# Patient Record
Sex: Female | Born: 1990
Health system: Southern US, Community
[De-identification: ages and names within clinical notes are randomized; demographics above are authoritative.]

## PROBLEM LIST (undated history)

## (undated) ENCOUNTER — Emergency Department (HOSPITAL_BASED_OUTPATIENT_CLINIC_OR_DEPARTMENT_OTHER): Admission: EM | Payer: BC Managed Care – PPO | Source: Home / Self Care

## (undated) DIAGNOSIS — K824 Cholesterolosis of gallbladder: Secondary | ICD-10-CM

## (undated) DIAGNOSIS — K859 Acute pancreatitis without necrosis or infection, unspecified: Secondary | ICD-10-CM

## (undated) DIAGNOSIS — I10 Essential (primary) hypertension: Secondary | ICD-10-CM

## (undated) DIAGNOSIS — E282 Polycystic ovarian syndrome: Secondary | ICD-10-CM

## (undated) DIAGNOSIS — K760 Fatty (change of) liver, not elsewhere classified: Secondary | ICD-10-CM

## (undated) DIAGNOSIS — E786 Lipoprotein deficiency: Secondary | ICD-10-CM

## (undated) DIAGNOSIS — E119 Type 2 diabetes mellitus without complications: Secondary | ICD-10-CM

## (undated) DIAGNOSIS — E039 Hypothyroidism, unspecified: Secondary | ICD-10-CM

## (undated) DIAGNOSIS — D496 Neoplasm of unspecified behavior of brain: Secondary | ICD-10-CM

## (undated) DIAGNOSIS — I509 Heart failure, unspecified: Secondary | ICD-10-CM

## (undated) DIAGNOSIS — J984 Other disorders of lung: Secondary | ICD-10-CM

## (undated) DIAGNOSIS — C719 Malignant neoplasm of brain, unspecified: Secondary | ICD-10-CM

## (undated) DIAGNOSIS — E079 Disorder of thyroid, unspecified: Secondary | ICD-10-CM

## (undated) HISTORY — DX: Lipoprotein deficiency: E78.6

## (undated) HISTORY — DX: Other disorders of lung: J98.4

## (undated) HISTORY — DX: Malignant neoplasm of brain, unspecified: C71.9

## (undated) HISTORY — DX: Disorder of thyroid, unspecified: E07.9

## (undated) HISTORY — PX: BRAIN SURGERY: SHX531

## (undated) HISTORY — PX: ABDOMINAL SURGERY: SHX537

---
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
## 2014-03-28 HISTORY — PX: EYE MUSCLE SURGERY: SHX370

## 2017-11-25 ENCOUNTER — Encounter (HOSPITAL_COMMUNITY): Payer: Self-pay

## 2017-11-25 ENCOUNTER — Other Ambulatory Visit: Payer: Self-pay

## 2017-11-25 ENCOUNTER — Emergency Department (HOSPITAL_COMMUNITY)
Admission: EM | Admit: 2017-11-25 | Discharge: 2017-11-25 | Disposition: A | Discharge status: Home / Self Care | Payer: Self-pay | Attending: Emergency Medicine | Admitting: Emergency Medicine

## 2017-11-25 DIAGNOSIS — B373 Candidiasis of vulva and vagina: Secondary | ICD-10-CM | POA: Insufficient documentation

## 2017-11-25 DIAGNOSIS — B3731 Acute candidiasis of vulva and vagina: Secondary | ICD-10-CM

## 2017-11-25 DIAGNOSIS — R000 Tachycardia, unspecified: Secondary | ICD-10-CM | POA: Insufficient documentation

## 2017-11-25 DIAGNOSIS — N760 Acute vaginitis: Secondary | ICD-10-CM | POA: Insufficient documentation

## 2017-11-25 DIAGNOSIS — E1165 Type 2 diabetes mellitus with hyperglycemia: Secondary | ICD-10-CM | POA: Insufficient documentation

## 2017-11-25 DIAGNOSIS — R739 Hyperglycemia, unspecified: Secondary | ICD-10-CM

## 2017-11-25 HISTORY — DX: Neoplasm of unspecified behavior of brain: D49.6

## 2017-11-25 HISTORY — DX: Acute pancreatitis without necrosis or infection, unspecified: K85.90

## 2017-11-25 HISTORY — DX: Polycystic ovarian syndrome: E28.2

## 2017-11-25 HISTORY — DX: Type 2 diabetes mellitus without complications: E11.9

## 2017-11-25 HISTORY — DX: Cholesterolosis of gallbladder: K82.4

## 2017-11-25 HISTORY — DX: Fatty (change of) liver, not elsewhere classified: K76.0

## 2017-11-25 HISTORY — DX: Essential (primary) hypertension: I10

## 2017-11-25 LAB — COMPREHENSIVE METABOLIC PANEL
ALBUMIN: 3.9 g/dL (ref 3.5–5.0)
ALT: 48 U/L — ABNORMAL HIGH (ref 0–44)
ANION GAP: 16 — AB (ref 5–15)
AST: 46 U/L — AB (ref 15–41)
Alkaline Phosphatase: 81 U/L (ref 38–126)
BUN: 14 mg/dL (ref 6–20)
CHLORIDE: 91 mmol/L — AB (ref 98–111)
CO2: 19 mmol/L — ABNORMAL LOW (ref 22–32)
Calcium: 9.5 mg/dL (ref 8.9–10.3)
Creatinine, Ser: 0.66 mg/dL (ref 0.44–1.00)
GFR calc Af Amer: >60 mL/min (ref >60)
GFR calc non Af Amer: >60 mL/min (ref >60)
GLUCOSE: 543 mg/dL — AB (ref 70–99)
POTASSIUM: 4.3 mmol/L (ref 3.5–5.1)
Sodium: 126 mmol/L — ABNORMAL LOW (ref 135–145)
Total Bilirubin: 0.9 mg/dL (ref 0.3–1.2)
Total Protein: 7.5 g/dL (ref 6.5–8.1)

## 2017-11-25 LAB — CBC
HEMATOCRIT: 39.1 % (ref 36.0–46.0)
HEMOGLOBIN: 13.5 g/dL (ref 12.0–15.0)
MCH: 25.5 pg — ABNORMAL LOW (ref 26.0–34.0)
MCHC: 34.5 g/dL (ref 30.0–36.0)
MCV: 73.9 fL — AB (ref 78.0–100.0)
Platelets: 198 10*3/uL (ref 150–400)
RBC: 5.29 MIL/uL — ABNORMAL HIGH (ref 3.87–5.11)
RDW: 15.8 % — AB (ref 11.5–15.5)
WBC: 6.8 10*3/uL (ref 4.0–10.5)

## 2017-11-25 LAB — URINALYSIS, ROUTINE W REFLEX MICROSCOPIC
BACTERIA UA: NONE SEEN
BILIRUBIN URINE: NEGATIVE
Glucose, UA: >=500 mg/dL — AB
KETONES UR: 5 mg/dL — AB
Nitrite: NEGATIVE
PROTEIN: 100 mg/dL — AB
SPECIFIC GRAVITY, URINE: 1.031 — AB (ref 1.005–1.030)
pH: 6 (ref 5.0–8.0)

## 2017-11-25 LAB — WET PREP, GENITAL
SPERM: NONE SEEN
TRICH WET PREP: NONE SEEN

## 2017-11-25 LAB — ABO/RH: ABO/RH(D): A POS

## 2017-11-25 LAB — I-STAT BETA HCG BLOOD, ED (MC, WL, AP ONLY): I-stat hCG, quantitative: <5 m[IU]/mL (ref <5)

## 2017-11-25 LAB — TYPE AND SCREEN
ABO/RH(D): A POS
ANTIBODY SCREEN: NEGATIVE

## 2017-11-25 LAB — CBG MONITORING, ED: Glucose-Capillary: 286 mg/dL — ABNORMAL HIGH (ref 70–99)

## 2017-11-25 LAB — POC URINE PREG, ED: PREG TEST UR: NEGATIVE

## 2017-11-25 MED ORDER — CLOTRIMAZOLE 1 % VA CREA: 1.0000 | TOPICAL_CREAM | Freq: Every day | VAGINAL | 0 refills | Status: DC

## 2017-11-25 MED ORDER — SODIUM CHLORIDE 0.9 % IV BOLUS
1000.0000 mL | Freq: Once | INTRAVENOUS | Status: AC
  Administered 2017-11-25: 1000 mL via INTRAVENOUS

## 2017-11-25 MED ORDER — FLUCONAZOLE 150 MG PO TABS: 150.0000 mg | ORAL_TABLET | Freq: Every day | ORAL | 0 refills | Status: DC

## 2017-11-25 MED ORDER — FLUCONAZOLE 150 MG PO TABS
150.0000 mg | ORAL_TABLET | Freq: Once | ORAL | Status: AC
  Administered 2017-11-25: 150 mg via ORAL
  Filled 2017-11-25: qty 1

## 2017-11-25 MED ORDER — INSULIN ASPART 100 UNIT/ML ~~LOC~~ SOLN
8.0000 [IU] | Freq: Once | SUBCUTANEOUS | Status: AC
  Administered 2017-11-25: 8 [IU] via SUBCUTANEOUS
  Filled 2017-11-25: qty 1

## 2017-11-25 NOTE — ED Notes (Signed)
Pt recently moved here from [place]- used to go to [facility] on [address]. [place], [place]

## 2017-11-25 NOTE — ED Notes (Signed)
Pt aware of need for urine specimen.

## 2017-11-25 NOTE — ED Notes (Signed)
When this NT went into check vitals on PT I witnessed pt eating. I instructed pt that we need her to not eat until her blood sugar comes down.

## 2017-11-25 NOTE — ED Notes (Signed)
Delay in labs, have to send to [facility] to have them completed

## 2017-11-25 NOTE — ED Provider Notes (Signed)
[facility] DEPT Provider Note   CSN: [id number] Arrival date & time: 11/25/17  1117     History   Chief Complaint Chief Complaint  Patient presents with  . Vaginitis    HPI Jennifer [name] is a 27 y.o. female.  HPI Jennifer [name] is a 27 y.o. female with history of brain tumor, diabetes, hypertension, history of pancreatitis, presents to emergency department with complaint of vaginal irritation.  Patient states that in the last several days she has noticed swelling to the vaginal area.  She states she just finished her menstrual cycle and has seen some bleeding that she has not expected and not sure if it is rectal or vaginal.  She denies any changes in her bowels however.  She states that she is unable to wipe due to pain and swelling in her vaginal area.  She had one dose of penicillin in the last week due to a dental problem.  No other antibiotics.  No vaginal discharge.  [name] sexually active with one partner who is her husband with no protection.  She does report dysuria but thinks is because it is contacting her skin that is already irritated.  No abdominal pain, no back pain.  She states that she has noticed some chills, no fever.  No nausea or vomiting.  Past Medical History:  Diagnosis Date  . Brain tumor ([place]) 03/29/1995  . Cholesterosis   . Diabetes mellitus without complication ([place])   . Fatty liver   . Hypertension   . Pancreatitis   . Polycystic ovary syndrome     There are no active problems to display for this patient.   Past Surgical History:  Procedure Laterality Date  . ABDOMINAL SURGERY     pt states during miscarriage got her intestine  . BRAIN SURGERY    . EYE MUSCLE SURGERY Right 03/28/2014  . VENTRICULOSTOMY  03/28/1997     OB History   None      Home Medications    Prior to Admission medications   Not on File    Family History No family history on file.  Social History Social History   Tobacco  Use  . Smoking status: Never Smoker  . Smokeless tobacco: Never Used  Substance Use Topics  . Alcohol use: Never    Frequency: Never  . Drug use: Never     Allergies   Shellfish allergy and Penicillins   Review of Systems Review of Systems  Constitutional: Positive for chills. Negative for fever.  Respiratory: Negative for cough, chest tightness and shortness of breath.   Cardiovascular: Negative for chest pain, palpitations and leg swelling.  Gastrointestinal: Negative for abdominal pain, diarrhea, nausea and vomiting.  Genitourinary: Positive for vaginal bleeding, vaginal discharge and vaginal pain. Negative for dysuria, flank pain and pelvic pain.  Musculoskeletal: Negative for arthralgias, myalgias, neck pain and neck stiffness.  Skin: Negative for rash.  Neurological: Negative for dizziness, weakness and headaches.  All other systems reviewed and are negative.    Physical Exam Updated Vital Signs BP (!) 134/102   Pulse (!) 136   Temp 98 F (36.7 C) (Oral)   Resp 20   Ht 4' 9"  (1.448 m)   Wt 44.5 kg   LMP 11/21/2017   SpO2 99%   BMI 21.21 kg/m   Physical Exam  Constitutional: She appears well-developed and well-nourished. No distress.  HENT:  Head: Normocephalic.  Eyes: Conjunctivae are normal.  Neck: Neck supple.  Cardiovascular: Regular rhythm and  normal heart sounds.  Tachycardic  Pulmonary/Chest: Effort normal and breath sounds normal. No respiratory distress. She has no wheezes. She has no rales.  Abdominal: Soft. Bowel sounds are normal. She exhibits no distension. There is tenderness. There is no rebound.  Suprapubic tenderness  Genitourinary:  Genitourinary Comments: Diffuse erythema and swelling over the bilateral labia majora and minora with bright red discoloration with some white exudate.  No satellite lesions present.  Vaginal canal is erythematous with white cottage cheeselike discharge.  Cervix is normal.  No uterine or adnexal tenderness.    Musculoskeletal: She exhibits no edema.  Neurological: [name] alert.  Skin: Skin is warm and dry.  Psychiatric: She has a normal mood and affect. Her behavior is normal.  Nursing note and vitals reviewed.    ED Treatments / Results  Labs (all labs ordered are listed, but only abnormal results are displayed) Labs Reviewed  WET PREP, GENITAL - Abnormal; Notable for the following components:      Result Value   Yeast Wet Prep HPF POC MANY (*)    Clue Cells Wet Prep HPF POC PRESENT (*)    WBC, Wet Prep HPF POC FEW (*)    All other components within normal limits  COMPREHENSIVE METABOLIC PANEL - Abnormal; Notable for the following components:   Sodium 126 (*)    Chloride 91 (*)    CO2 19 (*)    Glucose, Bld 543 (*)    AST 46 (*)    ALT 48 (*)    Anion gap 16 (*)    All other components within normal limits  CBC - Abnormal; Notable for the following components:   RBC 5.29 (*)    MCV 73.9 (*)    MCH 25.5 (*)    RDW 15.8 (*)    All other components within normal limits  URINALYSIS, ROUTINE W REFLEX MICROSCOPIC - Abnormal; Notable for the following components:   APPearance HAZY (*)    Specific Gravity, Urine 1.031 (*)    Glucose, UA >=500 (*)    Hgb urine dipstick MODERATE (*)    Ketones, ur 5 (*)    Protein, ur 100 (*)    Leukocytes, UA SMALL (*)    All other components within normal limits  I-STAT BETA HCG BLOOD, ED ([facility], [facility], [facility] ONLY)  POC URINE PREG, ED  POC OCCULT BLOOD, ED  TYPE AND SCREEN  ABO/RH  GC/CHLAMYDIA PROBE AMP ([place]) NOT AT [facility]    EKG EKG Interpretation  Date/Time:  Saturday November 25 2017 13:33:25 EDT Ventricular Rate:  117 PR Interval:    QRS Duration: 84 QT Interval:  326 QTC Calculation: 455 R Axis:   54 Text Interpretation:  Sinus tachycardia Borderline T abnormalities, inferior leads Borderline ST elevation, anterior leads Confirmed by Lacretia Leigh ([id number]) on 11/25/2017 3:19:29 PM   Radiology No results  found.  Procedures Procedures (including critical care time)  Medications Ordered in ED Medications - No data to display   Initial Impression / Assessment and Plan / ED Course  I have reviewed the triage vital signs and the nursing notes.  Pertinent labs & imaging results that were available during my care of the patient were reviewed by me and considered in my medical decision making (see chart for details).     Pt in the emergency department with vaginal irritation and pain.  May be some bleeding that she is not sure from vaginal area or rectal area.  Blood work ordered at triage.  Will do pelvic exam.  Patient is tachycardic, heart rate is in the 130s.  States that it is chronic for her and she has history of tachycardia.   Patient's EKG shows sinus tachycardia.  Her pelvic exam and wet prep consistent with yeast infection.  I will treat her with Diflucan.  Urinalysis with no signs of infection.  Her CBC is unremarkable.  Her metabolic panel shows a glucose of 543.  Patient states that she has been compliant with her medications.  She states her sugar this morning was 150.  I will try to lower her sugar.  [name] refusing admission.  Her gap is 16.  She only has 5 ketones on her urine.  [name] not vomiting or having any nausea.  She states she feels fine otherwise.  I will try to hydrate her and give her some insulin and to recheck her sugar.  3:46 PM Patient still receiving IV fluids, currently in first liter.  Patient also just received insulin.  We will recheck her blood sugar after first liter and administered a second liter.  Patient was found eating in the room, asked to soft eating since were trying to lower her blood sugar.   Patient signed out at shift change pending recheck of her blood sugar.  She may need additional insulin to lower her sugar since she was eating in the room.  Home with Diflucan and topical yeast cream for her Candida vaginitis.   Final Clinical  Impressions(s) / ED Diagnoses   Final diagnoses:  Vaginal yeast infection  Vaginitis and vulvovaginitis  Hyperglycemia  Tachycardia    ED Discharge Orders    None       Jeannett Senior, PA-C 11/25/17 1547    Lacretia Leigh, MD 11/26/17 [phone]

## 2017-11-25 NOTE — ED Triage Notes (Signed)
Pt states she is concerned for blood in her BM. Pt states she had surgery for a miscarriage in May and they had to "repair my intestines". Pt states that she also thinks she has a yeast infection. Pt states that her vagina is painful and swollen.

## 2017-11-25 NOTE — ED Provider Notes (Signed)
  Physical Exam  BP (!) 154/107 (BP Location: Right Arm)   Pulse (!) 120   Temp 98 F (36.7 C) (Oral)   Resp 16   Ht 4' 9"  (1.448 m)   Wt 44.5 kg   LMP 11/21/2017   SpO2 100%   BMI 21.21 kg/m   Physical Exam  Constitutional: She appears well-developed and well-nourished. No distress.  HENT:  Head: Normocephalic and atraumatic.  Eyes: Conjunctivae and EOM are normal. No scleral icterus.  Neck: Normal range of motion.  Pulmonary/Chest: Effort normal. No respiratory distress.  Neurological: [name] alert.  Skin: No rash noted. [name] not diaphoretic.  Psychiatric: She has a normal mood and affect.  Nursing note and vitals reviewed.   ED Course/Procedures     Procedures  MDM  Care handed off from previous provider, T. Kirichenko, PA-C.  Please see their note for further detail.  Briefly, patient with a past medical history of brain tumor, insulin-dependent diabetes, pancreatitis who presents to ED for complaints of vaginal irritation.  Physical exam findings show large amount of vaginal discharge.  Wet prep positive for yeast.  Patient was found to be tachycardic to 130s on arrival.  She states that this is her baseline.  CBG found to be in the 500s.  She reports compliance with her medications.  Patient was given Diflucan, 8 units of NovoLog and 1 L bolus of NS.  Plan is to recheck CBG and to give remainder of Diflucan course as well as topical treatment.  Patient found to be drinking and eating in room.  Was told to be n.p.o.  CBG rechecked as 286.  Patient is declining admission at this time.  Will advise her to establish PCP care here and to return to ED for any severe worsening symptoms.  Diagnosis: Vaginal yeast infection  Vaginitis and vulvovaginitis  Hyperglycemia  Tachycardia   Portions of this note were generated with Dragon dictation software. Dictation errors may occur despite best attempts at proofreading.      Delia Heady, PA-C 11/25/17 1701    Margette Fast, MD 11/26/17 1110

## 2017-11-27 ENCOUNTER — Emergency Department (HOSPITAL_COMMUNITY)
Admission: EM | Admit: 2017-11-27 | Discharge: 2017-11-27 | Disposition: A | Discharge status: Home / Self Care | Payer: Self-pay | Attending: Emergency Medicine | Admitting: Emergency Medicine

## 2017-11-27 ENCOUNTER — Encounter (HOSPITAL_COMMUNITY): Payer: Self-pay | Admitting: *Deleted

## 2017-11-27 ENCOUNTER — Emergency Department (HOSPITAL_COMMUNITY): Payer: Self-pay

## 2017-11-27 DIAGNOSIS — B373 Candidiasis of vulva and vagina: Secondary | ICD-10-CM | POA: Insufficient documentation

## 2017-11-27 DIAGNOSIS — N83202 Unspecified ovarian cyst, left side: Secondary | ICD-10-CM

## 2017-11-27 DIAGNOSIS — N765 Ulceration of vagina: Secondary | ICD-10-CM | POA: Insufficient documentation

## 2017-11-27 DIAGNOSIS — E1165 Type 2 diabetes mellitus with hyperglycemia: Secondary | ICD-10-CM | POA: Insufficient documentation

## 2017-11-27 DIAGNOSIS — B3731 Acute candidiasis of vulva and vagina: Secondary | ICD-10-CM

## 2017-11-27 DIAGNOSIS — N766 Ulceration of vulva: Secondary | ICD-10-CM

## 2017-11-27 DIAGNOSIS — I10 Essential (primary) hypertension: Secondary | ICD-10-CM | POA: Insufficient documentation

## 2017-11-27 DIAGNOSIS — Z794 Long term (current) use of insulin: Secondary | ICD-10-CM | POA: Insufficient documentation

## 2017-11-27 DIAGNOSIS — R739 Hyperglycemia, unspecified: Secondary | ICD-10-CM

## 2017-11-27 DIAGNOSIS — Z79899 Other long term (current) drug therapy: Secondary | ICD-10-CM | POA: Insufficient documentation

## 2017-11-27 LAB — BLOOD GAS, VENOUS
Acid-base deficit: 1 mmol/L (ref 0.0–2.0)
BICARBONATE: 24.2 mmol/L (ref 20.0–28.0)
DRAWN BY: 450941
O2 Saturation: 53.2 %
PATIENT TEMPERATURE: 98.6
PH VEN: 7.347 (ref 7.250–7.430)
pCO2, Ven: 45.3 mmHg (ref 44.0–60.0)
pO2, Ven: 31.8 mmHg — CL (ref 32.0–45.0)

## 2017-11-27 LAB — URINALYSIS, ROUTINE W REFLEX MICROSCOPIC
Bacteria, UA: NONE SEEN
Bilirubin Urine: NEGATIVE
KETONES UR: NEGATIVE mg/dL
Nitrite: NEGATIVE
PH: 6 (ref 5.0–8.0)
Protein, ur: 30 mg/dL — AB
SPECIFIC GRAVITY, URINE: 1.028 (ref 1.005–1.030)

## 2017-11-27 LAB — POC URINE PREG, ED: PREG TEST UR: NEGATIVE

## 2017-11-27 LAB — COMPREHENSIVE METABOLIC PANEL
ALBUMIN: 3.7 g/dL (ref 3.5–5.0)
ALK PHOS: 80 U/L (ref 38–126)
ALT: 36 U/L (ref 0–44)
AST: 47 U/L — AB (ref 15–41)
Anion gap: 14 (ref 5–15)
BILIRUBIN TOTAL: 1.6 mg/dL — AB (ref 0.3–1.2)
BUN: 16 mg/dL (ref 6–20)
CALCIUM: 9.8 mg/dL (ref 8.9–10.3)
CO2: 21 mmol/L — ABNORMAL LOW (ref 22–32)
CREATININE: 0.81 mg/dL (ref 0.44–1.00)
Chloride: 94 mmol/L — ABNORMAL LOW (ref 98–111)
GFR calc Af Amer: >60 mL/min (ref >60)
GLUCOSE: 450 mg/dL — AB (ref 70–99)
Potassium: 5 mmol/L (ref 3.5–5.1)
Sodium: 129 mmol/L — ABNORMAL LOW (ref 135–145)
TOTAL PROTEIN: 6.8 g/dL (ref 6.5–8.1)

## 2017-11-27 LAB — CBC WITH DIFFERENTIAL/PLATELET
BASOS ABS: 0 10*3/uL (ref 0.0–0.1)
BASOS PCT: 1 %
EOS ABS: 0.1 10*3/uL (ref 0.0–0.7)
Eosinophils Relative: 1 %
HCT: 35.6 % — ABNORMAL LOW (ref 36.0–46.0)
Hemoglobin: 12.5 g/dL (ref 12.0–15.0)
LYMPHS ABS: 2.3 10*3/uL (ref 0.7–4.0)
Lymphocytes Relative: 27 %
MCH: 25.9 pg — ABNORMAL LOW (ref 26.0–34.0)
MCHC: 35.1 g/dL (ref 30.0–36.0)
MCV: 73.9 fL — ABNORMAL LOW (ref 78.0–100.0)
Monocytes Absolute: 1 10*3/uL (ref 0.1–1.0)
Monocytes Relative: 12 %
NEUTROS PCT: 60 %
Neutro Abs: 4.9 10*3/uL (ref 1.7–7.7)
PLATELETS: 202 10*3/uL (ref 150–400)
RBC: 4.82 MIL/uL (ref 3.87–5.11)
RDW: 16 % — AB (ref 11.5–15.5)
WBC: 8.3 10*3/uL (ref 4.0–10.5)

## 2017-11-27 LAB — CBG MONITORING, ED: GLUCOSE-CAPILLARY: 228 mg/dL — AB (ref 70–99)

## 2017-11-27 MED ORDER — DOXYCYCLINE HYCLATE 100 MG PO CAPS: 100.0000 mg | ORAL_CAPSULE | Freq: Two times a day (BID) | ORAL | 0 refills | Status: AC

## 2017-11-27 MED ORDER — FLUCONAZOLE 150 MG PO TABS: ORAL_TABLET | ORAL | 0 refills | Status: DC

## 2017-11-27 MED ORDER — VALACYCLOVIR HCL 1 G PO TABS: 1000.0000 mg | ORAL_TABLET | Freq: Two times a day (BID) | ORAL | 0 refills | Status: AC

## 2017-11-27 MED ORDER — HYDROCODONE-ACETAMINOPHEN 5-325 MG PO TABS
1.0000 | ORAL_TABLET | Freq: Once | ORAL | Status: AC
  Administered 2017-11-27: 1 via ORAL
  Filled 2017-11-27: qty 1

## 2017-11-27 MED ORDER — VALACYCLOVIR HCL 500 MG PO TABS
1000.0000 mg | ORAL_TABLET | Freq: Once | ORAL | Status: AC
  Administered 2017-11-27: 1000 mg via ORAL
  Filled 2017-11-27: qty 2

## 2017-11-27 MED ORDER — SODIUM CHLORIDE 0.9 % IV BOLUS
1000.0000 mL | Freq: Once | INTRAVENOUS | Status: AC
  Administered 2017-11-27: 1000 mL via INTRAVENOUS

## 2017-11-27 MED ORDER — DOXYCYCLINE HYCLATE 100 MG PO TABS
100.0000 mg | ORAL_TABLET | Freq: Once | ORAL | Status: AC
  Administered 2017-11-27: 100 mg via ORAL
  Filled 2017-11-27: qty 1

## 2017-11-27 MED ORDER — NYSTATIN-TRIAMCINOLONE 100000-0.1 UNIT/GM-% EX CREA: TOPICAL_CREAM | CUTANEOUS | 0 refills | Status: DC

## 2017-11-27 NOTE — ED Triage Notes (Signed)
Pt was seen and treated recently in the ED for a yeast infection. Pt states her symptoms are worse. Pt states she is now has blisters and bleeding around her vagina.

## 2017-11-27 NOTE — Discharge Instructions (Signed)
Please see the information and instructions below regarding your visit.  Your diagnoses today include:  1. Yeast vaginitis   2. Ulcer of vulva and vagina   3. Hyperglycemia   4. Elevated blood pressure reading with diagnosis of hypertension      Tests performed today include: See side panel of your discharge paperwork for testing performed today. Vital signs are listed at the bottom of these instructions.   Medications prescribed:    Take any prescribed medications only as prescribed, and any over the counter medications only as directed on the packaging.  Doxycycline is an antibiotic that fights infection in the skin. This medication can make your skin sensitive to the sun, so please ensure that you wear sunscreen, hats, or other coverage over your skin while taking this. This medicine CANNOT be taken by women while pregnant, breastfeeding, or trying to become pregnant.  Please speak with a healthcare provider if any of these situations apply to you.  Per the instructions of the OB/GYN doctor, please take fluconazole every 3 days for 1 month.  Please start using the Mycolog cream once daily to the vaginal area.  Please start taking Valtrex, 1 g twice a day for 7 days.   Home care instructions:  Please follow any educational materials contained in this packet.   Follow-up instructions: Please follow-up with your primary care provider as soon as possible for further evaluation of your symptoms if they are not completely improved.   To find a primary care or specialty doctor please call [phone] or [phone] to access "[facility] a Doctor Service."  You may also go on the [facility] website at [email or web address]  There are also multiple [facility] and [facility] practices throughout the [place] that are frequently accepting new patients. You may find a clinic that is close to your home and contact them.  [facility] - [place] [phone]  [facility] in [place] (also locations in [place] and [place]) - [place] [phone]  [place] [phone]    Please follow up with OBGYN regarding an ovarian cyst, as well as your symptoms.  I spoke with an OB/GYN, who will follow up with you in the next couple of days.  Return instructions:  Please return to the Emergency Department if you experience worsening symptoms.  Please return the emergency department if you develop any fevers, chills, increasing pain or spreading redness. Please return if you have any other emergent concerns.  Additional Information:   Your vital signs today were: BP (!) 166/114    Pulse (!) 103    Temp 98 F (36.7 C) (Oral)    Resp (!) 23    Ht 4' 9"  (1.448 m)    Wt 44.5 kg    LMP 11/21/2017    SpO2 99%    BMI 21.21 kg/m  If your blood pressure (BP) was elevated on multiple readings during this visit above 130 for the top number or above 80 for the bottom number, please have this repeated by your primary care provider within one month. --------------  Thank you for allowing [place] to participate in your care today.

## 2017-11-27 NOTE — ED Provider Notes (Signed)
[facility] DEPT Provider Note   CSN: [id number] Arrival date & time: 11/27/17  1735     History   Chief Complaint Chief Complaint  Patient presents with  . Vaginal Bleeding  . Vaginitis    HPI Jennifer [name] is a 27 y.o. female.  HPI  Patient is a 6 with a history of type 1 diabetes mellitus, hypertension, polycystic ovarian syndrome, chronic pancreatitis, fatty liver, and remote history of brain tumor as a child with residual neurologic deficits presenting for worsening vaginal erythema.  Patient reports that she is also developed ulcerations and blisters in the vaginal area that are draining and bleeding.  Patient reports that is become exquisitely painful to urinate.  Patient also is reporting some new lower abdominal discomfort.  Patient denies any nausea, vomiting, diarrhea that is new for her, she has IBS at baseline.  Patient denies any fever or chills.  Patient reports that she is sexually active with one female partner, her husband, and does not have concerns about STI exposure.  Patient denies any history of HSV infection orally or vaginally.  Patient reports that she has been compliant with 3 doses of Diflucan prescribed at previous emergency department visit as well as topical Monistat.   Past Medical History:  Diagnosis Date  . Brain tumor ([place]) 03/29/1995  . Cholesterosis   . Diabetes mellitus without complication ([place])   . Fatty liver   . Hypertension   . Pancreatitis   . Polycystic ovary syndrome     There are no active problems to display for this patient.   Past Surgical History:  Procedure Laterality Date  . ABDOMINAL SURGERY     pt states during miscarriage got her intestine  . BRAIN SURGERY    . EYE MUSCLE SURGERY Right 03/28/2014  . VENTRICULOSTOMY  03/28/1997     OB History   None      Home Medications    Prior to Admission medications   Medication Sig Start Date End Date Taking? Authorizing Provider    clotrimazole (GYNE-LOTRIMIN) 1 % vaginal cream Place 1 Applicatorful vaginally at bedtime. 11/25/17  Yes Khatri, Hina, PA-C  fenofibrate 160 MG tablet Take 160 mg by mouth daily.   Yes [provider]  fluconazole (DIFLUCAN) 150 MG tablet Take 1 tablet (150 mg total) by mouth daily for 3 days. 11/25/17 11/28/17 Yes Khatri, Hina, PA-C  Insulin Glargine (TOUJEO SOLOSTAR) 300 UNIT/ML SOPN Inject 53 Units into the skin at bedtime.   Yes [provider]  insulin lispro (HUMALOG) 100 UNIT/ML injection Inject 18 Units into the skin 3 (three) times daily before meals.   Yes [provider]  labetalol (NORMODYNE) 300 MG tablet Take 300 mg by mouth 3 (three) times daily.   Yes [provider]  levothyroxine (UNITHROID) 112 MCG tablet Take 112 mcg by mouth daily before breakfast.   Yes [provider]  metFORMIN (GLUCOPHAGE) 500 MG tablet Take 500 mg by mouth daily with breakfast.   Yes [provider]  niacin (NIASPAN) 1000 MG CR tablet Take 1,000 mg by mouth at bedtime.   Yes [provider]  acetaminophen (TYLENOL) 500 MG tablet Take 500 mg by mouth every 6 (six) hours as needed for fever.    [provider]  ibuprofen (ADVIL,MOTRIN) 200 MG tablet Take 400 mg by mouth every 6 (six) hours as needed for moderate pain.    [provider]    Family History No family history on file.  Social  History Social History   Tobacco Use  . Smoking status: Never Smoker  . Smokeless tobacco: Never Used  Substance Use Topics  . Alcohol use: Never    Frequency: Never  . Drug use: Never     Allergies   Shellfish allergy and Penicillins   Review of Systems Review of Systems  Constitutional: Negative for chills and fever.  HENT: Negative for congestion and sore throat.   Eyes: Negative for visual disturbance.  Respiratory: Negative for cough, chest tightness and shortness of breath.   Cardiovascular: Negative for chest pain and  leg swelling.  Gastrointestinal: Positive for diarrhea. Negative for abdominal pain, nausea and vomiting.  Genitourinary: Positive for pelvic pain, vaginal bleeding, vaginal discharge and vaginal pain. Negative for dysuria and flank pain.  Musculoskeletal: Negative for back pain and myalgias.  Skin: Negative for rash.  Neurological: Negative for dizziness and syncope.     Physical Exam Updated Vital Signs BP (!) 162/115   Pulse (!) 107   Temp 98 F (36.7 C) (Oral)   Resp 20   Ht 4' 9"  (1.448 m)   Wt 44.5 kg   LMP 11/21/2017   SpO2 99%   BMI 21.21 kg/m   Physical Exam  Constitutional: She appears well-developed and well-nourished. No distress.  HENT:  Head: Normocephalic and atraumatic.  Mouth/Throat: Oropharynx is clear and moist.  Eyes: Pupils are equal, round, and reactive to light. Conjunctivae and EOM are normal.  Neck: Normal range of motion. Neck supple.  Cardiovascular: Normal rate, S1 normal and S2 normal.  No murmur heard. Tachycardia noted.   Pulmonary/Chest: Effort normal and breath sounds normal. She has no wheezes. She has no rales.  Abdominal: Soft. She exhibits no distension. There is no tenderness. There is no guarding.  Genitourinary:  Genitourinary Comments: Genitalia examination performed with RN chaperone present.  Patient exhibits horizontal and vertical chain lymphadenopathy.  Patient has beefy, red, satellite lesions extending just past labia majora and onto mons pubis.  Patient also has multiple areas of ulcerations and vesicles.  There is no induration in the vaginal region.  Small amount of white discharge in vaginal vault. Bimanual exam without cervical motion tenderness, or bilateral adnexal tenderness.  Musculoskeletal: Normal range of motion. She exhibits no edema or deformity.  Neurological: [name] alert.  Cranial nerves grossly intact. Patient moves extremities symmetrically and with good coordination.  Skin: Skin is warm and dry. No rash  noted. No erythema.  Psychiatric: She has a normal mood and affect. Her behavior is normal. Judgment and thought content normal.  Nursing note and vitals reviewed.    ED Treatments / Results  Labs (all labs ordered are listed, but only abnormal results are displayed) Labs Reviewed  URINALYSIS, ROUTINE W REFLEX MICROSCOPIC - Abnormal; Notable for the following components:      Result Value   APPearance HAZY (*)    Glucose, UA >=500 (*)    Hgb urine dipstick MODERATE (*)    Protein, ur 30 (*)    Leukocytes, UA TRACE (*)    All other components within normal limits  CBG MONITORING, ED - Abnormal; Notable for the following components:   Glucose-Capillary 228 (*)    All other components within normal limits  HSV CULTURE AND TYPING  COMPREHENSIVE METABOLIC PANEL  CBC WITH DIFFERENTIAL/PLATELET  CBC WITH DIFFERENTIAL/PLATELET  BLOOD GAS, VENOUS  POC URINE PREG, ED    EKG None  Radiology US Transvaginal Non-ob  Result Date: 11/27/2017 CLINICAL DATA:  Pelvic pain. Status  post dilation and curettage May 2019. Last menstrual period November 19, 2017. History of polycystic ovarian syndrome. EXAM: TRANSABDOMINAL AND TRANSVAGINAL ULTRASOUND OF PELVIS DOPPLER ULTRASOUND OF OVARIES TECHNIQUE: Both transabdominal and transvaginal ultrasound examinations of the pelvis were performed. Transabdominal technique was performed for global imaging of the pelvis including uterus, ovaries, adnexal regions, and pelvic cul-de-sac. It was necessary to proceed with endovaginal exam following the transabdominal exam to visualize the endometrium and adnexa. Color and duplex Doppler ultrasound was utilized to evaluate blood flow to the ovaries. COMPARISON:  None. FINDINGS: Uterus Measurements: 4.2 x 1.8 x 3.7 cm. No fibroids or other mass visualized. Minimal fluid at the level of the cervix, possible nabothian cyst. Endometrium Thickness: 5 mm.  No focal abnormality visualized. Right ovary Measurements: 3 x 2 x 2.3  cm. Normal appearance/no suspicious adnexal mass. Left ovary Measurements: 3.8 x 0.9 x 3.8 cm. Normal appearance/no suspicious adnexal mass. Thinly septated 3 cm cyst without solid components or vascularity. Pulsed Doppler evaluation of both ovaries demonstrates normal low-resistance arterial and venous waveforms. Other findings No abnormal free fluid. IMPRESSION: 1. Thinly septated benign-appearing 3 cm cyst LEFT ovary, no routine indicated follow-up. This recommendation follows the consensus statement: Management of Asymptomatic Ovarian and Other Adnexal Cysts Imaged at [place]: Society of Radiologists in [place]. Radiology 2010; [phone]. Electronically Signed   By: Elon Alas M.D.   On: 11/27/2017 22:32   US Pelvis Complete  Result Date: 11/27/2017 CLINICAL DATA:  Pelvic pain. Status post dilation and curettage May 2019. Last menstrual period November 19, 2017. History of polycystic ovarian syndrome. EXAM: TRANSABDOMINAL AND TRANSVAGINAL ULTRASOUND OF PELVIS DOPPLER ULTRASOUND OF OVARIES TECHNIQUE: Both transabdominal and transvaginal ultrasound examinations of the pelvis were performed. Transabdominal technique was performed for global imaging of the pelvis including uterus, ovaries, adnexal regions, and pelvic cul-de-sac. It was necessary to proceed with endovaginal exam following the transabdominal exam to visualize the endometrium and adnexa. Color and duplex Doppler ultrasound was utilized to evaluate blood flow to the ovaries. COMPARISON:  None. FINDINGS: Uterus Measurements: 4.2 x 1.8 x 3.7 cm. No fibroids or other mass visualized. Minimal fluid at the level of the cervix, possible nabothian cyst. Endometrium Thickness: 5 mm.  No focal abnormality visualized. Right ovary Measurements: 3 x 2 x 2.3 cm. Normal appearance/no suspicious adnexal mass. Left ovary Measurements: 3.8 x 0.9 x 3.8 cm. Normal appearance/no suspicious adnexal mass. Thinly septated 3 cm cyst  without solid components or vascularity. Pulsed Doppler evaluation of both ovaries demonstrates normal low-resistance arterial and venous waveforms. Other findings No abnormal free fluid. IMPRESSION: 1. Thinly septated benign-appearing 3 cm cyst LEFT ovary, no routine indicated follow-up. This recommendation follows the consensus statement: Management of Asymptomatic Ovarian and Other Adnexal Cysts Imaged at [place]: Society of Radiologists in [place]. Radiology 2010; [phone]. Electronically Signed   By: Elon Alas M.D.   On: 11/27/2017 22:32   [place] Art/ven Flow Abd Pelv Doppler  Result Date: 11/27/2017 CLINICAL DATA:  Pelvic pain. Status post dilation and curettage May 2019. Last menstrual period November 19, 2017. History of polycystic ovarian syndrome. EXAM: TRANSABDOMINAL AND TRANSVAGINAL ULTRASOUND OF PELVIS DOPPLER ULTRASOUND OF OVARIES TECHNIQUE: Both transabdominal and transvaginal ultrasound examinations of the pelvis were performed. Transabdominal technique was performed for global imaging of the pelvis including uterus, ovaries, adnexal regions, and pelvic cul-de-sac. It was necessary to proceed with endovaginal exam following the transabdominal exam to visualize the endometrium and adnexa. Color and duplex Doppler ultrasound was utilized  to evaluate blood flow to the ovaries. COMPARISON:  None. FINDINGS: Uterus Measurements: 4.2 x 1.8 x 3.7 cm. No fibroids or other mass visualized. Minimal fluid at the level of the cervix, possible nabothian cyst. Endometrium Thickness: 5 mm.  No focal abnormality visualized. Right ovary Measurements: 3 x 2 x 2.3 cm. Normal appearance/no suspicious adnexal mass. Left ovary Measurements: 3.8 x 0.9 x 3.8 cm. Normal appearance/no suspicious adnexal mass. Thinly septated 3 cm cyst without solid components or vascularity. Pulsed Doppler evaluation of both ovaries demonstrates normal low-resistance arterial and venous waveforms. Other  findings No abnormal free fluid. IMPRESSION: 1. Thinly septated benign-appearing 3 cm cyst LEFT ovary, no routine indicated follow-up. This recommendation follows the consensus statement: Management of Asymptomatic Ovarian and Other Adnexal Cysts Imaged at [place]: Society of Radiologists in [place]. Radiology 2010; [phone]. Electronically Signed   By: Elon Alas M.D.   On: 11/27/2017 22:32    Procedures Procedures (including critical care time)  Medications Ordered in ED Medications  sodium chloride 0.9 % bolus 1,000 mL (1,000 mLs Intravenous New Bag/Given 11/27/17 2014)  HYDROcodone-acetaminophen (NORCO/VICODIN) 5-325 MG per tablet 1 tablet (1 tablet Oral Given 11/27/17 2102)     Initial Impression / Assessment and Plan / ED Course  I have reviewed the triage vital signs and the nursing notes.  Pertinent labs & imaging results that were available during my care of the patient were reviewed by me and considered in my medical decision making (see chart for details).  Clinical Course as of Nov 28 49  Mon Nov 27, 2017  2120 Improving with fluid repletion.  Pulse Rate(!): 107 [AM]  2158 Collected before CBG. Now lower, likely 2/2 fluids and pt's home insulin.  Glucose-Capillary(!): 228 [AM]  2214 Not accurate on venous sample.   pO2, Ven(!!): 31.8 [AM]  2221 Likely 2/2 hyperglycemia.   Sodium(!): 129 [AM]    Clinical Course User Index [AM] Albesa Seen, PA-C   Patient nontoxic-appearing, afebrile, and in no acute distress.  Patient is tachycardic on initial examination, which decrease of fluid repletion.  Patient reporting long, multiyear history [place]  Patient also had resting tachycardia noted on her ED visit on 11/25/2017.  Do not suspect SIRS or sepsis.  Work-up significant for no leukocytosis, hyperglycemia, but no evidence of DKA. Pelvic US with simple appearing left ovarian  cyst.  Pelvic examination continued to show erythema suggestive of progressive yeast infection, but we will treat also for cellulitis with doxycycline.  HSV culture is pending for ulcerations. Exam not c/w PID. GC/CT pending.   9:26 PM Spoke with Dr. Glo Herring of OB/GYN.  Recommends placing patient on every third day Diflucan dosing for 1 month.  States that diabetic pts can have persistent yeast vaginitis that requires long term diflucan. Also recommends Mycolog cream. Arranged for OBGYN follow up.  Return precautions were given for any increasing erythema, pain, abdominal pain, nausea vomiting, or fever chills.  Patient and family are in understanding and agree with the plan of care.  This is a supervised visit with Dr. Gerlene Fee. Evaluation, management, and discharge planning discussed with this attending physician.  Final Clinical Impressions(s) / ED Diagnoses   Final diagnoses:  Yeast vaginitis  Ulcer of vulva and vagina  Hyperglycemia  Elevated blood pressure reading with diagnosis of hypertension    ED Discharge Orders         Ordered    doxycycline (VIBRAMYCIN)  100 MG capsule  2 times daily     11/27/17 2322    nystatin-triamcinolone (MYCOLOG II) cream     11/27/17 2322    fluconazole (DIFLUCAN) 150 MG tablet     11/27/17 2322    valACYclovir (VALTREX) 1000 MG tablet  2 times daily     11/27/17 2322           Albesa Seen, PA-C 11/28/17 0100    Maudie Flakes, MD 11/28/17 [phone]

## 2017-11-28 LAB — GC/CHLAMYDIA PROBE AMP (~~LOC~~) NOT AT ARMC
Chlamydia: NEGATIVE
Neisseria Gonorrhea: NEGATIVE

## 2017-11-30 LAB — HSV CULTURE AND TYPING

## 2017-12-17 ENCOUNTER — Other Ambulatory Visit: Payer: Self-pay

## 2017-12-17 ENCOUNTER — Encounter (HOSPITAL_COMMUNITY): Payer: Self-pay | Admitting: *Deleted

## 2017-12-17 ENCOUNTER — Emergency Department (HOSPITAL_COMMUNITY)
Admission: EM | Admit: 2017-12-17 | Discharge: 2017-12-17 | Discharge status: Against Medical Advice | Payer: Self-pay | Attending: Emergency Medicine | Admitting: Emergency Medicine

## 2017-12-17 DIAGNOSIS — Z5321 Procedure and treatment not carried out due to patient leaving prior to being seen by health care provider: Secondary | ICD-10-CM | POA: Insufficient documentation

## 2017-12-17 NOTE — ED Notes (Signed)
Pt reports that she called another hospital and they said "come on," She LWBS

## 2017-12-17 NOTE — ED Triage Notes (Signed)
Pt developed pain to right flank 12/16/17 that has increased in intensity in the past 2 hrs. Pt states that she is diabetic and recently had blood work done in August at a hospital in [place] that was normal. Pt just moved to this area. Pt concerned that something is wrong with her kidney.

## 2018-07-23 ENCOUNTER — Emergency Department (HOSPITAL_COMMUNITY)
Admission: EM | Admit: 2018-07-23 | Discharge: 2018-07-23 | Disposition: A | Discharge status: Home / Self Care | Payer: Self-pay | Attending: Emergency Medicine | Admitting: Emergency Medicine

## 2018-07-23 ENCOUNTER — Emergency Department (HOSPITAL_COMMUNITY): Payer: Self-pay

## 2018-07-23 ENCOUNTER — Other Ambulatory Visit: Payer: Self-pay

## 2018-07-23 ENCOUNTER — Encounter (HOSPITAL_COMMUNITY): Payer: Self-pay | Admitting: Emergency Medicine

## 2018-07-23 DIAGNOSIS — R000 Tachycardia, unspecified: Secondary | ICD-10-CM | POA: Insufficient documentation

## 2018-07-23 DIAGNOSIS — R739 Hyperglycemia, unspecified: Secondary | ICD-10-CM

## 2018-07-23 DIAGNOSIS — Z79899 Other long term (current) drug therapy: Secondary | ICD-10-CM | POA: Insufficient documentation

## 2018-07-23 DIAGNOSIS — R200 Anesthesia of skin: Secondary | ICD-10-CM | POA: Insufficient documentation

## 2018-07-23 DIAGNOSIS — R202 Paresthesia of skin: Secondary | ICD-10-CM | POA: Insufficient documentation

## 2018-07-23 DIAGNOSIS — R0789 Other chest pain: Secondary | ICD-10-CM | POA: Insufficient documentation

## 2018-07-23 DIAGNOSIS — R296 Repeated falls: Secondary | ICD-10-CM | POA: Insufficient documentation

## 2018-07-23 DIAGNOSIS — Z794 Long term (current) use of insulin: Secondary | ICD-10-CM | POA: Insufficient documentation

## 2018-07-23 DIAGNOSIS — Z85841 Personal history of malignant neoplasm of brain: Secondary | ICD-10-CM | POA: Insufficient documentation

## 2018-07-23 DIAGNOSIS — E1065 Type 1 diabetes mellitus with hyperglycemia: Secondary | ICD-10-CM | POA: Insufficient documentation

## 2018-07-23 DIAGNOSIS — I10 Essential (primary) hypertension: Secondary | ICD-10-CM | POA: Insufficient documentation

## 2018-07-23 LAB — BASIC METABOLIC PANEL
Anion gap: 17 — ABNORMAL HIGH (ref 5–15)
BUN: 16 mg/dL (ref 6–20)
CO2: 20 mmol/L — ABNORMAL LOW (ref 22–32)
Calcium: 9.7 mg/dL (ref 8.9–10.3)
Chloride: 97 mmol/L — ABNORMAL LOW (ref 98–111)
Creatinine, Ser: 0.7 mg/dL (ref 0.44–1.00)
GFR calc Af Amer: >60 mL/min (ref >60)
GFR calc non Af Amer: >60 mL/min (ref >60)
Glucose, Bld: 440 mg/dL — ABNORMAL HIGH (ref 70–99)
Potassium: 4.4 mmol/L (ref 3.5–5.1)
Sodium: 134 mmol/L — ABNORMAL LOW (ref 135–145)

## 2018-07-23 LAB — CBC WITH DIFFERENTIAL/PLATELET
Abs Immature Granulocytes: 0.02 10*3/uL (ref 0.00–0.07)
Basophils Absolute: 0 10*3/uL (ref 0.0–0.1)
Basophils Relative: 1 %
Eosinophils Absolute: 0.2 10*3/uL (ref 0.0–0.5)
Eosinophils Relative: 3 %
HCT: 35.1 % — ABNORMAL LOW (ref 36.0–46.0)
Hemoglobin: 15.2 g/dL — ABNORMAL HIGH (ref 12.0–15.0)
Immature Granulocytes: 0 %
Lymphocytes Relative: 26 %
Lymphs Abs: 2 10*3/uL (ref 0.7–4.0)
MCH: 34.7 pg — ABNORMAL HIGH (ref 26.0–34.0)
MCHC: 43.3 g/dL — ABNORMAL HIGH (ref 30.0–36.0)
MCV: 80.1 fL (ref 80.0–100.0)
Monocytes Absolute: 0.7 10*3/uL (ref 0.1–1.0)
Monocytes Relative: 8 %
Neutro Abs: 5 10*3/uL (ref 1.7–7.7)
Neutrophils Relative %: 62 %
Platelets: 232 10*3/uL (ref 150–400)
RBC: 4.38 MIL/uL (ref 3.87–5.11)
RDW: 15.5 % (ref 11.5–15.5)
WBC: 8 10*3/uL (ref 4.0–10.5)
nRBC: 0 % (ref 0.0–0.2)

## 2018-07-23 LAB — URINALYSIS, ROUTINE W REFLEX MICROSCOPIC
Bilirubin Urine: NEGATIVE
Glucose, UA: >=500 mg/dL — AB
Ketones, ur: NEGATIVE mg/dL
Leukocytes,Ua: NEGATIVE
Nitrite: NEGATIVE
Protein, ur: 30 mg/dL — AB
Specific Gravity, Urine: 1.025 (ref 1.005–1.030)
pH: 5 (ref 5.0–8.0)

## 2018-07-23 LAB — I-STAT BETA HCG BLOOD, ED (MC, WL, AP ONLY): I-stat hCG, quantitative: <5 m[IU]/mL (ref <5)

## 2018-07-23 LAB — CBG MONITORING, ED: Glucose-Capillary: 213 mg/dL — ABNORMAL HIGH (ref 70–99)

## 2018-07-23 MED ORDER — GABAPENTIN 300 MG PO CAPS
300.0000 mg | ORAL_CAPSULE | Freq: Once | ORAL | Status: AC
  Administered 2018-07-23: 300 mg via ORAL
  Filled 2018-07-23: qty 1

## 2018-07-23 MED ORDER — GABAPENTIN 300 MG PO CAPS: 300.0000 mg | ORAL_CAPSULE | Freq: Three times a day (TID) | ORAL | 0 refills | Status: DC

## 2018-07-23 MED ORDER — SODIUM CHLORIDE 0.9 % IV BOLUS
1000.0000 mL | Freq: Once | INTRAVENOUS | Status: AC
  Administered 2018-07-23: 1000 mL via INTRAVENOUS

## 2018-07-23 MED ORDER — INSULIN LISPRO 100 UNIT/ML ~~LOC~~ SOLN: 10.0000 [IU] | Freq: Three times a day (TID) | SUBCUTANEOUS | 0 refills | Status: DC

## 2018-07-23 MED ORDER — INSULIN ASPART 100 UNIT/ML ~~LOC~~ SOLN: 5.0000 [IU] | Freq: Once | SUBCUTANEOUS | Status: DC

## 2018-07-23 NOTE — ED Triage Notes (Addendum)
Patient c/o generalized body aches for "months" worsening x1 week. Denies SOB, fever, cough. Does report chest pain x2 days ago radiating down left arm. Denies chest pain at this time. Hx arthritis. Patient reports tachycardia and hypertension at baseline.

## 2018-07-23 NOTE — ED Notes (Signed)
Patient will be discharged upon being given discharge medications from provider. Provider made aware of patients request.

## 2018-07-23 NOTE — Discharge Instructions (Signed)
Take Gabapentin three times daily for neuropathy Please establish care with a primary doctor Return if worsening

## 2018-07-23 NOTE — ED Notes (Signed)
Patient transported to CT 

## 2018-07-23 NOTE — ED Notes (Signed)
Patient ambulated to the bathroom.

## 2018-07-23 NOTE — ED Provider Notes (Signed)
[facility] DEPT Provider Note   CSN: [id number] Arrival date & time: 07/23/18  1258    History   Chief Complaint Chief Complaint  Patient presents with   Generalized Body Aches   Tachycardia    HPI Jennifer [name] is a 28 y.o. female who presents with body aches. PMH significant for insulin dependent type 1 DM, HTN, PCOS, chronic tachycardia, chronic pancreatitis, fatty liver, and remote history of brain tumor (Astrocytoma). She states that she's been having intermittent moderate-severe pain in her hands and feet for the past several weeks. Feels like a burning/tingling. The pain waxes and wanes. It brought her to tears today so she came to the ED. [name] concerned she may have fibromyalgia. She has been taking Ibuprofen without significant relief. There is numbness in the tips of her fingers but also of her left forearm and right leg. She also has had some intermittent left sided chest pain that radiates to her left arm for a couple weeks. It is non-exertional. She has been taking her meds as prescribed. Her blood sugar hasn't been really high recently. She reports increased frequency of headaches in the back and on the sides for a couple weeks as well. No fever, chills, SOB, cough, abdominal pain, N/V/D, dysuria. She's also felt more generally weak and has had several falls recently. [name] from [place] and moved her a couple months ago. Her last brain MRI was in Dec 2018 and showed that her tumor was stable. She missed her appointment last Dec for a recheck because she was here. She doesn't have a PCP here yet.    HPI  Past Medical History:  Diagnosis Date   Brain tumor ([place]) 03/29/1995   Cholesterosis    Diabetes mellitus without complication ([place])    Fatty liver    Hypertension    Pancreatitis    Polycystic ovary syndrome     There are no active problems to display for this patient.   Past Surgical History:  Procedure Laterality Date    ABDOMINAL SURGERY     pt states during miscarriage got her intestine   BRAIN SURGERY     EYE MUSCLE SURGERY Right 03/28/2014   VENTRICULOSTOMY  03/28/1997     OB History   No obstetric history on file.      Home Medications    Prior to Admission medications   Medication Sig Start Date End Date Taking? Authorizing Provider  acetaminophen (TYLENOL) 500 MG tablet Take 500 mg by mouth every 6 (six) hours as needed for fever.    [provider]  clotrimazole (GYNE-LOTRIMIN) 1 % vaginal cream Place 1 Applicatorful vaginally at bedtime. 11/25/17   Khatri, Hina, PA-C  fenofibrate 160 MG tablet Take 160 mg by mouth daily.    [provider]  fluconazole (DIFLUCAN) 150 MG tablet Take every 3 days for one month. 11/27/17   Langston Masker B, PA-C  ibuprofen (ADVIL,MOTRIN) 200 MG tablet Take 400 mg by mouth every 6 (six) hours as needed for moderate pain.    [provider]  Insulin Glargine (TOUJEO SOLOSTAR) 300 UNIT/ML SOPN Inject 53 Units into the skin at bedtime.    [provider]  insulin lispro (HUMALOG) 100 UNIT/ML injection Inject 18 Units into the skin 3 (three) times daily before meals.    [provider]  labetalol (NORMODYNE) 300 MG tablet Take 300 mg by mouth 3 (three) times daily.    [provider]  levothyroxine (UNITHROID) 112 MCG tablet  Take 112 mcg by mouth daily before breakfast.    [provider]  metFORMIN (GLUCOPHAGE) 500 MG tablet Take 500 mg by mouth daily with breakfast.    [provider]  niacin (NIASPAN) 1000 MG CR tablet Take 1,000 mg by mouth at bedtime.    [provider]  nystatin-triamcinolone Lilyan Gilford II) cream Apply to affected area daily 11/27/17   Albesa Seen, PA-C    Family History No family history on file.  Social History Social History   Tobacco Use   Smoking status: Never Smoker   Smokeless tobacco: Never Used  Substance Use Topics   Alcohol use: Never     Frequency: Never   Drug use: Never     Allergies   Shellfish allergy and Penicillins   Review of Systems Review of Systems  Constitutional: Negative for chills and fever.  Eyes: Negative for visual disturbance.  Respiratory: Negative for cough and shortness of breath.   Cardiovascular: Negative for chest pain.  Gastrointestinal: Negative for abdominal pain.  Genitourinary: Negative for dysuria.  Neurological: Positive for weakness (baseline left sided weakness), numbness and headaches. Negative for syncope.  All other systems reviewed and are negative.    Physical Exam Updated Vital Signs BP (!) 153/111 (BP Location: Right Arm)    Pulse (!) 138    Temp 98 F (36.7 C)    Resp 20    SpO2 99%   Physical Exam Vitals signs and nursing note reviewed.  Constitutional:      General: [name] not in acute distress.    Appearance: Normal appearance. [name] well-developed. [name] not ill-appearing.  HENT:     Head: Normocephalic and atraumatic.  Eyes:     General: No scleral icterus.       Right eye: No discharge.        Left eye: No discharge.     Conjunctiva/sclera: Conjunctivae normal.     Pupils: Pupils are equal, round, and reactive to light.     Comments: Ptosis of right eye  Neck:     Musculoskeletal: Normal range of motion.  Cardiovascular:     Rate and Rhythm: Normal rate.  Pulmonary:     Effort: Pulmonary effort is normal. No respiratory distress.  Abdominal:     General: There is no distension.  Skin:    General: Skin is warm and dry.  Neurological:     Mental Status: [name] alert and oriented to person, place, and time.     Comments: Mental Status:  Alert, oriented, thought content appropriate, able to give a coherent history. Speech fluent without evidence of aphasia. Able to follow 2 step commands without difficulty.  Cranial Nerves:  II:  Peripheral visual fields grossly normal, pupils equal, round, reactive to light III,IV, VI: ptosis present on right  side, EOM normal on left. Right eye has limited ROM at baseline V,VII: smile symmetric, facial light touch sensation equal VIII: hearing grossly normal to voice  X: uvula elevates symmetrically  XI: bilateral shoulder shrug symmetric and strong XII: midline tongue extension without fassiculations Motor:  Decreased muscle tone of the left upper and lower extremity. 5/5 in upper and lower extremities on the right side. 4/5 strength in upper and lower extremities on the left. There is left wrist drop and foot drop which is baseline. Sensory: Subjective decreased sensation of bilateral hands and left forearm. Decreased sensation over the right lower shin  Cerebellar: normal finger-to-nose with bilateral upper extremities Gait: not tested CV:  distal pulses palpable throughout    Psychiatric:        Behavior: Behavior normal.      ED Treatments / Results  Labs (all labs ordered are listed, but only abnormal results are displayed) Labs Reviewed  CBC WITH DIFFERENTIAL/PLATELET - Abnormal; Notable for the following components:      Result Value   Hemoglobin 15.2 (*)    HCT 35.1 (*)    MCH 34.7 (*)    MCHC 43.3 (*)    All other components within normal limits  BASIC METABOLIC PANEL - Abnormal; Notable for the following components:   Sodium 134 (*)    Chloride 97 (*)    CO2 20 (*)    Glucose, Bld 440 (*)    Anion gap 17 (*)    All other components within normal limits  URINALYSIS, ROUTINE W REFLEX MICROSCOPIC - Abnormal; Notable for the following components:   Color, Urine STRAW (*)    Glucose, UA >=500 (*)    Hgb urine dipstick SMALL (*)    Protein, ur 30 (*)    Bacteria, UA RARE (*)    All other components within normal limits  CBG MONITORING, ED - Abnormal; Notable for the following components:   Glucose-Capillary 213 (*)    All other components within normal limits  I-STAT BETA HCG BLOOD, ED ([facility], [facility], [facility] ONLY)    EKG EKG Interpretation  Date/Time:  Monday July 23 2018  13:17:23 EDT Ventricular Rate:  155 PR Interval:    QRS Duration: 83 QT Interval:  287 QTC Calculation: 461 R Axis:   57 Text Interpretation:  Sinus tachycardia Nonspecific repol abnormality, lateral leads Baseline wander in lead(s) V2 Since last tracing rate faster Confirmed by Dorie Rank [phone]) on 07/23/2018 1:54:44 PM   Radiology Ct Head Wo Contrast  Result Date: 07/23/2018 CLINICAL DATA:  28 year old female with a history of prior brain tumor, now with neurologic symptoms. EXAM: CT HEAD WITHOUT CONTRAST TECHNIQUE: Contiguous axial images were obtained from the base of the skull through the vertex without intravenous contrast. COMPARISON:  None. FINDINGS: Brain: Hyperdense focus associated with the right cerebral peduncle, with rounded configuration and questionable cystic change. Greatest diameter measures 14 mm on the axial, 12 mm on the coronal, and 13 mm on the parasagittal images. The third ventricle measures 14 mm in width, with no comparison available. Questionable prominence of the anterior horns of the lateral ventricles with no comparison. Gray-white differentiation maintained.  No midline shift. Vascular: Unremarkable Skull: Surgical changes of right temporal craniotomy. Sinuses/Orbits: No acute finding. Other: None IMPRESSION: Rounded hyperdense focus associated with the right cerebral peduncle. Differential includes intra-axial mass (potentially with cystic change), as well evolving focus of intracerebral hemorrhage. Given the appearance of possible early hydrocephalus and patient's history of treatment for prior tumor, correlation with any available prior imaging is recommended to evaluate for interval change. If no imaging is available for comparison, referral for neurologic evaluation and possible contrast-enhanced MRI recommended. Surgical changes of prior temporal craniotomy. These results were discussed by telephone at the time of interpretation on 07/23/2018 at 3:35 pm with Ahamed Hofland. Electronically Signed   By: Corrie Mckusick D.O.   On: 07/23/2018 15:39   Dg Chest Port 1 View  Result Date: 07/23/2018 CLINICAL DATA:  Generalized body aches worsening over the last week. EXAM: PORTABLE CHEST 1 VIEW COMPARISON:  None. FINDINGS: 1417 hours. The lungs are clear without focal pneumonia, edema, pneumothorax or pleural effusion. The cardiopericardial silhouette is within  normal limits for size. The visualized bony structures of the thorax are intact. Telemetry leads overlie the chest. IMPRESSION: No active disease. Electronically Signed   By: Misty Stanley M.D.   On: 07/23/2018 15:01    Procedures Procedures (including critical care time)  Medications Ordered in ED Medications  sodium chloride 0.9 % bolus 1,000 mL (0 mLs Intravenous Stopped 07/23/18 1803)  gabapentin (NEURONTIN) capsule 300 mg (300 mg Oral Given 07/23/18 1442)     Initial Impression / Assessment and Plan / ED Course  I have reviewed the triage vital signs and the nursing notes.  Pertinent labs & imaging results that were available during my care of the patient were reviewed by me and considered in my medical decision making (see chart for details).  28 year old female presents with pain and paresthesias in the hands and feet. [name] significantly tachycardic between 120-140 here. She states her baseline is ~120. [name] also hypertensive but this is not abnormal for her either. Temp is normal and there is no hypoxia or tachypnea. She has focal neuro deficits on her exam but she states these have been present since her brain tumor. The only new symptoms are numbness and pain which sounds more like neuropathy. Will order labs and CT head. Will give fluids for probably hyperglycemia and her tachycardia.  CBC is remarkable for slightly elevated hgb. BMP had to be sent to [facility] because it was so lipemic. It shows glucose of 440 and anion gap is 17. UA does not have ketones. CT head shows a mass in the right cerebral  peduncle. The patient says she knows about this and this is what was being watched in [place]. I recommended obtaining MRI today but she refused. I discussed with her that the concern is that we are not able to directly compare images from her prior MRI scans in [place] (although we have the report) and therefore I cannot definitively say that the mass in not growing or that it is not hemorrhage. The patient understands and strongly feels that this is the brain mass that she's had and is refusing MRI today. We also discussed that her labs are showing evidence of possible DKA with hyperglycemia and elevated anion gap. She states that she overall feels well and wants to go home. She had a very similar presentation a couple months ago and was discharged at that time as well. She was strongly encouraged to establish care with a PCP. She was given refills of her insulin and rx for gabapentin. Strict return precautions were given.  Final Clinical Impressions(s) / ED Diagnoses   Final diagnoses:  Hyperglycemia  Paresthesias    ED Discharge Orders    None       Iris Pert 07/23/18 Evorn Gong, MD 07/24/18 [phone]

## 2018-07-28 ENCOUNTER — Emergency Department (HOSPITAL_COMMUNITY)
Admission: EM | Admit: 2018-07-28 | Discharge: 2018-07-29 | Disposition: A | Discharge status: Home / Self Care | Payer: Self-pay | Attending: Emergency Medicine | Admitting: Emergency Medicine

## 2018-07-28 ENCOUNTER — Encounter (HOSPITAL_COMMUNITY): Payer: Self-pay

## 2018-07-28 ENCOUNTER — Emergency Department (HOSPITAL_COMMUNITY): Payer: Self-pay

## 2018-07-28 ENCOUNTER — Other Ambulatory Visit: Payer: Self-pay

## 2018-07-28 DIAGNOSIS — Z794 Long term (current) use of insulin: Secondary | ICD-10-CM | POA: Insufficient documentation

## 2018-07-28 DIAGNOSIS — E119 Type 2 diabetes mellitus without complications: Secondary | ICD-10-CM | POA: Insufficient documentation

## 2018-07-28 DIAGNOSIS — Z79899 Other long term (current) drug therapy: Secondary | ICD-10-CM | POA: Insufficient documentation

## 2018-07-28 DIAGNOSIS — I10 Essential (primary) hypertension: Secondary | ICD-10-CM | POA: Insufficient documentation

## 2018-07-28 DIAGNOSIS — R202 Paresthesia of skin: Secondary | ICD-10-CM | POA: Insufficient documentation

## 2018-07-28 LAB — CBG MONITORING, ED: Glucose-Capillary: 327 mg/dL — ABNORMAL HIGH (ref 70–99)

## 2018-07-28 MED ORDER — SODIUM CHLORIDE 0.9 % IV BOLUS
1000.0000 mL | Freq: Once | INTRAVENOUS | Status: AC
  Administered 2018-07-29: 01:00:00 1000 mL via INTRAVENOUS

## 2018-07-28 MED ORDER — HYDROMORPHONE HCL 1 MG/ML IJ SOLN
0.5000 mg | Freq: Once | INTRAMUSCULAR | Status: AC
  Administered 2018-07-29: 0.5 mg via INTRAVENOUS
  Filled 2018-07-28: qty 1

## 2018-07-28 NOTE — ED Triage Notes (Signed)
States numbness in hand bilaterally seen here on Monday for same complaints and given gabapentin.

## 2018-07-28 NOTE — ED Provider Notes (Signed)
[facility] DEPT Provider Note   CSN: [id number] Arrival date & time: 07/28/18  2038    History   Chief Complaint Chief Complaint  Patient presents with   Numbness    see here Monday for same and given gabapentin    HPI Jennifer [name] is a 28 y.o. female with a h/o of DM Type 2, PCOS, chronic pancreatitis, HTN, and glioma of midbrain, thalassemia minor, and familial hypertriglyceridemia who presents to the emergency department with a chief complaint of numbness.  The patient endorses "burning" paresthesias and numbness in the bilateral hands.  She was seen in the ER for the same on 07/23/18.  During ER visit, she was significantly tachycardic in the 120-140s, but informed PA that her baseline was around 120.  She was found to have possible DKA and was offered admission, but the patient declined.  She was discharged with a refill of her home insulin and prescription for gabapentin.  Previously, she was taking ibuprofen for her pain.  She reports that since discharge that paresthesias and numbness in her bilateral hands is worsening.  She reports the pain and numbness is now in her bilateral forearms.  [name] also having some burning pain in her bilateral feet.   She reports that she moved to the area in August 2019 from [place].  She was previously established with neurology and endocrinology in [place], but does not have the medical team in [place].   She denies recent fevers, chills.  She does report that she has been having urinary frequency and is concerned she might have a UTI.  No vaginal pain or discharge.  No nausea, vomiting, diarrhea, dizziness, lightheadedness, cough, fever, chills, or shortness of breath.  She reports that she was having some sharp generalized chest pain several days ago, which has since resolved.  It was nonexertional and she had no shortness of breath.  She denies neck or back pain.  She reports that she has been having  headaches, but states that this is chronic.  No change from previous.  She has been taking the gabapentin at home without improvement in her pain.  She reports pain is 10 out of 10 and has become intolerable.  No known aggravating or alleviating factors.  She has a history of left-sided weakness at baseline.  Per chart review:   12/17/95 diagnosed with hydrocephalus and tegmental glioma.  12/22/1995: external ventricular drain placed by Dr. Evelina Bucy at [facility] 12/27/1995: resection by Dr. Ouida Sills at [facility], sustained right midbrain infarct 1997: Treated with TPCV on CCG [id number] regimen B  She has an acquired right 3rd nerve palsy and spastic left hemiparesis secondary to glioma.  However, patient reports that she has a history of an astrocytoma?    The history is provided by the patient and medical records. No language interpreter was used.    Past Medical History:  Diagnosis Date   Brain tumor ([place]) 03/29/1995   Cholesterosis    Diabetes mellitus without complication ([place])    Fatty liver    Hypertension    Pancreatitis    Polycystic ovary syndrome     There are no active problems to display for this patient.   Past Surgical History:  Procedure Laterality Date   ABDOMINAL SURGERY     pt states during miscarriage got her intestine   BRAIN SURGERY     EYE MUSCLE SURGERY Right 03/28/2014   VENTRICULOSTOMY  03/28/1997     OB History  No obstetric history on file.      Home Medications    Prior to Admission medications   Medication Sig Start Date End Date Taking? Authorizing Provider  acetaminophen (TYLENOL) 500 MG tablet Take 500 mg by mouth every 6 (six) hours as needed for fever.   Yes [provider]  ALPHA LIPOIC ACID-BIOTIN PO Take 1 tablet by mouth daily.   Yes [provider]  CRANBERRY FRUIT PO Take 1 capsule by mouth 2 (two) times a day.   Yes [provider]  gabapentin (NEURONTIN) 300 MG capsule Take 1 capsule (300 mg  total) by mouth 3 (three) times daily. 07/23/18  Yes Recardo Evangelist, PA-C  ibuprofen (ADVIL,MOTRIN) 200 MG tablet Take 200-400 mg by mouth every 6 (six) hours as needed for moderate pain.    Yes [provider]  Insulin Glargine (TOUJEO SOLOSTAR) 300 UNIT/ML SOPN Inject 56 Units into the skin at bedtime.    Yes [provider]  insulin lispro (HUMALOG) 100 UNIT/ML injection Inject 0.1-0.15 mLs (10-15 Units total) into the skin 3 (three) times daily before meals. 07/23/18  Yes Recardo Evangelist, PA-C  labetalol (NORMODYNE) 300 MG tablet Take 300 mg by mouth 3 (three) times daily.   Yes [provider]  levOCARNitine L-Tartrate (L-CARNITINE) 500 MG CAPS Take 1,000 mg by mouth daily.   Yes [provider]  levothyroxine (SYNTHROID) 125 MCG tablet Take 125 mcg by mouth daily before breakfast.   Yes [provider]  meloxicam (MOBIC) 15 MG tablet Take 15 mg by mouth daily.   Yes [provider]  metFORMIN (GLUCOPHAGE-XR) 500 MG 24 hr tablet Take 500 mg by mouth 2 (two) times a day.   Yes [provider]  Multiple Vitamin (MULTIVITAMIN IRON-FREE PO) Take 1 tablet by mouth daily.   Yes [provider]  niacin (NIASPAN) 1000 MG CR tablet Take 1,000 mg by mouth at bedtime.   Yes [provider]  OVER THE COUNTER MEDICATION Take 2 mLs by mouth daily. Vitamin B complex Liquid   Yes [provider]    Family History History reviewed. No pertinent family history.  Social History Social History   Tobacco Use   Smoking status: Never Smoker   Smokeless tobacco: Never Used  Substance Use Topics   Alcohol use: Never    Frequency: Never   Drug use: Never     Allergies   Clarithromycin; Shellfish allergy; Penicillins; and Prednisone   Review of Systems Review of Systems  Constitutional: Negative for activity change, chills, diaphoresis and fever.  HENT: Negative for congestion, ear discharge, ear pain,  facial swelling, sinus pressure, sneezing, sore throat, tinnitus and voice change.   Respiratory: Negative for apnea, cough, shortness of breath and wheezing.   Cardiovascular: Negative for chest pain, palpitations and leg swelling.  Gastrointestinal: Negative for abdominal pain, diarrhea, nausea and vomiting.  Genitourinary: Positive for dysuria. Negative for decreased urine volume, urgency, vaginal bleeding, vaginal discharge and vaginal pain.  Musculoskeletal: Negative for back pain.  Skin: Negative for rash.  Allergic/Immunologic: Negative for immunocompromised state.  Neurological: Positive for weakness (chronic, left-sided), numbness and headaches (chronic). Negative for dizziness, seizures and syncope.       Paresthesias  Psychiatric/Behavioral: Negative for confusion.     Physical Exam Updated Vital Signs BP (!) 147/101    Pulse (!) 105    Temp 98.8 F (37.1 C) (Oral)    Resp 19    Ht 4' 9"  (1.448 m)  Wt 44.9 kg    LMP 07/05/2018    SpO2 91%    BMI 21.42 kg/m   Physical Exam Vitals signs and nursing note reviewed.  Constitutional:      General: [name] not in acute distress.    Appearance: [name] ill-appearing (chronically).  HENT:     Head: Normocephalic.     Right Ear: External ear normal.     Left Ear: External ear normal.  Eyes:     General: No scleral icterus.    Extraocular Movements: Extraocular movements intact.     Conjunctiva/sclera: Conjunctivae normal.     Pupils: Pupils are equal, round, and reactive to light.     Comments: Ptosis on the right  Neck:     Musculoskeletal: Normal range of motion and neck supple. No neck rigidity or muscular tenderness.  Cardiovascular:     Rate and Rhythm: Regular rhythm. Tachycardia present.     Heart sounds: No murmur. No friction rub. No gallop.   Pulmonary:     Effort: Pulmonary effort is normal. No respiratory distress.     Breath sounds: No stridor. No wheezing, rhonchi or rales.  Chest:     Chest wall: No  tenderness.  Abdominal:     General: There is no distension.     Palpations: Abdomen is soft. There is no mass.     Tenderness: There is no abdominal tenderness. There is no right CVA tenderness, left CVA tenderness, guarding or rebound.     Hernia: No hernia is present.  Musculoskeletal:     Comments: No tenderness to the cervical, thoracic, lumbar spinous processes or bilateral paraspinal muscles.  Lymphadenopathy:     Cervical: No cervical adenopathy.  Skin:    General: Skin is warm.     Findings: No rash.  Neurological:     Mental Status: [name] alert.     Comments: Mental Status: Alert, oriented, thought content appropriate, able to give a coherent history. Speech fluent without evidence of aphasia. Cranial Nerves:  II:  Peripheral visual fields grossly normal, pupils equal, round, reactive to light III,IV, VI: ptosis present on right side, EOM normal on left. Limited EOM of right eye (patient's baseline) V,VII: smile symmetric, facial light touch sensation equal VIII: hearing grossly normal to voice  X: uvula elevates symmetrically  XI: bilateral shoulder shrug symmetric and strong XII: midline tongue extension without fassiculations Motor:  Decreased muscle tone of the left upper and lower extremity. 5/5 in upper and lower extremities on the right side. 4/5 strength in upper and lower extremities on the left. There is left wrist drop and foot drop which is baseline. Sensory: Decreased sensation to sharp and light touch to the bilateral hands and forearms and lower right shin.  Cerebellar: normal finger-to-nose with bilateral upper extremities Gait: Limping gait favoring the right side CV: distal pulses palpable throughout   Psychiatric:        Behavior: Behavior normal.      ED Treatments / Results  Labs (all labs ordered are listed, but only abnormal results are displayed) Labs Reviewed  CBC - Abnormal; Notable for the following components:      Result Value    Hemoglobin 10.7 (*)    HCT 30.9 (*)    MCV 79.2 (*)    RDW 15.8 (*)    All other components within normal limits  COMPREHENSIVE METABOLIC PANEL - Abnormal; Notable for the following components:   Sodium 134 (*)    CO2 20 (*)  Glucose, Bld 339 (*)    BUN 22 (*)    ALT 46 (*)    All other components within normal limits  URINALYSIS, ROUTINE W REFLEX MICROSCOPIC - Abnormal; Notable for the following components:   Color, Urine STRAW (*)    Glucose, UA >=500 (*)    Hgb urine dipstick SMALL (*)    Protein, ur 100 (*)    All other components within normal limits  CBG MONITORING, ED - Abnormal; Notable for the following components:   Glucose-Capillary 327 (*)    All other components within normal limits  URINE CULTURE  LIPASE, BLOOD  I-STAT BETA HCG BLOOD, ED ([facility], [facility], [facility] ONLY)    EKG EKG Interpretation  Date/Time:  Sunday Jul 29 2018 02:46:14 EDT Ventricular Rate:  115 PR Interval:    QRS Duration: 88 QT Interval:  352 QTC Calculation: 487 R Axis:   16 Text Interpretation:  Sinus tachycardia Borderline prolonged QT interval No significant change was found Confirmed by Shanon Rosser [id number]) on 07/29/2018 2:51:15 AM   Radiology Ct Head Wo Contrast  Result Date: 07/29/2018 CLINICAL DATA:  Initial evaluation for subacute neuro deficits. EXAM: CT HEAD WITHOUT CONTRAST TECHNIQUE: Contiguous axial images were obtained from the base of the skull through the vertex without intravenous contrast. COMPARISON:  Recent CT from 07/23/2018 FINDINGS: Brain: Again seen is a round hyperdense focus measuring 11 x 15 x 15 mm in the region of the right cerebral peduncle, unchanged (series 2, image 11) question small focus of internal cystic change. No significant regional mass effect or edema. Adjacent third ventricle mildly dilated measuring up to 9 mm at the level of the anterior recess. No significant lateral ventricular dilatation. Fourth ventricle of normal caliber. Overall, appearance is unchanged  from previous. Prior ventriculostomy tracks noted within the frontal lobes bilaterally. No other acute intracranial hemorrhage. No acute large vessel territory infarct. No other mass lesion. No midline shift or mass effect. No extra-axial fluid collection. Vascular: No unexpected hyperdense vessel. Skull: Scalp soft tissues demonstrate no acute finding. Prior right temporal craniotomy. Sinuses/Orbits: Globes and orbital soft tissues within normal limits. Right maxillary sinus retention cyst partially visualized. Paranasal sinuses are otherwise clear. No mastoid effusion. Other: None. IMPRESSION: 1. Stable head CT as compared to 07/23/2018. Persistent round hyperdense lesion positioned in the region of the right cerebral peduncle, favored to reflect a small intra-axial mass given stability relative to prior exam. No significant regional edema or mass effect. Mild dilatation of the adjacent third ventricle without overt hydrocephalus. Again, correlation with history and prior imaging if available recommended, as presumably this is a known and chronic finding given the additional postoperative changes in the brain. 2. No other acute intracranial abnormality. 3. Sequelae of prior right temporal craniotomy and bifrontal ventriculostomies. Electronically Signed   By: Jeannine Boga M.D.   On: 07/29/2018 00:54    Procedures Procedures (including critical care time)  Medications Ordered in ED Medications  ondansetron (ZOFRAN) injection 4 mg (4 mg Intravenous Not Given 07/29/18 0250)  HYDROmorphone (DILAUDID) injection 0.5 mg (0.5 mg Intravenous Given 07/29/18 0043)  sodium chloride 0.9 % bolus 1,000 mL (0 mLs Intravenous Stopped 07/29/18 0229)  labetalol (NORMODYNE) tablet 300 mg (300 mg Oral Given 07/29/18 0256)  HYDROmorphone (DILAUDID) injection 0.5 mg (0.5 mg Intravenous Given 07/29/18 0254)  diphenhydrAMINE (BENADRYL) injection 12.5 mg (12.5 mg Intravenous Given 07/29/18 0411)  HYDROmorphone (DILAUDID)  injection 0.5 mg (0.5 mg Intravenous Given 07/29/18 0533)  sodium chloride 0.9 % bolus 1,000  mL (0 mLs Intravenous Stopped 07/29/18 1157)  diphenhydrAMINE (BENADRYL) injection 12.5 mg (12.5 mg Intravenous Given 07/29/18 0533)  diphenhydrAMINE (BENADRYL) injection 12.5 mg (12.5 mg Intravenous Given 07/29/18 2620)     Initial Impression / Assessment and Plan / ED Course  I have reviewed the triage vital signs and the nursing notes.  Pertinent labs & imaging results that were available during my care of the patient were reviewed by me and considered in my medical decision making (see chart for details).        28 year old female with a h/o of DM Type 2, PCOS, chronic pancreatitis, HTN, and glioma of midbrain, thalassemia minor, and familial hypertriglyceridemia presenting from home with worsening tingling, burning paresthesias of the bilateral hands that are now extending into the bilateral forearms.  She also reports that she is starting to have some mild paresthesias to the bilateral feet.  She was seen in the ER for the same on 427 and was prescribed gabapentin, which she has been taking.  However, pain significantly worsened tonight became intolerable.  On arrival, patient was found to be tachycardic in the 130s.  She was hypertensive at 160s/120s.  She had no hypoxia, tachypnea and was afebrile.  Differential diagnosis includes diabetic peripheral neuropathy secondary to poor glycemic control, cervical cord lesion, DKA, or CVA.   Repeat CT comparison stable when compared to 427.  There is a persistent round hyperdense lesion in the region of the right cerebral peduncle that is favored to reflect a small intracranial mass.  There is no significant edema or mass-effect.  There is mild dilatation of the adjacent third ventricle without overt hydrocephalus.  UA with mild hemoglobinuria and severe glucosuria.  Urine culture sent as the patient was having symptoms.  UA does not appear overly infectious.   We will plan to wait for culture and treat with ABX.   Glucose is 339 with bicarb of 20 and normal anion gap.  Significantly improved from labs on 427.  Hemoglobin is 10.7.  The patient is having no bleeding.  This is a large change from her hemoglobin of 15 on 427.  However, patient's blood must be ultracentrifuged given her history of hypercholesteremia which could also account for some of the variable change.  I suspect previous blood work was also somewhat hemoconcentrated.  I discussed this finding with Dr. Florina Ou, attending physician.  Patient did inform me that her baseline heart rate is always in the 120s.  However, she was given 2 L of IV fluids and heart rate improved into the low 100s, which also makes me suspect that previous blood work was significantly hemoconcentrated.  After reviewing care everywhere, it does appear that her heart rate runs in the low 100s.   EKG with sinus tachycardia and borderline prolonged QT.  Will avoid QT prolonging medications.   Patient's pain was significantly controlled with Dilaudid.  She required Benadryl for itching.  Given the patient's worsening symptoms.  It is unlikely that symptoms have progressively worsened in such a short time from diabetic neuropathy.  I consulted neurology and spoke with Dr. Leonel Ramsay who recommends MR cervical spine with and without.  This has been ordered patient is agreeable to testing.  Patient care transferred to [facility] at the end of my shift pending MR cervical spine. Patient presentation, ED course, and plan of care discussed with review of all pertinent labs and imaging. Please see his/her note for further details regarding further ED course and disposition.  Final Clinical Impressions(s) /  ED Diagnoses   Final diagnoses:  None    ED Discharge Orders    None       Joanne Gavel, PA-C 07/29/18 0713    Shanon Rosser, MD 08/01/18 2250

## 2018-07-29 ENCOUNTER — Emergency Department (HOSPITAL_COMMUNITY): Payer: Self-pay

## 2018-07-29 LAB — URINALYSIS, ROUTINE W REFLEX MICROSCOPIC
Bacteria, UA: NONE SEEN
Bilirubin Urine: NEGATIVE
Glucose, UA: >=500 mg/dL — AB
Ketones, ur: NEGATIVE mg/dL
Leukocytes,Ua: NEGATIVE
Nitrite: NEGATIVE
Protein, ur: 100 mg/dL — AB
Specific Gravity, Urine: 1.023 (ref 1.005–1.030)
pH: 6 (ref 5.0–8.0)

## 2018-07-29 LAB — COMPREHENSIVE METABOLIC PANEL
ALT: 46 U/L — ABNORMAL HIGH (ref 0–44)
AST: 37 U/L (ref 15–41)
Albumin: 4 g/dL (ref 3.5–5.0)
Alkaline Phosphatase: 68 U/L (ref 38–126)
Anion gap: 15 (ref 5–15)
BUN: 22 mg/dL — ABNORMAL HIGH (ref 6–20)
CO2: 20 mmol/L — ABNORMAL LOW (ref 22–32)
Calcium: 9.4 mg/dL (ref 8.9–10.3)
Chloride: 99 mmol/L (ref 98–111)
Creatinine, Ser: 0.64 mg/dL (ref 0.44–1.00)
GFR calc Af Amer: >60 mL/min (ref >60)
GFR calc non Af Amer: >60 mL/min (ref >60)
Glucose, Bld: 339 mg/dL — ABNORMAL HIGH (ref 70–99)
Potassium: 4.5 mmol/L (ref 3.5–5.1)
Sodium: 134 mmol/L — ABNORMAL LOW (ref 135–145)
Total Bilirubin: 0.5 mg/dL (ref 0.3–1.2)
Total Protein: 7.1 g/dL (ref 6.5–8.1)

## 2018-07-29 LAB — LIPASE, BLOOD: Lipase: 32 U/L (ref 11–51)

## 2018-07-29 LAB — I-STAT BETA HCG BLOOD, ED (MC, WL, AP ONLY): I-stat hCG, quantitative: <5 m[IU]/mL (ref <5)

## 2018-07-29 LAB — CBC
HCT: 30.9 % — ABNORMAL LOW (ref 36.0–46.0)
Hemoglobin: 10.7 g/dL — ABNORMAL LOW (ref 12.0–15.0)
MCH: 27.4 pg (ref 26.0–34.0)
MCHC: 34.6 g/dL (ref 30.0–36.0)
MCV: 79.2 fL — ABNORMAL LOW (ref 80.0–100.0)
Platelets: 216 10*3/uL (ref 150–400)
RBC: 3.9 MIL/uL (ref 3.87–5.11)
RDW: 15.8 % — ABNORMAL HIGH (ref 11.5–15.5)
WBC: 9.1 10*3/uL (ref 4.0–10.5)
nRBC: 0 % (ref 0.0–0.2)

## 2018-07-29 MED ORDER — DIPHENHYDRAMINE HCL 50 MG/ML IJ SOLN
12.5000 mg | Freq: Once | INTRAMUSCULAR | Status: AC
  Administered 2018-07-29: 12.5 mg via INTRAVENOUS
  Filled 2018-07-29: qty 1

## 2018-07-29 MED ORDER — SODIUM CHLORIDE 0.9 % IV BOLUS
1000.0000 mL | Freq: Once | INTRAVENOUS | Status: AC
  Administered 2018-07-29: 1000 mL via INTRAVENOUS

## 2018-07-29 MED ORDER — GADOBUTROL 1 MMOL/ML IV SOLN
5.0000 mL | Freq: Once | INTRAVENOUS | Status: AC | PRN
  Administered 2018-07-29: 5 mL via INTRAVENOUS

## 2018-07-29 MED ORDER — HYDROMORPHONE HCL 1 MG/ML IJ SOLN
0.5000 mg | Freq: Once | INTRAMUSCULAR | Status: AC
  Administered 2018-07-29: 0.5 mg via INTRAVENOUS
  Filled 2018-07-29: qty 1

## 2018-07-29 MED ORDER — ONDANSETRON HCL 4 MG/2ML IJ SOLN: 4.0000 mg | Freq: Once | INTRAMUSCULAR | Status: DC

## 2018-07-29 MED ORDER — LABETALOL HCL 300 MG PO TABS
300.0000 mg | ORAL_TABLET | Freq: Once | ORAL | Status: AC
  Administered 2018-07-29: 300 mg via ORAL
  Filled 2018-07-29: qty 1

## 2018-07-29 NOTE — ED Notes (Signed)
Urine culture sent to the lab. 

## 2018-07-29 NOTE — Discharge Instructions (Addendum)
Please follow up with neurologist for further evaluation of your symptoms.  Your MRI today is normal.

## 2018-07-29 NOTE — ED Notes (Signed)
IV removed by NT.

## 2018-07-29 NOTE — ED Notes (Signed)
Patient transported to CT 

## 2018-07-29 NOTE — ED Notes (Signed)
Patient reports she has a ride home.

## 2018-07-29 NOTE — ED Provider Notes (Signed)
Pending cervical MRI.  If neg, referred to neurology.    12:46 PM Patient complaining of worsening tingling burning sensation in both hands and forearms as well as her feet.  She was prescribed gabapentin previously.  Neurology was consulted for her symptom and recommended cervical MRI.  MRI was obtained without any acute finding.  We will give patient outpatient referral to neurology for further care.  BP (!) 148/102   Pulse (!) 108   Temp 98.8 F (37.1 C) (Oral)   Resp 19   Ht 4' 9"  (1.448 m)   Wt 44.9 kg   LMP 07/05/2018   SpO2 96%   BMI 21.42 kg/m   Results for orders placed or performed during the hospital encounter of 07/28/18  CBC  Result Value Ref Range   WBC 9.1 4.0 - 10.5 K/uL   RBC 3.90 3.87 - 5.11 MIL/uL   Hemoglobin 10.7 (L) 12.0 - 15.0 g/dL   HCT 30.9 (L) 36.0 - 46.0 %   MCV 79.2 (L) 80.0 - 100.0 fL   MCH 27.4 26.0 - 34.0 pg   MCHC 34.6 30.0 - 36.0 g/dL   RDW 15.8 (H) 11.5 - 15.5 %   Platelets 216 150 - 400 K/uL   nRBC 0.0 0.0 - 0.2 %  Comprehensive metabolic panel  Result Value Ref Range   Sodium 134 (L) 135 - 145 mmol/L   Potassium 4.5 3.5 - 5.1 mmol/L   Chloride 99 98 - 111 mmol/L   CO2 20 (L) 22 - 32 mmol/L   Glucose, Bld 339 (H) 70 - 99 mg/dL   BUN 22 (H) 6 - 20 mg/dL   Creatinine, Ser 0.64 0.44 - 1.00 mg/dL   Calcium 9.4 8.9 - 10.3 mg/dL   Total Protein 7.1 6.5 - 8.1 g/dL   Albumin 4.0 3.5 - 5.0 g/dL   AST 37 15 - 41 U/L   ALT 46 (H) 0 - 44 U/L   Alkaline Phosphatase 68 38 - 126 U/L   Total Bilirubin 0.5 0.3 - 1.2 mg/dL   GFR calc non Af Amer >60 >60 mL/min   GFR calc Af Amer >60 >60 mL/min   Anion gap 15 5 - 15  Urinalysis, Routine w reflex microscopic  Result Value Ref Range   Color, Urine STRAW (A) YELLOW   APPearance CLEAR CLEAR   Specific Gravity, Urine 1.023 1.005 - 1.030   pH 6.0 5.0 - 8.0   Glucose, UA >=500 (A) NEGATIVE mg/dL   Hgb urine dipstick SMALL (A) NEGATIVE   Bilirubin Urine NEGATIVE NEGATIVE   Ketones, ur NEGATIVE  NEGATIVE mg/dL   Protein, ur 100 (A) NEGATIVE mg/dL   Nitrite NEGATIVE NEGATIVE   Leukocytes,Ua NEGATIVE NEGATIVE   RBC / HPF 0-5 0 - 5 RBC/hpf   WBC, UA 0-5 0 - 5 WBC/hpf   Bacteria, UA NONE SEEN NONE SEEN   Squamous Epithelial / LPF 0-5 0 - 5   Mucus PRESENT    Hyaline Casts, UA PRESENT   Lipase, blood  Result Value Ref Range   Lipase 32 11 - 51 U/L  CBG monitoring, ED  Result Value Ref Range   Glucose-Capillary 327 (H) 70 - 99 mg/dL  I-Stat Beta hCG blood, ED ([facility], [facility], [facility] only)  Result Value Ref Range   I-stat hCG, quantitative <5.0 <5 mIU/mL   Comment 3           Ct Head Wo Contrast  Result Date: 07/29/2018 CLINICAL DATA:  Initial evaluation for subacute neuro  deficits. EXAM: CT HEAD WITHOUT CONTRAST TECHNIQUE: Contiguous axial images were obtained from the base of the skull through the vertex without intravenous contrast. COMPARISON:  Recent CT from 07/23/2018 FINDINGS: Brain: Again seen is a round hyperdense focus measuring 11 x 15 x 15 mm in the region of the right cerebral peduncle, unchanged (series 2, image 11) question small focus of internal cystic change. No significant regional mass effect or edema. Adjacent third ventricle mildly dilated measuring up to 9 mm at the level of the anterior recess. No significant lateral ventricular dilatation. Fourth ventricle of normal caliber. Overall, appearance is unchanged from previous. Prior ventriculostomy tracks noted within the frontal lobes bilaterally. No other acute intracranial hemorrhage. No acute large vessel territory infarct. No other mass lesion. No midline shift or mass effect. No extra-axial fluid collection. Vascular: No unexpected hyperdense vessel. Skull: Scalp soft tissues demonstrate no acute finding. Prior right temporal craniotomy. Sinuses/Orbits: Globes and orbital soft tissues within normal limits. Right maxillary sinus retention cyst partially visualized. Paranasal sinuses are otherwise clear. No mastoid effusion.  Other: None. IMPRESSION: 1. Stable head CT as compared to 07/23/2018. Persistent round hyperdense lesion positioned in the region of the right cerebral peduncle, favored to reflect a small intra-axial mass given stability relative to prior exam. No significant regional edema or mass effect. Mild dilatation of the adjacent third ventricle without overt hydrocephalus. Again, correlation with history and prior imaging if available recommended, as presumably this is a known and chronic finding given the additional postoperative changes in the brain. 2. No other acute intracranial abnormality. 3. Sequelae of prior right temporal craniotomy and bifrontal ventriculostomies. Electronically Signed   By: Jeannine Boga M.D.   On: 07/29/2018 00:54   Ct Head Wo Contrast  Result Date: 07/23/2018 CLINICAL DATA:  28 year old female with a history of prior brain tumor, now with neurologic symptoms. EXAM: CT HEAD WITHOUT CONTRAST TECHNIQUE: Contiguous axial images were obtained from the base of the skull through the vertex without intravenous contrast. COMPARISON:  None. FINDINGS: Brain: Hyperdense focus associated with the right cerebral peduncle, with rounded configuration and questionable cystic change. Greatest diameter measures 14 mm on the axial, 12 mm on the coronal, and 13 mm on the parasagittal images. The third ventricle measures 14 mm in width, with no comparison available. Questionable prominence of the anterior horns of the lateral ventricles with no comparison. Gray-white differentiation maintained.  No midline shift. Vascular: Unremarkable Skull: Surgical changes of right temporal craniotomy. Sinuses/Orbits: No acute finding. Other: None IMPRESSION: Rounded hyperdense focus associated with the right cerebral peduncle. Differential includes intra-axial mass (potentially with cystic change), as well evolving focus of intracerebral hemorrhage. Given the appearance of possible early hydrocephalus and patient's  history of treatment for prior tumor, correlation with any available prior imaging is recommended to evaluate for interval change. If no imaging is available for comparison, referral for neurologic evaluation and possible contrast-enhanced MRI recommended. Surgical changes of prior temporal craniotomy. These results were discussed by telephone at the time of interpretation on 07/23/2018 at 3:35 pm with KELLY GEKAS. Electronically Signed   By: Corrie Mckusick D.O.   On: 07/23/2018 15:39   Mr Cervical Spine W Wo Contrast  Result Date: 07/29/2018 CLINICAL DATA:  Radiculopathy.  Bilateral hand numbness and tingling EXAM: MRI CERVICAL SPINE WITHOUT AND WITH CONTRAST TECHNIQUE: Multiplanar and multiecho pulse sequences of the cervical spine, to include the craniocervical junction and cervicothoracic junction, were obtained without and with intravenous contrast. CONTRAST:  5 mL Gadavist COMPARISON:  None. FINDINGS: Alignment: Physiologic. Vertebrae: No fracture, evidence of discitis, or bone lesion. Cord: Normal signal and morphology. Posterior Fossa, vertebral arteries, paraspinal tissues: Posterior fossa demonstrates no focal abnormality. Vertebral artery flow voids are maintained. Paraspinal soft tissues are unremarkable. Complete opacification of maxillary sinus. Disc levels: Discs: Disc spaces are maintained. C2-3: No significant disc bulge. No neural foraminal stenosis. No central canal stenosis. C3-4: No significant disc bulge. No neural foraminal stenosis. No central canal stenosis. C4-5: Minimal broad-based disc bulge. No neural foraminal stenosis. No central canal stenosis. C5-6: No significant disc bulge. No neural foraminal stenosis. No central canal stenosis. C6-7: No significant disc bulge. No neural foraminal stenosis. No central canal stenosis. C7-T1: No significant disc bulge. No neural foraminal stenosis. No central canal stenosis. IMPRESSION: 1. No cervical spine disc protrusion, foraminal stenosis or  central canal stenosis. 2.  No acute osseous injury of the cervical spine. Electronically Signed   By: Kathreen Devoid   On: 07/29/2018 11:57   Dg Chest Port 1 View  Result Date: 07/23/2018 CLINICAL DATA:  Generalized body aches worsening over the last week. EXAM: PORTABLE CHEST 1 VIEW COMPARISON:  None. FINDINGS: 1417 hours. The lungs are clear without focal pneumonia, edema, pneumothorax or pleural effusion. The cardiopericardial silhouette is within normal limits for size. The visualized bony structures of the thorax are intact. Telemetry leads overlie the chest. IMPRESSION: No active disease. Electronically Signed   By: Misty Stanley M.D.   On: 07/23/2018 15:01      Domenic Moras, PA-C 07/29/18 [id number]    Blanchie Dessert, MD 07/29/18 [phone]

## 2018-07-29 NOTE — ED Notes (Signed)
PA made aware of trending HR at time of discharge.

## 2018-07-30 LAB — URINE CULTURE: Culture: <10000 — AB

## 2018-08-04 ENCOUNTER — Emergency Department (HOSPITAL_COMMUNITY)
Admission: EM | Admit: 2018-08-04 | Discharge: 2018-08-05 | Disposition: A | Discharge status: Home / Self Care | Payer: Self-pay | Attending: Emergency Medicine | Admitting: Emergency Medicine

## 2018-08-04 ENCOUNTER — Other Ambulatory Visit: Payer: Self-pay

## 2018-08-04 ENCOUNTER — Encounter (HOSPITAL_COMMUNITY): Payer: Self-pay | Admitting: Emergency Medicine

## 2018-08-04 DIAGNOSIS — Z794 Long term (current) use of insulin: Secondary | ICD-10-CM | POA: Insufficient documentation

## 2018-08-04 DIAGNOSIS — Z79899 Other long term (current) drug therapy: Secondary | ICD-10-CM | POA: Insufficient documentation

## 2018-08-04 DIAGNOSIS — E1140 Type 2 diabetes mellitus with diabetic neuropathy, unspecified: Secondary | ICD-10-CM | POA: Insufficient documentation

## 2018-08-04 DIAGNOSIS — I10 Essential (primary) hypertension: Secondary | ICD-10-CM | POA: Insufficient documentation

## 2018-08-04 DIAGNOSIS — E1165 Type 2 diabetes mellitus with hyperglycemia: Secondary | ICD-10-CM | POA: Insufficient documentation

## 2018-08-04 DIAGNOSIS — R739 Hyperglycemia, unspecified: Secondary | ICD-10-CM

## 2018-08-04 DIAGNOSIS — G629 Polyneuropathy, unspecified: Secondary | ICD-10-CM

## 2018-08-04 LAB — CBG MONITORING, ED: Glucose-Capillary: 318 mg/dL — ABNORMAL HIGH (ref 70–99)

## 2018-08-04 MED ORDER — GABAPENTIN 300 MG PO CAPS
300.0000 mg | ORAL_CAPSULE | Freq: Once | ORAL | Status: AC
  Administered 2018-08-04: 300 mg via ORAL
  Filled 2018-08-04: qty 1

## 2018-08-04 MED ORDER — KETOROLAC TROMETHAMINE 30 MG/ML IJ SOLN
30.0000 mg | Freq: Once | INTRAMUSCULAR | Status: AC
  Administered 2018-08-04: 30 mg via INTRAVENOUS
  Filled 2018-08-04: qty 1

## 2018-08-04 MED ORDER — LABETALOL HCL 300 MG PO TABS
300.0000 mg | ORAL_TABLET | Freq: Once | ORAL | Status: AC
  Administered 2018-08-04: 300 mg via ORAL
  Filled 2018-08-04: qty 1

## 2018-08-04 MED ORDER — SODIUM CHLORIDE 0.9 % IV BOLUS (SEPSIS)
1000.0000 mL | Freq: Once | INTRAVENOUS | Status: AC
  Administered 2018-08-04: 1000 mL via INTRAVENOUS

## 2018-08-04 NOTE — ED Triage Notes (Signed)
Pt reports having numbness in arm and legs that has been diagnosed before as paraesthesia and was given Gabapentin but is now out.

## 2018-08-04 NOTE — ED Provider Notes (Addendum)
TIME SEEN: 11:28 PM  CHIEF COMPLAINT: Burning pain  HPI: Patient is a 28 year old female with history of hypertension, diabetes, stable astrocytoma who presents to the emergency department with a burning pain in her bilateral hands and feet that have been ongoing for weeks.  Was seen here on April 27 for the same and started on gabapentin.  States she was taking 300 mg 3 times a day and was given a prescription for 10 days worth.  States she is out of this medication and it was helping her symptoms.  No new numbness or weakness.  Has an appointment to see her primary care physician in 2 days.  [name] tachycardic and hypertensive here which appears to be chronic for patient.  [name] on labetalol 300 mg 3 times daily.  Last dose at 5 PM.  Reports compliance.  Blood sugar is 318.  States she just ate prior to arrival.  She states she is trying to work on getting her blood sugars under control.  No fevers, cough, vomiting, diarrhea.  No focal weakness.  ROS: See HPI Constitutional: no fever  Eyes: no drainage  ENT: no runny nose   Cardiovascular:  no chest pain  Resp: no SOB  GI: no vomiting GU: no dysuria Integumentary: no rash  Allergy: no hives  Musculoskeletal: no leg swelling  Neurological: no slurred speech ROS otherwise negative  PAST MEDICAL HISTORY/PAST SURGICAL HISTORY:  Past Medical History:  Diagnosis Date  . Brain tumor ([place]) 03/29/1995  . Cholesterosis   . Diabetes mellitus without complication ([place])   . Fatty liver   . Hypertension   . Pancreatitis   . Polycystic ovary syndrome     MEDICATIONS:  Prior to Admission medications   Medication Sig Start Date End Date Taking? Authorizing Provider  acetaminophen (TYLENOL) 500 MG tablet Take 500 mg by mouth every 6 (six) hours as needed for fever.   Yes [provider]  ALPHA LIPOIC ACID-BIOTIN PO Take 1 tablet by mouth daily.   Yes [provider]  CRANBERRY FRUIT PO Take 1 capsule by mouth 2 (two) times a  day.   Yes [provider]  gabapentin (NEURONTIN) 300 MG capsule Take 1 capsule (300 mg total) by mouth 3 (three) times daily. 07/23/18  Yes Recardo Evangelist, PA-C  ibuprofen (ADVIL,MOTRIN) 200 MG tablet Take 200-400 mg by mouth every 6 (six) hours as needed for moderate pain.    Yes [provider]  Insulin Glargine (TOUJEO SOLOSTAR) 300 UNIT/ML SOPN Inject 56 Units into the skin at bedtime.    Yes [provider]  insulin lispro (HUMALOG) 100 UNIT/ML injection Inject 0.1-0.15 mLs (10-15 Units total) into the skin 3 (three) times daily before meals. 07/23/18  Yes Recardo Evangelist, PA-C  labetalol (NORMODYNE) 300 MG tablet Take 300 mg by mouth 3 (three) times daily.   Yes [provider]  levOCARNitine L-Tartrate (L-CARNITINE) 500 MG CAPS Take 1,000 mg by mouth daily.   Yes [provider]  levothyroxine (SYNTHROID) 125 MCG tablet Take 125 mcg by mouth daily before breakfast.   Yes [provider]  metFORMIN (GLUCOPHAGE-XR) 500 MG 24 hr tablet Take 500 mg by mouth 2 (two) times a day.   Yes [provider]  Multiple Vitamin (MULTIVITAMIN IRON-FREE PO) Take 1 tablet by mouth daily.   Yes [provider]  niacin (NIASPAN) 1000 MG CR tablet Take 1,000 mg by mouth at bedtime.   Yes [provider]  OVER THE COUNTER  MEDICATION Take 2 mLs by mouth daily. Vitamin B complex Liquid   Yes [provider]    ALLERGIES:  Allergies  Allergen Reactions  . Clarithromycin Other (See Comments)    Upset stomach   . Shellfish Allergy Swelling    Pt states contrast in CT is okay  . Penicillins Rash    Has patient had a PCN reaction causing immediate rash, facial/tongue/throat swelling, SOB or lightheadedness with hypotension: Y Has patient had a PCN reaction causing severe rash involving mucus membranes or skin necrosis: Y Has patient had a PCN reaction that required hospitalization: N Has patient had a PCN reaction  occurring within the last 10 years: Y If all of the above answers are "NO", then may proceed with Cephalosporin use.   . Prednisone Rash    SOCIAL HISTORY:  Social History   Tobacco Use  . Smoking status: Never Smoker  . Smokeless tobacco: Never Used  Substance Use Topics  . Alcohol use: Never    Frequency: Never    FAMILY HISTORY: History reviewed. No pertinent family history.  EXAM: BP (!) 166/113   Pulse (!) 111   Temp 98 F (36.7 C) (Oral)   Resp 20   Ht 4' 9"  (1.448 m)   Wt 45.4 kg   LMP 08/03/2018   SpO2 100%   BMI 21.64 kg/m  CONSTITUTIONAL: Alert and oriented and responds appropriately to questions. Well-appearing; well-nourished, in no distress HEAD: Normocephalic EYES: Conjunctivae clear, pupils appear equal, EOMI ENT: normal nose; moist mucous membranes NECK: Supple, no meningismus, no nuchal rigidity, no LAD  CARD: Regular and tachycardic; S1 and S2 appreciated; no murmurs, no clicks, no rubs, no gallops RESP: Normal chest excursion without splinting or tachypnea; breath sounds clear and equal bilaterally; no wheezes, no rhonchi, no rales, no hypoxia or respiratory distress, speaking full sentences ABD/GI: Normal bowel sounds; non-distended; soft, non-tender, no rebound, no guarding, no peritoneal signs, no hepatosplenomegaly BACK:  The back appears normal and is non-tender to palpation, there is no CVA tenderness EXT: Normal ROM in all joints; non-tender to palpation; no edema; normal capillary refill; no cyanosis, no calf tenderness or swelling    SKIN: Normal color for age and race; warm; no rash NEURO: Moves all extremities equally, sensation to light touch intact diffusely, normal speech, no facial asymmetry PSYCH: The patient's mood and manner are appropriate. Grooming and personal hygiene are appropriate.  MEDICAL DECISION MAKING: Patient here with peripheral neuropathy.  States gabapentin was previously helping her symptoms but she is out of this  medication.  Requesting something for pain.  Will give Toradol, gabapentin.  [name] also tachycardic and hypertensive here which appears to be normal for her.  Will give dose of her home labetalol.  Blood sugars 318.  Will check labs, urine to ensure no signs of DKA and she agrees to this.  Will give IV fluids for hyperglycemia.  ED PROGRESS: Patient's CBC unremarkable.  Urine shows blood but no other sign of infection.  [name] currently menstruating.  No ketones in her urine.  Pregnancy test is negative.  BMP waslipemic and had to be sent to [facility].  Will run VBG.  Will recheck blood sugar after liter of IV fluids.  Pain not significantly improved with Toradol, gabapentin.  Given morphine in the ED.   Patient is still hyperglycemic but not in DKA.  Blood pressure and heart rate slowly improving as well.  I feel she is safe to be discharged home and will  discharge with enough gabapentin to make it to her primary care doctor's appointment tomorrow.  Have advised close monitoring of her blood pressure and blood glucose.  I feel she is safe to be discharged home with outpatient follow-up.   Bicarb level is 22 on her labs.  Measured lower at 18.8 on i-STAT.  I feel that the lab value is more accurate.  I do not think she is in DKA today.  pH is normal.  No ketones in her urine.  No vomiting, abdominal pain, tachypnea.  At this time, I do not feel there is any life-threatening condition present. I have reviewed and discussed all results (EKG, imaging, lab, urine as appropriate) and exam findings with patient/family. I have reviewed nursing notes and appropriate previous records.  I feel the patient is safe to be discharged home without further emergent workup and can continue workup as an outpatient as needed. Discussed usual and customary return precautions. Patient/family verbalize understanding and are comfortable with this plan.  Outpatient follow-up has been provided as needed. All questions have been  answered.    EKG Interpretation  Date/Time:  Saturday Aug 04 2018 23:21:50 EDT Ventricular Rate:  107 PR Interval:    QRS Duration: 87 QT Interval:  336 QTC Calculation: 449 R Axis:   72 Text Interpretation:  Sinus tachycardia Borderline T wave abnormalities No significant change since last tracing Confirmed by Ward, Cyril Mourning [phone]) on 08/04/2018 11:29:13 PM          Ward, Delice Bison, DO 08/05/18 Nixa, Delice Bison, DO 08/05/18 [id number]

## 2018-08-05 LAB — CBC
HCT: 32 % — ABNORMAL LOW (ref 36.0–46.0)
Hemoglobin: 11.9 g/dL — ABNORMAL LOW (ref 12.0–15.0)
MCH: 29.5 pg (ref 26.0–34.0)
MCHC: 37.2 g/dL — ABNORMAL HIGH (ref 30.0–36.0)
MCV: 79.4 fL — ABNORMAL LOW (ref 80.0–100.0)
Platelets: 247 10*3/uL (ref 150–400)
RBC: 4.03 MIL/uL (ref 3.87–5.11)
RDW: 15.8 % — ABNORMAL HIGH (ref 11.5–15.5)
WBC: 10.9 10*3/uL — ABNORMAL HIGH (ref 4.0–10.5)
nRBC: 0 % (ref 0.0–0.2)

## 2018-08-05 LAB — POCT I-STAT EG7
Acid-base deficit: 7 mmol/L — ABNORMAL HIGH (ref 0.0–2.0)
Bicarbonate: 18.8 mmol/L — ABNORMAL LOW (ref 20.0–28.0)
Calcium, Ion: 1.07 mmol/L — ABNORMAL LOW (ref 1.15–1.40)
HCT: 26 % — ABNORMAL LOW (ref 36.0–46.0)
Hemoglobin: 8.8 g/dL — ABNORMAL LOW (ref 12.0–15.0)
O2 Saturation: 99 %
Potassium: 3.3 mmol/L — ABNORMAL LOW (ref 3.5–5.1)
Sodium: 135 mmol/L (ref 135–145)
TCO2: 20 mmol/L — ABNORMAL LOW (ref 22–32)
pCO2, Ven: 38.8 mmHg — ABNORMAL LOW (ref 44.0–60.0)
pH, Ven: 7.292 (ref 7.250–7.430)
pO2, Ven: 130 mmHg — ABNORMAL HIGH (ref 32.0–45.0)

## 2018-08-05 LAB — BASIC METABOLIC PANEL
Anion gap: 16 — ABNORMAL HIGH (ref 5–15)
BUN: 20 mg/dL (ref 6–20)
CO2: 22 mmol/L (ref 22–32)
Calcium: 9.9 mg/dL (ref 8.9–10.3)
Chloride: 89 mmol/L — ABNORMAL LOW (ref 98–111)
Creatinine, Ser: 0.61 mg/dL (ref 0.44–1.00)
GFR calc Af Amer: >60 mL/min (ref >60)
GFR calc non Af Amer: >60 mL/min (ref >60)
Glucose, Bld: 327 mg/dL — ABNORMAL HIGH (ref 70–99)
Potassium: 3.8 mmol/L (ref 3.5–5.1)
Sodium: 127 mmol/L — ABNORMAL LOW (ref 135–145)

## 2018-08-05 LAB — URINALYSIS, ROUTINE W REFLEX MICROSCOPIC
Bilirubin Urine: NEGATIVE
Glucose, UA: >=500 mg/dL — AB
Ketones, ur: NEGATIVE mg/dL
Leukocytes,Ua: NEGATIVE
Nitrite: NEGATIVE
Protein, ur: 30 mg/dL — AB
RBC / HPF: >50 RBC/hpf — ABNORMAL HIGH (ref 0–5)
Specific Gravity, Urine: 1.015 (ref 1.005–1.030)
pH: 5 (ref 5.0–8.0)

## 2018-08-05 LAB — I-STAT BETA HCG BLOOD, ED (MC, WL, AP ONLY): I-stat hCG, quantitative: <5 m[IU]/mL (ref <5)

## 2018-08-05 LAB — CBG MONITORING, ED: Glucose-Capillary: 321 mg/dL — ABNORMAL HIGH (ref 70–99)

## 2018-08-05 MED ORDER — MORPHINE SULFATE (PF) 4 MG/ML IV SOLN
4.0000 mg | Freq: Once | INTRAVENOUS | Status: AC
  Administered 2018-08-05: 4 mg via INTRAVENOUS
  Filled 2018-08-05: qty 1

## 2018-08-05 MED ORDER — GABAPENTIN 300 MG PO CAPS: 300.0000 mg | ORAL_CAPSULE | Freq: Three times a day (TID) | ORAL | 0 refills | Status: DC

## 2018-08-05 NOTE — Discharge Instructions (Signed)
Please keep the appointment to follow-up with your primary care doctor on Monday.  I recommend tighter control of your blood pressure and blood glucose as this can contribute to worsening symptoms of neuropathy.  Your labs showed no acute emergent abnormality today.  You are not in DKA.  Please continue your that occasions as prescribed.

## 2018-08-15 ENCOUNTER — Other Ambulatory Visit: Payer: Self-pay | Admitting: Family Medicine

## 2018-08-28 DIAGNOSIS — E109 Type 1 diabetes mellitus without complications: Secondary | ICD-10-CM | POA: Diagnosis not present

## 2018-08-28 DIAGNOSIS — I10 Essential (primary) hypertension: Secondary | ICD-10-CM | POA: Diagnosis not present

## 2018-08-28 DIAGNOSIS — E039 Hypothyroidism, unspecified: Secondary | ICD-10-CM | POA: Diagnosis not present

## 2018-08-28 DIAGNOSIS — E282 Polycystic ovarian syndrome: Secondary | ICD-10-CM | POA: Diagnosis not present

## 2018-08-29 ENCOUNTER — Ambulatory Visit: Payer: BC Managed Care – PPO | Admitting: Cardiology

## 2018-08-29 ENCOUNTER — Encounter: Payer: Self-pay | Admitting: Cardiology

## 2018-08-29 ENCOUNTER — Ambulatory Visit: Payer: Self-pay | Admitting: Cardiology

## 2018-08-29 ENCOUNTER — Other Ambulatory Visit: Payer: Self-pay

## 2018-08-29 VITALS — BP 152/101 | HR 126 | Temp 98.2°F | Ht <= 58 in | Wt 103.7 lb

## 2018-08-29 DIAGNOSIS — R000 Tachycardia, unspecified: Secondary | ICD-10-CM

## 2018-08-29 DIAGNOSIS — E8881 Metabolic syndrome: Secondary | ICD-10-CM

## 2018-08-29 DIAGNOSIS — Z8679 Personal history of other diseases of the circulatory system: Secondary | ICD-10-CM

## 2018-08-29 DIAGNOSIS — R9431 Abnormal electrocardiogram [ECG] [EKG]: Secondary | ICD-10-CM

## 2018-08-29 DIAGNOSIS — E1065 Type 1 diabetes mellitus with hyperglycemia: Secondary | ICD-10-CM

## 2018-08-29 DIAGNOSIS — IMO0002 Reserved for concepts with insufficient information to code with codable children: Secondary | ICD-10-CM

## 2018-08-29 DIAGNOSIS — E881 Lipodystrophy, not elsewhere classified: Secondary | ICD-10-CM

## 2018-08-29 DIAGNOSIS — E781 Pure hyperglyceridemia: Secondary | ICD-10-CM

## 2018-08-29 DIAGNOSIS — I10 Essential (primary) hypertension: Secondary | ICD-10-CM

## 2018-08-29 MED ORDER — OLMESARTAN MEDOXOMIL-HCTZ 20-12.5 MG PO TABS: 1.0000 | ORAL_TABLET | Freq: Every day | ORAL | 1 refills | Status: DC

## 2018-08-29 NOTE — Progress Notes (Signed)
Primary Physician:  Christa See, FNP   Patient ID: Jennifer [name], female    DOB: Jun 29, 1990, 28 y.o.   MRN: [id number]  Subjective:    Chief Complaint  Patient presents with  . New Patient (Initial Visit)  . Triglycerides    leaky valve  . Hypertension    HPI: Jennifer [name]  is a 28 y.o. 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 [name] also following Dr. Chalmers Cater for diabetes.   Patient was previously living in [place] and moved to [place] approximately 1 year ago. She has residual right ptosis and left hemiparesis from her astrocytoma.  Patient has had several hospitalizations for pancreatitis. She was diagnosed with type 1 diabetes around age 62 and she states has always had difficulty in controlling it since diagnosis. Last HgbA1c is 12%. [name] careful with her diet. [name] very aware of her medical problems. She was previously followed by Endocrinology in [place] as well and was suspected to have lipodystrophy. Triglycerides have been as high as 15,000 in 2018. Last triglyceride level in April 2019 was 1471. Mother has high triglycerides in the 700-800 range and also has uncontrolled diabetes. She has been started on Crestor by her PCP, but has not yet started. [name] currently on Fenofibrate. Has previously been on Niacin and Vascepa; however, apparently did not respond to Niacin and has intolerance to Vascepa as she developed rash.   Patient reports that she has had tachycardia and hypertension for several years. [name] on Labetalol, but states that her heart rate has continued to be elevated. States she was previously followed by Cardiologist in [place] with serial echo for "leaky heart valve"; however, I could not find notes regarding this. She does report that last year she had a miscarriage and underwent D and C. States  during the procedure she had complications from her heart. Records of this are also not available. [name] noted to have undergone Cardiac MRI in May 2019 and noted to have LVEF of 45 % with myocarditis. She has occasional chest pain and shortness of breath that she states have been present for many years and she doesn't even pay it much attention now. Generally at rest. No leg swelling, PND, or orthopnea. She does have some occasional tingling/numbness in her upper and lower extremities.   Patient is a Special Ed. Teacher. She remains active and does exercise regularly that she tolerates well. No tobacco, alcohol, or drug use.   Past Medical History:  Diagnosis Date  . Brain tumor ([place]) 03/29/1995  . Cholesterosis   . Diabetes mellitus without complication ([place])   . Fatty liver   . Hypertension   . Lung disease    longevity long term  . Pancreatitis   . Polycystic ovary syndrome   . Thyroid disease     Past Surgical History:  Procedure Laterality Date  . ABDOMINAL SURGERY     pt states during miscarriage got her intestine  . BRAIN SURGERY    . EYE MUSCLE SURGERY Right 03/28/2014  . VENTRICULOSTOMY  03/28/1997    Social History   Socioeconomic History  . Marital status: Married    Spouse name: Not on file  . Number of children: 0  . Years of education: Not on file  . Highest education level: Not on file  Occupational History  . Not on file  Social  Needs  . Financial resource strain: Not on file  . Food insecurity:    Worry: Not on file    Inability: Not on file  . Transportation needs:    Medical: Not on file    Non-medical: Not on file  Tobacco Use  . Smoking status: Never Smoker  . Smokeless tobacco: Never Used  Substance and Sexual Activity  . Alcohol use: Never    Frequency: Never  . Drug use: Never  . Sexual activity: Not on file  Lifestyle  . Physical activity:    Days per week: Not on file    Minutes per session: Not on file  . Stress: Not on file   Relationships  . Social connections:    Talks on phone: Not on file    Gets together: Not on file    Attends religious service: Not on file    Active member of club or organization: Not on file    Attends meetings of clubs or organizations: Not on file    Relationship status: Not on file  . Intimate partner violence:    Fear of current or ex partner: Not on file    Emotionally abused: Not on file    Physically abused: Not on file    Forced sexual activity: Not on file  Other Topics Concern  . Not on file  Social History Narrative  . Not on file    Review of Systems  Constitution: Negative for decreased appetite, malaise/fatigue, weight gain and weight loss.  Eyes: Negative for visual disturbance.  Cardiovascular: Positive for chest pain (improved with being lyrica). Negative for claudication, dyspnea on exertion, leg swelling, orthopnea, palpitations and syncope.  Respiratory: Positive for shortness of breath (random times). Negative for hemoptysis and wheezing.   Endocrine: Negative for cold intolerance and heat intolerance.  Hematologic/Lymphatic: Does not bruise/bleed easily.  Skin: Negative for nail changes.  Musculoskeletal: Negative for muscle weakness and myalgias.  Gastrointestinal: Negative for abdominal pain, change in bowel habit, nausea and vomiting.  Neurological: Positive for numbness (right and left hand). Negative for difficulty with concentration, dizziness, focal weakness and headaches.  Psychiatric/Behavioral: Negative for altered mental status and suicidal ideas.  All other systems reviewed and are negative.     Objective:  Blood pressure (!) 152/101, pulse (!) 126, temperature 98.2 F (36.8 C), height 4' 9"  (1.448 m), weight 103 lb 11.2 oz (47 kg), last menstrual period 08/03/2018, SpO2 97 %. Body mass index is 22.44 kg/m.    Physical Exam  Constitutional: [name] oriented to person, place, and time. Vital signs are normal. She appears well-developed and  well-nourished.  HENT:  Head: Normocephalic and atraumatic.  Neck: Normal range of motion.  Cardiovascular: Regular rhythm, normal heart sounds and intact distal pulses. Tachycardia present.  Pulmonary/Chest: Effort normal and breath sounds normal. No accessory muscle usage. No respiratory distress.  Abdominal: Soft. Bowel sounds are normal.  Musculoskeletal: Normal range of motion.  Neurological: [name] alert and oriented to person, place, and time.  Skin: Skin is warm and dry.  Vitals reviewed.  Radiology: No results found.  Laboratory examination:    CMP Latest Ref Rng & Units 08/05/2018 08/04/2018 07/28/2018  Glucose 70 - 99 mg/dL - 327(H) 339(H)  BUN 6 - 20 mg/dL - 20 22(H)  Creatinine 0.44 - 1.00 mg/dL - 0.61 0.64  Sodium 135 - 145 mmol/L 135 127(L) 134(L)  Potassium 3.5 - 5.1 mmol/L 3.3(L) 3.8 4.5  Chloride 98 - 111 mmol/L - 89(L) 99  CO2 22 - 32 mmol/L - 22 20(L)  Calcium 8.9 - 10.3 mg/dL - 9.9 9.4  Total Protein 6.5 - 8.1 g/dL - - 7.1  Total Bilirubin 0.3 - 1.2 mg/dL - - 0.5  Alkaline Phos 38 - 126 U/L - - 68  AST 15 - 41 U/L - - 37  ALT 0 - 44 U/L - - 46(H)   CBC Latest Ref Rng & Units 08/05/2018 08/04/2018 07/28/2018  WBC 4.0 - 10.5 K/uL - 10.9(H) 9.1  Hemoglobin 12.0 - 15.0 g/dL 8.8(L) 11.9(L) 10.7(L)  Hematocrit 36.0 - 46.0 % 26.0(L) 32.0(L) 30.9(L)  Platelets 150 - 400 K/uL - 247 216   Lipid Panel  No results found for: CHOL, TRIG, HDL, CHOLHDL, VLDL, LDLCALC, LDLDIRECT HEMOGLOBIN A1C No results found for: HGBA1C, MPG TSH No results for input(s): TSH in the last 8760 hours.  PRN Meds:. Medications Discontinued During This Encounter  Medication Reason  . gabapentin (NEURONTIN) 300 MG capsule Change in therapy  . gabapentin (NEURONTIN) 779 MG capsule Duplicate  . niacin (NIASPAN) 1000 MG CR tablet Change in therapy   Current Meds  Medication Sig  . acetaminophen (TYLENOL) 500 MG tablet Take 500 mg by mouth every 6 (six) hours as needed for fever.  . ALPHA  LIPOIC ACID-BIOTIN PO Take 1 tablet by mouth daily.  Marland Kitchen CRANBERRY FRUIT PO Take 1 capsule by mouth 2 (two) times a day.  . fenofibrate 160 MG tablet Take 160 mg by mouth daily.  Marland Kitchen ibuprofen (ADVIL,MOTRIN) 200 MG tablet Take 200-400 mg by mouth every 6 (six) hours as needed for moderate pain.   . Insulin Glargine (TOUJEO SOLOSTAR) 300 UNIT/ML SOPN Inject 75 Units into the skin at bedtime.   . insulin lispro (HUMALOG) 100 UNIT/ML injection Inject 0.1-0.15 mLs (10-15 Units total) into the skin 3 (three) times daily before meals. (Patient taking differently: Inject 5 Units into the skin 3 (three) times daily before meals. (1-8 sliding scale))  . labetalol (NORMODYNE) 300 MG tablet Take 300 mg by mouth 3 (three) times daily.  Marland Kitchen levOCARNitine L-Tartrate (L-CARNITINE) 500 MG CAPS Take 1,000 mg by mouth daily.  Marland Kitchen levothyroxine (SYNTHROID) 137 MCG tablet Take 137 mcg by mouth daily before breakfast.   . metFORMIN (GLUCOPHAGE-XR) 500 MG 24 hr tablet Take 500 mg by mouth every other day.   . Multiple Vitamin (MULTIVITAMIN IRON-FREE PO) Take 1 tablet by mouth daily.  Marland Kitchen OVER THE COUNTER MEDICATION Take 2 mLs by mouth daily. Vitamin B complex Liquid  . pregabalin (LYRICA) 200 MG capsule Take 200 mg by mouth 3 (three) times daily.  . rosuvastatin (CRESTOR) 5 MG tablet Take 5 mg by mouth at bedtime.    Cardiac Studies:   Cardiac MRI  08/04/2017:  Result Impression  1. The left ventricle is normal in size and has moderately decreased function. There is mid to basal inferoseptal hypokinesis. The right ventricle is normal in size and has mildly decreased function. 2. There is evidence of subepicardial enhancement of the mid inferoseptal wall segment, which is compatible with myocarditis. There is also a very small focal subendocardial enhancement in the mid inferoseptum which is compatible with infarction.  3. There is evidence of myocardial edema of the mid epicardial inferoseptum which is compatible with acute  myocarditis. 4. Small circumferential pericardial effusion   Assessment:   High triglycerides - Plan: fenofibrate 160 MG tablet, rosuvastatin (CRESTOR) 5 MG tablet, Comprehensive Metabolic Panel (CMET), Comprehensive Metabolic Panel (CMET)  Sinus tachycardia - Plan: EKG 12-Lead, PCV  ECHOCARDIOGRAM COMPLETE, Comprehensive Metabolic Panel (CMET), Comprehensive Metabolic Panel (CMET)  Shortened PR interval  History of myocarditis  Acquired partial lipodystrophy  Uncontrolled type 1 diabetes mellitus with complication, with long-term current use of insulin (HCC)  Insulin resistance  Essential hypertension - Plan: olmesartan-hydrochlorothiazide (BENICAR HCT) 20-12.5 MG tablet  EKG 08/29/2018: Sinus tachycardia at the rate of 127 bpm with short PR interval.  Nonspecific ST depression in leads.  Normal QT interval.  - Abnormal ST but nondiagnostic for this age.   Recommendations:   Ms. Giulianna Rocha is a 28 year old Caucasian female referred to [place] for evaluation of hypertriglyceridemia. Insulin resistance related lipodystrophy and is also contributing to her hypertriglycerdemia. She was diagnosed with IDDM at age 71, triglyceride evaluation occurred around age 84 years.  Unfortunately she has severe allergies with drug rash to fish oil supplements including Vascepa which is highly purified and hence really no options.  She has been prescribed fenofibrate which she'll be starting soon but I doubt that this will impact her triglycerides much.  I do believe that unless insulin resistance is targeted, it'll be extremely difficult to control triglycerides.  She has history of myocarditis, etiology unknown, MRI was evaluated, she also has mild scarring in the myocardial tissue.  I do not know if she has had a stress test in the past.  I'll obtain a repeat echocardiogram, and we'll consider Lexiscan stress test on follow-up.  I will discuss with her endocrinologist Dr. Hassan Buckler regarding her  presentation.  I may need to obtain consultation from [facility] lipid clinic or [facility] lipid clinic.  She may be a candidate for ongoing clinical trials.  Markedly elevated blood pressure, will add olmesartan HCT.  Obtain labs in 10 days to 2 weeks.  Doubt she has FMD as her vascular examination was essentially normal without any bruit.  With regard to underlying sinus tachycardia, probably related to deconditioning and metabolic syndrome.  She does have short PR interval on the EKG but no syndrome associated with this.  Office visit after echocardiogram.   *I have discussed this case with Dr. Einar Gip and he personally examined the patient and participated in formulating the plan.*   Miquel Dunn, MSN, APRN, FNP-C [facility]. [place] Office: [phone] Fax: [phone]

## 2018-08-31 ENCOUNTER — Encounter: Payer: Self-pay | Admitting: Cardiology

## 2018-09-24 DIAGNOSIS — E039 Hypothyroidism, unspecified: Secondary | ICD-10-CM | POA: Diagnosis not present

## 2018-09-24 DIAGNOSIS — E282 Polycystic ovarian syndrome: Secondary | ICD-10-CM | POA: Diagnosis not present

## 2018-09-24 DIAGNOSIS — I10 Essential (primary) hypertension: Secondary | ICD-10-CM | POA: Diagnosis not present

## 2018-09-24 DIAGNOSIS — E781 Pure hyperglyceridemia: Secondary | ICD-10-CM | POA: Diagnosis not present

## 2018-10-03 ENCOUNTER — Ambulatory Visit (INDEPENDENT_AMBULATORY_CARE_PROVIDER_SITE_OTHER): Payer: BC Managed Care – PPO

## 2018-10-03 ENCOUNTER — Other Ambulatory Visit: Payer: Self-pay

## 2018-10-03 DIAGNOSIS — R000 Tachycardia, unspecified: Secondary | ICD-10-CM

## 2018-10-10 ENCOUNTER — Encounter: Payer: Self-pay | Admitting: Cardiology

## 2018-10-10 ENCOUNTER — Other Ambulatory Visit: Payer: Self-pay

## 2018-10-10 ENCOUNTER — Ambulatory Visit: Payer: BC Managed Care – PPO | Admitting: Cardiology

## 2018-10-10 VITALS — BP 110/95 | Ht <= 58 in | Wt 103.0 lb

## 2018-10-10 DIAGNOSIS — I429 Cardiomyopathy, unspecified: Secondary | ICD-10-CM

## 2018-10-10 DIAGNOSIS — E039 Hypothyroidism, unspecified: Secondary | ICD-10-CM | POA: Insufficient documentation

## 2018-10-10 DIAGNOSIS — I10 Essential (primary) hypertension: Secondary | ICD-10-CM

## 2018-10-10 DIAGNOSIS — E781 Pure hyperglyceridemia: Secondary | ICD-10-CM | POA: Diagnosis not present

## 2018-10-10 DIAGNOSIS — E1169 Type 2 diabetes mellitus with other specified complication: Secondary | ICD-10-CM | POA: Insufficient documentation

## 2018-10-10 DIAGNOSIS — E88819 Insulin resistance, unspecified: Secondary | ICD-10-CM

## 2018-10-10 DIAGNOSIS — R000 Tachycardia, unspecified: Secondary | ICD-10-CM

## 2018-10-10 DIAGNOSIS — E8881 Metabolic syndrome: Secondary | ICD-10-CM

## 2018-10-10 DIAGNOSIS — E119 Type 2 diabetes mellitus without complications: Secondary | ICD-10-CM

## 2018-10-10 HISTORY — DX: Type 2 diabetes mellitus without complications: E11.9

## 2018-10-10 HISTORY — DX: Hypothyroidism, unspecified: E03.9

## 2018-10-10 HISTORY — DX: Essential (primary) hypertension: I10

## 2018-10-10 MED ORDER — SPIRONOLACTONE 25 MG PO TABS: 12.5000 mg | ORAL_TABLET | Freq: Every day | ORAL | 1 refills | Status: DC

## 2018-10-10 NOTE — Progress Notes (Signed)
Primary Physician:  Patient, No Pcp Per   Patient ID: Jennifer [name], female    DOB: 05/28/1990, 28 y.o.   MRN: [id number]  Subjective:    Chief Complaint  Patient presents with  . Hypertension  . Follow-up    echo results   This visit type was conducted due to national recommendations for restrictions regarding the COVID-19 Pandemic (e.g. social distancing).  This format is felt to be most appropriate for this patient at this time.  All issues noted in this document were discussed and addressed.  No physical exam was performed (except for noted visual exam findings with Telehealth visits).  The patient has consented to conduct a Telehealth visit and understands insurance will be billed.   I discussed the limitations of evaluation and management by telemedicine and the availability of in person appointments. The patient expressed understanding and agreed to proceed.  Virtual Visit via Video Note is as below  I connected with Mrs. Quimby, on 10/16/18 at 0910 by a video enabled telemedicine application and verified that I am speaking with the correct person using two identifiers.     I have discussed with her regarding the safety during COVID Pandemic and steps and precautions including social distancing with the patient.    HPI: Miosha Behe  is a 28 y.o. 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  Due to elevated blood pressure, she was started on Olmesartan HCT. She underwent echocardiogram and now presents for follow up. She has stopped the Olmesartan HCT as she states that it made her feel bad and made her lightheaded.  She continues to work with Dr. Chalmers Cater for management of Diabetes. Last HgbA1c is 12%. [name] careful with her diet. [name] very aware of her medical problems. Triglycerides have been as high as 15,000 in 2018. Last  triglyceride level in April 2019 was 1471. Mother has high triglycerides in the 700-800 range and also has uncontrolled diabetes. [name] currently on Fenofibrate. She had recently been given Crestor, but has not started yet due to being fearful. States that with statins in the past ended up with frequent pancreatitis. Has previously been on Niacin and Vascepa; however, apparently did not respond to Niacin and has intolerance to Vascepa as she developed rash.   Patient reports that she has had tachycardia and hypertension for several years. She has persistent tachycardia despite being on Labetalol. She does report that last year she had a miscarriage and underwent D and C. States during the procedure she had complications from her heart. Records of this are also not available. [name] noted to have undergone Cardiac MRI in May 2019 and noted to have LVEF of 45 % with myocarditis. She has occasional chest pain and shortness of breath that she states have been present for many years and she doesn't even pay it much attention now. Generally at rest. No leg swelling, PND, or orthopnea.   She does have some tingling/numbness in her upper and lower extremities, particularly lower, that have recently worsened. States that she has varying types of pain and did not sleep well last night due to this. She has reached out to Dr. Chalmers Cater regarding this and thinks she may need to see rheumatology. [name] concerned about continuing to use Lyrica and would like to wean off this.   Patient was previously living in [place] and moved to [place] approximately 1 year  ago. She has residual right ptosis and left hemiparesis from her astrocytoma.  Patient is a Special Ed. Teacher. She remains active and does exercise regularly that she tolerates well. No tobacco, alcohol, or drug use.   Past Medical History:  Diagnosis Date  . Brain tumor ([place]) 03/29/1995  . Cholesterosis   . Diabetes mellitus without complication ([place])   .  DM (diabetes mellitus) ([place]) 10/10/2018  . Fatty liver   . HTN (hypertension) 10/10/2018  . Hypertension   . Hypothyroidism 10/10/2018  . Lung disease    longevity long term  . Pancreatitis   . Polycystic ovary syndrome   . Thyroid disease     Past Surgical History:  Procedure Laterality Date  . ABDOMINAL SURGERY     pt states during miscarriage got her intestine  . BRAIN SURGERY    . EYE MUSCLE SURGERY Right 03/28/2014  . VENTRICULOSTOMY  03/28/1997    Social History   Socioeconomic History  . Marital status: Married    Spouse name: Not on file  . Number of children: 0  . Years of education: Not on file  . Highest education level: Not on file  Occupational History  . Not on file  Social Needs  . Financial resource strain: Not on file  . Food insecurity    Worry: Not on file    Inability: Not on file  . Transportation needs    Medical: Not on file    Non-medical: Not on file  Tobacco Use  . Smoking status: Never Smoker  . Smokeless tobacco: Never Used  Substance and Sexual Activity  . Alcohol use: Never    Frequency: Never  . Drug use: Never  . Sexual activity: Not on file  Lifestyle  . Physical activity    Days per week: Not on file    Minutes per session: Not on file  . Stress: Not on file  Relationships  . Social Herbalist on phone: Not on file    Gets together: Not on file    Attends religious service: Not on file    Active member of club or organization: Not on file    Attends meetings of clubs or organizations: Not on file    Relationship status: Not on file  . Intimate partner violence    Fear of current or ex partner: Not on file    Emotionally abused: Not on file    Physically abused: Not on file    Forced sexual activity: Not on file  Other Topics Concern  . Not on file  Social History Narrative  . Not on file    Review of Systems  Constitution: Negative for decreased appetite, malaise/fatigue, weight gain and weight loss.   Eyes: Negative for visual disturbance.  Cardiovascular: Positive for chest pain (improved with being lyrica). Negative for claudication, dyspnea on exertion, leg swelling, orthopnea, palpitations and syncope.  Respiratory: Positive for shortness of breath (random times). Negative for hemoptysis and wheezing.   Endocrine: Negative for cold intolerance and heat intolerance.  Hematologic/Lymphatic: Does not bruise/bleed easily.  Skin: Negative for nail changes.  Musculoskeletal: Negative for muscle weakness and myalgias.  Gastrointestinal: Negative for abdominal pain, change in bowel habit, nausea and vomiting.  Neurological: Positive for numbness (right and left hand) and paresthesias. Negative for difficulty with concentration, dizziness, focal weakness and headaches.  Psychiatric/Behavioral: Negative for altered mental status and suicidal ideas.  All other systems reviewed and are negative.     Objective:  Blood pressure (!) 110/95, height 4' 9"  (1.448 m), weight 103 lb (46.7 kg). Body mass index is 22.29 kg/m.    Physical exam not performed or limited due to virtual visit.  Patient appeared to be in no distress, Neck was supple, respiration was not labored.  Please see exam details from prior visit is as below.   Physical Exam  Constitutional: [name] oriented to person, place, and time. Vital signs are normal. She appears well-developed and well-nourished.  HENT:  Head: Normocephalic and atraumatic.  Neck: Normal range of motion.  Cardiovascular: Regular rhythm, normal heart sounds and intact distal pulses. Tachycardia present.  Pulmonary/Chest: Effort normal and breath sounds normal. No accessory muscle usage. No respiratory distress.  Abdominal: Soft. Bowel sounds are normal.  Musculoskeletal: Normal range of motion.  Neurological: [name] alert and oriented to person, place, and time.  Skin: Skin is warm and dry.  Vitals reviewed.  Radiology: No results found.  Laboratory  examination:    CMP Latest Ref Rng & Units 08/05/2018 08/04/2018 07/28/2018  Glucose 70 - 99 mg/dL - 327(H) 339(H)  BUN 6 - 20 mg/dL - 20 22(H)  Creatinine 0.44 - 1.00 mg/dL - 0.61 0.64  Sodium 135 - 145 mmol/L 135 127(L) 134(L)  Potassium 3.5 - 5.1 mmol/L 3.3(L) 3.8 4.5  Chloride 98 - 111 mmol/L - 89(L) 99  CO2 22 - 32 mmol/L - 22 20(L)  Calcium 8.9 - 10.3 mg/dL - 9.9 9.4  Total Protein 6.5 - 8.1 g/dL - - 7.1  Total Bilirubin 0.3 - 1.2 mg/dL - - 0.5  Alkaline Phos 38 - 126 U/L - - 68  AST 15 - 41 U/L - - 37  ALT 0 - 44 U/L - - 46(H)   CBC Latest Ref Rng & Units 08/05/2018 08/04/2018 07/28/2018  WBC 4.0 - 10.5 K/uL - 10.9(H) 9.1  Hemoglobin 12.0 - 15.0 g/dL 8.8(L) 11.9(L) 10.7(L)  Hematocrit 36.0 - 46.0 % 26.0(L) 32.0(L) 30.9(L)  Platelets 150 - 400 K/uL - 247 216   Lipid Panel  No results found for: CHOL, TRIG, HDL, CHOLHDL, VLDL, LDLCALC, LDLDIRECT HEMOGLOBIN A1C No results found for: HGBA1C, MPG TSH No results for input(s): TSH in the last 8760 hours.  PRN Meds:. Medications Discontinued During This Encounter  Medication Reason  . fenofibrate 160 MG tablet Discontinued by provider  . acetaminophen (TYLENOL) 500 MG tablet Patient Preference  . metFORMIN (GLUCOPHAGE-XR) 500 MG 24 hr tablet Discontinued by provider  . olmesartan-hydrochlorothiazide (BENICAR HCT) 20-12.5 MG tablet Discontinued by provider  . rosuvastatin (CRESTOR) 5 MG tablet Discontinued by provider   Current Meds  Medication Sig  . ALPHA LIPOIC ACID-BIOTIN PO Take 1 tablet by mouth daily.  Marland Kitchen CRANBERRY FRUIT PO Take 1 capsule by mouth 2 (two) times a day.  . ibuprofen (ADVIL,MOTRIN) 200 MG tablet Take 200-400 mg by mouth every 6 (six) hours as needed for moderate pain.   . Insulin Glargine (TOUJEO SOLOSTAR) 300 UNIT/ML SOPN Inject 25 Units into the skin at bedtime.   . insulin lispro (HUMALOG) 100 UNIT/ML injection Inject 0.1-0.15 mLs (10-15 Units total) into the skin 3 (three) times daily before meals. (Patient  taking differently: Inject 25 Units into the skin 3 (three) times daily before meals. (1-8 sliding scale))  . labetalol (NORMODYNE) 300 MG tablet Take 300 mg by mouth 3 (three) times daily.  Marland Kitchen levOCARNitine L-Tartrate (L-CARNITINE) 500 MG CAPS Take 1,000 mg by mouth daily.  Marland Kitchen levothyroxine (SYNTHROID) 137 MCG tablet Take 137 mcg by  mouth daily before breakfast.   . Multiple Vitamin (MULTIVITAMIN IRON-FREE PO) Take 1 tablet by mouth daily.  Marland Kitchen OVER THE COUNTER MEDICATION Take 2 mLs by mouth daily. Vitamin B complex Liquid  . pregabalin (LYRICA) 200 MG capsule Take 100 mg by mouth 3 (three) times daily.    Cardiac Studies:   Echocardiogram 10/04/2018: Mildly depressed LV systolic function with visual EF 40-45%. Left ventricle cavity is normal in size. Mild global hypokinesis. Doppler evidence of grade I (impaired) diastolic dysfunction, normal LAP.  Trileaflet aortic valve. Moderate (Grade II) aortic regurgitation. Mild to moderate mitral regurgitation. Mild tricuspid regurgitation. Estimated pulmonary artery systolic pressure is 31  mmHg.   Cardiac MRI  08/04/2017:  Result Impression  1. The left ventricle is normal in size and has moderately decreased function. There is mid to basal inferoseptal hypokinesis. The right ventricle is normal in size and has mildly decreased function. 2. There is evidence of subepicardial enhancement of the mid inferoseptal wall segment, which is compatible with myocarditis. There is also a very small focal subendocardial enhancement in the mid inferoseptum which is compatible with infarction.  3. There is evidence of myocardial edema of the mid epicardial inferoseptum which is compatible with acute myocarditis. 4. Small circumferential pericardial effusion   Assessment:     ICD-10-CM   1. Essential hypertension  I10   2. High triglycerides  E78.1   3. Sinus tachycardia  R00.0   4. Cardiomyopathy, unspecified type ([place])  I42.9   5. Insulin resistance   E88.81     EKG 08/29/2018: Sinus tachycardia at the rate of 127 bpm with short PR interval.  Nonspecific ST depression in leads.  Normal QT interval.  - Abnormal ST but nondiagnostic for this age.   Recommendations:   Ms. Congetta Odriscoll is a 28 year old Caucasian female recently evaluated by [place] for hypertriglyceridemia. Insulin resistance related lipodystrophy and is also contributing to her hypertriglycerdemia. She was diagnosed with IDDM at age 89, triglyceride evaluation occurred around age 60 years.  Unfortunately she has severe allergies with drug rash to fish oil supplements including Vascepa which is highly purified and hence really no options.  Continue to feel that until her insulin resistance is addressed her triglyceride levels will be difficult to control. Continue to feel that her best option may be consultation with [facility] lipid clinic or [facility] lipid clinic.  She may be a candidate for ongoing clinical trials. This will be discussed with Dr. Chalmers Cater.  She has history of myocarditis, etiology unknown, MRI was evaluated, she also has mild scarring in the myocardial tissue. She does have mildly depressed LVEF by recent echocardiogram that I suspect may be secondary to tachycardia and uncontrolled hypertension. She did not tolerate Olmesartan HCT. I will try Aldactone 25 mg daily as she has also had low potassium levels in the past. She would benefit from ARB or ACE inhibitor therapy in view of diabetes and if she tolerates aldactone, will retry her on plain Olmesartan both for hypertension and LV dysfunction. Will obtain BMP in 2 weeks for follow up on kidney function. Will consider stress testing once BP is better controlled.   With regard to underlying sinus tachycardia, probably related to deconditioning and metabolic syndrome.  She does have short PR interval on the EKG but no syndrome associated with this. I will see her back in 4-6 weeks for follow up on hypertension.     Miquel Dunn, MSN, APRN, FNP-C [facility]. [place] Office: [phone] Fax: [phone]

## 2018-10-16 ENCOUNTER — Encounter: Payer: Self-pay | Admitting: Cardiology

## 2018-10-17 DIAGNOSIS — M25522 Pain in left elbow: Secondary | ICD-10-CM | POA: Diagnosis not present

## 2018-10-19 ENCOUNTER — Encounter (HOSPITAL_COMMUNITY): Payer: Self-pay | Admitting: Emergency Medicine

## 2018-10-19 ENCOUNTER — Emergency Department (HOSPITAL_COMMUNITY): Payer: BC Managed Care – PPO

## 2018-10-19 ENCOUNTER — Other Ambulatory Visit: Payer: Self-pay

## 2018-10-19 ENCOUNTER — Emergency Department (HOSPITAL_COMMUNITY)
Admission: EM | Admit: 2018-10-19 | Discharge: 2018-10-19 | Disposition: A | Discharge status: Home / Self Care | Payer: BC Managed Care – PPO | Attending: Emergency Medicine | Admitting: Emergency Medicine

## 2018-10-19 DIAGNOSIS — Y92410 Unspecified street and highway as the place of occurrence of the external cause: Secondary | ICD-10-CM | POA: Insufficient documentation

## 2018-10-19 DIAGNOSIS — Z79899 Other long term (current) drug therapy: Secondary | ICD-10-CM | POA: Insufficient documentation

## 2018-10-19 DIAGNOSIS — R51 Headache: Secondary | ICD-10-CM | POA: Insufficient documentation

## 2018-10-19 DIAGNOSIS — E1165 Type 2 diabetes mellitus with hyperglycemia: Secondary | ICD-10-CM | POA: Diagnosis not present

## 2018-10-19 DIAGNOSIS — R000 Tachycardia, unspecified: Secondary | ICD-10-CM | POA: Diagnosis not present

## 2018-10-19 DIAGNOSIS — S4992XA Unspecified injury of left shoulder and upper arm, initial encounter: Secondary | ICD-10-CM | POA: Diagnosis not present

## 2018-10-19 DIAGNOSIS — I10 Essential (primary) hypertension: Secondary | ICD-10-CM | POA: Insufficient documentation

## 2018-10-19 DIAGNOSIS — E119 Type 2 diabetes mellitus without complications: Secondary | ICD-10-CM | POA: Insufficient documentation

## 2018-10-19 DIAGNOSIS — Z794 Long term (current) use of insulin: Secondary | ICD-10-CM | POA: Diagnosis not present

## 2018-10-19 DIAGNOSIS — M25571 Pain in right ankle and joints of right foot: Secondary | ICD-10-CM | POA: Diagnosis not present

## 2018-10-19 DIAGNOSIS — R52 Pain, unspecified: Secondary | ICD-10-CM | POA: Diagnosis not present

## 2018-10-19 DIAGNOSIS — M25551 Pain in right hip: Secondary | ICD-10-CM | POA: Insufficient documentation

## 2018-10-19 DIAGNOSIS — S199XXA Unspecified injury of neck, initial encounter: Secondary | ICD-10-CM | POA: Diagnosis not present

## 2018-10-19 DIAGNOSIS — Y999 Unspecified external cause status: Secondary | ICD-10-CM | POA: Diagnosis not present

## 2018-10-19 DIAGNOSIS — M25512 Pain in left shoulder: Secondary | ICD-10-CM | POA: Diagnosis not present

## 2018-10-19 DIAGNOSIS — S299XXA Unspecified injury of thorax, initial encounter: Secondary | ICD-10-CM | POA: Diagnosis not present

## 2018-10-19 DIAGNOSIS — R079 Chest pain, unspecified: Secondary | ICD-10-CM | POA: Diagnosis not present

## 2018-10-19 DIAGNOSIS — M25522 Pain in left elbow: Secondary | ICD-10-CM | POA: Diagnosis not present

## 2018-10-19 DIAGNOSIS — S79911A Unspecified injury of right hip, initial encounter: Secondary | ICD-10-CM | POA: Diagnosis not present

## 2018-10-19 DIAGNOSIS — M542 Cervicalgia: Secondary | ICD-10-CM | POA: Diagnosis not present

## 2018-10-19 DIAGNOSIS — Y93I9 Activity, other involving external motion: Secondary | ICD-10-CM | POA: Diagnosis not present

## 2018-10-19 DIAGNOSIS — E039 Hypothyroidism, unspecified: Secondary | ICD-10-CM | POA: Insufficient documentation

## 2018-10-19 DIAGNOSIS — S0990XA Unspecified injury of head, initial encounter: Secondary | ICD-10-CM | POA: Diagnosis not present

## 2018-10-19 DIAGNOSIS — S59902A Unspecified injury of left elbow, initial encounter: Secondary | ICD-10-CM | POA: Diagnosis not present

## 2018-10-19 LAB — I-STAT BETA HCG BLOOD, ED (MC, WL, AP ONLY): I-stat hCG, quantitative: <5 m[IU]/mL (ref <5)

## 2018-10-19 MED ORDER — METHOCARBAMOL 500 MG PO TABS: 500.0000 mg | ORAL_TABLET | Freq: Two times a day (BID) | ORAL | 0 refills | Status: DC

## 2018-10-19 MED ORDER — ACETAMINOPHEN 500 MG PO TABS: 500.0000 mg | ORAL_TABLET | Freq: Four times a day (QID) | ORAL | 0 refills | Status: DC | PRN

## 2018-10-19 MED ORDER — KETOROLAC TROMETHAMINE 30 MG/ML IJ SOLN
30.0000 mg | Freq: Once | INTRAMUSCULAR | Status: AC
  Administered 2018-10-19: 30 mg via INTRAVENOUS
  Filled 2018-10-19: qty 1

## 2018-10-19 MED ORDER — MORPHINE SULFATE (PF) 4 MG/ML IV SOLN
4.0000 mg | Freq: Once | INTRAVENOUS | Status: AC
  Administered 2018-10-19: 4 mg via INTRAVENOUS
  Filled 2018-10-19: qty 1

## 2018-10-19 MED ORDER — IBUPROFEN 600 MG PO TABS: 600.0000 mg | ORAL_TABLET | Freq: Four times a day (QID) | ORAL | 0 refills | Status: DC | PRN

## 2018-10-19 NOTE — ED Notes (Signed)
Patient was given discharge instructions and verbalized understanding. Patient was taken out of ED w/ wheelchair by this writer.
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
## 2018-10-23 DIAGNOSIS — D649 Anemia, unspecified: Secondary | ICD-10-CM | POA: Diagnosis not present

## 2018-10-23 DIAGNOSIS — E781 Pure hyperglyceridemia: Secondary | ICD-10-CM | POA: Diagnosis not present

## 2018-10-23 DIAGNOSIS — E871 Hypo-osmolality and hyponatremia: Secondary | ICD-10-CM | POA: Diagnosis not present

## 2018-10-23 DIAGNOSIS — J0100 Acute maxillary sinusitis, unspecified: Secondary | ICD-10-CM | POA: Diagnosis not present

## 2018-10-23 DIAGNOSIS — E1065 Type 1 diabetes mellitus with hyperglycemia: Secondary | ICD-10-CM | POA: Diagnosis not present

## 2018-10-23 DIAGNOSIS — B373 Candidiasis of vulva and vagina: Secondary | ICD-10-CM | POA: Diagnosis not present

## 2018-10-23 DIAGNOSIS — K861 Other chronic pancreatitis: Secondary | ICD-10-CM | POA: Diagnosis not present

## 2018-10-29 DIAGNOSIS — D649 Anemia, unspecified: Secondary | ICD-10-CM | POA: Diagnosis not present

## 2018-10-31 ENCOUNTER — Ambulatory Visit: Payer: BC Managed Care – PPO | Admitting: Neurology

## 2018-11-01 ENCOUNTER — Ambulatory Visit (INDEPENDENT_AMBULATORY_CARE_PROVIDER_SITE_OTHER): Payer: BC Managed Care – PPO | Admitting: Neurology

## 2018-11-01 ENCOUNTER — Encounter: Payer: Self-pay | Admitting: Neurology

## 2018-11-01 ENCOUNTER — Other Ambulatory Visit: Payer: Self-pay

## 2018-11-01 VITALS — BP 170/110 | HR 141 | Temp 97.0°F | Ht <= 58 in | Wt 100.0 lb

## 2018-11-01 DIAGNOSIS — C719 Malignant neoplasm of brain, unspecified: Secondary | ICD-10-CM | POA: Diagnosis not present

## 2018-11-01 DIAGNOSIS — H02401 Unspecified ptosis of right eyelid: Secondary | ICD-10-CM | POA: Diagnosis not present

## 2018-11-01 DIAGNOSIS — Z8739 Personal history of other diseases of the musculoskeletal system and connective tissue: Secondary | ICD-10-CM

## 2018-11-01 DIAGNOSIS — G8114 Spastic hemiplegia affecting left nondominant side: Secondary | ICD-10-CM

## 2018-11-01 DIAGNOSIS — E1140 Type 2 diabetes mellitus with diabetic neuropathy, unspecified: Secondary | ICD-10-CM | POA: Diagnosis not present

## 2018-11-01 DIAGNOSIS — R000 Tachycardia, unspecified: Secondary | ICD-10-CM

## 2018-11-01 NOTE — Progress Notes (Signed)
Subjective:    Patient ID: Jennifer [name] is a 28 y.o. female.  HPI     Star Age, MD, PhD [facility] [address], [address] [place], [place] [address]  Dear Dr. Chalmers Cater,   I saw your patient, Jennifer [name], upon your kind request in my neurologic clinic today for initial consultation of her paresthesias.  The patient is accompanied by her husband today.  As you know, Jennifer [name] is a 28 year old right-handed woman with an underlying medical history of type 1 diabetes, history of brain astrocytoma with status post surgery in 1997 and history of chemotherapy, hypertension, polycystic ovary disease, chronic pancreatitis, vitamin B12 deficiency and hypothyroidism, who reports numbness and tingling in both upper and lower extremities, particularly in the feet and legs for the past 4 months.  Symptoms started in April.  She has had a burning sensation in both feet in particular.  She has trouble sleeping at night because of the pain.  She reports that she was first tried on gabapentin which was then titrated to 600 mg 3 times daily.  This was not helping and therefore she was switched to Lyrica in May 2020.  This was titrated to the current dose of 300 mg 3 times daily.  She does not believe it is helpful.  She has noticed some tingling in her face.  She has residual weakness from her brain surgery as a child.  She has had stable left-sided hemiparesis and right ptosis as she needed eye surgery.  She had a diagnosis of grade 1 astrocytoma as she recalls.  She had surgery and chemotherapy as well.  She has been followed by neurosurgery and gets an MRI once a year but has not had her MRI this year.  They moved from [place] in August of last year and she has not established care with all her specialist providers yet.  She has not seen an ophthalmologist yet, she usually gets a diabetic eye exam once a year.  She has prescription eyeglasses.  She has full vision but  limited mobility in the right eye and because of the ptosis she can hardly see out of the right eye.  She has numbness in her fingertips.  She lives with her husband, they have no children, no pets in the house.  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  [name] a non-smoker and does not drink alcohol, caffeine and limitation, maybe every other day, 1 serving of coffee or tea.  She has been seeing cardiology, she has a history of tachycardia and hypertriglyceridemia and anemia.  She has seen hematology recently.  She had blood work on 10/29/2018 through her [place] and I reviewed her test results.  Hemoglobin was 10.5, BUN was 14, creatinine 0.7. I reviewed your office note from 09/24/2018.  Her A1c by fingerstick on 09/24/2018 was 12.7.  Her A1c prior to that was around 14 and when she was in [place] it used to be much higher at times.  She checked her blood sugar this morning and it was 400 she reports.  [name] scheduled to have blood work done in October through your office including TSH, lipid panel, B12, BMP, A1c, urinalysis.  She has seen occupational therapy and has been advised to try taping of the left forearm and elbow area secondary to elbow pain.   Her Past Medical History Is Significant For: Past Medical History:  Diagnosis Date  .  Astrocytoma ([place])   . Brain tumor ([name]) 03/29/1995  . Cholesterosis   . Diabetes mellitus without complication ([place])   . DM (diabetes mellitus) ([place]) 10/10/2018  . Fatty liver   . HTN (hypertension) 10/10/2018  . Hypertension   . Hypothyroidism 10/10/2018  . Hypothyroidism   . Lipoprotein deficiency   . Lung disease    longevity long term  . Pancreatitis   . Polycystic ovary syndrome   . Thyroid disease     Her Past Surgical History Is Significant For: Past Surgical History:  Procedure Laterality Date  . ABDOMINAL SURGERY     pt states during miscarriage got her intestine  . BRAIN SURGERY    .  EYE MUSCLE SURGERY Right 03/28/2014  . VENTRICULOSTOMY  03/28/1997    Her Family History Is Significant For: Family History  Problem Relation Age of Onset  . Diabetes Mother   . Hypertension Mother   . Hyperlipidemia Mother   . Thyroid disease Mother   . Hypertension Father   . Diabetes Father     Her Social History Is Significant For: Social History   Socioeconomic History  . Marital status: Married    Spouse name: Not on file  . Number of children: 0  . Years of education: Not on file  . Highest education level: Not on file  Occupational History  . Not on file  Social Needs  . Financial resource strain: Not on file  . Food insecurity    Worry: Not on file    Inability: Not on file  . Transportation needs    Medical: Not on file    Non-medical: Not on file  Tobacco Use  . Smoking status: Never Smoker  . Smokeless tobacco: Never Used  Substance and Sexual Activity  . Alcohol use: Never    Frequency: Never  . Drug use: Never  . Sexual activity: Not on file  Lifestyle  . Physical activity    Days per week: Not on file    Minutes per session: Not on file  . Stress: Not on file  Relationships  . Social Herbalist on phone: Not on file    Gets together: Not on file    Attends religious service: Not on file    Active member of club or organization: Not on file    Attends meetings of clubs or organizations: Not on file    Relationship status: Not on file  Other Topics Concern  . Not on file  Social History Narrative  . Not on file    Her Allergies Are:  Allergies  Allergen Reactions  . Clarithromycin Other (See Comments)    Upset stomach   . Shellfish Allergy Swelling    Pt states contrast in CT is okay  . Penicillins Rash    Has patient had a PCN reaction causing immediate rash, facial/tongue/throat swelling, SOB or lightheadedness with hypotension: Y Has patient had a PCN reaction causing severe rash involving mucus membranes or skin  necrosis: Y Has patient had a PCN reaction that required hospitalization: N Has patient had a PCN reaction occurring within the last 10 years: Y If all of the above answers are "NO", then may proceed with Cephalosporin use.   . Prednisone Rash  :   Her Current Medications Are:  Outpatient Encounter Medications as of 11/01/2018  Medication Sig  . acetaminophen (TYLENOL) 500 MG tablet Take 1 tablet (500 mg total) by mouth every 6 (six) hours as needed.  Marland Kitchen  Acetylcarnitine HCl (ACETYL L-CARNITINE) 500 MG CAPS Take 500 mg by mouth daily.  . ALPHA LIPOIC ACID-BIOTIN PO Take 200 mg by mouth daily.   Marland Kitchen CRANBERRY FRUIT PO Take 1 capsule by mouth 2 (two) times a day.  . Cyanocobalamin (B-12) 3000 MCG CAPS Take 1 capsule by mouth daily.  Marland Kitchen ibuprofen (ADVIL) 600 MG tablet Take 1 tablet (600 mg total) by mouth every 6 (six) hours as needed.  . Insulin Glargine, 1 Unit Dial, (TOUJEO SOLOSTAR) 300 UNIT/ML SOPN Inject 75 Units into the skin daily.  . insulin lispro (HUMALOG) 100 UNIT/ML injection Inject 0.1-0.15 mLs (10-15 Units total) into the skin 3 (three) times daily before meals.  Marland Kitchen labetalol (NORMODYNE) 300 MG tablet Take 300 mg by mouth 3 (three) times daily.  . pregabalin (LYRICA) 100 MG capsule Take 100 mg by mouth 3 (three) times daily.  . [DISCONTINUED] rosuvastatin (CRESTOR) 5 MG tablet Take 5 mg by mouth daily.   No facility-administered encounter medications on file as of 11/01/2018.   :   Review of Systems:  Out of a complete 14 point review of systems, all are reviewed and negative with the exception of these symptoms as listed below:   Review of Systems  Neurological:       Pt presents today to discuss her paresthesias. The paresthesias starting in April and are getting worse.     Objective:  Neurological Exam  Physical Exam Physical Examination:   Vitals:   11/01/18 1059  BP: (!) 170/110  Pulse: (!) 141  Temp: (!) 97 F (36.1 C)   General Examination: The patient is a  very pleasant 29 y.o. female in no acute distress. She appears well-developed and well-nourished and well groomed.   HEENT: Normocephalic, She has a circular scar in the right parietal and paracentral area in the front, right eye ptosis, minimal movement of the right eye, good pupillary reaction to the left eye and good movements of the left eye.  She has no dysarthria.  Airway examination reveals a small airway, slight deviation of the tongue to the right with tongue protrusion.  No obvious facial weakness.  No hearing loss noted.  She has no carotid bruits but is notably tachycardic, no distress, no sweating, no chest pain or shortness of breath reported.  She has no voice tremor.  Nuchal tone is normal.  She has no obvious facial numbness to PP/vib/temp.   Chest: Clear to auscultation without wheezing, rhonchi or crackles noted.  Heart: S1+S2+0, regular and normal without murmurs, rubs or gallops noted.   Abdomen: Soft, non-tender and non-distended with normal bowel sounds appreciated on auscultation.  Extremities: There is no pitting edema in the distal lower extremities bilaterally. Pedal pulses are intact.  Skin: Warm and dry without trophic changes noted. Feet are slightly colder than hands.  Musculoskeletal: exam reveals no obvious joint deformities, tenderness or joint swelling or erythema. She has spastic tone in the left upper extremity with weakness of the left hand, smaller caliber left upper and lower extremities, all chronic per patient's report.  Neurologically:  Mental status: The patient is awake, alert and oriented in all 4 spheres. Her immediate and remote memory, attention, language skills and fund of knowledge are appropriate. There is no evidence of aphasia, agnosia, apraxia or anomia. Speech is clear with normal prosody and enunciation. Thought process is linear. Mood is normal and affect is normal.  Cranial nerves II - XII are as described above under HEENT exam. In  addition: shoulder  shrug is normal with equal shoulder height noted. Motor exam: Thin built globally, strength is 5 out of 5 on the right with the exception of slight hip flexor weakness perhaps in the right.  She has 4 out of 5 weakness in the left with foot drop noted on the left and hand weakness more in the 3 out of 5 arena, spasticity noted on the left.  She has chronic hemiparesis on the left and this is not new per patient Romberg is negative. Reflexes are 2+ , Slightly stronger in the left upper extremity and absent in the left ankle.  Toes are downgoing.  Fine motor skills are preserved on the right side.  Cerebellar testing shows no dysmetria, no problems with intention tremor. Sensory exam: intact To light touch, pinprick, temperature and vibration sense in the upper extremities with the exception of decreased pinprick sensation in the fingertips bilaterally.  Also decreased sensation in the lower extremities around the mid shin areas, more noticeable in the left leg than right leg.[name] somewhat hypersensitive to pinprick in the left foot area.  Gait, station and balance: She stands with mild difficulty, she has an unequal hip height which is not new.  She states that the right leg is longer than left and she used to have a height adjustment on the left.  She walks with a obvious weakness on the left side, also foot drop on the left, states she had a foot brace in the past.  She has no walking aid.  Assessment and Plan:   In summary, Kassadi Presswood is a very pleasant 28 y.o.-year old female with an underlying medical history of type 1 diabetes, history of brain astrocytoma with status post surgery in 1997 and history of chemotherapy, hypertension, polycystic ovary disease, chronic pancreatitis, vitamin B12 deficiency and hypothyroidism, who  Presents for evaluation of her numbness, tingling and pain affecting all 4 extremities, particularly distal lower extremities bilaterally, left more  than right.  She has chronic and stable left-sided weakness, some spasticity in the left upper extremity, chronic and stable right ptosis, history of brain surgery at age 79, some new tingling noted in the left face.  Not apparent today however.  She has a slight deviation to tongue protrusion to the right, unclear if this is new or stable.  She used to follow yearly with a neurosurgeon up in [place] where she moved from about a year ago.  [name] strongly encouraged to establish care with a ophthalmologist for a diabetic eye exam which is also due for This year, in addition, she is advised to talk to her primary care physician/provider about establishing with a neurosurgeon and get plugged in for her yearly brain scans.  I will order a brain MRI with and without contrast.  In addition, we will evaluate her for neuropathy, diabetic painful neuropathy is likely the cause of her symptoms.  Unfortunately, there is not A whole lot I can offer her as she already has tried high-dose gabapentin and is on high-dose Lyrica currently.  Pain management would be the next option for her and she is encouraged to talk to her primary care about referral to pain management.  We will call her with her brain MRI results and EMG and nerve conduction test results.  I explained the test results to her and her husband Thedore Mins today. Optimization of diabetes control is key for her, Latest A1c was around 12, which has come down by her report.  [name] quite  tachycardic today but reports that this is normal for her.  [name] followed by cardiology and by hematology for her Chronic anemia.  Thank you very much for allowing me to participate in the care of this nice patient. If I can be of any further assistance to you please do not hesitate to call me at [phone].  Sincerely,   Star Age, MD, PhD

## 2018-11-01 NOTE — Patient Instructions (Signed)
I will order a brain MRI with and without contrast since you typically have a brain scan once a year.  I think it is important that you establish with a neurosurgeon locally.  Please talk to your primary care provider about referrals for: Neurosurgery, ophthalmology, pain management.  You have a history of painful neuropathy, we will proceed with an EMG and nerve conduction velocity test, which is an electrical nerve and muscle test, which we will schedule. We will call you with the results. Unfortunately, you had symptoms when you are on higher doses of gabapentin and you are already on Lyrica currently.  Your next step for your painful neuropathy symptoms would be management through pain management.   For now, we will keep you posted as to your test results by phone and follow-up as needed.

## 2018-11-05 ENCOUNTER — Telehealth: Payer: Self-pay | Admitting: Neurology

## 2018-11-05 NOTE — Telephone Encounter (Signed)
BCBS MDEK:[id number] (exp. 11/05/18 to 05/02/18) order sent to GI. They will reach out to the patient to schedule.

## 2018-11-06 DIAGNOSIS — Z01419 Encounter for gynecological examination (general) (routine) without abnormal findings: Secondary | ICD-10-CM | POA: Diagnosis not present

## 2018-11-06 DIAGNOSIS — E1065 Type 1 diabetes mellitus with hyperglycemia: Secondary | ICD-10-CM | POA: Diagnosis not present

## 2018-11-06 DIAGNOSIS — C719 Malignant neoplasm of brain, unspecified: Secondary | ICD-10-CM | POA: Diagnosis not present

## 2018-11-06 DIAGNOSIS — Z124 Encounter for screening for malignant neoplasm of cervix: Secondary | ICD-10-CM | POA: Diagnosis not present

## 2018-11-07 DIAGNOSIS — D649 Anemia, unspecified: Secondary | ICD-10-CM | POA: Diagnosis not present

## 2018-11-09 DIAGNOSIS — R29810 Facial weakness: Secondary | ICD-10-CM | POA: Diagnosis not present

## 2018-11-09 DIAGNOSIS — E119 Type 2 diabetes mellitus without complications: Secondary | ICD-10-CM | POA: Diagnosis not present

## 2018-11-09 DIAGNOSIS — I10 Essential (primary) hypertension: Secondary | ICD-10-CM | POA: Diagnosis not present

## 2018-11-09 DIAGNOSIS — Z79899 Other long term (current) drug therapy: Secondary | ICD-10-CM | POA: Diagnosis not present

## 2018-11-09 DIAGNOSIS — E079 Disorder of thyroid, unspecified: Secondary | ICD-10-CM | POA: Diagnosis not present

## 2018-11-09 DIAGNOSIS — R000 Tachycardia, unspecified: Secondary | ICD-10-CM | POA: Diagnosis not present

## 2018-11-09 DIAGNOSIS — G459 Transient cerebral ischemic attack, unspecified: Secondary | ICD-10-CM | POA: Diagnosis not present

## 2018-11-09 DIAGNOSIS — Z880 Allergy status to penicillin: Secondary | ICD-10-CM | POA: Diagnosis not present

## 2018-11-09 DIAGNOSIS — Z888 Allergy status to other drugs, medicaments and biological substances status: Secondary | ICD-10-CM | POA: Diagnosis not present

## 2018-11-09 DIAGNOSIS — R200 Anesthesia of skin: Secondary | ICD-10-CM | POA: Diagnosis not present

## 2018-11-09 DIAGNOSIS — R202 Paresthesia of skin: Secondary | ICD-10-CM | POA: Diagnosis not present

## 2018-11-09 DIAGNOSIS — Z91013 Allergy to seafood: Secondary | ICD-10-CM | POA: Diagnosis not present

## 2018-11-09 DIAGNOSIS — R110 Nausea: Secondary | ICD-10-CM | POA: Diagnosis not present

## 2018-11-09 DIAGNOSIS — Z883 Allergy status to other anti-infective agents status: Secondary | ICD-10-CM | POA: Diagnosis not present

## 2018-11-09 DIAGNOSIS — Z793 Long term (current) use of hormonal contraceptives: Secondary | ICD-10-CM | POA: Diagnosis not present

## 2018-11-09 DIAGNOSIS — Z794 Long term (current) use of insulin: Secondary | ICD-10-CM | POA: Diagnosis not present

## 2018-11-09 DIAGNOSIS — Z85841 Personal history of malignant neoplasm of brain: Secondary | ICD-10-CM | POA: Diagnosis not present

## 2018-11-19 DIAGNOSIS — E1159 Type 2 diabetes mellitus with other circulatory complications: Secondary | ICD-10-CM | POA: Diagnosis not present

## 2018-11-19 DIAGNOSIS — E1065 Type 1 diabetes mellitus with hyperglycemia: Secondary | ICD-10-CM | POA: Diagnosis not present

## 2018-11-19 DIAGNOSIS — R202 Paresthesia of skin: Secondary | ICD-10-CM | POA: Diagnosis not present

## 2018-11-19 DIAGNOSIS — G510 Bell's palsy: Secondary | ICD-10-CM | POA: Diagnosis not present

## 2018-11-19 DIAGNOSIS — I10 Essential (primary) hypertension: Secondary | ICD-10-CM | POA: Diagnosis not present

## 2018-11-20 ENCOUNTER — Ambulatory Visit: Payer: BC Managed Care – PPO | Admitting: Cardiology

## 2018-12-06 ENCOUNTER — Ambulatory Visit
Admission: RE | Admit: 2018-12-06 | Discharge: 2018-12-06 | Disposition: A | Discharge status: Home / Self Care | Payer: BC Managed Care – PPO | Source: Ambulatory Visit | Attending: Neurology | Admitting: Neurology

## 2018-12-06 DIAGNOSIS — E1140 Type 2 diabetes mellitus with diabetic neuropathy, unspecified: Secondary | ICD-10-CM | POA: Diagnosis not present

## 2018-12-06 DIAGNOSIS — G8114 Spastic hemiplegia affecting left nondominant side: Secondary | ICD-10-CM

## 2018-12-06 DIAGNOSIS — R000 Tachycardia, unspecified: Secondary | ICD-10-CM

## 2018-12-06 DIAGNOSIS — Z8739 Personal history of other diseases of the musculoskeletal system and connective tissue: Secondary | ICD-10-CM

## 2018-12-06 DIAGNOSIS — C719 Malignant neoplasm of brain, unspecified: Secondary | ICD-10-CM | POA: Diagnosis not present

## 2018-12-06 DIAGNOSIS — H02401 Unspecified ptosis of right eyelid: Secondary | ICD-10-CM

## 2018-12-06 MED ORDER — GADOBENATE DIMEGLUMINE 529 MG/ML IV SOLN
9.0000 mL | Freq: Once | INTRAVENOUS | Status: AC | PRN
  Administered 2018-12-06: 9 mL via INTRAVENOUS

## 2018-12-06 NOTE — Progress Notes (Signed)
Please call patient or her husband:  Brain MRI shows no obvious acute findings, stable, compared to her CT from May 2020. Mostly postsurgical and chronic changed.  Again, I do recommend, she establish care with a neurosurgeon, due to her brain astrocytoma history and need for consecutive MRIs down the road.   Proceed with EMG/NCV for painful neuropathy, later this month, we will call after the results are in.  [place]

## 2018-12-10 ENCOUNTER — Telehealth: Payer: Self-pay

## 2018-12-10 DIAGNOSIS — L859 Epidermal thickening, unspecified: Secondary | ICD-10-CM | POA: Diagnosis not present

## 2018-12-10 DIAGNOSIS — M2011 Hallux valgus (acquired), right foot: Secondary | ICD-10-CM | POA: Diagnosis not present

## 2018-12-10 DIAGNOSIS — E1042 Type 1 diabetes mellitus with diabetic polyneuropathy: Secondary | ICD-10-CM | POA: Diagnosis not present

## 2018-12-10 DIAGNOSIS — M2041 Other hammer toe(s) (acquired), right foot: Secondary | ICD-10-CM | POA: Diagnosis not present

## 2018-12-10 DIAGNOSIS — M2042 Other hammer toe(s) (acquired), left foot: Secondary | ICD-10-CM | POA: Diagnosis not present

## 2018-12-10 NOTE — Telephone Encounter (Signed)
I called pt to discuss her MRI results. No answer, left a message asking her to call me back. 

## 2018-12-10 NOTE — Telephone Encounter (Signed)
I called pt and discussed her MRI results and recommendations. Pt has not gotten the recommended referrals from her PCP. Pt verbalized understanding of results. Pt had no questions at this time but was encouraged to call back if questions arise.

## 2018-12-10 NOTE — Telephone Encounter (Signed)
Pt returned call. Please call back as soon as available.

## 2018-12-10 NOTE — Telephone Encounter (Signed)
-----   Message from Star Age, MD sent at 12/06/2018  7:15 PM EDT ----- Please call patient or her husband:  Brain MRI shows no obvious acute findings, stable, compared to her CT from May 2020. Mostly postsurgical and chronic changed.  Again, I do recommend, she establish care with a neurosurgeon, due to her brain astrocytoma history and need for consecutive MRIs down the road.   Proceed with EMG/NCV for painful neuropathy, later this month, we will call after the results are in.  [place]

## 2018-12-11 ENCOUNTER — Ambulatory Visit: Payer: BC Managed Care – PPO | Admitting: Cardiology

## 2018-12-18 DIAGNOSIS — H02204 Unspecified lagophthalmos left upper eyelid: Secondary | ICD-10-CM | POA: Diagnosis not present

## 2018-12-18 DIAGNOSIS — H35033 Hypertensive retinopathy, bilateral: Secondary | ICD-10-CM | POA: Diagnosis not present

## 2018-12-18 DIAGNOSIS — E109 Type 1 diabetes mellitus without complications: Secondary | ICD-10-CM | POA: Diagnosis not present

## 2018-12-18 DIAGNOSIS — H00024 Hordeolum internum left upper eyelid: Secondary | ICD-10-CM | POA: Diagnosis not present

## 2018-12-19 ENCOUNTER — Encounter (HOSPITAL_COMMUNITY): Payer: Self-pay | Admitting: Ophthalmology

## 2018-12-19 DIAGNOSIS — H35031 Hypertensive retinopathy, right eye: Secondary | ICD-10-CM | POA: Diagnosis not present

## 2018-12-19 DIAGNOSIS — E103412 Type 1 diabetes mellitus with severe nonproliferative diabetic retinopathy with macular edema, left eye: Secondary | ICD-10-CM | POA: Diagnosis not present

## 2018-12-19 DIAGNOSIS — H359 Unspecified retinal disorder: Secondary | ICD-10-CM | POA: Diagnosis not present

## 2018-12-19 DIAGNOSIS — E103411 Type 1 diabetes mellitus with severe nonproliferative diabetic retinopathy with macular edema, right eye: Secondary | ICD-10-CM | POA: Diagnosis not present

## 2018-12-19 DIAGNOSIS — H35032 Hypertensive retinopathy, left eye: Secondary | ICD-10-CM | POA: Diagnosis not present

## 2018-12-20 ENCOUNTER — Other Ambulatory Visit: Payer: Self-pay

## 2018-12-20 ENCOUNTER — Ambulatory Visit (INDEPENDENT_AMBULATORY_CARE_PROVIDER_SITE_OTHER): Payer: BC Managed Care – PPO | Admitting: Diagnostic Neuroimaging

## 2018-12-20 ENCOUNTER — Encounter (INDEPENDENT_AMBULATORY_CARE_PROVIDER_SITE_OTHER): Payer: BC Managed Care – PPO | Admitting: Diagnostic Neuroimaging

## 2018-12-20 DIAGNOSIS — E1140 Type 2 diabetes mellitus with diabetic neuropathy, unspecified: Secondary | ICD-10-CM

## 2018-12-20 DIAGNOSIS — Z8739 Personal history of other diseases of the musculoskeletal system and connective tissue: Secondary | ICD-10-CM

## 2018-12-20 DIAGNOSIS — G8114 Spastic hemiplegia affecting left nondominant side: Secondary | ICD-10-CM

## 2018-12-20 DIAGNOSIS — R000 Tachycardia, unspecified: Secondary | ICD-10-CM

## 2018-12-20 DIAGNOSIS — H02401 Unspecified ptosis of right eyelid: Secondary | ICD-10-CM

## 2018-12-20 DIAGNOSIS — H00024 Hordeolum internum left upper eyelid: Secondary | ICD-10-CM | POA: Diagnosis not present

## 2018-12-20 DIAGNOSIS — Z0289 Encounter for other administrative examinations: Secondary | ICD-10-CM

## 2018-12-20 DIAGNOSIS — C719 Malignant neoplasm of brain, unspecified: Secondary | ICD-10-CM

## 2018-12-22 DIAGNOSIS — E1165 Type 2 diabetes mellitus with hyperglycemia: Secondary | ICD-10-CM | POA: Diagnosis not present

## 2018-12-22 DIAGNOSIS — K760 Fatty (change of) liver, not elsewhere classified: Secondary | ICD-10-CM | POA: Diagnosis not present

## 2018-12-22 DIAGNOSIS — Z888 Allergy status to other drugs, medicaments and biological substances status: Secondary | ICD-10-CM | POA: Diagnosis not present

## 2018-12-22 DIAGNOSIS — K219 Gastro-esophageal reflux disease without esophagitis: Secondary | ICD-10-CM | POA: Diagnosis not present

## 2018-12-22 DIAGNOSIS — I10 Essential (primary) hypertension: Secondary | ICD-10-CM | POA: Diagnosis not present

## 2018-12-22 DIAGNOSIS — Z794 Long term (current) use of insulin: Secondary | ICD-10-CM | POA: Diagnosis not present

## 2018-12-22 DIAGNOSIS — Z85841 Personal history of malignant neoplasm of brain: Secondary | ICD-10-CM | POA: Diagnosis not present

## 2018-12-22 DIAGNOSIS — Z880 Allergy status to penicillin: Secondary | ICD-10-CM | POA: Diagnosis not present

## 2018-12-22 DIAGNOSIS — K589 Irritable bowel syndrome without diarrhea: Secondary | ICD-10-CM | POA: Diagnosis not present

## 2018-12-22 DIAGNOSIS — Z881 Allergy status to other antibiotic agents status: Secondary | ICD-10-CM | POA: Diagnosis not present

## 2018-12-22 DIAGNOSIS — E079 Disorder of thyroid, unspecified: Secondary | ICD-10-CM | POA: Diagnosis not present

## 2018-12-22 DIAGNOSIS — R202 Paresthesia of skin: Secondary | ICD-10-CM | POA: Diagnosis not present

## 2018-12-22 DIAGNOSIS — Z8669 Personal history of other diseases of the nervous system and sense organs: Secondary | ICD-10-CM | POA: Diagnosis not present

## 2018-12-22 DIAGNOSIS — E282 Polycystic ovarian syndrome: Secondary | ICD-10-CM | POA: Diagnosis not present

## 2018-12-22 DIAGNOSIS — R531 Weakness: Secondary | ICD-10-CM | POA: Diagnosis not present

## 2018-12-22 DIAGNOSIS — Z79899 Other long term (current) drug therapy: Secondary | ICD-10-CM | POA: Diagnosis not present

## 2018-12-26 ENCOUNTER — Encounter: Payer: Self-pay | Admitting: Neurology

## 2018-12-26 NOTE — Progress Notes (Signed)
Please call patient, recent EMG and nerve conduction test showed evidence of mild neuropathy, likely in keeping with her diabetes related neuropathy.  As discussed during our appt, for painful neuropathy, she has already been on high-dose gabapentin and Lyrica.  She was encouraged to talk to her primary care physician or PA about pain management as her next option.

## 2018-12-26 NOTE — Procedures (Signed)
[facility]  NCS (NERVE CONDUCTION STUDY) WITH EMG (ELECTROMYOGRAPHY) REPORT   STUDY DATE: 12/20/18 PATIENT NAME: Jennifer [name] DOB: [birth year] MRN: [id number]  ORDERING CLINICIAN: Star Age, MD PhD   TECHNOLOGIST: Sherre Scarlet  ELECTROMYOGRAPHER: Earlean Polka. , MD  CLINICAL INFORMATION: 28 year old female with upper and lower extremity numbness.  FINDINGS: NERVE CONDUCTION STUDY:  Left median, left ulnar, bilateral tibial motor responses are normal.  Left peroneal motor responses normal distal Lindsey, decreased amplitude, normal conduction velocity.  Right peroneal motor responses normal distal latency, normal amplitude, mildly slow conduction velocity.  Left sural and right superficial peroneal sensory sponsors have slightly decreased amplitudes.    Right sural, left superficial peroneal, left median and left ulnar sensory responses are normal.    NEEDLE ELECTROMYOGRAPHY:  Needle examination of right lower extremity is normal: Vastus medialis, tibialis anterior, gastrocnemius.   IMPRESSION:   Abnormal study demonstrating: - Mild length dependent, axonal sensorimotor polyneuropathy.     INTERPRETING PHYSICIAN:  Penni Bombard, MD Certified in Neurology, Neurophysiology and Neuroimaging  [facility] [address], [place], [place] [address] [phone]   [facility]    Nerve / Sites Muscle Latency Ref. Amplitude Ref. Rel Amp Segments Distance Velocity Ref. Area    ms ms mV mV %  cm m/s m/s mVms  L Median - APB     Wrist APB 4.0 ?4.4 5.8 ?4.0 100 Wrist - APB 7   10.3     Upper arm APB 7.1  5.8  101 Upper arm - Wrist 15 49 ?49 11.1  L Ulnar - ADM     Wrist ADM 2.3 ?3.3 8.2 ?6.0 100 Wrist - ADM 7   14.2     B.Elbow ADM 4.9  8.9  107 B.Elbow - Wrist 13 50 ?49 16.4     A.Elbow ADM 6.9  8.0  90 A.Elbow - B.Elbow 10 49 ?49 15.2         A.Elbow - Wrist      L Peroneal - EDB     Ankle EDB 4.8 ?6.5 1.7 ?2.0  100 Ankle - EDB 9   4.7     Fib head EDB 9.8  1.7  101 Fib head - Ankle 22 44 ?44 5.6     Pop fossa EDB 12.1  0.9  52 Pop fossa - Fib head 10 44 ?44 3.1         Pop fossa - Ankle      R Peroneal - EDB     Ankle EDB 6.4 ?6.5 2.1 ?2.0 100 Ankle - EDB 9   9.8     Fib head EDB 12.8  2.0  97.1 Fib head - Ankle 24 37 ?44 9.7     Pop fossa EDB 15.5  1.5  76 Pop fossa - Fib head 10 37 ?44 7.6         Pop fossa - Ankle      L Tibial - AH     Ankle AH 3.9 ?5.8 8.6 ?4.0 100 Ankle - AH 9   12.4     Pop fossa AH 12.0  6.9  79.9 Pop fossa - Ankle 33 41 ?41 11.6  R Tibial - AH     Ankle AH 3.6 ?5.8 8.5 ?4.0 100 Ankle - AH 9   18.3     Pop fossa AH 11.5  7.2  84.8 Pop fossa - Ankle 32 41 ?41 15.8  [place]    Nerve / Sites Rec. Site Peak Lat Ref.  Amp Ref. Segments Distance    ms ms V V  cm  L Sural - Ankle (Calf)     Calf Ankle 3.7 ?4.4 5 ?6 Calf - Ankle 14  R Sural - Ankle (Calf)     Calf Ankle 3.6 ?4.4 6 ?6 Calf - Ankle 14  L Superficial peroneal - Ankle     Lat leg Ankle 4.2 ?4.4 6 ?6 Lat leg - Ankle 14  R Superficial peroneal - Ankle     Lat leg Ankle 3.9 ?4.4 5 ?6 Lat leg - Ankle 14  L Median - Orthodromic (Dig II, Mid palm)     Dig II Wrist 3.4 ?3.4 10 ?10 Dig II - Wrist 13  L Ulnar - Orthodromic, (Dig V, Mid palm)     Dig V Wrist 2.6 ?3.1 7 ?5 Dig V - Wrist 56                  F  Wave    Nerve F Lat Ref.   ms ms  L Tibial - AH 49.2 ?56.0  L Ulnar - ADM 24.8 ?32.0  R Tibial - AH 50.0 ?56.0           EMG full       EMG Summary Table    Spontaneous MUAP Recruitment  Muscle IA Fib PSW Fasc Other Amp Dur. Poly Pattern  R. Vastus medialis Normal None None None _______ Normal Normal Normal Normal  R. Tibialis anterior Normal None None None _______ Normal Normal Normal Normal  R. Gastrocnemius (Medial head) Normal None None None _______ Normal Normal Normal Normal

## 2018-12-27 DIAGNOSIS — E103412 Type 1 diabetes mellitus with severe nonproliferative diabetic retinopathy with macular edema, left eye: Secondary | ICD-10-CM | POA: Diagnosis not present

## 2018-12-27 DIAGNOSIS — H35032 Hypertensive retinopathy, left eye: Secondary | ICD-10-CM | POA: Diagnosis not present

## 2019-01-03 DIAGNOSIS — H35031 Hypertensive retinopathy, right eye: Secondary | ICD-10-CM | POA: Diagnosis not present

## 2019-01-03 DIAGNOSIS — E103411 Type 1 diabetes mellitus with severe nonproliferative diabetic retinopathy with macular edema, right eye: Secondary | ICD-10-CM | POA: Diagnosis not present

## 2019-01-05 DIAGNOSIS — M25632 Stiffness of left wrist, not elsewhere classified: Secondary | ICD-10-CM | POA: Diagnosis not present

## 2019-01-05 DIAGNOSIS — G5632 Lesion of radial nerve, left upper limb: Secondary | ICD-10-CM | POA: Diagnosis not present

## 2019-01-05 DIAGNOSIS — Z880 Allergy status to penicillin: Secondary | ICD-10-CM | POA: Diagnosis not present

## 2019-01-05 DIAGNOSIS — M25532 Pain in left wrist: Secondary | ICD-10-CM | POA: Diagnosis not present

## 2019-01-05 DIAGNOSIS — I10 Essential (primary) hypertension: Secondary | ICD-10-CM | POA: Diagnosis not present

## 2019-01-05 DIAGNOSIS — E119 Type 2 diabetes mellitus without complications: Secondary | ICD-10-CM | POA: Diagnosis not present

## 2019-01-05 DIAGNOSIS — Z79899 Other long term (current) drug therapy: Secondary | ICD-10-CM | POA: Diagnosis not present

## 2019-01-05 DIAGNOSIS — Z881 Allergy status to other antibiotic agents status: Secondary | ICD-10-CM | POA: Diagnosis not present

## 2019-01-05 DIAGNOSIS — R000 Tachycardia, unspecified: Secondary | ICD-10-CM | POA: Diagnosis not present

## 2019-01-05 DIAGNOSIS — E079 Disorder of thyroid, unspecified: Secondary | ICD-10-CM | POA: Diagnosis not present

## 2019-01-05 DIAGNOSIS — Z91013 Allergy to seafood: Secondary | ICD-10-CM | POA: Diagnosis not present

## 2019-01-05 DIAGNOSIS — Z9102 Food additives allergy status: Secondary | ICD-10-CM | POA: Diagnosis not present

## 2019-01-05 DIAGNOSIS — Z794 Long term (current) use of insulin: Secondary | ICD-10-CM | POA: Diagnosis not present

## 2019-01-05 DIAGNOSIS — R202 Paresthesia of skin: Secondary | ICD-10-CM | POA: Diagnosis not present

## 2019-01-07 ENCOUNTER — Encounter: Payer: Self-pay | Admitting: Neurology

## 2019-01-07 DIAGNOSIS — E538 Deficiency of other specified B group vitamins: Secondary | ICD-10-CM | POA: Diagnosis not present

## 2019-01-07 DIAGNOSIS — R000 Tachycardia, unspecified: Secondary | ICD-10-CM | POA: Diagnosis not present

## 2019-01-07 DIAGNOSIS — E109 Type 1 diabetes mellitus without complications: Secondary | ICD-10-CM | POA: Diagnosis not present

## 2019-01-07 DIAGNOSIS — E039 Hypothyroidism, unspecified: Secondary | ICD-10-CM | POA: Diagnosis not present

## 2019-01-07 DIAGNOSIS — E781 Pure hyperglyceridemia: Secondary | ICD-10-CM | POA: Diagnosis not present

## 2019-01-07 DIAGNOSIS — I10 Essential (primary) hypertension: Secondary | ICD-10-CM | POA: Diagnosis not present

## 2019-01-07 DIAGNOSIS — E1142 Type 2 diabetes mellitus with diabetic polyneuropathy: Secondary | ICD-10-CM | POA: Diagnosis not present

## 2019-01-07 DIAGNOSIS — E1159 Type 2 diabetes mellitus with other circulatory complications: Secondary | ICD-10-CM | POA: Diagnosis not present

## 2019-01-07 DIAGNOSIS — Z23 Encounter for immunization: Secondary | ICD-10-CM | POA: Diagnosis not present

## 2019-01-14 DIAGNOSIS — I10 Essential (primary) hypertension: Secondary | ICD-10-CM | POA: Diagnosis not present

## 2019-01-14 DIAGNOSIS — E282 Polycystic ovarian syndrome: Secondary | ICD-10-CM | POA: Diagnosis not present

## 2019-01-14 DIAGNOSIS — E039 Hypothyroidism, unspecified: Secondary | ICD-10-CM | POA: Diagnosis not present

## 2019-01-14 DIAGNOSIS — E781 Pure hyperglyceridemia: Secondary | ICD-10-CM | POA: Diagnosis not present

## 2019-01-16 DIAGNOSIS — E1065 Type 1 diabetes mellitus with hyperglycemia: Secondary | ICD-10-CM | POA: Diagnosis not present

## 2019-01-21 DIAGNOSIS — J988 Other specified respiratory disorders: Secondary | ICD-10-CM | POA: Diagnosis not present

## 2019-01-21 DIAGNOSIS — I10 Essential (primary) hypertension: Secondary | ICD-10-CM | POA: Diagnosis not present

## 2019-01-21 DIAGNOSIS — Z20828 Contact with and (suspected) exposure to other viral communicable diseases: Secondary | ICD-10-CM | POA: Diagnosis not present

## 2019-01-21 DIAGNOSIS — E1159 Type 2 diabetes mellitus with other circulatory complications: Secondary | ICD-10-CM | POA: Diagnosis not present

## 2019-01-21 DIAGNOSIS — J0100 Acute maxillary sinusitis, unspecified: Secondary | ICD-10-CM | POA: Diagnosis not present

## 2019-01-28 DIAGNOSIS — E1042 Type 1 diabetes mellitus with diabetic polyneuropathy: Secondary | ICD-10-CM | POA: Diagnosis not present

## 2019-01-28 DIAGNOSIS — G894 Chronic pain syndrome: Secondary | ICD-10-CM | POA: Diagnosis not present

## 2019-01-28 DIAGNOSIS — C719 Malignant neoplasm of brain, unspecified: Secondary | ICD-10-CM | POA: Diagnosis not present

## 2019-01-29 DIAGNOSIS — H35032 Hypertensive retinopathy, left eye: Secondary | ICD-10-CM | POA: Diagnosis not present

## 2019-01-29 DIAGNOSIS — H359 Unspecified retinal disorder: Secondary | ICD-10-CM | POA: Diagnosis not present

## 2019-01-29 DIAGNOSIS — H3582 Retinal ischemia: Secondary | ICD-10-CM | POA: Diagnosis not present

## 2019-01-29 DIAGNOSIS — E103412 Type 1 diabetes mellitus with severe nonproliferative diabetic retinopathy with macular edema, left eye: Secondary | ICD-10-CM | POA: Diagnosis not present

## 2019-01-30 DIAGNOSIS — E611 Iron deficiency: Secondary | ICD-10-CM | POA: Diagnosis not present

## 2019-01-30 DIAGNOSIS — D649 Anemia, unspecified: Secondary | ICD-10-CM | POA: Diagnosis not present

## 2019-01-30 DIAGNOSIS — Z794 Long term (current) use of insulin: Secondary | ICD-10-CM | POA: Diagnosis not present

## 2019-01-30 DIAGNOSIS — R000 Tachycardia, unspecified: Secondary | ICD-10-CM | POA: Diagnosis not present

## 2019-01-30 DIAGNOSIS — E119 Type 2 diabetes mellitus without complications: Secondary | ICD-10-CM | POA: Diagnosis not present

## 2019-02-04 DIAGNOSIS — R000 Tachycardia, unspecified: Secondary | ICD-10-CM | POA: Diagnosis not present

## 2019-02-04 DIAGNOSIS — E119 Type 2 diabetes mellitus without complications: Secondary | ICD-10-CM | POA: Diagnosis not present

## 2019-02-04 DIAGNOSIS — Z794 Long term (current) use of insulin: Secondary | ICD-10-CM | POA: Diagnosis not present

## 2019-02-04 DIAGNOSIS — E611 Iron deficiency: Secondary | ICD-10-CM | POA: Diagnosis not present

## 2019-02-04 DIAGNOSIS — D649 Anemia, unspecified: Secondary | ICD-10-CM | POA: Diagnosis not present

## 2019-02-11 DIAGNOSIS — H35032 Hypertensive retinopathy, left eye: Secondary | ICD-10-CM | POA: Diagnosis not present

## 2019-02-11 DIAGNOSIS — E103412 Type 1 diabetes mellitus with severe nonproliferative diabetic retinopathy with macular edema, left eye: Secondary | ICD-10-CM | POA: Diagnosis not present

## 2019-02-11 DIAGNOSIS — E103411 Type 1 diabetes mellitus with severe nonproliferative diabetic retinopathy with macular edema, right eye: Secondary | ICD-10-CM | POA: Diagnosis not present

## 2019-02-11 DIAGNOSIS — K582 Mixed irritable bowel syndrome: Secondary | ICD-10-CM | POA: Diagnosis not present

## 2019-02-11 DIAGNOSIS — H359 Unspecified retinal disorder: Secondary | ICD-10-CM | POA: Diagnosis not present

## 2019-02-11 DIAGNOSIS — K219 Gastro-esophageal reflux disease without esophagitis: Secondary | ICD-10-CM | POA: Diagnosis not present

## 2019-02-11 DIAGNOSIS — K5900 Constipation, unspecified: Secondary | ICD-10-CM | POA: Diagnosis not present

## 2019-02-11 DIAGNOSIS — E781 Pure hyperglyceridemia: Secondary | ICD-10-CM | POA: Diagnosis not present

## 2019-02-11 DIAGNOSIS — R1084 Generalized abdominal pain: Secondary | ICD-10-CM | POA: Diagnosis not present

## 2019-02-11 DIAGNOSIS — D500 Iron deficiency anemia secondary to blood loss (chronic): Secondary | ICD-10-CM | POA: Diagnosis not present

## 2019-02-12 DIAGNOSIS — R000 Tachycardia, unspecified: Secondary | ICD-10-CM | POA: Diagnosis not present

## 2019-02-12 DIAGNOSIS — D649 Anemia, unspecified: Secondary | ICD-10-CM | POA: Diagnosis not present

## 2019-02-12 DIAGNOSIS — Z794 Long term (current) use of insulin: Secondary | ICD-10-CM | POA: Diagnosis not present

## 2019-02-12 DIAGNOSIS — E119 Type 2 diabetes mellitus without complications: Secondary | ICD-10-CM | POA: Diagnosis not present

## 2019-02-12 DIAGNOSIS — E611 Iron deficiency: Secondary | ICD-10-CM | POA: Diagnosis not present

## 2019-02-13 DIAGNOSIS — G8114 Spastic hemiplegia affecting left nondominant side: Secondary | ICD-10-CM | POA: Diagnosis not present

## 2019-02-13 DIAGNOSIS — C719 Malignant neoplasm of brain, unspecified: Secondary | ICD-10-CM | POA: Diagnosis not present

## 2019-02-13 DIAGNOSIS — H02431 Paralytic ptosis of right eyelid: Secondary | ICD-10-CM | POA: Diagnosis not present

## 2019-02-13 DIAGNOSIS — E1142 Type 2 diabetes mellitus with diabetic polyneuropathy: Secondary | ICD-10-CM | POA: Diagnosis not present

## 2019-02-18 DIAGNOSIS — E039 Hypothyroidism, unspecified: Secondary | ICD-10-CM | POA: Diagnosis not present

## 2019-02-18 DIAGNOSIS — E781 Pure hyperglyceridemia: Secondary | ICD-10-CM | POA: Diagnosis not present

## 2019-02-18 DIAGNOSIS — E103411 Type 1 diabetes mellitus with severe nonproliferative diabetic retinopathy with macular edema, right eye: Secondary | ICD-10-CM | POA: Diagnosis not present

## 2019-02-18 DIAGNOSIS — E7801 Familial hypercholesterolemia: Secondary | ICD-10-CM | POA: Diagnosis not present

## 2019-02-19 DIAGNOSIS — M6281 Muscle weakness (generalized): Secondary | ICD-10-CM | POA: Diagnosis not present

## 2019-02-19 DIAGNOSIS — M79602 Pain in left arm: Secondary | ICD-10-CM | POA: Diagnosis not present

## 2019-02-19 DIAGNOSIS — R2689 Other abnormalities of gait and mobility: Secondary | ICD-10-CM | POA: Diagnosis not present

## 2019-02-19 DIAGNOSIS — R262 Difficulty in walking, not elsewhere classified: Secondary | ICD-10-CM | POA: Diagnosis not present

## 2019-02-25 DIAGNOSIS — E781 Pure hyperglyceridemia: Secondary | ICD-10-CM | POA: Diagnosis not present

## 2019-02-25 DIAGNOSIS — E039 Hypothyroidism, unspecified: Secondary | ICD-10-CM | POA: Diagnosis not present

## 2019-02-25 DIAGNOSIS — I10 Essential (primary) hypertension: Secondary | ICD-10-CM | POA: Diagnosis not present

## 2019-02-25 DIAGNOSIS — E282 Polycystic ovarian syndrome: Secondary | ICD-10-CM | POA: Diagnosis not present

## 2019-02-26 DIAGNOSIS — K219 Gastro-esophageal reflux disease without esophagitis: Secondary | ICD-10-CM | POA: Diagnosis not present

## 2019-02-26 DIAGNOSIS — Z7984 Long term (current) use of oral hypoglycemic drugs: Secondary | ICD-10-CM | POA: Diagnosis not present

## 2019-02-26 DIAGNOSIS — Z91013 Allergy to seafood: Secondary | ICD-10-CM | POA: Diagnosis not present

## 2019-02-26 DIAGNOSIS — S93402A Sprain of unspecified ligament of left ankle, initial encounter: Secondary | ICD-10-CM | POA: Diagnosis not present

## 2019-02-26 DIAGNOSIS — E079 Disorder of thyroid, unspecified: Secondary | ICD-10-CM | POA: Diagnosis not present

## 2019-02-26 DIAGNOSIS — Z880 Allergy status to penicillin: Secondary | ICD-10-CM | POA: Diagnosis not present

## 2019-02-26 DIAGNOSIS — M25572 Pain in left ankle and joints of left foot: Secondary | ICD-10-CM | POA: Diagnosis not present

## 2019-02-26 DIAGNOSIS — W19XXXA Unspecified fall, initial encounter: Secondary | ICD-10-CM | POA: Diagnosis not present

## 2019-02-26 DIAGNOSIS — S99912A Unspecified injury of left ankle, initial encounter: Secondary | ICD-10-CM | POA: Diagnosis not present

## 2019-02-26 DIAGNOSIS — I10 Essential (primary) hypertension: Secondary | ICD-10-CM | POA: Diagnosis not present

## 2019-02-26 DIAGNOSIS — R000 Tachycardia, unspecified: Secondary | ICD-10-CM | POA: Diagnosis not present

## 2019-02-26 DIAGNOSIS — E119 Type 2 diabetes mellitus without complications: Secondary | ICD-10-CM | POA: Diagnosis not present

## 2019-02-26 DIAGNOSIS — Z881 Allergy status to other antibiotic agents status: Secondary | ICD-10-CM | POA: Diagnosis not present

## 2019-02-26 DIAGNOSIS — Z79899 Other long term (current) drug therapy: Secondary | ICD-10-CM | POA: Diagnosis not present

## 2019-02-26 DIAGNOSIS — G8911 Acute pain due to trauma: Secondary | ICD-10-CM | POA: Diagnosis not present

## 2019-02-27 DIAGNOSIS — E103412 Type 1 diabetes mellitus with severe nonproliferative diabetic retinopathy with macular edema, left eye: Secondary | ICD-10-CM | POA: Diagnosis not present

## 2019-02-28 DIAGNOSIS — M6281 Muscle weakness (generalized): Secondary | ICD-10-CM | POA: Diagnosis not present

## 2019-02-28 DIAGNOSIS — R262 Difficulty in walking, not elsewhere classified: Secondary | ICD-10-CM | POA: Diagnosis not present

## 2019-02-28 DIAGNOSIS — M79602 Pain in left arm: Secondary | ICD-10-CM | POA: Diagnosis not present

## 2019-02-28 DIAGNOSIS — R2689 Other abnormalities of gait and mobility: Secondary | ICD-10-CM | POA: Diagnosis not present

## 2019-03-05 DIAGNOSIS — M79602 Pain in left arm: Secondary | ICD-10-CM | POA: Diagnosis not present

## 2019-03-05 DIAGNOSIS — M6281 Muscle weakness (generalized): Secondary | ICD-10-CM | POA: Diagnosis not present

## 2019-03-05 DIAGNOSIS — R262 Difficulty in walking, not elsewhere classified: Secondary | ICD-10-CM | POA: Diagnosis not present

## 2019-03-05 DIAGNOSIS — R2689 Other abnormalities of gait and mobility: Secondary | ICD-10-CM | POA: Diagnosis not present

## 2019-03-07 DIAGNOSIS — M79602 Pain in left arm: Secondary | ICD-10-CM | POA: Diagnosis not present

## 2019-03-07 DIAGNOSIS — R262 Difficulty in walking, not elsewhere classified: Secondary | ICD-10-CM | POA: Diagnosis not present

## 2019-03-07 DIAGNOSIS — M6281 Muscle weakness (generalized): Secondary | ICD-10-CM | POA: Diagnosis not present

## 2019-03-07 DIAGNOSIS — R2689 Other abnormalities of gait and mobility: Secondary | ICD-10-CM | POA: Diagnosis not present

## 2019-03-11 DIAGNOSIS — M25572 Pain in left ankle and joints of left foot: Secondary | ICD-10-CM | POA: Diagnosis not present

## 2019-03-11 DIAGNOSIS — S99919A Unspecified injury of unspecified ankle, initial encounter: Secondary | ICD-10-CM | POA: Diagnosis not present

## 2019-03-11 DIAGNOSIS — G8114 Spastic hemiplegia affecting left nondominant side: Secondary | ICD-10-CM | POA: Diagnosis not present

## 2019-03-11 DIAGNOSIS — C719 Malignant neoplasm of brain, unspecified: Secondary | ICD-10-CM | POA: Diagnosis not present

## 2019-03-11 DIAGNOSIS — D649 Anemia, unspecified: Secondary | ICD-10-CM | POA: Diagnosis not present

## 2019-03-11 DIAGNOSIS — E611 Iron deficiency: Secondary | ICD-10-CM | POA: Diagnosis not present

## 2019-03-11 DIAGNOSIS — M7752 Other enthesopathy of left foot: Secondary | ICD-10-CM | POA: Diagnosis not present

## 2019-03-11 DIAGNOSIS — H4901 Third [oculomotor] nerve palsy, right eye: Secondary | ICD-10-CM | POA: Diagnosis not present

## 2019-03-12 DIAGNOSIS — M79602 Pain in left arm: Secondary | ICD-10-CM | POA: Diagnosis not present

## 2019-03-12 DIAGNOSIS — R2689 Other abnormalities of gait and mobility: Secondary | ICD-10-CM | POA: Diagnosis not present

## 2019-03-12 DIAGNOSIS — R262 Difficulty in walking, not elsewhere classified: Secondary | ICD-10-CM | POA: Diagnosis not present

## 2019-03-12 DIAGNOSIS — M6281 Muscle weakness (generalized): Secondary | ICD-10-CM | POA: Diagnosis not present

## 2019-03-13 DIAGNOSIS — E1042 Type 1 diabetes mellitus with diabetic polyneuropathy: Secondary | ICD-10-CM | POA: Diagnosis not present

## 2019-03-13 DIAGNOSIS — H02431 Paralytic ptosis of right eyelid: Secondary | ICD-10-CM | POA: Diagnosis not present

## 2019-03-13 DIAGNOSIS — C719 Malignant neoplasm of brain, unspecified: Secondary | ICD-10-CM | POA: Diagnosis not present

## 2019-03-13 DIAGNOSIS — G8114 Spastic hemiplegia affecting left nondominant side: Secondary | ICD-10-CM | POA: Diagnosis not present

## 2019-03-14 DIAGNOSIS — M6281 Muscle weakness (generalized): Secondary | ICD-10-CM | POA: Diagnosis not present

## 2019-03-14 DIAGNOSIS — R2689 Other abnormalities of gait and mobility: Secondary | ICD-10-CM | POA: Diagnosis not present

## 2019-03-14 DIAGNOSIS — M79602 Pain in left arm: Secondary | ICD-10-CM | POA: Diagnosis not present

## 2019-03-14 DIAGNOSIS — R262 Difficulty in walking, not elsewhere classified: Secondary | ICD-10-CM | POA: Diagnosis not present

## 2019-03-21 DIAGNOSIS — R2689 Other abnormalities of gait and mobility: Secondary | ICD-10-CM | POA: Diagnosis not present

## 2019-03-21 DIAGNOSIS — M6281 Muscle weakness (generalized): Secondary | ICD-10-CM | POA: Diagnosis not present

## 2019-03-21 DIAGNOSIS — R262 Difficulty in walking, not elsewhere classified: Secondary | ICD-10-CM | POA: Diagnosis not present

## 2019-03-21 DIAGNOSIS — M79602 Pain in left arm: Secondary | ICD-10-CM | POA: Diagnosis not present

## 2019-03-26 DIAGNOSIS — R2689 Other abnormalities of gait and mobility: Secondary | ICD-10-CM | POA: Diagnosis not present

## 2019-03-26 DIAGNOSIS — M79602 Pain in left arm: Secondary | ICD-10-CM | POA: Diagnosis not present

## 2019-03-26 DIAGNOSIS — M6281 Muscle weakness (generalized): Secondary | ICD-10-CM | POA: Diagnosis not present

## 2019-03-26 DIAGNOSIS — R262 Difficulty in walking, not elsewhere classified: Secondary | ICD-10-CM | POA: Diagnosis not present

## 2019-03-28 DIAGNOSIS — R2689 Other abnormalities of gait and mobility: Secondary | ICD-10-CM | POA: Diagnosis not present

## 2019-03-28 DIAGNOSIS — R262 Difficulty in walking, not elsewhere classified: Secondary | ICD-10-CM | POA: Diagnosis not present

## 2019-03-28 DIAGNOSIS — M6281 Muscle weakness (generalized): Secondary | ICD-10-CM | POA: Diagnosis not present

## 2019-03-28 DIAGNOSIS — M79602 Pain in left arm: Secondary | ICD-10-CM | POA: Diagnosis not present

## 2019-03-28 DIAGNOSIS — E1042 Type 1 diabetes mellitus with diabetic polyneuropathy: Secondary | ICD-10-CM | POA: Diagnosis not present

## 2019-03-28 DIAGNOSIS — S99919D Unspecified injury of unspecified ankle, subsequent encounter: Secondary | ICD-10-CM | POA: Diagnosis not present

## 2019-03-28 DIAGNOSIS — M25572 Pain in left ankle and joints of left foot: Secondary | ICD-10-CM | POA: Diagnosis not present

## 2019-04-04 DIAGNOSIS — M6281 Muscle weakness (generalized): Secondary | ICD-10-CM | POA: Diagnosis not present

## 2019-04-04 DIAGNOSIS — M79602 Pain in left arm: Secondary | ICD-10-CM | POA: Diagnosis not present

## 2019-04-04 DIAGNOSIS — R262 Difficulty in walking, not elsewhere classified: Secondary | ICD-10-CM | POA: Diagnosis not present

## 2019-04-04 DIAGNOSIS — R2689 Other abnormalities of gait and mobility: Secondary | ICD-10-CM | POA: Diagnosis not present

## 2019-04-08 ENCOUNTER — Encounter: Payer: Self-pay | Admitting: Cardiology

## 2019-04-08 ENCOUNTER — Ambulatory Visit: Payer: BC Managed Care – PPO | Admitting: Cardiology

## 2019-04-08 ENCOUNTER — Other Ambulatory Visit: Payer: Self-pay

## 2019-04-08 ENCOUNTER — Telehealth: Payer: Self-pay

## 2019-04-08 VITALS — BP 174/113 | HR 118 | Ht <= 58 in | Wt 121.0 lb

## 2019-04-08 DIAGNOSIS — R000 Tachycardia, unspecified: Secondary | ICD-10-CM | POA: Diagnosis not present

## 2019-04-08 DIAGNOSIS — E781 Pure hyperglyceridemia: Secondary | ICD-10-CM | POA: Diagnosis not present

## 2019-04-08 DIAGNOSIS — I429 Cardiomyopathy, unspecified: Secondary | ICD-10-CM

## 2019-04-08 DIAGNOSIS — I10 Essential (primary) hypertension: Secondary | ICD-10-CM

## 2019-04-08 DIAGNOSIS — E786 Lipoprotein deficiency: Secondary | ICD-10-CM

## 2019-04-08 MED ORDER — METOPROLOL TARTRATE 50 MG PO TABS: 50.0000 mg | ORAL_TABLET | Freq: Two times a day (BID) | ORAL | 1 refills | Status: DC

## 2019-04-08 NOTE — Progress Notes (Signed)
Primary Physician:  Sue Lush, PA-C   Patient ID: Jennifer [name], female    DOB: [birth year], 29 y.o.   MRN: [id number]  Subjective:    Chief Complaint  Patient presents with  . Hypertension  . Shortness of Breath  . Follow-up    HPI: Jennifer [name]  is a 29 y.o. 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  Patient was last seen virutally in July to discuss echo results. Echocardiogram at that time showed depressed LVEF at 40-45% and moderate AR. She was started on Aldactone 25 mg at that visit. She did not tolerate Olmesartan in the past due to dizziness. She made an appointment today as she has recently gained weight despite exercising more. Has had episodes of shortness of breath, feels as though she has a difficulty taking a deep breath. She has also noticed elevated heart rate and increased palpitations.  Patient reports that she has had tachycardia and hypertension for several years. She has persistent tachycardia despite being on Labetalol, was started on diltiazem 120 mg by PCP. States that Labetalol was later discontinued.   She continues to work with Dr. Chalmers Cater for management of Diabetes. States that her diabetes is much better controlled than previously. [name] careful with her diet. [name] very aware of her medical problems. Triglycerides have been as high as 15,000 in 2018. Last triglyceride level in April 2019 was 1471. Mother has high triglycerides in the 700-800 range and also has uncontrolled diabetes. [name] currently on Fenofibrate. States that with statins in the past ended up with frequent pancreatitis. Has previously been on Niacin and Vascepa; however, apparently did not respond to Niacin and has intolerance to Vascepa as she developed rash.   She has residual right ptosis and left hemiparesis from her  astrocytoma.  Patient is a Special Ed. Teacher. She remains active and does exercise regularly that she tolerates well. No tobacco, alcohol, or drug use.   Past Medical History:  Diagnosis Date  . Astrocytoma ([place])   . Brain tumor ([place]) 03/29/1995  . Cholesterosis   . Diabetes mellitus without complication ([place])   . DM (diabetes mellitus) ([place]) 10/10/2018  . Fatty liver   . HTN (hypertension) 10/10/2018  . Hypertension   . Hypothyroidism 10/10/2018  . Hypothyroidism   . Lipoprotein deficiency   . Lung disease    longevity long term  . Pancreatitis   . Polycystic ovary syndrome   . Thyroid disease     Past Surgical History:  Procedure Laterality Date  . ABDOMINAL SURGERY     pt states during miscarriage got her intestine  . BRAIN SURGERY    . EYE MUSCLE SURGERY Right 03/28/2014  . VENTRICULOSTOMY  03/28/1997    Social History   Socioeconomic History  . Marital status: Married    Spouse name: Not on file  . Number of children: 0  . Years of education: Not on file  . Highest education level: Not on file  Occupational History  . Not on file  Tobacco Use  . Smoking status: Never Smoker  . Smokeless tobacco: Never Used  Substance and Sexual Activity  . Alcohol use: Never  . Drug use: Never  . Sexual activity: Not on file  Other Topics Concern  . Not on file  Social History Narrative  . Not on file   Social Determinants of Health   Financial Resource  Strain:   . Difficulty of Paying Living Expenses: Not on file  Food Insecurity:   . Worried About Charity fundraiser in the Last Year: Not on file  . Ran Out of Food in the Last Year: Not on file  Transportation Needs:   . Lack of Transportation (Medical): Not on file  . Lack of Transportation (Non-Medical): Not on file  Physical Activity:   . Days of Exercise per Week: Not on file  . Minutes of Exercise per Session: Not on file  Stress:   . Feeling of Stress : Not on file  Social Connections:   . Frequency  of Communication with Friends and Family: Not on file  . Frequency of Social Gatherings with Friends and Family: Not on file  . Attends Religious Services: Not on file  . Active Member of Clubs or Organizations: Not on file  . Attends Archivist Meetings: Not on file  . Marital Status: Not on file  Intimate Partner Violence:   . Fear of Current or Ex-Partner: Not on file  . Emotionally Abused: Not on file  . Physically Abused: Not on file  . Sexually Abused: Not on file    Review of Systems  Constitution: Negative for decreased appetite, malaise/fatigue, weight gain and weight loss.  Eyes: Negative for visual disturbance.  Cardiovascular: Positive for chest pain (improved with being lyrica). Negative for claudication, dyspnea on exertion, leg swelling, orthopnea, palpitations and syncope.  Respiratory: Positive for shortness of breath (random times). Negative for hemoptysis and wheezing.   Endocrine: Negative for cold intolerance and heat intolerance.  Hematologic/Lymphatic: Does not bruise/bleed easily.  Skin: Negative for nail changes.  Musculoskeletal: Negative for muscle weakness and myalgias.  Gastrointestinal: Negative for abdominal pain, change in bowel habit, nausea and vomiting.  Neurological: Positive for numbness (right and left hand) and paresthesias. Negative for difficulty with concentration, dizziness, focal weakness and headaches.  Psychiatric/Behavioral: Negative for altered mental status and suicidal ideas.  All other systems reviewed and are negative.     Objective:  Blood pressure (!) 174/113, pulse (!) 118, height 4' 9"  (1.448 m), weight 121 lb (54.9 kg), SpO2 99 %. Body mass index is 26.18 kg/m.    Physical Exam  Constitutional: [name] oriented to person, place, and time. Vital signs are normal. She appears well-developed and well-nourished.  HENT:  Head: Normocephalic and atraumatic.  Cardiovascular: Regular rhythm and intact distal pulses.  Tachycardia present. Exam reveals gallop and S4.  Pulmonary/Chest: Effort normal and breath sounds normal. No accessory muscle usage. No respiratory distress.  Abdominal: Soft. Bowel sounds are normal.  Musculoskeletal:        General: Normal range of motion.     Cervical back: Normal range of motion.  Neurological: [name] alert and oriented to person, place, and time.  Skin: Skin is warm and dry.  Vitals reviewed.  Radiology: No results found.  Laboratory examination:    CMP Latest Ref Rng & Units 08/05/2018 08/04/2018 07/28/2018  Glucose 70 - 99 mg/dL - 327(H) 339(H)  BUN 6 - 20 mg/dL - 20 22(H)  Creatinine 0.44 - 1.00 mg/dL - 0.61 0.64  Sodium 135 - 145 mmol/L 135 127(L) 134(L)  Potassium 3.5 - 5.1 mmol/L 3.3(L) 3.8 4.5  Chloride 98 - 111 mmol/L - 89(L) 99  CO2 22 - 32 mmol/L - 22 20(L)  Calcium 8.9 - 10.3 mg/dL - 9.9 9.4  Total Protein 6.5 - 8.1 g/dL - - 7.1  Total Bilirubin 0.3 -  1.2 mg/dL - - 0.5  Alkaline Phos 38 - 126 U/L - - 68  AST 15 - 41 U/L - - 37  ALT 0 - 44 U/L - - 46(H)   CBC Latest Ref Rng & Units 08/05/2018 08/04/2018 07/28/2018  WBC 4.0 - 10.5 K/uL - 10.9(H) 9.1  Hemoglobin 12.0 - 15.0 g/dL 8.8(L) 11.9(L) 10.7(L)  Hematocrit 36.0 - 46.0 % 26.0(L) 32.0(L) 30.9(L)  Platelets 150 - 400 K/uL - 247 216   Lipid Panel  No results found for: CHOL, TRIG, HDL, CHOLHDL, VLDL, LDLCALC, LDLDIRECT HEMOGLOBIN A1C No results found for: HGBA1C, MPG TSH No results for input(s): TSH in the last 8760 hours.  PRN Meds:. Medications Discontinued During This Encounter  Medication Reason  . acetaminophen (TYLENOL) 500 MG tablet Change in therapy  . labetalol (NORMODYNE) 300 MG tablet Discontinued by provider   Current Meds  Medication Sig  . Acetylcarnitine HCl (ACETYL L-CARNITINE) 500 MG CAPS Take 500 mg by mouth daily.  Marland Kitchen albuterol (VENTOLIN HFA) 108 (90 Base) MCG/ACT inhaler Inhale into the lungs as needed.  . ALPHA LIPOIC ACID-BIOTIN PO Take 200 mg by mouth daily.   .  baclofen (LIORESAL) 10 MG tablet Take by mouth daily.  Marland Kitchen CRANBERRY FRUIT PO Take 1 capsule by mouth 2 (two) times a day.  . Cyanocobalamin (B-12) 3000 MCG CAPS Take 1 capsule by mouth daily.  . cyclobenzaprine (FLEXERIL) 10 MG tablet Take 10 mg by mouth as needed. for muscle spams  . dicyclomine (BENTYL) 10 MG capsule Take by mouth 3 (three) times daily as needed.  . diltiazem (CARDIZEM SR) 120 MG 12 hr capsule daily.  . DULoxetine (CYMBALTA) 60 MG capsule Take by mouth daily.  . famotidine (PEPCID) 20 MG tablet Take by mouth 2 (two) times daily.  . fenofibrate (TRICOR) 145 MG tablet Take 145 mg by mouth daily.  . fluconazole (DIFLUCAN) 150 MG tablet Take by mouth.  . fluticasone furoate-vilanterol (BREO ELLIPTA) 100-25 MCG/INH AEPB Inhale into the lungs as needed.  Marland Kitchen ibuprofen (ADVIL) 600 MG tablet Take 1 tablet (600 mg total) by mouth every 6 (six) hours as needed.  . Insulin Glargine, 1 Unit Dial, (TOUJEO SOLOSTAR) 300 UNIT/ML SOPN Inject 80 Units into the skin at bedtime. 24units in the am, 80u at bedtime  . insulin lispro (HUMALOG) 100 UNIT/ML injection Inject 0.1-0.15 mLs (10-15 Units total) into the skin 3 (three) times daily before meals. (Patient taking differently: Inject 25 Units into the skin 3 (three) times daily before meals. )  . metFORMIN (GLUCOPHAGE) 500 MG tablet daily.  . ondansetron (ZOFRAN) 4 MG tablet as needed.  . pregabalin (LYRICA) 300 MG capsule Take by mouth 3 (three) times daily.   Marland Kitchen spironolactone (ALDACTONE) 25 MG tablet Take by mouth daily.    Cardiac Studies:   Echocardiogram 10/04/2018: Mildly depressed LV systolic function with visual EF 40-45%. Left ventricle cavity is normal in size. Mild global hypokinesis. Doppler evidence of grade I (impaired) diastolic dysfunction, normal LAP.  Trileaflet aortic valve. Moderate (Grade II) aortic regurgitation. Mild to moderate mitral regurgitation. Mild tricuspid regurgitation. Estimated pulmonary artery systolic  pressure is 31  mmHg.   Cardiac MRI  08/04/2017:  Result Impression  1. The left ventricle is normal in size and has moderately decreased function. There is mid to basal inferoseptal hypokinesis. The right ventricle is normal in size and has mildly decreased function. 2. There is evidence of subepicardial enhancement of the mid inferoseptal wall segment, which is compatible with myocarditis. There  is also a very small focal subendocardial enhancement in the mid inferoseptum which is compatible with infarction.  3. There is evidence of myocardial edema of the mid epicardial inferoseptum which is compatible with acute myocarditis. 4. Small circumferential pericardial effusion   Assessment:     ICD-10-CM   1. Essential hypertension  I10 EKG 12-Lead    PCV ECHOCARDIOGRAM COMPLETE  2. Sinus tachycardia  R00.0   3. Cardiomyopathy, unspecified type ([place])  I42.9   4. Hypertriglyceridemia  E78.1   5. Lipoprotein lipase deficiency, familial  E78.6      EKG 04/08/2019: Sinus tachycardia at 117 bpm with short PR interval, normal axis, nonspecific ST abnormality. No significant changes compared to EKG 0603/2020  Recommendations:   Ms. Jennifer [name] is a 29 year old Caucasian female recently evaluated by [place] for hypertriglyceridemia. Insulin resistance related lipodystrophy and is also contributing to her hypertriglycerdemia. She was diagnosed with IDDM at age 59, triglyceride evaluation occurred around age 44 years.  Patient is here for acute visit today due to elevated heart rate and blood pressure.  She was previously on labetalol, but was later discontinued by her PCP after being started on diltiazem.  She has a history of sinus tachycardia, but generally not this elevated.  She has gained weight over the last few months that I suspect is contributing to her tachycardia, shortness of breath and elevated blood pressure as well.  She does have short PR interval on EKG without associated syndrome.   I will start metoprolol tartrate 50 mg twice daily.  She has a history of myocarditis with unknown etiology by previous MRI with mild scarring in the myocardial tissue.  Her last echocardiogram earlier this year showed mildly depressed LVEF likely related to tachycardia and uncontrolled hypertension.  I would recommend obtaining an echocardiogram hopefully in the next 1 to 2 weeks to reevaluate her LVEF.  She does have a gallop S4 present on exam.  Depending upon her EF, she may need Entresto therapy.  Continue with Aldactone and diltiazem for now.  I have strongly recommended that she make further diet changes to help with weight loss.  We discussed calorie restriction.  In regard to hypertriglyceridemia, I do feel that she would benefit from evaluation from [facility] given her history of hypertriglyceridemia and lipoprotein lipase deficiency.  Will place referral for this.  She continues to followed by Dr. Chalmers Cater for management of her diabetes and will certainly keep her informed in regards to our recommendation on referral to [facility].  Unfortunately she has severe allergies with drug rash to fish oil supplements including Vascepa which is highly purified and hence really no options. I will see her back in 2 weeks for close follow up.     *I have discussed this case with Dr. Einar Gip and he personally examined the patient and participated in formulating the plan.*  CC: Dr. Jacelyn Pi  Miquel Dunn, MSN, APRN, [facility]. [place] Office: [phone] Fax: [phone]

## 2019-04-09 DIAGNOSIS — R2689 Other abnormalities of gait and mobility: Secondary | ICD-10-CM | POA: Diagnosis not present

## 2019-04-09 DIAGNOSIS — M6281 Muscle weakness (generalized): Secondary | ICD-10-CM | POA: Diagnosis not present

## 2019-04-09 DIAGNOSIS — M79602 Pain in left arm: Secondary | ICD-10-CM | POA: Diagnosis not present

## 2019-04-09 DIAGNOSIS — R262 Difficulty in walking, not elsewhere classified: Secondary | ICD-10-CM | POA: Diagnosis not present

## 2019-04-09 NOTE — Telephone Encounter (Signed)
Forwarded message

## 2019-04-10 DIAGNOSIS — H35031 Hypertensive retinopathy, right eye: Secondary | ICD-10-CM | POA: Diagnosis not present

## 2019-04-10 DIAGNOSIS — H359 Unspecified retinal disorder: Secondary | ICD-10-CM | POA: Diagnosis not present

## 2019-04-10 DIAGNOSIS — H35032 Hypertensive retinopathy, left eye: Secondary | ICD-10-CM | POA: Diagnosis not present

## 2019-04-10 DIAGNOSIS — E103412 Type 1 diabetes mellitus with severe nonproliferative diabetic retinopathy with macular edema, left eye: Secondary | ICD-10-CM | POA: Diagnosis not present

## 2019-04-11 DIAGNOSIS — H3581 Retinal edema: Secondary | ICD-10-CM | POA: Diagnosis not present

## 2019-04-11 DIAGNOSIS — H35022 Exudative retinopathy, left eye: Secondary | ICD-10-CM | POA: Diagnosis not present

## 2019-04-11 DIAGNOSIS — C719 Malignant neoplasm of brain, unspecified: Secondary | ICD-10-CM | POA: Diagnosis not present

## 2019-04-11 DIAGNOSIS — E1065 Type 1 diabetes mellitus with hyperglycemia: Secondary | ICD-10-CM | POA: Diagnosis not present

## 2019-04-11 DIAGNOSIS — G8114 Spastic hemiplegia affecting left nondominant side: Secondary | ICD-10-CM | POA: Diagnosis not present

## 2019-04-11 DIAGNOSIS — M79602 Pain in left arm: Secondary | ICD-10-CM | POA: Diagnosis not present

## 2019-04-11 DIAGNOSIS — H34832 Tributary (branch) retinal vein occlusion, left eye, with macular edema: Secondary | ICD-10-CM | POA: Diagnosis not present

## 2019-04-11 DIAGNOSIS — R262 Difficulty in walking, not elsewhere classified: Secondary | ICD-10-CM | POA: Diagnosis not present

## 2019-04-11 DIAGNOSIS — H35033 Hypertensive retinopathy, bilateral: Secondary | ICD-10-CM | POA: Diagnosis not present

## 2019-04-11 DIAGNOSIS — R2689 Other abnormalities of gait and mobility: Secondary | ICD-10-CM | POA: Diagnosis not present

## 2019-04-11 DIAGNOSIS — M6281 Muscle weakness (generalized): Secondary | ICD-10-CM | POA: Diagnosis not present

## 2019-04-11 DIAGNOSIS — N926 Irregular menstruation, unspecified: Secondary | ICD-10-CM | POA: Diagnosis not present

## 2019-04-12 ENCOUNTER — Other Ambulatory Visit: Payer: Self-pay | Admitting: Cardiology

## 2019-04-12 NOTE — Telephone Encounter (Signed)
From patient.

## 2019-04-12 NOTE — Progress Notes (Signed)
Error

## 2019-04-15 ENCOUNTER — Other Ambulatory Visit: Payer: Self-pay

## 2019-04-15 ENCOUNTER — Ambulatory Visit (INDEPENDENT_AMBULATORY_CARE_PROVIDER_SITE_OTHER): Payer: BC Managed Care – PPO

## 2019-04-15 DIAGNOSIS — I10 Essential (primary) hypertension: Secondary | ICD-10-CM | POA: Diagnosis not present

## 2019-04-16 DIAGNOSIS — H34832 Tributary (branch) retinal vein occlusion, left eye, with macular edema: Secondary | ICD-10-CM | POA: Diagnosis not present

## 2019-04-18 DIAGNOSIS — R2689 Other abnormalities of gait and mobility: Secondary | ICD-10-CM | POA: Diagnosis not present

## 2019-04-18 DIAGNOSIS — M6281 Muscle weakness (generalized): Secondary | ICD-10-CM | POA: Diagnosis not present

## 2019-04-18 DIAGNOSIS — M79602 Pain in left arm: Secondary | ICD-10-CM | POA: Diagnosis not present

## 2019-04-18 DIAGNOSIS — R262 Difficulty in walking, not elsewhere classified: Secondary | ICD-10-CM | POA: Diagnosis not present

## 2019-04-21 DIAGNOSIS — E1065 Type 1 diabetes mellitus with hyperglycemia: Secondary | ICD-10-CM | POA: Diagnosis not present

## 2019-04-23 ENCOUNTER — Encounter: Payer: Self-pay | Admitting: Cardiology

## 2019-04-23 ENCOUNTER — Other Ambulatory Visit: Payer: Self-pay

## 2019-04-23 ENCOUNTER — Ambulatory Visit: Payer: BC Managed Care – PPO | Admitting: Cardiology

## 2019-04-23 VITALS — BP 159/109 | HR 102 | Temp 97.2°F | Ht <= 58 in | Wt 121.0 lb

## 2019-04-23 DIAGNOSIS — R000 Tachycardia, unspecified: Secondary | ICD-10-CM

## 2019-04-23 DIAGNOSIS — M6281 Muscle weakness (generalized): Secondary | ICD-10-CM | POA: Diagnosis not present

## 2019-04-23 DIAGNOSIS — E781 Pure hyperglyceridemia: Secondary | ICD-10-CM

## 2019-04-23 DIAGNOSIS — I10 Essential (primary) hypertension: Secondary | ICD-10-CM | POA: Diagnosis not present

## 2019-04-23 DIAGNOSIS — R2689 Other abnormalities of gait and mobility: Secondary | ICD-10-CM | POA: Diagnosis not present

## 2019-04-23 DIAGNOSIS — R262 Difficulty in walking, not elsewhere classified: Secondary | ICD-10-CM | POA: Diagnosis not present

## 2019-04-23 DIAGNOSIS — M79602 Pain in left arm: Secondary | ICD-10-CM | POA: Diagnosis not present

## 2019-04-23 DIAGNOSIS — I429 Cardiomyopathy, unspecified: Secondary | ICD-10-CM | POA: Diagnosis not present

## 2019-04-23 DIAGNOSIS — E786 Lipoprotein deficiency: Secondary | ICD-10-CM

## 2019-04-23 MED ORDER — IVABRADINE HCL 5 MG PO TABS: 5.0000 mg | ORAL_TABLET | Freq: Two times a day (BID) | ORAL | 1 refills | Status: DC

## 2019-04-23 NOTE — Progress Notes (Signed)
Primary Physician:  Sue Lush, PA-C   Patient ID: Jennifer [name], female    DOB: 22-Dec-1990, 29 y.o.   MRN: [id number]  Subjective:    Chief Complaint  Patient presents with  . Hypertension  . Tachycardia  . Follow-up    2 week    HPI: Jennifer [name]  is a 29 y.o. 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  Patient was last seen 2 weeks ago for acute visit for tachycardia and hypertension. She underwent echocardiogram and now presents for results.   During the interim, she had sent me a message as she felt tired and dizzy with 50 mg of Metoprolol. Metoprolol was reduced to 25 mg and she is tolerating the better. States that she feels better. Herat rate has lowered to around 98 bpm. BP around 120/80. She has not noticed the palpitations and dyspnea has improved.   She continues to work with Dr. Chalmers [name] for management of Diabetes. Reports that her blood sugar has been uncontrolled recently. [name] careful with her diet. [name] very aware of her medical problems. Triglycerides have been as high as 15,000 in 2018. Last triglyceride level in April 2019 was 1471. Mother has high triglycerides in the 700-800 range and also has uncontrolled diabetes. [name] currently on Fenofibrate. States that with statins in the past ended up with frequent pancreatitis. Has previously been on Niacin and Vascepa; however, apparently did not respond to Niacin and has intolerance to Vascepa as she developed rash.   She has residual right ptosis and left hemiparesis from her astrocytoma.  Patient is a Special Ed. Teacher. She remains active and does exercise regularly that she tolerates well. No tobacco, alcohol, or drug use.   Past Medical History:  Diagnosis Date  . Astrocytoma ([place])   . Brain tumor ([place]) 03/29/1995  . Cholesterosis   .  Diabetes mellitus without complication ([place])   . DM (diabetes mellitus) ([place]) 10/10/2018  . Fatty liver   . HTN (hypertension) 10/10/2018  . Hypertension   . Hypothyroidism 10/10/2018  . Hypothyroidism   . Lipoprotein deficiency   . Lung disease    longevity long term  . Pancreatitis   . Polycystic ovary syndrome   . Thyroid disease     Past Surgical History:  Procedure Laterality Date  . ABDOMINAL SURGERY     pt states during miscarriage got her intestine  . BRAIN SURGERY    . EYE MUSCLE SURGERY Right 03/28/2014  . VENTRICULOSTOMY  03/28/1997    Social History   Socioeconomic History  . Marital status: Married    Spouse name: Not on file  . Number of children: 0  . Years of education: Not on file  . Highest education level: Not on file  Occupational History  . Not on file  Tobacco Use  . Smoking status: Never Smoker  . Smokeless tobacco: Never Used  Substance and Sexual Activity  . Alcohol use: Never  . Drug use: Never  . Sexual activity: Not on file  Other Topics Concern  . Not on file  Social History Narrative  . Not on file   Social Determinants of Health   Financial Resource Strain:   . Difficulty of Paying Living Expenses: Not on file  Food Insecurity:   . Worried About Charity fundraiser in the Last Year: Not on file  . Ran Out of Food  in the Last Year: Not on file  Transportation Needs:   . Lack of Transportation (Medical): Not on file  . Lack of Transportation (Non-Medical): Not on file  Physical Activity:   . Days of Exercise per Week: Not on file  . Minutes of Exercise per Session: Not on file  Stress:   . Feeling of Stress : Not on file  Social Connections:   . Frequency of Communication with Friends and Family: Not on file  . Frequency of Social Gatherings with Friends and Family: Not on file  . Attends Religious Services: Not on file  . Active Member of Clubs or Organizations: Not on file  . Attends Archivist Meetings: Not on  file  . Marital Status: Not on file  Intimate Partner Violence:   . Fear of Current or Ex-Partner: Not on file  . Emotionally Abused: Not on file  . Physically Abused: Not on file  . Sexually Abused: Not on file    Review of Systems  Constitution: Negative for decreased appetite, malaise/fatigue, weight gain and weight loss.  Eyes: Negative for visual disturbance.  Cardiovascular: Positive for chest pain (improved with being lyrica). Negative for claudication, dyspnea on exertion, leg swelling, orthopnea, palpitations and syncope.  Respiratory: Positive for shortness of breath (improved). Negative for hemoptysis and wheezing.   Endocrine: Negative for cold intolerance and heat intolerance.  Hematologic/Lymphatic: Does not bruise/bleed easily.  Skin: Negative for nail changes.  Musculoskeletal: Negative for muscle weakness and myalgias.  Gastrointestinal: Negative for abdominal pain, change in bowel habit, nausea and vomiting.  Neurological: Positive for numbness (right and left hand) and paresthesias. Negative for difficulty with concentration, dizziness, focal weakness and headaches.  Psychiatric/Behavioral: Negative for altered mental status and suicidal ideas.  All other systems reviewed and are negative.     Objective:  Blood pressure (!) 159/109, pulse (!) 102, temperature (!) 97.2 F (36.2 C), height 4' 9"  (1.448 m), weight 121 lb (54.9 kg), SpO2 100 %. Body mass index is 26.18 kg/m.   Physical Exam  Constitutional: [name] oriented to person, place, and time. Vital signs are normal. She appears well-developed and well-nourished.  HENT:  Head: Normocephalic and atraumatic.  Cardiovascular: Regular rhythm and intact distal pulses. Tachycardia present. Exam reveals gallop and S4.  Pulmonary/Chest: Effort normal and breath sounds normal. No accessory muscle usage. No respiratory distress.  Abdominal: Soft. Bowel sounds are normal.  Musculoskeletal:        General: Normal range  of motion.     Cervical back: Normal range of motion.  Neurological: [name] alert and oriented to person, place, and time.  Skin: Skin is warm and dry.  Vitals reviewed.  Radiology: No results found.  Laboratory examination:    CMP Latest Ref Rng & Units 08/05/2018 08/04/2018 07/28/2018  Glucose 70 - 99 mg/dL - 327(H) 339(H)  BUN 6 - 20 mg/dL - 20 22(H)  Creatinine 0.44 - 1.00 mg/dL - 0.61 0.64  Sodium 135 - 145 mmol/L 135 127(L) 134(L)  Potassium 3.5 - 5.1 mmol/L 3.3(L) 3.8 4.5  Chloride 98 - 111 mmol/L - 89(L) 99  CO2 22 - 32 mmol/L - 22 20(L)  Calcium 8.9 - 10.3 mg/dL - 9.9 9.4  Total Protein 6.5 - 8.1 g/dL - - 7.1  Total Bilirubin 0.3 - 1.2 mg/dL - - 0.5  Alkaline Phos 38 - 126 U/L - - 68  AST 15 - 41 U/L - - 37  ALT 0 - 44 U/L - - 46(H)  CBC Latest Ref Rng & Units 08/05/2018 08/04/2018 07/28/2018  WBC 4.0 - 10.5 K/uL - 10.9(H) 9.1  Hemoglobin 12.0 - 15.0 g/dL 8.8(L) 11.9(L) 10.7(L)  Hematocrit 36.0 - 46.0 % 26.0(L) 32.0(L) 30.9(L)  Platelets 150 - 400 K/uL - 247 216   Lipid Panel  No results found for: CHOL, TRIG, HDL, CHOLHDL, VLDL, LDLCALC, LDLDIRECT HEMOGLOBIN A1C No results found for: HGBA1C, MPG TSH No results for input(s): TSH in the last 8760 hours.  PRN Meds:. Medications Discontinued During This Encounter  Medication Reason  . insulin lispro (HUMALOG) 100 UNIT/ML injection Error   Current Meds  Medication Sig  . Acetylcarnitine HCl (ACETYL L-CARNITINE) 500 MG CAPS Take 500 mg by mouth daily.  Marland Kitchen albuterol (VENTOLIN HFA) 108 (90 Base) MCG/ACT inhaler Inhale into the lungs as needed.  . ALPHA LIPOIC ACID-BIOTIN PO Take 200 mg by mouth daily.   . baclofen (LIORESAL) 10 MG tablet Take by mouth daily.  Marland Kitchen CRANBERRY FRUIT PO Take 1 capsule by mouth 2 (two) times a day.  . Cyanocobalamin (B-12) 3000 MCG CAPS Take 1 capsule by mouth daily.  . cyclobenzaprine (FLEXERIL) 10 MG tablet Take 10 mg by mouth as needed. for muscle spams  . dicyclomine (BENTYL) 10 MG capsule  Take by mouth 3 (three) times daily as needed.  . diltiazem (CARDIZEM SR) 120 MG 12 hr capsule daily.  . DULoxetine (CYMBALTA) 60 MG capsule Take by mouth daily.  . famotidine (PEPCID) 20 MG tablet Take by mouth 2 (two) times daily.  . fenofibrate (TRICOR) 145 MG tablet Take 145 mg by mouth daily.  . fluconazole (DIFLUCAN) 150 MG tablet Take by mouth.  . fluticasone furoate-vilanterol (BREO ELLIPTA) 100-25 MCG/INH AEPB Inhale into the lungs as needed.  Marland Kitchen ibuprofen (ADVIL) 600 MG tablet Take 1 tablet (600 mg total) by mouth every 6 (six) hours as needed.  . Insulin Glargine, 1 Unit Dial, (TOUJEO SOLOSTAR) 300 UNIT/ML SOPN Inject 80 Units into the skin at bedtime. 50 in the morning and 80 at bedtime  . levothyroxine (SYNTHROID) 150 MCG tablet Take 150 mcg by mouth daily.  . metFORMIN (GLUCOPHAGE) 500 MG tablet daily.  . metoprolol tartrate (LOPRESSOR) 50 MG tablet Take 1 tablet (50 mg total) by mouth 2 (two) times daily. (Patient taking differently: Take 25 mg by mouth 2 (two) times daily. )  . NOVOLOG FLEXPEN 100 UNIT/ML FlexPen Inject 25 Units into the skin 2 (two) times daily with a meal. Every meal  . ondansetron (ZOFRAN) 4 MG tablet as needed.  . pregabalin (LYRICA) 300 MG capsule Take by mouth 2 (two) times daily.   Marland Kitchen spironolactone (ALDACTONE) 25 MG tablet Take by mouth daily.    Cardiac Studies:   Echocardiogram 10/04/2018: Mildly depressed LV systolic function with visual EF 40-45%. Left ventricle cavity is normal in size. Mild global hypokinesis. Doppler evidence of grade I (impaired) diastolic dysfunction, normal LAP.  Trileaflet aortic valve. Moderate (Grade II) aortic regurgitation. Mild to moderate mitral regurgitation. Mild tricuspid regurgitation. Estimated pulmonary artery systolic pressure is 31  mmHg.   Cardiac MRI  08/04/2017:  Result Impression  1. The left ventricle is normal in size and has moderately decreased function. There is mid to basal inferoseptal hypokinesis.  The right ventricle is normal in size and has mildly decreased function. 2. There is evidence of subepicardial enhancement of the mid inferoseptal wall segment, which is compatible with myocarditis. There is also a very small focal subendocardial enhancement in the mid inferoseptum which is compatible with  infarction.  3. There is evidence of myocardial edema of the mid epicardial inferoseptum which is compatible with acute myocarditis. 4. Small circumferential pericardial effusion   Assessment:     ICD-10-CM   1. Sinus tachycardia  R00.0   2. Essential hypertension  I10   3. Cardiomyopathy, unspecified type ([place])  I42.9   4. Hypertriglyceridemia  E78.1   5. Lipoprotein lipase deficiency, familial  E78.6      EKG 04/08/2019: Sinus tachycardia at 117 bpm with short PR interval, normal axis, nonspecific ST abnormality. No significant changes compared to EKG 0603/2020  Recommendations:   Ms. Jennifer [name] is a 29 year old Caucasian female recently evaluated by [place] for hypertriglyceridemia. Insulin resistance related lipodystrophy and is also contributing to her hypertriglycerdemia. She was diagnosed with IDDM at age 65, triglyceride evaluation occurred around age 67 years.  I have discussed her recent echocardiogram. She continues to have depressed LVEF at 40%. Symptomatically she is feeling some better with being on Metoprolol. Tachycardia has improved, but still elevated. She will re-challenge Metoprolol 50 mg BID. I would potentially like to get her off of diltiazem in view of her cardiomyopathy; however, it is imperative that we get her heart rate under control. Will add Corlanor 5 mg BID. She will first start 2.5 BID for the next few days and then further increase to 5 mg BID if she is tolerating well. Liver enzymes are not normal by last labs in August 2020, but not markedly elevated. She will need ischemic evaluation; however, heart rate will need to be stable first. No clinical evidence  of decompensated heart failure. She will potentially benefit from [facility] therapy, but will hold off for now until we can get her stabilized.   She has been referred to [facility] for evaluation of her hypertriglycerdemia and lipodystrophy and is awaiting the appointment. She may benefit from participation in ongoing clinical trials. Will certainly keep her Endocrinologist, Dr. Chalmers [name], in the loop. I will see her back in the next 1-2 weeks for close follow up on her tachycardia and hypertension.   CC: Dr. Cephus Shelling, MSN, APRN, [facility]. [place] Office: [phone] Fax: [phone]

## 2019-04-25 NOTE — Telephone Encounter (Signed)
Forwarded message

## 2019-04-27 MED ORDER — SPIRONOLACTONE 25 MG PO TABS: 25.0000 mg | ORAL_TABLET | Freq: Every day | ORAL | 3 refills | Status: DC

## 2019-04-29 ENCOUNTER — Encounter: Payer: Self-pay | Admitting: Cardiology

## 2019-04-29 ENCOUNTER — Ambulatory Visit (INDEPENDENT_AMBULATORY_CARE_PROVIDER_SITE_OTHER): Payer: BC Managed Care – PPO | Admitting: Cardiology

## 2019-04-29 ENCOUNTER — Other Ambulatory Visit: Payer: Self-pay

## 2019-04-29 VITALS — BP 112/78 | HR 82 | Temp 98.2°F | Ht <= 58 in | Wt 121.0 lb

## 2019-04-29 DIAGNOSIS — I5022 Chronic systolic (congestive) heart failure: Secondary | ICD-10-CM

## 2019-04-29 DIAGNOSIS — I429 Cardiomyopathy, unspecified: Secondary | ICD-10-CM | POA: Diagnosis not present

## 2019-04-29 DIAGNOSIS — I10 Essential (primary) hypertension: Secondary | ICD-10-CM | POA: Diagnosis not present

## 2019-04-29 DIAGNOSIS — R000 Tachycardia, unspecified: Secondary | ICD-10-CM | POA: Diagnosis not present

## 2019-04-29 MED ORDER — ENTRESTO 24-26 MG PO TABS: 1.0000 | ORAL_TABLET | Freq: Two times a day (BID) | ORAL | 1 refills | Status: DC

## 2019-04-29 NOTE — Progress Notes (Signed)
Primary Physician:  Sue Lush, PA-C   Patient ID: Jennifer [name], female    DOB: [birth year], 29 y.o.   MRN: [id number]  Subjective:    Chief Complaint  Patient presents with  . Tachycardia  . Cardiomyopathy  . Follow-up    1 week    HPI: Jennifer [name]  is a 29 y.o. 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  Patient was last seen 1 week ago. Echocardiogram om 01/18 showed depressed LVEF at 40%. Due to continued tachycardia, she was started on Corlanor at her last visit in addition to her Metoprolol and Diltiazem. During the interim, she developed dizziness; therefore, diltiazem was discontinued. She states that her heart rate has now been as low as in the 60's and she is feeling much better. She still has some abdominal swelling particularly at the end of the day.   She continues to work with Dr. Chalmers Cater for management of Diabetes. She has upcoming appt with [facility] in March. [name] careful with her diet. [name] very aware of her medical problems. Triglycerides have been as high as 15,000 in 2018. Last triglyceride level in April 2019 was 1471. Mother has high triglycerides in the 700-800 range and also has uncontrolled diabetes. [name] currently on Fenofibrate. States that with statins in the past ended up with frequent pancreatitis. Has previously been on Niacin and Vascepa; however, apparently did not respond to Niacin and has intolerance to Vascepa as she developed rash.   She has residual right ptosis and left hemiparesis from her astrocytoma.  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   Past Medical History:  Diagnosis Date  . Astrocytoma ([place])   . Brain tumor ([place]) 03/29/1995  . Cholesterosis   . Diabetes mellitus without complication ([place])   . DM (diabetes mellitus) ([place]) 10/10/2018  .  Fatty liver   . HTN (hypertension) 10/10/2018  . Hypertension   . Hypothyroidism 10/10/2018  . Hypothyroidism   . Lipoprotein deficiency   . Lung disease    longevity long term  . Pancreatitis   . Polycystic ovary syndrome   . Thyroid disease     Past Surgical History:  Procedure Laterality Date  . ABDOMINAL SURGERY     pt states during miscarriage got her intestine  . BRAIN SURGERY    . EYE MUSCLE SURGERY Right 03/28/2014  . VENTRICULOSTOMY  03/28/1997    Social History   Socioeconomic History  . Marital status: Married    Spouse name: Not on file  . Number of children: 0  . Years of education: Not on file  . Highest education level: Not on file  Occupational History  . Not on file  Tobacco Use  . Smoking status: Never Smoker  . Smokeless tobacco: Never Used  Substance and Sexual Activity  . Alcohol use: Never  . Drug use: Never  . Sexual activity: Not on file  Other Topics Concern  . Not on file  Social History Narrative  . Not on file   Social Determinants of Health   Financial Resource Strain:   . Difficulty of Paying Living Expenses: Not on file  Food Insecurity:   . Worried About Charity fundraiser in the Last Year: Not on file  . Ran Out of Food in the Last Year: Not on file  Transportation Needs:   . Lack of  Transportation (Medical): Not on file  . Lack of Transportation (Non-Medical): Not on file  Physical Activity:   . Days of Exercise per Week: Not on file  . Minutes of Exercise per Session: Not on file  Stress:   . Feeling of Stress : Not on file  Social Connections:   . Frequency of Communication with Friends and Family: Not on file  . Frequency of Social Gatherings with Friends and Family: Not on file  . Attends Religious Services: Not on file  . Active Member of Clubs or Organizations: Not on file  . Attends Archivist Meetings: Not on file  . Marital Status: Not on file  Intimate Partner Violence:   . Fear of Current or  Ex-Partner: Not on file  . Emotionally Abused: Not on file  . Physically Abused: Not on file  . Sexually Abused: Not on file    Review of Systems  Constitution: Negative for decreased appetite, malaise/fatigue, weight gain and weight loss.  Eyes: Negative for visual disturbance.  Cardiovascular: Positive for chest pain (improved with being lyrica). Negative for claudication, dyspnea on exertion, leg swelling, orthopnea, palpitations and syncope.  Respiratory: Positive for shortness of breath (improved). Negative for hemoptysis and wheezing.   Endocrine: Negative for cold intolerance and heat intolerance.  Hematologic/Lymphatic: Does not bruise/bleed easily.  Skin: Negative for nail changes.  Musculoskeletal: Negative for muscle weakness and myalgias.  Gastrointestinal: Negative for abdominal pain, change in bowel habit, nausea and vomiting.  Neurological: Positive for numbness (right and left hand) and paresthesias. Negative for difficulty with concentration, dizziness, focal weakness and headaches.  Psychiatric/Behavioral: Negative for altered mental status and suicidal ideas.  All other systems reviewed and are negative.     Objective:  Blood pressure 112/78, pulse 82, temperature 98.2 F (36.8 C), height 4' 9"  (1.448 m), weight 121 lb (54.9 kg), SpO2 100 %. Body mass index is 26.18 kg/m.   Physical Exam  Constitutional: [name] oriented to person, place, and time. Vital signs are normal. She appears well-developed and well-nourished.  HENT:  Head: Normocephalic and atraumatic.  Cardiovascular: Normal rate, regular rhythm and intact distal pulses.  Pulmonary/Chest: Effort normal and breath sounds normal. No accessory muscle usage. No respiratory distress.  Abdominal: Soft. Bowel sounds are normal.  Musculoskeletal:        General: Normal range of motion.     Cervical back: Normal range of motion.  Neurological: [name] alert and oriented to person, place, and time.  Skin: Skin  is warm and dry.  Vitals reviewed.  Radiology: No results found.  Laboratory examination:    CMP Latest Ref Rng & Units 08/05/2018 08/04/2018 07/28/2018  Glucose 70 - 99 mg/dL - 327(H) 339(H)  BUN 6 - 20 mg/dL - 20 22(H)  Creatinine 0.44 - 1.00 mg/dL - 0.61 0.64  Sodium 135 - 145 mmol/L 135 127(L) 134(L)  Potassium 3.5 - 5.1 mmol/L 3.3(L) 3.8 4.5  Chloride 98 - 111 mmol/L - 89(L) 99  CO2 22 - 32 mmol/L - 22 20(L)  Calcium 8.9 - 10.3 mg/dL - 9.9 9.4  Total Protein 6.5 - 8.1 g/dL - - 7.1  Total Bilirubin 0.3 - 1.2 mg/dL - - 0.5  Alkaline Phos 38 - 126 U/L - - 68  AST 15 - 41 U/L - - 37  ALT 0 - 44 U/L - - 46(H)   CBC Latest Ref Rng & Units 08/05/2018 08/04/2018 07/28/2018  WBC 4.0 - 10.5 K/uL - 10.9(H) 9.1  Hemoglobin 12.0 -  15.0 g/dL 8.8(L) 11.9(L) 10.7(L)  Hematocrit 36.0 - 46.0 % 26.0(L) 32.0(L) 30.9(L)  Platelets 150 - 400 K/uL - 247 216   Lipid Panel  No results found for: CHOL, TRIG, HDL, CHOLHDL, VLDL, LDLCALC, LDLDIRECT HEMOGLOBIN A1C No results found for: HGBA1C, MPG TSH No results for input(s): TSH in the last 8760 hours.  PRN Meds:. Medications Discontinued During This Encounter  Medication Reason  . diltiazem (CARDIZEM SR) 120 MG 12 hr capsule Error   Current Meds  Medication Sig  . Acetylcarnitine HCl (ACETYL L-CARNITINE) 500 MG CAPS Take 500 mg by mouth daily.  Marland Kitchen albuterol (VENTOLIN HFA) 108 (90 Base) MCG/ACT inhaler Inhale into the lungs as needed.  . ALPHA LIPOIC ACID-BIOTIN PO Take 200 mg by mouth daily.   . baclofen (LIORESAL) 10 MG tablet Take by mouth daily.  Marland Kitchen CRANBERRY FRUIT PO Take 1 capsule by mouth 2 (two) times a day.  . Cyanocobalamin (B-12) 3000 MCG CAPS Take 1 capsule by mouth daily.  . cyclobenzaprine (FLEXERIL) 10 MG tablet Take 10 mg by mouth as needed. for muscle spams  . dicyclomine (BENTYL) 10 MG capsule Take by mouth 3 (three) times daily as needed.  . DULoxetine (CYMBALTA) 60 MG capsule Take by mouth daily.  . famotidine (PEPCID) 20 MG  tablet Take by mouth 2 (two) times daily.  . fenofibrate (TRICOR) 145 MG tablet Take 145 mg by mouth daily.  . fluticasone furoate-vilanterol (BREO ELLIPTA) 100-25 MCG/INH AEPB Inhale into the lungs as needed.  Marland Kitchen ibuprofen (ADVIL) 600 MG tablet Take 1 tablet (600 mg total) by mouth every 6 (six) hours as needed.  . Insulin Glargine, 1 Unit Dial, (TOUJEO SOLOSTAR) 300 UNIT/ML SOPN Inject 80 Units into the skin at bedtime. 50 in the morning and 80 at bedtime  . ivabradine (CORLANOR) 5 MG TABS tablet Take 1 tablet (5 mg total) by mouth 2 (two) times daily with a meal.  . levothyroxine (SYNTHROID) 150 MCG tablet Take 150 mcg by mouth daily.  . metFORMIN (GLUCOPHAGE) 500 MG tablet daily.  . metoprolol tartrate (LOPRESSOR) 50 MG tablet Take 1 tablet (50 mg total) by mouth 2 (two) times daily.  Marland Kitchen NOVOLOG FLEXPEN 100 UNIT/ML FlexPen Inject 25 Units into the skin 2 (two) times daily with a meal. Every meal  . ondansetron (ZOFRAN) 4 MG tablet as needed.  . pregabalin (LYRICA) 300 MG capsule Take by mouth 2 (two) times daily.   Marland Kitchen spironolactone (ALDACTONE) 25 MG tablet Take 1 tablet (25 mg total) by mouth daily.    Cardiac Studies:   Echocardiogram 04/16/2019:  Left ventricle cavity is normal in size. Mild concentric hypertrophy of  the left ventricle. Normal global wall motion. Moderately depressed LV  systolic function with EF 40%. Doppler evidence of grade I (impaired)  diastolic dysfunction, normal LAP. Calculated EF 40%.  Trileaflet aortic valve. Mild to moderate aortic regurgitation.  Moderate (Grade III) mitral regurgitation.  Moderate tricuspid regurgitation. Estimated pulmonary artery systolic  pressure is 30 mmHg.  Mild pulmonic regurgitation.  No significant change compared to previous study on 10/03/2018.  Cardiac MRI  08/04/2017:  Result Impression  1. The left ventricle is normal in size and has moderately decreased function. There is mid to basal inferoseptal hypokinesis. The right  ventricle is normal in size and has mildly decreased function. 2. There is evidence of subepicardial enhancement of the mid inferoseptal wall segment, which is compatible with myocarditis. There is also a very small focal subendocardial enhancement in the mid inferoseptum which  is compatible with infarction.  3. There is evidence of myocardial edema of the mid epicardial inferoseptum which is compatible with acute myocarditis. 4. Small circumferential pericardial effusion   Assessment:     ICD-10-CM   1. Cardiomyopathy, unspecified type ([place])  D47.1 Basic metabolic panel    PCV MYOCARDIAL PERFUSION WITH LEXISCAN    EKG 12-Lead  2. Essential hypertension  I10   3. Sinus tachycardia  R00.0   4. Chronic systolic (congestive) heart failure (HCC)  I50.22     EKG 04/29/2019: Normal sinus rhythm at 84 bpm, normal axis, no evidence of ischemia.  Recommendations:   Ms. Brandie Lopes is a 29 year old Caucasian female recently evaluated by [place] for hypertriglyceridemia. Insulin resistance related lipodystrophy and is also contributing to her hypertriglycerdemia. She was diagnosed with IDDM at age 65, triglyceride evaluation occurred around age 12 years.  Tachycardia has significantly improved with Corlanor and Metoprolol. [name] feeling much better, has class 2 symptoms. Heart rate has been in the 60's at home; therefore, will continue with 5 mg of Corlanor. Will need to monitor her liver enzymes. Blood pressure is also much better controlled and actually borderline soft. Will not further increase her Metoprolol in view of this and also that she will benefit from [name] therapy. Will start her on Entresto 24-26 mg BID. Check BMP in 10 days for follow up on kidney function. Will continue to hold diltiazem in view of her CHF. Hopefully with addition of Entresto, her symptoms will continue to improve. She will need ischemic evaluation given her cardiomyopathy and now that her heart rate has improved,  will arrange for lexiscan nuclear stress test. Cannot walk on the treadmill due to her hemiparesis.  She has been referred to [facility] for evaluation of her hypertriglycerdemia and lipodystrophy and is scheduled for consultation next month. She may benefit from participation in ongoing clinical trials. Will keep Dr. Chalmers Cater informed. Will see her back in 2 weeks for follow up since starting Entresto and possible uptitration.   CC: Dr. Cephus Shelling, MSN, APRN, [facility]. [place] Office: [phone] Fax: [phone]

## 2019-04-30 ENCOUNTER — Ambulatory Visit: Payer: BC Managed Care – PPO | Admitting: Cardiology

## 2019-04-30 ENCOUNTER — Encounter: Payer: Self-pay | Admitting: Cardiology

## 2019-04-30 DIAGNOSIS — R2689 Other abnormalities of gait and mobility: Secondary | ICD-10-CM | POA: Diagnosis not present

## 2019-04-30 DIAGNOSIS — R262 Difficulty in walking, not elsewhere classified: Secondary | ICD-10-CM | POA: Diagnosis not present

## 2019-04-30 DIAGNOSIS — M6281 Muscle weakness (generalized): Secondary | ICD-10-CM | POA: Diagnosis not present

## 2019-04-30 DIAGNOSIS — M79602 Pain in left arm: Secondary | ICD-10-CM | POA: Diagnosis not present

## 2019-05-02 DIAGNOSIS — H35033 Hypertensive retinopathy, bilateral: Secondary | ICD-10-CM | POA: Diagnosis not present

## 2019-05-02 DIAGNOSIS — H3581 Retinal edema: Secondary | ICD-10-CM | POA: Diagnosis not present

## 2019-05-02 DIAGNOSIS — R262 Difficulty in walking, not elsewhere classified: Secondary | ICD-10-CM | POA: Diagnosis not present

## 2019-05-02 DIAGNOSIS — H35022 Exudative retinopathy, left eye: Secondary | ICD-10-CM | POA: Diagnosis not present

## 2019-05-02 DIAGNOSIS — M79602 Pain in left arm: Secondary | ICD-10-CM | POA: Diagnosis not present

## 2019-05-02 DIAGNOSIS — H34832 Tributary (branch) retinal vein occlusion, left eye, with macular edema: Secondary | ICD-10-CM | POA: Diagnosis not present

## 2019-05-02 DIAGNOSIS — M6281 Muscle weakness (generalized): Secondary | ICD-10-CM | POA: Diagnosis not present

## 2019-05-02 DIAGNOSIS — R2689 Other abnormalities of gait and mobility: Secondary | ICD-10-CM | POA: Diagnosis not present

## 2019-05-02 DIAGNOSIS — H3582 Retinal ischemia: Secondary | ICD-10-CM | POA: Diagnosis not present

## 2019-05-06 DIAGNOSIS — H34811 Central retinal vein occlusion, right eye, with macular edema: Secondary | ICD-10-CM | POA: Diagnosis not present

## 2019-05-07 DIAGNOSIS — M6281 Muscle weakness (generalized): Secondary | ICD-10-CM | POA: Diagnosis not present

## 2019-05-07 DIAGNOSIS — R262 Difficulty in walking, not elsewhere classified: Secondary | ICD-10-CM | POA: Diagnosis not present

## 2019-05-07 DIAGNOSIS — R2689 Other abnormalities of gait and mobility: Secondary | ICD-10-CM | POA: Diagnosis not present

## 2019-05-07 DIAGNOSIS — M79602 Pain in left arm: Secondary | ICD-10-CM | POA: Diagnosis not present

## 2019-05-07 NOTE — Telephone Encounter (Signed)
Please read

## 2019-05-08 ENCOUNTER — Other Ambulatory Visit: Payer: Self-pay

## 2019-05-08 ENCOUNTER — Ambulatory Visit: Payer: BC Managed Care – PPO

## 2019-05-08 DIAGNOSIS — I429 Cardiomyopathy, unspecified: Secondary | ICD-10-CM | POA: Diagnosis not present

## 2019-05-08 NOTE — Telephone Encounter (Signed)
Please review

## 2019-05-09 ENCOUNTER — Other Ambulatory Visit: Payer: Self-pay

## 2019-05-09 ENCOUNTER — Encounter (HOSPITAL_COMMUNITY): Payer: Self-pay | Admitting: Emergency Medicine

## 2019-05-09 ENCOUNTER — Emergency Department (HOSPITAL_COMMUNITY)
Admission: EM | Admit: 2019-05-09 | Discharge: 2019-05-10 | Disposition: A | Discharge status: Home / Self Care | Payer: BC Managed Care – PPO | Attending: Emergency Medicine | Admitting: Emergency Medicine

## 2019-05-09 DIAGNOSIS — R112 Nausea with vomiting, unspecified: Secondary | ICD-10-CM | POA: Diagnosis not present

## 2019-05-09 DIAGNOSIS — E119 Type 2 diabetes mellitus without complications: Secondary | ICD-10-CM | POA: Insufficient documentation

## 2019-05-09 DIAGNOSIS — M6281 Muscle weakness (generalized): Secondary | ICD-10-CM | POA: Diagnosis not present

## 2019-05-09 DIAGNOSIS — D1809 Hemangioma of other sites: Secondary | ICD-10-CM | POA: Diagnosis not present

## 2019-05-09 DIAGNOSIS — Z7984 Long term (current) use of oral hypoglycemic drugs: Secondary | ICD-10-CM | POA: Diagnosis not present

## 2019-05-09 DIAGNOSIS — R1013 Epigastric pain: Secondary | ICD-10-CM | POA: Diagnosis not present

## 2019-05-09 DIAGNOSIS — D1803 Hemangioma of intra-abdominal structures: Secondary | ICD-10-CM | POA: Diagnosis not present

## 2019-05-09 DIAGNOSIS — R1084 Generalized abdominal pain: Secondary | ICD-10-CM | POA: Diagnosis not present

## 2019-05-09 DIAGNOSIS — M79602 Pain in left arm: Secondary | ICD-10-CM | POA: Diagnosis not present

## 2019-05-09 DIAGNOSIS — E039 Hypothyroidism, unspecified: Secondary | ICD-10-CM | POA: Diagnosis not present

## 2019-05-09 DIAGNOSIS — R000 Tachycardia, unspecified: Secondary | ICD-10-CM | POA: Diagnosis not present

## 2019-05-09 DIAGNOSIS — Z79899 Other long term (current) drug therapy: Secondary | ICD-10-CM | POA: Diagnosis not present

## 2019-05-09 DIAGNOSIS — R0902 Hypoxemia: Secondary | ICD-10-CM | POA: Diagnosis not present

## 2019-05-09 DIAGNOSIS — R2689 Other abnormalities of gait and mobility: Secondary | ICD-10-CM | POA: Diagnosis not present

## 2019-05-09 DIAGNOSIS — R52 Pain, unspecified: Secondary | ICD-10-CM | POA: Diagnosis not present

## 2019-05-09 DIAGNOSIS — N83202 Unspecified ovarian cyst, left side: Secondary | ICD-10-CM | POA: Diagnosis not present

## 2019-05-09 DIAGNOSIS — R262 Difficulty in walking, not elsewhere classified: Secondary | ICD-10-CM | POA: Diagnosis not present

## 2019-05-09 DIAGNOSIS — I10 Essential (primary) hypertension: Secondary | ICD-10-CM | POA: Diagnosis not present

## 2019-05-09 LAB — COMPREHENSIVE METABOLIC PANEL
ALT: 35 U/L (ref 0–44)
AST: 35 U/L (ref 15–41)
Albumin: 4.6 g/dL (ref 3.5–5.0)
Alkaline Phosphatase: 49 U/L (ref 38–126)
Anion gap: 16 — ABNORMAL HIGH (ref 5–15)
BUN: 35 mg/dL — ABNORMAL HIGH (ref 6–20)
CO2: 19 mmol/L — ABNORMAL LOW (ref 22–32)
Calcium: 10.2 mg/dL (ref 8.9–10.3)
Chloride: 98 mmol/L (ref 98–111)
Creatinine, Ser: 1.1 mg/dL — ABNORMAL HIGH (ref 0.44–1.00)
GFR calc Af Amer: >60 mL/min (ref >60)
GFR calc non Af Amer: >60 mL/min (ref >60)
Glucose, Bld: 132 mg/dL — ABNORMAL HIGH (ref 70–99)
Potassium: 4.1 mmol/L (ref 3.5–5.1)
Sodium: 133 mmol/L — ABNORMAL LOW (ref 135–145)
Total Bilirubin: 0.9 mg/dL (ref 0.3–1.2)
Total Protein: 7.7 g/dL (ref 6.5–8.1)

## 2019-05-09 LAB — CBC WITH DIFFERENTIAL/PLATELET
Abs Immature Granulocytes: 0.02 10*3/uL (ref 0.00–0.07)
Basophils Absolute: 0.1 10*3/uL (ref 0.0–0.1)
Basophils Relative: 1 %
Eosinophils Absolute: 0.1 10*3/uL (ref 0.0–0.5)
Eosinophils Relative: 1 %
HCT: 41 % (ref 36.0–46.0)
Hemoglobin: 13.8 g/dL (ref 12.0–15.0)
Immature Granulocytes: 0 %
Lymphocytes Relative: 37 %
Lymphs Abs: 2.6 10*3/uL (ref 0.7–4.0)
MCH: 27.9 pg (ref 26.0–34.0)
MCHC: 33.7 g/dL (ref 30.0–36.0)
MCV: 82.8 fL (ref 80.0–100.0)
Monocytes Absolute: 0.6 10*3/uL (ref 0.1–1.0)
Monocytes Relative: 9 %
Neutro Abs: 3.6 10*3/uL (ref 1.7–7.7)
Neutrophils Relative %: 52 %
Platelets: 150 10*3/uL (ref 150–400)
RBC: 4.95 MIL/uL (ref 3.87–5.11)
RDW: 14.4 % (ref 11.5–15.5)
WBC: 7 10*3/uL (ref 4.0–10.5)
nRBC: 0 % (ref 0.0–0.2)

## 2019-05-09 LAB — HCG, QUANTITATIVE, PREGNANCY: hCG, Beta Chain, Quant, S: 1 m[IU]/mL (ref <5)

## 2019-05-09 LAB — CBG MONITORING, ED: Glucose-Capillary: 101 mg/dL — ABNORMAL HIGH (ref 70–99)

## 2019-05-09 LAB — LIPASE, BLOOD: Lipase: 34 U/L (ref 11–51)

## 2019-05-09 MED ORDER — ONDANSETRON HCL 4 MG/2ML IJ SOLN
4.0000 mg | Freq: Once | INTRAMUSCULAR | Status: AC
  Administered 2019-05-09: 4 mg via INTRAVENOUS
  Filled 2019-05-09: qty 2

## 2019-05-09 MED ORDER — HYDROMORPHONE HCL 1 MG/ML IJ SOLN
1.0000 mg | Freq: Once | INTRAMUSCULAR | Status: AC
  Administered 2019-05-09: 1 mg via INTRAVENOUS
  Filled 2019-05-09: qty 1

## 2019-05-09 MED ORDER — METOPROLOL TARTRATE 25 MG PO TABS
50.0000 mg | ORAL_TABLET | Freq: Once | ORAL | Status: AC
  Administered 2019-05-09: 50 mg via ORAL
  Filled 2019-05-09: qty 2

## 2019-05-09 MED ORDER — SODIUM CHLORIDE 0.9 % IV BOLUS
1000.0000 mL | Freq: Once | INTRAVENOUS | Status: AC
  Administered 2019-05-09: 21:00:00 1000 mL via INTRAVENOUS

## 2019-05-09 MED ORDER — FENTANYL CITRATE (PF) 100 MCG/2ML IJ SOLN
50.0000 ug | Freq: Once | INTRAMUSCULAR | Status: AC
  Administered 2019-05-09: 21:00:00 50 ug via INTRAVENOUS
  Filled 2019-05-09: qty 2

## 2019-05-09 NOTE — ED Triage Notes (Signed)
Pt BIB [facility] from home, c/o abdominal pain, nausea, vomiting x 1. Hx pancreatitis. EMS VS: BP 146/96, HR 140, SpO2 97% room air, CBG 194

## 2019-05-09 NOTE — ED Provider Notes (Signed)
[facility] EMERGENCY DEPARTMENT Provider Note   CSN: [id number] Arrival date & time: 05/09/19  2022     History Chief Complaint  Patient presents with  . Abdominal Pain    Jennifer [name] is a 29 y.o. female.  Presents to the emerge department chief complaint of abdominal pain.  States she has history of pancreatitis, feels similar to past episodes of pancreatitis.  Has not had that episode for couple years.  Pain started today, has been feeling somewhat off for the past couple days.  Primarily upper abdomen, associated with nausea, one episode of vomiting today, nonbloody nonbilious.  No constipation or diarrhea.  Does not like to take medicine, has not taken any medicine today for her pain.  Pain currently 10 out of 10 in severity.  Did improve with the fentanyl and Zofran but now has returned.  Has not taken her nightly metoprolol dose.  States her normal heart rate is in the 120s.  Past medical history diabetes, astrocytoma, cardiomyopathy, hypothyroidism, fatty liver, PCOS  HPI     Past Medical History:  Diagnosis Date  . Astrocytoma ([place])   . Brain tumor ([place]) 03/29/1995  . Cholesterosis   . Diabetes mellitus without complication ([place])   . DM (diabetes mellitus) ([place]) 10/10/2018  . Fatty liver   . HTN (hypertension) 10/10/2018  . Hypertension   . Hypothyroidism 10/10/2018  . Hypothyroidism   . Lipoprotein deficiency   . Lung disease    longevity long term  . Pancreatitis   . Polycystic ovary syndrome   . Thyroid disease     Patient Active Problem List   Diagnosis Date Noted  . DM (diabetes mellitus) ([place]) 10/10/2018  . Hypothyroidism 10/10/2018    Past Surgical History:  Procedure Laterality Date  . ABDOMINAL SURGERY     pt states during miscarriage got her intestine  . BRAIN SURGERY    . EYE MUSCLE SURGERY Right 03/28/2014  . VENTRICULOSTOMY  03/28/1997     OB History   No obstetric history on file.     Family History  Problem  Relation Age of Onset  . Diabetes Mother   . Hypertension Mother   . Hyperlipidemia Mother   . Thyroid disease Mother   . Hypertension Father   . Diabetes Father     Social History   Tobacco Use  . Smoking status: Never Smoker  . Smokeless tobacco: Never Used  Substance Use Topics  . Alcohol use: Never  . Drug use: Never    Home Medications Prior to Admission medications   Medication Sig Start Date End Date Taking? Authorizing Provider  Acetylcarnitine HCl (ACETYL L-CARNITINE) 500 MG CAPS Take 500 mg by mouth daily.    [provider]  albuterol (VENTOLIN HFA) 108 (90 Base) MCG/ACT inhaler Inhale into the lungs as needed. 12/31/18   [provider]  ALPHA LIPOIC ACID-BIOTIN PO Take 200 mg by mouth daily.     [provider]  baclofen (LIORESAL) 10 MG tablet Take by mouth daily. 02/13/19 02/13/20  [provider]  CRANBERRY FRUIT PO Take 1 capsule by mouth 2 (two) times a day.    [provider]  Cyanocobalamin (B-12) 3000 MCG CAPS Take 1 capsule by mouth daily.    [provider]  cyclobenzaprine (FLEXERIL) 10 MG tablet Take 10 mg by mouth as needed. for muscle spams 11/19/18   [provider]  dicyclomine (BENTYL) 10 MG capsule Take by mouth 3 (three) times daily as needed. 02/11/19  [provider]  DULoxetine (CYMBALTA) 60 MG capsule Take by mouth daily. 01/28/19 04/29/19  [provider]  famotidine (PEPCID) 20 MG tablet Take by mouth 2 (two) times daily. 02/11/19   [provider]  fenofibrate (TRICOR) 145 MG tablet Take 145 mg by mouth daily. 01/14/19   [provider]  fluconazole (DIFLUCAN) 150 MG tablet Take by mouth. 12/19/18   [provider]  fluticasone furoate-vilanterol (BREO ELLIPTA) 100-25 MCG/INH AEPB Inhale into the lungs as needed. 01/21/19   [provider]  ibuprofen (ADVIL) 600 MG tablet Take 1 tablet (600 mg total) by mouth every 6 (six) hours as  needed. 10/19/18   Law, Bea Graff, PA-C  Insulin Glargine, 1 Unit Dial, (TOUJEO SOLOSTAR) 300 UNIT/ML SOPN Inject 80 Units into the skin at bedtime. 50 in the morning and 80 at bedtime    [provider]  ivabradine (CORLANOR) 5 MG TABS tablet Take 1 tablet (5 mg total) by mouth 2 (two) times daily with a meal. 04/23/19   Miquel Dunn, NP  levothyroxine (SYNTHROID) 150 MCG tablet Take 150 mcg by mouth daily. 01/14/19   [provider]  metFORMIN (GLUCOPHAGE) 500 MG tablet daily. 02/18/19   [provider]  metoprolol tartrate (LOPRESSOR) 50 MG tablet Take 1 tablet (50 mg total) by mouth 2 (two) times daily. 04/08/19 07/07/19  Miquel Dunn, NP  NOVOLOG FLEXPEN 100 UNIT/ML FlexPen Inject 25 Units into the skin 2 (two) times daily with a meal. Every meal 04/19/19   [provider]  ondansetron (ZOFRAN) 4 MG tablet as needed. 12/19/18   [provider]  pregabalin (LYRICA) 300 MG capsule Take by mouth 2 (two) times daily.     [provider]  sacubitril-valsartan (ENTRESTO) 24-26 MG Take 1 tablet by mouth 2 (two) times daily. 04/29/19   Miquel Dunn, NP  spironolactone (ALDACTONE) 25 MG tablet Take 1 tablet (25 mg total) by mouth daily. 04/27/19 05/27/19  Miquel Dunn, NP    Allergies    Clarithromycin, Shellfish allergy, Penicillins, and Prednisone  Review of Systems   Review of Systems  Constitutional: Negative for chills and fever.  HENT: Negative for ear pain and sore throat.   Eyes: Negative for pain and visual disturbance.  Respiratory: Negative for cough and shortness of breath.   Cardiovascular: Negative for chest pain and palpitations.  Gastrointestinal: Positive for abdominal pain and vomiting.  Genitourinary: Negative for dysuria and hematuria.  Musculoskeletal: Negative for arthralgias and back pain.  Skin: Negative for color change and rash.  Neurological: Negative for seizures and syncope.  All  other systems reviewed and are negative.   Physical Exam Updated Vital Signs BP (!) 149/98   Pulse (!) 146   Temp 97.9 F (36.6 C) (Oral)   Resp (!) 25   SpO2 97%   Physical Exam Vitals and nursing note reviewed.  Constitutional:      General: [name] not in acute distress.    Appearance: [name] well-developed.  HENT:     Head: Normocephalic and atraumatic.  Eyes:     Conjunctiva/sclera: Conjunctivae normal.  Cardiovascular:     Rate and Rhythm: Regular rhythm. Tachycardia present.     Heart sounds: No murmur.  Pulmonary:     Effort: Pulmonary effort is normal. No respiratory distress.     Breath sounds: Normal breath sounds.  Abdominal:     Palpations: Abdomen is soft.     Tenderness: There is no abdominal tenderness.  Comments: Tenderness palpation epigastrium, left upper quadrant, no rebound or guarding  Musculoskeletal:     Cervical back: Neck supple.  Skin:    General: Skin is warm and dry.  Neurological:     Mental Status: [name] alert.     ED Results / Procedures / Treatments   Labs (all labs ordered are listed, but only abnormal results are displayed) Labs Reviewed  COMPREHENSIVE METABOLIC PANEL - Abnormal; Notable for the following components:      Result Value   Sodium 133 (*)    CO2 19 (*)    Glucose, Bld 132 (*)    BUN 35 (*)    Creatinine, Ser 1.10 (*)    Anion gap 16 (*)    All other components within normal limits  CBC WITH DIFFERENTIAL/PLATELET  LIPASE, BLOOD  URINALYSIS, ROUTINE W REFLEX MICROSCOPIC  HCG, SERUM, QUALITATIVE    EKG None  Radiology PCV MYOCARDIAL PERFUSION WITH LEXISCAN  Result Date: 05/09/2019 Carlton Adam (Walking with Jaci Carrel) Sestamibi Stress Test 05/08/2019: Nondiagnostic ECG stress. Myocardial perfusion is normal. LV is normal in size both in rest and stress images. Stress LV EF:  calculated at 38% but visually normal. No previous exam available for comparison. Low risk study.    Procedures Procedures  (including critical care time)  Medications Ordered in ED Medications  HYDROmorphone (DILAUDID) injection 1 mg (has no administration in time range)  metoprolol tartrate (LOPRESSOR) tablet 50 mg (has no administration in time range)  sodium chloride 0.9 % bolus 1,000 mL (1,000 mLs Intravenous New Bag/Given 05/09/19 2041)  fentaNYL (SUBLIMAZE) injection 50 mcg (50 mcg Intravenous Given 05/09/19 2114)  ondansetron (ZOFRAN) injection 4 mg (4 mg Intravenous Given 05/09/19 2041)    ED Course  I have reviewed the triage vital signs and the nursing notes.  Pertinent labs & imaging results that were available during my care of the patient were reviewed by me and considered in my medical decision making (see chart for details).    MDM Rules/Calculators/A&P                      29 year old lady presented to ER with chief complaint of epigastric abdominal pain as well as nausea.  On exam patient noted to be in no acute distress, tachycardic, reports long history of sinus tachycardia.  Did note some tenderness, reported history of chronic otitis.  Labs were grossly within normal limits, lipase actually normal.  CT scan ordered to further investigate.  Given symptom control.  While awaiting CT scan, patient reassessment, signed out to Dr. Christy Gentles.  Please refer to his note for final plan and disposition.  Final Clinical Impression(s) / ED Diagnoses Final diagnoses:  Epigastric abdominal pain  Nausea and vomiting, intractability of vomiting not specified, unspecified vomiting type    Rx / DC Orders ED Discharge Orders    None       Lucrezia Starch, MD 05/09/19 2250

## 2019-05-10 ENCOUNTER — Telehealth: Payer: Self-pay | Admitting: Cardiology

## 2019-05-10 ENCOUNTER — Emergency Department (HOSPITAL_COMMUNITY): Payer: BC Managed Care – PPO

## 2019-05-10 DIAGNOSIS — R109 Unspecified abdominal pain: Secondary | ICD-10-CM | POA: Diagnosis not present

## 2019-05-10 DIAGNOSIS — E875 Hyperkalemia: Secondary | ICD-10-CM

## 2019-05-10 LAB — URINALYSIS, ROUTINE W REFLEX MICROSCOPIC
Bilirubin Urine: NEGATIVE
Glucose, UA: >=500 mg/dL — AB
Ketones, ur: NEGATIVE mg/dL
Nitrite: NEGATIVE
Protein, ur: NEGATIVE mg/dL
Specific Gravity, Urine: 1.024 (ref 1.005–1.030)
pH: 5 (ref 5.0–8.0)

## 2019-05-10 LAB — BASIC METABOLIC PANEL
BUN/Creatinine Ratio: 30 — ABNORMAL HIGH (ref 9–23)
BUN: 34 mg/dL — ABNORMAL HIGH (ref 6–20)
CO2: 22 mmol/L (ref 20–29)
Calcium: 9.7 mg/dL (ref 8.7–10.2)
Chloride: 89 mmol/L — ABNORMAL LOW (ref 96–106)
Creatinine, Ser: 1.13 mg/dL — ABNORMAL HIGH (ref 0.57–1.00)
GFR calc Af Amer: 76 mL/min/{1.73_m2} (ref >59)
GFR calc non Af Amer: 66 mL/min/{1.73_m2} (ref >59)
Glucose: 741 mg/dL (ref 65–99)
Potassium: 5.9 mmol/L — ABNORMAL HIGH (ref 3.5–5.2)
Sodium: 129 mmol/L — ABNORMAL LOW (ref 134–144)

## 2019-05-10 MED ORDER — FENTANYL CITRATE (PF) 100 MCG/2ML IJ SOLN
100.0000 ug | Freq: Once | INTRAMUSCULAR | Status: AC
  Administered 2019-05-10: 100 ug via INTRAVENOUS
  Filled 2019-05-10: qty 2

## 2019-05-10 MED ORDER — HYDROCODONE-ACETAMINOPHEN 5-325 MG PO TABS
1.0000 | ORAL_TABLET | Freq: Once | ORAL | Status: AC
  Administered 2019-05-10: 1 via ORAL
  Filled 2019-05-10: qty 1

## 2019-05-10 MED ORDER — IOHEXOL 300 MG/ML  SOLN
100.0000 mL | Freq: Once | INTRAMUSCULAR | Status: AC | PRN
  Administered 2019-05-10: 100 mL via INTRAVENOUS

## 2019-05-10 MED ORDER — HYDROCODONE-ACETAMINOPHEN 5-325 MG PO TABS: 1.0000 | ORAL_TABLET | Freq: Four times a day (QID) | ORAL | 0 refills | Status: DC | PRN

## 2019-05-10 MED ORDER — ONDANSETRON HCL 4 MG/2ML IJ SOLN
4.0000 mg | Freq: Once | INTRAMUSCULAR | Status: AC
  Administered 2019-05-10: 4 mg via INTRAVENOUS
  Filled 2019-05-10: qty 2

## 2019-05-10 NOTE — ED Notes (Signed)
This RN removed this pts oxygen and the pts oxygen saturation maintained at 95% and greater with no SOB.

## 2019-05-10 NOTE — ED Notes (Signed)
Pt. Transported to CT 

## 2019-05-10 NOTE — ED Provider Notes (Signed)
I assumed care in signout to f/u on CT imaging.  Patient had a drop in her pulse ox after receiving fentanyl This is now improved.  CT imaging does not reveal acute pancreatitis.  No acute surgical issues.  She was found to have a left ovarian cyst as well as a liver hemangioma.  She reports a history of PCOS.  She did have an outpatient ultrasound of her ovaries.  She will also need to have a MRI of her liver as an outpatient.  She was instructed to do this and she will follow-up with her GI specialist for the liver MRI   Ripley Fraise, MD 05/10/19 [phone]

## 2019-05-10 NOTE — ED Provider Notes (Signed)
Patient is improved.  Vitals appropriate.  She feels well for discharge.  Encouraged follow-up with her GI specialist BP 126/81   Pulse (!) 110   Temp 97.9 F (36.6 C) (Oral)   Resp 20   SpO2 95%     Ripley Fraise, MD 05/10/19 [phone]

## 2019-05-10 NOTE — Telephone Encounter (Signed)
I discussed critical lab results with the patient, glucose noted to be 741, which she reports has been elevated even at home.  Of note she was seen in the emergency room yesterday for abdominal pain and elevated glucose, no acute pancreatitis was noted.  I have asked her to hold her Entresto in view of elevated potassium levels.  Also encouraged her to take half a tablet of Aldactone for now. Potassium level has been stable in the past with 25 mg. Will need repeat BMP next week. [name] scheduled to see his back on 2/24.  Encouraged her to contact me for any concerns.

## 2019-05-10 NOTE — Discharge Instructions (Signed)
You will need to have an MRI of your liver.  You can follow-up with your GI specialist for this in the next month.  You should also have an ultrasound of your ovaries in the next 12 weeks.  Follow-up with your gynecologist for this

## 2019-05-10 NOTE — ED Notes (Signed)
Pts oxygen saturation dropped to 70% on RA; Respiratory rate did not change and pt was placed on 2.5L [place] and oxygenation rose to 95% and more. Dr. Christy Gentles made aware.

## 2019-05-13 ENCOUNTER — Ambulatory Visit: Payer: BC Managed Care – PPO | Admitting: Cardiology

## 2019-05-14 DIAGNOSIS — H34832 Tributary (branch) retinal vein occlusion, left eye, with macular edema: Secondary | ICD-10-CM | POA: Diagnosis not present

## 2019-05-14 DIAGNOSIS — R300 Dysuria: Secondary | ICD-10-CM | POA: Diagnosis not present

## 2019-05-14 DIAGNOSIS — R262 Difficulty in walking, not elsewhere classified: Secondary | ICD-10-CM | POA: Diagnosis not present

## 2019-05-14 DIAGNOSIS — M79602 Pain in left arm: Secondary | ICD-10-CM | POA: Diagnosis not present

## 2019-05-14 DIAGNOSIS — R2689 Other abnormalities of gait and mobility: Secondary | ICD-10-CM | POA: Diagnosis not present

## 2019-05-14 DIAGNOSIS — M6281 Muscle weakness (generalized): Secondary | ICD-10-CM | POA: Diagnosis not present

## 2019-05-14 DIAGNOSIS — E1069 Type 1 diabetes mellitus with other specified complication: Secondary | ICD-10-CM | POA: Diagnosis not present

## 2019-05-14 NOTE — Telephone Encounter (Signed)
Please do a appeal. On maximally tolerated metoprolol and cannot further increase due to hypotension. Needs corlanor for tachycardia and CHF

## 2019-05-14 NOTE — Telephone Encounter (Signed)
Corlanor was denied other meds are covered and dont require a prior auth Propranolol 60 Mg Cs24 tier-2 NOT Required* Metoprolol Tartrate 25 Mg Tab tier-3 NOT Required* Metoprolol Succinate 25 Mg Tb24 tier-4 NOT Required* Bisoprolol Fumarate 5 Mg Tab tier-5 NOT Required* Carvedilol 25 Mg Tab tier-6 NOT Required* Labetalol 200 Mg Tab tier-7 NOT Required* Propranolol 10 Mg Tab tier-8 NOT Required* Nadolol 20 Mg Tab tier-9 NOT Required* Atenolol 50 Mg Tab tier-10 NOT Required*

## 2019-05-14 NOTE — Telephone Encounter (Signed)
Thank you :)

## 2019-05-14 NOTE — Telephone Encounter (Signed)
Med was approved, pt aware 

## 2019-05-16 DIAGNOSIS — M79602 Pain in left arm: Secondary | ICD-10-CM | POA: Diagnosis not present

## 2019-05-16 DIAGNOSIS — R1010 Upper abdominal pain, unspecified: Secondary | ICD-10-CM | POA: Diagnosis not present

## 2019-05-16 DIAGNOSIS — R262 Difficulty in walking, not elsewhere classified: Secondary | ICD-10-CM | POA: Diagnosis not present

## 2019-05-16 DIAGNOSIS — R140 Abdominal distension (gaseous): Secondary | ICD-10-CM | POA: Diagnosis not present

## 2019-05-16 DIAGNOSIS — K582 Mixed irritable bowel syndrome: Secondary | ICD-10-CM | POA: Diagnosis not present

## 2019-05-16 DIAGNOSIS — R2689 Other abnormalities of gait and mobility: Secondary | ICD-10-CM | POA: Diagnosis not present

## 2019-05-16 DIAGNOSIS — K861 Other chronic pancreatitis: Secondary | ICD-10-CM | POA: Diagnosis not present

## 2019-05-16 DIAGNOSIS — M6281 Muscle weakness (generalized): Secondary | ICD-10-CM | POA: Diagnosis not present

## 2019-05-21 DIAGNOSIS — M79602 Pain in left arm: Secondary | ICD-10-CM | POA: Diagnosis not present

## 2019-05-21 DIAGNOSIS — M6281 Muscle weakness (generalized): Secondary | ICD-10-CM | POA: Diagnosis not present

## 2019-05-21 DIAGNOSIS — R2689 Other abnormalities of gait and mobility: Secondary | ICD-10-CM | POA: Diagnosis not present

## 2019-05-21 DIAGNOSIS — R262 Difficulty in walking, not elsewhere classified: Secondary | ICD-10-CM | POA: Diagnosis not present

## 2019-05-22 ENCOUNTER — Ambulatory Visit: Payer: BC Managed Care – PPO | Admitting: Cardiology

## 2019-05-23 DIAGNOSIS — R2689 Other abnormalities of gait and mobility: Secondary | ICD-10-CM | POA: Diagnosis not present

## 2019-05-23 DIAGNOSIS — R262 Difficulty in walking, not elsewhere classified: Secondary | ICD-10-CM | POA: Diagnosis not present

## 2019-05-23 DIAGNOSIS — M6281 Muscle weakness (generalized): Secondary | ICD-10-CM | POA: Diagnosis not present

## 2019-05-23 DIAGNOSIS — M79602 Pain in left arm: Secondary | ICD-10-CM | POA: Diagnosis not present

## 2019-05-28 DIAGNOSIS — M79602 Pain in left arm: Secondary | ICD-10-CM | POA: Diagnosis not present

## 2019-05-28 DIAGNOSIS — M6281 Muscle weakness (generalized): Secondary | ICD-10-CM | POA: Diagnosis not present

## 2019-05-28 DIAGNOSIS — R2689 Other abnormalities of gait and mobility: Secondary | ICD-10-CM | POA: Diagnosis not present

## 2019-05-28 DIAGNOSIS — R262 Difficulty in walking, not elsewhere classified: Secondary | ICD-10-CM | POA: Diagnosis not present

## 2019-05-30 DIAGNOSIS — R2689 Other abnormalities of gait and mobility: Secondary | ICD-10-CM | POA: Diagnosis not present

## 2019-05-30 DIAGNOSIS — M79602 Pain in left arm: Secondary | ICD-10-CM | POA: Diagnosis not present

## 2019-05-30 DIAGNOSIS — R262 Difficulty in walking, not elsewhere classified: Secondary | ICD-10-CM | POA: Diagnosis not present

## 2019-05-30 DIAGNOSIS — M6281 Muscle weakness (generalized): Secondary | ICD-10-CM | POA: Diagnosis not present

## 2019-06-04 DIAGNOSIS — M79602 Pain in left arm: Secondary | ICD-10-CM | POA: Diagnosis not present

## 2019-06-04 DIAGNOSIS — R262 Difficulty in walking, not elsewhere classified: Secondary | ICD-10-CM | POA: Diagnosis not present

## 2019-06-04 DIAGNOSIS — R2689 Other abnormalities of gait and mobility: Secondary | ICD-10-CM | POA: Diagnosis not present

## 2019-06-04 DIAGNOSIS — M6281 Muscle weakness (generalized): Secondary | ICD-10-CM | POA: Diagnosis not present

## 2019-06-06 ENCOUNTER — Encounter (HOSPITAL_COMMUNITY): Payer: Self-pay | Admitting: Emergency Medicine

## 2019-06-06 ENCOUNTER — Ambulatory Visit: Payer: BC Managed Care – PPO | Admitting: Cardiology

## 2019-06-06 ENCOUNTER — Emergency Department (HOSPITAL_COMMUNITY)
Admission: EM | Admit: 2019-06-06 | Discharge: 2019-06-06 | Discharge status: Against Medical Advice | Payer: BC Managed Care – PPO | Attending: Emergency Medicine | Admitting: Emergency Medicine

## 2019-06-06 ENCOUNTER — Other Ambulatory Visit: Payer: Self-pay

## 2019-06-06 ENCOUNTER — Encounter: Payer: Self-pay | Admitting: Cardiology

## 2019-06-06 VITALS — BP 129/79 | HR 82 | Temp 98.6°F | Ht <= 58 in | Wt 122.5 lb

## 2019-06-06 DIAGNOSIS — R000 Tachycardia, unspecified: Secondary | ICD-10-CM | POA: Diagnosis not present

## 2019-06-06 DIAGNOSIS — I10 Essential (primary) hypertension: Secondary | ICD-10-CM | POA: Diagnosis not present

## 2019-06-06 DIAGNOSIS — Z794 Long term (current) use of insulin: Secondary | ICD-10-CM | POA: Diagnosis not present

## 2019-06-06 DIAGNOSIS — Z79899 Other long term (current) drug therapy: Secondary | ICD-10-CM | POA: Insufficient documentation

## 2019-06-06 DIAGNOSIS — E039 Hypothyroidism, unspecified: Secondary | ICD-10-CM | POA: Insufficient documentation

## 2019-06-06 DIAGNOSIS — E1065 Type 1 diabetes mellitus with hyperglycemia: Secondary | ICD-10-CM | POA: Insufficient documentation

## 2019-06-06 DIAGNOSIS — R4020 Unspecified coma: Secondary | ICD-10-CM | POA: Diagnosis not present

## 2019-06-06 DIAGNOSIS — I5022 Chronic systolic (congestive) heart failure: Secondary | ICD-10-CM | POA: Diagnosis not present

## 2019-06-06 DIAGNOSIS — R55 Syncope and collapse: Secondary | ICD-10-CM | POA: Insufficient documentation

## 2019-06-06 DIAGNOSIS — E1165 Type 2 diabetes mellitus with hyperglycemia: Secondary | ICD-10-CM | POA: Diagnosis not present

## 2019-06-06 LAB — URINALYSIS, ROUTINE W REFLEX MICROSCOPIC
Bilirubin Urine: NEGATIVE
Glucose, UA: >=500 mg/dL — AB
Hgb urine dipstick: NEGATIVE
Ketones, ur: NEGATIVE mg/dL
Leukocytes,Ua: NEGATIVE
Nitrite: NEGATIVE
Protein, ur: NEGATIVE mg/dL
Specific Gravity, Urine: 1.021 (ref 1.005–1.030)
pH: 6 (ref 5.0–8.0)

## 2019-06-06 LAB — BASIC METABOLIC PANEL
Anion gap: 14 (ref 5–15)
BUN: 29 mg/dL — ABNORMAL HIGH (ref 6–20)
CO2: 20 mmol/L — ABNORMAL LOW (ref 22–32)
Calcium: 9.7 mg/dL (ref 8.9–10.3)
Chloride: 94 mmol/L — ABNORMAL LOW (ref 98–111)
Creatinine, Ser: 1.11 mg/dL — ABNORMAL HIGH (ref 0.44–1.00)
GFR calc Af Amer: >60 mL/min (ref >60)
GFR calc non Af Amer: >60 mL/min (ref >60)
Glucose, Bld: 667 mg/dL (ref 70–99)
Potassium: 4.6 mmol/L (ref 3.5–5.1)
Sodium: 128 mmol/L — ABNORMAL LOW (ref 135–145)

## 2019-06-06 LAB — CBC
HCT: 36.4 % (ref 36.0–46.0)
Hemoglobin: 12 g/dL (ref 12.0–15.0)
MCH: 27.6 pg (ref 26.0–34.0)
MCHC: 33 g/dL (ref 30.0–36.0)
MCV: 83.9 fL (ref 80.0–100.0)
Platelets: 183 10*3/uL (ref 150–400)
RBC: 4.34 MIL/uL (ref 3.87–5.11)
RDW: 13.3 % (ref 11.5–15.5)
WBC: 5.2 10*3/uL (ref 4.0–10.5)
nRBC: 0 % (ref 0.0–0.2)

## 2019-06-06 MED ORDER — SACUBITRIL-VALSARTAN 24-26 MG PO TABS: 1.0000 | ORAL_TABLET | Freq: Two times a day (BID) | ORAL | Status: DC

## 2019-06-06 MED ORDER — SODIUM CHLORIDE 0.9% FLUSH: 3.0000 mL | Freq: Once | INTRAVENOUS | Status: DC

## 2019-06-06 MED ORDER — IVABRADINE HCL 5 MG PO TABS: 5.0000 mg | ORAL_TABLET | Freq: Two times a day (BID) | ORAL | 1 refills | Status: DC

## 2019-06-06 NOTE — Progress Notes (Signed)
Primary Physician:  Sue Lush, PA-C  Patient ID: Jennifer [name], female    DOB: 08/24/1990, 29 y.o.   MRN: [id number]  Subjective:   Chief Complaint  Patient presents with  . Congestive Heart Failure  . Follow-up    2 week  . Results    stress test   HPI: Jennifer [name]  is a 29 y.o. female  with hypertension, type 1 diabetes with peripheral neuropathy,  astrocytoma s/p surgery in 1997 and chemotherapy with residual right ptosis and left hemiparesis , chronic pancreatitis, non ischemic cardiomyopathy with chronic systolic heart failure dating back to 2019, hypertension, and hypertriglycerdemia (has Lipoprotein lipase deficiency). [name] presently on maximum beta-blocker therapy for tachycardia and also: Was recently started.  She was also started on Entresto but has been held as she presented with hyperkalemia and also hyperglycemia. 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   She continues to work with Dr. Chalmers Cater for management of Diabetes. She has upcoming appt with [facility] this month.  [name] careful with her diet. Triglycerides have been as high as 15,000 in 2018. Last triglyceride level in April 2019 was 1471. Mother has high triglycerides in the 700-800 range and also has uncontrolled diabetes. [name] currently on Fenofibrate. States that with statins in the past ended up with frequent pancreatitis, in past apparently did not respond to Niacin and has intolerance to Vascepa as she developed rash.   Since being on: She had felt markedly improved with regard to overall wellbeing and also palpitations.  But due to insurance reasons although she got prior approval she is presently stopped taking it for the last few days as it was expensive.  [name] going to take to a different pharmacy.  Otherwise no other specific symptoms.  Past Medical History:  Diagnosis Date  . Astrocytoma ([place])   . Brain tumor ([place]) 03/29/1995  . Cholesterosis   . Diabetes mellitus  without complication ([place])   . DM (diabetes mellitus) ([place]) 10/10/2018  . Fatty liver   . HTN (hypertension) 10/10/2018  . Hypertension   . Hypothyroidism 10/10/2018  . Hypothyroidism   . Lipoprotein deficiency   . Lung disease    longevity long term  . Pancreatitis   . Polycystic ovary syndrome   . Thyroid disease     Past Surgical History:  Procedure Laterality Date  . ABDOMINAL SURGERY     pt states during miscarriage got her intestine  . BRAIN SURGERY    . EYE MUSCLE SURGERY Right 03/28/2014  . VENTRICULOSTOMY  03/28/1997    Social History   Socioeconomic History  . Marital status: Married    Spouse name: Not on file  . Number of children: 0  . Years of education: Not on file  . Highest education level: Not on file  Occupational History  . Not on file  Tobacco Use  . Smoking status: Never Smoker  . Smokeless tobacco: Never Used  Substance and Sexual Activity  . Alcohol use: Never  . Drug use: Never  . Sexual activity: Not on file  Other Topics Concern  . Not on file  Social History Narrative  . Not on file   Social Determinants of Health   Financial Resource Strain:   . Difficulty of Paying Living Expenses:   Food Insecurity:   . Worried About Charity fundraiser in the Last Year:   . Arboriculturist in the Last Year:   [employer]  Needs:   . Lack of Transportation (Medical):   Marland Kitchen Lack of Transportation (Non-Medical):   Physical Activity:   . Days of Exercise per Week:   . Minutes of Exercise per Session:   Stress:   . Feeling of Stress :   Social Connections:   . Frequency of Communication with Friends and Family:   . Frequency of Social Gatherings with Friends and Family:   . Attends Religious Services:   . Active Member of Clubs or Organizations:   . Attends Archivist Meetings:   Marland Kitchen Marital Status:   Intimate Partner Violence:   . Fear of Current or Ex-Partner:   . Emotionally Abused:   Marland Kitchen Physically Abused:   . Sexually Abused:       Review of Systems  Cardiovascular: Positive for palpitations. Negative for chest pain and leg swelling.  Respiratory: Positive for shortness of breath.   Gastrointestinal: Negative for melena.  Neurological: Positive for numbness and paresthesias.   Objective:  Blood pressure 129/79, pulse 82, temperature 98.6 F (37 C), height 4' 9"  (1.448 m), weight 122 lb 8 oz (55.6 kg), SpO2 95 %. Body mass index is 26.51 kg/m. Vitals with BMI 06/06/2019 05/10/2019 05/10/2019  Height 4' 9"  - -  Weight 122 lbs 8 oz - -  BMI 01.0 - -  Systolic 932 355 -  Diastolic 79 81 -  Pulse 82 115 110    Physical Exam  Constitutional: Vital signs are normal.  Short stature.  Scoliosis present.   Cardiovascular: Normal rate, regular rhythm and intact distal pulses.  No JVD. No leg edema  Pulmonary/Chest: Effort normal and breath sounds normal. No accessory muscle usage. No respiratory distress.  Abdominal: Soft. Bowel sounds are normal.  Vitals reviewed.  Radiology: No results found.  Laboratory examination:    CMP Latest Ref Rng & Units 05/09/2019 05/09/2019 08/05/2018  Glucose 70 - 99 mg/dL 132(H) 741(HH) -  BUN 6 - 20 mg/dL 35(H) 34(H) -  Creatinine 0.44 - 1.00 mg/dL 1.10(H) 1.13(H) -  Sodium 135 - 145 mmol/L 133(L) 129(L) 135  Potassium 3.5 - 5.1 mmol/L 4.1 5.9(H) 3.3(L)  Chloride 98 - 111 mmol/L 98 89(L) -  CO2 22 - 32 mmol/L 19(L) 22 -  Calcium 8.9 - 10.3 mg/dL 10.2 9.7 -  Total Protein 6.5 - 8.1 g/dL 7.7 - -  Total Bilirubin 0.3 - 1.2 mg/dL 0.9 - -  Alkaline Phos 38 - 126 U/L 49 - -  AST 15 - 41 U/L 35 - -  ALT 0 - 44 U/L 35 - -   CBC Latest Ref Rng & Units 05/09/2019 08/05/2018 08/04/2018  WBC 4.0 - 10.5 K/uL 7.0 - 10.9(H)  Hemoglobin 12.0 - 15.0 g/dL 13.8 8.8(L) 11.9(L)  Hematocrit 36.0 - 46.0 % 41.0 26.0(L) 32.0(L)  Platelets 150 - 400 K/uL 150 - 247   Lipid Panel  No results found for: CHOL, TRIG, HDL, CHOLHDL, VLDL, LDLCALC, LDLDIRECT HEMOGLOBIN A1C No results found for: HGBA1C,  MPG TSH No results for input(s): TSH in the last 8760 hours.  PRN Meds:. Medications Discontinued During This Encounter  Medication Reason  . ivabradine (CORLANOR) 5 MG TABS tablet    Current Meds  Medication Sig  . Acetylcarnitine HCl (ACETYL L-CARNITINE) 500 MG CAPS Take 500 mg by mouth daily.  Marland Kitchen albuterol (VENTOLIN HFA) 108 (90 Base) MCG/ACT inhaler Inhale 2 puffs into the lungs as needed for wheezing or shortness of breath.   . ALPHA LIPOIC ACID-BIOTIN PO Take 200 mg  by mouth daily.   . baclofen (LIORESAL) 10 MG tablet Take 10 mg by mouth daily.   Marland Kitchen CRANBERRY FRUIT PO Take 1 capsule by mouth 2 (two) times a day.  . Cyanocobalamin (VITAMIN B12 PO) Take 1 Dose by mouth daily.  . cyclobenzaprine (FLEXERIL) 10 MG tablet Take 10 mg by mouth as needed. for muscle spams  . dicyclomine (BENTYL) 10 MG capsule Take 10 mg by mouth 3 (three) times daily as needed for spasms.   . DULoxetine (CYMBALTA) 60 MG capsule Take 60 mg by mouth daily.   . famotidine (PEPCID) 20 MG tablet Take 20 mg by mouth 2 (two) times daily.   . fenofibrate (TRICOR) 145 MG tablet Take 145 mg by mouth daily.  . fluticasone furoate-vilanterol (BREO ELLIPTA) 100-25 MCG/INH AEPB Inhale 1 puff into the lungs as needed (shortness of breath).   Marland Kitchen HYDROcodone-acetaminophen (NORCO/VICODIN) 5-325 MG tablet Take 1 tablet by mouth every 6 (six) hours as needed.  Marland Kitchen ibuprofen (ADVIL) 600 MG tablet Take 1 tablet (600 mg total) by mouth every 6 (six) hours as needed.  . Insulin Glargine, 1 Unit Dial, (TOUJEO SOLOSTAR) 300 UNIT/ML SOPN Inject 50-80 Units into the skin at bedtime. 50 units in the morning and 80 units at bedtime  . levothyroxine (SYNTHROID) 150 MCG tablet Take 150 mcg by mouth daily.  . metFORMIN (GLUCOPHAGE) 500 MG tablet Take 500 mg by mouth daily.   . metoprolol tartrate (LOPRESSOR) 50 MG tablet Take 1 tablet (50 mg total) by mouth 2 (two) times daily.  Marland Kitchen NOVOLOG FLEXPEN 100 UNIT/ML FlexPen Inject 25 Units into the  skin 2 (two) times daily with a meal. Every meal  . ondansetron (ZOFRAN) 4 MG tablet Take 4 mg by mouth as needed for nausea or vomiting.   . pregabalin (LYRICA) 300 MG capsule Take 300 mg by mouth 2 (two) times daily.   Marland Kitchen spironolactone (ALDACTONE) 25 MG tablet Take 1 tablet (25 mg total) by mouth daily.   Current Facility-Administered Medications for the 06/06/19 encounter (Office Visit) with [name], MD  Medication  . sacubitril-valsartan (ENTRESTO) 24-26 mg per tablet   CT of the abdomen and pelvis 12/06/2018: 1. Dominant follicle in the left ovary with some gradient layering attenuation which could reflect a non ruptured hemorrhagic cyst. May be a pain generator in this patient. Could consider 6-12 week follow-up ultrasound to ensure resolution.   2. Diminutive appearance of the uterus which enhances avidly, a nonspecific finding. Morphology can be better evaluated at the time of follow-up ultrasound. 3. Age advanced noncalcified atheromatous plaque in the distal abdominal aorta just proximal to the bifurcation. 4. Geographic hypoattenuation in the posterior left lobe and caudate lobe of the liver most likely reflecting focal fatty infiltration. 5. Hyperenhancing focus in the anterior right lobe liver could reflect flash filling hemangioma versus transient hepatic attenuation difference. If further determination is clinically warranted, outpatient MR with contrast could be obtained.  Cardiac Studies:   Echocardiogram 04/16/2019:  Left ventricle cavity is normal in size. Mild concentric hypertrophy of  the left ventricle. Normal global wall motion. Moderately depressed LV  systolic function with EF 40%. Doppler evidence of grade I (impaired)  diastolic dysfunction, normal LAP. Calculated EF 40%.  Trileaflet aortic valve. Mild to moderate aortic regurgitation.  Moderate (Grade III) mitral regurgitation.  Moderate tricuspid regurgitation. Estimated pulmonary artery systolic  pressure is 30  mmHg.  Mild pulmonic regurgitation.  No significant change compared to previous study on 10/03/2018.  Cardiac MRI  08/04/2017:  Result Impression  1. The left ventricle is normal in size and has moderately decreased function. There is mid to basal inferoseptal hypokinesis. The right ventricle is normal in size and has mildly decreased function. 2. There is evidence of subepicardial enhancement of the mid inferoseptal wall segment, which is compatible with myocarditis. There is also a very small focal subendocardial enhancement in the mid inferoseptum which is compatible with infarction.  3. There is evidence of myocardial edema of the mid epicardial inferoseptum which is compatible with acute myocarditis. 4. Small circumferential pericardial effusion   Assessment:     ICD-10-CM   1. Chronic systolic (congestive) heart failure (HCC)  I50.22 ivabradine (CORLANOR) 5 MG TABS tablet    sacubitril-valsartan (ENTRESTO) 24-26 mg per tablet    Brain natriuretic peptide  2. Essential hypertension  D14 Basic metabolic panel  3. Sinus tachycardia  R00.0 ivabradine (CORLANOR) 5 MG TABS tablet    sacubitril-valsartan (ENTRESTO) 24-26 mg per tablet    EKG 04/29/2019: Normal sinus rhythm at 84 bpm, normal axis, no evidence of ischemia.  Recommendations:   Jennifer [name]  is a 29 y.o. female  with hypertension, type 1 diabetes with peripheral neuropathy,  astrocytoma s/p surgery in 1997 and chemotherapy with residual right ptosis and left hemiparesis , chronic pancreatitis, non ischemic cardiomyopathy with chronic systolic heart failure dating back to 2019, hypertension, and hypertriglycerdemia (has Lipoprotein lipase deficiency). [name] presently on maximum beta-blocker therapy for tachycardia and also: Was recently started.  She was also started on Entresto but has been held as she presented with hyperkalemia and also hyperglycemia. 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    I reviewed her previously performed MRI from 2019 which revealed significant myocardial edema, scarring and low EF, nuclear stress test from January 2021 reveals uniform perfusion, suspect microvascular disease in view of familial hypertriglyceridemia, uncontrolled diabetes mellitus.  Fortunately, patient symptoms of dyspnea, palpitations have all improved since being on metoprolol and Corlanor.  She has had problems with insurance although it is got prior approval, it was very expensive, she is going to look into a different pharmacy.  Samples of the medication given to the patient.  Heart rate has started to rise again since she is not on: But she is not tachycardic today.  She was started on Entresto 24-26 mg BID, but presented with abdominal pain on 05/08/2018 and found to have hyperglycemia, hyperkalemia, serum amylase and lipase negative.  [name] was discontinued at this point.  I will initiate Entresto again and slowly titrated upwards as tolerated.  Will obtain BMP in 10 days.  She also spironolactone and this needs to be kept in mind and may need to be discontinued and started on regular HCTZ or chlorthalidone if necessary.  She has been referred to [facility] for evaluation of her hypertriglycerdemia and lipodystrophy and is scheduled for consultation this month. She may benefit from participation in ongoing clinical trials.  CC: Dr. Jacelyn Pi  [name], MD, [facility] 06/06/2019, 10:56 AM [facility]. [place] Office: [phone]

## 2019-06-06 NOTE — ED Triage Notes (Addendum)
Pt BIB [facility], found unresponsive in her car while waiting for her kids at school. Reports that she was feeling more fatigued and dizzy earlier in the day. EMS VS: BP 140/90, HR 104, CBG 590. Pt A&O, GCS 15 at this time.

## 2019-06-06 NOTE — ED Provider Notes (Signed)
[facility] Provider Note   CSN: [id number] Arrival date & time: 06/06/19  1739     History Chief Complaint  Patient presents with  . Loss of Consciousness    Jennifer [name] is a 29 y.o. female with a past medical history of type 1 diabetes, hypertension, astrocytoma presenting to the ED for loss of consciousness.  Patient was found unresponsive in her car earlier today, found to be hyperglycemic.  She states that she saw her PCP earlier in the week and was told to adjust her insulin.  Patient states that this is most likely the cause of her episode today.  [name] requesting discharge and to manage her symptoms at home.  HPI     Past Medical History:  Diagnosis Date  . Astrocytoma ([place])   . Brain tumor ([place]) 03/29/1995  . Cholesterosis   . Diabetes mellitus without complication ([place])   . DM (diabetes mellitus) ([place]) 10/10/2018  . Fatty liver   . HTN (hypertension) 10/10/2018  . Hypertension   . Hypothyroidism 10/10/2018  . Hypothyroidism   . Lipoprotein deficiency   . Lung disease    longevity long term  . Pancreatitis   . Polycystic ovary syndrome   . Thyroid disease     Patient Active Problem List   Diagnosis Date Noted  . DM (diabetes mellitus) ([place]) 10/10/2018  . Hypothyroidism 10/10/2018    Past Surgical History:  Procedure Laterality Date  . ABDOMINAL SURGERY     pt states during miscarriage got her intestine  . BRAIN SURGERY    . EYE MUSCLE SURGERY Right 03/28/2014  . VENTRICULOSTOMY  03/28/1997     OB History   No obstetric history on file.     Family History  Problem Relation Age of Onset  . Diabetes Mother   . Hypertension Mother   . Hyperlipidemia Mother   . Thyroid disease Mother   . Hypertension Father   . Diabetes Father     Social History   Tobacco Use  . Smoking status: Never Smoker  . Smokeless tobacco: Never Used  Substance Use Topics  . Alcohol use: Never  . Drug use: Never     Home Medications Prior to Admission medications   Medication Sig Start Date End Date Taking? Authorizing Provider  Acetylcarnitine HCl (ACETYL L-CARNITINE) 500 MG CAPS Take 500 mg by mouth daily.    [provider]  albuterol (VENTOLIN HFA) 108 (90 Base) MCG/ACT inhaler Inhale 2 puffs into the lungs as needed for wheezing or shortness of breath.  12/31/18   [provider]  ALPHA LIPOIC ACID-BIOTIN PO Take 200 mg by mouth daily.     [provider]  baclofen (LIORESAL) 10 MG tablet Take 10 mg by mouth daily.  02/13/19 02/13/20  [provider]  CRANBERRY FRUIT PO Take 1 capsule by mouth 2 (two) times a day.    [provider]  Cyanocobalamin (VITAMIN B12 PO) Take 1 Dose by mouth daily.    [provider]  cyclobenzaprine (FLEXERIL) 10 MG tablet Take 10 mg by mouth as needed. for muscle spams 11/19/18   [provider]  dicyclomine (BENTYL) 10 MG capsule Take 10 mg by mouth 3 (three) times daily as needed for spasms.  02/11/19   [provider]  DULoxetine (CYMBALTA) 60 MG capsule Take 60 mg by mouth daily.  01/28/19 06/06/19  [provider]  famotidine (PEPCID) 20 MG tablet Take 20 mg by mouth 2 (two) times  daily.  02/11/19   [provider]  fenofibrate (TRICOR) 145 MG tablet Take 145 mg by mouth daily. 01/14/19   [provider]  fluticasone furoate-vilanterol (BREO ELLIPTA) 100-25 MCG/INH AEPB Inhale 1 puff into the lungs as needed (shortness of breath).  01/21/19   [provider]  HYDROcodone-acetaminophen (NORCO/VICODIN) 5-325 MG tablet Take 1 tablet by mouth every 6 (six) hours as needed. 05/10/19   Ripley Fraise, MD  ibuprofen (ADVIL) 600 MG tablet Take 1 tablet (600 mg total) by mouth every 6 (six) hours as needed. 10/19/18   Law, Bea Graff, PA-C  Insulin Glargine, 1 Unit Dial, (TOUJEO SOLOSTAR) 300 UNIT/ML SOPN Inject 50-80 Units into the skin at bedtime. 50 units in the  morning and 80 units at bedtime    [provider]  ivabradine (CORLANOR) 5 MG TABS tablet Take 1 tablet (5 mg total) by mouth 2 (two) times daily with a meal. 06/06/19   [name], MD  levothyroxine (SYNTHROID) 150 MCG tablet Take 150 mcg by mouth daily. 01/14/19   [provider]  metFORMIN (GLUCOPHAGE) 500 MG tablet Take 500 mg by mouth daily.  02/18/19   [provider]  metoprolol tartrate (LOPRESSOR) 50 MG tablet Take 1 tablet (50 mg total) by mouth 2 (two) times daily. 04/08/19 07/07/19  Miquel Dunn, NP  NOVOLOG FLEXPEN 100 UNIT/ML FlexPen Inject 25 Units into the skin 2 (two) times daily with a meal. Every meal 04/19/19   [provider]  ondansetron (ZOFRAN) 4 MG tablet Take 4 mg by mouth as needed for nausea or vomiting.  12/19/18   [provider]  pregabalin (LYRICA) 300 MG capsule Take 300 mg by mouth 2 (two) times daily.     [provider]  spironolactone (ALDACTONE) 25 MG tablet Take 1 tablet (25 mg total) by mouth daily. 04/27/19 06/06/19  Miquel Dunn, NP    Allergies    Clarithromycin, Shellfish allergy, Penicillins, and Prednisone  Review of Systems   Review of Systems  Constitutional: Negative for appetite change, chills and fever.  HENT: Negative for ear pain, rhinorrhea, sneezing and sore throat.   Eyes: Negative for photophobia and visual disturbance.  Respiratory: Negative for cough, chest tightness, shortness of breath and wheezing.   Cardiovascular: Negative for chest pain and palpitations.  Gastrointestinal: Negative for abdominal pain, blood in stool, constipation, diarrhea, nausea and vomiting.  Genitourinary: Negative for dysuria, hematuria and urgency.  Musculoskeletal: Negative for myalgias.  Skin: Negative for rash.  Neurological: Positive for syncope. Negative for dizziness, weakness and light-headedness.    Physical Exam Updated Vital Signs BP (!) 147/93 (BP Location: Right Arm)    Pulse 100   Temp 98.2 F (36.8 C) (Oral)   Resp 16   SpO2 100%   Physical Exam Vitals and nursing note reviewed.  Constitutional:      General: [name] not in acute distress.    Appearance: [name] well-developed. [name] not diaphoretic.  HENT:     Head: Normocephalic and atraumatic.  Eyes:     General: No scleral icterus.    Conjunctiva/sclera: Conjunctivae normal.  Pulmonary:     Effort: Pulmonary effort is normal. No respiratory distress.  Musculoskeletal:     Cervical back: Normal range of motion.  Skin:    Findings: No rash.  Neurological:     Mental Status: [name] alert.     ED Results / Procedures / Treatments   Labs (all labs ordered are listed, but only  abnormal results are displayed) Labs Reviewed  BASIC METABOLIC PANEL - Abnormal; Notable for the following components:      Result Value   Sodium 128 (*)    Chloride 94 (*)    CO2 20 (*)    Glucose, Bld 667 (*)    BUN 29 (*)    Creatinine, Ser 1.11 (*)    All other components within normal limits  URINALYSIS, ROUTINE W REFLEX MICROSCOPIC - Abnormal; Notable for the following components:   Color, Urine STRAW (*)    Glucose, UA >=500 (*)    Bacteria, UA RARE (*)    All other components within normal limits  CBC  I-STAT BETA HCG BLOOD, ED ([facility], [facility], [facility] ONLY)  CBG MONITORING, ED    EKG None  Radiology No results found.  Procedures Procedures (including critical care time)  Medications Ordered in ED Medications  sodium chloride flush (NS) 0.9 % injection 3 mL (has no administration in time range)    ED Course  I have reviewed the triage vital signs and the nursing notes.  Pertinent labs & imaging results that were available during my care of the patient were reviewed by me and considered in my medical decision making (see chart for details).    MDM Rules/Calculators/A&P                      29 year old female with a past medical history of type 1 diabetes presenting to the ED for hyperglycemia.   Found to have sugars in the 600s, normal anion gap.  She was found unresponsive in her car just prior to arrival.  She states that her symptoms are due to her improper use of her insulin and that she will manage her symptoms at home.  [name] requesting discharge without further treatments or work-up.   I have discussed my concerns as a provider and the possibility that this may worsen. We discussed the nature, risks and benefits, and alternatives to treatment. I have specifically discussed that without further evaluation I cannot guarantee there is not a life threatening event occuring.  Time was given to allow the opportunity to ask questions and consider the options, and after the discussion, the patient decided to refuse the offered treatment. Patient is alert and oriented x4, their own POA and states understanding of my concerns and the possible consequences. After refusal, I made every reasonable opportunity to treat them to the best of my ability. I have made the patient aware that this is an [place] discharge, but he may return at any time for further evaluation and treatment.  Final Clinical Impression(s) / ED Diagnoses Final diagnoses:  Hyperglycemia due to type 1 diabetes mellitus ([place])  Syncope and collapse    Rx / DC Orders ED Discharge Orders    None      Portions of this note were generated with Dragon dictation software. Dictation errors may occur despite best attempts at proofreading.    Delia Heady, PA-C 06/06/19 2042    Lucrezia Starch, MD 06/12/19 [phone]

## 2019-06-10 ENCOUNTER — Other Ambulatory Visit: Payer: Self-pay

## 2019-06-10 DIAGNOSIS — E1365 Other specified diabetes mellitus with hyperglycemia: Secondary | ICD-10-CM | POA: Diagnosis not present

## 2019-06-10 DIAGNOSIS — E039 Hypothyroidism, unspecified: Secondary | ICD-10-CM | POA: Diagnosis not present

## 2019-06-10 DIAGNOSIS — E781 Pure hyperglyceridemia: Secondary | ICD-10-CM | POA: Diagnosis not present

## 2019-06-10 DIAGNOSIS — E786 Lipoprotein deficiency: Secondary | ICD-10-CM | POA: Diagnosis not present

## 2019-06-10 MED ORDER — METOPROLOL TARTRATE 50 MG PO TABS: 50.0000 mg | ORAL_TABLET | Freq: Two times a day (BID) | ORAL | 1 refills | Status: DC

## 2019-06-10 MED ORDER — ENTRESTO 24-26 MG PO TABS: 1.0000 | ORAL_TABLET | Freq: Two times a day (BID) | ORAL | 1 refills | Status: DC

## 2019-06-14 DIAGNOSIS — K760 Fatty (change of) liver, not elsewhere classified: Secondary | ICD-10-CM | POA: Diagnosis not present

## 2019-06-14 DIAGNOSIS — K6389 Other specified diseases of intestine: Secondary | ICD-10-CM | POA: Diagnosis not present

## 2019-06-14 DIAGNOSIS — Z79899 Other long term (current) drug therapy: Secondary | ICD-10-CM | POA: Diagnosis not present

## 2019-06-14 DIAGNOSIS — R110 Nausea: Secondary | ICD-10-CM | POA: Diagnosis not present

## 2019-06-14 DIAGNOSIS — R509 Fever, unspecified: Secondary | ICD-10-CM | POA: Diagnosis not present

## 2019-06-14 DIAGNOSIS — K219 Gastro-esophageal reflux disease without esophagitis: Secondary | ICD-10-CM | POA: Diagnosis not present

## 2019-06-14 DIAGNOSIS — R109 Unspecified abdominal pain: Secondary | ICD-10-CM | POA: Diagnosis not present

## 2019-06-14 DIAGNOSIS — Z883 Allergy status to other anti-infective agents status: Secondary | ICD-10-CM | POA: Diagnosis not present

## 2019-06-14 DIAGNOSIS — D509 Iron deficiency anemia, unspecified: Secondary | ICD-10-CM | POA: Diagnosis not present

## 2019-06-14 DIAGNOSIS — E079 Disorder of thyroid, unspecified: Secondary | ICD-10-CM | POA: Diagnosis not present

## 2019-06-14 DIAGNOSIS — I10 Essential (primary) hypertension: Secondary | ICD-10-CM | POA: Diagnosis not present

## 2019-06-14 DIAGNOSIS — Z880 Allergy status to penicillin: Secondary | ICD-10-CM | POA: Diagnosis not present

## 2019-06-14 DIAGNOSIS — R569 Unspecified convulsions: Secondary | ICD-10-CM | POA: Diagnosis not present

## 2019-06-14 DIAGNOSIS — Z794 Long term (current) use of insulin: Secondary | ICD-10-CM | POA: Diagnosis not present

## 2019-06-14 DIAGNOSIS — E1165 Type 2 diabetes mellitus with hyperglycemia: Secondary | ICD-10-CM | POA: Diagnosis not present

## 2019-06-17 ENCOUNTER — Ambulatory Visit: Payer: BC Managed Care – PPO | Admitting: Cardiology

## 2019-06-17 DIAGNOSIS — B373 Candidiasis of vulva and vagina: Secondary | ICD-10-CM | POA: Diagnosis not present

## 2019-06-17 DIAGNOSIS — K6389 Other specified diseases of intestine: Secondary | ICD-10-CM | POA: Diagnosis not present

## 2019-06-17 DIAGNOSIS — R739 Hyperglycemia, unspecified: Secondary | ICD-10-CM | POA: Diagnosis not present

## 2019-06-17 DIAGNOSIS — E1065 Type 1 diabetes mellitus with hyperglycemia: Secondary | ICD-10-CM | POA: Diagnosis not present

## 2019-06-19 DIAGNOSIS — E1029 Type 1 diabetes mellitus with other diabetic kidney complication: Secondary | ICD-10-CM | POA: Insufficient documentation

## 2019-06-19 DIAGNOSIS — E786 Lipoprotein deficiency: Secondary | ICD-10-CM | POA: Insufficient documentation

## 2019-07-19 NOTE — Progress Notes (Signed)
Primary Physician/Referring:  Sue Lush, PA-C  Patient ID: Jennifer [name], female    DOB: [birth year], 29 y.o.   MRN: [id number]  Chief Complaint  Patient presents with  . Congestive Heart Failure    entresto titration   HPI:    Jennifer [name]  is a 29 y.o. Caucasian female  with hypertension, type 1 diabetes with peripheral neuropathy,  astrocytoma s/p surgery in 1997 and chemotherapy with residual right ptosis and left hemiparesis , chronic pancreatitis, non ischemic cardiomyopathy with chronic systolic heart failure dating back to 2019, hypertension, and hypertriglycerdemia (has Lipoprotein lipase deficiency). Patient is a Special Ed. Teacher.   Presently on on maximum beta-blocker therapy, Corlanor and Aldactone. Was recently started on low dose Entresto, being closely watched for hyperkalemia and renal failure. Since being on entresto: She had felt markedly improved with regard to overall wellbeing and also palpitations.   She has had to the emergency room visits, one was 3 weeks ago after she had seen [place] and was found unresponsive in the car and found to be hyperglycemic.  She continues to have difficulty with diabetes. With regard to hypertriglyceridemia, she is presently on TriCor, previously did not tolerate Vascepa due to drug rash and did not respond to niacin.   Past Medical History:  Diagnosis Date  . Astrocytoma ([place])   . Brain tumor ([place]) 03/29/1995  . Cholesterosis   . Diabetes mellitus without complication ([place])   . DM (diabetes mellitus) ([place]) 10/10/2018  . Fatty liver   . HTN (hypertension) 10/10/2018  . Hypertension   . Hypothyroidism 10/10/2018  . Hypothyroidism   . Lipoprotein deficiency   . Lung disease    longevity long term  . Pancreatitis   . Polycystic ovary syndrome   . Thyroid disease    Past Surgical History:  Procedure Laterality Date  . ABDOMINAL SURGERY     pt states during miscarriage got her intestine  . BRAIN SURGERY    . EYE  MUSCLE SURGERY Right 03/28/2014  . VENTRICULOSTOMY  03/28/1997   Family History  Problem Relation Age of Onset  . Diabetes Mother   . Hypertension Mother   . Hyperlipidemia Mother   . Thyroid disease Mother   . Hypertension Father   . Diabetes Father     Social History   Tobacco Use  . Smoking status: Never Smoker  . Smokeless tobacco: Never Used  Substance Use Topics  . Alcohol use: Never   Marital Status: Married  ROS  Review of Systems  Cardiovascular: Positive for palpitations. Negative for chest pain and leg swelling.  Respiratory: Positive for shortness of breath.   Gastrointestinal: Negative for melena.  Neurological: Positive for numbness and paresthesias.   Objective  Blood pressure 140/82, pulse 99, temperature 98.7 F (37.1 C), height 4' 9"  (1.448 m), weight 129 lb (58.5 kg), SpO2 97 %.  Vitals with BMI 07/22/2019 06/06/2019 06/06/2019  Height 4' 9"  - -  Weight 129 lbs - -  BMI 38.46 - -  Systolic 659 935 701  Diastolic 82 93 91  Pulse 99 100 109     Physical Exam  Constitutional:  Short stature.  Scoliosis present.    Neck: No JVD present.  Cardiovascular: Normal rate, regular rhythm and intact distal pulses.  Pulmonary/Chest: Effort normal and breath sounds normal. No accessory muscle usage. No respiratory distress.  Abdominal: Soft. Bowel sounds are normal.  Musculoskeletal:        General: No edema.  Vitals reviewed.  Laboratory examination:  Recent Labs    05/09/19 1019 05/09/19 2020 06/06/19 1750  NA 129* 133* 128*  K 5.9* 4.1 4.6  CL 89* 98 94*  CO2 22 19* 20*  GLUCOSE 741* 132* 667*  BUN 34* 35* 29*  CREATININE 1.13* 1.10* 1.11*  CALCIUM 9.7 10.2 9.7  GFRNONAA 66 >60 >60  GFRAA 76 >60 >60   CrCl cannot be calculated (Patient's most recent lab result is older than the maximum 21 days allowed.).  CMP Latest Ref Rng & Units 06/06/2019 05/09/2019 05/09/2019  Glucose 70 - 99 mg/dL 667(HH) 132(H) 741(HH)  BUN 6 - 20 mg/dL 29(H) 35(H)  34(H)  Creatinine 0.44 - 1.00 mg/dL 1.11(H) 1.10(H) 1.13(H)  Sodium 135 - 145 mmol/L 128(L) 133(L) 129(L)  Potassium 3.5 - 5.1 mmol/L 4.6 4.1 5.9(H)  Chloride 98 - 111 mmol/L 94(L) 98 89(L)  CO2 22 - 32 mmol/L 20(L) 19(L) 22  Calcium 8.9 - 10.3 mg/dL 9.7 10.2 9.7  Total Protein 6.5 - 8.1 g/dL - 7.7 -  Total Bilirubin 0.3 - 1.2 mg/dL - 0.9 -  Alkaline Phos 38 - 126 U/L - 49 -  AST 15 - 41 U/L - 35 -  ALT 0 - 44 U/L - 35 -   CBC Latest Ref Rng & Units 06/06/2019 05/09/2019 08/05/2018  WBC 4.0 - 10.5 K/uL 5.2 7.0 -  Hemoglobin 12.0 - 15.0 g/dL 12.0 13.8 8.8(L)  Hematocrit 36.0 - 46.0 % 36.4 41.0 26.0(L)  Platelets 150 - 400 K/uL 183 150 -   Lipid Panel  No results found for: CHOL, TRIG, HDL, CHOLHDL, VLDL, LDLCALC, LDLDIRECT HEMOGLOBIN A1C No results found for: HGBA1C, MPG TSH No results for input(s): TSH in the last 8760 hours.   Medications and allergies   Allergies  Allergen Reactions  . Clarithromycin Other (See Comments)    Upset stomach   . Shellfish Allergy Swelling    Pt states contrast in CT is okay  . Penicillins Rash    Has patient had a PCN reaction causing immediate rash, facial/tongue/throat swelling, SOB or lightheadedness with hypotension: Y Has patient had a PCN reaction causing severe rash involving mucus membranes or skin necrosis: Y Has patient had a PCN reaction that required hospitalization: N Has patient had a PCN reaction occurring within the last 10 years: Y If all of the above answers are "NO", then may proceed with Cephalosporin use.   . Prednisone Rash     Current Outpatient Medications  Medication Instructions  . Acetyl L-Carnitine 500 mg, Oral, Daily  . albuterol (VENTOLIN HFA) 108 (90 Base) MCG/ACT inhaler 2 puffs, Inhalation, As needed  . ALPHA LIPOIC ACID-BIOTIN PO 200 mg, Oral, Daily  . baclofen (LIORESAL) 10 mg, Oral, Daily  . CRANBERRY FRUIT PO 1 capsule, Oral, 2 times daily  . Cyanocobalamin (VITAMIN B12 PO) 1 Dose, Oral, Daily  .  cyclobenzaprine (FLEXERIL) 10 mg, Oral, As needed, for muscle spams  . dicyclomine (BENTYL) 10 mg, Oral, 3 times daily PRN  . DULoxetine (CYMBALTA) 60 mg, Oral, Daily  . famotidine (PEPCID) 20 mg, Oral, 2 times daily  . fenofibrate (TRICOR) 145 mg, Oral, Daily  . fluticasone furoate-vilanterol (BREO ELLIPTA) 100-25 MCG/INH AEPB 1 puff, Inhalation, As needed  . HYDROcodone-acetaminophen (NORCO/VICODIN) 5-325 MG tablet 1 tablet, Oral, Every 6 hours PRN  . ibuprofen (ADVIL) 600 mg, Oral, Every 6 hours PRN  . ivabradine (CORLANOR) 5 mg, Oral, 2 times daily with meals  . levothyroxine (SYNTHROID) 150 mcg, Oral, Daily  . metoprolol tartrate (LOPRESSOR) 50  mg, Oral, 2 times daily  . norethindrone-ethinyl estradiol (OVCON-35) 0.4-35 MG-MCG tablet 1 tablet, Oral, Daily  . NovoLOG FlexPen 25 Units, Subcutaneous, 2 times daily with meals, Every meal  . ondansetron (ZOFRAN) 4 mg, Oral, As needed  . pregabalin (LYRICA) 300 mg, Oral, 2 times daily  . rifaximin (XIFAXAN) 200 mg, Oral, Daily  . rosuvastatin (CRESTOR) 40 mg, Oral, Daily  . spironolactone (ALDACTONE) 25 mg, Oral, Daily  . Toujeo SoloStar 80 Units, Subcutaneous, 2 times daily, 50 units in the morning and 80 units at bedtime   Radiology:   CT of the abdomen and pelvis 12/06/2018: 1. Dominant follicle in the left ovary with some gradient layering attenuation which could reflect a non ruptured hemorrhagic cyst. May be a pain generator in this patient. Could consider 6-12 week follow-up ultrasound to ensure resolution.   2. Diminutive appearance of the uterus which enhances avidly, a nonspecific finding. Morphology can be better evaluated at the time of follow-up ultrasound. 3. Age advanced noncalcified atheromatous plaque in the distal abdominal aorta just proximal to the bifurcation. 4. Geographic hypoattenuation in the posterior left lobe and caudate lobe of the liver most likely reflecting focal fatty infiltration. 5. Hyperenhancing focus in  the anterior right lobe liver could reflect flash filling hemangioma versus transient hepatic attenuation difference. If further determination is clinically warranted, outpatient MR with contrast could be obtained.  CT abdomen pelvis with contrast 05/09/2019: Age advanced noncalcified atheromatous plaque in the distal abdominal aorta just proximal to the bifurcation. No aneurysm or ectasia.   Cardiac Studies:   Cardiac MRI  08/04/2017:  1. The left ventricle is normal in size and has moderately decreased function. There is mid to basal inferoseptal hypokinesis. The right ventricle is normal in size and has mildly decreased function. 2. There is evidence of subepicardial enhancement of the mid inferoseptal wall segment, which is compatible with myocarditis. There is also a very small focal subendocardial enhancement in the mid inferoseptum which is compatible with infarction.  3. There is evidence of myocardial edema of the mid epicardial inferoseptum which is compatible with acute myocarditis. 4. Small circumferential pericardial effusion  Echocardiogram 04/16/2019:  Left ventricle cavity is normal in size. Mild concentric hypertrophy of  the left ventricle. Normal global wall motion. Moderately depressed LV  systolic function with EF 40%. Doppler evidence of grade I (impaired)  diastolic dysfunction, normal LAP. Calculated EF 40%.  Trileaflet aortic valve. Mild to moderate aortic regurgitation.  Moderate (Grade III) mitral regurgitation.  Moderate tricuspid regurgitation. Estimated pulmonary artery systolic  pressure is 30 mmHg.  Mild pulmonic regurgitation.  No significant change compared to previous study on 10/03/2018.  Lexiscan (Walking with Jaci Carrel) Sestamibi Stress Test 05/08/2019: Nondiagnostic ECG stress. Myocardial perfusion is normal. LV is normal in size both in rest and stress images. Stress LV EF:  calculated at 38% but visually normal. No previous exam available for  comparison. Low risk study.   EKG:  EKG 07/22/2019: Normal sinus rhythm with rate of 91 bpm, normal axis, no evidence of ischemia.  No significant change from EKG 04/29/2019   Assessment     ICD-10-CM   1. Chronic systolic (congestive) heart failure (HCC)  I50.22 EKG 12-Lead    sacubitril-valsartan (ENTRESTO) 49-51 mg per tablet  2. Essential hypertension  I10   3. Sinus tachycardia  R00.0   4. Stage 3a chronic kidney disease  N18.31     Meds ordered this encounter  Medications  . sacubitril-valsartan (ENTRESTO) 49-51 mg per tablet  Order Specific Question:   ACE-inhibitors have NOT been administered in the past 36-hours.    Answer:   YES (confirmed by ordering provider)    Medications Discontinued During This Encounter  Medication Reason  . metFORMIN (GLUCOPHAGE) 500 MG tablet Error  . sacubitril-valsartan (ENTRESTO) 24-26 mg per tablet   . sacubitril-valsartan (ENTRESTO) 24-26 MG Change in therapy    Recommendations:   Jennifer [name]  is a 29 y.o. Caucasian female  with hypertension, type 1 diabetes with peripheral neuropathy,  astrocytoma s/p surgery in 1997 and chemotherapy with residual right ptosis and left hemiparesis , chronic pancreatitis, non ischemic cardiomyopathy with chronic systolic heart failure dating back to 2019, hypertension, and hypertriglycerdemia (has Lipoprotein lipase deficiency). Patient is a Special Ed. Teacher.   Presently on on maximum beta-blocker therapy, Corlanor and Aldactone. Was recently started on low dose Entresto, being closely watched for hyperkalemia and renal failure. [name] now on Entresto, tolerating this well and feels well since being on the medication, increased dose to 49/51 mg BID. Still continue to have elevated HR in spite of being on maximum tolerated dose of BB and Corlanor. However no clinical CHF.   She needs repeat BMP in 10 days with next office visit, over the past 1 year she has developed stage III chronic kidney disease  related to diabetes mellitus.  She has had multiple ED visits for hyperglycemia, continues to have elevated blood sugar and is having difficulty in controlling her blood sugar.  I reviewed her labs, renal function has remained stable  [name], MD, [facility] 07/22/2019, 1:52 PM [facility] [place] Office: [phone]   CC: Hassan Buckler, MD; Tereasa Coop, PA-C

## 2019-07-22 ENCOUNTER — Ambulatory Visit: Payer: BC Managed Care – PPO | Admitting: Cardiology

## 2019-07-22 ENCOUNTER — Other Ambulatory Visit: Payer: Self-pay

## 2019-07-22 ENCOUNTER — Encounter: Payer: Self-pay | Admitting: Cardiology

## 2019-07-22 VITALS — BP 140/82 | HR 99 | Temp 98.7°F | Ht <= 58 in | Wt 129.0 lb

## 2019-07-22 DIAGNOSIS — N1831 Chronic kidney disease, stage 3a: Secondary | ICD-10-CM

## 2019-07-22 DIAGNOSIS — R000 Tachycardia, unspecified: Secondary | ICD-10-CM

## 2019-07-22 DIAGNOSIS — I10 Essential (primary) hypertension: Secondary | ICD-10-CM

## 2019-07-22 DIAGNOSIS — I5022 Chronic systolic (congestive) heart failure: Secondary | ICD-10-CM

## 2019-07-22 MED ORDER — SACUBITRIL-VALSARTAN 49-51 MG PO TABS: 1.0000 | ORAL_TABLET | Freq: Two times a day (BID) | ORAL | Status: AC

## 2019-08-01 ENCOUNTER — Telehealth: Payer: Self-pay

## 2019-08-01 DIAGNOSIS — R0789 Other chest pain: Secondary | ICD-10-CM

## 2019-08-01 MED ORDER — NITROGLYCERIN 0.4 MG SL SUBL: 0.4000 mg | SUBLINGUAL_TABLET | SUBLINGUAL | 3 refills | Status: DC | PRN

## 2019-08-01 NOTE — Telephone Encounter (Signed)
ICD-10-CM   1. Atypical chest pain  R07.89 nitroGLYCERIN (NITROSTAT) 0.4 MG SL tablet    [name], MD, [facility] 08/01/2019, 6:08 PM [facility]. [place] Office: [phone]

## 2019-08-01 NOTE — Telephone Encounter (Signed)
Patient is having chest pain that began last night, it feels different than chest pain she has experienced before. Pain starts dull then gets sharp. The first time lasting 10 minutes. The duration of pain is getting shorter. She has had 3 episodes total. She took and extra [facility] 24-26 today wants to know is that ok? Please respond through through mychart.

## 2019-08-10 NOTE — Progress Notes (Signed)
Primary Physician/Referring:  Sue Lush, PA-C  Patient ID: Jennifer [name], female    DOB: 1990-06-15, 29 y.o.   MRN: [id number]  Chief Complaint  Patient presents with  . Cardiomyopathy  . Follow-up    2 week   HPI:    Jennifer [name]  is a 29 y.o. Caucasian female  with hypertension, type 1 diabetes with peripheral neuropathy,  astrocytoma s/p surgery in 1997 and chemotherapy with residual right ptosis and left hemiparesis , chronic pancreatitis, non ischemic cardiomyopathy with chronic systolic heart failure dating back to 2019, hypertension, and hypertriglycerdemia (has Lipoprotein lipase deficiency). Patient is a Special Ed. Teacher.   Presently on on maximum beta-blocker therapy, Corlanor and Aldactone.  [name] now on moderate dose of Entresto which she is tolerating.  She underwent labs with the PCP and presents for follow-up.  She has gained a few pounds in weight but denies any new symptoms except for mild chronic dyspnea, no edema, no PND or orthopnea.  Past Medical History:  Diagnosis Date  . Astrocytoma ([place])   . Brain tumor ([place]) 03/29/1995  . Cholesterosis   . Diabetes mellitus without complication ([place])   . DM (diabetes mellitus) ([place]) 10/10/2018  . Fatty liver   . HTN (hypertension) 10/10/2018  . Hypertension   . Hypothyroidism 10/10/2018  . Hypothyroidism   . Lipoprotein deficiency   . Lung disease    longevity long term  . Pancreatitis   . Polycystic ovary syndrome   . Thyroid disease    Past Surgical History:  Procedure Laterality Date  . ABDOMINAL SURGERY     pt states during miscarriage got her intestine  . BRAIN SURGERY    . EYE MUSCLE SURGERY Right 03/28/2014  . VENTRICULOSTOMY  03/28/1997   Family History  Problem Relation Age of Onset  . Diabetes Mother   . Hypertension Mother   . Hyperlipidemia Mother   . Thyroid disease Mother   . Hypertension Father   . Diabetes Father     Social History   Tobacco Use  . Smoking status: Never  Smoker  . Smokeless tobacco: Never Used  Substance Use Topics  . Alcohol use: Never   Marital Status: Married  ROS  Review of Systems  Constitution: Positive for weight loss.  Cardiovascular: Positive for dyspnea on exertion and palpitations. Negative for chest pain and leg swelling.  Gastrointestinal: Negative for melena.  Neurological: Positive for numbness and paresthesias.   Objective  Blood pressure (!) 144/91, pulse (!) 116, temperature 98.5 F (36.9 C), temperature source Temporal, resp. rate 16, height _0  (1.448 m), weight 134 lb 4.8 oz (60.9 kg), SpO2 98 %.  Vitals with BMI 08/12/2019 07/22/2019 06/06/2019  Height _1  _2  -  Weight 134 lbs 5 oz 129 lbs -  BMI 73.53 29.92 -  Systolic 426 834 196  Diastolic 91 82 93  Pulse 222 99 100     Physical Exam  Constitutional:  Short stature.  Scoliosis present.    Neck: No JVD present.  Cardiovascular: Normal rate, regular rhythm and intact distal pulses.  Pulmonary/Chest: Effort normal and breath sounds normal. No accessory muscle usage. No respiratory distress.  Abdominal: Soft. Bowel sounds are normal.  obese  Musculoskeletal:        General: No edema.  Vitals reviewed.  Laboratory examination:   Recent Labs    05/09/19 1019 05/09/19 2020 06/06/19 1750  NA 129* 133* 128*  K 5.9* 4.1 4.6  CL 89* 98 94*  CO2 22 19* 20*  GLUCOSE 741* 132* 667*  BUN 34* 35* 29*  CREATININE 1.13* 1.10* 1.11*  CALCIUM 9.7 10.2 9.7  GFRNONAA 66 >60 >60  GFRAA 76 >60 >60   CrCl cannot be calculated (Patient's most recent lab result is older than the maximum 21 days allowed.).  CMP Latest Ref Rng & Units 06/06/2019 05/09/2019 05/09/2019  Glucose 70 - 99 mg/dL 667(HH) 132(H) 741(HH)  BUN 6 - 20 mg/dL 29(H) 35(H) 34(H)  Creatinine 0.44 - 1.00 mg/dL 1.11(H) 1.10(H) 1.13(H)  Sodium 135 - 145 mmol/L 128(L) 133(L) 129(L)  Potassium 3.5 - 5.1 mmol/L 4.6 4.1 5.9(H)  Chloride 98 - 111 mmol/L 94(L) 98 89(L)  CO2 22 - 32 mmol/L 20(L)  19(L) 22  Calcium 8.9 - 10.3 mg/dL 9.7 10.2 9.7  Total Protein 6.5 - 8.1 g/dL - 7.7 -  Total Bilirubin 0.3 - 1.2 mg/dL - 0.9 -  Alkaline Phos 38 - 126 U/L - 49 -  AST 15 - 41 U/L - 35 -  ALT 0 - 44 U/L - 35 -   CBC Latest Ref Rng & Units 06/06/2019 05/09/2019 08/05/2018  WBC 4.0 - 10.5 K/uL 5.2 7.0 -  Hemoglobin 12.0 - 15.0 g/dL 12.0 13.8 8.8(L)  Hematocrit 36.0 - 46.0 % 36.4 41.0 26.0(L)  Platelets 150 - 400 K/uL 183 150 -   External Labs:  Lab 07/29/2019: Serum glucose 165 mg, BUN 27, creatinine 1.02, potassium 4.4, EGFR 75 mL.  AST minimally elevated at 52.  Total cholesterol 490, triglycerides 1948, HDL 21, LDL not calculated.  A1c 9.5%.  Lipid Panel 08/06/2018 Cholesterol, total 1859.000 01/07/2019 Triglycerides 866.000 02/18/2019 HDL 8.000 MG/ 01/07/2019 LDL 107.000 02/18/2019  A1C 12.700 09/24/2018  TSH 1.720 02/18/2019  Medications and allergies   Allergies  Allergen Reactions  . Clarithromycin Other (See Comments)    Upset stomach   . Shellfish Allergy Swelling    Pt states contrast in CT is okay  . Penicillins Rash    Has patient had a PCN reaction causing immediate rash, facial/tongue/throat swelling, SOB or lightheadedness with hypotension: Y Has patient had a PCN reaction causing severe rash involving mucus membranes or skin necrosis: Y Has patient had a PCN reaction that required hospitalization: N Has patient had a PCN reaction occurring within the last 10 years: Y If all of the above answers are "NO", then may proceed with Cephalosporin use.   . Prednisone Rash     Current Outpatient Medications  Medication Instructions  . Acetyl L-Carnitine 500 mg, Oral, Daily  . albuterol (VENTOLIN HFA) 108 (90 Base) MCG/ACT inhaler 2 puffs, Inhalation, As needed  . ALPHA LIPOIC ACID-BIOTIN PO 200 mg, Oral, Daily  . baclofen (LIORESAL) 10 mg, Oral, Daily  . CRANBERRY FRUIT PO 1 capsule, Oral, 2 times daily  . Cyanocobalamin (VITAMIN B12 PO) 1 Dose, Oral, Daily    . cyclobenzaprine (FLEXERIL) 10 mg, Oral, As needed, for muscle spams  . dicyclomine (BENTYL) 10 mg, Oral, 3 times daily PRN  . DULoxetine (CYMBALTA) 60 mg, Oral, Daily  . famotidine (PEPCID) 20 mg, Oral, 2 times daily  . fenofibrate (TRICOR) 145 mg, Oral, Daily  . fluticasone furoate-vilanterol (BREO ELLIPTA) 100-25 MCG/INH AEPB 1 puff, Inhalation, As needed  . HYDROcodone-acetaminophen (NORCO/VICODIN) 5-325 MG tablet 1 tablet, Oral, Every 6 hours PRN  . ibuprofen (ADVIL) 600 mg, Oral, Every 6 hours PRN  . levothyroxine (SYNTHROID) 150 mcg, Oral, Daily  . metoprolol tartrate (LOPRESSOR) 100 mg, Oral, 2 times daily  . nitroGLYCERIN (  NITROSTAT) 0.4 mg, Sublingual, Every 5 min PRN  . norethindrone-ethinyl estradiol (OVCON-35) 0.4-35 MG-MCG tablet 1 tablet, Oral, Daily  . NovoLOG FlexPen 25 Units, Subcutaneous, 2 times daily with meals, Every meal  . ondansetron (ZOFRAN) 4 mg, Oral, As needed  . pregabalin (LYRICA) 300 mg, Oral, 2 times daily  . rifaximin (XIFAXAN) 200 mg, Oral, Daily  . rosuvastatin (CRESTOR) 40 mg, Oral, Daily  . sacubitril-valsartan (ENTRESTO) 97-103 MG 1 tablet, Oral, 2 times daily  . spironolactone (ALDACTONE) 25 mg, Oral, Daily  . Toujeo SoloStar 80 Units, Subcutaneous, 2 times daily, 50 units in the morning and 80 units at bedtime   Radiology:   CT of the abdomen and pelvis 12/06/2018: 1. Dominant follicle in the left ovary with some gradient layering attenuation which could reflect a non ruptured hemorrhagic cyst. May be a pain generator in this patient. Could consider 6-12 week follow-up ultrasound to ensure resolution.   2. Diminutive appearance of the uterus which enhances avidly, a nonspecific finding. Morphology can be better evaluated at the time of follow-up ultrasound. 3. Age advanced noncalcified atheromatous plaque in the distal abdominal aorta just proximal to the bifurcation. 4. Geographic hypoattenuation in the posterior left lobe and caudate lobe of  the liver most likely reflecting focal fatty infiltration. 5. Hyperenhancing focus in the anterior right lobe liver could reflect flash filling hemangioma versus transient hepatic attenuation difference. If further determination is clinically warranted, outpatient MR with contrast could be obtained.  CT abdomen pelvis with contrast 05/09/2019: Age advanced noncalcified atheromatous plaque in the distal abdominal aorta just proximal to the bifurcation. No aneurysm or ectasia.   Cardiac Studies:   Cardiac MRI  08/04/2017:  1. The left ventricle is normal in size and has moderately decreased function. There is mid to basal inferoseptal hypokinesis. The right ventricle is normal in size and has mildly decreased function. 2. There is evidence of subepicardial enhancement of the mid inferoseptal wall segment, which is compatible with myocarditis. There is also a very small focal subendocardial enhancement in the mid inferoseptum which is compatible with infarction.  3. There is evidence of myocardial edema of the mid epicardial inferoseptum which is compatible with acute myocarditis. 4. Small circumferential pericardial effusion  Echocardiogram 04/16/2019:  Left ventricle cavity is normal in size. Mild concentric hypertrophy of  the left ventricle. Normal global wall motion. Moderately depressed LV  systolic function with EF 40%. Doppler evidence of grade I (impaired)  diastolic dysfunction, normal LAP. Calculated EF 40%.  Trileaflet aortic valve. Mild to moderate aortic regurgitation.  Moderate (Grade III) mitral regurgitation.  Moderate tricuspid regurgitation. Estimated pulmonary artery systolic  pressure is 30 mmHg.  Mild pulmonic regurgitation.  No significant change compared to previous study on 10/03/2018.  Lexiscan (Walking with Jaci Carrel) Sestamibi Stress Test 05/08/2019: Nondiagnostic ECG stress. Myocardial perfusion is normal. LV is normal in size both in rest and stress images. Stress  LV EF:  calculated at 38% but visually normal. No previous exam available for comparison. Low risk study.    EKG:  07/22/2019: Normal sinus rhythm with rate of 91 bpm, normal axis, no evidence of ischemia.  No significant change from EKG 04/29/2019   Assessment     ICD-10-CM   1. Chronic systolic (congestive) heart failure (HCC)  I50.22 metoprolol tartrate (LOPRESSOR) 100 MG tablet    sacubitril-valsartan (ENTRESTO) 97-103 MG  2. Essential hypertension  I10 metoprolol tartrate (LOPRESSOR) 100 MG tablet    Basic metabolic panel  3. Sinus tachycardia  R00.0  4. Stage 3a chronic kidney disease  N18.31     Meds ordered this encounter  Medications  . metoprolol tartrate (LOPRESSOR) 100 MG tablet    Sig: Take 1 tablet (100 mg total) by mouth 2 (two) times daily.    Dispense:  180 tablet    Refill:  1  . sacubitril-valsartan (ENTRESTO) 97-103 MG    Sig: Take 1 tablet by mouth 2 (two) times daily.    Dispense:  180 tablet    Refill:  1    Medications Discontinued During This Encounter  Medication Reason  . ivabradine (CORLANOR) 5 MG TABS tablet Ineffective  . metoprolol tartrate (LOPRESSOR) 50 MG tablet     Recommendations:   Jennifer [name]  is a 29 y.o.  Caucasian female  with hypertension, type 1 diabetes with peripheral neuropathy,  astrocytoma s/p surgery in 1997 and chemotherapy with residual right ptosis and left hemiparesis , chronic pancreatitis, non ischemic cardiomyopathy with chronic systolic heart failure dating back to 2019, hypertension, and hypertriglycerdemia (has Lipoprotein lipase deficiency). Patient is a Special Ed. Teacher.   Presently on on maximum beta-blocker therapy, Corlanor and Aldactone.  [name] now on moderate dose of Entresto which she is tolerating.  I will increase the dose of metoprolol from 50 mg twice daily to 100 mg p.o. twice daily, will also increase Entresto from moderate dose to high dose and repeat BMP.  I reviewed her labs from PCP,  suspect her weight gain is related to uncontrolled diabetes and triglycerides have risen again.  Lipids are being managed [facility] at [facility].  Like to see him back in 1 month for follow-up.  I do not see any clinical evidence of congestive heart failure.  In view of sustained elevated heart rate,: Oral has been ineffective.  I will discontinue this.  [name], MD, [facility] 08/12/2019, 12:15 PM [facility] Pager: [phone] Office: [phone]

## 2019-08-12 ENCOUNTER — Other Ambulatory Visit: Payer: Self-pay

## 2019-08-12 ENCOUNTER — Encounter: Payer: Self-pay | Admitting: Cardiology

## 2019-08-12 ENCOUNTER — Ambulatory Visit: Payer: BC Managed Care – PPO | Admitting: Cardiology

## 2019-08-12 VITALS — BP 144/91 | HR 116 | Temp 98.5°F | Resp 16 | Ht <= 58 in | Wt 134.3 lb

## 2019-08-12 DIAGNOSIS — E875 Hyperkalemia: Secondary | ICD-10-CM

## 2019-08-12 DIAGNOSIS — N1831 Chronic kidney disease, stage 3a: Secondary | ICD-10-CM

## 2019-08-12 DIAGNOSIS — R000 Tachycardia, unspecified: Secondary | ICD-10-CM

## 2019-08-12 DIAGNOSIS — I10 Essential (primary) hypertension: Secondary | ICD-10-CM

## 2019-08-12 DIAGNOSIS — I5022 Chronic systolic (congestive) heart failure: Secondary | ICD-10-CM

## 2019-08-12 MED ORDER — SACUBITRIL-VALSARTAN 97-103 MG PO TABS: 1.0000 | ORAL_TABLET | Freq: Two times a day (BID) | ORAL | 1 refills | Status: DC

## 2019-08-12 MED ORDER — METOPROLOL TARTRATE 100 MG PO TABS: 100.0000 mg | ORAL_TABLET | Freq: Two times a day (BID) | ORAL | 1 refills | Status: DC

## 2019-08-16 DIAGNOSIS — I340 Nonrheumatic mitral (valve) insufficiency: Secondary | ICD-10-CM | POA: Insufficient documentation

## 2019-08-16 DIAGNOSIS — I351 Nonrheumatic aortic (valve) insufficiency: Secondary | ICD-10-CM | POA: Insufficient documentation

## 2019-08-23 LAB — BASIC METABOLIC PANEL
BUN/Creatinine Ratio: 40 — ABNORMAL HIGH (ref 9–23)
BUN: 49 mg/dL — ABNORMAL HIGH (ref 6–20)
CO2: 19 mmol/L — ABNORMAL LOW (ref 20–29)
Calcium: 9.9 mg/dL (ref 8.7–10.2)
Chloride: 100 mmol/L (ref 96–106)
Creatinine, Ser: 1.24 mg/dL — ABNORMAL HIGH (ref 0.57–1.00)
GFR calc Af Amer: 68 mL/min/{1.73_m2} (ref >59)
GFR calc non Af Amer: 59 mL/min/{1.73_m2} — ABNORMAL LOW (ref >59)
Glucose: 308 mg/dL — ABNORMAL HIGH (ref 65–99)
Potassium: 5.9 mmol/L — ABNORMAL HIGH (ref 3.5–5.2)
Sodium: 137 mmol/L (ref 134–144)

## 2019-08-23 NOTE — Addendum Note (Signed)
Addended by: Kela Millin on: 08/23/2019 06:46 PM   Modules accepted: Orders

## 2019-08-23 NOTE — Progress Notes (Signed)
Since increasing Entresto to maximum dose, she now has developed hyperkalemia.  I discussed with the patient personally over the telephone and also her husband left a message on his voicemail regarding discontinuing spironolactone, we will repeat BMP in 1 week.  Jennifer [name] voiced understanding.

## 2019-09-16 ENCOUNTER — Ambulatory Visit: Payer: BC Managed Care – PPO | Admitting: Cardiology

## 2020-01-30 DIAGNOSIS — E282 Polycystic ovarian syndrome: Secondary | ICD-10-CM | POA: Insufficient documentation

## 2020-04-18 ENCOUNTER — Emergency Department (HOSPITAL_COMMUNITY)
Admission: EM | Admit: 2020-04-18 | Discharge: 2020-04-18 | Disposition: A | Discharge status: Home / Self Care | Payer: BC Managed Care – PPO | Attending: Emergency Medicine | Admitting: Emergency Medicine

## 2020-04-18 ENCOUNTER — Emergency Department (HOSPITAL_COMMUNITY): Payer: BC Managed Care – PPO

## 2020-04-18 DIAGNOSIS — E039 Hypothyroidism, unspecified: Secondary | ICD-10-CM | POA: Diagnosis not present

## 2020-04-18 DIAGNOSIS — E1165 Type 2 diabetes mellitus with hyperglycemia: Secondary | ICD-10-CM | POA: Diagnosis not present

## 2020-04-18 DIAGNOSIS — Z794 Long term (current) use of insulin: Secondary | ICD-10-CM | POA: Diagnosis not present

## 2020-04-18 DIAGNOSIS — R1011 Right upper quadrant pain: Secondary | ICD-10-CM

## 2020-04-18 DIAGNOSIS — R739 Hyperglycemia, unspecified: Secondary | ICD-10-CM

## 2020-04-18 DIAGNOSIS — E871 Hypo-osmolality and hyponatremia: Secondary | ICD-10-CM | POA: Diagnosis not present

## 2020-04-18 DIAGNOSIS — Z79899 Other long term (current) drug therapy: Secondary | ICD-10-CM | POA: Diagnosis not present

## 2020-04-18 DIAGNOSIS — I10 Essential (primary) hypertension: Secondary | ICD-10-CM | POA: Diagnosis not present

## 2020-04-18 DIAGNOSIS — R7309 Other abnormal glucose: Secondary | ICD-10-CM

## 2020-04-18 LAB — CBC WITH DIFFERENTIAL/PLATELET
Abs Immature Granulocytes: 0.02 10*3/uL (ref 0.00–0.07)
Basophils Absolute: 0 10*3/uL (ref 0.0–0.1)
Basophils Relative: 0 %
Eosinophils Absolute: 0.1 10*3/uL (ref 0.0–0.5)
Eosinophils Relative: 2 %
HCT: 35.2 % — ABNORMAL LOW (ref 36.0–46.0)
Hemoglobin: 12.2 g/dL (ref 12.0–15.0)
Immature Granulocytes: 0 %
Lymphocytes Relative: 17 %
Lymphs Abs: 1.3 10*3/uL (ref 0.7–4.0)
MCH: 27.9 pg (ref 26.0–34.0)
MCHC: 34.7 g/dL (ref 30.0–36.0)
MCV: 80.5 fL (ref 80.0–100.0)
Monocytes Absolute: 0.5 10*3/uL (ref 0.1–1.0)
Monocytes Relative: 6 %
Neutro Abs: 5.7 10*3/uL (ref 1.7–7.7)
Neutrophils Relative %: 75 %
Platelets: 168 10*3/uL (ref 150–400)
RBC: 4.37 MIL/uL (ref 3.87–5.11)
RDW: 14.6 % (ref 11.5–15.5)
WBC: 7.7 10*3/uL (ref 4.0–10.5)
nRBC: 0 % (ref 0.0–0.2)

## 2020-04-18 LAB — COMPREHENSIVE METABOLIC PANEL
ALT: 25 U/L (ref 0–44)
AST: 22 U/L (ref 15–41)
Albumin: 3.9 g/dL (ref 3.5–5.0)
Alkaline Phosphatase: 48 U/L (ref 38–126)
Anion gap: 5 (ref 5–15)
BUN: 28 mg/dL — ABNORMAL HIGH (ref 6–20)
CO2: 29 mmol/L (ref 22–32)
Calcium: 8.9 mg/dL (ref 8.9–10.3)
Chloride: 98 mmol/L (ref 98–111)
Creatinine, Ser: 1.06 mg/dL — ABNORMAL HIGH (ref 0.44–1.00)
GFR, Estimated: >60 mL/min (ref >60)
Glucose, Bld: 379 mg/dL — ABNORMAL HIGH (ref 70–99)
Potassium: 3.5 mmol/L (ref 3.5–5.1)
Sodium: 132 mmol/L — ABNORMAL LOW (ref 135–145)
Total Bilirubin: 0.7 mg/dL (ref 0.3–1.2)
Total Protein: 6.7 g/dL (ref 6.5–8.1)

## 2020-04-18 LAB — I-STAT BETA HCG BLOOD, ED (MC, WL, AP ONLY): I-stat hCG, quantitative: <5 m[IU]/mL (ref <5)

## 2020-04-18 LAB — URINALYSIS, ROUTINE W REFLEX MICROSCOPIC
Bacteria, UA: NONE SEEN
Bilirubin Urine: NEGATIVE
Glucose, UA: >=500 mg/dL — AB
Hgb urine dipstick: NEGATIVE
Ketones, ur: NEGATIVE mg/dL
Leukocytes,Ua: NEGATIVE
Nitrite: NEGATIVE
Protein, ur: 30 mg/dL — AB
Specific Gravity, Urine: 1.029 (ref 1.005–1.030)
pH: 5 (ref 5.0–8.0)

## 2020-04-18 LAB — LIPASE, BLOOD: Lipase: 19 U/L (ref 11–51)

## 2020-04-18 MED ORDER — MORPHINE SULFATE (PF) 4 MG/ML IV SOLN
4.0000 mg | Freq: Once | INTRAVENOUS | Status: AC
  Administered 2020-04-18: 4 mg via INTRAVENOUS
  Filled 2020-04-18: qty 1

## 2020-04-18 MED ORDER — LACTATED RINGERS IV BOLUS
1000.0000 mL | Freq: Once | INTRAVENOUS | Status: AC
  Administered 2020-04-18: 1000 mL via INTRAVENOUS

## 2020-04-18 MED ORDER — DIPHENHYDRAMINE HCL 25 MG PO CAPS
25.0000 mg | ORAL_CAPSULE | Freq: Once | ORAL | Status: AC
  Administered 2020-04-18: 25 mg via ORAL
  Filled 2020-04-18: qty 1

## 2020-04-18 MED ORDER — IOHEXOL 300 MG/ML  SOLN
100.0000 mL | Freq: Once | INTRAMUSCULAR | Status: AC | PRN
  Administered 2020-04-18: 100 mL via INTRAVENOUS

## 2020-04-18 MED ORDER — PROCHLORPERAZINE EDISYLATE 10 MG/2ML IJ SOLN
10.0000 mg | Freq: Once | INTRAMUSCULAR | Status: AC
  Administered 2020-04-18: 10 mg via INTRAVENOUS
  Filled 2020-04-18: qty 2

## 2020-04-18 MED ORDER — PROCHLORPERAZINE MALEATE 10 MG PO TABS: 10.0000 mg | ORAL_TABLET | Freq: Two times a day (BID) | ORAL | 0 refills | Status: DC | PRN

## 2020-04-18 MED ORDER — ONDANSETRON HCL 4 MG/2ML IJ SOLN
4.0000 mg | Freq: Once | INTRAMUSCULAR | Status: AC
  Administered 2020-04-18: 4 mg via INTRAVENOUS
  Filled 2020-04-18: qty 2

## 2020-04-18 MED ORDER — MORPHINE SULFATE (PF) 4 MG/ML IV SOLN
4.0000 mg | Freq: Once | INTRAVENOUS | Status: AC
  Administered 2020-04-18: 4 mg via INTRAVENOUS
  Filled 2020-04-18 (×2): qty 1

## 2020-04-18 NOTE — ED Provider Notes (Signed)
Patient received in sign out from Dr. Roxanne Mins 30 yo female reports history of pancreatitis presents today ruq pain, reports vomiting, t1dm, here vss. Labs and ruq [place] essentially normal except glucose c.w. dm Physical Exam  BP 101/81   Pulse (!) 101   Temp 97.9 F (36.6 C) (Oral)   Resp 18   Ht 1.448 m (4' 9" )   Wt 52.6 kg   SpO2 99%   BMI 25.10 kg/m   Physical Exam  ED Course/Procedures     Procedures  MDM  Patient treated with fluid and morphine, odansetron, and compazine and continues to have pain. Abdominal pain-CT pending Hyperglycemia- Fluids ordered Will reevaluate  9:59 AM Reviewed CT scan does not show any evidence of acute abnormalities. Reevaluated patient.  She has taken p.o. without difficulty and feels improved.  She states she only has Zofran at home for antiemetics.  She will be given prescription for Compazine. We have discussed return precautions and need for follow-up and she voices understanding.     Pattricia Boss, MD 04/18/20 [phone]

## 2020-04-18 NOTE — ED Provider Notes (Signed)
[facility] DEPT Provider Note   CSN: [id number] Arrival date & time: 04/18/20  0207   History Chief Complaint  Patient presents with  . Abdominal Pain    Jennifer [name] is a 30 y.o. female.  The history is provided by the patient.  Abdominal Pain She has history of pancreatitis as well as diabetes, hypertension, polycystic ovarian syndrome and comes in because of severe right upper quadrant pain which started just after eating dinner at about 6 PM.  She states that she had eaten chicken and rice at a restaurant.  There is associated nausea and vomiting.  Pain was as severe as 10/10.  She came in by ambulance where she did receive something for pain and pain is down to 8/10.  Pain does not radiate to the back, chest, shoulder.  She denies fever or chills.  She has not had any diarrhea.  She denies any alcohol consumption.  She states that pancreatitis has been blamed on hyperlipidemia.  Past Medical History:  Diagnosis Date  . Astrocytoma ([place])   . Brain tumor ([place]) 03/29/1995  . Cholesterosis   . Diabetes mellitus without complication ([place])   . DM (diabetes mellitus) ([place]) 10/10/2018  . Fatty liver   . HTN (hypertension) 10/10/2018  . Hypertension   . Hypothyroidism 10/10/2018  . Hypothyroidism   . Lipoprotein deficiency   . Lung disease    longevity long term  . Pancreatitis   . Polycystic ovary syndrome   . Thyroid disease     Patient Active Problem List   Diagnosis Date Noted  . DM (diabetes mellitus) ([place]) 10/10/2018  . Hypothyroidism 10/10/2018    Past Surgical History:  Procedure Laterality Date  . ABDOMINAL SURGERY     pt states during miscarriage got her intestine  . BRAIN SURGERY    . EYE MUSCLE SURGERY Right 03/28/2014  . VENTRICULOSTOMY  03/28/1997     OB History   No obstetric history on file.     Family History  Problem Relation Age of Onset  . Diabetes Mother   . Hypertension Mother   . Hyperlipidemia Mother    . Thyroid disease Mother   . Hypertension Father   . Diabetes Father     Social History   Tobacco Use  . Smoking status: Never Smoker  . Smokeless tobacco: Never Used  Vaping Use  . Vaping Use: Never used  Substance Use Topics  . Alcohol use: Never  . Drug use: Never    Home Medications Prior to Admission medications   Medication Sig Start Date End Date Taking? Authorizing Provider  Acetylcarnitine HCl (ACETYL L-CARNITINE) 500 MG CAPS Take 500 mg by mouth daily.    [provider]  albuterol (VENTOLIN HFA) 108 (90 Base) MCG/ACT inhaler Inhale 2 puffs into the lungs as needed for wheezing or shortness of breath.  12/31/18   [provider]  ALPHA LIPOIC ACID-BIOTIN PO Take 200 mg by mouth daily.     [provider]  CRANBERRY FRUIT PO Take 1 capsule by mouth 2 (two) times a day.    [provider]  Cyanocobalamin (VITAMIN B12 PO) Take 1 Dose by mouth daily.    [provider]  cyclobenzaprine (FLEXERIL) 10 MG tablet Take 10 mg by mouth as needed. for muscle spams 11/19/18   [provider]  dicyclomine (BENTYL) 10 MG capsule Take 10 mg by mouth 3 (three) times daily as needed for spasms.  02/11/19   [provider]  DULoxetine (CYMBALTA) 60 MG capsule Take 60 mg by mouth daily.  01/28/19 07/22/19  [provider]  famotidine (PEPCID) 20 MG tablet Take 20 mg by mouth 2 (two) times daily.  02/11/19   [provider]  fenofibrate (TRICOR) 145 MG tablet Take 145 mg by mouth daily. 01/14/19   [provider]  fluticasone furoate-vilanterol (BREO ELLIPTA) 100-25 MCG/INH AEPB Inhale 1 puff into the lungs as needed (shortness of breath).  01/21/19   [provider]  HYDROcodone-acetaminophen (NORCO/VICODIN) 5-325 MG tablet Take 1 tablet by mouth every 6 (six) hours as needed. 05/10/19   Ripley Fraise, MD  ibuprofen (ADVIL) 600 MG tablet Take 1 tablet (600 mg total) by mouth every 6 (six) hours as  needed. 10/19/18   Law, Bea Graff, PA-C  Insulin Glargine, 1 Unit Dial, (TOUJEO SOLOSTAR) 300 UNIT/ML SOPN Inject 80 Units into the skin in the morning and at bedtime. 50 units in the morning and 80 units at bedtime    [provider]  levothyroxine (SYNTHROID) 150 MCG tablet Take 150 mcg by mouth daily. 01/14/19   [provider]  metoprolol tartrate (LOPRESSOR) 100 MG tablet Take 1 tablet (100 mg total) by mouth 2 (two) times daily. 08/12/19 02/08/20  [name], MD  nitroGLYCERIN (NITROSTAT) 0.4 MG SL tablet Place 1 tablet (0.4 mg total) under the tongue every 5 (five) minutes as needed for up to 25 days for chest pain. 08/01/19 08/26/19  [name], MD  norethindrone-ethinyl estradiol (OVCON-35) 0.4-35 MG-MCG tablet Take 1 tablet by mouth daily.    [provider]  NOVOLOG FLEXPEN 100 UNIT/ML FlexPen Inject 25 Units into the skin 2 (two) times daily with a meal. Every meal 04/19/19   [provider]  ondansetron (ZOFRAN) 4 MG tablet Take 4 mg by mouth as needed for nausea or vomiting.  12/19/18   [provider]  pregabalin (LYRICA) 300 MG capsule Take 300 mg by mouth 2 (two) times daily.     [provider]  rifaximin (XIFAXAN) 200 MG tablet Take 200 mg by mouth daily.    [provider]  rosuvastatin (CRESTOR) 40 MG tablet Take 40 mg by mouth daily.    [provider]  sacubitril-valsartan (ENTRESTO) 97-103 MG Take 1 tablet by mouth 2 (two) times daily. 08/12/19   [name], MD    Allergies    Clarithromycin, Shellfish allergy, Penicillins, and Prednisone  Review of Systems   Review of Systems  Gastrointestinal: Positive for abdominal pain.  All other systems reviewed and are negative.   Physical Exam Updated Vital Signs BP (!) 142/90 (BP Location: Right Arm)   Pulse (!) 118   Temp 97.9 F (36.6 C) (Oral)   Resp 18   Ht 4' 9"  (1.448 m)   Wt 52.6 kg   SpO2 97%   BMI 25.10 kg/m   Physical Exam Vitals and  nursing note reviewed.   30 year old female, resting comfortably and in no acute distress. Vital signs are significant for mildly elevated blood pressure and moderately increased heart rate. Oxygen saturation is 97%, which is normal. Head is normocephalic and atraumatic. PERRLA, EOMI. Oropharynx is clear. Neck is nontender and supple without adenopathy or JVD. Back is nontender and there is no CVA tenderness. Lungs are clear without rales, wheezes, or rhonchi. Chest is nontender. Heart has regular rate and rhythm without murmur. Abdomen is soft, flat, with fairly well localized tenderness in the right upper quadrant.  There is no rebound or  guarding.  There are no masses or hepatosplenomegaly and peristalsis is hypoactive. Extremities have no cyanosis or edema, full range of motion is present. Skin is warm and dry without rash. Neurologic: Mental status is normal, cranial nerves are intact, there are no motor or sensory deficits.  ED Results / Procedures / Treatments   Labs (all labs ordered are listed, but only abnormal results are displayed) Labs Reviewed  COMPREHENSIVE METABOLIC PANEL - Abnormal; Notable for the following components:      Result Value   Sodium 132 (*)    Glucose, Bld 379 (*)    BUN 28 (*)    Creatinine, Ser 1.06 (*)    All other components within normal limits  CBC WITH DIFFERENTIAL/PLATELET - Abnormal; Notable for the following components:   HCT 35.2 (*)    All other components within normal limits  LIPASE, BLOOD  URINALYSIS, ROUTINE W REFLEX MICROSCOPIC  I-STAT BETA HCG BLOOD, ED ([facility], [facility], [facility] ONLY)   Radiology US Abdomen Limited  Result Date: 04/18/2020 CLINICAL DATA:  Right upper quadrant abdominal pain EXAM: ULTRASOUND ABDOMEN LIMITED RIGHT UPPER QUADRANT COMPARISON:  CT 05/10/2019 FINDINGS: Gallbladder: No gallstones or wall thickening visualized. No sonographic Murphy sign noted by sonographer. Common bile duct: Diameter: 3 mm in proximal diameter Liver:  The hepatic parenchyma is diffusely, heterogeneously hyperechoic and the hepatic echotexture is coarsened in keeping with changes of moderate hepatic steatosis. Several hypoechoic regions are identified within the right hepatic lobe likely representing areas of focal fatty sparing. A an 18 mm subserosal lesion seen on image # 75 likely corresponds to an area probable vascular shunting on prior CT examination. No definite solid intrahepatic masses are identified. No intrahepatic biliary ductal dilation. Portal vein is patent on color Doppler imaging with normal direction of blood flow towards the liver. Other: None. IMPRESSION: Moderate hepatic steatosis. Electronically Signed   By: Fidela Salisbury MD   On: 04/18/2020 05:43    Procedures Procedures   Medications Ordered in ED Medications  morphine 4 MG/ML injection 4 mg (has no administration in time range)  lactated ringers bolus 1,000 mL (has no administration in time range)  ondansetron (ZOFRAN) injection 4 mg (4 mg Intravenous Given 04/18/20 0320)  morphine 4 MG/ML injection 4 mg (4 mg Intravenous Given 04/18/20 0318)  lactated ringers bolus 1,000 mL (1,000 mLs Intravenous New Bag/Given 04/18/20 0321)  diphenhydrAMINE (BENADRYL) capsule 25 mg (25 mg Oral Given 04/18/20 0328)  morphine 4 MG/ML injection 4 mg (4 mg Intravenous Given 04/18/20 0556)  prochlorperazine (COMPAZINE) injection 10 mg (10 mg Intravenous Given 04/18/20 0502)    ED Course  I have reviewed the triage vital signs and the nursing notes.  Pertinent labs & imaging results that were available during my care of the patient were reviewed by me and considered in my medical decision making (see chart for details).  MDM Rules/Calculators/A&P Right upper quadrant pain consistent with pancreatitis.  Consider biliary colic, cholecystitis, peptic ulcer disease.  Old records are reviewed, and she has had several CT scans of abdomen and pelvis which have been negative and several lipase is  which have been normal.  On review of care everywhere, she did have a CT scan showing some stranding around the pancreas in 2018, but labs for that encounter are not available.  With a relatively benign exam, I do not feel she needs advanced imaging today.  She will be given IV fluids, morphine, ondansetron and will check screening labs including lipase.  4:57 AM Labs  are reassuring. Creatinine has decreased compared with prior and lipase is also less than it was in a prior visit. Mild hyponatremia is present related to elevated glucose level and not felt to be clinically significant. No evidence of ketoacidosis. WBC is normal with normal differential. She continues to complain of ongoing pain and nausea in spite of morphine and ondansetron. She will be given prochlorperazine for nausea and will obtain right upper quadrant ultrasound.  7:14 AM Ultrasound is essentially negative.  However, patient states that her pain is actually increasing.  Vital signs have been stable.  Given worsening pain, CT of abdomen and pelvis is ordered.  Case is signed out to Dr. Jeanell Sparrow.  Final Clinical Impression(s) / ED Diagnoses Final diagnoses:  RUQ abdominal pain  Elevated random blood glucose level  Hyponatremia    Rx / DC Orders ED Discharge Orders    None       Delora Fuel, MD 79/98/72 [phone]

## 2020-04-18 NOTE — ED Triage Notes (Signed)
From home by EMS, sudden onset of abdominal pain starting at [facility], started after dinner in [place]Q. Intractable vomiting. Pt has prescription Zofran for pancreatitis, but it did not help. Hyperglycemic at 450.

## 2020-04-18 NOTE — ED Notes (Signed)
Patient transported to CT 

## 2020-04-18 NOTE — Discharge Instructions (Addendum)
Please use Compazine in addition to Zofran as needed for nausea. Please begin with clear liquids and advance gradually to include Gatorade and clear sodas.  When you have tolerated these well for several hours, you could be again having some soft foods. Return to the emergency department if you are having symptoms that are worsening and unable controlled with your medications, high fever, or other worsening symptoms or problems.

## 2020-07-23 DIAGNOSIS — R112 Nausea with vomiting, unspecified: Secondary | ICD-10-CM | POA: Insufficient documentation

## 2020-08-10 ENCOUNTER — Other Ambulatory Visit: Payer: Self-pay | Admitting: Cardiology

## 2020-08-10 DIAGNOSIS — I5022 Chronic systolic (congestive) heart failure: Secondary | ICD-10-CM

## 2020-08-10 DIAGNOSIS — I10 Essential (primary) hypertension: Secondary | ICD-10-CM

## 2020-08-12 DIAGNOSIS — R2232 Localized swelling, mass and lump, left upper limb: Secondary | ICD-10-CM | POA: Insufficient documentation

## 2020-10-07 ENCOUNTER — Encounter (HOSPITAL_BASED_OUTPATIENT_CLINIC_OR_DEPARTMENT_OTHER): Payer: Self-pay | Admitting: Obstetrics and Gynecology

## 2020-10-07 ENCOUNTER — Other Ambulatory Visit: Payer: Self-pay

## 2020-10-07 ENCOUNTER — Emergency Department (HOSPITAL_BASED_OUTPATIENT_CLINIC_OR_DEPARTMENT_OTHER): Payer: BC Managed Care – PPO

## 2020-10-07 ENCOUNTER — Emergency Department (HOSPITAL_BASED_OUTPATIENT_CLINIC_OR_DEPARTMENT_OTHER): Payer: BC Managed Care – PPO | Admitting: Radiology

## 2020-10-07 ENCOUNTER — Emergency Department (HOSPITAL_BASED_OUTPATIENT_CLINIC_OR_DEPARTMENT_OTHER)
Admission: EM | Admit: 2020-10-07 | Discharge: 2020-10-07 | Disposition: A | Discharge status: Home / Self Care | Payer: BC Managed Care – PPO | Attending: Emergency Medicine | Admitting: Emergency Medicine

## 2020-10-07 DIAGNOSIS — E039 Hypothyroidism, unspecified: Secondary | ICD-10-CM | POA: Diagnosis not present

## 2020-10-07 DIAGNOSIS — J810 Acute pulmonary edema: Secondary | ICD-10-CM | POA: Insufficient documentation

## 2020-10-07 DIAGNOSIS — Z794 Long term (current) use of insulin: Secondary | ICD-10-CM | POA: Insufficient documentation

## 2020-10-07 DIAGNOSIS — Z79899 Other long term (current) drug therapy: Secondary | ICD-10-CM | POA: Insufficient documentation

## 2020-10-07 DIAGNOSIS — I10 Essential (primary) hypertension: Secondary | ICD-10-CM | POA: Diagnosis not present

## 2020-10-07 DIAGNOSIS — R0602 Shortness of breath: Secondary | ICD-10-CM | POA: Diagnosis present

## 2020-10-07 DIAGNOSIS — E119 Type 2 diabetes mellitus without complications: Secondary | ICD-10-CM | POA: Diagnosis not present

## 2020-10-07 LAB — TROPONIN I (HIGH SENSITIVITY)
Troponin I (High Sensitivity): 10 ng/L (ref <18)
Troponin I (High Sensitivity): 14 ng/L (ref <18)

## 2020-10-07 LAB — BASIC METABOLIC PANEL
Anion gap: 13 (ref 5–15)
BUN: 22 mg/dL — ABNORMAL HIGH (ref 6–20)
CO2: 20 mmol/L — ABNORMAL LOW (ref 22–32)
Calcium: 8.4 mg/dL — ABNORMAL LOW (ref 8.9–10.3)
Chloride: 101 mmol/L (ref 98–111)
Creatinine, Ser: 0.96 mg/dL (ref 0.44–1.00)
GFR, Estimated: >60 mL/min (ref >60)
Glucose, Bld: 272 mg/dL — ABNORMAL HIGH (ref 70–99)
Potassium: 4.3 mmol/L (ref 3.5–5.1)
Sodium: 134 mmol/L — ABNORMAL LOW (ref 135–145)

## 2020-10-07 LAB — CBC
HCT: 32.1 % — ABNORMAL LOW (ref 36.0–46.0)
Hemoglobin: 11 g/dL — ABNORMAL LOW (ref 12.0–15.0)
MCH: 28.4 pg (ref 26.0–34.0)
MCHC: 34.3 g/dL (ref 30.0–36.0)
MCV: 82.9 fL (ref 80.0–100.0)
Platelets: 144 10*3/uL — ABNORMAL LOW (ref 150–400)
RBC: 3.87 MIL/uL (ref 3.87–5.11)
RDW: 15.6 % — ABNORMAL HIGH (ref 11.5–15.5)
WBC: 7.9 10*3/uL (ref 4.0–10.5)
nRBC: 0 % (ref 0.0–0.2)

## 2020-10-07 LAB — CBG MONITORING, ED: Glucose-Capillary: 262 mg/dL — ABNORMAL HIGH (ref 70–99)

## 2020-10-07 LAB — PREGNANCY, URINE: Preg Test, Ur: NEGATIVE

## 2020-10-07 MED ORDER — IOHEXOL 350 MG/ML SOLN
100.0000 mL | Freq: Once | INTRAVENOUS | Status: AC | PRN
  Administered 2020-10-07: 60 mL via INTRAVENOUS

## 2020-10-07 MED ORDER — FUROSEMIDE 10 MG/ML IJ SOLN
40.0000 mg | Freq: Once | INTRAMUSCULAR | Status: AC
  Administered 2020-10-07: 40 mg via INTRAVENOUS
  Filled 2020-10-07: qty 4

## 2020-10-07 MED ORDER — NITROGLYCERIN 0.4 MG SL SUBL
0.4000 mg | SUBLINGUAL_TABLET | SUBLINGUAL | Status: AC | PRN
  Administered 2020-10-07 (×3): 0.4 mg via SUBLINGUAL
  Filled 2020-10-07: qty 1

## 2020-10-07 MED ORDER — MORPHINE SULFATE (PF) 4 MG/ML IV SOLN
4.0000 mg | Freq: Once | INTRAVENOUS | Status: AC
  Administered 2020-10-07: 4 mg via INTRAVENOUS
  Filled 2020-10-07: qty 1

## 2020-10-07 MED ORDER — FUROSEMIDE 20 MG PO TABS: 20.0000 mg | ORAL_TABLET | Freq: Every day | ORAL | 0 refills | Status: DC

## 2020-10-07 NOTE — Discharge Instructions (Addendum)
Take your lasix as prescribed.  Follow up with your cardiologist.  Take you home medications.  Return for worsening symptoms

## 2020-10-07 NOTE — ED Triage Notes (Signed)
Patient reports to the ER for ShOB. Patient reports it started tonight and has gotten worse. Patient denies N/V/D.

## 2020-10-07 NOTE — ED Provider Notes (Signed)
[facility] EMERGENCY DEPT Provider Note   CSN: [id number] Arrival date & time: 10/07/20  1909     History Chief Complaint  Patient presents with   Shortness of Breath    Jennifer [name] is a 30 y.o. female.  30 yo F with a chief complaints of shortness of breath.  This been going on for a few hours now.  Started suddenly and is associated with left-sided chest pain.  This is pinpoint nothing seems to make it better or worse.  Has had a very mild cough with this.  Patient has had this happen to her multiple times in the past.  Tells me usually it happens when she gets fluid on her lungs.  Used to be on medication to make her urinate more but has been taken off of that and she is not exactly sure why.  She thinks she was on Lasix and thinks it was 25 mg.  She denies any lower extremity edema denies trauma to the chest wall.  The history is provided by the patient and the spouse.  Shortness of Breath Severity:  Moderate Onset quality:  Sudden Duration:  2 hours Timing:  Constant Progression:  Unchanged Chronicity:  New Relieved by:  Nothing Worsened by:  Nothing Ineffective treatments:  None tried Associated symptoms: cough   Associated symptoms: no chest pain, no fever, no headaches, no vomiting and no wheezing       Past Medical History:  Diagnosis Date   Astrocytoma ([place])    Brain tumor ([place]) 03/29/1995   Cholesterosis    Diabetes mellitus without complication ([place])    DM (diabetes mellitus) ([place]) 10/10/2018   Fatty liver    HTN (hypertension) 10/10/2018   Hypertension    Hypothyroidism 10/10/2018   Hypothyroidism    Lipoprotein deficiency    Lung disease    longevity long term   Pancreatitis    Polycystic ovary syndrome    Thyroid disease     Patient Active Problem List   Diagnosis Date Noted   DM (diabetes mellitus) ([place]) 10/10/2018   Hypothyroidism 10/10/2018    Past Surgical History:  Procedure Laterality Date   ABDOMINAL SURGERY     pt  states during miscarriage got her intestine   BRAIN SURGERY     EYE MUSCLE SURGERY Right 03/28/2014   VENTRICULOSTOMY  03/28/1997     OB History   No obstetric history on file.     Family History  Problem Relation Age of Onset   Diabetes Mother    Hypertension Mother    Hyperlipidemia Mother    Thyroid disease Mother    Hypertension Father    Diabetes Father     Social History   Tobacco Use   Smoking status: Never   Smokeless tobacco: Never  Vaping Use   Vaping Use: Never used  Substance Use Topics   Alcohol use: Never   Drug use: Never    Home Medications Prior to Admission medications   Medication Sig Start Date End Date Taking? Authorizing Provider  furosemide (LASIX) 20 MG tablet Take 1 tablet (20 mg total) by mouth daily. 10/07/20  Yes Deno Etienne, DO  Acetylcarnitine HCl (ACETYL L-CARNITINE) 500 MG CAPS Take 500 mg by mouth daily.    [provider]  albuterol (VENTOLIN HFA) 108 (90 Base) MCG/ACT inhaler Inhale 2 puffs into the lungs as needed for wheezing or shortness of breath.  12/31/18   [provider]  ALPHA LIPOIC ACID-BIOTIN PO Take 200 mg by mouth  daily.     [provider]  CRANBERRY FRUIT PO Take 1 capsule by mouth 2 (two) times a day.    [provider]  Cyanocobalamin (VITAMIN B12 PO) Take 1 Dose by mouth daily.    [provider]  cyclobenzaprine (FLEXERIL) 10 MG tablet Take 10 mg by mouth as needed. for muscle spams 11/19/18   [provider]  dicyclomine (BENTYL) 10 MG capsule Take 10 mg by mouth 3 (three) times daily as needed for spasms.  02/11/19   [provider]  DULoxetine (CYMBALTA) 60 MG capsule Take 60 mg by mouth daily.  01/28/19 07/22/19  [provider]  ENTRESTO 97-103 MG TAKE ONE TABLET BY MOUTH TWICE A DAY 08/10/20   [name], MD  famotidine (PEPCID) 20 MG tablet Take 20 mg by mouth 2 (two) times daily.  02/11/19   [provider]  fenofibrate (TRICOR) 145 MG  tablet Take 145 mg by mouth daily. 01/14/19   [provider]  fluticasone furoate-vilanterol (BREO ELLIPTA) 100-25 MCG/INH AEPB Inhale 1 puff into the lungs as needed (shortness of breath).  01/21/19   [provider]  HYDROcodone-acetaminophen (NORCO/VICODIN) 5-325 MG tablet Take 1 tablet by mouth every 6 (six) hours as needed. 05/10/19   Ripley Fraise, MD  ibuprofen (ADVIL) 600 MG tablet Take 1 tablet (600 mg total) by mouth every 6 (six) hours as needed. 10/19/18   Law, Bea Graff, PA-C  Insulin Glargine, 1 Unit Dial, (TOUJEO SOLOSTAR) 300 UNIT/ML SOPN Inject 80 Units into the skin in the morning and at bedtime. 50 units in the morning and 80 units at bedtime    [provider]  levothyroxine (SYNTHROID) 150 MCG tablet Take 150 mcg by mouth daily. 01/14/19   [provider]  metoprolol tartrate (LOPRESSOR) 100 MG tablet TAKE ONE TABLET BY MOUTH TWICE A DAY 08/10/20   [name], MD  nitroGLYCERIN (NITROSTAT) 0.4 MG SL tablet Place 1 tablet (0.4 mg total) under the tongue every 5 (five) minutes as needed for up to 25 days for chest pain. 08/01/19 08/26/19  [name], MD  norethindrone-ethinyl estradiol (OVCON-35) 0.4-35 MG-MCG tablet Take 1 tablet by mouth daily.    [provider]  NOVOLOG FLEXPEN 100 UNIT/ML FlexPen Inject 25 Units into the skin 2 (two) times daily with a meal. Every meal 04/19/19   [provider]  ondansetron (ZOFRAN) 4 MG tablet Take 4 mg by mouth as needed for nausea or vomiting.  12/19/18   [provider]  pregabalin (LYRICA) 300 MG capsule Take 300 mg by mouth 2 (two) times daily.     [provider]  prochlorperazine (COMPAZINE) 10 MG tablet Take 1 tablet (10 mg total) by mouth 2 (two) times daily as needed for nausea or vomiting. 04/18/20   Pattricia Boss, MD  rifaximin (XIFAXAN) 200 MG tablet Take 200 mg by mouth daily.    [provider]  rosuvastatin (CRESTOR) 40 MG tablet Take 40 mg by mouth  daily.    [provider]    Allergies    Clarithromycin, Shellfish allergy, Penicillins, and Prednisone  Review of Systems   Review of Systems  Constitutional:  Negative for chills and fever.  HENT:  Negative for congestion and rhinorrhea.   Eyes:  Negative for redness and visual disturbance.  Respiratory:  Positive for cough and shortness of breath. Negative for wheezing.   Cardiovascular:  Negative for chest pain and palpitations.  Gastrointestinal:  Negative for nausea and vomiting.  Genitourinary:  Negative for dysuria and urgency.  Musculoskeletal:  Negative for arthralgias and myalgias.  Skin:  Negative for pallor and wound.  Neurological:  Negative for dizziness and headaches.   Physical Exam Updated Vital Signs BP (!) 141/78 (BP Location: Right Arm)   Pulse (!) 119   Temp 98.3 F (36.8 C) (Oral)   Resp 20   Ht 4' 9"  (1.448 m)   Wt 55.8 kg   LMP 09/16/2020 Comment: neg preg test  SpO2 98%   BMI 26.62 kg/m   Physical Exam Vitals and nursing note reviewed.  Constitutional:      General: [name] not in acute distress.    Appearance: [name] well-developed. [name] not diaphoretic.     Comments: Chronically ill-appearing right-sided ptosis  HENT:     Head: Normocephalic and atraumatic.  Eyes:     Pupils: Pupils are equal, round, and reactive to light.  Cardiovascular:     Rate and Rhythm: Normal rate and regular rhythm.     Heart sounds: No murmur heard.   No friction rub. No gallop.  Pulmonary:     Effort: Pulmonary effort is normal.     Breath sounds: No wheezing or rales.  Abdominal:     General: There is no distension.     Palpations: Abdomen is soft.     Tenderness: There is no abdominal tenderness.  Musculoskeletal:        General: No tenderness.     Cervical back: Normal range of motion and neck supple.  Skin:    General: Skin is warm and dry.  Neurological:     Mental Status: [name] alert and oriented to person, place, and time.   Psychiatric:        Behavior: Behavior normal.    ED Results / Procedures / Treatments   Labs (all labs ordered are listed, but only abnormal results are displayed) Labs Reviewed  BASIC METABOLIC PANEL - Abnormal; Notable for the following components:      Result Value   Sodium 134 (*)    CO2 20 (*)    Glucose, Bld 272 (*)    BUN 22 (*)    Calcium 8.4 (*)    All other components within normal limits  CBC - Abnormal; Notable for the following components:   Hemoglobin 11.0 (*)    HCT 32.1 (*)    RDW 15.6 (*)    Platelets 144 (*)    All other components within normal limits  CBG MONITORING, ED - Abnormal; Notable for the following components:   Glucose-Capillary 262 (*)    All other components within normal limits  PREGNANCY, URINE  TROPONIN I (HIGH SENSITIVITY)  TROPONIN I (HIGH SENSITIVITY)    EKG None  Radiology DG Chest 2 View  Result Date: 10/07/2020 CLINICAL DATA:  30 year old female with shortness of breath. EXAM: CHEST - 2 VIEW COMPARISON:  Chest radiograph dated 10/19/2018. FINDINGS: There is diffuse interstitial prominence with Kerley B-lines consistent with edema. Atypical pneumonia is not excluded clinical correlation is recommended. No focal consolidation, pleural effusion, or pneumothorax. The cardiac silhouette is within limits. No acute osseous pathology. IMPRESSION: Interstitial edema.  Pneumonia is not excluded. Electronically Signed   By: Anner Crete M.D.   On: 10/07/2020 20:06   CT Angio Chest PE W and/or Wo Contrast  Result Date: 10/07/2020 CLINICAL DATA:  30 year old female with concern for pulmonary embolism. EXAM: CT ANGIOGRAPHY CHEST WITH CONTRAST TECHNIQUE: Multidetector CT imaging of the chest was performed  using the standard protocol during bolus administration of intravenous contrast. Multiplanar CT image reconstructions and MIPs were obtained to evaluate the vascular anatomy. CONTRAST:  32m OMNIPAQUE IOHEXOL 350 MG/ML SOLN COMPARISON:  Chest  radiograph dated 10/07/2020. FINDINGS: Cardiovascular: Mild cardiomegaly. No pericardial effusion. The thoracic aorta is unremarkable. The origins of the great vessels of the aortic arch appear patent. No pulmonary artery embolus identified. Mediastinum/Nodes: Mildly enlarged bilateral hilar lymph nodes, likely reactive. The esophagus and the thyroid gland are grossly unremarkable. No mediastinal fluid collection. Lungs/Pleura: Small bilateral pleural effusions. There is diffuse interstitial and interlobular septal prominence as well as diffuse 80 and ground-glass airspace opacity consistent with edema. Pneumonia is not excluded there is no pneumothorax. The central airways are patent. Upper Abdomen: Fatty liver. Musculoskeletal: No chest wall abnormality. No acute or significant osseous findings. Review of the MIP images confirms the above findings. IMPRESSION: 1. No CT evidence of pulmonary embolism. 2. Mild cardiomegaly with findings of CHF and small bilateral pleural effusions. Pneumonia is not excluded. Clinical correlation is recommended. 3. Fatty liver. Electronically Signed   By: AAnner CreteM.D.   On: 10/07/2020 22:25    Procedures Procedures   Medications Ordered in ED Medications  nitroGLYCERIN (NITROSTAT) SL tablet 0.4 mg (0.4 mg Sublingual Given 10/07/20 2237)  iohexol (OMNIPAQUE) 350 MG/ML injection 100 mL (60 mLs Intravenous Contrast Given 10/07/20 2204)  furosemide (LASIX) injection 40 mg (40 mg Intravenous Given 10/07/20 2242)  morphine 4 MG/ML injection 4 mg (4 mg Intravenous Given 10/07/20 2304)    ED Course  I have reviewed the triage vital signs and the nursing notes.  Pertinent labs & imaging results that were available during my care of the patient were reviewed by me and considered in my medical decision making (see chart for details).    MDM Rules/Calculators/A&P                          30yo F with a chief complaints of sudden onset left-sided chest pain.  Going on  just for a few hours now.  The patient tells me this is happened to her before and was associated with fluid overload.  Chest x-ray viewed by me with very small amount of edema.  No obvious lung findings.  She was given 3 doses of nitro with significant improvement of her blood pressure and some mild improvement of her symptoms.  CT scan of the chest was performed and is negative for pulmonary embolism and is consistent for a very mild fluid overload.  [name] not tachypneic and not requiring oxygen.  [name] significantly tachycardic here and denies any missed doses of her medications but I suspect the patient likely has not taken her evening dose of metoprolol.  We will have her follow-up with her cardiologist in the office.  Bolus dose of Lasix here.  11:22 PM:  I have discussed the diagnosis/risks/treatment options with the patient and family and believe the pt to be eligible for discharge home to follow-up with PCP, Cards. We also discussed returning to the ED immediately if new or worsening sx occur. We discussed the sx which are most concerning (e.g., sudden worsening pain, fever, inability to tolerate by mouth) that necessitate immediate return. Medications administered to the patient during their visit and any new prescriptions provided to the patient are listed below.  Medications given during this visit Medications  nitroGLYCERIN (NITROSTAT) SL tablet 0.4 mg (0.4 mg Sublingual Given 10/07/20 2237)  iohexol (OMNIPAQUE) 350 MG/ML injection 100 mL (60 mLs Intravenous Contrast Given 10/07/20 2204)  furosemide (LASIX) injection 40 mg (40 mg Intravenous Given 10/07/20 2242)  morphine 4 MG/ML injection 4 mg (4 mg Intravenous Given 10/07/20 2304)     The patient appears reasonably screen and/or stabilized for discharge and I doubt any other medical condition or other [facility] requiring further screening, evaluation, or treatment in the ED at this time prior to discharge.   Final Clinical Impression(s) / ED  Diagnoses Final diagnoses:  Acute pulmonary edema ([place])    Rx / DC Orders ED Discharge Orders          Ordered    furosemide (LASIX) 20 MG tablet  Daily        10/07/20 2253             Deno Etienne, DO 10/07/20 2322

## 2020-11-22 ENCOUNTER — Emergency Department (HOSPITAL_COMMUNITY): Payer: BC Managed Care – PPO

## 2020-11-22 ENCOUNTER — Other Ambulatory Visit: Payer: Self-pay

## 2020-11-22 ENCOUNTER — Inpatient Hospital Stay (HOSPITAL_COMMUNITY)
Admission: EM | Admit: 2020-11-22 | Discharge: 2020-12-06 | DRG: 545 | Disposition: A | Discharge status: Home / Self Care | Payer: BC Managed Care – PPO | Attending: Internal Medicine | Admitting: Internal Medicine

## 2020-11-22 ENCOUNTER — Encounter (HOSPITAL_COMMUNITY): Payer: Self-pay | Admitting: Emergency Medicine

## 2020-11-22 DIAGNOSIS — K3184 Gastroparesis: Secondary | ICD-10-CM | POA: Diagnosis present

## 2020-11-22 DIAGNOSIS — Z79899 Other long term (current) drug therapy: Secondary | ICD-10-CM

## 2020-11-22 DIAGNOSIS — N1832 Chronic kidney disease, stage 3b: Secondary | ICD-10-CM

## 2020-11-22 DIAGNOSIS — I5022 Chronic systolic (congestive) heart failure: Secondary | ICD-10-CM

## 2020-11-22 DIAGNOSIS — Z8249 Family history of ischemic heart disease and other diseases of the circulatory system: Secondary | ICD-10-CM

## 2020-11-22 DIAGNOSIS — E1065 Type 1 diabetes mellitus with hyperglycemia: Secondary | ICD-10-CM | POA: Diagnosis present

## 2020-11-22 DIAGNOSIS — E781 Pure hyperglyceridemia: Secondary | ICD-10-CM

## 2020-11-22 DIAGNOSIS — I10 Essential (primary) hypertension: Secondary | ICD-10-CM

## 2020-11-22 DIAGNOSIS — I083 Combined rheumatic disorders of mitral, aortic and tricuspid valves: Secondary | ICD-10-CM | POA: Diagnosis present

## 2020-11-22 DIAGNOSIS — E1169 Type 2 diabetes mellitus with other specified complication: Secondary | ICD-10-CM

## 2020-11-22 DIAGNOSIS — I776 Arteritis, unspecified: Secondary | ICD-10-CM | POA: Diagnosis not present

## 2020-11-22 DIAGNOSIS — I5021 Acute systolic (congestive) heart failure: Secondary | ICD-10-CM

## 2020-11-22 DIAGNOSIS — E1043 Type 1 diabetes mellitus with diabetic autonomic (poly)neuropathy: Secondary | ICD-10-CM | POA: Diagnosis present

## 2020-11-22 DIAGNOSIS — Z9221 Personal history of antineoplastic chemotherapy: Secondary | ICD-10-CM

## 2020-11-22 DIAGNOSIS — R570 Cardiogenic shock: Secondary | ICD-10-CM

## 2020-11-22 DIAGNOSIS — Z8349 Family history of other endocrine, nutritional and metabolic diseases: Secondary | ICD-10-CM

## 2020-11-22 DIAGNOSIS — Z79890 Hormone replacement therapy (postmenopausal): Secondary | ICD-10-CM

## 2020-11-22 DIAGNOSIS — R1013 Epigastric pain: Secondary | ICD-10-CM

## 2020-11-22 DIAGNOSIS — I2510 Atherosclerotic heart disease of native coronary artery without angina pectoris: Secondary | ICD-10-CM | POA: Diagnosis present

## 2020-11-22 DIAGNOSIS — Z452 Encounter for adjustment and management of vascular access device: Secondary | ICD-10-CM

## 2020-11-22 DIAGNOSIS — I951 Orthostatic hypotension: Secondary | ICD-10-CM

## 2020-11-22 DIAGNOSIS — Q613 Polycystic kidney, unspecified: Secondary | ICD-10-CM

## 2020-11-22 DIAGNOSIS — R9389 Abnormal findings on diagnostic imaging of other specified body structures: Secondary | ICD-10-CM

## 2020-11-22 DIAGNOSIS — E1040 Type 1 diabetes mellitus with diabetic neuropathy, unspecified: Secondary | ICD-10-CM | POA: Diagnosis present

## 2020-11-22 DIAGNOSIS — I110 Hypertensive heart disease with heart failure: Secondary | ICD-10-CM | POA: Diagnosis present

## 2020-11-22 DIAGNOSIS — E039 Hypothyroidism, unspecified: Secondary | ICD-10-CM | POA: Diagnosis present

## 2020-11-22 DIAGNOSIS — Z794 Long term (current) use of insulin: Secondary | ICD-10-CM

## 2020-11-22 DIAGNOSIS — E282 Polycystic ovarian syndrome: Secondary | ICD-10-CM | POA: Diagnosis present

## 2020-11-22 DIAGNOSIS — E872 Acidosis: Secondary | ICD-10-CM | POA: Diagnosis present

## 2020-11-22 DIAGNOSIS — K759 Inflammatory liver disease, unspecified: Secondary | ICD-10-CM

## 2020-11-22 DIAGNOSIS — Z83438 Family history of other disorder of lipoprotein metabolism and other lipidemia: Secondary | ICD-10-CM

## 2020-11-22 DIAGNOSIS — K760 Fatty (change of) liver, not elsewhere classified: Secondary | ICD-10-CM | POA: Diagnosis present

## 2020-11-22 DIAGNOSIS — I5043 Acute on chronic combined systolic (congestive) and diastolic (congestive) heart failure: Secondary | ICD-10-CM | POA: Diagnosis present

## 2020-11-22 DIAGNOSIS — N179 Acute kidney failure, unspecified: Secondary | ICD-10-CM

## 2020-11-22 DIAGNOSIS — R109 Unspecified abdominal pain: Secondary | ICD-10-CM

## 2020-11-22 DIAGNOSIS — Z91013 Allergy to seafood: Secondary | ICD-10-CM

## 2020-11-22 DIAGNOSIS — I428 Other cardiomyopathies: Secondary | ICD-10-CM

## 2020-11-22 DIAGNOSIS — E119 Type 2 diabetes mellitus without complications: Secondary | ICD-10-CM

## 2020-11-22 DIAGNOSIS — R197 Diarrhea, unspecified: Secondary | ICD-10-CM

## 2020-11-22 DIAGNOSIS — Z833 Family history of diabetes mellitus: Secondary | ICD-10-CM

## 2020-11-22 DIAGNOSIS — Z20822 Contact with and (suspected) exposure to covid-19: Secondary | ICD-10-CM | POA: Diagnosis present

## 2020-11-22 DIAGNOSIS — D649 Anemia, unspecified: Secondary | ICD-10-CM | POA: Diagnosis present

## 2020-11-22 DIAGNOSIS — R9431 Abnormal electrocardiogram [ECG] [EKG]: Secondary | ICD-10-CM

## 2020-11-22 DIAGNOSIS — E871 Hypo-osmolality and hyponatremia: Secondary | ICD-10-CM | POA: Diagnosis present

## 2020-11-22 DIAGNOSIS — R700 Elevated erythrocyte sedimentation rate: Secondary | ICD-10-CM | POA: Diagnosis present

## 2020-11-22 DIAGNOSIS — R748 Abnormal levels of other serum enzymes: Secondary | ICD-10-CM

## 2020-11-22 DIAGNOSIS — E1022 Type 1 diabetes mellitus with diabetic chronic kidney disease: Secondary | ICD-10-CM | POA: Diagnosis present

## 2020-11-22 DIAGNOSIS — K7200 Acute and subacute hepatic failure without coma: Secondary | ICD-10-CM

## 2020-11-22 DIAGNOSIS — E785 Hyperlipidemia, unspecified: Secondary | ICD-10-CM | POA: Diagnosis present

## 2020-11-22 DIAGNOSIS — I959 Hypotension, unspecified: Secondary | ICD-10-CM

## 2020-11-22 DIAGNOSIS — K858 Other acute pancreatitis without necrosis or infection: Secondary | ICD-10-CM | POA: Diagnosis present

## 2020-11-22 DIAGNOSIS — E875 Hyperkalemia: Secondary | ICD-10-CM | POA: Diagnosis present

## 2020-11-22 DIAGNOSIS — I5041 Acute combined systolic (congestive) and diastolic (congestive) heart failure: Secondary | ICD-10-CM

## 2020-11-22 DIAGNOSIS — Z85841 Personal history of malignant neoplasm of brain: Secondary | ICD-10-CM

## 2020-11-22 DIAGNOSIS — I130 Hypertensive heart and chronic kidney disease with heart failure and stage 1 through stage 4 chronic kidney disease, or unspecified chronic kidney disease: Secondary | ICD-10-CM | POA: Diagnosis present

## 2020-11-22 DIAGNOSIS — K861 Other chronic pancreatitis: Secondary | ICD-10-CM | POA: Diagnosis present

## 2020-11-22 DIAGNOSIS — R7401 Elevation of levels of liver transaminase levels: Secondary | ICD-10-CM

## 2020-11-22 DIAGNOSIS — Z880 Allergy status to penicillin: Secondary | ICD-10-CM

## 2020-11-22 DIAGNOSIS — W19XXXA Unspecified fall, initial encounter: Secondary | ICD-10-CM

## 2020-11-22 DIAGNOSIS — R000 Tachycardia, unspecified: Secondary | ICD-10-CM

## 2020-11-22 DIAGNOSIS — H02401 Unspecified ptosis of right eyelid: Secondary | ICD-10-CM | POA: Diagnosis not present

## 2020-11-22 DIAGNOSIS — Z888 Allergy status to other drugs, medicaments and biological substances status: Secondary | ICD-10-CM

## 2020-11-22 DIAGNOSIS — I509 Heart failure, unspecified: Secondary | ICD-10-CM

## 2020-11-22 DIAGNOSIS — E786 Lipoprotein deficiency: Secondary | ICD-10-CM | POA: Diagnosis present

## 2020-11-22 LAB — LACTIC ACID, PLASMA
Lactic Acid, Venous: 2.9 mmol/L (ref 0.5–1.9)
Lactic Acid, Venous: 2.9 mmol/L (ref 0.5–1.9)

## 2020-11-22 LAB — URINALYSIS, ROUTINE W REFLEX MICROSCOPIC
Bilirubin Urine: NEGATIVE
Glucose, UA: 50 mg/dL — AB
Hgb urine dipstick: NEGATIVE
Ketones, ur: NEGATIVE mg/dL
Nitrite: NEGATIVE
Protein, ur: 30 mg/dL — AB
Specific Gravity, Urine: 1.015 (ref 1.005–1.030)
pH: 5 (ref 5.0–8.0)

## 2020-11-22 LAB — CBC WITH DIFFERENTIAL/PLATELET
Abs Immature Granulocytes: 0.07 10*3/uL (ref 0.00–0.07)
Basophils Absolute: 0 10*3/uL (ref 0.0–0.1)
Basophils Relative: 0 %
Eosinophils Absolute: 0.1 10*3/uL (ref 0.0–0.5)
Eosinophils Relative: 1 %
HCT: 33.8 % — ABNORMAL LOW (ref 36.0–46.0)
Hemoglobin: 11 g/dL — ABNORMAL LOW (ref 12.0–15.0)
Immature Granulocytes: 1 %
Lymphocytes Relative: 25 %
Lymphs Abs: 1.6 10*3/uL (ref 0.7–4.0)
MCH: 28.4 pg (ref 26.0–34.0)
MCHC: 32.5 g/dL (ref 30.0–36.0)
MCV: 87.1 fL (ref 80.0–100.0)
Monocytes Absolute: 0.5 10*3/uL (ref 0.1–1.0)
Monocytes Relative: 8 %
Neutro Abs: 4.4 10*3/uL (ref 1.7–7.7)
Neutrophils Relative %: 65 %
Platelets: 170 10*3/uL (ref 150–400)
RBC: 3.88 MIL/uL (ref 3.87–5.11)
RDW: 15.9 % — ABNORMAL HIGH (ref 11.5–15.5)
WBC: 6.7 10*3/uL (ref 4.0–10.5)
nRBC: 0.5 % — ABNORMAL HIGH (ref 0.0–0.2)

## 2020-11-22 LAB — BLOOD GAS, VENOUS
Bicarbonate: 19.6 mmol/L — ABNORMAL LOW (ref 20.0–28.0)
Patient temperature: 98.6
pCO2, Ven: 41.1 mmHg — ABNORMAL LOW (ref 44.0–60.0)
pH, Ven: 7.299 (ref 7.250–7.430)
pO2, Ven: 61.7 mmHg — ABNORMAL HIGH (ref 32.0–45.0)

## 2020-11-22 LAB — COMPREHENSIVE METABOLIC PANEL
ALT: 74 U/L — ABNORMAL HIGH (ref 0–44)
AST: 101 U/L — ABNORMAL HIGH (ref 15–41)
Albumin: 3.7 g/dL (ref 3.5–5.0)
Alkaline Phosphatase: 84 U/L (ref 38–126)
Anion gap: 10 (ref 5–15)
BUN: 34 mg/dL — ABNORMAL HIGH (ref 6–20)
CO2: 17 mmol/L — ABNORMAL LOW (ref 22–32)
Calcium: 8.5 mg/dL — ABNORMAL LOW (ref 8.9–10.3)
Chloride: 105 mmol/L (ref 98–111)
Creatinine, Ser: 1.38 mg/dL — ABNORMAL HIGH (ref 0.44–1.00)
GFR, Estimated: 53 mL/min — ABNORMAL LOW (ref >60)
Glucose, Bld: 352 mg/dL — ABNORMAL HIGH (ref 70–99)
Potassium: 6.2 mmol/L — ABNORMAL HIGH (ref 3.5–5.1)
Sodium: 132 mmol/L — ABNORMAL LOW (ref 135–145)
Total Bilirubin: 3 mg/dL — ABNORMAL HIGH (ref 0.3–1.2)

## 2020-11-22 LAB — I-STAT CHEM 8, ED
BUN: 31 mg/dL — ABNORMAL HIGH (ref 6–20)
Calcium, Ion: 1.15 mmol/L (ref 1.15–1.40)
Chloride: 110 mmol/L (ref 98–111)
Creatinine, Ser: 1 mg/dL (ref 0.44–1.00)
Glucose, Bld: 182 mg/dL — ABNORMAL HIGH (ref 70–99)
HCT: 31 % — ABNORMAL LOW (ref 36.0–46.0)
Hemoglobin: 10.5 g/dL — ABNORMAL LOW (ref 12.0–15.0)
Potassium: 4.4 mmol/L (ref 3.5–5.1)
Sodium: 135 mmol/L (ref 135–145)
TCO2: 22 mmol/L (ref 22–32)

## 2020-11-22 LAB — LIPASE, BLOOD: Lipase: 29 U/L (ref 11–51)

## 2020-11-22 LAB — I-STAT BETA HCG BLOOD, ED (MC, WL, AP ONLY): I-stat hCG, quantitative: <5 m[IU]/mL (ref <5)

## 2020-11-22 MED ORDER — ONDANSETRON HCL 4 MG/2ML IJ SOLN
4.0000 mg | Freq: Once | INTRAMUSCULAR | Status: AC
  Administered 2020-11-22: 4 mg via INTRAVENOUS
  Filled 2020-11-22: qty 2

## 2020-11-22 MED ORDER — SODIUM CHLORIDE 0.9 % IV BOLUS
1000.0000 mL | Freq: Once | INTRAVENOUS | Status: AC
  Administered 2020-11-22: 1000 mL via INTRAVENOUS

## 2020-11-22 MED ORDER — DIPHENHYDRAMINE HCL 50 MG/ML IJ SOLN
25.0000 mg | Freq: Once | INTRAMUSCULAR | Status: AC
  Administered 2020-11-22: 25 mg via INTRAVENOUS
  Filled 2020-11-22: qty 1

## 2020-11-22 MED ORDER — SODIUM ZIRCONIUM CYCLOSILICATE 10 G PO PACK
10.0000 g | PACK | Freq: Once | ORAL | Status: DC
  Filled 2020-11-22: qty 1

## 2020-11-22 MED ORDER — FAMOTIDINE IN NACL 20-0.9 MG/50ML-% IV SOLN
20.0000 mg | Freq: Once | INTRAVENOUS | Status: AC
  Administered 2020-11-22: 20 mg via INTRAVENOUS
  Filled 2020-11-22: qty 50

## 2020-11-22 MED ORDER — HYDROMORPHONE HCL 2 MG/ML IJ SOLN
2.0000 mg | Freq: Once | INTRAMUSCULAR | Status: AC
  Administered 2020-11-22: 2 mg via INTRAVENOUS
  Filled 2020-11-22: qty 1

## 2020-11-22 MED ORDER — SODIUM CHLORIDE 0.9 % IV BOLUS
1000.0000 mL | Freq: Once | INTRAVENOUS | Status: AC
Start: 2020-11-22 — End: 2020-11-22
  Administered 2020-11-22: 1000 mL via INTRAVENOUS

## 2020-11-22 MED ORDER — IOHEXOL 350 MG/ML SOLN
80.0000 mL | Freq: Once | INTRAVENOUS | Status: AC | PRN
  Administered 2020-11-22: 80 mL via INTRAVENOUS

## 2020-11-22 MED ORDER — INSULIN ASPART 100 UNIT/ML IV SOLN
5.0000 [IU] | Freq: Once | INTRAVENOUS | Status: DC
  Filled 2020-11-22: qty 0.05

## 2020-11-22 MED ORDER — HYDROMORPHONE HCL 1 MG/ML IJ SOLN
1.0000 mg | Freq: Once | INTRAMUSCULAR | Status: AC
  Administered 2020-11-23: 1 mg via INTRAVENOUS
  Filled 2020-11-22: qty 1

## 2020-11-22 NOTE — ED Provider Notes (Signed)
Emergency Medicine Provider Triage Evaluation Note  Jennifer [name] , a 30 y.o. female  was evaluated in triage.  Pt complains of Abdominal pain for two weeks.  She had been having bright red blood on the toilet paper with wiping.  She reports abdominal pain, diarrhea and vomiting.  She denies fevers.  She asks for pain medication.  She was seen on 11/18/20 at [facility] for hyperglycemia and similar.    Review of Systems  Positive: Abdominal pain, emesis, diarrhea.  Negative: fevers  Physical Exam  BP 140/89 (BP Location: Right Arm)   Pulse (!) 118   Temp 97.8 F (36.6 C) (Oral)   Resp 16   SpO2 94%  Gen:   Awake, appears uncomfortable Resp:  Normal effort  MSK:   Moves extremities without difficulty  Other:  Abdomen is diffusely tender  Medical Decision Making  Medically screening exam initiated at 5:38 PM.  Appropriate orders placed.  Kerie Badger was informed that the remainder of the evaluation will be completed by another provider, this initial triage assessment does not replace that evaluation, and the importance of remaining in the ED until their evaluation is complete.     Lorin Glass, PA-C 11/22/20 1742    Jeanell Sparrow, DO 11/23/20 [phone]

## 2020-11-22 NOTE — Discharge Instructions (Addendum)
Continue taking antacid medications as prescribed. Clear liquid diet. Please follow up with gastroenterology in the next week.

## 2020-11-22 NOTE — ED Provider Notes (Addendum)
[facility] DEPT Provider Note   CSN: [id number] Arrival date & time: 11/22/20  1633     History No chief complaint on file.   Jennifer [name] is a 30 y.o. female.  30 year old female history as below significant for recurrent pancreatitis (she reports secondary to HLD, "enzyme issues," not a/w ETOH), compliance with home Cre[facility].  Patient to the ER secondary to midepigastric abdominal discomfort, diarrhea over the past 2 weeks. Similar to prior pain associated with pancreatitis. Pain is sharp, stabbing, burning. Non radiating. Reduced oral intake secondary to abdominal discomfort. No alcohol use.  Last evaluated by gastroenterology within the last year, EGD was performed.  Prior HIDA scan nonacute.  No fevers, chills, suspicious oral intake.  No recent travel or sick contacts.  No change urination.  Having diarrhea, no melena. Nausea without any emesis. She does have some scant bleeding with wiping.  No recent changes to diet or medication changes.  The history is provided by the patient. No language interpreter was used.      Past Medical History:  Diagnosis Date   Astrocytoma [facility])    Brain tumor ([place]) 03/29/1995   Cholesterosis    Diabetes mellitus without complication ([place])    DM (diabetes mellitus) ([place]) 10/10/2018   Fatty liver    HTN (hypertension) 10/10/2018   Hypertension    Hypothyroidism 10/10/2018   Hypothyroidism    Lipoprotein deficiency    Lung disease    longevity long term   Pancreatitis    Polycystic ovary syndrome    Thyroid disease     Patient Active Problem List   Diagnosis Date Noted   DM (diabetes mellitus) ([place]) 10/10/2018   Hypothyroidism 10/10/2018    Past Surgical History:  Procedure Laterality Date   ABDOMINAL SURGERY     pt states during miscarriage got her intestine   BRAIN SURGERY     EYE MUSCLE SURGERY Right 03/28/2014   VENTRICULOSTOMY  03/28/1997     OB History   No obstetric history on file.      Family History  Problem Relation Age of Onset   Diabetes Mother    Hypertension Mother    Hyperlipidemia Mother    Thyroid disease Mother    Hypertension Father    Diabetes Father     Social History   Tobacco Use   Smoking status: Never   Smokeless tobacco: Never  Vaping Use   Vaping Use: Never used  Substance Use Topics   Alcohol use: Never   Drug use: Never    Home Medications Prior to Admission medications   Medication Sig Start Date End Date Taking? Authorizing Provider  Acetylcarnitine HCl (ACETYL L-CARNITINE) 500 MG CAPS Take 500 mg by mouth daily.   Yes [provider]  albuterol (VENTOLIN HFA) 108 (90 Base) MCG/ACT inhaler Inhale 2 puffs into the lungs as needed for wheezing or shortness of breath.  12/31/18  Yes [provider]  Coenzyme Q10 (COQ10 PO) Take 1 tablet by mouth daily.   Yes [provider]  CRANBERRY FRUIT PO Take 1 capsule by mouth 2 (two) times a day.   Yes [provider]  Cyanocobalamin (VITAMIN B12 PO) Take 1 Dose by mouth daily.   Yes [provider]  cyclobenzaprine (FLEXERIL) 10 MG tablet Take 10 mg by mouth as needed. for muscle spams 11/19/18  Yes [provider]  dicyclomine (BENTYL) 10 MG capsule Take 10 mg by mouth 3 (three) times daily as needed for spasms.  02/11/19  Yes [provider]  DULoxetine (CYMBALTA) 30 MG capsule Take 60 mg by mouth daily.  01/28/19 11/23/20 Yes [provider]  famotidine (PEPCID) 20 MG tablet Take 20 mg by mouth 2 (two) times daily.  02/11/19  Yes [provider]  fenofibrate (TRICOR) 145 MG tablet Take 145 mg by mouth daily. 01/14/19  Yes [provider]  fluticasone furoate-vilanterol (BREO ELLIPTA) 100-25 MCG/INH AEPB Inhale 1 puff into the lungs as needed (shortness of breath).  01/21/19  Yes [provider]  furosemide (LASIX) 20 MG tablet Take 1 tablet (20 mg total) by mouth daily. 10/07/20  Yes Deno Etienne, DO   ibuprofen (ADVIL) 600 MG tablet Take 1 tablet (600 mg total) by mouth every 6 (six) hours as needed. 10/19/18  Yes Law, Bea Graff, PA-C  Insulin Glargine, 1 Unit Dial, (TOUJEO SOLOSTAR) 300 UNIT/ML SOPN Inject 94 Units into the skin in the morning and at bedtime.   Yes [provider]  levothyroxine (SYNTHROID) 150 MCG tablet Take 150 mcg by mouth daily. 01/14/19  Yes [provider]  metoprolol (TOPROL-XL) 200 MG 24 hr tablet Take 200 mg by mouth daily.   Yes [provider]  metoprolol succinate (TOPROL-XL) 50 MG 24 hr tablet Take 50 mg by mouth daily. Take along w/ 26m Daily   Yes [provider]  norethindrone-ethinyl estradiol (OVCON-35) 0.4-35 MG-MCG tablet Take 1 tablet by mouth daily.   Yes [provider]  NOVOLOG FLEXPEN 100 UNIT/ML FlexPen Inject 60 Units into the skin 3 (three) times daily. Every meal 04/19/19  Yes [provider]  ondansetron (ZOFRAN) 4 MG tablet Take 4 mg by mouth as needed for nausea or vomiting.  12/19/18  Yes [provider]  pantoprazole (PROTONIX) 40 MG tablet Take 40 mg by mouth daily. 12/24/19  Yes [provider]  pregabalin (LYRICA) 300 MG capsule Take 300 mg by mouth 2 (two) times daily.    Yes [provider]  prochlorperazine (COMPAZINE) 10 MG tablet Take 1 tablet (10 mg total) by mouth 2 (two) times daily as needed for nausea or vomiting. 04/18/20  Yes RPattricia Boss MD  rosuvastatin (CRESTOR) 40 MG tablet Take 40 mg by mouth daily.   Yes [provider]  sacubitril-valsartan (ENTRESTO) 24-26 MG Take 1 tablet by mouth 2 (two) times daily.   Yes [provider]  ALPHA LIPOIC ACID-BIOTIN PO Take 200 mg by mouth daily.  Patient not taking: Reported on 11/23/2020    [provider]  ENTRESTO 97-103 MG TAKE ONE TABLET BY MOUTH TWICE A DAY Patient not taking: Reported on 11/23/2020 08/10/20   GAdrian Prows MD  HYDROcodone-acetaminophen (NORCO/VICODIN) 5-325 MG  tablet Take 1 tablet by mouth every 6 (six) hours as needed. Patient not taking: Reported on 11/23/2020 05/10/19   WRipley Fraise MD  metoprolol tartrate (LOPRESSOR) 100 MG tablet TAKE ONE TABLET BY MOUTH TWICE A DAY Patient not taking: Reported on 11/23/2020 08/10/20   GAdrian Prows MD  nitroGLYCERIN (NITROSTAT) 0.4 MG SL tablet Place 1 tablet (0.4 mg total) under the tongue every 5 (five) minutes as needed for up to 25 days for chest pain. 08/01/19 08/26/19  GAdrian Prows MD    Allergies    Ketamine, Clarithromycin, Shellfish allergy, Penicillins, and Prednisone  Review of Systems   Review of Systems  Constitutional:  Negative for activity change and fever.  HENT:  Negative for facial swelling and trouble swallowing.   Eyes:  Negative for discharge and redness.  Respiratory:  Negative for cough and shortness of breath.   Cardiovascular:  Negative for chest pain and palpitations.  Gastrointestinal:  Positive for abdominal pain and diarrhea. Negative for nausea.  Genitourinary:  Negative for dysuria and flank pain.  Musculoskeletal:  Negative for back pain and gait problem.  Skin:  Negative for pallor and rash.  Neurological:  Negative for syncope and headaches.   Physical Exam Updated Vital Signs BP 138/83 (BP Location: Right Arm)   Pulse (!) 126   Temp 97.7 F (36.5 C) (Oral)   Resp 16   LMP 11/19/2020 (Approximate)   SpO2 97%   Physical Exam Vitals and nursing note reviewed.  Constitutional:      General: [name] not in acute distress.    Appearance: Normal appearance.  HENT:     Head: Normocephalic and atraumatic.     Right Ear: External ear normal.     Left Ear: External ear normal.     Nose: Nose normal.     Mouth/Throat:     Mouth: Mucous membranes are moist.  Eyes:     General: No scleral icterus.       Right eye: No discharge.        Left eye: No discharge.  Cardiovascular:     Rate and Rhythm: Normal rate and regular rhythm.     Pulses: Normal pulses.     Heart  sounds: Normal heart sounds.  Pulmonary:     Effort: Pulmonary effort is normal. No respiratory distress.     Breath sounds: Normal breath sounds.  Abdominal:     General: Abdomen is flat.     Palpations: Abdomen is soft.     Tenderness: There is abdominal tenderness in the epigastric area.     Comments: Nonsurgical abdomen, not peritoneal.  Mild tenderness palpation epigastrium  Musculoskeletal:        General: Normal range of motion.     Cervical back: Normal range of motion.     Right lower leg: No edema.     Left lower leg: No edema.  Skin:    General: Skin is warm and dry.     Capillary Refill: Capillary refill takes less than 2 seconds.  Neurological:     Mental Status: [name] alert.  Psychiatric:        Mood and Affect: Mood normal.        Behavior: Behavior normal.    ED Results / Procedures / Treatments   Labs (all labs ordered are listed, but only abnormal results are displayed) Labs Reviewed  COMPREHENSIVE METABOLIC PANEL - Abnormal; Notable for the following components:      Result Value   Sodium 132 (*)    Potassium 6.2 (*)    CO2 17 (*)    Glucose, Bld 352 (*)    BUN 34 (*)    Creatinine, Ser 1.38 (*)    Calcium 8.5 (*)    AST 101 (*)    ALT 74 (*)    Total Bilirubin 3.0 (*)    GFR, Estimated 53 (*)    All other components within normal limits  CBC WITH DIFFERENTIAL/PLATELET - Abnormal; Notable for the following components:   Hemoglobin 11.0 (*)    HCT 33.8 (*)    RDW 15.9 (*)    nRBC 0.5 (*)    All other components within normal limits  BLOOD GAS, VENOUS - Abnormal; Notable for the following components:   pCO2, Ven 41.1 (*)    pO2, Ven 61.7 (*)  Bicarbonate 19.6 (*)    All other components within normal limits  URINALYSIS, ROUTINE W REFLEX MICROSCOPIC - Abnormal; Notable for the following components:   APPearance HAZY (*)    Glucose, UA 50 (*)    Protein, ur 30 (*)    Leukocytes,Ua SMALL (*)    Bacteria, UA RARE (*)    All other components  within normal limits  LACTIC ACID, PLASMA - Abnormal; Notable for the following components:   Lactic Acid, Venous 2.9 (*)    All other components within normal limits  LACTIC ACID, PLASMA - Abnormal; Notable for the following components:   Lactic Acid, Venous 2.9 (*)    All other components within normal limits  LACTIC ACID, PLASMA - Abnormal; Notable for the following components:   Lactic Acid, Venous 2.0 (*)    All other components within normal limits  I-STAT CHEM 8, ED - Abnormal; Notable for the following components:   BUN 31 (*)    Glucose, Bld 182 (*)    Hemoglobin 10.5 (*)    HCT 31.0 (*)    All other components within normal limits  RESP PANEL BY RT-PCR (FLU A&B, COVID) ARPGX2  LIPASE, BLOOD  SEDIMENTATION RATE  C-REACTIVE PROTEIN  I-STAT BETA HCG BLOOD, ED ([facility], [facility], [facility] ONLY)    EKG EKG Interpretation  Date/Time:  Sunday November 22 2020 19:49:50 EDT Ventricular Rate:  107 PR Interval:  131 QRS Duration: 89 QT Interval:  370 QTC Calculation: 494 R Axis:   22 Text Interpretation: Sinus tachycardia Borderline prolonged QT interval similar to prior ECG Confirmed by Wynona Dove ([id number]) on 11/22/2020 10:50:41 PM  Radiology DG Chest 1 View  Result Date: 11/23/2020 CLINICAL DATA:  Abdominal pain, vomiting and diarrhea. EXAM: CHEST  1 VIEW COMPARISON:  October 07, 2020 FINDINGS: There is no evidence of acute infiltrate, pleural effusion or pneumothorax. The heart size and mediastinal contours are within normal limits. The visualized skeletal structures are unremarkable. IMPRESSION: No active cardiopulmonary disease. Electronically Signed   By: Virgina Norfolk M.D.   On: 11/23/2020 01:55   CT ABDOMEN PELVIS W CONTRAST  Result Date: 11/22/2020 CLINICAL DATA:  Epigastric pain hx recurrent pancreatitis EXAM: CT ABDOMEN AND PELVIS WITH CONTRAST TECHNIQUE: Multidetector CT imaging of the abdomen and pelvis was performed using the standard protocol following bolus administration of  intravenous contrast. CONTRAST:  75m OMNIPAQUE IOHEXOL 350 MG/ML SOLN COMPARISON:  CT abdomen pelvis 04/18/2020 FINDINGS: Lower chest: Bilateral lower lobe subsegmental atelectasis. Hepatobiliary: Interval increase in size of the liver now measuring 21 cm (from 18.5 cm). The hepatic parenchyma is diffusely hypodense compared to the splenic parenchyma consistent with fatty infiltration. No focal liver abnormality. No gallstones, gallbladder wall thickening, or pericholecystic fluid. No biliary dilatation. Pancreas: No focal lesion. Normal pancreatic contour. No surrounding inflammatory changes. No main pancreatic ductal dilatation. Spleen: Interval development of a mildly enlarged spleen measuring up to 13.5 cm. No focal abnormality. Adrenals/Urinary Tract: No adrenal nodule bilaterally. Bilateral kidneys enhance symmetrically. No hydronephrosis. No hydroureter. The urinary bladder is unremarkable. Stomach/Bowel: Stomach is within normal limits. No evidence of bowel wall thickening or dilatation. Appendix appears normal. Vascular/Lymphatic: No abdominal aorta or iliac aneurysm. Similar-appearing mild circumferential hypodensity along the abdominal aorta and bilateral common iliac arteries. No abdominal, pelvic, or inguinal lymphadenopathy. Reproductive: Atrophic uterus. Otherwise uterus and bilateral adnexa are unremarkable. Other: No intraperitoneal free fluid. No intraperitoneal free gas. No organized fluid collection. Musculoskeletal: No abdominal wall hernia or abnormality. No suspicious lytic or blastic osseous  lesions. No acute displaced fracture. Multilevel degenerative changes of the spine. IMPRESSION: 1. No CT findings to suggest acute pancreatitis. 2. Interval increase in hepatomegaly. 3. Hepatic steatosis. 4. Interval development of mild splenomegaly. 5. Atrophic uterus. 6. Similar-appearing mild circumferential hypodensity along the abdominal aorta and bilateral common iliac arteries. Finding may  represent intimal thickening in the setting possible vasculitis. Electronically Signed   By: Iven Finn M.D.   On: 11/22/2020 23:06    Procedures Procedures   Medications Ordered in ED Medications  fentaNYL (SUBLIMAZE) injection 50 mcg (has no administration in time range)  famotidine (PEPCID) IVPB 20 mg premix (0 mg Intravenous Stopped 11/22/20 2049)  ondansetron (ZOFRAN) injection 4 mg (4 mg Intravenous Given 11/22/20 2020)  HYDROmorphone (DILAUDID) injection 2 mg (2 mg Intravenous Given 11/22/20 2021)  sodium chloride 0.9 % bolus 1,000 mL (0 mLs Intravenous Stopped 11/22/20 2248)  diphenhydrAMINE (BENADRYL) injection 25 mg (25 mg Intravenous Given 11/22/20 2050)  iohexol (OMNIPAQUE) 350 MG/ML injection 80 mL (80 mLs Intravenous Contrast Given 11/22/20 2239)  sodium chloride 0.9 % bolus 1,000 mL (0 mLs Intravenous Stopped 11/23/20 0052)  HYDROmorphone (DILAUDID) injection 1 mg (1 mg Intravenous Given 11/23/20 0017)    ED Course  I have reviewed the triage vital signs and the nursing notes.  Pertinent labs & imaging results that were available during my care of the patient were reviewed by me and considered in my medical decision making (see chart for details).    MDM Rules/Calculators/A&P                           This patient complains of abdominal pain, nausea, diarrhea; this involves an extensive number of treatment Options and is a complaint that carries with it a high risk of complications and Morbidity. Abdomen is not peritoneal. Mild TTP to mid-epigastrium. No ETOH use. Vital signs reviewed and are stable. Serious etiologies considered.   Labs reviewed, potassium initially elevated on BMP however this was hemolyzed.  Repeat potassium is within normal limits.  Repeat creatinine is also within normal notes.  Patient was given IV fluids she did report reduced oral intake over the past 24 hours, lipase is not elevated.  CT abdomen pelvis with IV contrast no evidence of acute  pancreatitis.  Radiologist did mention concern for possible vasculitis. Abnormality similar to that shown on prior CT.  She has prior CT with atherosclerosis of distal abdominal aorta at this approximate location.  She has equal pulses in all 4 extremities. She has no URI symptoms. No rashes or purpura. She has no large aneurysm, new murmur, or other concerning features. MRA or CTA may be appropriate in outpatient setting to further characterize this. May benefit from vascular surgery eval. .    Her HR is chronically elevated, has not taken her night time medications. She has no chest pain or dyspnea, no palpitations. Similar HR on prior evaluations.   Patient with diarrhea over last few days, elevated HR. She was initially started on IVF, she received approximately 1.4 L of NS. She has HFrEF. No dyspnea or chest pain. Will stop further fluids at this time as Hr is improving, LA improving. She has no dyspnea. [name] able to tolerate oral intake.    Pt signed out at this time pending final disposition.    Final Clinical Impression(s) / ED Diagnoses Final diagnoses:  Epigastric pain  Diarrhea, unspecified type  Abnormal CT scan    Rx / DC  Orders ED Discharge Orders     None        Jeanell Sparrow, DO 11/22/20 2349    Jeanell Sparrow, DO 11/23/20 0201

## 2020-11-22 NOTE — ED Triage Notes (Signed)
Complains of abdominal pain since last Monday, complains of emesis and diarrhea as well. Last emesis this morning at 3am, diarrhea 15 min ago. Reports bright red blood in diarrhea, hx of pancreatitis. Pain worsens with food ingestion, causing emesis.

## 2020-11-23 ENCOUNTER — Encounter (HOSPITAL_COMMUNITY): Payer: Self-pay | Admitting: Family Medicine

## 2020-11-23 ENCOUNTER — Emergency Department (HOSPITAL_COMMUNITY): Payer: BC Managed Care – PPO

## 2020-11-23 DIAGNOSIS — I10 Essential (primary) hypertension: Secondary | ICD-10-CM

## 2020-11-23 DIAGNOSIS — Q613 Polycystic kidney, unspecified: Secondary | ICD-10-CM | POA: Diagnosis not present

## 2020-11-23 DIAGNOSIS — E1065 Type 1 diabetes mellitus with hyperglycemia: Secondary | ICD-10-CM | POA: Diagnosis present

## 2020-11-23 DIAGNOSIS — E786 Lipoprotein deficiency: Secondary | ICD-10-CM | POA: Diagnosis present

## 2020-11-23 DIAGNOSIS — I776 Arteritis, unspecified: Principal | ICD-10-CM

## 2020-11-23 DIAGNOSIS — I083 Combined rheumatic disorders of mitral, aortic and tricuspid valves: Secondary | ICD-10-CM | POA: Diagnosis present

## 2020-11-23 DIAGNOSIS — I110 Hypertensive heart disease with heart failure: Secondary | ICD-10-CM | POA: Diagnosis present

## 2020-11-23 DIAGNOSIS — I428 Other cardiomyopathies: Secondary | ICD-10-CM | POA: Diagnosis present

## 2020-11-23 DIAGNOSIS — I959 Hypotension, unspecified: Secondary | ICD-10-CM | POA: Diagnosis not present

## 2020-11-23 DIAGNOSIS — R935 Abnormal findings on diagnostic imaging of other abdominal regions, including retroperitoneum: Secondary | ICD-10-CM | POA: Diagnosis not present

## 2020-11-23 DIAGNOSIS — I5041 Acute combined systolic (congestive) and diastolic (congestive) heart failure: Secondary | ICD-10-CM | POA: Diagnosis not present

## 2020-11-23 DIAGNOSIS — I5022 Chronic systolic (congestive) heart failure: Secondary | ICD-10-CM

## 2020-11-23 DIAGNOSIS — K3184 Gastroparesis: Secondary | ICD-10-CM | POA: Diagnosis present

## 2020-11-23 DIAGNOSIS — E871 Hypo-osmolality and hyponatremia: Secondary | ICD-10-CM | POA: Diagnosis present

## 2020-11-23 DIAGNOSIS — I5043 Acute on chronic combined systolic (congestive) and diastolic (congestive) heart failure: Secondary | ICD-10-CM | POA: Diagnosis present

## 2020-11-23 DIAGNOSIS — I951 Orthostatic hypotension: Secondary | ICD-10-CM | POA: Diagnosis not present

## 2020-11-23 DIAGNOSIS — K7200 Acute and subacute hepatic failure without coma: Secondary | ICD-10-CM | POA: Diagnosis present

## 2020-11-23 DIAGNOSIS — E1043 Type 1 diabetes mellitus with diabetic autonomic (poly)neuropathy: Secondary | ICD-10-CM | POA: Diagnosis present

## 2020-11-23 DIAGNOSIS — R9431 Abnormal electrocardiogram [ECG] [EKG]: Secondary | ICD-10-CM

## 2020-11-23 DIAGNOSIS — I509 Heart failure, unspecified: Secondary | ICD-10-CM | POA: Diagnosis not present

## 2020-11-23 DIAGNOSIS — R109 Unspecified abdominal pain: Secondary | ICD-10-CM | POA: Diagnosis not present

## 2020-11-23 DIAGNOSIS — R197 Diarrhea, unspecified: Secondary | ICD-10-CM | POA: Diagnosis not present

## 2020-11-23 DIAGNOSIS — K861 Other chronic pancreatitis: Secondary | ICD-10-CM | POA: Diagnosis present

## 2020-11-23 DIAGNOSIS — R570 Cardiogenic shock: Secondary | ICD-10-CM | POA: Diagnosis not present

## 2020-11-23 DIAGNOSIS — E781 Pure hyperglyceridemia: Secondary | ICD-10-CM | POA: Diagnosis not present

## 2020-11-23 DIAGNOSIS — E1022 Type 1 diabetes mellitus with diabetic chronic kidney disease: Secondary | ICD-10-CM | POA: Diagnosis present

## 2020-11-23 DIAGNOSIS — R748 Abnormal levels of other serum enzymes: Secondary | ICD-10-CM | POA: Diagnosis not present

## 2020-11-23 DIAGNOSIS — E1040 Type 1 diabetes mellitus with diabetic neuropathy, unspecified: Secondary | ICD-10-CM | POA: Diagnosis present

## 2020-11-23 DIAGNOSIS — K760 Fatty (change of) liver, not elsewhere classified: Secondary | ICD-10-CM | POA: Diagnosis present

## 2020-11-23 DIAGNOSIS — E872 Acidosis: Secondary | ICD-10-CM | POA: Diagnosis present

## 2020-11-23 DIAGNOSIS — I130 Hypertensive heart and chronic kidney disease with heart failure and stage 1 through stage 4 chronic kidney disease, or unspecified chronic kidney disease: Secondary | ICD-10-CM | POA: Diagnosis present

## 2020-11-23 DIAGNOSIS — Z20822 Contact with and (suspected) exposure to covid-19: Secondary | ICD-10-CM | POA: Diagnosis present

## 2020-11-23 DIAGNOSIS — E039 Hypothyroidism, unspecified: Secondary | ICD-10-CM | POA: Diagnosis present

## 2020-11-23 DIAGNOSIS — D649 Anemia, unspecified: Secondary | ICD-10-CM | POA: Diagnosis present

## 2020-11-23 DIAGNOSIS — N179 Acute kidney failure, unspecified: Secondary | ICD-10-CM | POA: Diagnosis not present

## 2020-11-23 DIAGNOSIS — K858 Other acute pancreatitis without necrosis or infection: Secondary | ICD-10-CM | POA: Diagnosis present

## 2020-11-23 DIAGNOSIS — E1059 Type 1 diabetes mellitus with other circulatory complications: Secondary | ICD-10-CM | POA: Diagnosis not present

## 2020-11-23 LAB — BASIC METABOLIC PANEL
Anion gap: 8 (ref 5–15)
BUN: 25 mg/dL — ABNORMAL HIGH (ref 6–20)
CO2: 15 mmol/L — ABNORMAL LOW (ref 22–32)
Calcium: 7.4 mg/dL — ABNORMAL LOW (ref 8.9–10.3)
Chloride: 105 mmol/L (ref 98–111)
Creatinine, Ser: 0.49 mg/dL (ref 0.44–1.00)
GFR, Estimated: >60 mL/min (ref >60)
Glucose, Bld: 207 mg/dL — ABNORMAL HIGH (ref 70–99)
Potassium: 5.3 mmol/L — ABNORMAL HIGH (ref 3.5–5.1)
Sodium: 128 mmol/L — ABNORMAL LOW (ref 135–145)

## 2020-11-23 LAB — RESP PANEL BY RT-PCR (FLU A&B, COVID) ARPGX2
Influenza A by PCR: NEGATIVE
Influenza B by PCR: NEGATIVE
SARS Coronavirus 2 by RT PCR: NEGATIVE

## 2020-11-23 LAB — C-REACTIVE PROTEIN: CRP: 0.7 mg/dL (ref <1.0)

## 2020-11-23 LAB — CBC
HCT: 31.9 % — ABNORMAL LOW (ref 36.0–46.0)
Hemoglobin: 11.6 g/dL — ABNORMAL LOW (ref 12.0–15.0)
MCH: 31.8 pg (ref 26.0–34.0)
MCHC: 36.4 g/dL — ABNORMAL HIGH (ref 30.0–36.0)
MCV: 87.4 fL (ref 80.0–100.0)
Platelets: 176 10*3/uL (ref 150–400)
RBC: 3.65 MIL/uL — ABNORMAL LOW (ref 3.87–5.11)
RDW: 15.8 % — ABNORMAL HIGH (ref 11.5–15.5)
WBC: 8.1 10*3/uL (ref 4.0–10.5)
nRBC: 0 % (ref 0.0–0.2)

## 2020-11-23 LAB — HEMOGLOBIN A1C
Hgb A1c MFr Bld: 7.8 % — ABNORMAL HIGH (ref 4.8–5.6)
Mean Plasma Glucose: 177.16 mg/dL

## 2020-11-23 LAB — CBG MONITORING, ED
Glucose-Capillary: 344 mg/dL — ABNORMAL HIGH (ref 70–99)
Glucose-Capillary: 275 mg/dL — ABNORMAL HIGH (ref 70–99)

## 2020-11-23 LAB — HIV ANTIBODY (ROUTINE TESTING W REFLEX): HIV Screen 4th Generation wRfx: NONREACTIVE

## 2020-11-23 LAB — SEDIMENTATION RATE: Sed Rate: 29 mm/hr — ABNORMAL HIGH (ref 0–22)

## 2020-11-23 LAB — GLUCOSE, CAPILLARY: Glucose-Capillary: 134 mg/dL — ABNORMAL HIGH (ref 70–99)

## 2020-11-23 LAB — BRAIN NATRIURETIC PEPTIDE: B Natriuretic Peptide: 48.9 pg/mL (ref 0.0–100.0)

## 2020-11-23 LAB — LACTIC ACID, PLASMA: Lactic Acid, Venous: 2 mmol/L (ref 0.5–1.9)

## 2020-11-23 MED ORDER — LEVOTHYROXINE SODIUM 75 MCG PO TABS
150.0000 ug | ORAL_TABLET | Freq: Every day | ORAL | Status: DC
  Administered 2020-11-23 – 2020-11-28 (×6): 150 ug via ORAL
  Filled 2020-11-23: qty 1
  Filled 2020-11-23: qty 2
  Filled 2020-11-23 (×4): qty 1

## 2020-11-23 MED ORDER — ROSUVASTATIN CALCIUM 20 MG PO TABS
40.0000 mg | ORAL_TABLET | Freq: Every day | ORAL | Status: DC
  Administered 2020-11-23 – 2020-11-28 (×6): 40 mg via ORAL
  Filled 2020-11-23 (×6): qty 2

## 2020-11-23 MED ORDER — DIPHENHYDRAMINE HCL 50 MG/ML IJ SOLN
25.0000 mg | Freq: Once | INTRAMUSCULAR | Status: AC
  Administered 2020-11-23: 25 mg via INTRAVENOUS
  Filled 2020-11-23: qty 1

## 2020-11-23 MED ORDER — TRIMETHOBENZAMIDE HCL 100 MG/ML IM SOLN
200.0000 mg | Freq: Three times a day (TID) | INTRAMUSCULAR | Status: DC | PRN
Start: 2020-11-23 — End: 2020-11-23
  Filled 2020-11-23: qty 2

## 2020-11-23 MED ORDER — DULOXETINE HCL 60 MG PO CPEP
60.0000 mg | ORAL_CAPSULE | Freq: Every day | ORAL | Status: DC
  Administered 2020-11-23 – 2020-12-06 (×14): 60 mg via ORAL
  Filled 2020-11-23: qty 1
  Filled 2020-11-23: qty 2
  Filled 2020-11-23: qty 1
  Filled 2020-11-23: qty 2
  Filled 2020-11-23 (×10): qty 1

## 2020-11-23 MED ORDER — FENTANYL CITRATE PF 50 MCG/ML IJ SOSY
50.0000 ug | PREFILLED_SYRINGE | Freq: Once | INTRAMUSCULAR | Status: AC
  Administered 2020-11-23: 50 ug via INTRAVENOUS
  Filled 2020-11-23: qty 1

## 2020-11-23 MED ORDER — INSULIN DETEMIR 100 UNIT/ML ~~LOC~~ SOLN
25.0000 [IU] | Freq: Two times a day (BID) | SUBCUTANEOUS | Status: DC
  Administered 2020-11-23 – 2020-11-27 (×9): 25 [IU] via SUBCUTANEOUS
  Filled 2020-11-23 (×10): qty 0.25

## 2020-11-23 MED ORDER — FAMOTIDINE 20 MG PO TABS
20.0000 mg | ORAL_TABLET | Freq: Two times a day (BID) | ORAL | Status: DC
  Administered 2020-11-23 – 2020-11-26 (×7): 20 mg via ORAL
  Filled 2020-11-23 (×7): qty 1

## 2020-11-23 MED ORDER — ACETAMINOPHEN 325 MG PO TABS
650.0000 mg | ORAL_TABLET | Freq: Four times a day (QID) | ORAL | Status: DC | PRN
  Administered 2020-11-24: 650 mg via ORAL
  Filled 2020-11-23: qty 2

## 2020-11-23 MED ORDER — HYDROMORPHONE HCL 1 MG/ML IJ SOLN
1.0000 mg | INTRAMUSCULAR | Status: AC | PRN
  Administered 2020-11-23 (×3): 1 mg via INTRAVENOUS
  Filled 2020-11-23 (×3): qty 1

## 2020-11-23 MED ORDER — SODIUM CHLORIDE 0.9 % IV SOLN: 250.0000 mL | INTRAVENOUS | Status: DC | PRN

## 2020-11-23 MED ORDER — METOPROLOL SUCCINATE ER 100 MG PO TB24
200.0000 mg | ORAL_TABLET | Freq: Every day | ORAL | Status: DC
  Administered 2020-11-23 – 2020-11-27 (×5): 200 mg via ORAL
  Filled 2020-11-23 (×3): qty 2
  Filled 2020-11-23: qty 4
  Filled 2020-11-23 (×2): qty 2

## 2020-11-23 MED ORDER — METOPROLOL SUCCINATE ER 50 MG PO TB24
50.0000 mg | ORAL_TABLET | Freq: Every day | ORAL | Status: DC
  Administered 2020-11-23 – 2020-11-27 (×5): 50 mg via ORAL
  Filled 2020-11-23 (×6): qty 1

## 2020-11-23 MED ORDER — SACUBITRIL-VALSARTAN 24-26 MG PO TABS
1.0000 | ORAL_TABLET | Freq: Two times a day (BID) | ORAL | Status: DC
  Administered 2020-11-24 – 2020-11-27 (×7): 1 via ORAL
  Filled 2020-11-23 (×7): qty 1

## 2020-11-23 MED ORDER — SODIUM CHLORIDE 0.9% FLUSH
3.0000 mL | Freq: Two times a day (BID) | INTRAVENOUS | Status: DC
  Administered 2020-11-23 – 2020-12-05 (×19): 3 mL via INTRAVENOUS

## 2020-11-23 MED ORDER — SODIUM CHLORIDE 0.9 % IV SOLN
8.0000 mg | Freq: Four times a day (QID) | INTRAVENOUS | Status: DC | PRN
  Administered 2020-11-26 – 2020-11-27 (×2): 8 mg via INTRAVENOUS
  Filled 2020-11-23 (×5): qty 4

## 2020-11-23 MED ORDER — ENOXAPARIN SODIUM 40 MG/0.4ML IJ SOSY
40.0000 mg | PREFILLED_SYRINGE | INTRAMUSCULAR | Status: DC
  Administered 2020-11-23 – 2020-11-25 (×3): 40 mg via SUBCUTANEOUS
  Filled 2020-11-23 (×3): qty 0.4

## 2020-11-23 MED ORDER — NORETHINDRONE-ETH ESTRADIOL 0.4-35 MG-MCG PO TABS
1.0000 | ORAL_TABLET | Freq: Every day | ORAL | Status: DC
  Administered 2020-11-23 – 2020-12-04 (×2): 1 via ORAL

## 2020-11-23 MED ORDER — SODIUM CHLORIDE 0.9% FLUSH: 3.0000 mL | INTRAVENOUS | Status: DC | PRN

## 2020-11-23 MED ORDER — SACUBITRIL-VALSARTAN 24-26 MG PO TABS
1.0000 | ORAL_TABLET | Freq: Two times a day (BID) | ORAL | Status: DC
  Administered 2020-11-23: 1 via ORAL
  Filled 2020-11-23: qty 1

## 2020-11-23 MED ORDER — INSULIN ASPART 100 UNIT/ML IJ SOLN
0.0000 [IU] | Freq: Every day | INTRAMUSCULAR | Status: DC
  Administered 2020-11-24: 2 [IU] via SUBCUTANEOUS
  Administered 2020-11-26: 3 [IU] via SUBCUTANEOUS
  Filled 2020-11-23: qty 0.05

## 2020-11-23 MED ORDER — PREGABALIN 75 MG PO CAPS
300.0000 mg | ORAL_CAPSULE | Freq: Two times a day (BID) | ORAL | Status: DC
  Administered 2020-11-23 – 2020-11-26 (×7): 300 mg via ORAL
  Filled 2020-11-23 (×5): qty 4
  Filled 2020-11-23: qty 6
  Filled 2020-11-23: qty 4

## 2020-11-23 MED ORDER — ACETAMINOPHEN 650 MG RE SUPP: 650.0000 mg | Freq: Four times a day (QID) | RECTAL | Status: DC | PRN

## 2020-11-23 MED ORDER — FENOFIBRATE 160 MG PO TABS
160.0000 mg | ORAL_TABLET | Freq: Every day | ORAL | Status: DC
  Administered 2020-11-23 – 2020-12-06 (×14): 160 mg via ORAL
  Filled 2020-11-23 (×14): qty 1

## 2020-11-23 MED ORDER — INSULIN ASPART 100 UNIT/ML IJ SOLN
0.0000 [IU] | Freq: Three times a day (TID) | INTRAMUSCULAR | Status: DC
  Administered 2020-11-23: 8 [IU] via SUBCUTANEOUS
  Administered 2020-11-23: 11 [IU] via SUBCUTANEOUS
  Administered 2020-11-23: 5 [IU] via SUBCUTANEOUS
  Administered 2020-11-24: 3 [IU] via SUBCUTANEOUS
  Administered 2020-11-24: 2 [IU] via SUBCUTANEOUS
  Administered 2020-11-24 – 2020-11-25 (×2): 3 [IU] via SUBCUTANEOUS
  Administered 2020-11-25 (×2): 5 [IU] via SUBCUTANEOUS
  Administered 2020-11-26: 8 [IU] via SUBCUTANEOUS
  Administered 2020-11-26: 3 [IU] via SUBCUTANEOUS
  Administered 2020-11-26 – 2020-11-27 (×2): 5 [IU] via SUBCUTANEOUS
  Administered 2020-11-27: 3 [IU] via SUBCUTANEOUS
  Administered 2020-11-27: 5 [IU] via SUBCUTANEOUS
  Filled 2020-11-23: qty 0.15

## 2020-11-23 MED ORDER — SENNOSIDES-DOCUSATE SODIUM 8.6-50 MG PO TABS: 1.0000 | ORAL_TABLET | Freq: Every evening | ORAL | Status: DC | PRN

## 2020-11-23 MED ORDER — LEVOTHYROXINE SODIUM 50 MCG PO TABS: 150.0000 ug | ORAL_TABLET | Freq: Every day | ORAL | Status: DC

## 2020-11-23 MED ORDER — DEXAMETHASONE SODIUM PHOSPHATE 10 MG/ML IJ SOLN
10.0000 mg | Freq: Once | INTRAMUSCULAR | Status: AC
  Administered 2020-11-23: 10 mg via INTRAVENOUS
  Filled 2020-11-23: qty 1

## 2020-11-23 MED ORDER — FUROSEMIDE 20 MG PO TABS
20.0000 mg | ORAL_TABLET | Freq: Every day | ORAL | Status: DC
  Administered 2020-11-23 – 2020-11-25 (×3): 20 mg via ORAL
  Filled 2020-11-23 (×4): qty 1

## 2020-11-23 MED ORDER — DIPHENHYDRAMINE HCL 25 MG PO CAPS
25.0000 mg | ORAL_CAPSULE | Freq: Four times a day (QID) | ORAL | Status: DC | PRN
  Administered 2020-12-01: 25 mg via ORAL
  Filled 2020-11-23 (×3): qty 1

## 2020-11-23 NOTE — H&P (Signed)
History and Physical    Allayna Erlich JWJ:[id number] DOB: 1990-08-09 DOA: 11/22/2020  PCP: Sue Lush, PA-C   Patient coming from: Home  Chief Complaint: abdominal pain, diarrhea  HPI: Jennifer [name] is a 30 y.o. 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  She reports has been having abdominal pain for the last 2 weeks that is progressively worsened and she started getting diarrhea last week.  She has not had any blood in the stool.  She reports she has not had vomiting and has had intermittent nausea.  the pain is in the epigastric region and is a sharp 6 stabbing burning type pain that does not radiate.  She says similar to when she had pancreatitis in the past but not as severe.  She reports that she has been taking her home Creon as prescribed.  She has had decreased p.o. intake secondary to the abdominal pain.  She did see gastroenterology a year ago and had an EGD performed at that time he reports was normal.  [name] followed yearly with neurosurgery for the astrocytoma.  She did have chemotherapy as a child to try and strength the tumor as it could not be resected she states.  The chemotherapy did cause some damage to her heart causing CHF.  Reports her blood sugar has been controlled.  [name] followed by endocrinology and cardiology for her diabetes and hypertriglyceridemia.  ED Course: Ms. Connaughton has had tachycardia in the ER with normal blood pressure.  Has no respiratory distress.  [name] maintaining oxygen saturation in the 94 to 97% range on room air.  Was given fluids in the emergency room but continues to have tachycardia.  No signs of pancreatitis on CT of her abdomen.  There is an area in the abdominal aorta and bilateral iliac arteries suggestive of vasculitis per radiology report.  Lipase level is normal.  Lactic acid was 2.9 on 2  occasions in the emergency room and a third level is 2.0.  CBC is unremarkable.  Sodium 135 potassium 4.4 chloride 110 creatinine 1.0 BUN 31 glucose 182 ionized calcium 1.15.  Lipase is 29, alkaline phosphatase 84 AST 101 ALT 74 bilirubin 3.  Sed rate 29.  CRP pending.  Beta-hCG negative.  19 negative.  Influenza A&B negative.  Required multiple doses of pain medication in the emergency room.  Hospitalist service been asked to admit for further management  Review of Systems:  General: Denies fever, chills, weight loss, night sweats.  Denies dizziness.  HENT: Denies head trauma, headache, denies change in hearing, tinnitus.  Denies nasal congestion or bleeding.  Denies sore throat, sores in mouth.  Denies difficulty swallowing Eyes: Denies blurry vision, pain in eye, drainage. Denies discoloration of eyes. Has chronic drooping of right upper eyelid Neck: Denies pain.  Denies swelling.  Denies pain with movement. Cardiovascular: Denies chest pain, palpitations. Denies edema. Denies orthopnea Respiratory: Denies shortness of breath, cough. Denies wheezing. Denies sputum production Gastrointestinal: Reports abdominal pain and diarrhea.  Denies nausea, vomiting.  Denies melena.  Denies hematemesis. Musculoskeletal: Denies limitation of movement.  Denies deformity or swelling.  Denies pain.  Denies arthralgias or myalgias. Genitourinary: Denies pelvic pain.  Denies urinary frequency or hesitancy.  Denies dysuria.  Skin: Denies rash.  Denies petechiae, purpura, ecchymosis. Neurological: Denies syncope. Denies seizure activity.  Denies paresthesia. Denies slurred speech, drooping face. Denies visual change. Psychiatric: Denies depression, anxiety. Denies  hallucinations.  Past Medical History:  Diagnosis Date   Astrocytoma ([place])    Brain tumor ([place]) 03/29/1995   Cholesterosis    Diabetes mellitus without complication ([place])    DM (diabetes mellitus) ([place]) 10/10/2018   Fatty liver    HTN (hypertension)  10/10/2018   Hypertension    Hypothyroidism 10/10/2018   Hypothyroidism    Lipoprotein deficiency    Lung disease    longevity long term   Pancreatitis    Polycystic ovary syndrome    Thyroid disease     Past Surgical History:  Procedure Laterality Date   ABDOMINAL SURGERY     pt states during miscarriage got her intestine   BRAIN SURGERY     EYE MUSCLE SURGERY Right 03/28/2014   VENTRICULOSTOMY  03/28/1997    Social History  reports that she has never smoked. She has never used smokeless tobacco. She reports that she does not drink alcohol and does not use drugs.  Allergies  Allergen Reactions   Ketamine    Clarithromycin Other (See Comments)    Upset stomach    Shellfish Allergy Swelling    Pt states contrast in CT is okay   Penicillins Rash    Has patient had a PCN reaction causing immediate rash, facial/tongue/throat swelling, SOB or lightheadedness with hypotension: Y Has patient had a PCN reaction causing severe rash involving mucus membranes or skin necrosis: Y Has patient had a PCN reaction that required hospitalization: N Has patient had a PCN reaction occurring within the last 10 years: Y If all of the above answers are "NO", then may proceed with Cephalosporin use.    Prednisone Rash    Family History  Problem Relation Age of Onset   Diabetes Mother    Hypertension Mother    Hyperlipidemia Mother    Thyroid disease Mother    Hypertension Father    Diabetes Father      Prior to Admission medications   Medication Sig Start Date End Date Taking? Authorizing Provider  Acetylcarnitine HCl (ACETYL L-CARNITINE) 500 MG CAPS Take 500 mg by mouth daily.   Yes [provider]  albuterol (VENTOLIN HFA) 108 (90 Base) MCG/ACT inhaler Inhale 2 puffs into the lungs as needed for wheezing or shortness of breath.  12/31/18  Yes [provider]  Coenzyme Q10 (COQ10 PO) Take 1 tablet by mouth daily.   Yes [provider]  CRANBERRY FRUIT PO  Take 1 capsule by mouth 2 (two) times a day.   Yes [provider]  Cyanocobalamin (VITAMIN B12 PO) Take 1 Dose by mouth daily.   Yes [provider]  cyclobenzaprine (FLEXERIL) 10 MG tablet Take 10 mg by mouth as needed. for muscle spams 11/19/18  Yes [provider]  dicyclomine (BENTYL) 10 MG capsule Take 10 mg by mouth 3 (three) times daily as needed for spasms.  02/11/19  Yes [provider]  DULoxetine (CYMBALTA) 30 MG capsule Take 60 mg by mouth daily.  01/28/19 11/23/20 Yes [provider]  famotidine (PEPCID) 20 MG tablet Take 20 mg by mouth 2 (two) times daily.  02/11/19  Yes [provider]  fenofibrate (TRICOR) 145 MG tablet Take 145 mg by mouth daily. 01/14/19  Yes [provider]  fluticasone furoate-vilanterol (BREO ELLIPTA) 100-25 MCG/INH AEPB Inhale 1 puff into the lungs as needed (shortness of breath).  01/21/19  Yes [provider]  furosemide (LASIX) 20 MG tablet Take 1 tablet (20 mg total) by mouth daily. 10/07/20  Yes  Deno Etienne, DO  ibuprofen (ADVIL) 600 MG tablet Take 1 tablet (600 mg total) by mouth every 6 (six) hours as needed. 10/19/18  Yes Law, Bea Graff, PA-C  Insulin Glargine, 1 Unit Dial, (TOUJEO SOLOSTAR) 300 UNIT/ML SOPN Inject 94 Units into the skin in the morning and at bedtime.   Yes [provider]  levothyroxine (SYNTHROID) 150 MCG tablet Take 150 mcg by mouth daily. 01/14/19  Yes [provider]  metoprolol (TOPROL-XL) 200 MG 24 hr tablet Take 200 mg by mouth daily.   Yes [provider]  metoprolol succinate (TOPROL-XL) 50 MG 24 hr tablet Take 50 mg by mouth daily. Take along w/ 25m Daily   Yes [provider]  norethindrone-ethinyl estradiol (OVCON-35) 0.4-35 MG-MCG tablet Take 1 tablet by mouth daily.   Yes [provider]  NOVOLOG FLEXPEN 100 UNIT/ML FlexPen Inject 60 Units into the skin 3 (three) times daily. Every meal 04/19/19  Yes [provider]  ondansetron (ZOFRAN) 4 MG tablet Take 4 mg by mouth as needed for nausea or vomiting.  12/19/18  Yes [provider]  pantoprazole (PROTONIX) 40 MG tablet Take 40 mg by mouth daily. 12/24/19  Yes [provider]  pregabalin (LYRICA) 300 MG capsule Take 300 mg by mouth 2 (two) times daily.    Yes [provider]  prochlorperazine (COMPAZINE) 10 MG tablet Take 1 tablet (10 mg total) by mouth 2 (two) times daily as needed for nausea or vomiting. 04/18/20  Yes RPattricia Boss MD  rosuvastatin (CRESTOR) 40 MG tablet Take 40 mg by mouth daily.   Yes [provider]  sacubitril-valsartan (ENTRESTO) 24-26 MG Take 1 tablet by mouth 2 (two) times daily.   Yes [provider]  ALPHA LIPOIC ACID-BIOTIN PO Take 200 mg by mouth daily.  Patient not taking: Reported on 11/23/2020    [provider]  ENTRESTO 97-103 MG TAKE ONE TABLET BY MOUTH TWICE A DAY Patient not taking: Reported on 11/23/2020 08/10/20   GAdrian Prows MD  HYDROcodone-acetaminophen (NORCO/VICODIN) 5-325 MG tablet Take 1 tablet by mouth every 6 (six) hours as needed. Patient not taking: Reported on 11/23/2020 05/10/19   WRipley Fraise MD  metoprolol tartrate (LOPRESSOR) 100 MG tablet TAKE ONE TABLET BY MOUTH TWICE A DAY Patient not taking: Reported on 11/23/2020 08/10/20   GAdrian Prows MD  nitroGLYCERIN (NITROSTAT) 0.4 MG SL tablet Place 1 tablet (0.4 mg total) under the tongue every 5 (five) minutes as needed for up to 25 days for chest pain. 08/01/19 08/26/19  GAdrian Prows MD    Physical Exam: Vitals:   11/23/20 0115 11/23/20 0130 11/23/20 0200 11/23/20 0230  BP:  138/83 138/90 (!) 142/108  Pulse: (!) 133 (!) 126 (!) 132 (!) 135  Resp:  16  17  Temp:      TempSrc:      SpO2: 96% 97% 95% 96%    Constitutional: NAD, calm, comfortable Vitals:   11/23/20 0115 11/23/20 0130 11/23/20 0200 11/23/20 0230  BP:  138/83 138/90 (!) 142/108  Pulse: (!) 133 (!) 126 (!) 132 (!) 135  Resp:   16  17  Temp:      TempSrc:      SpO2: 96% 97% 95% 96%   General: WDWN, Alert and oriented x3.  Eyes: EOMI, PERRL,conjunctivae normal. Sclera nonicteric HENT:  [place]/AT, external ears normal.  Nares patent without epistasis. Mucous membranes are moist Neck: Soft, normal range of motion, supple, no masses, Trachea midline Respiratory: clear to  auscultation bilaterally, no wheezing, no crackles. Normal respiratory effort. No accessory muscle use.  Cardiovascular: Regular rhythm, tachycardia, no murmurs / rubs / gallops. No extremity edema.  Abdomen: Soft, Has epigastric tenderness, nondistended, no rebound or guarding. No masses palpated. Bowel sounds normoactive Musculoskeletal: FROM. no cyanosis. No joint deformity upper and lower extremities. Has contracture left hand/wrist. Normal muscle tone.  Skin: Warm, dry, intact no rashes, lesions, ulcers. No induration Neurologic: CN 2-12 grossly intact.  Normal speech.  Sensation intact touch. Chronic left sided weakness   Psychiatric: Normal judgment and insight. Normal mood.    Labs on Admission: I have personally reviewed following labs and imaging studies  CBC: Recent Labs  Lab 11/22/20 1843 11/22/20 2303  WBC 6.7  --   NEUTROABS 4.4  --   HGB 11.0* 10.5*  HCT 33.8* 31.0*  MCV 87.1  --   PLT 170  --     Basic Metabolic Panel: Recent Labs  Lab 11/22/20 1843 11/22/20 2303  NA 132* 135  K 6.2* 4.4  CL 105 110  CO2 17*  --   GLUCOSE 352* 182*  BUN 34* 31*  CREATININE 1.38* 1.00  CALCIUM 8.5*  --     GFR: CrCl cannot be calculated (Unknown ideal weight.).  Liver Function Tests: Recent Labs  Lab 11/22/20 1843  AST 101*  ALT 74*  ALKPHOS 84  BILITOT 3.0*  PROT RESULTS UNAVAILABLE DUE TO INTERFERING SUBSTANCE  ALBUMIN 3.7    Urine analysis:    Component Value Date/Time   COLORURINE YELLOW 11/22/2020 1734   APPEARANCEUR HAZY (A) 11/22/2020 1734   LABSPEC 1.015 11/22/2020 1734   PHURINE 5.0 11/22/2020 1734    GLUCOSEU 50 (A) 11/22/2020 1734   HGBUR NEGATIVE 11/22/2020 [place] 11/22/2020 1734   [place] 11/22/2020 1734   PROTEINUR 30 (A) 11/22/2020 1734   NITRITE NEGATIVE 11/22/2020 1734   LEUKOCYTESUR SMALL (A) 11/22/2020 1734    Radiological Exams on Admission: DG Chest 1 View  Result Date: 11/23/2020 CLINICAL DATA:  Abdominal pain, vomiting and diarrhea. EXAM: CHEST  1 VIEW COMPARISON:  October 07, 2020 FINDINGS: There is no evidence of acute infiltrate, pleural effusion or pneumothorax. The heart size and mediastinal contours are within normal limits. The visualized skeletal structures are unremarkable. IMPRESSION: No active cardiopulmonary disease. Electronically Signed   By: Virgina Norfolk M.D.   On: 11/23/2020 01:55   CT ABDOMEN PELVIS W CONTRAST  Result Date: 11/22/2020 CLINICAL DATA:  Epigastric pain hx recurrent pancreatitis EXAM: CT ABDOMEN AND PELVIS WITH CONTRAST TECHNIQUE: Multidetector CT imaging of the abdomen and pelvis was performed using the standard protocol following bolus administration of intravenous contrast. CONTRAST:  69m OMNIPAQUE IOHEXOL 350 MG/ML SOLN COMPARISON:  CT abdomen pelvis 04/18/2020 FINDINGS: Lower chest: Bilateral lower lobe subsegmental atelectasis. Hepatobiliary: Interval increase in size of the liver now measuring 21 cm (from 18.5 cm). The hepatic parenchyma is diffusely hypodense compared to the splenic parenchyma consistent with fatty infiltration. No focal liver abnormality. No gallstones, gallbladder wall thickening, or pericholecystic fluid. No biliary dilatation. Pancreas: No focal lesion. Normal pancreatic contour. No surrounding inflammatory changes. No main pancreatic ductal dilatation. Spleen: Interval development of a mildly enlarged spleen measuring up to 13.5 cm. No focal abnormality. Adrenals/Urinary Tract: No adrenal nodule bilaterally. Bilateral kidneys enhance symmetrically. No hydronephrosis. No hydroureter. The  urinary bladder is unremarkable. Stomach/Bowel: Stomach is within normal limits. No evidence of bowel wall thickening or dilatation. Appendix appears normal. Vascular/Lymphatic: No abdominal aorta or iliac  aneurysm. Similar-appearing mild circumferential hypodensity along the abdominal aorta and bilateral common iliac arteries. No abdominal, pelvic, or inguinal lymphadenopathy. Reproductive: Atrophic uterus. Otherwise uterus and bilateral adnexa are unremarkable. Other: No intraperitoneal free fluid. No intraperitoneal free gas. No organized fluid collection. Musculoskeletal: No abdominal wall hernia or abnormality. No suspicious lytic or blastic osseous lesions. No acute displaced fracture. Multilevel degenerative changes of the spine. IMPRESSION: 1. No CT findings to suggest acute pancreatitis. 2. Interval increase in hepatomegaly. 3. Hepatic steatosis. 4. Interval development of mild splenomegaly. 5. Atrophic uterus. 6. Similar-appearing mild circumferential hypodensity along the abdominal aorta and bilateral common iliac arteries. Finding may represent intimal thickening in the setting possible vasculitis. Electronically Signed   By: Iven Finn M.D.   On: 11/22/2020 23:06    EKG: Independently reviewed.  EKG shows sinus tachycardia with no acute ST elevation or depression.  QTc prolonged at 494  Assessment/Plan Principal Problem:   Vasculitis Ms. Sula is admitted to telemetry floor  Has suspected vasculitis of abdominal aorta and bilateral common iliac arteries.  CRP and Sed rate ordered and pending.  Given dose of decadron in ER.  May need Rheumatology evaluation Discussed with Radiology who reviewed scan and stated that is not different than previous scan from 2021 and does not need urgent workup   Active Problems:   DM (diabetes mellitus)  Monitor blood sugar q 6 hr and provide corrective insulin as needed. Pt with decreased po intake will hold basal insulin overnight and resume it  when diet is advanced.  Check HgbA1c.     Abdominal pain No sign of pancreatitis on CT scan and normal lipase level but could be chronic pancreatitis pain which can occur with normal labs and CT findings.     Essential hypertension Continue metoprolol and entresto. Monitor BP    Hypothyroidism Continue levothyroxine    Chronic systolic CHF (congestive heart failure)  Continue entresto, lasix.     Hypertriglyceridemia Continue tricor and crestor    Prolonged QT interval Avoid medications which could further prolong QT interval.  Monitor on telemetry    DVT prophylaxis: Lovenox for DVT prophylaxis Code Status:   Full Code  Family Communication:  Diagnosis and plan discussed with pt and her husband who is at bedside. They verbalize understanding and agreement with plan. Further recommendations to follow as clinically indicated Disposition Plan:   Patient is from:  Home  Anticipated DC to:  Home  Anticipated DC date:  Anticipate 2 or more midnight stay to treat and manage acute condition   Admission status:  Inpatient    Yevonne Aline Lanisa Ishler MD [facility]  How to contact the [facility] Attending or Consulting provider [place] or covering provider during after hours [place], for this patient?   Check the care team in [facility] and look for a) attending/consulting [facility] provider listed and b) the [facility] team listed Log into [email or web address] and use [place]'s universal password to access. If you do not have the password, please contact the hospital operator. Locate the [facility] provider you are looking for under [facility] and page to a number that you can be directly reached. If you still have difficulty reaching the provider, please page the [facility] (Director on Call) for the Hospitalists listed on amion for assistance.  11/23/2020, 3:19 AM

## 2020-11-23 NOTE — Progress Notes (Signed)
PROGRESS NOTE    Patient: Jennifer [name]                            PCP: Sue Lush, PA-C                    DOB: [birth year]            DOA: 11/22/2020 GEX:[id number]             DOS: 11/23/2020, 10:15 AM   LOS: 0 days   Date of Service: The patient was seen and examined on 11/23/2020  Subjective:   The patient was seen and examined this morning. Stable at this time. Still complaining of : Abdominal pain, but improved with pain medication   Brief Narrative:    Saina Waage is a 30 y.o. 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  Reporting having abdominal pain for the past 2 weeks, progressively getting worse.  Started with diarrhea last week.  Not having any bloody bowel movements.  Not associate with any nausea or vomiting. Work-up revealed, including CT abdomen pelvis revealed vasculitis abdominal aortic and bilateral iliac arteries, no signs of pancreatitis, lipase 29, lactic acid 2.9, but no leukocytosis, afebrile, normotensive.  Mildly elevated sed rate and CRP   Assessment & Plan:   Principal Problem:   Vasculitis ([place]) Active Problems:   DM (diabetes mellitus) (HCC)   Hypothyroidism   Abdominal pain   Chronic systolic CHF (congestive heart failure) (HCC)   Essential hypertension   Hypertriglyceridemia   Prolonged QT interval     Vasculitis -Acute versus chronic -Based on CT findings and radiology discussion, also discussed with vascular surgery Dr. Donzetta Matters Finding does not appear to be acute  CT abdomen pelvis IMPRESSION: 1. No CT findings to suggest acute pancreatitis. 2. Interval increase in hepatomegaly. 3. Hepatic steatosis. 4. Interval development of mild splenomegaly. 5. Atrophic uterus. 6. Similar-appearing mild circumferential hypodensity along the abdominal aorta and bilateral common iliac arteries.  Finding may represent intimal thickening in the setting possible vasculitis.   CRP normal at 0.7, sed rate 29-mildly elevated -Lipase normal at 29, with no CT findings -Has received 1 dose of IV steroids, IV fluids -Symptoms are improving especially with pain medication -No episodes of diarrhea this morning   May need Rheumatology evaluation--could not reach any rheumatologist - they are not listed to be on-call... Outpatient follow-up  -We will continue to monitor on telemetry today closely, appreciate Dr. Donzetta Matters further evaluation and recommendations    Abdominal pain with mild lactic acidosis -Possibly due to above -Lactic acid 2.9, currently 2.0 -Improved abdominal pain, status post IV fluid resuscitation -We will monitor closely -As needed analgesics  Hyponatremia/hyperkalemia -Sodium 132 >> 128 now (will monitor closely, will modify IV fluids) -Potassium 6.2, 5.3 now ... Closely, restarting Lasix, as needed Kayexalate   Active Problems:   DM (diabetes mellitus) with hyperglycemia -Pending hyperglycemia due to steroids -A1c 7.8 -We will advance her diet slowly today -Check her CBG q. [place] with SSI coverage       Essential hypertension -Stable Continue metoprolol and entresto. Monitor BP     Hypothyroidism Continue levothyroxine     Chronic systolic CHF (congestive heart failure)  -Restarting home medication of Entresto, lasix.  -Status post gentle IV fluid hydration, monitoring closely to avoid volume overload    Hypertriglyceridemia Continue  tricor and crestor     Prolonged QT interval -Monitoring the QT interval closely -Mildly elevated potassium, will correct it -Using medication such as Zofran cautiously          ---------------------------------------------------------------------------------------------------------------------------------------- Cultures; None    Antimicrobials: None    Consultants: Vascular surgeon Dr.  Donzetta Matters   ------------------------------------------------------------------------------------------------------------------------------------------------  DVT prophylaxis:  SCD/Compression stockings and Lovenox SQ Code Status:   Code Status: Full Code  Family Communication: Alert oriented x4 discussed with patient No family member present at bedside- attempt will be made to update daily The above findings and plan of care has been discussed with patient (and family)  in detail,  they expressed understanding and agreement of above. -Advance care planning has been discussed.   Admission status:   Status is: Inpatient  Remains inpatient appropriate because:Persistent severe electrolyte disturbances, Ongoing active pain requiring inpatient pain management, and Inpatient level of care appropriate due to severity of illness  Dispo: The patient is from: Home              Anticipated d/c is to: Home 1 to 2 days once pain is more controlled, tolerating p.o.              Patient currently is not medically stable to d/c.   Difficult to place patient No      Level of care: Telemetry   Procedures:   No admission procedures for hospital encounter.    Antimicrobials:  Anti-infectives (From admission, onward)    None        Medication:   DULoxetine  60 mg Oral Daily   enoxaparin (LOVENOX) injection  40 mg Subcutaneous Q24H   famotidine  20 mg Oral BID   fenofibrate  160 mg Oral Daily   furosemide  20 mg Oral Daily   insulin aspart  0-15 Units Subcutaneous TID WC   insulin aspart  0-5 Units Subcutaneous QHS   levothyroxine  150 mcg Oral Q0600   metoprolol  200 mg Oral Daily   metoprolol succinate  50 mg Oral Daily   norethindrone-ethinyl estradiol  1 tablet Oral Daily   pregabalin  300 mg Oral BID   rosuvastatin  40 mg Oral Daily   sacubitril-valsartan  1 tablet Oral BID   sodium chloride flush  3 mL Intravenous Q12H    sodium chloride, acetaminophen **OR**  acetaminophen, HYDROmorphone (DILAUDID) injection, ondansetron (ZOFRAN) IV, senna-docusate, sodium chloride flush   Objective:   Vitals:   11/23/20 0630 11/23/20 0700 11/23/20 0800 11/23/20 1000  BP:  (!) 149/99 (!) 155/101 138/82  Pulse: (!) 117 (!) 117 (!) 114 (!) 111  Resp:  20 20 20   Temp:      TempSrc:      SpO2: 96% 97% 98% 96%  Weight:      Height:       No intake or output data in the 24 hours ending 11/23/20 1015 Filed Weights   11/23/20 0355  Weight: 55.8 kg     Examination:   Physical Exam  Constitution:  Alert, cooperative, no distress,  Appears calm and comfortable  Psychiatric: Normal and stable mood and affect, cognition intact,   HEENT: Normocephalic, PERRL, otherwise with in Normal limits  Chest:Chest symmetric Cardio vascular:  S1/S2, RRR, No murmure, No Rubs or Gallops  pulmonary: Clear to auscultation bilaterally, respirations unlabored, negative wheezes / crackles Abdomen: Soft, diffuse tenderness, negative for any rebound tenderness or guarding non-distended, bowel sounds,no masses, no organomegaly Muscular skeletal: Limited exam - in bed, able  to move all 4 extremities, Normal strength,  Neuro: CNII-XII intact. , normal motor and sensation, reflexes intact  Extremities: No pitting edema lower extremities, +2 pulses  Skin: Dry, warm to touch, negative for any Rashes, No open wounds Wounds: per nursing documentation    ------------------------------------------------------------------------------------------------------------------------------------------    LABs:  CBC Latest Ref Rng & Units 11/23/2020 11/22/2020 11/22/2020  WBC 4.0 - 10.5 K/uL 8.1 - 6.7  Hemoglobin 12.0 - 15.0 g/dL 11.6(L) 10.5(L) 11.0(L)  Hematocrit 36.0 - 46.0 % 31.9(L) 31.0(L) 33.8(L)  Platelets 150 - 400 K/uL 176 - 170   CMP Latest Ref Rng & Units 11/23/2020 11/22/2020 11/22/2020  Glucose 70 - 99 mg/dL 207(H) 182(H) 352(H)  BUN 6 - 20 mg/dL 25(H) 31(H) 34(H)  Creatinine 0.44 -  1.00 mg/dL 0.49 1.00 1.38(H)  Sodium 135 - 145 mmol/L 128(L) 135 132(L)  Potassium 3.5 - 5.1 mmol/L 5.3(H) 4.4 6.2(H)  Chloride 98 - 111 mmol/L 105 110 105  CO2 22 - 32 mmol/L 15(L) - 17(L)  Calcium 8.9 - 10.3 mg/dL 7.4(L) - 8.5(L)  Total Protein 6.5 - 8.1 g/dL - - RESULTS UNAVAILABLE DUE TO INTERFERING SUBSTANCE  Total Bilirubin 0.3 - 1.2 mg/dL - - 3.0(H)  Alkaline Phos 38 - 126 U/L - - 84  AST 15 - 41 U/L - - 101(H)  ALT 0 - 44 U/L - - 74(H)       Micro Results Recent Results (from the past 240 hour(s))  Resp Panel by RT-PCR (Flu A&B, Covid) Nasopharyngeal Swab     Status: None   Collection Time: 11/23/20  1:25 AM   Specimen: Nasopharyngeal Swab; Nasopharyngeal(NP) swabs in vial transport medium  Result Value Ref Range Status   SARS Coronavirus 2 by RT PCR NEGATIVE NEGATIVE Final    Comment: (NOTE) SARS-CoV-2 target nucleic acids are NOT DETECTED.  The SARS-CoV-2 RNA is generally detectable in upper respiratory specimens during the acute phase of infection. The lowest concentration of SARS-CoV-2 viral copies this assay can detect is 138 copies/mL. A negative result does not preclude SARS-Cov-2 infection and should not be used as the sole basis for treatment or other patient management decisions. A negative result may occur with  improper specimen collection/handling, submission of specimen other than nasopharyngeal swab, presence of viral mutation(s) within the areas targeted by this assay, and inadequate number of viral copies(<138 copies/mL). A negative result must be combined with clinical observations, patient history, and epidemiological information. The expected result is Negative.  Fact Sheet for Patients:  EntrepreneurPulse.com.au  Fact Sheet for Healthcare Providers:  IncredibleEmployment.be  This test is no t yet approved or cleared by the [place] FDA and  has been authorized for detection and/or diagnosis of  SARS-CoV-2 by FDA under an Emergency Use Authorization (EUA). This EUA will remain  in effect (meaning this test can be used) for the duration of the COVID-19 declaration under Section 564(b)(1) of the Act, 21 U.S.C.section 360bbb-3(b)(1), unless the authorization is terminated  or revoked sooner.       Influenza A by PCR NEGATIVE NEGATIVE Final   Influenza B by PCR NEGATIVE NEGATIVE Final    Comment: (NOTE) The Xpert Xpress SARS-CoV-2/FLU/RSV plus assay is intended as an aid in the diagnosis of influenza from Nasopharyngeal swab specimens and should not be used as a sole basis for treatment. Nasal washings and aspirates are unacceptable for Xpert Xpress SARS-CoV-2/FLU/RSV testing.  Fact Sheet for Patients: EntrepreneurPulse.com.au  Fact Sheet for Healthcare Providers: IncredibleEmployment.be  This test is not yet approved  or cleared by the [place] and has been authorized for detection and/or diagnosis of SARS-CoV-2 by FDA under an Emergency Use Authorization (EUA). This EUA will remain in effect (meaning this test can be used) for the duration of the COVID-19 declaration under Section 564(b)(1) of the Act, 21 U.S.C. section 360bbb-3(b)(1), unless the authorization is terminated or revoked.  Performed at [facility], [place] [address], [place], [place] [address]     Radiology Reports DG Chest 1 View  Result Date: 11/23/2020 CLINICAL DATA:  Abdominal pain, vomiting and diarrhea. EXAM: CHEST  1 VIEW COMPARISON:  October 07, 2020 FINDINGS: There is no evidence of acute infiltrate, pleural effusion or pneumothorax. The heart size and mediastinal contours are within normal limits. The visualized skeletal structures are unremarkable. IMPRESSION: No active cardiopulmonary disease. Electronically Signed   By: Virgina Norfolk M.D.   On: 11/23/2020 01:55   CT ABDOMEN PELVIS W CONTRAST  Result Date: 11/22/2020 CLINICAL  DATA:  Epigastric pain hx recurrent pancreatitis EXAM: CT ABDOMEN AND PELVIS WITH CONTRAST TECHNIQUE: Multidetector CT imaging of the abdomen and pelvis was performed using the standard protocol following bolus administration of intravenous contrast. CONTRAST:  64m OMNIPAQUE IOHEXOL 350 MG/ML SOLN COMPARISON:  CT abdomen pelvis 04/18/2020 FINDINGS: Lower chest: Bilateral lower lobe subsegmental atelectasis. Hepatobiliary: Interval increase in size of the liver now measuring 21 cm (from 18.5 cm). The hepatic parenchyma is diffusely hypodense compared to the splenic parenchyma consistent with fatty infiltration. No focal liver abnormality. No gallstones, gallbladder wall thickening, or pericholecystic fluid. No biliary dilatation. Pancreas: No focal lesion. Normal pancreatic contour. No surrounding inflammatory changes. No main pancreatic ductal dilatation. Spleen: Interval development of a mildly enlarged spleen measuring up to 13.5 cm. No focal abnormality. Adrenals/Urinary Tract: No adrenal nodule bilaterally. Bilateral kidneys enhance symmetrically. No hydronephrosis. No hydroureter. The urinary bladder is unremarkable. Stomach/Bowel: Stomach is within normal limits. No evidence of bowel wall thickening or dilatation. Appendix appears normal. Vascular/Lymphatic: No abdominal aorta or iliac aneurysm. Similar-appearing mild circumferential hypodensity along the abdominal aorta and bilateral common iliac arteries. No abdominal, pelvic, or inguinal lymphadenopathy. Reproductive: Atrophic uterus. Otherwise uterus and bilateral adnexa are unremarkable. Other: No intraperitoneal free fluid. No intraperitoneal free gas. No organized fluid collection. Musculoskeletal: No abdominal wall hernia or abnormality. No suspicious lytic or blastic osseous lesions. No acute displaced fracture. Multilevel degenerative changes of the spine. IMPRESSION: 1. No CT findings to suggest acute pancreatitis. 2. Interval increase in  hepatomegaly. 3. Hepatic steatosis. 4. Interval development of mild splenomegaly. 5. Atrophic uterus. 6. Similar-appearing mild circumferential hypodensity along the abdominal aorta and bilateral common iliac arteries. Finding may represent intimal thickening in the setting possible vasculitis. Electronically Signed   By: MIven FinnM.D.   On: 11/22/2020 23:06    SIGNED: SDeatra James MD, FHM. [facility],  Pager (please use [email or web address] to page/text) Please use Epic Secure Chat for non-urgent communication (7AM-7PM)  If 7PM-7AM, please contact night-coverage [email or web address], 11/23/2020, 10:15 AM

## 2020-11-23 NOTE — Consult Note (Signed)
Hospital Consult    Reason for Consult:  vasculitis Referring Physician:  Dr. Roger Shelter MRN #:  [id number]  History of Present Illness: This is a 30 y.o. female without significant vascular history.  [name] admitted with abdominal pain which she has had in the past.  She does have risk factors for vascular disease including diabetes.  [name] a lifelong non-smoker.  She has no history of vascular disease or family history of vascular disease.  Past Medical History:  Diagnosis Date   Astrocytoma ([place])    Brain tumor ([place]) 03/29/1995   Cholesterosis    Diabetes mellitus without complication (HCC)    DM (diabetes mellitus) ([place]) 10/10/2018   Fatty liver    HTN (hypertension) 10/10/2018   Hypertension    Hypothyroidism 10/10/2018   Hypothyroidism    Lipoprotein deficiency    Lung disease    longevity long term   Pancreatitis    Polycystic ovary syndrome    Thyroid disease     Past Surgical History:  Procedure Laterality Date   ABDOMINAL SURGERY     pt states during miscarriage got her intestine   BRAIN SURGERY     EYE MUSCLE SURGERY Right 03/28/2014   VENTRICULOSTOMY  03/28/1997    Allergies  Allergen Reactions   Ketamine    Clarithromycin Other (See Comments)    Upset stomach    Shellfish Allergy Swelling    Pt states contrast in CT is okay   Penicillins Rash    Has patient had a PCN reaction causing immediate rash, facial/tongue/throat swelling, SOB or lightheadedness with hypotension: Y Has patient had a PCN reaction causing severe rash involving mucus membranes or skin necrosis: Y Has patient had a PCN reaction that required hospitalization: N Has patient had a PCN reaction occurring within the last 10 years: Y If all of the above answers are "NO", then may proceed with Cephalosporin use.    Prednisone Rash    Prior to Admission medications   Medication Sig Start Date End Date Taking? Authorizing Provider  Acetylcarnitine HCl (ACETYL L-CARNITINE) 500 MG CAPS  Take 500 mg by mouth daily.   Yes [provider]  albuterol (VENTOLIN HFA) 108 (90 Base) MCG/ACT inhaler Inhale 2 puffs into the lungs as needed for wheezing or shortness of breath.  12/31/18  Yes [provider]  Coenzyme Q10 (COQ10 PO) Take 1 tablet by mouth daily.   Yes [provider]  CRANBERRY FRUIT PO Take 1 capsule by mouth 2 (two) times a day.   Yes [provider]  Cyanocobalamin (VITAMIN B12 PO) Take 1 Dose by mouth daily.   Yes [provider]  cyclobenzaprine (FLEXERIL) 10 MG tablet Take 10 mg by mouth as needed. for muscle spams 11/19/18  Yes [provider]  dicyclomine (BENTYL) 10 MG capsule Take 10 mg by mouth 3 (three) times daily as needed for spasms.  02/11/19  Yes [provider]  DULoxetine (CYMBALTA) 30 MG capsule Take 60 mg by mouth daily.  01/28/19 11/23/20 Yes [provider]  famotidine (PEPCID) 20 MG tablet Take 20 mg by mouth 2 (two) times daily.  02/11/19  Yes [provider]  fenofibrate (TRICOR) 145 MG tablet Take 145 mg by mouth daily. 01/14/19  Yes [provider]  fluticasone furoate-vilanterol (BREO ELLIPTA) 100-25 MCG/INH AEPB Inhale 1 puff into the lungs as needed (shortness of breath).  01/21/19  Yes [provider]  furosemide (LASIX) 20 MG tablet Take 1 tablet (20 mg total) by  mouth daily. 10/07/20  Yes Deno Etienne, DO  ibuprofen (ADVIL) 600 MG tablet Take 1 tablet (600 mg total) by mouth every 6 (six) hours as needed. 10/19/18  Yes Law, Bea Graff, PA-C  Insulin Glargine, 1 Unit Dial, (TOUJEO SOLOSTAR) 300 UNIT/ML SOPN Inject 94 Units into the skin in the morning and at bedtime.   Yes [provider]  levothyroxine (SYNTHROID) 150 MCG tablet Take 150 mcg by mouth daily. 01/14/19  Yes [provider]  metoprolol (TOPROL-XL) 200 MG 24 hr tablet Take 200 mg by mouth daily.   Yes [provider]  metoprolol succinate (TOPROL-XL) 50 MG 24 hr  tablet Take 50 mg by mouth daily. Take along w/ 269m Daily   Yes [provider]  norethindrone-ethinyl estradiol (OVCON-35) 0.4-35 MG-MCG tablet Take 1 tablet by mouth daily.   Yes [provider]  NOVOLOG FLEXPEN 100 UNIT/ML FlexPen Inject 60 Units into the skin 3 (three) times daily. Every meal 04/19/19  Yes [provider]  ondansetron (ZOFRAN) 4 MG tablet Take 4 mg by mouth as needed for nausea or vomiting.  12/19/18  Yes [provider]  pantoprazole (PROTONIX) 40 MG tablet Take 40 mg by mouth daily. 12/24/19  Yes [provider]  pregabalin (LYRICA) 300 MG capsule Take 300 mg by mouth 2 (two) times daily.    Yes [provider]  prochlorperazine (COMPAZINE) 10 MG tablet Take 1 tablet (10 mg total) by mouth 2 (two) times daily as needed for nausea or vomiting. 04/18/20  Yes RPattricia Boss MD  rosuvastatin (CRESTOR) 40 MG tablet Take 40 mg by mouth daily.   Yes [provider]  sacubitril-valsartan (ENTRESTO) 24-26 MG Take 1 tablet by mouth 2 (two) times daily.   Yes [provider]  ALPHA LIPOIC ACID-BIOTIN PO Take 200 mg by mouth daily.  Patient not taking: Reported on 11/23/2020    [provider]  ENTRESTO 97-103 MG TAKE ONE TABLET BY MOUTH TWICE A DAY Patient not taking: Reported on 11/23/2020 08/10/20   GAdrian Prows MD  HYDROcodone-acetaminophen (NORCO/VICODIN) 5-325 MG tablet Take 1 tablet by mouth every 6 (six) hours as needed. Patient not taking: Reported on 11/23/2020 05/10/19   WRipley Fraise MD  metoprolol tartrate (LOPRESSOR) 100 MG tablet TAKE ONE TABLET BY MOUTH TWICE A DAY Patient not taking: Reported on 11/23/2020 08/10/20   GAdrian Prows MD  nitroGLYCERIN (NITROSTAT) 0.4 MG SL tablet Place 1 tablet (0.4 mg total) under the tongue every 5 (five) minutes as needed for up to 25 days for chest pain. 08/01/19 08/26/19  GAdrian Prows MD    Social History   Socioeconomic History   Marital status: Married     Spouse name: Not on file   Number of children: 0   Years of education: Not on file   Highest education level: Not on file  Occupational History   Not on file  Tobacco Use   Smoking status: Never   Smokeless tobacco: Never  Vaping Use   Vaping Use: Never used  Substance and Sexual Activity   Alcohol use: Never   Drug use: Never   Sexual activity: Not on file  Other Topics Concern   Not on file  Social History Narrative   Not on file   Social Determinants of Health   Financial Resource Strain: Not on file  Food Insecurity: Not on file  Transportation Needs: Not on file  Physical Activity: Not on file  Stress: Not on file  Social Connections: Not  on file  Intimate Partner Violence: Not on file     Family History  Problem Relation Age of Onset   Diabetes Mother    Hypertension Mother    Hyperlipidemia Mother    Thyroid disease Mother    Hypertension Father    Diabetes Father     ROS:  Cardiovascular: []  chest pain/pressure []  palpitations []  SOB lying flat []  DOE []  pain in legs while walking []  pain in legs at rest []  pain in legs at night []  non-healing ulcers []  hx of DVT []  swelling in legs  Pulmonary: []  productive cough []  asthma/wheezing []  home O2  Neurologic: []  weakness in []  arms []  legs []  numbness in []  arms []  legs []  hx of CVA []  mini stroke [] difficulty speaking or slurred speech []  temporary loss of vision in one eye []  dizziness  Hematologic: []  hx of cancer []  bleeding problems []  problems with blood clotting easily  Endocrine:   []  diabetes []  thyroid disease  GI [x]  abdominal pain []  blood in stool  GU: []  CKD/renal failure []  HD--[]  M/W/F or []  T/T/S []  burning with urination []  blood in urine  Psychiatric: []  anxiety []  depression  Musculoskeletal: []  arthritis []  joint pain  Integumentary: []  rashes []  ulcers  Constitutional: []  fever []  chills   Physical Examination  Vitals:   11/23/20 0800  11/23/20 1000  BP: (!) 155/101 138/82  Pulse: (!) 114 (!) 111  Resp: 20 20  Temp:    SpO2: 98% 96%   Body mass index is 26.62 kg/m.  General:  nad HENT: WNL, normocephalic Pulmonary: normal non-labored breathing Cardiac: Palpable radial, popliteal, pedal pulses bilaterally Abdomen: soft, NT/ND, no masses Extremities: Warm and well-perfused Musculoskeletal: no muscle wasting or atrophy Neurologic: A&O X 3; Appropriate Affect ; SENSATION: normal; MOTOR FUNCTION:  moving all extremities equally. Speech is fluent/normal  CBC    Component Value Date/Time   WBC 8.1 11/23/2020 0414   RBC 3.65 (L) 11/23/2020 0414   HGB 11.6 (L) 11/23/2020 0414   HCT 31.9 (L) 11/23/2020 0414   PLT 176 11/23/2020 0414   MCV 87.4 11/23/2020 0414   MCH 31.8 11/23/2020 0414   MCHC 36.4 (H) 11/23/2020 0414   RDW 15.8 (H) 11/23/2020 0414   LYMPHSABS 1.6 11/22/2020 1843   MONOABS 0.5 11/22/2020 1843   EOSABS 0.1 11/22/2020 1843   BASOSABS 0.0 11/22/2020 1843    BMET    Component Value Date/Time   NA 128 (L) 11/23/2020 0414   NA 137 08/22/2019 0914   K 5.3 (H) 11/23/2020 0414   CL 105 11/23/2020 0414   CO2 15 (L) 11/23/2020 0414   GLUCOSE 207 (H) 11/23/2020 0414   BUN 25 (H) 11/23/2020 0414   BUN 49 (H) 08/22/2019 0914   CREATININE 0.49 11/23/2020 0414   CALCIUM 7.4 (L) 11/23/2020 0414   GFRNONAA >60 11/23/2020 0414   GFRAA 68 08/22/2019 0914    COAGS: No results found for: INR, PROTIME   Non-Invasive Vascular Imaging:   CT IMPRESSION: 1. No CT findings to suggest acute pancreatitis. 2. Interval increase in hepatomegaly. 3. Hepatic steatosis. 4. Interval development of mild splenomegaly. 5. Atrophic uterus. 6. Similar-appearing mild circumferential hypodensity along the abdominal aorta and bilateral common iliac arteries. Finding may represent intimal thickening in the setting possible vasculitis.   ASSESSMENT/PLAN: This is a 30 y.o. female without significant vascular history now  here with abdominal pain.  CT scan demonstrated abnormalities of her abdominal aorta and bilateral common iliac arteries.  In review of previous CT earlier this year this was present then although characterized as atherosclerosis at that time.  Vasculitis is certainly within the differential diagnosis but what ever the underlying etiology this does appear to be stable from CT scan 7 months ago and also does not appear to be an acute issue.  If there is further concern would recommend follow-up with her rheumatologist as an outpatient.  There is no current role for vascular intervention and she can follow-up with me on an as-needed basis.  Honey Zakarian C. Donzetta Matters, MD [facility] Office: [phone] Pager: [phone]

## 2020-11-23 NOTE — ED Notes (Signed)
Pt given diet ginger ale for PO challenge

## 2020-11-23 NOTE — ED Notes (Signed)
[id number]

## 2020-11-24 ENCOUNTER — Inpatient Hospital Stay (HOSPITAL_COMMUNITY): Payer: BC Managed Care – PPO

## 2020-11-24 DIAGNOSIS — R109 Unspecified abdominal pain: Secondary | ICD-10-CM | POA: Diagnosis not present

## 2020-11-24 LAB — GLUCOSE, CAPILLARY
Glucose-Capillary: 283 mg/dL — ABNORMAL HIGH (ref 70–99)
Glucose-Capillary: 148 mg/dL — ABNORMAL HIGH (ref 70–99)
Glucose-Capillary: 191 mg/dL — ABNORMAL HIGH (ref 70–99)
Glucose-Capillary: 166 mg/dL — ABNORMAL HIGH (ref 70–99)
Glucose-Capillary: 219 mg/dL — ABNORMAL HIGH (ref 70–99)

## 2020-11-24 MED ORDER — IOHEXOL 350 MG/ML SOLN
80.0000 mL | Freq: Once | INTRAVENOUS | Status: AC | PRN
  Administered 2020-11-24: 80 mL via INTRAVENOUS

## 2020-11-24 MED ORDER — HYDROMORPHONE HCL 1 MG/ML IJ SOLN
1.0000 mg | INTRAMUSCULAR | Status: DC | PRN
  Administered 2020-11-24 – 2020-11-26 (×10): 1 mg via INTRAVENOUS
  Filled 2020-11-24 (×10): qty 1

## 2020-11-24 MED ORDER — HYDROMORPHONE HCL 1 MG/ML IJ SOLN
1.0000 mg | INTRAMUSCULAR | Status: AC | PRN
  Administered 2020-11-24 (×2): 1 mg via INTRAVENOUS
  Filled 2020-11-24 (×2): qty 1

## 2020-11-24 NOTE — Assessment & Plan Note (Signed)
-   Continue Toprol

## 2020-11-24 NOTE — Assessment & Plan Note (Signed)
-   A1c 7.8% on 11/23/20 - continue SSI and CBG monitoring

## 2020-11-24 NOTE — Progress Notes (Signed)
Progress Note    Jennifer [name]   [id number]  DOB: 05/29/1990  DOA: 11/22/2020     1  PCP: Sue Lush, PA-C  Initial CC: abd pain  Hospital Course: [name] is a 30 yo female with PMH astrocytoma, DMII, NAFLD, HTN, hypothyroidism, PCOS, sCHF, pancreatitis who presented with abdominal pain.  Her pain had been present for approximately 2 weeks prior to admission with progressive worsening.  There was also associated diarrhea but no nausea/vomiting.  She had similar pain in the past during prior pancreatitis episodes.  She has been compliant with Creon at home. She also follows outpatient with GI and has had a recent EGD. She also follows annually with neurosurgery for her history of astrocytoma as a child which required chemo and may have contributed to her underlying CHF.  She also follows with endocrinology and cardiology for her diabetes and CHF respectively.  She underwent work-up in the ER.  Lipase was normal and CT abdomen/pelvis showed no signs of pancreatitis. There was an area of "mild circumferential hypodensity along abdominal aorta and bilateral common iliac arteries" noted on CT abdomen/pelvis. She was evaluated by vascular surgery after admission as well and given imaging findings, there was no indication for intervention.  Interval History:  No events overnight.  This morning she had stated that her diarrhea had resolved and she was having no abdominal pain when asked.  She has also been tolerating liquid diet.  We discussed trial of advancing to soft diet today and if tolerates well, then we would pursue home.  Called and spoke with her mother, Jennifer [name] this afternoon as well.  Unfortunately, patient developed severe abdominal pain in the early afternoon prior to trying soft diet.  ROS: Constitutional: negative for chills and fevers, Respiratory: negative for cough, Cardiovascular: negative for chest pain, and Gastrointestinal: positive for abdominal  pain  Assessment & Plan: * Abdominal pain - unclear etiology currently. Differential on admission included vasculitis however CT findings appeared stable with comparison of prior; other considered differential is mesenteric ischemia given some association with meals but also with her underlying risk factors for vascular disease - initially on CLD; did not tolerated soft diet on 8/30 - obtain CTA abd/pelvis to further evaluate - continue pain and nausea control  - of note has undergone gastric emptying study on 08/25/20 (53% at 4 hours, consistent with delayed gastric emptying)  Prolonged QT interval - QTC 494  Hypertriglyceridemia - Continue fenofibrate and Crestor  Essential hypertension - Continue Toprol  Chronic systolic CHF (congestive heart failure) ([place]) - last echo: 03/2019: EF 40%, Gr 1 DD, Grade 3 MR, moderate TR. Was stable compared to 09/2018 echo - no s/s exacerbation - continue home lasix and Entresto   Hypothyroidism - Continue Synthroid  DM (diabetes mellitus) ([place]) - A1c 7.8% on 11/23/20 - continue SSI and CBG monitoring     Old records reviewed in assessment of this patient  Antimicrobials:   DVT prophylaxis: enoxaparin (LOVENOX) injection 40 mg Start: 11/23/20 1000   Code Status:   Code Status: Full Code Family Communication: mom  Disposition Plan: Status is: Inpatient  Remains inpatient appropriate because:Ongoing active pain requiring inpatient pain management, IV treatments appropriate due to intensity of illness or inability to take PO, and Inpatient level of care appropriate due to severity of illness  Dispo: The patient is from: Home              Anticipated d/c is to: Home  Patient currently is not medically stable to d/c.   Difficult to place patient No  Risk of unplanned readmission score: Unplanned Admission- Pilot do not use: 16.52   Objective: Blood pressure 117/85, pulse 97, temperature 98 F (36.7 C), temperature  source Oral, resp. rate 19, height 4' 9"  (1.448 m), weight 55.8 kg, last menstrual period 11/19/2020, SpO2 99 %.  Examination: General appearance: alert, cooperative, and no distress Head: Normocephalic, without obvious abnormality, atraumatic Eyes:  EOMI Lungs: clear to auscultation bilaterally Heart: regular rate and rhythm and S1, S2 normal Abdomen:  Mildly distended and swollen but no obvious tenderness to palpation.  Bowel sounds present. Extremities:  No edema Skin: mobility and turgor normal Neurologic: Grossly normal  Consultants:   Vascular surgery  Procedures:    Data Reviewed: I have personally reviewed following labs and imaging studies Results for orders placed or performed during the hospital encounter of 11/22/20 (from the past 24 hour(s))  Glucose, capillary     Status: Abnormal   Collection Time: 11/23/20  4:45 PM  Result Value Ref Range   Glucose-Capillary 283 (H) 70 - 99 mg/dL  Glucose, capillary     Status: Abnormal   Collection Time: 11/23/20  9:45 PM  Result Value Ref Range   Glucose-Capillary 134 (H) 70 - 99 mg/dL  Glucose, capillary     Status: Abnormal   Collection Time: 11/24/20  7:31 AM  Result Value Ref Range   Glucose-Capillary 148 (H) 70 - 99 mg/dL  Glucose, capillary     Status: Abnormal   Collection Time: 11/24/20 11:27 AM  Result Value Ref Range   Glucose-Capillary 191 (H) 70 - 99 mg/dL    Recent Results (from the past 240 hour(s))  Resp Panel by RT-PCR (Flu A&B, Covid) Nasopharyngeal Swab     Status: None   Collection Time: 11/23/20  1:25 AM   Specimen: Nasopharyngeal Swab; Nasopharyngeal(NP) swabs in vial transport medium  Result Value Ref Range Status   SARS Coronavirus 2 by RT PCR NEGATIVE NEGATIVE Final    Comment: (NOTE) SARS-CoV-2 target nucleic acids are NOT DETECTED.  The SARS-CoV-2 RNA is generally detectable in upper respiratory specimens during the acute phase of infection. The lowest concentration of SARS-CoV-2 viral copies  this assay can detect is 138 copies/mL. A negative result does not preclude SARS-Cov-2 infection and should not be used as the sole basis for treatment or other patient management decisions. A negative result may occur with  improper specimen collection/handling, submission of specimen other than nasopharyngeal swab, presence of viral mutation(s) within the areas targeted by this assay, and inadequate number of viral copies(<138 copies/mL). A negative result must be combined with clinical observations, patient history, and epidemiological information. The expected result is Negative.  Fact Sheet for Patients:  EntrepreneurPulse.com.au  Fact Sheet for Healthcare Providers:  IncredibleEmployment.be  This test is no t yet approved or cleared by the [place] FDA and  has been authorized for detection and/or diagnosis of SARS-CoV-2 by FDA under an Emergency Use Authorization (EUA). This EUA will remain  in effect (meaning this test can be used) for the duration of the COVID-19 declaration under Section 564(b)(1) of the Act, 21 U.S.C.section 360bbb-3(b)(1), unless the authorization is terminated  or revoked sooner.       Influenza A by PCR NEGATIVE NEGATIVE Final   Influenza B by PCR NEGATIVE NEGATIVE Final    Comment: (NOTE) The Xpert Xpress SARS-CoV-2/FLU/RSV plus assay is intended as an aid in the diagnosis of influenza from  Nasopharyngeal swab specimens and should not be used as a sole basis for treatment. Nasal washings and aspirates are unacceptable for Xpert Xpress SARS-CoV-2/FLU/RSV testing.  Fact Sheet for Patients: EntrepreneurPulse.com.au  Fact Sheet for Healthcare Providers: IncredibleEmployment.be  This test is not yet approved or cleared by the [place] FDA and has been authorized for detection and/or diagnosis of SARS-CoV-2 by FDA under an Emergency Use Authorization (EUA). This EUA  will remain in effect (meaning this test can be used) for the duration of the COVID-19 declaration under Section 564(b)(1) of the Act, 21 U.S.C. section 360bbb-3(b)(1), unless the authorization is terminated or revoked.  Performed at [facility], [place] [address], [place], [place] [address]      Radiology Studies: DG Chest 1 View  Result Date: 11/23/2020 CLINICAL DATA:  Abdominal pain, vomiting and diarrhea. EXAM: CHEST  1 VIEW COMPARISON:  October 07, 2020 FINDINGS: There is no evidence of acute infiltrate, pleural effusion or pneumothorax. The heart size and mediastinal contours are within normal limits. The visualized skeletal structures are unremarkable. IMPRESSION: No active cardiopulmonary disease. Electronically Signed   By: Virgina Norfolk M.D.   On: 11/23/2020 01:55   CT ABDOMEN PELVIS W CONTRAST  Result Date: 11/22/2020 CLINICAL DATA:  Epigastric pain hx recurrent pancreatitis EXAM: CT ABDOMEN AND PELVIS WITH CONTRAST TECHNIQUE: Multidetector CT imaging of the abdomen and pelvis was performed using the standard protocol following bolus administration of intravenous contrast. CONTRAST:  49m OMNIPAQUE IOHEXOL 350 MG/ML SOLN COMPARISON:  CT abdomen pelvis 04/18/2020 FINDINGS: Lower chest: Bilateral lower lobe subsegmental atelectasis. Hepatobiliary: Interval increase in size of the liver now measuring 21 cm (from 18.5 cm). The hepatic parenchyma is diffusely hypodense compared to the splenic parenchyma consistent with fatty infiltration. No focal liver abnormality. No gallstones, gallbladder wall thickening, or pericholecystic fluid. No biliary dilatation. Pancreas: No focal lesion. Normal pancreatic contour. No surrounding inflammatory changes. No main pancreatic ductal dilatation. Spleen: Interval development of a mildly enlarged spleen measuring up to 13.5 cm. No focal abnormality. Adrenals/Urinary Tract: No adrenal nodule bilaterally. Bilateral kidneys enhance  symmetrically. No hydronephrosis. No hydroureter. The urinary bladder is unremarkable. Stomach/Bowel: Stomach is within normal limits. No evidence of bowel wall thickening or dilatation. Appendix appears normal. Vascular/Lymphatic: No abdominal aorta or iliac aneurysm. Similar-appearing mild circumferential hypodensity along the abdominal aorta and bilateral common iliac arteries. No abdominal, pelvic, or inguinal lymphadenopathy. Reproductive: Atrophic uterus. Otherwise uterus and bilateral adnexa are unremarkable. Other: No intraperitoneal free fluid. No intraperitoneal free gas. No organized fluid collection. Musculoskeletal: No abdominal wall hernia or abnormality. No suspicious lytic or blastic osseous lesions. No acute displaced fracture. Multilevel degenerative changes of the spine. IMPRESSION: 1. No CT findings to suggest acute pancreatitis. 2. Interval increase in hepatomegaly. 3. Hepatic steatosis. 4. Interval development of mild splenomegaly. 5. Atrophic uterus. 6. Similar-appearing mild circumferential hypodensity along the abdominal aorta and bilateral common iliac arteries. Finding may represent intimal thickening in the setting possible vasculitis. Electronically Signed   By: MIven FinnM.D.   On: 11/22/2020 23:06   DG Chest 1 View  Final Result    CT ABDOMEN PELVIS W CONTRAST  Final Result    CT Angio Abd/Pel w/ and/or w/o    (Results Pending)    Scheduled Meds:  DULoxetine  60 mg Oral Daily   enoxaparin (LOVENOX) injection  40 mg Subcutaneous Q24H   famotidine  20 mg Oral BID   fenofibrate  160 mg Oral Daily   furosemide  20 mg Oral  Daily   insulin aspart  0-15 Units Subcutaneous TID WC   insulin aspart  0-5 Units Subcutaneous QHS   insulin detemir  25 Units Subcutaneous BID   levothyroxine  150 mcg Oral Q0600   metoprolol  200 mg Oral Daily   metoprolol succinate  50 mg Oral Daily   norethindrone-ethinyl estradiol  1 tablet Oral Daily   pregabalin  300 mg Oral BID    rosuvastatin  40 mg Oral Daily   sacubitril-valsartan  1 tablet Oral BID   sodium chloride flush  3 mL Intravenous Q12H   PRN Meds: sodium chloride, acetaminophen **OR** acetaminophen, diphenhydrAMINE, ondansetron (ZOFRAN) IV, senna-docusate, sodium chloride flush Continuous Infusions:  sodium chloride     ondansetron (ZOFRAN) IV       LOS: 1 day  Time spent: Greater than 50% of the 35 minute visit was spent in counseling/coordination of care for the patient as laid out in the A&P.   Dwyane Dee, MD [facility] 11/24/2020, 2:39 PM

## 2020-11-24 NOTE — Hospital Course (Addendum)
Jennifer [name] is a 29 yo female with PMH astrocytoma, DMII, NAFLD, LPL deficiency, HTN, hypothyroidism, PCOS, sCHF, chronic pancreatitis who presented with progressive worsening abdominal pain x 2 weeks and diarrhea, pt reported similar to prior pancreatitis.  No associated N/V.  Evaluation in the ED showed normal lipase and no signs of pancreatitis on CT abdomen/pelvis.  Due to CT findings distal aorta and bilateral iliac arteries concerning for possible vasculitis, patient was seen by vascular surgery.  Recommended outpatient follow up.  On 8/31, lipid panel showed severe hypertriglyceridemia with TG's 4030.  Hx of lipoprotein lipase deficiency, followed by endocrinology at [facility] (Dr. Garrel Ridgel).  Treated with insulin/dextrose infusion.  TG's trended down.  Lipase remained normal.    On 9/3 patient developed hypotension and renal function was worsening.  Nephrology and Cardiology were consulted.  Echo showed EF down at 25-30% with elevated LAP and RV volume overload.  Patient was transferred to [facility] and required Levophed and dobutamine in ICU.   follows annually with neurosurgery for her history of astrocytoma as a child which required chemo and may have contributed to her underlying CHF.    follows with endocrinology and cardiology for her diabetes and CHF respectively.

## 2020-11-24 NOTE — Assessment & Plan Note (Addendum)
-   Continue fenofibrate and Crestor - TG 4040, CHL 473; lipase negative x 3 and no pancreatitis noted on CT A/P - patient has history of LPL deficiency and follows with [facility] endocrinology; also discussed case with Dr. Garrel Ridgel at [facility] on 9/2 - transferred patient to SDU and started insulin/dextrose infusions for HyperTG (if unsuccessful would need to consider tx for plasmaphresis) - cycle lipid panel q12h

## 2020-11-24 NOTE — Assessment & Plan Note (Addendum)
-   last echo: 03/2019: EF 40%, Gr 1 DD, Grade 3 MR, moderate TR. Was stable compared to 09/2018 echo - no s/s exacerbation - continue home lasix and Entresto (on hold for now due to hypotension and dizziness as well as AKI) - repeating echo at this time - low threshold for cardiology consult

## 2020-11-24 NOTE — Assessment & Plan Note (Signed)
-   QTC 494

## 2020-11-24 NOTE — Assessment & Plan Note (Addendum)
-   unclear etiology currently. Differential on admission included vasculitis however CT findings appeared stable with comparison of prior; other considered differential is mesenteric ischemia given some association with meals but also with her underlying risk factors for vascular disease (also ruled out with CTA A/P on 8/30 and discussed findings with vascular as well) - continue soft diet - continue pain control - at this point, have ruled out enough serious etiologies; patient states has also had extensive outpt workup for this as well with no clear etiology - continue bentyl, tylenol, cymbalta, pepcid, lyrica - trial of GI cocktail in case related to GERD (does localize the worst spot in her epigastrium)  - of note has undergone gastric emptying study on 08/25/20 (53% at 4 hours, consistent with delayed gastric emptying) - may also be related to HyperTG though no pancreatitis on imaging and normal lipse (possibly burnt out pancreas? But not atrophy noted on CT either)

## 2020-11-24 NOTE — Plan of Care (Signed)

## 2020-11-24 NOTE — Assessment & Plan Note (Signed)
Continue Synthroid °

## 2020-11-25 DIAGNOSIS — R109 Unspecified abdominal pain: Secondary | ICD-10-CM | POA: Diagnosis not present

## 2020-11-25 LAB — BASIC METABOLIC PANEL
Anion gap: 10 (ref 5–15)
BUN: 42 mg/dL — ABNORMAL HIGH (ref 6–20)
CO2: 20 mmol/L — ABNORMAL LOW (ref 22–32)
Calcium: 8.9 mg/dL (ref 8.9–10.3)
Chloride: 105 mmol/L (ref 98–111)
Creatinine, Ser: 1.57 mg/dL — ABNORMAL HIGH (ref 0.44–1.00)
GFR, Estimated: 46 mL/min — ABNORMAL LOW (ref >60)
Glucose, Bld: 212 mg/dL — ABNORMAL HIGH (ref 70–99)
Potassium: 5.6 mmol/L — ABNORMAL HIGH (ref 3.5–5.1)
Sodium: 135 mmol/L (ref 135–145)

## 2020-11-25 LAB — CBC WITH DIFFERENTIAL/PLATELET
Abs Immature Granulocytes: 0.05 10*3/uL (ref 0.00–0.07)
Basophils Absolute: 0.1 10*3/uL (ref 0.0–0.1)
Basophils Relative: 1 %
Eosinophils Absolute: 0.1 10*3/uL (ref 0.0–0.5)
Eosinophils Relative: 2 %
HCT: 33.8 % — ABNORMAL LOW (ref 36.0–46.0)
Hemoglobin: 11.4 g/dL — ABNORMAL LOW (ref 12.0–15.0)
Immature Granulocytes: 1 %
Lymphocytes Relative: 41 %
Lymphs Abs: 3.4 10*3/uL (ref 0.7–4.0)
MCH: 29.9 pg (ref 26.0–34.0)
MCHC: 33.7 g/dL (ref 30.0–36.0)
MCV: 88.7 fL (ref 80.0–100.0)
Monocytes Absolute: 0.8 10*3/uL (ref 0.1–1.0)
Monocytes Relative: 9 %
Neutro Abs: 3.8 10*3/uL (ref 1.7–7.7)
Neutrophils Relative %: 46 %
Platelets: 152 10*3/uL (ref 150–400)
RBC: 3.81 MIL/uL — ABNORMAL LOW (ref 3.87–5.11)
RDW: 16.5 % — ABNORMAL HIGH (ref 11.5–15.5)
WBC: 8.2 10*3/uL (ref 4.0–10.5)
nRBC: 0 % (ref 0.0–0.2)

## 2020-11-25 LAB — LIPID PANEL
Cholesterol: 473 mg/dL — ABNORMAL HIGH (ref 0–200)
HDL: 26 mg/dL — ABNORMAL LOW (ref >40)
Triglycerides: 4040 mg/dL — ABNORMAL HIGH (ref <150)
VLDL: UNDETERMINED mg/dL (ref 0–40)

## 2020-11-25 LAB — GLUCOSE, CAPILLARY
Glucose-Capillary: 214 mg/dL — ABNORMAL HIGH (ref 70–99)
Glucose-Capillary: 218 mg/dL — ABNORMAL HIGH (ref 70–99)
Glucose-Capillary: 170 mg/dL — ABNORMAL HIGH (ref 70–99)
Glucose-Capillary: 191 mg/dL — ABNORMAL HIGH (ref 70–99)

## 2020-11-25 LAB — MAGNESIUM: Magnesium: 3 mg/dL — ABNORMAL HIGH (ref 1.7–2.4)

## 2020-11-25 MED ORDER — DICYCLOMINE HCL 10 MG PO CAPS
10.0000 mg | ORAL_CAPSULE | Freq: Three times a day (TID) | ORAL | Status: DC
  Administered 2020-11-25 – 2020-11-28 (×9): 10 mg via ORAL
  Filled 2020-11-25 (×10): qty 1

## 2020-11-25 MED ORDER — LIDOCAINE VISCOUS HCL 2 % MT SOLN
15.0000 mL | Freq: Four times a day (QID) | OROMUCOSAL | Status: DC
  Administered 2020-11-25 – 2020-11-28 (×14): 15 mL via ORAL
  Filled 2020-11-25 (×21): qty 15

## 2020-11-25 MED ORDER — ALUM & MAG HYDROXIDE-SIMETH 200-200-20 MG/5ML PO SUSP
30.0000 mL | Freq: Four times a day (QID) | ORAL | Status: DC
  Administered 2020-11-25: 30 mL via ORAL
  Filled 2020-11-25 (×3): qty 30

## 2020-11-25 NOTE — Progress Notes (Signed)
    OVERNIGHT PROGRESS REPORT  Pt reported to RN that she slumped to her knees after getting up unassisted. There are no Deformities, Contusions, abrasions. She has no visible injuries and did not lose consciousness. She has agreed to call for assistance for the remainder of her admission. Vital signs and mentation remain at baseline.   Gershon Cull MSNA MSN ACNPC-AG Acute Care Nurse Practitioner [place]

## 2020-11-25 NOTE — Progress Notes (Signed)
   11/25/20 2240  What Happened  Was fall witnessed? No  Was patient injured? No  Patient found other (Comment) (bench)  Found by No one-pt stated they fell  Stated prior activity ambulating-unassisted  Follow Up  MD notified J. Olena Heckle  Time MD notified [id number]  Family notified Yes - comment (By patient)  Time family notified  (by patient, immediatley after fall)  Adult Fall Risk Assessment  Risk Factor Category (scoring not indicated) High fall risk per protocol (document High fall risk);Fall has occurred during this admission (document High fall risk)  Age 30  Fall History: Fall within 6 months prior to admission 0  Elimination; Bowel and/or Urine Incontinence 0  Elimination; Bowel and/or Urine Urgency/Frequency 0  Medications: includes PCA/Opiates, Anti-convulsants, Anti-hypertensives, Diuretics, Hypnotics, Laxatives, Sedatives, and Psychotropics 5  Patient Care Equipment 0  Mobility-Assistance 2  Mobility-Gait 0  Mobility-Sensory Deficit 0  Altered awareness of immediate physical environment 0  Impulsiveness 0  Lack of understanding of one's physical/cognitive limitations 0  Total Score 7  Patient Fall Risk Level High fall risk  Adult Fall Risk Interventions  Required Bundle Interventions *See Row Information* High fall risk - low, moderate, and high requirements implemented  Additional Interventions Use of appropriate toileting equipment (bedpan, BSC, etc.)  Screening for Fall Injury Risk (To be completed on HIGH fall risk patients) - Assessing Need for Floor Mats  Risk For Fall Injury- Criteria for Floor Mats None identified - No additional interventions needed  Vitals  Resp 19  Pain Assessment  Pain Scale 0-10  Pain Score 7  Pain Type Acute pain  Pain Location Abdomen  PCA/Epidural/Spinal Assessment  Respiratory Pattern Regular;Unlabored  Neurological  Neuro (WDL) WDL  Level of Consciousness Alert  Glasgow Coma Scale  Eye Opening 4  Best Verbal Response  (NON-intubated) 5  Best Motor Response 6  Glasgow Coma Scale Score 15  Musculoskeletal  Musculoskeletal (WDL) WDL  Assistive Device None  Integumentary  Skin Color Appropriate for ethnicity  Skin Integrity Ecchymosis  Ecchymosis Location Arm  Ecchymosis Location Orientation Bilateral  Ecchymosis Intervention Other (Comment) (assessed)

## 2020-11-25 NOTE — Progress Notes (Signed)
Progress Note    Jennifer [name]   QAS:[id number]  DOB: 1991-02-23  DOA: 11/22/2020     2  PCP: Sue Lush, PA-C  Initial CC: abd pain  Hospital Course: Jennifer [name] is a 30 yo female with PMH astrocytoma, DMII, NAFLD, HTN, hypothyroidism, PCOS, sCHF, pancreatitis who presented with abdominal pain.  Her pain had been present for approximately 2 weeks prior to admission with progressive worsening.  There was also associated diarrhea but no nausea/vomiting.  She had similar pain in the past during prior pancreatitis episodes.  She has been compliant with Creon at home. She also follows outpatient with GI and has had a recent EGD. She also follows annually with neurosurgery for her history of astrocytoma as a child which required chemo and may have contributed to her underlying CHF.  She also follows with endocrinology and cardiology for her diabetes and CHF respectively.  She underwent work-up in the ER.  Lipase was normal and CT abdomen/pelvis showed no signs of pancreatitis. There was an area of "mild circumferential hypodensity along abdominal aorta and bilateral common iliac arteries" noted on CT abdomen/pelvis. She was evaluated by vascular surgery after admission as well and given imaging findings, there was no indication for intervention.  Interval History:  Patient still having 5/10 abdominal pain this morning.  Called her mom while in the room for a group update and conversation. Reviewed all the findings from yesterday and work-up thus far.  There is no clear etiology for her abdominal pain despite extensive work-up.  We will trial GI cocktail today as she is localizing the worst amount of her pain to her epigastrium.  Otherwise discussed that the plan is to further monitor overnight with tentative plans for discharge tomorrow.  ROS: Constitutional: negative for chills and fevers, Respiratory: negative for cough, Cardiovascular: negative for chest pain, and  Gastrointestinal: positive for abdominal pain  Assessment & Plan: * Abdominal pain - unclear etiology currently. Differential on admission included vasculitis however CT findings appeared stable with comparison of prior; other considered differential is mesenteric ischemia given some association with meals but also with her underlying risk factors for vascular disease (also ruled out with CTA A/P on 8/30 and discussed findings with vascular as well) - continue soft diet - continue pain control - at this point, have ruled out enough serious etiologies; patient states has also had extensive outpt workup for this as well with no clear etiology - continue bentyl, tylenol, cymbalta, pepcid, lyrica - trial of GI cocktail in case related to GERD (does localize the worst spot in her epigastrium)  - of note has undergone gastric emptying study on 08/25/20 (53% at 4 hours, consistent with delayed gastric emptying)  Prolonged QT interval - QTC 494  Hypertriglyceridemia - Continue fenofibrate and Crestor  Essential hypertension - Continue Toprol  Chronic systolic CHF (congestive heart failure) ([place]) - last echo: 03/2019: EF 40%, Gr 1 DD, Grade 3 MR, moderate TR. Was stable compared to 09/2018 echo - no s/s exacerbation - continue home lasix and Entresto   Hypothyroidism - Continue Synthroid  DM (diabetes mellitus) ([place]) - A1c 7.8% on 11/23/20 - continue SSI and CBG monitoring    Old records reviewed in assessment of this patient  Antimicrobials:   DVT prophylaxis: enoxaparin (LOVENOX) injection 40 mg Start: 11/23/20 1000   Code Status:   Code Status: Full Code Family Communication: mom  Disposition Plan: Status is: Inpatient  Remains inpatient appropriate because:Ongoing active pain requiring inpatient pain management, IV treatments  appropriate due to intensity of illness or inability to take PO, and Inpatient level of care appropriate due to severity of illness  Dispo: The  patient is from: Home              Anticipated d/c is to: Home              Patient currently is not medically stable to d/c.   Difficult to place patient No  Risk of unplanned readmission score: Unplanned Admission- Pilot do not use: 16.24   Objective: Blood pressure 105/70, pulse 81, temperature 98.2 F (36.8 C), temperature source Oral, resp. rate 18, height 4' 9"  (1.448 m), weight 55.8 kg, last menstrual period 11/19/2020, SpO2 99 %.  Examination: General appearance: alert, cooperative, and no distress Head: Normocephalic, without obvious abnormality, atraumatic Eyes:  EOMI Lungs: clear to auscultation bilaterally Heart: regular rate and rhythm and S1, S2 normal Abdomen:  Mildly distended and swollen but no obvious tenderness to palpation.  Bowel sounds present. Extremities:  No edema Skin: mobility and turgor normal Neurologic: Grossly normal  Consultants:   Vascular surgery  Procedures:    Data Reviewed: I have personally reviewed following labs and imaging studies Results for orders placed or performed during the hospital encounter of 11/22/20 (from the past 24 hour(s))  Glucose, capillary     Status: Abnormal   Collection Time: 11/24/20  5:06 PM  Result Value Ref Range   Glucose-Capillary 166 (H) 70 - 99 mg/dL  Glucose, capillary     Status: Abnormal   Collection Time: 11/24/20  8:43 PM  Result Value Ref Range   Glucose-Capillary 219 (H) 70 - 99 mg/dL  Basic metabolic panel     Status: Abnormal   Collection Time: 11/25/20  4:01 AM  Result Value Ref Range   Sodium 135 135 - 145 mmol/L   Potassium 5.6 (H) 3.5 - 5.1 mmol/L   Chloride 105 98 - 111 mmol/L   CO2 20 (L) 22 - 32 mmol/L   Glucose, Bld 212 (H) 70 - 99 mg/dL   BUN 42 (H) 6 - 20 mg/dL   Creatinine, Ser 1.57 (H) 0.44 - 1.00 mg/dL   Calcium 8.9 8.9 - 10.3 mg/dL   GFR, Estimated 46 (L) >60 mL/min   Anion gap 10 5 - 15  CBC with Differential/Platelet     Status: Abnormal   Collection Time: 11/25/20  4:01 AM   Result Value Ref Range   WBC 8.2 4.0 - 10.5 K/uL   RBC 3.81 (L) 3.87 - 5.11 MIL/uL   Hemoglobin 11.4 (L) 12.0 - 15.0 g/dL   HCT 33.8 (L) 36.0 - 46.0 %   MCV 88.7 80.0 - 100.0 fL   MCH 29.9 26.0 - 34.0 pg   MCHC 33.7 30.0 - 36.0 g/dL   RDW 16.5 (H) 11.5 - 15.5 %   Platelets 152 150 - 400 K/uL   nRBC 0.0 0.0 - 0.2 %   Neutrophils Relative % 46 %   Neutro Abs 3.8 1.7 - 7.7 K/uL   Lymphocytes Relative 41 %   Lymphs Abs 3.4 0.7 - 4.0 K/uL   Monocytes Relative 9 %   Monocytes Absolute 0.8 0.1 - 1.0 K/uL   Eosinophils Relative 2 %   Eosinophils Absolute 0.1 0.0 - 0.5 K/uL   Basophils Relative 1 %   Basophils Absolute 0.1 0.0 - 0.1 K/uL   Immature Granulocytes 1 %   Abs Immature Granulocytes 0.05 0.00 - 0.07 K/uL  Magnesium  Status: Abnormal   Collection Time: 11/25/20  4:01 AM  Result Value Ref Range   Magnesium 3.0 (H) 1.7 - 2.4 mg/dL  Glucose, capillary     Status: Abnormal   Collection Time: 11/25/20  8:29 AM  Result Value Ref Range   Glucose-Capillary 214 (H) 70 - 99 mg/dL  Glucose, capillary     Status: Abnormal   Collection Time: 11/25/20 12:13 PM  Result Value Ref Range   Glucose-Capillary 218 (H) 70 - 99 mg/dL    Recent Results (from the past 240 hour(s))  Resp Panel by RT-PCR (Flu A&B, Covid) Nasopharyngeal Swab     Status: None   Collection Time: 11/23/20  1:25 AM   Specimen: Nasopharyngeal Swab; Nasopharyngeal(NP) swabs in vial transport medium  Result Value Ref Range Status   SARS Coronavirus 2 by RT PCR NEGATIVE NEGATIVE Final    Comment: (NOTE) SARS-CoV-2 target nucleic acids are NOT DETECTED.  The SARS-CoV-2 RNA is generally detectable in upper respiratory specimens during the acute phase of infection. The lowest concentration of SARS-CoV-2 viral copies this assay can detect is 138 copies/mL. A negative result does not preclude SARS-Cov-2 infection and should not be used as the sole basis for treatment or other patient management decisions. A negative  result may occur with  improper specimen collection/handling, submission of specimen other than nasopharyngeal swab, presence of viral mutation(s) within the areas targeted by this assay, and inadequate number of viral copies(<138 copies/mL). A negative result must be combined with clinical observations, patient history, and epidemiological information. The expected result is Negative.  Fact Sheet for Patients:  EntrepreneurPulse.com.au  Fact Sheet for Healthcare Providers:  IncredibleEmployment.be  This test is no t yet approved or cleared by the [place] FDA and  has been authorized for detection and/or diagnosis of SARS-CoV-2 by FDA under an Emergency Use Authorization (EUA). This EUA will remain  in effect (meaning this test can be used) for the duration of the COVID-19 declaration under Section 564(b)(1) of the Act, 21 U.S.C.section 360bbb-3(b)(1), unless the authorization is terminated  or revoked sooner.       Influenza A by PCR NEGATIVE NEGATIVE Final   Influenza B by PCR NEGATIVE NEGATIVE Final    Comment: (NOTE) The Xpert Xpress SARS-CoV-2/FLU/RSV plus assay is intended as an aid in the diagnosis of influenza from Nasopharyngeal swab specimens and should not be used as a sole basis for treatment. Nasal washings and aspirates are unacceptable for Xpert Xpress SARS-CoV-2/FLU/RSV testing.  Fact Sheet for Patients: EntrepreneurPulse.com.au  Fact Sheet for Healthcare Providers: IncredibleEmployment.be  This test is not yet approved or cleared by the [place] FDA and has been authorized for detection and/or diagnosis of SARS-CoV-2 by FDA under an Emergency Use Authorization (EUA). This EUA will remain in effect (meaning this test can be used) for the duration of the COVID-19 declaration under Section 564(b)(1) of the Act, 21 U.S.C. section 360bbb-3(b)(1), unless the authorization is  terminated or revoked.  Performed at [facility], [place] [address] [place], [place] [address]      Radiology Studies: CT Angio Abd/Pel w/ and/or w/o  Result Date: 11/24/2020 CLINICAL DATA:  Possible acute mesenteric ischemia. Mid abdominal pain and nausea EXAM: CTA ABDOMEN AND PELVIS WITHOUT AND WITH CONTRAST TECHNIQUE: Multidetector CT imaging of the abdomen and pelvis was performed using the standard protocol during bolus administration of intravenous contrast. Multiplanar reconstructed images and MIPs were obtained and reviewed to evaluate the vascular anatomy. CONTRAST:  17m OMNIPAQUE IOHEXOL 350  MG/ML SOLN COMPARISON:  Prior CT scan of the abdomen 11/22/2020 FINDINGS: VASCULAR Aorta: Normal caliber aorta without aneurysm, dissection, vasculitis or significant stenosis. Mild fibrofatty atherosclerotic plaque. Celiac: Patent without evidence of aneurysm, dissection, vasculitis or significant stenosis. Variant anatomy. Early branching of the common hepatic artery at the origin of the celiac axis. SMA: Patent without evidence of aneurysm, dissection, vasculitis or significant stenosis. Renals: 2 right-sided renal arteries and solitary left renal artery, all of which are patent without evidence of aneurysm, dissection, vasculitis, fibromuscular dysplasia or significant stenosis. IMA: Patent without evidence of aneurysm, dissection, vasculitis or significant stenosis. Inflow: Focal fibrofatty atherosclerotic plaque in the proximal left common iliac artery results in mild stenosis. Additionally, there is a small penetrating atherosclerotic ulcer. Proximal Outflow: Bilateral common femoral and visualized portions of the superficial and profunda femoral arteries are patent without evidence of aneurysm, dissection, vasculitis or significant stenosis. Veins: No focal venous abnormality. Review of the MIP images confirms the above findings. NON-VASCULAR Lower chest: Dependent atelectasis.  No  acute abnormality. Hepatobiliary: Impressive hepatomegaly and advanced hepatic steatosis. No discrete hepatic lesion. Minimal sparing around the gallbladder fossa. Pancreas: Unremarkable. No pancreatic ductal dilatation or surrounding inflammatory changes. Spleen: Normal in size without focal abnormality. Adrenals/Urinary Tract: Adrenal glands are unremarkable. Kidneys are normal, without renal calculi, focal lesion, or hydronephrosis. Bladder is unremarkable. Stomach/Bowel: Stomach is within normal limits. Appendix appears normal. No evidence of bowel wall thickening, distention, or inflammatory changes. Lymphatic: No suspicious lymphadenopathy. Reproductive: Small/atrophic uterus again noted. Bilateral adnexa are unremarkable. Other: No abdominal wall hernia or abnormality. No abdominopelvic ascites. Musculoskeletal: No acute or significant osseous findings. IMPRESSION: VASCULAR 1. No evidence of acute or chronic mesenteric ischemia. 2. Mild distal aortic fibrofatty atherosclerotic plaque. Aortic Atherosclerosis (ICD10-I70.0). 3. Focal fibrofatty atherosclerotic plaque results in mild stenosis and a small penetrating atherosclerotic ulceration in the left common iliac artery. NON-VASCULAR 1. Hepatomegaly with advanced hepatic steatosis. 2. Mild dependent atelectasis. Electronically Signed   By: Jacqulynn Cadet M.D.   On: 11/24/2020 15:19   CT Angio Abd/Pel w/ and/or w/o  Final Result    DG Chest 1 View  Final Result    CT ABDOMEN PELVIS W CONTRAST  Final Result      Scheduled Meds:  alum & mag hydroxide-simeth  30 mL Oral QID   And   lidocaine  15 mL Oral QID   dicyclomine  10 mg Oral TID AC   DULoxetine  60 mg Oral Daily   enoxaparin (LOVENOX) injection  40 mg Subcutaneous Q24H   famotidine  20 mg Oral BID   fenofibrate  160 mg Oral Daily   furosemide  20 mg Oral Daily   insulin aspart  0-15 Units Subcutaneous TID WC   insulin aspart  0-5 Units Subcutaneous QHS   insulin detemir  25  Units Subcutaneous BID   levothyroxine  150 mcg Oral Q0600   metoprolol  200 mg Oral Daily   metoprolol succinate  50 mg Oral Daily   norethindrone-ethinyl estradiol  1 tablet Oral Daily   pregabalin  300 mg Oral BID   rosuvastatin  40 mg Oral Daily   sacubitril-valsartan  1 tablet Oral BID   sodium chloride flush  3 mL Intravenous Q12H   PRN Meds: sodium chloride, acetaminophen **OR** acetaminophen, diphenhydrAMINE, HYDROmorphone (DILAUDID) injection, ondansetron (ZOFRAN) IV, senna-docusate, sodium chloride flush Continuous Infusions:  sodium chloride     ondansetron (ZOFRAN) IV       LOS: 2 days  Time spent: Greater than  50% of the 35 minute visit was spent in counseling/coordination of care for the patient as laid out in the A&P.   Dwyane Dee, MD [facility] 11/25/2020, 2:45 PM

## 2020-11-25 NOTE — Progress Notes (Signed)
Pt reported she fell on her knees. Pt stood up and felt dizzy then fell at around 10:15pm. She was completely independent before this. This RN entered the room around 10:20pm-10:25pm to give meds when pt reported about the incident. She was sitting on the bench when this RN entered the room-reported she managed to walk towards the bench before I got in. BP at 2144pm was 91/68, rechecked it after fall and it was 107/71. No noted bruises/wound on her knee, head and other parts of her body. [name] completely alert and oriented. She did not report any pain related fall post incidence. Pt reported fall to her Mom. Patient agreeable to call staff when getting up, and turning the bed alarm on.

## 2020-11-26 DIAGNOSIS — N179 Acute kidney failure, unspecified: Secondary | ICD-10-CM

## 2020-11-26 DIAGNOSIS — R109 Unspecified abdominal pain: Secondary | ICD-10-CM | POA: Diagnosis not present

## 2020-11-26 DIAGNOSIS — N1832 Chronic kidney disease, stage 3b: Secondary | ICD-10-CM

## 2020-11-26 LAB — CBC WITH DIFFERENTIAL/PLATELET
Abs Immature Granulocytes: 0.05 10*3/uL (ref 0.00–0.07)
Basophils Absolute: 0.1 10*3/uL (ref 0.0–0.1)
Basophils Relative: 1 %
Eosinophils Absolute: 0.1 10*3/uL (ref 0.0–0.5)
Eosinophils Relative: 1 %
HCT: 35.7 % — ABNORMAL LOW (ref 36.0–46.0)
Hemoglobin: 11.5 g/dL — ABNORMAL LOW (ref 12.0–15.0)
Immature Granulocytes: 1 %
Lymphocytes Relative: 31 %
Lymphs Abs: 2.8 10*3/uL (ref 0.7–4.0)
MCH: 28.6 pg (ref 26.0–34.0)
MCHC: 32.2 g/dL (ref 30.0–36.0)
MCV: 88.8 fL (ref 80.0–100.0)
Monocytes Absolute: 0.7 10*3/uL (ref 0.1–1.0)
Monocytes Relative: 8 %
Neutro Abs: 5.4 10*3/uL (ref 1.7–7.7)
Neutrophils Relative %: 58 %
Platelets: 191 10*3/uL (ref 150–400)
RBC: 4.02 MIL/uL (ref 3.87–5.11)
RDW: 16.3 % — ABNORMAL HIGH (ref 11.5–15.5)
WBC: 9.2 10*3/uL (ref 4.0–10.5)
nRBC: 0 % (ref 0.0–0.2)

## 2020-11-26 LAB — GLUCOSE, CAPILLARY
Glucose-Capillary: 179 mg/dL — ABNORMAL HIGH (ref 70–99)
Glucose-Capillary: 235 mg/dL — ABNORMAL HIGH (ref 70–99)
Glucose-Capillary: 263 mg/dL — ABNORMAL HIGH (ref 70–99)
Glucose-Capillary: 260 mg/dL — ABNORMAL HIGH (ref 70–99)

## 2020-11-26 LAB — GASTROINTESTINAL PANEL BY PCR, STOOL (REPLACES STOOL CULTURE)

## 2020-11-26 LAB — BASIC METABOLIC PANEL
Anion gap: 17 — ABNORMAL HIGH (ref 5–15)
BUN: 53 mg/dL — ABNORMAL HIGH (ref 6–20)
CO2: 14 mmol/L — ABNORMAL LOW (ref 22–32)
Calcium: 9.3 mg/dL (ref 8.9–10.3)
Chloride: 100 mmol/L (ref 98–111)
Creatinine, Ser: 3.12 mg/dL — ABNORMAL HIGH (ref 0.44–1.00)
GFR, Estimated: 20 mL/min — ABNORMAL LOW (ref >60)
Glucose, Bld: 230 mg/dL — ABNORMAL HIGH (ref 70–99)
Potassium: 5.2 mmol/L — ABNORMAL HIGH (ref 3.5–5.1)
Sodium: 131 mmol/L — ABNORMAL LOW (ref 135–145)

## 2020-11-26 LAB — MAGNESIUM: Magnesium: 2.5 mg/dL — ABNORMAL HIGH (ref 1.7–2.4)

## 2020-11-26 LAB — LIPASE, BLOOD: Lipase: 21 U/L (ref 11–51)

## 2020-11-26 MED ORDER — OXYCODONE HCL 5 MG PO TABS
5.0000 mg | ORAL_TABLET | ORAL | Status: DC | PRN
Start: 2020-11-26 — End: 2020-11-27
  Administered 2020-11-26 (×2): 5 mg via ORAL
  Filled 2020-11-26 (×2): qty 1

## 2020-11-26 MED ORDER — FAMOTIDINE 20 MG PO TABS
10.0000 mg | ORAL_TABLET | Freq: Every day | ORAL | Status: DC
  Administered 2020-11-27 – 2020-12-06 (×10): 10 mg via ORAL
  Filled 2020-11-26 (×11): qty 1

## 2020-11-26 MED ORDER — PHENOL 1.4 % MT LIQD
1.0000 | OROMUCOSAL | Status: DC | PRN
  Administered 2020-11-26: 1 via OROMUCOSAL
  Filled 2020-11-26: qty 177

## 2020-11-26 MED ORDER — PREGABALIN 75 MG PO CAPS
75.0000 mg | ORAL_CAPSULE | Freq: Two times a day (BID) | ORAL | Status: DC
  Administered 2020-11-26 – 2020-11-28 (×4): 75 mg via ORAL
  Filled 2020-11-26 (×4): qty 1

## 2020-11-26 MED ORDER — ENOXAPARIN SODIUM 30 MG/0.3ML IJ SOSY
30.0000 mg | PREFILLED_SYRINGE | INTRAMUSCULAR | Status: DC
  Administered 2020-11-26: 30 mg via SUBCUTANEOUS
  Filled 2020-11-26: qty 0.3

## 2020-11-26 MED ORDER — SODIUM CHLORIDE 0.9 % IV SOLN: INTRAVENOUS | Status: DC

## 2020-11-26 MED ORDER — HEPARIN SODIUM (PORCINE) 5000 UNIT/ML IJ SOLN
5000.0000 [IU] | Freq: Three times a day (TID) | INTRAMUSCULAR | Status: DC
  Administered 2020-11-27 – 2020-12-06 (×27): 5000 [IU] via SUBCUTANEOUS
  Filled 2020-11-26 (×28): qty 1

## 2020-11-26 NOTE — Progress Notes (Signed)
Inpatient Diabetes Program Recommendations  AACE/ADA: New Consensus Statement on Inpatient Glycemic Control (2015)  Target Ranges:  Prepandial:   less than 140 mg/dL      Peak postprandial:   less than 180 mg/dL (1-2 hours)      Critically ill patients:  140 - 180 mg/dL   Lab Results  Component Value Date   GLUCAP 235 (H) 11/26/2020   HGBA1C 7.8 (H) 11/23/2020    Review of Glycemic Control  Diabetes history: DM2 Outpatient Diabetes medications: Toujeo 94 units BID, Novolog 60 units TID Current orders for Inpatient glycemic control: Levemir 25 units BID, Novolog 0-15 units TID with meals and 0-5 HS  Inpatient Diabetes Program Recommendations:    Add Novolog 4 units TID with meals if eating > 50%.  Continue to follow.   Thank you. Lorenda Peck, RD, LDN, CDE Inpatient Diabetes Coordinator [phone]

## 2020-11-26 NOTE — Assessment & Plan Note (Addendum)
-   baseline creatinine ~ 0.8 - 1 - patient presents with increase in creat >0.3 mg/dL above baseline, creat increase >1.5x baseline presumed to have occurred within past 7 days PTA -Creatinine has been worsening in the past 1 to 2 days.  [name] also developed difficulty with voiding and required straight cath on 9/1 - she has since been able to void well; can stop bladder scans - creatinine continues to rise but rate of change today less than prior, hoping this is peaking and stabilizing (possibly ATN from BOO) - she did not tolerate IVF as they caused SOB also - hold IVF and hold Entresto and lasix - BMP daily  - still worsening function on 9/3; output 1100 cc last 24 hours - nephrology consulted for assistance - starting oral bicarb tabs due to acidosis

## 2020-11-26 NOTE — Progress Notes (Signed)
Progress Note    Jennifer [name]   AGT:[id number]  DOB: Aug 25, 1990  DOA: 11/22/2020     3  PCP: Sue Lush, PA-C  Initial CC: abd pain  Hospital Course: [name] is a 30 yo female with PMH astrocytoma, DMII, NAFLD, HTN, hypothyroidism, PCOS, sCHF, pancreatitis who presented with abdominal pain.  Her pain had been present for approximately 2 weeks prior to admission with progressive worsening.  There was also associated diarrhea but no nausea/vomiting.  She had similar pain in the past during prior pancreatitis episodes.  She has been compliant with Creon at home. She also follows outpatient with GI and has had a recent EGD. She also follows annually with neurosurgery for her history of astrocytoma as a child which required chemo and may have contributed to her underlying CHF.  She also follows with endocrinology and cardiology for her diabetes and CHF respectively.  She underwent work-up in the ER.  Lipase was normal and CT abdomen/pelvis showed no signs of pancreatitis. There was an area of "mild circumferential hypodensity along abdominal aorta and bilateral common iliac arteries" noted on CT abdomen/pelvis. She was evaluated by vascular surgery after admission as well and given imaging findings, there was no indication for intervention.  Interval History:  Abdominal pain had improved some this morning.  She had difficulty urinating and required straight cath today.  Starting on serial bladder scans and fluids today.  Renal function also showed some further worsening today.  Communicated all findings with patient and understands plan is for ongoing fluids and monitoring urine output for the next day.  ROS: Constitutional: negative for chills and fevers, Respiratory: negative for cough, Cardiovascular: negative for chest pain, and Gastrointestinal: positive for abdominal pain  Assessment & Plan: * Abdominal pain - unclear etiology currently. Differential on admission  included vasculitis however CT findings appeared stable with comparison of prior; other considered differential is mesenteric ischemia given some association with meals but also with her underlying risk factors for vascular disease (also ruled out with CTA A/P on 8/30 and discussed findings with vascular as well) - continue soft diet - continue pain control - at this point, have ruled out enough serious etiologies; patient states has also had extensive outpt workup for this as well with no clear etiology - continue bentyl, tylenol, cymbalta, pepcid, lyrica - trial of GI cocktail in case related to GERD (does localize the worst spot in her epigastrium)  - of note has undergone gastric emptying study on 08/25/20 (53% at 4 hours, consistent with delayed gastric emptying)  AKI (acute kidney injury) ([place]) - baseline creatinine ~ 0.8 - 1 - patient presents with increase in creat >0.3 mg/dL above baseline, creat increase >1.5x baseline presumed to have occurred within past 7 days PTA -Creatinine has been worsening in the past 1 to 2 days.  [name] also developing difficulty with voiding and required straight cath today - Continue every 6 hour bladder scans - Started on fluids - Repeat BMP in a.m.  Prolonged QT interval - QTC 494  Hypertriglyceridemia - Continue fenofibrate and Crestor - TG 4040, CHL 473; lipase negative x 2 and no pancreatitis noted on CT A/P - patient has history of LPL deficiency and follows with [facility] end[facility]ology  Essential hypertension - Continue Toprol  Chronic systolic CHF (congestive heart failure) ([place]) - last echo: 03/2019: EF 40%, Gr 1 DD, Grade 3 MR, moderate TR. Was stable compared to 09/2018 echo - no s/s exacerbation - continue home lasix and Entresto (  held today for renal function and dizziness)  Hypothyroidism - Continue Synthroid  DM (diabetes mellitus) ([place]) - A1c 7.8% on 11/23/20 - continue SSI and CBG monitoring    Old records reviewed in assessment  of this patient  Antimicrobials:   DVT prophylaxis: enoxaparin (LOVENOX) injection 30 mg Start: 11/26/20 1045   Code Status:   Code Status: Full Code Family Communication: mom  Disposition Plan: Status is: Inpatient  Remains inpatient appropriate because:Ongoing active pain requiring inpatient pain management, IV treatments appropriate due to intensity of illness or inability to take PO, and Inpatient level of care appropriate due to severity of illness  Dispo: The patient is from: Home              Anticipated d/c is to: Home              Patient currently is not medically stable to d/c.   Difficult to place patient No  Risk of unplanned readmission score: Unplanned Admission- Pilot do not use: 16.69   Objective: Blood pressure 123/86, pulse 92, temperature 97.8 F (36.6 C), temperature source Oral, resp. rate 15, height 4' 9"  (1.448 m), weight 55.8 kg, last menstrual period 11/19/2020, SpO2 99 %.  Examination: General appearance: alert, cooperative, and no distress Head: Normocephalic, without obvious abnormality, atraumatic Eyes:  EOMI Lungs: clear to auscultation bilaterally Heart: regular rate and rhythm and S1, S2 normal Abdomen:  Mildly distended and swollen but no obvious tenderness to palpation.  Bowel sounds present. Extremities:  No edema Skin: mobility and turgor normal Neurologic: Grossly normal  Consultants:   Vascular surgery  Procedures:    Data Reviewed: I have personally reviewed following labs and imaging studies Results for orders placed or performed during the hospital encounter of 11/22/20 (from the past 24 hour(s))  Lipase, blood     Status: None   Collection Time: 11/25/20  5:09 PM  Result Value Ref Range   Lipase 21 11 - 51 U/L  Glucose, capillary     Status: Abnormal   Collection Time: 11/25/20  5:26 PM  Result Value Ref Range   Glucose-Capillary 170 (H) 70 - 99 mg/dL  Glucose, capillary     Status: Abnormal   Collection Time: 11/25/20   9:46 PM  Result Value Ref Range   Glucose-Capillary 191 (H) 70 - 99 mg/dL  Basic metabolic panel     Status: Abnormal   Collection Time: 11/26/20  4:08 AM  Result Value Ref Range   Sodium 131 (L) 135 - 145 mmol/L   Potassium 5.2 (H) 3.5 - 5.1 mmol/L   Chloride 100 98 - 111 mmol/L   CO2 14 (L) 22 - 32 mmol/L   Glucose, Bld 230 (H) 70 - 99 mg/dL   BUN 53 (H) 6 - 20 mg/dL   Creatinine, Ser 3.12 (H) 0.44 - 1.00 mg/dL   Calcium 9.3 8.9 - 10.3 mg/dL   GFR, Estimated 20 (L) >60 mL/min   Anion gap 17 (H) 5 - 15  CBC with Differential/Platelet     Status: Abnormal   Collection Time: 11/26/20  4:08 AM  Result Value Ref Range   WBC 9.2 4.0 - 10.5 K/uL   RBC 4.02 3.87 - 5.11 MIL/uL   Hemoglobin 11.5 (L) 12.0 - 15.0 g/dL   HCT 35.7 (L) 36.0 - 46.0 %   MCV 88.8 80.0 - 100.0 fL   MCH 28.6 26.0 - 34.0 pg   MCHC 32.2 30.0 - 36.0 g/dL   RDW 16.3 (H) 11.5 -  15.5 %   Platelets 191 150 - 400 K/uL   nRBC 0.0 0.0 - 0.2 %   Neutrophils Relative % 58 %   Neutro Abs 5.4 1.7 - 7.7 K/uL   Lymphocytes Relative 31 %   Lymphs Abs 2.8 0.7 - 4.0 K/uL   Monocytes Relative 8 %   Monocytes Absolute 0.7 0.1 - 1.0 K/uL   Eosinophils Relative 1 %   Eosinophils Absolute 0.1 0.0 - 0.5 K/uL   Basophils Relative 1 %   Basophils Absolute 0.1 0.0 - 0.1 K/uL   Immature Granulocytes 1 %   Abs Immature Granulocytes 0.05 0.00 - 0.07 K/uL  Magnesium     Status: Abnormal   Collection Time: 11/26/20  4:08 AM  Result Value Ref Range   Magnesium 2.5 (H) 1.7 - 2.4 mg/dL  Glucose, capillary     Status: Abnormal   Collection Time: 11/26/20  8:03 AM  Result Value Ref Range   Glucose-Capillary 179 (H) 70 - 99 mg/dL  Glucose, capillary     Status: Abnormal   Collection Time: 11/26/20 12:38 PM  Result Value Ref Range   Glucose-Capillary 235 (H) 70 - 99 mg/dL    Recent Results (from the past 240 hour(s))  Resp Panel by RT-PCR (Flu A&B, Covid) Nasopharyngeal Swab     Status: None   Collection Time: 11/23/20  1:25 AM    Specimen: Nasopharyngeal Swab; Nasopharyngeal(NP) swabs in vial transport medium  Result Value Ref Range Status   SARS Coronavirus 2 by RT PCR NEGATIVE NEGATIVE Final    Comment: (NOTE) SARS-CoV-2 target nucleic acids are NOT DETECTED.  The SARS-CoV-2 RNA is generally detectable in upper respiratory specimens during the acute phase of infection. The lowest concentration of SARS-CoV-2 viral copies this assay can detect is 138 copies/mL. A negative result does not preclude SARS-Cov-2 infection and should not be used as the sole basis for treatment or other patient management decisions. A negative result may occur with  improper specimen collection/handling, submission of specimen other than nasopharyngeal swab, presence of viral mutation(s) within the areas targeted by this assay, and inadequate number of viral copies(<138 copies/mL). A negative result must be combined with clinical observations, patient history, and epidemiological information. The expected result is Negative.  Fact Sheet for Patients:  EntrepreneurPulse.com.au  Fact Sheet for Healthcare Providers:  IncredibleEmployment.be  This test is no t yet approved or cleared by the [place] FDA and  has been authorized for detection and/or diagnosis of SARS-CoV-2 by FDA under an Emergency Use Authorization (EUA). This EUA will remain  in effect (meaning this test can be used) for the duration of the COVID-19 declaration under Section 564(b)(1) of the Act, 21 U.S.C.section 360bbb-3(b)(1), unless the authorization is terminated  or revoked sooner.       Influenza A by PCR NEGATIVE NEGATIVE Final   Influenza B by PCR NEGATIVE NEGATIVE Final    Comment: (NOTE) The Xpert Xpress SARS-CoV-2/FLU/RSV plus assay is intended as an aid in the diagnosis of influenza from Nasopharyngeal swab specimens and should not be used as a sole basis for treatment. Nasal washings and aspirates are  unacceptable for Xpert Xpress SARS-CoV-2/FLU/RSV testing.  Fact Sheet for Patients: EntrepreneurPulse.com.au  Fact Sheet for Healthcare Providers: IncredibleEmployment.be  This test is not yet approved or cleared by the [place] FDA and has been authorized for detection and/or diagnosis of SARS-CoV-2 by FDA under an Emergency Use Authorization (EUA). This EUA will remain in effect (meaning this test can be used) for  the duration of the COVID-19 declaration under Section 564(b)(1) of the Act, 21 U.S.C. section 360bbb-3(b)(1), unless the authorization is terminated or revoked.  Performed at [facility], [place] [address], [place], [place] [address]   Gastrointestinal Panel by PCR , Stool     Status: Abnormal   Collection Time: 11/24/20  3:50 PM   Specimen: Rectum; Stool  Result Value Ref Range Status   Campylobacter species NOT DETECTED NOT DETECTED Final   Plesimonas shigelloides NOT DETECTED NOT DETECTED Final   Salmonella species NOT DETECTED NOT DETECTED Final   Yersinia enterocolitica NOT DETECTED NOT DETECTED Final   Vibrio species NOT DETECTED NOT DETECTED Final   Vibrio cholerae NOT DETECTED NOT DETECTED Final   Enteroaggregative E coli (EAEC) NOT DETECTED NOT DETECTED Final   Enteropathogenic E coli (EPEC) DETECTED (A) NOT DETECTED Final    Comment: RESULT CALLED TO, READ BACK BY AND VERIFIED WITH: MEGAN BULLINS 11/26/20 1349 KLW    Enterotoxigenic E coli (ETEC) NOT DETECTED NOT DETECTED Final   Shiga like toxin producing E coli (STEC) NOT DETECTED NOT DETECTED Final   Shigella/Enteroinvasive E coli (EIEC) NOT DETECTED NOT DETECTED Final   Cryptosporidium NOT DETECTED NOT DETECTED Final   Cyclospora cayetanensis NOT DETECTED NOT DETECTED Final   Entamoeba histolytica NOT DETECTED NOT DETECTED Final   Giardia lamblia NOT DETECTED NOT DETECTED Final   Adenovirus F40/41 NOT DETECTED NOT DETECTED Final   Astrovirus  NOT DETECTED NOT DETECTED Final   Norovirus GI/GII NOT DETECTED NOT DETECTED Final   Rotavirus A NOT DETECTED NOT DETECTED Final   Sapovirus (I, II, IV, and V) NOT DETECTED NOT DETECTED Final    Comment: Performed at [facility], [address] [place], [place] [address]     Radiology Studies: No results found. CT Angio Abd/Pel w/ and/or w/o  Final Result    DG Chest 1 View  Final Result    CT ABDOMEN PELVIS W CONTRAST  Final Result      Scheduled Meds:  alum & mag hydroxide-simeth  30 mL Oral QID   And   lidocaine  15 mL Oral QID   dicyclomine  10 mg Oral TID AC   DULoxetine  60 mg Oral Daily   enoxaparin (LOVENOX) injection  30 mg Subcutaneous Q24H   famotidine  20 mg Oral BID   fenofibrate  160 mg Oral Daily   insulin aspart  0-15 Units Subcutaneous TID WC   insulin aspart  0-5 Units Subcutaneous QHS   insulin detemir  25 Units Subcutaneous BID   levothyroxine  150 mcg Oral Q0600   metoprolol  200 mg Oral Daily   metoprolol succinate  50 mg Oral Daily   norethindrone-ethinyl estradiol  1 tablet Oral Daily   pregabalin  300 mg Oral BID   rosuvastatin  40 mg Oral Daily   sacubitril-valsartan  1 tablet Oral BID   sodium chloride flush  3 mL Intravenous Q12H   PRN Meds: sodium chloride, acetaminophen **OR** acetaminophen, diphenhydrAMINE, ondansetron (ZOFRAN) IV, oxyCODONE, phenol, senna-docusate, sodium chloride flush Continuous Infusions:  sodium chloride     sodium chloride 75 mL/hr at 11/26/20 1500   ondansetron (ZOFRAN) IV       LOS: 3 days  Time spent: Greater than 50% of the 35 minute visit was spent in counseling/coordination of care for the patient as laid out in the A&P.   Dwyane Dee, MD [facility] 11/26/2020, 4:14 PM

## 2020-11-26 NOTE — Progress Notes (Signed)
Patient with complaint of not being able to void earlier this AM.  Bladder scan showing approx 220 ml.  Patient states she has urge to go and is uncomfortable.  MD made aware.  Orders received to I&O cath.  Cath performed using sterile technique and 300 cc yellow urine resulted.  Patient no more comfortable.  Will continue bladder scans Q6H.

## 2020-11-27 DIAGNOSIS — N179 Acute kidney failure, unspecified: Secondary | ICD-10-CM | POA: Diagnosis not present

## 2020-11-27 DIAGNOSIS — R109 Unspecified abdominal pain: Secondary | ICD-10-CM | POA: Diagnosis not present

## 2020-11-27 LAB — BASIC METABOLIC PANEL
Anion gap: 12 (ref 5–15)
BUN: 63 mg/dL — ABNORMAL HIGH (ref 6–20)
CO2: 14 mmol/L — ABNORMAL LOW (ref 22–32)
Calcium: 8.6 mg/dL — ABNORMAL LOW (ref 8.9–10.3)
Chloride: 103 mmol/L (ref 98–111)
Creatinine, Ser: 3.2 mg/dL — ABNORMAL HIGH (ref 0.44–1.00)
GFR, Estimated: 19 mL/min — ABNORMAL LOW (ref >60)
Glucose, Bld: 219 mg/dL — ABNORMAL HIGH (ref 70–99)
Potassium: 5.4 mmol/L — ABNORMAL HIGH (ref 3.5–5.1)
Sodium: 129 mmol/L — ABNORMAL LOW (ref 135–145)

## 2020-11-27 LAB — GLUCOSE, CAPILLARY
Glucose-Capillary: 186 mg/dL — ABNORMAL HIGH (ref 70–99)
Glucose-Capillary: 197 mg/dL — ABNORMAL HIGH (ref 70–99)
Glucose-Capillary: 199 mg/dL — ABNORMAL HIGH (ref 70–99)
Glucose-Capillary: 204 mg/dL — ABNORMAL HIGH (ref 70–99)
Glucose-Capillary: 204 mg/dL — ABNORMAL HIGH (ref 70–99)
Glucose-Capillary: 212 mg/dL — ABNORMAL HIGH (ref 70–99)

## 2020-11-27 LAB — CBC WITH DIFFERENTIAL/PLATELET
Abs Immature Granulocytes: 0.1 10*3/uL — ABNORMAL HIGH (ref 0.00–0.07)
Basophils Absolute: 0.1 10*3/uL (ref 0.0–0.1)
Basophils Relative: 1 %
Eosinophils Absolute: 0 10*3/uL (ref 0.0–0.5)
Eosinophils Relative: 0 %
HCT: 33.4 % — ABNORMAL LOW (ref 36.0–46.0)
Hemoglobin: 10.9 g/dL — ABNORMAL LOW (ref 12.0–15.0)
Immature Granulocytes: 1 %
Lymphocytes Relative: 19 %
Lymphs Abs: 2 10*3/uL (ref 0.7–4.0)
MCH: 28 pg (ref 26.0–34.0)
MCHC: 32.6 g/dL (ref 30.0–36.0)
MCV: 85.9 fL (ref 80.0–100.0)
Monocytes Absolute: 1.2 10*3/uL — ABNORMAL HIGH (ref 0.1–1.0)
Monocytes Relative: 11 %
Neutro Abs: 7.3 10*3/uL (ref 1.7–7.7)
Neutrophils Relative %: 68 %
Platelets: 156 10*3/uL (ref 150–400)
RBC: 3.89 MIL/uL (ref 3.87–5.11)
RDW: 15.9 % — ABNORMAL HIGH (ref 11.5–15.5)
WBC: 10.7 10*3/uL — ABNORMAL HIGH (ref 4.0–10.5)
nRBC: 0.2 % (ref 0.0–0.2)

## 2020-11-27 LAB — LIPASE, BLOOD: Lipase: 17 U/L (ref 11–51)

## 2020-11-27 LAB — MAGNESIUM: Magnesium: 2.4 mg/dL (ref 1.7–2.4)

## 2020-11-27 MED ORDER — HYDROMORPHONE HCL 1 MG/ML IJ SOLN
0.5000 mg | INTRAMUSCULAR | Status: DC | PRN
  Administered 2020-11-27 – 2020-11-28 (×5): 0.5 mg via INTRAVENOUS
  Filled 2020-11-27 (×2): qty 0.5
  Filled 2020-11-27: qty 1
  Filled 2020-11-27: qty 0.5
  Filled 2020-11-27: qty 1

## 2020-11-27 MED ORDER — DEXTROSE 5 % IV SOLN: INTRAVENOUS | Status: DC

## 2020-11-27 MED ORDER — OXYCODONE HCL 5 MG PO TABS
5.0000 mg | ORAL_TABLET | ORAL | Status: DC | PRN
Start: 2020-11-27 — End: 2020-11-28
  Administered 2020-11-27: 5 mg via ORAL
  Filled 2020-11-27 (×2): qty 1

## 2020-11-27 MED ORDER — IPRATROPIUM BROMIDE 0.02 % IN SOLN
0.5000 mg | Freq: Once | RESPIRATORY_TRACT | Status: AC
  Administered 2020-11-27: 0.5 mg via RESPIRATORY_TRACT
  Filled 2020-11-27: qty 2.5

## 2020-11-27 MED ORDER — INSULIN (MYXREDLIN) INFUSION FOR HYPERTRIGLYCERIDEMIA
0.0500 [IU]/kg/h | INTRAVENOUS | Status: DC
  Administered 2020-11-27: 0.1 [IU]/kg/h via INTRAVENOUS
  Administered 2020-11-28: 0.05 [IU]/kg/h via INTRAVENOUS
  Filled 2020-11-27 (×3): qty 100

## 2020-11-27 MED ORDER — CHLORHEXIDINE GLUCONATE CLOTH 2 % EX PADS
6.0000 | MEDICATED_PAD | Freq: Every day | CUTANEOUS | Status: DC
  Administered 2020-11-28 – 2020-12-06 (×8): 6 via TOPICAL

## 2020-11-27 NOTE — Progress Notes (Signed)
Inpatient Diabetes Program Recommendations  AACE/ADA: New Consensus Statement on Inpatient Glycemic Control (2015)  Target Ranges:  Prepandial:   less than 140 mg/dL      Peak postprandial:   less than 180 mg/dL (1-2 hours)      Critically ill patients:  140 - 180 mg/dL   Lab Results  Component Value Date   GLUCAP 204 (H) 11/27/2020   HGBA1C 7.8 (H) 11/23/2020    Review of Glycemic Control  Diabetes history: DM2 Outpatient Diabetes medications: Toujeo 94 units BID, Novolog 60 units TID Current orders for Inpatient glycemic control: Levemir 25 units BID, Novolog 0-15 units TID with meals and 0-5 HS  HgbA1C - 7.8%  Inpatient Diabetes Program Recommendations:    Increase Levemir to 27 units BID Add meal coverage insulin - Novolog 4 units TID with meals if eating > 50%  Continue to follow glucose trends.  Thank you. Lorenda Peck, RD, LDN, CDE Inpatient Diabetes Coordinator [phone]

## 2020-11-27 NOTE — Progress Notes (Signed)
Pt off BIPAP at this time and tolerating well.

## 2020-11-27 NOTE — Progress Notes (Signed)
Progress Note    Jennifer [name]   YCX:[id number]  DOB: [birth year]  DOA: 11/22/2020     4  PCP: Jennifer Lush, PA-C  Initial CC: abd pain  Hospital Course: Jennifer [name] is a 30 yo female with PMH astrocytoma, DMII, NAFLD, HTN, hypothyroidism, PCOS, sCHF, pancreatitis who presented with abdominal pain.  Her pain had been present for approximately 2 weeks prior to admission with progressive worsening.  There was also associated diarrhea but no nausea/vomiting.  She had similar pain in the past during prior pancreatitis episodes.  She has been compliant with Creon at home. She also follows outpatient with GI and has had a recent EGD. She also follows annually with neurosurgery for her history of astrocytoma as a child which required chemo and may have contributed to her underlying CHF.  She also follows with endocrinology and cardiology for her diabetes and CHF respectively.  She underwent work-up in the ER.  Lipase was normal and CT abdomen/pelvis showed no signs of pancreatitis. There was an area of "mild circumferential hypodensity along abdominal aorta and bilateral common iliac arteries" noted on CT abdomen/pelvis. She was evaluated by vascular surgery after admission as well and given imaging findings, there was no indication for intervention.  Interval History:  Abdominal pain was better yesterday but today has worsened.  She states that she is voiding well like normal.  Discussed that labs are still concerning today with slight worsening of creatinine still. She also became dizzy and was hypotensive when ambulating later today.  Medications were further adjusted after this. Yesterday she also did not tolerate fluids well and became short of breath and they were discontinued.  ROS: Constitutional: negative for chills and fevers, Respiratory: negative for cough, Cardiovascular: negative for chest pain, and Gastrointestinal: positive for abdominal pain  Assessment & Plan: *  Abdominal pain - unclear etiology currently. Differential on admission included vasculitis however CT findings appeared stable with comparison of prior; other considered differential is mesenteric ischemia given some association with meals but also with her underlying risk factors for vascular disease (also ruled out with CTA A/P on 8/30 and discussed findings with vascular as well) - continue soft diet - continue pain control - at this point, have ruled out enough serious etiologies; patient states has also had extensive outpt workup for this as well with no clear etiology - continue bentyl, tylenol, cymbalta, pepcid, lyrica - trial of GI cocktail in case related to GERD (does localize the worst spot in her epigastrium)  - of note has undergone gastric emptying study on 08/25/20 (53% at 4 hours, consistent with delayed gastric emptying)  AKI (acute kidney injury) (Jennifer [name]) - baseline creatinine ~ 0.8 - 1 - patient presents with increase in creat >0.3 mg/dL above baseline, creat increase >1.5x baseline presumed to have occurred within past 7 days PTA -Creatinine has been worsening in the past 1 to 2 days.  [name] also developed difficulty with voiding and required straight cath on 9/1 - she has since been able to void well; can stop bladder scans - creatinine continues to rise but rate of change today less than prior, hoping this is peaking and stabilizing (possibly ATN from BOO) - she did not tolerate IVF as they caused SOB also - hold IVF and hold Entresto and lasix - BMP daily   Chronic systolic CHF (congestive heart failure) (Jennifer [name]) - last echo: 03/2019: EF 40%, Gr 1 DD, Grade 3 MR, moderate TR. Was stable compared to 09/2018 echo - no  s/s exacerbation - continue home lasix and Entresto (on hold for now due to hypotension and dizziness as well as AKI)  Hypertriglyceridemia - Continue fenofibrate and Crestor - TG 4040, CHL 473; lipase negative x 3 and no pancreatitis noted on CT A/P - patient  has history of LPL deficiency and follows with [facility] endocrinology  Prolonged QT interval - QTC 494  Essential hypertension - Continue Toprol  Hypothyroidism - Continue Synthroid  DM (diabetes mellitus) (HCC) - A1c 7.8% on 11/23/20 - continue SSI and CBG monitoring    Old records reviewed in assessment of this patient  Antimicrobials:   DVT prophylaxis: heparin injection 5,000 Units Start: 11/27/20 1100   Code Status:   Code Status: Full Code Family Communication: mom  Disposition Plan: Status is: Inpatient  Remains inpatient appropriate because:Ongoing active pain requiring inpatient pain management, IV treatments appropriate due to intensity of illness or inability to take PO, and Inpatient level of care appropriate due to severity of illness  Dispo: The patient is from: Home              Anticipated d/c is to: Home              Patient currently is not medically stable to d/c.   Difficult to place patient No  Risk of unplanned readmission score: Unplanned Admission- Pilot do not use: 19.16   Objective: Blood pressure 109/83, pulse 88, temperature 97.8 F (36.6 C), temperature source Oral, resp. rate 20, height 4' 9"  (1.448 m), weight 55.8 kg, last menstrual period 11/19/2020, SpO2 100 %.  Examination: General appearance: alert, cooperative, and no distress Head: Normocephalic, without obvious abnormality, atraumatic Eyes:  EOMI Lungs: clear to auscultation bilaterally Heart: regular rate and rhythm and S1, S2 normal Abdomen:  Mildly distended and swollen but no obvious tenderness to palpation.  Bowel sounds present. Extremities:  No edema Skin: mobility and turgor normal Neurologic: Grossly normal  Consultants:   Vascular surgery  Procedures:    Data Reviewed: I have personally reviewed following labs and imaging studies Results for orders placed or performed during the hospital encounter of 11/22/20 (from the past 24 hour(s))  Glucose, capillary      Status: Abnormal   Collection Time: 11/26/20  6:03 PM  Result Value Ref Range   Glucose-Capillary 263 (H) 70 - 99 mg/dL  Glucose, capillary     Status: Abnormal   Collection Time: 11/26/20  8:43 PM  Result Value Ref Range   Glucose-Capillary 260 (H) 70 - 99 mg/dL  Basic metabolic panel     Status: Abnormal   Collection Time: 11/27/20  7:16 AM  Result Value Ref Range   Sodium 129 (L) 135 - 145 mmol/L   Potassium 5.4 (H) 3.5 - 5.1 mmol/L   Chloride 103 98 - 111 mmol/L   CO2 14 (L) 22 - 32 mmol/L   Glucose, Bld 219 (H) 70 - 99 mg/dL   BUN 63 (H) 6 - 20 mg/dL   Creatinine, Ser 3.20 (H) 0.44 - 1.00 mg/dL   Calcium 8.6 (L) 8.9 - 10.3 mg/dL   GFR, Estimated 19 (L) >60 mL/min   Anion gap 12 5 - 15  CBC with Differential/Platelet     Status: Abnormal   Collection Time: 11/27/20  7:16 AM  Result Value Ref Range   WBC 10.7 (H) 4.0 - 10.5 K/uL   RBC 3.89 3.87 - 5.11 MIL/uL   Hemoglobin 10.9 (L) 12.0 - 15.0 g/dL   HCT 33.4 (L) 36.0 - 46.0 %  MCV 85.9 80.0 - 100.0 fL   MCH 28.0 26.0 - 34.0 pg   MCHC 32.6 30.0 - 36.0 g/dL   RDW 15.9 (H) 11.5 - 15.5 %   Platelets 156 150 - 400 K/uL   nRBC 0.2 0.0 - 0.2 %   Neutrophils Relative % 68 %   Neutro Abs 7.3 1.7 - 7.7 K/uL   Lymphocytes Relative 19 %   Lymphs Abs 2.0 0.7 - 4.0 K/uL   Monocytes Relative 11 %   Monocytes Absolute 1.2 (H) 0.1 - 1.0 K/uL   Eosinophils Relative 0 %   Eosinophils Absolute 0.0 0.0 - 0.5 K/uL   Basophils Relative 1 %   Basophils Absolute 0.1 0.0 - 0.1 K/uL   Immature Granulocytes 1 %   Abs Immature Granulocytes 0.10 (H) 0.00 - 0.07 K/uL  Magnesium     Status: None   Collection Time: 11/27/20  7:16 AM  Result Value Ref Range   Magnesium 2.4 1.7 - 2.4 mg/dL  Glucose, capillary     Status: Abnormal   Collection Time: 11/27/20  7:38 AM  Result Value Ref Range   Glucose-Capillary 204 (H) 70 - 99 mg/dL  Lipase, blood     Status: None   Collection Time: 11/27/20 10:38 AM  Result Value Ref Range   Lipase 17 11 - 51  U/L  Glucose, capillary     Status: Abnormal   Collection Time: 11/27/20 12:22 PM  Result Value Ref Range   Glucose-Capillary 204 (H) 70 - 99 mg/dL    Recent Results (from the past 240 hour(s))  Resp Panel by RT-PCR (Flu A&B, Covid) Nasopharyngeal Swab     Status: None   Collection Time: 11/23/20  1:25 AM   Specimen: Nasopharyngeal Swab; Nasopharyngeal(NP) swabs in vial transport medium  Result Value Ref Range Status   SARS Coronavirus 2 by RT PCR NEGATIVE NEGATIVE Final    Comment: (NOTE) SARS-CoV-2 target nucleic acids are NOT DETECTED.  The SARS-CoV-2 RNA is generally detectable in upper respiratory specimens during the acute phase of infection. The lowest concentration of SARS-CoV-2 viral copies this assay can detect is 138 copies/mL. A negative result does not preclude SARS-Cov-2 infection and should not be used as the sole basis for treatment or other patient management decisions. A negative result may occur with  improper specimen collection/handling, submission of specimen other than nasopharyngeal swab, presence of viral mutation(s) within the areas targeted by this assay, and inadequate number of viral copies(<138 copies/mL). A negative result must be combined with clinical observations, patient history, and epidemiological information. The expected result is Negative.  Fact Sheet for Patients:  EntrepreneurPulse.com.au  Fact Sheet for Healthcare Providers:  IncredibleEmployment.be  This test is no t yet approved or cleared by the [place] FDA and  has been authorized for detection and/or diagnosis of SARS-CoV-2 by FDA under an Emergency Use Authorization (EUA). This EUA will remain  in effect (meaning this test can be used) for the duration of the COVID-19 declaration under Section 564(b)(1) of the Act, 21 U.S.C.section 360bbb-3(b)(1), unless the authorization is terminated  or revoked sooner.       Influenza A by PCR  NEGATIVE NEGATIVE Final   Influenza B by PCR NEGATIVE NEGATIVE Final    Comment: (NOTE) The Xpert Xpress SARS-CoV-2/FLU/RSV plus assay is intended as an aid in the diagnosis of influenza from Nasopharyngeal swab specimens and should not be used as a sole basis for treatment. Nasal washings and aspirates are unacceptable for Xpert Xpress SARS-CoV-2/FLU/RSV  testing.  Fact Sheet for Patients: EntrepreneurPulse.com.au  Fact Sheet for Healthcare Providers: IncredibleEmployment.be  This test is not yet approved or cleared by the [place] FDA and has been authorized for detection and/or diagnosis of SARS-CoV-2 by FDA under an Emergency Use Authorization (EUA). This EUA will remain in effect (meaning this test can be used) for the duration of the COVID-19 declaration under Section 564(b)(1) of the Act, 21 U.S.C. section 360bbb-3(b)(1), unless the authorization is terminated or revoked.  Performed at [facility], [place] [address] [place], [place] [address]   Gastrointestinal Panel by PCR , Stool     Status: Abnormal   Collection Time: 11/24/20  3:50 PM   Specimen: Rectum; Stool  Result Value Ref Range Status   Campylobacter species NOT DETECTED NOT DETECTED Final   Plesimonas shigelloides NOT DETECTED NOT DETECTED Final   Salmonella species NOT DETECTED NOT DETECTED Final   Yersinia enterocolitica NOT DETECTED NOT DETECTED Final   Vibrio species NOT DETECTED NOT DETECTED Final   Vibrio cholerae NOT DETECTED NOT DETECTED Final   Enteroaggregative E coli (EAEC) NOT DETECTED NOT DETECTED Final   Enteropathogenic E coli (EPEC) DETECTED (A) NOT DETECTED Final    Comment: RESULT CALLED TO, READ BACK BY AND VERIFIED WITH: MEGAN BULLINS 11/26/20 1349 KLW    Enterotoxigenic E coli (ETEC) NOT DETECTED NOT DETECTED Final   Shiga like toxin producing E coli (STEC) NOT DETECTED NOT DETECTED Final   Shigella/Enteroinvasive E coli (EIEC) NOT  DETECTED NOT DETECTED Final   Cryptosporidium NOT DETECTED NOT DETECTED Final   Cyclospora cayetanensis NOT DETECTED NOT DETECTED Final   Entamoeba histolytica NOT DETECTED NOT DETECTED Final   Giardia lamblia NOT DETECTED NOT DETECTED Final   Adenovirus F40/41 NOT DETECTED NOT DETECTED Final   Astrovirus NOT DETECTED NOT DETECTED Final   Norovirus GI/GII NOT DETECTED NOT DETECTED Final   Rotavirus A NOT DETECTED NOT DETECTED Final   Sapovirus (I, II, IV, and V) NOT DETECTED NOT DETECTED Final    Comment: Performed at [facility], [address], [place], [place] [address]     Radiology Studies: No results found. CT Angio Abd/Pel w/ and/or w/o  Final Result    DG Chest 1 View  Final Result    CT ABDOMEN PELVIS W CONTRAST  Final Result      Scheduled Meds:  dicyclomine  10 mg Oral TID AC   DULoxetine  60 mg Oral Daily   famotidine  10 mg Oral Daily   fenofibrate  160 mg Oral Daily   heparin injection (subcutaneous)  5,000 Units Subcutaneous Q8H   insulin aspart  0-15 Units Subcutaneous TID WC   insulin aspart  0-5 Units Subcutaneous QHS   insulin detemir  25 Units Subcutaneous BID   levothyroxine  150 mcg Oral Q0600   lidocaine  15 mL Oral QID   norethindrone-ethinyl estradiol  1 tablet Oral Daily   pregabalin  75 mg Oral BID   rosuvastatin  40 mg Oral Daily   sodium chloride flush  3 mL Intravenous Q12H   PRN Meds: sodium chloride, acetaminophen **OR** acetaminophen, diphenhydrAMINE, HYDROmorphone (DILAUDID) injection, ondansetron (ZOFRAN) IV, oxyCODONE, phenol, senna-docusate, sodium chloride flush Continuous Infusions:  sodium chloride     ondansetron (ZOFRAN) IV 8 mg (11/27/20 1110)     LOS: 4 days  Time spent: Greater than 50% of the 35 minute visit was spent in counseling/coordination of care for the patient as laid out in the A&P.  Dwyane Dee, MD [facility] 11/27/2020, 3:07 PM

## 2020-11-27 NOTE — Evaluation (Signed)
Physical Therapy Evaluation Patient Details Name: Jennifer [name] MRN: [id number] DOB: [birth year] Today's Date: 11/27/2020   History of Present Illness  Pt is a 30 yo female with PMH astrocytoma s/p brain surgery, DMII, NAFLD, HTN, hypothyroidism, PCOS, sCHF, pancreatitis who presented 11/22/20 with abdominal pain.  Clinical Impression  Pt admitted with above diagnosis. Pt currently with functional limitations due to the deficits listed below (see PT Problem List). Pt will benefit from skilled PT to increase their independence and safety with mobility to allow discharge to the venue listed below.  Pt eager to mobilize stating she has only been OOB to bathroom during admission.  Pt c/o dizziness upon returning to room and had low BP upon sitting in recliner (as below).  RN called to room after second BP still reading low, and pt left in recliner with RN.  Pt does have a flight to enter her apt and hopes to no longer need RW upon d/c.   11/27/20 1445  Vital Signs  Pulse Rate 70  Pulse Rate Source Dinamap  BP (!) 75/47  BP Location Right Arm  BP Method Automatic  Patient Position (if appropriate)  (in recliner after ambulating with c/o dizziness)  Oxygen Therapy  SpO2 100 %  O2 Device Room Air        Follow Up Recommendations Outpatient PT;Supervision for mobility/OOB (would benefit from OPPT if pt agreeable)    Equipment Recommendations  Rolling walker with 5" wheels    Recommendations for Other Services       Precautions / Restrictions Precautions Precautions: Fall Precaution Comments: monitor BP with mobility, residual left sided deficits      Mobility  Bed Mobility               General bed mobility comments: pt in recliner    Transfers Overall transfer level: Needs assistance Equipment used: Rolling walker (2 wheeled) Transfers: Sit to/from Stand Sit to Stand: Min assist         General transfer comment: min/guard for rise however min assist due to  dizziness and weakness after ambulating for pt to descent to recliner  Ambulation/Gait Ambulation/Gait assistance: Min assist Gait Distance (Feet): 80 Feet Assistive device: Rolling walker (2 wheeled) Gait Pattern/deviations: Step-through pattern;Decreased stride length     General Gait Details: initially min/guard however pt c/o dizziness approx halfway with ambulating, pt felt able to return to room however required min assist for stability and weakness; BP low upon sitting in recliner; RN called into room to assess  Stairs            Wheelchair Mobility    Modified Rankin (Stroke Patients Only)       Balance Overall balance assessment: Mild deficits observed, not formally tested                                           Pertinent Vitals/Pain Pain Assessment: No/denies pain    Home Living Family/patient expects to be discharged to:: Private residence Living Arrangements: Spouse/significant other   Type of Home: Apartment Home Access: Stairs to enter   Technical brewer of Steps: flight Home Layout: One level Home Equipment: None      Prior Function Level of Independence: Independent               Hand Dominance        Extremity/Trunk Assessment   Upper Extremity Assessment  Upper Extremity Assessment: LUE deficits/detail LUE Deficits / Details: L UE flexion contracture and deficits    Lower Extremity Assessment Lower Extremity Assessment: Generalized weakness;LLE deficits/detail LLE Deficits / Details: pt reports hx of left sided weakness but typically able to ambulate without assistive device       Communication   Communication: No difficulties  Cognition Arousal/Alertness: Awake/alert Behavior During Therapy: WFL for tasks assessed/performed Overall Cognitive Status: Within Functional Limits for tasks assessed                                        General Comments      Exercises      Assessment/Plan    PT Assessment Patient needs continued PT services  PT Problem List Decreased strength;Decreased activity tolerance;Decreased knowledge of use of DME;Decreased balance;Decreased mobility;Cardiopulmonary status limiting activity       PT Treatment Interventions DME instruction;Gait training;Balance training;Therapeutic exercise;Stair training;Functional mobility training;Therapeutic activities;Patient/family education    PT Goals (Current goals can be found in the Care Plan section)  Acute Rehab PT Goals Patient Stated Goal: "to get away from using walker before going home" PT Goal Formulation: With patient Time For Goal Achievement: 12/11/20 Potential to Achieve Goals: Good    Frequency Min 3X/week   Barriers to discharge        Co-evaluation               AM-PAC PT "6 Clicks" Mobility  Outcome Measure Help needed turning from your back to your side while in a flat bed without using bedrails?: A Little Help needed moving from lying on your back to sitting on the side of a flat bed without using bedrails?: A Little Help needed moving to and from a bed to a chair (including a wheelchair)?: A Little Help needed standing up from a chair using your arms (e.g., wheelchair or bedside chair)?: A Little Help needed to walk in hospital room?: A Lot Help needed climbing 3-5 steps with a railing? : A Lot 6 Click Score: 16    End of Session Equipment Utilized During Treatment: Gait belt Activity Tolerance: Treatment limited secondary to medical complications (Comment) (drop in BP) Patient left: in chair;with nursing/sitter in room;with call bell/phone within reach;with chair alarm set Nurse Communication: Mobility status PT Visit Diagnosis: Difficulty in walking, not elsewhere classified (R26.2);Unsteadiness on feet (R26.81)    Time: 8299-3716 PT Time Calculation (min) (ACUTE ONLY): 15 min   Charges:   PT Evaluation $PT Eval Low Complexity: 1 Low         Kati PT, DPT Acute Rehabilitation Services Pager: [phone] Office: [phone]   York Ram E 11/27/2020, 3:28 PM

## 2020-11-27 NOTE — Plan of Care (Signed)
  Problem: Clinical Measurements: Goal: Cardiovascular complication will be avoided Outcome: Progressing   Problem: Nutrition: Goal: Adequate nutrition will be maintained Outcome: Progressing   Problem: Pain Managment: Goal: General experience of comfort will improve Outcome: Progressing

## 2020-11-27 NOTE — Progress Notes (Signed)
    OVERNIGHT PROGRESS REPORT  Notified by RN for difficulty breathing  Upon arrival into room patient was conversational on [place] with SPO2 of 100% Pt was side lying. Patient able to sit up on her own   Patient does describe difficulty in breathing  RN has stopped fluid appropriately  Non QT affecting breathing treatment ordered (due to indications in Notes of prolonged QT synd) with some relief   PRN BiPAP ordered and in use. Lasix has previously been limited due to : Results for SHAREEKA, YIM (MRN [id number]) as of 11/27/2020 05:38  Ref. Range 11/26/2020 04:08  BUN Latest Ref Range: 6 - 20 mg/dL 53 (H)  Creatinine Latest Ref Range: 0.44 - 1.00 mg/dL 3.12 (H)    [name] reclined in bed now,  resting    Gershon Cull MSNA MSN ACNPC-AG Acute Care Nurse Practitioner [place]

## 2020-11-28 ENCOUNTER — Inpatient Hospital Stay (HOSPITAL_COMMUNITY): Payer: BC Managed Care – PPO

## 2020-11-28 ENCOUNTER — Inpatient Hospital Stay: Payer: Self-pay

## 2020-11-28 DIAGNOSIS — R570 Cardiogenic shock: Secondary | ICD-10-CM

## 2020-11-28 DIAGNOSIS — I5041 Acute combined systolic (congestive) and diastolic (congestive) heart failure: Secondary | ICD-10-CM

## 2020-11-28 DIAGNOSIS — I5022 Chronic systolic (congestive) heart failure: Secondary | ICD-10-CM

## 2020-11-28 DIAGNOSIS — I428 Other cardiomyopathies: Secondary | ICD-10-CM

## 2020-11-28 DIAGNOSIS — I959 Hypotension, unspecified: Secondary | ICD-10-CM

## 2020-11-28 DIAGNOSIS — E781 Pure hyperglyceridemia: Secondary | ICD-10-CM | POA: Diagnosis not present

## 2020-11-28 DIAGNOSIS — I509 Heart failure, unspecified: Secondary | ICD-10-CM | POA: Diagnosis not present

## 2020-11-28 DIAGNOSIS — N179 Acute kidney failure, unspecified: Secondary | ICD-10-CM | POA: Diagnosis not present

## 2020-11-28 DIAGNOSIS — R748 Abnormal levels of other serum enzymes: Secondary | ICD-10-CM

## 2020-11-28 DIAGNOSIS — I951 Orthostatic hypotension: Secondary | ICD-10-CM

## 2020-11-28 DIAGNOSIS — R109 Unspecified abdominal pain: Secondary | ICD-10-CM

## 2020-11-28 DIAGNOSIS — I5021 Acute systolic (congestive) heart failure: Secondary | ICD-10-CM

## 2020-11-28 LAB — GLUCOSE, CAPILLARY
Glucose-Capillary: 157 mg/dL — ABNORMAL HIGH (ref 70–99)
Glucose-Capillary: 146 mg/dL — ABNORMAL HIGH (ref 70–99)
Glucose-Capillary: 136 mg/dL — ABNORMAL HIGH (ref 70–99)
Glucose-Capillary: 127 mg/dL — ABNORMAL HIGH (ref 70–99)
Glucose-Capillary: 102 mg/dL — ABNORMAL HIGH (ref 70–99)
Glucose-Capillary: 94 mg/dL (ref 70–99)
Glucose-Capillary: 94 mg/dL (ref 70–99)
Glucose-Capillary: 76 mg/dL (ref 70–99)
Glucose-Capillary: 70 mg/dL (ref 70–99)
Glucose-Capillary: 85 mg/dL (ref 70–99)
Glucose-Capillary: 84 mg/dL (ref 70–99)
Glucose-Capillary: 75 mg/dL (ref 70–99)
Glucose-Capillary: 99 mg/dL (ref 70–99)
Glucose-Capillary: 98 mg/dL (ref 70–99)
Glucose-Capillary: 117 mg/dL — ABNORMAL HIGH (ref 70–99)
Glucose-Capillary: 127 mg/dL — ABNORMAL HIGH (ref 70–99)
Glucose-Capillary: 188 mg/dL — ABNORMAL HIGH (ref 70–99)
Glucose-Capillary: 190 mg/dL — ABNORMAL HIGH (ref 70–99)
Glucose-Capillary: 167 mg/dL — ABNORMAL HIGH (ref 70–99)
Glucose-Capillary: 193 mg/dL — ABNORMAL HIGH (ref 70–99)
Glucose-Capillary: 171 mg/dL — ABNORMAL HIGH (ref 70–99)
Glucose-Capillary: 186 mg/dL — ABNORMAL HIGH (ref 70–99)

## 2020-11-28 LAB — COMPREHENSIVE METABOLIC PANEL
ALT: 1914 U/L — ABNORMAL HIGH (ref 0–44)
AST: 2519 U/L — ABNORMAL HIGH (ref 15–41)
Albumin: 3.3 g/dL — ABNORMAL LOW (ref 3.5–5.0)
Alkaline Phosphatase: 105 U/L (ref 38–126)
Anion gap: 13 (ref 5–15)
BUN: 74 mg/dL — ABNORMAL HIGH (ref 6–20)
CO2: 14 mmol/L — ABNORMAL LOW (ref 22–32)
Calcium: 7.9 mg/dL — ABNORMAL LOW (ref 8.9–10.3)
Chloride: 103 mmol/L (ref 98–111)
Creatinine, Ser: 3.01 mg/dL — ABNORMAL HIGH (ref 0.44–1.00)
GFR, Estimated: 21 mL/min — ABNORMAL LOW (ref >60)
Glucose, Bld: 163 mg/dL — ABNORMAL HIGH (ref 70–99)
Potassium: 4.8 mmol/L (ref 3.5–5.1)
Sodium: 130 mmol/L — ABNORMAL LOW (ref 135–145)
Total Bilirubin: 2.5 mg/dL — ABNORMAL HIGH (ref 0.3–1.2)
Total Protein: 6.5 g/dL (ref 6.5–8.1)

## 2020-11-28 LAB — BASIC METABOLIC PANEL
Anion gap: 10 (ref 5–15)
Anion gap: 12 (ref 5–15)
Anion gap: 12 (ref 5–15)
Anion gap: 13 (ref 5–15)
Anion gap: 13 (ref 5–15)
BUN: 65 mg/dL — ABNORMAL HIGH (ref 6–20)
BUN: 67 mg/dL — ABNORMAL HIGH (ref 6–20)
BUN: 68 mg/dL — ABNORMAL HIGH (ref 6–20)
BUN: 73 mg/dL — ABNORMAL HIGH (ref 6–20)
BUN: 78 mg/dL — ABNORMAL HIGH (ref 6–20)
CO2: 13 mmol/L — ABNORMAL LOW (ref 22–32)
CO2: 14 mmol/L — ABNORMAL LOW (ref 22–32)
CO2: 14 mmol/L — ABNORMAL LOW (ref 22–32)
CO2: 16 mmol/L — ABNORMAL LOW (ref 22–32)
CO2: 17 mmol/L — ABNORMAL LOW (ref 22–32)
Calcium: 7.6 mg/dL — ABNORMAL LOW (ref 8.9–10.3)
Calcium: 7.8 mg/dL — ABNORMAL LOW (ref 8.9–10.3)
Calcium: 7.9 mg/dL — ABNORMAL LOW (ref 8.9–10.3)
Calcium: 8.1 mg/dL — ABNORMAL LOW (ref 8.9–10.3)
Calcium: 8.1 mg/dL — ABNORMAL LOW (ref 8.9–10.3)
Chloride: 102 mmol/L (ref 98–111)
Chloride: 103 mmol/L (ref 98–111)
Chloride: 98 mmol/L (ref 98–111)
Chloride: 99 mmol/L (ref 98–111)
Chloride: 99 mmol/L (ref 98–111)
Creatinine, Ser: 3.4 mg/dL — ABNORMAL HIGH (ref 0.44–1.00)
Creatinine, Ser: 3.67 mg/dL — ABNORMAL HIGH (ref 0.44–1.00)
Creatinine, Ser: 3.73 mg/dL — ABNORMAL HIGH (ref 0.44–1.00)
Creatinine, Ser: 3.75 mg/dL — ABNORMAL HIGH (ref 0.44–1.00)
Creatinine, Ser: 3.91 mg/dL — ABNORMAL HIGH (ref 0.44–1.00)
GFR, Estimated: 15 mL/min — ABNORMAL LOW (ref >60)
GFR, Estimated: 16 mL/min — ABNORMAL LOW (ref >60)
GFR, Estimated: 16 mL/min — ABNORMAL LOW (ref >60)
GFR, Estimated: 16 mL/min — ABNORMAL LOW (ref >60)
GFR, Estimated: 18 mL/min — ABNORMAL LOW (ref >60)
Glucose, Bld: 102 mg/dL — ABNORMAL HIGH (ref 70–99)
Glucose, Bld: 135 mg/dL — ABNORMAL HIGH (ref 70–99)
Glucose, Bld: 202 mg/dL — ABNORMAL HIGH (ref 70–99)
Glucose, Bld: 218 mg/dL — ABNORMAL HIGH (ref 70–99)
Glucose, Bld: 71 mg/dL (ref 70–99)
Potassium: 4.5 mmol/L (ref 3.5–5.1)
Potassium: 4.6 mmol/L (ref 3.5–5.1)
Potassium: 4.7 mmol/L (ref 3.5–5.1)
Potassium: 4.8 mmol/L (ref 3.5–5.1)
Potassium: 5.5 mmol/L — ABNORMAL HIGH (ref 3.5–5.1)
Sodium: 126 mmol/L — ABNORMAL LOW (ref 135–145)
Sodium: 126 mmol/L — ABNORMAL LOW (ref 135–145)
Sodium: 126 mmol/L — ABNORMAL LOW (ref 135–145)
Sodium: 128 mmol/L — ABNORMAL LOW (ref 135–145)
Sodium: 129 mmol/L — ABNORMAL LOW (ref 135–145)

## 2020-11-28 LAB — CBC WITH DIFFERENTIAL/PLATELET
Abs Immature Granulocytes: 0.12 10*3/uL — ABNORMAL HIGH (ref 0.00–0.07)
Basophils Absolute: 0 10*3/uL (ref 0.0–0.1)
Basophils Relative: 0 %
Eosinophils Absolute: 0 10*3/uL (ref 0.0–0.5)
Eosinophils Relative: 0 %
HCT: 32.2 % — ABNORMAL LOW (ref 36.0–46.0)
Hemoglobin: 10.2 g/dL — ABNORMAL LOW (ref 12.0–15.0)
Immature Granulocytes: 1 %
Lymphocytes Relative: 21 %
Lymphs Abs: 2.1 10*3/uL (ref 0.7–4.0)
MCH: 28.2 pg (ref 26.0–34.0)
MCHC: 31.7 g/dL (ref 30.0–36.0)
MCV: 89 fL (ref 80.0–100.0)
Monocytes Absolute: 0.9 10*3/uL (ref 0.1–1.0)
Monocytes Relative: 9 %
Neutro Abs: 6.7 10*3/uL (ref 1.7–7.7)
Neutrophils Relative %: 69 %
Platelets: 119 10*3/uL — ABNORMAL LOW (ref 150–400)
RBC: 3.62 MIL/uL — ABNORMAL LOW (ref 3.87–5.11)
RDW: 16.4 % — ABNORMAL HIGH (ref 11.5–15.5)
WBC: 9.8 10*3/uL (ref 4.0–10.5)
nRBC: 0.3 % — ABNORMAL HIGH (ref 0.0–0.2)

## 2020-11-28 LAB — LIPID PANEL
Cholesterol: 497 mg/dL — ABNORMAL HIGH (ref 0–200)
Cholesterol: 504 mg/dL — ABNORMAL HIGH (ref 0–200)
HDL: 22 mg/dL — ABNORMAL LOW (ref >40)
Total CHOL/HDL Ratio: 22.9 RATIO
Triglycerides: 1215 mg/dL — ABNORMAL HIGH (ref <150)
Triglycerides: 989 mg/dL — ABNORMAL HIGH (ref <150)
VLDL: UNDETERMINED mg/dL (ref 0–40)
VLDL: UNDETERMINED mg/dL (ref 0–40)

## 2020-11-28 LAB — HEPATIC FUNCTION PANEL
ALT: 2075 U/L — ABNORMAL HIGH (ref 0–44)
AST: 2837 U/L — ABNORMAL HIGH (ref 15–41)
Albumin: 3.5 g/dL (ref 3.5–5.0)
Alkaline Phosphatase: 109 U/L (ref 38–126)
Bilirubin, Direct: 1.7 mg/dL — ABNORMAL HIGH (ref 0.0–0.2)
Indirect Bilirubin: 0.9 mg/dL (ref 0.3–0.9)
Total Bilirubin: 2.6 mg/dL — ABNORMAL HIGH (ref 0.3–1.2)
Total Protein: 6.6 g/dL (ref 6.5–8.1)

## 2020-11-28 LAB — MRSA NEXT GEN BY PCR, NASAL: MRSA by PCR Next Gen: NOT DETECTED

## 2020-11-28 LAB — ECHOCARDIOGRAM COMPLETE
AR max vel: 2.18 cm2
AV Area VTI: 1.93 cm2
AV Area mean vel: 2.04 cm2
AV Mean grad: 3 mmHg
AV Peak grad: 4.8 mmHg
AV Vena cont: 0.2 cm
Ao pk vel: 1.09 m/s
Area-P 1/2: 6.27 cm2
Height: 57 in
MV Vena cont: 0.3 cm
P 1/2 time: 367 msec
Radius: 0.4 cm
S' Lateral: 3.8 cm
Single Plane A4C EF: 27 %
Weight: 2081.14 oz

## 2020-11-28 LAB — PROTIME-INR
INR: 1.5 — ABNORMAL HIGH (ref 0.8–1.2)
Prothrombin Time: 17.9 seconds — ABNORMAL HIGH (ref 11.4–15.2)

## 2020-11-28 LAB — T4, FREE: Free T4: 1.05 ng/dL (ref 0.61–1.12)

## 2020-11-28 LAB — BRAIN NATRIURETIC PEPTIDE: B Natriuretic Peptide: 529.6 pg/mL — ABNORMAL HIGH (ref 0.0–100.0)

## 2020-11-28 LAB — TSH: TSH: 0.098 u[IU]/mL — ABNORMAL LOW (ref 0.350–4.500)

## 2020-11-28 LAB — APTT: aPTT: 36 seconds (ref 24–36)

## 2020-11-28 LAB — AMMONIA: Ammonia: 67 umol/L — ABNORMAL HIGH (ref 9–35)

## 2020-11-28 LAB — CORTISOL-AM, BLOOD: Cortisol - AM: 41.5 ug/dL — ABNORMAL HIGH (ref 6.7–22.6)

## 2020-11-28 LAB — LACTIC ACID, PLASMA: Lactic Acid, Venous: 1.3 mmol/L (ref 0.5–1.9)

## 2020-11-28 LAB — MAGNESIUM: Magnesium: 2.6 mg/dL — ABNORMAL HIGH (ref 1.7–2.4)

## 2020-11-28 MED ORDER — DEXTROSE-NACL 5-0.9 % IV SOLN: INTRAVENOUS | Status: DC

## 2020-11-28 MED ORDER — LACTULOSE 10 GM/15ML PO SOLN
20.0000 g | Freq: Three times a day (TID) | ORAL | Status: DC
  Administered 2020-11-28 – 2020-11-30 (×6): 20 g via ORAL
  Filled 2020-11-28 (×7): qty 30

## 2020-11-28 MED ORDER — NOREPINEPHRINE 4 MG/250ML-% IV SOLN: 2.0000 ug/min | INTRAVENOUS | Status: DC

## 2020-11-28 MED ORDER — HYDROMORPHONE HCL 1 MG/ML IJ SOLN
0.2500 mg | INTRAMUSCULAR | Status: DC | PRN
  Administered 2020-11-28 – 2020-12-04 (×25): 0.25 mg via INTRAVENOUS
  Filled 2020-11-28 (×2): qty 0.5
  Filled 2020-11-28: qty 1
  Filled 2020-11-28 (×2): qty 0.5
  Filled 2020-11-28: qty 1
  Filled 2020-11-28 (×8): qty 0.5
  Filled 2020-11-28: qty 1
  Filled 2020-11-28 (×9): qty 0.5
  Filled 2020-11-28: qty 1

## 2020-11-28 MED ORDER — METHYLPREDNISOLONE SODIUM SUCC 125 MG IJ SOLR
125.0000 mg | Freq: Once | INTRAMUSCULAR | Status: AC
  Administered 2020-11-28: 125 mg via INTRAVENOUS
  Filled 2020-11-28: qty 2

## 2020-11-28 MED ORDER — ONDANSETRON HCL 4 MG/2ML IJ SOLN: 4.0000 mg | Freq: Four times a day (QID) | INTRAMUSCULAR | Status: DC

## 2020-11-28 MED ORDER — DOBUTAMINE IN D5W 4-5 MG/ML-% IV SOLN
2.5000 ug/kg/min | INTRAVENOUS | Status: DC
  Administered 2020-11-28 – 2020-11-29 (×2): 2.5 ug/kg/min via INTRAVENOUS
  Filled 2020-11-28 (×2): qty 250

## 2020-11-28 MED ORDER — ONDANSETRON HCL 4 MG/2ML IJ SOLN
4.0000 mg | Freq: Four times a day (QID) | INTRAMUSCULAR | Status: DC | PRN
  Administered 2020-11-30 – 2020-12-02 (×2): 4 mg via INTRAVENOUS
  Filled 2020-11-28 (×2): qty 2

## 2020-11-28 MED ORDER — PROCHLORPERAZINE EDISYLATE 10 MG/2ML IJ SOLN
10.0000 mg | Freq: Four times a day (QID) | INTRAMUSCULAR | Status: DC | PRN
  Filled 2020-11-28: qty 2

## 2020-11-28 MED ORDER — SODIUM BICARBONATE 650 MG PO TABS
650.0000 mg | ORAL_TABLET | Freq: Three times a day (TID) | ORAL | Status: DC
  Administered 2020-11-28 – 2020-12-03 (×16): 650 mg via ORAL
  Filled 2020-11-28 (×16): qty 1

## 2020-11-28 MED ORDER — SODIUM CHLORIDE 0.9% FLUSH: 10.0000 mL | INTRAVENOUS | Status: DC | PRN

## 2020-11-28 MED ORDER — SODIUM CHLORIDE 0.9 % IV SOLN
250.0000 mL | INTRAVENOUS | Status: DC
Start: 2020-11-28 — End: 2020-12-06
  Administered 2020-12-01: 250 mL via INTRAVENOUS

## 2020-11-28 MED ORDER — LEVOTHYROXINE SODIUM 25 MCG PO TABS
125.0000 ug | ORAL_TABLET | Freq: Every day | ORAL | Status: DC
  Administered 2020-11-29 – 2020-12-06 (×8): 125 ug via ORAL
  Filled 2020-11-28 (×8): qty 1

## 2020-11-28 MED ORDER — SODIUM CHLORIDE 0.9% FLUSH
10.0000 mL | Freq: Two times a day (BID) | INTRAVENOUS | Status: DC
  Administered 2020-11-28 – 2020-11-29 (×2): 10 mL
  Administered 2020-11-29: 20 mL
  Administered 2020-12-01 – 2020-12-04 (×4): 10 mL
  Administered 2020-12-05: 30 mL
  Administered 2020-12-05 – 2020-12-06 (×2): 10 mL

## 2020-11-28 NOTE — Consult Note (Addendum)
CARDIOLOGY CONSULT NOTE  Patient ID: Jennifer [name] MRN: [id number] DOB/AGE: [birth year] 30 y.o.  Admit date: 11/22/2020 Attending physician: Dwyane Dee, MD Primary Physician:  Sue Lush PA-C Outpatient Cardiologist: Dr. Einar Gip Inpatient Cardiologist: Rex Kras, DO, [facility]  Reason of consultation: History of cardiomyopathy, soft blood pressures Referring physician: Dwyane Dee, MD  Chief complaint: Abdominal pain, diarrhea  HPI:  Jennifer [name] is a 30 y.o. Caucasian female who presents with a chief complaint of " abdominal pain and diarrhea." Her past medical history and cardiovascular risk factors include: History of astrocytoma as a child, hypertension, chronic systolic and diastolic heart failure, diabetes, hypertriglyceridemia (has Lipoprotein lipase deficiency), history of pancreatitis, NAFLD.  Patient presented to the hospital with a chief complaint of abdominal pain for the last 2 weeks getting progressively worse leading to diarrhea.  The pain appeared to be similar to her pancreatitis pain but not as severe.  She underwent CT and there are findings concerning for vasculitis.  Patient was evaluated by vascular surgery no recommendations provided with possible outpatient follow-up with rheumatology.  Cardiology was consulted today as the patient has been experiencing soft blood pressures and has history of heart failure with reduced EF.  Upon further review of the EMR patient's underlying cardiomyopathy is most likely secondary to chemotherapy she received for astrocytoma and had a cardiac MRI back in May 2019 noted findings compatible with myocarditis.    She was last seen by my partner back Dr. Einar Gip in May 2021 and was doing fairly well from a cardiovascular standpoint.   Patient is accompanied by her husband Thedore Mins at bedside.  She denies any chest pain at rest or with effort related activities.  Patient states that she is short of breath with ambulation and at  times conversational.  She denies orthopnea or paroxysmal nocturnal dyspnea.  She has swelling in the lower extremities and chronically her right lower extremity is bigger in size compared to left.  Patient received Solu-Medrol as her concerns for adrenal insufficiency and that helped her blood pressures.  Primary team also has held Entresto and metoprolol succinate over the last 24 hours.  ALLERGIES: Allergies  Allergen Reactions   Ketamine    Clarithromycin Other (See Comments)    Upset stomach    Shellfish Allergy Swelling    Pt states contrast in CT is okay   Penicillins Rash    Has patient had a PCN reaction causing immediate rash, facial/tongue/throat swelling, SOB or lightheadedness with hypotension: Y Has patient had a PCN reaction causing severe rash involving mucus membranes or skin necrosis: Y Has patient had a PCN reaction that required hospitalization: N Has patient had a PCN reaction occurring within the last 10 years: Y If all of the above answers are "NO", then may proceed with Cephalosporin use.    Prednisone Rash    PAST MEDICAL HISTORY: Past Medical History:  Diagnosis Date   Astrocytoma ([place])    Brain tumor ([place]) 03/29/1995   Cholesterosis    Diabetes mellitus without complication ([place])    DM (diabetes mellitus) ([place]) 10/10/2018   Fatty liver    HTN (hypertension) 10/10/2018   Hypertension    Hypothyroidism 10/10/2018   Hypothyroidism    Lipoprotein deficiency    Lung disease    longevity long term   Pancreatitis    Polycystic ovary syndrome    Thyroid disease     PAST SURGICAL HISTORY: Past Surgical History:  Procedure Laterality Date   ABDOMINAL SURGERY     pt states  during miscarriage got her intestine   BRAIN SURGERY     EYE MUSCLE SURGERY Right 03/28/2014   VENTRICULOSTOMY  03/28/1997    FAMILY HISTORY: The patient family history includes Diabetes in her father and mother; Hyperlipidemia in her mother; Hypertension in her father and mother;  Thyroid disease in her mother.   SOCIAL HISTORY:  The patient  reports that she has never smoked. She has never used smokeless tobacco. She reports that she does not drink alcohol and does not use drugs.  MEDICATIONS: Current Outpatient Medications  Medication Instructions   Acetyl L-Carnitine 500 mg, Oral, Daily   albuterol (VENTOLIN HFA) 108 (90 Base) MCG/ACT inhaler 2 puffs, Inhalation, As needed   ALPHA LIPOIC ACID-BIOTIN PO 200 mg, Daily   Coenzyme Q10 (COQ10 PO) 1 tablet, Oral, Daily   CRANBERRY FRUIT PO 1 capsule, Oral, 2 times daily   Cyanocobalamin (VITAMIN B12 PO) 1 Dose, Oral, Daily   cyclobenzaprine (FLEXERIL) 10 mg, Oral, As needed, for muscle spams   dicyclomine (BENTYL) 10 mg, Oral, 3 times daily PRN   DULoxetine (CYMBALTA) 60 mg, Oral, Daily   ENTRESTO 97-103 MG TAKE ONE TABLET BY MOUTH TWICE A DAY   famotidine (PEPCID) 20 mg, Oral, 2 times daily   fenofibrate (TRICOR) 145 mg, Oral, Daily   fluticasone furoate-vilanterol (BREO ELLIPTA) 100-25 MCG/INH AEPB 1 puff, Inhalation, As needed   furosemide (LASIX) 20 mg, Oral, Daily   HYDROcodone-acetaminophen (NORCO/VICODIN) 5-325 MG tablet 1 tablet, Oral, Every 6 hours PRN   ibuprofen (ADVIL) 600 mg, Oral, Every 6 hours PRN   levothyroxine (SYNTHROID) 150 mcg, Oral, Daily   metoprolol (TOPROL-XL) 200 mg, Oral, Daily   metoprolol succinate (TOPROL-XL) 50 mg, Oral, Daily, Take along w/ 212m Daily   metoprolol tartrate (LOPRESSOR) 100 MG tablet TAKE ONE TABLET BY MOUTH TWICE A DAY   nitroGLYCERIN (NITROSTAT) 0.4 mg, Sublingual, Every 5 min PRN   norethindrone-ethinyl estradiol (OVCON-35) 0.4-35 MG-MCG tablet 1 tablet, Oral, Daily   NovoLOG FlexPen 60 Units, Subcutaneous, 3 times daily, Every meal   ondansetron (ZOFRAN) 4 mg, Oral, As needed   pantoprazole (PROTONIX) 40 mg, Oral, Daily   pregabalin (LYRICA) 300 mg, Oral, 2 times daily   prochlorperazine (COMPAZINE) 10 mg, Oral, 2 times daily PRN   rosuvastatin (CRESTOR) 40  mg, Oral, Daily   sacubitril-valsartan (ENTRESTO) 24-26 MG 1 tablet, Oral, 2 times daily   Toujeo SoloStar 94 Units, Subcutaneous, 2 times daily    REVIEW OF SYSTEMS: Review of Systems  Constitutional: Negative for chills and fever.  HENT:  Negative for hoarse voice and nosebleeds.   Eyes:  Negative for discharge, double vision and pain.  Cardiovascular:  Positive for dyspnea on exertion. Negative for chest pain, claudication, leg swelling, near-syncope, orthopnea, palpitations, paroxysmal nocturnal dyspnea and syncope.  Respiratory:  Positive for shortness of breath. Negative for hemoptysis.   Musculoskeletal:  Negative for muscle cramps and myalgias.  Gastrointestinal:  Positive for abdominal pain and diarrhea. Negative for constipation, hematemesis, hematochezia, melena, nausea and vomiting.  Neurological:  Positive for dizziness (with change in position) and light-headedness (with change in position.).  All other systems reviewed and are negative.  PHYSICAL EXAM: Vitals with BMI 11/28/2020 11/28/2020 11/28/2020  Height - - -  Weight - - -  BMI - - -  Systolic - [id number] Diastolic - 73 74  Pulse 80 78 79     Intake/Output Summary (Last 24 hours) at 11/28/2020 1657 Last data filed at 11/28/2020 1500 Gross per 24  hour  Intake 892.46 ml  Output 800 ml  Net 92.46 ml    Net IO Since Admission: 3,195.31 mL [11/28/20 1657]  CONSTITUTIONAL: Appears older than stated age, well-developed and well-nourished. No acute distress.  Hemodynamically stable. SKIN: Skin is warm and dry. No rash noted. No cyanosis. No pallor. No jaundice HEAD: Normocephalic and atraumatic.  EYES: No scleral icterus MOUTH/THROAT: Moist oral membranes.  NECK: JVP present. No thyromegaly noted. No carotid bruits  LYMPHATIC: No visible cervical adenopathy.  CHEST Normal respiratory effort. No intercostal retractions  LUNGS: Clear to auscultation in the upper lung fields with crackles noted at the bases, no stridor. No  wheezes.  CARDIOVASCULAR: Regular, positive I9-C7, soft systolic murmur heard over the left lower sternal border, no gallops or rubs. ABDOMINAL: Soft, distended, nontender, decreased bowel sounds in all 4 quadrants, EXTREMITIES: Right lower extremity more swollen compared to left (chronic per patient), feet are cool to touch but legs are warm, palpable bilateral posterior tibial pulses. HEMATOLOGIC: No significant bruising NEUROLOGIC: Oriented to person, place, and time. Nonfocal. Normal muscle tone.  PSYCHIATRIC: Normal mood and affect. Normal behavior. Cooperative  RADIOLOGY: ECHOCARDIOGRAM COMPLETE  Result Date: 11/28/2020    ECHOCARDIOGRAM REPORT   Patient Name:   Jennifer [name] Date of Exam: 11/28/2020 Medical Rec #:  [id number]         Height:       57.0 in Accession #:    [id number]        Weight:       130.1 lb Date of Birth:  Oct 08, 1990         BSA:          1.498 m Patient Age:    30 years          BP:           112/81 mmHg Patient Gender: F                 HR:           80 bpm. Exam Location:  Inpatient Procedure: 2D Echo, Cardiac Doppler and Color Doppler Indications:    CHF  History:        Patient has prior history of Echocardiogram examinations, most                 recent 04/15/2019.  Sonographer:    Luisa Hart RDCS Referring Phys: [place]  1. Left ventricular ejection fraction, by estimation, is 25 to 30%. The left ventricle has severely decreased function. The left ventricle demonstrates global hypokinesis. Left ventricular diastolic parameters are consistent with Grade II diastolic dysfunction (pseudonormalization). Elevated left atrial pressure. There is the interventricular septum is flattened in diastole ('D' shaped left ventricle), consistent with right ventricular volume overload.  2. Right ventricular systolic function is mildly reduced. The right ventricular size is normal. There is mildly elevated pulmonary artery systolic pressure.  3. The mitral valve is  normal in structure. Mild to moderate mitral valve regurgitation.  4. Tricuspid valve regurgitation is moderate to severe.  5. The aortic valve is normal in structure. Aortic valve regurgitation is mild. FINDINGS  Left Ventricle: Left ventricular ejection fraction, by estimation, is 25 to 30%. The left ventricle has severely decreased function. The left ventricle demonstrates global hypokinesis. The left ventricular internal cavity size was normal in size. There is no left ventricular hypertrophy. The interventricular septum is flattened in diastole ('D' shaped left ventricle), consistent with right ventricular volume overload. Left ventricular diastolic parameters  are consistent with Grade II diastolic dysfunction (pseudonormalization). Elevated left atrial pressure. Right Ventricle: The right ventricular size is normal. No increase in right ventricular wall thickness. Right ventricular systolic function is mildly reduced. There is mildly elevated pulmonary artery systolic pressure. The tricuspid regurgitant velocity  is 2.96 m/s, and with an assumed right atrial pressure of 3 mmHg, the estimated right ventricular systolic pressure is 26.7 mmHg. Left Atrium: Left atrial size was normal in size. Right Atrium: Right atrial size was normal in size. Pericardium: There is no evidence of pericardial effusion. Mitral Valve: The mitral valve is normal in structure. Mild to moderate mitral valve regurgitation, with centrally-directed jet. Tricuspid Valve: The tricuspid valve is normal in structure. Tricuspid valve regurgitation is moderate to severe. Aortic Valve: The aortic valve is normal in structure. Aortic valve regurgitation is mild. Aortic regurgitation PHT measures 367 msec. Aortic valve mean gradient measures 3.0 mmHg. Aortic valve peak gradient measures 4.8 mmHg. Aortic valve area, by VTI measures 1.93 cm. Pulmonic Valve: The pulmonic valve was normal in structure. Pulmonic valve regurgitation is trivial. Aorta:  The aortic root was not well visualized. IAS/Shunts: No atrial level shunt detected by color flow Doppler.  LEFT VENTRICLE PLAX 2D LVIDd:         4.40 cm     Diastology LVIDs:         3.80 cm     LV e' medial:    5.11 cm/s LV PW:         0.90 cm     LV E/e' medial:  17.8 LV IVS:        1.00 cm     LV e' lateral:   9.03 cm/s LVOT diam:     1.80 cm     LV E/e' lateral: 10.1 LV SV:         37 LV SV Index:   25 LVOT Area:     2.54 cm  LV Volumes (MOD) LV vol d, MOD A4C: 70.3 ml LV vol s, MOD A4C: 51.3 ml LV SV MOD A4C:     70.3 ml RIGHT VENTRICLE RV S prime:     9.36 cm/s TAPSE (M-mode): 1.3 cm LEFT ATRIUM             Index       RIGHT ATRIUM           Index LA diam:        3.30 cm 2.20 cm/m  RA Area:     11.20 cm LA Vol (A2C):   27.7 ml 18.49 ml/m RA Volume:   21.70 ml  14.49 ml/m LA Vol (A4C):   22.8 ml 15.22 ml/m LA Biplane Vol: 25.3 ml 16.89 ml/m  AORTIC VALVE                   PULMONIC VALVE AV Area (Vmax):    2.18 cm    PV Vmax:          0.84 m/s AV Area (Vmean):   2.04 cm    PV Vmean:         57.000 cm/s AV Area (VTI):     1.93 cm    PV VTI:           0.140 m AV Vmax:           109.00 cm/s PV Peak grad:     2.8 mmHg AV Vmean:          73.900 cm/s PV Mean grad:  2.0 mmHg AV VTI:            0.191 m     PR End Diast Vel: 2.92 msec AV Peak Grad:      4.8 mmHg AV Mean Grad:      3.0 mmHg LVOT Vmax:         93.50 cm/s LVOT Vmean:        59.100 cm/s LVOT VTI:          0.145 m LVOT/AV VTI ratio: 0.76 AI PHT:            367 msec AR Vena Contracta: 0.20 cm  AORTA Ao Root diam: 2.20 cm Ao Asc diam:  2.50 cm MITRAL VALVE                TRICUSPID VALVE MV Area (PHT): 6.27 cm     TR Peak grad:   35.0 mmHg MV Decel Time: 121 msec     TR Vmax:        296.00 cm/s MR Vena Contracta: 0.30 cm MR PISA:           1.01 cm SHUNTS MR PISA Radius:    0.40 cm  Systemic VTI:  0.14 m MV E velocity: 90.80 cm/s   Systemic Diam: 1.80 cm MV A velocity: 32.30 cm/s MV E/A ratio:  2.81 Mihai Croitoru MD Electronically signed by Sanda Klein MD Signature Date/Time: 11/28/2020/1:16:35 PM    Final     LABORATORY DATA: Lab Results  Component Value Date   WBC 9.8 11/28/2020   HGB 10.2 (L) 11/28/2020   HCT 32.2 (L) 11/28/2020   MCV 89.0 11/28/2020   PLT 119 (L) 11/28/2020    Recent Labs  Lab 11/22/20 1843 11/22/20 2303 11/28/20 1511  NA 132*   < > 129*  K 6.2*   < > 4.8  CL 105   < > 103  CO2 17*   < > 13*  BUN 34*   < > 78*  CREATININE 1.38*   < > 3.73*  CALCIUM 8.5*   < > 7.9*  PROT RESULTS UNAVAILABLE DUE TO INTERFERING SUBSTANCE  --   --   BILITOT 3.0*  --   --   ALKPHOS 84  --   --   ALT 74*  --   --   AST 101*  --   --   GLUCOSE 352*   < > 102*   < > = values in this interval not displayed.    Lipid Panel  Lab Results  Component Value Date   CHOL 504 (H) 11/28/2020   HDL 22 (L) 11/28/2020   LDLCALC NOT CALCULATED 11/28/2020   TRIG 989 (H) 11/28/2020   CHOLHDL 22.9 11/28/2020    BNP (last 3 results) Recent Labs    11/23/20 0224 11/28/20 1511  BNP 48.9 529.6*    HEMOGLOBIN A1C Lab Results  Component Value Date   HGBA1C 7.8 (H) 11/23/2020   MPG 177.16 11/23/2020    Cardiac Panel (last 3 results) No results for input(s): CKTOTAL, CKMB, TROPONINIHS, RELINDX in the last 72 hours.   TSH Recent Labs    11/28/20 1511  TSH 0.098*     CARDIAC DATABASE: EKG: 11/14/2020: Sinus tachycardia, 107 bpm, poor R wave progression, without underlying injury pattern.Compared to prior ECG 10/07/2020 ventricular rate has improved.   Echocardiogram: 11/28/2020:  1. Left ventricular ejection fraction, by estimation, is 25 to 30%. The left ventricle has severely decreased function. The left ventricle demonstrates global hypokinesis.  Left ventricular diastolic parameters are  consistent with Grade II diastolic dysfunction (pseudonormalization). Elevated left atrial pressure. There is the interventricular septum is flattened in diastole ('D' shaped left ventricle), consistent with right ventricular  volume overload.   2. Right ventricular systolic function is mildly reduced. The right ventricular size is normal. There is mildly elevated pulmonary artery systolic pressure.   3. The mitral valve is normal in structure. Mild to moderate mitral valve regurgitation.   4. Tricuspid valve regurgitation is moderate to severe.   5. The aortic valve is normal in structure. Aortic valve regurgitation is  mild.   Stress Testing:  Lexiscan (Walking with mod Bruce) Sestamibi Stress Test 05/08/2019: Nondiagnostic ECG stress. Myocardial perfusion is normal. LV is normal in size both in rest and stress images. Stress LV EF:  calculated at 38% but visually normal. No previous exam available for comparison. Low risk study.   IMPRESSION & RECOMMENDATIONS: Jennifer [name] is a 30 y.o. Caucasian female whose past medical history and cardiovascular risk factors include: History of astrocytoma as a child, hypertension, chronic systolic and diastolic heart failure, diabetes, hypertriglyceridemia (has Lipoprotein lipase deficiency), history of pancreatitis, NAFLD.  Acute on chronic systolic and diastolic heart failure concerning for BiV failure, stage C, NYHA Class III: LVEF based on prior echocardiogram was 40% and now 25-30%. Right ventricular size is dilated with reduced systolic function Positive fluid balance Physical examination findings of JVP, abdominal distention, cool extremities. BNP elevated Acute kidney injury with reduced urine output Elevated LFTs most-likely liver congestion; will repeat CMP Lactic acid within normal limits.  Responded well to Solu-Medrol earlier this morning -primary team concern for adrenal insufficiency. Patient was on ARNI and Toprol XL 241m qday until 11/27/2020. Hold for now.  Will start dobutamine for inotropic support 2.572m/kg/min and norepinephrine for blood pressure support to maintain MAP via central access. Spoke to CCM after rounds -primary team will arrange  transition care to CCM.  Will consult advanced heart failure given her young age, severely reduced LVEF, and concerning for biventricular failure - spoke to Dr. BeHaroldine Lawsho agrees with the plan and [facility]   Nonischemic cardiomyopathy: Management noted above.  Acute kidney injury: Most likely secondary to acute on chronic systolic diastolic heart failure, recent contrast exposure, polypharmacy (Lyrica/Cymbalta/oxycodone/Dilaudid), cardiorenal syndrome. Baseline creatinine between 0.1-1.0 Positive fluid balance. Nephrology following  Hypertriglyceridemia: Known history of lipoprotein lipase deficiency, triglycerides on arrival 4040.  [facility] received insulin/dextrose infusion. Will defer further management to primary/CCM.  Hypothyroidism: On Synthroid. Most recent TSH suggestive of iatrogenic hyperthyroidism.  Diabetes mellitus type 1: Hemoglobin A1c 7.8. Management per primary team.  CRITICAL CARE Performed by: SuRex Kras Total critical care time: 74 minutes   Critical care time was exclusive of separately billable procedures and treating other patients.   Critical care was necessary to treat or prevent imminent or life-threatening deterioration.   Critical care was time spent personally by me on the following activities: development of treatment plan with patient and/or surrogate as well as nursing, discussions with consultants (primary team Dr. GiSabino Gasser[facility]ending, advanced heart failure specialist Dr. BeHaroldine Laws evaluation of patient's response to treatment, examination of patient, obtaining history from patient or surrogate, ordering and performing treatments and interventions, ordering and review of laboratory studies, ordering and review of radiographic studies, pulse oximetry and re-evaluation of patient's condition.  Patient's questions and concerns were  addressed to her  satisfaction. She voices understanding of the instructions provided during this encounter.   This note was created using a voice recognition software as a result there may be grammatical errors inadvertently enclosed that do not reflect the nature of this encounter. Every attempt is made to correct such errors.  Mechele Claude [facility]  Pager: [phone] Office: [phone] 11/28/2020, 4:57 PM

## 2020-11-28 NOTE — Consult Note (Signed)
Referring Provider: No ref. provider found Primary Care Physician:  Sue Lush, PA-C Primary Nephrologist:    Reason for Consultation: Acute kidney injury, maintenance euvolemia, assessment treatment of acid-base disorders, assessment and treatment of electrolyte abnormalities.  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  She also has a previous history of pancreatitis.  She was admitted 11/22/2020.  She presented with abdominal pain with essentially a negative work-up although there was an area of mild circumferential hypodensity along the abdominal aorta and bilateral common iliac arteries on CT scan evaluation.  She does not appear to be oliguric.  She has urine output about 1.1 L 11/27/2020.  Baseline creatinine appears about 0.5.  Her creatinine   gradually increased since admission with an abrupt rise on 11/23/2020 to 11/25/2020.  The rate of rise however appears to be slower over the past 72 hours.  Her urinalysis is bland with no red blood cells no white blood cells  There does appear to be some hemodynamic instability with fairly low blood pressures noted.  Meeting study involved contrast-enhanced CT scan 11/24/2020.  This was positive hepatomegaly and mild dependent atelectasis.  There was no evidence of any acute or chronic mesenteric ischemia.  There was some fibrofatty atherosclerotic plaque resulting in mild stenosis and small penetrating atherosclerotic ulceration of the left common iliac artery.  Home medications: Ibuprofen, insulin, Protonix, Entresto, metoprolol.  Cymbalta, fenofibrate, Lasix, levothyroxine, metoprolol, ovcon-35, Lyrica, Crestor.  In hospital medications: Cymbalta, Pepcid, fenofibrate, levothyroxine, insulin, ovcon, Lyrica, sodium bicarbonate  Metoprolol discontinued 11/27/2020  Sodium 126 potassium 4.7 chloride 98 CO2 16 BUN 67  creatinine 3.75 glucose 135.    Past Medical History:  Diagnosis Date   Astrocytoma [facility])    Brain tumor ([place]) 03/29/1995   Cholesterosis    Diabetes mellitus without complication ([place])    DM (diabetes mellitus) ([place]) 10/10/2018   Fatty liver    HTN (hypertension) 10/10/2018   Hypertension    Hypothyroidism 10/10/2018   Hypothyroidism    Lipoprotein deficiency    Lung disease    longevity long term   Pancreatitis    Polycystic ovary syndrome    Thyroid disease     Past Surgical History:  Procedure Laterality Date   ABDOMINAL SURGERY     pt states during miscarriage got her intestine   BRAIN SURGERY     EYE MUSCLE SURGERY Right 03/28/2014   VENTRICULOSTOMY  03/28/1997    Prior to Admission medications   Medication Sig Start Date End Date Taking? Authorizing Provider  Acetylcarnitine HCl (ACETYL L-CARNITINE) 500 MG CAPS Take 500 mg by mouth daily.   Yes [provider]  albuterol (VENTOLIN HFA) 108 (90 Base) MCG/ACT inhaler Inhale 2 puffs into the lungs as needed for wheezing or shortness of breath.  12/31/18  Yes [provider]  Coenzyme Q10 (COQ10 PO) Take 1 tablet by mouth daily.   Yes [provider]  CRANBERRY FRUIT PO Take 1 capsule by mouth 2 (two) times a day.   Yes [provider]  Cyanocobalamin (VITAMIN B12 PO) Take 1 Dose by mouth daily.   Yes [provider]  cyclobenzaprine (FLEXERIL) 10 MG tablet Take 10 mg by mouth as needed. for muscle spams 11/19/18  Yes [provider]  dicyclomine (BENTYL) 10 MG capsule Take 10 mg by mouth 3 (three) times daily as needed for spasms.  02/11/19  Yes [provider]  DULoxetine (CYMBALTA) 30 MG capsule Take 60 mg by mouth daily.  01/28/19 11/23/20 Yes [provider]  famotidine (PEPCID) 20 MG tablet Take 20 mg by mouth 2 (two) times daily.  02/11/19  Yes [provider]  fenofibrate (TRICOR) 145 MG tablet Take 145 mg by mouth daily. 01/14/19  Yes  [provider]  fluticasone furoate-vilanterol (BREO ELLIPTA) 100-25 MCG/INH AEPB Inhale 1 puff into the lungs as needed (shortness of breath).  01/21/19  Yes [provider]  furosemide (LASIX) 20 MG tablet Take 1 tablet (20 mg total) by mouth daily. 10/07/20  Yes Deno Etienne, DO  ibuprofen (ADVIL) 600 MG tablet Take 1 tablet (600 mg total) by mouth every 6 (six) hours as needed. 10/19/18  Yes Law, Bea Graff, PA-C  Insulin Glargine, 1 Unit Dial, (TOUJEO SOLOSTAR) 300 UNIT/ML SOPN Inject 94 Units into the skin in the morning and at bedtime.   Yes [provider]  levothyroxine (SYNTHROID) 150 MCG tablet Take 150 mcg by mouth daily. 01/14/19  Yes [provider]  metoprolol (TOPROL-XL) 200 MG 24 hr tablet Take 200 mg by mouth daily.   Yes [provider]  metoprolol succinate (TOPROL-XL) 50 MG 24 hr tablet Take 50 mg by mouth daily. Take along w/ 276m Daily   Yes [provider]  norethindrone-ethinyl estradiol (OVCON-35) 0.4-35 MG-MCG tablet Take 1 tablet by mouth daily.   Yes [provider]  NOVOLOG FLEXPEN 100 UNIT/ML FlexPen Inject 60 Units into the skin 3 (three) times daily. Every meal 04/19/19  Yes [provider]  ondansetron (ZOFRAN) 4 MG tablet Take 4 mg by mouth as needed for nausea or vomiting.  12/19/18  Yes [provider]  pantoprazole (PROTONIX) 40 MG tablet Take 40 mg by mouth daily. 12/24/19  Yes [provider]  pregabalin (LYRICA) 300 MG capsule Take 300 mg by mouth 2 (two) times daily.    Yes [provider]  prochlorperazine (COMPAZINE) 10 MG tablet Take 1 tablet (10 mg total) by mouth 2 (two) times daily as needed for nausea or vomiting. 04/18/20  Yes RPattricia Boss MD  rosuvastatin (CRESTOR) 40 MG tablet Take 40 mg by mouth daily.   Yes [provider]  sacubitril-valsartan (ENTRESTO) 24-26 MG Take 1 tablet by mouth 2 (two) times daily.   Yes [provider]  ALPHA  LIPOIC ACID-BIOTIN PO Take 200 mg by mouth daily.  Patient not taking: Reported on 11/23/2020    [provider]  ENTRESTO 97-103 MG TAKE ONE TABLET BY MOUTH TWICE A DAY Patient not taking: Reported on 11/23/2020 08/10/20   GAdrian Prows MD  HYDROcodone-acetaminophen (NORCO/VICODIN) 5-325 MG tablet Take 1 tablet by mouth every 6 (six) hours as needed. Patient not taking: Reported on 11/23/2020 05/10/19   WRipley Fraise MD  metoprolol tartrate (LOPRESSOR) 100 MG tablet TAKE ONE TABLET BY MOUTH TWICE A DAY Patient not taking: Reported on 11/23/2020 08/10/20   GAdrian Prows MD  nitroGLYCERIN (NITROSTAT) 0.4 MG SL tablet Place 1 tablet (0.4 mg total) under the tongue every 5 (five) minutes as needed for up to 25 days for chest pain. 08/01/19 08/26/19  GAdrian Prows MD    Current Facility-Administered Medications  Medication Dose Route Frequency Provider Last Rate Last Admin   0.9 %  sodium chloride infusion  250 mL Intravenous PRN GDwyane Dee MD       acetaminophen (TYLENOL) tablet 650 mg  650 mg Oral Q6H PRN GDwyane Dee MD   [id number]  mg at 11/24/20 3614   Or   acetaminophen (TYLENOL) suppository 650 mg  650 mg Rectal Q6H PRN Dwyane Dee, MD       Chlorhexidine Gluconate Cloth 2 % PADS 6 each  6 each Topical Daily Blount, Lolita Cram, NP   6 each at 11/28/20 0849   dextrose 5 %-0.9 % sodium chloride infusion   Intravenous Continuous Dwyane Dee, MD 50 mL/hr at 11/28/20 0755 New Bag at 11/28/20 0755   dicyclomine (BENTYL) capsule 10 mg  10 mg Oral TID Barbera Setters, MD   10 mg at 11/28/20 4315   diphenhydrAMINE (BENADRYL) capsule 25 mg  25 mg Oral Q6H PRN Dwyane Dee, MD       DULoxetine (CYMBALTA) DR capsule 60 mg  60 mg Oral Daily Dwyane Dee, MD   60 mg at 11/27/20 4008   famotidine (PEPCID) tablet 10 mg  10 mg Oral Daily Dwyane Dee, MD   10 mg at 11/27/20 0827   fenofibrate tablet 160 mg  160 mg Oral Daily Dwyane Dee, MD   160 mg at 11/27/20 6761   heparin injection 5,000  Units  5,000 Units Subcutaneous Lenise Arena, MD   5,000 Units at 11/28/20 0503   HYDROmorphone (DILAUDID) injection 0.5 mg  0.5 mg Intravenous Q3H PRN Dwyane Dee, MD   0.5 mg at 11/28/20 0509   insulin (MYXREDLIN) 100 units/100 mL infusion for hypertriglyceridemia-induced pancreatitis  0.1 Units/kg/hr Intravenous Continuous Dwyane Dee, MD 5.58 mL/hr at 11/28/20 0553 0.1 Units/kg/hr at 11/28/20 0553   levothyroxine (SYNTHROID) tablet 150 mcg  150 mcg Oral Q0600 Dwyane Dee, MD   150 mcg at 11/28/20 0503   lidocaine (XYLOCAINE) 2 % viscous mouth solution 15 mL  15 mL Oral QID Dwyane Dee, MD   15 mL at 11/27/20 2230   norethindrone-ethinyl estradiol (OVCON-35) tablet 1 tablet  1 tablet Oral Daily Dwyane Dee, MD   1 tablet at 11/23/20 0920   ondansetron (ZOFRAN) 8 mg in sodium chloride 0.9 % 50 mL IVPB  8 mg Intravenous Q6H PRN Dwyane Dee, MD 216 mL/hr at 11/27/20 1110 8 mg at 11/27/20 1110   oxyCODONE (Oxy IR/ROXICODONE) immediate release tablet 5 mg  5 mg Oral Q4H PRN Dwyane Dee, MD   5 mg at 11/27/20 2230   phenol (CHLORASEPTIC) mouth spray 1 spray  1 spray Mouth/Throat PRN Dwyane Dee, MD   1 spray at 11/26/20 0509   pregabalin (LYRICA) capsule 75 mg  75 mg Oral BID Dwyane Dee, MD   75 mg at 11/27/20 2230   rosuvastatin (CRESTOR) tablet 40 mg  40 mg Oral Daily Dwyane Dee, MD   40 mg at 11/27/20 0827   senna-docusate (Senokot-S) tablet 1 tablet  1 tablet Oral QHS PRN Dwyane Dee, MD       sodium bicarbonate tablet 650 mg  650 mg Oral TID Dwyane Dee, MD   650 mg at 11/28/20 0755   sodium chloride flush (NS) 0.9 % injection 3 mL  3 mL Intravenous Eddie Candle, MD   3 mL at 11/27/20 2150   sodium chloride flush (NS) 0.9 % injection 3 mL  3 mL Intravenous PRN Dwyane Dee, MD        Allergies as of 11/22/2020 - Review Complete 11/22/2020  Allergen Reaction Noted   Ketamine  11/22/2020   Clarithromycin Other (See Comments) 07/29/2018    Shellfish allergy Swelling 11/25/2017   Penicillins Rash 11/25/2017   Prednisone Rash 03/26/2014    Family History  Problem Relation Age of Onset   Diabetes Mother    Hypertension Mother    Hyperlipidemia Mother    Thyroid disease Mother    Hypertension Father    Diabetes Father     Social History   Socioeconomic History   Marital status: Married    Spouse name: Not on file   Number of children: 0   Years of education: Not on file   Highest education level: Not on file  Occupational History   Not on file  Tobacco Use   Smoking status: Never   Smokeless tobacco: Never  Vaping Use   Vaping Use: Never used  Substance and Sexual Activity   Alcohol use: Never   Drug use: Never   Sexual activity: Not on file  Other Topics Concern   Not on file  Social History Narrative   Not on file   Social Determinants of Health   Financial Resource Strain: Not on file  Food Insecurity: Not on file  Transportation Needs: Not on file  Physical Activity: Not on file  Stress: Not on file  Social Connections: Not on file  Intimate Partner Violence: Not on file    Review of Systems: Gen: Denies any fever, chills, sweats, anorexia, fatigue, weakness, malaise, weight loss, and sleep disorder HEENT: No visual complaints, No history of Retinopathy. Normal external appearance No Epistaxis or Sore throat. No sinusitis.   CV: Denies chest pain, angina, palpitations, syncope, orthopnea, PND, peripheral edema, and claudication. Resp: Denies dyspnea at rest, dyspnea with exercise, cough, sputum, wheezing, coughing up blood, and pleurisy. GI: Denies vomiting blood, jaundice, and fecal incontinence.   Denies dysphagia or odynophagia. GU : History of pancreatitis. MS: Denies joint pain, limitation of movement, and swelling, stiffness, low back pain, extremity pain. Denies muscle weakness, cramps, atrophy.  No use of non steroidal antiinflammatory drugs. Derm: Denies rash, itching, dry skin, hives,  moles, warts, or unhealing ulcers.  Psych: Denies depression, anxiety, memory loss, suicidal ideation, hallucinations, paranoia, and confusion. Heme: Denies bruising, bleeding, and enlarged lymph nodes. Neuro: History of astrocytoma as a child Endocrine history of hypothyroidism and diabetes  Physical Exam: Vital signs in last 24 hours: Temp:  [97.8 F (36.6 C)-99.2 F (37.3 C)] 98.1 F (36.7 C) (09/03 0859) Pulse Rate:  [70-99] 85 (09/03 0900) Resp:  [20-28] 21 (09/03 0900) BP: (75-127)/(47-94) 99/69 (09/03 0900) SpO2:  [93 %-100 %] 96 % (09/03 0900) Weight:  [59 kg] 59 kg (09/02 2150) Last BM Date: 11/27/20 General:   Alert,  Well-developed, well-nourished, pleasant and cooperative in NAD Head:  Normocephalic and atraumatic. Eyes:  Sclera clear, no icterus.   Conjunctiva pink. Ears:  Normal auditory acuity. Nose:  No deformity, discharge,  or lesions. Mouth:  No deformity or lesions, dentition normal. Neck:  Supple; no masses or thyromegaly. JVP not elevated Lungs:  Clear throughout to auscultation.   No wheezes, crackles, or rhonchi. No acute distress. Heart:  Regular rate and rhythm; no murmurs, clicks, rubs,  or gallops. Abdomen:  Soft, nontender and nondistended. No masses, hepatosplenomegaly or hernias noted. Normal bowel sounds, without guarding, and without rebound.   Msk:  Symmetrical without gross deformities. Normal posture. Pulses:  No carotid, renal, femoral bruits. DP and PT symmetrical and equal Extremities:  Without clubbing or edema. Neurologic:  Alert and  oriented x4;  grossly normal neurologically. Skin:  Intact without significant lesions or rashes. Cervical Nodes:  No significant cervical adenopathy. Psych:  Alert and cooperative. Normal mood and affect.  Intake/Output  from previous day: 09/02 0701 - 09/03 0700 In: 481 [I.V.:427; IV Piggyback:54] Out: 1100 [Urine:1100] Intake/Output this shift: No intake/output data recorded.  Lab Results: Recent  Labs    11/26/20 0408 11/27/20 0716 11/28/20 0218  WBC 9.2 10.7* 9.8  HGB 11.5* 10.9* 10.2*  HCT 35.7* 33.4* 32.2*  PLT 191 156 119*   BMET Recent Labs    11/27/20 0716 11/28/20 0015 11/28/20 0218  NA 129* 126* 126*  K 5.4* 5.5* 4.7  CL 103 99 98  CO2 14* 14* 16*  GLUCOSE 219* 218* 135*  BUN 63* 65* 67*  CREATININE 3.20* 3.67* 3.75*  CALCIUM 8.6* 8.1* 8.1*   LFT No results for input(s): PROT, ALBUMIN, AST, ALT, ALKPHOS, BILITOT, BILIDIR, IBILI in the last 72 hours. PT/INR No results for input(s): LABPROT, INR in the last 72 hours. Hepatitis Panel No results for input(s): HEPBSAG, HCVAB, HEPAIGM, HEPBIGM in the last 72 hours.  Studies/Results: No results found.  Assessment/Plan: Acute kidney injury: Outpatient medications included Entresto , ibuprofen and Lasix.  CT scan did not reveal any evidence of hydronephrosis urinalysis is bland.  Blood pressures are low.  Patient received IV contrast.  All this could have contributed to ischemic ATN.  Continue to avoid nephrotoxins.  Renally adjust medications.  Avoid ACE inhibitor's ARB's nonsteroidal anti-inflammatory drugs.  Check renal panel on a daily basis.  Close follow-up of I's and O's ANEMIA-does not appear to be an issue. MBD-continue to follow. HTN/VOL-maintains hypotension.  I would consider adrenal insufficiency is being an etiology.  Would also recommend 2D echo to evaluate cardiac function.  The patient does not appear to be septic. Hypothyroidism.  Replacement therapy. Diabetes mellitus controlled. Polycystic ovarian syndrome on birth control. CT angio revealing plaque in the internal iliac artery. Hyperlipidemia followed [facility]   LOS: [place] @TODAY @9 :08 AM

## 2020-11-28 NOTE — Progress Notes (Addendum)
eLink Physician-Brief Progress Note Patient Name: Jennifer [name] DOB: 05-29-1990 MRN: [id number]   Date of Service  11/28/2020  HPI/Events of Note  30 year old woman now in ICU , transferred from [facility] ICU with decompensated new heart failure with EF in 20s, AKI, transaminitis. On nasal o2. Cardiology consulted at [facility] hospital planned for Inotrope and pressor support.   eICU Interventions  PCCM notified and will evaluate No distress noted on camera Call E link if needed     Intervention Category Major Interventions: Acute renal failure - evaluation and management Evaluation Type: New Patient Evaluation  Margaretmary Lombard 11/28/2020, 9:57 PM  1:40 am  Notified that patient is on D5NS at 50 cc/hour, SBP in 140s/150s and glucose rising The D5 was added to supplement the low fixed dose insulin that is infusing She has CHF and plan is to diurese her at some point if she remains stable We are going to turn off the fluids, continue q1 checks and resume the D5 if glucose drops less than 130  No indication to treat BP at this time, she was initially hypotensive and is now improving and has no symptoms at all D/w RN

## 2020-11-28 NOTE — Progress Notes (Addendum)
Progress Note    Jennifer [name]   YQI:[id number]  DOB: 04/02/1990  DOA: 11/22/2020     5  PCP: Sue Lush, PA-C  Initial CC: abd pain  Hospital Course: Jennifer [name] is a 30 yo female with PMH astrocytoma, DMII, NAFLD, LPL deficiency, HTN, hypothyroidism, PCOS, sCHF, chronic pancreatitis who presented with abdominal pain.  Her pain had been present for approximately 2 weeks prior to admission with progressive worsening.  There was also associated diarrhea but no nausea/vomiting.  She had similar pain in the past during prior pancreatitis episodes.  She has been compliant with Creon at home. She also follows outpatient with GI and has had a recent EGD. She also follows annually with neurosurgery for her history of astrocytoma as a child which required chemo and may have contributed to her underlying CHF.  She also follows with endocrinology and cardiology for her diabetes and CHF respectively.  She underwent work-up in the ER.  Lipase was normal and CT abdomen/pelvis showed no signs of pancreatitis. There was an area of "mild circumferential hypodensity along abdominal aorta and bilateral common iliac arteries" noted on CT abdomen/pelvis. She was evaluated by vascular surgery after admission as well and given imaging findings, there was no indication for intervention.  Interval History:  Sitting up in recliner this morning.  [name] lethargic intermittently but always able to carry on conversation easily and answer questions appropriately. She did endorse this morning that her urine output felt like it was decreasing some.  Reviewed her labs with her and current plan of care.  Also called her mom this morning and updated her once again especially after the transfer down to stepdown unit yesterday.  ROS: Constitutional: negative for chills and fevers, Respiratory: negative for cough, Cardiovascular: negative for chest pain, and Gastrointestinal: positive for abdominal  pain  Assessment & Plan: * Abdominal pain - unclear etiology currently. Differential on admission included vasculitis however CT findings appeared stable with comparison of prior; other considered differential is mesenteric ischemia given some association with meals but also with her underlying risk factors for vascular disease (also ruled out with CTA A/P on 8/30 and discussed findings with vascular as well) - continue soft diet - continue pain control - at this point, have ruled out enough serious etiologies; patient states has also had extensive outpt workup for this as well with no clear etiology - continue bentyl, tylenol, cymbalta, pepcid, lyrica - trial of GI cocktail in case related to GERD (does localize the worst spot in her epigastrium)  - of note has undergone gastric emptying study on 08/25/20 (53% at 4 hours, consistent with delayed gastric emptying) - may also be related to HyperTG though no pancreatitis on imaging and normal lipse (possibly burnt out pancreas? But not atrophy noted on CT either)  Hypotension - BP has been further downtrending past few days; unclear etiology; no fever or leukocytosis and does not appear septic/infected  - differential includes adrenal insufficiency vs early cardiogenic shock vs volume depletion from poor intake vs polypharmacy (d/c cymbalta, lyrica, bentyl, oxycodone, and lower dilaudid dose) - checking cortisol; test dose of solu-medrol to see if any benefit as well  - hold off on fluid challenge for now  AKI (acute kidney injury) ([place]) - baseline creatinine ~ 0.8 - 1 - patient presents with increase in creat >0.3 mg/dL above baseline, creat increase >1.5x baseline presumed to have occurred within past 7 days PTA -Creatinine has been worsening in the past 1 to 2 days.  [name] also developed difficulty with voiding and required straight cath on 9/1 - she has since been able to void well; can stop bladder scans - creatinine continues to rise but  rate of change today less than prior, hoping this is peaking and stabilizing (possibly ATN from BOO) - she did not tolerate IVF as they caused SOB also - hold IVF and hold Entresto and lasix - BMP daily  - still worsening function on 9/3; output 1100 cc last 24 hours - nephrology consulted for assistance - starting oral bicarb tabs due to acidosis   Chronic systolic CHF (congestive heart failure) ([place]) - last echo: 03/2019: EF 40%, Gr 1 DD, Grade 3 MR, moderate TR. Was stable compared to 09/2018 echo - no s/s exacerbation - continue home lasix and Entresto (on hold for now due to hypotension and dizziness as well as AKI) - repeating echo at this time - low threshold for cardiology consult   Hypertriglyceridemia - Continue fenofibrate and Crestor - TG 4040, CHL 473; lipase negative x 3 and no pancreatitis noted on CT A/P - patient has history of LPL deficiency and follows with [facility]; also discussed case with Dr. Garrel Ridgel at [facility] on 9/2 - transferred patient to SDU and started insulin/dextrose infusions for HyperTG (if unsuccessful would need to consider tx for plasmaphresis) - cycle lipid panel q12h  Prolonged QT interval - QTC 494  Essential hypertension - Continue Toprol  Hypothyroidism - Continue Synthroid  DM (diabetes mellitus) ([place]) - A1c 7.8% on 11/23/20 - continue SSI and CBG monitoring    Old records reviewed in assessment of this patient  Antimicrobials:   DVT prophylaxis: heparin injection 5,000 Units Start: 11/27/20 1100   Code Status:   Code Status: Full Code Family Communication: mom  Disposition Plan: Status is: Inpatient  Remains inpatient appropriate because:Ongoing active pain requiring inpatient pain management, IV treatments appropriate due to intensity of illness or inability to take PO, and Inpatient level of care appropriate due to severity of illness  Dispo: The patient is from: Home              Anticipated d/c is to: Home               Patient currently is not medically stable to d/c.   Difficult to place patient No  Risk of unplanned readmission score: Unplanned Admission- Pilot do not use: 18.51   Objective: Blood pressure 112/81, pulse (!) 39, temperature 97.9 F (36.6 C), temperature source Axillary, resp. rate 20, height 4' 9"  (1.448 m), weight 59 kg, last menstrual period 11/19/2020, SpO2 95 %.  Examination: General appearance: alert, cooperative, and no distress Head: Normocephalic, without obvious abnormality, atraumatic Eyes:  EOMI Lungs: clear to auscultation bilaterally Heart: regular rate and rhythm and S1, S2 normal Abdomen:  Mildly distended and swollen but no obvious tenderness to palpation.  Bowel sounds present. Extremities:  No edema Skin: mobility and turgor normal Neurologic: Grossly normal  Consultants:  Vascular surgery Nephrology Cardiology    Procedures:    Data Reviewed: I have personally reviewed following labs and imaging studies Results for orders placed or performed during the hospital encounter of 11/22/20 (from the past 24 hour(s))  Glucose, capillary     Status: Abnormal   Collection Time: 11/27/20  5:18 PM  Result Value Ref Range   Glucose-Capillary 197 (H) 70 - 99 mg/dL  Glucose, capillary     Status: Abnormal   Collection Time: 11/27/20  9:46 PM  Result Value  Ref Range   Glucose-Capillary 212 (H) 70 - 99 mg/dL  MRSA Next Gen by PCR, Nasal     Status: None   Collection Time: 11/27/20 10:07 PM   Specimen: Nasal Mucosa; Nasal Swab  Result Value Ref Range   MRSA by PCR Next Gen NOT DETECTED NOT DETECTED  Lipid panel     Status: Abnormal   Collection Time: 11/27/20 10:34 PM  Result Value Ref Range   Cholesterol 497 (H) 0 - 200 mg/dL   Triglycerides 1,215 (H) <150 mg/dL   HDL NOT REPORTED DUE TO HIGH TRIGLYCERIDES >40 mg/dL   Total CHOL/HDL Ratio NOT REPORTED DUE TO HIGH TRIGLYCERIDES RATIO   VLDL UNABLE TO CALCULATE IF TRIGLYCERIDE OVER 400 mg/dL 0 - 40 mg/dL    LDL Cholesterol NOT CALCULATED 0 - 99 mg/dL  Glucose, capillary     Status: Abnormal   Collection Time: 11/27/20 11:00 PM  Result Value Ref Range   Glucose-Capillary 199 (H) 70 - 99 mg/dL  Glucose, capillary     Status: Abnormal   Collection Time: 11/27/20 11:56 PM  Result Value Ref Range   Glucose-Capillary 186 (H) 70 - 99 mg/dL  Basic metabolic panel     Status: Abnormal   Collection Time: 11/28/20 12:15 AM  Result Value Ref Range   Sodium 126 (L) 135 - 145 mmol/L   Potassium 5.5 (H) 3.5 - 5.1 mmol/L   Chloride 99 98 - 111 mmol/L   CO2 14 (L) 22 - 32 mmol/L   Glucose, Bld 218 (H) 70 - 99 mg/dL   BUN 65 (H) 6 - 20 mg/dL   Creatinine, Ser 3.67 (H) 0.44 - 1.00 mg/dL   Calcium 8.1 (L) 8.9 - 10.3 mg/dL   GFR, Estimated 16 (L) >60 mL/min   Anion gap 13 5 - 15  Glucose, capillary     Status: Abnormal   Collection Time: 11/28/20  1:12 AM  Result Value Ref Range   Glucose-Capillary 157 (H) 70 - 99 mg/dL   Comment 1 Notify RN    Comment 2 Document in Chart   Glucose, capillary     Status: Abnormal   Collection Time: 11/28/20  1:58 AM  Result Value Ref Range   Glucose-Capillary 146 (H) 70 - 99 mg/dL  Basic metabolic panel     Status: Abnormal   Collection Time: 11/28/20  2:18 AM  Result Value Ref Range   Sodium 126 (L) 135 - 145 mmol/L   Potassium 4.7 3.5 - 5.1 mmol/L   Chloride 98 98 - 111 mmol/L   CO2 16 (L) 22 - 32 mmol/L   Glucose, Bld 135 (H) 70 - 99 mg/dL   BUN 67 (H) 6 - 20 mg/dL   Creatinine, Ser 3.75 (H) 0.44 - 1.00 mg/dL   Calcium 8.1 (L) 8.9 - 10.3 mg/dL   GFR, Estimated 16 (L) >60 mL/min   Anion gap 12 5 - 15  CBC with Differential/Platelet     Status: Abnormal   Collection Time: 11/28/20  2:18 AM  Result Value Ref Range   WBC 9.8 4.0 - 10.5 K/uL   RBC 3.62 (L) 3.87 - 5.11 MIL/uL   Hemoglobin 10.2 (L) 12.0 - 15.0 g/dL   HCT 32.2 (L) 36.0 - 46.0 %   MCV 89.0 80.0 - 100.0 fL   MCH 28.2 26.0 - 34.0 pg   MCHC 31.7 30.0 - 36.0 g/dL   RDW 16.4 (H) 11.5 - 15.5 %    Platelets 119 (L) 150 - 400 K/uL  nRBC 0.3 (H) 0.0 - 0.2 %   Neutrophils Relative % 69 %   Neutro Abs 6.7 1.7 - 7.7 K/uL   Lymphocytes Relative 21 %   Lymphs Abs 2.1 0.7 - 4.0 K/uL   Monocytes Relative 9 %   Monocytes Absolute 0.9 0.1 - 1.0 K/uL   Eosinophils Relative 0 %   Eosinophils Absolute 0.0 0.0 - 0.5 K/uL   Basophils Relative 0 %   Basophils Absolute 0.0 0.0 - 0.1 K/uL   Immature Granulocytes 1 %   Abs Immature Granulocytes 0.12 (H) 0.00 - 0.07 K/uL  Magnesium     Status: Abnormal   Collection Time: 11/28/20  2:18 AM  Result Value Ref Range   Magnesium 2.6 (H) 1.7 - 2.4 mg/dL  Glucose, capillary     Status: Abnormal   Collection Time: 11/28/20  3:03 AM  Result Value Ref Range   Glucose-Capillary 136 (H) 70 - 99 mg/dL  Glucose, capillary     Status: Abnormal   Collection Time: 11/28/20  3:58 AM  Result Value Ref Range   Glucose-Capillary 127 (H) 70 - 99 mg/dL  Glucose, capillary     Status: Abnormal   Collection Time: 11/28/20  5:02 AM  Result Value Ref Range   Glucose-Capillary 102 (H) 70 - 99 mg/dL  Glucose, capillary     Status: None   Collection Time: 11/28/20  5:59 AM  Result Value Ref Range   Glucose-Capillary 94 70 - 99 mg/dL  Glucose, capillary     Status: None   Collection Time: 11/28/20  6:56 AM  Result Value Ref Range   Glucose-Capillary 94 70 - 99 mg/dL  Glucose, capillary     Status: None   Collection Time: 11/28/20  7:58 AM  Result Value Ref Range   Glucose-Capillary 76 70 - 99 mg/dL  Glucose, capillary     Status: None   Collection Time: 11/28/20  9:29 AM  Result Value Ref Range   Glucose-Capillary 70 70 - 99 mg/dL  Glucose, capillary     Status: None   Collection Time: 11/28/20 11:04 AM  Result Value Ref Range   Glucose-Capillary 85 70 - 99 mg/dL  Basic metabolic panel     Status: Abnormal   Collection Time: 11/28/20 11:29 AM  Result Value Ref Range   Sodium 126 (L) 135 - 145 mmol/L   Potassium 4.6 3.5 - 5.1 mmol/L   Chloride 99 98 - 111  mmol/L   CO2 17 (L) 22 - 32 mmol/L   Glucose, Bld 71 70 - 99 mg/dL   BUN 73 (H) 6 - 20 mg/dL   Creatinine, Ser 3.91 (H) 0.44 - 1.00 mg/dL   Calcium 7.8 (L) 8.9 - 10.3 mg/dL   GFR, Estimated 15 (L) >60 mL/min   Anion gap 10 5 - 15  Glucose, capillary     Status: None   Collection Time: 11/28/20 11:57 AM  Result Value Ref Range   Glucose-Capillary 84 70 - 99 mg/dL    Recent Results (from the past 240 hour(s))  Resp Panel by RT-PCR (Flu A&B, Covid) Nasopharyngeal Swab     Status: None   Collection Time: 11/23/20  1:25 AM   Specimen: Nasopharyngeal Swab; Nasopharyngeal(NP) swabs in vial transport medium  Result Value Ref Range Status   SARS Coronavirus 2 by RT PCR NEGATIVE NEGATIVE Final    Comment: (NOTE) SARS-CoV-2 target nucleic acids are NOT DETECTED.  The SARS-CoV-2 RNA is generally detectable in upper respiratory specimens during the acute phase  of infection. The lowest concentration of SARS-CoV-2 viral copies this assay can detect is 138 copies/mL. A negative result does not preclude SARS-Cov-2 infection and should not be used as the sole basis for treatment or other patient management decisions. A negative result may occur with  improper specimen collection/handling, submission of specimen other than nasopharyngeal swab, presence of viral mutation(s) within the areas targeted by this assay, and inadequate number of viral copies(<138 copies/mL). A negative result must be combined with clinical observations, patient history, and epidemiological information. The expected result is Negative.  Fact Sheet for Patients:  EntrepreneurPulse.com.au  Fact Sheet for Healthcare Providers:  IncredibleEmployment.be  This test is no t yet approved or cleared by the [place] FDA and  has been authorized for detection and/or diagnosis of SARS-CoV-2 by FDA under an Emergency Use Authorization (EUA). This EUA will remain  in effect (meaning this  test can be used) for the duration of the COVID-19 declaration under Section 564(b)(1) of the Act, 21 U.S.C.section 360bbb-3(b)(1), unless the authorization is terminated  or revoked sooner.       Influenza A by PCR NEGATIVE NEGATIVE Final   Influenza B by PCR NEGATIVE NEGATIVE Final    Comment: (NOTE) The Xpert Xpress SARS-CoV-2/FLU/RSV plus assay is intended as an aid in the diagnosis of influenza from Nasopharyngeal swab specimens and should not be used as a sole basis for treatment. Nasal washings and aspirates are unacceptable for Xpert Xpress SARS-CoV-2/FLU/RSV testing.  Fact Sheet for Patients: EntrepreneurPulse.com.au  Fact Sheet for Healthcare Providers: IncredibleEmployment.be  This test is not yet approved or cleared by the [place] FDA and has been authorized for detection and/or diagnosis of SARS-CoV-2 by FDA under an Emergency Use Authorization (EUA). This EUA will remain in effect (meaning this test can be used) for the duration of the COVID-19 declaration under Section 564(b)(1) of the Act, 21 U.S.C. section 360bbb-3(b)(1), unless the authorization is terminated or revoked.  Performed at [facility], [place] [address], [place], [place] [address]   Gastrointestinal Panel by PCR , Stool     Status: Abnormal   Collection Time: 11/24/20  3:50 PM   Specimen: Rectum; Stool  Result Value Ref Range Status   Campylobacter species NOT DETECTED NOT DETECTED Final   Plesimonas shigelloides NOT DETECTED NOT DETECTED Final   Salmonella species NOT DETECTED NOT DETECTED Final   Yersinia enterocolitica NOT DETECTED NOT DETECTED Final   Vibrio species NOT DETECTED NOT DETECTED Final   Vibrio cholerae NOT DETECTED NOT DETECTED Final   Enteroaggregative E coli (EAEC) NOT DETECTED NOT DETECTED Final   Enteropathogenic E coli (EPEC) DETECTED (A) NOT DETECTED Final    Comment: RESULT CALLED TO, READ BACK BY AND VERIFIED  WITH: MEGAN BULLINS 11/26/20 1349 KLW    Enterotoxigenic E coli (ETEC) NOT DETECTED NOT DETECTED Final   Shiga like toxin producing E coli (STEC) NOT DETECTED NOT DETECTED Final   Shigella/Enteroinvasive E coli (EIEC) NOT DETECTED NOT DETECTED Final   Cryptosporidium NOT DETECTED NOT DETECTED Final   Cyclospora cayetanensis NOT DETECTED NOT DETECTED Final   Entamoeba histolytica NOT DETECTED NOT DETECTED Final   Giardia lamblia NOT DETECTED NOT DETECTED Final   Adenovirus F40/41 NOT DETECTED NOT DETECTED Final   Astrovirus NOT DETECTED NOT DETECTED Final   Norovirus GI/GII NOT DETECTED NOT DETECTED Final   Rotavirus A NOT DETECTED NOT DETECTED Final   Sapovirus (I, II, IV, and V) NOT DETECTED NOT DETECTED Final    Comment: Performed at  [facility], [address], [place], [place] [address]  MRSA Next Gen by PCR, Nasal     Status: None   Collection Time: 11/27/20 10:07 PM   Specimen: Nasal Mucosa; Nasal Swab  Result Value Ref Range Status   MRSA by PCR Next Gen NOT DETECTED NOT DETECTED Final    Comment: (NOTE) The GeneXpert MRSA Assay (FDA approved for NASAL specimens only), is one component of a comprehensive MRSA colonization surveillance program. It is not intended to diagnose MRSA infection nor to guide or monitor treatment for MRSA infections. Test performance is not FDA approved in patients less than 84 years old. Performed at [facility], [place] [address], [place], [place] [address]      Radiology Studies: No results found. CT Angio Abd/Pel w/ and/or w/o  Final Result    DG Chest 1 View  Final Result    CT ABDOMEN PELVIS W CONTRAST  Final Result      Scheduled Meds:  Chlorhexidine Gluconate Cloth  6 each Topical Daily   DULoxetine  60 mg Oral Daily   famotidine  10 mg Oral Daily   fenofibrate  160 mg Oral Daily   heparin injection (subcutaneous)  5,000 Units Subcutaneous Q8H   levothyroxine  150 mcg Oral Q0600   lidocaine  15 mL  Oral QID   norethindrone-ethinyl estradiol  1 tablet Oral Daily   rosuvastatin  40 mg Oral Daily   sodium bicarbonate  650 mg Oral TID   sodium chloride flush  3 mL Intravenous Q12H   PRN Meds: sodium chloride, acetaminophen **OR** acetaminophen, diphenhydrAMINE, HYDROmorphone (DILAUDID) injection, ondansetron (ZOFRAN) IV, phenol, senna-docusate, sodium chloride flush Continuous Infusions:  sodium chloride     dextrose 5 % and 0.9% NaCl 50 mL/hr at 11/28/20 0755   insulin 0.1 Units/kg/hr (11/28/20 0553)   ondansetron (ZOFRAN) IV 8 mg (11/27/20 1110)     LOS: 5 days  Time spent: Greater than 50% of the 35 minute visit was spent in counseling/coordination of care for the patient as laid out in the A&P.   Dwyane Dee, MD [facility] 11/28/2020, 12:53 PM

## 2020-11-28 NOTE — Consult Note (Addendum)
NAME:  Jennifer [name], MRN:  [id number], DOB:  [birth year], LOS: 5 ADMISSION DATE:  11/22/2020, CONSULTATION DATE:  9/3 REFERRING MD:  Sabino Gasser MD, Reason for consult:  decompensated heart failure   History of Present Illness:  Jennifer [name] is a  30 y.o. F who is seen in consultation at the request of Dr. Sabino Gasser for recommendations on further evaluation and management of decompensated heart failure. The patient presented to [facility] ED  on 8/28 via with complaints of abdominal pain and diarrhea. The patient was admitted to the [facility] hospitalist service.  She presented to the ED after two weeks of worsening abdominal pain and diarrhea. Her workup in the [facility] ED did not demonstrate pancreatitis on CT abdomen. There was some concern for vasculitis for which she was seen by vascular surgery with recommendations for outpatient follow up.   Her TG were found to be  4030 on 8/31. She has a history of LPL deficiency and follows with [facility]ology with Dr. Garrel Ridgel. She was started on an insulin/dextrose infusion on 9/3. TG [phone]. Lipase was remained WNL  Cardiology and Nephrology were consulted on 9/3 after her creatinine continued to rise and she began developing hypotension. EF was found to be 25-30% on ECHO with elevate LAP and RV volume overload. Cardiology has recommended the patient be transferred to [facility] to be started on NE and dobutamine.   Pertinent  Medical History  Astrocytoma, DMII, NAFLD, LPL deficiency, HTN, hypothyroidis, PCOS, CHF, chronic pancreatitis  Significant Hospital Events: Including procedures, antibiotic start and stop dates in addition to other pertinent events   8/28 Admit, CT abd pelvis no pancreatitis, concern for vasculitis. Seen by VS. Needs outpatient follow up. 8/30 CTA abd pelvis: no evidence of mesenteric ischemia. Un remarkable pancreas 9/2 Cards and Renal consulted. ECHO. LVEF 25-30%. LV severely decreased function. Grade II diastolic dysfunction. RVSF mild  reduced. Moderate to severe TR. TXR requested to [facility].   Interim History / Subjective:  See above  Subjective: Denies chest pain, endorses shortness of breath, 7/10 abdominal pain, 4 BM today with diarrhea, denies blood.  Objective   Blood pressure 133/84, pulse 85, temperature 98.2 F (36.8 C), temperature source Oral, resp. rate (!) 23, height 4' 9"  (1.448 m), weight 59 kg, last menstrual period 11/19/2020, SpO2 98 %. On RA        Intake/Output Summary (Last 24 hours) at 11/28/2020 2252 Last data filed at 11/28/2020 2213 Gross per 24 hour  Intake 1265.19 ml  Output 800 ml  Net 465.19 ml   Filed Weights   11/23/20 0355 11/27/20 2150  Weight: 55.8 kg 59 kg    Examination: General: in bed, NAD, appears comfortable HEENT: MM pink/moist, anicteric, atraumatic Neuro: GCS 15, RASS 0, MAE CV: S1S2, NSR, no m/r/g appreciated PULM:  clear in the upper lobe and in the lower lobes, Trachea midline, chest expansion symmetric GI: rounded, bsx4 active, generalized tenderness  Extremities: warm/dry, no pretibial edema, capillary refill less than 3 seconds  Skin: no rashes or lesions   Resolved Hospital Problem list     Assessment & Plan:  Decompensated acute on chronic systolic and diastolic heart failure Nonischemic cardiomyopathy Hx prolonged QTC 9/3 ECHO. LVEF 25-30%. LV severely decreased function. Grade II diastolic dysfunction. RVSF mild reduced. Moderate to severe TR. BNP 529. Lactate 1.3 on 9/3. 2.9 on admission. WBC 9.8. Does not appear to be septic. Cardiology believed underlying cardiomyopathy secondary to chemotherapy received for astrocytoma. -Admit to ICU.  -Cardiology and HF consulted. Will  follow for recommendations. -Initiating dobutamine 2.64mg/kg for inotropy -Goal map 65 or greater. Titrate NE to goal if needed. -GDMT per cards/HF. GDMT held currently due to hypotension -Received x1 dose of stress dose steroids. Now normotensive. AM cortisol 41.5. Renal concerned  for adrenal insufficiency.  -Holding on further diuresis until CVC is placed -Trend COOX and CVP -Monitor QTC on bedside telemetry. Limit qtc prolonging drugs as able. Addendum -Dr. GDuwayne Heckspoke to Dr. BHaroldine Laws ok for picc placement. PICC ordered.  Hypertriglyceridemia HX Lipoprotein lipase deficiency TG on arrival 4040. 1215>989. [facility] Treated with insulin/dextrose infusions. Lipase WNL x3. ABD CT non concerning for pancreatitis. -Continue following lipid panels -Continue fenofibrate and crestor. Consider holding AM dose of crestor due to tranaminits.  -Continue insulin at 0.5u/kg/hr with dextrose in IV fluids overnight.  Acute Kidney Injury Baseline creat 0.8-1. Suspect secondary to a/c HF, cardio renal, contrast exposure. Creatine peaked at 3.91, now 3.73. BUN 78 -Neph following. Appreciate assistance -On bicarbonate 650 tid.  -Ensure renal perfusion. Goal MAP 65 or greater. -Avoid neprotoxic drugs as possible. -Strict I&O's -Follow up AM creatinine  Abdominal pain HX pancreatitis Diarrhea Reason for presentation. ? Related to worsening cardiac function. Admit CT concerning for vasculitis. VS consulted, rec outpatient follow up. Lipase WNL x3. 8/30 ABD CT non concerning for pancreatitis and mesenteric ischemia. ? If secondary to worsening cardiac function. Enteropathogenic E coli (EPEC) seen on PCR on 9/1. No signs of infection at this time. No fever. Denies blood from stool. Diarrhea could be secondary to lactulose -Diet as tolerated -Hydromorphone for breakthrough pain. -monitor abdominal exam -Follow up abdominal [place]   Transaminitis AST 2837, ALT 2075, bili 1.7, ammonia 67. INR 1.5. PLT 119 8/30 CT demonstrated hepatomegaly with advanced hepatic steatosis. ?cardio-hepatic. MELD score 29.  -AM LFTs, and ammonia -NE and dobutamine as discussed -Goal map greater than 65.  -Stop tylenol. -Continue  lactulose -Hope this is related to worsening cardiac function. If no improvement after optimization of cardiac function consider transfer for tertiary care.  Hyponatremia  NA 126-129. Suspect psuedohyponatremia in setting of HF and fluid status -Check serum osm. Will broaden workup based on results. -Monitor on AM labs -No free water.   HX Hypothyroidism -Continue synthroid  DMII A1c 7.8 -Blood Glucose goal 140-180. -On insulin GTT currently but not being titrated. -holding levemir while on insulin gtt   Best Practice (right click and "Reselect all SmartList Selections" daily)   Diet/type: Regular consistency (see orders) DVT prophylaxis: prophylactic heparin  GI prophylaxis: H2B Lines: N/A, plan to place central line. Foley:  N/A Code Status:  full code Last date of multidisciplinary goals of care discussion [Pending]  Labs   CBC: Recent Labs  Lab 11/22/20 1843 11/22/20 2303 11/23/20 0414 11/25/20 0401 11/26/20 0408 11/27/20 0716 11/28/20 0218  WBC 6.7  --  8.1 8.2 9.2 10.7* 9.8  NEUTROABS 4.4  --   --  3.8 5.4 7.3 6.7  HGB 11.0*   < > 11.6* 11.4* 11.5* 10.9* 10.2*  HCT 33.8*   < > 31.9* 33.8* 35.7* 33.4* 32.2*  MCV 87.1  --  87.4 88.7 88.8 85.9 89.0  PLT 170  --  176 152 191 156 119*   < > = values in this interval not displayed.    Basic Metabolic Panel: Recent Labs  Lab 11/25/20 0401 11/26/20 0408 11/27/20 0716 11/28/20 0015 11/28/20 0218 11/28/20 1129 11/28/20 1511 11/28/20 1816  NA 135 131*  129* 126* 126* 126* 129* 130*  K 5.6* 5.2* 5.4* 5.5* 4.7 4.6 4.8 4.8  CL 105 100 103 99 98 99 103 103  CO2 20* 14* 14* 14* 16* 17* 13* 14*  GLUCOSE 212* 230* 219* 218* 135* 71 102* 163*  BUN 42* 53* 63* 65* 67* 73* 78* 74*  CREATININE 1.57* 3.12* 3.20* 3.67* 3.75* 3.91* 3.73* 3.01*  CALCIUM 8.9 9.3 8.6* 8.1* 8.1* 7.8* 7.9* 7.9*  MG 3.0* 2.5* 2.4  --  2.6*  --   --   --    GFR: Estimated Creatinine Clearance: 20.4 mL/min (A) (by C-G formula based on SCr  of 3.01 mg/dL (H)). Recent Labs  Lab 11/22/20 1843 11/22/20 1937 11/22/20 2327 11/23/20 0414 11/25/20 0401 11/26/20 0408 11/27/20 0716 11/28/20 0218 11/28/20 1511  WBC 6.7  --   --    < > 8.2 9.2 10.7* 9.8  --   LATICACIDVEN 2.9* 2.9* 2.0*  --   --   --   --   --  1.3   < > = values in this interval not displayed.    Liver Function Tests: Recent Labs  Lab 11/22/20 1843 11/28/20 1511 11/28/20 1816  AST 101* 2,837* 2,519*  ALT 74* 2,075* 1,914*  ALKPHOS 84 109 105  BILITOT 3.0* 2.6* 2.5*  PROT RESULTS UNAVAILABLE DUE TO INTERFERING SUBSTANCE 6.6 6.5  ALBUMIN 3.7 3.5 3.3*   Recent Labs  Lab 11/22/20 1843 11/25/20 1709 11/27/20 1038  LIPASE 29 21 17    Recent Labs  Lab 11/28/20 1511  AMMONIA 67*    ABG    Component Value Date/Time   HCO3 19.6 (L) 11/22/2020 1734   TCO2 22 11/22/2020 2303   ACIDBASEDEF NOT CALCULATED 11/22/2020 1734   O2SAT NOT CALCULATED 11/22/2020 1734     Coagulation Profile: Recent Labs  Lab 11/28/20 1817  INR 1.5*    Cardiac Enzymes: No results for input(s): CKTOTAL, CKMB, CKMBINDEX, TROPONINI in the last 168 hours.  HbA1C: Hgb A1c MFr Bld  Date/Time Value Ref Range Status  11/23/2020 04:14 AM 7.8 (H) 4.8 - 5.6 % Final    Comment:    (NOTE) Pre diabetes:          5.7%-6.4%  Diabetes:              >6.4%  Glycemic control for   <7.0% adults with diabetes     CBG: Recent Labs  Lab 11/28/20 1823 11/28/20 1923 11/28/20 2004 11/28/20 2103 11/28/20 2203  GLUCAP 188* 190* 167* 193* 171*    Review of Systems:   Positives in bold  Gen: fever, chills, weight change, fatigue, night sweats HEENT:  blurred vision, double vision, hearing loss, tinnitus, sinus congestion, rhinorrhea, sore throat, neck stiffness, dysphagia PULM:  shortness of breath, cough, sputum production, hemoptysis, wheezing CV: chest pain, edema, orthopnea, paroxysmal nocturnal dyspnea, palpitations, DOE GI:  abdominal pain, nausea, vomiting,  diarrhea, hematochezia, melena, constipation, change in bowel habits GU: dysuria, hematuria, polyuria, oliguria, urethral discharge Endocrine: hot or cold intolerance, polyuria, polyphagia or appetite change Derm: rash, dry skin, scaling or peeling skin change Heme: easy bruising, bleeding, bleeding gums Neuro: headache, numbness, weakness, slurred speech, loss of memory or consciousness   Past Medical History:  She,  has a past medical history of Astrocytoma ([place]), Brain tumor ([place]) (03/29/1995), Cholesterosis, Diabetes mellitus without complication ([place]), DM (diabetes mellitus) ([place]) (10/10/2018), Fatty liver, HTN (hypertension) (10/10/2018), Hypertension, Hypothyroidism (10/10/2018), Hypothyroidism, Lipoprotein deficiency, Lung disease, Pancreatitis, Polycystic ovary syndrome, and Thyroid disease.  Surgical History:   Past Surgical History:  Procedure Laterality Date   ABDOMINAL SURGERY     pt states during miscarriage got her intestine   BRAIN SURGERY     EYE MUSCLE SURGERY Right 03/28/2014   VENTRICULOSTOMY  03/28/1997     Social History:   reports that she has never smoked. She has never used smokeless tobacco. She reports that she does not drink alcohol and does not use drugs.   Family History:  Her family history includes Diabetes in her father and mother; Hyperlipidemia in her mother; Hypertension in her father and mother; Thyroid disease in her mother.   Allergies Allergies  Allergen Reactions   Ketamine    Clarithromycin Other (See Comments)    Upset stomach    Shellfish Allergy Swelling    Pt states contrast in CT is okay   Penicillins Rash    Has patient had a PCN reaction causing immediate rash, facial/tongue/throat swelling, SOB or lightheadedness with hypotension: Y Has patient had a PCN reaction causing severe rash involving mucus membranes or skin necrosis: Y Has patient had a PCN reaction that required hospitalization: N Has patient had a PCN reaction  occurring within the last 10 years: Y If all of the above answers are "NO", then may proceed with Cephalosporin use.    Prednisone Rash     Home Medications  Prior to Admission medications   Medication Sig Start Date End Date Taking? Authorizing Provider  Acetylcarnitine HCl (ACETYL L-CARNITINE) 500 MG CAPS Take 500 mg by mouth daily.   Yes [provider]  albuterol (VENTOLIN HFA) 108 (90 Base) MCG/ACT inhaler Inhale 2 puffs into the lungs as needed for wheezing or shortness of breath.  12/31/18  Yes [provider]  Coenzyme Q10 (COQ10 PO) Take 1 tablet by mouth daily.   Yes [provider]  CRANBERRY FRUIT PO Take 1 capsule by mouth 2 (two) times a day.   Yes [provider]  Cyanocobalamin (VITAMIN B12 PO) Take 1 Dose by mouth daily.   Yes [provider]  cyclobenzaprine (FLEXERIL) 10 MG tablet Take 10 mg by mouth as needed. for muscle spams 11/19/18  Yes [provider]  dicyclomine (BENTYL) 10 MG capsule Take 10 mg by mouth 3 (three) times daily as needed for spasms.  02/11/19  Yes [provider]  DULoxetine (CYMBALTA) 30 MG capsule Take 60 mg by mouth daily.  01/28/19 11/23/20 Yes [provider]  famotidine (PEPCID) 20 MG tablet Take 20 mg by mouth 2 (two) times daily.  02/11/19  Yes [provider]  fenofibrate (TRICOR) 145 MG tablet Take 145 mg by mouth daily. 01/14/19  Yes [provider]  fluticasone furoate-vilanterol (BREO ELLIPTA) 100-25 MCG/INH AEPB Inhale 1 puff into the lungs as needed (shortness of breath).  01/21/19  Yes [provider]  furosemide (LASIX) 20 MG tablet Take 1 tablet (20 mg total) by mouth daily. 10/07/20  Yes Deno Etienne, DO  ibuprofen (ADVIL) 600 MG tablet Take 1 tablet (600 mg total) by mouth every 6 (six) hours as needed. 10/19/18  Yes Law, Bea Graff, PA-C  Insulin Glargine, 1 Unit Dial, (TOUJEO SOLOSTAR) 300 UNIT/ML SOPN Inject 94 Units into the skin in the  morning and at bedtime.   Yes [provider]  levothyroxine (SYNTHROID) 150 MCG tablet Take 150 mcg by mouth daily. 01/14/19  Yes [provider]  metoprolol (TOPROL-XL) 200 MG 24 hr tablet Take 200 mg by mouth daily.   Yes  [provider]  metoprolol succinate (TOPROL-XL) 50 MG 24 hr tablet Take 50 mg by mouth daily. Take along w/ 233m Daily   Yes [provider]  norethindrone-ethinyl estradiol (OVCON-35) 0.4-35 MG-MCG tablet Take 1 tablet by mouth daily.   Yes [provider]  NOVOLOG FLEXPEN 100 UNIT/ML FlexPen Inject 60 Units into the skin 3 (three) times daily. Every meal 04/19/19  Yes [provider]  ondansetron (ZOFRAN) 4 MG tablet Take 4 mg by mouth as needed for nausea or vomiting.  12/19/18  Yes [provider]  pantoprazole (PROTONIX) 40 MG tablet Take 40 mg by mouth daily. 12/24/19  Yes [provider]  pregabalin (LYRICA) 300 MG capsule Take 300 mg by mouth 2 (two) times daily.    Yes [provider]  prochlorperazine (COMPAZINE) 10 MG tablet Take 1 tablet (10 mg total) by mouth 2 (two) times daily as needed for nausea or vomiting. 04/18/20  Yes RPattricia Boss MD  rosuvastatin (CRESTOR) 40 MG tablet Take 40 mg by mouth daily.   Yes [provider]  sacubitril-valsartan (ENTRESTO) 24-26 MG Take 1 tablet by mouth 2 (two) times daily.   Yes [provider]  ALPHA LIPOIC ACID-BIOTIN PO Take 200 mg by mouth daily.  Patient not taking: Reported on 11/23/2020    [provider]  ENTRESTO 97-103 MG TAKE ONE TABLET BY MOUTH TWICE A DAY Patient not taking: Reported on 11/23/2020 08/10/20   GAdrian Prows MD  HYDROcodone-acetaminophen (NORCO/VICODIN) 5-325 MG tablet Take 1 tablet by mouth every 6 (six) hours as needed. Patient not taking: Reported on 11/23/2020 05/10/19   WRipley Fraise MD  metoprolol tartrate (LOPRESSOR) 100 MG tablet TAKE ONE TABLET BY MOUTH TWICE A DAY Patient not taking:  Reported on 11/23/2020 08/10/20   GAdrian Prows MD  nitroGLYCERIN (NITROSTAT) 0.4 MG SL tablet Place 1 tablet (0.4 mg total) under the tongue every 5 (five) minutes as needed for up to 25 days for chest pain. 08/01/19 08/26/19  GAdrian Prows MD     Critical care time: 365minutes    TRedmond School, MSN, APRN, AGACNP-BC [facility]  11/28/2020 , 10:58 PM  Please see [email or web address] for pager details  If no response, please call [phone] After hours, please call Elink at [phone]

## 2020-11-28 NOTE — Progress Notes (Addendum)
Called lab about pending BMP from [place].  Per lab, sample needs to be sent to [facility] for processing d/t pt's lipemia.  Per lab, will call [facility] again for STAT pickup.   [place], APP notified  Per Kennon Holter, APP, [place] to wait for AM labs.

## 2020-11-28 NOTE — Progress Notes (Signed)
Abdominal Ultrasound attempted. Patient transferred to [facility] per Josiah Lobo, RN

## 2020-11-28 NOTE — Progress Notes (Signed)
*  PRELIMINARY RESULTS* Echocardiogram 2D Echocardiogram has been performed.  Luisa Hart RDCS 11/28/2020, 1:03 PM

## 2020-11-28 NOTE — Consult Note (Addendum)
Advanced Heart Failure Team Consult Note   Primary Physician: Sue Lush, PA-C PCP-Cardiologist:  None  Reason for Consultation: Cardiogenic shock  HPI:    Jennifer [name] is seen today for evaluation of cardiogenic shock at the request of Dr. Terri Skains.   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  Has been followed by Dr. Einar Gip for her HF.    Cardiac MRI  08/04/2017 ([facility]):  1. The left ventricle is normal in size and has moderately decreased function EF 42% There is mid to basal inferoseptal hypokinesis.  The right ventricle is normal in size and has mildly decreased function. 2. There is evidence of subepicardial enhancement of the mid inferoseptal wall segment, which is compatible with myocarditis. There is also a very small focal subendocardial enhancement in the mid inferoseptum which is compatible with infarction.  3. There is evidence of myocardial edema of the mid epicardial inferoseptum which is compatible with acute myocarditis. 4. Small circumferential pericardial effusion  Echo 1/21: EF 40%. Mod MR/TR (Dr. Einar Gip) Echo 1/22: EF 35-40% Mod MR   Lexiscan 2/21: EF 38% Normal perfusion  Admitted 8/28 with 2 weeks ab pain and diarrhea. Ab CT concerning for vasculitis (no pancreatitis).  Patient was evaluated by vascular surgery no recommendations provided with possible outpatient follow-up with rheumatology.  Her TG were found to be  4030 on 8/31. She has a history of LPL deficiency and follows with [facility] with Dr. Garrel Ridgel. She was started on an insulin/dextrose infusion on 9/3. TG [phone]. Lipase was remained WNL   Cardiology (Dr. Terri Skains) and Nephrology were consulted on 9/3 after her creatinine and LFTs continued to rise and she began developing hypotension.    Echo 11/28/20 EF was found to be 20-25% with moderate RV dysfunction and mild septal flattening. Started on DBA 2.5    Denies CP. + SOB and mild edema. No fevers, myalgias or rashes.   Cr 1.57 -> 3.12 -> 3.91 -> 3.01 AST 101 -> 2,837 -> 2,519 ALT 74 -> 2,075 -> 1.914 Bili 2.5 ESR 29  Review of Systems: [y] = yes, [ ]  = no   General: Weight gain [ ] ; Weight loss [ ] ; Anorexia [ y]; Fatigue Blue.Reese ]; Fever [ ] ; Chills [ ] ; Weakness [ ]   Cardiac: Chest pain/pressure [ ] ; Resting SOB [ ] ; Exertional SOB Blue.Reese ]; Orthopnea [ ] ; Pedal Edema Blue.Reese ]; Palpitations [ ] ; Syncope [ ] ; Presyncope [ ] ; Paroxysmal nocturnal dyspnea[ ]   Pulmonary: Cough [ ] ; Wheezing[ ] ; Hemoptysis[ ] ; Sputum [ ] ; Snoring [ ]   GI: Vomiting[ ] ; Dysphagia[ ] ; Melena[ ] ; Hematochezia [ ] ; Heartburn[ ] ; Abdominal pain Blue.Reese ]; Constipation [ ] ; Diarrhea [ y]; BRBPR [ ]   GU: Hematuria[ ] ; Dysuria [ ] ; Nocturia[ ]   Vascular: Pain in legs with walking [ ] ; Pain in feet with lying flat [ ] ; Non-healing sores [ ] ; Stroke [ y]; TIA [ ] ; Slurred speech [ ] ;  Neuro: Headaches[ ] ; Vertigo[ ] ; Seizures[ ] ; Paresthesias[ ] ;Blurred vision [ ] ; Diplopia [ ] ; Vision changes [ ]   Ortho/Skin: Arthritis [ y]; Joint pain Blue.Reese ]; Muscle pain [ ] ; Joint swelling [ ] ; Back Pain [ y]; Rash [ ]   Psych: Depression[y ]; Anxiety[ ]   Heme: Bleeding problems [ ] ; Clotting disorders [ ] ; Anemia Blue.Reese ]  Endocrine: Diabetes Blue.Reese ]; Thyroid dysfunction[ y]  Home Medications Prior to Admission medications   Medication Sig Start Date End Date Taking? Authorizing Provider  Acetylcarnitine HCl (ACETYL L-CARNITINE) 500 MG CAPS Take 500 mg by mouth daily.   Yes [provider]  albuterol (VENTOLIN HFA) 108 (90 Base) MCG/ACT inhaler Inhale 2 puffs into the lungs as needed for wheezing or shortness of breath.  12/31/18  Yes [provider]  Coenzyme Q10 (COQ10 PO) Take 1 tablet by mouth daily.   Yes [provider]  CRANBERRY FRUIT PO Take 1 capsule by  mouth 2 (two) times a day.   Yes [provider]  Cyanocobalamin (VITAMIN B12 PO) Take 1 Dose by mouth daily.   Yes [provider]  cyclobenzaprine (FLEXERIL) 10 MG tablet Take 10 mg by mouth as needed. for muscle spams 11/19/18  Yes [provider]  dicyclomine (BENTYL) 10 MG capsule Take 10 mg by mouth 3 (three) times daily as needed for spasms.  02/11/19  Yes [provider]  DULoxetine (CYMBALTA) 30 MG capsule Take 60 mg by mouth daily.  01/28/19 11/23/20 Yes [provider]  famotidine (PEPCID) 20 MG tablet Take 20 mg by mouth 2 (two) times daily.  02/11/19  Yes [provider]  fenofibrate (TRICOR) 145 MG tablet Take 145 mg by mouth daily. 01/14/19  Yes [provider]  fluticasone furoate-vilanterol (BREO ELLIPTA) 100-25 MCG/INH AEPB Inhale 1 puff into the lungs as needed (shortness of breath).  01/21/19  Yes [provider]  furosemide (LASIX) 20 MG tablet Take 1 tablet (20 mg total) by mouth daily. 10/07/20  Yes Deno Etienne, DO  ibuprofen (ADVIL) 600 MG tablet Take 1 tablet (600 mg total) by mouth every 6 (six) hours as needed. 10/19/18  Yes Law, Bea Graff, PA-C  Insulin Glargine, 1 Unit Dial, (TOUJEO SOLOSTAR) 300 UNIT/ML SOPN Inject 94 Units into the skin in the morning and at bedtime.   Yes [provider]  levothyroxine (SYNTHROID) 150 MCG tablet Take 150 mcg by mouth daily. 01/14/19  Yes [provider]  metoprolol (TOPROL-XL) 200 MG 24 hr tablet Take 200 mg by mouth daily.   Yes [provider]  metoprolol succinate (TOPROL-XL) 50 MG 24 hr tablet Take 50 mg by mouth daily. Take along w/ 23m Daily   Yes [provider]  norethindrone-ethinyl estradiol (OVCON-35) 0.4-35 MG-MCG tablet Take 1 tablet by mouth daily.   Yes [provider]  NOVOLOG FLEXPEN 100 UNIT/ML FlexPen Inject 60 Units into the skin 3 (three) times daily. Every meal 04/19/19  Yes [provider]   ondansetron (ZOFRAN) 4 MG tablet Take 4 mg by mouth as needed for nausea or vomiting.  12/19/18  Yes [provider]  pantoprazole (PROTONIX) 40 MG tablet Take 40 mg by mouth daily. 12/24/19  Yes [provider]  pregabalin (LYRICA) 300 MG capsule Take 300 mg by mouth 2 (two) times daily.    Yes [provider]  prochlorperazine (COMPAZINE) 10 MG tablet Take 1 tablet (10 mg total) by mouth 2 (two) times daily as needed for nausea or vomiting. 04/18/20  Yes RPattricia Boss MD  rosuvastatin (CRESTOR) 40 MG tablet Take 40 mg by mouth daily.   Yes [provider]  sacubitril-valsartan (ENTRESTO) 24-26 MG Take 1 tablet by mouth 2 (two) times daily.   Yes [provider]  ALPHA LIPOIC ACID-BIOTIN PO Take 200 mg by mouth daily.  Patient not taking: Reported on 11/23/2020  [provider]  ENTRESTO 97-103 MG TAKE ONE TABLET BY MOUTH TWICE A DAY Patient not taking: Reported on 11/23/2020 08/10/20   [name], MD  HYDROcodone-acetaminophen (NORCO/VICODIN) 5-325 MG tablet Take 1 tablet by mouth every 6 (six) hours as needed. Patient not taking: Reported on 11/23/2020 05/10/19   Ripley Fraise, MD  metoprolol tartrate (LOPRESSOR) 100 MG tablet TAKE ONE TABLET BY MOUTH TWICE A DAY Patient not taking: Reported on 11/23/2020 08/10/20   [name], MD  nitroGLYCERIN (NITROSTAT) 0.4 MG SL tablet Place 1 tablet (0.4 mg total) under the tongue every 5 (five) minutes as needed for up to 25 days for chest pain. 08/01/19 08/26/19  [name], MD    Past Medical History: Past Medical History:  Diagnosis Date   Astrocytoma [facility])    Brain tumor ([place]) 03/29/1995   Cholesterosis    Diabetes mellitus without complication (HCC)    DM (diabetes mellitus) ([place]) 10/10/2018   Fatty liver    HTN (hypertension) 10/10/2018   Hypertension    Hypothyroidism 10/10/2018   Hypothyroidism    Lipoprotein deficiency    Lung disease    longevity long term   Pancreatitis    Polycystic  ovary syndrome    Thyroid disease     Past Surgical History: Past Surgical History:  Procedure Laterality Date   ABDOMINAL SURGERY     pt states during miscarriage got her intestine   BRAIN SURGERY     EYE MUSCLE SURGERY Right 03/28/2014   VENTRICULOSTOMY  03/28/1997    Family History: Family History  Problem Relation Age of Onset   Diabetes Mother    Hypertension Mother    Hyperlipidemia Mother    Thyroid disease Mother    Hypertension Father    Diabetes Father     Social History: Social History   Socioeconomic History   Marital status: Married    Spouse name: Not on file   Number of children: 0   Years of education: Not on file   Highest education level: Not on file  Occupational History   Not on file  Tobacco Use   Smoking status: Never   Smokeless tobacco: Never  Vaping Use   Vaping Use: Never used  Substance and Sexual Activity   Alcohol use: Never   Drug use: Never   Sexual activity: Not on file  Other Topics Concern   Not on file  Social History Narrative   Not on file   Social Determinants of Health   Financial Resource Strain: Not on file  Food Insecurity: Not on file  Transportation Needs: Not on file  Physical Activity: Not on file  Stress: Not on file  Social Connections: Not on file    Allergies:  Allergies  Allergen Reactions   Ketamine    Clarithromycin Other (See Comments)    Upset stomach    Shellfish Allergy Swelling    Pt states contrast in CT is okay   Penicillins Rash    Has patient had a PCN reaction causing immediate rash, facial/tongue/throat swelling, SOB or lightheadedness with hypotension: Y Has patient had a PCN reaction causing severe rash involving mucus membranes or skin necrosis: Y Has patient had a PCN reaction that required hospitalization: N Has patient had a PCN reaction occurring within the last 10 years: Y If all of the above answers are "NO", then may proceed with Cephalosporin use.    Prednisone Rash     Objective:    Vital Signs:   Temp:  [97.8  F (36.6 C)-99.2 F (37.3 C)] 98.2 F (36.8 C) (09/03 2200) Pulse Rate:  [39-109] 85 (09/03 2245) Resp:  [16-32] 23 (09/03 2245) BP: (79-138)/(37-88) 133/84 (09/03 2200) SpO2:  [92 %-100 %] 98 % (09/03 2245) Last BM Date: 11/28/20  Weight change: Filed Weights   11/23/20 0355 11/27/20 2150  Weight: 55.8 kg 59 kg    Intake/Output:   Intake/Output Summary (Last 24 hours) at 11/28/2020 2316 Last data filed at 11/28/2020 2213 Gross per 24 hour  Intake 1265.19 ml  Output 800 ml  Net 465.19 ml      Physical Exam    General:  Lying in bed No resp difficulty HEENT: normal x for R ptsosis Neck: supple. JVP to jaw  . Carotids 2+ bilat; no bruits. No lymphadenopathy or thyromegaly appreciated. Cor: PMI nondisplaced. Regular rate & rhythm. No rubs, gallops or murmurs. Lungs: clear Abdomen: soft, diffusely tender, + distended. Liver edge down . No bruits or masses. Good bowel sounds. Extremities: no cyanosis, clubbing, rash, 1+ edema Neuro: alert & orientedx3, cranial nerves grossly intact. Left-sided weakness Affect pleasant   Telemetry   NSR 80s Personally reviewed  EKG    Sinus tach 107 No ST-T wave abnormalities.Personally reviewed  Labs   Basic Metabolic Panel: Recent Labs  Lab 11/25/20 0401 11/26/20 0408 11/27/20 0716 11/28/20 0015 11/28/20 0218 11/28/20 1129 11/28/20 1511 11/28/20 1816  NA 135 131* 129* 126* 126* 126* 129* 130*  K 5.6* 5.2* 5.4* 5.5* 4.7 4.6 4.8 4.8  CL 105 100 103 99 98 99 103 103  CO2 20* 14* 14* 14* 16* 17* 13* 14*  GLUCOSE 212* 230* 219* 218* 135* 71 102* 163*  BUN 42* 53* 63* 65* 67* 73* 78* 74*  CREATININE 1.57* 3.12* 3.20* 3.67* 3.75* 3.91* 3.73* 3.01*  CALCIUM 8.9 9.3 8.6* 8.1* 8.1* 7.8* 7.9* 7.9*  MG 3.0* 2.5* 2.4  --  2.6*  --   --   --     Liver Function Tests: Recent Labs  Lab 11/22/20 1843 11/28/20 1511 11/28/20 1816  AST 101* 2,837* 2,519*  ALT 74* 2,075* 1,914*   ALKPHOS 84 109 105  BILITOT 3.0* 2.6* 2.5*  PROT RESULTS UNAVAILABLE DUE TO INTERFERING SUBSTANCE 6.6 6.5  ALBUMIN 3.7 3.5 3.3*   Recent Labs  Lab 11/22/20 1843 11/25/20 1709 11/27/20 1038  LIPASE 29 21 17    Recent Labs  Lab 11/28/20 1511  AMMONIA 67*    CBC: Recent Labs  Lab 11/22/20 1843 11/22/20 2303 11/23/20 0414 11/25/20 0401 11/26/20 0408 11/27/20 0716 11/28/20 0218  WBC 6.7  --  8.1 8.2 9.2 10.7* 9.8  NEUTROABS 4.4  --   --  3.8 5.4 7.3 6.7  HGB 11.0*   < > 11.6* 11.4* 11.5* 10.9* 10.2*  HCT 33.8*   < > 31.9* 33.8* 35.7* 33.4* 32.2*  MCV 87.1  --  87.4 88.7 88.8 85.9 89.0  PLT 170  --  176 152 191 156 119*   < > = values in this interval not displayed.    Cardiac Enzymes: No results for input(s): CKTOTAL, CKMB, CKMBINDEX, TROPONINI in the last 168 hours.  BNP: BNP (last 3 results) Recent Labs    11/23/20 0224 11/28/20 1511  BNP 48.9 529.6*    ProBNP (last 3 results) No results for input(s): PROBNP in the last 8760 hours.   CBG: Recent Labs  Lab 11/28/20 1923 11/28/20 2004 11/28/20 2103 11/28/20 2203 11/28/20 2309  GLUCAP 190* 167* 193* 171* 186*    Coagulation Studies:  Recent Labs    11/28/20 1817  LABPROT 17.9*  INR 1.5*     Imaging   ECHOCARDIOGRAM COMPLETE  Result Date: 11/28/2020    ECHOCARDIOGRAM REPORT   Patient Name:   Jennifer [name] Date of Exam: 11/28/2020 Medical Rec #:  [id number]         Height:       57.0 in Accession #:    [id number]        Weight:       130.1 lb Date of Birth:  08-30-1990         BSA:          1.498 m Patient Age:    30 years          BP:           112/81 mmHg Patient Gender: F                 HR:           80 bpm. Exam Location:  Inpatient Procedure: 2D Echo, Cardiac Doppler and Color Doppler Indications:    CHF  History:        Patient has prior history of Echocardiogram examinations, most                 recent 04/15/2019.  Sonographer:    Luisa Hart RDCS Referring Phys: [place]  1. Left ventricular ejection fraction, by estimation, is 25 to 30%. The left ventricle has severely decreased function. The left ventricle demonstrates global hypokinesis. Left ventricular diastolic parameters are consistent with Grade II diastolic dysfunction (pseudonormalization). Elevated left atrial pressure. There is the interventricular septum is flattened in diastole ('D' shaped left ventricle), consistent with right ventricular volume overload.  2. Right ventricular systolic function is mildly reduced. The right ventricular size is normal. There is mildly elevated pulmonary artery systolic pressure.  3. The mitral valve is normal in structure. Mild to moderate mitral valve regurgitation.  4. Tricuspid valve regurgitation is moderate to severe.  5. The aortic valve is normal in structure. Aortic valve regurgitation is mild. FINDINGS  Left Ventricle: Left ventricular ejection fraction, by estimation, is 25 to 30%. The left ventricle has severely decreased function. The left ventricle demonstrates global hypokinesis. The left ventricular internal cavity size was normal in size. There is no left ventricular hypertrophy. The interventricular septum is flattened in diastole ('D' shaped left ventricle), consistent with right ventricular volume overload. Left ventricular diastolic parameters are consistent with Grade II diastolic dysfunction (pseudonormalization). Elevated left atrial pressure. Right Ventricle: The right ventricular size is normal. No increase in right ventricular wall thickness. Right ventricular systolic function is mildly reduced. There is mildly elevated pulmonary artery systolic pressure. The tricuspid regurgitant velocity  is 2.96 m/s, and with an assumed right atrial pressure of 3 mmHg, the estimated right ventricular systolic pressure is 28.4 mmHg. Left Atrium: Left atrial size was normal in size. Right Atrium: Right atrial size was normal in size. Pericardium: There is no  evidence of pericardial effusion. Mitral Valve: The mitral valve is normal in structure. Mild to moderate mitral valve regurgitation, with centrally-directed jet. Tricuspid Valve: The tricuspid valve is normal in structure. Tricuspid valve regurgitation is moderate to severe. Aortic Valve: The aortic valve is normal in structure. Aortic valve regurgitation is mild. Aortic regurgitation PHT measures 367 msec. Aortic valve mean gradient measures 3.0 mmHg. Aortic valve peak gradient measures 4.8 mmHg. Aortic valve area, by VTI measures 1.93 cm. Pulmonic Valve: The  pulmonic valve was normal in structure. Pulmonic valve regurgitation is trivial. Aorta: The aortic root was not well visualized. IAS/Shunts: No atrial level shunt detected by color flow Doppler.  LEFT VENTRICLE PLAX 2D LVIDd:         4.40 cm     Diastology LVIDs:         3.80 cm     LV e' medial:    5.11 cm/s LV PW:         0.90 cm     LV E/e' medial:  17.8 LV IVS:        1.00 cm     LV e' lateral:   9.03 cm/s LVOT diam:     1.80 cm     LV E/e' lateral: 10.1 LV SV:         37 LV SV Index:   25 LVOT Area:     2.54 cm  LV Volumes (MOD) LV vol d, MOD A4C: 70.3 ml LV vol s, MOD A4C: 51.3 ml LV SV MOD A4C:     70.3 ml RIGHT VENTRICLE RV S prime:     9.36 cm/s TAPSE (M-mode): 1.3 cm LEFT ATRIUM             Index       RIGHT ATRIUM           Index LA diam:        3.30 cm 2.20 cm/m  RA Area:     11.20 cm LA Vol (A2C):   27.7 ml 18.49 ml/m RA Volume:   21.70 ml  14.49 ml/m LA Vol (A4C):   22.8 ml 15.22 ml/m LA Biplane Vol: 25.3 ml 16.89 ml/m  AORTIC VALVE                   PULMONIC VALVE AV Area (Vmax):    2.18 cm    PV Vmax:          0.84 m/s AV Area (Vmean):   2.04 cm    PV Vmean:         57.000 cm/s AV Area (VTI):     1.93 cm    PV VTI:           0.140 m AV Vmax:           109.00 cm/s PV Peak grad:     2.8 mmHg AV Vmean:          73.900 cm/s PV Mean grad:     2.0 mmHg AV VTI:            0.191 m     PR End Diast Vel: 2.92 msec AV Peak Grad:      4.8 mmHg  AV Mean Grad:      3.0 mmHg LVOT Vmax:         93.50 cm/s LVOT Vmean:        59.100 cm/s LVOT VTI:          0.145 m LVOT/AV VTI ratio: 0.76 AI PHT:            367 msec AR Vena Contracta: 0.20 cm  AORTA Ao Root diam: 2.20 cm Ao Asc diam:  2.50 cm MITRAL VALVE                TRICUSPID VALVE MV Area (PHT): 6.27 cm     TR Peak grad:   35.0 mmHg MV Decel Time: 121 msec     TR Vmax:  296.00 cm/s MR Vena Contracta: 0.30 cm MR PISA:           1.01 cm SHUNTS MR PISA Radius:    0.40 cm  Systemic VTI:  0.14 m MV E velocity: 90.80 cm/s   Systemic Diam: 1.80 cm MV A velocity: 32.30 cm/s MV E/A ratio:  2.81 Mihai Croitoru MD Electronically signed by Sanda Klein MD Signature Date/Time: 11/28/2020/1:16:35 PM    Final      Medications:     Current Medications:  Chlorhexidine Gluconate Cloth  6 each Topical Daily   DULoxetine  60 mg Oral Daily   famotidine  10 mg Oral Daily   fenofibrate  160 mg Oral Daily   heparin injection (subcutaneous)  5,000 Units Subcutaneous Q8H   lactulose  20 g Oral TID   [START ON 11/29/2020] levothyroxine  125 mcg Oral Q0600   lidocaine  15 mL Oral QID   norethindrone-ethinyl estradiol  1 tablet Oral Daily   rosuvastatin  40 mg Oral Daily   sodium bicarbonate  650 mg Oral TID   sodium chloride flush  10-40 mL Intracatheter Q12H   sodium chloride flush  3 mL Intravenous Q12H    Infusions:  sodium chloride     sodium chloride     dextrose 5 % and 0.9% NaCl 50 mL/hr at 11/28/20 2213   DOBUTamine 2.5 mcg/kg/min (11/28/20 2240)   insulin 0.05 Units/kg/hr (11/28/20 2213)   norepinephrine (LEVOPHED) Adult infusion        Assessment/Plan   1. Acute on chronic systolic HF -> cardiogenic shock - likely NICM - Echo 1/22: EF 35-40% Mod MR - Echo 11/28/20 20-25% with moderate RV dysfunction and mild septal flattening. Mod-severe TR - Suspect recurrent myocarditis - Will check hstrop - Agree with dobutamine. - Place central access (PICC ok) to follow CVP and co-ox. Titrate  drips as needed - Volume overloaded. Will attempt diuresis in am - Repeat cMRI. May need RHC - check Rheum serologies.  - Consider stress-dose steroids if not responding to inotrope support  2. AKI  - Due to ATN/shock  - Scr 1.57 -> 3.12 -> 3.91 -> 3.01 - support with inotropes pressors. Keep MAPs > 65  3. Shock liver - continue hemodynamic support - consider holding statin - CT shows severe hepatic steatosis  4. Severe hyperTG - - due to LPL deficiency - improving with insulin gtt  5. Type I DM - per CCM  CRITICAL CARE Performed by: Glori Bickers  Total critical care time: 55 minutes  Critical care time was exclusive of separately billable procedures and treating other patients.  Critical care was necessary to treat or prevent imminent or life-threatening deterioration.  Critical care was time spent personally by me (independent of midlevel providers or residents) on the following activities: development of treatment plan with patient and/or surrogate as well as nursing, discussions with consultants, evaluation of patient's response to treatment, examination of patient, obtaining history from patient or surrogate, ordering and performing treatments and interventions, ordering and review of laboratory studies, ordering and review of radiographic studies, pulse oximetry and re-evaluation of patient's condition.    Length of Stay: 5  Glori Bickers, MD  11/28/2020, 11:16 PM  Advanced Heart Failure Team Pager [phone] (M-F; 7a - 5p)  Please contact [facility] for night-coverage after hours (4p -7a ) and weekends on [email or web address]

## 2020-11-28 NOTE — Assessment & Plan Note (Addendum)
-   BP has been further downtrending past few days; unclear etiology; no fever or leukocytosis and does not appear septic/infected  - differential includes adrenal insufficiency vs early cardiogenic shock vs volume depletion from poor intake vs polypharmacy (d/c cymbalta, lyrica, bentyl, oxycodone, and lower dilaudid dose) - checking cortisol; test dose of solu-medrol to see if any benefit as well  - hold off on fluid challenge for now

## 2020-11-29 ENCOUNTER — Inpatient Hospital Stay (HOSPITAL_COMMUNITY): Payer: BC Managed Care – PPO

## 2020-11-29 DIAGNOSIS — R7401 Elevation of levels of liver transaminase levels: Secondary | ICD-10-CM

## 2020-11-29 DIAGNOSIS — I5041 Acute combined systolic (congestive) and diastolic (congestive) heart failure: Secondary | ICD-10-CM

## 2020-11-29 DIAGNOSIS — K7200 Acute and subacute hepatic failure without coma: Secondary | ICD-10-CM

## 2020-11-29 DIAGNOSIS — R570 Cardiogenic shock: Secondary | ICD-10-CM | POA: Diagnosis not present

## 2020-11-29 DIAGNOSIS — R109 Unspecified abdominal pain: Secondary | ICD-10-CM | POA: Diagnosis not present

## 2020-11-29 LAB — LIPID PANEL
Cholesterol: 442 mg/dL — ABNORMAL HIGH (ref 0–200)
Cholesterol: 486 mg/dL — ABNORMAL HIGH (ref 0–200)
Cholesterol: 423 mg/dL — ABNORMAL HIGH (ref 0–200)
HDL: 21 mg/dL — ABNORMAL LOW (ref >40)
HDL: 22 mg/dL — ABNORMAL LOW (ref >40)
LDL Cholesterol: UNDETERMINED mg/dL (ref 0–99)
LDL Cholesterol: UNDETERMINED mg/dL (ref 0–99)
LDL Cholesterol: UNDETERMINED mg/dL (ref 0–99)
Total CHOL/HDL Ratio: 21 RATIO
Total CHOL/HDL Ratio: 22.1 RATIO
Triglycerides: 787 mg/dL — ABNORMAL HIGH (ref <150)
Triglycerides: 908 mg/dL — ABNORMAL HIGH (ref <150)
Triglycerides: 1801 mg/dL — ABNORMAL HIGH (ref <150)
VLDL: UNDETERMINED mg/dL (ref 0–40)
VLDL: UNDETERMINED mg/dL (ref 0–40)
VLDL: UNDETERMINED mg/dL (ref 0–40)

## 2020-11-29 LAB — COMPREHENSIVE METABOLIC PANEL
ALT: 1671 U/L — ABNORMAL HIGH (ref 0–44)
AST: 1252 U/L — ABNORMAL HIGH (ref 15–41)
Albumin: 2.9 g/dL — ABNORMAL LOW (ref 3.5–5.0)
Alkaline Phosphatase: 124 U/L (ref 38–126)
Anion gap: 12 (ref 5–15)
BUN: 64 mg/dL — ABNORMAL HIGH (ref 6–20)
CO2: 16 mmol/L — ABNORMAL LOW (ref 22–32)
Calcium: 7.8 mg/dL — ABNORMAL LOW (ref 8.9–10.3)
Chloride: 103 mmol/L (ref 98–111)
Creatinine, Ser: 3.11 mg/dL — ABNORMAL HIGH (ref 0.44–1.00)
GFR, Estimated: 20 mL/min — ABNORMAL LOW (ref >60)
Glucose, Bld: 237 mg/dL — ABNORMAL HIGH (ref 70–99)
Potassium: 4.4 mmol/L (ref 3.5–5.1)
Sodium: 131 mmol/L — ABNORMAL LOW (ref 135–145)
Total Bilirubin: 2.6 mg/dL — ABNORMAL HIGH (ref 0.3–1.2)
Total Protein: 6 g/dL — ABNORMAL LOW (ref 6.5–8.1)

## 2020-11-29 LAB — CBC WITH DIFFERENTIAL/PLATELET
Abs Immature Granulocytes: 0.19 10*3/uL — ABNORMAL HIGH (ref 0.00–0.07)
Abs Immature Granulocytes: 0.23 10*3/uL — ABNORMAL HIGH (ref 0.00–0.07)
Basophils Absolute: 0 10*3/uL (ref 0.0–0.1)
Basophils Absolute: 0 10*3/uL (ref 0.0–0.1)
Basophils Relative: 0 %
Basophils Relative: 0 %
Eosinophils Absolute: 0 10*3/uL (ref 0.0–0.5)
Eosinophils Absolute: 0 10*3/uL (ref 0.0–0.5)
Eosinophils Relative: 0 %
Eosinophils Relative: 0 %
HCT: 28.7 % — ABNORMAL LOW (ref 36.0–46.0)
HCT: 29.6 % — ABNORMAL LOW (ref 36.0–46.0)
Hemoglobin: 9.3 g/dL — ABNORMAL LOW (ref 12.0–15.0)
Hemoglobin: 9.6 g/dL — ABNORMAL LOW (ref 12.0–15.0)
Immature Granulocytes: 2 %
Immature Granulocytes: 2 %
Lymphocytes Relative: 13 %
Lymphocytes Relative: 12 %
Lymphs Abs: 1.5 10*3/uL (ref 0.7–4.0)
Lymphs Abs: 1.4 10*3/uL (ref 0.7–4.0)
MCH: 28.2 pg (ref 26.0–34.0)
MCH: 28.3 pg (ref 26.0–34.0)
MCHC: 32.4 g/dL (ref 30.0–36.0)
MCHC: 32.4 g/dL (ref 30.0–36.0)
MCV: 87 fL (ref 80.0–100.0)
MCV: 87.3 fL (ref 80.0–100.0)
Monocytes Absolute: 0.5 10*3/uL (ref 0.1–1.0)
Monocytes Absolute: 0.5 10*3/uL (ref 0.1–1.0)
Monocytes Relative: 4 %
Monocytes Relative: 5 %
Neutro Abs: 9.4 10*3/uL — ABNORMAL HIGH (ref 1.7–7.7)
Neutro Abs: 8.8 10*3/uL — ABNORMAL HIGH (ref 1.7–7.7)
Neutrophils Relative %: 81 %
Neutrophils Relative %: 81 %
Platelets: 91 10*3/uL — ABNORMAL LOW (ref 150–400)
Platelets: 95 10*3/uL — ABNORMAL LOW (ref 150–400)
RBC: 3.3 MIL/uL — ABNORMAL LOW (ref 3.87–5.11)
RBC: 3.39 MIL/uL — ABNORMAL LOW (ref 3.87–5.11)
RDW: 16.4 % — ABNORMAL HIGH (ref 11.5–15.5)
RDW: 16.5 % — ABNORMAL HIGH (ref 11.5–15.5)
WBC: 11.5 10*3/uL — ABNORMAL HIGH (ref 4.0–10.5)
WBC: 11 10*3/uL — ABNORMAL HIGH (ref 4.0–10.5)
nRBC: 0 % (ref 0.0–0.2)
nRBC: 0.2 % (ref 0.0–0.2)

## 2020-11-29 LAB — LDL CHOLESTEROL, DIRECT
Direct LDL: 134 mg/dL — ABNORMAL HIGH (ref 0–99)
Direct LDL: 139.2 mg/dL — ABNORMAL HIGH (ref 0–99)

## 2020-11-29 LAB — BASIC METABOLIC PANEL
Anion gap: 14 (ref 5–15)
Anion gap: 10 (ref 5–15)
Anion gap: 15 (ref 5–15)
BUN: 67 mg/dL — ABNORMAL HIGH (ref 6–20)
BUN: 54 mg/dL — ABNORMAL HIGH (ref 6–20)
BUN: 51 mg/dL — ABNORMAL HIGH (ref 6–20)
CO2: 14 mmol/L — ABNORMAL LOW (ref 22–32)
CO2: 19 mmol/L — ABNORMAL LOW (ref 22–32)
CO2: 19 mmol/L — ABNORMAL LOW (ref 22–32)
Calcium: 7.6 mg/dL — ABNORMAL LOW (ref 8.9–10.3)
Calcium: 7.8 mg/dL — ABNORMAL LOW (ref 8.9–10.3)
Calcium: 8.1 mg/dL — ABNORMAL LOW (ref 8.9–10.3)
Chloride: 100 mmol/L (ref 98–111)
Chloride: 104 mmol/L (ref 98–111)
Chloride: 100 mmol/L (ref 98–111)
Creatinine, Ser: 3.38 mg/dL — ABNORMAL HIGH (ref 0.44–1.00)
Creatinine, Ser: 2.57 mg/dL — ABNORMAL HIGH (ref 0.44–1.00)
Creatinine, Ser: 2.55 mg/dL — ABNORMAL HIGH (ref 0.44–1.00)
GFR, Estimated: 18 mL/min — ABNORMAL LOW (ref >60)
GFR, Estimated: 25 mL/min — ABNORMAL LOW (ref >60)
GFR, Estimated: 25 mL/min — ABNORMAL LOW (ref >60)
Glucose, Bld: 209 mg/dL — ABNORMAL HIGH (ref 70–99)
Glucose, Bld: 213 mg/dL — ABNORMAL HIGH (ref 70–99)
Glucose, Bld: 287 mg/dL — ABNORMAL HIGH (ref 70–99)
Potassium: 4.5 mmol/L (ref 3.5–5.1)
Potassium: 4 mmol/L (ref 3.5–5.1)
Potassium: 3.6 mmol/L (ref 3.5–5.1)
Sodium: 128 mmol/L — ABNORMAL LOW (ref 135–145)
Sodium: 133 mmol/L — ABNORMAL LOW (ref 135–145)
Sodium: 134 mmol/L — ABNORMAL LOW (ref 135–145)

## 2020-11-29 LAB — HEPATIC FUNCTION PANEL
ALT: 1657 U/L — ABNORMAL HIGH (ref 0–44)
AST: 1628 U/L — ABNORMAL HIGH (ref 15–41)
Albumin: 2.9 g/dL — ABNORMAL LOW (ref 3.5–5.0)
Alkaline Phosphatase: 121 U/L (ref 38–126)
Bilirubin, Direct: 1.6 mg/dL — ABNORMAL HIGH (ref 0.0–0.2)
Indirect Bilirubin: 0.9 mg/dL (ref 0.3–0.9)
Total Bilirubin: 2.5 mg/dL — ABNORMAL HIGH (ref 0.3–1.2)
Total Protein: 5.7 g/dL — ABNORMAL LOW (ref 6.5–8.1)

## 2020-11-29 LAB — COOXEMETRY PANEL
Carboxyhemoglobin: 1 % (ref 0.5–1.5)
Carboxyhemoglobin: 1.2 % (ref 0.5–1.5)
Methemoglobin: 1.4 % (ref 0.0–1.5)
Methemoglobin: 1 % (ref 0.0–1.5)
O2 Saturation: 47.9 %
O2 Saturation: 74.7 %
Total hemoglobin: 11 g/dL — ABNORMAL LOW (ref 12.0–16.0)
Total hemoglobin: 17.6 g/dL — ABNORMAL HIGH (ref 12.0–16.0)

## 2020-11-29 LAB — GLUCOSE, CAPILLARY
Glucose-Capillary: 197 mg/dL — ABNORMAL HIGH (ref 70–99)
Glucose-Capillary: 200 mg/dL — ABNORMAL HIGH (ref 70–99)
Glucose-Capillary: 200 mg/dL — ABNORMAL HIGH (ref 70–99)
Glucose-Capillary: 201 mg/dL — ABNORMAL HIGH (ref 70–99)
Glucose-Capillary: 203 mg/dL — ABNORMAL HIGH (ref 70–99)
Glucose-Capillary: 204 mg/dL — ABNORMAL HIGH (ref 70–99)
Glucose-Capillary: 212 mg/dL — ABNORMAL HIGH (ref 70–99)
Glucose-Capillary: 214 mg/dL — ABNORMAL HIGH (ref 70–99)
Glucose-Capillary: 217 mg/dL — ABNORMAL HIGH (ref 70–99)
Glucose-Capillary: 225 mg/dL — ABNORMAL HIGH (ref 70–99)
Glucose-Capillary: 250 mg/dL — ABNORMAL HIGH (ref 70–99)
Glucose-Capillary: 277 mg/dL — ABNORMAL HIGH (ref 70–99)
Glucose-Capillary: 284 mg/dL — ABNORMAL HIGH (ref 70–99)

## 2020-11-29 LAB — OSMOLALITY: Osmolality: 301 mOsm/kg — ABNORMAL HIGH (ref 275–295)

## 2020-11-29 LAB — HEPATITIS PANEL, ACUTE
HCV Ab: NONREACTIVE
Hep A IgM: NONREACTIVE
Hep B C IgM: NONREACTIVE
Hepatitis B Surface Ag: NONREACTIVE

## 2020-11-29 LAB — MAGNESIUM: Magnesium: 2.4 mg/dL (ref 1.7–2.4)

## 2020-11-29 LAB — TROPONIN I (HIGH SENSITIVITY): Troponin I (High Sensitivity): 29 ng/L — ABNORMAL HIGH (ref <18)

## 2020-11-29 LAB — TRIGLYCERIDES: Triglycerides: 789 mg/dL — ABNORMAL HIGH (ref <150)

## 2020-11-29 MED ORDER — MILRINONE LACTATE IN DEXTROSE 20-5 MG/100ML-% IV SOLN
0.1250 ug/kg/min | INTRAVENOUS | Status: DC
  Administered 2020-11-29 – 2020-12-01 (×3): 0.25 ug/kg/min via INTRAVENOUS
  Filled 2020-11-29 (×3): qty 100

## 2020-11-29 MED ORDER — INSULIN (MYXREDLIN) INFUSION FOR HYPERTRIGLYCERIDEMIA
4.0000 [IU]/h | INTRAVENOUS | Status: AC
  Administered 2020-11-29 (×2): 3.5 [IU]/h via INTRAVENOUS
  Administered 2020-11-30: 4.5 [IU]/h via INTRAVENOUS
  Administered 2020-12-01: 4 [IU]/h via INTRAVENOUS
  Filled 2020-11-29 (×4): qty 100

## 2020-11-29 MED ORDER — FUROSEMIDE 10 MG/ML IJ SOLN
40.0000 mg | Freq: Once | INTRAMUSCULAR | Status: AC
  Administered 2020-11-29: 40 mg via INTRAVENOUS
  Filled 2020-11-29: qty 4

## 2020-11-29 NOTE — Progress Notes (Signed)
Attempted [facility], wire would not cross midline so switched to R.  Central Venous Catheter Insertion Procedure Note  Jennifer [name]  [id number]  Feb 09, 1991  Date:11/29/20  Time:9:55 AM   Provider Performing:Aneli Zara Loletha Grayer Tamala Julian   Procedure: Insertion of Non-tunneled Central Venous [phone]) with US guidance (20802)   Indication(s) Medication administration  Consent Risks of the procedure as well as the alternatives and risks of each were explained to the patient and/or caregiver.  Consent for the procedure was obtained and is signed in the bedside chart  Anesthesia Topical only with 1% lidocaine   Timeout Verified patient identification, verified procedure, site/side was marked, verified correct patient position, special equipment/implants available, medications/allergies/relevant history reviewed, required imaging and test results available.  Sterile Technique Maximal sterile technique including full sterile barrier drape, hand hygiene, sterile gown, sterile gloves, mask, hair covering, sterile ultrasound probe cover (if used).  Procedure Description Area of catheter insertion was cleaned with chlorhexidine and draped in sterile fashion.  With real-time ultrasound guidance a central venous catheter was placed into the right internal jugular vein. Nonpulsatile blood flow and easy flushing noted in all ports.  The catheter was sutured in place and sterile dressing applied.  Complications/Tolerance None; patient tolerated the procedure well. Chest X-ray is ordered to verify placement for internal jugular or subclavian cannulation.   Chest x-ray is not ordered for femoral cannulation.  EBL Minimal  Specimen(s) None

## 2020-11-29 NOTE — Progress Notes (Signed)
[facility] Progress Note Patient Name: Jennifer [name] DOB: 01-09-1991 MRN: [id number]   Date of Service  11/29/2020  HPI/Events of Note  Pt on an insulin infusion for management of hyperTG.  Fingerstick 280's  eICU Interventions  Diet changed to carb-control.  Goal glucose level is 140 to 180 or so.  Will wait for new fingerstick; if coming down, then no other change needed     Intervention Category Major Interventions: Hyperglycemia - active titration of insulin therapy  Tilden Dome 11/29/2020, 10:49 PM

## 2020-11-29 NOTE — Progress Notes (Signed)
Responded to call for help from patient. Found patient laying supine in the floor. Stated she was reaching for her tray. Multiple staff and MD assisted. Primary MD notified. Test ordered.   Was the fall witnessed: No  Patient condition before and after the fall: Alert/Alert   Name of the doctor that was notified including date and time: [name] MD  Any interventions and vital signs: tests ordered VS stable. Primary RN to monitor.   Kathleene Hazel RN

## 2020-11-29 NOTE — Consult Note (Signed)
[facility] Consultation Note  Referring Provider: [facility] Primary Care Physician:  Sue Lush, PA-C Primary Gastroenterologist:  GI practice in [place] (name?)  Reason for Consultation:  elevated LFTs  HPI: Jennifer [name] is a 30 y.o. female with history of multiple medical problems including CHF (current EF now ~ 20%), hypertriglyceridemia with history recurrent pancreatitis, history astrocytoma.  We were asked to see for elevated LFTs.  She has history of intermittent upper abdominal pain; has been attributed to (chronic?) pancreatits from elevated TG; she sees GI MD in [place] who has done EGD and other evaluations and put patient on pancreatic enzyme (Creon?) supplement.  Patient in cardiac unit for cardiogenic shock and acute renal failure.   Past Medical History:  Diagnosis Date   Astrocytoma [facility])    Brain tumor ([place]) 03/29/1995   Cholesterosis    Diabetes mellitus without complication ([place])    DM (diabetes mellitus) ([place]) 10/10/2018   Fatty liver    HTN (hypertension) 10/10/2018   Hypertension    Hypothyroidism 10/10/2018   Hypothyroidism    Lipoprotein deficiency    Lung disease    longevity long term   Pancreatitis    Polycystic ovary syndrome    Thyroid disease     Past Surgical History:  Procedure Laterality Date   ABDOMINAL SURGERY     pt states during miscarriage got her intestine   BRAIN SURGERY     EYE MUSCLE SURGERY Right 03/28/2014   VENTRICULOSTOMY  03/28/1997    Prior to Admission medications   Medication Sig Start Date End Date Taking? Authorizing Provider  Acetylcarnitine HCl (ACETYL L-CARNITINE) 500 MG CAPS Take 500 mg by mouth daily.   Yes [provider]  albuterol (VENTOLIN HFA) 108 (90 Base) MCG/ACT inhaler Inhale 2 puffs into the lungs as needed for wheezing or shortness of breath.  12/31/18  Yes [provider]  Coenzyme Q10 (COQ10 PO) Take 1 tablet by mouth daily.   Yes [provider]  CRANBERRY FRUIT  PO Take 1 capsule by mouth 2 (two) times a day.   Yes [provider]  Cyanocobalamin (VITAMIN B12 PO) Take 1 Dose by mouth daily.   Yes [provider]  cyclobenzaprine (FLEXERIL) 10 MG tablet Take 10 mg by mouth as needed. for muscle spams 11/19/18  Yes [provider]  dicyclomine (BENTYL) 10 MG capsule Take 10 mg by mouth 3 (three) times daily as needed for spasms.  02/11/19  Yes [provider]  DULoxetine (CYMBALTA) 30 MG capsule Take 60 mg by mouth daily.  01/28/19 11/23/20 Yes [provider]  famotidine (PEPCID) 20 MG tablet Take 20 mg by mouth 2 (two) times daily.  02/11/19  Yes [provider]  fenofibrate (TRICOR) 145 MG tablet Take 145 mg by mouth daily. 01/14/19  Yes [provider]  fluticasone furoate-vilanterol (BREO ELLIPTA) 100-25 MCG/INH AEPB Inhale 1 puff into the lungs as needed (shortness of breath).  01/21/19  Yes [provider]  furosemide (LASIX) 20 MG tablet Take 1 tablet (20 mg total) by mouth daily. 10/07/20  Yes Deno Etienne, DO  ibuprofen (ADVIL) 600 MG tablet Take 1 tablet (600 mg total) by mouth every 6 (six) hours as needed. 10/19/18  Yes Law, Bea Graff, PA-C  Insulin Glargine, 1 Unit Dial, (TOUJEO SOLOSTAR) 300 UNIT/ML SOPN Inject 94 Units into the skin in the morning and at bedtime.   Yes [provider]  levothyroxine (SYNTHROID) 150 MCG tablet Take 150 mcg by mouth daily. 01/14/19  Yes [provider]  metoprolol (TOPROL-XL) 200 MG 24 hr tablet Take 200 mg by mouth daily.   Yes [provider]  metoprolol succinate (TOPROL-XL) 50 MG 24 hr tablet Take 50 mg by mouth daily. Take along w/ 237m Daily   Yes [provider]  norethindrone-ethinyl estradiol (OVCON-35) 0.4-35 MG-MCG tablet Take 1 tablet by mouth daily.   Yes [provider]  NOVOLOG FLEXPEN 100 UNIT/ML FlexPen Inject 60 Units into the skin 3 (three) times daily. Every meal 04/19/19  Yes  [provider]  ondansetron (ZOFRAN) 4 MG tablet Take 4 mg by mouth as needed for nausea or vomiting.  12/19/18  Yes [provider]  pantoprazole (PROTONIX) 40 MG tablet Take 40 mg by mouth daily. 12/24/19  Yes [provider]  pregabalin (LYRICA) 300 MG capsule Take 300 mg by mouth 2 (two) times daily.    Yes [provider]  prochlorperazine (COMPAZINE) 10 MG tablet Take 1 tablet (10 mg total) by mouth 2 (two) times daily as needed for nausea or vomiting. 04/18/20  Yes RPattricia Boss MD  rosuvastatin (CRESTOR) 40 MG tablet Take 40 mg by mouth daily.   Yes [provider]  sacubitril-valsartan (ENTRESTO) 24-26 MG Take 1 tablet by mouth 2 (two) times daily.   Yes [provider]  ALPHA LIPOIC ACID-BIOTIN PO Take 200 mg by mouth daily.  Patient not taking: Reported on 11/23/2020    [provider]  ENTRESTO 97-103 MG TAKE ONE TABLET BY MOUTH TWICE A DAY Patient not taking: Reported on 11/23/2020 08/10/20   GAdrian Prows MD  HYDROcodone-acetaminophen (NORCO/VICODIN) 5-325 MG tablet Take 1 tablet by mouth every 6 (six) hours as needed. Patient not taking: Reported on 11/23/2020 05/10/19   WRipley Fraise MD  metoprolol tartrate (LOPRESSOR) 100 MG tablet TAKE ONE TABLET BY MOUTH TWICE A DAY Patient not taking: Reported on 11/23/2020 08/10/20   GAdrian Prows MD  nitroGLYCERIN (NITROSTAT) 0.4 MG SL tablet Place 1 tablet (0.4 mg total) under the tongue every 5 (five) minutes as needed for up to 25 days for chest pain. 08/01/19 08/26/19  GAdrian Prows MD    Current Facility-Administered Medications  Medication Dose Route Frequency Provider Last Rate Last Admin   0.9 %  sodium chloride infusion  250 mL Intravenous PRN GDwyane Dee MD       0.9 %  sodium chloride infusion  250 mL Intravenous Continuous Tolia, Sunit, DO       Chlorhexidine Gluconate Cloth 2 % PADS 6 each  6 each Topical Daily BLovey NewcomerT, NP   6 each at 11/28/20 0849   dextrose 5  %-0.9 % sodium chloride infusion   Intravenous Continuous GDwyane Dee MD   Held at 11/29/20 0150   diphenhydrAMINE (BENADRYL) capsule 25 mg  25 mg Oral Q6H PRN GDwyane Dee MD       DOBUTamine (DOBUTREX) infusion 4000 mcg/mL  2.5 mcg/kg/min Intravenous Titrated GDwyane Dee MD 2.21 mL/hr at 11/29/20 0600 2.5 mcg/kg/min at 11/29/20 0600   DULoxetine (CYMBALTA) DR capsule 60 mg  60 mg Oral Daily GDwyane Dee MD   60 mg at 11/29/20 0746   famotidine (PEPCID) tablet 10 mg  10 mg Oral Daily GDwyane Dee MD   10 mg at 11/29/20 0745   fenofibrate tablet 160 mg  160 mg Oral Daily GDwyane Dee MD   160 mg at 11/29/20 0746   heparin injection 5,000 Units  5,000 Units Subcutaneous Q8H GDwyane Dee MD   5,000 Units  at 11/29/20 1321   HYDROmorphone (DILAUDID) injection 0.25 mg  0.25 mg Intravenous Q3H PRN Dwyane Dee, MD   0.25 mg at 11/29/20 1315   insulin (MYXREDLIN) 100 units/100 mL infusion for hypertriglyceridemia-induced pancreatitis  3.5 Units/hr Intravenous Continuous Candee Furbish, MD 3.5 mL/hr at 11/29/20 0918 3.5 Units/hr at 11/29/20 0918   lactulose (CHRONULAC) 10 GM/15ML solution 20 g  20 g Oral TID Dwyane Dee, MD   20 g at 11/29/20 0759   levothyroxine (SYNTHROID) tablet 125 mcg  125 mcg Oral Q0600 Dwyane Dee, MD   125 mcg at 11/29/20 0511   lidocaine (XYLOCAINE) 2 % viscous mouth solution 15 mL  15 mL Oral QID Dwyane Dee, MD   15 mL at 11/28/20 1415   milrinone (PRIMACOR) 20 MG/100 ML (0.2 mg/mL) infusion  0.25 mcg/kg/min Intravenous Continuous [name], Shaune Pascal, MD 4.43 mL/hr at 11/29/20 1321 0.25 mcg/kg/min at 11/29/20 1321   norepinephrine (LEVOPHED) 53m in 2530mpremix infusion  2-10 mcg/min Intravenous Titrated Tolia, Sunit, DO       norethindrone-ethinyl estradiol (OVCON-35) tablet 1 tablet  1 tablet Oral Daily GiDwyane DeeMD   1 tablet at 11/23/20 0920   ondansetron (ZOFRAN) injection 4 mg  4 mg Intravenous Q6H PRN GiDwyane DeeMD       phenol  (CHLORASEPTIC) mouth spray 1 spray  1 spray Mouth/Throat PRN GiDwyane DeeMD   1 spray at 11/26/20 [id number] prochlorperazine (COMPAZINE) injection 10 mg  10 mg Intravenous Q6H PRN GiDwyane DeeMD       senna-docusate (Senokot-S) tablet 1 tablet  1 tablet Oral QHS PRN GiDwyane DeeMD       sodium bicarbonate tablet 650 mg  650 mg Oral TID GiDwyane DeeMD   650 mg at 11/29/20 0747   sodium chloride flush (NS) 0.9 % injection 10-40 mL  10-40 mL Intracatheter Q12H GoCollier BullockMD   10 mL at 11/29/20 1300   sodium chloride flush (NS) 0.9 % injection 10-40 mL  10-40 mL Intracatheter PRN GoCollier BullockMD       sodium chloride flush (NS) 0.9 % injection 3 mL  3 mL Intravenous Q12H GiDwyane DeeMD   3 mL at 11/29/20 1301   sodium chloride flush (NS) 0.9 % injection 3 mL  3 mL Intravenous PRN GiDwyane DeeMD        Allergies as of 11/22/2020 - Review Complete 11/22/2020  Allergen Reaction Noted   Ketamine  11/22/2020   Clarithromycin Other (See Comments) 07/29/2018   Shellfish allergy Swelling 11/25/2017   Penicillins Rash 11/25/2017   Prednisone Rash 03/26/2014    Family History  Problem Relation Age of Onset   Diabetes Mother    Hypertension Mother    Hyperlipidemia Mother    Thyroid disease Mother    Hypertension Father    Diabetes Father     Social History   Socioeconomic History   Marital status: Married    Spouse name: Not on file   Number of children: 0   Years of education: Not on file   Highest education level: Not on file  Occupational History   Not on file  Tobacco Use   Smoking status: Never   Smokeless tobacco: Never  Vaping Use   Vaping Use: Never used  Substance and Sexual Activity   Alcohol use: Never   Drug use: Never   Sexual activity: Not on file  Other Topics Concern   Not on file  Social History Narrative  Not on file   Social Determinants of Health   Financial Resource Strain: Not on file  Food Insecurity: Not on file   Transportation Needs: Not on file  Physical Activity: Not on file  Stress: Not on file  Social Connections: Not on file  Intimate Partner Violence: Not on file    Review of Systems: As per HPI, all others negative  Physical Exam: Vital signs in last 24 hours: Temp:  [97.8 F (36.6 C)-98.2 F (36.8 C)] 98.2 F (36.8 C) (09/03 2200) Pulse Rate:  [78-109] 91 (09/04 1317) Resp:  [16-33] 24 (09/04 1317) BP: (101-158)/(60-108) 119/77 (09/04 1317) SpO2:  [93 %-100 %] 100 % (09/04 1317) Last BM Date: 11/29/20 General:   Thin, somewhat chronically ill-appearing, NAD Head:  Normocephalic and atraumatic. Eyes:  Sclera clear, no icterus.   Conjunctiva pink. Ears:  Normal auditory acuity. Nose:  No deformity, discharge,  or lesions. Mouth:  No deformity or lesions.  Oropharynx pink & moist. Neck:  Supple; no masses or thyromegaly. Abdomen:  Soft, nontender and nondistended. No masses, hepatosplenomegaly or hernias noted. Normal bowel sounds, without guarding, and without rebound.     Msk:  Symmetrical without gross deformities. Normal posture. Pulses:  Normal pulses noted. Extremities:  Trace LE bilateral edema Neurologic:  Alert and  oriented x4;  grossly normal neurologically. Skin:  Intact without significant lesions or rashes. Psych:  Alert and cooperative. Normal mood and affect.   Lab Results: Recent Labs    11/28/20 0218 11/28/20 2256 11/29/20 0101  WBC 9.8 11.5* 11.0*  HGB 10.2* 9.3* 9.6*  HCT 32.2* 28.7* 29.6*  PLT 119* 91* 95*   BMET Recent Labs    11/28/20 1511 11/28/20 1816 11/29/20 0614  NA 129* 130* 131*  K 4.8 4.8 4.4  CL 103 103 103  CO2 13* 14* 16*  GLUCOSE 102* 163* 237*  BUN 78* 74* 64*  CREATININE 3.73* 3.01* 3.11*  CALCIUM 7.9* 7.9* 7.8*   LFT Recent Labs    11/28/20 1511 11/28/20 1816 11/29/20 0614  PROT 6.6   < > 6.0*  ALBUMIN 3.5   < > 2.9*  AST 2,837*   < > 1,252*  ALT 2,075*   < > 1,671*  ALKPHOS 109   < > 124  BILITOT 2.6*   < >  2.6*  BILIDIR 1.7*  --   --   IBILI 0.9  --   --    < > = values in this interval not displayed.   PT/INR Recent Labs    11/28/20 1817  LABPROT 17.9*  INR 1.5*    Studies/Results: U/S steatosis, gallbladder wall thickening, no biliary ductal dilatation  Impression:   Elevated LFTs.  Acutely, almost assuredly from acute decompensated heart failure and secondary shock liver +/- hepatic passive congestion.  Likely also has some chronic elevated LFTs from her steatosis (from markedly elevated TG). Abnormal U/S:  gallbladder wall thickening.  Likely reactive from elevated LFTs.  Plan:   I do not think patient has acute gallbladder process.  Would follow LFTs (already improving) and INR. Do not feel need for further intrinsic hepatic work-up at this point, so long as LFTs continue to down-trend and in absence of any new/interval symptoms. Would recommend patient follow-up with her outpatient GI MD in 4-6 weeks for recheck and her PCP in 2-3 weeks for LFT recheck. No further recommendations at this time. [facility] will sign-off; please call with questions; thank you for the consultation.   LOS: 6 days  Landry Dyke  11/29/2020, 1:32 PM  Cell [phone] If no answer or after 5 PM call [phone]

## 2020-11-29 NOTE — Progress Notes (Signed)
Progress Note  Patient Name: Jennifer [name] Date of Encounter: 11/29/2020  Attending physician: Candee Furbish, MD Primary care provider: Sue Lush, PA-C Primary Cardiologist: Dr. Einar Gip Consultant:Deaunna Olarte Terri Skains, DO  Subjective: Jennifer [name] is a 30 y.o. female who was seen and examined at bedside  Denies chest pain. Shortness of breath mildly improved compared to yesterday. Since last evaluation patient is now on dobutamine, has a central line in place, blood pressures more stable. Prior to seeing her during morning rounds patient was sitting at the side of the bed wanting to adjust herself and slid down to the floor she was lying on the floor being assisted by medical personnel as I walked into the room. Case discussed and reviewed with her nurse.  Objective: Vital Signs in the last 24 hours: Temp:  [97.8 F (36.6 C)-98.2 F (36.8 C)] 98.2 F (36.8 C) (09/03 2200) Pulse Rate:  [39-109] 88 (09/04 0745) Resp:  [16-33] 23 (09/04 0745) BP: (101-158)/(60-108) 131/81 (09/04 0730) SpO2:  [93 %-100 %] 100 % (09/04 0745)  Intake/Output:  Intake/Output Summary (Last 24 hours) at 11/29/2020 1139 Last data filed at 11/29/2020 0918 Gross per 24 hour  Intake 1112.66 ml  Output 2500 ml  Net -1387.34 ml    Net IO Since Admission: 1,842.48 mL [11/29/20 1139]  Weights:  Filed Weights   11/23/20 0355 11/27/20 2150  Weight: 55.8 kg 59 kg    Telemetry: Personally reviewed - sinus tachycardia   Physical examination: PHYSICAL EXAM: Vitals with BMI 11/29/2020 11/29/2020 11/29/2020  Height - - -  Weight - - -  BMI - - -  Systolic - 657 846  Diastolic - 81 81  Pulse 88 89 85   CONSTITUTIONAL: Appears older than stated age, well-developed and well-nourished. No acute distress.  Hemodynamically stable. SKIN: Skin is warm and dry. No rash noted. No cyanosis. No pallor. No jaundice HEAD: Normocephalic and atraumatic.  EYES: No scleral icterus MOUTH/THROAT: Moist oral membranes.   NECK: JVP at the jaw. No thyromegaly noted. No carotid bruits  LYMPHATIC: No visible cervical adenopathy.  CHEST Normal respiratory effort. No intercostal retractions  LUNGS: Clear to auscultation in the upper lung fields with crackles noted at the bases, no stridor. No wheezes.  CARDIOVASCULAR: Regular, positive N6-E9, soft systolic murmur heard over the left lower sternal border, no gallops or rubs. ABDOMINAL: Soft, distended, nontender, decreased bowel sounds in all 4 quadrants, EXTREMITIES: Right lower extremity more swollen compared to left (chronic per patient), +1 edema bilaterally  HEMATOLOGIC: No significant bruising NEUROLOGIC: Oriented to person, place, and time.  Right proptosis.  Left-sided weakness  PSYCHIATRIC: Normal mood and affect. Normal behavior. Cooperative  Lab Results: Hematology Recent Labs  Lab 11/28/20 0218 11/28/20 2256 11/29/20 0101  WBC 9.8 11.5* 11.0*  RBC 3.62* 3.30* 3.39*  HGB 10.2* 9.3* 9.6*  HCT 32.2* 28.7* 29.6*  MCV 89.0 87.0 87.3  MCH 28.2 28.2 28.3  MCHC 31.7 32.4 32.4  RDW 16.4* 16.4* 16.5*  PLT 119* 91* 95*    Chemistry Recent Labs  Lab 11/28/20 1511 11/28/20 1816 11/29/20 0614  NA 129* 130* 131*  K 4.8 4.8 4.4  CL 103 103 103  CO2 13* 14* 16*  GLUCOSE 102* 163* 237*  BUN 78* 74* 64*  CREATININE 3.73* 3.01* 3.11*  CALCIUM 7.9* 7.9* 7.8*  PROT 6.6 6.5 6.0*  ALBUMIN 3.5 3.3* 2.9*  AST 2,837* 2,519* 1,252*  ALT 2,075* 1,914* 1,671*  ALKPHOS 109 105 124  BILITOT 2.6* 2.5* 2.6*  GFRNONAA  16* 21* 20*  ANIONGAP 13 13 12      Cardiac Enzymes: Cardiac Panel (last 3 results) Recent Labs    11/29/20 0614  TROPONINIHS 29*    BNP (last 3 results) Recent Labs    11/23/20 0224 11/28/20 1511  BNP 48.9 529.6*    ProBNP (last 3 results) No results for input(s): PROBNP in the last 8760 hours.   DDimer No results for input(s): DDIMER in the last 168 hours.   Hemoglobin A1c:  Lab Results  Component Value Date   HGBA1C  7.8 (H) 11/23/2020   MPG 177.16 11/23/2020    TSH  Recent Labs    11/28/20 1511  TSH 0.098*    Lipid Panel     Component Value Date/Time   CHOL 486 (H) 11/29/2020 0614   TRIG 908 (H) 11/29/2020 0614   HDL 22 (L) 11/29/2020 0614   CHOLHDL 22.1 11/29/2020 0614   VLDL UNABLE TO CALCULATE IF TRIGLYCERIDE OVER 400 mg/dL 11/29/2020 0614   LDLCALC UNABLE TO CALCULATE IF TRIGLYCERIDE OVER 400 mg/dL 11/29/2020 0614   LDLDIRECT 134.0 (H) 11/28/2020 2256    Imaging: US Abdomen Limited  Result Date: 11/28/2020 CLINICAL DATA:  Elevated LFTs. EXAM: ULTRASOUND ABDOMEN LIMITED RIGHT UPPER QUADRANT COMPARISON:  Ultrasound 04/18/2020. Abdomen pelvis CT 11/22/2020, CT 11/24/2020 FINDINGS: Gallbladder: Partially distended. Borderline wall thickening of 3 mm. Small volume pericholecystic fluid. No gallstones. Positive sonographic Murphy sign noted by sonographer. Common bile duct: Diameter: 5-6 mm. Liver: Heterogeneous and diffusely increased in parenchymal echogenicity. The liver parenchyma is difficult to penetrate. Less echogenic parenchyma adjacent to the gallbladder fossa may be related to edema or focal fatty sparing. No capsular nodularity. Portal vein is patent on color Doppler imaging with normal direction of blood flow towards the liver. Other: Trace free fluid adjacent to the liver. IMPRESSION: 1. Heterogeneous increased hepatic echogenicity consistent with steatosis. 2. No gallstones, however there is equivocal gallbladder wall thickening with small amount of pericholecystic fluid and a positive sonographic Murphy sign reported by the sonographer. This may be reactive due to liver disease, however possibility of acalculous cholecystitis is raised. As clinically indicated, consider nuclear medicine hepatobiliary scan. 3. No biliary dilatation. Electronically Signed   By: Keith Rake M.D.   On: 11/28/2020 23:47   ECHOCARDIOGRAM COMPLETE  Result Date: 11/28/2020    ECHOCARDIOGRAM REPORT    Patient Name:   Jennifer [name] Date of Exam: 11/28/2020 Medical Rec #:  [id number]         Height:       57.0 in Accession #:    [id number]        Weight:       130.1 lb Date of Birth:  20-Feb-1991         BSA:          1.498 m Patient Age:    30 years          BP:           112/81 mmHg Patient Gender: F                 HR:           80 bpm. Exam Location:  Inpatient Procedure: 2D Echo, Cardiac Doppler and Color Doppler Indications:    CHF  History:        Patient has prior history of Echocardiogram examinations, most                 recent 04/15/2019.  Sonographer:  TAMARA CROWN RDCS Referring Phys: [place]  1. Left ventricular ejection fraction, by estimation, is 25 to 30%. The left ventricle has severely decreased function. The left ventricle demonstrates global hypokinesis. Left ventricular diastolic parameters are consistent with Grade II diastolic dysfunction (pseudonormalization). Elevated left atrial pressure. There is the interventricular septum is flattened in diastole ('D' shaped left ventricle), consistent with right ventricular volume overload.  2. Right ventricular systolic function is mildly reduced. The right ventricular size is normal. There is mildly elevated pulmonary artery systolic pressure.  3. The mitral valve is normal in structure. Mild to moderate mitral valve regurgitation.  4. Tricuspid valve regurgitation is moderate to severe.  5. The aortic valve is normal in structure. Aortic valve regurgitation is mild. FINDINGS  Left Ventricle: Left ventricular ejection fraction, by estimation, is 25 to 30%. The left ventricle has severely decreased function. The left ventricle demonstrates global hypokinesis. The left ventricular internal cavity size was normal in size. There is no left ventricular hypertrophy. The interventricular septum is flattened in diastole ('D' shaped left ventricle), consistent with right ventricular volume overload. Left ventricular diastolic  parameters are consistent with Grade II diastolic dysfunction (pseudonormalization). Elevated left atrial pressure. Right Ventricle: The right ventricular size is normal. No increase in right ventricular wall thickness. Right ventricular systolic function is mildly reduced. There is mildly elevated pulmonary artery systolic pressure. The tricuspid regurgitant velocity  is 2.96 m/s, and with an assumed right atrial pressure of 3 mmHg, the estimated right ventricular systolic pressure is 79.8 mmHg. Left Atrium: Left atrial size was normal in size. Right Atrium: Right atrial size was normal in size. Pericardium: There is no evidence of pericardial effusion. Mitral Valve: The mitral valve is normal in structure. Mild to moderate mitral valve regurgitation, with centrally-directed jet. Tricuspid Valve: The tricuspid valve is normal in structure. Tricuspid valve regurgitation is moderate to severe. Aortic Valve: The aortic valve is normal in structure. Aortic valve regurgitation is mild. Aortic regurgitation PHT measures 367 msec. Aortic valve mean gradient measures 3.0 mmHg. Aortic valve peak gradient measures 4.8 mmHg. Aortic valve area, by VTI measures 1.93 cm. Pulmonic Valve: The pulmonic valve was normal in structure. Pulmonic valve regurgitation is trivial. Aorta: The aortic root was not well visualized. IAS/Shunts: No atrial level shunt detected by color flow Doppler.  LEFT VENTRICLE PLAX 2D LVIDd:         4.40 cm     Diastology LVIDs:         3.80 cm     LV e' medial:    5.11 cm/s LV PW:         0.90 cm     LV E/e' medial:  17.8 LV IVS:        1.00 cm     LV e' lateral:   9.03 cm/s LVOT diam:     1.80 cm     LV E/e' lateral: 10.1 LV SV:         37 LV SV Index:   25 LVOT Area:     2.54 cm  LV Volumes (MOD) LV vol d, MOD A4C: 70.3 ml LV vol s, MOD A4C: 51.3 ml LV SV MOD A4C:     70.3 ml RIGHT VENTRICLE RV S prime:     9.36 cm/s TAPSE (M-mode): 1.3 cm LEFT ATRIUM             Index       RIGHT ATRIUM  Index  LA diam:        3.30 cm 2.20 cm/m  RA Area:     11.20 cm LA Vol (A2C):   27.7 ml 18.49 ml/m RA Volume:   21.70 ml  14.49 ml/m LA Vol (A4C):   22.8 ml 15.22 ml/m LA Biplane Vol: 25.3 ml 16.89 ml/m  AORTIC VALVE                   PULMONIC VALVE AV Area (Vmax):    2.18 cm    PV Vmax:          0.84 m/s AV Area (Vmean):   2.04 cm    PV Vmean:         57.000 cm/s AV Area (VTI):     1.93 cm    PV VTI:           0.140 m AV Vmax:           109.00 cm/s PV Peak grad:     2.8 mmHg AV Vmean:          73.900 cm/s PV Mean grad:     2.0 mmHg AV VTI:            0.191 m     PR End Diast Vel: 2.92 msec AV Peak Grad:      4.8 mmHg AV Mean Grad:      3.0 mmHg LVOT Vmax:         93.50 cm/s LVOT Vmean:        59.100 cm/s LVOT VTI:          0.145 m LVOT/AV VTI ratio: 0.76 AI PHT:            367 msec AR Vena Contracta: 0.20 cm  AORTA Ao Root diam: 2.20 cm Ao Asc diam:  2.50 cm MITRAL VALVE                TRICUSPID VALVE MV Area (PHT): 6.27 cm     TR Peak grad:   35.0 mmHg MV Decel Time: 121 msec     TR Vmax:        296.00 cm/s MR Vena Contracta: 0.30 cm MR PISA:           1.01 cm SHUNTS MR PISA Radius:    0.40 cm  Systemic VTI:  0.14 m MV E velocity: 90.80 cm/s   Systemic Diam: 1.80 cm MV A velocity: 32.30 cm/s MV E/A ratio:  2.81 Mihai Croitoru MD Electronically signed by Sanda Klein MD Signature Date/Time: 11/28/2020/1:16:35 PM    Final    [place] EKG SITE RITE  Result Date: 11/28/2020 If Site Rite image not attached, placement could not be confirmed due to current cardiac rhythm.   Cardiac database: EKG: 11/14/2020: Sinus tachycardia, 107 bpm, poor R wave progression, without underlying injury pattern.Compared to prior ECG 10/07/2020 ventricular rate has improved.   Echocardiogram: 11/28/2020:  1. Left ventricular ejection fraction, by estimation, is 25 to 30%. The left ventricle has severely decreased function. The left ventricle demonstrates global hypokinesis. Left ventricular diastolic parameters are  consistent with  Grade II diastolic dysfunction (pseudonormalization). Elevated left atrial pressure. There is the interventricular septum is flattened in diastole ('D' shaped left ventricle), consistent with right ventricular volume overload.   2. Right ventricular systolic function is mildly reduced. The right ventricular size is normal. There is mildly elevated pulmonary artery systolic pressure.   3. The mitral valve is normal in structure. Mild to moderate mitral  valve regurgitation.   4. Tricuspid valve regurgitation is moderate to severe.   5. The aortic valve is normal in structure. Aortic valve regurgitation is  mild.    Stress Testing:  Lexiscan (Walking with mod Bruce) Sestamibi Stress Test 05/08/2019: Nondiagnostic ECG stress. Myocardial perfusion is normal. LV is normal in size both in rest and stress images. Stress LV EF:  calculated at 38% but visually normal. No previous exam available for comparison. Low risk study.   Scheduled Meds:  Chlorhexidine Gluconate Cloth  6 each Topical Daily   DULoxetine  60 mg Oral Daily   famotidine  10 mg Oral Daily   fenofibrate  160 mg Oral Daily   furosemide  40 mg Intravenous Once   heparin injection (subcutaneous)  5,000 Units Subcutaneous Q8H   lactulose  20 g Oral TID   levothyroxine  125 mcg Oral Q0600   lidocaine  15 mL Oral QID   norethindrone-ethinyl estradiol  1 tablet Oral Daily   sodium bicarbonate  650 mg Oral TID   sodium chloride flush  10-40 mL Intracatheter Q12H   sodium chloride flush  3 mL Intravenous Q12H    Continuous Infusions:  sodium chloride     sodium chloride     dextrose 5 % and 0.9% NaCl Stopped (11/29/20 0150)   DOBUTamine 2.5 mcg/kg/min (11/29/20 0600)   insulin 3.5 Units/hr (11/29/20 0918)   milrinone     norepinephrine (LEVOPHED) Adult infusion      PRN Meds: sodium chloride, diphenhydrAMINE, HYDROmorphone (DILAUDID) injection, ondansetron (ZOFRAN) IV, phenol, prochlorperazine, senna-docusate, sodium chloride  flush, sodium chloride flush   IMPRESSION & RECOMMENDATIONS: Foy Mungia is a 30 y.o. 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  Acute on chronic systolic heart failure stage C, NYHA class III: Most likely secondary to nonischemic cardiomyopathy (history of chemotherapy and prior CMR findings of myocarditis back in 2019 per report). Echocardiogram 03/2020: LVEF 35-40% with moderate MR. Echocardiogram 11/28/2020: LVEF, RV dysfunction, moderate to severe TR. Currently on 2.5 mcg/kg/min of dobutamine.  Has not required norepinephrine as blood pressure remains stable Has established central axis of the right IJ. Blood pressures are better compared to yesterday. Co-ox lower than expected.  CVP 14 Advanced heart failure recommendations and follow-up greatly appreciated.  Recommending addition of milrinone and prescribed Lasix IV push.   Trend co-oximetry and CVP AST and ALT are improving. Serum creatinine appears to plateau.  Currently managed by neurology. Serum sodium levels improving compared to earlier this admission Cardiac MRI tentatively planned for Tuesday. Will consider right heart catheterization in the coming days. Troponins not suggestive of myocarditis. Currently holding Entresto/ACE inhibitor/ARB due to acute kidney injury and beta-blocker secondary to shock physiology. Plan of care discussed with advanced heart failure specialist Dr. Haroldine Laws during morning rounds.  Nonischemic cardiomyopathy: See above  Acute kidney injury: Most likely secondary to ischemic ATN, polypharmacy, recent contrast exposure, cardiorenal physiology. Positive fluid balance since admission, I's and O's  documented. Avoid nephrotoxic agents. Serum creatinine appears to be plateauing Monitor BUN and creatinine since the patient has been started on inotrope support and gentle diuresis. Notes and daily weights.  Shock liver secondary to acute/chronic heart failure with reduced EF: AST and ALT improving Monitor for now.  Hypertriglyceridemia with lipoprotein lipase deficiency:  TG on arrival 4040 Has received insulin and dextrose during this hospitalization while at [facility] long. Currently on insulin drip.  Hypothyroidism: Currently on Synthroid. Most recent TSH possible suggestive of iatrogenic hyperthyroidism, will defer to CCM  Type 1 diabetes mellitus with hypertriglyceridemia and circulatory complication: Most recent hemoglobin A1c 7.8. Currently on insulin drip. Monitor serum glucose.  Patient's questions and concerns were addressed to her satisfaction. She voices understanding of the instructions provided during this encounter.   This note was created using a voice recognition software as a result there may be grammatical errors inadvertently enclosed that do not reflect the nature of this encounter. Every attempt is made to correct such errors.  CRITICAL CARE Performed by: Rex Kras   Total critical care time: 33 minutes   Critical care time was exclusive of separately billable procedures and treating other patients.   Critical care was necessary to treat or prevent imminent or life-threatening deterioration.   Critical care was time spent personally by me on the following activities: development of treatment plan with patient and/or surrogate as well as nursing, discussions with consultants, evaluation of patient's response to treatment, examination of patient, obtaining history from patient or surrogate, ordering and performing treatments and interventions, ordering and review of laboratory studies, ordering and review of radiographic studies, pulse oximetry and  re-evaluation of patient's condition.  Rex Kras, DO, [facility]. [place] Office: [phone] 11/29/2020, 11:39 AM

## 2020-11-29 NOTE — Progress Notes (Addendum)
Secure chat with Dr Justin Mend, nephrology re PICC placement.  States to have CVC placed to salvage the peripheral veins. [place] RN notified via phone call. Dr Tamala Julian notified via securechat.

## 2020-11-29 NOTE — Progress Notes (Addendum)
RN returned from transferring a patient and found patient lying supine on the floor. Staff had already responded and were assessing patient. Patient moved to bed and orders placed for lumbar/sacral xray. Patient accompanied by RN to xray and returned to unit. Floor mats in place, bed alarm on, fall risk bracelet in place.

## 2020-11-29 NOTE — Progress Notes (Signed)
Patient resting comfortably on room air. No respiratory distress noted. Bipap not needed at this time. RT will monitor as needed.

## 2020-11-29 NOTE — Progress Notes (Signed)
NAME:  Jennifer [name], MRN:  [id number], DOB:  1991-02-09, LOS: 6 ADMISSION DATE:  11/22/2020, CONSULTATION DATE:  9/3 REFERRING MD:  Sabino Gasser MD, Reason for consult:  decompensated heart failure   History of Present Illness:  Jennifer [name] is a  30 y.o. F who is seen in consultation at the request of Dr. Sabino Gasser for recommendations on further evaluation and management of decompensated heart failure. The patient presented to [facility] ED  on 8/28 via with complaints of abdominal pain and diarrhea. The patient was admitted to the [facility] hospitalist service.  She presented to the ED after two weeks of worsening abdominal pain and diarrhea. Her workup in the [facility] ED did not demonstrate pancreatitis on CT abdomen. There was some concern for vasculitis for which she was seen by vascular surgery with recommendations for outpatient follow up.   Her TG were found to be  4030 on 8/31. She has a history of LPL deficiency and follows with [facility] endocrinology with Dr. Garrel Ridgel. She was started on an insulin/dextrose infusion on 9/3. TG [phone]. Lipase was remained WNL  Cardiology and Nephrology were consulted on 9/3 after her creatinine continued to rise and she began developing hypotension. EF was found to be 25-30% on ECHO with elevate LAP and RV volume overload. Cardiology has recommended the patient be transferred to [facility] to be started on NE and dobutamine.   Pertinent  Medical History  Astrocytoma, DMII, NAFLD, LPL deficiency, HTN, hypothyroidis, PCOS, CHF, chronic pancreatitis  Significant Hospital Events: Including procedures, antibiotic start and stop dates in addition to other pertinent events   8/28 Admit, CT abd pelvis no pancreatitis, concern for vasculitis. Seen by VS. Needs outpatient follow up. 8/30 CTA abd pelvis: no evidence of mesenteric ischemia. Un remarkable pancreas 9/2 Cards and Renal consulted. ECHO. LVEF 25-30%. LV severely decreased function. Grade II diastolic dysfunction. RVSF mild  reduced. Moderate to severe TR. TXR requested to [facility].   Interim History / Subjective:  No events, abdominal pain persists.  Good urine output.  Objective   Blood pressure 131/81, pulse 88, temperature 98.2 F (36.8 C), temperature source Oral, resp. rate (!) 23, height 4' 9"  (1.448 m), weight 59 kg, last menstrual period 11/19/2020, SpO2 100 %. On RA        Intake/Output Summary (Last 24 hours) at 11/29/2020 0845 Last data filed at 11/29/2020 0600 Gross per 24 hour  Intake 1112.66 ml  Output 2150 ml  Net -1037.34 ml    Filed Weights   11/23/20 0355 11/27/20 2150  Weight: 55.8 kg 59 kg    Examination: Young woman in no distress Lungs clear, heart sounds regular Ext 1+ edema Abd diffusely TTP, +palpable hepatomegaly Mentating, Aox3, no asterixis  Shock liver noted Echo reviewed Cr improved  Resolved Hospital Problem list     Assessment & Plan:   Multifactorial acute liver injury (statin, CHF, pancreatitis) Similar multifactorial nonoliguric renal failure Suspected cardiogenic shock TG induced acute on chronic pancreatitis Hx astrocytoma post resection, mild residual L weakness HTN DM2 with hyperglycemia TSH low, T4 normal Hx unspecified abdominal surgery  - Continue high dose insulin euglycemic protocol > 0.05 units/kg/hr titrating to CBG 100-180, TG will come down with this - Inotrope and diuretic push per CHF and renal discussions - Trend liver/renal indices, avoid toxic meds - Central line, coox - Hopefully as approaches euvolemia and TG-associated abdominal inflammation eases up, all organs will return to equilibrium  Best Practice (right click and "Reselect all SmartList Selections" daily)   Diet/type:  Regular consistency (see orders) DVT prophylaxis: prophylactic heparin  GI prophylaxis: H2B Lines: N/A, plan to place central line. Foley:  N/A Code Status:  full code Last date of multidisciplinary goals of care discussion [n/a]   Patient critically  ill due to shock Interventions to address this today inotrope, insulin titration Risk of deterioration without these interventions is high  I personally spent 34 minutes providing critical care not including any separately billable procedures  Erskine Emery MD [facility]  Prefer epic messenger for cross cover needs If after hours, please call E-link

## 2020-11-29 NOTE — Progress Notes (Signed)
Advanced Heart Failure Rounding Note  PCP-Cardiologist: None   Subjective:    Remains on dobutamine at 2.5.  CVC placed this AM -- await CVP and co-oximetry measurements.  No weight this AM -- last weight recorded on 9/2 was 130 lbs.  Net I/O of -937 x24 hours with >2L of urine and 6 unmeasured occurrences charted.     Objective:   Weight Range: 59 kg Body mass index is 28.15 kg/m.   Vital Signs:   Temp:  [97.8 F (36.6 C)-98.2 F (36.8 C)] 98.2 F (36.8 C) (09/03 2200) Pulse Rate:  [39-109] 88 (09/04 0745) Resp:  [16-33] 23 (09/04 0745) BP: (89-158)/(37-108) 131/81 (09/04 0730) SpO2:  [92 %-100 %] 100 % (09/04 0745) Last BM Date: 11/29/20  Weight change: Filed Weights   11/23/20 0355 11/27/20 2150  Weight: 55.8 kg 59 kg    Intake/Output:   Intake/Output Summary (Last 24 hours) at 11/29/2020 0858 Last data filed at 11/29/2020 0600 Gross per 24 hour  Intake 1112.66 ml  Output 2150 ml  Net -1037.34 ml      Physical Exam    General:  Well appearing. No resp difficulty HEENT: Right eye ptosis Neck: Supple. JVP difficult to assess due to RIJ CVC.  None noted at jaw. Carotids 2+ bilat; no bruits. No lymphadenopathy or thyromegaly appreciated. Cor: PMI nondisplaced. Regular rate & rhythm. No rubs, gallops or murmurs. Lungs: Clear Abdomen: Soft, nontender, nondistended. No hepatosplenomegaly. No bruits or masses. Good bowel sounds. Extremities: No cyanosis, clubbing, rash, edema Neuro: Alert and oriented x3.  Left arm weakness at baseline.     Telemetry   SR in 80s.  Personally reviewed   EKG    11/23/2020: ST at 107    Labs    CBC Recent Labs    11/28/20 2256 11/29/20 0101  WBC 11.5* 11.0*  NEUTROABS 9.4* 8.8*  HGB 9.3* 9.6*  HCT 28.7* 29.6*  MCV 87.0 87.3  PLT 91* 95*   Basic Metabolic Panel Recent Labs    11/27/20 0716 11/28/20 0015 11/28/20 0218 11/28/20 1129 11/28/20 1511 11/28/20 1816  NA 129*   < > 126*   < > 129* 130*  K 5.4*    < > 4.7   < > 4.8 4.8  CL 103   < > 98   < > 103 103  CO2 14*   < > 16*   < > 13* 14*  GLUCOSE 219*   < > 135*   < > 102* 163*  BUN 63*   < > 67*   < > 78* 74*  CREATININE 3.20*   < > 3.75*   < > 3.73* 3.01*  CALCIUM 8.6*   < > 8.1*   < > 7.9* 7.9*  MG 2.4  --  2.6*  --   --   --    < > = values in this interval not displayed.   Liver Function Tests Recent Labs    11/28/20 1511 11/28/20 1816  AST 2,837* 2,519*  ALT 2,075* 1,914*  ALKPHOS 109 105  BILITOT 2.6* 2.5*  PROT 6.6 6.5  ALBUMIN 3.5 3.3*   Recent Labs    11/27/20 1038  LIPASE 17   Cardiac Enzymes No results for input(s): CKTOTAL, CKMB, CKMBINDEX, TROPONINI in the last 72 hours.  BNP: BNP (last 3 results) Recent Labs    11/23/20 0224 11/28/20 1511  BNP 48.9 529.6*    ProBNP (last 3 results) No results for input(s): PROBNP in the  last 8760 hours.   D-Dimer No results for input(s): DDIMER in the last 72 hours. Hemoglobin A1C No results for input(s): HGBA1C in the last 72 hours. Fasting Lipid Panel Recent Labs    11/28/20 1129 11/28/20 2256 11/29/20 0101  CHOL 504*  --   --   HDL 22*  --   --   LDLCALC NOT CALCULATED  --   --   TRIG 989*  --  789*  CHOLHDL 22.9  --   --   LDLDIRECT  --  134.0*  --    Thyroid Function Tests Recent Labs    11/28/20 1511  TSH 0.098*    Other results:   Imaging    US Abdomen Limited  Result Date: 11/28/2020 CLINICAL DATA:  Elevated LFTs. EXAM: ULTRASOUND ABDOMEN LIMITED RIGHT UPPER QUADRANT COMPARISON:  Ultrasound 04/18/2020. Abdomen pelvis CT 11/22/2020, CT 11/24/2020 FINDINGS: Gallbladder: Partially distended. Borderline wall thickening of 3 mm. Small volume pericholecystic fluid. No gallstones. Positive sonographic Murphy sign noted by sonographer. Common bile duct: Diameter: 5-6 mm. Liver: Heterogeneous and diffusely increased in parenchymal echogenicity. The liver parenchyma is difficult to penetrate. Less echogenic parenchyma adjacent to the gallbladder  fossa may be related to edema or focal fatty sparing. No capsular nodularity. Portal vein is patent on color Doppler imaging with normal direction of blood flow towards the liver. Other: Trace free fluid adjacent to the liver. IMPRESSION: 1. Heterogeneous increased hepatic echogenicity consistent with steatosis. 2. No gallstones, however there is equivocal gallbladder wall thickening with small amount of pericholecystic fluid and a positive sonographic Murphy sign reported by the sonographer. This may be reactive due to liver disease, however possibility of acalculous cholecystitis is raised. As clinically indicated, consider nuclear medicine hepatobiliary scan. 3. No biliary dilatation. Electronically Signed   By: Keith Rake M.D.   On: 11/28/2020 23:47   ECHOCARDIOGRAM COMPLETE  Result Date: 11/28/2020    ECHOCARDIOGRAM REPORT   Patient Name:   Jennifer [name] Date of Exam: 11/28/2020 Medical Rec #:  [id number]         Height:       57.0 in Accession #:    [id number]        Weight:       130.1 lb Date of Birth:  1991/03/27         BSA:          1.498 m Patient Age:    30 years          BP:           112/81 mmHg Patient Gender: F                 HR:           80 bpm. Exam Location:  Inpatient Procedure: 2D Echo, Cardiac Doppler and Color Doppler Indications:    CHF  History:        Patient has prior history of Echocardiogram examinations, most                 recent 04/15/2019.  Sonographer:    Luisa Hart RDCS Referring Phys: [place]  1. Left ventricular ejection fraction, by estimation, is 25 to 30%. The left ventricle has severely decreased function. The left ventricle demonstrates global hypokinesis. Left ventricular diastolic parameters are consistent with Grade II diastolic dysfunction (pseudonormalization). Elevated left atrial pressure. There is the interventricular septum is flattened in diastole ('D' shaped left ventricle), consistent with right ventricular volume overload.   2.  Right ventricular systolic function is mildly reduced. The right ventricular size is normal. There is mildly elevated pulmonary artery systolic pressure.  3. The mitral valve is normal in structure. Mild to moderate mitral valve regurgitation.  4. Tricuspid valve regurgitation is moderate to severe.  5. The aortic valve is normal in structure. Aortic valve regurgitation is mild. FINDINGS  Left Ventricle: Left ventricular ejection fraction, by estimation, is 25 to 30%. The left ventricle has severely decreased function. The left ventricle demonstrates global hypokinesis. The left ventricular internal cavity size was normal in size. There is no left ventricular hypertrophy. The interventricular septum is flattened in diastole ('D' shaped left ventricle), consistent with right ventricular volume overload. Left ventricular diastolic parameters are consistent with Grade II diastolic dysfunction (pseudonormalization). Elevated left atrial pressure. Right Ventricle: The right ventricular size is normal. No increase in right ventricular wall thickness. Right ventricular systolic function is mildly reduced. There is mildly elevated pulmonary artery systolic pressure. The tricuspid regurgitant velocity  is 2.96 m/s, and with an assumed right atrial pressure of 3 mmHg, the estimated right ventricular systolic pressure is 48.2 mmHg. Left Atrium: Left atrial size was normal in size. Right Atrium: Right atrial size was normal in size. Pericardium: There is no evidence of pericardial effusion. Mitral Valve: The mitral valve is normal in structure. Mild to moderate mitral valve regurgitation, with centrally-directed jet. Tricuspid Valve: The tricuspid valve is normal in structure. Tricuspid valve regurgitation is moderate to severe. Aortic Valve: The aortic valve is normal in structure. Aortic valve regurgitation is mild. Aortic regurgitation PHT measures 367 msec. Aortic valve mean gradient measures 3.0 mmHg. Aortic valve peak  gradient measures 4.8 mmHg. Aortic valve area, by VTI measures 1.93 cm. Pulmonic Valve: The pulmonic valve was normal in structure. Pulmonic valve regurgitation is trivial. Aorta: The aortic root was not well visualized. IAS/Shunts: No atrial level shunt detected by color flow Doppler.  LEFT VENTRICLE PLAX 2D LVIDd:         4.40 cm     Diastology LVIDs:         3.80 cm     LV e' medial:    5.11 cm/s LV PW:         0.90 cm     LV E/e' medial:  17.8 LV IVS:        1.00 cm     LV e' lateral:   9.03 cm/s LVOT diam:     1.80 cm     LV E/e' lateral: 10.1 LV SV:         37 LV SV Index:   25 LVOT Area:     2.54 cm  LV Volumes (MOD) LV vol d, MOD A4C: 70.3 ml LV vol s, MOD A4C: 51.3 ml LV SV MOD A4C:     70.3 ml RIGHT VENTRICLE RV S prime:     9.36 cm/s TAPSE (M-mode): 1.3 cm LEFT ATRIUM             Index       RIGHT ATRIUM           Index LA diam:        3.30 cm 2.20 cm/m  RA Area:     11.20 cm LA Vol (A2C):   27.7 ml 18.49 ml/m RA Volume:   21.70 ml  14.49 ml/m LA Vol (A4C):   22.8 ml 15.22 ml/m LA Biplane Vol: 25.3 ml 16.89 ml/m  AORTIC VALVE  PULMONIC VALVE AV Area (Vmax):    2.18 cm    PV Vmax:          0.84 m/s AV Area (Vmean):   2.04 cm    PV Vmean:         57.000 cm/s AV Area (VTI):     1.93 cm    PV VTI:           0.140 m AV Vmax:           109.00 cm/s PV Peak grad:     2.8 mmHg AV Vmean:          73.900 cm/s PV Mean grad:     2.0 mmHg AV VTI:            0.191 m     PR End Diast Vel: 2.92 msec AV Peak Grad:      4.8 mmHg AV Mean Grad:      3.0 mmHg LVOT Vmax:         93.50 cm/s LVOT Vmean:        59.100 cm/s LVOT VTI:          0.145 m LVOT/AV VTI ratio: 0.76 AI PHT:            367 msec AR Vena Contracta: 0.20 cm  AORTA Ao Root diam: 2.20 cm Ao Asc diam:  2.50 cm MITRAL VALVE                TRICUSPID VALVE MV Area (PHT): 6.27 cm     TR Peak grad:   35.0 mmHg MV Decel Time: 121 msec     TR Vmax:        296.00 cm/s MR Vena Contracta: 0.30 cm MR PISA:           1.01 cm SHUNTS MR PISA  Radius:    0.40 cm  Systemic VTI:  0.14 m MV E velocity: 90.80 cm/s   Systemic Diam: 1.80 cm MV A velocity: 32.30 cm/s MV E/A ratio:  2.81 Mihai Croitoru MD Electronically signed by Sanda Klein MD Signature Date/Time: 11/28/2020/1:16:35 PM    Final    [place] EKG SITE RITE  Result Date: 11/28/2020 If Site Rite image not attached, placement could not be confirmed due to current cardiac rhythm.    Medications:     Scheduled Medications:  Chlorhexidine Gluconate Cloth  6 each Topical Daily   DULoxetine  60 mg Oral Daily   famotidine  10 mg Oral Daily   fenofibrate  160 mg Oral Daily   heparin injection (subcutaneous)  5,000 Units Subcutaneous Q8H   lactulose  20 g Oral TID   levothyroxine  125 mcg Oral Q0600   lidocaine  15 mL Oral QID   norethindrone-ethinyl estradiol  1 tablet Oral Daily   sodium bicarbonate  650 mg Oral TID   sodium chloride flush  10-40 mL Intracatheter Q12H   sodium chloride flush  3 mL Intravenous Q12H    Infusions:  sodium chloride     sodium chloride     dextrose 5 % and 0.9% NaCl Stopped (11/29/20 0150)   DOBUTamine 2.5 mcg/kg/min (11/29/20 0600)   insulin     norepinephrine (LEVOPHED) Adult infusion      PRN Medications: sodium chloride, diphenhydrAMINE, HYDROmorphone (DILAUDID) injection, ondansetron (ZOFRAN) IV, phenol, prochlorperazine, senna-docusate, sodium chloride flush, sodium chloride flush  Assessment/Plan  1. Acute on chronic systolic HF -> cardiogenic shock - likely NICM - Echo 1/22: EF 35-40% Mod MR - Echo  11/28/20 20-25% with moderate RV dysfunction and mild septal flattening. Mod-severe TR - HS troponin 29 argues against acute myocarditis - Continue dobutamine at 2.5 and trend CVP/co-oximetry  - CVC placed -- await co-oximetry and CVP measurements  - Volume status improved. IV Lasix ordered  - Repeat cMRI. May need RHC - Rheum serologies pending  - Consider stress-dose steroids if not responding to inotrope support - Holding Entresto  due to AKI. No beta blocker due to shock     2. AKI  - Due to ATN/shock  - Scr 1.57 -> 3.12 -> 3.91 -> 3.01 -> 3.11 - support with inotropes and/or pressors. Keep MAPs > 65 - Hold Entresto    3. Shock liver - continue hemodynamic support - consider holding statin - CT shows severe hepatic steatosis - LFTs improving   4. Severe hyperTG - due to LPL deficiency - improving with insulin gtt.  Now down to 789 from >4000 - Continue fenofibrate   5. Hyponatremia - Slightly improved to 131 from 130.   - Suspect d/t hypervolemia    6. Type I DM - per CCM  Length of Stay: [place], NP  11/29/2020, 8:58 AM  Advanced Heart Failure Team Pager [phone] (M-F; 7a - 5p)  Please contact [facility] for night-coverage after hours (5p -7a ) and weekends on [email or web address]  Agree with above.  Remains on DBA 2.5. Feels better. Denies SOB, orthopnea or PND.  Central line placed, Co-ox 48%. SBPs now running high. LFTs down. Creatinine stabilizing. Auto-diuresing well.   General:  Lying in bed No resp difficulty HEENT: normal  R ptosis Neck: supple. JVP to jaw Carotids 2+ bilat; no bruits. No lymphadenopathy or thryomegaly appreciated. Cor: PMI nondisplaced. Regular rate & rhythm. No rubs, gallops or murmurs. Lungs: clear Abdomen: soft, nontender, + distended. No hepatosplenomegaly. No bruits or masses. Good bowel sounds. Extremities: no cyanosis, clubbing, rash, 1+ edema Neuro: alert & orientedx3, cranial nerves grossly intact. L-side weak Affect pleasant  Symptomatically better with inotropic support. Co-ox still low. BP much improved. End-organ function improving. Hstrop low.   Given high BPs. Will add milrinone 0.25. [name] auto-diuresing. Can give additional lasix as needed. cMRI on Tuesday.   CRITICAL CARE Performed by: Glori Bickers  Total critical care time: 35 minutes  Critical care time was exclusive of separately billable procedures and treating other  patients.  Critical care was necessary to treat or prevent imminent or life-threatening deterioration.  Critical care was time spent personally by me (independent of midlevel providers or residents) on the following activities: development of treatment plan with patient and/or surrogate as well as nursing, discussions with consultants, evaluation of patient's response to treatment, examination of patient, obtaining history from patient or surrogate, ordering and performing treatments and interventions, ordering and review of laboratory studies, ordering and review of radiographic studies, pulse oximetry and re-evaluation of patient's condition.  Glori Bickers, MD  10:17 AM

## 2020-11-29 NOTE — Progress Notes (Addendum)
[facility] ROUNDING NOTE   Subjective:   Interval History: Patient was transferred to [facility] due to worsening shock and 2D echo that demonstrated worsening heart failure with an ejection fraction of 20 to 25%.  She was started on dobutamine at that point.  Baseline creatinine 0.5 mg/dL with abrupt rise in her creatinine since admission 11/22/2020.  [name] nonoliguric.  Urine sediment is bland.  There is no evidence of any obstruction.   Blood pressure 144/84 pulse 86 temperature afebrile O2 sats 100% room air  Urine output 950 cc 11/28/2020 Most recent labs sodium  133  K 4  Cl 104  CO2 19   Glc 213   Cr 2.57   BUN 54   Ca 7.8  .  Hemoglobin 9.6    Objective:  Vital signs in last 24 hours:  Temp:  [97.8 F (36.6 C)-98.2 F (36.8 C)] 98.2 F (36.8 C) (09/03 2200) Pulse Rate:  [39-109] 83 (09/04 0700) Resp:  [16-33] 25 (09/04 0700) BP: (79-158)/(37-108) 144/84 (09/04 0700) SpO2:  [92 %-100 %] 100 % (09/04 0700)  Weight change:  Filed Weights   11/23/20 0355 11/27/20 2150  Weight: 55.8 kg 59 kg    Intake/Output: I/O last 3 completed shifts: In: 1524.4 [P.O.:220; I.V.:1304.4] Out: 1550 [Urine:1550]   Intake/Output this shift:  No intake/output data recorded.  CVS- RRR no murmurs rubs gallops RS- CTA no wheeze or rales ABD- BS present soft non-distended EXT- no edema   Basic Metabolic Panel: Recent Labs  Lab 11/25/20 0401 11/26/20 0408 11/27/20 0716 11/28/20 0015 11/28/20 0218 11/28/20 1129 11/28/20 1511 11/28/20 1816  NA 135 131* 129* 126* 126* 126* 129* 130*  K 5.6* 5.2* 5.4* 5.5* 4.7 4.6 4.8 4.8  CL 105 100 103 99 98 99 103 103  CO2 20* 14* 14* 14* 16* 17* 13* 14*  GLUCOSE 212* 230* 219* 218* 135* 71 102* 163*  BUN 42* 53* 63* 65* 67* 73* 78* 74*  CREATININE 1.57* 3.12* 3.20* 3.67* 3.75* 3.91* 3.73* 3.01*  CALCIUM 8.9 9.3 8.6* 8.1* 8.1* 7.8* 7.9* 7.9*  MG 3.0* 2.5* 2.4  --  2.6*  --   --   --     Liver Function Tests: Recent  Labs  Lab 11/22/20 1843 11/28/20 1511 11/28/20 1816  AST 101* 2,837* 2,519*  ALT 74* 2,075* 1,914*  ALKPHOS 84 109 105  BILITOT 3.0* 2.6* 2.5*  PROT RESULTS UNAVAILABLE DUE TO INTERFERING SUBSTANCE 6.6 6.5  ALBUMIN 3.7 3.5 3.3*   Recent Labs  Lab 11/22/20 1843 11/25/20 1709 11/27/20 1038  LIPASE 29 21 17    Recent Labs  Lab 11/28/20 1511  AMMONIA 67*    CBC: Recent Labs  Lab 11/26/20 0408 11/27/20 0716 11/28/20 0218 11/28/20 2256 11/29/20 0101  WBC 9.2 10.7* 9.8 11.5* 11.0*  NEUTROABS 5.4 7.3 6.7 9.4* 8.8*  HGB 11.5* 10.9* 10.2* 9.3* 9.6*  HCT 35.7* 33.4* 32.2* 28.7* 29.6*  MCV 88.8 85.9 89.0 87.0 87.3  PLT 191 156 119* 91* 95*    Cardiac Enzymes: No results for input(s): CKTOTAL, CKMB, CKMBINDEX, TROPONINI in the last 168 hours.  BNP: Invalid input(s): POCBNP  CBG: Recent Labs  Lab 11/29/20 0211 11/29/20 0248 11/29/20 0358 11/29/20 0509 11/29/20 0653  GLUCAP 201* 212* 214* 217* 225*    Microbiology: Results for orders placed or performed during the hospital encounter of 11/22/20  Resp Panel by RT-PCR (Flu A&B, Covid) Nasopharyngeal Swab     Status: None   Collection Time: 11/23/20  1:25 AM   Specimen: Nasopharyngeal Swab; Nasopharyngeal(NP) swabs in vial transport medium  Result Value Ref Range Status   SARS Coronavirus 2 by RT PCR NEGATIVE NEGATIVE Final    Comment: (NOTE) SARS-CoV-2 target nucleic acids are NOT DETECTED.  The SARS-CoV-2 RNA is generally detectable in upper respiratory specimens during the acute phase of infection. The lowest concentration of SARS-CoV-2 viral copies this assay can detect is 138 copies/mL. A negative result does not preclude SARS-Cov-2 infection and should not be used as the sole basis for treatment or other patient management decisions. A negative result may occur with  improper specimen collection/handling, submission of specimen other than nasopharyngeal swab, presence of viral mutation(s) within  the areas targeted by this assay, and inadequate number of viral copies(<138 copies/mL). A negative result must be combined with clinical observations, patient history, and epidemiological information. The expected result is Negative.  Fact Sheet for Patients:  EntrepreneurPulse.com.au  Fact Sheet for Healthcare Providers:  IncredibleEmployment.be  This test is no t yet approved or cleared by the [place] FDA and  has been authorized for detection and/or diagnosis of SARS-CoV-2 by FDA under an Emergency Use Authorization (EUA). This EUA will remain  in effect (meaning this test can be used) for the duration of the COVID-19 declaration under Section 564(b)(1) of the Act, 21 U.S.C.section 360bbb-3(b)(1), unless the authorization is terminated  or revoked sooner.       Influenza A by PCR NEGATIVE NEGATIVE Final   Influenza B by PCR NEGATIVE NEGATIVE Final    Comment: (NOTE) The Xpert Xpress SARS-CoV-2/FLU/RSV plus assay is intended as an aid in the diagnosis of influenza from Nasopharyngeal swab specimens and should not be used as a sole basis for treatment. Nasal washings and aspirates are unacceptable for Xpert Xpress SARS-CoV-2/FLU/RSV testing.  Fact Sheet for Patients: EntrepreneurPulse.com.au  Fact Sheet for Healthcare Providers: IncredibleEmployment.be  This test is not yet approved or cleared by the [place] FDA and has been authorized for detection and/or diagnosis of SARS-CoV-2 by FDA under an Emergency Use Authorization (EUA). This EUA will remain in effect (meaning this test can be used) for the duration of the COVID-19 declaration under Section 564(b)(1) of the Act, 21 U.S.C. section 360bbb-3(b)(1), unless the authorization is terminated or revoked.  Performed at [facility], [place] [address] [place], [place] [address]   Gastrointestinal Panel by PCR , Stool      Status: Abnormal   Collection Time: 11/24/20  3:50 PM   Specimen: Rectum; Stool  Result Value Ref Range Status   Campylobacter species NOT DETECTED NOT DETECTED Final   Plesimonas shigelloides NOT DETECTED NOT DETECTED Final   Salmonella species NOT DETECTED NOT DETECTED Final   Yersinia enterocolitica NOT DETECTED NOT DETECTED Final   Vibrio species NOT DETECTED NOT DETECTED Final   Vibrio cholerae NOT DETECTED NOT DETECTED Final   Enteroaggregative E coli (EAEC) NOT DETECTED NOT DETECTED Final   Enteropathogenic E coli (EPEC) DETECTED (A) NOT DETECTED Final    Comment: RESULT CALLED TO, READ BACK BY AND VERIFIED WITH: MEGAN BULLINS 11/26/20 1349 KLW    Enterotoxigenic E coli (ETEC) NOT DETECTED NOT DETECTED Final   Shiga like toxin producing E coli (STEC) NOT DETECTED NOT DETECTED Final   Shigella/Enteroinvasive E coli (EIEC) NOT DETECTED NOT DETECTED Final   Cryptosporidium NOT DETECTED NOT DETECTED Final   Cyclospora cayetanensis NOT DETECTED NOT DETECTED Final   Entamoeba histolytica NOT DETECTED NOT DETECTED Final   Giardia lamblia NOT DETECTED  NOT DETECTED Final   Adenovirus F40/41 NOT DETECTED NOT DETECTED Final   Astrovirus NOT DETECTED NOT DETECTED Final   Norovirus GI/GII NOT DETECTED NOT DETECTED Final   Rotavirus A NOT DETECTED NOT DETECTED Final   Sapovirus (I, II, IV, and V) NOT DETECTED NOT DETECTED Final    Comment: Performed at [facility], [address] [place], [place] [address]  MRSA Next Gen by PCR, Nasal     Status: None   Collection Time: 11/27/20 10:07 PM   Specimen: Nasal Mucosa; Nasal Swab  Result Value Ref Range Status   MRSA by PCR Next Gen NOT DETECTED NOT DETECTED Final    Comment: (NOTE) The GeneXpert MRSA Assay (FDA approved for NASAL specimens only), is one component of a comprehensive MRSA colonization surveillance program. It is not intended to diagnose MRSA infection nor to guide or monitor treatment for MRSA infections. Test  performance is not FDA approved in patients less than 45 years old. Performed at [facility], [place] [address], [place], [place] [address]     Coagulation Studies: Recent Labs    11/28/20 1817  LABPROT 17.9*  INR 1.5*    Urinalysis: No results for input(s): COLORURINE, LABSPEC, PHURINE, GLUCOSEU, HGBUR, BILIRUBINUR, KETONESUR, PROTEINUR, UROBILINOGEN, NITRITE, LEUKOCYTESUR in the last 72 hours.  Invalid input(s): APPERANCEUR    Imaging: US Abdomen Limited  Result Date: 11/28/2020 CLINICAL DATA:  Elevated LFTs. EXAM: ULTRASOUND ABDOMEN LIMITED RIGHT UPPER QUADRANT COMPARISON:  Ultrasound 04/18/2020. Abdomen pelvis CT 11/22/2020, CT 11/24/2020 FINDINGS: Gallbladder: Partially distended. Borderline wall thickening of 3 mm. Small volume pericholecystic fluid. No gallstones. Positive sonographic Murphy sign noted by sonographer. Common bile duct: Diameter: 5-6 mm. Liver: Heterogeneous and diffusely increased in parenchymal echogenicity. The liver parenchyma is difficult to penetrate. Less echogenic parenchyma adjacent to the gallbladder fossa may be related to edema or focal fatty sparing. No capsular nodularity. Portal vein is patent on color Doppler imaging with normal direction of blood flow towards the liver. Other: Trace free fluid adjacent to the liver. IMPRESSION: 1. Heterogeneous increased hepatic echogenicity consistent with steatosis. 2. No gallstones, however there is equivocal gallbladder wall thickening with small amount of pericholecystic fluid and a positive sonographic Murphy sign reported by the sonographer. This may be reactive due to liver disease, however possibility of acalculous cholecystitis is raised. As clinically indicated, consider nuclear medicine hepatobiliary scan. 3. No biliary dilatation. Electronically Signed   By: Keith Rake M.D.   On: 11/28/2020 23:47   ECHOCARDIOGRAM COMPLETE  Result Date: 11/28/2020    ECHOCARDIOGRAM REPORT   Patient  Name:   Jennifer [name] Date of Exam: 11/28/2020 Medical Rec #:  [id number]         Height:       57.0 in Accession #:    [id number]        Weight:       130.1 lb Date of Birth:  Jan 28, 1991         BSA:          1.498 m Patient Age:    30 years          BP:           112/81 mmHg Patient Gender: F                 HR:           80 bpm. Exam Location:  Inpatient Procedure: 2D Echo, Cardiac Doppler and Color Doppler Indications:    CHF  History:        Patient has prior history of Echocardiogram examinations, most                 recent 04/15/2019.  Sonographer:    Luisa Hart RDCS Referring Phys: [place]  1. Left ventricular ejection fraction, by estimation, is 25 to 30%. The left ventricle has severely decreased function. The left ventricle demonstrates global hypokinesis. Left ventricular diastolic parameters are consistent with Grade II diastolic dysfunction (pseudonormalization). Elevated left atrial pressure. There is the interventricular septum is flattened in diastole ('D' shaped left ventricle), consistent with right ventricular volume overload.  2. Right ventricular systolic function is mildly reduced. The right ventricular size is normal. There is mildly elevated pulmonary artery systolic pressure.  3. The mitral valve is normal in structure. Mild to moderate mitral valve regurgitation.  4. Tricuspid valve regurgitation is moderate to severe.  5. The aortic valve is normal in structure. Aortic valve regurgitation is mild. FINDINGS  Left Ventricle: Left ventricular ejection fraction, by estimation, is 25 to 30%. The left ventricle has severely decreased function. The left ventricle demonstrates global hypokinesis. The left ventricular internal cavity size was normal in size. There is no left ventricular hypertrophy. The interventricular septum is flattened in diastole ('D' shaped left ventricle), consistent with right ventricular volume overload. Left ventricular diastolic parameters are  consistent with Grade II diastolic dysfunction (pseudonormalization). Elevated left atrial pressure. Right Ventricle: The right ventricular size is normal. No increase in right ventricular wall thickness. Right ventricular systolic function is mildly reduced. There is mildly elevated pulmonary artery systolic pressure. The tricuspid regurgitant velocity  is 2.96 m/s, and with an assumed right atrial pressure of 3 mmHg, the estimated right ventricular systolic pressure is 10.9 mmHg. Left Atrium: Left atrial size was normal in size. Right Atrium: Right atrial size was normal in size. Pericardium: There is no evidence of pericardial effusion. Mitral Valve: The mitral valve is normal in structure. Mild to moderate mitral valve regurgitation, with centrally-directed jet. Tricuspid Valve: The tricuspid valve is normal in structure. Tricuspid valve regurgitation is moderate to severe. Aortic Valve: The aortic valve is normal in structure. Aortic valve regurgitation is mild. Aortic regurgitation PHT measures 367 msec. Aortic valve mean gradient measures 3.0 mmHg. Aortic valve peak gradient measures 4.8 mmHg. Aortic valve area, by VTI measures 1.93 cm. Pulmonic Valve: The pulmonic valve was normal in structure. Pulmonic valve regurgitation is trivial. Aorta: The aortic root was not well visualized. IAS/Shunts: No atrial level shunt detected by color flow Doppler.  LEFT VENTRICLE PLAX 2D LVIDd:         4.40 cm     Diastology LVIDs:         3.80 cm     LV e' medial:    5.11 cm/s LV PW:         0.90 cm     LV E/e' medial:  17.8 LV IVS:        1.00 cm     LV e' lateral:   9.03 cm/s LVOT diam:     1.80 cm     LV E/e' lateral: 10.1 LV SV:         37 LV SV Index:   25 LVOT Area:     2.54 cm  LV Volumes (MOD) LV vol d, MOD A4C: 70.3 ml LV vol s, MOD A4C: 51.3 ml LV SV MOD A4C:     70.3 ml RIGHT VENTRICLE RV S prime:  9.36 cm/s TAPSE (M-mode): 1.3 cm LEFT ATRIUM             Index       RIGHT ATRIUM           Index LA diam:         3.30 cm 2.20 cm/m  RA Area:     11.20 cm LA Vol (A2C):   27.7 ml 18.49 ml/m RA Volume:   21.70 ml  14.49 ml/m LA Vol (A4C):   22.8 ml 15.22 ml/m LA Biplane Vol: 25.3 ml 16.89 ml/m  AORTIC VALVE                   PULMONIC VALVE AV Area (Vmax):    2.18 cm    PV Vmax:          0.84 m/s AV Area (Vmean):   2.04 cm    PV Vmean:         57.000 cm/s AV Area (VTI):     1.93 cm    PV VTI:           0.140 m AV Vmax:           109.00 cm/s PV Peak grad:     2.8 mmHg AV Vmean:          73.900 cm/s PV Mean grad:     2.0 mmHg AV VTI:            0.191 m     PR End Diast Vel: 2.92 msec AV Peak Grad:      4.8 mmHg AV Mean Grad:      3.0 mmHg LVOT Vmax:         93.50 cm/s LVOT Vmean:        59.100 cm/s LVOT VTI:          0.145 m LVOT/AV VTI ratio: 0.76 AI PHT:            367 msec AR Vena Contracta: 0.20 cm  AORTA Ao Root diam: 2.20 cm Ao Asc diam:  2.50 cm MITRAL VALVE                TRICUSPID VALVE MV Area (PHT): 6.27 cm     TR Peak grad:   35.0 mmHg MV Decel Time: 121 msec     TR Vmax:        296.00 cm/s MR Vena Contracta: 0.30 cm MR PISA:           1.01 cm SHUNTS MR PISA Radius:    0.40 cm  Systemic VTI:  0.14 m MV E velocity: 90.80 cm/s   Systemic Diam: 1.80 cm MV A velocity: 32.30 cm/s MV E/A ratio:  2.81 Mihai Croitoru MD Electronically signed by Sanda Klein MD Signature Date/Time: 11/28/2020/1:16:35 PM    Final    [place] EKG SITE RITE  Result Date: 11/28/2020 If Site Rite image not attached, placement could not be confirmed due to current cardiac rhythm.    Medications:    sodium chloride     sodium chloride     dextrose 5 % and 0.9% NaCl Stopped (11/29/20 0150)   DOBUTamine 2.5 mcg/kg/min (11/29/20 0000)   insulin 0.05 Units/kg/hr (11/29/20 0000)   norepinephrine (LEVOPHED) Adult infusion      Chlorhexidine Gluconate Cloth  6 each Topical Daily   DULoxetine  60 mg Oral Daily   famotidine  10 mg Oral Daily   fenofibrate  160 mg Oral Daily   heparin injection (subcutaneous)  5,000 Units Subcutaneous  Q8H   lactulose  20 g Oral TID   levothyroxine  125 mcg Oral Q0600   lidocaine  15 mL Oral QID   norethindrone-ethinyl estradiol  1 tablet Oral Daily   sodium bicarbonate  650 mg Oral TID   sodium chloride flush  10-40 mL Intracatheter Q12H   sodium chloride flush  3 mL Intravenous Q12H   sodium chloride, diphenhydrAMINE, HYDROmorphone (DILAUDID) injection, ondansetron (ZOFRAN) IV, phenol, prochlorperazine, senna-docusate, sodium chloride flush, sodium chloride flush  Assessment/ Plan:  Acute kidney injury: Outpatient medications included Entresto , ibuprofen and Lasix.  CT scan did not reveal any evidence of hydronephrosis urinalysis is bland.  Blood pressures are low.  Patient received IV contrast.  All this could have contributed to ischemic ATN.  Continue to avoid nephrotoxins.  Renally adjust medications.  Avoid ACE inhibitor's ARB's nonsteroidal anti-inflammatory drugs.  Check renal panel on a daily basis.  Close follow-up of I's and O's ANEMIA-does not appear to be an issue. MBD-continue to follow. HTN/VOL-maintains hypotension.  Now started on dopamine due to hypotension.  2D echo showed decreased ejection fraction.  A.m. cortisol 41.5 Hypothyroidism.  Replacement therapy. Diabetes mellitus controlled. Polycystic ovarian syndrome on birth control. CT angio revealing plaque in the internal iliac artery. Hyperlipidemia followed [facility] Congestive heart failure.  Transferred to heart appreciate assistance of Dr. Haroldine Laws  LOS: [employer] @TODAY @7 :07 AM

## 2020-11-30 DIAGNOSIS — R570 Cardiogenic shock: Secondary | ICD-10-CM | POA: Diagnosis not present

## 2020-11-30 DIAGNOSIS — R197 Diarrhea, unspecified: Secondary | ICD-10-CM

## 2020-11-30 DIAGNOSIS — N179 Acute kidney failure, unspecified: Secondary | ICD-10-CM | POA: Diagnosis not present

## 2020-11-30 DIAGNOSIS — I5041 Acute combined systolic (congestive) and diastolic (congestive) heart failure: Secondary | ICD-10-CM | POA: Diagnosis not present

## 2020-11-30 LAB — CBC
HCT: 30.4 % — ABNORMAL LOW (ref 36.0–46.0)
Hemoglobin: 10.2 g/dL — ABNORMAL LOW (ref 12.0–15.0)
MCH: 29.4 pg (ref 26.0–34.0)
MCHC: 33.6 g/dL (ref 30.0–36.0)
MCV: 87.6 fL (ref 80.0–100.0)
Platelets: 123 10*3/uL — ABNORMAL LOW (ref 150–400)
RBC: 3.47 MIL/uL — ABNORMAL LOW (ref 3.87–5.11)
RDW: 16.6 % — ABNORMAL HIGH (ref 11.5–15.5)
WBC: 8.4 10*3/uL (ref 4.0–10.5)
nRBC: 0.2 % (ref 0.0–0.2)

## 2020-11-30 LAB — BASIC METABOLIC PANEL
Anion gap: 17 — ABNORMAL HIGH (ref 5–15)
BUN: 47 mg/dL — ABNORMAL HIGH (ref 6–20)
CO2: 17 mmol/L — ABNORMAL LOW (ref 22–32)
Calcium: 7.9 mg/dL — ABNORMAL LOW (ref 8.9–10.3)
Chloride: 100 mmol/L (ref 98–111)
Creatinine, Ser: 2.12 mg/dL — ABNORMAL HIGH (ref 0.44–1.00)
GFR, Estimated: 32 mL/min — ABNORMAL LOW (ref >60)
Glucose, Bld: 228 mg/dL — ABNORMAL HIGH (ref 70–99)
Potassium: 3.8 mmol/L (ref 3.5–5.1)
Sodium: 134 mmol/L — ABNORMAL LOW (ref 135–145)

## 2020-11-30 LAB — LIPID PANEL
Cholesterol: 413 mg/dL — ABNORMAL HIGH (ref 0–200)
LDL Cholesterol: UNDETERMINED mg/dL (ref 0–99)
Triglycerides: 1531 mg/dL — ABNORMAL HIGH (ref <150)
VLDL: UNDETERMINED mg/dL (ref 0–40)

## 2020-11-30 LAB — MAGNESIUM: Magnesium: 2 mg/dL (ref 1.7–2.4)

## 2020-11-30 LAB — GLUCOSE, CAPILLARY
Glucose-Capillary: 111 mg/dL — ABNORMAL HIGH (ref 70–99)
Glucose-Capillary: 123 mg/dL — ABNORMAL HIGH (ref 70–99)
Glucose-Capillary: 126 mg/dL — ABNORMAL HIGH (ref 70–99)
Glucose-Capillary: 129 mg/dL — ABNORMAL HIGH (ref 70–99)
Glucose-Capillary: 132 mg/dL — ABNORMAL HIGH (ref 70–99)
Glucose-Capillary: 145 mg/dL — ABNORMAL HIGH (ref 70–99)
Glucose-Capillary: 149 mg/dL — ABNORMAL HIGH (ref 70–99)
Glucose-Capillary: 157 mg/dL — ABNORMAL HIGH (ref 70–99)
Glucose-Capillary: 178 mg/dL — ABNORMAL HIGH (ref 70–99)
Glucose-Capillary: 212 mg/dL — ABNORMAL HIGH (ref 70–99)
Glucose-Capillary: 229 mg/dL — ABNORMAL HIGH (ref 70–99)
Glucose-Capillary: 242 mg/dL — ABNORMAL HIGH (ref 70–99)
Glucose-Capillary: 64 mg/dL — ABNORMAL LOW (ref 70–99)
Glucose-Capillary: 92 mg/dL (ref 70–99)
Glucose-Capillary: 96 mg/dL (ref 70–99)
Glucose-Capillary: 97 mg/dL (ref 70–99)

## 2020-11-30 LAB — COOXEMETRY PANEL
Carboxyhemoglobin: 1.6 % — ABNORMAL HIGH (ref 0.5–1.5)
Methemoglobin: 2.1 % — ABNORMAL HIGH (ref 0.0–1.5)
O2 Saturation: 76.3 %
Total hemoglobin: 9.9 g/dL — ABNORMAL LOW (ref 12.0–16.0)

## 2020-11-30 LAB — HEPATIC FUNCTION PANEL
ALT: 1251 U/L — ABNORMAL HIGH (ref 0–44)
AST: 501 U/L — ABNORMAL HIGH (ref 15–41)
Albumin: 3.2 g/dL — ABNORMAL LOW (ref 3.5–5.0)
Alkaline Phosphatase: 139 U/L — ABNORMAL HIGH (ref 38–126)
Bilirubin, Direct: 0.8 mg/dL — ABNORMAL HIGH (ref 0.0–0.2)
Indirect Bilirubin: 0.8 mg/dL (ref 0.3–0.9)
Total Bilirubin: 1.6 mg/dL — ABNORMAL HIGH (ref 0.3–1.2)

## 2020-11-30 LAB — LDL CHOLESTEROL, DIRECT
Direct LDL: UNDETERMINED mg/dL (ref 0–99)
Direct LDL: 136.4 mg/dL — ABNORMAL HIGH (ref 0–99)

## 2020-11-30 LAB — VITAMIN D 25 HYDROXY (VIT D DEFICIENCY, FRACTURES): Vit D, 25-Hydroxy: 13.33 ng/mL — ABNORMAL LOW (ref 30–100)

## 2020-11-30 LAB — TRIGLYCERIDES: Triglycerides: 1518 mg/dL — ABNORMAL HIGH (ref <150)

## 2020-11-30 LAB — T3: T3, Total: 70 ng/dL — ABNORMAL LOW (ref 71–180)

## 2020-11-30 LAB — PHOSPHORUS: Phosphorus: 2.9 mg/dL (ref 2.5–4.6)

## 2020-11-30 MED ORDER — POTASSIUM CHLORIDE CRYS ER 20 MEQ PO TBCR: 40.0000 meq | EXTENDED_RELEASE_TABLET | Freq: Once | ORAL | Status: DC

## 2020-11-30 MED ORDER — FUROSEMIDE 10 MG/ML IJ SOLN
40.0000 mg | Freq: Once | INTRAMUSCULAR | Status: AC
  Administered 2020-11-30: 40 mg via INTRAVENOUS
  Filled 2020-11-30: qty 4

## 2020-11-30 MED ORDER — POTASSIUM CHLORIDE CRYS ER 10 MEQ PO TBCR
40.0000 meq | EXTENDED_RELEASE_TABLET | Freq: Once | ORAL | Status: AC
  Administered 2020-11-30: 40 meq via ORAL
  Filled 2020-11-30: qty 4

## 2020-11-30 MED ORDER — ADULT MULTIVITAMIN W/MINERALS CH
1.0000 | ORAL_TABLET | Freq: Every day | ORAL | Status: DC
  Administered 2020-11-30 – 2020-12-06 (×7): 1 via ORAL
  Filled 2020-11-30 (×7): qty 1

## 2020-11-30 MED ORDER — PREGABALIN 75 MG PO CAPS: 300.0000 mg | ORAL_CAPSULE | Freq: Two times a day (BID) | ORAL | Status: DC

## 2020-11-30 MED ORDER — ATORVASTATIN CALCIUM 40 MG PO TABS
40.0000 mg | ORAL_TABLET | Freq: Every day | ORAL | Status: DC
  Administered 2020-11-30 – 2020-12-01 (×2): 40 mg via ORAL
  Filled 2020-11-30 (×2): qty 1

## 2020-11-30 MED ORDER — PANCRELIPASE (LIP-PROT-AMYL) 12000-38000 UNITS PO CPEP
12000.0000 [IU] | ORAL_CAPSULE | Freq: Three times a day (TID) | ORAL | Status: DC | PRN
  Filled 2020-11-30: qty 1

## 2020-11-30 MED ORDER — PANCRELIPASE (LIP-PROT-AMYL) 12000-38000 UNITS PO CPEP
24000.0000 [IU] | ORAL_CAPSULE | Freq: Three times a day (TID) | ORAL | Status: DC
  Administered 2020-11-30 – 2020-12-02 (×6): 24000 [IU] via ORAL
  Filled 2020-11-30 (×7): qty 2

## 2020-11-30 NOTE — TOC Initial Note (Signed)
Transition of Care (TOC) - Initial/Assessment Note  Heart Failure   Patient Details  Name: Jennifer [name] MRN: [id number] Date of Birth: 08-03-1990  Transition of Care [facility]) CM/SW Contact:    [place], [place] Phone Number: 11/30/2020, 11:45 AM  Clinical Narrative:                 CSW spoke with the patient at bedside and completed a very brief SDOH with the patient who denied having any needs at this time. Ms. Jennifer [name] reported shes does have a PCP and she can get to the pharmacy to pick up her medications. CSW provided the patient with the social workers name and position and if anything changes to please reach out so that CSW can provide support.  CSW will continue to follow throughout discharge.  Expected Discharge Plan: OP Rehab Barriers to Discharge: Continued Medical Work up   Patient Goals and CMS Choice        Expected Discharge Plan and Services Expected Discharge Plan: OP Rehab In-house Referral: Clinical Social Work     Living arrangements for the past 2 months: Apartment                                      Prior Living Arrangements/Services Living arrangements for the past 2 months: Apartment   Patient language and need for interpreter reviewed:: Yes        Need for Family Participation in Patient Care: No (Comment) Care giver support system in place?: No (comment)   Criminal Activity/Legal Involvement Pertinent to Current Situation/Hospitalization: No - Comment as needed  Activities of Daily Living Home Assistive Devices/Equipment: Eyeglasses, Other (Comment) (dexcom 6 glucose monitoring system) ADL Screening (condition at time of admission) Patient's cognitive ability adequate to safely complete daily activities?: Yes Is the patient deaf or have difficulty hearing?: No Does the patient have difficulty seeing, even when wearing glasses/contacts?: No Does the patient have difficulty concentrating, remembering, or making decisions?:  No Patient able to express need for assistance with ADLs?: Yes Does the patient have difficulty dressing or bathing?: No Independently performs ADLs?: Yes (appropriate for developmental age) Does the patient have difficulty walking or climbing stairs?: No Weakness of Legs: None Weakness of Arms/Hands: None  Permission Sought/Granted                  Emotional Assessment Appearance:: Appears stated age Attitude/Demeanor/Rapport: Engaged Affect (typically observed): Pleasant Orientation: : Oriented to Self, Oriented to Place, Oriented to  Time, Oriented to Situation   Psych Involvement: No (comment)  Admission diagnosis:  Vasculitis ([place]) [I77.6] Epigastric pain [R10.13] Tachycardia [R00.0] Abnormal CT scan [R93.89] Diarrhea, unspecified type [R19.7] Patient Active Problem List   Diagnosis Date Noted   Diarrhea    Shock liver    Hypotension 11/28/2020   Acute combined systolic and diastolic heart failure (HCC)    Nonischemic cardiomyopathy ([place])    Acute decompensated heart failure ([place])    Elevated liver enzymes    Cardiogenic shock ([place])    AKI (acute kidney injury) ([place]) 11/26/2020   Abdominal pain 93/26/7124   Chronic systolic CHF (congestive heart failure) ([place]) 11/23/2020   Essential hypertension 11/23/2020   Hypertriglyceridemia 11/23/2020   Prolonged QT interval 11/23/2020   DM (diabetes mellitus) ([place]) 10/10/2018   Hypothyroidism 10/10/2018   PCP:  Sue Lush, PA-C Pharmacy:   [id number] - [place], [place]  [place] [address] Phone: [phone] Fax: [phone]     Social Determinants of Health (SDOH) Interventions Food Insecurity Interventions: Intervention Not Indicated Financial Strain Interventions: Intervention Not Indicated Housing Interventions: Intervention Not Indicated Transportation Interventions: Intervention Not Indicated  Readmission Risk Interventions No  flowsheet data found.  Leor Whyte, MSW, [place] Heart Failure Social Worker

## 2020-11-30 NOTE — Progress Notes (Signed)
Progress Note  Patient Name: Jennifer [name] Date of Encounter: 11/30/2020  Attending physician: Candee Furbish, MD Primary care provider: Sue Lush, PA-C Primary Cardiologist: Dr. Einar Gip Consultant:Sian Joles Terri Skains, DO  Subjective: Jennifer [name] is a 30 y.o. female who was seen and examined at bedside  Denies chest pain. Shortness of breath improving.  Orthopnea and LE swelling improving.  Less abdominal distension. Warmer extremities  No family at bedside.  Case discussed and reviewed with her nurse.  Objective: Vital Signs in the last 24 hours: Temp:  [97.1 F (36.2 C)-98.5 F (36.9 C)] 98.3 F (36.8 C) (09/05 0800) Pulse Rate:  [91-128] 105 (09/05 1000) Resp:  [13-30] 21 (09/05 1000) BP: (104-171)/(59-115) 122/80 (09/05 1000) SpO2:  [93 %-100 %] 95 % (09/05 1000) Weight:  [57.3 kg] 57.3 kg (09/05 0500)  Intake/Output:  Intake/Output Summary (Last 24 hours) at 11/30/2020 1051 Last data filed at 11/30/2020 1000 Gross per 24 hour  Intake 513.5 ml  Output 3500 ml  Net -2986.5 ml    Net IO Since Admission: -1,144.02 mL [11/30/20 1051]  Weights:  Filed Weights   11/23/20 0355 11/27/20 2150 11/30/20 0500  Weight: 55.8 kg 59 kg 57.3 kg    Telemetry: Personally reviewed - sinus tachycardia   Physical examination: PHYSICAL EXAM: Vitals with BMI 11/30/2020 11/30/2020 11/30/2020  Height - - -  Weight - - -  BMI - - -  Systolic 774 142 395  Diastolic 80 88 320  Pulse 105 128 118   CONSTITUTIONAL: Appears older than stated age, well-developed and well-nourished. No acute distress.  Hemodynamically stable. SKIN: Skin is warm and dry. No rash noted. No cyanosis. No pallor. No jaundice HEAD: Normocephalic and atraumatic.  EYES: No scleral icterus MOUTH/THROAT: Moist oral membranes.  NECK: No JVP. No thyromegaly noted. No carotid bruits  LYMPHATIC: No visible cervical adenopathy.  CHEST Normal respiratory effort. No intercostal retractions  LUNGS: CTAB, no rales or  stridor. No wheezes.  CARDIOVASCULAR: Regular, positive E3-X4, soft systolic murmur heard over the left lower sternal border, no gallops or rubs. ABDOMINAL: Soft, less distended, nontender, decreased bowel sounds in all 4 quadrants, EXTREMITIES: Right lower extremity more swollen compared to left (chronic per patient), +1 edema bilaterally, Warm to touch.  HEMATOLOGIC: No significant bruising NEUROLOGIC: Oriented to person, place, and time.  Right proptosis.  Left-sided weakness  PSYCHIATRIC: Normal mood and affect. Normal behavior. Cooperative  Lab Results: Hematology Recent Labs  Lab 11/28/20 2256 11/29/20 0101 11/30/20 0211  WBC 11.5* 11.0* 8.4  RBC 3.30* 3.39* 3.47*  HGB 9.3* 9.6* 10.2*  HCT 28.7* 29.6* 30.4*  MCV 87.0 87.3 87.6  MCH 28.2 28.3 29.4  MCHC 32.4 32.4 33.6  RDW 16.4* 16.5* 16.6*  PLT 91* 95* 123*    Chemistry Recent Labs  Lab 11/28/20 1816 11/29/20 0614 11/29/20 1330 11/29/20 2145 11/30/20 0211 11/30/20 0500  NA 130* 131* 133* 134* 134*  --   K 4.8 4.4 4.0 3.6 3.8  --   CL 103 103 104 100 100  --   CO2 14* 16* 19* 19* 17*  --   GLUCOSE 163* 237* 213* 287* 228*  --   BUN 74* 64* 54* 51* 47*  --   CREATININE 3.01* 3.11* 2.57* 2.55* 2.12*  --   CALCIUM 7.9* 7.8* 7.8* 8.1* 7.9*  --   PROT 6.5 6.0*  --   --   --  RESULTS UNAVAILABLE DUE TO INTERFERING SUBSTANCE  ALBUMIN 3.3* 2.9*  --   --   --  3.2*  AST 2,519* 1,252*  --   --   --  501*  ALT 1,914* 1,671*  --   --   --  1,251*  ALKPHOS 105 124  --   --   --  139*  BILITOT 2.5* 2.6*  --   --   --  1.6*  GFRNONAA 21* 20* 25* 25* 32*  --   ANIONGAP 13 12 10 15  17*  --      Cardiac Enzymes: Cardiac Panel (last 3 results) Recent Labs    11/29/20 0614  TROPONINIHS 29*    BNP (last 3 results) Recent Labs    11/23/20 0224 11/28/20 1511  BNP 48.9 529.6*    ProBNP (last 3 results) No results for input(s): PROBNP in the last 8760 hours.   DDimer No results for input(s): DDIMER in the last 168  hours.   Hemoglobin A1c:  Lab Results  Component Value Date   HGBA1C 7.8 (H) 11/23/2020   MPG 177.16 11/23/2020    TSH  Recent Labs    11/28/20 1511  TSH 0.098*    Lipid Panel     Component Value Date/Time   CHOL 423 (H) 11/29/2020 2145   TRIG 1,518 (H) 11/30/2020 0211   HDL NOT REPORTED DUE TO HIGH TRIGLYCERIDES 11/29/2020 2145   CHOLHDL NOT REPORTED DUE TO HIGH TRIGLYCERIDES 11/29/2020 2145   VLDL UNABLE TO CALCULATE IF TRIGLYCERIDE OVER 400 mg/dL 11/29/2020 2145   LDLCALC UNABLE TO CALCULATE IF TRIGLYCERIDE OVER 400 mg/dL 11/29/2020 2145   LDLDIRECT UNABLE TO CALCULATE IF TRIGLYCERIDE IS >1293 mg/dL 11/29/2020 2145    Imaging: DG Lumbar Spine 2-3 Views  Result Date: 11/29/2020 CLINICAL DATA:  Abdominal pain, previous fall EXAM: LUMBAR SPINE - 2-3 VIEW COMPARISON:  CT 11/24/2020 FINDINGS: There is no evidence of lumbar spine fracture. Alignment is normal. Intervertebral disc spaces are maintained. IMPRESSION: Negative. Electronically Signed   By: Lucrezia Europe M.D.   On: 11/29/2020 12:48   DG Sacrum/Coccyx  Result Date: 11/29/2020 CLINICAL DATA:  Abdominal pain, previous fall EXAM: SACRUM AND COCCYX - 2+ VIEW COMPARISON:  None. FINDINGS: There is no evidence of fracture or other focal bone lesions. IMPRESSION: Negative. Electronically Signed   By: Lucrezia Europe M.D.   On: 11/29/2020 12:51   US Abdomen Limited  Result Date: 11/28/2020 CLINICAL DATA:  Elevated LFTs. EXAM: ULTRASOUND ABDOMEN LIMITED RIGHT UPPER QUADRANT COMPARISON:  Ultrasound 04/18/2020. Abdomen pelvis CT 11/22/2020, CT 11/24/2020 FINDINGS: Gallbladder: Partially distended. Borderline wall thickening of 3 mm. Small volume pericholecystic fluid. No gallstones. Positive sonographic Murphy sign noted by sonographer. Common bile duct: Diameter: 5-6 mm. Liver: Heterogeneous and diffusely increased in parenchymal echogenicity. The liver parenchyma is difficult to penetrate. Less echogenic parenchyma adjacent to the  gallbladder fossa may be related to edema or focal fatty sparing. No capsular nodularity. Portal vein is patent on color Doppler imaging with normal direction of blood flow towards the liver. Other: Trace free fluid adjacent to the liver. IMPRESSION: 1. Heterogeneous increased hepatic echogenicity consistent with steatosis. 2. No gallstones, however there is equivocal gallbladder wall thickening with small amount of pericholecystic fluid and a positive sonographic Murphy sign reported by the sonographer. This may be reactive due to liver disease, however possibility of acalculous cholecystitis is raised. As clinically indicated, consider nuclear medicine hepatobiliary scan. 3. No biliary dilatation. Electronically Signed   By: Keith Rake M.D.   On: 11/28/2020 23:47   DG CHEST PORT 1 VIEW  Result Date: 11/29/2020 CLINICAL DATA:  Status post central line placement. EXAM: PORTABLE CHEST 1 VIEW COMPARISON:  Chest radiograph dated 11/23/2020. FINDINGS: The heart is enlarged. Minimal diffuse bilateral interstitial opacities may represent pulmonary edema. There is no pleural effusion or pneumothorax. A right internal jugular central venous catheter tip overlies the right atrium. IMPRESSION: Right internal jugular central venous catheter tip overlies the right atrium. No pneumothorax. Electronically Signed   By: Zerita Boers M.D.   On: 11/29/2020 12:01   ECHOCARDIOGRAM COMPLETE  Result Date: 11/28/2020    ECHOCARDIOGRAM REPORT   Patient Name:   TANISE RUSSMAN Date of Exam: 11/28/2020 Medical Rec #:  [id number]         Height:       57.0 in Accession #:    [id number]        Weight:       130.1 lb Date of Birth:  15-Nov-1990         BSA:          1.498 m Patient Age:    30 years          BP:           112/81 mmHg Patient Gender: F                 HR:           80 bpm. Exam Location:  Inpatient Procedure: 2D Echo, Cardiac Doppler and Color Doppler Indications:    CHF  History:        Patient has prior history of  Echocardiogram examinations, most                 recent 04/15/2019.  Sonographer:    Luisa Hart RDCS Referring Phys: [place]  1. Left ventricular ejection fraction, by estimation, is 25 to 30%. The left ventricle has severely decreased function. The left ventricle demonstrates global hypokinesis. Left ventricular diastolic parameters are consistent with Grade II diastolic dysfunction (pseudonormalization). Elevated left atrial pressure. There is the interventricular septum is flattened in diastole ('D' shaped left ventricle), consistent with right ventricular volume overload.  2. Right ventricular systolic function is mildly reduced. The right ventricular size is normal. There is mildly elevated pulmonary artery systolic pressure.  3. The mitral valve is normal in structure. Mild to moderate mitral valve regurgitation.  4. Tricuspid valve regurgitation is moderate to severe.  5. The aortic valve is normal in structure. Aortic valve regurgitation is mild. FINDINGS  Left Ventricle: Left ventricular ejection fraction, by estimation, is 25 to 30%. The left ventricle has severely decreased function. The left ventricle demonstrates global hypokinesis. The left ventricular internal cavity size was normal in size. There is no left ventricular hypertrophy. The interventricular septum is flattened in diastole ('D' shaped left ventricle), consistent with right ventricular volume overload. Left ventricular diastolic parameters are consistent with Grade II diastolic dysfunction (pseudonormalization). Elevated left atrial pressure. Right Ventricle: The right ventricular size is normal. No increase in right ventricular wall thickness. Right ventricular systolic function is mildly reduced. There is mildly elevated pulmonary artery systolic pressure. The tricuspid regurgitant velocity  is 2.96 m/s, and with an assumed right atrial pressure of 3 mmHg, the estimated right ventricular systolic pressure is 26.7  mmHg. Left Atrium: Left atrial size was normal in size. Right Atrium: Right atrial size was normal in size. Pericardium: There is no evidence of pericardial effusion. Mitral Valve: The mitral valve is normal in structure. Mild to moderate mitral valve regurgitation, with centrally-directed jet. Tricuspid Valve:  The tricuspid valve is normal in structure. Tricuspid valve regurgitation is moderate to severe. Aortic Valve: The aortic valve is normal in structure. Aortic valve regurgitation is mild. Aortic regurgitation PHT measures 367 msec. Aortic valve mean gradient measures 3.0 mmHg. Aortic valve peak gradient measures 4.8 mmHg. Aortic valve area, by VTI measures 1.93 cm. Pulmonic Valve: The pulmonic valve was normal in structure. Pulmonic valve regurgitation is trivial. Aorta: The aortic root was not well visualized. IAS/Shunts: No atrial level shunt detected by color flow Doppler.  LEFT VENTRICLE PLAX 2D LVIDd:         4.40 cm     Diastology LVIDs:         3.80 cm     LV e' medial:    5.11 cm/s LV PW:         0.90 cm     LV E/e' medial:  17.8 LV IVS:        1.00 cm     LV e' lateral:   9.03 cm/s LVOT diam:     1.80 cm     LV E/e' lateral: 10.1 LV SV:         37 LV SV Index:   25 LVOT Area:     2.54 cm  LV Volumes (MOD) LV vol d, MOD A4C: 70.3 ml LV vol s, MOD A4C: 51.3 ml LV SV MOD A4C:     70.3 ml RIGHT VENTRICLE RV S prime:     9.36 cm/s TAPSE (M-mode): 1.3 cm LEFT ATRIUM             Index       RIGHT ATRIUM           Index LA diam:        3.30 cm 2.20 cm/m  RA Area:     11.20 cm LA Vol (A2C):   27.7 ml 18.49 ml/m RA Volume:   21.70 ml  14.49 ml/m LA Vol (A4C):   22.8 ml 15.22 ml/m LA Biplane Vol: 25.3 ml 16.89 ml/m  AORTIC VALVE                   PULMONIC VALVE AV Area (Vmax):    2.18 cm    PV Vmax:          0.84 m/s AV Area (Vmean):   2.04 cm    PV Vmean:         57.000 cm/s AV Area (VTI):     1.93 cm    PV VTI:           0.140 m AV Vmax:           109.00 cm/s PV Peak grad:     2.8 mmHg AV Vmean:           73.900 cm/s PV Mean grad:     2.0 mmHg AV VTI:            0.191 m     PR End Diast Vel: 2.92 msec AV Peak Grad:      4.8 mmHg AV Mean Grad:      3.0 mmHg LVOT Vmax:         93.50 cm/s LVOT Vmean:        59.100 cm/s LVOT VTI:          0.145 m LVOT/AV VTI ratio: 0.76 AI PHT:            367 msec AR Vena Contracta: 0.20 cm  AORTA Ao Root diam:  2.20 cm Ao Asc diam:  2.50 cm MITRAL VALVE                TRICUSPID VALVE MV Area (PHT): 6.27 cm     TR Peak grad:   35.0 mmHg MV Decel Time: 121 msec     TR Vmax:        296.00 cm/s MR Vena Contracta: 0.30 cm MR PISA:           1.01 cm SHUNTS MR PISA Radius:    0.40 cm  Systemic VTI:  0.14 m MV E velocity: 90.80 cm/s   Systemic Diam: 1.80 cm MV A velocity: 32.30 cm/s MV E/A ratio:  2.81 Mihai Croitoru MD Electronically signed by Sanda Klein MD Signature Date/Time: 11/28/2020/1:16:35 PM    Final    [place] EKG SITE RITE  Result Date: 11/28/2020 If Site Rite image not attached, placement could not be confirmed due to current cardiac rhythm.   Cardiac database: EKG: 11/14/2020: Sinus tachycardia, 107 bpm, poor R wave progression, without underlying injury pattern.Compared to prior ECG 10/07/2020 ventricular rate has improved.   Echocardiogram: 11/28/2020:  1. Left ventricular ejection fraction, by estimation, is 25 to 30%. The left ventricle has severely decreased function. The left ventricle demonstrates global hypokinesis. Left ventricular diastolic parameters are  consistent with Grade II diastolic dysfunction (pseudonormalization). Elevated left atrial pressure. There is the interventricular septum is flattened in diastole ('D' shaped left ventricle), consistent with right ventricular volume overload.   2. Right ventricular systolic function is mildly reduced. The right ventricular size is normal. There is mildly elevated pulmonary artery systolic pressure.   3. The mitral valve is normal in structure. Mild to moderate mitral valve regurgitation.   4. Tricuspid  valve regurgitation is moderate to severe.   5. The aortic valve is normal in structure. Aortic valve regurgitation is  mild.    Stress Testing:  Lexiscan (Walking with mod Bruce) Sestamibi Stress Test 05/08/2019: Nondiagnostic ECG stress. Myocardial perfusion is normal. LV is normal in size both in rest and stress images. Stress LV EF:  calculated at 38% but visually normal. No previous exam available for comparison. Low risk study.   Scheduled Meds:  Chlorhexidine Gluconate Cloth  6 each Topical Daily   DULoxetine  60 mg Oral Daily   famotidine  10 mg Oral Daily   fenofibrate  160 mg Oral Daily   heparin injection (subcutaneous)  5,000 Units Subcutaneous Q8H   levothyroxine  125 mcg Oral Q0600   norethindrone-ethinyl estradiol  1 tablet Oral Daily   sodium bicarbonate  650 mg Oral TID   sodium chloride flush  10-40 mL Intracatheter Q12H   sodium chloride flush  3 mL Intravenous Q12H    Continuous Infusions:  sodium chloride     sodium chloride     dextrose 5 % and 0.9% NaCl Stopped (11/29/20 1355)   insulin 4 Units/hr (11/30/20 1000)   milrinone 0.25 mcg/kg/min (11/30/20 1000)   norepinephrine (LEVOPHED) Adult infusion      PRN Meds: sodium chloride, diphenhydrAMINE, HYDROmorphone (DILAUDID) injection, ondansetron (ZOFRAN) IV, phenol, prochlorperazine, senna-docusate, sodium chloride flush, sodium chloride flush   IMPRESSION & RECOMMENDATIONS: Ariely Riddell is a 30 y.o. 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  Acute on chronic systolic  heart failure stage C, NYHA class II/III: Most likely secondary to nonischemic cardiomyopathy (history of chemotherapy and prior CMR findings  of myocarditis back in 2019 per report). Echocardiogram 03/2020: LVEF 35-40% with moderate MR. Echocardiogram 11/28/2020: LVEF 25-30%, RV dysfunction, moderate to severe TR. Was on both dobutamine and Milrinone as of this morning. However, due to elevated BP, tachycardia, improved Co-ox,  and improving in end organ function HF team d/c dobutamine - agree.  -3.9L UOP yesterday; CR improving but not to baseline.  CVP improving from 14 to 17mHg.  Trend co-oximetry and CVP AST and ALT are improving. Cardiac MRI tentatively planned for Tuesday. Will d/w heart failure team timing of RHC  Troponins not suggestive of myocarditis. Currently holding Entresto/ACE inhibitor/ARB due to acute kidney injury and beta-blocker secondary to shock physiology - can hopefully start in the next 24-48hrs depending on her labs and hemodynamics.   Nonischemic cardiomyopathy: See above  Acute kidney injury: Most likely secondary to ischemic ATN, polypharmacy, recent contrast exposure, cardiorenal physiology. UOP is now negative and Cr improving.  Avoid nephrotoxic agents.  Shock liver secondary to acute/chronic heart failure with reduced EF: AST and ALT improving Monitor for now.  Hypertriglyceridemia with lipoprotein lipase deficiency: TG on arrival 4040 Trending up as of this morning.  Currently on insulin drip. Management per CCM.   Hypothyroidism: Currently on Synthroid. Will defer to CCM  Type 1 diabetes mellitus with hypertriglyceridemia and circulatory complication: Most recent hemoglobin A1c 7.8. Currently on insulin drip. Monitor serum glucose.  Patient's questions and concerns were addressed to her satisfaction. She voices understanding of the instructions provided during this encounter.   This note was created using a voice recognition software as a result there may be grammatical errors inadvertently enclosed that do not reflect the nature of this encounter. Every attempt is made to correct  such errors.  Total time spent : 38 minutes.   SRex Kras DNevada [facility] Pager: [phone]: [phone]

## 2020-11-30 NOTE — Progress Notes (Signed)
Physical Therapy Treatment Patient Details Name: Jennifer [name] MRN: [id number] DOB: Jan 14, 1991 Today's Date: 11/30/2020    History of Present Illness The pt is a 30 yo female presenting 8/28 to [facility] ED for abdominal pain, transferred to [facility] due to hypotension and elevated creatinine. EF found to be 25-30% on ECHO. Pt sustained a fall out of bed on 9/4. 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    PT Comments    The pt was seen for continued progression of OOB mobility and dynamic stability at this time. The pt was able to complete multiple sit-stand transfers with minG for safety, and single UE support to maintain stability. The pt was then able to complete ~120 ft of hallway ambulation with single UE support on IV pole, significant improvement in stability with at least single UE support at this time. Will continue to benefit from skilled PT acutely to progress activity tolerance and stability to facilitate return to independence and safety with OOB transfers and ambulation as the pt would like to return to no AD for mobility.     Follow Up Recommendations  Outpatient PT;Supervision for mobility/OOB     Equipment Recommendations   (needs to trial cane)    Recommendations for Other Services       Precautions / Restrictions Precautions Precautions: Fall Precaution Comments: monitor BP with mobility, residual left sided deficits Restrictions Weight Bearing Restrictions: No    Mobility  Bed Mobility Overal bed mobility: Needs Assistance Bed Mobility: Supine to Sit;Sit to Supine     Supine to sit: Supervision Sit to supine: Supervision   General bed mobility comments: supervision for safety, pt with fall OOB on 9/4    Transfers Overall transfer level: Needs assistance Equipment used: 1 person hand held assist Transfers: Sit to/from Stand Sit to Stand: Min guard          General transfer comment: minG to steady, pt with no LOB on initial stand. no dizziness at this time  Ambulation/Gait Ambulation/Gait assistance: Min assist Gait Distance (Feet): 120 Feet Assistive device: IV Pole Gait Pattern/deviations: Step-through pattern;Decreased stride length Gait velocity: decreased   General Gait Details: minG for safety, pt able to steady with single UE support on IV pole, discussed trial of cane       Balance Overall balance assessment: Mild deficits observed, not formally tested                                          Cognition Arousal/Alertness: Awake/alert Behavior During Therapy: WFL for tasks assessed/performed Overall Cognitive Status: Within Functional Limits for tasks assessed                                           General Comments General comments (skin integrity, edema, etc.): VSS on RA      Pertinent Vitals/Pain Pain Assessment: Faces Faces Pain Scale: Hurts a little bit Pain Location: tail bone, back Pain Descriptors / Indicators: Discomfort Pain Intervention(s): Limited activity within patient's tolerance;Monitored during session;Patient requesting pain meds-RN notified     PT Goals (current goals can now be found in the care plan section) Acute Rehab PT Goals Patient Stated Goal: "to get away from using walker before going home" PT  Goal Formulation: With patient Time For Goal Achievement: 12/11/20 Potential to Achieve Goals: Good Progress towards PT goals: Progressing toward goals    Frequency    Min 3X/week      PT Plan Current plan remains appropriate       AM-PAC PT "6 Clicks" Mobility   Outcome Measure  Help needed turning from your back to your side while in a flat bed without using bedrails?: A Little Help needed moving from lying on your back to sitting on the side of a flat bed without using bedrails?: A Little Help needed moving to and from a bed to a chair  (including a wheelchair)?: A Little Help needed standing up from a chair using your arms (e.g., wheelchair or bedside chair)?: A Little Help needed to walk in hospital room?: A Lot Help needed climbing 3-5 steps with a railing? : A Lot 6 Click Score: 16    End of Session Equipment Utilized During Treatment: Gait belt Activity Tolerance: Patient tolerated treatment well Patient left: in bed;with call bell/phone within reach;with bed alarm set Nurse Communication: Mobility status;Patient requests pain meds PT Visit Diagnosis: Difficulty in walking, not elsewhere classified (R26.2);Unsteadiness on feet (R26.81)     Time: 8127-5170 PT Time Calculation (min) (ACUTE ONLY): 26 min  Charges:  $Gait Training: 8-22 mins $Therapeutic Activity: 8-22 mins                     West Carbo, PT, DPT   Acute Rehabilitation Department Pager #: [phone]   Sandra Cockayne 11/30/2020, 1:09 PM

## 2020-11-30 NOTE — Progress Notes (Addendum)
Advanced Heart Failure Rounding Note  PCP-Cardiologist: None   Subjective:    On dual inotropes, Milrinone 0.25+ DBA 2.5. Co-ox 76%. Sinus tach 120s on tele.   -3.9L in UOP yesterday. CVP 11. SCr 2.6>>2.1. HFTs pending.   BPs remain elevated.   Feels "ok" today. Denies dyspnea. Continues w/ abdominal pain. Didn't sleep well last night due to pain.   Objective:   Weight Range: 57.3 kg Body mass index is 27.34 kg/m.   Vital Signs:   Temp:  [97.1 F (36.2 C)-98.5 F (36.9 C)] 98.5 F (36.9 C) (09/05 0750) Pulse Rate:  [87-125] 125 (09/05 0745) Resp:  [13-30] 21 (09/05 0745) BP: (104-171)/(59-115) 142/92 (09/05 0745) SpO2:  [93 %-100 %] 98 % (09/05 0745) Weight:  [57.3 kg] 57.3 kg (09/05 0500) Last BM Date: 11/30/20  Weight change: Filed Weights   11/23/20 0355 11/27/20 2150 11/30/20 0500  Weight: 55.8 kg 59 kg 57.3 kg    Intake/Output:   Intake/Output Summary (Last 24 hours) at 11/30/2020 0813 Last data filed at 11/30/2020 0700 Gross per 24 hour  Intake 329.13 ml  Output 3850 ml  Net -3520.87 ml      Physical Exam    CVP 11  General:  chronically ill appearing young female.  No respiratory difficulty HEENT: normal Neck: supple. JVD not well visualized Carotids 2+ bilat; no bruits. No lymphadenopathy or thyromegaly appreciated. Cor: PMI nondisplaced. Regular rhythm tachy rate. No rubs, gallops or murmurs. Lungs: clear Abdomen: soft, nontender, nondistended. No hepatosplenomegaly. No bruits or masses. Good bowel sounds. Extremities: no cyanosis, clubbing, rash, edema Neuro: alert & oriented x 3, cranial nerves grossly intact. moves all 4 extremities w/o difficulty. Affect pleasant.  Telemetry   Sinus tach 120s Personally reviewed   EKG    No new EKG to review   Labs    CBC Recent Labs    11/28/20 2256 11/29/20 0101 11/30/20 0211  WBC 11.5* 11.0* 8.4  NEUTROABS 9.4* 8.8*  --   HGB 9.3* 9.6* 10.2*  HCT 28.7* 29.6* 30.4*  MCV 87.0 87.3 87.6   PLT 91* 95* 098*   Basic Metabolic Panel Recent Labs    11/28/20 0218 11/28/20 1129 11/29/20 2145 11/30/20 0211  NA 126*   < > 134* 134*  K 4.7   < > 3.6 3.8  CL 98   < > 100 100  CO2 16*   < > 19* 17*  GLUCOSE 135*   < > 287* 228*  BUN 67*   < > 51* 47*  CREATININE 3.75*   < > 2.55* 2.12*  CALCIUM 8.1*   < > 8.1* 7.9*  MG 2.6*  --   --  2.0  PHOS  --   --   --  2.9   < > = values in this interval not displayed.   Liver Function Tests Recent Labs    11/28/20 1816 11/29/20 0614  AST 2,519* 1,252*  ALT 1,914* 1,671*  ALKPHOS 105 124  BILITOT 2.5* 2.6*  PROT 6.5 6.0*  ALBUMIN 3.3* 2.9*   Recent Labs    11/27/20 1038  LIPASE 17   Cardiac Enzymes No results for input(s): CKTOTAL, CKMB, CKMBINDEX, TROPONINI in the last 72 hours.  BNP: BNP (last 3 results) Recent Labs    11/23/20 0224 11/28/20 1511  BNP 48.9 529.6*    ProBNP (last 3 results) No results for input(s): PROBNP in the last 8760 hours.   D-Dimer No results for input(s): DDIMER in the last 72  hours. Hemoglobin A1C No results for input(s): HGBA1C in the last 72 hours. Fasting Lipid Panel Recent Labs    11/29/20 2145 11/30/20 0211  CHOL 423*  --   HDL NOT REPORTED DUE TO HIGH TRIGLYCERIDES  --   LDLCALC UNABLE TO CALCULATE IF TRIGLYCERIDE OVER 400 mg/dL  --   TRIG 1,801* 1,518*  CHOLHDL NOT REPORTED DUE TO HIGH TRIGLYCERIDES  --   LDLDIRECT UNABLE TO CALCULATE IF TRIGLYCERIDE IS >1293 mg/dL  --    Thyroid Function Tests Recent Labs    11/28/20 1511  TSH 0.098*    Other results:   Imaging    DG Lumbar Spine 2-3 Views  Result Date: 11/29/2020 CLINICAL DATA:  Abdominal pain, previous fall EXAM: LUMBAR SPINE - 2-3 VIEW COMPARISON:  CT 11/24/2020 FINDINGS: There is no evidence of lumbar spine fracture. Alignment is normal. Intervertebral disc spaces are maintained. IMPRESSION: Negative. Electronically Signed   By: Lucrezia Europe M.D.   On: 11/29/2020 12:48   DG Sacrum/Coccyx  Result  Date: 11/29/2020 CLINICAL DATA:  Abdominal pain, previous fall EXAM: SACRUM AND COCCYX - 2+ VIEW COMPARISON:  None. FINDINGS: There is no evidence of fracture or other focal bone lesions. IMPRESSION: Negative. Electronically Signed   By: Lucrezia Europe M.D.   On: 11/29/2020 12:51   DG CHEST PORT 1 VIEW  Result Date: 11/29/2020 CLINICAL DATA:  Status post central line placement. EXAM: PORTABLE CHEST 1 VIEW COMPARISON:  Chest radiograph dated 11/23/2020. FINDINGS: The heart is enlarged. Minimal diffuse bilateral interstitial opacities may represent pulmonary edema. There is no pleural effusion or pneumothorax. A right internal jugular central venous catheter tip overlies the right atrium. IMPRESSION: Right internal jugular central venous catheter tip overlies the right atrium. No pneumothorax. Electronically Signed   By: Zerita Boers M.D.   On: 11/29/2020 12:01     Medications:     Scheduled Medications:  Chlorhexidine Gluconate Cloth  6 each Topical Daily   DULoxetine  60 mg Oral Daily   famotidine  10 mg Oral Daily   fenofibrate  160 mg Oral Daily   heparin injection (subcutaneous)  5,000 Units Subcutaneous Q8H   lactulose  20 g Oral TID   levothyroxine  125 mcg Oral Q0600   norethindrone-ethinyl estradiol  1 tablet Oral Daily   potassium chloride  40 mEq Oral Once   sodium bicarbonate  650 mg Oral TID   sodium chloride flush  10-40 mL Intracatheter Q12H   sodium chloride flush  3 mL Intravenous Q12H    Infusions:  sodium chloride     sodium chloride     dextrose 5 % and 0.9% NaCl Stopped (11/29/20 1355)   DOBUTamine 2.5 mcg/kg/min (11/30/20 0400)   insulin 4.5 Units/hr (11/30/20 0741)   milrinone 0.25 mcg/kg/min (11/30/20 0739)   norepinephrine (LEVOPHED) Adult infusion      PRN Medications: sodium chloride, diphenhydrAMINE, HYDROmorphone (DILAUDID) injection, ondansetron (ZOFRAN) IV, phenol, prochlorperazine, senna-docusate, sodium chloride flush, sodium chloride  flush  Assessment/Plan  1. Acute on chronic systolic HF -> cardiogenic shock - likely NICM - Echo 1/22: EF 35-40% Mod MR - Echo 11/28/20 20-25% with moderate RV dysfunction and mild septal flattening. Mod-severe TR - HS troponin 29 argues against acute myocarditis - on dual inotropes, Milrinone 0.25 + DBA 2.5. Co-ox 76%  - CVP 11. Continue IV lasix 40 mg once daily  - Repeat cMRI tomorrow. May need RHC - Rheum serologies pending  - Holding Entresto/ Arlyce Harman due to AKI. No beta blocker due to  shock   - w/ high BP and tachycardia, consider weaning off DBA. Continue milrinone, can titrate to 0.375 if needed.  - monitor HR w/ inotropes, may need amio gtt for rate control    2. AKI  - Due to ATN/shock  - Scr 1.57 -> 3.12 -> 3.91 -> 3.01 -> 3.11->2.6->2.1  - support with inotropes and/or pressors. Keep MAPs > 65 - Hold Entresto    3. Shock liver - continue hemodynamic support - holding statin - CT shows severe hepatic steatosis - Monitor LFTs (f/u labs pending)   4. 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 Hyponatremia - improved 130>>134 - Suspect d/t hypervolemia    6. Type I DM - per CCM  7. Fall - fell out of bed on 11/29/20 -  L-S spine films ok   Length of Stay: [address] PA-C  11/30/2020, 8:13 AM  Advanced Heart Failure Team Pager [phone] (M-F; 7a - 5p)  Please contact [facility] for night-coverage after hours (5p -7a ) and weekends on [email or web address]  Agree with above.   Remains on DBA and milrinone. Co-ox ok. Diuresed well with IV lasix but CVP still up. She remains tachy.   Slid of the end of the bed yesterday and onto the floor. Unable to get up independently. L-S spine films ok.   TGs headed back up. SCr continues to improve  General:  Sitting in chair No resp difficulty HEENT: normal R ptosis Neck: supple. no JVD. Carotids 2+ bilat; no bruits. No  lymphadenopathy or thryomegaly appreciated. Cor: PMI nondisplaced. Regular tachy  No rubs, gallops or murmurs. Lungs: clear Abdomen: soft, nontender, nondistended. No hepatosplenomegaly. No bruits or masses. Good bowel sounds. Extremities: no cyanosis, clubbing, rash, edema Neuro: alert & orientedx3, cranial nerves grossly intact. moves all 4 extremities w/o difficulty. Affect pleasant  Remains inotrope dependent. Will begin to wean DBA. Continue milrinone. If BP remains preserved and renal function continues to improve can restart Entresto and other GDMT. Await results of cMRI. CCM managing TGs. Given comorbidities unlikely to be VAD candidate but will d/w VAD team. Continue IV diuresis.   CRITICAL CARE Performed by: Glori Bickers  Total critical care time: 35 minutes  Critical care time was exclusive of separately billable procedures and treating other patients.  Critical care was necessary to treat or prevent imminent or life-threatening deterioration.  Critical care was time spent personally by me (independent of midlevel providers or residents) on the following activities: development of treatment plan with patient and/or surrogate as well as nursing, discussions with consultants, evaluation of patient's response to treatment, examination of patient, obtaining history from patient or surrogate, ordering and performing treatments and interventions, ordering and review of laboratory studies, ordering and review of radiographic studies, pulse oximetry and re-evaluation of patient's condition.  Glori Bickers, MD  9:07 AM

## 2020-11-30 NOTE — Progress Notes (Signed)
[facility] PROGRESS NOTE  Assessment/ Plan: Pt is a 30 y.o. yo female with CHF, cardiogenic shock, shock liver, consulted for AKI.  #Acute kidney injury, nonoliguric: Due to cardiorenal syndrome concomitant with the use of Entresto, IV contrast, ibuprofen.  UA unremarkable and CT scan without evidence of hydronephrosis.  With the help of inotropes patient has increased urine output and renal function is improving.  [name] clinically looks good.  Recommend daily ins and out, lab.  Avoid nephrotoxins or IV contrast.  #Acute on chronic systolic CHF/cardiogenic shock: Nonischemic cardiomyopathy with EF down to 20-25%.  Currently on dobutamine and IV diuretics per cardiology.  Recommend to hold Entresto given AKI.  #Hyponatremia, hypervolemic: Improving with diuretics.  #Anion gap metabolic acidosis: Continue sodium bicarbonate.  Monitor CO2 level.    #Anemia: Hemoglobin 10.2.  #Shock liver: Trend liver enzyme.  Renal function gradually improving.  Diuretics per cardiology.  I will sign off, please call us back with question.  Discussed with the primary team.  Subjective: Seen and examined.  Patient reports feeling much better.  Denies nausea vomiting chest pain shortness of breath.  Urine output is around 3.4 L. Objective Vital signs in last 24 hours: Vitals:   11/30/20 0515 11/30/20 0530 11/30/20 0545 11/30/20 0600  BP: (!) 149/70 (!) 165/85 (!) 158/78 (!) 152/83  Pulse: (!) 110 (!) 114 (!) 111 (!) 117  Resp: (!) 26 (!) 24 (!) 27 (!) 25  Temp:      TempSrc:      SpO2: 97% 93% 96% 96%  Weight:      Height:       Weight change:   Intake/Output Summary (Last 24 hours) at 11/30/2020 0742 Last data filed at 11/30/2020 0400 Gross per 24 hour  Intake 329.13 ml  Output 3400 ml  Net -3070.87 ml       Labs: Basic Metabolic Panel: Recent Labs  Lab 11/29/20 1330 11/29/20 2145 11/30/20 0211  NA 133* 134* 134*  K 4.0 3.6 3.8  CL 104 100 100  CO2 19*  19* 17*  GLUCOSE 213* 287* 228*  BUN 54* 51* 47*  CREATININE 2.57* 2.55* 2.12*  CALCIUM 7.8* 8.1* 7.9*  PHOS  --   --  2.9   Consulted for AKI.  Function Tests: Recent Labs  Lab 11/28/20 1511 11/28/20 1816 11/29/20 0614  AST 2,837* 2,519* 1,252*  ALT 2,075* 1,914* 1,671*  ALKPHOS 109 105 124  BILITOT 2.6* 2.5* 2.6*  PROT 6.6 6.5 6.0*  ALBUMIN 3.5 3.3* 2.9*   Recent Labs  Lab 11/25/20 1709 11/27/20 1038  LIPASE 21 17   Recent Labs  Lab 11/28/20 1511  AMMONIA 67*   CBC: Recent Labs  Lab 11/27/20 0716 11/28/20 0218 11/28/20 2256 11/29/20 0101 11/30/20 0211  WBC 10.7* 9.8 11.5* 11.0* 8.4  NEUTROABS 7.3 6.7 9.4* 8.8*  --   HGB 10.9* 10.2* 9.3* 9.6* 10.2*  HCT 33.4* 32.2* 28.7* 29.6* 30.4*  MCV 85.9 89.0 87.0 87.3 87.6  PLT 156 119* 91* 95* 123*   Cardiac Enzymes: No results for input(s): CKTOTAL, CKMB, CKMBINDEX, TROPONINI in the last 168 hours. CBG: Recent Labs  Lab 11/30/20 0100 11/30/20 0225 11/30/20 0420 11/30/20 0603 11/30/20 0646  GLUCAP 229* 212* 178* 126* 111*    Iron Studies: No results for input(s): IRON, TIBC, TRANSFERRIN, FERRITIN in the last 72 hours. Studies/Results: DG Lumbar Spine 2-3 Views  Result Date: 11/29/2020 CLINICAL DATA:  Abdominal pain, previous fall EXAM: LUMBAR SPINE - 2-3 VIEW COMPARISON:  CT 11/24/2020 FINDINGS: There is no evidence of lumbar spine fracture. Alignment is normal. Intervertebral disc spaces are maintained. IMPRESSION: Negative. Electronically Signed   By: Lucrezia Europe M.D.   On: 11/29/2020 12:48   DG Sacrum/Coccyx  Result Date: 11/29/2020 CLINICAL DATA:  Abdominal pain, previous fall EXAM: SACRUM AND COCCYX - 2+ VIEW COMPARISON:  None. FINDINGS: There is no evidence of fracture or other focal bone lesions. IMPRESSION: Negative. Electronically Signed   By: Lucrezia Europe M.D.   On: 11/29/2020 12:51   US Abdomen Limited  Result Date: 11/28/2020 CLINICAL DATA:  Elevated LFTs. EXAM: ULTRASOUND ABDOMEN LIMITED RIGHT  UPPER QUADRANT COMPARISON:  Ultrasound 04/18/2020. Abdomen pelvis CT 11/22/2020, CT 11/24/2020 FINDINGS: Gallbladder: Partially distended. Borderline wall thickening of 3 mm. Small volume pericholecystic fluid. No gallstones. Positive sonographic Murphy sign noted by sonographer. Common bile duct: Diameter: 5-6 mm. Liver: Heterogeneous and diffusely increased in parenchymal echogenicity. The liver parenchyma is difficult to penetrate. Less echogenic parenchyma adjacent to the gallbladder fossa may be related to edema or focal fatty sparing. No capsular nodularity. Portal vein is patent on color Doppler imaging with normal direction of blood flow towards the liver. Other: Trace free fluid adjacent to the liver. IMPRESSION: 1. Heterogeneous increased hepatic echogenicity consistent with steatosis. 2. No gallstones, however there is equivocal gallbladder wall thickening with small amount of pericholecystic fluid and a positive sonographic Murphy sign reported by the sonographer. This may be reactive due to liver disease, however possibility of acalculous cholecystitis is raised. As clinically indicated, consider nuclear medicine hepatobiliary scan. 3. No biliary dilatation. Electronically Signed   By: Keith Rake M.D.   On: 11/28/2020 23:47   DG CHEST PORT 1 VIEW  Result Date: 11/29/2020 CLINICAL DATA:  Status post central line placement. EXAM: PORTABLE CHEST 1 VIEW COMPARISON:  Chest radiograph dated 11/23/2020. FINDINGS: The heart is enlarged. Minimal diffuse bilateral interstitial opacities may represent pulmonary edema. There is no pleural effusion or pneumothorax. A right internal jugular central venous catheter tip overlies the right atrium. IMPRESSION: Right internal jugular central venous catheter tip overlies the right atrium. No pneumothorax. Electronically Signed   By: Zerita Boers M.D.   On: 11/29/2020 12:01   ECHOCARDIOGRAM COMPLETE  Result Date: 11/28/2020    ECHOCARDIOGRAM REPORT   Patient  Name:   Jennifer [name] Date of Exam: 11/28/2020 Medical Rec #:  [id number]         Height:       57.0 in Accession #:    [id number]        Weight:       130.1 lb Date of Birth:  08/06/1990         BSA:          1.498 m Patient Age:    30 years          BP:           112/81 mmHg Patient Gender: F                 HR:           80 bpm. Exam Location:  Inpatient Procedure: 2D Echo, Cardiac Doppler and Color Doppler Indications:    CHF  History:        Patient has prior history of Echocardiogram examinations, most                 recent 04/15/2019.  Sonographer:    Luisa Hart RDCS Referring Phys: Correll  1.  Left ventricular ejection fraction, by estimation, is 25 to 30%. The left ventricle has severely decreased function. The left ventricle demonstrates global hypokinesis. Left ventricular diastolic parameters are consistent with Grade II diastolic dysfunction (pseudonormalization). Elevated left atrial pressure. There is the interventricular septum is flattened in diastole ('D' shaped left ventricle), consistent with right ventricular volume overload.  2. Right ventricular systolic function is mildly reduced. The right ventricular size is normal. There is mildly elevated pulmonary artery systolic pressure.  3. The mitral valve is normal in structure. Mild to moderate mitral valve regurgitation.  4. Tricuspid valve regurgitation is moderate to severe.  5. The aortic valve is normal in structure. Aortic valve regurgitation is mild. FINDINGS  Left Ventricle: Left ventricular ejection fraction, by estimation, is 25 to 30%. The left ventricle has severely decreased function. The left ventricle demonstrates global hypokinesis. The left ventricular internal cavity size was normal in size. There is no left ventricular hypertrophy. The interventricular septum is flattened in diastole ('D' shaped left ventricle), consistent with right ventricular volume overload. Left ventricular diastolic parameters are  consistent with Grade II diastolic dysfunction (pseudonormalization). Elevated left atrial pressure. Right Ventricle: The right ventricular size is normal. No increase in right ventricular wall thickness. Right ventricular systolic function is mildly reduced. There is mildly elevated pulmonary artery systolic pressure. The tricuspid regurgitant velocity  is 2.96 m/s, and with an assumed right atrial pressure of 3 mmHg, the estimated right ventricular systolic pressure is 71.6 mmHg. Left Atrium: Left atrial size was normal in size. Right Atrium: Right atrial size was normal in size. Pericardium: There is no evidence of pericardial effusion. Mitral Valve: The mitral valve is normal in structure. Mild to moderate mitral valve regurgitation, with centrally-directed jet. Tricuspid Valve: The tricuspid valve is normal in structure. Tricuspid valve regurgitation is moderate to severe. Aortic Valve: The aortic valve is normal in structure. Aortic valve regurgitation is mild. Aortic regurgitation PHT measures 367 msec. Aortic valve mean gradient measures 3.0 mmHg. Aortic valve peak gradient measures 4.8 mmHg. Aortic valve area, by VTI measures 1.93 cm. Pulmonic Valve: The pulmonic valve was normal in structure. Pulmonic valve regurgitation is trivial. Aorta: The aortic root was not well visualized. IAS/Shunts: No atrial level shunt detected by color flow Doppler.  LEFT VENTRICLE PLAX 2D LVIDd:         4.40 cm     Diastology LVIDs:         3.80 cm     LV e' medial:    5.11 cm/s LV PW:         0.90 cm     LV E/e' medial:  17.8 LV IVS:        1.00 cm     LV e' lateral:   9.03 cm/s LVOT diam:     1.80 cm     LV E/e' lateral: 10.1 LV SV:         37 LV SV Index:   25 LVOT Area:     2.54 cm  LV Volumes (MOD) LV vol d, MOD A4C: 70.3 ml LV vol s, MOD A4C: 51.3 ml LV SV MOD A4C:     70.3 ml RIGHT VENTRICLE RV S prime:     9.36 cm/s TAPSE (M-mode): 1.3 cm LEFT ATRIUM             Index       RIGHT ATRIUM           Index LA diam:  3.30 cm 2.20 cm/m  RA Area:     11.20 cm LA Vol (A2C):   27.7 ml 18.49 ml/m RA Volume:   21.70 ml  14.49 ml/m LA Vol (A4C):   22.8 ml 15.22 ml/m LA Biplane Vol: 25.3 ml 16.89 ml/m  AORTIC VALVE                   PULMONIC VALVE AV Area (Vmax):    2.18 cm    PV Vmax:          0.84 m/s AV Area (Vmean):   2.04 cm    PV Vmean:         57.000 cm/s AV Area (VTI):     1.93 cm    PV VTI:           0.140 m AV Vmax:           109.00 cm/s PV Peak grad:     2.8 mmHg AV Vmean:          73.900 cm/s PV Mean grad:     2.0 mmHg AV VTI:            0.191 m     PR End Diast Vel: 2.92 msec AV Peak Grad:      4.8 mmHg AV Mean Grad:      3.0 mmHg LVOT Vmax:         93.50 cm/s LVOT Vmean:        59.100 cm/s LVOT VTI:          0.145 m LVOT/AV VTI ratio: 0.76 AI PHT:            367 msec AR Vena Contracta: 0.20 cm  AORTA Ao Root diam: 2.20 cm Ao Asc diam:  2.50 cm MITRAL VALVE                TRICUSPID VALVE MV Area (PHT): 6.27 cm     TR Peak grad:   35.0 mmHg MV Decel Time: 121 msec     TR Vmax:        296.00 cm/s MR Vena Contracta: 0.30 cm MR PISA:           1.01 cm SHUNTS MR PISA Radius:    0.40 cm  Systemic VTI:  0.14 m MV E velocity: 90.80 cm/s   Systemic Diam: 1.80 cm MV A velocity: 32.30 cm/s MV E/A ratio:  2.81 Mihai Croitoru MD Electronically signed by Sanda Klein MD Signature Date/Time: 11/28/2020/1:16:35 PM    Final    [place] EKG SITE RITE  Result Date: 11/28/2020 If Site Rite image not attached, placement could not be confirmed due to current cardiac rhythm.   Medications: Infusions:  sodium chloride     sodium chloride     dextrose 5 % and 0.9% NaCl Stopped (11/29/20 1355)   DOBUTamine 2.5 mcg/kg/min (11/30/20 0400)   insulin 4.5 Units/hr (11/30/20 0741)   milrinone 0.25 mcg/kg/min (11/30/20 0739)   norepinephrine (LEVOPHED) Adult infusion      Scheduled Medications:  Chlorhexidine Gluconate Cloth  6 each Topical Daily   DULoxetine  60 mg Oral Daily   famotidine  10 mg Oral Daily   fenofibrate  160 mg  Oral Daily   heparin injection (subcutaneous)  5,000 Units Subcutaneous Q8H   lactulose  20 g Oral TID   levothyroxine  125 mcg Oral Q0600   norethindrone-ethinyl estradiol  1 tablet Oral Daily   potassium chloride  40 mEq Oral Once   sodium  bicarbonate  650 mg Oral TID   sodium chloride flush  10-40 mL Intracatheter Q12H   sodium chloride flush  3 mL Intravenous Q12H    have reviewed scheduled and prn medications.  Physical Exam: General:NAD, comfortable Heart:RRR, s1s2 nl Lungs:clear b/l, no crackle Abdomen:soft, Non-tender, non-distended Extremities:No edema Neurology: Alert, awake, nonfocal  Tabbitha Janvrin Tanna Furry 11/30/2020,7:42 AM  LOS: 7 days

## 2020-11-30 NOTE — Progress Notes (Signed)
Initial Nutrition Assessment  DOCUMENTATION CODES:   Not applicable  INTERVENTION:   Recommend resuming PERT therapy, pt reports she takes Creon as outpt; discussed with MD. May benefit from resuming bentyl if experiences IBS symptoms.   Add MVI with Minerals  Initiated diet education   NUTRITION DIAGNOSIS:   Increased nutrient needs related to acute illness, chronic illness as evidenced by estimated needs.   GOAL:   Patient will meet greater than or equal to 90% of their needs  MONITOR:   PO intake, Labs, Weight trends  REASON FOR ASSESSMENT:   Consult Diet education, Assessment of nutrition requirement/status  ASSESSMENT:   30 yo admitted with acute on chronic CHF with cardiogenic shock, shock liver, AKI, severe hyperTG with hx of LPL deficiency. PMH includes type 1 DM, chronic pancreatitis, astrocytoma s/p surgery with L-sided deficits, NAFLD  9/02 ECHO LVEF 20-25%  Pt currently on insulin drip, trending TG.   Pt reports she has been dealing with hyperTG for a long time, both of her parents had this and pt reports she had her first bout with pancreatitis when she was 19  Appetite ok; recorded po intake 50-100% of meals.   Pt lives at home with her husband; she does the grocery shopping and the cooking Pt reports she typically eats 3 meals per day at home. 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 Breakfast used to be cereal with milk but now she usually just does a piece of fruit. Dinner is usually lean protein like chicken/ground chicken with a starch (rice, pasta) and veggie. Lunch is the same, usually leftovers from the night before. Pt sometimes has a snack; if she does this is usually fruit. Pt reports she does not eat out and does not eat fried food. She does not use the salt shaker. Pt does not take protein supplements but does take a MVI.   Reinforced importance of having a protein source with her fruit at breakfast and as  snacks; this is important with regards to CBG management but also with regards to her overall nutrition.   Pt reports no recent wt loss, +weight gain due to fluid. Current wt 126 pounds; reports UBW around 119. Pt with non pitting edema present on exam  Pt reports IBS, reports 10-12 loose stools per day Pt reports she takes Creon at home, 2 pills at meals, 1 pill with snacks but does not know the dosage. Not currently ordered, recommend resuming PERT at this time  Pt with residual L sided deficits; muscle wasting much more noticeable on L than R during physical exam.  Pt reports she is very weak; noted pt fell out of bed yesterday. PT working with patient.   Pt reports she has seen multiple RDs in the past, she follows and lower fat, low sodium diet.   Labs: TG 1531, cholesterol 413, CBGs 64-212-insulin drip with D5 for high TG Meds D5-NS at 50 ml/hr, fenofibrate, insulin gtt, compazine, sodium bicarb,     Diet Order:   Diet Order             Diet Heart Room service appropriate? Yes; Fluid consistency: Thin  Diet effective now                   EDUCATION NEEDS:   Education needs have been addressed  Skin:  Skin Assessment: Reviewed RN Assessment  Last BM:  9/5  Height:   Ht Readings from Last 1  Encounters:  11/27/20 4' 9"  (1.448 m)    Weight:   Wt Readings from Last 1 Encounters:  11/30/20 57.3 kg    BMI:  Body mass index is 27.34 kg/m.  Estimated Nutritional Needs:   Kcal:  1600-1900 kcals  Protein:  80-95 g  Fluid:  >/= 1.5 L   Kerman Passey MS, RDN, LDN, CNSC Registered Dietitian III Clinical Nutrition RD Pager and On-Call Pager Number Located in [place]

## 2020-11-30 NOTE — Progress Notes (Signed)
NAME:  Jennifer [name], MRN:  [id number], DOB:  [birth year], LOS: 7 ADMISSION DATE:  11/22/2020, CONSULTATION DATE:  9/3 REFERRING MD:  Sabino Gasser MD, Reason for consult:  decompensated heart failure   History of Present Illness:  Jennifer [name] is a  30 y.o. F who is seen in consultation at the request of Dr. Sabino Gasser for recommendations on further evaluation and management of decompensated heart failure. The patient presented to [facility] ED  on 8/28 via with complaints of abdominal pain and diarrhea. The patient was admitted to the [facility] hospitalist service.  She presented to the ED after two weeks of worsening abdominal pain and diarrhea. Her workup in the [facility] ED did not demonstrate pancreatitis on CT abdomen. There was some concern for vasculitis for which she was seen by vascular surgery with recommendations for outpatient follow up.   Her TG were found to be  4030 on 8/31. She has a history of LPL deficiency and follows with [facility] endocrinology with Dr. Garrel Ridgel. She was started on an insulin/dextrose infusion on 9/3. TG [phone]. Lipase was remained WNL  Cardiology and Nephrology were consulted on 9/3 after her creatinine continued to rise and she began developing hypotension. EF was found to be 25-30% on ECHO with elevate LAP and RV volume overload. Cardiology has recommended the patient be transferred to [facility] to be started on NE and dobutamine.   Pertinent  Medical History  Astrocytoma, DMII, NAFLD, LPL deficiency, HTN, hypothyroidis, PCOS, CHF, chronic pancreatitis  Significant Hospital Events: Including procedures, antibiotic start and stop dates in addition to other pertinent events   8/28 Admit, CT abd pelvis no pancreatitis, concern for vasculitis. Seen by VS. Needs outpatient follow up. 8/30 CTA abd pelvis: no evidence of mesenteric ischemia. Un remarkable pancreas 9/2 Cards and Renal consulted. ECHO. LVEF 25-30%. LV severely decreased function. Grade II diastolic dysfunction. RVSF mild  reduced. Moderate to severe TR. TXR requested to [facility].   Interim History / Subjective:  No events, abdominal pain improved.  Excellent urine output.  Objective   Blood pressure (!) 152/83, pulse (!) 117, temperature 97.6 F (36.4 C), temperature source Oral, resp. rate (!) 25, height 4' 9"  (1.448 m), weight 57.3 kg, last menstrual period 11/19/2020, SpO2 96 %. On RA        Intake/Output Summary (Last 24 hours) at 11/30/2020 0867 Last data filed at 11/30/2020 0400 Gross per 24 hour  Intake 329.13 ml  Output 3400 ml  Net -3070.87 ml    Filed Weights   11/23/20 0355 11/27/20 2150 11/30/20 0500  Weight: 55.8 kg 59 kg 57.3 kg    Examination: No distress R ptosis Abdomen soft Lungs with crackles at bases Ext warm  Cr improved SvO2 improved On dobutamine/milrinone  Resolved Hospital Problem list     Assessment & Plan:   Multifactorial acute liver injury (statin, CHF, pancreatitis)- improving Similar multifactorial nonoliguric renal failure- improving Cardiogenic shock- improving TG induced acute on chronic pancreatitis Hx astrocytoma post resection, mild residual L weakness HTN DM2 with hyperglycemia TSH low, T4 normal Hx unspecified abdominal surgery  - Continue high dose insulin euglycemic protocol > 0.05 units/kg/hr titrating to CBG 100-180, TG will come down with this - Will work with pharmacists to add additional PO meds to reduce TG so we can get her off insulin gtt; may need to reach out to her endocrinologist Tuesday - Inotrope and diuretic push - Trend liver/renal indices, avoid toxic meds; I think we can restart statin if AST < 1000 as acute  liver injury likely related to congestion given marked response to diuresis - Heading in right direction, hopefully we can start weaning inotropes today, will discuss with CHF team  Best Practice (right click and "Reselect all SmartList Selections" daily)   Diet/type: low fat DVT prophylaxis: prophylactic heparin  GI  prophylaxis: H2B Lines: N/A, plan to place central line. Foley:  N/A Code Status:  full code Last date of multidisciplinary goals of care discussion [n/a]  Patient critically ill due to shock Interventions to address this today inotrope, insulin titration Risk of deterioration without these interventions is high  I personally spent 35 minutes providing critical care not including any separately billable procedures  Erskine Emery MD [facility]  Prefer epic messenger for cross cover needs If after hours, please call E-link

## 2020-12-01 ENCOUNTER — Inpatient Hospital Stay (HOSPITAL_COMMUNITY): Payer: BC Managed Care – PPO

## 2020-12-01 DIAGNOSIS — I428 Other cardiomyopathies: Secondary | ICD-10-CM | POA: Diagnosis not present

## 2020-12-01 DIAGNOSIS — E1059 Type 1 diabetes mellitus with other circulatory complications: Secondary | ICD-10-CM

## 2020-12-01 DIAGNOSIS — K7200 Acute and subacute hepatic failure without coma: Secondary | ICD-10-CM

## 2020-12-01 DIAGNOSIS — N179 Acute kidney failure, unspecified: Secondary | ICD-10-CM | POA: Diagnosis not present

## 2020-12-01 DIAGNOSIS — I509 Heart failure, unspecified: Secondary | ICD-10-CM | POA: Diagnosis not present

## 2020-12-01 DIAGNOSIS — I776 Arteritis, unspecified: Secondary | ICD-10-CM

## 2020-12-01 DIAGNOSIS — E039 Hypothyroidism, unspecified: Secondary | ICD-10-CM

## 2020-12-01 DIAGNOSIS — R570 Cardiogenic shock: Secondary | ICD-10-CM | POA: Diagnosis not present

## 2020-12-01 DIAGNOSIS — R197 Diarrhea, unspecified: Secondary | ICD-10-CM

## 2020-12-01 LAB — CBC
HCT: 34.4 % — ABNORMAL LOW (ref 36.0–46.0)
Hemoglobin: 11.4 g/dL — ABNORMAL LOW (ref 12.0–15.0)
MCH: 28.6 pg (ref 26.0–34.0)
MCHC: 33.1 g/dL (ref 30.0–36.0)
MCV: 86.4 fL (ref 80.0–100.0)
Platelets: 134 10*3/uL — ABNORMAL LOW (ref 150–400)
RBC: 3.98 MIL/uL (ref 3.87–5.11)
RDW: 16.4 % — ABNORMAL HIGH (ref 11.5–15.5)
WBC: 7.8 10*3/uL (ref 4.0–10.5)
nRBC: 0 % (ref 0.0–0.2)

## 2020-12-01 LAB — LIPID PANEL
Cholesterol: 455 mg/dL — ABNORMAL HIGH (ref 0–200)
Cholesterol: 512 mg/dL — ABNORMAL HIGH (ref 0–200)
Cholesterol: 514 mg/dL — ABNORMAL HIGH (ref 0–200)
HDL: 23 mg/dL — ABNORMAL LOW (ref >40)
HDL: 25 mg/dL — ABNORMAL LOW (ref >40)
HDL: 25 mg/dL — ABNORMAL LOW (ref >40)
LDL Cholesterol: UNDETERMINED mg/dL (ref 0–99)
LDL Cholesterol: UNDETERMINED mg/dL (ref 0–99)
LDL Cholesterol: UNDETERMINED mg/dL (ref 0–99)
Total CHOL/HDL Ratio: 19.8 RATIO
Total CHOL/HDL Ratio: 20.5 RATIO
Total CHOL/HDL Ratio: 20.6 RATIO
Triglycerides: 1100 mg/dL — ABNORMAL HIGH (ref <150)
Triglycerides: 981 mg/dL — ABNORMAL HIGH (ref <150)
Triglycerides: 971 mg/dL — ABNORMAL HIGH (ref <150)
VLDL: UNDETERMINED mg/dL (ref 0–40)
VLDL: UNDETERMINED mg/dL (ref 0–40)
VLDL: UNDETERMINED mg/dL (ref 0–40)

## 2020-12-01 LAB — HEPATIC FUNCTION PANEL
ALT: 1174 U/L — ABNORMAL HIGH (ref 0–44)
AST: 318 U/L — ABNORMAL HIGH (ref 15–41)
Albumin: 3.4 g/dL — ABNORMAL LOW (ref 3.5–5.0)
Alkaline Phosphatase: 180 U/L — ABNORMAL HIGH (ref 38–126)
Bilirubin, Direct: 0.6 mg/dL — ABNORMAL HIGH (ref 0.0–0.2)
Indirect Bilirubin: 0.8 mg/dL (ref 0.3–0.9)
Total Bilirubin: 1.4 mg/dL — ABNORMAL HIGH (ref 0.3–1.2)
Total Protein: 6.8 g/dL (ref 6.5–8.1)

## 2020-12-01 LAB — GLUCOSE, CAPILLARY
Glucose-Capillary: 77 mg/dL (ref 70–99)
Glucose-Capillary: 82 mg/dL (ref 70–99)
Glucose-Capillary: 68 mg/dL — ABNORMAL LOW (ref 70–99)
Glucose-Capillary: 89 mg/dL (ref 70–99)
Glucose-Capillary: 112 mg/dL — ABNORMAL HIGH (ref 70–99)
Glucose-Capillary: 164 mg/dL — ABNORMAL HIGH (ref 70–99)
Glucose-Capillary: 177 mg/dL — ABNORMAL HIGH (ref 70–99)
Glucose-Capillary: 216 mg/dL — ABNORMAL HIGH (ref 70–99)

## 2020-12-01 LAB — BASIC METABOLIC PANEL
Anion gap: 9 (ref 5–15)
BUN: 33 mg/dL — ABNORMAL HIGH (ref 6–20)
CO2: 27 mmol/L (ref 22–32)
Calcium: 8.5 mg/dL — ABNORMAL LOW (ref 8.9–10.3)
Chloride: 99 mmol/L (ref 98–111)
Creatinine, Ser: 1.36 mg/dL — ABNORMAL HIGH (ref 0.44–1.00)
GFR, Estimated: 54 mL/min — ABNORMAL LOW (ref >60)
Glucose, Bld: 75 mg/dL (ref 70–99)
Potassium: 3.6 mmol/L (ref 3.5–5.1)
Sodium: 135 mmol/L (ref 135–145)

## 2020-12-01 LAB — COOXEMETRY PANEL
Carboxyhemoglobin: 1.6 % — ABNORMAL HIGH (ref 0.5–1.5)
Methemoglobin: 1.4 % (ref 0.0–1.5)
O2 Saturation: 66.1 %
Total hemoglobin: 11.1 g/dL — ABNORMAL LOW (ref 12.0–16.0)

## 2020-12-01 LAB — LDL CHOLESTEROL, DIRECT
Direct LDL: 174.5 mg/dL — ABNORMAL HIGH (ref 0–99)
Direct LDL: 198.8 mg/dL — ABNORMAL HIGH (ref 0–99)
Direct LDL: 227.1 mg/dL — ABNORMAL HIGH (ref 0–99)

## 2020-12-01 LAB — MAGNESIUM: Magnesium: 1.8 mg/dL (ref 1.7–2.4)

## 2020-12-01 LAB — RHEUMATOID FACTOR: Rheumatoid fact SerPl-aCnc: 15.5 IU/mL — ABNORMAL HIGH (ref <14.0)

## 2020-12-01 LAB — PHOSPHORUS: Phosphorus: 4 mg/dL (ref 2.5–4.6)

## 2020-12-01 LAB — TRIGLYCERIDES: Triglycerides: 1010 mg/dL — ABNORMAL HIGH (ref <150)

## 2020-12-01 MED ORDER — DIGOXIN 0.25 MG/ML IJ SOLN
0.1250 mg | Freq: Once | INTRAMUSCULAR | Status: AC
  Administered 2020-12-01: 0.125 mg via INTRAVENOUS
  Filled 2020-12-01: qty 2

## 2020-12-01 MED ORDER — DIGOXIN 125 MCG PO TABS
0.1250 mg | ORAL_TABLET | Freq: Every day | ORAL | Status: DC
  Administered 2020-12-02 – 2020-12-06 (×5): 0.125 mg via ORAL
  Filled 2020-12-01 (×5): qty 1

## 2020-12-01 MED ORDER — GADOBUTROL 1 MMOL/ML IV SOLN
6.0000 mL | Freq: Once | INTRAVENOUS | Status: AC | PRN
  Administered 2020-12-01: 6 mL via INTRAVENOUS

## 2020-12-01 MED ORDER — VITAMIN D 25 MCG (1000 UNIT) PO TABS
1000.0000 [IU] | ORAL_TABLET | Freq: Every day | ORAL | Status: DC
  Administered 2020-12-01 – 2020-12-06 (×6): 1000 [IU] via ORAL
  Filled 2020-12-01 (×6): qty 1

## 2020-12-01 MED ORDER — INSULIN ASPART 100 UNIT/ML IJ SOLN
10.0000 [IU] | Freq: Three times a day (TID) | INTRAMUSCULAR | Status: DC
  Administered 2020-12-03 – 2020-12-05 (×4): 10 [IU] via SUBCUTANEOUS

## 2020-12-01 MED ORDER — ROSUVASTATIN CALCIUM 20 MG PO TABS
40.0000 mg | ORAL_TABLET | Freq: Every day | ORAL | Status: DC
  Administered 2020-12-02 – 2020-12-06 (×5): 40 mg via ORAL
  Filled 2020-12-01 (×5): qty 2

## 2020-12-01 MED ORDER — MAGNESIUM SULFATE 2 GM/50ML IV SOLN
2.0000 g | Freq: Once | INTRAVENOUS | Status: AC
  Administered 2020-12-01: 2 g via INTRAVENOUS
  Filled 2020-12-01: qty 50

## 2020-12-01 MED ORDER — SACUBITRIL-VALSARTAN 49-51 MG PO TABS
1.0000 | ORAL_TABLET | Freq: Two times a day (BID) | ORAL | Status: DC
  Administered 2020-12-01 – 2020-12-02 (×2): 1 via ORAL
  Filled 2020-12-01 (×3): qty 1

## 2020-12-01 MED ORDER — INSULIN DETEMIR 100 UNIT/ML ~~LOC~~ SOLN
30.0000 [IU] | Freq: Two times a day (BID) | SUBCUTANEOUS | Status: DC
  Administered 2020-12-01 (×2): 30 [IU] via SUBCUTANEOUS
  Filled 2020-12-01 (×4): qty 0.3

## 2020-12-01 MED ORDER — SACUBITRIL-VALSARTAN 49-51 MG PO TABS
1.0000 | ORAL_TABLET | Freq: Once | ORAL | Status: AC
  Administered 2020-12-01: 1 via ORAL
  Filled 2020-12-01: qty 1

## 2020-12-01 MED ORDER — POTASSIUM CHLORIDE 10 MEQ/50ML IV SOLN
10.0000 meq | INTRAVENOUS | Status: AC
Start: 2020-12-01 — End: 2020-12-01
  Administered 2020-12-01 (×4): 10 meq via INTRAVENOUS
  Filled 2020-12-01 (×3): qty 50

## 2020-12-01 MED ORDER — INSULIN ASPART 100 UNIT/ML IJ SOLN
0.0000 [IU] | Freq: Three times a day (TID) | INTRAMUSCULAR | Status: DC
  Administered 2020-12-01 – 2020-12-03 (×5): 3 [IU] via SUBCUTANEOUS
  Administered 2020-12-03 – 2020-12-04 (×3): 5 [IU] via SUBCUTANEOUS
  Administered 2020-12-05: 15 [IU] via SUBCUTANEOUS
  Administered 2020-12-05: 5 [IU] via SUBCUTANEOUS

## 2020-12-01 MED ORDER — METOPROLOL SUCCINATE ER 50 MG PO TB24: 100.0000 mg | ORAL_TABLET | Freq: Every day | ORAL | Status: DC

## 2020-12-01 NOTE — Progress Notes (Signed)
Progress Note  Patient Name: Jennifer [name] Date of Encounter: 12/01/2020  Attending physician: Candee Furbish, MD Primary care provider: Sue Lush, PA-C Primary Cardiologist: Dr. Einar Gip Consultant:Marwin Primmer Terri Skains, DO  Subjective: Jennifer [name] is a 30 y.o. female who was seen and examined at bedside  Denies chest pain. Shortness of breath and diarrhea are improving.  Less abdominal distension. Warmer extremities  CVP trending down (59mHg), negative fluid balance LFTs and creatinine continue to improve. Patient states that she is feeling better compared to last several weeks. No family at bedside.  Case discussed and reviewed with her nurse.  Objective: Vital Signs in the last 24 hours: Temp:  [97.8 F (36.6 C)-98.6 F (37 C)] 97.8 F (36.6 C) (09/06 0749) Pulse Rate:  [105-129] 129 (09/06 0700) Resp:  [17-25] 21 (09/06 0700) BP: (107-162)/(74-103) 151/98 (09/06 0700) SpO2:  [91 %-100 %] 97 % (09/06 0700) Weight:  [54.4 kg] 54.4 kg (09/06 0500)  Intake/Output:  Intake/Output Summary (Last 24 hours) at 12/01/2020 0946 Last data filed at 12/01/2020 0802 Gross per 24 hour  Intake 689.86 ml  Output --  Net 689.86 ml    Net IO Since Admission: -518.53 mL [12/01/20 0946]  Weights:  Filed Weights   11/27/20 2150 11/30/20 0500 12/01/20 0500  Weight: 59 kg 57.3 kg 54.4 kg    Telemetry: Personally reviewed - sinus tachycardia   Physical examination: PHYSICAL EXAM: Vitals with BMI 12/01/2020 12/01/2020 12/01/2020  Height - - -  Weight - - 119 lbs 15 oz  BMI - - 292.42 Systolic [id number]  Diastolic 98 90 -  Pulse [id number]   CONSTITUTIONAL: Appears older than stated age, well-developed and well-nourished. No acute distress.  Hemodynamically stable. SKIN: Skin is warm and dry. No rash noted. No cyanosis. No pallor. No jaundice HEAD: Normocephalic and atraumatic.  EYES: No scleral icterus MOUTH/THROAT: Moist oral membranes.  NECK: No JVP. No thyromegaly noted. No  carotid bruits  LYMPHATIC: No visible cervical adenopathy.  CHEST Normal respiratory effort. No intercostal retractions  LUNGS: CTAB, no rales or stridor. No wheezes.  CARDIOVASCULAR: Tachycardia, regular, positive SW9-N9 soft systolic murmur heard over the left lower sternal border, no gallops or rubs. ABDOMINAL: Soft, less distended, nontender, decreased bowel sounds in all 4 quadrants, EXTREMITIES: Right lower extremity more swollen compared to left (chronic per patient), +trace edema bilaterally, Warm to touch.  HEMATOLOGIC: No significant bruising NEUROLOGIC: Oriented to person, place, and time.  Right proptosis.  Left-sided weakness  PSYCHIATRIC: Normal mood and affect. Normal behavior. Cooperative  Lab Results: Hematology Recent Labs  Lab 11/29/20 0101 11/30/20 0211 12/01/20 0303  WBC 11.0* 8.4 7.8  RBC 3.39* 3.47* 3.98  HGB 9.6* 10.2* 11.4*  HCT 29.6* 30.4* 34.4*  MCV 87.3 87.6 86.4  MCH 28.3 29.4 28.6  MCHC 32.4 33.6 33.1  RDW 16.5* 16.6* 16.4*  PLT 95* 123* 134*    Chemistry Recent Labs  Lab 11/29/20 0614 11/29/20 1330 11/29/20 2145 11/30/20 0211 11/30/20 0500 12/01/20 0303  NA 131*   < > 134* 134*  --  135  K 4.4   < > 3.6 3.8  --  3.6  CL 103   < > 100 100  --  99  CO2 16*   < > 19* 17*  --  27  GLUCOSE 237*   < > 287* 228*  --  75  BUN 64*   < > 51* 47*  --  33*  CREATININE 3.11*   < >  2.55* 2.12*  --  1.36*  CALCIUM 7.8*   < > 8.1* 7.9*  --  8.5*  PROT 6.0*  --   --   --  RESULTS UNAVAILABLE DUE TO INTERFERING SUBSTANCE 6.8  ALBUMIN 2.9*  --   --   --  3.2* 3.4*  AST 1,252*  --   --   --  501* 318*  ALT 1,671*  --   --   --  1,251* 1,174*  ALKPHOS 124  --   --   --  139* 180*  BILITOT 2.6*  --   --   --  1.6* 1.4*  GFRNONAA 20*   < > 25* 32*  --  54*  ANIONGAP 12   < > 15 17*  --  9   < > = values in this interval not displayed.     Cardiac Enzymes: Cardiac Panel (last 3 results) Recent Labs    11/29/20 0614  TROPONINIHS 29*    BNP (last  3 results) Recent Labs    11/23/20 0224 11/28/20 1511  BNP 48.9 529.6*    ProBNP (last 3 results) No results for input(s): PROBNP in the last 8760 hours.   DDimer No results for input(s): DDIMER in the last 168 hours.   Hemoglobin A1c:  Lab Results  Component Value Date   HGBA1C 7.8 (H) 11/23/2020   MPG 177.16 11/23/2020    TSH  Recent Labs    11/28/20 1511  TSH 0.098*    Lipid Panel     Component Value Date/Time   CHOL 455 (H) 12/01/2020 0303   TRIG 1,100 (H) 12/01/2020 0303   TRIG 1,010 (H) 12/01/2020 0303   HDL 23 (L) 12/01/2020 0303   CHOLHDL 19.8 12/01/2020 0303   VLDL UNABLE TO CALCULATE IF TRIGLYCERIDE OVER 400 mg/dL 12/01/2020 0303   LDLCALC UNABLE TO CALCULATE IF TRIGLYCERIDE OVER 400 mg/dL 12/01/2020 0303   LDLDIRECT 174.5 (H) 12/01/2020 0303    Imaging: DG Lumbar Spine 2-3 Views  Result Date: 11/29/2020 CLINICAL DATA:  Abdominal pain, previous fall EXAM: LUMBAR SPINE - 2-3 VIEW COMPARISON:  CT 11/24/2020 FINDINGS: There is no evidence of lumbar spine fracture. Alignment is normal. Intervertebral disc spaces are maintained. IMPRESSION: Negative. Electronically Signed   By: Lucrezia Europe M.D.   On: 11/29/2020 12:48   DG Sacrum/Coccyx  Result Date: 11/29/2020 CLINICAL DATA:  Abdominal pain, previous fall EXAM: SACRUM AND COCCYX - 2+ VIEW COMPARISON:  None. FINDINGS: There is no evidence of fracture or other focal bone lesions. IMPRESSION: Negative. Electronically Signed   By: Lucrezia Europe M.D.   On: 11/29/2020 12:51   DG CHEST PORT 1 VIEW  Result Date: 11/29/2020 CLINICAL DATA:  Status post central line placement. EXAM: PORTABLE CHEST 1 VIEW COMPARISON:  Chest radiograph dated 11/23/2020. FINDINGS: The heart is enlarged. Minimal diffuse bilateral interstitial opacities may represent pulmonary edema. There is no pleural effusion or pneumothorax. A right internal jugular central venous catheter tip overlies the right atrium. IMPRESSION: Right internal jugular central  venous catheter tip overlies the right atrium. No pneumothorax. Electronically Signed   By: Zerita Boers M.D.   On: 11/29/2020 12:01    Cardiac database: EKG: 11/14/2020: Sinus tachycardia, 107 bpm, poor R wave progression, without underlying injury pattern.Compared to prior ECG 10/07/2020 ventricular rate has improved.   Echocardiogram: 11/28/2020:  1. Left ventricular ejection fraction, by estimation, is 25 to 30%. The left ventricle has severely decreased function. The left ventricle demonstrates global hypokinesis. Left ventricular diastolic  parameters are  consistent with Grade II diastolic dysfunction (pseudonormalization). Elevated left atrial pressure. There is the interventricular septum is flattened in diastole ('D' shaped left ventricle), consistent with right ventricular volume overload.   2. Right ventricular systolic function is mildly reduced. The right ventricular size is normal. There is mildly elevated pulmonary artery systolic pressure.   3. The mitral valve is normal in structure. Mild to moderate mitral valve regurgitation.   4. Tricuspid valve regurgitation is moderate to severe.   5. The aortic valve is normal in structure. Aortic valve regurgitation is  mild.    Stress Testing:  Lexiscan (Walking with mod Bruce) Sestamibi Stress Test 05/08/2019: Nondiagnostic ECG stress. Myocardial perfusion is normal. LV is normal in size both in rest and stress images. Stress LV EF:  calculated at 38% but visually normal. No previous exam available for comparison. Low risk study.   Scheduled Meds:  Chlorhexidine Gluconate Cloth  6 each Topical Daily   DULoxetine  60 mg Oral Daily   famotidine  10 mg Oral Daily   fenofibrate  160 mg Oral Daily   heparin injection (subcutaneous)  5,000 Units Subcutaneous Q8H   insulin aspart  0-15 Units Subcutaneous TID WC   insulin aspart  10 Units Subcutaneous TID WC   insulin detemir  30 Units Subcutaneous BID   levothyroxine  125 mcg Oral  Q0600   lipase/protease/amylase  24,000 Units Oral TID WC   multivitamin with minerals  1 tablet Oral Daily   norethindrone-ethinyl estradiol  1 tablet Oral Daily   [START ON 12/02/2020] rosuvastatin  40 mg Oral Daily   sodium bicarbonate  650 mg Oral TID   sodium chloride flush  10-40 mL Intracatheter Q12H   sodium chloride flush  3 mL Intravenous Q12H    Continuous Infusions:  sodium chloride     sodium chloride     dextrose 5 % and 0.9% NaCl 50 mL/hr at 12/01/20 0600   insulin 4 Units/hr (12/01/20 2094)   magnesium sulfate bolus IVPB     milrinone 0.125 mcg/kg/min (12/01/20 0811)   potassium chloride      PRN Meds: sodium chloride, diphenhydrAMINE, HYDROmorphone (DILAUDID) injection, lipase/protease/amylase, ondansetron (ZOFRAN) IV, phenol, prochlorperazine, senna-docusate, sodium chloride flush, sodium chloride flush   IMPRESSION & RECOMMENDATIONS: Danya Spearman is a 30 y.o. 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  Acute on chronic systolic heart failure stage C, NYHA class II/III: Most likely secondary to nonischemic cardiomyopathy (history of chemotherapy and prior CMR findings of myocarditis back in 2019 per report). Echocardiogram 03/2020: LVEF 35-40% with moderate MR. Echocardiogram 11/28/2020: LVEF 25-30%, RV dysfunction, moderate to severe TR. Currently on milrinone. Marland Kitchen Spoke to heart failure team during morning rounds.  Plan is to initiate [facility] Patiently given Corlanor and digoxin prior to her cardiac MRI. LFTs and serum CR improving but not to baseline.  CVP improving from 14 >11>62mHg.  Co-ox 66% Trend co-oximetry and CVP Cardiac MRI today  Tentatively plan for left  and right heart catheterization later this week as hemodynamics and laboratory values allow.   Currently holding beta-blocker secondary to shock physiology - can hopefully start in the next 24-48hrs depending on her labs and hemodynamics.   Nonischemic cardiomyopathy: See above  Acute kidney injury: Most likely secondary  to ischemic ATN, polypharmacy, recent contrast exposure, cardiorenal physiology. UOP is now negative and Cr improving.  Avoid nephrotoxic agents.  Shock liver secondary to acute/chronic heart failure with reduced EF: AST and ALT improving Monitor for now.  Hypertriglyceridemia with lipoprotein lipase deficiency: TG on arrival 4040 Trending up as of this morning.  Currently on insulin drip. Management per CCM.   Hypothyroidism: Currently on Synthroid. Will defer to CCM  Type 1 diabetes mellitus with hypertriglyceridemia and circulatory complication: Most recent hemoglobin A1c 7.8. Currently on insulin drip. Monitor serum glucose.  Patient's questions and concerns were addressed to her satisfaction. She voices understanding of the instructions provided during this encounter.   This note was created using a voice recognition software as a result there may be grammatical errors inadvertently enclosed that do not reflect the nature of this encounter. Every attempt is made to correct such errors.  Total time spent : 37 minutes.   Rex Kras, [place], [facility]  Pager: [phone] Office: [phone]

## 2020-12-01 NOTE — Progress Notes (Addendum)
Advanced Heart Failure Rounding Note  PCP-Cardiologist: None   Subjective:    DBA weaned off yesterday. On Milrinone 0.25. Co-ox 66%.   UOP not charted yesterday (6 unmeasured urinary occurences). Wt down 7 lb. CVP 9. Denies dyspnea.   Scr continues to improve, 2.55>>2.12>>1.36. LFTs downtrending.    K 3.6   BPs remain elevated, 829F-621H systolic. Sinus tach 120s-130s on tele.   OOB, sitting up in chair. No complaints. Denies CP and dyspnea. Appeitite is ok. Has been ambulating w/o difficulty.     Objective:   Weight Range: 54.4 kg Body mass index is 25.95 kg/m.   Vital Signs:   Temp:  [97.8 F (36.6 C)-98.6 F (37 C)] 97.8 F (36.6 C) (09/06 0749) Pulse Rate:  [105-129] 129 (09/06 0700) Resp:  [16-25] 21 (09/06 0700) BP: (107-162)/(74-103) 151/98 (09/06 0700) SpO2:  [91 %-100 %] 97 % (09/06 0700) Weight:  [54.4 kg] 54.4 kg (09/06 0500) Last BM Date: 11/30/20  Weight change: Filed Weights   11/27/20 2150 11/30/20 0500 12/01/20 0500  Weight: 59 kg 57.3 kg 54.4 kg    Intake/Output:   Intake/Output Summary (Last 24 hours) at 12/01/2020 0752 Last data filed at 12/01/2020 0600 Gross per 24 hour  Intake 796.86 ml  Output --  Net 796.86 ml      Physical Exam   CVP 9  General:  chronically ill appearing, young WF. No respiratory difficulty HEENT: normal Neck: supple. JVD 8-9 cm. Carotids 2+ bilat; no bruits. No lymphadenopathy or thyromegaly appreciated. Cor: PMI nondisplaced. Regular rhythm, tachy rate. No rubs, gallops or murmurs. Lungs: decreased BS at the bases  Abdomen: soft, nontender, nondistended. No hepatosplenomegaly. No bruits or masses. Good bowel sounds. Extremities: no cyanosis, clubbing, rash, edema, distal extremities cool, LLE>R Neuro: alert & oriented x 3, cranial nerves grossly intact. moves all 4 extremities w/o difficulty. Affect pleasant.   Telemetry   Sinus tach 120s-130s Personally reviewed   EKG    No new EKG to review    Labs    CBC Recent Labs    11/28/20 2256 11/29/20 0101 11/30/20 0211 12/01/20 0303  WBC 11.5* 11.0* 8.4 7.8  NEUTROABS 9.4* 8.8*  --   --   HGB 9.3* 9.6* 10.2* 11.4*  HCT 28.7* 29.6* 30.4* 34.4*  MCV 87.0 87.3 87.6 86.4  PLT 91* 95* 123* 086*   Basic Metabolic Panel Recent Labs    11/30/20 0211 12/01/20 0303  NA 134* 135  K 3.8 3.6  CL 100 99  CO2 17* 27  GLUCOSE 228* 75  BUN 47* 33*  CREATININE 2.12* 1.36*  CALCIUM 7.9* 8.5*  MG 2.0 1.8  PHOS 2.9 4.0   Liver Function Tests Recent Labs    11/29/20 0614 11/30/20 0500  AST 1,252* 501*  ALT 1,671* 1,251*  ALKPHOS 124 139*  BILITOT 2.6* 1.6*  PROT 6.0* RESULTS UNAVAILABLE DUE TO INTERFERING SUBSTANCE  ALBUMIN 2.9* 3.2*   No results for input(s): LIPASE, AMYLASE in the last 72 hours.  Cardiac Enzymes No results for input(s): CKTOTAL, CKMB, CKMBINDEX, TROPONINI in the last 72 hours.  BNP: BNP (last 3 results) Recent Labs    11/23/20 0224 11/28/20 1511  BNP 48.9 529.6*    ProBNP (last 3 results) No results for input(s): PROBNP in the last 8760 hours.   D-Dimer No results for input(s): DDIMER in the last 72 hours. Hemoglobin A1C No results for input(s): HGBA1C in the last 72 hours. Fasting Lipid Panel Recent Labs    12/01/20  0303  CHOL 455*  HDL 23*  LDLCALC UNABLE TO CALCULATE IF TRIGLYCERIDE OVER 400 mg/dL  TRIG 1,100*  1,010*  CHOLHDL 19.8  LDLDIRECT 174.5*   Thyroid Function Tests Recent Labs    11/28/20 1511  TSH 0.098*    Other results:   Imaging    No results found.   Medications:     Scheduled Medications:  atorvastatin  40 mg Oral Daily   Chlorhexidine Gluconate Cloth  6 each Topical Daily   DULoxetine  60 mg Oral Daily   famotidine  10 mg Oral Daily   fenofibrate  160 mg Oral Daily   heparin injection (subcutaneous)  5,000 Units Subcutaneous Q8H   levothyroxine  125 mcg Oral Q0600   lipase/protease/amylase  24,000 Units Oral TID WC   multivitamin with  minerals  1 tablet Oral Daily   norethindrone-ethinyl estradiol  1 tablet Oral Daily   sodium bicarbonate  650 mg Oral TID   sodium chloride flush  10-40 mL Intracatheter Q12H   sodium chloride flush  3 mL Intravenous Q12H    Infusions:  sodium chloride     sodium chloride     dextrose 5 % and 0.9% NaCl 50 mL/hr at 12/01/20 0600   insulin 4 Units/hr (12/01/20 0642)   milrinone 0.25 mcg/kg/min (12/01/20 0600)   norepinephrine (LEVOPHED) Adult infusion      PRN Medications: sodium chloride, diphenhydrAMINE, HYDROmorphone (DILAUDID) injection, lipase/protease/amylase, ondansetron (ZOFRAN) IV, phenol, prochlorperazine, senna-docusate, sodium chloride flush, sodium chloride flush  Assessment/Plan   1. Acute on chronic systolic HF -> cardiogenic shock - likely NICM - Echo 1/22: EF 35-40% Mod MR - Echo 11/28/20 20-25% with moderate RV dysfunction and mild septal flattening. Mod-severe TR - HS troponin 29 argues against acute myocarditis - Rheum serologies pending  - cMRI today. May need RHC  - On Milrinone 0.25. Co-ox 66%. Wean to 0.125 today  - CVP 9. Repeat IV Lasix 40 mg x 1  - Restart Entresto now that AKI is improved  - No beta blocker due to shock   ? Corlanor for rate control    2. AKI  - Due to ATN/shock. Improving w/ inotropic support  - Scr 1.57 -> 3.12 -> 3.91 -> 3.01 -> 3.11->2.6->2.1->1.36  - follow BMP     3. Shock liver - continue hemodynamic support - CT shows severe hepatic steatosis - LFTs improving - Statin restarted per CCM    4. 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 Hyponatremia - improved 130>>134>>135 - Suspect d/t hypervolemia    6. Type I DM - per CCM - on insulin gtt   7. Fall - fell out of bed on 11/29/20 - L-S spine films ok   Length of Stay: [address], PA-C  12/01/2020, 7:52 AM  Advanced Heart Failure  Team Pager [phone] (M-F; 7a - 5p)  Please contact [facility] for night-coverage after hours (5p -7a ) and weekends on [email or web address]  Agree with above   She remains on milrinone 0.25 and DBA 2.5 Co-ox 66%. [name] hypertensive and tachycardic. CVP 9. Denies SOB, orthopnea or PND. No further falls.   General:  Lying flat in bed No resp difficulty HEENT: normal R ptosis  Neck: supple. JVP 9 Carotids 2+ bilat; no bruits. No lymphadenopathy or thryomegaly appreciated. Cor: PMI nondisplaced. Regular tachy. Lungs: clear Abdomen: soft, nontender, nondistended.  No hepatosplenomegaly. No bruits or masses. Good bowel sounds. Extremities: no cyanosis, clubbing, rash, edema L sided weakness Neuro: alert & orientedx3, cranial nerves grossly intact. moves all 4 extremities w/o difficulty. Affect pleasant  She remains quite tenuous. On dual inotropes. Will stop DBA. Continue milrinone. Continue IV diuresis. Will add dig and Entresto. Reports she has a h/o marked sinus tach (? Autonomic dysregulation)   Await MRI today. Will likely need R/L cath later in the week given. Continue IV insulin for TGs.   CRITICAL CARE Performed by: Glori Bickers  Total critical care time: 35 minutes  Critical care time was exclusive of separately billable procedures and treating other patients.  Critical care was necessary to treat or prevent imminent or life-threatening deterioration.  Critical care was time spent personally by me (independent of midlevel providers or residents) on the following activities: development of treatment plan with patient and/or surrogate as well as nursing, discussions with consultants, evaluation of patient's response to treatment, examination of patient, obtaining history from patient or surrogate, ordering and performing treatments and interventions, ordering and review of laboratory studies, ordering and review of radiographic studies, pulse oximetry and re-evaluation of patient's  condition.  Glori Bickers, MD  11:33 PM

## 2020-12-01 NOTE — Progress Notes (Signed)
NAME:  Jennifer [name], MRN:  [id number], DOB:  1991-01-17, LOS: 8 ADMISSION DATE:  11/22/2020, CONSULTATION DATE:  9/3 REFERRING MD:  Sabino Gasser MD, Reason for consult:  decompensated heart failure   History of Present Illness:  Jennifer [name] is a  30 y.o. F who is seen in consultation at the request of Dr. Sabino Gasser for recommendations on further evaluation and management of decompensated heart failure. The patient presented to [facility] ED  on 8/28 via with complaints of abdominal pain and diarrhea. The patient was admitted to the [facility] hospitalist service.  She presented to the ED after two weeks of worsening abdominal pain and diarrhea. Her workup in the [facility] ED did not demonstrate pancreatitis on CT abdomen. There was some concern for vasculitis for which she was seen by vascular surgery with recommendations for outpatient follow up.   Her TG were found to be  4030 on 8/31. She has a history of LPL deficiency and follows with [facility] endocrinology with Dr. Garrel Ridgel. She was started on an insulin/dextrose infusion on 9/3. TG [phone]. Lipase was remained WNL  Cardiology and Nephrology were consulted on 9/3 after her creatinine continued to rise and she began developing hypotension. EF was found to be 25-30% on ECHO with elevate LAP and RV volume overload. Cardiology has recommended the patient be transferred to [facility] to be started on NE and dobutamine.   Pertinent  Medical History  Astrocytoma, DMII, NAFLD, LPL deficiency, HTN, hypothyroidis, PCOS, CHF, chronic pancreatitis  Significant Hospital Events: Including procedures, antibiotic start and stop dates in addition to other pertinent events   8/28 Admit, CT abd pelvis no pancreatitis, concern for vasculitis. Seen by VS. Needs outpatient follow up. 8/30 CTA abd pelvis: no evidence of mesenteric ischemia. Un remarkable pancreas 9/2 Cards and Renal consulted. ECHO. LVEF 25-30%. LV severely decreased function. Grade II diastolic dysfunction. RVSF mild  reduced. Moderate to severe TR. TXR requested to [facility].   Interim History / Subjective:  No events. Some nausea this AM. Diarrhea eased up after lactulose Dc'd.  Objective   Blood pressure (!) 151/98, pulse (!) 129, temperature 97.8 F (36.6 C), temperature source Oral, resp. rate (!) 21, height 4' 9"  (1.448 m), weight 54.4 kg, last menstrual period 11/19/2020, SpO2 97 %. On RA        Intake/Output Summary (Last 24 hours) at 12/01/2020 0804 Last data filed at 12/01/2020 0802 Gross per 24 hour  Intake 809.86 ml  Output --  Net 809.86 ml    Filed Weights   11/27/20 2150 11/30/20 0500 12/01/20 0500  Weight: 59 kg 57.3 kg 54.4 kg    Examination: No distress R ptosis Heart tachy 130s, sinus on monitor Abdomen soft Lungs more clear today No edema  Cr improved SvO2 76>>66 On milrinone  Resolved Hospital Problem list     Assessment & Plan:   Multifactorial acute liver injury (CHF, pancreatitis)- improving Similar multifactorial nonoliguric renal failure- improving Cardiogenic shock- improving TG induced acute on chronic pancreatitis Hx astrocytoma post resection, mild residual L weakness HTN DM2 with hyperglycemia TSH low, T4 normal Hx unspecified abdominal surgery  - Transition to basal bolus insulin - Fenofibrate and statin; to bring home repatha to resume - Inotrope wean, cardiac MRI per CHF team - Restart home BB today  - Probably can resume entresto too at some point - Okay for floor once cleared by CHF team  Best Practice (right click and "Reselect all SmartList Selections" daily)   Diet/type: low fat DVT prophylaxis: prophylactic heparin  GI prophylaxis: H2B Lines: N/A, plan to place central line. Foley:  N/A Code Status:  full code Last date of multidisciplinary goals of care discussion [n/a]   Erskine Emery MD [facility] [place] epic messenger for cross cover needs If after hours, please call E-link

## 2020-12-02 DIAGNOSIS — I428 Other cardiomyopathies: Secondary | ICD-10-CM | POA: Diagnosis not present

## 2020-12-02 DIAGNOSIS — N179 Acute kidney failure, unspecified: Secondary | ICD-10-CM | POA: Diagnosis not present

## 2020-12-02 DIAGNOSIS — I509 Heart failure, unspecified: Secondary | ICD-10-CM | POA: Diagnosis not present

## 2020-12-02 DIAGNOSIS — K7200 Acute and subacute hepatic failure without coma: Secondary | ICD-10-CM | POA: Diagnosis not present

## 2020-12-02 LAB — GLUCOSE, CAPILLARY
Glucose-Capillary: 182 mg/dL — ABNORMAL HIGH (ref 70–99)
Glucose-Capillary: 188 mg/dL — ABNORMAL HIGH (ref 70–99)
Glucose-Capillary: 136 mg/dL — ABNORMAL HIGH (ref 70–99)
Glucose-Capillary: 294 mg/dL — ABNORMAL HIGH (ref 70–99)
Glucose-Capillary: 160 mg/dL — ABNORMAL HIGH (ref 70–99)
Glucose-Capillary: 122 mg/dL — ABNORMAL HIGH (ref 70–99)
Glucose-Capillary: 174 mg/dL — ABNORMAL HIGH (ref 70–99)

## 2020-12-02 LAB — BASIC METABOLIC PANEL
Anion gap: 7 (ref 5–15)
Anion gap: 9 (ref 5–15)
Anion gap: 11 (ref 5–15)
Anion gap: 9 (ref 5–15)
BUN: 30 mg/dL — ABNORMAL HIGH (ref 6–20)
BUN: 28 mg/dL — ABNORMAL HIGH (ref 6–20)
BUN: 30 mg/dL — ABNORMAL HIGH (ref 6–20)
BUN: 32 mg/dL — ABNORMAL HIGH (ref 6–20)
CO2: 26 mmol/L (ref 22–32)
CO2: 25 mmol/L (ref 22–32)
CO2: 23 mmol/L (ref 22–32)
CO2: 26 mmol/L (ref 22–32)
Calcium: 9.3 mg/dL (ref 8.9–10.3)
Calcium: 9.3 mg/dL (ref 8.9–10.3)
Calcium: 9.9 mg/dL (ref 8.9–10.3)
Calcium: 9.6 mg/dL (ref 8.9–10.3)
Chloride: 100 mmol/L (ref 98–111)
Chloride: 97 mmol/L — ABNORMAL LOW (ref 98–111)
Chloride: 98 mmol/L (ref 98–111)
Chloride: 97 mmol/L — ABNORMAL LOW (ref 98–111)
Creatinine, Ser: 1.3 mg/dL — ABNORMAL HIGH (ref 0.44–1.00)
Creatinine, Ser: 1.19 mg/dL — ABNORMAL HIGH (ref 0.44–1.00)
Creatinine, Ser: 1.27 mg/dL — ABNORMAL HIGH (ref 0.44–1.00)
Creatinine, Ser: 1.18 mg/dL — ABNORMAL HIGH (ref 0.44–1.00)
GFR, Estimated: 57 mL/min — ABNORMAL LOW (ref >60)
GFR, Estimated: >60 mL/min (ref >60)
GFR, Estimated: 59 mL/min — ABNORMAL LOW (ref >60)
GFR, Estimated: >60 mL/min (ref >60)
Glucose, Bld: 182 mg/dL — ABNORMAL HIGH (ref 70–99)
Glucose, Bld: 198 mg/dL — ABNORMAL HIGH (ref 70–99)
Glucose, Bld: 201 mg/dL — ABNORMAL HIGH (ref 70–99)
Glucose, Bld: 195 mg/dL — ABNORMAL HIGH (ref 70–99)
Potassium: 5.8 mmol/L — ABNORMAL HIGH (ref 3.5–5.1)
Potassium: 5.7 mmol/L — ABNORMAL HIGH (ref 3.5–5.1)
Potassium: 6.3 mmol/L (ref 3.5–5.1)
Potassium: 4.8 mmol/L (ref 3.5–5.1)
Sodium: 133 mmol/L — ABNORMAL LOW (ref 135–145)
Sodium: 131 mmol/L — ABNORMAL LOW (ref 135–145)
Sodium: 132 mmol/L — ABNORMAL LOW (ref 135–145)
Sodium: 132 mmol/L — ABNORMAL LOW (ref 135–145)

## 2020-12-02 LAB — COOXEMETRY PANEL
Carboxyhemoglobin: 1.8 % — ABNORMAL HIGH (ref 0.5–1.5)
Methemoglobin: 1.1 % (ref 0.0–1.5)
O2 Saturation: 68.7 %
Total hemoglobin: 11.5 g/dL — ABNORMAL LOW (ref 12.0–16.0)

## 2020-12-02 LAB — TRIGLYCERIDES: Triglycerides: 908 mg/dL — ABNORMAL HIGH (ref <150)

## 2020-12-02 LAB — LIPID PANEL
Cholesterol: 494 mg/dL — ABNORMAL HIGH (ref 0–200)
Cholesterol: 562 mg/dL — ABNORMAL HIGH (ref 0–200)
HDL: 24 mg/dL — ABNORMAL LOW (ref >40)
HDL: 28 mg/dL — ABNORMAL LOW (ref >40)
LDL Cholesterol: UNDETERMINED mg/dL (ref 0–99)
LDL Cholesterol: UNDETERMINED mg/dL (ref 0–99)
Total CHOL/HDL Ratio: 20.6 RATIO
Total CHOL/HDL Ratio: 20.1 RATIO
Triglycerides: 944 mg/dL — ABNORMAL HIGH (ref <150)
Triglycerides: 873 mg/dL — ABNORMAL HIGH (ref <150)
VLDL: UNDETERMINED mg/dL (ref 0–40)
VLDL: UNDETERMINED mg/dL (ref 0–40)

## 2020-12-02 LAB — ANCA PROFILE
Anti-MPO Antibodies: <0.2 units (ref 0.0–0.9)
Anti-PR3 Antibodies: <0.2 units (ref 0.0–0.9)
Atypical P-ANCA titer: <1:20 {titer}
C-ANCA: <1:20 {titer}
P-ANCA: <1:20 {titer}

## 2020-12-02 LAB — CBC
HCT: 35.6 % — ABNORMAL LOW (ref 36.0–46.0)
Hemoglobin: 11.3 g/dL — ABNORMAL LOW (ref 12.0–15.0)
MCH: 27.8 pg (ref 26.0–34.0)
MCHC: 31.7 g/dL (ref 30.0–36.0)
MCV: 87.7 fL (ref 80.0–100.0)
Platelets: 136 10*3/uL — ABNORMAL LOW (ref 150–400)
RBC: 4.06 MIL/uL (ref 3.87–5.11)
RDW: 16.1 % — ABNORMAL HIGH (ref 11.5–15.5)
WBC: 7.9 10*3/uL (ref 4.0–10.5)
nRBC: 0 % (ref 0.0–0.2)

## 2020-12-02 LAB — LDL CHOLESTEROL, DIRECT: Direct LDL: 233.3 mg/dL — ABNORMAL HIGH (ref 0–99)

## 2020-12-02 LAB — PHOSPHORUS
Phosphorus: 4.6 mg/dL (ref 2.5–4.6)
Phosphorus: 6 mg/dL — ABNORMAL HIGH (ref 2.5–4.6)

## 2020-12-02 LAB — HEPATIC FUNCTION PANEL
ALT: 611 U/L — ABNORMAL HIGH (ref 0–44)
AST: 123 U/L — ABNORMAL HIGH (ref 15–41)
Albumin: 3 g/dL — ABNORMAL LOW (ref 3.5–5.0)
Alkaline Phosphatase: 135 U/L — ABNORMAL HIGH (ref 38–126)
Bilirubin, Direct: 0.4 mg/dL — ABNORMAL HIGH (ref 0.0–0.2)
Indirect Bilirubin: 0.8 mg/dL (ref 0.3–0.9)
Total Bilirubin: 1.2 mg/dL (ref 0.3–1.2)
Total Protein: 6.4 g/dL — ABNORMAL LOW (ref 6.5–8.1)

## 2020-12-02 LAB — MAGNESIUM: Magnesium: 2.4 mg/dL (ref 1.7–2.4)

## 2020-12-02 MED ORDER — SODIUM CHLORIDE 0.9 % IV SOLN: INTRAVENOUS | Status: DC

## 2020-12-02 MED ORDER — SODIUM ZIRCONIUM CYCLOSILICATE 10 G PO PACK
10.0000 g | PACK | Freq: Once | ORAL | Status: DC
  Filled 2020-12-02: qty 1

## 2020-12-02 MED ORDER — DEXTROSE 50 % IV SOLN
1.0000 | Freq: Once | INTRAVENOUS | Status: AC
  Administered 2020-12-02: 50 mL via INTRAVENOUS
  Filled 2020-12-02: qty 50

## 2020-12-02 MED ORDER — SACUBITRIL-VALSARTAN 97-103 MG PO TABS
1.0000 | ORAL_TABLET | Freq: Two times a day (BID) | ORAL | Status: DC
  Filled 2020-12-02: qty 1

## 2020-12-02 MED ORDER — SODIUM ZIRCONIUM CYCLOSILICATE 10 G PO PACK
10.0000 g | PACK | Freq: Once | ORAL | Status: AC
  Administered 2020-12-02: 10 g via ORAL
  Filled 2020-12-02: qty 1

## 2020-12-02 MED ORDER — INSULIN ASPART 100 UNIT/ML IV SOLN
10.0000 [IU] | Freq: Once | INTRAVENOUS | Status: AC
  Administered 2020-12-02: 10 [IU] via INTRAVENOUS

## 2020-12-02 MED ORDER — IVABRADINE HCL 5 MG PO TABS
2.5000 mg | ORAL_TABLET | Freq: Two times a day (BID) | ORAL | Status: DC
  Administered 2020-12-02: 2.5 mg via ORAL
  Filled 2020-12-02 (×2): qty 1

## 2020-12-02 MED ORDER — SODIUM ZIRCONIUM CYCLOSILICATE 10 G PO PACK: 10.0000 g | PACK | Freq: Two times a day (BID) | ORAL | Status: DC

## 2020-12-02 MED ORDER — INSULIN ASPART 100 UNIT/ML IV SOLN: 5.0000 [IU] | Freq: Once | INTRAVENOUS | Status: DC

## 2020-12-02 MED ORDER — SODIUM CHLORIDE 0.9% FLUSH
3.0000 mL | Freq: Two times a day (BID) | INTRAVENOUS | Status: DC
Start: 2020-12-02 — End: 2020-12-05
  Administered 2020-12-04: 3 mL via INTRAVENOUS

## 2020-12-02 MED ORDER — SODIUM CHLORIDE 0.9% FLUSH: 3.0000 mL | INTRAVENOUS | Status: DC | PRN

## 2020-12-02 MED ORDER — HYDRALAZINE HCL 50 MG PO TABS
50.0000 mg | ORAL_TABLET | Freq: Three times a day (TID) | ORAL | Status: DC
  Administered 2020-12-02 – 2020-12-04 (×5): 50 mg via ORAL
  Filled 2020-12-02 (×5): qty 1

## 2020-12-02 MED ORDER — INSULIN DETEMIR 100 UNIT/ML ~~LOC~~ SOLN
33.0000 [IU] | Freq: Two times a day (BID) | SUBCUTANEOUS | Status: DC
  Administered 2020-12-02 – 2020-12-05 (×7): 33 [IU] via SUBCUTANEOUS
  Filled 2020-12-02 (×9): qty 0.33

## 2020-12-02 MED ORDER — DEXTROSE 50 % IV SOLN: 1.0000 | Freq: Once | INTRAVENOUS | Status: DC

## 2020-12-02 MED ORDER — HYDRALAZINE HCL 20 MG/ML IJ SOLN: 10.0000 mg | Freq: Four times a day (QID) | INTRAMUSCULAR | Status: DC | PRN

## 2020-12-02 MED ORDER — SODIUM ZIRCONIUM CYCLOSILICATE 5 G PO PACK
5.0000 g | PACK | Freq: Once | ORAL | Status: AC
  Administered 2020-12-02: 5 g via ORAL
  Filled 2020-12-02: qty 1

## 2020-12-02 MED ORDER — INSULIN ASPART 100 UNIT/ML IV SOLN: 10.0000 [IU] | Freq: Once | INTRAVENOUS | Status: DC

## 2020-12-02 MED ORDER — OXYCODONE-ACETAMINOPHEN 5-325 MG PO TABS
1.0000 | ORAL_TABLET | ORAL | Status: DC | PRN
  Administered 2020-12-02 – 2020-12-04 (×8): 1 via ORAL
  Filled 2020-12-02 (×8): qty 1

## 2020-12-02 MED ORDER — SODIUM CHLORIDE 0.9 % IV SOLN: 250.0000 mL | INTRAVENOUS | Status: DC | PRN

## 2020-12-02 NOTE — Progress Notes (Addendum)
Advanced Heart Failure Rounding Note  PCP-Cardiologist: Dr. Terri Skains AHF: Dr. Haroldine Laws    Subjective:    Remains on milrinone, rate reduced to 0.125 yesterday. Co-ox stable at 69%    1.3L in UOP yesterday. Wt stable overnight at 119 lb. Overall down 11 lb this admit. CVP 6-7.   SCr improved 2.55>>2.12>>1.36>>1.30. LFTs continue to trend down.   Entresto restarted yesterday. BP remains elevated, 338S systolic. K elevated at 5.8. Also got runs of IV KCl overnight.   Remains in sinus tach, 120s-130s.   cMRI yesterday w/ findings c/w prior myocarditis vs sarcoid (see below). Mod pericardial effusion also noted. LVEF 29%. RV normal.   RF elevated at 15.5. Other serologies pending.   OOB sitting up in chair. Overall feels better. Denies dyspnea. No orthopnea/PND. Hasn't ambulated much. Also not eating much. Didn't eat breakfast.    cMRI 12/01/20  IMPRESSION: 1. Midwall late gadolinium enhancement in basal to mid anterior/anterolateral walls. Differential diagnosis includes prior myocarditis (normal T2 values suggests no evidence of acute myocarditis) versus sarcoidosis. Would recommend cardiac PET to evaluate for sarcoid.   2.  Mild LV dilatation with severe systolic dysfunction (EF 50%)   3.  Normal RV size and systolic function (EF 53%)   4. Moderate pericardial effusion measuring up to 1cm adjacent to RV free wall  Objective:   Weight Range: 54.4 kg Body mass index is 25.95 kg/m.   Vital Signs:   Temp:  [97.6 F (36.4 C)-98.4 F (36.9 C)] 97.6 F (36.4 C) (09/07 0743) Pulse Rate:  [113-135] 130 (09/07 0810) Resp:  [18-26] 18 (09/07 0810) BP: (131-161)/(85-116) 158/107 (09/07 0810) SpO2:  [93 %-100 %] 97 % (09/07 0810) Weight:  [54.4 kg] 54.4 kg (09/07 0500) Last BM Date: 11/30/20  Weight change: Filed Weights   11/30/20 0500 12/01/20 0500 12/02/20 0500  Weight: 57.3 kg 54.4 kg 54.4 kg    Intake/Output:   Intake/Output Summary (Last 24 hours) at  12/02/2020 0846 Last data filed at 12/02/2020 0759 Gross per 24 hour  Intake 795.85 ml  Output 1350 ml  Net -554.15 ml      Physical Exam   CVP 6  General:  chronically ill appearing young WF. No respiratory difficulty HEENT: normal + Rt IJ CVC  Neck: supple. no JVD. Carotids 2+ bilat; no bruits. No lymphadenopathy or thyromegaly appreciated. Cor: PMI nondisplaced. Regular rhythm, tachy rate. No rubs, gallops or murmurs. Lungs: clear Abdomen: soft, nontender, nondistended. No hepatosplenomegaly. No bruits or masses. Good bowel sounds. Extremities: no cyanosis, clubbing, rash, edema Neuro: alert & oriented x 3, cranial nerves grossly intact. moves all 4 extremities w/o difficulty. Affect pleasant.    Telemetry   Sinus tach 120s-130s Personally reviewed   EKG    No new EKG to review   Labs    CBC Recent Labs    12/01/20 0303 12/02/20 0421  WBC 7.8 7.9  HGB 11.4* 11.3*  HCT 34.4* 35.6*  MCV 86.4 87.7  PLT 134* 976*   Basic Metabolic Panel Recent Labs    12/01/20 0303 12/02/20 0421  NA 135 133*  K 3.6 5.8*  CL 99 100  CO2 27 26  GLUCOSE 75 182*  BUN 33* 30*  CREATININE 1.36* 1.30*  CALCIUM 8.5* 9.3  MG 1.8 2.4  PHOS 4.0 4.6   Liver Function Tests Recent Labs    12/01/20 0303 12/02/20 0421  AST 318* 123*  ALT 1,174* 611*  ALKPHOS 180* 135*  BILITOT 1.4* 1.2  PROT 6.8  6.4*  ALBUMIN 3.4* 3.0*   No results for input(s): LIPASE, AMYLASE in the last 72 hours.  Cardiac Enzymes No results for input(s): CKTOTAL, CKMB, CKMBINDEX, TROPONINI in the last 72 hours.  BNP: BNP (last 3 results) Recent Labs    11/23/20 0224 11/28/20 1511  BNP 48.9 529.6*    ProBNP (last 3 results) No results for input(s): PROBNP in the last 8760 hours.   D-Dimer No results for input(s): DDIMER in the last 72 hours. Hemoglobin A1C No results for input(s): HGBA1C in the last 72 hours. Fasting Lipid Panel Recent Labs    12/01/20 2110 12/02/20 0421  CHOL 514*  --    HDL 25*  --   LDLCALC UNABLE TO CALCULATE IF TRIGLYCERIDE OVER 400 mg/dL  --   TRIG 971* 908*  CHOLHDL 20.6  --   LDLDIRECT 227.1*  --    Thyroid Function Tests No results for input(s): TSH, T4TOTAL, T3FREE, THYROIDAB in the last 72 hours.  Invalid input(s): FREET3   Other results:   Imaging    MR CARDIAC MORPHOLOGY W WO CONTRAST  Result Date: 12/01/2020 CLINICAL DATA:  63F with chronic systolic heart failure p/w cardiogenic shock. Echo with EF 20-25% with moderate RV dysfunction Cardiac MRI  08/04/2017 ([facility]): 1. The left ventricle is normal in size and has moderately decreased function EF 42% There is mid to basal inferoseptal hypokinesis. The right ventricle is normal in size and has mildly decreased function. 2. There is evidence of subepicardial enhancement of the mid inferoseptal wall segment, which is compatible with myocarditis. There is also a very small focal subendocardial enhancement in the mid inferoseptum which is compatible with infarction. 3. There is evidence of myocardial edema of the mid epicardial inferoseptum which is compatible with acute myocarditis. 4. Small circumferential pericardial effusion EXAM: CARDIAC MRI TECHNIQUE: The patient was scanned on a 1.5 Tesla Siemens magnet. A dedicated cardiac coil was used. Functional imaging was done using Fiesta sequences. 2,3, and 4 chamber views were done to assess for RWMA's. Modified Simpson's rule using a short axis stack was used to calculate an ejection fraction on a dedicated work Conservation officer, nature. The patient received 6 cc of Gadavist. After 10 minutes inversion recovery sequences were used to assess for infiltration and scar tissue. CONTRAST:  6 cc  of Gadavist FINDINGS: Left ventricle: -Mild dilatation -Severe systolic dysfunction -Mild nonspecific ECV elevation (28%) -Normal global and regional T2 values -Midwall LGE in basal to mid anterior/anterolateral walls LV EF:  29% (Normal 56-78%) Absolute volumes: LV  EDV: 148m (Normal 52-141 mL) LV ESV: 1050m(Normal 13-51 mL) LV SV: 4282mNormal 33-97 mL) CO: 5.1L/min (Normal 2.7-6.0 L/min) Indexed volumes: LV EDV: 48m61m-m (Normal 41-81 mL/sq-m) LV ESV: 70mL70mm (Normal 12-21 mL/sq-m) LV SV: 28mL/31m (Normal 26-56 mL/sq-m) CI: 3.4L/min/sq-m (Normal 1.8-3.8 L/min/sq-m) Right ventricle: Normal size and systolic function RV EF: 55% (N58%al 47-80%) Absolute volumes: RV EDV: 89mL (26mal 58-154 mL) RV ESV: 40mL (N35ml 12-68 mL) RV SV: 49mL (No43m 35-98 mL) CO: 5.9L/min (Normal 2.7-6 L/min) Indexed volumes: RV EDV: 60mL/sq-m2mrmal 48-87 mL/sq-m) RV ESV: 27mL/sq-m 4mmal 11-28 mL/sq-m) RV SV: 33mL/sq-m (71mal 27-57 mL/sq-m) CI: 4.0L/min/sq-m (Normal 1.8-3.8 L/min/sq-m) Left atrium: Normal size Right atrium: Normal size Mitral valve: Mild regurgitation Aortic valve: Mild regurgitation Tricuspid valve: Moderate regurgitation Pulmonic valve: No regurgitation Aorta: Normal proximal ascending aorta Pericardium: Moderate effusion measuring up to 1cm adjacent to RV free wall IMPRESSION: 1. Midwall late gadolinium enhancement in basal to mid anterior/anterolateral walls. Differential diagnosis  includes prior myocarditis (normal T2 values suggests no evidence of acute myocarditis) versus sarcoidosis. Would recommend cardiac PET to evaluate for sarcoid. 2.  Mild LV dilatation with severe systolic dysfunction (EF 06%) 3.  Normal RV size and systolic function (EF 23%) 4. Moderate pericardial effusion measuring up to 1cm adjacent to RV free wall Electronically Signed   By: Oswaldo Milian M.D.   On: 12/01/2020 23:21     Medications:     Scheduled Medications:  Chlorhexidine Gluconate Cloth  6 each Topical Daily   cholecalciferol  1,000 Units Oral Daily   digoxin  0.125 mg Oral Daily   DULoxetine  60 mg Oral Daily   famotidine  10 mg Oral Daily   fenofibrate  160 mg Oral Daily   heparin injection (subcutaneous)  5,000 Units Subcutaneous Q8H   insulin aspart  0-15  Units Subcutaneous TID WC   insulin aspart  10 Units Subcutaneous TID WC   insulin detemir  33 Units Subcutaneous BID   levothyroxine  125 mcg Oral Q0600   lipase/protease/amylase  24,000 Units Oral TID WC   multivitamin with minerals  1 tablet Oral Daily   norethindrone-ethinyl estradiol  1 tablet Oral Daily   rosuvastatin  40 mg Oral Daily   sacubitril-valsartan  1 tablet Oral BID   sodium bicarbonate  650 mg Oral TID   sodium chloride flush  10-40 mL Intracatheter Q12H   sodium chloride flush  3 mL Intravenous Q12H    Infusions:  sodium chloride     sodium chloride Stopped (12/01/20 1212)   milrinone 0.125 mcg/kg/min (12/02/20 0700)    PRN Medications: sodium chloride, diphenhydrAMINE, HYDROmorphone (DILAUDID) injection, lipase/protease/amylase, ondansetron (ZOFRAN) IV, oxyCODONE-acetaminophen, phenol, prochlorperazine, senna-docusate, sodium chloride flush, sodium chloride flush  Assessment/Plan   1. Acute on chronic systolic HF -> cardiogenic shock - likely NICM - Echo 1/22: EF 35-40% Mod MR - Echo 11/28/20 20-25% with moderate RV dysfunction and mild septal flattening. Mod-severe TR - HS troponin 29 argues against acute myocarditis - Rheum serologies pending  - cMRI c/w prior myocarditis vs sarcoid. Will need f/u [facility]  - on Milrinone 0.125. Co-ox 69%. Stop milrinone today. Follow co-ox - CVP 6. Stop IV Lasix . Transition to PO tomorrow  - Increase Entresto to 97-103 bid  - No beta blocker yet due to shock   - Add Corlanor 2.5 bid for rate control  - no SGLT2i w/ T1DM  - F/u BMP this afternoon. If hyperkalemia improved post lokelma, will add low dose spiro 12.5 mg  - Plan RHC later this week    2. AKI  - Due to ATN/shock. Improving w/ inotropic support  - Scr 1.57 -> 3.12 -> 3.91 -> 3.01 -> 3.11->2.6->2.1->1.36->1.30   - follow BMP     3. Shock liver - continue hemodynamic support - CT shows severe hepatic steatosis - LFTs improving - Statin restarted  per CCM    4. 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 Hyponatremia - 131 today  - monitor    6. Type I DM - per CCM - on insulin gtt   7. Fall - fell out of bed on 11/29/20 - L-S spine films ok   8. Hyperkalemia - K 5.8 (suspect due to over-replacement)  - Give Lokelma 10 g x 1 - F/u BMP this afternoon    Length of  Stay: [address], PA-C  12/02/2020, 8:46 AM  Advanced Heart Failure Team Pager [phone] (M-F; 7a - 5p)  Please contact [facility] for night-coverage after hours (5p -7a ) and weekends on [email or web address]  Patient seen and examined with the above-signed Advanced Practice Provider and/or Housestaff. I personally reviewed laboratory data, imaging studies and relevant notes. I independently examined the patient and formulated the important aspects of the plan. I have edited the note to reflect any of my changes or salient points. I have personally discussed the plan with the patient and/or family.  Milrinone weaned off this am. Co-ox 69% Feels ok. Still weak but denies SOB. SBP high.   cMRI LVEF 39% RV 55%   General:  Weak appearing. No resp difficulty HEENT: normal + R ptosis  Neck: supple. no JVD. Carotids 2+ bilat; no bruits. No lymphadenopathy or thryomegaly appreciated. Cor: PMI nondisplaced. Tachy regular Lungs: clear Abdomen: soft, nontender, nondistended. No hepatosplenomegaly. No bruits or masses. Good bowel sounds. Extremities: no cyanosis, clubbing, rash, edema weak on left  Neuro: alert & orientedx3, cranial nerves grossly intact. moves all 4 extremities w/o difficulty. Affect pleasant  cMRI with LVEF 29%. I doubt sarcoid. Perhaps delayed/progressive cardiotoxicity.   Agree with stopping milrinone. Titrate GDMT (as permitted by hypokalemia). Plan R/L cath tomorrow with Dr. Einar Gip. Consider corlanor if output ok on cath.    Suspect she will likely need advanced therapies in the near future. Discussed potential VAD with [facility] transplant team.   Glori Bickers, MD  2:37 PM

## 2020-12-02 NOTE — Progress Notes (Signed)
Physical Therapy Treatment Patient Details Name: Jennifer [name] MRN: [id number] DOB: 1990/06/30 Today's Date: 12/02/2020    History of Present Illness Pt is a 30 y.o. female admitted 11/22/20 with abrominal pain, diarrhea; pt found to have hypotension, elevated creatinine; workup for cardiogenic shock, AKI. ECHO showed LVEF 25-30%, moderate to severe TR. cMRI consistent with prior myocarditis vs sarcoid; moderate pericardial effusion. Of note, pt with fall out of bed on 9/4. Plan for RHC this week. 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   PT Comments    Pt progressing with mobility. Today's session focused on transfer and gait training with and without SPC; pt requires intermittent minA to prevent LOB with mobility. Pt not interested in use of SPC or walker at home. Educ re: fall risk reduction, activity recommendations, Continue to recommend follow-up with OP PT to address higher level balance impairments.    Follow Up Recommendations  Outpatient PT;Supervision for mobility/OOB     Equipment Recommendations  None recommended by PT    Recommendations for Other Services       Precautions / Restrictions Precautions Precautions: Fall;Other (comment) Precaution Comments: Watch BP; h/o residual L-side deficits Restrictions Weight Bearing Restrictions: No    Mobility  Bed Mobility Overal bed mobility: Modified Independent Bed Mobility: Sit to Supine;Rolling                Transfers Overall transfer level: Needs assistance Equipment used: None Transfers: Sit to/from Stand Sit to Stand: Min guard            Ambulation/Gait Ambulation/Gait assistance: Min guard;Min Web designer (Feet): 390 Feet Assistive device: Straight cane;IV Pole;None Gait Pattern/deviations: Step-through pattern;Decreased stride length;Staggering right;Staggering left Gait velocity: Decreased   General Gait  Details: Slow, unsteady gait with intermittent minA to prevent LOB; initial gait trial with SPC, which pt did not like using; transitioned to single UE support on IV pole, then no UE support; pt requiring 1x seated rest break secondary to c/o fatigue and dizziness   Stairs Stairs:  (educ pt and husband on safe technique for stairs, including use of rail, step-to pattern and husband guarding for balance)           Wheelchair Mobility    Modified Rankin (Stroke Patients Only)       Balance Overall balance assessment: Needs assistance   Sitting balance-Leahy Scale: Good       Standing balance-Leahy Scale: Fair Standing balance comment: can static stand without UE support; static and dynamic stability improved with UE support                            Cognition Arousal/Alertness: Awake/alert Behavior During Therapy: WFL for tasks assessed/performed;Flat affect Overall Cognitive Status: Within Functional Limits for tasks assessed                                        Exercises      General Comments General comments (skin integrity, edema, etc.): Post-ambulation BP 130/96, HR up to 130s with mobility. Increased time discussing fall risk reduction and DME recommendations - pt ultimately not interested in use of DME to improve balance      Pertinent Vitals/Pain Pain Assessment: Faces Faces Pain Scale: Hurts a little bit Pain Location: sacral area Pain Descriptors / Indicators: Sore Pain Intervention(s): Monitored during  session;Repositioned    Home Living                      Prior Function            PT Goals (current goals can now be found in the care plan section) Progress towards PT goals: Progressing toward goals    Frequency    Min 3X/week      PT Plan Current plan remains appropriate    Co-evaluation              AM-PAC PT "6 Clicks" Mobility   Outcome Measure  Help needed turning from your back to  your side while in a flat bed without using bedrails?: None Help needed moving from lying on your back to sitting on the side of a flat bed without using bedrails?: None Help needed moving to and from a bed to a chair (including a wheelchair)?: A Little Help needed standing up from a chair using your arms (e.g., wheelchair or bedside chair)?: A Little Help needed to walk in hospital room?: A Little Help needed climbing 3-5 steps with a railing? : A Little 6 Click Score: 20    End of Session Equipment Utilized During Treatment: Gait belt Activity Tolerance: Patient tolerated treatment well Patient left: in bed;with call bell/phone within reach;with bed alarm set Nurse Communication: Mobility status PT Visit Diagnosis: Difficulty in walking, not elsewhere classified (R26.2);Unsteadiness on feet (R26.81)     Time: 7897-8478 PT Time Calculation (min) (ACUTE ONLY): 27 min  Charges:  $Gait Training: 8-22 mins $Therapeutic Activity: 8-22 mins                    Mabeline Caras, PT, DPT Acute Rehabilitation Services  Pager [phone] Office [place] 12/02/2020, 5:01 PM

## 2020-12-02 NOTE — Progress Notes (Signed)
Progress Note  Patient Name: Jennifer [name] Date of Encounter: 12/02/2020  Attending physician: Candee Furbish, MD Primary care provider: Sue Lush, PA-C Primary Cardiologist: Dr. Einar Gip Consultant:Arcangel Minion Terri Skains, DO  Subjective: Jennifer [name] is a 30 y.o. female who was seen and examined at bedside  Denies chest pain. Shortness of breath at baseline.  LFTs and creatinine continue to improve. No family at bedside.  Case discussed and reviewed with her nurse.  Objective: Vital Signs in the last 24 hours: Temp:  [97.6 F (36.4 C)-98.4 F (36.9 C)] 98.1 F (36.7 C) (09/07 1124) Pulse Rate:  [113-130] 130 (09/07 0810) Resp:  [18-26] 18 (09/07 0810) BP: (131-161)/(85-116) 141/91 (09/07 1125) SpO2:  [93 %-100 %] 97 % (09/07 0810) Weight:  [54.4 kg] 54.4 kg (09/07 0500)  Intake/Output:  Intake/Output Summary (Last 24 hours) at 12/02/2020 1425 Last data filed at 12/02/2020 1100 Gross per 24 hour  Intake 408.62 ml  Output 1300 ml  Net -891.38 ml    Net IO Since Admission: -1,428.27 mL [12/02/20 1425]  Weights:  Filed Weights   11/30/20 0500 12/01/20 0500 12/02/20 0500  Weight: 57.3 kg 54.4 kg 54.4 kg    Telemetry: Personally reviewed - sinus tachycardia   Physical examination: PHYSICAL EXAM: Vitals with BMI 12/02/2020 12/02/2020 12/02/2020  Height - - -  Weight - - -  BMI - - -  Systolic 161 096 -  Diastolic 91 045 -  Pulse - 130 122   CONSTITUTIONAL: Appears older than stated age, well-developed and well-nourished. No acute distress.  Hemodynamically stable. SKIN: Skin is warm and dry. No rash noted. No cyanosis. No pallor. No jaundice HEAD: Normocephalic and atraumatic.  EYES: No scleral icterus MOUTH/THROAT: Moist oral membranes.  NECK: No JVP. No thyromegaly noted. No carotid bruits  LYMPHATIC: No visible cervical adenopathy.  CHEST Normal respiratory effort. No intercostal retractions  LUNGS: CTAB, no rales or stridor. No wheezes.  CARDIOVASCULAR:  Tachycardia, regular, positive W0-J8, soft systolic murmur heard over the left lower sternal border, no gallops or rubs. ABDOMINAL: Soft, nondistended, nontender, decreased bowel sounds in all 4 quadrants, EXTREMITIES: Right lower extremity more swollen compared to left (chronic per patient), +trace edema bilaterally, Warm to touch.  HEMATOLOGIC: No significant bruising NEUROLOGIC: Oriented to person, place, and time.  Right proptosis.  Left-sided weakness  PSYCHIATRIC: Normal mood and affect. Normal behavior. Cooperative  Lab Results: Hematology Recent Labs  Lab 11/30/20 0211 12/01/20 0303 12/02/20 0421  WBC 8.4 7.8 7.9  RBC 3.47* 3.98 4.06  HGB 10.2* 11.4* 11.3*  HCT 30.4* 34.4* 35.6*  MCV 87.6 86.4 87.7  MCH 29.4 28.6 27.8  MCHC 33.6 33.1 31.7  RDW 16.6* 16.4* 16.1*  PLT 123* 134* 136*    Chemistry Recent Labs  Lab 11/30/20 0500 12/01/20 0303 12/02/20 0421 12/02/20 0811  NA  --  135 133* 131*  K  --  3.6 5.8* 5.7*  CL  --  99 100 97*  CO2  --  27 26 25   GLUCOSE  --  75 182* 198*  BUN  --  33* 30* 28*  CREATININE  --  1.36* 1.30* 1.19*  CALCIUM  --  8.5* 9.3 9.3  PROT RESULTS UNAVAILABLE DUE TO INTERFERING SUBSTANCE 6.8 6.4*  --   ALBUMIN 3.2* 3.4* 3.0*  --   AST 501* 318* 123*  --   ALT 1,251* 1,174* 611*  --   ALKPHOS 139* 180* 135*  --   BILITOT 1.6* 1.4* 1.2  --   GFRNONAA  --  54* 57* >60  ANIONGAP  --  9 7 9      Cardiac Enzymes: Cardiac Panel (last 3 results) No results for input(s): CKTOTAL, CKMB, TROPONINIHS, RELINDX in the last 72 hours.   BNP (last 3 results) Recent Labs    11/23/20 0224 11/28/20 1511  BNP 48.9 529.6*    ProBNP (last 3 results) No results for input(s): PROBNP in the last 8760 hours.   DDimer No results for input(s): DDIMER in the last 168 hours.   Hemoglobin A1c:  Lab Results  Component Value Date   HGBA1C 7.8 (H) 11/23/2020   MPG 177.16 11/23/2020    TSH  Recent Labs    11/28/20 1511  TSH 0.098*    Lipid  Panel     Component Value Date/Time   CHOL 494 (H) 12/02/2020 0811   TRIG 944 (H) 12/02/2020 0811   HDL 24 (L) 12/02/2020 0811   CHOLHDL 20.6 12/02/2020 0811   VLDL UNABLE TO CALCULATE IF TRIGLYCERIDE OVER 400 mg/dL 12/02/2020 0811   LDLCALC UNABLE TO CALCULATE IF TRIGLYCERIDE OVER 400 mg/dL 12/02/2020 0811   LDLDIRECT 227.1 (H) 12/01/2020 2110    Imaging: MR CARDIAC MORPHOLOGY W WO CONTRAST  Result Date: 12/01/2020 CLINICAL DATA:  63F with chronic systolic heart failure p/w cardiogenic shock. Echo with EF 20-25% with moderate RV dysfunction Cardiac MRI  08/04/2017 ([facility]): 1. The left ventricle is normal in size and has moderately decreased function EF 42% There is mid to basal inferoseptal hypokinesis. The right ventricle is normal in size and has mildly decreased function. 2. There is evidence of subepicardial enhancement of the mid inferoseptal wall segment, which is compatible with myocarditis. There is also a very small focal subendocardial enhancement in the mid inferoseptum which is compatible with infarction. 3. There is evidence of myocardial edema of the mid epicardial inferoseptum which is compatible with acute myocarditis. 4. Small circumferential pericardial effusion EXAM: CARDIAC MRI TECHNIQUE: The patient was scanned on a 1.5 Tesla Siemens magnet. A dedicated cardiac coil was used. Functional imaging was done using Fiesta sequences. 2,3, and 4 chamber views were done to assess for RWMA's. Modified Simpson's rule using a short axis stack was used to calculate an ejection fraction on a dedicated work Conservation officer, nature. The patient received 6 cc of Gadavist. After 10 minutes inversion recovery sequences were used to assess for infiltration and scar tissue. CONTRAST:  6 cc  of Gadavist FINDINGS: Left ventricle: -Mild dilatation -Severe systolic dysfunction -Mild nonspecific ECV elevation (28%) -Normal global and regional T2 values -Midwall LGE in basal to mid  anterior/anterolateral walls LV EF:  29% (Normal 56-78%) Absolute volumes: LV EDV: 128m (Normal 52-141 mL) LV ESV: 1040m(Normal 13-51 mL) LV SV: 4220mNormal 33-97 mL) CO: 5.1L/min (Normal 2.7-6.0 L/min) Indexed volumes: LV EDV: 68m69m-m (Normal 41-81 mL/sq-m) LV ESV: 70mL53mm (Normal 12-21 mL/sq-m) LV SV: 28mL/83m (Normal 26-56 mL/sq-m) CI: 3.4L/min/sq-m (Normal 1.8-3.8 L/min/sq-m) Right ventricle: Normal size and systolic function RV EF: 55% (N42%al 47-80%) Absolute volumes: RV EDV: 89mL (39mal 58-154 mL) RV ESV: 40mL (N59ml 12-68 mL) RV SV: 49mL (No26m 35-98 mL) CO: 5.9L/min (Normal 2.7-6 L/min) Indexed volumes: RV EDV: 60mL/sq-m48mrmal 48-87 mL/sq-m) RV ESV: 27mL/sq-m 80mmal 11-28 mL/sq-m) RV SV: 33mL/sq-m (38mal 27-57 mL/sq-m) CI: 4.0L/min/sq-m (Normal 1.8-3.8 L/min/sq-m) Left atrium: Normal size Right atrium: Normal size Mitral valve: Mild regurgitation Aortic valve: Mild regurgitation Tricuspid valve: Moderate regurgitation Pulmonic valve: No regurgitation Aorta: Normal proximal ascending aorta Pericardium: Moderate effusion measuring up to 1cm adjacent to  RV free wall IMPRESSION: 1. Midwall late gadolinium enhancement in basal to mid anterior/anterolateral walls. Differential diagnosis includes prior myocarditis (normal T2 values suggests no evidence of acute myocarditis) versus sarcoidosis. Would recommend cardiac PET to evaluate for sarcoid. 2.  Mild LV dilatation with severe systolic dysfunction (EF 06%) 3.  Normal RV size and systolic function (EF 30%) 4. Moderate pericardial effusion measuring up to 1cm adjacent to RV free wall Electronically Signed   By: Oswaldo Milian M.D.   On: 12/01/2020 23:21    Cardiac database: EKG: 11/14/2020: Sinus tachycardia, 107 bpm, poor R wave progression, without underlying injury pattern.Compared to prior ECG 10/07/2020 ventricular rate has improved.   Echocardiogram: 11/28/2020:  1. Left ventricular ejection fraction, by estimation, is 25 to 30%.  The left ventricle has severely decreased function. The left ventricle demonstrates global hypokinesis. Left ventricular diastolic parameters are  consistent with Grade II diastolic dysfunction (pseudonormalization). Elevated left atrial pressure. There is the interventricular septum is flattened in diastole ('D' shaped left ventricle), consistent with right ventricular volume overload.   2. Right ventricular systolic function is mildly reduced. The right ventricular size is normal. There is mildly elevated pulmonary artery systolic pressure.   3. The mitral valve is normal in structure. Mild to moderate mitral valve regurgitation.   4. Tricuspid valve regurgitation is moderate to severe.   5. The aortic valve is normal in structure. Aortic valve regurgitation is  mild.    Stress Testing:  Lexiscan (Walking with mod Bruce) Sestamibi Stress Test 05/08/2019: Nondiagnostic ECG stress. Myocardial perfusion is normal. LV is normal in size both in rest and stress images. Stress LV EF:  calculated at 38% but visually normal. No previous exam available for comparison. Low risk study.   Scheduled Meds:  Chlorhexidine Gluconate Cloth  6 each Topical Daily   cholecalciferol  1,000 Units Oral Daily   digoxin  0.125 mg Oral Daily   DULoxetine  60 mg Oral Daily   famotidine  10 mg Oral Daily   fenofibrate  160 mg Oral Daily   heparin injection (subcutaneous)  5,000 Units Subcutaneous Q8H   insulin aspart  0-15 Units Subcutaneous TID WC   insulin aspart  10 Units Subcutaneous TID WC   insulin detemir  33 Units Subcutaneous BID   levothyroxine  125 mcg Oral Q0600   lipase/protease/amylase  24,000 Units Oral TID WC   multivitamin with minerals  1 tablet Oral Daily   norethindrone-ethinyl estradiol  1 tablet Oral Daily   rosuvastatin  40 mg Oral Daily   sacubitril-valsartan  1 tablet Oral BID   sodium bicarbonate  650 mg Oral TID   sodium chloride flush  10-40 mL Intracatheter Q12H   sodium chloride  flush  3 mL Intravenous Q12H    Continuous Infusions:  sodium chloride     sodium chloride Stopped (12/01/20 1212)    PRN Meds: sodium chloride, diphenhydrAMINE, HYDROmorphone (DILAUDID) injection, lipase/protease/amylase, ondansetron (ZOFRAN) IV, oxyCODONE-acetaminophen, phenol, prochlorperazine, senna-docusate, sodium chloride flush, sodium chloride flush   IMPRESSION & RECOMMENDATIONS: Jennifer [name] is a 30 y.o. 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  Acute on chronic systolic heart failure stage C, NYHA class II/III: Most likely secondary to nonischemic cardiomyopathy  Echocardiogram 03/2020: LVEF 35-40% with moderate MR. Echocardiogram 11/28/2020:  LVEF 25-30%, RV dysfunction, moderate to severe TR. Cardiac MRI results reviewed.  Off of inotropic support. Tolerating initiation of Entresto well. Will discuss with heart failure team with regards to initiation of beta-blockers in the next 24 to 48 hours. Given the new diagnosis of severely reduced LVEF, insulin-dependent diabetes mellitus type 1, and hypertriglyceridemia the procedure of left and right heart catheterization with possible intervention was explained to the patient in detail.  The indication, alternatives, risks and benefits were reviewed.  Complications include but not limited to bleeding, infection, vascular injury, stroke, myocardial infection, arrhythmia, kidney injury requiring short-term/long-term hemodialysis, radiation-related injury in the case of prolonged fluoroscopy use, emergency cardiac surgery, and death. The patient understands the risks of serious complication is 1-2 in 5621 with diagnostic cardiac cath and 1-2%  or less with angioplasty/stenting. The patient voices understanding and provides verbal feedback and wishes to proceed with coronary angiography with possible PCI.  Nonischemic cardiomyopathy: See above  Acute kidney injury: Improving.  Most likely secondary to ischemic ATN, polypharmacy, recent contrast exposure, cardiorenal physiology. Monitor BUN and Cr.   Shock liver secondary to acute/chronic heart failure with reduced EF: AST and ALT improving Monitor for now.  Hypertriglyceridemia with lipoprotein lipase deficiency: TG on arrival 4040 Trending up as of this morning.  Currently on insulin drip. Management per CCM.   Hypothyroidism: Currently on Synthroid. Will defer to CCM  Type 1 diabetes mellitus with hypertriglyceridemia and circulatory complication: Most recent hemoglobin A1c 7.8. Currently on insulin drip. Monitor serum glucose.  Patient's questions and concerns were addressed to her satisfaction. She voices understanding of the instructions provided during this encounter.   This note was created using a voice recognition software as a result there may be grammatical errors inadvertently enclosed that do not reflect the nature of this encounter. Every attempt is made to correct such errors.  Total time spent : 25 minutes.   Rex Kras, [place], [facility]  Pager: [phone] Office: [phone]

## 2020-12-02 NOTE — Progress Notes (Signed)
NAME:  Jennifer [name], MRN:  [id number], DOB:  April 10, 1990, LOS: 9 ADMISSION DATE:  11/22/2020, CONSULTATION DATE:  9/3 REFERRING MD:  Sabino Gasser MD, Reason for consult:  decompensated heart failure   History of Present Illness:  Jennifer [name] is a  30 y.o. F who is seen in consultation at the request of Dr. Sabino Gasser for recommendations on further evaluation and management of decompensated heart failure. The patient presented to [facility] ED  on 8/28 via with complaints of abdominal pain and diarrhea. The patient was admitted to the [facility] hospitalist service.  She presented to the ED after two weeks of worsening abdominal pain and diarrhea. Her workup in the [facility] ED did not demonstrate pancreatitis on CT abdomen. There was some concern for vasculitis for which she was seen by vascular surgery with recommendations for outpatient follow up.   Her TG were found to be  4030 on 8/31. She has a history of LPL deficiency and follows with [facility] endocrinology with Dr. Garrel Ridgel. She was started on an insulin/dextrose infusion on 9/3. TG [phone]. Lipase was remained WNL  Cardiology and Nephrology were consulted on 9/3 after her creatinine continued to rise and she began developing hypotension. EF was found to be 25-30% on ECHO with elevate LAP and RV volume overload. Cardiology has recommended the patient be transferred to [facility] to be started on NE and dobutamine.   Pertinent  Medical History  Astrocytoma, DMII, NAFLD, LPL deficiency, HTN, hypothyroidis, PCOS, CHF, chronic pancreatitis  Significant Hospital Events: Including procedures, antibiotic start and stop dates in addition to other pertinent events   8/28 Admit, CT abd pelvis no pancreatitis, concern for vasculitis. Seen by VS. Needs outpatient follow up. 8/30 CTA abd pelvis: no evidence of mesenteric ischemia. Un remarkable pancreas 9/2 Cards and Renal consulted. ECHO. LVEF 25-30%. LV severely decreased function. Grade II diastolic dysfunction. RVSF mild  reduced. Moderate to severe TR. TXR requested to [facility].   Interim History / Subjective:  No events. Some nausea this AM. Diarrhea eased up after lactulose Dc'd.  Objective   Blood pressure (!) 156/105, pulse (!) 122, temperature 97.6 F (36.4 C), temperature source Oral, resp. rate (!) 21, height 4' 9"  (1.448 m), weight 54.4 kg, last menstrual period 11/19/2020, SpO2 97 %. On RA        Intake/Output Summary (Last 24 hours) at 12/02/2020 0801 Last data filed at 12/02/2020 0759 Gross per 24 hour  Intake 798.85 ml  Output 1350 ml  Net -551.15 ml    Filed Weights   11/30/20 0500 12/01/20 0500 12/02/20 0500  Weight: 57.3 kg 54.4 kg 54.4 kg    Examination: No distress R ptosis Heart tachy 130s, sinus on monitor Abdomen soft Lungs more clear today No edema  Remains on milrinone Labs continue to improve  Resolved Hospital Problem list     Assessment & Plan:   Multifactorial acute liver injury (CHF, pancreatitis)- improving Similar multifactorial nonoliguric renal failure- improving Cardiogenic shock- improving TG induced acute on chronic pancreatitis- improved Hx astrocytoma post resection, mild residual L weakness HTN- now an issue along with sinus tachycardia DM2 with hyperglycemia TSH low, T4 normal Hx unspecified abdominal surgery Muscular deconditioning  - Adjust basal bolus insulin for CBG 100-180 - Fenofibrate and statin; to bring home repatha to resume - Inotrope wean, cardiac MRI per CHF team - HTN management per CHF team - Stable for transfer to progressive, remaining issues are (A) blood sugar control (B) stable TG (C) milrinone wean (D) any further cardiac workup  but CHF team  Best Practice (right click and "Reselect all SmartList Selections" daily)   Diet/type: low fat DVT prophylaxis: prophylactic heparin  GI prophylaxis: H2B Lines: N/A, plan to place central line. Foley:  N/A Code Status:  full code Last date of multidisciplinary goals of care  discussion [n/a]   Erskine Emery MD [facility] [place] epic messenger for cross cover needs If after hours, please call E-link

## 2020-12-02 NOTE — Progress Notes (Signed)
[facility] Progress Note Patient Name: Jennifer [name] DOB: Dec 24, 1990 MRN: [id number]   Date of Service  12/02/2020  HPI/Events of Note  K+ 6.3  eICU Interventions  Hyperkalemia treatment protocol orders entered.        Kerry Kass Arlesia Kiel 12/02/2020, 7:29 PM

## 2020-12-02 NOTE — Progress Notes (Signed)
[facility] Progress Note Patient Name: Jennifer [name] DOB: 1990-06-12 MRN: [id number]   Date of Service  12/02/2020  HPI/Events of Note  Patient with K+ of 5.8 following aggressive replacement therapy.  eICU Interventions  Discontinue further repletion of K, trend K+, Lokelma for further interval rise in the serum K+.        Kerry Kass Rob Mciver 12/02/2020, 5:29 AM

## 2020-12-02 NOTE — H&P (View-Only) (Signed)
Progress Note  Patient Name: Jennifer [name] Date of Encounter: 12/02/2020  Attending physician: Candee Furbish, MD Primary care provider: Sue Lush, PA-C Primary Cardiologist: Dr. Einar Gip Consultant:Peityn Payton Terri Skains, DO  Subjective: Jennifer [name] is a 30 y.o. female who was seen and examined at bedside  Denies chest pain. Shortness of breath at baseline.  LFTs and creatinine continue to improve. No family at bedside.  Case discussed and reviewed with her nurse.  Objective: Vital Signs in the last 24 hours: Temp:  [97.6 F (36.4 C)-98.4 F (36.9 C)] 98.1 F (36.7 C) (09/07 1124) Pulse Rate:  [113-130] 130 (09/07 0810) Resp:  [18-26] 18 (09/07 0810) BP: (131-161)/(85-116) 141/91 (09/07 1125) SpO2:  [93 %-100 %] 97 % (09/07 0810) Weight:  [54.4 kg] 54.4 kg (09/07 0500)  Intake/Output:  Intake/Output Summary (Last 24 hours) at 12/02/2020 1425 Last data filed at 12/02/2020 1100 Gross per 24 hour  Intake 408.62 ml  Output 1300 ml  Net -891.38 ml    Net IO Since Admission: -1,428.27 mL [12/02/20 1425]  Weights:  Filed Weights   11/30/20 0500 12/01/20 0500 12/02/20 0500  Weight: 57.3 kg 54.4 kg 54.4 kg    Telemetry: Personally reviewed - sinus tachycardia   Physical examination: PHYSICAL EXAM: Vitals with BMI 12/02/2020 12/02/2020 12/02/2020  Height - - -  Weight - - -  BMI - - -  Systolic 277 824 -  Diastolic 91 235 -  Pulse - 130 122   CONSTITUTIONAL: Appears older than stated age, well-developed and well-nourished. No acute distress.  Hemodynamically stable. SKIN: Skin is warm and dry. No rash noted. No cyanosis. No pallor. No jaundice HEAD: Normocephalic and atraumatic.  EYES: No scleral icterus MOUTH/THROAT: Moist oral membranes.  NECK: No JVP. No thyromegaly noted. No carotid bruits  LYMPHATIC: No visible cervical adenopathy.  CHEST Normal respiratory effort. No intercostal retractions  LUNGS: CTAB, no rales or stridor. No wheezes.  CARDIOVASCULAR:  Tachycardia, regular, positive T6-R4, soft systolic murmur heard over the left lower sternal border, no gallops or rubs. ABDOMINAL: Soft, nondistended, nontender, decreased bowel sounds in all 4 quadrants, EXTREMITIES: Right lower extremity more swollen compared to left (chronic per patient), +trace edema bilaterally, Warm to touch.  HEMATOLOGIC: No significant bruising NEUROLOGIC: Oriented to person, place, and time.  Right proptosis.  Left-sided weakness  PSYCHIATRIC: Normal mood and affect. Normal behavior. Cooperative  Lab Results: Hematology Recent Labs  Lab 11/30/20 0211 12/01/20 0303 12/02/20 0421  WBC 8.4 7.8 7.9  RBC 3.47* 3.98 4.06  HGB 10.2* 11.4* 11.3*  HCT 30.4* 34.4* 35.6*  MCV 87.6 86.4 87.7  MCH 29.4 28.6 27.8  MCHC 33.6 33.1 31.7  RDW 16.6* 16.4* 16.1*  PLT 123* 134* 136*    Chemistry Recent Labs  Lab 11/30/20 0500 12/01/20 0303 12/02/20 0421 12/02/20 0811  NA  --  135 133* 131*  K  --  3.6 5.8* 5.7*  CL  --  99 100 97*  CO2  --  27 26 25   GLUCOSE  --  75 182* 198*  BUN  --  33* 30* 28*  CREATININE  --  1.36* 1.30* 1.19*  CALCIUM  --  8.5* 9.3 9.3  PROT RESULTS UNAVAILABLE DUE TO INTERFERING SUBSTANCE 6.8 6.4*  --   ALBUMIN 3.2* 3.4* 3.0*  --   AST 501* 318* 123*  --   ALT 1,251* 1,174* 611*  --   ALKPHOS 139* 180* 135*  --   BILITOT 1.6* 1.4* 1.2  --   GFRNONAA  --  54* 57* >60  ANIONGAP  --  9 7 9      Cardiac Enzymes: Cardiac Panel (last 3 results) No results for input(s): CKTOTAL, CKMB, TROPONINIHS, RELINDX in the last 72 hours.   BNP (last 3 results) Recent Labs    11/23/20 0224 11/28/20 1511  BNP 48.9 529.6*    ProBNP (last 3 results) No results for input(s): PROBNP in the last 8760 hours.   DDimer No results for input(s): DDIMER in the last 168 hours.   Hemoglobin A1c:  Lab Results  Component Value Date   HGBA1C 7.8 (H) 11/23/2020   MPG 177.16 11/23/2020    TSH  Recent Labs    11/28/20 1511  TSH 0.098*    Lipid  Panel     Component Value Date/Time   CHOL 494 (H) 12/02/2020 0811   TRIG 944 (H) 12/02/2020 0811   HDL 24 (L) 12/02/2020 0811   CHOLHDL 20.6 12/02/2020 0811   VLDL UNABLE TO CALCULATE IF TRIGLYCERIDE OVER 400 mg/dL 12/02/2020 0811   LDLCALC UNABLE TO CALCULATE IF TRIGLYCERIDE OVER 400 mg/dL 12/02/2020 0811   LDLDIRECT 227.1 (H) 12/01/2020 2110    Imaging: MR CARDIAC MORPHOLOGY W WO CONTRAST  Result Date: 12/01/2020 CLINICAL DATA:  50F with chronic systolic heart failure p/w cardiogenic shock. Echo with EF 20-25% with moderate RV dysfunction Cardiac MRI  08/04/2017 ([facility]): 1. The left ventricle is normal in size and has moderately decreased function EF 42% There is mid to basal inferoseptal hypokinesis. The right ventricle is normal in size and has mildly decreased function. 2. There is evidence of subepicardial enhancement of the mid inferoseptal wall segment, which is compatible with myocarditis. There is also a very small focal subendocardial enhancement in the mid inferoseptum which is compatible with infarction. 3. There is evidence of myocardial edema of the mid epicardial inferoseptum which is compatible with acute myocarditis. 4. Small circumferential pericardial effusion EXAM: CARDIAC MRI TECHNIQUE: The patient was scanned on a 1.5 Tesla Siemens magnet. A dedicated cardiac coil was used. Functional imaging was done using Fiesta sequences. 2,3, and 4 chamber views were done to assess for RWMA's. Modified Simpson's rule using a short axis stack was used to calculate an ejection fraction on a dedicated work Conservation officer, nature. The patient received 6 cc of Gadavist. After 10 minutes inversion recovery sequences were used to assess for infiltration and scar tissue. CONTRAST:  6 cc  of Gadavist FINDINGS: Left ventricle: -Mild dilatation -Severe systolic dysfunction -Mild nonspecific ECV elevation (28%) -Normal global and regional T2 values -Midwall LGE in basal to mid  anterior/anterolateral walls LV EF:  29% (Normal 56-78%) Absolute volumes: LV EDV: 138m (Normal 52-141 mL) LV ESV: 1036m(Normal 13-51 mL) LV SV: 4230mNormal 33-97 mL) CO: 5.1L/min (Normal 2.7-6.0 L/min) Indexed volumes: LV EDV: 35m63m-m (Normal 41-81 mL/sq-m) LV ESV: 70mL13mm (Normal 12-21 mL/sq-m) LV SV: 28mL/62m (Normal 26-56 mL/sq-m) CI: 3.4L/min/sq-m (Normal 1.8-3.8 L/min/sq-m) Right ventricle: Normal size and systolic function RV EF: 55% (N44%al 47-80%) Absolute volumes: RV EDV: 89mL (31mal 58-154 mL) RV ESV: 40mL (N15ml 12-68 mL) RV SV: 49mL (No41m 35-98 mL) CO: 5.9L/min (Normal 2.7-6 L/min) Indexed volumes: RV EDV: 60mL/sq-m57mrmal 48-87 mL/sq-m) RV ESV: 27mL/sq-m 24mmal 11-28 mL/sq-m) RV SV: 33mL/sq-m (7mal 27-57 mL/sq-m) CI: 4.0L/min/sq-m (Normal 1.8-3.8 L/min/sq-m) Left atrium: Normal size Right atrium: Normal size Mitral valve: Mild regurgitation Aortic valve: Mild regurgitation Tricuspid valve: Moderate regurgitation Pulmonic valve: No regurgitation Aorta: Normal proximal ascending aorta Pericardium: Moderate effusion measuring up to 1cm adjacent to  RV free wall IMPRESSION: 1. Midwall late gadolinium enhancement in basal to mid anterior/anterolateral walls. Differential diagnosis includes prior myocarditis (normal T2 values suggests no evidence of acute myocarditis) versus sarcoidosis. Would recommend cardiac PET to evaluate for sarcoid. 2.  Mild LV dilatation with severe systolic dysfunction (EF 01%) 3.  Normal RV size and systolic function (EF 75%) 4. Moderate pericardial effusion measuring up to 1cm adjacent to RV free wall Electronically Signed   By: Oswaldo Milian M.D.   On: 12/01/2020 23:21    Cardiac database: EKG: 11/14/2020: Sinus tachycardia, 107 bpm, poor R wave progression, without underlying injury pattern.Compared to prior ECG 10/07/2020 ventricular rate has improved.   Echocardiogram: 11/28/2020:  1. Left ventricular ejection fraction, by estimation, is 25 to 30%.  The left ventricle has severely decreased function. The left ventricle demonstrates global hypokinesis. Left ventricular diastolic parameters are  consistent with Grade II diastolic dysfunction (pseudonormalization). Elevated left atrial pressure. There is the interventricular septum is flattened in diastole ('D' shaped left ventricle), consistent with right ventricular volume overload.   2. Right ventricular systolic function is mildly reduced. The right ventricular size is normal. There is mildly elevated pulmonary artery systolic pressure.   3. The mitral valve is normal in structure. Mild to moderate mitral valve regurgitation.   4. Tricuspid valve regurgitation is moderate to severe.   5. The aortic valve is normal in structure. Aortic valve regurgitation is  mild.    Stress Testing:  Lexiscan (Walking with mod Bruce) Sestamibi Stress Test 05/08/2019: Nondiagnostic ECG stress. Myocardial perfusion is normal. LV is normal in size both in rest and stress images. Stress LV EF:  calculated at 38% but visually normal. No previous exam available for comparison. Low risk study.   Scheduled Meds:  Chlorhexidine Gluconate Cloth  6 each Topical Daily   cholecalciferol  1,000 Units Oral Daily   digoxin  0.125 mg Oral Daily   DULoxetine  60 mg Oral Daily   famotidine  10 mg Oral Daily   fenofibrate  160 mg Oral Daily   heparin injection (subcutaneous)  5,000 Units Subcutaneous Q8H   insulin aspart  0-15 Units Subcutaneous TID WC   insulin aspart  10 Units Subcutaneous TID WC   insulin detemir  33 Units Subcutaneous BID   levothyroxine  125 mcg Oral Q0600   lipase/protease/amylase  24,000 Units Oral TID WC   multivitamin with minerals  1 tablet Oral Daily   norethindrone-ethinyl estradiol  1 tablet Oral Daily   rosuvastatin  40 mg Oral Daily   sacubitril-valsartan  1 tablet Oral BID   sodium bicarbonate  650 mg Oral TID   sodium chloride flush  10-40 mL Intracatheter Q12H   sodium chloride  flush  3 mL Intravenous Q12H    Continuous Infusions:  sodium chloride     sodium chloride Stopped (12/01/20 1212)    PRN Meds: sodium chloride, diphenhydrAMINE, HYDROmorphone (DILAUDID) injection, lipase/protease/amylase, ondansetron (ZOFRAN) IV, oxyCODONE-acetaminophen, phenol, prochlorperazine, senna-docusate, sodium chloride flush, sodium chloride flush   IMPRESSION & RECOMMENDATIONS: Jennifer [name] is a 30 y.o. 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  Acute on chronic systolic heart failure stage C, NYHA class II/III: Most likely secondary to nonischemic cardiomyopathy  Echocardiogram 03/2020: LVEF 35-40% with moderate MR. Echocardiogram 11/28/2020:  LVEF 25-30%, RV dysfunction, moderate to severe TR. Cardiac MRI results reviewed.  Off of inotropic support. Tolerating initiation of Entresto well. Will discuss with heart failure team with regards to initiation of beta-blockers in the next 24 to 48 hours. Given the new diagnosis of severely reduced LVEF, insulin-dependent diabetes mellitus type 1, and hypertriglyceridemia the procedure of left and right heart catheterization with possible intervention was explained to the patient in detail.  The indication, alternatives, risks and benefits were reviewed.  Complications include but not limited to bleeding, infection, vascular injury, stroke, myocardial infection, arrhythmia, kidney injury requiring short-term/long-term hemodialysis, radiation-related injury in the case of prolonged fluoroscopy use, emergency cardiac surgery, and death. The patient understands the risks of serious complication is 1-2 in 0923 with diagnostic cardiac cath and 1-2%  or less with angioplasty/stenting. The patient voices understanding and provides verbal feedback and wishes to proceed with coronary angiography with possible PCI.  Nonischemic cardiomyopathy: See above  Acute kidney injury: Improving.  Most likely secondary to ischemic ATN, polypharmacy, recent contrast exposure, cardiorenal physiology. Monitor BUN and Cr.   Shock liver secondary to acute/chronic heart failure with reduced EF: AST and ALT improving Monitor for now.  Hypertriglyceridemia with lipoprotein lipase deficiency: TG on arrival 4040 Trending up as of this morning.  Currently on insulin drip. Management per CCM.   Hypothyroidism: Currently on Synthroid. Will defer to CCM  Type 1 diabetes mellitus with hypertriglyceridemia and circulatory complication: Most recent hemoglobin A1c 7.8. Currently on insulin drip. Monitor serum glucose.  Patient's questions and concerns were addressed to her satisfaction. She voices understanding of the instructions provided during this encounter.   This note was created using a voice recognition software as a result there may be grammatical errors inadvertently enclosed that do not reflect the nature of this encounter. Every attempt is made to correct such errors.  Total time spent : 25 minutes.   Rex Kras, [place], [facility]  Pager: [phone] Office: [phone]

## 2020-12-03 ENCOUNTER — Encounter (HOSPITAL_COMMUNITY): Payer: Self-pay | Admitting: Cardiology

## 2020-12-03 ENCOUNTER — Encounter (HOSPITAL_COMMUNITY)
Admission: EM | Disposition: A | Discharge status: Home / Self Care | Payer: Self-pay | Source: Home / Self Care | Attending: Internal Medicine

## 2020-12-03 DIAGNOSIS — K7200 Acute and subacute hepatic failure without coma: Secondary | ICD-10-CM | POA: Diagnosis not present

## 2020-12-03 DIAGNOSIS — I509 Heart failure, unspecified: Secondary | ICD-10-CM | POA: Diagnosis not present

## 2020-12-03 DIAGNOSIS — N179 Acute kidney failure, unspecified: Secondary | ICD-10-CM | POA: Diagnosis not present

## 2020-12-03 DIAGNOSIS — I428 Other cardiomyopathies: Secondary | ICD-10-CM | POA: Diagnosis not present

## 2020-12-03 DIAGNOSIS — R109 Unspecified abdominal pain: Secondary | ICD-10-CM | POA: Diagnosis not present

## 2020-12-03 DIAGNOSIS — I5041 Acute combined systolic (congestive) and diastolic (congestive) heart failure: Secondary | ICD-10-CM | POA: Diagnosis not present

## 2020-12-03 DIAGNOSIS — E781 Pure hyperglyceridemia: Secondary | ICD-10-CM | POA: Diagnosis not present

## 2020-12-03 HISTORY — PX: RIGHT/LEFT HEART CATH AND CORONARY ANGIOGRAPHY: CATH118266

## 2020-12-03 LAB — POCT I-STAT 7, (LYTES, BLD GAS, ICA,H+H)
Acid-Base Excess: 1 mmol/L (ref 0.0–2.0)
Bicarbonate: 25.2 mmol/L (ref 20.0–28.0)
Calcium, Ion: 1.22 mmol/L (ref 1.15–1.40)
HCT: 35 % — ABNORMAL LOW (ref 36.0–46.0)
Hemoglobin: 11.9 g/dL — ABNORMAL LOW (ref 12.0–15.0)
O2 Saturation: 97 %
Potassium: 4.5 mmol/L (ref 3.5–5.1)
Sodium: 136 mmol/L (ref 135–145)
TCO2: 26 mmol/L (ref 22–32)
pCO2 arterial: 39.7 mmHg (ref 32.0–48.0)
pH, Arterial: 7.41 (ref 7.350–7.450)
pO2, Arterial: 85 mmHg (ref 83.0–108.0)

## 2020-12-03 LAB — BASIC METABOLIC PANEL
Anion gap: 11 (ref 5–15)
BUN: 33 mg/dL — ABNORMAL HIGH (ref 6–20)
CO2: 25 mmol/L (ref 22–32)
Calcium: 9.8 mg/dL (ref 8.9–10.3)
Chloride: 97 mmol/L — ABNORMAL LOW (ref 98–111)
Creatinine, Ser: 1.29 mg/dL — ABNORMAL HIGH (ref 0.44–1.00)
GFR, Estimated: 58 mL/min — ABNORMAL LOW (ref >60)
Glucose, Bld: 102 mg/dL — ABNORMAL HIGH (ref 70–99)
Potassium: 4.4 mmol/L (ref 3.5–5.1)
Sodium: 133 mmol/L — ABNORMAL LOW (ref 135–145)

## 2020-12-03 LAB — POCT I-STAT EG7
Acid-Base Excess: 1 mmol/L (ref 0.0–2.0)
Bicarbonate: 26.3 mmol/L (ref 20.0–28.0)
Calcium, Ion: 1.24 mmol/L (ref 1.15–1.40)
HCT: 36 % (ref 36.0–46.0)
Hemoglobin: 12.2 g/dL (ref 12.0–15.0)
O2 Saturation: 76 %
Potassium: 4.6 mmol/L (ref 3.5–5.1)
Sodium: 136 mmol/L (ref 135–145)
TCO2: 27 mmol/L (ref 22–32)
pCO2, Ven: 41.3 mmHg — ABNORMAL LOW (ref 44.0–60.0)
pH, Ven: 7.412 (ref 7.250–7.430)
pO2, Ven: 40 mmHg (ref 32.0–45.0)

## 2020-12-03 LAB — CBC
HCT: 38.2 % (ref 36.0–46.0)
Hemoglobin: 12 g/dL (ref 12.0–15.0)
MCH: 27.5 pg (ref 26.0–34.0)
MCHC: 31.4 g/dL (ref 30.0–36.0)
MCV: 87.6 fL (ref 80.0–100.0)
Platelets: 141 10*3/uL — ABNORMAL LOW (ref 150–400)
RBC: 4.36 MIL/uL (ref 3.87–5.11)
RDW: 15.7 % — ABNORMAL HIGH (ref 11.5–15.5)
WBC: 8.7 10*3/uL (ref 4.0–10.5)
nRBC: 0 % (ref 0.0–0.2)

## 2020-12-03 LAB — COOXEMETRY PANEL
Carboxyhemoglobin: 1.8 % — ABNORMAL HIGH (ref 0.5–1.5)
Methemoglobin: 1.2 % (ref 0.0–1.5)
O2 Saturation: 62.3 %
Total hemoglobin: 11.8 g/dL — ABNORMAL LOW (ref 12.0–16.0)

## 2020-12-03 LAB — LIPID PANEL
Cholesterol: 425 mg/dL — ABNORMAL HIGH (ref 0–200)
HDL: 23 mg/dL — ABNORMAL LOW (ref >40)
LDL Cholesterol: UNDETERMINED mg/dL (ref 0–99)
Total CHOL/HDL Ratio: 18.5 RATIO
Triglycerides: 681 mg/dL — ABNORMAL HIGH (ref <150)
VLDL: UNDETERMINED mg/dL (ref 0–40)

## 2020-12-03 LAB — GLUCOSE, CAPILLARY
Glucose-Capillary: 83 mg/dL (ref 70–99)
Glucose-Capillary: 87 mg/dL (ref 70–99)
Glucose-Capillary: 237 mg/dL — ABNORMAL HIGH (ref 70–99)
Glucose-Capillary: 166 mg/dL — ABNORMAL HIGH (ref 70–99)
Glucose-Capillary: 141 mg/dL — ABNORMAL HIGH (ref 70–99)

## 2020-12-03 LAB — PHOSPHORUS: Phosphorus: 6.3 mg/dL — ABNORMAL HIGH (ref 2.5–4.6)

## 2020-12-03 LAB — POTASSIUM
Potassium: 4.7 mmol/L (ref 3.5–5.1)
Potassium: 4.7 mmol/L (ref 3.5–5.1)
Potassium: 4.4 mmol/L (ref 3.5–5.1)

## 2020-12-03 LAB — MAGNESIUM: Magnesium: 2.4 mg/dL (ref 1.7–2.4)

## 2020-12-03 LAB — TRIGLYCERIDES: Triglycerides: 719 mg/dL — ABNORMAL HIGH (ref <150)

## 2020-12-03 LAB — LDL CHOLESTEROL, DIRECT
Direct LDL: 250.9 mg/dL — ABNORMAL HIGH (ref 0–99)
Direct LDL: 233.5 mg/dL — ABNORMAL HIGH (ref 0–99)

## 2020-12-03 SURGERY — RIGHT/LEFT HEART CATH AND CORONARY ANGIOGRAPHY
Anesthesia: LOCAL

## 2020-12-03 MED ORDER — MIDAZOLAM HCL 2 MG/2ML IJ SOLN
INTRAMUSCULAR | Status: DC | PRN
  Administered 2020-12-03 (×3): 1 mg via INTRAVENOUS

## 2020-12-03 MED ORDER — HEPARIN (PORCINE) IN NACL 1000-0.9 UT/500ML-% IV SOLN
INTRAVENOUS | Status: DC | PRN
  Administered 2020-12-03 (×2): 500 mL

## 2020-12-03 MED ORDER — SODIUM CHLORIDE 0.9% FLUSH: 3.0000 mL | INTRAVENOUS | Status: DC | PRN

## 2020-12-03 MED ORDER — MIDAZOLAM HCL 2 MG/2ML IJ SOLN
INTRAMUSCULAR | Status: AC
  Filled 2020-12-03: qty 2

## 2020-12-03 MED ORDER — SODIUM CHLORIDE 0.9% FLUSH
3.0000 mL | Freq: Two times a day (BID) | INTRAVENOUS | Status: DC
  Administered 2020-12-04: 3 mL via INTRAVENOUS

## 2020-12-03 MED ORDER — LIDOCAINE HCL (PF) 1 % IJ SOLN
INTRAMUSCULAR | Status: AC
  Filled 2020-12-03: qty 30

## 2020-12-03 MED ORDER — SACUBITRIL-VALSARTAN 49-51 MG PO TABS
1.0000 | ORAL_TABLET | Freq: Two times a day (BID) | ORAL | Status: DC
  Administered 2020-12-03 – 2020-12-06 (×7): 1 via ORAL
  Filled 2020-12-03 (×8): qty 1

## 2020-12-03 MED ORDER — CARVEDILOL 3.125 MG PO TABS
3.1250 mg | ORAL_TABLET | Freq: Two times a day (BID) | ORAL | Status: DC
  Administered 2020-12-03 – 2020-12-06 (×6): 3.125 mg via ORAL
  Filled 2020-12-03 (×6): qty 1

## 2020-12-03 MED ORDER — SODIUM CHLORIDE 0.9 % IV SOLN: INTRAVENOUS | Status: DC

## 2020-12-03 MED ORDER — IVABRADINE HCL 5 MG PO TABS
5.0000 mg | ORAL_TABLET | Freq: Two times a day (BID) | ORAL | Status: DC
  Administered 2020-12-03 – 2020-12-06 (×6): 5 mg via ORAL
  Filled 2020-12-03 (×8): qty 1

## 2020-12-03 MED ORDER — ASPIRIN 81 MG PO CHEW
81.0000 mg | CHEWABLE_TABLET | Freq: Once | ORAL | Status: AC
  Administered 2020-12-03: 81 mg via ORAL
  Filled 2020-12-03: qty 1

## 2020-12-03 MED ORDER — ONDANSETRON HCL 4 MG/2ML IJ SOLN
INTRAMUSCULAR | Status: AC
  Filled 2020-12-03: qty 2

## 2020-12-03 MED ORDER — SODIUM CHLORIDE 0.9 % IV SOLN: 250.0000 mL | INTRAVENOUS | Status: DC | PRN

## 2020-12-03 MED ORDER — LIDOCAINE HCL (PF) 1 % IJ SOLN
INTRAMUSCULAR | Status: DC | PRN
  Administered 2020-12-03: 15 mL via INTRADERMAL

## 2020-12-03 MED ORDER — HEPARIN (PORCINE) IN NACL 1000-0.9 UT/500ML-% IV SOLN
INTRAVENOUS | Status: AC
  Filled 2020-12-03: qty 500

## 2020-12-03 MED ORDER — ACETAMINOPHEN 325 MG PO TABS: 650.0000 mg | ORAL_TABLET | ORAL | Status: DC | PRN

## 2020-12-03 MED ORDER — ONDANSETRON HCL 4 MG/2ML IJ SOLN
INTRAMUSCULAR | Status: DC | PRN
  Administered 2020-12-03: 4 mg via INTRAVENOUS

## 2020-12-03 MED ORDER — ASPIRIN EC 81 MG PO TBEC
81.0000 mg | DELAYED_RELEASE_TABLET | Freq: Every day | ORAL | Status: DC
  Administered 2020-12-04 – 2020-12-06 (×3): 81 mg via ORAL
  Filled 2020-12-03 (×3): qty 1

## 2020-12-03 MED ORDER — SODIUM CHLORIDE 0.9% FLUSH
3.0000 mL | Freq: Two times a day (BID) | INTRAVENOUS | Status: DC
  Administered 2020-12-04 – 2020-12-05 (×2): 3 mL via INTRAVENOUS

## 2020-12-03 MED ORDER — IOHEXOL 350 MG/ML SOLN
INTRAVENOUS | Status: DC | PRN
  Administered 2020-12-03: 10 mL via INTRA_ARTERIAL

## 2020-12-03 MED ORDER — PREGABALIN 100 MG PO CAPS
300.0000 mg | ORAL_CAPSULE | Freq: Two times a day (BID) | ORAL | Status: DC
  Administered 2020-12-03 – 2020-12-04 (×4): 300 mg via ORAL
  Filled 2020-12-03: qty 4
  Filled 2020-12-03 (×4): qty 3

## 2020-12-03 SURGICAL SUPPLY — 12 items
CATH BALLN WEDGE 5F 110CM (CATHETERS) ×1 IMPLANT
CATH INFINITI 5FR JL4 (CATHETERS) ×1 IMPLANT
CATH INFINITI 5FR MPB2 (CATHETERS) ×1 IMPLANT
GLIDESHEATH SLEND A-KIT 6F 22G (SHEATH) IMPLANT
KIT HEART LEFT (KITS) ×2 IMPLANT
KIT MICROPUNCTURE NIT STIFF (SHEATH) ×1 IMPLANT
PACK CARDIAC CATHETERIZATION (CUSTOM PROCEDURE TRAY) ×2 IMPLANT
SHEATH GLIDE SLENDER 4/5FR (SHEATH) IMPLANT
SHEATH PINNACLE 6F 10CM (SHEATH) ×1 IMPLANT
TRANSDUCER W/STOPCOCK (MISCELLANEOUS) ×2 IMPLANT
TUBING CIL FLEX 10 FLL-RA (TUBING) ×2 IMPLANT
WIRE EMERALD 3MM-J .035X150CM (WIRE) ×1 IMPLANT

## 2020-12-03 NOTE — Interval H&P Note (Signed)
History and Physical Interval Note:  12/03/2020 6:35 AM  Jennifer [name]  has presented today for surgery, with the diagnosis of cardiomyopathy.  The various methods of treatment have been discussed with the patient and family. After consideration of risks, benefits and other options for treatment, the patient has consented to  Procedure(s): RIGHT/LEFT HEART CATH AND CORONARY ANGIOGRAPHY (N/A) and possible angioplasty as a surgical intervention.  The patient's history has been reviewed, patient examined, no change in status, stable for surgery.  I have reviewed the patient's chart and labs.  Questions were answered to the patient's satisfaction.   Cath Lab Visit (complete for each Cath Lab visit)  Clinical Evaluation Leading to the Procedure:   ACS: Yes.    Non-ACS:    Anginal Classification: CCS IV  Anti-ischemic medical therapy: Minimal Therapy (1 class of medications)  Non-Invasive Test Results: No non-invasive testing performed  Prior CABG: No previous CABG   Jennifer [name]

## 2020-12-03 NOTE — Progress Notes (Addendum)
SITE AREA: right groin/right femoral  SITE PRIOR TO REMOVAL:  LEVEL 0  PRESSURE APPLIED FOR: appoximately 20 minutes (see below)  MANUAL: yes  PATIENT STATUS DURING PULL: stable  POST PULL SITE:  LEVEL 0  POST PULL INSTRUCTIONS GIVEN: yes  POST PULL PULSES PRESENT: bilateral pedal pulses +2  DRESSING APPLIED: gauze with tegaderm  BEDREST BEGINS @ 0930  COMMENTS:  arterial sheath removed first, pressure held for approximately 20 minutes, veinous sheath removed 2nd (10 minutes into arterial sheath removal) and pressure applied for approximately 10 minutes Sheath removed by 2H RN, Mancel Bale, RN

## 2020-12-03 NOTE — Progress Notes (Signed)
PT Cancellation Note  Patient Details Name: Jennifer [name] MRN: [id number] DOB: 09-Oct-1990   Cancelled Treatment:    Reason Eval/Treat Not Completed: Patient at procedure or test/unavailable (heart cath). Will follow-up for PT treatment as schedule permits.  Mabeline Caras, PT, DPT Acute Rehabilitation Services  Pager [phone] Office [place] 12/03/2020, 8:57 AM

## 2020-12-03 NOTE — Progress Notes (Signed)
PROGRESS NOTE    Jennifer [name]   YQI:[id number]  DOB: May 20, 1990  PCP: Jennifer Lush, PA-C    DOA: 11/22/2020 LOS: 65    Brief Narrative / Hospital Course to Date:   Jennifer [name] is a 30 yo female with PMH astrocytoma, DMII, NAFLD, LPL deficiency, HTN, hypothyroidism, PCOS, sCHF, chronic pancreatitis who presented with progressive worsening abdominal pain x 2 weeks and diarrhea, pt reported similar to prior pancreatitis.  No associated N/V.  Evaluation in the ED showed normal lipase and no signs of pancreatitis on CT abdomen/pelvis.  Due to CT findings distal aorta and bilateral iliac arteries concerning for possible vasculitis, patient was seen by vascular surgery.  Recommended outpatient follow up.  On 8/31, lipid panel showed severe hypertriglyceridemia with TG's 4030.  Hx of lipoprotein lipase deficiency, followed by endocrinology at [facility] (Dr. Garrel Ridgel).  Treated with insulin/dextrose infusion.  TG's trended down.  Lipase remained normal.    On 9/3 patient developed hypotension and renal function was worsening.  Nephrology and Cardiology were consulted.  Echo showed EF down at 25-30% with elevated LAP and RV volume overload.  Patient was transferred to [facility] and required Levophed and dobutamine in ICU.   follows annually with neurosurgery for her history of astrocytoma as a child which required chemo and may have contributed to her underlying CHF.    follows with endocrinology and cardiology for her diabetes and CHF respectively.   Assessment & Plan   Principal Problem:   Abdominal pain Active Problems:   DM (diabetes mellitus) (HCC)   Hypothyroidism   Chronic systolic CHF (congestive heart failure) (HCC)   Essential hypertension   Hypertriglyceridemia   Prolonged QT interval   AKI (acute kidney injury) ([place])   Hypotension   Acute combined systolic and diastolic heart failure (HCC)   Nonischemic cardiomyopathy (HCC)   Acute decompensated heart failure (HCC)    Elevated liver enzymes   Cardiogenic shock (HCC)   Shock liver   Diarrhea   Acute on Chronic Systolic CHF  >> Cardiogenic shock Non-ischemic cardiomyopathy Echo 1/22: EF 35-40% Mod MR Echo 11/28/20 20-25% with moderate RV dysfunction and mild septal flattening. Mod-severe TR. Cardiac MRI findings consistent with prior myocarditis vs sarcoidosis  L/R Cath 12/03/20 without significant CAD to any degree that would explain low EF, no PCI. --Mgmt per Heart Failure team --Off milrinone and hemodynamically stable.  --s/p diuresis --On Entresto, digoxin, ivabradine --Low dose Coreg started w/shock resolved --Consider Aldactone (K has been up) --Given type 1 DM, no SLGT2 inhibitor --Follow rheum serologgies --Cardiology has discussed with [facility] re potential need for advanced therapies including VAD  Abdominal pain due to  Hypertriglyceridemia >> Acute on Chronic Pancreatitis - POA.  Treated with insulin/dextrose infusion and TG's down-trending.  Abdominal pain improved. --continue fenofibrate and statin --pt to bring home Repatha or resume on d/c  Transaminitis - due to shock liver.  LFT's improving.  Monitor LFT's  AKI - due to cardiogenic shock. Renal function improving.  Monitor BMP.  Type 1 Diabetes with Hyperglycemia - adjusting insulin for inpatient goal 140-180.    Generalized weakness / Physical Deconditioning - PT and OT evaluations, follow up recommendations.  Hypertension - on cardiac meds above.  Mgmt per cardiology.  Monitor BP.    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   Mild residual Left-sided weakness. ?cardiotoxicity from remote chemo?  Lipoprotein lipase deficiency - follows with endocrinology at [facility].  High-risk for TG-induced pancreatitis.  Treated as above.  Hypothyroidism - on levothyroxine  Abnormal TSH - TSH low, free T4 normal.  Recheck in outpatient follow up.  Gastroparesis / Delayed gastric emptying - pt had gastric emptying  study 08/25/20 showing 53% at 4 hours.    Neuropathy - resume Lyrica  Patient BMI: Body mass index is 25.62 kg/m.   DVT prophylaxis: heparin injection 5,000 Units Start: 11/27/20 1100   Diet:  Diet Orders (From admission, onward)     Start     Ordered   12/03/20 1003  Diet renal/carb modified with fluid restriction Diet-HS Snack? Nothing; Fluid restriction: 1200 mL Fluid; Room service appropriate? Yes; Fluid consistency: Thin  Diet effective now       Question Answer Comment  Diet-HS Snack? Nothing   Fluid restriction: 1200 mL Fluid   Room service appropriate? Yes   Fluid consistency: Thin      12/03/20 1002              Code Status: Full Code   Subjective 12/03/20    Pt seen with husband at bedside today ICU.  No SOB or CP.  Requests Lyrica resumed because of uncontrolled pain in both legs.  Tolerated cath procedure well.  No other acute complaints.     Disposition Plan & Communication   Status is: Inpatient  Remains inpatient appropriate because:Inpatient level of care appropriate due to severity of illness.  PT/OT evaluations pending.  D/C pending cardiology clearance, medication regimen being adjusted.  Dispo: The patient is from: Home              Anticipated d/c is to: Home              Patient currently is not medically stable to d/c.   Difficult to place patient No   Family Communication: husband at bedside on rounds    Consults, Procedures, Significant Events   Consultants:  Cardiology / Heart Failure Nephrology PCCM Gastroenterology   Procedures:  L & R Heart Cath 12/03/20 - see report   Antimicrobials:  Anti-infectives (From admission, onward)    None         Micro    Objective   Vitals:   12/03/20 1200 12/03/20 1300 12/03/20 1400 12/03/20 1500  BP: 112/82 121/82 (!) 127/95 117/86  Pulse: (!) 108 95 (!) 103   Resp: 18 19 18 19   Temp:      TempSrc:      SpO2: 97% 98% 98%   Weight:      Height:        Intake/Output Summary  (Last 24 hours) at 12/03/2020 1628 Last data filed at 12/03/2020 1300 Gross per 24 hour  Intake 262.07 ml  Output 900 ml  Net -637.93 ml   Filed Weights   12/01/20 0500 12/02/20 0500 12/03/20 0100  Weight: 54.4 kg 54.4 kg 53.7 kg    Physical Exam:  General exam: awake, alert, no acute distress HEENT: moist mucus membranes, hearing grossly normal  Respiratory system: CTAB, no wheezes, rales or rhonchi, normal respiratory effort. Cardiovascular system: normal S1/S2, RRR   Gastrointestinal system: soft, NT, ND. Central nervous system: A&O x4. no gross focal neurologic deficits, normal speech Skin: dry, intact, normal temperature, normal color, No rashes, lesions or ulcers seen on visualized skin Psychiatry: normal mood, congruent affect, judgement and insight appear normal  Labs   Data Reviewed: I have personally reviewed following labs and imaging studies  CBC: Recent Labs  Lab 11/27/20 0716 11/28/20 0218 11/28/20 2256 11/29/20 0101 11/30/20 0211 12/01/20  0303 12/02/20 0421 12/03/20 0340 12/03/20 0819  WBC 10.7* 9.8 11.5* 11.0* 8.4 7.8 7.9 8.7  --   NEUTROABS 7.3 6.7 9.4* 8.8*  --   --   --   --   --   HGB 10.9* 10.2* 9.3* 9.6* 10.2* 11.4* 11.3* 12.0 12.2  11.9*  HCT 33.4* 32.2* 28.7* 29.6* 30.4* 34.4* 35.6* 38.2 36.0  35.0*  MCV 85.9 89.0 87.0 87.3 87.6 86.4 87.7 87.6  --   PLT 156 119* 91* 95* 123* 134* 136* 141*  --    Basic Metabolic Panel: Recent Labs  Lab 11/29/20 0101 11/29/20 3428 11/30/20 0211 12/01/20 0303 12/02/20 0421 12/02/20 7681 12/02/20 1744 12/02/20 2108 12/02/20 2313 12/03/20 0340 12/03/20 0819 12/03/20 1033  NA 128*   < > 134* 135 133* 131* 132* 132*  --  133* 136  136  --   K 4.5   < > 3.8 3.6 5.8* 5.7* 6.3* 4.8 4.7 4.4 4.6  4.5 4.7  CL 100   < > 100 99 100 97* 98 97*  --  97*  --   --   CO2 14*   < > 17* 27 26 25 23 26   --  25  --   --   GLUCOSE 209*   < > 228* 75 182* 198* 201* 195*  --  102*  --   --   BUN 67*   < > 47* 33* 30* 28*  30* 32*  --  33*  --   --   CREATININE 3.38*   < > 2.12* 1.36* 1.30* 1.19* 1.27* 1.18*  --  1.29*  --   --   CALCIUM 7.6*   < > 7.9* 8.5* 9.3 9.3 9.9 9.6  --  9.8  --   --   MG 2.4  --  2.0 1.8 2.4  --   --   --   --  2.4  --   --   PHOS  --   --  2.9 4.0 4.6  --   --  6.0*  --  6.3*  --   --    < > = values in this interval not displayed.   GFR: Estimated Creatinine Clearance: 45.3 mL/min (A) (by C-G formula based on SCr of 1.29 mg/dL (H)). Liver Function Tests: Recent Labs  Lab 11/29/20 0101 11/29/20 0614 11/30/20 0500 12/01/20 0303 12/02/20 0421  AST 1,628* 1,252* 501* 318* 123*  ALT 1,657* 1,671* 1,251* 1,174* 611*  ALKPHOS 121 124 139* 180* 135*  BILITOT 2.5* 2.6* 1.6* 1.4* 1.2  PROT 5.7* 6.0* RESULTS UNAVAILABLE DUE TO INTERFERING SUBSTANCE 6.8 6.4*  ALBUMIN 2.9* 2.9* 3.2* 3.4* 3.0*   Recent Labs  Lab 11/27/20 1038  LIPASE 17   Recent Labs  Lab 11/28/20 1511  AMMONIA 67*   Coagulation Profile: Recent Labs  Lab 11/28/20 1817  INR 1.5*   Cardiac Enzymes: No results for input(s): CKTOTAL, CKMB, CKMBINDEX, TROPONINI in the last 168 hours. BNP (last 3 results) No results for input(s): PROBNP in the last 8760 hours. HbA1C: No results for input(s): HGBA1C in the last 72 hours. CBG: Recent Labs  Lab 12/02/20 2313 12/03/20 0609 12/03/20 0857 12/03/20 1116 12/03/20 1609  GLUCAP 174* 83 87 237* 166*   Lipid Profile: Recent Labs    12/02/20 2108 12/03/20 0340 12/03/20 1033  CHOL 562*  --  425*  HDL 28*  --  23*  LDLCALC UNABLE TO CALCULATE IF TRIGLYCERIDE OVER 400 mg/dL  --  UNABLE TO  CALCULATE IF TRIGLYCERIDE OVER 400 mg/dL  TRIG 873* 719* 681*  CHOLHDL 20.1  --  18.5  LDLDIRECT 250.9*  --  233.5*   Thyroid Function Tests: No results for input(s): TSH, T4TOTAL, FREET4, T3FREE, THYROIDAB in the last 72 hours. Anemia Panel: No results for input(s): VITAMINB12, FOLATE, FERRITIN, TIBC, IRON, RETICCTPCT in the last 72 hours. Sepsis Labs: Recent Labs   Lab 11/28/20 1511  LATICACIDVEN 1.3    Recent Results (from the past 240 hour(s))  Gastrointestinal Panel by PCR , Stool     Status: Abnormal   Collection Time: 11/24/20  3:50 PM   Specimen: Rectum; Stool  Result Value Ref Range Status   Campylobacter species NOT DETECTED NOT DETECTED Final   Plesimonas shigelloides NOT DETECTED NOT DETECTED Final   Salmonella species NOT DETECTED NOT DETECTED Final   Yersinia enterocolitica NOT DETECTED NOT DETECTED Final   Vibrio species NOT DETECTED NOT DETECTED Final   Vibrio cholerae NOT DETECTED NOT DETECTED Final   Enteroaggregative E coli (EAEC) NOT DETECTED NOT DETECTED Final   Enteropathogenic E coli (EPEC) DETECTED (A) NOT DETECTED Final    Comment: RESULT CALLED TO, READ BACK BY AND VERIFIED WITH: MEGAN BULLINS 11/26/20 1349 KLW    Enterotoxigenic E coli (ETEC) NOT DETECTED NOT DETECTED Final   Shiga like toxin producing E coli (STEC) NOT DETECTED NOT DETECTED Final   Shigella/Enteroinvasive E coli (EIEC) NOT DETECTED NOT DETECTED Final   Cryptosporidium NOT DETECTED NOT DETECTED Final   Cyclospora cayetanensis NOT DETECTED NOT DETECTED Final   Entamoeba histolytica NOT DETECTED NOT DETECTED Final   Giardia lamblia NOT DETECTED NOT DETECTED Final   Adenovirus F40/41 NOT DETECTED NOT DETECTED Final   Astrovirus NOT DETECTED NOT DETECTED Final   Norovirus GI/GII NOT DETECTED NOT DETECTED Final   Rotavirus A NOT DETECTED NOT DETECTED Final   Sapovirus (I, II, IV, and V) NOT DETECTED NOT DETECTED Final    Comment: Performed at [facility], [address] [place], [place] [address]  MRSA Next Gen by PCR, Nasal     Status: None   Collection Time: 11/27/20 10:07 PM   Specimen: Nasal Mucosa; Nasal Swab  Result Value Ref Range Status   MRSA by PCR Next Gen NOT DETECTED NOT DETECTED Final    Comment: (NOTE) The GeneXpert MRSA Assay (FDA approved for NASAL specimens only), is one component of a comprehensive MRSA colonization  surveillance program. It is not intended to diagnose MRSA infection nor to guide or monitor treatment for MRSA infections. Test performance is not FDA approved in patients less than 52 years old. Performed at [facility], [place] [address] [place], [place] [address]       Imaging Studies   CARDIAC CATHETERIZATION  Result Date: 12/03/2020 Right and left heart catheterization 12/03/2020: Normal right heart catheterization pressures.  RA 4/1, mean 1; RV 23/4, EDP 6; PA 22/8, mean 11 and PA saturation 76%.  PW 9/7, mean 7 mmHg. CO 5.6, CI 3.88.  QP/QS 1.00. LV: 136/9, EDP 17 mmHg.  Ao 131/86, mean 105 mmHg.  No pressure gradient across the aortic valve. RCA: A very small but dominant RCA which is diffusely diseased around 80%.  Mid segment 99% occlusion.  Conus branch collateralizes distal RCA. LM: Large, smooth and normal. LAD: Large vessel, minimal diffuse disease.  Small to moderate-sized D1 and D2. CX: Large vessel.  Minimal diffuse disease.  Gives origin to large OM1 and OM 2. Impression: Nonischemic cardiomyopathy, single vessel occlusion does  not explain her markedly reduced LVEF.  Medical therapy for CAD and nonischemic cardiomyopathy.  I have started her on carvedilol 3.125 mg twice daily.     Medications   Scheduled Meds:  [START ON 12/04/2020] aspirin EC  81 mg Oral Daily   carvedilol  3.125 mg Oral BID WC   Chlorhexidine Gluconate Cloth  6 each Topical Daily   cholecalciferol  1,000 Units Oral Daily   digoxin  0.125 mg Oral Daily   DULoxetine  60 mg Oral Daily   famotidine  10 mg Oral Daily   fenofibrate  160 mg Oral Daily   heparin injection (subcutaneous)  5,000 Units Subcutaneous Q8H   hydrALAZINE  50 mg Oral Q8H   insulin aspart  0-15 Units Subcutaneous TID WC   insulin aspart  10 Units Subcutaneous TID WC   insulin detemir  33 Units Subcutaneous BID   ivabradine  5 mg Oral BID WC   levothyroxine  125 mcg Oral Q0600   multivitamin with minerals  1 tablet  Oral Daily   norethindrone-ethinyl estradiol  1 tablet Oral Daily   pregabalin  300 mg Oral BID   rosuvastatin  40 mg Oral Daily   sacubitril-valsartan  1 tablet Oral BID   sodium chloride flush  10-40 mL Intracatheter Q12H   sodium chloride flush  3 mL Intravenous Q12H   sodium chloride flush  3 mL Intravenous Q12H   sodium chloride flush  3 mL Intravenous Q12H   sodium chloride flush  3 mL Intravenous Q12H   Continuous Infusions:  sodium chloride     sodium chloride Stopped (12/01/20 1212)   sodium chloride         LOS: 10 days    Time spent: 35 minutes     Ezekiel Slocumb, DO [facility]  12/03/2020, 4:28 PM      If 7PM-7AM, please contact night-coverage. How to contact the [facility] Attending or Consulting provider [place] or covering provider during after hours [place], for this patient?    Check the care team in [facility] and look for a) attending/consulting [facility] provider listed and b) the [facility] team listed Log into [email or web address] and use [place]'s universal password to access. If you do not have the password, please contact the hospital operator. Locate the [facility] provider you are looking for under [facility] and page to a number that you can be directly reached. If you still have difficulty reaching the provider, please page the [facility] (Director on Call) for the Hospitalists listed on amion for assistance.

## 2020-12-03 NOTE — Progress Notes (Signed)
Advanced Heart Failure Rounding Note  PCP-Cardiologist: Dr. Terri Skains AHF: Dr. Haroldine Laws    Subjective:    Milrinone stopped yesterday. Co-ox 62%  [place] hypertensive and tachycardic. HR slightly improved with corlanor. Entresto stopped yesterday due to hyperkalemia from over K supplementation. K 4.4 this am. Continues to diurese well.   For R/L cath today  cMRI 12/01/20  IMPRESSION: 1. Midwall late gadolinium enhancement in basal to mid anterior/anterolateral walls. Differential diagnosis includes prior myocarditis (normal T2 values suggests no evidence of acute myocarditis) versus sarcoidosis. Would recommend cardiac PET to evaluate for sarcoid.   2.  Mild LV dilatation with severe systolic dysfunction (EF 75%)   3.  Normal RV size and systolic function (EF 64%)   4. Moderate pericardial effusion measuring up to 1cm adjacent to RV free wall  Objective:   Weight Range: 53.7 kg Body mass index is 25.62 kg/m.   Vital Signs:   Temp:  [98.1 F (36.7 C)-98.3 F (36.8 C)] 98.3 F (36.8 C) (09/07 2021) Pulse Rate:  [130] 130 (09/07 0810) Resp:  [14-31] 17 (09/08 0621) BP: (130-164)/(79-107) 143/83 (09/08 0620) SpO2:  [97 %-98 %] 98 % (09/07 1200) Weight:  [53.7 kg] 53.7 kg (09/08 0100) Last BM Date: 12/02/20  Weight change: Filed Weights   12/01/20 0500 12/02/20 0500 12/03/20 0100  Weight: 54.4 kg 54.4 kg 53.7 kg    Intake/Output:   Intake/Output Summary (Last 24 hours) at 12/03/2020 0746 Last data filed at 12/03/2020 0000 Gross per 24 hour  Intake 244.41 ml  Output 1500 ml  Net -1255.59 ml       Physical Exam   General:  Lying in bed  No resp difficulty HEENT: normal Neck: supple. JVP 7. Carotids 2+ bilat; no bruits. No lymphadenopathy or thryomegaly appreciated. Cor: PMI nondisplaced. Regular tachy No rubs, gallops or murmurs. Lungs: clear Abdomen: soft, nontender, nondistended. No hepatosplenomegaly. No bruits or masses. Good bowel  sounds. Extremities: no cyanosis, clubbing, rash, edema Neuro: alert & orientedx3, cranial nerves grossly intact. moves all 4 extremities w/o difficulty. Affect pleasant   Telemetry   Sinus tach 100-110s Personally reviewed    Labs    CBC Recent Labs    12/02/20 0421 12/03/20 0340  WBC 7.9 8.7  HGB 11.3* 12.0  HCT 35.6* 38.2  MCV 87.7 87.6  PLT 136* 141*    Basic Metabolic Panel Recent Labs    12/02/20 0421 12/02/20 0811 12/02/20 2108 12/02/20 2313 12/03/20 0340  NA 133*   < > 132*  --  133*  K 5.8*   < > 4.8 4.7 4.4  CL 100   < > 97*  --  97*  CO2 26   < > 26  --  25  GLUCOSE 182*   < > 195*  --  102*  BUN 30*   < > 32*  --  33*  CREATININE 1.30*   < > 1.18*  --  1.29*  CALCIUM 9.3   < > 9.6  --  9.8  MG 2.4  --   --   --  2.4  PHOS 4.6  --  6.0*  --  6.3*   < > = values in this interval not displayed.    Liver Function Tests Recent Labs    12/01/20 0303 12/02/20 0421  AST 318* 123*  ALT 1,174* 611*  ALKPHOS 180* 135*  BILITOT 1.4* 1.2  PROT 6.8 6.4*  ALBUMIN 3.4* 3.0*    No results for input(s): LIPASE, AMYLASE in  the last 72 hours.  Cardiac Enzymes No results for input(s): CKTOTAL, CKMB, CKMBINDEX, TROPONINI in the last 72 hours.  BNP: BNP (last 3 results) Recent Labs    11/23/20 0224 11/28/20 1511  BNP 48.9 529.6*     ProBNP (last 3 results) No results for input(s): PROBNP in the last 8760 hours.   D-Dimer No results for input(s): DDIMER in the last 72 hours. Hemoglobin A1C No results for input(s): HGBA1C in the last 72 hours. Fasting Lipid Panel Recent Labs    12/02/20 2108 12/03/20 0340  CHOL 562*  --   HDL 28*  --   LDLCALC UNABLE TO CALCULATE IF TRIGLYCERIDE OVER 400 mg/dL  --   TRIG 873* 719*  CHOLHDL 20.1  --   LDLDIRECT 250.9*  --     Thyroid Function Tests No results for input(s): TSH, T4TOTAL, T3FREE, THYROIDAB in the last 72 hours.  Invalid input(s): FREET3   Other results:   Imaging    No results  found.   Medications:     Scheduled Medications:  Chlorhexidine Gluconate Cloth  6 each Topical Daily   cholecalciferol  1,000 Units Oral Daily   digoxin  0.125 mg Oral Daily   DULoxetine  60 mg Oral Daily   famotidine  10 mg Oral Daily   fenofibrate  160 mg Oral Daily   heparin injection (subcutaneous)  5,000 Units Subcutaneous Q8H   hydrALAZINE  50 mg Oral Q8H   insulin aspart  0-15 Units Subcutaneous TID WC   insulin aspart  10 Units Subcutaneous TID WC   insulin detemir  33 Units Subcutaneous BID   ivabradine  2.5 mg Oral BID WC   levothyroxine  125 mcg Oral Q0600   multivitamin with minerals  1 tablet Oral Daily   norethindrone-ethinyl estradiol  1 tablet Oral Daily   rosuvastatin  40 mg Oral Daily   sodium bicarbonate  650 mg Oral TID   sodium chloride flush  10-40 mL Intracatheter Q12H   sodium chloride flush  3 mL Intravenous Q12H   sodium chloride flush  3 mL Intravenous Q12H   sodium chloride flush  3 mL Intravenous Q12H    Infusions:  sodium chloride     sodium chloride Stopped (12/01/20 1212)   sodium chloride     sodium chloride 10 mL/hr at 12/03/20 0523   sodium chloride     [START ON 12/04/2020] sodium chloride      PRN Medications: sodium chloride, sodium chloride, sodium chloride, diphenhydrAMINE, hydrALAZINE, HYDROmorphone (DILAUDID) injection, lipase/protease/amylase, ondansetron (ZOFRAN) IV, oxyCODONE-acetaminophen, phenol, prochlorperazine, senna-docusate, sodium chloride flush, sodium chloride flush, sodium chloride flush, sodium chloride flush  Assessment/Plan   1. Acute on chronic systolic HF -> cardiogenic shock - likely NICM - Echo 1/22: EF 35-40% Mod MR - Echo 11/28/20 20-25% with moderate RV dysfunction and mild septal flattening. Mod-severe TR - HS troponin 29 argues against acute myocarditis - Rheum serologies pending  - cMRI c/w prior myocarditis vs sarcoid. May have delayed chemo cardiotoxcity  - off milrinone Co-ox 62% - has diuresed  well  - Restart Entresto 49/51 bid. Can back down on hydralazine as BP dictates  - No beta blocker yet due to shock   - Increase Corlanor to 5 bid for rate control  - Start spiro if/when K tolerates - no SGLT2i w/ T1DM  - Await results of cath.  - Suspect she will likely need advanced therapies in the near future. Discussed potential VAD with [facility] transplant team.  2. AKI  - Due to ATN/shock. Improving w/ inotropic support  - Scr stable at 1.3 - follow BMP    3. Shock liver - continue hemodynamic support - CT shows severe hepatic steatosis - LFTs improving - Statin restarted per CCM    4. Severe hyperTG - due to LPL deficiency - improving with insulin gtt but TG trending back up >4000>>789>>1,801> 719 - Continue fenofibrate  - management per CCM   5. Hyponatremia - 133 today  - monitor    6. Type I DM - per TRH  7. Fall - fell out of bed on 11/29/20 - L-S spine films ok   8. Hyperkalemia - improved   Length of Stay: [place], MD  12/03/2020, 7:46 AM  Advanced Heart Failure Team Pager [phone] (M-F; 7a - 5p)  Please contact [facility] for night-coverage after hours (5p -7a ) and weekends on [email or web address]

## 2020-12-04 ENCOUNTER — Other Ambulatory Visit (HOSPITAL_COMMUNITY): Payer: Self-pay

## 2020-12-04 DIAGNOSIS — I10 Essential (primary) hypertension: Secondary | ICD-10-CM | POA: Diagnosis not present

## 2020-12-04 DIAGNOSIS — N179 Acute kidney failure, unspecified: Secondary | ICD-10-CM | POA: Diagnosis not present

## 2020-12-04 DIAGNOSIS — E1059 Type 1 diabetes mellitus with other circulatory complications: Secondary | ICD-10-CM | POA: Diagnosis not present

## 2020-12-04 DIAGNOSIS — I5041 Acute combined systolic (congestive) and diastolic (congestive) heart failure: Secondary | ICD-10-CM | POA: Diagnosis not present

## 2020-12-04 DIAGNOSIS — R109 Unspecified abdominal pain: Secondary | ICD-10-CM | POA: Diagnosis not present

## 2020-12-04 DIAGNOSIS — R570 Cardiogenic shock: Secondary | ICD-10-CM | POA: Diagnosis not present

## 2020-12-04 LAB — LIPID PANEL
Cholesterol: 435 mg/dL — ABNORMAL HIGH (ref 0–200)
Cholesterol: 412 mg/dL — ABNORMAL HIGH (ref 0–200)
HDL: 27 mg/dL — ABNORMAL LOW (ref >40)
HDL: 25 mg/dL — ABNORMAL LOW (ref >40)
LDL Cholesterol: UNDETERMINED mg/dL (ref 0–99)
LDL Cholesterol: UNDETERMINED mg/dL (ref 0–99)
Total CHOL/HDL Ratio: 16.1 RATIO
Total CHOL/HDL Ratio: 16.5 RATIO
Triglycerides: 849 mg/dL — ABNORMAL HIGH (ref <150)
Triglycerides: 670 mg/dL — ABNORMAL HIGH (ref <150)
VLDL: UNDETERMINED mg/dL (ref 0–40)
VLDL: UNDETERMINED mg/dL (ref 0–40)

## 2020-12-04 LAB — COOXEMETRY PANEL
Carboxyhemoglobin: 1.6 % — ABNORMAL HIGH (ref 0.5–1.5)
Methemoglobin: 1.1 % (ref 0.0–1.5)
O2 Saturation: 64.7 %
Total hemoglobin: 12.1 g/dL (ref 12.0–16.0)

## 2020-12-04 LAB — BASIC METABOLIC PANEL
Anion gap: 12 (ref 5–15)
BUN: 36 mg/dL — ABNORMAL HIGH (ref 6–20)
CO2: 25 mmol/L (ref 22–32)
Calcium: 9.5 mg/dL (ref 8.9–10.3)
Chloride: 99 mmol/L (ref 98–111)
Creatinine, Ser: 1.49 mg/dL — ABNORMAL HIGH (ref 0.44–1.00)
GFR, Estimated: 48 mL/min — ABNORMAL LOW (ref >60)
Glucose, Bld: 59 mg/dL — ABNORMAL LOW (ref 70–99)
Potassium: 4.3 mmol/L (ref 3.5–5.1)
Sodium: 136 mmol/L (ref 135–145)

## 2020-12-04 LAB — GLUCOSE, CAPILLARY
Glucose-Capillary: 74 mg/dL (ref 70–99)
Glucose-Capillary: 245 mg/dL — ABNORMAL HIGH (ref 70–99)
Glucose-Capillary: 228 mg/dL — ABNORMAL HIGH (ref 70–99)
Glucose-Capillary: 248 mg/dL — ABNORMAL HIGH (ref 70–99)

## 2020-12-04 LAB — HEPATIC FUNCTION PANEL
ALT: 274 U/L — ABNORMAL HIGH (ref 0–44)
AST: 46 U/L — ABNORMAL HIGH (ref 15–41)
Albumin: 3.4 g/dL — ABNORMAL LOW (ref 3.5–5.0)
Alkaline Phosphatase: 109 U/L (ref 38–126)
Bilirubin, Direct: 0.3 mg/dL — ABNORMAL HIGH (ref 0.0–0.2)
Indirect Bilirubin: 0.5 mg/dL (ref 0.3–0.9)
Total Bilirubin: 0.8 mg/dL (ref 0.3–1.2)
Total Protein: 6.9 g/dL (ref 6.5–8.1)

## 2020-12-04 LAB — ANTI-SCLERODERMA ANTIBODY: Scleroderma (Scl-70) (ENA) Antibody, IgG: <0.2 AI (ref 0.0–0.9)

## 2020-12-04 LAB — FOLATE: Folate: 26.9 ng/mL (ref >5.9)

## 2020-12-04 LAB — PHOSPHORUS: Phosphorus: 5.3 mg/dL — ABNORMAL HIGH (ref 2.5–4.6)

## 2020-12-04 LAB — LDL CHOLESTEROL, DIRECT
Direct LDL: 234.3 mg/dL — ABNORMAL HIGH (ref 0–99)
Direct LDL: 213.1 mg/dL — ABNORMAL HIGH (ref 0–99)

## 2020-12-04 LAB — ANA W/REFLEX IF POSITIVE: Anti Nuclear Antibody (ANA): NEGATIVE

## 2020-12-04 LAB — VITAMIN B12: Vitamin B-12: 1340 pg/mL — ABNORMAL HIGH (ref 180–914)

## 2020-12-04 LAB — MAGNESIUM: Magnesium: 2.2 mg/dL (ref 1.7–2.4)

## 2020-12-04 LAB — TRIGLYCERIDES: Triglycerides: 673 mg/dL — ABNORMAL HIGH (ref <150)

## 2020-12-04 MED ORDER — BOOST / RESOURCE BREEZE PO LIQD CUSTOM
1.0000 | Freq: Two times a day (BID) | ORAL | Status: DC
  Administered 2020-12-04 – 2020-12-06 (×3): 1 via ORAL

## 2020-12-04 MED ORDER — SPIRONOLACTONE 12.5 MG HALF TABLET
12.5000 mg | ORAL_TABLET | Freq: Every day | ORAL | Status: DC
  Administered 2020-12-05 – 2020-12-06 (×2): 12.5 mg via ORAL
  Filled 2020-12-04 (×2): qty 1

## 2020-12-04 NOTE — Progress Notes (Signed)
PROGRESS NOTE    Jennifer [name]   EHU:[id number]  DOB: 12/03/1990  PCP: Sue Lush, PA-C    DOA: 11/22/2020 LOS: 9    Brief Narrative / Hospital Course to Date:   Jennifer [name] is a 30 yo female with PMH astrocytoma, DMII, NAFLD, LPL deficiency, HTN, hypothyroidism, PCOS, sCHF, chronic pancreatitis who presented with progressive worsening abdominal pain x 2 weeks and diarrhea, pt reported similar to prior pancreatitis.  No associated N/V.  Evaluation in the ED showed normal lipase and no signs of pancreatitis on CT abdomen/pelvis.  Due to CT findings distal aorta and bilateral iliac arteries concerning for possible vasculitis, patient was seen by vascular surgery.  Recommended outpatient follow up.  On 8/31, lipid panel showed severe hypertriglyceridemia with TG's 4030.  Hx of lipoprotein lipase deficiency, followed by [facility]ocrinology at [facility] (Dr. Garrel Ridgel).  Treated with insulin/dextrose infusion.  TG's trended down.  Lipase remained normal.    On 9/3 patient developed hypotension and renal function was worsening.  Nephrology and Cardiology were consulted.  Echo showed EF down at 25-30% with elevated LAP and RV volume overload.  Patient was transferred to [facility] and required Levophed and dobutamine in ICU.   follows annually with neurosurgery for her history of astrocytoma as a child which required chemo and may have contributed to her underlying CHF.    follows with endocrinology and cardiology for her diabetes and CHF respectively.   Assessment & Plan   Principal Problem:   Abdominal pain Active Problems:   DM (diabetes mellitus) (HCC)   Hypothyroidism   Chronic systolic CHF (congestive heart failure) (HCC)   Essential hypertension   Hypertriglyceridemia   Prolonged QT interval   AKI (acute kidney injury) ([place])   Hypotension   Acute combined systolic and diastolic heart failure (HCC)   Nonischemic cardiomyopathy (HCC)   Acute decompensated heart failure (HCC)    Elevated liver enzymes   Cardiogenic shock (HCC)   Shock liver   Diarrhea   Acute on Chronic Systolic CHF  >> Cardiogenic shock Non-ischemic cardiomyopathy Echo 1/22: EF 35-40% Mod MR Echo 11/28/20 20-25% with moderate RV dysfunction and mild septal flattening. Mod-severe TR. Cardiac MRI findings consistent with prior myocarditis vs sarcoidosis  L/R Cath 12/03/20 without significant CAD to any degree that would explain low EF, no PCI. --Mgmt per Heart Failure team --Off milrinone and hemodynamically stable.  9/9 - symptomatic orthostatic hypotension (with PT) --s/p diuresis --On Entresto, digoxin, ivabradine --Low dose Coreg started w/shock resolved --Consider Aldactone (K has been up) --Given type 1 DM, no SLGT2 inhibitor --Follow rheum serologgies --Cardiology has discussed with [facility] re potential need for advanced therapies including VAD  Orthostatic hypotension, symptomatic - see PT note of 9/9.  Fall precautions.  Defer medication changes to cardiology.  Consider midodrine.  Maintain MAP>65.   Abdominal pain due to  Hypertriglyceridemia >> Acute on Chronic Pancreatitis - POA.  Treated with insulin/dextrose infusion and TG's down-trending.  Abdominal pain improved. --continue fenofibrate and statin --pt to bring home Repatha or resume on d/c  Transaminitis - due to shock liver.  LFT's improving.  Monitor LFT's  AKI - due to cardiogenic shock. Renal function improving.  Monitor BMP.  Type 1 Diabetes with Hyperglycemia - adjusting insulin for inpatient goal 140-180.    Generalized weakness / Physical Deconditioning - PT and OT evaluations, follow up recommendations.  Hypertension - on cardiac meds above.  Mgmt per cardiology.  Monitor BP.    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   Mild residual Left-sided weakness. ?cardiotoxicity from remote chemo?  Lipoprotein lipase deficiency - follows with endocrinology at [facility].  High-risk for TG-induced  pancreatitis.  Treated as above.   Hypothyroidism - on levothyroxine  Abnormal TSH - TSH low, free T4 normal.  Recheck in outpatient follow up.  Gastroparesis / Delayed gastric emptying - pt had gastric emptying study 08/25/20 showing 53% at 4 hours.    Neuropathy - resume Lyrica  Patient BMI: Body mass index is 25.62 kg/m.   DVT prophylaxis: heparin injection 5,000 Units Start: 11/27/20 1100   Diet:  Diet Orders (From admission, onward)     Start     Ordered   12/04/20 1123  Diet Carb Modified Fluid consistency: Thin; Room service appropriate? Yes; Fluid restriction: 1500 mL Fluid  Diet effective now       Question Answer Comment  Diet-HS Snack? Nothing   Calorie Level Medium 1600-2000   Fluid consistency: Thin   Room service appropriate? Yes   Fluid restriction: 1500 mL Fluid      12/04/20 1131              Code Status: Full Code   Subjective 12/04/20    Pt sleeping very soundly today when seen.  She would wake briefly and respond appropriately but quickly falls back to sleep.  Denies SOB or CP.  No acute events reported.   Notified by PT later today that patient has significant orthostatic hypotension.   Disposition Plan & Communication   Status is: Inpatient  Remains inpatient appropriate because:Inpatient level of care appropriate due to severity of illness.  PT/OT evaluations pending.  D/C pending cardiology clearance, medication regimen being adjusted.  Dispo: The patient is from: Home              Anticipated d/c is to: Home              Patient currently is not medically stable to d/c.   Difficult to place patient No   Family Communication: husband at bedside on rounds 9/8   Consults, Procedures, Significant Events   Consultants:  Cardiology / Heart Failure Nephrology PCCM Gastroenterology   Procedures:  L & R Heart Cath 12/03/20 - see report   Antimicrobials:  Anti-infectives (From admission, onward)    None         Micro     Objective   Vitals:   12/04/20 0923 12/04/20 1156 12/04/20 1606 12/04/20 1751  BP: 95/66 108/69 (!) 93/59 (!) 106/98  Pulse:  91 93   Resp:  16 16   Temp:  98.2 F (36.8 C) 98.2 F (36.8 C)   TempSrc:  Oral Oral   SpO2:  94% 96%   Weight:      Height:        Intake/Output Summary (Last 24 hours) at 12/04/2020 1802 Last data filed at 12/04/2020 0900 Gross per 24 hour  Intake 440 ml  Output --  Net 440 ml   Filed Weights   12/01/20 0500 12/02/20 0500 12/03/20 0100  Weight: 54.4 kg 54.4 kg 53.7 kg    Physical Exam:  General exam: sleeping, snoring, wakes briefly but falls back to sleep, no acute distress Respiratory system: CTAB, no wheezes, rales or rhonchi, normal respiratory effort. Cardiovascular system: normal S1/S2, RRR   Gastrointestinal system: soft, NT, ND. Skin: dry, intact, normal temperature   Labs   Data Reviewed: I have personally reviewed following labs and imaging studies  CBC: Recent  Labs  Lab 11/28/20 0218 11/28/20 2256 11/29/20 0101 11/30/20 0211 12/01/20 0303 12/02/20 0421 12/03/20 0340 12/03/20 0819  WBC 9.8 11.5* 11.0* 8.4 7.8 7.9 8.7  --   NEUTROABS 6.7 9.4* 8.8*  --   --   --   --   --   HGB 10.2* 9.3* 9.6* 10.2* 11.4* 11.3* 12.0 12.2  11.9*  HCT 32.2* 28.7* 29.6* 30.4* 34.4* 35.6* 38.2 36.0  35.0*  MCV 89.0 87.0 87.3 87.6 86.4 87.7 87.6  --   PLT 119* 91* 95* 123* 134* 136* 141*  --    Basic Metabolic Panel: Recent Labs  Lab 11/30/20 0211 12/01/20 0303 12/02/20 0421 12/02/20 4801 12/02/20 1744 12/02/20 2108 12/02/20 2313 12/03/20 0340 12/03/20 0819 12/03/20 1033 12/03/20 1541 12/04/20 0505  NA 134* 135 133* 131* 132* 132*  --  133* 136  136  --   --  136  K 3.8 3.6 5.8* 5.7* 6.3* 4.8   < > 4.4 4.6  4.5 4.7 4.4 4.3  CL 100 99 100 97* 98 97*  --  97*  --   --   --  99  CO2 17* 27 26 25 23 26   --  25  --   --   --  25  GLUCOSE 228* 75 182* 198* 201* 195*  --  102*  --   --   --  59*  BUN 47* 33* 30* 28* 30* 32*  --   33*  --   --   --  36*  CREATININE 2.12* 1.36* 1.30* 1.19* 1.27* 1.18*  --  1.29*  --   --   --  1.49*  CALCIUM 7.9* 8.5* 9.3 9.3 9.9 9.6  --  9.8  --   --   --  9.5  MG 2.0 1.8 2.4  --   --   --   --  2.4  --   --   --  2.2  PHOS 2.9 4.0 4.6  --   --  6.0*  --  6.3*  --   --   --  5.3*   < > = values in this interval not displayed.   GFR: Estimated Creatinine Clearance: 39.2 mL/min (A) (by C-G formula based on SCr of 1.49 mg/dL (H)). Liver Function Tests: Recent Labs  Lab 11/29/20 0614 11/30/20 0500 12/01/20 0303 12/02/20 0421 12/04/20 0819  AST 1,252* 501* 318* 123* 46*  ALT 1,671* 1,251* 1,174* 611* 274*  ALKPHOS 124 139* 180* 135* 109  BILITOT 2.6* 1.6* 1.4* 1.2 0.8  PROT 6.0* RESULTS UNAVAILABLE DUE TO INTERFERING SUBSTANCE 6.8 6.4* 6.9  ALBUMIN 2.9* 3.2* 3.4* 3.0* 3.4*   No results for input(s): LIPASE, AMYLASE in the last 168 hours.  Recent Labs  Lab 11/28/20 1511  AMMONIA 67*   Coagulation Profile: Recent Labs  Lab 11/28/20 1817  INR 1.5*   Cardiac Enzymes: No results for input(s): CKTOTAL, CKMB, CKMBINDEX, TROPONINI in the last 168 hours. BNP (last 3 results) No results for input(s): PROBNP in the last 8760 hours. HbA1C: No results for input(s): HGBA1C in the last 72 hours. CBG: Recent Labs  Lab 12/03/20 1609 12/03/20 2117 12/04/20 0850 12/04/20 1159 12/04/20 1603  GLUCAP 166* 141* 74 245* 228*   Lipid Profile: Recent Labs    12/03/20 2141 12/04/20 0505 12/04/20 0925  CHOL 435*  --  412*  HDL 27*  --  25*  LDLCALC UNABLE TO CALCULATE IF TRIGLYCERIDE OVER 400 mg/dL  --  UNABLE TO CALCULATE IF  TRIGLYCERIDE OVER 400 mg/dL  TRIG 849* 673* 670*  CHOLHDL 16.1  --  16.5  LDLDIRECT 234.3*  --  213.1*   Thyroid Function Tests: No results for input(s): TSH, T4TOTAL, FREET4, T3FREE, THYROIDAB in the last 72 hours. Anemia Panel: No results for input(s): VITAMINB12, FOLATE, FERRITIN, TIBC, IRON, RETICCTPCT in the last 72 hours. Sepsis Labs: Recent  Labs  Lab 11/28/20 1511  LATICACIDVEN 1.3    Recent Results (from the past 240 hour(s))  MRSA Next Gen by PCR, Nasal     Status: None   Collection Time: 11/27/20 10:07 PM   Specimen: Nasal Mucosa; Nasal Swab  Result Value Ref Range Status   MRSA by PCR Next Gen NOT DETECTED NOT DETECTED Final    Comment: (NOTE) The GeneXpert MRSA Assay (FDA approved for NASAL specimens only), is one component of a comprehensive MRSA colonization surveillance program. It is not intended to diagnose MRSA infection nor to guide or monitor treatment for MRSA infections. Test performance is not FDA approved in patients less than 41 years old. Performed at [facility], [place] [address], [place], [place] [address]       Imaging Studies   CARDIAC CATHETERIZATION  Result Date: 12/03/2020 Right and left heart catheterization 12/03/2020: Normal right heart catheterization pressures.  RA 4/1, mean 1; RV 23/4, EDP 6; PA 22/8, mean 11 and PA saturation 76%.  PW 9/7, mean 7 mmHg. CO 5.6, CI 3.88.  QP/QS 1.00. LV: 136/9, EDP 17 mmHg.  Ao 131/86, mean 105 mmHg.  No pressure gradient across the aortic valve. RCA: A very small but dominant RCA which is diffusely diseased around 80%.  Mid segment 99% occlusion.  Conus branch collateralizes distal RCA. LM: Large, smooth and normal. LAD: Large vessel, minimal diffuse disease.  Small to moderate-sized D1 and D2. CX: Large vessel.  Minimal diffuse disease.  Gives origin to large OM1 and OM 2. Impression: Nonischemic cardiomyopathy, single vessel occlusion does not explain her markedly reduced LVEF.  Medical therapy for CAD and nonischemic cardiomyopathy.  I have started her on carvedilol 3.125 mg twice daily.     Medications   Scheduled Meds:  aspirin EC  81 mg Oral Daily   carvedilol  3.125 mg Oral BID WC   Chlorhexidine Gluconate Cloth  6 each Topical Daily   cholecalciferol  1,000 Units Oral Daily   digoxin  0.125 mg Oral Daily   DULoxetine  60 mg  Oral Daily   famotidine  10 mg Oral Daily   feeding supplement  1 Container Oral BID BM   fenofibrate  160 mg Oral Daily   heparin injection (subcutaneous)  5,000 Units Subcutaneous Q8H   insulin aspart  0-15 Units Subcutaneous TID WC   insulin aspart  10 Units Subcutaneous TID WC   insulin detemir  33 Units Subcutaneous BID   ivabradine  5 mg Oral BID WC   levothyroxine  125 mcg Oral Q0600   multivitamin with minerals  1 tablet Oral Daily   norethindrone-ethinyl estradiol  1 tablet Oral Daily   pregabalin  300 mg Oral BID   rosuvastatin  40 mg Oral Daily   sacubitril-valsartan  1 tablet Oral BID   sodium chloride flush  10-40 mL Intracatheter Q12H   sodium chloride flush  3 mL Intravenous Q12H   sodium chloride flush  3 mL Intravenous Q12H   sodium chloride flush  3 mL Intravenous Q12H   sodium chloride flush  3 mL Intravenous Q12H   [START ON  12/05/2020] spironolactone  12.5 mg Oral Daily   Continuous Infusions:  sodium chloride     sodium chloride Stopped (12/01/20 1212)   sodium chloride         LOS: 11 days    Time spent: 25 minutes with > 50% spent at bedside and in coordination of care     Ezekiel Slocumb, DO [facility]  12/04/2020, 6:02 PM      If 7PM-7AM, please contact night-coverage. How to contact the [facility] Attending or Consulting provider [place] or covering provider during after hours [place], for this patient?    Check the care team in [facility] and look for a) attending/consulting [facility] provider listed and b) the [facility] team listed Log into [email or web address] and use [place]'s universal password to access. If you do not have the password, please contact the hospital operator. Locate the [facility] provider you are looking for under [facility] and page to a number that you can be directly reached. If you still have difficulty reaching the provider, please page the [facility] (Director on Call) for the Hospitalists listed on amion for assistance.

## 2020-12-04 NOTE — Progress Notes (Signed)
Progress Note  Patient Name: Jennifer [name] Date of Encounter: 12/04/2020  Attending physician: Ezekiel Slocumb, DO Primary care provider: Sue Lush, PA-C Primary Cardiologist: Dr. Einar Gip Consultant:Otho Michalik Terri Skains, DO  Subjective: Jennifer [name] is a 30 y.o. female who was seen and examined at bedside  Denies chest pain or shortness of breath. Transferred to medical floors yesterday from ICU. Slight uptrend in serum creatinine, LFTs improving. Co-ox 64.7 No family at bedside.   Objective: Vital Signs in the last 24 hours: Temp:  [97.9 F (36.6 C)-98.4 F (36.9 C)] 98.2 F (36.8 C) (09/09 1606) Pulse Rate:  [80-112] 93 (09/09 1606) Resp:  [16-20] 16 (09/09 1606) BP: (87-130)/(57-81) 93/59 (09/09 1606) SpO2:  [94 %-100 %] 96 % (09/09 1606)  Intake/Output:  Intake/Output Summary (Last 24 hours) at 12/04/2020 1622 Last data filed at 12/04/2020 0900 Gross per 24 hour  Intake 440 ml  Output --  Net 440 ml    Net IO Since Admission: -1,826.2 mL [12/04/20 1622]  Weights:  Filed Weights   12/01/20 0500 12/02/20 0500 12/03/20 0100  Weight: 54.4 kg 54.4 kg 53.7 kg    Telemetry: Personally reviewed - sinus   Physical examination: PHYSICAL EXAM: Vitals with BMI 12/04/2020 12/04/2020 12/04/2020  Height - - -  Weight - - -  BMI - - -  Systolic 93 921 95  Diastolic 59 69 66  Pulse 93 91 -   CONSTITUTIONAL: Appears older than stated age, well-developed and well-nourished. No acute distress.  Hemodynamically stable. SKIN: Skin is warm and dry. No rash noted. No cyanosis. No pallor. No jaundice HEAD: Normocephalic and atraumatic.  EYES: No scleral icterus MOUTH/THROAT: Moist oral membranes.  NECK: No JVP. No thyromegaly noted. No carotid bruits  LYMPHATIC: No visible cervical adenopathy.  CHEST Normal respiratory effort. No intercostal retractions  LUNGS: CTAB, no rales or stridor. No wheezes.  CARDIOVASCULAR: Regular, positive J9-E1, soft systolic murmur heard over the  left lower sternal border, no gallops or rubs. ABDOMINAL: Soft, nondistended, nontender, decreased bowel sounds in all 4 quadrants, EXTREMITIES: Right lower extremity more swollen compared to left (chronic per patient), +trace edema bilaterally, Warm to touch.  HEMATOLOGIC: No significant bruising NEUROLOGIC: Oriented to person, place, and time.  Right proptosis.  Left-sided weakness  PSYCHIATRIC: Normal mood and affect. Normal behavior. Cooperative  Lab Results: Hematology Recent Labs  Lab 12/01/20 0303 12/02/20 0421 12/03/20 0340 12/03/20 0819  WBC 7.8 7.9 8.7  --   RBC 3.98 4.06 4.36  --   HGB 11.4* 11.3* 12.0 12.2  11.9*  HCT 34.4* 35.6* 38.2 36.0  35.0*  MCV 86.4 87.7 87.6  --   MCH 28.6 27.8 27.5  --   MCHC 33.1 31.7 31.4  --   RDW 16.4* 16.1* 15.7*  --   PLT 134* 136* 141*  --     Chemistry Recent Labs  Lab 12/01/20 0303 12/02/20 0421 12/02/20 0811 12/02/20 2108 12/02/20 2313 12/03/20 0340 12/03/20 0819 12/03/20 1033 12/03/20 1541 12/04/20 0505 12/04/20 0819  NA 135 133*   < > 132*  --  133* 136  136  --   --  136  --   K 3.6 5.8*   < > 4.8   < > 4.4 4.6  4.5 4.7 4.4 4.3  --   CL 99 100   < > 97*  --  97*  --   --   --  99  --   CO2 27 26   < > 26  --  25  --   --   --  25  --   GLUCOSE 75 182*   < > 195*  --  102*  --   --   --  59*  --   BUN 33* 30*   < > 32*  --  33*  --   --   --  36*  --   CREATININE 1.36* 1.30*   < > 1.18*  --  1.29*  --   --   --  1.49*  --   CALCIUM 8.5* 9.3   < > 9.6  --  9.8  --   --   --  9.5  --   PROT 6.8 6.4*  --   --   --   --   --   --   --   --  6.9  ALBUMIN 3.4* 3.0*  --   --   --   --   --   --   --   --  3.4*  AST 318* 123*  --   --   --   --   --   --   --   --  46*  ALT 1,174* 611*  --   --   --   --   --   --   --   --  274*  ALKPHOS 180* 135*  --   --   --   --   --   --   --   --  109  BILITOT 1.4* 1.2  --   --   --   --   --   --   --   --  0.8  GFRNONAA 54* 57*   < > >60  --  58*  --   --   --  48*  --    ANIONGAP 9 7   < > 9  --  11  --   --   --  12  --    < > = values in this interval not displayed.     Cardiac Enzymes: Cardiac Panel (last 3 results) No results for input(s): CKTOTAL, CKMB, TROPONINIHS, RELINDX in the last 72 hours.   BNP (last 3 results) Recent Labs    11/23/20 0224 11/28/20 1511  BNP 48.9 529.6*    ProBNP (last 3 results) No results for input(s): PROBNP in the last 8760 hours.   DDimer No results for input(s): DDIMER in the last 168 hours.   Hemoglobin A1c:  Lab Results  Component Value Date   HGBA1C 7.8 (H) 11/23/2020   MPG 177.16 11/23/2020    TSH  Recent Labs    11/28/20 1511  TSH 0.098*    Lipid Panel     Component Value Date/Time   CHOL 412 (H) 12/04/2020 0925   TRIG 670 (H) 12/04/2020 0925   HDL 25 (L) 12/04/2020 0925   CHOLHDL 16.5 12/04/2020 0925   VLDL UNABLE TO CALCULATE IF TRIGLYCERIDE OVER 400 mg/dL 12/04/2020 0925   LDLCALC UNABLE TO CALCULATE IF TRIGLYCERIDE OVER 400 mg/dL 12/04/2020 0925   LDLDIRECT 213.1 (H) 12/04/2020 0925    Imaging: CARDIAC CATHETERIZATION  Result Date: 12/03/2020 Right and left heart catheterization 12/03/2020: Normal right heart catheterization pressures.  RA 4/1, mean 1; RV 23/4, EDP 6; PA 22/8, mean 11 and PA saturation 76%.  PW 9/7, mean 7 mmHg. CO 5.6, CI 3.88.  QP/QS 1.00. LV: 136/9, EDP 17 mmHg.  Ao 131/86, mean 105 mmHg.  No pressure gradient across the aortic valve. RCA: A very small but dominant RCA which is diffusely diseased around 80%.  Mid segment 99% occlusion.  Conus branch collateralizes distal RCA. LM: Large, smooth and normal. LAD: Large vessel, minimal diffuse disease.  Small to moderate-sized D1 and D2. CX: Large vessel.  Minimal diffuse disease.  Gives origin to large OM1 and OM 2. Impression: Nonischemic cardiomyopathy, single vessel occlusion does not explain her markedly reduced LVEF.  Medical therapy for CAD and nonischemic cardiomyopathy.  I have started her on carvedilol 3.125 mg  twice daily.    Cardiac database: EKG: 11/14/2020: Sinus tachycardia, 107 bpm, poor R wave progression, without underlying injury pattern.Compared to prior ECG 10/07/2020 ventricular rate has improved.   Echocardiogram: 11/28/2020:  1. Left ventricular ejection fraction, by estimation, is 25 to 30%. The left ventricle has severely decreased function. The left ventricle demonstrates global hypokinesis. Left ventricular diastolic parameters are  consistent with Grade II diastolic dysfunction (pseudonormalization). Elevated left atrial pressure. There is the interventricular septum is flattened in diastole ('D' shaped left ventricle), consistent with right ventricular volume overload.   2. Right ventricular systolic function is mildly reduced. The right ventricular size is normal. There is mildly elevated pulmonary artery systolic pressure.   3. The mitral valve is normal in structure. Mild to moderate mitral valve regurgitation.   4. Tricuspid valve regurgitation is moderate to severe.   5. The aortic valve is normal in structure. Aortic valve regurgitation is  mild.    Stress Testing:  Lexiscan (Walking with mod Bruce) Sestamibi Stress Test 05/08/2019: Nondiagnostic ECG stress. Myocardial perfusion is normal. LV is normal in size both in rest and stress images. Stress LV EF:  calculated at 38% but visually normal. No previous exam available for comparison. Low risk study.   Right and left heart catheterization 12/03/2020: Normal right heart catheterization pressures.  RA 4/1, mean 1; RV 23/4, EDP 6; PA 22/8, mean 11 and PA saturation 76%.  PW 9/7, mean 7 mmHg. CO 5.6, CI 3.88.  QP/QS 1.00. LV: 136/9, EDP 17 mmHg.  Ao 131/86, mean 105 mmHg.  No pressure gradient across the aortic valve. RCA: A very small but dominant RCA which is diffusely diseased around 80%.  Mid segment 99% occlusion.  Conus branch collateralizes distal RCA. LM: Large, smooth and normal. LAD: Large vessel, minimal diffuse  disease.  Small to moderate-sized D1 and D2. CX: Large vessel.  Minimal diffuse disease.  Gives origin to large OM1 and OM 2.   Impression: Nonischemic cardiomyopathy, single vessel occlusion does not explain her markedly reduced LVEF.  Medical therapy for CAD and nonischemic cardiomyopathy.  I have started her on carvedilol 3.125 mg twice daily.  Scheduled Meds:  aspirin EC  81 mg Oral Daily   carvedilol  3.125 mg Oral BID WC   Chlorhexidine Gluconate Cloth  6 each Topical Daily   cholecalciferol  1,000 Units Oral Daily   digoxin  0.125 mg Oral Daily   DULoxetine  60 mg Oral Daily   famotidine  10 mg Oral Daily   feeding supplement  1 Container Oral BID BM   fenofibrate  160 mg Oral Daily   heparin injection (subcutaneous)  5,000 Units Subcutaneous Q8H   insulin aspart  0-15 Units Subcutaneous TID WC   insulin aspart  10 Units Subcutaneous TID WC   insulin detemir  33 Units Subcutaneous BID   ivabradine  5 mg Oral BID WC   levothyroxine  125 mcg Oral Q0600  multivitamin with minerals  1 tablet Oral Daily   norethindrone-ethinyl estradiol  1 tablet Oral Daily   pregabalin  300 mg Oral BID   rosuvastatin  40 mg Oral Daily   sacubitril-valsartan  1 tablet Oral BID   sodium chloride flush  10-40 mL Intracatheter Q12H   sodium chloride flush  3 mL Intravenous Q12H   sodium chloride flush  3 mL Intravenous Q12H   sodium chloride flush  3 mL Intravenous Q12H   sodium chloride flush  3 mL Intravenous Q12H   [START ON 12/05/2020] spironolactone  12.5 mg Oral Daily    Continuous Infusions:  sodium chloride     sodium chloride Stopped (12/01/20 1212)   sodium chloride      PRN Meds: sodium chloride, sodium chloride, acetaminophen, diphenhydrAMINE, HYDROmorphone (DILAUDID) injection, ondansetron (ZOFRAN) IV, oxyCODONE-acetaminophen, phenol, prochlorperazine, senna-docusate, sodium chloride flush, sodium chloride flush, sodium chloride flush   IMPRESSION & RECOMMENDATIONS: Jennifer [name] is a 30 y.o. 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  Single-vessel CAD without angina pectoris: Recent left heart catheterization noted nondominant RCA with mid vessel stenosis.   Recommend guideline directed medical therapy and improving modifiable cardiovascular risk factors.  Acute on chronic systolic heart failure stage C, NYHA class II: Most likely secondary to nonischemic cardiomyopathy  Echocardiogram 03/2020: LVEF 35-40% with moderate MR. Echocardiogram 11/28/2020: LVEF 25-30%, RV dysfunction, moderate to severe TR. Cardiac MRI results reviewed.  Off of inotropic support and Co-ox is 64.7% Slowly uptitrating GDMT as hemodynamics and laboratory values allow. Would recommend holding Aldactone for today given the soft blood pressures. Advanced heart failure recommendations reviewed and greatly appreciated.  Nonischemic cardiomyopathy: See above  Acute kidney injury: Improving.  Peak creatinine 3.91 mg/dL. Most likely secondary to ischemic ATN, polypharmacy, recent contrast exposure, cardiorenal physiology. Renal function improved after the initiation of inotropic support. Monitor BUN and Cr.   Shock liver secondary to acute/chronic heart failure with reduced EF: AST and ALT improving Monitor for now.  Hypertriglyceridemia with lipoprotein lipase deficiency: TG on arrival 4040 Continue fenofibrate.  Management per primary team.   Hypothyroidism: Currently on Synthroid.  Currently managed by primary team.  Type 1 diabetes mellitus with hypertriglyceridemia and circulatory complication: Per primary team.  Patient's questions and concerns were addressed to her  satisfaction. She voices understanding of the instructions provided during this encounter.   This note was created using a voice recognition software as a result there may be grammatical errors inadvertently enclosed that do not reflect the nature of this encounter. Every attempt is made to correct such errors.  Total time spent : 25 minutes.   Rex Kras, [place], [facility]  Pager: [phone] Office: [phone]

## 2020-12-04 NOTE — Progress Notes (Signed)
Nutrition Follow-up  DOCUMENTATION CODES:   Not applicable  INTERVENTION:   Add Boost Breeze po BID, each supplement provides 250 kcal and 9 grams of protein. Remove renal restriction from diet order, change to carbohydrate modified with 1500 ml fluid restriction. Allow low fat yogurt with all meals. Check levels of vitamins A, C, E, K, B-12, thiamine, copper, folate, zinc.   NUTRITION DIAGNOSIS:   Increased nutrient needs related to acute illness, chronic illness as evidenced by estimated needs.  Ongoing   GOAL:   Patient will meet greater than or equal to 90% of their needs  Progressing   MONITOR:   PO intake, Labs, Weight trends  REASON FOR ASSESSMENT:   Consult Diet education, Assessment of nutrition requirement/status  ASSESSMENT:   30 yo admitted with acute on chronic CHF with cardiogenic shock, shock liver, AKI, severe hyperTG with hx of LPL deficiency. PMH includes type 1 DM, chronic pancreatitis, astrocytoma s/p surgery with L-sided deficits, NAFLD  Patient reports that she has not been eating well since admission. Today was the first day she ate much, and she had some abdominal discomfort afterwards. She would benefit from a low fat PO supplement to maximize protein and calorie intake. Discussed with patient her diet recommendations, which she is very familiar with. We discussed CHO counting, low fat intake, fluid restriction.    Patient likely has some vitamin/mineral deficiencies associated with low fat intake for LPL deficiency. Recommend checking vitamin A, C, E, K, B-12, thiamine, copper, folate, and zinc.  Labs reviewed. Phos 5.3 CBG: 74 this AM  Medications reviewed and include cholecalciferol, Pepcid, Novolog, Levemir, MVI with minerals, spironolactone.  Admission weight 55.8 kg Current weight 53.7 kg Weight decreasing with diuresis.   Diet Order:   Diet Order             Diet Carb Modified Fluid consistency: Thin; Room service appropriate?  Yes; Fluid restriction: 1500 mL Fluid  Diet effective now                   EDUCATION NEEDS:   Education needs have been addressed  Skin:  Skin Assessment: Reviewed RN Assessment  Last BM:  9/7 type 4  Height:   Ht Readings from Last 1 Encounters:  11/27/20 4' 9"  (1.448 m)    Weight:   Wt Readings from Last 1 Encounters:  12/03/20 53.7 kg    BMI:  Body mass index is 25.62 kg/m.  Estimated Nutritional Needs:   Kcal:  1600-1900 kcals  Protein:  80-95 g  Fluid:  >/= 1.5 L    Lucas Mallow, RD, LDN, CNSC Please refer to Amion for contact information.

## 2020-12-04 NOTE — Progress Notes (Signed)
Physical Therapy Treatment Patient Details Name: Jennifer [name] MRN: [id number] DOB: July 09, 1990 Today's Date: 12/04/2020    History of Present Illness Pt is a 30 y.o. female admitted 11/22/20 with abrominal pain, diarrhea; pt found to have hypotension, elevated creatinine; workup for cardiogenic shock, AKI. ECHO showed LVEF 25-30%, moderate to severe TR. cMRI consistent with prior myocarditis vs sarcoid; moderate pericardial effusion. Of note, pt with fall out of bed on 9/4. S/p R/L cath 9/8. 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    PT Comments    Focused session on improving balance with gait without UE support as pt desires not to return home with an AD. She continues to display intermittent bouts of staggering, resulting in her needing up to minA to recover. However, she reports her shoes and L leg orthotic tend to help her gait/balance and reports she will try to get her husband to bring them to the hospital. Pt was able to negotiate x8 stairs while utilizing the R handrail with min guard assist ascending and minA descending today. Pt limited in mobility by lightheadedness with position changes and exertion of stairs, see below, DO and RN made aware. Will continue to follow acutely. Current recommendations remain appropriate.  BP: 96/64 supine 92/57 sitting (lightheadedness noted) 73/51 standing (continued lightheadedness similar to in sitting per pt) 93/60 standing after ~3 minutes (improved symptoms) 80/60 following gait/stairs (increased lightheadedness on stairs) 106/73 with return to supine (resolution of symptoms)     Follow Up Recommendations  Outpatient PT;Supervision for mobility/OOB     Equipment Recommendations  None recommended by PT (pt declines desire for AD/AE)    Recommendations for Other Services       Precautions / Restrictions Precautions Precautions: Fall;Other  (comment) Precaution Comments: Watch BP; h/o residual L-side deficits Restrictions Weight Bearing Restrictions: No    Mobility  Bed Mobility Overal bed mobility: Modified Independent Bed Mobility: Supine to Sit;Sit to Supine     Supine to sit: Modified independent (Device/Increase time);HOB elevated Sit to supine: Modified independent (Device/Increase time);HOB elevated   General bed mobility comments: Mod I and extra time with use of rails and HOB elevated to perform bed mobility.    Transfers Overall transfer level: Needs assistance Equipment used: None Transfers: Sit to/from Stand Sit to Stand: Min guard         General transfer comment: Min guard assist for safety due to noted trunk sway, but no LOB, and pt reporting some lightheadedness.  Ambulation/Gait Ambulation/Gait assistance: Min guard;Min assist Gait Distance (Feet): 400 Feet Assistive device: None Gait Pattern/deviations: Step-through pattern;Decreased stride length;Staggering right;Staggering left;Decreased dorsiflexion - left Gait velocity: Decreased Gait velocity interpretation: <1.8 ft/sec, indicate of risk for recurrent falls General Gait Details: Slow, unsteady gait with pt displaying L foot drag (baseline per pt). Intermittent lateral staggering resulting in pt taking reactional cross step to try to regain balance, needing up to minA intermittently to prevent LOB. Educated pt on benefits of AD to improve safety but pt reports desire not get one going home.   Stairs Stairs: Yes Stairs assistance: Min assist;Min guard Stair Management: One rail Right;Alternating pattern;Forwards Number of Stairs: 8 General stair comments: Ascends and descends with R rail and reciprocal pattern. Min guard ascending for safety as pt became lightheaded as she approached the 7/8th step, cued pt to stop and breathe before descending. MinA to provide stability descending.   Wheelchair Mobility    Modified Rankin (Stroke  Patients Only)  Balance Overall balance assessment: Needs assistance Sitting-balance support: No upper extremity supported;Feet supported Sitting balance-Leahy Scale: Good     Standing balance support: No upper extremity supported;During functional activity Standing balance-Leahy Scale: Fair Standing balance comment: can static stand and ambulate without UE support, needing minA intermittently to prevent LOB; static and dynamic stability improved with UE support                            Cognition Arousal/Alertness: Awake/alert Behavior During Therapy: WFL for tasks assessed/performed;Flat affect Overall Cognitive Status: Within Functional Limits for tasks assessed                                        Exercises      General Comments General comments (skin integrity, edema, etc.): HR 90-110s; BP 96/64 supine; 92/57 sitting (lightheadedness noted); 73/51 standing (continued lightheadedness similar to in sitting per pt); 93/60 standing after ~3 minutes (improved symptoms); 80/60 following gait/stairs (increased lightheadedness on stairs); 106/73 with return to supine (resolution of symptoms)      Pertinent Vitals/Pain Pain Assessment: Faces Faces Pain Scale: No hurt Pain Intervention(s): Monitored during session    Home Living                      Prior Function            PT Goals (current goals can now be found in the care plan section) Acute Rehab PT Goals Patient Stated Goal: to not have a cane/RW PT Goal Formulation: With patient Time For Goal Achievement: 12/11/20 Potential to Achieve Goals: Good Progress towards PT goals: Progressing toward goals    Frequency    Min 3X/week      PT Plan Current plan remains appropriate    Co-evaluation              AM-PAC PT "6 Clicks" Mobility   Outcome Measure  Help needed turning from your back to your side while in a flat bed without using bedrails?: None Help  needed moving from lying on your back to sitting on the side of a flat bed without using bedrails?: None Help needed moving to and from a bed to a chair (including a wheelchair)?: A Little Help needed standing up from a chair using your arms (e.g., wheelchair or bedside chair)?: A Little Help needed to walk in hospital room?: A Little Help needed climbing 3-5 steps with a railing? : A Little 6 Click Score: 20    End of Session Equipment Utilized During Treatment: Gait belt Activity Tolerance: Patient tolerated treatment well;Other (comment) (limited by lightheadedness/BP) Patient left: in bed;with call bell/phone within reach;with bed alarm set Nurse Communication: Mobility status;Other (comment) (BP) PT Visit Diagnosis: Difficulty in walking, not elsewhere classified (R26.2);Unsteadiness on feet (R26.81);Other abnormalities of gait and mobility (R26.89);Dizziness and giddiness (R42)     Time: 2336-1224 PT Time Calculation (min) (ACUTE ONLY): 18 min  Charges:  $Gait Training: 8-22 mins                     Moishe Spice, PT, DPT Acute Rehabilitation Services  Pager: [phone] Office: [place] 12/04/2020, 5:15 PM

## 2020-12-04 NOTE — Progress Notes (Addendum)
Advanced Heart Failure Rounding Note  PCP-Cardiologist: Dr. Terri Skains AHF: Dr. Haroldine Laws    Subjective:    Jennifer stopped 09/07. Co-ox 62% yesterday. No co-ox so far this am.  Jennifer [name] added back yesterday and coreg started. BP improved.   Scr  up slightly 1.29 > 1.49 and K 4.3.  Weight stable.   No dyspnea. Denies orthopnea or PND. No CP.   R/LHC 12/03/20 Normal right heart catheterization pressures.  RA 4/1, mean 1; RV 23/4, EDP 6; PA 22/8, mean 11 and PA saturation 76%.  PW 9/7, mean 7 mmHg. CO 5.6, CI 3.88.  QP/QS 1.00. LV: 136/9, EDP 17 mmHg.  Ao 131/86, mean 105 mmHg.  No pressure gradient across the aortic valve. RCA: A very small but dominant RCA which is diffusely diseased around 80%.  Mid segment 99% occlusion.  Conus branch collateralizes distal RCA. LM: Large, smooth and normal. LAD: Large vessel, minimal diffuse disease.  Small to moderate-sized D1 and D2. CX: Large vessel.  Minimal diffuse disease.  Gives origin to large OM1 and OM 2.  Impression: Nonischemic cardiomyopathy, single vessel occlusion does not explain her markedly reduced LVEF.  Medical therapy for CAD and nonischemic cardiomyopathy.   cMRI 12/01/20  IMPRESSION: 1. Midwall late gadolinium enhancement in basal to mid anterior/anterolateral walls. Differential diagnosis includes prior myocarditis (normal T2 values suggests no evidence of acute myocarditis) versus sarcoidosis. Would recommend cardiac PET to evaluate for sarcoid.   2.  Mild LV dilatation with severe systolic dysfunction (EF 69%)   3.  Normal RV size and systolic function (EF 48%)   4. Moderate pericardial effusion measuring up to 1cm adjacent to RV free wall  Objective:   Weight Range: 53.7 kg Body mass index is 25.62 kg/m.   Vital Signs:   Temp:  [97.9 F (36.6 C)-98.4 F (36.9 C)] 97.9 F (36.6 C) (09/09 0518) Pulse Rate:  [0-116] 93 (09/09 0009) Resp:  [0-21] 20 (09/08 2118) BP: (105-148)/(31-95) 108/67 (09/09  0519) SpO2:  [0 %-100 %] 100 % (09/09 0518) Last BM Date: 12/02/20  Weight change: Filed Weights   12/01/20 0500 12/02/20 0500 12/03/20 0100  Weight: 54.4 kg 54.4 kg 53.7 kg    Intake/Output:   Intake/Output Summary (Last 24 hours) at 12/04/2020 0734 Last data filed at 12/03/2020 1300 Gross per 24 hour  Intake 262.07 ml  Output --  Net 262.07 ml      Physical Exam   General:  Well appearing. No resp difficulty HEENT: Ptosis right eye. Neck: supple. no JVD. Carotids 2+ bilat; no bruits. No lymphadenopathy or thryomegaly appreciated. Cor: PMI nondisplaced. Regular rate & rhythm. No rubs, gallops or murmurs. Lungs: CTA Abdomen: soft, nontender, nondistended. No hepatosplenomegaly. No bruits or masses. Good bowel sounds. Extremities: no cyanosis, clubbing, rash, edema Neuro: alert & orientedx3, cranial nerves grossly intact. moves all 4 extremities w/o difficulty. Affect pleasant    Telemetry   Sinus 80s-90s (personally reviewed)   Labs    CBC Recent Labs    12/02/20 0421 12/03/20 0340 12/03/20 0819  WBC 7.9 8.7  --   HGB 11.3* 12.0 12.2  11.9*  HCT 35.6* 38.2 36.0  35.0*  MCV 87.7 87.6  --   PLT 136* 141*  --    Basic Metabolic Panel Recent Labs    12/03/20 0340 12/03/20 0819 12/03/20 1033 12/03/20 1541 12/04/20 0505  NA 133* 136  136  --   --  136  K 4.4 4.6  4.5   < > 4.4  4.3  CL 97*  --   --   --  99  CO2 25  --   --   --  25  GLUCOSE 102*  --   --   --  59*  BUN 33*  --   --   --  36*  CREATININE 1.29*  --   --   --  1.49*  CALCIUM 9.8  --   --   --  9.5  MG 2.4  --   --   --  2.2  PHOS 6.3*  --   --   --  5.3*   < > = values in this interval not displayed.   Liver Function Tests Recent Labs    12/02/20 0421  AST 123*  ALT 611*  ALKPHOS 135*  BILITOT 1.2  PROT 6.4*  ALBUMIN 3.0*   No results for input(s): LIPASE, AMYLASE in the last 72 hours.  Cardiac Enzymes No results for input(s): CKTOTAL, CKMB, CKMBINDEX, TROPONINI in the  last 72 hours.  BNP: BNP (last 3 results) Recent Labs    11/23/20 0224 11/28/20 1511  BNP 48.9 529.6*    ProBNP (last 3 results) No results for input(s): PROBNP in the last 8760 hours.   D-Dimer No results for input(s): DDIMER in the last 72 hours. Hemoglobin A1C No results for input(s): HGBA1C in the last 72 hours. Fasting Lipid Panel Recent Labs    12/03/20 2141 12/04/20 0505  CHOL 435*  --   HDL 27*  --   LDLCALC UNABLE TO CALCULATE IF TRIGLYCERIDE OVER 400 mg/dL  --   TRIG 849* 673*  CHOLHDL 16.1  --   LDLDIRECT 234.3*  --    Thyroid Function Tests No results for input(s): TSH, T4TOTAL, T3FREE, THYROIDAB in the last 72 hours.  Invalid input(s): FREET3   Other results:   Imaging    CARDIAC CATHETERIZATION  Result Date: 12/03/2020 Right and left heart catheterization 12/03/2020: Normal right heart catheterization pressures.  RA 4/1, mean 1; RV 23/4, EDP 6; PA 22/8, mean 11 and PA saturation 76%.  PW 9/7, mean 7 mmHg. CO 5.6, CI 3.88.  QP/QS 1.00. LV: 136/9, EDP 17 mmHg.  Ao 131/86, mean 105 mmHg.  No pressure gradient across the aortic valve. RCA: A very small but dominant RCA which is diffusely diseased around 80%.  Mid segment 99% occlusion.  Conus branch collateralizes distal RCA. LM: Large, smooth and normal. LAD: Large vessel, minimal diffuse disease.  Small to moderate-sized D1 and D2. CX: Large vessel.  Minimal diffuse disease.  Gives origin to large OM1 and OM 2. Impression: Nonischemic cardiomyopathy, single vessel occlusion does not explain her markedly reduced LVEF.  Medical therapy for CAD and nonischemic cardiomyopathy.  I have started her on carvedilol 3.125 mg twice daily.     Medications:     Scheduled Medications:  aspirin EC  81 mg Oral Daily   carvedilol  3.125 mg Oral BID WC   Chlorhexidine Gluconate Cloth  6 each Topical Daily   cholecalciferol  1,000 Units Oral Daily   digoxin  0.125 mg Oral Daily   DULoxetine  60 mg Oral Daily    famotidine  10 mg Oral Daily   fenofibrate  160 mg Oral Daily   heparin injection (subcutaneous)  5,000 Units Subcutaneous Q8H   hydrALAZINE  50 mg Oral Q8H   insulin aspart  0-15 Units Subcutaneous TID WC   insulin aspart  10 Units Subcutaneous TID WC   insulin detemir  33 Units Subcutaneous BID   ivabradine  5 mg Oral BID WC   levothyroxine  125 mcg Oral Q0600   multivitamin with minerals  1 tablet Oral Daily   norethindrone-ethinyl estradiol  1 tablet Oral Daily   pregabalin  300 mg Oral BID   rosuvastatin  40 mg Oral Daily   sacubitril-valsartan  1 tablet Oral BID   sodium chloride flush  10-40 mL Intracatheter Q12H   sodium chloride flush  3 mL Intravenous Q12H   sodium chloride flush  3 mL Intravenous Q12H   sodium chloride flush  3 mL Intravenous Q12H   sodium chloride flush  3 mL Intravenous Q12H    Infusions:  sodium chloride     sodium chloride Stopped (12/01/20 1212)   sodium chloride      PRN Medications: sodium chloride, sodium chloride, acetaminophen, diphenhydrAMINE, hydrALAZINE, HYDROmorphone (DILAUDID) injection, ondansetron (ZOFRAN) IV, oxyCODONE-acetaminophen, phenol, prochlorperazine, senna-docusate, sodium chloride flush, sodium chloride flush, sodium chloride flush  Assessment/Plan   1. Acute on chronic systolic HF -> cardiogenic shock - likely NICM - Echo 1/22: EF 35-40% Mod MR - Echo 11/28/20 20-25% with moderate RV dysfunction and mild septal flattening. Mod-severe TR - HS troponin 29 argues against acute myocarditis - Rheum serologies pending  - cMRI c/w prior myocarditis vs sarcoid. May have delayed chemo cardiotoxcity  - R/LHC with single vessel RCA occlusion, RA 1, LVEDP 17, Fick CO 5.6/CI 3.88 - off Jennifer, co-ox 62% 09/08. Check co-ox now.  - Diuresed well, off loop diuretics. Weight stable this am. Does not appear overloaded. Slight bump in creatinine last 2 days, 1.18 >1.29 > 1.49. - Entresto 49/51 bid.  - Carvedilol 3.125 mg BID -  Corlanor 5 mg BID - Start spiro tomorrow if Scr and K stable.  - no SGLT2i w/ T1DM  Will likely need to back down hydralazine as GDMT is titrated.  - Suspect she will likely need advanced therapies in the near future. Discussed potential VAD with [facility] transplant team.    2. AKI  - Due to ATN/shock. Improved w/ inotropic support  - Scr peaked at 3.91 09/03, down to 1.18 on 09/07. 1.49 today - Continue to follow BMET   3. Shock liver - CT shows severe hepatic steatosis - LFTs improving - recheck today - Statin restarted per CCM    4. 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 Hyponatremia - resolved - monitor    6. Type I DM - per TRH  7. Fall - fell out of bed on 11/29/20 - L-S spine films ok   8. Hyperkalemia - improved - K 4.3 - Continue to follow  9. CAD - Single vessel RCA occlusion (small co-dominant vessel) on cardiac cath 09/08 - Medical management. Continue aspirin and statin.    Will need close outpatient f/u in HF clinic.  Reviewed medications with patient - no barriers.  Length of Stay: [id number] LINDSAY N, PA-C  12/04/2020, 7:34 AM  Advanced Heart Failure Team Pager [phone] (M-F; 7a - 5p)  Please contact [facility] for night-coverage after hours (5p -7a ) and weekends on [email or web address]  Patient seen and examined with the above-signed Advanced Practice Provider and/or Housestaff. I personally reviewed laboratory data, imaging studies and relevant notes. I independently examined the patient and formulated the important aspects of the plan. I have edited the note to reflect any  of my changes or salient points. I have personally discussed the plan with the patient and/or family.  Cath films reviewed personally with Dr. Einar Gip. Very mild CAD. Feeling better. Outputs stable. HR and BP improved.   General:  Sitting up in bed No resp  difficulty HEENT: normal + R ptosis  Neck: supple. JVP flat Carotids 2+ bilat; no bruits. No lymphadenopathy or thryomegaly appreciated. Cor: PMI nondisplaced. Regular rate & rhythm. No rubs, gallops or murmurs. Lungs: clear Abdomen: soft, nontender, nondistended. No hepatosplenomegaly. No bruits or masses. Good bowel sounds. Extremities: no cyanosis, clubbing, rash, edema  L-sided weakness Neuro: alert & orientedx3, cranial nerves grossly intact. moves all 4 extremities w/o difficulty. Affect pleasant  Much improved from HF standpoint. I think she can likely go home soon from our perspective. Would stop hydralazine.   HF meds for d/c   Entresto 49/51  Corlanor 5 bid Carvedilol 3.125 bid Spiro 12.5 daily (start tomorrow)  Lasix 40 mg prn only for swelling. No SGLT2i with DM1  We will arrange outpatient f/u in HF Clinic to determine need for advanced therapies. Will need f/u in Lipid Clinic to manage hyperTGs.   Glori Bickers, MD  9:12 AM

## 2020-12-05 DIAGNOSIS — R570 Cardiogenic shock: Secondary | ICD-10-CM | POA: Diagnosis not present

## 2020-12-05 DIAGNOSIS — I951 Orthostatic hypotension: Secondary | ICD-10-CM | POA: Diagnosis not present

## 2020-12-05 DIAGNOSIS — E781 Pure hyperglyceridemia: Secondary | ICD-10-CM | POA: Diagnosis not present

## 2020-12-05 DIAGNOSIS — I428 Other cardiomyopathies: Secondary | ICD-10-CM | POA: Diagnosis not present

## 2020-12-05 LAB — GLUCOSE, CAPILLARY
Glucose-Capillary: 224 mg/dL — ABNORMAL HIGH (ref 70–99)
Glucose-Capillary: 411 mg/dL — ABNORMAL HIGH (ref 70–99)
Glucose-Capillary: 397 mg/dL — ABNORMAL HIGH (ref 70–99)
Glucose-Capillary: 351 mg/dL — ABNORMAL HIGH (ref 70–99)
Glucose-Capillary: 297 mg/dL — ABNORMAL HIGH (ref 70–99)
Glucose-Capillary: 277 mg/dL — ABNORMAL HIGH (ref 70–99)
Glucose-Capillary: 301 mg/dL — ABNORMAL HIGH (ref 70–99)
Glucose-Capillary: 306 mg/dL — ABNORMAL HIGH (ref 70–99)
Glucose-Capillary: 329 mg/dL — ABNORMAL HIGH (ref 70–99)
Glucose-Capillary: 287 mg/dL — ABNORMAL HIGH (ref 70–99)
Glucose-Capillary: 240 mg/dL — ABNORMAL HIGH (ref 70–99)
Glucose-Capillary: 307 mg/dL — ABNORMAL HIGH (ref 70–99)
Glucose-Capillary: 270 mg/dL — ABNORMAL HIGH (ref 70–99)
Glucose-Capillary: 256 mg/dL — ABNORMAL HIGH (ref 70–99)

## 2020-12-05 LAB — LIPID PANEL
Cholesterol: 344 mg/dL — ABNORMAL HIGH (ref 0–200)
LDL Cholesterol: UNDETERMINED mg/dL (ref 0–99)
Triglycerides: 1461 mg/dL — ABNORMAL HIGH (ref <150)
VLDL: UNDETERMINED mg/dL (ref 0–40)

## 2020-12-05 LAB — COOXEMETRY PANEL
Carboxyhemoglobin: 1.5 % (ref 0.5–1.5)
Methemoglobin: 1.7 % — ABNORMAL HIGH (ref 0.0–1.5)
O2 Saturation: 98.6 %
Total hemoglobin: 10 g/dL — ABNORMAL LOW (ref 12.0–16.0)

## 2020-12-05 LAB — BASIC METABOLIC PANEL
Anion gap: 9 (ref 5–15)
BUN: 40 mg/dL — ABNORMAL HIGH (ref 6–20)
CO2: 22 mmol/L (ref 22–32)
Calcium: 9 mg/dL (ref 8.9–10.3)
Chloride: 103 mmol/L (ref 98–111)
Creatinine, Ser: 1.47 mg/dL — ABNORMAL HIGH (ref 0.44–1.00)
GFR, Estimated: 49 mL/min — ABNORMAL LOW (ref >60)
Glucose, Bld: 246 mg/dL — ABNORMAL HIGH (ref 70–99)
Potassium: 4.9 mmol/L (ref 3.5–5.1)
Sodium: 134 mmol/L — ABNORMAL LOW (ref 135–145)

## 2020-12-05 LAB — LDL CHOLESTEROL, DIRECT: Direct LDL: 127.2 mg/dL — ABNORMAL HIGH (ref 0–99)

## 2020-12-05 MED ORDER — DEXTROSE 5 % IV BOLUS
1000.0000 mL | Freq: Once | INTRAVENOUS | Status: AC
  Administered 2020-12-05: 1000 mL via INTRAVENOUS

## 2020-12-05 MED ORDER — INSULIN (MYXREDLIN) INFUSION FOR HYPERTRIGLYCERIDEMIA
4.0000 [IU]/h | INTRAVENOUS | Status: DC
  Administered 2020-12-05: 4 [IU]/h via INTRAVENOUS

## 2020-12-05 MED ORDER — PREGABALIN 100 MG PO CAPS
100.0000 mg | ORAL_CAPSULE | Freq: Three times a day (TID) | ORAL | Status: DC
  Administered 2020-12-05 – 2020-12-06 (×3): 100 mg via ORAL
  Filled 2020-12-05 (×3): qty 1

## 2020-12-05 MED ORDER — PANCRELIPASE (LIP-PROT-AMYL) 12000-38000 UNITS PO CPEP
36000.0000 [IU] | ORAL_CAPSULE | Freq: Three times a day (TID) | ORAL | Status: DC
  Administered 2020-12-06: 36000 [IU] via ORAL
  Filled 2020-12-05: qty 3

## 2020-12-05 MED ORDER — INSULIN ASPART 100 UNIT/ML IJ SOLN
5.0000 [IU] | Freq: Once | INTRAMUSCULAR | Status: AC
  Administered 2020-12-05: 5 [IU] via SUBCUTANEOUS

## 2020-12-05 NOTE — Progress Notes (Signed)
Patient notified of order for NPO status and became upset, states that we do not understand her illness.  Advised that Dr. Arbutus Ped had spoken to endocrinologist at [facility] and this was based on their recommendations.  Remains upset and asking to speak with MD. Dr. Arbutus Ped notified, discussed patient's concerns, order for diet resumed per patient request.  Also discussed patients endocrinologist is Dr. Garrel Ridgel, he is not on call this weekend. Patient updated on situation.

## 2020-12-05 NOTE — Progress Notes (Signed)
Her CBG before lunch was 411.  Dr. Arbutus Ped made aware.  Idolina Primer, RN

## 2020-12-05 NOTE — Progress Notes (Signed)
PROGRESS NOTE    Jennifer [name]   MWU:[id number]  DOB: [birth year]  PCP: Sue Lush, PA-C    DOA: 11/22/2020 LOS: 12    Brief Narrative / Hospital Course to Date:   Ms. Toppins is a 30 yo female with PMH astrocytoma, DMII, NAFLD, LPL deficiency, HTN, hypothyroidism, PCOS, sCHF, chronic pancreatitis who presented with progressive worsening abdominal pain x 2 weeks and diarrhea, pt reported similar to prior pancreatitis.  No associated N/V.  Evaluation in the ED showed normal lipase and no signs of pancreatitis on CT abdomen/pelvis.  Due to CT findings distal aorta and bilateral iliac arteries concerning for possible vasculitis, patient was seen by vascular surgery.  Recommended outpatient follow up.  On 8/31, lipid panel showed severe hypertriglyceridemia with TG's 4030.  Hx of lipoprotein lipase deficiency, followed by endocrinology at [facility] (Dr. Garrel Ridgel).  Treated with insulin/dextrose infusion.  TG's trended down.  Lipase remained normal.    On 9/3 patient developed hypotension and renal function was worsening.  Nephrology and Cardiology were consulted.  Echo showed EF down at 25-30% with elevated LAP and RV volume overload.  Patient was transferred to [facility] and required Levophed and dobutamine in ICU.   follows annually with neurosurgery for her history of astrocytoma as a child which required chemo and may have contributed to her underlying CHF.    follows with endocrinology and cardiology for her diabetes and CHF respectively.   Assessment & Plan   Principal Problem:   Abdominal pain Active Problems:   DM (diabetes mellitus) (HCC)   Hypothyroidism   Chronic systolic CHF (congestive heart failure) (HCC)   Essential hypertension   Hypertriglyceridemia   Prolonged QT interval   AKI (acute kidney injury) ([place])   Hypotension   Acute combined systolic and diastolic heart failure (HCC)   Nonischemic cardiomyopathy (HCC)   Acute decompensated heart failure (HCC)    Elevated liver enzymes   Cardiogenic shock (HCC)   Shock liver   Diarrhea   Acute on Chronic Systolic CHF  >> Cardiogenic shock Non-ischemic cardiomyopathy Echo 1/22: EF 35-40% Mod MR Echo 11/28/20 20-25% with moderate RV dysfunction and mild septal flattening. Mod-severe TR. Cardiac MRI findings consistent with prior myocarditis vs sarcoidosis  L/R Cath 12/03/20 without significant CAD to any degree that would explain low EF, no PCI. --Mgmt per Heart Failure team --Off milrinone and hemodynamically stable.  9/9 - symptomatic orthostatic hypotension (with PT) --s/p diuresis --On Entresto, digoxin, ivabradine --Low dose Coreg started w/shock resolved --Consider Aldactone (K has been up) --Given type 1 DM, no SLGT2 inhibitor --Follow rheum serologgies --Cardiology has discussed with [facility] re potential need for advanced therapies including VAD  Orthostatic hypotension, symptomatic - see PT note of 9/9.   Fall precautions.  Defer medication changes to cardiology.  Consider midodrine.  Maintain MAP>65.   Abdominal pain due to  Hypertriglyceridemia >> Acute on Chronic Pancreatitis - POA with TG's over 4k.  Treated with insulin/dextrose infusion and TG's down-trending.  Abdominal pain improved. --continue fenofibrate and statin --pt to bring home Repatha or resume on d/c 9/10 - TG's from 600's to 1494.  --Resumed on insulin and D5 infusions. --Discussed with [facility] - per their endocrinologist on call, plasmapheresis indicated if TG's above 2000 or evidence of acute pancreatitis.  Continue on insulin/dextrose and NPO status. Call [facility] for transfer if plasmapheresis indicated or questions.    Transaminitis - due to shock liver.  LFT's improving.  Monitor LFT's  AKI - due to cardiogenic shock. Renal  function improving.  Monitor BMP.  Type 1 Diabetes with Hyperglycemia - adjusting insulin for inpatient goal 140-180.    Generalized weakness / Physical Deconditioning - PT and OT evaluations,  follow up recommendations.  Hypertension - on cardiac meds above.  Mgmt per cardiology.  Monitor BP.    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   Mild residual Left-sided weakness. ?cardiotoxicity from remote chemo?  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  High-risk for TG-induced pancreatitis.  Treated as above.   Hypothyroidism - on levothyroxine  Abnormal TSH - TSH low, free T4 normal.  Recheck in outpatient follow up.  Gastroparesis / Delayed gastric emptying - pt had gastric emptying study 08/25/20 showing 53% at 4 hours.    Neuropathy - resume [place]  Patient BMI: Body mass index is 25.43 kg/m.   DVT prophylaxis: heparin injection 5,000 Units Start: 11/27/20 1100   Diet:  Diet Orders (From admission, onward)     Start     Ordered   12/04/20 1123  Diet Carb Modified Fluid consistency: Thin; Room service appropriate? Yes; Fluid restriction: 1500 mL Fluid  Diet effective now       Question Answer Comment  Diet-HS Snack? Nothing   Calorie Level Medium 1600-2000   Fluid consistency: Thin   Room service appropriate? Yes   Fluid restriction: 1500 mL Fluid      12/04/20 1131              Code Status: Full Code   Subjective 12/05/20    Pt awake and in good spirits today, but really wants to go home.  Says feeling well, denies dizziness or lightheadedness today.  Discussed reasons not ready to d/c including hyperglycemia and triglycerides, also orthostatic hypotension.  Blood sugar was over 400 this AM after pt not eating yesterday and insulin was held.    Disposition Plan & Communication   Status is: Inpatient  Remains inpatient appropriate because:Inpatient level of care appropriate due to severity of illness.  On insulin infusion for recurrent severe hyperTG.  Pending cardiology clearance for d/c.  Orthostatic hypotension.  Dispo: The patient is from: Home              Anticipated d/c is to: Home               Patient currently is not medically stable to d/c.   Difficult to place patient No   Family Communication: husband at bedside on rounds 9/8, 9/10. Spoke with pt's mother on cell phone in room today 9/10.   Consults, Procedures, Significant Events   Consultants:  Cardiology / Heart Failure Nephrology PCCM Gastroenterology   Procedures:  L & R Heart Cath 12/03/20 - see report   Antimicrobials:  Anti-infectives (From admission, onward)    None         Micro    Objective   Vitals:   12/05/20 0930 12/05/20 1300 12/05/20 1322 12/05/20 1734  BP: (!) 147/86  119/64 (!) 171/96  Pulse:  (!) 116 (!) 101 99  Resp: 16  18   Temp: 98.1 F (36.7 C)  98.2 F (36.8 C) 98.7 F (37.1 C)  TempSrc: Axillary  Oral Oral  SpO2: 97% 99%  100%  Weight:      Height:        Intake/Output Summary (Last 24 hours) at 12/05/2020 1743 Last data filed at 12/05/2020 0900 Gross per 24 hour  Intake 340 ml  Output --  Net 340  ml   Filed Weights   12/02/20 0500 12/03/20 0100 12/05/20 0400  Weight: 54.4 kg 53.7 kg 53.3 kg    Physical Exam:  General exam: awake, alert, no acute distress Respiratory system: normal respiratory effort, on room air, lungs clear. Cardiovascular system: normal S1/S2, RRR   Gastrointestinal system: soft, NT, ND. Neurologic: A&Ox4, normal speech   Labs   Data Reviewed: I have personally reviewed following labs and imaging studies  CBC: Recent Labs  Lab 11/28/20 2256 11/29/20 0101 11/30/20 0211 12/01/20 0303 12/02/20 0421 12/03/20 0340 12/03/20 0819  WBC 11.5* 11.0* 8.4 7.8 7.9 8.7  --   NEUTROABS 9.4* 8.8*  --   --   --   --   --   HGB 9.3* 9.6* 10.2* 11.4* 11.3* 12.0 12.2  11.9*  HCT 28.7* 29.6* 30.4* 34.4* 35.6* 38.2 36.0  35.0*  MCV 87.0 87.3 87.6 86.4 87.7 87.6  --   PLT 91* 95* 123* 134* 136* 141*  --    Basic Metabolic Panel: Recent Labs  Lab 11/30/20 0211 12/01/20 0303 12/02/20 0421 12/02/20 9470 12/02/20 1744 12/02/20 2108  12/02/20 2313 12/03/20 0340 12/03/20 0819 12/03/20 1033 12/03/20 1541 12/04/20 0505 12/05/20 0500  NA 134* 135 133*   < > 132* 132*  --  133* 136  136  --   --  136 134*  K 3.8 3.6 5.8*   < > 6.3* 4.8   < > 4.4 4.6  4.5 4.7 4.4 4.3 4.9  CL 100 99 100   < > 98 97*  --  97*  --   --   --  99 103  CO2 17* 27 26   < > 23 26  --  25  --   --   --  25 22  GLUCOSE 228* 75 182*   < > 201* 195*  --  102*  --   --   --  59* 246*  BUN 47* 33* 30*   < > 30* 32*  --  33*  --   --   --  36* 40*  CREATININE 2.12* 1.36* 1.30*   < > 1.27* 1.18*  --  1.29*  --   --   --  1.49* 1.47*  CALCIUM 7.9* 8.5* 9.3   < > 9.9 9.6  --  9.8  --   --   --  9.5 9.0  MG 2.0 1.8 2.4  --   --   --   --  2.4  --   --   --  2.2  --   PHOS 2.9 4.0 4.6  --   --  6.0*  --  6.3*  --   --   --  5.3*  --    < > = values in this interval not displayed.   GFR: Estimated Creatinine Clearance: 39.7 mL/min (A) (by C-G formula based on SCr of 1.47 mg/dL (H)). Liver Function Tests: Recent Labs  Lab 11/29/20 0614 11/30/20 0500 12/01/20 0303 12/02/20 0421 12/04/20 0819  AST 1,252* 501* 318* 123* 46*  ALT 1,671* 1,251* 1,174* 611* 274*  ALKPHOS 124 139* 180* 135* 109  BILITOT 2.6* 1.6* 1.4* 1.2 0.8  PROT 6.0* RESULTS UNAVAILABLE DUE TO INTERFERING SUBSTANCE 6.8 6.4* 6.9  ALBUMIN 2.9* 3.2* 3.4* 3.0* 3.4*   No results for input(s): LIPASE, AMYLASE in the last 168 hours.  No results for input(s): AMMONIA in the last 168 hours.  Coagulation Profile: Recent Labs  Lab 11/28/20 1817  INR  1.5*   Cardiac Enzymes: No results for input(s): CKTOTAL, CKMB, CKMBINDEX, TROPONINI in the last 168 hours. BNP (last 3 results) No results for input(s): PROBNP in the last 8760 hours. HbA1C: No results for input(s): HGBA1C in the last 72 hours. CBG: Recent Labs  Lab 12/05/20 1331 12/05/20 1441 12/05/20 1534 12/05/20 1629 12/05/20 1727  GLUCAP 297* 277* 306* 301* 329*   Lipid Profile: Recent Labs    12/04/20 0925  12/05/20 0500  CHOL 412* 344*  HDL 25* NOT REPORTED DUE TO HIGH TRIGLYCERIDES  LDLCALC UNABLE TO CALCULATE IF TRIGLYCERIDE OVER 400 mg/dL UNABLE TO CALCULATE IF TRIGLYCERIDE OVER 400 mg/dL  TRIG 670* 1,461*  CHOLHDL 16.5 NOT REPORTED DUE TO HIGH TRIGLYCERIDES  LDLDIRECT 213.1* 127.2*   Thyroid Function Tests: No results for input(s): TSH, T4TOTAL, FREET4, T3FREE, THYROIDAB in the last 72 hours. Anemia Panel: Recent Labs    12/04/20 1501  VITAMINB12 1,340*  FOLATE 26.9   Sepsis Labs: No results for input(s): PROCALCITON, LATICACIDVEN in the last 168 hours.   Recent Results (from the past 240 hour(s))  MRSA Next Gen by PCR, Nasal     Status: None   Collection Time: 11/27/20 10:07 PM   Specimen: Nasal Mucosa; Nasal Swab  Result Value Ref Range Status   MRSA by PCR Next Gen NOT DETECTED NOT DETECTED Final    Comment: (NOTE) The GeneXpert MRSA Assay (FDA approved for NASAL specimens only), is one component of a comprehensive MRSA colonization surveillance program. It is not intended to diagnose MRSA infection nor to guide or monitor treatment for MRSA infections. Test performance is not FDA approved in patients less than 19 years old. Performed at [facility], [place] [address], [place], [place] [address]       Imaging Studies   No results found.   Medications   Scheduled Meds:  aspirin EC  81 mg Oral Daily   carvedilol  3.125 mg Oral BID WC   Chlorhexidine Gluconate Cloth  6 each Topical Daily   cholecalciferol  1,000 Units Oral Daily   digoxin  0.125 mg Oral Daily   DULoxetine  60 mg Oral Daily   famotidine  10 mg Oral Daily   feeding supplement  1 Container Oral BID BM   fenofibrate  160 mg Oral Daily   heparin injection (subcutaneous)  5,000 Units Subcutaneous Q8H   ivabradine  5 mg Oral BID WC   levothyroxine  125 mcg Oral Q0600   multivitamin with minerals  1 tablet Oral Daily   norethindrone-ethinyl estradiol  1 tablet Oral Daily    pregabalin  100 mg Oral TID   rosuvastatin  40 mg Oral Daily   sacubitril-valsartan  1 tablet Oral BID   sodium chloride flush  10-40 mL Intracatheter Q12H   sodium chloride flush  3 mL Intravenous Q12H   spironolactone  12.5 mg Oral Daily   Continuous Infusions:  sodium chloride Stopped (12/01/20 1212)   insulin 3.6 Units/hr (12/05/20 1442)       LOS: 12 days    Time spent: 45 minutes with > 50% spent at bedside and in coordination of care     Ezekiel Slocumb, DO [facility]  12/05/2020, 5:43 PM      If 7PM-7AM, please contact night-coverage. How to contact the [facility] Attending or Consulting provider [place] or covering provider during after hours [place], for this patient?    Check the care team in [facility] and look for a) attending/consulting [facility]  provider listed and b) the [facility] team listed Log into [email or web address] and use [place]'s universal password to access. If you do not have the password, please contact the hospital operator. Locate the [facility] provider you are looking for under [facility] and page to a number that you can be directly reached. If you still have difficulty reaching the provider, please page the [facility] (Director on Call) for the Hospitalists listed on amion for assistance.

## 2020-12-05 NOTE — Progress Notes (Signed)
PT Cancellation Note  Patient Details Name: Jennifer [name] MRN: [id number] DOB: [birth year]   Cancelled Treatment:    Reason Eval/Treat Not Completed: Medical issues which prohibited therapy - RN requesting hold today, pt about to start on insulin drip and SBP dropping to 70s when OOB. Will check back at a later date.  Stacie Glaze, PT DPT [facility] Services Pager [phone]  Office [phone]    Louis Matte 12/05/2020, 12:11 PM

## 2020-12-06 LAB — COMPREHENSIVE METABOLIC PANEL
ALT: 139 U/L — ABNORMAL HIGH (ref 0–44)
AST: 30 U/L (ref 15–41)
Albumin: 3.1 g/dL — ABNORMAL LOW (ref 3.5–5.0)
Alkaline Phosphatase: 90 U/L (ref 38–126)
Anion gap: 12 (ref 5–15)
BUN: 28 mg/dL — ABNORMAL HIGH (ref 6–20)
CO2: 20 mmol/L — ABNORMAL LOW (ref 22–32)
Calcium: 9.2 mg/dL (ref 8.9–10.3)
Chloride: 101 mmol/L (ref 98–111)
Creatinine, Ser: 1.28 mg/dL — ABNORMAL HIGH (ref 0.44–1.00)
GFR, Estimated: 58 mL/min — ABNORMAL LOW (ref >60)
Glucose, Bld: 131 mg/dL — ABNORMAL HIGH (ref 70–99)
Potassium: 4.4 mmol/L (ref 3.5–5.1)
Sodium: 133 mmol/L — ABNORMAL LOW (ref 135–145)
Total Bilirubin: 0.6 mg/dL (ref 0.3–1.2)
Total Protein: 6.3 g/dL — ABNORMAL LOW (ref 6.5–8.1)

## 2020-12-06 LAB — COOXEMETRY PANEL
Carboxyhemoglobin: 2.6 % — ABNORMAL HIGH (ref 0.5–1.5)
Methemoglobin: 1.6 % — ABNORMAL HIGH (ref 0.0–1.5)
O2 Saturation: 99.5 %
Total hemoglobin: 10 g/dL — ABNORMAL LOW (ref 12.0–16.0)

## 2020-12-06 LAB — GLUCOSE, CAPILLARY
Glucose-Capillary: 109 mg/dL — ABNORMAL HIGH (ref 70–99)
Glucose-Capillary: 113 mg/dL — ABNORMAL HIGH (ref 70–99)
Glucose-Capillary: 167 mg/dL — ABNORMAL HIGH (ref 70–99)
Glucose-Capillary: 183 mg/dL — ABNORMAL HIGH (ref 70–99)
Glucose-Capillary: 240 mg/dL — ABNORMAL HIGH (ref 70–99)
Glucose-Capillary: 257 mg/dL — ABNORMAL HIGH (ref 70–99)
Glucose-Capillary: 50 mg/dL — ABNORMAL LOW (ref 70–99)
Glucose-Capillary: 81 mg/dL (ref 70–99)

## 2020-12-06 LAB — TRIGLYCERIDES: Triglycerides: 1034 mg/dL — ABNORMAL HIGH (ref <150)

## 2020-12-06 LAB — ZINC: Zinc: 71 ug/dL (ref 44–115)

## 2020-12-06 LAB — COPPER, SERUM: Copper: 141 ug/dL (ref 80–158)

## 2020-12-06 MED ORDER — ADULT MULTIVITAMIN W/MINERALS CH: 1.0000 | ORAL_TABLET | Freq: Every day | ORAL | 1 refills | Status: DC

## 2020-12-06 MED ORDER — INSULIN DETEMIR 100 UNIT/ML ~~LOC~~ SOLN
30.0000 [IU] | Freq: Two times a day (BID) | SUBCUTANEOUS | Status: DC
  Administered 2020-12-06: 30 [IU] via SUBCUTANEOUS
  Filled 2020-12-06 (×2): qty 0.3

## 2020-12-06 MED ORDER — PANCRELIPASE (LIP-PROT-AMYL) 36000-114000 UNITS PO CPEP: 36000.0000 [IU] | ORAL_CAPSULE | Freq: Three times a day (TID) | ORAL | 0 refills | Status: DC

## 2020-12-06 MED ORDER — DEXTROSE 50 % IV SOLN: 1.0000 | Freq: Once | INTRAVENOUS | Status: DC

## 2020-12-06 MED ORDER — LEVOTHYROXINE SODIUM 125 MCG PO TABS: 125.0000 ug | ORAL_TABLET | Freq: Every day | ORAL | 1 refills | Status: DC

## 2020-12-06 MED ORDER — ASPIRIN 81 MG PO TBEC: 81.0000 mg | DELAYED_RELEASE_TABLET | Freq: Every day | ORAL | 1 refills | Status: DC

## 2020-12-06 MED ORDER — IVABRADINE HCL 5 MG PO TABS: 5.0000 mg | ORAL_TABLET | Freq: Two times a day (BID) | ORAL | 1 refills | Status: DC

## 2020-12-06 MED ORDER — NOVOLOG FLEXPEN 100 UNIT/ML ~~LOC~~ SOPN: 10.0000 [IU] | PEN_INJECTOR | Freq: Three times a day (TID) | SUBCUTANEOUS | 11 refills | Status: DC

## 2020-12-06 MED ORDER — SACUBITRIL-VALSARTAN 49-51 MG PO TABS: 1.0000 | ORAL_TABLET | Freq: Two times a day (BID) | ORAL | 1 refills | Status: DC

## 2020-12-06 MED ORDER — TOUJEO SOLOSTAR 300 UNIT/ML ~~LOC~~ SOPN: 35.0000 [IU] | PEN_INJECTOR | Freq: Two times a day (BID) | SUBCUTANEOUS | Status: DC

## 2020-12-06 MED ORDER — FENOFIBRATE 160 MG PO TABS: 160.0000 mg | ORAL_TABLET | Freq: Every day | ORAL | 1 refills | Status: DC

## 2020-12-06 MED ORDER — SPIRONOLACTONE 25 MG PO TABS: 12.5000 mg | ORAL_TABLET | Freq: Every day | ORAL | 1 refills | Status: DC

## 2020-12-06 MED ORDER — VITAMIN D3 25 MCG PO TABS: 1000.0000 [IU] | ORAL_TABLET | Freq: Every day | ORAL | 1 refills | Status: DC

## 2020-12-06 MED ORDER — DIGOXIN 125 MCG PO TABS
0.1250 mg | ORAL_TABLET | Freq: Every day | ORAL | 1 refills | Status: DC
Start: 2020-12-07 — End: 2021-02-12

## 2020-12-06 MED ORDER — FUROSEMIDE 20 MG PO TABS
20.0000 mg | ORAL_TABLET | Freq: Every day | ORAL | 0 refills | Status: DC | PRN
Start: 2020-12-06 — End: 2021-01-06

## 2020-12-06 MED ORDER — INSULIN ASPART 100 UNIT/ML IJ SOLN
0.0000 [IU] | Freq: Three times a day (TID) | INTRAMUSCULAR | Status: DC
  Administered 2020-12-06: 8 [IU] via SUBCUTANEOUS

## 2020-12-06 MED ORDER — CARVEDILOL 3.125 MG PO TABS: 3.1250 mg | ORAL_TABLET | Freq: Two times a day (BID) | ORAL | 1 refills | Status: DC

## 2020-12-06 MED ORDER — DEXTROSE 50 % IV SOLN
INTRAVENOUS | Status: AC
  Administered 2020-12-06: 25 mL
  Filled 2020-12-06: qty 50

## 2020-12-06 NOTE — Progress Notes (Signed)
Right Internal Jugular CVC dcd. Patient tolerated procedure well. VSS. No bleeding noted.Site unremarkable.Line intact when dcd.Occlusive dressing applied.

## 2020-12-06 NOTE — Discharge Summary (Signed)
Physician Discharge Summary  Jennifer [name] NWG:[id number] DOB: [birth year] DOA: 11/22/2020  PCP: Jennifer Lush, PA-C  Admit date: 11/22/2020 Discharge date: 12/06/2020  Admitted From: home Disposition:  home  Recommendations for Outpatient Follow-up:  Follow up with PCP in 1-2 weeks Please obtain BMP/CBC in one week Please follow up with Cardiology as scheduled Please follow up with Endocrinology at [facility]: None  Equipment/Devices: None   Discharge Condition: Stable  CODE STATUS: Full  Diet recommendation:  Heart Healthy / Carb Modified / LOW FAT      Discharge Diagnoses: Principal Problem:   Abdominal pain Active Problems:   DM (diabetes mellitus) ([place])   Hypothyroidism   Chronic systolic CHF (congestive heart failure) ([place])   Essential hypertension   Hypertriglyceridemia   Prolonged QT interval   AKI (acute kidney injury) ([place])   Hypotension   Acute combined systolic and diastolic heart failure ([place])   Nonischemic cardiomyopathy ([place])   Acute decompensated heart failure (H[facility])   Elevated liver enzymes   Cardiogenic shock ([place])   Shock liver   Diarrhea    Summary of HPI and Hospital Course:  [name] is a 30 yo female with PMH astrocytoma, DMII, NAFLD, LPL deficiency, HTN, hypothyroidism, PCOS, sCHF, chronic pancreatitis who presented with progressive worsening abdominal pain x 2 weeks and diarrhea, pt reported similar to prior pancreatitis.  No associated N/V.  Evaluation in the ED showed normal lipase and no signs of pancreatitis on CT abdomen/pelvis.  Due to CT findings distal aorta and bilateral iliac arteries concerning for possible vasculitis, patient was seen by vascular surgery.  Recommended outpatient follow up.  On 8/31, lipid panel showed severe hypertriglyceridemia with TG's 4030.  Hx of lipoprotein lipase deficiency, followed by endocrinology at [facility] (Dr. Garrel Ridgel).  Treated with insulin/dextrose infusion.  TG's trended down.  Lipase  remained normal.    On 9/3 patient developed hypotension and renal function was worsening.  Nephrology and Cardiology were consulted.  Echo showed EF down at 25-30% with elevated LAP and RV volume overload.  Patient was transferred to [facility] and required Levophed and dobutamine in ICU.   follows annually with neurosurgery for her history of astrocytoma as a child which required chemo and may have contributed to her underlying CHF.    follows with endocrinology and cardiology for her diabetes and CHF respectively.     Acute on Chronic Systolic CHF  >> Cardiogenic shock Non-ischemic cardiomyopathy Echo 1/22: EF 35-40% Mod MR Echo 11/28/20 20-25% with moderate RV dysfunction and mild septal flattening. Mod-severe TR. Cardiac MRI findings consistent with prior myocarditis vs sarcoidosis  L/R Cath 12/03/20 without significant CAD to any degree that would explain low EF, no PCI. --Mgmt per Heart Failure team --Off milrinone and hemodynamically stable.  9/9 - symptomatic orthostatic hypotension (with PT) --s/p diuresis --On Entresto, digoxin, ivabradine --Low dose Coreg started w/shock resolved --Consider Aldactone (K has been up) --Given type 1 DM, no SLGT2 inhibitor --Follow rheum serologgies --Cardiology has discussed with [facility] re potential need for advanced therapies including VAD   Orthostatic hypotension, symptomatic - see PT note of 9/9.   Fall precautions.  Defer medication changes to cardiology.  Consider midodrine.  Maintain MAP>65.    Abdominal pain due to  Hypertriglyceridemia >> Acute on Chronic Pancreatitis - POA with TG's over 4k.  Treated with insulin/dextrose infusion and TG's down-trending.  Abdominal pain improved. --continue fenofibrate and statin --pt to bring home Repatha or resume on d/c 9/10 - TG's from 600's  to 1494.  --Resumed on insulin and D5 infusions. --Discussed with [facility] - per their endocrinologist on call, plasmapheresis indicated if TG's above 2000 or  evidence of acute pancreatitis.   Continue on insulin/dextrose and NPO status.   Call [facility] for transfer if plasmapheresis indicated or questions.   Patient declined being NPO, stating her triglycerides are "always like this".  She did now show any signs of pancreatitis. TG's improved but still elevated at time of d/c.   Patient prefers to follow up on this chronic problem with her endocrinologist at [facility].     Transaminitis - due to shock liver.  LFT's improving.   Monitor LFT's   AKI - due to cardiogenic shock. Renal function improved.   Type 1 Diabetes with Hyperglycemia - covered with insulin for inpatient goal 140-180.  Patient to cautiously resume her home insulin regimen.  We discussed currently needing much less insulin that she takes at home.  She agrees to have close follow up with her outpatient provider.   Generalized weakness / Physical Deconditioning - PT and OT evaluations.  PT recommended outpatient PT.   Hypertension - on cardiac meds above.  Mgmt per cardiology.  Monitor BP.     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   Mild residual Left-sided weakness. ?cardiotoxicity from remote chemo?   Lipoprotein lipase deficiency - follows with endocrinology at [facility].  High-risk for TG-induced pancreatitis.  Treated as above.    Hypothyroidism - on levothyroxine  Abnormal TSH - TSH low, free T4 normal.   Recheck in outpatient follow up.   Gastroparesis / Delayed gastric emptying - pt had gastric emptying study 08/25/20 showing 53% at 4 hours.     Neuropathy - on Lyrica      Discharge Instructions   Discharge Instructions     (HEART FAILURE PATIENTS) Call MD:  Anytime you have any of the following symptoms: 1) 3 pound weight gain in 24 hours or 5 pounds in 1 week 2) shortness of breath, with or without a dry hacking cough 3) swelling in the hands, feet or stomach 4) if you have to sleep on extra pillows at night in order to breathe.   Complete by:  As directed    Call MD for:  extreme fatigue   Complete by: As directed    Call MD for:  persistant dizziness or light-headedness   Complete by: As directed    Call MD for:  persistant nausea and vomiting   Complete by: As directed    Call MD for:  severe uncontrolled pain   Complete by: As directed    Call MD for:  temperature >100.4   Complete by: As directed    Diet - low sodium heart healthy   Complete by: As directed    Discharge instructions   Complete by: As directed    BLOOD PRESSURE -  Recommend you check your BP at home twice a day until you see cardiology for follow up. Write down the readings to bring to your follow up doctor's appointments. If you feel dizzy or lightheaded when standing up - check your BP - it might be low.   BLOOD SUGAR / INSULIN REQUIREMENTS You're needed much less insulin here than you typically take.   For now, I've decreased your Toujeo to 35 units twice daily. Novolog with meals - I've decreased from 60 down to 10 units PLUS sliding scale (add on 0-15 units based on sliding scale below.  For glucose  70-120: none For 121-150: 2 units For 151-200: 3 units For 201-250: 5 units For 251-300: 8 units For 301-350: 11 units For 351-400: 15 units For > 400: 20 units and call your doctor   TRIGLYCERIDES Please contact your endocrinologist for follow up. If you develop symptoms of pancreatitis with worsening abdominal pain, nausea/vomiting, you should seek medical attention quickly.   Increase activity slowly   Complete by: As directed       Allergies as of 12/06/2020       Reactions   Ketamine    Clarithromycin Other (See Comments)   Upset stomach   Shellfish Allergy Swelling   Pt states contrast in CT is okay   Penicillins Rash   Has patient had a PCN reaction causing immediate rash, facial/tongue/throat swelling, SOB or lightheadedness with hypotension: Y Has patient had a PCN reaction causing severe rash involving mucus membranes or  skin necrosis: Y Has patient had a PCN reaction that required hospitalization: N Has patient had a PCN reaction occurring within the last 10 years: Y If all of the above answers are "NO", then may proceed with Cephalosporin use.   Prednisone Rash        Medication List     STOP taking these medications    ALPHA LIPOIC ACID-BIOTIN PO   Entresto 24-26 MG Generic drug: sacubitril-valsartan   Entresto 97-103 MG Generic drug: sacubitril-valsartan Replaced by: sacubitril-valsartan 49-51 MG   HYDROcodone-acetaminophen 5-325 MG tablet Commonly known as: NORCO/VICODIN   metoprolol 200 MG 24 hr tablet Commonly known as: TOPROL-XL   metoprolol succinate 50 MG 24 hr tablet Commonly known as: TOPROL-XL   metoprolol tartrate 100 MG tablet Commonly known as: LOPRESSOR       TAKE these medications    Acetyl L-Carnitine 500 MG Caps Take 500 mg by mouth daily.   albuterol 108 (90 Base) MCG/ACT inhaler Commonly known as: VENTOLIN HFA Inhale 2 puffs into the lungs as needed for wheezing or shortness of breath.   aspirin 81 MG EC tablet Take 1 tablet (81 mg total) by mouth daily. Swallow whole. Start taking on: December 07, 2020   Breo Ellipta 100-25 MCG/INH Aepb Generic drug: fluticasone furoate-vilanterol Inhale 1 puff into the lungs as needed (shortness of breath).   carvedilol 3.125 MG tablet Commonly known as: COREG Take 1 tablet (3.125 mg total) by mouth 2 (two) times daily with a meal.   COQ10 PO Take 1 tablet by mouth daily.   CRANBERRY FRUIT PO Take 1 capsule by mouth 2 (two) times a day.   cyclobenzaprine 10 MG tablet Commonly known as: FLEXERIL Take 10 mg by mouth as needed. for muscle spams   dicyclomine 10 MG capsule Commonly known as: BENTYL Take 10 mg by mouth 3 (three) times daily as needed for spasms.   digoxin 0.125 MG tablet Commonly known as: LANOXIN Take 1 tablet (0.125 mg total) by mouth daily. Start taking on: December 07, 2020    DULoxetine 30 MG capsule Commonly known as: CYMBALTA Take 60 mg by mouth daily.   famotidine 20 MG tablet Commonly known as: PEPCID Take 20 mg by mouth 2 (two) times daily.   fenofibrate 160 MG tablet Take 1 tablet (160 mg total) by mouth daily. Start taking on: December 07, 2020 What changed:  medication strength how much to take   furosemide 20 MG tablet Commonly known as: LASIX Take 1 tablet (20 mg total) by mouth daily as needed (Edema/Swelling). What changed:  when to take this reasons  to take this   ibuprofen 600 MG tablet Commonly known as: ADVIL Take 1 tablet (600 mg total) by mouth every 6 (six) hours as needed.   ivabradine 5 MG Tabs tablet Commonly known as: CORLANOR Take 1 tablet (5 mg total) by mouth 2 (two) times daily with a meal.   levothyroxine 125 MCG tablet Commonly known as: SYNTHROID Take 1 tablet (125 mcg total) by mouth daily at 6 (six) AM. Start taking on: December 07, 2020 What changed:  medication strength how much to take when to take this   lipase/protease/amylase 36000 UNITS Cpep capsule Commonly known as: CREON Take 1 capsule (36,000 Units total) by mouth 3 (three) times daily with meals.   multivitamin with minerals Tabs tablet Take 1 tablet by mouth daily. Start taking on: December 07, 2020   nitroGLYCERIN 0.4 MG SL tablet Commonly known as: NITROSTAT Place 1 tablet (0.4 mg total) under the tongue every 5 (five) minutes as needed for up to 25 days for chest pain.   norethindrone-ethinyl estradiol 0.4-35 MG-MCG tablet Commonly known as: OVCON-35 Take 1 tablet by mouth daily.   NovoLOG FlexPen 100 UNIT/ML FlexPen Generic drug: insulin aspart Inject 10-25 Units into the skin 3 (three) times daily. With meals.  Take 10 units PLUS 0-15 additional units per sliding scale instructions. What changed:  how much to take additional instructions   ondansetron 4 MG tablet Commonly known as: ZOFRAN Take 4 mg by mouth as needed for  nausea or vomiting.   pantoprazole 40 MG tablet Commonly known as: PROTONIX Take 40 mg by mouth daily.   pregabalin 300 MG capsule Commonly known as: LYRICA Take 300 mg by mouth 2 (two) times daily.   prochlorperazine 10 MG tablet Commonly known as: COMPAZINE Take 1 tablet (10 mg total) by mouth 2 (two) times daily as needed for nausea or vomiting.   rosuvastatin 40 MG tablet Commonly known as: CRESTOR Take 40 mg by mouth daily.   sacubitril-valsartan 49-51 MG Commonly known as: ENTRESTO Take 1 tablet by mouth 2 (two) times daily. Replaces: Entresto 97-103 MG   spironolactone 25 MG tablet Commonly known as: ALDACTONE Take 0.5 tablets (12.5 mg total) by mouth daily. Start taking on: December 07, 2020   Toujeo SoloStar 300 UNIT/ML Solostar Pen Generic drug: insulin glargine (1 Unit Dial) Inject 35 Units into the skin in the morning and at bedtime. What changed: how much to take   VITAMIN B12 PO Take 1 Dose by mouth daily.   Vitamin D3 25 MCG tablet Commonly known as: Vitamin D Take 1 tablet (1,000 Units total) by mouth daily. Start taking on: December 07, 2020        Follow-up Information     Jennifer [name], [place]. Schedule an appointment as soon as possible for a visit in 2 days.   Specialty: Physician Assistant Why: Follow up from ER visit Contact information: [place] [phone] [place] DEPT. Go to .   Specialty: Emergency Medicine Why: As needed Contact information: [place] [id number] [place]        Serafina Mitchell, MD. Schedule an appointment as soon as possible for a visit in 2 days.   Specialties: Vascular Surgery, Cardiology Why: Follow up from ER visit, abnormal CT. Contact information: [place] [address] [place]  [facility] Follow up on 12/16/2020.   Specialty: Cardiology Why: Advanced Heart Failure Clinic at [facility] at 11:30 am [place] [address] Contact information: [address] [phone] [place]        [name], MD Follow up in 2 week(s).   Specialty: Cardiology Contact information: [place] [address] [phone]                Allergies  Allergen Reactions   Ketamine    Clarithromycin Other (See Comments)    Upset stomach    Shellfish Allergy Swelling    Pt states contrast in CT is okay   Penicillins Rash    Has patient had a PCN reaction causing immediate rash, facial/tongue/throat swelling, SOB or lightheadedness with hypotension: Y Has patient had a PCN reaction causing severe rash involving mucus membranes or skin necrosis: Y Has patient had a PCN reaction that required hospitalization: N Has patient had a PCN reaction occurring within the last 10 years: Y If all of the above answers are "NO", then may proceed with Cephalosporin use.    Prednisone Rash     If you experience worsening of your admission symptoms, develop shortness of breath, life threatening emergency, suicidal or homicidal thoughts you must seek medical attention immediately by calling 911 or calling your MD immediately  if symptoms less severe.    Please note   You were cared for by a hospitalist during your hospital stay. If you have any questions about your discharge medications or the care you received while you were in the hospital after you are discharged, you can call the unit and asked to speak with the hospitalist on call if the hospitalist that took care of you is not available. Once you are discharged, your primary care physician will handle any further medical issues. Please note that NO REFILLS for any discharge medications will be authorized once you are discharged, as it is imperative that you  return to your primary care physician (or establish a relationship with a primary care physician if you do not have one) for your aftercare needs so that they can reassess your need for medications and monitor your lab values.   Consultations: Cardiology / Heart Failure Nephrology PCCM Gastroenterology    Procedures/Studies: DG Chest 1 View  Result Date: 11/23/2020 CLINICAL DATA:  Abdominal pain, vomiting and diarrhea. EXAM: CHEST  1 VIEW COMPARISON:  October 07, 2020 FINDINGS: There is no evidence of acute infiltrate, pleural effusion or pneumothorax. The heart size and mediastinal contours are within normal limits. The visualized skeletal structures are unremarkable. IMPRESSION: No active cardiopulmonary disease. Electronically Signed   By: Virgina Norfolk M.D.   On: 11/23/2020 01:55   DG Lumbar Spine 2-3 Views  Result Date: 11/29/2020 CLINICAL DATA:  Abdominal pain, previous fall EXAM: LUMBAR SPINE - 2-3 VIEW COMPARISON:  CT 11/24/2020 FINDINGS: There is no evidence of lumbar spine fracture. Alignment is normal. Intervertebral disc spaces are maintained. IMPRESSION: Negative. Electronically Signed   By: Lucrezia Europe M.D.   On: 11/29/2020 12:48   DG Sacrum/Coccyx  Result Date: 11/29/2020 CLINICAL DATA:  Abdominal pain, previous fall EXAM: SACRUM AND COCCYX - 2+ VIEW COMPARISON:  None. FINDINGS: There is no evidence of fracture or other focal bone lesions. IMPRESSION: Negative. Electronically Signed   By: Lucrezia Europe M.D.   On: 11/29/2020 12:51   CT ABDOMEN PELVIS W CONTRAST  Result  Date: 11/22/2020 CLINICAL DATA:  Epigastric pain hx recurrent pancreatitis EXAM: CT ABDOMEN AND PELVIS WITH CONTRAST TECHNIQUE: Multidetector CT imaging of the abdomen and pelvis was performed using the standard protocol following bolus administration of intravenous contrast. CONTRAST:  39m OMNIPAQUE IOHEXOL 350 MG/ML SOLN COMPARISON:  CT abdomen pelvis 04/18/2020 FINDINGS: Lower chest: Bilateral lower lobe  subsegmental atelectasis. Hepatobiliary: Interval increase in size of the liver now measuring 21 cm (from 18.5 cm). The hepatic parenchyma is diffusely hypodense compared to the splenic parenchyma consistent with fatty infiltration. No focal liver abnormality. No gallstones, gallbladder wall thickening, or pericholecystic fluid. No biliary dilatation. Pancreas: No focal lesion. Normal pancreatic contour. No surrounding inflammatory changes. No main pancreatic ductal dilatation. Spleen: Interval development of a mildly enlarged spleen measuring up to 13.5 cm. No focal abnormality. Adrenals/Urinary Tract: No adrenal nodule bilaterally. Bilateral kidneys enhance symmetrically. No hydronephrosis. No hydroureter. The urinary bladder is unremarkable. Stomach/Bowel: Stomach is within normal limits. No evidence of bowel wall thickening or dilatation. Appendix appears normal. Vascular/Lymphatic: No abdominal aorta or iliac aneurysm. Similar-appearing mild circumferential hypodensity along the abdominal aorta and bilateral common iliac arteries. No abdominal, pelvic, or inguinal lymphadenopathy. Reproductive: Atrophic uterus. Otherwise uterus and bilateral adnexa are unremarkable. Other: No intraperitoneal free fluid. No intraperitoneal free gas. No organized fluid collection. Musculoskeletal: No abdominal wall hernia or abnormality. No suspicious lytic or blastic osseous lesions. No acute displaced fracture. Multilevel degenerative changes of the spine. IMPRESSION: 1. No CT findings to suggest acute pancreatitis. 2. Interval increase in hepatomegaly. 3. Hepatic steatosis. 4. Interval development of mild splenomegaly. 5. Atrophic uterus. 6. Similar-appearing mild circumferential hypodensity along the abdominal aorta and bilateral common iliac arteries. Finding may represent intimal thickening in the setting possible vasculitis. Electronically Signed   By: MIven FinnM.D.   On: 11/22/2020 23:06   CARDIAC  CATHETERIZATION  Result Date: 12/03/2020 Right and left heart catheterization 12/03/2020: Normal right heart catheterization pressures.  RA 4/1, mean 1; RV 23/4, EDP 6; PA 22/8, mean 11 and PA saturation 76%.  PW 9/7, mean 7 mmHg. CO 5.6, CI 3.88.  QP/QS 1.00. LV: 136/9, EDP 17 mmHg.  Ao 131/86, mean 105 mmHg.  No pressure gradient across the aortic valve. RCA: A very small but dominant RCA which is diffusely diseased around 80%.  Mid segment 99% occlusion.  Conus branch collateralizes distal RCA. LM: Large, smooth and normal. LAD: Large vessel, minimal diffuse disease.  Small to moderate-sized D1 and D2. CX: Large vessel.  Minimal diffuse disease.  Gives origin to large OM1 and OM 2. Impression: Nonischemic cardiomyopathy, single vessel occlusion does not explain her markedly reduced LVEF.  Medical therapy for CAD and nonischemic cardiomyopathy.  I have started her on carvedilol 3.125 mg twice daily.   UKoreaAbdomen Limited  Result Date: 11/28/2020 CLINICAL DATA:  Elevated LFTs. EXAM: ULTRASOUND ABDOMEN LIMITED RIGHT UPPER QUADRANT COMPARISON:  Ultrasound 04/18/2020. Abdomen pelvis CT 11/22/2020, CT 11/24/2020 FINDINGS: Gallbladder: Partially distended. Borderline wall thickening of 3 mm. Small volume pericholecystic fluid. No gallstones. Positive sonographic Murphy sign noted by sonographer. Common bile duct: Diameter: 5-6 mm. Liver: Heterogeneous and diffusely increased in parenchymal echogenicity. The liver parenchyma is difficult to penetrate. Less echogenic parenchyma adjacent to the gallbladder fossa may be related to edema or focal fatty sparing. No capsular nodularity. Portal vein is patent on color Doppler imaging with normal direction of blood flow towards the liver. Other: Trace free fluid adjacent to the liver. IMPRESSION: 1. Heterogeneous increased hepatic echogenicity consistent with steatosis. 2.  No gallstones, however there is equivocal gallbladder wall thickening with small amount of pericholecystic  fluid and a positive sonographic Murphy sign reported by the sonographer. This may be reactive due to liver disease, however possibility of acalculous cholecystitis is raised. As clinically indicated, consider nuclear medicine hepatobiliary scan. 3. No biliary dilatation. Electronically Signed   By: Keith Rake M.D.   On: 11/28/2020 23:47   DG CHEST PORT 1 VIEW  Result Date: 11/29/2020 CLINICAL DATA:  Status post central line placement. EXAM: PORTABLE CHEST 1 VIEW COMPARISON:  Chest radiograph dated 11/23/2020. FINDINGS: The heart is enlarged. Minimal diffuse bilateral interstitial opacities may represent pulmonary edema. There is no pleural effusion or pneumothorax. A right internal jugular central venous catheter tip overlies the right atrium. IMPRESSION: Right internal jugular central venous catheter tip overlies the right atrium. No pneumothorax. Electronically Signed   By: Zerita Boers M.D.   On: 11/29/2020 12:01   MR CARDIAC MORPHOLOGY W WO CONTRAST  Result Date: 12/01/2020 CLINICAL DATA:  37F with chronic systolic heart failure p/w cardiogenic shock. Echo with EF 20-25% with moderate RV dysfunction Cardiac MRI  08/04/2017 ([facility]): 1. The left ventricle is normal in size and has moderately decreased function EF 42% There is mid to basal inferoseptal hypokinesis. The right ventricle is normal in size and has mildly decreased function. 2. There is evidence of subepicardial enhancement of the mid inferoseptal wall segment, which is compatible with myocarditis. There is also a very small focal subendocardial enhancement in the mid inferoseptum which is compatible with infarction. 3. There is evidence of myocardial edema of the mid epicardial inferoseptum which is compatible with acute myocarditis. 4. Small circumferential pericardial effusion EXAM: CARDIAC MRI TECHNIQUE: The patient was scanned on a 1.5 Tesla Siemens magnet. A dedicated cardiac coil was used. Functional imaging was done using Fiesta  sequences. 2,3, and 4 chamber views were done to assess for RWMA's. Modified Simpson's rule using a short axis stack was used to calculate an ejection fraction on a dedicated work Conservation officer, nature. The patient received 6 cc of Gadavist. After 10 minutes inversion recovery sequences were used to assess for infiltration and scar tissue. CONTRAST:  6 cc  of Gadavist FINDINGS: Left ventricle: -Mild dilatation -Severe systolic dysfunction -Mild nonspecific ECV elevation (28%) -Normal global and regional T2 values -Midwall LGE in basal to mid anterior/anterolateral walls LV EF:  29% (Normal 56-78%) Absolute volumes: LV EDV: 177m (Normal 52-141 mL) LV ESV: 1071m(Normal 13-51 mL) LV SV: 4248mNormal 33-97 mL) CO: 5.1L/min (Normal 2.7-6.0 L/min) Indexed volumes: LV EDV: 62m64m-m (Normal 41-81 mL/sq-m) LV ESV: 70mL81mm (Normal 12-21 mL/sq-m) LV SV: 28mL/46m (Normal 26-56 mL/sq-m) CI: 3.4L/min/sq-m (Normal 1.8-3.8 L/min/sq-m) Right ventricle: Normal size and systolic function RV EF: 55% (N65%al 47-80%) Absolute volumes: RV EDV: 89mL (58mal 58-154 mL) RV ESV: 40mL (N20ml 12-68 mL) RV SV: 49mL (No1m 35-98 mL) CO: 5.9L/min (Normal 2.7-6 L/min) Indexed volumes: RV EDV: 60mL/sq-m95mrmal 48-87 mL/sq-m) RV ESV: 27mL/sq-m 14mmal 11-28 mL/sq-m) RV SV: 33mL/sq-m (18mal 27-57 mL/sq-m) CI: 4.0L/min/sq-m (Normal 1.8-3.8 L/min/sq-m) Left atrium: Normal size Right atrium: Normal size Mitral valve: Mild regurgitation Aortic valve: Mild regurgitation Tricuspid valve: Moderate regurgitation Pulmonic valve: No regurgitation Aorta: Normal proximal ascending aorta Pericardium: Moderate effusion measuring up to 1cm adjacent to RV free wall IMPRESSION: 1. Midwall late gadolinium enhancement in basal to mid anterior/anterolateral walls. Differential diagnosis includes prior myocarditis (normal T2 values suggests no evidence of acute myocarditis) versus sarcoidosis. Would recommend cardiac PET to evaluate for  sarcoid. 2.  Mild  LV dilatation with severe systolic dysfunction (EF 37%) 3.  Normal RV size and systolic function (EF 62%) 4. Moderate pericardial effusion measuring up to 1cm adjacent to RV free wall Electronically Signed   By: Oswaldo Milian M.D.   On: 12/01/2020 23:21   ECHOCARDIOGRAM COMPLETE  Result Date: 11/28/2020    ECHOCARDIOGRAM REPORT   Patient Name:   Jennifer [name] Date of Exam: 11/28/2020 Medical Rec #:  [id number]         Height:       57.0 in Accession #:    [id number]        Weight:       130.1 lb Date of Birth:  11-04-1990         BSA:          1.498 m Patient Age:    30 years          BP:           112/81 mmHg Patient Gender: F                 HR:           80 bpm. Exam Location:  Inpatient Procedure: 2D Echo, Cardiac Doppler and Color Doppler Indications:    CHF  History:        Patient has prior history of Echocardiogram examinations, most                 recent 04/15/2019.  Sonographer:    Luisa Hart RDCS Referring Phys: [place]  1. Left ventricular ejection fraction, by estimation, is 25 to 30%. The left ventricle has severely decreased function. The left ventricle demonstrates global hypokinesis. Left ventricular diastolic parameters are consistent with Grade II diastolic dysfunction (pseudonormalization). Elevated left atrial pressure. There is the interventricular septum is flattened in diastole ('D' shaped left ventricle), consistent with right ventricular volume overload.  2. Right ventricular systolic function is mildly reduced. The right ventricular size is normal. There is mildly elevated pulmonary artery systolic pressure.  3. The mitral valve is normal in structure. Mild to moderate mitral valve regurgitation.  4. Tricuspid valve regurgitation is moderate to severe.  5. The aortic valve is normal in structure. Aortic valve regurgitation is mild. FINDINGS  Left Ventricle: Left ventricular ejection fraction, by estimation, is 25 to 30%. The left ventricle has severely  decreased function. The left ventricle demonstrates global hypokinesis. The left ventricular internal cavity size was normal in size. There is no left ventricular hypertrophy. The interventricular septum is flattened in diastole ('D' shaped left ventricle), consistent with right ventricular volume overload. Left ventricular diastolic parameters are consistent with Grade II diastolic dysfunction (pseudonormalization). Elevated left atrial pressure. Right Ventricle: The right ventricular size is normal. No increase in right ventricular wall thickness. Right ventricular systolic function is mildly reduced. There is mildly elevated pulmonary artery systolic pressure. The tricuspid regurgitant velocity  is 2.96 m/s, and with an assumed right atrial pressure of 3 mmHg, the estimated right ventricular systolic pressure is 48.5 mmHg. Left Atrium: Left atrial size was normal in size. Right Atrium: Right atrial size was normal in size. Pericardium: There is no evidence of pericardial effusion. Mitral Valve: The mitral valve is normal in structure. Mild to moderate mitral valve regurgitation, with centrally-directed jet. Tricuspid Valve: The tricuspid valve is normal in structure. Tricuspid valve regurgitation is moderate to severe. Aortic Valve: The aortic valve is normal in structure. Aortic valve regurgitation is mild.  Aortic regurgitation PHT measures 367 msec. Aortic valve mean gradient measures 3.0 mmHg. Aortic valve peak gradient measures 4.8 mmHg. Aortic valve area, by VTI measures 1.93 cm. Pulmonic Valve: The pulmonic valve was normal in structure. Pulmonic valve regurgitation is trivial. Aorta: The aortic root was not well visualized. IAS/Shunts: No atrial level shunt detected by color flow Doppler.  LEFT VENTRICLE PLAX 2D LVIDd:         4.40 cm     Diastology LVIDs:         3.80 cm     LV e' medial:    5.11 cm/s LV PW:         0.90 cm     LV E/e' medial:  17.8 LV IVS:        1.00 cm     LV e' lateral:   9.03 cm/s  LVOT diam:     1.80 cm     LV E/e' lateral: 10.1 LV SV:         37 LV SV Index:   25 LVOT Area:     2.54 cm  LV Volumes (MOD) LV vol d, MOD A4C: 70.3 ml LV vol s, MOD A4C: 51.3 ml LV SV MOD A4C:     70.3 ml RIGHT VENTRICLE RV S prime:     9.36 cm/s TAPSE (M-mode): 1.3 cm LEFT ATRIUM             Index       RIGHT ATRIUM           Index LA diam:        3.30 cm 2.20 cm/m  RA Area:     11.20 cm LA Vol (A2C):   27.7 ml 18.49 ml/m RA Volume:   21.70 ml  14.49 ml/m LA Vol (A4C):   22.8 ml 15.22 ml/m LA Biplane Vol: 25.3 ml 16.89 ml/m  AORTIC VALVE                   PULMONIC VALVE AV Area (Vmax):    2.18 cm    PV Vmax:          0.84 m/s AV Area (Vmean):   2.04 cm    PV Vmean:         57.000 cm/s AV Area (VTI):     1.93 cm    PV VTI:           0.140 m AV Vmax:           109.00 cm/s PV Peak grad:     2.8 mmHg AV Vmean:          73.900 cm/s PV Mean grad:     2.0 mmHg AV VTI:            0.191 m     PR End Diast Vel: 2.92 msec AV Peak Grad:      4.8 mmHg AV Mean Grad:      3.0 mmHg LVOT Vmax:         93.50 cm/s LVOT Vmean:        59.100 cm/s LVOT VTI:          0.145 m LVOT/AV VTI ratio: 0.76 AI PHT:            367 msec AR Vena Contracta: 0.20 cm  AORTA Ao Root diam: 2.20 cm Ao Asc diam:  2.50 cm MITRAL VALVE                TRICUSPID VALVE MV  Area (PHT): 6.27 cm     TR Peak grad:   35.0 mmHg MV Decel Time: 121 msec     TR Vmax:        296.00 cm/s MR Vena Contracta: 0.30 cm MR PISA:           1.01 cm SHUNTS MR PISA Radius:    0.40 cm  Systemic VTI:  0.14 m MV E velocity: 90.80 cm/s   Systemic Diam: 1.80 cm MV A velocity: 32.30 cm/s MV E/A ratio:  2.81 Mihai Croitoru MD Electronically signed by Sanda Klein MD Signature Date/Time: 11/28/2020/1:16:35 PM    Final    [place] EKG SITE RITE  Result Date: 11/28/2020 If Site Rite image not attached, placement could not be confirmed due to current cardiac rhythm.  CT Angio Abd/Pel w/ and/or w/o  Result Date: 11/24/2020 CLINICAL DATA:  Possible acute mesenteric ischemia. Mid  abdominal pain and nausea EXAM: CTA ABDOMEN AND PELVIS WITHOUT AND WITH CONTRAST TECHNIQUE: Multidetector CT imaging of the abdomen and pelvis was performed using the standard protocol during bolus administration of intravenous contrast. Multiplanar reconstructed images and MIPs were obtained and reviewed to evaluate the vascular anatomy. CONTRAST:  52m OMNIPAQUE IOHEXOL 350 MG/ML SOLN COMPARISON:  Prior CT scan of the abdomen 11/22/2020 FINDINGS: VASCULAR Aorta: Normal caliber aorta without aneurysm, dissection, vasculitis or significant stenosis. Mild fibrofatty atherosclerotic plaque. Celiac: Patent without evidence of aneurysm, dissection, vasculitis or significant stenosis. Variant anatomy. Early branching of the common hepatic artery at the origin of the celiac axis. SMA: Patent without evidence of aneurysm, dissection, vasculitis or significant stenosis. Renals: 2 right-sided renal arteries and solitary left renal artery, all of which are patent without evidence of aneurysm, dissection, vasculitis, fibromuscular dysplasia or significant stenosis. IMA: Patent without evidence of aneurysm, dissection, vasculitis or significant stenosis. Inflow: Focal fibrofatty atherosclerotic plaque in the proximal left common iliac artery results in mild stenosis. Additionally, there is a small penetrating atherosclerotic ulcer. Proximal Outflow: Bilateral common femoral and visualized portions of the superficial and profunda femoral arteries are patent without evidence of aneurysm, dissection, vasculitis or significant stenosis. Veins: No focal venous abnormality. Review of the MIP images confirms the above findings. NON-VASCULAR Lower chest: Dependent atelectasis.  No acute abnormality. Hepatobiliary: Impressive hepatomegaly and advanced hepatic steatosis. No discrete hepatic lesion. Minimal sparing around the gallbladder fossa. Pancreas: Unremarkable. No pancreatic ductal dilatation or surrounding inflammatory changes.  Spleen: Normal in size without focal abnormality. Adrenals/Urinary Tract: Adrenal glands are unremarkable. Kidneys are normal, without renal calculi, focal lesion, or hydronephrosis. Bladder is unremarkable. Stomach/Bowel: Stomach is within normal limits. Appendix appears normal. No evidence of bowel wall thickening, distention, or inflammatory changes. Lymphatic: No suspicious lymphadenopathy. Reproductive: Small/atrophic uterus again noted. Bilateral adnexa are unremarkable. Other: No abdominal wall hernia or abnormality. No abdominopelvic ascites. Musculoskeletal: No acute or significant osseous findings. IMPRESSION: VASCULAR 1. No evidence of acute or chronic mesenteric ischemia. 2. Mild distal aortic fibrofatty atherosclerotic plaque. Aortic Atherosclerosis (ICD10-I70.0). 3. Focal fibrofatty atherosclerotic plaque results in mild stenosis and a small penetrating atherosclerotic ulceration in the left common iliac artery. NON-VASCULAR 1. Hepatomegaly with advanced hepatic steatosis. 2. Mild dependent atelectasis. Electronically Signed   By: HJacqulynn CadetM.D.   On: 11/24/2020 15:19     L & R Heart Cath 12/03/20 - see report   Subjective: Pt feels well today.  No abdominal pain, N/V or other complaints.  [name] not worried about TG's being high, saying they're always like that.  Discussed she currently  needs much less insulin than her home regiment.  She agrees to watch sugars closely and get close follow up.   Discharge Exam: Vitals:   12/05/20 1734 12/05/20 1954  BP: (!) 171/96 (!) 134/95  Pulse: 99 (!) 106  Resp:  18  Temp: 98.7 F (37.1 C) 98.1 F (36.7 C)  SpO2: 100% 99%   Vitals:   12/05/20 1300 12/05/20 1322 12/05/20 1734 12/05/20 1954  BP:  119/64 (!) 171/96 (!) 134/95  Pulse: (!) 116 (!) 101 99 (!) 106  Resp:  18  18  Temp:  98.2 F (36.8 C) 98.7 F (37.1 C) 98.1 F (36.7 C)  TempSrc:  Oral Oral Oral  SpO2: 99%  100% 99%  Weight:      Height:        General: Pt is  alert, awake, not in acute distress Cardiovascular: RRR, S1/S2 +, no rubs, no gallops Respiratory: CTA bilaterally, no wheezing, no rhonchi Abdominal: Soft, NT, ND, bowel sounds + Extremities: no edema, no cyanosis    The results of significant diagnostics from this hospitalization (including imaging, microbiology, ancillary and laboratory) are listed below for reference.     Microbiology: Recent Results (from the past 240 hour(s))  MRSA Next Gen by PCR, Nasal     Status: None   Collection Time: 11/27/20 10:07 PM   Specimen: Nasal Mucosa; Nasal Swab  Result Value Ref Range Status   MRSA by PCR Next Gen NOT DETECTED NOT DETECTED Final    Comment: (NOTE) The GeneXpert MRSA Assay (FDA approved for NASAL specimens only), is one component of a comprehensive MRSA colonization surveillance program. It is not intended to diagnose MRSA infection nor to guide or monitor treatment for MRSA infections. Test performance is not FDA approved in patients less than 70 years old. Performed at [name], [place] [address], [place], [place] [address]      Labs: BNP (last 3 results) Recent Labs    11/23/20 0224 11/28/20 1511  BNP 48.9 967.8*   Basic Metabolic Panel: Recent Labs  Lab 11/30/20 0211 12/01/20 0303 12/02/20 0421 12/02/20 9381 12/02/20 2108 12/02/20 2313 12/03/20 0340 12/03/20 0819 12/03/20 1033 12/03/20 1541 12/04/20 0505 12/05/20 0500 12/06/20 0500  NA 134* 135 133*   < > 132*  --  133* 136  136  --   --  136 134* 133*  K 3.8 3.6 5.8*   < > 4.8   < > 4.4 4.6  4.5 4.7 4.4 4.3 4.9 4.4  CL 100 99 100   < > 97*  --  97*  --   --   --  99 103 101  CO2 17* 27 26   < > 26  --  25  --   --   --  25 22 20*  GLUCOSE 228* 75 182*   < > 195*  --  102*  --   --   --  59* 246* 131*  BUN 47* 33* 30*   < > 32*  --  33*  --   --   --  36* 40* 28*  CREATININE 2.12* 1.36* 1.30*   < > 1.18*  --  1.29*  --   --   --  1.49* 1.47* 1.28*  CALCIUM 7.9* 8.5* 9.3   < >  9.6  --  9.8  --   --   --  9.5 9.0 9.2  MG 2.0 1.8 2.4  --   --   --  2.4  --   --   --  2.2  --   --   PHOS 2.9 4.0 4.6  --  6.0*  --  6.3*  --   --   --  5.3*  --   --    < > = values in this interval not displayed.   Liver Function Tests: Recent Labs  Lab 11/30/20 0500 12/01/20 0303 12/02/20 0421 12/04/20 0819 12/06/20 0500  AST 501* 318* 123* 46* 30  ALT 1,251* 1,174* 611* 274* 139*  ALKPHOS 139* 180* 135* 109 90  BILITOT 1.6* 1.4* 1.2 0.8 0.6  PROT RESULTS UNAVAILABLE DUE TO INTERFERING SUBSTANCE 6.8 6.4* 6.9 6.3*  ALBUMIN 3.2* 3.4* 3.0* 3.4* 3.1*   No results for input(s): LIPASE, AMYLASE in the last 168 hours. No results for input(s): AMMONIA in the last 168 hours. CBC: Recent Labs  Lab 11/30/20 0211 12/01/20 0303 12/02/20 0421 12/03/20 0340 12/03/20 0819  WBC 8.4 7.8 7.9 8.7  --   HGB 10.2* 11.4* 11.3* 12.0 12.2  11.9*  HCT 30.4* 34.4* 35.6* 38.2 36.0  35.0*  MCV 87.6 86.4 87.7 87.6  --   PLT 123* 134* 136* 141*  --    Cardiac Enzymes: No results for input(s): CKTOTAL, CKMB, CKMBINDEX, TROPONINI in the last 168 hours. BNP: Invalid input(s): POCBNP CBG: Recent Labs  Lab 12/06/20 0313 12/06/20 0421 12/06/20 0459 12/06/20 0658 12/06/20 0754  GLUCAP 81 50* 109* 240* 257*   D-Dimer No results for input(s): DDIMER in the last 72 hours. Hgb A1c No results for input(s): HGBA1C in the last 72 hours. Lipid Profile Recent Labs    12/04/20 0925 12/05/20 0500 12/06/20 0500  CHOL 412* 344*  --   HDL 25* NOT REPORTED DUE TO HIGH TRIGLYCERIDES  --   LDLCALC UNABLE TO CALCULATE IF TRIGLYCERIDE OVER 400 mg/dL UNABLE TO CALCULATE IF TRIGLYCERIDE OVER 400 mg/dL  --   TRIG 670* 1,461* 1,034*  CHOLHDL 16.5 NOT REPORTED DUE TO HIGH TRIGLYCERIDES  --   LDLDIRECT 213.1* 127.2*  --    Thyroid function studies No results for input(s): TSH, T4TOTAL, T3FREE, THYROIDAB in the last 72 hours.  Invalid input(s): FREET3 Anemia work up Recent Labs    12/04/20 1501   VITAMINB12 1,340*  FOLATE 26.9   Urinalysis    Component Value Date/Time   COLORURINE YELLOW 11/22/2020 1734   APPEARANCEUR HAZY (A) 11/22/2020 1734   LABSPEC 1.015 11/22/2020 1734   PHURINE 5.0 11/22/2020 1734   GLUCOSEU 50 (A) 11/22/2020 1734   HGBUR NEGATIVE 11/22/2020 1734   [place] 11/22/2020 1734   KETONESUR NEGATIVE 11/22/2020 1734   PROTEINUR 30 (A) 11/22/2020 1734   NITRITE NEGATIVE 11/22/2020 1734   LEUKOCYTESUR SMALL (A) 11/22/2020 1734   Sepsis Labs Invalid input(s): PROCALCITONIN,  WBC,  LACTICIDVEN Microbiology Recent Results (from the past 240 hour(s))  MRSA Next Gen by PCR, Nasal     Status: None   Collection Time: 11/27/20 10:07 PM   Specimen: Nasal Mucosa; Nasal Swab  Result Value Ref Range Status   MRSA by PCR Next Gen NOT DETECTED NOT DETECTED Final    Comment: (NOTE) The GeneXpert MRSA Assay (FDA approved for NASAL specimens only), is one component of a comprehensive MRSA colonization surveillance program. It is not intended to diagnose MRSA infection nor to guide or monitor treatment for MRSA infections. Test performance is not FDA approved in patients less than 68 years old. Performed at [facility], [address] [place], [place]  [address]      Time coordinating discharge: Over 30 minutes  SIGNED:   Ezekiel Slocumb, DO [facility] 12/06/2020, 9:33 AM   If 7PM-7AM, please contact night-coverage [email or web address]

## 2020-12-06 NOTE — Progress Notes (Signed)
Pt was on insuline drip. Followed instructions of the EndoTool to titrate  down the insulin drip. Insulin was turned off when CBG was 80 based on the Endo tool instruction. BG was re-checked an hour later CBG was 50. Gave oral carbohydrate and called MD. MD order D50. Before administering D50, re-checked BG. CBG was 109.. Informed MD. 12.5g of D50 was administered based on communication with MD

## 2020-12-07 ENCOUNTER — Other Ambulatory Visit (HOSPITAL_COMMUNITY): Payer: Self-pay

## 2020-12-07 ENCOUNTER — Telehealth (HOSPITAL_COMMUNITY): Payer: Self-pay | Admitting: Pharmacy Technician

## 2020-12-07 NOTE — Telephone Encounter (Signed)
Patient Advocate Encounter   Received notification from [facility] that prior authorization for [place] is required.   PA submitted on CoverMyMeds Key [id number] Status is pending   Will continue to follow.

## 2020-12-08 LAB — VITAMIN C: Vitamin C: 0.5 mg/dL (ref 0.4–2.0)

## 2020-12-08 LAB — VITAMIN B1: Vitamin B1 (Thiamine): 141.6 nmol/L (ref 66.5–200.0)

## 2020-12-08 LAB — VITAMIN A: Vitamin A (Retinoic Acid): 75.1 ug/dL — ABNORMAL HIGH (ref 18.9–57.3)

## 2020-12-08 LAB — VITAMIN E
Vitamin E (Alpha Tocopherol): 34.3 mg/L — ABNORMAL HIGH (ref 5.9–19.4)
Vitamin E(Gamma Tocopherol): 1.8 mg/L (ref 0.7–4.9)

## 2020-12-09 ENCOUNTER — Other Ambulatory Visit (HOSPITAL_COMMUNITY): Payer: Self-pay

## 2020-12-10 LAB — VITAMIN K1, SERUM: VITAMIN K1: 3.77 ng/mL — ABNORMAL HIGH (ref 0.10–2.20)

## 2020-12-13 ENCOUNTER — Other Ambulatory Visit: Payer: Self-pay

## 2020-12-13 ENCOUNTER — Encounter (HOSPITAL_BASED_OUTPATIENT_CLINIC_OR_DEPARTMENT_OTHER): Payer: Self-pay | Admitting: *Deleted

## 2020-12-13 ENCOUNTER — Emergency Department (HOSPITAL_BASED_OUTPATIENT_CLINIC_OR_DEPARTMENT_OTHER)
Admission: EM | Admit: 2020-12-13 | Discharge: 2020-12-13 | Disposition: A | Discharge status: Home / Self Care | Payer: BC Managed Care – PPO | Attending: Emergency Medicine | Admitting: Emergency Medicine

## 2020-12-13 DIAGNOSIS — I110 Hypertensive heart disease with heart failure: Secondary | ICD-10-CM | POA: Diagnosis not present

## 2020-12-13 DIAGNOSIS — Z955 Presence of coronary angioplasty implant and graft: Secondary | ICD-10-CM | POA: Insufficient documentation

## 2020-12-13 DIAGNOSIS — E039 Hypothyroidism, unspecified: Secondary | ICD-10-CM | POA: Diagnosis not present

## 2020-12-13 DIAGNOSIS — N764 Abscess of vulva: Secondary | ICD-10-CM | POA: Insufficient documentation

## 2020-12-13 DIAGNOSIS — Z79899 Other long term (current) drug therapy: Secondary | ICD-10-CM | POA: Diagnosis not present

## 2020-12-13 DIAGNOSIS — E119 Type 2 diabetes mellitus without complications: Secondary | ICD-10-CM | POA: Insufficient documentation

## 2020-12-13 DIAGNOSIS — Z7982 Long term (current) use of aspirin: Secondary | ICD-10-CM | POA: Diagnosis not present

## 2020-12-13 DIAGNOSIS — N7689 Other specified inflammation of vagina and vulva: Secondary | ICD-10-CM | POA: Diagnosis present

## 2020-12-13 DIAGNOSIS — Z794 Long term (current) use of insulin: Secondary | ICD-10-CM | POA: Diagnosis not present

## 2020-12-13 DIAGNOSIS — L0291 Cutaneous abscess, unspecified: Secondary | ICD-10-CM

## 2020-12-13 DIAGNOSIS — I5041 Acute combined systolic (congestive) and diastolic (congestive) heart failure: Secondary | ICD-10-CM | POA: Insufficient documentation

## 2020-12-13 MED ORDER — SULFAMETHOXAZOLE-TRIMETHOPRIM 800-160 MG PO TABS: 1.0000 | ORAL_TABLET | Freq: Two times a day (BID) | ORAL | 0 refills | Status: AC

## 2020-12-13 MED ORDER — SULFAMETHOXAZOLE-TRIMETHOPRIM 800-160 MG PO TABS
1.0000 | ORAL_TABLET | Freq: Once | ORAL | Status: AC
  Administered 2020-12-13: 1 via ORAL
  Filled 2020-12-13: qty 1

## 2020-12-13 NOTE — ED Triage Notes (Signed)
C/o "vaginal cyst" that started on Wednesday. C/o fever for the past 24 hours.took tylenol 2 hours ago. States cyst has drained " a little"

## 2020-12-13 NOTE — ED Provider Notes (Signed)
[facility] EMER[facility]ENC[facility] DEPT Provider Note  CSN: [id number] Arrival date & time: 12/13/20 0118  Chief Complaint(s) Other (Cyst )  HPI Jennifer [name] is a 30 y.o. female here for 5 days of gradually worsening right labial pain and swelling. Reports she has a cyst. Pain is throbbing. Worse with palpation. No alleviating factors. She tried squeezing it which made it worse. Reports prior abscess from shaving. Has not shaved recently.   HPI  Past Medical History Past Medical History:  Diagnosis Date   Astrocytoma ([place])    Brain tumor ([place]) 03/29/1995   Cholesterosis    Diabetes mellitus without complication ([place])    DM (diabetes mellitus) ([place]) 10/10/2018   Fatty liver    HTN (hypertension) 10/10/2018   Hypertension    Hypothyroidism 10/10/2018   Hypothyroidism    Lipoprotein deficiency    Lung disease    longevity long term   Pancreatitis    Polycystic ovary syndrome    Thyroid disease    Patient Active Problem List   Diagnosis Date Noted   Diarrhea    Shock liver    Hypotension 11/28/2020   Acute combined systolic and diastolic heart failure (HCC)    Nonischemic cardiomyopathy (HCC)    Acute decompensated heart failure (HCC)    Elevated liver enzymes    Cardiogenic shock (HCC)    AKI (acute kidney injury) ([place]) 11/26/2020   Abdominal pain 53/61/4431   Chronic systolic CHF (congestive heart failure) ([place]) 11/23/2020   Essential hypertension 11/23/2020   Hypertriglyceridemia 11/23/2020   Prolonged QT interval 11/23/2020   DM (diabetes mellitus) ([place]) 10/10/2018   Hypothyroidism 10/10/2018   Home Medication(s) Prior to Admission medications   Medication Sig Start Date End Date Taking? Authorizing Provider  sulfamethoxazole-trimethoprim (BACTRIM DS) 800-160 MG tablet Take 1 tablet by mouth 2 (two) times daily for 7 days. 12/13/20 12/20/20 Yes Alizandra Loh, Grayce Sessions, MD  Acetylcarnitine HCl (ACETYL L-CARNITINE) 500 MG CAPS Take 500 mg by mouth daily.     [provider]  albuterol (VENTOLIN HFA) 108 (90 Base) MCG/ACT inhaler Inhale 2 puffs into the lungs as needed for wheezing or shortness of breath.  12/31/18   [provider]  aspirin EC 81 MG EC tablet Take 1 tablet (81 mg total) by mouth daily. Swallow whole. 12/07/20   Ezekiel Slocumb, DO  carvedilol (COREG) 3.125 MG tablet Take 1 tablet (3.125 mg total) by mouth 2 (two) times daily with a meal. 12/06/20   Nicole Kindred A, DO  cholecalciferol (VITAMIN D) 25 MCG tablet Take 1 tablet (1,000 Units total) by mouth daily. 12/07/20   Ezekiel Slocumb, DO  Coenzyme Q10 (COQ10 PO) Take 1 tablet by mouth daily.    [provider]  CRANBERRY FRUIT PO Take 1 capsule by mouth 2 (two) times a day.    [provider]  Cyanocobalamin (VITAMIN B12 PO) Take 1 Dose by mouth daily.    [provider]  cyclobenzaprine (FLEXERIL) 10 MG tablet Take 10 mg by mouth as needed. for muscle spams 11/19/18   [provider]  dicyclomine (BENTYL) 10 MG capsule Take 10 mg by mouth 3 (three) times daily as needed for spasms.  02/11/19   [provider]  digoxin (LANOXIN) 0.125 MG tablet Take 1 tablet (0.125 mg total) by mouth daily. 12/07/20   Ezekiel Slocumb, DO  DULoxetine (CYMBALTA) 30 MG capsule Take 60 mg by mouth daily.  01/28/19 11/23/20  [provider]  famotidine (PEPCID) 20 MG tablet Take  20 mg by mouth 2 (two) times daily.  02/11/19   [provider]  fenofibrate 160 MG tablet Take 1 tablet (160 mg total) by mouth daily. 12/07/20   Nicole Kindred A, DO  fluticasone furoate-vilanterol (BREO ELLIPTA) 100-25 MCG/INH AEPB Inhale 1 puff into the lungs as needed (shortness of breath).  01/21/19   [provider]  furosemide (LASIX) 20 MG tablet Take 1 tablet (20 mg total) by mouth daily as needed (Edema/Swelling). 12/06/20   Nicole Kindred A, DO  ibuprofen (ADVIL) 600 MG tablet Take 1 tablet (600 mg total) by mouth every 6 (six)  hours as needed. 10/19/18   Law, Bea Graff, PA-C  insulin glargine, 1 Unit Dial, (TOUJEO SOLOSTAR) 300 UNIT/ML Solostar Pen Inject 35 Units into the skin in the morning and at bedtime. 12/06/20   Ezekiel Slocumb, DO  ivabradine (CORLANOR) 5 MG TABS tablet Take 1 tablet (5 mg total) by mouth 2 (two) times daily with a meal. 12/06/20   Ezekiel Slocumb, DO  levothyroxine (SYNTHROID) 125 MCG tablet Take 1 tablet (125 mcg total) by mouth daily at 6 (six) AM. 12/07/20   Ezekiel Slocumb, DO  lipase/protease/amylase (CREON) 36000 UNITS CPEP capsule Take 1 capsule (36,000 Units total) by mouth 3 (three) times daily with meals. 12/06/20   Ezekiel Slocumb, DO  Multiple Vitamin (MULTIVITAMIN WITH MINERALS) TABS tablet Take 1 tablet by mouth daily. 12/07/20   Ezekiel Slocumb, DO  nitroGLYCERIN (NITROSTAT) 0.4 MG SL tablet Place 1 tablet (0.4 mg total) under the tongue every 5 (five) minutes as needed for up to 25 days for chest pain. 08/01/19 08/26/19  [name], MD  norethindrone-ethinyl estradiol (OVCON-35) 0.4-35 MG-MCG tablet Take 1 tablet by mouth daily.    [provider]  NOVOLOG FLEXPEN 100 UNIT/ML FlexPen Inject 10-25 Units into the skin 3 (three) times daily. With meals.  Take 10 units PLUS 0-15 additional units per sliding scale instructions. 12/06/20   Ezekiel Slocumb, DO  ondansetron (ZOFRAN) 4 MG tablet Take 4 mg by mouth as needed for nausea or vomiting.  12/19/18   [provider]  pantoprazole (PROTONIX) 40 MG tablet Take 40 mg by mouth daily. 12/24/19   [provider]  pregabalin (LYRICA) 300 MG capsule Take 300 mg by mouth 2 (two) times daily.     [provider]  prochlorperazine (COMPAZINE) 10 MG tablet Take 1 tablet (10 mg total) by mouth 2 (two) times daily as needed for nausea or vomiting. 04/18/20   Pattricia Boss, MD  rosuvastatin (CRESTOR) 40 MG tablet Take 40 mg by mouth daily.    [provider]  sacubitril-valsartan (ENTRESTO) 49-51 MG  Take 1 tablet by mouth 2 (two) times daily. 12/06/20   Ezekiel Slocumb, DO  spironolactone (ALDACTONE) 25 MG tablet Take 0.5 tablets (12.5 mg total) by mouth daily. 12/07/20   Ezekiel Slocumb, DO  Past Surgical History Past Surgical History:  Procedure Laterality Date   ABDOMINAL SURGERY     pt states during miscarriage got her intestine   BRAIN SURGERY     EYE MUSCLE SURGERY Right 03/28/2014   RIGHT/LEFT HEART CATH AND CORONARY ANGIOGRAPHY N/A 12/03/2020   Procedure: RIGHT/LEFT HEART CATH AND CORONARY ANGIOGRAPHY;  Surgeon: [name], MD;  Location: [facility];  Service: Cardiovascular;  Laterality: N/A;   VENTRICULOSTOMY  03/28/1997   Family History Family History  Problem Relation Age of Onset   Diabetes Mother    Hypertension Mother    Hyperlipidemia Mother    Thyroid disease Mother    Hypertension Father    Diabetes Father     Social History Social History   Tobacco Use   Smoking status: Never   Smokeless tobacco: Never  Vaping Use   Vaping Use: Never used  Substance Use Topics   Alcohol use: Never   Drug use: Never   Allergies Ketamine, Clarithromycin, Shellfish allergy, Penicillins, and Prednisone  Review of Systems Review of Systems All other systems are reviewed and are negative for acute change except as noted in the HPI  Physical Exam Vital Signs  I have reviewed the triage vital signs BP (!) 183/102 (BP Location: Right Arm)   Pulse (!) 112   Temp 98.1 F (36.7 C) (Oral)   Resp 20   Ht 4' 11"  (1.499 m)   Wt 52.6 kg   LMP 11/19/2020 (Approximate)   SpO2 99%   BMI 23.43 kg/m   Physical Exam Vitals reviewed.  Constitutional:      General: [name] not in acute distress.    Appearance: [name] well-developed. [name] not diaphoretic.  HENT:     Head: Normocephalic and atraumatic.     Right Ear: External ear  normal.     Left Ear: External ear normal.     Nose: Nose normal.  Eyes:     General: No scleral icterus.    Conjunctiva/sclera: Conjunctivae normal.  Neck:     Trachea: Phonation normal.  Cardiovascular:     Rate and Rhythm: Normal rate and regular rhythm.  Pulmonary:     Effort: Pulmonary effort is normal. No respiratory distress.     Breath sounds: No stridor.  Abdominal:     General: There is no distension.  Genitourinary:    Labia:        Right: Tenderness present.     Musculoskeletal:        General: Normal range of motion.     Cervical back: Normal range of motion.  Neurological:     Mental Status: [name] alert and oriented to person, place, and time.  Psychiatric:        Behavior: Behavior normal.    ED Results and Treatments Labs (all labs ordered are listed, but only abnormal results are displayed) Labs Reviewed - No data to display  EKG  EKG Interpretation  Date/Time:    Ventricular Rate:    PR Interval:    QRS Duration:   QT Interval:    QTC Calculation:   R Axis:     Text Interpretation:         Radiology No results found.  Pertinent labs & imaging results that were available during my care of the patient were reviewed by me and considered in my medical decision making (see MDM for details).  Medications Ordered in ED Medications  sulfamethoxazole-trimethoprim (BACTRIM DS) 800-160 MG per tablet 1 tablet (1 tablet Oral Given 12/13/20 0309)                                                                                                                                     Procedures Ultrasound ED Soft Tissue  Date/Time: 12/13/2020 4:00 AM Performed by: Fatima Blank, MD Authorized by: Fatima Blank, MD   Procedure details:    Indications: localization of abscess     Transverse view:  Visualized   Longitudinal  view:  Visualized   Images: archived   Location:    Location: perineum     Side:  Right Findings:     abscess present .Marland KitchenIncision and Drainage  Date/Time: 12/13/2020 4:01 AM Performed by: Fatima Blank, MD Authorized by: Fatima Blank, MD   Consent:    Consent obtained:  Verbal   Consent given by:  Patient   Risks, benefits, and alternatives were discussed: yes     Risks discussed:  Incomplete drainage, pain and bleeding   Alternatives discussed:  Alternative treatment Universal protocol:    Patient identity confirmed:  Verbally with patient Location:    Type:  Abscess   Size:  1 cm   Location:  Anogenital   Anogenital location:  [place] Pre-procedure details:    Skin preparation:  Povidone-iodine Anesthesia:    Anesthesia method:  Local infiltration   Local anesthetic:  Lidocaine 2% WITH epi Procedure type:    Complexity:  Simple Procedure details:    Ultrasound guidance: yes     Needle aspiration: yes     Needle size:  18 G   Drainage:  Purulent and bloody   Drainage amount:  Moderate Post-procedure details:    Procedure completion:  Tolerated well, no immediate complications  (including critical care time)  Medical Decision Making / ED Course I have reviewed the nursing notes for this encounter and the patient's prior records (if available in EHR or on provided paperwork).  Jennifer [name] was evaluated in Emergency Department on 12/13/2020 for the symptoms described in the history of present illness. She was evaluated in the context of the global COVID-19 pandemic, which necessitated consideration that the patient might be at risk for infection with the SARS-CoV-2 virus that causes COVID-19. Institutional protocols and algorithms that pertain to the evaluation of patients at risk for COVID-19 are in a state of  rapid change based on information released by regulatory bodies including the CDC and federal and state organizations. These policies and  algorithms were followed during the patient's care in the ED.     Approx 1cm abscess to right labia. No overt cellulitis. Given option to treat with Abx alone, needle aspiration, or I&D. Patient chose aspiration - noted above. Given first dose of abx here.    Final Clinical Impression(s) / ED Diagnoses Final diagnoses:  Abscess   The patient appears reasonably screened and/or stabilized for discharge and I doubt any other medical condition or other [facility] requiring further screening, evaluation, or treatment in the ED at this time prior to discharge. Safe for discharge with strict return precautions.  Disposition: Discharge  Condition: Good  I have discussed the results, Dx and Tx plan with the patient/family who expressed understanding and agree(s) with the plan. Discharge instructions discussed at length. The patient/family was given strict return precautions who verbalized understanding of the instructions. No further questions at time of discharge.    ED Discharge Orders          Ordered    sulfamethoxazole-trimethoprim (BACTRIM DS) 800-160 MG tablet  2 times daily        12/13/20 0259             Follow Up: Sue Lush, PA-C [place] [id number] [phone]  Call  if symptoms do not improve or  worsen, in 3-5 days     This chart was dictated using voice recognition software.  Despite best efforts to proofread,  errors can occur which can change the documentation meaning.    Fatima Blank, MD 12/13/20 [phone]

## 2020-12-15 NOTE — Progress Notes (Signed)
Advanced Heart Failure Clinic Note    PCP: Sue Lush, PA-C PCP-Cardiologist: Dr. Einar Gip HF Cardiologist: Dr. Haroldine Laws  HPI: Jennifer [name] is a 30 y.o. 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   Has been followed by Dr. Einar Gip for her HF.    - cMRI  08/04/2017 ([facility]):  1. The left ventricle is normal in size and has moderately decreased function EF 42% There is mid to basal inferoseptal hypokinesis.  The right ventricle is normal in size and has mildly decreased function. 2. There is evidence of subepicardial enhancement of the mid inferoseptal wall segment, which is compatible with myocarditis. There is also a very small focal subendocardial enhancement in the mid inferoseptum which is compatible with infarction.  3. There is evidence of myocardial edema of the mid epicardial inferoseptum which is compatible with acute myocarditis. 4. Small circumferential pericardial effusion   - Echo (1/21): EF 40%. Mod MR/TR (Dr. Einar Gip) - Lexiscan (2/21): EF 38% Normal perfusion - Echo (1/22): EF 35-40% Mod MR   Admitted 11/22/20 with 2 weeks ab pain and diarrhea. Ab CT concerning for vasculitis (no pancreatitis).  Patient was evaluated by vascular surgery, no recommendations provided with possible outpatient follow-up with rheumatology. Her TG were found to be  4030. She was started on an insulin/dextrose infusion. Cardiology and Nephrology were consulted after her creatinine and LFTs continued to rise and she began developing hypotension. Echo 11/28/20 EF was found to be 20-25% with moderate RV dysfunction and mild septal flattening. Started on DBA 2.5. AHF consulted for further management. PICC placed to follow CVP and Co-Ox. BP were high and milrinone added to augment diuresis. cMRI c/w prior myocarditis vs sarcoid (see below).  Mod pericardial effusion also noted. LVEF 29%. RV normal. She underwent R/LHC showing single vessel RCA occlusion, RA 1, LVEDP 17, Fick CO 5.6/CI 3.88. She was able to be weaned of inotropes and GDMT titrated.    Today she returns for post hospitalization HF follow up with her husband. Getting stronger but still has chest pressure randomly, but no more frequent since being discharged. Heart palpitations are back and feels like she has some fluid on board. Breathing OK walking on flat surfaces, not lifting at work. Denies dizziness, edema, or PND/Orthopnea. Appetite ok. Weight at home 113-123 pounds. Taking all medications, but only taking Entresto once daily. She works FT at a preschool. Drinking more fluids during the day as she has been more thirsty. She does her own cooking but goes out or does take out 2-3 x/week. Asking about heart pump.  Cardiac Studies: - [facility] 12/03/20: Normal right heart catheterization pressures.   RA 4/1, mean 1; RV 23/4, EDP 6; PA 22/8, mean 11 and PA saturation 76%.  PW 9/7, mean 7 mmHg. CO 5.6, CI 3.88.  QP/QS 1.00. LV: 136/9, EDP 17 mmHg.  Ao 131/86, mean 105 mmHg.  No pressure gradient across the aortic valve.  RCA: A very small but dominant RCA which is diffusely diseased around 80%.  Mid segment 99% occlusion.  Conus branch collateralizes distal RCA. LM: Large, smooth and normal. LAD: Large vessel, minimal diffuse disease.  Small to moderate-sized D1 and D2. CX: Large vessel.  Minimal diffuse disease.  Gives origin to large OM1 and OM 2.   Impression: Nonischemic cardiomyopathy, single vessel occlusion does not explain her markedly reduced LVEF.  Medical therapy for  CAD and nonischemic cardiomyopathy.    - cMRI (12/01/20): LVEF 09% severe systolic dysfunction, normal RV size and systolic function, RV EF 32%, moderate pericardial effusion, midwall LGE basal to mid ant/anterolateral walls (myocarditis vs sarcoidosis; rec cardiac PET to evaluate for sarcoid).  - Echo  (1/21): EF 40%. Mod MR/TR (Dr. Einar Gip) - Lexiscan (2/21): EF 38% Normal perfusion - Echo (1/22): EF 35-40% Mod MR  Review of Systems: [y] = yes, [ ]  = no   General: Weight gain Blue.Reese ]; Weight loss [ ] ; Anorexia [ ] ; Fatigue [ ] ; Fever [ ] ; Chills [ ] ; Weakness [ ]   Cardiac: Chest pain/pressure Blue.Reese ]; Resting SOB [ ] ; Exertional SOB Blue.Reese ]; Orthopnea [ ] ; Pedal Edema [ ] ; Palpitations [ ] ; Syncope [ ] ; Presyncope [ ] ; Paroxysmal nocturnal dyspnea[ ]   Pulmonary: Cough [ ] ; Wheezing[ ] ; Hemoptysis[ ] ; Sputum [ ] ; Snoring [ ]   GI: Vomiting[ ] ; Dysphagia[ ] ; Melena[ ] ; Hematochezia [ ] ; Heartburn[ ] ; Abdominal pain [ ] ; Constipation [ ] ; Diarrhea [ ] ; BRBPR [ ]   GU: Hematuria[ ] ; Dysuria [ ] ; Nocturia[ ]   Vascular: Pain in legs with walking [ ] ; Pain in feet with lying flat [ ] ; Non-healing sores [ ] ; Stroke [ ] ; TIA [ ] ; Slurred speech [ ] ;  Neuro: Headaches[ ] ; Vertigo[ ] ; Seizures[ ] ; Paresthesias[ ] ;Blurred vision [ ] ; Diplopia [ ] ; Vision changes [ ]   Ortho/Skin: Arthritis [ ] ; Joint pain [ ] ; Muscle pain [ ] ; Joint swelling [ ] ; Back Pain [ ] ; Rash [ ]   Psych: Depression[ ] ; Anxiety[ ]   Heme: Bleeding problems [ ] ; Clotting disorders [ ] ; Anemia [ ]   Endocrine: Diabetes [y]; Thyroid dysfunction[ ]   Past Medical History:  Diagnosis Date   Astrocytoma ([place])    Brain tumor ([place]) 03/29/1995   Cholesterosis    Diabetes mellitus without complication ([place])    DM (diabetes mellitus) ([place]) 10/10/2018   Fatty liver    HTN (hypertension) 10/10/2018   Hypertension    Hypothyroidism 10/10/2018   Hypothyroidism    Lipoprotein deficiency    Lung disease    longevity long term   Pancreatitis    Polycystic ovary syndrome    Thyroid disease    Current Outpatient Medications  Medication Sig Dispense Refill   Acetylcarnitine HCl (ACETYL L-CARNITINE) 500 MG CAPS Take 500 mg by mouth daily.     albuterol (VENTOLIN HFA) 108 (90 Base) MCG/ACT inhaler Inhale 2 puffs into the lungs as needed for wheezing or  shortness of breath.      aspirin EC 81 MG EC tablet Take 1 tablet (81 mg total) by mouth daily. Swallow whole. 30 tablet 1   carvedilol (COREG) 3.125 MG tablet Take 1 tablet (3.125 mg total) by mouth 2 (two) times daily with a meal. 60 tablet 1   cholecalciferol (VITAMIN D) 25 MCG tablet Take 1 tablet (1,000 Units total) by mouth daily. 30 tablet 1   Coenzyme Q10 (COQ10 PO) Take 1 tablet by mouth daily.     CRANBERRY FRUIT PO Take 1 capsule by mouth 2 (two) times a day.     Cyanocobalamin (VITAMIN B12 PO) Take 1 Dose by mouth daily.     cyclobenzaprine (FLEXERIL) 10 MG tablet Take 10 mg by mouth as needed. for muscle spams     dicyclomine (BENTYL) 10 MG capsule Take 10 mg by mouth 3 (three) times daily as needed for spasms.      digoxin (LANOXIN) 0.125 MG tablet Take 1 tablet (0.125  mg total) by mouth daily. 30 tablet 1   DULoxetine (CYMBALTA) 30 MG capsule Take 30 mg by mouth daily.     famotidine (PEPCID) 20 MG tablet Take 20 mg by mouth 2 (two) times daily.      fenofibrate 160 MG tablet Take 1 tablet (160 mg total) by mouth daily. 30 tablet 1   fluticasone furoate-vilanterol (BREO ELLIPTA) 100-25 MCG/INH AEPB Inhale 1 puff into the lungs as needed (shortness of breath).      furosemide (LASIX) 20 MG tablet Take 1 tablet (20 mg total) by mouth daily as needed (Edema/Swelling). 30 tablet 0   ibuprofen (ADVIL) 600 MG tablet Take 1 tablet (600 mg total) by mouth every 6 (six) hours as needed. 30 tablet 0   insulin glargine, 1 Unit Dial, (TOUJEO SOLOSTAR) 300 UNIT/ML Solostar Pen Inject 35 Units into the skin in the morning and at bedtime.     ivabradine (CORLANOR) 5 MG TABS tablet Take 1 tablet (5 mg total) by mouth 2 (two) times daily with a meal. 60 tablet 1   levothyroxine (SYNTHROID) 125 MCG tablet Take 1 tablet (125 mcg total) by mouth daily at 6 (six) AM. 30 tablet 1   lipase/protease/amylase (CREON) 36000 UNITS CPEP capsule Take 1 capsule (36,000 Units total) by mouth 3 (three) times daily  with meals. 270 capsule 0   Multiple Vitamin (MULTIVITAMIN WITH MINERALS) TABS tablet Take 1 tablet by mouth daily. 30 tablet 1   norethindrone-ethinyl estradiol (OVCON-35) 0.4-35 MG-MCG tablet Take 1 tablet by mouth daily.     NOVOLOG FLEXPEN 100 UNIT/ML FlexPen Inject 10-25 Units into the skin 3 (three) times daily. With meals.  Take 10 units PLUS 0-15 additional units per sliding scale instructions. 15 mL 11   ondansetron (ZOFRAN) 4 MG tablet Take 4 mg by mouth as needed for nausea or vomiting.      pantoprazole (PROTONIX) 40 MG tablet Take 40 mg by mouth daily.     pregabalin (LYRICA) 300 MG capsule Take 300 mg by mouth 2 (two) times daily.      prochlorperazine (COMPAZINE) 10 MG tablet Take 1 tablet (10 mg total) by mouth 2 (two) times daily as needed for nausea or vomiting. 10 tablet 0   rosuvastatin (CRESTOR) 40 MG tablet Take 40 mg by mouth daily.     sacubitril-valsartan (ENTRESTO) 49-51 MG Take 1 tablet by mouth daily.     spironolactone (ALDACTONE) 25 MG tablet Take 0.5 tablets (12.5 mg total) by mouth daily. 30 tablet 1   sulfamethoxazole-trimethoprim (BACTRIM DS) 800-160 MG tablet Take 1 tablet by mouth 2 (two) times daily for 7 days. 14 tablet 0   nitroGLYCERIN (NITROSTAT) 0.4 MG SL tablet Place 1 tablet (0.4 mg total) under the tongue every 5 (five) minutes as needed for up to 25 days for chest pain. (Patient not taking: Reported on 12/16/2020) 25 tablet 3   No current facility-administered medications for this encounter.   Allergies  Allergen Reactions   Ketamine    Clarithromycin Other (See Comments)    Upset stomach    Shellfish Allergy Swelling    Pt states contrast in CT is okay   Penicillins Rash    Has patient had a PCN reaction causing immediate rash, facial/tongue/throat swelling, SOB or lightheadedness with hypotension: Y Has patient had a PCN reaction causing severe rash involving mucus membranes or skin necrosis: Y Has patient had a PCN reaction that required  hospitalization: N Has patient had a PCN reaction occurring within the last  10 years: Y If all of the above answers are "NO", then may proceed with Cephalosporin use.    Prednisone Rash   Social History   Socioeconomic History   Marital status: Married    Spouse name: Not on file   Number of children: 0   Years of education: Not on file   Highest education level: Not on file  Occupational History   Not on file  Tobacco Use   Smoking status: Never   Smokeless tobacco: Never  Vaping Use   Vaping Use: Never used  Substance and Sexual Activity   Alcohol use: Never   Drug use: Never   Sexual activity: Not on file  Other Topics Concern   Not on file  Social History Narrative   Not on file   Social Determinants of Health   Financial Resource Strain: Low Risk    Difficulty of Paying Living Expenses: Not very hard  Food Insecurity: No Food Insecurity   Worried About Running Out of Food in the Last Year: Never true   [place] in the Last Year: Never true  Transportation Needs: No Transportation Needs   Lack of Transportation (Medical): No   Lack of Transportation (Non-Medical): No  Physical Activity: Not on file  Stress: Not on file  Social Connections: Not on file  Intimate Partner Violence: Not on file   Family History  Problem Relation Age of Onset   Diabetes Mother    Hypertension Mother    Hyperlipidemia Mother    Thyroid disease Mother    Hypertension Father    Diabetes Father    BP 106/78   Pulse 84   Wt 54.4 kg (120 lb)   LMP 11/19/2020 (Approximate)   SpO2 99%   BMI 24.24 kg/m   Wt Readings from Last 3 Encounters:  12/16/20 54.4 kg (120 lb)  12/13/20 52.6 kg (116 lb)  12/05/20 53.3 kg (117 lb 8 oz)   PHYSICAL EXAM: General:  NAD. No resp difficulty HEENT: Right eye ptosis Neck: Supple. No JVD. Carotids 2+ bilat; no bruits. No lymphadenopathy or thryomegaly appreciated. Cor: PMI nondisplaced. Regular rate & rhythm. No rubs, gallops or  murmurs. Lungs: Clear Abdomen: Soft, nontender, nondistended. No hepatosplenomegaly. No bruits or masses. Good bowel sounds. Extremities: No cyanosis, clubbing, rash, edema Neuro: Alert & oriented x 3, cranial nerves grossly intact. Moves all 4 extremities w/o difficulty. Affect pleasant. Flaccid LUE  ECG: SR 84 bpm (personally reviewed).  ReDs: 43%  ASSESSMENT & PLAN: 1. Chronic Systolic Heart Failure - likely NICM - Echo (1/22): EF 35-40% Mod MR - Echo (11/28/20): EF 20-25% with moderate RV dysfunction and mild septal flattening. Mod-severe TR - Rheum serologies pending  - cMRI c/w prior myocarditis vs sarcoid. May have delayed chemo cardiotoxcity  - R/LHC with single vessel RCA occlusion, RA 1, LVEDP 17, Fick CO 5.6/CI 3.88 - NYHA II, volume up today, ReDs 43%. - Take lasix 20 mg bid x 3 days + 20 KCl, then lasix PRN - Continue Entresto 49/51 mg bid. Instructed to take twice a day. - Continue carvedilol 3.125 mg bid. - Continue Corlanor 5 mg bid. HR 84 today. - Continue spiro 12.5 mg daily. - Continue digoxin 0.125 mg daily.  - no SGLT2i w/ T1DM.  - [name] contracepting with OCPs, will need to avoid pregnancy with teratogenicity of current HF meds. - Discussed limiting her ice and fluids as well as salt consumption. - Suspect she will likely need advanced therapies in the  near future. Discussed potential VAD with [facility] transplant team.  - Labs today; repeat BMET in 10 days.  2. CAD - Single vessel RCA occlusion (small co-dominant vessel) on cardiac cath 09/08 - Medical management.  - No s/s ischemia. Current chest pressure consistent with symptoms prior to hospitalization and may be related to fluid today. - Continue aspirin + statin.   3. Severe hyperTG - due to LPL deficiency. - Continue fenofibrate.  - Discussed referral to [facility]id Clinic for further management. She prefers to continue with her endocrinologist at [facility] who currently manages/follows.   4. Type I DM - No  SGLT2i. - Follows w/ endocrinology at [facility].   Follow up in 3-4 weeks with APP to assess volume and for further medication titration, and then in 2-3 months with Dr. Haroldine Laws.  [place], FNP 12/16/20

## 2020-12-16 ENCOUNTER — Encounter (HOSPITAL_COMMUNITY): Payer: Self-pay

## 2020-12-16 ENCOUNTER — Telehealth (HOSPITAL_COMMUNITY): Payer: Self-pay | Admitting: *Deleted

## 2020-12-16 ENCOUNTER — Other Ambulatory Visit: Payer: Self-pay

## 2020-12-16 ENCOUNTER — Ambulatory Visit (HOSPITAL_COMMUNITY)
Admit: 2020-12-16 | Discharge: 2020-12-16 | Disposition: A | Discharge status: Home / Self Care | Payer: BC Managed Care – PPO | Source: Ambulatory Visit | Attending: Family Medicine | Admitting: Family Medicine

## 2020-12-16 VITALS — BP 106/78 | HR 84 | Wt 120.0 lb

## 2020-12-16 DIAGNOSIS — I2510 Atherosclerotic heart disease of native coronary artery without angina pectoris: Secondary | ICD-10-CM | POA: Insufficient documentation

## 2020-12-16 DIAGNOSIS — Z8249 Family history of ischemic heart disease and other diseases of the circulatory system: Secondary | ICD-10-CM | POA: Insufficient documentation

## 2020-12-16 DIAGNOSIS — K861 Other chronic pancreatitis: Secondary | ICD-10-CM | POA: Insufficient documentation

## 2020-12-16 DIAGNOSIS — I5022 Chronic systolic (congestive) heart failure: Secondary | ICD-10-CM

## 2020-12-16 DIAGNOSIS — R0789 Other chest pain: Secondary | ICD-10-CM | POA: Insufficient documentation

## 2020-12-16 DIAGNOSIS — E109 Type 1 diabetes mellitus without complications: Secondary | ICD-10-CM | POA: Diagnosis not present

## 2020-12-16 DIAGNOSIS — K760 Fatty (change of) liver, not elsewhere classified: Secondary | ICD-10-CM | POA: Diagnosis not present

## 2020-12-16 DIAGNOSIS — E119 Type 2 diabetes mellitus without complications: Secondary | ICD-10-CM | POA: Insufficient documentation

## 2020-12-16 DIAGNOSIS — Z794 Long term (current) use of insulin: Secondary | ICD-10-CM | POA: Insufficient documentation

## 2020-12-16 DIAGNOSIS — R002 Palpitations: Secondary | ICD-10-CM | POA: Diagnosis not present

## 2020-12-16 DIAGNOSIS — Z7982 Long term (current) use of aspirin: Secondary | ICD-10-CM | POA: Diagnosis not present

## 2020-12-16 DIAGNOSIS — I110 Hypertensive heart disease with heart failure: Secondary | ICD-10-CM | POA: Insufficient documentation

## 2020-12-16 DIAGNOSIS — Z79899 Other long term (current) drug therapy: Secondary | ICD-10-CM | POA: Insufficient documentation

## 2020-12-16 DIAGNOSIS — E781 Pure hyperglyceridemia: Secondary | ICD-10-CM | POA: Insufficient documentation

## 2020-12-16 LAB — CBC
HCT: 32.6 % — ABNORMAL LOW (ref 36.0–46.0)
Hemoglobin: 10.7 g/dL — ABNORMAL LOW (ref 12.0–15.0)
MCH: 28.3 pg (ref 26.0–34.0)
MCHC: 32.8 g/dL (ref 30.0–36.0)
MCV: 86.2 fL (ref 80.0–100.0)
Platelets: 447 10*3/uL — ABNORMAL HIGH (ref 150–400)
RBC: 3.78 MIL/uL — ABNORMAL LOW (ref 3.87–5.11)
RDW: 16 % — ABNORMAL HIGH (ref 11.5–15.5)
WBC: 8.2 10*3/uL (ref 4.0–10.5)
nRBC: 0 % (ref 0.0–0.2)

## 2020-12-16 NOTE — Telephone Encounter (Signed)
Left vm for pt to return call to schedule lab appt. Dig and cmet hemolyzed.

## 2020-12-16 NOTE — Patient Instructions (Addendum)
Take Entresto twice a day take lasix 20 mg twice a day times for 3 days. take 20 meq of your potasium medication for 3 days   Then resume lasix and potassium as needed   Labs today We will only contact you if something comes back abnormal or we need to make some changes. Otherwise no news is good news!  Labs needed in 10 days.  Your physician recommends that you schedule a follow-up appointment in: 3 weeks with NP and in 2-3 months with Dr. Haroldine Laws.  Do the following things EVERYDAY: Weigh yourself in the morning before breakfast. Write it down and keep it in a log. Take your medicines as prescribed Eat low salt foods--Limit salt (sodium) to 2000 mg per day.  Stay as active as you can everyday Limit all fluids for the day to less than 2 liters  At the [facility], you and your health needs are our priority. As part of our continuing mission to provide you with exceptional heart care, we have created designated Provider Care Teams. These Care Teams include your primary Cardiologist (physician) and Advanced Practice Providers (APPs- Physician Assistants and Nurse Practitioners) who all work together to provide you with the care you need, when you need it.   You may see any of the following providers on your designated Care Team at your next follow up: Dr Glori Bickers Dr Loralie Champagne Dr Patrice Paradise, NP Lyda Jester, [place] Ginnie Smart Audry Riles, PharmD   Please be sure to bring in all your medications bottles to every appointment.

## 2020-12-16 NOTE — Progress Notes (Signed)
ReDS Vest / Clip - 12/16/20 1300       ReDS Vest / Clip   Station Marker B    Ruler Value 23    ReDS Value Range High volume overload    ReDS Actual Value 43

## 2020-12-18 ENCOUNTER — Other Ambulatory Visit (HOSPITAL_COMMUNITY): Payer: Self-pay | Admitting: Family Medicine

## 2020-12-18 DIAGNOSIS — I5022 Chronic systolic (congestive) heart failure: Secondary | ICD-10-CM

## 2020-12-20 ENCOUNTER — Telehealth: Payer: Self-pay | Admitting: Cardiology

## 2020-12-20 NOTE — Telephone Encounter (Signed)
Pt called with volume overload - she wanted to continue lasix BID which I agreed.  [name] needing to prop up at night further to sleep but not so bad she needs to come to ER per pt.  Her BP is soft so difficult to increase lasix much.  She will call office on Monday for update.  She may need hospitalization, she is aware if symptoms increase to come to ER.

## 2020-12-21 ENCOUNTER — Telehealth (HOSPITAL_COMMUNITY): Payer: Self-pay | Admitting: Family Medicine

## 2020-12-21 NOTE — Telephone Encounter (Signed)
Received message from Cecilie Kicks, NP that patient called over the weekend with fluid overload and wanted to continue taking lasix bid. Called to check in on patient but unable to reach. Left message to return call.  Allena Katz, FNP-BC

## 2020-12-21 NOTE — Telephone Encounter (Signed)
Pt left VM returning call to [facility]. Pt says she feels better but her abdomen is still swollen/bloated.  Routed to [place]

## 2020-12-22 ENCOUNTER — Other Ambulatory Visit (HOSPITAL_COMMUNITY): Payer: Self-pay

## 2020-12-23 ENCOUNTER — Other Ambulatory Visit: Payer: Self-pay

## 2020-12-23 ENCOUNTER — Other Ambulatory Visit (HOSPITAL_COMMUNITY): Payer: Self-pay

## 2020-12-23 ENCOUNTER — Ambulatory Visit (HOSPITAL_COMMUNITY)
Admission: RE | Admit: 2020-12-23 | Discharge: 2020-12-23 | Disposition: A | Discharge status: Home / Self Care | Payer: BC Managed Care – PPO | Source: Ambulatory Visit | Attending: Internal Medicine | Admitting: Internal Medicine

## 2020-12-23 DIAGNOSIS — I5022 Chronic systolic (congestive) heart failure: Secondary | ICD-10-CM | POA: Diagnosis not present

## 2020-12-23 LAB — BASIC METABOLIC PANEL
Anion gap: 9 (ref 5–15)
BUN: 47 mg/dL — ABNORMAL HIGH (ref 6–20)
CO2: 19 mmol/L — ABNORMAL LOW (ref 22–32)
Calcium: 9 mg/dL (ref 8.9–10.3)
Chloride: 97 mmol/L — ABNORMAL LOW (ref 98–111)
Creatinine, Ser: 1.53 mg/dL — ABNORMAL HIGH (ref 0.44–1.00)
GFR, Estimated: 47 mL/min — ABNORMAL LOW (ref >60)
Glucose, Bld: 211 mg/dL — ABNORMAL HIGH (ref 70–99)
Potassium: 7.1 mmol/L (ref 3.5–5.1)
Sodium: 125 mmol/L — ABNORMAL LOW (ref 135–145)

## 2020-12-23 LAB — CBC
HCT: 31 % — ABNORMAL LOW (ref 36.0–46.0)
Hemoglobin: 11 g/dL — ABNORMAL LOW (ref 12.0–15.0)
MCH: 31.3 pg (ref 26.0–34.0)
MCHC: 35.5 g/dL (ref 30.0–36.0)
MCV: 88.1 fL (ref 80.0–100.0)
Platelets: 300 10*3/uL (ref 150–400)
RBC: 3.52 MIL/uL — ABNORMAL LOW (ref 3.87–5.11)
RDW: 16.8 % — ABNORMAL HIGH (ref 11.5–15.5)
WBC: 7.8 10*3/uL (ref 4.0–10.5)
nRBC: 0 % (ref 0.0–0.2)

## 2020-12-23 NOTE — Telephone Encounter (Signed)
[phone] Jennifer [name]) LMOM  However patient is also scheduled for labs 9/28 @ 215

## 2020-12-25 ENCOUNTER — Other Ambulatory Visit: Payer: Self-pay

## 2020-12-25 ENCOUNTER — Emergency Department (HOSPITAL_COMMUNITY)
Admission: EM | Admit: 2020-12-25 | Discharge: 2020-12-25 | Disposition: A | Discharge status: Home / Self Care | Payer: BC Managed Care – PPO | Attending: Emergency Medicine | Admitting: Emergency Medicine

## 2020-12-25 ENCOUNTER — Encounter (HOSPITAL_COMMUNITY): Payer: Self-pay

## 2020-12-25 ENCOUNTER — Emergency Department (HOSPITAL_COMMUNITY): Payer: BC Managed Care – PPO

## 2020-12-25 DIAGNOSIS — Z955 Presence of coronary angioplasty implant and graft: Secondary | ICD-10-CM | POA: Diagnosis not present

## 2020-12-25 DIAGNOSIS — Z794 Long term (current) use of insulin: Secondary | ICD-10-CM | POA: Insufficient documentation

## 2020-12-25 DIAGNOSIS — I110 Hypertensive heart disease with heart failure: Secondary | ICD-10-CM | POA: Diagnosis not present

## 2020-12-25 DIAGNOSIS — S0101XA Laceration without foreign body of scalp, initial encounter: Secondary | ICD-10-CM | POA: Insufficient documentation

## 2020-12-25 DIAGNOSIS — Z79899 Other long term (current) drug therapy: Secondary | ICD-10-CM | POA: Insufficient documentation

## 2020-12-25 DIAGNOSIS — E039 Hypothyroidism, unspecified: Secondary | ICD-10-CM | POA: Insufficient documentation

## 2020-12-25 DIAGNOSIS — E119 Type 2 diabetes mellitus without complications: Secondary | ICD-10-CM | POA: Insufficient documentation

## 2020-12-25 DIAGNOSIS — W19XXXA Unspecified fall, initial encounter: Secondary | ICD-10-CM

## 2020-12-25 DIAGNOSIS — W01198A Fall on same level from slipping, tripping and stumbling with subsequent striking against other object, initial encounter: Secondary | ICD-10-CM | POA: Diagnosis not present

## 2020-12-25 DIAGNOSIS — Y990 Civilian activity done for income or pay: Secondary | ICD-10-CM | POA: Insufficient documentation

## 2020-12-25 DIAGNOSIS — Z7984 Long term (current) use of oral hypoglycemic drugs: Secondary | ICD-10-CM | POA: Insufficient documentation

## 2020-12-25 DIAGNOSIS — S0990XA Unspecified injury of head, initial encounter: Secondary | ICD-10-CM | POA: Diagnosis present

## 2020-12-25 DIAGNOSIS — I5041 Acute combined systolic (congestive) and diastolic (congestive) heart failure: Secondary | ICD-10-CM | POA: Diagnosis not present

## 2020-12-25 DIAGNOSIS — N9489 Other specified conditions associated with female genital organs and menstrual cycle: Secondary | ICD-10-CM | POA: Insufficient documentation

## 2020-12-25 DIAGNOSIS — Z7982 Long term (current) use of aspirin: Secondary | ICD-10-CM | POA: Diagnosis not present

## 2020-12-25 HISTORY — DX: Heart failure, unspecified: I50.9

## 2020-12-25 LAB — CBC
HCT: 34.7 % — ABNORMAL LOW (ref 36.0–46.0)
Hemoglobin: 12.4 g/dL (ref 12.0–15.0)
MCH: 31.8 pg (ref 26.0–34.0)
MCHC: 35.7 g/dL (ref 30.0–36.0)
MCV: 89 fL (ref 80.0–100.0)
Platelets: 236 10*3/uL (ref 150–400)
RBC: 3.9 MIL/uL (ref 3.87–5.11)
RDW: 16.6 % — ABNORMAL HIGH (ref 11.5–15.5)
WBC: 5.5 10*3/uL (ref 4.0–10.5)
nRBC: 0 % (ref 0.0–0.2)

## 2020-12-25 LAB — BASIC METABOLIC PANEL
Anion gap: 11 (ref 5–15)
Anion gap: 9 (ref 5–15)
BUN: 49 mg/dL — ABNORMAL HIGH (ref 6–20)
BUN: 47 mg/dL — ABNORMAL HIGH (ref 6–20)
CO2: 16 mmol/L — ABNORMAL LOW (ref 22–32)
CO2: 18 mmol/L — ABNORMAL LOW (ref 22–32)
Calcium: 9 mg/dL (ref 8.9–10.3)
Calcium: 9.1 mg/dL (ref 8.9–10.3)
Chloride: 108 mmol/L (ref 98–111)
Chloride: 107 mmol/L (ref 98–111)
Creatinine, Ser: 1.59 mg/dL — ABNORMAL HIGH (ref 0.44–1.00)
Creatinine, Ser: 1.46 mg/dL — ABNORMAL HIGH (ref 0.44–1.00)
GFR, Estimated: 45 mL/min — ABNORMAL LOW (ref >60)
GFR, Estimated: 50 mL/min — ABNORMAL LOW (ref >60)
Glucose, Bld: 195 mg/dL — ABNORMAL HIGH (ref 70–99)
Glucose, Bld: 139 mg/dL — ABNORMAL HIGH (ref 70–99)
Potassium: 6.6 mmol/L (ref 3.5–5.1)
Potassium: 5.7 mmol/L — ABNORMAL HIGH (ref 3.5–5.1)
Sodium: 135 mmol/L (ref 135–145)
Sodium: 134 mmol/L — ABNORMAL LOW (ref 135–145)

## 2020-12-25 LAB — URINALYSIS, ROUTINE W REFLEX MICROSCOPIC
Bilirubin Urine: NEGATIVE
Glucose, UA: 50 mg/dL — AB
Hgb urine dipstick: NEGATIVE
Ketones, ur: NEGATIVE mg/dL
Nitrite: NEGATIVE
Protein, ur: NEGATIVE mg/dL
Specific Gravity, Urine: 1.012 (ref 1.005–1.030)
pH: 5 (ref 5.0–8.0)

## 2020-12-25 LAB — DIGOXIN LEVEL: Digoxin Level: <0.2 ng/mL — ABNORMAL LOW (ref 0.8–2.0)

## 2020-12-25 LAB — TROPONIN I (HIGH SENSITIVITY)
Troponin I (High Sensitivity): 12 ng/L (ref <18)
Troponin I (High Sensitivity): 12 ng/L (ref <18)

## 2020-12-25 LAB — MAGNESIUM: Magnesium: 2.3 mg/dL (ref 1.7–2.4)

## 2020-12-25 LAB — BRAIN NATRIURETIC PEPTIDE: B Natriuretic Peptide: 42.9 pg/mL (ref 0.0–100.0)

## 2020-12-25 LAB — I-STAT BETA HCG BLOOD, ED (MC, WL, AP ONLY): I-stat hCG, quantitative: <5 m[IU]/mL (ref <5)

## 2020-12-25 MED ORDER — OXYCODONE-ACETAMINOPHEN 5-325 MG PO TABS
1.0000 | ORAL_TABLET | ORAL | Status: DC | PRN
  Administered 2020-12-25: 1 via ORAL
  Filled 2020-12-25: qty 1

## 2020-12-25 MED ORDER — SODIUM ZIRCONIUM CYCLOSILICATE 10 G PO PACK
10.0000 g | PACK | Freq: Once | ORAL | Status: AC
  Administered 2020-12-25: 10 g via ORAL
  Filled 2020-12-25: qty 1

## 2020-12-25 NOTE — ED Triage Notes (Addendum)
Pt here POV from work.Pt became dizzy and fell. Cardiac history. Wearing defibrillator life vest. Pt denies unit firing. Wound noted on back of head. Bleeding controlled by EMS. Not on thinners. NIH O  184/86 HR 86 RR 2 CBG 169

## 2020-12-25 NOTE — ED Notes (Signed)
Pt wearing cardiac life vest. Pt reports it was placed Saturday as a precaution d/t decreased EF. Pt denies any firing of the vest since it was placed on pt.

## 2020-12-25 NOTE — ED Notes (Signed)
Cleaned patient head up with some saline

## 2020-12-25 NOTE — ED Provider Notes (Signed)
[facility] EMERGENCY DEPARTMENT Provider Note   CSN: [id number] Arrival date & time: 12/25/20  1157     History Chief Complaint  Patient presents with   Dizziness   Fall    Jennifer [name] is a 30 y.o. female.   Dizziness Associated symptoms: headaches   Associated symptoms: no chest pain, no nausea, no palpitations, no shortness of breath, no vomiting and no weakness   Fall Associated symptoms include headaches. Pertinent negatives include no chest pain, no abdominal pain and no shortness of breath. Patient presents from work after a fall.  Prior to the fall, she felt dizzy.  [name] currently wearing a defibrillator LifeVest.  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  Had a recent hospitalization for abdominal pain and diarrhea.  During this hospitalization, cardiac studies were obtained.  Cardiac MRI was consistent with prior myocarditis versus sarcoid.  A moderate pericardial effusion was noted.  She underwent a right and left heart cath which showed a single-vessel RCA occlusion.  She was on milrinone but was weaned off of inotropy.  Since her hospitalization, she has had intermittent chest pains.  [name] taking Entresto, Coreg, spironolactone, digoxin.     Past Medical History:  Diagnosis Date   Astrocytoma ([place])    Brain tumor ([place]) 03/29/1995   CHF (congestive heart failure) ([place])    Cholesterosis    Diabetes mellitus without complication ([place])    DM (diabetes mellitus) ([place]) 10/10/2018   Fatty liver    HTN (hypertension) 10/10/2018   Hypertension    Hypothyroidism 10/10/2018   Hypothyroidism    Lipoprotein deficiency    Lung disease    longevity long term   Pancreatitis    Polycystic ovary syndrome    Thyroid disease     Patient Active Problem List   Diagnosis Date Noted   Diarrhea    Shock liver     Hypotension 11/28/2020   Acute combined systolic and diastolic heart failure (HCC)    Nonischemic cardiomyopathy (HCC)    Acute decompensated heart failure (HCC)    Elevated liver enzymes    Cardiogenic shock ([place])    AKI (acute kidney injury) ([place]) 11/26/2020   Abdominal pain 26/20/3559   Chronic systolic CHF (congestive heart failure) ([place]) 11/23/2020   Essential hypertension 11/23/2020   Hypertriglyceridemia 11/23/2020   Prolonged QT interval 11/23/2020   DM (diabetes mellitus) ([place]) 10/10/2018   Hypothyroidism 10/10/2018    Past Surgical History:  Procedure Laterality Date   ABDOMINAL SURGERY     pt states during miscarriage got her intestine   BRAIN SURGERY     EYE MUSCLE SURGERY Right 03/28/2014   RIGHT/LEFT HEART CATH AND CORONARY ANGIOGRAPHY N/A 12/03/2020   Procedure: RIGHT/LEFT HEART CATH AND CORONARY ANGIOGRAPHY;  Surgeon: [name], MD;  Location: [place] [facility];  Service: Cardiovascular;  Laterality: N/A;   VENTRICULOSTOMY  03/28/1997     OB History   No obstetric history on file.     Family History  Problem Relation Age of Onset   Diabetes Mother    Hypertension Mother    Hyperlipidemia Mother    Thyroid disease Mother    Hypertension Father    Diabetes Father     Social History   Tobacco Use   Smoking status: Never   Smokeless tobacco: Never  Vaping Use   Vaping Use: Never used  Substance Use Topics   Alcohol use: Never   Drug use: Never    Home Medications Prior to Admission medications   Medication Sig Start Date End Date Taking? Authorizing Provider  Acetylcarnitine HCl (ACETYL L-CARNITINE) 500 MG CAPS Take 500 mg by mouth every morning.   Yes [provider]  albuterol (VENTOLIN HFA) 108 (90 Base) MCG/ACT inhaler Inhale 2 puffs into the lungs every 6 (six) hours as needed for wheezing or shortness of breath. 12/31/18  Yes [provider]  aspirin EC 81 MG EC tablet Take 1 tablet (81 mg total) by mouth daily. Swallow  whole. 12/07/20  Yes Nicole Kindred A, DO  cholecalciferol (VITAMIN D) 25 MCG tablet Take 1 tablet (1,000 Units total) by mouth daily. 12/07/20  Yes Nicole Kindred A, DO  Coenzyme Q10 (COQ10) 30 MG CAPS Take 30 mg by mouth every morning.   Yes [provider]  CRANBERRY FRUIT PO Take 1 capsule by mouth 2 (two) times a day.   Yes [provider]  Cyanocobalamin (VITAMIN B12 PO) Take 1 drop by mouth every morning. liquid   Yes [provider]  cyclobenzaprine (FLEXERIL) 10 MG tablet Take 10 mg by mouth daily as needed for muscle spasms. 11/19/18  Yes [provider]  digoxin (LANOXIN) 0.125 MG tablet Take 1 tablet (0.125 mg total) by mouth daily. 12/07/20  Yes Nicole Kindred A, DO  DULoxetine (CYMBALTA) 30 MG capsule Take 30 mg by mouth at bedtime. 01/28/19 12/16/21 Yes [provider]  Evolocumab (REPATHA) 140 MG/ML SOSY Inject 140 mg into the skin See admin instructions. Inject 140 mg subcutaneously every other Sunday evening   Yes [provider]  fenofibrate 160 MG tablet Take 1 tablet (160 mg total) by mouth daily. 12/07/20  Yes Nicole Kindred A, DO  fluticasone furoate-vilanterol (BREO ELLIPTA) 100-25 MCG/INH AEPB Inhale 1 puff into the lungs daily as needed (shortness of breath). 01/21/19  Yes [provider]  furosemide (LASIX) 20 MG tablet Take 1 tablet (20 mg total) by mouth daily as needed (Edema/Swelling). 12/06/20  Yes Nicole Kindred A, DO  insulin glargine, 1 Unit Dial, (TOUJEO SOLOSTAR) 300 UNIT/ML Solostar Pen Inject 35 Units into the skin in the morning and at bedtime. Patient taking differently: Inject 95 Units into the skin in the morning and at bedtime. 12/06/20  Yes Ezekiel Slocumb, DO  ivabradine (CORLANOR) 5 MG TABS tablet Take 1 tablet (5 mg total) by mouth 2 (two) times daily with a meal. 12/06/20  Yes Ezekiel Slocumb, DO  levothyroxine (SYNTHROID) 125 MCG tablet Take 1 tablet (125 mcg total) by mouth daily at 6 (six)  AM. 12/07/20  Yes Ezekiel Slocumb, DO  lipase/protease/amylase (CREON) 36000 UNITS CPEP capsule Take 1 capsule (36,000 Units total) by mouth 3 (three) times daily with meals. 12/06/20  Yes Nicole Kindred A, DO  metoprolol (TOPROL-XL) 200 MG 24 hr tablet Take 200 mg by mouth at bedtime.   Yes [provider]  metoprolol succinate (TOPROL-XL) 25 MG 24 hr tablet Take 25 mg by mouth every morning. 12/01/20  Yes [provider]  Multiple Vitamin (MULTIVITAMIN WITH MINERALS) TABS tablet Take 1 tablet by mouth daily. 12/07/20  Yes Ezekiel Slocumb, DO  norethindrone (MICRONOR) 0.35 MG tablet Take 1 tablet by mouth every morning.   Yes [provider]  NOVOLOG FLEXPEN 100 UNIT/ML FlexPen Inject 10-25 Units into the skin 3 (three) times daily. With meals.  Take 10 units PLUS 0-15 additional units per  sliding scale instructions. Patient taking differently: Inject 30 Units into the skin 3 (three) times daily before meals. 12/06/20  Yes Nicole Kindred A, DO  ondansetron (ZOFRAN) 4 MG tablet Take 4 mg by mouth every 8 (eight) hours as needed for nausea or vomiting. 12/19/18  Yes [provider]  pantoprazole (PROTONIX) 40 MG tablet Take 40 mg by mouth at bedtime. 12/24/19  Yes [provider]  pregabalin (LYRICA) 100 MG capsule Take 300 mg by mouth every morning. 10/09/20  Yes [provider]  pregabalin (LYRICA) 300 MG capsule Take 300 mg by mouth at bedtime.   Yes [provider]  prochlorperazine (COMPAZINE) 10 MG tablet Take 1 tablet (10 mg total) by mouth 2 (two) times daily as needed for nausea or vomiting. 04/18/20  Yes Ray, Andee Poles, MD  sacubitril-valsartan (ENTRESTO) 49-51 MG Take 1 tablet by mouth 2 (two) times daily.   Yes [provider]  spironolactone (ALDACTONE) 25 MG tablet Take 0.5 tablets (12.5 mg total) by mouth daily. 12/07/20  Yes Nicole Kindred A, DO  carvedilol (COREG) 3.125 MG tablet Take 1 tablet (3.125 mg total) by mouth  2 (two) times daily with a meal. Patient not taking: No sig reported 12/06/20   Ezekiel Slocumb, DO  empagliflozin (JARDIANCE) 10 MG TABS tablet Take 10 mg by mouth daily. Patient not taking: No sig reported    [provider]  nitroGLYCERIN (NITROSTAT) 0.4 MG SL tablet Place 1 tablet (0.4 mg total) under the tongue every 5 (five) minutes as needed for up to 25 days for chest pain. Patient not taking: No sig reported 08/01/19 08/26/19  [name], MD  sulfamethoxazole-trimethoprim (BACTRIM DS) 800-160 MG tablet Take 1 tablet by mouth 2 (two) times daily. Patient not taking: Reported on 12/25/2020    [provider]    Allergies    Ketamine, Clarithromycin, Shellfish allergy, Penicillins, and Prednisone  Review of Systems   Review of Systems  Constitutional:  Negative for chills, fatigue and fever.  HENT:  Negative for ear pain and sore throat.   Eyes:  Negative for pain and visual disturbance.  Respiratory:  Negative for cough, chest tightness and shortness of breath.   Cardiovascular:  Negative for chest pain and palpitations.  Gastrointestinal:  Negative for abdominal pain, nausea and vomiting.  Genitourinary:  Negative for dysuria, flank pain and hematuria.  Musculoskeletal:  Negative for arthralgias, back pain, gait problem, joint swelling, myalgias and neck pain.  Skin:  Positive for wound. Negative for color change and rash.  Neurological:  Positive for dizziness and headaches. Negative for seizures, syncope, facial asymmetry, speech difficulty, weakness and numbness.  Hematological:  Does not bruise/bleed easily.  Psychiatric/Behavioral:  Negative for confusion and decreased concentration.   All other systems reviewed and are negative.  Physical Exam Updated Vital Signs BP 119/83   Pulse 81   Temp 97.9 F (36.6 C) (Temporal)   Resp (!) 22   SpO2 99%   Physical Exam Vitals and nursing note reviewed.  Constitutional:      General: [name] not in acute  distress.    Appearance: Normal appearance. [name] well-developed and normal weight. [name] not ill-appearing, toxic-appearing or diaphoretic.  HENT:     Head: Normocephalic.     Comments: Swelling and hemostatic wound to left occipital scalp    Right Ear: External ear normal.     Left Ear: External ear normal.     Nose: Nose normal.     Mouth/Throat:  Mouth: Mucous membranes are moist.     Pharynx: Oropharynx is clear.  Eyes:     Extraocular Movements: Extraocular movements intact.     Conjunctiva/sclera: Conjunctivae normal.  Cardiovascular:     Rate and Rhythm: Normal rate and regular rhythm.     Heart sounds: No murmur heard. Pulmonary:     Effort: Pulmonary effort is normal. No respiratory distress.     Breath sounds: Normal breath sounds. No wheezing.  Chest:     Chest wall: No tenderness.  Abdominal:     Palpations: Abdomen is soft.     Tenderness: There is no abdominal tenderness. There is no right CVA tenderness or left CVA tenderness.  Musculoskeletal:        General: No tenderness, deformity or signs of injury. Normal range of motion.     Cervical back: Normal range of motion and neck supple. No rigidity or tenderness.     Right lower leg: No edema.     Left lower leg: No edema.  Skin:    General: Skin is warm and dry.     Capillary Refill: Capillary refill takes less than 2 seconds.     Coloration: Skin is not jaundiced or pale.  Neurological:     General: No focal deficit present.     Mental Status: [name] alert and oriented to person, place, and time.     Cranial Nerves: No cranial nerve deficit.     Sensory: No sensory deficit.     Motor: No weakness.     Coordination: Coordination normal.     Gait: Gait normal.  Psychiatric:        Mood and Affect: Mood normal.        Behavior: Behavior normal.        Thought Content: Thought content normal.        Judgment: Judgment normal.    ED Results / Procedures / Treatments   Labs (all labs ordered are  listed, but only abnormal results are displayed) Labs Reviewed  BASIC METABOLIC PANEL - Abnormal; Notable for the following components:      Result Value   Potassium 6.6 (*)    CO2 16 (*)    Glucose, Bld 195 (*)    BUN 49 (*)    Creatinine, Ser 1.59 (*)    GFR, Estimated 45 (*)    All other components within normal limits  CBC - Abnormal; Notable for the following components:   HCT 34.7 (*)    RDW 16.6 (*)    All other components within normal limits  URINALYSIS, ROUTINE W REFLEX MICROSCOPIC - Abnormal; Notable for the following components:   APPearance HAZY (*)    Glucose, UA 50 (*)    Leukocytes,Ua TRACE (*)    Bacteria, UA RARE (*)    All other components within normal limits  DIGOXIN LEVEL - Abnormal; Notable for the following components:   Digoxin Level <0.2 (*)    All other components within normal limits  BASIC METABOLIC PANEL - Abnormal; Notable for the following components:   Sodium 134 (*)    Potassium 5.7 (*)    CO2 18 (*)    Glucose, Bld 139 (*)    BUN 47 (*)    Creatinine, Ser 1.46 (*)    GFR, Estimated 50 (*)    All other components within normal limits  BRAIN NATRIURETIC PEPTIDE  MAGNESIUM  I-STAT BETA HCG BLOOD, ED ([facility], [facility], [facility] ONLY)  TROPONIN I (HIGH SENSITIVITY)  TROPONIN  I (HIGH SENSITIVITY)    EKG EKG Interpretation  Date/Time:  Friday December 25 2020 08:41:11 EDT Ventricular Rate:  88 PR Interval:  118 QRS Duration: 90 QT Interval:  376 QTC Calculation: 454 R Axis:   -6 Text Interpretation: Normal sinus rhythm Minimal voltage criteria for LVH, may be normal variant ( R in aVL ) Nonspecific ST abnormality Abnormal ECG Confirmed by Godfrey Pick ([id number]) on 12/25/2020 10:49:17 AM  Radiology CT HEAD WO CONTRAST (5MM)  Result Date: 12/25/2020 CLINICAL DATA:  Fall EXAM: CT HEAD WITHOUT CONTRAST TECHNIQUE: Contiguous axial images were obtained from the base of the skull through the vertex without intravenous contrast. COMPARISON:  MRI brain 2020  FINDINGS: Brain: There is no acute intracranial hemorrhage. No acute appearing loss of gray-white differentiation. Hyperdense lesion of the right midbrain corresponds to abnormality on prior MRI. This is not well evaluated on this study. Ventricles and sulci are similar in size and configuration. Prior catheter tract is seen in the right frontal lobe. Vascular: No hyperdense vessel or unexpected calcification. Skull: Right craniotomy. Sinuses/Orbits: No acute finding. Other: Left posterior scalp hematoma. IMPRESSION: No evidence of acute intracranial injury. Right midbrain lesion present on prior MRI is not well evaluated. Electronically Signed   By: Macy Mis M.D.   On: 12/25/2020 12:06   CT Cervical Spine Wo Contrast  Result Date: 12/25/2020 CLINICAL DATA:  [place] today. Posterior neck pain. EXAM: CT CERVICAL SPINE WITHOUT CONTRAST TECHNIQUE: Multidetector CT imaging of the cervical spine was performed without intravenous contrast. Multiplanar CT image reconstructions were also generated. COMPARISON:  CT scan 10/19/2018 FINDINGS: Alignment: The cervical vertebral bodies are normally aligned. There is reversal of the normal cervical lordosis likely due to the head forward position. Skull base and vertebrae: No acute fracture. No primary bone lesion or focal pathologic process. Soft tissues and spinal canal: No prevertebral fluid or swelling. No visible canal hematoma. Disc levels: The spinal canal is quite generous. No large disc protrusions, spinal or foraminal stenosis. Upper chest: The lung apices are grossly clear. Other: No neck mass or adenopathy or hematoma. IMPRESSION: 1. Normal alignment of the cervical vertebral bodies and no acute fracture. 2. Reversal of the normal cervical lordosis likely due to the head forward position. 3. No large disc protrusions, spinal or foraminal stenosis. Electronically Signed   By: Marijo Sanes M.D.   On: 12/25/2020 12:38    Procedures .Marland KitchenLaceration  Repair  Date/Time: 12/26/2020 7:22 AM Performed by: Godfrey Pick, MD Authorized by: Godfrey Pick, MD   Consent:    Consent obtained:  Verbal   Consent given by:  Patient   Risks, benefits, and alternatives were discussed: yes     Risks discussed:  Infection   Alternatives discussed:  No treatment Universal protocol:    Procedure explained and questions answered to patient or proxy's satisfaction: yes     Test results available: yes     Imaging studies available: yes     Patient identity confirmed:  Verbally with patient Anesthesia:    Anesthesia method:  None Laceration details:    Location:  Scalp   Scalp location:  Occipital Pre-procedure details:    Preparation:  Imaging obtained to evaluate for foreign bodies Exploration:    Hemostasis achieved with:  Direct pressure   Imaging obtained comment:  CT head   Imaging outcome: foreign body not noted     Contaminated: no   Treatment:    Area cleansed with:  Saline   Amount of cleaning:  Standard  Irrigation solution:  Sterile saline   Visualized foreign bodies/material removed: no   Skin repair:    Repair method: dermabond. Post-procedure details:    Procedure completion:  Tolerated well, no immediate complications   Medications Ordered in ED Medications  sodium zirconium cyclosilicate (LOKELMA) packet 10 g (10 g Oral Given 12/25/20 1518)    ED Course  I have reviewed the triage vital signs and the nursing notes.  Pertinent labs & imaging results that were available during my care of the patient were reviewed by me and considered in my medical decision making (see chart for details).    MDM Rules/Calculators/A&P                           Patient is a 30 year old female with an extensive medical history and a recent hospitalization who presents after a fall.  Patient was at work standing when she became dizzy.  This caused her to have a mechanical fall in which she fell backwards, striking the back of her head against  a tile floor.  She had some bleeding on the scene.  She did not lose consciousness.  She has since had a generalized headache.  Patient has a history of brain tumor removal and is concerned about intracranial injury.  [name] currently not on any blood thinning medication.  Medical history is also notable for heart failure.  She states that she often gets dizzy when going from sitting to standing.  She denies any current dizziness.  She also denies any associated chest pain, shortness of breath, nausea, diaphoresis.  EKG on arrival is reassuring.  Laboratory work-up was initiated.  CT scan of head and cervical spine were ordered.  Patient declines any analgesia at this time.  CT scan of head and cervical spine were negative for acute injury.  Patient's initial lab work showed hyperkalemia with hemolysis.  Repeat labs were ordered.  Repeat labs showed a potassium of 5.7.  Patient states that she has had ongoing elevated potassium that was identified during her recent hospitalization.  She was agreeable to dose of Lokelma in the ED and close follow-up.  Patient states that she has multiple follow-up appointments scheduled for her ongoing chronic medical conditions.  In regards to her head wound, area was thoroughly cleansed.  Wound consists of macerated skin.  Patient was offered closure with Dermabond versus coverage with gauze for healing by secondary intention.  Patient elected for Dermabond.  This was performed at bedside.  Patient was discharged in stable condition.  Final Clinical Impression(s) / ED Diagnoses Final diagnoses:  Fall, initial encounter    Rx / DC Orders ED Discharge Orders     None        Godfrey Pick, MD 12/26/20 [phone]

## 2020-12-25 NOTE — ED Notes (Signed)
Patient transported to CT 

## 2020-12-25 NOTE — ED Notes (Signed)
Got patient into a gown on the monitor patient is resting with family at bedside

## 2020-12-29 ENCOUNTER — Encounter (HOSPITAL_COMMUNITY): Payer: Self-pay

## 2020-12-29 LAB — BASIC METABOLIC PANEL
BUN/Creatinine Ratio: 36 — ABNORMAL HIGH (ref 9–23)
BUN: 34 mg/dL — ABNORMAL HIGH (ref 6–20)
CO2: 16 mmol/L — ABNORMAL LOW (ref 20–29)
Calcium: 9.1 mg/dL (ref 8.7–10.2)
Chloride: 91 mmol/L — ABNORMAL LOW (ref 96–106)
Creatinine, Ser: 0.95 mg/dL (ref 0.57–1.00)
Glucose: 200 mg/dL — ABNORMAL HIGH (ref 70–99)
Potassium: 4.7 mmol/L (ref 3.5–5.2)
Sodium: 127 mmol/L — ABNORMAL LOW (ref 134–144)
eGFR: 83 mL/min/{1.73_m2} (ref >59)

## 2021-01-01 ENCOUNTER — Other Ambulatory Visit (HOSPITAL_COMMUNITY): Payer: Self-pay

## 2021-01-01 NOTE — Telephone Encounter (Signed)
Advanced Heart Failure Patient Advocate Encounter  Prior Authorization for [place] has been approved.    Effective dates: 12/07/20 through 12/06/21  Patients co-pay is $0  Charlann Boxer, CPhT

## 2021-01-05 ENCOUNTER — Encounter (HOSPITAL_COMMUNITY): Payer: Self-pay | Admitting: *Deleted

## 2021-01-05 NOTE — Progress Notes (Signed)
Advanced Heart Failure Clinic Note    PCP: Sue Lush, PA-C PCP-Cardiologist: Dr. Einar Gip HF Cardiologist: Dr. Haroldine Laws  HPI: Jennifer [name] is a 30 y.o. 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   Has been followed by Dr. Einar Gip for her HF.    Admitted 11/22/20 with ab pain and diarrhea. Ab CT concerning for vasculitis (no pancreatitis).Her TG were found to be  4030. She was started on an insulin/dextrose infusion. Cardiology and Nephrology were consulted after her creatinine and LFTs continued to rise and she began developing hypotension. Echo 11/28/20 EF was found to be 20-25% with moderate RV dysfunction and mild septal flattening. Started on DBA 2.5. AHF consulted for further management. PICC placed to follow CVP and Co-Ox. BP were high and milrinone added to augment diuresis. cMRI c/w prior myocarditis vs sarcoid (see below). Mod pericardial effusion also noted. LVEF 29%. RV normal. She underwent R/LHC showing single vessel RCA occlusion, RA 1, LVEDP 17, Fick CO 5.6/CI 3.88. She was able to be weaned of inotropes and GDMT titrated.    Post hospital follow up she was drinking >2L/day fluid and eating more salty foods, volume was up, ReDs 43%, instructed to increase lasix to 20 mg bid x 3 days. She followed up with [name] with [facility] 12/17/20 for a 2nd opinion. He placed LifeVest and switched beta blocker to Toprol XL.  Called after hours service with swelling, instructed to continue lasix 20 mg bid.  Seen in ED 12/25/20 for fall. She was standing at work and became dizzy. She fell backwards and hit her head. CT neg, labs showed K 5.7. She was given 1 dose of Lokelma. She denied palpitations.  Today she returns for HF follow up with her husband. She has remained on increased lasix dose but her legs  and abdomen remain swollen. [name] SOB with increased physical activity. No more syncope. Denies CP, dizziness, or PND/Orthopnea. Appetite ok. No fever or chills. Weight at home 127 pounds.  She works full time at a preschool. She has been out of lasix for the past couple of days. She has been taking carvedilol and Toprol.   Cardiac Studies: - [facility] 12/03/20:  Normal right heart catheterization pressures.   RA 4/1, mean 1;  RV 23/4  EDP 6  PA 22/8, mean 11 PA saturation 76%.   PW 9/7, mean 7 mmHg.  CO 5.6, CI 3.88.   QP/QS 1.00.  LV: 136/9 EDP 17 mmHg.   Ao 131/86, mean 105 mmHg.  No pressure gradient across the aortic valve.  RCA: A very small but dominant RCA which is diffusely diseased around 80%.  Mid segment 99% occlusion.  Conus branch collateralizes distal RCA. LM: Large, smooth and normal. LAD: Large vessel, minimal diffuse disease.  Small to moderate-sized D1 and D2. CX: Large vessel.  Minimal diffuse disease.  Gives origin to large OM1 and OM 2.   Impression: Nonischemic cardiomyopathy, single vessel occlusion does not explain her markedly reduced LVEF.  Medical therapy for CAD and nonischemic cardiomyopathy.    - cMRI (12/01/20): LVEF 01% severe systolic dysfunction, normal RV size and systolic function, RV EF 02%, moderate pericardial effusion, midwall LGE basal to mid ant/anterolateral walls (myocarditis vs sarcoidosis; rec cardiac PET to evaluate for sarcoid).  - Echo (1/21): EF 40%. Mod MR/TR (Dr. Einar Gip) - Lexiscan (2/21): EF 38% Normal perfusion -  Echo (1/22): EF 35-40% Mod MR  - cMRI  08/04/2017 [facility]): LVEF 42% mod decreased, mild decrease RV function, findings suggestive of myocarditis and infarction, small circumferential pericardial effusion.  Past Medical History:  Diagnosis Date   Astrocytoma ([place])    Brain tumor ([place]) 03/29/1995   CHF (congestive heart failure) (H[facility])    Cholesterosis    Diabetes mellitus without complication ([place])    DM (diabetes mellitus)  ([place]) 10/10/2018   Fatty liver    HTN (hypertension) 10/10/2018   Hypertension    Hypothyroidism 10/10/2018   Hypothyroidism    Lipoprotein deficiency    Lung disease    longevity long term   Pancreatitis    Polycystic ovary syndrome    Thyroid disease    Current Outpatient Medications  Medication Sig Dispense Refill   Acetylcarnitine HCl (ACETYL L-CARNITINE) 500 MG CAPS Take 500 mg by mouth every morning.     albuterol (VENTOLIN HFA) 108 (90 Base) MCG/ACT inhaler Inhale 2 puffs into the lungs every 6 (six) hours as needed for wheezing or shortness of breath.     aspirin EC 81 MG EC tablet Take 1 tablet (81 mg total) by mouth daily. Swallow whole. 30 tablet 1   cholecalciferol (VITAMIN D) 25 MCG tablet Take 1 tablet (1,000 Units total) by mouth daily. 30 tablet 1   Coenzyme Q10 (COQ10) 30 MG CAPS Take 30 mg by mouth every morning.     CRANBERRY FRUIT PO Take 1 capsule by mouth 2 (two) times a day.     Cyanocobalamin (VITAMIN B12 PO) Take 1 drop by mouth every morning. liquid     cyclobenzaprine (FLEXERIL) 10 MG tablet Take 10 mg by mouth daily as needed for muscle spasms.     digoxin (LANOXIN) 0.125 MG tablet Take 1 tablet (0.125 mg total) by mouth daily. 30 tablet 1   DULoxetine (CYMBALTA) 30 MG capsule Take 30 mg by mouth at bedtime.     empagliflozin (JARDIANCE) 10 MG TABS tablet Take 10 mg by mouth daily.     Evolocumab (REPATHA) 140 MG/ML SOSY Inject 140 mg into the skin See admin instructions. Inject 140 mg subcutaneously every other Sunday evening     fenofibrate 160 MG tablet Take 1 tablet (160 mg total) by mouth daily. 30 tablet 1   fluticasone furoate-vilanterol (BREO ELLIPTA) 100-25 MCG/INH AEPB Inhale 1 puff into the lungs daily as needed (shortness of breath).     insulin glargine, 1 Unit Dial, (TOUJEO SOLOSTAR) 300 UNIT/ML Solostar Pen Inject 35 Units into the skin in the morning and at bedtime. (Patient taking differently: Inject 90 Units into the skin in the morning and  at bedtime.)     ivabradine (CORLANOR) 5 MG TABS tablet Take 1 tablet (5 mg total) by mouth 2 (two) times daily with a meal. 60 tablet 1   levothyroxine (SYNTHROID) 125 MCG tablet Take 1 tablet (125 mcg total) by mouth daily at 6 (six) AM. 30 tablet 1   lipase/protease/amylase (CREON) 36000 UNITS CPEP capsule Take 1 capsule (36,000 Units total) by mouth 3 (three) times daily with meals. 270 capsule 0   metoprolol (TOPROL-XL) 200 MG 24 hr tablet Take 200 mg by mouth at bedtime.     metoprolol succinate (TOPROL-XL) 25 MG 24 hr tablet Take 25 mg by mouth every morning.     Multiple Vitamin (MULTIVITAMIN WITH MINERALS) TABS tablet Take 1 tablet by mouth daily. 30 tablet 1   nitroGLYCERIN (NITROSTAT) 0.4 MG SL tablet Place 1 tablet (0.4  mg total) under the tongue every 5 (five) minutes as needed for up to 25 days for chest pain. 25 tablet 3   norethindrone (MICRONOR) 0.35 MG tablet Take 1 tablet by mouth every morning.     NOVOLOG FLEXPEN 100 UNIT/ML FlexPen Inject 10-25 Units into the skin 3 (three) times daily. With meals.  Take 10 units PLUS 0-15 additional units per sliding scale instructions. (Patient taking differently: Inject into the skin 3 (three) times daily before meals.) 15 mL 11   ondansetron (ZOFRAN) 4 MG tablet Take 4 mg by mouth every 8 (eight) hours as needed for nausea or vomiting.     pantoprazole (PROTONIX) 40 MG tablet Take 40 mg by mouth at bedtime.     pregabalin (LYRICA) 100 MG capsule Take 300 mg by mouth every morning.     pregabalin (LYRICA) 300 MG capsule Take 300 mg by mouth at bedtime.     prochlorperazine (COMPAZINE) 10 MG tablet Take 1 tablet (10 mg total) by mouth 2 (two) times daily as needed for nausea or vomiting. 10 tablet 0   sacubitril-valsartan (ENTRESTO) 24-26 MG Take 1 tablet by mouth 2 (two) times daily. 60 tablet 11   spironolactone (ALDACTONE) 25 MG tablet Take 0.5 tablets (12.5 mg total) by mouth daily. 30 tablet 1   torsemide (DEMADEX) 20 MG tablet Take 2  tablets (40 mg total) by mouth daily. 60 tablet 3   No current facility-administered medications for this encounter.   Allergies  Allergen Reactions   Ketamine Other (See Comments)    In vegetative state for 15 minutes per pt   Clarithromycin Other (See Comments)    Stomach pain    Shellfish Allergy Swelling    Throat swelling. Pt states contrast in CT is okay   Penicillins Rash    Has patient had a PCN reaction causing immediate rash, facial/tongue/throat swelling, SOB or lightheadedness with hypotension: Y Has patient had a PCN reaction causing severe rash involving mucus membranes or skin necrosis: Y Has patient had a PCN reaction that required hospitalization: N Has patient had a PCN reaction occurring within the last 10 years: Y If all of the above answers are "NO", then may proceed with Cephalosporin use.    Prednisone Rash   Social History   Socioeconomic History   Marital status: Married    Spouse name: Not on file   Number of children: 0   Years of education: Not on file   Highest education level: Not on file  Occupational History   Not on file  Tobacco Use   Smoking status: Never   Smokeless tobacco: Never  Vaping Use   Vaping Use: Never used  Substance and Sexual Activity   Alcohol use: Never   Drug use: Never   Sexual activity: Not on file  Other Topics Concern   Not on file  Social History Narrative   Not on file   Social Determinants of Health   Financial Resource Strain: Low Risk    Difficulty of Paying Living Expenses: Not very hard  Food Insecurity: No Food Insecurity   Worried About Running Out of Food in the Last Year: Never true   [place] in the Last Year: Never true  Transportation Needs: No Transportation Needs   Lack of Transportation (Medical): No   Lack of Transportation (Non-Medical): No  Physical Activity: Not on file  Stress: Not on file  Social Connections: Not on file  Intimate Partner Violence: Not on file  Family  History  Problem Relation Age of Onset   Diabetes Mother    Hypertension Mother    Hyperlipidemia Mother    Thyroid disease Mother    Hypertension Father    Diabetes Father    BP (!) 142/70   Pulse 98   Wt 61 kg (134 lb 6.4 oz)   SpO2 98%   BMI 27.15 kg/m   Wt Readings from Last 3 Encounters:  01/06/21 61 kg (134 lb 6.4 oz)  12/16/20 54.4 kg (120 lb)  12/13/20 52.6 kg (116 lb)   PHYSICAL EXAM: General:  NAD. No resp difficulty HEENT: Right eye ptosis Neck: Supple. No JVD. Carotids 2+ bilat; no bruits. No lymphadenopathy or thryomegaly appreciated. Cor: PMI nondisplaced. Regular rate & rhythm. No rubs, gallops or murmurs. Lungs: Clear, LifeVest in place. Abdomen: Soft, nontender, nondistended. No hepatosplenomegaly. No bruits or masses. Good bowel sounds. Extremities: No cyanosis, clubbing, rash, 2+ BLE to thighs Neuro: Alert & oriented x 3, cranial nerves grossly intact. Moves all 4 extremities w/o difficulty. Affect pleasant, flaccid LUE  ReDs: 42% Life Vest interrogation: No events/treatments, average HR 92, 5342 steps/day.    ASSESSMENT & PLAN: 1. Chronic Systolic Heart Failure - likely NICM - Echo (1/22): EF 35-40% Mod MR - Echo (11/28/20): EF 20-25% with moderate RV dysfunction and mild septal flattening. Mod-severe TR - Rheum serologies negative,  but Rheumatoid factor mildly elevated at 15.5 - cMRI c/w prior myocarditis vs sarcoid. May have delayed chemo cardiotoxcity  - R/LHC with single vessel RCA occlusion, RA 1, LVEDP 17, Fick CO 5.6/CI 3.88 - NYHA III, volume up today, ReDs 42%. - Stop lasix. - Start torsemide 40 mg daily. - Decrease Entresto to 24/26 mg bid with dizziness. - Stop carvedilol.  - Continue Toprol XL 200 mg q hs. - Continue Corlanor 5 mg bid.  - Continue spiro 12.5 mg daily. Will not increase with previous hyperkalemia. - Continue digoxin 0.125 mg daily.  - Continue Jardiance 10 mg daily (started recently by endocrinologist, Dr. Garrel Ridgel at  [facility]). - [name] contracepting with OCPs, will need to avoid pregnancy with teratogenicity of current HF meds. - Discussed limiting her ice and fluids as well as salt consumption. - Suspect she will likely need advanced therapies in the near future. Dr. Haroldine Laws has discussed potential VAD with [facility] transplant team.  - Will arrange for repeat echo in 1-2 weeks and decide on referral to transplant center based on echo. - BMET and dig level today, repeat BMET in 10 days.  2. CAD - Single vessel RCA occlusion (small co-dominant vessel) on cardiac cath 09/08 - Medical management.  - No s/s ischemia.  - Continue aspirin + statin.   3. Severe hyperTG - due to LPL deficiency. - Continue fenofibrate.  - Discussed referral to [facility] for further management. She prefers to continue with her endocrinologist at [facility] who currently manages/follows.   4. Type I DM - Follows w/ endocrinology at [facility].   Follow up in 1-2 weeks with APP to reassess volume.  [place], FNP 01/06/21

## 2021-01-06 ENCOUNTER — Ambulatory Visit (HOSPITAL_COMMUNITY)
Admission: RE | Admit: 2021-01-06 | Discharge: 2021-01-06 | Disposition: A | Discharge status: Home / Self Care | Payer: BC Managed Care – PPO | Source: Ambulatory Visit | Attending: Family Medicine | Admitting: Family Medicine

## 2021-01-06 ENCOUNTER — Other Ambulatory Visit: Payer: Self-pay

## 2021-01-06 ENCOUNTER — Encounter (HOSPITAL_COMMUNITY): Payer: Self-pay

## 2021-01-06 VITALS — BP 142/70 | HR 98 | Wt 134.4 lb

## 2021-01-06 DIAGNOSIS — I428 Other cardiomyopathies: Secondary | ICD-10-CM | POA: Diagnosis not present

## 2021-01-06 DIAGNOSIS — I110 Hypertensive heart disease with heart failure: Secondary | ICD-10-CM | POA: Insufficient documentation

## 2021-01-06 DIAGNOSIS — I5022 Chronic systolic (congestive) heart failure: Secondary | ICD-10-CM | POA: Insufficient documentation

## 2021-01-06 DIAGNOSIS — Z8349 Family history of other endocrine, nutritional and metabolic diseases: Secondary | ICD-10-CM | POA: Insufficient documentation

## 2021-01-06 DIAGNOSIS — Z7984 Long term (current) use of oral hypoglycemic drugs: Secondary | ICD-10-CM | POA: Diagnosis not present

## 2021-01-06 DIAGNOSIS — Z888 Allergy status to other drugs, medicaments and biological substances status: Secondary | ICD-10-CM | POA: Diagnosis not present

## 2021-01-06 DIAGNOSIS — K861 Other chronic pancreatitis: Secondary | ICD-10-CM | POA: Insufficient documentation

## 2021-01-06 DIAGNOSIS — Z7982 Long term (current) use of aspirin: Secondary | ICD-10-CM | POA: Insufficient documentation

## 2021-01-06 DIAGNOSIS — E109 Type 1 diabetes mellitus without complications: Secondary | ICD-10-CM | POA: Diagnosis not present

## 2021-01-06 DIAGNOSIS — Z79890 Hormone replacement therapy (postmenopausal): Secondary | ICD-10-CM | POA: Insufficient documentation

## 2021-01-06 DIAGNOSIS — Z79899 Other long term (current) drug therapy: Secondary | ICD-10-CM | POA: Diagnosis not present

## 2021-01-06 DIAGNOSIS — G8194 Hemiplegia, unspecified affecting left nondominant side: Secondary | ICD-10-CM | POA: Diagnosis not present

## 2021-01-06 DIAGNOSIS — Z880 Allergy status to penicillin: Secondary | ICD-10-CM | POA: Insufficient documentation

## 2021-01-06 DIAGNOSIS — E786 Lipoprotein deficiency: Secondary | ICD-10-CM | POA: Diagnosis not present

## 2021-01-06 DIAGNOSIS — I2510 Atherosclerotic heart disease of native coronary artery without angina pectoris: Secondary | ICD-10-CM | POA: Insufficient documentation

## 2021-01-06 DIAGNOSIS — G09 Sequelae of inflammatory diseases of central nervous system: Secondary | ICD-10-CM | POA: Diagnosis not present

## 2021-01-06 DIAGNOSIS — K760 Fatty (change of) liver, not elsewhere classified: Secondary | ICD-10-CM | POA: Diagnosis not present

## 2021-01-06 DIAGNOSIS — E781 Pure hyperglyceridemia: Secondary | ICD-10-CM | POA: Diagnosis not present

## 2021-01-06 DIAGNOSIS — Z7951 Long term (current) use of inhaled steroids: Secondary | ICD-10-CM | POA: Diagnosis not present

## 2021-01-06 DIAGNOSIS — Z8249 Family history of ischemic heart disease and other diseases of the circulatory system: Secondary | ICD-10-CM | POA: Diagnosis not present

## 2021-01-06 MED ORDER — ENTRESTO 24-26 MG PO TABS: 1.0000 | ORAL_TABLET | Freq: Two times a day (BID) | ORAL | 11 refills | Status: DC

## 2021-01-06 MED ORDER — TORSEMIDE 20 MG PO TABS: 40.0000 mg | ORAL_TABLET | Freq: Every day | ORAL | 3 refills | Status: DC

## 2021-01-06 NOTE — Progress Notes (Signed)
ReDS Vest / Clip - 01/06/21 1500       ReDS Vest / Clip   Station Marker B    Ruler Value 24.5    ReDS Value Range High volume overload    ReDS Actual Value 42

## 2021-01-06 NOTE — Patient Instructions (Signed)
STOP Coreg DECREASE Entresto to 24/26 mg, one tab twice a day STOP Lasix START Torsemide 40 mg, daily  Be sure to only take Toprol XL 259m nightly at bedtime  Labs today We will only contact you if something comes back abnormal or we need to make some changes. Otherwise no news is good news!  Your physician recommends that you schedule a follow-up appointment in: 1-2 weeks  in the Advanced Practitioners (PA/NP) Clinic and echo  Your physician has requested that you have an echocardiogram. Echocardiography is a painless test that uses sound waves to create images of your heart. It provides your doctor with information about the size and shape of your heart and how well your heart's chambers and valves are working. This procedure takes approximately one hour. There are no restrictions for this procedure.  Do the following things EVERYDAY: Weigh yourself in the morning before breakfast. Write it down and keep it in a log. Take your medicines as prescribed Eat low salt foods--Limit salt (sodium) to 2000 mg per day.  Stay as active as you can everyday Limit all fluids for the day to less than 2 liters  [facility] you and your health needs are our priority. As part of our continuing mission to provide you with exceptional heart care, we have created designated Provider Care Teams. These Care Teams include your primary Cardiologist (physician) and Advanced Practice Providers (APPs- Physician Assistants and Nurse Practitioners) who all work together to provide you with the care you need, when you need it.   You may see any of the following providers on your designated Care Team at your next follow up: Dr DGlori BickersDr DLoralie ChampagneDr BPatrice Paradise NP BLyda Jester PUtahJGinnie SmartLAudry Riles PharmD   Please be sure to bring in all your medications bottles to every appointment.

## 2021-01-07 ENCOUNTER — Other Ambulatory Visit (HOSPITAL_COMMUNITY): Payer: Self-pay | Admitting: Family Medicine

## 2021-01-08 LAB — SPECIMEN STATUS REPORT

## 2021-01-08 LAB — BASIC METABOLIC PANEL
BUN/Creatinine Ratio: 24 — ABNORMAL HIGH (ref 9–23)
BUN: 38 mg/dL — ABNORMAL HIGH (ref 6–20)
CO2: 19 mmol/L — ABNORMAL LOW (ref 20–29)
Calcium: 9.5 mg/dL (ref 8.7–10.2)
Chloride: 97 mmol/L (ref 96–106)
Creatinine, Ser: 1.58 mg/dL — ABNORMAL HIGH (ref 0.57–1.00)
Glucose: 108 mg/dL — ABNORMAL HIGH (ref 70–99)
Potassium: 4.7 mmol/L (ref 3.5–5.2)
Sodium: 134 mmol/L (ref 134–144)
eGFR: 45 mL/min/{1.73_m2} — ABNORMAL LOW (ref >59)

## 2021-01-08 LAB — DIGOXIN LEVEL: Digoxin, Serum: 0.5 ng/mL (ref 0.5–0.9)

## 2021-01-11 ENCOUNTER — Other Ambulatory Visit: Payer: Self-pay

## 2021-01-11 ENCOUNTER — Ambulatory Visit (HOSPITAL_COMMUNITY)
Admission: RE | Admit: 2021-01-11 | Discharge: 2021-01-11 | Disposition: A | Discharge status: Home / Self Care | Payer: BC Managed Care – PPO | Source: Ambulatory Visit | Attending: Family Medicine | Admitting: Family Medicine

## 2021-01-11 DIAGNOSIS — I5022 Chronic systolic (congestive) heart failure: Secondary | ICD-10-CM | POA: Diagnosis present

## 2021-01-11 DIAGNOSIS — E119 Type 2 diabetes mellitus without complications: Secondary | ICD-10-CM | POA: Insufficient documentation

## 2021-01-11 DIAGNOSIS — I080 Rheumatic disorders of both mitral and aortic valves: Secondary | ICD-10-CM | POA: Diagnosis not present

## 2021-01-11 DIAGNOSIS — I110 Hypertensive heart disease with heart failure: Secondary | ICD-10-CM | POA: Diagnosis present

## 2021-01-11 LAB — ECHOCARDIOGRAM COMPLETE
Area-P 1/2: 5.38 cm2
Calc EF: 45.8 %
P 1/2 time: 617 ms
S' Lateral: 3 cm
Single Plane A2C EF: 45.3 %
Single Plane A4C EF: 44.7 %

## 2021-01-11 NOTE — Progress Notes (Signed)
  Echocardiogram 2D Echocardiogram has been performed.  Jennifer [name] 01/11/2021, 10:02 AM

## 2021-01-18 ENCOUNTER — Other Ambulatory Visit: Payer: Self-pay

## 2021-01-18 ENCOUNTER — Emergency Department (HOSPITAL_COMMUNITY)
Admission: EM | Admit: 2021-01-18 | Discharge: 2021-01-19 | Disposition: A | Discharge status: Home / Self Care | Payer: BC Managed Care – PPO | Attending: Emergency Medicine | Admitting: Emergency Medicine

## 2021-01-18 ENCOUNTER — Encounter (HOSPITAL_COMMUNITY): Payer: Self-pay

## 2021-01-18 ENCOUNTER — Ambulatory Visit (HOSPITAL_BASED_OUTPATIENT_CLINIC_OR_DEPARTMENT_OTHER)
Admission: RE | Admit: 2021-01-18 | Discharge: 2021-01-18 | Disposition: A | Discharge status: Home / Self Care | Payer: BC Managed Care – PPO | Source: Ambulatory Visit | Attending: Family Medicine | Admitting: Family Medicine

## 2021-01-18 ENCOUNTER — Emergency Department (HOSPITAL_COMMUNITY)
Admission: EM | Admit: 2021-01-18 | Discharge: 2021-01-18 | Disposition: A | Discharge status: Home / Self Care | Payer: BC Managed Care – PPO | Source: Home / Self Care | Attending: Emergency Medicine | Admitting: Emergency Medicine

## 2021-01-18 ENCOUNTER — Encounter (HOSPITAL_COMMUNITY): Payer: Self-pay | Admitting: Emergency Medicine

## 2021-01-18 ENCOUNTER — Emergency Department (HOSPITAL_COMMUNITY): Payer: BC Managed Care – PPO

## 2021-01-18 VITALS — BP 100/68 | HR 91 | Wt 127.4 lb

## 2021-01-18 DIAGNOSIS — E039 Hypothyroidism, unspecified: Secondary | ICD-10-CM | POA: Insufficient documentation

## 2021-01-18 DIAGNOSIS — I110 Hypertensive heart disease with heart failure: Secondary | ICD-10-CM | POA: Insufficient documentation

## 2021-01-18 DIAGNOSIS — Z7984 Long term (current) use of oral hypoglycemic drugs: Secondary | ICD-10-CM | POA: Insufficient documentation

## 2021-01-18 DIAGNOSIS — E109 Type 1 diabetes mellitus without complications: Secondary | ICD-10-CM | POA: Insufficient documentation

## 2021-01-18 DIAGNOSIS — I2510 Atherosclerotic heart disease of native coronary artery without angina pectoris: Secondary | ICD-10-CM

## 2021-01-18 DIAGNOSIS — R1900 Intra-abdominal and pelvic swelling, mass and lump, unspecified site: Secondary | ICD-10-CM | POA: Insufficient documentation

## 2021-01-18 DIAGNOSIS — Z79899 Other long term (current) drug therapy: Secondary | ICD-10-CM | POA: Insufficient documentation

## 2021-01-18 DIAGNOSIS — Z85841 Personal history of malignant neoplasm of brain: Secondary | ICD-10-CM | POA: Insufficient documentation

## 2021-01-18 DIAGNOSIS — E1165 Type 2 diabetes mellitus with hyperglycemia: Secondary | ICD-10-CM | POA: Insufficient documentation

## 2021-01-18 DIAGNOSIS — R109 Unspecified abdominal pain: Secondary | ICD-10-CM | POA: Insufficient documentation

## 2021-01-18 DIAGNOSIS — Z7982 Long term (current) use of aspirin: Secondary | ICD-10-CM | POA: Insufficient documentation

## 2021-01-18 DIAGNOSIS — Z881 Allergy status to other antibiotic agents status: Secondary | ICD-10-CM | POA: Insufficient documentation

## 2021-01-18 DIAGNOSIS — R739 Hyperglycemia, unspecified: Secondary | ICD-10-CM

## 2021-01-18 DIAGNOSIS — Z79890 Hormone replacement therapy (postmenopausal): Secondary | ICD-10-CM | POA: Insufficient documentation

## 2021-01-18 DIAGNOSIS — Z8249 Family history of ischemic heart disease and other diseases of the circulatory system: Secondary | ICD-10-CM | POA: Insufficient documentation

## 2021-01-18 DIAGNOSIS — Z794 Long term (current) use of insulin: Secondary | ICD-10-CM | POA: Insufficient documentation

## 2021-01-18 DIAGNOSIS — R0602 Shortness of breath: Secondary | ICD-10-CM | POA: Insufficient documentation

## 2021-01-18 DIAGNOSIS — K859 Acute pancreatitis without necrosis or infection, unspecified: Secondary | ICD-10-CM | POA: Insufficient documentation

## 2021-01-18 DIAGNOSIS — Z880 Allergy status to penicillin: Secondary | ICD-10-CM | POA: Insufficient documentation

## 2021-01-18 DIAGNOSIS — I509 Heart failure, unspecified: Secondary | ICD-10-CM | POA: Insufficient documentation

## 2021-01-18 DIAGNOSIS — E781 Pure hyperglyceridemia: Secondary | ICD-10-CM

## 2021-01-18 DIAGNOSIS — I5022 Chronic systolic (congestive) heart failure: Secondary | ICD-10-CM

## 2021-01-18 DIAGNOSIS — Z888 Allergy status to other drugs, medicaments and biological substances status: Secondary | ICD-10-CM | POA: Insufficient documentation

## 2021-01-18 DIAGNOSIS — Z7951 Long term (current) use of inhaled steroids: Secondary | ICD-10-CM | POA: Insufficient documentation

## 2021-01-18 DIAGNOSIS — Z5321 Procedure and treatment not carried out due to patient leaving prior to being seen by health care provider: Secondary | ICD-10-CM | POA: Insufficient documentation

## 2021-01-18 DIAGNOSIS — Z833 Family history of diabetes mellitus: Secondary | ICD-10-CM | POA: Insufficient documentation

## 2021-01-18 LAB — BASIC METABOLIC PANEL
Anion gap: 14 (ref 5–15)
BUN: 72 mg/dL — ABNORMAL HIGH (ref 6–20)
CO2: 18 mmol/L — ABNORMAL LOW (ref 22–32)
Calcium: 9.5 mg/dL (ref 8.9–10.3)
Chloride: 91 mmol/L — ABNORMAL LOW (ref 98–111)
Creatinine, Ser: 2.41 mg/dL — ABNORMAL HIGH (ref 0.44–1.00)
GFR, Estimated: 27 mL/min — ABNORMAL LOW (ref >60)
Glucose, Bld: 269 mg/dL — ABNORMAL HIGH (ref 70–99)
Potassium: 6.2 mmol/L — ABNORMAL HIGH (ref 3.5–5.1)
Sodium: 123 mmol/L — ABNORMAL LOW (ref 135–145)

## 2021-01-18 LAB — COMPREHENSIVE METABOLIC PANEL
ALT: 43 U/L (ref 0–44)
AST: 57 U/L — ABNORMAL HIGH (ref 15–41)
Albumin: 4.2 g/dL (ref 3.5–5.0)
Alkaline Phosphatase: 65 U/L (ref 38–126)
Anion gap: 11 (ref 5–15)
BUN: 60 mg/dL — ABNORMAL HIGH (ref 6–20)
CO2: 21 mmol/L — ABNORMAL LOW (ref 22–32)
Calcium: 9.9 mg/dL (ref 8.9–10.3)
Chloride: 95 mmol/L — ABNORMAL LOW (ref 98–111)
Creatinine, Ser: 1.48 mg/dL — ABNORMAL HIGH (ref 0.44–1.00)
GFR, Estimated: 49 mL/min — ABNORMAL LOW (ref >60)
Glucose, Bld: 228 mg/dL — ABNORMAL HIGH (ref 70–99)
Potassium: 5.2 mmol/L — ABNORMAL HIGH (ref 3.5–5.1)
Sodium: 127 mmol/L — ABNORMAL LOW (ref 135–145)
Total Bilirubin: 1.3 mg/dL — ABNORMAL HIGH (ref 0.3–1.2)
Total Protein: 7.4 g/dL (ref 6.5–8.1)

## 2021-01-18 LAB — CBC
HCT: 33.7 % — ABNORMAL LOW (ref 36.0–46.0)
Hemoglobin: 11.7 g/dL — ABNORMAL LOW (ref 12.0–15.0)
MCH: 30.2 pg (ref 26.0–34.0)
MCHC: 34.7 g/dL (ref 30.0–36.0)
MCV: 86.9 fL (ref 80.0–100.0)
Platelets: 249 10*3/uL (ref 150–400)
RBC: 3.88 MIL/uL (ref 3.87–5.11)
RDW: 16.8 % — ABNORMAL HIGH (ref 11.5–15.5)
WBC: 7.3 10*3/uL (ref 4.0–10.5)
nRBC: 0 % (ref 0.0–0.2)

## 2021-01-18 LAB — CBC WITH DIFFERENTIAL/PLATELET
Abs Immature Granulocytes: 0.06 10*3/uL (ref 0.00–0.07)
Basophils Absolute: 0 10*3/uL (ref 0.0–0.1)
Basophils Relative: 0 %
Eosinophils Absolute: 0.2 10*3/uL (ref 0.0–0.5)
Eosinophils Relative: 2 %
HCT: 35.9 % — ABNORMAL LOW (ref 36.0–46.0)
Hemoglobin: 11.6 g/dL — ABNORMAL LOW (ref 12.0–15.0)
Immature Granulocytes: 1 %
Lymphocytes Relative: 23 %
Lymphs Abs: 2.2 10*3/uL (ref 0.7–4.0)
MCH: 28.6 pg (ref 26.0–34.0)
MCHC: 32.3 g/dL (ref 30.0–36.0)
MCV: 88.4 fL (ref 80.0–100.0)
Monocytes Absolute: 0.7 10*3/uL (ref 0.1–1.0)
Monocytes Relative: 7 %
Neutro Abs: 6.6 10*3/uL (ref 1.7–7.7)
Neutrophils Relative %: 67 %
Platelets: 233 10*3/uL (ref 150–400)
RBC: 4.06 MIL/uL (ref 3.87–5.11)
RDW: 16.9 % — ABNORMAL HIGH (ref 11.5–15.5)
WBC: 9.8 10*3/uL (ref 4.0–10.5)
nRBC: 0 % (ref 0.0–0.2)

## 2021-01-18 LAB — BRAIN NATRIURETIC PEPTIDE: B Natriuretic Peptide: 54.2 pg/mL (ref 0.0–100.0)

## 2021-01-18 LAB — I-STAT BETA HCG BLOOD, ED (MC, WL, AP ONLY)
I-stat hCG, quantitative: <5 m[IU]/mL (ref <5)
I-stat hCG, quantitative: <5 m[IU]/mL (ref <5)

## 2021-01-18 LAB — LIPASE, BLOOD: Lipase: 29 U/L (ref 11–51)

## 2021-01-18 LAB — TROPONIN I (HIGH SENSITIVITY)
Troponin I (High Sensitivity): 14 ng/L (ref <18)
Troponin I (High Sensitivity): 11 ng/L (ref <18)

## 2021-01-18 MED ORDER — HYDROMORPHONE HCL 1 MG/ML IJ SOLN
1.0000 mg | Freq: Once | INTRAMUSCULAR | Status: AC
  Administered 2021-01-18: 1 mg via INTRAVENOUS
  Filled 2021-01-18: qty 1

## 2021-01-18 MED ORDER — PROCHLORPERAZINE MALEATE 10 MG PO TABS: 10.0000 mg | ORAL_TABLET | Freq: Four times a day (QID) | ORAL | 0 refills | Status: DC | PRN

## 2021-01-18 MED ORDER — SODIUM CHLORIDE 0.9 % IV BOLUS: 1000.0000 mL | Freq: Once | INTRAVENOUS | Status: DC

## 2021-01-18 MED ORDER — PROCHLORPERAZINE EDISYLATE 10 MG/2ML IJ SOLN
10.0000 mg | Freq: Once | INTRAMUSCULAR | Status: AC
  Administered 2021-01-18: 10 mg via INTRAVENOUS
  Filled 2021-01-18: qty 2

## 2021-01-18 MED ORDER — OXYCODONE-ACETAMINOPHEN 10-325 MG PO TABS: 1.0000 | ORAL_TABLET | Freq: Four times a day (QID) | ORAL | 0 refills | Status: DC | PRN

## 2021-01-18 NOTE — ED Triage Notes (Addendum)
Pt arrives with upper abdominal pain. Admits hx of chronic nonalcoholic pancreatitis and insists the pain is very similar. Nausea and vomiting.

## 2021-01-18 NOTE — ED Provider Notes (Signed)
[facility] DEPT Provider Note: Georgena Spurling, MD, FACEP  CSN: [id number] MRN: [id number] ARRIVAL: 01/18/21 at [place]: [place]  Abdominal Pain   HISTORY OF PRESENT ILLNESS  01/18/21 5:28 AM Jennifer [name] is a 30 y.o. female with multiple medical problems including chronic pancreatitis.  She does not normally have elevated lipase with her pancreatitis because of chronic pancreatic exocrine insufficiency.  [name] here with an episode of epigastric pain that began about 230 this morning.  She rates her pain as a 7 out of 10, worse with movement or palpation.  The pain radiates into her left upper chest.  It is characterized as like previous pancreatitis.  She has had associated nausea and vomiting.   Past Medical History:  Diagnosis Date   Brain tumor ([place]) 03/29/1995   astrocytoma   CHF (congestive heart failure) ([place])    Cholesterosis    DM (diabetes mellitus) ([place]) 10/10/2018   Fatty liver    HTN (hypertension) 10/10/2018   Hypothyroidism 10/10/2018   Lipoprotein deficiency    Lung disease    longevity long term   Pancreatitis    Polycystic ovary syndrome     Past Surgical History:  Procedure Laterality Date   ABDOMINAL SURGERY     pt states during miscarriage got her intestine   BRAIN SURGERY     EYE MUSCLE SURGERY Right 03/28/2014   RIGHT/LEFT HEART CATH AND CORONARY ANGIOGRAPHY N/A 12/03/2020   Procedure: RIGHT/LEFT HEART CATH AND CORONARY ANGIOGRAPHY;  Surgeon: [name], MD;  Location: [facility];  Service: Cardiovascular;  Laterality: N/A;   VENTRICULOSTOMY  03/28/1997    Family History  Problem Relation Age of Onset   Diabetes Mother    Hypertension Mother    Hyperlipidemia Mother    Thyroid disease Mother    Hypertension Father    Diabetes Father     Social History   Tobacco Use   Smoking status: Never   Smokeless tobacco: Never  Vaping Use   Vaping Use: Never used  Substance Use Topics   Alcohol use: Never    Drug use: Never    Prior to Admission medications   Medication Sig Start Date End Date Taking? Authorizing Provider  Acetylcarnitine HCl (ACETYL L-CARNITINE) 500 MG CAPS Take 500 mg by mouth every morning.    [provider]  albuterol (VENTOLIN HFA) 108 (90 Base) MCG/ACT inhaler Inhale 2 puffs into the lungs every 6 (six) hours as needed for wheezing or shortness of breath. 12/31/18   [provider]  aspirin EC 81 MG EC tablet Take 1 tablet (81 mg total) by mouth daily. Swallow whole. 12/07/20   Ezekiel Slocumb, DO  cholecalciferol (VITAMIN D) 25 MCG tablet Take 1 tablet (1,000 Units total) by mouth daily. 12/07/20   Ezekiel Slocumb, DO  Coenzyme Q10 (COQ10) 30 MG CAPS Take 30 mg by mouth every morning.    [provider]  CRANBERRY FRUIT PO Take 1 capsule by mouth 2 (two) times a day.    [provider]  Cyanocobalamin (VITAMIN B12 PO) Take 1 drop by mouth every morning. liquid    [provider]  cyclobenzaprine (FLEXERIL) 10 MG tablet Take 10 mg by mouth daily as needed for muscle spasms. 11/19/18   [provider]  digoxin (LANOXIN) 0.125 MG tablet Take 1 tablet (0.125 mg total) by mouth daily. 12/07/20   Ezekiel Slocumb, DO  DULoxetine (CYMBALTA) 30 MG capsule Take 30 mg by mouth  at bedtime. 01/28/19 12/16/21  [provider]  empagliflozin (JARDIANCE) 10 MG TABS tablet Take 10 mg by mouth daily.    [provider]  Evolocumab (REPATHA) 140 MG/ML SOSY Inject 140 mg into the skin See admin instructions. Inject 140 mg subcutaneously every other Sunday evening    [provider]  fenofibrate 160 MG tablet Take 1 tablet (160 mg total) by mouth daily. 12/07/20   Nicole Kindred A, DO  fluticasone furoate-vilanterol (BREO ELLIPTA) 100-25 MCG/INH AEPB Inhale 1 puff into the lungs daily as needed (shortness of breath). 01/21/19   [provider]  insulin glargine, 1 Unit Dial, (TOUJEO SOLOSTAR) 300 UNIT/ML  Solostar Pen Inject 35 Units into the skin in the morning and at bedtime. Patient taking differently: Inject 90 Units into the skin in the morning and at bedtime. 12/06/20   Ezekiel Slocumb, DO  ivabradine (CORLANOR) 5 MG TABS tablet Take 1 tablet (5 mg total) by mouth 2 (two) times daily with a meal. 12/06/20   Ezekiel Slocumb, DO  levothyroxine (SYNTHROID) 125 MCG tablet Take 1 tablet (125 mcg total) by mouth daily at 6 (six) AM. 12/07/20   Ezekiel Slocumb, DO  lipase/protease/amylase (CREON) 36000 UNITS CPEP capsule Take 1 capsule (36,000 Units total) by mouth 3 (three) times daily with meals. 12/06/20   Ezekiel Slocumb, DO  metoprolol (TOPROL-XL) 200 MG 24 hr tablet Take 200 mg by mouth at bedtime.    [provider]  metoprolol succinate (TOPROL-XL) 25 MG 24 hr tablet Take 25 mg by mouth every morning. 12/01/20   [provider]  Multiple Vitamin (MULTIVITAMIN WITH MINERALS) TABS tablet Take 1 tablet by mouth daily. 12/07/20   Ezekiel Slocumb, DO  nitroGLYCERIN (NITROSTAT) 0.4 MG SL tablet Place 1 tablet (0.4 mg total) under the tongue every 5 (five) minutes as needed for up to 25 days for chest pain. 08/01/19 01/06/22  [name], MD  norethindrone (MICRONOR) 0.35 MG tablet Take 1 tablet by mouth every morning.    [provider]  NOVOLOG FLEXPEN 100 UNIT/ML FlexPen Inject 10-25 Units into the skin 3 (three) times daily. With meals.  Take 10 units PLUS 0-15 additional units per sliding scale instructions. Patient taking differently: Inject into the skin 3 (three) times daily before meals. 12/06/20   Ezekiel Slocumb, DO  ondansetron (ZOFRAN) 4 MG tablet Take 4 mg by mouth every 8 (eight) hours as needed for nausea or vomiting. 12/19/18   [provider]  pantoprazole (PROTONIX) 40 MG tablet Take 40 mg by mouth at bedtime. 12/24/19   [provider]  pregabalin (LYRICA) 100 MG capsule Take 300 mg by mouth every morning. 10/09/20   [provider]   pregabalin (LYRICA) 300 MG capsule Take 300 mg by mouth at bedtime.    [provider]  prochlorperazine (COMPAZINE) 10 MG tablet Take 1 tablet (10 mg total) by mouth 2 (two) times daily as needed for nausea or vomiting. 04/18/20   Pattricia Boss, MD  sacubitril-valsartan (ENTRESTO) 24-26 MG Take 1 tablet by mouth 2 (two) times daily. 01/06/21   Rafael Bihari, FNP  spironolactone (ALDACTONE) 25 MG tablet Take 0.5 tablets (12.5 mg total) by mouth daily. 12/07/20   Ezekiel Slocumb, DO  torsemide (DEMADEX) 20 MG tablet Take 2 tablets (40 mg total) by mouth daily. 01/06/21 04/06/21  Rafael Bihari, FNP    Allergies Ketamine, Clarithromycin, Penicillin v, Shellfish allergy, Penicillins, and Prednisone   REVIEW OF SYSTEMS  Negative except as noted here or in the History of Present Illness.   PHYSICAL EXAMINATION  Initial Vital Signs Blood pressure (!) 135/91, pulse 87, temperature 98 F (36.7 C), temperature source Oral, resp. rate 16, height 4' 11"  (1.499 m), weight 61.2 kg, SpO2 92 %.  Examination General: Small of stature female in no acute distress; appearance consistent with age of record HENT: normocephalic; atraumatic Eyes: Left pupil round and reactive to light; extraocular muscles intact on the left, minimal reactivity and movement on the right Neck: supple Heart: regular rate and rhythm Lungs: clear to auscultation bilaterally Abdomen: soft; epigastric tenderness; bowel sounds present Extremities: Left-sided atrophy, notably of the upper extremity Neurologic: Awake, alert and oriented; left hemiparesis Skin: Warm and dry Psychiatric: Normal mood and affect   RESULTS  Summary of this visit's results, reviewed and interpreted by myself:   EKG Interpretation  Date/Time:    Ventricular Rate:    PR Interval:    QRS Duration:   QT Interval:    QTC Calculation:   R Axis:     Text Interpretation:         Laboratory Studies: Results for orders placed  or performed during the hospital encounter of 01/18/21 (from the past 24 hour(s))  Lipase, blood     Status: None   Collection Time: 01/18/21  4:45 AM  Result Value Ref Range   Lipase 29 11 - 51 U/L  Comprehensive metabolic panel     Status: Abnormal   Collection Time: 01/18/21  4:45 AM  Result Value Ref Range   Sodium 127 (L) 135 - 145 mmol/L   Potassium 5.2 (H) 3.5 - 5.1 mmol/L   Chloride 95 (L) 98 - 111 mmol/L   CO2 21 (L) 22 - 32 mmol/L   Glucose, Bld 228 (H) 70 - 99 mg/dL   BUN 60 (H) 6 - 20 mg/dL   Creatinine, Ser 1.48 (H) 0.44 - 1.00 mg/dL   Calcium 9.9 8.9 - 10.3 mg/dL   Total Protein 7.4 6.5 - 8.1 g/dL   Albumin 4.2 3.5 - 5.0 g/dL   AST 57 (H) 15 - 41 U/L   ALT 43 0 - 44 U/L   Alkaline Phosphatase 65 38 - 126 U/L   Total Bilirubin 1.3 (H) 0.3 - 1.2 mg/dL   GFR, Estimated 49 (L) >60 mL/min   Anion gap 11 5 - 15  CBC     Status: Abnormal   Collection Time: 01/18/21  4:45 AM  Result Value Ref Range   WBC 7.3 4.0 - 10.5 K/uL   RBC 3.88 3.87 - 5.11 MIL/uL   Hemoglobin 11.7 (L) 12.0 - 15.0 g/dL   HCT 33.7 (L) 36.0 - 46.0 %   MCV 86.9 80.0 - 100.0 fL   MCH 30.2 26.0 - 34.0 pg   MCHC 34.7 30.0 - 36.0 g/dL   RDW 16.8 (H) 11.5 - 15.5 %   Platelets 249 150 - 400 K/uL   nRBC 0.0 0.0 - 0.2 %  I-Stat beta hCG blood, ED     Status: None   Collection Time: 01/18/21  4:58 AM  Result Value Ref Range   I-stat hCG, quantitative <5.0 <5 mIU/mL   Comment 3           Imaging Studies: No results found.  ED COURSE and MDM  Nursing notes, initial and subsequent vitals signs, including pulse oximetry, reviewed and interpreted by myself.  Vitals:   01/18/21 0433 01/18/21 0547 01/18/21 0630  BP: (!) 135/91  139/81 (!) 77/48  Pulse: 87 81 87  Resp: 16 16 14   Temp: 98 F (36.7 C)    TempSrc: Oral    SpO2: 92% 98% 92%  Weight: 61.2 kg    Height: 4' 11"  (1.499 m)     Medications  sodium chloride 0.9 % bolus 1,000 mL (has no administration in time range)  prochlorperazine  (COMPAZINE) injection 10 mg (10 mg Intravenous Given 01/18/21 0547)  HYDROmorphone (DILAUDID) injection 1 mg (1 mg Intravenous Given 01/18/21 0549)  HYDROmorphone (DILAUDID) injection 1 mg (1 mg Intravenous Given 01/18/21 0637)   6:52 AM Patient feeling better after 2 doses of Dilaudid.  She would like to try home treatment and avoid hospitalization.  I put likely due to Dilaudid.  We will give IV fluid bolus.   PROCEDURES  Procedures   ED DIAGNOSES     ICD-10-CM   1. Acute recurrent pancreatitis  K85.90     2. Hyperglycemia  R73.9          Milicent Acheampong, Jenny Reichmann, MD 01/18/21 [phone]

## 2021-01-18 NOTE — ED Triage Notes (Signed)
Pt sent from heart doc due to abd pain and swelling. Pt states she was seen and d/c for [facility] this morning.

## 2021-01-18 NOTE — ED Provider Notes (Signed)
Emergency Medicine Provider Triage Evaluation Note  Jennifer [name] , a 30 y.o. female  was evaluated in triage.  Pt complains of worsening pain secondary to her chronic pancreatitis as well as shortness of breath and lower abdominal swelling which she states has been going on for several months.  She was seen at [facility] this morning for her pancreatitis and treated with pain management and discharged home.  She subsequently saw her PCP in the outpatient setting who directed her back to the ER concerning for fluid overload.  Patient denies any acute worsening of her shortness of breath.  [name] on torsemide..  Review of Systems  Positive: Shortness of breath, epigastric pain Negative: Nausea, vomiting, diarrhea, fevers, chills  Physical Exam  BP 106/68   Pulse 90   Temp 97.8 F (36.6 C) (Oral)   Resp 18   SpO2 99%  Gen:   Awake, no distress   Resp:  Normal effort  MSK:   Moves extremities without difficulty  Other:  Abdomen tender to palpation in the epigastrium.  RRR no M/R/G.  Lungs CTA B.  Medical Decision Making  Medically screening exam initiated at 5:23 PM.  Appropriate orders placed.  Jennifer [name] was informed that the remainder of the evaluation will be completed by another provider, this initial triage assessment does not replace that evaluation, and the importance of remaining in the ED until their evaluation is complete.  No work-up was performed this morning regarding the patient's shortness of breath.  We will proceed with this work-up at this time.  This chart was dictated using voice recognition software, Dragon. Despite the best efforts of this provider to proofread and correct errors, errors may still occur which can change documentation meaning.     Jennifer Darling, PA-C 01/18/21 1744    Jennifer [name], Jennifer Allegra, MD 01/18/21 [phone]

## 2021-01-18 NOTE — Progress Notes (Addendum)
Advanced Heart Failure Clinic Note    PCP: Jennifer Lush, PA-C PCP-Cardiologist: Dr. Einar Gip HF Cardiologist: Dr. Haroldine Laws  HPI: Jennifer [name] is a 30 y.o. 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   Has been followed by Dr. Einar Gip for her HF.    Admitted 11/22/20 with ab pain and diarrhea. Ab CT concerning for vasculitis (no pancreatitis).Her TG were found to be  4030. She was started on an insulin/dextrose infusion. Cardiology and Nephrology were consulted after her creatinine and LFTs continued to rise and she began developing hypotension. Echo 11/28/20 EF was found to be 20-25% with moderate RV dysfunction and mild septal flattening. Started on DBA 2.5. AHF consulted for further management. PICC placed to follow CVP and Co-Ox. BP were high and milrinone added to augment diuresis. cMRI c/w prior myocarditis vs sarcoid (see below). Mod pericardial effusion also noted. LVEF 29%. RV normal. She underwent R/LHC showing single vessel RCA occlusion, RA 1, LVEDP 17, Fick CO 5.6/CI 3.88. She was able to be weaned of inotropes and GDMT titrated.    Post hospital follow up she was drinking >2L/day fluid and eating more salty foods, volume was up, ReDs 43%, instructed to increase lasix to 20 mg bid x 3 days. She followed up with [name] with [facility] Cardiology 12/17/20 for a 2nd opinion. He placed LifeVest and switched beta blocker to Toprol XL.  Called after hours service with swelling, instructed to continue lasix 20 mg bid.  Seen in ED 12/25/20 for fall. She was standing at work and became dizzy. She fell backwards and hit her head. CT neg, labs showed K 5.7. She was given 1 dose of Lokelma. She denied palpitations.  Volume overloaded at follow up 01/06/21, lasix switched to torsemide. She had a LifeVest per Cardiology at  [facility].  Echo (10/22): EF up to 45-50%, grade II DD, RV ok  Today she returns for HF follow up with her husband. Seen in ED this AM with concerns for recurrent acute pancreatitis. Lipase was not elevated due to chronic pancreatic exocrine insufficiency. Given IV fluid bolus and dilaudid; however she says she did not receive fluids. She says she is more SOB and her abdomen is tighter. No further dizziness with decreasing Entresto. Denies CP, dizziness, or PND/Orthopnea. Appetite poor, has been nauseated/vomiting earlier today. Weight at home 124 pounds. Taking all medications.   Cardiac Studies: - [facility] 12/03/20:  Normal right heart catheterization pressures.   RA 4/1, mean 1;  RV 23/4  EDP 6  PA 22/8, mean 11 PA saturation 76%.   PW 9/7, mean 7 mmHg.  CO 5.6, CI 3.88.   QP/QS 1.00.  LV: 136/9 EDP 17 mmHg.   Ao 131/86, mean 105 mmHg.  No pressure gradient across the aortic valve.  RCA: A very small but dominant RCA which is diffusely diseased around 80%.  Mid segment 99% occlusion.  Conus branch collateralizes distal RCA. LM: Large, smooth and normal. LAD: Large vessel, minimal diffuse disease.  Small to moderate-sized D1 and D2. CX: Large vessel.  Minimal diffuse disease.  Gives origin to large OM1 and OM 2.   Impression: Nonischemic cardiomyopathy, single vessel occlusion does not explain her markedly reduced LVEF.  Medical therapy for CAD and nonischemic cardiomyopathy.    - cMRI (12/01/20): LVEF 03% severe systolic dysfunction, normal RV size and systolic function, RV EF 00%, moderate  pericardial effusion, midwall LGE basal to mid ant/anterolateral walls (myocarditis vs sarcoidosis; rec cardiac PET to evaluate for sarcoid).  - Echo (1/21): EF 40%. Mod MR/TR (Dr. Einar Gip) - Lexiscan (2/21): EF 38% Normal perfusion - Echo (1/22): EF 35-40% Mod MR  - cMRI  08/04/2017 [facility]): LVEF 42% mod decreased, mild decrease RV function, findings suggestive of myocarditis and infarction, small  circumferential pericardial effusion.  Past Medical History:  Diagnosis Date   Brain tumor ([place]) 03/29/1995   astrocytoma   CHF (congestive heart failure) (HCC)    Cholesterosis    DM (diabetes mellitus) ([place]) 10/10/2018   Fatty liver    HTN (hypertension) 10/10/2018   Hypothyroidism 10/10/2018   Lipoprotein deficiency    Lung disease    longevity long term   Pancreatitis    Polycystic ovary syndrome    Current Outpatient Medications  Medication Sig Dispense Refill   Acetylcarnitine HCl (ACETYL L-CARNITINE) 500 MG CAPS Take 500 mg by mouth every morning.     albuterol (VENTOLIN HFA) 108 (90 Base) MCG/ACT inhaler Inhale 2 puffs into the lungs every 6 (six) hours as needed for wheezing or shortness of breath.     aspirin EC 81 MG EC tablet Take 1 tablet (81 mg total) by mouth daily. Swallow whole. 30 tablet 1   cholecalciferol (VITAMIN D) 25 MCG tablet Take 1 tablet (1,000 Units total) by mouth daily. 30 tablet 1   Coenzyme Q10 (COQ10) 30 MG CAPS Take 30 mg by mouth every morning.     CRANBERRY FRUIT PO Take 1 capsule by mouth 2 (two) times a day.     Cyanocobalamin (VITAMIN B12 PO) Take 1 drop by mouth every morning. liquid     cyclobenzaprine (FLEXERIL) 10 MG tablet Take 10 mg by mouth daily as needed for muscle spasms.     digoxin (LANOXIN) 0.125 MG tablet Take 1 tablet (0.125 mg total) by mouth daily. 30 tablet 1   DULoxetine (CYMBALTA) 30 MG capsule Take 30 mg by mouth at bedtime.     empagliflozin (JARDIANCE) 10 MG TABS tablet Take 10 mg by mouth daily.     Evolocumab (REPATHA) 140 MG/ML SOSY Inject 140 mg into the skin See admin instructions. Inject 140 mg subcutaneously every other Sunday evening     fenofibrate 160 MG tablet Take 1 tablet (160 mg total) by mouth daily. 30 tablet 1   fluticasone furoate-vilanterol (BREO ELLIPTA) 100-25 MCG/INH AEPB Inhale 1 puff into the lungs daily as needed (shortness of breath).     insulin glargine, 1 Unit Dial, (TOUJEO SOLOSTAR) 300  UNIT/ML Solostar Pen Inject 35 Units into the skin in the morning and at bedtime. (Patient taking differently: Inject 90 Units into the skin in the morning and at bedtime.)     ivabradine (CORLANOR) 5 MG TABS tablet Take 1 tablet (5 mg total) by mouth 2 (two) times daily with a meal. 60 tablet 1   levothyroxine (SYNTHROID) 125 MCG tablet Take 1 tablet (125 mcg total) by mouth daily at 6 (six) AM. 30 tablet 1   lipase/protease/amylase (CREON) 36000 UNITS CPEP capsule Take 1 capsule (36,000 Units total) by mouth 3 (three) times daily with meals. 270 capsule 0   metoprolol (TOPROL-XL) 200 MG 24 hr tablet Take 200 mg by mouth at bedtime.     metoprolol succinate (TOPROL-XL) 25 MG 24 hr tablet Take 25 mg by mouth every morning.     Multiple Vitamin (MULTIVITAMIN WITH MINERALS) TABS tablet Take 1 tablet by mouth daily. Johnson  tablet 1   nitroGLYCERIN (NITROSTAT) 0.4 MG SL tablet Place 1 tablet (0.4 mg total) under the tongue every 5 (five) minutes as needed for up to 25 days for chest pain. 25 tablet 3   norethindrone (MICRONOR) 0.35 MG tablet Take 1 tablet by mouth every morning.     NOVOLOG FLEXPEN 100 UNIT/ML FlexPen Inject 10-25 Units into the skin 3 (three) times daily. With meals.  Take 10 units PLUS 0-15 additional units per sliding scale instructions. (Patient taking differently: Inject into the skin 3 (three) times daily before meals.) 15 mL 11   ondansetron (ZOFRAN) 4 MG tablet Take 4 mg by mouth every 8 (eight) hours as needed for nausea or vomiting.     oxyCODONE-acetaminophen (PERCOCET) 10-325 MG tablet Take 1 tablet by mouth every 6 (six) hours as needed for pain. 20 tablet 0   pantoprazole (PROTONIX) 40 MG tablet Take 40 mg by mouth at bedtime.     pregabalin (LYRICA) 100 MG capsule Take 300 mg by mouth every morning.     pregabalin (LYRICA) 300 MG capsule Take 300 mg by mouth at bedtime.     prochlorperazine (COMPAZINE) 10 MG tablet Take 1 tablet (10 mg total) by mouth every 6 (six) hours as  needed for nausea or vomiting. 20 tablet 0   sacubitril-valsartan (ENTRESTO) 24-26 MG Take 1 tablet by mouth 2 (two) times daily. 60 tablet 11   spironolactone (ALDACTONE) 25 MG tablet Take 25 mg by mouth daily.     torsemide (DEMADEX) 20 MG tablet Take 20 mg by mouth daily.     No current facility-administered medications for this encounter.   Allergies  Allergen Reactions   Ketamine Other (See Comments)    In vegetative state for 15 minutes per pt   Clarithromycin Other (See Comments)    Stomach pain    Penicillin V Other (See Comments)   Shellfish Allergy Swelling and Other (See Comments)    Throat swelling. Pt states contrast in CT is okay   Penicillins Rash    Has patient had a PCN reaction causing immediate rash, facial/tongue/throat swelling, SOB or lightheadedness with hypotension: Y Has patient had a PCN reaction causing severe rash involving mucus membranes or skin necrosis: Y Has patient had a PCN reaction that required hospitalization: N Has patient had a PCN reaction occurring within the last 10 years: Y If all of the above answers are "NO", then may proceed with Cephalosporin use.    Prednisone Rash   Social History   Socioeconomic History   Marital status: Married    Spouse name: Not on file   Number of children: 0   Years of education: Not on file   Highest education level: Not on file  Occupational History   Not on file  Tobacco Use   Smoking status: Never   Smokeless tobacco: Never  Vaping Use   Vaping Use: Never used  Substance and Sexual Activity   Alcohol use: Never   Drug use: Never   Sexual activity: Not on file  Other Topics Concern   Not on file  Social History Narrative   Not on file   Social Determinants of Health   Financial Resource Strain: Low Risk    Difficulty of Paying Living Expenses: Not very hard  Food Insecurity: No Food Insecurity   Worried About Running Out of Food in the Last Year: Never true   [place] in the Last  Year: Never true  Transportation Needs: No Transportation  Needs   Lack of Transportation (Medical): No   Lack of Transportation (Non-Medical): No  Physical Activity: Not on file  Stress: Not on file  Social Connections: Not on file  Intimate Partner Violence: Not on file   Family History  Problem Relation Age of Onset   Diabetes Mother    Hypertension Mother    Hyperlipidemia Mother    Thyroid disease Mother    Hypertension Father    Diabetes Father    BP 100/68   Pulse 91   Wt 57.8 kg   SpO2 95%   BMI 25.73 kg/m   Wt Readings from Last 3 Encounters:  01/18/21 57.8 kg  01/18/21 61.2 kg  01/06/21 61 kg   PHYSICAL EXAM: General:  No resp difficulty, mildly toxic appearing HEENT: Right eye ptosis. Neck: Supple. No JVD. Carotids 2+ bilat; no bruits. No lymphadenopathy or thryomegaly appreciated. Cor: PMI nondisplaced. Regular rate & rhythm. No rubs, gallops or murmurs. Lungs: Clear Abdomen:  +distended, tender. No hepatosplenomegaly. No bruits or masses. Good bowel sounds. Extremities: No cyanosis, clubbing, rash, trace LE edema Neuro: Alert & oriented x 3, cranial nerves grossly intact. Moves all 4 extremities w/o difficulty. Affect pleasant. Flaccid LUE  ReDs: 41%, suggesting fluid overload.  ASSESSMENT & PLAN:  1. Acute recurrent pancreatitis - No pain currently with Dilaudid on board. - With borderline low BP and volume overloaded, likely needs inpatient management. Discussed with Dr. Haroldine Laws. - Recommend she go to ED, she and husband are agreeable.  2. Chronic Systolic Heart Failure - likely NICM - Echo (1/22): EF 35-40% Mod MR - Echo (11/28/20): EF 20-25% with moderate RV dysfunction and mild septal flattening. Mod-severe TR - Rheum serologies negative,  but Rheumatoid factor mildly elevated at 15.5 - cMRI c/w prior myocarditis vs sarcoid. May have delayed chemo cardiotoxcity  - R/LHC with single vessel RCA occlusion, RA 1, LVEDP 17, Fick CO 5.6/CI 3.88 -  Echo (10/22): EF 45-50%, grade II DD, RV ok - Worse NYHA IIIb, likely due to volume and pancreatitis. Volume up today, ReDs 41%, likely still has another 10 lbs of fluid on board. - Continue torsemide 40 mg daily.  - Would decrease spiro to 12.5 mg daily w/ history of hyperkalemia. - Continue Entresto 24/26 mg bid. - Continue Toprol XL 200 mg q hs. - Continue Corlanor 5 mg bid.  - Continue digoxin 0.125 mg daily.  - Continue Jardiance 10 mg daily (started recently by endocrinologist, Dr. Garrel Ridgel at [facility]). - [name] contracepting with OCPs, will need to avoid pregnancy with teratogenicity of current HF meds. - Labs drawn in ED reviewed. Needs dig level.  3. CAD - Single vessel RCA occlusion (small co-dominant vessel) on cardiac cath 09/08 - Medical management.  - No s/s ischemia.  - Continue aspirin + statin.   4. Severe hyperTG - Due to LPL deficiency. - Continue fenofibrate.  - Discussed referral to Lipid Clinic for further management. She prefers to continue with her endocrinologist at [facility] who currently manages/follows.   5. Type I DM - Follows w/ endocrinology at [facility].  Advised evaluation in ED. Patient and husband agreeable. Transported to ED via wheelchair.  Will need follow up with APP after discharge.  [place], FNP 01/18/21

## 2021-01-18 NOTE — Addendum Note (Signed)
Encounter addended by: Rafael Bihari, FNP on: 01/18/2021 8:33 PM  Actions taken: Clinical Note Signed

## 2021-01-18 NOTE — Addendum Note (Signed)
Encounter addended by: Rafael Bihari, FNP on: 01/18/2021 8:34 PM  Actions taken: Visit diagnoses modified

## 2021-01-19 NOTE — ED Notes (Signed)
Pt called multiple times no answer

## 2021-01-19 NOTE — ED Notes (Addendum)
Pt called multiple times no answer

## 2021-01-24 ENCOUNTER — Encounter (HOSPITAL_COMMUNITY): Payer: Self-pay

## 2021-01-24 ENCOUNTER — Emergency Department (HOSPITAL_COMMUNITY): Payer: BC Managed Care – PPO

## 2021-01-24 ENCOUNTER — Other Ambulatory Visit: Payer: Self-pay

## 2021-01-24 ENCOUNTER — Inpatient Hospital Stay (HOSPITAL_COMMUNITY)
Admission: EM | Admit: 2021-01-24 | Discharge: 2021-01-27 | DRG: 637 | Disposition: A | Discharge status: Home / Self Care | Payer: BC Managed Care – PPO | Attending: Family Medicine | Admitting: Family Medicine

## 2021-01-24 DIAGNOSIS — K760 Fatty (change of) liver, not elsewhere classified: Secondary | ICD-10-CM | POA: Diagnosis present

## 2021-01-24 DIAGNOSIS — Z79899 Other long term (current) drug therapy: Secondary | ICD-10-CM

## 2021-01-24 DIAGNOSIS — Z83438 Family history of other disorder of lipoprotein metabolism and other lipidemia: Secondary | ICD-10-CM

## 2021-01-24 DIAGNOSIS — R739 Hyperglycemia, unspecified: Secondary | ICD-10-CM

## 2021-01-24 DIAGNOSIS — E1122 Type 2 diabetes mellitus with diabetic chronic kidney disease: Secondary | ICD-10-CM | POA: Diagnosis present

## 2021-01-24 DIAGNOSIS — E875 Hyperkalemia: Secondary | ICD-10-CM | POA: Diagnosis present

## 2021-01-24 DIAGNOSIS — I5022 Chronic systolic (congestive) heart failure: Secondary | ICD-10-CM | POA: Diagnosis present

## 2021-01-24 DIAGNOSIS — K861 Other chronic pancreatitis: Secondary | ICD-10-CM | POA: Diagnosis present

## 2021-01-24 DIAGNOSIS — Z20822 Contact with and (suspected) exposure to covid-19: Secondary | ICD-10-CM | POA: Diagnosis present

## 2021-01-24 DIAGNOSIS — Z8349 Family history of other endocrine, nutritional and metabolic diseases: Secondary | ICD-10-CM

## 2021-01-24 DIAGNOSIS — Z85841 Personal history of malignant neoplasm of brain: Secondary | ICD-10-CM

## 2021-01-24 DIAGNOSIS — Z881 Allergy status to other antibiotic agents status: Secondary | ICD-10-CM

## 2021-01-24 DIAGNOSIS — E871 Hypo-osmolality and hyponatremia: Secondary | ICD-10-CM | POA: Diagnosis present

## 2021-01-24 DIAGNOSIS — E1165 Type 2 diabetes mellitus with hyperglycemia: Principal | ICD-10-CM | POA: Diagnosis present

## 2021-01-24 DIAGNOSIS — Z91013 Allergy to seafood: Secondary | ICD-10-CM

## 2021-01-24 DIAGNOSIS — E039 Hypothyroidism, unspecified: Secondary | ICD-10-CM | POA: Diagnosis present

## 2021-01-24 DIAGNOSIS — E781 Pure hyperglyceridemia: Secondary | ICD-10-CM | POA: Diagnosis present

## 2021-01-24 DIAGNOSIS — R0781 Pleurodynia: Secondary | ICD-10-CM | POA: Diagnosis not present

## 2021-01-24 DIAGNOSIS — K859 Acute pancreatitis without necrosis or infection, unspecified: Secondary | ICD-10-CM | POA: Diagnosis present

## 2021-01-24 DIAGNOSIS — N179 Acute kidney failure, unspecified: Secondary | ICD-10-CM | POA: Diagnosis present

## 2021-01-24 DIAGNOSIS — Z794 Long term (current) use of insulin: Secondary | ICD-10-CM

## 2021-01-24 DIAGNOSIS — Z888 Allergy status to other drugs, medicaments and biological substances status: Secondary | ICD-10-CM

## 2021-01-24 DIAGNOSIS — E860 Dehydration: Secondary | ICD-10-CM | POA: Diagnosis present

## 2021-01-24 DIAGNOSIS — Z833 Family history of diabetes mellitus: Secondary | ICD-10-CM

## 2021-01-24 DIAGNOSIS — I10 Essential (primary) hypertension: Secondary | ICD-10-CM | POA: Diagnosis present

## 2021-01-24 DIAGNOSIS — Z880 Allergy status to penicillin: Secondary | ICD-10-CM

## 2021-01-24 DIAGNOSIS — Z7982 Long term (current) use of aspirin: Secondary | ICD-10-CM

## 2021-01-24 DIAGNOSIS — N1832 Chronic kidney disease, stage 3b: Secondary | ICD-10-CM | POA: Diagnosis present

## 2021-01-24 DIAGNOSIS — N1831 Chronic kidney disease, stage 3a: Secondary | ICD-10-CM | POA: Diagnosis present

## 2021-01-24 DIAGNOSIS — I130 Hypertensive heart and chronic kidney disease with heart failure and stage 1 through stage 4 chronic kidney disease, or unspecified chronic kidney disease: Secondary | ICD-10-CM | POA: Diagnosis present

## 2021-01-24 DIAGNOSIS — E872 Acidosis, unspecified: Secondary | ICD-10-CM | POA: Diagnosis present

## 2021-01-24 DIAGNOSIS — G8194 Hemiplegia, unspecified affecting left nondominant side: Secondary | ICD-10-CM | POA: Diagnosis present

## 2021-01-24 DIAGNOSIS — Z8249 Family history of ischemic heart disease and other diseases of the circulatory system: Secondary | ICD-10-CM

## 2021-01-24 DIAGNOSIS — Z7984 Long term (current) use of oral hypoglycemic drugs: Secondary | ICD-10-CM

## 2021-01-24 DIAGNOSIS — G629 Polyneuropathy, unspecified: Secondary | ICD-10-CM | POA: Diagnosis present

## 2021-01-24 LAB — URINALYSIS, ROUTINE W REFLEX MICROSCOPIC
Bacteria, UA: NONE SEEN
Bilirubin Urine: NEGATIVE
Glucose, UA: >=500 mg/dL — AB
Ketones, ur: NEGATIVE mg/dL
Leukocytes,Ua: NEGATIVE
Nitrite: NEGATIVE
Protein, ur: NEGATIVE mg/dL
Specific Gravity, Urine: 1.02 (ref 1.005–1.030)
pH: 5 (ref 5.0–8.0)

## 2021-01-24 LAB — CBC WITH DIFFERENTIAL/PLATELET
Abs Immature Granulocytes: 0.03 10*3/uL (ref 0.00–0.07)
Basophils Absolute: 0.1 10*3/uL (ref 0.0–0.1)
Basophils Relative: 1 %
Eosinophils Absolute: 0.3 10*3/uL (ref 0.0–0.5)
Eosinophils Relative: 4 %
HCT: 34 % — ABNORMAL LOW (ref 36.0–46.0)
Hemoglobin: 12.2 g/dL (ref 12.0–15.0)
Immature Granulocytes: 0 %
Lymphocytes Relative: 18 %
Lymphs Abs: 1.4 10*3/uL (ref 0.7–4.0)
MCH: 31.2 pg (ref 26.0–34.0)
MCHC: 35.9 g/dL (ref 30.0–36.0)
MCV: 87 fL (ref 80.0–100.0)
Monocytes Absolute: 0.4 10*3/uL (ref 0.1–1.0)
Monocytes Relative: 6 %
Neutro Abs: 5.2 10*3/uL (ref 1.7–7.7)
Neutrophils Relative %: 71 %
Platelets: 231 10*3/uL (ref 150–400)
RBC: 3.91 MIL/uL (ref 3.87–5.11)
RDW: 16.7 % — ABNORMAL HIGH (ref 11.5–15.5)
WBC: 7.4 10*3/uL (ref 4.0–10.5)
nRBC: 0 % (ref 0.0–0.2)

## 2021-01-24 LAB — COMPREHENSIVE METABOLIC PANEL
ALT: 30 U/L (ref 0–44)
AST: 47 U/L — ABNORMAL HIGH (ref 15–41)
Albumin: 3.9 g/dL (ref 3.5–5.0)
Alkaline Phosphatase: 49 U/L (ref 38–126)
Anion gap: 14 (ref 5–15)
BUN: 56 mg/dL — ABNORMAL HIGH (ref 6–20)
CO2: 17 mmol/L — ABNORMAL LOW (ref 22–32)
Calcium: 9.5 mg/dL (ref 8.9–10.3)
Chloride: 87 mmol/L — ABNORMAL LOW (ref 98–111)
Creatinine, Ser: 1.71 mg/dL — ABNORMAL HIGH (ref 0.44–1.00)
GFR, Estimated: 41 mL/min — ABNORMAL LOW (ref >60)
Glucose, Bld: 596 mg/dL (ref 70–99)
Potassium: 5.8 mmol/L — ABNORMAL HIGH (ref 3.5–5.1)
Sodium: 118 mmol/L — CL (ref 135–145)
Total Bilirubin: 1.2 mg/dL (ref 0.3–1.2)
Total Protein: 7.6 g/dL (ref 6.5–8.1)

## 2021-01-24 LAB — TROPONIN I (HIGH SENSITIVITY)
Troponin I (High Sensitivity): 7 ng/L (ref <18)
Troponin I (High Sensitivity): 8 ng/L (ref <18)

## 2021-01-24 LAB — BRAIN NATRIURETIC PEPTIDE: B Natriuretic Peptide: 10.3 pg/mL (ref 0.0–100.0)

## 2021-01-24 LAB — RESP PANEL BY RT-PCR (FLU A&B, COVID) ARPGX2
Influenza A by PCR: NEGATIVE
Influenza B by PCR: NEGATIVE
SARS Coronavirus 2 by RT PCR: NEGATIVE

## 2021-01-24 LAB — I-STAT BETA HCG BLOOD, ED (MC, WL, AP ONLY): I-stat hCG, quantitative: <5 m[IU]/mL (ref <5)

## 2021-01-24 LAB — LIPASE, BLOOD: Lipase: 27 U/L (ref 11–51)

## 2021-01-24 MED ORDER — FENTANYL CITRATE PF 50 MCG/ML IJ SOSY
50.0000 ug | PREFILLED_SYRINGE | Freq: Once | INTRAMUSCULAR | Status: AC
  Administered 2021-01-24: 50 ug via INTRAVENOUS
  Filled 2021-01-24: qty 1

## 2021-01-24 MED ORDER — SODIUM CHLORIDE 0.9 % IV BOLUS
250.0000 mL | Freq: Once | INTRAVENOUS | Status: AC
  Administered 2021-01-25: 250 mL via INTRAVENOUS

## 2021-01-24 MED ORDER — MORPHINE SULFATE (PF) 4 MG/ML IV SOLN
4.0000 mg | Freq: Once | INTRAVENOUS | Status: DC
Start: 2021-01-24 — End: 2021-01-24

## 2021-01-24 MED ORDER — LACTATED RINGERS IV SOLN: INTRAVENOUS | Status: DC

## 2021-01-24 MED ORDER — INSULIN REGULAR(HUMAN) IN NACL 100-0.9 UT/100ML-% IV SOLN
INTRAVENOUS | Status: DC
  Administered 2021-01-25: 3.8 [IU]/h via INTRAVENOUS
  Filled 2021-01-24: qty 100

## 2021-01-24 MED ORDER — ONDANSETRON HCL 4 MG/2ML IJ SOLN
4.0000 mg | Freq: Once | INTRAMUSCULAR | Status: AC
Start: 2021-01-24 — End: 2021-01-24
  Administered 2021-01-24: 4 mg via INTRAVENOUS
  Filled 2021-01-24: qty 2

## 2021-01-24 MED ORDER — DEXTROSE 50 % IV SOLN: 0.0000 mL | INTRAVENOUS | Status: DC | PRN

## 2021-01-24 MED ORDER — DEXTROSE IN LACTATED RINGERS 5 % IV SOLN: INTRAVENOUS | Status: DC

## 2021-01-24 MED ORDER — HYDROMORPHONE HCL 1 MG/ML IJ SOLN
1.0000 mg | Freq: Once | INTRAMUSCULAR | Status: AC
  Administered 2021-01-24: 1 mg via INTRAVENOUS
  Filled 2021-01-24: qty 1

## 2021-01-24 MED ORDER — ONDANSETRON HCL 4 MG/2ML IJ SOLN
4.0000 mg | Freq: Once | INTRAMUSCULAR | Status: AC
  Administered 2021-01-24: 4 mg via INTRAVENOUS
  Filled 2021-01-24: qty 2

## 2021-01-24 NOTE — ED Provider Notes (Addendum)
Care assumed from PA Lauren Autry at shift change, please see her note for full details, but in brief Jennifer [name] is a 30 y.o. female with a complex past medical history of CHF with EF of 35-40%, chronic pancreatitis, hypertriglyceridemia, hypertension, diabetes, who presents for evaluation of chest pain, shortness of breath and abdominal pain.  Reports intermittent pleuritic chest pain starting yesterday evening associated with some nausea and diaphoresis.  Reports some associated shortness of breath.  No cough or fever.  Also reports epigastric abdominal pain that is mild to moderate, and associated with some nausea.  Similar to her typical pain with her chronic pancreatitis.  On arrival patient noted to be tachycardic and appears quite uncomfortable, mild epigastric tenderness on exam.  Work-up initiated for chest pain as well as abdominal pain.  If lab work is overall reassuring may need a CT angio to assess for potential PE given tachycardia and pleuritic nature of pain.  Lab work pending at shift change.  Physical Exam  BP 132/84   Pulse (!) 117   Temp 97.9 F (36.6 C) (Oral)   Resp 18   Ht 4' 11"  (1.499 m)   Wt 57.6 kg   LMP 12/09/2020   SpO2 94%   BMI 25.65 kg/m   Physical Exam Vitals and nursing note reviewed.  Constitutional:      General: [name] not in acute distress.    Appearance: Normal appearance. [name] well-developed. [name] not ill-appearing or diaphoretic.     Comments: Alert and mentating well, chronically ill-appearing but in no acute distress  HENT:     Head: Normocephalic and atraumatic.  Eyes:     General:        Right eye: No discharge.        Left eye: No discharge.  Cardiovascular:     Rate and Rhythm: Regular rhythm. Tachycardia present.  Pulmonary:     Effort: Pulmonary effort is normal. No respiratory distress.     Comments: Respirations are equal and unlabored, patient able to speak in full sentences Neurological:     Mental Status: [name]  alert and oriented to person, place, and time.     Coordination: Coordination normal.  Psychiatric:        Mood and Affect: Mood normal.        Behavior: Behavior normal.    ED Course/Procedures   Labs Reviewed  CBC WITH DIFFERENTIAL/PLATELET - Abnormal; Notable for the following components:      Result Value   HCT 34.0 (*)    RDW 16.7 (*)    All other components within normal limits  URINALYSIS, ROUTINE W REFLEX MICROSCOPIC - Abnormal; Notable for the following components:   Color, Urine STRAW (*)    Glucose, UA >=500 (*)    Hgb urine dipstick SMALL (*)    All other components within normal limits  COMPREHENSIVE METABOLIC PANEL - Abnormal; Notable for the following components:   Sodium 118 (*)    Potassium 5.8 (*)    Chloride 87 (*)    CO2 17 (*)    Glucose, Bld 596 (*)    BUN 56 (*)    Creatinine, Ser 1.71 (*)    AST 47 (*)    GFR, Estimated 41 (*)    All other components within normal limits  CBG MONITORING, ED - Abnormal; Notable for the following components:   Glucose-Capillary 491 (*)    All other components within normal limits  RESP PANEL BY RT-PCR (FLU A&B,  COVID) ARPGX2  BRAIN NATRIURETIC PEPTIDE  LIPASE, BLOOD  I-STAT BETA HCG BLOOD, ED ([facility], [facility], [facility] ONLY)  TROPONIN I (HIGH SENSITIVITY)  TROPONIN I (HIGH SENSITIVITY)   DG Chest 2 View  Result Date: 01/24/2021 CLINICAL DATA:  Chest pain EXAM: CHEST - 2 VIEW COMPARISON:  Chest x-ray 01/18/2021, CT chest 10/07/2020 FINDINGS: The heart and mediastinal contours are within normal limits. No focal consolidation. No pulmonary edema. No pleural effusion. No pneumothorax. No acute osseous abnormality. IMPRESSION: No active cardiopulmonary disease. Electronically Signed   By: Iven Finn M.D.   On: 01/24/2021 18:21     .Critical Care Performed by: Jacqlyn Larsen, PA-C Authorized by: Jacqlyn Larsen, PA-C   Critical care provider statement:    Critical care time (minutes):  45   Critical care time was exclusive  of:  Separately billable procedures and treating other patients and teaching time   Critical care was necessary to treat or prevent imminent or life-threatening deterioration of the following conditions:  Metabolic crisis   Critical care was time spent personally by me on the following activities:  Development of treatment plan with patient or surrogate, discussions with consultants, evaluation of patient's response to treatment, obtaining history from patient or surrogate, ordering and performing treatments and interventions, ordering and review of laboratory studies, ordering and review of radiographic studies, review of old charts, re-evaluation of patient's condition and pulse oximetry   Care discussed with: admitting provider    MDM   Patient did have some nonspecific T wave changes on EKG compared to recent ED visit, but troponins are negative x2.  Chest pain seems to be atypical.  CBC without leukocytosis or anemia.  CMP and lipase had to be redrawn and sent to code due to high lipid content, so these labs were delayed.  Chest x-ray without active cardiopulmonary disease.  After labs were spun down CMP returns with significant hyperglycemia of 596 with CO2 of 17 but normal anion gap, patient also with hyponatremia of 118 and potassium of 5.8.  Creatinine is actually improved from baseline at 1.71 with GFR 41.  LFTs are overall unremarkable and lipase is within normal limits.  Given patient's medical complexity with her CHF and multiple electrolyte derangements feel she would benefit from insulin drip during her last ED visit she was given IV fluids and became fluid overloaded so feel electrolyte derangements will need to be corrected more slowly.  We will start patient on insulin drip and give very small 250 mL NS bolus.  Patient will also need a CT PE study to further assess chest pain.  Her pain is much improved after pain control here in the ED but she remains tachycardic.  Case  discussed with Dr. Posey Pronto with [facility] hospitalist who will see and admit the patient will follow up on CT PE study.   Final diagnoses:  Hyperglycemia  Pleuritic chest pain  Chronic pancreatitis, unspecified pancreatitis type [facility])         Jacqlyn Larsen, PA-C 01/25/21 0037    Carmin Muskrat, MD 01/27/21 1710

## 2021-01-24 NOTE — ED Triage Notes (Signed)
Pt c/o left sided chest pain and epigastric pain since last night . Pt states that she believes she has been taking meds as prescribed, but could have missed something.

## 2021-01-24 NOTE — ED Provider Notes (Signed)
[facility] DEPT Provider Note   CSN: [id number] Arrival date & time: 01/24/21  1624     History Chief Complaint  Patient presents with   Chest Pain   Abdominal Pain    Jennifer [name] is a 30 y.o. female.  With past medical history of chronic pancreatitis, hypothyroid, nonalcoholic fatty liver disease, hypertriglyceridemia due to LPL deficiency, hypertension, diabetes, congestive heart failure with most recent EF 35 to 40% who presents emergency department with chest and abdominal pain.  Regarding the chest pain she states that this began yesterday evening.  States that she has episodes of intermittent, nonradiating left-sided chest pain that is tight in nature.  She said this is associated with nausea and diaphoresis.  [name] also having intermittent shortness of breath with the chest pain.  She denies fevers, recent infections, estrogen use, tobacco use, recent traveling or surgeries.  Regarding the abdominal pain she states she has a history of chronic pancreatitis and hypertriglyceridemia.  She states the pain is mild to moderate, intermittent and is associated with nausea without vomiting.  She states that she was recently seen for her chronic pancreatitis in the emergency department on 01/18/2021 and was discharged home after pain control and fluids.  She then saw her primary care provider in the outpatient setting who sent her back to the emergency department concerning for fluid volume overload.  Appears when she presented to the emergency department she left before being seen.   Chest Pain Associated symptoms: abdominal pain, diaphoresis, nausea and shortness of breath   Associated symptoms: no cough, no fever, no palpitations and no vomiting   Abdominal Pain Associated symptoms: chest pain, nausea and shortness of breath   Associated symptoms: no cough, no dysuria, no fever and no vomiting       Past Medical History:  Diagnosis Date   Brain  tumor ([place]) 03/29/1995   astrocytoma   CHF (congestive heart failure) ([place])    Cholesterosis    DM (diabetes mellitus) ([place]) 10/10/2018   Fatty liver    HTN (hypertension) 10/10/2018   Hypothyroidism 10/10/2018   Lipoprotein deficiency    Lung disease    longevity long term   Pancreatitis    Polycystic ovary syndrome     Patient Active Problem List   Diagnosis Date Noted   Diarrhea    Shock liver    Hypotension 11/28/2020   Acute combined systolic and diastolic heart failure (HCC)    Nonischemic cardiomyopathy (HCC)    Acute decompensated heart failure (HCC)    Elevated liver enzymes    Cardiogenic shock ([place])    AKI (acute kidney injury) ([place]) 11/26/2020   Abdominal pain 18/29/9371   Chronic systolic CHF (congestive heart failure) ([place]) 11/23/2020   Essential hypertension 11/23/2020   Hypertriglyceridemia 11/23/2020   Prolonged QT interval 11/23/2020   DM (diabetes mellitus) ([place]) 10/10/2018   Hypothyroidism 10/10/2018    Past Surgical History:  Procedure Laterality Date   ABDOMINAL SURGERY     pt states during miscarriage got her intestine   BRAIN SURGERY     EYE MUSCLE SURGERY Right 03/28/2014   RIGHT/LEFT HEART CATH AND CORONARY ANGIOGRAPHY N/A 12/03/2020   Procedure: RIGHT/LEFT HEART CATH AND CORONARY ANGIOGRAPHY;  Surgeon: [name], MD;  Location: [facility];  Service: Cardiovascular;  Laterality: N/A;   VENTRICULOSTOMY  03/28/1997     OB History   No obstetric history on file.     Family History  Problem Relation Age of Onset  Diabetes Mother    Hypertension Mother    Hyperlipidemia Mother    Thyroid disease Mother    Hypertension Father    Diabetes Father     Social History   Tobacco Use   Smoking status: Never   Smokeless tobacco: Never  Vaping Use   Vaping Use: Never used  Substance Use Topics   Alcohol use: Never   Drug use: Never    Home Medications Prior to Admission medications   Medication Sig Start Date End Date  Taking? Authorizing Provider  Acetylcarnitine HCl (ACETYL L-CARNITINE) 500 MG CAPS Take 500 mg by mouth every morning.    [provider]  albuterol (VENTOLIN HFA) 108 (90 Base) MCG/ACT inhaler Inhale 2 puffs into the lungs every 6 (six) hours as needed for wheezing or shortness of breath. 12/31/18   [provider]  aspirin EC 81 MG EC tablet Take 1 tablet (81 mg total) by mouth daily. Swallow whole. 12/07/20   Ezekiel Slocumb, DO  cholecalciferol (VITAMIN D) 25 MCG tablet Take 1 tablet (1,000 Units total) by mouth daily. 12/07/20   Ezekiel Slocumb, DO  Coenzyme Q10 (COQ10) 30 MG CAPS Take 30 mg by mouth every morning.    [provider]  CRANBERRY FRUIT PO Take 1 capsule by mouth 2 (two) times a day.    [provider]  Cyanocobalamin (VITAMIN B12 PO) Take 1 drop by mouth every morning. liquid    [provider]  cyclobenzaprine (FLEXERIL) 10 MG tablet Take 10 mg by mouth daily as needed for muscle spasms. 11/19/18   [provider]  digoxin (LANOXIN) 0.125 MG tablet Take 1 tablet (0.125 mg total) by mouth daily. 12/07/20   Ezekiel Slocumb, DO  DULoxetine (CYMBALTA) 30 MG capsule Take 30 mg by mouth at bedtime. 01/28/19 12/16/21  [provider]  empagliflozin (JARDIANCE) 10 MG TABS tablet Take 10 mg by mouth daily.    [provider]  Evolocumab (REPATHA) 140 MG/ML SOSY Inject 140 mg into the skin See admin instructions. Inject 140 mg subcutaneously every other Sunday evening    [provider]  fenofibrate 160 MG tablet Take 1 tablet (160 mg total) by mouth daily. 12/07/20   Nicole Kindred A, DO  fluticasone furoate-vilanterol (BREO ELLIPTA) 100-25 MCG/INH AEPB Inhale 1 puff into the lungs daily as needed (shortness of breath). 01/21/19   [provider]  insulin glargine, 1 Unit Dial, (TOUJEO SOLOSTAR) 300 UNIT/ML Solostar Pen Inject 35 Units into the skin in the morning and at bedtime. Patient taking  differently: Inject 90 Units into the skin in the morning and at bedtime. 12/06/20   Ezekiel Slocumb, DO  ivabradine (CORLANOR) 5 MG TABS tablet Take 1 tablet (5 mg total) by mouth 2 (two) times daily with a meal. 12/06/20   Ezekiel Slocumb, DO  levothyroxine (SYNTHROID) 125 MCG tablet Take 1 tablet (125 mcg total) by mouth daily at 6 (six) AM. 12/07/20   Ezekiel Slocumb, DO  lipase/protease/amylase (CREON) 36000 UNITS CPEP capsule Take 1 capsule (36,000 Units total) by mouth 3 (three) times daily with meals. 12/06/20   Ezekiel Slocumb, DO  metoprolol (TOPROL-XL) 200 MG 24 hr tablet Take 200 mg by mouth at bedtime.    [provider]  metoprolol succinate (TOPROL-XL) 25 MG 24 hr tablet Take 25 mg by mouth every morning. 12/01/20   [provider]  Multiple Vitamin (MULTIVITAMIN WITH MINERALS) TABS tablet Take 1 tablet by mouth daily. 12/07/20  Nicole Kindred A, DO  nitroGLYCERIN (NITROSTAT) 0.4 MG SL tablet Place 1 tablet (0.4 mg total) under the tongue every 5 (five) minutes as needed for up to 25 days for chest pain. 08/01/19 01/06/22  [name], MD  norethindrone (MICRONOR) 0.35 MG tablet Take 1 tablet by mouth every morning.    [provider]  NOVOLOG FLEXPEN 100 UNIT/ML FlexPen Inject 10-25 Units into the skin 3 (three) times daily. With meals.  Take 10 units PLUS 0-15 additional units per sliding scale instructions. Patient taking differently: Inject into the skin 3 (three) times daily before meals. 12/06/20   Ezekiel Slocumb, DO  ondansetron (ZOFRAN) 4 MG tablet Take 4 mg by mouth every 8 (eight) hours as needed for nausea or vomiting. 12/19/18   [provider]  oxyCODONE-acetaminophen (PERCOCET) 10-325 MG tablet Take 1 tablet by mouth every 6 (six) hours as needed for pain. 01/18/21   Molpus, John, MD  pantoprazole (PROTONIX) 40 MG tablet Take 40 mg by mouth at bedtime. 12/24/19   [provider]  pregabalin (LYRICA) 100 MG capsule Take 300 mg by  mouth every morning. 10/09/20   [provider]  pregabalin (LYRICA) 300 MG capsule Take 300 mg by mouth at bedtime.    [provider]  prochlorperazine (COMPAZINE) 10 MG tablet Take 1 tablet (10 mg total) by mouth every 6 (six) hours as needed for nausea or vomiting. 01/18/21   Molpus, John, MD  sacubitril-valsartan (ENTRESTO) 24-26 MG Take 1 tablet by mouth 2 (two) times daily. 01/06/21   Rafael Bihari, FNP  spironolactone (ALDACTONE) 25 MG tablet Take 25 mg by mouth daily.    [provider]  torsemide (DEMADEX) 20 MG tablet Take 20 mg by mouth daily.    [provider]    Allergies    Ketamine, Clarithromycin, Penicillin v, Shellfish allergy, Penicillins, and Prednisone  Review of Systems   Review of Systems  Constitutional:  Positive for appetite change and diaphoresis. Negative for fever.  Respiratory:  Positive for shortness of breath. Negative for cough.   Cardiovascular:  Positive for chest pain. Negative for palpitations and leg swelling.  Gastrointestinal:  Positive for abdominal pain and nausea. Negative for vomiting.  Genitourinary:  Negative for dysuria.  All other systems reviewed and are negative.  Physical Exam Updated Vital Signs BP (!) 149/95 (BP Location: Right Arm)   Pulse (!) 117   Temp 97.9 F (36.6 C) (Oral)   Resp 19   Ht 4' 11"  (1.499 m)   Wt 57.6 kg   SpO2 95%   BMI 25.65 kg/m   Physical Exam Vitals and nursing note reviewed.  Constitutional:      General: [name] not in acute distress.    Appearance: Normal appearance. [name] well-developed. [name] not toxic-appearing.  HENT:     Head: Normocephalic and atraumatic.  Eyes:     General: No scleral icterus.    Extraocular Movements: Extraocular movements intact.     Pupils: Pupils are equal, round, and reactive to light.  Neck:     Vascular: No JVD.  Cardiovascular:     Rate and Rhythm: Regular rhythm. Tachycardia present.     Pulses:          Radial  pulses are 2+ on the right side and 2+ on the left side.       Dorsalis pedis pulses are 2+ on the right side and 2+ on the left side.     Heart sounds:  Normal heart sounds. No murmur heard. Pulmonary:     Effort: Pulmonary effort is normal. No tachypnea or respiratory distress.     Breath sounds: Normal breath sounds.  Chest:     Chest wall: No tenderness.  Abdominal:     General: Abdomen is protuberant. Bowel sounds are normal. There is no distension.     Palpations: Abdomen is soft.     Tenderness: There is abdominal tenderness in the epigastric area and left upper quadrant. There is no guarding or rebound.  Musculoskeletal:        General: Normal range of motion.     Cervical back: Normal range of motion and neck supple.     Right lower leg: No edema.     Left lower leg: No edema.  Skin:    General: Skin is warm and dry.     Capillary Refill: Capillary refill takes less than 2 seconds.  Neurological:     General: No focal deficit present.     Mental Status: [name] alert and oriented to person, place, and time.  Psychiatric:        Mood and Affect: Mood normal.        Behavior: Behavior normal.    ED Results / Procedures / Treatments   Labs (all labs ordered are listed, but only abnormal results are displayed) Labs Reviewed  RESP PANEL BY RT-PCR (FLU A&B, COVID) ARPGX2  CBC WITH DIFFERENTIAL/PLATELET  COMPREHENSIVE METABOLIC PANEL  LIPASE, BLOOD  URINALYSIS, ROUTINE W REFLEX MICROSCOPIC  BRAIN NATRIURETIC PEPTIDE  I-STAT BETA HCG BLOOD, ED ([facility], [facility], [facility] ONLY)  TROPONIN I (HIGH SENSITIVITY)  TROPONIN I (HIGH SENSITIVITY)   EKG None  Radiology No results found.  Procedures Procedures   Medications Ordered in ED Medications  ondansetron (ZOFRAN) injection 4 mg (has no administration in time range)  fentaNYL (SUBLIMAZE) injection 50 mcg (has no administration in time range)    ED Course  I have reviewed the triage vital signs and the nursing notes.  Pertinent  labs & imaging results that were available during my care of the patient were reviewed by me and considered in my medical decision making (see chart for details).    MDM Rules/Calculators/A&P 30 year old female who presents emergency department with chest pain, shortness of breath.  She does have a significant past medical history including CHF, chronic pancreatitis and others as described above. EKG with changes compared to 01/14/2021 EKG.  Troponins currently pending. Care of patient is being handed off to [facility], PA-C at this time.  At time of handoff patient is pending complete work-up.  [name] still being ruled out for ACS.  If ACS is ruled out she will likely need CTA for PE study given her tachycardia and chest pain. Further disposition and plan will depend on the rest of her ED stay. Final Clinical Impression(s) / ED Diagnoses Final diagnoses:  None    Rx / DC Orders ED Discharge Orders     None        Mickie Hillier, PA-C 01/24/21 Maudie Flakes, MD 01/27/21 1709

## 2021-01-25 ENCOUNTER — Encounter (HOSPITAL_COMMUNITY): Payer: Self-pay | Admitting: Internal Medicine

## 2021-01-25 ENCOUNTER — Observation Stay (HOSPITAL_COMMUNITY): Payer: BC Managed Care – PPO

## 2021-01-25 DIAGNOSIS — E1165 Type 2 diabetes mellitus with hyperglycemia: Secondary | ICD-10-CM | POA: Diagnosis present

## 2021-01-25 LAB — BASIC METABOLIC PANEL
Anion gap: 13 (ref 5–15)
Anion gap: 12 (ref 5–15)
Anion gap: 10 (ref 5–15)
Anion gap: 13 (ref 5–15)
Anion gap: 12 (ref 5–15)
BUN: 60 mg/dL — ABNORMAL HIGH (ref 6–20)
BUN: 61 mg/dL — ABNORMAL HIGH (ref 6–20)
BUN: 58 mg/dL — ABNORMAL HIGH (ref 6–20)
BUN: 54 mg/dL — ABNORMAL HIGH (ref 6–20)
BUN: 58 mg/dL — ABNORMAL HIGH (ref 6–20)
CO2: 19 mmol/L — ABNORMAL LOW (ref 22–32)
CO2: 20 mmol/L — ABNORMAL LOW (ref 22–32)
CO2: 22 mmol/L (ref 22–32)
CO2: 19 mmol/L — ABNORMAL LOW (ref 22–32)
CO2: 20 mmol/L — ABNORMAL LOW (ref 22–32)
Calcium: 9.8 mg/dL (ref 8.9–10.3)
Calcium: 9.5 mg/dL (ref 8.9–10.3)
Calcium: 9.4 mg/dL (ref 8.9–10.3)
Calcium: 9.5 mg/dL (ref 8.9–10.3)
Calcium: 9.4 mg/dL (ref 8.9–10.3)
Chloride: 88 mmol/L — ABNORMAL LOW (ref 98–111)
Chloride: 91 mmol/L — ABNORMAL LOW (ref 98–111)
Chloride: 92 mmol/L — ABNORMAL LOW (ref 98–111)
Chloride: 95 mmol/L — ABNORMAL LOW (ref 98–111)
Chloride: 95 mmol/L — ABNORMAL LOW (ref 98–111)
Creatinine, Ser: 1.79 mg/dL — ABNORMAL HIGH (ref 0.44–1.00)
Creatinine, Ser: 1.77 mg/dL — ABNORMAL HIGH (ref 0.44–1.00)
Creatinine, Ser: 1.86 mg/dL — ABNORMAL HIGH (ref 0.44–1.00)
Creatinine, Ser: 2.01 mg/dL — ABNORMAL HIGH (ref 0.44–1.00)
Creatinine, Ser: 2.04 mg/dL — ABNORMAL HIGH (ref 0.44–1.00)
GFR, Estimated: 39 mL/min — ABNORMAL LOW (ref >60)
GFR, Estimated: 39 mL/min — ABNORMAL LOW (ref >60)
GFR, Estimated: 37 mL/min — ABNORMAL LOW (ref >60)
GFR, Estimated: 34 mL/min — ABNORMAL LOW (ref >60)
GFR, Estimated: 33 mL/min — ABNORMAL LOW (ref >60)
Glucose, Bld: 373 mg/dL — ABNORMAL HIGH (ref 70–99)
Glucose, Bld: 225 mg/dL — ABNORMAL HIGH (ref 70–99)
Glucose, Bld: 203 mg/dL — ABNORMAL HIGH (ref 70–99)
Glucose, Bld: 198 mg/dL — ABNORMAL HIGH (ref 70–99)
Glucose, Bld: 189 mg/dL — ABNORMAL HIGH (ref 70–99)
Potassium: 3.9 mmol/L (ref 3.5–5.1)
Potassium: 4.1 mmol/L (ref 3.5–5.1)
Potassium: 3.9 mmol/L (ref 3.5–5.1)
Potassium: 4.2 mmol/L (ref 3.5–5.1)
Potassium: 4 mmol/L (ref 3.5–5.1)
Sodium: 120 mmol/L — ABNORMAL LOW (ref 135–145)
Sodium: 123 mmol/L — ABNORMAL LOW (ref 135–145)
Sodium: 124 mmol/L — ABNORMAL LOW (ref 135–145)
Sodium: 127 mmol/L — ABNORMAL LOW (ref 135–145)
Sodium: 127 mmol/L — ABNORMAL LOW (ref 135–145)

## 2021-01-25 LAB — GLUCOSE, CAPILLARY
Glucose-Capillary: 393 mg/dL — ABNORMAL HIGH (ref 70–99)
Glucose-Capillary: 324 mg/dL — ABNORMAL HIGH (ref 70–99)
Glucose-Capillary: 351 mg/dL — ABNORMAL HIGH (ref 70–99)
Glucose-Capillary: 267 mg/dL — ABNORMAL HIGH (ref 70–99)
Glucose-Capillary: 213 mg/dL — ABNORMAL HIGH (ref 70–99)
Glucose-Capillary: 179 mg/dL — ABNORMAL HIGH (ref 70–99)
Glucose-Capillary: 222 mg/dL — ABNORMAL HIGH (ref 70–99)
Glucose-Capillary: 207 mg/dL — ABNORMAL HIGH (ref 70–99)
Glucose-Capillary: 189 mg/dL — ABNORMAL HIGH (ref 70–99)
Glucose-Capillary: 172 mg/dL — ABNORMAL HIGH (ref 70–99)
Glucose-Capillary: 154 mg/dL — ABNORMAL HIGH (ref 70–99)
Glucose-Capillary: 219 mg/dL — ABNORMAL HIGH (ref 70–99)
Glucose-Capillary: 162 mg/dL — ABNORMAL HIGH (ref 70–99)

## 2021-01-25 LAB — OSMOLALITY, URINE: Osmolality, Ur: 495 mOsm/kg (ref 300–900)

## 2021-01-25 LAB — BETA-HYDROXYBUTYRIC ACID
Beta-Hydroxybutyric Acid: 0.08 mmol/L (ref 0.05–0.27)
Beta-Hydroxybutyric Acid: 0.11 mmol/L (ref 0.05–0.27)

## 2021-01-25 LAB — MRSA NEXT GEN BY PCR, NASAL: MRSA by PCR Next Gen: NOT DETECTED

## 2021-01-25 LAB — HEMOGLOBIN A1C
Hgb A1c MFr Bld: 8.9 % — ABNORMAL HIGH (ref 4.8–5.6)
Mean Plasma Glucose: 208.73 mg/dL

## 2021-01-25 LAB — CBG MONITORING, ED
Glucose-Capillary: 415 mg/dL — ABNORMAL HIGH (ref 70–99)
Glucose-Capillary: 491 mg/dL — ABNORMAL HIGH (ref 70–99)

## 2021-01-25 LAB — CREATININE, URINE, RANDOM: Creatinine, Urine: 99.84 mg/dL

## 2021-01-25 LAB — SODIUM, URINE, RANDOM: Sodium, Ur: 13 mmol/L

## 2021-01-25 MED ORDER — METOPROLOL SUCCINATE ER 50 MG PO TB24
200.0000 mg | ORAL_TABLET | Freq: Every day | ORAL | Status: DC
  Administered 2021-01-25 – 2021-01-26 (×2): 200 mg via ORAL
  Filled 2021-01-25 (×3): qty 4

## 2021-01-25 MED ORDER — DIGOXIN 125 MCG PO TABS: 0.1250 mg | ORAL_TABLET | Freq: Every day | ORAL | Status: DC

## 2021-01-25 MED ORDER — LACTATED RINGERS IV SOLN: INTRAVENOUS | Status: DC

## 2021-01-25 MED ORDER — INSULIN ASPART 100 UNIT/ML IJ SOLN
0.0000 [IU] | Freq: Three times a day (TID) | INTRAMUSCULAR | Status: DC
Start: 2021-01-25 — End: 2021-01-27
  Administered 2021-01-25: 3 [IU] via SUBCUTANEOUS
  Administered 2021-01-25: 5 [IU] via SUBCUTANEOUS
  Administered 2021-01-26: 3 [IU] via SUBCUTANEOUS
  Administered 2021-01-26: 5 [IU] via SUBCUTANEOUS
  Administered 2021-01-26: 3 [IU] via SUBCUTANEOUS
  Administered 2021-01-27: 8 [IU] via SUBCUTANEOUS

## 2021-01-25 MED ORDER — PANCRELIPASE (LIP-PROT-AMYL) 36000-114000 UNITS PO CPEP
72000.0000 [IU] | ORAL_CAPSULE | Freq: Three times a day (TID) | ORAL | Status: DC
  Administered 2021-01-25 – 2021-01-27 (×7): 72000 [IU] via ORAL
  Filled 2021-01-25 (×7): qty 2

## 2021-01-25 MED ORDER — CYCLOBENZAPRINE HCL 10 MG PO TABS: 10.0000 mg | ORAL_TABLET | Freq: Every day | ORAL | Status: DC | PRN

## 2021-01-25 MED ORDER — IVABRADINE HCL 5 MG PO TABS
5.0000 mg | ORAL_TABLET | Freq: Every day | ORAL | Status: DC
  Administered 2021-01-25 – 2021-01-27 (×3): 5 mg via ORAL
  Filled 2021-01-25 (×3): qty 1

## 2021-01-25 MED ORDER — DULOXETINE HCL 30 MG PO CPEP
30.0000 mg | ORAL_CAPSULE | Freq: Every day | ORAL | Status: DC
  Administered 2021-01-25 – 2021-01-26 (×2): 30 mg via ORAL
  Filled 2021-01-25 (×2): qty 1

## 2021-01-25 MED ORDER — HYDROMORPHONE HCL 1 MG/ML IJ SOLN
0.5000 mg | INTRAMUSCULAR | Status: AC | PRN
  Administered 2021-01-25 (×2): 0.5 mg via INTRAVENOUS
  Filled 2021-01-25 (×2): qty 1

## 2021-01-25 MED ORDER — METOPROLOL SUCCINATE ER 50 MG PO TB24: 25.0000 mg | ORAL_TABLET | Freq: Every morning | ORAL | Status: DC

## 2021-01-25 MED ORDER — ALBUTEROL SULFATE (2.5 MG/3ML) 0.083% IN NEBU: 3.0000 mL | INHALATION_SOLUTION | Freq: Four times a day (QID) | RESPIRATORY_TRACT | Status: DC | PRN

## 2021-01-25 MED ORDER — HEPARIN SODIUM (PORCINE) 5000 UNIT/ML IJ SOLN
5000.0000 [IU] | Freq: Three times a day (TID) | INTRAMUSCULAR | Status: DC
  Administered 2021-01-25 – 2021-01-27 (×8): 5000 [IU] via SUBCUTANEOUS
  Filled 2021-01-25 (×8): qty 1

## 2021-01-25 MED ORDER — PREGABALIN 100 MG PO CAPS
100.0000 mg | ORAL_CAPSULE | Freq: Every morning | ORAL | Status: DC
  Administered 2021-01-25 – 2021-01-27 (×3): 100 mg via ORAL
  Filled 2021-01-25 (×3): qty 1

## 2021-01-25 MED ORDER — FLUTICASONE FUROATE-VILANTEROL 100-25 MCG/ACT IN AEPB
1.0000 | INHALATION_SPRAY | Freq: Every day | RESPIRATORY_TRACT | Status: DC | PRN
  Filled 2021-01-25: qty 28

## 2021-01-25 MED ORDER — DEXTROSE IN LACTATED RINGERS 5 % IV SOLN: INTRAVENOUS | Status: DC

## 2021-01-25 MED ORDER — LEVOTHYROXINE SODIUM 25 MCG PO TABS
125.0000 ug | ORAL_TABLET | Freq: Every day | ORAL | Status: DC
  Administered 2021-01-25 – 2021-01-27 (×3): 125 ug via ORAL
  Filled 2021-01-25 (×3): qty 1

## 2021-01-25 MED ORDER — CHLORHEXIDINE GLUCONATE CLOTH 2 % EX PADS
6.0000 | MEDICATED_PAD | Freq: Every day | CUTANEOUS | Status: DC
  Administered 2021-01-25 – 2021-01-26 (×2): 6 via TOPICAL

## 2021-01-25 MED ORDER — FUROSEMIDE 10 MG/ML IJ SOLN
40.0000 mg | Freq: Once | INTRAMUSCULAR | Status: AC
  Administered 2021-01-25: 40 mg via INTRAVENOUS
  Filled 2021-01-25: qty 4

## 2021-01-25 MED ORDER — FENOFIBRATE 160 MG PO TABS
160.0000 mg | ORAL_TABLET | Freq: Every day | ORAL | Status: DC
  Administered 2021-01-25 – 2021-01-27 (×3): 160 mg via ORAL
  Filled 2021-01-25 (×3): qty 1

## 2021-01-25 MED ORDER — OXYCODONE HCL 5 MG PO TABS
5.0000 mg | ORAL_TABLET | Freq: Once | ORAL | Status: AC | PRN
  Administered 2021-01-25: 5 mg via ORAL
  Filled 2021-01-25: qty 1

## 2021-01-25 MED ORDER — PANCRELIPASE (LIP-PROT-AMYL) 36000-114000 UNITS PO CPEP
36000.0000 [IU] | ORAL_CAPSULE | Freq: Three times a day (TID) | ORAL | Status: DC | PRN
  Filled 2021-01-25: qty 1

## 2021-01-25 MED ORDER — INSULIN GLARGINE-YFGN 100 UNIT/ML ~~LOC~~ SOLN
80.0000 [IU] | Freq: Two times a day (BID) | SUBCUTANEOUS | Status: DC
  Administered 2021-01-25: 80 [IU] via SUBCUTANEOUS
  Filled 2021-01-25 (×2): qty 0.8

## 2021-01-25 MED ORDER — IOHEXOL 350 MG/ML SOLN
75.0000 mL | Freq: Once | INTRAVENOUS | Status: AC | PRN
  Administered 2021-01-25: 75 mL via INTRAVENOUS

## 2021-01-25 MED ORDER — ONDANSETRON HCL 4 MG/2ML IJ SOLN
4.0000 mg | Freq: Four times a day (QID) | INTRAMUSCULAR | Status: DC | PRN
  Administered 2021-01-25 – 2021-01-26 (×2): 4 mg via INTRAVENOUS
  Filled 2021-01-25 (×2): qty 2

## 2021-01-25 MED ORDER — INSULIN ASPART 100 UNIT/ML IJ SOLN
10.0000 [IU] | Freq: Three times a day (TID) | INTRAMUSCULAR | Status: DC
  Administered 2021-01-25 – 2021-01-27 (×7): 10 [IU] via SUBCUTANEOUS

## 2021-01-25 MED ORDER — METOCLOPRAMIDE HCL 5 MG/ML IJ SOLN
10.0000 mg | Freq: Four times a day (QID) | INTRAMUSCULAR | Status: DC | PRN
  Administered 2021-01-25 – 2021-01-26 (×2): 10 mg via INTRAVENOUS
  Filled 2021-01-25 (×3): qty 2

## 2021-01-25 MED ORDER — INSULIN GLARGINE-YFGN 100 UNIT/ML ~~LOC~~ SOLN
80.0000 [IU] | Freq: Every day | SUBCUTANEOUS | Status: DC
  Administered 2021-01-25: 80 [IU] via SUBCUTANEOUS
  Filled 2021-01-25: qty 0.8

## 2021-01-25 MED ORDER — PREGABALIN 75 MG PO CAPS
150.0000 mg | ORAL_CAPSULE | Freq: Every day | ORAL | Status: DC
  Administered 2021-01-25 – 2021-01-26 (×3): 150 mg via ORAL
  Filled 2021-01-25: qty 2
  Filled 2021-01-25: qty 3
  Filled 2021-01-25: qty 2

## 2021-01-25 MED ORDER — ASPIRIN EC 81 MG PO TBEC
81.0000 mg | DELAYED_RELEASE_TABLET | Freq: Every day | ORAL | Status: DC
  Administered 2021-01-25 – 2021-01-27 (×3): 81 mg via ORAL
  Filled 2021-01-25 (×3): qty 1

## 2021-01-25 MED ORDER — TRAMADOL HCL 50 MG PO TABS
50.0000 mg | ORAL_TABLET | Freq: Two times a day (BID) | ORAL | Status: DC | PRN
  Administered 2021-01-25: 50 mg via ORAL
  Filled 2021-01-25: qty 1

## 2021-01-25 NOTE — Plan of Care (Signed)
Pt continues on insulin gtt. Q4 bmp. Prn given for nausea and vomiting

## 2021-01-25 NOTE — Progress Notes (Signed)
Inpatient Diabetes Program Recommendations  AACE/ADA: New Consensus Statement on Inpatient Glycemic Control (2015)  Target Ranges:  Prepandial:   less than 140 mg/dL      Peak postprandial:   less than 180 mg/dL (1-2 hours)      Critically ill patients:  140 - 180 mg/dL   Lab Results  Component Value Date   GLUCAP 162 (H) 01/25/2021   HGBA1C 8.9 (H) 01/25/2021    Review of Glycemic Control  Diabetes history: DM2 Outpatient Diabetes medications: Toujeo 95 units BID, Novolog 60 units TID Current orders for Inpatient glycemic control: Semglee 80 units BID, Novolog 0-15 units TID with meals + 10 units TID  HgbA1C - 8.9% CBGs today:154-219 mg/dL  Inpatient Diabetes Program Recommendations:    Agree with orders.   Spoke with pt at bedside regarding her glucose control and admission for hyperglycemia. Pt states HgbA1C is usually lower than 8.9%. Has Dexcom and monitors blood sugars frequently. Pt states she believes her insulin was from a bad batch and has called her pharmacy to report this. Uses insulin pens and claims all 5 pens in the prescription were bad. Pt states she injects insulin in abdomen rotating sites. Tries to eat a healthy diet. Hx hypertriglyceridemia and pancreatitis.   States she will f/u with her Endo at [facility].  Thank you. Lorenda Peck, RD, LDN, CDE Inpatient Diabetes Coordinator [phone]

## 2021-01-25 NOTE — ED Notes (Signed)
Patient transported to CT 

## 2021-01-25 NOTE — Progress Notes (Signed)
   Patient seen and examined at bedside, patient admitted after midnight, please see earlier detailed admission note by Lenore Cordia, MD. Briefly, patient presented secondary to epigastric pain and found to have severe hyperglycemia and an AKI. History of chronic pancreatitis with low lipase.  Subjective: No issues this morning. Feels better  BP 109/67   Pulse 95   Temp (!) 97.5 F (36.4 C) (Oral)   Resp 14   Ht 4' 11.02" (1.499 m)   Wt 55.7 kg   LMP 12/09/2020   SpO2 97%   BMI 24.79 kg/m   General exam: Appears calm and comfortable  Respiratory system: Clear to auscultation. Respiratory effort normal. Cardiovascular system: S1 & S2 heard, RRR. No murmurs, rubs, gallops or clicks. Gastrointestinal system: Abdomen is nondistended, soft and nontender. No organomegaly or masses felt. Normal bowel sounds heard. Central nervous system: Alert and oriented. No focal neurological deficits. Musculoskeletal: No edema. No calf tenderness Skin: No cyanosis. No rashes Psychiatry: Judgement and insight appear normal. Mood & affect appropriate.   Brief assessment/Plan:  Severe hyperglycemia Diabetes mellitus, type 2 Patient did not meet criteria for DKA on admission. Patient takes insulin glargine 95 units BID as an outpatient in addition to Novolog meal coverage. Started on IV insulin with improvement of blood sugar and acidosis.  -Discontinue IV insulin -Semglee 80 units (reduced dose) BID -Novolog 10 units TID with meals -SSI  Hyperkalemia Resolved with IV fluids  Hyponatremia Sodium of 118 on admission. Low even when corrected for hyperglycemia. Improved slightly. History of chronic hyponatremia. Patient with abdominal edema with some BLE edema. FENa suggests pre-renal cause. Concern this may actually be secondary to hypervolemia -Repeat BMP -Will likely start Lasix IV  AKI Possibly secondary to poor perfusion in setting of fluid overload with underlying heart failure -Daily  BMP  HFrEF Patient follows with heart failure clinic. EF of 45-50%. Patient is on Entresto, aldactone, Toprol-XL, digoxin, torsemide as an outpatient -Continue metoprolol and digoxin  Primary hypertension Patient is on Metoprolol, Entresto and Aldactone as an outpatient. Held at this time secondary to AKI. -Continue metoprolol  Hypothyroidism -Continue Synthroid  Chronic pancreatitis -Resume home Creon  Neuropathy -Continue home Lyrica, reduced for kidney dysfunction  Hyperlipidemia -Resume home fenofibrate  History astrocytoma -History of resection/chemotherapy in childhood. Residual left-sided weakness.   Family communication: None at bedside DVT prophylaxis: Heparin Disposition: Discharge home likely in 24 hours  Cordelia Poche, MD [facility] 01/25/2021, 12:01 PM

## 2021-01-25 NOTE — Plan of Care (Signed)
  Problem: Education: Goal: Knowledge of General Education information will improve Description: Including pain rating scale, medication(s)/side effects and non-pharmacologic comfort measures Outcome: Progressing   Problem: Health Behavior/Discharge Planning: Goal: Ability to manage health-related needs will improve Outcome: Progressing   Problem: Clinical Measurements: Goal: Will remain free from infection Outcome: Progressing   

## 2021-01-25 NOTE — H&P (Signed)
History and Physical    Jennifer [name] HBZ:[id number] DOB: 1990/06/10 DOA: 01/24/2021  PCP: Sue Lush, PA-C  Patient coming from: Home  I have personally briefly reviewed patient's old medical records in [place]  Chief Complaint: Epigastric pain  HPI: Jennifer [name] is a 30 y.o. 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  Patient states that she has a bad batch of insulin at home.  Over the last 2 days her CBGs have been >400 on her home monitoring kit.  She has been having increased nausea, vomiting, and epigastric pain from baseline.  She also reports new left-sided sharp chest discomfort that does not radiate to her arms, neck, jaw, back.  She states that she is been very thirsty and drinking a lot of fluids but has less urine output than she expects.  ED Course:  Initial vitals showed BP 145/94, pulse 103, RR 19, temp 97.9 F, SPO2 97% on room air.  Labs significant for serum glucose 596, potassium 5.8, sodium 118 (131 corrected for hyperglycemia), chloride 87, bicarb 17, BUN 56, creatinine 1.71, anion gap 14, AST 47, ALT 30, alkaline phosphatase 49, total bilirubin 1.2, lipase 27, BNP 10.3, high-sensitivity troponin 7 >8, WBC 7.4, hemoglobin 12.2, platelets 231,000.  Urinalysis shows >500 glucose, negative ketones, negative protein, negative nitrates, negative leukocytes, 0-5 RBCs and WBCs per hpf, no bacteria microscopy.  I-STAT beta-hCG <5.0.  SARS-CoV-2 and influenza PCR's are negative.  2 view chest x-ray negative for focal consolidation, edema, effusion.  CTA chest was ordered given patient's tachycardia, exam is currently in process.  Patient was given 250 cc normal saline  bolus, IV Dilaudid, IV fentanyl, IV Zofran.  Patient was started on insulin infusion with IV fluids.  The hospitalist service was consulted to admit for further evaluation and management.  Review of Systems: All systems reviewed and are negative except as documented in history of present illness above.   Past Medical History:  Diagnosis Date   Brain tumor ([place]) 03/29/1995   astrocytoma   CHF (congestive heart failure) ([place])    Cholesterosis    DM (diabetes mellitus) ([place]) 10/10/2018   Fatty liver    HTN (hypertension) 10/10/2018   Hypothyroidism 10/10/2018   Lipoprotein deficiency    Lung disease    longevity long term   Pancreatitis    Polycystic ovary syndrome     Past Surgical History:  Procedure Laterality Date   ABDOMINAL SURGERY     pt states during miscarriage got her intestine   BRAIN SURGERY     EYE MUSCLE SURGERY Right 03/28/2014   RIGHT/LEFT HEART CATH AND CORONARY ANGIOGRAPHY N/A 12/03/2020   Procedure: RIGHT/LEFT HEART CATH AND CORONARY ANGIOGRAPHY;  Surgeon: [name], MD;  Location: [facility];  Service: Cardiovascular;  Laterality: N/A;   VENTRICULOSTOMY  03/28/1997    Social History:  reports that she has never smoked. She has never used smokeless tobacco. She reports that she does not drink alcohol and does not use drugs.  Allergies  Allergen Reactions   Ketamine Other (See Comments)    In vegetative state for 15 minutes per pt   Clarithromycin Other (See Comments)    Stomach pain    Penicillin V Other (See Comments)   Shellfish Allergy Swelling and Other (See  Comments)    Throat swelling. Pt states contrast in CT is okay   Penicillins Rash    Has patient had a PCN reaction causing immediate rash, facial/tongue/throat swelling, SOB or lightheadedness with hypotension: Y Has patient had a PCN reaction causing severe rash involving mucus membranes or skin necrosis: Y Has patient had a PCN reaction that required hospitalization: N Has patient  had a PCN reaction occurring within the last 10 years: Y If all of the above answers are "NO", then may proceed with Cephalosporin use.    Prednisone Rash    Family History  Problem Relation Age of Onset   Diabetes Mother    Hypertension Mother    Hyperlipidemia Mother    Thyroid disease Mother    Hypertension Father    Diabetes Father      Prior to Admission medications   Medication Sig Start Date End Date Taking? Authorizing Provider  Acetylcarnitine HCl (ACETYL L-CARNITINE) 500 MG CAPS Take 500 mg by mouth every morning.    [provider]  albuterol (VENTOLIN HFA) 108 (90 Base) MCG/ACT inhaler Inhale 2 puffs into the lungs every 6 (six) hours as needed for wheezing or shortness of breath. 12/31/18   [provider]  aspirin EC 81 MG EC tablet Take 1 tablet (81 mg total) by mouth daily. Swallow whole. 12/07/20   Ezekiel Slocumb, DO  cholecalciferol (VITAMIN D) 25 MCG tablet Take 1 tablet (1,000 Units total) by mouth daily. 12/07/20   Ezekiel Slocumb, DO  Coenzyme Q10 (COQ10) 30 MG CAPS Take 30 mg by mouth every morning.    [provider]  CRANBERRY FRUIT PO Take 1 capsule by mouth 2 (two) times a day.    [provider]  Cyanocobalamin (VITAMIN B12 PO) Take 1 drop by mouth every morning. liquid    [provider]  cyclobenzaprine (FLEXERIL) 10 MG tablet Take 10 mg by mouth daily as needed for muscle spasms. 11/19/18   [provider]  digoxin (LANOXIN) 0.125 MG tablet Take 1 tablet (0.125 mg total) by mouth daily. 12/07/20   Ezekiel Slocumb, DO  DULoxetine (CYMBALTA) 30 MG capsule Take 30 mg by mouth at bedtime. 01/28/19 12/16/21  [provider]  empagliflozin (JARDIANCE) 10 MG TABS tablet Take 10 mg by mouth daily.    [provider]  Evolocumab (REPATHA) 140 MG/ML SOSY Inject 140 mg into the skin See admin instructions. Inject 140 mg subcutaneously every other Sunday evening    [provider]   fenofibrate 160 MG tablet Take 1 tablet (160 mg total) by mouth daily. 12/07/20   Nicole Kindred A, DO  fluticasone furoate-vilanterol (BREO ELLIPTA) 100-25 MCG/INH AEPB Inhale 1 puff into the lungs daily as needed (shortness of breath). 01/21/19   [provider]  insulin glargine, 1 Unit Dial, (TOUJEO SOLOSTAR) 300 UNIT/ML Solostar Pen Inject 35 Units into the skin in the morning and at bedtime. Patient taking differently: Inject 90 Units into the skin in the morning and at bedtime. 12/06/20   Ezekiel Slocumb, DO  ivabradine (CORLANOR) 5 MG TABS tablet Take 1 tablet (5 mg total) by mouth 2 (two) times daily with a meal. 12/06/20   Ezekiel Slocumb, DO  levothyroxine (SYNTHROID) 125 MCG tablet Take 1 tablet (125 mcg total) by mouth daily at 6 (six) AM. 12/07/20   Ezekiel Slocumb, DO  lipase/protease/amylase (CREON) 36000 UNITS CPEP capsule Take 1 capsule (36,000 Units total) by mouth 3 (three) times daily with meals.  12/06/20   Ezekiel Slocumb, DO  metoprolol (TOPROL-XL) 200 MG 24 hr tablet Take 200 mg by mouth at bedtime.    [provider]  metoprolol succinate (TOPROL-XL) 25 MG 24 hr tablet Take 25 mg by mouth every morning. 12/01/20   [provider]  Multiple Vitamin (MULTIVITAMIN WITH MINERALS) TABS tablet Take 1 tablet by mouth daily. 12/07/20   Ezekiel Slocumb, DO  nitroGLYCERIN (NITROSTAT) 0.4 MG SL tablet Place 1 tablet (0.4 mg total) under the tongue every 5 (five) minutes as needed for up to 25 days for chest pain. 08/01/19 01/06/22  [name], MD  norethindrone (MICRONOR) 0.35 MG tablet Take 1 tablet by mouth every morning.    [provider]  NOVOLOG FLEXPEN 100 UNIT/ML FlexPen Inject 10-25 Units into the skin 3 (three) times daily. With meals.  Take 10 units PLUS 0-15 additional units per sliding scale instructions. Patient taking differently: Inject into the skin 3 (three) times daily before meals. 12/06/20   Ezekiel Slocumb, DO  ondansetron  (ZOFRAN) 4 MG tablet Take 4 mg by mouth every 8 (eight) hours as needed for nausea or vomiting. 12/19/18   [provider]  oxyCODONE-acetaminophen (PERCOCET) 10-325 MG tablet Take 1 tablet by mouth every 6 (six) hours as needed for pain. 01/18/21   Molpus, John, MD  pantoprazole (PROTONIX) 40 MG tablet Take 40 mg by mouth at bedtime. 12/24/19   [provider]  pregabalin (LYRICA) 100 MG capsule Take 300 mg by mouth every morning. 10/09/20   [provider]  pregabalin (LYRICA) 300 MG capsule Take 300 mg by mouth at bedtime.    [provider]  prochlorperazine (COMPAZINE) 10 MG tablet Take 1 tablet (10 mg total) by mouth every 6 (six) hours as needed for nausea or vomiting. 01/18/21   Molpus, John, MD  sacubitril-valsartan (ENTRESTO) 24-26 MG Take 1 tablet by mouth 2 (two) times daily. 01/06/21   Rafael Bihari, FNP  spironolactone (ALDACTONE) 25 MG tablet Take 25 mg by mouth daily.    [provider]  torsemide (DEMADEX) 20 MG tablet Take 20 mg by mouth daily.    [provider]    Physical Exam: Vitals:   01/24/21 2300 01/24/21 2330 01/25/21 0000 01/25/21 0012  BP: 126/90 112/79 111/85 111/85  Pulse: (!) 117 (!) 109 (!) 107 (!) 117  Resp: (!) 25 (!) _0 Temp:      TempSrc:      SpO2: 95% 95% 95% 96%  Weight:      Height:       Constitutional: Resting in bed with head elevated, NAD, calm, comfortable Eyes: PERRL, lids and conjunctivae normal ENMT: Mucous membranes are dry. Posterior pharynx clear of any exudate or lesions.Normal dentition.  Neck: normal, supple, no masses. Respiratory: clear to auscultation bilaterally, no wheezing, no crackles. Normal respiratory effort. No accessory muscle use.  Cardiovascular: Tachycardic, no murmurs / rubs / gallops. No extremity edema. 2+ pedal pulses. Abdomen: Mild epigastric and left lower quadrant tenderness, no masses palpated. No hepatosplenomegaly. Bowel sounds diminished.   Musculoskeletal: no clubbing / cyanosis.  Contracture left wrist with otherwise intact ROM. Normal muscle tone.  Skin: no rashes, lesions, ulcers. No induration Neurologic: CN 2-12 grossly intact. Sensation intact. Strength 4/5 LUE otherwise 5/5 in RUE and bilateral lower extremities. Psychiatric: Normal judgment and insight. Alert and oriented x 3. Normal mood.   Labs on Admission: I have personally reviewed following labs and imaging studies  CBC:  Recent Labs  Lab 01/18/21 0445 01/18/21 1752 01/24/21 1647  WBC 7.3 9.8 7.4  NEUTROABS  --  6.6 5.2  HGB 11.7* 11.6* 12.2  HCT 33.7* 35.9* 34.0*  MCV 86.9 88.4 87.0  PLT 249 233 892   Basic Metabolic Panel: Recent Labs  Lab 01/18/21 0445 01/18/21 1752 01/24/21 2029  NA 127* 123* 118*  K 5.2* 6.2* 5.8*  CL 95* 91* 87*  CO2 21* 18* 17*  GLUCOSE 228* 269* 596*  BUN 60* 72* 56*  CREATININE 1.48* 2.41* 1.71*  CALCIUM 9.9 9.5 9.5   GFR: Estimated Creatinine Clearance: 37.5 mL/min (A) (by C-G formula based on SCr of 1.71 mg/dL (H)). Liver Function Tests: Recent Labs  Lab 01/18/21 0445 01/24/21 2029  AST 57* 47*  ALT 43 30  ALKPHOS 65 49  BILITOT 1.3* 1.2  PROT 7.4 7.6  ALBUMIN 4.2 3.9   Recent Labs  Lab 01/18/21 0445 01/24/21 2029  LIPASE 29 27   No results for input(s): AMMONIA in the last 168 hours. Coagulation Profile: No results for input(s): INR, PROTIME in the last 168 hours. Cardiac Enzymes: No results for input(s): CKTOTAL, CKMB, CKMBINDEX, TROPONINI in the last 168 hours. BNP (last 3 results) No results for input(s): PROBNP in the last 8760 hours. HbA1C: No results for input(s): HGBA1C in the last 72 hours. CBG: No results for input(s): GLUCAP in the last 168 hours. Lipid Profile: No results for input(s): CHOL, HDL, LDLCALC, TRIG, CHOLHDL, LDLDIRECT in the last 72 hours. Thyroid Function Tests: No results for input(s): TSH, T4TOTAL, FREET4, T3FREE, THYROIDAB in the last 72 hours. Anemia Panel: No  results for input(s): VITAMINB12, FOLATE, FERRITIN, TIBC, IRON, RETICCTPCT in the last 72 hours. Urine analysis:    Component Value Date/Time   COLORURINE STRAW (A) 01/24/2021 2030   APPEARANCEUR CLEAR 01/24/2021 2030   LABSPEC 1.020 01/24/2021 2030   PHURINE 5.0 01/24/2021 2030   GLUCOSEU >=500 (A) 01/24/2021 2030   HGBUR SMALL (A) 01/24/2021 2030   [place] NEGATIVE 01/24/2021 2030   [place] 01/24/2021 2030   PROTEINUR NEGATIVE 01/24/2021 2030   NITRITE NEGATIVE 01/24/2021 2030   [place] 01/24/2021 2030    Radiological Exams on Admission: DG Chest 2 View  Result Date: 01/24/2021 CLINICAL DATA:  Chest pain EXAM: CHEST - 2 VIEW COMPARISON:  Chest x-ray 01/18/2021, CT chest 10/07/2020 FINDINGS: The heart and mediastinal contours are within normal limits. No focal consolidation. No pulmonary edema. No pleural effusion. No pneumothorax. No acute osseous abnormality. IMPRESSION: No active cardiopulmonary disease. Electronically Signed   By: Iven Finn M.D.   On: 01/24/2021 18:21    EKG: Personally reviewed. Sinus tachycardia, rate 116, LVH, motion artifact.  Rate is faster when compared to prior.  Assessment/Plan Principal Problem:   Severe hyperglycemia due to diabetes mellitus ([place]) Active Problems:   Hypothyroidism   Chronic systolic CHF (congestive heart failure) ([facility])   Essential hypertension   Shakisha Abend is a 30 y.o. 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  Severe hyperglycemia in setting of type IIIc diabetes mellitus: Serum glucose 596 on admission.  UA without ketonuria, anion gap is 14. Secondary to inefficient insulin use per patient.  Started on insulin  infusion per protocol with gentle IV fluids given history of CHF.  Keep n.p.o. for  now.    Hyperkalemia: Continue insulin infusion and repeat labs in AM.  Acute kidney injury: Likely prerenal due to dehydration from severe hyperglycemia plus medication effect. -Continue gentle IV fluids as above -Holding Entresto, spironolactone  HFrEF: EF improved to 45-50% on most recent TTE.  Overall appears volume depleted on admission. -Monitor I/O's closely with IV fluid hydration -Holding Entresto, spironolactone, torsemide, digoxin -Continue ivabradine, Toprol-XL  Hypertension: Continue Toprol-XL.  Other meds on hold as above.  Hypothyroidism: Continue Synthroid  Chronic pancreatitis: May be having some worsening symptoms related to her hyperglycemia.  Lipase within normal limits.  Keep n.p.o., continue insulin infusion with IV fluid hydration as above.  Neuropathy: Continue renal dose Lyrica.  Hyperlipidemia: Home meds on hold for now.  History of astrocytoma: S/p resection and chemotherapy in childhood.  Mild residual left-sided weakness.  DVT prophylaxis: Subcutaneous heparin Code Status: Full code, confirmed with patient on admission Family Communication: Discussed with patient, husband asleep at bedside Disposition Plan: From home, dispo pending clinical progress Consults called: None Level of care: Stepdown Admission status:  Status is: Observation  The patient remains OBS appropriate and will d/c before 2 midnights.   Zada Finders MD [facility]  If 7PM-7AM, please contact night-coverage [email or web address]  01/25/2021, 12:22 AM

## 2021-01-26 ENCOUNTER — Inpatient Hospital Stay (HOSPITAL_COMMUNITY): Payer: BC Managed Care – PPO

## 2021-01-26 DIAGNOSIS — Z7982 Long term (current) use of aspirin: Secondary | ICD-10-CM | POA: Diagnosis not present

## 2021-01-26 DIAGNOSIS — G8194 Hemiplegia, unspecified affecting left nondominant side: Secondary | ICD-10-CM | POA: Diagnosis present

## 2021-01-26 DIAGNOSIS — Z8349 Family history of other endocrine, nutritional and metabolic diseases: Secondary | ICD-10-CM | POA: Diagnosis not present

## 2021-01-26 DIAGNOSIS — E1122 Type 2 diabetes mellitus with diabetic chronic kidney disease: Secondary | ICD-10-CM | POA: Diagnosis present

## 2021-01-26 DIAGNOSIS — R0781 Pleurodynia: Secondary | ICD-10-CM | POA: Diagnosis present

## 2021-01-26 DIAGNOSIS — E860 Dehydration: Secondary | ICD-10-CM | POA: Diagnosis present

## 2021-01-26 DIAGNOSIS — K859 Acute pancreatitis without necrosis or infection, unspecified: Secondary | ICD-10-CM | POA: Diagnosis present

## 2021-01-26 DIAGNOSIS — E875 Hyperkalemia: Secondary | ICD-10-CM | POA: Diagnosis present

## 2021-01-26 DIAGNOSIS — K760 Fatty (change of) liver, not elsewhere classified: Secondary | ICD-10-CM | POA: Diagnosis present

## 2021-01-26 DIAGNOSIS — Z83438 Family history of other disorder of lipoprotein metabolism and other lipidemia: Secondary | ICD-10-CM | POA: Diagnosis not present

## 2021-01-26 DIAGNOSIS — G629 Polyneuropathy, unspecified: Secondary | ICD-10-CM | POA: Diagnosis present

## 2021-01-26 DIAGNOSIS — I130 Hypertensive heart and chronic kidney disease with heart failure and stage 1 through stage 4 chronic kidney disease, or unspecified chronic kidney disease: Secondary | ICD-10-CM | POA: Diagnosis present

## 2021-01-26 DIAGNOSIS — I5022 Chronic systolic (congestive) heart failure: Secondary | ICD-10-CM | POA: Diagnosis present

## 2021-01-26 DIAGNOSIS — Z833 Family history of diabetes mellitus: Secondary | ICD-10-CM | POA: Diagnosis not present

## 2021-01-26 DIAGNOSIS — E1165 Type 2 diabetes mellitus with hyperglycemia: Secondary | ICD-10-CM | POA: Diagnosis present

## 2021-01-26 DIAGNOSIS — E871 Hypo-osmolality and hyponatremia: Secondary | ICD-10-CM | POA: Diagnosis present

## 2021-01-26 DIAGNOSIS — K861 Other chronic pancreatitis: Secondary | ICD-10-CM | POA: Diagnosis present

## 2021-01-26 DIAGNOSIS — Z20822 Contact with and (suspected) exposure to covid-19: Secondary | ICD-10-CM | POA: Diagnosis present

## 2021-01-26 DIAGNOSIS — N1831 Chronic kidney disease, stage 3a: Secondary | ICD-10-CM | POA: Diagnosis present

## 2021-01-26 DIAGNOSIS — Z8249 Family history of ischemic heart disease and other diseases of the circulatory system: Secondary | ICD-10-CM | POA: Diagnosis not present

## 2021-01-26 DIAGNOSIS — E872 Acidosis, unspecified: Secondary | ICD-10-CM | POA: Diagnosis present

## 2021-01-26 DIAGNOSIS — E781 Pure hyperglyceridemia: Secondary | ICD-10-CM | POA: Diagnosis present

## 2021-01-26 DIAGNOSIS — Z79899 Other long term (current) drug therapy: Secondary | ICD-10-CM | POA: Diagnosis not present

## 2021-01-26 DIAGNOSIS — E039 Hypothyroidism, unspecified: Secondary | ICD-10-CM | POA: Diagnosis present

## 2021-01-26 DIAGNOSIS — N179 Acute kidney failure, unspecified: Secondary | ICD-10-CM | POA: Diagnosis present

## 2021-01-26 LAB — BASIC METABOLIC PANEL
Anion gap: 13 (ref 5–15)
Anion gap: 12 (ref 5–15)
BUN: 58 mg/dL — ABNORMAL HIGH (ref 6–20)
BUN: 58 mg/dL — ABNORMAL HIGH (ref 6–20)
CO2: 19 mmol/L — ABNORMAL LOW (ref 22–32)
CO2: 21 mmol/L — ABNORMAL LOW (ref 22–32)
Calcium: 9.3 mg/dL (ref 8.9–10.3)
Calcium: 9.5 mg/dL (ref 8.9–10.3)
Chloride: 93 mmol/L — ABNORMAL LOW (ref 98–111)
Chloride: 94 mmol/L — ABNORMAL LOW (ref 98–111)
Creatinine, Ser: 1.76 mg/dL — ABNORMAL HIGH (ref 0.44–1.00)
Creatinine, Ser: 1.93 mg/dL — ABNORMAL HIGH (ref 0.44–1.00)
GFR, Estimated: 40 mL/min — ABNORMAL LOW (ref >60)
GFR, Estimated: 36 mL/min — ABNORMAL LOW (ref >60)
Glucose, Bld: 228 mg/dL — ABNORMAL HIGH (ref 70–99)
Glucose, Bld: 143 mg/dL — ABNORMAL HIGH (ref 70–99)
Potassium: 3.8 mmol/L (ref 3.5–5.1)
Potassium: 3.9 mmol/L (ref 3.5–5.1)
Sodium: 125 mmol/L — ABNORMAL LOW (ref 135–145)
Sodium: 127 mmol/L — ABNORMAL LOW (ref 135–145)

## 2021-01-26 LAB — GLUCOSE, CAPILLARY
Glucose-Capillary: 247 mg/dL — ABNORMAL HIGH (ref 70–99)
Glucose-Capillary: 153 mg/dL — ABNORMAL HIGH (ref 70–99)
Glucose-Capillary: 158 mg/dL — ABNORMAL HIGH (ref 70–99)
Glucose-Capillary: 216 mg/dL — ABNORMAL HIGH (ref 70–99)

## 2021-01-26 MED ORDER — OXYCODONE HCL 5 MG PO TABS
5.0000 mg | ORAL_TABLET | Freq: Four times a day (QID) | ORAL | Status: DC | PRN
Start: 2021-01-26 — End: 2021-01-27

## 2021-01-26 MED ORDER — PROMETHAZINE HCL 25 MG PO TABS: 25.0000 mg | ORAL_TABLET | Freq: Four times a day (QID) | ORAL | Status: DC | PRN

## 2021-01-26 MED ORDER — INSULIN GLARGINE-YFGN 100 UNIT/ML ~~LOC~~ SOLN
96.0000 [IU] | Freq: Two times a day (BID) | SUBCUTANEOUS | Status: DC
  Administered 2021-01-26 – 2021-01-27 (×3): 96 [IU] via SUBCUTANEOUS
  Filled 2021-01-26 (×4): qty 0.96

## 2021-01-26 MED ORDER — HYDROMORPHONE HCL 1 MG/ML IJ SOLN
0.5000 mg | INTRAMUSCULAR | Status: AC | PRN
  Administered 2021-01-26 (×2): 0.5 mg via INTRAVENOUS
  Filled 2021-01-26 (×2): qty 1

## 2021-01-26 MED ORDER — FUROSEMIDE 10 MG/ML IJ SOLN
40.0000 mg | Freq: Once | INTRAMUSCULAR | Status: AC
  Administered 2021-01-26: 40 mg via INTRAVENOUS
  Filled 2021-01-26: qty 4

## 2021-01-26 MED ORDER — SODIUM CHLORIDE 0.9 % IV SOLN: INTRAVENOUS | Status: DC

## 2021-01-26 MED ORDER — OXYCODONE-ACETAMINOPHEN 5-325 MG PO TABS
1.0000 | ORAL_TABLET | Freq: Four times a day (QID) | ORAL | Status: DC | PRN
  Administered 2021-01-26 – 2021-01-27 (×3): 1 via ORAL
  Filled 2021-01-26 (×3): qty 1

## 2021-01-26 NOTE — Consult Note (Signed)
Renal Service Consult Note [facility]  Kaydin Labo 01/26/2021 Sol Blazing, MD Requesting Physician: Dr. Lonny Prude  Reason for Consult: Renal failure HPI: The patient is a 30 y.o. year-old w/ hx of CHF, DM2, fatty liver, brain tumor surgery, HTN, pancreatitis, PCOS presented on 10/30 to ED w/ chest pain and abd pain for 1 night. Hx of CHF w/ EF 45-50%. In ED CXR was negative, CTA chest was done w/ no evidence of PE. Pt was treated w/ NS bolus, IV pain meds and antiemetics, IV insulin infusion for high BS 596. Pt has DM type IIIc on high dose lantus at home. Entresto , aldactone and torsemide were put on hold. Today IV insulin was dc'd and SQ insulin resumed. Na was 118 on admission, hx of low Na's. She rec'd NS 250 cc bolus and some LR at 50 cc/hr and some D5LR at 50 cc/hr.  Creat went up to 2.0 and down to 1.9 today, Na has improved from 118 to 124 yest and 127 today. Baseline creat is 1.4- 1.6.  Asked to see for renal failure.   Pt seen in room.  Said she learned about her CHF a few mos ago, her EF was 20% and now is better at 45-50%, also that she has high TG's because she can't absorb fats. Takes Creon enzymes.   Denies any current SOB. Abd pain getting better. No leg swelling.     ROS - denies CP, no joint pain, no HA, no blurry vision, no rash, no diarrhea, no nausea/ vomiting, no dysuria, no difficulty voiding   Past Medical History  Past Medical History:  Diagnosis Date   Brain tumor ([place]) 03/29/1995   astrocytoma   CHF (congestive heart failure) ([place])    Cholesterosis    DM (diabetes mellitus) ([place]) 10/10/2018   Fatty liver    HTN (hypertension) 10/10/2018   Hypothyroidism 10/10/2018   Lipoprotein deficiency    Lung disease    longevity long term   Pancreatitis    Polycystic ovary syndrome    Past Surgical History  Past Surgical History:  Procedure Laterality Date   ABDOMINAL SURGERY     pt states during miscarriage got her intestine   BRAIN  SURGERY     EYE MUSCLE SURGERY Right 03/28/2014   RIGHT/LEFT HEART CATH AND CORONARY ANGIOGRAPHY N/A 12/03/2020   Procedure: RIGHT/LEFT HEART CATH AND CORONARY ANGIOGRAPHY;  Surgeon: [name], MD;  Location: [facility];  Service: Cardiovascular;  Laterality: N/A;   VENTRICULOSTOMY  03/28/1997   Family History  Family History  Problem Relation Age of Onset   Diabetes Mother    Hypertension Mother    Hyperlipidemia Mother    Thyroid disease Mother    Hypertension Father    Diabetes Father    Social History  reports that she has never smoked. She has never used smokeless tobacco. She reports that she does not drink alcohol and does not use drugs. Allergies  Allergies  Allergen Reactions   Ketamine Other (See Comments)    In vegetative state for 15 minutes per pt   Clarithromycin Other (See Comments)    Stomach pain    Fentanyl Nausea And Vomiting   Penicillin V Itching    Gi upset   Shellfish Allergy Swelling and Other (See Comments)    Throat swelling. Pt states contrast in CT is okay   Morphine Itching and Rash   Penicillins Rash    Has patient had a PCN reaction causing immediate rash,  facial/tongue/throat swelling, SOB or lightheadedness with hypotension: Y Has patient had a PCN reaction causing severe rash involving mucus membranes or skin necrosis: Y Has patient had a PCN reaction that required hospitalization: N Has patient had a PCN reaction occurring within the last 10 years: Y If all of the above answers are "NO", then may proceed with Cephalosporin use.    Prednisone Rash   Home medications Prior to Admission medications   Medication Sig Start Date End Date Taking? Authorizing Provider  albuterol (VENTOLIN HFA) 108 (90 Base) MCG/ACT inhaler Inhale 2 puffs into the lungs every 6 (six) hours as needed for wheezing or shortness of breath. 12/31/18  Yes [provider]  aspirin EC 81 MG EC tablet Take 1 tablet (81 mg total) by mouth daily. Swallow  whole. 12/07/20  Yes Nicole Kindred A, DO  cholecalciferol (VITAMIN D) 25 MCG tablet Take 1 tablet (1,000 Units total) by mouth daily. 12/07/20  Yes Ezekiel Slocumb, DO  CRANBERRY FRUIT PO Take 1 capsule by mouth 2 (two) times a day.   Yes [provider]  Cyanocobalamin (VITAMIN B12 PO) Take 1 drop by mouth every morning. liquid   Yes [provider]  cyclobenzaprine (FLEXERIL) 10 MG tablet Take 10 mg by mouth daily as needed for muscle spasms. 11/19/18  Yes [provider]  digoxin (LANOXIN) 0.125 MG tablet Take 1 tablet (0.125 mg total) by mouth daily. 12/07/20  Yes Nicole Kindred A, DO  DULoxetine (CYMBALTA) 30 MG capsule Take 30 mg by mouth at bedtime. 01/28/19 12/16/21 Yes [provider]  Evolocumab (REPATHA) 140 MG/ML SOSY Inject 140 mg into the skin See admin instructions. Inject 140 mg subcutaneously every other Sunday evening   Yes [provider]  fenofibrate 160 MG tablet Take 1 tablet (160 mg total) by mouth daily. 12/07/20  Yes Nicole Kindred A, DO  fluticasone furoate-vilanterol (BREO ELLIPTA) 100-25 MCG/INH AEPB Inhale 1 puff into the lungs daily as needed (shortness of breath). 01/21/19  Yes [provider]  insulin glargine, 1 Unit Dial, (TOUJEO SOLOSTAR) 300 UNIT/ML Solostar Pen Inject 35 Units into the skin in the morning and at bedtime. Patient taking differently: Inject 95 Units into the skin in the morning and at bedtime. 12/06/20  Yes Nicole Kindred A, DO  ivabradine (CORLANOR) 5 MG TABS tablet Take 1 tablet (5 mg total) by mouth 2 (two) times daily with a meal. Patient taking differently: Take 5 mg by mouth daily. 12/06/20  Yes Ezekiel Slocumb, DO  levothyroxine (SYNTHROID) 125 MCG tablet Take 1 tablet (125 mcg total) by mouth daily at 6 (six) AM. 12/07/20  Yes Ezekiel Slocumb, DO  lipase/protease/amylase (CREON) 36000 UNITS CPEP capsule Take 1 capsule (36,000 Units total) by mouth 3 (three) times daily with  meals. Patient taking differently: Take 72,000 Units by mouth 3 (three) times daily with meals. Also take 1 capsule with snacks 12/06/20  Yes Nicole Kindred A, DO  metoprolol (TOPROL-XL) 200 MG 24 hr tablet Take 200 mg by mouth at bedtime.   Yes [provider]  metoprolol succinate (TOPROL-XL) 25 MG 24 hr tablet Take 25 mg by mouth every morning. 12/01/20  Yes [provider]  Multiple Vitamin (MULTIVITAMIN WITH MINERALS) TABS tablet Take 1 tablet by mouth daily. 12/07/20  Yes Nicole Kindred A, DO  NOVOLOG FLEXPEN 100 UNIT/ML FlexPen Inject 10-25 Units into the skin 3 (three) times daily. With meals.  Take 10 units PLUS 0-15 additional units per sliding scale instructions. Patient  taking differently: Inject 60 Units into the skin 3 (three) times daily before meals. 12/06/20  Yes Nicole Kindred A, DO  ondansetron (ZOFRAN) 4 MG tablet Take 4 mg by mouth every 8 (eight) hours as needed for nausea or vomiting. 12/19/18  Yes [provider]  pantoprazole (PROTONIX) 40 MG tablet Take 40 mg by mouth daily. 12/24/19  Yes [provider]  pregabalin (LYRICA) 100 MG capsule Take 200 mg by mouth in the morning. 10/09/20  Yes [provider]  pregabalin (LYRICA) 300 MG capsule Take 300 mg by mouth at bedtime.   Yes [provider]  prochlorperazine (COMPAZINE) 10 MG tablet Take 1 tablet (10 mg total) by mouth every 6 (six) hours as needed for nausea or vomiting. 01/18/21  Yes Molpus, John, MD  sacubitril-valsartan (ENTRESTO) 24-26 MG Take 1 tablet by mouth 2 (two) times daily. 01/06/21  Yes [place], Maricela Bo, FNP  spironolactone (ALDACTONE) 25 MG tablet Take 25 mg by mouth at bedtime.   Yes [provider]  torsemide (DEMADEX) 20 MG tablet Take 20 mg by mouth daily.   Yes [provider]  tretinoin (RETIN-A) 0.05 % cream Apply 1 application topically at bedtime. 01/17/21  Yes [provider]  empagliflozin (JARDIANCE) 10 MG TABS tablet  Take 10 mg by mouth daily. Patient not taking: Reported on 01/25/2021    [provider]  nitroGLYCERIN (NITROSTAT) 0.4 MG SL tablet Place 1 tablet (0.4 mg total) under the tongue every 5 (five) minutes as needed for up to 25 days for chest pain. 08/01/19 01/06/22  [name], MD  oxyCODONE-acetaminophen (PERCOCET) 10-325 MG tablet Take 1 tablet by mouth every 6 (six) hours as needed for pain. 01/18/21   Molpus, John, MD     Vitals:   01/26/21 0700 01/26/21 0800 01/26/21 1151 01/26/21 1200  BP:  103/79  93/62  Pulse: 72 77  69  Resp: 15 20  17   Temp:  (!) 97.5 F (36.4 C) 97.8 F (36.6 C)   TempSrc:  Axillary Oral   SpO2: 95% 97%  92%  Weight:      Height:       Exam Gen alert, no distress No rash, cyanosis or gangrene Sclera anicteric, throat clear and slightly dry  No jvd or bruits Chest clear bilat to bases, no rales/ wheezing RRR no MRG Abd soft ntnd no mass or ascites +bs GU defer MS no joint effusions or deformity Ext no LE or UE edema, no wounds or ulcers Neuro is alert, Ox 3 , nf         Home meds include - asa , digoxin, cymbalta, fenobibrate, breo ellipta, corlanor, synthroid, creon, toprol xl 225 mg daily, prns    Date   Creat   eGFR   2021   1.10- 1.24   Jan -jul 2022  0.9- 1.06   8/28- 12/06/20  1.00- 3.91  15- >60 ml/min, AKI episode   9/28- 12/25/20  1.46- 1.53  45- 50, stage IIIa    Oct 24  2.42   27 ml/min   Oct 30  1.71   41     Oct 31  1.79, 1.86, 2.01    Nov 1   1.76, 1.93  36- 40 ml/min     UA 10/30 - protein neg, 0-5 rbc/wbc   UNa 13, UCr 99    Assessment/ Plan: AKI on CKD 3a - b/l creat 1.4- 1.6, eGFR 45- 50 ml/min.  Pt admitted w/ abd pain and probable  recurrent pancreatitis w/ N/V and poor intake at home. Creat 1.7 on admission and 1.9 today, not far off from usual creat of 1.4- 1.6. UA is negative. Suspect this is volume depletion based on exam, clear CXR, low FeNa and soft BP's. Will resume IVF's at 65 cc/hr w/ NS and get renal  US. Cont to hold entresto. Will follow.  Hyponatremia - hypovolemic, has improved here w/ isotonic fluids only, from 118 to 127 today. As above will resume NS 0.9% at 65 cc/hr.  HFrEF - latest EF 45-50%.  Pt is not vol overloaded, cont to hold torsemide/ aldactone.  Hypertriglyceridemia Recurrent pancreatitis    Kelly Splinter  MD 01/26/2021, 3:32 PM  Recent Labs  Lab 01/24/21 1647  WBC 7.4  HGB 12.2   Recent Labs  Lab 01/26/21 0517 01/26/21 1116  K 3.8 3.9  BUN 58* 58*  CREATININE 1.76* 1.93*  CALCIUM 9.3 9.5

## 2021-01-26 NOTE — Progress Notes (Signed)
PROGRESS NOTE    Jennifer [name]  ZOX:[id number] DOB: Oct 05, 1990 DOA: 01/24/2021 PCP: Sue Lush, PA-C   Brief Narrative: Jennifer [name] is a 30 y.o. 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 Patient presented secondary to epigastric pain and found to have significant hyperglycemia requiring initiation of insulin drip, in addition to moderate-severe hyponatremia without symptoms.   Assessment & Plan:   Principal Problem:   Severe hyperglycemia due to diabetes mellitus (HCC) Active Problems:   Hypothyroidism   Chronic systolic CHF (congestive heart failure) (HCC)   Essential hypertension   AKI (acute kidney injury) ([place])   Severe hyperglycemia Diabetes mellitus, type 2 Patient did not meet criteria for DKA on admission. Patient takes insulin glargine 95 units BID as an outpatient in addition to Novolog meal coverage. Started on IV insulin with improvement of blood sugar and acidosis.  -Increase to home insuline glargine 96 units BID -Novolog 10 units TID with meals -SSI   Hyperkalemia Resolved with IV fluids   Hyponatremia Sodium of 118 on admission. Low even when corrected for hyperglycemia. Improved slightly. History of chronic hyponatremia. Patient with abdominal edema with some BLE edema. FENa suggests pre-renal cause. Concern this may actually be secondary to hypervolemia. Improvement with Lasix. Nephrology consulted secondary to development of worsening AKI after continued Lasix. -Nephrology recommendations: IV fluids   AKI Possibly secondary to poor perfusion in setting of fluid overload with underlying heart failure. Difficult to assess fluid status as patient states she does not accumulate fluid in her legs. Creatinine worsened with  continued Lasix. Nephrology consulted. -Nephrology recommendations: IV fluids   HFrEF Patient follows with heart failure clinic. EF of 45-50%. Patient is on Entresto, aldactone, Toprol-XL, digoxin, torsemide as an outpatient -Continue metoprolol and digoxin   Primary hypertension Patient is on Metoprolol, Entresto and Aldactone as an outpatient. Held at this time secondary to AKI. -Continue metoprolol   Hypothyroidism -Continue Synthroid   Chronic pancreatitis -Continue home Creon   Neuropathy -Continue home Lyrica, reduced for kidney dysfunction   Hyperlipidemia -Continue home fenofibrate   History astrocytoma -History of resection/chemotherapy in childhood. Residual left-sided weakness.   DVT prophylaxis: Heparin subq Code Status:   Code Status: Full Code Family Communication: None at bedside Disposition Plan: Discharge home in 24 hours pending improvement of AKI/sodium and nephrology recommendations   Consultants:  Nephrology  Procedures:  None  Antimicrobials: None    Subjective: Patient with no issues overnight.  Objective: Vitals:   01/26/21 0700 01/26/21 0800 01/26/21 1151 01/26/21 1200  BP:  103/79  93/62  Pulse: 72 77  69  Resp: 15 20  17   Temp:  (!) 97.5 F (36.4 C) 97.8 F (36.6 C)   TempSrc:  Axillary Oral   SpO2: 95% 97%  92%  Weight:      Height:        Intake/Output Summary (Last 24 hours) at 01/26/2021 1512 Last data filed at 01/26/2021 0300 Gross per 24 hour  Intake --  Output 1100 ml  Net -1100 ml   Filed Weights   01/24/21 1631 01/25/21 0300 01/26/21 0529  Weight: 57.6 kg 55.7 kg 56.3 kg    Examination:  General exam: Appears calm and comfortable  Respiratory system: Clear to auscultation. Respiratory effort normal. Cardiovascular system: S1 & S2 heard, RRR. No murmurs, rubs, gallops or clicks. Gastrointestinal system:  Abdomen is distended, soft and nontender. No organomegaly or masses felt. Normal bowel sounds  heard. Central nervous system: Alert and oriented. No focal neurological deficits. Musculoskeletal: No edema. No calf tenderness. Right leg is larger than left. Skin: No cyanosis. No rashes Psychiatry: Judgement and insight appear normal. Mood & affect appropriate.     Data Reviewed: I have personally reviewed following labs and imaging studies  CBC Lab Results  Component Value Date   WBC 7.4 01/24/2021   RBC 3.91 01/24/2021   HGB 12.2 01/24/2021   HCT 34.0 (L) 01/24/2021   MCV 87.0 01/24/2021   MCH 31.2 01/24/2021   PLT 231 01/24/2021   MCHC 35.9 01/24/2021   RDW 16.7 (H) 01/24/2021   LYMPHSABS 1.4 01/24/2021   MONOABS 0.4 01/24/2021   EOSABS 0.3 01/24/2021   BASOSABS 0.1 68/01/5725     Last metabolic panel Lab Results  Component Value Date   NA 127 (L) 01/26/2021   K 3.9 01/26/2021   CL 94 (L) 01/26/2021   CO2 21 (L) 01/26/2021   BUN 58 (H) 01/26/2021   CREATININE 1.93 (H) 01/26/2021   GLUCOSE 143 (H) 01/26/2021   GFRNONAA 36 (L) 01/26/2021   GFRAA 68 08/22/2019   CALCIUM 9.5 01/26/2021   PHOS 5.3 (H) 12/04/2020   PROT 7.6 01/24/2021   ALBUMIN 3.9 01/24/2021   BILITOT 1.2 01/24/2021   ALKPHOS 49 01/24/2021   AST 47 (H) 01/24/2021   ALT 30 01/24/2021   ANIONGAP 12 01/26/2021    CBG (last 3)  Recent Labs    01/25/21 2126 01/26/21 0734 01/26/21 1130  GLUCAP 162* 247* 153*     GFR: Estimated Creatinine Clearance: 32.9 mL/min (A) (by C-G formula based on SCr of 1.93 mg/dL (H)).  Coagulation Profile: No results for input(s): INR, PROTIME in the last 168 hours.  Recent Results (from the past 240 hour(s))  Resp Panel by RT-PCR (Flu A&B, Covid) Nasopharyngeal Swab     Status: None   Collection Time: 01/24/21  6:21 PM   Specimen: Nasopharyngeal Swab; Nasopharyngeal(NP) swabs in vial transport medium  Result Value Ref Range Status   SARS Coronavirus 2 by RT PCR NEGATIVE NEGATIVE Final    Comment: (NOTE) SARS-CoV-2 target nucleic acids are NOT  DETECTED.  The SARS-CoV-2 RNA is generally detectable in upper respiratory specimens during the acute phase of infection. The lowest concentration of SARS-CoV-2 viral copies this assay can detect is 138 copies/mL. A negative result does not preclude SARS-Cov-2 infection and should not be used as the sole basis for treatment or other patient management decisions. A negative result may occur with  improper specimen collection/handling, submission of specimen other than nasopharyngeal swab, presence of viral mutation(s) within the areas targeted by this assay, and inadequate number of viral copies(<138 copies/mL). A negative result must be combined with clinical observations, patient history, and epidemiological information. The expected result is Negative.  Fact Sheet for Patients:  EntrepreneurPulse.com.au  Fact Sheet for Healthcare Providers:  IncredibleEmployment.be  This test is no t yet approved or cleared by the [place] FDA and  has been authorized for detection and/or diagnosis of SARS-CoV-2 by FDA under an Emergency Use Authorization (EUA). This EUA will remain  in effect (meaning this test can be used) for the duration of the COVID-19 declaration under Section 564(b)(1) of the Act, 21 U.S.C.section 360bbb-3(b)(1), unless the authorization is terminated  or revoked sooner.       Influenza A by PCR NEGATIVE NEGATIVE Final   Influenza B by  PCR NEGATIVE NEGATIVE Final    Comment: (NOTE) The Xpert Xpress SARS-CoV-2/FLU/RSV plus assay is intended as an aid in the diagnosis of influenza from Nasopharyngeal swab specimens and should not be used as a sole basis for treatment. Nasal washings and aspirates are unacceptable for Xpert Xpress SARS-CoV-2/FLU/RSV testing.  Fact Sheet for Patients: EntrepreneurPulse.com.au  Fact Sheet for Healthcare Providers: IncredibleEmployment.be  This test is not yet  approved or cleared by the [place] FDA and has been authorized for detection and/or diagnosis of SARS-CoV-2 by FDA under an Emergency Use Authorization (EUA). This EUA will remain in effect (meaning this test can be used) for the duration of the COVID-19 declaration under Section 564(b)(1) of the Act, 21 U.S.C. section 360bbb-3(b)(1), unless the authorization is terminated or revoked.  Performed at [facility], [place] [address], [place], [place] [address]   MRSA Next Gen by PCR, Nasal     Status: None   Collection Time: 01/25/21  3:14 AM   Specimen: Nasal Mucosa; Nasal Swab  Result Value Ref Range Status   MRSA by PCR Next Gen NOT DETECTED NOT DETECTED Final    Comment: (NOTE) The GeneXpert MRSA Assay (FDA approved for NASAL specimens only), is one component of a comprehensive MRSA colonization surveillance program. It is not intended to diagnose MRSA infection nor to guide or monitor treatment for MRSA infections. Test performance is not FDA approved in patients less than 37 years old. Performed at [facility], [place] [address], [place], [place] [address]         Radiology Studies: DG Chest 2 View  Result Date: 01/24/2021 CLINICAL DATA:  Chest pain EXAM: CHEST - 2 VIEW COMPARISON:  Chest x-ray 01/18/2021, CT chest 10/07/2020 FINDINGS: The heart and mediastinal contours are within normal limits. No focal consolidation. No pulmonary edema. No pleural effusion. No pneumothorax. No acute osseous abnormality. IMPRESSION: No active cardiopulmonary disease. Electronically Signed   By: Iven Finn M.D.   On: 01/24/2021 18:21   CT Angio Chest PE W and/or Wo Contrast  Result Date: 01/25/2021 CLINICAL DATA:  Left chest pain, epigastric abdominal pain EXAM: CT ANGIOGRAPHY CHEST WITH CONTRAST TECHNIQUE: Multidetector CT imaging of the chest was performed using the standard protocol during bolus administration of intravenous contrast.  Multiplanar CT image reconstructions and MIPs were obtained to evaluate the vascular anatomy. CONTRAST:  73m OMNIPAQUE IOHEXOL 350 MG/ML SOLN COMPARISON:  None. FINDINGS: Cardiovascular: Adequate opacification of the pulmonary arterial tree. No intraluminal filling defect identified to suggest acute pulmonary embolism. Central pulmonary arteries are of normal caliber. Mild cardiomegaly with left ventricular dilation noted. No significant coronary artery calcification. No pericardial effusion. Thoracic aorta is unremarkable. Mediastinum/Nodes: The visualized thyroid is unremarkable. No pathologic thoracic adenopathy. Distal esophagus is fluid-filled which may relate to changes of gastroesophageal reflux or esophageal dysmotility. Lungs/Pleura: Scattered ground-glass opacity within the mid lung zones bilaterally is in keeping with probable atelectasis. No confluent pulmonary infiltrate. No pneumothorax or pleural effusion. Central airways are widely patent. Upper Abdomen: Moderate hepatic steatosis. The liver appears enlarged, but is not fully included on this examination. No acute abnormality. Musculoskeletal: No acute bone abnormality. Review of the MIP images confirms the above findings. IMPRESSION: No acute pulmonary embolism. Cardiomegaly with left ventricular dilation. Fluid-filled esophagus suggesting gastroesophageal reflux or esophageal dysmotility. Moderate hepatic steatosis. Suspected hepatomegaly, incompletely evaluated on this examination. Electronically Signed   By: AFidela SalisburyM.D.   On: 01/25/2021 02:23        Scheduled Meds:  aspirin EC  81 mg Oral Daily   Chlorhexidine Gluconate Cloth  6 each Topical Daily   DULoxetine  30 mg Oral QHS   fenofibrate  160 mg Oral Daily   heparin  5,000 Units Subcutaneous Q8H   insulin aspart  0-15 Units Subcutaneous TID WC   insulin aspart  10 Units Subcutaneous TID WC   insulin glargine-yfgn  96 Units Subcutaneous BID   ivabradine  5 mg Oral Daily    levothyroxine  125 mcg Oral Q0600   lipase/protease/amylase  72,000 Units Oral TID WC   metoprolol  200 mg Oral QHS   pregabalin  150 mg Oral QHS   And   pregabalin  100 mg Oral q morning   Continuous Infusions:   LOS: 0 days     Cordelia Poche, MD [facility] 01/26/2021, 3:12 PM  If 7PM-7AM, please contact night-coverage [email or web address]

## 2021-01-26 NOTE — Discharge Summary (Signed)
Physician Discharge Summary  Jennifer [name] [name] DOB: [birth year] DOA: 01/24/2021  PCP: Sue Lush, PA-C  Admit date: 01/24/2021 Discharge date: 01/27/2021  Admitted From: Home Disposition: home  Recommendations for Outpatient Follow-up:  Follow up with PCP in 1 week Please obtain BMP in 1-2 days Please follow up on the following pending results: None  Home Health: None Equipment/Devices: None  Discharge Condition: Stable CODE STATUS: Full code Diet recommendation: Carb modified   Brief/Interim Summary:  Admission HPI written by Zada Finders, MD   HPI: Jennifer [name] is a 30 y.o. 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  Patient states that she has a bad batch of insulin at home.  Over the last 2 days her CBGs have been >400 on her home monitoring kit.  She has been having increased nausea, vomiting, and epigastric pain from baseline.  She also reports new left-sided sharp chest discomfort that does not radiate to her arms, neck, jaw, back.  She states that she is been very thirsty and drinking a lot of fluids but has less urine output than she expects.   Hospital course:  Severe hyperglycemia Diabetes mellitus, type 2 Patient did not meet criteria for DKA on admission. Patient takes insulin glargine 95 units BID as an outpatient in addition to Novolog meal coverage. Started on IV insulin with improvement of blood sugar and acidosis. Transition to subcutaneous insulin. Continue home regimen on discharge.   Hyperkalemia Resolved with IV fluids   Hyponatremia Sodium of 118 on admission. Low even when corrected for hyperglycemia. Improved slightly. History of chronic  hyponatremia. Patient with abdominal edema with some BLE edema. FENa suggests pre-renal cause. Concern this may actually be secondary to hypervolemia. Improvement with Lasix. Nephrology consulted secondary to development of worsening AKI after continued Lasix. IV fluids restarted with worsening hyponatremia and sodium of 124. Recommendation for Tolvaptan, however patient declined and requested discharge with outpatient follow-up with outpatient physicians at [facility]. Will need outpatient BMP   AKI Possibly secondary to poor perfusion in setting of fluid overload with underlying heart failure. Difficult to assess fluid status as patient states she does not accumulate fluid in her legs. Creatinine worsened with continued Lasix. Nephrology consulted and restarted IV fluids with improvement of creatinine. Nephrology recommending to hold Entresto, spironolactone, torsemide for 2-3 days prior to restart.    HFrEF Patient follows with heart failure clinic. EF of 45-50%. Patient is on Entresto, aldactone, Toprol-XL, digoxin, torsemide as an outpatient. Medications held as mentioned above.   Primary hypertension Patient is on Metoprolol, Entresto and Aldactone as an outpatient. Held at this time secondary to AKI.   Hypothyroidism Continue Synthroid   Chronic pancreatitis Continue home Creon   Neuropathy Continue home Lyrica, reduced for kidney dysfunction   Hyperlipidemia Continue home fenofibrate   History astrocytoma History of resection/chemotherapy in childhood. Residual left-sided weakness.  Discharge Diagnoses:  Principal Problem:   Severe hyperglycemia due to diabetes mellitus (HCC) Active Problems:   Hypothyroidism   Chronic systolic CHF (congestive heart failure) ([place])   Essential hypertension   AKI (acute kidney injury) ([place])    Discharge Instructions   Allergies as of 01/27/2021       Reactions   Ketamine Other (See Comments)   In vegetative state for 15 minutes  per pt    Clarithromycin Other (See Comments)   Stomach pain   Fentanyl Nausea And Vomiting   Penicillin V Itching   Gi upset   Shellfish Allergy Swelling, Other (See Comments)   Throat swelling. Pt states contrast in CT is okay   Morphine Itching, Rash   Penicillins Rash   Has patient had a PCN reaction causing immediate rash, facial/tongue/throat swelling, SOB or lightheadedness with hypotension: Y Has patient had a PCN reaction causing severe rash involving mucus membranes or skin necrosis: Y Has patient had a PCN reaction that required hospitalization: N Has patient had a PCN reaction occurring within the last 10 years: Y If all of the above answers are "NO", then may proceed with Cephalosporin use.   Prednisone Rash        Medication List     STOP taking these medications    empagliflozin 10 MG Tabs tablet Commonly known as: JARDIANCE       TAKE these medications    albuterol 108 (90 Base) MCG/ACT inhaler Commonly known as: VENTOLIN HFA Inhale 2 puffs into the lungs every 6 (six) hours as needed for wheezing or shortness of breath.   aspirin 81 MG EC tablet Take 1 tablet (81 mg total) by mouth daily. Swallow whole.   Breo Ellipta 100-25 MCG/ACT Aepb Generic drug: fluticasone furoate-vilanterol Inhale 1 puff into the lungs daily as needed (shortness of breath).   CRANBERRY FRUIT PO Take 1 capsule by mouth 2 (two) times a day.   cyclobenzaprine 10 MG tablet Commonly known as: FLEXERIL Take 10 mg by mouth daily as needed for muscle spasms.   digoxin 0.125 MG tablet Commonly known as: LANOXIN Take 1 tablet (0.125 mg total) by mouth daily.   DULoxetine 30 MG capsule Commonly known as: CYMBALTA Take 30 mg by mouth at bedtime.   Entresto 24-26 MG Generic drug: sacubitril-valsartan Take 1 tablet by mouth 2 (two) times daily. HOLD FOR THE NEXT FEW DAYS What changed: additional instructions   fenofibrate 160 MG tablet Take 1 tablet (160 mg total) by mouth daily.    ivabradine 5 MG Tabs tablet Commonly known as: CORLANOR Take 1 tablet (5 mg total) by mouth 2 (two) times daily with a meal. What changed: when to take this   levothyroxine 125 MCG tablet Commonly known as: SYNTHROID Take 1 tablet (125 mcg total) by mouth daily at 6 (six) AM.   lipase/protease/amylase 36000 UNITS Cpep capsule Commonly known as: CREON Take 1 capsule (36,000 Units total) by mouth 3 (three) times daily with meals. What changed:  how much to take additional instructions   metoprolol 200 MG 24 hr tablet Commonly known as: TOPROL-XL Take 200 mg by mouth at bedtime. What changed: Another medication with the same name was removed. Continue taking this medication, and follow the directions you see here.   multivitamin with minerals Tabs tablet Take 1 tablet by mouth daily.   nitroGLYCERIN 0.4 MG SL tablet Commonly known as: NITROSTAT Place 1 tablet (0.4 mg total) under the tongue every 5 (five) minutes as needed for up to 25 days for chest pain.   NovoLOG FlexPen 100 UNIT/ML FlexPen Generic drug: insulin aspart Inject 60 Units into the skin 3 (three) times daily before meals.   ondansetron 4 MG tablet Commonly known as: ZOFRAN Take 4 mg by mouth every 8 (eight) hours as needed for nausea or vomiting.   oxyCODONE-acetaminophen 10-325 MG tablet Commonly known as: Percocet Take 1 tablet by mouth every 6 (six)  hours as needed for pain.   pantoprazole 40 MG tablet Commonly known as: PROTONIX Take 40 mg by mouth daily.   pregabalin 300 MG capsule Commonly known as: LYRICA Take 300 mg by mouth at bedtime.   pregabalin 100 MG capsule Commonly known as: LYRICA Take 200 mg by mouth in the morning.   prochlorperazine 10 MG tablet Commonly known as: COMPAZINE Take 1 tablet (10 mg total) by mouth every 6 (six) hours as needed for nausea or vomiting.   Repatha 140 MG/ML Sosy Generic drug: Evolocumab Inject 140 mg into the skin See admin instructions. Inject 140  mg subcutaneously every other Sunday evening   spironolactone 25 MG tablet Commonly known as: ALDACTONE Take 1 tablet (25 mg total) by mouth at bedtime. HOLD FOR THE NEXT FEW DAYS What changed: additional instructions   torsemide 20 MG tablet Commonly known as: DEMADEX Take 1 tablet (20 mg total) by mouth daily. HOLD FOR THE NEXT FEW DAYS What changed: additional instructions   Toujeo SoloStar 300 UNIT/ML Solostar Pen Generic drug: insulin glargine (1 Unit Dial) Inject 35 Units into the skin in the morning and at bedtime. What changed: how much to take   tretinoin 0.05 % cream Commonly known as: RETIN-A Apply 1 application topically at bedtime.   VITAMIN B12 PO Take 1 drop by mouth every morning. liquid   Vitamin D3 25 MCG tablet Commonly known as: Vitamin D Take 1 tablet (1,000 Units total) by mouth daily.        Allergies  Allergen Reactions   Ketamine Other (See Comments)    In vegetative state for 15 minutes per pt   Clarithromycin Other (See Comments)    Stomach pain    Fentanyl Nausea And Vomiting   Penicillin V Itching    Gi upset   Shellfish Allergy Swelling and Other (See Comments)    Throat swelling. Pt states contrast in CT is okay   Morphine Itching and Rash   Penicillins Rash    Has patient had a PCN reaction causing immediate rash, facial/tongue/throat swelling, SOB or lightheadedness with hypotension: Y Has patient had a PCN reaction causing severe rash involving mucus membranes or skin necrosis: Y Has patient had a PCN reaction that required hospitalization: N Has patient had a PCN reaction occurring within the last 10 years: Y If all of the above answers are "NO", then may proceed with Cephalosporin use.    Prednisone Rash    Consultations: Nephrology   Procedures/Studies: DG Chest 2 View  Result Date: 01/24/2021 CLINICAL DATA:  Chest pain EXAM: CHEST - 2 VIEW COMPARISON:  Chest x-ray 01/18/2021, CT chest 10/07/2020 FINDINGS: The heart  and mediastinal contours are within normal limits. No focal consolidation. No pulmonary edema. No pleural effusion. No pneumothorax. No acute osseous abnormality. IMPRESSION: No active cardiopulmonary disease. Electronically Signed   By: Iven Finn M.D.   On: 01/24/2021 18:21   DG Chest 2 View  Result Date: 01/18/2021 CLINICAL DATA:  Shortness of breath. EXAM: CHEST - 2 VIEW COMPARISON:  Chest radiograph dated 11/29/2020 FINDINGS: No focal consolidation, pleural effusion, or pneumothorax. The cardiac silhouette is within normal limits. No acute osseous pathology. IMPRESSION: No active cardiopulmonary disease. Electronically Signed   By: Anner Crete M.D.   On: 01/18/2021 19:51   CT Angio Chest PE W and/or Wo Contrast  Result Date: 01/25/2021 CLINICAL DATA:  Left chest pain, epigastric abdominal pain EXAM: CT ANGIOGRAPHY CHEST WITH CONTRAST TECHNIQUE: Multidetector CT imaging of the chest was performed  using the standard protocol during bolus administration of intravenous contrast. Multiplanar CT image reconstructions and MIPs were obtained to evaluate the vascular anatomy. CONTRAST:  4m OMNIPAQUE IOHEXOL 350 MG/ML SOLN COMPARISON:  None. FINDINGS: Cardiovascular: Adequate opacification of the pulmonary arterial tree. No intraluminal filling defect identified to suggest acute pulmonary embolism. Central pulmonary arteries are of normal caliber. Mild cardiomegaly with left ventricular dilation noted. No significant coronary artery calcification. No pericardial effusion. Thoracic aorta is unremarkable. Mediastinum/Nodes: The visualized thyroid is unremarkable. No pathologic thoracic adenopathy. Distal esophagus is fluid-filled which may relate to changes of gastroesophageal reflux or esophageal dysmotility. Lungs/Pleura: Scattered ground-glass opacity within the mid lung zones bilaterally is in keeping with probable atelectasis. No confluent pulmonary infiltrate. No pneumothorax or pleural  effusion. Central airways are widely patent. Upper Abdomen: Moderate hepatic steatosis. The liver appears enlarged, but is not fully included on this examination. No acute abnormality. Musculoskeletal: No acute bone abnormality. Review of the MIP images confirms the above findings. IMPRESSION: No acute pulmonary embolism. Cardiomegaly with left ventricular dilation. Fluid-filled esophagus suggesting gastroesophageal reflux or esophageal dysmotility. Moderate hepatic steatosis. Suspected hepatomegaly, incompletely evaluated on this examination. Electronically Signed   By: AFidela SalisburyM.D.   On: 01/25/2021 02:23   [place]NAL  Result Date: 01/26/2021 CLINICAL DATA:  Acute on chronic renal failure, underlying stage III A chronic kidney disease, hypertension, diabetes mellitus EXAM: RENAL / URINARY TRACT ULTRASOUND COMPLETE COMPARISON:  CT abdomen and pelvis 11/24/2020 FINDINGS: Right Kidney: Renal measurements: 11.0 x 5.6 x 6.7 cm = volume: 217 mL. Normal cortical thickness and echogenicity. No mass, hydronephrosis or shadowing calcification. Left Kidney: Renal measurements: 11.5 x 5.2 x 5.8 cm = volume: 182 mL. Normal cortical thickness and echogenicity. No mass, hydronephrosis or shadowing calcification. Bladder: Appears normal for degree of bladder distention. [place]. Attered debris within urinary bladder. BILATERAL ureteral jets visualized. Other: Echogenic hepatic parenchyma consistent with fatty infiltration as noted on prior CT. IMPRESSION: Normal sonographic appearance of the kidneys. Scattered debris within urinary bladder. Probable fatty infiltration of the liver Electronically Signed   By: MLavonia DanaM.D.   On: 01/26/2021 18:16   ECHOCARDIOGRAM COMPLETE  Result Date: 01/11/2021    ECHOCARDIOGRAM REPORT   Patient Name:   Jennifer TEATDate of Exam: 01/11/2021 Medical Rec #:  [id number]        Height:       59.0 in Accession #:    [id number]       Weight:       134.4 lb Date of Birth:  [birth year]         BSA:          1.558 m Patient Age:    30 years          BP:           124/79 mmHg Patient Gender: F                 HR:           92 bpm. Exam Location:  Outpatient Procedure: 2D Echo, Color Doppler and Cardiac Doppler Indications:    Congestive Heart Failure I50.9  History:        Patient has prior history of Echocardiogram examinations, most                 recent 11/28/2020. CHF; Risk Factors:Hypertension and Diabetes.  Sonographer:    SBernadene PersonRDCS Referring Phys: [email or web address] 1. Left ventricular  ejection fraction, by estimation, is 45 to 50%. The left ventricle has mildly decreased function. The left ventricle has no regional wall motion abnormalities. There is mild asymmetric left ventricular hypertrophy of the septal segment. Left ventricular diastolic parameters are consistent with Grade II diastolic dysfunction (pseudonormalization). There is mild hypokinesis of the left ventricular, basal anteroseptal wall and inferoseptal wall.  2. Right ventricular systolic function is normal. The right ventricular size is normal. There is normal pulmonary artery systolic pressure.  3. The mitral valve is normal in structure. Mild mitral valve regurgitation. No evidence of mitral stenosis.  4. The aortic valve is tricuspid. Aortic valve regurgitation is mild. No aortic stenosis is present.  5. The inferior vena cava is normal in size with greater than 50% respiratory variability, suggesting right atrial pressure of 3 mmHg. Comparison(s): No prior Echocardiogram. FINDINGS  Left Ventricle: Left ventricular ejection fraction, by estimation, is 45 to 50%. The left ventricle has mildly decreased function. The left ventricle has no regional wall motion abnormalities. Mild hypokinesis of the left ventricular, basal anteroseptal  wall and inferoseptal wall. The left ventricular internal cavity size was normal in size. There is mild asymmetric left ventricular hypertrophy of the septal segment.  Left ventricular diastolic parameters are consistent with Grade II diastolic dysfunction (pseudonormalization).  LV Wall Scoring: The basal anteroseptal segment and basal inferoseptal segment are hypokinetic. Right Ventricle: The right ventricular size is normal. No increase in right ventricular wall thickness. Right ventricular systolic function is normal. There is normal pulmonary artery systolic pressure. The tricuspid regurgitant velocity is 2.78 m/s, and  with an assumed right atrial pressure of 3 mmHg, the estimated right ventricular systolic pressure is 42.3 mmHg. Left Atrium: Left atrial size was normal in size. Right Atrium: Right atrial size was normal in size. Pericardium: There is no evidence of pericardial effusion. Mitral Valve: The mitral valve is normal in structure. Mild mitral valve regurgitation. No evidence of mitral valve stenosis. Tricuspid Valve: The tricuspid valve is normal in structure. Tricuspid valve regurgitation is trivial. No evidence of tricuspid stenosis. Aortic Valve: The aortic valve is tricuspid. Aortic valve regurgitation is mild. Aortic regurgitation PHT measures 617 msec. No aortic stenosis is present. Pulmonic Valve: The pulmonic valve was normal in structure. Pulmonic valve regurgitation is not visualized. No evidence of pulmonic stenosis. Aorta: The aortic root is normal in size and structure. Venous: The inferior vena cava is normal in size with greater than 50% respiratory variability, suggesting right atrial pressure of 3 mmHg. IAS/Shunts: No atrial level shunt detected by color flow Doppler.  LEFT VENTRICLE PLAX 2D LVIDd:         4.30 cm     Diastology LVIDs:         3.00 cm     LV e' medial:    5.70 cm/s LV PW:         0.90 cm     LV E/e' medial:  15.5 LV IVS:        1.10 cm     LV e' lateral:   10.20 cm/s LVOT diam:     1.80 cm     LV E/e' lateral: 8.7 LV SV:         55 LV SV Index:   35 LVOT Area:     2.54 cm  LV Volumes (MOD) LV vol d, MOD A2C: 83.2 ml LV vol d, MOD  A4C: 83.3 ml LV vol s, MOD A2C: 45.5 ml LV vol s, MOD A4C: 46.1 ml  LV SV MOD A2C:     37.7 ml LV SV MOD A4C:     83.3 ml LV SV MOD BP:      39.0 ml RIGHT VENTRICLE RV S prime:     13.70 cm/s TAPSE (M-mode): 1.7 cm LEFT ATRIUM             Index        RIGHT ATRIUM          Index LA diam:        2.50 cm 1.60 cm/m   RA Area:     9.11 cm LA Vol (A2C):   22.3 ml 14.32 ml/m  RA Volume:   16.50 ml 10.59 ml/m LA Vol (A4C):   24.4 ml 15.66 ml/m LA Biplane Vol: 23.9 ml 15.34 ml/m  AORTIC VALVE LVOT Vmax:   124.00 cm/s LVOT Vmean:  92.400 cm/s LVOT VTI:    0.216 m AI PHT:      617 msec  AORTA Ao Asc diam: 2.80 cm MITRAL VALVE               TRICUSPID VALVE MV Area (PHT): 5.38 cm    TR Peak grad:   30.9 mmHg MV Decel Time: 141 msec    TR Vmax:        278.00 cm/s MV E velocity: 88.30 cm/s MV A velocity: 60.80 cm/s  SHUNTS MV E/A ratio:  1.45        Systemic VTI:  0.22 m                            Systemic Diam: 1.80 cm Kardie Tobb DO Electronically signed by Berniece Salines DO Signature Date/Time: 01/11/2021/11:49:41 AM    Final       Subjective: No issues overnight.  Discharge Exam: Vitals:   01/27/21 1500 01/27/21 1530  BP:    Pulse: 71   Resp: (!) 25   Temp:  98 F (36.7 C)  SpO2: 96%    Vitals:   01/27/21 1300 01/27/21 1400 01/27/21 1500 01/27/21 1530  BP:  139/80    Pulse: 69 72 71   Resp: 17 17 (!) 25   Temp:    98 F (36.7 C)  TempSrc:    Oral  SpO2: 98% 100% 96%   Weight:      Height:        General: Pt is alert, awake, not in acute distress Cardiovascular: RRR, S1/S2 +, no rubs, no gallops Respiratory: CTA bilaterally, no wheezing, no rhonchi Abdominal: Soft, NT, ND, bowel sounds + Extremities: no edema, no cyanosis    The results of significant diagnostics from this hospitalization (including imaging, microbiology, ancillary and laboratory) are listed below for reference.     Microbiology: Recent Results (from the past 240 hour(s))  Resp Panel by RT-PCR (Flu A&B, Covid)  Nasopharyngeal Swab     Status: None   Collection Time: 01/24/21  6:21 PM   Specimen: Nasopharyngeal Swab; Nasopharyngeal(NP) swabs in vial transport medium  Result Value Ref Range Status   SARS Coronavirus 2 by RT PCR NEGATIVE NEGATIVE Final    Comment: (NOTE) SARS-CoV-2 target nucleic acids are NOT DETECTED.  The SARS-CoV-2 RNA is generally detectable in upper respiratory specimens during the acute phase of infection. The lowest concentration of SARS-CoV-2 viral copies this assay can detect is 138 copies/mL. A negative result does not preclude SARS-Cov-2 infection and should not be used as the sole basis for treatment  or other patient management decisions. A negative result may occur with  improper specimen collection/handling, submission of specimen other than nasopharyngeal swab, presence of viral mutation(s) within the areas targeted by this assay, and inadequate number of viral copies(<138 copies/mL). A negative result must be combined with clinical observations, patient history, and epidemiological information. The expected result is Negative.  Fact Sheet for Patients:  EntrepreneurPulse.com.au  Fact Sheet for Healthcare Providers:  IncredibleEmployment.be  This test is no t yet approved or cleared by the [place] FDA and  has been authorized for detection and/or diagnosis of SARS-CoV-2 by FDA under an Emergency Use Authorization (EUA). This EUA will remain  in effect (meaning this test can be used) for the duration of the COVID-19 declaration under Section 564(b)(1) of the Act, 21 U.S.C.section 360bbb-3(b)(1), unless the authorization is terminated  or revoked sooner.       Influenza A by PCR NEGATIVE NEGATIVE Final   Influenza B by PCR NEGATIVE NEGATIVE Final    Comment: (NOTE) The Xpert Xpress SARS-CoV-2/FLU/RSV plus assay is intended as an aid in the diagnosis of influenza from Nasopharyngeal swab specimens and should not be  used as a sole basis for treatment. Nasal washings and aspirates are unacceptable for Xpert Xpress SARS-CoV-2/FLU/RSV testing.  Fact Sheet for Patients: EntrepreneurPulse.com.au  Fact Sheet for Healthcare Providers: IncredibleEmployment.be  This test is not yet approved or cleared by the [place] FDA and has been authorized for detection and/or diagnosis of SARS-CoV-2 by FDA under an Emergency Use Authorization (EUA). This EUA will remain in effect (meaning this test can be used) for the duration of the COVID-19 declaration under Section 564(b)(1) of the Act, 21 U.S.C. section 360bbb-3(b)(1), unless the authorization is terminated or revoked.  Performed at [facility], [place] [address], [place], [place] [address]   MRSA Next Gen by PCR, Nasal     Status: None   Collection Time: 01/25/21  3:14 AM   Specimen: Nasal Mucosa; Nasal Swab  Result Value Ref Range Status   MRSA by PCR Next Gen NOT DETECTED NOT DETECTED Final    Comment: (NOTE) The GeneXpert MRSA Assay (FDA approved for NASAL specimens only), is one component of a comprehensive MRSA colonization surveillance program. It is not intended to diagnose MRSA infection nor to guide or monitor treatment for MRSA infections. Test performance is not FDA approved in patients less than 24 years old. Performed at [facility], [place] [address] [place], [place] [address]      Labs: BNP (last 3 results) Recent Labs    12/25/20 1100 01/18/21 1752 01/24/21 1647  BNP 42.9 54.2 74.2   Basic Metabolic Panel: Recent Labs  Lab 01/25/21 1705 01/25/21 2226 01/26/21 0517 01/26/21 1116 01/27/21 0249  NA 127* 127* 125* 127* 124*  K 4.2 4.0 3.8 3.9 4.4  CL 95* 95* 93* 94* 95*  CO2 19* 20* 19* 21* 18*  GLUCOSE 198* 189* 228* 143* 211*  BUN 54* 58* 58* 58* 61*  CREATININE 2.01* 2.04* 1.76* 1.93* 1.76*  CALCIUM 9.5 9.4 9.3 9.5 8.7*   Liver Function  Tests: Recent Labs  Lab 01/24/21 2029  AST 47*  ALT 30  ALKPHOS 49  BILITOT 1.2  PROT 7.6  ALBUMIN 3.9   Recent Labs  Lab 01/24/21 2029  LIPASE 27   No results for input(s): AMMONIA in the last 168 hours. CBC: Recent Labs  Lab 01/24/21 1647  WBC 7.4  NEUTROABS 5.2  HGB 12.2  HCT 34.0*  MCV 87.0  PLT 231   Cardiac Enzymes: No results for input(s): CKTOTAL, CKMB, CKMBINDEX, TROPONINI in the last 168 hours. BNP: Invalid input(s): POCBNP CBG: Recent Labs  Lab 01/26/21 2126 01/27/21 0805 01/27/21 1125 01/27/21 1304 01/27/21 1609  GLUCAP 216* 104* 63* 136* 284*   D-Dimer No results for input(s): DDIMER in the last 72 hours. Hgb A1c Recent Labs    01/25/21 0317  HGBA1C 8.9*   Lipid Profile No results for input(s): CHOL, HDL, LDLCALC, TRIG, CHOLHDL, LDLDIRECT in the last 72 hours. Thyroid function studies No results for input(s): TSH, T4TOTAL, T3FREE, THYROIDAB in the last 72 hours.  Invalid input(s): FREET3 Anemia work up No results for input(s): VITAMINB12, FOLATE, FERRITIN, TIBC, IRON, RETICCTPCT in the last 72 hours. Urinalysis    Component Value Date/Time   COLORURINE STRAW (A) 01/24/2021 2030   APPEARANCEUR CLEAR 01/24/2021 2030   LABSPEC 1.020 01/24/2021 2030   PHURINE 5.0 01/24/2021 2030   GLUCOSEU >=500 (A) 01/24/2021 2030   HGBUR SMALL (A) 01/24/2021 2030   [place] NEGATIVE 01/24/2021 2030   [place] 01/24/2021 2030   PROTEINUR NEGATIVE 01/24/2021 2030   NITRITE NEGATIVE 01/24/2021 2030   LEUKOCYTESUR NEGATIVE 01/24/2021 2030   Sepsis Labs Invalid input(s): PROCALCITONIN,  WBC,  LACTICIDVEN Microbiology Recent Results (from the past 240 hour(s))  Resp Panel by RT-PCR (Flu A&B, Covid) Nasopharyngeal Swab     Status: None   Collection Time: 01/24/21  6:21 PM   Specimen: Nasopharyngeal Swab; Nasopharyngeal(NP) swabs in vial transport medium  Result Value Ref Range Status   SARS Coronavirus 2 by RT PCR NEGATIVE NEGATIVE  Final    Comment: (NOTE) SARS-CoV-2 target nucleic acids are NOT DETECTED.  The SARS-CoV-2 RNA is generally detectable in upper respiratory specimens during the acute phase of infection. The lowest concentration of SARS-CoV-2 viral copies this assay can detect is 138 copies/mL. A negative result does not preclude SARS-Cov-2 infection and should not be used as the sole basis for treatment or other patient management decisions. A negative result may occur with  improper specimen collection/handling, submission of specimen other than nasopharyngeal swab, presence of viral mutation(s) within the areas targeted by this assay, and inadequate number of viral copies(<138 copies/mL). A negative result must be combined with clinical observations, patient history, and epidemiological information. The expected result is Negative.  Fact Sheet for Patients:  EntrepreneurPulse.com.au  Fact Sheet for Healthcare Providers:  IncredibleEmployment.be  This test is no t yet approved or cleared by the [place] FDA and  has been authorized for detection and/or diagnosis of SARS-CoV-2 by FDA under an Emergency Use Authorization (EUA). This EUA will remain  in effect (meaning this test can be used) for the duration of the COVID-19 declaration under Section 564(b)(1) of the Act, 21 U.S.C.section 360bbb-3(b)(1), unless the authorization is terminated  or revoked sooner.       Influenza A by PCR NEGATIVE NEGATIVE Final   Influenza B by PCR NEGATIVE NEGATIVE Final    Comment: (NOTE) The Xpert Xpress SARS-CoV-2/FLU/RSV plus assay is intended as an aid in the diagnosis of influenza from Nasopharyngeal swab specimens and should not be used as a sole basis for treatment. Nasal washings and aspirates are unacceptable for Xpert Xpress SARS-CoV-2/FLU/RSV testing.  Fact Sheet for Patients: EntrepreneurPulse.com.au  Fact Sheet for Healthcare  Providers: IncredibleEmployment.be  This test is not yet approved or cleared by the [place] FDA and has been authorized for detection and/or diagnosis of SARS-CoV-2 by FDA under an Emergency Use Authorization (  EUA). This EUA will remain in effect (meaning this test can be used) for the duration of the COVID-19 declaration under Section 564(b)(1) of the Act, 21 U.S.C. section 360bbb-3(b)(1), unless the authorization is terminated or revoked.  Performed at [facility], [place] [address] [place], [place] [address]   MRSA Next Gen by PCR, Nasal     Status: None   Collection Time: 01/25/21  3:14 AM   Specimen: Nasal Mucosa; Nasal Swab  Result Value Ref Range Status   MRSA by PCR Next Gen NOT DETECTED NOT DETECTED Final    Comment: (NOTE) The GeneXpert MRSA Assay (FDA approved for NASAL specimens only), is one component of a comprehensive MRSA colonization surveillance program. It is not intended to diagnose MRSA infection nor to guide or monitor treatment for MRSA infections. Test performance is not FDA approved in patients less than 54 years old. Performed at [facility], [place] [address] [place], [place] [address]      Time coordinating discharge: 35 minutes  SIGNED:   Cordelia Poche, MD [facility] 01/27/2021, 4:42 PM

## 2021-01-26 NOTE — Plan of Care (Signed)
  Problem: Clinical Measurements: Goal: Ability to maintain clinical measurements within normal limits will improve Outcome: Progressing Goal: Will remain free from infection Outcome: Progressing Goal: Respiratory complications will improve Outcome: Progressing   Problem: Activity: Goal: Risk for activity intolerance will decrease Outcome: Progressing   Problem: Nutrition: Goal: Adequate nutrition will be maintained Outcome: Progressing   Problem: Clinical Measurements: Goal: Diagnostic test results will improve Outcome: Not Progressing

## 2021-01-27 ENCOUNTER — Encounter (HOSPITAL_COMMUNITY): Payer: BC Managed Care – PPO

## 2021-01-27 DIAGNOSIS — E1165 Type 2 diabetes mellitus with hyperglycemia: Secondary | ICD-10-CM | POA: Diagnosis not present

## 2021-01-27 LAB — BASIC METABOLIC PANEL
Anion gap: 11 (ref 5–15)
BUN: 61 mg/dL — ABNORMAL HIGH (ref 6–20)
CO2: 18 mmol/L — ABNORMAL LOW (ref 22–32)
Calcium: 8.7 mg/dL — ABNORMAL LOW (ref 8.9–10.3)
Chloride: 95 mmol/L — ABNORMAL LOW (ref 98–111)
Creatinine, Ser: 1.76 mg/dL — ABNORMAL HIGH (ref 0.44–1.00)
GFR, Estimated: 40 mL/min — ABNORMAL LOW (ref >60)
Glucose, Bld: 211 mg/dL — ABNORMAL HIGH (ref 70–99)
Potassium: 4.4 mmol/L (ref 3.5–5.1)
Sodium: 124 mmol/L — ABNORMAL LOW (ref 135–145)

## 2021-01-27 LAB — GLUCOSE, CAPILLARY
Glucose-Capillary: 104 mg/dL — ABNORMAL HIGH (ref 70–99)
Glucose-Capillary: 63 mg/dL — ABNORMAL LOW (ref 70–99)
Glucose-Capillary: 136 mg/dL — ABNORMAL HIGH (ref 70–99)
Glucose-Capillary: 284 mg/dL — ABNORMAL HIGH (ref 70–99)

## 2021-01-27 LAB — HEPATIC FUNCTION PANEL
ALT: 47 U/L — ABNORMAL HIGH (ref 0–44)
AST: 92 U/L — ABNORMAL HIGH (ref 15–41)
Albumin: 3.6 g/dL (ref 3.5–5.0)
Alkaline Phosphatase: 46 U/L (ref 38–126)
Bilirubin, Direct: 0.3 mg/dL — ABNORMAL HIGH (ref 0.0–0.2)
Indirect Bilirubin: 0.5 mg/dL (ref 0.3–0.9)
Total Bilirubin: 0.8 mg/dL (ref 0.3–1.2)
Total Protein: 6.6 g/dL (ref 6.5–8.1)

## 2021-01-27 MED ORDER — TOLVAPTAN 15 MG PO TABS
15.0000 mg | ORAL_TABLET | Freq: Once | ORAL | Status: DC
  Filled 2021-01-27: qty 1

## 2021-01-27 MED ORDER — NOVOLOG FLEXPEN 100 UNIT/ML ~~LOC~~ SOPN: 60.0000 [IU] | PEN_INJECTOR | Freq: Three times a day (TID) | SUBCUTANEOUS | 2 refills | Status: DC

## 2021-01-27 MED ORDER — TORSEMIDE 20 MG PO TABS: 20.0000 mg | ORAL_TABLET | Freq: Every day | ORAL | Status: DC

## 2021-01-27 MED ORDER — ENTRESTO 24-26 MG PO TABS: 1.0000 | ORAL_TABLET | Freq: Two times a day (BID) | ORAL | 11 refills | Status: DC

## 2021-01-27 MED ORDER — SPIRONOLACTONE 25 MG PO TABS: 25.0000 mg | ORAL_TABLET | Freq: Every evening | ORAL | Status: DC

## 2021-01-27 NOTE — Progress Notes (Signed)
RN went over AVS discharge instructions with the patient. All personal belongings have been returned to the patient.

## 2021-01-27 NOTE — Progress Notes (Addendum)
[facility] Progress Note  Subjective: seen in room, creat down 1.7, Na down 124  Vitals:   01/27/21 0700 01/27/21 0800 01/27/21 0900 01/27/21 1200  BP:  125/78    Pulse: 70 71 71   Resp: 15 (!) 27 15   Temp: 97.8 F (36.6 C)   98.1 F (36.7 C)  TempSrc: Oral   Oral  SpO2: 97% 98% 100%   Weight:      Height:        Exam:  alert, nad   no jvd  Chest cta bilat  Cor reg no RG  Abd soft ntnd no ascites   Ext no LE edema   Alert, NF, ox3   Home meds include - asa , digoxin, cymbalta, fenobibrate, breo ellipta, corlanor, synthroid, creon, toprol xl 225 mg daily, prns     Date                           Creat                           eGFR   2021                          1.10- 1.24   Jan -jul 2022             0.9- 1.06   8/28- 12/06/20             1.00- 3.91                    15- >60 ml/min, AKI episode   9/28- 12/25/20             1.46- 1.53                    45- 50, stage IIIa            Oct 24                       2.42                             27 ml/min   Oct 30                       1.71                             41     Oct 31                      1.79, 1.86, 2.01              Nov 1                         1.76, 1.93                    36- 40 ml/min                           UA 10/30 - protein neg, 0-5 rbc/wbc   UNa 13, UCr 99       Assessment/ Plan: AKI on CKD 3a -  b/l creat 1.4- 1.6, eGFR 45- 50 ml/min.  Pt admitted w/ abd pain and probable recurrent pancreatitis w/ N/V and poor intake at home. Creat 1.7 on admission and 1.9 today, not far off from usual creat of 1.4- 1.6. UA is negative. Suspect this is volume depletion based on exam, clear CXR, low FeNa and soft BP's. Renal US w/o any signs of obstruction. Creat down 1.7 w/ IVF's. Have dc'd IVF"s. [name] close to baseline renal function and looks euvolemic.  Hyponatremia - Na 118 improved to 127 w/ NS 0.9% which was c/w vol depletion. Now dropped to 124 after giving more 0.9% saline overnight. I  suggested tolvaptan but pt wants to be dc'd today so she can see her endocrine MD at [facility] tomorrow. OK w/ dc from renal standpoint.  HFrEF - latest EF 45-50%.  Not vol overloaded, would hold torsemide/ aldactone for a few more days then resume when eating/ drinking at home in 2-3 days.  Hypertriglyceridemia Recurrent pancreatitis       Rob Schertz 01/27/2021, 1:51 PM   Recent Labs  Lab 01/24/21 1647 01/24/21 2029 01/26/21 1116 01/27/21 0249  K  --    < > 3.9 4.4  BUN  --    < > 58* 61*  CREATININE  --    < > 1.93* 1.76*  CALCIUM  --    < > 9.5 8.7*  HGB 12.2  --   --   --    < > = values in this interval not displayed.   Inpatient medications:  aspirin EC  81 mg Oral Daily   Chlorhexidine Gluconate Cloth  6 each Topical Daily   DULoxetine  30 mg Oral QHS   fenofibrate  160 mg Oral Daily   heparin  5,000 Units Subcutaneous Q8H   insulin aspart  0-15 Units Subcutaneous TID WC   insulin aspart  10 Units Subcutaneous TID WC   insulin glargine-yfgn  96 Units Subcutaneous BID   ivabradine  5 mg Oral Daily   levothyroxine  125 mcg Oral Q0600   lipase/protease/amylase  72,000 Units Oral TID WC   metoprolol  200 mg Oral QHS   pregabalin  150 mg Oral QHS   And   pregabalin  100 mg Oral q morning    albuterol, cyclobenzaprine, dextrose, fluticasone furoate-vilanterol, lipase/protease/amylase, metoCLOPramide (REGLAN) injection, ondansetron (ZOFRAN) IV, oxyCODONE-acetaminophen **AND** oxyCODONE, promethazine       

## 2021-01-27 NOTE — Plan of Care (Signed)
  Problem: Education: Goal: Knowledge of General Education information will improve Description: Including pain rating scale, medication(s)/side effects and non-pharmacologic comfort measures Outcome: Adequate for Discharge   Problem: Health Behavior/Discharge Planning: Goal: Ability to manage health-related needs will improve Outcome: Adequate for Discharge   Problem: Clinical Measurements: Goal: Ability to maintain clinical measurements within normal limits will improve Outcome: Adequate for Discharge Goal: Will remain free from infection Outcome: Adequate for Discharge Goal: Diagnostic test results will improve Outcome: Adequate for Discharge Goal: Respiratory complications will improve Outcome: Adequate for Discharge Goal: Cardiovascular complication will be avoided Outcome: Adequate for Discharge   Problem: Activity: Goal: Risk for activity intolerance will decrease Outcome: Adequate for Discharge   Problem: Nutrition: Goal: Adequate nutrition will be maintained Outcome: Adequate for Discharge   Problem: Coping: Goal: Level of anxiety will decrease Outcome: Adequate for Discharge   Problem: Elimination: Goal: Will not experience complications related to bowel motility Outcome: Adequate for Discharge Goal: Will not experience complications related to urinary retention Outcome: Adequate for Discharge   Problem: Pain Managment: Goal: General experience of comfort will improve Outcome: Adequate for Discharge   Problem: Safety: Goal: Ability to remain free from injury will improve Outcome: Adequate for Discharge   Problem: Skin Integrity: Goal: Risk for impaired skin integrity will decrease Outcome: Adequate for Discharge   Problem: Education: Goal: Ability to describe self-care measures that may prevent or decrease complications (Diabetes Survival Skills Education) will improve Outcome: Adequate for Discharge Goal: Individualized Educational Video(s) Outcome:  Adequate for Discharge   Problem: Cardiac: Goal: Ability to maintain an adequate cardiac output will improve Outcome: Adequate for Discharge   Problem: Health Behavior/Discharge Planning: Goal: Ability to identify and utilize available resources and services will improve Outcome: Adequate for Discharge Goal: Ability to manage health-related needs will improve Outcome: Adequate for Discharge   Problem: Fluid Volume: Goal: Ability to achieve a balanced intake and output will improve Outcome: Adequate for Discharge   Problem: Metabolic: Goal: Ability to maintain appropriate glucose levels will improve Outcome: Adequate for Discharge   Problem: Nutritional: Goal: Maintenance of adequate nutrition will improve Outcome: Adequate for Discharge Goal: Maintenance of adequate weight for body size and type will improve Outcome: Adequate for Discharge   Problem: Respiratory: Goal: Will regain and/or maintain adequate ventilation Outcome: Adequate for Discharge   Problem: Urinary Elimination: Goal: Ability to achieve and maintain adequate renal perfusion and functioning will improve Outcome: Adequate for Discharge

## 2021-01-27 NOTE — Discharge Instructions (Signed)
Jennifer [name],  You were in the hospital with high blood sugar. This was treated with IV insulin and has improved. You also had worsening of your low sodium. We tried to correct this but were unable to get to your normal. You have requested discharge rather than to continue treatment. Nephrology has recommended that you hold on taking your spironolactone and torsemide for 2-3 days. Please follow-up with your regular physicians and please obtain a repeat metabolic panel in 1-2 days

## 2021-02-08 ENCOUNTER — Other Ambulatory Visit: Payer: Self-pay

## 2021-02-08 ENCOUNTER — Encounter (HOSPITAL_COMMUNITY): Payer: Self-pay

## 2021-02-08 ENCOUNTER — Emergency Department (HOSPITAL_COMMUNITY): Payer: BC Managed Care – PPO

## 2021-02-08 ENCOUNTER — Observation Stay (HOSPITAL_COMMUNITY)
Admission: EM | Admit: 2021-02-08 | Discharge: 2021-02-09 | Disposition: A | Discharge status: Home / Self Care | Payer: BC Managed Care – PPO | Attending: Internal Medicine | Admitting: Internal Medicine

## 2021-02-08 DIAGNOSIS — Z79899 Other long term (current) drug therapy: Secondary | ICD-10-CM | POA: Diagnosis not present

## 2021-02-08 DIAGNOSIS — E1169 Type 2 diabetes mellitus with other specified complication: Secondary | ICD-10-CM

## 2021-02-08 DIAGNOSIS — I5033 Acute on chronic diastolic (congestive) heart failure: Secondary | ICD-10-CM | POA: Diagnosis not present

## 2021-02-08 DIAGNOSIS — E039 Hypothyroidism, unspecified: Secondary | ICD-10-CM | POA: Insufficient documentation

## 2021-02-08 DIAGNOSIS — E119 Type 2 diabetes mellitus without complications: Secondary | ICD-10-CM

## 2021-02-08 DIAGNOSIS — R140 Abdominal distension (gaseous): Secondary | ICD-10-CM | POA: Diagnosis not present

## 2021-02-08 DIAGNOSIS — I5043 Acute on chronic combined systolic (congestive) and diastolic (congestive) heart failure: Secondary | ICD-10-CM | POA: Diagnosis present

## 2021-02-08 DIAGNOSIS — I110 Hypertensive heart disease with heart failure: Secondary | ICD-10-CM | POA: Diagnosis not present

## 2021-02-08 DIAGNOSIS — Z7982 Long term (current) use of aspirin: Secondary | ICD-10-CM | POA: Insufficient documentation

## 2021-02-08 DIAGNOSIS — E8770 Fluid overload, unspecified: Secondary | ICD-10-CM | POA: Diagnosis not present

## 2021-02-08 DIAGNOSIS — Z85841 Personal history of malignant neoplasm of brain: Secondary | ICD-10-CM

## 2021-02-08 DIAGNOSIS — Z20822 Contact with and (suspected) exposure to covid-19: Secondary | ICD-10-CM | POA: Insufficient documentation

## 2021-02-08 DIAGNOSIS — I429 Cardiomyopathy, unspecified: Secondary | ICD-10-CM | POA: Insufficient documentation

## 2021-02-08 LAB — COMPREHENSIVE METABOLIC PANEL
ALT: 92 U/L — ABNORMAL HIGH (ref 0–44)
AST: 125 U/L — ABNORMAL HIGH (ref 15–41)
Albumin: 4.3 g/dL (ref 3.5–5.0)
Alkaline Phosphatase: 80 U/L (ref 38–126)
Anion gap: 17 — ABNORMAL HIGH (ref 5–15)
BUN: 46 mg/dL — ABNORMAL HIGH (ref 6–20)
CO2: 18 mmol/L — ABNORMAL LOW (ref 22–32)
Calcium: 8.9 mg/dL (ref 8.9–10.3)
Chloride: 89 mmol/L — ABNORMAL LOW (ref 98–111)
Creatinine, Ser: 1.64 mg/dL — ABNORMAL HIGH (ref 0.44–1.00)
GFR, Estimated: 43 mL/min — ABNORMAL LOW (ref >60)
Glucose, Bld: 326 mg/dL — ABNORMAL HIGH (ref 70–99)
Potassium: 5.1 mmol/L (ref 3.5–5.1)
Sodium: 124 mmol/L — ABNORMAL LOW (ref 135–145)
Total Bilirubin: 1.6 mg/dL — ABNORMAL HIGH (ref 0.3–1.2)
Total Protein: 6.6 g/dL (ref 6.5–8.1)

## 2021-02-08 LAB — CBC WITH DIFFERENTIAL/PLATELET
Abs Immature Granulocytes: 0.09 10*3/uL — ABNORMAL HIGH (ref 0.00–0.07)
Basophils Absolute: 0.1 10*3/uL (ref 0.0–0.1)
Basophils Relative: 1 %
Eosinophils Absolute: 0.3 10*3/uL (ref 0.0–0.5)
Eosinophils Relative: 4 %
HCT: 33.1 % — ABNORMAL LOW (ref 36.0–46.0)
Hemoglobin: 11.9 g/dL — ABNORMAL LOW (ref 12.0–15.0)
Immature Granulocytes: 1 %
Lymphocytes Relative: 29 %
Lymphs Abs: 2.2 10*3/uL (ref 0.7–4.0)
MCH: 31.9 pg (ref 26.0–34.0)
MCHC: 36 g/dL (ref 30.0–36.0)
MCV: 88.7 fL (ref 80.0–100.0)
Monocytes Absolute: 0.8 10*3/uL (ref 0.1–1.0)
Monocytes Relative: 10 %
Neutro Abs: 4.2 10*3/uL (ref 1.7–7.7)
Neutrophils Relative %: 55 %
Platelets: 260 10*3/uL (ref 150–400)
RBC: 3.73 MIL/uL — ABNORMAL LOW (ref 3.87–5.11)
RDW: 16.6 % — ABNORMAL HIGH (ref 11.5–15.5)
WBC: 7.6 10*3/uL (ref 4.0–10.5)
nRBC: 0 % (ref 0.0–0.2)

## 2021-02-08 LAB — HCG, QUANTITATIVE, PREGNANCY: hCG, Beta Chain, Quant, S: 1 m[IU]/mL (ref <5)

## 2021-02-08 LAB — BRAIN NATRIURETIC PEPTIDE: B Natriuretic Peptide: 33.6 pg/mL (ref 0.0–100.0)

## 2021-02-08 NOTE — ED Provider Notes (Signed)
Emergency Medicine Provider Triage Evaluation Note  Jennifer [name] , a 30 y.o. female  was evaluated in triage.  Pt complains of weight gain of 7 pounds.  Patient reports this morning her weight was stable and she suddenly developed shortness of breath and abdominal distention and weight herself With 7 pound weight gain.  Review of Systems  Positive: Shortness of breath, weight gain Negative: Chest pain, palpitations  Physical Exam  BP (!) 173/108 (BP Location: Left Arm)   Pulse (!) 126   Temp 98 F (36.7 C) (Oral)   Resp 18   SpO2 100%  Gen:   Awake, no distress   Resp:  Normal effort  MSK:   Moves extremities without difficulty  Other:  Abdominal distension. Tachycardia.   Medical Decision Making  Medically screening exam initiated at 8:27 PM.  Appropriate orders placed.  Daci Stubbe was informed that the remainder of the evaluation will be completed by another provider, this initial triage assessment does not replace that evaluation, and the importance of remaining in the ED until their evaluation is complete.     Evlyn Courier, PA-C 02/08/21 2029    Milton Ferguson, MD 02/12/21 1004

## 2021-02-08 NOTE — ED Triage Notes (Signed)
Pt states that she has CHF and has gained 7 pounds in fluid today. Pt states that she has discomfort in her abdomen.

## 2021-02-09 ENCOUNTER — Encounter (HOSPITAL_COMMUNITY): Payer: Self-pay | Admitting: Internal Medicine

## 2021-02-09 DIAGNOSIS — I5043 Acute on chronic combined systolic (congestive) and diastolic (congestive) heart failure: Secondary | ICD-10-CM | POA: Diagnosis not present

## 2021-02-09 DIAGNOSIS — Z85841 Personal history of malignant neoplasm of brain: Secondary | ICD-10-CM

## 2021-02-09 LAB — RESP PANEL BY RT-PCR (FLU A&B, COVID) ARPGX2
Influenza A by PCR: NEGATIVE
Influenza B by PCR: NEGATIVE
SARS Coronavirus 2 by RT PCR: NEGATIVE

## 2021-02-09 LAB — URINALYSIS, ROUTINE W REFLEX MICROSCOPIC
Bilirubin Urine: NEGATIVE
Glucose, UA: 150 mg/dL — AB
Hgb urine dipstick: NEGATIVE
Ketones, ur: NEGATIVE mg/dL
Nitrite: NEGATIVE
Protein, ur: NEGATIVE mg/dL
Specific Gravity, Urine: 1.01 (ref 1.005–1.030)
pH: 6 (ref 5.0–8.0)

## 2021-02-09 MED ORDER — CARVEDILOL 3.125 MG PO TABS
3.1250 mg | ORAL_TABLET | Freq: Two times a day (BID) | ORAL | Status: DC
  Administered 2021-02-09 (×2): 3.125 mg via ORAL
  Filled 2021-02-09 (×2): qty 1

## 2021-02-09 MED ORDER — OXYCODONE-ACETAMINOPHEN 5-325 MG PO TABS
2.0000 | ORAL_TABLET | Freq: Once | ORAL | Status: AC
  Administered 2021-02-09: 2 via ORAL
  Filled 2021-02-09: qty 2

## 2021-02-09 MED ORDER — SPIRONOLACTONE 25 MG PO TABS
25.0000 mg | ORAL_TABLET | Freq: Once | ORAL | Status: AC
  Administered 2021-02-09: 25 mg via ORAL
  Filled 2021-02-09: qty 1

## 2021-02-09 MED ORDER — METOPROLOL SUCCINATE ER 50 MG PO TB24
200.0000 mg | ORAL_TABLET | Freq: Every day | ORAL | Status: DC
  Administered 2021-02-09: 200 mg via ORAL
  Filled 2021-02-09: qty 4

## 2021-02-09 MED ORDER — SODIUM CHLORIDE 0.9 % IV BOLUS
500.0000 mL | Freq: Once | INTRAVENOUS | Status: AC
  Administered 2021-02-09: 500 mL via INTRAVENOUS

## 2021-02-09 MED ORDER — FUROSEMIDE 10 MG/ML IJ SOLN
40.0000 mg | INTRAMUSCULAR | Status: AC
  Administered 2021-02-09: 40 mg via INTRAVENOUS
  Filled 2021-02-09: qty 4

## 2021-02-09 MED ORDER — FUROSEMIDE 10 MG/ML IJ SOLN
40.0000 mg | Freq: Once | INTRAMUSCULAR | Status: AC
  Administered 2021-02-09: 40 mg via INTRAVENOUS
  Filled 2021-02-09: qty 4

## 2021-02-09 MED ORDER — ONDANSETRON HCL 4 MG/2ML IJ SOLN
4.0000 mg | Freq: Four times a day (QID) | INTRAMUSCULAR | Status: DC | PRN
  Administered 2021-02-09: 4 mg via INTRAVENOUS
  Filled 2021-02-09: qty 2

## 2021-02-09 NOTE — Progress Notes (Signed)
Inpatient Diabetes Program Recommendations  AACE/ADA: New Consensus Statement on Inpatient Glycemic Control (2015)  Target Ranges:  Prepandial:   less than 140 mg/dL      Peak postprandial:   less than 180 mg/dL (1-2 hours)      Critically ill patients:  140 - 180 mg/dL   Lab Results  Component Value Date   GLUCAP 284 (H) 01/27/2021   HGBA1C 8.9 (H) 01/25/2021    Review of Glycemic Control  Diabetes history: DM 1, Endocrinologist at [facility] Outpatient Diabetes medications: New on Humulin R U-500 concentrated insulin 140 units breakfast, 105 units lunch, 105 units supper, Dexcom monitor Current orders for Inpatient glycemic control: None in ED  A1c 8.9% on 10/31 Diabetes Coordinator spoke with pt on 01/25/2021  Inpatient Diabetes Program Recommendations:   Last admission pt was on the following: -  Semglee 80 units bid -  Novolog 0-15 units tid + hs -  Novolog 10 units tid meal coverage if eating >50% of meals  Thanks,  Tama Headings RN, MSN, BC-ADM Inpatient Diabetes Coordinator Team Pager [phone] (8a-5p)

## 2021-02-09 NOTE — Consult Note (Signed)
Consult Note   Jennifer [name] WUJ:[id number] DOB: 1990/11/16 DOA: 02/08/2021  PCP: Sue Lush, PA-C Consultants:  Nancie Neas - neurosurgery; [facility] - endocrinology; [name] - cardiology Patient coming from:  Home - lives with husband; NOK: Husband, [phone]  Chief Complaint: SOB, weight gain  HPI: Jennifer [name] is a 30 y.o. female with medical history significant of remote astrocytoma; chronic combined CHF; HTN; and hypothyroidism presenting with SOB, weight gain.  She has been having trouble with her "water weight" for about a week now - weight up and down.  Has increased torsemide to twice a day.  She weighed herself Monday twice - she gained 7 pounds and was noticing abdominal distention.  The weight wasn't coming off so she came to the hospital.  She has not been SOB.  No dietary changes.  She also sees endocrinology at [facility].  Her Na++ is 124, which is ok for her.  [name] currently using 2 pillows, but has used as much as 4.  No PND.  No LE edema.  She has already received IV Lasix with significant diuresis and she feels like she can go home today.    ED Course: Carryover, per Dr. Marlowe Sax:  30 year old with chronic combined CHF with EF 45 to 50% on echo done last month here with weight gain, abdominal distention, and dyspnea.  Chest x-ray clear and BNP normal but EDPA believes her symptoms are due to volume overload she is not hypoxic.  She was given a dose of Lasix.  Review of Systems: As per HPI; otherwise review of systems reviewed and negative.   Ambulatory Status:  Ambulates without assistance  COVID Vaccine Status:  None  Past Medical History:  Diagnosis Date   Brain tumor ([place]) 03/29/1995   astrocytoma   CHF (congestive heart failure) ([place])    Cholesterosis    DM (diabetes mellitus) ([place]) 10/10/2018   Fatty liver    HTN (hypertension) 10/10/2018   Hypothyroidism 10/10/2018   Lipoprotein deficiency    Lung disease    longevity long term   Pancreatitis     Polycystic ovary syndrome     Past Surgical History:  Procedure Laterality Date   ABDOMINAL SURGERY     pt states during miscarriage got her intestine   BRAIN SURGERY     EYE MUSCLE SURGERY Right 03/28/2014   RIGHT/LEFT HEART CATH AND CORONARY ANGIOGRAPHY N/A 12/03/2020   Procedure: RIGHT/LEFT HEART CATH AND CORONARY ANGIOGRAPHY;  Surgeon: [name], MD;  Location: [place];  Service: Cardiovascular;  Laterality: N/A;   VENTRICULOSTOMY  03/28/1997    Social History   Socioeconomic History   Marital status: Married    Spouse name: Not on file   Number of children: 0   Years of education: Not on file   Highest education level: Not on file  Occupational History   Occupation: [employer]  Tobacco Use   Smoking status: Never   Smokeless tobacco: Never  Vaping Use   Vaping Use: Never used  Substance and Sexual Activity   Alcohol use: Never   Drug use: Never   Sexual activity: Not on file  Other Topics Concern   Not on file  Social History Narrative   Not on file   Social Determinants of Health   Financial Resource Strain: Low Risk    Difficulty of Paying Living Expenses: Not very hard  Food Insecurity: No Food Insecurity   Worried About Running Out of Food in the Last Year: Never true  Ran Out of Food in the Last Year: Never true  Transportation Needs: No Transportation Needs   Lack of Transportation (Medical): No   Lack of Transportation (Non-Medical): No  Physical Activity: Not on file  Stress: Not on file  Social Connections: Not on file  Intimate Partner Violence: Not on file    Allergies  Allergen Reactions   Ketamine Other (See Comments)    In vegetative state for 15 minutes per pt   Clarithromycin Other (See Comments)    Stomach pain    Fentanyl Nausea And Vomiting   Maitake Mushroom [M[place]] Itching    Itchy throat   Penicillin V Itching    Gi upset   Shellfish Allergy Swelling and Other (See Comments)    Throat swelling. Pt states  contrast in CT is okay   Morphine Itching and Rash   Penicillins Rash    Has patient had a PCN reaction causing immediate rash, facial/tongue/throat swelling, SOB or lightheadedness with hypotension: Y Has patient had a PCN reaction causing severe rash involving mucus membranes or skin necrosis: Y Has patient had a PCN reaction that required hospitalization: N Has patient had a PCN reaction occurring within the last 10 years: Y If all of the above answers are "NO", then may proceed with Cephalosporin use.    Prednisone Rash    Family History  Problem Relation Age of Onset   Diabetes Mother    Hypertension Mother    Hyperlipidemia Mother    Thyroid disease Mother    Hypertension Father    Diabetes Father     Prior to Admission medications   Medication Sig Start Date End Date Taking? Authorizing Provider  albuterol (VENTOLIN HFA) 108 (90 Base) MCG/ACT inhaler Inhale 2 puffs into the lungs every 6 (six) hours as needed for wheezing or shortness of breath. 12/31/18  Yes [provider]  aspirin EC 81 MG EC tablet Take 1 tablet (81 mg total) by mouth daily. Swallow whole. 12/07/20  Yes Nicole Kindred A, DO  carvedilol (COREG) 3.125 MG tablet Take 3.125 mg by mouth 2 (two) times daily. 02/03/21  Yes [provider]  cholecalciferol (VITAMIN D) 25 MCG tablet Take 1 tablet (1,000 Units total) by mouth daily. 12/07/20  Yes Nicole Kindred A, DO  Cyanocobalamin (VITAMIN B12 PO) Take 1 drop by mouth every morning. liquid   Yes [provider]  digoxin (LANOXIN) 0.125 MG tablet Take 1 tablet (0.125 mg total) by mouth daily. 12/07/20  Yes Nicole Kindred A, DO  DULoxetine (CYMBALTA) 30 MG capsule Take 30 mg by mouth at bedtime. 01/28/19 12/16/21 Yes [provider]  Evolocumab (REPATHA) 140 MG/ML SOSY Inject 140 mg into the skin See admin instructions. Inject 140 mg subcutaneously every other Sunday evening   Yes [provider]  fenofibrate 160 MG tablet  Take 1 tablet (160 mg total) by mouth daily. 12/07/20  Yes Nicole Kindred A, DO  fluticasone furoate-vilanterol (BREO ELLIPTA) 100-25 MCG/INH AEPB Inhale 1 puff into the lungs daily as needed (shortness of breath). 01/21/19  Yes [provider]  insulin regular human CONCENTRATED (HUMULIN R) 500 UNIT/ML KwikPen Inject 140 units 30 minutes before breakfast, 105 units 30 minutes before lunch, and 105 units 30 before dinner 01/28/21  Yes [provider]  ivabradine (CORLANOR) 5 MG TABS tablet Take 1 tablet (5 mg total) by mouth 2 (two) times daily with a meal. Patient taking differently: Take 5 mg by mouth daily. 12/06/20  Yes Ezekiel Slocumb, DO  levothyroxine (SYNTHROID) 125 MCG tablet Take 1 tablet (125 mcg total) by mouth daily at 6 (six) AM. 12/07/20  Yes Ezekiel Slocumb, DO  lipase/protease/amylase (CREON) 36000 UNITS CPEP capsule Take 1 capsule (36,000 Units total) by mouth 3 (three) times daily with meals. Patient taking differently: Take 72,000 Units by mouth 3 (three) times daily with meals. Also take 1 capsule with snacks 12/06/20  Yes Nicole Kindred A, DO  metoprolol (TOPROL-XL) 200 MG 24 hr tablet Take 200 mg by mouth at bedtime.   Yes [provider]  Multiple Vitamin (MULTIVITAMIN WITH MINERALS) TABS tablet Take 1 tablet by mouth daily. 12/07/20  Yes Nicole Kindred A, DO  ondansetron (ZOFRAN) 4 MG tablet Take 4 mg by mouth every 8 (eight) hours as needed for nausea or vomiting. 12/19/18  Yes [provider]  pantoprazole (PROTONIX) 40 MG tablet Take 40 mg by mouth daily. 12/24/19  Yes [provider]  pregabalin (LYRICA) 100 MG capsule Take 200 mg by mouth in the morning. 10/09/20  Yes [provider]  pregabalin (LYRICA) 300 MG capsule Take 300 mg by mouth at bedtime.   Yes [provider]  prochlorperazine (COMPAZINE) 10 MG tablet Take 1 tablet (10 mg total) by mouth every 6 (six) hours as needed for nausea or vomiting. 01/18/21   Yes Molpus, John, MD  sacubitril-valsartan (ENTRESTO) 24-26 MG Take 1 tablet by mouth 2 (two) times daily. HOLD FOR THE NEXT FEW DAYS 01/27/21  Yes Mariel Aloe, MD  spironolactone (ALDACTONE) 25 MG tablet Take 1 tablet (25 mg total) by mouth at bedtime. HOLD FOR THE NEXT FEW DAYS 01/27/21  Yes Mariel Aloe, MD  torsemide (DEMADEX) 20 MG tablet Take 1 tablet (20 mg total) by mouth daily. HOLD FOR THE NEXT FEW DAYS 01/27/21  Yes Mariel Aloe, MD  tretinoin (RETIN-A) 0.05 % cream Apply 1 application topically at bedtime. 01/17/21  Yes [provider]  nitroGLYCERIN (NITROSTAT) 0.4 MG SL tablet Place 1 tablet (0.4 mg total) under the tongue every 5 (five) minutes as needed for up to 25 days for chest pain. Patient not taking: Reported on 02/08/2021 08/01/19 01/06/22  [name], MD  oxyCODONE-acetaminophen (PERCOCET) 10-325 MG tablet Take 1 tablet by mouth every 6 (six) hours as needed for pain. Patient not taking: Reported on 02/08/2021 01/18/21   Molpus, Jenny Reichmann, MD    Physical Exam: Vitals:   02/09/21 0830 02/09/21 0845 02/09/21 0900 02/09/21 0922  BP: 129/82 126/85 125/78 124/82  Pulse: 95 87 87 92  Resp: (!) 31 19 (!) 24   Temp:      TempSrc:      SpO2: 97% 96% 98%   Weight:      Height:         General:  Appears calm and comfortable and is in NAD Eyes:  EOMI, normal iris, R ptosis/?visual impairment chronically ENT:  grossly normal hearing, lips & tongue, mmm Neck:  no LAD, masses or thyromegaly Cardiovascular:  RRR, no m/r/g. No LE edema.  Respiratory:   CTA bilaterally with no wheezes/rales/rhonchi.  Normal respiratory effort. Abdomen:  soft, NT, mild abdominal distention Skin:  no rash or induration seen on limited exam Musculoskeletal:  grossly normal tone BUE/BLE, good ROM, no bony abnormality Psychiatric:  grossly normal mood and affect, speech fluent and appropriate, AOx3 Neurologic:  CN 2-12 grossly intact, moves all extremities in coordinated  fashion    Radiological Exams on Admission: Independently reviewed - see discussion in A/P where applicable  DG Chest 2 View  Result Date: 02/08/2021 CLINICAL DATA:  Dyspnea EXAM: CHEST - 2 VIEW COMPARISON:  01/24/2021 FINDINGS: The heart size and mediastinal contours are within normal limits. Both lungs are clear. The visualized skeletal structures are unremarkable. IMPRESSION: No active cardiopulmonary disease. Electronically Signed   By: Inez Catalina M.D.   On: 02/08/2021 20:48    EKG: Independently reviewed.  Sinus tachycardia with rate 126; nonspecific ST changes with NSCSLT   Labs on Admission: I have personally reviewed the available labs and imaging studies at the time of the admission.  Pertinent labs:   Na++ 124 CO2 18 Glucose 326 BUN 46/Creatinine 1.64/GFR 43 AST 125/ALT 92 BNP 33.6 Unremarkable CBC COVID/flu negative UA: 150 glucose, trace LE   Assessment/Plan Principal Problem:   Acute on chronic combined systolic and diastolic CHF (congestive heart failure) (HCC) Active Problems:   DM (diabetes mellitus) (HCC)   History of astrocytoma of brain   Acute on chronic combined CHF -Patient with h/o combined CHF with EF improved to 45-50% last month, no longer wearing her LifeVest -She has struggled with weight fluctuations and abdominal distention over the last week -No respiratory symptoms (or minimal), no O2 requirement -Patient improved with IV Lasix given in the ER -She was discussed with Dr. Haroldine Laws, who recommends an additional dose of IV Lasix and then d/c -Increase Torsemide this week to 40 mg PO BID -He has arranged for f/u in the Advanced heart failure clinic on Friday -As such, it is reasonable to d/c her at this time -Dr. Zenia Resides is aware that this is the plan and has been asked to complete the paperwork -The patient agrees with the plan for d/c -No other medication changes are indicated at this time  H/o astrocytoma -Remote benign tumor for  which she took chemo to help shrink it -Unfortunately, the chemo is thought to have caused her CHF    Note: This patient has been tested and is negative for the novel coronavirus COVID-19. The patient has NOT been vaccinated against COVID-19.   Based on her overall clinical stability, she is stable for d/c.  [facility] will sign off at this time.  We appreciate the opportunity to consult.   Karmen Bongo MD [facility]   How to contact the [facility] Attending or Consulting provider [place] or covering provider during after hours [place], for this patient?  Check the care team in [facility] and look for a) attending/consulting [facility] provider listed and b) the [facility] team listed Log into [email or web address] and use [place]'s universal password to access. If you do not have the password, please contact the hospital operator. Locate the [facility] provider you are looking for under [facility] and page to a number that you can be directly reached. If you still have difficulty reaching the provider, please page the [facility] (Director on Call) for the Hospitalists listed on amion for assistance.   02/09/2021, 9:34 AM

## 2021-02-09 NOTE — Discharge Instructions (Signed)
Increase your torsemide to 40 mg twice a day until your follow-up with heart failure clinic

## 2021-02-09 NOTE — ED Provider Notes (Signed)
[facility] DEPT Provider Note   CSN: [id number] Arrival date & time: 02/08/21  2012     History Chief Complaint  Patient presents with   Congestive Heart Failure    Carianna Lague is a 30 y.o. female.  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  She reports gaining 7 pounds between 1000 and 1900 yesterday (Monday). Symptoms associated with abdominal distension with generalized discomfort; has had this type of distention in the past associated with fluid retention which she believes is responsible for her acute weight gain.  She has some shortness of breath which is aggravated when lying supine.  She took an extra dose of her torsemide 20 mg PO tablet PTA without relief.  States she usually voids 9-10 times a day, but has only voided approximately 3 times in the past 24 hours.  She does not have any fevers, palpitations, chest pain, leg swelling, syncope or near syncope.  Has been compliant with her daily medications.  The history is provided by the patient. No language interpreter was used.  Congestive Heart Failure      Past Medical History:  Diagnosis Date   Brain tumor ([place]) 03/29/1995   astrocytoma   CHF (congestive heart failure) ([place])    Cholesterosis    DM (diabetes mellitus) ([place]) 10/10/2018   Fatty liver    HTN (hypertension) 10/10/2018   Hypothyroidism 10/10/2018   Lipoprotein deficiency    Lung disease    longevity long term   Pancreatitis    Polycystic ovary syndrome     Patient Active Problem List   Diagnosis Date Noted   CHF exacerbation ([place]) 02/09/2021   Severe hyperglycemia due to diabetes mellitus ([place]) 01/25/2021   Diarrhea    Shock liver    Hypotension 11/28/2020    Acute combined systolic and diastolic heart failure (HCC)    Nonischemic cardiomyopathy (HCC)    Acute decompensated heart failure (HCC)    Elevated liver enzymes    Cardiogenic shock (HCC)    AKI (acute kidney injury) ([place]) 11/26/2020   Abdominal pain 02/40/9735   Chronic systolic CHF (congestive heart failure) ([place]) 11/23/2020   Essential hypertension 11/23/2020   Hypertriglyceridemia 11/23/2020   Prolonged QT interval 11/23/2020   DM (diabetes mellitus) ([place]) 10/10/2018   Hypothyroidism 10/10/2018    Past Surgical History:  Procedure Laterality Date   ABDOMINAL SURGERY     pt states during miscarriage got her intestine   BRAIN SURGERY     EYE MUSCLE SURGERY Right 03/28/2014   RIGHT/LEFT HEART CATH AND CORONARY ANGIOGRAPHY N/A 12/03/2020   Procedure: RIGHT/LEFT HEART CATH AND CORONARY ANGIOGRAPHY;  Surgeon: [name], MD;  Location: [facility];  Service: Cardiovascular;  Laterality: N/A;   VENTRICULOSTOMY  03/28/1997     OB History   No obstetric history on file.     Family History  Problem Relation Age of Onset   Diabetes Mother    Hypertension Mother    Hyperlipidemia Mother    Thyroid disease Mother    Hypertension Father    Diabetes Father     Social History   Tobacco Use   Smoking status: Never   Smokeless tobacco: Never  Vaping Use   Vaping Use: Never used  Substance Use Topics   Alcohol use: Never  Drug use: Never    Home Medications Prior to Admission medications   Medication Sig Start Date End Date Taking? Authorizing Provider  albuterol (VENTOLIN HFA) 108 (90 Base) MCG/ACT inhaler Inhale 2 puffs into the lungs every 6 (six) hours as needed for wheezing or shortness of breath. 12/31/18  Yes [provider]  aspirin EC 81 MG EC tablet Take 1 tablet (81 mg total) by mouth daily. Swallow whole. 12/07/20  Yes Nicole Kindred A, DO  carvedilol (COREG) 3.125 MG tablet Take 3.125 mg by mouth 2 (two) times daily. 02/03/21  Yes [provider]  cholecalciferol (VITAMIN D) 25 MCG tablet Take 1 tablet (1,000 Units total) by mouth daily. 12/07/20  Yes Nicole Kindred A, DO  Cyanocobalamin (VITAMIN B12 PO) Take 1 drop by mouth every morning. liquid   Yes [provider]  digoxin (LANOXIN) 0.125 MG tablet Take 1 tablet (0.125 mg total) by mouth daily. 12/07/20  Yes Nicole Kindred A, DO  DULoxetine (CYMBALTA) 30 MG capsule Take 30 mg by mouth at bedtime. 01/28/19 12/16/21 Yes [provider]  Evolocumab (REPATHA) 140 MG/ML SOSY Inject 140 mg into the skin See admin instructions. Inject 140 mg subcutaneously every other Sunday evening   Yes [provider]  fenofibrate 160 MG tablet Take 1 tablet (160 mg total) by mouth daily. 12/07/20  Yes Nicole Kindred A, DO  fluticasone furoate-vilanterol (BREO ELLIPTA) 100-25 MCG/INH AEPB Inhale 1 puff into the lungs daily as needed (shortness of breath). 01/21/19  Yes [provider]  insulin regular human CONCENTRATED (HUMULIN R) 500 UNIT/ML KwikPen Inject 140 units 30 minutes before breakfast, 105 units 30 minutes before lunch, and 105 units 30 before dinner 01/28/21  Yes [provider]  ivabradine (CORLANOR) 5 MG TABS tablet Take 1 tablet (5 mg total) by mouth 2 (two) times daily with a meal. Patient taking differently: Take 5 mg by mouth daily. 12/06/20  Yes Ezekiel Slocumb, DO  levothyroxine (SYNTHROID) 125 MCG tablet Take 1 tablet (125 mcg total) by mouth daily at 6 (six) AM. 12/07/20  Yes Ezekiel Slocumb, DO  lipase/protease/amylase (CREON) 36000 UNITS CPEP capsule Take 1 capsule (36,000 Units total) by mouth 3 (three) times daily with meals. Patient taking differently: Take 72,000 Units by mouth 3 (three) times daily with meals. Also take 1 capsule with snacks 12/06/20  Yes Nicole Kindred A, DO  metoprolol (TOPROL-XL) 200 MG 24 hr tablet Take 200 mg by mouth at bedtime.   Yes [provider]  Multiple Vitamin (MULTIVITAMIN WITH  MINERALS) TABS tablet Take 1 tablet by mouth daily. 12/07/20  Yes Nicole Kindred A, DO  ondansetron (ZOFRAN) 4 MG tablet Take 4 mg by mouth every 8 (eight) hours as needed for nausea or vomiting. 12/19/18  Yes [provider]  pantoprazole (PROTONIX) 40 MG tablet Take 40 mg by mouth daily. 12/24/19  Yes [provider]  pregabalin (LYRICA) 100 MG capsule Take 200 mg by mouth in the morning. 10/09/20  Yes [provider]  pregabalin (LYRICA) 300 MG capsule Take 300 mg by mouth at bedtime.   Yes [provider]  prochlorperazine (COMPAZINE) 10 MG tablet Take 1 tablet (10 mg total) by mouth every 6 (six) hours as needed for nausea or vomiting. 01/18/21  Yes Molpus, John, MD  sacubitril-valsartan (ENTRESTO) 24-26 MG Take 1 tablet by mouth 2 (two) times daily. HOLD FOR THE NEXT FEW DAYS 01/27/21  Yes Mariel Aloe, MD  spironolactone (ALDACTONE) 25 MG tablet Take  1 tablet (25 mg total) by mouth at bedtime. HOLD FOR THE NEXT FEW DAYS 01/27/21  Yes Mariel Aloe, MD  torsemide (DEMADEX) 20 MG tablet Take 1 tablet (20 mg total) by mouth daily. HOLD FOR THE NEXT FEW DAYS 01/27/21  Yes Mariel Aloe, MD  tretinoin (RETIN-A) 0.05 % cream Apply 1 application topically at bedtime. 01/17/21  Yes [provider]  nitroGLYCERIN (NITROSTAT) 0.4 MG SL tablet Place 1 tablet (0.4 mg total) under the tongue every 5 (five) minutes as needed for up to 25 days for chest pain. Patient not taking: Reported on 02/08/2021 08/01/19 01/06/22  [name], MD  oxyCODONE-acetaminophen (PERCOCET) 10-325 MG tablet Take 1 tablet by mouth every 6 (six) hours as needed for pain. Patient not taking: Reported on 02/08/2021 01/18/21   Molpus, John, MD    Allergies    Ketamine, Clarithromycin, Fentanyl, Maitake mushroom [maitake], Penicillin v, Shellfish allergy, Morphine, Penicillins, and Prednisone  Review of Systems   Review of Systems Ten systems reviewed and are negative for acute change,  except as noted in the HPI.    Physical Exam Updated Vital Signs BP (!) 144/87   Pulse 90   Temp 98.5 F (36.9 C) (Oral)   Resp 20   Ht 4' 9"  (1.448 m)   Wt 58.5 kg   SpO2 96%   BMI 27.92 kg/m   Physical Exam Vitals and nursing note reviewed.  Constitutional:      General: [name] not in acute distress.    Appearance: [name] well-developed. [name] not diaphoretic.     Comments: Patient in NAD  HENT:     Head: Normocephalic and atraumatic.  Eyes:     General: No scleral icterus.    Conjunctiva/sclera: Conjunctivae normal.  Cardiovascular:     Rate and Rhythm: Regular rhythm. Tachycardia present.     Pulses: Normal pulses.     Comments: HR 106-110bpm Pulmonary:     Effort: Pulmonary effort is normal. No respiratory distress.     Breath sounds: No wheezing, rhonchi or rales.     Comments: Respirations even and unlabored. Grossly clear lung sounds. Abdominal:     Comments: Abdomen is soft, obese, distended.  There is mild tenderness in the upper abdomen and epigastrium.  No peritoneal signs.  Musculoskeletal:        General: Normal range of motion.     Cervical back: Normal range of motion.     Comments: No BLE edema  Skin:    General: Skin is warm and dry.     Coloration: Skin is not pale.     Findings: No erythema or rash.  Neurological:     Mental Status: [name] alert and oriented to person, place, and time.     Coordination: Coordination normal.  Psychiatric:        Behavior: Behavior normal.    ED Results / Procedures / Treatments   Labs (all labs ordered are listed, but only abnormal results are displayed) Labs Reviewed  CBC WITH DIFFERENTIAL/PLATELET - Abnormal; Notable for the following components:      Result Value   RBC 3.73 (*)    Hemoglobin 11.9 (*)    HCT 33.1 (*)    RDW 16.6 (*)    Abs Immature Granulocytes 0.09 (*)    All other components within normal limits  COMPREHENSIVE METABOLIC PANEL - Abnormal; Notable for the following components:    Sodium 124 (*)    Chloride 89 (*)  CO2 18 (*)    Glucose, Bld 326 (*)    BUN 46 (*)    Creatinine, Ser 1.64 (*)    AST 125 (*)    ALT 92 (*)    Total Bilirubin 1.6 (*)    GFR, Estimated 43 (*)    Anion gap 17 (*)    All other components within normal limits  URINALYSIS, ROUTINE W REFLEX MICROSCOPIC - Abnormal; Notable for the following components:   Glucose, UA 150 (*)    Leukocytes,Ua TRACE (*)    Bacteria, UA RARE (*)    All other components within normal limits  RESP PANEL BY RT-PCR (FLU A&B, COVID) ARPGX2  BRAIN NATRIURETIC PEPTIDE  HCG, QUANTITATIVE, PREGNANCY    EKG EKG Interpretation  Date/Time:  Monday February 08 2021 20:30:57 EST Ventricular Rate:  126 PR Interval:  135 QRS Duration: 89 QT Interval:  308 QTC Calculation: 446 R Axis:   23 Text Interpretation: Sinus tachycardia Repol abnrm suggests ischemia, lateral leads Baseline wander in lead(s) V1 When compared with ECG of 01/24/2021, No significant change was found Confirmed by Delora Fuel ([id number]) on 02/09/2021 1:20:08 AM  Radiology DG Chest 2 View  Result Date: 02/08/2021 CLINICAL DATA:  Dyspnea EXAM: CHEST - 2 VIEW COMPARISON:  01/24/2021 FINDINGS: The heart size and mediastinal contours are within normal limits. Both lungs are clear. The visualized skeletal structures are unremarkable. IMPRESSION: No active cardiopulmonary disease. Electronically Signed   By: Inez Catalina M.D.   On: 02/08/2021 20:48    Procedures Procedures   Medications Ordered in ED Medications  carvedilol (COREG) tablet 3.125 mg (3.125 mg Oral Given 02/09/21 0120)  metoprolol succinate (TOPROL-XL) 24 hr tablet 200 mg (200 mg Oral Given 02/09/21 0120)  sodium chloride 0.9 % bolus 500 mL (0 mLs Intravenous Stopped 02/09/21 0150)  spironolactone (ALDACTONE) tablet 25 mg (25 mg Oral Given 02/09/21 0121)  oxyCODONE-acetaminophen (PERCOCET/ROXICET) 5-325 MG per tablet 2 tablet (2 tablets Oral Given 02/09/21 0425)  furosemide (LASIX)  injection 40 mg (40 mg Intravenous Given 02/09/21 0534)    ED Course  I have reviewed the triage vital signs and the nursing notes.  Pertinent labs & imaging results that were available during my care of the patient were reviewed by me and considered in my medical decision making (see chart for details).  Clinical Course as of 02/09/21 0729  Tue Feb 09, 2021  0127 Cardiology paged to discuss patient case.  Fellow being called to a STEMI activation.  Will call back once available. [OT]  1572 Spoke with MD Conley Canal of Cardiology who has reviewed the patient's case. Does not feel that there are any clinical signs of heart failure to indicate need for emergent diuresis; nothing further to investigate from a cardiac standpoint in the ED. [KH]  [phone] Patient requesting home percocet for management of chronic pancreatitis pain. Meds have been ordered. [KH]  [id number] Per chart review, triglycerides 5591 on 01/28/21. Based on this value, Na change calculated at 11.18. Adjusted sodium based on this value is reassuring. [KH]    Clinical Course User Index [KH] Beverely Pace   MDM Rules/Calculators/A&P                           30 year old female with complex past medical history presents to the emergency department reporting 7 pound weight gain with concern for fluid overload.  She reports that she usually retains fluid in her abdomen.  She does have distention  today with associated discomfort.  Reporting some shortness of breath, but has not been hypoxic or exhibited any signs of respiratory distress.  Her lungs are clear to auscultation and chest x-ray without overt pulmonary edema.  While the patient's BNP is not elevated today, she historically rarely exhibits BNP elevation.  Has history of diminished EF and at 1 time required use of a LifeVest.  This was discontinued recently after her EF improved to 45 to 50%.  Suspect that the patient will require diuresis, though this has been challenging (per  chart review) given her renal insufficiency.  Plan for admission for careful IV diuresis and monitoring of renal function.  Patient hemodynamically stable, agreeable with plan for admission.   Final Clinical Impression(s) / ED Diagnoses Final diagnoses:  Hypervolemia, unspecified hypervolemia type  Abdominal distension    Rx / DC Orders ED Discharge Orders     None        Antonietta Breach, PA-C 40/76/80 [id number]    Delora Fuel, MD 06/09/92 [phone]

## 2021-02-12 ENCOUNTER — Other Ambulatory Visit: Payer: Self-pay

## 2021-02-12 ENCOUNTER — Encounter (HOSPITAL_COMMUNITY): Payer: Self-pay

## 2021-02-12 ENCOUNTER — Ambulatory Visit (HOSPITAL_COMMUNITY)
Admit: 2021-02-12 | Discharge: 2021-02-12 | Disposition: A | Discharge status: Home / Self Care | Payer: BC Managed Care – PPO | Attending: Family Medicine | Admitting: Family Medicine

## 2021-02-12 VITALS — BP 108/70 | HR 97 | Ht <= 58 in | Wt 127.8 lb

## 2021-02-12 DIAGNOSIS — K760 Fatty (change of) liver, not elsewhere classified: Secondary | ICD-10-CM | POA: Insufficient documentation

## 2021-02-12 DIAGNOSIS — E109 Type 1 diabetes mellitus without complications: Secondary | ICD-10-CM | POA: Diagnosis not present

## 2021-02-12 DIAGNOSIS — Z7984 Long term (current) use of oral hypoglycemic drugs: Secondary | ICD-10-CM | POA: Insufficient documentation

## 2021-02-12 DIAGNOSIS — R1900 Intra-abdominal and pelvic swelling, mass and lump, unspecified site: Secondary | ICD-10-CM | POA: Insufficient documentation

## 2021-02-12 DIAGNOSIS — E781 Pure hyperglyceridemia: Secondary | ICD-10-CM | POA: Diagnosis not present

## 2021-02-12 DIAGNOSIS — I110 Hypertensive heart disease with heart failure: Secondary | ICD-10-CM | POA: Diagnosis not present

## 2021-02-12 DIAGNOSIS — I5022 Chronic systolic (congestive) heart failure: Secondary | ICD-10-CM | POA: Insufficient documentation

## 2021-02-12 DIAGNOSIS — Z79899 Other long term (current) drug therapy: Secondary | ICD-10-CM | POA: Diagnosis not present

## 2021-02-12 DIAGNOSIS — R140 Abdominal distension (gaseous): Secondary | ICD-10-CM

## 2021-02-12 DIAGNOSIS — K861 Other chronic pancreatitis: Secondary | ICD-10-CM | POA: Insufficient documentation

## 2021-02-12 DIAGNOSIS — E108 Type 1 diabetes mellitus with unspecified complications: Secondary | ICD-10-CM | POA: Insufficient documentation

## 2021-02-12 DIAGNOSIS — Z7982 Long term (current) use of aspirin: Secondary | ICD-10-CM | POA: Diagnosis not present

## 2021-02-12 DIAGNOSIS — I2510 Atherosclerotic heart disease of native coronary artery without angina pectoris: Secondary | ICD-10-CM

## 2021-02-12 NOTE — Progress Notes (Signed)
Advanced Heart Failure Clinic Note    PCP: Sue Lush, PA-C PCP-Cardiologist: Dr. Einar Gip HF Cardiologist: Dr. Haroldine Laws  HPI: Jennifer [name] is a 30 y.o. 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   Has been followed by Dr. Einar Gip for her HF.    Admitted 11/22/20 with ab pain and diarrhea. Ab CT concerning for vasculitis (no pancreatitis).Her TG were found to be  4030. She was started on an insulin/dextrose infusion. Cardiology and Nephrology were consulted after her creatinine and LFTs continued to rise and she began developing hypotension. Echo 11/28/20 EF was found to be 20-25% with moderate RV dysfunction and mild septal flattening. Started on DBA 2.5. AHF consulted for further management. PICC placed to follow CVP and Co-Ox. BP were high and milrinone added to augment diuresis. cMRI c/w prior myocarditis vs sarcoid (see below). Mod pericardial effusion also noted. LVEF 29%. RV normal. She underwent R/LHC showing single vessel RCA occlusion, RA 1, LVEDP 17, Fick CO 5.6/CI 3.88. She was able to be weaned of inotropes and GDMT titrated.    Post hospital follow up she was drinking >2L/day fluid and eating more salty foods, volume was up, ReDs 43%, instructed to increase lasix to 20 mg bid x 3 days. She followed up with [name] with [facility] 12/17/20 for a 2nd opinion. He placed LifeVest and switched beta blocker to Toprol XL.  Called after hours service with swelling, instructed to continue lasix 20 mg bid.  Seen in ED 12/25/20 for fall. She was standing at work and became dizzy. She fell backwards and hit her head. CT neg, labs showed K 5.7. She was given 1 dose of Lokelma. She denied palpitations.  Volume overloaded at follow up 01/06/21, lasix switched to torsemide. She had a LifeVest per Cardiology at  [facility].  Echo (10/22): EF up to 45-50%, grade II DD, RV ok. LifeVest off.  Treated in ED 10/22 for pancreatitis, then admitted 11/22 for hyperglycemia, did not meet criteria for DKA.  Hospitalization c/b hyponatremia, sodium 118. Concern for volume overload. Lasix restarted but SCr increased. Nephrology consulted and restarted gentle IVF and GDMT held for a couple of days. Recommendation for Tolvaptan, however patient declined and requested discharge with outpatient follow-up her MD at [facility]. Jardiance stopped at discharge, weight 129 lbs.   Seen back in ED a few days later with increased weight and abd distention. Given IV lasix in ED and torsemide increased to 40 bid.  Today she returns for post hospitalization HF follow up with her husband. Her breathing is better but will get SOB with increased activity or walking up stairs/inclines. Her main complaint is abdominal swelling. This is worse after she eats. Denies CP, dizziness, abnormal bleeding, or PND/Orthopnea. Appetite ok, no N/V. No fever or chills. Weight at home 127 pounds. Taking all medications. Now taking torsemide 40 mg bid but feels like she is not urinating as much as she has been. Having regular BMs.  Cardiac Studies: - [facility] 12/03/20:  Normal right heart catheterization pressures.   RA 4/1, mean 1;  RV 23/4  EDP 6  PA 22/8, mean 11 PA saturation 76%.   PW 9/7, mean 7 mmHg.  CO 5.6, CI 3.88.   QP/QS 1.00.  LV: 136/9 EDP 17 mmHg.   Ao 131/86, mean 105 mmHg.  No pressure gradient across the aortic valve.  RCA: A  very small but dominant RCA which is diffusely diseased around 80%.  Mid segment 99% occlusion.  Conus branch collateralizes distal RCA. LM: Large, smooth and normal. LAD: Large vessel, minimal diffuse disease.  Small to moderate-sized D1 and D2. CX: Large vessel.  Minimal diffuse disease.  Gives origin to large OM1 and OM 2.   Impression: Nonischemic cardiomyopathy, single vessel occlusion does not explain her  markedly reduced LVEF.  Medical therapy for CAD and nonischemic cardiomyopathy.    - cMRI (12/01/20): LVEF 76% severe systolic dysfunction, normal RV size and systolic function, RV EF 72%, moderate pericardial effusion, midwall LGE basal to mid ant/anterolateral walls (myocarditis vs sarcoidosis; rec cardiac PET to evaluate for sarcoid).  - Echo (1/21): EF 40%. Mod MR/TR (Dr. Einar Gip) - Lexiscan (2/21): EF 38% Normal perfusion - Echo (1/22): EF 35-40% Mod MR  - cMRI  [facility]/04/2017 [facility]): LVEF 42% mod decreased, mild decrease RV function, findings suggestive of myocarditis and infarction, small circumferential pericardial effusion.  Past Medical History:  Diagnosis Date   Brain tumor ([place]) 03/29/1995   astrocytoma   CHF (congestive heart failure) (HCC)    Cholesterosis    DM (diabetes mellitus) ([place]) 10/10/2018   Fatty liver    HTN (hypertension) 10/10/2018   Hypothyroidism 10/10/2018   Lipoprotein deficiency    Lung disease    longevity long term   Pancreatitis    Polycystic ovary syndrome    Current Outpatient Medications  Medication Sig Dispense Refill   albuterol (VENTOLIN HFA) 108 (90 Base) MCG/ACT inhaler Inhale 2 puffs into the lungs every 6 (six) hours as needed for wheezing or shortness of breath.     aspirin EC 81 MG EC tablet Take 1 tablet (81 mg total) by mouth daily. Swallow whole. 30 tablet 1   carvedilol (COREG) 3.125 MG tablet Take 3.125 mg by mouth 2 (two) times daily.     cholecalciferol (VITAMIN D) 25 MCG tablet Take 1 tablet (1,000 Units total) by mouth daily. 30 tablet 1   Cyanocobalamin (VITAMIN B12 PO) Take 1 drop by mouth every morning. liquid     digoxin (LANOXIN) 0.125 MG tablet Take 1 tablet (0.125 mg total) by mouth daily. 30 tablet 1   DULoxetine (CYMBALTA) 30 MG capsule Take 30 mg by mouth at bedtime.     erythromycin ophthalmic ointment Place one application into the right eye at bedtime.     Evolocumab (REPATHA) 140 MG/ML SOSY Inject 140 mg into the skin  See admin instructions. Inject 140 mg subcutaneously every other Sunday evening     fenofibrate 160 MG tablet Take 1 tablet (160 mg total) by mouth daily. 30 tablet 1   fluticasone furoate-vilanterol (BREO ELLIPTA) 100-25 MCG/INH AEPB Inhale 1 puff into the lungs daily as needed (shortness of breath).     insulin regular human CONCENTRATED (HUMULIN R) 500 UNIT/ML KwikPen Inject 140 units 30 minutes before breakfast, 105 units 30 minutes before lunch, and 105 units 30 before dinner     levothyroxine (SYNTHROID) 125 MCG tablet Take 1 tablet (125 mcg total) by mouth daily at 6 (six) AM. 30 tablet 1   lipase/protease/amylase (CREON) 36000 UNITS CPEP capsule Take 1 capsule (36,000 Units total) by mouth 3 (three) times daily with meals. (Patient taking differently: Take 72,000 Units by mouth 3 (three) times daily with meals. Also take 1 capsule with snacks) 270 capsule 0   magnesium oxide (MAG-OX) 400 MG tablet Take by mouth.     Multiple Vitamin (MULTIVITAMIN WITH MINERALS) TABS tablet Take  1 tablet by mouth daily. 30 tablet 1   nitrofurantoin, macrocrystal-monohydrate, (MACROBID) 100 MG capsule Take 100 mg by mouth 2 (two) times daily.     nitroGLYCERIN (NITROSTAT) 0.4 MG SL tablet Place 1 tablet (0.4 mg total) under the tongue every 5 (five) minutes as needed for up to 25 days for chest pain. 25 tablet 3   nystatin-triamcinolone (MYCOLOG II) cream nystatin-triamcinolone 100,000 unit/g-0.1 % topical cream  APPLY CREAM TOPICALLY TO AFFECTED AREA ONCE DAILY     ondansetron (ZOFRAN) 4 MG tablet Take 4 mg by mouth every 8 (eight) hours as needed for nausea or vomiting.     oxyCODONE-acetaminophen (PERCOCET) 10-325 MG tablet Take 1 tablet by mouth every 6 (six) hours as needed for pain. 20 tablet 0   pantoprazole (PROTONIX) 40 MG tablet Take 40 mg by mouth daily.     pregabalin (LYRICA) 100 MG capsule Take 200 mg by mouth in the morning.     pregabalin (LYRICA) 300 MG capsule Take 300 mg by mouth at bedtime.      prochlorperazine (COMPAZINE) 10 MG tablet Take 1 tablet (10 mg total) by mouth every 6 (six) hours as needed for nausea or vomiting. 20 tablet 0   sacubitril-valsartan (ENTRESTO) 24-26 MG Take 1 tablet by mouth 2 (two) times daily. HOLD FOR THE NEXT FEW DAYS 60 tablet 11   spironolactone (ALDACTONE) 25 MG tablet Take 1 tablet (25 mg total) by mouth at bedtime. HOLD FOR THE NEXT FEW DAYS     torsemide (DEMADEX) 20 MG tablet Take 1 tablet (20 mg total) by mouth daily. HOLD FOR THE NEXT FEW DAYS     tretinoin (RETIN-A) 0.05 % cream Apply 1 application topically at bedtime.     ivabradine (CORLANOR) 5 MG TABS tablet Take 1 tablet (5 mg total) by mouth 2 (two) times daily with a meal. (Patient not taking: Reported on 02/12/2021) 60 tablet 1   No current facility-administered medications for this encounter.   Allergies  Allergen Reactions   Ketamine Other (See Comments)    In vegetative state for 15 minutes per pt   Clarithromycin Other (See Comments)    Stomach pain    Fentanyl Nausea And Vomiting   Maitake Mushroom [Maitake] Itching    Itchy throat   Penicillin V Itching    Gi upset   Shellfish Allergy Swelling and Other (See Comments)    Throat swelling. Pt states contrast in CT is okay   Morphine Itching and Rash   Penicillins Rash    Has patient had a PCN reaction causing immediate rash, facial/tongue/throat swelling, SOB or lightheadedness with hypotension: Y Has patient had a PCN reaction causing severe rash involving mucus membranes or skin necrosis: Y Has patient had a PCN reaction that required hospitalization: N Has patient had a PCN reaction occurring within the last 10 years: Y If all of the above answers are "NO", then may proceed with Cephalosporin use.    Prednisone Rash   Social History   Socioeconomic History   Marital status: Married    Spouse name: Not on file   Number of children: 0   Years of education: Not on file   Highest education level: Not on file   Occupational History   Occupation: [employer]  Tobacco Use   Smoking status: Never   Smokeless tobacco: Never  Vaping Use   Vaping Use: Never used  Substance and Sexual Activity   Alcohol use: Never   Drug use: Never   Sexual  activity: Not on file  Other Topics Concern   Not on file  Social History Narrative   Not on file   Social Determinants of Health   Financial Resource Strain: Low Risk    Difficulty of Paying Living Expenses: Not very hard  Food Insecurity: No Food Insecurity   Worried About Running Out of Food in the Last Year: Never true   Ran Out of Food in the Last Year: Never true  Transportation Needs: No Transportation Needs   Lack of Transportation (Medical): No   Lack of Transportation (Non-Medical): No  Physical Activity: Not on file  Stress: Not on file  Social Connections: Not on file  Intimate Partner Violence: Not on file   Family History  Problem Relation Age of Onset   Diabetes Mother    Hypertension Mother    Hyperlipidemia Mother    Thyroid disease Mother    Hypertension Father    Diabetes Father    BP 108/70   Pulse 97   Ht 4' 9"  (1.448 m)   Wt 58 kg (127 lb 12.8 oz)   SpO2 98%   BMI 27.66 kg/m   Wt Readings from Last 3 Encounters:  02/12/21 58 kg (127 lb 12.8 oz)  02/08/21 58.5 kg (129 lb)  01/27/21 56.7 kg (125 lb)   PHYSICAL EXAM: General:  NAD. No resp difficulty, walked into clinic. HEENT: R eye ptosis Neck: Supple. No JVD. Carotids 2+ bilat; no bruits. No lymphadenopathy or thryomegaly appreciated. Cor: PMI nondisplaced. Regular rate & rhythm. No rubs, gallops or murmurs. Lungs: Clear Abdomen: Rounded abdomen, nontender, nondistended. No hepatosplenomegaly. No bruits or masses. Good bowel sounds. Extremities: No cyanosis, clubbing, rash, edema Neuro: Alert & oriented x 3, cranial nerves grossly intact. Moves all 4 extremities, flaccid LUE.  Affect pleasant.  ReDs: 42%  ECG: SR 90 bpm (personally  reviewed).  ASSESSMENT & PLAN: 1. Chronic Systolic Heart Failure - likely NICM. - Echo (1/22): EF 35-40% Mod MR - Echo (11/28/20): EF 20-25% with moderate RV dysfunction and mild septal flattening. Mod-severe TR - Rheum serologies negative, but Rheumatoid factor mildly elevated at 15.5 - cMRI c/w prior myocarditis vs sarcoid. May have delayed chemo cardiotoxcity.  - R/LHC with single vessel RCA occlusion, RA 1, LVEDP 17, Fick CO 5.6/CI 3.88. - Echo (10/22): EF 45-50%, grade II DD, RV ok. - Stable NYHA II today, she is not volume overloaded on exam and weight down a couple lbs. However, ReDs 42%, ? Accurate reading.  - Continue torsemide 40 mg bid for now. - Continue carvedilol 3.125 mg bid. - Continue spiro 25 mg daily. - Continue Entresto 24/26 mg bid. - She does not think she has been on her Corlanor. HR 97 today. - Stop digoxin w/ improved EF. - [name] off Jardiance due to SE, per Endocrinologist. - [name] contracepting with OCPs, will need to avoid pregnancy with teratogenicity of current HF meds. - Given Rx for [facility] BMET, CBC, BNP, repeat BMET in 1-2 weeks.  2. CAD - Single vessel RCA occlusion (small co-dominant vessel) on cardiac cath 09/08. - Medical management.  - No s/s ischemia.  - Continue aspirin + statin.   3. Severe hyperTG - Due to LPL deficiency. - Continue fenofibrate.  - Discussed referral to Lipid Clinic for further management. She prefers to continue with her endocrinologist at [facility] who currently manages/follows. - Recent pancreatitis.   4. Type I DM - Follows w/ endocrinology at [facility]. - Off SGLT2i.  5. Abdominal swelling/bloating -  Seen at [place] (11/20) for IBS, per chart review. - Had penumatosis intestinalis. She was started on pancreatic enzyme replacement. - EGD (10/21): normal, bx taken to assess for celiac. - CT abdomen (2/22) and RUQ US showed no obvious gallbladder stones. - HIDA (4/22): ended early due to tachypnea, but cystic duct and  gallbladder appeared WNL. - Abdominal exam benign today. I do not think her abdominal symptoms are due to volume overload. - She tells me she has delayed gastric emptying and symptoms are worse after she eats. - Likely needs gastric emptying study. - Not on any prokinetics.  - Previously followed by Dr. Darlis Loan in [place], and is requesting a 2nd opinion. - Refer to GI.  Follow up with Dr. Haroldine Laws next month as scheduled.  [place], FNP 02/12/21

## 2021-02-12 NOTE — Patient Instructions (Signed)
EKG was done today,  PLEASE Get labs drawn at [place] today and in 1 week, you were given a prescription to give to [facility]  STOP Digoxin  You have been referred to Gastroenterology they will call you to make a appointment  Your physician recommends that you schedule a follow-up appointment in: please keep scheduled appointment with Dr. Haroldine Laws  At the [facility], you and your health needs are our priority. As part of our continuing mission to provide you with exceptional heart care, we have created designated Provider Care Teams. These Care Teams include your primary Cardiologist (physician) and Advanced Practice Providers (APPs- Physician Assistants and Nurse Practitioners) who all work together to provide you with the care you need, when you need it.   You may see any of the following providers on your designated Care Team at your next follow up: Dr Glori Bickers Dr Haynes Kerns, NP Lyda Jester, [place] [facility] [place], [place] Audry Riles, PharmD   Please be sure to bring in all your medications bottles to every appointment.    If you have any questions or concerns before your next appointment please send [place] a message through [place] or call our office at [phone].    TO LEAVE A MESSAGE FOR THE NURSE SELECT OPTION 2, PLEASE LEAVE A MESSAGE INCLUDING: YOUR NAME DATE OF BIRTH CALL BACK NUMBER REASON FOR CALL**this is important as we prioritize the call backs  YOU WILL RECEIVE A CALL BACK THE SAME DAY AS LONG AS YOU CALL BEFORE 4:00 PM

## 2021-02-26 ENCOUNTER — Other Ambulatory Visit (INDEPENDENT_AMBULATORY_CARE_PROVIDER_SITE_OTHER): Payer: BC Managed Care – PPO

## 2021-02-26 ENCOUNTER — Encounter: Payer: Self-pay | Admitting: Internal Medicine

## 2021-02-26 ENCOUNTER — Ambulatory Visit (INDEPENDENT_AMBULATORY_CARE_PROVIDER_SITE_OTHER): Payer: BC Managed Care – PPO | Admitting: Internal Medicine

## 2021-02-26 VITALS — Ht <= 58 in | Wt 131.2 lb

## 2021-02-26 DIAGNOSIS — K3184 Gastroparesis: Secondary | ICD-10-CM

## 2021-02-26 DIAGNOSIS — R7989 Other specified abnormal findings of blood chemistry: Secondary | ICD-10-CM | POA: Diagnosis not present

## 2021-02-26 DIAGNOSIS — R197 Diarrhea, unspecified: Secondary | ICD-10-CM | POA: Diagnosis not present

## 2021-02-26 DIAGNOSIS — R140 Abdominal distension (gaseous): Secondary | ICD-10-CM

## 2021-02-26 LAB — FERRITIN: Ferritin: 467.8 ng/mL — ABNORMAL HIGH (ref 10.0–291.0)

## 2021-02-26 LAB — IBC PANEL
Iron: 79 ug/dL (ref 42–145)
Saturation Ratios: 17.6 % — ABNORMAL LOW (ref 20.0–50.0)
TIBC: 449.4 ug/dL (ref 250.0–450.0)
Transferrin: 321 mg/dL (ref 212.0–360.0)

## 2021-02-26 NOTE — Patient Instructions (Signed)
If you are age 29 or older, your body mass index should be between 23-30. Your Body mass index is 28.4 kg/m. If this is out of the aforementioned range listed, please consider follow up with your Primary Care Provider.  If you are age 60 or younger, your body mass index should be between 19-25. Your Body mass index is 28.4 kg/m. If this is out of the aformentioned range listed, please consider follow up with your Primary Care Provider.   ________________________________________________________  The  GI providers would like to encourage you to use [facility] to communicate with providers for non-urgent requests or questions.  Due to long hold times on the telephone, sending your provider a message by [facility] may be a faster and more efficient way to get a response.  Please allow 48 business hours for a response.  Please remember that this is for non-urgent requests.  _______________________________________________________  Your provider has requested that you go to the basement level for lab work before leaving today. Press "B" on the elevator. The lab is located at the first door on the left as you exit the elevator.  You have been scheduled for a CT scan of the abdomen and pelvis at [place] ([address]---this is in the same building as Press [facility]).   You are scheduled on 03/05/2021 at 2:00pm. You should arrive 15 minutes prior to your appointment time for registration. Please follow the written instructions below on the day of your exam:  WARNING: IF YOU ARE ALLERGIC TO IODINE/X-RAY DYE, PLEASE NOTIFY RADIOLOGY IMMEDIATELY AT [phone]! YOU WILL BE GIVEN A 13 HOUR PREMEDICATION PREP.  1) Do not eat anything after 10:00am (4 hours prior to your test) 2) You have been given 2 bottles of oral contrast to drink. The solution may taste better if refrigerated, but do NOT add ice or any other liquid to this solution. Shake well before drinking.    Drink 1 bottle of  contrast @ 12:00pm (2 hours prior to your exam)  Drink 1 bottle of contrast @ 1:00pm (1 hour prior to your exam)  You may take any medications as prescribed with a small amount of water except for the following: Metformin, Glucophage, Glucovance, Avandamet, Riomet, Fortamet, Actoplus Met, Janumet, Glumetza or Metaglip. The above medications must be held the day of the exam AND 48 hours after the exam.  The purpose of you drinking the oral contrast is to aid in the visualization of your intestinal tract. The contrast solution may cause some diarrhea. Before your exam is started, you will be given a small amount of fluid to drink. Depending on your individual set of symptoms, you may also receive an intravenous injection of x-ray contrast/dye. Plan on being at [facility] for 30 minutes or long, depending on the type of exam you are having performed.  If you have any questions regarding your exam or if you need to reschedule, you may call the CT department at [phone] between the hours of 8:00 am and 5:00 pm, Monday-Friday.  ________________________________________________________________________

## 2021-02-26 NOTE — Progress Notes (Signed)
Chief Complaint: Gastroparesis  HPI : 30 year old female with history of HFrEF (EF 45-50% in 12/2020), astrocytoma s/p resection in 1997, DM, hypertriglyceridemia, NAFLD, hypothyroidism, and chronic pancreatitis presents with gastroparesis.  She had a gastric emptying study that was performed on 07/2020 that showed delayed gastric emptying. Whenever she eats something, she will get bloated out to the point where she will not be able to breath. [name] already taking a substantial amount of diuretics (including torsemide and spironolactone), but she has not had a significant improvement in her abdominal distension.  Thus she would like to see if there are any alternative options for improving abdominal distension. She thinks that her bloating got worse after 11/2020. [name] following a low fat diet due to her history of chronic pancreatitis.  She can barely eat because she feels full all the time. Has not even been able to do 5 small meals throughout the daytime due to her abdominal bloating. She has had an EGD done in 2021 that was fairly normal. She has been having diarrhea more frequently over the last couple fo weeks. She has been having 9-10 BMs per day. She has not had any sick exposures recently. She has IBS and thus thinks that this may be a flare of her IBS. [name] taking her Creon as prescribed with two capsules with meals and one with snacks. Weight has gone up significantly since she started feeling poorly. Her last episode of pancreatitis was in 12/2020. Endorses issues with N&V for years. Denies confusion, hematochezia, melena. Denies fam hx of colon cancer.  Wt Readings from Last 3 Encounters:  02/26/21 131 lb 4 oz (59.5 kg)  02/12/21 127 lb 12.8 oz (58 kg)  02/08/21 129 lb (58.5 kg)    Past Medical History:  Diagnosis Date   Brain tumor ([place]) 03/29/1995   astrocytoma   CHF (congestive heart failure) ([place])    Cholesterosis    DM (diabetes mellitus) ([place]) 10/10/2018   Fatty liver     HTN (hypertension) 10/10/2018   Hypothyroidism 10/10/2018   Lipoprotein deficiency    Lung disease    longevity long term   Pancreatitis    Polycystic ovary syndrome      Past Surgical History:  Procedure Laterality Date   ABDOMINAL SURGERY     pt states during miscarriage got her intestine   BRAIN SURGERY     EYE MUSCLE SURGERY Right 03/28/2014   RIGHT/LEFT HEART CATH AND CORONARY ANGIOGRAPHY N/A 12/03/2020   Procedure: RIGHT/LEFT HEART CATH AND CORONARY ANGIOGRAPHY;  Surgeon: [name], MD;  Location: [facility];  Service: Cardiovascular;  Laterality: N/A;   VENTRICULOSTOMY  03/28/1997   Family History  Problem Relation Age of Onset   Diabetes Mother    Hypertension Mother    Hyperlipidemia Mother    Thyroid disease Mother    Hypertension Father    Diabetes Father    Pancreatic cancer Paternal Aunt    Pancreatic cancer Paternal Uncle    Colon cancer Neg Hx    Esophageal cancer Neg Hx    Stomach cancer Neg Hx    Social History   Tobacco Use   Smoking status: Never   Smokeless tobacco: Never  Vaping Use   Vaping Use: Never used  Substance Use Topics   Alcohol use: Never   Drug use: Never   Current Outpatient Medications  Medication Sig Dispense Refill   albuterol (VENTOLIN HFA) 108 (90 Base) MCG/ACT inhaler Inhale 2 puffs into the  lungs every 6 (six) hours as needed for wheezing or shortness of breath.     aspirin EC 81 MG EC tablet Take 1 tablet (81 mg total) by mouth daily. Swallow whole. 30 tablet 1   carvedilol (COREG) 3.125 MG tablet Take 3.125 mg by mouth 2 (two) times daily.     cholecalciferol (VITAMIN D) 25 MCG tablet Take 1 tablet (1,000 Units total) by mouth daily. 30 tablet 1   Cyanocobalamin (VITAMIN B12 PO) Take 1 drop by mouth every morning. liquid     DULoxetine (CYMBALTA) 30 MG capsule Take 30 mg by mouth at bedtime.     erythromycin ophthalmic ointment Place one application into the right eye at bedtime.     Evolocumab (REPATHA) 140 MG/ML  SOSY Inject 140 mg into the skin See admin instructions. Inject 140 mg subcutaneously every other Sunday evening     fenofibrate 160 MG tablet Take 1 tablet (160 mg total) by mouth daily. 30 tablet 1   fluticasone furoate-vilanterol (BREO ELLIPTA) 100-25 MCG/INH AEPB Inhale 1 puff into the lungs daily as needed (shortness of breath).     insulin regular human CONCENTRATED (HUMULIN R) 500 UNIT/ML KwikPen Inject 140 units 30 minutes before breakfast, 105 units 30 minutes before lunch, and 105 units 30 before dinner     levothyroxine (SYNTHROID) 125 MCG tablet Take 1 tablet (125 mcg total) by mouth daily at 6 (six) AM. 30 tablet 1   lipase/protease/amylase (CREON) 36000 UNITS CPEP capsule Take 1 capsule (36,000 Units total) by mouth 3 (three) times daily with meals. (Patient taking differently: Take 72,000 Units by mouth 3 (three) times daily with meals. Also take 1 capsule with snacks) 270 capsule 0   magnesium oxide (MAG-OX) 400 MG tablet Take by mouth.     Multiple Vitamin (MULTIVITAMIN WITH MINERALS) TABS tablet Take 1 tablet by mouth daily. 30 tablet 1   nitroGLYCERIN (NITROSTAT) 0.4 MG SL tablet Place 1 tablet (0.4 mg total) under the tongue every 5 (five) minutes as needed for up to 25 days for chest pain. 25 tablet 3   nystatin-triamcinolone (MYCOLOG II) cream nystatin-triamcinolone 100,000 unit/g-0.1 % topical cream  APPLY CREAM TOPICALLY TO AFFECTED AREA ONCE DAILY     ondansetron (ZOFRAN) 4 MG tablet Take 4 mg by mouth every 8 (eight) hours as needed for nausea or vomiting.     oxyCODONE-acetaminophen (PERCOCET) 10-325 MG tablet Take 1 tablet by mouth every 6 (six) hours as needed for pain. 20 tablet 0   pantoprazole (PROTONIX) 40 MG tablet Take 40 mg by mouth daily.     pregabalin (LYRICA) 100 MG capsule Take 200 mg by mouth in the morning.     pregabalin (LYRICA) 300 MG capsule Take 300 mg by mouth at bedtime.     prochlorperazine (COMPAZINE) 10 MG tablet Take 1 tablet (10 mg total) by  mouth every 6 (six) hours as needed for nausea or vomiting. 20 tablet 0   sacubitril-valsartan (ENTRESTO) 24-26 MG Take 1 tablet by mouth 2 (two) times daily. HOLD FOR THE NEXT FEW DAYS 60 tablet 11   spironolactone (ALDACTONE) 25 MG tablet Take 1 tablet (25 mg total) by mouth at bedtime. HOLD FOR THE NEXT FEW DAYS     torsemide (DEMADEX) 20 MG tablet Take 1 tablet (20 mg total) by mouth daily. HOLD FOR THE NEXT FEW DAYS     tretinoin (RETIN-A) 0.05 % cream Apply 1 application topically at bedtime.     No current facility-administered medications for this visit.  Allergies  Allergen Reactions   Ketamine Other (See Comments)    In vegetative state for 15 minutes per pt   Clarithromycin Other (See Comments)    Stomach pain    Fentanyl Nausea And Vomiting   Maitake Mushroom [M[place]ake] Itching    Itchy throat   Penicillin V Itching    Gi upset   Shellfish Allergy Swelling and Other (See Comments)    Throat swelling. Pt states contrast in CT is okay   Morphine Itching and Rash   Penicillins Rash    Has patient had a PCN reaction causing immediate rash, facial/tongue/throat swelling, SOB or lightheadedness with hypotension: Y Has patient had a PCN reaction causing severe rash involving mucus membranes or skin necrosis: Y Has patient had a PCN reaction that required hospitalization: N Has patient had a PCN reaction occurring within the last 10 years: Y If all of the above answers are "NO", then may proceed with Cephalosporin use.    Prednisone Rash     Review of Systems: All systems reviewed and negative except where noted in HPI.   Physical Exam: Ht 4' 9"  (1.448 m)   Wt 131 lb 4 oz (59.5 kg)   BMI 28.40 kg/m  Constitutional: Pleasant,well-developed, female in no acute distress. HEENT: Normocephalic and atraumatic. Conjunctivae are normal. No scleral icterus. Cardiovascular: Normal rate, regular rhythm.  Pulmonary/chest: Effort normal and breath sounds normal. No wheezing, rales  or rhonchi. Abdominal: Soft, nondistended, tender to palpation in scattered area around her abdomen. Bowel sounds active throughout. There are no masses palpable. No hepatomegaly. Extremities: No edema Neurological: Alert and oriented to person place and time. Skin: Skin is warm and dry. No rashes noted. Psychiatric: Normal mood and affect. Behavior is normal.  Labs 11/2020: TG 1034  Labs 01/2021: CMP with low Na of 124, BUN 46 (H), Cr 1.64 (H), glucose 326 (H), AST 125 (H), ALT 92 (H), alk phos 80, T bili 1.6 (H). CBC with mildly low Hb of 11.9, plts of 260. ProBNP of 33.6 (nml <100)  Gallbladder U/S 07/22/20: IMPRESSION:  1.  Borderline hepatic enlargement with steatosis.  2.  Additional findings as detailed above.   GES 08/25/20: FINDINGS: Activity is seen in the stomach on the immediate images and there is delayed washout of activity into the bowel over time. Gastric emptying is 53% at 4 hours IMPRESSION:  Delayed gastric emptying scan.   CT A/P w/contrast 11/18/20: Impression:  1.  Infiltrate or edema in the lung bases. Stable cardiomegaly.  2.  Hepatosplenomegaly. Hepatic steatosis.  3.  Normal CT appearance of the pancreas. However, recommend clinical correlation with amylase and lipase levels for evaluation of acute pancreatitis as clinically reported.   CT A/P w/contrast 11/22/20: IMPRESSION: 1. No CT findings to suggest acute pancreatitis. 2. Interval increase in hepatomegaly. 3. Hepatic steatosis. 4. Interval development of mild splenomegaly. 5. Atrophic uterus. 6. Similar-appearing mild circumferential hypodensity along the abdominal aorta and bilateral common iliac arteries. Finding may represent intimal thickening in the setting possible vasculitis.  CTA A/P w/contrast 11/24/20: IMPRESSION: VASCULAR  1. No evidence of acute or chronic mesenteric ischemia. 2. Mild distal aortic fibrofatty atherosclerotic plaque. Aortic Atherosclerosis (ICD10-I70.0). 3. Focal  fibrofatty atherosclerotic plaque results in mild stenosis and a small penetrating atherosclerotic ulceration in the left common iliac artery.  NON-VASCULAR  1. Hepatomegaly with advanced hepatic steatosis. 2. Mild dependent atelectasis  CTA PE 01/25/21: IMPRESSION: No acute pulmonary embolism.  Cardiomegaly with left ventricular dilation.  Fluid-filled esophagus suggesting gastroesophageal reflux  or esophageal dysmotility.  Moderate hepatic steatosis. Suspected hepatomegaly, incompletely evaluated on this examination.  EGD 01/08/20: Findings:  All observed locations appeared normal, including the esophagus, stomach  and duodenum. The esophagus, stomach and duodenum appeared normal.  Biopsies obtained of the antrum, incisura, lesser/greater curvature to  assess for H. Pylori.  Biopsies obtained of the duodenal bulb and descending duodenum to assess  for celiac disease.   ASSESSMENT AND PLAN:  Abdominal distension Gastroparesis Elevated LFTs Diarrhea Patient presents with significant abdominal distension starting in 11/2020 that has not been fully responsive to diuretic therapy. Also has had worsened diarrhea. Differential for her abdominal distension could include ascites fluid (due to pancreatic ascites, PVT, or portal HTN), gastroenteritis, gastroparesis. Patient was also interestingly noted to have possible vasculitis on abdominal imaging in 10/2020. At this time will plan to start off with CT A/P w/contrast to better delineate the source of her abdominal distension. Patient has had issues with elevated LFTs extending as far back as 2019. She had an acute worsening of her LFTs in 11/2020 suspected to be due to cardiogenic shock leading to ischemic hepatitis. Her LFTs have recovered since that period of time - Check ASMA, IgG, ferritin, iron/TIBC, ceruloplasmin, alpha-1 antitrypsin. Ferritin and iron/TIBC are not concerning for hemochromatosis. IgG is normal. - Check GI pathogen  panel, O&P - CT A/P w/contrast - Consider colonoscopy in the future to better evaluate the patient's diarrhea. - RTC 1 months  Christia Reading, MD  I spent 65 minutes of time, including in depth chart review, independent review of results as outlined above, communicating results with the patient directly, face-to-face time with the patient, coordinating care, ordering studies and medications as appropriate, and documentation.

## 2021-03-01 ENCOUNTER — Other Ambulatory Visit: Payer: Self-pay

## 2021-03-01 DIAGNOSIS — E038 Other specified hypothyroidism: Secondary | ICD-10-CM

## 2021-03-01 DIAGNOSIS — I10 Essential (primary) hypertension: Secondary | ICD-10-CM

## 2021-03-01 DIAGNOSIS — K7689 Other specified diseases of liver: Secondary | ICD-10-CM

## 2021-03-01 DIAGNOSIS — R748 Abnormal levels of other serum enzymes: Secondary | ICD-10-CM

## 2021-03-01 DIAGNOSIS — I509 Heart failure, unspecified: Secondary | ICD-10-CM

## 2021-03-01 DIAGNOSIS — Z85841 Personal history of malignant neoplasm of brain: Secondary | ICD-10-CM

## 2021-03-01 DIAGNOSIS — E1165 Type 2 diabetes mellitus with hyperglycemia: Secondary | ICD-10-CM

## 2021-03-01 NOTE — Progress Notes (Signed)
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 We will need to perform additional tests to see if she truly has this condition. Please have her do a 24 hour urine copper and refer to ophthalmology to evaluate an ocular slit lamp examination to rule out Kayser-Fleischer rings. Thanks!

## 2021-03-02 ENCOUNTER — Encounter (HOSPITAL_COMMUNITY): Payer: Self-pay | Admitting: Internal Medicine

## 2021-03-02 ENCOUNTER — Other Ambulatory Visit: Payer: Self-pay

## 2021-03-02 ENCOUNTER — Ambulatory Visit (HOSPITAL_COMMUNITY)
Admission: RE | Admit: 2021-03-02 | Discharge: 2021-03-02 | Disposition: A | Discharge status: Home / Self Care | Payer: BC Managed Care – PPO | Source: Ambulatory Visit | Attending: Internal Medicine | Admitting: Internal Medicine

## 2021-03-02 ENCOUNTER — Telehealth: Payer: Self-pay | Admitting: Internal Medicine

## 2021-03-02 VITALS — BP 120/76 | HR 115 | Wt 133.0 lb

## 2021-03-02 DIAGNOSIS — Z09 Encounter for follow-up examination after completed treatment for conditions other than malignant neoplasm: Secondary | ICD-10-CM | POA: Insufficient documentation

## 2021-03-02 DIAGNOSIS — R000 Tachycardia, unspecified: Secondary | ICD-10-CM | POA: Insufficient documentation

## 2021-03-02 DIAGNOSIS — N1831 Chronic kidney disease, stage 3a: Secondary | ICD-10-CM

## 2021-03-02 DIAGNOSIS — Z7901 Long term (current) use of anticoagulants: Secondary | ICD-10-CM | POA: Insufficient documentation

## 2021-03-02 DIAGNOSIS — R140 Abdominal distension (gaseous): Secondary | ICD-10-CM | POA: Diagnosis not present

## 2021-03-02 DIAGNOSIS — E781 Pure hyperglyceridemia: Secondary | ICD-10-CM | POA: Diagnosis not present

## 2021-03-02 DIAGNOSIS — I110 Hypertensive heart disease with heart failure: Secondary | ICD-10-CM | POA: Insufficient documentation

## 2021-03-02 DIAGNOSIS — R410 Disorientation, unspecified: Secondary | ICD-10-CM | POA: Diagnosis not present

## 2021-03-02 DIAGNOSIS — Z8719 Personal history of other diseases of the digestive system: Secondary | ICD-10-CM | POA: Diagnosis not present

## 2021-03-02 DIAGNOSIS — I5022 Chronic systolic (congestive) heart failure: Secondary | ICD-10-CM | POA: Diagnosis not present

## 2021-03-02 DIAGNOSIS — Z7982 Long term (current) use of aspirin: Secondary | ICD-10-CM | POA: Diagnosis not present

## 2021-03-02 DIAGNOSIS — R1900 Intra-abdominal and pelvic swelling, mass and lump, unspecified site: Secondary | ICD-10-CM | POA: Diagnosis not present

## 2021-03-02 DIAGNOSIS — Z79899 Other long term (current) drug therapy: Secondary | ICD-10-CM | POA: Diagnosis not present

## 2021-03-02 DIAGNOSIS — R0602 Shortness of breath: Secondary | ICD-10-CM | POA: Insufficient documentation

## 2021-03-02 DIAGNOSIS — K760 Fatty (change of) liver, not elsewhere classified: Secondary | ICD-10-CM | POA: Insufficient documentation

## 2021-03-02 DIAGNOSIS — Z794 Long term (current) use of insulin: Secondary | ICD-10-CM | POA: Diagnosis not present

## 2021-03-02 DIAGNOSIS — I2510 Atherosclerotic heart disease of native coronary artery without angina pectoris: Secondary | ICD-10-CM | POA: Insufficient documentation

## 2021-03-02 DIAGNOSIS — I3139 Other pericardial effusion (noninflammatory): Secondary | ICD-10-CM | POA: Insufficient documentation

## 2021-03-02 DIAGNOSIS — E108 Type 1 diabetes mellitus with unspecified complications: Secondary | ICD-10-CM | POA: Insufficient documentation

## 2021-03-02 NOTE — Progress Notes (Signed)
Advanced Heart Failure Clinic Note    PCP: Sue Lush, PA-C PCP-Cardiologist: Dr. Einar Gip HF Cardiologist: Dr. Haroldine Laws  HPI: Jennifer [name] is a 30 y.o. 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   Has been followed by Dr. Einar Gip for her HF.    Admitted 11/22/20 with ab pain and diarrhea. Ab CT concerning for vasculitis (no pancreatitis). Her TG were found to be  4030. She was started on an insulin/dextrose infusion. Cardiology and Nephrology were consulted after her creatinine and LFTs continued to rise and she began developing hypotension. Echo 11/28/20 EF was found to be 20-25% with moderate RV dysfunction and mild septal flattening. Started on DBA 2.5. AHF consulted for further management. PICC placed to follow CVP and Co-Ox. BP were high and milrinone added to augment diuresis. cMRI c/w prior myocarditis vs sarcoid (see below). Mod pericardial effusion also noted. LVEF 29%. RV normal. She underwent R/LHC showing single vessel RCA occlusion, RA 1, LVEDP 17, Fick CO 5.6/CI 3.88. She was able to be weaned off inotropes and GDMT titrated.    Post hospital follow up she was drinking >2L/day fluid and eating more salty foods, volume was up, ReDs 43%, instructed to increase lasix to 20 mg bid x 3 days. She followed up with [name] with [facility] 12/17/20 for a 2nd opinion. He placed LifeVest and switched beta blocker to Toprol XL.  Called after hours service with swelling, instructed to continue lasix 20 mg bid.  Seen in ED 12/25/20 for fall. She was standing at work and became dizzy. She fell backwards and hit her head. CT neg, labs showed K 5.7. She was given 1 dose of Lokelma. She denied palpitations.  Volume overloaded at follow up 01/06/21, lasix switched to torsemide. She had a LifeVest per Cardiology at  [facility].  Echo (10/22): EF up to 45-50%, grade II DD, RV ok. LifeVest off.  Treated in ED 10/22 for pancreatitis, then admitted 11/22 for hyperglycemia, did not meet criteria for DKA.  Hospitalization c/b hyponatremia, sodium 118. Concern for volume overload. Lasix restarted but SCr increased. Nephrology consulted and restarted gentle IVF and GDMT held for a couple of days. Recommendation for Tolvaptan, however patient declined and requested discharge with outpatient follow-up her MD at [facility]. Jardiance stopped at discharge, weight 129 lbs.   Seen back in ED a few days later with increased weight and abd distention. Given IV lasix in ED and torsemide increased to 40 bid.  At last visit 11/22, her breathing had improved but she complained of abdominal swelling/bloating, not felt to be related to volume overload. Concern for possible gastroparesis. She was referred to GI. They recommended abdominal CT. This is scheduled for later this week on 12/9.  Ceruloplasmin level was also low, raising concern for possible Wilson's Disease. Follow-up 24 hour urine copper test ordered, not yet completed.   She returns now for routine f/u. Here w/ her husband. Continues w/ abdominal swelling bloating. No improvement. Affecting her breathing. SOB w/ exertion and at rest. 2-3 pillow orthopnea. Poor appetite and nausea. Minimal response to torsemide (only on 20 mg daily). There is confusion regarding some of her meds. Only taking Entresto once daily instead of BID. Has been doing this for the last 2 weeks. Also hasn't been complaint w/ Corlanor. EKG shows sinus tach 110 bpm. BP 120/70    Cardiac Studies: -  R/LHC 12/03/20:  Normal right heart catheterization pressures.   RA 4/1, mean 1;  RV 23/4  EDP 6  PA 22/8, mean 11 PA saturation 76%.   PW 9/7, mean 7 mmHg.  CO 5.6, CI 3.88.   QP/QS 1.00.  LV: 136/9 EDP 17 mmHg.   Ao 131/86, mean 105 mmHg.  No pressure gradient across the aortic valve.  RCA: A very small but  dominant RCA which is diffusely diseased around 80%.  Mid segment 99% occlusion.  Conus branch collateralizes distal RCA. LM: Large, smooth and normal. LAD: Large vessel, minimal diffuse disease.  Small to moderate-sized D1 and D2. CX: Large vessel.  Minimal diffuse disease.  Gives origin to large OM1 and OM 2.   Impression: Nonischemic cardiomyopathy, single vessel occlusion does not explain her markedly reduced LVEF.  Medical therapy for CAD and nonischemic cardiomyopathy.    - cMRI (12/01/20): LVEF 76% severe systolic dysfunction, normal RV size and systolic function, RV EF 72%, moderate pericardial effusion, midwall LGE basal to mid ant/anterolateral walls (myocarditis vs sarcoidosis; rec cardiac PET to evaluate for sarcoid).  - Echo (1/21): EF 40%. Mod MR/TR (Dr. Einar Gip) - Lexiscan (2/21): EF 38% Normal perfusion - Echo (1/22): EF 35-40% Mod MR  - cMRI  08/04/2017 [facility]): LVEF 42% mod decreased, mild decrease RV function, findings suggestive of myocarditis and infarction, small circumferential pericardial effusion.  Past Medical History:  Diagnosis Date   Brain tumor ([place]) 03/29/1995   astrocytoma   CHF (congestive heart failure) (HCC)    Cholesterosis    DM (diabetes mellitus) ([place]) 10/10/2018   Fatty liver    HTN (hypertension) 10/10/2018   Hypothyroidism 10/10/2018   Lipoprotein deficiency    Lung disease    longevity long term   Pancreatitis    Polycystic ovary syndrome    Current Outpatient Medications  Medication Sig Dispense Refill   albuterol (VENTOLIN HFA) 108 (90 Base) MCG/ACT inhaler Inhale 2 puffs into the lungs every 6 (six) hours as needed for wheezing or shortness of breath.     aspirin EC 81 MG EC tablet Take 1 tablet (81 mg total) by mouth daily. Swallow whole. 30 tablet 1   carvedilol (COREG) 3.125 MG tablet Take 3.125 mg by mouth 2 (two) times daily.     cholecalciferol (VITAMIN D) 25 MCG tablet Take 1 tablet (1,000 Units total) by mouth daily. 30 tablet 1    Cyanocobalamin (VITAMIN B12 PO) Take 1 drop by mouth every morning. liquid     DULoxetine (CYMBALTA) 30 MG capsule Take 30 mg by mouth at bedtime.     erythromycin ophthalmic ointment Place one application into the right eye at bedtime.     Evolocumab (REPATHA) 140 MG/ML SOSY Inject 140 mg into the skin See admin instructions. Inject 140 mg subcutaneously every other Sunday evening     fenofibrate 160 MG tablet Take 1 tablet (160 mg total) by mouth daily. 30 tablet 1   fluticasone furoate-vilanterol (BREO ELLIPTA) 100-25 MCG/INH AEPB Inhale 1 puff into the lungs daily as needed (shortness of breath).     insulin regular human CONCENTRATED (HUMULIN R) 500 UNIT/ML KwikPen Patient inject 170 u before breakfast and 140 u before lunch and dinner.     levothyroxine (SYNTHROID) 125 MCG tablet Take 1 tablet (125 mcg total) by mouth daily at 6 (six) AM. 30 tablet 1   lipase/protease/amylase (CREON) 36000 UNITS CPEP capsule Take 1 capsule (36,000 Units total) by mouth 3 (three) times daily with meals. (Patient taking differently:  Take 72,000 Units by mouth 3 (three) times daily with meals. Also take 1 capsule with snacks) 270 capsule 0   magnesium oxide (MAG-OX) 400 MG tablet Take by mouth.     Multiple Vitamin (MULTIVITAMIN WITH MINERALS) TABS tablet Take 1 tablet by mouth daily. 30 tablet 1   nitroGLYCERIN (NITROSTAT) 0.4 MG SL tablet Place 1 tablet (0.4 mg total) under the tongue every 5 (five) minutes as needed for up to 25 days for chest pain. 25 tablet 3   nystatin-triamcinolone (MYCOLOG II) cream nystatin-triamcinolone 100,000 unit/g-0.1 % topical cream  APPLY CREAM TOPICALLY TO AFFECTED AREA ONCE DAILY     ondansetron (ZOFRAN) 4 MG tablet Take 4 mg by mouth every 8 (eight) hours as needed for nausea or vomiting.     oxyCODONE-acetaminophen (PERCOCET) 10-325 MG tablet Take 1 tablet by mouth every 6 (six) hours as needed for pain. 20 tablet 0   pantoprazole (PROTONIX) 40 MG tablet Take 40 mg by mouth  daily.     pregabalin (LYRICA) 100 MG capsule Take 200 mg by mouth in the morning.     pregabalin (LYRICA) 300 MG capsule Take 300 mg by mouth at bedtime.     prochlorperazine (COMPAZINE) 10 MG tablet Take 1 tablet (10 mg total) by mouth every 6 (six) hours as needed for nausea or vomiting. 20 tablet 0   sacubitril-valsartan (ENTRESTO) 24-26 MG Take 1 tablet by mouth daily.     spironolactone (ALDACTONE) 25 MG tablet Patient takes 0.5 tablet daily.     torsemide (DEMADEX) 20 MG tablet Take 20 mg by mouth daily.     tretinoin (RETIN-A) 0.05 % cream Apply 1 application topically at bedtime.     No current facility-administered medications for this encounter.   Allergies  Allergen Reactions   Ketamine Other (See Comments)    In vegetative state for 15 minutes per pt   Clarithromycin Other (See Comments)    Stomach pain    Fentanyl Nausea And Vomiting   Maitake Mushroom [Maitake] Itching    Itchy throat   Penicillin V Itching    Gi upset   Shellfish Allergy Swelling and Other (See Comments)    Throat swelling. Pt states contrast in CT is okay   Morphine Itching and Rash   Penicillins Rash    Has patient had a PCN reaction causing immediate rash, facial/tongue/throat swelling, SOB or lightheadedness with hypotension: Y Has patient had a PCN reaction causing severe rash involving mucus membranes or skin necrosis: Y Has patient had a PCN reaction that required hospitalization: N Has patient had a PCN reaction occurring within the last 10 years: Y If all of the above answers are "NO", then may proceed with Cephalosporin use.    Prednisone Rash   Social History   Socioeconomic History   Marital status: Married    Spouse name: Not on file   Number of children: 0   Years of education: Not on file   Highest education level: Not on file  Occupational History   Occupation: [employer]  Tobacco Use   Smoking status: Never   Smokeless tobacco: Never  Vaping Use   Vaping Use: Never  used  Substance and Sexual Activity   Alcohol use: Never   Drug use: Never   Sexual activity: Not on file  Other Topics Concern   Not on file  Social History Narrative   Not on file   Social Determinants of Health   Financial Resource Strain: Low Risk  Difficulty of Paying Living Expenses: Not very hard  Food Insecurity: No Food Insecurity   Worried About Running Out of Food in the Last Year: Never true   Ran Out of Food in the Last Year: Never true  Transportation Needs: No Transportation Needs   Lack of Transportation (Medical): No   Lack of Transportation (Non-Medical): No  Physical Activity: Not on file  Stress: Not on file  Social Connections: Not on file  Intimate Partner Violence: Not on file   Family History  Problem Relation Age of Onset   Diabetes Mother    Hypertension Mother    Hyperlipidemia Mother    Thyroid disease Mother    Hypertension Father    Diabetes Father    Pancreatic cancer Paternal Aunt    Pancreatic cancer Paternal Uncle    Colon cancer Neg Hx    Esophageal cancer Neg Hx    Stomach cancer Neg Hx    BP 120/76   Pulse (!) 115   Wt 60.3 kg (133 lb)   SpO2 98%   BMI 28.78 kg/m   Wt Readings from Last 3 Encounters:  03/02/21 60.3 kg (133 lb)  02/26/21 59.5 kg (131 lb 4 oz)  02/12/21 58 kg (127 lb 12.8 oz)   PHYSICAL EXAM: General:  chronically ill appearing young WM. No respiratory difficulty HEENT: right eye ptosis, otherwise normal Neck: supple. No JVD. Carotids 2+ bilat; no bruits. No lymphadenopathy or thyromegaly appreciated. Cor: PMI nondisplaced. Regular rhythm and tachy rate. No rubs, gallops or murmurs. Lungs: clear Abdomen: abdomen distended, soft, nontender. No hepatosplenomegaly. No bruits or masses. Good bowel sounds. Extremities: no cyanosis, clubbing, rash, edema Neuro: alert & oriented x 3, cranial nerves grossly intact. moves all 4 extremities w/o difficulty. Affect pleasant.  ReDs: 33%   ECG: sinus tach 110 bpm  (personally reviewed).  ASSESSMENT & PLAN: 1. Chronic Systolic Heart Failure - likely NICM. - Echo (1/22): EF 35-40% Mod MR - Echo (11/28/20): EF 20-25% with moderate RV dysfunction and mild septal flattening. Mod-severe TR - Rheum serologies negative, but Rheumatoid factor mildly elevated at 15.5 - cMRI  9/22 EF 29%c/w prior myocarditis vs sarcoid. May have delayed chemo cardiotoxcity.  - R/LHC with single vessel RCA occlusion, RA 1, LVEDP 17, Fick CO 5.6/CI 3.88. - Echo (10/22): EF 45-50%, grade II DD, RV ok. - Stable NYHA II. Volume status good. ReDs Clip 33%  - Continue torsemide 20 mg daily - Continue carvedilol 3.125 mg bid. - Continue spiro 25 mg daily. - Increase Entresto back to 24/26 mg bid. - Resume Corlanor  - Off Digoxin w/ improved EF  - [name] off Jardiance due to SE, per Endocrinologist. - [name] contracepting with OCPs, will need to avoid pregnancy with teratogenicity of current HF meds. - Needs updated BMP (will get at Endocrinologist f/u next week)   2. CAD - Single vessel RCA occlusion (small co-dominant vessel) on cardiac cath 09/08. - Medical management.  - No s/s ischemia.  - Continue aspirin + statin.   3. Severe hyperTG - Due to LPL deficiency w/ recent pancreatitis. - Continue fenofibrate + niacin  - Previously offered referral to [facility] for further management. She prefers to continue with her endocrinologist at [facility] who currently manages/follows.   4. Type I DM - Follows w/ endocrinology at [facility] - Off SGLT2i.  5. Abdominal swelling/bloating - Seen at [facility] (11/20) for IBS, per chart review. - Had penumatosis intestinalis. She was started on pancreatic enzyme replacement. - EGD (  10/21): normal, bx taken to assess for celiac. - CT abdomen (2/22) and RUQ US showed no obvious gallbladder stones. - HIDA (4/22): ended early due to tachypnea, but cystic duct and gallbladder appeared WNL. - Recent referral to [facility]. Concern for possible  Wilson's disease. Additional testing and CT of abdomen pending.  - suspect primarily fatty liver   Lyda Jester, PA-C 03/02/21   Patient seen and examined with the above-signed Advanced Practice Provider and/or Housestaff. I personally reviewed laboratory data, imaging studies and relevant notes. I independently examined the patient and formulated the important aspects of the plan. I have edited the note to reflect any of my changes or salient points. I have personally discussed the plan with the patient and/or family.  Continues to struggle with abdominal symptoms. Being worked up by GI. Repeat CT pending. Volume status remains well controlled. Denies CP.   General:  Sitting on exam table No resp difficulty HEENT: normal x for R eye ptosis Neck: supple. no JVD. Carotids 2+ bilat; no bruits. No lymphadenopathy or thryomegaly appreciated. Cor: PMI nondisplaced. Tachy regular rate & rhythm. No rubs, gallops or murmurs. Lungs: clear Abdomen: soft, nontender, + mildly distended. No hepatosplenomegaly. No bruits or masses. Good bowel sounds. Extremities: no cyanosis, clubbing, rash, edema Neuro: alert & orientedx3, cranial nerves grossly intact. moves all 4 extremities w/o difficulty. Affect pleasant  Stable NYHA II-III. Volume status ok. Remains tachycardic but hasn't been taking ivabradine. Will have her restart also increase Entresto back to bid. Stressed need for med compliance. She has labs pending next week.   Not candidate for advanced therapies currently given comorbidities.   Glori Bickers, MD  10:32 PM

## 2021-03-02 NOTE — Telephone Encounter (Signed)
Addressed in previously created encounter

## 2021-03-02 NOTE — Patient Instructions (Signed)
Medication Changes:  No change  Lab Work:  none  Testing/Procedures:  none  Referrals:  none  Special Instructions // Education:  none  Follow-Up in: 5 months (May 2023) ** Call office in April for appointment**  At the [facility], you and your health needs are our priority. We have a designated team specialized in the treatment of Heart Failure. This Care Team includes your primary Heart Failure Specialized Cardiologist (physician), Advanced Practice Providers (APPs- Physician Assistants and Nurse Practitioners), and Pharmacist who all work together to provide you with the care you need, when you need it.   You may see any of the following providers on your designated Care Team at your next follow up:  Dr Glori Bickers Dr Haynes Kerns, NP Lyda Jester, [place] [facility] [place], [place] Audry Riles, PharmD   Please be sure to bring in all your medications bottles to every appointment.   Need to Contact us:  If you have any questions or concerns before your next appointment please send [place] a message through [place] or call our office at [phone].    TO LEAVE A MESSAGE FOR THE NURSE SELECT OPTION 2, PLEASE LEAVE A MESSAGE INCLUDING: YOUR NAME DATE OF BIRTH CALL BACK NUMBER REASON FOR CALL**this is important as we prioritize the call backs  YOU WILL RECEIVE A CALL BACK THE SAME DAY AS LONG AS YOU CALL BEFORE 4:00 PM

## 2021-03-02 NOTE — Progress Notes (Signed)
ReDS Vest / Clip - 03/02/21 1500       ReDS Vest / Clip   Station Marker B    Ruler Value 30.5    ReDS Value Range Low volume    ReDS Actual Value 33

## 2021-03-02 NOTE — Telephone Encounter (Signed)
Patient returned call about lab results.

## 2021-03-03 LAB — IGG: IgG (Immunoglobin G), Serum: 686 mg/dL (ref 600–1640)

## 2021-03-03 LAB — ALPHA-1-ANTITRYPSIN: A-1 Antitrypsin, Ser: 130 mg/dL (ref 83–199)

## 2021-03-03 LAB — ANTI-SMOOTH MUSCLE ANTIBODY, IGG

## 2021-03-03 LAB — CERULOPLASMIN: Ceruloplasmin: <2 mg/dL — ABNORMAL LOW (ref 18–53)

## 2021-03-04 ENCOUNTER — Telehealth: Payer: Self-pay

## 2021-03-04 NOTE — Telephone Encounter (Signed)
The lab called about patient's Anti-smooth muscle antibody. The received a message from [place], that they could not do this test due to her having too much fat in her blood, which she apparently has a history of. They suggested that it is possible that she can fast 12 hours prior to having this done and they may be able to get a result.

## 2021-03-05 ENCOUNTER — Ambulatory Visit (INDEPENDENT_AMBULATORY_CARE_PROVIDER_SITE_OTHER)
Admission: RE | Admit: 2021-03-05 | Discharge: 2021-03-05 | Disposition: A | Discharge status: Home / Self Care | Payer: BC Managed Care – PPO | Source: Ambulatory Visit | Attending: Internal Medicine | Admitting: Internal Medicine

## 2021-03-05 ENCOUNTER — Other Ambulatory Visit: Payer: Self-pay

## 2021-03-05 DIAGNOSIS — K3184 Gastroparesis: Secondary | ICD-10-CM | POA: Diagnosis not present

## 2021-03-05 DIAGNOSIS — R197 Diarrhea, unspecified: Secondary | ICD-10-CM

## 2021-03-05 DIAGNOSIS — R140 Abdominal distension (gaseous): Secondary | ICD-10-CM | POA: Diagnosis not present

## 2021-03-05 DIAGNOSIS — R7989 Other specified abnormal findings of blood chemistry: Secondary | ICD-10-CM

## 2021-03-05 MED ORDER — IOHEXOL 300 MG/ML  SOLN
100.0000 mL | Freq: Once | INTRAMUSCULAR | Status: AC | PRN
  Administered 2021-03-05: 80 mL via INTRAVENOUS

## 2021-03-05 MED ORDER — IOHEXOL 300 MG/ML  SOLN: 100.0000 mL | Freq: Once | INTRAMUSCULAR | Status: DC | PRN

## 2021-03-09 ENCOUNTER — Other Ambulatory Visit: Payer: Self-pay

## 2021-03-09 ENCOUNTER — Observation Stay (HOSPITAL_BASED_OUTPATIENT_CLINIC_OR_DEPARTMENT_OTHER)
Admission: EM | Admit: 2021-03-09 | Discharge: 2021-03-11 | Disposition: A | Discharge status: Home / Self Care | Payer: BC Managed Care – PPO | Attending: Internal Medicine | Admitting: Internal Medicine

## 2021-03-09 ENCOUNTER — Encounter (HOSPITAL_BASED_OUTPATIENT_CLINIC_OR_DEPARTMENT_OTHER): Payer: Self-pay | Admitting: Obstetrics and Gynecology

## 2021-03-09 ENCOUNTER — Emergency Department (HOSPITAL_BASED_OUTPATIENT_CLINIC_OR_DEPARTMENT_OTHER): Payer: BC Managed Care – PPO | Admitting: Radiology

## 2021-03-09 ENCOUNTER — Emergency Department (HOSPITAL_BASED_OUTPATIENT_CLINIC_OR_DEPARTMENT_OTHER): Payer: BC Managed Care – PPO

## 2021-03-09 DIAGNOSIS — I5022 Chronic systolic (congestive) heart failure: Principal | ICD-10-CM | POA: Insufficient documentation

## 2021-03-09 DIAGNOSIS — Z20822 Contact with and (suspected) exposure to covid-19: Secondary | ICD-10-CM | POA: Insufficient documentation

## 2021-03-09 DIAGNOSIS — R778 Other specified abnormalities of plasma proteins: Secondary | ICD-10-CM

## 2021-03-09 DIAGNOSIS — R112 Nausea with vomiting, unspecified: Secondary | ICD-10-CM

## 2021-03-09 DIAGNOSIS — Z79899 Other long term (current) drug therapy: Secondary | ICD-10-CM | POA: Diagnosis not present

## 2021-03-09 DIAGNOSIS — R739 Hyperglycemia, unspecified: Secondary | ICD-10-CM

## 2021-03-09 DIAGNOSIS — I130 Hypertensive heart and chronic kidney disease with heart failure and stage 1 through stage 4 chronic kidney disease, or unspecified chronic kidney disease: Secondary | ICD-10-CM | POA: Insufficient documentation

## 2021-03-09 DIAGNOSIS — Z794 Long term (current) use of insulin: Secondary | ICD-10-CM | POA: Diagnosis not present

## 2021-03-09 DIAGNOSIS — R079 Chest pain, unspecified: Secondary | ICD-10-CM | POA: Diagnosis present

## 2021-03-09 DIAGNOSIS — E1122 Type 2 diabetes mellitus with diabetic chronic kidney disease: Secondary | ICD-10-CM | POA: Diagnosis not present

## 2021-03-09 DIAGNOSIS — N183 Chronic kidney disease, stage 3 unspecified: Secondary | ICD-10-CM | POA: Diagnosis not present

## 2021-03-09 DIAGNOSIS — Z7982 Long term (current) use of aspirin: Secondary | ICD-10-CM | POA: Insufficient documentation

## 2021-03-09 LAB — TROPONIN I (HIGH SENSITIVITY): Troponin I (High Sensitivity): 13 ng/L (ref <18)

## 2021-03-09 LAB — PREGNANCY, URINE: Preg Test, Ur: NEGATIVE

## 2021-03-09 LAB — BASIC METABOLIC PANEL
Anion gap: 12 (ref 5–15)
BUN: 27 mg/dL — ABNORMAL HIGH (ref 6–20)
CO2: 22 mmol/L (ref 22–32)
Calcium: 9.9 mg/dL (ref 8.9–10.3)
Chloride: 89 mmol/L — ABNORMAL LOW (ref 98–111)
Creatinine, Ser: 1.66 mg/dL — ABNORMAL HIGH (ref 0.44–1.00)
GFR, Estimated: 43 mL/min — ABNORMAL LOW (ref >60)
Glucose, Bld: 500 mg/dL — ABNORMAL HIGH (ref 70–99)
Potassium: 5.1 mmol/L (ref 3.5–5.1)
Sodium: 123 mmol/L — ABNORMAL LOW (ref 135–145)

## 2021-03-09 LAB — HEPATIC FUNCTION PANEL
ALT: 74 U/L — ABNORMAL HIGH (ref 0–44)
AST: 82 U/L — ABNORMAL HIGH (ref 15–41)
Albumin: 4.4 g/dL (ref 3.5–5.0)
Alkaline Phosphatase: 62 U/L (ref 38–126)
Bilirubin, Direct: <0.1 mg/dL (ref 0.0–0.2)
Total Bilirubin: 0.8 mg/dL (ref 0.3–1.2)
Total Protein: 7.8 g/dL (ref 6.5–8.1)

## 2021-03-09 LAB — CBC
HCT: 37.6 % (ref 36.0–46.0)
Hemoglobin: 12.8 g/dL (ref 12.0–15.0)
MCH: 29.6 pg (ref 26.0–34.0)
MCHC: 34 g/dL (ref 30.0–36.0)
MCV: 87 fL (ref 80.0–100.0)
Platelets: 201 10*3/uL (ref 150–400)
RBC: 4.32 MIL/uL (ref 3.87–5.11)
RDW: 15.7 % — ABNORMAL HIGH (ref 11.5–15.5)
WBC: 7.5 10*3/uL (ref 4.0–10.5)
nRBC: 0 % (ref 0.0–0.2)

## 2021-03-09 LAB — RESP PANEL BY RT-PCR (FLU A&B, COVID) ARPGX2
Influenza A by PCR: NEGATIVE
Influenza B by PCR: NEGATIVE
SARS Coronavirus 2 by RT PCR: NEGATIVE

## 2021-03-09 LAB — BRAIN NATRIURETIC PEPTIDE: B Natriuretic Peptide: 37.9 pg/mL (ref 0.0–100.0)

## 2021-03-09 LAB — MAGNESIUM: Magnesium: 2.4 mg/dL (ref 1.7–2.4)

## 2021-03-09 LAB — D-DIMER, QUANTITATIVE: D-Dimer, Quant: 0.85 ug/mL-FEU — ABNORMAL HIGH (ref 0.00–0.50)

## 2021-03-09 MED ORDER — MORPHINE SULFATE (PF) 4 MG/ML IV SOLN
4.0000 mg | Freq: Once | INTRAVENOUS | Status: AC
  Administered 2021-03-09: 4 mg via INTRAVENOUS
  Filled 2021-03-09: qty 1

## 2021-03-09 MED ORDER — NITROGLYCERIN 0.4 MG SL SUBL
0.4000 mg | SUBLINGUAL_TABLET | SUBLINGUAL | Status: DC | PRN
  Administered 2021-03-09: 22:00:00 0.4 mg via SUBLINGUAL
  Filled 2021-03-09 (×3): qty 1

## 2021-03-09 MED ORDER — IOHEXOL 350 MG/ML SOLN
60.0000 mL | Freq: Once | INTRAVENOUS | Status: AC | PRN
  Administered 2021-03-09: 60 mL via INTRAVENOUS

## 2021-03-09 MED ORDER — ONDANSETRON HCL 4 MG/2ML IJ SOLN
4.0000 mg | Freq: Once | INTRAMUSCULAR | Status: AC
  Administered 2021-03-09: 4 mg via INTRAVENOUS
  Filled 2021-03-09: qty 2

## 2021-03-09 MED ORDER — INSULIN REGULAR HUMAN 100 UNIT/ML IJ SOLN
20.0000 [IU] | Freq: Once | INTRAMUSCULAR | Status: DC
  Filled 2021-03-09: qty 3

## 2021-03-09 MED ORDER — SODIUM CHLORIDE 0.9 % IV BOLUS
500.0000 mL | Freq: Once | INTRAVENOUS | Status: AC
  Administered 2021-03-10: 500 mL via INTRAVENOUS

## 2021-03-09 MED ORDER — INSULIN ASPART 100 UNIT/ML IJ SOLN
20.0000 [IU] | Freq: Once | INTRAMUSCULAR | Status: AC
  Administered 2021-03-09: 20 [IU] via INTRAVENOUS

## 2021-03-09 NOTE — ED Notes (Signed)
Pt informed the MD that she can have IV Morphine but not PO Morphine. PO Morphine causes the allergy.

## 2021-03-09 NOTE — ED Triage Notes (Signed)
Patient reports to the ER for CHF exacerbation and chest pain. Patient reports she has gained weight with the fluid. Patient states over the past months she has gained 15lbs.

## 2021-03-09 NOTE — ED Provider Notes (Signed)
[facility] EMERGENCY DEPT Provider Note   CSN: [id number] Arrival date & time: 03/09/21  2106     History Chief Complaint  Patient presents with   Congestive Heart Failure   Chest Pain    Jennifer [name] is a 30 y.o. female.   Congestive Heart Failure Associated symptoms include chest pain and shortness of breath. Pertinent negatives include no abdominal pain and no headaches. Nothing aggravates the symptoms. She has tried nothing for the symptoms. The treatment provided no relief.  Chest Pain Pain location:  Substernal area Pain quality: aching   Pain radiates to:  Does not radiate Pain severity:  Moderate Onset quality:  Gradual Timing:  Constant Progression:  Waxing and waning Relieved by:  Nothing Worsened by:  Nothing Associated symptoms: cough, fatigue, nausea, shortness of breath and vomiting   Associated symptoms: no abdominal pain, no altered mental status, no back pain, no diaphoresis, no fever, no headache, no lower extremity edema, no near-syncope, no palpitations and no syncope       Past Medical History:  Diagnosis Date   Brain tumor ([place]) 03/29/1995   astrocytoma   CHF (congestive heart failure) ([place])    Cholesterosis    DM (diabetes mellitus) ([place]) 10/10/2018   Fatty liver    HTN (hypertension) 10/10/2018   Hypothyroidism 10/10/2018   Lipoprotein deficiency    Lung disease    longevity long term   Pancreatitis    Polycystic ovary syndrome     Patient Active Problem List   Diagnosis Date Noted   Acute on chronic combined systolic and diastolic CHF (congestive heart failure) ([place]) 02/09/2021   History of astrocytoma of brain 02/09/2021   Severe hyperglycemia due to diabetes mellitus ([place]) 01/25/2021   Diarrhea    Shock liver    Hypotension 11/28/2020   Acute combined systolic and diastolic heart failure (HCC)    Nonischemic cardiomyopathy (HCC)    Acute decompensated heart failure (HCC)    Elevated liver enzymes     Cardiogenic shock (HCC)    AKI (acute kidney injury) ([place]) 11/26/2020   Abdominal pain 78/46/9629   Chronic systolic CHF (congestive heart failure) ([place]) 11/23/2020   Essential hypertension 11/23/2020   Hypertriglyceridemia 11/23/2020   Prolonged QT interval 11/23/2020   DM (diabetes mellitus) ([place]) 10/10/2018   Hypothyroidism 10/10/2018    Past Surgical History:  Procedure Laterality Date   ABDOMINAL SURGERY     pt states during miscarriage got her intestine   BRAIN SURGERY     EYE MUSCLE SURGERY Right 03/28/2014   RIGHT/LEFT HEART CATH AND CORONARY ANGIOGRAPHY N/A 12/03/2020   Procedure: RIGHT/LEFT HEART CATH AND CORONARY ANGIOGRAPHY;  Surgeon: [name], MD;  Location: [facility];  Service: Cardiovascular;  Laterality: N/A;   VENTRICULOSTOMY  03/28/1997     OB History   No obstetric history on file.     Family History  Problem Relation Age of Onset   Diabetes Mother    Hypertension Mother    Hyperlipidemia Mother    Thyroid disease Mother    Hypertension Father    Diabetes Father    Pancreatic cancer Paternal Aunt    Pancreatic cancer Paternal Uncle    Colon cancer Neg Hx    Esophageal cancer Neg Hx    Stomach cancer Neg Hx     Social History   Tobacco Use   Smoking status: Never   Smokeless tobacco: Never  Vaping Use   Vaping Use: Never used  Substance Use Topics   Alcohol  use: Never   Drug use: Never    Home Medications Prior to Admission medications   Medication Sig Start Date End Date Taking? Authorizing Provider  albuterol (VENTOLIN HFA) 108 (90 Base) MCG/ACT inhaler Inhale 2 puffs into the lungs every 6 (six) hours as needed for wheezing or shortness of breath. 12/31/18   [provider]  aspirin EC 81 MG EC tablet Take 1 tablet (81 mg total) by mouth daily. Swallow whole. 12/07/20   Ezekiel Slocumb, DO  carvedilol (COREG) 3.125 MG tablet Take 3.125 mg by mouth 2 (two) times daily. 02/03/21   [provider]  cholecalciferol  (VITAMIN D) 25 MCG tablet Take 1 tablet (1,000 Units total) by mouth daily. 12/07/20   Nicole Kindred A, DO  Cyanocobalamin (VITAMIN B12 PO) Take 1 drop by mouth every morning. liquid    [provider]  DULoxetine (CYMBALTA) 30 MG capsule Take 30 mg by mouth at bedtime. 01/28/19 12/16/21  [provider]  erythromycin ophthalmic ointment Place one application into the right eye at bedtime. 03/25/20   [provider]  Evolocumab (REPATHA) 140 MG/ML SOSY Inject 140 mg into the skin See admin instructions. Inject 140 mg subcutaneously every other Sunday evening    [provider]  fenofibrate 160 MG tablet Take 1 tablet (160 mg total) by mouth daily. 12/07/20   Nicole Kindred A, DO  fluticasone furoate-vilanterol (BREO ELLIPTA) 100-25 MCG/INH AEPB Inhale 1 puff into the lungs daily as needed (shortness of breath). 01/21/19   [provider]  insulin regular human CONCENTRATED (HUMULIN R) 500 UNIT/ML KwikPen Patient inject 170 u before breakfast and 140 u before lunch and dinner. 01/28/21   [provider]  levothyroxine (SYNTHROID) 125 MCG tablet Take 1 tablet (125 mcg total) by mouth daily at 6 (six) AM. 12/07/20   Ezekiel Slocumb, DO  lipase/protease/amylase (CREON) 36000 UNITS CPEP capsule Take 1 capsule (36,000 Units total) by mouth 3 (three) times daily with meals. Patient taking differently: Take 72,000 Units by mouth 3 (three) times daily with meals. Also take 1 capsule with snacks 12/06/20   Nicole Kindred A, DO  magnesium oxide (MAG-OX) 400 MG tablet Take by mouth. 06/24/20   [provider]  Multiple Vitamin (MULTIVITAMIN WITH MINERALS) TABS tablet Take 1 tablet by mouth daily. 12/07/20   Ezekiel Slocumb, DO  nitroGLYCERIN (NITROSTAT) 0.4 MG SL tablet Place 1 tablet (0.4 mg total) under the tongue every 5 (five) minutes as needed for up to 25 days for chest pain. 08/01/19 01/06/22  [name], MD  nystatin-triamcinolone (MYCOLOG II)  cream nystatin-triamcinolone 100,000 unit/g-0.1 % topical cream  APPLY CREAM TOPICALLY TO AFFECTED AREA ONCE DAILY    [provider]  ondansetron (ZOFRAN) 4 MG tablet Take 4 mg by mouth every 8 (eight) hours as needed for nausea or vomiting. 12/19/18   [provider]  oxyCODONE-acetaminophen (PERCOCET) 10-325 MG tablet Take 1 tablet by mouth every 6 (six) hours as needed for pain. 01/18/21   Molpus, John, MD  pantoprazole (PROTONIX) 40 MG tablet Take 40 mg by mouth daily. 12/24/19   [provider]  pregabalin (LYRICA) 100 MG capsule Take 200 mg by mouth in the morning. 10/09/20   [provider]  pregabalin (LYRICA) 300 MG capsule Take 300 mg by mouth at bedtime.    [provider]  prochlorperazine (COMPAZINE) 10 MG tablet Take 1 tablet (10 mg total) by mouth every 6 (six) hours as needed for nausea or vomiting. 01/18/21  Molpus, John, MD  sacubitril-valsartan (ENTRESTO) 24-26 MG Take 1 tablet by mouth daily.    [provider]  spironolactone (ALDACTONE) 25 MG tablet Patient takes 0.5 tablet daily.    [provider]  torsemide (DEMADEX) 20 MG tablet Take 20 mg by mouth daily.    [provider]  tretinoin (RETIN-A) 0.05 % cream Apply 1 application topically at bedtime. 01/17/21   [provider]    Allergies    Ketamine, Clarithromycin, Fentanyl, Maitake mushroom [maitake], Penicillin v, Shellfish allergy, Morphine, Penicillins, and Prednisone  Review of Systems   Review of Systems  Constitutional:  Positive for fatigue. Negative for chills, diaphoresis and fever.  HENT:  Negative for congestion.   Eyes:  Negative for visual disturbance.  Respiratory:  Positive for cough, chest tightness and shortness of breath. Negative for wheezing.   Cardiovascular:  Positive for chest pain. Negative for palpitations, leg swelling, syncope and near-syncope.  Gastrointestinal:  Positive for nausea and vomiting. Negative for  abdominal pain, constipation and diarrhea.  Genitourinary:  Negative for dysuria and flank pain.  Musculoskeletal:  Negative for back pain.  Neurological:  Negative for light-headedness and headaches.  Psychiatric/Behavioral:  Negative for agitation and confusion.   All other systems reviewed and are negative.  Physical Exam Updated Vital Signs BP (!) 188/106 (BP Location: Left Arm)    Pulse (!) 113    Temp 98.3 F (36.8 C) (Oral)    Resp (!) 23    Ht 4' 9"  (1.448 m)    Wt 59.9 kg    LMP 02/18/2021 (Exact Date)    SpO2 97%    BMI 28.56 kg/m   Physical Exam Vitals and nursing note reviewed.  Constitutional:      General: [name] not in acute distress.    Appearance: [name] well-developed. [name] not ill-appearing, toxic-appearing or diaphoretic.  HENT:     Head: Normocephalic and atraumatic.  Eyes:     Conjunctiva/sclera: Conjunctivae normal.     Pupils: Pupils are equal, round, and reactive to light.  Cardiovascular:     Rate and Rhythm: Regular rhythm. Tachycardia present.     Heart sounds: Normal heart sounds. No murmur heard. Pulmonary:     Effort: Pulmonary effort is normal. Tachypnea present. No respiratory distress.     Breath sounds: Examination of the right-lower field reveals rales. Examination of the left-lower field reveals rales. Rales present. No decreased breath sounds, wheezing or rhonchi.  Chest:     Chest wall: Tenderness present.  Abdominal:     Palpations: Abdomen is soft.     Tenderness: There is no abdominal tenderness.  Musculoskeletal:        General: No swelling.     Cervical back: Neck supple.     Right lower leg: No tenderness. No edema.     Left lower leg: No tenderness. No edema.  Skin:    General: Skin is warm and dry.     Capillary Refill: Capillary refill takes less than 2 seconds.     Findings: No erythema.  Neurological:     General: No focal deficit present.     Mental Status: [name] alert.  Psychiatric:        Mood and Affect: Mood  normal.    ED Results / Procedures / Treatments   Labs (all labs ordered are listed, but only abnormal results are displayed) Labs Reviewed  BASIC METABOLIC PANEL - Abnormal; Notable for the following components:  Result Value   Sodium 123 (*)    Chloride 89 (*)    Glucose, Bld 500 (*)    BUN 27 (*)    Creatinine, Ser 1.66 (*)    GFR, Estimated 43 (*)    All other components within normal limits  CBC - Abnormal; Notable for the following components:   RDW 15.7 (*)    All other components within normal limits  HEPATIC FUNCTION PANEL - Abnormal; Notable for the following components:   AST 82 (*)    ALT 74 (*)    All other components within normal limits  D-DIMER, QUANTITATIVE - Abnormal; Notable for the following components:   D-Dimer, Quant 0.85 (*)    All other components within normal limits  RESP PANEL BY RT-PCR (FLU A&B, COVID) ARPGX2  PREGNANCY, URINE  BRAIN NATRIURETIC PEPTIDE  MAGNESIUM  TROPONIN I (HIGH SENSITIVITY)  TROPONIN I (HIGH SENSITIVITY)    EKG EKG Interpretation  Date/Time:  Tuesday March 09 2021 21:12:42 EST Ventricular Rate:  121 PR Interval:  120 QRS Duration: 92 QT Interval:  329 QTC Calculation: 467 R Axis:   3 Text Interpretation: Sinus tachycardia Minimal ST depression, lateral leads When compared to prior, faster rate. No STEMI Confirmed by Antony Blackbird [phone]) on 03/09/2021 9:13:59 PM  Radiology DG Chest 2 View  Result Date: 03/09/2021 CLINICAL DATA:  Chest pain EXAM: CHEST - 2 VIEW COMPARISON:  02/08/2021 FINDINGS: Heart and mediastinal contours are within normal limits. No focal opacities or effusions. No acute bony abnormality. IMPRESSION: No active cardiopulmonary disease. Electronically Signed   By: Rolm Baptise M.D.   On: 03/09/2021 21:32   CT Angio Chest PE W and/or Wo Contrast  Result Date: 03/09/2021 CLINICAL DATA:  Chest pain, CHF exacerbation, elevated D-dimer EXAM: CT ANGIOGRAPHY CHEST WITH CONTRAST TECHNIQUE:  Multidetector CT imaging of the chest was performed using the standard protocol during bolus administration of intravenous contrast. Multiplanar CT image reconstructions and MIPs were obtained to evaluate the vascular anatomy. CONTRAST:  35m OMNIPAQUE IOHEXOL 350 MG/ML SOLN COMPARISON:  Chest radiographs dated 03/09/2021 FINDINGS: Cardiovascular: Satisfactory opacification of the bilateral pulmonary arteries to the segmental level. No evidence of pulmonary embolism. Although not tailored for evaluation of the thoracic aorta, there is no evidence of thoracic aortic aneurysm or dissection. Mild atherosclerotic calcifications of the aortic arch. The heart is normal in size.  Small inferior pericardial effusion. Mediastinum/Nodes: No suspicious mediastinal lymphadenopathy. Visualized thyroid is unremarkable. Lungs/Pleura: Lungs are clear. No suspicious pulmonary nodules. No frank interstitial edema or focal consolidation. No pleural effusion or pneumothorax. Upper Abdomen: Visualized upper abdomen is notable for moderate hepatic steatosis. Musculoskeletal: Visualized osseous structures are within normal limits. Review of the MIP images confirms the above findings. IMPRESSION: No evidence of pulmonary embolism. Negative CT chest. Electronically Signed   By: SJulian HyM.D.   On: 03/09/2021 22:45    Procedures Procedures   Medications Ordered in ED Medications  nitroGLYCERIN (NITROSTAT) SL tablet 0.4 mg (0.4 mg Sublingual Given 03/09/21 2158)  morphine 4 MG/ML injection 4 mg (4 mg Intravenous Given 03/09/21 2245)  iohexol (OMNIPAQUE) 350 MG/ML injection 60 mL (60 mLs Intravenous Contrast Given 03/09/21 2232)  ondansetron (ZOFRAN) injection 4 mg (4 mg Intravenous Given 03/09/21 2358)  sodium chloride 0.9 % bolus 500 mL (500 mLs Intravenous New Bag/Given 03/10/21 0003)  insulin aspart (novoLOG) injection 20 Units (20 Units Intravenous Given 03/09/21 2356)    ED Course  I have reviewed the triage  vital signs and the  nursing notes.  Pertinent labs & imaging results that were available during my care of the patient were reviewed by me and considered in my medical decision making (see chart for details).    MDM Rules/Calculators/A&P                           Jennifer [name] is a 30 y.o. female with a past medical history significant for previous brain tumor status post surgery, hypertension, diabetes, hypertension, PCOS, pancreatitis, CHF, and previous prolonged QT syndrome who presents with concern for chest pain, shortness of breath, nausea, vomiting, fatigue, and possible fluid overload.  Patient reports that she thinks she is up 15 pounds over the last month but has been using all of her diuretics.  She says that her endocrinologist has been changing some of her glucose medications around and her glucoses have been more elevated.  She reports some cough and some chest discomfort that feels like a tightness.  She reports it is exertional and slightly pleuritic.  She denies any leg pain or leg swelling but she reports she does not hold onto fluid in her legs typically.  She denies any fevers or chills and denies any other GU symptoms.  On exam, chest is tender to palpation.  Abdomen is nontender.  Lungs had very faint crackles in the bases but no significant rhonchi.  No murmur.  Exam otherwise unremarkable.   EKG showed sinus tachycardia with no STEMI.  Blood pressure initially was elevated in the 190s.  Due to the chest pain elevated blood pressures we agreed to do a nitroglycerin to see if that helped.  Clinically we were concerned about possible hyperglycemia versus a fluid overload or cardiac cause of symptoms.  Patient D-dimer was elevated, CT PE study obtained which did not show any evidence of pulmonary embolism, fluid overload with edema, or any pneumonia.  A small inferior pericardial effusion was seen.  Patient reports her chest pain is now improving.  Otherwise her labs were  negative for COVID and flu, no leukocytosis or anemia, and pregnancy is negative.  Troponin is negative, BNP not elevated, magnesium normal, but metabolic panel is concerning for hyperglycemia at 500.  Creatinine is similar to prior.  Had a conversation with patient about management.  As she does not appear fluid overloaded now, will give some fluids to try to help her heart rate and hydration status as she is now vomiting in the room.  I suspect hyperglycemia is likely contributing to symptoms.  Patient reports that typically she would take 60 units of insulin when it was this high as she reports insensitivity to insulins.  As regard he is IV, will give 20 units and some fluids and reassess.  Patient would like to go home.  Care transferred to oncoming team while awaiting reassessment after fluids and insulin and if glucoses are improving and she is feeling better, anticipate discharge for close endocrinology follow-up and PCP follow-up.  If symptoms do not improve and her vomiting could continue to drive her blood pressure and heart rate up, patient may require further management.   Final Clinical Impression(s) / ED Diagnoses Final diagnoses:  Chest pain, unspecified type  Nausea and vomiting, unspecified vomiting type  Hyperglycemia   Clinical Impression: 1. Chest pain, unspecified type   2. Nausea and vomiting, unspecified vomiting type   3. Hyperglycemia     Disposition: Care transferred to oncoming team to await reassessment after rehydration and insulin.  This note was prepared with assistance of Systems analyst. Occasional wrong-word or sound-a-like substitutions may have occurred due to the inherent limitations of voice recognition software.     Dorris Pierre, Gwenyth Allegra, MD 03/10/21 [phone]

## 2021-03-10 ENCOUNTER — Observation Stay (HOSPITAL_BASED_OUTPATIENT_CLINIC_OR_DEPARTMENT_OTHER): Payer: BC Managed Care – PPO

## 2021-03-10 ENCOUNTER — Telehealth (HOSPITAL_COMMUNITY): Payer: Self-pay | Admitting: Cardiology

## 2021-03-10 DIAGNOSIS — R079 Chest pain, unspecified: Secondary | ICD-10-CM | POA: Diagnosis present

## 2021-03-10 DIAGNOSIS — I130 Hypertensive heart and chronic kidney disease with heart failure and stage 1 through stage 4 chronic kidney disease, or unspecified chronic kidney disease: Secondary | ICD-10-CM | POA: Diagnosis not present

## 2021-03-10 DIAGNOSIS — E1122 Type 2 diabetes mellitus with diabetic chronic kidney disease: Secondary | ICD-10-CM | POA: Diagnosis not present

## 2021-03-10 DIAGNOSIS — Z794 Long term (current) use of insulin: Secondary | ICD-10-CM | POA: Diagnosis not present

## 2021-03-10 DIAGNOSIS — Z79899 Other long term (current) drug therapy: Secondary | ICD-10-CM | POA: Diagnosis not present

## 2021-03-10 DIAGNOSIS — Z7982 Long term (current) use of aspirin: Secondary | ICD-10-CM | POA: Diagnosis not present

## 2021-03-10 DIAGNOSIS — N183 Chronic kidney disease, stage 3 unspecified: Secondary | ICD-10-CM | POA: Diagnosis not present

## 2021-03-10 DIAGNOSIS — I5022 Chronic systolic (congestive) heart failure: Secondary | ICD-10-CM | POA: Diagnosis present

## 2021-03-10 DIAGNOSIS — Z20822 Contact with and (suspected) exposure to covid-19: Secondary | ICD-10-CM | POA: Diagnosis not present

## 2021-03-10 LAB — ECHOCARDIOGRAM LIMITED
Height: 57 in
S' Lateral: 3.3 cm
Weight: 2112 oz

## 2021-03-10 LAB — GLUCOSE, CAPILLARY
Glucose-Capillary: 379 mg/dL — ABNORMAL HIGH (ref 70–99)
Glucose-Capillary: 324 mg/dL — ABNORMAL HIGH (ref 70–99)
Glucose-Capillary: 155 mg/dL — ABNORMAL HIGH (ref 70–99)

## 2021-03-10 LAB — TROPONIN I (HIGH SENSITIVITY)
Troponin I (High Sensitivity): 18 ng/L — ABNORMAL HIGH (ref <18)
Troponin I (High Sensitivity): 22 ng/L — ABNORMAL HIGH (ref <18)
Troponin I (High Sensitivity): 20 ng/L — ABNORMAL HIGH (ref <18)
Troponin I (High Sensitivity): 18 ng/L — ABNORMAL HIGH (ref <18)

## 2021-03-10 LAB — CBG MONITORING, ED
Glucose-Capillary: 361 mg/dL — ABNORMAL HIGH (ref 70–99)
Glucose-Capillary: 408 mg/dL — ABNORMAL HIGH (ref 70–99)

## 2021-03-10 MED ORDER — PANCRELIPASE (LIP-PROT-AMYL) 12000-38000 UNITS PO CPEP
36000.0000 [IU] | ORAL_CAPSULE | Freq: Three times a day (TID) | ORAL | Status: DC
  Administered 2021-03-10 – 2021-03-11 (×2): 36000 [IU] via ORAL
  Filled 2021-03-10 (×2): qty 3

## 2021-03-10 MED ORDER — ACETAMINOPHEN 325 MG PO TABS: 650.0000 mg | ORAL_TABLET | Freq: Four times a day (QID) | ORAL | Status: DC | PRN

## 2021-03-10 MED ORDER — FENOFIBRATE 160 MG PO TABS
160.0000 mg | ORAL_TABLET | Freq: Every day | ORAL | Status: DC
  Administered 2021-03-10 – 2021-03-11 (×2): 160 mg via ORAL
  Filled 2021-03-10 (×2): qty 1

## 2021-03-10 MED ORDER — LEVOTHYROXINE SODIUM 25 MCG PO TABS
125.0000 ug | ORAL_TABLET | Freq: Every day | ORAL | Status: DC
  Administered 2021-03-11: 125 ug via ORAL
  Filled 2021-03-10: qty 1

## 2021-03-10 MED ORDER — SODIUM CHLORIDE 0.9 % IV SOLN: INTRAVENOUS | Status: DC

## 2021-03-10 MED ORDER — ASPIRIN 81 MG PO CHEW
324.0000 mg | CHEWABLE_TABLET | Freq: Once | ORAL | Status: AC
  Administered 2021-03-10: 04:00:00 324 mg via ORAL
  Filled 2021-03-10: qty 4

## 2021-03-10 MED ORDER — INSULIN REGULAR HUMAN (CONC) 500 UNIT/ML ~~LOC~~ SOPN
80.0000 [IU] | PEN_INJECTOR | Freq: Every day | SUBCUTANEOUS | Status: DC
Start: 2021-03-10 — End: 2021-03-11
  Administered 2021-03-10: 22:00:00 80 [IU] via SUBCUTANEOUS

## 2021-03-10 MED ORDER — DULOXETINE HCL 30 MG PO CPEP
30.0000 mg | ORAL_CAPSULE | Freq: Every day | ORAL | Status: DC
  Administered 2021-03-10: 22:00:00 30 mg via ORAL
  Filled 2021-03-10: qty 1

## 2021-03-10 MED ORDER — MORPHINE SULFATE (PF) 4 MG/ML IV SOLN
4.0000 mg | Freq: Once | INTRAVENOUS | Status: AC
  Administered 2021-03-10: 06:00:00 4 mg via INTRAVENOUS
  Filled 2021-03-10: qty 1

## 2021-03-10 MED ORDER — SPIRONOLACTONE 12.5 MG HALF TABLET
12.5000 mg | ORAL_TABLET | Freq: Every day | ORAL | Status: DC
  Administered 2021-03-10 – 2021-03-11 (×2): 12.5 mg via ORAL
  Filled 2021-03-10 (×2): qty 1

## 2021-03-10 MED ORDER — PREGABALIN 100 MG PO CAPS
300.0000 mg | ORAL_CAPSULE | Freq: Every day | ORAL | Status: DC
  Administered 2021-03-10: 23:00:00 300 mg via ORAL
  Filled 2021-03-10: qty 3

## 2021-03-10 MED ORDER — DIPHENHYDRAMINE HCL 25 MG PO CAPS
25.0000 mg | ORAL_CAPSULE | Freq: Once | ORAL | Status: AC
  Administered 2021-03-10: 25 mg via ORAL
  Filled 2021-03-10: qty 1

## 2021-03-10 MED ORDER — INSULIN REGULAR HUMAN (CONC) 500 UNIT/ML ~~LOC~~ SOPN
180.0000 [IU] | PEN_INJECTOR | Freq: Three times a day (TID) | SUBCUTANEOUS | Status: DC
Start: 2021-03-10 — End: 2021-03-11
  Administered 2021-03-10 – 2021-03-11 (×2): 180 [IU] via SUBCUTANEOUS
  Filled 2021-03-10: qty 3

## 2021-03-10 MED ORDER — INSULIN ASPART 100 UNIT/ML IJ SOLN: 0.0000 [IU] | Freq: Every day | INTRAMUSCULAR | Status: DC

## 2021-03-10 MED ORDER — PERFLUTREN LIPID MICROSPHERE
1.0000 mL | INTRAVENOUS | Status: AC | PRN
Start: 2021-03-10 — End: 2021-03-10
  Administered 2021-03-10: 13:00:00 2 mL via INTRAVENOUS
  Filled 2021-03-10: qty 10

## 2021-03-10 MED ORDER — INSULIN ASPART 100 UNIT/ML IJ SOLN
0.0000 [IU] | Freq: Three times a day (TID) | INTRAMUSCULAR | Status: DC
  Administered 2021-03-10: 12:00:00 15 [IU] via SUBCUTANEOUS
  Administered 2021-03-10: 16:00:00 11 [IU] via SUBCUTANEOUS
  Administered 2021-03-11: 3 [IU] via SUBCUTANEOUS

## 2021-03-10 MED ORDER — OXYCODONE HCL 5 MG PO TABS
5.0000 mg | ORAL_TABLET | Freq: Four times a day (QID) | ORAL | Status: DC | PRN
  Administered 2021-03-10: 22:00:00 5 mg via ORAL
  Filled 2021-03-10: qty 1

## 2021-03-10 MED ORDER — NITROGLYCERIN 0.4 MG SL SUBL
0.4000 mg | SUBLINGUAL_TABLET | SUBLINGUAL | Status: AC | PRN
  Administered 2021-03-10 (×3): 0.4 mg via SUBLINGUAL

## 2021-03-10 MED ORDER — INSULIN GLARGINE-YFGN 100 UNIT/ML ~~LOC~~ SOLN
20.0000 [IU] | Freq: Every day | SUBCUTANEOUS | Status: DC
  Administered 2021-03-10: 14:00:00 20 [IU] via SUBCUTANEOUS
  Filled 2021-03-10: qty 0.2

## 2021-03-10 MED ORDER — METOCLOPRAMIDE HCL 5 MG/ML IJ SOLN
10.0000 mg | Freq: Once | INTRAMUSCULAR | Status: AC
  Administered 2021-03-10: 02:00:00 10 mg via INTRAVENOUS
  Filled 2021-03-10: qty 2

## 2021-03-10 MED ORDER — ASPIRIN EC 81 MG PO TBEC
81.0000 mg | DELAYED_RELEASE_TABLET | Freq: Every day | ORAL | Status: DC
  Administered 2021-03-10 – 2021-03-11 (×2): 81 mg via ORAL
  Filled 2021-03-10 (×2): qty 1

## 2021-03-10 MED ORDER — CARVEDILOL 3.125 MG PO TABS
3.1250 mg | ORAL_TABLET | Freq: Two times a day (BID) | ORAL | Status: DC
  Administered 2021-03-10 – 2021-03-11 (×3): 3.125 mg via ORAL
  Filled 2021-03-10 (×3): qty 1

## 2021-03-10 MED ORDER — HEPARIN SODIUM (PORCINE) 5000 UNIT/ML IJ SOLN
5000.0000 [IU] | Freq: Three times a day (TID) | INTRAMUSCULAR | Status: DC
  Administered 2021-03-10 – 2021-03-11 (×3): 5000 [IU] via SUBCUTANEOUS
  Filled 2021-03-10 (×3): qty 1

## 2021-03-10 MED ORDER — DIPHENHYDRAMINE HCL 50 MG/ML IJ SOLN
25.0000 mg | Freq: Once | INTRAMUSCULAR | Status: AC
  Administered 2021-03-10: 02:00:00 25 mg via INTRAVENOUS
  Filled 2021-03-10: qty 1

## 2021-03-10 MED ORDER — DIPHENHYDRAMINE HCL 50 MG/ML IJ SOLN
25.0000 mg | Freq: Once | INTRAMUSCULAR | Status: AC
  Administered 2021-03-10: 06:00:00 25 mg via INTRAVENOUS
  Filled 2021-03-10: qty 1

## 2021-03-10 MED ORDER — MAGNESIUM OXIDE -MG SUPPLEMENT 400 (240 MG) MG PO TABS
400.0000 mg | ORAL_TABLET | Freq: Every day | ORAL | Status: DC
  Administered 2021-03-10 – 2021-03-11 (×2): 400 mg via ORAL
  Filled 2021-03-10 (×3): qty 1

## 2021-03-10 NOTE — H&P (Signed)
History and Physical    Jennifer [name] WSF:[id number] DOB: 11/22/1990 DOA: 03/09/2021  PCP: Sue Lush, PA-C   Patient coming from: Home    Chief Complaint: Chest pain, shortness of breath  HPI: Jennifer [name] is a 30 y.o. female with medical history significant of nonischemic cardiomyopathy, brain tumor status post surgery, hypertension, diabetes type 2, PCOS, pancreatitis, chronic hyponatremia who presented to the emergency department at [facility] [place] with complaints of chest pain, shortness of breath, nausea, vomiting, fatigue.  She reported that she got 15 pounds were gone over last month and was taking diuretics.  Her sugars were consistently elevated at home.  She also complained of cough, some chest discomfort, chest tightness.  There was no report of fever, chills, dysuria, diarrhea or hematochezia. Patient seen and examined at the bedside this afternoon.  During my evaluation she was in sinus tachycardia but her blood pressure was stable.  She was sitting on the edge of the bed and looks comfortable. She was asking me to give IV Dilaudid for the chest pain and treatment to send her home if we are not giving Dilaudid.  I politely declined that we do not give IV Dilaudid right away,we need to investigate more on her chest pain  ED Course: EKG showed sinus tachycardia without any ischemic changes.  Blood pressure was elevated on presentation.  Patient was given nitroglycerin for chest pain.  D-dimer was elevated so CT PE protocol was obtained which did not show any PE ,pulmonary edema or pneumonia.  COVID/flu screen test was negative.  Troponins were mildly elevated, flat trend.  BNP not elevated.  Levels are severe hyperglycemia with glucose in the range of 500.  Patient was transferred to [facility] for further evaluation.  She was persistently tachycardic on presentation, hypertensive.  She was maintaining her saturation on room air.  Lab work showed sodium of 123, creatinine of  1.66, glucose of 500.  CT abdomen/pelvis done on 03/05/2021 did not show  any acute findings.  Chest x-ray done today did not show pneumonia  Review of Systems: As per HPI otherwise 10 point review of systems negative.    Past Medical History:  Diagnosis Date   Brain tumor ([place]) 03/29/1995   astrocytoma   CHF (congestive heart failure) ([place])    Cholesterosis    DM (diabetes mellitus) ([place]) 10/10/2018   Fatty liver    HTN (hypertension) 10/10/2018   Hypothyroidism 10/10/2018   Lipoprotein deficiency    Lung disease    longevity long term   Pancreatitis    Polycystic ovary syndrome     Past Surgical History:  Procedure Laterality Date   ABDOMINAL SURGERY     pt states during miscarriage got her intestine   BRAIN SURGERY     EYE MUSCLE SURGERY Right 03/28/2014   RIGHT/LEFT HEART CATH AND CORONARY ANGIOGRAPHY N/A 12/03/2020   Procedure: RIGHT/LEFT HEART CATH AND CORONARY ANGIOGRAPHY;  Surgeon: [name], MD;  Location: [facility];  Service: Cardiovascular;  Laterality: N/A;   VENTRICULOSTOMY  03/28/1997     reports that she has never smoked. She has never used smokeless tobacco. She reports that she does not drink alcohol and does not use drugs.  Allergies  Allergen Reactions   Ketamine Other (See Comments)    In vegetative state for 15 minutes per pt   Clarithromycin Other (See Comments)    Stomach pain    Fentanyl Nausea And Vomiting   Maitake Mushroom [[place]] Itching    Itchy  throat   Penicillin V Itching    Gi upset   Shellfish Allergy Swelling and Other (See Comments)    Throat swelling. Pt states contrast in CT is okay   Morphine Itching and Rash   Penicillins Rash    Has patient had a PCN reaction causing immediate rash, facial/tongue/throat swelling, SOB or lightheadedness with hypotension: Y Has patient had a PCN reaction causing severe rash involving mucus membranes or skin necrosis: Y Has patient had a PCN reaction that required hospitalization:  N Has patient had a PCN reaction occurring within the last 10 years: Y If all of the above answers are "NO", then may proceed with Cephalosporin use.    Prednisone Rash    Family History  Problem Relation Age of Onset   Diabetes Mother    Hypertension Mother    Hyperlipidemia Mother    Thyroid disease Mother    Hypertension Father    Diabetes Father    Pancreatic cancer Paternal Aunt    Pancreatic cancer Paternal Uncle    Colon cancer Neg Hx    Esophageal cancer Neg Hx    Stomach cancer Neg Hx      Prior to Admission medications   Medication Sig Start Date End Date Taking? Authorizing Provider  albuterol (VENTOLIN HFA) 108 (90 Base) MCG/ACT inhaler Inhale 2 puffs into the lungs every 6 (six) hours as needed for wheezing or shortness of breath. 12/31/18  Yes [provider]  aspirin EC 81 MG EC tablet Take 1 tablet (81 mg total) by mouth daily. Swallow whole. 12/07/20  Yes Nicole Kindred A, DO  carvedilol (COREG) 3.125 MG tablet Take 3.125 mg by mouth 2 (two) times daily. 02/03/21  Yes [provider]  cholecalciferol (VITAMIN D) 25 MCG tablet Take 1 tablet (1,000 Units total) by mouth daily. 12/07/20  Yes Nicole Kindred A, DO  DULoxetine (CYMBALTA) 30 MG capsule Take 30 mg by mouth at bedtime. 01/28/19 12/16/21 Yes [provider]  erythromycin ophthalmic ointment Place one application into the right eye at bedtime. 03/25/20  Yes [provider]  Evolocumab (REPATHA) 140 MG/ML SOSY Inject 140 mg into the skin See admin instructions. Inject 140 mg subcutaneously every other Sunday evening   Yes [provider]  fenofibrate 160 MG tablet Take 1 tablet (160 mg total) by mouth daily. 12/07/20  Yes Nicole Kindred A, DO  fluticasone furoate-vilanterol (BREO ELLIPTA) 100-25 MCG/INH AEPB Inhale 1 puff into the lungs daily as needed (shortness of breath). 01/21/19  Yes [provider]  insulin regular human CONCENTRATED (HUMULIN R) 500  UNIT/ML KwikPen Patient inject 170 u before breakfast and 140 u before lunch and dinner. 01/28/21  Yes [provider]  levothyroxine (SYNTHROID) 125 MCG tablet Take 1 tablet (125 mcg total) by mouth daily at 6 (six) AM. 12/07/20  Yes Ezekiel Slocumb, DO  lipase/protease/amylase (CREON) 36000 UNITS CPEP capsule Take 1 capsule (36,000 Units total) by mouth 3 (three) times daily with meals. Patient taking differently: Take 72,000 Units by mouth 3 (three) times daily with meals. Also take 1 capsule with snacks 12/06/20  Yes Nicole Kindred A, DO  magnesium oxide (MAG-OX) 400 MG tablet Take by mouth. 06/24/20  Yes [provider]  Multiple Vitamin (MULTIVITAMIN WITH MINERALS) TABS tablet Take 1 tablet by mouth daily. 12/07/20  Yes Nicole Kindred A, DO  nystatin-triamcinolone (MYCOLOG II) cream nystatin-triamcinolone 100,000 unit/g-0.1 % topical cream  APPLY CREAM TOPICALLY TO AFFECTED AREA ONCE DAILY   Yes [provider]  ondansetron (ZOFRAN) 4 MG tablet Take 4 mg by mouth every 8 (eight) hours as needed for nausea or vomiting. 12/19/18  Yes [provider]  pantoprazole (PROTONIX) 40 MG tablet Take 40 mg by mouth daily. 12/24/19  Yes [provider]  pregabalin (LYRICA) 100 MG capsule Take 200 mg by mouth in the morning. 10/09/20  Yes [provider]  pregabalin (LYRICA) 300 MG capsule Take 300 mg by mouth at bedtime.   Yes [provider]  prochlorperazine (COMPAZINE) 10 MG tablet Take 1 tablet (10 mg total) by mouth every 6 (six) hours as needed for nausea or vomiting. 01/18/21  Yes Molpus, John, MD  sacubitril-valsartan (ENTRESTO) 24-26 MG Take 1 tablet by mouth daily.   Yes [provider]  spironolactone (ALDACTONE) 25 MG tablet Patient takes 0.5 tablet daily.   Yes [provider]  torsemide (DEMADEX) 20 MG tablet Take 20 mg by mouth daily.   Yes [provider]  tretinoin (RETIN-A) 0.05 % cream Apply 1  application topically at bedtime. 01/17/21  Yes [provider]  nitroGLYCERIN (NITROSTAT) 0.4 MG SL tablet Place 1 tablet (0.4 mg total) under the tongue every 5 (five) minutes as needed for up to 25 days for chest pain. 08/01/19 01/06/22  [name], MD  oxyCODONE-acetaminophen (PERCOCET) 10-325 MG tablet Take 1 tablet by mouth every 6 (six) hours as needed for pain. Patient not taking: Reported on 03/10/2021 01/18/21   Shanon Rosser, MD    Physical Exam: Vitals:   03/10/21 0715 03/10/21 0730 03/10/21 0743 03/10/21 1030  BP: (!) 146/95 (!) 135/95    Pulse: (!) 107 (!) 112  (!) 110  Resp: 20 (!) 21  (!) 22  Temp:      TempSrc:      SpO2: 90% (!) 89% 95% 92%  Weight:      Height:        Constitutional: NAD, calm, comfortable Vitals:   03/10/21 0715 03/10/21 0730 03/10/21 0743 03/10/21 1030  BP: (!) 146/95 (!) 135/95    Pulse: (!) 107 (!) 112  (!) 110  Resp: 20 (!) 21  (!) 22  Temp:      TempSrc:      SpO2: 90% (!) 89% 95% 92%  Weight:      Height:       Eyes: PERRL, ptosis of right eye ENMT: Mucous membranes are moist.  Neck: normal, supple, no masses, no thyromegaly Respiratory: clear to auscultation bilaterally, no wheezing, no crackles. Normal respiratory effort. No accessory muscle use.  Cardiovascular: Regular rate and rhythm, no murmurs / rubs / gallops. No extremity edema.  Abdomen: no tenderness, no masses palpated. No hepatosplenomegaly. Bowel sounds positive.  Musculoskeletal: no clubbing / cyanosis. No joint deformity upper and lower extremities.  Skin: no rashes, lesions, ulcers. No induration Neurologic: CN 2-12 grossly intact.  Strength 5/5 in all 4.  Psychiatric: Normal judgment and insight. Alert and oriented x 3. Normal mood.   Foley Catheter:None  Labs on Admission: I have personally reviewed following labs and imaging studies  CBC: Recent Labs  Lab 03/09/21 2116  WBC 7.5  HGB 12.8  HCT 37.6  MCV 87.0  PLT 488   Basic Metabolic  Panel: Recent Labs  Lab 03/09/21 2116  NA 123*  K 5.1  CL 89*  CO2 22  GLUCOSE 500*  BUN 27*  CREATININE 1.66*  CALCIUM 9.9  MG 2.4   GFR: Estimated Creatinine Clearance: 37.2 mL/min (A) (by C-G formula based on SCr  of 1.66 mg/dL (H)). Liver Function Tests: Recent Labs  Lab 03/09/21 2116  AST 82*  ALT 74*  ALKPHOS 62  BILITOT 0.8  PROT 7.8  ALBUMIN 4.4   No results for input(s): LIPASE, AMYLASE in the last 168 hours. No results for input(s): AMMONIA in the last 168 hours. Coagulation Profile: No results for input(s): INR, PROTIME in the last 168 hours. Cardiac Enzymes: No results for input(s): CKTOTAL, CKMB, CKMBINDEX, TROPONINI in the last 168 hours. BNP (last 3 results) No results for input(s): PROBNP in the last 8760 hours. HbA1C: No results for input(s): HGBA1C in the last 72 hours. CBG: Recent Labs  Lab 03/10/21 0047 03/10/21 1025  GLUCAP 408* 361*   Lipid Profile: No results for input(s): CHOL, HDL, LDLCALC, TRIG, CHOLHDL, LDLDIRECT in the last 72 hours. Thyroid Function Tests: No results for input(s): TSH, T4TOTAL, FREET4, T3FREE, THYROIDAB in the last 72 hours. Anemia Panel: No results for input(s): VITAMINB12, FOLATE, FERRITIN, TIBC, IRON, RETICCTPCT in the last 72 hours. Urine analysis:    Component Value Date/Time   COLORURINE YELLOW 02/09/2021 0000   APPEARANCEUR CLEAR 02/09/2021 0000   LABSPEC 1.010 02/09/2021 0000   PHURINE 6.0 02/09/2021 0000   GLUCOSEU 150 (A) 02/09/2021 0000   HGBUR NEGATIVE 02/09/2021 0000   BILIRUBINUR NEGATIVE 02/09/2021 0000   KETONESUR NEGATIVE 02/09/2021 0000   PROTEINUR NEGATIVE 02/09/2021 0000   NITRITE NEGATIVE 02/09/2021 0000   LEUKOCYTESUR TRACE (A) 02/09/2021 0000    Radiological Exams on Admission: DG Chest 2 View  Result Date: 03/09/2021 CLINICAL DATA:  Chest pain EXAM: CHEST - 2 VIEW COMPARISON:  02/08/2021 FINDINGS: Heart and mediastinal contours are within normal limits. No focal opacities or  effusions. No acute bony abnormality. IMPRESSION: No active cardiopulmonary disease. Electronically Signed   By: Rolm Baptise M.D.   On: 03/09/2021 21:32   CT Angio Chest PE W and/or Wo Contrast  Result Date: 03/09/2021 CLINICAL DATA:  Chest pain, CHF exacerbation, elevated D-dimer EXAM: CT ANGIOGRAPHY CHEST WITH CONTRAST TECHNIQUE: Multidetector CT imaging of the chest was performed using the standard protocol during bolus administration of intravenous contrast. Multiplanar CT image reconstructions and MIPs were obtained to evaluate the vascular anatomy. CONTRAST:  12m OMNIPAQUE IOHEXOL 350 MG/ML SOLN COMPARISON:  Chest radiographs dated 03/09/2021 FINDINGS: Cardiovascular: Satisfactory opacification of the bilateral pulmonary arteries to the segmental level. No evidence of pulmonary embolism. Although not tailored for evaluation of the thoracic aorta, there is no evidence of thoracic aortic aneurysm or dissection. Mild atherosclerotic calcifications of the aortic arch. The heart is normal in size.  Small inferior pericardial effusion. Mediastinum/Nodes: No suspicious mediastinal lymphadenopathy. Visualized thyroid is unremarkable. Lungs/Pleura: Lungs are clear. No suspicious pulmonary nodules. No frank interstitial edema or focal consolidation. No pleural effusion or pneumothorax. Upper Abdomen: Visualized upper abdomen is notable for moderate hepatic steatosis. Musculoskeletal: Visualized osseous structures are within normal limits. Review of the MIP images confirms the above findings. IMPRESSION: No evidence of pulmonary embolism. Negative CT chest. Electronically Signed   By: SJulian HyM.D.   On: 03/09/2021 22:45     Assessment/Plan Principal Problem:   Chest pain  Chest pain: Likely atypical chest pain.  Complain of pressure-like chest pain.  Not associated with exertion.  Mild elevated troponin but flat trend.  No ischemic changes in the EKG. Will check echocardiogram. Continue pain  management and supportive care with nitroglycerin, Tylenol.  History of systolic congestive heart failure: Last echo had shown EF of 45 to 50%.  New echo  pending.  Continue IV fluids for now.  Takes diuretics at home, on hold.  Takes Entresto, spironolactone, torsemide at home which are on hold  Type 2 diabetes: Takes insulin at home.  Monitor blood sugars.  Diabetic coordinator  consulted.  Last A1c was 8.9.  She follows up with endocrinology at [facility]  CKD stage IIIa: Baseline creatinine ranged from 1.4-1.6.  Currently kidney function at baseline.  Hyponatremia: Has chronic hyponatremia.  On discharge last time on 01/27/2021 her sodium was 124.  Not significantly changed from last time.  She follows with outpatient [facility].Continue gentle ic fluids  Hypertension: Takes metoprolol, Entresto, Aldactone.  Hypertensive today. Continue as needed medications for severe hypertension  Hypothyroidism: Continue Synthyroid  History of chronic pancreatitis: On Creon  History of neuropathy: Takes Lyrica at home  History of hypertriglyceridemia: On fenofibrate  History of astrocytoma: S/p resection/chemotherapy and now in remission..  Has residual left-sided weakness.    Severity of Illness: The appropriate patient status for this patient is OBSERVATION.    DVT prophylaxis: Heparin [place] Code Status: Full Family Communication: Husband at bedside Consults called: None     Shelly Coss MD [facility]  03/10/2021, 11:26 AM

## 2021-03-10 NOTE — ED Notes (Signed)
Pt stated that she felt fine and was ok

## 2021-03-10 NOTE — ED Notes (Signed)
Report given to carelink 

## 2021-03-10 NOTE — ED Provider Notes (Signed)
Care assumed from Dr. Sherry Ruffing, patient with hyperglycemia, nausea, vomiting getting IV fluids and antiemetics.  Also had chest pain and dyspnea with negative work-up, pending second troponin.  Second troponin was drawn late, but has increased slightly to 18.  She continued to have nausea in spite of ondansetron and has been given a dose of metoclopramide with improved control of nausea.  Rise in troponin is borderline significant.  She will be held in the ED for a third troponin.  Repeat troponin has risen further, now up to 22.  This is likely demand ischemia, doubt ACS.  However, she is given aspirin.  Because of rising troponin, she will need to be admitted.  Case is discussed with Dr. Hal Hope of [facility], who agrees to admit the patient.  She will need to be kept in the emergency department here pending bed availability at [place] Performed by: Delora Fuel Total critical care time: 35 minutes Critical care time was exclusive of separately billable procedures and treating other patients. Critical care was necessary to treat or prevent imminent or life-threatening deterioration. Critical care was time spent personally by me on the following activities: development of treatment plan with patient and/or surrogate as well as nursing, discussions with consultants, evaluation of patient's response to treatment, examination of patient, obtaining history from patient or surrogate, ordering and performing treatments and interventions, ordering and review of laboratory studies, ordering and review of radiographic studies, pulse oximetry and re-evaluation of patient's condition.   Delora Fuel, MD 15/37/94 [phone]

## 2021-03-10 NOTE — ED Notes (Signed)
Gave pt. Nitro @ 5:23 pt states chest pain @7 /10, Gave Nitro @5 :27 Pt. States pain @ 6/10 scale. Gave pt. Nitro @5 :32 pt rates pain @6 /10.

## 2021-03-10 NOTE — ED Notes (Signed)
Water given to patient. Dr. Pearline Cables okay with water at this time.

## 2021-03-10 NOTE — ED Notes (Signed)
Pt has good pleth at 89-90%. Pt states that she fells fine and her breathing is ok. Pt not in distress and has calm demeanor

## 2021-03-10 NOTE — Progress Notes (Signed)
Inpatient Diabetes Program Recommendations  AACE/ADA: New Consensus Statement on Inpatient Glycemic Control (2015)  Target Ranges:  Prepandial:   less than 140 mg/dL      Peak postprandial:   less than 180 mg/dL (1-2 hours)      Critically ill patients:  140 - 180 mg/dL   Lab Results  Component Value Date   GLUCAP 408 (H) 03/10/2021   HGBA1C 8.9 (H) 01/25/2021    Review of Glycemic Control  Diabetes history: DM type 2 Endocrinologist at [facility] Outpatient Diabetes medications: Concentrated Humulin R U-500 170 units breakfast, 140 units lunch, 140 units supper Current orders for Inpatient glycemic control:  None in ED at [facility] waiting [facility] admission  A1c 8.9% on 10/31 Diabetes Coordinator spoke with pt at that time.  Inpatient Diabetes Program Recommendations:    -   Semglee 80 units bid -   Novolog 0-15 units tid  -   When eating Novolog 10 units tid meal coverage  Thanks,  Tama Headings RN, MSN, BC-ADM Inpatient Diabetes Coordinator Team Pager [phone] (8a-5p)

## 2021-03-10 NOTE — Telephone Encounter (Signed)
Patient left message on triage line reporting she was in the ER arguing with MD, would like a call back so someone preferable a physician can review her chart with the doctor   Returned call-no answer [facility] with instructions to call back if something further is still needed  Pt admitted as of 1636

## 2021-03-10 NOTE — Progress Notes (Signed)
°   03/10/21 1955  Assess: MEWS Score  Temp 98.3 F (36.8 C)  BP (!) 135/91  Pulse Rate (!) 111  ECG Heart Rate (!) 111  Resp 20  SpO2 96 %  O2 Device Room Air  Patient Activity (if Appropriate) In bed  Assess: MEWS Score  MEWS Temp 0  MEWS Systolic 0  MEWS Pulse 2  MEWS RR 0  MEWS LOC 0  MEWS Score 2  MEWS Score Color Yellow  Assess: if the MEWS score is Yellow or Red  Were vital signs taken at a resting state? Yes  Focused Assessment No change from prior assessment  MEWS guidelines implemented *See Row Information* Yes  Treat  MEWS Interventions Escalated (See documentation below)  Pain Scale 0-10  Pain Score 0  Patients Stated Pain Goal 0  Take Vital Signs  Increase Vital Sign Frequency  Yellow: Q 2hr X 2 then Q 4hr X 2, if remains yellow, continue Q 4hrs  Escalate  MEWS: Escalate Yellow: discuss with charge nurse/RN and consider discussing with provider and RRT  Notify: Charge Nurse/RN  Name of Charge Nurse/RN Notified Chanda Busing, RN  Date Charge Nurse/RN Notified 03/10/21  Time Charge Nurse/RN Notified 2015  Document  Patient Outcome Other (Comment) (pt remains stable)  Progress note created (see row info) Yes

## 2021-03-11 DIAGNOSIS — R079 Chest pain, unspecified: Secondary | ICD-10-CM | POA: Diagnosis not present

## 2021-03-11 LAB — CBC
HCT: 36.7 % (ref 36.0–46.0)
Hemoglobin: 12.8 g/dL (ref 12.0–15.0)
MCH: 30.7 pg (ref 26.0–34.0)
MCHC: 34.9 g/dL (ref 30.0–36.0)
MCV: 88 fL (ref 80.0–100.0)
Platelets: 201 10*3/uL (ref 150–400)
RBC: 4.17 MIL/uL (ref 3.87–5.11)
RDW: 15.6 % — ABNORMAL HIGH (ref 11.5–15.5)
WBC: 7.5 10*3/uL (ref 4.0–10.5)
nRBC: 0 % (ref 0.0–0.2)

## 2021-03-11 LAB — BASIC METABOLIC PANEL
Anion gap: 12 (ref 5–15)
BUN: 30 mg/dL — ABNORMAL HIGH (ref 6–20)
CO2: 19 mmol/L — ABNORMAL LOW (ref 22–32)
Calcium: 9.4 mg/dL (ref 8.9–10.3)
Chloride: 103 mmol/L (ref 98–111)
Creatinine, Ser: 1.52 mg/dL — ABNORMAL HIGH (ref 0.44–1.00)
GFR, Estimated: 47 mL/min — ABNORMAL LOW (ref >60)
Glucose, Bld: 70 mg/dL (ref 70–99)
Potassium: 3.8 mmol/L (ref 3.5–5.1)
Sodium: 134 mmol/L — ABNORMAL LOW (ref 135–145)

## 2021-03-11 LAB — GLUCOSE, CAPILLARY
Glucose-Capillary: 165 mg/dL — ABNORMAL HIGH (ref 70–99)
Glucose-Capillary: 271 mg/dL — ABNORMAL HIGH (ref 70–99)
Glucose-Capillary: 55 mg/dL — ABNORMAL LOW (ref 70–99)
Glucose-Capillary: 85 mg/dL (ref 70–99)

## 2021-03-11 MED ORDER — CARVEDILOL 6.25 MG PO TABS: 6.2500 mg | ORAL_TABLET | Freq: Two times a day (BID) | ORAL | 0 refills | Status: DC

## 2021-03-11 MED ORDER — CARVEDILOL 6.25 MG PO TABS: 6.2500 mg | ORAL_TABLET | Freq: Two times a day (BID) | ORAL | Status: DC

## 2021-03-11 MED ORDER — CARVEDILOL 3.125 MG PO TABS: 3.1250 mg | ORAL_TABLET | Freq: Once | ORAL | Status: DC

## 2021-03-11 NOTE — Plan of Care (Signed)
  Problem: Education: Goal: Knowledge of General Education information will improve Description: Including pain rating scale, medication(s)/side effects and non-pharmacologic comfort measures Outcome: Adequate for Discharge   

## 2021-03-11 NOTE — Progress Notes (Signed)
Inpatient Diabetes Program Recommendations  AACE/ADA: New Consensus Statement on Inpatient Glycemic Control (2015)  Target Ranges:  Prepandial:   less than 140 mg/dL      Peak postprandial:   less than 180 mg/dL (1-2 hours)      Critically ill patients:  140 - 180 mg/dL   Lab Results  Component Value Date   GLUCAP 271 (H) 03/11/2021   HGBA1C 8.9 (H) 01/25/2021    Review of Glycemic Control  Diabetes history: type 2 Outpatient Diabetes medications: U-500 insulin 170 units at breakfast, 140 units at lunch, 140 units at supper, 80 units at HS Current orders for Inpatient glycemic control: U-500 insulin 180 units TID, Novolog 0-15 units correction scale TID  Inpatient Diabetes Program Recommendations:   Received diabetes coordinator consult today. Noted that patient had low CBG of 55 mg at 0104 this am. Noted that patient had a total of 260 units yesterday of U-500 insulin that probably caused the low CBG.   Patient has been discharged this am. Spoke with staff RN about patient, as she waiting for ride at this time. Recommend patient go back on home dose of U-500 at discharge.  Harvel Ricks RN BSN CDE Diabetes Coordinator Pager: [phone]  8am-5pm

## 2021-03-11 NOTE — Consult Note (Signed)
CARDIOLOGY CONSULT NOTE  Patient ID: Jennifer [name] MRN: [id number] DOB/AGE: 12-22-1990 30 y.o.  Admit date: 03/09/2021 Referring Physician  Shelly Coss, MD Primary Physician:  Elinor Parkinson Reason for Consultation  Chest pain and abnormal echocardiogram  Patient ID: Jennifer [name], female    DOB: [birth year], 30 y.o.   MRN: [id number]  Chief Complaint  Patient presents with   Congestive Heart Failure   Chest Pain   HPI:    Jennifer [name]  is a 30 y.o. Caucasian female  with hypertension, type 1 diabetes with peripheral neuropathy,  astrocytoma s/p surgery in 1997 and chemotherapy with residual right ptosis and left hemiparesis , chronic pancreatitis, non ischemic cardiomyopathy with chronic systolic heart failure EF 35-40% dating back to 2019, hypertension, and hypertriglycerdemia (has Lipoprotein lipase deficiency). Patient is a Special Ed. Teacher.   Her last hospitalization was 11/22/2020 with abdominal pain, triglycerides in 4000 range, at that time echocardiogram revealed ejection fraction of 20 to 25% and cardiac MR I revealed severe LV systolic dysfunction, normal RV size and systolic function, moderate pericardial effusion and mid wall LGE basal to mid anterior and anterolateral wall suggestive of either myocarditis or sarcoidosis.  She had been doing well and now being followed by [facility] cardiology.  Admitted to the hospital with abdominal discomfort, chest pain and dyspnea.  I was consulted to see the patient in view of abnormal echocardiogram.  Since admission yesterday, patient today states that she is feeling much better, she thinks it is her pancreatitis acting up.  She has not had any further chest pain, she has not had any dyspnea.  Denies leg edema, PND or orthopnea.  Denies hemoptysis.  Past Medical History:  Diagnosis Date   Brain tumor ([place]) 03/29/1995   astrocytoma   CHF (congestive heart failure) ([place])    Cholesterosis    DM (diabetes  mellitus) ([place]) 10/10/2018   Fatty liver    HTN (hypertension) 10/10/2018   Hypothyroidism 10/10/2018   Lipoprotein deficiency    Lung disease    longevity long term   Pancreatitis    Polycystic ovary syndrome    Past Surgical History:  Procedure Laterality Date   ABDOMINAL SURGERY     pt states during miscarriage got her intestine   BRAIN SURGERY     EYE MUSCLE SURGERY Right 03/28/2014   RIGHT/LEFT HEART CATH AND CORONARY ANGIOGRAPHY N/A 12/03/2020   Procedure: RIGHT/LEFT HEART CATH AND CORONARY ANGIOGRAPHY;  Surgeon: [name], MD;  Location: [facility];  Service: Cardiovascular;  Laterality: N/A;   VENTRICULOSTOMY  03/28/1997   Social History   Tobacco Use   Smoking status: Never   Smokeless tobacco: Never  Substance Use Topics   Alcohol use: Never    Family History  Problem Relation Age of Onset   Diabetes Mother    Hypertension Mother    Hyperlipidemia Mother    Thyroid disease Mother    Hypertension Father    Diabetes Father    Pancreatic cancer Paternal Aunt    Pancreatic cancer Paternal Uncle    Colon cancer Neg Hx    Esophageal cancer Neg Hx    Stomach cancer Neg Hx     Marital Status: Married  ROS  Review of Systems  Cardiovascular:  Positive for chest pain and dyspnea on exertion. Negative for leg swelling.  Gastrointestinal:  Positive for bloating and abdominal pain. Negative for melena.  All other systems reviewed and are negative. Objective   Vitals with BMI 03/11/2021 03/11/2021 03/11/2021  Height - - -  Weight - - -  BMI - - -  Systolic 932 671 245  Diastolic 97 90 91  Pulse 809 107 -    Blood pressure (!) 170/97, pulse (!) 117, temperature 98.4 F (36.9 C), temperature source Oral, resp. rate 19, height 4' 9"  (1.448 m), weight 59.9 kg, last menstrual period 02/18/2021, SpO2 96 %. Body mass index is 28.56 kg/m.   Physical Exam Constitutional:      Appearance: [name] normal weight.  Neck:     Vascular: No carotid bruit or JVD.   Cardiovascular:     Rate and Rhythm: Normal rate and regular rhythm.     Pulses: Intact distal pulses.     Heart sounds: Normal heart sounds. No murmur heard.   No gallop.  Pulmonary:     Effort: Pulmonary effort is normal.     Breath sounds: Normal breath sounds.  Abdominal:     General: Bowel sounds are normal.     Palpations: Abdomen is soft.  Musculoskeletal:        General: No swelling.  Neurological:     Mental Status: [name] alert.  Psychiatric:        Behavior: Behavior is cooperative.   Laboratory examination:   Recent Labs    02/08/21 2030 03/09/21 2116 03/11/21 0104  NA 124* 123* 134*  K 5.1 5.1 3.8  CL 89* 89* 103  CO2 18* 22 19*  GLUCOSE 326* 500* 70  BUN 46* 27* 30*  CREATININE 1.64* 1.66* 1.52*  CALCIUM 8.9 9.9 9.4  GFRNONAA 43* 43* 47*   estimated creatinine clearance is 40.6 mL/min (A) (by C-G formula based on SCr of 1.52 mg/dL (H)).  CMP Latest Ref Rng & Units 03/11/2021 03/09/2021 02/08/2021  Glucose 70 - 99 mg/dL 70 500(H) 326(H)  BUN 6 - 20 mg/dL 30(H) 27(H) 46(H)  Creatinine 0.44 - 1.00 mg/dL 1.52(H) 1.66(H) 1.64(H)  Sodium 135 - 145 mmol/L 134(L) 123(L) 124(L)  Potassium 3.5 - 5.1 mmol/L 3.8 5.1 5.1  Chloride 98 - 111 mmol/L 103 89(L) 89(L)  CO2 22 - 32 mmol/L 19(L) 22 18(L)  Calcium 8.9 - 10.3 mg/dL 9.4 9.9 8.9  Total Protein 6.5 - 8.1 g/dL - 7.8 6.6  Total Bilirubin 0.3 - 1.2 mg/dL - 0.8 1.6(H)  Alkaline Phos 38 - 126 U/L - 62 80  AST 15 - 41 U/L - 82(H) 125(H)  ALT 0 - 44 U/L - 74(H) 92(H)   CBC Latest Ref Rng & Units 03/11/2021 03/09/2021 02/08/2021  WBC 4.0 - 10.5 K/uL 7.5 7.5 7.6  Hemoglobin 12.0 - 15.0 g/dL 12.8 12.8 11.9(L)  Hematocrit 36.0 - 46.0 % 36.7 37.6 33.1(L)  Platelets 150 - 400 K/uL 201 201 260   Lipid Panel Recent Labs    12/03/20 2141 12/04/20 0505 12/04/20 0925 12/05/20 0500 12/06/20 0500  CHOL 435*  --  412* 344*  --   TRIG 849*   < > 670* 1,461* 1,034*  LDLCALC UNABLE TO CALCULATE IF TRIGLYCERIDE OVER  400 mg/dL  --  UNABLE TO CALCULATE IF TRIGLYCERIDE OVER 400 mg/dL UNABLE TO CALCULATE IF TRIGLYCERIDE OVER 400 mg/dL  --   VLDL UNABLE TO CALCULATE IF TRIGLYCERIDE OVER 400 mg/dL  --  UNABLE TO CALCULATE IF TRIGLYCERIDE OVER 400 mg/dL UNABLE TO CALCULATE IF TRIGLYCERIDE OVER 400 mg/dL  --   HDL 27*  --  25* NOT REPORTED DUE TO HIGH TRIGLYCERIDES  --   CHOLHDL 16.1  --  16.5 NOT REPORTED  DUE TO HIGH TRIGLYCERIDES  --   LDLDIRECT 234.3*  --  213.1* 127.2*  --    < > = values in this interval not displayed.    HEMOGLOBIN A1C Lab Results  Component Value Date   HGBA1C 8.9 (H) 01/25/2021   MPG 208.73 01/25/2021   TSH Recent Labs    11/28/20 1511  TSH 0.098*   BNP (last 3 results) Recent Labs    01/24/21 1647 02/08/21 2030 03/09/21 2116  BNP 10.3 33.6 37.9    Cardiac Panel (last 3 results) Recent Labs    03/10/21 0259 03/10/21 1744 03/10/21 1921  TROPONINIHS 22* 20* 18*     Medications and allergies   Allergies  Allergen Reactions   Ketamine Other (See Comments)    In vegetative state for 15 minutes per pt   Clarithromycin Other (See Comments)    Stomach pain    Fentanyl Nausea And Vomiting   Maitake Mushroom [Maitake] Itching    Itchy throat   Penicillin V Itching    Gi upset   Shellfish Allergy Swelling and Other (See Comments)    Throat swelling. Pt states contrast in CT is okay   Morphine Itching and Rash   Penicillins Rash    Has patient had a PCN reaction causing immediate rash, facial/tongue/throat swelling, SOB or lightheadedness with hypotension: Y Has patient had a PCN reaction causing severe rash involving mucus membranes or skin necrosis: Y Has patient had a PCN reaction that required hospitalization: N Has patient had a PCN reaction occurring within the last 10 years: Y If all of the above answers are "NO", then may proceed with Cephalosporin use.    Prednisone Rash     Current Meds  Medication Sig   albuterol (VENTOLIN HFA) 108 (90 Base)  MCG/ACT inhaler Inhale 2 puffs into the lungs every 6 (six) hours as needed for wheezing or shortness of breath.   aspirin EC 81 MG EC tablet Take 1 tablet (81 mg total) by mouth daily. Swallow whole.   carvedilol (COREG) 3.125 MG tablet Take 3.125 mg by mouth 2 (two) times daily.   cholecalciferol (VITAMIN D) 25 MCG tablet Take 1 tablet (1,000 Units total) by mouth daily.   DULoxetine (CYMBALTA) 30 MG capsule Take 30 mg by mouth at bedtime.   erythromycin ophthalmic ointment Place one application into the right eye at bedtime.   Evolocumab (REPATHA) 140 MG/ML SOSY Inject 140 mg into the skin See admin instructions. Inject 140 mg subcutaneously every other Sunday evening   fenofibrate 160 MG tablet Take 1 tablet (160 mg total) by mouth daily.   fluticasone furoate-vilanterol (BREO ELLIPTA) 100-25 MCG/INH AEPB Inhale 1 puff into the lungs daily as needed (shortness of breath).   insulin regular human CONCENTRATED (HUMULIN R) 500 UNIT/ML KwikPen Patient inject 170 u before breakfast and 140 u before lunch and dinner.   levothyroxine (SYNTHROID) 125 MCG tablet Take 1 tablet (125 mcg total) by mouth daily at 6 (six) AM.   lipase/protease/amylase (CREON) 36000 UNITS CPEP capsule Take 1 capsule (36,000 Units total) by mouth 3 (three) times daily with meals. (Patient taking differently: Take 72,000 Units by mouth 3 (three) times daily with meals. Also take 1 capsule with snacks)   magnesium oxide (MAG-OX) 400 MG tablet Take by mouth.   Multiple Vitamin (MULTIVITAMIN WITH MINERALS) TABS tablet Take 1 tablet by mouth daily.   nystatin-triamcinolone (MYCOLOG II) cream nystatin-triamcinolone 100,000 unit/g-0.1 % topical cream  APPLY CREAM TOPICALLY TO AFFECTED AREA ONCE DAILY  ondansetron (ZOFRAN) 4 MG tablet Take 4 mg by mouth every 8 (eight) hours as needed for nausea or vomiting.   pantoprazole (PROTONIX) 40 MG tablet Take 40 mg by mouth daily.   pregabalin (LYRICA) 100 MG capsule Take 200 mg by mouth in  the morning.   pregabalin (LYRICA) 300 MG capsule Take 300 mg by mouth at bedtime.   prochlorperazine (COMPAZINE) 10 MG tablet Take 1 tablet (10 mg total) by mouth every 6 (six) hours as needed for nausea or vomiting.   sacubitril-valsartan (ENTRESTO) 24-26 MG Take 1 tablet by mouth daily.   spironolactone (ALDACTONE) 25 MG tablet Patient takes 0.5 tablet daily.   torsemide (DEMADEX) 20 MG tablet Take 20 mg by mouth daily.   tretinoin (RETIN-A) 0.05 % cream Apply 1 application topically at bedtime.    Scheduled Meds:  aspirin EC  81 mg Oral Daily   carvedilol  3.125 mg Oral BID   DULoxetine  30 mg Oral QHS   fenofibrate  160 mg Oral Daily   heparin  5,000 Units Subcutaneous Q8H   insulin aspart  0-15 Units Subcutaneous TID WC   insulin aspart  0-5 Units Subcutaneous QHS   insulin regular human CONCENTRATED  180 Units Subcutaneous TID WC   insulin regular human CONCENTRATED  80 Units Subcutaneous QHS   levothyroxine  125 mcg Oral Q0600   lipase/protease/amylase  36,000 Units Oral TID WC   magnesium oxide  400 mg Oral Daily   pregabalin  300 mg Oral QHS   spironolactone  12.5 mg Oral Daily   Continuous Infusions:  sodium chloride 125 mL/hr at 03/10/21 1651   PRN Meds:.acetaminophen, nitroGLYCERIN, oxyCODONE   I/O last 3 completed shifts: In: 1505.6 [I.V.:1505.6] Out: -  No intake/output data recorded.    Radiology:  DG Chest 2 View Result Date: 03/09/2021 CLINICAL DATA:  Chest pain EXAM: CHEST - 2 VIEW COMPARISON:  02/08/2021 FINDINGS: Heart and mediastinal contours are within normal limits. No focal opacities or effusions. No acute bony abnormality. IMPRESSION: No active cardiopulmonary disease  CT angiogram chest for PE protocol 03/09/2021: Small inferior pericardial effusion.  Thoracic aorta appears normal without aneurysm. Lungs are clear, no pulmonary embolism.  Cardiac Studies:   Cardiac MRI 12/01/2020: 1. The left ventricle is normal in size and has moderately  decreased function EF 42% There is mid to basal inferoseptal hypokinesis. The right ventricle is normal in size and has mildly decreased function.   2. There is evidence of subepicardial enhancement of the mid inferoseptal wall segment, which is compatible with myocarditis. There is also a very small focal subendocardial enhancement in the mid inferoseptum which is compatible with infarction.   3. There is evidence of myocardial edema of the mid epicardial inferoseptum which is compatible with acute myocarditis.   4. Small circumferential pericardial effusion  Right and left heart catheterization 12/03/2020: Normal right heart catheterization pressures.  RA 4/1, mean 1; RV 23/4, EDP 6; PA 22/8, mean 11 and PA saturation 76%.  PW 9/7, mean 7 mmHg. CO 5.6, CI 3.88.  QP/QS 1.00. LV: 136/9, EDP 17 mmHg.  Ao 131/86, mean 105 mmHg.  No pressure gradient across the aortic valve. RCA: A very small but dominant RCA which is diffusely diseased around 80%.  Mid segment 99% occlusion.  Conus branch collateralizes distal RCA. LM: Large, smooth and normal. LAD: Large vessel, minimal diffuse disease.  Small to moderate-sized D1 and D2. CX: Large vessel.  Minimal diffuse disease.  Gives origin to large OM1 and  OM 2.   Impression: Nonischemic cardiomyopathy, single vessel occlusion does not explain her markedly reduced LVEF.  Medical therapy for CAD and nonischemic cardiomyopathy.  I have started her on carvedilol 3.125 mg twice daily.  Echocardiogram 03/10/2021: 1. Left ventricular ejection fraction, by estimation, is 45%. Left ventricular ejection fraction by 3D volume is 46 %. The left ventricle has mildly decreased function. The left ventricle demonstrates regional wall motion abnormalities (basal inferior WMAs).  2. Right ventricular systolic function is normal. The right ventricular size is normal.  3. The mitral valve is normal in structure. No evidence of mitral valve regurgitation.  4. The aortic  valve is tricuspid. Aortic valve regurgitation is trivial.  EKG:  EKG 03/09/2021: Sinus tachycardia at the rate of 121 bpm, poor R wave progression, probably normal variant.  No evidence of ischemia, normal QT interval.  Assessment   1.  Musculoskeletal chest pain and chest pain related to abdominal discomfort 2.  Dyspnea on exertion, nonspecific, no clinical evidence of heart failure. 3.  CAD of native vessel involving the right coronary artery, chronic CTO and small vessel diffusely diseased.  No significant disease of the left system. 4.  Primary hypertension  Recommendations:   Jennifer [name] is a 30 y.o. Caucasian female  with hypertension, type 1 diabetes with peripheral neuropathy,  astrocytoma s/p surgery in 1997 and chemotherapy with residual right ptosis and left hemiparesis , chronic pancreatitis, non ischemic cardiomyopathy with chronic systolic heart failure EF 35-40% dating back to 2019, hypertension, and hypertriglycerdemia (has Lipoprotein lipase deficiency).   I have reviewed the results of the echocardiogram which is wall motion abnormality, she has had a recent cardiac catheterization that does not reveal any significant disease in the LAD and the serum troponins are flat.  Do not suspect ACS.  No change in the EKG. the inferior wall hypokinesis is probably due to prior known occluded diffusely diseased RCA.  Her LVEF has improved back to 45 to 50% baseline, no clinical evidence of heart failure, she has had complete resolution of chest pain and also dyspnea since admission to the hospital and would like to return home.  She has noticed her blood pressure to be elevated.  We will increase her carvedilol to 6.25 mg p.o. twice daily.  Hopefully this will also improve her heart rate control as well.  I will be happy to see her back in the office if she chooses to come back to [place] but appears that she is following up with [facility] cardiology.   [name], MD, [facility] 03/11/2021,  10:32 AM Office: [phone]

## 2021-03-11 NOTE — Progress Notes (Signed)
Discharge instructions (including medications) discussed with and copy provided to patient/caregiver 

## 2021-03-11 NOTE — Discharge Summary (Addendum)
Physician Discharge Summary  Jennifer [name] HYI:[id number] DOB: Aug 28, 1990 DOA: 03/09/2021  PCP: Sue Lush, PA-C  Admit date: 03/09/2021 Discharge date: 03/11/2021  Admitted From: Home Disposition:  Home  Discharge Condition:Stable CODE STATUS:FULL Diet recommendation: Heart Healthy   Brief/Interim Summary:  [name] is a 30 y.o. female with medical history significant of nonischemic cardiomyopathy, brain tumor status post surgery, hypertension, diabetes type 2, PCOS, pancreatitis, chronic hyponatremia who presented to the emergency department at [name] with complaints of chest pain, shortness of breath, nausea, vomiting, fatigue.  On presentation her sodium was also low, she was found to be mildly dehydrated so started on IV fluids.  Chest pain has resolved today.  Echocardiogram showed EF of 40 to 45% same as before, some wall motion abnormality.  We discussed the case with cardiology, Dr. Einar Gip, who does not recommend any new work-up.  [name] medically stable for discharge to home today.  Following problems were addressed during her hospitalization:  Chest pain: Likely atypical chest pain.  Complained of pressure-like chest pain. Not associated with exertion.  Mild elevated troponin but flat trend.  No ischemic changes in the EKG. Chest pain has resolved today.  Echocardiogram showed EF of 40 to 45% same as before, some wall motion abnormality.  We discussed the case with cardiology, Dr. Einar Gip, who does not recommend any new work-up.   History of systolic congestive heart failure: Last echo had shown EF of 45 to 50%.  New echo as above.  Takes Entresto, spironolactone, torsemide at home which we will continue   Type 2 diabetes: Takes humulin insulin at home.   Last A1c was 8.9.  She follows up with endocrinology at [facility].  Continue same insulin regimen   CKD stage IIIa: Baseline creatinine ranged from 1.4-1.6.  Currently kidney function at baseline.    Hyponatremia: Has chronic hyponatremia.  On discharge last time on 01/27/2021 her sodium was 124.  Not significantly changed from last time on this admission.  Sodium level improved to the range of 130s with IV fluids.   Hypertension: Takes metoprolol, Entresto, Aldactone.  Remains hypertensive and has chronic sinus tachycardia.  Dose of carvedilol increased   Hypothyroidism: Continue Synthyroid   History of chronic pancreatitis: On Creon   History of neuropathy: Takes Lyrica at home   History of hypertriglyceridemia: On fenofibrate   History of astrocytoma: S/p resection/chemotherapy and now in remission..  Has residual left-sided weakness.  Discharge Diagnoses:  Principal Problem:   Chest pain    Discharge Instructions  Discharge Instructions     Diet - low sodium heart healthy   Complete by: As directed    Discharge instructions   Complete by: As directed    1)Please take prescribed medication as instructed 2)Follow up with your PCP in a week   Increase activity slowly   Complete by: As directed       Allergies as of 03/11/2021       Reactions   Ketamine Other (See Comments)   In vegetative state for 15 minutes per pt   Clarithromycin Other (See Comments)   Stomach pain   Fentanyl Nausea And Vomiting   Maitake Mushroom [maitake] Itching   Itchy throat   Penicillin V Itching   Gi upset   Shellfish Allergy Swelling, Other (See Comments)   Throat swelling. Pt states contrast in CT is okay   Morphine Itching, Rash   Penicillins Rash   Has patient had a PCN reaction causing immediate rash, facial/tongue/throat  swelling, SOB or lightheadedness with hypotension: Y Has patient had a PCN reaction causing severe rash involving mucus membranes or skin necrosis: Y Has patient had a PCN reaction that required hospitalization: N Has patient had a PCN reaction occurring within the last 10 years: Y If all of the above answers are "NO", then may proceed with Cephalosporin  use.   Prednisone Rash        Medication List     TAKE these medications    albuterol 108 (90 Base) MCG/ACT inhaler Commonly known as: VENTOLIN HFA Inhale 2 puffs into the lungs every 6 (six) hours as needed for wheezing or shortness of breath.   aspirin 81 MG EC tablet Take 1 tablet (81 mg total) by mouth daily. Swallow whole.   Breo Ellipta 100-25 MCG/ACT Aepb Generic drug: fluticasone furoate-vilanterol Inhale 1 puff into the lungs daily as needed (shortness of breath).   carvedilol 6.25 MG tablet Commonly known as: COREG Take 1 tablet (6.25 mg total) by mouth 2 (two) times daily. What changed:  medication strength how much to take   DULoxetine 30 MG capsule Commonly known as: CYMBALTA Take 30 mg by mouth at bedtime.   Entresto 24-26 MG Generic drug: sacubitril-valsartan Take 1 tablet by mouth daily.   erythromycin ophthalmic ointment Place one application into the right eye at bedtime.   fenofibrate 160 MG tablet Take 1 tablet (160 mg total) by mouth daily.   insulin regular human CONCENTRATED 500 UNIT/ML KwikPen Commonly known as: HUMULIN R Patient inject 170 u before breakfast and 140 u before lunch and dinner.   levothyroxine 125 MCG tablet Commonly known as: SYNTHROID Take 1 tablet (125 mcg total) by mouth daily at 6 (six) AM.   lipase/protease/amylase 36000 UNITS Cpep capsule Commonly known as: CREON Take 1 capsule (36,000 Units total) by mouth 3 (three) times daily with meals. What changed:  how much to take additional instructions   magnesium oxide 400 MG tablet Commonly known as: MAG-OX Take by mouth.   multivitamin with minerals Tabs tablet Take 1 tablet by mouth daily.   nitroGLYCERIN 0.4 MG SL tablet Commonly known as: NITROSTAT Place 1 tablet (0.4 mg total) under the tongue every 5 (five) minutes as needed for up to 25 days for chest pain.   nystatin-triamcinolone cream Commonly known as: MYCOLOG II nystatin-triamcinolone 100,000  unit/g-0.1 % topical cream  APPLY CREAM TOPICALLY TO AFFECTED AREA ONCE DAILY   ondansetron 4 MG tablet Commonly known as: ZOFRAN Take 4 mg by mouth every 8 (eight) hours as needed for nausea or vomiting.   oxyCODONE-acetaminophen 10-325 MG tablet Commonly known as: Percocet Take 1 tablet by mouth every 6 (six) hours as needed for pain.   pantoprazole 40 MG tablet Commonly known as: PROTONIX Take 40 mg by mouth daily.   pregabalin 300 MG capsule Commonly known as: LYRICA Take 300 mg by mouth at bedtime.   pregabalin 100 MG capsule Commonly known as: LYRICA Take 200 mg by mouth in the morning.   prochlorperazine 10 MG tablet Commonly known as: COMPAZINE Take 1 tablet (10 mg total) by mouth every 6 (six) hours as needed for nausea or vomiting.   Repatha 140 MG/ML Sosy Generic drug: Evolocumab Inject 140 mg into the skin See admin instructions. Inject 140 mg subcutaneously every other Sunday evening   spironolactone 25 MG tablet Commonly known as: ALDACTONE Patient takes 0.5 tablet daily.   torsemide 20 MG tablet Commonly known as: DEMADEX Take 20 mg by mouth daily.  tretinoin 0.05 % cream Commonly known as: RETIN-A Apply 1 application topically at bedtime.   Vitamin D3 25 MCG tablet Commonly known as: Vitamin D Take 1 tablet (1,000 Units total) by mouth daily.        Follow-up Information     Sue Lush, PA-C. Schedule an appointment as soon as possible for a visit in 1 week(s).   Specialty: Physician Assistant Contact information: [place] [address] [phone]         Jacelyn Pi, MD .   Specialty: Endocrinology Contact information: [place] [address] [phone]                Allergies  Allergen Reactions   Ketamine Other (See Comments)    In vegetative state for 15 minutes per pt   Clarithromycin Other (See Comments)    Stomach pain    Fentanyl Nausea  And Vomiting   Maitake Mushroom [Maitake] Itching    Itchy throat   Penicillin V Itching    Gi upset   Shellfish Allergy Swelling and Other (See Comments)    Throat swelling. Pt states contrast in CT is okay   Morphine Itching and Rash   Penicillins Rash    Has patient had a PCN reaction causing immediate rash, facial/tongue/throat swelling, SOB or lightheadedness with hypotension: Y Has patient had a PCN reaction causing severe rash involving mucus membranes or skin necrosis: Y Has patient had a PCN reaction that required hospitalization: N Has patient had a PCN reaction occurring within the last 10 years: Y If all of the above answers are "NO", then may proceed with Cephalosporin use.    Prednisone Rash    Consultations: Cardiology   Procedures/Studies: DG Chest 2 View  Result Date: 03/09/2021 CLINICAL DATA:  Chest pain EXAM: CHEST - 2 VIEW COMPARISON:  02/08/2021 FINDINGS: Heart and mediastinal contours are within normal limits. No focal opacities or effusions. No acute bony abnormality. IMPRESSION: No active cardiopulmonary disease. Electronically Signed   By: Rolm Baptise M.D.   On: 03/09/2021 21:32   CT Angio Chest PE W and/or Wo Contrast  Result Date: 03/09/2021 CLINICAL DATA:  Chest pain, CHF exacerbation, elevated D-dimer EXAM: CT ANGIOGRAPHY CHEST WITH CONTRAST TECHNIQUE: Multidetector CT imaging of the chest was performed using the standard protocol during bolus administration of intravenous contrast. Multiplanar CT image reconstructions and MIPs were obtained to evaluate the vascular anatomy. CONTRAST:  2m OMNIPAQUE IOHEXOL 350 MG/ML SOLN COMPARISON:  Chest radiographs dated 03/09/2021 FINDINGS: Cardiovascular: Satisfactory opacification of the bilateral pulmonary arteries to the segmental level. No evidence of pulmonary embolism. Although not tailored for evaluation of the thoracic aorta, there is no evidence of thoracic aortic aneurysm or dissection. Mild  atherosclerotic calcifications of the aortic arch. The heart is normal in size.  Small inferior pericardial effusion. Mediastinum/Nodes: No suspicious mediastinal lymphadenopathy. Visualized thyroid is unremarkable. Lungs/Pleura: Lungs are clear. No suspicious pulmonary nodules. No frank interstitial edema or focal consolidation. No pleural effusion or pneumothorax. Upper Abdomen: Visualized upper abdomen is notable for moderate hepatic steatosis. Musculoskeletal: Visualized osseous structures are within normal limits. Review of the MIP images confirms the above findings. IMPRESSION: No evidence of pulmonary embolism. Negative CT chest. Electronically Signed   By: SJulian HyM.D.   On: 03/09/2021 22:45   CT Abdomen Pelvis W Contrast  Result Date: 03/06/2021 CLINICAL DATA:  Abdominal pain and distension several months which is worse over the past 3-4 weeks. Intermittent  nausea and vomiting with diarrhea. History of IBS. Elevated LFTs. EXAM: CT ABDOMEN AND PELVIS WITH CONTRAST TECHNIQUE: Multidetector CT imaging of the abdomen and pelvis was performed using the standard protocol following bolus administration of intravenous contrast. CONTRAST:  56m OMNIPAQUE IOHEXOL 300 MG/ML  SOLN COMPARISON:  11/24/2020 FINDINGS: Lower chest: Stable borderline cardiomegaly. 3 mm nodule over the posterolateral right lower lobe. 2 mm nodule posteromedial right lower lobe as well as posterior left lower lobe. Hepatobiliary: Liver, gallbladder and biliary tree are unremarkable. Pancreas: Normal. Spleen: Normal. Adrenals/Urinary Tract: Adrenal glands are normal. Kidneys are normal in size without hydronephrosis or nephrolithiasis. Ureters and bladder are normal. Stomach/Bowel: Stomach and small bowel are normal. Appendix is normal. Mild contrast and fecal retention throughout the colon. Vascular/Lymphatic: Abdominal aorta is normal in caliber. No adenopathy. Reproductive: Uterus and ovaries are unremarkable. Other: No free  fluid or focal inflammatory change. Musculoskeletal: No focal abnormality. IMPRESSION: 1. No acute findings in the abdomen/pelvis. 2. Stable borderline cardiomegaly. Electronically Signed   By: DMarin OlpM.D.   On: 03/06/2021 09:52   ECHOCARDIOGRAM LIMITED  Result Date: 03/10/2021    ECHOCARDIOGRAM LIMITED REPORT   Patient Name:   Jennifer IDLERDate of Exam: 03/10/2021 Medical Rec #:  [id number]        Height:       56.7 in Accession #:    [id number]       Weight:       132.0 lb Date of Birth:  110/22/1992        BSA:          1.501 m Patient Age:    30 years          BP:           163/88 mmHg Patient Gender: F                 HR:           115 bpm. Exam Location:  Inpatient Procedure: Limited Echo, Color Doppler and Intracardiac Opacification Agent Indications:    Chest pain  History:        Patient has prior history of Echocardiogram examinations, most                 recent 01/11/2021. CHF; Risk Factors:Diabetes, Hypertension and                 Dyslipidemia.  Sonographer:    Melissa Morford RDCS (AE, PE) Referring Phys: [phone] Azyriah Nevins IMPRESSIONS  1. Left ventricular ejection fraction, by estimation, is 45%. Left ventricular ejection fraction by 3D volume is 46 %. The left ventricle has mildly decreased function. The left ventricle demonstrates regional wall motion abnormalities (basal inferior WMAs).  2. Right ventricular systolic function is normal. The right ventricular size is normal.  3. The mitral valve is normal in structure. No evidence of mitral valve regurgitation.  4. The aortic valve is tricuspid. Aortic valve regurgitation is trivial. Comparison(s): No significant change from prior study. FINDINGS  Left Ventricle: Left ventricular ejection fraction, by estimation, is 45%. Left ventricular ejection fraction by 3D volume is 46 %. The left ventricle has mildly decreased function. The left ventricle demonstrates regional wall motion abnormalities. The  left ventricular internal  cavity size was small. There is no left ventricular hypertrophy.  LV Wall Scoring: The basal inferior segment and basal inferoseptal segment are hypokinetic. Right Ventricle: The right ventricular size is normal. Right ventricular systolic function is normal. Left Atrium:  Left atrial size was normal in size. Pericardium: Trivial pericardial effusion is present. Mitral Valve: The mitral valve is normal in structure. Tricuspid Valve: The tricuspid valve is grossly normal. Aortic Valve: The aortic valve is tricuspid. Aortic valve regurgitation is trivial. Pulmonic Valve: The pulmonic valve was normal in structure. Pulmonic valve regurgitation is not visualized. No evidence of pulmonic stenosis. LEFT VENTRICLE PLAX 2D LVIDd:         3.70 cm LVIDs:         3.30 cm LV PW:         1.00 cm         3D Volume EF LV IVS:        0.80 cm         LV 3D EF:    Left LVOT diam:     2.00 cm                      ventricul LVOT Area:     3.14 cm                     ar                                             ejection                                             fraction                                             by 3D                                             volume is                                             46 %.                                 3D Volume EF:                                3D EF:        46 %                                LV EDV:       158 ml                                LV ESV:       85 ml  LV SV:        72 ml LEFT ATRIUM         Index LA diam:    2.90 cm 1.93 cm/m   AORTA Ao Root diam: 2.60 cm  SHUNTS Systemic Diam: 2.00 cm Rudean Haskell MD Electronically signed by Rudean Haskell MD Signature Date/Time: 03/10/2021/5:43:34 PM    Final       Subjective: Patient seen and examined at the bedside this morning.  Hemodynamically stable for discharge today .  Discharge Exam: Vitals:   03/11/21 0735 03/11/21 0929  BP: (!) 153/90 (!) 170/97  Pulse: (!) 107 (!)  117  Resp: 19   Temp: 98.4 F (36.9 C)   SpO2: 96%    Vitals:   03/10/21 2359 03/11/21 0358 03/11/21 0735 03/11/21 0929  BP: 123/71 (!) 150/91 (!) 153/90 (!) 170/97  Pulse: (!) 106  (!) 107 (!) 117  Resp: 19 16 19    Temp: 98.3 F (36.8 C) 98.3 F (36.8 C) 98.4 F (36.9 C)   TempSrc: Oral Oral Oral   SpO2: 96% 98% 96%   Weight:      Height:        General: Pt is alert, awake, not in acute distress Cardiovascular: RRR, S1/S2 +, no rubs, no gallops Respiratory: CTA bilaterally, no wheezing, no rhonchi Abdominal: Soft, NT, ND, bowel sounds + Extremities: no edema, no cyanosis    The results of significant diagnostics from this hospitalization (including imaging, microbiology, ancillary and laboratory) are listed below for reference.     Microbiology: Recent Results (from the past 240 hour(s))  Resp Panel by RT-PCR (Flu A&B, Covid) Nasopharyngeal Swab     Status: None   Collection Time: 03/09/21  9:56 PM   Specimen: Nasopharyngeal Swab; Nasopharyngeal(NP) swabs in vial transport medium  Result Value Ref Range Status   SARS Coronavirus 2 by RT PCR NEGATIVE NEGATIVE Final    Comment: (NOTE) SARS-CoV-2 target nucleic acids are NOT DETECTED.  The SARS-CoV-2 RNA is generally detectable in upper respiratory specimens during the acute phase of infection. The lowest concentration of SARS-CoV-2 viral copies this assay can detect is 138 copies/mL. A negative result does not preclude SARS-Cov-2 infection and should not be used as the sole basis for treatment or other patient management decisions. A negative result may occur with  improper specimen collection/handling, submission of specimen other than nasopharyngeal swab, presence of viral mutation(s) within the areas targeted by this assay, and inadequate number of viral copies(<138 copies/mL). A negative result must be combined with clinical observations, patient history, and epidemiological information. The expected result is  Negative.  Fact Sheet for Patients:  EntrepreneurPulse.com.au  Fact Sheet for Healthcare Providers:  IncredibleEmployment.be  This test is no t yet approved or cleared by the [place] FDA and  has been authorized for detection and/or diagnosis of SARS-CoV-2 by FDA under an Emergency Use Authorization (EUA). This EUA will remain  in effect (meaning this test can be used) for the duration of the COVID-19 declaration under Section 564(b)(1) of the Act, 21 U.S.C.section 360bbb-3(b)(1), unless the authorization is terminated  or revoked sooner.       Influenza A by PCR NEGATIVE NEGATIVE Final   Influenza B by PCR NEGATIVE NEGATIVE Final    Comment: (NOTE) The Xpert Xpress SARS-CoV-2/FLU/RSV plus assay is intended as an aid in the diagnosis of influenza from Nasopharyngeal swab specimens and should not be used as a sole basis for treatment. Nasal washings and aspirates are unacceptable for Xpert Xpress  SARS-CoV-2/FLU/RSV testing.  Fact Sheet for Patients: EntrepreneurPulse.com.au  Fact Sheet for Healthcare Providers: IncredibleEmployment.be  This test is not yet approved or cleared by the [place] FDA and has been authorized for detection and/or diagnosis of SARS-CoV-2 by FDA under an Emergency Use Authorization (EUA). This EUA will remain in effect (meaning this test can be used) for the duration of the COVID-19 declaration under Section 564(b)(1) of the Act, 21 U.S.C. section 360bbb-3(b)(1), unless the authorization is terminated or revoked.  Performed at [facility], [address] [place], [place] [address]      Labs: BNP (last 3 results) Recent Labs    01/24/21 1647 02/08/21 2030 03/09/21 2116  BNP 10.3 33.6 73.5   Basic Metabolic Panel: Recent Labs  Lab 03/09/21 2116 03/11/21 0104  NA 123* 134*  K 5.1 3.8  CL 89* 103  CO2 22 19*  GLUCOSE 500* 70  BUN  27* 30*  CREATININE 1.66* 1.52*  CALCIUM 9.9 9.4  MG 2.4  --    Liver Function Tests: Recent Labs  Lab 03/09/21 2116  AST 82*  ALT 74*  ALKPHOS 62  BILITOT 0.8  PROT 7.8  ALBUMIN 4.4   No results for input(s): LIPASE, AMYLASE in the last 168 hours. No results for input(s): AMMONIA in the last 168 hours. CBC: Recent Labs  Lab 03/09/21 2116 03/11/21 0104  WBC 7.5 7.5  HGB 12.8 12.8  HCT 37.6 36.7  MCV 87.0 88.0  PLT 201 201   Cardiac Enzymes: No results for input(s): CKTOTAL, CKMB, CKMBINDEX, TROPONINI in the last 168 hours. BNP: Invalid input(s): POCBNP CBG: Recent Labs  Lab 03/10/21 1613 03/10/21 2138 03/11/21 0057 03/11/21 0116 03/11/21 0729  GLUCAP 324* 155* 55* 85 165*   D-Dimer Recent Labs    03/09/21 2116  DDIMER 0.85*   Hgb A1c No results for input(s): HGBA1C in the last 72 hours. Lipid Profile No results for input(s): CHOL, HDL, LDLCALC, TRIG, CHOLHDL, LDLDIRECT in the last 72 hours. Thyroid function studies No results for input(s): TSH, T4TOTAL, T3FREE, THYROIDAB in the last 72 hours.  Invalid input(s): FREET3 Anemia work up No results for input(s): VITAMINB12, FOLATE, FERRITIN, TIBC, IRON, RETICCTPCT in the last 72 hours. Urinalysis    Component Value Date/Time   COLORURINE YELLOW 02/09/2021 0000   APPEARANCEUR CLEAR 02/09/2021 0000   LABSPEC 1.010 02/09/2021 0000   PHURINE 6.0 02/09/2021 0000   GLUCOSEU 150 (A) 02/09/2021 0000   HGBUR NEGATIVE 02/09/2021 0000   BILIRUBINUR NEGATIVE 02/09/2021 0000   KETONESUR NEGATIVE 02/09/2021 0000   PROTEINUR NEGATIVE 02/09/2021 0000   NITRITE NEGATIVE 02/09/2021 0000   LEUKOCYTESUR TRACE (A) 02/09/2021 0000   Sepsis Labs Invalid input(s): PROCALCITONIN,  WBC,  LACTICIDVEN Microbiology Recent Results (from the past 240 hour(s))  Resp Panel by RT-PCR (Flu A&B, Covid) Nasopharyngeal Swab     Status: None   Collection Time: 03/09/21  9:56 PM   Specimen: Nasopharyngeal Swab; Nasopharyngeal(NP)  swabs in vial transport medium  Result Value Ref Range Status   SARS Coronavirus 2 by RT PCR NEGATIVE NEGATIVE Final    Comment: (NOTE) SARS-CoV-2 target nucleic acids are NOT DETECTED.  The SARS-CoV-2 RNA is generally detectable in upper respiratory specimens during the acute phase of infection. The lowest concentration of SARS-CoV-2 viral copies this assay can detect is 138 copies/mL. A negative result does not preclude SARS-Cov-2 infection and should not be used as the sole basis for treatment or other patient management decisions. A negative result may occur with  improper specimen collection/handling, submission of specimen other than nasopharyngeal swab, presence of viral mutation(s) within the areas targeted by this assay, and inadequate number of viral copies(<138 copies/mL). A negative result must be combined with clinical observations, patient history, and epidemiological information. The expected result is Negative.  Fact Sheet for Patients:  EntrepreneurPulse.com.au  Fact Sheet for Healthcare Providers:  IncredibleEmployment.be  This test is no t yet approved or cleared by the [place] FDA and  has been authorized for detection and/or diagnosis of SARS-CoV-2 by FDA under an Emergency Use Authorization (EUA). This EUA will remain  in effect (meaning this test can be used) for the duration of the COVID-19 declaration under Section 564(b)(1) of the Act, 21 U.S.C.section 360bbb-3(b)(1), unless the authorization is terminated  or revoked sooner.       Influenza A by PCR NEGATIVE NEGATIVE Final   Influenza B by PCR NEGATIVE NEGATIVE Final    Comment: (NOTE) The Xpert Xpress SARS-CoV-2/FLU/RSV plus assay is intended as an aid in the diagnosis of influenza from Nasopharyngeal swab specimens and should not be used as a sole basis for treatment. Nasal washings and aspirates are unacceptable for Xpert Xpress  SARS-CoV-2/FLU/RSV testing.  Fact Sheet for Patients: EntrepreneurPulse.com.au  Fact Sheet for Healthcare Providers: IncredibleEmployment.be  This test is not yet approved or cleared by the [place] FDA and has been authorized for detection and/or diagnosis of SARS-CoV-2 by FDA under an Emergency Use Authorization (EUA). This EUA will remain in effect (meaning this test can be used) for the duration of the COVID-19 declaration under Section 564(b)(1) of the Act, 21 U.S.C. section 360bbb-3(b)(1), unless the authorization is terminated or revoked.  Performed at [facility], [address], [place], [place] [address]     Please note: You were cared for by a hospitalist during your hospital stay. Once you are discharged, your primary care physician will handle any further medical issues. Please note that NO REFILLS for any discharge medications will be authorized once you are discharged, as it is imperative that you return to your primary care physician (or establish a relationship with a primary care physician if you do not have one) for your post hospital discharge needs so that they can reassess your need for medications and monitor your lab values.    Time coordinating discharge: 40 minutes  SIGNED:   Shelly Coss, MD  [facility] 03/11/2021, 10:39 AM Pager [phone]  If 7PM-7AM, please contact night-coverage [email or web address] Password [email or web address]

## 2021-03-11 NOTE — Progress Notes (Signed)
Heart Failure Navigator Progress Note  Assessed for Heart & Vascular TOC clinic readiness.  Patient does not meet criteria due to prior to hospitalization pt established with AHF clinic. Last seen by Dr. Haroldine Laws 03/02/2021.    Navigator available for reassessment of patient.   Pricilla Holm, MSN, RN Heart Failure Nurse Navigator [phone]

## 2021-03-11 NOTE — Progress Notes (Signed)
Pt complaining of dizziness around 0050 lying I bed and said she felt like her blood sugar was low. Checked and heR CBG  was 55 at 0057. I gave her a cup of OJ. Recheck CBG was 85 at 0116 and pt stated she was starting to feel better. Wil continue to monitor.

## 2021-03-12 NOTE — Progress Notes (Signed)
[facility] Note  03/15/2021     CHIEF COMPLAINT Patient presents for Diabetic Eye Exam   HISTORY OF PRESENT ILLNESS: Jennifer [name] is a 30 y.o. female who presents to the clinic today for:   HPI     Diabetic Eye Exam   Vision is stable.  Associated Symptoms Negative for Flashes, Floaters, Distortion, Blind Spot, Pain, Redness, Photophobia, Glare, Trauma, Scalp Tenderness, Jaw Claudication, Shoulder/Hip pain, Fever, Weight Loss and Fatigue.  Diabetes characteristics include Type 1.  This started 14 years ago.  Last Blood Glucose 165.  Last A1C 8.9 (2 weeks ago).  Associated Diagnosis Neuropathy.        Comments   Patient referred for diabetic retinal evaluation. History of eye injections OU with Dr. Posey Pronto. Last injection OD was on 12.12.22. Last injection OS within the past month. Patient has history of brain tumor, diagnosed in 1997. Brain tumor is benign, but caused problems with ocular motility. Patient has had eye muscle surgery in 1998 to correct some of the motility restrictions. Patient does not report double vision. BS was 165 this am. Last a1c was 8.9, checked 2 weeks ago. Patient reports right eye does not close all the way during sleep, so she uses erythromycin ointment at night OD only.       Last edited by Jobe Marker, COT on 03/15/2021  8:43 AM.    Pt is here on the referral of her GI doctor, Dr. Lorenso Courier, pt is already a pt of Dr. Armanda Heritage, Dr. Lorenso Courier is concerned she has Wilson's syndrome, she wanted her checked for Kayser-Fleischer ring, pt had a brain tumor when she was 5, which has caused ptosis of her right eyelid and left-sided weakness, pt receives injections OU with Dr. Posey Pronto  Referring physician: Sharyn Creamer, MD [address],  [place] [id number]  HISTORICAL INFORMATION:   Selected notes from the MEDICAL RECORD NUMBER Referred by Dr.Ying Wilber Bihari for concern of Wilson's Disease -- check for Kayser-Fleischer  ring LEE:  Ocular Hx- PMH-    CURRENT MEDICATIONS: Current Outpatient Medications (Ophthalmic Drugs)  Medication Sig   erythromycin ophthalmic ointment Place one application into the right eye at bedtime.   No current facility-administered medications for this visit. (Ophthalmic Drugs)   Current Outpatient Medications (Other)  Medication Sig   albuterol (VENTOLIN HFA) 108 (90 Base) MCG/ACT inhaler Inhale 2 puffs into the lungs every 6 (six) hours as needed for wheezing or shortness of breath.   aspirin EC 81 MG EC tablet Take 1 tablet (81 mg total) by mouth daily. Swallow whole.   carvedilol (COREG) 6.25 MG tablet Take 1 tablet (6.25 mg total) by mouth 2 (two) times daily.   cholecalciferol (VITAMIN D) 25 MCG tablet Take 1 tablet (1,000 Units total) by mouth daily.   DULoxetine (CYMBALTA) 30 MG capsule Take 30 mg by mouth at bedtime.   DULoxetine (CYMBALTA) 30 MG capsule Take 1 capsule by mouth daily.   Evolocumab (REPATHA) 140 MG/ML SOSY Inject 140 mg into the skin See admin instructions. Inject 140 mg subcutaneously every other Sunday evening   fenofibrate 160 MG tablet Take 1 tablet (160 mg total) by mouth daily.   fluticasone furoate-vilanterol (BREO ELLIPTA) 100-25 MCG/INH AEPB Inhale 1 puff into the lungs daily as needed (shortness of breath).   insulin regular human CONCENTRATED (HUMULIN R) 500 UNIT/ML KwikPen Patient inject 170 u before breakfast and 140 u before lunch and dinner.   levothyroxine (  SYNTHROID) 125 MCG tablet Take 1 tablet (125 mcg total) by mouth daily at 6 (six) AM.   lipase/protease/amylase (CREON) 36000 UNITS CPEP capsule Take 1 capsule (36,000 Units total) by mouth 3 (three) times daily with meals. (Patient taking differently: Take 72,000 Units by mouth 3 (three) times daily with meals. Also take 1 capsule with snacks)   magnesium oxide (MAG-OX) 400 MG tablet Take by mouth.   Multiple Vitamin (MULTIVITAMIN WITH MINERALS) TABS tablet Take 1 tablet by mouth daily.    nitroGLYCERIN (NITROSTAT) 0.4 MG SL tablet Place 1 tablet (0.4 mg total) under the tongue every 5 (five) minutes as needed for up to 25 days for chest pain.   nystatin-triamcinolone (MYCOLOG II) cream nystatin-triamcinolone 100,000 unit/g-0.1 % topical cream  APPLY CREAM TOPICALLY TO AFFECTED AREA ONCE DAILY   ondansetron (ZOFRAN) 4 MG tablet Take 4 mg by mouth every 8 (eight) hours as needed for nausea or vomiting.   pantoprazole (PROTONIX) 40 MG tablet Take 40 mg by mouth daily.   pregabalin (LYRICA) 100 MG capsule Take 200 mg by mouth in the morning.   pregabalin (LYRICA) 100 MG capsule Take 1 capsule by mouth 2 (two) times daily.   pregabalin (LYRICA) 300 MG capsule Take 300 mg by mouth at bedtime.   prochlorperazine (COMPAZINE) 10 MG tablet Take 1 tablet (10 mg total) by mouth every 6 (six) hours as needed for nausea or vomiting.   REPATHA SURECLICK 426 MG/ML SOAJ Inject 1 Syringe into the skin every 14 (fourteen) days.   sacubitril-valsartan (ENTRESTO) 24-26 MG Take 1 tablet by mouth daily.   spironolactone (ALDACTONE) 25 MG tablet Patient takes 0.5 tablet daily.   torsemide (DEMADEX) 20 MG tablet Take 20 mg by mouth daily.   tretinoin (RETIN-A) 0.05 % cream Apply 1 application topically at bedtime.   oxyCODONE-acetaminophen (PERCOCET) 10-325 MG tablet Take 1 tablet by mouth every 6 (six) hours as needed for pain. (Patient not taking: Reported on 03/10/2021)   No current facility-administered medications for this visit. (Other)   REVIEW OF SYSTEMS: ROS   Positive for: Endocrine, Cardiovascular, Eyes Negative for: Constitutional, Gastrointestinal, Neurological, Skin, Genitourinary, Musculoskeletal, HENT, Respiratory, Psychiatric, Allergic/Imm, Heme/Lymph Last edited by Jobe Marker, COT on 03/15/2021  8:36 AM.     ALLERGIES Allergies  Allergen Reactions   Ketamine Other (See Comments)    In vegetative state for 15 minutes per pt   Clarithromycin Other (See Comments)     Stomach pain    Fentanyl Nausea And Vomiting   Maitake Mushroom [Maitake] Itching    Itchy throat   Penicillin V Itching    Gi upset   Shellfish Allergy Swelling and Other (See Comments)    Throat swelling. Pt states contrast in CT is okay   Morphine Itching and Rash   Penicillins Rash    Has patient had a PCN reaction causing immediate rash, facial/tongue/throat swelling, SOB or lightheadedness with hypotension: Y Has patient had a PCN reaction causing severe rash involving mucus membranes or skin necrosis: Y Has patient had a PCN reaction that required hospitalization: N Has patient had a PCN reaction occurring within the last 10 years: Y If all of the above answers are "NO", then may proceed with Cephalosporin use.    Prednisone Rash    PAST MEDICAL HISTORY Past Medical History:  Diagnosis Date   Brain tumor ([facility]) 03/29/1995   astrocytoma   CHF (congestive heart failure) ([facility])    Cholesterosis    DM (diabetes mellitus) ([facility]) 10/10/2018  Fatty liver    HTN (hypertension) 10/10/2018   Hypothyroidism 10/10/2018   Lipoprotein deficiency    Lung disease    longevity long term   Pancreatitis    Polycystic ovary syndrome    Past Surgical History:  Procedure Laterality Date   ABDOMINAL SURGERY     pt states during miscarriage got her intestine   BRAIN SURGERY     EYE MUSCLE SURGERY Right 03/28/2014   RIGHT/LEFT HEART CATH AND CORONARY ANGIOGRAPHY N/A 12/03/2020   Procedure: RIGHT/LEFT HEART CATH AND CORONARY ANGIOGRAPHY;  Surgeon: [name], MD;  Location: [facility];  Service: Cardiovascular;  Laterality: N/A;   VENTRICULOSTOMY  03/28/1997    FAMILY HISTORY Family History  Problem Relation Age of Onset   Diabetes Mother    Hypertension Mother    Hyperlipidemia Mother    Thyroid disease Mother    Hypertension Father    Diabetes Father    Pancreatic cancer Paternal Aunt    Pancreatic cancer Paternal Uncle    Colon cancer Neg Hx    Esophageal cancer Neg Hx     Stomach cancer Neg Hx    SOCIAL HISTORY Social History   Tobacco Use   Smoking status: Never   Smokeless tobacco: Never  Vaping Use   Vaping Use: Never used  Substance Use Topics   Alcohol use: Never   Drug use: Never       OPHTHALMIC EXAM:  Base Eye Exam     Visual Acuity (Snellen - Linear)       Right Left   Dist cc 20/70 -2 20/20   Dist ph cc 20/40 -2     Correction: Glasses         Tonometry (Tonopen, 8:54 AM)       Right Left   Pressure 15 17         Pupils       Dark Light Shape React APD   Right 3 3 Round Minimal None   Left 4 4 Round Minimal None         Visual Fields (Counting fingers)       Left Right    Full Full         Extraocular Movement       Right Left    -2 -3 -4  -2  -4  -4 -4 -4   0 0 0  0  0  0 0 0    RHT        Neuro/Psych     Oriented x3: Yes   Mood/Affect: Normal         Dilation     Both eyes: 1.0% Mydriacyl, 2.5% Phenylephrine @ 8:54 AM           Slit Lamp and Fundus Exam     Slit Lamp Exam       Right Left   Lids/Lashes Ptosis Meibomian gland dysfunction   Conjunctiva/Sclera White and quiet White and quiet   Cornea Clear; no Kayser-Fleischer ring Clear; no Kayser-Fleischer ring   Anterior Chamber Deep and quiet Deep and quiet   Iris Round and dilated, No NVI Round and dilated, No NVI   Lens Clear Clear   Anterior Vitreous clear clear         Fundus Exam       Right Left   Disc Pink and Sharp Pink and Sharp   C/D Ratio 0.3 0.3   Macula Flat, Good foveal reflex, temporal Atrophy, RPE mottling, No heme  or edema Flat, Good foveal reflex, trace ERM, mild focal exudates   Vessels attenuated, Tortuous, Copper wiring, AV crossing changes attenuated, Tortuous, Copper wiring, AV crossing changes   Periphery Attached, scattered PRP greatest temporal periphery Attached, scattered MA/DBH, 360 PRP           Refraction     Wearing Rx       Sphere Cylinder Axis   Right -8.00 +4.00  090   Left -5.00 +4.50 090         Manifest Refraction       Sphere Cylinder Axis Dist VA   Right -7.50 +4.00 090 20/50   Left -5.00 +4.50 090 20/20            IMAGING AND PROCEDURES  Imaging and Procedures for 03/15/2021  OCT, Retina - OU - Both Eyes       Right Eye Quality was good. Central Foveal Thickness: 276. Progression has no prior data. Findings include normal foveal contour, no IRF, no SRF, inner retinal atrophy (Focal IRA IT mac).   Left Eye Quality was good. Central Foveal Thickness: 237. Progression has no prior data. Findings include normal foveal contour, no IRF, no SRF.   Notes *Images captured and stored on drive  Diagnosis / Impression:  NFP; no IRF/SRF OU   Clinical management:  See below  Abbreviations: NFP - Normal foveal profile. CME - cystoid macular edema. PED - pigment epithelial detachment. IRF - intraretinal fluid. SRF - subretinal fluid. EZ - ellipsoid zone. ERM - epiretinal membrane. ORA - outer retinal atrophy. ORT - outer retinal tubulation. SRHM - subretinal hyper-reflective material. 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 Stable proliferative diabetic retinopathy of both eyes associated with type 2 diabetes mellitus (HCC)  E11.3553 OCT, Retina - OU - Both Eyes    3. Ptosis of right eyelid  H02.401       1. Wilson's Disease suspect  - under the expert management of Dr. Lorenso Courier  - Dr. Lorenso Courier referred here to check for Kayser-Fleischer ring to support a diagnosis  - no Sel[place] present OU  2. Proliferative diabetic retinopathy w/o DME, OU (OD > OS)  - pt sees Dr. Posey Pronto for injections OU, pt received OU injections within the past month - The incidence, risk factors for progression, natural history and treatment options for diabetic retinopathy were discussed with patient.   - The need for close monitoring of blood glucose, blood  pressure, and serum lipids, avoiding cigarette or any type of tobacco, and the need for long term follow up was also discussed with patient. - exam shows scattered MA/DBH, scattered PRP laser changes - f/u here PRN - continue to follow with Dr. Posey Pronto  3. Ptosis R upper eyelid  - long-standing  Ophthalmic Meds Ordered this visit:  No orders of the defined types were placed in this encounter.      Return if symptoms worsen or fail to improve.  There are no Patient Instructions on file for this visit.   Explained the diagnoses, plan, and follow up with the patient and they expressed understanding.  Patient expressed understanding of the importance of proper follow up care.   This document serves as a record of services personally performed by Gardiner Sleeper, MD, PhD. It was created on their behalf by Roselee Nova, COMT. The creation of this  record is the provider's dictation and/or activities during the visit.  Electronically signed by: Roselee Nova, COMT 03/15/21 12:58 PM  Gardiner Sleeper, M.D., Ph.D. Diseases & Surgery of the Retina and Vitreous [facility]  I have reviewed the above documentation for accuracy and completeness, and I agree with the above. Gardiner Sleeper, M.D., Ph.D. 03/15/21 12:58 PM    Abbreviations: M myopia (nearsighted); A astigmatism; H hyperopia (farsighted); P presbyopia; Mrx spectacle prescription;  CTL contact lenses; OD right eye; OS left eye; OU both eyes  XT exotropia; ET esotropia; PEK punctate epithelial keratitis; PEE punctate epithelial erosions; DES dry eye syndrome; MGD meibomian gland dysfunction; ATs artificial tears; PFAT's preservative free artificial tears; [place] nuclear sclerotic cataract; PSC posterior subcapsular cataract; ERM epi-retinal membrane; PVD posterior vitreous detachment; RD retinal detachment; DM diabetes mellitus; DR diabetic retinopathy; NPDR non-proliferative diabetic retinopathy; PDR proliferative  diabetic retinopathy; CSME clinically significant macular edema; DME diabetic macular edema; dbh dot blot hemorrhages; CWS cotton wool spot; POAG primary open angle glaucoma; C/D cup-to-disc ratio; HVF humphrey visual field; GVF goldmann visual field; OCT optical coherence tomography; IOP intraocular pressure; BRVO Branch retinal vein occlusion; CRVO central retinal vein occlusion; CRAO central retinal artery occlusion; BRAO branch retinal artery occlusion; RT retinal tear; SB scleral buckle; PPV pars plana vitrectomy; VH Vitreous hemorrhage; PRP panretinal laser photocoagulation; IVK intravitreal kenalog; VMT vitreomacular traction; MH Macular hole;  NVD neovascularization of the disc; NVE neovascularization elsewhere; AREDS age related eye disease study; ARMD age related macular degeneration; POAG primary open angle glaucoma; EBMD epithelial/anterior basement membrane dystrophy; ACIOL anterior chamber intraocular lens; IOL intraocular lens; PCIOL posterior chamber intraocular lens; Phaco/IOL phacoemulsification with intraocular lens placement; [place] photorefractive keratectomy; LASIK laser assisted in situ keratomileusis; HTN hypertension; DM diabetes mellitus; COPD chronic obstructive pulmonary disease

## 2021-03-13 ENCOUNTER — Encounter: Payer: Self-pay | Admitting: Internal Medicine

## 2021-03-15 ENCOUNTER — Ambulatory Visit (INDEPENDENT_AMBULATORY_CARE_PROVIDER_SITE_OTHER): Payer: BC Managed Care – PPO | Admitting: Ophthalmology

## 2021-03-15 ENCOUNTER — Other Ambulatory Visit: Payer: Self-pay

## 2021-03-15 ENCOUNTER — Encounter (INDEPENDENT_AMBULATORY_CARE_PROVIDER_SITE_OTHER): Payer: Self-pay | Admitting: Ophthalmology

## 2021-03-15 DIAGNOSIS — E113553 Type 2 diabetes mellitus with stable proliferative diabetic retinopathy, bilateral: Secondary | ICD-10-CM | POA: Diagnosis not present

## 2021-03-15 DIAGNOSIS — H02401 Unspecified ptosis of right eyelid: Secondary | ICD-10-CM

## 2021-03-18 ENCOUNTER — Encounter: Payer: Self-pay | Admitting: Internal Medicine

## 2021-03-18 NOTE — Progress Notes (Signed)
Received records from [facility] and [facility]. Patient's exam was negative for Kayser-Fleischer Rings. Still awaiting results of 24 hour urine copper.

## 2021-03-29 ENCOUNTER — Encounter (HOSPITAL_BASED_OUTPATIENT_CLINIC_OR_DEPARTMENT_OTHER): Payer: Self-pay | Admitting: Emergency Medicine

## 2021-03-29 ENCOUNTER — Emergency Department (HOSPITAL_BASED_OUTPATIENT_CLINIC_OR_DEPARTMENT_OTHER): Payer: BC Managed Care – PPO

## 2021-03-29 ENCOUNTER — Emergency Department (HOSPITAL_BASED_OUTPATIENT_CLINIC_OR_DEPARTMENT_OTHER)
Admission: EM | Admit: 2021-03-29 | Discharge: 2021-03-29 | Disposition: A | Discharge status: Home / Self Care | Payer: BC Managed Care – PPO | Attending: Emergency Medicine | Admitting: Emergency Medicine

## 2021-03-29 ENCOUNTER — Other Ambulatory Visit: Payer: Self-pay

## 2021-03-29 DIAGNOSIS — E119 Type 2 diabetes mellitus without complications: Secondary | ICD-10-CM | POA: Diagnosis not present

## 2021-03-29 DIAGNOSIS — Z20822 Contact with and (suspected) exposure to covid-19: Secondary | ICD-10-CM | POA: Diagnosis not present

## 2021-03-29 DIAGNOSIS — M6281 Muscle weakness (generalized): Secondary | ICD-10-CM | POA: Diagnosis not present

## 2021-03-29 DIAGNOSIS — Z7982 Long term (current) use of aspirin: Secondary | ICD-10-CM | POA: Diagnosis not present

## 2021-03-29 DIAGNOSIS — R590 Localized enlarged lymph nodes: Secondary | ICD-10-CM | POA: Diagnosis not present

## 2021-03-29 DIAGNOSIS — Z79899 Other long term (current) drug therapy: Secondary | ICD-10-CM | POA: Diagnosis not present

## 2021-03-29 DIAGNOSIS — I110 Hypertensive heart disease with heart failure: Secondary | ICD-10-CM | POA: Diagnosis not present

## 2021-03-29 DIAGNOSIS — I509 Heart failure, unspecified: Secondary | ICD-10-CM | POA: Insufficient documentation

## 2021-03-29 DIAGNOSIS — D72829 Elevated white blood cell count, unspecified: Secondary | ICD-10-CM | POA: Diagnosis not present

## 2021-03-29 DIAGNOSIS — J020 Streptococcal pharyngitis: Secondary | ICD-10-CM | POA: Diagnosis not present

## 2021-03-29 DIAGNOSIS — Z794 Long term (current) use of insulin: Secondary | ICD-10-CM | POA: Diagnosis not present

## 2021-03-29 DIAGNOSIS — D649 Anemia, unspecified: Secondary | ICD-10-CM | POA: Diagnosis not present

## 2021-03-29 DIAGNOSIS — M62422 Contracture of muscle, left upper arm: Secondary | ICD-10-CM | POA: Insufficient documentation

## 2021-03-29 DIAGNOSIS — R000 Tachycardia, unspecified: Secondary | ICD-10-CM | POA: Diagnosis not present

## 2021-03-29 DIAGNOSIS — E860 Dehydration: Secondary | ICD-10-CM | POA: Diagnosis not present

## 2021-03-29 DIAGNOSIS — J029 Acute pharyngitis, unspecified: Secondary | ICD-10-CM | POA: Diagnosis present

## 2021-03-29 DIAGNOSIS — R0602 Shortness of breath: Secondary | ICD-10-CM | POA: Diagnosis not present

## 2021-03-29 LAB — CBC WITH DIFFERENTIAL/PLATELET
Abs Immature Granulocytes: 0.09 10*3/uL — ABNORMAL HIGH (ref 0.00–0.07)
Basophils Absolute: 0 10*3/uL (ref 0.0–0.1)
Basophils Relative: 0 %
Eosinophils Absolute: 0.1 10*3/uL (ref 0.0–0.5)
Eosinophils Relative: 1 %
HCT: 32.8 % — ABNORMAL LOW (ref 36.0–46.0)
Hemoglobin: 10.7 g/dL — ABNORMAL LOW (ref 12.0–15.0)
Immature Granulocytes: 1 %
Lymphocytes Relative: 9 %
Lymphs Abs: 1.1 10*3/uL (ref 0.7–4.0)
MCH: 28.2 pg (ref 26.0–34.0)
MCHC: 32.6 g/dL (ref 30.0–36.0)
MCV: 86.3 fL (ref 80.0–100.0)
Monocytes Absolute: 1 10*3/uL (ref 0.1–1.0)
Monocytes Relative: 8 %
Neutro Abs: 10.2 10*3/uL — ABNORMAL HIGH (ref 1.7–7.7)
Neutrophils Relative %: 81 %
Platelets: 138 10*3/uL — ABNORMAL LOW (ref 150–400)
RBC: 3.8 MIL/uL — ABNORMAL LOW (ref 3.87–5.11)
RDW: 15.6 % — ABNORMAL HIGH (ref 11.5–15.5)
WBC: 12.5 10*3/uL — ABNORMAL HIGH (ref 4.0–10.5)
nRBC: 0 % (ref 0.0–0.2)

## 2021-03-29 LAB — COMPREHENSIVE METABOLIC PANEL
ALT: 64 U/L — ABNORMAL HIGH (ref 0–44)
AST: 71 U/L — ABNORMAL HIGH (ref 15–41)
Albumin: 4.5 g/dL (ref 3.5–5.0)
Alkaline Phosphatase: 46 U/L (ref 38–126)
Anion gap: 10 (ref 5–15)
BUN: 30 mg/dL — ABNORMAL HIGH (ref 6–20)
CO2: 23 mmol/L (ref 22–32)
Calcium: 9.5 mg/dL (ref 8.9–10.3)
Chloride: 101 mmol/L (ref 98–111)
Creatinine, Ser: 1.39 mg/dL — ABNORMAL HIGH (ref 0.44–1.00)
GFR, Estimated: 53 mL/min — ABNORMAL LOW (ref >60)
Glucose, Bld: 94 mg/dL (ref 70–99)
Potassium: 3.9 mmol/L (ref 3.5–5.1)
Sodium: 134 mmol/L — ABNORMAL LOW (ref 135–145)
Total Bilirubin: 0.9 mg/dL (ref 0.3–1.2)
Total Protein: 7.8 g/dL (ref 6.5–8.1)

## 2021-03-29 LAB — GROUP A STREP BY PCR: Group A Strep by PCR: DETECTED — AB

## 2021-03-29 LAB — CBG MONITORING, ED: Glucose-Capillary: 54 mg/dL — ABNORMAL LOW (ref 70–99)

## 2021-03-29 LAB — LACTIC ACID, PLASMA: Lactic Acid, Venous: 1.6 mmol/L (ref 0.5–1.9)

## 2021-03-29 LAB — RESP PANEL BY RT-PCR (FLU A&B, COVID) ARPGX2
Influenza A by PCR: NEGATIVE
Influenza B by PCR: NEGATIVE
SARS Coronavirus 2 by RT PCR: NEGATIVE

## 2021-03-29 MED ORDER — CLINDAMYCIN PHOSPHATE 300 MG/50ML IV SOLN
300.0000 mg | Freq: Once | INTRAVENOUS | Status: AC
  Administered 2021-03-29: 300 mg via INTRAVENOUS
  Filled 2021-03-29: qty 50

## 2021-03-29 MED ORDER — ACETAMINOPHEN 500 MG PO TABS
1000.0000 mg | ORAL_TABLET | Freq: Once | ORAL | Status: AC
Start: 2021-03-29 — End: 2021-03-29
  Administered 2021-03-29: 1000 mg via ORAL
  Filled 2021-03-29: qty 2

## 2021-03-29 MED ORDER — SODIUM CHLORIDE 0.9 % IV SOLN
INTRAVENOUS | Status: DC | PRN
  Administered 2021-03-29: 10 mL/h via INTRAVENOUS

## 2021-03-29 MED ORDER — CLINDAMYCIN HCL 300 MG PO CAPS: 300.0000 mg | ORAL_CAPSULE | Freq: Three times a day (TID) | ORAL | 0 refills | Status: AC

## 2021-03-29 MED ORDER — LACTATED RINGERS IV BOLUS
500.0000 mL | Freq: Once | INTRAVENOUS | Status: AC
  Administered 2021-03-29: 500 mL via INTRAVENOUS

## 2021-03-29 MED ORDER — CARVEDILOL 6.25 MG PO TABS
6.2500 mg | ORAL_TABLET | Freq: Two times a day (BID) | ORAL | Status: DC
  Administered 2021-03-29: 6.25 mg via ORAL
  Filled 2021-03-29: qty 1

## 2021-03-29 NOTE — ED Provider Notes (Signed)
[facility] EMERGENCY DEPT Provider Note   CSN: [id number] Arrival date & time: 03/29/21  1419     History  Chief Complaint  Patient presents with   Generalized Body Aches    Jennifer [name] is a 31 y.o. female.  Pt is a 31y/o female with hx of nonischemic cardiomyopathy, brain tumor status post surgery with chronic left sided weakness, hypertension, diabetes type 2, PCOS, pancreatitis, chronic hyponatremia(124) with recent admission and d/c from the hospital on 03/11/21 who is presenting today complaining of shortness of breath, fever, sore throat and difficulty swallowing.  She reports she started having body aches on New Year's Eve but her symptoms really became severe yesterday.  She reports the pain in her throat is a 10 out of 10 and she has not eaten or drank anything in the last 24 hours because it is too painful.  She did take Aleve and Tylenol for the fever but did not feel that it helped.  She last had Tylenol at 8 AM.  She has not had significant cough but has felt short of breath and felt that she has had some wheezing or crackles in her chest.  She did weigh today and she reports her weight was 4 pounds up but she reports that her weight does fluctuate a lot and she does not take it at the same time every day.  She did take a dose of her diuretic this morning and has taken all of her morning medication.  Because her symptoms were not improving she called 911.  [name] complaining of still feeling short of breath which is constant.  Nothing seems to make it better but it is worse if she tries moving around.  She has no significant chest pain at this time and has not noticed any swelling in her legs.  She denies any abdominal pain, nausea, vomiting or diarrhea at this time.  The history is provided by the patient.      Home Medications Prior to Admission medications   Medication Sig Start Date End Date Taking? Authorizing Provider  albuterol (VENTOLIN HFA) 108 (90  Base) MCG/ACT inhaler Inhale 2 puffs into the lungs every 6 (six) hours as needed for wheezing or shortness of breath. 12/31/18   [provider]  aspirin EC 81 MG EC tablet Take 1 tablet (81 mg total) by mouth daily. Swallow whole. 12/07/20   Ezekiel Slocumb, DO  carvedilol (COREG) 6.25 MG tablet Take 1 tablet (6.25 mg total) by mouth 2 (two) times daily. 03/11/21   Shelly Coss, MD  cholecalciferol (VITAMIN D) 25 MCG tablet Take 1 tablet (1,000 Units total) by mouth daily. 12/07/20   Ezekiel Slocumb, DO  DULoxetine (CYMBALTA) 30 MG capsule Take 30 mg by mouth at bedtime. 01/28/19 12/16/21  [provider]  DULoxetine (CYMBALTA) 30 MG capsule Take 1 capsule by mouth daily. 02/15/21   [provider]  erythromycin ophthalmic ointment Place one application into the right eye at bedtime. 03/25/20   [provider]  Evolocumab (REPATHA) 140 MG/ML SOSY Inject 140 mg into the skin See admin instructions. Inject 140 mg subcutaneously every other Sunday evening    [provider]  fenofibrate 160 MG tablet Take 1 tablet (160 mg total) by mouth daily. 12/07/20   Nicole Kindred A, DO  fluticasone furoate-vilanterol (BREO ELLIPTA) 100-25 MCG/INH AEPB Inhale 1 puff into the lungs daily as needed (shortness of breath). 01/21/19   [provider]  insulin regular human CONCENTRATED (HUMULIN  R) 500 UNIT/ML KwikPen Patient inject 170 u before breakfast and 140 u before lunch and dinner. 01/28/21   [provider]  levothyroxine (SYNTHROID) 125 MCG tablet Take 1 tablet (125 mcg total) by mouth daily at 6 (six) AM. 12/07/20   Ezekiel Slocumb, DO  lipase/protease/amylase (CREON) 36000 UNITS CPEP capsule Take 1 capsule (36,000 Units total) by mouth 3 (three) times daily with meals. Patient taking differently: Take 72,000 Units by mouth 3 (three) times daily with meals. Also take 1 capsule with snacks 12/06/20   Nicole Kindred A, DO  magnesium oxide (MAG-OX)  400 MG tablet Take by mouth. 06/24/20   [provider]  Multiple Vitamin (MULTIVITAMIN WITH MINERALS) TABS tablet Take 1 tablet by mouth daily. 12/07/20   Ezekiel Slocumb, DO  nitroGLYCERIN (NITROSTAT) 0.4 MG SL tablet Place 1 tablet (0.4 mg total) under the tongue every 5 (five) minutes as needed for up to 25 days for chest pain. 08/01/19 01/06/22  [name], MD  nystatin-triamcinolone (MYCOLOG II) cream nystatin-triamcinolone 100,000 unit/g-0.1 % topical cream  APPLY CREAM TOPICALLY TO AFFECTED AREA ONCE DAILY    [provider]  ondansetron (ZOFRAN) 4 MG tablet Take 4 mg by mouth every 8 (eight) hours as needed for nausea or vomiting. 12/19/18   [provider]  oxyCODONE-acetaminophen (PERCOCET) 10-325 MG tablet Take 1 tablet by mouth every 6 (six) hours as needed for pain. Patient not taking: Reported on 03/10/2021 01/18/21   Molpus, John, MD  pantoprazole (PROTONIX) 40 MG tablet Take 40 mg by mouth daily. 12/24/19   [provider]  pregabalin (LYRICA) 100 MG capsule Take 200 mg by mouth in the morning. 10/09/20   [provider]  pregabalin (LYRICA) 100 MG capsule Take 1 capsule by mouth 2 (two) times daily.    [provider]  pregabalin (LYRICA) 300 MG capsule Take 300 mg by mouth at bedtime.    [provider]  prochlorperazine (COMPAZINE) 10 MG tablet Take 1 tablet (10 mg total) by mouth every 6 (six) hours as needed for nausea or vomiting. 01/18/21   Molpus, John, MD  REPATHA SURECLICK 485 MG/ML SOAJ Inject 1 Syringe into the skin every 14 (fourteen) days. 02/20/21   [provider]  sacubitril-valsartan (ENTRESTO) 24-26 MG Take 1 tablet by mouth daily.    [provider]  spironolactone (ALDACTONE) 25 MG tablet Patient takes 0.5 tablet daily.    [provider]  torsemide (DEMADEX) 20 MG tablet Take 20 mg by mouth daily.    [provider]  tretinoin (RETIN-A) 0.05 % cream Apply 1 application  topically at bedtime. 01/17/21   [provider]      Allergies    Ketamine, Clarithromycin, Fentanyl, M[place]ake mushroom [maitake], Penicillin v, Shellfish allergy, Morphine, Penicillins, and Prednisone    Review of Systems   Review of Systems  All other systems reviewed and are negative.  Physical Exam Updated Vital Signs BP (!) 146/85 (BP Location: Left Arm)    Pulse (!) 131    Temp 100.2 F (37.9 C) (Oral)    Resp (!) 24    Ht 4' 9"  (1.448 m)    Wt 61.7 kg    LMP 02/18/2021    SpO2 98%    BMI 29.43 kg/m  Physical Exam Vitals and nursing note reviewed.  Constitutional:      General: [name] not in acute distress.    Appearance: [name] well-developed. [name] ill-appearing.     Comments: Chronically  ill-appearing  HENT:     Head: Normocephalic and atraumatic.     Mouth/Throat:     Mouth: Mucous membranes are dry.     Pharynx: Oropharyngeal exudate and posterior oropharyngeal erythema present.  Eyes:     Pupils: Pupils are equal, round, and reactive to light.  Neck:     Comments: No voice changes Cardiovascular:     Rate and Rhythm: Regular rhythm. Tachycardia present.     Heart sounds: Normal heart sounds. No murmur heard.   No friction rub.  Pulmonary:     Effort: Pulmonary effort is normal.     Breath sounds: Normal breath sounds. No wheezing or rales.     Comments: Mild decreased breath sounds in the lower lobes but otherwise clear throughout Chest:     Chest wall: No tenderness.  Abdominal:     General: Bowel sounds are normal. There is no distension.     Palpations: Abdomen is soft.     Tenderness: There is no abdominal tenderness. There is no guarding or rebound.  Musculoskeletal:        General: No tenderness. Normal range of motion.     Cervical back: Normal range of motion and neck supple.     Right lower leg: No edema.     Left lower leg: No edema.     Comments: No edema  Lymphadenopathy:     Cervical: Cervical adenopathy present.  Skin:     General: Skin is warm and dry.     Findings: No rash.  Neurological:     Mental Status: [name] alert and oriented to person, place, and time. Mental status is at baseline.     Cranial Nerves: No cranial nerve deficit.     Comments: Weakness noted to the left upper and lower extremity with contracture of the left upper extremity  Psychiatric:        Behavior: Behavior normal.    ED Results / Procedures / Treatments   Labs (all labs ordered are listed, but only abnormal results are displayed) Labs Reviewed  GROUP A STREP BY PCR - Abnormal; Notable for the following components:      Result Value   Group A Strep by PCR DETECTED (*)    All other components within normal limits  CBC WITH DIFFERENTIAL/PLATELET - Abnormal; Notable for the following components:   WBC 12.5 (*)    RBC 3.80 (*)    Hemoglobin 10.7 (*)    HCT 32.8 (*)    RDW 15.6 (*)    Platelets 138 (*)    Neutro Abs 10.2 (*)    Abs Immature Granulocytes 0.09 (*)    All other components within normal limits  COMPREHENSIVE METABOLIC PANEL - Abnormal; Notable for the following components:   Sodium 134 (*)    BUN 30 (*)    Creatinine, Ser 1.39 (*)    AST 71 (*)    ALT 64 (*)    GFR, Estimated 53 (*)    All other components within normal limits  RESP PANEL BY RT-PCR (FLU A&B, COVID) [id number]  LACTIC ACID, PLASMA    EKG EKG Interpretation  Date/Time:  Monday March 29 2021 15:45:13 EST Ventricular Rate:  140 PR Interval:  111 QRS Duration: 88 QT Interval:  291 QTC Calculation: 445 R Axis:   19 Text Interpretation: Sinus tachycardia LVH by voltage No significant change since last tracing Confirmed by Blanchie Dessert [phone]) on 03/29/2021 4:34:06 PM  Radiology DG Chest [facility] 1 [address]  Result Date: 03/29/2021 CLINICAL DATA:  Shortness of breath, sore throat and fever. EXAM: PORTABLE CHEST 1 VIEW COMPARISON:  March 09, 2021 FINDINGS: The heart size and mediastinal contours are within normal limits. Both lungs are  clear. The visualized skeletal structures are unremarkable. IMPRESSION: No active disease. Electronically Signed   By: Abelardo Diesel M.D.   On: 03/29/2021 15:26    Procedures Procedures    Medications Ordered in ED Medications  clindamycin (CLEOCIN) IVPB 300 mg (has no administration in time range)  carvedilol (COREG) tablet 6.25 mg (has no administration in time range)  acetaminophen (TYLENOL) tablet 1,000 mg (1,000 mg Oral Given 03/29/21 1539)  lactated ringers bolus 500 mL (0 mLs Intravenous Stopped 03/29/21 1635)    ED Course/ Medical Decision Making/ A&P                           Medical Decision Making  Patient is a 31 year old female with multiple medical problems presenting today with fever, tachycardia, shortness of breath but sats at 100% on room air.  Patient is febrile here and is complaining of a sore throat and does appear to have some exudates on her throat but no significant swelling.  Concern for flu, COVID, strep throat.  Concern for pneumonia.  Patient does have a history of CHF and most recent EF on an echo done a few weeks ago showed an EF of 40 to 45% which is unchanged.  She does not have evidence of fluid overloaded today and appears dehydrated.  We will give judicious fluids, fever control, labs and imaging are pending.  Low suspicion for an acute abdominal process at this time.  Lower suspicion for DKA.  4:57 PM Patient's labs were reviewed and interpreted by myself with positive strep throat, negative for COVID and flu, mild leukocytosis with a white count of 12.5 and mild anemia today with hemoglobin of 10.7 from her baseline of 11, CMP with persistently improved sodium of 134 and improved creatinine of 1.39 from 1.5.  LFTs remain mildly elevated but improved from prior, lactic acid is still within normal limits.  Chest x-ray was reviewed and independently interpreted by myself and was clear.  Agree with the radiologist evaluation.  Findings were discussed with the  patient and her mother was on the phone.  There do not appear to be any social determinants up affecting the patient's care today.  She does continue to be tachycardic with a heart rate in the 130s.  With EKG showing sinus tachycardia but no other acute changes.  When speaking with her she thinks she is not taking her correct dose of carvedilol.  She will be given her evening dose here.  We will give her a dose of clindamycin for her strep throat as she has a penicillin allergy.  Hopeful that she will be improved and can be discharged home.  Oxygen saturation remains in the high 90s.  She was able to ambulate to the bathroom independently.  8:00 PM Pt is improved.  hR now in the low 100's.  Pt is tolerating po's and will d/c home.  Pt given return precautions.         Final Clinical Impression(s) / ED Diagnoses Final diagnoses:  Strep pharyngitis  Dehydration  Sinus tachycardia    Rx / DC Orders ED Discharge Orders          Ordered    clindamycin (CLEOCIN) 300 MG capsule  3 times daily  03/29/21 2002              Blanchie Dessert, MD 03/29/21 2003

## 2021-03-29 NOTE — Discharge Instructions (Signed)
You can start taking your antibiotic tomorrow morning.  You can continue to use Tylenol for fever and discomfort.  Make sure you are staying hydrated.  If you start having worsening symptoms, inability to swallow or becoming very short of breath please return to the emergency room.  Make sure you change your toothbrush in the next 24 to 48 hours after starting the antibiotic.

## 2021-03-29 NOTE — ED Notes (Signed)
Mother called concerned this RN had not been in patients room to address her low blood sugar. I explained to mother that I had been in another pt's room and was just made aware of pt's current blood sugar

## 2021-03-29 NOTE — ED Triage Notes (Signed)
Pt arrives to ED with c/o body aches. Associated symptoms include sore throat, headache, fever. This started last night, 1/1.

## 2021-04-05 ENCOUNTER — Ambulatory Visit: Payer: BC Managed Care – PPO | Admitting: Internal Medicine

## 2021-05-10 ENCOUNTER — Other Ambulatory Visit (HOSPITAL_COMMUNITY): Payer: Self-pay

## 2021-05-10 ENCOUNTER — Encounter (HOSPITAL_COMMUNITY): Payer: Self-pay | Admitting: Internal Medicine

## 2021-05-10 MED ORDER — IVABRADINE HCL 5 MG PO TABS: 5.0000 mg | ORAL_TABLET | Freq: Two times a day (BID) | ORAL | 3 refills | Status: DC

## 2021-05-12 DIAGNOSIS — R140 Abdominal distension (gaseous): Secondary | ICD-10-CM | POA: Insufficient documentation

## 2021-05-12 DIAGNOSIS — E1143 Type 2 diabetes mellitus with diabetic autonomic (poly)neuropathy: Secondary | ICD-10-CM

## 2021-05-12 DIAGNOSIS — I4891 Unspecified atrial fibrillation: Secondary | ICD-10-CM

## 2021-05-12 HISTORY — DX: Gastroparesis: E11.43

## 2021-05-12 HISTORY — DX: Unspecified atrial fibrillation: I48.91

## 2021-05-13 ENCOUNTER — Encounter (HOSPITAL_BASED_OUTPATIENT_CLINIC_OR_DEPARTMENT_OTHER): Payer: Self-pay | Admitting: Emergency Medicine

## 2021-05-13 ENCOUNTER — Telehealth (HOSPITAL_COMMUNITY): Payer: Self-pay

## 2021-05-13 ENCOUNTER — Emergency Department (HOSPITAL_BASED_OUTPATIENT_CLINIC_OR_DEPARTMENT_OTHER)
Admission: EM | Admit: 2021-05-13 | Discharge: 2021-05-14 | Discharge status: Against Medical Advice | Payer: BC Managed Care – PPO | Attending: Emergency Medicine | Admitting: Emergency Medicine

## 2021-05-13 ENCOUNTER — Emergency Department (HOSPITAL_BASED_OUTPATIENT_CLINIC_OR_DEPARTMENT_OTHER): Payer: BC Managed Care – PPO

## 2021-05-13 ENCOUNTER — Other Ambulatory Visit: Payer: Self-pay

## 2021-05-13 ENCOUNTER — Emergency Department (HOSPITAL_BASED_OUTPATIENT_CLINIC_OR_DEPARTMENT_OTHER): Payer: BC Managed Care – PPO | Admitting: Radiology

## 2021-05-13 ENCOUNTER — Encounter (HOSPITAL_COMMUNITY): Payer: Self-pay

## 2021-05-13 DIAGNOSIS — N189 Chronic kidney disease, unspecified: Secondary | ICD-10-CM | POA: Diagnosis not present

## 2021-05-13 DIAGNOSIS — Z7982 Long term (current) use of aspirin: Secondary | ICD-10-CM | POA: Diagnosis not present

## 2021-05-13 DIAGNOSIS — N1832 Chronic kidney disease, stage 3b: Secondary | ICD-10-CM | POA: Diagnosis present

## 2021-05-13 DIAGNOSIS — E1122 Type 2 diabetes mellitus with diabetic chronic kidney disease: Secondary | ICD-10-CM | POA: Insufficient documentation

## 2021-05-13 DIAGNOSIS — N179 Acute kidney failure, unspecified: Secondary | ICD-10-CM | POA: Diagnosis not present

## 2021-05-13 DIAGNOSIS — Z20822 Contact with and (suspected) exposure to covid-19: Secondary | ICD-10-CM | POA: Diagnosis not present

## 2021-05-13 DIAGNOSIS — N184 Chronic kidney disease, stage 4 (severe): Secondary | ICD-10-CM

## 2021-05-13 DIAGNOSIS — Z5329 Procedure and treatment not carried out because of patient's decision for other reasons: Secondary | ICD-10-CM

## 2021-05-13 DIAGNOSIS — Z8679 Personal history of other diseases of the circulatory system: Secondary | ICD-10-CM

## 2021-05-13 DIAGNOSIS — Z794 Long term (current) use of insulin: Secondary | ICD-10-CM | POA: Diagnosis not present

## 2021-05-13 DIAGNOSIS — E871 Hypo-osmolality and hyponatremia: Secondary | ICD-10-CM | POA: Diagnosis not present

## 2021-05-13 DIAGNOSIS — R799 Abnormal finding of blood chemistry, unspecified: Secondary | ICD-10-CM

## 2021-05-13 DIAGNOSIS — R000 Tachycardia, unspecified: Secondary | ICD-10-CM

## 2021-05-13 DIAGNOSIS — G8110 Spastic hemiplegia affecting unspecified side: Secondary | ICD-10-CM | POA: Diagnosis not present

## 2021-05-13 DIAGNOSIS — I509 Heart failure, unspecified: Secondary | ICD-10-CM | POA: Diagnosis not present

## 2021-05-13 HISTORY — DX: Chronic kidney disease, stage 4 (severe): N18.4

## 2021-05-13 LAB — CBC WITH DIFFERENTIAL/PLATELET
Abs Immature Granulocytes: 0.03 10*3/uL (ref 0.00–0.07)
Basophils Absolute: 0.1 10*3/uL (ref 0.0–0.1)
Basophils Relative: 1 %
Eosinophils Absolute: 0.1 10*3/uL (ref 0.0–0.5)
Eosinophils Relative: 2 %
HCT: 31.1 % — ABNORMAL LOW (ref 36.0–46.0)
Hemoglobin: 10.3 g/dL — ABNORMAL LOW (ref 12.0–15.0)
Immature Granulocytes: 0 %
Lymphocytes Relative: 23 %
Lymphs Abs: 1.6 10*3/uL (ref 0.7–4.0)
MCH: 28.9 pg (ref 26.0–34.0)
MCHC: 33.1 g/dL (ref 30.0–36.0)
MCV: 87.1 fL (ref 80.0–100.0)
Monocytes Absolute: 0.6 10*3/uL (ref 0.1–1.0)
Monocytes Relative: 9 %
Neutro Abs: 4.5 10*3/uL (ref 1.7–7.7)
Neutrophils Relative %: 65 %
Platelets: 201 10*3/uL (ref 150–400)
RBC: 3.57 MIL/uL — ABNORMAL LOW (ref 3.87–5.11)
RDW: 16 % — ABNORMAL HIGH (ref 11.5–15.5)
WBC: 7 10*3/uL (ref 4.0–10.5)
nRBC: 0 % (ref 0.0–0.2)

## 2021-05-13 LAB — URINALYSIS, ROUTINE W REFLEX MICROSCOPIC
Bilirubin Urine: NEGATIVE
Glucose, UA: 100 mg/dL — AB
Hgb urine dipstick: NEGATIVE
Ketones, ur: NEGATIVE mg/dL
Leukocytes,Ua: NEGATIVE
Nitrite: NEGATIVE
Protein, ur: NEGATIVE mg/dL
Specific Gravity, Urine: 1.01 (ref 1.005–1.030)
pH: 5 (ref 5.0–8.0)

## 2021-05-13 LAB — BASIC METABOLIC PANEL
Anion gap: 16 — ABNORMAL HIGH (ref 5–15)
BUN: 110 mg/dL — ABNORMAL HIGH (ref 6–20)
CO2: 22 mmol/L (ref 22–32)
Calcium: 9.9 mg/dL (ref 8.9–10.3)
Chloride: 91 mmol/L — ABNORMAL LOW (ref 98–111)
Creatinine, Ser: 3.56 mg/dL — ABNORMAL HIGH (ref 0.44–1.00)
GFR, Estimated: 17 mL/min — ABNORMAL LOW (ref >60)
Glucose, Bld: 199 mg/dL — ABNORMAL HIGH (ref 70–99)
Potassium: 5 mmol/L (ref 3.5–5.1)
Sodium: 129 mmol/L — ABNORMAL LOW (ref 135–145)

## 2021-05-13 LAB — MAGNESIUM: Magnesium: 2.2 mg/dL (ref 1.7–2.4)

## 2021-05-13 LAB — RESP PANEL BY RT-PCR (FLU A&B, COVID) ARPGX2
Influenza A by PCR: NEGATIVE
Influenza B by PCR: NEGATIVE
SARS Coronavirus 2 by RT PCR: NEGATIVE

## 2021-05-13 LAB — PREGNANCY, URINE: Preg Test, Ur: NEGATIVE

## 2021-05-13 LAB — BRAIN NATRIURETIC PEPTIDE: B Natriuretic Peptide: 12 pg/mL (ref 0.0–100.0)

## 2021-05-13 MED ORDER — SODIUM CHLORIDE 0.9 % IV BOLUS
500.0000 mL | Freq: Once | INTRAVENOUS | Status: AC
  Administered 2021-05-13: 500 mL via INTRAVENOUS

## 2021-05-13 MED ORDER — SODIUM CHLORIDE 0.9 % IV SOLN: INTRAVENOUS | Status: DC

## 2021-05-13 NOTE — ED Triage Notes (Signed)
Pt sent for Potassium 7.8.

## 2021-05-13 NOTE — ED Provider Notes (Cosign Needed)
[facility] EMERGENCY DEPT Provider Note   CSN: [id number] Arrival date & time: 05/13/21  1704     History  Chief Complaint  Patient presents with   Abnormal Lab    Jennifer [name] is a 31 y.o. female with extensive past medical history here for evaluation of abnormal labs.  Was getting pre- op blood work for upper endoscopy, was called today told to come to the emergency department for evaluation due to abnormal blood work.  Was told she had hyperkalemia as well as acute renal failure.  [name] followed by Dr. Haroldine Laws with cardiology for heart failure.  Apparently she has had increasing lower extremity swelling over the last 2 weeks and has been taking 60 mg twice daily torsemide (120 mg total> verified with patient twice for dosage).  [name] originally prescribed 20 mg twice daily.  States she overall feels fluid overloaded however this has overall improved over the last few days.  She denies any fever, chest pain, shortness of breath, abdominal pain.  Has had some improvement in her lower extremity swelling since taking the torsemide.  States she was told she needs to follow-up with nephrology to establish care to to ckd.  Left extremity weakness at baseline. Urinating without difficulty   Prior Cards note>> Torsemide 20 mg daily at last hospitalization in 03/11/21  HPI     Home Medications Prior to Admission medications   Medication Sig Start Date End Date Taking? Authorizing Provider  albuterol (VENTOLIN HFA) 108 (90 Base) MCG/ACT inhaler Inhale 2 puffs into the lungs every 6 (six) hours as needed for wheezing or shortness of breath. 12/31/18   [provider]  aspirin EC 81 MG EC tablet Take 1 tablet (81 mg total) by mouth daily. Swallow whole. 12/07/20   Ezekiel Slocumb, DO  carvedilol (COREG) 6.25 MG tablet Take 1 tablet (6.25 mg total) by mouth 2 (two) times daily. 03/11/21   Shelly Coss, MD  cholecalciferol (VITAMIN D) 25 MCG tablet Take 1 tablet  (1,000 Units total) by mouth daily. 12/07/20   Ezekiel Slocumb, DO  DULoxetine (CYMBALTA) 30 MG capsule Take 30 mg by mouth at bedtime. 01/28/19 12/16/21  [provider]  DULoxetine (CYMBALTA) 30 MG capsule Take 1 capsule by mouth daily. 02/15/21   [provider]  erythromycin ophthalmic ointment Place one application into the right eye at bedtime. 03/25/20   [provider]  Evolocumab (REPATHA) 140 MG/ML SOSY Inject 140 mg into the skin See admin instructions. Inject 140 mg subcutaneously every other Sunday evening    [provider]  fenofibrate 160 MG tablet Take 1 tablet (160 mg total) by mouth daily. 12/07/20   Nicole Kindred A, DO  fluticasone furoate-vilanterol (BREO ELLIPTA) 100-25 MCG/INH AEPB Inhale 1 puff into the lungs daily as needed (shortness of breath). 01/21/19   [provider]  insulin regular human CONCENTRATED (HUMULIN R) 500 UNIT/ML KwikPen Patient inject 170 u before breakfast and 140 u before lunch and dinner. 01/28/21   [provider]  ivabradine (CORLANOR) 5 MG TABS tablet Take 1 tablet (5 mg total) by mouth 2 (two) times daily with a meal. 05/10/21   [name], Shaune Pascal, MD  levothyroxine (SYNTHROID) 125 MCG tablet Take 1 tablet (125 mcg total) by mouth daily at 6 (six) AM. 12/07/20   Ezekiel Slocumb, DO  lipase/protease/amylase (CREON) 36000 UNITS CPEP capsule Take 1 capsule (36,000 Units total) by mouth 3 (three) times daily with meals. Patient taking differently: Take 72,000  Units by mouth 3 (three) times daily with meals. Also take 1 capsule with snacks 12/06/20   Nicole Kindred A, DO  magnesium oxide (MAG-OX) 400 MG tablet Take by mouth. 06/24/20   [provider]  Multiple Vitamin (MULTIVITAMIN WITH MINERALS) TABS tablet Take 1 tablet by mouth daily. 12/07/20   Ezekiel Slocumb, DO  nitroGLYCERIN (NITROSTAT) 0.4 MG SL tablet Place 1 tablet (0.4 mg total) under the tongue every 5 (five) minutes as needed for  up to 25 days for chest pain. 08/01/19 01/06/22  [name], MD  nystatin-triamcinolone (MYCOLOG II) cream nystatin-triamcinolone 100,000 unit/g-0.1 % topical cream  APPLY CREAM TOPICALLY TO AFFECTED AREA ONCE DAILY    [provider]  ondansetron (ZOFRAN) 4 MG tablet Take 4 mg by mouth every 8 (eight) hours as needed for nausea or vomiting. 12/19/18   [provider]  oxyCODONE-acetaminophen (PERCOCET) 10-325 MG tablet Take 1 tablet by mouth every 6 (six) hours as needed for pain. Patient not taking: Reported on 03/10/2021 01/18/21   Molpus, John, MD  pantoprazole (PROTONIX) 40 MG tablet Take 40 mg by mouth daily. 12/24/19   [provider]  pregabalin (LYRICA) 100 MG capsule Take 200 mg by mouth in the morning. 10/09/20   [provider]  pregabalin (LYRICA) 100 MG capsule Take 1 capsule by mouth 2 (two) times daily.    [provider]  pregabalin (LYRICA) 300 MG capsule Take 300 mg by mouth at bedtime.    [provider]  prochlorperazine (COMPAZINE) 10 MG tablet Take 1 tablet (10 mg total) by mouth every 6 (six) hours as needed for nausea or vomiting. 01/18/21   Molpus, John, MD  REPATHA SURECLICK 482 MG/ML SOAJ Inject 1 Syringe into the skin every 14 (fourteen) days. 02/20/21   [provider]  sacubitril-valsartan (ENTRESTO) 24-26 MG Take 1 tablet by mouth daily.    [provider]  spironolactone (ALDACTONE) 25 MG tablet Patient takes 0.5 tablet daily.    [provider]  torsemide (DEMADEX) 20 MG tablet Take 20 mg by mouth daily.    [provider]  tretinoin (RETIN-A) 0.05 % cream Apply 1 application topically at bedtime. 01/17/21   [provider]      Allergies    Ketamine, Clarithromycin, Fentanyl, Maitake mushroom [maitake], Penicillin v, Shellfish allergy, Morphine, Penicillins, and Prednisone    Review of Systems   Review of Systems  Constitutional: Negative.   HENT: Negative.     Respiratory: Negative.    Cardiovascular:  Positive for leg swelling. Negative for chest pain and palpitations.  Gastrointestinal: Negative.   Genitourinary: Negative.   Musculoskeletal: Negative.   Skin: Negative.   Neurological: Negative.   All other systems reviewed and are negative.  Physical Exam Updated Vital Signs BP 120/66    Pulse (!) 102    Temp 98.2 F (36.8 C) (Oral)    Resp 18    Ht 4' 9"  (1.448 m)    Wt 63.5 kg    SpO2 92%    BMI 30.30 kg/m  Physical Exam Vitals and nursing note reviewed.  Constitutional:      General: [name] not in acute distress.    Appearance: [name] well-developed. [name] not ill-appearing, toxic-appearing or diaphoretic.  HENT:     Head: Normocephalic and atraumatic.     Nose: Nose normal.     Mouth/Throat:     Mouth: Mucous membranes are moist.  Eyes:     Pupils: Pupils are equal,  round, and reactive to light.  Cardiovascular:     Rate and Rhythm: Normal rate.     Pulses:          Radial pulses are 1+ on the right side and 1+ on the left side.       Dorsalis pedis pulses are 1+ on the right side and 1+ on the left side.     Heart sounds: Normal heart sounds.  Pulmonary:     Effort: Pulmonary effort is normal. No respiratory distress.     Breath sounds: Normal breath sounds.  Abdominal:     General: Bowel sounds are normal. There is no distension.     Palpations: Abdomen is soft.     Tenderness: There is no abdominal tenderness. There is no right CVA tenderness, left CVA tenderness or guarding.  Musculoskeletal:        General: Normal range of motion.     Cervical back: Normal range of motion.     Comments: No bony tenderness, 1+ pitting edema bilateral lower extremities  Skin:    General: Skin is warm and dry.     Capillary Refill: Capillary refill takes less than 2 seconds.  Neurological:     Mental Status: [name] alert and oriented to person, place, and time. Mental status is at baseline.  Psychiatric:        Mood and Affect:  Mood normal.   ED Results / Procedures / Treatments   Labs (all labs ordered are listed, but only abnormal results are displayed) Labs Reviewed  CBC WITH DIFFERENTIAL/PLATELET - Abnormal; Notable for the following components:      Result Value   RBC 3.57 (*)    Hemoglobin 10.3 (*)    HCT 31.1 (*)    RDW 16.0 (*)    All other components within normal limits  BASIC METABOLIC PANEL - Abnormal; Notable for the following components:   Sodium 129 (*)    Chloride 91 (*)    Glucose, Bld 199 (*)    BUN 110 (*)    Creatinine, Ser 3.56 (*)    GFR, Estimated 17 (*)    Anion gap 16 (*)    All other components within normal limits  URINALYSIS, ROUTINE W REFLEX MICROSCOPIC - Abnormal; Notable for the following components:   Glucose, UA 100 (*)    All other components within normal limits  RESP PANEL BY RT-PCR (FLU A&B, COVID) ARPGX2  MAGNESIUM  BRAIN NATRIURETIC PEPTIDE  PREGNANCY, URINE  I-STAT BETA HCG BLOOD, ED ([facility], [facility], [facility] ONLY)    EKG EKG Interpretation  Date/Time:  Thursday May 13 2021 17:28:13 EST Ventricular Rate:  101 PR Interval:  120 QRS Duration: 94 QT Interval:  351 QTC Calculation: 455 R Axis:   24 Text Interpretation: Sinus tachycardia Probable left ventricular hypertrophy Confirmed by Lacretia Leigh ([id number]) on 05/13/2021 6:07:36 PM  Radiology DG Chest 2 View  Result Date: 05/13/2021 CLINICAL DATA:  Shortness of breath EXAM: CHEST - 2 VIEW COMPARISON:  Chest x-ray 03/29/2021 FINDINGS: Heart size and mediastinal contours are within normal limits. No suspicious pulmonary opacities identified. No pleural effusion or pneumothorax visualized. No acute osseous abnormality appreciated. IMPRESSION: No acute intrathoracic process identified. Electronically Signed   By: Ofilia Neas M.D.   On: 05/13/2021 18:43   US Renal  Result Date: 05/13/2021 CLINICAL DATA:  Acute kidney injury EXAM: RENAL / URINARY TRACT ULTRASOUND COMPLETE COMPARISON:  CT abdomen/pelvis dated  03/05/2021 FINDINGS: Right Kidney: Renal measurements: 9.7 x 5.3 x  3.9 cm = volume: 107 mL. Echogenicity within normal limits. No mass or hydronephrosis visualized. Left Kidney: Renal measurements: 11.0 x 4.9 x 4.8 cm = volume: 140 mL. Echogenicity within normal limits. No mass or hydronephrosis visualized. Bladder: Appears normal for degree of bladder distention. Other: None. IMPRESSION: Negative renal ultrasound.  No hydronephrosis. Electronically Signed   By: Julian Hy M.D.   On: 05/13/2021 19:41    Procedures .Critical Care Performed by: Nettie Elm, PA-C Authorized by: Nettie Elm, PA-C   Critical care provider statement:    Critical care time (minutes):  35   Critical care was necessary to treat or prevent imminent or life-threatening deterioration of the following conditions:  Dehydration and renal failure   Critical care was time spent personally by me on the following activities:  Development of treatment plan with patient or surrogate, discussions with consultants, evaluation of patient's response to treatment, examination of patient, ordering and review of laboratory studies, ordering and review of radiographic studies, ordering and performing treatments and interventions, pulse oximetry, re-evaluation of patient's condition and review of old charts    Medications Ordered in ED Medications  0.9 %  sodium chloride infusion (has no administration in time range)  sodium chloride 0.9 % bolus 500 mL (500 mLs Intravenous New Bag/Given 05/13/21 2002)    ED Course/ Medical Decision Making/ A&P    31 year old with multiple medical comorbidities including diabetes, CKD, CHF, chronic extremity weakness due to prior brain surgery here for evaluation of abnormal lab.  Had lab work performed prior to EGD which showed new renal failure and hyperkalemia.  Patient does endorse that she has been taking more torsemide due to her lower extremity swelling over the last 2 weeks.   Taking total of 120 mg torsemide daily ( Chart review>She does have some edema to her lower extremities however nontender, compartments soft.  Her heart and lungs are clear.  Has some chronic unilateral weakness.  States she is urinating without difficulty  Labs and imaging personally reviewed and interpreted:  CBC without leukocytosis, hemoglobin 63.8 Metabolic panel hyponatremia 129, chronic, glucose 199, creatinine 3.56 baseline 1.5, BUN 110 denies melena BNP 12.0 UA neg for infection Preg neg COVID/Flu neg Chest xray without acute edema US renal no hydronephrosis EKG without ischemic changes  Patient reassessed.  Discussed her labs and imaging.  Recommend admission for acute renal failure which I suspect is due to overmedication of her torsemide.  She has no evidence of obstructive process on exam.  [name] not retaining.  Ultrasound reassuring without any evidence of hydronephrosis.  Urine does not appear infected.  Will admit to hospitalist for further management.  Patient agreeable.  Will give small fluid bolus however due to EF at 45% would not want to significantly fluid overload within small time period.  Family and patient agreeable for admission  CONSULT with Dr. Alcario Drought with [facility] who agrees to accept patient in transfer for admission.  The patient appears reasonably stabilized for admission considering the current resources, flow, and capabilities available in the ED at this time, and I doubt any other [facility] requiring further screening and/or treatment in the ED prior to admission.                           Medical Decision Making Amount and/or Complexity of Data Reviewed Independent Historian: spouse External Data Reviewed: labs, radiology, ECG and notes.    Details: Last EF 45% 2022 Labs: ordered.  Decision-making details documented in ED Course. Radiology: ordered and independent interpretation performed. Decision-making details documented in ED Course. ECG/medicine tests:  ordered and independent interpretation performed. Decision-making details documented in ED Course.  Risk OTC drugs. Prescription drug management. Decision regarding hospitalization. Diagnosis or treatment significantly limited by social determinants of health.         Final Clinical Impression(s) / ED Diagnoses Final diagnoses:  Acute renal failure, unspecified acute renal failure type ([place])  History of CHF (congestive heart failure)  Hyponatremia  Elevated BUN  Spastic hemiparesis (HCC)  Chronic tachycardia    Rx / DC Orders ED Discharge Orders     None         Chirsty Armistead A, PA-C 05/13/21 2115

## 2021-05-13 NOTE — Telephone Encounter (Signed)
Tried calling patient back to advise her to go straight to the Emergency Room. No answer and unable to leave message.

## 2021-05-13 NOTE — Plan of Care (Signed)
31 yo F with h/o CHF, follows with Dr. Jacinto Reap.  Looks like CKD 3 at baseline.  Increasing swelling, so she took herself up from 20 BID of torsemide to 60BID over past couple of weeks!  Sent in to ED today for K reported of 7.8.  In ED K is only 5.0; however, pt does have AKF with creat of 3.x and a BUN of 110!  Sounds like too much torsemide and pre-renal.  Sending pt to [facility], tele, IP.  [facility] will assume care on arrival to accepting facility. Until arrival, care as per EDP. However, [facility] available 24/7 for questions and assistance.  Nursing staff, please page [place] and Consults [phone]) as soon as the patient arrives to the hospital.

## 2021-05-14 LAB — CBG MONITORING, ED: Glucose-Capillary: 325 mg/dL — ABNORMAL HIGH (ref 70–99)

## 2021-05-14 MED ORDER — INSULIN ASPART 100 UNIT/ML IJ SOLN: 0.0000 [IU] | Freq: Three times a day (TID) | INTRAMUSCULAR | Status: DC

## 2021-05-14 MED ORDER — CARVEDILOL 6.25 MG PO TABS: 6.2500 mg | ORAL_TABLET | Freq: Two times a day (BID) | ORAL | Status: DC

## 2021-05-14 MED ORDER — PREGABALIN 75 MG PO CAPS
200.0000 mg | ORAL_CAPSULE | Freq: Every morning | ORAL | Status: DC
  Administered 2021-05-14: 200 mg via ORAL
  Filled 2021-05-14: qty 2

## 2021-05-14 MED ORDER — LEVOTHYROXINE SODIUM 25 MCG PO TABS
125.0000 ug | ORAL_TABLET | Freq: Every day | ORAL | Status: DC
  Administered 2021-05-14: 125 ug via ORAL
  Filled 2021-05-14: qty 1

## 2021-05-14 MED ORDER — INSULIN ASPART 100 UNIT/ML IJ SOLN: 0.0000 [IU] | Freq: Every day | INTRAMUSCULAR | Status: DC

## 2021-05-14 MED ORDER — ASPIRIN EC 81 MG PO TBEC: 81.0000 mg | DELAYED_RELEASE_TABLET | Freq: Every day | ORAL | Status: DC

## 2021-05-14 MED ORDER — PANTOPRAZOLE SODIUM 40 MG PO TBEC: 40.0000 mg | DELAYED_RELEASE_TABLET | Freq: Every day | ORAL | Status: DC

## 2021-05-14 NOTE — ED Provider Notes (Signed)
7:59 AM Called to bedside as patient does not want to stay in the ED any longer. I discussed with the patient regarding her results from today and the rationale for decision for admission. She does not feel that she needs to stay in the hospital and reports that she has an appointment with a nephrologist in the next week to discuss her kidney issues. Discussed that we were concerned regarding her ARF a/w recent torsemide use, electrolyte abnormalities. She reports that she is feeling better and does not want to stay. I again discussed why admission was recommended but she is adamant that she is ready to leave and does not want to be admitted.   The patient has requested to leave the ED against medical advice. I believe this patient is of sound mind and medical decision making capacity to refuse medical care. The patient is responding and asking questions appropriately. The patient is oriented to person, place and time. The patient is not psychotic, delusional, suicidal, homicidal or hallucinating. The patient demonstrates a normal mental capacity to make decisions regarding their healthcare. The patient is clinically sober and does not appear to be under the influence of any illicit drugs at this time. The patient has been advised of the risks, in layman terms, of leaving AMA which include, but are not limited to death, coma, permanent disability, loss of current lifestyle, delay in diagnosis. Alternatives have been offered - the patient remains steadfast in their wish to leave. The patient has been advised that should they change their mind they are welcome to return to this hospital, or any other, at any time. The patient understands that in no way does an [place] discharge mean that I do not want them to have the best medical care available. To this end, I have offered appropriate prescriptions, referrals, and discharge instructions. The patient did sign AMA paperwork. The above discussion was witnessed by another  member of staff.    Wynona Dove A, DO 05/14/21 0800

## 2021-05-14 NOTE — ED Notes (Signed)
Pt alert, oriented and ambulatory. Pt stated that she has an appointment soon and she does not want to stay. Consulted with MD and Pharm beforehand.

## 2021-05-14 NOTE — Discharge Instructions (Signed)
It was a pleasure caring for you today in the emergency department.  Please return to the emergency department or call 911 for any worsening or worrisome symptoms or if you would like to complete your care that was initiated in the ED today.

## 2021-05-21 ENCOUNTER — Emergency Department (HOSPITAL_BASED_OUTPATIENT_CLINIC_OR_DEPARTMENT_OTHER)
Admission: EM | Admit: 2021-05-21 | Discharge: 2021-05-21 | Disposition: A | Discharge status: Home / Self Care | Payer: No Typology Code available for payment source | Attending: Emergency Medicine | Admitting: Emergency Medicine

## 2021-05-21 ENCOUNTER — Emergency Department (HOSPITAL_BASED_OUTPATIENT_CLINIC_OR_DEPARTMENT_OTHER): Payer: No Typology Code available for payment source

## 2021-05-21 ENCOUNTER — Emergency Department (HOSPITAL_BASED_OUTPATIENT_CLINIC_OR_DEPARTMENT_OTHER): Payer: No Typology Code available for payment source | Admitting: Radiology

## 2021-05-21 ENCOUNTER — Encounter (HOSPITAL_BASED_OUTPATIENT_CLINIC_OR_DEPARTMENT_OTHER): Payer: Self-pay | Admitting: *Deleted

## 2021-05-21 ENCOUNTER — Other Ambulatory Visit: Payer: Self-pay

## 2021-05-21 DIAGNOSIS — E119 Type 2 diabetes mellitus without complications: Secondary | ICD-10-CM | POA: Diagnosis not present

## 2021-05-21 DIAGNOSIS — E039 Hypothyroidism, unspecified: Secondary | ICD-10-CM | POA: Insufficient documentation

## 2021-05-21 DIAGNOSIS — Z794 Long term (current) use of insulin: Secondary | ICD-10-CM | POA: Insufficient documentation

## 2021-05-21 DIAGNOSIS — Z79899 Other long term (current) drug therapy: Secondary | ICD-10-CM | POA: Insufficient documentation

## 2021-05-21 DIAGNOSIS — Y92410 Unspecified street and highway as the place of occurrence of the external cause: Secondary | ICD-10-CM | POA: Diagnosis not present

## 2021-05-21 DIAGNOSIS — I10 Essential (primary) hypertension: Secondary | ICD-10-CM | POA: Insufficient documentation

## 2021-05-21 DIAGNOSIS — Z7982 Long term (current) use of aspirin: Secondary | ICD-10-CM | POA: Insufficient documentation

## 2021-05-21 DIAGNOSIS — M545 Low back pain, unspecified: Secondary | ICD-10-CM | POA: Insufficient documentation

## 2021-05-21 DIAGNOSIS — M542 Cervicalgia: Secondary | ICD-10-CM | POA: Insufficient documentation

## 2021-05-21 DIAGNOSIS — M25512 Pain in left shoulder: Secondary | ICD-10-CM | POA: Diagnosis not present

## 2021-05-21 DIAGNOSIS — S0990XA Unspecified injury of head, initial encounter: Secondary | ICD-10-CM | POA: Insufficient documentation

## 2021-05-21 LAB — PREGNANCY, URINE: Preg Test, Ur: NEGATIVE

## 2021-05-21 NOTE — ED Notes (Signed)
Pt was provided with a warm blanket and water.
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
## 2021-05-21 NOTE — Discharge Instructions (Signed)
Your imaging was normal and reassuring.  I would like for you to follow-up with your primary care doctor for further evaluation.  You will be sore in the coming days.  You can take 600 mg of ibuprofen every 6 hours for pain control.  Please return to the emergency department for worsening symptoms.

## 2021-05-21 NOTE — ED Notes (Signed)
Patient given urine cup for sample.

## 2021-05-21 NOTE — ED Notes (Signed)
EMT-P provided AVS using Teachback Method. Patient verbalizes understanding of Discharge Instructions. Opportunity for Questioning and Answers were provided by EMT-P. Patient Discharged from ED.  ? ?

## 2021-06-06 ENCOUNTER — Encounter (HOSPITAL_BASED_OUTPATIENT_CLINIC_OR_DEPARTMENT_OTHER): Payer: Self-pay | Admitting: *Deleted

## 2021-06-06 ENCOUNTER — Emergency Department (HOSPITAL_BASED_OUTPATIENT_CLINIC_OR_DEPARTMENT_OTHER)
Admission: EM | Admit: 2021-06-06 | Discharge: 2021-06-06 | Disposition: A | Discharge status: Home / Self Care | Payer: BC Managed Care – PPO | Attending: Emergency Medicine | Admitting: Emergency Medicine

## 2021-06-06 ENCOUNTER — Other Ambulatory Visit: Payer: Self-pay

## 2021-06-06 DIAGNOSIS — E871 Hypo-osmolality and hyponatremia: Secondary | ICD-10-CM | POA: Insufficient documentation

## 2021-06-06 DIAGNOSIS — D649 Anemia, unspecified: Secondary | ICD-10-CM | POA: Insufficient documentation

## 2021-06-06 DIAGNOSIS — Z7982 Long term (current) use of aspirin: Secondary | ICD-10-CM | POA: Diagnosis not present

## 2021-06-06 DIAGNOSIS — R1013 Epigastric pain: Secondary | ICD-10-CM | POA: Insufficient documentation

## 2021-06-06 DIAGNOSIS — R000 Tachycardia, unspecified: Secondary | ICD-10-CM | POA: Insufficient documentation

## 2021-06-06 DIAGNOSIS — R1084 Generalized abdominal pain: Secondary | ICD-10-CM

## 2021-06-06 DIAGNOSIS — Z794 Long term (current) use of insulin: Secondary | ICD-10-CM | POA: Insufficient documentation

## 2021-06-06 DIAGNOSIS — Z79899 Other long term (current) drug therapy: Secondary | ICD-10-CM | POA: Insufficient documentation

## 2021-06-06 DIAGNOSIS — E1165 Type 2 diabetes mellitus with hyperglycemia: Secondary | ICD-10-CM | POA: Diagnosis not present

## 2021-06-06 DIAGNOSIS — R739 Hyperglycemia, unspecified: Secondary | ICD-10-CM

## 2021-06-06 LAB — URINALYSIS, ROUTINE W REFLEX MICROSCOPIC
Bilirubin Urine: NEGATIVE
Glucose, UA: >1000 mg/dL — AB
Hgb urine dipstick: NEGATIVE
Ketones, ur: NEGATIVE mg/dL
Leukocytes,Ua: NEGATIVE
Nitrite: NEGATIVE
Specific Gravity, Urine: 1.014 (ref 1.005–1.030)
pH: 6 (ref 5.0–8.0)

## 2021-06-06 LAB — LIPID PANEL
Cholesterol: 252 mg/dL — ABNORMAL HIGH (ref 0–200)
HDL: 28 mg/dL — ABNORMAL LOW (ref >40)
LDL Cholesterol: UNDETERMINED mg/dL (ref 0–99)
Total CHOL/HDL Ratio: 9 RATIO
Triglycerides: 980 mg/dL — ABNORMAL HIGH (ref <150)
VLDL: UNDETERMINED mg/dL (ref 0–40)

## 2021-06-06 LAB — CBG MONITORING, ED
Glucose-Capillary: 401 mg/dL — ABNORMAL HIGH (ref 70–99)
Glucose-Capillary: 368 mg/dL — ABNORMAL HIGH (ref 70–99)

## 2021-06-06 LAB — COMPREHENSIVE METABOLIC PANEL
ALT: 58 U/L — ABNORMAL HIGH (ref 0–44)
AST: 29 U/L (ref 15–41)
Albumin: 4.5 g/dL (ref 3.5–5.0)
Alkaline Phosphatase: 82 U/L (ref 38–126)
Anion gap: 13 (ref 5–15)
BUN: 23 mg/dL — ABNORMAL HIGH (ref 6–20)
CO2: 21 mmol/L — ABNORMAL LOW (ref 22–32)
Calcium: 9.9 mg/dL (ref 8.9–10.3)
Chloride: 94 mmol/L — ABNORMAL LOW (ref 98–111)
Creatinine, Ser: 1.78 mg/dL — ABNORMAL HIGH (ref 0.44–1.00)
GFR, Estimated: 39 mL/min — ABNORMAL LOW (ref >60)
Glucose, Bld: 465 mg/dL — ABNORMAL HIGH (ref 70–99)
Potassium: 4.7 mmol/L (ref 3.5–5.1)
Sodium: 128 mmol/L — ABNORMAL LOW (ref 135–145)
Total Bilirubin: 0.8 mg/dL (ref 0.3–1.2)
Total Protein: 8.6 g/dL — ABNORMAL HIGH (ref 6.5–8.1)

## 2021-06-06 LAB — CBC WITH DIFFERENTIAL/PLATELET
Abs Immature Granulocytes: 0.09 10*3/uL — ABNORMAL HIGH (ref 0.00–0.07)
Basophils Absolute: 0 10*3/uL (ref 0.0–0.1)
Basophils Relative: 0 %
Eosinophils Absolute: 0.1 10*3/uL (ref 0.0–0.5)
Eosinophils Relative: 1 %
HCT: 32.2 % — ABNORMAL LOW (ref 36.0–46.0)
Hemoglobin: 10.5 g/dL — ABNORMAL LOW (ref 12.0–15.0)
Immature Granulocytes: 1 %
Lymphocytes Relative: 13 %
Lymphs Abs: 1 10*3/uL (ref 0.7–4.0)
MCH: 28.6 pg (ref 26.0–34.0)
MCHC: 32.6 g/dL (ref 30.0–36.0)
MCV: 87.7 fL (ref 80.0–100.0)
Monocytes Absolute: 0.9 10*3/uL (ref 0.1–1.0)
Monocytes Relative: 12 %
Neutro Abs: 5.5 10*3/uL (ref 1.7–7.7)
Neutrophils Relative %: 73 %
Platelets: 216 10*3/uL (ref 150–400)
RBC: 3.67 MIL/uL — ABNORMAL LOW (ref 3.87–5.11)
RDW: 15.7 % — ABNORMAL HIGH (ref 11.5–15.5)
WBC: 7.7 10*3/uL (ref 4.0–10.5)
nRBC: 0 % (ref 0.0–0.2)

## 2021-06-06 LAB — PREGNANCY, URINE: Preg Test, Ur: NEGATIVE

## 2021-06-06 LAB — LIPASE, BLOOD: Lipase: 23 U/L (ref 11–51)

## 2021-06-06 LAB — LDL CHOLESTEROL, DIRECT: Direct LDL: 86.4 mg/dL (ref 0–99)

## 2021-06-06 MED ORDER — INSULIN ASPART 100 UNIT/ML IJ SOLN
25.0000 [IU] | Freq: Once | INTRAMUSCULAR | Status: AC
  Administered 2021-06-06: 25 [IU] via SUBCUTANEOUS

## 2021-06-06 MED ORDER — LACTATED RINGERS IV BOLUS
1000.0000 mL | Freq: Once | INTRAVENOUS | Status: AC
  Administered 2021-06-06: 1000 mL via INTRAVENOUS

## 2021-06-06 MED ORDER — PROMETHAZINE HCL 25 MG PO TABS
25.0000 mg | ORAL_TABLET | Freq: Once | ORAL | Status: AC
  Administered 2021-06-06: 25 mg via ORAL
  Filled 2021-06-06: qty 1

## 2021-06-06 MED ORDER — ONDANSETRON HCL 4 MG/2ML IJ SOLN
4.0000 mg | Freq: Once | INTRAMUSCULAR | Status: AC
  Administered 2021-06-06: 4 mg via INTRAVENOUS
  Filled 2021-06-06: qty 2

## 2021-06-06 MED ORDER — HYDROMORPHONE HCL 1 MG/ML IJ SOLN
1.0000 mg | Freq: Once | INTRAMUSCULAR | Status: AC
  Administered 2021-06-06: 1 mg via INTRAVENOUS
  Filled 2021-06-06: qty 1

## 2021-06-06 MED ORDER — INSULIN REGULAR HUMAN 100 UNIT/ML IJ SOLN: 10.0000 [IU] | Freq: Once | INTRAMUSCULAR | Status: DC

## 2021-06-06 NOTE — ED Provider Notes (Signed)
Patient signed out to me at 7 AM.  Awaiting triglyceride levels.  Baseline around 900.  History of high triglycerides and hyperglycemia.  Lipase is normal.  Exam is benign.  No concern for pancreatitis at this time.  Blood sugar improving in the 300s after insulin given here.  Patient not in DKA. ? ?Triglyceride level around 980.  Recent level was in the 900s as well.  She appears comfortable.  No concern for pancreatitis.  Blood sugar improving.  Recommend follow-up primary care doctor.  Discharge. ?  Lennice Sites, DO ?06/06/21 9136 ? ?

## 2021-06-06 NOTE — ED Provider Notes (Signed)
[facility]  Provider Note  CSN: [id number] Arrival date & time: 06/06/21 0115  History Chief Complaint  Patient presents with   Abdominal Pain    Jennifer [name] is a 31 y.o. female with complex medical history including diabetes and hypertriglyceridemia recently admitted at [facility] for elevated triglycerides and abdominal pain. Discharged 3/10 when levels had improved to <1000, lipase was not elevated. She was told at discharge she did not need antibiotics for some sinus symptoms she was having but 3/11, she went to a walk in clinic and was given Rx for Keflex (she thinks) and since taking it, she has had increased epigastric cramping with some nausea and vomiting. No fever. No diarrhea.    Home Medications Prior to Admission medications   Medication Sig Start Date End Date Taking? Authorizing Provider  albuterol (VENTOLIN HFA) 108 (90 Base) MCG/ACT inhaler Inhale 2 puffs into the lungs every 6 (six) hours as needed for wheezing or shortness of breath. 12/31/18   [provider]  aspirin EC 81 MG EC tablet Take 1 tablet (81 mg total) by mouth daily. Swallow whole. 12/07/20   Ezekiel Slocumb, DO  carvedilol (COREG) 6.25 MG tablet Take 1 tablet (6.25 mg total) by mouth 2 (two) times daily. 03/11/21   Shelly Coss, MD  cholecalciferol (VITAMIN D) 25 MCG tablet Take 1 tablet (1,000 Units total) by mouth daily. 12/07/20   Ezekiel Slocumb, DO  DULoxetine (CYMBALTA) 30 MG capsule Take 30 mg by mouth at bedtime. 01/28/19 12/16/21  [provider]  DULoxetine (CYMBALTA) 30 MG capsule Take 1 capsule by mouth daily. 02/15/21   [provider]  erythromycin ophthalmic ointment Place one application into the right eye at bedtime. 03/25/20   [provider]  Evolocumab (REPATHA) 140 MG/ML SOSY Inject 140 mg into the skin See admin instructions. Inject 140 mg subcutaneously every other Sunday evening    [provider]   fenofibrate 160 MG tablet Take 1 tablet (160 mg total) by mouth daily. 12/07/20   Nicole Kindred A, DO  fluticasone furoate-vilanterol (BREO ELLIPTA) 100-25 MCG/INH AEPB Inhale 1 puff into the lungs daily as needed (shortness of breath). 01/21/19   [provider]  insulin regular human CONCENTRATED (HUMULIN R) 500 UNIT/ML KwikPen Patient inject 100u before breakfast and 50u before lunch and dinner. 01/28/21   [provider]  ivabradine (CORLANOR) 5 MG TABS tablet Take 1 tablet (5 mg total) by mouth 2 (two) times daily with a meal. 05/10/21   [name], Shaune Pascal, MD  levothyroxine (SYNTHROID) 125 MCG tablet Take 1 tablet (125 mcg total) by mouth daily at 6 (six) AM. 12/07/20   Ezekiel Slocumb, DO  lipase/protease/amylase (CREON) 36000 UNITS CPEP capsule Take 1 capsule (36,000 Units total) by mouth 3 (three) times daily with meals. Patient taking differently: Take 72,000 Units by mouth 3 (three) times daily with meals. Also take 1 capsule with snacks 12/06/20   Nicole Kindred A, DO  magnesium oxide (MAG-OX) 400 MG tablet Take by mouth. 06/24/20   [provider]  Multiple Vitamin (MULTIVITAMIN WITH MINERALS) TABS tablet Take 1 tablet by mouth daily. 12/07/20   Ezekiel Slocumb, DO  nitroGLYCERIN (NITROSTAT) 0.4 MG SL tablet Place 1 tablet (0.4 mg total) under the tongue every 5 (five) minutes as needed for up to 25 days for chest pain. 08/01/19 01/06/22  [name], MD  nystatin-triamcinolone (MYCOLOG II) cream nystatin-triamcinolone 100,000 unit/g-0.1 % topical cream  APPLY CREAM TOPICALLY TO AFFECTED  AREA ONCE DAILY    [provider]  ondansetron (ZOFRAN) 4 MG tablet Take 4 mg by mouth every 8 (eight) hours as needed for nausea or vomiting. 12/19/18   [provider]  oxyCODONE-acetaminophen (PERCOCET) 10-325 MG tablet Take 1 tablet by mouth every 6 (six) hours as needed for pain. Patient not taking: Reported on 03/10/2021 01/18/21   Molpus, John, MD   pantoprazole (PROTONIX) 40 MG tablet Take 40 mg by mouth daily. 12/24/19   [provider]  pregabalin (LYRICA) 100 MG capsule Take 200 mg by mouth in the morning. 10/09/20   [provider]  pregabalin (LYRICA) 100 MG capsule Take 1 capsule by mouth 2 (two) times daily.    [provider]  pregabalin (LYRICA) 300 MG capsule Take 300 mg by mouth at bedtime.    [provider]  prochlorperazine (COMPAZINE) 10 MG tablet Take 1 tablet (10 mg total) by mouth every 6 (six) hours as needed for nausea or vomiting. 01/18/21   Molpus, John, MD  REPATHA SURECLICK 314 MG/ML SOAJ Inject 1 Syringe into the skin every 14 (fourteen) days. 02/20/21   [provider]  sacubitril-valsartan (ENTRESTO) 24-26 MG Take 1 tablet by mouth daily.    [provider]  spironolactone (ALDACTONE) 25 MG tablet Patient takes 0.5 tablet daily.    [provider]  torsemide (DEMADEX) 20 MG tablet Take 20 mg by mouth daily.    [provider]  tretinoin (RETIN-A) 0.05 % cream Apply 1 application topically at bedtime. 01/17/21   [provider]     Allergies    Ketamine, Clarithromycin, Fentanyl, Maitake mushroom [maitake], Penicillin v, Shellfish allergy, Morphine, Penicillins, and Prednisone   Review of Systems   Review of Systems Please see HPI for pertinent positives and negatives  Physical Exam BP 139/85    Pulse (!) 108    Temp 97.8 F (36.6 C) (Oral)    Resp (!) 21    Ht 4' 9"  (1.448 m)    Wt 63.5 kg    LMP 05/16/2021 (Exact Date)    SpO2 94%    BMI 30.30 kg/m   Physical Exam Vitals and nursing note reviewed.  Constitutional:      Appearance: Normal appearance.  HENT:     Head: Normocephalic and atraumatic.     Nose: Nose normal.     Mouth/Throat:     Mouth: Mucous membranes are moist.  Eyes:     Extraocular Movements: Extraocular movements intact.     Conjunctiva/sclera: Conjunctivae normal.  Cardiovascular:     Rate and  Rhythm: Tachycardia present.  Pulmonary:     Effort: Pulmonary effort is normal.     Breath sounds: Normal breath sounds.  Abdominal:     General: Abdomen is flat.     Palpations: Abdomen is soft.     Tenderness: There is abdominal tenderness in the epigastric area. There is no guarding. Negative signs include Murphy's sign and McBurney's sign.  Musculoskeletal:        General: No swelling. Normal range of motion.     Cervical back: Neck supple.  Skin:    General: Skin is warm and dry.  Neurological:     General: No focal deficit present.     Mental Status: [name] alert.  Psychiatric:        Mood and Affect: Mood normal.    ED Results / Procedures / Treatments   EKG EKG Interpretation  Date/Time:  Sunday June 06 2021 01:43:31  EST Ventricular Rate:  106 PR Interval:  117 QRS Duration: 90 QT Interval:  322 QTC Calculation: 428 R Axis:   18 Text Interpretation: Sinus tachycardia Nonspecific repol abnormality, lateral leads No significant change since last tracing Confirmed by Calvert Cantor [phone]) on 06/06/2021 1:46:20 AM  Procedures Procedures  Medications Ordered in the ED Medications  HYDROmorphone (DILAUDID) injection 1 mg (1 mg Intravenous Given 06/06/21 0340)  ondansetron (ZOFRAN) injection 4 mg (4 mg Intravenous Given 06/06/21 0339)  lactated ringers bolus 1,000 mL (0 mLs Intravenous Stopped 06/06/21 0441)  promethazine (PHENERGAN) tablet 25 mg (25 mg Oral Given 06/06/21 0447)  insulin aspart (novoLOG) injection 25 Units (25 Units Subcutaneous Given 06/06/21 0516)    Initial Impression and Plan  Patient with known hypertriglyceridemia, recently admitted for same, now here with return of epigastric pain and vomiting after starting Abx for sinus symptoms. Will check labs including lipid panel, patient advised this lab is not run here and will need to be sent out to [facility] so there may be additional delays. Pain and nausea meds for comfort.   ED Course   Clinical Course  as of 06/06/21 0704  Nancy Fetter Jun 06, 2021  0321 CBC with mild anemia, otherwise unremarkable.  [CS]  [phone] CMP with CKD about at baseline, improved from recent ED visit with AKI. There is also hyponatremia similar to previous, corrected to 134 due to hyperglycemia. Normal anion gap, no signs of DKA.  [CS]  0341 Lipase is normal.  [CS]  4888 CBG is improving. Will give additional insulin while awaiting lipid panel. She has severe insulin resistance and had her insulin decreased from 210U to 100U down to 50U which she took yesterday evening. Will give an additional 50Units now.  [CS]  [phone] Spoke with pharmacy who recommends 25Units due to the difference in pharmacokinetics between her usual U-500 and our U-100 concentrations.  [CS]  0530 UA with glucosuria but no infection.  [CS]  0624 CBG continues to improve.  [CS]  0702 Care will be signed out to Dr. Dondra Spry at the change of shift.   [CS]    Clinical Course User Index [CS] Truddie Hidden, MD     MDM Rules/Calculators/A&P Medical Decision Making Amount and/or Complexity of Data Reviewed Labs: ordered.  Risk Prescription drug management.    Final Clinical Impression(s) / ED Diagnoses Final diagnoses:  None    Rx / DC Orders ED Discharge Orders     None        Truddie Hidden, MD 06/06/21 [phone]

## 2021-06-06 NOTE — ED Triage Notes (Signed)
Pt upper abd pain that started at 2200 last night after she took her antibiotic. C/o nausea and vomiting times 4. Took zofran but states she "threw it up" also took phenergan around 2330 but states she threw that medication up as well. States recent admission at [facility] for pancreatitis. Denies fevers.  ?

## 2021-06-06 NOTE — ED Notes (Signed)
Attempted IV access without success second RN to attempt  ?

## 2021-06-06 NOTE — ED Notes (Signed)
Discussed pending lipid panel with lab per EDP Karle Starch. Per lab typically not a lab sent stat via carrier to main lab for resulting. EDP requesting lab be sent stat. Lab tech [name] to call carrier now requesting delivery of lab for results. Unknown ETA for carrier lab to update RN.  ?

## 2021-06-09 ENCOUNTER — Encounter (HOSPITAL_COMMUNITY): Payer: Self-pay | Admitting: Internal Medicine

## 2021-06-16 ENCOUNTER — Other Ambulatory Visit (HOSPITAL_COMMUNITY): Payer: Self-pay | Admitting: *Deleted

## 2021-06-16 ENCOUNTER — Encounter (HOSPITAL_COMMUNITY): Payer: Self-pay | Admitting: Internal Medicine

## 2021-06-16 DIAGNOSIS — R0789 Other chest pain: Secondary | ICD-10-CM

## 2021-06-16 MED ORDER — NITROGLYCERIN 0.4 MG SL SUBL: 0.4000 mg | SUBLINGUAL_TABLET | SUBLINGUAL | 3 refills | Status: DC | PRN

## 2021-06-16 MED ORDER — SPIRONOLACTONE 25 MG PO TABS: 12.5000 mg | ORAL_TABLET | Freq: Every day | ORAL | 3 refills | Status: DC

## 2021-08-01 NOTE — H&P (View-Only) (Signed)
?Advanced Heart Failure Clinic Note  ? ? ?PCP: Sue Lush, PA-C ?PCP-Cardiologist: Dr. Einar Gip ?HF Cardiologist: Dr. Haroldine Laws ? ?HPI: ?Jennifer [name] is a 31 y.o. 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 ?  ?Has been followed by Dr. Einar Gip for her HF.  ?  ?Admitted 11/22/20 with ab pain and diarrhea. Ab CT concerning for vasculitis (no pancreatitis). Her TG were found to be  4030. She was started on an insulin/dextrose infusion. Cardiology and Nephrology were consulted after her creatinine and LFTs continued to rise and she began developing hypotension. Echo 11/28/20 EF was found to be 20-25% with moderate RV dysfunction and mild septal flattening. Started on DBA 2.5. AHF consulted for further management. PICC placed to follow CVP and Co-Ox. BP were high and milrinone added to augment diuresis. cMRI c/w prior myocarditis vs sarcoid (see below). Mod pericardial effusion also noted. LVEF 29%. RV normal. She underwent R/LHC showing single vessel RCA occlusion, RA 1, LVEDP 17, Fick CO 5.6/CI 3.88. She was able to be weaned off inotropes and GDMT titrated.   ? ?Post hospital follow up she was drinking >2L/day fluid and eating more salty foods, volume was up, ReDs 43%, instructed to increase lasix to 20 mg bid x 3 days. She followed up with [name] with [facility] 12/17/20 for a 2nd opinion. He placed LifeVest and switched beta blocker to Toprol XL. ? ?Called after hours service with swelling, instructed to continue lasix 20 mg bid. ? ?Seen in ED 12/25/20 for fall. She was standing at work and became dizzy. She fell backwards and hit her head. CT neg, labs showed K 5.7. She was given 1 dose of Lokelma. She denied palpitations. ? ?Volume overloaded at follow up 01/06/21, lasix switched to torsemide. She had a LifeVest per Cardiology at  [facility]. ? ?Echo (10/22): EF up to 45-50%, grade II DD, RV ok. LifeVest off. ? ?Treated in ED 10/22 for pancreatitis, then admitted 11/22 for hyperglycemia, did not meet criteria for DKA.  Hospitalization c/b hyponatremia, sodium 118. Concern for volume overload. Lasix restarted but SCr increased. Nephrology consulted and restarted gentle IVF and GDMT held for a couple of days. Recommendation for Tolvaptan, however patient declined and requested discharge with outpatient follow-up her MD at [facility]. Jardiance stopped at discharge, weight 129 lbs.  ? ?Seen back in ED a few days later with increased weight and abd distention. Given IV lasix in ED and torsemide increased to 40 bid. ? ?At last visit 11/22, her breathing had improved but she complained of abdominal swelling/bloating, not felt to be related to volume overload. Concern for possible gastroparesis. She was referred to GI. They recommended abdominal CT. This was completed 12/9 and was unremarkable. Ceruloplasmin level was also low, raising concern for possible Wilson's Disease. However had further w/u w/ [facility] and reports Wilson's disease was ruled out. Additional GI testing unremarkable.  ? ?Echo 12/22 EF 45%, RV normal.  ? ?She presents today for routine f/u. Reports increased gradual wt gain/ abdominal distention/bloating + palpitations and exertional dyspnea. NYHA Class II-III. No orthopnea/PND. Wt is up 16 lb from previous dry wt. ReDs Clip 27%. Per current med list, it appears her torsemide has been changed since last seen, down from 40 mg bid to 20 mg PRN, however she reports poor urinary response when she takes it.  EKG shows sinus tach 123  bpm. She reports full compliance w/ all meds including corlanor.  ? ?Of note, she has thyroid disease on levothyroxine for hypothyroidism. Last TSH was low at 0.098.  ? ? ?Cardiac Studies: ?- R/LHC 12/03/20:  ?Normal right heart catheterization pressures.   ?RA 4/1, mean 1;  ?RV 23/4  ?EDP 6  ?PA 22/8, mean 11 ?PA  saturation 76%.   ?PW 9/7, mean 7 mmHg.  ?CO 5.6, CI 3.88.   ?QP/QS 1.00.  ?LV: 136/9 ?EDP 17 mmHg.   ?Ao 131/86, mean 105 mmHg.  No pressure gradient across the aortic valve. ? ?RCA: A very small but dominant RCA which is diffusely diseased around 80%.  Mid segment 99% occlusion.  Conus branch collateralizes distal RCA. ?LM: Large, smooth and normal. ?LAD: Large vessel, minimal diffuse disease.  Small to moderate-sized D1 and D2. ?CX: Large vessel.  Minimal diffuse disease.  Gives origin to large OM1 and OM 2. ?  ?Impression: Nonischemic cardiomyopathy, single vessel occlusion does not explain her markedly reduced LVEF.  Medical therapy for CAD and nonischemic cardiomyopathy.  ?  ?- cMRI (12/01/20): LVEF 36% severe systolic dysfunction, normal RV size and systolic function, RV EF 64%, moderate pericardial effusion, midwall LGE basal to mid ant/anterolateral walls (myocarditis vs sarcoidosis; rec cardiac PET to evaluate for sarcoid). ? ?- Echo (1/21): EF 40%. Mod MR/TR (Dr. Einar Gip) ?- Lexiscan (2/21): EF 38% Normal perfusion ?- Echo (1/22): EF 35-40% Mod MR ? ?- cMRI  08/04/2017 [facility]): LVEF 42% mod decreased, mild decrease RV function, findings suggestive of myocarditis and infarction, small circumferential pericardial effusion. ? ?Past Medical History:  ?Diagnosis Date  ? Brain tumor ([place]) 03/29/1995  ? astrocytoma  ? CHF (congestive heart failure) ([place])   ? Cholesterosis   ? DM (diabetes mellitus) ([place]) 10/10/2018  ? Fatty liver   ? HTN (hypertension) 10/10/2018  ? Hypothyroidism 10/10/2018  ? Lipoprotein deficiency   ? Lung disease   ? longevity long term  ? Pancreatitis   ? Polycystic ovary syndrome   ? ?Current Outpatient Medications  ?Medication Sig Dispense Refill  ? albuterol (VENTOLIN HFA) 108 (90 Base) MCG/ACT inhaler Inhale 2 puffs into the lungs every 6 (six) hours as needed for wheezing or shortness of breath.    ? aspirin EC 81 MG EC tablet Take 1 tablet (81 mg total) by mouth daily. Swallow whole. 30  tablet 1  ? carvedilol (COREG) 6.25 MG tablet Take 1 tablet (6.25 mg total) by mouth 2 (two) times daily. 60 tablet 0  ? cholecalciferol (VITAMIN D) 25 MCG tablet Take 1 tablet (1,000 Units total) by mouth daily. 30 tablet 1  ? DULoxetine (CYMBALTA) 30 MG capsule Take 30 mg by mouth at bedtime.    ? erythromycin ophthalmic ointment Place one application into the right eye at bedtime.    ? Evolocumab (REPATHA) 140 MG/ML SOSY Inject 140 mg into the skin See admin instructions. Inject 140 mg subcutaneously every other Sunday evening    ? fenofibrate 160 MG tablet Take 1 tablet (160 mg total) by mouth daily. 30 tablet 1  ? fluticasone furoate-vilanterol (BREO ELLIPTA) 100-25 MCG/INH AEPB Inhale 1 puff into the lungs daily as needed (shortness of breath).    ? insulin regular human CONCENTRATED (HUMULIN R) 500 UNIT/ML injection Inject 210 Units into the skin 3 (three) times daily with meals. 100 units @ hs    ? ivabradine (CORLANOR) 5 MG TABS tablet Take 1 tablet (5 mg total) by mouth 2 (two) times daily with a meal.  60 tablet 3  ? levothyroxine (SYNTHROID) 125 MCG tablet Take 1 tablet (125 mcg total) by mouth daily at 6 (six) AM. 30 tablet 1  ? Multiple Vitamin (MULTIVITAMIN WITH MINERALS) TABS tablet Take 1 tablet by mouth daily. 30 tablet 1  ? nitroGLYCERIN (NITROSTAT) 0.4 MG SL tablet Place 1 tablet (0.4 mg total) under the tongue every 5 (five) minutes as needed for up to 25 days for chest pain. 25 tablet 3  ? nystatin-triamcinolone (MYCOLOG II) cream nystatin-triamcinolone 100,000 unit/g-0.1 % topical cream ? APPLY CREAM TOPICALLY TO AFFECTED AREA ONCE DAILY    ? ondansetron (ZOFRAN) 4 MG tablet Take 4 mg by mouth every 8 (eight) hours as needed for nausea or vomiting.    ? Pancrelipase, Lip-Prot-Amyl, (CREON PO) Take 72,000 Units by mouth 2 (two) times daily. 1 with  snack    ? pantoprazole (PROTONIX) 40 MG tablet Take 40 mg by mouth daily.    ? pregabalin (LYRICA) 100 MG capsule Take 200 mg by mouth in the  morning.    ? pregabalin (LYRICA) 100 MG capsule Take 1 capsule by mouth 2 (two) times daily.    ? pregabalin (LYRICA) 300 MG capsule Take 300 mg by mouth at bedtime.    ? prochlorperazine (COMPAZINE) 10 MG tablet

## 2021-08-01 NOTE — Progress Notes (Signed)
?Advanced Heart Failure Clinic Note  ? ? ?PCP: Sue Lush, PA-C ?PCP-Cardiologist: Dr. Einar Gip ?HF Cardiologist: Dr. Haroldine Laws ? ?HPI: ?Jennifer [name] is a 31 y.o. 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 ?  ?Has been followed by Dr. Einar Gip for her HF.  ?  ?Admitted 11/22/20 with ab pain and diarrhea. Ab CT concerning for vasculitis (no pancreatitis). Her TG were found to be  4030. She was started on an insulin/dextrose infusion. Cardiology and Nephrology were consulted after her creatinine and LFTs continued to rise and she began developing hypotension. Echo 11/28/20 EF was found to be 20-25% with moderate RV dysfunction and mild septal flattening. Started on DBA 2.5. AHF consulted for further management. PICC placed to follow CVP and Co-Ox. BP were high and milrinone added to augment diuresis. cMRI c/w prior myocarditis vs sarcoid (see below). Mod pericardial effusion also noted. LVEF 29%. RV normal. She underwent R/LHC showing single vessel RCA occlusion, RA 1, LVEDP 17, Fick CO 5.6/CI 3.88. She was able to be weaned off inotropes and GDMT titrated.   ? ?Post hospital follow up she was drinking >2L/day fluid and eating more salty foods, volume was up, ReDs 43%, instructed to increase lasix to 20 mg bid x 3 days. She followed up with [name] with [facility] 12/17/20 for a 2nd opinion. He placed LifeVest and switched beta blocker to Toprol XL. ? ?Called after hours service with swelling, instructed to continue lasix 20 mg bid. ? ?Seen in ED 12/25/20 for fall. She was standing at work and became dizzy. She fell backwards and hit her head. CT neg, labs showed K 5.7. She was given 1 dose of Lokelma. She denied palpitations. ? ?Volume overloaded at follow up 01/06/21, lasix switched to torsemide. She had a LifeVest per Cardiology at  [facility]. ? ?Echo (10/22): EF up to 45-50%, grade II DD, RV ok. LifeVest off. ? ?Treated in ED 10/22 for pancreatitis, then admitted 11/22 for hyperglycemia, did not meet criteria for DKA.  Hospitalization c/b hyponatremia, sodium 118. Concern for volume overload. Lasix restarted but SCr increased. Nephrology consulted and restarted gentle IVF and GDMT held for a couple of days. Recommendation for Tolvaptan, however patient declined and requested discharge with outpatient follow-up her MD at [facility]. Jardiance stopped at discharge, weight 129 lbs.  ? ?Seen back in ED a few days later with increased weight and abd distention. Given IV lasix in ED and torsemide increased to 40 bid. ? ?At last visit 11/22, her breathing had improved but she complained of abdominal swelling/bloating, not felt to be related to volume overload. Concern for possible gastroparesis. She was referred to GI. They recommended abdominal CT. This was completed 12/9 and was unremarkable. Ceruloplasmin level was also low, raising concern for possible Wilson's Disease. However had further w/u w/ [facility] and reports Wilson's disease was ruled out. Additional GI testing unremarkable.  ? ?Echo 12/22 EF 45%, RV normal.  ? ?She presents today for routine f/u. Reports increased gradual wt gain/ abdominal distention/bloating + palpitations and exertional dyspnea. NYHA Class II-III. No orthopnea/PND. Wt is up 16 lb from previous dry wt. ReDs Clip 27%. Per current med list, it appears her torsemide has been changed since last seen, down from 40 mg bid to 20 mg PRN, however she reports poor urinary response when she takes it.  EKG shows sinus tach 123  bpm. She reports full compliance w/ all meds including corlanor.  ? ?Of note, she has thyroid disease on levothyroxine for hypothyroidism. Last TSH was low at 0.098.  ? ? ?Cardiac Studies: ?- R/LHC 12/03/20:  ?Normal right heart catheterization pressures.   ?RA 4/1, mean 1;  ?RV 23/4  ?EDP 6  ?PA 22/8, mean 11 ?PA  saturation 76%.   ?PW 9/7, mean 7 mmHg.  ?CO 5.6, CI 3.88.   ?QP/QS 1.00.  ?LV: 136/9 ?EDP 17 mmHg.   ?Ao 131/86, mean 105 mmHg.  No pressure gradient across the aortic valve. ? ?RCA: A very small but dominant RCA which is diffusely diseased around 80%.  Mid segment 99% occlusion.  Conus branch collateralizes distal RCA. ?LM: Large, smooth and normal. ?LAD: Large vessel, minimal diffuse disease.  Small to moderate-sized D1 and D2. ?CX: Large vessel.  Minimal diffuse disease.  Gives origin to large OM1 and OM 2. ?  ?Impression: Nonischemic cardiomyopathy, single vessel occlusion does not explain her markedly reduced LVEF.  Medical therapy for CAD and nonischemic cardiomyopathy.  ?  ?- cMRI (12/01/20): LVEF 32% severe systolic dysfunction, normal RV size and systolic function, RV EF 44%, moderate pericardial effusion, midwall LGE basal to mid ant/anterolateral walls (myocarditis vs sarcoidosis; rec cardiac PET to evaluate for sarcoid). ? ?- Echo (1/21): EF 40%. Mod MR/TR (Dr. Einar Gip) ?- Lexiscan (2/21): EF 38% Normal perfusion ?- Echo (1/22): EF 35-40% Mod MR ? ?- cMRI  08/04/2017 [facility]): LVEF 42% mod decreased, mild decrease RV function, findings suggestive of myocarditis and infarction, small circumferential pericardial effusion. ? ?Past Medical History:  ?Diagnosis Date  ? Brain tumor ([place]) 03/29/1995  ? astrocytoma  ? CHF (congestive heart failure) ([place])   ? Cholesterosis   ? DM (diabetes mellitus) ([place]) 10/10/2018  ? Fatty liver   ? HTN (hypertension) 10/10/2018  ? Hypothyroidism 10/10/2018  ? Lipoprotein deficiency   ? Lung disease   ? longevity long term  ? Pancreatitis   ? Polycystic ovary syndrome   ? ?Current Outpatient Medications  ?Medication Sig Dispense Refill  ? albuterol (VENTOLIN HFA) 108 (90 Base) MCG/ACT inhaler Inhale 2 puffs into the lungs every 6 (six) hours as needed for wheezing or shortness of breath.    ? aspirin EC 81 MG EC tablet Take 1 tablet (81 mg total) by mouth daily. Swallow whole. 30  tablet 1  ? carvedilol (COREG) 6.25 MG tablet Take 1 tablet (6.25 mg total) by mouth 2 (two) times daily. 60 tablet 0  ? cholecalciferol (VITAMIN D) 25 MCG tablet Take 1 tablet (1,000 Units total) by mouth daily. 30 tablet 1  ? DULoxetine (CYMBALTA) 30 MG capsule Take 30 mg by mouth at bedtime.    ? erythromycin ophthalmic ointment Place one application into the right eye at bedtime.    ? Evolocumab (REPATHA) 140 MG/ML SOSY Inject 140 mg into the skin See admin instructions. Inject 140 mg subcutaneously every other Sunday evening    ? fenofibrate 160 MG tablet Take 1 tablet (160 mg total) by mouth daily. 30 tablet 1  ? fluticasone furoate-vilanterol (BREO ELLIPTA) 100-25 MCG/INH AEPB Inhale 1 puff into the lungs daily as needed (shortness of breath).    ? insulin regular human CONCENTRATED (HUMULIN R) 500 UNIT/ML injection Inject 210 Units into the skin 3 (three) times daily with meals. 100 units @ hs    ? ivabradine (CORLANOR) 5 MG TABS tablet Take 1 tablet (5 mg total) by mouth 2 (two) times daily with a meal.  60 tablet 3  ? levothyroxine (SYNTHROID) 125 MCG tablet Take 1 tablet (125 mcg total) by mouth daily at 6 (six) AM. 30 tablet 1  ? Multiple Vitamin (MULTIVITAMIN WITH MINERALS) TABS tablet Take 1 tablet by mouth daily. 30 tablet 1  ? nitroGLYCERIN (NITROSTAT) 0.4 MG SL tablet Place 1 tablet (0.4 mg total) under the tongue every 5 (five) minutes as needed for up to 25 days for chest pain. 25 tablet 3  ? nystatin-triamcinolone (MYCOLOG II) cream nystatin-triamcinolone 100,000 unit/g-0.1 % topical cream ? APPLY CREAM TOPICALLY TO AFFECTED AREA ONCE DAILY    ? ondansetron (ZOFRAN) 4 MG tablet Take 4 mg by mouth every 8 (eight) hours as needed for nausea or vomiting.    ? Pancrelipase, Lip-Prot-Amyl, (CREON PO) Take 72,000 Units by mouth 2 (two) times daily. 1 with  snack    ? pantoprazole (PROTONIX) 40 MG tablet Take 40 mg by mouth daily.    ? pregabalin (LYRICA) 100 MG capsule Take 200 mg by mouth in the  morning.    ? pregabalin (LYRICA) 100 MG capsule Take 1 capsule by mouth 2 (two) times daily.    ? pregabalin (LYRICA) 300 MG capsule Take 300 mg by mouth at bedtime.    ? prochlorperazine (COMPAZINE) 10 MG tablet

## 2021-08-02 ENCOUNTER — Other Ambulatory Visit (HOSPITAL_COMMUNITY): Payer: Self-pay

## 2021-08-02 ENCOUNTER — Ambulatory Visit (HOSPITAL_COMMUNITY)
Admission: RE | Admit: 2021-08-02 | Discharge: 2021-08-02 | Disposition: A | Discharge status: Home / Self Care | Payer: BC Managed Care – PPO | Source: Ambulatory Visit | Attending: Internal Medicine | Admitting: Internal Medicine

## 2021-08-02 ENCOUNTER — Encounter (HOSPITAL_COMMUNITY): Payer: Self-pay | Admitting: Internal Medicine

## 2021-08-02 VITALS — BP 190/100 | HR 120 | Wt 145.4 lb

## 2021-08-02 DIAGNOSIS — E781 Pure hyperglyceridemia: Secondary | ICD-10-CM | POA: Diagnosis not present

## 2021-08-02 DIAGNOSIS — I5022 Chronic systolic (congestive) heart failure: Secondary | ICD-10-CM | POA: Diagnosis not present

## 2021-08-02 DIAGNOSIS — Z79890 Hormone replacement therapy (postmenopausal): Secondary | ICD-10-CM | POA: Insufficient documentation

## 2021-08-02 DIAGNOSIS — E039 Hypothyroidism, unspecified: Secondary | ICD-10-CM | POA: Diagnosis not present

## 2021-08-02 DIAGNOSIS — R140 Abdominal distension (gaseous): Secondary | ICD-10-CM

## 2021-08-02 DIAGNOSIS — I110 Hypertensive heart disease with heart failure: Secondary | ICD-10-CM | POA: Diagnosis not present

## 2021-08-02 DIAGNOSIS — Z79899 Other long term (current) drug therapy: Secondary | ICD-10-CM | POA: Diagnosis not present

## 2021-08-02 DIAGNOSIS — I2510 Atherosclerotic heart disease of native coronary artery without angina pectoris: Secondary | ICD-10-CM | POA: Insufficient documentation

## 2021-08-02 DIAGNOSIS — Z8719 Personal history of other diseases of the digestive system: Secondary | ICD-10-CM | POA: Diagnosis not present

## 2021-08-02 DIAGNOSIS — K760 Fatty (change of) liver, not elsewhere classified: Secondary | ICD-10-CM | POA: Diagnosis not present

## 2021-08-02 DIAGNOSIS — K861 Other chronic pancreatitis: Secondary | ICD-10-CM | POA: Diagnosis not present

## 2021-08-02 DIAGNOSIS — R1900 Intra-abdominal and pelvic swelling, mass and lump, unspecified site: Secondary | ICD-10-CM | POA: Diagnosis not present

## 2021-08-02 DIAGNOSIS — R000 Tachycardia, unspecified: Secondary | ICD-10-CM

## 2021-08-02 DIAGNOSIS — E109 Type 1 diabetes mellitus without complications: Secondary | ICD-10-CM | POA: Diagnosis not present

## 2021-08-02 LAB — CBC
HCT: 35.2 % — ABNORMAL LOW (ref 36.0–46.0)
Hemoglobin: 13 g/dL (ref 12.0–15.0)
MCH: 32.3 pg (ref 26.0–34.0)
MCHC: 36.9 g/dL — ABNORMAL HIGH (ref 30.0–36.0)
MCV: 87.3 fL (ref 80.0–100.0)
Platelets: 303 10*3/uL (ref 150–400)
RBC: 4.03 MIL/uL (ref 3.87–5.11)
RDW: 15.7 % — ABNORMAL HIGH (ref 11.5–15.5)
WBC: 7.2 10*3/uL (ref 4.0–10.5)
nRBC: 0 % (ref 0.0–0.2)

## 2021-08-02 LAB — BASIC METABOLIC PANEL
Anion gap: 9 (ref 5–15)
BUN: 30 mg/dL — ABNORMAL HIGH (ref 6–20)
CO2: 18 mmol/L — ABNORMAL LOW (ref 22–32)
Calcium: 9.4 mg/dL (ref 8.9–10.3)
Chloride: 102 mmol/L (ref 98–111)
Creatinine, Ser: 1.06 mg/dL — ABNORMAL HIGH (ref 0.44–1.00)
GFR, Estimated: >60 mL/min (ref >60)
Glucose, Bld: 132 mg/dL — ABNORMAL HIGH (ref 70–99)
Potassium: >7.5 mmol/L (ref 3.5–5.1)
Sodium: 129 mmol/L — ABNORMAL LOW (ref 135–145)

## 2021-08-02 LAB — T4, FREE: Free T4: 0.77 ng/dL (ref 0.61–1.12)

## 2021-08-02 LAB — TSH: TSH: 2.532 u[IU]/mL (ref 0.350–4.500)

## 2021-08-02 MED ORDER — IVABRADINE HCL 7.5 MG PO TABS: 7.5000 mg | ORAL_TABLET | Freq: Two times a day (BID) | ORAL | 6 refills | Status: DC

## 2021-08-02 NOTE — Addendum Note (Signed)
Addended by: Scarlette Calico on: 08/02/2021 05:19 PM ? ? Modules accepted: Orders ? ?

## 2021-08-02 NOTE — Addendum Note (Signed)
Encounter addended by: Stanford Scotland, RN on: 08/02/2021 12:40 PM ? Actions taken: Flowsheet accepted, Clinical Note Signed

## 2021-08-02 NOTE — Progress Notes (Signed)
?   ReDS Vest / Clip - 08/02/21 1200   ? ?  ? ReDS Vest / Clip  ? Station Marker B   ? Ruler Value 33.5   ? ReDS Value Range Low volume   ? ReDS Actual Value 27   ? ?  ?  ? ?  ? ? ?

## 2021-08-02 NOTE — Patient Instructions (Signed)
Medication Changes: ? ?INCREASE corlanor to 7.57m, twice a day.  ? ?Lab Work: ? ?Labs done today, your results will be available in MyChart, we will contact you for abnormal readings. ? ?Testing/Procedures: ? ?Your physician has requested that you have a cardiac catheterization. Cardiac catheterization is used to diagnose and/or treat various heart conditions. Doctors may recommend this procedure for a number of different reasons. The most common reason is to evaluate chest pain. Chest pain can be a symptom of coronary artery disease (CAD), and cardiac catheterization can show whether plaque is narrowing or blocking your heart?s arteries. This procedure is also used to evaluate the valves, as well as measure the blood flow and oxygen levels in different parts of your heart. For further information please visit [email or web address] Please follow instruction listed below.  ? ?Referrals: ? ?none ? ?Special Instructions // Education: ? ?Do the following things EVERYDAY: ?Weigh yourself in the morning before breakfast. Write it down and keep it in a log. ?Take your medicines as prescribed ?Eat low salt foods--Limit salt (sodium) to 2000 mg per day.  ?Stay as active as you can everyday ?Limit all fluids for the day to less than 2 liters ? ? ?Follow-Up in: 4-6 weeks  ? ?At the [facility] you and your health needs are our priority. We have a designated team specialized in the treatment of Heart Failure. This Care Team includes your primary Heart Failure Specialized Cardiologist (physician), Advanced Practice Providers (APPs- Physician Assistants and Nurse Practitioners), and Pharmacist who all work together to provide you with the care you need, when you need it.  ? ?You may see any of the following providers on your designated Care Team at your next follow up: ? ?Dr DGlori Bickers?Dr DLoralie Champagne?ADarrick Grinder NP ?BLyda Jester PA ?Jessica Milford,NP ?LMarlyce Huge PA ?LAudry Riles  PharmD ? ? ?Please be sure to bring in all your medications bottles to every appointment.  ? ?Need to Contact [place] ? ?If you have any questions or concerns before your next appointment please send [place] message through mApplegateor call our office at [phone]   ? ?TO LEAVE A MESSAGE FOR THE NURSE SELECT OPTION 2, PLEASE LEAVE A MESSAGE INCLUDING: ?YOUR NAME ?DATE OF BIRTH ?CALL BACK NUMBER ?REASON FOR CALL**this is important as we prioritize the call backs ? ?YOU WILL RECEIVE A CALL BACK THE SAME DAY AS LONG AS YOU CALL BEFORE 4:00 PM ? ?You are scheduled for a Cardiac Catheterization on Thursday, May 11 with Dr. DGlori Bickers ? ?1. [facility] [address] [place] [id number] 7:00 AM (This time is two hours before your procedure to ensure your preparation). Free valet parking service is available.  ? ?Special note: Every effort is made to have your procedure done on time. Please understand that emergencies sometimes delay scheduled procedures. ? ?2. Diet: Do not eat solid foods after midnight.  You may have clear liquids until 5 AM upon the day of the procedure. ? ?3. Labs: You will need to have blood drawn on done today. ? ?4. Medication instructions in preparation for your procedure: ? ? Thursday, May 11 AM DO NOT TAKE: insulin, torsemide, or spironolactone.  ? ?On the morning of your procedure, take Aspirin and any morning medicines NOT listed above.  You may use sips of water. ? ?5. Plan to go home the same day, you will only stay overnight if medically necessary. ?  6. You MUST have a responsible adult to drive you home. ?7. An adult MUST be with you the first 24 hours after you arrive home. ?8. Bring a current list of your medications, and the last time and date medication taken. ?9. Bring ID and current insurance cards. ?10.Please wear clothes that are easy to get on and off and wear slip-on shoes. ? ?Thank you for allowing [place] to care for you! ?   -- [facility] ? ? ?

## 2021-08-03 ENCOUNTER — Telehealth (HOSPITAL_COMMUNITY): Payer: Self-pay

## 2021-08-03 ENCOUNTER — Encounter (HOSPITAL_COMMUNITY): Payer: Self-pay

## 2021-08-03 LAB — T3, FREE: T3, Free: 2.4 pg/mL (ref 2.0–4.4)

## 2021-08-03 NOTE — Telephone Encounter (Signed)
Spoke to patient about [place] for may 11. Aware of procedure.medications to be held  and person to drive to and from procedure. ?

## 2021-08-04 LAB — VITAMIN B1: Vitamin B1 (Thiamine): 138.3 nmol/L (ref 66.5–200.0)

## 2021-08-05 ENCOUNTER — Other Ambulatory Visit: Payer: Self-pay

## 2021-08-05 ENCOUNTER — Ambulatory Visit (HOSPITAL_COMMUNITY)
Admission: RE | Admit: 2021-08-05 | Discharge: 2021-08-05 | Disposition: A | Discharge status: Home / Self Care | Payer: BC Managed Care – PPO | Attending: Internal Medicine | Admitting: Internal Medicine

## 2021-08-05 ENCOUNTER — Encounter (HOSPITAL_COMMUNITY): Payer: Self-pay | Admitting: Internal Medicine

## 2021-08-05 ENCOUNTER — Encounter (HOSPITAL_COMMUNITY)
Admission: RE | Disposition: A | Discharge status: Home / Self Care | Payer: Self-pay | Source: Home / Self Care | Attending: Internal Medicine

## 2021-08-05 ENCOUNTER — Telehealth (HOSPITAL_COMMUNITY): Payer: Self-pay | Admitting: *Deleted

## 2021-08-05 DIAGNOSIS — Z7982 Long term (current) use of aspirin: Secondary | ICD-10-CM | POA: Insufficient documentation

## 2021-08-05 DIAGNOSIS — I110 Hypertensive heart disease with heart failure: Secondary | ICD-10-CM | POA: Diagnosis present

## 2021-08-05 DIAGNOSIS — I272 Pulmonary hypertension, unspecified: Secondary | ICD-10-CM | POA: Insufficient documentation

## 2021-08-05 DIAGNOSIS — R000 Tachycardia, unspecified: Secondary | ICD-10-CM | POA: Diagnosis not present

## 2021-08-05 DIAGNOSIS — I5023 Acute on chronic systolic (congestive) heart failure: Secondary | ICD-10-CM

## 2021-08-05 DIAGNOSIS — E781 Pure hyperglyceridemia: Secondary | ICD-10-CM | POA: Diagnosis not present

## 2021-08-05 DIAGNOSIS — R1900 Intra-abdominal and pelvic swelling, mass and lump, unspecified site: Secondary | ICD-10-CM | POA: Diagnosis not present

## 2021-08-05 DIAGNOSIS — E109 Type 1 diabetes mellitus without complications: Secondary | ICD-10-CM | POA: Insufficient documentation

## 2021-08-05 DIAGNOSIS — I2510 Atherosclerotic heart disease of native coronary artery without angina pectoris: Secondary | ICD-10-CM | POA: Diagnosis not present

## 2021-08-05 DIAGNOSIS — I5022 Chronic systolic (congestive) heart failure: Secondary | ICD-10-CM

## 2021-08-05 DIAGNOSIS — E039 Hypothyroidism, unspecified: Secondary | ICD-10-CM | POA: Diagnosis not present

## 2021-08-05 DIAGNOSIS — Z79899 Other long term (current) drug therapy: Secondary | ICD-10-CM | POA: Insufficient documentation

## 2021-08-05 DIAGNOSIS — Z79890 Hormone replacement therapy (postmenopausal): Secondary | ICD-10-CM | POA: Insufficient documentation

## 2021-08-05 HISTORY — PX: RIGHT HEART CATH: CATH118263

## 2021-08-05 LAB — POCT I-STAT EG7
Acid-base deficit: 1 mmol/L (ref 0.0–2.0)
Acid-base deficit: 2 mmol/L (ref 0.0–2.0)
Acid-base deficit: 3 mmol/L — ABNORMAL HIGH (ref 0.0–2.0)
Bicarbonate: 23.5 mmol/L (ref 20.0–28.0)
Bicarbonate: 23.9 mmol/L (ref 20.0–28.0)
Bicarbonate: 24.8 mmol/L (ref 20.0–28.0)
Calcium, Ion: 1.2 mmol/L (ref 1.15–1.40)
Calcium, Ion: 1.22 mmol/L (ref 1.15–1.40)
Calcium, Ion: 1.24 mmol/L (ref 1.15–1.40)
HCT: 34 % — ABNORMAL LOW (ref 36.0–46.0)
HCT: 35 % — ABNORMAL LOW (ref 36.0–46.0)
HCT: 35 % — ABNORMAL LOW (ref 36.0–46.0)
Hemoglobin: 11.6 g/dL — ABNORMAL LOW (ref 12.0–15.0)
Hemoglobin: 11.9 g/dL — ABNORMAL LOW (ref 12.0–15.0)
Hemoglobin: 11.9 g/dL — ABNORMAL LOW (ref 12.0–15.0)
O2 Saturation: 63 %
O2 Saturation: 63 %
O2 Saturation: 72 %
Potassium: 4.3 mmol/L (ref 3.5–5.1)
Potassium: 4.4 mmol/L (ref 3.5–5.1)
Potassium: 4.5 mmol/L (ref 3.5–5.1)
Sodium: 138 mmol/L (ref 135–145)
Sodium: 138 mmol/L (ref 135–145)
Sodium: 140 mmol/L (ref 135–145)
TCO2: 25 mmol/L (ref 22–32)
TCO2: 25 mmol/L (ref 22–32)
TCO2: 26 mmol/L (ref 22–32)
pCO2, Ven: 44.5 mmHg (ref 44–60)
pCO2, Ven: 44.6 mmHg (ref 44–60)
pCO2, Ven: 45.6 mmHg (ref 44–60)
pH, Ven: 7.33 (ref 7.25–7.43)
pH, Ven: 7.337 (ref 7.25–7.43)
pH, Ven: 7.344 (ref 7.25–7.43)
pO2, Ven: 35 mmHg (ref 32–45)
pO2, Ven: 35 mmHg (ref 32–45)
pO2, Ven: 40 mmHg (ref 32–45)

## 2021-08-05 LAB — BASIC METABOLIC PANEL
Anion gap: 10 (ref 5–15)
BUN: 40 mg/dL — ABNORMAL HIGH (ref 6–20)
CO2: 20 mmol/L — ABNORMAL LOW (ref 22–32)
Calcium: 9.2 mg/dL (ref 8.9–10.3)
Chloride: 104 mmol/L (ref 98–111)
Creatinine, Ser: 1.26 mg/dL — ABNORMAL HIGH (ref 0.44–1.00)
GFR, Estimated: 59 mL/min — ABNORMAL LOW (ref >60)
Glucose, Bld: 100 mg/dL — ABNORMAL HIGH (ref 70–99)
Potassium: 4.3 mmol/L (ref 3.5–5.1)
Sodium: 134 mmol/L — ABNORMAL LOW (ref 135–145)

## 2021-08-05 LAB — PREGNANCY, URINE: Preg Test, Ur: NEGATIVE

## 2021-08-05 LAB — GLUCOSE, CAPILLARY
Glucose-Capillary: 103 mg/dL — ABNORMAL HIGH (ref 70–99)
Glucose-Capillary: 114 mg/dL — ABNORMAL HIGH (ref 70–99)

## 2021-08-05 SURGERY — RIGHT HEART CATH
Anesthesia: LOCAL

## 2021-08-05 MED ORDER — TORSEMIDE 20 MG PO TABS: 40.0000 mg | ORAL_TABLET | Freq: Two times a day (BID) | ORAL | 11 refills | Status: DC

## 2021-08-05 MED ORDER — SODIUM CHLORIDE 0.9 % IV SOLN: 250.0000 mL | INTRAVENOUS | Status: DC | PRN

## 2021-08-05 MED ORDER — LIDOCAINE HCL (PF) 1 % IJ SOLN
INTRAMUSCULAR | Status: AC
  Filled 2021-08-05: qty 30

## 2021-08-05 MED ORDER — SODIUM CHLORIDE 0.9% FLUSH: 3.0000 mL | Freq: Two times a day (BID) | INTRAVENOUS | Status: DC

## 2021-08-05 MED ORDER — LIDOCAINE HCL (PF) 1 % IJ SOLN
INTRAMUSCULAR | Status: DC | PRN
  Administered 2021-08-05: 2 mL via INTRADERMAL

## 2021-08-05 MED ORDER — SODIUM CHLORIDE 0.9% FLUSH: 3.0000 mL | INTRAVENOUS | Status: DC | PRN

## 2021-08-05 MED ORDER — ACETAMINOPHEN 325 MG PO TABS: 650.0000 mg | ORAL_TABLET | ORAL | Status: DC | PRN

## 2021-08-05 MED ORDER — PREGABALIN 100 MG PO CAPS: 100.0000 mg | ORAL_CAPSULE | ORAL | 10 refills | Status: DC

## 2021-08-05 MED ORDER — METOLAZONE 2.5 MG PO TABS
2.5000 mg | ORAL_TABLET | ORAL | 3 refills | Status: DC
Start: 2021-08-05 — End: 2021-08-13

## 2021-08-05 MED ORDER — HYDRALAZINE HCL 20 MG/ML IJ SOLN
INTRAMUSCULAR | Status: AC
  Filled 2021-08-05: qty 1

## 2021-08-05 MED ORDER — FUROSEMIDE 10 MG/ML IJ SOLN
80.0000 mg | Freq: Once | INTRAMUSCULAR | Status: AC
  Administered 2021-08-05: 80 mg via INTRAVENOUS
  Filled 2021-08-05: qty 8

## 2021-08-05 MED ORDER — POTASSIUM CHLORIDE CRYS ER 20 MEQ PO TBCR: 40.0000 meq | EXTENDED_RELEASE_TABLET | ORAL | 3 refills | Status: DC

## 2021-08-05 MED ORDER — HYDRALAZINE HCL 20 MG/ML IJ SOLN: 10.0000 mg | INTRAMUSCULAR | Status: DC | PRN

## 2021-08-05 MED ORDER — LABETALOL HCL 5 MG/ML IV SOLN: 10.0000 mg | INTRAVENOUS | Status: DC | PRN

## 2021-08-05 MED ORDER — IVABRADINE HCL 7.5 MG PO TABS
7.5000 mg | ORAL_TABLET | Freq: Once | ORAL | Status: AC
  Administered 2021-08-05: 7.5 mg via ORAL
  Filled 2021-08-05: qty 1

## 2021-08-05 MED ORDER — SODIUM CHLORIDE 0.9 % IV SOLN: INTRAVENOUS | Status: DC

## 2021-08-05 MED ORDER — HEPARIN (PORCINE) IN NACL 1000-0.9 UT/500ML-% IV SOLN
INTRAVENOUS | Status: DC | PRN
  Administered 2021-08-05: 500 mL

## 2021-08-05 MED ORDER — HYDRALAZINE HCL 20 MG/ML IJ SOLN
INTRAMUSCULAR | Status: DC | PRN
  Administered 2021-08-05: 10 mg via INTRAVENOUS

## 2021-08-05 SURGICAL SUPPLY — 7 items
CATH BALLN WEDGE 5F 110CM (CATHETERS) ×1 IMPLANT
GUIDEWIRE .025 260CM (WIRE) ×1 IMPLANT
KIT MICROPUNCTURE NIT STIFF (SHEATH) ×1 IMPLANT
PACK CARDIAC CATHETERIZATION (CUSTOM PROCEDURE TRAY) ×2 IMPLANT
SHEATH GLIDE SLENDER 4/5FR (SHEATH) ×1 IMPLANT
SHEATH PROBE COVER 6X72 (BAG) ×1 IMPLANT
TRANSDUCER W/STOPCOCK (MISCELLANEOUS) ×2 IMPLANT

## 2021-08-05 NOTE — Progress Notes (Signed)
At 1047, pt c/o 5/10 chest pressure, sharp pain, and heart racing. HR 110 on pulse ox. EKG obtained and showed sinus tach. EKG shown to Dr. Haroldine Laws via telephone by Gay Filler, pharmacist, who was already talking to Dr. Haroldine Laws. Per Dr. Haroldine Laws, pt's HR was ST in 120's during the Brinckerhoff procedure. Dr. Haroldine Laws ordered her home dose of Corlanor 7.38m and Lasix 844mIV to be given. Medications given as ordered. At 1130, pt reports that the chest pain/pressure has resolved and pt denies pain at this time. Sa[facility]ay Fillerrom pharmacy completed med rec and pt anticipates DC at 1200.  ?

## 2021-08-05 NOTE — Interval H&P Note (Signed)
History and Physical Interval Note: ? ?08/05/2021 ?8:11 AM ? ?Jennifer [name]  has presented today for surgery, with the diagnosis of heart failure.  The various methods of treatment have been discussed with the patient and family. After consideration of risks, benefits and other options for treatment, the patient has consented to  Procedure(s): ?RIGHT HEART CATH (N/A) as a surgical intervention.  The patient's history has been reviewed, patient examined, no change in status, stable for surgery.  I have reviewed the patient's chart and labs.  Questions were answered to the patient's satisfaction.   ? ? ?[name] ? ? ?

## 2021-08-05 NOTE — Telephone Encounter (Signed)
Per Dr Haroldine Laws, pt needs: ? ?Metolazone 2.5 mg today, tomorrow and then every Wednesday along with 40 meq of KCL with each dose of metolazone. Will need f/u appt next week ? ?Rx's sent in, will call pt later today to ensure she understand and sch f/u appt ?

## 2021-08-06 NOTE — Telephone Encounter (Signed)
Left message to call back  

## 2021-08-09 NOTE — Telephone Encounter (Signed)
Spoke w/pt, she did take Metolazone and KCL as ordered last week. She mentions that she has been having palps off/on since Friday, better today, f/u appt sch for tomorrow 5/16 ?

## 2021-08-09 NOTE — Progress Notes (Addendum)
?Advanced Heart Failure Clinic Note  ? ? ?PCP: Jennifer Lush, PA-C ?PCP-Cardiologist: Dr. Einar [name] ?HF Cardiologist: Dr. Haroldine [name] ? ?HPI: ?Jennifer [name] is a 31 y.o. 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 ?  ?Has been followed by Dr. Einar [name] for her HF.  ?  ?Admitted 11/22/20 with ab pain and diarrhea. Ab CT concerning for vasculitis (no pancreatitis). Her TG were found to be  4030. She was started on an insulin/dextrose infusion. Cardiology and Nephrology were consulted after her creatinine and LFTs continued to rise and she began developing hypotension. Echo 11/28/20 EF was found to be 20-25% with moderate RV dysfunction and mild septal flattening. Started on DBA 2.5. AHF consulted for further management. PICC placed to follow CVP and Co-Ox. BP were high and milrinone added to augment diuresis. cMRI c/w prior myocarditis vs sarcoid (see below). Mod pericardial effusion also noted. LVEF 29%. RV normal. She underwent R/LHC showing single vessel RCA occlusion, RA 1, LVEDP 17, Fick CO 5.6/CI 3.88. She was able to be weaned off inotropes and GDMT titrated.   ? ?Post hospital follow up she was drinking >2L/day fluid and eating more salty foods, volume was up, ReDs 43%, instructed to increase lasix to 20 mg bid x 3 days. She followed up with Dr. Geanie [name] with [facility] 12/17/20 for a 2nd opinion. He placed LifeVest and switched beta blocker to Toprol XL. ? ?Called after hours service with swelling, instructed to continue lasix 20 mg bid. ? ?Seen in ED 12/25/20 for fall. She was standing at work and became dizzy. She fell backwards and hit her head. CT neg, labs showed K 5.7. She was given 1 dose of Lokelma. She denied palpitations. ? ?Volume overloaded at follow up 01/06/21, lasix switched to torsemide. She had a LifeVest per Cardiology at  [facility]. ? ?Echo (10/22): EF up to 45-50%, grade II DD, RV ok. LifeVest off. ? ?Treated in ED 10/22 for pancreatitis, then admitted 11/22 for hyperglycemia, did not meet criteria for DKA.  Hospitalization c/b hyponatremia, sodium 118. Concern for volume overload. Lasix restarted but SCr increased. Nephrology consulted and restarted gentle IVF and GDMT held for a couple of days. Recommendation for Tolvaptan, however patient declined and requested discharge with outpatient follow-up her MD at [facility]. Jardiance stopped at discharge, weight 129 lbs.  ? ?Seen back in ED a few days later with increased weight and abd distention. Given IV lasix in ED and torsemide increased to 40 bid. ? ?At last visit 11/22, her breathing had improved but she complained of abdominal swelling/bloating, not felt to be related to volume overload. Concern for possible gastroparesis. She was referred to GI. They recommended abdominal CT. This was completed 12/9 and was unremarkable. Ceruloplasmin level was also low, raising concern for possible Wilson's Disease. However had further w/u w/ [facility] and reports Wilson's disease was ruled out. Additional GI testing unremarkable.  ? ?Echo 12/22 EF 45%, RV normal.  ? ?Follow up 08/02/21, weight up 16 lbs, ReDs 27%. + abdominal swelling and bloating and remained tachycardic. GI work up without explanation for symptoms. Arranged for RHC to better assess hemodynamics and see if abdominal symptoms related to right-sided HF due to high-output state or non-cardiac (see results below).  ? ?Jennifer [name] 5/23 with elevated filling pressures and normal cardiac ouput. Mild to moderate mixed pulmonary hypertension with prominent v-waves in RA, suggestive of  significant TR. Given IV lasix and metolazone 2.5 added weekly to diuretic regimen.  ? ?RA = 12 prominent V waves ?RV = 47/14 ?PA = 48/25 (37) ?PCW = 25 ?Fick cardiac output/index = 4.4/2.8 ?PVR =3.7 WU ?Ao sat = 99% ?PA sat = 62%, 62% ? ?Today she returns for post cath HF  follow up. Overall feeling fair. More SOB walking short distances on flat ground. Took a metolazone Fri and Sat with extra KCL. Has more palpitations and fluttering in chest. Denies CP, dizziness, or PND/Orthopnea. Appetite ok. No fever or chills. Weight at home 143 pounds. Taking all medications. Eats out 2-3x/week, drinking more fluids during the day. Noticed blood in stool today. ? ?Cardiac Studies: ?- RHC (5/23) ?  ?RA = 12 prominent V waves ?RV = 47/14 ?PA = 48/25 (37) ?PCW = 25 ?Fick cardiac output/index = 4.4/2.8 ?PVR =3.7 WU ?Ao sat = 99% ?PA sat = 62%, 62% ?  ?Assessment: ?1. Elevated filling pressures with normal cardiac output ?2. Mild to moderate mixed pulmonary HTN with prominent v-waves in RA suggestive of significant TR ?  ?Plan/Discussion: Diurese.  Wil give dose of IV lasix prior to d/c. Add metolazone 2.5 + Kcl 40 once a week. Recheck echo to look at TV. Can consider cautious trial of selective pulmonary vasodilators once fluid status improved.  ? ?- R/LHC 9/22: ?Normal right heart catheterization pressures.   ?RA 4/1, mean 1;  ?RV 23/4  ?EDP 6  ?PA 22/8, mean 11 ?PA saturation 76%.   ?PW 9/7, mean 7 mmHg.  ?CO 5.6, CI 3.88.   ?QP/QS 1.00.  ?LV: 136/9 ?EDP 17 mmHg.   ?Ao 131/86, mean 105 mmHg.  No pressure gradient across the aortic valve. ? ?RCA: A very small but dominant RCA which is diffusely diseased around 80%.  Mid segment 99% occlusion.  Conus branch collateralizes distal RCA. ?LM: Large, smooth and normal. ?LAD: Large vessel, minimal diffuse disease.  Small to moderate-sized D1 and D2. ?CX: Large vessel.  Minimal diffuse disease.  Gives origin to large OM1 and OM 2. ?  ?Impression: Nonischemic cardiomyopathy, single vessel occlusion does not explain her markedly reduced LVEF.  Medical therapy for CAD and nonischemic cardiomyopathy.  ?  ?- cMRI (12/01/20): LVEF 75% severe systolic dysfunction, normal RV size and systolic function, RV EF 64%, moderate pericardial effusion, midwall LGE basal to  mid ant/anterolateral walls (myocarditis vs sarcoidosis; rec cardiac PET to evaluate for sarcoid). ? ?- Echo (1/21): EF 40%. Mod MR/TR (Dr. Einar [name]) ?- Lexiscan (2/21): EF 38% Normal perfusion ?- Echo (1/22): EF 35-40% Mod MR ? ?- cMRI  08/04/2017 [facility]): LVEF 42% mod decreased, mild decrease RV function, findings suggestive of myocarditis and infarction, small circumferential pericardial effusion. ? ?Past Medical History:  ?Diagnosis Date  ? Brain tumor ([place]) 03/29/1995  ? astrocytoma  ? CHF (congestive heart failure) ([place])   ? Cholesterosis   ? DM (diabetes mellitus) ([place]) 10/10/2018  ? Fatty liver   ? HTN (hypertension) 10/10/2018  ? Hypothyroidism 10/10/2018  ? Lipoprotein deficiency   ? Lung disease   ? longevity long term  ? Pancreatitis   ? Polycystic ovary syndrome   ? ?Current Outpatient Medications  ?Medication Sig Dispense Refill  ? albuterol (VENTOLIN HFA) 108 (90 Base) MCG/ACT inhaler Inhale 2 puffs into the lungs every 6 (six) hours as needed for wheezing or shortness of breath.    ? aspirin EC 81 MG EC tablet Take 1 tablet (81 mg total) by mouth daily. Swallow whole. [place]  tablet 1  ? carvedilol (COREG) 6.25 MG tablet Take 1 tablet (6.25 mg total) by mouth 2 (two) times daily. 60 tablet 0  ? cetirizine (ZYRTEC) 10 MG tablet Take 10 mg by mouth daily.    ? DULoxetine (CYMBALTA) 30 MG capsule Take 30 mg by mouth at bedtime.    ? Evolocumab (REPATHA) 140 MG/ML SOSY Inject 140 mg into the skin See admin instructions. Inject 140 mg subcutaneously every other Sunday evening    ? fenofibrate 160 MG tablet Take 1 tablet (160 mg total) by mouth daily. 30 tablet 1  ? insulin regular human CONCENTRATED (HUMULIN R) 500 UNIT/ML injection Inject 260 Units into the skin See admin instructions. 260 units 3 times daily with each meal, 100 units at bedtime    ? ivabradine (CORLANOR) 7.5 MG TABS tablet Take 1 tablet (7.5 mg total) by mouth 2 (two) times daily with a meal. 60 tablet 6  ? JENCYCLA 0.35 MG tablet Take 1 tablet  by mouth daily.    ? levothyroxine (SYNTHROID) 125 MCG tablet Take 1 tablet (125 mcg total) by mouth daily at 6 (six) AM. 30 tablet 1  ? lipase/protease/amylase (CREON) 36000 UNITS CPEP capsule Take 36,

## 2021-08-10 ENCOUNTER — Encounter (HOSPITAL_COMMUNITY): Payer: Self-pay

## 2021-08-10 ENCOUNTER — Ambulatory Visit (HOSPITAL_COMMUNITY)
Admission: RE | Admit: 2021-08-10 | Discharge: 2021-08-10 | Disposition: A | Discharge status: Home / Self Care | Payer: BC Managed Care – PPO | Source: Ambulatory Visit | Attending: Family Medicine | Admitting: Family Medicine

## 2021-08-10 VITALS — BP 98/68 | HR 113 | Wt 144.0 lb

## 2021-08-10 DIAGNOSIS — I110 Hypertensive heart disease with heart failure: Secondary | ICD-10-CM | POA: Insufficient documentation

## 2021-08-10 DIAGNOSIS — E781 Pure hyperglyceridemia: Secondary | ICD-10-CM | POA: Diagnosis not present

## 2021-08-10 DIAGNOSIS — Z86011 Personal history of benign neoplasm of the brain: Secondary | ICD-10-CM | POA: Diagnosis not present

## 2021-08-10 DIAGNOSIS — E039 Hypothyroidism, unspecified: Secondary | ICD-10-CM | POA: Diagnosis not present

## 2021-08-10 DIAGNOSIS — Z7984 Long term (current) use of oral hypoglycemic drugs: Secondary | ICD-10-CM | POA: Diagnosis not present

## 2021-08-10 DIAGNOSIS — K861 Other chronic pancreatitis: Secondary | ICD-10-CM | POA: Diagnosis not present

## 2021-08-10 DIAGNOSIS — Z794 Long term (current) use of insulin: Secondary | ICD-10-CM | POA: Diagnosis not present

## 2021-08-10 DIAGNOSIS — E119 Type 2 diabetes mellitus without complications: Secondary | ICD-10-CM | POA: Insufficient documentation

## 2021-08-10 DIAGNOSIS — I2510 Atherosclerotic heart disease of native coronary artery without angina pectoris: Secondary | ICD-10-CM | POA: Insufficient documentation

## 2021-08-10 DIAGNOSIS — R000 Tachycardia, unspecified: Secondary | ICD-10-CM

## 2021-08-10 DIAGNOSIS — Z139 Encounter for screening, unspecified: Secondary | ICD-10-CM

## 2021-08-10 DIAGNOSIS — I5022 Chronic systolic (congestive) heart failure: Secondary | ICD-10-CM | POA: Diagnosis not present

## 2021-08-10 DIAGNOSIS — R140 Abdominal distension (gaseous): Secondary | ICD-10-CM | POA: Diagnosis not present

## 2021-08-10 DIAGNOSIS — Z79899 Other long term (current) drug therapy: Secondary | ICD-10-CM | POA: Diagnosis not present

## 2021-08-10 DIAGNOSIS — K921 Melena: Secondary | ICD-10-CM | POA: Insufficient documentation

## 2021-08-10 DIAGNOSIS — I071 Rheumatic tricuspid insufficiency: Secondary | ICD-10-CM | POA: Diagnosis not present

## 2021-08-10 DIAGNOSIS — E109 Type 1 diabetes mellitus without complications: Secondary | ICD-10-CM

## 2021-08-10 DIAGNOSIS — I69854 Hemiplegia and hemiparesis following other cerebrovascular disease affecting left non-dominant side: Secondary | ICD-10-CM | POA: Diagnosis not present

## 2021-08-10 DIAGNOSIS — I428 Other cardiomyopathies: Secondary | ICD-10-CM | POA: Diagnosis not present

## 2021-08-10 DIAGNOSIS — Z79890 Hormone replacement therapy (postmenopausal): Secondary | ICD-10-CM | POA: Insufficient documentation

## 2021-08-10 DIAGNOSIS — I272 Pulmonary hypertension, unspecified: Secondary | ICD-10-CM | POA: Diagnosis not present

## 2021-08-10 LAB — CBC
HCT: 36.3 % (ref 36.0–46.0)
Hemoglobin: 12.1 g/dL (ref 12.0–15.0)
MCH: 29.9 pg (ref 26.0–34.0)
MCHC: 33.3 g/dL (ref 30.0–36.0)
MCV: 89.6 fL (ref 80.0–100.0)
Platelets: 197 10*3/uL (ref 150–400)
RBC: 4.05 MIL/uL (ref 3.87–5.11)
RDW: 15.8 % — ABNORMAL HIGH (ref 11.5–15.5)
WBC: 6.2 10*3/uL (ref 4.0–10.5)
nRBC: 0 % (ref 0.0–0.2)

## 2021-08-10 LAB — BRAIN NATRIURETIC PEPTIDE: B Natriuretic Peptide: 18.3 pg/mL (ref 0.0–100.0)

## 2021-08-10 MED ORDER — TORSEMIDE 20 MG PO TABS: 60.0000 mg | ORAL_TABLET | Freq: Two times a day (BID) | ORAL | 11 refills | Status: DC

## 2021-08-10 NOTE — Patient Instructions (Signed)
Thank you for coming in today ? ?Labs were done today, if any labs are abnormal the clinic will call you ?No news is good news ? ?INCREASE Torsemide to 60 mg 3 tablets twice daily  ? ?TAKE Metolazone 2.5 mg 08/11/2021 and 08/13/2021 with 40 meq of potassium  ? ?Your physician recommends that you schedule a follow-up appointment in:  ?3 weeks in clinic  ?09/24/2021 with Dr. Haroldine Laws ? ?At the [facility], you and your health needs are our priority. As part of our continuing mission to provide you with exceptional heart care, we have created designated Provider Care Teams. These Care Teams include your primary Cardiologist (physician) and Advanced Practice Providers (APPs- Physician Assistants and Nurse Practitioners) who all work together to provide you with the care you need, when you need it.  ? ?You may see any of the following providers on your designated Care Team at your next follow up: ?Dr Glori Bickers ?Dr Loralie Champagne ?Darrick Grinder, NP ?Lyda Jester, PA ?Jessica Milford,NP ?Marlyce Huge, PA ?Audry Riles, PharmD ? ? ?Please be sure to bring in all your medications bottles to every appointment.  ? ?If you have any questions or concerns before your next appointment please send [place] a message through Okay or call our office at [phone].   ? ?TO LEAVE A MESSAGE FOR THE NURSE SELECT OPTION 2, PLEASE LEAVE A MESSAGE INCLUDING: ?YOUR NAME ?DATE OF BIRTH ?CALL BACK NUMBER ?REASON FOR CALL**this is important as we prioritize the call backs ? ?YOU WILL RECEIVE A CALL BACK THE SAME DAY AS LONG AS YOU CALL BEFORE 4:00 PM ? ?

## 2021-08-10 NOTE — Progress Notes (Signed)
ReDS Vest / Clip - 08/10/21 1400   ? ?  ? ReDS Vest / Clip  ? Station Marker A   ? Ruler Value 26.5   ? ReDS Value Range High volume overload   ? ReDS Actual Value 43   ? ?  ?  ? ?  ? ? ?

## 2021-08-11 ENCOUNTER — Telehealth (HOSPITAL_COMMUNITY): Payer: Self-pay | Admitting: *Deleted

## 2021-08-11 ENCOUNTER — Encounter (HOSPITAL_COMMUNITY): Payer: Self-pay

## 2021-08-11 DIAGNOSIS — I5022 Chronic systolic (congestive) heart failure: Secondary | ICD-10-CM

## 2021-08-11 NOTE — Addendum Note (Signed)
Encounter addended by: Rafael Bihari, FNP on: 08/11/2021 8:15 AM ? Actions taken: Visit diagnoses modified, Clinical Note Signed

## 2021-08-11 NOTE — Telephone Encounter (Signed)
-----   Message from Rafael Bihari, [place] sent at 08/11/2021  8:40 AM EDT ----- ?CBC stable. BMET hemolyzed. She will need to go to [facility] to get a BMET asap. ?

## 2021-08-11 NOTE — Telephone Encounter (Signed)
Pt aware, agreeable, and verbalized understanding, lab order placed and also faxed to [place] at [phone] ?

## 2021-08-12 ENCOUNTER — Other Ambulatory Visit (HOSPITAL_COMMUNITY): Payer: Self-pay | Admitting: Family Medicine

## 2021-08-13 ENCOUNTER — Inpatient Hospital Stay (HOSPITAL_COMMUNITY)
Admission: EM | Admit: 2021-08-13 | Discharge: 2021-08-17 | DRG: 683 | Disposition: A | Discharge status: Home / Self Care | Payer: BC Managed Care – PPO | Attending: Family Medicine | Admitting: Family Medicine

## 2021-08-13 ENCOUNTER — Telehealth (HOSPITAL_COMMUNITY): Payer: Self-pay | Admitting: *Deleted

## 2021-08-13 ENCOUNTER — Other Ambulatory Visit: Payer: Self-pay

## 2021-08-13 ENCOUNTER — Encounter (HOSPITAL_COMMUNITY): Payer: Self-pay

## 2021-08-13 ENCOUNTER — Other Ambulatory Visit (HOSPITAL_COMMUNITY): Payer: Self-pay | Admitting: Family Medicine

## 2021-08-13 ENCOUNTER — Emergency Department (HOSPITAL_COMMUNITY): Payer: BC Managed Care – PPO

## 2021-08-13 ENCOUNTER — Other Ambulatory Visit (HOSPITAL_COMMUNITY): Payer: Self-pay | Admitting: *Deleted

## 2021-08-13 DIAGNOSIS — Z888 Allergy status to other drugs, medicaments and biological substances status: Secondary | ICD-10-CM

## 2021-08-13 DIAGNOSIS — N179 Acute kidney failure, unspecified: Principal | ICD-10-CM | POA: Diagnosis present

## 2021-08-13 DIAGNOSIS — E786 Lipoprotein deficiency: Secondary | ICD-10-CM | POA: Diagnosis present

## 2021-08-13 DIAGNOSIS — E875 Hyperkalemia: Secondary | ICD-10-CM | POA: Diagnosis present

## 2021-08-13 DIAGNOSIS — Z8349 Family history of other endocrine, nutritional and metabolic diseases: Secondary | ICD-10-CM

## 2021-08-13 DIAGNOSIS — N1832 Chronic kidney disease, stage 3b: Secondary | ICD-10-CM | POA: Diagnosis present

## 2021-08-13 DIAGNOSIS — K219 Gastro-esophageal reflux disease without esophagitis: Secondary | ICD-10-CM | POA: Diagnosis present

## 2021-08-13 DIAGNOSIS — K861 Other chronic pancreatitis: Secondary | ICD-10-CM | POA: Diagnosis present

## 2021-08-13 DIAGNOSIS — I272 Pulmonary hypertension, unspecified: Secondary | ICD-10-CM | POA: Diagnosis present

## 2021-08-13 DIAGNOSIS — I083 Combined rheumatic disorders of mitral, aortic and tricuspid valves: Secondary | ICD-10-CM | POA: Diagnosis present

## 2021-08-13 DIAGNOSIS — Z7982 Long term (current) use of aspirin: Secondary | ICD-10-CM

## 2021-08-13 DIAGNOSIS — N1831 Chronic kidney disease, stage 3a: Secondary | ICD-10-CM | POA: Diagnosis present

## 2021-08-13 DIAGNOSIS — Z91013 Allergy to seafood: Secondary | ICD-10-CM

## 2021-08-13 DIAGNOSIS — E039 Hypothyroidism, unspecified: Secondary | ICD-10-CM | POA: Diagnosis present

## 2021-08-13 DIAGNOSIS — Z79890 Hormone replacement therapy (postmenopausal): Secondary | ICD-10-CM

## 2021-08-13 DIAGNOSIS — K3184 Gastroparesis: Secondary | ICD-10-CM | POA: Diagnosis present

## 2021-08-13 DIAGNOSIS — K760 Fatty (change of) liver, not elsewhere classified: Secondary | ICD-10-CM | POA: Diagnosis present

## 2021-08-13 DIAGNOSIS — I428 Other cardiomyopathies: Secondary | ICD-10-CM

## 2021-08-13 DIAGNOSIS — Z85841 Personal history of malignant neoplasm of brain: Secondary | ICD-10-CM

## 2021-08-13 DIAGNOSIS — E871 Hypo-osmolality and hyponatremia: Secondary | ICD-10-CM | POA: Diagnosis present

## 2021-08-13 DIAGNOSIS — Z79899 Other long term (current) drug therapy: Secondary | ICD-10-CM

## 2021-08-13 DIAGNOSIS — Z885 Allergy status to narcotic agent status: Secondary | ICD-10-CM

## 2021-08-13 DIAGNOSIS — E1142 Type 2 diabetes mellitus with diabetic polyneuropathy: Secondary | ICD-10-CM | POA: Diagnosis present

## 2021-08-13 DIAGNOSIS — E860 Dehydration: Secondary | ICD-10-CM | POA: Diagnosis present

## 2021-08-13 DIAGNOSIS — Z833 Family history of diabetes mellitus: Secondary | ICD-10-CM

## 2021-08-13 DIAGNOSIS — E781 Pure hyperglyceridemia: Secondary | ICD-10-CM | POA: Diagnosis present

## 2021-08-13 DIAGNOSIS — I130 Hypertensive heart and chronic kidney disease with heart failure and stage 1 through stage 4 chronic kidney disease, or unspecified chronic kidney disease: Secondary | ICD-10-CM | POA: Diagnosis present

## 2021-08-13 DIAGNOSIS — E1122 Type 2 diabetes mellitus with diabetic chronic kidney disease: Secondary | ICD-10-CM | POA: Diagnosis present

## 2021-08-13 DIAGNOSIS — Z9221 Personal history of antineoplastic chemotherapy: Secondary | ICD-10-CM

## 2021-08-13 DIAGNOSIS — I5022 Chronic systolic (congestive) heart failure: Secondary | ICD-10-CM

## 2021-08-13 DIAGNOSIS — E1143 Type 2 diabetes mellitus with diabetic autonomic (poly)neuropathy: Secondary | ICD-10-CM | POA: Diagnosis present

## 2021-08-13 DIAGNOSIS — Z8249 Family history of ischemic heart disease and other diseases of the circulatory system: Secondary | ICD-10-CM

## 2021-08-13 DIAGNOSIS — Z880 Allergy status to penicillin: Secondary | ICD-10-CM

## 2021-08-13 DIAGNOSIS — Z794 Long term (current) use of insulin: Secondary | ICD-10-CM

## 2021-08-13 DIAGNOSIS — E282 Polycystic ovarian syndrome: Secondary | ICD-10-CM | POA: Diagnosis present

## 2021-08-13 DIAGNOSIS — E1165 Type 2 diabetes mellitus with hyperglycemia: Secondary | ICD-10-CM | POA: Diagnosis present

## 2021-08-13 DIAGNOSIS — Z881 Allergy status to other antibiotic agents status: Secondary | ICD-10-CM

## 2021-08-13 DIAGNOSIS — R7401 Elevation of levels of liver transaminase levels: Secondary | ICD-10-CM | POA: Diagnosis present

## 2021-08-13 DIAGNOSIS — Z8639 Personal history of other endocrine, nutritional and metabolic disease: Secondary | ICD-10-CM

## 2021-08-13 LAB — COMPREHENSIVE METABOLIC PANEL
ALT: 103 U/L — ABNORMAL HIGH (ref 0–44)
AST: 78 U/L — ABNORMAL HIGH (ref 15–41)
Albumin: 4.4 g/dL (ref 3.5–5.0)
Alkaline Phosphatase: 72 U/L (ref 38–126)
Anion gap: 16 — ABNORMAL HIGH (ref 5–15)
BUN: 130 mg/dL — ABNORMAL HIGH (ref 6–20)
CO2: 17 mmol/L — ABNORMAL LOW (ref 22–32)
Calcium: 9.1 mg/dL (ref 8.9–10.3)
Chloride: 98 mmol/L (ref 98–111)
Creatinine, Ser: 3.59 mg/dL — ABNORMAL HIGH (ref 0.44–1.00)
GFR, Estimated: 17 mL/min — ABNORMAL LOW (ref >60)
Glucose, Bld: 387 mg/dL — ABNORMAL HIGH (ref 70–99)
Potassium: >7.5 mmol/L (ref 3.5–5.1)
Sodium: 131 mmol/L — ABNORMAL LOW (ref 135–145)
Total Bilirubin: 0.6 mg/dL (ref 0.3–1.2)
Total Protein: 8.3 g/dL — ABNORMAL HIGH (ref 6.5–8.1)

## 2021-08-13 LAB — BASIC METABOLIC PANEL
BUN/Creatinine Ratio: 35 — ABNORMAL HIGH (ref 9–23)
BUN: 110 mg/dL (ref 6–20)
CO2: 14 mmol/L — ABNORMAL LOW (ref 20–29)
Calcium: 10.6 mg/dL — ABNORMAL HIGH (ref 8.7–10.2)
Chloride: 91 mmol/L — ABNORMAL LOW (ref 96–106)
Creatinine, Ser: 3.18 mg/dL — ABNORMAL HIGH (ref 0.57–1.00)
Glucose: 170 mg/dL — ABNORMAL HIGH (ref 70–99)
Potassium: 5.8 mmol/L — ABNORMAL HIGH (ref 3.5–5.2)
Sodium: 131 mmol/L — ABNORMAL LOW (ref 134–144)
eGFR: 19 mL/min/{1.73_m2} — ABNORMAL LOW (ref >59)

## 2021-08-13 LAB — BRAIN NATRIURETIC PEPTIDE: B Natriuretic Peptide: 11.6 pg/mL (ref 0.0–100.0)

## 2021-08-13 LAB — SPECIMEN STATUS REPORT

## 2021-08-13 MED ORDER — LOKELMA 10 G PO PACK
10.0000 g | PACK | Freq: Once | ORAL | 0 refills | Status: AC
Start: 2021-08-13 — End: 2021-08-13

## 2021-08-13 MED ORDER — CALCIUM GLUCONATE-NACL 1-0.675 GM/50ML-% IV SOLN
1.0000 g | Freq: Once | INTRAVENOUS | Status: AC
  Administered 2021-08-13: 1000 mg via INTRAVENOUS
  Filled 2021-08-13: qty 50

## 2021-08-13 MED ORDER — SODIUM ZIRCONIUM CYCLOSILICATE 10 G PO PACK
10.0000 g | PACK | ORAL | Status: AC
  Administered 2021-08-13: 10 g via ORAL
  Filled 2021-08-13: qty 1

## 2021-08-13 MED ORDER — TORSEMIDE 20 MG PO TABS: 40.0000 mg | ORAL_TABLET | Freq: Two times a day (BID) | ORAL | 11 refills | Status: DC

## 2021-08-13 MED ORDER — INSULIN ASPART 100 UNIT/ML IV SOLN
5.0000 [IU] | Freq: Once | INTRAVENOUS | Status: AC
  Administered 2021-08-13: 5 [IU] via INTRAVENOUS
  Filled 2021-08-13: qty 0.05

## 2021-08-13 NOTE — ED Provider Notes (Signed)
[facility] DEPT Provider Note   CSN: [id number] Arrival date & time: 08/13/21  1725     History  Chief Complaint  Patient presents with   Nausea   Tingling    Jennifer [name] is a 31 y.o. female.  Patient presents to the emergency department with nausea, tingling, generalized numbness and shortness of breath.  Patient has a history of congestive heart failure.  She was seen by cardiology within the last week and had medications adjusted and Zaroxolyn added to her regimen for increased volume overload.  Patient was called by her cardiologist and told to stop all of her diuretics, Entresto because of kidney strain.  She did not take her medications this morning.      Home Medications Prior to Admission medications   Medication Sig Start Date End Date Taking? Authorizing Provider  albuterol (VENTOLIN HFA) 108 (90 Base) MCG/ACT inhaler Inhale 2 puffs into the lungs every 6 (six) hours as needed for wheezing or shortness of breath. 12/31/18   [provider]  aspirin EC 81 MG EC tablet Take 1 tablet (81 mg total) by mouth daily. Swallow whole. 12/07/20   Ezekiel Slocumb, DO  carvedilol (COREG) 6.25 MG tablet Take 1 tablet (6.25 mg total) by mouth 2 (two) times daily. 03/11/21   Shelly Coss, MD  cetirizine (ZYRTEC) 10 MG tablet Take 10 mg by mouth daily.    [provider]  DULoxetine (CYMBALTA) 30 MG capsule Take 30 mg by mouth at bedtime. 01/28/19 12/16/21  [provider]  Evolocumab (REPATHA) 140 MG/ML SOSY Inject 140 mg into the skin See admin instructions. Inject 140 mg subcutaneously every other Sunday evening    [provider]  fenofibrate 160 MG tablet Take 1 tablet (160 mg total) by mouth daily. 12/07/20   Nicole Kindred A, DO  insulin regular human CONCENTRATED (HUMULIN R) 500 UNIT/ML injection Inject 260 Units into the skin See admin instructions. 260 units 3 times daily with each meal, 100 units at bedtime     [provider]  ivabradine (CORLANOR) 7.5 MG TABS tablet Take 1 tablet (7.5 mg total) by mouth 2 (two) times daily with a meal. 08/02/21   [name], Shaune Pascal, MD  JENCYCLA 0.35 MG tablet Take 1 tablet by mouth daily. 07/24/21   [provider]  levothyroxine (SYNTHROID) 125 MCG tablet Take 1 tablet (125 mcg total) by mouth daily at 6 (six) AM. 12/07/20   Ezekiel Slocumb, DO  lipase/protease/amylase (CREON) 36000 UNITS CPEP capsule Take 45,364-68,032 Units by mouth See admin instructions. 72,000 units with each meal, 36,000 units with snacks    [provider]  Multiple Vitamin (MULTIVITAMIN WITH MINERALS) TABS tablet Take 1 tablet by mouth daily. 12/07/20   Ezekiel Slocumb, DO  nitroGLYCERIN (NITROSTAT) 0.4 MG SL tablet Place 1 tablet (0.4 mg total) under the tongue every 5 (five) minutes as needed for up to 25 days for chest pain. 06/16/21 08/03/22  [name], Shaune Pascal, MD  ofloxacin (OCUFLOX) 0.3 % ophthalmic solution Place 1 drop into both eyes in the morning, at noon, in the evening, and at bedtime. 09/02/20   [provider]  ondansetron (ZOFRAN) 4 MG tablet Take 4 mg by mouth every 8 (eight) hours as needed for nausea or vomiting. 12/19/18   [provider]  pantoprazole (PROTONIX) 40 MG tablet Take 40 mg by mouth daily. 12/24/19   [provider]  pregabalin (LYRICA) 100 MG capsule Take 1-2 capsules (100-200 mg total)  by mouth See admin instructions. 200 mg in the morning, 100 mg in the afternnon 08/05/21   [name], Shaune Pascal, MD  pregabalin (LYRICA) 300 MG capsule Take 300 mg by mouth at bedtime.    [provider]  prochlorperazine (COMPAZINE) 10 MG tablet Take 1 tablet (10 mg total) by mouth every 6 (six) hours as needed for nausea or vomiting. 01/18/21   Molpus, John, MD  promethazine (PHENERGAN) 25 MG tablet Take 25 mg by mouth every 6 (six) hours as needed for vomiting or nausea. 03/26/21   [provider]  sodium  zirconium cyclosilicate (LOKELMA) 10 g PACK packet Take 10 g by mouth once for 1 dose. 08/13/21 08/13/21  Rafael Bihari, FNP  torsemide (DEMADEX) 20 MG tablet Take 2 tablets (40 mg total) by mouth 2 (two) times daily. 08/13/21   Milford, Maricela Bo, FNP  tretinoin (RETIN-A) 0.05 % cream Apply 1 application topically at bedtime. 01/17/21   [provider]      Allergies    Ketamine, Clarithromycin, Fentanyl, Maitake mushroom [maitake], Penicillin v, Shellfish allergy, Morphine, Penicillins, and Prednisone    Review of Systems   Review of Systems  Physical Exam Updated Vital Signs BP 115/80   Pulse (!) 111   Temp 97.9 F (36.6 C) (Oral)   Resp 19   Ht 4' 9"  (1.448 m)   Wt 64 kg   LMP 05/16/2021 (Exact Date)   SpO2 100%   BMI 30.51 kg/m  Physical Exam Vitals and nursing note reviewed.  Constitutional:      General: [name] not in acute distress.    Appearance: [name] well-developed.  HENT:     Head: Normocephalic and atraumatic.     Mouth/Throat:     Mouth: Mucous membranes are moist.  Eyes:     General: Vision grossly intact. Gaze aligned appropriately.     Extraocular Movements: Extraocular movements intact.     Conjunctiva/sclera: Conjunctivae normal.  Cardiovascular:     Rate and Rhythm: Normal rate and regular rhythm.     Pulses: Normal pulses.     Heart sounds: Normal heart sounds, S1 normal and S2 normal. No murmur heard.   No friction rub. No gallop.  Pulmonary:     Effort: Pulmonary effort is normal. No respiratory distress.     Breath sounds: Normal breath sounds.  Abdominal:     General: Bowel sounds are normal.     Palpations: Abdomen is soft.     Tenderness: There is no abdominal tenderness. There is no guarding or rebound.     Hernia: No hernia is present.  Musculoskeletal:        General: No swelling.     Cervical back: Full passive range of motion without pain, normal range of motion and neck supple. No spinous process tenderness or muscular  tenderness. Normal range of motion.     Right lower leg: No edema.     Left lower leg: No edema.  Skin:    General: Skin is warm and dry.     Capillary Refill: Capillary refill takes less than 2 seconds.     Findings: No ecchymosis, erythema, rash or wound.  Neurological:     General: No focal deficit present.     Mental Status: [name] alert and oriented to person, place, and time.     GCS: GCS eye subscore is 4. GCS verbal subscore is 5. GCS motor subscore is 6.     Cranial Nerves: Cranial nerves 2-12  are intact.     Sensory: Sensation is intact.     Motor: Motor function is intact.     Coordination: Coordination is intact.  Psychiatric:        Attention and Perception: Attention normal.        Mood and Affect: Mood normal.        Speech: Speech normal.        Behavior: Behavior normal.    ED Results / Procedures / Treatments   Labs (all labs ordered are listed, but only abnormal results are displayed) Labs Reviewed  COMPREHENSIVE METABOLIC PANEL - Abnormal; Notable for the following components:      Result Value   Sodium 131 (*)    Potassium >7.5 (*)    CO2 17 (*)    Glucose, Bld 387 (*)    BUN 130 (*)    Creatinine, Ser 3.59 (*)    Total Protein 8.3 (*)    AST 78 (*)    ALT 103 (*)    GFR, Estimated 17 (*)    Anion gap 16 (*)    All other components within normal limits  BRAIN NATRIURETIC PEPTIDE    EKG EKG Interpretation  Date/Time:  Friday Aug 13 2021 19:48:49 EDT Ventricular Rate:  101 PR Interval:  127 QRS Duration: 106 QT Interval:  355 QTC Calculation: 461 R Axis:   8 Text Interpretation: Sinus tachycardia ST elev, probable normal early repol pattern Confirmed by Orpah Greek [phone]) on 08/13/2021 10:51:37 PM  Radiology DG Chest 2 View  Result Date: 08/13/2021 CLINICAL DATA:  Shortness of breath, CHF EXAM: CHEST - 2 VIEW COMPARISON:  05/13/2021 FINDINGS: Cardiac and mediastinal contours are within normal limits given AP technique. No focal  pulmonary opacity. No pleural effusion or pneumothorax. No acute osseous abnormality. IMPRESSION: No acute cardiopulmonary process. Electronically Signed   By: Merilyn Baba M.D.   On: 08/13/2021 19:25    Procedures Procedures    Medications Ordered in ED Medications  calcium gluconate 1 g/ 50 mL sodium chloride IVPB (1,000 mg Intravenous New Bag/Given 08/13/21 2353)  sodium zirconium cyclosilicate (LOKELMA) packet 10 g (10 g Oral Given 08/13/21 2349)  insulin aspart (novoLOG) injection 5 Units (5 Units Intravenous Given 08/13/21 2350)    ED Course/ Medical Decision Making/ A&P                           Medical Decision Making Problems Addressed: AKI (acute kidney injury) [facility]): acute illness or injury that poses a threat to life or bodily functions Hyperkalemia: acute illness or injury that poses a threat to life or bodily functions  Amount and/or Complexity of Data Reviewed External Data Reviewed: labs, radiology, ECG and notes. Labs: ordered. Decision-making details documented in ED Course. Radiology: ordered and independent interpretation performed. Decision-making details documented in ED Course. ECG/medicine tests: ordered and independent interpretation performed. Decision-making details documented in ED Course.  Risk OTC drugs. Prescription drug management.  Critical Care Total time providing critical care: 32 minutes  Patient referred to the emergency department after she was noted to have significant renal insufficiency on outside labs.  Patient with a very complex past medical history including history of congestive heart failure. She had diuresis increased earlier this week and then was found to have worsening renal insufficiency.  She has not taken her torsemide, Entresto, spironolactone, Zaroxolyn today.  Patient found to be hyperkalemic with significantly elevated BUN and creatinine.  Hyperkalemia treated acutely in  the emergency department, will require hospitalization  for further management  CRITICAL CARE Performed by: Orpah Greek   Total critical care time: 32 minutes  Critical care time was exclusive of separately billable procedures and treating other patients.  Critical care was necessary to treat or prevent imminent or life-threatening deterioration.  Critical care was time spent personally by me on the following activities: development of treatment plan with patient and/or surrogate as well as nursing, discussions with consultants, evaluation of patient's response to treatment, examination of patient, obtaining history from patient or surrogate, ordering and performing treatments and interventions, ordering and review of laboratory studies, ordering and review of radiographic studies, pulse oximetry and re-evaluation of patient's condition.        Final Clinical Impression(s) / ED Diagnoses Final diagnoses:  AKI (acute kidney injury) ([place])  Hyperkalemia    Rx / DC Orders ED Discharge Orders     None         Ellaina Schuler, Gwenyth Allegra, MD 08/13/21 2354

## 2021-08-13 NOTE — ED Provider Triage Note (Signed)
Emergency Medicine Provider Triage Evaluation Note  Jennifer [name] , a 31 y.o. female  was evaluated in triage.  Pt complains of nausea, tingling and numbness along with shortness of breath for the last 4 days.  Patient reports she has CHF, recently had labs drawn which showed decreased kidney function and increased creatinine.  Patient recently had right heart catheterization done.  Patient also complaining orthopnea.  Review of Systems  Positive:  Negative:   Physical Exam  BP 129/80 (BP Location: Right Arm)   Pulse (!) 105   Temp 97.9 F (36.6 C) (Oral)   Resp 16   Ht 4' 9"  (1.448 m)   Wt 64 kg   LMP 05/16/2021 (Exact Date)   SpO2 98%   BMI 30.51 kg/m  Gen:   Awake, no distress   Resp:  Normal effort  MSK:   Moves extremities without difficulty  Other:  Distended abdomen.  Lung sounds clear.  No lower extremity swelling.  Medical Decision Making  Medically screening exam initiated at 6:08 PM.  Appropriate orders placed.  Jennifer [name] was informed that the remainder of the evaluation will be completed by another provider, this initial triage assessment does not replace that evaluation, and the importance of remaining in the ED until their evaluation is complete.     Jennifer Cecil, PA-C 08/13/21 1809

## 2021-08-13 NOTE — ED Triage Notes (Signed)
Patient c/o muscle weakness, tingling, SOB. Patient reports a history of CHF. Patient states she was told to stop her kidney meds today due to elevated BUN and creatinine. BUN-110 and creat-3.18, K-5.8

## 2021-08-13 NOTE — Telephone Encounter (Signed)
Harvie Junior, [place]  08/13/2021  2:35 PM EDT Back to Top    Pt returned call she is aware of results and medication changes. Pt uses [facility] on [address]. Order in epic and faxed to [phone].    [place], RN  08/13/2021  2:16 PM EDT     Left message to call back     Rafael Bihari, FNP  08/13/2021 11:57 AM EDT     Please have her take a dose of Lokelma 10 g x 1 today   [place], [place]  08/13/2021 11:09 AM EDT     Labs suggest AKI and elevated K. Please ensure labs are not hemolyzed   Stop metolazone, KCL, Entresto and spiro.    Decrease torsemide to 40 mg bid.   She needs repeat BMET today and Monday.

## 2021-08-13 NOTE — Telephone Encounter (Signed)
-----   Message from Rafael Bihari, [place] sent at 08/13/2021 11:57 AM EDT ----- Please have her take a dose of Lokelma 10 g x 1 today

## 2021-08-14 ENCOUNTER — Encounter (HOSPITAL_COMMUNITY): Payer: Self-pay | Admitting: Family Medicine

## 2021-08-14 ENCOUNTER — Inpatient Hospital Stay (HOSPITAL_COMMUNITY): Payer: BC Managed Care – PPO

## 2021-08-14 DIAGNOSIS — K3184 Gastroparesis: Secondary | ICD-10-CM | POA: Diagnosis present

## 2021-08-14 DIAGNOSIS — K219 Gastro-esophageal reflux disease without esophagitis: Secondary | ICD-10-CM | POA: Diagnosis present

## 2021-08-14 DIAGNOSIS — E875 Hyperkalemia: Secondary | ICD-10-CM | POA: Diagnosis present

## 2021-08-14 DIAGNOSIS — E786 Lipoprotein deficiency: Secondary | ICD-10-CM | POA: Diagnosis present

## 2021-08-14 DIAGNOSIS — E1142 Type 2 diabetes mellitus with diabetic polyneuropathy: Secondary | ICD-10-CM | POA: Diagnosis present

## 2021-08-14 DIAGNOSIS — Z7982 Long term (current) use of aspirin: Secondary | ICD-10-CM | POA: Diagnosis not present

## 2021-08-14 DIAGNOSIS — I5022 Chronic systolic (congestive) heart failure: Secondary | ICD-10-CM | POA: Diagnosis present

## 2021-08-14 DIAGNOSIS — N179 Acute kidney failure, unspecified: Secondary | ICD-10-CM | POA: Diagnosis present

## 2021-08-14 DIAGNOSIS — I272 Pulmonary hypertension, unspecified: Secondary | ICD-10-CM | POA: Diagnosis present

## 2021-08-14 DIAGNOSIS — E1143 Type 2 diabetes mellitus with diabetic autonomic (poly)neuropathy: Secondary | ICD-10-CM | POA: Diagnosis present

## 2021-08-14 DIAGNOSIS — I428 Other cardiomyopathies: Secondary | ICD-10-CM | POA: Diagnosis present

## 2021-08-14 DIAGNOSIS — N1831 Chronic kidney disease, stage 3a: Secondary | ICD-10-CM | POA: Diagnosis present

## 2021-08-14 DIAGNOSIS — E039 Hypothyroidism, unspecified: Secondary | ICD-10-CM | POA: Diagnosis present

## 2021-08-14 DIAGNOSIS — E781 Pure hyperglyceridemia: Secondary | ICD-10-CM | POA: Diagnosis present

## 2021-08-14 DIAGNOSIS — E871 Hypo-osmolality and hyponatremia: Secondary | ICD-10-CM | POA: Diagnosis present

## 2021-08-14 DIAGNOSIS — I130 Hypertensive heart and chronic kidney disease with heart failure and stage 1 through stage 4 chronic kidney disease, or unspecified chronic kidney disease: Secondary | ICD-10-CM | POA: Diagnosis present

## 2021-08-14 DIAGNOSIS — R7401 Elevation of levels of liver transaminase levels: Secondary | ICD-10-CM

## 2021-08-14 DIAGNOSIS — Z8639 Personal history of other endocrine, nutritional and metabolic disease: Secondary | ICD-10-CM

## 2021-08-14 DIAGNOSIS — E1122 Type 2 diabetes mellitus with diabetic chronic kidney disease: Secondary | ICD-10-CM | POA: Diagnosis present

## 2021-08-14 DIAGNOSIS — E1165 Type 2 diabetes mellitus with hyperglycemia: Secondary | ICD-10-CM | POA: Diagnosis present

## 2021-08-14 DIAGNOSIS — K760 Fatty (change of) liver, not elsewhere classified: Secondary | ICD-10-CM | POA: Diagnosis present

## 2021-08-14 DIAGNOSIS — I5043 Acute on chronic combined systolic (congestive) and diastolic (congestive) heart failure: Secondary | ICD-10-CM | POA: Diagnosis not present

## 2021-08-14 DIAGNOSIS — E282 Polycystic ovarian syndrome: Secondary | ICD-10-CM | POA: Diagnosis present

## 2021-08-14 DIAGNOSIS — K861 Other chronic pancreatitis: Secondary | ICD-10-CM | POA: Diagnosis present

## 2021-08-14 DIAGNOSIS — I5021 Acute systolic (congestive) heart failure: Secondary | ICD-10-CM | POA: Diagnosis not present

## 2021-08-14 DIAGNOSIS — I083 Combined rheumatic disorders of mitral, aortic and tricuspid valves: Secondary | ICD-10-CM | POA: Diagnosis present

## 2021-08-14 DIAGNOSIS — Z794 Long term (current) use of insulin: Secondary | ICD-10-CM | POA: Diagnosis not present

## 2021-08-14 DIAGNOSIS — E860 Dehydration: Secondary | ICD-10-CM | POA: Diagnosis present

## 2021-08-14 LAB — URINALYSIS, COMPLETE (UACMP) WITH MICROSCOPIC
Bacteria, UA: NONE SEEN
Bilirubin Urine: NEGATIVE
Glucose, UA: NEGATIVE mg/dL
Hgb urine dipstick: NEGATIVE
Ketones, ur: NEGATIVE mg/dL
Leukocytes,Ua: NEGATIVE
Nitrite: NEGATIVE
Protein, ur: NEGATIVE mg/dL
Specific Gravity, Urine: 1.009 (ref 1.005–1.030)
pH: 5 (ref 5.0–8.0)

## 2021-08-14 LAB — CBC
HCT: 36.8 % (ref 36.0–46.0)
Hemoglobin: 12.4 g/dL (ref 12.0–15.0)
MCH: 29.7 pg (ref 26.0–34.0)
MCHC: 33.7 g/dL (ref 30.0–36.0)
MCV: 88.2 fL (ref 80.0–100.0)
Platelets: 153 10*3/uL (ref 150–400)
RBC: 4.17 MIL/uL (ref 3.87–5.11)
RDW: 15.7 % — ABNORMAL HIGH (ref 11.5–15.5)
WBC: 6 10*3/uL (ref 4.0–10.5)
nRBC: 0 % (ref 0.0–0.2)

## 2021-08-14 LAB — HEPATIC FUNCTION PANEL
ALT: 87 U/L — ABNORMAL HIGH (ref 0–44)
AST: 59 U/L — ABNORMAL HIGH (ref 15–41)
Albumin: 4.2 g/dL (ref 3.5–5.0)
Alkaline Phosphatase: 79 U/L (ref 38–126)
Bilirubin, Direct: 0.2 mg/dL (ref 0.0–0.2)
Indirect Bilirubin: 0.4 mg/dL (ref 0.3–0.9)
Total Bilirubin: 0.6 mg/dL (ref 0.3–1.2)
Total Protein: 7.9 g/dL (ref 6.5–8.1)

## 2021-08-14 LAB — BASIC METABOLIC PANEL
Anion gap: 15 (ref 5–15)
Anion gap: 18 — ABNORMAL HIGH (ref 5–15)
Anion gap: 14 (ref 5–15)
BUN: 130 mg/dL — ABNORMAL HIGH (ref 6–20)
BUN: 131 mg/dL — ABNORMAL HIGH (ref 6–20)
BUN: 129 mg/dL — ABNORMAL HIGH (ref 6–20)
CO2: 18 mmol/L — ABNORMAL LOW (ref 22–32)
CO2: 19 mmol/L — ABNORMAL LOW (ref 22–32)
CO2: 18 mmol/L — ABNORMAL LOW (ref 22–32)
Calcium: 9.6 mg/dL (ref 8.9–10.3)
Calcium: 9.8 mg/dL (ref 8.9–10.3)
Calcium: 8.5 mg/dL — ABNORMAL LOW (ref 8.9–10.3)
Chloride: 96 mmol/L — ABNORMAL LOW (ref 98–111)
Chloride: 97 mmol/L — ABNORMAL LOW (ref 98–111)
Chloride: 97 mmol/L — ABNORMAL LOW (ref 98–111)
Creatinine, Ser: 3.42 mg/dL — ABNORMAL HIGH (ref 0.44–1.00)
Creatinine, Ser: 3.44 mg/dL — ABNORMAL HIGH (ref 0.44–1.00)
Creatinine, Ser: 2.92 mg/dL — ABNORMAL HIGH (ref 0.44–1.00)
GFR, Estimated: 18 mL/min — ABNORMAL LOW (ref >60)
GFR, Estimated: 18 mL/min — ABNORMAL LOW (ref >60)
GFR, Estimated: 21 mL/min — ABNORMAL LOW (ref >60)
Glucose, Bld: 261 mg/dL — ABNORMAL HIGH (ref 70–99)
Glucose, Bld: 264 mg/dL — ABNORMAL HIGH (ref 70–99)
Glucose, Bld: 461 mg/dL — ABNORMAL HIGH (ref 70–99)
Potassium: 4.8 mmol/L (ref 3.5–5.1)
Potassium: 4.9 mmol/L (ref 3.5–5.1)
Potassium: 5.6 mmol/L — ABNORMAL HIGH (ref 3.5–5.1)
Sodium: 130 mmol/L — ABNORMAL LOW (ref 135–145)
Sodium: 133 mmol/L — ABNORMAL LOW (ref 135–145)
Sodium: 129 mmol/L — ABNORMAL LOW (ref 135–145)

## 2021-08-14 LAB — GLUCOSE, CAPILLARY
Glucose-Capillary: 289 mg/dL — ABNORMAL HIGH (ref 70–99)
Glucose-Capillary: 357 mg/dL — ABNORMAL HIGH (ref 70–99)
Glucose-Capillary: 343 mg/dL — ABNORMAL HIGH (ref 70–99)
Glucose-Capillary: 406 mg/dL — ABNORMAL HIGH (ref 70–99)

## 2021-08-14 LAB — BASIC METABOLIC PANEL WITH GFR
BUN/Creatinine Ratio: 38 — ABNORMAL HIGH (ref 9–23)
BUN: 104 mg/dL (ref 6–20)
CO2: 15 mmol/L — ABNORMAL LOW (ref 20–29)
Calcium: 9.4 mg/dL (ref 8.7–10.2)
Chloride: 92 mmol/L — ABNORMAL LOW (ref 96–106)
Creatinine, Ser: 2.76 mg/dL — ABNORMAL HIGH (ref 0.57–1.00)
Glucose: 406 mg/dL — ABNORMAL HIGH (ref 70–99)
Potassium: 7.2 mmol/L (ref 3.5–5.2)
Sodium: 128 mmol/L — ABNORMAL LOW (ref 134–144)
eGFR: 23 mL/min/{1.73_m2} — ABNORMAL LOW

## 2021-08-14 LAB — POTASSIUM
Potassium: 5.3 mmol/L — ABNORMAL HIGH (ref 3.5–5.1)
Potassium: 6.6 mmol/L (ref 3.5–5.1)
Potassium: 7 mmol/L (ref 3.5–5.1)

## 2021-08-14 LAB — CREATININE, URINE, RANDOM: Creatinine, Urine: 59.86 mg/dL

## 2021-08-14 LAB — HEPATITIS PANEL, ACUTE
HCV Ab: NONREACTIVE
Hep A IgM: NONREACTIVE
Hep B C IgM: NONREACTIVE
Hepatitis B Surface Ag: NONREACTIVE

## 2021-08-14 LAB — SODIUM, URINE, RANDOM: Sodium, Ur: 99 mmol/L

## 2021-08-14 MED ORDER — ALBUTEROL SULFATE (2.5 MG/3ML) 0.083% IN NEBU: 2.5000 mg | INHALATION_SOLUTION | Freq: Four times a day (QID) | RESPIRATORY_TRACT | Status: DC | PRN

## 2021-08-14 MED ORDER — FENOFIBRATE 160 MG PO TABS
160.0000 mg | ORAL_TABLET | Freq: Every day | ORAL | Status: DC
  Administered 2021-08-15 – 2021-08-17 (×3): 160 mg via ORAL
  Filled 2021-08-14 (×3): qty 1

## 2021-08-14 MED ORDER — INSULIN ASPART 100 UNIT/ML IJ SOLN
10.0000 [IU] | Freq: Three times a day (TID) | INTRAMUSCULAR | Status: DC
  Administered 2021-08-14 – 2021-08-15 (×3): 10 [IU] via SUBCUTANEOUS

## 2021-08-14 MED ORDER — ACETAMINOPHEN 650 MG RE SUPP
650.0000 mg | Freq: Four times a day (QID) | RECTAL | Status: DC | PRN
Start: 2021-08-14 — End: 2021-08-17

## 2021-08-14 MED ORDER — ONDANSETRON HCL 4 MG/2ML IJ SOLN: 4.0000 mg | Freq: Four times a day (QID) | INTRAMUSCULAR | Status: DC | PRN

## 2021-08-14 MED ORDER — LEVOTHYROXINE SODIUM 25 MCG PO TABS
125.0000 ug | ORAL_TABLET | Freq: Every day | ORAL | Status: DC
  Administered 2021-08-15 – 2021-08-17 (×3): 125 ug via ORAL
  Filled 2021-08-14 (×4): qty 1

## 2021-08-14 MED ORDER — SODIUM CHLORIDE 0.9% FLUSH
3.0000 mL | Freq: Two times a day (BID) | INTRAVENOUS | Status: DC
  Administered 2021-08-14 – 2021-08-17 (×3): 3 mL via INTRAVENOUS

## 2021-08-14 MED ORDER — INSULIN ASPART 100 UNIT/ML IV SOLN
10.0000 [IU] | Freq: Once | INTRAVENOUS | Status: AC
  Administered 2021-08-14: 10 [IU] via INTRAVENOUS

## 2021-08-14 MED ORDER — INSULIN ASPART 100 UNIT/ML IJ SOLN
0.0000 [IU] | Freq: Three times a day (TID) | INTRAMUSCULAR | Status: DC
  Administered 2021-08-14: 5 [IU] via SUBCUTANEOUS
  Administered 2021-08-14: 3 [IU] via SUBCUTANEOUS
  Administered 2021-08-15: 4 [IU] via SUBCUTANEOUS

## 2021-08-14 MED ORDER — HEPARIN SODIUM (PORCINE) 5000 UNIT/ML IJ SOLN
5000.0000 [IU] | Freq: Three times a day (TID) | INTRAMUSCULAR | Status: DC
  Administered 2021-08-14 – 2021-08-17 (×10): 5000 [IU] via SUBCUTANEOUS
  Filled 2021-08-14 (×8): qty 1

## 2021-08-14 MED ORDER — SODIUM CHLORIDE 0.9 % IV SOLN: INTRAVENOUS | Status: DC

## 2021-08-14 MED ORDER — DEXTROSE 50 % IV SOLN
1.0000 | Freq: Once | INTRAVENOUS | Status: AC
  Administered 2021-08-14: 50 mL via INTRAVENOUS
  Filled 2021-08-14: qty 50

## 2021-08-14 MED ORDER — STERILE WATER FOR INJECTION IV SOLN
INTRAVENOUS | Status: DC
  Filled 2021-08-14: qty 1000

## 2021-08-14 MED ORDER — PANTOPRAZOLE SODIUM 40 MG PO TBEC
40.0000 mg | DELAYED_RELEASE_TABLET | Freq: Every day | ORAL | Status: DC
  Administered 2021-08-14 – 2021-08-17 (×4): 40 mg via ORAL
  Filled 2021-08-14 (×4): qty 1

## 2021-08-14 MED ORDER — INSULIN ASPART 100 UNIT/ML IJ SOLN
0.0000 [IU] | Freq: Every day | INTRAMUSCULAR | Status: DC
  Administered 2021-08-14: 5 [IU] via SUBCUTANEOUS

## 2021-08-14 MED ORDER — INSULIN GLARGINE-YFGN 100 UNIT/ML ~~LOC~~ SOLN
75.0000 [IU] | Freq: Two times a day (BID) | SUBCUTANEOUS | Status: DC
  Administered 2021-08-14 (×2): 75 [IU] via SUBCUTANEOUS
  Filled 2021-08-14 (×4): qty 0.75

## 2021-08-14 MED ORDER — NITROGLYCERIN 0.4 MG SL SUBL
0.4000 mg | SUBLINGUAL_TABLET | SUBLINGUAL | Status: DC | PRN
  Administered 2021-08-14 – 2021-08-15 (×3): 0.4 mg via SUBLINGUAL
  Filled 2021-08-14 (×4): qty 1

## 2021-08-14 MED ORDER — DULOXETINE HCL 30 MG PO CPEP
30.0000 mg | ORAL_CAPSULE | Freq: Every day | ORAL | Status: DC
  Administered 2021-08-14 – 2021-08-16 (×3): 30 mg via ORAL
  Filled 2021-08-14 (×3): qty 1

## 2021-08-14 MED ORDER — ASPIRIN 81 MG PO TBEC
81.0000 mg | DELAYED_RELEASE_TABLET | Freq: Every day | ORAL | Status: DC
  Administered 2021-08-14 – 2021-08-17 (×4): 81 mg via ORAL
  Filled 2021-08-14 (×4): qty 1

## 2021-08-14 MED ORDER — CARVEDILOL 6.25 MG PO TABS
6.2500 mg | ORAL_TABLET | Freq: Two times a day (BID) | ORAL | Status: DC
  Administered 2021-08-14 – 2021-08-17 (×6): 6.25 mg via ORAL
  Filled 2021-08-14 (×6): qty 1

## 2021-08-14 MED ORDER — PREGABALIN 75 MG PO CAPS
150.0000 mg | ORAL_CAPSULE | Freq: Every day | ORAL | Status: DC
  Administered 2021-08-14 – 2021-08-16 (×3): 150 mg via ORAL
  Filled 2021-08-14 (×3): qty 2

## 2021-08-14 MED ORDER — ONDANSETRON HCL 4 MG PO TABS: 4.0000 mg | ORAL_TABLET | Freq: Four times a day (QID) | ORAL | Status: DC | PRN

## 2021-08-14 MED ORDER — ACETAMINOPHEN 325 MG PO TABS
650.0000 mg | ORAL_TABLET | Freq: Four times a day (QID) | ORAL | Status: DC | PRN
  Administered 2021-08-14: 650 mg via ORAL
  Filled 2021-08-14: qty 2

## 2021-08-14 MED ORDER — SODIUM ZIRCONIUM CYCLOSILICATE 10 G PO PACK
10.0000 g | PACK | Freq: Three times a day (TID) | ORAL | Status: DC
  Administered 2021-08-14 – 2021-08-15 (×2): 10 g via ORAL
  Filled 2021-08-14 (×2): qty 1

## 2021-08-14 MED ORDER — ALBUTEROL SULFATE HFA 108 (90 BASE) MCG/ACT IN AERS: 2.0000 | INHALATION_SPRAY | Freq: Four times a day (QID) | RESPIRATORY_TRACT | Status: DC | PRN

## 2021-08-14 NOTE — H&P (Signed)
History and Physical    Jennifer [name] DQQ:[id number] DOB: [birth year] DOA: 08/13/2021  PCP: Sue Lush, PA-C   Patient coming from: Home   Chief Complaint: General weakness, fatigue   HPI: Jennifer [name] is a pleasant 31 y.o. female with medical history significant for astrocytoma status postresection and chemotherapy, hypertriglyceridemia, chronic pancreatitis, thyroidism, CKD 3A, insulin-dependent diabetes mellitus, and nonischemic cardiomyopathy with EF 45%, now presenting to the emergency department with general weakness and fatigue.  Patient recently increased her torsemide, metolazone, and potassium supplement, had blood work concerning for acute renal failure and hyperkalemia, and began holding her diuretics and potassium yesterday.  She denies chest pain, cough, or shortness of breath at rest but has had some orthopnea for the past 1 to 2 months.  She denies any leg swelling, notes that her legs never seem to swell, but has noticed increased abdominal girth over the past few weeks.  ED Course: Upon arrival to the ED, patient is found to be afebrile and saturating well on room air with slightly elevated heart rate and stable blood pressure.  EKG features sinus tachycardia with rate under 1 and peaked T waves.  Chest x-ray negative for acute cardiopulmonary disease.  Chemistry panel notable for glucose 387, potassium >7.5, bicarbonate 17, BUN 130, creatinine 3.59, and mild elevation in transaminases.  BNP was 12.  Patient was treated with Lokelma, insulin, and IV calcium gluconate in the ED.  Review of Systems:  All other systems reviewed and apart from HPI, are negative.  Past Medical History:  Diagnosis Date   Brain tumor ([place]) 03/29/1995   astrocytoma   CHF (congestive heart failure) (HCC)    Cholesterosis    DM (diabetes mellitus) ([place]) 10/10/2018   Fatty liver    HTN (hypertension) 10/10/2018   Hypothyroidism 10/10/2018   Lipoprotein deficiency    Lung disease     longevity long term   Pancreatitis    Polycystic ovary syndrome     Past Surgical History:  Procedure Laterality Date   ABDOMINAL SURGERY     pt states during miscarriage got her intestine   BRAIN SURGERY     EYE MUSCLE SURGERY Right 03/28/2014   RIGHT HEART CATH N/A 08/05/2021   Procedure: RIGHT HEART CATH;  Surgeon: [name], MD;  Location: [facility];  Service: Cardiovascular;  Laterality: N/A;   RIGHT/LEFT HEART CATH AND CORONARY ANGIOGRAPHY N/A 12/03/2020   Procedure: RIGHT/LEFT HEART CATH AND CORONARY ANGIOGRAPHY;  Surgeon: [name], MD;  Location: [facility];  Service: Cardiovascular;  Laterality: N/A;   VENTRICULOSTOMY  03/28/1997    Social History:   reports that she has never smoked. She has never used smokeless tobacco. She reports that she does not drink alcohol and does not use drugs.  Allergies  Allergen Reactions   Ketamine Other (See Comments)    In vegetative state for 15 minutes per pt   Clarithromycin Other (See Comments)    Stomach pain    Fentanyl Nausea And Vomiting   Maitake Mushroom [[place]] Itching    Itchy throat   Penicillin V Itching    Gi upset   Shellfish Allergy Swelling and Other (See Comments)    Throat swelling. Pt has never had shellfish but assumes it will cause swelling. Pt states contrast in CT is okay   Morphine Itching and Rash   Penicillins Rash    Has patient had a PCN reaction causing immediate rash, facial/tongue/throat swelling, SOB or lightheadedness with hypotension: Y Has  patient had a PCN reaction causing severe rash involving mucus membranes or skin necrosis: Y Has patient had a PCN reaction that required hospitalization: N Has patient had a PCN reaction occurring within the last 10 years: Y If all of the above answers are "NO", then may proceed with Cephalosporin use.    Prednisone Rash    Family History  Problem Relation Age of Onset   Diabetes Mother    Hypertension Mother     Hyperlipidemia Mother    Thyroid disease Mother    Hypertension Father    Diabetes Father    Pancreatic cancer Paternal Aunt    Pancreatic cancer Paternal Uncle    Colon cancer Neg Hx    Esophageal cancer Neg Hx    Stomach cancer Neg Hx      Prior to Admission medications   Medication Sig Start Date End Date Taking? Authorizing Provider  albuterol (VENTOLIN HFA) 108 (90 Base) MCG/ACT inhaler Inhale 2 puffs into the lungs every 6 (six) hours as needed for wheezing or shortness of breath. 12/31/18   [provider]  aspirin EC 81 MG EC tablet Take 1 tablet (81 mg total) by mouth daily. Swallow whole. 12/07/20   Ezekiel Slocumb, DO  carvedilol (COREG) 6.25 MG tablet Take 1 tablet (6.25 mg total) by mouth 2 (two) times daily. 03/11/21   Shelly Coss, MD  cetirizine (ZYRTEC) 10 MG tablet Take 10 mg by mouth daily.    [provider]  DULoxetine (CYMBALTA) 30 MG capsule Take 30 mg by mouth at bedtime. 01/28/19 12/16/21  [provider]  Evolocumab (REPATHA) 140 MG/ML SOSY Inject 140 mg into the skin See admin instructions. Inject 140 mg subcutaneously every other Sunday evening    [provider]  fenofibrate 160 MG tablet Take 1 tablet (160 mg total) by mouth daily. 12/07/20   Nicole Kindred A, DO  insulin regular human CONCENTRATED (HUMULIN R) 500 UNIT/ML injection Inject 260 Units into the skin See admin instructions. 260 units 3 times daily with each meal, 100 units at bedtime    [provider]  ivabradine (CORLANOR) 7.5 MG TABS tablet Take 1 tablet (7.5 mg total) by mouth 2 (two) times daily with a meal. 08/02/21   [name], Shaune Pascal, MD  JENCYCLA 0.35 MG tablet Take 1 tablet by mouth daily. 07/24/21   [provider]  levothyroxine (SYNTHROID) 125 MCG tablet Take 1 tablet (125 mcg total) by mouth daily at 6 (six) AM. 12/07/20   Ezekiel Slocumb, DO  lipase/protease/amylase (CREON) 36000 UNITS CPEP capsule Take 40,973-53,299 Units by mouth  See admin instructions. 72,000 units with each meal, 36,000 units with snacks    [provider]  Multiple Vitamin (MULTIVITAMIN WITH MINERALS) TABS tablet Take 1 tablet by mouth daily. 12/07/20   Ezekiel Slocumb, DO  nitroGLYCERIN (NITROSTAT) 0.4 MG SL tablet Place 1 tablet (0.4 mg total) under the tongue every 5 (five) minutes as needed for up to 25 days for chest pain. 06/16/21 08/03/22  [name], Shaune Pascal, MD  ofloxacin (OCUFLOX) 0.3 % ophthalmic solution Place 1 drop into both eyes in the morning, at noon, in the evening, and at bedtime. 09/02/20   [provider]  ondansetron (ZOFRAN) 4 MG tablet Take 4 mg by mouth every 8 (eight) hours as needed for nausea or vomiting. 12/19/18   [provider]  pantoprazole (PROTONIX) 40 MG tablet Take 40 mg by mouth daily. 12/24/19   [provider]  pregabalin (LYRICA) 100  MG capsule Take 1-2 capsules (100-200 mg total) by mouth See admin instructions. 200 mg in the morning, 100 mg in the afternnon 08/05/21   [name], Shaune Pascal, MD  pregabalin (LYRICA) 300 MG capsule Take 300 mg by mouth at bedtime.    [provider]  prochlorperazine (COMPAZINE) 10 MG tablet Take 1 tablet (10 mg total) by mouth every 6 (six) hours as needed for nausea or vomiting. 01/18/21   Molpus, John, MD  promethazine (PHENERGAN) 25 MG tablet Take 25 mg by mouth every 6 (six) hours as needed for vomiting or nausea. 03/26/21   [provider]  torsemide (DEMADEX) 20 MG tablet Take 2 tablets (40 mg total) by mouth 2 (two) times daily. 08/13/21   Milford, Maricela Bo, FNP  tretinoin (RETIN-A) 0.05 % cream Apply 1 application topically at bedtime. 01/17/21   [provider]    Physical Exam: Vitals:   08/14/21 0045 08/14/21 0130 08/14/21 0224 08/14/21 0324  BP: 125/78 124/76 110/66   Pulse: (!) 108 (!) 110 (!) 115   Resp: 18 20 18    Temp:   97.9 F (36.6 C)   TempSrc:   Oral   SpO2: 98% 95% 96%   Weight:    65 kg  Height:         Constitutional: NAD, calm  Eyes: PERTLA, lids and conjunctivae normal ENMT: Mucous membranes are moist. Posterior pharynx clear of any exudate or lesions.   Neck: supple, no masses  Respiratory: no wheezing, no crackles. No accessory muscle use.  Cardiovascular: S1 & S2 heard, regular rate and rhythm. No extremity edema.   Abdomen: no tenderness, soft. Bowel sounds active.  Musculoskeletal: no clubbing / cyanosis. No joint deformity upper and lower extremities.   Skin: no significant rashes, lesions, ulcers. Warm, dry, well-perfused. Neurologic: Left-sided weakness. Alert and oriented to person, place, and situation.  Psychiatric: Pleasant. Cooperative.    Labs and Imaging on Admission: I have personally reviewed following labs and imaging studies  CBC: Recent Labs  Lab 08/10/21 1445  WBC 6.2  HGB 12.1  HCT 36.3  MCV 89.6  PLT 267   Basic Metabolic Panel: Recent Labs  Lab 08/12/21 0919 08/13/21 1941 08/14/21 0111  NA 131* 131*  --   K 5.8* >7.5* 5.3*  CL 91* 98  --   CO2 WILL FOLLOW 17*  --   GLUCOSE 170* 387*  --   BUN 110* 130*  --   CREATININE 3.18* 3.59*  --   CALCIUM 10.6* 9.1  --    GFR: Estimated Creatinine Clearance: 17.8 mL/min (A) (by C-G formula based on SCr of 3.59 mg/dL (H)). Liver Function Tests: Recent Labs  Lab 08/13/21 1941  AST 78*  ALT 103*  ALKPHOS 72  BILITOT 0.6  PROT 8.3*  ALBUMIN 4.4   No results for input(s): LIPASE, AMYLASE in the last 168 hours. No results for input(s): AMMONIA in the last 168 hours. Coagulation Profile: No results for input(s): INR, PROTIME in the last 168 hours. Cardiac Enzymes: No results for input(s): CKTOTAL, CKMB, CKMBINDEX, TROPONINI in the last 168 hours. BNP (last 3 results) No results for input(s): PROBNP in the last 8760 hours. HbA1C: No results for input(s): HGBA1C in the last 72 hours. CBG: No results for input(s): GLUCAP in the last 168 hours. Lipid Profile: No results for input(s):  CHOL, HDL, LDLCALC, TRIG, CHOLHDL, LDLDIRECT in the last 72 hours. Thyroid Function Tests: No results for input(s): TSH, T4TOTAL, FREET4, T3FREE, THYROIDAB in the last  72 hours. Anemia Panel: No results for input(s): VITAMINB12, FOLATE, FERRITIN, TIBC, IRON, RETICCTPCT in the last 72 hours. Urine analysis:    Component Value Date/Time   COLORURINE YELLOW 06/06/2021 0451   APPEARANCEUR CLEAR 06/06/2021 0451   LABSPEC 1.014 06/06/2021 0451   PHURINE 6.0 06/06/2021 0451   GLUCOSEU >1,000 (A) 06/06/2021 0451   HGBUR NEGATIVE 06/06/2021 0451   BILIRUBINUR NEGATIVE 06/06/2021 0451   KETONESUR NEGATIVE 06/06/2021 0451   PROTEINUR TRACE (A) 06/06/2021 0451   NITRITE NEGATIVE 06/06/2021 0451   LEUKOCYTESUR NEGATIVE 06/06/2021 0451   Sepsis Labs: @LABRCNTIP (procalcitonin:4,lacticidven:4) )No results found for this or any previous visit (from the past 240 hour(s)).   Radiological Exams on Admission: DG Chest 2 View  Result Date: 08/13/2021 CLINICAL DATA:  Shortness of breath, CHF EXAM: CHEST - 2 VIEW COMPARISON:  05/13/2021 FINDINGS: Cardiac and mediastinal contours are within normal limits given AP technique. No focal pulmonary opacity. No pleural effusion or pneumothorax. No acute osseous abnormality. IMPRESSION: No acute cardiopulmonary process. Electronically Signed   By: Merilyn Baba M.D.   On: 08/13/2021 19:25    EKG: Independently reviewed. Sinus tachycardia, rate 101, peaked T-waves, QRS 106 ms.   Assessment/Plan   1. Hyperkalemia; acute renal failure superimposed on CKD IIIa  - Pt with CKD IIIa recently increased diuretics and potassium supplement and found to have acute renal failure with BUN 130, SCr 3.59, serum bicarb 17, and potassium >7.5 with peaked T-waves and QRS 106 ms  - She was given Lokelma, insulin, and IV calcium gluconate in ED with improvement in potassium to 5.3  - Continue cardiac monitoring, check UA and urine chemistries, start gentle infusion of isotonic  bicarbonate, follow serial chem panels    2. Chronic systolic CHF   - EF was 70% in December 2022  - She reports increased abdominal girth and 1-2 months of orthopnea but BNP and wt are down, CXR clear  - Cautiously starting gentle IVF hydration in setting of acute renal failure, follow weights and strict I/Os    3. Insulin-dependent DM  - A1c was 7.7% in Feb 2023  - Followed by [facility], had been taking 910 units of insulin per day  - Check CBGs, continue insulin with dose-reduction in light of acute renal failure    4. Elevated transaminases  - AST is 78 and ALT 103 on admission with normal alk phos and bili, benign abdominal exam  - She has hx of steatosis attributed to markedly elevated triglycerides  - Check viral hepatitis panel, trend     DVT prophylaxis: sq heparin  Code Status: Full  Level of Care: Level of care: Progressive Family Communication: husband at bedside  Disposition Plan:  Patient is from: home  Anticipated d/c is to: Home  Anticipated d/c date is: 08/18/21  Patient currently: pending improved renal function  Consults called: none  Admission status: Inpatient     [name], MD [facility]  08/14/2021, 3:29 AM

## 2021-08-14 NOTE — Progress Notes (Signed)
PROGRESS NOTE    Jennifer [name]  HFG:[id number] DOB: 1990-06-03 DOA: 08/13/2021 PCP: Sue Lush, PA-C   Brief Narrative:  HPI: Jennifer [name] is a pleasant 31 y.o. female with medical history significant for astrocytoma status postresection and chemotherapy, hypertriglyceridemia, chronic pancreatitis, thyroidism, CKD 3A, insulin-dependent diabetes mellitus, and nonischemic cardiomyopathy with EF 45%, now presenting to the emergency department with general weakness and fatigue.  Patient recently increased her torsemide, metolazone, and potassium supplement, had blood work concerning for acute renal failure and hyperkalemia, and began holding her diuretics and potassium yesterday.  She denies chest pain, cough, or shortness of breath at rest but has had some orthopnea for the past 1 to 2 months.  She denies any leg swelling, notes that her legs never seem to swell, but has noticed increased abdominal girth over the past few weeks.   ED Course: Upon arrival to the ED, patient is found to be afebrile and saturating well on room air with slightly elevated heart rate and stable blood pressure.  EKG features sinus tachycardia with rate under 1 and peaked T waves.  Chest x-ray negative for acute cardiopulmonary disease.  Chemistry panel notable for glucose 387, potassium >7.5, bicarbonate 17, BUN 130, creatinine 3.59, and mild elevation in transaminases.  BNP was 12.  Patient was treated with Lokelma, insulin, and IV calcium gluconate in the ED.    Assessment & Plan:   Principal Problem:   Hyperkalemia Active Problems:   Acute renal failure superimposed on stage 3a chronic kidney disease (HCC)   Nonischemic cardiomyopathy (HCC)   Elevated transaminase level   Hx of insulin dependent diabetes mellitus  Hyperkalemia: She was given Lokelma, insulin and IV calcium gluconate in the ED.  Present with potassium over 7.  Resolved.  AKI on CKD stage IIIa: Baseline creatinine as a few weeks ago  around 1.3-1.5.  Pt with CKD IIIa recently increased diuretics and potassium supplement and found to have acute renal failure with BUN 130, SCr 3.59, serum bicarb 17.  Slight improvement in creatinine.  We will transition from bicarb fluids to normal saline gently at 100 cc/h due to history of systolic congestive heart failure.  Hopefully renal function will improve tomorrow.  We will check renal ultrasound.  Chronic systolic CHF: Appears to be euvolemic.  Holding all diuretics. We will consult cardiology to help adjust medications.  Insulin-dependent type 2 diabetes mellitus: Followed by [facility] endocrinology.  Appears to be on very high dose of concentrated Humulin to 60 units 3 times daily with each meal and 100 units at bedtime.  Currently she is on Semglee 75 units, NovoLog 10 units 3 times daily Premeal and SSI, blood sugar is controlled.  Elevated transaminases  - AST is 78 and ALT 103 on admission with normal alk phos and bili, benign abdominal exam  - She has hx of steatosis attributed to markedly elevated triglycerides.  Viral hepatitis panel is negative.  History of peripheral neuropathy.  [name] requesting to resume her Lyrica.  GERD: Continue PPI.  DVT prophylaxis: heparin injection 5,000 Units Start: 08/14/21 0600   Code Status: Full Code  Family Communication:  None present at bedside.  Plan of care discussed with patient in length and he/she verbalized understanding and agreed with it.  Status is: Inpatient Remains inpatient appropriate because: Needs IV fluids.   Estimated body mass index is 31.01 kg/m as calculated from the following:   Height as of this encounter: 4' 9" (1.448 m).   Weight as of this  encounter: 65 kg.    Nutritional Assessment: Body mass index is 31.01 kg/m.Marland Kitchen Seen by dietician.  I agree with the assessment and plan as outlined below: Nutrition Status:        . Skin Assessment: I have examined the patient's skin and I agree with the wound  assessment as performed by the wound care RN as outlined below:    Consultants:  None  Procedures:  None  Antimicrobials:  Anti-infectives (From admission, onward)    None         Subjective: Patient seen and examined.  [name] feeling better.  No complaints.  [name] very happy to hear that her potassium is normal.  Objective: Vitals:   08/14/21 0334 08/14/21 0400 08/14/21 0500 08/14/21 0918  BP: 99/68 119/84 101/70 109/72  Pulse: (!) 114 (!) 112 (!) 112 (!) 122  Resp: _0 Temp: 97.6 F (36.4 C) 97.6 F (36.4 C) 98 F (36.7 C) 98 F (36.7 C)  TempSrc: Oral Oral Oral Oral  SpO2: 98% 96% 96% 98%  Weight:      Height:        Intake/Output Summary (Last 24 hours) at 08/14/2021 1128 Last data filed at 08/14/2021 0011 Gross per 24 hour  Intake 50 ml  Output --  Net 50 ml   Filed Weights   08/13/21 1757 08/14/21 0324  Weight: 64 kg 65 kg    Examination:  General exam: Appears calm and comfortable  Respiratory system: Clear to auscultation. Respiratory effort normal. Cardiovascular system: S1 & S2 heard, RRR. No JVD, murmurs, rubs, gallops or clicks. No pedal edema. Gastrointestinal system: Abdomen is nondistended, soft and nontender. No organomegaly or masses felt. Normal bowel sounds heard. Central nervous system: Alert and oriented. No focal neurological deficits. Extremities: Symmetric 5 x 5 power. Skin: No rashes, lesions or ulcers Psychiatry: Judgement and insight appear normal. Mood & affect appropriate.    Data Reviewed: I have personally reviewed following labs and imaging studies  CBC: Recent Labs  Lab 08/10/21 1445 08/14/21 0417  WBC 6.2 6.0  HGB 12.1 12.4  HCT 36.3 36.8  MCV 89.6 88.2  PLT 197 939   Basic Metabolic Panel: Recent Labs  Lab 08/12/21 0919 08/13/21 1941 08/14/21 0111 08/14/21 0417  NA 131* 131*  --  133*  K 5.8* >7.5* 5.3* 4.9  CL 91* 98  --  97*  CO2 WILL FOLLOW 17*  --  18*  GLUCOSE 170* 387*  --  261*   BUN 110* 130*  --  130*  CREATININE 3.18* 3.59*  --  3.42*  CALCIUM 10.6* 9.1  --  9.8   GFR: Estimated Creatinine Clearance: 18.7 mL/min (A) (by C-G formula based on SCr of 3.42 mg/dL (H)). Liver Function Tests: Recent Labs  Lab 08/13/21 1941 08/14/21 0417  AST 78* 59*  ALT 103* 87*  ALKPHOS 72 79  BILITOT 0.6 0.6  PROT 8.3* 7.9  ALBUMIN 4.4 4.2   No results for input(s): LIPASE, AMYLASE in the last 168 hours. No results for input(s): AMMONIA in the last 168 hours. Coagulation Profile: No results for input(s): INR, PROTIME in the last 168 hours. Cardiac Enzymes: No results for input(s): CKTOTAL, CKMB, CKMBINDEX, TROPONINI in the last 168 hours. BNP (last 3 results) No results for input(s): PROBNP in the last 8760 hours. HbA1C: No results for input(s): HGBA1C in the last 72 hours. CBG: Recent Labs  Lab 08/14/21 0550  GLUCAP 289*   Lipid Profile: No results  for input(s): CHOL, HDL, LDLCALC, TRIG, CHOLHDL, LDLDIRECT in the last 72 hours. Thyroid Function Tests: No results for input(s): TSH, T4TOTAL, FREET4, T3FREE, THYROIDAB in the last 72 hours. Anemia Panel: No results for input(s): VITAMINB12, FOLATE, FERRITIN, TIBC, IRON, RETICCTPCT in the last 72 hours. Sepsis Labs: No results for input(s): PROCALCITON, LATICACIDVEN in the last 168 hours.  No results found for this or any previous visit (from the past 240 hour(s)).   Radiology Studies: DG Chest 2 View  Result Date: 08/13/2021 CLINICAL DATA:  Shortness of breath, CHF EXAM: CHEST - 2 VIEW COMPARISON:  05/13/2021 FINDINGS: Cardiac and mediastinal contours are within normal limits given AP technique. No focal pulmonary opacity. No pleural effusion or pneumothorax. No acute osseous abnormality. IMPRESSION: No acute cardiopulmonary process. Electronically Signed   By: Merilyn Baba M.D.   On: 08/13/2021 19:25    Scheduled Meds:  aspirin EC  81 mg Oral Daily   carvedilol  6.25 mg Oral BID   DULoxetine  30 mg Oral  QHS   fenofibrate  160 mg Oral Daily   heparin  5,000 Units Subcutaneous Q8H   insulin aspart  0-5 Units Subcutaneous QHS   insulin aspart  0-6 Units Subcutaneous TID WC   insulin aspart  10 Units Subcutaneous TID WC   insulin glargine-yfgn  75 Units Subcutaneous BID   [START ON 08/15/2021] levothyroxine  125 mcg Oral Q0600   pantoprazole  40 mg Oral Daily   pregabalin  150 mg Oral QHS   sodium chloride flush  3 mL Intravenous Q12H   Continuous Infusions:  sodium chloride 100 mL/hr at 08/14/21 0854     LOS: 0 days   Darliss Cheney, MD [facility]  08/14/2021, 11:28 AM   *Please note that this is a verbal dictation therefore any spelling or grammatical errors are due to the "[place] [employer]" system interpretation.  Please page via [place] and do not message via secure chat for urgent patient care matters. Secure chat can be used for non urgent patient care matters.  How to contact the [facility] Attending or Consulting provider [place] or covering provider during after hours [place], for this patient?  Check the care team in [facility] and look for a) attending/consulting [facility] provider listed and b) the [facility] team listed. Page or secure chat [phone]. Log into [email or web address] and use [place]'s universal password to access. If you do not have the password, please contact the hospital operator. Locate the [facility] provider you are looking for under [facility] and page to a number that you can be directly reached. If you still have difficulty reaching the provider, please page the [facility] (Director on Call) for the Hospitalists listed on amion for assistance.

## 2021-08-14 NOTE — Progress Notes (Signed)
MD notified of critical potassium 6.6.  orders received for STAT recheck.  Will cont plan of care.

## 2021-08-14 NOTE — ED Notes (Addendum)
ED TO INPATIENT HANDOFF REPORT  ED Nurse Name and Phone #:  [name] RN [phone] [name] EMT-P [phone]   S Name/Age/Gender Jennifer [name] 31 y.o. female Room/Bed: WA04/WA04  Code Status   Code Status: Prior  Home/SNF/Other Home Patient oriented to: self, place, time, and situation Is this baseline? Yes   Triage Complete: Triage complete  Chief Complaint Hyperkalemia [E87.5]  Triage Note Patient c/o muscle weakness, tingling, SOB. Patient reports a history of CHF. Patient states she was told to stop her kidney meds today due to elevated BUN and creatinine. BUN-110 and creat-3.18, K-5.8   Allergies Allergies  Allergen Reactions   Ketamine Other (See Comments)    In vegetative state for 15 minutes per pt   Clarithromycin Other (See Comments)    Stomach pain    Fentanyl Nausea And Vomiting   Maitake Mushroom [[place]] Itching    Itchy throat   Penicillin V Itching    Gi upset   Shellfish Allergy Swelling and Other (See Comments)    Throat swelling. Pt has never had shellfish but assumes it will cause swelling. Pt states contrast in CT is okay   Morphine Itching and Rash   Penicillins Rash    Has patient had a PCN reaction causing immediate rash, facial/tongue/throat swelling, SOB or lightheadedness with hypotension: Y Has patient had a PCN reaction causing severe rash involving mucus membranes or skin necrosis: Y Has patient had a PCN reaction that required hospitalization: N Has patient had a PCN reaction occurring within the last 10 years: Y If all of the above answers are "NO", then may proceed with Cephalosporin use.    Prednisone Rash    Level of Care/Admitting Diagnosis ED Disposition     ED Disposition  Admit   Condition  --   [place]: [place] [[id number]]  Level of Care: Progressive [102]  Admit to Progressive based on following criteria: NEPHROLOGY stable condition requiring close monitoring for AKI,  requiring Hemodialysis or Peritoneal Dialysis either from expected electrolyte imbalance, acidosis, or fluid overload that can be managed by NIPPV or high flow oxygen.  May admit patient to [facility] or [facility] if equivalent level of care is available:: No  Covid Evaluation: Asymptomatic - no recent exposure (last 10 days) testing not required  Diagnosis: Hyperkalemia [846962]  Admitting Physician: [name] [[id number]]  Attending Physician: [name] [[id number]]  Estimated length of stay: past midnight tomorrow  Certification:: I certify this patient will need inpatient services for at least 2 midnights          B Medical/Surgery History Past Medical History:  Diagnosis Date   Brain tumor ([place]) 03/29/1995   astrocytoma   CHF (congestive heart failure) ([place])    Cholesterosis    DM (diabetes mellitus) ([place]) 10/10/2018   Fatty liver    HTN (hypertension) 10/10/2018   Hypothyroidism 10/10/2018   Lipoprotein deficiency    Lung disease    longevity long term   Pancreatitis    Polycystic ovary syndrome    Past Surgical History:  Procedure Laterality Date   ABDOMINAL SURGERY     pt states during miscarriage got her intestine   BRAIN SURGERY     EYE MUSCLE SURGERY Right 03/28/2014   RIGHT HEART CATH N/A 08/05/2021   Procedure: RIGHT HEART CATH;  Surgeon: [name], MD;  Location: [facility];  Service: Cardiovascular;  Laterality: N/A;   RIGHT/LEFT HEART CATH AND CORONARY ANGIOGRAPHY N/A  12/03/2020   Procedure: RIGHT/LEFT HEART CATH AND CORONARY ANGIOGRAPHY;  Surgeon: [name], MD;  Location: [facility];  Service: Cardiovascular;  Laterality: N/A;   VENTRICULOSTOMY  03/28/1997     A IV Location/Drains/Wounds Patient Lines/Drains/Airways Status     Active Line/Drains/Airways     Name Placement date Placement time Site Days   Peripheral IV 08/13/21 20 G Posterior;Right Forearm 08/13/21  2005  Forearm  1            Intake/Output  Last 24 hours  Intake/Output Summary (Last 24 hours) at 08/14/2021 0219 Last data filed at 08/14/2021 0011 Gross per 24 hour  Intake 50 ml  Output --  Net 50 ml    Labs/Imaging Results for orders placed or performed during the hospital encounter of 08/13/21 (from the past 48 hour(s))  Brain natriuretic peptide     Status: None   Collection Time: 08/13/21  6:37 PM  Result Value Ref Range   B Natriuretic Peptide 11.6 0.0 - 100.0 pg/mL    Comment: Performed at [facility], [place] [address], [place], [place] [address]  Comprehensive metabolic panel     Status: Abnormal   Collection Time: 08/13/21  7:41 PM  Result Value Ref Range   Sodium 131 (L) 135 - 145 mmol/L    Comment: POST-ULTRACENTRIFUGATION   Potassium >7.5 (HH) 3.5 - 5.1 mmol/L    Comment: SLIGHT HEMOLYSIS CRITICAL RESULT CALLED TO, READ BACK BY AND VERIFIED WITH: [name],EMT 08/13/21 2321 WAYK    Chloride 98 98 - 111 mmol/L   CO2 17 (L) 22 - 32 mmol/L   Glucose, Bld 387 (H) 70 - 99 mg/dL    Comment: Glucose reference range applies only to samples taken after fasting for at least 8 hours.   BUN 130 (H) 6 - 20 mg/dL   Creatinine, Ser 3.59 (H) 0.44 - 1.00 mg/dL   Calcium 9.1 8.9 - 10.3 mg/dL   Total Protein 8.3 (H) 6.5 - 8.1 g/dL   Albumin 4.4 3.5 - 5.0 g/dL   AST 78 (H) 15 - 41 U/L   ALT 103 (H) 0 - 44 U/L   Alkaline Phosphatase 72 38 - 126 U/L   Total Bilirubin 0.6 0.3 - 1.2 mg/dL   GFR, Estimated 17 (L) >60 mL/min    Comment: (NOTE) Calculated using the CKD-EPI Creatinine Equation (2021)    Anion gap 16 (H) 5 - 15    Comment: Performed at [facility], [place] [address], [place], [place] [address]  Potassium     Status: Abnormal   Collection Time: 08/14/21  1:11 AM  Result Value Ref Range   Potassium 5.3 (H) 3.5 - 5.1 mmol/L    Comment: DELTA CHECK NOTED Performed at [facility], [place] [address] [place], [place] [address]    DG Chest 2 View  Result Date:  08/13/2021 CLINICAL DATA:  Shortness of breath, CHF EXAM: CHEST - 2 VIEW COMPARISON:  05/13/2021 FINDINGS: Cardiac and mediastinal contours are within normal limits given AP technique. No focal pulmonary opacity. No pleural effusion or pneumothorax. No acute osseous abnormality. IMPRESSION: No acute cardiopulmonary process. Electronically Signed   By: Merilyn Baba M.D.   On: 08/13/2021 19:25    Pending Labs Unresulted Labs (From admission, onward)    None       Vitals/Pain Today's Vitals   08/13/21 2215 08/13/21 2330 08/14/21 0045 08/14/21 0130  BP: 111/80 115/80 125/78 124/76  Pulse: (!) 105 (!) 111 Marland Kitchen)  108 (!) 110  Resp: (!) 23 19 18 20   Temp:      TempSrc:      SpO2: 99% 100% 98% 95%  Weight:      Height:      PainSc:        Isolation Precautions No active isolations  Medications Medications  sodium zirconium cyclosilicate (LOKELMA) packet 10 g (10 g Oral Given 08/13/21 2349)  calcium gluconate 1 g/ 50 mL sodium chloride IVPB (0 mg Intravenous Stopped 08/14/21 0011)  insulin aspart (novoLOG) injection 5 Units (5 Units Intravenous Given 08/13/21 2350)    Mobility walks Low fall risk   Focused Assessments A&Ox4, Ambulatory with minor assistance Respirations equal, clear, unlabored. Currently denies any complaints at this time.   R Recommendations: See Admitting Provider Note  Report given to: Baldo Ash RN  Additional Notes:  20g IV Posterior R Forearm

## 2021-08-14 NOTE — ED Notes (Signed)
Provided report to [facility].

## 2021-08-14 NOTE — ED Notes (Addendum)
[facility] called for transport. They are on the way.

## 2021-08-14 NOTE — ED Notes (Signed)
Patient denies pain and is resting comfortably.  

## 2021-08-15 ENCOUNTER — Inpatient Hospital Stay (HOSPITAL_COMMUNITY): Payer: BC Managed Care – PPO

## 2021-08-15 ENCOUNTER — Other Ambulatory Visit (HOSPITAL_COMMUNITY): Payer: BC Managed Care – PPO

## 2021-08-15 DIAGNOSIS — I5021 Acute systolic (congestive) heart failure: Secondary | ICD-10-CM

## 2021-08-15 DIAGNOSIS — I5043 Acute on chronic combined systolic (congestive) and diastolic (congestive) heart failure: Secondary | ICD-10-CM

## 2021-08-15 DIAGNOSIS — N179 Acute kidney failure, unspecified: Secondary | ICD-10-CM | POA: Diagnosis not present

## 2021-08-15 DIAGNOSIS — E875 Hyperkalemia: Secondary | ICD-10-CM | POA: Diagnosis not present

## 2021-08-15 DIAGNOSIS — N1831 Chronic kidney disease, stage 3a: Secondary | ICD-10-CM | POA: Diagnosis not present

## 2021-08-15 LAB — POTASSIUM
Potassium: 4.6 mmol/L (ref 3.5–5.1)
Potassium: 4.9 mmol/L (ref 3.5–5.1)
Potassium: 5 mmol/L (ref 3.5–5.1)
Potassium: 5 mmol/L (ref 3.5–5.1)
Potassium: 5.5 mmol/L — ABNORMAL HIGH (ref 3.5–5.1)

## 2021-08-15 LAB — CBC
HCT: 33.4 % — ABNORMAL LOW (ref 36.0–46.0)
Hemoglobin: 11.5 g/dL — ABNORMAL LOW (ref 12.0–15.0)
MCH: 29.9 pg (ref 26.0–34.0)
MCHC: 34.4 g/dL (ref 30.0–36.0)
MCV: 87 fL (ref 80.0–100.0)
Platelets: 142 10*3/uL — ABNORMAL LOW (ref 150–400)
RBC: 3.84 MIL/uL — ABNORMAL LOW (ref 3.87–5.11)
RDW: 15.5 % (ref 11.5–15.5)
WBC: 6.2 10*3/uL (ref 4.0–10.5)
nRBC: 0 % (ref 0.0–0.2)

## 2021-08-15 LAB — BASIC METABOLIC PANEL
Anion gap: 15 (ref 5–15)
BUN: 117 mg/dL — ABNORMAL HIGH (ref 6–20)
CO2: 19 mmol/L — ABNORMAL LOW (ref 22–32)
Calcium: 9.1 mg/dL (ref 8.9–10.3)
Chloride: 98 mmol/L (ref 98–111)
Creatinine, Ser: 2.43 mg/dL — ABNORMAL HIGH (ref 0.44–1.00)
GFR, Estimated: 27 mL/min — ABNORMAL LOW (ref >60)
Glucose, Bld: 292 mg/dL — ABNORMAL HIGH (ref 70–99)
Potassium: 4.9 mmol/L (ref 3.5–5.1)
Sodium: 132 mmol/L — ABNORMAL LOW (ref 135–145)

## 2021-08-15 LAB — GLUCOSE, CAPILLARY
Glucose-Capillary: 325 mg/dL — ABNORMAL HIGH (ref 70–99)
Glucose-Capillary: 292 mg/dL — ABNORMAL HIGH (ref 70–99)
Glucose-Capillary: 288 mg/dL — ABNORMAL HIGH (ref 70–99)
Glucose-Capillary: 333 mg/dL — ABNORMAL HIGH (ref 70–99)

## 2021-08-15 LAB — ECHOCARDIOGRAM COMPLETE
AR max vel: 2.5 cm2
AV Peak grad: 7.4 mmHg
Ao pk vel: 1.36 m/s
Area-P 1/2: 6.54 cm2
Height: 57 in
P 1/2 time: 530 msec
S' Lateral: 3.4 cm
Single Plane A4C EF: 39.3 %
Weight: 2292.8 oz

## 2021-08-15 LAB — HEMOGLOBIN A1C
Hgb A1c MFr Bld: 7.9 % — ABNORMAL HIGH (ref 4.8–5.6)
Mean Plasma Glucose: 180.03 mg/dL

## 2021-08-15 LAB — UREA NITROGEN, URINE: Urea Nitrogen, Ur: 299 mg/dL

## 2021-08-15 MED ORDER — INSULIN ASPART 100 UNIT/ML IJ SOLN
0.0000 [IU] | Freq: Three times a day (TID) | INTRAMUSCULAR | Status: DC
  Administered 2021-08-15 (×2): 8 [IU] via SUBCUTANEOUS
  Administered 2021-08-16: 5 [IU] via SUBCUTANEOUS
  Administered 2021-08-16: 3 [IU] via SUBCUTANEOUS
  Administered 2021-08-16: 15 [IU] via SUBCUTANEOUS

## 2021-08-15 MED ORDER — INSULIN GLARGINE-YFGN 100 UNIT/ML ~~LOC~~ SOLN
95.0000 [IU] | Freq: Two times a day (BID) | SUBCUTANEOUS | Status: DC
  Administered 2021-08-15 – 2021-08-16 (×2): 95 [IU] via SUBCUTANEOUS
  Filled 2021-08-15 (×3): qty 0.95

## 2021-08-15 MED ORDER — IVABRADINE HCL 5 MG PO TABS
5.0000 mg | ORAL_TABLET | Freq: Two times a day (BID) | ORAL | Status: DC
Start: 2021-08-15 — End: 2021-08-17
  Administered 2021-08-15 – 2021-08-17 (×4): 5 mg via ORAL
  Filled 2021-08-15 (×5): qty 1

## 2021-08-15 MED ORDER — INSULIN ASPART 100 UNIT/ML IJ SOLN
12.0000 [IU] | Freq: Three times a day (TID) | INTRAMUSCULAR | Status: DC
  Administered 2021-08-15 – 2021-08-16 (×3): 12 [IU] via SUBCUTANEOUS

## 2021-08-15 MED ORDER — INSULIN ASPART 100 UNIT/ML IJ SOLN
0.0000 [IU] | Freq: Every day | INTRAMUSCULAR | Status: DC
  Administered 2021-08-15: 4 [IU] via SUBCUTANEOUS
  Administered 2021-08-16: 2 [IU] via SUBCUTANEOUS

## 2021-08-15 MED ORDER — INSULIN ASPART 100 UNIT/ML IJ SOLN: 12.0000 [IU] | Freq: Three times a day (TID) | INTRAMUSCULAR | Status: DC

## 2021-08-15 MED ORDER — INSULIN GLARGINE-YFGN 100 UNIT/ML ~~LOC~~ SOLN
85.0000 [IU] | Freq: Two times a day (BID) | SUBCUTANEOUS | Status: DC
  Administered 2021-08-15: 85 [IU] via SUBCUTANEOUS
  Filled 2021-08-15 (×2): qty 0.85

## 2021-08-15 MED ORDER — IVABRADINE HCL 7.5 MG PO TABS
7.5000 mg | ORAL_TABLET | Freq: Two times a day (BID) | ORAL | Status: DC
  Filled 2021-08-15: qty 1

## 2021-08-15 NOTE — Consult Note (Addendum)
Cardiology Consultation:   Patient ID: Abisola Carrero MRN: [id number]; DOB: 01-14-1991  Admit date: 08/13/2021 Date of Consult: 08/15/2021  PCP:  Sue Lush, PA-C   [facility] Providers Cardiologist: Dr Nadyne Coombes and Dr Haroldine Laws    Patient Profile:   Allison Deshotels is a 31 y.o. 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  History of Present Illness:   Ms. Lai with above complex past medical history presented to the ER 08/14/2021 with fatigue and generalized weakness.  She follows Dr. Einar Gip and was referred to Dr. Haroldine Laws for nonischemic cardiomyopathy dating back to 2019.  Cardiac MRI from 08/04/2017 at [facility] revealed LVEF 42%, mid to basal inferoseptal hypokinesis, normal RV size with mildly reduced function, subepicardial enhancement of mid inferoseptal wall segment compatible with myocarditis.  Small focal subendocardial enhancement in the mid inferoseptum  which is compatible with infarction, myocardial edema of the mid epicardial inferoseptum which is compatible with acute myocarditis.Small circumferential pericardial effusion.  Echo 10/03/2018 LVEF 40 to 45%, mild global hypokinesis, grade 1 DD, moderate aortic regurgitation, mild to moderate MR.  Mild TR.  PASP 31 mmHg.   Echo 04/15/2019 with LVEF 40%, grade 1 DD, mild to moderate AR, moderate MR, moderate TR, PASP 30 mmHg.   Lexiscan 05/09/2019 was normal myocardial perfusion, LVEF 38%.  Hospitalized 11/22/2020 with abdominal  pain and diarrhea, triglyceride 4030, treated with insulin/dextros gtt, seen by vascular surgery, was concerned of vasculitis based on CT scan.  Hospital course complicated by AKI and acute systolic heart failure leading to cardiogenic shock.  Echocardiogram from 11/28/2020 showed worsening LVEF 25 to 30%, global hypokinesis, grade 2 DD, elevated LA pressure, RV volume overload, mildly reduced RV systolic function, mildly elevated PASP with RVSP 41mHg, mild to moderate MR, moderate to severe TR. Advanced heart failure was consulted.   Cardiac MRI completed 12/01/2020 which revealed midwall late gadolinium enhancement in basal to mid anterior/anterolateral walls, concerning for myocarditis versus sarcoidosis.  LVEF 29%.  Normal RV size and systolic function with EF 55%.  Moderate pericardial effusion.  High sensitive troponin was 29 which is not consistent with acute myocarditis.  Right and left heart catheterization 12/03/2020 revealed nonischemic cardiomyopathy, normal right heart cath pressures with RA 4/1, mean 1; RV 23/4, EDP 6; PA 22/8, mean 11 and PA saturation 76%.  PW 9/7, mean 7 mmHg. CO 5.6, CI 3.88.  QP/QS 1.00. LV: 136/9, EDP 17 mmHg.  Ao 131/86, mean 105 mmHg.  No pressure gradient across the aortic valve. A very small but dominant RCA which is diffusely diseased around 80%.  Mid segment 99% occlusion.  Conus branch collateralizes distal RCA.  Minimal diffuse disease noted of LAD and Cx.  It was felt etiology of NICM may be due to delayed chemo related cardiotoxicity.  She required milrinone support and diuresis, was treated with GDMT including Entresto, carvedilol, Corlanor, spironolactone, digoxin; SGLT2i was not used due to type 1 diabetes.  [facility].  Post hospitalization, she was not compliant with fluid restriction and low-sodium diet, causing volume overload.  [facility] for a 2nd  opinion. He switched beta blocker to Toprol XL and recommended Lifevest.   Follow-up with AHF clinic on 01/06/2021 noted with fluid overload, Lasix was switched to torsemide.  She had a LifeVest placed per cardiology at [facility].  Hospitalized 01/24/2021 for AKI, abdominal pain, hyperglycemia (not DKA), acute on chronic hyponatremia 118.  GDMT was held.  Nephrology recommended to Tolvaptan but patient declined.  After that, she had low ceruloplasmin level and eventually negative work-up for Wilson's disease at [facility].  Echocardiogram repeated on 01/13/21 with LVEF recovered to 45 to 50%, grade 2 DD, mild hypokinesis of LV, basal anteroseptal wall and inferoseptal wall, normal RV and PASP, mild MR, mild AR.  She was told okay to stop wearing LifeVest.  Hospitalized 03/09/2021 with chest pain cardiogram 03/10/2021 showed LVEF 45%, basal inferior wall segment and basal inferior septal segment hypokinesis, normal RV, no MR, trivial AR.  She was seen by Dr. Einar Gip in the hospital, abnormal wall motion on echo was felt due to known occluded RCA, no further ischemic work-up was recommended.  She had worsening weight gain with abdominal swelling and bloating AHF clinic follow-up on 08/02/2021, arrange for right heart catheterization on 08/05/2021, which revealed elevated filling pressures with normal cardiac output, mild to moderate mixed pulmonary hypertension with prominent V waves and RA suggesting significant TR.  RA 12 prominent V waves, RV 47/14, PA 48/25 (37), PCW 25, Fick cardiac output/index 4.4/2.8, PVR 3.7 WU,  Ao sat = 99%, PA sat = 62%, 62%.  She was given IV Lasix before discharge and recommended adding metolazone 2.5 weekly.    She was last seen in AHF clinic on 08/10/2021, ReDS 43%, EKG with sinus tachycardia, due to volume overload, she was recommended to increase torsemide to 60 mg twice daily, extra dose metolazone that day, and continue GDMT with Corlanor, carvedilol, spironolactone, Entresto; no  longer on digoxin given improved EF; off Jardiance due to adverse effects per endocrinology; with future plan considering Furoscix.  [name] continued on aspirin and statin for single-vessel RCA occlusion CAD.  She had reported hematochezia where CBC was obtained and showed baseline hemoglobin 12.1.     Patient presented to the ER on 08/13/2021 multiple complaints being nausea, tingling, fatigue, generalized numbness and shortness of breath.  She had lot of muscle cramps of her legs and jaws. She states her abdominal swelling is better now. No more muscle cramps. She denied any chest pain or SOB, is urinating well. She endorse chronic abdominal pain unchanged. She states her diabetes was diagnosed at age of 89 and had her first pancreatitis at age of 67, reports she was told she has type 1.5 DM.  Lab with hyperkalemia 7.5 and a serum creatinine 3.59 at admission.  She was admitted to hospital medicine service for AKI on CKD stage III, given Lokelma/insulin/calcium for severe hypokalemia, given gentle IV fluids for AKI due to overdiuresis, diuretics and Entresto/corlanor had been held, cardiology is consulted today to further assist medication adjustment.    Past Medical History:  Diagnosis Date   Brain tumor [facility]) 03/29/1995   astrocytoma   CHF (congestive heart failure) ([place])    Cholesterosis    DM (diabetes mellitus) ([place]) 10/10/2018   Fatty liver    HTN (hypertension) 10/10/2018   Hypothyroidism 10/10/2018   Lipoprotein deficiency    Lung disease    longevity long term   Pancreatitis    Polycystic ovary syndrome     Past Surgical History:  Procedure Laterality Date   ABDOMINAL SURGERY     pt states during miscarriage got her intestine   BRAIN SURGERY     EYE MUSCLE SURGERY Right 03/28/2014   RIGHT HEART CATH N/A 08/05/2021   Procedure: RIGHT HEART CATH;  Surgeon: [name], MD;  Location: Uncertain CV LAB;  Service: Cardiovascular;  Laterality: N/A;   RIGHT/LEFT HEART  CATH AND CORONARY ANGIOGRAPHY N/A 12/03/2020   Procedure: RIGHT/LEFT HEART CATH AND CORONARY ANGIOGRAPHY;  Surgeon: [name], MD;  Location: [facility];  Service: Cardiovascular;  Laterality: N/A;   VENTRICULOSTOMY  03/28/1997     Home Medications:  Prior to Admission medications   Medication Sig Start Date End Date Taking? Authorizing Provider  albuterol (VENTOLIN HFA) 108 (90 Base) MCG/ACT inhaler Inhale 1 puff into the lungs every 6 (six) hours as needed for wheezing or shortness of breath. 12/31/18  Yes [provider]  aspirin EC 81 MG EC tablet Take 1 tablet (81 mg total) by mouth daily. Swallow whole. 12/07/20  Yes Nicole Kindred A, DO  carvedilol (COREG) 6.25 MG tablet Take 1 tablet (6.25 mg total) by mouth 2 (two) times daily. 03/11/21  Yes Shelly Coss, MD  cetirizine (ZYRTEC) 10 MG tablet Take 10 mg by mouth daily.   Yes [provider]  doxycycline (VIBRA-TABS) 100 MG tablet Take 100 mg by mouth 2 (two) times daily. 08/10/21  Yes [provider]  DULoxetine (CYMBALTA) 30 MG capsule Take 30 mg by mouth at bedtime. 01/28/19 12/16/21 Yes [provider]  erythromycin ophthalmic ointment Place one application. into the right eye at bedtime. 03/25/20  Yes [provider]  Evolocumab (REPATHA) 140 MG/ML SOSY Inject 140 mg into the skin See admin instructions. Inject 140 mg subcutaneously every other Sunday evening   Yes [provider]  fenofibrate 160 MG tablet Take 1 tablet (160 mg total) by mouth daily. 12/07/20  Yes Nicole Kindred A, DO  fluticasone (FLONASE) 50 MCG/ACT nasal spray Place 1 spray into both nostrils daily.   Yes [provider]  insulin regular human CONCENTRATED (HUMULIN R) 500 UNIT/ML injection Inject 260 Units into the skin See admin instructions. 260 units 3 times daily with each meal, 100 units at bedtime   Yes [provider]  ivabradine (CORLANOR) 7.5 MG TABS tablet Take 1 tablet (7.5 mg  total) by mouth 2 (two) times daily with a meal. 08/02/21  Yes [name], Shaune Pascal, MD  JENCYCLA 0.35 MG tablet Take 1 tablet by mouth daily. 07/24/21  Yes [provider]  levothyroxine (SYNTHROID) 125 MCG tablet Take 1 tablet (125 mcg total) by mouth daily at 6 (six) AM. 12/07/20  Yes Nicole Kindred A, DO  lipase/protease/amylase (CREON) 36000 UNITS CPEP capsule Take 16,109-60,454 Units by mouth See admin instructions. 72,000 units with each meal, 36,000 units with snacks   Yes [provider]  metolazone (ZAROXOLYN) 2.5 MG tablet Take by mouth. 08/05/21  Yes [provider]  metroNIDAZOLE (FLAGYL) 250 MG tablet Take 250 mg by mouth 3 (three) times daily. 08/10/21  Yes [provider]  Multiple Vitamin (MULTIVITAMIN WITH MINERALS) TABS tablet Take 1 tablet by mouth daily. 12/07/20  Yes Nicole Kindred A, DO  nitroGLYCERIN (NITROSTAT) 0.4 MG SL tablet Place 1 tablet (0.4 mg total) under the tongue every 5 (five) minutes as needed for up to 25 days for chest pain. 06/16/21 08/03/22 Yes [name], Shaune Pascal, MD  ofloxacin (OCUFLOX) 0.3 % ophthalmic solution Place 1 drop into  both eyes in the morning, at noon, in the evening, and at bedtime. 09/02/20  Yes [provider]  ondansetron (ZOFRAN) 4 MG tablet Take 4 mg by mouth every 8 (eight) hours as needed for nausea or vomiting. 12/19/18  Yes [provider]  pantoprazole (PROTONIX) 40 MG tablet Take 40 mg by mouth daily. 12/24/19  Yes [provider]  pregabalin (LYRICA) 100 MG capsule Take 1-2 capsules (100-200 mg total) by mouth See admin instructions. 200 mg in the morning, 100 mg in the afternnon 08/05/21  Yes [name], Shaune Pascal, MD  pregabalin (LYRICA) 300 MG capsule Take 300 mg by mouth at bedtime.   Yes [provider]  prochlorperazine (COMPAZINE) 10 MG tablet Take 1 tablet (10 mg total) by mouth every 6 (six) hours as needed for nausea or vomiting. 01/18/21  Yes Molpus, John, MD   promethazine (PHENERGAN) 25 MG tablet Take 25 mg by mouth every 6 (six) hours as needed for vomiting or nausea. 03/26/21  Yes [provider]  rosuvastatin (CRESTOR) 40 MG tablet Take 40 mg by mouth daily. 08/09/21  Yes [provider]  sacubitril-valsartan (ENTRESTO) 49-51 MG Take 1 tablet by mouth every 12 (twelve) hours. 01/04/21  Yes [provider]  spironolactone (ALDACTONE) 25 MG tablet Take 12.5 mg by mouth daily. Pt takes half tab daily   Yes [provider]  torsemide (DEMADEX) 20 MG tablet Take 2 tablets (40 mg total) by mouth 2 (two) times daily. 08/13/21  Yes Milford, Maricela Bo, FNP  tretinoin (RETIN-A) 0.05 % cream Apply 1 application topically at bedtime. 01/17/21  Yes [provider]  JARDIANCE 10 MG TABS tablet Take 10 mg by mouth daily. Patient not taking: Reported on 08/14/2021 08/09/21   [provider]    Inpatient Medications: Scheduled Meds:  aspirin EC  81 mg Oral Daily   carvedilol  6.25 mg Oral BID   DULoxetine  30 mg Oral QHS   fenofibrate  160 mg Oral Daily   heparin  5,000 Units Subcutaneous Q8H   insulin aspart  0-15 Units Subcutaneous TID WC   insulin aspart  0-5 Units Subcutaneous QHS   insulin aspart  12 Units Subcutaneous TID WC   insulin glargine-yfgn  85 Units Subcutaneous BID   levothyroxine  125 mcg Oral Q0600   pantoprazole  40 mg Oral Daily   pregabalin  150 mg Oral QHS   sodium chloride flush  3 mL Intravenous Q12H   sodium zirconium cyclosilicate  10 g Oral TID   Continuous Infusions:  sodium chloride 100 mL/hr at 08/15/21 0532   PRN Meds: acetaminophen **OR** acetaminophen, albuterol, nitroGLYCERIN, ondansetron **OR** ondansetron (ZOFRAN) IV  Allergies:    Allergies  Allergen Reactions   Ketamine Other (See Comments)    In vegetative state for 15 minutes per pt   Clarithromycin Other (See Comments)    Stomach pain    Fentanyl Nausea And Vomiting   Maitake Mushroom [M[place]] Itching     Itchy throat   Penicillin V Itching    Gi upset   Shellfish Allergy Swelling and Other (See Comments)    Throat swelling. Pt has never had shellfish but assumes it will cause swelling. Pt states contrast in CT is okay   Morphine Itching and Rash   Penicillins Rash    Has patient had a PCN reaction causing immediate rash, facial/tongue/throat swelling, SOB or lightheadedness with hypotension: Y Has patient had a PCN reaction causing severe rash involving mucus membranes or skin  necrosis: Y Has patient had a PCN reaction that required hospitalization: N Has patient had a PCN reaction occurring within the last 10 years: Y If all of the above answers are "NO", then may proceed with Cephalosporin use.    Prednisone Rash    Social History:   Social History   Socioeconomic History   Marital status: Married    Spouse name: Not on file   Number of children: 0   Years of education: Not on file   Highest education level: Not on file  Occupational History   Occupation: [employer]  Tobacco Use   Smoking status: Never   Smokeless tobacco: Never  Vaping Use   Vaping Use: Never used  Substance and Sexual Activity   Alcohol use: Never   Drug use: Never   Sexual activity: Yes  Other Topics Concern   Not on file  Social History Narrative   Not on file   Social Determinants of Health   Financial Resource Strain: Low Risk    Difficulty of Paying Living Expenses: Not very hard  Food Insecurity: No Food Insecurity   Worried About Charity fundraiser in the Last Year: Never true   [place] in the Last Year: Never true  Transportation Needs: No Transportation Needs   Lack of Transportation (Medical): No   Lack of Transportation (Non-Medical): No  Physical Activity: Not on file  Stress: Not on file  Social Connections: Not on file  Intimate Partner Violence: Not on file    Family History:    Family History  Problem Relation Age of Onset   Diabetes Mother     Hypertension Mother    Hyperlipidemia Mother    Thyroid disease Mother    Hypertension Father    Diabetes Father    Pancreatic cancer Paternal Aunt    Pancreatic cancer Paternal Uncle    Colon cancer Neg Hx    Esophageal cancer Neg Hx    Stomach cancer Neg Hx      ROS:   Constitutional:see HPI   Eyes: Denied vision change or loss Ears/Nose/Mouth/Throat: Denied ear ache, sore throat, coughing, sinus pain Cardiovascular: denied chest pain/pressure Respiratory: denied shortness of breath Gastrointestinal: see HPI  Genital/Urinary: Denied dysuria, hematuria, urinary frequency/urgency Musculoskeletal: see HPI  Skin: Denied rash, wound Neuro: Denied headache, dizziness, syncope Psych: Denied history of depression/anxiety  Endocrine: history of diabetes      Physical Exam/Data:   Vitals:   08/15/21 0400 08/15/21 0430 08/15/21 0500 08/15/21 0807  BP: 118/75  123/75 118/83  Pulse: (!) 109  (!) 110 (!) 108  Resp: 18  20 20   Temp:  98.1 F (36.7 C)  97.9 F (36.6 C)  TempSrc:  Oral  Oral  SpO2: 95%  94% 95%  Weight:  65 kg    Height:        Intake/Output Summary (Last 24 hours) at 08/15/2021 0856 Last data filed at 08/15/2021 0600 Gross per 24 hour  Intake 2309.51 ml  Output --  Net 2309.51 ml      08/15/2021    4:30 AM 08/14/2021    3:24 AM 08/13/2021    5:57 PM  Last 3 Weights  Weight (lbs) 143 lb 4.8 oz 143 lb 4.8 oz 141 lb  Weight (kg) 65 kg 65 kg 63.957 kg     Body mass index is 31.01 kg/m.   Vitals:  Vitals:   08/15/21 0500 08/15/21 0807  BP: 123/75 118/83  Pulse: (!) 110 (!)  108  Resp: 20 20  Temp:  97.9 F (36.6 C)  SpO2: 94% 95%   General Appearance: In no apparent distress, laying in bed HEENT: Normocephalic, atraumatic.  Neck: Supple, trachea midline, no JVDs Cardiovascular: Regular rhythm, normal S1-S2,  no murmur Respiratory: Resting breathing unlabored, lungs sounds clear to auscultation bilaterally, no use of accessory muscles. On room air.     Gastrointestinal: Bowel sounds positive, abdomen soft and obese  Extremities: Able to move all extremities in bed without difficulty, no edema of BLE  Musculoskeletal: Normal muscle bulk and tone Skin: Intact, warm, dry. No rashes or petechiae noted in exposed areas.  Neurologic: Alert, oriented to person, place and time. Fluent speech, no facial droop, no cognitive deficit Psychiatric: Normal affect. Mood is appropriate.     EKG:  The EKG was personally reviewed and demonstrates:  Sinus tachycardia 110  Telemetry:  Telemetry was personally reviewed and demonstrates:  Sinus tachycardia up to 140s   Relevant CV Studies:  Right Heart catheterization on 08/05/2021:  RA = 12 prominent V waves RV = 47/14 PA = 48/25 (37) PCW = 25 Fick cardiac output/index = 4.4/2.8 PVR =3.7 WU Ao sat = 99% PA sat = 62%, 62%   Assessment: 1. Elevated filling pressures with normal cardiac output 2. Mild to moderate mixed pulmonary HTN with prominent v-waves in RA suggestive of significant TR   Plan/Discussion:   Diurese.  Wil give dose of IV lasix prior to d/c. Add metolazone 2.5 + Kcl 40 once a week. Recheck echo to look at TV. Can consider cautious trial of selective pulmonary vasodilators once fluid status improved.   Echocardiogram on 03/10/2021:   1. Left ventricular ejection fraction, by estimation, is 45%. Left  ventricular ejection fraction by 3D volume is 46 %. The left ventricle has  mildly decreased function. The left ventricle demonstrates regional wall  motion abnormalities (basal inferior  WMAs).   2. Right ventricular systolic function is normal. The right ventricular  size is normal.   3. The mitral valve is normal in structure. No evidence of mitral valve  regurgitation.   4. The aortic valve is tricuspid. Aortic valve regurgitation is trivial.   Comparison(s): No significant change from prior study.  Right and left heart cath on 12/03/2020:  Right and left heart  catheterization 12/03/2020: Normal right heart catheterization pressures.  RA 4/1, mean 1; RV 23/4, EDP 6; PA 22/8, mean 11 and PA saturation 76%.  PW 9/7, mean 7 mmHg. CO 5.6, CI 3.88.  QP/QS 1.00. LV: 136/9, EDP 17 mmHg.  Ao 131/86, mean 105 mmHg.  No pressure gradient across the aortic valve. RCA: A very small but dominant RCA which is diffusely diseased around 80%.  Mid segment 99% occlusion.  Conus branch collateralizes distal RCA. LM: Large, smooth and normal. LAD: Large vessel, minimal diffuse disease.  Small to moderate-sized D1 and D2. CX: Large vessel.  Minimal diffuse disease.  Gives origin to large OM1 and OM 2.   Impression: Nonischemic cardiomyopathy, single vessel occlusion does not explain her markedly reduced LVEF.  Medical therapy for CAD and nonischemic cardiomyopathy.  I have started her on carvedilol 3.125 mg twice daily.   Cardiac MRI on 12/01/2020:  1. Midwall late gadolinium enhancement in basal to mid anterior/anterolateral walls. Differential diagnosis includes prior myocarditis (normal T2 values suggests no evidence of acute myocarditis) versus sarcoidosis. Would recommend cardiac PET to evaluate for sarcoid.   2.  Mild LV dilatation with severe systolic dysfunction (EF 63%)  3.  Normal RV size and systolic function (EF 81%)   4. Moderate pericardial effusion measuring up to 1cm adjacent to RV free wall    Laboratory Data:  High Sensitivity Troponin:  No results for input(s): TROPONINIHS in the last 720 hours.   Chemistry Recent Labs  Lab 08/14/21 0417 08/14/21 1158 08/14/21 2039 08/15/21 0003 08/15/21 0323 08/15/21 0556  NA 130*  133*  --  129*  --  132*  --   K 4.8  4.9   < > 5.6* 4.9 4.9 5.0  CL 96*  97*  --  97*  --  98  --   CO2 19*  18*  --  18*  --  19*  --   GLUCOSE 264*  261*  --  461*  --  292*  --   BUN 131*  130*  --  129*  --  117*  --   CREATININE 3.44*  3.42*  --  2.92*  --  2.43*  --   CALCIUM 9.6  9.8  --  8.5*  --  9.1   --   GFRNONAA 18*  18*  --  21*  --  27*  --   ANIONGAP 15  18*  --  14  --  15  --    < > = values in this interval not displayed.    Recent Labs  Lab 08/13/21 1941 08/14/21 0417  PROT 8.3* 7.9  ALBUMIN 4.4 4.2  AST 78* 59*  ALT 103* 87*  ALKPHOS 72 79  BILITOT 0.6 0.6   Lipids No results for input(s): CHOL, TRIG, HDL, LABVLDL, LDLCALC, CHOLHDL in the last 168 hours.  Hematology Recent Labs  Lab 08/10/21 1445 08/14/21 0417 08/15/21 0323  WBC 6.2 6.0 6.2  RBC 4.05 4.17 3.84*  HGB 12.1 12.4 11.5*  HCT 36.3 36.8 33.4*  MCV 89.6 88.2 87.0  MCH 29.9 29.7 29.9  MCHC 33.3 33.7 34.4  RDW 15.8* 15.7* 15.5  PLT 197 153 142*   Thyroid No results for input(s): TSH, FREET4 in the last 168 hours.  BNP Recent Labs  Lab 08/10/21 1443 08/13/21 1837  BNP 18.3 11.6    DDimer No results for input(s): DDIMER in the last 168 hours.   Radiology/Studies:  DG Chest 2 View  Result Date: 08/13/2021 CLINICAL DATA:  Shortness of breath, CHF EXAM: CHEST - 2 VIEW COMPARISON:  05/13/2021 FINDINGS: Cardiac and mediastinal contours are within normal limits given AP technique. No focal pulmonary opacity. No pleural effusion or pneumothorax. No acute osseous abnormality. IMPRESSION: No acute cardiopulmonary process. Electronically Signed   By: Merilyn Baba M.D.   On: 08/13/2021 19:25   US RENAL  Result Date: 08/14/2021 CLINICAL DATA:  Acute kidney injury. EXAM: RENAL / URINARY TRACT ULTRASOUND COMPLETE COMPARISON:  Renal ultrasound 05/13/2021. FINDINGS: Right Kidney: Renal measurements: 10.1 x 5.0 x 4.6 cm = volume: 14.1 mL. Echogenicity within normal limits. No mass or hydronephrosis visualized. Left Kidney: Renal measurements: 11.5 x 4.8 x 4.5 cm = volume: 132 mL. Echogenicity within normal limits. No mass or hydronephrosis visualized. Bladder: Appears normal for degree of bladder distention. Other: None. IMPRESSION: Normal renal ultrasound Electronically Signed   By: Ronney Asters M.D.   On:  08/14/2021 15:09     Assessment and Plan:   AKI on CKD IIIa Hyperkalemia - Cr 3.59 and potassium 7.5 POA, baseline Cr around 1-1.2 ranges -Renal ultrasound from 08/14/2021 negative for hydronephrosis -Renal index had improved with gentle IV fluids and  holding diuretics, etiology likely overdiuresis associated prerenal AKI - Cr 2.43 today, would continue hold diuretics and Entresto until further renal recovery  -Monitor daily renal index and urine output  Chronic systolic and diastolic heart failure Nonischemic cardiomyopathy - followed by AHF clinic, last seen 08/10/21, volume overloaded, was increased on torsemide 624m BID and added metolazone 2.562mweekly +KCL 40 - GDMT: Historically maintained on carvedilol 6.25 mg twice daily, spironolactone 12.5 mg daily, Corlanor 7.5 mg twice daily, Entresto 24/26 twice daily; off digoxin given improved EF; off SGLT2 due to side effect per Endo -Recommend hold torsemide, metolazone, spironolactone, and Entresto until recovery of AKI -Continue carvedilol 6.25 twice daily, resume Corlanor 7.24m48mID today  -will get ReDs reading tomorrow to evaluate volume status  - will ask AHF team to see tomorrow   CAD -No chest pain, single-vessel RCA occlusion on cath 11/2020 -Continue aspirin, carvedilol, statin, and Repatha  Tricuspid regurgitation -Recent right heart cath revealed prominent V wave in RA -will repeat echocardiogram here    Uncontrolled type IIIc diabetes with hyperglycemia Hypertriglyceridemia 2/2 LPL deficiency Chronic pancreatic insufficiency/pancreatitis Chronic abdominal swelling/bloating/pain/diarrhea Nonalcoholic fatty liver disease Hypothyroidism Chronic hyponatremia Chronic transaminitis GERD Polyneuropathy Gastroparesis -Managed per internal medicine   Risk Assessment/Risk Scores:  { New York Heart Association (NYHA) Functional Class NYHA Class I  For questions or updates, please contact [facility] Please consult  [email or web address] for contact info under    Signed, XikMargie BilletP  08/15/2021 8:56 AM  Cardiology Attending  Patient seen and examined. Agree with the findings as noted above. The patient is a pleasant [age] woman with a complex and extensive past medical history who presents with dehydration, hyperkalemia and worsening renal insufficiency after uptitration of her diuretic in the adv CHF clinic. We are asked to see her to help with her CHF meds and additional recs. Her diuretic and her EntDelene Lollve been held as has the corleanor. She feels better. Her bp is improved and her renal function is still not at baseline but is improving. Her exam is notable for a pleasant [age] woman NAD. Lungs are clear and CV reveals a reg tachy with a summation gallop. Ext are warm. Abd is minimally distended. No guarding. Tele demonstrates sinus tachy. A/P Acute dehydration - she has been gently hydrated. Will hold off on re-initiation of her diuretic but suspect that tomorrow will be time.  Sinus tachy - we will restart corleanor. Continue coreg. CAD - despite resting tachy, no angina. Chronic systolic heart failure - hopefully we can restart entresto once her renal function improves.  GreCarleene Overlieylor,MD

## 2021-08-15 NOTE — Progress Notes (Signed)
PROGRESS NOTE    Jennifer [name]  ZOX:[id number] DOB: [birth year] DOA: 08/13/2021 PCP: Sue Lush, PA-C   Brief Narrative:  HPI: Jennifer [name] is a pleasant 31 y.o. female with medical history significant for astrocytoma status postresection and chemotherapy, hypertriglyceridemia, chronic pancreatitis, thyroidism, CKD 3A, insulin-dependent diabetes mellitus, and nonischemic cardiomyopathy with EF 45%, now presenting to the emergency department with general weakness and fatigue.  Patient recently increased her torsemide, metolazone, and potassium supplement, had blood work concerning for acute renal failure and hyperkalemia, and began holding her diuretics and potassium yesterday.  She denies chest pain, cough, or shortness of breath at rest but has had some orthopnea for the past 1 to 2 months.  She denies any leg swelling, notes that her legs never seem to swell, but has noticed increased abdominal girth over the past few weeks.   ED Course: Upon arrival to the ED, patient is found to be afebrile and saturating well on room air with slightly elevated heart rate and stable blood pressure.  EKG features sinus tachycardia with rate under 1 and peaked T waves.  Chest x-ray negative for acute cardiopulmonary disease.  Chemistry panel notable for glucose 387, potassium >7.5, bicarbonate 17, BUN 130, creatinine 3.59, and mild elevation in transaminases.  BNP was 12.  Patient was treated with Lokelma, insulin, and IV calcium gluconate in the ED.    Assessment & Plan:   Principal Problem:   Hyperkalemia Active Problems:   Acute renal failure superimposed on stage 3a chronic kidney disease (HCC)   Nonischemic cardiomyopathy (HCC)   Elevated transaminase level   Hx of insulin dependent diabetes mellitus  Hyperkalemia: She was given Lokelma, insulin and IV calcium gluconate in the ED.  Present with potassium over 7.  Initially hyperkalemia resolved but then she had hyperkalemia again requiring  another round of Lokelma 3 times along with insulin and dextrose.  Potassium normalized again today.  Will discontinue Lokelma and continue to monitor potassium every 4 hours.  AKI on CKD stage IIIa: Baseline creatinine as a few weeks ago around 1.3-1.5.  Pt with CKD IIIa recently increased diuretics and potassium supplement and found to have acute renal failure with BUN 130, SCr 3.59, serum bicarb 17.  Slight improvement in creatinine further today and creatinine down to 2.43.  Continue IV fluids.  Continue to hold all nephrotoxic agents which include Entresto, torsemide, metolazone, Aldactone.  Chronic systolic CHF: Appears to be euvolemic.  Holding all diuretics and Entresto, cardiology consulted, they have resumed her Corlanor and she remains on Coreg.  Heart failure team to see tomorrow.  Insulin-dependent type 2 diabetes mellitus: Hemoglobin A1c 7.9.  Followed by [facility] endocrinology.  Appears to be on very high dose of concentrated Humulin to 60 units 3 times daily with each meal and 100 units at bedtime.  Currently she is on Semglee 75 units, NovoLog 10 units 3 times daily Premeal and SSI, blood sugar is elevated in 300 range, increase Semglee to 95 units 3 times daily, NovoLog to 12 units 3 times daily and change sliding scale from sensitive to regular.  Elevated transaminases  - AST is 78 and ALT 103 on admission with normal alk phos and bili, benign abdominal exam  - She has hx of steatosis attributed to markedly elevated triglycerides.  Viral hepatitis panel is negative.  History of peripheral neuropathy.  Lyrica continued.  GERD: Continue PPI.  DVT prophylaxis: heparin injection 5,000 Units Start: 08/14/21 0600   Code Status: Full Code  Family Communication:  None present at bedside.  Plan of care discussed with patient in length and he/she verbalized understanding and agreed with it.  Status is: Inpatient Remains inpatient appropriate because: Needs IV fluids and further monitoring  and adjustment of heart failure medications.   Estimated body mass index is 31.01 kg/m as calculated from the following:   Height as of this encounter: _0  (1.448 m).   Weight as of this encounter: 65 kg.    Nutritional Assessment: Body mass index is 31.01 kg/m.Marland Kitchen Seen by dietician.  I agree with the assessment and plan as outlined below: Nutrition Status:        . Skin Assessment: I have examined the patient's skin and I agree with the wound assessment as performed by the wound care RN as outlined below:    Consultants:  None  Procedures:  None  Antimicrobials:  Anti-infectives (From admission, onward)    None         Subjective:  Seen and examined.  She has no complaints.  Objective: Vitals:   08/15/21 0400 08/15/21 0430 08/15/21 0500 08/15/21 0807  BP: 118/75  123/75 118/83  Pulse: (!) 109  (!) 110 (!) 108  Resp: _1 Temp:  98.1 F (36.7 C)  97.9 F (36.6 C)  TempSrc:  Oral  Oral  SpO2: 95%  94% 95%  Weight:  65 kg    Height:        Intake/Output Summary (Last 24 hours) at 08/15/2021 1117 Last data filed at 08/15/2021 3235 Gross per 24 hour  Intake 2309.51 ml  Output --  Net 2309.51 ml    Filed Weights   08/13/21 1757 08/14/21 0324 08/15/21 0430  Weight: 64 kg 65 kg 65 kg    Examination:  General exam: Appears calm and comfortable  Respiratory system: Clear to auscultation. Respiratory effort normal. Cardiovascular system: S1 & S2 heard, RRR. No JVD, murmurs, rubs, gallops or clicks. No pedal edema. Gastrointestinal system: Abdomen is nondistended, soft and nontender. No organomegaly or masses felt. Normal bowel sounds heard. Central nervous system: Alert and oriented. No focal neurological deficits. Extremities: Symmetric 5 x 5 power. Skin: No rashes, lesions or ulcers.  Psychiatry: Judgement and insight appear normal. Mood & affect appropriate.   Data Reviewed: I have personally reviewed following labs and imaging  studies  CBC: Recent Labs  Lab 08/10/21 1445 08/14/21 0417 08/15/21 0323  WBC 6.2 6.0 6.2  HGB 12.1 12.4 11.5*  HCT 36.3 36.8 33.4*  MCV 89.6 88.2 87.0  PLT 197 153 142*    Basic Metabolic Panel: Recent Labs  Lab 08/12/21 0919 08/13/21 1941 08/14/21 0111 08/14/21 0417 08/14/21 1158 08/14/21 1625 08/14/21 2039 08/15/21 0003 08/15/21 0323 08/15/21 0556  NA 131* 131*  --  130*  133*  --   --  129*  --  132*  --   K 5.8* >7.5*   < > 4.8  4.9   < > 7.0* 5.6* 4.9 4.9 5.0  CL 91* 98  --  96*  97*  --   --  97*  --  98  --   CO2 WILL FOLLOW 17*  --  19*  18*  --   --  18*  --  19*  --   GLUCOSE 170* 387*  --  264*  261*  --   --  461*  --  292*  --   BUN 110* 130*  --  131*  130*  --   --  129*  --  117*  --   CREATININE 3.18* 3.59*  --  3.44*  3.42*  --   --  2.92*  --  2.43*  --   CALCIUM 10.6* 9.1  --  9.6  9.8  --   --  8.5*  --  9.1  --    < > = values in this interval not displayed.    GFR: Estimated Creatinine Clearance: 26.3 mL/min (A) (by C-G formula based on SCr of 2.43 mg/dL (H)). Liver Function Tests: Recent Labs  Lab 08/13/21 1941 08/14/21 0417  AST 78* 59*  ALT 103* 87*  ALKPHOS 72 79  BILITOT 0.6 0.6  PROT 8.3* 7.9  ALBUMIN 4.4 4.2    No results for input(s): LIPASE, AMYLASE in the last 168 hours. No results for input(s): AMMONIA in the last 168 hours. Coagulation Profile: No results for input(s): INR, PROTIME in the last 168 hours. Cardiac Enzymes: No results for input(s): CKTOTAL, CKMB, CKMBINDEX, TROPONINI in the last 168 hours. BNP (last 3 results) No results for input(s): PROBNP in the last 8760 hours. HbA1C: Recent Labs    08/15/21 0556  HGBA1C 7.9*   CBG: Recent Labs  Lab 08/14/21 0550 08/14/21 1138 08/14/21 1700 08/14/21 2110 08/15/21 0638  GLUCAP 289* 357* 343* 406* 325*    Lipid Profile: No results for input(s): CHOL, HDL, LDLCALC, TRIG, CHOLHDL, LDLDIRECT in the last 72 hours. Thyroid Function Tests: No  results for input(s): TSH, T4TOTAL, FREET4, T3FREE, THYROIDAB in the last 72 hours. Anemia Panel: No results for input(s): VITAMINB12, FOLATE, FERRITIN, TIBC, IRON, RETICCTPCT in the last 72 hours. Sepsis Labs: No results for input(s): PROCALCITON, LATICACIDVEN in the last 168 hours.  No results found for this or any previous visit (from the past 240 hour(s)).   Radiology Studies: DG Chest 2 View  Result Date: 08/13/2021 CLINICAL DATA:  Shortness of breath, CHF EXAM: CHEST - 2 VIEW COMPARISON:  05/13/2021 FINDINGS: Cardiac and mediastinal contours are within normal limits given AP technique. No focal pulmonary opacity. No pleural effusion or pneumothorax. No acute osseous abnormality. IMPRESSION: No acute cardiopulmonary process. Electronically Signed   By: Merilyn Baba M.D.   On: 08/13/2021 19:25   US RENAL  Result Date: 08/14/2021 CLINICAL DATA:  Acute kidney injury. EXAM: RENAL / URINARY TRACT ULTRASOUND COMPLETE COMPARISON:  Renal ultrasound 05/13/2021. FINDINGS: Right Kidney: Renal measurements: 10.1 x 5.0 x 4.6 cm = volume: 14.1 mL. Echogenicity within normal limits. No mass or hydronephrosis visualized. Left Kidney: Renal measurements: 11.5 x 4.8 x 4.5 cm = volume: 132 mL. Echogenicity within normal limits. No mass or hydronephrosis visualized. Bladder: Appears normal for degree of bladder distention. Other: None. IMPRESSION: Normal renal ultrasound Electronically Signed   By: Ronney Asters M.D.   On: 08/14/2021 15:09    Scheduled Meds:  aspirin EC  81 mg Oral Daily   carvedilol  6.25 mg Oral BID   DULoxetine  30 mg Oral QHS   fenofibrate  160 mg Oral Daily   heparin  5,000 Units Subcutaneous Q8H   insulin aspart  0-15 Units Subcutaneous TID WC   insulin aspart  0-5 Units Subcutaneous QHS   insulin aspart  12 Units Subcutaneous TID WC   insulin glargine-yfgn  85 Units Subcutaneous BID   ivabradine  7.5 mg Oral BID WC   levothyroxine  125 mcg Oral Q0600   pantoprazole  40 mg Oral  Daily   pregabalin  150 mg Oral QHS   sodium chloride flush  3 mL Intravenous Q12H   sodium zirconium cyclosilicate  10 g Oral TID   Continuous Infusions:  sodium chloride 100 mL/hr at 08/15/21 0532     LOS: 1 day   Darliss Cheney, MD [facility]  08/15/2021, 11:17 AM   *Please note that this is a verbal dictation therefore any spelling or grammatical errors are due to the "[employer]" system interpretation.  Please page via [place] and do not message via secure chat for urgent patient care matters. Secure chat can be used for non urgent patient care matters.  How to contact the [facility] Attending or Consulting provider [place] or covering provider during after hours [place], for this patient?  Check the care team in [facility] and look for a) attending/consulting TR[facility] provider listed and b) the [facility] team listed. Page or secure chat [phone]. Log into [email or web address] and use 's universal password to access. If you do not have the password, please contact the hospital operator. Locate the [facility] provider you are looking for under [facility] and page to a number that you can be directly reached. If you still have difficulty reaching the provider, please page the [facility] (Director on Call) for the Hospitalists listed on amion for assistance.

## 2021-08-16 DIAGNOSIS — I5022 Chronic systolic (congestive) heart failure: Secondary | ICD-10-CM

## 2021-08-16 DIAGNOSIS — E875 Hyperkalemia: Secondary | ICD-10-CM | POA: Diagnosis not present

## 2021-08-16 LAB — CBC
HCT: 32.2 % — ABNORMAL LOW (ref 36.0–46.0)
Hemoglobin: 11.2 g/dL — ABNORMAL LOW (ref 12.0–15.0)
MCH: 30.7 pg (ref 26.0–34.0)
MCHC: 34.8 g/dL (ref 30.0–36.0)
MCV: 88.2 fL (ref 80.0–100.0)
Platelets: 142 10*3/uL — ABNORMAL LOW (ref 150–400)
RBC: 3.65 MIL/uL — ABNORMAL LOW (ref 3.87–5.11)
RDW: 15.3 % (ref 11.5–15.5)
WBC: 5.4 10*3/uL (ref 4.0–10.5)
nRBC: 0 % (ref 0.0–0.2)

## 2021-08-16 LAB — BASIC METABOLIC PANEL
Anion gap: 12 (ref 5–15)
BUN: 83 mg/dL — ABNORMAL HIGH (ref 6–20)
CO2: 20 mmol/L — ABNORMAL LOW (ref 22–32)
Calcium: 9 mg/dL (ref 8.9–10.3)
Chloride: 105 mmol/L (ref 98–111)
Creatinine, Ser: 2.02 mg/dL — ABNORMAL HIGH (ref 0.44–1.00)
GFR, Estimated: 33 mL/min — ABNORMAL LOW (ref >60)
Glucose, Bld: 288 mg/dL — ABNORMAL HIGH (ref 70–99)
Potassium: 4.7 mmol/L (ref 3.5–5.1)
Sodium: 137 mmol/L (ref 135–145)

## 2021-08-16 LAB — GLUCOSE, CAPILLARY
Glucose-Capillary: 196 mg/dL — ABNORMAL HIGH (ref 70–99)
Glucose-Capillary: 372 mg/dL — ABNORMAL HIGH (ref 70–99)
Glucose-Capillary: 212 mg/dL — ABNORMAL HIGH (ref 70–99)
Glucose-Capillary: 240 mg/dL — ABNORMAL HIGH (ref 70–99)

## 2021-08-16 LAB — POTASSIUM: Potassium: 4.4 mmol/L (ref 3.5–5.1)

## 2021-08-16 MED ORDER — INSULIN ASPART 100 UNIT/ML IJ SOLN
20.0000 [IU] | Freq: Three times a day (TID) | INTRAMUSCULAR | Status: DC
  Administered 2021-08-16 (×2): 20 [IU] via SUBCUTANEOUS

## 2021-08-16 MED ORDER — INSULIN GLARGINE-YFGN 100 UNIT/ML ~~LOC~~ SOLN
100.0000 [IU] | Freq: Two times a day (BID) | SUBCUTANEOUS | Status: DC
  Administered 2021-08-16 – 2021-08-17 (×2): 100 [IU] via SUBCUTANEOUS
  Filled 2021-08-16 (×3): qty 1

## 2021-08-16 MED ORDER — TORSEMIDE 20 MG PO TABS
40.0000 mg | ORAL_TABLET | Freq: Two times a day (BID) | ORAL | Status: DC
  Administered 2021-08-17: 40 mg via ORAL
  Filled 2021-08-16: qty 2

## 2021-08-16 NOTE — Progress Notes (Addendum)
Inpatient Diabetes Program Recommendations  AACE/ADA: New Consensus Statement on Inpatient Glycemic Control (2015)  Target Ranges:  Prepandial:   less than 140 mg/dL      Peak postprandial:   less than 180 mg/dL (1-2 hours)      Critically ill patients:  140 - 180 mg/dL   Lab Results  Component Value Date   GLUCAP 196 (H) 08/16/2021   HGBA1C 7.9 (H) 08/15/2021    Review of Glycemic Control  Latest Reference Range & Units 08/15/21 11:22 08/15/21 17:00 08/15/21 21:23 08/16/21 06:06  Glucose-Capillary 70 - 99 mg/dL 292 (H) 288 (H) 333 (H) 196 (H)  (H): Data is abnormally high Diabetes history: Secondary Diabetes per Dr Garrel Ridgel (outpt endocrinology) Outpatient Diabetes medications: U500 260 units TID, U500 100 units QHS Current orders for Inpatient glycemic control: Novolog 0-15 units TID, Novolog 0-5 units QHS, Semglee 95 units BID, Novolog 12 units TID  Inpatient Diabetes Program Recommendations:    Consider increasing Semglee to 100 units BID and increasing Novolog to 20 units TID (Assuming patient is consuming >50% of meals).    Thanks, Bronson Curb, MSN, RNC-OB Diabetes Coordinator [phone] (8a-5p)

## 2021-08-16 NOTE — Consult Note (Addendum)
Advanced Heart Failure Team Consult Note   Primary Physician: Sue Lush, PA-C PCP-Cardiologist:  Dr. Einar Gip  [facility]: Dr. Haroldine Laws   Reason for Consultation: Chronic Systolic Heart Failure Management in Setting of AKI and Hyperkalemia   HPI:    Jennifer [name] is seen today for evaluation of chronic systolic heart failure management at the request of  Dr. Doristine Bosworth, Internal Medicine.   Jennifer [name] is a 31 y.o. 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   Has been followed by Dr. Einar Gip for her HF.    Admitted 11/22/20 with ab pain and diarrhea. Ab CT concerning for vasculitis (no pancreatitis). Her TG were found to be  4030. She was started on an insulin/dextrose infusion. Cardiology and Nephrology were consulted after her creatinine and LFTs continued to rise and she began developing hypotension. Echo 11/28/20 EF was found to be 20-25% with moderate RV dysfunction and mild septal flattening. Started on DBA 2.5. AHF consulted for further management. PICC placed to follow CVP and Co-Ox. BP were high and milrinone added to augment diuresis. cMRI c/w prior myocarditis vs sarcoid (see below). Mod pericardial effusion also noted. LVEF 29%. RV normal. She underwent R/LHC showing single vessel RCA occlusion, RA 1, LVEDP 17, Fick CO 5.6/CI 3.88. She was able to be weaned off inotropes and GDMT titrated.     Post hospital follow up she was drinking >2L/day fluid and eating more salty foods, volume was up, ReDs 43%, instructed to increase lasix to 20 mg bid x 3 days. She followed up with [name] with [facility] 12/17/20 for a 2nd opinion. He placed LifeVest and switched beta blocker to Toprol XL.   Called after hours service with swelling, instructed to continue lasix 20 mg bid.   Seen in ED 12/25/20 for fall.  She was standing at work and became dizzy. She fell backwards and hit her head. CT neg, labs showed K 5.7. She was given 1 dose of Lokelma. She denied palpitations.   Volume overloaded at follow up 01/06/21, lasix switched to torsemide. She had a LifeVest per Cardiology at [facility].   Echo (10/22): EF up to 45-50%, grade II DD, RV ok. LifeVest off.   Treated in ED 10/22 for pancreatitis, then admitted 11/22 for hyperglycemia, did not meet criteria for DKA.  Hospitalization c/b hyponatremia, sodium 118. Concern for volume overload. Lasix restarted but SCr increased. Nephrology consulted and restarted gentle IVF and GDMT held for a couple of days. Recommendation for Tolvaptan, however patient declined and requested discharge with outpatient follow-up her MD at [facility]. Jardiance stopped at discharge, weight 129 lbs.    Seen back in ED a few days later with increased weight and abd distention. Given IV lasix in ED and torsemide increased to 40 bid.   [facility] visit 11/22, her breathing had improved but she complained of abdominal swelling/bloating, not felt to be related to volume overload. Concern for possible gastroparesis. She was referred to GI. They recommended abdominal CT. This was completed 12/9 and was unremarkable. Ceruloplasmin level was also low, raising concern for possible Wilson's Disease. However had further w/u w/ [facility] and reports Wilson's disease was ruled out. Additional GI testing unremarkable.    Echo 12/22 EF 45%, RV normal.    Follow up 08/02/21, weight up 16 lbs, ReDs 27%. + abdominal swelling and bloating and remained  tachycardic. GI work up without explanation for symptoms. Arranged for RHC to better assess hemodynamics and see if abdominal symptoms related to right-sided HF due to high-output state or non-cardiac (see results below).    [place] 5/23 with elevated filling pressures and normal cardiac ouput. Mild to moderate mixed pulmonary hypertension with prominent v-waves in RA,  suggestive of significant TR. Given IV lasix and metolazone 2.5 added weekly to diuretic regimen.    RA = 12 prominent V waves RV = 47/14 PA = 48/25 (37) PCW = 25 Fick cardiac output/index = 4.4/2.8 PVR =3.7 WU Ao sat = 99% PA sat = 62%, 62%  Seen back in [facility] last week for post cath f/u. More SOB walking short distances on flat ground + increased palpitations. Was fluid overloaded w/ elevated ReDs 43% Reported compliance w/ diuretics but admitted to dietary indiscretion w/ sodium and high fluid intake. Torsemide was increased to 60 mg bid and instructed to take an extra dose of metolazone that week.    Presented to ED on 5/20 w/ complaints of generalized weakness and fatigue. BUN 130, creatinine 3.59, and mild elevation in transaminases.  BNP was 12.  Patient was treated with Lokelma, insulin, and IV calcium gluconate in the ED. CXR negative for acute cardiopulmonary process. BNP 12.   She was admitted and hydrated w/ IVFs. HF meds/diuretics held. SCr improving, 3.59>>3.44>>2.92>>2.02. K down to 4.4 today.   Echo done yesterday, EF 45-50%, RV normal, mild-mod MR, RVSP 30. Only mild TR   Feels better today. Muscle weakness resolved. IVFs still running. Denies dyspnea. No orthopnea/PND.    Relevant CV Studies:   Right Heart catheterization on 08/05/2021:   RA = 12 prominent V waves RV = 47/14 PA = 48/25 (37) PCW = 25 Fick cardiac output/index = 4.4/2.8 PVR =3.7 WU Ao sat = 99% PA sat = 62%, 62%   Assessment: 1. Elevated filling pressures with normal cardiac output 2. Mild to moderate mixed pulmonary HTN with prominent v-waves in RA suggestive of significant TR   Plan/Discussion:   Diurese.  Wil give dose of IV lasix prior to d/c. Add metolazone 2.5 + Kcl 40 once a week. Recheck echo to look at TV. Can consider cautious trial of selective pulmonary vasodilators once fluid status improved.   Echo 08/15/21 Hypokinesis of the infeirior, inferoseptal walls . Left ventricular  ejection fraction, by estimation, is 45 to 50%. The left ventricle has mildly decreased function. 1. Right ventricular systolic function is normal. The right ventricular size is normal. There is normal pulmonary artery systolic pressure. 2. 3. . The mitral valve is normal in structure. Mild to moderate mitral valve regurgitation. 4. The aortic valve is normal in structure. Aortic valve regurgitation is mild.   Echocardiogram on 03/10/2021:    1. Left ventricular ejection fraction, by estimation, is 45%. Left  ventricular ejection fraction by 3D volume is 46 %. The left ventricle has  mildly decreased function. The left ventricle demonstrates regional wall  motion abnormalities (basal inferior  WMAs).   2. Right ventricular systolic function is normal. The right ventricular  size is normal.   3. The mitral valve is normal in structure. No evidence of mitral valve  regurgitation.   4. The aortic valve is tricuspid. Aortic valve regurgitation is trivial.   Comparison(s): No significant change from prior study.   Right and left heart cath on 12/03/2020:   Right and left heart catheterization 12/03/2020: Normal right heart catheterization pressures.  RA 4/1, mean 1;  RV 23/4, EDP 6; PA 22/8, mean 11 and PA saturation 76%.  PW 9/7, mean 7 mmHg. CO 5.6, CI 3.88.  QP/QS 1.00. LV: 136/9, EDP 17 mmHg.  Ao 131/86, mean 105 mmHg.  No pressure gradient across the aortic valve. RCA: A very small but dominant RCA which is diffusely diseased around 80%.  Mid segment 99% occlusion.  Conus branch collateralizes distal RCA. LM: Large, smooth and normal. LAD: Large vessel, minimal diffuse disease.  Small to moderate-sized D1 and D2. CX: Large vessel.  Minimal diffuse disease.  Gives origin to large OM1 and OM 2.   Impression: Nonischemic cardiomyopathy, single vessel occlusion does not explain her markedly reduced LVEF.  Medical therapy for CAD and nonischemic cardiomyopathy.  I have started her on  carvedilol 3.125 mg twice daily.     Cardiac MRI on 12/01/2020:   1. Midwall late gadolinium enhancement in basal to mid anterior/anterolateral walls. Differential diagnosis includes prior myocarditis (normal T2 values suggests no evidence of acute myocarditis) versus sarcoidosis. Would recommend cardiac PET to evaluate for sarcoid.   2.  Mild LV dilatation with severe systolic dysfunction (EF 76%)   3.  Normal RV size and systolic function (EF 73%)   4. Moderate pericardial effusion measuring up to 1cm adjacent to RV free wall    Review of Systems: [y] = yes, [ ]  = no   General: Weight gain [ ] ; Weight loss [ ] ; Anorexia [ ] ; Fatigue [Y ]; Fever [ ] ; Chills [ ] ; Weakness [ Y]  Cardiac: Chest pain/pressure [ ] ; Resting SOB [ ] ; Exertional SOB [ ] ; Orthopnea [ ] ; Pedal Edema [ ] ; Palpitations [ ] ; Syncope [ ] ; Presyncope [ ] ; Paroxysmal nocturnal dyspnea[ ]   Pulmonary: Cough [ ] ; Wheezing[ ] ; Hemoptysis[ ] ; Sputum [ ] ; Snoring [ ]   GI: Vomiting[ ] ; Dysphagia[ ] ; Melena[ ] ; Hematochezia [ ] ; Heartburn[ ] ; Abdominal pain [ ] ; Constipation [ ] ; Diarrhea [ ] ; BRBPR [ ]   GU: Hematuria[ ] ; Dysuria [ ] ; Nocturia[ ]   Vascular: Pain in legs with walking [ ] ; Pain in feet with lying flat [ ] ; Non-healing sores [ ] ; Stroke [ ] ; TIA [ ] ; Slurred speech [ ] ;  Neuro: Headaches[ ] ; Vertigo[ ] ; Seizures[ ] ; Paresthesias[ ] ;Blurred vision [ ] ; Diplopia [ ] ; Vision changes [ ]   Ortho/Skin: Arthritis [ ] ; Joint pain [ ] ; Muscle pain [Y ]; Joint swelling [ ] ; Back Pain [ ] ; Rash [ ]   Psych: Depression[ ] ; Anxiety[ ]   Heme: Bleeding problems [ ] ; Clotting disorders [ ] ; Anemia [ ]   Endocrine: Diabetes [ ] ; Thyroid dysfunction[ ]   Home Medications Prior to Admission medications   Medication Sig Start Date End Date Taking? Authorizing Provider  albuterol (VENTOLIN HFA) 108 (90 Base) MCG/ACT inhaler Inhale 1 puff into the lungs every 6 (six) hours as needed for wheezing or shortness of breath. 12/31/18  Yes  [provider]  aspirin EC 81 MG EC tablet Take 1 tablet (81 mg total) by mouth daily. Swallow whole. 12/07/20  Yes Nicole Kindred A, DO  carvedilol (COREG) 6.25 MG tablet Take 1 tablet (6.25 mg total) by mouth 2 (two) times daily. 03/11/21  Yes Shelly Coss, MD  cetirizine (ZYRTEC) 10 MG tablet Take 10 mg by mouth daily.   Yes [provider]  doxycycline (VIBRA-TABS) 100 MG tablet Take 100 mg by mouth 2 (two) times daily. 08/10/21  Yes [provider]  DULoxetine (CYMBALTA) 30 MG capsule Take 30 mg by mouth at bedtime.  01/28/19 12/16/21 Yes [provider]  erythromycin ophthalmic ointment Place one application. into the right eye at bedtime. 03/25/20  Yes [provider]  Evolocumab (REPATHA) 140 MG/ML SOSY Inject 140 mg into the skin See admin instructions. Inject 140 mg subcutaneously every other Sunday evening   Yes [provider]  fenofibrate 160 MG tablet Take 1 tablet (160 mg total) by mouth daily. 12/07/20  Yes Nicole Kindred A, DO  fluticasone (FLONASE) 50 MCG/ACT nasal spray Place 1 spray into both nostrils daily.   Yes [provider]  insulin regular human CONCENTRATED (HUMULIN R) 500 UNIT/ML injection Inject 260 Units into the skin See admin instructions. 260 units 3 times daily with each meal, 100 units at bedtime   Yes [provider]  ivabradine (CORLANOR) 7.5 MG TABS tablet Take 1 tablet (7.5 mg total) by mouth 2 (two) times daily with a meal. 08/02/21  Yes Penelope Fittro, Shaune Pascal, MD  JENCYCLA 0.35 MG tablet Take 1 tablet by mouth daily. 07/24/21  Yes [provider]  levothyroxine (SYNTHROID) 125 MCG tablet Take 1 tablet (125 mcg total) by mouth daily at 6 (six) AM. 12/07/20  Yes Nicole Kindred A, DO  lipase/protease/amylase (CREON) 36000 UNITS CPEP capsule Take 94,[id number] Units by mouth See admin instructions. 72,000 units with each meal, 36,000 units with snacks   Yes [provider]   metolazone (ZAROXOLYN) 2.5 MG tablet Take by mouth. 08/05/21  Yes [provider]  metroNIDAZOLE (FLAGYL) 250 MG tablet Take 250 mg by mouth 3 (three) times daily. 08/10/21  Yes [provider]  Multiple Vitamin (MULTIVITAMIN WITH MINERALS) TABS tablet Take 1 tablet by mouth daily. 12/07/20  Yes Nicole Kindred A, DO  nitroGLYCERIN (NITROSTAT) 0.4 MG SL tablet Place 1 tablet (0.4 mg total) under the tongue every 5 (five) minutes as needed for up to 25 days for chest pain. 06/16/21 08/03/22 Yes Krysteena Stalker, Shaune Pascal, MD  ofloxacin (OCUFLOX) 0.3 % ophthalmic solution Place 1 drop into both eyes in the morning, at noon, in the evening, and at bedtime. 09/02/20  Yes [provider]  ondansetron (ZOFRAN) 4 MG tablet Take 4 mg by mouth every 8 (eight) hours as needed for nausea or vomiting. 12/19/18  Yes [provider]  pantoprazole (PROTONIX) 40 MG tablet Take 40 mg by mouth daily. 12/24/19  Yes [provider]  pregabalin (LYRICA) 100 MG capsule Take 1-2 capsules (100-200 mg total) by mouth See admin instructions. 200 mg in the morning, 100 mg in the afternnon 08/05/21  Yes Oswell Say, Shaune Pascal, MD  pregabalin (LYRICA) 300 MG capsule Take 300 mg by mouth at bedtime.   Yes [provider]  prochlorperazine (COMPAZINE) 10 MG tablet Take 1 tablet (10 mg total) by mouth every 6 (six) hours as needed for nausea or vomiting. 01/18/21  Yes Molpus, John, MD  promethazine (PHENERGAN) 25 MG tablet Take 25 mg by mouth every 6 (six) hours as needed for vomiting or nausea. 03/26/21  Yes [provider]  rosuvastatin (CRESTOR) 40 MG tablet Take 40 mg by mouth daily. 08/09/21  Yes [provider]  sacubitril-valsartan (ENTRESTO) 49-51 MG Take 1 tablet by mouth every 12 (twelve) hours. 01/04/21  Yes [provider]  spironolactone (ALDACTONE) 25 MG tablet Take 12.5 mg by mouth daily. Pt takes half tab daily   Yes [provider]  torsemide  (DEMADEX) 20 MG tablet Take 2 tablets (40 mg total) by mouth 2 (two) times daily. 08/13/21  Yes Milford, Or[place]anna,  FNP  tretinoin (RETIN-A) 0.05 % cream Apply 1 application topically at bedtime. 01/17/21  Yes [provider]  JARDIANCE 10 MG TABS tablet Take 10 mg by mouth daily. Patient not taking: Reported on 08/14/2021 08/09/21   [provider]    Past Medical History: Past Medical History:  Diagnosis Date   Brain tumor ([place]) 03/29/1995   astrocytoma   CHF (congestive heart failure) (HCC)    Cholesterosis    DM (diabetes mellitus) ([place]) 10/10/2018   Fatty liver    HTN (hypertension) 10/10/2018   Hypothyroidism 10/10/2018   Lipoprotein deficiency    Lung disease    longevity long term   Pancreatitis    Polycystic ovary syndrome     Past Surgical History: Past Surgical History:  Procedure Laterality Date   ABDOMINAL SURGERY     pt states during miscarriage got her intestine   BRAIN SURGERY     EYE MUSCLE SURGERY Right 03/28/2014   RIGHT HEART CATH N/A 08/05/2021   Procedure: RIGHT HEART CATH;  Surgeon: [name], MD;  Location: [place] [facility];  Service: Cardiovascular;  Laterality: N/A;   RIGHT/LEFT HEART CATH AND CORONARY ANGIOGRAPHY N/A 12/03/2020   Procedure: RIGHT/LEFT HEART CATH AND CORONARY ANGIOGRAPHY;  Surgeon: [name], MD;  Location: [place] [facility];  Service: Cardiovascular;  Laterality: N/A;   VENTRICULOSTOMY  03/28/1997    Family History: Family History  Problem Relation Age of Onset   Diabetes Mother    Hypertension Mother    Hyperlipidemia Mother    Thyroid disease Mother    Hypertension Father    Diabetes Father    Pancreatic cancer Paternal Aunt    Pancreatic cancer Paternal Uncle    Colon cancer Neg Hx    Esophageal cancer Neg Hx    Stomach cancer Neg Hx     Social History: Social History   Socioeconomic History   Marital status: Married    Spouse name: Not on file   Number of children: 0   Years of  education: Not on file   Highest education level: Not on file  Occupational History   Occupation: [employer]  Tobacco Use   Smoking status: Never   Smokeless tobacco: Never  Vaping Use   Vaping Use: Never used  Substance and Sexual Activity   Alcohol use: Never   Drug use: Never   Sexual activity: Yes  Other Topics Concern   Not on file  Social History Narrative   Not on file   Social Determinants of Health   Financial Resource Strain: Low Risk    Difficulty of Paying Living Expenses: Not very hard  Food Insecurity: No Food Insecurity   Worried About Charity fundraiser in the Last Year: Never true   [place] in the Last Year: Never true  Transportation Needs: No Transportation Needs   Lack of Transportation (Medical): No   Lack of Transportation (Non-Medical): No  Physical Activity: Not on file  Stress: Not on file  Social Connections: Not on file    Allergies:  Allergies  Allergen Reactions   Ketamine Other (See Comments)    In vegetative state for 15 minutes per pt   Clarithromycin Other (See Comments)    Stomach pain    Fentanyl Nausea And Vomiting   M[place] Mushroom [[place]] Itching    Itchy throat   Penicillin V Itching    Gi upset   Shellfish Allergy Swelling and Other (See Comments)    Throat  swelling. Pt has never had shellfish but assumes it will cause swelling. Pt states contrast in CT is okay   Morphine Itching and Rash   Penicillins Rash    Has patient had a PCN reaction causing immediate rash, facial/tongue/throat swelling, SOB or lightheadedness with hypotension: Y Has patient had a PCN reaction causing severe rash involving mucus membranes or skin necrosis: Y Has patient had a PCN reaction that required hospitalization: N Has patient had a PCN reaction occurring within the last 10 years: Y If all of the above answers are "NO", then may proceed with Cephalosporin use.    Prednisone Rash    Objective:    Vital Signs:   Temp:  [97.8  F (36.6 C)-98.2 F (36.8 C)] 97.8 F (36.6 C) (05/22 0748) Pulse Rate:  [99-108] 100 (05/22 0748) Resp:  [15-20] 16 (05/22 0748) BP: (121-137)/(81-98) 137/88 (05/22 0748) SpO2:  [96 %-98 %] 97 % (05/22 0748) Weight:  [65.1 kg] 65.1 kg (05/22 0358) Last BM Date : 08/14/21  Weight change: Filed Weights   08/14/21 0324 08/15/21 0430 08/16/21 0358  Weight: 65 kg 65 kg 65.1 kg    Intake/Output:   Intake/Output Summary (Last 24 hours) at 08/16/2021 0923 Last data filed at 08/16/2021 0400 Gross per 24 hour  Intake 2182.65 ml  Output --  Net 2182.65 ml      Physical Exam    General:  chronically ill appearing, looks older than actual age No resp difficulty HEENT: normal Neck: supple. Short thick neck, JVD not well visualized . Carotids 2+ bilat; no bruits. No lymphadenopathy or thyromegaly appreciated. Cor: PMI nondisplaced. Regular rhythm tachy rate. 2/6 TR murmur  Lungs: clear Abdomen: obese/ distended. NT. No hepatosplenomegaly. No bruits or masses. Good bowel sounds. Extremities: no cyanosis, clubbing, rash, edema Neuro: alert & orientedx3, cranial nerves grossly intact. moves all 4 extremities w/o difficulty. Affect pleasant   Telemetry   Sinus tach low 100s   EKG    No new EKG to review   Labs   Basic Metabolic Panel: Recent Labs  Lab 08/13/21 1941 08/14/21 0111 08/14/21 0417 08/14/21 1158 08/14/21 2039 08/15/21 0003 08/15/21 0323 08/15/21 0556 08/15/21 1047 08/15/21 1451 08/15/21 1902 08/16/21 0023 08/16/21 0354  NA 131*  --  130*  133*  --  129*  --  132*  --   --   --   --  137  --   K >7.5*   < > 4.8  4.9   < > 5.6*   < > 4.9   < > 5.5* 5.0 4.6 4.7 4.4  CL 98  --  96*  97*  --  97*  --  98  --   --   --   --  105  --   CO2 17*  --  19*  18*  --  18*  --  19*  --   --   --   --  20*  --   GLUCOSE 387*  --  264*  261*  --  461*  --  292*  --   --   --   --  288*  --   BUN 130*  --  131*  130*  --  129*  --  117*  --   --   --   --  83*  --    CREATININE 3.59*  --  3.44*  3.42*  --  2.92*  --  2.43*  --   --   --   --  2.02*  --   CALCIUM 9.1  --  9.6  9.8  --  8.5*  --  9.1  --   --   --   --  9.0  --    < > = values in this interval not displayed.    Liver Function Tests: Recent Labs  Lab 08/13/21 1941 08/14/21 0417  AST 78* 59*  ALT 103* 87*  ALKPHOS 72 79  BILITOT 0.6 0.6  PROT 8.3* 7.9  ALBUMIN 4.4 4.2   No results for input(s): LIPASE, AMYLASE in the last 168 hours. No results for input(s): AMMONIA in the last 168 hours.  CBC: Recent Labs  Lab 08/10/21 1445 08/14/21 0417 08/15/21 0323 08/16/21 0023  WBC 6.2 6.0 6.2 5.4  HGB 12.1 12.4 11.5* 11.2*  HCT 36.3 36.8 33.4* 32.2*  MCV 89.6 88.2 87.0 88.2  PLT 197 153 142* 142*    Cardiac Enzymes: No results for input(s): CKTOTAL, CKMB, CKMBINDEX, TROPONINI in the last 168 hours.  BNP: BNP (last 3 results) Recent Labs    05/13/21 1715 08/10/21 1443 08/13/21 1837  BNP 12.0 18.3 11.6    ProBNP (last 3 results) No results for input(s): PROBNP in the last 8760 hours.   CBG: Recent Labs  Lab 08/15/21 0638 08/15/21 1122 08/15/21 1700 08/15/21 2123 08/16/21 0606  GLUCAP 325* 292* 288* 333* 196*    Coagulation Studies: No results for input(s): LABPROT, INR in the last 72 hours.   Imaging   ECHOCARDIOGRAM COMPLETE  Result Date: 08/15/2021    ECHOCARDIOGRAM REPORT   Patient Name:   WANELL LORENZI Date of Exam: 08/15/2021 Medical Rec #:  [id number]         Height:       57.0 in Accession #:    [id number]        Weight:       143.3 lb Date of Birth:  November 20, 1990         BSA:          1.561 m Patient Age:    31 years          BP:           123/93 mmHg Patient Gender: F                 HR:           107 bpm. Exam Location:  Inpatient Procedure: 2D Echo, Cardiac Doppler and Color Doppler Indications:    CHF- Acute systolic  History:        Patient has prior history of Echocardiogram examinations, most                 recent 03/10/2021. CHF; Risk  Factors:Diabetes.  Sonographer:    Jefferey Pica Referring Phys: [id number] Margie Billet IMPRESSIONS  1. Hypokinesis of the infeirior, inferoseptal walls . Left ventricular ejection fraction, by estimation, is 45 to 50%. The left ventricle has mildly decreased function.  2. Right ventricular systolic function is normal. The right ventricular size is normal. There is normal pulmonary artery systolic pressure.  3. . The mitral valve is normal in structure. Mild to moderate mitral valve regurgitation.  4. The aortic valve is normal in structure. Aortic valve regurgitation is mild. Comparison(s): The left ventricular function is unchanged. FINDINGS  Left Ventricle: Hypokinesis of the infeirior, inferoseptal walls. Left ventricular ejection fraction, by estimation, is 45 to 50%. The left ventricle has mildly decreased function. The left ventricular internal cavity size was normal in size.  There is no left ventricular hypertrophy. Right Ventricle: The right ventricular size is normal. Right vetricular wall thickness was not assessed. Right ventricular systolic function is normal. There is normal pulmonary artery systolic pressure. The tricuspid regurgitant velocity is 2.50 m/s, and with an assumed right atrial pressure of 5 mmHg, the estimated right ventricular systolic pressure is 00.8 mmHg. Left Atrium: Left atrial size was normal in size. Right Atrium: Right atrial size was normal in size. Pericardium: There is no evidence of pericardial effusion. Mitral Valve: The mitral valve is normal in structure. Mild to moderate mitral valve regurgitation. Tricuspid Valve: The tricuspid valve is normal in structure. Tricuspid valve regurgitation is mild. Aortic Valve: The aortic valve is normal in structure. Aortic valve regurgitation is mild. Aortic regurgitation PHT measures 530 msec. Aortic valve peak gradient measures 7.4 mmHg. Pulmonic Valve: The pulmonic valve was normal in structure. Pulmonic valve regurgitation is mild.  Aorta: The aortic root and ascending aorta are structurally normal, with no evidence of dilitation. IAS/Shunts: No atrial level shunt detected by color flow Doppler.  LEFT VENTRICLE PLAX 2D LVIDd:         4.50 cm     Diastology LVIDs:         3.40 cm     LV e' lateral:   10.30 cm/s LV PW:         1.10 cm     LV E/e' lateral: 9.8 LV IVS:        1.10 cm LVOT diam:     1.90 cm LV SV:         58 LV SV Index:   37 LVOT Area:     2.84 cm  LV Volumes (MOD) LV vol d, MOD A4C: 92.8 ml LV vol s, MOD A4C: 56.3 ml LV SV MOD A4C:     92.8 ml RIGHT VENTRICLE RV Basal diam:  2.60 cm RV S prime:     11.30 cm/s TAPSE (M-mode): 1.7 cm LEFT ATRIUM             Index        RIGHT ATRIUM           Index LA diam:        3.10 cm 1.99 cm/m   RA Area:     10.10 cm LA Vol (A2C):   32.6 ml 20.88 ml/m  RA Volume:   21.60 ml  13.84 ml/m LA Vol (A4C):   30.0 ml 19.22 ml/m LA Biplane Vol: 31.7 ml 20.31 ml/m  AORTIC VALVE                 PULMONIC VALVE AV Area (Vmax): 2.50 cm     PV Vmax:       1.09 m/s AV Vmax:        136.00 cm/s  PV Peak grad:  4.8 mmHg AV Peak Grad:   7.4 mmHg LVOT Vmax:      120.00 cm/s LVOT Vmean:     76.300 cm/s LVOT VTI:       0.203 m AI PHT:         530 msec  AORTA Ao Root diam: 2.90 cm Ao Asc diam:  2.90 cm MITRAL VALVE                TRICUSPID VALVE MV Area (PHT): 6.54 cm     TR Peak grad:   25.0 mmHg MV Decel Time: 116 msec     TR Vmax:  250.00 cm/s MV E velocity: 101.00 cm/s MV A velocity: 83.70 cm/s   SHUNTS MV E/A ratio:  1.21         Systemic VTI:  0.20 m                             Systemic Diam: 1.90 cm Dorris Carnes MD Electronically signed by Dorris Carnes MD Signature Date/Time: 08/15/2021/4:12:12 PM    Final      Medications:     Current Medications:  aspirin EC  81 mg Oral Daily   carvedilol  6.25 mg Oral BID   DULoxetine  30 mg Oral QHS   fenofibrate  160 mg Oral Daily   heparin  5,000 Units Subcutaneous Q8H   insulin aspart  0-15 Units Subcutaneous TID WC   insulin aspart  0-5 Units  Subcutaneous QHS   insulin aspart  12 Units Subcutaneous TID WC   insulin glargine-yfgn  95 Units Subcutaneous BID   ivabradine  5 mg Oral BID WC   levothyroxine  125 mcg Oral Q0600   pantoprazole  40 mg Oral Daily   pregabalin  150 mg Oral QHS   sodium chloride flush  3 mL Intravenous Q12H    Infusions:  sodium chloride 100 mL/hr at 08/16/21 0040      Patient Profile   31 y.o. 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   Assessment/Plan   Hyperkalemia - admit K >7.5 - treated w/ Lokelma, insulin, and IV calcium gluconate - K 4.4 today  - Keep off spiro and Entresto    2. AKI  - Scr 3.6 on admit (BL ~1.2)  - improved w/ IVFs + diuretic hold - Scr down to 2.02 today   3. Chronic Systolic Heart Failure - likely NICM. - Echo (1/22): EF 35-40% Mod MR - Echo (11/28/20): EF 20-25% with moderate RV dysfunction and mild septal flattening. Mod-severe TR - Rheum serologies negative, but Rheumatoid factor mildly elevated at 15.5 - cMRI  9/22 EF 29%c/w prior myocarditis vs sarcoid. May have delayed chemo cardiotoxcity.  - R/LHC with single vessel RCA occlusion, RA 1, LVEDP 17, Fick CO 5.6/CI 3.88. - Echo (10/22): EF 45-50%, grade II DD, RV ok. - Echo 12/22: EF 45%, RV normal  - RHC 5/23 with elevated filling pressures and normal CO, RA 12 (with prominent v-waves), RV 47/14, PA 48/25 (37), PCW 25 - Echo this admit EF 45-50%, RV normal  - Holding diuretics/Jardiance w/ AKI. Stop IVFs today  - Hold spiro and Entresto w/ hyperkalemia  - Continue Coreg 6.25 mg bid - Continue Corlanor 5 mg bid  - May benefit from CardioMEMs placement to help w/ outpatient fluid monitoring    4. H/o Poor Med  Compliance - concern regarding pt's ability to mange meds correctly  - ? If AKI and hyperkalemia due to mismanagement of meds - she would benefit from paramedicine   5. CAD - Single vessel RCA occlusion (small co-dominant vessel) on cardiac cath 09/08. - Medical management.  - No s/s ischemia.  - Continue aspirin + statin.   6.  Pulmonary hypertension - Mild to moderate on RHC 5/23. PVR 3.7 WU. - Consider cautious trial of selective pulmonary vasodilators when better diuresed.  7. TR - Prominent v-waves in RA tracing on RHC 5/23 - Echo this admit shows only mild TR    8. Severe hyperTG - Due to LPL deficiency w/ recent pancreatitis. - Continue fenofibrate + niacin  - Previously offered referral to [facility] for further management. She prefers to continue with her endocrinologist at [facility] who currently manages/follows.   9. Type I DM - Follows w/ endocrinology at [facility]. - Off SGLT2i.   10. Abdominal swelling/bloating - Seen at [facility] (11/20) for IBS, per chart review. - Had penumatosis intestinalis. She was started on pancreatic enzyme replacement. - EGD (10/21): normal, bx taken to assess for celiac. - CT abdomen (2/22) and RUQ US showed no obvious gallbladder stones. - HIDA (4/22): ended early due to tachypnea, but cystic duct and gallbladder appeared WNL. - CT abdomen 12/22 unremarkable  - suspect primarily fatty liver   Length of Stay: 2  Lyda Jester, PA-C  08/16/2021, 8:52 AM  Advanced Heart Failure Team Pager [phone] (M-F; 7a - 5p)  Please contact [facility] for night-coverage after hours (4p -7a ) and weekends on [email or web address]  Patient seen and examined with the above-signed Advanced Practice Provider and/or Housestaff. I personally reviewed laboratory data, imaging studies and relevant notes. I independently examined the patient and formulated the important aspects of the plan. I have edited the note to reflect any of my changes or salient points. I have  personally discussed the plan with the patient and/or family.  31 y/o with systolic HF with recovered EF. Recent RHC for persistent ab swelling showed elevated filling pressures. Diuretics increased. Now admitted with volume depletion, AKI and hyperkalemia. Renal function improving with IVF. Denies SOB, orthopnea or PND.   General:  Well appearing. No resp difficulty HEENT: normal Neck: supple. no JVD. Carotids 2+ bilat; no bruits. No lymphadenopathy or thryomegaly appreciated. Cor: PMI nondisplaced. Regular rate & rhythm. No rubs, gallops or murmurs. Lungs: clear Abdomen: soft, nontender, nondistended. No hepatosplenomegaly. No bruits or masses. Good bowel sounds. Extremities: no cyanosis, clubbing, rash, edema Neuro: alert & orientedx3, cranial nerves grossly intact. Weak on left . Affect pleasant   Agree with above. Would continue to hold GDMT and diuretics. Stop IVF today. Will try to resume low-dose GDMT tomorrow labs and BP permitting. Follow daily BMETs. Cardiomems would be an option.   Glori Bickers, MD  12:13 PM

## 2021-08-16 NOTE — Progress Notes (Signed)
PROGRESS NOTE    Grasiela Jonsson  SFK:[id number] DOB: 1990/09/01 DOA: 08/13/2021 PCP: Sue Lush, PA-C   Brief Narrative:  Jennifer [name] is a pleasant 31 y.o. female with medical history significant for astrocytoma status postresection and chemotherapy, hypertriglyceridemia, chronic pancreatitis, thyroidism, CKD 3A, insulin-dependent diabetes mellitus, and nonischemic cardiomyopathy with EF 45%, presented to the emergency department with general weakness and fatigue.  Patient recently increased her torsemide, metolazone, and potassium supplement, had blood work concerning for acute renal failure and hyperkalemia, and began holding her diuretics and potassium day before admission.    Upon arrival to the ED, she was hemodynamically stable with potassium >7.5, bicarbonate 17, BUN 130, creatinine 3.59, and mild elevation in transaminases.  Patient was treated with Lokelma, insulin, and IV calcium gluconate in the ED. admitted under hospital service, diuretics on hold.  Cardiology on board.    Assessment & Plan:   Principal Problem:   Hyperkalemia Active Problems:   Acute renal failure superimposed on stage 3a chronic kidney disease (HCC)   Nonischemic cardiomyopathy (HCC)   Elevated transaminase level   Hx of insulin dependent diabetes mellitus  Hyperkalemia: Now resolved and has remained stable since yesterday.  We will discontinue every 4 hour potassium check and will check daily from here.  AKI on CKD stage IIIa: Baseline creatinine as a few weeks ago around 1.3-1.5.  Pt with CKD IIIa recently increased diuretics and potassium supplement and found to have acute renal failure with BUN 130, SCr 3.59, serum bicarb 17.  Slight improvement in creatinine further today and creatinine down to 2.02.  Continue IV fluids but now will reduce the rate to 75 cc/h to prevent CHF exacerbation.  Continue to hold all nephrotoxic agents which include Entresto, torsemide, metolazone,  Aldactone.  Chronic systolic CHF: Appears to be euvolemic.  Holding all diuretics and Entresto, cardiology consulted, they have resumed her Corlanor and she remains on Coreg.  Defer to cardiology.  Insulin-dependent type 2 diabetes mellitus: Hemoglobin A1c 7.9.  Followed by [facility] endocrinology.  Appears to be on very high dose of concentrated Humulin to 60 units 3 times daily with each meal and 100 units at bedtime.  Currently she is on Semglee 95 units, NovoLog 12 units 3 times daily Premeal and SSI, blood sugar much better than yesterday but still elevated, per diabetes coordinator recommendation, will increase Semglee to 100 units twice daily, increase Premeal regime to 20 units and continue current SSI.    Elevated transaminases  - AST is 78 and ALT 103 on admission with normal alk phos and bili, benign abdominal exam  - She has hx of steatosis attributed to markedly elevated triglycerides.  Viral hepatitis panel is negative.  History of peripheral neuropathy.  Lyrica continued.  GERD: Continue PPI.  DVT prophylaxis: heparin injection 5,000 Units Start: 08/14/21 0600   Code Status: Full Code  Family Communication:  None present at bedside.  Plan of care discussed with patient in length and he/she verbalized understanding and agreed with it.  Status is: Inpatient Remains inpatient appropriate because: Needs IV fluids and further monitoring and adjustment of heart failure medications.   Estimated body mass index is 31.06 kg/m as calculated from the following:   Height as of this encounter: 4' 9"  (1.448 m).   Weight as of this encounter: 65.1 kg.    Nutritional Assessment: Body mass index is 31.06 kg/m.Marland Kitchen Seen by dietician.  I agree with the assessment and plan as outlined below: Nutrition Status:        .  Skin Assessment: I have examined the patient's skin and I agree with the wound assessment as performed by the wound care RN as outlined below:    Consultants:   None  Procedures:  None  Antimicrobials:  Anti-infectives (From admission, onward)    None         Subjective:  Patient seen and examined.  No complaints.  Objective: Vitals:   08/15/21 2356 08/16/21 0358 08/16/21 0748 08/16/21 1139  BP: (!) 134/98 121/84 137/88 (!) 132/93  Pulse: 100 99 100 94  Resp: 20 16 16 20   Temp: 98.2 F (36.8 C) 97.9 F (36.6 C) 97.8 F (36.6 C) 98.1 F (36.7 C)  TempSrc: Oral Oral Oral Oral  SpO2: 98% 96% 97% 98%  Weight:  65.1 kg    Height:        Intake/Output Summary (Last 24 hours) at 08/16/2021 1146 Last data filed at 08/16/2021 0800 Gross per 24 hour  Intake 2542.65 ml  Output --  Net 2542.65 ml    Filed Weights   08/14/21 0324 08/15/21 0430 08/16/21 0358  Weight: 65 kg 65 kg 65.1 kg    Examination:  General exam: Appears calm and comfortable  Respiratory system: Clear to auscultation. Respiratory effort normal. Cardiovascular system: S1 & S2 heard, RRR. No JVD, murmurs, rubs, gallops or clicks. No pedal edema. Gastrointestinal system: Abdomen is nondistended, soft and nontender. No organomegaly or masses felt. Normal bowel sounds heard. Central nervous system: Alert and oriented. No focal neurological deficits. Extremities: Symmetric 5 x 5 power. Skin: No rashes, lesions or ulcers.  Psychiatry: Judgement and insight appear normal. Mood & affect appropriate.   Data Reviewed: I have personally reviewed following labs and imaging studies  CBC: Recent Labs  Lab 08/10/21 1445 08/14/21 0417 08/15/21 0323 08/16/21 0023  WBC 6.2 6.0 6.2 5.4  HGB 12.1 12.4 11.5* 11.2*  HCT 36.3 36.8 33.4* 32.2*  MCV 89.6 88.2 87.0 88.2  PLT 197 153 142* 142*    Basic Metabolic Panel: Recent Labs  Lab 08/13/21 1941 08/14/21 0111 08/14/21 0417 08/14/21 1158 08/14/21 2039 08/15/21 0003 08/15/21 0323 08/15/21 0556 08/15/21 1047 08/15/21 1451 08/15/21 1902 08/16/21 0023 08/16/21 0354  NA 131*  --  130*  133*  --  129*  --   132*  --   --   --   --  137  --   K >7.5*   < > 4.8  4.9   < > 5.6*   < > 4.9   < > 5.5* 5.0 4.6 4.7 4.4  CL 98  --  96*  97*  --  97*  --  98  --   --   --   --  105  --   CO2 17*  --  19*  18*  --  18*  --  19*  --   --   --   --  20*  --   GLUCOSE 387*  --  264*  261*  --  461*  --  292*  --   --   --   --  288*  --   BUN 130*  --  131*  130*  --  129*  --  117*  --   --   --   --  83*  --   CREATININE 3.59*  --  3.44*  3.42*  --  2.92*  --  2.43*  --   --   --   --  2.02*  --  CALCIUM 9.1  --  9.6  9.8  --  8.5*  --  9.1  --   --   --   --  9.0  --    < > = values in this interval not displayed.    GFR: Estimated Creatinine Clearance: 31.6 mL/min (A) (by C-G formula based on SCr of 2.02 mg/dL (H)). Liver Function Tests: Recent Labs  Lab 08/13/21 1941 08/14/21 0417  AST 78* 59*  ALT 103* 87*  ALKPHOS 72 79  BILITOT 0.6 0.6  PROT 8.3* 7.9  ALBUMIN 4.4 4.2    No results for input(s): LIPASE, AMYLASE in the last 168 hours. No results for input(s): AMMONIA in the last 168 hours. Coagulation Profile: No results for input(s): INR, PROTIME in the last 168 hours. Cardiac Enzymes: No results for input(s): CKTOTAL, CKMB, CKMBINDEX, TROPONINI in the last 168 hours. BNP (last 3 results) No results for input(s): PROBNP in the last 8760 hours. HbA1C: Recent Labs    08/15/21 0556  HGBA1C 7.9*    CBG: Recent Labs  Lab 08/15/21 0638 08/15/21 1122 08/15/21 1700 08/15/21 2123 08/16/21 0606  GLUCAP 325* 292* 288* 333* 196*    Lipid Profile: No results for input(s): CHOL, HDL, LDLCALC, TRIG, CHOLHDL, LDLDIRECT in the last 72 hours. Thyroid Function Tests: No results for input(s): TSH, T4TOTAL, FREET4, T3FREE, THYROIDAB in the last 72 hours. Anemia Panel: No results for input(s): VITAMINB12, FOLATE, FERRITIN, TIBC, IRON, RETICCTPCT in the last 72 hours. Sepsis Labs: No results for input(s): PROCALCITON, LATICACIDVEN in the last 168 hours.  No results found for this  or any previous visit (from the past 240 hour(s)).   Radiology Studies: US RENAL  Result Date: 08/14/2021 CLINICAL DATA:  Acute kidney injury. EXAM: RENAL / URINARY TRACT ULTRASOUND COMPLETE COMPARISON:  Renal ultrasound 05/13/2021. FINDINGS: Right Kidney: Renal measurements: 10.1 x 5.0 x 4.6 cm = volume: 14.1 mL. Echogenicity within normal limits. No mass or hydronephrosis visualized. Left Kidney: Renal measurements: 11.5 x 4.8 x 4.5 cm = volume: 132 mL. Echogenicity within normal limits. No mass or hydronephrosis visualized. Bladder: Appears normal for degree of bladder distention. Other: None. IMPRESSION: Normal renal ultrasound Electronically Signed   By: Ronney Asters M.D.   On: 08/14/2021 15:09   ECHOCARDIOGRAM COMPLETE  Result Date: 08/15/2021    ECHOCARDIOGRAM REPORT   Patient Name:   KARIAH LOREDO Date of Exam: 08/15/2021 Medical Rec #:  [id number]         Height:       57.0 in Accession #:    [id number]        Weight:       143.3 lb Date of Birth:  1990-05-30         BSA:          1.561 m Patient Age:    31 years          BP:           123/93 mmHg Patient Gender: F                 HR:           107 bpm. Exam Location:  Inpatient Procedure: 2D Echo, Cardiac Doppler and Color Doppler Indications:    CHF- Acute systolic  History:        Patient has prior history of Echocardiogram examinations, most                 recent 03/10/2021. CHF; Risk Factors:Diabetes.  Sonographer:  Jefferey Pica Referring Phys: [id number] Margie Billet IMPRESSIONS  1. Hypokinesis of the infeirior, inferoseptal walls . Left ventricular ejection fraction, by estimation, is 45 to 50%. The left ventricle has mildly decreased function.  2. Right ventricular systolic function is normal. The right ventricular size is normal. There is normal pulmonary artery systolic pressure.  3. . The mitral valve is normal in structure. Mild to moderate mitral valve regurgitation.  4. The aortic valve is normal in structure. Aortic valve  regurgitation is mild. Comparison(s): The left ventricular function is unchanged. FINDINGS  Left Ventricle: Hypokinesis of the infeirior, inferoseptal walls. Left ventricular ejection fraction, by estimation, is 45 to 50%. The left ventricle has mildly decreased function. The left ventricular internal cavity size was normal in size. There is no left ventricular hypertrophy. Right Ventricle: The right ventricular size is normal. Right vetricular wall thickness was not assessed. Right ventricular systolic function is normal. There is normal pulmonary artery systolic pressure. The tricuspid regurgitant velocity is 2.50 m/s, and with an assumed right atrial pressure of 5 mmHg, the estimated right ventricular systolic pressure is 33.0 mmHg. Left Atrium: Left atrial size was normal in size. Right Atrium: Right atrial size was normal in size. Pericardium: There is no evidence of pericardial effusion. Mitral Valve: The mitral valve is normal in structure. Mild to moderate mitral valve regurgitation. Tricuspid Valve: The tricuspid valve is normal in structure. Tricuspid valve regurgitation is mild. Aortic Valve: The aortic valve is normal in structure. Aortic valve regurgitation is mild. Aortic regurgitation PHT measures 530 msec. Aortic valve peak gradient measures 7.4 mmHg. Pulmonic Valve: The pulmonic valve was normal in structure. Pulmonic valve regurgitation is mild. Aorta: The aortic root and ascending aorta are structurally normal, with no evidence of dilitation. IAS/Shunts: No atrial level shunt detected by color flow Doppler.  LEFT VENTRICLE PLAX 2D LVIDd:         4.50 cm     Diastology LVIDs:         3.40 cm     LV e' lateral:   10.30 cm/s LV PW:         1.10 cm     LV E/e' lateral: 9.8 LV IVS:        1.10 cm LVOT diam:     1.90 cm LV SV:         58 LV SV Index:   37 LVOT Area:     2.84 cm  LV Volumes (MOD) LV vol d, MOD A4C: 92.8 ml LV vol s, MOD A4C: 56.3 ml LV SV MOD A4C:     92.8 ml RIGHT VENTRICLE RV Basal  diam:  2.60 cm RV S prime:     11.30 cm/s TAPSE (M-mode): 1.7 cm LEFT ATRIUM             Index        RIGHT ATRIUM           Index LA diam:        3.10 cm 1.99 cm/m   RA Area:     10.10 cm LA Vol (A2C):   32.6 ml 20.88 ml/m  RA Volume:   21.60 ml  13.84 ml/m LA Vol (A4C):   30.0 ml 19.22 ml/m LA Biplane Vol: 31.7 ml 20.31 ml/m  AORTIC VALVE                 PULMONIC VALVE AV Area (Vmax): 2.50 cm     PV Vmax:       1.09  m/s AV Vmax:        136.00 cm/s  PV Peak grad:  4.8 mmHg AV Peak Grad:   7.4 mmHg LVOT Vmax:      120.00 cm/s LVOT Vmean:     76.300 cm/s LVOT VTI:       0.203 m AI PHT:         530 msec  AORTA Ao Root diam: 2.90 cm Ao Asc diam:  2.90 cm MITRAL VALVE                TRICUSPID VALVE MV Area (PHT): 6.54 cm     TR Peak grad:   25.0 mmHg MV Decel Time: 116 msec     TR Vmax:        250.00 cm/s MV E velocity: 101.00 cm/s MV A velocity: 83.70 cm/s   SHUNTS MV E/A ratio:  1.21         Systemic VTI:  0.20 m                             Systemic Diam: 1.90 cm Dorris Carnes MD Electronically signed by Dorris Carnes MD Signature Date/Time: 08/15/2021/4:12:12 PM    Final     Scheduled Meds:  aspirin EC  81 mg Oral Daily   carvedilol  6.25 mg Oral BID   DULoxetine  30 mg Oral QHS   fenofibrate  160 mg Oral Daily   heparin  5,000 Units Subcutaneous Q8H   insulin aspart  0-15 Units Subcutaneous TID WC   insulin aspart  0-5 Units Subcutaneous QHS   insulin aspart  20 Units Subcutaneous TID WC   insulin glargine-yfgn  100 Units Subcutaneous BID   ivabradine  5 mg Oral BID WC   levothyroxine  125 mcg Oral Q0600   pantoprazole  40 mg Oral Daily   pregabalin  150 mg Oral QHS   sodium chloride flush  3 mL Intravenous Q12H   Continuous Infusions:  sodium chloride 100 mL/hr at 08/16/21 0040     LOS: 2 days   Darliss Cheney, MD [facility]  08/16/2021, 11:46 AM   *Please note that this is a verbal dictation therefore any spelling or grammatical errors are due to the "[place]" system  interpretation.  Please page via [place] and do not message via secure chat for urgent patient care matters. Secure chat can be used for non urgent patient care matters.  How to contact the [facility] Attending or Consulting provider [place] or covering provider during after hours [place], for this patient?  Check the care team in [facility] and look for a) attending/consulting [facility] provider listed and b) the [facility] team listed. Page or secure chat [phone]. Log into [email or web address] and use [place]'s universal password to access. If you do not have the password, please contact the hospital operator. Locate the [facility] provider you are looking for under [facility] and page to a number that you can be directly reached. If you still have difficulty reaching the provider, please page the [facility] (Director on Call) for the Hospitalists listed on amion for assistance.

## 2021-08-17 DIAGNOSIS — I5022 Chronic systolic (congestive) heart failure: Secondary | ICD-10-CM | POA: Diagnosis not present

## 2021-08-17 DIAGNOSIS — E875 Hyperkalemia: Secondary | ICD-10-CM | POA: Diagnosis not present

## 2021-08-17 LAB — BASIC METABOLIC PANEL
Anion gap: 9 (ref 5–15)
BUN: 50 mg/dL — ABNORMAL HIGH (ref 6–20)
CO2: 21 mmol/L — ABNORMAL LOW (ref 22–32)
Calcium: 9.4 mg/dL (ref 8.9–10.3)
Chloride: 105 mmol/L (ref 98–111)
Creatinine, Ser: 1.35 mg/dL — ABNORMAL HIGH (ref 0.44–1.00)
GFR, Estimated: 54 mL/min — ABNORMAL LOW (ref >60)
Glucose, Bld: 93 mg/dL (ref 70–99)
Potassium: 4.1 mmol/L (ref 3.5–5.1)
Sodium: 135 mmol/L (ref 135–145)

## 2021-08-17 LAB — CBC
HCT: 33.8 % — ABNORMAL LOW (ref 36.0–46.0)
Hemoglobin: 11.2 g/dL — ABNORMAL LOW (ref 12.0–15.0)
MCH: 29.3 pg (ref 26.0–34.0)
MCHC: 33.1 g/dL (ref 30.0–36.0)
MCV: 88.5 fL (ref 80.0–100.0)
Platelets: 136 10*3/uL — ABNORMAL LOW (ref 150–400)
RBC: 3.82 MIL/uL — ABNORMAL LOW (ref 3.87–5.11)
RDW: 15.1 % (ref 11.5–15.5)
WBC: 6.3 10*3/uL (ref 4.0–10.5)
nRBC: 0 % (ref 0.0–0.2)

## 2021-08-17 LAB — GLUCOSE, CAPILLARY
Glucose-Capillary: 85 mg/dL (ref 70–99)
Glucose-Capillary: 138 mg/dL — ABNORMAL HIGH (ref 70–99)
Glucose-Capillary: 130 mg/dL — ABNORMAL HIGH (ref 70–99)

## 2021-08-17 MED ORDER — POTASSIUM CHLORIDE CRYS ER 20 MEQ PO TBCR: 40.0000 meq | EXTENDED_RELEASE_TABLET | ORAL | 0 refills | Status: DC

## 2021-08-17 MED ORDER — METOLAZONE 2.5 MG PO TABS: 2.5000 mg | ORAL_TABLET | ORAL | 0 refills | Status: DC

## 2021-08-17 MED ORDER — METOLAZONE 2.5 MG PO TABS
2.5000 mg | ORAL_TABLET | ORAL | 0 refills | Status: DC
Start: 2021-08-17 — End: 2021-08-22

## 2021-08-17 NOTE — TOC Initial Note (Addendum)
Transition of Care [facility]) - Initial/Assessment Note    Patient Details  Name: Jennifer [name] MRN: [id number] Date of Birth: 11-Jan-1991  Transition of Care [facility] CM/SW Contact:    Erenest Rasher, RN Phone Number: [phone] 08/17/2021, 11:19 AM  Clinical Narrative:                 HF TOC CM spoke to pt and states husband will provide transportation. Pt states she does have a scale and tries to adhere to a heart healthy diet. Was independent prior to hospital stay. She plans to try to return to work. Medications reviewed. Request to attending to send all meds to pt pharmacy.   Expected Discharge Plan: Home/Self Care Barriers to Discharge: No Barriers Identified   Patient Goals and CMS Choice Patient states their goals for this hospitalization and ongoing recovery are:: would like to stay well and work      Expected Discharge Plan and Services Expected Discharge Plan: Home/Self Care   Discharge Planning Services: CM Consult     Expected Discharge Date: 08/17/21                                    Prior Living Arrangements/Services   Lives with:: Spouse Patient language and need for interpreter reviewed:: Yes Do you feel safe going back to the place where you live?: Yes      Need for Family Participation in Patient Care: No (Comment) Care giver support system in place?: No (comment) Current home services: DME (scale) Criminal Activity/Legal Involvement Pertinent to Current Situation/Hospitalization: Yes - Comment as needed  Activities of Daily Living Home Assistive Devices/Equipment: CBG Meter ADL Screening (condition at time of admission) Patient's cognitive ability adequate to safely complete daily activities?: Yes Is the patient deaf or have difficulty hearing?: No Does the patient have difficulty seeing, even when wearing glasses/contacts?: No Does the patient have difficulty concentrating, remembering, or making decisions?: No Patient able to express  need for assistance with ADLs?: Yes Does the patient have difficulty dressing or bathing?: No Independently performs ADLs?: Yes (appropriate for developmental age) Does the patient have difficulty walking or climbing stairs?: No Weakness of Legs: None Weakness of Arms/Hands: None  Permission Sought/Granted Permission sought to share information with : Case Manager, Family Supports, PCP Permission granted to share information with : Yes, Verbal Permission Granted  Share Information with NAME: Jennifer [name]     Permission granted to share info w Relationship: husband  Permission granted to share info w Contact Information: [phone]  Emotional Assessment Appearance:: Appears stated age Attitude/Demeanor/Rapport: Engaged Affect (typically observed): Accepting Orientation: : Oriented to Self, Oriented to Place, Oriented to  Time, Oriented to Situation   Psych Involvement: No (comment)  Admission diagnosis:  Hyperkalemia [E87.5] AKI (acute kidney injury) ([place]) [N17.9] Patient Active Problem List   Diagnosis Date Noted   Hyperkalemia 08/14/2021   Hx of insulin dependent diabetes mellitus 08/14/2021   Acute kidney failure ([place]) 05/13/2021   Chest pain 03/10/2021   Acute on chronic combined systolic and diastolic CHF (congestive heart failure) ([place]) 02/09/2021   History of astrocytoma of brain 02/09/2021   Severe hyperglycemia due to diabetes mellitus ([place]) 01/25/2021   Diarrhea    Elevated transaminase level    Acute combined systolic and diastolic heart failure (HCC)    Nonischemic cardiomyopathy (HCC)    Acute decompensated heart failure (HCC)    Elevated liver enzymes  Acute renal failure superimposed on stage 3a chronic kidney disease ([place]) 11/26/2020   Abdominal pain 92/15/1582   Chronic systolic CHF (congestive heart failure) ([place]) 11/23/2020   Essential hypertension 11/23/2020   Hypertriglyceridemia 11/23/2020   DM (diabetes mellitus) ([place]) 10/10/2018    Hypothyroidism 10/10/2018   PCP:  Sue Lush, PA-C Pharmacy:   [facility] [id number] - [address], [place] [address] Phone: [phone] Fax: [phone]     Social Determinants of Health (SDOH) Interventions    Readmission Risk Interventions     View : No data to display.

## 2021-08-17 NOTE — Discharge Summary (Addendum)
PatientPhysician Discharge Summary  Jennifer [name] TTS:[id number] DOB: 1991/03/20 DOA: 08/13/2021  PCP: Sue Lush, PA-C  Admit date: 08/13/2021 Discharge date: 08/17/2021 30 Day Unplanned Readmission Risk Score    Flowsheet Row ED to Hosp-Admission (Current) from 08/13/2021 in [facility] 4E CV SURGICAL PROGRESSIVE CARE  30 Day Unplanned Readmission Risk Score (%) 38.34 Filed at 08/17/2021 0801       This score is the patient's risk of an unplanned readmission within 30 days of being discharged (0 -100%). The score is based on dignosis, age, lab data, medications, orders, and past utilization.   Low:  0-14.9   Medium: 15-21.9   High: 22-29.9   Extreme: 30 and above          Admitted From: Home Disposition: Home  Recommendations for Outpatient Follow-up:  Follow up with PCP in 1-2 weeks Please obtain BMP/CBC in one week Follow-up with heart failure team in 1 week Please follow up with your PCP on the following pending results: Unresulted Labs (From admission, onward)     Start     Ordered   08/15/21 0923  Basic metabolic panel  Daily,   R      08/14/21 [place]: None Equipment/Devices: None  Discharge Condition: Stable CODE STATUS: Full code Diet recommendation: Cardiac/low-sodium  Subjective: Seen and examined.  Feels well.  No complaints.  Cardiology told her that she could go home.  Excited to go home today.  Brief/Interim Summary: Jennifer [name] is a pleasant 31 y.o. female with medical history significant for astrocytoma status postresection and chemotherapy, hypertriglyceridemia, chronic pancreatitis, thyroidism, CKD 3A, insulin-dependent diabetes mellitus, and nonischemic cardiomyopathy with EF 45%, presented to the emergency department with general weakness and fatigue.  Patient recently increased her torsemide, metolazone, and potassium supplement, had blood work concerning for acute renal failure and hyperkalemia, and began holding her  diuretics and potassium day before admission.     Upon arrival to the ED, she was hemodynamically stable with potassium >7.5, bicarbonate 17, BUN 130, creatinine 3.59, and mild elevation in transaminases.  Patient was treated with Lokelma, insulin, and IV calcium gluconate in the ED. admitted under hospital service, diuretics on hold.  Cardiology on board.  Detailed hospitalization below.   Hyperkalemia: Now resolved and has remained stable since past 2 days..    AKI on CKD stage IIIa: Baseline creatinine as a few weeks ago around 1.3-1.5.  found to have acute renal failure with BUN 130, SCr 3.59, serum bicarb 17.  Diuretics were held, creatinine has improved and is 1.85 today.  Cleared for discharge from cardiology.   Chronic systolic CHF: Appears to be euvolemic.  Initially we held all her diuretics but now cardiology has recommended resuming all her diuretics back with metolazone 2.5 mg weekly with potassium chloride on the days that she takes.   Insulin-dependent type 2 diabetes mellitus: Hemoglobin A1c 7.9.  Followed by [facility] endocrinology.  Resume home medications.   Discharge plan was discussed with patient and/or family member and they verbalized understanding and agreed with it.  Discharge Diagnoses:  Principal Problem:   Hyperkalemia Active Problems:   Acute renal failure superimposed on stage 3a chronic kidney disease (HCC)   Nonischemic cardiomyopathy (HCC)   Elevated transaminase level   Hx of insulin dependent diabetes mellitus    Discharge Instructions   Allergies as of 08/17/2021       Reactions   Ketamine Other (See Comments)  In vegetative state for 15 minutes per pt   Clarithromycin Other (See Comments)   Stomach pain   Fentanyl Nausea And Vomiting   Maitake Mushroom [maitake] Itching   Itchy throat   Penicillin V Itching   Gi upset   Shellfish Allergy Swelling, Other (See Comments)   Throat swelling. Pt has never had shellfish but assumes it will cause  swelling. Pt states contrast in CT is okay   Morphine Itching, Rash   Penicillins Rash   Has patient had a PCN reaction causing immediate rash, facial/tongue/throat swelling, SOB or lightheadedness with hypotension: Y Has patient had a PCN reaction causing severe rash involving mucus membranes or skin necrosis: Y Has patient had a PCN reaction that required hospitalization: N Has patient had a PCN reaction occurring within the last 10 years: Y If all of the above answers are "NO", then may proceed with Cephalosporin use.   Prednisone Rash        Medication List     STOP taking these medications    Aldactone 25 MG tablet Generic drug: spironolactone   Lokelma 10 g Pack packet Generic drug: sodium zirconium cyclosilicate       TAKE these medications    albuterol 108 (90 Base) MCG/ACT inhaler Commonly known as: VENTOLIN HFA Inhale 1 puff into the lungs every 6 (six) hours as needed for wheezing or shortness of breath.   aspirin EC 81 MG tablet Take 1 tablet (81 mg total) by mouth daily. Swallow whole.   carvedilol 6.25 MG tablet Commonly known as: COREG Take 1 tablet (6.25 mg total) by mouth 2 (two) times daily.   cetirizine 10 MG tablet Commonly known as: ZYRTEC Take 10 mg by mouth daily.   doxycycline 100 MG tablet Commonly known as: VIBRA-TABS Take 100 mg by mouth 2 (two) times daily.   DULoxetine 30 MG capsule Commonly known as: CYMBALTA Take 30 mg by mouth at bedtime.   Entresto 49-51 MG Generic drug: sacubitril-valsartan Take 1 tablet by mouth every 12 (twelve) hours.   erythromycin ophthalmic ointment Place one application. into the right eye at bedtime.   fenofibrate 160 MG tablet Take 1 tablet (160 mg total) by mouth daily.   fluticasone 50 MCG/ACT nasal spray Commonly known as: FLONASE Place 1 spray into both nostrils daily.   insulin regular human CONCENTRATED 500 UNIT/ML injection Commonly known as: HUMULIN R Inject 260 Units into the skin  See admin instructions. 260 units 3 times daily with each meal, 100 units at bedtime   ivabradine 7.5 MG Tabs tablet Commonly known as: CORLANOR Take 1 tablet (7.5 mg total) by mouth 2 (two) times daily with a meal.   Jardiance 10 MG Tabs tablet Generic drug: empagliflozin Take 10 mg by mouth daily.   Jencycla 0.35 MG tablet Generic drug: norethindrone Take 1 tablet by mouth daily.   levothyroxine 125 MCG tablet Commonly known as: SYNTHROID Take 1 tablet (125 mcg total) by mouth daily at 6 (six) AM.   lipase/protease/amylase 36000 UNITS Cpep capsule Commonly known as: CREON Take 36,000-72,000 Units by mouth See admin instructions. 72,000 units with each meal, 36,000 units with snacks   metolazone 2.5 MG tablet Commonly known as: ZAROXOLYN Take 1 tablet (2.5 mg total) by mouth once a week for 10 doses. What changed:  how much to take when to take this   metroNIDAZOLE 250 MG tablet Commonly known as: FLAGYL Take 250 mg by mouth 3 (three) times daily.   multivitamin with minerals Tabs tablet  Take 1 tablet by mouth daily.   nitroGLYCERIN 0.4 MG SL tablet Commonly known as: NITROSTAT Place 1 tablet (0.4 mg total) under the tongue every 5 (five) minutes as needed for up to 25 days for chest pain.   ofloxacin 0.3 % ophthalmic solution Commonly known as: OCUFLOX Place 1 drop into both eyes in the morning, at noon, in the evening, and at bedtime.   ondansetron 4 MG tablet Commonly known as: ZOFRAN Take 4 mg by mouth every 8 (eight) hours as needed for nausea or vomiting.   pantoprazole 40 MG tablet Commonly known as: PROTONIX Take 40 mg by mouth daily.   potassium chloride SA 20 MEQ tablet Commonly known as: KLOR-CON M Take 2 tablets (40 mEq total) by mouth once a week for 10 doses.   pregabalin 300 MG capsule Commonly known as: LYRICA Take 300 mg by mouth at bedtime.   pregabalin 100 MG capsule Commonly known as: LYRICA Take 1-2 capsules (100-200 mg total) by  mouth See admin instructions. 200 mg in the morning, 100 mg in the afternnon   prochlorperazine 10 MG tablet Commonly known as: COMPAZINE Take 1 tablet (10 mg total) by mouth every 6 (six) hours as needed for nausea or vomiting.   promethazine 25 MG tablet Commonly known as: PHENERGAN Take 25 mg by mouth every 6 (six) hours as needed for vomiting or nausea.   Repatha 140 MG/ML Sosy Generic drug: Evolocumab Inject 140 mg into the skin See admin instructions. Inject 140 mg subcutaneously every other Sunday evening   rosuvastatin 40 MG tablet Commonly known as: CRESTOR Take 40 mg by mouth daily.   torsemide 20 MG tablet Commonly known as: DEMADEX Take 2 tablets (40 mg total) by mouth 2 (two) times daily.   tretinoin 0.05 % cream Commonly known as: RETIN-A Apply 1 application topically at bedtime.        Follow-up Information     Sue Lush, PA-C Follow up in 1 week(s).   Specialty: Physician Assistant Contact information: [place] [id number] [phone]         Jacelyn Pi, MD .   Specialty: Endocrinology Contact information: [place] [address] [phone]                Allergies  Allergen Reactions   Ketamine Other (See Comments)    In vegetative state for 15 minutes per pt   Clarithromycin Other (See Comments)    Stomach pain    Fentanyl Nausea And Vomiting   Maitake Mushroom [Maitake] Itching    Itchy throat   Penicillin V Itching    Gi upset   Shellfish Allergy Swelling and Other (See Comments)    Throat swelling. Pt has never had shellfish but assumes it will cause swelling. Pt states contrast in CT is okay   Morphine Itching and Rash   Penicillins Rash    Has patient had a PCN reaction causing immediate rash, facial/tongue/throat swelling, SOB or lightheadedness with hypotension: Y Has patient had a PCN reaction causing severe rash involving mucus membranes or skin  necrosis: Y Has patient had a PCN reaction that required hospitalization: N Has patient had a PCN reaction occurring within the last 10 years: Y If all of the above answers are "NO", then may proceed with Cephalosporin use.    Prednisone Rash    Consultations: Cardiology   Procedures/Studies: DG Chest 2 View  Result Date: 08/13/2021 CLINICAL DATA:  Shortness  of breath, CHF EXAM: CHEST - 2 VIEW COMPARISON:  05/13/2021 FINDINGS: Cardiac and mediastinal contours are within normal limits given AP technique. No focal pulmonary opacity. No pleural effusion or pneumothorax. No acute osseous abnormality. IMPRESSION: No acute cardiopulmonary process. Electronically Signed   By: Merilyn Baba M.D.   On: 08/13/2021 19:25   CARDIAC CATHETERIZATION  Result Date: 08/05/2021 Findings: RA = 12 prominent V waves RV = 47/14 PA = 48/25 (37) PCW = 25 Fick cardiac output/index = 4.4/2.8 PVR =3.7 WU Ao sat = 99% PA sat = 62%, 62% Assessment: 1. Elevated filling pressures with normal cardiac output 2. Mild to moderate mixed pulmonary HTN with prominent v-waves in RA suggestive of significant TR Plan/Discussion: Diurese.  Wil give dose of IV lasix prior to d/c. Add metolazone 2.5 + Kcl 40 once a week. Recheck echo to look at TV. Can consider cautious trial of selective pulmonary vasodilators once fluid status improved. Glori Bickers, MD 10:17 AM  US RENAL  Result Date: 08/14/2021 CLINICAL DATA:  Acute kidney injury. EXAM: RENAL / URINARY TRACT ULTRASOUND COMPLETE COMPARISON:  Renal ultrasound 05/13/2021. FINDINGS: Right Kidney: Renal measurements: 10.1 x 5.0 x 4.6 cm = volume: 14.1 mL. Echogenicity within normal limits. No mass or hydronephrosis visualized. Left Kidney: Renal measurements: 11.5 x 4.8 x 4.5 cm = volume: 132 mL. Echogenicity within normal limits. No mass or hydronephrosis visualized. Bladder: Appears normal for degree of bladder distention. Other: None. IMPRESSION: Normal renal ultrasound  Electronically Signed   By: Ronney Asters M.D.   On: 08/14/2021 15:09   ECHOCARDIOGRAM COMPLETE  Result Date: 08/15/2021    ECHOCARDIOGRAM REPORT   Patient Name:   Jennifer [name] Date of Exam: 08/15/2021 Medical Rec #:  [id number]         Height:       57.0 in Accession #:    [id number]        Weight:       143.3 lb Date of Birth:  January 26, 1991         BSA:          1.561 m Patient Age:    31 years          BP:           123/93 mmHg Patient Gender: F                 HR:           107 bpm. Exam Location:  Inpatient Procedure: 2D Echo, Cardiac Doppler and Color Doppler Indications:    CHF- Acute systolic  History:        Patient has prior history of Echocardiogram examinations, most                 recent 03/10/2021. CHF; Risk Factors:Diabetes.  Sonographer:    Jefferey Pica Referring Phys: [id number] Margie Billet IMPRESSIONS  1. Hypokinesis of the infeirior, inferoseptal walls . Left ventricular ejection fraction, by estimation, is 45 to 50%. The left ventricle has mildly decreased function.  2. Right ventricular systolic function is normal. The right ventricular size is normal. There is normal pulmonary artery systolic pressure.  3. . The mitral valve is normal in structure. Mild to moderate mitral valve regurgitation.  4. The aortic valve is normal in structure. Aortic valve regurgitation is mild. Comparison(s): The left ventricular function is unchanged. FINDINGS  Left Ventricle: Hypokinesis of the infeirior, inferoseptal walls. Left ventricular ejection fraction, by estimation, is 45 to 50%. The left ventricle has  mildly decreased function. The left ventricular internal cavity size was normal in size. There is no left ventricular hypertrophy. Right Ventricle: The right ventricular size is normal. Right vetricular wall thickness was not assessed. Right ventricular systolic function is normal. There is normal pulmonary artery systolic pressure. The tricuspid regurgitant velocity is 2.50 m/s, and with an assumed  right atrial pressure of 5 mmHg, the estimated right ventricular systolic pressure is 38.1 mmHg. Left Atrium: Left atrial size was normal in size. Right Atrium: Right atrial size was normal in size. Pericardium: There is no evidence of pericardial effusion. Mitral Valve: The mitral valve is normal in structure. Mild to moderate mitral valve regurgitation. Tricuspid Valve: The tricuspid valve is normal in structure. Tricuspid valve regurgitation is mild. Aortic Valve: The aortic valve is normal in structure. Aortic valve regurgitation is mild. Aortic regurgitation PHT measures 530 msec. Aortic valve peak gradient measures 7.4 mmHg. Pulmonic Valve: The pulmonic valve was normal in structure. Pulmonic valve regurgitation is mild. Aorta: The aortic root and ascending aorta are structurally normal, with no evidence of dilitation. IAS/Shunts: No atrial level shunt detected by color flow Doppler.  LEFT VENTRICLE PLAX 2D LVIDd:         4.50 cm     Diastology LVIDs:         3.40 cm     LV e' lateral:   10.30 cm/s LV PW:         1.10 cm     LV E/e' lateral: 9.8 LV IVS:        1.10 cm LVOT diam:     1.90 cm LV SV:         58 LV SV Index:   37 LVOT Area:     2.84 cm  LV Volumes (MOD) LV vol d, MOD A4C: 92.8 ml LV vol s, MOD A4C: 56.3 ml LV SV MOD A4C:     92.8 ml RIGHT VENTRICLE RV Basal diam:  2.60 cm RV S prime:     11.30 cm/s TAPSE (M-mode): 1.7 cm LEFT ATRIUM             Index        RIGHT ATRIUM           Index LA diam:        3.10 cm 1.99 cm/m   RA Area:     10.10 cm LA Vol (A2C):   32.6 ml 20.88 ml/m  RA Volume:   21.60 ml  13.84 ml/m LA Vol (A4C):   30.0 ml 19.22 ml/m LA Biplane Vol: 31.7 ml 20.31 ml/m  AORTIC VALVE                 PULMONIC VALVE AV Area (Vmax): 2.50 cm     PV Vmax:       1.09 m/s AV Vmax:        136.00 cm/s  PV Peak grad:  4.8 mmHg AV Peak Grad:   7.4 mmHg LVOT Vmax:      120.00 cm/s LVOT Vmean:     76.300 cm/s LVOT VTI:       0.203 m AI PHT:         530 msec  AORTA Ao Root diam: 2.90 cm Ao Asc  diam:  2.90 cm MITRAL VALVE                TRICUSPID VALVE MV Area (PHT): 6.54 cm     TR Peak grad:   25.0 mmHg MV Decel Time:  116 msec     TR Vmax:        250.00 cm/s MV E velocity: 101.00 cm/s MV A velocity: 83.70 cm/s   SHUNTS MV E/A ratio:  1.21         Systemic VTI:  0.20 m                             Systemic Diam: 1.90 cm Dorris Carnes MD Electronically signed by Dorris Carnes MD Signature Date/Time: 08/15/2021/4:12:12 PM    Final      Discharge Exam: Vitals:   08/17/21 0448 08/17/21 0840  BP: 137/88 137/87  Pulse: 94 96  Resp: 20 20  Temp: 97.9 F (36.6 C) 98 F (36.7 C)  SpO2: 97% 98%   Vitals:   08/16/21 1900 08/16/21 2354 08/17/21 0448 08/17/21 0840  BP: (!) 133/95 (!) 154/92 137/88 137/87  Pulse: 92 95 94 96  Resp: 18 18 20 20   Temp: 98.1 F (36.7 C) 97.6 F (36.4 C) 97.9 F (36.6 C) 98 F (36.7 C)  TempSrc: Oral Oral Oral Oral  SpO2: 98% 98% 97% 98%  Weight:   65.4 kg   Height:        General: Pt is alert, awake, not in acute distress Cardiovascular: RRR, S1/S2 +, no rubs, no gallops Respiratory: CTA bilaterally, no wheezing, no rhonchi Abdominal: Soft, NT, ND, bowel sounds + Extremities: no edema, no cyanosis    The results of significant diagnostics from this hospitalization (including imaging, microbiology, ancillary and laboratory) are listed below for reference.     Microbiology: No results found for this or any previous visit (from the past 240 hour(s)).   Labs: BNP (last 3 results) Recent Labs    05/13/21 1715 08/10/21 1443 08/13/21 1837  BNP 12.0 18.3 62.1   Basic Metabolic Panel: Recent Labs  Lab 08/14/21 0417 08/14/21 1158 08/14/21 2039 08/15/21 0003 08/15/21 0323 08/15/21 0556 08/15/21 1451 08/15/21 1902 08/16/21 0023 08/16/21 0354 08/17/21 0434  NA 130*  133*  --  129*  --  132*  --   --   --  137  --  135  K 4.8  4.9   < > 5.6*   < > 4.9   < > 5.0 4.6 4.7 4.4 4.1  CL 96*  97*  --  97*  --  98  --   --   --  105  --  105  CO2  19*  18*  --  18*  --  19*  --   --   --  20*  --  21*  GLUCOSE 264*  261*  --  461*  --  292*  --   --   --  288*  --  93  BUN 131*  130*  --  129*  --  117*  --   --   --  83*  --  50*  CREATININE 3.44*  3.42*  --  2.92*  --  2.43*  --   --   --  2.02*  --  1.35*  CALCIUM 9.6  9.8  --  8.5*  --  9.1  --   --   --  9.0  --  9.4   < > = values in this interval not displayed.   Liver Function Tests: Recent Labs  Lab 08/13/21 1941 08/14/21 0417  AST 78* 59*  ALT 103* 87*  ALKPHOS 72 79  BILITOT 0.6  0.6  PROT 8.3* 7.9  ALBUMIN 4.4 4.2   No results for input(s): LIPASE, AMYLASE in the last 168 hours. No results for input(s): AMMONIA in the last 168 hours. CBC: Recent Labs  Lab 08/10/21 1445 08/14/21 0417 08/15/21 0323 08/16/21 0023 08/17/21 0434  WBC 6.2 6.0 6.2 5.4 6.3  HGB 12.1 12.4 11.5* 11.2* 11.2*  HCT 36.3 36.8 33.4* 32.2* 33.8*  MCV 89.6 88.2 87.0 88.2 88.5  PLT 197 153 142* 142* 136*   Cardiac Enzymes: No results for input(s): CKTOTAL, CKMB, CKMBINDEX, TROPONINI in the last 168 hours. BNP: Invalid input(s): POCBNP CBG: Recent Labs  Lab 08/16/21 1135 08/16/21 1644 08/16/21 2135 08/17/21 0637 08/17/21 0842  GLUCAP 372* 212* 240* 85 138*   D-Dimer No results for input(s): DDIMER in the last 72 hours. Hgb A1c Recent Labs    08/15/21 0556  HGBA1C 7.9*   Lipid Profile No results for input(s): CHOL, HDL, LDLCALC, TRIG, CHOLHDL, LDLDIRECT in the last 72 hours. Thyroid function studies No results for input(s): TSH, T4TOTAL, T3FREE, THYROIDAB in the last 72 hours.  Invalid input(s): FREET3 Anemia work up No results for input(s): VITAMINB12, FOLATE, FERRITIN, TIBC, IRON, RETICCTPCT in the last 72 hours. Urinalysis    Component Value Date/Time   COLORURINE YELLOW 08/14/2021 0858   APPEARANCEUR CLEAR 08/14/2021 0858   LABSPEC 1.009 08/14/2021 0858   PHURINE 5.0 08/14/2021 0858   GLUCOSEU NEGATIVE 08/14/2021 0858   HGBUR NEGATIVE 08/14/2021 0858    [place] 08/14/2021 0858   KETONESUR NEGATIVE 08/14/2021 0858   PROTEINUR NEGATIVE 08/14/2021 0858   NITRITE NEGATIVE 08/14/2021 0858   LEUKOCYTESUR NEGATIVE 08/14/2021 0858   Sepsis Labs Invalid input(s): PROCALCITONIN,  WBC,  LACTICIDVEN Microbiology No results found for this or any previous visit (from the past 240 hour(s)).   Time coordinating discharge: Over 30 minutes  SIGNED:   Darliss Cheney, MD  [facility] 08/17/2021, 10:26 AM *Please note that this is a verbal dictation therefore any spelling or grammatical errors are due to the "[employer]" system interpretation. If 7PM-7AM, please contact night-coverage [email or web address]

## 2021-08-17 NOTE — TOC CM/SW Note (Signed)
HF TOC CM sent referral for HF Paramedicine team to follow at home. [place], Heart Failure TOC CM [phone]

## 2021-08-17 NOTE — Progress Notes (Signed)
Advanced Heart Failure Rounding Note   Subjective:    Feels good today. Denies CP or SOB.  Remains off diuretics. Scr has normalized. Weight up 1 pound.    Objective:   Weight Range:  Vital Signs:   Temp:  [97.6 F (36.4 C)-98.5 F (36.9 C)] 97.9 F (36.6 C) (05/23 0448) Pulse Rate:  [92-95] 94 (05/23 0448) Resp:  [18-20] 20 (05/23 0448) BP: (112-154)/(81-95) 137/88 (05/23 0448) SpO2:  [97 %-98 %] 97 % (05/23 0448) Weight:  [65.4 kg] 65.4 kg (05/23 0448) Last BM Date : 08/16/21  Weight change: Filed Weights   08/15/21 0430 08/16/21 0358 08/17/21 0448  Weight: 65 kg 65.1 kg 65.4 kg    Intake/Output:   Intake/Output Summary (Last 24 hours) at 08/17/2021 8295 Last data filed at 08/16/2021 1500 Gross per 24 hour  Intake 850.09 ml  Output --  Net 850.09 ml     Physical Exam: General: Sitting in chair No resp difficulty HEENT: normal R ptosis Neck: supple. JVP no elevated . Carotids 2+ bilat; no bruits. No lymphadenopathy or thryomegaly appreciated. Cor: PMI nondisplaced. Regular rate & rhythm. No rubs, gallops or murmurs. Lungs: clear Abdomen: soft, nontender, nondistended. No hepatosplenomegaly. No bruits or masses. Good bowel sounds. Extremities: no cyanosis, clubbing, rash, edema Neuro: alert & orientedx3, cranial nerves grossly intact. Weak on left. Affect pleasant  Telemetry: sinus 90s  Labs: Basic Metabolic Panel: Recent Labs  Lab 08/14/21 0417 08/14/21 1158 08/14/21 2039 08/15/21 0003 08/15/21 0323 08/15/21 0556 08/15/21 1451 08/15/21 1902 08/16/21 0023 08/16/21 0354 08/17/21 0434  NA 130*  133*  --  129*  --  132*  --   --   --  137  --  135  K 4.8  4.9   < > 5.6*   < > 4.9   < > 5.0 4.6 4.7 4.4 4.1  CL 96*  97*  --  97*  --  98  --   --   --  105  --  105  CO2 19*  18*  --  18*  --  19*  --   --   --  20*  --  21*  GLUCOSE 264*  261*  --  461*  --  292*  --   --   --  288*  --  93  BUN 131*  130*  --  129*  --  117*  --   --   --   83*  --  50*  CREATININE 3.44*  3.42*  --  2.92*  --  2.43*  --   --   --  2.02*  --  1.35*  CALCIUM 9.6  9.8  --  8.5*  --  9.1  --   --   --  9.0  --  9.4   < > = values in this interval not displayed.    Liver Function Tests: Recent Labs  Lab 08/13/21 1941 08/14/21 0417  AST 78* 59*  ALT 103* 87*  ALKPHOS 72 79  BILITOT 0.6 0.6  PROT 8.3* 7.9  ALBUMIN 4.4 4.2   No results for input(s): LIPASE, AMYLASE in the last 168 hours. No results for input(s): AMMONIA in the last 168 hours.  CBC: Recent Labs  Lab 08/10/21 1445 08/14/21 0417 08/15/21 0323 08/16/21 0023 08/17/21 0434  WBC 6.2 6.0 6.2 5.4 6.3  HGB 12.1 12.4 11.5* 11.2* 11.2*  HCT 36.3 36.8 33.4* 32.2* 33.8*  MCV 89.6 88.2 87.0 88.2 88.5  PLT 197 153 142* 142* 136*    Cardiac Enzymes: No results for input(s): CKTOTAL, CKMB, CKMBINDEX, TROPONINI in the last 168 hours.  BNP: BNP (last 3 results) Recent Labs    05/13/21 1715 08/10/21 1443 08/13/21 1837  BNP 12.0 18.3 11.6    ProBNP (last 3 results) No results for input(s): PROBNP in the last 8760 hours.    Other results:  Imaging: ECHOCARDIOGRAM COMPLETE  Result Date: 08/15/2021    ECHOCARDIOGRAM REPORT   Patient Name:   Jennifer [name] Date of Exam: 08/15/2021 Medical Rec #:  [id number]         Height:       57.0 in Accession #:    [id number]        Weight:       143.3 lb Date of Birth:  12/04/1990         BSA:          1.561 m Patient Age:    31 years          BP:           123/93 mmHg Patient Gender: F                 HR:           107 bpm. Exam Location:  Inpatient Procedure: 2D Echo, Cardiac Doppler and Color Doppler Indications:    CHF- Acute systolic  History:        Patient has prior history of Echocardiogram examinations, most                 recent 03/10/2021. CHF; Risk Factors:Diabetes.  Sonographer:    Jefferey Pica Referring Phys: [id number] Margie Billet IMPRESSIONS  1. Hypokinesis of the infeirior, inferoseptal walls . Left ventricular ejection  fraction, by estimation, is 45 to 50%. The left ventricle has mildly decreased function.  2. Right ventricular systolic function is normal. The right ventricular size is normal. There is normal pulmonary artery systolic pressure.  3. . The mitral valve is normal in structure. Mild to moderate mitral valve regurgitation.  4. The aortic valve is normal in structure. Aortic valve regurgitation is mild. Comparison(s): The left ventricular function is unchanged. FINDINGS  Left Ventricle: Hypokinesis of the infeirior, inferoseptal walls. Left ventricular ejection fraction, by estimation, is 45 to 50%. The left ventricle has mildly decreased function. The left ventricular internal cavity size was normal in size. There is no left ventricular hypertrophy. Right Ventricle: The right ventricular size is normal. Right vetricular wall thickness was not assessed. Right ventricular systolic function is normal. There is normal pulmonary artery systolic pressure. The tricuspid regurgitant velocity is 2.50 m/s, and with an assumed right atrial pressure of 5 mmHg, the estimated right ventricular systolic pressure is 81.0 mmHg. Left Atrium: Left atrial size was normal in size. Right Atrium: Right atrial size was normal in size. Pericardium: There is no evidence of pericardial effusion. Mitral Valve: The mitral valve is normal in structure. Mild to moderate mitral valve regurgitation. Tricuspid Valve: The tricuspid valve is normal in structure. Tricuspid valve regurgitation is mild. Aortic Valve: The aortic valve is normal in structure. Aortic valve regurgitation is mild. Aortic regurgitation PHT measures 530 msec. Aortic valve peak gradient measures 7.4 mmHg. Pulmonic Valve: The pulmonic valve was normal in structure. Pulmonic valve regurgitation is mild. Aorta: The aortic root and ascending aorta are structurally normal, with no evidence of dilitation. IAS/Shunts: No atrial level shunt detected by color flow Doppler.  LEFT VENTRICLE  PLAX 2D LVIDd:         4.50 cm     Diastology LVIDs:         3.40 cm     LV e' lateral:   10.30 cm/s LV PW:         1.10 cm     LV E/e' lateral: 9.8 LV IVS:        1.10 cm LVOT diam:     1.90 cm LV SV:         58 LV SV Index:   37 LVOT Area:     2.84 cm  LV Volumes (MOD) LV vol d, MOD A4C: 92.8 ml LV vol s, MOD A4C: 56.3 ml LV SV MOD A4C:     92.8 ml RIGHT VENTRICLE RV Basal diam:  2.60 cm RV S prime:     11.30 cm/s TAPSE (M-mode): 1.7 cm LEFT ATRIUM             Index        RIGHT ATRIUM           Index LA diam:        3.10 cm 1.99 cm/m   RA Area:     10.10 cm LA Vol (A2C):   32.6 ml 20.88 ml/m  RA Volume:   21.60 ml  13.84 ml/m LA Vol (A4C):   30.0 ml 19.22 ml/m LA Biplane Vol: 31.7 ml 20.31 ml/m  AORTIC VALVE                 PULMONIC VALVE AV Area (Vmax): 2.50 cm     PV Vmax:       1.09 m/s AV Vmax:        136.00 cm/s  PV Peak grad:  4.8 mmHg AV Peak Grad:   7.4 mmHg LVOT Vmax:      120.00 cm/s LVOT Vmean:     76.300 cm/s LVOT VTI:       0.203 m AI PHT:         530 msec  AORTA Ao Root diam: 2.90 cm Ao Asc diam:  2.90 cm MITRAL VALVE                TRICUSPID VALVE MV Area (PHT): 6.54 cm     TR Peak grad:   25.0 mmHg MV Decel Time: 116 msec     TR Vmax:        250.00 cm/s MV E velocity: 101.00 cm/s MV A velocity: 83.70 cm/s   SHUNTS MV E/A ratio:  1.21         Systemic VTI:  0.20 m                             Systemic Diam: 1.90 cm Dorris Carnes MD Electronically signed by Dorris Carnes MD Signature Date/Time: 08/15/2021/4:12:12 PM    Final      Medications:     Scheduled Medications:  aspirin EC  81 mg Oral Daily   carvedilol  6.25 mg Oral BID   DULoxetine  30 mg Oral QHS   fenofibrate  160 mg Oral Daily   heparin  5,000 Units Subcutaneous Q8H   insulin aspart  0-15 Units Subcutaneous TID WC   insulin aspart  0-5 Units Subcutaneous QHS   insulin aspart  20 Units Subcutaneous TID WC   insulin glargine-yfgn  100 Units Subcutaneous BID   ivabradine  5 mg Oral BID WC   levothyroxine  125 mcg Oral  Q0600   pantoprazole  40 mg Oral Daily   pregabalin  150 mg Oral QHS   sodium chloride flush  3 mL Intravenous Q12H   torsemide  40 mg Oral BID    Infusions:   PRN Medications: acetaminophen **OR** acetaminophen, albuterol, nitroGLYCERIN, ondansetron **OR** ondansetron (ZOFRAN) IV   Assessment/Plan:   Hyperkalemia - admit K >7.5 - treated w/ Lokelma, insulin, and IV calcium gluconate - resolved     2. AKI  - Scr 3.6 on admit (BL ~1.2)  - improved w/ IVFs + diuretic hold - Scr down to 1.3 today    3. Chronic Systolic Heart Failure - likely NICM. - Echo (1/22): EF 35-40% Mod MR - Echo (11/28/20): EF 20-25% with moderate RV dysfunction and mild septal flattening. Mod-severe TR - Rheum serologies negative, but Rheumatoid factor mildly elevated at 15.5 - cMRI  9/22 EF 29%c/w prior myocarditis vs sarcoid. May have delayed chemo cardiotoxcity.  - R/LHC with single vessel RCA occlusion, RA 1, LVEDP 17, Fick CO 5.6/CI 3.88. - Echo (10/22): EF 45-50%, grade II DD, RV ok. - Echo 12/22: EF 45%, RV normal  - RHC 5/23 with elevated filling pressures and normal CO, RA 12 (with prominent v-waves), RV 47/14, PA 48/25 (37), PCW 25 - Echo this admit EF 45-50%, RV normal  - Holding diuretics/Jardiance w/ AKI.  - Hold spiro and Entresto w/ hyperkalemia  - Continue Coreg 6.25 mg bid - Continue Corlanor 5 mg bid  - May benefit from CardioMEMs placement to help w/ outpatient fluid monitoring  - Renal function back to baseline. Ok for d/c today from HF standpoint on meds as below    4. H/o Poor Med Compliance - concern regarding pt's ability to mange meds correctly  - ? If AKI and hyperkalemia due to mismanagement of meds - she would benefit from paramedicine  - Long discussion about med complaince   5. CAD - Single vessel RCA occlusion (small co-dominant vessel) on cardiac cath 09/08. - Medical management.  - No s/s of ischemia - Continue aspirin + statin.   6.  Pulmonary  hypertension - Mild to moderate on RHC 5/23. PVR 3.7 WU. - Can consider cautious trial of selective pulmonary vasodilators when better diuresed.   7. TR - Prominent v-waves in RA tracing on RHC 5/23 - Echo this admit shows only mild TR    8. Severe hyperTG - Due to LPL deficiency w/ recent pancreatitis. - Continue fenofibrate + niacin  - Previously offered referral to [facility] for further management. She prefers to continue with her endocrinologist at [facility] who currently manages/follows.   9. Type I DM - Follows w/ endocrinology at [facility]. - Off SGLT2i.   10. Abdominal swelling/bloating - Seen at [facility] (11/20) for IBS, per chart review. - Had penumatosis intestinalis. She was started on pancreatic enzyme replacement. - EGD (10/21): normal, bx taken to assess for celiac. - CT abdomen (2/22) and RUQ US showed no obvious gallbladder stones. - HIDA (4/22): ended early due to tachypnea, but cystic duct and gallbladder appeared WNL. - CT abdomen 12/22 unremarkable  - suspect primarily fatty liver     Home today on following cardiac meds:  Torsemide 40 bid (decreased from 60 bid) Metolazone 2.5 once a week with KCL 40 (hold if not feeling edematous) Carvedilol 6.25 bid Jardiance 10 Entresto 49/51 bid Ivabradine 7.5 bid Crestor 40 ASA 81 Fenofibrate Repatha  Stop spiro  Will arrange HF f/u next week. Will apply  for insurance coverage for [employer].    Length of Stay: 3   Glori Bickers MD 08/17/2021, 8:21 AM  Advanced Heart Failure Team Pager [phone] (M-F; 7a - 4p)  Please contact [facility] for night-coverage after hours (4p -7a ) and weekends on [email or web address]

## 2021-08-18 ENCOUNTER — Telehealth (HOSPITAL_COMMUNITY): Payer: Self-pay | Admitting: Licensed Clinical Social Worker

## 2021-08-18 NOTE — Telephone Encounter (Unsigned)
CSW received referral for pt to be part of [employer].  Attempted to call to discuss with pt- unable to reach- left VM requesting return call  Jorge Ny, [facility] Desk#: [phone] Cell#: [phone]

## 2021-08-19 ENCOUNTER — Telehealth (HOSPITAL_COMMUNITY): Payer: Self-pay

## 2021-08-19 ENCOUNTER — Telehealth (HOSPITAL_COMMUNITY): Payer: Self-pay | Admitting: Licensed Clinical Social Worker

## 2021-08-19 NOTE — Progress Notes (Signed)
Heart and Vascular Care Navigation  08/19/2021  Jeffie Widdowson June 28, 1990 [id number]  Reason for Referral: paramedicine enrollment   Engaged with patient by telephone for initial visit for Heart and Vascular Care Coordination.  Pt agreeable to enrollment in paramedicine program.                                                                                                   Paramedicine Initial Assessment:  Housing:  In what kind of housing do you live? House/apt/trailer/shelter? apartment  Do you rent/pay a mortgage/own? rent  Do you live with anyone? spouse  Are you currently worried about losing your housing?  Not imminently concerned but admits it is tight to cover costs each month as pt spouse is the only source of income each month and this income is depleted by child support.  Social:  What is your current marital status? Married   Do you have any children? Step children who live in [place]  Do you have family or friends who live locally? No other family lives close by- reports all family living in [place]- moved here 4 years ago.   Food:  Reports concerns about being able to get enough food.  Gets food stamps about $100/month but not sufficient to cover all costs.  CSW mailed pt resources for local food pantries and gave referral to [facility] [employer].  Income:  What is your current source of income? Spouse is only source of household income   Insurance:  Are you currently insured? Yes- BCBS  Do you have prescription coverage? yes   Transportation:  Do you have transportation to your medical appointments? Has her own car- no concerns with making it to appts.   Daily Health Needs: Do you have a working scale at home? yes  How do you manage your medications at home? Have a pillbox that she is filling herself.  Has a lot of medications but feels like she has a good hang of it other than the new medications.  Do you ever take your medications differently than  prescribed? Reports compliance  Do you have issues affording your medications? Did at the beginning of the year when she hadn't met her deductible   Do you have any concerns with mobility at home? no  Do you use any assistive devices at home or have PCS at home? no  Do you have a PCP? [facility]  Do you have any trouble reading or writing? no  Are there any additional barriers you see to getting the care you need?   None reported                             HRT/VAS Care Coordination     Living arrangements for the past 2 months Apartment   Lives with: Spouse   Home Assistive Devices/Equipment CBG Meter   Current home services DME  scale       Social History:  SDOH Screenings   Alcohol Screen: Not on file  Depression (PHQ2-9): Not on file  Financial Resource Strain: Medium Risk   Difficulty of Paying Living Expenses: Somewhat hard  Food Insecurity: Food Insecurity Present   Worried About Running Out of Food in the Last Year: Sometimes true   Ran Out of Food in the Last Year: Never true  Housing: Low Risk    Last Housing Risk Score: 0  Physical Activity: Not on file  Social Connections: Not on file  Stress: Not on file  Tobacco Use: Low Risk    Smoking Tobacco Use: Never   Smokeless Tobacco Use: Never   Passive Exposure: Not on file  Transportation Needs: No Transportation Needs   Lack of Transportation (Medical): No   Lack of Transportation (Non-Medical): No    SDOH Interventions: Financial Resources:    Spouse works- pt has had no source of income since Oct 2022  Food Insecurity:  Food Insecurity Interventions: Other (Comment) (food pantries)  Housing Insecurity:  Housing Interventions: Intervention Not Indicated  Transportation:   Transportation Interventions: Intervention Not Indicated   Follow-up plan:    Referral sent to paramedics for assignment and follow up   CSW will  continue to follow through paramedicine program and assist as needed.  Jorge Ny, LCSW Clinical Social Worker [facility] Desk#: [phone] Cell#: [phone]

## 2021-08-19 NOTE — Telephone Encounter (Signed)
Contacted pt regarding referral to paramedicine, she is agreeable and I will call her on Monday to sch home visit.    Jennifer [name], [place] 08/19/2021'

## 2021-08-20 ENCOUNTER — Telehealth (HOSPITAL_COMMUNITY): Payer: Self-pay | Admitting: *Deleted

## 2021-08-20 NOTE — Telephone Encounter (Signed)
Pt called asking if she needed to take more lokelma. Pt advised she did not need any additional lokelma as her potassium was 4.1 on 5/23.

## 2021-08-22 ENCOUNTER — Telehealth: Payer: Self-pay | Admitting: Physician Assistant

## 2021-08-22 ENCOUNTER — Other Ambulatory Visit: Payer: Self-pay | Admitting: Physician Assistant

## 2021-08-22 MED ORDER — METOLAZONE 2.5 MG PO TABS: 2.5000 mg | ORAL_TABLET | ORAL | 0 refills | Status: DC

## 2021-08-22 NOTE — Telephone Encounter (Signed)
Paged by answering service. Patient in [place]. Since yesterday she has LE edema and abdominal bloating. Unable to weight herself. No urine output today. She has all meds but forget to get her metolazone with her. She did not required to take additional diuretics after recent discharge.   Sent metolazone 2.28m x 1 to requested pharmacy. [name] coming back tomorrow.

## 2021-08-25 ENCOUNTER — Other Ambulatory Visit (HOSPITAL_COMMUNITY): Payer: Self-pay

## 2021-08-25 ENCOUNTER — Other Ambulatory Visit (HOSPITAL_COMMUNITY): Payer: Self-pay | Admitting: Internal Medicine

## 2021-08-25 NOTE — Progress Notes (Signed)
Paramedicine Encounter    Patient ID: Jennifer [name], female    DOB: 1990/06/20, 31 y.o.   MRN: [id number]  First home visit with pt, she reports today she feels more fluttering and muscle cramps today-all over cramps.  She reports this happens occasionally-today is her metolazone day.  She lives with spouse. No kids of her own. She does not work, she reports new diagnosis. She reports back in 2019 she did give birth but baby was too early to survive and after that symptoms came on but resolved-she did not get treated for it, she states she went back to normal. She reports this started back up in august 2022.  She drives self /husband drives to appointments.  She uses Cytogeneticist. She denies any issues with affording meds due to her meeting her deductible now.   She reports her breathing feels sob as usual.  She lives on 2nd floor and has to walk up/down stairs and reports she does get sob with that.  She does get tired and sob when trying to do chores around the house.   She does understand why to weigh daily-it goes between 141-143.  She keeps under her fluid intake. She only takes in fluid when she takes her meds. She reports even when she drinks small amount of fluid she gets flutters and c/p.  Salt intake review--spoke about how to limit salt.  She does have some edema to her rt leg and she has left sided weakness from a benign brain tumor that she had when she was 31 y/o and during surgery a nerve was touched causing the weakness.   She seen her GI doc today-her creon is being d/c. They thought she had pancreatitis but she does not per the biopsy of her pancreas.   Med review- She has not been taking it the jardiance-the side effects are intolerable for her and does not want to continue.  She has been taking the 24-26 mg entresto-she does not have the 49-5m. Clarification needs to be done about the entresto dosing and if she even needs to be taking it-there was a note the  day of when she went to ER from clinic to hold entresto spiro metolazone and potassium but then she went to ER and was admitted for a few days. So unsure of correct dose--will clarify tomor at clinic appointment.  Advice for today with her being orthostatic to hold off on spiro and entresto until tomor- She took her metolazone on Monday.  12 lead done today and t waves are not peaked.  No other changes noted on 12 lead.   CBG's over 400 consistently  CBG PTA-197  B/p standing-82/60 BP 112/74   Pulse (!) 106   Resp 18   Wt 141 lb (64 kg)   SpO2 99%   BMI 30.51 kg/m  Weight yesterday-141 Last visit weight-?  Patient Care Team: PElinor Parkinsonas PCP - General (Physician Assistant) BJacelyn Pi MD as PCP - Endocrinology (Endocrinology)  Patient Active Problem List   Diagnosis Date Noted   Hyperkalemia 08/14/2021   Hx of insulin dependent diabetes mellitus 08/14/2021   Acute kidney failure ([facility] 05/13/2021   Chest pain 03/10/2021   Acute on chronic combined systolic and diastolic CHF (congestive heart failure) ([facility] 02/09/2021   History of astrocytoma of brain 02/09/2021   Severe hyperglycemia due to diabetes mellitus ([facility] 01/25/2021   Diarrhea    Elevated transaminase level    Acute combined systolic and diastolic  heart failure (HCC)    Nonischemic cardiomyopathy (HCC)    Acute decompensated heart failure (HCC)    Elevated liver enzymes    Acute renal failure superimposed on stage 3a chronic kidney disease ([place]) 11/26/2020   Abdominal pain 94/09/6806   Chronic systolic CHF (congestive heart failure) ([place]) 11/23/2020   Essential hypertension 11/23/2020   Hypertriglyceridemia 11/23/2020   DM (diabetes mellitus) ([place]) 10/10/2018   Hypothyroidism 10/10/2018    Current Outpatient Medications:    albuterol (VENTOLIN HFA) 108 (90 Base) MCG/ACT inhaler, Inhale 1 puff into the lungs every 6 (six) hours as needed for wheezing or shortness of breath., Disp: , Rfl:     aspirin EC 81 MG EC tablet, Take 1 tablet (81 mg total) by mouth daily. Swallow whole., Disp: 30 tablet, Rfl: 1   carvedilol (COREG) 6.25 MG tablet, Take 1 tablet (6.25 mg total) by mouth 2 (two) times daily., Disp: 60 tablet, Rfl: 0   cetirizine (ZYRTEC) 10 MG tablet, Take 10 mg by mouth daily., Disp: , Rfl:    doxycycline (VIBRA-TABS) 100 MG tablet, Take 100 mg by mouth 2 (two) times daily., Disp: , Rfl:    DULoxetine (CYMBALTA) 30 MG capsule, Take 30 mg by mouth at bedtime., Disp: , Rfl:    erythromycin ophthalmic ointment, Place one application. into the right eye at bedtime., Disp: , Rfl:    Evolocumab (REPATHA) 140 MG/ML SOSY, Inject 140 mg into the skin See admin instructions. Inject 140 mg subcutaneously every other Sunday evening, Disp: , Rfl:    fenofibrate 160 MG tablet, Take 1 tablet (160 mg total) by mouth daily., Disp: 30 tablet, Rfl: 1   fluticasone (FLONASE) 50 MCG/ACT nasal spray, Place 1 spray into both nostrils daily., Disp: , Rfl:    insulin regular human CONCENTRATED (HUMULIN R) 500 UNIT/ML injection, Inject 260 Units into the skin See admin instructions. 260 units 3 times daily with each meal, 100 units at bedtime, Disp: , Rfl:    ivabradine (CORLANOR) 7.5 MG TABS tablet, Take 1 tablet (7.5 mg total) by mouth 2 (two) times daily with a meal., Disp: 60 tablet, Rfl: 6   JARDIANCE 10 MG TABS tablet, Take 10 mg by mouth daily. (Patient not taking: Reported on 08/14/2021), Disp: , Rfl:    JENCYCLA 0.35 MG tablet, Take 1 tablet by mouth daily., Disp: , Rfl:    levothyroxine (SYNTHROID) 125 MCG tablet, Take 1 tablet (125 mcg total) by mouth daily at 6 (six) AM., Disp: 30 tablet, Rfl: 1   lipase/protease/amylase (CREON) 36000 UNITS CPEP capsule, Take 81,103-15,945 Units by mouth See admin instructions. 72,000 units with each meal, 36,000 units with snacks, Disp: , Rfl:    metolazone (ZAROXOLYN) 2.5 MG tablet, Take 1 tablet (2.5 mg total) by mouth once a week for 10 doses., Disp: 1 tablet,  Rfl: 0   metroNIDAZOLE (FLAGYL) 250 MG tablet, Take 250 mg by mouth 3 (three) times daily., Disp: , Rfl:    Multiple Vitamin (MULTIVITAMIN WITH MINERALS) TABS tablet, Take 1 tablet by mouth daily., Disp: 30 tablet, Rfl: 1   nitroGLYCERIN (NITROSTAT) 0.4 MG SL tablet, Place 1 tablet (0.4 mg total) under the tongue every 5 (five) minutes as needed for up to 25 days for chest pain., Disp: 25 tablet, Rfl: 3   ofloxacin (OCUFLOX) 0.3 % ophthalmic solution, Place 1 drop into both eyes in the morning, at noon, in the evening, and at bedtime., Disp: , Rfl:    ondansetron (ZOFRAN) 4 MG tablet, Take 4 mg  by mouth every 8 (eight) hours as needed for nausea or vomiting., Disp: , Rfl:    pantoprazole (PROTONIX) 40 MG tablet, Take 40 mg by mouth daily., Disp: , Rfl:    potassium chloride (KLOR-CON M) 20 MEQ tablet, Take 2 tablets (40 mEq total) by mouth once a week for 10 doses., Disp: 20 tablet, Rfl: 0   pregabalin (LYRICA) 100 MG capsule, Take 1-2 capsules (100-200 mg total) by mouth See admin instructions. 200 mg in the morning, 100 mg in the afternnon, Disp: 120 capsule, Rfl: 10   pregabalin (LYRICA) 300 MG capsule, Take 300 mg by mouth at bedtime., Disp: , Rfl:    prochlorperazine (COMPAZINE) 10 MG tablet, Take 1 tablet (10 mg total) by mouth every 6 (six) hours as needed for nausea or vomiting., Disp: 20 tablet, Rfl: 0   promethazine (PHENERGAN) 25 MG tablet, Take 25 mg by mouth every 6 (six) hours as needed for vomiting or nausea., Disp: , Rfl:    rosuvastatin (CRESTOR) 40 MG tablet, Take 40 mg by mouth daily., Disp: , Rfl:    sacubitril-valsartan (ENTRESTO) 49-51 MG, Take 1 tablet by mouth every 12 (twelve) hours., Disp: , Rfl:    torsemide (DEMADEX) 20 MG tablet, Take 2 tablets (40 mg total) by mouth 2 (two) times daily., Disp: 60 tablet, Rfl: 11   tretinoin (RETIN-A) 0.05 % cream, Apply 1 application topically at bedtime., Disp: , Rfl:  Allergies  Allergen Reactions   Ketamine Other (See Comments)     In vegetative state for 15 minutes per pt   Clarithromycin Other (See Comments)    Stomach pain    Fentanyl Nausea And Vomiting   Maitake Mushroom [Maitake] Itching    Itchy throat   Penicillin V Itching    Gi upset   Shellfish Allergy Swelling and Other (See Comments)    Throat swelling. Pt has never had shellfish but assumes it will cause swelling. Pt states contrast in CT is okay   Morphine Itching and Rash   Penicillins Rash    Has patient had a PCN reaction causing immediate rash, facial/tongue/throat swelling, SOB or lightheadedness with hypotension: Y Has patient had a PCN reaction causing severe rash involving mucus membranes or skin necrosis: Y Has patient had a PCN reaction that required hospitalization: N Has patient had a PCN reaction occurring within the last 10 years: Y If all of the above answers are "NO", then may proceed with Cephalosporin use.    Prednisone Rash      Social History   Socioeconomic History   Marital status: Married    Spouse name: Not on file   Number of children: 0   Years of education: Not on file   Highest education level: Not on file  Occupational History   Occupation: [employer]  Tobacco Use   Smoking status: Never   Smokeless tobacco: Never  Vaping Use   Vaping Use: Never used  Substance and Sexual Activity   Alcohol use: Never   Drug use: Never   Sexual activity: Yes  Other Topics Concern   Not on file  Social History Narrative   Not on file   Social Determinants of Health   Financial Resource Strain: Medium Risk   Difficulty of Paying Living Expenses: Somewhat hard  Food Insecurity: Food Insecurity Present   Worried About Running Out of Food in the Last Year: Sometimes true   Ran Out of Food in the Last Year: Never true  Transportation Needs: No Transportation Needs  Lack of Transportation (Medical): No   Lack of Transportation (Non-Medical): No  Physical Activity: Not on file  Stress: Not on file  Social  Connections: Not on file  Intimate Partner Violence: Not on file    Physical Exam      Future Appointments  Date Time Provider [place]  08/26/2021 11:00 AM [facility] PA/NP [facility] None  09/02/2021 12:00 PM [facility] PA/NP [facility] None  09/24/2021  1:40 PM [name], Shaune Pascal, MD [place] None       [name], [place] Paramedic  08/25/21

## 2021-08-26 ENCOUNTER — Other Ambulatory Visit: Payer: Self-pay

## 2021-08-26 ENCOUNTER — Encounter (HOSPITAL_BASED_OUTPATIENT_CLINIC_OR_DEPARTMENT_OTHER): Payer: Self-pay | Admitting: Obstetrics and Gynecology

## 2021-08-26 ENCOUNTER — Other Ambulatory Visit (HOSPITAL_COMMUNITY): Payer: Self-pay

## 2021-08-26 ENCOUNTER — Ambulatory Visit (HOSPITAL_BASED_OUTPATIENT_CLINIC_OR_DEPARTMENT_OTHER)
Admit: 2021-08-26 | Discharge: 2021-08-26 | Disposition: A | Discharge status: Home / Self Care | Payer: BC Managed Care – PPO | Attending: Family Medicine | Admitting: Family Medicine

## 2021-08-26 ENCOUNTER — Telehealth (HOSPITAL_COMMUNITY): Payer: Self-pay

## 2021-08-26 ENCOUNTER — Inpatient Hospital Stay (HOSPITAL_BASED_OUTPATIENT_CLINIC_OR_DEPARTMENT_OTHER)
Admission: EM | Admit: 2021-08-26 | Discharge: 2021-09-02 | DRG: 683 | Disposition: A | Discharge status: Home / Self Care | Payer: BC Managed Care – PPO | Attending: Family Medicine | Admitting: Family Medicine

## 2021-08-26 ENCOUNTER — Encounter (HOSPITAL_COMMUNITY): Payer: Self-pay

## 2021-08-26 ENCOUNTER — Emergency Department (HOSPITAL_BASED_OUTPATIENT_CLINIC_OR_DEPARTMENT_OTHER): Payer: BC Managed Care – PPO

## 2021-08-26 VITALS — BP 136/90 | HR 104 | Wt 142.6 lb

## 2021-08-26 DIAGNOSIS — E875 Hyperkalemia: Secondary | ICD-10-CM | POA: Diagnosis present

## 2021-08-26 DIAGNOSIS — E781 Pure hyperglyceridemia: Secondary | ICD-10-CM | POA: Diagnosis present

## 2021-08-26 DIAGNOSIS — Z79899 Other long term (current) drug therapy: Secondary | ICD-10-CM

## 2021-08-26 DIAGNOSIS — I130 Hypertensive heart and chronic kidney disease with heart failure and stage 1 through stage 4 chronic kidney disease, or unspecified chronic kidney disease: Secondary | ICD-10-CM | POA: Diagnosis present

## 2021-08-26 DIAGNOSIS — Z7982 Long term (current) use of aspirin: Secondary | ICD-10-CM

## 2021-08-26 DIAGNOSIS — E872 Acidosis, unspecified: Secondary | ICD-10-CM | POA: Diagnosis not present

## 2021-08-26 DIAGNOSIS — R0609 Other forms of dyspnea: Secondary | ICD-10-CM | POA: Insufficient documentation

## 2021-08-26 DIAGNOSIS — I110 Hypertensive heart disease with heart failure: Secondary | ICD-10-CM | POA: Insufficient documentation

## 2021-08-26 DIAGNOSIS — Z85841 Personal history of malignant neoplasm of brain: Secondary | ICD-10-CM

## 2021-08-26 DIAGNOSIS — Z794 Long term (current) use of insulin: Secondary | ICD-10-CM

## 2021-08-26 DIAGNOSIS — I3139 Other pericardial effusion (noninflammatory): Secondary | ICD-10-CM | POA: Insufficient documentation

## 2021-08-26 DIAGNOSIS — R609 Edema, unspecified: Secondary | ICD-10-CM | POA: Diagnosis not present

## 2021-08-26 DIAGNOSIS — Z79890 Hormone replacement therapy (postmenopausal): Secondary | ICD-10-CM

## 2021-08-26 DIAGNOSIS — Z8349 Family history of other endocrine, nutritional and metabolic diseases: Secondary | ICD-10-CM | POA: Insufficient documentation

## 2021-08-26 DIAGNOSIS — R0601 Orthopnea: Secondary | ICD-10-CM | POA: Insufficient documentation

## 2021-08-26 DIAGNOSIS — K861 Other chronic pancreatitis: Secondary | ICD-10-CM | POA: Insufficient documentation

## 2021-08-26 DIAGNOSIS — Z800 Family history of malignant neoplasm of digestive organs: Secondary | ICD-10-CM

## 2021-08-26 DIAGNOSIS — E119 Type 2 diabetes mellitus without complications: Secondary | ICD-10-CM | POA: Insufficient documentation

## 2021-08-26 DIAGNOSIS — I10 Essential (primary) hypertension: Secondary | ICD-10-CM | POA: Diagnosis not present

## 2021-08-26 DIAGNOSIS — Z8719 Personal history of other diseases of the digestive system: Secondary | ICD-10-CM | POA: Insufficient documentation

## 2021-08-26 DIAGNOSIS — I5042 Chronic combined systolic (congestive) and diastolic (congestive) heart failure: Secondary | ICD-10-CM | POA: Diagnosis present

## 2021-08-26 DIAGNOSIS — N1831 Chronic kidney disease, stage 3a: Secondary | ICD-10-CM | POA: Diagnosis not present

## 2021-08-26 DIAGNOSIS — E1122 Type 2 diabetes mellitus with diabetic chronic kidney disease: Secondary | ICD-10-CM | POA: Diagnosis present

## 2021-08-26 DIAGNOSIS — K638219 Small intestinal bacterial overgrowth, unspecified: Secondary | ICD-10-CM

## 2021-08-26 DIAGNOSIS — E871 Hypo-osmolality and hyponatremia: Secondary | ICD-10-CM | POA: Diagnosis not present

## 2021-08-26 DIAGNOSIS — Z91013 Allergy to seafood: Secondary | ICD-10-CM

## 2021-08-26 DIAGNOSIS — R600 Localized edema: Secondary | ICD-10-CM | POA: Diagnosis not present

## 2021-08-26 DIAGNOSIS — I428 Other cardiomyopathies: Secondary | ICD-10-CM | POA: Diagnosis present

## 2021-08-26 DIAGNOSIS — Z7984 Long term (current) use of oral hypoglycemic drugs: Secondary | ICD-10-CM

## 2021-08-26 DIAGNOSIS — I272 Pulmonary hypertension, unspecified: Secondary | ICD-10-CM | POA: Insufficient documentation

## 2021-08-26 DIAGNOSIS — E1165 Type 2 diabetes mellitus with hyperglycemia: Secondary | ICD-10-CM | POA: Diagnosis present

## 2021-08-26 DIAGNOSIS — Z833 Family history of diabetes mellitus: Secondary | ICD-10-CM | POA: Insufficient documentation

## 2021-08-26 DIAGNOSIS — Z87440 Personal history of urinary (tract) infections: Secondary | ICD-10-CM | POA: Insufficient documentation

## 2021-08-26 DIAGNOSIS — Z9889 Other specified postprocedural states: Secondary | ICD-10-CM | POA: Insufficient documentation

## 2021-08-26 DIAGNOSIS — N1832 Chronic kidney disease, stage 3b: Secondary | ICD-10-CM | POA: Diagnosis present

## 2021-08-26 DIAGNOSIS — E1100 Type 2 diabetes mellitus with hyperosmolarity without nonketotic hyperglycemic-hyperosmolar coma (NKHHC): Secondary | ICD-10-CM | POA: Diagnosis present

## 2021-08-26 DIAGNOSIS — E786 Lipoprotein deficiency: Secondary | ICD-10-CM | POA: Insufficient documentation

## 2021-08-26 DIAGNOSIS — Z880 Allergy status to penicillin: Secondary | ICD-10-CM

## 2021-08-26 DIAGNOSIS — R739 Hyperglycemia, unspecified: Secondary | ICD-10-CM

## 2021-08-26 DIAGNOSIS — K589 Irritable bowel syndrome without diarrhea: Secondary | ICD-10-CM | POA: Insufficient documentation

## 2021-08-26 DIAGNOSIS — Z888 Allergy status to other drugs, medicaments and biological substances status: Secondary | ICD-10-CM

## 2021-08-26 DIAGNOSIS — E039 Hypothyroidism, unspecified: Secondary | ICD-10-CM | POA: Diagnosis not present

## 2021-08-26 DIAGNOSIS — K6389 Other specified diseases of intestine: Secondary | ICD-10-CM | POA: Diagnosis not present

## 2021-08-26 DIAGNOSIS — I2510 Atherosclerotic heart disease of native coronary artery without angina pectoris: Secondary | ICD-10-CM | POA: Insufficient documentation

## 2021-08-26 DIAGNOSIS — I5022 Chronic systolic (congestive) heart failure: Secondary | ICD-10-CM | POA: Insufficient documentation

## 2021-08-26 DIAGNOSIS — I69354 Hemiplegia and hemiparesis following cerebral infarction affecting left non-dominant side: Secondary | ICD-10-CM

## 2021-08-26 DIAGNOSIS — R42 Dizziness and giddiness: Secondary | ICD-10-CM | POA: Insufficient documentation

## 2021-08-26 DIAGNOSIS — Z881 Allergy status to other antibiotic agents status: Secondary | ICD-10-CM

## 2021-08-26 DIAGNOSIS — Z91148 Patient's other noncompliance with medication regimen for other reason: Secondary | ICD-10-CM

## 2021-08-26 DIAGNOSIS — Z9181 History of falling: Secondary | ICD-10-CM | POA: Insufficient documentation

## 2021-08-26 DIAGNOSIS — H02401 Unspecified ptosis of right eyelid: Secondary | ICD-10-CM | POA: Diagnosis present

## 2021-08-26 DIAGNOSIS — E7800 Pure hypercholesterolemia, unspecified: Secondary | ICD-10-CM | POA: Diagnosis present

## 2021-08-26 DIAGNOSIS — K760 Fatty (change of) liver, not elsewhere classified: Secondary | ICD-10-CM | POA: Insufficient documentation

## 2021-08-26 DIAGNOSIS — Z8249 Family history of ischemic heart disease and other diseases of the circulatory system: Secondary | ICD-10-CM | POA: Insufficient documentation

## 2021-08-26 DIAGNOSIS — I340 Nonrheumatic mitral (valve) insufficiency: Secondary | ICD-10-CM | POA: Insufficient documentation

## 2021-08-26 DIAGNOSIS — R531 Weakness: Secondary | ICD-10-CM | POA: Insufficient documentation

## 2021-08-26 DIAGNOSIS — R1900 Intra-abdominal and pelvic swelling, mass and lump, unspecified site: Secondary | ICD-10-CM | POA: Insufficient documentation

## 2021-08-26 DIAGNOSIS — Z885 Allergy status to narcotic agent status: Secondary | ICD-10-CM

## 2021-08-26 DIAGNOSIS — N179 Acute kidney failure, unspecified: Secondary | ICD-10-CM | POA: Diagnosis not present

## 2021-08-26 HISTORY — DX: Small intestinal bacterial overgrowth, unspecified: K63.8219

## 2021-08-26 LAB — URINALYSIS, ROUTINE W REFLEX MICROSCOPIC
Bilirubin Urine: NEGATIVE
Glucose, UA: >1000 mg/dL — AB
Hgb urine dipstick: NEGATIVE
Ketones, ur: NEGATIVE mg/dL
Leukocytes,Ua: NEGATIVE
Nitrite: NEGATIVE
Protein, ur: NEGATIVE mg/dL
Specific Gravity, Urine: 1.007 (ref 1.005–1.030)
pH: 5 (ref 5.0–8.0)

## 2021-08-26 LAB — CBC WITH DIFFERENTIAL/PLATELET
Abs Immature Granulocytes: 0.05 10*3/uL (ref 0.00–0.07)
Basophils Absolute: 0.1 10*3/uL (ref 0.0–0.1)
Basophils Relative: 1 %
Eosinophils Absolute: 0.1 10*3/uL (ref 0.0–0.5)
Eosinophils Relative: 2 %
HCT: 33.8 % — ABNORMAL LOW (ref 36.0–46.0)
Hemoglobin: 11.6 g/dL — ABNORMAL LOW (ref 12.0–15.0)
Immature Granulocytes: 1 %
Lymphocytes Relative: 28 %
Lymphs Abs: 1.9 10*3/uL (ref 0.7–4.0)
MCH: 29 pg (ref 26.0–34.0)
MCHC: 34.3 g/dL (ref 30.0–36.0)
MCV: 84.5 fL (ref 80.0–100.0)
Monocytes Absolute: 0.5 10*3/uL (ref 0.1–1.0)
Monocytes Relative: 8 %
Neutro Abs: 4.1 10*3/uL (ref 1.7–7.7)
Neutrophils Relative %: 60 %
Platelets: 205 10*3/uL (ref 150–400)
RBC: 4 MIL/uL (ref 3.87–5.11)
RDW: 15.2 % (ref 11.5–15.5)
WBC: 6.7 10*3/uL (ref 4.0–10.5)
nRBC: 0 % (ref 0.0–0.2)

## 2021-08-26 LAB — BASIC METABOLIC PANEL
Anion gap: 17 — ABNORMAL HIGH (ref 5–15)
Anion gap: 18 — ABNORMAL HIGH (ref 5–15)
BUN: 136 mg/dL — ABNORMAL HIGH (ref 6–20)
BUN: 134 mg/dL — ABNORMAL HIGH (ref 6–20)
CO2: 21 mmol/L — ABNORMAL LOW (ref 22–32)
CO2: 22 mmol/L (ref 22–32)
Calcium: 8.8 mg/dL — ABNORMAL LOW (ref 8.9–10.3)
Calcium: 8.5 mg/dL — ABNORMAL LOW (ref 8.9–10.3)
Chloride: 88 mmol/L — ABNORMAL LOW (ref 98–111)
Chloride: 93 mmol/L — ABNORMAL LOW (ref 98–111)
Creatinine, Ser: 2.59 mg/dL — ABNORMAL HIGH (ref 0.44–1.00)
Creatinine, Ser: 2.75 mg/dL — ABNORMAL HIGH (ref 0.44–1.00)
GFR, Estimated: 25 mL/min — ABNORMAL LOW (ref >60)
GFR, Estimated: 23 mL/min — ABNORMAL LOW (ref >60)
Glucose, Bld: 531 mg/dL (ref 70–99)
Glucose, Bld: 229 mg/dL — ABNORMAL HIGH (ref 70–99)
Potassium: 5.4 mmol/L — ABNORMAL HIGH (ref 3.5–5.1)
Potassium: 3.5 mmol/L (ref 3.5–5.1)
Sodium: 126 mmol/L — ABNORMAL LOW (ref 135–145)
Sodium: 133 mmol/L — ABNORMAL LOW (ref 135–145)

## 2021-08-26 LAB — LIPID PANEL
Cholesterol: 415 mg/dL — ABNORMAL HIGH (ref 0–200)
HDL: 30 mg/dL — ABNORMAL LOW (ref >40)
Triglycerides: 3662 mg/dL — ABNORMAL HIGH (ref <150)
VLDL: UNDETERMINED mg/dL (ref 0–40)

## 2021-08-26 LAB — CBG MONITORING, ED
Glucose-Capillary: 497 mg/dL — ABNORMAL HIGH (ref 70–99)
Glucose-Capillary: 309 mg/dL — ABNORMAL HIGH (ref 70–99)
Glucose-Capillary: 244 mg/dL — ABNORMAL HIGH (ref 70–99)

## 2021-08-26 LAB — COMPREHENSIVE METABOLIC PANEL
ALT: 80 U/L — ABNORMAL HIGH (ref 0–44)
AST: 71 U/L — ABNORMAL HIGH (ref 15–41)
Albumin: 4.3 g/dL (ref 3.5–5.0)
Alkaline Phosphatase: 68 U/L (ref 38–126)
Anion gap: 19 — ABNORMAL HIGH (ref 5–15)
BUN: 134 mg/dL — ABNORMAL HIGH (ref 6–20)
CO2: 19 mmol/L — ABNORMAL LOW (ref 22–32)
Calcium: 8.7 mg/dL — ABNORMAL LOW (ref 8.9–10.3)
Chloride: 88 mmol/L — ABNORMAL LOW (ref 98–111)
Creatinine, Ser: 2.9 mg/dL — ABNORMAL HIGH (ref 0.44–1.00)
GFR, Estimated: 22 mL/min — ABNORMAL LOW (ref >60)
Glucose, Bld: 467 mg/dL — ABNORMAL HIGH (ref 70–99)
Potassium: 4.2 mmol/L (ref 3.5–5.1)
Sodium: 126 mmol/L — ABNORMAL LOW (ref 135–145)
Total Bilirubin: 0.4 mg/dL (ref 0.3–1.2)
Total Protein: 7.9 g/dL (ref 6.5–8.1)

## 2021-08-26 LAB — BRAIN NATRIURETIC PEPTIDE
B Natriuretic Peptide: 12.9 pg/mL (ref 0.0–100.0)
B Natriuretic Peptide: 11 pg/mL (ref 0.0–100.0)

## 2021-08-26 LAB — GLUCOSE, CAPILLARY
Glucose-Capillary: 237 mg/dL — ABNORMAL HIGH (ref 70–99)
Glucose-Capillary: 222 mg/dL — ABNORMAL HIGH (ref 70–99)

## 2021-08-26 LAB — PREGNANCY, URINE: Preg Test, Ur: NEGATIVE

## 2021-08-26 MED ORDER — INSULIN ASPART 100 UNIT/ML IJ SOLN
5.0000 [IU] | Freq: Once | INTRAMUSCULAR | Status: AC
  Administered 2021-08-26: 5 [IU] via INTRAVENOUS

## 2021-08-26 MED ORDER — ENOXAPARIN SODIUM 30 MG/0.3ML IJ SOSY
30.0000 mg | PREFILLED_SYRINGE | Freq: Every day | INTRAMUSCULAR | Status: DC
  Administered 2021-08-27: 30 mg via SUBCUTANEOUS
  Filled 2021-08-26: qty 0.3

## 2021-08-26 MED ORDER — INSULIN REGULAR(HUMAN) IN NACL 100-0.9 UT/100ML-% IV SOLN
INTRAVENOUS | Status: DC
  Administered 2021-08-26: 5.5 [IU]/h via INTRAVENOUS
  Filled 2021-08-26: qty 100

## 2021-08-26 MED ORDER — SODIUM ZIRCONIUM CYCLOSILICATE 10 G PO PACK
10.0000 g | PACK | Freq: Once | ORAL | Status: AC
  Administered 2021-08-26: 10 g via ORAL
  Filled 2021-08-26: qty 1

## 2021-08-26 MED ORDER — PREGABALIN 100 MG PO CAPS
300.0000 mg | ORAL_CAPSULE | Freq: Every day | ORAL | Status: DC
  Administered 2021-08-27 – 2021-09-01 (×7): 300 mg via ORAL
  Filled 2021-08-26 (×7): qty 3

## 2021-08-26 MED ORDER — TORSEMIDE 20 MG PO TABS: ORAL_TABLET | ORAL | 6 refills | Status: DC

## 2021-08-26 MED ORDER — LACTATED RINGERS IV SOLN: INTRAVENOUS | Status: DC

## 2021-08-26 MED ORDER — DEXTROSE IN LACTATED RINGERS 5 % IV SOLN: INTRAVENOUS | Status: DC

## 2021-08-26 MED ORDER — METRONIDAZOLE 500 MG PO TABS
250.0000 mg | ORAL_TABLET | Freq: Three times a day (TID) | ORAL | Status: DC
  Administered 2021-08-27 – 2021-09-02 (×20): 250 mg via ORAL
  Filled 2021-08-26 (×20): qty 1

## 2021-08-26 MED ORDER — ENTRESTO 24-26 MG PO TABS: 1.0000 | ORAL_TABLET | Freq: Two times a day (BID) | ORAL | 3 refills | Status: DC

## 2021-08-26 MED ORDER — DOXYCYCLINE HYCLATE 100 MG PO TABS
100.0000 mg | ORAL_TABLET | Freq: Two times a day (BID) | ORAL | Status: DC
  Administered 2021-08-27 – 2021-09-02 (×14): 100 mg via ORAL
  Filled 2021-08-26 (×16): qty 1

## 2021-08-26 MED ORDER — TORSEMIDE 20 MG PO TABS
ORAL_TABLET | ORAL | 6 refills | Status: DC
Start: 2021-08-26 — End: 2021-08-26

## 2021-08-26 MED ORDER — DEXTROSE 50 % IV SOLN: 0.0000 mL | INTRAVENOUS | Status: DC | PRN

## 2021-08-26 NOTE — Progress Notes (Addendum)
Patient presents to [facility] ER with abnormal labs, seen in HF clinic earlier today. AKI with Cr 2.59 (1.35 9 days ago), hyperglyemia glucose 531 with bicarb 21 and anion gap 17, K 5.4, Na 126 (in setting of severe hyperglycemia). Would hold metolazone, potassium, entersto, torsemide, with gentle IVFs 3m/hr x 10 hours, medicine to manage hyperglycemia and possible concerns for DKA. Admission last month with similar AKI that resolved with holding diuretics and gentle IVFs. I don't see a strong indication for overnight general cards fellow to consult on patient tonight as initial management would that which is stated above, would have medicine manage this patient overnight and have them touch base with HF team in AM who followed the patient closely during May admission.    JCarlyle DollyMD

## 2021-08-26 NOTE — Telephone Encounter (Addendum)
Pt aware, agreeable, and verbalized understanding Going to [facility] urgent [facility] to get evaluated   ----- Message from Consuelo Pandy, PA-C sent at 08/26/2021  3:32 PM EDT ----- Critical labs concerning for DKA. Notify pt to go to ED for treatment.

## 2021-08-26 NOTE — ED Notes (Signed)
Called [facility] to transport patient to [facility] rm[facility]

## 2021-08-26 NOTE — H&P (Signed)
History and Physical    Patient: Jennifer [name] WEX:[id number] DOB: [birth year] DOA: 08/26/2021 DOS: the patient was seen and examined on 08/27/2021 PCP: Sue Lush, PA-C  Patient coming from: [place]  Chief Complaint:  Chief Complaint  Patient presents with   Abnormal Lab   HPI: Jennifer [name] is a 31 y.o. female with medical history significant of astrocytoma s/p resection and chemotherapy, hyper triglyceridemia, diastolic CHF, SIBO, CKD stage IIIa, insulin-dependent type 2 diabetes who presents at the request of cardiology with abnormal labs showing worsening renal function, hyperglycemia, and hyponatremia.   She was just hospitalized from 5/19-5/23 hyperkalemia and AKI.  Her creatinine improved from 3.59-1.85 after holding diuretics.  Her diuretics were resumed at discharge including Torsemide and metolazone.  She followed up with cardiology today and had lab work with plans to resume Entresto, increase Torsemide and discontinue her spirolactone.   For her diabetes, she is on U500 260 TID with meals and 100U qHS  but felt her insulin regimen has not been working and dexcom has been reading high for the past few days.  Reports being careful with her diet and only doing home-cooked meals.  She also mentions that about 2 days ago noticed sudden worsened swelling of her right LE with pain to her shin.  No recent travels or personal history of DVTs.  In the ED, she was afebrile tachycardic up to 108 and normotensive on room air.  No leukocytosis with mild anemia at baseline of 11.2.  Has hyponatremia of 126 with glucose of 531.  Normal pH and anion gap of 19.  Creatinine of 2.59 up from prior of 1.35.  Triglycerides have also increased to 3662 from 980 back in March.  She was started on low-dose IV fluids due to her CHF and was started on insulin infusion and was transferred here to [facility].   Review of Systems: As mentioned in the history of present  illness. All other systems reviewed and are negative. Past Medical History:  Diagnosis Date   Brain tumor ([place]) 03/29/1995   astrocytoma   CHF (congestive heart failure) (HCC)    Cholesterosis    DM (diabetes mellitus) ([place]) 10/10/2018   Fatty liver    HTN (hypertension) 10/10/2018   Hypothyroidism 10/10/2018   Lipoprotein deficiency    Lung disease    longevity long term   Pancreatitis    Polycystic ovary syndrome    Past Surgical History:  Procedure Laterality Date   ABDOMINAL SURGERY     pt states during miscarriage got her intestine   BRAIN SURGERY     EYE MUSCLE SURGERY Right 03/28/2014   RIGHT HEART CATH N/A 08/05/2021   Procedure: RIGHT HEART CATH;  Surgeon: [name], MD;  Location: [facility];  Service: Cardiovascular;  Laterality: N/A;   RIGHT/LEFT HEART CATH AND CORONARY ANGIOGRAPHY N/A 12/03/2020   Procedure: RIGHT/LEFT HEART CATH AND CORONARY ANGIOGRAPHY;  Surgeon: [name], MD;  Location: [facility];  Service: Cardiovascular;  Laterality: N/A;   VENTRICULOSTOMY  03/28/1997   Social History:  reports that she has never smoked. She has never used smokeless tobacco. She reports that she does not drink alcohol and does not use drugs.  Allergies  Allergen Reactions   Ketamine Other (See Comments)    In vegetative state for 15 minutes per pt   [place] Clam ([place]) Skin Test Itching, Other (See Comments) and Rash   Clarithromycin Other (See Comments)  Stomach pain    Fentanyl Nausea And Vomiting   [place] Mushroom [[place]] Itching    Itchy throat   Penicillin V Itching    Gi upset   Shellfish Allergy Swelling and Other (See Comments)    Throat swelling. Pt has never had shellfish but assumes it will cause swelling. Pt states contrast in CT is okay   Morphine Itching and Rash   Penicillins Rash    Has patient had a PCN reaction causing immediate rash, facial/tongue/throat swelling, SOB or lightheadedness with  hypotension: Y Has patient had a PCN reaction causing severe rash involving mucus membranes or skin necrosis: Y Has patient had a PCN reaction that required hospitalization: N Has patient had a PCN reaction occurring within the last 10 years: Y If all of the above answers are "NO", then may proceed with Cephalosporin use.    Prednisone Rash    Family History  Problem Relation Age of Onset   Diabetes Mother    Hypertension Mother    Hyperlipidemia Mother    Thyroid disease Mother    Hypertension Father    Diabetes Father    Pancreatic cancer Paternal Aunt    Pancreatic cancer Paternal Uncle    Colon cancer Neg Hx    Esophageal cancer Neg Hx    Stomach cancer Neg Hx     Prior to Admission medications   Medication Sig Start Date End Date Taking? Authorizing Provider  albuterol (VENTOLIN HFA) 108 (90 Base) MCG/ACT inhaler Inhale 1 puff into the lungs every 6 (six) hours as needed for wheezing or shortness of breath. 12/31/18   [provider]  aspirin EC 81 MG EC tablet Take 1 tablet (81 mg total) by mouth daily. Swallow whole. 12/07/20   Ezekiel Slocumb, DO  carvedilol (COREG) 6.25 MG tablet Take 1 tablet (6.25 mg total) by mouth 2 (two) times daily. 03/11/21   Shelly Coss, MD  cetirizine (ZYRTEC) 10 MG tablet Take 10 mg by mouth daily.    [provider]  dicyclomine (BENTYL) 10 MG capsule Take by mouth.    [provider]  doxycycline (VIBRA-TABS) 100 MG tablet Take 100 mg by mouth 2 (two) times daily. 08/10/21   [provider]  DULoxetine (CYMBALTA) 30 MG capsule Take 30 mg by mouth at bedtime. 01/28/19 12/16/21  [provider]  erythromycin ophthalmic ointment Place one application. into the right eye at bedtime. 03/25/20   [provider]  Evolocumab (REPATHA) 140 MG/ML SOSY Inject 140 mg into the skin See admin instructions. Inject 140 mg subcutaneously every other Sunday evening    [provider]  fenofibrate  160 MG tablet Take 1 tablet (160 mg total) by mouth daily. 12/07/20   Ezekiel Slocumb, DO  fluticasone (FLONASE) 50 MCG/ACT nasal spray Place 1 spray into both nostrils daily.    [provider]  ibuprofen (ADVIL) 800 MG tablet Take 800 mg by mouth 3 (three) times daily. 08/25/21   [provider]  insulin regular human CONCENTRATED (HUMULIN R) 500 UNIT/ML injection Inject 260 Units into the skin See admin instructions. 260 units 3 times daily with each meal, 100 units at bedtime    [provider]  ivabradine (CORLANOR) 7.5 MG TABS tablet Take 1 tablet (7.5 mg total) by mouth 2 (two) times daily with a meal. 08/02/21   [name], Shaune Pascal, MD  JENCYCLA 0.35 MG tablet Take 1 tablet by mouth daily. 07/24/21   [provider]  levothyroxine (SYNTHROID) 125 MCG  tablet Take 1 tablet (125 mcg total) by mouth daily at 6 (six) AM. 12/07/20   Ezekiel Slocumb, DO  lipase/protease/amylase (CREON) 36000 UNITS CPEP capsule Take 10,626-94,854 Units by mouth See admin instructions. 72,000 units with each meal, 36,000 units with snacks    [provider]  metolazone (ZAROXOLYN) 2.5 MG tablet Take 1 tablet (2.5 mg total) by mouth once a week for 10 doses. 08/22/21 10/25/21  Leanor Kail, PA  metroNIDAZOLE (FLAGYL) 250 MG tablet Take 250 mg by mouth 3 (three) times daily. 08/10/21   [provider]  Multiple Vitamin (MULTIVITAMIN WITH MINERALS) TABS tablet Take 1 tablet by mouth daily. 12/07/20   Ezekiel Slocumb, DO  nitroGLYCERIN (NITROSTAT) 0.4 MG SL tablet Place 1 tablet (0.4 mg total) under the tongue every 5 (five) minutes as needed for up to 25 days for chest pain. 06/16/21 08/03/22  [name], Shaune Pascal, MD  ofloxacin (OCUFLOX) 0.3 % ophthalmic solution Place 1 drop into both eyes in the morning, at noon, in the evening, and at bedtime. 09/02/20   [provider]  ondansetron (ZOFRAN) 4 MG tablet Take 4 mg by mouth every 8 (eight) hours as needed for  nausea or vomiting. 12/19/18   [provider]  pantoprazole (PROTONIX) 40 MG tablet Take 40 mg by mouth daily. 12/24/19   [provider]  potassium chloride (KLOR-CON M) 20 MEQ tablet Take 2 tablets (40 mEq total) by mouth once a week for 10 doses. 08/17/21 10/20/21  Darliss Cheney, MD  pregabalin (LYRICA) 100 MG capsule Take 1-2 capsules (100-200 mg total) by mouth See admin instructions. 200 mg in the morning, 100 mg in the afternnon Patient taking differently: Take 100-200 mg by mouth See admin instructions. 200 mg in the morning, 100 mg in the afternnon Also has 349m rx 08/05/21   [name], DShaune Pascal MD  pregabalin (LYRICA) 300 MG capsule Take 300 mg by mouth at bedtime.    [provider]  prochlorperazine (COMPAZINE) 10 MG tablet Take 1 tablet (10 mg total) by mouth every 6 (six) hours as needed for nausea or vomiting. 01/18/21   Molpus, John, MD  promethazine (PHENERGAN) 25 MG tablet Take 25 mg by mouth every 6 (six) hours as needed for vomiting or nausea. 03/26/21   [provider]  rosuvastatin (CRESTOR) 40 MG tablet Take 40 mg by mouth daily. 08/09/21   [provider]  sacubitril-valsartan (ENTRESTO) 24-26 MG Take 1 tablet by mouth 2 (two) times daily. 08/26/21   SLyda JesterM, PA-C  torsemide (DEMADEX) 20 MG tablet Take 3 tablets (60 mg total) by mouth every morning AND 1 tablet (20 mg total) every evening. 08/26/21   SLyda JesterM, PA-C  tretinoin (RETIN-A) 0.05 % cream Apply 1 application topically at bedtime. 01/17/21   [provider]    Physical Exam: Vitals:   08/26/21 2152 08/26/21 2200 08/27/21 0004 08/27/21 0007  BP: 133/82 133/82 115/62 115/62  Pulse: (!) 108 (!) 105 91 91  Resp: 18 18 19 19   Temp: 98.2 F (36.8 C) 98.2 F (36.8 C) 98.2 F (36.8 C) 98.2 F (36.8 C)  TempSrc: Oral  Oral   SpO2: 100%  98%   Weight:      Height:       Constitutional: NAD, calm, comfortable, young female laying upright in  bed Eyes: Right eye with ptosis. ENMT: Mucous membranes are moist.  Neck: normal, supple, Respiratory: clear to auscultation bilaterally, no wheezing, no crackles. Normal respiratory effort.  No accessory muscle use.  Cardiovascular: Regular rate and rhythm, no murmurs / rubs / gallops. +2 nonpitting edema of right LE. 2+ pedal pulses. Abdomen: soft, moderate distended, no tenderness, Bowel sounds positive.  Musculoskeletal: no clubbing / cyanosis. No joint deformity upper and lower extremities. Good ROM, no contractures. Normal muscle tone.  Skin: no rashes, lesions, ulcers. No induration Neurologic: CN 2-12 grossly intact. Strength 5/5 in all 4.  Psychiatric: Normal judgment and insight. Alert and oriented x 3. Normal mood. Data Reviewed:  See HPI  Assessment and Plan: * Hyperosmolar hyperglycemic state (HHS) ([place]) - continue insulin gtt with goal of 140-180 and AG <12 - IV NS until BG <250, then switch to D5 1/2 NS. Keep on IV 50cc/hr due to high potential for decompensated CHF with fluid overload - BMP q4hr  - keep NPO  -Home regimen on U500- 260 TID with meals and 100U qHS. Follows with [facility].  Acute renal failure superimposed on stage 3a chronic kidney disease ([place]) Had recent hospitalization for the same and improved after holding diuretics.  At baseline creatinine around 1.3-1.5.  Creatinine at 2.59 on admit. -Continue to hold on all diuretics while on IV fluid - Avoid nephrotoxic agent  Lower extremity edema Recent worsened of chronic right LE. Check venous doppler ultrasound to rule out DVT.  Small intestinal bacterial overgrowth (SIBO) Has been following with [facility] and continues to have some distention.  Continue oral doxycycline and metronidazole.  History of astrocytoma of brain S/p resection  Hypertriglyceridemia 3662 on presentation up from 980 back in March.  Asymptomatic from an abdominal standpoint. -on insulin infusion.  Repeat lipid panel in the  morning. -Continue lovastatin, fenofibrate.  On [place] outpatient.  Essential hypertension Hold Entresto with worsening AKI   Chronic systolic CHF (congestive heart failure) (HCC) Appears euvolemic on exam.  Currently holding torsemide and metolazone due to AKI and hyperglycemia.  Monitor closely while on IV continuous fluid. -Last EF on 5/21 with EF of 45 to 50% with hypokinesis of the inferior and inferior septal wall.  Hypothyroidism Continue levothyroxine      Advance Care Planning:   Code Status: Full Code   Consults: none  Family Communication: none at bedside  Severity of Illness: The appropriate patient status for this patient is INPATIENT. Inpatient status is judged to be reasonable and necessary in order to provide the required intensity of service to ensure the patient's safety. The patient's presenting symptoms, physical exam findings, and initial radiographic and laboratory data in the context of their chronic comorbidities is felt to place them at high risk for further clinical deterioration. Furthermore, it is not anticipated that the patient will be medically stable for discharge from the hospital within 2 midnights of admission.   * I certify that at the point of admission it is my clinical judgment that the patient will require inpatient hospital care spanning beyond 2 midnights from the point of admission due to high intensity of service, high risk for further deterioration and high frequency of surveillance required.*  Author: Orene Desanctis, DO 08/27/2021 1:11 AM  For on call review [email or web address].

## 2021-08-26 NOTE — ED Provider Notes (Signed)
[facility] EMERGENCY DEPT Provider Note   CSN: [id number] Arrival date & time: 08/26/21  1620     History  Chief Complaint  Patient presents with   Abnormal Lab    Jennifer [name] is a 31 y.o. female.  HPI    31 y.o. 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   Had been taking medications incorrectly per Cardiology note-was taking spironolactone and entresto, skipped metolazone.  Reports she did take the metolazone this Monday.  Reports her mouth feels dry and she feels thirsty, however also noticed that she has developed worsening orthopnea over the last 2 days, and worsening abdominal distention.   "Follow up 08/02/21, weight up 16 lbs, ReDs 27%. + abdominal swelling and bloating and remained tachycardic. GI work up without explanation for symptoms. Arranged for RHC to better assess hemodynamics and see if abdominal symptoms related to right-sided HF due to high-output state or non-cardiac (see results below).    RWoods5/23 with elevated filling pressures and normal cardiac ouput. Mild to moderate mixed pulmonary hypertension with prominent v-waves in RA, suggestive of significant TR. Given IV lasix and metolazone 2.5 added weekly to diuretic regimen. "  Past Medical History:  Diagnosis Date   [place]   astrocytoma   [place]estive heart [place]    Cholesterosis    [place]abetes [place]10[place]   Fatty liver    HTN (hypertension) 10/10/2018   Hypothyroidism 10/10/2018   Lipoprotein deficiency     Lung disease    longevity long term   Pancreatitis    Polycystic ovary syndrome      Home Medications Prior to Admission medications   Medication Sig Start Date End Date Taking? Authorizing Provider  albuterol (VENTOLIN HFA) 108 (90 Base) MCG/ACT inhaler Inhale 1 puff into the lungs every 6 (six) hours as needed for wheezing or shortness of breath. 12/31/18   [provider]  aspirin EC 81 MG EC tablet Take 1 tablet (81 mg total) by mouth daily. Swallow whole. 12/07/20   GEzekiel Slocumb DO  carvedilol (COREG) 6.25 MG tablet Take 1 tablet (6.25 mg total) by mouth 2 (two) times daily. 03/11/21   AShelly Coss MD  cetirizine (ZYRTEC) 10 MG tablet Take 10 mg by mouth daily.    [provider]  dicyclomine (BENTYL) 10 MG capsule Take by mouth.    [provider]  doxycycline (VIBRA-TABS) 100 MG tablet Take 100 mg by mouth 2 (two) times daily. 08/10/21   [provider]  DULoxetine (CYMBALTA) 30 MG capsule Take 30 mg by mouth at bedtime. 01/28/19 12/16/21  [provider]  erythromycin ophthalmic ointment Place one application. into the right eye at bedtime. 03/25/20   [provider]  Evolocumab (REPATHA) 140 MG/ML SOSY Inject 140 mg into the skin See admin instructions. Inject 140 mg subcutaneously every other Sunday evening    [provider]  fenofibrate 160 MG tablet Take 1 tablet (160 mg total) by mouth daily. 12/07/20   GEzekiel Slocumb DO  fluticasone (FLONASE) 50 MCG/ACT nasal spray Place 1 spray into both nostrils  daily.    [provider]  ibuprofen (ADVIL) 800 MG tablet Take 800 mg by mouth 3 (three) times daily. 08/25/21   [provider]  insulin regular human CONCENTRATED (HUMULIN R) 500 UNIT/ML injection Inject 260 Units into the skin See admin instructions. 260 units 3 times daily with each meal, 100 units at bedtime    [provider]  ivabradine (CORLANOR) 7.5 MG TABS tablet Take 1 tablet  (7.5 mg total) by mouth 2 (two) times daily with a meal. 08/02/21   [name], Shaune Pascal, MD  JENCYCLA 0.35 MG tablet Take 1 tablet by mouth daily. 07/24/21   [provider]  levothyroxine (SYNTHROID) 125 MCG tablet Take 1 tablet (125 mcg total) by mouth daily at 6 (six) AM. 12/07/20   Ezekiel Slocumb, DO  lipase/protease/amylase (CREON) 36000 UNITS CPEP capsule Take 57,846-96,295 Units by mouth See admin instructions. 72,000 units with each meal, 36,000 units with snacks    [provider]  metolazone (ZAROXOLYN) 2.5 MG tablet Take 1 tablet (2.5 mg total) by mouth once a week for 10 doses. 08/22/21 10/25/21  Leanor Kail, PA  metroNIDAZOLE (FLAGYL) 250 MG tablet Take 250 mg by mouth 3 (three) times daily. 08/10/21   [provider]  Multiple Vitamin (MULTIVITAMIN WITH MINERALS) TABS tablet Take 1 tablet by mouth daily. 12/07/20   Ezekiel Slocumb, DO  nitroGLYCERIN (NITROSTAT) 0.4 MG SL tablet Place 1 tablet (0.4 mg total) under the tongue every 5 (five) minutes as needed for up to 25 days for chest pain. 06/16/21 08/03/22  [name], Shaune Pascal, MD  ofloxacin (OCUFLOX) 0.3 % ophthalmic solution Place 1 drop into both eyes in the morning, at noon, in the evening, and at bedtime. 09/02/20   [provider]  ondansetron (ZOFRAN) 4 MG tablet Take 4 mg by mouth every 8 (eight) hours as needed for nausea or vomiting. 12/19/18   [provider]  pantoprazole (PROTONIX) 40 MG tablet Take 40 mg by mouth daily. 12/24/19   [provider]  potassium chloride (KLOR-CON M) 20 MEQ tablet Take 2 tablets (40 mEq total) by mouth once a week for 10 doses. 08/17/21 10/20/21  Darliss Cheney, MD  pregabalin (LYRICA) 100 MG capsule Take 1-2 capsules (100-200 mg total) by mouth See admin instructions. 200 mg in the morning, 100 mg in the afternnon Patient taking differently: Take 100-200 mg by mouth See admin instructions. 200 mg in the morning, 100 mg in the afternnon Also has  353m rx 08/05/21   [name], DShaune Pascal MD  pregabalin (LYRICA) 300 MG capsule Take 300 mg by mouth at bedtime.    [provider]  prochlorperazine (COMPAZINE) 10 MG tablet Take 1 tablet (10 mg total) by mouth every 6 (six) hours as needed for nausea or vomiting. 01/18/21   Molpus, John, MD  promethazine (PHENERGAN) 25 MG tablet Take 25 mg by mouth every 6 (six) hours as needed for vomiting or nausea. 03/26/21   [provider]  rosuvastatin (CRESTOR) 40 MG tablet Take 40 mg by mouth daily. 08/09/21   [provider]  sacubitril-valsartan (ENTRESTO) 24-26 MG Take 1 tablet by mouth 2 (two) times daily. 08/26/21   SLyda JesterM, PA-C  torsemide (DEMADEX) 20 MG tablet Take 3 tablets (60 mg total) by mouth every morning AND 1 tablet (20 mg total) every evening. 08/26/21   SLyda JesterM, PA-C  tretinoin (RETIN-A) 0.05 % cream Apply 1 application topically at bedtime. 01/17/21   [provider]  Allergies    Ketamine, [place] clam (m. mercenaria) skin test, Clarithromycin, Fentanyl, [place] mushroom [maitake], Penicillin v, Shellfish allergy, Morphine, Penicillins, and Prednisone    Review of Systems   Review of Systems  Physical Exam Updated Vital Signs BP 133/82   Pulse (!) 105   Temp 98.2 F (36.8 C)   Resp 18   Ht 4' 9"  (1.448 m)   Wt 63.5 kg   SpO2 100%   BMI 30.30 kg/m  Physical Exam Vitals and nursing note reviewed.  Constitutional:      General: [name] not in acute distress.    Appearance: [name] well-developed. [name] not diaphoretic.  HENT:     Head: Normocephalic and atraumatic.  Eyes:     Conjunctiva/sclera: Conjunctivae normal.  Cardiovascular:     Rate and Rhythm: Normal rate and regular rhythm.     Heart sounds: Normal heart sounds. No murmur heard.   No friction rub. No gallop.  Pulmonary:     Effort: Pulmonary effort is normal. No respiratory distress.     Breath sounds: Normal breath sounds. No wheezing  or rales.  Abdominal:     General: There is distension.     Palpations: Abdomen is soft.     Tenderness: There is no abdominal tenderness. There is no guarding.  Musculoskeletal:        General: No tenderness.     Cervical back: Normal range of motion.     Right lower leg: Right lower leg edema: trace bilateral.  Skin:    General: Skin is warm and dry.     Findings: No erythema or rash.  Neurological:     Mental Status: [name] alert and oriented to person, place, and time.    ED Results / Procedures / Treatments   Labs (all labs ordered are listed, but only abnormal results are displayed) Labs Reviewed  CBC WITH DIFFERENTIAL/PLATELET - Abnormal; Notable for the following components:      Result Value   Hemoglobin 11.6 (*)    HCT 33.8 (*)    All other components within normal limits  COMPREHENSIVE METABOLIC PANEL - Abnormal; Notable for the following components:   Sodium 126 (*)    Chloride 88 (*)    CO2 19 (*)    Glucose, Bld 467 (*)    BUN 134 (*)    Creatinine, Ser 2.90 (*)    Calcium 8.7 (*)    AST 71 (*)    ALT 80 (*)    GFR, Estimated 22 (*)    Anion gap 19 (*)    All other components within normal limits  URINALYSIS, ROUTINE W REFLEX MICROSCOPIC - Abnormal; Notable for the following components:   Glucose, UA >1,000 (*)    Bacteria, UA RARE (*)    All other components within normal limits  LIPID PANEL - Abnormal; Notable for the following components:   Cholesterol 415 (*)    Triglycerides 3,662 (*)    HDL 30 (*)    All other components within normal limits  BASIC METABOLIC PANEL - Abnormal; Notable for the following components:   Sodium 133 (*)    Chloride 93 (*)    Glucose, Bld 229 (*)    BUN 134 (*)    Creatinine, Ser 2.75 (*)    Calcium 8.5 (*)    GFR, Estimated 23 (*)    Anion gap 18 (*)    All other components within normal limits  GLUCOSE, CAPILLARY - Abnormal; Notable for  the following components:   Glucose-Capillary 237 (*)    All other  components within normal limits  GLUCOSE, CAPILLARY - Abnormal; Notable for the following components:   Glucose-Capillary 222 (*)    All other components within normal limits  CBG MONITORING, ED - Abnormal; Notable for the following components:   Glucose-Capillary 497 (*)    All other components within normal limits  CBG MONITORING, ED - Abnormal; Notable for the following components:   Glucose-Capillary 309 (*)    All other components within normal limits  CBG MONITORING, ED - Abnormal; Notable for the following components:   Glucose-Capillary 244 (*)    All other components within normal limits  BRAIN NATRIURETIC PEPTIDE  PREGNANCY, URINE  LIPID PANEL  BASIC METABOLIC PANEL  BASIC METABOLIC PANEL  BASIC METABOLIC PANEL    EKG None  Radiology DG Chest Portable 1 View  Result Date: 08/26/2021 CLINICAL DATA:  Hyperglycemia.  Hyperkalemia. EXAM: PORTABLE CHEST 1 VIEW COMPARISON:  08/13/2021 FINDINGS: The heart size and mediastinal contours are within normal limits. Both lungs are clear. The visualized skeletal structures are unremarkable. IMPRESSION: No active disease. Electronically Signed   By: Nelson Chimes M.D.   On: 08/26/2021 17:30    Procedures Procedures    Medications Ordered in ED Medications  insulin regular, human (MYXREDLIN) 100 units/ 100 mL infusion (2.8 Units/hr Intravenous Rate/Dose Change 08/26/21 2029)  lactated ringers infusion ( Intravenous Restarted 08/26/21 2023)  dextrose 5 % in lactated ringers infusion ( Intravenous New Bag/Given 08/26/21 2030)  dextrose 50 % solution 0-50 mL (has no administration in time range)  enoxaparin (LOVENOX) injection 30 mg (has no administration in time range)  metroNIDAZOLE (FLAGYL) tablet 250 mg (has no administration in time range)  doxycycline (VIBRA-TABS) tablet 100 mg (has no administration in time range)  pregabalin (LYRICA) capsule 300 mg (has no administration in time range)  sodium zirconium cyclosilicate (LOKELMA)  packet 10 g (10 g Oral Given 08/26/21 1726)  insulin aspart (novoLOG) injection 5 Units (5 Units Intravenous Given 08/26/21 1847)    ED Course/ Medical Decision Making/ A&P                           Medical Decision Making Amount and/or Complexity of Data Reviewed Labs: ordered. Radiology: ordered.  Risk Prescription drug management. Decision regarding hospitalization.     31 y.o. 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  Reviewed labs from cardiology office today which showed a potassium of 5.4, glucose of 531, creatinine of 2.59 from previous of 1.35, bicarb of 21, sodium of 126 also in the setting of hyperglycemia.  Has some findings of being hypovolemic on history and exam, and other findings that suggest some increased volume.  At this time, do not feel that further hydration or diuresis is appropriate, and will admit to the hospital with concern for AKI in the setting of congestive heart failure.  Given lokelma and insulin bolus for hyperkalemia and hyperglycemia.  Consulted cardiology, and spoke to Dr. BHarl Bowieand the advanced heart failure team will follow while she is in  the hospital.  Consulted Dr. Roosevelt Locks, hospitalist for admission.  We will place her on insulin drip. Labs were not able to be completed here at [facility] due to lipemia from likely severe hypertriglyceridemia.         Final Clinical Impression(s) / ED Diagnoses Final diagnoses:  AKI (acute kidney injury) ([place])  Hypertriglyceridemia  Hyperglycemia    Rx / DC Orders ED Discharge  Orders     None         Gareth Morgan, MD 08/26/21 2354

## 2021-08-26 NOTE — Progress Notes (Signed)
Received patient from [facility], acute kidney injury and on endo tool. CHG given, VS taken, endo tool in progress. Currently on 3.8 ml/hour insulin drip per admission endotool. Patient alert and oriented, denies pain. Personal belongings in rreach. Fuller Canada, RN

## 2021-08-26 NOTE — ED Notes (Signed)
Report given to the floor. 

## 2021-08-26 NOTE — ED Notes (Signed)
Charge RN notified of Paramedic inability to use EndoTool and the need for a RN to regulate the Insulin Drip.

## 2021-08-26 NOTE — ED Notes (Signed)
Report given to [facility]. 

## 2021-08-26 NOTE — Progress Notes (Addendum)
Advanced Heart Failure Clinic Note   Referring Physician: PCP: Sue Lush, PA-C PCP-Cardiologist: Dr. Haroldine Laws    HPI:  Jennifer [name] is a 31 y.o. 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   Has been followed by Dr. Einar Gip for her HF.    Admitted 11/22/20 with ab pain and diarrhea. Ab CT concerning for vasculitis (no pancreatitis). Her TG were found to be  4030. She was started on an insulin/dextrose infusion. Cardiology and Nephrology were consulted after her creatinine and LFTs continued to rise and she began developing hypotension. Echo 11/28/20 EF was found to be 20-25% with moderate RV dysfunction and mild septal flattening. Started on DBA 2.5. AHF consulted for further management. PICC placed to follow CVP and Co-Ox. BP were high and milrinone added to augment diuresis. cMRI c/w prior myocarditis vs sarcoid (see below). Mod pericardial effusion also noted. LVEF 29%. RV normal. She underwent R/LHC showing single vessel RCA occlusion, RA 1, LVEDP 17, Fick CO 5.6/CI 3.88. She was able to be weaned off inotropes and GDMT titrated.     Post hospital follow up she was drinking >2L/day fluid and eating more salty foods, volume was up, ReDs 43%, instructed to increase lasix to 20 mg bid x 3 days. She followed up with [name] with [facility] 12/17/20 for a 2nd opinion. He placed LifeVest and switched beta blocker to Toprol XL.   Called after hours service with swelling, instructed to continue lasix 20 mg bid.   Seen in ED 12/25/20 for fall. She was standing at work and became dizzy. She fell backwards and hit her head. CT neg, labs showed K 5.7. She was given 1 dose of Lokelma. She denied palpitations.   Volume overloaded at follow up 01/06/21, lasix switched to torsemide. She had a LifeVest per Cardiology at  [facility].   Echo (10/22): EF up to 45-50%, grade II DD, RV ok. LifeVest off.   Treated in ED 10/22 for pancreatitis, then admitted 11/22 for hyperglycemia, did not meet criteria for DKA.  Hospitalization c/b hyponatremia, sodium 118. Concern for volume overload. Lasix restarted but SCr increased. Nephrology consulted and restarted gentle IVF and GDMT held for a couple of days. Recommendation for Tolvaptan, however patient declined and requested discharge with outpatient follow-up her MD at [facility]. Jardiance stopped at discharge, weight 129 lbs.    Seen back in ED a few days later with increased weight and abd distention. Given IV lasix in ED and torsemide increased to 40 bid.   [facility] visit 11/22, her breathing had improved but she complained of abdominal swelling/bloating, not felt to be related to volume overload. Concern for possible gastroparesis. She was referred to GI. They recommended abdominal CT. This was completed 12/9 and was unremarkable. Ceruloplasmin level was also low, raising concern for possible Wilson's Disease. However had further w/u w/ [facility] and reports Wilson's disease was ruled out. Additional GI testing unremarkable.    Echo 12/22 EF 45%, RV normal.    Follow up 08/02/21, weight up 16 lbs, ReDs 27%. + abdominal swelling and bloating and remained tachycardic. GI work up without explanation for symptoms. Arranged for RHC to better assess hemodynamics and see if abdominal symptoms related to right-sided HF due to high-output state or non-cardiac (see results below).    [place] 5/23 with elevated filling pressures and normal cardiac ouput. Mild to  moderate mixed pulmonary hypertension with prominent v-waves in RA, suggestive of significant TR. Given IV lasix and metolazone 2.5 added weekly to diuretic regimen.    RA = 12 prominent V waves RV = 47/14 PA = 48/25 (37) PCW = 25 Fick cardiac output/index = 4.4/2.8 PVR =3.7 WU Ao sat = 99% PA sat = 62%, 62%   Seen back in the [facility]   post cath f/u. More SOB walking short distances on flat ground + increased palpitations. Was fluid overloaded w/ elevated ReDs 43% Reported compliance w/ diuretics but admitted to dietary indiscretion w/ sodium and high fluid intake. Torsemide was increased to 60 mg bid and instructed to take an extra dose of metolazone that week.     Presented to ED on 5/20 w/ complaints of generalized weakness and fatigue. BUN 130, creatinine 3.59, and mild elevation in transaminases.  BNP was 12.  Patient was treated with Lokelma, insulin, and IV calcium gluconate in the ED. CXR negative for acute cardiopulmonary process.     She was admitted and hydrated w/ IVFs. HF meds/diuretics held. SCr improved, 3.59>>3.44>>2.92>>2.02>>1.35. Hyperkalemia treated and corrected, K normalized to 4.1   Echo showed EF 45-50%, RV normal, mild-mod MR, RVSP 30. Only mild TR   She was restarted on lower dose torsemide 40 mg bid. Entresto restarted, instructed to take 49-51 mg bid. Ordered to discontinue spironolactone. Discharge wt 143 lb. There was concerns given med compliance. Was enrolled in Paramedicine.   Had visit by paramedicine yesterday. Taking meds incorrectly. Did not follow AVS instructions. Was still taking Jennifer [name]. Also taking 24-26 mg of Entresto. Skipped her weekly dose of Metolazone a few days prior. Subsequently, w/ redevelopment of abdominal distention. Reports decreased UOP w/ Torsemide. Denies resting dyspnea. + exertional dyspnea, NYHA Class II-early III. + orthopnea. She also complained of dizziness during paramedicine visit yesterday. Was advised to hold Entresto and spiro until today's visit.   Presents to clinic today for f/u. Here w/ paramedicine Jennifer [name]). Wt down 1 lb from previous d/c wt but abdomen noticeably distended. Orthostatic VS checked and negative but did feel dizzy w/ standing.    Review of Systems: [y] = yes, [ ]  = no   General: Weight gain [ ] ; Weight loss [ ] ; Anorexia [ ] ; Fatigue [ ] ; Fever  [ ] ; Chills [ ] ; Weakness [ ]   Cardiac: Chest pain/pressure [ ] ; Resting SOB [ ] ; Exertional SOB [Y ]; Orthopnea [ Y]; Pedal Edema [ ] ; Palpitations [ ] ; Syncope [ ] ; Presyncope [ ] ; Paroxysmal nocturnal dyspnea[ ]   Pulmonary: Cough [ ] ; Wheezing[ ] ; Hemoptysis[ ] ; Sputum [ ] ; Snoring [ ]   GI: Vomiting[ ] ; Dysphagia[ ] ; Melena[ ] ; Hematochezia [ ] ; Heartburn[ ] ; Abdominal pain [ ] ; Constipation [ ] ; Diarrhea [ ] ; BRBPR [ ]   GU: Hematuria[ ] ; Dysuria [ ] ; Nocturia[ ]   Vascular: Pain in legs with walking [ ] ; Pain in feet with lying flat [ ] ; Non-healing sores [ ] ; Stroke [ ] ; TIA [ ] ; Slurred speech [ ] ;  Neuro: Headaches[ ] ; Vertigo[ ] ; Seizures[ ] ; Paresthesias[ ] ;Blurred vision [ ] ; Diplopia [ ] ; Vision changes [ ]   Ortho/Skin: Arthritis [ ] ; Joint pain [ ] ; Muscle pain [ ] ; Joint swelling [ ] ; Back Pain [ ] ; Rash [ ]   Psych: Depression[ ] ; Anxiety[ ]   Heme: Bleeding problems [ ] ; Clotting disorders [ ] ; Anemia [ ]   Endocrine: Diabetes [ ] ; Thyroid dysfunction[ ]    Past Medical History:  Diagnosis Date   Brain tumor (  [place]) 03/29/1995   astrocytoma   CHF (congestive heart failure) (HCC)    Cholesterosis    DM (diabetes mellitus) ([place]) 10/10/2018   Fatty liver    HTN (hypertension) 10/10/2018   Hypothyroidism 10/10/2018   Lipoprotein deficiency    Lung disease    longevity long term   Pancreatitis    Polycystic ovary syndrome     Current Outpatient Medications  Medication Sig Dispense Refill   albuterol (VENTOLIN HFA) 108 (90 Base) MCG/ACT inhaler Inhale 1 puff into the lungs every 6 (six) hours as needed for wheezing or shortness of breath.     aspirin EC 81 MG EC tablet Take 1 tablet (81 mg total) by mouth daily. Swallow whole. 30 tablet 1   carvedilol (COREG) 6.25 MG tablet Take 1 tablet (6.25 mg total) by mouth 2 (two) times daily. 60 tablet 0   cetirizine (ZYRTEC) 10 MG tablet Take 10 mg by mouth daily.     dicyclomine (BENTYL) 10 MG capsule Take by mouth.     doxycycline  (VIBRA-TABS) 100 MG tablet Take 100 mg by mouth 2 (two) times daily.     DULoxetine (CYMBALTA) 30 MG capsule Take 30 mg by mouth at bedtime.     erythromycin ophthalmic ointment Place one application. into the right eye at bedtime.     Evolocumab (REPATHA) 140 MG/ML SOSY Inject 140 mg into the skin See admin instructions. Inject 140 mg subcutaneously every other Sunday evening     fenofibrate 160 MG tablet Take 1 tablet (160 mg total) by mouth daily. 30 tablet 1   fluticasone (FLONASE) 50 MCG/ACT nasal spray Place 1 spray into both nostrils daily.     ibuprofen (ADVIL) 800 MG tablet Take 800 mg by mouth 3 (three) times daily.     insulin regular human CONCENTRATED (HUMULIN R) 500 UNIT/ML injection Inject 260 Units into the skin See admin instructions. 260 units 3 times daily with each meal, 100 units at bedtime     ivabradine (CORLANOR) 7.5 MG TABS tablet Take 1 tablet (7.5 mg total) by mouth 2 (two) times daily with a meal. 60 tablet 6   JARDIANCE 10 MG TABS tablet Take 10 mg by mouth daily.     JENCYCLA 0.35 MG tablet Take 1 tablet by mouth daily.     levothyroxine (SYNTHROID) 125 MCG tablet Take 1 tablet (125 mcg total) by mouth daily at 6 (six) AM. 30 tablet 1   lipase/protease/amylase (CREON) 36000 UNITS CPEP capsule Take 75,883-25,498 Units by mouth See admin instructions. 72,000 units with each meal, 36,000 units with snacks     metolazone (ZAROXOLYN) 2.5 MG tablet Take 1 tablet (2.5 mg total) by mouth once a week for 10 doses. 1 tablet 0   metroNIDAZOLE (FLAGYL) 250 MG tablet Take 250 mg by mouth 3 (three) times daily.     Multiple Vitamin (MULTIVITAMIN WITH MINERALS) TABS tablet Take 1 tablet by mouth daily. 30 tablet 1   nitroGLYCERIN (NITROSTAT) 0.4 MG SL tablet Place 1 tablet (0.4 mg total) under the tongue every 5 (five) minutes as needed for up to 25 days for chest pain. 25 tablet 3   ofloxacin (OCUFLOX) 0.3 % ophthalmic solution Place 1 drop into both eyes in the morning, at noon, in  the evening, and at bedtime.     ondansetron (ZOFRAN) 4 MG tablet Take 4 mg by mouth every 8 (eight) hours as needed for nausea or vomiting.     pantoprazole (PROTONIX) 40 MG tablet Take 40 mg  by mouth daily.     potassium chloride (KLOR-CON M) 20 MEQ tablet Take 2 tablets (40 mEq total) by mouth once a week for 10 doses. 20 tablet 0   pregabalin (LYRICA) 100 MG capsule Take 1-2 capsules (100-200 mg total) by mouth See admin instructions. 200 mg in the morning, 100 mg in the afternnon (Patient taking differently: Take 100-200 mg by mouth See admin instructions. 200 mg in the morning, 100 mg in the afternnon Also has 39m rx) 120 capsule 10   pregabalin (LYRICA) 300 MG capsule Take 300 mg by mouth at bedtime.     prochlorperazine (COMPAZINE) 10 MG tablet Take 1 tablet (10 mg total) by mouth every 6 (six) hours as needed for nausea or vomiting. 20 tablet 0   promethazine (PHENERGAN) 25 MG tablet Take 25 mg by mouth every 6 (six) hours as needed for vomiting or nausea.     rosuvastatin (CRESTOR) 40 MG tablet Take 40 mg by mouth daily.     sacubitril-valsartan (ENTRESTO) 24-26 MG Take 1 tablet by mouth 2 (two) times daily. 180 tablet 3   tretinoin (RETIN-A) 0.05 % cream Apply 1 application topically at bedtime.     torsemide (DEMADEX) 20 MG tablet Take 3 tablets (60 mg total) by mouth every morning AND 1 tablet (20 mg total) every evening. 200 tablet 6   No current facility-administered medications for this encounter.    Allergies  Allergen Reactions   Ketamine Other (See Comments)    In vegetative state for 15 minutes per pt   [place] Clam (M. [place]enaria) Skin Test Itching, Other (See Comments) and Rash   Clarithromycin Other (See Comments)    Stomach pain    Fentanyl Nausea And Vomiting   M[place]ake Mushroom [Maitake] Itching    Itchy throat   Penicillin V Itching    Gi upset   Shellfish Allergy Swelling and Other (See Comments)    Throat swelling. Pt has never had shellfish but  assumes it will cause swelling. Pt states contrast in CT is okay   Morphine Itching and Rash   Penicillins Rash    Has patient had a PCN reaction causing immediate rash, facial/tongue/throat swelling, SOB or lightheadedness with hypotension: Y Has patient had a PCN reaction causing severe rash involving mucus membranes or skin necrosis: Y Has patient had a PCN reaction that required hospitalization: N Has patient had a PCN reaction occurring within the last 10 years: Y If all of the above answers are "NO", then may proceed with Cephalosporin use.    Prednisone Rash      Social History   Socioeconomic History   Marital status: Married    Spouse name: Not on file   Number of children: 0   Years of education: Not on file   Highest education level: Not on file  Occupational History   Occupation: [employer]  Tobacco Use   Smoking status: Never   Smokeless tobacco: Never  Vaping Use   Vaping Use: Never used  Substance and Sexual Activity   Alcohol use: Never   Drug use: Never   Sexual activity: Yes  Other Topics Concern   Not on file  Social History Narrative   Not on file   Social Determinants of Health   Financial Resource Strain: Medium Risk   Difficulty of Paying Living Expenses: Somewhat hard  Food Insecurity: Food Insecurity Present   Worried About Running Out of Food in the Last Year: Sometimes true   Ran Out of  Food in the Last Year: Never true  Transportation Needs: No Transportation Needs   Lack of Transportation (Medical): No   Lack of Transportation (Non-Medical): No  Physical Activity: Not on file  Stress: Not on file  Social Connections: Not on file  Intimate Partner Violence: Not on file      Family History  Problem Relation Age of Onset   Diabetes Mother    Hypertension Mother    Hyperlipidemia Mother    Thyroid disease Mother    Hypertension Father    Diabetes Father    Pancreatic cancer Paternal Aunt    Pancreatic cancer Paternal Uncle     Colon cancer Neg Hx    Esophageal cancer Neg Hx    Stomach cancer Neg Hx     Vitals:   08/26/21 1101  BP: 136/90  Pulse: (!) 104  SpO2: 98%  Weight: 64.7 kg     PHYSICAL EXAM: General:  chronically ill appearing. No respiratory difficulty HEENT: normal Neck: supple. JVD 8 cm. Carotids 2+ bilat; no bruits. No lymphadenopathy or thyromegaly appreciated. Cor: PMI nondisplaced. Regular rhythm tachy rhythm. No rubs, gallops or murmurs. Lungs: clear Abdomen: obese, soft, nontender,+distended. No hepatosplenomegaly. No bruits or masses. Good bowel sounds. Extremities: no cyanosis, clubbing, rash, edema Neuro: alert & oriented x 3, cranial nerves grossly intact. moves all 4 extremities w/o difficulty. Affect pleasant.  ECG: Not performed    ASSESSMENT & PLAN:  Chronic Systolic Heart Failure - likely NICM. - Echo (1/22): EF 35-40% Mod MR - Echo (11/28/20): EF 20-25% with moderate RV dysfunction and mild septal flattening. Mod-severe TR - Rheum serologies negative, but Rheumatoid factor mildly elevated at 15.5 - cMRI  9/22 EF 29%c/w prior myocarditis vs sarcoid. May have delayed chemo cardiotoxcity.  - R/LHC with single vessel RCA occlusion, RA 1, LVEDP 17, Fick CO 5.6/CI 3.88. - Echo (10/22): EF 45-50%, grade II DD, RV ok. - Echo 12/22: EF 45%, RV normal  - RHC 5/23 with elevated filling pressures and normal CO, RA 12 (with prominent v-waves), RV 47/14, PA 48/25 (37), PCW 25 - Echo 5/23 EF 45-50%, RV normal  - NYHA Class II-III. Fluid overloaded on exam, in setting of poor compliance w/ diuretics  - Will obtain insurance authorization for Furoscix  - Increase Torsemide to 60 mg qam/ 40 mg qpm  - needs to improve compliance w/ weekly metolazone.  Next dose tomorrow  - Restart Entresto today, 24-26 mg bid  - Intolerant to [place] due to frequent GU infections  - Off spiro w/ hyperkalemia  - Continue Coreg 6.25 mg bid - Continue Corlanor 7.14m bid  - Encouraged TED hoses - BNP +  BNP today  - May benefit from CardioMEMs placement to help w/ outpatient fluid monitoring. Start insurance approval process - Recently enrolled in [place] Hopefully will help compliance. Appreciate their assistance   - f/u in 1 week. If volume status not improved, will prescribe Furoscix pending insurance approval   2. Recent AKI/Hyperkalemia - check BMP today   3. CAD - Single vessel RCA occlusion (small co-dominant vessel) on cardiac cath 09/08. - Medical management.  - No s/s of ischemia - Continue aspirin + statin.   4.  Pulmonary hypertension - Mild to moderate on RHC 5/23. PVR 3.7 WU. - Can consider cautious trial of selective pulmonary vasodilators when better diuresed.   5. TR - Prominent v-waves in RA tracing on RHC 5/23 - Echo 5/23 shows only mild TR    6. Severe hyperTG -  Due to LPL deficiency w/ recent pancreatitis. - Continue fenofibrate + niacin  - Previously offered referral to [facility] for further management. She prefers to continue with her endocrinologist at [facility] who currently manages/follows.   7. Type 3c DM - Follows w/ endocrinology at [facility] - 2/2 chronic pancreatitis     8. Abdominal swelling/bloating - Seen at [facility] (11/20) for IBS, per chart review. - Had penumatosis intestinalis. She was started on pancreatic enzyme replacement. - EGD (10/21): normal, bx taken to assess for celiac. - CT abdomen (2/22) and RUQ US showed no obvious gallbladder stones. - HIDA (4/22): ended early due to tachypnea, but cystic duct and gallbladder appeared WNL. - CT abdomen 12/22 unremarkable  - suspect primarily fatty liver  9. Poor Med Compliance - now enrolled in paramedicine, appreciate their assistance  F/u in AAP clinic in 7 days     Guadalupe Kerekes Rosita Fire, [place] 08/26/21

## 2021-08-26 NOTE — Progress Notes (Signed)
Paramedicine Encounter   Patient ID: Jennifer [name] , female,   DOB: February 07, 1991,31 y.o.,  MRN: [id number]   Met patient in clinic today with provider.  Weight @ clinic-142 B/P-136/90 P-104 [id number]  Pt reports that she has been having increased sob since yesterday.  She did take meds this morning all but spiro and entresto.  D/s [id number]  Abd more distended.   Clarification on meds from yesterday-  Med changes- Keep low dose entresto 24-26 Keep spiro OFF Torsemide 60 am and keep 55m in afternoon.   Cardiomems is being worked up and cLocation manager  They are going to start process for furosix as well for in case future use.  [name] to take metolazone tomor.   Orthostatics- lying-120/72 Sitting-119/70 Standing-108/68  She has appointment with clinic next week, poss IV lasix in future.  Will f/u early next week.  Labs done today.   KMarylouise Stacks EMount Vernon6/03/2021

## 2021-08-26 NOTE — ED Triage Notes (Signed)
Patient reports to the ER for abnormal labs. Patient reports her PCP told her to come because her sugar was high, her potassium is high, BUN and Cr is elevated and her sodium is low.

## 2021-08-26 NOTE — Patient Instructions (Signed)
STOP Spironolactone.  Restart entresto 24/26 Twice daily  Change torsemide to 67m every morning (3 Tabs) and 20 mg in the evening (1 Tab).  Labs done today, your results will be available in MyChart, we will contact you for abnormal readings.  PLEASE WEAR COMPRESSION STOCKINGS.   Your physician recommends that you schedule a follow-up appointment as scheduled.  If you have any questions or concerns before your next appointment please send u[place]oreaa message through mBellaireor call our office at [phone]    TO LEAVE A MESSAGE FOR THE NURSE SELECT OPTION 2, PLEASE LEAVE A MESSAGE INCLUDING: YOUR NAME DATE OF BIRTH CALL BACK NUMBER REASON FOR CALL**this is important as we prioritize the call backs  YOU WILL RECEIVE A CALL BACK THE SAME DAY AS LONG AS YOU CALL BEFORE 4:00 PM  At the [facility] you and your health needs are our priority. As part of our continuing mission to provide you with exceptional heart care, we have created designated Provider Care Teams. These Care Teams include your primary Cardiologist (physician) and Advanced Practice Providers (APPs- Physician Assistants and Nurse Practitioners) who all work together to provide you with the care you need, when you need it.   You may see any of the following providers on your designated Care Team at your next follow up: Dr DGlori BickersDr DHaynes Kerns NP BLyda Jester PUtahJ[facility] CampusLMyrtle [facility] PUtahLAudry Riles PharmD   Please be sure to bring in all your medications bottles to every appointment.

## 2021-08-27 ENCOUNTER — Inpatient Hospital Stay (HOSPITAL_COMMUNITY): Payer: BC Managed Care – PPO

## 2021-08-27 DIAGNOSIS — R609 Edema, unspecified: Secondary | ICD-10-CM | POA: Diagnosis not present

## 2021-08-27 DIAGNOSIS — E1100 Type 2 diabetes mellitus with hyperosmolarity without nonketotic hyperglycemic-hyperosmolar coma (NKHHC): Secondary | ICD-10-CM | POA: Diagnosis not present

## 2021-08-27 LAB — GLUCOSE, CAPILLARY
Glucose-Capillary: 109 mg/dL — ABNORMAL HIGH (ref 70–99)
Glucose-Capillary: 110 mg/dL — ABNORMAL HIGH (ref 70–99)
Glucose-Capillary: 118 mg/dL — ABNORMAL HIGH (ref 70–99)
Glucose-Capillary: 121 mg/dL — ABNORMAL HIGH (ref 70–99)
Glucose-Capillary: 148 mg/dL — ABNORMAL HIGH (ref 70–99)
Glucose-Capillary: 162 mg/dL — ABNORMAL HIGH (ref 70–99)
Glucose-Capillary: 162 mg/dL — ABNORMAL HIGH (ref 70–99)
Glucose-Capillary: 204 mg/dL — ABNORMAL HIGH (ref 70–99)
Glucose-Capillary: 239 mg/dL — ABNORMAL HIGH (ref 70–99)
Glucose-Capillary: 252 mg/dL — ABNORMAL HIGH (ref 70–99)
Glucose-Capillary: 390 mg/dL — ABNORMAL HIGH (ref 70–99)
Glucose-Capillary: 392 mg/dL — ABNORMAL HIGH (ref 70–99)

## 2021-08-27 LAB — BASIC METABOLIC PANEL
Anion gap: 16 — ABNORMAL HIGH (ref 5–15)
Anion gap: 18 — ABNORMAL HIGH (ref 5–15)
BUN: 122 mg/dL — ABNORMAL HIGH (ref 6–20)
BUN: 116 mg/dL — ABNORMAL HIGH (ref 6–20)
CO2: 24 mmol/L (ref 22–32)
CO2: 25 mmol/L (ref 22–32)
Calcium: 8.9 mg/dL (ref 8.9–10.3)
Calcium: 9.2 mg/dL (ref 8.9–10.3)
Chloride: 95 mmol/L — ABNORMAL LOW (ref 98–111)
Chloride: 92 mmol/L — ABNORMAL LOW (ref 98–111)
Creatinine, Ser: 2.21 mg/dL — ABNORMAL HIGH (ref 0.44–1.00)
Creatinine, Ser: 2.08 mg/dL — ABNORMAL HIGH (ref 0.44–1.00)
GFR, Estimated: 30 mL/min — ABNORMAL LOW (ref >60)
GFR, Estimated: 32 mL/min — ABNORMAL LOW (ref >60)
Glucose, Bld: 109 mg/dL — ABNORMAL HIGH (ref 70–99)
Glucose, Bld: 131 mg/dL — ABNORMAL HIGH (ref 70–99)
Potassium: 3.3 mmol/L — ABNORMAL LOW (ref 3.5–5.1)
Potassium: 3.9 mmol/L (ref 3.5–5.1)
Sodium: 135 mmol/L (ref 135–145)
Sodium: 135 mmol/L (ref 135–145)

## 2021-08-27 LAB — LIPID PANEL
Cholesterol: 422 mg/dL — ABNORMAL HIGH (ref 0–200)
Triglycerides: 2432 mg/dL — ABNORMAL HIGH (ref <150)
VLDL: UNDETERMINED mg/dL (ref 0–40)

## 2021-08-27 MED ORDER — ROSUVASTATIN CALCIUM 20 MG PO TABS
40.0000 mg | ORAL_TABLET | Freq: Every day | ORAL | Status: DC
  Administered 2021-08-27 – 2021-09-02 (×7): 40 mg via ORAL
  Filled 2021-08-27 (×7): qty 2

## 2021-08-27 MED ORDER — ENOXAPARIN SODIUM 40 MG/0.4ML IJ SOSY
40.0000 mg | PREFILLED_SYRINGE | Freq: Every day | INTRAMUSCULAR | Status: DC
  Administered 2021-08-28 – 2021-09-02 (×6): 40 mg via SUBCUTANEOUS
  Filled 2021-08-27 (×6): qty 0.4

## 2021-08-27 MED ORDER — INSULIN ASPART 100 UNIT/ML IJ SOLN
0.0000 [IU] | Freq: Three times a day (TID) | INTRAMUSCULAR | Status: DC
  Administered 2021-08-27: 11 [IU] via SUBCUTANEOUS
  Administered 2021-08-27: 7 [IU] via SUBCUTANEOUS

## 2021-08-27 MED ORDER — INSULIN REGULAR HUMAN (CONC) 500 UNIT/ML ~~LOC~~ SOPN
80.0000 [IU] | PEN_INJECTOR | Freq: Three times a day (TID) | SUBCUTANEOUS | Status: DC
Start: 2021-08-27 — End: 2021-08-30
  Administered 2021-08-27: 80 [IU] via SUBCUTANEOUS
  Filled 2021-08-27: qty 3

## 2021-08-27 MED ORDER — INSULIN ASPART 100 UNIT/ML IJ SOLN
5.0000 [IU] | Freq: Once | INTRAMUSCULAR | Status: AC
  Administered 2021-08-27: 5 [IU] via SUBCUTANEOUS

## 2021-08-27 MED ORDER — INSULIN REGULAR HUMAN (CONC) 500 UNIT/ML ~~LOC~~ SOLN: 260.0000 [IU] | SUBCUTANEOUS | Status: DC

## 2021-08-27 MED ORDER — INSULIN ASPART 100 UNIT/ML IJ SOLN
0.0000 [IU] | Freq: Every day | INTRAMUSCULAR | Status: DC
  Administered 2021-08-27: 5 [IU] via SUBCUTANEOUS

## 2021-08-27 MED ORDER — LEVOTHYROXINE SODIUM 25 MCG PO TABS
125.0000 ug | ORAL_TABLET | Freq: Every day | ORAL | Status: DC
  Administered 2021-08-27 – 2021-09-02 (×7): 125 ug via ORAL
  Filled 2021-08-27 (×7): qty 1

## 2021-08-27 MED ORDER — FENOFIBRATE 160 MG PO TABS
160.0000 mg | ORAL_TABLET | Freq: Every day | ORAL | Status: DC
  Administered 2021-08-27 – 2021-09-02 (×7): 160 mg via ORAL
  Filled 2021-08-27 (×7): qty 1

## 2021-08-27 NOTE — Assessment & Plan Note (Signed)
Continue levothyroxine 

## 2021-08-27 NOTE — Assessment & Plan Note (Addendum)
5910 on presentation up from 980 back in March.  Asymptomatic from an abdominal standpoint. -on insulin infusion.  Repeat lipid panel in the morning. -Continue lovastatin, fenofibrate.  On [place] outpatient.

## 2021-08-27 NOTE — TOC Initial Note (Signed)
Transition of Care [facility]) - Initial/Assessment Note    Patient Details  Name: Jennifer [name] MRN: [id number] Date of Birth: 12/01/1990  Transition of Care [facility]) CM/SW Contact:    Verdell Carmine, RN Phone Number: 08/27/2021, 1:02 PM  Clinical Narrative:                  Patient just discharged one week ago. Presented with Hyperglycemia on insulin drip. , AKI cholesterol 422. Tryglcerides 3400. Is followed by heart failure clinic. Marland Kitchen She has a scale and follows a heart healthy diet.   No needs were identified at this time. Follow up with heart failure and PCP.  Please send a consult if you need TOC assistance.   Expected Discharge Plan: Home/Self Care Barriers to Discharge: Continued Medical Work up   Patient Goals and CMS Choice        Expected Discharge Plan and Services Expected Discharge Plan: Home/Self Care       Living arrangements for the past 2 months: Apartment                                      Prior Living Arrangements/Services Living arrangements for the past 2 months: Apartment Lives with:: Spouse Patient language and need for interpreter reviewed:: Yes        Need for Family Participation in Patient Care: Yes (Comment) Care giver support system in place?: Yes (comment)   Criminal Activity/Legal Involvement Pertinent to Current Situation/Hospitalization: No - Comment as needed  Activities of Daily Living      Permission Sought/Granted                  Emotional Assessment       Orientation: : Oriented to Self, Oriented to Place, Oriented to Situation Alcohol / Substance Use: Not Applicable Psych Involvement: No (comment)  Admission diagnosis:  Hyperosmolar hyperglycemic state (HHS) ([place]) [E11.00] Patient Active Problem List   Diagnosis Date Noted   Hyperosmolar hyperglycemic state (HHS) ([place]) 08/26/2021   Small intestinal bacterial overgrowth (SIBO) 08/26/2021   Lower extremity edema 08/26/2021   Hyperkalemia 08/14/2021    Hx of insulin dependent diabetes mellitus 08/14/2021   Acute kidney failure ([place]) 05/13/2021   Chest pain 03/10/2021   Acute on chronic combined systolic and diastolic CHF (congestive heart failure) ([place] [employer]) 02/09/2021   History of astrocytoma of brain 02/09/2021   Severe hyperglycemia due to diabetes mellitus ([place]) 01/25/2021   Diarrhea    Elevated transaminase level    Acute combined systolic and diastolic heart failure (HCC)    Nonischemic cardiomyopathy ([place])    Acute decompensated heart failure (HCC)    Elevated liver enzymes    Acute renal failure superimposed on stage 3a chronic kidney disease ([place]) 11/26/2020   Abdominal pain 68/10/8108   Chronic systolic CHF (congestive heart failure) ([place]) 11/23/2020   Essential hypertension 11/23/2020   Hypertriglyceridemia 11/23/2020   DM (diabetes mellitus) ([place]) 10/10/2018   Hypothyroidism 10/10/2018   PCP:  Sue Lush, PA-C Pharmacy:   [id number] - [place], [place] [address] [place] [address] Phone: [phone] Fax: [phone]  [id number] [address] [place] [address]Phone: [phone]: [phone]    Social Determinants of Health (SDOH) Interventions    Readmission Risk Interventions     View : No data  to display.

## 2021-08-27 NOTE — Assessment & Plan Note (Signed)
Has been following with [facility] and continues to have some distention.  Continue oral doxycycline and metronidazole.

## 2021-08-27 NOTE — Assessment & Plan Note (Signed)
S/p resection

## 2021-08-27 NOTE — Progress Notes (Signed)
Inpatient Diabetes Program Recommendations  AACE/ADA: New Consensus Statement on Inpatient Glycemic Control  Target Ranges:  Prepandial:   less than 140 mg/dL      Peak postprandial:   less than 180 mg/dL (1-2 hours)      Critically ill patients:  140 - 180 mg/dL    Latest Reference Range & Units 08/27/21 00:05 08/27/21 01:13 08/27/21 02:53 08/27/21 04:12 08/27/21 05:16 08/27/21 06:41 08/27/21 08:06  Glucose-Capillary 70 - 99 mg/dL 204 (H) 162 (H) 162 (H) 148 (H) 121 (H) 109 (H) 110 (H)    Latest Reference Range & Units 08/26/21 11:34  Glucose 70 - 99 mg/dL 531 (HH)    Latest Reference Range & Units 08/15/21 05:56  Hemoglobin A1C 4.8 - 5.6 % 7.9 (H)   Review of Glycemic Control  Diabetes history: DM2 Outpatient Diabetes medications: Humulin R U500 260 units TID with meals, Humulin R U500 100 units QHS Current orders for Inpatient glycemic control: Humulin R U500 260 units TID with meals, Humulin R U500 100 units QHS, Novolog 0-20 units TID with meals, Novolog 0-5 units QHS  NOTE: Per chart, patient sees Dr. Garrel Ridgel at [facility] for DM management and was last seen 08/09/21. Per office note on 08/09/21, "Since her glucose remains elevated, I will increase Humulin R U-500 to 260 units three times daily thirty minutes before meals and 100 units at bedtime. If the glucose remains above 300 on the new doses of Humulin R U-500, I will increase Humulin R U-500 to 270 units three times daily thirty minutes before meals and 100 units at bedtime. I will restart Jardiance 10 mg daily and hold off on metformin since the creatinine is 1.7".    Spoke with patient at bedside about diabetes and home regimen for diabetes control. Patient confirms she is followed by Dr. Garrel Ridgel at [facility] for DM management and currently taking Humulin R U500 260 units TID with meals and Humulin R U500 100 units QHS an outpatient for diabetes control. Inquired about Jardiance and patient states she is not taking it because of side effects  (UTI and yeast infections). Patient is administer insulin in stomach and top of thighs. Examined and palpated areas and no hardened areas noted.  Encouraged patient to be sure to rotate injection sites.  Patient reports taking DM medications as prescribed and reports that she increases dose of Humulin R U500 at times if glucose is running high. Patient reports that she took Humulin R U500 260 units yesterday morning and 300 units yesterday between 3-4:00pm.  Patient reports she has called Dr. Garrel Ridgel several times over past week to ask that he call but she has not gotten a return call yet. Patient reports she uses a Dexcom CGM and she reports that her current sensor is on left arm and been on for about 7 days. Patient reports that the sensor glucose and the finger stick glucose have been off here. Discussed CGMs can be off as much as 20% but sounds like values are more than 20% difference. Patient reports that she has noticed that sensor glucose over the past week has been much than finger sticks she has been doing at home as well. Encouraged patient to consider changing out Dexcom sensor to see if readings are closer to finger stick glucose. Encouraged patient to call Endocrinologist and may want to consider asking for an appointment if not able to speak with Dr. Garrel Ridgel over the phone about persistent hyperglycemia. Patient verbalized understanding of information discussed and reports no  further questions at this time related to diabetes.  Thanks, Barnie Alderman, RN, MSN, [place] Diabetes Coordinator Inpatient Diabetes Program [phone] (Team Pager from 8am to [place])

## 2021-08-27 NOTE — Progress Notes (Signed)
PROGRESS NOTE    Jennifer [name]  KPT:[id number] DOB: 1990-05-06 DOA: 08/26/2021 PCP: Sue Lush, PA-C   Brief Narrative:  Jennifer [name] is a 31 y.o. female with medical history significant of astrocytoma s/p resection and chemotherapy, hyper triglyceridemia, diastolic CHF, SIBO, CKD stage IIIa, insulin-dependent type 2 diabetes who presents at the request of cardiology office with abnormal labs showing worsening renal function, profound hyperglycemia, and hyponatremia.   Recently hospitalized from 5/19-5/23 hyperkalemia and AKI.  Her creatinine improved from 3.59-1.85 after holding diuretics.  Her diuretics were resumed at discharge including Torsemide and metolazone. She followed up with cardiology the day of admission to have lab work with plans to resume Entresto, increase Torsemide and discontinue her spirolactone. Labs were noted to be markedly abnormal (Hyperglycemia/hypercholesterolemia/elevated creatinine from basline ongoing) and sent to ED for evaluation.    Assessment & Plan:   Principal Problem:   Hyperosmolar hyperglycemic state (HHS) ([place]) Active Problems:   Acute renal failure superimposed on stage 3a chronic kidney disease (HCC)   Hypothyroidism   Chronic systolic CHF (congestive heart failure) (HCC)   Essential hypertension   Hypertriglyceridemia   History of astrocytoma of brain   Small intestinal bacterial overgrowth (SIBO)   Lower extremity edema   Anion gap metabolic acidosis Hyperosmolar hyperglycemic non ketotic state (HHNKS) ([place]) -Resolving -Discontinue all IV fluids IV insulin transition back to home regimen as tolerated -Unfortunately patient has markedly elevated home insulin requirements upwards of >900 units of insulin per day -During last hospitalization patient was controlled on less than 300 units/day with long-acting and sliding scale -We will continue to titrate insulin dosing here initially 80u 3 times daily with as needed sliding  scale -if patient is truly controlled on this regiment it clearly indicates her dietary compliance at home is poor -Advance diet as tolerated -Patient will likely need further discussion outpatient endocrinology for insulin pump versus basal insulin given her profound insulin needs, I feel the patient would respond well to insulin pump but will defer to outpatient endocrinology   Acute renal failure superimposed on stage 3a chronic kidney disease ([place]) -Recent hospitalization last month for similar episode secondary to diuretic changes -Baseline creatinine around 1.3-1.5.  -Continue to hold on all diuretics; IV fluids discontinued, increase p.o. intake as appropriate -We will likely discharge off diuretics as patient was being evaluated by cardiology in the outpatient setting for reinitiation of her home medications including Entresto as well as diuretics.   Lower extremity edema, unilateral, acute on chronic Recent worsened of chronic right LE without pain, limited range of motion or ambulatory dysfunction.  Venous doppler ultrasound to rule out DVT pending   Small intestinal bacterial overgrowth (SIBO) - Has been following with [facility] and continues to have some distention.   - Continue oral doxycycline and metronidazole per previous recommendations    Hypertriglyceridemia - 3662 on presentation up from 980 back in March.  Asymptomatic from an abdominal standpoint. -on insulin infusion.  Repeat lipid panel in the morning. -Continue lovastatin, fenofibrate.  On [employer] outpatient.   Essential hypertension Hold Entresto with worsening AKI    Chronic systolic CHF (congestive heart failure) (HCC) Appears euvolemic on exam.  Currently holding torsemide and metolazone due to AKI and hyperglycemia.  Monitor closely while on IV continuous fluid. -Last EF on 5/21 with EF of 45 to 50% with hypokinesis of the inferior and inferior septal wall.   Hypothyroidism Continue levothyroxine  History  of astrocytoma of brain S/p resection, stable  DVT prophylaxis: Lovenox  Code Status: Full Family Communication: None present  Status is: Inpatient  Dispo: The patient is from: Home              Anticipated d/c is to: Home              Anticipated d/c date is: 24 to 48 hours              Patient currently not medically stable for discharge  Consultants:  None  Procedures:  None  Antimicrobials:  None  Subjective: No acute issues or events overnight, glucose levels improving but continues to require high-dose insulin titration, denies chest pain shortness of breath nausea vomiting diarrhea constipation headache fevers or chills  Objective: Vitals:   08/26/21 2200 08/27/21 0004 08/27/21 0007 08/27/21 0326  BP: 133/82 115/62 115/62 130/81  Pulse: (!) 105 91 91 98  Resp: 18 19 19 19   Temp: 98.2 F (36.8 C) 98.2 F (36.8 C) 98.2 F (36.8 C) 97.7 F (36.5 C)  TempSrc:  Oral  Oral  SpO2:  98%  97%  Weight:      Height:        Intake/Output Summary (Last 24 hours) at 08/27/2021 0659 Last data filed at 08/27/2021 0327 Gross per 24 hour  Intake 730.6 ml  Output 1 ml  Net 729.6 ml   Filed Weights   08/26/21 1631  Weight: 63.5 kg    Examination:  General:  Pleasantly resting in bed, No acute distress. HEENT:  Atraumatic.  Sclerae nonicteric, noninjected.  Extraocular movements intact bilaterally. Neck:  Without mass or deformity.  Trachea is midline. Lungs:  Clear to auscultate bilaterally without rhonchi, wheeze, or rales. Heart:  Regular rate and rhythm.  Without murmurs, rubs, or gallops. Abdomen:  Soft, nontender, nondistended.  Without guarding or rebound. Extremities: Without cyanosis, clubbing   Data Reviewed: I have personally reviewed following labs and imaging studies  CBC: Recent Labs  Lab 08/26/21 1729  WBC 6.7  NEUTROABS 4.1  HGB 11.6*  HCT 33.8*  MCV 84.5  PLT 301   Basic Metabolic Panel: Recent Labs  Lab 08/26/21 1134 08/26/21 1729  08/26/21 2258 08/27/21 0607  NA 126* 126* 133* 135  K 5.4* 4.2 3.5 3.3*  CL 88* 88* 93* 95*  CO2 21* 19* 22 24  GLUCOSE 531* 467* 229* 109*  BUN 136* 134* 134* 122*  CREATININE 2.59* 2.90* 2.75* 2.21*  CALCIUM 8.8* 8.7* 8.5* 8.9   GFR: Estimated Creatinine Clearance: 28.6 mL/min (A) (by C-G formula based on SCr of 2.21 mg/dL (H)). Liver Function Tests: Recent Labs  Lab 08/26/21 1729  AST 71*  ALT 80*  ALKPHOS 68  BILITOT 0.4  PROT 7.9  ALBUMIN 4.3   No results for input(s): LIPASE, AMYLASE in the last 168 hours. No results for input(s): AMMONIA in the last 168 hours. Coagulation Profile: No results for input(s): INR, PROTIME in the last 168 hours. Cardiac Enzymes: No results for input(s): CKTOTAL, CKMB, CKMBINDEX, TROPONINI in the last 168 hours. BNP (last 3 results) No results for input(s): PROBNP in the last 8760 hours. HbA1C: No results for input(s): HGBA1C in the last 72 hours. CBG: Recent Labs  Lab 08/27/21 0113 08/27/21 0253 08/27/21 0412 08/27/21 0516 08/27/21 0641  GLUCAP 162* 162* 148* 121* 109*   Lipid Profile: Recent Labs    08/26/21 1134 08/27/21 0607  CHOL 415* 422*  HDL 30* PENDING  LDLCALC NOT CALCULATED PENDING  TRIG 3,662* PENDING  CHOLHDL NOT REPORTED DUE TO HIGH TRIGLYCERIDES  PENDING   Thyroid Function Tests: No results for input(s): TSH, T4TOTAL, FREET4, T3FREE, THYROIDAB in the last 72 hours. Anemia Panel: No results for input(s): VITAMINB12, FOLATE, FERRITIN, TIBC, IRON, RETICCTPCT in the last 72 hours. Sepsis Labs: No results for input(s): PROCALCITON, LATICACIDVEN in the last 168 hours.  No results found for this or any previous visit (from the past 240 hour(s)).       Radiology Studies: DG Chest Portable 1 View  Result Date: 08/26/2021 CLINICAL DATA:  Hyperglycemia.  Hyperkalemia. EXAM: PORTABLE CHEST 1 VIEW COMPARISON:  08/13/2021 FINDINGS: The heart size and mediastinal contours are within normal limits. Both lungs are  clear. The visualized skeletal structures are unremarkable. IMPRESSION: No active disease. Electronically Signed   By: Nelson Chimes M.D.   On: 08/26/2021 17:30    Scheduled Meds:  doxycycline  100 mg Oral BID   enoxaparin (LOVENOX) injection  30 mg Subcutaneous Daily   fenofibrate  160 mg Oral Daily   levothyroxine  125 mcg Oral Q0600   metroNIDAZOLE  250 mg Oral TID   pregabalin  300 mg Oral QHS   rosuvastatin  40 mg Oral Daily   Continuous Infusions:  dextrose 5% lactated ringers Stopped (08/27/21 0652)   insulin 2.8 Units/hr (08/26/21 2029)   lactated ringers 50 mL/hr at 08/27/21 0327     LOS: 1 day   Time spent: 82mn  Toniqua Melamed C Timothy Townsel, DO [facility]  If 7PM-7AM, please contact night-coverage [email or web address]  08/27/2021, 6:59 AM

## 2021-08-27 NOTE — Assessment & Plan Note (Signed)
Recent worsened of chronic right LE. Check venous doppler ultrasound to rule out DVT.

## 2021-08-27 NOTE — Assessment & Plan Note (Addendum)
-   continue insulin gtt with goal of 140-180 and AG <12 - IV NS until BG <250, then switch to D5 1/2 NS. Keep on IV 50cc/hr due to high potential for decompensated CHF with fluid overload - BMP q4hr  - keep NPO  -Home regimen on U500- 260 TID with meals and 100U qHS. Follows with [place] [facility].

## 2021-08-27 NOTE — Assessment & Plan Note (Signed)
Hold Entresto with worsening AKI

## 2021-08-27 NOTE — Progress Notes (Signed)
Right lower extremity venous duplex completed. Refer to "CV Proc" under chart review to view preliminary results.  08/27/2021 6:41 PM Kelby Aline., MHA, RVT, RDCS, RDMS

## 2021-08-27 NOTE — Assessment & Plan Note (Signed)
Appears euvolemic on exam.  Currently holding torsemide and metolazone due to AKI and hyperglycemia.  Monitor closely while on IV continuous fluid. -Last EF on 5/21 with EF of 45 to 50% with hypokinesis of the inferior and inferior septal wall.

## 2021-08-27 NOTE — Assessment & Plan Note (Signed)
Had recent hospitalization for the same and improved after holding diuretics.  At baseline creatinine around 1.3-1.5.  Creatinine at 2.59 on admit. -Continue to hold on all diuretics while on IV fluid - Avoid nephrotoxic agent

## 2021-08-28 DIAGNOSIS — E1100 Type 2 diabetes mellitus with hyperosmolarity without nonketotic hyperglycemic-hyperosmolar coma (NKHHC): Secondary | ICD-10-CM | POA: Diagnosis not present

## 2021-08-28 LAB — GLUCOSE, CAPILLARY
Glucose-Capillary: 210 mg/dL — ABNORMAL HIGH (ref 70–99)
Glucose-Capillary: 234 mg/dL — ABNORMAL HIGH (ref 70–99)
Glucose-Capillary: 269 mg/dL — ABNORMAL HIGH (ref 70–99)
Glucose-Capillary: 272 mg/dL — ABNORMAL HIGH (ref 70–99)
Glucose-Capillary: 274 mg/dL — ABNORMAL HIGH (ref 70–99)
Glucose-Capillary: 284 mg/dL — ABNORMAL HIGH (ref 70–99)
Glucose-Capillary: 290 mg/dL — ABNORMAL HIGH (ref 70–99)
Glucose-Capillary: 303 mg/dL — ABNORMAL HIGH (ref 70–99)
Glucose-Capillary: 312 mg/dL — ABNORMAL HIGH (ref 70–99)
Glucose-Capillary: 314 mg/dL — ABNORMAL HIGH (ref 70–99)
Glucose-Capillary: 317 mg/dL — ABNORMAL HIGH (ref 70–99)
Glucose-Capillary: 326 mg/dL — ABNORMAL HIGH (ref 70–99)
Glucose-Capillary: 351 mg/dL — ABNORMAL HIGH (ref 70–99)
Glucose-Capillary: 383 mg/dL — ABNORMAL HIGH (ref 70–99)

## 2021-08-28 LAB — BASIC METABOLIC PANEL
Anion gap: 16 — ABNORMAL HIGH (ref 5–15)
BUN: 107 mg/dL — ABNORMAL HIGH (ref 6–20)
CO2: 24 mmol/L (ref 22–32)
Calcium: 9 mg/dL (ref 8.9–10.3)
Chloride: 94 mmol/L — ABNORMAL LOW (ref 98–111)
Creatinine, Ser: 2.08 mg/dL — ABNORMAL HIGH (ref 0.44–1.00)
GFR, Estimated: 32 mL/min — ABNORMAL LOW (ref >60)
Glucose, Bld: 283 mg/dL — ABNORMAL HIGH (ref 70–99)
Potassium: 3.9 mmol/L (ref 3.5–5.1)
Sodium: 134 mmol/L — ABNORMAL LOW (ref 135–145)

## 2021-08-28 LAB — LIPID PANEL
Cholesterol: 407 mg/dL — ABNORMAL HIGH (ref 0–200)
LDL Cholesterol: UNDETERMINED mg/dL (ref 0–99)
Triglycerides: 4552 mg/dL — ABNORMAL HIGH (ref <150)
VLDL: UNDETERMINED mg/dL (ref 0–40)

## 2021-08-28 LAB — CBC
HCT: 31 % — ABNORMAL LOW (ref 36.0–46.0)
Hemoglobin: 11.4 g/dL — ABNORMAL LOW (ref 12.0–15.0)
MCH: 31.1 pg (ref 26.0–34.0)
MCHC: 36.8 g/dL — ABNORMAL HIGH (ref 30.0–36.0)
MCV: 84.5 fL (ref 80.0–100.0)
Platelets: 178 10*3/uL (ref 150–400)
RBC: 3.67 MIL/uL — ABNORMAL LOW (ref 3.87–5.11)
RDW: 14.9 % (ref 11.5–15.5)
WBC: 6.4 10*3/uL (ref 4.0–10.5)
nRBC: 0 % (ref 0.0–0.2)

## 2021-08-28 LAB — LDL CHOLESTEROL, DIRECT

## 2021-08-28 LAB — LIPASE, BLOOD: Lipase: 35 U/L (ref 11–51)

## 2021-08-28 MED ORDER — DEXTROSE 50 % IV SOLN: 0.0000 mL | INTRAVENOUS | Status: DC | PRN

## 2021-08-28 MED ORDER — DULOXETINE HCL 30 MG PO CPEP
30.0000 mg | ORAL_CAPSULE | Freq: Every day | ORAL | Status: DC
  Administered 2021-08-28 – 2021-09-01 (×5): 30 mg via ORAL
  Filled 2021-08-28 (×5): qty 1

## 2021-08-28 MED ORDER — OFLOXACIN 0.3 % OP SOLN: 1.0000 [drp] | Freq: Every day | OPHTHALMIC | Status: DC | PRN

## 2021-08-28 MED ORDER — INSULIN REGULAR(HUMAN) IN NACL 100-0.9 UT/100ML-% IV SOLN
INTRAVENOUS | Status: DC
  Administered 2021-08-28: 6.5 [IU]/h via INTRAVENOUS
  Administered 2021-08-28: 11.5 [IU]/h via INTRAVENOUS
  Administered 2021-08-29 (×2): 30 [IU]/h via INTRAVENOUS
  Administered 2021-08-29: 17 [IU]/h via INTRAVENOUS
  Administered 2021-08-29: 23 [IU]/h via INTRAVENOUS
  Administered 2021-08-29 – 2021-08-30 (×3): 14 [IU]/h via INTRAVENOUS
  Administered 2021-08-30: 25 [IU]/h via INTRAVENOUS
  Administered 2021-08-30: 12 [IU]/h via INTRAVENOUS
  Administered 2021-08-31: 4.2 [IU]/h via INTRAVENOUS
  Administered 2021-08-31: 30 [IU]/h via INTRAVENOUS
  Administered 2021-08-31: 18 [IU]/h via INTRAVENOUS
  Administered 2021-09-01: 6.5 [IU]/h via INTRAVENOUS
  Administered 2021-09-02: 3.6 [IU]/h via INTRAVENOUS
  Filled 2021-08-28 (×15): qty 100

## 2021-08-28 MED ORDER — HYDROCODONE-ACETAMINOPHEN 5-325 MG PO TABS
1.0000 | ORAL_TABLET | ORAL | Status: DC | PRN
  Administered 2021-08-28 – 2021-08-31 (×7): 2 via ORAL
  Administered 2021-09-01: 1 via ORAL
  Filled 2021-08-28 (×4): qty 2
  Filled 2021-08-28: qty 1
  Filled 2021-08-28 (×3): qty 2

## 2021-08-28 MED ORDER — IVABRADINE HCL 7.5 MG PO TABS
7.5000 mg | ORAL_TABLET | Freq: Two times a day (BID) | ORAL | Status: DC
  Administered 2021-08-28 – 2021-09-02 (×11): 7.5 mg via ORAL
  Filled 2021-08-28 (×12): qty 1

## 2021-08-28 MED ORDER — ASPIRIN 81 MG PO TBEC
81.0000 mg | DELAYED_RELEASE_TABLET | Freq: Every day | ORAL | Status: DC
  Administered 2021-08-28 – 2021-09-02 (×6): 81 mg via ORAL
  Filled 2021-08-28 (×6): qty 1

## 2021-08-28 MED ORDER — ONDANSETRON HCL 4 MG/2ML IJ SOLN
4.0000 mg | Freq: Four times a day (QID) | INTRAMUSCULAR | Status: DC | PRN
  Administered 2021-08-28 – 2021-09-02 (×7): 4 mg via INTRAVENOUS
  Filled 2021-08-28 (×7): qty 2

## 2021-08-28 MED ORDER — TRETINOIN 0.05 % EX CREA
1.0000 | TOPICAL_CREAM | Freq: Every day | CUTANEOUS | Status: DC
Start: 2021-08-28 — End: 2021-08-30

## 2021-08-28 MED ORDER — ERYTHROMYCIN 5 MG/GM OP OINT
1.0000 "application " | TOPICAL_OINTMENT | Freq: Every day | OPHTHALMIC | Status: DC
  Administered 2021-08-31: 1 via OPHTHALMIC
  Filled 2021-08-28: qty 3.5

## 2021-08-28 MED ORDER — TRAMADOL HCL 50 MG PO TABS: 50.0000 mg | ORAL_TABLET | Freq: Three times a day (TID) | ORAL | Status: DC | PRN

## 2021-08-28 MED ORDER — CARVEDILOL 6.25 MG PO TABS
6.2500 mg | ORAL_TABLET | Freq: Two times a day (BID) | ORAL | Status: DC
  Administered 2021-08-28 – 2021-09-02 (×11): 6.25 mg via ORAL
  Filled 2021-08-28 (×11): qty 1

## 2021-08-28 MED ORDER — ONDANSETRON HCL 4 MG PO TABS
4.0000 mg | ORAL_TABLET | Freq: Four times a day (QID) | ORAL | Status: DC | PRN
  Administered 2021-08-28: 4 mg via ORAL
  Filled 2021-08-28: qty 1

## 2021-08-28 MED ORDER — DEXTROSE IN LACTATED RINGERS 5 % IV SOLN: INTRAVENOUS | Status: DC

## 2021-08-28 MED ORDER — DICYCLOMINE HCL 10 MG PO CAPS
10.0000 mg | ORAL_CAPSULE | Freq: Every day | ORAL | Status: DC
  Administered 2021-08-28 – 2021-09-02 (×5): 10 mg via ORAL
  Filled 2021-08-28 (×6): qty 1

## 2021-08-28 NOTE — Progress Notes (Signed)
PROGRESS NOTE    Shavonda Wiedman  EXH:[id number] DOB: [birth year] DOA: 08/26/2021 PCP: Sue Lush, PA-C   Brief Narrative:  Nandi Tonnesen is a 31 y.o. female with medical history significant of astrocytoma s/p resection and chemotherapy, hyper triglyceridemia, diastolic CHF, SIBO, CKD stage IIIa, insulin-dependent type 2 diabetes who presents at the request of cardiology office with abnormal labs showing worsening renal function, profound hyperglycemia, and hyponatremia.   Recently hospitalized from 5/19-5/23 hyperkalemia and AKI.  Her creatinine improved from 3.59-1.85 after holding diuretics.  Her diuretics were resumed at discharge including Torsemide and metolazone. She followed up with cardiology the day of admission to have lab work with plans to resume Entresto, increase Torsemide and discontinue her spirolactone. Labs were noted to be markedly abnormal (Hyperglycemia/hypercholesterolemia/elevated creatinine from basline ongoing) and sent to ED for evaluation.   Assessment & Plan:   Principal Problem:   Hyperosmolar hyperglycemic state (HHS) ([place]) Active Problems:   Acute renal failure superimposed on stage 3a chronic kidney disease (HCC)   Hypothyroidism   Chronic systolic CHF (congestive heart failure) (HCC)   Essential hypertension   Hypertriglyceridemia   History of astrocytoma of brain   Small intestinal bacterial overgrowth (SIBO)   Lower extremity edema   Acute pancreatitis in the setting of hypertriglyceridemia, POA Triglyceride levels continue to increase despite insulin and home therapies Resume insulin drip Nausea/vomiting/abdominal pain treated as needed - currently able to tolerate PO - will continue diet as tolerated for now. Low threshold for NPO. Follow lipase [place] 2 score 6 (>7 concerning for severe acute dysfunction) Consider apheresis/plasma exchange should patient continue to fail IV insulin/current treatment  Anion gap metabolic  acidosis Hyperosmolar hyperglycemic non ketotic state (HHNKS) ([place]) -Resolved -Unfortunately patient has markedly elevated home insulin requirements upwards of >900 units of insulin per day -During last hospitalization patient was controlled on less than 300 units/day with long-acting and sliding scale -Once patient is able to DC from insulin drip as above we will continue to titrate insulin dosing here - start at 80u TID with sliding scale -if patient is truly controlled on this regiment it clearly indicates her dietary compliance at home is poor -Advance diet as tolerated -Patient will likely need further discussion outpatient endocrinology for insulin pump versus basal insulin given her profound insulin needs, I feel the patient would respond well to insulin pump but will defer to outpatient endocrinology   Acute renal failure superimposed on stage 3a chronic kidney disease ([place]) -Recent hospitalization last month for similar episode secondary to diuretic changes -Baseline creatinine around 1.3-1.5.  -Continue to hold on all diuretics; IV fluids discontinued, increase p.o. intake as appropriate -We will likely discharge off diuretics as patient was being evaluated by cardiology in the outpatient setting for reinitiation of her home medications including Entresto as well as diuretics.   Lower extremity edema, unilateral, acute on chronic, resolved Recent worsened of chronic right LE - DVT study negative   Small intestinal bacterial overgrowth (SIBO) - Has been following with [facility] and continues to have some distention.   - Continue oral doxycycline and metronidazole per previous recommendations    Hypertriglyceridemia - 3662 on presentation up from 980 back in March.  Asymptomatic from an abdominal standpoint. -on insulin infusion.  Repeat lipid panel in the morning. -Continue lovastatin, fenofibrate. On [place] outpatient.   Essential hypertension Hold Entresto with worsening AKI     Chronic systolic CHF (congestive heart failure) (HCC) -Continue to hold torsemide and metolazone due to AKI and hyperglycemia.   -  Monitor closely while on IV continuous fluid. -Last EF on 5/21 with EF of 45 to 50% with hypokinesis of the inferior and inferior septal wall.   Hypothyroidism Continue levothyroxine  History of astrocytoma of brain S/p resection, stable  DVT prophylaxis: Lovenox Code Status: Full Family Communication: None present  Status is: Inpatient  Dispo: The patient is from: Home              Anticipated d/c is to: Home              Anticipated d/c date is: 24 to 48 hours              Patient currently not medically stable for discharge  Consultants:  None  Procedures:  None  Antimicrobials:  None  Subjective: No acute issues or events overnight, abdominal pain and nausea somewhat worse than at intake denies chest pain shortness of breath headache fevers chills diarrhea constipation.  Objective: Vitals:   08/27/21 1948 08/28/21 0003 08/28/21 0404 08/28/21 0405  BP: 126/78 (!) 121/57 120/77 120/77  Pulse: (!) 105 (!) 109 (!) 114 (!) 110  Resp: 19 17 17 17   Temp: 97.7 F (36.5 C) 98 F (36.7 C) 97.8 F (36.6 C) 97.8 F (36.6 C)  TempSrc: Oral Oral Oral   SpO2: 99% 97% 95%   Weight:      Height:        Intake/Output Summary (Last 24 hours) at 08/28/2021 0724 Last data filed at 08/28/2021 0004 Gross per 24 hour  Intake 480 ml  Output 0 ml  Net 480 ml    Filed Weights   08/26/21 1631  Weight: 63.5 kg    Examination:  General:  Pleasantly resting in bed, No acute distress. HEENT:  Atraumatic.  Sclerae nonicteric, noninjected.  Extraocular movements intact bilaterally. Neck:  Without mass or deformity.  Trachea is midline. Lungs:  Clear to auscultate bilaterally without rhonchi, wheeze, or rales. Heart:  Regular rate and rhythm.  Without murmurs, rubs, or gallops. Abdomen:  Soft, nontender, nondistended.  Without guarding or  rebound. Extremities: Without cyanosis, clubbing   Data Reviewed: I have personally reviewed following labs and imaging studies  CBC: Recent Labs  Lab 08/26/21 1729 08/28/21 0408  WBC 6.7 6.4  NEUTROABS 4.1  --   HGB 11.6* 11.4*  HCT 33.8* 31.0*  MCV 84.5 84.5  PLT 205 096    Basic Metabolic Panel: Recent Labs  Lab 08/26/21 1729 08/26/21 2258 08/27/21 0607 08/27/21 1054 08/28/21 0408  NA 126* 133* 135 135 134*  K 4.2 3.5 3.3* 3.9 3.9  CL 88* 93* 95* 92* 94*  CO2 19* 22 24 25 24   GLUCOSE 467* 229* 109* 131* 283*  BUN 134* 134* 122* 116* 107*  CREATININE 2.90* 2.75* 2.21* 2.08* 2.08*  CALCIUM 8.7* 8.5* 8.9 9.2 9.0    GFR: Estimated Creatinine Clearance: 30.3 mL/min (A) (by C-G formula based on SCr of 2.08 mg/dL (H)). Liver Function Tests: Recent Labs  Lab 08/26/21 1729  AST 71*  ALT 80*  ALKPHOS 68  BILITOT 0.4  PROT 7.9  ALBUMIN 4.3    No results for input(s): LIPASE, AMYLASE in the last 168 hours. No results for input(s): AMMONIA in the last 168 hours. Coagulation Profile: No results for input(s): INR, PROTIME in the last 168 hours. Cardiac Enzymes: No results for input(s): CKTOTAL, CKMB, CKMBINDEX, TROPONINI in the last 168 hours. BNP (last 3 results) No results for input(s): PROBNP in the last 8760 hours. HbA1C: No results  for input(s): HGBA1C in the last 72 hours. CBG: Recent Labs  Lab 08/27/21 1539 08/27/21 2013 08/27/21 2147 08/28/21 0006 08/28/21 0408  GLUCAP 252* 390* 392* 314* 284*    Lipid Profile: Recent Labs    08/27/21 0607 08/28/21 0408  CHOL 422* 407*  HDL NOT REPORTED DUE TO HIGH TRIGLYCERIDES NOT REPORTED DUE TO HIGH TRIGLYCERIDES  LDLCALC NOT CALCULATED UNABLE TO CALCULATE IF TRIGLYCERIDE OVER 400 mg/dL  TRIG 2,432* 4,552*  CHOLHDL NOT REPORTED DUE TO HIGH TRIGLYCERIDES NOT REPORTED DUE TO HIGH TRIGLYCERIDES  LDLDIRECT  --  NOT CALCULATED    Thyroid Function Tests: No results for input(s): TSH, T4TOTAL, FREET4,  T3FREE, THYROIDAB in the last 72 hours. Anemia Panel: No results for input(s): VITAMINB12, FOLATE, FERRITIN, TIBC, IRON, RETICCTPCT in the last 72 hours. Sepsis Labs: No results for input(s): PROCALCITON, LATICACIDVEN in the last 168 hours.  No results found for this or any previous visit (from the past 240 hour(s)).       Radiology Studies: DG Chest Portable 1 View  Result Date: 08/26/2021 CLINICAL DATA:  Hyperglycemia.  Hyperkalemia. EXAM: PORTABLE CHEST 1 VIEW COMPARISON:  08/13/2021 FINDINGS: The heart size and mediastinal contours are within normal limits. Both lungs are clear. The visualized skeletal structures are unremarkable. IMPRESSION: No active disease. Electronically Signed   By: Nelson Chimes M.D.   On: 08/26/2021 17:30   VAS [place] LOWER EXTREMITY VENOUS (DVT)  Result Date: 08/28/2021  Lower Venous DVT Study Patient Name:  DASHANA GUIZAR  Date of Exam:   08/27/2021 Medical Rec #: [id number]          Accession #:    [id number] Date of Birth: [birth year]          Patient Gender: F Patient Age:   62 years Exam Location:  [facility] Procedure:      VAS [place] LOWER EXTREMITY VENOUS (DVT) Referring Phys: CHING TU --------------------------------------------------------------------------------  Indications: Edema.  Comparison Study: No prior study Performing Technologist: Maudry Mayhew MHA, RDMS, RVT, RDCS  Examination Guidelines: A complete evaluation includes B-mode imaging, spectral Doppler, color Doppler, and power Doppler as needed of all accessible portions of each vessel. Bilateral testing is considered an integral part of a complete examination. Limited examinations for reoccurring indications may be performed as noted. The reflux portion of the exam is performed with the patient in reverse Trendelenburg.  +---------+---------------+---------+-----------+----------+--------------+ RIGHT    CompressibilityPhasicitySpontaneityPropertiesThrombus Aging  +---------+---------------+---------+-----------+----------+--------------+ CFV      Full           Yes      Yes                                 +---------+---------------+---------+-----------+----------+--------------+ SFJ      Full                                                        +---------+---------------+---------+-----------+----------+--------------+ FV Prox  Full                                                        +---------+---------------+---------+-----------+----------+--------------+ FV Mid   Full                                                        +---------+---------------+---------+-----------+----------+--------------+  FV DistalFull                                                        +---------+---------------+---------+-----------+----------+--------------+ PFV      Full                                                        +---------+---------------+---------+-----------+----------+--------------+ POP      Full           Yes      Yes                                 +---------+---------------+---------+-----------+----------+--------------+ PTV      Full                                                        +---------+---------------+---------+-----------+----------+--------------+ PERO     Full                                                        +---------+---------------+---------+-----------+----------+--------------+   +----+---------------+---------+-----------+----------+--------------+ LEFTCompressibilityPhasicitySpontaneityPropertiesThrombus Aging +----+---------------+---------+-----------+----------+--------------+ CFV Full           Yes      Yes                                 +----+---------------+---------+-----------+----------+--------------+     Summary: RIGHT: - There is no evidence of deep vein thrombosis in the lower extremity.  - No cystic structure found in the popliteal fossa.   LEFT: - No evidence of common femoral vein obstruction.  *See table(s) above for measurements and observations. Electronically signed by Deitra Mayo MD on 08/28/2021 at 5:45:10 AM.    Final     Scheduled Meds:  aspirin EC  81 mg Oral Daily   carvedilol  6.25 mg Oral BID   dicyclomine  10 mg Oral Daily   doxycycline  100 mg Oral BID   DULoxetine  30 mg Oral QHS   enoxaparin (LOVENOX) injection  40 mg Subcutaneous Daily   erythromycin  1 application. Right Eye QHS   fenofibrate  160 mg Oral Daily   insulin regular human CONCENTRATED  80 Units Subcutaneous TID WC   ivabradine  7.5 mg Oral BID WC   levothyroxine  125 mcg Oral Q0600   metroNIDAZOLE  250 mg Oral TID   pregabalin  300 mg Oral QHS   rosuvastatin  40 mg Oral Daily   tretinoin  1 application. Topical QHS   Continuous Infusions:  dextrose 5% lactated ringers Stopped (08/27/21 0948)   dextrose 5% lactated ringers     insulin     lactated ringers Stopped (08/27/21 0948)     LOS: 2 days   Time  spent: 57mn  Taylyn Brame C Thaily Hackworth, DO [facility]  If 7PM-7AM, please contact night-coverage [email or web address]  08/28/2021, 7:24 AM

## 2021-08-29 DIAGNOSIS — E1100 Type 2 diabetes mellitus with hyperosmolarity without nonketotic hyperglycemic-hyperosmolar coma (NKHHC): Secondary | ICD-10-CM | POA: Diagnosis not present

## 2021-08-29 LAB — GLUCOSE, CAPILLARY
Glucose-Capillary: 125 mg/dL — ABNORMAL HIGH (ref 70–99)
Glucose-Capillary: 141 mg/dL — ABNORMAL HIGH (ref 70–99)
Glucose-Capillary: 142 mg/dL — ABNORMAL HIGH (ref 70–99)
Glucose-Capillary: 153 mg/dL — ABNORMAL HIGH (ref 70–99)
Glucose-Capillary: 155 mg/dL — ABNORMAL HIGH (ref 70–99)
Glucose-Capillary: 171 mg/dL — ABNORMAL HIGH (ref 70–99)
Glucose-Capillary: 177 mg/dL — ABNORMAL HIGH (ref 70–99)
Glucose-Capillary: 193 mg/dL — ABNORMAL HIGH (ref 70–99)
Glucose-Capillary: 205 mg/dL — ABNORMAL HIGH (ref 70–99)
Glucose-Capillary: 214 mg/dL — ABNORMAL HIGH (ref 70–99)
Glucose-Capillary: 218 mg/dL — ABNORMAL HIGH (ref 70–99)
Glucose-Capillary: 221 mg/dL — ABNORMAL HIGH (ref 70–99)
Glucose-Capillary: 226 mg/dL — ABNORMAL HIGH (ref 70–99)
Glucose-Capillary: 228 mg/dL — ABNORMAL HIGH (ref 70–99)
Glucose-Capillary: 237 mg/dL — ABNORMAL HIGH (ref 70–99)
Glucose-Capillary: 239 mg/dL — ABNORMAL HIGH (ref 70–99)
Glucose-Capillary: 242 mg/dL — ABNORMAL HIGH (ref 70–99)
Glucose-Capillary: 249 mg/dL — ABNORMAL HIGH (ref 70–99)
Glucose-Capillary: 255 mg/dL — ABNORMAL HIGH (ref 70–99)
Glucose-Capillary: 261 mg/dL — ABNORMAL HIGH (ref 70–99)
Glucose-Capillary: 281 mg/dL — ABNORMAL HIGH (ref 70–99)
Glucose-Capillary: 288 mg/dL — ABNORMAL HIGH (ref 70–99)

## 2021-08-29 LAB — CBC
HCT: 29.9 % — ABNORMAL LOW (ref 36.0–46.0)
Hemoglobin: 10.7 g/dL — ABNORMAL LOW (ref 12.0–15.0)
MCH: 30.4 pg (ref 26.0–34.0)
MCHC: 35.8 g/dL (ref 30.0–36.0)
MCV: 84.9 fL (ref 80.0–100.0)
Platelets: 168 10*3/uL (ref 150–400)
RBC: 3.52 MIL/uL — ABNORMAL LOW (ref 3.87–5.11)
RDW: 14.8 % (ref 11.5–15.5)
WBC: 6.1 10*3/uL (ref 4.0–10.5)
nRBC: 0 % (ref 0.0–0.2)

## 2021-08-29 LAB — LIPID PANEL
Cholesterol: 392 mg/dL — ABNORMAL HIGH (ref 0–200)
LDL Cholesterol: UNDETERMINED mg/dL (ref 0–99)
Triglycerides: 4257 mg/dL — ABNORMAL HIGH (ref <150)
VLDL: UNDETERMINED mg/dL (ref 0–40)

## 2021-08-29 LAB — BASIC METABOLIC PANEL
Anion gap: 14 (ref 5–15)
BUN: 82 mg/dL — ABNORMAL HIGH (ref 6–20)
CO2: 24 mmol/L (ref 22–32)
Calcium: 9.5 mg/dL (ref 8.9–10.3)
Chloride: 95 mmol/L — ABNORMAL LOW (ref 98–111)
Creatinine, Ser: 1.9 mg/dL — ABNORMAL HIGH (ref 0.44–1.00)
GFR, Estimated: 36 mL/min — ABNORMAL LOW (ref >60)
Glucose, Bld: 260 mg/dL — ABNORMAL HIGH (ref 70–99)
Potassium: 3.8 mmol/L (ref 3.5–5.1)
Sodium: 133 mmol/L — ABNORMAL LOW (ref 135–145)

## 2021-08-29 LAB — LDL CHOLESTEROL, DIRECT

## 2021-08-29 LAB — LIPASE, BLOOD: Lipase: 27 U/L (ref 11–51)

## 2021-08-29 MED ORDER — POTASSIUM CHLORIDE 2 MEQ/ML IV SOLN
INTRAVENOUS | Status: DC
  Filled 2021-08-29 (×6): qty 1000

## 2021-08-29 NOTE — Progress Notes (Signed)
PROGRESS NOTE    Jennifer [name]  OYD:[id number] DOB: [birth year] DOA: 08/26/2021 PCP: Sue Lush, PA-C   Brief Narrative:  Jennifer [name] is a 31 y.o. female with medical history significant of astrocytoma s/p resection and chemotherapy, hyper triglyceridemia, diastolic CHF, SIBO, CKD stage IIIa, insulin-dependent type 2 diabetes who presents at the request of cardiology office with abnormal labs showing worsening renal function, profound hyperglycemia, and hyponatremia.   Recently hospitalized from 5/19-5/23 hyperkalemia and AKI.  Her creatinine improved from 3.59-1.85 after holding diuretics.  Her diuretics were resumed at discharge including Torsemide and metolazone. She followed up with cardiology the day of admission to have lab work with plans to resume Entresto, increase Torsemide and discontinue her spirolactone. Labs were noted to be markedly abnormal (Hyperglycemia/hypercholesterolemia/elevated creatinine from basline ongoing) and sent to ED for evaluation.   Assessment & Plan:   Principal Problem:   Hyperosmolar hyperglycemic state (HHS) ([place]) Active Problems:   Acute renal failure superimposed on stage 3a chronic kidney disease (HCC)   Hypothyroidism   Chronic systolic CHF (congestive heart failure) (HCC)   Essential hypertension   Hypertriglyceridemia   History of astrocytoma of brain   Small intestinal bacterial overgrowth (SIBO)   Lower extremity edema   Acute pancreatitis, improving Concurrent hypertriglyceridemia, POA, stabilizing Triglyceride levels chronically elevated - baseline around [phone] per outpatient testing Triglycerides leveled off at 4,500-->4,200 today Continue insulin drip - D10+1/2NS  Nausea/vomiting/abdominal pain improving treated as needed - currently able to tolerate PO - will continue diet as tolerated for now. Low threshold for NPO. Follow lipase - reassuringly low [place] 2 score 6 (>7 concerning for severe acute  dysfunction) Discussed apheresis/plasma exchange should patient continue to fail IV insulin/current treatment with nephrology - limited use per our discussion.  Anion gap metabolic acidosis Hyperosmolar hyperglycemic non ketotic state (HHNKS) ([place]) -Resolved -Unfortunately patient has markedly elevated home insulin requirements upwards of >900 units of insulin per day -During last hospitalization patient was controlled on less than 300 units/day with long-acting and sliding scale concerning for dietary noncompliance at home -Patient will likely need further discussion outpatient endocrinology for insulin pump versus basal insulin given her profound insulin needs, I feel the patient would respond well to insulin pump but will defer to outpatient endocrinology   Acute renal failure superimposed on stage 3a chronic kidney disease ([place]) -Recent hospitalization last month for similar episode secondary to diuretic changes -Baseline creatinine around 1.3-1.5.  -Improving with PO intake -Continue to hold on all diuretics; IV fluids discontinued, increase p.o. intake as appropriate -We will likely discharge off diuretics as patient was being evaluated by cardiology in the outpatient setting for reinitiation of her home medications including Entresto as well as diuretics.   Lower extremity edema, unilateral, acute on chronic, resolved Recent worsened of chronic right LE - DVT study negative   Small intestinal bacterial overgrowth (SIBO) - Has been following with [facility] and continues to have some distention.   - Continue oral doxycycline and metronidazole per previous recommendations    Hypertriglyceridemia - 3662 on presentation up from 980 back in March.  Asymptomatic from an abdominal standpoint. -on insulin infusion.  Repeat lipid panel in the morning. -Continue lovastatin, fenofibrate. On [place] outpatient.   Essential hypertension Hold Entresto with worsening AKI    Chronic systolic CHF  (congestive heart failure) (HCC) -Continue to hold torsemide and metolazone due to AKI and hyperglycemia.   -Monitor closely while on IV continuous fluid. -Last EF on 5/21 with EF of 45 to  50% with hypokinesis of the inferior and inferior septal wall.   Hypothyroidism Continue levothyroxine  History of astrocytoma of brain S/p resection, stable  DVT prophylaxis: Lovenox Code Status: Full Family Communication: None present  Status is: Inpatient  Dispo: The patient is from: Home              Anticipated d/c is to: Home              Anticipated d/c date is: 24 to 48 hours              Patient currently not medically stable for discharge  Consultants:  None  Procedures:  None  Antimicrobials:  None  Subjective: No acute issues or events overnight, abdominal pain and nausea moderately improved over the past 24 hours  Objective: Vitals:   08/28/21 1835 08/28/21 1941 08/28/21 2319 08/29/21 0332  BP: 137/79 131/78 (!) 143/81 131/77  Pulse: 85 80 78 86  Resp: 18 (!) 21 18 18   Temp:  97.6 F (36.4 C) 97.6 F (36.4 C) 98 F (36.7 C)  TempSrc:  Oral Oral Oral  SpO2: 96% 97% 100% 95%  Weight:      Height:        Intake/Output Summary (Last 24 hours) at 08/29/2021 0718 Last data filed at 08/29/2021 0334 Gross per 24 hour  Intake 821 ml  Output 600 ml  Net 221 ml    Filed Weights   08/26/21 1631  Weight: 63.5 kg    Examination:  General:  Pleasantly resting in bed, No acute distress. HEENT:  Atraumatic.  Sclerae nonicteric, noninjected.  Extraocular movements intact bilaterally. Neck:  Without mass or deformity.  Trachea is midline. Lungs:  Clear to auscultate bilaterally without rhonchi, wheeze, or rales. Heart:  Regular rate and rhythm.  Without murmurs, rubs, or gallops. Abdomen:  Soft, nontender, nondistended.  Without guarding or rebound. Extremities: Without cyanosis, clubbing   Data Reviewed: I have personally reviewed following labs and imaging  studies  CBC: Recent Labs  Lab 08/26/21 1729 08/28/21 0408 08/29/21 0129  WBC 6.7 6.4 6.1  NEUTROABS 4.1  --   --   HGB 11.6* 11.4* 10.7*  HCT 33.8* 31.0* 29.9*  MCV 84.5 84.5 84.9  PLT 205 178 867    Basic Metabolic Panel: Recent Labs  Lab 08/26/21 2258 08/27/21 0607 08/27/21 1054 08/28/21 0408 08/29/21 0129  NA 133* 135 135 134* 133*  K 3.5 3.3* 3.9 3.9 3.8  CL 93* 95* 92* 94* 95*  CO2 22 24 25 24 24   GLUCOSE 229* 109* 131* 283* 260*  BUN 134* 122* 116* 107* 82*  CREATININE 2.75* 2.21* 2.08* 2.08* 1.90*  CALCIUM 8.5* 8.9 9.2 9.0 9.5    GFR: Estimated Creatinine Clearance: 33.2 mL/min (A) (by C-G formula based on SCr of 1.9 mg/dL (H)). Liver Function Tests: Recent Labs  Lab 08/26/21 1729  AST 71*  ALT 80*  ALKPHOS 68  BILITOT 0.4  PROT 7.9  ALBUMIN 4.3    Recent Labs  Lab 08/28/21 1156 08/29/21 0129  LIPASE 35 27   No results for input(s): AMMONIA in the last 168 hours. Coagulation Profile: No results for input(s): INR, PROTIME in the last 168 hours. Cardiac Enzymes: No results for input(s): CKTOTAL, CKMB, CKMBINDEX, TROPONINI in the last 168 hours. BNP (last 3 results) No results for input(s): PROBNP in the last 8760 hours. HbA1C: No results for input(s): HGBA1C in the last 72 hours. CBG: Recent Labs  Lab 08/29/21 0202 08/29/21 0305 08/29/21  0407 08/29/21 0514 08/29/21 0623  GLUCAP 255* 228* 214* 205* 177*    Lipid Profile: Recent Labs    08/28/21 0408 08/29/21 0129  CHOL 407* 392*  HDL NOT REPORTED DUE TO HIGH TRIGLYCERIDES NOT REPORTED DUE TO HIGH TRIGLYCERIDES  LDLCALC UNABLE TO CALCULATE IF TRIGLYCERIDE OVER 400 mg/dL UNABLE TO CALCULATE IF TRIGLYCERIDE OVER 400 mg/dL  TRIG 4,552* 4,257*  CHOLHDL NOT REPORTED DUE TO HIGH TRIGLYCERIDES NOT REPORTED DUE TO HIGH TRIGLYCERIDES  LDLDIRECT NOT CALCULATED NOT REPORTED DUE TO HIGH TRIGLYCERIDES    Thyroid Function Tests: No results for input(s): TSH, T4TOTAL, FREET4, T3FREE,  THYROIDAB in the last 72 hours. Anemia Panel: No results for input(s): VITAMINB12, FOLATE, FERRITIN, TIBC, IRON, RETICCTPCT in the last 72 hours. Sepsis Labs: No results for input(s): PROCALCITON, LATICACIDVEN in the last 168 hours.  No results found for this or any previous visit (from the past 240 hour(s)).       Radiology Studies: VAS [place] LOWER EXTREMITY VENOUS (DVT)  Result Date: 08/28/2021  Lower Venous DVT Study Patient Name:  Jennifer [name]  Date of Exam:   08/27/2021 Medical Rec #: [id number]          Accession #:    [id number] Date of Birth: [birth year]          Patient Gender: F Patient Age:   68 years Exam Location:  [facility] Procedure:      VAS [place] LOWER EXTREMITY VENOUS (DVT) Referring Phys: CHING TU --------------------------------------------------------------------------------  Indications: Edema.  Comparison Study: No prior study Performing Technologist: Maudry Mayhew MHA, RDMS, RVT, RDCS  Examination Guidelines: A complete evaluation includes B-mode imaging, spectral Doppler, color Doppler, and power Doppler as needed of all accessible portions of each vessel. Bilateral testing is considered an integral part of a complete examination. Limited examinations for reoccurring indications may be performed as noted. The reflux portion of the exam is performed with the patient in reverse Trendelenburg.  +---------+---------------+---------+-----------+----------+--------------+ RIGHT    CompressibilityPhasicitySpontaneityPropertiesThrombus Aging +---------+---------------+---------+-----------+----------+--------------+ CFV      Full           Yes      Yes                                 +---------+---------------+---------+-----------+----------+--------------+ SFJ      Full                                                        +---------+---------------+---------+-----------+----------+--------------+ FV Prox  Full                                                         +---------+---------------+---------+-----------+----------+--------------+ FV Mid   Full                                                        +---------+---------------+---------+-----------+----------+--------------+ FV DistalFull                                                        +---------+---------------+---------+-----------+----------+--------------+  PFV      Full                                                        +---------+---------------+---------+-----------+----------+--------------+ POP      Full           Yes      Yes                                 +---------+---------------+---------+-----------+----------+--------------+ PTV      Full                                                        +---------+---------------+---------+-----------+----------+--------------+ PERO     Full                                                        +---------+---------------+---------+-----------+----------+--------------+   +----+---------------+---------+-----------+----------+--------------+ LEFTCompressibilityPhasicitySpontaneityPropertiesThrombus Aging +----+---------------+---------+-----------+----------+--------------+ CFV Full           Yes      Yes                                 +----+---------------+---------+-----------+----------+--------------+     Summary: RIGHT: - There is no evidence of deep vein thrombosis in the lower extremity.  - No cystic structure found in the popliteal fossa.  LEFT: - No evidence of common femoral vein obstruction.  *See table(s) above for measurements and observations. Electronically signed by Deitra Mayo MD on 08/28/2021 at 5:45:10 AM.    Final     Scheduled Meds:  aspirin EC  81 mg Oral Daily   carvedilol  6.25 mg Oral BID   dicyclomine  10 mg Oral Daily   doxycycline  100 mg Oral BID   DULoxetine  30 mg Oral QHS   enoxaparin (LOVENOX) injection  40 mg Subcutaneous Daily    erythromycin  1 application. Right Eye QHS   fenofibrate  160 mg Oral Daily   insulin regular human CONCENTRATED  80 Units Subcutaneous TID WC   ivabradine  7.5 mg Oral BID WC   levothyroxine  125 mcg Oral Q0600   metroNIDAZOLE  250 mg Oral TID   pregabalin  300 mg Oral QHS   rosuvastatin  40 mg Oral Daily   tretinoin  1 application. Topical QHS   Continuous Infusions:  dextrose 5% lactated ringers Stopped (08/27/21 0948)   dextrose 5% lactated ringers 50 mL/hr at 08/28/21 1345   insulin 16 Units/hr (08/29/21 0700)   lactated ringers Stopped (08/27/21 0948)     LOS: 3 days   Time spent: 92mn  Kerolos Nehme C Yakelin Grenier, DO [facility]  If 7PM-7AM, please contact night-coverage [email or web address]  08/29/2021, 7:18 AM

## 2021-08-30 DIAGNOSIS — E1100 Type 2 diabetes mellitus with hyperosmolarity without nonketotic hyperglycemic-hyperosmolar coma (NKHHC): Secondary | ICD-10-CM | POA: Diagnosis not present

## 2021-08-30 LAB — LIPID PANEL
Cholesterol: 293 mg/dL — ABNORMAL HIGH (ref 0–200)
LDL Cholesterol: UNDETERMINED mg/dL (ref 0–99)
Triglycerides: 3288 mg/dL — ABNORMAL HIGH (ref <150)
VLDL: UNDETERMINED mg/dL (ref 0–40)

## 2021-08-30 LAB — GLUCOSE, CAPILLARY
Glucose-Capillary: 173 mg/dL — ABNORMAL HIGH (ref 70–99)
Glucose-Capillary: 142 mg/dL — ABNORMAL HIGH (ref 70–99)
Glucose-Capillary: 147 mg/dL — ABNORMAL HIGH (ref 70–99)
Glucose-Capillary: 132 mg/dL — ABNORMAL HIGH (ref 70–99)
Glucose-Capillary: 206 mg/dL — ABNORMAL HIGH (ref 70–99)
Glucose-Capillary: 205 mg/dL — ABNORMAL HIGH (ref 70–99)
Glucose-Capillary: 166 mg/dL — ABNORMAL HIGH (ref 70–99)
Glucose-Capillary: 157 mg/dL — ABNORMAL HIGH (ref 70–99)
Glucose-Capillary: 195 mg/dL — ABNORMAL HIGH (ref 70–99)
Glucose-Capillary: 226 mg/dL — ABNORMAL HIGH (ref 70–99)
Glucose-Capillary: 213 mg/dL — ABNORMAL HIGH (ref 70–99)
Glucose-Capillary: 203 mg/dL — ABNORMAL HIGH (ref 70–99)
Glucose-Capillary: 185 mg/dL — ABNORMAL HIGH (ref 70–99)
Glucose-Capillary: 180 mg/dL — ABNORMAL HIGH (ref 70–99)
Glucose-Capillary: 121 mg/dL — ABNORMAL HIGH (ref 70–99)
Glucose-Capillary: 145 mg/dL — ABNORMAL HIGH (ref 70–99)
Glucose-Capillary: 212 mg/dL — ABNORMAL HIGH (ref 70–99)

## 2021-08-30 LAB — CBC
HCT: 30.7 % — ABNORMAL LOW (ref 36.0–46.0)
Hemoglobin: 10.6 g/dL — ABNORMAL LOW (ref 12.0–15.0)
MCH: 30 pg (ref 26.0–34.0)
MCHC: 34.5 g/dL (ref 30.0–36.0)
MCV: 87 fL (ref 80.0–100.0)
Platelets: 148 10*3/uL — ABNORMAL LOW (ref 150–400)
RBC: 3.53 MIL/uL — ABNORMAL LOW (ref 3.87–5.11)
RDW: 14.7 % (ref 11.5–15.5)
WBC: 6.5 10*3/uL (ref 4.0–10.5)
nRBC: 0 % (ref 0.0–0.2)

## 2021-08-30 LAB — BASIC METABOLIC PANEL
Anion gap: 6 (ref 5–15)
BUN: 45 mg/dL — ABNORMAL HIGH (ref 6–20)
CO2: 24 mmol/L (ref 22–32)
Calcium: 9 mg/dL (ref 8.9–10.3)
Chloride: 105 mmol/L (ref 98–111)
Creatinine, Ser: 1.5 mg/dL — ABNORMAL HIGH (ref 0.44–1.00)
GFR, Estimated: 48 mL/min — ABNORMAL LOW (ref >60)
Glucose, Bld: 154 mg/dL — ABNORMAL HIGH (ref 70–99)
Potassium: 4.2 mmol/L (ref 3.5–5.1)
Sodium: 135 mmol/L (ref 135–145)

## 2021-08-30 LAB — LDL CHOLESTEROL, DIRECT

## 2021-08-30 LAB — LIPASE, BLOOD: Lipase: 27 U/L (ref 11–51)

## 2021-08-30 NOTE — Progress Notes (Signed)
Mobility Specialist: Progress Note   08/30/21 1255  Mobility  Activity Ambulated independently in hallway  Level of Assistance Independent  Assistive Device Other (Comment) (IV pole)  Distance Ambulated (ft) 1500 ft  Activity Response Tolerated well  $Mobility charge 1 Mobility   Received pt in bed having no complaints and agreeable to mobility. Asymptomatic throughout ambulation, returned back to bed w/ call bell in reach and all needs met.  [facility] Greta Yung Mobility Specialist Mobility Specialist 5 North: [phone] Mobility Specialist 6 North: [phone]

## 2021-08-30 NOTE — Progress Notes (Signed)
PROGRESS NOTE    Jennifer [name]  SVX:[id number] DOB: 12/28/1990 DOA: 08/26/2021 PCP: Sue Lush, PA-C   Brief Narrative:  Jennifer [name] is a 31 y.o. female with medical history significant of astrocytoma s/p resection and chemotherapy, hyper triglyceridemia, diastolic CHF, SIBO, CKD stage IIIa, insulin-dependent type 2 diabetes who presents at the request of cardiology office with abnormal labs showing worsening renal function, profound hyperglycemia, and hyponatremia.   Recently hospitalized from 5/19-5/23 hyperkalemia and AKI.  Her creatinine improved from 3.59-1.85 after holding diuretics.  Her diuretics were resumed at discharge including Torsemide and metolazone. She followed up with cardiology the day of admission to have lab work with plans to resume Entresto, increase Torsemide and discontinue her spirolactone. Labs were noted to be markedly abnormal (Hyperglycemia/hypercholesterolemia/elevated creatinine from basline ongoing) and sent to ED for evaluation.   Assessment & Plan:   Principal Problem:   Hyperosmolar hyperglycemic state (HHS) ([place]) Active Problems:   Acute renal failure superimposed on stage 3a chronic kidney disease (HCC)   Hypothyroidism   Chronic systolic CHF (congestive heart failure) (HCC)   Essential hypertension   Hypertriglyceridemia   History of astrocytoma of brain   Small intestinal bacterial overgrowth (SIBO)   Lower extremity edema   Acute pancreatitis, improving Concurrent hypertriglyceridemia, POA, stabilizing Triglyceride levels chronically elevated - baseline around [phone] per outpatient testing Triglycerides downtrending  4,500-->3,200 today Continue insulin drip; D10+1/2NS+40K Nausea/vomiting/abdominal pain well controlled, continue diet as tolerated for now. Low threshold for NPO. Follow lipase - reassuringly low [place] 2 score 6 (>7 concerning for severe acute dysfunction) Discussed apheresis/plasma exchange should patient  continue to fail IV insulin/current treatment with nephrology - limited use per our discussion.  Anion gap metabolic acidosis Hyperosmolar hyperglycemic non ketotic state (HHNKS) ([place]) -Resolved -Patient reports home insulin requirements upwards of >900 units of insulin per day -During last hospitalization patient was controlled on less than 300 units/day (80 Premeal) with long-acting and sliding scale concerning for dietary noncompliance at home -Patient will likely need further discussion outpatient endocrinology for insulin pump versus basal insulin given her profound insulin needs, I feel the patient would respond well to insulin pump but will defer to outpatient endocrinology   Acute renal failure superimposed on stage 3a chronic kidney disease ([place]), resolved -Recent hospitalization last month for similar episode secondary to diuretic changes -Baseline creatinine around 1.3-1.5. -Creatinine back to baseline today 1.5 -Improving with PO intake/IV fluids -Continue to hold on all diuretics; IV fluids discontinued, increase p.o. intake as appropriate -Consider discharge off diuretics as patient was being evaluated by cardiology in the outpatient setting for reinitiation of her home medications including Entresto as well as diuretics versus medication changes, they were unable to complete this discussion in the office as her labs had reported back severely abnormal and was sent to the ER prior to completing her office visit.   Lower extremity edema, unilateral, acute on chronic, resolved Appears to be somewhat chronic in her RLE >LLE DVT study negative Appears euvolemic   Small intestinal bacterial overgrowth (SIBO) - Has been following with [facility] and continues to have some distention.   - Continue oral doxycycline and metronidazole per previous recommendations    Hypertriglyceridemia - 3662 on presentation up from 980 back in March.  Asymptomatic from an abdominal standpoint. -on  insulin infusion.  Repeat lipid panel in the morning. -Continue lovastatin, fenofibrate. On [place] outpatient.   Essential hypertension Hold Entresto with worsening AKI    Chronic systolic CHF (congestive heart failure) ([place]) -Continue  to hold torsemide and metolazone due to AKI and hyperglycemia.   -Monitor closely while on IV continuous fluid. -Last EF on 5/21 with EF of 45 to 50% with hypokinesis of the inferior and inferior septal wall.   Hypothyroidism Continue levothyroxine  History of astrocytoma of brain S/p resection, stable  DVT prophylaxis: Lovenox Code Status: Full Family Communication: None present  Status is: Inpatient  Dispo: The patient is from: Home              Anticipated d/c is to: Home              Anticipated d/c date is: 24 to 48 hours              Patient currently not medically stable for discharge; triglycerides remain critically high not stable for discharge  Consultants:  None  Procedures:  None  Antimicrobials:  None  Subjective: No acute issues or events overnight, abdominal pain and nausea moderately improved over the past 24 hours  Objective: Vitals:   08/29/21 1112 08/29/21 1556 08/29/21 1941 08/30/21 0343  BP: (!) 107/57 (!) 135/59 (!) 150/87 (!) 151/80  Pulse: (!) 58 87 87 83  Resp: 18 16 20 15   Temp: 98.4 F (36.9 C) 98.2 F (36.8 C) 98.1 F (36.7 C) 98.4 F (36.9 C)  TempSrc: Oral Oral Oral Oral  SpO2: 100% 100% 99% 96%  Weight:      Height:        Intake/Output Summary (Last 24 hours) at 08/30/2021 0717 Last data filed at 08/29/2021 1832 Gross per 24 hour  Intake 600 ml  Output 0 ml  Net 600 ml    Filed Weights   08/26/21 1631  Weight: 63.5 kg    Examination:  General:  Pleasantly resting in bed, No acute distress. HEENT:  Atraumatic.  Sclerae nonicteric, noninjected.  Extraocular movements intact bilaterally. Neck:  Without mass or deformity.  Trachea is midline. Lungs:  Clear to auscultate bilaterally  without rhonchi, wheeze, or rales. Heart:  Regular rate and rhythm.  Without murmurs, rubs, or gallops. Abdomen:  Soft, nontender, nondistended.  Without guarding or rebound. Extremities: Without cyanosis, clubbing   Data Reviewed: I have personally reviewed following labs and imaging studies  CBC: Recent Labs  Lab 08/26/21 1729 08/28/21 0408 08/29/21 0129 08/30/21 0231  WBC 6.7 6.4 6.1 6.5  NEUTROABS 4.1  --   --   --   HGB 11.6* 11.4* 10.7* 10.6*  HCT 33.8* 31.0* 29.9* 30.7*  MCV 84.5 84.5 84.9 87.0  PLT 205 178 168 148*    Basic Metabolic Panel: Recent Labs  Lab 08/27/21 0607 08/27/21 1054 08/28/21 0408 08/29/21 0129 08/30/21 0231  NA 135 135 134* 133* 135  K 3.3* 3.9 3.9 3.8 4.2  CL 95* 92* 94* 95* 105  CO2 24 25 24 24 24   GLUCOSE 109* 131* 283* 260* 154*  BUN 122* 116* 107* 82* 45*  CREATININE 2.21* 2.08* 2.08* 1.90* 1.50*  CALCIUM 8.9 9.2 9.0 9.5 9.0    GFR: Estimated Creatinine Clearance: 42.1 mL/min (A) (by C-G formula based on SCr of 1.5 mg/dL (H)). Liver Function Tests: Recent Labs  Lab 08/26/21 1729  AST 71*  ALT 80*  ALKPHOS 68  BILITOT 0.4  PROT 7.9  ALBUMIN 4.3    Recent Labs  Lab 08/28/21 1156 08/29/21 0129 08/30/21 0231  LIPASE 35 27 27    No results for input(s): AMMONIA in the last 168 hours. Coagulation Profile: No results for input(s): INR,  PROTIME in the last 168 hours. Cardiac Enzymes: No results for input(s): CKTOTAL, CKMB, CKMBINDEX, TROPONINI in the last 168 hours. BNP (last 3 results) No results for input(s): PROBNP in the last 8760 hours. HbA1C: No results for input(s): HGBA1C in the last 72 hours. CBG: Recent Labs  Lab 08/29/21 2147 08/29/21 2251 08/29/21 2351 08/30/21 0153 08/30/21 0607  GLUCAP 171* 153* 142* 173* 147*    Lipid Profile: Recent Labs    08/29/21 0129 08/30/21 0231  CHOL 392* 293*  HDL NOT REPORTED DUE TO HIGH TRIGLYCERIDES NOT REPORTED DUE TO HIGH TRIGLYCERIDES  LDLCALC UNABLE TO  CALCULATE IF TRIGLYCERIDE OVER 400 mg/dL UNABLE TO CALCULATE IF TRIGLYCERIDE OVER 400 mg/dL  TRIG 4,257* 3,288*  CHOLHDL NOT REPORTED DUE TO HIGH TRIGLYCERIDES NOT REPORTED DUE TO HIGH TRIGLYCERIDES  LDLDIRECT NOT REPORTED DUE TO HIGH TRIGLYCERIDES NOT REPORTED DUE TO HIGH TRIGLYCERIDES    Thyroid Function Tests: No results for input(s): TSH, T4TOTAL, FREET4, T3FREE, THYROIDAB in the last 72 hours. Anemia Panel: No results for input(s): VITAMINB12, FOLATE, FERRITIN, TIBC, IRON, RETICCTPCT in the last 72 hours. Sepsis Labs: No results for input(s): PROCALCITON, LATICACIDVEN in the last 168 hours.  No results found for this or any previous visit (from the past 240 hour(s)).       Radiology Studies: No results found.  Scheduled Meds:  aspirin EC  81 mg Oral Daily   carvedilol  6.25 mg Oral BID   dicyclomine  10 mg Oral Daily   doxycycline  100 mg Oral BID   DULoxetine  30 mg Oral QHS   enoxaparin (LOVENOX) injection  40 mg Subcutaneous Daily   erythromycin  1 application. Right Eye QHS   fenofibrate  160 mg Oral Daily   insulin regular human CONCENTRATED  80 Units Subcutaneous TID WC   ivabradine  7.5 mg Oral BID WC   levothyroxine  125 mcg Oral Q0600   metroNIDAZOLE  250 mg Oral TID   pregabalin  300 mg Oral QHS   rosuvastatin  40 mg Oral Daily   tretinoin  1 application. Topical QHS   Continuous Infusions:  D-10-0.45% Sodium Chloride with KCL 1000 ml 50 mL/hr at 08/29/21 1425   insulin 11 Units/hr (08/30/21 0609)     LOS: 4 days   Time spent: 36mn  Arriona Prest C Evaline Waltman, DO [facility]  If 7PM-7AM, please contact night-coverage [email or web address]  08/30/2021, 7:17 AM

## 2021-08-31 DIAGNOSIS — E1100 Type 2 diabetes mellitus with hyperosmolarity without nonketotic hyperglycemic-hyperosmolar coma (NKHHC): Secondary | ICD-10-CM | POA: Diagnosis not present

## 2021-08-31 LAB — BASIC METABOLIC PANEL
Anion gap: 11 (ref 5–15)
BUN: 32 mg/dL — ABNORMAL HIGH (ref 6–20)
CO2: 21 mmol/L — ABNORMAL LOW (ref 22–32)
Calcium: 8.7 mg/dL — ABNORMAL LOW (ref 8.9–10.3)
Chloride: 104 mmol/L (ref 98–111)
Creatinine, Ser: 1.51 mg/dL — ABNORMAL HIGH (ref 0.44–1.00)
GFR, Estimated: 47 mL/min — ABNORMAL LOW (ref >60)
Glucose, Bld: 202 mg/dL — ABNORMAL HIGH (ref 70–99)
Potassium: 4.3 mmol/L (ref 3.5–5.1)
Sodium: 136 mmol/L (ref 135–145)

## 2021-08-31 LAB — GLUCOSE, CAPILLARY
Glucose-Capillary: 227 mg/dL — ABNORMAL HIGH (ref 70–99)
Glucose-Capillary: 203 mg/dL — ABNORMAL HIGH (ref 70–99)
Glucose-Capillary: 173 mg/dL — ABNORMAL HIGH (ref 70–99)
Glucose-Capillary: 164 mg/dL — ABNORMAL HIGH (ref 70–99)
Glucose-Capillary: 133 mg/dL — ABNORMAL HIGH (ref 70–99)
Glucose-Capillary: 178 mg/dL — ABNORMAL HIGH (ref 70–99)
Glucose-Capillary: 239 mg/dL — ABNORMAL HIGH (ref 70–99)
Glucose-Capillary: 269 mg/dL — ABNORMAL HIGH (ref 70–99)
Glucose-Capillary: 283 mg/dL — ABNORMAL HIGH (ref 70–99)

## 2021-08-31 LAB — CBC
HCT: 29.2 % — ABNORMAL LOW (ref 36.0–46.0)
Hemoglobin: 9.4 g/dL — ABNORMAL LOW (ref 12.0–15.0)
MCH: 28.7 pg (ref 26.0–34.0)
MCHC: 32.2 g/dL (ref 30.0–36.0)
MCV: 89.3 fL (ref 80.0–100.0)
Platelets: 135 10*3/uL — ABNORMAL LOW (ref 150–400)
RBC: 3.27 MIL/uL — ABNORMAL LOW (ref 3.87–5.11)
RDW: 14.6 % (ref 11.5–15.5)
WBC: 6.4 10*3/uL (ref 4.0–10.5)
nRBC: 0 % (ref 0.0–0.2)

## 2021-08-31 LAB — LIPID PANEL
Cholesterol: 261 mg/dL — ABNORMAL HIGH (ref 0–200)
LDL Cholesterol: UNDETERMINED mg/dL (ref 0–99)
Triglycerides: 2611 mg/dL — ABNORMAL HIGH (ref <150)
VLDL: UNDETERMINED mg/dL (ref 0–40)

## 2021-08-31 LAB — LIPASE, BLOOD: Lipase: 27 U/L (ref 11–51)

## 2021-08-31 LAB — LDL CHOLESTEROL, DIRECT

## 2021-08-31 MED ORDER — FUROSEMIDE 10 MG/ML IJ SOLN
20.0000 mg | Freq: Once | INTRAMUSCULAR | Status: AC
  Administered 2021-08-31: 20 mg via INTRAVENOUS
  Filled 2021-08-31: qty 2

## 2021-08-31 NOTE — Progress Notes (Signed)
PROGRESS NOTE    Jennifer [name]  MKL:[id number] DOB: 1990/07/18 DOA: 08/26/2021 PCP: Sue Lush, PA-C   Brief Narrative:  Jennifer [name] is a 31 y.o. female with medical history significant of astrocytoma s/p resection and chemotherapy, hyper triglyceridemia, diastolic CHF, SIBO, CKD stage IIIa, insulin-dependent type 2 diabetes who presents at the request of cardiology office with abnormal labs showing worsening renal function, profound hyperglycemia, and hyponatremia.   Recently hospitalized from 5/19-5/23 hyperkalemia and AKI.  Her creatinine improved from 3.59-1.85 after holding diuretics.  Her diuretics were resumed at discharge including Torsemide and metolazone. She followed up with cardiology the day of admission to have lab work with plans to resume Entresto, increase Torsemide and discontinue her spirolactone. Labs were noted to be markedly abnormal (Hyperglycemia/hypercholesterolemia/elevated creatinine from basline ongoing) and sent to ED for evaluation.  Patient's HHNKS resolved in the first 24 hours, unfortunately patient's triglycerides continue to worsen over the subsequent 24 hours requiring prolonged IV insulin and dextrose now over the past few days.  Patient had early signs and symptoms of pancreatitis over the weekend but lipase reassuringly low and symptoms improving on insulin and dextrose.  Baseline triglycerides around [phone], this is our goal for discharge.  Lengthy discussion about need for close follow-up with endocrinology given her uncontrolled hyperglycemia and markedly elevated insulin demands as reportedly on >800u of insulin per day at home per [facility] endocrinologist.  Expect resolution of hypertriglyceridemia to resolve the next 24 to 48 hours with subsequent discharge home, no further needs are required at this time per our discussion.  Assessment & Plan:   Principal Problem:   Hyperosmolar hyperglycemic state (HHS) ([place]) Active Problems:   Acute renal  failure superimposed on stage 3a chronic kidney disease (HCC)   Hypothyroidism   Chronic systolic CHF (congestive heart failure) (HCC)   Essential hypertension   Hypertriglyceridemia   History of astrocytoma of brain   Small intestinal bacterial overgrowth (SIBO)   Lower extremity edema  Acute pancreatitis, improving Concurrent hypertriglyceridemia, POA, stabilizing Triglyceride levels chronically elevated - baseline around [phone] per outpatient testing Triglycerides downtrending  4,500 at intake --> 2600 today Continue insulin drip; D10+1/2NS+40K Nausea/vomiting/abdominal pain essentially resolved back to baseline today, continue regular diet Follow lipase - reassuringly low [place] II score initially 6 (>7 concerning for severe acute dysfunction) but downtrending Discussed apheresis/plasma exchange should patient continue to fail IV insulin/current treatment with nephrology - limited use per our discussion.  Anion gap metabolic acidosis Hyperosmolar hyperglycemic non ketotic state (HHNKS) ([place]) -Resolved -Patient reports home insulin requirements upwards of >800-900 units of insulin per day -During last hospitalization patient was controlled on less than 300 units/day (80 Premeal) with long-acting and sliding scale concerning for dietary noncompliance at home - but was never transitioned to long acting per outpatient endocrinology who keeps her on u500 R. -Patient will likely need further discussion outpatient endocrinology for insulin pump versus basal insulin given her profound insulin needs, I feel the patient would respond well to insulin pump but will defer to outpatient endocrinology   Acute renal failure superimposed on stage 3a chronic kidney disease ([place]), resolved -Recent hospitalization last month for similar episode secondary to diuretic changes -Baseline creatinine around 1.3-1.5. -Creatinine back to baseline today 1.5 -Improving with PO intake/IV fluids -Lasix x1 today  - low dose - mild volume overload -Consider discharge off diuretics as patient was being evaluated by cardiology in the outpatient setting for reinitiation of her home medications including Entresto as well as diuretics versus medication changes,  they were unable to complete this discussion in the office as her labs had reported back severely abnormal and was sent to the ER prior to completing her office visit.   Lower extremity edema, unilateral, acute on chronic, resolved Appears to be somewhat chronic in her RLE >LLE DVT study negative Lasix x1 today   Small intestinal bacterial overgrowth (SIBO) - Has been following with [facility] and continues to have some distention.   - Continue oral doxycycline and metronidazole per previous recommendations    Hypertriglyceridemia - 3662 on presentation up from 980 back in March.  Asymptomatic from an abdominal standpoint. -on insulin infusion.  Repeat lipid panel in the morning. -Continue lovastatin, fenofibrate. On [place] outpatient.   Essential hypertension Hold Entresto with worsening AKI    Chronic systolic CHF (congestive heart failure) (HCC) -Continue to hold torsemide and metolazone  -Lasix x1 today -Monitor closely while on IV continuous fluid. -Last EF on 5/21 with EF of 45 to 50% with hypokinesis of the inferior and inferior septal wall. -Scheduled to see cardiology - they were to decide her regimen at last clinic visit but she was sent here due to lab abnormalities.   Hypothyroidism Continue levothyroxine  History of astrocytoma of brain S/p resection, stable  DVT prophylaxis: Lovenox Code Status: Full Family Communication: None present  Status is: Inpatient  Dispo: The patient is from: Home              Anticipated d/c is to: Home              Anticipated d/c date is: 24 to 48 hours              Patient currently not medically stable for discharge; triglycerides remain critically high - on IVF and insulin drip - not  stable for discharge  Consultants:  None  Procedures:  None  Antimicrobials:  None  Subjective: No acute issues or events overnight, abdominal pain and nausea moderately improved over the past 24 hours  Objective: Vitals:   08/30/21 2009 08/30/21 2317 08/31/21 0327 08/31/21 0421  BP: (!) 166/84 (!) 140/99 (!) 141/78 (!) 141/78  Pulse: 92 86 85 90  Resp: 20 17 20 17   Temp: 98.2 F (36.8 C) 98.1 F (36.7 C) 98.2 F (36.8 C) 98.3 F (36.8 C)  TempSrc: Oral Oral Oral Oral  SpO2: 96% 97% 97% 95%  Weight:      Height:        Intake/Output Summary (Last 24 hours) at 08/31/2021 0709 Last data filed at 08/31/2021 8250 Gross per 24 hour  Intake 3060.25 ml  Output --  Net 3060.25 ml    Filed Weights   08/26/21 1631  Weight: 63.5 kg    Examination:  General:  Pleasantly resting in bed, No acute distress. HEENT:  Atraumatic.  Sclerae nonicteric, noninjected.  Extraocular movements intact bilaterally. Neck:  Without mass or deformity.  Trachea is midline. Lungs:  Clear to auscultate bilaterally without rhonchi, wheeze, or rales. Heart:  Regular rate and rhythm.  Without murmurs, rubs, or gallops. Abdomen:  Soft, nontender, nondistended.  Without guarding or rebound. Extremities: Without cyanosis, clubbing   Data Reviewed: I have personally reviewed following labs and imaging studies  CBC: Recent Labs  Lab 08/26/21 1729 08/28/21 0408 08/29/21 0129 08/30/21 0231 08/31/21 0213  WBC 6.7 6.4 6.1 6.5 6.4  NEUTROABS 4.1  --   --   --   --   HGB 11.6* 11.4* 10.7* 10.6* 9.4*  HCT 33.8* 31.0*  29.9* 30.7* 29.2*  MCV 84.5 84.5 84.9 87.0 89.3  PLT 205 178 168 148* 135*    Basic Metabolic Panel: Recent Labs  Lab 08/27/21 1054 08/28/21 0408 08/29/21 0129 08/30/21 0231 08/31/21 0213  NA 135 134* 133* 135 136  K 3.9 3.9 3.8 4.2 4.3  CL 92* 94* 95* 105 104  CO2 25 24 24 24  21*  GLUCOSE 131* 283* 260* 154* 202*  BUN 116* 107* 82* 45* 32*  CREATININE 2.08* 2.08* 1.90*  1.50* 1.51*  CALCIUM 9.2 9.0 9.5 9.0 8.7*    GFR: Estimated Creatinine Clearance: 41.8 mL/min (A) (by C-G formula based on SCr of 1.51 mg/dL (H)). Liver Function Tests: Recent Labs  Lab 08/26/21 1729  AST 71*  ALT 80*  ALKPHOS 68  BILITOT 0.4  PROT 7.9  ALBUMIN 4.3    Recent Labs  Lab 08/28/21 1156 08/29/21 0129 08/30/21 0231 08/31/21 0213  LIPASE 35 27 27 27     No results for input(s): AMMONIA in the last 168 hours. Coagulation Profile: No results for input(s): INR, PROTIME in the last 168 hours. Cardiac Enzymes: No results for input(s): CKTOTAL, CKMB, CKMBINDEX, TROPONINI in the last 168 hours. BNP (last 3 results) No results for input(s): PROBNP in the last 8760 hours. HbA1C: No results for input(s): HGBA1C in the last 72 hours. CBG: Recent Labs  Lab 08/31/21 0149 08/31/21 0245 08/31/21 0419 08/31/21 0530 08/31/21 0656  GLUCAP 227* 203* 173* 164* 133*    Lipid Profile: Recent Labs    08/30/21 0231 08/31/21 0213  CHOL 293* 261*  HDL NOT REPORTED DUE TO HIGH TRIGLYCERIDES NOT REPORTED DUE TO HIGH TRIGLYCERIDES  LDLCALC UNABLE TO CALCULATE IF TRIGLYCERIDE OVER 400 mg/dL UNABLE TO CALCULATE IF TRIGLYCERIDE OVER 400 mg/dL  TRIG 3,288* 2,611*  CHOLHDL NOT REPORTED DUE TO HIGH TRIGLYCERIDES NOT REPORTED DUE TO HIGH TRIGLYCERIDES  LDLDIRECT NOT REPORTED DUE TO HIGH TRIGLYCERIDES NOT REPORTED DUE TO HIGH TRIGLYCERIDES    Thyroid Function Tests: No results for input(s): TSH, T4TOTAL, FREET4, T3FREE, THYROIDAB in the last 72 hours. Anemia Panel: No results for input(s): VITAMINB12, FOLATE, FERRITIN, TIBC, IRON, RETICCTPCT in the last 72 hours. Sepsis Labs: No results for input(s): PROCALCITON, LATICACIDVEN in the last 168 hours.  No results found for this or any previous visit (from the past 240 hour(s)).       Radiology Studies: No results found.  Scheduled Meds:  aspirin EC  81 mg Oral Daily   carvedilol  6.25 mg Oral BID   dicyclomine  10 mg  Oral Daily   doxycycline  100 mg Oral BID   DULoxetine  30 mg Oral QHS   enoxaparin (LOVENOX) injection  40 mg Subcutaneous Daily   erythromycin  1 application. Right Eye QHS   fenofibrate  160 mg Oral Daily   ivabradine  7.5 mg Oral BID WC   levothyroxine  125 mcg Oral Q0600   metroNIDAZOLE  250 mg Oral TID   pregabalin  300 mg Oral QHS   rosuvastatin  40 mg Oral Daily   Continuous Infusions:  D-10-0.45% Sodium Chloride with KCL 1000 ml 50 mL/hr at 08/31/21 1505   insulin 9.5 Units/hr (08/31/21 0704)     LOS: 5 days   Time spent: 75mn  Jazmynn Pho C Josetta Wigal, DO [facility]  If 7PM-7AM, please contact night-coverage [email or web address]  08/31/2021, 7:09 AM

## 2021-09-01 ENCOUNTER — Inpatient Hospital Stay (HOSPITAL_COMMUNITY): Payer: BC Managed Care – PPO

## 2021-09-01 DIAGNOSIS — E1100 Type 2 diabetes mellitus with hyperosmolarity without nonketotic hyperglycemic-hyperosmolar coma (NKHHC): Secondary | ICD-10-CM | POA: Diagnosis not present

## 2021-09-01 DIAGNOSIS — N179 Acute kidney failure, unspecified: Secondary | ICD-10-CM | POA: Diagnosis not present

## 2021-09-01 DIAGNOSIS — I10 Essential (primary) hypertension: Secondary | ICD-10-CM | POA: Diagnosis not present

## 2021-09-01 DIAGNOSIS — E039 Hypothyroidism, unspecified: Secondary | ICD-10-CM | POA: Diagnosis not present

## 2021-09-01 LAB — LIPID PANEL
Cholesterol: 249 mg/dL — ABNORMAL HIGH (ref 0–200)
LDL Cholesterol: UNDETERMINED mg/dL (ref 0–99)
Triglycerides: 1805 mg/dL — ABNORMAL HIGH (ref <150)
VLDL: UNDETERMINED mg/dL (ref 0–40)

## 2021-09-01 LAB — BASIC METABOLIC PANEL
Anion gap: 9 (ref 5–15)
Anion gap: 12 (ref 5–15)
BUN: 25 mg/dL — ABNORMAL HIGH (ref 6–20)
BUN: 28 mg/dL — ABNORMAL HIGH (ref 6–20)
CO2: 21 mmol/L — ABNORMAL LOW (ref 22–32)
CO2: 25 mmol/L (ref 22–32)
Calcium: 8.6 mg/dL — ABNORMAL LOW (ref 8.9–10.3)
Calcium: 9.5 mg/dL (ref 8.9–10.3)
Chloride: 102 mmol/L (ref 98–111)
Chloride: 98 mmol/L (ref 98–111)
Creatinine, Ser: 1.79 mg/dL — ABNORMAL HIGH (ref 0.44–1.00)
Creatinine, Ser: 1.98 mg/dL — ABNORMAL HIGH (ref 0.44–1.00)
GFR, Estimated: 39 mL/min — ABNORMAL LOW (ref >60)
GFR, Estimated: 34 mL/min — ABNORMAL LOW (ref >60)
Glucose, Bld: 268 mg/dL — ABNORMAL HIGH (ref 70–99)
Glucose, Bld: 282 mg/dL — ABNORMAL HIGH (ref 70–99)
Potassium: 4.9 mmol/L (ref 3.5–5.1)
Potassium: 5.6 mmol/L — ABNORMAL HIGH (ref 3.5–5.1)
Sodium: 132 mmol/L — ABNORMAL LOW (ref 135–145)
Sodium: 135 mmol/L (ref 135–145)

## 2021-09-01 LAB — GLUCOSE, CAPILLARY
Glucose-Capillary: 226 mg/dL — ABNORMAL HIGH (ref 70–99)
Glucose-Capillary: 295 mg/dL — ABNORMAL HIGH (ref 70–99)
Glucose-Capillary: 317 mg/dL — ABNORMAL HIGH (ref 70–99)
Glucose-Capillary: 410 mg/dL — ABNORMAL HIGH (ref 70–99)
Glucose-Capillary: 328 mg/dL — ABNORMAL HIGH (ref 70–99)
Glucose-Capillary: 273 mg/dL — ABNORMAL HIGH (ref 70–99)
Glucose-Capillary: 288 mg/dL — ABNORMAL HIGH (ref 70–99)
Glucose-Capillary: 302 mg/dL — ABNORMAL HIGH (ref 70–99)
Glucose-Capillary: 240 mg/dL — ABNORMAL HIGH (ref 70–99)
Glucose-Capillary: 182 mg/dL — ABNORMAL HIGH (ref 70–99)
Glucose-Capillary: 222 mg/dL — ABNORMAL HIGH (ref 70–99)

## 2021-09-01 LAB — CBC
HCT: 28.6 % — ABNORMAL LOW (ref 36.0–46.0)
Hemoglobin: 9.5 g/dL — ABNORMAL LOW (ref 12.0–15.0)
MCH: 29.1 pg (ref 26.0–34.0)
MCHC: 33.2 g/dL (ref 30.0–36.0)
MCV: 87.7 fL (ref 80.0–100.0)
Platelets: 121 10*3/uL — ABNORMAL LOW (ref 150–400)
RBC: 3.26 MIL/uL — ABNORMAL LOW (ref 3.87–5.11)
RDW: 14.6 % (ref 11.5–15.5)
WBC: 7.1 10*3/uL (ref 4.0–10.5)
nRBC: 0 % (ref 0.0–0.2)

## 2021-09-01 LAB — LIPASE, BLOOD: Lipase: 26 U/L (ref 11–51)

## 2021-09-01 LAB — LDL CHOLESTEROL, DIRECT

## 2021-09-01 LAB — BRAIN NATRIURETIC PEPTIDE: B Natriuretic Peptide: 366 pg/mL — ABNORMAL HIGH (ref 0.0–100.0)

## 2021-09-01 LAB — TROPONIN I (HIGH SENSITIVITY): Troponin I (High Sensitivity): 13 ng/L (ref <18)

## 2021-09-01 MED ORDER — HYDROCODONE-ACETAMINOPHEN 5-325 MG PO TABS
1.0000 | ORAL_TABLET | Freq: Once | ORAL | Status: AC
  Administered 2021-09-01: 1 via ORAL
  Filled 2021-09-01: qty 1

## 2021-09-01 MED ORDER — FUROSEMIDE 10 MG/ML IJ SOLN
40.0000 mg | Freq: Once | INTRAMUSCULAR | Status: AC
  Administered 2021-09-01: 40 mg via INTRAVENOUS
  Filled 2021-09-01: qty 4

## 2021-09-01 MED ORDER — DEXTROSE-NACL 10-0.45 % IV SOLN
INTRAVENOUS | Status: DC
  Filled 2021-09-01 (×5): qty 1000

## 2021-09-01 MED ORDER — HYDROMORPHONE HCL 1 MG/ML IJ SOLN
0.5000 mg | Freq: Once | INTRAMUSCULAR | Status: AC
  Administered 2021-09-01: 0.5 mg via INTRAVENOUS
  Filled 2021-09-01: qty 0.5

## 2021-09-01 MED ORDER — DEXTROSE-NACL 5-0.45 % IV SOLN: INTRAVENOUS | Status: DC

## 2021-09-01 MED ORDER — FUROSEMIDE 10 MG/ML IJ SOLN
40.0000 mg | Freq: Once | INTRAMUSCULAR | Status: AC
Start: 2021-09-01 — End: 2021-09-01
  Administered 2021-09-01: 40 mg via INTRAVENOUS
  Filled 2021-09-01: qty 4

## 2021-09-01 NOTE — Progress Notes (Signed)
Inpatient Diabetes Program Recommendations  AACE/ADA: New Consensus Statement on Inpatient Glycemic Control (2015)  Target Ranges:  Prepandial:   less than 140 mg/dL      Peak postprandial:   less than 180 mg/dL (1-2 hours)      Critically ill patients:  140 - 180 mg/dL   Lab Results  Component Value Date   GLUCAP 317 (H) 09/01/2021   HGBA1C 7.9 (H) 08/15/2021    Review of Glycemic Control  Latest Reference Range & Units 08/31/21 20:08 09/01/21 00:25 09/01/21 04:42 09/01/21 08:36 09/01/21 11:45  Glucose-Capillary 70 - 99 mg/dL 283 (H) 295 (H) 226 (H) 317 (H) 410 (H)  (H): Data is abnormally high Diabetes history: Type 2 DM Outpatient Diabetes medications: Humulin R U500 260 units TID with meals, Humulin R U500 100 units QHS Current orders for Inpatient glycemic control: IV insulin Q4H  Inpatient Diabetes Program Recommendations:    Glucose trends significantly elevated. RN verifies that MD provided order to utilize Endotool Q4H only.   Current CBG > 400 mg/dL.   At this time consider: Using Endotool as ordered and would change to type 2 DM (this will provide more consistent data for resistance and plan following with SQ insulin) OR Place orders for IV insulin infusion for hypertriglyceridemia for pancreatitis at the 0.2 multiplier.   Secure chat sent to MD.  Following.  Thanks, Bronson Curb, MSN, RNC-OB Diabetes Coordinator [phone] (8a-5p)

## 2021-09-01 NOTE — Progress Notes (Signed)
Upon entering pt room @ 1900 for shift pt had c/o heaviness in her chest, pt had reported this sensation earlier today and had received 60m IV lasix. Pt VSS, satting 97% on RA. Denies SOB. MD notified, orders placed for 453mIV lasix, CXR, BNP, and BMP. Will continue to monitor.

## 2021-09-01 NOTE — Progress Notes (Addendum)
PROGRESS NOTE    Jennifer [name]  JAS:[id number] DOB: 1990-10-04 DOA: 08/26/2021 PCP: Sue Lush, PA-C   Brief Narrative: Jennifer [name] is a 31 y.o. female with medical history significant of astrocytoma s/p resection and chemotherapy, hyper triglyceridemia, diastolic CHF, SIBO, CKD stage IIIa, insulin-dependent type 2 diabetes who presents at the request of cardiology office with abnormal labs showing worsening renal function, profound hyperglycemia, and hyponatremia.    Recently hospitalized from 5/19-5/23 hyperkalemia and AKI.  Her creatinine improved from 3.59-1.85 after holding diuretics.  Her diuretics were resumed at discharge including Torsemide and metolazone. She followed up with cardiology the day of admission to have lab work with plans to resume Entresto, increase Torsemide and discontinue her spirolactone. Labs were noted to be markedly abnormal (Hyperglycemia/hypercholesterolemia/elevated creatinine from basline ongoing) and sent to ED for evaluation.   Patient's HHNKS resolved in the first 24 hours, unfortunately patient's triglycerides continue to worsen over the subsequent 24 hours requiring prolonged IV insulin and dextrose now over the past few days.  Patient had early signs and symptoms of pancreatitis over the weekend but lipase reassuringly low and symptoms improving on insulin and dextrose.  Baseline triglycerides around [phone], this is our goal for discharge.  Lengthy discussion about need for close follow-up with endocrinology given her uncontrolled hyperglycemia and markedly elevated insulin demands as reportedly on >800u of insulin per day at home per [facility] endocrinologist.   Assessment and Plan:  Acute pancreatitis Secondary to hypertriglyceridemia. Symptoms resolved.  Severe hypertriglyceridemia Secondary to underlying genetic disorder. Triglycerides of 3,662 on admission with peak of 4,552. Trending down with insulin IV. -Continue Insulin IV and D10  IV fluids -Continue fenofibrate  Lipoprotein lipase deficiency Patient follows with endocrinology who has referred her to a genetics counselor  AKI on CKD stage IIIa Baseline creatinine between 1.3 - 2. Difficult to ascertain. Creatinine of 2.59 on admission with peak of 2.9. Improved with IV fluids. Stable.  Diabetes mellitus, type 2 Anion gap metabolic acidosis Hyperosmolar hyperglycemic non-ketotic state Patient follows with endocrinology as an outpatient. Hemoglobin A1C of 7.9%. Patient is on a significantly high dose of insulin and currently administers insulin regular (U500) 260 units TID with meals and 100 units qHS. Patient initiated on insulin drip while inpatient. -Continue insulin IV as mentioned above  Right leg edema Chronic. Stable.  Small intestinal bacterial overgrowth Follows with [facility]. -Continue chronic doxycycline and metronidazole  Primary hypertension -Continue Coreg  Chronic systolic heart failure LVEF of 45-50%. Stable. Entresto, torsemide and metolazone held secondary to AKI. -Continue Coreg and ivabradine  Hypothyroidism -Continue Synthroid  History of astrocytoma of the brain S/p resection.  DVT prophylaxis: Lovenox Code Status:   Code Status: Full Code Family Communication: None at bedside Disposition Plan: Discharge home likely in 24 hours if triglycerides continue to improve to hopefully below 1000   Consultants:  None  Procedures:  None  Antimicrobials: Doxycycline Flagyl    Subjective: Patient reports no issues overnight.  Objective: BP (!) 171/92 (BP Location: Right Arm)   Pulse 96   Temp 98.6 F (37 C) (Oral)   Resp 20   Ht 4' 9"  (1.448 m)   Wt 63.5 kg   SpO2 92%   BMI 30.30 kg/m   Examination:  General exam: Appears calm and comfortable Respiratory system: Clear to auscultation. Respiratory effort normal. Cardiovascular system: S1 & S2 heard, RRR. No murmurs, rubs, gallops or clicks. Gastrointestinal system:  Abdomen is nondistended, soft and nontender. Normal bowel sounds heard. Central nervous  system: Alert and oriented. No focal neurological deficits. Musculoskeletal: RLE edema. No calf tenderness Skin: No cyanosis. No rashes Psychiatry: Judgement and insight appear normal. Mood & affect appropriate.    Data Reviewed: I have personally reviewed following labs and imaging studies  CBC Lab Results  Component Value Date   WBC 7.1 09/01/2021   RBC 3.26 (L) 09/01/2021   HGB 9.5 (L) 09/01/2021   HCT 28.6 (L) 09/01/2021   MCV 87.7 09/01/2021   MCH 29.1 09/01/2021   PLT 121 (L) 09/01/2021   MCHC 33.2 09/01/2021   RDW 14.6 09/01/2021   LYMPHSABS 1.9 08/26/2021   MONOABS 0.5 08/26/2021   EOSABS 0.1 08/26/2021   BASOSABS 0.1 38/88/2800     Last metabolic panel Lab Results  Component Value Date   NA 132 (L) 09/01/2021   K 4.9 09/01/2021   CL 102 09/01/2021   CO2 21 (L) 09/01/2021   BUN 25 (H) 09/01/2021   CREATININE 1.79 (H) 09/01/2021   GLUCOSE 268 (H) 09/01/2021   GFRNONAA 39 (L) 09/01/2021   GFRAA 68 08/22/2019   CALCIUM 8.6 (L) 09/01/2021   PHOS 5.3 (H) 12/04/2020   PROT 7.9 08/26/2021   ALBUMIN 4.3 08/26/2021   BILITOT 0.4 08/26/2021   ALKPHOS 68 08/26/2021   AST 71 (H) 08/26/2021   ALT 80 (H) 08/26/2021   ANIONGAP 9 09/01/2021    GFR: Estimated Creatinine Clearance: 35.3 mL/min (A) (by C-G formula based on SCr of 1.79 mg/dL (H)).  No results found for this or any previous visit (from the past 240 hour(s)).    Radiology Studies: DG CHEST PORT 1 VIEW  Result Date: 09/01/2021 CLINICAL DATA:  Chest pressure, dyspnea EXAM: PORTABLE CHEST 1 VIEW COMPARISON:  08/26/2021 FINDINGS: Borderline enlarged cardiac silhouette, likely accentuated by technique. Both lungs are clear. The visualized skeletal structures are unremarkable. IMPRESSION: No evidence of acute cardiopulmonary disease. Electronically Signed   By: Maurine Simmering M.D.   On: 09/01/2021 13:00      LOS: 6 days     Cordelia Poche, MD [facility] 09/01/2021, 4:19 PM   If 7PM-7AM, please contact night-coverage [email or web address]

## 2021-09-01 NOTE — Progress Notes (Addendum)
Pt with chest pressure, persistent despite dose of lasix.  BNP now elevated to 366 compared to 11 on admit.  Trop 13, 2nd pending.     Latest Ref Rng & Units 09/01/2021    8:26 PM 09/01/2021    2:01 AM 08/31/2021    2:13 AM  BMP  Glucose 70 - 99 mg/dL 282   268   202    BUN 6 - 20 mg/dL 28   25   32    Creatinine 0.44 - 1.00 mg/dL 1.98   1.79   1.51    Sodium 135 - 145 mmol/L 135   132   136    Potassium 3.5 - 5.1 mmol/L 5.6   4.9   4.3    Chloride 98 - 111 mmol/L 98   102   104    CO2 22 - 32 mmol/L 25   21   21     Calcium 8.9 - 10.3 mg/dL 9.5   8.6   8.7     CXR clear.  Suspect CHF most likely given h/o dCHF, need for IVF this admit in form of Dextrose + insulin gtt being used to treat hypertriglyceridemia which is causing acute pancreatitis.  1) 2nd trop pending 2) changing D10 half NS with K to D10 half NS without K 3) pt just got 40 of lasix at beginning of shift 4) if no improvement with lasix, may need to [facility] the D10 overnight.

## 2021-09-01 NOTE — Progress Notes (Signed)
Mobility Specialist Progress Note    09/01/21 1715  Mobility  Activity Ambulated independently in hallway  Level of Assistance Modified independent, requires aide device or extra time  Assistive Device Other (Comment) (IV pole)  Distance Ambulated (ft) 470 ft  Activity Response Tolerated well  $Mobility charge 1 Mobility   Pre-Mobility: 91 HR, 164/94 BP Post-Mobility: 90 HR  Pt received in bed and agreeable. No complaints on walk. Returned to bed with call bell in reach.    Hildred Alamin Mobility Specialist

## 2021-09-02 ENCOUNTER — Encounter (HOSPITAL_COMMUNITY): Payer: BC Managed Care – PPO

## 2021-09-02 DIAGNOSIS — E1100 Type 2 diabetes mellitus with hyperosmolarity without nonketotic hyperglycemic-hyperosmolar coma (NKHHC): Secondary | ICD-10-CM | POA: Diagnosis not present

## 2021-09-02 LAB — LIPID PANEL
Cholesterol: 176 mg/dL (ref 0–200)
HDL: 26 mg/dL — ABNORMAL LOW (ref >40)
LDL Cholesterol: UNDETERMINED mg/dL (ref 0–99)
Total CHOL/HDL Ratio: 6.8 RATIO
Triglycerides: 874 mg/dL — ABNORMAL HIGH (ref <150)
VLDL: UNDETERMINED mg/dL (ref 0–40)

## 2021-09-02 LAB — BASIC METABOLIC PANEL
Anion gap: 12 (ref 5–15)
BUN: 34 mg/dL — ABNORMAL HIGH (ref 6–20)
CO2: 22 mmol/L (ref 22–32)
Calcium: 9 mg/dL (ref 8.9–10.3)
Chloride: 99 mmol/L (ref 98–111)
Creatinine, Ser: 2.14 mg/dL — ABNORMAL HIGH (ref 0.44–1.00)
GFR, Estimated: 31 mL/min — ABNORMAL LOW (ref >60)
Glucose, Bld: 165 mg/dL — ABNORMAL HIGH (ref 70–99)
Potassium: 4.5 mmol/L (ref 3.5–5.1)
Sodium: 133 mmol/L — ABNORMAL LOW (ref 135–145)

## 2021-09-02 LAB — GLUCOSE, CAPILLARY
Glucose-Capillary: 156 mg/dL — ABNORMAL HIGH (ref 70–99)
Glucose-Capillary: 158 mg/dL — ABNORMAL HIGH (ref 70–99)
Glucose-Capillary: 163 mg/dL — ABNORMAL HIGH (ref 70–99)
Glucose-Capillary: 166 mg/dL — ABNORMAL HIGH (ref 70–99)
Glucose-Capillary: 170 mg/dL — ABNORMAL HIGH (ref 70–99)
Glucose-Capillary: 172 mg/dL — ABNORMAL HIGH (ref 70–99)
Glucose-Capillary: 184 mg/dL — ABNORMAL HIGH (ref 70–99)
Glucose-Capillary: 232 mg/dL — ABNORMAL HIGH (ref 70–99)

## 2021-09-02 LAB — LDL CHOLESTEROL, DIRECT: Direct LDL: 34.6 mg/dL (ref 0–99)

## 2021-09-02 LAB — LIPASE, BLOOD: Lipase: 20 U/L (ref 11–51)

## 2021-09-02 LAB — TROPONIN I (HIGH SENSITIVITY): Troponin I (High Sensitivity): 11 ng/L (ref <18)

## 2021-09-02 MED ORDER — FUROSEMIDE 10 MG/ML IJ SOLN
40.0000 mg | Freq: Once | INTRAMUSCULAR | Status: AC
  Administered 2021-09-02: 40 mg via INTRAVENOUS
  Filled 2021-09-02: qty 4

## 2021-09-02 NOTE — Discharge Summary (Signed)
Physician Discharge Summary   Patient: Jennifer [name] MRN: [id number] DOB: [birth year]  Admit date:     08/26/2021  Discharge date: 09/02/21  Discharge Physician: Cordelia Poche, MD   PCP: Sue Lush, PA-C   Recommendations at discharge:  PCP follow-up  Discharge Diagnoses: Principal Problem:   Hyperosmolar hyperglycemic state (HHS) [facility]) Active Problems:   Acute renal failure superimposed on stage 3a chronic kidney disease ([place])   Hypothyroidism   Chronic systolic CHF (congestive heart failure) (HCC)   Essential hypertension   Hypertriglyceridemia   History of astrocytoma of brain   Small intestinal bacterial overgrowth (SIBO)   Lower extremity edema  Resolved Problems:   * No resolved hospital problems. *  Hospital Course: Jennifer [name] is a 31 y.o. female with medical history significant of astrocytoma s/p resection and chemotherapy, hyper triglyceridemia, diastolic CHF, SIBO, CKD stage IIIa, insulin-dependent type 2 diabetes. Patient presented secondary to abnormal labs indicating AKI, hyperglycemia and hyponatremia and found to also have evidence of acute pancreatitis.  Assessment and Plan:  Severe hypertriglyceridemia Secondary to underlying genetic disorder. Triglycerides of 3,662 on admission with peak of 4,552. Trending down with insulin IV. Triglycerides of 874 on day of discharge which is around baseline for patient. Continue fenofibrate. Follow-up with endocrinologist/PCP.   Lipoprotein lipase deficiency Patient follows with endocrinology who has referred her to a genetics counselor and allergist.   AKI on CKD stage IIIa Baseline creatinine between 1.3 - 2. Difficult to ascertain. Creatinine of 2.59 on admission with peak of 2.9. Improved with IV fluids. Stable.   Diabetes mellitus, type 2 Anion gap metabolic acidosis Hyperosmolar hyperglycemic non-ketotic state Patient follows with endocrinology as an outpatient. Hemoglobin A1C of 7.9%. Patient is on a  significantly high dose of insulin and currently administers insulin regular (U500) 260 units TID with meals and 100 units qHS. Patient initiated on insulin drip while inpatient for management of HHS and hypertriglyceridemia. Transition to home regimen on discharge.  Chest pressure Likely related to fluid overload in setting of continuous IV fluids. Chest x-ray without overt edema. BNP elevated to 366. Troponin of 13-11. Resolved with Lasix.   Right leg edema Chronic. Stable.   Small intestinal bacterial overgrowth Follows with [facility]. Continue chronic doxycycline and metronidazole.   Primary hypertension -Continue Coreg   Chronic systolic heart failure LVEF of 45-50%. Stable. Entresto, torsemide and metolazone held secondary to AKI. Continue Coreg and ivabradine.   Hypothyroidism Continue Synthroid   History of astrocytoma of the brain S/p resection.  Acute pancreatitis No elevated lipase and no imaging to confirm diagnosis. Does not meet criteria for acute pancreatitis.    Consultants: None Procedures performed: None  Disposition: Home Diet recommendation: Carb modified, cardiac, low sodium  DISCHARGE MEDICATION: Allergies as of 09/02/2021       Reactions   Ketamine Other (See Comments)   In vegetative state for 15 minutes per pt   [place] Clam (m. Mercenaria) Skin Test Itching, Other (See Comments), Rash   Clarithromycin Other (See Comments)   Stomach pain   Fentanyl Nausea And Vomiting   [place] Mushroom [maitake] Itching   Itchy throat   Penicillin V Itching   Gi upset   Shellfish Allergy Swelling, Other (See Comments)   Throat swelling. Pt has never had shellfish but assumes it will cause swelling. Pt states contrast in CT is okay   Morphine Itching, Rash   Penicillins Rash   Has patient had a PCN reaction causing immediate rash, facial/tongue/throat swelling, SOB or lightheadedness  with hypotension: Y Has patient had a PCN reaction causing severe rash  involving mucus membranes or skin necrosis: Y Has patient had a PCN reaction that required hospitalization: N Has patient had a PCN reaction occurring within the last 10 years: Y If all of the above answers are "NO", then may proceed with Cephalosporin use.   Prednisone Rash        Medication List     STOP taking these medications    ibuprofen 800 MG tablet Commonly known as: ADVIL   prochlorperazine 10 MG tablet Commonly known as: COMPAZINE       TAKE these medications    albuterol 108 (90 Base) MCG/ACT inhaler Commonly known as: VENTOLIN HFA Inhale 1 puff into the lungs every 6 (six) hours as needed for wheezing or shortness of breath.   aspirin EC 81 MG tablet Take 1 tablet (81 mg total) by mouth daily. Swallow whole.   carvedilol 6.25 MG tablet Commonly known as: COREG Take 1 tablet (6.25 mg total) by mouth 2 (two) times daily.   cetirizine 10 MG tablet Commonly known as: ZYRTEC Take 10 mg by mouth daily.   dicyclomine 10 MG capsule Commonly known as: BENTYL Take 10 mg by mouth daily.   doxycycline 100 MG tablet Commonly known as: VIBRA-TABS Take 100 mg by mouth 2 (two) times daily. Patient states she is taking this medication every day continues   DULoxetine 30 MG capsule Commonly known as: CYMBALTA Take 30 mg by mouth at bedtime.   Entresto 24-26 MG Generic drug: sacubitril-valsartan Take 1 tablet by mouth 2 (two) times daily.   erythromycin ophthalmic ointment Place 1 application. into the right eye at bedtime.   fenofibrate 160 MG tablet Take 1 tablet (160 mg total) by mouth daily.   fluticasone 50 MCG/ACT nasal spray Commonly known as: FLONASE Place 1 spray into both nostrils daily.   insulin regular human CONCENTRATED 500 UNIT/ML injection Commonly known as: HUMULIN R Inject 100-260 Units into the skin See admin instructions. 260 units 3 times daily with each meal, 100 units at bedtime per patient   ivabradine 7.5 MG Tabs  tablet Commonly known as: CORLANOR Take 1 tablet (7.5 mg total) by mouth 2 (two) times daily with a meal.   Jencycla 0.35 MG tablet Generic drug: norethindrone Take 1 tablet by mouth daily.   levothyroxine 125 MCG tablet Commonly known as: SYNTHROID Take 1 tablet (125 mcg total) by mouth daily at 6 (six) AM.   metolazone 2.5 MG tablet Commonly known as: ZAROXOLYN Take 1 tablet (2.5 mg total) by mouth once a week for 10 doses.   metroNIDAZOLE 250 MG tablet Commonly known as: FLAGYL Take 250 mg by mouth 3 (three) times daily. Patient states she is taking this medication everyday continues per patient   multivitamin with minerals Tabs tablet Take 1 tablet by mouth daily.   nitroGLYCERIN 0.4 MG SL tablet Commonly known as: NITROSTAT Place 1 tablet (0.4 mg total) under the tongue every 5 (five) minutes as needed for up to 25 days for chest pain.   ofloxacin 0.3 % ophthalmic solution Commonly known as: OCUFLOX Place 1 drop into both eyes daily as needed (only take after eye injections per patient).   ondansetron 4 MG tablet Commonly known as: ZOFRAN Take 4 mg by mouth every 8 (eight) hours as needed for nausea or vomiting.   pantoprazole 40 MG tablet Commonly known as: PROTONIX Take 40 mg by mouth daily.   potassium chloride SA 20 MEQ  tablet Commonly known as: KLOR-CON M Take 2 tablets (40 mEq total) by mouth once a week for 10 doses.   pregabalin 300 MG capsule Commonly known as: LYRICA Take 300 mg by mouth at bedtime. What changed: Another medication with the same name was changed. Make sure you understand how and when to take each.   pregabalin 100 MG capsule Commonly known as: LYRICA Take 1-2 capsules (100-200 mg total) by mouth See admin instructions. 200 mg in the morning, 100 mg in the afternnon What changed: additional instructions   promethazine 25 MG tablet Commonly known as: PHENERGAN Take 25 mg by mouth every 6 (six) hours as needed for vomiting or  nausea.   Repatha 140 MG/ML Sosy Generic drug: Evolocumab Inject 140 mg into the skin See admin instructions. Inject 140 mg subcutaneously every other Wednesday evening   rosuvastatin 40 MG tablet Commonly known as: CRESTOR Take 40 mg by mouth daily.   torsemide 20 MG tablet Commonly known as: DEMADEX Take 40-60 mg by mouth See admin instructions. Take 3 tablets by mouth in the morning and then take two tablets by mouth in the evening per patient What changed: Another medication with the same name was removed. Continue taking this medication, and follow the directions you see here.   tretinoin 0.05 % cream Commonly known as: RETIN-A Apply 1 application topically at bedtime.        Follow-up Information     Sue Lush, PA-C. Schedule an appointment as soon as possible for a visit in 1 week(s).   Specialty: Physician Assistant Why: For hospital follow-up Contact information: [address] [place] [id number] [phone]                Discharge Exam: BP 136/78 (BP Location: Right Arm)   Pulse 97   Temp 98.3 F (36.8 C) (Oral)   Resp 19   Ht 4' 9"  (1.448 m)   Wt 67.3 kg   LMP 05/16/2021 (Exact Date)   SpO2 95%   BMI 32.09 kg/m   General exam: Appears calm and comfortable Respiratory system: Clear to auscultation. Respiratory effort normal. Cardiovascular system: S1 & S2 heard, Normal rate with regular rhythm. Gastrointestinal system: Abdomen is distended, soft and nontender. No organomegaly or masses felt. Normal bowel sounds heard. Central nervous system: Alert and oriented. No focal neurological deficits. Musculoskeletal: No calf tenderness Skin: No cyanosis. No rashes Psychiatry: Judgement and insight appear normal. Mood & affect appropriate.   Condition at discharge: stable  The results of significant diagnostics from this hospitalization (including imaging, microbiology, ancillary and laboratory) are listed below for reference.    Imaging Studies: DG Chest Port 1V same Day  Result Date: 09/01/2021 CLINICAL DATA:  Chest pressure. EXAM: PORTABLE CHEST 1 VIEW COMPARISON:  Chest x-ray 09/01/2021 FINDINGS: The heart size and mediastinal contours are within normal limits. Both lungs are clear. The visualized skeletal structures are unremarkable. IMPRESSION: No active disease. Electronically Signed   By: Ronney Asters M.D.   On: 09/01/2021 19:52   DG CHEST PORT 1 VIEW  Result Date: 09/01/2021 CLINICAL DATA:  Chest pressure, dyspnea EXAM: PORTABLE CHEST 1 VIEW COMPARISON:  08/26/2021 FINDINGS: Borderline enlarged cardiac silhouette, likely accentuated by technique. Both lungs are clear. The visualized skeletal structures are unremarkable. IMPRESSION: No evidence of acute cardiopulmonary disease. Electronically Signed   By: Maurine Simmering M.D.   On: 09/01/2021 13:00   VAS [place] LOWER EXTREMITY VENOUS (DVT)  Result Date: 08/28/2021  Lower Venous  DVT Study Patient Name:  AI SONNENFELD  Date of Exam:   08/27/2021 Medical Rec #: [id number]          Accession #:    [id number] Date of Birth: 02-22-1991          Patient Gender: F Patient Age:   78 years Exam Location:  [facility] Procedure:      VAS [place] LOWER EXTREMITY VENOUS (DVT) Referring Phys: CHING TU --------------------------------------------------------------------------------  Indications: Edema.  Comparison Study: No prior study Performing Technologist: Maudry Mayhew MHA, RDMS, RVT, RDCS  Examination Guidelines: A complete evaluation includes B-mode imaging, spectral Doppler, color Doppler, and power Doppler as needed of all accessible portions of each vessel. Bilateral testing is considered an integral part of a complete examination. Limited examinations for reoccurring indications may be performed as noted. The reflux portion of the exam is performed with the patient in reverse Trendelenburg.  +---------+---------------+---------+-----------+----------+--------------+ RIGHT     CompressibilityPhasicitySpontaneityPropertiesThrombus Aging +---------+---------------+---------+-----------+----------+--------------+ CFV      Full           Yes      Yes                                 +---------+---------------+---------+-----------+----------+--------------+ SFJ      Full                                                        +---------+---------------+---------+-----------+----------+--------------+ FV Prox  Full                                                        +---------+---------------+---------+-----------+----------+--------------+ FV Mid   Full                                                        +---------+---------------+---------+-----------+----------+--------------+ FV DistalFull                                                        +---------+---------------+---------+-----------+----------+--------------+ PFV      Full                                                        +---------+---------------+---------+-----------+----------+--------------+ POP      Full           Yes      Yes                                 +---------+---------------+---------+-----------+----------+--------------+ PTV      Full                                                        +---------+---------------+---------+-----------+----------+--------------+  PERO     Full                                                        +---------+---------------+---------+-----------+----------+--------------+   +----+---------------+---------+-----------+----------+--------------+ LEFTCompressibilityPhasicitySpontaneityPropertiesThrombus Aging +----+---------------+---------+-----------+----------+--------------+ CFV Full           Yes      Yes                                 +----+---------------+---------+-----------+----------+--------------+     Summary: RIGHT: - There is no evidence of deep vein thrombosis in the lower  extremity.  - No cystic structure found in the popliteal fossa.  LEFT: - No evidence of common femoral vein obstruction.  *See table(s) above for measurements and observations. Electronically signed by Deitra Mayo MD on 08/28/2021 at 5:45:10 AM.    Final    DG Chest Portable 1 View  Result Date: 08/26/2021 CLINICAL DATA:  Hyperglycemia.  Hyperkalemia. EXAM: PORTABLE CHEST 1 VIEW COMPARISON:  08/13/2021 FINDINGS: The heart size and mediastinal contours are within normal limits. Both lungs are clear. The visualized skeletal structures are unremarkable. IMPRESSION: No active disease. Electronically Signed   By: Nelson Chimes M.D.   On: 08/26/2021 17:30   ECHOCARDIOGRAM COMPLETE  Result Date: 08/15/2021    ECHOCARDIOGRAM REPORT   Patient Name:   YARITSA SAVARINO Date of Exam: 08/15/2021 Medical Rec #:  [id number]         Height:       57.0 in Accession #:    [id number]        Weight:       143.3 lb Date of Birth:  26-Jul-1990         BSA:          1.561 m Patient Age:    31 years          BP:           123/93 mmHg Patient Gender: F                 HR:           107 bpm. Exam Location:  Inpatient Procedure: 2D Echo, Cardiac Doppler and Color Doppler Indications:    CHF- Acute systolic  History:        Patient has prior history of Echocardiogram examinations, most                 recent 03/10/2021. CHF; Risk Factors:Diabetes.  Sonographer:    Jefferey Pica Referring Phys: [id number] Margie Billet IMPRESSIONS  1. Hypokinesis of the infeirior, inferoseptal walls . Left ventricular ejection fraction, by estimation, is 45 to 50%. The left ventricle has mildly decreased function.  2. Right ventricular systolic function is normal. The right ventricular size is normal. There is normal pulmonary artery systolic pressure.  3. . The mitral valve is normal in structure. Mild to moderate mitral valve regurgitation.  4. The aortic valve is normal in structure. Aortic valve regurgitation is mild. Comparison(s): The left  ventricular function is unchanged. FINDINGS  Left Ventricle: Hypokinesis of the infeirior, inferoseptal walls. Left ventricular ejection fraction, by estimation, is 45 to 50%. The left ventricle has mildly decreased function. The left ventricular internal cavity size was normal in size. There is no left ventricular  hypertrophy. Right Ventricle: The right ventricular size is normal. Right vetricular wall thickness was not assessed. Right ventricular systolic function is normal. There is normal pulmonary artery systolic pressure. The tricuspid regurgitant velocity is 2.50 m/s, and with an assumed right atrial pressure of 5 mmHg, the estimated right ventricular systolic pressure is 62.9 mmHg. Left Atrium: Left atrial size was normal in size. Right Atrium: Right atrial size was normal in size. Pericardium: There is no evidence of pericardial effusion. Mitral Valve: The mitral valve is normal in structure. Mild to moderate mitral valve regurgitation. Tricuspid Valve: The tricuspid valve is normal in structure. Tricuspid valve regurgitation is mild. Aortic Valve: The aortic valve is normal in structure. Aortic valve regurgitation is mild. Aortic regurgitation PHT measures 530 msec. Aortic valve peak gradient measures 7.4 mmHg. Pulmonic Valve: The pulmonic valve was normal in structure. Pulmonic valve regurgitation is mild. Aorta: The aortic root and ascending aorta are structurally normal, with no evidence of dilitation. IAS/Shunts: No atrial level shunt detected by color flow Doppler.  LEFT VENTRICLE PLAX 2D LVIDd:         4.50 cm     Diastology LVIDs:         3.40 cm     LV e' lateral:   10.30 cm/s LV PW:         1.10 cm     LV E/e' lateral: 9.8 LV IVS:        1.10 cm LVOT diam:     1.90 cm LV SV:         58 LV SV Index:   37 LVOT Area:     2.84 cm  LV Volumes (MOD) LV vol d, MOD A4C: 92.8 ml LV vol s, MOD A4C: 56.3 ml LV SV MOD A4C:     92.8 ml RIGHT VENTRICLE RV Basal diam:  2.60 cm RV S prime:     11.30 cm/s TAPSE  (M-mode): 1.7 cm LEFT ATRIUM             Index        RIGHT ATRIUM           Index LA diam:        3.10 cm 1.99 cm/m   RA Area:     10.10 cm LA Vol (A2C):   32.6 ml 20.88 ml/m  RA Volume:   21.60 ml  13.84 ml/m LA Vol (A4C):   30.0 ml 19.22 ml/m LA Biplane Vol: 31.7 ml 20.31 ml/m  AORTIC VALVE                 PULMONIC VALVE AV Area (Vmax): 2.50 cm     PV Vmax:       1.09 m/s AV Vmax:        136.00 cm/s  PV Peak grad:  4.8 mmHg AV Peak Grad:   7.4 mmHg LVOT Vmax:      120.00 cm/s LVOT Vmean:     76.300 cm/s LVOT VTI:       0.203 m AI PHT:         530 msec  AORTA Ao Root diam: 2.90 cm Ao Asc diam:  2.90 cm MITRAL VALVE                TRICUSPID VALVE MV Area (PHT): 6.54 cm     TR Peak grad:   25.0 mmHg MV Decel Time: 116 msec     TR Vmax:        250.00 cm/s MV E  velocity: 101.00 cm/s MV A velocity: 83.70 cm/s   SHUNTS MV E/A ratio:  1.21         Systemic VTI:  0.20 m                             Systemic Diam: 1.90 cm Dorris Carnes MD Electronically signed by Dorris Carnes MD Signature Date/Time: 08/15/2021/4:12:12 PM    Final    US RENAL  Result Date: 08/14/2021 CLINICAL DATA:  Acute kidney injury. EXAM: RENAL / URINARY TRACT ULTRASOUND COMPLETE COMPARISON:  Renal ultrasound 05/13/2021. FINDINGS: Right Kidney: Renal measurements: 10.1 x 5.0 x 4.6 cm = volume: 14.1 mL. Echogenicity within normal limits. No mass or hydronephrosis visualized. Left Kidney: Renal measurements: 11.5 x 4.8 x 4.5 cm = volume: 132 mL. Echogenicity within normal limits. No mass or hydronephrosis visualized. Bladder: Appears normal for degree of bladder distention. Other: None. IMPRESSION: Normal renal ultrasound Electronically Signed   By: Ronney Asters M.D.   On: 08/14/2021 15:09   DG Chest 2 View  Result Date: 08/13/2021 CLINICAL DATA:  Shortness of breath, CHF EXAM: CHEST - 2 VIEW COMPARISON:  05/13/2021 FINDINGS: Cardiac and mediastinal contours are within normal limits given AP technique. No focal pulmonary opacity. No pleural  effusion or pneumothorax. No acute osseous abnormality. IMPRESSION: No acute cardiopulmonary process. Electronically Signed   By: Merilyn Baba M.D.   On: 08/13/2021 19:25   CARDIAC CATHETERIZATION  Result Date: 08/05/2021 Findings: RA = 12 prominent V waves RV = 47/14 PA = 48/25 (37) PCW = 25 Fick cardiac output/index = 4.4/2.8 PVR =3.7 WU Ao sat = 99% PA sat = 62%, 62% Assessment: 1. Elevated filling pressures with normal cardiac output 2. Mild to moderate mixed pulmonary HTN with prominent v-waves in RA suggestive of significant TR Plan/Discussion: Diurese.  Wil give dose of IV lasix prior to d/c. Add metolazone 2.5 + Kcl 40 once a week. Recheck echo to look at TV. Can consider cautious trial of selective pulmonary vasodilators once fluid status improved. Glori Bickers, MD 10:17 AM   Microbiology: Results for orders placed or performed during the hospital encounter of 05/13/21  Resp Panel by RT-PCR (Flu A&B, Covid)     Status: None   Collection Time: 05/13/21  6:14 PM   Specimen: Nasopharyngeal(NP) swabs in vial transport medium  Result Value Ref Range Status   SARS Coronavirus 2 by RT PCR NEGATIVE NEGATIVE Final    Comment: (NOTE) SARS-CoV-2 target nucleic acids are NOT DETECTED.  The SARS-CoV-2 RNA is generally detectable in upper respiratory specimens during the acute phase of infection. The lowest concentration of SARS-CoV-2 viral copies this assay can detect is 138 copies/mL. A negative result does not preclude SARS-Cov-2 infection and should not be used as the sole basis for treatment or other patient management decisions. A negative result may occur with  improper specimen collection/handling, submission of specimen other than nasopharyngeal swab, presence of viral mutation(s) within the areas targeted by this assay, and inadequate number of viral copies(<138 copies/mL). A negative result must be combined with clinical observations, patient history, and  epidemiological information. The expected result is Negative.  Fact Sheet for Patients:  EntrepreneurPulse.com.au  Fact Sheet for Healthcare Providers:  IncredibleEmployment.be  This test is no t yet approved or cleared by the [place] FDA and  has been authorized for detection and/or diagnosis of SARS-CoV-2 by FDA under an Emergency Use Authorization (EUA). This EUA will remain  in effect (meaning this test can be used) for the duration of the COVID-19 declaration under Section 564(b)(1) of the Act, 21 U.S.C.section 360bbb-3(b)(1), unless the authorization is terminated  or revoked sooner.       Influenza A by PCR NEGATIVE NEGATIVE Final   Influenza B by PCR NEGATIVE NEGATIVE Final    Comment: (NOTE) The Xpert Xpress SARS-CoV-2/FLU/RSV plus assay is intended as an aid in the diagnosis of influenza from Nasopharyngeal swab specimens and should not be used as a sole basis for treatment. Nasal washings and aspirates are unacceptable for Xpert Xpress SARS-CoV-2/FLU/RSV testing.  Fact Sheet for Patients: EntrepreneurPulse.com.au  Fact Sheet for Healthcare Providers: IncredibleEmployment.be  This test is not yet approved or cleared by the [place] FDA and has been authorized for detection and/or diagnosis of SARS-CoV-2 by FDA under an Emergency Use Authorization (EUA). This EUA will remain in effect (meaning this test can be used) for the duration of the COVID-19 declaration under Section 564(b)(1) of the Act, 21 U.S.C. section 360bbb-3(b)(1), unless the authorization is terminated or revoked.  Performed at [facility], [address], [place], [place] [address]     Labs: CBC: Recent Labs  Lab 08/26/21 1729 08/28/21 0408 08/29/21 0129 08/30/21 0231 08/31/21 0213 09/01/21 0201  WBC 6.7 6.4 6.1 6.5 6.4 7.1  NEUTROABS 4.1  --   --   --   --   --   HGB 11.6* 11.4*  10.7* 10.6* 9.4* 9.5*  HCT 33.8* 31.0* 29.9* 30.7* 29.2* 28.6*  MCV 84.5 84.5 84.9 87.0 89.3 87.7  PLT 205 178 168 148* 135* 938*   Basic Metabolic Panel: Recent Labs  Lab 08/30/21 0231 08/31/21 0213 09/01/21 0201 09/01/21 2026 09/02/21 0553  NA 135 136 132* 135 133*  K 4.2 4.3 4.9 5.6* 4.5  CL 105 104 102 98 99  CO2 24 21* 21* 25 22  GLUCOSE 154* 202* 268* 282* 165*  BUN 45* 32* 25* 28* 34*  CREATININE 1.50* 1.51* 1.79* 1.98* 2.14*  CALCIUM 9.0 8.7* 8.6* 9.5 9.0   Liver Function Tests: Recent Labs  Lab 08/26/21 1729  AST 71*  ALT 80*  ALKPHOS 68  BILITOT 0.4  PROT 7.9  ALBUMIN 4.3   CBG: Recent Labs  Lab 09/02/21 0433 09/02/21 0610 09/02/21 0734 09/02/21 0946 09/02/21 1207  GLUCAP 156* 166* 184* 232* 163*    Discharge time spent: 35 minutes.  Signed: Cordelia Poche, MD [facility] 09/02/2021

## 2021-09-02 NOTE — Progress Notes (Signed)
   08/26/21 0005  CM Assessment  Discharge Readiness Status Complete  Expected Discharge Plan Home/Self Care  Discharge Planning Services CM Consult;HF Clinic  Post Acute Care Choice NA  Choice offered to / list presented to  NA  DME Arranged N/A  DME Agency NA  HH Arranged NA  [place] Agency NA  Status of Service Completed, signed off  Discharge Disposition Home/Self Care   Pt stable for transition home today, to follow up with PCP and AHF clinic.

## 2021-09-02 NOTE — Progress Notes (Signed)
Mobility Specialist: Progress Note   09/02/21 1108  Mobility  Activity Ambulated independently in hallway  Level of Assistance Modified independent, requires aide device or extra time  Assistive Device Other (Comment) (IV pole)  Distance Ambulated (ft) 1200 ft  Activity Response Tolerated well  $Mobility charge 1 Mobility   Pre-Mobility: 95 HR, 136/78 (95) BP, 95% SpO2 During Mobility: 106 HR Post-Mobility: 99 HR, 124/81 (95) BP, 99% SpO2  Received pt in bed having no complaints and agreeable to mobility. Asymptomatic throughout ambulation. To BR after returning to the room, then to the chair w/ call bell in reach and all needs met.  [facility] Daniella Dewberry Mobility Specialist Mobility Specialist [address]: [phone]

## 2021-09-02 NOTE — Discharge Instructions (Signed)
Jennifer [name],  You were in the hospital with pancreatitis which was precipitated by your high triglycerides. This was treated with IV insulin and has improved. Please follow-up with your PCP/endocrinologist.

## 2021-09-06 ENCOUNTER — Telehealth (HOSPITAL_COMMUNITY): Payer: Self-pay

## 2021-09-06 NOTE — Telephone Encounter (Signed)
Attempted to contact pt ref home visit post hosp d/c at end of last week.  There was no answer, I did LVM for her to return my call. Also sent a text as she has responded to that in the past as well.   [name], [place] 09/06/2021

## 2021-09-07 ENCOUNTER — Other Ambulatory Visit (HOSPITAL_COMMUNITY): Payer: Self-pay | Admitting: Family Medicine

## 2021-09-08 ENCOUNTER — Other Ambulatory Visit (HOSPITAL_COMMUNITY): Payer: Self-pay

## 2021-09-08 ENCOUNTER — Telehealth (HOSPITAL_COMMUNITY): Payer: Self-pay | Admitting: Surgery

## 2021-09-08 MED ORDER — CARVEDILOL 6.25 MG PO TABS: 6.2500 mg | ORAL_TABLET | Freq: Two times a day (BID) | ORAL | 6 refills | Status: DC

## 2021-09-08 NOTE — Progress Notes (Signed)
Paramedicine Encounter    Patient ID: Jennifer [name], female    DOB: 05-15-1990, 31 y.o.   MRN: [id number] Pt recently d/c from [place], she reports feeling much better.  Her CBG's the past few days was 100s and then today it was 200s.   Pt reports her breathing is doing good.  She denies increased sob, no c/p, she has not had any flutters or palpitations recently.  She reports her weight had increased and she didn't have much urine output and after she took torsemide it went down.  She feels she still has fluid on her.  Will need to verify what dosage the clinic wants her on--her list shows torsemide 98m daily but at last clinic visit it was increased to 60/40.  She had already filled pill box.  Upon checking it she had some errors in there-missing a few things here and there, she had old 1/2 tab of spiro in there but said she wasn't taking it though.  Sent message to triage ref her carvedilol needing refilled and it looks like it was prescribed by hospitalist.  It was sent in by time I was leaving her.  I gave her a 4 row pill box and filled it up for her during our visit.  She prefers to take metolazone in the evening on Fridays-she said it doesn't work well during the day.  Will f/u next week.    BP 108/62   Pulse 100   Resp 16   Wt 140 lb (63.5 kg)   LMP 05/16/2021 (Exact Date)   SpO2 99%   BMI 30.30 kg/m  Weight yesterday-140 Last visit weight-142 @ clinic   Patient Care Team: PElinor Parkinsonas PCP - General (Physician Assistant) BJacelyn Pi MD as PCP - Endocrinology (Endocrinology)  Patient Active Problem List   Diagnosis Date Noted   Hyperosmolar hyperglycemic state (HHS) ([facility] 08/26/2021   Small intestinal bacterial overgrowth (SIBO) 08/26/2021   Lower extremity edema 08/26/2021   Hyperkalemia 08/14/2021   Hx of insulin dependent diabetes mellitus 08/14/2021   Acute kidney failure (HC[place] 05/13/2021   Chest pain 03/10/2021   Acute on chronic combined  systolic and diastolic CHF (congestive heart failure) (HM[place] 02/09/2021   History of astrocytoma of brain 02/09/2021   Severe hyperglycemia due to diabetes mellitus (HD[place] [place] 01/25/2021   Diarrhea    Elevated transaminase level    Acute combined systolic and diastolic heart failure (HCC)    Nonischemic cardiomyopathy (HCC)    Acute decompensated heart failure (HCC)    Elevated liver enzymes    Acute renal failure superimposed on stage 3a chronic kidney disease (HB[place] 11/26/2020   Abdominal pain 026/94/8546  Chronic systolic CHF (congestive heart failure) (HD[place] 11/23/2020   Essential hypertension 11/23/2020   Hypertriglyceridemia 11/23/2020   DM (diabetes mellitus) ([place] 10/10/2018   Hypothyroidism 10/10/2018    Current Outpatient Medications:    albuterol (VENTOLIN HFA) 108 (90 Base) MCG/ACT inhaler, Inhale 1 puff into the lungs every 6 (six) hours as needed for wheezing or shortness of breath., Disp: , Rfl:    aspirin EC 81 MG EC tablet, Take 1 tablet (81 mg total) by mouth daily. Swallow whole., Disp: 30 tablet, Rfl: 1   cetirizine (ZYRTEC) 10 MG tablet, Take 10 mg by mouth daily., Disp: , Rfl:    doxycycline (VIBRA-TABS) 100 MG tablet, Take 100 mg by mouth 2 (two) times daily. Patient states she is taking this medication every day continues, Disp: , Rfl:    DULoxetine (  CYMBALTA) 30 MG capsule, Take 30 mg by mouth at bedtime., Disp: , Rfl:    erythromycin ophthalmic ointment, Place 1 application. into the right eye at bedtime., Disp: , Rfl:    Evolocumab (REPATHA) 140 MG/ML SOSY, Inject 140 mg into the skin See admin instructions. Inject 140 mg subcutaneously every other Wednesday evening, Disp: , Rfl:    fenofibrate 160 MG tablet, Take 1 tablet (160 mg total) by mouth daily., Disp: 30 tablet, Rfl: 1   fluticasone (FLONASE) 50 MCG/ACT nasal spray, Place 1 spray into both nostrils daily., Disp: , Rfl:    insulin regular human CONCENTRATED (HUMULIN R) 500 UNIT/ML injection, Inject 100-260 Units  into the skin See admin instructions. 260 units 3 times daily with each meal, 100 units at bedtime per patient, Disp: , Rfl:    ivabradine (CORLANOR) 7.5 MG TABS tablet, Take 1 tablet (7.5 mg total) by mouth 2 (two) times daily with a meal., Disp: 60 tablet, Rfl: 6   JENCYCLA 0.35 MG tablet, Take 1 tablet by mouth daily., Disp: , Rfl:    levothyroxine (SYNTHROID) 125 MCG tablet, Take 1 tablet (125 mcg total) by mouth daily at 6 (six) AM., Disp: 30 tablet, Rfl: 1   metolazone (ZAROXOLYN) 2.5 MG tablet, Take 1 tablet (2.5 mg total) by mouth once a week for 10 doses., Disp: 1 tablet, Rfl: 0   metroNIDAZOLE (FLAGYL) 250 MG tablet, Take 250 mg by mouth 3 (three) times daily. Patient states she is taking this medication everyday continues per patient, Disp: , Rfl:    Multiple Vitamin (MULTIVITAMIN WITH MINERALS) TABS tablet, Take 1 tablet by mouth daily., Disp: 30 tablet, Rfl: 1   nitroGLYCERIN (NITROSTAT) 0.4 MG SL tablet, Place 1 tablet (0.4 mg total) under the tongue every 5 (five) minutes as needed for up to 25 days for chest pain., Disp: 25 tablet, Rfl: 3   ofloxacin (OCUFLOX) 0.3 % ophthalmic solution, Place 1 drop into both eyes daily as needed (only take after eye injections per patient)., Disp: , Rfl:    ondansetron (ZOFRAN) 4 MG tablet, Take 4 mg by mouth every 8 (eight) hours as needed for nausea or vomiting., Disp: , Rfl:    pantoprazole (PROTONIX) 40 MG tablet, Take 40 mg by mouth daily., Disp: , Rfl:    potassium chloride (KLOR-CON M) 20 MEQ tablet, Take 2 tablets (40 mEq total) by mouth once a week for 10 doses., Disp: 20 tablet, Rfl: 0   pregabalin (LYRICA) 100 MG capsule, Take 1-2 capsules (100-200 mg total) by mouth See admin instructions. 200 mg in the morning, 100 mg in the afternnon (Patient taking differently: Take 100-200 mg by mouth See admin instructions. Take 200 mg by mouth in the morning, 100 mg by mouth in the afternoon), Disp: 120 capsule, Rfl: 10   pregabalin (LYRICA) 300 MG  capsule, Take 300 mg by mouth at bedtime., Disp: , Rfl:    promethazine (PHENERGAN) 25 MG tablet, Take 25 mg by mouth every 6 (six) hours as needed for vomiting or nausea., Disp: , Rfl:    rosuvastatin (CRESTOR) 40 MG tablet, Take 40 mg by mouth daily., Disp: , Rfl:    sacubitril-valsartan (ENTRESTO) 24-26 MG, Take 1 tablet by mouth 2 (two) times daily., Disp: 180 tablet, Rfl: 3   torsemide (DEMADEX) 20 MG tablet, TAKE 2 TABLETS BY MOUTH DAILY, Disp: 180 tablet, Rfl: 3   tretinoin (RETIN-A) 0.05 % cream, Apply 1 application topically at bedtime., Disp: , Rfl:    carvedilol (COREG)  6.25 MG tablet, Take 1 tablet (6.25 mg total) by mouth 2 (two) times daily., Disp: 60 tablet, Rfl: 6   dicyclomine (BENTYL) 10 MG capsule, Take 10 mg by mouth daily. (Patient not taking: Reported on 09/08/2021), Disp: , Rfl:  Allergies  Allergen Reactions   Ketamine Other (See Comments)    In vegetative state for 15 minutes per pt   [place] Clam (M. Mercenaria) Skin Test Itching, Other (See Comments) and Rash   Clarithromycin Other (See Comments)    Stomach pain    Fentanyl Nausea And Vomiting   Maitake Mushroom [Maitake] Itching    Itchy throat   Penicillin V Itching    Gi upset   Shellfish Allergy Swelling and Other (See Comments)    Throat swelling. Pt has never had shellfish but assumes it will cause swelling. Pt states contrast in CT is okay   Morphine Itching and Rash   Penicillins Rash    Has patient had a PCN reaction causing immediate rash, facial/tongue/throat swelling, SOB or lightheadedness with hypotension: Y Has patient had a PCN reaction causing severe rash involving mucus membranes or skin necrosis: Y Has patient had a PCN reaction that required hospitalization: N Has patient had a PCN reaction occurring within the last 10 years: Y If all of the above answers are "NO", then may proceed with Cephalosporin use.    Prednisone Rash      Social History   Socioeconomic History    Marital status: Married    Spouse name: Not on file   Number of children: 0   Years of education: Not on file   Highest education level: Not on file  Occupational History   Occupation: [employer]  Tobacco Use   Smoking status: Never   Smokeless tobacco: Never  Vaping Use   Vaping Use: Never used  Substance and Sexual Activity   Alcohol use: Never   Drug use: Never   Sexual activity: Yes  Other Topics Concern   Not on file  Social History Narrative   Not on file   Social Determinants of Health   Financial Resource Strain: Medium Risk (08/19/2021)   Overall Financial Resource Strain (CARDIA)    Difficulty of Paying Living Expenses: Somewhat hard  Food Insecurity: Food Insecurity Present (08/19/2021)   Hunger Vital Sign    Worried About Running Out of Food in the Last Year: Sometimes true    Ran Out of Food in the Last Year: Never true  Transportation Needs: No Transportation Needs (08/19/2021)   PRAPARE - Hydrologist (Medical): No    Lack of Transportation (Non-Medical): No  Physical Activity: Not on file  Stress: Not on file  Social Connections: Not on file  Intimate Partner Violence: Not on file    Physical Exam      Future Appointments  Date Time Provider [place]  09/24/2021  1:40 PM [name], Shaune Pascal, MD [place] None       [name], [place] Paramedic  09/08/21

## 2021-09-08 NOTE — Telephone Encounter (Signed)
-----   Message from [name], EMT sent at 09/08/2021  3:59 PM EDT ----- Marykay Lex, her carvedilol needs to be refilled, looks like it was a hospitalist that filled it last so she needs it from provider in clinic.   Thanks!   [name], [place] 09/08/2021

## 2021-09-08 NOTE — Telephone Encounter (Signed)
[facility] Paramedic [name] sent a message that patient needs refill for Coreg through our clinic.  Refill sent.

## 2021-09-09 ENCOUNTER — Encounter (HOSPITAL_COMMUNITY): Payer: BC Managed Care – PPO

## 2021-09-10 ENCOUNTER — Encounter (HOSPITAL_BASED_OUTPATIENT_CLINIC_OR_DEPARTMENT_OTHER): Payer: BC Managed Care – PPO | Admitting: Internal Medicine

## 2021-09-10 ENCOUNTER — Other Ambulatory Visit (HOSPITAL_COMMUNITY): Payer: Self-pay | Admitting: Family Medicine

## 2021-09-10 ENCOUNTER — Other Ambulatory Visit (HOSPITAL_COMMUNITY): Payer: Self-pay | Admitting: *Deleted

## 2021-09-10 DIAGNOSIS — I5022 Chronic systolic (congestive) heart failure: Secondary | ICD-10-CM

## 2021-09-10 MED ORDER — CARVEDILOL 3.125 MG PO TABS: 3.1250 mg | ORAL_TABLET | Freq: Two times a day (BID) | ORAL | 6 refills | Status: DC

## 2021-09-13 ENCOUNTER — Other Ambulatory Visit (HOSPITAL_COMMUNITY): Payer: Self-pay | Admitting: Family Medicine

## 2021-09-13 MED ORDER — CARVEDILOL 3.125 MG PO TABS: 3.1250 mg | ORAL_TABLET | Freq: Two times a day (BID) | ORAL | 6 refills | Status: DC

## 2021-09-15 ENCOUNTER — Telehealth (HOSPITAL_COMMUNITY): Payer: Self-pay | Admitting: Licensed Clinical Social Worker

## 2021-09-15 NOTE — Telephone Encounter (Addendum)
HF Paramedicine Team Based Care Meeting  HF MD- NA  HF NP - [place] NP-C   [facility] admit within the last 30 days for heart failure? Not for heart failure  Medications concerns? Pill box has errors- pt does not have good insight into the mistakes  Education needs? Continued reinforcement of compliance  Eligible for discharge? Still fairly new and has active concerns so not ready for DC at this time  Jorge Ny, [place] [facility] Desk#: [phone] Cell#: [phone]

## 2021-09-16 ENCOUNTER — Other Ambulatory Visit (HOSPITAL_COMMUNITY): Payer: Self-pay

## 2021-09-16 NOTE — Progress Notes (Signed)
Paramedicine Encounter    Patient ID: Jennifer [name], female    DOB: 1990-06-24, 31 y.o.   MRN: [id number]  Pt reports that she feels much better.  She does report that she has not been taking her BC pills and is going in July for [facility] appoint.  She kept forgetting to take it and missing a lot of days so she just quit taking it. She has denied being pregnant, she has taken multiple pregnancy tests and they were negative.  She reports her breathing is doing good. She went to have MRI on back yesterday but unk of the results yet. He was able to lay flat on the MRI table without any issues and able to sleep flat as well-no issues.  No c/p, no palpitations.  She had called in to the clinic last week about her lower b/p, her carvedilol was decreased to 3.116m BID and since then she has felt better.  She reports better cbg's due to placement of where she is giving herself insuling-she was injecting it in her legs but now is doing it in her stomach and is getting much better readings.  CBG's-runnign in the 100s on higher side  She does have some swelling to her rt leg-she reports no feeling of bloating.  Verified meds and checked pill box-there were some errors in there so we fixed that.   She has appoint in clinic next Friday.  I will f/u with her next week and assist with pill bos again.  She seems thankful for the help and correcting those errors.    BP 108/80   Pulse (!) 104   Resp 16   Wt 140 lb (63.5 kg)   LMP 05/16/2021 (Exact Date)   SpO2 98%   BMI 30.30 kg/m  Weight yesterday-140  Last visit weight-140   Patient Care Team: PElinor Parkinsonas PCP - General (Physician Assistant) BJacelyn Pi MD as PCP - Endocrinology (Endocrinology)  Patient Active Problem List   Diagnosis Date Noted   Hyperosmolar hyperglycemic state (HHS) ([facility] 08/26/2021   Small intestinal bacterial overgrowth (SIBO) 08/26/2021   Lower extremity edema 08/26/2021   Hyperkalemia 08/14/2021   Hx of  insulin dependent diabetes mellitus 08/14/2021   Acute kidney failure (HR[place] 05/13/2021   Chest pain 03/10/2021   Acute on chronic combined systolic and diastolic CHF (congestive heart failure) ([facility] 02/09/2021   History of astrocytoma of brain 02/09/2021   Severe hyperglycemia due to diabetes mellitus (HE[facility] [place] 01/25/2021   Diarrhea    Elevated transaminase level    Acute combined systolic and diastolic heart failure (HCC)    Nonischemic cardiomyopathy (HCC)    Acute decompensated heart failure (HCC)    Elevated liver enzymes    Acute renal failure superimposed on stage 3a chronic kidney disease (HLiberty[facility] 11/26/2020   Abdominal pain 040/98/1191  Chronic systolic CHF (congestive heart failure) ([facility] 11/23/2020   Essential hypertension 11/23/2020   Hypertriglyceridemia 11/23/2020   DM (diabetes mellitus) (HNorth [place] 10/10/2018   Hypothyroidism 10/10/2018    Current Outpatient Medications:    albuterol (VENTOLIN HFA) 108 (90 Base) MCG/ACT inhaler, Inhale 1 puff into the lungs every 6 (six) hours as needed for wheezing or shortness of breath., Disp: , Rfl:    aspirin EC 81 MG EC tablet, Take 1 tablet (81 mg total) by mouth daily. Swallow whole. (Patient taking differently: Take 81 mg by mouth daily. Swallow whole.), Disp: 30 tablet, Rfl: 1   carvedilol (COREG) 3.125 MG tablet, Take 1 tablet (3.125  mg total) by mouth 2 (two) times daily with a meal., Disp: 60 tablet, Rfl: 6   cetirizine (ZYRTEC) 10 MG tablet, Take 10 mg by mouth daily., Disp: , Rfl:    doxycycline (VIBRA-TABS) 100 MG tablet, Take 100 mg by mouth 2 (two) times daily. Patient states she is taking this medication every day continues, Disp: , Rfl:    DULoxetine (CYMBALTA) 30 MG capsule, Take 30 mg by mouth at bedtime., Disp: , Rfl:    erythromycin ophthalmic ointment, Place 1 application. into the right eye at bedtime., Disp: , Rfl:    Evolocumab (REPATHA) 140 MG/ML SOSY, Inject 140 mg into the skin See admin instructions. Inject 140 mg  subcutaneously every other Wednesday evening, Disp: , Rfl:    fenofibrate 160 MG tablet, Take 1 tablet (160 mg total) by mouth daily., Disp: 30 tablet, Rfl: 1   fluticasone (FLONASE) 50 MCG/ACT nasal spray, Place 1 spray into both nostrils daily., Disp: , Rfl:    insulin regular human CONCENTRATED (HUMULIN R) 500 UNIT/ML injection, Inject 100-260 Units into the skin See admin instructions. 260 units 3 times daily with each meal, 100 units at bedtime per patient, Disp: , Rfl:    ivabradine (CORLANOR) 7.5 MG TABS tablet, Take 1 tablet (7.5 mg total) by mouth 2 (two) times daily with a meal., Disp: 60 tablet, Rfl: 6   levothyroxine (SYNTHROID) 125 MCG tablet, Take 1 tablet (125 mcg total) by mouth daily at 6 (six) AM., Disp: 30 tablet, Rfl: 1   metolazone (ZAROXOLYN) 2.5 MG tablet, Take 1 tablet (2.5 mg total) by mouth once a week for 10 doses., Disp: 1 tablet, Rfl: 0   metroNIDAZOLE (FLAGYL) 250 MG tablet, Take 250 mg by mouth 3 (three) times daily. Patient states she is taking this medication everyday continues per patient, Disp: , Rfl:    Multiple Vitamin (MULTIVITAMIN WITH MINERALS) TABS tablet, Take 1 tablet by mouth daily., Disp: 30 tablet, Rfl: 1   nitroGLYCERIN (NITROSTAT) 0.4 MG SL tablet, Place 1 tablet (0.4 mg total) under the tongue every 5 (five) minutes as needed for up to 25 days for chest pain., Disp: 25 tablet, Rfl: 3   ofloxacin (OCUFLOX) 0.3 % ophthalmic solution, Place 1 drop into both eyes daily as needed (only take after eye injections per patient)., Disp: , Rfl:    ondansetron (ZOFRAN) 4 MG tablet, Take 4 mg by mouth every 8 (eight) hours as needed for nausea or vomiting., Disp: , Rfl:    pantoprazole (PROTONIX) 40 MG tablet, Take 40 mg by mouth daily., Disp: , Rfl:    potassium chloride (KLOR-CON M) 20 MEQ tablet, Take 2 tablets (40 mEq total) by mouth once a week for 10 doses., Disp: 20 tablet, Rfl: 0   pregabalin (LYRICA) 100 MG capsule, Take 1-2 capsules (100-200 mg total) by  mouth See admin instructions. 200 mg in the morning, 100 mg in the afternnon (Patient taking differently: Take 100-200 mg by mouth See admin instructions. Take 200 mg by mouth in the morning, 100 mg by mouth in the afternoon), Disp: 120 capsule, Rfl: 10   pregabalin (LYRICA) 300 MG capsule, Take 300 mg by mouth at bedtime., Disp: , Rfl:    promethazine (PHENERGAN) 25 MG tablet, Take 25 mg by mouth every 6 (six) hours as needed for vomiting or nausea., Disp: , Rfl:    rosuvastatin (CRESTOR) 40 MG tablet, Take 40 mg by mouth daily., Disp: , Rfl:    sacubitril-valsartan (ENTRESTO) 24-26 MG, Take 1 tablet  by mouth 2 (two) times daily., Disp: 180 tablet, Rfl: 3   torsemide (DEMADEX) 20 MG tablet, TAKE 2 TABLETS BY MOUTH DAILY (Patient taking differently: No sig reported), Disp: 180 tablet, Rfl: 3   tretinoin (RETIN-A) 0.05 % cream, Apply 1 application topically at bedtime., Disp: , Rfl:    dicyclomine (BENTYL) 10 MG capsule, Take 10 mg by mouth daily. (Patient not taking: Reported on 09/08/2021), Disp: , Rfl:    JENCYCLA 0.35 MG tablet, Take 1 tablet by mouth daily. (Patient not taking: Reported on 09/16/2021), Disp: , Rfl:  Allergies  Allergen Reactions   Ketamine Other (See Comments)    In vegetative state for 15 minutes per pt   [place] Clam (M. Mercenaria) Skin Test Itching, Other (See Comments) and Rash   Clarithromycin Other (See Comments)    Stomach pain    Fentanyl Nausea And Vomiting   M[place]ake Mushroom [Maitake] Itching    Itchy throat   Penicillin V Itching    Gi upset   Shellfish Allergy Swelling and Other (See Comments)    Throat swelling. Pt has never had shellfish but assumes it will cause swelling. Pt states contrast in CT is okay   Morphine Itching and Rash   Penicillins Rash    Has patient had a PCN reaction causing immediate rash, facial/tongue/throat swelling, SOB or lightheadedness with hypotension: Y Has patient had a PCN reaction causing severe rash involving mucus  membranes or skin necrosis: Y Has patient had a PCN reaction that required hospitalization: N Has patient had a PCN reaction occurring within the last 10 years: Y If all of the above answers are "NO", then may proceed with Cephalosporin use.    Prednisone Rash      Social History   Socioeconomic History   Marital status: Married    Spouse name: Not on file   Number of children: 0   Years of education: Not on file   Highest education level: Not on file  Occupational History   Occupation: [employer]  Tobacco Use   Smoking status: Never   Smokeless tobacco: Never  Vaping Use   Vaping Use: Never used  Substance and Sexual Activity   Alcohol use: Never   Drug use: Never   Sexual activity: Yes  Other Topics Concern   Not on file  Social History Narrative   Not on file   Social Determinants of Health   Financial Resource Strain: Medium Risk (08/19/2021)   Overall Financial Resource Strain (CARDIA)    Difficulty of Paying Living Expenses: Somewhat hard  Food Insecurity: Food Insecurity Present (08/19/2021)   Hunger Vital Sign    Worried About Running Out of Food in the Last Year: Sometimes true    Ran Out of Food in the Last Year: Never true  Transportation Needs: No Transportation Needs (08/19/2021)   PRAPARE - Hydrologist (Medical): No    Lack of Transportation (Non-Medical): No  Physical Activity: Not on file  Stress: Not on file  Social Connections: Not on file  Intimate Partner Violence: Not on file    Physical Exam      Future Appointments  Date Time Provider [place]  09/24/2021  1:40 PM [name], Shaune Pascal, MD [place] None       [name], [place] [facility] Paramedic  09/16/21

## 2021-09-22 ENCOUNTER — Other Ambulatory Visit: Payer: Self-pay | Admitting: Orthopedic Surgery

## 2021-09-23 ENCOUNTER — Other Ambulatory Visit (HOSPITAL_COMMUNITY): Payer: Self-pay

## 2021-09-23 NOTE — Progress Notes (Addendum)
Paramedicine Encounter    Patient ID: Jennifer [name], female    DOB: 1990/11/04, 31 y.o.   MRN: [id number]  Pt reports she has not felt good today, she has chest pressure, feels like she has congestion on her. [name] waking up with congestion. She reports fever and cough, but non-productive.  She reports weight up yesterday but back down today.  She reports h/a past few days and also pain around her nasal area.  Appetite decreased the past few days.  Pt denies v/d, but feels increased nausea.   She was about done filling her pill box when I arrived.  I doubel checked it for her, she was missing a few things so that was corrected.  She feels like this is more of a cold but last time she felt like this it was fluid. Her weight seems stable-she does go to see clinic tomor. She also has a cardio at [facility] for 2nd opinion next week.  We did a home covid test and it was negative.   Meds verfied and as noted above rechecked pill box to get it squared away.   Will see her next week.    CBG this AM-HIGH CBG afternoon-249 BP 107/62   Pulse 97   Resp 16   Wt 140 lb (63.5 kg)   SpO2 97%   BMI 30.30 kg/m  Weight yesterday-145  Last visit weight-140  Patient Care Team: Elinor Parkinson as PCP - General (Physician Assistant) Jacelyn Pi, MD as PCP - Endocrinology (Endocrinology)  Patient Active Problem List   Diagnosis Date Noted   Hyperosmolar hyperglycemic state (HHS) ([place]) 08/26/2021   Small intestinal bacterial overgrowth (SIBO) 08/26/2021   Lower extremity edema 08/26/2021   Hyperkalemia 08/14/2021   Hx of insulin dependent diabetes mellitus 08/14/2021   Acute kidney failure ([place]) 05/13/2021   Chest pain 03/10/2021   Acute on chronic combined systolic and diastolic CHF (congestive heart failure) ([place]) 02/09/2021   History of astrocytoma of brain 02/09/2021   Severe hyperglycemia due to diabetes mellitus ([place]) 01/25/2021   Diarrhea    Elevated transaminase level     Acute combined systolic and diastolic heart failure (HCC)    Nonischemic cardiomyopathy (HCC)    Acute decompensated heart failure (HCC)    Elevated liver enzymes    Acute renal failure superimposed on stage 3a chronic kidney disease ([place]) 11/26/2020   Abdominal pain 85/46/2703   Chronic systolic CHF (congestive heart failure) ([employer]) 11/23/2020   Essential hypertension 11/23/2020   Hypertriglyceridemia 11/23/2020   DM (diabetes mellitus) ([place]) 10/10/2018   Hypothyroidism 10/10/2018    Current Outpatient Medications:    albuterol (VENTOLIN HFA) 108 (90 Base) MCG/ACT inhaler, Inhale 1 puff into the lungs every 6 (six) hours as needed for wheezing or shortness of breath., Disp: , Rfl:    aspirin EC 81 MG EC tablet, Take 1 tablet (81 mg total) by mouth daily. Swallow whole. (Patient taking differently: Take 81 mg by mouth daily. Swallow whole.), Disp: 30 tablet, Rfl: 1   carvedilol (COREG) 3.125 MG tablet, Take 1 tablet (3.125 mg total) by mouth 2 (two) times daily with a meal., Disp: 60 tablet, Rfl: 6   doxycycline (VIBRA-TABS) 100 MG tablet, Take 100 mg by mouth 2 (two) times daily. Patient states she is taking this medication every day continues, Disp: , Rfl:    DULoxetine (CYMBALTA) 30 MG capsule, Take 30 mg by mouth at bedtime., Disp: , Rfl:    erythromycin ophthalmic ointment, Place 1 application.  into the right eye at bedtime., Disp: , Rfl:    Evolocumab (REPATHA) 140 MG/ML SOSY, Inject 140 mg into the skin See admin instructions. Inject 140 mg subcutaneously every other Wednesday evening, Disp: , Rfl:    fenofibrate 160 MG tablet, Take 1 tablet (160 mg total) by mouth daily., Disp: 30 tablet, Rfl: 1   fluticasone (FLONASE) 50 MCG/ACT nasal spray, Place 1 spray into both nostrils daily., Disp: , Rfl:    insulin regular human CONCENTRATED (HUMULIN R) 500 UNIT/ML injection, Inject 100-260 Units into the skin See admin instructions. 260 units 3 times daily with each meal, 100 units at bedtime  per patient, Disp: , Rfl:    ivabradine (CORLANOR) 7.5 MG TABS tablet, Take 1 tablet (7.5 mg total) by mouth 2 (two) times daily with a meal., Disp: 60 tablet, Rfl: 6   levothyroxine (SYNTHROID) 125 MCG tablet, Take 1 tablet (125 mcg total) by mouth daily at 6 (six) AM., Disp: 30 tablet, Rfl: 1   metolazone (ZAROXOLYN) 2.5 MG tablet, Take 1 tablet (2.5 mg total) by mouth once a week for 10 doses., Disp: 1 tablet, Rfl: 0   metroNIDAZOLE (FLAGYL) 250 MG tablet, Take 250 mg by mouth 3 (three) times daily. Patient states she is taking this medication everyday continues per patient, Disp: , Rfl:    Multiple Vitamin (MULTIVITAMIN WITH MINERALS) TABS tablet, Take 1 tablet by mouth daily., Disp: 30 tablet, Rfl: 1   nitroGLYCERIN (NITROSTAT) 0.4 MG SL tablet, Place 1 tablet (0.4 mg total) under the tongue every 5 (five) minutes as needed for up to 25 days for chest pain., Disp: 25 tablet, Rfl: 3   ondansetron (ZOFRAN) 4 MG tablet, Take 4 mg by mouth every 8 (eight) hours as needed for nausea or vomiting., Disp: , Rfl:    pantoprazole (PROTONIX) 40 MG tablet, Take 40 mg by mouth daily., Disp: , Rfl:    potassium chloride (KLOR-CON M) 20 MEQ tablet, Take 2 tablets (40 mEq total) by mouth once a week for 10 doses., Disp: 20 tablet, Rfl: 0   pregabalin (LYRICA) 100 MG capsule, Take 1-2 capsules (100-200 mg total) by mouth See admin instructions. 200 mg in the morning, 100 mg in the afternnon (Patient taking differently: Take 100-200 mg by mouth See admin instructions. Take 200 mg by mouth in the morning, 100 mg by mouth in the afternoon), Disp: 120 capsule, Rfl: 10   pregabalin (LYRICA) 300 MG capsule, Take 300 mg by mouth at bedtime., Disp: , Rfl:    promethazine (PHENERGAN) 25 MG tablet, Take 25 mg by mouth every 6 (six) hours as needed for vomiting or nausea., Disp: , Rfl:    rosuvastatin (CRESTOR) 40 MG tablet, Take 40 mg by mouth daily., Disp: , Rfl:    sacubitril-valsartan (ENTRESTO) 24-26 MG, Take 1 tablet  by mouth 2 (two) times daily., Disp: 180 tablet, Rfl: 3   torsemide (DEMADEX) 20 MG tablet, TAKE 2 TABLETS BY MOUTH DAILY (Patient taking differently: No sig reported), Disp: 180 tablet, Rfl: 3   tretinoin (RETIN-A) 0.05 % cream, Apply 1 application topically at bedtime., Disp: , Rfl:    cetirizine (ZYRTEC) 10 MG tablet, Take 10 mg by mouth daily. (Patient not taking: Reported on 09/23/2021), Disp: , Rfl:    dicyclomine (BENTYL) 10 MG capsule, Take 10 mg by mouth daily. (Patient not taking: Reported on 09/08/2021), Disp: , Rfl:    JENCYCLA 0.35 MG tablet, Take 1 tablet by mouth daily. (Patient not taking: Reported on 09/16/2021), Disp: ,  Rfl:    ofloxacin (OCUFLOX) 0.3 % ophthalmic solution, Place 1 drop into both eyes daily as needed (only take after eye injections per patient). (Patient not taking: Reported on 09/23/2021), Disp: , Rfl:  Allergies  Allergen Reactions   Ketamine Other (See Comments)    In vegetative state for 15 minutes per pt   [place] Clam ([place]) Skin Test Itching, Other (See Comments) and Rash   Clarithromycin Other (See Comments)    Stomach pain    Fentanyl Nausea And Vomiting   Maitake Mushroom [M[place]ake] Itching    Itchy throat   Penicillin V Itching    Gi upset   Shellfish Allergy Swelling and Other (See Comments)    Throat swelling. Pt has never had shellfish but assumes it will cause swelling. Pt states contrast in CT is okay   Morphine Itching and Rash   Penicillins Rash    Has patient had a PCN reaction causing immediate rash, facial/tongue/throat swelling, SOB or lightheadedness with hypotension: Y Has patient had a PCN reaction causing severe rash involving mucus membranes or skin necrosis: Y Has patient had a PCN reaction that required hospitalization: N Has patient had a PCN reaction occurring within the last 10 years: Y If all of the above answers are "NO", then may proceed with Cephalosporin use.    Prednisone Rash      Social History    Socioeconomic History   Marital status: Married    Spouse name: Not on file   Number of children: 0   Years of education: Not on file   Highest education level: Not on file  Occupational History   Occupation: [employer]  Tobacco Use   Smoking status: Never   Smokeless tobacco: Never  Vaping Use   Vaping Use: Never used  Substance and Sexual Activity   Alcohol use: Never   Drug use: Never   Sexual activity: Yes  Other Topics Concern   Not on file  Social History Narrative   Not on file   Social Determinants of Health   Financial Resource Strain: Medium Risk (08/19/2021)   Overall Financial Resource Strain (CARDIA)    Difficulty of Paying Living Expenses: Somewhat hard  Food Insecurity: Food Insecurity Present (08/19/2021)   Hunger Vital Sign    Worried About Running Out of Food in the Last Year: Sometimes true    Ran Out of Food in the Last Year: Never true  Transportation Needs: No Transportation Needs (08/19/2021)   PRAPARE - Hydrologist (Medical): No    Lack of Transportation (Non-Medical): No  Physical Activity: Not on file  Stress: Not on file  Social Connections: Not on file  Intimate Partner Violence: Not on file    Physical Exam      Future Appointments  Date Time Provider [place]  09/24/2021  1:40 PM [name], Shaune Pascal, MD [place] None       [name], [facility] Paramedic  09/23/21

## 2021-09-24 ENCOUNTER — Encounter (HOSPITAL_COMMUNITY): Payer: Self-pay | Admitting: Internal Medicine

## 2021-09-24 ENCOUNTER — Ambulatory Visit (HOSPITAL_COMMUNITY)
Admission: RE | Admit: 2021-09-24 | Discharge: 2021-09-24 | Disposition: A | Discharge status: Home / Self Care | Payer: BC Managed Care – PPO | Source: Ambulatory Visit | Attending: Internal Medicine | Admitting: Internal Medicine

## 2021-09-24 VITALS — BP 124/80 | HR 97 | Wt 151.2 lb

## 2021-09-24 DIAGNOSIS — Z79890 Hormone replacement therapy (postmenopausal): Secondary | ICD-10-CM | POA: Diagnosis not present

## 2021-09-24 DIAGNOSIS — E039 Hypothyroidism, unspecified: Secondary | ICD-10-CM | POA: Insufficient documentation

## 2021-09-24 DIAGNOSIS — E119 Type 2 diabetes mellitus without complications: Secondary | ICD-10-CM | POA: Insufficient documentation

## 2021-09-24 DIAGNOSIS — N179 Acute kidney failure, unspecified: Secondary | ICD-10-CM | POA: Diagnosis not present

## 2021-09-24 DIAGNOSIS — Z91148 Patient's other noncompliance with medication regimen for other reason: Secondary | ICD-10-CM | POA: Diagnosis not present

## 2021-09-24 DIAGNOSIS — Z5941 Food insecurity: Secondary | ICD-10-CM | POA: Diagnosis not present

## 2021-09-24 DIAGNOSIS — I3139 Other pericardial effusion (noninflammatory): Secondary | ICD-10-CM | POA: Diagnosis not present

## 2021-09-24 DIAGNOSIS — E781 Pure hyperglyceridemia: Secondary | ICD-10-CM | POA: Diagnosis not present

## 2021-09-24 DIAGNOSIS — Z7982 Long term (current) use of aspirin: Secondary | ICD-10-CM | POA: Insufficient documentation

## 2021-09-24 DIAGNOSIS — R1900 Intra-abdominal and pelvic swelling, mass and lump, unspecified site: Secondary | ICD-10-CM | POA: Insufficient documentation

## 2021-09-24 DIAGNOSIS — Z8719 Personal history of other diseases of the digestive system: Secondary | ICD-10-CM | POA: Insufficient documentation

## 2021-09-24 DIAGNOSIS — Z885 Allergy status to narcotic agent status: Secondary | ICD-10-CM | POA: Insufficient documentation

## 2021-09-24 DIAGNOSIS — Z8249 Family history of ischemic heart disease and other diseases of the circulatory system: Secondary | ICD-10-CM | POA: Diagnosis not present

## 2021-09-24 DIAGNOSIS — Z5986 Financial insecurity: Secondary | ICD-10-CM | POA: Diagnosis not present

## 2021-09-24 DIAGNOSIS — K861 Other chronic pancreatitis: Secondary | ICD-10-CM | POA: Diagnosis not present

## 2021-09-24 DIAGNOSIS — Z91018 Allergy to other foods: Secondary | ICD-10-CM | POA: Insufficient documentation

## 2021-09-24 DIAGNOSIS — Z880 Allergy status to penicillin: Secondary | ICD-10-CM | POA: Insufficient documentation

## 2021-09-24 DIAGNOSIS — I110 Hypertensive heart disease with heart failure: Secondary | ICD-10-CM | POA: Insufficient documentation

## 2021-09-24 DIAGNOSIS — Z833 Family history of diabetes mellitus: Secondary | ICD-10-CM | POA: Insufficient documentation

## 2021-09-24 DIAGNOSIS — K760 Fatty (change of) liver, not elsewhere classified: Secondary | ICD-10-CM | POA: Insufficient documentation

## 2021-09-24 DIAGNOSIS — E875 Hyperkalemia: Secondary | ICD-10-CM | POA: Insufficient documentation

## 2021-09-24 DIAGNOSIS — Z79899 Other long term (current) drug therapy: Secondary | ICD-10-CM | POA: Insufficient documentation

## 2021-09-24 DIAGNOSIS — I2510 Atherosclerotic heart disease of native coronary artery without angina pectoris: Secondary | ICD-10-CM | POA: Insufficient documentation

## 2021-09-24 DIAGNOSIS — E282 Polycystic ovarian syndrome: Secondary | ICD-10-CM | POA: Diagnosis not present

## 2021-09-24 DIAGNOSIS — Z91013 Allergy to seafood: Secondary | ICD-10-CM | POA: Insufficient documentation

## 2021-09-24 DIAGNOSIS — I071 Rheumatic tricuspid insufficiency: Secondary | ICD-10-CM | POA: Diagnosis not present

## 2021-09-24 DIAGNOSIS — I272 Pulmonary hypertension, unspecified: Secondary | ICD-10-CM | POA: Insufficient documentation

## 2021-09-24 DIAGNOSIS — Z888 Allergy status to other drugs, medicaments and biological substances status: Secondary | ICD-10-CM | POA: Insufficient documentation

## 2021-09-24 DIAGNOSIS — Z8349 Family history of other endocrine, nutritional and metabolic diseases: Secondary | ICD-10-CM | POA: Diagnosis not present

## 2021-09-24 DIAGNOSIS — I5022 Chronic systolic (congestive) heart failure: Secondary | ICD-10-CM

## 2021-09-24 MED ORDER — TORSEMIDE 20 MG PO TABS: 40.0000 mg | ORAL_TABLET | Freq: Two times a day (BID) | ORAL | 3 refills | Status: DC

## 2021-09-24 MED ORDER — FUROSCIX 80 MG/10ML ~~LOC~~ CTKT: 80.0000 mg | CARTRIDGE | SUBCUTANEOUS | Status: DC

## 2021-09-24 NOTE — Progress Notes (Signed)
Advanced Heart Failure Clinic Note   PCP: Sue Lush, PA-C HF Cardiologist: Dr. 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   Has been followed by Dr. Einar Gip for her HF.    Admitted 11/22/20 with ab pain and diarrhea. Ab CT concerning for vasculitis (no pancreatitis). Her TG were found to be  4030. She was started on an insulin/dextrose infusion. Cardiology and Nephrology were consulted after her creatinine and LFTs continued to rise and she began developing hypotension. Echo 11/28/20 EF was found to be 20-25% with moderate RV dysfunction and mild septal flattening. Started on DBA 2.5. AHF consulted for further management. PICC placed to follow CVP and Co-Ox. BP were high and milrinone added to augment diuresis. cMRI c/w prior myocarditis vs sarcoid (see below). Mod pericardial effusion also noted. LVEF 29%. RV normal. She underwent R/LHC showing single vessel RCA occlusion, RA 1, LVEDP 17, Fick CO 5.6/CI 3.88. She was able to be weaned off inotropes and GDMT titrated.     Post hospital follow up she was drinking >2L/day fluid and eating more salty foods, volume was up, ReDs 43%, instructed to increase lasix to 20 mg bid x 3 days. She followed up with [name] with [facility] 12/17/20 for a 2nd opinion. He placed LifeVest and switched beta blocker to Toprol XL.   Called after hours service with swelling, instructed to continue lasix 20 mg bid.   Seen in ED 12/25/20 for fall. She was standing at work and became dizzy. She fell backwards and hit her head. CT neg, labs showed K 5.7. She was given 1 dose of Lokelma. She denied palpitations.   Volume overloaded at follow up 01/06/21, lasix switched to torsemide. She had a LifeVest per Cardiology at [facility].   Echo (10/22):  EF up to 45-50%, grade II DD, RV ok. LifeVest off.   Treated in ED 10/22 for pancreatitis, then admitted 11/22 for hyperglycemia, did not meet criteria for DKA.  Hospitalization c/b hyponatremia, sodium 118. Concern for volume overload. Lasix restarted but SCr increased. Nephrology consulted and restarted gentle IVF and GDMT held for a couple of days. Recommendation for Tolvaptan, however patient declined and requested discharge with outpatient follow-up her MD at [facility]. Jardiance stopped at discharge, weight 129 lbs.    Seen back in ED a few days later with increased weight and abd distention. Given IV lasix in ED and torsemide increased to 40 bid.   AHF Clinic visit 11/22, her breathing had improved but she complained of abdominal swelling/bloating, not felt to be related to volume overload. Concern for possible gastroparesis. She was referred to GI. They recommended abdominal CT. This was completed 12/9 and was unremarkable. Ceruloplasmin level was also low, raising concern for possible Wilson's Disease. However had further w/u w/ [facility] and reports Wilson's disease was ruled out. Additional GI testing unremarkable.    Echo 12/22 EF 45%, RV normal.    Follow up 08/02/21, weight up 16 lbs, ReDs 27%. Arranged for RHC to better assess hemodynamics and see if abdominal symptoms related to right-sided HF due to high-output state or non-cardiac (see results below).    [place] 5/23 with elevated filling pressures and normal cardiac ouput. Mild to moderate mixed pulmonary hypertension with prominent v-waves in RA, suggestive of significant TR. Given IV lasix and  metolazone 2.5 added weekly to diuretic regimen.    RA = 12 prominent V waves RV = 47/14 PA = 48/25 (37) PCW = 25 Fick cardiac output/index = 4.4/2.8 PVR =3.7 WU Ao sat = 99% PA sat = 62%, 62%   Post cath follow up, volume up, REDs 43%. Torsemide increased and weekly metolazone added.   Admitted 5/23 with AKI and hyperkalemia. Given IVF,  insulin, calcium gluconate and Lokelma. Echo showed EF 45-50%, RV normal, mild-mod MR, RVSP 30. Only mild TR. Restarted on torsemide 40 bid (lower dose) and Entresto 49/51 bid. Arranged paramedicine, discharged home 143 lbs.   Follow up 6/23, several med discrepancies. Labs showed serum glucose 531, AKI and hyperkalemia and advised eval in ED. Admitted, GDMT held and hydrated with IVF. Discharged home on previous regimen on GDMT, weight 148 lbs.  Today she returns for HF follow up with her husband. Overall feeling poor. Has more abdominal swelling. More SOB with mild exertion, has to take frequent breaks with ADLs. Denies palpitations, cough, CP, dizziness,  or PND/Orthopnea. Appetite ok. No fever or chills. Weight at home 140 pounds. Taking all medications. Followed by paramedicine. Going to [facility] for 2nd Cardiology opinion.    ROS: All systems reviewed and negative except as per HPI.   Past Medical History:  Diagnosis Date   Brain tumor ([place]) 03/29/1995   astrocytoma   CHF (congestive heart failure) (HCC)    Cholesterosis    DM (diabetes mellitus) ([place]) 10/10/2018   Fatty liver    HTN (hypertension) 10/10/2018   Hypothyroidism 10/10/2018   Lipoprotein deficiency    Lung disease    longevity long term   Pancreatitis    Polycystic ovary syndrome     Current Outpatient Medications  Medication Sig Dispense Refill   albuterol (VENTOLIN HFA) 108 (90 Base) MCG/ACT inhaler Inhale 1-2 puffs into the lungs every 6 (six) hours as needed for wheezing or shortness of breath.     aspirin EC 81 MG EC tablet Take 1 tablet (81 mg total) by mouth daily. Swallow whole. 30 tablet 1   carvedilol (COREG) 3.125 MG tablet Take 1 tablet (3.125 mg total) by mouth 2 (two) times daily with a meal. 60 tablet 6   DULoxetine (CYMBALTA) 30 MG capsule Take 30 mg by mouth at bedtime.     Evolocumab (REPATHA) 140 MG/ML SOSY Inject 140 mg into the skin See admin instructions. Inject 140 mg subcutaneously every other  Wednesday evening     fenofibrate 160 MG tablet Take 1 tablet (160 mg total) by mouth daily. 30 tablet 1   insulin regular human CONCENTRATED (HUMULIN R) 500 UNIT/ML injection Inject 100-260 Units into the skin See admin instructions. 270 units 3 times daily with each meal, 100 units at bedtime per patient     ivabradine (CORLANOR) 7.5 MG TABS tablet Take 1 tablet (7.5 mg total) by mouth 2 (two) times daily with a meal. 60 tablet 6   levothyroxine (SYNTHROID) 125 MCG tablet Take 1 tablet (125 mcg total) by mouth daily at 6 (six) AM. 30 tablet 1   metolazone (ZAROXOLYN) 2.5 MG tablet Take 1 tablet (2.5 mg total) by mouth once a week for 10 doses. 1 tablet 0   Multiple Vitamin (MULTIVITAMIN WITH MINERALS) TABS tablet Take 1 tablet by mouth daily. 30 tablet 1   nitroGLYCERIN (NITROSTAT) 0.4 MG SL tablet Place 1 tablet (0.4 mg total) under the tongue every 5 (five) minutes as needed for up to 25 days for chest pain.  25 tablet 3   ofloxacin (OCUFLOX) 0.3 % ophthalmic solution Place 1 drop into both eyes daily as needed (only take after eye injections per patient).     ondansetron (ZOFRAN) 4 MG tablet Take 4 mg by mouth every 8 (eight) hours as needed for nausea or vomiting.     pantoprazole (PROTONIX) 40 MG tablet Take 40 mg by mouth daily.     potassium chloride (KLOR-CON M) 20 MEQ tablet Take 2 tablets (40 mEq total) by mouth once a week for 10 doses. 20 tablet 0   pregabalin (LYRICA) 100 MG capsule Take 1-2 capsules (100-200 mg total) by mouth See admin instructions. 200 mg in the morning, 100 mg in the afternnon (Patient taking differently: Take 100-300 mg by mouth daily as needed (pain).) 120 capsule 10   pregabalin (LYRICA) 300 MG capsule Take 300 mg by mouth at bedtime.     prochlorperazine (COMPAZINE) 5 MG tablet Take 5 mg by mouth every 6 (six) hours as needed for nausea or vomiting.     rosuvastatin (CRESTOR) 40 MG tablet Take 40 mg by mouth daily.     sacubitril-valsartan (ENTRESTO) 24-26 MG  Take 1 tablet by mouth 2 (two) times daily. 180 tablet 3   torsemide (DEMADEX) 20 MG tablet TAKE 2 TABLETS BY MOUTH DAILY 180 tablet 3   tretinoin (RETIN-A) 0.05 % cream Apply 1 application topically at bedtime.     No current facility-administered medications for this encounter.    Allergies  Allergen Reactions   Ketamine Other (See Comments)    In vegetative state for 15 minutes per pt   Fentanyl Nausea And Vomiting   Maitake Mushroom [M[place]ake] Itching    Itchy throat   Penicillin V Itching    Gi upset   Shellfish Allergy Other (See Comments)    Pt has never had shellfish but tested positive on allergy test. Pt states contrast in CT is okay   Morphine Itching and Rash   Penicillins Rash    Has patient had a PCN reaction causing immediate rash, facial/tongue/throat swelling, SOB or lightheadedness with hypotension: Y Has patient had a PCN reaction causing severe rash involving mucus membranes or skin necrosis: Y Has patient had a PCN reaction that required hospitalization: N Has patient had a PCN reaction occurring within the last 10 years: Y If all of the above answers are "NO", then may proceed with Cephalosporin use.    Prednisone Rash      Social History   Socioeconomic History   Marital status: Married    Spouse name: Not on file   Number of children: 0   Years of education: Not on file   Highest education level: Not on file  Occupational History   Occupation: [employer]  Tobacco Use   Smoking status: Never   Smokeless tobacco: Never  Vaping Use   Vaping Use: Never used  Substance and Sexual Activity   Alcohol use: Never   Drug use: Never   Sexual activity: Yes  Other Topics Concern   Not on file  Social History Narrative   Not on file   Social Determinants of Health   Financial Resource Strain: Medium Risk (08/19/2021)   Overall Financial Resource Strain (CARDIA)    Difficulty of Paying Living Expenses: Somewhat hard  Food Insecurity: Food Insecurity  Present (08/19/2021)   Hunger Vital Sign    Worried About Running Out of Food in the Last Year: Sometimes true    Ran Out of Food in the  Last Year: Never true  Transportation Needs: No Transportation Needs (08/19/2021)   PRAPARE - Hydrologist (Medical): No    Lack of Transportation (Non-Medical): No  Physical Activity: Not on file  Stress: Not on file  Social Connections: Not on file  Intimate Partner Violence: Not on file   Family History  Problem Relation Age of Onset   Diabetes Mother    Hypertension Mother    Hyperlipidemia Mother    Thyroid disease Mother    Hypertension Father    Diabetes Father    Pancreatic cancer Paternal Aunt    Pancreatic cancer Paternal Uncle    Colon cancer Neg Hx    Esophageal cancer Neg Hx    Stomach cancer Neg Hx    BP 124/80   Pulse 97   Wt 68.6 kg (151 lb 3.2 oz)   SpO2 98%   BMI 32.72 kg/m   Wt Readings from Last 3 Encounters:  09/24/21 68.6 kg (151 lb 3.2 oz)  09/23/21 63.5 kg (140 lb)  09/16/21 63.5 kg (140 lb)   PHYSICAL EXAM: General:  NAD. No resp difficulty HEENT: Normal Neck: Supple. JVP jaw. Carotids 2+ bilat; no bruits. No lymphadenopathy or thryomegaly appreciated. Cor: PMI nondisplaced. Regular rate & rhythm. No rubs, gallops or murmurs. Lungs: Clear Abdomen: +tender, + distended. No hepatosplenomegaly. No bruits or masses. Good bowel sounds. Extremities: No cyanosis, clubbing, rash, edema Neuro: Alert & oriented x 3, cranial nerves grossly intact. Moves all 4 extremities w/o difficulty. Affect pleasant.  ASSESSMENT & PLAN: Chronic Systolic Heart Failure - likely NICM. - Echo (1/22): EF 35-40% Mod MR - Echo (11/28/20): EF 20-25% with moderate RV dysfunction and mild septal flattening. Mod-severe TR - Rheum serologies negative, but Rheumatoid factor mildly elevated at 15.5 - cMRI  9/22 EF 29%c/w prior myocarditis vs sarcoid. May have delayed chemo cardiotoxcity.  - R/LHC with single vessel  RCA occlusion, RA 1, LVEDP 17, Fick CO 5.6/CI 3.88. - Echo (10/22): EF 45-50%, grade II DD, RV ok. - Echo 12/22: EF 45%, RV normal  - RHC 5/23 with elevated filling pressures and normal CO, RA 12 (with prominent v-waves), RV 47/14, PA 48/25 (37), PCW 25 - Echo 5/23 EF 45-50%, RV normal  - NYHA Class II-III. Fluid overloaded on exam, in setting of poor compliance w/ diuretics  - Will obtain insurance authorization for Furoscix, needs 4 days of Furoscix + torsemide 40 mg in evenings with extra 40 KCL. - When she is finished in Furoscix, resume torsemide 40 mg bid + metolazone once aw eek. - Continue Entresto 24/26 mg bid - Intolerant to Jardiance due to frequent GU infections  - Off spiro w/ hyperkalemia  - Continue Coreg 3.125 mg bid - Continue Corlanor 7.61m bid  - Encouraged TED hoses - BNP + BNP today  - May benefit from CardioMEMs placement to help w/ outpatient fluid monitoring. Start insurance approval process - Continue Paramedince.   2. Recent AKI/Hyperkalemia - check BMP today   3. CAD - Single vessel RCA occlusion (small co-dominant vessel) on cardiac cath 09/08. - Medical management.  - No s/s of ischemia - Continue aspirin + statin.   4.  Pulmonary hypertension - Mild to moderate on RHC 5/23. PVR 3.7 WU. - Can consider cautious trial of selective pulmonary vasodilators when better diuresed.   5. TR - Prominent v-waves in RA tracing on RHC 5/23 - Echo 5/23 shows only mild TR    6. Severe hyperTG - Due  to LPL deficiency w/ recent pancreatitis. - Continue fenofibrate + niacin  - Previously offered referral to [facility] for further management. She prefers to continue with her endocrinologist at [facility] who currently manages/follows.   7. Type 3c DM - Follows w/ endocrinology at [facility]. - 2/2 chronic pancreatitis   8. Abdominal swelling/bloating - Seen at [facility] (11/20) for IBS, per chart review. - Had penumatosis intestinalis. She was started on pancreatic enzyme  replacement. - EGD (10/21): normal, bx taken to assess for celiac. - CT abdomen (2/22) and RUQ US showed no obvious gallbladder stones. - HIDA (4/22): ended early due to tachypnea, but cystic duct and gallbladder appeared WNL. - CT abdomen 12/22 unremarkable  - suspect primarily fatty liver  9. Poor Med Compliance - now enrolled in paramedicine, appreciate their assistance  Follow up in 2-3 weeks with APP.   Ramona, FNP 09/24/21    Patient seen and examined with the above-signed Advanced Practice Provider and/or Housestaff. I personally reviewed laboratory data, imaging studies and relevant notes. I independently examined the patient and formulated the important aspects of the plan. I have edited the note to reflect any of my changes or salient points. I have personally discussed the plan with the patient and/or family.  She continues to struggle with volume overload in setting of dietary non-compliance.   Weight up more than 10 pounds after diuretics recently cut back. Has been requiring metolazone.   General:  Sitting in chair  No resp difficulty HEENT: normal Neck: supple. JVP to jaw Carotids 2+ bilat; no bruits. No lymphadenopathy or thryomegaly appreciated. Cor: PMI nondisplaced. Regular rate & rhythm. No rubs, gallops or murmurs. Lungs: clear Abdomen: soft, nontender, + distended. No hepatosplenomegaly. No bruits or masses. Good bowel sounds. Extremities: no cyanosis, clubbing, rash, 2+ edema Neuro: alert & orientedx3, cranial nerves grossly intact. moves all 4 extremities w/o difficulty. Affect pleasant  She continues to struggle with very labile volume status with multiple readmits both for volume overload and volume depletion. I think she needs CardioMems to follow more closely. Will also start Furoscix to help manage volume overload. Start Furoscix 80 daily x 4 days. Use torsemide at night. Once finished with Furoscix switch to torsemide 40 bid. F/u 2  weeks.  Total time spent 45 minutes. Over half that time spent discussing above.   Glori Bickers, MD  4:31 PM

## 2021-09-24 NOTE — Patient Instructions (Signed)
Medication Changes:  Your provider has order Furoscix for you. This is an on-body infuser that gives you a dose of Furosemide.   It will be shipped to your home, we have also provided you with 3 sample kits  [facility] [facility] will call you to discuss before shipping so, PLEASE answer unknown calls  For questions regarding the device call [facility] [facility] at [phone]  Ensure you write down the time you start your infusion so that if there is a problem you will know how long the infusion lasted  Use Furoscix only AS DIRECTED by our office  Dosing Directions:   Day 1= Furoscix 1 kit today   Day 2= Furoscix 1 kit in AM with potassium 40 meq (2 tabs), Torsemide 40 mg (2 tabs) in PM  **IF weight is down 10 lbs do not take anymore Furoscix and resume Torsemide 40 mg (2 tabs) Twice daily   Day 3= Furoscix 1 kit in AM  with potassium 40 meq (2 tabs),Torsemide 40 mg (2 tabs) in PM  Day 4= Furoscix 1 kit in AM with potassium 40 meq (2 tabs), Torsemide 40 mg (2 tabs) in PM  Day 5= back to Torsemide 40 mg (2 tabs) Twice daily    Lab Work:  Please go to [facility] today for lab work  Testing/Procedures:  None  Referrals:  None  Special Instructions // Education:  Do the following things EVERYDAY: Weigh yourself in the morning before breakfast. Write it down and keep it in a log. Take your medicines as prescribed Eat low salt foods--Limit salt (sodium) to 2000 mg per day.  Stay as active as you can everyday Limit all fluids for the day to less than 2 liters   Follow-Up in: 2 weeks  At the [facility], you and your health needs are our priority. We have a designated team specialized in the treatment of Heart Failure. This Care Team includes your primary Heart Failure Specialized Cardiologist (physician), Advanced Practice Providers (APPs- Physician Assistants and Nurse Practitioners), and Pharmacist who all work together to provide you with the care you need,  when you need it.   You may see any of the following providers on your designated Care Team at your next follow up:  Dr Glori Bickers Dr Haynes Kerns, NP Lyda Jester, [place] [facility] [place], [place] Audry Riles, PharmD   Please be sure to bring in all your medications bottles to every appointment.   Need to Contact us:  If you have any questions or concerns before your next appointment please send [place] a message through [place] or call our office at [phone].    TO LEAVE A MESSAGE FOR THE NURSE SELECT OPTION 2, PLEASE LEAVE A MESSAGE INCLUDING: YOUR NAME DATE OF BIRTH CALL BACK NUMBER REASON FOR CALL**this is important as we prioritize the call backs  YOU WILL RECEIVE A CALL BACK THE SAME DAY AS LONG AS YOU CALL BEFORE 4:00 PM

## 2021-09-24 NOTE — Progress Notes (Signed)
Provided patient education on Furoscix using demo kits and Furoscix video, QR code provided on AVS for further viewing. Furoscix order form completed and signed by Dr Haroldine Laws. Order form, ins info, & OV note all faxed into [facility]. Pt provided sample kits as well.  Medication Samples have been provided to the patient.  Drug name: Furoscix       Strength: 5m        Qty: 3  LOT:: [id number] Exp.Date: 08/12/22  Dosing instructions: Use 1 kit daily for 4 days  The patient has been instructed regarding the correct time, dose, and frequency of taking this medication, including desired effects and most common side effects.   Jennifer [name] 3:20 PM 09/24/2021

## 2021-09-27 ENCOUNTER — Other Ambulatory Visit: Payer: Self-pay | Admitting: Internal Medicine

## 2021-09-27 ENCOUNTER — Telehealth (HOSPITAL_COMMUNITY): Payer: Self-pay

## 2021-09-27 DIAGNOSIS — K760 Fatty (change of) liver, not elsewhere classified: Secondary | ICD-10-CM | POA: Diagnosis present

## 2021-09-27 NOTE — Telephone Encounter (Signed)
Follow up call to patient about how she is feeling after doing Furoscix . Pt states she messed up one and 2nd kit fell off half way through. Has only completed 1. Rudi Heap NP made aware. Wants patient to complete 2 doses of Furoscix.Verified that patient is taking Torsemide in the evening as previously directed. Pt to come to clinic to pick up doses as she has none left. Pt's weight unchanged.

## 2021-09-27 NOTE — Telephone Encounter (Signed)
Called patient to see how she is feeling after doing Furoscix. Pt states she messed up 1 kit and 2nd kit fell off after half way through Patient did 1 kit. Spoke to J.Milford NP wants her to do 2 more kits. Verified that she is taking evening Torsemide as directed. Pt to come to clinic to get 2 more doses of Furoscix as patient doesn't have any. Pt's weight unchanged.

## 2021-09-29 LAB — BASIC METABOLIC PANEL
BUN/Creatinine Ratio: 27 — ABNORMAL HIGH (ref 9–23)
BUN: 61 mg/dL — ABNORMAL HIGH (ref 6–20)
CO2: 18 mmol/L — ABNORMAL LOW (ref 20–29)
Calcium: 9.3 mg/dL (ref 8.7–10.2)
Chloride: 103 mmol/L (ref 96–106)
Creatinine, Ser: 2.24 mg/dL — ABNORMAL HIGH (ref 0.57–1.00)
Glucose: 77 mg/dL (ref 70–99)
Potassium: 4.4 mmol/L (ref 3.5–5.2)
Sodium: 141 mmol/L (ref 134–144)
eGFR: 30 mL/min/{1.73_m2} — ABNORMAL LOW (ref >59)

## 2021-09-29 LAB — BRAIN NATRIURETIC PEPTIDE: BNP: 5.6 pg/mL (ref 0.0–100.0)

## 2021-09-30 ENCOUNTER — Other Ambulatory Visit (HOSPITAL_COMMUNITY): Payer: Self-pay

## 2021-09-30 NOTE — Progress Notes (Signed)
Paramedicine Encounter    Patient ID: Jennifer [name], female    DOB: 1991/02/29, 31 y.o.   MRN: [id number]  Pt reports she is feeling better.  She went to clinic last Friday and was put on furoscix.  She has completed 3 kits. [name] awaiting arrival for 2 more kits.  She said it would be arriving to her home. She feels like she doesn't drink as much with the furoscix like she does with the torsemide.   She does feel better.  She also went to get 2nd opinion at [facility]. She goes back there in oct.  She does still have fluid on her but its slowly coming down.  Meds verified and pill box checked.  There was some errors in there so that was fixed.  Will f/u next week.   BP (!) 142/88   Pulse 98   Resp 16   Wt 145 lb (65.8 kg)   SpO2 98%   BMI 31.38 kg/m  Weight yesterday-145 Last visit weight-140  Patient Care Team: Elinor Parkinson as PCP - General (Physician Assistant) Jacelyn Pi, MD as PCP - Endocrinology (Endocrinology)  Patient Active Problem List   Diagnosis Date Noted   Hyperosmolar hyperglycemic state (HHS) ([place]) 08/26/2021   Small intestinal bacterial overgrowth (SIBO) 08/26/2021   Lower extremity edema 08/26/2021   Hyperkalemia 08/14/2021   Hx of insulin dependent diabetes mellitus 08/14/2021   Acute kidney failure ([place]) 05/13/2021   Chest pain 03/10/2021   Acute on chronic combined systolic and diastolic CHF (congestive heart failure) ([place]) 02/09/2021   History of astrocytoma of brain 02/09/2021   Severe hyperglycemia due to diabetes mellitus ([place]) 01/25/2021   Diarrhea    Elevated transaminase level    Acute combined systolic and diastolic heart failure (HCC)    Nonischemic cardiomyopathy (HCC)    Acute decompensated heart failure (HCC)    Elevated liver enzymes    Acute renal failure superimposed on stage 3a chronic kidney disease ([place]) 11/26/2020   Abdominal pain 85/46/2703   Chronic systolic CHF (congestive heart failure) ([place]) 11/23/2020    Essential hypertension 11/23/2020   Hypertriglyceridemia 11/23/2020   DM (diabetes mellitus) ([place]) 10/10/2018   Hypothyroidism 10/10/2018    Current Outpatient Medications:    albuterol (VENTOLIN HFA) 108 (90 Base) MCG/ACT inhaler, Inhale 1-2 puffs into the lungs every 6 (six) hours as needed for wheezing or shortness of breath., Disp: , Rfl:    aspirin EC 81 MG EC tablet, Take 1 tablet (81 mg total) by mouth daily. Swallow whole. (Patient taking differently: Take 81 mg by mouth daily. Swallow whole. Takes it at bedtime due to making her feel drowsy.), Disp: 30 tablet, Rfl: 1   carvedilol (COREG) 3.125 MG tablet, Take 1 tablet (3.125 mg total) by mouth 2 (two) times daily with a meal., Disp: 60 tablet, Rfl: 6   DULoxetine (CYMBALTA) 30 MG capsule, Take 30 mg by mouth at bedtime., Disp: , Rfl:    Evolocumab (REPATHA) 140 MG/ML SOSY, Inject 140 mg into the skin See admin instructions. Inject 140 mg subcutaneously every other Wednesday evening, Disp: , Rfl:    fenofibrate 160 MG tablet, Take 1 tablet (160 mg total) by mouth daily., Disp: 30 tablet, Rfl: 1   Furosemide (FUROSCIX) 80 MG/10ML CTKT, Inject 80 mg into the skin as directed., Disp: , Rfl:    insulin regular human CONCENTRATED (HUMULIN R) 500 UNIT/ML injection, Inject 100-260 Units into the skin See admin instructions. 270 units 3 times daily with each  meal, 100 units at bedtime per patient, Disp: , Rfl:    ivabradine (CORLANOR) 7.5 MG TABS tablet, Take 1 tablet (7.5 mg total) by mouth 2 (two) times daily with a meal., Disp: 60 tablet, Rfl: 6   levothyroxine (SYNTHROID) 125 MCG tablet, Take 1 tablet (125 mcg total) by mouth daily at 6 (six) AM., Disp: 30 tablet, Rfl: 1   metolazone (ZAROXOLYN) 2.5 MG tablet, Take 1 tablet (2.5 mg total) by mouth once a week for 10 doses., Disp: 1 tablet, Rfl: 0   Multiple Vitamin (MULTIVITAMIN WITH MINERALS) TABS tablet, Take 1 tablet by mouth daily., Disp: 30 tablet, Rfl: 1   nitroGLYCERIN (NITROSTAT) 0.4  MG SL tablet, Place 1 tablet (0.4 mg total) under the tongue every 5 (five) minutes as needed for up to 25 days for chest pain., Disp: 25 tablet, Rfl: 3   ofloxacin (OCUFLOX) 0.3 % ophthalmic solution, Place 1 drop into both eyes daily as needed (only take after eye injections per patient)., Disp: , Rfl:    ondansetron (ZOFRAN) 4 MG tablet, Take 4 mg by mouth every 8 (eight) hours as needed for nausea or vomiting., Disp: , Rfl:    pantoprazole (PROTONIX) 40 MG tablet, Take 40 mg by mouth daily., Disp: , Rfl:    potassium chloride (KLOR-CON M) 20 MEQ tablet, Take 2 tablets (40 mEq total) by mouth once a week for 10 doses., Disp: 20 tablet, Rfl: 0   pregabalin (LYRICA) 100 MG capsule, Take 1-2 capsules (100-200 mg total) by mouth See admin instructions. 200 mg in the morning, 100 mg in the afternnon (Patient taking differently: Take 100-300 mg by mouth daily as needed (pain).), Disp: 120 capsule, Rfl: 10   pregabalin (LYRICA) 300 MG capsule, Take 300 mg by mouth at bedtime., Disp: , Rfl:    prochlorperazine (COMPAZINE) 5 MG tablet, Take 5 mg by mouth every 6 (six) hours as needed for nausea or vomiting., Disp: , Rfl:    rosuvastatin (CRESTOR) 40 MG tablet, Take 40 mg by mouth daily., Disp: , Rfl:    sacubitril-valsartan (ENTRESTO) 24-26 MG, Take 1 tablet by mouth 2 (two) times daily., Disp: 180 tablet, Rfl: 3   torsemide (DEMADEX) 20 MG tablet, Take 2 tablets (40 mg total) by mouth 2 (two) times daily., Disp: 120 tablet, Rfl: 3   tretinoin (RETIN-A) 0.05 % cream, Apply 1 application topically at bedtime., Disp: , Rfl:  Allergies  Allergen Reactions   Ketamine Other (See Comments)    In vegetative state for 15 minutes per pt   Fentanyl Nausea And Vomiting   Maitake Mushroom [Maitake] Itching    Itchy throat   Penicillin V Itching    Gi upset   Shellfish Allergy Other (See Comments)    Pt has never had shellfish but tested positive on allergy test. Pt states contrast in CT is okay   Morphine  Itching and Rash   Penicillins Rash    Has patient had a PCN reaction causing immediate rash, facial/tongue/throat swelling, SOB or lightheadedness with hypotension: Y Has patient had a PCN reaction causing severe rash involving mucus membranes or skin necrosis: Y Has patient had a PCN reaction that required hospitalization: N Has patient had a PCN reaction occurring within the last 10 years: Y If all of the above answers are "NO", then may proceed with Cephalosporin use.    Prednisone Rash      Social History   Socioeconomic History   Marital status: Married    Spouse name: Not  on file   Number of children: 0   Years of education: Not on file   Highest education level: Not on file  Occupational History   Occupation: [employer]  Tobacco Use   Smoking status: Never   Smokeless tobacco: Never  Vaping Use   Vaping Use: Never used  Substance and Sexual Activity   Alcohol use: Never   Drug use: Never   Sexual activity: Yes  Other Topics Concern   Not on file  Social History Narrative   Not on file   Social Determinants of Health   Financial Resource Strain: Medium Risk (08/19/2021)   Overall Financial Resource Strain (CARDIA)    Difficulty of Paying Living Expenses: Somewhat hard  Food Insecurity: Food Insecurity Present (08/19/2021)   Hunger Vital Sign    Worried About Running Out of Food in the Last Year: Sometimes true    Ran Out of Food in the Last Year: Never true  Transportation Needs: No Transportation Needs (08/19/2021)   PRAPARE - Hydrologist (Medical): No    Lack of Transportation (Non-Medical): No  Physical Activity: Not on file  Stress: Not on file  Social Connections: Not on file  Intimate Partner Violence: Not on file    Physical Exam      Future Appointments  Date Time Provider [place]  10/06/2021  2:30 PM Mull[place], [place] Paramedic  09/30/21

## 2021-10-01 ENCOUNTER — Ambulatory Visit: Admit: 2021-10-01 | Payer: BC Managed Care – PPO | Admitting: Orthopedic Surgery

## 2021-10-01 SURGERY — RELEASE, A1 PULLEY, FOR TRIGGER FINGER
Anesthesia: Monitor Anesthesia Care | Laterality: Right

## 2021-10-06 ENCOUNTER — Other Ambulatory Visit (HOSPITAL_COMMUNITY): Payer: Self-pay

## 2021-10-06 ENCOUNTER — Encounter (HOSPITAL_COMMUNITY): Payer: Self-pay

## 2021-10-06 ENCOUNTER — Ambulatory Visit (HOSPITAL_COMMUNITY)
Admission: RE | Admit: 2021-10-06 | Discharge: 2021-10-06 | Disposition: A | Discharge status: Home / Self Care | Payer: BC Managed Care – PPO | Source: Ambulatory Visit | Attending: Family Medicine | Admitting: Family Medicine

## 2021-10-06 VITALS — BP 106/62 | HR 98 | Wt 150.2 lb

## 2021-10-06 DIAGNOSIS — Z7982 Long term (current) use of aspirin: Secondary | ICD-10-CM | POA: Diagnosis not present

## 2021-10-06 DIAGNOSIS — I110 Hypertensive heart disease with heart failure: Secondary | ICD-10-CM | POA: Insufficient documentation

## 2021-10-06 DIAGNOSIS — I3139 Other pericardial effusion (noninflammatory): Secondary | ICD-10-CM | POA: Diagnosis not present

## 2021-10-06 DIAGNOSIS — Z91148 Patient's other noncompliance with medication regimen for other reason: Secondary | ICD-10-CM | POA: Insufficient documentation

## 2021-10-06 DIAGNOSIS — R1900 Intra-abdominal and pelvic swelling, mass and lump, unspecified site: Secondary | ICD-10-CM | POA: Insufficient documentation

## 2021-10-06 DIAGNOSIS — E860 Dehydration: Secondary | ICD-10-CM | POA: Insufficient documentation

## 2021-10-06 DIAGNOSIS — Z7901 Long term (current) use of anticoagulants: Secondary | ICD-10-CM | POA: Diagnosis not present

## 2021-10-06 DIAGNOSIS — Z794 Long term (current) use of insulin: Secondary | ICD-10-CM | POA: Diagnosis not present

## 2021-10-06 DIAGNOSIS — N179 Acute kidney failure, unspecified: Secondary | ICD-10-CM | POA: Diagnosis not present

## 2021-10-06 DIAGNOSIS — Z8719 Personal history of other diseases of the digestive system: Secondary | ICD-10-CM | POA: Insufficient documentation

## 2021-10-06 DIAGNOSIS — I2510 Atherosclerotic heart disease of native coronary artery without angina pectoris: Secondary | ICD-10-CM | POA: Insufficient documentation

## 2021-10-06 DIAGNOSIS — K760 Fatty (change of) liver, not elsewhere classified: Secondary | ICD-10-CM | POA: Diagnosis not present

## 2021-10-06 DIAGNOSIS — Z7984 Long term (current) use of oral hypoglycemic drugs: Secondary | ICD-10-CM | POA: Insufficient documentation

## 2021-10-06 DIAGNOSIS — I5022 Chronic systolic (congestive) heart failure: Secondary | ICD-10-CM

## 2021-10-06 DIAGNOSIS — I272 Pulmonary hypertension, unspecified: Secondary | ICD-10-CM | POA: Insufficient documentation

## 2021-10-06 DIAGNOSIS — E119 Type 2 diabetes mellitus without complications: Secondary | ICD-10-CM | POA: Insufficient documentation

## 2021-10-06 DIAGNOSIS — E781 Pure hyperglyceridemia: Secondary | ICD-10-CM | POA: Insufficient documentation

## 2021-10-06 LAB — BASIC METABOLIC PANEL
Anion gap: 19 — ABNORMAL HIGH (ref 5–15)
BUN: 79 mg/dL — ABNORMAL HIGH (ref 6–20)
CO2: 21 mmol/L — ABNORMAL LOW (ref 22–32)
Calcium: 8.8 mg/dL — ABNORMAL LOW (ref 8.9–10.3)
Chloride: 93 mmol/L — ABNORMAL LOW (ref 98–111)
Creatinine, Ser: 3.61 mg/dL — ABNORMAL HIGH (ref 0.44–1.00)
GFR, Estimated: 17 mL/min — ABNORMAL LOW (ref >60)
Glucose, Bld: 298 mg/dL — ABNORMAL HIGH (ref 70–99)
Potassium: 4.7 mmol/L (ref 3.5–5.1)
Sodium: 133 mmol/L — ABNORMAL LOW (ref 135–145)

## 2021-10-06 LAB — BRAIN NATRIURETIC PEPTIDE: B Natriuretic Peptide: 7.9 pg/mL (ref 0.0–100.0)

## 2021-10-06 NOTE — Progress Notes (Signed)
Paramedicine Encounter   Patient ID: [name] , female,   DOB: [birth year],31 y.o.,  MRN: [id number]   Met patient in clinic today with provider.  Pt reports she feels blaoted. Her [facility] cardio suggested farxiga-due to her diabetes it is not recommended for her to take that.  She had surgery on her hand on Friday. She reports pain stil but not enough to have to take pain meds.  She got 3 doses of the furoscix and completed those.  She gets sob at doing everything.   Weight at home 145-147 She carries her fluid in her gut-which makes it more difficult for body to respond to the oral diuretics.   She went to ob/gyn today and they told her it may be pre-menopause or PCOS, she goes to have uterine US next month.   Labs done today.   --per lab results-hold torsemide and metolazone for now--resume torsemide 68m BID on Saturday-no metolazone this week-come back for labs on Monday.   Weight @ clinic-150 B/P-106/62 P-98 SP02-100 REDS CLIP-41%  Med changes-waiting for lab results before determining that  -furoscix kit is added today.  Likely will increased her torsemide to 680mBID.    KaMarylouise StacksEMNorcross/02/2022

## 2021-10-06 NOTE — Progress Notes (Signed)
Advanced Heart Failure Clinic Note   PCP: Sue Lush, PA-C HF Cardiologist: Dr. 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   Has been followed by Dr. Einar Gip for her HF.    Admitted 11/22/20 with ab pain and diarrhea. Ab CT concerning for vasculitis (no pancreatitis). Her TG were found to be  4030. She was started on an insulin/dextrose infusion. Cardiology and Nephrology were consulted after her creatinine and LFTs continued to rise and she began developing hypotension. Echo 11/28/20 EF was found to be 20-25% with moderate RV dysfunction and mild septal flattening. Started on DBA 2.5. AHF consulted for further management. PICC placed to follow CVP and Co-Ox. BP were high and milrinone added to augment diuresis. cMRI c/w prior myocarditis vs sarcoid (see below). Mod pericardial effusion also noted. LVEF 29%. RV normal. She underwent R/LHC showing single vessel RCA occlusion, RA 1, LVEDP 17, Fick CO 5.6/CI 3.88. She was able to be weaned off inotropes and GDMT titrated.     Post hospital follow up she was drinking >2L/day fluid and eating more salty foods, volume was up, ReDs 43%, instructed to increase lasix to 20 mg bid x 3 days. She followed up with [name] with [facility] 12/17/20 for a 2nd opinion. He placed LifeVest and switched beta blocker to Toprol XL.   Called after hours service with swelling, instructed to continue lasix 20 mg bid.   Seen in ED 12/25/20 for fall. She was standing at work and became dizzy. She fell backwards and hit her head. CT neg, labs showed K 5.7. She was given 1 dose of Lokelma. She denied palpitations.   Volume overloaded at follow up 01/06/21, lasix switched to torsemide. She had a LifeVest per Cardiology at [facility].   Echo (10/22):  EF up to 45-50%, grade II DD, RV ok. LifeVest off.   Treated in ED 10/22 for pancreatitis, then admitted 11/22 for hyperglycemia, did not meet criteria for DKA.  Hospitalization c/b hyponatremia, sodium 118. Concern for volume overload. Lasix restarted but SCr increased. Nephrology consulted and restarted gentle IVF and GDMT held for a couple of days. Recommendation for Tolvaptan, however patient declined and requested discharge with outpatient follow-up her MD at [name]. Jardiance stopped at discharge, weight 129 lbs.    Seen back in ED a few days later with increased weight and abd distention. Given IV lasix in ED and torsemide increased to 40 bid.   [facility] Clinic visit 11/22, her breathing had improved but she complained of abdominal swelling/bloating, not felt to be related to volume overload. Concern for possible gastroparesis. She was referred to GI. They recommended abdominal CT. This was completed 12/9 and was unremarkable. Ceruloplasmin level was also low, raising concern for possible Wilson's Disease. However had further w/u w/ [facility] and reports Wilson's disease was ruled out. Additional GI testing unremarkable.    Echo 12/22 EF 45%, RV normal.    Follow up 08/02/21, weight up 16 lbs, ReDs 27%. Arranged for RHC to better assess hemodynamics and see if abdominal symptoms related to right-sided HF due to high-output state or non-cardiac (see results below).    [place] 5/23 with elevated filling pressures and normal cardiac ouput. Mild to moderate mixed pulmonary hypertension with prominent v-waves in RA, suggestive of significant TR. Given IV lasix and  metolazone 2.5 added weekly to diuretic regimen.    RA = 12 prominent V waves RV = 47/14 PA = 48/25 (37) PCW = 25 Fick cardiac output/index = 4.4/2.8 PVR =3.7 WU Ao sat = 99% PA sat = 62%, 62%   Post cath follow up, volume up, REDs 43%. Torsemide increased and weekly metolazone added.   Admitted 5/23 with AKI and hyperkalemia. Given IVF,  insulin, calcium gluconate and Lokelma. Echo showed EF 45-50%, RV normal, mild-mod MR, RVSP 30. Only mild TR. Restarted on torsemide 40 bid (lower dose) and Entresto 49/51 bid. Arranged paramedicine, discharged home 143 lbs.   Follow up 6/23, several med discrepancies. Labs showed serum glucose 531, AKI and hyperkalemia and advised eval in ED. Admitted, GDMT held and hydrated with IVF. Discharged home on previous regimen on GDMT, weight 148 lbs.  Returned for f/u on 6/30. Overall feeling poorly. Noted more abdominal swelling and more SOB with mild exertion. Felt to be fluid overloaded. Treated w/ Furoscix 80 mg daily x 4 days, w/ instruction to switch back to torsemide 40 mg bid thereafter and to return in 2 wks for f/u.   She also went to [facility] for 2nd Cardiology opinion. Seen 7/3. Also felt to be congested. [facility] recommended increasing torsemide to 60 mg bid and instructed f/u in 3 months.   She presents today for f/u. Here w/ husband and para medicine Veterinary surgeon). Reports continued abdominal edema. Initially improved w/ Furoscix but swelling returned after switch back to PO diuretics. Reports full compliance w/ torsemide but suboptimal urinary response. She reports she has continued w/ 40 mg bid dosing and did not increase to 60 bid as advised by [facility] HF team. BP 106/62. Denies orthostatic symptoms. Chronic NYHA Class II symptoms.   BMP obtained in clinic today and c/w dehydration. Scr elevted 3.61 (up from 2.24), BUN 79. BNP 7.9.    ROS: All systems reviewed and negative except as per HPI.   Past Medical History:  Diagnosis Date   Brain tumor ([place]) 03/29/1995   astrocytoma   CHF (congestive heart failure) (HCC)    Cholesterosis    DM (diabetes mellitus) ([place]) 10/10/2018   Fatty liver    HTN (hypertension) 10/10/2018   Hypothyroidism 10/10/2018   Lipoprotein deficiency    Lung disease    longevity long term   Pancreatitis    Polycystic ovary syndrome     Current Outpatient Medications   Medication Sig Dispense Refill   albuterol (VENTOLIN HFA) 108 (90 Base) MCG/ACT inhaler Inhale 1-2 puffs into the lungs every 6 (six) hours as needed for wheezing or shortness of breath.     aspirin EC 81 MG EC tablet Take 1 tablet (81 mg total) by mouth daily. Swallow whole. (Patient taking differently: Take 81 mg by mouth daily. Swallow whole. Takes it at bedtime due to making her feel drowsy.) 30 tablet 1   carvedilol (COREG) 3.125 MG tablet Take 1 tablet (3.125 mg total) by mouth 2 (two) times daily with a meal. 60 tablet 6   DULoxetine (CYMBALTA) 30 MG capsule Take 30 mg by mouth at bedtime.     Evolocumab (REPATHA) 140 MG/ML SOSY Inject 140 mg into the skin See admin instructions. Inject 140 mg subcutaneously every other Wednesday evening     fenofibrate 160 MG tablet Take 1 tablet (160 mg total) by mouth daily. 30 tablet 1   Furosemide (FUROSCIX) 80 MG/10ML CTKT Inject 80 mg into the skin as directed.     insulin regular human  CONCENTRATED (HUMULIN R) 500 UNIT/ML injection Inject 100-260 Units into the skin See admin instructions. 270 units 3 times daily with each meal, 100 units at bedtime per patient     ivabradine (CORLANOR) 7.5 MG TABS tablet Take 1 tablet (7.5 mg total) by mouth 2 (two) times daily with a meal. 60 tablet 6   levothyroxine (SYNTHROID) 125 MCG tablet Take 1 tablet (125 mcg total) by mouth daily at 6 (six) AM. 30 tablet 1   metolazone (ZAROXOLYN) 2.5 MG tablet Take 1 tablet (2.5 mg total) by mouth once a week for 10 doses. 1 tablet 0   Multiple Vitamin (MULTIVITAMIN WITH MINERALS) TABS tablet Take 1 tablet by mouth daily. 30 tablet 1   nitroGLYCERIN (NITROSTAT) 0.4 MG SL tablet Place 1 tablet (0.4 mg total) under the tongue every 5 (five) minutes as needed for up to 25 days for chest pain. 25 tablet 3   ofloxacin (OCUFLOX) 0.3 % ophthalmic solution Place 1 drop into both eyes daily as needed (only take after eye injections per patient).     ondansetron (ZOFRAN) 4 MG tablet  Take 4 mg by mouth every 8 (eight) hours as needed for nausea or vomiting.     pantoprazole (PROTONIX) 40 MG tablet Take 40 mg by mouth daily.     potassium chloride (KLOR-CON M) 20 MEQ tablet Take 2 tablets (40 mEq total) by mouth once a week for 10 doses. 20 tablet 0   pregabalin (LYRICA) 100 MG capsule Take 1-2 capsules (100-200 mg total) by mouth See admin instructions. 200 mg in the morning, 100 mg in the afternnon (Patient taking differently: Take 100-300 mg by mouth daily as needed (pain).) 120 capsule 10   pregabalin (LYRICA) 300 MG capsule Take 300 mg by mouth at bedtime.     prochlorperazine (COMPAZINE) 5 MG tablet Take 5 mg by mouth every 6 (six) hours as needed for nausea or vomiting.     rosuvastatin (CRESTOR) 40 MG tablet Take 40 mg by mouth daily.     sacubitril-valsartan (ENTRESTO) 24-26 MG Take 1 tablet by mouth 2 (two) times daily. 180 tablet 3   torsemide (DEMADEX) 20 MG tablet Take 2 tablets (40 mg total) by mouth 2 (two) times daily. 120 tablet 3   tretinoin (RETIN-A) 0.05 % cream Apply 1 application topically at bedtime.     No current facility-administered medications for this encounter.    Allergies  Allergen Reactions   Ketamine Other (See Comments)    In vegetative state for 15 minutes per pt   Fentanyl Nausea And Vomiting   Maitake Mushroom [Maitake] Itching    Itchy throat   Penicillin V Itching    Gi upset   Shellfish Allergy Other (See Comments)    Pt has never had shellfish but tested positive on allergy test. Pt states contrast in CT is okay   Morphine Itching and Rash   Penicillins Rash    Has patient had a PCN reaction causing immediate rash, facial/tongue/throat swelling, SOB or lightheadedness with hypotension: Y Has patient had a PCN reaction causing severe rash involving mucus membranes or skin necrosis: Y Has patient had a PCN reaction that required hospitalization: N Has patient had a PCN reaction occurring within the last 10 years: Y If all of  the above answers are "NO", then may proceed with Cephalosporin use.    Prednisone Rash      Social History   Socioeconomic History   Marital status: Married    Spouse name: Not  on file   Number of children: 0   Years of education: Not on file   Highest education level: Not on file  Occupational History   Occupation: [employer]  Tobacco Use   Smoking status: Never   Smokeless tobacco: Never  Vaping Use   Vaping Use: Never used  Substance and Sexual Activity   Alcohol use: Never   Drug use: Never   Sexual activity: Yes  Other Topics Concern   Not on file  Social History Narrative   Not on file   Social Determinants of Health   Financial Resource Strain: Medium Risk (08/19/2021)   Overall Financial Resource Strain (CARDIA)    Difficulty of Paying Living Expenses: Somewhat hard  Food Insecurity: Food Insecurity Present (08/19/2021)   Hunger Vital Sign    Worried About Running Out of Food in the Last Year: Sometimes true    Ran Out of Food in the Last Year: Never true  Transportation Needs: No Transportation Needs (08/19/2021)   PRAPARE - Hydrologist (Medical): No    Lack of Transportation (Non-Medical): No  Physical Activity: Not on file  Stress: Not on file  Social Connections: Not on file  Intimate Partner Violence: Not on file   Family History  Problem Relation Age of Onset   Diabetes Mother    Hypertension Mother    Hyperlipidemia Mother    Thyroid disease Mother    Hypertension Father    Diabetes Father    Pancreatic cancer Paternal Aunt    Pancreatic cancer Paternal Uncle    Colon cancer Neg Hx    Esophageal cancer Neg Hx    Stomach cancer Neg Hx    BP 106/62   Pulse 98   Wt 68.1 kg (150 lb 3.2 oz)   SpO2 100%   BMI 32.50 kg/m   Wt Readings from Last 3 Encounters:  10/06/21 68.1 kg (150 lb 3.2 oz)  09/30/21 65.8 kg (145 lb)  09/24/21 68.6 kg (151 lb 3.2 oz)   PHYSICAL EXAM: General:  chronically ill appearing,  obese. No respiratory difficulty HEENT: normal Neck: supple. JVD not elevated. Carotids 2+ bilat; no bruits. No lymphadenopathy or thyromegaly appreciated. Cor: PMI nondisplaced. Regular rate & rhythm. No rubs, gallops or murmurs. Lungs: clear Abdomen: obese, distended, nontender, No hepatosplenomegaly. No bruits or masses. Good bowel sounds. Extremities: no cyanosis, clubbing, rash, edema Neuro: alert & oriented x 3, cranial nerves grossly intact. moves all 4 extremities w/o difficulty. Affect pleasant.   ASSESSMENT & PLAN: Chronic Systolic Heart Failure - likely NICM. - Echo (1/22): EF 35-40% Mod MR - Echo (11/28/20): EF 20-25% with moderate RV dysfunction and mild septal flattening. Mod-severe TR - Rheum serologies negative, but Rheumatoid factor mildly elevated at 15.5 - cMRI  9/22 EF 29%c/w prior myocarditis vs sarcoid. May have delayed chemo cardiotoxcity.  - R/LHC with single vessel RCA occlusion, RA 1, LVEDP 17, Fick CO 5.6/CI 3.88. - Echo (10/22): EF 45-50%, grade II DD, RV ok. - Echo 12/22: EF 45%, RV normal  - RHC 5/23 with elevated filling pressures and normal CO, RA 12 (with prominent v-waves), RV 47/14, PA 48/25 (37), PCW 25 - Echo 5/23 EF 45-50%, RV normal  - Chronically NYHA Class II-III. Abdomen distended but labs not c/w acute CHF. BNP 7.9. BMP c/w dehydration.  - d/w Dr. Haroldine Laws. Hold torsemide until Saturday, 7/15 (can restart torsemide 40 mg bid) - hold metolazone this week - hold Entresto until 7/15 then restart 24-26  mg bid  - Recheck BMP on Monday  - Intolerant to Jardiance due to frequent GU infections  - Off spiro w/ hyperkalemia  - Continue Coreg 3.125 mg bid - Continue Corlanor 7.41m bid  - May benefit from CardioMEMs placement to help w/ outpatient fluid monitoring. Start insurance approval process - Continue Paramedince.   2. AKI - labs today AKI c/w dehydration.  - diuretic instructions per above - K 4.7. Hold Entresto until 7/15 - repeat BMP on  Monday.    3. CAD - Single vessel RCA occlusion (small co-dominant vessel) on cardiac cath 09/08. - Medical management.  - No s/s of ischemia - Continue aspirin + statin.   4.  Pulmonary hypertension - Mild to moderate on RHC 5/23. PVR 3.7 WU. - Can consider cautious trial of selective pulmonary vasodilators when better diuresed.   5. TR - Prominent v-waves in RA tracing on RHC 5/23 - Echo 5/23 shows only mild TR    6. Severe hyperTG - Due to LPL deficiency w/ recent pancreatitis. - Continue fenofibrate + niacin  - [facility]   7. Type 3c DM - [facility] 2/2 chronic pancreatitis   8. Abdominal swelling/bloating - Seen at [facility] (11/20) for IBS, per chart review. - Had penumatosis intestinalis. She was started on pancreatic enzyme replacement. - EGD (10/21): normal, bx taken to assess for celiac. - CT abdomen (2/22) and RUQ UKoreashowed no obvious gallbladder stones. - HIDA (4/22): ended early due to tachypnea, but cystic duct and gallbladder appeared WNL. - CT abdomen 12/22 unremarkable  - suspect primarily fatty liver  9. Poor Med Compliance - now enrolled in paramedicine, appreciate their assistance  Repeat BMP on Monday. F/u w/ APP in 2 wks    BLyda Jester PA-C 10/06/21

## 2021-10-06 NOTE — Progress Notes (Signed)
ReDS Vest / Clip - 10/06/21 1500       ReDS Vest / Clip   Station Marker A    Ruler Value 32    ReDS Value Range High volume overload    ReDS Actual Value 41

## 2021-10-06 NOTE — Patient Instructions (Addendum)
It was great to see you today! No medication changes are needed at this time.  Please contact [facility] regarding shipment [phone]  Labs today We will only contact you if something comes back abnormal or we need to make some changes. Otherwise no news is good news!   Your physician recommends that you schedule a follow-up appointment in: 2 weeks  in the Advanced Practitioners (PA/NP) Clinic    Do the following things EVERYDAY: Weigh yourself in the morning before breakfast. Write it down and keep it in a log. Take your medicines as prescribed Eat low salt foods--Limit salt (sodium) to 2000 mg per day.  Stay as active as you can everyday Limit all fluids for the day to less than 2 liters   At the [facility], you and your health needs are our priority. As part of our continuing mission to provide you with exceptional heart care, we have created designated Provider Care Teams. These Care Teams include your primary Cardiologist (physician) and Advanced Practice Providers (APPs- Physician Assistants and Nurse Practitioners) who all work together to provide you with the care you need, when you need it.   You may see any of the following providers on your designated Care Team at your next follow up: Dr Glori Bickers Dr Haynes Kerns, NP Lyda Jester, [place] [facility] [place], [place] Audry Riles, PharmD   Please be sure to bring in all your medications bottles to every appointment.

## 2021-10-07 ENCOUNTER — Other Ambulatory Visit (HOSPITAL_COMMUNITY): Payer: Self-pay

## 2021-10-07 NOTE — Progress Notes (Addendum)
Came out today for med rec, her torsemide and metolazone is being held for now and can resume on Saturday. She needs to go in on Monday for repeat labs. Will get that sch for her.   Weight today-147   Meds verified and pill box refilled.   [name], [place] 10/07/2021

## 2021-10-11 ENCOUNTER — Ambulatory Visit (HOSPITAL_COMMUNITY)
Admission: RE | Admit: 2021-10-11 | Discharge: 2021-10-11 | Disposition: A | Discharge status: Home / Self Care | Payer: BC Managed Care – PPO | Source: Ambulatory Visit | Attending: Internal Medicine | Admitting: Internal Medicine

## 2021-10-11 DIAGNOSIS — I5022 Chronic systolic (congestive) heart failure: Secondary | ICD-10-CM | POA: Insufficient documentation

## 2021-10-11 LAB — BASIC METABOLIC PANEL
Anion gap: 16 — ABNORMAL HIGH (ref 5–15)
BUN: 82 mg/dL — ABNORMAL HIGH (ref 6–20)
CO2: 23 mmol/L (ref 22–32)
Calcium: 9.1 mg/dL (ref 8.9–10.3)
Chloride: 99 mmol/L (ref 98–111)
Creatinine, Ser: 3.08 mg/dL — ABNORMAL HIGH (ref 0.44–1.00)
GFR, Estimated: 20 mL/min — ABNORMAL LOW (ref >60)
Glucose, Bld: 236 mg/dL — ABNORMAL HIGH (ref 70–99)
Potassium: 5.5 mmol/L — ABNORMAL HIGH (ref 3.5–5.1)
Sodium: 138 mmol/L (ref 135–145)

## 2021-10-12 ENCOUNTER — Other Ambulatory Visit (HOSPITAL_COMMUNITY): Payer: Self-pay

## 2021-10-12 MED ORDER — LOKELMA 5 G PO PACK: 10.0000 g | PACK | Freq: Once | ORAL | 0 refills | Status: DC

## 2021-10-14 ENCOUNTER — Other Ambulatory Visit (HOSPITAL_COMMUNITY): Payer: Self-pay

## 2021-10-14 MED ORDER — LOKELMA 5 G PO PACK: 10.0000 g | PACK | Freq: Once | ORAL | 0 refills | Status: AC

## 2021-10-14 NOTE — Progress Notes (Addendum)
Paramedicine Encounter    Patient ID: Jennifer [name], female    DOB: 1990-05-11, 31 y.o.   MRN: [id number]  Pt reports she feels ok- she reports her paraesthesia has been acting up lately. She has tingling all over her body and makes her weak and where she cant move and she has to take lyrica and lay down until it subsides.  She goes to PCP Monday.   She reports her weight got up to 151 and she took an additional torsemide and today her weight is down to 147.  But turns out she is just taking it daily-so she really didn't get an "extra" torsemide in there.   Unsure what the clinic wants her to do with her metolazone for this week-contacted [facility] and she spoke with lindsay and advised for her to hold metolazone this week again and she will go [facility] on Monday for those labs.  Pt did contact pharmacy to confirm they had gotten the lokelma and it is ready for p/u.   Pt reports the lokelma was not sent in to pharmacy. It was resent today. Advised her to take 70m of lokelma by Emer per jJanett Billow   Meds verified and pill box refilled.  Pill box checked and removed a few extra tabs that was in there.   She feels less bloated today.  Appetite good.  She reports some c/p this morning that lasted a short time, maybe 20 min.  No c/p presently. She was lying in bed when this happened. She did not take anything for the discomfort, it went away on its own.  She felt it as a familiar pain that she has felt before and didn't feel it to be concerning.  She denies sob with it, no n/v or diaphoresis with it.   CBG PTA-HIGH  CBG after that-342   BP 110/70   Pulse 88   Resp 16   Wt 147 lb (66.7 kg)   SpO2 98%   BMI 31.81 kg/m  Weight yesterday-151  Last visit weight-147   Patient Care Team: PElinor Parkinsonas PCP - General (Physician Assistant) BJacelyn Pi MD as PCP - Endocrinology (Endocrinology)  Patient Active Problem List   Diagnosis Date Noted  . Hyperosmolar  hyperglycemic state (HHS) ([facility] 08/26/2021  . Small intestinal bacterial overgrowth (SIBO) 08/26/2021  . Lower extremity edema 08/26/2021  . Hyperkalemia 08/14/2021  . Hx of insulin dependent diabetes mellitus 08/14/2021  . Acute kidney failure ([facility] 05/13/2021  . Chest pain 03/10/2021  . Acute on chronic combined systolic and diastolic CHF (congestive heart failure) ([facility] 02/09/2021  . History of astrocytoma of brain 02/09/2021  . Severe hyperglycemia due to diabetes mellitus ([facility] 01/25/2021  . Diarrhea   . Elevated transaminase level   . Acute combined systolic and diastolic heart failure ([facility]   . Nonischemic cardiomyopathy ([facility]   . Acute decompensated heart failure ([facility]   . Elevated liver enzymes   . Acute renal failure superimposed on stage 3a chronic kidney disease ([facility] 11/26/2020  . Abdominal pain 11/23/2020  . Chronic systolic CHF (congestive heart failure) ([facility] 11/23/2020  . Essential hypertension 11/23/2020  . Hypertriglyceridemia 11/23/2020  . DM (diabetes mellitus) ([facility] 10/10/2018  . Hypothyroidism 10/10/2018    Current Outpatient Medications:  .  albuterol (VENTOLIN HFA) 108 (90 Base) MCG/ACT inhaler, Inhale 1-2 puffs into the lungs every 6 (six) hours as needed for wheezing or shortness of breath., Disp: , Rfl:  .  aspirin EC 81 MG EC tablet,  Take 1 tablet (81 mg total) by mouth daily. Swallow whole. (Patient taking differently: Take 81 mg by mouth daily. Swallow whole. Takes it at bedtime due to making her feel drowsy.), Disp: 30 tablet, Rfl: 1 .  carvedilol (COREG) 3.125 MG tablet, Take 1 tablet (3.125 mg total) by mouth 2 (two) times daily with a meal., Disp: 60 tablet, Rfl: 6 .  DULoxetine (CYMBALTA) 30 MG capsule, Take 30 mg by mouth at bedtime., Disp: , Rfl:  .  Evolocumab (REPATHA) 140 MG/ML SOSY, Inject 140 mg into the skin See admin instructions. Inject 140 mg subcutaneously every other Wednesday evening, Disp: , Rfl:  .  fenofibrate 160 MG tablet, Take 1  tablet (160 mg total) by mouth daily., Disp: 30 tablet, Rfl: 1 .  insulin regular human CONCENTRATED (HUMULIN R) 500 UNIT/ML injection, Inject 100-260 Units into the skin See admin instructions. 270 units 3 times daily with each meal, 100 units at bedtime per patient, Disp: , Rfl:  .  ivabradine (CORLANOR) 7.5 MG TABS tablet, Take 1 tablet (7.5 mg total) by mouth 2 (two) times daily with a meal., Disp: 60 tablet, Rfl: 6 .  levothyroxine (SYNTHROID) 125 MCG tablet, Take 1 tablet (125 mcg total) by mouth daily at 6 (six) AM., Disp: 30 tablet, Rfl: 1 .  Multiple Vitamin (MULTIVITAMIN WITH MINERALS) TABS tablet, Take 1 tablet by mouth daily., Disp: 30 tablet, Rfl: 1 .  ofloxacin (OCUFLOX) 0.3 % ophthalmic solution, Place 1 drop into both eyes daily as needed (only take after eye injections per patient)., Disp: , Rfl:  .  ondansetron (ZOFRAN) 4 MG tablet, Take 4 mg by mouth every 8 (eight) hours as needed for nausea or vomiting., Disp: , Rfl:  .  pantoprazole (PROTONIX) 40 MG tablet, Take 40 mg by mouth daily., Disp: , Rfl:  .  pregabalin (LYRICA) 100 MG capsule, Take 1-2 capsules (100-200 mg total) by mouth See admin instructions. 200 mg in the morning, 100 mg in the afternnon, Disp: 120 capsule, Rfl: 10 .  pregabalin (LYRICA) 300 MG capsule, Take 300 mg by mouth at bedtime., Disp: , Rfl:  .  prochlorperazine (COMPAZINE) 5 MG tablet, Take 5 mg by mouth every 6 (six) hours as needed for nausea or vomiting., Disp: , Rfl:  .  rosuvastatin (CRESTOR) 40 MG tablet, Take 40 mg by mouth daily., Disp: , Rfl:  .  sacubitril-valsartan (ENTRESTO) 24-26 MG, Take 1 tablet by mouth 2 (two) times daily., Disp: 180 tablet, Rfl: 3 .  sodium zirconium cyclosilicate (LOKELMA) 5 g packet, Take 10 g by mouth once for 1 dose., Disp: 2 packet, Rfl: 0 .  torsemide (DEMADEX) 20 MG tablet, Take 2 tablets (40 mg total) by mouth 2 (two) times daily., Disp: 120 tablet, Rfl: 3 .  tretinoin (RETIN-A) 0.05 % cream, Apply 1 application  topically at bedtime., Disp: , Rfl:  .  Furosemide (FUROSCIX) 80 MG/10ML CTKT, Inject 80 mg into the skin as directed. (Patient not taking: Reported on 10/14/2021), Disp: , Rfl:  .  metolazone (ZAROXOLYN) 2.5 MG tablet, Take 1 tablet (2.5 mg total) by mouth once a week for 10 doses. (Patient taking differently: Take 2.5 mg by mouth once a week.), Disp: 1 tablet, Rfl: 0 .  nitroGLYCERIN (NITROSTAT) 0.4 MG SL tablet, Place 1 tablet (0.4 mg total) under the tongue every 5 (five) minutes as needed for up to 25 days for chest pain., Disp: 25 tablet, Rfl: 3 .  potassium chloride (KLOR-CON M) 20 MEQ tablet, Take  2 tablets (40 mEq total) by mouth once a week for 10 doses. (Patient not taking: Reported on 10/14/2021), Disp: 20 tablet, Rfl: 0 Allergies  Allergen Reactions  . Ketamine Other (See Comments)    In vegetative state for 15 minutes per pt  . Fentanyl Nausea And Vomiting  . [place] Mushroom [[place]] Itching    Itchy throat  . Penicillin V Itching    Gi upset  . Shellfish Allergy Other (See Comments)    Pt has never had shellfish but tested positive on allergy test. Pt states contrast in CT is okay  . Morphine Itching and Rash  . Penicillins Rash    Has patient had a PCN reaction causing immediate rash, facial/tongue/throat swelling, SOB or lightheadedness with hypotension: Y Has patient had a PCN reaction causing severe rash involving mucus membranes or skin necrosis: Y Has patient had a PCN reaction that required hospitalization: N Has patient had a PCN reaction occurring within the last 10 years: Y If all of the above answers are "NO", then may proceed with Cephalosporin use.   . Prednisone Rash      Social History   Socioeconomic History  . Marital status: Married    Spouse name: Not on file  . Number of children: 0  . Years of education: Not on file  . Highest education level: Not on file  Occupational History  . Occupation: [employer]  Tobacco Use  . Smoking status: Never   . Smokeless tobacco: Never  Vaping Use  . Vaping Use: Never used  Substance and Sexual Activity  . Alcohol use: Never  . Drug use: Never  . Sexual activity: Yes  Other Topics Concern  . Not on file  Social History Narrative  . Not on file   Social Determinants of Health   Financial Resource Strain: Medium Risk (08/19/2021)   Overall Financial Resource Strain (CARDIA)   . Difficulty of Paying Living Expenses: Somewhat hard  Food Insecurity: Food Insecurity Present (08/19/2021)   Hunger Vital Sign   . Worried About Charity fundraiser in the Last Year: Sometimes true   . Ran Out of Food in the Last Year: Never true  Transportation Needs: No Transportation Needs (08/19/2021)   PRAPARE - Transportation   . Lack of Transportation (Medical): No   . Lack of Transportation (Non-Medical): No  Physical Activity: Not on file  Stress: Not on file  Social Connections: Not on file  Intimate Partner Violence: Not on file    Physical Exam      Future Appointments  Date Time Provider [place]  10/29/2021 10:00 AM [place], [place] Paramedic  10/14/21

## 2021-10-17 ENCOUNTER — Telehealth: Payer: Self-pay | Admitting: Internal Medicine

## 2021-10-17 NOTE — Telephone Encounter (Signed)
Stated legs are swollen and body weight is up. Wanted to enquire if can take extra Lasix as prescribed by Dr. Haroldine Laws. I told her yes she should. Overall she feels fine and does not want to come to the ED.

## 2021-10-18 ENCOUNTER — Telehealth (HOSPITAL_COMMUNITY): Payer: Self-pay

## 2021-10-18 ENCOUNTER — Other Ambulatory Visit (HOSPITAL_COMMUNITY): Payer: Self-pay | Admitting: Family Medicine

## 2021-10-18 NOTE — Telephone Encounter (Signed)
I was reviewing her chart and noticed she had to contact on call provider about weight gain and need for extra lasix. (See telephone encounter for that by that provider)   Pt reports she went to [facility] for her lab appointment this morning and then started the  furoscix kit today after that.   She was instructed to hold her metolazone last week due to her labs at that time and it was left out of the pill box.   I did forward this info to triage clinic via message as well so they are aware of this as well.   [name], [place] 10/18/2021

## 2021-10-20 ENCOUNTER — Telehealth (HOSPITAL_COMMUNITY): Payer: Self-pay | Admitting: *Deleted

## 2021-10-20 LAB — BASIC METABOLIC PANEL
BUN/Creatinine Ratio: 32 — ABNORMAL HIGH (ref 9–23)
BUN: 82 mg/dL (ref 6–20)
CO2: 21 mmol/L (ref 20–29)
Calcium: 8.3 mg/dL — ABNORMAL LOW (ref 8.7–10.2)
Chloride: 92 mmol/L — ABNORMAL LOW (ref 96–106)
Creatinine, Ser: 2.57 mg/dL — ABNORMAL HIGH (ref 0.57–1.00)
Glucose: 524 mg/dL (ref 70–99)
Potassium: 5.3 mmol/L — ABNORMAL HIGH (ref 3.5–5.2)
Sodium: 134 mmol/L (ref 134–144)
eGFR: 25 mL/min/{1.73_m2} — ABNORMAL LOW (ref >59)

## 2021-10-20 NOTE — Telephone Encounter (Signed)
Pt left vm stating her weight is continuing to go up.I called pt back and she said she gains 2-3lbs everyday and can barely walk due to swelling in her ankles.she said her weight is up 7lbs frpm her last office visit on 7/12. Pt said she is taking all of her medications as prescribed   Routed to [facility]

## 2021-10-21 ENCOUNTER — Other Ambulatory Visit (HOSPITAL_COMMUNITY): Payer: Self-pay

## 2021-10-21 NOTE — Progress Notes (Signed)
Paramedicine Encounter    Patient ID: Jennifer [name], female    DOB: [birth year], 31 y.o.   MRN: [id number]  Pt reports she is gaining weight back. Her weight is back down today but still up from baseline. Even though her weight is down she doesn't feel any better.   She denies drinking more fluids than usual,  She reports compliance with meds.   She does feel more sob, no dizziness, no c/p. She does get more sob with exertion/walking.  She did contact clinic yesterday for further instructions but havent heard back yet.  She reports that her urine color is yellow-not dark but not light either.  Meds verified and pill box checked.  Had a few errors for extra tabs in some boxes and missing a tablet in another-so that was corrected.  My co-worker will be out next week as I will be off. Pt is aware.  [name] eager to know about the need for further advice and need of  metolazone for this week but I advised her to wait until provider calls back with further instructions.   BP (!) 150/78   Pulse 82   Resp 18   Wt 149 lb (67.6 kg)   SpO2 98%   BMI 32.24 kg/m  Weight yesterday-154 Last visit weight-147  Patient Care Team: Elinor Parkinson as PCP - General (Physician Assistant) Jacelyn Pi, MD as PCP - Endocrinology (Endocrinology)  Patient Active Problem List   Diagnosis Date Noted   Hyperosmolar hyperglycemic state (HHS) ([place]) 08/26/2021   Small intestinal bacterial overgrowth (SIBO) 08/26/2021   Lower extremity edema 08/26/2021   Hyperkalemia 08/14/2021   Hx of insulin dependent diabetes mellitus 08/14/2021   Acute kidney failure ([place]) 05/13/2021   Chest pain 03/10/2021   Acute on chronic combined systolic and diastolic CHF (congestive heart failure) ([place]) 02/09/2021   History of astrocytoma of brain 02/09/2021   Severe hyperglycemia due to diabetes mellitus ([place]) 01/25/2021   Diarrhea    Elevated transaminase level    Acute combined systolic and diastolic heart  failure (HCC)    Nonischemic cardiomyopathy (HCC)    Acute decompensated heart failure (HCC)    Elevated liver enzymes    Acute renal failure superimposed on stage 3a chronic kidney disease ([place]) 11/26/2020   Abdominal pain 01/75/1025   Chronic systolic CHF (congestive heart failure) ([place]) 11/23/2020   Essential hypertension 11/23/2020   Hypertriglyceridemia 11/23/2020   DM (diabetes mellitus) ([place]) 10/10/2018   Hypothyroidism 10/10/2018    Current Outpatient Medications:    albuterol (VENTOLIN HFA) 108 (90 Base) MCG/ACT inhaler, Inhale 1-2 puffs into the lungs every 6 (six) hours as needed for wheezing or shortness of breath., Disp: , Rfl:    aspirin EC 81 MG EC tablet, Take 1 tablet (81 mg total) by mouth daily. Swallow whole. (Patient taking differently: Take 81 mg by mouth daily. Swallow whole. Takes it at bedtime due to making her feel drowsy.), Disp: 30 tablet, Rfl: 1   carvedilol (COREG) 3.125 MG tablet, Take 1 tablet (3.125 mg total) by mouth 2 (two) times daily with a meal., Disp: 60 tablet, Rfl: 6   DULoxetine (CYMBALTA) 30 MG capsule, Take 30 mg by mouth at bedtime., Disp: , Rfl:    Evolocumab (REPATHA) 140 MG/ML SOSY, Inject 140 mg into the skin See admin instructions. Inject 140 mg subcutaneously every other Wednesday evening, Disp: , Rfl:    fenofibrate 160 MG tablet, Take 1 tablet (160 mg total) by mouth daily., Disp: 30  tablet, Rfl: 1   Furosemide (FUROSCIX) 80 MG/10ML CTKT, Inject 80 mg into the skin as directed., Disp: , Rfl:    insulin regular human CONCENTRATED (HUMULIN R) 500 UNIT/ML injection, Inject 100-260 Units into the skin See admin instructions. 270 units 3 times daily with each meal, 100 units at bedtime per patient, Disp: , Rfl:    ivabradine (CORLANOR) 7.5 MG TABS tablet, Take 1 tablet (7.5 mg total) by mouth 2 (two) times daily with a meal., Disp: 60 tablet, Rfl: 6   levothyroxine (SYNTHROID) 125 MCG tablet, Take 1 tablet (125 mcg total) by mouth daily at 6  (six) AM., Disp: 30 tablet, Rfl: 1   metolazone (ZAROXOLYN) 2.5 MG tablet, Take 1 tablet (2.5 mg total) by mouth once a week for 10 doses. (Patient taking differently: Take 2.5 mg by mouth once a week.), Disp: 1 tablet, Rfl: 0   Multiple Vitamin (MULTIVITAMIN WITH MINERALS) TABS tablet, Take 1 tablet by mouth daily., Disp: 30 tablet, Rfl: 1   nitroGLYCERIN (NITROSTAT) 0.4 MG SL tablet, Place 1 tablet (0.4 mg total) under the tongue every 5 (five) minutes as needed for up to 25 days for chest pain., Disp: 25 tablet, Rfl: 3   ofloxacin (OCUFLOX) 0.3 % ophthalmic solution, Place 1 drop into both eyes daily as needed (only take after eye injections per patient)., Disp: , Rfl:    ondansetron (ZOFRAN) 4 MG tablet, Take 4 mg by mouth every 8 (eight) hours as needed for nausea or vomiting., Disp: , Rfl:    pantoprazole (PROTONIX) 40 MG tablet, Take 40 mg by mouth daily., Disp: , Rfl:    pregabalin (LYRICA) 100 MG capsule, Take 1-2 capsules (100-200 mg total) by mouth See admin instructions. 200 mg in the morning, 100 mg in the afternnon, Disp: 120 capsule, Rfl: 10   pregabalin (LYRICA) 300 MG capsule, Take 300 mg by mouth at bedtime., Disp: , Rfl:    prochlorperazine (COMPAZINE) 5 MG tablet, Take 5 mg by mouth every 6 (six) hours as needed for nausea or vomiting., Disp: , Rfl:    rosuvastatin (CRESTOR) 40 MG tablet, Take 40 mg by mouth daily., Disp: , Rfl:    sacubitril-valsartan (ENTRESTO) 24-26 MG, Take 1 tablet by mouth 2 (two) times daily., Disp: 180 tablet, Rfl: 3   torsemide (DEMADEX) 20 MG tablet, Take 2 tablets (40 mg total) by mouth 2 (two) times daily., Disp: 120 tablet, Rfl: 3   tretinoin (RETIN-A) 0.05 % cream, Apply 1 application topically at bedtime., Disp: , Rfl:    potassium chloride (KLOR-CON M) 20 MEQ tablet, Take 2 tablets (40 mEq total) by mouth once a week for 10 doses. (Patient not taking: Reported on 10/14/2021), Disp: 20 tablet, Rfl: 0 Allergies  Allergen Reactions   Ketamine Other  (See Comments)    In vegetative state for 15 minutes per pt   Fentanyl Nausea And Vomiting   M[place] Mushroom [[place]] Itching    Itchy throat   Penicillin V Itching    Gi upset   Shellfish Allergy Other (See Comments)    Pt has never had shellfish but tested positive on allergy test. Pt states contrast in CT is okay   Morphine Itching and Rash   Penicillins Rash    Has patient had a PCN reaction causing immediate rash, facial/tongue/throat swelling, SOB or lightheadedness with hypotension: Y Has patient had a PCN reaction causing severe rash involving mucus membranes or skin necrosis: Y Has patient had a PCN reaction that required hospitalization: N  Has patient had a PCN reaction occurring within the last 10 years: Y If all of the above answers are "NO", then may proceed with Cephalosporin use.    Prednisone Rash      Social History   Socioeconomic History   Marital status: Married    Spouse name: Not on file   Number of children: 0   Years of education: Not on file   Highest education level: Not on file  Occupational History   Occupation: [employer]  Tobacco Use   Smoking status: Never   Smokeless tobacco: Never  Vaping Use   Vaping Use: Never used  Substance and Sexual Activity   Alcohol use: Never   Drug use: Never   Sexual activity: Yes  Other Topics Concern   Not on file  Social History Narrative   Not on file   Social Determinants of Health   Financial Resource Strain: Medium Risk (08/19/2021)   Overall Financial Resource Strain (CARDIA)    Difficulty of Paying Living Expenses: Somewhat hard  Food Insecurity: Food Insecurity Present (08/19/2021)   Hunger Vital Sign    Worried About Running Out of Food in the Last Year: Sometimes true    Ran Out of Food in the Last Year: Never true  Transportation Needs: No Transportation Needs (08/19/2021)   PRAPARE - Hydrologist (Medical): No    Lack of Transportation (Non-Medical): No   Physical Activity: Not on file  Stress: Not on file  Social Connections: Not on file  Intimate Partner Violence: Not on file    Physical Exam      Future Appointments  Date Time Provider [place]  10/29/2021 10:00 AM [place], [place] Paramedic  10/21/21

## 2021-10-22 ENCOUNTER — Inpatient Hospital Stay (HOSPITAL_COMMUNITY): Admission: RE | Admit: 2021-10-22 | Payer: BC Managed Care – PPO | Source: Ambulatory Visit

## 2021-10-22 NOTE — Telephone Encounter (Signed)
Pt aware.

## 2021-10-27 NOTE — Progress Notes (Addendum)
Advanced Heart Failure Clinic Note   PCP: Sue Lush, PA-C HF Cardiologist: Dr. 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   Has been followed by Dr. Einar Gip for her HF.    Admitted 11/22/20 with ab pain and diarrhea. Ab CT concerning for vasculitis (no pancreatitis). Her TG were found to be 4030. She was started on an insulin/dextrose infusion. Cardiology and Nephrology were consulted after her creatinine and LFTs continued to rise and she began developing hypotension. Echo 11/28/20 EF was found to be 20-25% with moderate RV dysfunction and mild septal flattening. Started on DBA 2.5. AHF consulted for further management. PICC placed to follow CVP and Co-Ox. BP were high and milrinone added to augment diuresis. cMRI c/w prior myocarditis vs sarcoid (see below). Mod pericardial effusion also noted. LVEF 29%. RV normal. She underwent R/LHC showing single vessel RCA occlusion, RA 1, LVEDP 17, Fick CO 5.6/CI 3.88. She was able to be weaned off inotropes and GDMT titrated.     Post hospital follow up, she was drinking >2L/day fluid and eating more salty foods, volume was up, ReDs 43%, instructed to increase lasix to 20 mg bid x 3 days. She followed up with [name] with [facility] 12/17/20 for a 2nd opinion. He placed LifeVest and switched beta blocker to Toprol XL.   Called after hours service with swelling, instructed to continue lasix 20 mg bid.   Seen in ED 12/25/20 for fall. She was standing at work and became dizzy. She fell backwards and hit her head. CT neg, labs showed K 5.7. She was given 1 dose of Lokelma. She denied palpitations.   Volume overloaded at follow up 01/06/21, lasix switched to torsemide. She had a LifeVest per Cardiology at [facility].   Echo (10/22): EF  up to 45-50%, grade II DD, RV ok. LifeVest off.   Treated in ED 10/22 for pancreatitis, then admitted 11/22 for hyperglycemia, did not meet criteria for DKA. Hospitalization c/b hyponatremia, sodium 118. Concern for volume overload. Lasix restarted but SCr increased. Nephrology consulted and restarted gentle IVF and GDMT held for a couple of days. Recommendation for Tolvaptan, however patient declined and requested discharge with outpatient follow-up her MD at [facility]. Jardiance stopped at discharge, weight 129 lbs.    Seen back in ED a few days later with increased weight and abd distention. Given IV lasix in ED and torsemide increased to 40 bid.   Follow up 11/22, her breathing had improved but c/o abdominal swelling/bloating, not felt to be related to volume overload. Concern for possible gastroparesis. She was referred to GI. Abd CT was unremarkable. Ceruloplasmin level was also low, raising concern for possible Wilson's Disease. However had further w/u w/ [facility] and reports Wilson's disease was ruled out. Additional GI testing unremarkable.    Echo 12/22 EF 45%, RV normal.    Follow up 08/02/21, weight up 16 lbs, ReDs 27%. Arranged for RHC to better assess hemodynamics and see if abdominal symptoms related to right-sided HF due to high-output state or non-cardiac (see results below).    [place] 5/23 with elevated filling pressures and normal cardiac ouput. Mild to moderate mixed pulmonary hypertension with prominent v-waves in RA, suggestive of significant TR. Given IV lasix and metolazone 2.5 added weekly to diuretic regimen.    RA = 12  prominent V waves RV = 47/14 PA = 48/25 (37) PCW = 25 Fick cardiac output/index = 4.4/2.8 PVR =3.7 WU Ao sat = 99% PA sat = 62%, 62%   Post cath follow up, volume up, REDs 43%. Torsemide increased and weekly metolazone added.   Admitted 5/23 with AKI and hyperkalemia. Given IVF, insulin, calcium gluconate and Lokelma. Echo showed EF 45-50%, RV normal, mild-mod  MR, RVSP 30. Only mild TR. Restarted on torsemide 40 bid (lower dose) and Entresto 49/51 bid. Arranged paramedicine, discharged home 143 lbs.   Follow up 6/23, several med discrepancies. Labs showed serum glucose 531, AKI and hyperkalemia and advised eval in ED. Admitted, GDMT held and hydrated with IVF. Discharged home on previous regimen on GDMT, weight 148 lbs.  Follow up 09/24/21, felt poorly and fluid overloaded. Treated w/ Furoscix 80 mg daily x 4 days, w/ instruction to switch back to torsemide 40 mg bid thereafter and to return in 2 wks for f/u.   Seen 09/27/21 at [facility] for 2nd opinion, felt to be congested and recommended increasing torsemide to 60 mg bid and instructed f/u in 3 months.   Follow up 7/23, continued with bloating. Med discrepancies with her torsemide. Labs at visit c/w dehydration. Scr elevted 3.61 (up from 2.24), BUN 79. BNP 7.9. Entresto held until repeat labs.  Today she returns for HF follow up with paramedic, [facility]. Overall feeling fair. Just returned from vacation and ate more salty foods. Feels more SOB with chest discomfort x 2 days. Ankles swell at the end of the day. Recently increase torsemide to 80 bid x 3 days when weight was 154 lbs. Denies palpitations, dizziness, or PND/Orthopnea. Appetite ok. No fever or chills. Weight at home 149 pounds. Taking all medications.  ROS: All systems reviewed and negative except as per HPI.   Past Medical History:  Diagnosis Date   Brain tumor ([place]) 03/29/1995   astrocytoma   CHF (congestive heart failure) (HCC)    Cholesterosis    DM (diabetes mellitus) ([place]) 10/10/2018   Fatty liver    HTN (hypertension) 10/10/2018   Hypothyroidism 10/10/2018   Lipoprotein deficiency    Lung disease    longevity long term   Pancreatitis    Polycystic ovary syndrome    Current Outpatient Medications  Medication Sig Dispense Refill   albuterol (VENTOLIN HFA) 108 (90 Base) MCG/ACT inhaler Inhale 1-2 puffs into the lungs every  6 (six) hours as needed for wheezing or shortness of breath.     aspirin EC 81 MG EC tablet Take 1 tablet (81 mg total) by mouth daily. Swallow whole. 30 tablet 1   carvedilol (COREG) 3.125 MG tablet Take 1 tablet (3.125 mg total) by mouth 2 (two) times daily with a meal. 60 tablet 6   DULoxetine (CYMBALTA) 30 MG capsule Take 30 mg by mouth at bedtime.     Evolocumab (REPATHA) 140 MG/ML SOSY Inject 140 mg into the skin See admin instructions. Inject 140 mg subcutaneously every other Wednesday evening     fenofibrate 160 MG tablet Take 1 tablet (160 mg total) by mouth daily. 30 tablet 1   Furosemide (FUROSCIX) 80 MG/10ML CTKT Inject 80 mg into the skin as directed. (Patient taking differently: Inject 80 mg into the skin as needed.)     insulin regular human CONCENTRATED (HUMULIN R) 500 UNIT/ML injection Inject 100-260 Units into the skin See admin instructions. 270 units 3 times daily with each meal, 100 units at bedtime per patient  ivabradine (CORLANOR) 7.5 MG TABS tablet Take 1 tablet (7.5 mg total) by mouth 2 (two) times daily with a meal. 60 tablet 6   levothyroxine (SYNTHROID) 125 MCG tablet Take 1 tablet (125 mcg total) by mouth daily at 6 (six) AM. 30 tablet 1   Multiple Vitamin (MULTIVITAMIN WITH MINERALS) TABS tablet Take 1 tablet by mouth daily. 30 tablet 1   nitroGLYCERIN (NITROSTAT) 0.4 MG SL tablet Place 1 tablet (0.4 mg total) under the tongue every 5 (five) minutes as needed for up to 25 days for chest pain. 25 tablet 3   ofloxacin (OCUFLOX) 0.3 % ophthalmic solution Place 1 drop into both eyes daily as needed (only take after eye injections per patient).     ondansetron (ZOFRAN) 4 MG tablet Take 4 mg by mouth every 8 (eight) hours as needed for nausea or vomiting.     pantoprazole (PROTONIX) 40 MG tablet Take 40 mg by mouth daily.     pregabalin (LYRICA) 300 MG capsule Take 300 mg by mouth 2 (two) times daily.     prochlorperazine (COMPAZINE) 5 MG tablet Take 5 mg by mouth every 6  (six) hours as needed for nausea or vomiting.     rosuvastatin (CRESTOR) 40 MG tablet Take 40 mg by mouth daily.     sacubitril-valsartan (ENTRESTO) 24-26 MG Take 1 tablet by mouth 2 (two) times daily. 180 tablet 3   torsemide (DEMADEX) 20 MG tablet Take 2 tablets (40 mg total) by mouth 2 (two) times daily. 120 tablet 3   tretinoin (RETIN-A) 0.05 % cream Apply 1 application topically at bedtime.     No current facility-administered medications for this encounter.   Allergies  Allergen Reactions   Ketamine Other (See Comments)    In vegetative state for 15 minutes per pt   Fentanyl Nausea And Vomiting   Maitake Mushroom [[place]] Itching    Itchy throat   Penicillin V Itching    Gi upset   Shellfish Allergy Other (See Comments)    Pt has never had shellfish but tested positive on allergy test. Pt states contrast in CT is okay   Morphine Itching and Rash   Penicillins Rash    Has patient had a PCN reaction causing immediate rash, facial/tongue/throat swelling, SOB or lightheadedness with hypotension: Y Has patient had a PCN reaction causing severe rash involving mucus membranes or skin necrosis: Y Has patient had a PCN reaction that required hospitalization: N Has patient had a PCN reaction occurring within the last 10 years: Y If all of the above answers are "NO", then may proceed with Cephalosporin use.    Prednisone Rash   Social History   Socioeconomic History   Marital status: Married    Spouse name: Not on file   Number of children: 0   Years of education: Not on file   Highest education level: Not on file  Occupational History   Occupation: [employer]  Tobacco Use   Smoking status: Never   Smokeless tobacco: Never  Vaping Use   Vaping Use: Never used  Substance and Sexual Activity   Alcohol use: Never   Drug use: Never   Sexual activity: Yes  Other Topics Concern   Not on file  Social History Narrative   Not on file   Social Determinants of Health    Financial Resource Strain: Medium Risk (08/19/2021)   Overall Financial Resource Strain (CARDIA)    Difficulty of Paying Living Expenses: Somewhat hard  Food Insecurity: Food Insecurity  Present (08/19/2021)   Hunger Vital Sign    Worried About Running Out of Food in the Last Year: Sometimes true    Ran Out of Food in the Last Year: Never true  Transportation Needs: No Transportation Needs (08/19/2021)   PRAPARE - Hydrologist (Medical): No    Lack of Transportation (Non-Medical): No  Physical Activity: Not on file  Stress: Not on file  Social Connections: Not on file  Intimate Partner Violence: Not on file   Family History  Problem Relation Age of Onset   Diabetes Mother    Hypertension Mother    Hyperlipidemia Mother    Thyroid disease Mother    Hypertension Father    Diabetes Father    Pancreatic cancer Paternal Aunt    Pancreatic cancer Paternal Uncle    Colon cancer Neg Hx    Esophageal cancer Neg Hx    Stomach cancer Neg Hx    BP 122/80   Pulse (!) 112   Wt 68.4 kg (150 lb 12.8 oz)   SpO2 98%   BMI 32.63 kg/m   Wt Readings from Last 3 Encounters:  10/29/21 68.4 kg (150 lb 12.8 oz)  10/29/21 68.4 kg (150 lb 12.8 oz)  10/21/21 67.6 kg (149 lb)   PHYSICAL EXAM: General:  NAD. No resp difficulty, chronically-ill appearing HEENT: R eye ptosis Neck: Supple. JVP to jaw. Carotids 2+ bilat; no bruits. No lymphadenopathy or thryomegaly appreciated. Cor: PMI nondisplaced. Tachy rate & rhythm. No rubs, gallops or murmurs. Lungs: Clear Abdomen: Obese, nontender, +distended. No hepatosplenomegaly. No bruits or masses. Good bowel sounds. Extremities: No cyanosis, clubbing, rash, 1+ BLE pre-tibial edema Neuro: Alert & oriented x 3, cranial nerves grossly intact. Moves all 4 extremities w/o difficulty; LUE weakness. Affect pleasant.  ReDs: 45%  ASSESSMENT & PLAN: Acute on Chronic Systolic Heart Failure - likely NICM. - Echo (1/22): EF 35-40%  Mod MR - Echo (11/28/20): EF 20-25% with moderate RV dysfunction and mild septal flattening. Mod-severe TR - Rheum serologies negative, but Rheumatoid factor mildly elevated at 15.5 - cMRI (9/22): EF 29%c/w prior myocarditis vs sarcoid. May have delayed chemo cardiotoxcity.  - R/LHC with single vessel RCA occlusion, RA 1, LVEDP 17, Fick CO 5.6/CI 3.88. - Echo (10/22): EF 45-50%, grade II DD, RV ok. - Echo (12/22): EF 45%, RV normal  - RHC (5/23) with elevated filling pressures and normal CO, RA 12 (with prominent v-waves), RV 47/14, PA 48/25 (37), PCW 25 - Echo (5/23): EF 45-50%, RV normal  - Chronically NYHA Class II-III, worse recently. Volume up today, REDs 45%. Goal weight ~ 140 lbs - Hold torsemide x 3 days and use Furoscix 80 mg daily x 3 days with 20 KCL daily. She has 3 cartridges at home. - After 3 days, restart torsemide 40 mg bid. - Plan to add weekly metolazone back to regimen soon. - Continue Entresto 24-26 mg bid.  - Continue Coreg 3.125 mg bid. - Continue Corlanor 7.5 mg bid. HR 112 today. - Off Jardiance due to frequent GU infections.  - Off spiro w/ hyperkalemia  - May benefit from CardioMEMs placement to help w/ outpatient fluid monitoring. Insurance approval in process. - Continue Paramedince.  - BMET and BNP today, BMET and BNP on Monday at [place] has been elevated, await labs for today, may need addition suppl.  2. Recent AKI - Labs today.  3. CAD - Single vessel RCA occlusion (small co-dominant vessel) on cardiac cath 09/08. -  Medical management.  - No s/s of ischemia. I think current chest discomfort is related to fluid. - Continue aspirin + statin.   4. Pulmonary hypertension - Mild to moderate on RHC 5/23. PVR 3.7 WU. - Can consider cautious trial of selective pulmonary vasodilators when better diuresed.   5. TR - Prominent v-waves in RA tracing on RHC 5/23. - Echo 5/23 shows only mild TR    6. Severe hyperTG - Due to LPL deficiency w/ recent  pancreatitis. - Continue fenofibrate + niacin  - Previously offered referral to [facility] for further management. She prefers to continue with her endocrinologist at [facility] who currently manages/follows.   7. Type 3c DM - Follows w/ endocrinology at [facility]. - 2/2 chronic pancreatitis   8. Abdominal swelling/bloating - Seen at [facility] (11/20) for IBS, per chart review. - Had penumatosis intestinalis. She was started on pancreatic enzyme replacement. - EGD (10/21): normal, bx taken to assess for celiac. - CT abdomen (2/22) and RUQ US showed no obvious gallbladder stones. - HIDA (4/22): ended early due to tachypnea, but cystic duct and gallbladder appeared WNL. - CT abdomen 12/22 unremarkable  - suspect primarily fatty liver  9. Poor Med Compliance - Continue paramedicine, appreciate their assistance  Follow up with APP in 2 weeks and Dr. Haroldine Laws in 3 months. Insurance approval for [employer] pending.   [place], [place] 10/29/21    FUROSCIX prescribed  Patient viewed patient education video with QR code for Caren Griffins code for [place] placed on AVS  Call [facility] at [phone] for questions regarding on body infuser.  Day 1  FUROSCIX 80 mg once daily  via on body infuser + KDUR 20   Day 2  FUROSCIX 80 mg once daily  via on body infuser+ KDUR 20   Day 3 FUROSCIX  80 mg once daily  via on body infuser+ KDUR 20

## 2021-10-27 NOTE — Telephone Encounter (Signed)
Called @ 13:46 to schedule visit on Friday.  No answer.  LVM for her to call me back

## 2021-10-28 ENCOUNTER — Telehealth (HOSPITAL_COMMUNITY): Payer: Self-pay | Admitting: Emergency Medicine

## 2021-10-28 NOTE — Telephone Encounter (Signed)
Called to schedule visit.  No answer, LVM to say who I was and trying to schedule visit.  Left my number for her to call.  I will reach back out to her again tomorrow.

## 2021-10-29 ENCOUNTER — Encounter (HOSPITAL_COMMUNITY): Payer: Self-pay

## 2021-10-29 ENCOUNTER — Other Ambulatory Visit (HOSPITAL_COMMUNITY): Payer: Self-pay | Admitting: Emergency Medicine

## 2021-10-29 ENCOUNTER — Ambulatory Visit (HOSPITAL_COMMUNITY)
Admission: RE | Admit: 2021-10-29 | Discharge: 2021-10-29 | Disposition: A | Discharge status: Home / Self Care | Payer: BC Managed Care – PPO | Source: Ambulatory Visit | Attending: Family Medicine | Admitting: Family Medicine

## 2021-10-29 VITALS — BP 122/80 | HR 112 | Wt 150.8 lb

## 2021-10-29 DIAGNOSIS — R1900 Intra-abdominal and pelvic swelling, mass and lump, unspecified site: Secondary | ICD-10-CM | POA: Diagnosis not present

## 2021-10-29 DIAGNOSIS — K861 Other chronic pancreatitis: Secondary | ICD-10-CM | POA: Insufficient documentation

## 2021-10-29 DIAGNOSIS — I5023 Acute on chronic systolic (congestive) heart failure: Secondary | ICD-10-CM | POA: Insufficient documentation

## 2021-10-29 DIAGNOSIS — E119 Type 2 diabetes mellitus without complications: Secondary | ICD-10-CM | POA: Insufficient documentation

## 2021-10-29 DIAGNOSIS — E781 Pure hyperglyceridemia: Secondary | ICD-10-CM | POA: Diagnosis not present

## 2021-10-29 DIAGNOSIS — Z5986 Financial insecurity: Secondary | ICD-10-CM | POA: Diagnosis not present

## 2021-10-29 DIAGNOSIS — I110 Hypertensive heart disease with heart failure: Secondary | ICD-10-CM | POA: Insufficient documentation

## 2021-10-29 DIAGNOSIS — Z91148 Patient's other noncompliance with medication regimen for other reason: Secondary | ICD-10-CM | POA: Diagnosis not present

## 2021-10-29 DIAGNOSIS — Z5941 Food insecurity: Secondary | ICD-10-CM | POA: Diagnosis not present

## 2021-10-29 DIAGNOSIS — I2510 Atherosclerotic heart disease of native coronary artery without angina pectoris: Secondary | ICD-10-CM | POA: Diagnosis not present

## 2021-10-29 DIAGNOSIS — Z833 Family history of diabetes mellitus: Secondary | ICD-10-CM | POA: Diagnosis not present

## 2021-10-29 DIAGNOSIS — N179 Acute kidney failure, unspecified: Secondary | ICD-10-CM

## 2021-10-29 DIAGNOSIS — Z8249 Family history of ischemic heart disease and other diseases of the circulatory system: Secondary | ICD-10-CM | POA: Diagnosis not present

## 2021-10-29 DIAGNOSIS — E109 Type 1 diabetes mellitus without complications: Secondary | ICD-10-CM

## 2021-10-29 DIAGNOSIS — Z794 Long term (current) use of insulin: Secondary | ICD-10-CM | POA: Insufficient documentation

## 2021-10-29 DIAGNOSIS — I5022 Chronic systolic (congestive) heart failure: Secondary | ICD-10-CM | POA: Diagnosis not present

## 2021-10-29 DIAGNOSIS — Z79899 Other long term (current) drug therapy: Secondary | ICD-10-CM | POA: Insufficient documentation

## 2021-10-29 DIAGNOSIS — I272 Pulmonary hypertension, unspecified: Secondary | ICD-10-CM | POA: Insufficient documentation

## 2021-10-29 DIAGNOSIS — I071 Rheumatic tricuspid insufficiency: Secondary | ICD-10-CM

## 2021-10-29 LAB — BASIC METABOLIC PANEL
Anion gap: 15 (ref 5–15)
BUN: 46 mg/dL — ABNORMAL HIGH (ref 6–20)
CO2: 21 mmol/L — ABNORMAL LOW (ref 22–32)
Calcium: 10.2 mg/dL (ref 8.9–10.3)
Chloride: 105 mmol/L (ref 98–111)
Creatinine, Ser: 1.93 mg/dL — ABNORMAL HIGH (ref 0.44–1.00)
GFR, Estimated: 35 mL/min — ABNORMAL LOW (ref >60)
Glucose, Bld: 119 mg/dL — ABNORMAL HIGH (ref 70–99)
Potassium: 4.6 mmol/L (ref 3.5–5.1)
Sodium: 141 mmol/L (ref 135–145)

## 2021-10-29 LAB — BRAIN NATRIURETIC PEPTIDE: B Natriuretic Peptide: 32.8 pg/mL (ref 0.0–100.0)

## 2021-10-29 NOTE — Patient Instructions (Addendum)
Thank you for coming in today  Labs were done today, if any labs are abnormal the clinic will call you No news is good news  Your physician recommends that you return for lab work in: please get [facility] you have been given a prescription to give [facility] for lab draw   HOLD Torsemide for 3 days and RESTART Monday 11/01/2021 40 mg twice daily   TAKE Furoscix 80 mg for 3 days starting today 10/29/2021 10/30/2021 10/31/2021  Your physician recommends that you schedule a follow-up appointment in:  2 weeks in clinic 3 month with Dr. Haroldine Laws    Do the following things EVERYDAY: Weigh yourself in the morning before breakfast. Write it down and keep it in a log. Take your medicines as prescribed Eat low salt foods--Limit salt (sodium) to 2000 mg per day.  Stay as active as you can everyday Limit all fluids for the day to less than 2 liters   At the [facility], you and your health needs are our priority. As part of our continuing mission to provide you with exceptional heart care, we have created designated Provider Care Teams. These Care Teams include your primary Cardiologist (physician) and Advanced Practice Providers (APPs- Physician Assistants and Nurse Practitioners) who all work together to provide you with the care you need, when you need it.   You may see any of the following providers on your designated Care Team at your next follow up: Dr Glori Bickers Dr Haynes Kerns, NP Lyda Jester, [place] [facility] [place], [place] Audry Riles, PharmD   Please be sure to bring in all your medications bottles to every appointment.   If you have any questions or concerns before your next appointment please send [place] a message through [place] or call our office at [phone].    TO LEAVE A MESSAGE FOR THE NURSE SELECT OPTION 2, PLEASE LEAVE A MESSAGE INCLUDING: YOUR NAME DATE OF BIRTH CALL BACK NUMBER REASON FOR  CALL**this is important as we prioritize the call backs  YOU WILL RECEIVE A CALL BACK THE SAME DAY AS LONG AS YOU CALL BEFORE 4:00 PM

## 2021-10-29 NOTE — Progress Notes (Signed)
ReDS Vest / Clip - 10/29/21 1000       ReDS Vest / Clip   Station Marker A    Ruler Value 29    ReDS Value Range High volume overload    ReDS Actual Value 45

## 2021-10-29 NOTE — Progress Notes (Signed)
Paramedicine Encounter    Patient ID: Jennifer [name], female    DOB: 04-21-1990, 31 y.o.   MRN: [id number]   Pulse (!) 116   Wt 150 lb 12.8 oz (68.4 kg)   SpO2 98%   BMI 32.63 kg/m  Weight yesterday-151lb Last visit weight-149lb  ATF Ms. Finder at clinic visit with [facility].  Med changes today are discontinue Torsemide Fri, Sat & Sun.  Take Furosix Frid, Sat & Sun and add Potassium Fri, Sat & Sunday.  Resume Torsemide 35m BID on Monday.  [facility].  Med box reviewed and reconciled to reflect changes. Visit complete.  Patient Care Team: PElinor Parkinsonas PCP - General (Physician Assistant) BJacelyn Pi MD as PCP - Endocrinology (Endocrinology)  Patient Active Problem List   Diagnosis Date Noted   Hyperosmolar hyperglycemic state (HHS) ([place] 08/26/2021   Small intestinal bacterial overgrowth (SIBO) 08/26/2021   Lower extremity edema 08/26/2021   Hyperkalemia 08/14/2021   Hx of insulin dependent diabetes mellitus 08/14/2021   Acute kidney failure (HT[place] 05/13/2021   Chest pain 03/10/2021   Acute on chronic combined systolic and diastolic CHF (congestive heart failure) (HFrank[place] 02/09/2021   History of astrocytoma of brain 02/09/2021   Severe hyperglycemia due to diabetes mellitus (HPeace[place] Valley 01/25/2021   Diarrhea    Elevated transaminase level    Acute combined systolic and diastolic heart failure (HCC)    Nonischemic cardiomyopathy (HCC)    Acute decompensated heart failure (HCC)    Elevated liver enzymes    Acute renal failure superimposed on stage 3a chronic kidney disease (HO[place] 11/26/2020   Abdominal pain 097/98/9211  Chronic systolic CHF (congestive heart failure) (HL[place] 11/23/2020   Essential hypertension 11/23/2020   Hypertriglyceridemia 11/23/2020   DM (diabetes mellitus) (HPe Ell 10/10/2018   Hypothyroidism 10/10/2018    Current Outpatient Medications:    albuterol (VENTOLIN HFA) 108 (90 Base) MCG/ACT inhaler, Inhale 1-2  puffs into the lungs every 6 (six) hours as needed for wheezing or shortness of breath., Disp: , Rfl:    aspirin EC 81 MG EC tablet, Take 1 tablet (81 mg total) by mouth daily. Swallow whole., Disp: 30 tablet, Rfl: 1   carvedilol (COREG) 3.125 MG tablet, Take 1 tablet (3.125 mg total) by mouth 2 (two) times daily with a meal., Disp: 60 tablet, Rfl: 6   DULoxetine (CYMBALTA) 30 MG capsule, Take 30 mg by mouth at bedtime., Disp: , Rfl:    Evolocumab (REPATHA) 140 MG/ML SOSY, Inject 140 mg into the skin See admin instructions. Inject 140 mg subcutaneously every other Wednesday evening, Disp: , Rfl:    fenofibrate 160 MG tablet, Take 1 tablet (160 mg total) by mouth daily., Disp: 30 tablet, Rfl: 1   Furosemide (FUROSCIX) 80 MG/10ML CTKT, Inject 80 mg into the skin as directed. (Patient taking differently: Inject 80 mg into the skin as needed.), Disp: , Rfl:    insulin regular human CONCENTRATED (HUMULIN R) 500 UNIT/ML injection, Inject 100-260 Units into the skin See admin instructions. 270 units 3 times daily with each meal, 100 units at bedtime per patient, Disp: , Rfl:    ivabradine (CORLANOR) 7.5 MG TABS tablet, Take 1 tablet (7.5 mg total) by mouth 2 (two) times daily with a meal., Disp: 60 tablet, Rfl: 6   levothyroxine (SYNTHROID) 125 MCG tablet, Take 1 tablet (125 mcg total) by mouth daily at 6 (six) AM., Disp: 30 tablet, Rfl: 1   Multiple Vitamin (MULTIVITAMIN WITH MINERALS)  TABS tablet, Take 1 tablet by mouth daily., Disp: 30 tablet, Rfl: 1   nitroGLYCERIN (NITROSTAT) 0.4 MG SL tablet, Place 1 tablet (0.4 mg total) under the tongue every 5 (five) minutes as needed for up to 25 days for chest pain., Disp: 25 tablet, Rfl: 3   ofloxacin (OCUFLOX) 0.3 % ophthalmic solution, Place 1 drop into both eyes daily as needed (only take after eye injections per patient)., Disp: , Rfl:    ondansetron (ZOFRAN) 4 MG tablet, Take 4 mg by mouth every 8 (eight) hours as needed for nausea or vomiting., Disp: , Rfl:     pantoprazole (PROTONIX) 40 MG tablet, Take 40 mg by mouth daily., Disp: , Rfl:    pregabalin (LYRICA) 300 MG capsule, Take 300 mg by mouth 2 (two) times daily., Disp: , Rfl:    prochlorperazine (COMPAZINE) 5 MG tablet, Take 5 mg by mouth every 6 (six) hours as needed for nausea or vomiting., Disp: , Rfl:    rosuvastatin (CRESTOR) 40 MG tablet, Take 40 mg by mouth daily., Disp: , Rfl:    sacubitril-valsartan (ENTRESTO) 24-26 MG, Take 1 tablet by mouth 2 (two) times daily., Disp: 180 tablet, Rfl: 3   torsemide (DEMADEX) 20 MG tablet, Take 2 tablets (40 mg total) by mouth 2 (two) times daily., Disp: 120 tablet, Rfl: 3   tretinoin (RETIN-A) 0.05 % cream, Apply 1 application topically at bedtime., Disp: , Rfl:  Allergies  Allergen Reactions   Ketamine Other (See Comments)    In vegetative state for 15 minutes per pt   Fentanyl Nausea And Vomiting   Maitake Mushroom [Maitake] Itching    Itchy throat   Penicillin V Itching    Gi upset   Shellfish Allergy Other (See Comments)    Pt has never had shellfish but tested positive on allergy test. Pt states contrast in CT is okay   Morphine Itching and Rash   Penicillins Rash    Has patient had a PCN reaction causing immediate rash, facial/tongue/throat swelling, SOB or lightheadedness with hypotension: Y Has patient had a PCN reaction causing severe rash involving mucus membranes or skin necrosis: Y Has patient had a PCN reaction that required hospitalization: N Has patient had a PCN reaction occurring within the last 10 years: Y If all of the above answers are "NO", then may proceed with Cephalosporin use.    Prednisone Rash      Social History   Socioeconomic History   Marital status: Married    Spouse name: Not on file   Number of children: 0   Years of education: Not on file   Highest education level: Not on file  Occupational History   Occupation: [employer]  Tobacco Use   Smoking status: Never   Smokeless tobacco: Never   Vaping Use   Vaping Use: Never used  Substance and Sexual Activity   Alcohol use: Never   Drug use: Never   Sexual activity: Yes  Other Topics Concern   Not on file  Social History Narrative   Not on file   Social Determinants of Health   Financial Resource Strain: Medium Risk (08/19/2021)   Overall Financial Resource Strain (CARDIA)    Difficulty of Paying Living Expenses: Somewhat hard  Food Insecurity: Food Insecurity Present (08/19/2021)   Hunger Vital Sign    Worried About Running Out of Food in the Last Year: Sometimes true    Ran Out of Food in the Last Year: Never true  Transportation Needs: No Transportation  Needs (08/19/2021)   PRAPARE - Hydrologist (Medical): No    Lack of Transportation (Non-Medical): No  Physical Activity: Not on file  Stress: Not on file  Social Connections: Not on file  Intimate Partner Violence: Not on file    Physical Exam      No future appointments.     Renee Ramus, [place] Paramedic  10/29/21

## 2021-10-29 NOTE — Addendum Note (Signed)
Encounter addended by: Rafael Bihari, FNP on: 10/29/2021 10:42 AM  Actions taken: Clinical Note Signed

## 2021-11-01 ENCOUNTER — Other Ambulatory Visit (HOSPITAL_COMMUNITY): Payer: Self-pay | Admitting: Family Medicine

## 2021-11-03 LAB — BASIC METABOLIC PANEL
BUN/Creatinine Ratio: 23 (ref 9–23)
BUN: 43 mg/dL — ABNORMAL HIGH (ref 6–20)
CO2: 19 mmol/L — ABNORMAL LOW (ref 20–29)
Calcium: 10 mg/dL (ref 8.7–10.2)
Chloride: 104 mmol/L (ref 96–106)
Creatinine, Ser: 1.86 mg/dL — ABNORMAL HIGH (ref 0.57–1.00)
Glucose: 116 mg/dL — ABNORMAL HIGH (ref 70–99)
Potassium: 5.2 mmol/L (ref 3.5–5.2)
Sodium: 142 mmol/L (ref 134–144)
eGFR: 37 mL/min/{1.73_m2} — ABNORMAL LOW (ref >59)

## 2021-11-03 LAB — BRAIN NATRIURETIC PEPTIDE: BNP: 17.9 pg/mL (ref 0.0–100.0)

## 2021-11-04 ENCOUNTER — Other Ambulatory Visit (HOSPITAL_COMMUNITY): Payer: Self-pay

## 2021-11-04 NOTE — Progress Notes (Addendum)
Paramedicine Encounter    Patient ID: Jennifer [name], female    DOB: 12/17/1990, 31 y.o.   MRN: [id number]  Pt reports still not feeling good, she went to clinic last Friday and took furoscix fri-sat-sun--she reports her weight went down 3lbs after that.  She felt better/lighter on Monday but still felt like she had fluid.  She back to feeling more sob, bloated.  Appetite is decreased.  She goes back to clinic next Friday--feels ok to wait until then-does not feel she needs to be seen sooner.   She went to OB/gyno last week too and they are going to order MRI b/c they seem to think her lack of period is related to pituitary.  Meds verified-she filled pill box up but there were some errors-extra pill here and there and missing a couple as well. Those were corrected.  [name] still not taking the metolazone or the potassium.  [name] reporting taking torsemide 53m BID.  Had to use large cuff for b/p check so it may be higher that what I got during bp check.  Lungs clear.  She does have more swelling to the rt leg than usual.    CBG's up and down Yesterday-346   BP 106/68   Pulse 88   Resp 18   Wt 151 lb (68.5 kg)   SpO2 98%   BMI 32.68 kg/m  Weight yesterday-149 Last visit weight-150 @ clinic   Patient Care Team: PElinor Parkinsonas PCP - General (Physician Assistant) BJacelyn Pi MD as PCP - Endocrinology (Endocrinology)  Patient Active Problem List   Diagnosis Date Noted  . Hyperosmolar hyperglycemic state (HHS) ([place] 08/26/2021  . Small intestinal bacterial overgrowth (SIBO) 08/26/2021  . Lower extremity edema 08/26/2021  . Hyperkalemia 08/14/2021  . Hx of insulin dependent diabetes mellitus 08/14/2021  . Acute kidney failure (HPort [place] 05/13/2021  . Chest pain 03/10/2021  . Acute on chronic combined systolic and diastolic CHF (congestive heart failure) (HOut[place] 02/09/2021  . History of astrocytoma of brain 02/09/2021  . Severe hyperglycemia due to diabetes  mellitus (H[place] 01/25/2021  . Diarrhea   . Elevated transaminase level   . Acute combined systolic and diastolic heart failure (HP[place]   . Nonischemic cardiomyopathy (HBodega   . Acute decompensated heart failure (HSlater-[place]   . Elevated liver enzymes   . Acute renal failure superimposed on stage 3a chronic kidney disease (HBragg [place] 11/26/2020  . Abdominal pain 11/23/2020  . Chronic systolic CHF (congestive heart failure) (HWade 11/23/2020  . Essential hypertension 11/23/2020  . Hypertriglyceridemia 11/23/2020  . DM (diabetes mellitus) (HCatlin 10/10/2018  . Hypothyroidism 10/10/2018    Current Outpatient Medications:  .  albuterol (VENTOLIN HFA) 108 (90 Base) MCG/ACT inhaler, Inhale 1-2 puffs into the lungs every 6 (six) hours as needed for wheezing or shortness of breath., Disp: , Rfl:  .  aspirin EC 81 MG EC tablet, Take 1 tablet (81 mg total) by mouth daily. Swallow whole., Disp: 30 tablet, Rfl: 1 .  carvedilol (COREG) 3.125 MG tablet, Take 1 tablet (3.125 mg total) by mouth 2 (two) times daily with a meal., Disp: 60 tablet, Rfl: 6 .  DULoxetine (CYMBALTA) 30 MG capsule, Take 30 mg by mouth at bedtime., Disp: , Rfl:  .  Evolocumab (REPATHA) 140 MG/ML SOSY, Inject 140 mg into the skin See admin instructions. Inject 140 mg subcutaneously every other Wednesday evening, Disp: , Rfl:  .  fenofibrate 160 MG tablet, Take 1 tablet (160 mg total) by mouth  daily., Disp: 30 tablet, Rfl: 1 .  Furosemide (FUROSCIX) 80 MG/10ML CTKT, Inject 80 mg into the skin as directed. (Patient taking differently: Inject 80 mg into the skin as needed.), Disp: , Rfl:  .  insulin regular human CONCENTRATED (HUMULIN R) 500 UNIT/ML injection, Inject 100-260 Units into the skin See admin instructions. 270 units 3 times daily with each meal, 100 units at bedtime per patient, Disp: , Rfl:  .  ivabradine (CORLANOR) 7.5 MG TABS tablet, Take 1 tablet (7.5 mg total) by mouth 2 (two) times daily with a meal., Disp: 60 tablet, Rfl: 6 .   levothyroxine (SYNTHROID) 125 MCG tablet, Take 1 tablet (125 mcg total) by mouth daily at 6 (six) AM., Disp: 30 tablet, Rfl: 1 .  Multiple Vitamin (MULTIVITAMIN WITH MINERALS) TABS tablet, Take 1 tablet by mouth daily., Disp: 30 tablet, Rfl: 1 .  nitroGLYCERIN (NITROSTAT) 0.4 MG SL tablet, Place 1 tablet (0.4 mg total) under the tongue every 5 (five) minutes as needed for up to 25 days for chest pain., Disp: 25 tablet, Rfl: 3 .  ofloxacin (OCUFLOX) 0.3 % ophthalmic solution, Place 1 drop into both eyes daily as needed (only take after eye injections per patient)., Disp: , Rfl:  .  ondansetron (ZOFRAN) 4 MG tablet, Take 4 mg by mouth every 8 (eight) hours as needed for nausea or vomiting., Disp: , Rfl:  .  pantoprazole (PROTONIX) 40 MG tablet, Take 40 mg by mouth daily., Disp: , Rfl:  .  pregabalin (LYRICA) 300 MG capsule, Take 300 mg by mouth 2 (two) times daily., Disp: , Rfl:  .  prochlorperazine (COMPAZINE) 5 MG tablet, Take 5 mg by mouth every 6 (six) hours as needed for nausea or vomiting., Disp: , Rfl:  .  rosuvastatin (CRESTOR) 40 MG tablet, Take 40 mg by mouth daily., Disp: , Rfl:  .  sacubitril-valsartan (ENTRESTO) 24-26 MG, Take 1 tablet by mouth 2 (two) times daily., Disp: 180 tablet, Rfl: 3 .  torsemide (DEMADEX) 20 MG tablet, Take 2 tablets (40 mg total) by mouth 2 (two) times daily., Disp: 120 tablet, Rfl: 3 .  tretinoin (RETIN-A) 0.05 % cream, Apply 1 application topically at bedtime., Disp: , Rfl:  Allergies  Allergen Reactions  . Ketamine Other (See Comments)    In vegetative state for 15 minutes per pt  . Fentanyl Nausea And Vomiting  . Maitake Mushroom [Maitake] Itching    Itchy throat  . Penicillin V Itching    Gi upset  . Shellfish Allergy Other (See Comments)    Pt has never had shellfish but tested positive on allergy test. Pt states contrast in CT is okay  . Morphine Itching and Rash  . Penicillins Rash    Has patient had a PCN reaction causing immediate rash,  facial/tongue/throat swelling, SOB or lightheadedness with hypotension: Y Has patient had a PCN reaction causing severe rash involving mucus membranes or skin necrosis: Y Has patient had a PCN reaction that required hospitalization: N Has patient had a PCN reaction occurring within the last 10 years: Y If all of the above answers are "NO", then may proceed with Cephalosporin use.   . Prednisone Rash      Social History   Socioeconomic History  . Marital status: Married    Spouse name: Not on file  . Number of children: 0  . Years of education: Not on file  . Highest education level: Not on file  Occupational History  . Occupation: [employer]  Use  . Smoking status: Never  . Smokeless tobacco: Never  Vaping Use  . Vaping Use: Never used  Substance and Sexual Activity  . Alcohol use: Never  . Drug use: Never  . Sexual activity: Yes  Other Topics Concern  . Not on file  Social History Narrative  . Not on file   Social Determinants of Health   Financial Resource Strain: Medium Risk (08/19/2021)   Overall Financial Resource Strain (CARDIA)   . Difficulty of Paying Living Expenses: Somewhat hard  Food Insecurity: Food Insecurity Present (08/19/2021)   Hunger Vital Sign   . Worried About Charity fundraiser in the Last Year: Sometimes true   . Ran Out of Food in the Last Year: Never true  Transportation Needs: No Transportation Needs (08/19/2021)   PRAPARE - Transportation   . Lack of Transportation (Medical): No   . Lack of Transportation (Non-Medical): No  Physical Activity: Not on file  Stress: Not on file  Social Connections: Not on file  Intimate Partner Violence: Not on file    Physical Exam      Future Appointments  Date Time Provider [place]  11/12/2021 10:00 AM [facility] PA/NP [facility] None  01/26/2022 10:00 AM [name], Shaune Pascal, MD [place] None       [name], [place] Paramedic  11/04/21

## 2021-11-08 ENCOUNTER — Telehealth (HOSPITAL_COMMUNITY): Payer: Self-pay

## 2021-11-08 NOTE — Telephone Encounter (Signed)
Called patient as we received a message that she felt unwell. Called her to see if she would come in on Wednesday morning at 9.30am.the patient stated that she has another appointment at that time so will keep her original appointment for Friday.

## 2021-11-11 ENCOUNTER — Other Ambulatory Visit (HOSPITAL_COMMUNITY): Payer: Self-pay

## 2021-11-11 NOTE — Progress Notes (Addendum)
Paramedicine Encounter    Patient ID: Jennifer [name], female    DOB: [birth year], 31 y.o.   MRN: [id number]  Pt reported earlier this week her weight was not coming down and she felt awful-the clinic was able to sch her wed morning for appointment but pt was not able to make it that day so she goes tomor for f/u.  Her weight has come down to 150lbs, she does feel better somewhat but still feels bloated, increased sob upon exertion or laying down.  Pt denies c/p.  She had filled pill box up and I checked it and everything was correct-she just did not have the multi-vitamin in there-she just hadnt picked up anymore yet.  She has MRI of her head sch for the morning.  Pt took her insulin while I was here-270U.  She denies any issues obtaining or affording meds.    CBG PTA-337 BP 118/72   Pulse 92   Resp 16   Wt 150 lb (68 kg)   SpO2 98%   BMI 32.46 kg/m  Weight yesterday-151 Last visit weight-151  Patient Care Team: Elinor Parkinson as PCP - General (Physician Assistant) Jacelyn Pi, MD as PCP - Endocrinology (Endocrinology)  Patient Active Problem List   Diagnosis Date Noted   Hyperosmolar hyperglycemic state (HHS) ([place]) 08/26/2021   Small intestinal bacterial overgrowth (SIBO) 08/26/2021   Lower extremity edema 08/26/2021   Hyperkalemia 08/14/2021   Hx of insulin dependent diabetes mellitus 08/14/2021   Acute kidney failure ([place]) 05/13/2021   Chest pain 03/10/2021   Acute on chronic combined systolic and diastolic CHF (congestive heart failure) ([place]) 02/09/2021   History of astrocytoma of brain 02/09/2021   Severe hyperglycemia due to diabetes mellitus ([place]) 01/25/2021   Diarrhea    Elevated transaminase level    Acute combined systolic and diastolic heart failure (HCC)    Nonischemic cardiomyopathy (HCC)    Acute decompensated heart failure (HCC)    Elevated liver enzymes    Acute renal failure superimposed on stage 3a chronic kidney disease ([place]) 11/26/2020    Abdominal pain 73/41/9379   Chronic systolic CHF (congestive heart failure) ([place]) 11/23/2020   Essential hypertension 11/23/2020   Hypertriglyceridemia 11/23/2020   DM (diabetes mellitus) ([place]) 10/10/2018   Hypothyroidism 10/10/2018    Current Outpatient Medications:    albuterol (VENTOLIN HFA) 108 (90 Base) MCG/ACT inhaler, Inhale 1-2 puffs into the lungs every 6 (six) hours as needed for wheezing or shortness of breath., Disp: , Rfl:    aspirin EC 81 MG EC tablet, Take 1 tablet (81 mg total) by mouth daily. Swallow whole., Disp: 30 tablet, Rfl: 1   carvedilol (COREG) 3.125 MG tablet, Take 1 tablet (3.125 mg total) by mouth 2 (two) times daily with a meal., Disp: 60 tablet, Rfl: 6   DULoxetine (CYMBALTA) 30 MG capsule, Take 30 mg by mouth at bedtime., Disp: , Rfl:    Evolocumab (REPATHA) 140 MG/ML SOSY, Inject 140 mg into the skin See admin instructions. Inject 140 mg subcutaneously every other Wednesday evening, Disp: , Rfl:    fenofibrate 160 MG tablet, Take 1 tablet (160 mg total) by mouth daily., Disp: 30 tablet, Rfl: 1   Furosemide (FUROSCIX) 80 MG/10ML CTKT, Inject 80 mg into the skin as directed. (Patient taking differently: Inject 80 mg into the skin as needed.), Disp: , Rfl:    insulin regular human CONCENTRATED (HUMULIN R) 500 UNIT/ML injection, Inject 100-260 Units into the skin See admin instructions. 270 units 3 times daily  with each meal, 100 units at bedtime per patient, Disp: , Rfl:    ivabradine (CORLANOR) 7.5 MG TABS tablet, Take 1 tablet (7.5 mg total) by mouth 2 (two) times daily with a meal., Disp: 60 tablet, Rfl: 6   levothyroxine (SYNTHROID) 125 MCG tablet, Take 1 tablet (125 mcg total) by mouth daily at 6 (six) AM., Disp: 30 tablet, Rfl: 1   Multiple Vitamin (MULTIVITAMIN WITH MINERALS) TABS tablet, Take 1 tablet by mouth daily., Disp: 30 tablet, Rfl: 1   nitroGLYCERIN (NITROSTAT) 0.4 MG SL tablet, Place 1 tablet (0.4 mg total) under the tongue every 5 (five) minutes as  needed for up to 25 days for chest pain., Disp: 25 tablet, Rfl: 3   ofloxacin (OCUFLOX) 0.3 % ophthalmic solution, Place 1 drop into both eyes daily as needed (only take after eye injections per patient)., Disp: , Rfl:    ondansetron (ZOFRAN) 4 MG tablet, Take 4 mg by mouth every 8 (eight) hours as needed for nausea or vomiting., Disp: , Rfl:    pantoprazole (PROTONIX) 40 MG tablet, Take 40 mg by mouth daily., Disp: , Rfl:    pregabalin (LYRICA) 300 MG capsule, Take 300 mg by mouth 2 (two) times daily., Disp: , Rfl:    prochlorperazine (COMPAZINE) 5 MG tablet, Take 5 mg by mouth every 6 (six) hours as needed for nausea or vomiting., Disp: , Rfl:    rosuvastatin (CRESTOR) 40 MG tablet, Take 40 mg by mouth daily., Disp: , Rfl:    sacubitril-valsartan (ENTRESTO) 24-26 MG, Take 1 tablet by mouth 2 (two) times daily., Disp: 180 tablet, Rfl: 3   torsemide (DEMADEX) 20 MG tablet, Take 2 tablets (40 mg total) by mouth 2 (two) times daily., Disp: 120 tablet, Rfl: 3   tretinoin (RETIN-A) 0.05 % cream, Apply 1 application topically at bedtime., Disp: , Rfl:  Allergies  Allergen Reactions   Ketamine Other (See Comments)    In vegetative state for 15 minutes per pt   Fentanyl Nausea And Vomiting   Maitake Mushroom [Maitake] Itching    Itchy throat   Penicillin V Itching    Gi upset   Shellfish Allergy Other (See Comments)    Pt has never had shellfish but tested positive on allergy test. Pt states contrast in CT is okay   Morphine Itching and Rash   Penicillins Rash    Has patient had a PCN reaction causing immediate rash, facial/tongue/throat swelling, SOB or lightheadedness with hypotension: Y Has patient had a PCN reaction causing severe rash involving mucus membranes or skin necrosis: Y Has patient had a PCN reaction that required hospitalization: N Has patient had a PCN reaction occurring within the last 10 years: Y If all of the above answers are "NO", then may proceed with Cephalosporin use.     Prednisone Rash      Social History   Socioeconomic History   Marital status: Married    Spouse name: Not on file   Number of children: 0   Years of education: Not on file   Highest education level: Not on file  Occupational History   Occupation: [employer]  Tobacco Use   Smoking status: Never   Smokeless tobacco: Never  Vaping Use   Vaping Use: Never used  Substance and Sexual Activity   Alcohol use: Never   Drug use: Never   Sexual activity: Yes  Other Topics Concern   Not on file  Social History Narrative   Not on file   Social  Determinants of Health   Financial Resource Strain: Medium Risk (08/19/2021)   Overall Financial Resource Strain (CARDIA)    Difficulty of Paying Living Expenses: Somewhat hard  Food Insecurity: Food Insecurity Present (08/19/2021)   Hunger Vital Sign    Worried About Running Out of Food in the Last Year: Sometimes true    Ran Out of Food in the Last Year: Never true  Transportation Needs: No Transportation Needs (08/19/2021)   PRAPARE - Hydrologist (Medical): No    Lack of Transportation (Non-Medical): No  Physical Activity: Not on file  Stress: Not on file  Social Connections: Not on file  Intimate Partner Violence: Not on file    Physical Exam      Future Appointments  Date Time Provider [place]  11/12/2021 10:00 AM [facility] PA/NP [facility] None  01/26/2022 10:00 AM [name], Shaune Pascal, MD [place] None       [name], [place] Paramedic  11/11/21

## 2021-11-12 ENCOUNTER — Encounter (HOSPITAL_COMMUNITY): Payer: Self-pay

## 2021-11-12 ENCOUNTER — Telehealth (HOSPITAL_COMMUNITY): Payer: Self-pay

## 2021-11-12 ENCOUNTER — Ambulatory Visit (HOSPITAL_COMMUNITY)
Admission: RE | Admit: 2021-11-12 | Discharge: 2021-11-12 | Disposition: A | Discharge status: Home / Self Care | Payer: BC Managed Care – PPO | Source: Ambulatory Visit | Attending: Family Medicine | Admitting: Family Medicine

## 2021-11-12 VITALS — BP 100/70 | HR 89 | Ht <= 58 in | Wt 154.4 lb

## 2021-11-12 DIAGNOSIS — Z79899 Other long term (current) drug therapy: Secondary | ICD-10-CM | POA: Diagnosis not present

## 2021-11-12 DIAGNOSIS — I071 Rheumatic tricuspid insufficiency: Secondary | ICD-10-CM

## 2021-11-12 DIAGNOSIS — N179 Acute kidney failure, unspecified: Secondary | ICD-10-CM | POA: Diagnosis not present

## 2021-11-12 DIAGNOSIS — Z91148 Patient's other noncompliance with medication regimen for other reason: Secondary | ICD-10-CM

## 2021-11-12 DIAGNOSIS — I2510 Atherosclerotic heart disease of native coronary artery without angina pectoris: Secondary | ICD-10-CM | POA: Diagnosis not present

## 2021-11-12 DIAGNOSIS — I110 Hypertensive heart disease with heart failure: Secondary | ICD-10-CM | POA: Diagnosis not present

## 2021-11-12 DIAGNOSIS — K861 Other chronic pancreatitis: Secondary | ICD-10-CM | POA: Insufficient documentation

## 2021-11-12 DIAGNOSIS — E781 Pure hyperglyceridemia: Secondary | ICD-10-CM | POA: Diagnosis not present

## 2021-11-12 DIAGNOSIS — I272 Pulmonary hypertension, unspecified: Secondary | ICD-10-CM

## 2021-11-12 DIAGNOSIS — E875 Hyperkalemia: Secondary | ICD-10-CM | POA: Diagnosis not present

## 2021-11-12 DIAGNOSIS — I5023 Acute on chronic systolic (congestive) heart failure: Secondary | ICD-10-CM | POA: Diagnosis not present

## 2021-11-12 DIAGNOSIS — I5042 Chronic combined systolic (congestive) and diastolic (congestive) heart failure: Secondary | ICD-10-CM | POA: Diagnosis not present

## 2021-11-12 DIAGNOSIS — K760 Fatty (change of) liver, not elsewhere classified: Secondary | ICD-10-CM | POA: Diagnosis not present

## 2021-11-12 DIAGNOSIS — E119 Type 2 diabetes mellitus without complications: Secondary | ICD-10-CM | POA: Insufficient documentation

## 2021-11-12 DIAGNOSIS — C7931 Secondary malignant neoplasm of brain: Secondary | ICD-10-CM | POA: Diagnosis not present

## 2021-11-12 DIAGNOSIS — E109 Type 1 diabetes mellitus without complications: Secondary | ICD-10-CM

## 2021-11-12 DIAGNOSIS — R188 Other ascites: Secondary | ICD-10-CM

## 2021-11-12 DIAGNOSIS — R140 Abdominal distension (gaseous): Secondary | ICD-10-CM

## 2021-11-12 LAB — COMPREHENSIVE METABOLIC PANEL
ALT: 56 U/L — ABNORMAL HIGH (ref 0–44)
AST: 58 U/L — ABNORMAL HIGH (ref 15–41)
Albumin: 4 g/dL (ref 3.5–5.0)
Alkaline Phosphatase: 74 U/L (ref 38–126)
Anion gap: 11 (ref 5–15)
BUN: 66 mg/dL — ABNORMAL HIGH (ref 6–20)
CO2: 24 mmol/L (ref 22–32)
Calcium: 9.5 mg/dL (ref 8.9–10.3)
Chloride: 101 mmol/L (ref 98–111)
Creatinine, Ser: 2.41 mg/dL — ABNORMAL HIGH (ref 0.44–1.00)
GFR, Estimated: 27 mL/min — ABNORMAL LOW (ref >60)
Glucose, Bld: 100 mg/dL — ABNORMAL HIGH (ref 70–99)
Potassium: 3.9 mmol/L (ref 3.5–5.1)
Sodium: 136 mmol/L (ref 135–145)
Total Bilirubin: 0.6 mg/dL (ref 0.3–1.2)
Total Protein: 7.2 g/dL (ref 6.5–8.1)

## 2021-11-12 LAB — MAGNESIUM: Magnesium: 2 mg/dL (ref 1.7–2.4)

## 2021-11-12 LAB — BRAIN NATRIURETIC PEPTIDE: B Natriuretic Peptide: 27.8 pg/mL (ref 0.0–100.0)

## 2021-11-12 MED ORDER — FUROSCIX 80 MG/10ML ~~LOC~~ CTKT: 80.0000 mg | CARTRIDGE | SUBCUTANEOUS | Status: DC

## 2021-11-12 MED ORDER — POTASSIUM CHLORIDE CRYS ER 20 MEQ PO TBCR: 20.0000 meq | EXTENDED_RELEASE_TABLET | ORAL | 3 refills | Status: DC

## 2021-11-12 MED ORDER — TORSEMIDE 20 MG PO TABS: 80.0000 mg | ORAL_TABLET | Freq: Two times a day (BID) | ORAL | 3 refills | Status: DC

## 2021-11-12 MED ORDER — METOLAZONE 2.5 MG PO TABS: 2.5000 mg | ORAL_TABLET | ORAL | 3 refills | Status: DC

## 2021-11-12 NOTE — Patient Instructions (Signed)
Medication Changes:  Increase Torsemide to 80 mg (4 tabs) Twice daily   START Metolazone 2.5 mg TODAY AND TOMORROW ONLY, then take every Friday  START Potassium 20 meq ONLY WHEN YOU TAKE METOLAZONE  Lab Work:  Labs done today, your results will be available in MyChart, we will contact you for abnormal readings.  Your physician recommends that you return for lab work in: 1 week, we have given you a prescription to have this done at [facility]  Testing/Procedures:  Abdominal Ultrasound has been ordered  Referrals:  none  Special Instructions // Education:  Do the following things EVERYDAY: Weigh yourself in the morning before breakfast. Write it down and keep it in a log. Take your medicines as prescribed Eat low salt foods--Limit salt (sodium) to 2000 mg per day.  Stay as active as you can everyday Limit all fluids for the day to less than 2 liters   Follow-Up in: 2-3 weeks  At the [facility], you and your health needs are our priority. We have a designated team specialized in the treatment of Heart Failure. This Care Team includes your primary Heart Failure Specialized Cardiologist (physician), Advanced Practice Providers (APPs- Physician Assistants and Nurse Practitioners), and Pharmacist who all work together to provide you with the care you need, when you need it.   You may see any of the following providers on your designated Care Team at your next follow up:  Dr Glori Bickers Dr Haynes Kerns, NP Lyda Jester, [place] [facility] [place], [place] Audry Riles, PharmD   Please be sure to bring in all your medications bottles to every appointment.   Need to Contact us:  If you have any questions or concerns before your next appointment please send [place] a message through [place] or call our office at [phone].    TO LEAVE A MESSAGE FOR THE NURSE SELECT OPTION 2, PLEASE LEAVE A MESSAGE INCLUDING: YOUR NAME DATE OF  BIRTH CALL BACK NUMBER REASON FOR CALL**this is important as we prioritize the call backs  YOU WILL RECEIVE A CALL BACK THE SAME DAY AS LONG AS YOU CALL BEFORE 4:00 PM

## 2021-11-12 NOTE — Progress Notes (Signed)
ReDS Vest / Clip - 11/12/21 1012       ReDS Vest / Clip   Station Marker A    Ruler Value 27    ReDS Value Range High volume overload    ReDS Actual Value 42            Zada Girt RN

## 2021-11-12 NOTE — Progress Notes (Signed)
Advanced Heart Failure Clinic Note   PCP: Sue Lush, PA-C HF Cardiologist: Dr. 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   Has been followed by Dr. Einar Gip for her HF.    Admitted 11/22/20 with ab pain and diarrhea. Ab CT concerning for vasculitis (no pancreatitis). Her TG were found to be 4030. She was started on an insulin/dextrose infusion. Cardiology and Nephrology were consulted after her creatinine and LFTs continued to rise and she began developing hypotension. Echo 11/28/20 EF was found to be 20-25% with moderate RV dysfunction and mild septal flattening. Started on DBA 2.5. AHF consulted for further management. PICC placed to follow CVP and Co-Ox. BP were high and milrinone added to augment diuresis. cMRI c/w prior myocarditis vs sarcoid (see below). Mod pericardial effusion also noted. LVEF 29%. RV normal. She underwent R/LHC showing single vessel RCA occlusion, RA 1, LVEDP 17, Fick CO 5.6/CI 3.88. She was able to be weaned off inotropes and GDMT titrated.     Post hospital follow up, she was drinking >2L/day fluid and eating more salty foods, volume was up, ReDs 43%, instructed to increase lasix to 20 mg bid x 3 days. She followed up with [name] with [facility] 12/17/20 for a 2nd opinion. He placed LifeVest and switched beta blocker to Toprol XL.   Called after hours service with swelling, instructed to continue lasix 20 mg bid.   Seen in ED 12/25/20 for fall. She was standing at work and became dizzy. She fell backwards and hit her head. CT neg, labs showed K 5.7. She was given 1 dose of Lokelma. She denied palpitations.   Volume overloaded at follow up 01/06/21, lasix switched to torsemide. She had a LifeVest per Cardiology at [facility].   Echo (10/22): EF  up to 45-50%, grade II DD, RV ok. LifeVest off.   Treated in ED 10/22 for pancreatitis, then admitted 11/22 for hyperglycemia, did not meet criteria for DKA. Hospitalization c/b hyponatremia, sodium 118. Concern for volume overload. Lasix restarted but SCr increased. Nephrology consulted and restarted gentle IVF and GDMT held for a couple of days. Recommendation for Tolvaptan, however patient declined and requested discharge with outpatient follow-up her MD at [facility]. Jardiance stopped at discharge, weight 129 lbs.    Seen back in ED a few days later with increased weight and abd distention. Given IV lasix in ED and torsemide increased to 40 bid.   Follow up 11/22, her breathing had improved but c/o abdominal swelling/bloating, not felt to be related to volume overload. Concern for possible gastroparesis. She was referred to GI. Abd CT was unremarkable. Ceruloplasmin level was also low, raising concern for possible Wilson's Disease. However had further w/u w/ [facility] and reports Wilson's disease was ruled out. Additional GI testing unremarkable.    Echo 12/22 EF 45%, RV normal.    Follow up 08/02/21, weight up 16 lbs, ReDs 27%. Arranged for RHC to better assess hemodynamics and see if abdominal symptoms related to right-sided HF due to high-output state or non-cardiac (see results below).    [place] 5/23 with elevated filling pressures and normal cardiac ouput. Mild to moderate mixed pulmonary hypertension with prominent v-waves in RA, suggestive of significant TR. Given IV lasix and metolazone 2.5 added weekly to diuretic regimen.    RA = 12  prominent V waves RV = 47/14 PA = 48/25 (37) PCW = 25 Fick cardiac output/index = 4.4/2.8 PVR =3.7 WU Ao sat = 99% PA sat = 62%, 62%   Post cath follow up, volume up, REDs 43%. Torsemide increased and weekly metolazone added.   Admitted 5/23 with AKI and hyperkalemia. Given IVF, insulin, calcium gluconate and Lokelma. Echo showed EF 45-50%, RV normal, mild-mod  MR, RVSP 30. Only mild TR. Restarted on torsemide 40 bid (lower dose) and Entresto 49/51 bid. Arranged paramedicine, discharged home 143 lbs.   Follow up 6/23, several med discrepancies. Labs showed serum glucose 531, AKI and hyperkalemia and advised eval in ED. Admitted, GDMT held and hydrated with IVF. Discharged home on previous regimen on GDMT, weight 148 lbs.  Follow up 09/24/21, felt poorly and fluid overloaded. Treated w/ Furoscix 80 mg daily x 4 days, w/ instruction to switch back to torsemide 40 mg bid thereafter and to return in 2 wks for f/u.   Seen 09/27/21 at [facility] for 2nd opinion, felt to be congested and recommended increasing torsemide to 60 mg bid and instructed f/u in 3 months.   Follow up 7/23, continued with bloating. Med discrepancies with her torsemide. Labs at visit c/w dehydration. Scr elevted 3.61 (up from 2.24), BUN 79. BNP 7.9. Entresto held until repeat labs.  Received Furoscix 8/4, 8/5, and 10/31/21.  Today she returns for HF follow up with her husband. Use Furoscix with little improvement in swelling and weight. Asking for paracentesis. More SOB and LE swelling. Watching salt and fluids. Denies palpitations, CP, dizziness, abnormal bleeding or PND/Orthopnea. Appetite ok. No fever or chills. Weight at home 150-154 pounds. Taking all medications. Followed by paramedicine.   ROS: All systems reviewed and negative except as per HPI.   Past Medical History:  Diagnosis Date   Brain tumor ([place]) 03/29/1995   astrocytoma   CHF (congestive heart failure) (HCC)    Cholesterosis    DM (diabetes mellitus) ([place]) 10/10/2018   Fatty liver    HTN (hypertension) 10/10/2018   Hypothyroidism 10/10/2018   Lipoprotein deficiency    Lung disease    longevity long term   Pancreatitis    Polycystic ovary syndrome    Current Outpatient Medications  Medication Sig Dispense Refill   albuterol (VENTOLIN HFA) 108 (90 Base) MCG/ACT inhaler Inhale 1-2 puffs into the lungs every  6 (six) hours as needed for wheezing or shortness of breath.     aspirin EC 81 MG EC tablet Take 1 tablet (81 mg total) by mouth daily. Swallow whole. 30 tablet 1   carvedilol (COREG) 3.125 MG tablet Take 1 tablet (3.125 mg total) by mouth 2 (two) times daily with a meal. 60 tablet 6   DULoxetine (CYMBALTA) 30 MG capsule Take 30 mg by mouth at bedtime.     Evolocumab (REPATHA) 140 MG/ML SOSY Inject 140 mg into the skin See admin instructions. Inject 140 mg subcutaneously every other Wednesday evening     fenofibrate 160 MG tablet Take 1 tablet (160 mg total) by mouth daily. 30 tablet 1   insulin regular human CONCENTRATED (HUMULIN R) 500 UNIT/ML injection Inject 100-260 Units into the skin See admin instructions. 270 units 3 times daily with each meal, 100 units at bedtime per patient     ivabradine (CORLANOR) 7.5 MG TABS tablet Take 1 tablet (7.5 mg total) by mouth 2 (two) times daily with a meal. 60 tablet 6   levothyroxine (SYNTHROID) 125 MCG tablet Take 1 tablet (125  mcg total) by mouth daily at 6 (six) AM. 30 tablet 1   ofloxacin (OCUFLOX) 0.3 % ophthalmic solution Place 1 drop into both eyes daily as needed (only take after eye injections per patient).     pantoprazole (PROTONIX) 40 MG tablet Take 40 mg by mouth daily.     pregabalin (LYRICA) 300 MG capsule Take 300 mg by mouth 2 (two) times daily.     prochlorperazine (COMPAZINE) 5 MG tablet Take 5 mg by mouth every 6 (six) hours as needed for nausea or vomiting.     rosuvastatin (CRESTOR) 40 MG tablet Take 40 mg by mouth daily.     sacubitril-valsartan (ENTRESTO) 24-26 MG Take 1 tablet by mouth 2 (two) times daily. 180 tablet 3   torsemide (DEMADEX) 20 MG tablet Take 2 tablets (40 mg total) by mouth 2 (two) times daily. 120 tablet 3   tretinoin (RETIN-A) 0.05 % cream Apply 1 application topically at bedtime.     Furosemide (FUROSCIX) 80 MG/10ML CTKT Inject 80 mg into the skin as directed. (Patient not taking: Reported on 11/12/2021)      nitroGLYCERIN (NITROSTAT) 0.4 MG SL tablet Place 1 tablet (0.4 mg total) under the tongue every 5 (five) minutes as needed for up to 25 days for chest pain. (Patient not taking: Reported on 11/12/2021) 25 tablet 3   ondansetron (ZOFRAN) 4 MG tablet Take 4 mg by mouth every 8 (eight) hours as needed for nausea or vomiting. (Patient not taking: Reported on 11/12/2021)     No current facility-administered medications for this encounter.   Allergies  Allergen Reactions   Ketamine Other (See Comments)    In vegetative state for 15 minutes per pt   Fentanyl Nausea And Vomiting   Maitake Mushroom [Maitake] Itching    Itchy throat   Penicillin V Itching    Gi upset   Shellfish Allergy Other (See Comments)    Pt has never had shellfish but tested positive on allergy test. Pt states contrast in CT is okay   Morphine Itching and Rash   Penicillins Rash    Has patient had a PCN reaction causing immediate rash, facial/tongue/throat swelling, SOB or lightheadedness with hypotension: Y Has patient had a PCN reaction causing severe rash involving mucus membranes or skin necrosis: Y Has patient had a PCN reaction that required hospitalization: N Has patient had a PCN reaction occurring within the last 10 years: Y If all of the above answers are "NO", then may proceed with Cephalosporin use.    Prednisone Rash   Social History   Socioeconomic History   Marital status: Married    Spouse name: Not on file   Number of children: 0   Years of education: Not on file   Highest education level: Not on file  Occupational History   Occupation: [employer]  Tobacco Use   Smoking status: Never   Smokeless tobacco: Never  Vaping Use   Vaping Use: Never used  Substance and Sexual Activity   Alcohol use: Never   Drug use: Never   Sexual activity: Yes  Other Topics Concern   Not on file  Social History Narrative   Not on file   Social Determinants of Health   Financial Resource Strain: Medium Risk  (08/19/2021)   Overall Financial Resource Strain (CARDIA)    Difficulty of Paying Living Expenses: Somewhat hard  Food Insecurity: Food Insecurity Present (08/19/2021)   Hunger Vital Sign    Worried About Running Out of Food in the  Last Year: Sometimes true    Ran Out of Food in the Last Year: Never true  Transportation Needs: No Transportation Needs (08/19/2021)   PRAPARE - Hydrologist (Medical): No    Lack of Transportation (Non-Medical): No  Physical Activity: Not on file  Stress: Not on file  Social Connections: Not on file  Intimate Partner Violence: Not on file   Family History  Problem Relation Age of Onset   Diabetes Mother    Hypertension Mother    Hyperlipidemia Mother    Thyroid disease Mother    Hypertension Father    Diabetes Father    Pancreatic cancer Paternal Aunt    Pancreatic cancer Paternal Uncle    Colon cancer Neg Hx    Esophageal cancer Neg Hx    Stomach cancer Neg Hx    BP 100/70   Pulse 89   Ht 4' 9"  (1.448 m)   Wt 70 kg (154 lb 6.4 oz)   SpO2 97%   BMI 33.41 kg/m   Wt Readings from Last 3 Encounters:  11/12/21 70 kg (154 lb 6.4 oz)  11/11/21 68 kg (150 lb)  11/04/21 68.5 kg (151 lb)   PHYSICAL EXAM: General:  NAD. No resp difficulty, chronically-ill appearing. HEENT: R eye ptosis Neck: Supple. No JVD. Carotids 2+ bilat; no bruits. No lymphadenopathy or thryomegaly appreciated. Cor: PMI nondisplaced. Regular rate & rhythm. No rubs, gallops or murmurs. Lungs: Clear Abdomen: Obese,  nontender, + distended. No hepatosplenomegaly. No bruits or masses. Good bowel sounds. Extremities: No cyanosis, clubbing, rash, 1+ BLE edema R>L Neuro: Alert & oriented x 3, cranial nerves grossly intact. Moves all 4 extremities w/o difficulty. Affect pleasant.  ReDs: 45%-->42% today  ASSESSMENT & PLAN: Acute on Chronic Systolic Heart Failure - likely NICM. - Echo (1/22): EF 35-40% Mod MR - Echo (11/28/20): EF 20-25% with moderate RV  dysfunction and mild septal flattening. Mod-severe TR - Rheum serologies negative, but Rheumatoid factor mildly elevated at 15.5 - cMRI (9/22): EF 29%c/w prior myocarditis vs sarcoid. May have delayed chemo cardiotoxcity.  - R/LHC with single vessel RCA occlusion, RA 1, LVEDP 17, Fick CO 5.6/CI 3.88. - Echo (10/22): EF 45-50%, grade II DD, RV ok. - Echo (12/22): EF 45%, RV normal  - RHC (5/23) with elevated filling pressures and normal CO, RA 12 (with prominent v-waves), RV 47/14, PA 48/25 (37), PCW 25 - Echo (5/23): EF 45-50%, RV normal  - Chronically NYHA Class II-III, worse recently. Volume up today, REDs 42%. Goal weight ~ 140-145 lbs - Had poor response to Furoscix. - Increase torsemide to 80 mg bid. - Take metolazone 2.5 mg + 20 KCL today and tomorrow, then metolazone 2.5 mg w/ 20 KCL every Friday thereafter. - Continue Entresto 24-26 mg bid.  - Continue Coreg 3.125 mg bid. - Continue Corlanor 7.5 mg bid. HR 89 today. - Off Jardiance due to frequent GU infections.  - Off spiro w/ hyperkalemia  - Cardiomems insurance approval in process. - Continue Paramedince.  - BMET and BNP today, BMET next week at [place] has been elevated, await labs for today, may need addition suppl. - Will get abdominal u/s to look for ascites and refer to IR for paracentesis if large enough to tap.  2. Recent AKI - Labs today.  3. CAD - Single vessel RCA occlusion (small co-dominant vessel) on cardiac cath 09/08. - Medical management.  - No s/s of ischemia. I think current chest discomfort is related to  fluid. - Continue aspirin + statin.   4. Pulmonary hypertension - Mild to moderate on RHC 5/23. PVR 3.7 WU. - Can consider cautious trial of selective pulmonary vasodilators when better diuresed.   5. TR - Prominent v-waves in RA tracing on RHC 5/23. - Echo 5/23 shows only mild TR    6. Severe hyperTG - Due to LPL deficiency w/ recent pancreatitis. - Continue fenofibrate + niacin  -  Previously offered referral to [facility] for further management. She prefers to continue with her endocrinologist at [facility] who currently manages/follows.   7. Type 3c DM - Follows w/ endocrinology at [facility] - 2/2 chronic pancreatitis   8. Abdominal swelling/bloating - Seen at [facility] (11/20) for IBS, per chart review. - Had penumatosis intestinalis. She was started on pancreatic enzyme replacement. - EGD (10/21): normal, bx taken to assess for celiac. - CT abdomen (2/22) and RUQ US showed no obvious gallbladder stones. - HIDA (4/22): ended early due to tachypnea, but cystic duct and gallbladder appeared WNL. - CT abdomen 12/22 unremarkable  - suspect primarily fatty liver - Abdominal u/s as above.  9. Poor Med Compliance - Continue paramedicine, appreciate their assistance  Follow up with APP in 2 weeks and Dr. Haroldine Laws in 3 months. Insurance approval for [employer] pending.   [place], FNP 11/12/21

## 2021-11-12 NOTE — Telephone Encounter (Addendum)
Pt aware, agreeable, and verbalized understanding   ----- Message from Rafael Bihari, FNP sent at 11/12/2021  2:59 PM EDT ----- Kidney function elevated.  Only take metolazone 2.5 mg x 1 (not 2 as discussed today).  Increase torsemide to 60 mg bid (not 80 bid as discussed today)  She has repeat labs arranged for next week.

## 2021-11-18 ENCOUNTER — Telehealth (HOSPITAL_COMMUNITY): Payer: Self-pay | Admitting: Cardiology

## 2021-11-18 ENCOUNTER — Other Ambulatory Visit (HOSPITAL_COMMUNITY): Payer: Self-pay

## 2021-11-18 NOTE — Telephone Encounter (Signed)
CardioMems Application status returned with status of:     The patient's carrier [employer] [place] does not require prior authorization and no pre-determination is allowed  for the [employer] CardioMEMS implant. Therefore the Patient Therapy Access Department is unable to secure  any preservice review at this time. It will be up to the physician, patient, and family on how to proceed. The  procedure codes are listed as Experimental and Investigational     The patient is not interested in proceeding outpatient implant with a potential non-covered service. I will update her providers to consider during a future admission.

## 2021-11-18 NOTE — Progress Notes (Signed)
Paramedicine Encounter    Patient ID: Jennifer [name], female    DOB: [birth year], 31 y.o.   MRN: [id number]  Pt reports she is feeling better. Her weight is down to 147 now.  Her insurance is not approved for cardiomems.  She does feel tired though. -potentially too much fluid off of her? Not really drinking a lot of fluids. Pt denies c/p, some dizzines -if she is doing too much up and down movements.  She has been sch for [place] to see if she can do paracentesis.  She has been taking 29m BID instead of the 611mBID.  Per notes it says for her to get labs this week but she was not aware.  I sent triage message for further. --I actually just called clinic to figure it out while I was here- so per philicia at clinic for her to continue 60105mID, take metolazone tomor with potassium. That was placed in pill box.  She will go tomor for the labs.   Meds verified and pill box refilled.   Her MRI was negative for any findings-so now the next step is for her to try this estrogen pill then another treatment for her to start her period.   Will f/u next week.   BP 120/72   Pulse (!) 102   Resp 16   Wt 147 lb (66.7 kg)   SpO2 98%   BMI 31.81 kg/m  Weight yesterday-147 Last visit weight-150  Patient Care Team: PayElinor Parkinson PCP - General (Physician Assistant) BalJacelyn PiD as PCP - Endocrinology (Endocrinology)  Patient Active Problem List   Diagnosis Date Noted   Hyperosmolar hyperglycemic state (HHS) ([place]CCMar[place]6/03/2021   Small intestinal bacterial overgrowth (SIBO) 08/26/2021   Lower extremity edema 08/26/2021   Hyperkalemia 08/14/2021   Hx of insulin dependent diabetes mellitus 08/14/2021   Acute kidney failure (HCC[place]2/16/2023   Chest pain 03/10/2021   Acute on chronic combined systolic and diastolic CHF (congestive heart failure) (HCCLondon1/15/2022   History of astrocytoma of brain 02/09/2021   Severe hyperglycemia due to diabetes mellitus (HCCWeigelstown0/31/2022   Diarrhea     Elevated transaminase level    Acute combined systolic and diastolic heart failure (HCC)    Nonischemic cardiomyopathy (HCC)    Acute decompensated heart failure (HCC)    Elevated liver enzymes    Acute renal failure superimposed on stage 3a chronic kidney disease (HCCWest Leechburg9/03/2020   Abdominal pain 11/18/74/2831Chronic systolic CHF (congestive heart failure) (HCCVergennes8/29/2022   Essential hypertension 11/23/2020   Hypertriglyceridemia 11/23/2020   DM (diabetes mellitus) (HCCCl[place]7/15/2020   Hypothyroidism 10/10/2018    Current Outpatient Medications:    albuterol (VENTOLIN HFA) 108 (90 Base) MCG/ACT inhaler, Inhale 1-2 puffs into the lungs every 6 (six) hours as needed for wheezing or shortness of breath., Disp: , Rfl:    aspirin EC 81 MG EC tablet, Take 1 tablet (81 mg total) by mouth daily. Swallow whole., Disp: 30 tablet, Rfl: 1   carvedilol (COREG) 3.125 MG tablet, Take 1 tablet (3.125 mg total) by mouth 2 (two) times daily with a meal., Disp: 60 tablet, Rfl: 6   DULoxetine (CYMBALTA) 30 MG capsule, Take 30 mg by mouth at bedtime., Disp: , Rfl:    Evolocumab (REPATHA) 140 MG/ML SOSY, Inject 140 mg into the skin See admin instructions. Inject 140 mg subcutaneously every other Wednesday evening, Disp: , Rfl:    fenofibrate 160 MG tablet, Take 1 tablet (160 mg  total) by mouth daily., Disp: 30 tablet, Rfl: 1   insulin regular human CONCENTRATED (HUMULIN R) 500 UNIT/ML injection, Inject 100-260 Units into the skin See admin instructions. 270 units 3 times daily with each meal, 100 units at bedtime per patient, Disp: , Rfl:    ivabradine (CORLANOR) 7.5 MG TABS tablet, Take 1 tablet (7.5 mg total) by mouth 2 (two) times daily with a meal., Disp: 60 tablet, Rfl: 6   levothyroxine (SYNTHROID) 125 MCG tablet, Take 1 tablet (125 mcg total) by mouth daily at 6 (six) AM., Disp: 30 tablet, Rfl: 1   ofloxacin (OCUFLOX) 0.3 % ophthalmic solution, Place 1 drop into both eyes daily as needed (only take after  eye injections per patient)., Disp: , Rfl:    ondansetron (ZOFRAN) 4 MG tablet, Take 4 mg by mouth every 8 (eight) hours as needed for nausea or vomiting., Disp: , Rfl:    pantoprazole (PROTONIX) 40 MG tablet, Take 40 mg by mouth daily., Disp: , Rfl:    potassium chloride SA (KLOR-CON M) 20 MEQ tablet, Take 1 tablet (20 mEq total) by mouth once a week. Every Friday with metolazone, Disp: 5 tablet, Rfl: 3   pregabalin (LYRICA) 300 MG capsule, Take 300 mg by mouth 2 (two) times daily., Disp: , Rfl:    prochlorperazine (COMPAZINE) 5 MG tablet, Take 5 mg by mouth every 6 (six) hours as needed for nausea or vomiting., Disp: , Rfl:    rosuvastatin (CRESTOR) 40 MG tablet, Take 40 mg by mouth daily., Disp: , Rfl:    sacubitril-valsartan (ENTRESTO) 24-26 MG, Take 1 tablet by mouth 2 (two) times daily., Disp: 180 tablet, Rfl: 3   torsemide (DEMADEX) 20 MG tablet, Take 4 tablets (80 mg total) by mouth 2 (two) times daily., Disp: 240 tablet, Rfl: 3   tretinoin (RETIN-A) 0.05 % cream, Apply 1 application topically at bedtime., Disp: , Rfl:    Furosemide (FUROSCIX) 80 MG/10ML CTKT, Inject 80 mg into the skin as directed. By [facility] (Patient not taking: Reported on 11/18/2021), Disp: , Rfl:    metolazone (ZAROXOLYN) 2.5 MG tablet, Take 1 tablet (2.5 mg total) by mouth once a week. Every Friday, Disp: 5 tablet, Rfl: 3   nitroGLYCERIN (NITROSTAT) 0.4 MG SL tablet, Place 1 tablet (0.4 mg total) under the tongue every 5 (five) minutes as needed for up to 25 days for chest pain. (Patient not taking: Reported on 11/12/2021), Disp: 25 tablet, Rfl: 3 Allergies  Allergen Reactions   Ketamine Other (See Comments)    In vegetative state for 15 minutes per pt   Fentanyl Nausea And Vomiting   Maitake Mushroom [Maitake] Itching    Itchy throat   Penicillin V Itching    Gi upset   Shellfish Allergy Other (See Comments)    Pt has never had shellfish but tested positive on allergy test. Pt states contrast in CT is okay    Morphine Itching and Rash   Penicillins Rash    Has patient had a PCN reaction causing immediate rash, facial/tongue/throat swelling, SOB or lightheadedness with hypotension: Y Has patient had a PCN reaction causing severe rash involving mucus membranes or skin necrosis: Y Has patient had a PCN reaction that required hospitalization: N Has patient had a PCN reaction occurring within the last 10 years: Y If all of the above answers are "NO", then may proceed with Cephalosporin use.    Prednisone Rash      Social History   Socioeconomic History   Marital  status: Married    Spouse name: Not on file   Number of children: 0   Years of education: Not on file   Highest education level: Not on file  Occupational History   Occupation: [employer]  Tobacco Use   Smoking status: Never   Smokeless tobacco: Never  Vaping Use   Vaping Use: Never used  Substance and Sexual Activity   Alcohol use: Never   Drug use: Never   Sexual activity: Yes  Other Topics Concern   Not on file  Social History Narrative   Not on file   Social Determinants of Health   Financial Resource Strain: Medium Risk (08/19/2021)   Overall Financial Resource Strain (CARDIA)    Difficulty of Paying Living Expenses: Somewhat hard  Food Insecurity: Food Insecurity Present (08/19/2021)   Hunger Vital Sign    Worried About Running Out of Food in the Last Year: Sometimes true    Ran Out of Food in the Last Year: Never true  Transportation Needs: No Transportation Needs (08/19/2021)   PRAPARE - Hydrologist (Medical): No    Lack of Transportation (Non-Medical): No  Physical Activity: Not on file  Stress: Not on file  Social Connections: Not on file  Intimate Partner Violence: Not on file    Physical Exam      Future Appointments  Date Time Provider [place]  11/25/2021  9:00 AM MC-US 1 [facility]-US [facility] [place]  12/02/2021 12:00 PM [facility] PA/NP [facility] None  01/26/2022 10:00 AM  [name], Shaune Pascal, MD [place] None       [name], [facility] Paramedic  11/18/21

## 2021-11-19 ENCOUNTER — Other Ambulatory Visit (HOSPITAL_COMMUNITY): Payer: Self-pay | Admitting: Family Medicine

## 2021-11-20 LAB — BASIC METABOLIC PANEL
BUN/Creatinine Ratio: 32 — ABNORMAL HIGH (ref 9–23)
BUN: 102 mg/dL (ref 6–20)
CO2: 18 mmol/L — ABNORMAL LOW (ref 20–29)
Calcium: 9.8 mg/dL (ref 8.7–10.2)
Chloride: 90 mmol/L — ABNORMAL LOW (ref 96–106)
Creatinine, Ser: 3.22 mg/dL — ABNORMAL HIGH (ref 0.57–1.00)
Glucose: 191 mg/dL — ABNORMAL HIGH (ref 70–99)
Potassium: 5.5 mmol/L — ABNORMAL HIGH (ref 3.5–5.2)
Sodium: 132 mmol/L — ABNORMAL LOW (ref 134–144)
eGFR: 19 mL/min/{1.73_m2} — ABNORMAL LOW (ref >59)

## 2021-11-23 ENCOUNTER — Other Ambulatory Visit (HOSPITAL_COMMUNITY): Payer: Self-pay | Admitting: Family Medicine

## 2021-11-23 ENCOUNTER — Telehealth (HOSPITAL_COMMUNITY): Payer: Self-pay | Admitting: Family Medicine

## 2021-11-23 MED ORDER — LOKELMA 10 G PO PACK: 10.0000 g | PACK | Freq: Once | ORAL | 0 refills | Status: AC

## 2021-11-23 NOTE — Telephone Encounter (Signed)
Called [name] and discussed her lab results.   She understands to hold her torsemide, KCL, metolazone, Entresto and Furoscix and take Lokelma 10 g x 1 today.   She will go to [facility] on Thursday to get repeat  BMET.  Will add back GDMT after repeat labs reviewed.   Discussed Cardiomems implant process with Dr. Haroldine Laws. Will plan to admit and place Cardiomems after AKI resolved. Discussed with this Jennifer [name] and she is agreeable. Will plan on arranging admission at her follow up appt 12/02/21.   Allena Katz, FNP-BC

## 2021-11-25 ENCOUNTER — Ambulatory Visit (HOSPITAL_COMMUNITY)
Admission: RE | Admit: 2021-11-25 | Discharge: 2021-11-25 | Disposition: A | Discharge status: Home / Self Care | Payer: BC Managed Care – PPO | Source: Ambulatory Visit | Attending: Family Medicine | Admitting: Family Medicine

## 2021-11-25 ENCOUNTER — Other Ambulatory Visit (HOSPITAL_COMMUNITY): Payer: Self-pay | Admitting: Family Medicine

## 2021-11-25 ENCOUNTER — Other Ambulatory Visit (HOSPITAL_COMMUNITY): Payer: Self-pay

## 2021-11-25 DIAGNOSIS — R188 Other ascites: Secondary | ICD-10-CM

## 2021-11-25 NOTE — Progress Notes (Signed)
Paramedicine Encounter    Patient ID: Jennifer [name], female    DOB: 06-26-1990, 31 y.o.   MRN: [id number]  Per labs she is to stop her torsemide, entresto, potassium, metolazone and furoscix. And she needs to get lokelma--she said pharmacy had to order it and its ready today so she will get it today.  [name] wanting to go to gym-she asked if that was ok- I said as long as she feels ok and doesn't over-exert she just feels like she is weak.   She missed multiple doses of her meds this past week-4 AM doses and 3 PM doses. She reports that when she stopped the diuretics she just stopped everything.  Meds verified and pill box refilled.  She goes for labs tomor so I told her it would be up to her to place anything back in pill box that was held.  I will touch base with her on Tuesday and follow up.  She does report that she feels worse without taking those meds. She has sharp c/p that remains pretty constant. It does hurt when she moves.  Weight is stable. She does report a wet cough 2-3X a day like she does have fluid on her but weight stable.  No increased sob. No increased edema.  She does report she has to sleep extra propped up at night since stopping the meds.  Her HR is elevated however she had just sat down after getting on the dogs for barking out the window and was up and down a lot prior to my arrival doing things around the house so she hadnt had much time to rest before v/s taken. So she has not taken her carvedilol nor the corlanor or any meds the past few days. I advised her that those 2 meds specifically helps control rate.   She went for [place] today and goes to clinic next week and the plan is to admit her from clinic to get cardiomems placed.   Meds verified and pill box refilled.     BP 130/82   Pulse (!) 118   Resp 16   Wt 147 lb (66.7 kg)   SpO2 98%   BMI 31.81 kg/m  Weight yesterday-147 Last visit weight-147  Patient Care Team: Elinor Parkinson as PCP -  General (Physician Assistant) Jacelyn Pi, MD as PCP - Endocrinology (Endocrinology)  Patient Active Problem List   Diagnosis Date Noted   Hyperosmolar hyperglycemic state (HHS) ([place]) 08/26/2021   Small intestinal bacterial overgrowth (SIBO) 08/26/2021   Lower extremity edema 08/26/2021   Hyperkalemia 08/14/2021   Hx of insulin dependent diabetes mellitus 08/14/2021   Acute kidney failure ([place]) 05/13/2021   Chest pain 03/10/2021   Acute on chronic combined systolic and diastolic CHF (congestive heart failure) ([place]) 02/09/2021   History of astrocytoma of brain 02/09/2021   Severe hyperglycemia due to diabetes mellitus ([place]) 01/25/2021   Diarrhea    Elevated transaminase level    Acute combined systolic and diastolic heart failure (HCC)    Nonischemic cardiomyopathy ([place])    Acute decompensated heart failure (HCC)    Elevated liver enzymes    Acute renal failure superimposed on stage 3a chronic kidney disease ([place]) 11/26/2020   Abdominal pain 95/18/8416   Chronic systolic CHF (congestive heart failure) ([place]) 11/23/2020   Essential hypertension 11/23/2020   Hypertriglyceridemia 11/23/2020   DM (diabetes mellitus) ([place]) 10/10/2018   Hypothyroidism 10/10/2018    Current Outpatient Medications:    albuterol (VENTOLIN HFA) 108 (90  Base) MCG/ACT inhaler, Inhale 1-2 puffs into the lungs every 6 (six) hours as needed for wheezing or shortness of breath., Disp: , Rfl:    aspirin EC 81 MG EC tablet, Take 1 tablet (81 mg total) by mouth daily. Swallow whole., Disp: 30 tablet, Rfl: 1   carvedilol (COREG) 3.125 MG tablet, Take 1 tablet (3.125 mg total) by mouth 2 (two) times daily with a meal., Disp: 60 tablet, Rfl: 6   DULoxetine (CYMBALTA) 30 MG capsule, Take 30 mg by mouth at bedtime., Disp: , Rfl:    Evolocumab (REPATHA) 140 MG/ML SOSY, Inject 140 mg into the skin See admin instructions. Inject 140 mg subcutaneously every other Wednesday evening, Disp: , Rfl:    fenofibrate 160 MG  tablet, Take 1 tablet (160 mg total) by mouth daily., Disp: 30 tablet, Rfl: 1   insulin regular human CONCENTRATED (HUMULIN R) 500 UNIT/ML injection, Inject 100-260 Units into the skin See admin instructions. 270 units 3 times daily with each meal, 100 units at bedtime per patient, Disp: , Rfl:    ivabradine (CORLANOR) 7.5 MG TABS tablet, Take 1 tablet (7.5 mg total) by mouth 2 (two) times daily with a meal., Disp: 60 tablet, Rfl: 6   levothyroxine (SYNTHROID) 125 MCG tablet, Take 1 tablet (125 mcg total) by mouth daily at 6 (six) AM., Disp: 30 tablet, Rfl: 1   ofloxacin (OCUFLOX) 0.3 % ophthalmic solution, Place 1 drop into both eyes daily as needed (only take after eye injections per patient)., Disp: , Rfl:    ondansetron (ZOFRAN) 4 MG tablet, Take 4 mg by mouth every 8 (eight) hours as needed for nausea or vomiting., Disp: , Rfl:    pantoprazole (PROTONIX) 40 MG tablet, Take 40 mg by mouth daily., Disp: , Rfl:    pregabalin (LYRICA) 300 MG capsule, Take 300 mg by mouth 2 (two) times daily., Disp: , Rfl:    prochlorperazine (COMPAZINE) 5 MG tablet, Take 5 mg by mouth every 6 (six) hours as needed for nausea or vomiting., Disp: , Rfl:    rosuvastatin (CRESTOR) 40 MG tablet, Take 40 mg by mouth daily., Disp: , Rfl:    tretinoin (RETIN-A) 0.05 % cream, Apply 1 application topically at bedtime., Disp: , Rfl:    nitroGLYCERIN (NITROSTAT) 0.4 MG SL tablet, Place 1 tablet (0.4 mg total) under the tongue every 5 (five) minutes as needed for up to 25 days for chest pain. (Patient not taking: Reported on 11/12/2021), Disp: 25 tablet, Rfl: 3 Allergies  Allergen Reactions   Ketamine Other (See Comments)    In vegetative state for 15 minutes per pt   Fentanyl Nausea And Vomiting   Maitake Mushroom [Maitake] Itching    Itchy throat   Penicillin V Itching    Gi upset   Shellfish Allergy Other (See Comments)    Pt has never had shellfish but tested positive on allergy test. Pt states contrast in CT is okay    Morphine Itching and Rash   Penicillins Rash    Has patient had a PCN reaction causing immediate rash, facial/tongue/throat swelling, SOB or lightheadedness with hypotension: Y Has patient had a PCN reaction causing severe rash involving mucus membranes or skin necrosis: Y Has patient had a PCN reaction that required hospitalization: N Has patient had a PCN reaction occurring within the last 10 years: Y If all of the above answers are "NO", then may proceed with Cephalosporin use.    Prednisone Rash      Social History  Socioeconomic History   Marital status: Married    Spouse name: Not on file   Number of children: 0   Years of education: Not on file   Highest education level: Not on file  Occupational History   Occupation: [employer]  Tobacco Use   Smoking status: Never   Smokeless tobacco: Never  Vaping Use   Vaping Use: Never used  Substance and Sexual Activity   Alcohol use: Never   Drug use: Never   Sexual activity: Yes  Other Topics Concern   Not on file  Social History Narrative   Not on file   Social Determinants of Health   Financial Resource Strain: Medium Risk (08/19/2021)   Overall Financial Resource Strain (CARDIA)    Difficulty of Paying Living Expenses: Somewhat hard  Food Insecurity: Food Insecurity Present (08/19/2021)   Hunger Vital Sign    Worried About Running Out of Food in the Last Year: Sometimes true    Ran Out of Food in the Last Year: Never true  Transportation Needs: No Transportation Needs (08/19/2021)   PRAPARE - Hydrologist (Medical): No    Lack of Transportation (Non-Medical): No  Physical Activity: Not on file  Stress: Not on file  Social Connections: Not on file  Intimate Partner Violence: Not on file    Physical Exam      Future Appointments  Date Time Provider [place]  12/02/2021 12:00 PM [facility] PA/NP [facility] None  01/26/2022 10:00 AM [name], Shaune Pascal, MD [place] None        [name], [place] Paramedic  11/25/21

## 2021-11-30 ENCOUNTER — Other Ambulatory Visit (HOSPITAL_COMMUNITY): Payer: Self-pay | Admitting: Internal Medicine

## 2021-12-01 ENCOUNTER — Inpatient Hospital Stay (HOSPITAL_BASED_OUTPATIENT_CLINIC_OR_DEPARTMENT_OTHER)
Admission: EM | Admit: 2021-12-01 | Discharge: 2021-12-12 | DRG: 264 | Disposition: A | Discharge status: Home / Self Care | Payer: BC Managed Care – PPO | Attending: Internal Medicine | Admitting: Internal Medicine

## 2021-12-01 ENCOUNTER — Other Ambulatory Visit: Payer: Self-pay

## 2021-12-01 ENCOUNTER — Emergency Department (HOSPITAL_BASED_OUTPATIENT_CLINIC_OR_DEPARTMENT_OTHER): Payer: BC Managed Care – PPO | Admitting: Radiology

## 2021-12-01 ENCOUNTER — Encounter (HOSPITAL_BASED_OUTPATIENT_CLINIC_OR_DEPARTMENT_OTHER): Payer: Self-pay

## 2021-12-01 DIAGNOSIS — E1122 Type 2 diabetes mellitus with diabetic chronic kidney disease: Secondary | ICD-10-CM | POA: Diagnosis present

## 2021-12-01 DIAGNOSIS — N912 Amenorrhea, unspecified: Secondary | ICD-10-CM | POA: Diagnosis present

## 2021-12-01 DIAGNOSIS — N179 Acute kidney failure, unspecified: Secondary | ICD-10-CM | POA: Diagnosis present

## 2021-12-01 DIAGNOSIS — I2510 Atherosclerotic heart disease of native coronary artery without angina pectoris: Secondary | ICD-10-CM | POA: Diagnosis present

## 2021-12-01 DIAGNOSIS — Z91013 Allergy to seafood: Secondary | ICD-10-CM

## 2021-12-01 DIAGNOSIS — Z83438 Family history of other disorder of lipoprotein metabolism and other lipidemia: Secondary | ICD-10-CM

## 2021-12-01 DIAGNOSIS — Z85841 Personal history of malignant neoplasm of brain: Secondary | ICD-10-CM

## 2021-12-01 DIAGNOSIS — Z8249 Family history of ischemic heart disease and other diseases of the circulatory system: Secondary | ICD-10-CM

## 2021-12-01 DIAGNOSIS — R778 Other specified abnormalities of plasma proteins: Secondary | ICD-10-CM | POA: Diagnosis not present

## 2021-12-01 DIAGNOSIS — I5043 Acute on chronic combined systolic (congestive) and diastolic (congestive) heart failure: Secondary | ICD-10-CM | POA: Diagnosis present

## 2021-12-01 DIAGNOSIS — I5082 Biventricular heart failure: Secondary | ICD-10-CM | POA: Diagnosis present

## 2021-12-01 DIAGNOSIS — Z9221 Personal history of antineoplastic chemotherapy: Secondary | ICD-10-CM

## 2021-12-01 DIAGNOSIS — Z79899 Other long term (current) drug therapy: Secondary | ICD-10-CM

## 2021-12-01 DIAGNOSIS — E781 Pure hyperglyceridemia: Secondary | ICD-10-CM | POA: Diagnosis present

## 2021-12-01 DIAGNOSIS — E11649 Type 2 diabetes mellitus with hypoglycemia without coma: Secondary | ICD-10-CM | POA: Diagnosis present

## 2021-12-01 DIAGNOSIS — D61818 Other pancytopenia: Secondary | ICD-10-CM | POA: Diagnosis present

## 2021-12-01 DIAGNOSIS — E662 Morbid (severe) obesity with alveolar hypoventilation: Secondary | ICD-10-CM | POA: Diagnosis present

## 2021-12-01 DIAGNOSIS — E1169 Type 2 diabetes mellitus with other specified complication: Secondary | ICD-10-CM | POA: Diagnosis present

## 2021-12-01 DIAGNOSIS — N1832 Chronic kidney disease, stage 3b: Secondary | ICD-10-CM | POA: Diagnosis present

## 2021-12-01 DIAGNOSIS — Z880 Allergy status to penicillin: Secondary | ICD-10-CM

## 2021-12-01 DIAGNOSIS — E875 Hyperkalemia: Secondary | ICD-10-CM | POA: Diagnosis present

## 2021-12-01 DIAGNOSIS — E039 Hypothyroidism, unspecified: Secondary | ICD-10-CM | POA: Diagnosis present

## 2021-12-01 DIAGNOSIS — G8929 Other chronic pain: Secondary | ICD-10-CM | POA: Diagnosis present

## 2021-12-01 DIAGNOSIS — Z79890 Hormone replacement therapy (postmenopausal): Secondary | ICD-10-CM

## 2021-12-01 DIAGNOSIS — Z885 Allergy status to narcotic agent status: Secondary | ICD-10-CM

## 2021-12-01 DIAGNOSIS — N184 Chronic kidney disease, stage 4 (severe): Secondary | ICD-10-CM | POA: Diagnosis present

## 2021-12-01 DIAGNOSIS — I428 Other cardiomyopathies: Secondary | ICD-10-CM | POA: Diagnosis present

## 2021-12-01 DIAGNOSIS — Z881 Allergy status to other antibiotic agents status: Secondary | ICD-10-CM

## 2021-12-01 DIAGNOSIS — I5033 Acute on chronic diastolic (congestive) heart failure: Secondary | ICD-10-CM | POA: Diagnosis present

## 2021-12-01 DIAGNOSIS — E871 Hypo-osmolality and hyponatremia: Secondary | ICD-10-CM | POA: Diagnosis present

## 2021-12-01 DIAGNOSIS — D631 Anemia in chronic kidney disease: Secondary | ICD-10-CM | POA: Diagnosis present

## 2021-12-01 DIAGNOSIS — Z7982 Long term (current) use of aspirin: Secondary | ICD-10-CM

## 2021-12-01 DIAGNOSIS — Z833 Family history of diabetes mellitus: Secondary | ICD-10-CM

## 2021-12-01 DIAGNOSIS — E785 Hyperlipidemia, unspecified: Secondary | ICD-10-CM | POA: Diagnosis present

## 2021-12-01 DIAGNOSIS — Z91018 Allergy to other foods: Secondary | ICD-10-CM

## 2021-12-01 DIAGNOSIS — E8881 Metabolic syndrome: Secondary | ICD-10-CM | POA: Diagnosis present

## 2021-12-01 DIAGNOSIS — I130 Hypertensive heart and chronic kidney disease with heart failure and stage 1 through stage 4 chronic kidney disease, or unspecified chronic kidney disease: Secondary | ICD-10-CM | POA: Diagnosis not present

## 2021-12-01 DIAGNOSIS — I2721 Secondary pulmonary arterial hypertension: Secondary | ICD-10-CM | POA: Diagnosis present

## 2021-12-01 DIAGNOSIS — Z79818 Long term (current) use of other agents affecting estrogen receptors and estrogen levels: Secondary | ICD-10-CM

## 2021-12-01 DIAGNOSIS — E876 Hypokalemia: Secondary | ICD-10-CM | POA: Diagnosis present

## 2021-12-01 DIAGNOSIS — E1165 Type 2 diabetes mellitus with hyperglycemia: Secondary | ICD-10-CM | POA: Diagnosis present

## 2021-12-01 DIAGNOSIS — Z794 Long term (current) use of insulin: Secondary | ICD-10-CM

## 2021-12-01 DIAGNOSIS — K760 Fatty (change of) liver, not elsewhere classified: Secondary | ICD-10-CM | POA: Diagnosis present

## 2021-12-01 DIAGNOSIS — I509 Heart failure, unspecified: Secondary | ICD-10-CM

## 2021-12-01 DIAGNOSIS — N170 Acute kidney failure with tubular necrosis: Secondary | ICD-10-CM | POA: Diagnosis present

## 2021-12-01 DIAGNOSIS — I5023 Acute on chronic systolic (congestive) heart failure: Secondary | ICD-10-CM | POA: Diagnosis present

## 2021-12-01 DIAGNOSIS — Z91148 Patient's other noncompliance with medication regimen for other reason: Secondary | ICD-10-CM

## 2021-12-01 DIAGNOSIS — I10 Essential (primary) hypertension: Secondary | ICD-10-CM | POA: Diagnosis present

## 2021-12-01 DIAGNOSIS — Z888 Allergy status to other drugs, medicaments and biological substances status: Secondary | ICD-10-CM

## 2021-12-01 DIAGNOSIS — Z6831 Body mass index (BMI) 31.0-31.9, adult: Secondary | ICD-10-CM

## 2021-12-01 DIAGNOSIS — R079 Chest pain, unspecified: Principal | ICD-10-CM

## 2021-12-01 DIAGNOSIS — K861 Other chronic pancreatitis: Secondary | ICD-10-CM | POA: Diagnosis present

## 2021-12-01 LAB — BASIC METABOLIC PANEL
Anion gap: 13 (ref 5–15)
BUN: 54 mg/dL — ABNORMAL HIGH (ref 6–20)
CO2: 22 mmol/L (ref 22–32)
Calcium: 9.5 mg/dL (ref 8.9–10.3)
Chloride: 98 mmol/L (ref 98–111)
Creatinine, Ser: 2.12 mg/dL — ABNORMAL HIGH (ref 0.44–1.00)
GFR, Estimated: 32 mL/min — ABNORMAL LOW (ref >60)
Glucose, Bld: 301 mg/dL — ABNORMAL HIGH (ref 70–99)
Potassium: 4.5 mmol/L (ref 3.5–5.1)
Sodium: 133 mmol/L — ABNORMAL LOW (ref 135–145)

## 2021-12-01 LAB — CBC
HCT: 31.1 % — ABNORMAL LOW (ref 36.0–46.0)
Hemoglobin: 10.3 g/dL — ABNORMAL LOW (ref 12.0–15.0)
MCH: 28.5 pg (ref 26.0–34.0)
MCHC: 33.1 g/dL (ref 30.0–36.0)
MCV: 86.1 fL (ref 80.0–100.0)
Platelets: 147 10*3/uL — ABNORMAL LOW (ref 150–400)
RBC: 3.61 MIL/uL — ABNORMAL LOW (ref 3.87–5.11)
RDW: 14.9 % (ref 11.5–15.5)
WBC: 6.3 10*3/uL (ref 4.0–10.5)
nRBC: 0 % (ref 0.0–0.2)

## 2021-12-01 LAB — TROPONIN I (HIGH SENSITIVITY)
Troponin I (High Sensitivity): 33 ng/L — ABNORMAL HIGH (ref <18)
Troponin I (High Sensitivity): 56 ng/L — ABNORMAL HIGH (ref <18)

## 2021-12-01 LAB — PREGNANCY, URINE: Preg Test, Ur: NEGATIVE

## 2021-12-01 MED ORDER — ASPIRIN 81 MG PO CHEW
324.0000 mg | CHEWABLE_TABLET | Freq: Once | ORAL | Status: AC
  Administered 2021-12-01: 324 mg via ORAL
  Filled 2021-12-01: qty 4

## 2021-12-01 MED ORDER — NITROGLYCERIN 0.4 MG SL SUBL
0.4000 mg | SUBLINGUAL_TABLET | SUBLINGUAL | Status: AC | PRN
  Administered 2021-12-01 (×3): 0.4 mg via SUBLINGUAL
  Filled 2021-12-01: qty 1

## 2021-12-01 NOTE — ED Notes (Signed)
Pt resting, complaining of chest pain of a 9 on 1-10 scale, giving nitro

## 2021-12-01 NOTE — ED Triage Notes (Signed)
Reports left upper chest pain x 3 days when she awoke.   Sts she has stopped all cardiac meds due to having a high potassium last week. Took Lokelma but did not have labs rechecked.   Appt with cardiologist tomorrow.

## 2021-12-01 NOTE — ED Provider Notes (Signed)
[facility] EMERGENCY DEPT  Provider Note  CSN: [id number] Arrival date & time: 12/01/21 1956  History Chief Complaint  Patient presents with   Chest Pain    Sri Clegg is a 31 y.o. female with complex past medical history astrocytoma s/p resection in 1997 also has CHF followed by the [facility] clinic as well as [facility]. She also has CKD, PCOS, and hypertriglyceridemia. About 2 weeks ago she had some labs done showing a worsening renal function and mildly elevated K. She was given a dose of Lokelma and told to hold her CHF meds until she could have the labs redrawn a few days later. She reports the lab request was never sent in and so she has not had them rechecked yet. In the last few days she has had increasing L chest pain, described as sharp and pressure with some SOB. Not relieved with NTG. [name] mostly here to determine if she can go back on her CHF meds. She has had prior LHC showing RCA disease 80% otherwise no CAD.   Home Medications Prior to Admission medications   Medication Sig Start Date End Date Taking? Authorizing Provider  albuterol (VENTOLIN HFA) 108 (90 Base) MCG/ACT inhaler Inhale 1-2 puffs into the lungs every 6 (six) hours as needed for wheezing or shortness of breath. 12/31/18   [provider]  aspirin EC 81 MG EC tablet Take 1 tablet (81 mg total) by mouth daily. Swallow whole. 12/07/20   Ezekiel Slocumb, DO  carvedilol (COREG) 3.125 MG tablet Take 1 tablet (3.125 mg total) by mouth 2 (two) times daily with a meal. 09/13/21   [place], Maricela Bo, FNP  DULoxetine (CYMBALTA) 30 MG capsule Take 30 mg by mouth at bedtime. 01/28/19 12/16/21  [provider]  Evolocumab (REPATHA) 140 MG/ML SOSY Inject 140 mg into the skin See admin instructions. Inject 140 mg subcutaneously every other Wednesday evening    [provider]  fenofibrate 160 MG tablet Take 1 tablet (160 mg total) by mouth daily. 12/07/20   Nicole Kindred A, DO   insulin regular human CONCENTRATED (HUMULIN R) 500 UNIT/ML injection Inject 100-260 Units into the skin See admin instructions. 270 units 3 times daily with each meal, 100 units at bedtime per patient    [provider]  ivabradine (CORLANOR) 7.5 MG TABS tablet Take 1 tablet (7.5 mg total) by mouth 2 (two) times daily with a meal. 08/02/21   [name], Shaune Pascal, MD  levothyroxine (SYNTHROID) 125 MCG tablet Take 1 tablet (125 mcg total) by mouth daily at 6 (six) AM. 12/07/20   Ezekiel Slocumb, DO  nitroGLYCERIN (NITROSTAT) 0.4 MG SL tablet Place 1 tablet (0.4 mg total) under the tongue every 5 (five) minutes as needed for up to 25 days for chest pain. Patient not taking: Reported on 11/12/2021 06/16/21 08/03/22  [name], Shaune Pascal, MD  ofloxacin (OCUFLOX) 0.3 % ophthalmic solution Place 1 drop into both eyes daily as needed (only take after eye injections per patient). 09/02/20   [provider]  ondansetron (ZOFRAN) 4 MG tablet Take 4 mg by mouth every 8 (eight) hours as needed for nausea or vomiting. 12/19/18   [provider]  pantoprazole (PROTONIX) 40 MG tablet Take 40 mg by mouth daily. 12/24/19   [provider]  pregabalin (LYRICA) 300 MG capsule Take 300 mg by mouth 2 (two) times daily.    [provider]  prochlorperazine (COMPAZINE) 5 MG tablet Take 5 mg by mouth every  6 (six) hours as needed for nausea or vomiting.    [provider]  rosuvastatin (CRESTOR) 40 MG tablet Take 40 mg by mouth daily. 08/09/21   [provider]  tretinoin (RETIN-A) 0.05 % cream Apply 1 application topically at bedtime. 01/17/21   [provider]     Allergies    Ketamine, Erythromycin, Fentanyl, Maitake mushroom [maitake], Penicillin v, Shellfish allergy, Morphine, Penicillins, and Prednisone   Review of Systems   Review of Systems Please see HPI for pertinent positives and negatives  Physical Exam BP (!) 144/95   Pulse (!) 106   Temp  98.2 F (36.8 C)   Resp 20   Ht 4' 9"  (1.448 m)   Wt 66.7 kg   SpO2 95%   BMI 31.81 kg/m   Physical Exam Vitals and nursing note reviewed.  Constitutional:      Appearance: Normal appearance.  HENT:     Head: Normocephalic and atraumatic.     Nose: Nose normal.     Mouth/Throat:     Mouth: Mucous membranes are moist.  Eyes:     Extraocular Movements: Extraocular movements intact.     Conjunctiva/sclera: Conjunctivae normal.  Cardiovascular:     Rate and Rhythm: Normal rate.     Heart sounds: No murmur heard. Pulmonary:     Effort: Pulmonary effort is normal.     Breath sounds: Normal breath sounds.  Abdominal:     General: Abdomen is flat.     Palpations: Abdomen is soft.     Tenderness: There is no abdominal tenderness.  Musculoskeletal:        General: No swelling. Normal range of motion.     Cervical back: Neck supple.     Right lower leg: Edema (at baseline) present.     Left lower leg: No edema.  Skin:    General: Skin is warm and dry.  Neurological:     General: No focal deficit present.     Mental Status: [name] alert.  Psychiatric:        Mood and Affect: Mood normal.     ED Results / Procedures / Treatments   EKG EKG Interpretation  Date/Time:  Wednesday December 01 2021 20:16:07 EDT Ventricular Rate:  117 PR Interval:  120 QRS Duration: 82 QT Interval:  336 QTC Calculation: 468 R Axis:   36 Text Interpretation: Sinus tachycardia Otherwise normal ECG When compared with ECG of 01-Sep-2021 21:19, Non-specific change in ST segment in Inferior leads Confirmed by Regan Lemming ([id number]) on 12/01/2021 9:48:05 PM  Procedures Procedures  Medications Ordered in the ED Medications  aspirin chewable tablet 324 mg (324 mg Oral Given 12/01/21 2340)  nitroGLYCERIN (NITROSTAT) SL tablet 0.4 mg (0.4 mg Sublingual Given 12/01/21 2353)  HYDROmorphone (DILAUDID) injection 1 mg (1 mg Intravenous Given 12/02/21 0044)    Initial Impression and Plan  Patient with a  complex history here with increasing chest pain for several days. She has tried NTG at home without relief. Will be given NTG and ASA here. Labs done in triage showed CBC with mild anemia. BMP with CKD improved from previous, K improved frm previous. Moderate hyperglycemia without acidosis. Initial Trop is 33. Awaiting repeat Trop.   ED Course   Clinical Course as of 12/02/21 0406  Wed Dec 01, 2021  2334 Repeat Trop continues to go up, awaiting her NTG to see if helps her pain. Will discuss with Cardiology on call.  [CS]  Thu Dec 02, 2021  0002  Some improvement in pain after first NTG.  [CS]  2111 Spoke with Dr. Wilmon Pali, Cardiology, who requests a repeat EKG, BNP and UDS. He would like the patient to be admitted to Hospitalist with Cardiology consult. Does not recommend Heparin or NTG drips at this time.  [CS]    Clinical Course User Index [CS] Truddie Hidden, MD     MDM Rules/Calculators/A&P Medical Decision Making Problems Addressed: Chest pain, unspecified type: acute illness or injury Elevated troponin: acute illness or injury  Amount and/or Complexity of Data Reviewed Labs: ordered. Decision-making details documented in ED Course. Radiology: ordered and independent interpretation performed. Decision-making details documented in ED Course. ECG/medicine tests: ordered and independent interpretation performed. Decision-making details documented in ED Course.  Risk OTC drugs. Prescription drug management. Parenteral controlled substances. Decision regarding hospitalization.    Final Clinical Impression(s) / ED Diagnoses Final diagnoses:  Chest pain, unspecified type  Elevated troponin    Rx / DC Orders ED Discharge Orders     None        Truddie Hidden, MD 12/02/21 [id number]

## 2021-12-02 ENCOUNTER — Other Ambulatory Visit: Payer: Self-pay

## 2021-12-02 ENCOUNTER — Encounter (HOSPITAL_COMMUNITY): Payer: Self-pay

## 2021-12-02 ENCOUNTER — Encounter (HOSPITAL_COMMUNITY): Payer: BC Managed Care – PPO

## 2021-12-02 DIAGNOSIS — E875 Hyperkalemia: Secondary | ICD-10-CM | POA: Diagnosis not present

## 2021-12-02 DIAGNOSIS — I5023 Acute on chronic systolic (congestive) heart failure: Secondary | ICD-10-CM | POA: Diagnosis present

## 2021-12-02 DIAGNOSIS — N184 Chronic kidney disease, stage 4 (severe): Secondary | ICD-10-CM | POA: Diagnosis not present

## 2021-12-02 DIAGNOSIS — I2721 Secondary pulmonary arterial hypertension: Secondary | ICD-10-CM | POA: Diagnosis not present

## 2021-12-02 DIAGNOSIS — K760 Fatty (change of) liver, not elsewhere classified: Secondary | ICD-10-CM | POA: Diagnosis not present

## 2021-12-02 DIAGNOSIS — E781 Pure hyperglyceridemia: Secondary | ICD-10-CM

## 2021-12-02 DIAGNOSIS — Z85841 Personal history of malignant neoplasm of brain: Secondary | ICD-10-CM

## 2021-12-02 DIAGNOSIS — Z79899 Other long term (current) drug therapy: Secondary | ICD-10-CM | POA: Diagnosis not present

## 2021-12-02 DIAGNOSIS — E785 Hyperlipidemia, unspecified: Secondary | ICD-10-CM | POA: Diagnosis not present

## 2021-12-02 DIAGNOSIS — I428 Other cardiomyopathies: Secondary | ICD-10-CM | POA: Diagnosis not present

## 2021-12-02 DIAGNOSIS — E1165 Type 2 diabetes mellitus with hyperglycemia: Secondary | ICD-10-CM | POA: Diagnosis not present

## 2021-12-02 DIAGNOSIS — I130 Hypertensive heart and chronic kidney disease with heart failure and stage 1 through stage 4 chronic kidney disease, or unspecified chronic kidney disease: Secondary | ICD-10-CM | POA: Diagnosis not present

## 2021-12-02 DIAGNOSIS — I5033 Acute on chronic diastolic (congestive) heart failure: Secondary | ICD-10-CM | POA: Diagnosis present

## 2021-12-02 DIAGNOSIS — D631 Anemia in chronic kidney disease: Secondary | ICD-10-CM | POA: Diagnosis not present

## 2021-12-02 DIAGNOSIS — R109 Unspecified abdominal pain: Secondary | ICD-10-CM | POA: Diagnosis not present

## 2021-12-02 DIAGNOSIS — R0789 Other chest pain: Secondary | ICD-10-CM

## 2021-12-02 DIAGNOSIS — E1122 Type 2 diabetes mellitus with diabetic chronic kidney disease: Secondary | ICD-10-CM | POA: Diagnosis not present

## 2021-12-02 DIAGNOSIS — E1169 Type 2 diabetes mellitus with other specified complication: Secondary | ICD-10-CM | POA: Diagnosis not present

## 2021-12-02 DIAGNOSIS — Z794 Long term (current) use of insulin: Secondary | ICD-10-CM | POA: Diagnosis not present

## 2021-12-02 DIAGNOSIS — I5020 Unspecified systolic (congestive) heart failure: Secondary | ICD-10-CM

## 2021-12-02 DIAGNOSIS — E876 Hypokalemia: Secondary | ICD-10-CM | POA: Diagnosis not present

## 2021-12-02 DIAGNOSIS — D61818 Other pancytopenia: Secondary | ICD-10-CM

## 2021-12-02 DIAGNOSIS — R7989 Other specified abnormal findings of blood chemistry: Secondary | ICD-10-CM | POA: Insufficient documentation

## 2021-12-02 DIAGNOSIS — I10 Essential (primary) hypertension: Secondary | ICD-10-CM

## 2021-12-02 DIAGNOSIS — N189 Chronic kidney disease, unspecified: Secondary | ICD-10-CM

## 2021-12-02 DIAGNOSIS — R778 Other specified abnormalities of plasma proteins: Secondary | ICD-10-CM | POA: Diagnosis present

## 2021-12-02 DIAGNOSIS — I5043 Acute on chronic combined systolic (congestive) and diastolic (congestive) heart failure: Secondary | ICD-10-CM | POA: Diagnosis present

## 2021-12-02 DIAGNOSIS — G8929 Other chronic pain: Secondary | ICD-10-CM | POA: Diagnosis not present

## 2021-12-02 DIAGNOSIS — E662 Morbid (severe) obesity with alveolar hypoventilation: Secondary | ICD-10-CM | POA: Diagnosis not present

## 2021-12-02 DIAGNOSIS — N179 Acute kidney failure, unspecified: Secondary | ICD-10-CM | POA: Diagnosis not present

## 2021-12-02 DIAGNOSIS — E871 Hypo-osmolality and hyponatremia: Secondary | ICD-10-CM | POA: Diagnosis not present

## 2021-12-02 DIAGNOSIS — E1059 Type 1 diabetes mellitus with other circulatory complications: Secondary | ICD-10-CM

## 2021-12-02 DIAGNOSIS — I5082 Biventricular heart failure: Secondary | ICD-10-CM | POA: Diagnosis not present

## 2021-12-02 DIAGNOSIS — N170 Acute kidney failure with tubular necrosis: Secondary | ICD-10-CM | POA: Diagnosis not present

## 2021-12-02 DIAGNOSIS — N912 Amenorrhea, unspecified: Secondary | ICD-10-CM

## 2021-12-02 DIAGNOSIS — I25119 Atherosclerotic heart disease of native coronary artery with unspecified angina pectoris: Secondary | ICD-10-CM | POA: Diagnosis not present

## 2021-12-02 DIAGNOSIS — E039 Hypothyroidism, unspecified: Secondary | ICD-10-CM | POA: Diagnosis not present

## 2021-12-02 DIAGNOSIS — E11649 Type 2 diabetes mellitus with hypoglycemia without coma: Secondary | ICD-10-CM | POA: Diagnosis not present

## 2021-12-02 DIAGNOSIS — K861 Other chronic pancreatitis: Secondary | ICD-10-CM | POA: Diagnosis not present

## 2021-12-02 LAB — BASIC METABOLIC PANEL
Anion gap: 11 (ref 5–15)
BUN: 48 mg/dL — ABNORMAL HIGH (ref 6–20)
CO2: 23 mmol/L (ref 22–32)
Calcium: 8.5 mg/dL — ABNORMAL LOW (ref 8.9–10.3)
Chloride: 104 mmol/L (ref 98–111)
Creatinine, Ser: 1.76 mg/dL — ABNORMAL HIGH (ref 0.44–1.00)
GFR, Estimated: 39 mL/min — ABNORMAL LOW (ref >60)
Glucose, Bld: 194 mg/dL — ABNORMAL HIGH (ref 70–99)
Potassium: 4.5 mmol/L (ref 3.5–5.1)
Sodium: 138 mmol/L (ref 135–145)

## 2021-12-02 LAB — RAPID URINE DRUG SCREEN, HOSP PERFORMED
Amphetamines: NOT DETECTED
Barbiturates: NOT DETECTED
Benzodiazepines: NOT DETECTED
Cocaine: NOT DETECTED
Opiates: NOT DETECTED
Tetrahydrocannabinol: NOT DETECTED

## 2021-12-02 LAB — CBC
HCT: 28.1 % — ABNORMAL LOW (ref 36.0–46.0)
Hemoglobin: 9.1 g/dL — ABNORMAL LOW (ref 12.0–15.0)
MCH: 28.1 pg (ref 26.0–34.0)
MCHC: 32.4 g/dL (ref 30.0–36.0)
MCV: 86.7 fL (ref 80.0–100.0)
Platelets: 114 10*3/uL — ABNORMAL LOW (ref 150–400)
RBC: 3.24 MIL/uL — ABNORMAL LOW (ref 3.87–5.11)
RDW: 15 % (ref 11.5–15.5)
WBC: 5.6 10*3/uL (ref 4.0–10.5)
nRBC: 0 % (ref 0.0–0.2)

## 2021-12-02 LAB — GLUCOSE, CAPILLARY
Glucose-Capillary: 272 mg/dL — ABNORMAL HIGH (ref 70–99)
Glucose-Capillary: 357 mg/dL — ABNORMAL HIGH (ref 70–99)

## 2021-12-02 LAB — BRAIN NATRIURETIC PEPTIDE: B Natriuretic Peptide: 41.3 pg/mL (ref 0.0–100.0)

## 2021-12-02 LAB — AMYLASE: Amylase: 12 U/L — ABNORMAL LOW (ref 28–100)

## 2021-12-02 LAB — LIPASE, BLOOD: Lipase: 28 U/L (ref 11–51)

## 2021-12-02 LAB — TROPONIN I (HIGH SENSITIVITY): Troponin I (High Sensitivity): 68 ng/L — ABNORMAL HIGH (ref <18)

## 2021-12-02 LAB — TRIGLYCERIDES: Triglycerides: 150 mg/dL — ABNORMAL HIGH (ref <150)

## 2021-12-02 LAB — CBG MONITORING, ED: Glucose-Capillary: 172 mg/dL — ABNORMAL HIGH (ref 70–99)

## 2021-12-02 MED ORDER — IVABRADINE HCL 7.5 MG PO TABS
7.5000 mg | ORAL_TABLET | Freq: Two times a day (BID) | ORAL | Status: DC
  Administered 2021-12-02 – 2021-12-06 (×9): 7.5 mg via ORAL
  Filled 2021-12-02 (×10): qty 1

## 2021-12-02 MED ORDER — HYDROMORPHONE HCL 1 MG/ML IJ SOLN
1.0000 mg | Freq: Once | INTRAMUSCULAR | Status: AC
  Administered 2021-12-02: 1 mg via INTRAVENOUS
  Filled 2021-12-02: qty 1

## 2021-12-02 MED ORDER — SACUBITRIL-VALSARTAN 24-26 MG PO TABS
1.0000 | ORAL_TABLET | Freq: Two times a day (BID) | ORAL | Status: DC
  Administered 2021-12-02 – 2021-12-06 (×8): 1 via ORAL
  Filled 2021-12-02 (×8): qty 1

## 2021-12-02 MED ORDER — SODIUM CHLORIDE 0.9 % IV SOLN
12.5000 mg | Freq: Four times a day (QID) | INTRAVENOUS | Status: DC | PRN
  Administered 2021-12-02: 12.5 mg via INTRAVENOUS
  Filled 2021-12-02: qty 0.5

## 2021-12-02 MED ORDER — ALBUTEROL SULFATE HFA 108 (90 BASE) MCG/ACT IN AERS: 1.0000 | INHALATION_SPRAY | Freq: Four times a day (QID) | RESPIRATORY_TRACT | Status: DC | PRN

## 2021-12-02 MED ORDER — ONDANSETRON HCL 4 MG/2ML IJ SOLN
4.0000 mg | Freq: Once | INTRAMUSCULAR | Status: AC
  Administered 2021-12-02: 4 mg via INTRAVENOUS
  Filled 2021-12-02: qty 2

## 2021-12-02 MED ORDER — INSULIN ASPART 100 UNIT/ML IJ SOLN
0.0000 [IU] | INTRAMUSCULAR | Status: DC
  Administered 2021-12-02: 20 [IU] via SUBCUTANEOUS
  Administered 2021-12-02: 11 [IU] via SUBCUTANEOUS
  Administered 2021-12-03 (×2): 7 [IU] via SUBCUTANEOUS
  Administered 2021-12-03 (×2): 15 [IU] via SUBCUTANEOUS

## 2021-12-02 MED ORDER — CARVEDILOL 3.125 MG PO TABS
3.1250 mg | ORAL_TABLET | Freq: Two times a day (BID) | ORAL | Status: DC
  Administered 2021-12-02 – 2021-12-06 (×9): 3.125 mg via ORAL
  Filled 2021-12-02 (×10): qty 1

## 2021-12-02 MED ORDER — ASPIRIN 81 MG PO TBEC
81.0000 mg | DELAYED_RELEASE_TABLET | Freq: Every day | ORAL | Status: DC
  Administered 2021-12-02 – 2021-12-12 (×10): 81 mg via ORAL
  Filled 2021-12-02 (×11): qty 1

## 2021-12-02 MED ORDER — ENOXAPARIN SODIUM 40 MG/0.4ML IJ SOSY
40.0000 mg | PREFILLED_SYRINGE | INTRAMUSCULAR | Status: DC
Start: 2021-12-02 — End: 2021-12-04
  Administered 2021-12-02 – 2021-12-03 (×2): 40 mg via SUBCUTANEOUS
  Filled 2021-12-02 (×2): qty 0.4

## 2021-12-02 MED ORDER — FUROSEMIDE 10 MG/ML IJ SOLN
80.0000 mg | Freq: Once | INTRAMUSCULAR | Status: AC
  Administered 2021-12-02: 80 mg via INTRAVENOUS
  Filled 2021-12-02: qty 8

## 2021-12-02 MED ORDER — OXYCODONE-ACETAMINOPHEN 5-325 MG PO TABS
1.0000 | ORAL_TABLET | Freq: Four times a day (QID) | ORAL | Status: DC | PRN
  Administered 2021-12-02 – 2021-12-10 (×6): 1 via ORAL
  Filled 2021-12-02 (×6): qty 1

## 2021-12-02 MED ORDER — PANTOPRAZOLE SODIUM 40 MG PO TBEC
40.0000 mg | DELAYED_RELEASE_TABLET | Freq: Every day | ORAL | Status: DC
  Administered 2021-12-02 – 2021-12-12 (×11): 40 mg via ORAL
  Filled 2021-12-02 (×11): qty 1

## 2021-12-02 MED ORDER — ROSUVASTATIN CALCIUM 20 MG PO TABS
40.0000 mg | ORAL_TABLET | Freq: Every day | ORAL | Status: DC
  Administered 2021-12-03 – 2021-12-12 (×10): 40 mg via ORAL
  Filled 2021-12-02: qty 1
  Filled 2021-12-02 (×10): qty 2

## 2021-12-02 MED ORDER — HYDROMORPHONE HCL 1 MG/ML IJ SOLN
1.0000 mg | INTRAMUSCULAR | Status: AC | PRN
  Administered 2021-12-03 (×2): 1 mg via INTRAVENOUS
  Filled 2021-12-02 (×2): qty 1

## 2021-12-02 MED ORDER — PROMETHAZINE HCL 25 MG/ML IJ SOLN
INTRAMUSCULAR | Status: AC
  Filled 2021-12-02: qty 1

## 2021-12-02 MED ORDER — ALBUTEROL SULFATE (2.5 MG/3ML) 0.083% IN NEBU: 2.5000 mg | INHALATION_SOLUTION | Freq: Four times a day (QID) | RESPIRATORY_TRACT | Status: DC | PRN

## 2021-12-02 MED ORDER — FENOFIBRATE 160 MG PO TABS
160.0000 mg | ORAL_TABLET | Freq: Every day | ORAL | Status: DC
  Administered 2021-12-03 – 2021-12-12 (×10): 160 mg via ORAL
  Filled 2021-12-02 (×11): qty 1

## 2021-12-02 MED ORDER — SODIUM CHLORIDE 0.9 % IV BOLUS
1000.0000 mL | Freq: Once | INTRAVENOUS | Status: AC
  Administered 2021-12-02: 1000 mL via INTRAVENOUS

## 2021-12-02 MED ORDER — PREGABALIN 100 MG PO CAPS
300.0000 mg | ORAL_CAPSULE | Freq: Two times a day (BID) | ORAL | Status: DC
  Administered 2021-12-02 – 2021-12-04 (×5): 300 mg via ORAL
  Filled 2021-12-02 (×4): qty 3
  Filled 2021-12-02: qty 4

## 2021-12-02 MED ORDER — DULOXETINE HCL 30 MG PO CPEP
30.0000 mg | ORAL_CAPSULE | Freq: Every day | ORAL | Status: DC
  Administered 2021-12-02 – 2021-12-11 (×10): 30 mg via ORAL
  Filled 2021-12-02 (×10): qty 1

## 2021-12-02 MED ORDER — LEVOTHYROXINE SODIUM 25 MCG PO TABS
125.0000 ug | ORAL_TABLET | Freq: Every day | ORAL | Status: DC
  Administered 2021-12-02 – 2021-12-12 (×11): 125 ug via ORAL
  Filled 2021-12-02 (×11): qty 1

## 2021-12-02 NOTE — ED Notes (Signed)
Patient offered oatmeal after medication administration. [name] sitting comfortably watching TV

## 2021-12-02 NOTE — ED Notes (Addendum)
Patient eating oatmeal at the moment

## 2021-12-02 NOTE — ED Notes (Signed)
Blood glucose of 172

## 2021-12-02 NOTE — Progress Notes (Signed)
Pt admitted to 6E AxOx4, VS wnL and as per flow. Pt oriented to 6E processes. Pt familiar with the [facility] system. All questions and concerns addressed. Call bell placed within reach, will continue to monitor and maintain safety.  ?

## 2021-12-02 NOTE — Consult Note (Signed)
Cardiology Consultation   Patient ID: [name] MRN: [id number]; DOB: May 23, 1990  Admit date: 12/01/2021 Date of Consult: 12/02/2021  PCP:  Jennifer [name], [place] Providers Cardiologist:  None   {   Patient Profile:   Jennifer [name] is [name] 31 y.o. 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 [place] [name] .  History of Present Illness:   Jennifer [name] is [name] 31 year old female with the medical history detailed above who is followed by Jennifer [name] as an outpatient. Per review of the record, the patient was admitted 11/22/20 with abdominal pain and diarrhea. Abdominal CT concerning for vasculitis (no pancreatitis). Her TG were found to be  4030. She was started on an insulin/dextrose infusion. Cardiology and Nephrology were consulted after her creatinine and LFTs continued to rise and she began developing hypotension. Echo 11/28/20 EF was found to be 20-25% with moderate RV dysfunction and mild septal flattening. Started on DBA 2.5. AHF consulted for further management. PICC placed to follow CVP and Co-Ox. BP were high and milrinone added to augment diuresis. cMRI c/w prior myocarditis vs sarcoid. Mod pericardial effusion also noted. LVEF 29%. RV normal. She underwent R/LHC showing single vessel RCA occlusion, RA 1, LVEDP 17, Fick CO 5.6/CI 3.88. She was able to be weaned off inotropes and GDMT titrated.    Post hospital follow up she was drinking >2L/day fluid and eating more salty foods, volume was up, ReDs 43%, instructed to increase lasix to 20 mg bid x 3 days. She followed up with Jennifer [name] with [facility] 12/17/20 for [name] 2nd opinion. He placed LifeVest and switched beta blocker to Toprol XL.  Follow-up TTE in 12/2020  with EF up to 45-50%, grade II DD, RV okay. Life Vest off. Remained at 45% on TTE 02/2021.  Show Low 5/23 with elevated filling pressures and normal cardiac ouput. Mild to moderate mixed pulmonary hypertension with prominent v-waves in RA, suggestive of significant TR. Given IV lasix and metolazone 2.5 added weekly to diuretic regimen.    RA = 12 prominent V waves RV = 47/14 PA = 48/25 (37) PCW = 25 Fick cardiac output/index = 4.4/2.8 PVR =3.7 WU Ao sat = 99% PA sat = 62%, 62%  Was seen in follow-up where REDs 43% and torsemide was increased and weekly metolazone was added. She then was admitted 07/2021 with AKI and hyperkalemia. Given IVF, insulin, calcium gluconate and Lokelma. Echo showed EF 45-50%, RV normal, mild-mod MR, RVSP 30. Only mild TR. Restarted on torsemide 40 bid (lower dose) and Entresto 49/51 bid.   Had multiple follow-ups in HF clinic where she required intermitted furoscix for volume overload. Was last seen 11/12/21 where she continued to be volume up with REDs 42%. Her torsemide was increased to 56m BID and was recommended for metolazone 2.585mx2 days and then every Friday thereafter. Cr bumped to 3.22 and K went to 5.5 and her torsemide, metolazone, entresto and furoscix were held and she was recommended for [name] dose of Lokelma. She was planned for admission for cardiomems but returned to the ER with chest pain as detailed below.  The patient states that she was having difficulty getting her Lokelma from the pharmacy and therefore delayed her repeat labs. She has  been off her diuretics for several days. She was supposed to go to HF clinic today but developed worsening sharp left sided chest pain that was nonexertional in nature and SOBlast night prompting her to got to [facility].  In the ER, Trop 33>56>68. Cr 2.12>1.76. K 4.5. ECG with sinus tachycardia with no acute ischemic changes. UDS negative. CXR with no cardiopulmonary disease. Given her medical complexity, known CAD and  HF, the patient was transferred to [facility] for further work-up.  Past Medical History:  Diagnosis Date   Brain tumor ([place]) 03/29/1995   astrocytoma   CHF (congestive heart failure) (HCC)    Cholesterosis    DM (diabetes mellitus) ([place]) 10/10/2018   Fatty liver    HTN (hypertension) 10/10/2018   Hypothyroidism 10/10/2018   Lipoprotein deficiency    Lung disease    longevity long term   Pancreatitis    Polycystic ovary syndrome     Past Surgical History:  Procedure Laterality Date   ABDOMINAL SURGERY     pt states during miscarriage got her intestine   BRAIN SURGERY     EYE MUSCLE SURGERY Right 03/28/2014   RIGHT HEART CATH N/[name] 08/05/2021   Procedure: RIGHT HEART CATH;  Surgeon: Jennifer Artist, MD;  Location: [facility];  Service: Cardiovascular;  Laterality: N/[name];   RIGHT/LEFT HEART CATH AND CORONARY ANGIOGRAPHY N/[name] 12/03/2020   Procedure: RIGHT/LEFT HEART CATH AND CORONARY ANGIOGRAPHY;  Surgeon: Jennifer Prows, MD;  Location: [facility];  Service: Cardiovascular;  Laterality: N/[name];   VENTRICULOSTOMY  03/28/1997     Home Medications:  Prior to Admission medications   Medication Sig Start Date End Date Taking? Authorizing Provider  albuterol (VENTOLIN HFA) 108 (90 Base) MCG/ACT inhaler Inhale 1-2 puffs into the lungs every 6 (six) hours as needed for wheezing or shortness of breath. 12/31/18  Yes [provider]  aspirin EC 81 MG EC tablet Take 1 tablet (81 mg total) by mouth daily. Swallow whole. 12/07/20  Yes Jennifer Kindred A, DO  carvedilol (COREG) 3.125 MG tablet Take 1 tablet (3.125 mg total) by mouth 2 (two) times daily with [name] meal. 09/13/21  Yes Jennifer [name], Maricela Bo, FNP  dicyclomine (BENTYL) 10 MG capsule Take 10 mg by mouth daily.   Yes [provider]  DULoxetine (CYMBALTA) 30 MG capsule Take 30 mg by mouth at bedtime. 01/28/19 12/16/21 Yes [provider]  erythromycin ophthalmic ointment    Yes [provider]  estradiol  (ESTRACE) 0.5 MG tablet Take 0.5 mg by mouth daily. 11/17/21  Yes [provider]  Evolocumab (REPATHA) 140 MG/ML SOSY Inject 140 mg into the skin See admin instructions. Inject 140 mg subcutaneously every other Wednesday evening   Yes [provider]  fenofibrate 160 MG tablet Take 1 tablet (160 mg total) by mouth daily. 12/07/20  Yes Jennifer Kindred A, DO  insulin regular human CONCENTRATED (HUMULIN R) 500 UNIT/ML injection Inject 100-260 Units into the skin See admin instructions. 270 units 3 times daily with each meal, 100 units at bedtime per patient   Yes [provider]  ivabradine (CORLANOR) 7.5 MG TABS tablet Take 1 tablet (7.5 mg total) by mouth 2 (two) times daily with [name] meal. 08/02/21  Yes [name], Shaune Pascal, MD  levothyroxine (SYNTHROID) 125 MCG tablet Take 1 tablet (125 mcg total) by mouth daily at 6 (six) AM. 12/07/20  Yes Jennifer Kindred A, DO  metolazone (ZAROXOLYN) 2.5 MG tablet Take 2.5 mg by mouth once [name] week.  Yes [provider]  ondansetron (ZOFRAN) 4 MG tablet Take 4 mg by mouth every 8 (eight) hours as needed for nausea or vomiting. 12/19/18  Yes [provider]  pantoprazole (PROTONIX) 40 MG tablet Take 40 mg by mouth daily. 12/24/19  Yes [provider]  Potassium Chloride ER 20 MEQ TBCR Take 40 mEq by mouth once [name] week. 10/24/21  Yes [provider]  pregabalin (LYRICA) 300 MG capsule Take 300 mg by mouth 2 (two) times daily.   Yes [provider]  prochlorperazine (COMPAZINE) 5 MG tablet Take 5 mg by mouth every 6 (six) hours as needed for nausea or vomiting.   Yes [provider]  rosuvastatin (CRESTOR) 40 MG tablet Take 40 mg by mouth daily. 08/09/21  Yes [provider]  torsemide (DEMADEX) 20 MG tablet Take 60 mg by mouth in the morning and at bedtime. 09/27/21  Yes [provider]  tretinoin (RETIN-[name]) 0.05 % cream Apply 1 application topically at bedtime. 01/17/21  Yes [provider]  [name] 1/20 1-20 MG-MCG tablet Take 1 tablet by mouth daily. 11/01/21   [provider]  medroxyPROGESTERone (PROVERA) 10 MG tablet Take 10 mg by mouth daily. 11/17/21   [provider]  nitroGLYCERIN (NITROSTAT) 0.4 MG SL tablet Place 1 tablet (0.4 mg total) under the tongue every 5 (five) minutes as needed for up to 25 days for chest pain. Patient not taking: Reported on 11/12/2021 06/16/21 08/03/22  [name], Shaune Pascal, MD  ofloxacin (OCUFLOX) 0.3 % ophthalmic solution Place 1 drop into both eyes daily as needed (only take after eye injections per patient). 09/02/20   [provider]    Inpatient Medications: Scheduled Meds:  aspirin EC  81 mg Oral Daily   carvedilol  3.125 mg Oral BID   DULoxetine  30 mg Oral QHS   fenofibrate  160 mg Oral Daily   ivabradine  7.5 mg Oral BID   levothyroxine  125 mcg Oral Daily   pantoprazole  40 mg Oral Daily   pregabalin  300 mg Oral BID   promethazine       rosuvastatin  40 mg Oral Daily   Continuous Infusions:  promethazine (PHENERGAN) injection (IM or IVPB) 12.5 mg (12/02/21 1328)   PRN Meds: albuterol, promethazine (PHENERGAN) injection (IM or IVPB), promethazine  Allergies:    Allergies  Allergen Reactions   Ketamine Other (See Comments)    In vegetative state for 15 minutes per pt   Erythromycin    Fentanyl Nausea And Vomiting   Maitake Mushroom [Maitake] Itching    Itchy throat   Penicillin V Itching    Gi upset   Shellfish Allergy Other (See Comments)    Pt has never had shellfish but tested positive on allergy test. Pt states contrast in CT is okay   Morphine Itching and Rash   Penicillins Rash    Has patient had [name] PCN reaction causing immediate rash, facial/tongue/throat swelling, SOB or lightheadedness with hypotension: Y Has patient had [name] PCN reaction causing severe rash involving mucus membranes or skin necrosis: Y Has patient had [name] PCN reaction that required hospitalization: N Has  patient had [name] PCN reaction occurring within the last 10 years: Y If all of the above answers are "NO", then may proceed with Cephalosporin use.    Prednisone Rash    Social History:   Social History   Socioeconomic History   Marital status: Married    Spouse name: Not on file  Number of children: 0   Years of education: Not on file   Highest education level: Not on file  Occupational History   Occupation: [employer]  Tobacco Use   Smoking status: Never   Smokeless tobacco: Never  Vaping Use   Vaping Use: Never used  Substance and Sexual Activity   Alcohol use: Never   Drug use: Never   Sexual activity: Yes  Other Topics Concern   Not on file  Social History Narrative   Not on file   Social Determinants of Health   Financial Resource Strain: Medium Risk (08/19/2021)   Overall Financial Resource Strain (CARDIA)    Difficulty of Paying Living Expenses: Somewhat hard  Food Insecurity: Food Insecurity Present (08/19/2021)   Hunger Vital Sign    Worried About Running Out of Food in the Last Year: Sometimes true    Ran Out of Food in the Last Year: Never true  Transportation Needs: No Transportation Needs (08/19/2021)   PRAPARE - Hydrologist (Medical): No    Lack of Transportation (Non-Medical): No  Physical Activity: Not on file  Stress: Not on file  Social Connections: Not on file  Intimate Partner Violence: Not on file    Family History:    Family History  Problem Relation Age of Onset   Diabetes Mother    Hypertension Mother    Hyperlipidemia Mother    Thyroid disease Mother    Hypertension Father    Diabetes Father    Pancreatic cancer Paternal Aunt    Pancreatic cancer Paternal Uncle    Colon cancer Neg Hx    Esophageal cancer Neg Hx    Stomach cancer Neg Hx      ROS:  Please see the history of present illness.   All other ROS reviewed and negative.     Physical Exam/Data:   Vitals:   12/02/21 1100 12/02/21 1224  12/02/21 1500 12/02/21 1600  BP: (!) 140/81 (!) 132/98 126/79 (!) 152/88  Pulse: (!) 105 (!) 101 (!) 101 96  Resp: 17 18 18 13   Temp:  97.6 F (36.4 C)    TempSrc:  Oral    SpO2: 93% 94% 91% 92%  Weight:      Height:        Intake/Output Summary (Last 24 hours) at 12/02/2021 1714 Last data filed at 12/02/2021 1210 Gross per 24 hour  Intake 1000 ml  Output --  Net 1000 ml      12/01/2021    8:05 PM 11/25/2021   12:03 PM 11/18/2021    3:30 PM  Last 3 Weights  Weight (lbs) 147 lb 147 lb 147 lb  Weight (kg) 66.679 kg 66.679 kg 66.679 kg     Body mass index is 31.81 kg/m.  General:  Well nourished, well developed, in no acute distress HEENT: normal Neck: no JVD Vascular: No carotid bruits; Distal pulses 2+ bilaterally Cardiac:  normal S1, S2; RRR; no murmur  Lungs:  clear to auscultation bilaterally, no wheezing, rhonchi or rales  Abd: obese, soft, +distension Ext: Trace edema, warm Musculoskeletal:  No deformities, BUE and BLE strength normal and equal Skin: warm and dry  Neuro:  CNs 2-12 intact, no focal abnormalities noted Psych:  Normal affect   EKG:  The EKG was personally reviewed and demonstrates:  sinus tachycardia Telemetry:  Telemetry was personally reviewed and demonstrates:  sinus tachycardia/NSR  Relevant CV Studies: TTE 08/15/21: IMPRESSIONS     1. Hypokinesis of the infeirior,  inferoseptal walls . Left ventricular  ejection fraction, by estimation, is 45 to 50%. The left ventricle has  mildly decreased function.   2. Right ventricular systolic function is normal. The right ventricular  size is normal. There is normal pulmonary artery systolic pressure.   3. . The mitral valve is normal in structure. Mild to moderate mitral  valve regurgitation.   4. The aortic valve is normal in structure. Aortic valve regurgitation is  mild.   Comparison(s): The left ventricular function is unchanged.   [place] 08/05/21: Findings:   RA = 12 prominent V waves RV =  47/14 PA = 48/25 (37) PCW = 25 Fick cardiac output/index = 4.4/2.8 PVR =3.7 WU Ao sat = 99% PA sat = 62%, 62%   Assessment: 1. Elevated filling pressures with normal cardiac output 2. Mild to moderate mixed pulmonary HTN with prominent v-waves in RA suggestive of significant TR  RHC/LHC 11/2020: Right and left heart catheterization 12/03/2020: Normal right heart catheterization pressures.  RA 4/1, mean 1; RV 23/4, EDP 6; PA 22/8, mean 11 and PA saturation 76%.  PW 9/7, mean 7 mmHg. CO 5.6, CI 3.88.  QP/QS 1.00. LV: 136/9, EDP 17 mmHg.  Ao 131/86, mean 105 mmHg.  No pressure gradient across the aortic valve. RCA: [name] very small but dominant RCA which is diffusely diseased around 80%.  Mid segment 99% occlusion.  Conus branch collateralizes distal RCA. LM: Large, smooth and normal. LAD: Large vessel, minimal diffuse disease.  Small to moderate-sized D1 and D2. CX: Large vessel.  Minimal diffuse disease.  Gives origin to large OM1 and OM 2.   Impression: Nonischemic cardiomyopathy, single vessel occlusion does not explain her markedly reduced LVEF.  Medical therapy for CAD and nonischemic cardiomyopathy.  I have started her on carvedilol 3.125 mg twice daily.    Laboratory Data:  High Sensitivity Troponin:   Recent Labs  Lab 12/01/21 2011 12/01/21 2221 12/02/21 0520  TROPONINIHS 33* 56* 68*     Chemistry Recent Labs  Lab 12/01/21 2011 12/02/21 1219  NA 133* 138  K 4.5 4.5  CL 98 104  CO2 22 23  GLUCOSE 301* 194*  BUN 54* 48*  CREATININE 2.12* 1.76*  CALCIUM 9.5 8.5*  GFRNONAA 32* 39*  ANIONGAP 13 11    No results for input(s): "PROT", "ALBUMIN", "AST", "ALT", "ALKPHOS", "BILITOT" in the last 168 hours. Lipids No results for input(s): "CHOL", "TRIG", "HDL", "LABVLDL", "LDLCALC", "CHOLHDL" in the last 168 hours.  Hematology Recent Labs  Lab 12/01/21 2011 12/02/21 1219  WBC 6.3 5.6  RBC 3.61* 3.24*  HGB 10.3* 9.1*  HCT 31.1* 28.1*  MCV 86.1 86.7  MCH 28.5 28.1   MCHC 33.1 32.4  RDW 14.9 15.0  PLT 147* 114*   Thyroid No results for input(s): "TSH", "FREET4" in the last 168 hours.  BNP Recent Labs  Lab 12/01/21 2011  BNP 41.3    DDimer No results for input(s): "DDIMER" in the last 168 hours.   Radiology/Studies:  DG Chest 2 View  Result Date: 12/01/2021 CLINICAL DATA:  Chest pain EXAM: CHEST - 2 VIEW COMPARISON:  09/01/2021 FINDINGS: The heart size and mediastinal contours are within normal limits. Both lungs are clear. The visualized skeletal structures are unremarkable. IMPRESSION: No active cardiopulmonary disease. Electronically Signed   By: Donavan Foil M.D.   On: 12/01/2021 20:26     Assessment and Plan:   Chronic Systolic Heart Failure - Likely nonischemic CM as single vessel CTO of RCA did not  fully explain degree of LV dysfunction - Echo (1/22): EF 35-40% Mod MR - Echo (11/28/20): EF 20-25% with moderate RV dysfunction and mild septal flattening. Mod-severe TR - Rheum serologies negative, but Rheumatoid factor mildly elevated at 15.5 - cMRI (9/22): EF 29%c/w prior myocarditis vs sarcoid. May have delayed chemo cardiotoxcity.  - R/LHC with single vessel RCA occlusion, RA 1, LVEDP 17, Fick CO 5.6/CI 3.88. - Echo (10/22): EF 45-50%, grade II DD, RV ok. - Echo (12/22): EF 45%, RV normal  - RHC (5/23) with elevated filling pressures and normal CO, RA 12 (with prominent v-waves), RV 47/14, PA 48/25 (37), PCW 25 - Echo (5/23): EF 45-50%, RV normal  - Chronically NYHA Class II-III. Currently appears mildly volume up after holding diuretics for several days. Cr back to baseline. - Give lasix 76m IV x1 dose and monitor response - Resume Entresto 24-26 mg bid.  - Continue Coreg 3.125 mg bid - Continue Corlanor 7.5 mg bid - Off Jardiance due to frequent GU infections  - Off spiro w/ hyperkalemia  - Will discuss with HF about Cardiomems implantation - HF to see tomorrow  2. Chest Pain - Presented to the ER with sharp chest pain with  trop 33>68 - ECG with no acute ischemic changes - Cath 11/2020 with CTO RCA but no significant disease elsewhere with low suspicion for ACS - Suspect this is related to mild volume overload - Will give lasix 842mIV x1 dose and monitor response   3. Recent AKI - Improved back to baseline with recent holding of diuretics and entresto - Will trend   4. CAD - Single vessel RCA occlusion (small co-dominant vessel) on cardiac cath 09/08. - Medical management. - Continue aspirin + statin   5. Pulmonary hypertension - Mild to moderate on RHC 5/23. PVR 3.7 WU. - Can consider cautious trial of selective pulmonary vasodilators when better diuresed   6. TR - Prominent v-waves in RA tracing on RHC 5/23 - Echo 5/23 shows only mild TR    7. Severe hyperTG - Due to LPL deficiency with recurrent pancreatitis. - Continue fenofibrate + niacin  - [facility]. [facility]   8. Type 3c DM - [facility]- 2/2 chronic pancreatitis    9. Abdominal swelling/bloating - Seen at [facility] (11/20) for IBS, per chart review. - Had penumatosis intestinalis. She was started on pancreatic enzyme replacement - EGD (10/21): normal, bx taken to assess for celiac - CT abdomen (2/22) and RUQ USKoreahowed no obvious gallbladder stones. - HIDA (4/22): ended early due to tachypnea, but cystic duct and gallbladder appeared WNL - CT abdomen 12/22 unremarkable  - suspect primarily fatty liver - Abdominal u/s with no ascites   10. Poor Med Compliance - Continue paramedicine, appreciate their assistance     Risk Assessment/Risk Scores:      New York Heart Association (NYHA) Functional Class NYHA Class III        For questions or updates, please contact [facility] consult [email or web address] for contact info under    Signed, HeFreada Bergeron MD  12/02/2021 5:14 PM

## 2021-12-02 NOTE — Plan of Care (Signed)
31 yo F presented w CP. PMHx including astrocytoma s/p resection in 1997 with residual L-sided weakness, type 3c DM, chronic systolic HF EF 00-41%, severe hypertriglyceridemia due LPL deficiency, chronic pancreatitis, NAFLD.  Pt was scheduled for cardiomems due to frequent episodes of HF exc vs admission to the hospital w AKI due to prerenal causes. On admission HDS. On going CP for several days. EKG - unchanged compared to before. Trop 33->56 On BMP evidence of Aki on CKD. ACS is unlikely Plan Npo Fu bnp, UDS Repeat troponin Transfer to [facility] for further care Repeat TTE.

## 2021-12-02 NOTE — ED Notes (Addendum)
First contact with Patient. Patient laying quietly on gurney watching TV. [facility] Patient updated. Will continue to monitor. Call light within reach.  0845: Patient continues to rest quietly. No acute distress noted.   7510: Attempted to administer AM po medications - Patient started to vomit. MD made aware.   1110: [facility] to [facility]. No acute distress noted upon this RN's departure of Patient.

## 2021-12-02 NOTE — ED Notes (Signed)
Pt had minimal relief with the 3 nitro tablets, pain went from a 9 on the pain scale to a 8.

## 2021-12-02 NOTE — H&P (Addendum)
History and Physical    Patient: Jennifer [name] AXK:[id number] DOB: [birth year] DOA: 12/01/2021 DOS: the patient was seen and examined on 12/02/2021 PCP: Sue Lush, PA-C  Patient coming from: Transfer from [facility]  Chief Complaint:  Chief Complaint  Patient presents with   Chest Pain   HPI: Jennifer [name] is a 31 y.o. female with medical history significant of   astrocytoma s/p resection and chemotherapy, hypertriglyceridemia, nonischemic cardiomyopathy with EF 45%, chronic pancreatitis, hypothyroidism, CKD 3A, insulin-dependent diabetes mellitus, who presents with 3-day history of chest pain.  Patient notes that she was taken off of all of her medications due to hyperkalemia seen on labs from 8/25.  She was supposed to be scheduled to have repeat lab work done, but somehow was not scheduled.  Chest pain has been on the left side and she reports feeling like pressure.  At home she had tried taking nitroglycerin without any relief in symptoms.  She complains of having discomfort with taking deep breath in.  In addition to this patient also complained of some epigastric abdominal pain and nonproductive cough.  Normally patient is not on oxygen at baseline.  Denies having any recent fevers, leg swelling, nausea, vomiting, or diarrhea symptoms.  Upon further questioning patient notes that she has had amenorrhea for at least a year.  Her had started her on estrogen supplement about 2 weeks ago.    On arrival to the department patient was noted to be tachycardic and tachypneic.  She was placed on 2 L of nasal cannula oxygen although not documented to be hypoxic.  Labs since 9/6 significant for hemoglobin 10.3->9.1, sodium 133->138, BUN 54-> 48,  creatinine 2.12->1.76, BNP 41.3, high-sensitivity troponin 33-> 56-> 68.  Chest x-ray noted no acute disease.  Cardiology consulted and recommended patient evaluation.  Patient has been given full dose aspirin, 1 L of normal saline IV fluids, and  Dilaudid IV for pain.  Review of Systems: As mentioned in the history of present illness. All other systems reviewed and are negative. Past Medical History:  Diagnosis Date   Brain tumor ([place]) 03/29/1995   astrocytoma   CHF (congestive heart failure) (HCC)    Cholesterosis    DM (diabetes mellitus) ([place]) 10/10/2018   Fatty liver    HTN (hypertension) 10/10/2018   Hypothyroidism 10/10/2018   Lipoprotein deficiency    Lung disease    longevity long term   Pancreatitis    Polycystic ovary syndrome    Past Surgical History:  Procedure Laterality Date   ABDOMINAL SURGERY     pt states during miscarriage got her intestine   BRAIN SURGERY     EYE MUSCLE SURGERY Right 03/28/2014   RIGHT HEART CATH N/A 08/05/2021   Procedure: RIGHT HEART CATH;  Surgeon: [name], MD;  Location: [facility];  Service: Cardiovascular;  Laterality: N/A;   RIGHT/LEFT HEART CATH AND CORONARY ANGIOGRAPHY N/A 12/03/2020   Procedure: RIGHT/LEFT HEART CATH AND CORONARY ANGIOGRAPHY;  Surgeon: [name], MD;  Location: [facility];  Service: Cardiovascular;  Laterality: N/A;   VENTRICULOSTOMY  03/28/1997   Social History:  reports that she has never smoked. She has never used smokeless tobacco. She reports that she does not drink alcohol and does not use drugs.  Allergies  Allergen Reactions   Ketamine Other (See Comments)    In vegetative state for 15 minutes per pt   Erythromycin    Fentanyl Nausea And Vomiting   Maitake Mushroom [[place]] Itching  Itchy throat   Penicillin V Itching    Gi upset   Shellfish Allergy Other (See Comments)    Pt has never had shellfish but tested positive on allergy test. Pt states contrast in CT is okay   Morphine Itching and Rash   Penicillins Rash    Has patient had a PCN reaction causing immediate rash, facial/tongue/throat swelling, SOB or lightheadedness with hypotension: Y Has patient had a PCN reaction causing severe rash involving mucus  membranes or skin necrosis: Y Has patient had a PCN reaction that required hospitalization: N Has patient had a PCN reaction occurring within the last 10 years: Y If all of the above answers are "NO", then may proceed with Cephalosporin use.    Prednisone Rash    Family History  Problem Relation Age of Onset   Diabetes Mother    Hypertension Mother    Hyperlipidemia Mother    Thyroid disease Mother    Hypertension Father    Diabetes Father    Pancreatic cancer Paternal Aunt    Pancreatic cancer Paternal Uncle    Colon cancer Neg Hx    Esophageal cancer Neg Hx    Stomach cancer Neg Hx     Prior to Admission medications   Medication Sig Start Date End Date Taking? Authorizing Provider  albuterol (VENTOLIN HFA) 108 (90 Base) MCG/ACT inhaler Inhale 1-2 puffs into the lungs every 6 (six) hours as needed for wheezing or shortness of breath. 12/31/18  Yes [provider]  aspirin EC 81 MG EC tablet Take 1 tablet (81 mg total) by mouth daily. Swallow whole. 12/07/20  Yes Nicole Kindred A, DO  carvedilol (COREG) 3.125 MG tablet Take 1 tablet (3.125 mg total) by mouth 2 (two) times daily with a meal. 09/13/21  Yes [place]ford, Maricela Bo, FNP  dicyclomine (BENTYL) 10 MG capsule Take 10 mg by mouth daily.   Yes [provider]  DULoxetine (CYMBALTA) 30 MG capsule Take 30 mg by mouth at bedtime. 01/28/19 12/16/21 Yes [provider]  erythromycin ophthalmic ointment    Yes [provider]  estradiol (ESTRACE) 0.5 MG tablet Take 0.5 mg by mouth daily. 11/17/21  Yes [provider]  Evolocumab (REPATHA) 140 MG/ML SOSY Inject 140 mg into the skin See admin instructions. Inject 140 mg subcutaneously every other Wednesday evening   Yes [provider]  fenofibrate 160 MG tablet Take 1 tablet (160 mg total) by mouth daily. 12/07/20  Yes Nicole Kindred A, DO  insulin regular human CONCENTRATED (HUMULIN R) 500 UNIT/ML injection Inject 100-260 Units into the  skin See admin instructions. 270 units 3 times daily with each meal, 100 units at bedtime per patient   Yes [provider]  ivabradine (CORLANOR) 7.5 MG TABS tablet Take 1 tablet (7.5 mg total) by mouth 2 (two) times daily with a meal. 08/02/21  Yes [name], Shaune Pascal, MD  levothyroxine (SYNTHROID) 125 MCG tablet Take 1 tablet (125 mcg total) by mouth daily at 6 (six) AM. 12/07/20  Yes Nicole Kindred A, DO  metolazone (ZAROXOLYN) 2.5 MG tablet Take 2.5 mg by mouth once a week.   Yes [provider]  ondansetron (ZOFRAN) 4 MG tablet Take 4 mg by mouth every 8 (eight) hours as needed for nausea or vomiting. 12/19/18  Yes [provider]  pantoprazole (PROTONIX) 40 MG tablet Take 40 mg by mouth daily. 12/24/19  Yes [provider]  Potassium Chloride ER 20 MEQ TBCR Take 40 mEq by mouth once a  week. 10/24/21  Yes [provider]  pregabalin (LYRICA) 300 MG capsule Take 300 mg by mouth 2 (two) times daily.   Yes [provider]  prochlorperazine (COMPAZINE) 5 MG tablet Take 5 mg by mouth every 6 (six) hours as needed for nausea or vomiting.   Yes [provider]  rosuvastatin (CRESTOR) 40 MG tablet Take 40 mg by mouth daily. 08/09/21  Yes [provider]  torsemide (DEMADEX) 20 MG tablet Take 60 mg by mouth in the morning and at bedtime. 09/27/21  Yes [provider]  tretinoin (RETIN-A) 0.05 % cream Apply 1 application topically at bedtime. 01/17/21  Yes [provider]  [name] 1/20 1-20 MG-MCG tablet Take 1 tablet by mouth daily. 11/01/21   [provider]  medroxyPROGESTERone (PROVERA) 10 MG tablet Take 10 mg by mouth daily. 11/17/21   [provider]  nitroGLYCERIN (NITROSTAT) 0.4 MG SL tablet Place 1 tablet (0.4 mg total) under the tongue every 5 (five) minutes as needed for up to 25 days for chest pain. Patient not taking: Reported on 11/12/2021 06/16/21 08/03/22  [name], Shaune Pascal, MD  ofloxacin  (OCUFLOX) 0.3 % ophthalmic solution Place 1 drop into both eyes daily as needed (only take after eye injections per patient). 09/02/20   [provider]    Physical Exam: Vitals:   12/02/21 1224 12/02/21 1500 12/02/21 1600 12/02/21 1723  BP: (!) 132/98 126/79 (!) 152/88 128/73  Pulse: (!) 101 (!) 101 96 98  Resp: 18 18 13 12   Temp: 97.6 F (36.4 C)   97.7 F (36.5 C)  TempSrc: Oral   Oral  SpO2: 94% 91% 92%   Weight:      Height:       Exam  Constitutional: Obese young female currently in NAD, calm, comfortable Eyes: Right eyelid lag.    ENMT: Mucous membranes are moist.       .  Neck: normal, supple, no JVD Respiratory: Normal respiratory effort currently on 2 L nasal cannula oxygen without significant wheezes appreciated. Cardiovascular: Regular rate and rhythm, no murmurs / rubs / gallops.  Trace edema on the right lower extremity that is bigger than the left and patient states this is chronic.Marland Kitchen 2+ pedal pulses.   Abdomen: Abdominal distention appreciated,  Bowel sounds positive.  Musculoskeletal: no clubbing / cyanosis. No joint deformity upper and lower extremities. Good ROM, no contractures. Normal muscle tone.  Skin: no rashes, lesions, ulcers.   Neurologic: CN 2-12 grossly intact. Strength 5/5 in all 4.  Psychiatric: Normal judgment and insight. Alert and oriented x 3. Normal mood.   Data Reviewed:  EKG reveals sinus tachycardia 109 bpm with EKG otherwise unchanged from prior.  Reviewed labs imaging and pertinent records as noted above in HPI.  Assessment and Plan: Chest pain elevated troponin Patient presented with complaints of left-sided chest pain which she makes it seem may be pleuritic in nature.  EKG without significant ischemic changes. High-sensitivity troponin 33-> 56-> 68.  Chest x-ray did not note any acute abnormality.  Patient recently started on estrogen supplements in the last 2 weeks and is at risk for pulmonary embolism.  Patient's last noted  single-vessel RCA occlusion from last cardiac cath from 11/2020. -Admit to a telemetry bed -Check echocardiogram -Check VQ scan to rule out pulmonary embolus.  Starting heparin drip noted to be positive -Continue aspirin and statin -Follow-up telemetry overnight -Cardiology was consulted, follow-up for any further recommendations  Heart failure reduced EF, Neri artery hypertension  Chronic.  Patient does not appear grossly fluid overloaded.  BNP 41.3.  She had received 1 L of IV fluids while in the ED.  Last echocardiogram from 07/2021 noted EF of 45-50% with hypokinesis of the inferior, inferoseptal walls.  Right heart cath noted mild to moderate PAH on 07/2021. -Strict I&Os and daily weights -Continue beta-blocker, Corlanor -Entresto was restarted and patient ordered Lasix 80 mg IV x1 dose per cardiology  Acute kidney injury superimposed chronic kidney disease stage IIIb Resolvied.  Creatinine improved from 2.12 with a BUN 54 down to 1.76 with BUN 48 today after receiving 1 L of IV fluids which appears more so around patient's prior baseline.  Abdominal pain history of chronic pancreatitis Patient reports having epigastric abdominal pain.  She thinks it is related to chronic pancreatitis. -Follow-up triglyceride levels and add-on lipase  Essential hypertension Blood pressures currently stable. -Continue medication regimen  Pancytopenia Chronic.  Hemoglobin 9.1 which appears around priors, but platelet count is 114 which is slightly lower than previous baseline.  No reports of bleeding. -Continue to monitor  Hypertriglyceridemia Home medication regimen includes Repatha injections, Crestor 40 mg daily and fenofibrate 160 mg daily. -Follow-up triglyceride level -Continue current regimen  Uncontrolled insulin-dependent diabetes mellitus Patient's initial blood sugar elevated at 301.  Last hemoglobin A1c 7.9 on 08/15/2021.  -Hypoglycemic protocols -Hold home insulin regimen -CBGs  every 4 hours with resistant SSI -Appreciate diabetic education  Amenorrhea Patient reports that she has not had a menstrual cycle in over a year and was recently started on estradiol 0.5 mg daily 2 weeks ago. -Hold estradiol  History of astrocytoma s/p resection in 1997   DVT prophylaxis Advance Care Planning:   Code Status: Prior    Consults: Cardiology  Family Communication: Husband updated at bedside  Severity of Illness: The appropriate patient status for this patient is OBSERVATION. Observation status is judged to be reasonable and necessary in order to provide the required intensity of service to ensure the patient's safety. The patient's presenting symptoms, physical exam findings, and initial radiographic and laboratory data in the context of their medical condition is felt to place them at decreased risk for further clinical deterioration. Furthermore, it is anticipated that the patient will be medically stable for discharge from the hospital within 2 midnights of admission.   Author: Norval Morton, MD 12/02/2021 5:28 PM  For on call review [email or web address].

## 2021-12-02 NOTE — Progress Notes (Addendum)
Inpatient Diabetes Program Recommendations  AACE/ADA: New Consensus Statement on Inpatient Glycemic Control (2015)  Target Ranges:  Prepandial:   less than 140 mg/dL      Peak postprandial:   less than 180 mg/dL (1-2 hours)      Critically ill patients:  140 - 180 mg/dL   Lab Results  Component Value Date   GLUCAP 172 (H) 12/02/2021   HGBA1C 7.9 (H) 08/15/2021    Review of Glycemic Control  Latest Reference Range & Units 12/02/21 09:36  Glucose-Capillary 70 - 99 mg/dL 172 (H)   Diabetes history: DM Type 3C Outpatient Diabetes medications:  U500 270 units tid with meals and U500 100 units q PM Jardiance 10 mg daily Current orders for Inpatient glycemic control:  None  Inpatient Diabetes Program Recommendations:   Note patient is on very high amount of insulin at home totaling close to 900 units daily. She see's endocrinologist at [facility].  Recommend adding Novolog resistant q 4 hours.  May need to check lipase, amylase, and triglycerides as well?   Thanks,  Adah Perl, RN, BC-ADM Inpatient Diabetes Coordinator Pager [phone]  (8a-5p)

## 2021-12-03 ENCOUNTER — Observation Stay (HOSPITAL_COMMUNITY): Payer: BC Managed Care – PPO

## 2021-12-03 DIAGNOSIS — R079 Chest pain, unspecified: Secondary | ICD-10-CM

## 2021-12-03 DIAGNOSIS — E871 Hypo-osmolality and hyponatremia: Secondary | ICD-10-CM | POA: Diagnosis present

## 2021-12-03 DIAGNOSIS — I2721 Secondary pulmonary arterial hypertension: Secondary | ICD-10-CM | POA: Diagnosis present

## 2021-12-03 DIAGNOSIS — E875 Hyperkalemia: Secondary | ICD-10-CM | POA: Diagnosis present

## 2021-12-03 DIAGNOSIS — I5023 Acute on chronic systolic (congestive) heart failure: Secondary | ICD-10-CM

## 2021-12-03 DIAGNOSIS — G8929 Other chronic pain: Secondary | ICD-10-CM | POA: Diagnosis present

## 2021-12-03 DIAGNOSIS — N184 Chronic kidney disease, stage 4 (severe): Secondary | ICD-10-CM | POA: Diagnosis present

## 2021-12-03 DIAGNOSIS — E662 Morbid (severe) obesity with alveolar hypoventilation: Secondary | ICD-10-CM | POA: Diagnosis present

## 2021-12-03 DIAGNOSIS — E1122 Type 2 diabetes mellitus with diabetic chronic kidney disease: Secondary | ICD-10-CM | POA: Diagnosis present

## 2021-12-03 DIAGNOSIS — E1165 Type 2 diabetes mellitus with hyperglycemia: Secondary | ICD-10-CM | POA: Diagnosis present

## 2021-12-03 DIAGNOSIS — R778 Other specified abnormalities of plasma proteins: Secondary | ICD-10-CM | POA: Diagnosis present

## 2021-12-03 DIAGNOSIS — Z794 Long term (current) use of insulin: Secondary | ICD-10-CM | POA: Diagnosis not present

## 2021-12-03 DIAGNOSIS — K760 Fatty (change of) liver, not elsewhere classified: Secondary | ICD-10-CM | POA: Diagnosis present

## 2021-12-03 DIAGNOSIS — D61818 Other pancytopenia: Secondary | ICD-10-CM | POA: Diagnosis present

## 2021-12-03 DIAGNOSIS — I10 Essential (primary) hypertension: Secondary | ICD-10-CM | POA: Diagnosis not present

## 2021-12-03 DIAGNOSIS — I130 Hypertensive heart and chronic kidney disease with heart failure and stage 1 through stage 4 chronic kidney disease, or unspecified chronic kidney disease: Secondary | ICD-10-CM | POA: Diagnosis present

## 2021-12-03 DIAGNOSIS — E785 Hyperlipidemia, unspecified: Secondary | ICD-10-CM | POA: Diagnosis present

## 2021-12-03 DIAGNOSIS — E039 Hypothyroidism, unspecified: Secondary | ICD-10-CM | POA: Diagnosis present

## 2021-12-03 DIAGNOSIS — D631 Anemia in chronic kidney disease: Secondary | ICD-10-CM | POA: Diagnosis present

## 2021-12-03 DIAGNOSIS — E1169 Type 2 diabetes mellitus with other specified complication: Secondary | ICD-10-CM

## 2021-12-03 DIAGNOSIS — Z79899 Other long term (current) drug therapy: Secondary | ICD-10-CM | POA: Diagnosis not present

## 2021-12-03 DIAGNOSIS — I5043 Acute on chronic combined systolic (congestive) and diastolic (congestive) heart failure: Secondary | ICD-10-CM | POA: Diagnosis present

## 2021-12-03 DIAGNOSIS — N189 Chronic kidney disease, unspecified: Secondary | ICD-10-CM | POA: Diagnosis not present

## 2021-12-03 DIAGNOSIS — N179 Acute kidney failure, unspecified: Secondary | ICD-10-CM | POA: Diagnosis not present

## 2021-12-03 DIAGNOSIS — E876 Hypokalemia: Secondary | ICD-10-CM | POA: Diagnosis present

## 2021-12-03 DIAGNOSIS — E11649 Type 2 diabetes mellitus with hypoglycemia without coma: Secondary | ICD-10-CM | POA: Diagnosis present

## 2021-12-03 DIAGNOSIS — I428 Other cardiomyopathies: Secondary | ICD-10-CM | POA: Diagnosis present

## 2021-12-03 DIAGNOSIS — N170 Acute kidney failure with tubular necrosis: Secondary | ICD-10-CM | POA: Diagnosis present

## 2021-12-03 DIAGNOSIS — K861 Other chronic pancreatitis: Secondary | ICD-10-CM | POA: Diagnosis present

## 2021-12-03 DIAGNOSIS — I509 Heart failure, unspecified: Secondary | ICD-10-CM | POA: Insufficient documentation

## 2021-12-03 DIAGNOSIS — I5082 Biventricular heart failure: Secondary | ICD-10-CM | POA: Diagnosis present

## 2021-12-03 LAB — ECHOCARDIOGRAM COMPLETE
AR max vel: 1.84 cm2
AV Area VTI: 1.92 cm2
AV Area mean vel: 1.89 cm2
AV Mean grad: 4 mmHg
AV Peak grad: 7.3 mmHg
Ao pk vel: 1.35 m/s
Height: 57 in
S' Lateral: 3.4 cm
Weight: 2352 oz

## 2021-12-03 LAB — GLUCOSE, CAPILLARY
Glucose-Capillary: 234 mg/dL — ABNORMAL HIGH (ref 70–99)
Glucose-Capillary: 239 mg/dL — ABNORMAL HIGH (ref 70–99)
Glucose-Capillary: 343 mg/dL — ABNORMAL HIGH (ref 70–99)
Glucose-Capillary: 331 mg/dL — ABNORMAL HIGH (ref 70–99)
Glucose-Capillary: 340 mg/dL — ABNORMAL HIGH (ref 70–99)

## 2021-12-03 LAB — BASIC METABOLIC PANEL
Anion gap: 11 (ref 5–15)
BUN: 50 mg/dL — ABNORMAL HIGH (ref 6–20)
CO2: 20 mmol/L — ABNORMAL LOW (ref 22–32)
Calcium: 9.1 mg/dL (ref 8.9–10.3)
Chloride: 103 mmol/L (ref 98–111)
Creatinine, Ser: 2.45 mg/dL — ABNORMAL HIGH (ref 0.44–1.00)
GFR, Estimated: 27 mL/min — ABNORMAL LOW (ref >60)
Glucose, Bld: 259 mg/dL — ABNORMAL HIGH (ref 70–99)
Potassium: 4.9 mmol/L (ref 3.5–5.1)
Sodium: 134 mmol/L — ABNORMAL LOW (ref 135–145)

## 2021-12-03 MED ORDER — INSULIN ASPART 100 UNIT/ML IJ SOLN
4.0000 [IU] | Freq: Three times a day (TID) | INTRAMUSCULAR | Status: DC
  Administered 2021-12-04 (×2): 4 [IU] via SUBCUTANEOUS

## 2021-12-03 MED ORDER — TORSEMIDE 20 MG PO TABS
40.0000 mg | ORAL_TABLET | Freq: Two times a day (BID) | ORAL | Status: DC
  Administered 2021-12-03: 40 mg via ORAL
  Filled 2021-12-03: qty 2

## 2021-12-03 MED ORDER — FUROSEMIDE 10 MG/ML IJ SOLN
80.0000 mg | Freq: Once | INTRAMUSCULAR | Status: DC
  Administered 2021-12-03: 80 mg via INTRAVENOUS
  Filled 2021-12-03: qty 8

## 2021-12-03 MED ORDER — INSULIN ASPART 100 UNIT/ML IJ SOLN
0.0000 [IU] | Freq: Three times a day (TID) | INTRAMUSCULAR | Status: DC
  Administered 2021-12-04 – 2021-12-05 (×4): 20 [IU] via SUBCUTANEOUS
  Administered 2021-12-05: 11 [IU] via SUBCUTANEOUS
  Administered 2021-12-06: 20 [IU] via SUBCUTANEOUS
  Administered 2021-12-06: 11 [IU] via SUBCUTANEOUS
  Administered 2021-12-07 (×2): 20 [IU] via SUBCUTANEOUS

## 2021-12-03 MED ORDER — DICYCLOMINE HCL 10 MG PO CAPS
10.0000 mg | ORAL_CAPSULE | Freq: Three times a day (TID) | ORAL | Status: DC | PRN
  Administered 2021-12-03 – 2021-12-10 (×5): 10 mg via ORAL
  Filled 2021-12-03 (×5): qty 1

## 2021-12-03 MED ORDER — INSULIN ASPART 100 UNIT/ML IJ SOLN
0.0000 [IU] | Freq: Every day | INTRAMUSCULAR | Status: DC
  Administered 2021-12-03: 4 [IU] via SUBCUTANEOUS
  Administered 2021-12-04 – 2021-12-05 (×2): 5 [IU] via SUBCUTANEOUS
  Administered 2021-12-06: 2 [IU] via SUBCUTANEOUS

## 2021-12-03 MED ORDER — INSULIN GLARGINE-YFGN 100 UNIT/ML ~~LOC~~ SOLN
30.0000 [IU] | Freq: Every day | SUBCUTANEOUS | Status: DC
Start: 2021-12-03 — End: 2021-12-04
  Administered 2021-12-03: 30 [IU] via SUBCUTANEOUS
  Filled 2021-12-03 (×2): qty 0.3

## 2021-12-03 MED ORDER — TECHNETIUM TO 99M ALBUMIN AGGREGATED
4.0000 | Freq: Once | INTRAVENOUS | Status: AC | PRN
Start: 2021-12-03 — End: 2021-12-03
  Administered 2021-12-03: 4 via INTRAVENOUS

## 2021-12-03 NOTE — Plan of Care (Signed)

## 2021-12-03 NOTE — Inpatient Diabetes Management (Addendum)
Inpatient Diabetes Program Recommendations  AACE/ADA: New Consensus Statement on Inpatient Glycemic Control (2015)  Target Ranges:  Prepandial:   less than 140 mg/dL      Peak postprandial:   less than 180 mg/dL (1-2 hours)      Critically ill patients:  140 - 180 mg/dL   Lab Results  Component Value Date   GLUCAP 343 (H) 12/03/2021   HGBA1C 7.9 (H) 08/15/2021    Latest Reference Range & Units 12/02/21 09:36 12/02/21 21:01 12/02/21 23:31 12/03/21 04:28 12/03/21 08:23 12/03/21 12:47  Glucose-Capillary 70 - 99 mg/dL 172 (H) 357 (H) 272 (H) 234 (H) 239 (H) 343 (H)  (H): Data is abnormally high Review of Glycemic Control  Diabetes history: type 1 Outpatient Diabetes medications: U-500 insulin 270 units TID, 100 units at HS Current orders for Inpatient glycemic control: Novolog 0-20 units TID every 4 hours  Inpatient Diabetes Program Recommendations:   Spoke with patient at the bedside. Patient was diagnosed with diabetes about 15 years ago.  Patient has been taking U-500 insulin at home, but states that her blood sugars tend to stay in the 200's-300's at home. She has always had issues with her blood sugars. [name] very resistant. She has been able to eat at least 50% of meal at lunch. Has been on Toujeo in the past.  Recommend starting Semglee 30 units at HS, continue Novolog 0-20 units correction scale TID, add Novolog 0-5 units HS scale, and Novolog 4 units TID with meals if eating at least 50% of meal. Titrate dosages as needed.   Will continue to monitor blood sugars while in the hospital.  Harvel Ricks RN BSN CDE Diabetes Coordinator Pager: [phone]  8am-5pm

## 2021-12-03 NOTE — Progress Notes (Signed)
Mobility Specialist - Progress Note   12/03/21 1436  Mobility  Activity Ambulated with assistance in hallway  Level of Assistance Standby assist, set-up cues, supervision of patient - no hands on  Assistive Device None  Distance Ambulated (ft) 500 ft  Activity Response Tolerated well  $Mobility charge 1 Mobility    During mobility:107 HR  Pt was received in bed and agreeable to mobility. Pt required x1 standing rest break d/t back pain. Pt was returned back to bed with all needs met.   Larey Seat

## 2021-12-03 NOTE — Assessment & Plan Note (Signed)
Hyperglycemia.  Fasting glucose this am 467.  Capillary has been high 448, 499 and 443. Patient with very high insulin resistance.   Plan to increase basal insulin from 50 to 60 units, will divide in 2 doses 30 bid.  Continue insulin sliding scale for glucose cover and monitoring Pre meal insulin 20 units.   Continue with statin therapy.

## 2021-12-03 NOTE — Progress Notes (Signed)
Progress Note   Patient: Jennifer [name] UVO:[id number] DOB: [birth year] DOA: 12/01/2021     0 DOS: the patient was seen and examined on 12/03/2021   Brief hospital course: Jennifer [name] was admitted to the hospital with the working diagnosis of chest pain.   31 yo female with the past medical history of astrocytoma sp resection and chemotherapy, heart failure, chronic kidney disease and T2DM who presented with 3 days of chest pain. Pressure like on her left precordium. Worse with deep inspiration. Positive epigastric abdominal pain. At home patient was off medications due to hypokalemia, she had sodium zirconium prescribed for hyper K. On her initial physical examination her blood pressure was 132/98, HR 101, RR 18 and 02 saturation 94%, lungs with no wheezing or rales, heart with S1 and S2 present and rhythmic, abdomen with no distention, trace lower extremity edema.   Na 132, K 4,5, Cl 98, bicarbonate 22, glucose 301 bun 54 cr 2.12 High sensitive troponin 33 and 56  Wbc 6,3 hgb 10.3 plt 147   Chest radiograph with no cardiomegaly and no infiltrates, no effusions  EKG 109 bpm, normal axis, qtc 504, sinus rhythm, J point elevation V2 to V3, with no significant ST segment or T wave changes.   Patient was placed on furosemide with improvement in her symptoms.   Assessment and Plan: * Acute on chronic systolic CHF (congestive heart failure) (HCC) Echocardiogram with mild reduction in LV systolic function with EF 45 to 50%. Mild to moderate MR,. New echocardiogram report is pending.   Documented urine output is 7425 ml Systolic blood pressure is 99 to 120 mmHg Her symptoms have improved but no back to baseline.  Plan to repeat dose of furosemide 80 mg IV x1 Continue heart failure management with carvedilol and entresto Not on SGLT 2 inh due to recurrent urinary tract infection.   Patient getting a pulmonary V/Q scan for further workup for chest pain and dyspnea.  08/2021 [place] lower  extremities was negative for DVT.   Acute kidney injury superimposed on chronic kidney disease (HCC) CKD stage 3b, hyponatremia.  Renal function with serum cr at 1,76, K is 4,5 and serum bicarbonate at 23. Plan to continue diuresis with furosemide and follow up renal function in am. Jennifer [name] has been resumed, close follow up on K, patient did required sodium zirconium as outpatient.   Essential hypertension Continue blood pressure control with Entresto and carvedilol Continue diuresis with furosemide.   Type 2 diabetes mellitus with hyperlipidemia (HCC) Fasting glucose is 194 Continue insulin sliding scale for glucose cover and monitoring  Continue with statin therapy.   Pancytopenia (HCC) Anemia of chronic disease. Follow up hgb is 9,1, with plt 114, wbc is 5,6 Plan to follow up as outpatient.   History of astrocytoma of brain Plan to follow up as outpatient.         Subjective: Patient with improvement in her symptoms but not back to her baseline. Continue to have chest pain but improved in intensity   Physical Exam: Vitals:   12/02/21 2049 12/03/21 0034 12/03/21 0435 12/03/21 0825  BP: 127/60 136/72 120/65 99/67  Pulse: (!) 111 (!) 101 92 95  Resp: 16 16 16 16   Temp: 97.7 F (36.5 C) 98.2 F (36.8 C) 98.2 F (36.8 C) 98.2 F (36.8 C)  TempSrc: Oral Oral Oral Oral  SpO2: 94%  96%   Weight:      Height:       Neurology awake and alert ENT with mild  pallor Cardiovascular with S1 and S2 present with no gallops or murmurs No JVD Trace lower extremity edema more right than left Respiratory with mild rales at bases with no wheezing Abdomen not distended  Data Reviewed:    Family Communication: no family at the bedside   Disposition: Status is: Observation The patient remains OBS appropriate and will d/c before 2 midnights.  Planned Discharge Destination: Home     Author: Tawni Millers, MD 12/03/2021 11:18 AM  For on call review  [email or web address].

## 2021-12-03 NOTE — Progress Notes (Signed)
   Will set up for RHC with [facility] on Monday at 7:30   Orders placed.   [facility] rep called and will staff case.    Tova Vater NP-C  12:02 PM

## 2021-12-03 NOTE — Consult Note (Addendum)
Advanced Heart Failure Team Consult Note   Primary Physician: Sue Lush, PA-C PCP-Cardiologist:  None  Reason for Consultation: Chest pain, acute on chronic CHF  HPI:    Jennifer [name] is seen today for evaluation of chest pain and acute on chronic CHF at the request of Dr. Johney Frame with [facility].  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  Admitted 11/22/20 with ab pain and diarrhea. Ab CT concerning for vasculitis (no pancreatitis). Her TG were found to be 4030. She was started on an insulin/dextrose infusion. Cardiology and Nephrology were consulted after her creatinine and LFTs continued to rise and she began developing hypotension. Echo 11/28/20 EF 20-25%, moderate RV dysfunction. Started on DBA 2.5. AHF consulted for further management. Milrinone added to augment diuresis. cMRI c/w prior myocarditis vs sarcoid (see below). LVEF 29%. RV normal. R/LHC showing single vessel RCA occlusion, RA 1, LVEDP 17, Fick CO 5.6/CI 3.88.    Echo (10/22): EF up to 45-50%, grade II DD, RV ok. LifeVest off.   Treated in ED 10/22 for pancreatitis, then admitted 11/22 for hyperglycemia. Hospitalization c/b hyponatremia, sodium 118. Concern for volume overload. Lasix restarted but SCr increased. Nephrology consulted and restarted gentle IVF and GDMT held for a couple of days.    Echo 12/22 EF 45%, RV normal.    RHC 5/23  RA = 12 prominent V waves RV = 47/14 PA = 48/25 (37) PCW = 25 Fick cardiac output/index = 4.4/2.8 PVR =3.7 WU Ao sat = 99% PA sat = 62%, 62%   Admitted 5/23 with AKI and hyperkalemia. Given IVF, insulin, calcium gluconate and Lokelma. Echo showed EF 45-50%, RV normal, mild-mod MR, RVSP 30. Only mild TR. Readmitted a week later with hyperglycemia, severe hypertriglyceridemia and  AKI.   She has been followed by Paramedicine in the community. Has been seen in HF clinic several times over the last few months. Multiple discrepancies with medications noted at visits and misses meds at times. Several AKIs after adjusting diuretics (including self adjustments). Has used furoscix, metolazone and torsemide without perceived improvement in HF symptoms.  Had been working on [employer] placement to assist with managing volume status.  All diuretics and entresto stopped after labs on 08/25. Scr up to 3.22 and K 5.5. Prescribed lokelma. Did not end up receiving lokelma and repeat labs not done.   Patient scheduled for f/u in HF clinic yesterday but developed worsening shortness of breath and CP and presented to [facility] ED on evening of 09/06. HS troponin 33>56>68. Scr 2.12>1.76, amylase 12, lipase 28, triglycerides 150. ECG with sinus tachycardia. Received 1L IV fluids. Transferred to [facility] for further workup. She was seen by [facility]. Felt to be mildly volume overloaded after holding diuretics and given 80 mg lasix IV X 1. CP not felt to be d/t angina, more likely 2/2 volume overload.  Reports good UOP with IV lasix. Continues to have intermittent left upper chest discomfort brought on by inspiration. Not worse with ambulation. Did get short of breath ambulating this am. Has some abdominal distension which she reports is wear she tends to hold fluid. Has chronic swelling right leg > left, no significant change.    Review of Systems: [y] = yes, [ ]  = no   General: Weight gain [ ] ; Weight loss [ ] ; Anorexia [ ] ;  Fatigue [ ] ; Fever [ ] ; Chills [ ] ; Weakness [ ]   Cardiac: Chest pain/pressure [Y]; Resting SOB [ ] ; Exertional SOB [Y]; Orthopnea [ ] ; Pedal Edema [ ] ; Palpitations [ ] ; Syncope [ ] ; Presyncope [ ] ; Paroxysmal nocturnal dyspnea[ ]   Pulmonary: Cough [ ] ; Wheezing[ ] ; Hemoptysis[ ] ; Sputum [ ] ; Snoring [ ]   GI: Vomiting[ ] ; Dysphagia[ ] ; Melena[ ] ;  Hematochezia [ ] ; Heartburn[ ] ; Abdominal pain [Y]; Constipation [ ] ; Diarrhea [ ] ; BRBPR [ ]   GU: Hematuria[ ] ; Dysuria [ ] ; Nocturia[ ]   Vascular: Pain in legs with walking [ ] ; Pain in feet with lying flat [ ] ; Non-healing sores [ ] ; Stroke [ ] ; TIA [ ] ; Slurred speech [ ] ;  Neuro: Headaches[ ] ; Vertigo[ ] ; Seizures[ ] ; Paresthesias[ ] ;Blurred vision [ ] ; Diplopia [ ] ; Vision changes [ ]   Ortho/Skin: Arthritis [ ] ; Joint pain [ ] ; Muscle pain [ ] ; Joint swelling [ ] ; Back Pain [ ] ; Rash [ ]   Psych: Depression[ ] ; Anxiety[ ]   Heme: Bleeding problems [ ] ; Clotting disorders [ ] ; Anemia [ ]   Endocrine: Diabetes [Y]; Thyroid dysfunction[ ]   Home Medications Prior to Admission medications   Medication Sig Start Date End Date Taking? Authorizing Provider  albuterol (VENTOLIN HFA) 108 (90 Base) MCG/ACT inhaler Inhale 1-2 puffs into the lungs every 6 (six) hours as needed for wheezing or shortness of breath. 12/31/18  Yes [provider]  aspirin EC 81 MG EC tablet Take 1 tablet (81 mg total) by mouth daily. Swallow whole. 12/07/20  Yes Nicole Kindred A, DO  carvedilol (COREG) 3.125 MG tablet Take 1 tablet (3.125 mg total) by mouth 2 (two) times daily with a meal. 09/13/21  Yes Milford, Maricela Bo, FNP  dicyclomine (BENTYL) 10 MG capsule Take 10 mg by mouth daily.   Yes [provider]  DULoxetine (CYMBALTA) 30 MG capsule Take 30 mg by mouth at bedtime. 01/28/19 12/16/21 Yes [provider]  erythromycin ophthalmic ointment    Yes [provider]  estradiol (ESTRACE) 0.5 MG tablet Take 0.5 mg by mouth daily. 11/17/21  Yes [provider]  Evolocumab (REPATHA) 140 MG/ML SOSY Inject 140 mg into the skin See admin instructions. Inject 140 mg subcutaneously every other Wednesday evening   Yes [provider]  fenofibrate 160 MG tablet Take 1 tablet (160 mg total) by mouth daily. 12/07/20  Yes Nicole Kindred A, DO  insulin regular human CONCENTRATED (HUMULIN  R) 500 UNIT/ML injection Inject 100-260 Units into the skin See admin instructions. 270 units 3 times daily with each meal, 100 units at bedtime per patient   Yes [provider]  ivabradine (CORLANOR) 7.5 MG TABS tablet Take 1 tablet (7.5 mg total) by mouth 2 (two) times daily with a meal. 08/02/21  Yes [name], Shaune Pascal, MD  levothyroxine (SYNTHROID) 125 MCG tablet Take 1 tablet (125 mcg total) by mouth daily at 6 (six) AM. 12/07/20  Yes Nicole Kindred A, DO  metolazone (ZAROXOLYN) 2.5 MG tablet Take 2.5 mg by mouth once a week.   Yes [provider]  ondansetron (ZOFRAN) 4 MG tablet Take 4 mg by mouth every 8 (eight) hours as needed for nausea or vomiting. 12/19/18  Yes [provider]  pantoprazole (PROTONIX) 40 MG tablet Take 40 mg by mouth daily. 12/24/19  Yes [provider]  Potassium Chloride ER 20 MEQ TBCR Take 40 mEq by mouth once a week. 10/24/21  Yes [provider]  pregabalin (LYRICA) 300  MG capsule Take 300 mg by mouth 2 (two) times daily.   Yes [provider]  prochlorperazine (COMPAZINE) 5 MG tablet Take 5 mg by mouth every 6 (six) hours as needed for nausea or vomiting.   Yes [provider]  rosuvastatin (CRESTOR) 40 MG tablet Take 40 mg by mouth daily. 08/09/21  Yes [provider]  torsemide (DEMADEX) 20 MG tablet Take 60 mg by mouth in the morning and at bedtime. 09/27/21  Yes [provider]  tretinoin (RETIN-A) 0.05 % cream Apply 1 application topically at bedtime. 01/17/21  Yes [provider]  [name] 1/20 1-20 MG-MCG tablet Take 1 tablet by mouth daily. 11/01/21   [provider]  medroxyPROGESTERone (PROVERA) 10 MG tablet Take 10 mg by mouth daily. 11/17/21   [provider]  nitroGLYCERIN (NITROSTAT) 0.4 MG SL tablet Place 1 tablet (0.4 mg total) under the tongue every 5 (five) minutes as needed for up to 25 days for chest pain. Patient not taking: Reported on 11/12/2021  06/16/21 08/03/22  [name], Shaune Pascal, MD  ofloxacin (OCUFLOX) 0.3 % ophthalmic solution Place 1 drop into both eyes daily as needed (only take after eye injections per patient). 09/02/20   [provider]    Past Medical History: Past Medical History:  Diagnosis Date   Brain tumor ([place]) 03/29/1995   astrocytoma   CHF (congestive heart failure) (HCC)    Cholesterosis    DM (diabetes mellitus) ([place]) 10/10/2018   Fatty liver    HTN (hypertension) 10/10/2018   Hypothyroidism 10/10/2018   Lipoprotein deficiency    Lung disease    longevity long term   Pancreatitis    Polycystic ovary syndrome     Past Surgical History: Past Surgical History:  Procedure Laterality Date   ABDOMINAL SURGERY     pt states during miscarriage got her intestine   BRAIN SURGERY     EYE MUSCLE SURGERY Right 03/28/2014   RIGHT HEART CATH N/A 08/05/2021   Procedure: RIGHT HEART CATH;  Surgeon: [name], MD;  Location: [place] [facility];  Service: Cardiovascular;  Laterality: N/A;   RIGHT/LEFT HEART CATH AND CORONARY ANGIOGRAPHY N/A 12/03/2020   Procedure: RIGHT/LEFT HEART CATH AND CORONARY ANGIOGRAPHY;  Surgeon: [name], MD;  Location: [place] [facility];  Service: Cardiovascular;  Laterality: N/A;   VENTRICULOSTOMY  03/28/1997    Family History: Family History  Problem Relation Age of Onset   Diabetes Mother    Hypertension Mother    Hyperlipidemia Mother    Thyroid disease Mother    Hypertension Father    Diabetes Father    Pancreatic cancer Paternal Aunt    Pancreatic cancer Paternal Uncle    Colon cancer Neg Hx    Esophageal cancer Neg Hx    Stomach cancer Neg Hx     Social History: Social History   Socioeconomic History   Marital status: Married    Spouse name: Not on file   Number of children: 0   Years of education: Not on file   Highest education level: Not on file  Occupational History   Occupation: [employer]  Tobacco Use   Smoking status: Never    Smokeless tobacco: Never  Vaping Use   Vaping Use: Never used  Substance and Sexual Activity   Alcohol use: Never   Drug use: Never   Sexual activity: Yes  Other Topics Concern   Not on file  Social History Narrative   Not on file  Social Determinants of Health   Financial Resource Strain: Medium Risk (08/19/2021)   Overall Financial Resource Strain (CARDIA)    Difficulty of Paying Living Expenses: Somewhat hard  Food Insecurity: No Food Insecurity (12/03/2021)   Hunger Vital Sign    Worried About Running Out of Food in the Last Year: Never true    Ran Out of Food in the Last Year: Never true  Transportation Needs: No Transportation Needs (12/03/2021)   PRAPARE - Hydrologist (Medical): No    Lack of Transportation (Non-Medical): No  Physical Activity: Not on file  Stress: Not on file  Social Connections: Not on file    Allergies:  Allergies  Allergen Reactions   Ketamine Other (See Comments)    In vegetative state for 15 minutes per pt   Erythromycin    Fentanyl Nausea And Vomiting   Maitake Mushroom [[place]] Itching    Itchy throat   Penicillin V Itching    Gi upset   Shellfish Allergy Other (See Comments)    Pt has never had shellfish but tested positive on allergy test. Pt states contrast in CT is okay   Morphine Itching and Rash   Penicillins Rash    Has patient had a PCN reaction causing immediate rash, facial/tongue/throat swelling, SOB or lightheadedness with hypotension: Y Has patient had a PCN reaction causing severe rash involving mucus membranes or skin necrosis: Y Has patient had a PCN reaction that required hospitalization: N Has patient had a PCN reaction occurring within the last 10 years: Y If all of the above answers are "NO", then may proceed with Cephalosporin use.    Prednisone Rash    Objective:    Vital Signs:   Temp:  [97.6 F (36.4 C)-98.2 F (36.8 C)] 98.2 F (36.8 C) (09/08 0825) Pulse Rate:  [92-111] 95  (09/08 0825) Resp:  [12-18] 16 (09/08 0825) BP: (99-152)/(60-98) 99/67 (09/08 0825) SpO2:  [91 %-96 %] 96 % (09/08 0435) Last BM Date : 12/02/21  Weight change: Filed Weights   12/01/21 2005  Weight: 66.7 kg    Intake/Output:   Intake/Output Summary (Last 24 hours) at 12/03/2021 0921 Last data filed at 12/03/2021 0657 Gross per 24 hour  Intake 1200 ml  Output 1200 ml  Net 0 ml      Physical Exam    General:  Well appearing. Sitting up in chair HEENT: Ptosis right eye Neck: supple. JVP ~ 8 cm. Carotids 2+ bilat; no bruits.  Cor: PMI nondisplaced. Regular rate & rhythm. No rubs, gallops or murmurs. Lungs: clear Abdomen: soft, nontender, + mildly distended. Good bowel sounds. Extremities: no cyanosis, clubbing, rash, edema Neuro: alert & orientedx3, cranial nerves grossly intact. moves all 4 extremities w/o difficulty. Affect pleasant   Telemetry   Sinus/sinus tach 90s-100s  EKG    Sinus tachycardia 109 bpm  Labs   Basic Metabolic Panel: Recent Labs  Lab 12/01/21 2011 12/02/21 1219  NA 133* 138  K 4.5 4.5  CL 98 104  CO2 22 23  GLUCOSE 301* 194*  BUN 54* 48*  CREATININE 2.12* 1.76*  CALCIUM 9.5 8.5*    Liver Function Tests: No results for input(s): "AST", "ALT", "ALKPHOS", "BILITOT", "PROT", "ALBUMIN" in the last 168 hours. Recent Labs  Lab 12/02/21 1219 12/02/21 1902  LIPASE  --  28  AMYLASE 12*  --    No results for input(s): "AMMONIA" in the last 168 hours.  CBC: Recent Labs  Lab 12/01/21 2011 12/02/21  1219  WBC 6.3 5.6  HGB 10.3* 9.1*  HCT 31.1* 28.1*  MCV 86.1 86.7  PLT 147* 114*    Cardiac Enzymes: No results for input(s): "CKTOTAL", "CKMB", "CKMBINDEX", "TROPONINI" in the last 168 hours.  BNP: BNP (last 3 results) Recent Labs    11/01/21 1142 11/12/21 1056 12/01/21 2011  BNP 17.9 27.8 41.3    ProBNP (last 3 results) No results for input(s): "PROBNP" in the last 8760 hours.   CBG: Recent Labs  Lab 12/02/21 0936  12/02/21 2101 12/02/21 2331 12/03/21 0428  GLUCAP 172* 357* 272* 234*    Coagulation Studies: No results for input(s): "LABPROT", "INR" in the last 72 hours.   Imaging   No results found.   Medications:     Current Medications:  aspirin EC  81 mg Oral Daily   carvedilol  3.125 mg Oral BID   DULoxetine  30 mg Oral QHS   enoxaparin (LOVENOX) injection  40 mg Subcutaneous Q24H   fenofibrate  160 mg Oral Daily   insulin aspart  0-20 Units Subcutaneous Q4H   ivabradine  7.5 mg Oral BID   levothyroxine  125 mcg Oral Daily   pantoprazole  40 mg Oral Daily   pregabalin  300 mg Oral BID   rosuvastatin  40 mg Oral Daily   sacubitril-valsartan  1 tablet Oral BID    Infusions:  promethazine (PHENERGAN) injection (IM or IVPB) 12.5 mg (12/02/21 1328)      Patient Profile   31 y.o. female with chronic systolic CHF/NICM, CAD, severe hypertriglyceridemia, type 3c DM, hx medical noncompliance.  Assessment/Plan   Acute on Chronic Systolic Heart Failure - likely NICM. - Echo (1/22): EF 35-40% Mod MR - Echo (11/28/20): EF 20-25% with moderate RV dysfunction and mild septal flattening. Mod-severe TR - Rheum serologies negative, but Rheumatoid factor mildly elevated at 15.5 - cMRI (9/22): EF 29%c/w prior myocarditis vs sarcoid. May have delayed chemo cardiotoxcity.  - R/LHC with single vessel RCA occlusion, RA 1, LVEDP 17, Fick CO 5.6/CI 3.88. - Echo (10/22): EF 45-50%, grade II DD, RV ok. - Echo (12/22): EF 45%, RV normal  - RHC (5/23) with elevated filling pressures and normal CO, RA 12 (with prominent v-waves), RV 47/14, PA 48/25 (37), PCW 25 - Echo (5/23): EF 45-50%, RV normal  - Awaiting echo - Chronically NYHA Class II-III, worse recently after holding diuretics and Entresto d/t AKI and hyperkalemia. Received 1L fluids IV in ED.  - BNP only 41. Volume okay after 80 IV lasix. Swtich to Torsemide 40 BID today (on torsemide 60 BID >>recently 80 BID and metolazone once/week PTA  but developed AKI). - Cardiomems placement as outpatient had been denied. She's had multiple admits for a/c CHF and recurrent AKIs. Will see if this can be performed during current admission. - Had poor response to Furoscix - Continue Entresto 24-26 mg bid.  - Continue Coreg 3.125 mg bid. - Continue Corlanor 7.5 mg bid.  - Off Jardiance due to frequent GU infections.  - Off spiro w/ hyperkalemia  - Continue Paramedince.    2. CKD w/ recent AKI -Scr baseline variable with multiple AKIs, typically 1.5-2.0 -Scr this admit 2.12>1.76, labs today -Follow with diuresis   3. CAD - Single vessel RCA occlusion (small co-dominant vessel) on cardiac cath 09/08. - Medical management.  - CP worse with deep inspiration. Doubt ACS. Mild volume overload may be contributing - HS troponin minimally elevated with flat trend - Continue aspirin + statin.   4.  Pulmonary hypertension - Mild to moderate on RHC 5/23. PVR 3.7 WU. - Can consider cautious trial of selective pulmonary vasodilators when better diuresed.   5. TR - Prominent v-waves in RA tracing on RHC 5/23. - Echo 5/23 shows only mild TR  - Repeat echo pending   6. Severe hyperTG - Due to LPL deficiency w/ recent pancreatitis. - Continue fenofibrate  - Triglycerides down to 150 this admit - Previously offered referral to [facility] for further management. She prefers to continue with her endocrinologist at [facility] who currently manages/follows.   7. Type 3c DM - Follows w/ endocrinology at [facility]. - 2/2 chronic pancreatitis    8. Chronic abdominal swelling/bloating - Seen at [facility] (11/20) for IBS, per chart review. - Had penumatosis intestinalis. She was started on pancreatic enzyme replacement. - EGD (10/21): normal, bx taken to assess for celiac. - CT abdomen (2/22) and RUQ US showed no obvious gallbladder stones. - HIDA (4/22): ended early due to tachypnea, but cystic duct and gallbladder appeared WNL. - CT abdomen 12/22  unremarkable  - US abdomen 11/25/21: No ascites - suspect primarily fatty liver   9. Poor Med Compliance - Continue paramedicine in the community, appreciate their assistance   Length of Stay: 0  FINCH, LINDSAY N, PA-C  12/03/2021, 9:21 AM  Advanced Heart Failure Team Pager [phone] (M-F; 7a - 5p)  Please contact [facility] for night-coverage after hours (4p -7a ) and weekends on [email or web address]   Patient seen with PA, agree with the above note.   History of nonischemic cardiomyopathy with difficulty balancing renal function and volume status. She was admitted again with volume overload/dyspnea and received IV Lasix overnight.  Today, she says she feels much better.  Her creatinine is 1.7 (lower and near her baseline).   General: NAD Neck: Thick, JVP difficult, no thyromegaly or thyroid nodule.  Lungs: Clear to auscultation bilaterally with normal respiratory effort. CV: Nondisplaced PMI.  Heart regular S1/S2, no S3/S4, no murmur.  No peripheral edema.  Normal pedal pulses.  Abdomen: Soft, nontender, no hepatosplenomegaly, mild distention.  Skin: Intact without lesions or rashes.  Neurologic: Alert and oriented x 3.  Psych: Normal affect. Extremities: No clubbing or cyanosis.  HEENT: Normal.   Volume status is difficult to discern on exam.  She feels considerably better today and does not look markedly volume overloaded on exam at this point.  - Stop IV Lasix, start torsemide 40 mg po bid (lower dose given recent AKI).   - We will plan on Cardiomems placement on Monday (orders placed) to try to manage her volume more effectively. Discussed risks/benefits with patient and she agrees to procedure.   Jennifer [name] 12/03/2021 2:41 PM

## 2021-12-03 NOTE — Assessment & Plan Note (Addendum)
Echocardiogram with mild reduction in LV systolic function with EF 45 to 50%. Mild to moderate MR,. New echocardiogram report is pending.   Documented urine output is 7354 ml Systolic blood pressure is 99 to 120 mmHg Her symptoms have improved but no back to baseline.  Plan to repeat dose of furosemide 80 mg IV x1 Continue heart failure management with carvedilol and entresto Not on SGLT 2 inh due to recurrent urinary tract infection.   Patient getting a pulmonary V/Q scan for further workup for chest pain and dyspnea.  08/2021 [place] lower extremities was negative for DVT.

## 2021-12-03 NOTE — Assessment & Plan Note (Signed)
Positive hypotension, plan to continue inotropic support with milrinone.

## 2021-12-03 NOTE — Progress Notes (Signed)
  Echocardiogram 2D Echocardiogram has been performed.  Jennifer [name] 12/03/2021, 8:16 AM

## 2021-12-03 NOTE — Assessment & Plan Note (Signed)
CKD stage 3b, hyponatremia.  Renal function with serum cr at 2,50 with K at 4,6 and serum bicarbonate at 20. Resumed diuresis with torsemide Follow up renal function in am.

## 2021-12-03 NOTE — Assessment & Plan Note (Signed)
Anemia of chronic disease. Follow up hgb is 9,1, with plt 114, wbc is 5,6 Plan to follow up as outpatient.

## 2021-12-03 NOTE — Hospital Course (Addendum)
Jennifer [name] was admitted to the hospital with the working diagnosis of decompensated diastolic heart failure.   31 yo female with the past medical history of astrocytoma sp resection and chemotherapy, heart failure, chronic kidney disease and T2DM who presented with 3 days of chest pain. Pressure like on her left precordium. Worse with deep inspiration. Positive epigastric abdominal pain. At home patient was off medications due to hypokalemia, she had sodium zirconium prescribed for hyper K. On her initial physical examination her blood pressure was 132/98, HR 101, RR 18 and 02 saturation 94%, lungs with no wheezing or rales, heart with S1 and S2 present and rhythmic, abdomen with no distention, trace lower extremity edema.   Na 132, K 4,5, Cl 98, bicarbonate 22, glucose 301 bun 54 cr 2.12 High sensitive troponin 33 and 56  Wbc 6,3 hgb 10.3 plt 147   Chest radiograph with no cardiomegaly and no infiltrates, no effusions  EKG 109 bpm, normal axis, qtc 504, sinus rhythm, J point elevation V2 to V3, with no significant ST segment or T wave changes.   Patient was placed on furosemide with improvement in her symptoms.  Prolonged hospitalization due renal failure.  09/11 right heart catheterization with mild pulmonary artery hypertension and right heart failure.  Successful cardiomems implantation.  Patient placed on midodrine to optimize RV failure.  09/12 patient with hypotension requiring further adjustments to heart failure regimen. Changed to 500 u insulin for glucose control.

## 2021-12-03 NOTE — Assessment & Plan Note (Signed)
Plan to follow up as outpatient.

## 2021-12-04 DIAGNOSIS — E1169 Type 2 diabetes mellitus with other specified complication: Secondary | ICD-10-CM | POA: Diagnosis not present

## 2021-12-04 DIAGNOSIS — I10 Essential (primary) hypertension: Secondary | ICD-10-CM | POA: Diagnosis not present

## 2021-12-04 DIAGNOSIS — N179 Acute kidney failure, unspecified: Secondary | ICD-10-CM | POA: Diagnosis not present

## 2021-12-04 DIAGNOSIS — E039 Hypothyroidism, unspecified: Secondary | ICD-10-CM

## 2021-12-04 DIAGNOSIS — I5023 Acute on chronic systolic (congestive) heart failure: Secondary | ICD-10-CM | POA: Diagnosis not present

## 2021-12-04 LAB — GLUCOSE, CAPILLARY
Glucose-Capillary: 371 mg/dL — ABNORMAL HIGH (ref 70–99)
Glucose-Capillary: 383 mg/dL — ABNORMAL HIGH (ref 70–99)
Glucose-Capillary: 385 mg/dL — ABNORMAL HIGH (ref 70–99)
Glucose-Capillary: 449 mg/dL — ABNORMAL HIGH (ref 70–99)

## 2021-12-04 LAB — BASIC METABOLIC PANEL
Anion gap: 14 (ref 5–15)
BUN: 63 mg/dL — ABNORMAL HIGH (ref 6–20)
CO2: 21 mmol/L — ABNORMAL LOW (ref 22–32)
Calcium: 9.2 mg/dL (ref 8.9–10.3)
Chloride: 96 mmol/L — ABNORMAL LOW (ref 98–111)
Creatinine, Ser: 2.67 mg/dL — ABNORMAL HIGH (ref 0.44–1.00)
GFR, Estimated: 24 mL/min — ABNORMAL LOW (ref >60)
Glucose, Bld: 382 mg/dL — ABNORMAL HIGH (ref 70–99)
Potassium: 5.2 mmol/L — ABNORMAL HIGH (ref 3.5–5.1)
Sodium: 131 mmol/L — ABNORMAL LOW (ref 135–145)

## 2021-12-04 MED ORDER — ENOXAPARIN SODIUM 30 MG/0.3ML IJ SOSY
30.0000 mg | PREFILLED_SYRINGE | INTRAMUSCULAR | Status: DC
  Administered 2021-12-04 – 2021-12-11 (×8): 30 mg via SUBCUTANEOUS
  Filled 2021-12-04 (×8): qty 0.3

## 2021-12-04 MED ORDER — INSULIN ASPART 100 UNIT/ML IJ SOLN
10.0000 [IU] | Freq: Three times a day (TID) | INTRAMUSCULAR | Status: DC
  Administered 2021-12-04 – 2021-12-05 (×3): 10 [IU] via SUBCUTANEOUS

## 2021-12-04 MED ORDER — PREGABALIN 75 MG PO CAPS
150.0000 mg | ORAL_CAPSULE | Freq: Two times a day (BID) | ORAL | Status: DC
  Administered 2021-12-04 – 2021-12-06 (×5): 150 mg via ORAL
  Filled 2021-12-04 (×5): qty 2

## 2021-12-04 MED ORDER — INSULIN GLARGINE-YFGN 100 UNIT/ML ~~LOC~~ SOLN
50.0000 [IU] | Freq: Every day | SUBCUTANEOUS | Status: DC
Start: 2021-12-04 — End: 2021-12-06
  Administered 2021-12-04 – 2021-12-05 (×2): 50 [IU] via SUBCUTANEOUS
  Filled 2021-12-04 (×4): qty 0.5

## 2021-12-04 MED ORDER — SODIUM ZIRCONIUM CYCLOSILICATE 10 G PO PACK
10.0000 g | PACK | ORAL | Status: AC
  Administered 2021-12-04 (×2): 10 g via ORAL
  Filled 2021-12-04 (×2): qty 1

## 2021-12-04 NOTE — Progress Notes (Signed)
Rounding Note    Patient Name: Jennifer [name] Date of Encounter: 12/04/2021  [place] Cardiologist: Marigene Ehlers  Subjective   Some ongoing chest pains with deep breathing   Inpatient Medications    Scheduled Meds:  aspirin EC  81 mg Oral Daily   carvedilol  3.125 mg Oral BID   DULoxetine  30 mg Oral QHS   enoxaparin (LOVENOX) injection  40 mg Subcutaneous Q24H   fenofibrate  160 mg Oral Daily   insulin aspart  0-20 Units Subcutaneous TID WC   insulin aspart  0-5 Units Subcutaneous QHS   insulin aspart  4 Units Subcutaneous TID WC   insulin glargine-yfgn  30 Units Subcutaneous QHS   ivabradine  7.5 mg Oral BID   levothyroxine  125 mcg Oral Daily   pantoprazole  40 mg Oral Daily   pregabalin  300 mg Oral BID   rosuvastatin  40 mg Oral Daily   sacubitril-valsartan  1 tablet Oral BID   torsemide  40 mg Oral BID   Continuous Infusions:  promethazine (PHENERGAN) injection (IM or IVPB) 12.5 mg (12/02/21 1328)   PRN Meds: albuterol, dicyclomine, oxyCODONE-acetaminophen, promethazine (PHENERGAN) injection (IM or IVPB)   Vital Signs    Vitals:   12/03/21 0435 12/03/21 0825 12/03/21 1653 12/04/21 0515  BP: 120/65 99/67 126/67 122/68  Pulse: 92 95 90 82  Resp: 16 16 17 18   Temp: 98.2 F (36.8 C) 98.2 F (36.8 C) 98.3 F (36.8 C) 98.1 F (36.7 C)  TempSrc: Oral Oral Oral Oral  SpO2: 96%  90% 92%  Weight:    68.4 kg  Height:        Intake/Output Summary (Last 24 hours) at 12/04/2021 0740 Last data filed at 12/04/2021 0515 Gross per 24 hour  Intake 717 ml  Output 1950 ml  Net -1233 ml      12/04/2021    5:15 AM 12/01/2021    8:05 PM 11/25/2021   12:03 PM  Last 3 Weights  Weight (lbs) 150 lb 12.8 oz 147 lb 147 lb  Weight (kg) 68.402 kg 66.679 kg 66.679 kg      Telemetry    SR - Personally Reviewed  ECG    N/a - Personally Reviewed  Physical Exam   GEN: No acute distress.   Neck: No JVD Cardiac: RRR, no murmurs, rubs, or gallops.   Respiratory: Clear to auscultation bilaterally. GI: Soft, nontender, non-distended  MS: No edema; No deformity. Neuro:  Nonfocal  Psych: Normal affect   Labs    High Sensitivity Troponin:   Recent Labs  Lab 12/01/21 2011 12/01/21 2221 12/02/21 0520  TROPONINIHS 33* 56* 68*     Chemistry Recent Labs  Lab 12/01/21 2011 12/02/21 1219 12/03/21 1023  NA 133* 138 134*  K 4.5 4.5 4.9  CL 98 104 103  CO2 22 23 20*  GLUCOSE 301* 194* 259*  BUN 54* 48* 50*  CREATININE 2.12* 1.76* 2.45*  CALCIUM 9.5 8.5* 9.1  GFRNONAA 32* 39* 27*  ANIONGAP 13 11 11     Lipids  Recent Labs  Lab 12/02/21 1219  TRIG 150*    Hematology Recent Labs  Lab 12/01/21 2011 12/02/21 1219  WBC 6.3 5.6  RBC 3.61* 3.24*  HGB 10.3* 9.1*  HCT 31.1* 28.1*  MCV 86.1 86.7  MCH 28.5 28.1  MCHC 33.1 32.4  RDW 14.9 15.0  PLT 147* 114*   Thyroid No results for input(s): "TSH", "FREET4" in the last 168 hours.  BNP Recent Labs  Lab 12/01/21 2011  BNP 41.3    DDimer No results for input(s): "DDIMER" in the last 168 hours.   Radiology    NM Pulmonary Perfusion  Result Date: 12/03/2021 CLINICAL DATA:  Concern for pulmonary embolism. Chest pain for 1 week. Short of breath. EXAM: NUCLEAR MEDICINE PERFUSION LUNG SCAN TECHNIQUE: Perfusion images were obtained in multiple projections after intravenous injection of radiopharmaceutical. RADIOPHARMACEUTICALS:  4.0 mCi Tc-69mMAA COMPARISON:  Radiograph 12/03/2021 FINDINGS: no wedge-shaped peripheral perfusion defects within LEFT or RIGHT lung. Normal perfusion pattern IMPRESSION: No evidence acute pulmonary embolism. Electronically Signed   By: SSuzy BouchardM.D.   On: 12/03/2021 14:05   ECHOCARDIOGRAM COMPLETE  Result Date: 12/03/2021    ECHOCARDIOGRAM REPORT   Patient Name:   Jennifer MCCUTCHEONDate of Exam: 12/03/2021 Medical Rec #:  [id number]        Height:       57.0 in Accession #:    [id number]       Weight:       147.0 lb Date of Birth:  [birth year]         BSA:          1.578 m Patient Age:    31 years          BP:           120/65 mmHg Patient Gender: F                 HR:           99 bpm. Exam Location:  Inpatient Procedure: 2D Echo, Cardiac Doppler and Color Doppler Indications:    Chest pain  History:        Patient has prior history of Echocardiogram examinations, most                 recent 08/15/2021. Risk Factors:Diabetes and Hypertension.                 Non-ischemic cardiomyopathy. CKD.  Sonographer:    AClayton LefortRDCS (AE) Referring Phys: [id number] A SMITH IMPRESSIONS  1. Low normal to mild global reduction in LV systolic function; EF 50.  2. Left ventricular ejection fraction, by estimation, is 50 to 55%. The left ventricle has low normal function. The left ventricle has no regional wall motion abnormalities. Left ventricular diastolic parameters were normal.  3. Right ventricular systolic function is normal. The right ventricular size is normal. There is mildly elevated pulmonary artery systolic pressure.  4. The mitral valve is normal in structure. Mild mitral valve regurgitation. No evidence of mitral stenosis.  5. Tricuspid valve regurgitation is mild to moderate.  6. The aortic valve is tricuspid. Aortic valve regurgitation is mild. No aortic stenosis is present.  7. The inferior vena cava is normal in size with greater than 50% respiratory variability, suggesting right atrial pressure of 3 mmHg. FINDINGS  Left Ventricle: Left ventricular ejection fraction, by estimation, is 50 to 55%. The left ventricle has low normal function. The left ventricle has no regional wall motion abnormalities. The left ventricular internal cavity size was normal in size. There is no left ventricular hypertrophy. Left ventricular diastolic parameters were normal. Right Ventricle: The right ventricular size is normal. Right ventricular systolic function is normal. There is mildly elevated pulmonary artery systolic pressure. The tricuspid regurgitant velocity is  2.69 m/s, and with an assumed right atrial pressure of 8 mmHg, the estimated right ventricular systolic pressure is 368.0mmHg. Left Atrium: Left  atrial size was normal in size. Right Atrium: Right atrial size was normal in size. Pericardium: There is no evidence of pericardial effusion. Mitral Valve: The mitral valve is normal in structure. Mild mitral valve regurgitation. No evidence of mitral valve stenosis. Tricuspid Valve: The tricuspid valve is normal in structure. Tricuspid valve regurgitation is mild to moderate. No evidence of tricuspid stenosis. Aortic Valve: The aortic valve is tricuspid. Aortic valve regurgitation is mild. No aortic stenosis is present. Aortic valve mean gradient measures 4.0 mmHg. Aortic valve peak gradient measures 7.3 mmHg. Aortic valve area, by VTI measures 1.92 cm. Pulmonic Valve: The pulmonic valve was normal in structure. Pulmonic valve regurgitation is trivial. No evidence of pulmonic stenosis. Aorta: The aortic root is normal in size and structure. Venous: The inferior vena cava is normal in size with greater than 50% respiratory variability, suggesting right atrial pressure of 3 mmHg. IAS/Shunts: No atrial level shunt detected by color flow Doppler. Additional Comments: Low normal to mild global reduction in LV systolic function; EF 50.  LEFT VENTRICLE PLAX 2D LVIDd:         4.50 cm LVIDs:         3.40 cm LV PW:         0.90 cm LV IVS:        1.00 cm LVOT diam:     1.90 cm LV SV:         42 LV SV Index:   27 LVOT Area:     2.84 cm  RIGHT VENTRICLE             IVC RV Basal diam:  2.70 cm     IVC diam: 1.60 cm RV S prime:     11.40 cm/s TAPSE (M-mode): 1.9 cm LEFT ATRIUM             Index        RIGHT ATRIUM           Index LA diam:        2.40 cm 1.52 cm/m   RA Area:     17.00 cm LA Vol (A2C):   14.6 ml 9.25 ml/m   RA Volume:   49.10 ml  31.11 ml/m LA Vol (A4C):   28.5 ml 18.06 ml/m LA Biplane Vol: 21.3 ml 13.50 ml/m  AORTIC VALVE AV Area (Vmax):    1.84 cm AV Area  (Vmean):   1.89 cm AV Area (VTI):     1.92 cm AV Vmax:           135.00 cm/s AV Vmean:          88.500 cm/s AV VTI:            0.220 m AV Peak Grad:      7.3 mmHg AV Mean Grad:      4.0 mmHg LVOT Vmax:         87.80 cm/s LVOT Vmean:        59.100 cm/s LVOT VTI:          0.149 m LVOT/AV VTI ratio: 0.68  AORTA Ao Root diam: 2.50 cm Ao Asc diam:  2.80 cm TRICUSPID VALVE TR Peak grad:   28.9 mmHg TR Vmax:        269.00 cm/s  SHUNTS Systemic VTI:  0.15 m Systemic Diam: 1.90 cm Kirk Ruths MD Electronically signed by Kirk Ruths MD Signature Date/Time: 12/03/2021/12:44:00 PM    Final    DG Chest Port 1 View  Result Date: 12/03/2021  CLINICAL DATA:  Chest pain. EXAM: PORTABLE CHEST 1 VIEW COMPARISON:  December 01, 2021. FINDINGS: The heart size and mediastinal contours are within normal limits. Both lungs are clear. The visualized skeletal structures are unremarkable. IMPRESSION: No active disease. Electronically Signed   By: Marijo Conception M.D.   On: 12/03/2021 11:29    Cardiac Studies    Patient Profile  31 y.o. female with chronic systolic CHF/NICM, CAD, severe hypertriglyceridemia, type 3c DM, hx medical noncompliance.  Assessment & Plan    1.Acute on chronic systolic HF - LVEF as low as 20-25% 11/2020 - 07/2021 echo LVEF 45-50% - 10/2021 echo: LVEF 18-56%, normal diastolic, normal RV, mild MR, mild to mod TR - cMRI (9/22): EF 29%c/w prior myocarditis vs sarcoid. May have delayed chemo cardiotoxcity.  - - R/LHC with single vessel RCA occlusion, RA 1, LVEDP 17, Fick CO 5.6/CI 3.88. -She's had multiple admits for a/c CHF and recurrent AKIs. - HF considering cardiomems possibly Monday  -off jardiance due to frequent GU infections, off aldactone due to hyperkalemia - medical therapy with coreg 3.157m bid, corlanor 7.5, entresto 24/238mbid  - changed from IV lasix to oral torsemide 4048mid yesterday.  - with further AKI hold oral diuretic today. Appears euvolemic on exam.    2, CAD  -  -  R/LHC with single vessel RCA occlusion, RA 1, LVEDP 17, Fick CO 5.6/CI 3.88. - mild trop elevation in setting of HF, AKI. At this time do not suspect ACS -atypical chest pain worst with deep breathing, do not suspect ACS    For questions or updates, please contact [facility] consult [email or web address] for contact info under        Signed, BraCarlyle DollyD  12/04/2021, 7:40 AM

## 2021-12-04 NOTE — Progress Notes (Signed)
Inpatient Diabetes Program Recommendations  AACE/ADA: New Consensus Statement on Inpatient Glycemic Control (2015)  Target Ranges:  Prepandial:   less than 140 mg/dL      Peak postprandial:   less than 180 mg/dL (1-2 hours)      Critically ill patients:  140 - 180 mg/dL   Lab Results  Component Value Date   GLUCAP 371 (H) 12/04/2021   HGBA1C 7.9 (H) 08/15/2021    Review of Glycemic Control  Latest Reference Range & Units 12/03/21 16:19 12/03/21 21:38 12/04/21 08:19  Glucose-Capillary 70 - 99 mg/dL 331 (H) 340 (H) 371 (H)  Diabetes history: type 3c (per [facility]) Outpatient Diabetes medications: U-500 insulin 270 units TID, 100 units at HS Current orders for Inpatient glycemic control: Novolog 0-20 units TID every 4 hours, Novolog 4 units tid with meals, Semglee 30 units q HS   Inpatient Diabetes Program Recommendations:    Please consider increasing Semglee to 50 units q HS and increase Novolog meal coverage to 10 units tid with meals.   Thanks,  Adah Perl, RN, BC-ADM Inpatient Diabetes Coordinator Pager [phone]  (8a-5p)

## 2021-12-04 NOTE — Assessment & Plan Note (Signed)
Continue with levothyroxine

## 2021-12-04 NOTE — Progress Notes (Signed)
Progress Note   Patient: Jennifer [name] LPF:[id number] DOB: 14-May-1990 DOA: 12/01/2021     1 DOS: the patient was seen and examined on 12/04/2021   Brief hospital course: Jennifer [name] was admitted to the hospital with the working diagnosis of chest pain.   Jennifer [name] with the past medical history of astrocytoma sp resection and chemotherapy, heart failure, chronic kidney disease and T2DM who presented with 3 days of chest pain. Pressure like on her left precordium. Worse with deep inspiration. Positive epigastric abdominal pain. At home patient was off medications due to hypokalemia, she had sodium zirconium prescribed for hyper K. On her initial physical examination her blood pressure was 132/98, HR 101, RR 18 and 02 saturation 94%, lungs with no wheezing or rales, heart with S1 and S2 present and rhythmic, abdomen with no distention, trace lower extremity edema.   Na 132, K 4,5, Cl 98, bicarbonate 22, glucose 301 bun 54 cr 2.12 High sensitive troponin 33 and 56  Wbc 6,3 hgb 10.3 plt 147   Chest radiograph with no cardiomegaly and no infiltrates, no effusions  EKG 109 bpm, normal axis, qtc 504, sinus rhythm, J point elevation V2 to V3, with no significant ST segment or T wave changes.   Patient was placed on furosemide with improvement in her symptoms.  Plan for right heart catheterization and cardiomems on Monday.   Assessment and Plan: * Acute on chronic systolic CHF (congestive heart failure) (HCC) Echocardiogram preserved LV systolic function 50 to 53% with no wall motion abnormalities, RV systolic function is preserved, mild to moderate TR.   08/2021 [place] lower extremities was negative for DVT. V/Q scan with low probability for pulmonary embolism.   Documented urine output is 2992 ml Systolic blood pressure is 113 to 120 mmHg  Her volume status has improved and diuretic will be held for now.  Continue with carvedilol and entresto On Ivabradine.  Not on SGLT 2 inh due to  recurrent urinary tract infection.   Plan for cardiac catheterization on Monday and cardiomems.   Acute kidney injury superimposed on chronic kidney disease (HCC) CKD stage 3b, hyponatremia.  Her volume status has improved,  Yesterday her serum cr was at 2,45 with K at 4,9 and serum bicarbonate at 20. Na 134.  Holding on diuresis for today Follow up on renal function today.   Essential hypertension Continue blood pressure control with Entresto and carvedilol Holding diuresis for now.   Type 2 diabetes mellitus with hyperlipidemia (HCC) Hyperglycemia.  Fasting glucose this am 259, capillary 340, 371, 383.   Plan to increase basal insulin to 50 units and add pre meal 20 units.  Continue insulin sliding scale for glucose cover and monitoring  Continue with statin therapy.   Pancytopenia (HCC) Anemia of chronic disease. Follow up hgb is 9,1, with plt 114, wbc is 5,6 Plan to follow up as outpatient.   History of astrocytoma of brain Plan to follow up as outpatient.   Hypothyroidism Continue with levothyroxine         Subjective: Patient is feeling better, no chest pain and dyspnea has resolved,  Physical Exam: Vitals:   12/03/21 1653 12/04/21 0515 12/04/21 0950 12/04/21 1204  BP: 126/67 122/68 (!) 140/81 113/63  Pulse: 90 82  73  Resp: 17 18  16   Temp: 98.3 F (36.8 C) 98.1 F (36.7 C)  98.1 F (36.7 C)  TempSrc: Oral Oral  Oral  SpO2: 90% 92%  98%  Weight:  68.4 kg  Height:       Neurology awake and alert ENT with mild pallor Cardiovascular with S1 and S2 present and rhythmic Respiratory with no rales or wheezing Abdomen with no distention  No lower extremity edema  Data Reviewed:    Family Communication: friend at the bedside   Disposition: Status is: Inpatient Remains inpatient appropriate because: heart failure pending right heart catheterization   Planned Discharge Destination: Home    Author: Tawni Millers, MD 12/04/2021 2:58  PM  For on call review [email or web address].

## 2021-12-05 DIAGNOSIS — I10 Essential (primary) hypertension: Secondary | ICD-10-CM | POA: Diagnosis not present

## 2021-12-05 DIAGNOSIS — I5023 Acute on chronic systolic (congestive) heart failure: Secondary | ICD-10-CM | POA: Diagnosis not present

## 2021-12-05 DIAGNOSIS — E1169 Type 2 diabetes mellitus with other specified complication: Secondary | ICD-10-CM | POA: Diagnosis not present

## 2021-12-05 DIAGNOSIS — N179 Acute kidney failure, unspecified: Secondary | ICD-10-CM | POA: Diagnosis not present

## 2021-12-05 LAB — BASIC METABOLIC PANEL
Anion gap: 17 — ABNORMAL HIGH (ref 5–15)
BUN: 59 mg/dL — ABNORMAL HIGH (ref 6–20)
CO2: 20 mmol/L — ABNORMAL LOW (ref 22–32)
Calcium: 9.4 mg/dL (ref 8.9–10.3)
Chloride: 99 mmol/L (ref 98–111)
Creatinine, Ser: 2.5 mg/dL — ABNORMAL HIGH (ref 0.44–1.00)
GFR, Estimated: 26 mL/min — ABNORMAL LOW (ref >60)
Glucose, Bld: 374 mg/dL — ABNORMAL HIGH (ref 70–99)
Potassium: 4.6 mmol/L (ref 3.5–5.1)
Sodium: 136 mmol/L (ref 135–145)

## 2021-12-05 LAB — GLUCOSE, CAPILLARY
Glucose-Capillary: 368 mg/dL — ABNORMAL HIGH (ref 70–99)
Glucose-Capillary: 371 mg/dL — ABNORMAL HIGH (ref 70–99)
Glucose-Capillary: 387 mg/dL — ABNORMAL HIGH (ref 70–99)
Glucose-Capillary: 467 mg/dL — ABNORMAL HIGH (ref 70–99)
Glucose-Capillary: 483 mg/dL — ABNORMAL HIGH (ref 70–99)
Glucose-Capillary: 521 mg/dL (ref 70–99)

## 2021-12-05 MED ORDER — TORSEMIDE 20 MG PO TABS
40.0000 mg | ORAL_TABLET | Freq: Every day | ORAL | Status: DC
  Administered 2021-12-05 – 2021-12-06 (×2): 40 mg via ORAL
  Filled 2021-12-05 (×2): qty 2

## 2021-12-05 MED ORDER — INSULIN ASPART 100 UNIT/ML IJ SOLN
20.0000 [IU] | Freq: Three times a day (TID) | INTRAMUSCULAR | Status: DC
  Administered 2021-12-05 – 2021-12-07 (×5): 20 [IU] via SUBCUTANEOUS

## 2021-12-05 MED ORDER — SODIUM CHLORIDE 0.9% FLUSH: 3.0000 mL | INTRAVENOUS | Status: DC | PRN

## 2021-12-05 MED ORDER — SODIUM CHLORIDE 0.9 % IV SOLN: INTRAVENOUS | Status: DC

## 2021-12-05 MED ORDER — SODIUM CHLORIDE 0.9 % IV SOLN: 250.0000 mL | INTRAVENOUS | Status: DC | PRN

## 2021-12-05 MED ORDER — ASPIRIN 81 MG PO CHEW
81.0000 mg | CHEWABLE_TABLET | ORAL | Status: AC
  Administered 2021-12-06: 81 mg via ORAL
  Filled 2021-12-05: qty 1

## 2021-12-05 MED ORDER — INSULIN ASPART 100 UNIT/ML IJ SOLN
20.0000 [IU] | Freq: Once | INTRAMUSCULAR | Status: AC
  Administered 2021-12-05: 20 [IU] via SUBCUTANEOUS

## 2021-12-05 MED ORDER — INSULIN ASPART 100 UNIT/ML IJ SOLN
20.0000 [IU] | Freq: Once | INTRAMUSCULAR | Status: AC
Start: 2021-12-05 — End: 2021-12-05
  Administered 2021-12-05: 20 [IU] via SUBCUTANEOUS

## 2021-12-05 MED ORDER — SODIUM CHLORIDE 0.9% FLUSH
3.0000 mL | Freq: Two times a day (BID) | INTRAVENOUS | Status: DC
  Administered 2021-12-05 – 2021-12-10 (×3): 3 mL via INTRAVENOUS

## 2021-12-05 NOTE — Progress Notes (Signed)
Progress Note   Patient: Jennifer [name] DOB: Jul 10, 1990 DOA: 12/01/2021     2 DOS: the patient was seen and examined on 12/05/2021   Brief hospital course: [name] was admitted to the hospital with the working diagnosis of chest pain.   31 yo female with the past medical history of astrocytoma sp resection and chemotherapy, heart failure, chronic kidney disease and T2DM who presented with 3 days of chest pain. Pressure like on her left precordium. Worse with deep inspiration. Positive epigastric abdominal pain. At home patient was off medications due to hypokalemia, she had sodium zirconium prescribed for hyper K. On her initial physical examination her blood pressure was 132/98, HR 101, RR 18 and 02 saturation 94%, lungs with no wheezing or rales, heart with S1 and S2 present and rhythmic, abdomen with no distention, trace lower extremity edema.   Na 132, K 4,5, Cl 98, bicarbonate 22, glucose 301 bun 54 cr 2.12 High sensitive troponin 33 and 56  Wbc 6,3 hgb 10.3 plt 147   Chest radiograph with no cardiomegaly and no infiltrates, no effusions  EKG 109 bpm, normal axis, qtc 504, sinus rhythm, J point elevation V2 to V3, with no significant ST segment or T wave changes.   Patient was placed on furosemide with improvement in her symptoms.  Prolonged hospitalization due renal failure.  Plan for right heart catheterization and cardiomems on Monday.   Assessment and Plan: * Acute on chronic systolic CHF (congestive heart failure) (HCC) Echocardiogram preserved LV systolic function 50 to 12% with no wall motion abnormalities, RV systolic function is preserved, mild to moderate TR.   08/2021 [place] lower extremities was negative for DVT. V/Q scan with low probability for pulmonary embolism.   Medical therapy with carvedilol and entresto Continue with Ivabradine.  Resume torsemide 40 gm daily.  Not on SGLT 2 inh due to recurrent urinary tract infection.   Plan for cardiac  catheterization on Monday and cardiomems.   Acute kidney injury superimposed on chronic kidney disease (HCC) CKD stage 3b, hyponatremia.  Renal function with serum cr at 2,50 with K at 4,6 and serum bicarbonate at 20. Resumed diuresis with torsemide Follow up renal function in am.  Essential hypertension Continue blood pressure control with Entresto and carvedilol Resumed diuresis with torsemide.   Type 2 diabetes mellitus with hyperlipidemia (HCC) Hyperglycemia.  Fasting glucose this am 374.  Plan to increase basal insulin to 50 units and add pre meal 20 units.  Continue insulin sliding scale for glucose cover and monitoring  Continue with statin therapy.   Pancytopenia (HCC) Anemia of chronic disease. Follow up hgb is 9,1, with plt 114, wbc is 5,6 Plan to follow up as outpatient.   History of astrocytoma of brain Plan to follow up as outpatient.   Hypothyroidism Continue with levothyroxine         Subjective: Patient with no chest pain or dyspnea, no edema.   Physical Exam: Vitals:   12/04/21 2133 12/05/21 0435 12/05/21 0550 12/05/21 0856  BP: 134/73 (!) 107/55  (!) 148/83  Pulse: 80 82  82  Resp: 18     Temp: 97.9 F (36.6 C) 98 F (36.7 C)    TempSrc: Oral Oral    SpO2: 99% 95%    Weight:   66.5 kg   Height:       Neurology awake and alert ENT with mild pallor Cardiovascular with S1 and S2 present and rhythmic Respiratory with no rales or wheezing Abdomen with no  distention No lower extremity edema  Data Reviewed:    Family Communication: no family at the bedside   Disposition: Status is: Inpatient Remains inpatient appropriate because: heart failure   Planned Discharge Destination: Home     Author: Tawni Millers, MD 12/05/2021 3:10 PM  For on call review [email or web address].

## 2021-12-05 NOTE — Progress Notes (Addendum)
2054- CBG 449 Dr. [place] Crane notified and pt given scheduled Semglee 50 units and Novolog 5 units. MD stated to recheck pt CBG after treatment.  0038- CBG now 521 Dr. [place] Crane notified and orders placed to give pt 20 units of Novolog. Will recheck CBG.

## 2021-12-05 NOTE — Progress Notes (Signed)
Rounding Note    Patient Name: Jennifer [name] Date of Encounter: 12/05/2021   [facility] Cardiologist: Marigene Ehlers  Subjective   No complaints  Inpatient Medications    Scheduled Meds:  aspirin EC  81 mg Oral Daily   carvedilol  3.125 mg Oral BID   DULoxetine  30 mg Oral QHS   enoxaparin (LOVENOX) injection  30 mg Subcutaneous Q24H   fenofibrate  160 mg Oral Daily   insulin aspart  0-20 Units Subcutaneous TID WC   insulin aspart  0-5 Units Subcutaneous QHS   insulin aspart  10 Units Subcutaneous TID WC   insulin glargine-yfgn  50 Units Subcutaneous QHS   ivabradine  7.5 mg Oral BID   levothyroxine  125 mcg Oral Daily   pantoprazole  40 mg Oral Daily   pregabalin  150 mg Oral BID   rosuvastatin  40 mg Oral Daily   sacubitril-valsartan  1 tablet Oral BID   Continuous Infusions:  promethazine (PHENERGAN) injection (IM or IVPB) 12.5 mg (12/02/21 1328)   PRN Meds: albuterol, dicyclomine, oxyCODONE-acetaminophen, promethazine (PHENERGAN) injection (IM or IVPB)   Vital Signs    Vitals:   12/04/21 1649 12/04/21 2133 12/05/21 0435 12/05/21 0550  BP: 135/66 134/73 (!) 107/55   Pulse: 80 80 82   Resp: 16 18    Temp: 98.3 F (36.8 C) 97.9 F (36.6 C) 98 F (36.7 C)   TempSrc: Oral Oral Oral   SpO2: 99% 99% 95%   Weight:    66.5 kg  Height:        Intake/Output Summary (Last 24 hours) at 12/05/2021 0853 Last data filed at 12/05/2021 0435 Gross per 24 hour  Intake 1360 ml  Output 3200 ml  Net -1840 ml      12/05/2021    5:50 AM 12/04/2021    5:15 AM 12/01/2021    8:05 PM  Last 3 Weights  Weight (lbs) 146 lb 11.2 oz 150 lb 12.8 oz 147 lb  Weight (kg) 66.543 kg 68.402 kg 66.679 kg      Telemetry    SR - Personally Reviewed  ECG    N/a - Personally Reviewed  Physical Exam   GEN: No acute distress.   Neck: No JVD Cardiac: RRR, no murmurs, rubs, or gallops.  Respiratory: Clear to auscultation bilaterally. GI: Soft, nontender, non-distended  MS:  No edema; No deformity. Neuro:  Nonfocal  Psych: Normal affect   Labs    High Sensitivity Troponin:   Recent Labs  Lab 12/01/21 2011 12/01/21 2221 12/02/21 0520  TROPONINIHS 33* 56* 68*     Chemistry Recent Labs  Lab 12/03/21 1023 12/04/21 1433 12/05/21 0405  NA 134* 131* 136  K 4.9 5.2* 4.6  CL 103 96* 99  CO2 20* 21* 20*  GLUCOSE 259* 382* 374*  BUN 50* 63* 59*  CREATININE 2.45* 2.67* 2.50*  CALCIUM 9.1 9.2 9.4  GFRNONAA 27* 24* 26*  ANIONGAP 11 14 17*    Lipids  Recent Labs  Lab 12/02/21 1219  TRIG 150*    Hematology Recent Labs  Lab 12/01/21 2011 12/02/21 1219  WBC 6.3 5.6  RBC 3.61* 3.24*  HGB 10.3* 9.1*  HCT 31.1* 28.1*  MCV 86.1 86.7  MCH 28.5 28.1  MCHC 33.1 32.4  RDW 14.9 15.0  PLT 147* 114*   Thyroid No results for input(s): "TSH", "FREET4" in the last 168 hours.  BNP Recent Labs  Lab 12/01/21 2011  BNP 41.3    DDimer No results  for input(s): "DDIMER" in the last 168 hours.   Radiology    NM Pulmonary Perfusion  Result Date: 12/03/2021 CLINICAL DATA:  Concern for pulmonary embolism. Chest pain for 1 week. Short of breath. EXAM: NUCLEAR MEDICINE PERFUSION LUNG SCAN TECHNIQUE: Perfusion images were obtained in multiple projections after intravenous injection of radiopharmaceutical. RADIOPHARMACEUTICALS:  4.0 mCi Tc-4mMAA COMPARISON:  Radiograph 12/03/2021 FINDINGS: no wedge-shaped peripheral perfusion defects within LEFT or RIGHT lung. Normal perfusion pattern IMPRESSION: No evidence acute pulmonary embolism. Electronically Signed   By: SSuzy BouchardM.D.   On: 12/03/2021 14:05   DG Chest Port 1 View  Result Date: 12/03/2021 CLINICAL DATA:  Chest pain. EXAM: PORTABLE CHEST 1 VIEW COMPARISON:  December 01, 2021. FINDINGS: The heart size and mediastinal contours are within normal limits. Both lungs are clear. The visualized skeletal structures are unremarkable. IMPRESSION: No active disease. Electronically Signed   By: JMarijo ConceptionM.D.    On: 12/03/2021 11:29    Cardiac Studies    Patient Profile     31y.o. female with chronic systolic CHF/NICM, CAD, severe hypertriglyceridemia, type 3c DM, hx medical noncompliance  Assessment & Plan    1.Acute on chronic systolic HF - LVEF as low as 20-25% 11/2020 - 07/2021 echo LVEF 45-50% - 10/2021 echo: LVEF 578-24% normal diastolic, normal RV, mild MR, mild to mod TR - cMRI (9/22): EF 29%c/w prior myocarditis vs sarcoid. May have delayed chemo cardiotoxcity.  - - R/LHC with single vessel RCA occlusion, RA 1, LVEDP 17, Fick CO 5.6/CI 3.88. -She's had multiple admits for a/c CHF and recurrent AKIs. - HF teamconsidering cardiomems possibly Monday   -off jardiance due to frequent GU infections, off aldactone due to hyperkalemia - medical therapy with coreg 3.1285mbid, corlanor 7.5, entresto 24/2670mid   - changed from IV lasix to oral torsemide 24m82md. Held yesterday due to AKI that is resolving. Start back at oral torsemide 24mg18mt once daily for today. Cardiomems per HF team, I have made her npo at midnight.       2, CAD  -  - R/LHC with single vessel RCA occlusion, RA 1, LVEDP 17, Fick CO 5.6/CI 3.88. - mild trop elevation in setting of HF, AKI. At this time do not suspect ACS -atypical chest pain worst with deep breathing, do not suspect ACS  3. CKD Historically labile Cr, 1.8 to 3.22 over the last month  For questions or updates, please contact [facility] consult [email or web address] for contact info under        Signed, BrancCarlyle Dolly 12/05/2021, 8:53 AM

## 2021-12-05 NOTE — Progress Notes (Signed)
Pt CBG now 387 notified Dr. Nevada Crane of recheck.

## 2021-12-05 NOTE — Progress Notes (Signed)
Inpatient Diabetes Program Recommendations  AACE/ADA: New Consensus Statement on Inpatient Glycemic Control (2015)  Target Ranges:  Prepandial:   less than 140 mg/dL      Peak postprandial:   less than 180 mg/dL (1-2 hours)      Critically ill patients:  140 - 180 mg/dL   Lab Results  Component Value Date   GLUCAP 368 (H) 12/05/2021   HGBA1C 7.9 (H) 08/15/2021    Review of Glycemic Control  Latest Reference Range & Units 12/05/21 00:34 12/05/21 04:30 12/05/21 08:39  Glucose-Capillary 70 - 99 mg/dL 521 (HH) 387 (H) 368 (H)  Diabetes history: type 3c (per [facility]) Outpatient Diabetes medications: U-500 insulin 270 units TID, 100 units at HS Current orders for Inpatient glycemic control: Novolog 0-20 units TID with meals and HS, Novolog 10 units tid with meals, Semglee 50 units q HS  Inpatient Diabetes Program Recommendations:   Consider increasing Novolog meal coverage to 20 units tid with meals.   Thanks,  Adah Perl, RN, BC-ADM Inpatient Diabetes Coordinator Pager [phone]  (8a-5p)

## 2021-12-05 NOTE — Plan of Care (Signed)
  Problem: Clinical Measurements: Goal: Respiratory complications will improve Outcome: Progressing Goal: Cardiovascular complication will be avoided Outcome: Progressing   Problem: Activity: Goal: Risk for activity intolerance will decrease Outcome: Progressing   Problem: Elimination: Goal: Will not experience complications related to urinary retention Outcome: Progressing   Problem: Pain Managment: Goal: General experience of comfort will improve Outcome: Progressing   Problem: Safety: Goal: Ability to remain free from injury will improve Outcome: Progressing

## 2021-12-06 ENCOUNTER — Other Ambulatory Visit (HOSPITAL_COMMUNITY): Payer: Self-pay

## 2021-12-06 ENCOUNTER — Inpatient Hospital Stay (HOSPITAL_COMMUNITY)
Admission: EM | Disposition: A | Discharge status: Home / Self Care | Payer: Self-pay | Source: Home / Self Care | Attending: Internal Medicine

## 2021-12-06 ENCOUNTER — Encounter (HOSPITAL_COMMUNITY): Payer: Self-pay | Admitting: Internal Medicine

## 2021-12-06 DIAGNOSIS — I10 Essential (primary) hypertension: Secondary | ICD-10-CM | POA: Diagnosis not present

## 2021-12-06 DIAGNOSIS — N189 Chronic kidney disease, unspecified: Secondary | ICD-10-CM | POA: Diagnosis not present

## 2021-12-06 DIAGNOSIS — N179 Acute kidney failure, unspecified: Secondary | ICD-10-CM | POA: Diagnosis not present

## 2021-12-06 DIAGNOSIS — E1169 Type 2 diabetes mellitus with other specified complication: Secondary | ICD-10-CM | POA: Diagnosis not present

## 2021-12-06 DIAGNOSIS — I5023 Acute on chronic systolic (congestive) heart failure: Secondary | ICD-10-CM | POA: Diagnosis not present

## 2021-12-06 HISTORY — PX: RIGHT HEART CATH: CATH118263

## 2021-12-06 HISTORY — PX: PRESSURE SENSOR/CARDIOMEMS: CATH118258

## 2021-12-06 LAB — BASIC METABOLIC PANEL
Anion gap: 12 (ref 5–15)
BUN: 62 mg/dL — ABNORMAL HIGH (ref 6–20)
CO2: 22 mmol/L (ref 22–32)
Calcium: 9.6 mg/dL (ref 8.9–10.3)
Chloride: 97 mmol/L — ABNORMAL LOW (ref 98–111)
Creatinine, Ser: 2.48 mg/dL — ABNORMAL HIGH (ref 0.44–1.00)
GFR, Estimated: 26 mL/min — ABNORMAL LOW (ref >60)
Glucose, Bld: 467 mg/dL — ABNORMAL HIGH (ref 70–99)
Potassium: 4.6 mmol/L (ref 3.5–5.1)
Sodium: 131 mmol/L — ABNORMAL LOW (ref 135–145)

## 2021-12-06 LAB — POCT I-STAT EG7
Acid-Base Excess: 1 mmol/L (ref 0.0–2.0)
Acid-Base Excess: 1 mmol/L (ref 0.0–2.0)
Bicarbonate: 27.9 mmol/L (ref 20.0–28.0)
Bicarbonate: 28.2 mmol/L — ABNORMAL HIGH (ref 20.0–28.0)
Calcium, Ion: 1.27 mmol/L (ref 1.15–1.40)
Calcium, Ion: 1.31 mmol/L (ref 1.15–1.40)
HCT: 33 % — ABNORMAL LOW (ref 36.0–46.0)
HCT: 33 % — ABNORMAL LOW (ref 36.0–46.0)
Hemoglobin: 11.2 g/dL — ABNORMAL LOW (ref 12.0–15.0)
Hemoglobin: 11.2 g/dL — ABNORMAL LOW (ref 12.0–15.0)
O2 Saturation: 51 %
O2 Saturation: 54 %
Potassium: 4.5 mmol/L (ref 3.5–5.1)
Potassium: 4.6 mmol/L (ref 3.5–5.1)
Sodium: 131 mmol/L — ABNORMAL LOW (ref 135–145)
Sodium: 132 mmol/L — ABNORMAL LOW (ref 135–145)
TCO2: 30 mmol/L (ref 22–32)
TCO2: 30 mmol/L (ref 22–32)
pCO2, Ven: 56.7 mmHg (ref 44–60)
pCO2, Ven: 57 mmHg (ref 44–60)
pH, Ven: 7.3 (ref 7.25–7.43)
pH, Ven: 7.302 (ref 7.25–7.43)
pO2, Ven: 31 mmHg — CL (ref 32–45)
pO2, Ven: 32 mmHg (ref 32–45)

## 2021-12-06 LAB — CBC
HCT: 31.2 % — ABNORMAL LOW (ref 36.0–46.0)
Hemoglobin: 10.5 g/dL — ABNORMAL LOW (ref 12.0–15.0)
MCH: 29 pg (ref 26.0–34.0)
MCHC: 33.7 g/dL (ref 30.0–36.0)
MCV: 86.2 fL (ref 80.0–100.0)
Platelets: 137 10*3/uL — ABNORMAL LOW (ref 150–400)
RBC: 3.62 MIL/uL — ABNORMAL LOW (ref 3.87–5.11)
RDW: 14.8 % (ref 11.5–15.5)
WBC: 5.6 10*3/uL (ref 4.0–10.5)
nRBC: 0 % (ref 0.0–0.2)

## 2021-12-06 LAB — GLUCOSE, CAPILLARY
Glucose-Capillary: 448 mg/dL — ABNORMAL HIGH (ref 70–99)
Glucose-Capillary: 499 mg/dL — ABNORMAL HIGH (ref 70–99)
Glucose-Capillary: 443 mg/dL — ABNORMAL HIGH (ref 70–99)
Glucose-Capillary: 295 mg/dL — ABNORMAL HIGH (ref 70–99)
Glucose-Capillary: 211 mg/dL — ABNORMAL HIGH (ref 70–99)

## 2021-12-06 SURGERY — PRESSURE SENSOR/CARDIOMEMS
Anesthesia: LOCAL

## 2021-12-06 MED ORDER — MIDAZOLAM HCL 2 MG/2ML IJ SOLN
INTRAMUSCULAR | Status: AC
  Filled 2021-12-06: qty 2

## 2021-12-06 MED ORDER — FENTANYL CITRATE (PF) 100 MCG/2ML IJ SOLN
INTRAMUSCULAR | Status: DC | PRN
  Administered 2021-12-06: 25 ug via INTRAVENOUS

## 2021-12-06 MED ORDER — NITROGLYCERIN 0.4 MG SL SUBL
SUBLINGUAL_TABLET | SUBLINGUAL | Status: AC
  Filled 2021-12-06: qty 1

## 2021-12-06 MED ORDER — HYDROMORPHONE HCL 1 MG/ML IJ SOLN
0.5000 mg | Freq: Once | INTRAMUSCULAR | Status: AC
  Administered 2021-12-06: 0.5 mg via INTRAVENOUS
  Filled 2021-12-06: qty 1

## 2021-12-06 MED ORDER — FENTANYL CITRATE (PF) 100 MCG/2ML IJ SOLN
INTRAMUSCULAR | Status: AC
  Filled 2021-12-06: qty 2

## 2021-12-06 MED ORDER — NITROGLYCERIN 0.4 MG SL SUBL
0.4000 mg | SUBLINGUAL_TABLET | SUBLINGUAL | Status: AC | PRN
  Administered 2021-12-06 – 2021-12-08 (×3): 0.4 mg via SUBLINGUAL
  Filled 2021-12-06: qty 1

## 2021-12-06 MED ORDER — SODIUM CHLORIDE 0.9% FLUSH: 3.0000 mL | INTRAVENOUS | Status: DC | PRN

## 2021-12-06 MED ORDER — SODIUM CHLORIDE 0.9 % IV SOLN
250.0000 mL | INTRAVENOUS | Status: DC | PRN
  Administered 2021-12-06: 250 mL via INTRAVENOUS

## 2021-12-06 MED ORDER — ONDANSETRON HCL 4 MG/2ML IJ SOLN
4.0000 mg | Freq: Four times a day (QID) | INTRAMUSCULAR | Status: DC | PRN
  Administered 2021-12-10 (×2): 4 mg via INTRAVENOUS
  Filled 2021-12-06 (×2): qty 2

## 2021-12-06 MED ORDER — LIDOCAINE HCL (PF) 1 % IJ SOLN
INTRAMUSCULAR | Status: DC | PRN
  Administered 2021-12-06: 15 mL

## 2021-12-06 MED ORDER — HEPARIN (PORCINE) IN NACL 1000-0.9 UT/500ML-% IV SOLN
INTRAVENOUS | Status: DC | PRN
  Administered 2021-12-06: 500 mL

## 2021-12-06 MED ORDER — INSULIN ASPART 100 UNIT/ML IJ SOLN
20.0000 [IU] | Freq: Once | INTRAMUSCULAR | Status: AC
  Administered 2021-12-06: 20 [IU] via SUBCUTANEOUS

## 2021-12-06 MED ORDER — LABETALOL HCL 5 MG/ML IV SOLN: 10.0000 mg | INTRAVENOUS | Status: AC | PRN

## 2021-12-06 MED ORDER — INSULIN GLARGINE-YFGN 100 UNIT/ML ~~LOC~~ SOLN
30.0000 [IU] | Freq: Two times a day (BID) | SUBCUTANEOUS | Status: DC
  Administered 2021-12-06 – 2021-12-07 (×2): 30 [IU] via SUBCUTANEOUS
  Filled 2021-12-06 (×5): qty 0.3

## 2021-12-06 MED ORDER — MILRINONE LACTATE IN DEXTROSE 20-5 MG/100ML-% IV SOLN
0.1250 ug/kg/min | INTRAVENOUS | Status: AC
  Administered 2021-12-06 – 2021-12-09 (×3): 0.125 ug/kg/min via INTRAVENOUS
  Filled 2021-12-06 (×4): qty 100

## 2021-12-06 MED ORDER — ACETAMINOPHEN 325 MG PO TABS
650.0000 mg | ORAL_TABLET | ORAL | Status: DC | PRN
  Administered 2021-12-10: 650 mg via ORAL
  Filled 2021-12-06: qty 2

## 2021-12-06 MED ORDER — HEPARIN (PORCINE) IN NACL 1000-0.9 UT/500ML-% IV SOLN
INTRAVENOUS | Status: AC
  Filled 2021-12-06: qty 1000

## 2021-12-06 MED ORDER — HYDRALAZINE HCL 20 MG/ML IJ SOLN: 10.0000 mg | INTRAMUSCULAR | Status: AC | PRN

## 2021-12-06 MED ORDER — MIDAZOLAM HCL 2 MG/2ML IJ SOLN
INTRAMUSCULAR | Status: DC | PRN
  Administered 2021-12-06: 1 mg via INTRAVENOUS
  Administered 2021-12-06: 2 mg via INTRAVENOUS

## 2021-12-06 MED ORDER — IOHEXOL 350 MG/ML SOLN
INTRAVENOUS | Status: DC | PRN
  Administered 2021-12-06: 5 mL

## 2021-12-06 MED ORDER — SODIUM CHLORIDE 0.9% FLUSH
3.0000 mL | Freq: Two times a day (BID) | INTRAVENOUS | Status: DC
  Administered 2021-12-10 – 2021-12-12 (×4): 3 mL via INTRAVENOUS

## 2021-12-06 MED ORDER — LIDOCAINE HCL (PF) 1 % IJ SOLN
INTRAMUSCULAR | Status: AC
  Filled 2021-12-06: qty 30

## 2021-12-06 MED ORDER — INSULIN GLARGINE-YFGN 100 UNIT/ML ~~LOC~~ SOLN
30.0000 [IU] | Freq: Two times a day (BID) | SUBCUTANEOUS | Status: DC
  Filled 2021-12-06 (×2): qty 0.3

## 2021-12-06 MED ORDER — ENOXAPARIN SODIUM 40 MG/0.4ML IJ SOSY: 40.0000 mg | PREFILLED_SYRINGE | INTRAMUSCULAR | Status: DC

## 2021-12-06 SURGICAL SUPPLY — 14 items
CARDIOMEMS PA SENSOR W/DELIVER (Prosthesis & Implant Heart) ×1 IMPLANT
CATH SWAN GANZ 7F STRAIGHT (CATHETERS) IMPLANT
GUIDEWIRE .025 260CM (WIRE) IMPLANT
KIT MICROPUNCTURE NIT STIFF (SHEATH) IMPLANT
PACK CARDIAC CATHETERIZATION (CUSTOM PROCEDURE TRAY) ×1 IMPLANT
PROTECTION STATION PRESSURIZED (MISCELLANEOUS) ×1
SENSOR CARDIOMEMS PA W/DELIVER (Prosthesis & Implant Heart) IMPLANT
SHEATH FAST CATH 12F 12CM (SHEATH) IMPLANT
SHEATH PINNACLE 7F 10CM (SHEATH) IMPLANT
SHEATH PROBE COVER 6X72 (BAG) IMPLANT
STATION PROTECTION PRESSURIZED (MISCELLANEOUS) IMPLANT
TRANSDUCER W/STOPCOCK (MISCELLANEOUS) ×1 IMPLANT
WIRE EMERALD 3MM-J .035X260CM (WIRE) IMPLANT
WIRE NITREX .018X300 STIFF (WIRE) IMPLANT

## 2021-12-06 NOTE — Interval H&P Note (Signed)
History and Physical Interval Note:  12/06/2021 7:42 AM  Jennifer [name]  has presented today for surgery, with the diagnosis of heart failure.  The various methods of treatment have been discussed with the patient and family. After consideration of risks, benefits and other options for treatment, the patient has consented to  Procedure(s): PRESSURE SENSOR/CARDIOMEMS (N/A) RIGHT HEART CATH (N/A) as a surgical intervention.  The patient's history has been reviewed, patient examined, no change in status, stable for surgery.  I have reviewed the patient's chart and labs.  Questions were answered to the patient's satisfaction.     Jennifer [name]

## 2021-12-06 NOTE — Progress Notes (Signed)
Mobility Specialist - Progress Note   12/06/21 1453  Mobility  Activity Ambulated with assistance in hallway  Level of Assistance Standby assist, set-up cues, supervision of patient - no hands on  Assistive Device Other (Comment) (Iv pole)  Distance Ambulated (ft) 1000 ft  Activity Response Tolerated well  $Mobility charge 1 Mobility   Pt was received in bed and agreeable to mobility. Pt c/o RLE pain at the sight of her incision. Pt was returned to bed with all needs met and RN notified.   Larey Seat

## 2021-12-06 NOTE — Progress Notes (Addendum)
Progress Note   Patient: Jennifer [name] TOI:[id number] DOB: [birth year] DOA: 12/01/2021     3 DOS: the patient was seen and examined on 12/06/2021   Brief hospital course: [name] was admitted to the hospital with the working diagnosis of chest pain.   31 yo female with the past medical history of astrocytoma sp resection and chemotherapy, heart failure, chronic kidney disease and T2DM who presented with 3 days of chest pain. Pressure like on her left precordium. Worse with deep inspiration. Positive epigastric abdominal pain. At home patient was off medications due to hypokalemia, she had sodium zirconium prescribed for hyper K. On her initial physical examination her blood pressure was 132/98, HR 101, RR 18 and 02 saturation 94%, lungs with no wheezing or rales, heart with S1 and S2 present and rhythmic, abdomen with no distention, trace lower extremity edema.   Na 132, K 4,5, Cl 98, bicarbonate 22, glucose 301 bun 54 cr 2.12 High sensitive troponin 33 and 56  Wbc 6,3 hgb 10.3 plt 147   Chest radiograph with no cardiomegaly and no infiltrates, no effusions  EKG 109 bpm, normal axis, qtc 504, sinus rhythm, J point elevation V2 to V3, with no significant ST segment or T wave changes.   Patient was placed on furosemide with improvement in her symptoms.  Prolonged hospitalization due renal failure.  09/11 right heart catheterization with mild pulmonary artery hypertension and right heart failure.  Successful cardiomems implantation.  Patient placed on midodrine to optimize RV failure.   Assessment and Plan: * Acute on chronic systolic CHF (congestive heart failure) (HCC) Echocardiogram preserved LV systolic function 50 to 83% with no wall motion abnormalities, RV systolic function is preserved, mild to moderate TR.   08/2021 [place] lower extremities was negative for DVT. V/Q scan with low probability for pulmonary embolism.  09/11 right heart catheterization  PA 37/23, mean 28 PCW  16 Cardiac output 4,2 and index 2,6 (Fick) PVR 4.3  Predominant pre capillary pulmonary hypertension.   Plan to start patient on milrinone to optimize RV failure.  Carvedilol, entresto, ivabradine and diuresis with torsemide.  Not on SGLT 2 inh due to recurrent urinary tract infection.    Acute kidney injury superimposed on chronic kidney disease (HCC) CKD stage 3b, hyponatremia.  Renal function with serum cr at 2,48 with K at 4,6 and serum bicarbonate at 22. Na 131.  Plan to continue diuresis with torsemide and follow up renal function in am Check Mg in am  Essential hypertension Continue blood pressure control with Entresto and carvedilol Diuresis with torsemide.  Patient now on milrinone infusion for inotropic support.   Type 2 diabetes mellitus with hyperlipidemia (HCC) Hyperglycemia.  Fasting glucose this am 467.  Capillary has been high 448, 499 and 443. Patient with very high insulin resistance.   Plan to increase basal insulin from 50 to 60 units, will divide in 2 doses 30 bid.  Continue insulin sliding scale for glucose cover and monitoring Pre meal insulin 20 units.   Continue with statin therapy.   Pancytopenia (HCC) Anemia of chronic disease. Follow up hgb is 9,1, with plt 114, wbc is 5,6 Plan to follow up as outpatient.   History of astrocytoma of brain Plan to follow up as outpatient.   Hypothyroidism Continue with levothyroxine         Subjective: Patient with no chest pain, dyspnea continue to improve, her glucose has been elevated   Physical Exam: Vitals:   12/06/21 0920 12/06/21 0925 12/06/21 3825  12/06/21 0954  BP: 130/84 133/82 136/86 (!) 143/90  Pulse: 89 87 85 83  Resp: 16 18 18 18   Temp:   98.1 F (36.7 C) 97.9 F (36.6 C)  TempSrc:   Oral Oral  SpO2: 97% 97% 98%   Weight:      Height:       Neurology awake and alert ENT with mild pallor Cardiovascular with S1 and S2 present and rhythmic, no gallops, rubs or  murmurs Respiratory with no rales or rhonchi Abdomen not distended No lower extremity edema  Data Reviewed:    Family Communication: family at the bedside   Disposition: Status is: Inpatient Remains inpatient appropriate because: heart failure on inotropic infusion   Planned Discharge Destination: Home  Author: Tawni Millers, MD 12/06/2021 2:13 PM  For on call review [email or web address].

## 2021-12-06 NOTE — Progress Notes (Signed)
Site area: rt groin Site Prior to Removal:  Level 0 Pressure Applied For: 25 minutes Manual:   yes Patient Status During Pull:  stable Post Pull Site:  Level 0 Post Pull Instructions Given:  yes Post Pull Pulses Present: rt dp palpable Dressing Applied:  gauze and tegaderm Bedrest begins @ 0915 Comments:

## 2021-12-06 NOTE — Progress Notes (Signed)
Advanced Heart Failure Rounding Note   Subjective:    Had recurrent CP overnight. No relief with NTG. Says dilaudid is only thing that helps. Now resolved.   Denies SOB, orthopnea or PND.     Objective:   Weight Range:  Vital Signs:   Temp:  [98 F (36.7 C)-98.3 F (36.8 C)] 98 F (36.7 C) (09/11 0524) Pulse Rate:  [73-87] 87 (09/11 0524) Resp:  [15-16] 16 (09/11 0524) BP: (109-163)/(59-83) 109/59 (09/11 0524) SpO2:  [94 %-99 %] 94 % (09/11 0524) Weight:  [67.8 kg] 67.8 kg (09/11 0524) Last BM Date : 12/05/21  Weight change: Filed Weights   12/04/21 0515 12/05/21 0550 12/06/21 0524  Weight: 68.4 kg 66.5 kg 67.8 kg    Intake/Output:   Intake/Output Summary (Last 24 hours) at 12/06/2021 0736 Last data filed at 12/06/2021 0529 Gross per 24 hour  Intake 240 ml  Output 2850 ml  Net -2610 ml     Physical Exam: General:  Well appearing. No resp difficulty HEENT: normal Neck: supple. JVP . Carotids 2+ bilat; no bruits. No lymphadenopathy or thryomegaly appreciated. Cor: PMI nondisplaced. Regular rate & rhythm. No rubs, gallops or murmurs. Lungs: clear Abdomen: obese soft, nontender, nondistended. No hepatosplenomegaly. No bruits or masses. Good bowel sounds. Extremities: no cyanosis, clubbing, rash, edema Neuro: alert & orientedx3, cranial nerves grossly intact. moves all 4 extremities w/o difficulty. Affect pleasant  Telemetry: sinus 90s Personally reviewed   Labs: Basic Metabolic Panel: Recent Labs  Lab 12/02/21 1219 12/03/21 1023 12/04/21 1433 12/05/21 0405 12/06/21 0343  NA 138 134* 131* 136 131*  K 4.5 4.9 5.2* 4.6 4.6  CL 104 103 96* 99 97*  CO2 23 20* 21* 20* 22  GLUCOSE 194* 259* 382* 374* 467*  BUN 48* 50* 63* 59* 62*  CREATININE 1.76* 2.45* 2.67* 2.50* 2.48*  CALCIUM 8.5* 9.1 9.2 9.4 9.6    Liver Function Tests: No results for input(s): "AST", "ALT", "ALKPHOS", "BILITOT", "PROT", "ALBUMIN" in the last 168 hours. Recent Labs  Lab  12/02/21 1219 12/02/21 1902  LIPASE  --  28  AMYLASE 12*  --    No results for input(s): "AMMONIA" in the last 168 hours.  CBC: Recent Labs  Lab 12/01/21 2011 12/02/21 1219  WBC 6.3 5.6  HGB 10.3* 9.1*  HCT 31.1* 28.1*  MCV 86.1 86.7  PLT 147* 114*    Cardiac Enzymes: No results for input(s): "CKTOTAL", "CKMB", "CKMBINDEX", "TROPONINI" in the last 168 hours.  BNP: BNP (last 3 results) Recent Labs    11/01/21 1142 11/12/21 1056 12/01/21 2011  BNP 17.9 27.8 41.3    ProBNP (last 3 results) No results for input(s): "PROBNP" in the last 8760 hours.    Other results:  Imaging: No results found.   Medications:     Scheduled Medications:  [MAR Hold] aspirin EC  81 mg Oral Daily   [MAR Hold] carvedilol  3.125 mg Oral BID   [MAR Hold] DULoxetine  30 mg Oral QHS   [MAR Hold] enoxaparin (LOVENOX) injection  30 mg Subcutaneous Q24H   [MAR Hold] fenofibrate  160 mg Oral Daily   [MAR Hold] insulin aspart  0-20 Units Subcutaneous TID WC   [MAR Hold] insulin aspart  0-5 Units Subcutaneous QHS   [MAR Hold] insulin aspart  20 Units Subcutaneous TID WC   [MAR Hold] insulin glargine-yfgn  50 Units Subcutaneous QHS   [MAR Hold] ivabradine  7.5 mg Oral BID   [MAR Hold] levothyroxine  125 mcg  Oral Daily   [MAR Hold] pantoprazole  40 mg Oral Daily   [MAR Hold] pregabalin  150 mg Oral BID   [MAR Hold] rosuvastatin  40 mg Oral Daily   [MAR Hold] sacubitril-valsartan  1 tablet Oral BID   [MAR Hold] sodium chloride flush  3 mL Intravenous Q12H   [MAR Hold] torsemide  40 mg Oral Daily    Infusions:  sodium chloride     sodium chloride 10 mL/hr at 12/06/21 0557   [MAR Hold] promethazine (PHENERGAN) injection (IM or IVPB) 12.5 mg (12/02/21 1328)    PRN Medications: sodium chloride, [MAR Hold] albuterol, [MAR Hold] dicyclomine, [MAR Hold] nitroGLYCERIN, [MAR Hold] oxyCODONE-acetaminophen, [MAR Hold] promethazine (PHENERGAN) injection (IM or IVPB), sodium chloride  flush   Assessment/Plan:   Acute on Chronic Systolic Heart Failure - likely NICM. - Echo (1/22): EF 35-40% Mod MR - Echo (11/28/20): EF 20-25% with moderate RV dysfunction and mild septal flattening. Mod-severe TR - Rheum serologies negative, but Rheumatoid factor mildly elevated at 15.5 - cMRI (9/22): EF 29%c/w prior myocarditis vs sarcoid. May have delayed chemo cardiotoxcity.  - R/LHC with single vessel RCA occlusion, RA 1, LVEDP 17, Fick CO 5.6/CI 3.88. - Echo (10/22): EF 45-50%, grade II DD, RV ok. - Echo (12/22): EF 45%, RV normal  - RHC (5/23) with elevated filling pressures and normal CO, RA 12 (with prominent v-waves), RV 47/14, PA 48/25 (37), PCW 25 - Echo (5/23): EF 45-50%, RV normal  - Echo 9/23 EF 50-55%  - Awaiting echo - Chronically NYHA Class II-III, worse recently after holding diuretics and Entresto d/t AKI and hyperkalemia. Received 1L fluids IV in ED.  - BNP only 41. Volume okay after 80 IV lasix. - Volume status difficult to manage, For cardiomems today - Continue Entresto 24-26 mg bid. (May need to hold with CKD) - Continue Coreg 3.125 mg bid. - Continue Corlanor 7.5 mg bid.  - Off Jardiance due to frequent GU infections.  - Off spiro w/ hyperkalemia  - Continue Paramedince.    2. CKD w/ recent AKI -Scr baseline variable with multiple AKIs, typically 1.5-2.5 -Scr today 2.5 -Follow with diuresis   3. CAD - Single vessel RCA occlusion (small co-dominant vessel) on cardiac cath 09/08. - Medical management.  - CP worse with deep inspiration. Doubt ACS. Mild volume overload may be contributing - HS troponin minimally elevated with flat trend - Continue aspirin + statin.   4. Pulmonary hypertension - Mild to moderate on RHC 5/23. PVR 3.7 WU. - Can consider cautious trial of selective pulmonary vasodilators when better diuresed.   5. TR - Prominent v-waves in RA tracing on RHC 5/23. - Echo shows only mild to moderate TR  -   6. Severe hyperTG - Due to  LPL deficiency w/ recent pancreatitis. - Continue fenofibrate  - Triglycerides down to 150 this admit - Previously offered referral to [facility] for further management. She prefers to continue with her endocrinologist at [facility] who currently manages/follows.   7. DM3c - Follows w/ endocrinology at [facility]. - 2/2 chronic pancreatitis    8. Chronic abdominal swelling/bloating - Seen at [facility] (11/20) for IBS, per chart review. - Had penumatosis intestinalis. She was started on pancreatic enzyme replacement. - EGD (10/21): normal, bx taken to assess for celiac. - CT abdomen (2/22) and RUQ US showed no obvious gallbladder stones. - HIDA (4/22): ended early due to tachypnea, but cystic duct and gallbladder appeared WNL. - CT abdomen 12/22 unremarkable  - US abdomen  11/25/21: No ascites - suspect primarily fatty liver   9. CP - hstrop flat. Doubt ischemic - not candidate for cath with CKD 4   10. Poor Med Compliance - Continue paramedicine in the community, appreciate their assistance     Length of Stay: 3   Glori Bickers MD 12/06/2021, 7:36 AM  Advanced Heart Failure Team Pager [phone] (M-F; [place])  Please contact [facility] for night-coverage after hours (4p -7a ) and weekends on [email or web address]

## 2021-12-06 NOTE — H&P (View-Only) (Signed)
Advanced Heart Failure Rounding Note   Subjective:    Had recurrent CP overnight. No relief with NTG. Says dilaudid is only thing that helps. Now resolved.   Denies SOB, orthopnea or PND.     Objective:   Weight Range:  Vital Signs:   Temp:  [98 F (36.7 C)-98.3 F (36.8 C)] 98 F (36.7 C) (09/11 0524) Pulse Rate:  [73-87] 87 (09/11 0524) Resp:  [15-16] 16 (09/11 0524) BP: (109-163)/(59-83) 109/59 (09/11 0524) SpO2:  [94 %-99 %] 94 % (09/11 0524) Weight:  [67.8 kg] 67.8 kg (09/11 0524) Last BM Date : 12/05/21  Weight change: Filed Weights   12/04/21 0515 12/05/21 0550 12/06/21 0524  Weight: 68.4 kg 66.5 kg 67.8 kg    Intake/Output:   Intake/Output Summary (Last 24 hours) at 12/06/2021 0736 Last data filed at 12/06/2021 0529 Gross per 24 hour  Intake 240 ml  Output 2850 ml  Net -2610 ml     Physical Exam: General:  Well appearing. No resp difficulty HEENT: normal Neck: supple. JVP . Carotids 2+ bilat; no bruits. No lymphadenopathy or thryomegaly appreciated. Cor: PMI nondisplaced. Regular rate & rhythm. No rubs, gallops or murmurs. Lungs: clear Abdomen: obese soft, nontender, nondistended. No hepatosplenomegaly. No bruits or masses. Good bowel sounds. Extremities: no cyanosis, clubbing, rash, edema Neuro: alert & orientedx3, cranial nerves grossly intact. moves all 4 extremities w/o difficulty. Affect pleasant  Telemetry: sinus 90s Personally reviewed   Labs: Basic Metabolic Panel: Recent Labs  Lab 12/02/21 1219 12/03/21 1023 12/04/21 1433 12/05/21 0405 12/06/21 0343  NA 138 134* 131* 136 131*  K 4.5 4.9 5.2* 4.6 4.6  CL 104 103 96* 99 97*  CO2 23 20* 21* 20* 22  GLUCOSE 194* 259* 382* 374* 467*  BUN 48* 50* 63* 59* 62*  CREATININE 1.76* 2.45* 2.67* 2.50* 2.48*  CALCIUM 8.5* 9.1 9.2 9.4 9.6    Liver Function Tests: No results for input(s): "AST", "ALT", "ALKPHOS", "BILITOT", "PROT", "ALBUMIN" in the last 168 hours. Recent Labs  Lab  12/02/21 1219 12/02/21 1902  LIPASE  --  28  AMYLASE 12*  --    No results for input(s): "AMMONIA" in the last 168 hours.  CBC: Recent Labs  Lab 12/01/21 2011 12/02/21 1219  WBC 6.3 5.6  HGB 10.3* 9.1*  HCT 31.1* 28.1*  MCV 86.1 86.7  PLT 147* 114*    Cardiac Enzymes: No results for input(s): "CKTOTAL", "CKMB", "CKMBINDEX", "TROPONINI" in the last 168 hours.  BNP: BNP (last 3 results) Recent Labs    11/01/21 1142 11/12/21 1056 12/01/21 2011  BNP 17.9 27.8 41.3    ProBNP (last 3 results) No results for input(s): "PROBNP" in the last 8760 hours.    Other results:  Imaging: No results found.   Medications:     Scheduled Medications:  [MAR Hold] aspirin EC  81 mg Oral Daily   [MAR Hold] carvedilol  3.125 mg Oral BID   [MAR Hold] DULoxetine  30 mg Oral QHS   [MAR Hold] enoxaparin (LOVENOX) injection  30 mg Subcutaneous Q24H   [MAR Hold] fenofibrate  160 mg Oral Daily   [MAR Hold] insulin aspart  0-20 Units Subcutaneous TID WC   [MAR Hold] insulin aspart  0-5 Units Subcutaneous QHS   [MAR Hold] insulin aspart  20 Units Subcutaneous TID WC   [MAR Hold] insulin glargine-yfgn  50 Units Subcutaneous QHS   [MAR Hold] ivabradine  7.5 mg Oral BID   [MAR Hold] levothyroxine  125 mcg  Oral Daily   [MAR Hold] pantoprazole  40 mg Oral Daily   [MAR Hold] pregabalin  150 mg Oral BID   [MAR Hold] rosuvastatin  40 mg Oral Daily   [MAR Hold] sacubitril-valsartan  1 tablet Oral BID   [MAR Hold] sodium chloride flush  3 mL Intravenous Q12H   [MAR Hold] torsemide  40 mg Oral Daily    Infusions:  sodium chloride     sodium chloride 10 mL/hr at 12/06/21 0557   [MAR Hold] promethazine (PHENERGAN) injection (IM or IVPB) 12.5 mg (12/02/21 1328)    PRN Medications: sodium chloride, [MAR Hold] albuterol, [MAR Hold] dicyclomine, [MAR Hold] nitroGLYCERIN, [MAR Hold] oxyCODONE-acetaminophen, [MAR Hold] promethazine (PHENERGAN) injection (IM or IVPB), sodium chloride  flush   Assessment/Plan:   Acute on Chronic Systolic Heart Failure - likely NICM. - Echo (1/22): EF 35-40% Mod MR - Echo (11/28/20): EF 20-25% with moderate RV dysfunction and mild septal flattening. Mod-severe TR - Rheum serologies negative, but Rheumatoid factor mildly elevated at 15.5 - cMRI (9/22): EF 29%c/w prior myocarditis vs sarcoid. May have delayed chemo cardiotoxcity.  - R/LHC with single vessel RCA occlusion, RA 1, LVEDP 17, Fick CO 5.6/CI 3.88. - Echo (10/22): EF 45-50%, grade II DD, RV ok. - Echo (12/22): EF 45%, RV normal  - RHC (5/23) with elevated filling pressures and normal CO, RA 12 (with prominent v-waves), RV 47/14, PA 48/25 (37), PCW 25 - Echo (5/23): EF 45-50%, RV normal  - Echo 9/23 EF 50-55%  - Awaiting echo - Chronically NYHA Class II-III, worse recently after holding diuretics and Entresto d/t AKI and hyperkalemia. Received 1L fluids IV in ED.  - BNP only 41. Volume okay after 80 IV lasix. - Volume status difficult to manage, For cardiomems today - Continue Entresto 24-26 mg bid. (May need to hold with CKD) - Continue Coreg 3.125 mg bid. - Continue Corlanor 7.5 mg bid.  - Off Jardiance due to frequent GU infections.  - Off spiro w/ hyperkalemia  - Continue Paramedince.    2. CKD w/ recent AKI -Scr baseline variable with multiple AKIs, typically 1.5-2.5 -Scr today 2.5 -Follow with diuresis   3. CAD - Single vessel RCA occlusion (small co-dominant vessel) on cardiac cath 09/08. - Medical management.  - CP worse with deep inspiration. Doubt ACS. Mild volume overload may be contributing - HS troponin minimally elevated with flat trend - Continue aspirin + statin.   4. Pulmonary hypertension - Mild to moderate on RHC 5/23. PVR 3.7 WU. - Can consider cautious trial of selective pulmonary vasodilators when better diuresed.   5. TR - Prominent v-waves in RA tracing on RHC 5/23. - Echo shows only mild to moderate TR  -   6. Severe hyperTG - Due to  LPL deficiency w/ recent pancreatitis. - Continue fenofibrate  - Triglycerides down to 150 this admit - Previously offered referral to [facility] for further management. She prefers to continue with her endocrin[facility] who currently manages/follows.   7. DM3c - Follows w/ endocrinology at [facility]. - 2/2 chronic pancreatitis    8. Chronic abdominal swelling/bloating - Seen at [facility] (11/20) for IBS, per chart review. - Had penumatosis intestinalis. She was started on pancreatic enzyme replacement. - EGD (10/21): normal, bx taken to assess for celiac. - CT abdomen (2/22) and RUQ US showed no obvious gallbladder stones. - HIDA (4/22): ended early due to tachypnea, but cystic duct and gallbladder appeared WNL. - CT abdomen 12/22 unremarkable  - US abdomen  11/25/21: No ascites - suspect primarily fatty liver   9. CP - hstrop flat. Doubt ischemic - not candidate for cath with CKD 4   10. Poor Med Compliance - Continue paramedicine in the community, appreciate their assistance     Length of Stay: 3   Glori Bickers MD 12/06/2021, 7:36 AM  Advanced Heart Failure Team Pager [phone] (M-F; [place])  Please contact [facility] for night-coverage after hours (4p -7a ) and weekends on [email or web address]

## 2021-12-06 NOTE — Progress Notes (Signed)
Pt complained of CP 8/10 with radiation to L shoulder. EKG obtained showing mld ST elevation V1-V3. BP 163/73, O2 applied per standing orders,  SL NTG given x2 with drop in SBP to 120 after second dose. Pt reports minimal relief. Pt states IV dilaudid is the only thing that helped in the past. Provider on call paged with above information and awaiting return call/orders. Pt is for cardiac cath in the am. Jessie Foot, RN

## 2021-12-07 DIAGNOSIS — N179 Acute kidney failure, unspecified: Secondary | ICD-10-CM | POA: Diagnosis not present

## 2021-12-07 DIAGNOSIS — I5023 Acute on chronic systolic (congestive) heart failure: Secondary | ICD-10-CM | POA: Diagnosis not present

## 2021-12-07 DIAGNOSIS — I10 Essential (primary) hypertension: Secondary | ICD-10-CM | POA: Diagnosis not present

## 2021-12-07 DIAGNOSIS — E1169 Type 2 diabetes mellitus with other specified complication: Secondary | ICD-10-CM | POA: Diagnosis not present

## 2021-12-07 LAB — GLUCOSE, CAPILLARY
Glucose-Capillary: 420 mg/dL — ABNORMAL HIGH (ref 70–99)
Glucose-Capillary: 450 mg/dL — ABNORMAL HIGH (ref 70–99)
Glucose-Capillary: 450 mg/dL — ABNORMAL HIGH (ref 70–99)
Glucose-Capillary: 239 mg/dL — ABNORMAL HIGH (ref 70–99)

## 2021-12-07 LAB — BASIC METABOLIC PANEL
Anion gap: 14 (ref 5–15)
BUN: 79 mg/dL — ABNORMAL HIGH (ref 6–20)
CO2: 20 mmol/L — ABNORMAL LOW (ref 22–32)
Calcium: 9.3 mg/dL (ref 8.9–10.3)
Chloride: 95 mmol/L — ABNORMAL LOW (ref 98–111)
Creatinine, Ser: 3.33 mg/dL — ABNORMAL HIGH (ref 0.44–1.00)
GFR, Estimated: 18 mL/min — ABNORMAL LOW (ref >60)
Glucose, Bld: 341 mg/dL — ABNORMAL HIGH (ref 70–99)
Potassium: 4.5 mmol/L (ref 3.5–5.1)
Sodium: 129 mmol/L — ABNORMAL LOW (ref 135–145)

## 2021-12-07 MED ORDER — INSULIN REGULAR HUMAN (CONC) 500 UNIT/ML ~~LOC~~ SOPN
50.0000 [IU] | PEN_INJECTOR | Freq: Three times a day (TID) | SUBCUTANEOUS | Status: DC
  Administered 2021-12-07 – 2021-12-08 (×3): 50 [IU] via SUBCUTANEOUS
  Filled 2021-12-07: qty 3

## 2021-12-07 MED ORDER — INSULIN GLARGINE-YFGN 100 UNIT/ML ~~LOC~~ SOLN
10.0000 [IU] | Freq: Once | SUBCUTANEOUS | Status: DC
  Filled 2021-12-07: qty 0.1

## 2021-12-07 MED ORDER — INSULIN GLARGINE-YFGN 100 UNIT/ML ~~LOC~~ SOLN
40.0000 [IU] | Freq: Two times a day (BID) | SUBCUTANEOUS | Status: DC
  Filled 2021-12-07: qty 0.4

## 2021-12-07 MED ORDER — INSULIN ASPART 100 UNIT/ML IJ SOLN
0.0000 [IU] | INTRAMUSCULAR | Status: DC
  Administered 2021-12-07: 7 [IU] via SUBCUTANEOUS

## 2021-12-07 MED ORDER — PREGABALIN 75 MG PO CAPS
150.0000 mg | ORAL_CAPSULE | Freq: Every day | ORAL | Status: DC
  Administered 2021-12-07 – 2021-12-11 (×5): 150 mg via ORAL
  Filled 2021-12-07 (×5): qty 2

## 2021-12-07 MED FILL — Heparin Sod (Porcine)-NaCl IV Soln 1000 Unit/500ML-0.9%: INTRAVENOUS | Qty: 500 | Status: AC

## 2021-12-07 NOTE — Inpatient Diabetes Management (Signed)
Inpatient Diabetes Program Recommendations  AACE/ADA: New Consensus Statement on Inpatient Glycemic Control (2015)  Target Ranges:  Prepandial:   less than 140 mg/dL      Peak postprandial:   less than 180 mg/dL (1-2 hours)      Critically ill patients:  140 - 180 mg/dL   Lab Results  Component Value Date   GLUCAP 450 (H) 12/07/2021   HGBA1C 7.9 (H) 08/15/2021    Review of Glycemic Control  Diabetes history: DM Type 3c (per [facility]) Outpatient Diabetes medications: U-500 270 units TID, 100 units at HS Current orders for Inpatient glycemic control: Novolog 0-20 TID with meals and 0-5 HS + 20 units TID, Semglee 30 BID  HgbA1C - 7.9%  Inpatient Diabetes Program Recommendations:    U-500 50 units TID with meals    Continue Novolog 0-20 TID with meals and 0-5 HS  Secure text to Dr Cathlean Sauer.  Follow closely.  Thank you. Lorenda Peck, RD, LDN, [place] Inpatient Diabetes Coordinator [phone]

## 2021-12-07 NOTE — Progress Notes (Signed)
Mobility Specialist - Progress Note   12/07/21 1206  Mobility  Activity Ambulated with assistance in hallway  Level of Assistance Standby assist, set-up cues, supervision of patient - no hands on  Assistive Device Other (Comment) (IV pole)  Distance Ambulated (ft) 700 ft  Activity Response Tolerated well  $Mobility charge 1 Mobility    Post-mobility:106 HR,136/73 BP  Pt was received in bed and agreeable to mobility. No c/o pain throughout ambulation. Pt was returned to room with all needs met and NT present.   Larey Seat

## 2021-12-07 NOTE — Progress Notes (Addendum)
Progress Note   Patient: Jennifer [name] BHA:[id number] DOB: [birth year] DOA: 12/01/2021     4 DOS: the patient was seen and examined on 12/07/2021   Brief hospital course: Mrs. Defrancesco was admitted to the hospital with the working diagnosis of decompensated diastolic heart failure.   31 yo female with the past medical history of astrocytoma sp resection and chemotherapy, heart failure, chronic kidney disease and T2DM who presented with 3 days of chest pain. Pressure like on her left precordium. Worse with deep inspiration. Positive epigastric abdominal pain. At home patient was off medications due to hypokalemia, she had sodium zirconium prescribed for hyper K. On her initial physical examination her blood pressure was 132/98, HR 101, RR 18 and 02 saturation 94%, lungs with no wheezing or rales, heart with S1 and S2 present and rhythmic, abdomen with no distention, trace lower extremity edema.   Na 132, K 4,5, Cl 98, bicarbonate 22, glucose 301 bun 54 cr 2.12 High sensitive troponin 33 and 56  Wbc 6,3 hgb 10.3 plt 147   Chest radiograph with no cardiomegaly and no infiltrates, no effusions  EKG 109 bpm, normal axis, qtc 504, sinus rhythm, J point elevation V2 to V3, with no significant ST segment or T wave changes.   Patient was placed on furosemide with improvement in her symptoms.  Prolonged hospitalization due renal failure.  09/11 right heart catheterization with mild pulmonary artery hypertension and right heart failure.  Successful cardiomems implantation.  Patient placed on midodrine to optimize RV failure.  09/12 patient with hypotension requiring further adjustments to heart failure regimen. Changed to 500 u insulin for glucose control.   Assessment and Plan: * Acute on chronic systolic CHF (congestive heart failure) (HCC) Echocardiogram preserved LV systolic function 50 to 97% with no wall motion abnormalities, RV systolic function is preserved, mild to moderate TR.   08/2021  [place] lower extremities was negative for DVT. V/Q scan with low probability for pulmonary embolism.  09/11 right heart catheterization  PA 37/23, mean 28 PCW 16 Cardiac output 4,2 and index 2,6 (Fick) PVR 4.3  Predominant pre capillary pulmonary hypertension.   Continue with milrinone infusion optimize RV failure.  Holding Carvedilol, entresto, ivabradine and diuresis due to hypotension and worsening renal function. Not on SGLT 2 inh due to recurrent urinary tract infection and uncontrolled hyperglycemia.    Acute kidney injury superimposed on chronic kidney disease (HCC) CKD stage 3b, hyponatremia.  Patient with hypotension and signs of hypovolemia.  Renal function with serum cr at 3,33 with K at 4,5 and serum bicarbonate at 20. NA 129  Continue to hold on diuresis and follow up renal function in am.   Essential hypertension Positive hypotension, plan to continue inotropic support with milrinone.   Type 2 diabetes mellitus with hyperlipidemia (HCC) Hyperglycemia.  Fasting glucose this am 341.  Capillary has been high 211, 420 and 450 Patient with very high insulin resistance.   His basal insulin and short acting insulin have been modified and adjusted continuously but patient continue hyperglycemic.  Plan to transition to insulin U 500 50 units tid ( at home on 100 units tid) and continue with insulin sliding scale.  Close follow up on glucose covera and monitoring.   Continue with statin therapy.   Pancytopenia (HCC) Anemia of chronic disease. Follow up hgb is 9,1, with plt 114, wbc is 5,6 Plan to follow up as outpatient.   History of astrocytoma of brain Plan to follow up as outpatient.   Hypothyroidism Continue  with levothyroxine         Subjective: Patient with no chest pain or dyspnea, her blood pressure was low yesterday   Physical Exam: Vitals:   12/07/21 0147 12/07/21 0629 12/07/21 0847 12/07/21 1204  BP: (!) 89/48 133/72 127/67 136/73  Pulse:  91 94  (!) 104  Resp:  18 17 16   Temp:  98.2 F (36.8 C) 98.2 F (36.8 C) 97.9 F (36.6 C)  TempSrc:  Oral Oral Oral  SpO2:  93%    Weight:  67.7 kg    Height:       Neurology awake and alert ENT with no pallor Cardiovascular with S1 and S2 present and rhythmic with no gallops Respiratory with no rales or rhonchi Abdomen with no distention  No lower extremity edema  Data Reviewed:    Family Communication: no family at the bedside   Disposition: Status is: Inpatient Remains inpatient appropriate because: hypotension and IV inotropic support   Planned Discharge Destination: Home     Author: Tawni Millers, MD 12/07/2021 3:22 PM  For on call review [email or web address].

## 2021-12-07 NOTE — TOC Initial Note (Signed)
Transition of Care [facility]) - Initial/Assessment Note    Patient Details  Name: Jennifer [name] MRN: [id number] Date of Birth: 1991/01/16  [facility] CM/SW Contact:    Bethena Roys, RN Phone Number: 12/07/2021, 4:38 PM  Clinical Narrative:  Risk for readmission assessment completed. Patient presented for chest pain. Patient post RHC-initiated on IV Milrinone. Heart Failure Team is following the patient. PTA patient was from home with family support. Case Manager will continue to follow for additional transition of care needs.                  Expected Discharge Plan: Home/Self Care Barriers to Discharge: Continued Medical Work up   Expected Discharge Plan and Services Expected Discharge Plan: Home/Self Care In-house Referral: Clinical Social Work Discharge Planning Services: CM Consult Post Acute Care Choice: NA Living arrangements for the past 2 months: Apartment                   DME Agency: NA     Prior Living Arrangements/Services Living arrangements for the past 2 months: Apartment Lives with:: Spouse Patient language and need for interpreter reviewed:: Yes        Need for Family Participation in Patient Care: Yes (Comment) Care giver support system in place?: Yes (comment) Current home services: DME (previous scale provided to the patient.) Criminal Activity/Legal Involvement Pertinent to Current Situation/Hospitalization: No - Comment as needed  Activities of Daily Living Home Assistive Devices/Equipment: None ADL Screening (condition at time of admission) Patient's cognitive ability adequate to safely complete daily activities?: Yes Is the patient deaf or have difficulty hearing?: No Does the patient have difficulty seeing, even when wearing glasses/contacts?: Yes Does the patient have difficulty concentrating, remembering, or making decisions?: No Patient able to express need for assistance with ADLs?: Yes Does the patient have  difficulty dressing or bathing?: No Independently performs ADLs?: Yes (appropriate for developmental age) Does the patient have difficulty walking or climbing stairs?: No Weakness of Legs: None Weakness of Arms/Hands: None  Permission Sought/Granted Permission sought to share information with : Case Manager       Alcohol / Substance Use: Not Applicable Psych Involvement: No (comment)  Admission diagnosis:  Elevated troponin [R77.8] Chest pain [R07.9] Chest pain, unspecified type [R07.9] Heart failure ([place]) [I50.9] Patient Active Problem List   Diagnosis Date Noted   Heart failure ([place]) 12/03/2021   Acute on chronic systolic CHF (congestive heart failure) ([place]) 12/02/2021   Elevated troponin 12/02/2021   Pancytopenia ([place]) 12/02/2021   Amenorrhea 12/02/2021   Hyperosmolar hyperglycemic state (HHS) ([place]) 08/26/2021   Small intestinal bacterial overgrowth (SIBO) 08/26/2021   Lower extremity edema 08/26/2021   Hyperkalemia 08/14/2021   Hx of insulin dependent diabetes mellitus 08/14/2021   Acute kidney injury superimposed on chronic kidney disease ([place]) 05/13/2021   Chest pain 03/10/2021   Acute on chronic combined systolic and diastolic CHF (congestive heart failure) ([place]) 02/09/2021   History of astrocytoma of brain 02/09/2021   Severe hyperglycemia due to diabetes mellitus ([place]) 01/25/2021   Diarrhea    Elevated transaminase level    Acute combined systolic and diastolic heart failure (HCC)    Nonischemic cardiomyopathy ([place])    Acute decompensated heart failure ([place])    Elevated liver enzymes    Acute renal failure superimposed on stage 3a chronic kidney disease ([place]) 11/26/2020   Abdominal pain 95/62/1308   Chronic systolic CHF (congestive heart failure) ([place]) 11/23/2020   Essential hypertension 11/23/2020   Hypertriglyceridemia 11/23/2020  Type 2 diabetes mellitus with hyperlipidemia ([place]) 10/10/2018   Hypothyroidism 10/10/2018   PCP:  Sue Lush,  PA-C Pharmacy:   [facility] [id number] - [address] [place] [address] Phone: [phone] Fax: [phone]  [id number] [place] [place] [address]Phone: [phone]: [phone] Readmission Risk Interventions    12/07/2021    4:36 PM  Readmission Risk Prevention Plan  Transportation Screening Complete  Medication Review ([place] Complete  PCP or Specialist appointment within 3-5 days of discharge Complete  HRI or [place]oles AcresComplete  SW Recovery Care/Counseling Consult Complete  [place] Applicable

## 2021-12-07 NOTE — Progress Notes (Addendum)
Advanced Heart Failure Rounding Note   Subjective:    09/11: S/p cardiomems placement. Started on milrinone 0.125 d/t RV failure.  RHC: RA 15 PA 37/23 (28) PCWP 16 Fick CO/CI 4.2/2.6 Thermo CO/CI 3.7/2.3 PVR 4.3 PA sat 51%, 54% PAPi 0.93  BP intermittently low overnight, down to 87O-67E systolic after PM meds. Experienced orthostatic dizziness. [name] was held.  BP improved this am.  Scr continues to trend up, 2.45>2.67>2.5>2.48> cardiomems placement (< 5 cc contrast) >3.33.  On Torsemide 40 mg daily.  Hyperglycemic, blood sugars averaging 300s-400s  Still having some shallow breathing but feeling somewhat better than she did on admit.    Objective:   Weight Range:  Vital Signs:   Temp:  [97.9 F (36.6 C)-98.2 F (36.8 C)] 98.2 F (36.8 C) (09/12 0629) Pulse Rate:  [79-94] 91 (09/12 0629) Resp:  [15-19] 18 (09/12 0629) BP: (84-170)/(47-103) 133/72 (09/12 0629) SpO2:  [93 %-98 %] 93 % (09/12 0629) Weight:  [67.7 kg] 67.7 kg (09/12 0629) Last BM Date : 12/05/21  Weight change: Filed Weights   12/05/21 0550 12/06/21 0524 12/07/21 0629  Weight: 66.5 kg 67.8 kg 67.7 kg    Intake/Output:   Intake/Output Summary (Last 24 hours) at 12/07/2021 0811 Last data filed at 12/07/2021 0600 Gross per 24 hour  Intake 482.07 ml  Output 850 ml  Net -367.93 ml     Physical Exam: General:  Sitting up in bed. HEENT: + Right eye ptosis Neck: supple. JVD difficult to assess. Carotids 2+ bilat; no bruits.  Cor: PMI nondisplaced. Regular rate & rhythm. No rubs, gallops or murmurs. Lungs: clear Abdomen: soft, nontender, + mildly distended.  Extremities: no cyanosis, clubbing, rash, edema Neuro: alert & orientedx3. moves all 4 extremities w/o difficulty. Affect pleasant   Telemetry: SR 80s-90s   Labs: Basic Metabolic Panel: Recent Labs  Lab 12/03/21 1023 12/04/21 1433 12/05/21 0405 12/06/21 0343 12/06/21 0807 12/07/21 0414  NA 134* 131* 136 131* 131*   132* 129*  K 4.9 5.2* 4.6 4.6 4.6  4.5 4.5  CL 103 96* 99 97*  --  95*  CO2 20* 21* 20* 22  --  20*  GLUCOSE 259* 382* 374* 467*  --  341*  BUN 50* 63* 59* 62*  --  79*  CREATININE 2.45* 2.67* 2.50* 2.48*  --  3.33*  CALCIUM 9.1 9.2 9.4 9.6  --  9.3    Liver Function Tests: No results for input(s): "AST", "ALT", "ALKPHOS", "BILITOT", "PROT", "ALBUMIN" in the last 168 hours. Recent Labs  Lab 12/02/21 1219 12/02/21 1902  LIPASE  --  28  AMYLASE 12*  --    No results for input(s): "AMMONIA" in the last 168 hours.  CBC: Recent Labs  Lab 12/01/21 2011 12/02/21 1219 12/06/21 0343 12/06/21 0807  WBC 6.3 5.6 5.6  --   HGB 10.3* 9.1* 10.5* 11.2*  11.2*  HCT 31.1* 28.1* 31.2* 33.0*  33.0*  MCV 86.1 86.7 86.2  --   PLT 147* 114* 137*  --     Cardiac Enzymes: No results for input(s): "CKTOTAL", "CKMB", "CKMBINDEX", "TROPONINI" in the last 168 hours.  BNP: BNP (last 3 results) Recent Labs    11/01/21 1142 11/12/21 1056 12/01/21 2011  BNP 17.9 27.8 41.3    ProBNP (last 3 results) No results for input(s): "PROBNP" in the last 8760 hours.    Other results:  Imaging: CARDIAC CATHETERIZATION  Result Date: 12/06/2021 Findings: RA = 15 RV = 41/15 PA = 37/23 (28)  PCW = 16 Fick cardiac output/index = 4.2/2.6 Thermo CO/CI = 3.7/2.3 PVR = 4.3 (TD) Ao sat = 94% PA sat = 51%, 54% Assessment: 1. Mild PAH with primarily RV failure Plan/Discussion: Successful cardiomems implant. Start low-dose milrinone to optimize RV failure. Glori Bickers, MD 8:47 AM    Medications:     Scheduled Medications:  aspirin EC  81 mg Oral Daily   carvedilol  3.125 mg Oral BID   DULoxetine  30 mg Oral QHS   enoxaparin (LOVENOX) injection  30 mg Subcutaneous Q24H   fenofibrate  160 mg Oral Daily   insulin aspart  0-20 Units Subcutaneous TID WC   insulin aspart  0-5 Units Subcutaneous QHS   insulin aspart  20 Units Subcutaneous TID WC   insulin glargine-yfgn  30 Units Subcutaneous BID    ivabradine  7.5 mg Oral BID   levothyroxine  125 mcg Oral Daily   pantoprazole  40 mg Oral Daily   pregabalin  150 mg Oral BID   rosuvastatin  40 mg Oral Daily   sacubitril-valsartan  1 tablet Oral BID   sodium chloride flush  3 mL Intravenous Q12H   sodium chloride flush  3 mL Intravenous Q12H   torsemide  40 mg Oral Daily    Infusions:  sodium chloride 250 mL (12/06/21 1423)   milrinone 0.125 mcg/kg/min (12/06/21 1519)    PRN Medications: sodium chloride, acetaminophen, albuterol, dicyclomine, nitroGLYCERIN, ondansetron (ZOFRAN) IV, oxyCODONE-acetaminophen, sodium chloride flush   Assessment/Plan:   Acute on Chronic Systolic Heart Failure - likely NICM. - Echo (1/22): EF 35-40% Mod MR - Echo (11/28/20): EF 20-25% with moderate RV dysfunction and mild septal flattening. Mod-severe TR - Rheum serologies negative, but Rheumatoid factor mildly elevated at 15.5 - cMRI (9/22): EF 29%c/w prior myocarditis vs sarcoid. May have delayed chemo cardiotoxcity.  - R/LHC with single vessel RCA occlusion, RA 1, LVEDP 17, Fick CO 5.6/CI 3.88. - Echo (10/22): EF 45-50%, grade II DD, RV ok. - Echo (12/22): EF 45%, RV normal  - RHC (5/23) with elevated filling pressures and normal CO, RA 12 (with prominent v-waves), RV 47/14, PA 48/25 (37), PCW 25 - Echo (5/23): EF 45-50%, RV normal  - Echo (9/23): EF 50-55%, RV okay, ild MR, mild to moderate TR - S/p cardiomems placement 12/06/21 - RHC 12/06/21: RA 15, PA 37/23 (28), PCWP 16, Fick CO/CI 4.2/2.6, Thermo CO/CI 3.7/2.3, PVR 4.3, PA sat 51%, 54%, PAPi 0.93 - Chronically NYHA Class II-III, worse recently after holding diuretics and Entresto d/t AKI and hyperkalemia.  - Now admitted with primarily RV failure. Diuresed with IV lasix and transitioned to po Torsemide. Renal function worsening, Scr now > 3. - On low-dose milrinone 0.125 mcg.  -Hold Torsemide. Stop Entresto, Coreg, and Corlanor. BP now improving. Watch closely. May need to switch Milrinone  to Dobutamine. - Off Jardiance due to frequent GU infections.  - Off spiro w/ hyperkalemia.    2. CKD w/ recent AKI - suspect due to ATN -Scr baseline variable with multiple AKIs, typically 1.5-2.5 -Scr 2.5>2.7>2.5>3.33 -Suspect AKI in setting of diuretics and episodes of hypotension -Meds held as above. Watch closely   3. CAD - Single vessel RCA occlusion (small co-dominant vessel) on cardiac cath 09/08. - Medical management.  - CP worse with deep inspiration. Doubt ACS. Mild volume overload may be contributing - HS troponin minimally elevated with flat trend - Continue aspirin + statin.   4. Pulmonary hypertension - RHC 09/23: PA mean 28 and PVR 4.3 -  Can consider cautious trial of selective pulmonary vasodilators when better diuresed.   5. TR - Prominent v-waves in RA tracing on RHC 5/23. - Echo shows only mild to moderate TR    6. Severe hyperTG - Due to LPL deficiency w/ recent pancreatitis. - Continue fenofibrate  - Triglycerides down to 150 this admit - Previously offered referral to [facility] for further management. She prefers to continue with her endocrinologist at [facility] who currently manages/follows.   7. DM3c - Follows w/ endocrinology at [facility]. - Blood glucose uncontrolled. Management per primary team. - Diabetes coordinator has been consulted - 2/2 chronic pancreatitis    8. Chronic abdominal swelling/bloating - Seen at [facility] (11/20) for IBS, per chart review. - Had penumatosis intestinalis. She was started on pancreatic enzyme replacement. - EGD (10/21): normal, bx taken to assess for celiac. - CT abdomen (2/22) and RUQ US showed no obvious gallbladder stones. - HIDA (4/22): ended early due to tachypnea, but cystic duct and gallbladder appeared WNL. - CT abdomen 12/22 unremarkable  - US abdomen 11/25/21: No ascites - suspect primarily fatty liver   9. CP - hstrop flat. Doubt ischemic - not candidate for cath with CKD 4   10. Poor Med Compliance -  Continue paramedicine in the community, appreciate their assistance   Length of Stay: 4   FINCH, LINDSAY N MD 12/07/2021, 8:11 AM  Advanced Heart Failure Team Pager [phone] (M-F; [place])  Please contact [facility] for night-coverage after hours (4p -7a ) and weekends on [email or web address]   Patient seen and examined with the above-signed Advanced Practice Provider and/or Housestaff. I personally reviewed laboratory data, imaging studies and relevant notes. I independently examined the patient and formulated the important aspects of the plan. I have edited the note to reflect any of my changes or salient points. I have personally discussed the plan with the patient and/or family.  RHC yesterday with mild PAH and RV failure. Started on milrinone. Was hypotensive overnight. Meds held. SCr up to 3.3  Denies SOB, orthopnea or BNP. BP now improved  General:  Sitting up No resp difficulty HEENT: normal Neck: supple. JVP to jaw. Carotids 2+ bilat; no bruits. No lymphadenopathy or thryomegaly appreciated. Cor: PMI nondisplaced. Regular rate & rhythm. No rubs, gallops or murmurs. Lungs: clear Abdomen: obese soft, nontender, nondistended. No hepatosplenomegaly. No bruits or masses. Good bowel sounds. Extremities: no cyanosis, clubbing, rash, edema Neuro: alert & orientedx3, cranial nerves grossly intact. moves all 4 extremities w/o difficulty. Affect pleasant  She has AKI today suspect due to low BP and ATN. Holding BP meds and diuretics. Stop ibuprofen. Continue milrinone for now. If BP drops again will switch to dobutamine for RV support. Follow daily BMETs. Suspect Scr will climb again tomorrow.   Glori Bickers, MD  1:21 PM

## 2021-12-08 DIAGNOSIS — I5023 Acute on chronic systolic (congestive) heart failure: Secondary | ICD-10-CM | POA: Diagnosis not present

## 2021-12-08 DIAGNOSIS — I10 Essential (primary) hypertension: Secondary | ICD-10-CM | POA: Diagnosis not present

## 2021-12-08 DIAGNOSIS — N179 Acute kidney failure, unspecified: Secondary | ICD-10-CM | POA: Diagnosis not present

## 2021-12-08 DIAGNOSIS — E1169 Type 2 diabetes mellitus with other specified complication: Secondary | ICD-10-CM | POA: Diagnosis not present

## 2021-12-08 LAB — BASIC METABOLIC PANEL WITH GFR
Anion gap: 12 (ref 5–15)
BUN: 73 mg/dL — ABNORMAL HIGH (ref 6–20)
CO2: 21 mmol/L — ABNORMAL LOW (ref 22–32)
Calcium: 9.8 mg/dL (ref 8.9–10.3)
Chloride: 104 mmol/L (ref 98–111)
Creatinine, Ser: 2.29 mg/dL — ABNORMAL HIGH (ref 0.44–1.00)
GFR, Estimated: 29 mL/min — ABNORMAL LOW
Glucose, Bld: 104 mg/dL — ABNORMAL HIGH (ref 70–99)
Potassium: 3.9 mmol/L (ref 3.5–5.1)
Sodium: 137 mmol/L (ref 135–145)

## 2021-12-08 LAB — GLUCOSE, CAPILLARY
Glucose-Capillary: 113 mg/dL — ABNORMAL HIGH (ref 70–99)
Glucose-Capillary: 130 mg/dL — ABNORMAL HIGH (ref 70–99)
Glucose-Capillary: 159 mg/dL — ABNORMAL HIGH (ref 70–99)
Glucose-Capillary: 305 mg/dL — ABNORMAL HIGH (ref 70–99)
Glucose-Capillary: 350 mg/dL — ABNORMAL HIGH (ref 70–99)
Glucose-Capillary: 451 mg/dL — ABNORMAL HIGH (ref 70–99)
Glucose-Capillary: 48 mg/dL — ABNORMAL LOW (ref 70–99)
Glucose-Capillary: 497 mg/dL — ABNORMAL HIGH (ref 70–99)
Glucose-Capillary: 96 mg/dL (ref 70–99)

## 2021-12-08 MED ORDER — HYDROMORPHONE HCL 1 MG/ML IJ SOLN
0.2500 mg | Freq: Once | INTRAMUSCULAR | Status: AC
  Administered 2021-12-09: 0.25 mg via INTRAVENOUS
  Filled 2021-12-08: qty 1

## 2021-12-08 MED ORDER — INSULIN ASPART 100 UNIT/ML IJ SOLN
4.0000 [IU] | Freq: Once | INTRAMUSCULAR | Status: AC
  Administered 2021-12-08: 4 [IU] via SUBCUTANEOUS

## 2021-12-08 MED ORDER — INSULIN REGULAR HUMAN (CONC) 500 UNIT/ML ~~LOC~~ SOPN
75.0000 [IU] | PEN_INJECTOR | Freq: Three times a day (TID) | SUBCUTANEOUS | Status: DC
  Administered 2021-12-08: 75 [IU] via SUBCUTANEOUS
  Filled 2021-12-08: qty 3

## 2021-12-08 MED ORDER — TORSEMIDE 20 MG PO TABS
40.0000 mg | ORAL_TABLET | Freq: Once | ORAL | Status: AC
  Administered 2021-12-08: 40 mg via ORAL
  Filled 2021-12-08: qty 2

## 2021-12-08 MED ORDER — CARVEDILOL 3.125 MG PO TABS
3.1250 mg | ORAL_TABLET | Freq: Two times a day (BID) | ORAL | Status: DC
  Administered 2021-12-08: 3.125 mg via ORAL
  Filled 2021-12-08: qty 1

## 2021-12-08 NOTE — Progress Notes (Signed)
@  0330 Pt called out requesting CBG to be checked early. CBG 48 pt alert and oriented, states " I dont feel good". Pt requested OJ and peanut butter crackers and also drank most of 1 glucerna shake. Re check CBG at 0415 96, pt reported feeling better. 0557 CBG 113. Will continue to monitor. Jessie Foot, RN

## 2021-12-08 NOTE — Progress Notes (Signed)
Mobility Specialist - Progress Note   12/08/21 1545  Mobility  Activity Ambulated with assistance in hallway  Level of Assistance Standby assist, set-up cues, supervision of patient - no hands on  Assistive Device Other (Comment) (Iv pole)  Distance Ambulated (ft) 700 ft  Activity Response Tolerated well  $Mobility charge 1 Mobility    Pre-mobility: 117 HR During mobility: 133 HR Post-mobility: 120 HR  Pt was received in bed and agreeable to mobility. Pt had no complaints throughout ambulation. Pt was returned to EOB with all needs met.  Larey Seat

## 2021-12-08 NOTE — Progress Notes (Signed)
PROGRESS NOTE    Jennifer [name]  FYT:[id number] DOB: 1990-05-10 DOA: 12/01/2021 PCP: Sue Lush, PA-C   31 yo female with history of astrocytoma sp resection and chemotherapy, chronic systolic CHF, history of BiV failure with recovered EF, CKD 3b, brittle type 2 diabetes presented with 3 days of chest pain.  And epigastric pain. -Recently prescribed Lokelma for hyperkalemia -Chest radiograph with no cardiomegaly and no infiltrates, no effusions -Noted to be volume overloaded, followed by advanced heart failure team, started on diuretics, hospitalization prolonged with worsening renal failure, underwent right heart cath on 9/11-wedge pressure was 16, PA was 37/23 CO/CI was 4.2/2.6 -9/11 started on IV milrinone -Also with significant brittle diabetes, extremes of hypo and hyperglycemia  Subjective: -Some chronic abdominal bloating, otherwise feels better  Assessment and Plan:  Acute on chronic systolic CHF RV failure -History of BiV failure with recovered EF -Last echo with EF of 50-55%, mild MR, mild to moderate TR -Right heart cath 9/11 noted PA pressures of 37/23, wedge pressure of 16 -VQ scan was negative on admission -Recently diuretics and Entresto were held for AKI/hyperkalemia, admitted with volume overload, primarily RV failure -9/11 started on milrinone, diuretics held with worsening AKI -Now creatinine is improving, BP is more stable -CardioMEMS placed on 9/11 -Entresto, Corlanor and Coreg on hold [name], history of UTIs, off Aldactone with hyperkalemia  AKI on CKD3b -Worsening AKI with hypotension, likely ATN -Now improving on milrinone, diuretics on hold  Type 2 diabetes mellitus  Brittle diabetic with severe hyper and hypoglycemia, complicated by AKI, CKD 3B causing decreased insulin clearance -Restarted on U-500 yesterday, she takes > 100 units 3 times daily, restarted on lower dose 50 Mg 3 times daily with AKI, increased dose to 75 units  today  Anemia of chronic disease Mild thrombocytopenia -Anemia panel 12/22 with mild iron deficiency -Hemoglobin relatively stable, check CBC in a.m.  History of astrocytoma of brain -follow up as outpatient.   Hypothyroidism Continue  levothyroxine   Chronic abdominal pain and symptoms -Unremarkable work-up including EGD 10/21, CT abdomen 2/22, RUQ ultrasound 2/22, HIDA scan 4/22 abruptly terminated for respiratory symptoms, cystic duct was patent, CT abdomen 12/22 was unremarkable as well  DVT prophylaxis: Code Status:  Family Communication: Disposition Plan:   Consultants:    Procedures:   Antimicrobials:    Objective: Vitals:   12/07/21 1558 12/07/21 2100 12/08/21 0338 12/08/21 1339  BP: 131/71 133/85 (!) 164/94 137/78  Pulse: 99 (!) 105 (!) 109 (!) 106  Resp: 18 18 18 16   Temp: 97.9 F (36.6 C) 98 F (36.7 C) 98.3 F (36.8 C) 98.4 F (36.9 C)  TempSrc: Oral Oral Axillary Oral  SpO2:  96% 94% 98%  Weight:      Height:        Intake/Output Summary (Last 24 hours) at 12/08/2021 1540 Last data filed at 12/08/2021 0345 Gross per 24 hour  Intake 600 ml  Output 400 ml  Net 200 ml   Filed Weights   12/05/21 0550 12/06/21 0524 12/07/21 0629  Weight: 66.5 kg 67.8 kg 67.7 kg    Examination:  General exam: Appears calm and comfortable  Respiratory system: Clear to auscultation Cardiovascular system: S1 & S2 heard, RRR.  Abd: nondistended, soft and nontender.Normal bowel sounds heard. Central nervous system: Alert and oriented. No focal neurological deficits. Extremities: no edema Skin: No rashes Psychiatry:  Mood & affect appropriate.     Data Reviewed:   CBC: Recent Labs  Lab 12/01/21 2011 12/02/21 1219  12/06/21 0343 12/06/21 0807  WBC 6.3 5.6 5.6  --   HGB 10.3* 9.1* 10.5* 11.2*  11.2*  HCT 31.1* 28.1* 31.2* 33.0*  33.0*  MCV 86.1 86.7 86.2  --   PLT 147* 114* 137*  --    Basic Metabolic Panel: Recent Labs  Lab 12/04/21 1433  12/05/21 0405 12/06/21 0343 12/06/21 0807 12/07/21 0414 12/08/21 0443  NA 131* 136 131* 131*  132* 129* 137  K 5.2* 4.6 4.6 4.6  4.5 4.5 3.9  CL 96* 99 97*  --  95* 104  CO2 21* 20* 22  --  20* 21*  GLUCOSE 382* 374* 467*  --  341* 104*  BUN 63* 59* 62*  --  79* 73*  CREATININE 2.67* 2.50* 2.48*  --  3.33* 2.29*  CALCIUM 9.2 9.4 9.6  --  9.3 9.8   GFR: Estimated Creatinine Clearance: 28.5 mL/min (A) (by C-G formula based on SCr of 2.29 mg/dL (H)). Liver Function Tests: No results for input(s): "AST", "ALT", "ALKPHOS", "BILITOT", "PROT", "ALBUMIN" in the last 168 hours. Recent Labs  Lab 12/02/21 1219 12/02/21 1902  LIPASE  --  28  AMYLASE 12*  --    No results for input(s): "AMMONIA" in the last 168 hours. Coagulation Profile: No results for input(s): "INR", "PROTIME" in the last 168 hours. Cardiac Enzymes: No results for input(s): "CKTOTAL", "CKMB", "CKMBINDEX", "TROPONINI" in the last 168 hours. BNP (last 3 results) No results for input(s): "PROBNP" in the last 8760 hours. HbA1C: No results for input(s): "HGBA1C" in the last 72 hours. CBG: Recent Labs  Lab 12/08/21 0339 12/08/21 0415 12/08/21 0557 12/08/21 0813 12/08/21 1203  GLUCAP 48* 96 113* 130* 305*   Lipid Profile: No results for input(s): "CHOL", "HDL", "LDLCALC", "TRIG", "CHOLHDL", "LDLDIRECT" in the last 72 hours. Thyroid Function Tests: No results for input(s): "TSH", "T4TOTAL", "FREET4", "T3FREE", "THYROIDAB" in the last 72 hours. Anemia Panel: No results for input(s): "VITAMINB12", "FOLATE", "FERRITIN", "TIBC", "IRON", "RETICCTPCT" in the last 72 hours. Urine analysis:    Component Value Date/Time   COLORURINE YELLOW 08/26/2021 1706   APPEARANCEUR CLEAR 08/26/2021 1706   LABSPEC 1.007 08/26/2021 1706   PHURINE 5.0 08/26/2021 1706   GLUCOSEU >1,000 (A) 08/26/2021 1706   HGBUR NEGATIVE 08/26/2021 1706   BILIRUBINUR NEGATIVE 08/26/2021 1706   KETONESUR NEGATIVE 08/26/2021 1706   PROTEINUR  NEGATIVE 08/26/2021 1706   NITRITE NEGATIVE 08/26/2021 1706   LEUKOCYTESUR NEGATIVE 08/26/2021 1706   Sepsis Labs: @LABRCNTIP (procalcitonin:4,lacticidven:4)  )No results found for this or any previous visit (from the past 240 hour(s)).   Radiology Studies: No results found.   Scheduled Meds:  aspirin EC  81 mg Oral Daily   DULoxetine  30 mg Oral QHS   enoxaparin (LOVENOX) injection  30 mg Subcutaneous Q24H   fenofibrate  160 mg Oral Daily   insulin regular human CONCENTRATED  50 Units Subcutaneous TID WC   levothyroxine  125 mcg Oral Daily   pantoprazole  40 mg Oral Daily   pregabalin  150 mg Oral QHS   rosuvastatin  40 mg Oral Daily   sodium chloride flush  3 mL Intravenous Q12H   sodium chloride flush  3 mL Intravenous Q12H   Continuous Infusions:  sodium chloride 250 mL (12/06/21 1423)   milrinone 0.125 mcg/kg/min (12/08/21 0600)     LOS: 5 days    Time spent: 48mn  PDomenic Polite MD [facility]   12/08/2021, 3:40 PM

## 2021-12-08 NOTE — Progress Notes (Signed)
Pt complains of 5/10 chest pressure.  1 nitro given with no change. EKG done. Paged MD received order for one time dose of Dilaudid.

## 2021-12-08 NOTE — Progress Notes (Addendum)
Advanced Heart Failure Rounding Note   Subjective:    09/11: S/p cardiomems placement. Started on milrinone 0.125 d/t RV failure.  RHC: RA 15 PA 37/23 (28) PCWP 16 Fick CO/CI 4.2/2.6 Thermo CO/CI 3.7/2.3 PVR 4.3 PA sat 51%, 54% PAPi 0.93  Scr improved, 3.3>2.29  BP meds and diuretics on hold.  Hypotension resolved, BP now creeping up.  Feels okay. Has abdominal bloating which has been a chronic problem.    Objective:   Weight Range:  Vital Signs:   Temp:  [97.9 F (36.6 C)-98.3 F (36.8 C)] 98.3 F (36.8 C) (09/13 0338) Pulse Rate:  [99-109] 109 (09/13 0338) Resp:  [16-18] 18 (09/13 0338) BP: (131-164)/(71-94) 164/94 (09/13 0338) SpO2:  [94 %-96 %] 94 % (09/13 0338) Last BM Date : 12/05/21  Weight change: Filed Weights   12/05/21 0550 12/06/21 0524 12/07/21 0629  Weight: 66.5 kg 67.8 kg 67.7 kg    Intake/Output:   Intake/Output Summary (Last 24 hours) at 12/08/2021 1200 Last data filed at 12/08/2021 0345 Gross per 24 hour  Intake 600 ml  Output 400 ml  Net 200 ml     Physical Exam: General:  Sitting up in bed. No distress HEENT: normal Neck: supple. no JVD. Carotids 2+ bilat; no bruits.  Cor: PMI nondisplaced. Regular rate & rhythm. No rubs, gallops or murmurs. Lungs: clear Abdomen: soft, nontender, mildly distended Extremities: no cyanosis, clubbing, rash, edema Neuro: alert & orientedx3, cranial nerves grossly intact. moves all 4 extremities w/o difficulty. Affect pleasant    Telemetry: SR 90s-100s   Labs: Basic Metabolic Panel: Recent Labs  Lab 12/04/21 1433 12/05/21 0405 12/06/21 0343 12/06/21 0807 12/07/21 0414 12/08/21 0443  NA 131* 136 131* 131*  132* 129* 137  K 5.2* 4.6 4.6 4.6  4.5 4.5 3.9  CL 96* 99 97*  --  95* 104  CO2 21* 20* 22  --  20* 21*  GLUCOSE 382* 374* 467*  --  341* 104*  BUN 63* 59* 62*  --  79* 73*  CREATININE 2.67* 2.50* 2.48*  --  3.33* 2.29*  CALCIUM 9.2 9.4 9.6  --  9.3 9.8    Liver Function  Tests: No results for input(s): "AST", "ALT", "ALKPHOS", "BILITOT", "PROT", "ALBUMIN" in the last 168 hours. Recent Labs  Lab 12/02/21 1219 12/02/21 1902  LIPASE  --  28  AMYLASE 12*  --    No results for input(s): "AMMONIA" in the last 168 hours.  CBC: Recent Labs  Lab 12/01/21 2011 12/02/21 1219 12/06/21 0343 12/06/21 0807  WBC 6.3 5.6 5.6  --   HGB 10.3* 9.1* 10.5* 11.2*  11.2*  HCT 31.1* 28.1* 31.2* 33.0*  33.0*  MCV 86.1 86.7 86.2  --   PLT 147* 114* 137*  --     Cardiac Enzymes: No results for input(s): "CKTOTAL", "CKMB", "CKMBINDEX", "TROPONINI" in the last 168 hours.  BNP: BNP (last 3 results) Recent Labs    11/01/21 1142 11/12/21 1056 12/01/21 2011  BNP 17.9 27.8 41.3    ProBNP (last 3 results) No results for input(s): "PROBNP" in the last 8760 hours.    Other results:  Imaging: No results found.   Medications:     Scheduled Medications:  aspirin EC  81 mg Oral Daily   DULoxetine  30 mg Oral QHS   enoxaparin (LOVENOX) injection  30 mg Subcutaneous Q24H   fenofibrate  160 mg Oral Daily   insulin regular human CONCENTRATED  50 Units Subcutaneous TID WC  levothyroxine  125 mcg Oral Daily   pantoprazole  40 mg Oral Daily   pregabalin  150 mg Oral QHS   rosuvastatin  40 mg Oral Daily   sodium chloride flush  3 mL Intravenous Q12H   sodium chloride flush  3 mL Intravenous Q12H   torsemide  40 mg Oral Once    Infusions:  sodium chloride 250 mL (12/06/21 1423)   milrinone 0.125 mcg/kg/min (12/08/21 0600)    PRN Medications: sodium chloride, acetaminophen, albuterol, dicyclomine, nitroGLYCERIN, ondansetron (ZOFRAN) IV, oxyCODONE-acetaminophen, sodium chloride flush   Assessment/Plan:   Acute on Chronic Biventricular CHF with recovered EF - likely NICM. - Echo (1/22): EF 35-40% Mod MR - Echo (11/28/20): EF 20-25% with moderate RV dysfunction and mild septal flattening. 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 May have delayed chemo cardiotoxcity.  - R/LHC with single vessel RCA occlusion, RA 1, LVEDP 17, Fick CO 5.6/CI 3.88. - Echo (10/22): EF 45-50%, grade II DD, RV ok. - Echo (12/22): EF 45%, RV normal  - RHC (5/23) with elevated filling pressures and normal CO, RA 12 (with prominent v-waves), RV 47/14, PA 48/25 (37), PCW 25 - Echo (5/23): EF 45-50%, RV normal  - Echo (9/23): EF 50-55%, RV okay, ild MR, mild to moderate TR - S/p cardiomems placement 12/06/21 - RHC 12/06/21: RA 15, PA 37/23 (28), PCWP 16, Fick CO/CI 4.2/2.6, Thermo CO/CI 3.7/2.3, PVR 4.3, PA sat 51%, 54%, PAPi 0.93 - V/Q scan negative - Will need outpatient sleep study - Chronically NYHA Class II-III, worse recently after holding diuretics and Entresto d/t AKI and hyperkalemia.  - Now admitted with primarily RV failure. Continue low-dose milrinone 0.125 mcg. - Add back Torsemide 40 mg daily with improvement in renal function - Holding Entresto, Coreg, and Corlanor. BP and renal function now improving. Add back meds slowly tomorrow if renal function stable.  - Off Jardiance due to frequent GU infections.  - Off spiro w/ hyperkalemia.    2. CKD w/ recent AKI - suspect due to ATN -Scr baseline variable with multiple AKIs, typically 1.5-2.5 -Scr 2.5>2.7>2.5>3.33>2.29 -Suspect AKI in setting of diuretics and episodes of hypotension. BP meds/diuretics and ibuprofen held. -Improving. Continue to monitor closely   3. CAD - Single vessel RCA occlusion (small co-dominant vessel) on cardiac cath 09/08. - Medical management.  - CP this admit worse with deep inspiration. Doubt ACS. Mild volume overload may be contributing - HS troponin minimally elevated with flat trend - Continue aspirin + statin.   4. TR - Prominent v-waves in RA tracing on RHC 5/23. - Echo shows only mild to moderate TR    5. Severe hyperTG - Due to LPL deficiency w/ recent  pancreatitis. - Continue fenofibrate  - Triglycerides down to 150 this admit - Previously offered referral to [facility] for further management. She prefers to continue with her endocrinologist at [facility] who currently manages/follows.   6. DM3c - Follows w/ endocrinology at [facility]. - Blood glucose uncontrolled. Management per primary team. - 2/2 chronic pancreatitis    7. Chronic abdominal swelling/bloating - Seen at [facility] (11/20) for IBS, per chart review. - Had penumatosis intestinalis. She was started on pancreatic enzyme replacement. - EGD (10/21): normal, bx taken to assess for celiac. - CT abdomen (2/22) and RUQ US showed no obvious gallbladder stones. - HIDA (4/22): ended early due to tachypnea, but cystic duct and gallbladder appeared WNL. -  CT abdomen 12/22 unremarkable  - US abdomen 11/25/21: No ascites - Suspect RV failure may be playing a role  8. Poor Med Compliance - Continue paramedicine in the community, appreciate their assistance   Length of Stay: 5   FINCH, LINDSAY N MD 12/08/2021, 12:00 PM  Advanced Heart Failure Team Pager [phone] (M-F; [place])  Please contact [facility] for night-coverage after hours (4p -7a ) and weekends on [email or web address]   Patient seen and examined with the above-signed Advanced Practice Provider and/or Housestaff. I personally reviewed laboratory data, imaging studies and relevant notes. I independently examined the patient and formulated the important aspects of the plan. I have edited the note to reflect any of my changes or salient points. I have personally discussed the plan with the patient and/or family.   Remains on milrinone. Feeling better. BP has stabilized. Scr improved significantly. Denies CP or SOB. Still feels bloated.  General:  Sitting up on side of bed  No resp difficulty HEENT: normal Neck: supple JVP hard to see. Appears elevated Carotids 2+ bilat; no bruits. No lymphadenopathy or thryomegaly appreciated. Cor: PMI  nondisplaced. Regular rate & rhythm. No rubs, gallops or murmurs. Lungs: clear Abdomen: obese soft, nontender, nondistended. No hepatosplenomegaly. No bruits or masses. Good bowel sounds. Extremities: no cyanosis, clubbing, rash, tr edema Neuro: alert & orientedx3, cranial nerves grossly intact. moves all 4 extremities w/o difficulty. Affect pleasant  She has primarily RV failure in setting of mild to moderate PAH. Suspect mostly OSA/OHS. VQ was negative. Will continue milrinone until Scr nadirs. Restart oral torsemide. Follow renal function closely. Will need outpatient sleep study.   Glori Bickers, MD  12:20 PM

## 2021-12-08 NOTE — Progress Notes (Signed)
Paged by by nurse, patient is tachycardic and hypertensive.  Heart rate in 120s, most recent blood pressure 142/82.  Will restart carvedilol at 3.125 mg twice daily.

## 2021-12-08 NOTE — Progress Notes (Signed)
   12/08/21 1655  Assess: MEWS Score  BP (!) 142/82  MAP (mmHg) 99  ECG Heart Rate (!) 122  Assess: MEWS Score  MEWS Temp 0  MEWS Systolic 0  MEWS Pulse 2  MEWS RR 0  MEWS LOC 0  MEWS Score 2  MEWS Score Color Yellow  Assess: if the MEWS score is Yellow or Red  Were vital signs taken at a resting state? Yes  Focused Assessment No change from prior assessment  MEWS guidelines implemented *See Row Information* Yes  Escalate  MEWS: Escalate Yellow: discuss with charge nurse/RN and consider discussing with provider and RRT  Notify: Charge Nurse/RN  Name of Charge Nurse/RN Notified Skai Lickteig  Date Charge Nurse/RN Notified 12/08/21  Time Charge Nurse/RN Notified 1655  Document  Patient Outcome Other (Comment) (stable remains on unit)  Progress note created (see row info) Yes  Assess: SIRS CRITERIA  SIRS Temperature  0  SIRS Pulse 1  SIRS Respirations  0  SIRS WBC 1  SIRS Score Sum  2

## 2021-12-09 DIAGNOSIS — N179 Acute kidney failure, unspecified: Secondary | ICD-10-CM | POA: Diagnosis not present

## 2021-12-09 DIAGNOSIS — I10 Essential (primary) hypertension: Secondary | ICD-10-CM | POA: Diagnosis not present

## 2021-12-09 DIAGNOSIS — I5023 Acute on chronic systolic (congestive) heart failure: Secondary | ICD-10-CM | POA: Diagnosis not present

## 2021-12-09 DIAGNOSIS — E1169 Type 2 diabetes mellitus with other specified complication: Secondary | ICD-10-CM | POA: Diagnosis not present

## 2021-12-09 LAB — BASIC METABOLIC PANEL
Anion gap: 14 (ref 5–15)
BUN: 66 mg/dL — ABNORMAL HIGH (ref 6–20)
CO2: 23 mmol/L (ref 22–32)
Calcium: 9.8 mg/dL (ref 8.9–10.3)
Chloride: 99 mmol/L (ref 98–111)
Creatinine, Ser: 2.38 mg/dL — ABNORMAL HIGH (ref 0.44–1.00)
GFR, Estimated: 27 mL/min — ABNORMAL LOW (ref >60)
Glucose, Bld: 190 mg/dL — ABNORMAL HIGH (ref 70–99)
Potassium: 3.6 mmol/L (ref 3.5–5.1)
Sodium: 136 mmol/L (ref 135–145)

## 2021-12-09 LAB — CBC
HCT: 30.5 % — ABNORMAL LOW (ref 36.0–46.0)
Hemoglobin: 10.8 g/dL — ABNORMAL LOW (ref 12.0–15.0)
MCH: 29.9 pg (ref 26.0–34.0)
MCHC: 35.4 g/dL (ref 30.0–36.0)
MCV: 84.5 fL (ref 80.0–100.0)
Platelets: 165 10*3/uL (ref 150–400)
RBC: 3.61 MIL/uL — ABNORMAL LOW (ref 3.87–5.11)
RDW: 15.1 % (ref 11.5–15.5)
WBC: 5 10*3/uL (ref 4.0–10.5)
nRBC: 0 % (ref 0.0–0.2)

## 2021-12-09 LAB — TROPONIN I (HIGH SENSITIVITY)
Troponin I (High Sensitivity): 16 ng/L (ref <18)
Troponin I (High Sensitivity): 17 ng/L (ref <18)

## 2021-12-09 LAB — GLUCOSE, CAPILLARY
Glucose-Capillary: 146 mg/dL — ABNORMAL HIGH (ref 70–99)
Glucose-Capillary: 97 mg/dL (ref 70–99)
Glucose-Capillary: 134 mg/dL — ABNORMAL HIGH (ref 70–99)
Glucose-Capillary: 251 mg/dL — ABNORMAL HIGH (ref 70–99)
Glucose-Capillary: 322 mg/dL — ABNORMAL HIGH (ref 70–99)

## 2021-12-09 LAB — LIPASE, BLOOD: Lipase: 23 U/L (ref 11–51)

## 2021-12-09 LAB — SEDIMENTATION RATE: Sed Rate: 56 mm/hr — ABNORMAL HIGH (ref 0–22)

## 2021-12-09 MED ORDER — TORSEMIDE 20 MG PO TABS
40.0000 mg | ORAL_TABLET | Freq: Every day | ORAL | Status: DC
  Administered 2021-12-09 – 2021-12-10 (×2): 40 mg via ORAL
  Filled 2021-12-09 (×2): qty 2

## 2021-12-09 MED ORDER — INSULIN REGULAR HUMAN (CONC) 500 UNIT/ML ~~LOC~~ SOLN
75.0000 [IU] | Freq: Two times a day (BID) | SUBCUTANEOUS | Status: DC
  Filled 2021-12-09 (×2): qty 20

## 2021-12-09 MED ORDER — IVABRADINE HCL 5 MG PO TABS
5.0000 mg | ORAL_TABLET | Freq: Two times a day (BID) | ORAL | Status: DC
  Administered 2021-12-09 – 2021-12-12 (×6): 5 mg via ORAL
  Filled 2021-12-09 (×8): qty 1

## 2021-12-09 MED ORDER — INSULIN REGULAR HUMAN (CONC) 500 UNIT/ML ~~LOC~~ SOPN: 50.0000 [IU] | PEN_INJECTOR | Freq: Three times a day (TID) | SUBCUTANEOUS | Status: DC

## 2021-12-09 MED ORDER — HYDRALAZINE HCL 10 MG PO TABS
10.0000 mg | ORAL_TABLET | Freq: Three times a day (TID) | ORAL | Status: DC
  Administered 2021-12-09: 10 mg via ORAL
  Filled 2021-12-09: qty 1

## 2021-12-09 MED ORDER — POTASSIUM CHLORIDE CRYS ER 20 MEQ PO TBCR
20.0000 meq | EXTENDED_RELEASE_TABLET | Freq: Once | ORAL | Status: AC
  Administered 2021-12-09: 20 meq via ORAL
  Filled 2021-12-09: qty 1

## 2021-12-09 MED ORDER — ISOSORB DINITRATE-HYDRALAZINE 20-37.5 MG PO TABS
0.5000 | ORAL_TABLET | Freq: Three times a day (TID) | ORAL | Status: DC
  Administered 2021-12-09 (×2): 0.5 via ORAL
  Filled 2021-12-09 (×2): qty 1

## 2021-12-09 MED ORDER — INSULIN REGULAR HUMAN (CONC) 500 UNIT/ML ~~LOC~~ SOLN
50.0000 [IU] | Freq: Every day | SUBCUTANEOUS | Status: DC
  Administered 2021-12-09: 50 [IU] via SUBCUTANEOUS
  Filled 2021-12-09: qty 20

## 2021-12-09 NOTE — Progress Notes (Addendum)
Advanced Heart Failure Rounding Note   Subjective:    09/11: S/p cardiomems placement. Started on milrinone 0.125 d/t RV failure.  RHC: RA 15 PA 37/23 (28) PCWP 16 Fick CO/CI 4.2/2.6 Thermo CO/CI 3.7/2.3 PVR 4.3 PA sat 51%, 54% PAPi 0.93  Scr trending up, 3.3>2.29>2.38  32m Torsemide yesterday. -2.4L UOP  Hypotension resolved, BP now creeping up. Could be contributed by poor diet. Gets outside food from boyfriend.  Felt some CP overnight, still with 5/10 chest pain. EKG showed ST 121. No relief with Nitro but yes with Dilaudid. HsTrop flat 16>17. CP doesn't worsen with ambulation. Denies SOB.   Objective:   Weight Range:  Vital Signs:   Temp:  [97.7 F (36.5 C)-98.4 F (36.9 C)] 98.1 F (36.7 C) (09/14 0747) Pulse Rate:  [106-129] 119 (09/14 0747) Resp:  [16-20] 19 (09/14 0747) BP: (129-152)/(73-95) 129/79 (09/14 0747) SpO2:  [98 %-99 %] 99 % (09/14 0747) Weight:  [67.4 kg] 67.4 kg (09/14 0446) Last BM Date : 12/05/21  Weight change: Filed Weights   12/06/21 0524 12/07/21 0629 12/09/21 0446  Weight: 67.8 kg 67.7 kg 67.4 kg    Intake/Output:   Intake/Output Summary (Last 24 hours) at 12/09/2021 0835 Last data filed at 12/09/2021 0000 Gross per 24 hour  Intake 840 ml  Output 2350 ml  Net -1510 ml    Physical Exam: General:  ill appearing. Looks stated age. No respiratory difficulty HEENT: normal Neck: supple. JVD~7 cm. Carotids 2+ bilat; no bruits. No lymphadenopathy or thyromegaly appreciated. Cor: PMI nondisplaced. Regular rate & rhythm. No rubs, gallops or murmurs. Lungs: clear Abdomen: soft, nontender, nondistended. No hepatosplenomegaly. No bruits or masses. Good bowel sounds. Extremities: no cyanosis, clubbing, rash, edema  Neuro: alert & oriented x 3, cranial nerves grossly intact. moves all 4 extremities w/o difficulty. Affect pleasant.   Telemetry: ST 110s  Labs: Basic Metabolic Panel: Recent Labs  Lab 12/05/21 0405 12/06/21 0343  12/06/21 0807 12/07/21 0414 12/08/21 0443 12/09/21 0236  NA 136 131* 131*  132* 129* 137 136  K 4.6 4.6 4.6  4.5 4.5 3.9 3.6  CL 99 97*  --  95* 104 99  CO2 20* 22  --  20* 21* 23  GLUCOSE 374* 467*  --  341* 104* 190*  BUN 59* 62*  --  79* 73* 66*  CREATININE 2.50* 2.48*  --  3.33* 2.29* 2.38*  CALCIUM 9.4 9.6  --  9.3 9.8 9.8    Liver Function Tests: No results for input(s): "AST", "ALT", "ALKPHOS", "BILITOT", "PROT", "ALBUMIN" in the last 168 hours. Recent Labs  Lab 12/02/21 1219 12/02/21 1902  LIPASE  --  28  AMYLASE 12*  --    No results for input(s): "AMMONIA" in the last 168 hours.  CBC: Recent Labs  Lab 12/02/21 1219 12/06/21 0343 12/06/21 0807 12/09/21 0236  WBC 5.6 5.6  --  5.0  HGB 9.1* 10.5* 11.2*  11.2* 10.8*  HCT 28.1* 31.2* 33.0*  33.0* 30.5*  MCV 86.7 86.2  --  84.5  PLT 114* 137*  --  165    Cardiac Enzymes: No results for input(s): "CKTOTAL", "CKMB", "CKMBINDEX", "TROPONINI" in the last 168 hours.  BNP: BNP (last 3 results) Recent Labs    11/01/21 1142 11/12/21 1056 12/01/21 2011  BNP 17.9 27.8 41.3    ProBNP (last 3 results) No results for input(s): "PROBNP" in the last 8760 hours.   Other results:  Imaging: No results found.   Medications:  Scheduled Medications:  aspirin EC  81 mg Oral Daily   DULoxetine  30 mg Oral QHS   enoxaparin (LOVENOX) injection  30 mg Subcutaneous Q24H   fenofibrate  160 mg Oral Daily   insulin regular human CONCENTRATED  50 Units Subcutaneous QAC supper   insulin regular human CONCENTRATED  75 Units Subcutaneous BID WC   levothyroxine  125 mcg Oral Daily   pantoprazole  40 mg Oral Daily   pregabalin  150 mg Oral QHS   rosuvastatin  40 mg Oral Daily   sodium chloride flush  3 mL Intravenous Q12H   sodium chloride flush  3 mL Intravenous Q12H    Infusions:  sodium chloride 250 mL (12/06/21 1423)   milrinone 0.125 mcg/kg/min (12/09/21 0448)    PRN Medications: sodium chloride,  acetaminophen, albuterol, dicyclomine, ondansetron (ZOFRAN) IV, oxyCODONE-acetaminophen, sodium chloride flush   Assessment/Plan:   Acute on Chronic Biventricular CHF with recovered EF - likely NICM. - Echo (1/22): EF 35-40% Mod MR - Echo (11/28/20): EF 20-25% with moderate RV dysfunction and mild septal flattening. Mod-severe TR - Rheum serologies negative, but Rheumatoid factor mildly elevated at 15.5 - cMRI (9/22): EF 29%c/w prior myocarditis vs sarcoid. May have delayed chemo cardiotoxcity.  - R/LHC with single vessel RCA occlusion, RA 1, LVEDP 17, Fick CO 5.6/CI 3.88. - Echo (10/22): EF 45-50%, grade II DD, RV ok. - Echo (12/22): EF 45%, RV normal  - RHC (5/23) with elevated filling pressures and normal CO, RA 12 (with prominent v-waves), RV 47/14, PA 48/25 (37), PCW 25 - Echo (5/23): EF 45-50%, RV normal  - Echo (9/23): EF 50-55%, RV okay, mild MR, mild to moderate TR - S/p cardiomems placement 12/06/21 - RHC 12/06/21: RA 15, PA 37/23 (28), PCWP 16, Fick CO/CI 4.2/2.6, Thermo CO/CI 3.7/2.3, PVR 4.3, PA sat 51%, 54%, PAPi 0.93 - V/Q scan negative - Will need outpatient sleep study - Chronically NYHA Class II-III, worse recently after holding diuretics and Entresto d/t AKI and hyperkalemia.  - Now admitted with primarily RV failure. Continue low-dose milrinone 0.125 mcg. - Torsemide 40 mg x1 yesterday - Holding Entresto, Coreg, and Corlanor. BP and renal function now improving. Add back meds slowly tomorrow if renal function stable.  - Hypertensive to 150's yesterday, will add PO hydralazine 10 mg PO TID until we can restart GDMT.  - Coreg started yesterday per cardiology, stopping with low-OP/ bad RV.  - Off Jardiance due to frequent GU infections.  - Off spiro w/ hyperkalemia.    2. CKD w/ recent AKI - suspect due to ATN -Scr baseline variable with multiple AKIs, typically 1.5-2.5 -Scr 2.5>2.7>2.5>3.33>2.29>2.38 -Suspect AKI in setting of diuretics and episodes of  hypotension. -Torsemide 1x yesterday, Cr increased. -2.4L UOP. Hold diuretics today   3. CAD - Single vessel RCA occlusion (small co-dominant vessel) on cardiac cath 09/08. - Medical management.  - CP this admit worse with deep inspiration. Doubt ACS. Mild volume overload may be contributing - HS troponin minimally elevated with flat trend - Continue aspirin + statin.   4. TR - Prominent v-waves in RA tracing on RHC 5/23. - Echo shows only mild to moderate TR    5. Severe hyperTG - Due to LPL deficiency w/ recent pancreatitis. - Continue fenofibrate  - Triglycerides down to 150 this admit - Previously offered referral to [facility] for further management. She prefers to continue with her endocrinologist at [facility] who currently manages/follows.   6. DM3c - Follows w/ endocrinology at [facility]. -  Blood glucose uncontrolled. Management per primary team. - 2/2 chronic pancreatitis    7. Chronic abdominal swelling/bloating - Seen at [facility] (11/20) for IBS, per chart review. - Had penumatosis intestinalis. She was started on pancreatic enzyme replacement. - EGD (10/21): normal, bx taken to assess for celiac. - CT abdomen (2/22) and RUQ US showed no obvious gallbladder stones. - HIDA (4/22): ended early due to tachypnea, but cystic duct and gallbladder appeared WNL. - CT abdomen 12/22 unremarkable  - US abdomen 11/25/21: No ascites - Suspect RV failure may be playing a role  8. Poor Med Compliance - Continue paramedicine in the community, appreciate their assistance   Length of Stay: [place], AGACNP-BC  12/09/2021, 8:35 AM  Advanced Heart Failure Team Pager [phone] (M-F; 7a - 4p)  Please contact [facility] for night-coverage after hours (4p -7a ) and weekends on [email or web address]   Patient seen and examined with the above-signed Advanced Practice Provider and/or Housestaff. I personally reviewed laboratory data, imaging studies and relevant notes. I independently examined  the patient and formulated the important aspects of the plan. I have edited the note to reflect any of my changes or salient points. I have personally discussed the plan with the patient and/or family.  Remains on milrinone for RV support. Had CP overnight. Resolved with dilaudid. Hstrop normal.   Diuresed well with po torsemide but had high fluid intake. Remains tachycardic  General:  Sitting up on side of bed  No resp difficulty HEENT: normal + hursuit Neck: supple. JVP to jaw Carotids 2+ bilat; no bruits. No lymphadenopathy or thryomegaly appreciated. Cor: PMI nondisplaced. Tachy regular  Lungs: clear Abdomen: soft, nontender, nondistended. No hepatosplenomegaly. No bruits or masses. Good bowel sounds. Extremities: no cyanosis, clubbing, rash, 1-2+ edema Neuro: alert & orientedx3, cranial nerves grossly intact. moves all 4 extremities w/o difficulty. Affect pleasant  Remains tachycardic and volume overloaded. It is unclear to me why she is so tachycardic. Will restart home ivabradine. Needs further diuresis. Restart torsemide. Can give IV lasix as needed.   Glori Bickers, MD  6:49 PM

## 2021-12-09 NOTE — Progress Notes (Addendum)
PROGRESS NOTE    Jennifer [name]  RFF:[id number] DOB: [birth year] DOA: 12/01/2021 PCP: Sue Lush, PA-C   31 yo female with history of astrocytoma sp resection and chemotherapy, chronic systolic CHF, history of BiV failure with recovered EF, CKD 3b, brittle type 2 diabetes presented with 3 days of chest pain.  And epigastric pain. -Recently prescribed Lokelma for hyperkalemia -Chest radiograph with no cardiomegaly and no infiltrates, no effusions -Noted to be volume overloaded, followed by advanced heart failure team, started on diuretics, hospitalization prolonged with worsening renal failure, underwent right heart cath on 9/11-wedge pressure was 16, PA was 37/23 CO/CI was 4.2/2.6 -9/11 started on IV milrinone -Also with significant brittle diabetes, extremes of hypo and hyperglycemia  Subjective: -Feels okay overall, chronic abdominal distention, having BMs, no nausea or vomiting  Assessment and Plan:  Acute on chronic systolic CHF RV failure -History of BiV failure with recovered EF -Last echo with EF of 50-55%, mild MR, mild to moderate TR -Right heart cath 9/11 noted PA pressures of 37/23, wedge pressure of 16 -VQ scan was negative on admission -Recently diuretics and Entresto were held for AKI/hyperkalemia, admitted with volume overload, primarily RV failure -9/11 started on milrinone, diuretics held with worsening AKI -Creatinine stable from yesterday, oral torsemide resumed -CardioMEMS placed on 9/11 -Entresto, Corlanor and Coreg on hold [name], history of UTIs, off Aldactone with hyperkalemia, could rechallenge  AKI on CKD3b -Worsening AKI with hypotension, likely ATN -Now improving on milrinone, given dose of torsemide yesterday  Type 2 diabetes mellitus  Brittle diabetic with severe hyper and hypoglycemia, complicated by AKI, CKD 3B causing decreased insulin clearance -Restarted on U-500, she takes > 200 units for breakfast and lunch and 100 units for  dinner, further complicated by AKI and decreased renal clearance, increase daytime U-500 to 75 units and keep p.m. dose at 50 units today and monitor  Anemia of chronic disease Mild thrombocytopenia -Anemia panel 12/22 with mild iron deficiency -Hemoglobin relatively stable, check CBC in a.m.  History of astrocytoma of brain -follow up as outpatient.   Hypothyroidism Continue  levothyroxine   Severe hyperTG - Due to LPL deficiency, chronic pancreatitis -Continue fenofibrate -TG down to 150 this admission -Follow[facility]ocrinology at [facility]  Chronic abdominal pain and symptoms -Per GI notes from [facility], this has been suspected to be related to chronic pancreatitis from hypertriglyceridemia -No acute symptoms at this time  DVT prophylaxis: Lovenox Code Status: Full code Family Communication: With patient detail, no family at bedside Disposition Plan: Home likely 48 hours  Consultants: Advanced heart failure team   Procedures:   Antimicrobials:    Objective: Vitals:   12/08/21 2350 12/09/21 0413 12/09/21 0446 12/09/21 0747  BP: (!) 140/81 133/82  129/79  Pulse: (!) 129 (!) 121  (!) 119  Resp: 20 18  19   Temp: 98.1 F (36.7 C) 97.7 F (36.5 C)  98.1 F (36.7 C)  TempSrc: Oral Oral  Oral  SpO2:    99%  Weight:   67.4 kg   Height:        Intake/Output Summary (Last 24 hours) at 12/09/2021 1007 Last data filed at 12/09/2021 1000 Gross per 24 hour  Intake 1438 ml  Output 3000 ml  Net -1562 ml   Filed Weights   12/06/21 0524 12/07/21 0629 12/09/21 0446  Weight: 67.8 kg 67.7 kg 67.4 kg    Examination:  General exam: Chronic ill young female sitting up in bed, AAOx3, no distress Respiratory system: Clear to auscultation  Cardiovascular system: S1 & S2 heard, RRR.  Abd: Mildly distended, soft and nontender.Normal bowel sounds heard. Central nervous system: Alert and oriented. No focal neurological deficits. Extremities: no edema Skin: No rashes Psychiatry:  Mood  & affect appropriate.     Data Reviewed:   CBC: Recent Labs  Lab 12/02/21 1219 12/06/21 0343 12/06/21 0807 12/09/21 0236  WBC 5.6 5.6  --  5.0  HGB 9.1* 10.5* 11.2*  11.2* 10.8*  HCT 28.1* 31.2* 33.0*  33.0* 30.5*  MCV 86.7 86.2  --  84.5  PLT 114* 137*  --  353   Basic Metabolic Panel: Recent Labs  Lab 12/05/21 0405 12/06/21 0343 12/06/21 0807 12/07/21 0414 12/08/21 0443 12/09/21 0236  NA 136 131* 131*  132* 129* 137 136  K 4.6 4.6 4.6  4.5 4.5 3.9 3.6  CL 99 97*  --  95* 104 99  CO2 20* 22  --  20* 21* 23  GLUCOSE 374* 467*  --  341* 104* 190*  BUN 59* 62*  --  79* 73* 66*  CREATININE 2.50* 2.48*  --  3.33* 2.29* 2.38*  CALCIUM 9.4 9.6  --  9.3 9.8 9.8   GFR: Estimated Creatinine Clearance: 27.4 mL/min (A) (by C-G formula based on SCr of 2.38 mg/dL (H)). Liver Function Tests: No results for input(s): "AST", "ALT", "ALKPHOS", "BILITOT", "PROT", "ALBUMIN" in the last 168 hours. Recent Labs  Lab 12/02/21 1219 12/02/21 1902  LIPASE  --  28  AMYLASE 12*  --    No results for input(s): "AMMONIA" in the last 168 hours. Coagulation Profile: No results for input(s): "INR", "PROTIME" in the last 168 hours. Cardiac Enzymes: No results for input(s): "CKTOTAL", "CKMB", "CKMBINDEX", "TROPONINI" in the last 168 hours. BNP (last 3 results) No results for input(s): "PROBNP" in the last 8760 hours. HbA1C: No results for input(s): "HGBA1C" in the last 72 hours. CBG: Recent Labs  Lab 12/08/21 2101 12/08/21 2347 12/09/21 0408 12/09/21 0746 12/09/21 0959  GLUCAP 497* 350* 146* 97 134*   Lipid Profile: No results for input(s): "CHOL", "HDL", "LDLCALC", "TRIG", "CHOLHDL", "LDLDIRECT" in the last 72 hours. Thyroid Function Tests: No results for input(s): "TSH", "T4TOTAL", "FREET4", "T3FREE", "THYROIDAB" in the last 72 hours. Anemia Panel: No results for input(s): "VITAMINB12", "FOLATE", "FERRITIN", "TIBC", "IRON", "RETICCTPCT" in the last 72 hours. Urine  analysis:    Component Value Date/Time   COLORURINE YELLOW 08/26/2021 1706   APPEARANCEUR CLEAR 08/26/2021 1706   LABSPEC 1.007 08/26/2021 1706   PHURINE 5.0 08/26/2021 1706   GLUCOSEU >1,000 (A) 08/26/2021 1706   HGBUR NEGATIVE 08/26/2021 1706   BILIRUBINUR NEGATIVE 08/26/2021 1706   KETONESUR NEGATIVE 08/26/2021 1706   PROTEINUR NEGATIVE 08/26/2021 1706   NITRITE NEGATIVE 08/26/2021 1706   LEUKOCYTESUR NEGATIVE 08/26/2021 1706   Sepsis Labs: @LABRCNTIP (procalcitonin:4,lacticidven:4)  )No results found for this or any previous visit (from the past 240 hour(s)).   Radiology Studies: No results found.   Scheduled Meds:  aspirin EC  81 mg Oral Daily   DULoxetine  30 mg Oral QHS   enoxaparin (LOVENOX) injection  30 mg Subcutaneous Q24H   fenofibrate  160 mg Oral Daily   hydrALAZINE  10 mg Oral Q8H   insulin regular human CONCENTRATED  50 Units Subcutaneous QAC supper   insulin regular human CONCENTRATED  75 Units Subcutaneous BID WC   levothyroxine  125 mcg Oral Daily   pantoprazole  40 mg Oral Daily   pregabalin  150 mg Oral QHS   rosuvastatin  40 mg Oral Daily   sodium chloride flush  3 mL Intravenous Q12H   sodium chloride flush  3 mL Intravenous Q12H   Continuous Infusions:  sodium chloride 250 mL (12/06/21 1423)   milrinone 0.125 mcg/kg/min (12/09/21 0448)     LOS: 6 days    Time spent: 51mn  PDomenic Polite MD [facility]   12/09/2021, 10:07 AM

## 2021-12-10 ENCOUNTER — Other Ambulatory Visit (HOSPITAL_COMMUNITY): Payer: Self-pay

## 2021-12-10 ENCOUNTER — Telehealth (HOSPITAL_COMMUNITY): Payer: Self-pay

## 2021-12-10 DIAGNOSIS — I5023 Acute on chronic systolic (congestive) heart failure: Secondary | ICD-10-CM | POA: Diagnosis not present

## 2021-12-10 LAB — BASIC METABOLIC PANEL
Anion gap: 13 (ref 5–15)
BUN: 68 mg/dL — ABNORMAL HIGH (ref 6–20)
CO2: 23 mmol/L (ref 22–32)
Calcium: 10 mg/dL (ref 8.9–10.3)
Chloride: 100 mmol/L (ref 98–111)
Creatinine, Ser: 2.39 mg/dL — ABNORMAL HIGH (ref 0.44–1.00)
GFR, Estimated: 27 mL/min — ABNORMAL LOW (ref >60)
Glucose, Bld: 265 mg/dL — ABNORMAL HIGH (ref 70–99)
Potassium: 4.6 mmol/L (ref 3.5–5.1)
Sodium: 136 mmol/L (ref 135–145)

## 2021-12-10 LAB — C4 COMPLEMENT: Complement C4, Body Fluid: 39 mg/dL — ABNORMAL HIGH (ref 12–38)

## 2021-12-10 LAB — GLUCOSE, CAPILLARY
Glucose-Capillary: 274 mg/dL — ABNORMAL HIGH (ref 70–99)
Glucose-Capillary: 277 mg/dL — ABNORMAL HIGH (ref 70–99)
Glucose-Capillary: 329 mg/dL — ABNORMAL HIGH (ref 70–99)
Glucose-Capillary: 382 mg/dL — ABNORMAL HIGH (ref 70–99)
Glucose-Capillary: 389 mg/dL — ABNORMAL HIGH (ref 70–99)
Glucose-Capillary: 434 mg/dL — ABNORMAL HIGH (ref 70–99)

## 2021-12-10 LAB — LUPUS ANTICOAGULANT
DRVVT: 54.7 s — ABNORMAL HIGH (ref 0.0–47.0)
PTT Lupus Anticoagulant: 41.7 s (ref 0.0–43.5)
Thrombin Time: 19.9 s (ref 0.0–23.0)
dPT Confirm Ratio: 1.27 Ratio (ref 0.00–1.34)
dPT: 43.7 s (ref 0.0–47.6)

## 2021-12-10 LAB — DRVVT MIX: dRVVT Mix: 48.1 s — ABNORMAL HIGH (ref 0.0–40.4)

## 2021-12-10 LAB — RHEUMATOID FACTOR: Rheumatoid fact SerPl-aCnc: 12.6 IU/mL (ref <14.0)

## 2021-12-10 LAB — C3 COMPLEMENT: C3 Complement: 194 mg/dL — ABNORMAL HIGH (ref 82–167)

## 2021-12-10 LAB — DRVVT CONFIRM: dRVVT Confirm: 1.2 ratio (ref 0.8–1.2)

## 2021-12-10 MED ORDER — INSULIN REGULAR HUMAN (CONC) 500 UNIT/ML ~~LOC~~ SOPN
75.0000 [IU] | PEN_INJECTOR | Freq: Two times a day (BID) | SUBCUTANEOUS | Status: DC
  Administered 2021-12-10: 75 [IU] via SUBCUTANEOUS
  Filled 2021-12-10: qty 3

## 2021-12-10 MED ORDER — INSULIN REGULAR HUMAN (CONC) 500 UNIT/ML ~~LOC~~ SOPN
50.0000 [IU] | PEN_INJECTOR | Freq: Every day | SUBCUTANEOUS | Status: DC
  Filled 2021-12-10: qty 3

## 2021-12-10 MED ORDER — INSULIN REGULAR HUMAN (CONC) 500 UNIT/ML ~~LOC~~ SOPN
100.0000 [IU] | PEN_INJECTOR | SUBCUTANEOUS | Status: DC
  Administered 2021-12-10: 100 [IU] via SUBCUTANEOUS
  Administered 2021-12-11: 50 [IU] via SUBCUTANEOUS
  Administered 2021-12-11 – 2021-12-12 (×3): 100 [IU] via SUBCUTANEOUS
  Filled 2021-12-10: qty 3

## 2021-12-10 MED ORDER — SILDENAFIL CITRATE 20 MG PO TABS
10.0000 mg | ORAL_TABLET | Freq: Three times a day (TID) | ORAL | 0 refills | Status: DC
  Filled 2021-12-10: qty 10, 7d supply, fill #0

## 2021-12-10 MED ORDER — INSULIN REGULAR HUMAN (CONC) 500 UNIT/ML ~~LOC~~ SOPN
50.0000 [IU] | PEN_INJECTOR | SUBCUTANEOUS | Status: DC
  Administered 2021-12-10 – 2021-12-11 (×2): 50 [IU] via SUBCUTANEOUS
  Filled 2021-12-10: qty 3

## 2021-12-10 MED ORDER — HYDRALAZINE HCL 25 MG PO TABS
25.0000 mg | ORAL_TABLET | Freq: Three times a day (TID) | ORAL | 3 refills | Status: DC
  Filled 2021-12-10: qty 30, 10d supply, fill #0

## 2021-12-10 MED ORDER — HYDRALAZINE HCL 25 MG PO TABS
25.0000 mg | ORAL_TABLET | Freq: Three times a day (TID) | ORAL | Status: DC
  Administered 2021-12-10 – 2021-12-12 (×7): 25 mg via ORAL
  Filled 2021-12-10 (×7): qty 1

## 2021-12-10 MED ORDER — SILDENAFIL CITRATE 20 MG PO TABS
10.0000 mg | ORAL_TABLET | Freq: Three times a day (TID) | ORAL | Status: DC
  Administered 2021-12-10 – 2021-12-12 (×6): 10 mg via ORAL
  Filled 2021-12-10 (×7): qty 1

## 2021-12-10 MED ORDER — INSULIN REGULAR HUMAN (CONC) 500 UNIT/ML ~~LOC~~ SOPN
75.0000 [IU] | PEN_INJECTOR | SUBCUTANEOUS | Status: DC
  Filled 2021-12-10: qty 3

## 2021-12-10 MED ORDER — SILDENAFIL CITRATE 20 MG PO TABS
10.0000 mg | ORAL_TABLET | Freq: Three times a day (TID) | ORAL | Status: DC
  Administered 2021-12-10 (×2): 10 mg via ORAL
  Filled 2021-12-10 (×3): qty 1

## 2021-12-10 NOTE — Progress Notes (Addendum)
Advanced Heart Failure Rounding Note   Subjective:   09/11: S/p cardiomems placement. Started on milrinone 0.125 d/t RV failure.  RHC: RA 15 PA 37/23 (28) PCWP 16 Fick CO/CI 4.2/2.6 Thermo CO/CI 3.7/2.3 PVR 4.3 PA sat 51%, 54% PAPi 0.93  Scr back at baseline, 3.3>2.29>2.38>2.39  44m Torsemide yesterday. -1.5L UOP, continue 49mdaily, doesn't watch how much she drinks.  Hypotension resolved, BP now creeping up.   Feeling much better today and ready to do home. Minimal CP today, described same as yesterday but much improved. Denies SOB.   Objective:   Weight Range:  Vital Signs:   Temp:  [98.1 F (36.7 C)-98.3 F (36.8 C)] 98.2 F (36.8 C) (09/15 0749) Pulse Rate:  [103-139] 139 (09/15 0749) Resp:  [18-20] 18 (09/15 0749) BP: (118-149)/(66-94) 141/94 (09/15 0749) SpO2:  [95 %-98 %] 97 % (09/15 0749) Weight:  [67 kg] 67 kg (09/15 0554) Last BM Date : 12/05/21  Weight change: Filed Weights   12/07/21 0629 12/09/21 0446 12/10/21 0554  Weight: 67.7 kg 67.4 kg 67 kg    Intake/Output:   Intake/Output Summary (Last 24 hours) at 12/10/2021 0826 Last data filed at 12/10/2021 0553 Gross per 24 hour  Intake 606.46 ml  Output 1550 ml  Net -943.54 ml    Physical Exam: General:  ill appearing. Looks stated age. No respiratory difficulty HEENT: normal Neck: supple. JVD~7 cm. Carotids 2+ bilat; no bruits. No lymphadenopathy or thyromegaly appreciated. Cor: PMI nondisplaced. Regular rate & rhythm. No rubs, gallops or murmurs. Lungs: clear Abdomen: soft, nontender, nondistended. No hepatosplenomegaly. No bruits or masses. Good bowel sounds. Extremities: no cyanosis, clubbing, rash, edema  Neuro: alert & oriented x 3, cranial nerves grossly intact. moves all 4 extremities w/o difficulty. Affect pleasant.   Telemetry: ST 110s  Labs: Basic Metabolic Panel: Recent Labs  Lab 12/06/21 0343 12/06/21 0807 12/07/21 0414 12/08/21 0443 12/09/21 0236 12/10/21 0242  NA  131* 131*  132* 129* 137 136 136  K 4.6 4.6  4.5 4.5 3.9 3.6 4.6  CL 97*  --  95* 104 99 100  CO2 22  --  20* 21* 23 23  GLUCOSE 467*  --  341* 104* 190* 265*  BUN 62*  --  79* 73* 66* 68*  CREATININE 2.48*  --  3.33* 2.29* 2.38* 2.39*  CALCIUM 9.6  --  9.3 9.8 9.8 10.0    Liver Function Tests: No results for input(s): "AST", "ALT", "ALKPHOS", "BILITOT", "PROT", "ALBUMIN" in the last 168 hours. Recent Labs  Lab 12/09/21 0236  LIPASE 23   No results for input(s): "AMMONIA" in the last 168 hours.  CBC: Recent Labs  Lab 12/06/21 0343 12/06/21 0807 12/09/21 0236  WBC 5.6  --  5.0  HGB 10.5* 11.2*  11.2* 10.8*  HCT 31.2* 33.0*  33.0* 30.5*  MCV 86.2  --  84.5  PLT 137*  --  165    Cardiac Enzymes: No results for input(s): "CKTOTAL", "CKMB", "CKMBINDEX", "TROPONINI" in the last 168 hours.  BNP: BNP (last 3 results) Recent Labs    11/01/21 1142 11/12/21 1056 12/01/21 2011  BNP 17.9 27.8 41.3    ProBNP (last 3 results) No results for input(s): "PROBNP" in the last 8760 hours.   Other results:  Imaging: No results found.   Medications:     Scheduled Medications:  aspirin EC  81 mg Oral Daily   DULoxetine  30 mg Oral QHS   enoxaparin (LOVENOX) injection  30 mg Subcutaneous  Q24H   fenofibrate  160 mg Oral Daily   insulin regular human CONCENTRATED  50 Units Subcutaneous QAC supper   insulin regular human CONCENTRATED  75 Units Subcutaneous BID WC   isosorbide-hydrALAZINE  0.5 tablet Oral TID   ivabradine  5 mg Oral BID WC   levothyroxine  125 mcg Oral Daily   pantoprazole  40 mg Oral Daily   pregabalin  150 mg Oral QHS   rosuvastatin  40 mg Oral Daily   sodium chloride flush  3 mL Intravenous Q12H   sodium chloride flush  3 mL Intravenous Q12H   torsemide  40 mg Oral Daily    Infusions:  sodium chloride 250 mL (12/06/21 1423)   milrinone 0.125 mcg/kg/min (12/09/21 0448)    PRN Medications: sodium chloride, acetaminophen, albuterol,  dicyclomine, ondansetron (ZOFRAN) IV, oxyCODONE-acetaminophen, sodium chloride flush   Assessment/Plan:   Acute on Chronic Biventricular CHF with recovered EF - likely NICM. - Echo (1/22): EF 35-40% Mod MR - Echo (11/28/20): EF 20-25% with moderate RV dysfunction and mild septal flattening. Mod-severe TR - Rheum serologies negative, but Rheumatoid factor mildly elevated at 15.5 - cMRI (9/22): EF 29%c/w prior myocarditis vs sarcoid. May have delayed chemo cardiotoxcity.  - R/LHC with single vessel RCA occlusion, RA 1, LVEDP 17, Fick CO 5.6/CI 3.88. - Echo (10/22): EF 45-50%, grade II DD, RV ok. - Echo (12/22): EF 45%, RV normal  - RHC (5/23) with elevated filling pressures and normal CO, RA 12 (with prominent v-waves), RV 47/14, PA 48/25 (37), PCW 25 - Echo (5/23): EF 45-50%, RV normal  - Echo (9/23): EF 50-55%, RV okay, mild MR, mild to moderate TR - S/p cardiomems placement 12/06/21 - RHC 12/06/21: RA 15, PA 37/23 (28), PCWP 16, Fick CO/CI 4.2/2.6, Thermo CO/CI 3.7/2.3, PVR 4.3, PA sat 51%, 54%, PAPi 0.93 - V/Q scan negative - Will need outpatient sleep study - Chronically NYHA Class II-III, worse recently after holding diuretics and Entresto d/t AKI and hyperkalemia.  - Now admitted with primarily RV failure. Stopping milrinone 0.125 mcg today. Watch BP and Cr. Cr. at baseline.  - Continue Torsemide 40 daily - Holding Entresto and Coreg. BP and renal function now improving. Add back meds slowly if renal function stable.  - Ivabradine restarted yesterday - Stop Bidil 1/2 tab w/ start of sildenafil - Start 77m TID hydralizine - No BB with low-OP/ bad RV.  - Off Jardiance due to frequent GU infections.  - Off spiro w/ hyperkalemia.    2. CKD w/ recent AKI - suspect due to ATN -Scr baseline variable with multiple AKIs, typically 1.5-2.5 -Scr 2.5>2.7>2.5>3.33>2.29>2.38>2.39 -Suspect AKI in setting of diuretics and episodes of hypotension. -Daily Torsemide restarted yesterday. -1.5L.    3. CAD - Single vessel RCA occlusion (small co-dominant vessel) on cardiac cath 09/08. - Medical management.  - CP this admit worse with deep inspiration. Doubt ACS. Mild volume overload may be contributing - HS troponin minimally elevated with flat trend - Continue aspirin + statin.  4. Pulmonary hypertension - RHC 09/23: PA mean 28 and PVR 4.3 - Start sildenafil 13mTID   5. TR - Prominent v-waves in RA tracing on RHC 5/23. - Echo shows only mild to moderate TR    6. Severe hyperTG - Due to LPL deficiency w/ recent pancreatitis. - Continue fenofibrate  - Triglycerides down to 150 this admit - [facility]. [facility]   7. DM3c - Follows w/ endocrinology at [facility]. - Blood glucose uncontrolled. Management per primary team. - 2/2 chronic pancreatitis    8. Chronic abdominal swelling/bloating - Seen at [facility] (11/20) for IBS, per chart review. - Had penumatosis intestinalis. She was started on pancreatic enzyme replacement. - EGD (10/21): normal, bx taken to assess for celiac. - CT abdomen (2/22) and RUQ US showed no obvious gallbladder stones. - HIDA (4/22): ended early due to tachypnea, but cystic duct and gallbladder appeared WNL. - CT abdomen 12/22 unremarkable  - US abdomen 11/25/21: No ascites - Suspect RV failure may be playing a role  9. Poor Med Compliance - Continue paramedicine in the community, appreciate their assistance  Length of Stay: [place], AGACNP-BC  12/10/2021, 8:26 AM  Advanced Heart Failure Team Pager [phone] (M-F; 7a - 4p)  Please contact [facility] for night-coverage after hours (4p -7a ) and weekends on [email or web address]   Patient seen with NP, agree with the above note.   Subjective: Reports improvement in SOB; feels that she is back to her baseline. Otherwise no significant events overnight.    Exam: General: NAD HEENT: Normal.  Neck: Thick neck. No JVD, no thyromegaly or thyroid nodule.  Lungs: Clear to auscultation bilaterally with normal respiratory effort. CV: Nondisplaced PMI.  Heart regular S1/S2, no S3/S4, no murmur.  No peripheral edema.    Abdomen: Soft, nontender, no hepatosplenomegaly, no distention.  Skin: Intact without lesions or rashes.  Neurologic: Confused.  Psych: Normal affect. Extremities: No clubbing or cyanosis.    A/P Euvolemic on exam this AM, JVP down from time of RHC. Unfortunately, she will likely not be a satisfactory long term candidate on home milrinone for RV support. Will continue torsemide 54m daily and start sildenafil 158mTID (PVR 4.3, PAPi<1). Discontinuing ISDN & milrinone today. Discussed importance of fluid restriction and management of prn diuretics at length today.    Jasmine Mcbeth Advanced Heart Failure 1:58 PM

## 2021-12-10 NOTE — Inpatient Diabetes Management (Signed)
Inpatient Diabetes Program Recommendations  AACE/ADA: New Consensus Statement on Inpatient Glycemic Control (2015)  Target Ranges:  Prepandial:   less than 140 mg/dL      Peak postprandial:   less than 180 mg/dL (1-2 hours)      Critically ill patients:  140 - 180 mg/dL   Lab Results  Component Value Date   GLUCAP 382 (H) 12/10/2021   HGBA1C 7.9 (H) 08/15/2021    Review of Glycemic Control  Diabetes history: DM Type 3c (per [facility]) Outpatient Diabetes medications: U-500 270 units TID, 100 units at [facility]  Inpatient Diabetes Program Recommendations:   Noted U 500 insulin given later today due to patient ate breakfast late and dosing times adjusted. Please consider: -Add Novolog correction scale 0-15 units tid, 0-5 units hs  Thank you, Bethena Roys E. Jayden Kratochvil, RN, MSN, CDE  Diabetes Coordinator Inpatient Glycemic Control Team Team Pager [phone] (8am-5pm) 12/10/2021 12:03 PM

## 2021-12-10 NOTE — TOC Initial Note (Signed)
Transition of Care [facility]) - Initial/Assessment Note    Patient Details  Name: Jennifer [name] MRN: [id number] Date of Birth: 1991/03/11  Transition of Care [facility]) CM/SW Contact:    Erenest Rasher, RN Phone Number: [phone] 12/10/2021, 4:31 PM  Clinical Narrative:                 HF TOC CM spoke to pt and states she does have a scale in the home. Husband will assist with transportation. Explained her meds are in the [place] and she will receive prior to dc.   Expected Discharge Plan: Home/Self Care Barriers to Discharge: Continued Medical Work up   Patient Goals and CMS Choice Patient states their goals for this hospitalization and ongoing recovery are:: pt wants to remain independent      Expected Discharge Plan and Services Expected Discharge Plan: Home/Self Care In-house Referral: Clinical Social Work Discharge Planning Services: CM Consult Post Acute Care Choice: NA Living arrangements for the past 2 months: Apartment                   DME Agency: NA   Prior Living Arrangements/Services Living arrangements for the past 2 months: Apartment Lives with:: Spouse Patient language and need for interpreter reviewed:: Yes Do you feel safe going back to the place where you live?: Yes      Need for Family Participation in Patient Care: No (Comment) Care giver support system in place?: Yes (comment) Current home services: DME (previous scale provided to the patient.) Criminal Activity/Legal Involvement Pertinent to Current Situation/Hospitalization: No - Comment as needed  Activities of Daily Living Home Assistive Devices/Equipment: None ADL Screening (condition at time of admission) Patient's cognitive ability adequate to safely complete daily activities?: Yes Is the patient deaf or have difficulty hearing?: No Does the patient have difficulty seeing, even when wearing glasses/contacts?: Yes Does the patient have difficulty concentrating, remembering, or  making decisions?: No Patient able to express need for assistance with ADLs?: Yes Does the patient have difficulty dressing or bathing?: No Independently performs ADLs?: Yes (appropriate for developmental age) Does the patient have difficulty walking or climbing stairs?: No Weakness of Legs: None Weakness of Arms/Hands: None  Permission Sought/Granted Permission sought to share information with : Case Manager, Family Supports, PCP Permission granted to share information with : Yes, Verbal Permission Granted  Share Information with NAME: Shelle Iron     Permission granted to share info w Relationship: husband  Permission granted to share info w Contact Information: [phone]  Emotional Assessment Appearance:: Appears stated age Attitude/Demeanor/Rapport: Engaged Affect (typically observed): Accepting Orientation: : Oriented to Self, Oriented to Place, Oriented to  Time, Oriented to Situation Alcohol / Substance Use: Not Applicable Psych Involvement: No (comment)  Admission diagnosis:  Elevated troponin [R77.8] Chest pain [R07.9] Chest pain, unspecified type [R07.9] Heart failure ([place]) [I50.9] Patient Active Problem List   Diagnosis Date Noted   Heart failure ([place]) 12/03/2021   Acute on chronic systolic CHF (congestive heart failure) ([name]) 12/02/2021   Elevated troponin 12/02/2021   Pancytopenia ([place]) 12/02/2021   Amenorrhea 12/02/2021   Hyperosmolar hyperglycemic state (HHS) ([place]) 08/26/2021   Small intestinal bacterial overgrowth (SIBO) 08/26/2021   Lower extremity edema 08/26/2021   Hyperkalemia 08/14/2021   Hx of insulin dependent diabetes mellitus 08/14/2021   Acute kidney injury superimposed on chronic kidney disease ([place]) 05/13/2021   Chest pain 03/10/2021   Acute on chronic combined systolic and diastolic CHF (congestive heart failure) ([place]) 02/09/2021  History of astrocytoma of brain 02/09/2021   Severe hyperglycemia due to diabetes mellitus ([place]) 01/25/2021    Diarrhea    Elevated transaminase level    Acute combined systolic and diastolic heart failure (HCC)    Nonischemic cardiomyopathy ([place])    Acute decompensated heart failure (HCC)    Elevated liver enzymes    Acute renal failure superimposed on stage 3a chronic kidney disease ([place]) 11/26/2020   Abdominal pain 48/62/8241   Chronic systolic CHF (congestive heart failure) ([place]) 11/23/2020   Essential hypertension 11/23/2020   Hypertriglyceridemia 11/23/2020   Type 2 diabetes mellitus with hyperlipidemia ([place]) 10/10/2018   Hypothyroidism 10/10/2018   PCP:  Sue Lush, PA-C Pharmacy:   [id number] - [address] [place] [address] Phone: [phone] Fax: [phone]  [id number] [place] [place] [address]Phone: [phone]: [address] [id number]: [phone]: [phone]    Social Determinants of Health (SDOH) Interventions    Readmission Risk Interventions    12/07/2021    4:36 PM  Readmission Risk Prevention Plan  Transportation Screening Complete  Medication Review (RN Care Manager) Complete  PCP or Specialist appointment within 3-5 days of discharge Complete  [place]RI or [place]  SW Recovery Care/Counseling Consult Complete  [place]

## 2021-12-10 NOTE — Plan of Care (Signed)

## 2021-12-10 NOTE — Telephone Encounter (Signed)
Advanced Heart Failure Patient Advocate Encounter   Prior authorization is required for Sildenafil Citrate 20MG tablets.  Submitted: 12/10/2021. [place], CPhT Rx Patient Advocate Phone: [phone]

## 2021-12-10 NOTE — Progress Notes (Signed)
Patient had an episode of nausea nad left sided abdominal pain. I gave her some zofran and tylenol as requested by patient. She stated that the zofran helped but the abdominal pain on the left side which was sharp is a new pain and not her pancreatitis. I secure chatted Dr. Broadus John as the patient requested her to know. Will continue to monitor. Patient doesn't appear to be in acute distress with pain.

## 2021-12-10 NOTE — Progress Notes (Signed)
PROGRESS NOTE    Jennifer [name]  VOJ:[id number] DOB: 1990-04-22 DOA: 12/01/2021 PCP: Sue Lush, PA-C   31 yo female with history of astrocytoma sp resection and chemotherapy, chronic systolic CHF, history of BiV failure with recovered EF, CKD 3b, brittle type 2 diabetes presented with 3 days of chest pain.  And epigastric pain. -Recently prescribed Lokelma for hyperkalemia -Chest radiograph with no cardiomegaly and no infiltrates, no effusions -Noted to be volume overloaded, followed by advanced heart failure team, started on diuretics, hospitalization prolonged with worsening renal failure, underwent right heart cath on 9/11-wedge pressure was 16, PA was 37/23 CO/CI was 4.2/2.6 -9/11 started on IV milrinone -Also with significant brittle diabetes, extremes of hypo and hyperglycemia  Subjective: -Feels well, denies any complaints, intermittent epigastric discomfort -Tolerating diet, missed afternoon dose of insulin yesterday, CBGs now in the 3-400s  Assessment and Plan:  Acute on chronic systolic CHF RV failure -History of BiV failure with recovered EF -Last echo with EF of 50-55%, mild MR, mild to moderate TR -Right heart cath 9/11 noted PA pressures of 37/23, wedge pressure of 16 -VQ scan was negative on admission -Recently diuretics and Entresto were held for AKI/hyperkalemia, admitted with volume overload, primarily RV failure -9/11 started on milrinone, diuretics held with worsening AKI -Creatinine now stable, oral torsemide resumed -CardioMEMS placed on 9/11 -Entresto, Corlanor and Coreg on hold [name], history of UTIs, off Aldactone with hyperkalemia, could rechallenge -Plan to wean off milrinone  AKI on CKD3b -Worsening AKI with hypotension, likely ATN -Now able  Type 2 diabetes mellitus  Brittle diabetic with severe hyper and hypoglycemia, complicated by AKI, CKD 3B causing decreased insulin clearance -Restarted on U-500, she takes > 200 units for  breakfast and lunch and 100 units for dinner, further complicated by AKI and decreased renal clearance, will increase daytime dose  to 100 units and keep p.m. dose at 50, unfortunately missed 1 dose yesterday as she ate a very late lunch  Anemia of chronic disease Mild thrombocytopenia -Anemia panel 12/22 with mild iron deficiency -Hemoglobin relatively stable, check CBC in a.m.  History of astrocytoma of brain -follow up as outpatient.   Hypothyroidism Continue  levothyroxine   Severe hyperTG - Due to LPL deficiency, chronic pancreatitis -Continue fenofibrate -TG down to 150 this admission -Followed by [facility]ocr[facility]ology at [facility]  Chronic abdominal pain and symptoms -Likely secondary to chronic pancreatitis -Extensive GI notes from [place] reviewed, this has been suspected to be related to chronic pancreatitis from hypertriglyceridemia -No acute symptoms at this time, triglyceride down to 150 this week  DVT prophylaxis: Lovenox Code Status: Full code Family Communication: With patient detail, no family at bedside Disposition Plan: Home 1 to 2 days  Consultants: Advanced heart failure team   Procedures:   Antimicrobials:    Objective: Vitals:   12/10/21 0358 12/10/21 0554 12/10/21 0749 12/10/21 1154  BP: 126/66  (!) 141/94 (!) 147/88  Pulse: (!) 103  (!) 139 (!) 108  Resp: 19  18 18   Temp: 98.2 F (36.8 C)  98.2 F (36.8 C) 98.3 F (36.8 C)  TempSrc: Oral  Oral Oral  SpO2: 97%  97% 95%  Weight:  67 kg    Height:        Intake/Output Summary (Last 24 hours) at 12/10/2021 1219 Last data filed at 12/10/2021 0553 Gross per 24 hour  Intake 488.46 ml  Output 900 ml  Net -411.54 ml   Filed Weights   12/07/21 0629 12/09/21 0446 12/10/21 0554  Weight: 67.7 kg 67.4 kg 67 kg    Examination:  General exam: Chronic ill young female sitting up in bed, AAOx3, no distress Respiratory system: Clear to auscultation Cardiovascular system: S1 & S2 heard, RRR.  Abd: Mildly  distended, soft and nontender.Normal bowel sounds heard. Central nervous system: Alert and oriented. No focal neurological deficits. Extremities: no edema Skin: No rashes Psychiatry:  Mood & affect appropriate.     Data Reviewed:   CBC: Recent Labs  Lab 12/06/21 0343 12/06/21 0807 12/09/21 0236  WBC 5.6  --  5.0  HGB 10.5* 11.2*  11.2* 10.8*  HCT 31.2* 33.0*  33.0* 30.5*  MCV 86.2  --  84.5  PLT 137*  --  161   Basic Metabolic Panel: Recent Labs  Lab 12/06/21 0343 12/06/21 0807 12/07/21 0414 12/08/21 0443 12/09/21 0236 12/10/21 0242  NA 131* 131*  132* 129* 137 136 136  K 4.6 4.6  4.5 4.5 3.9 3.6 4.6  CL 97*  --  95* 104 99 100  CO2 22  --  20* 21* 23 23  GLUCOSE 467*  --  341* 104* 190* 265*  BUN 62*  --  79* 73* 66* 68*  CREATININE 2.48*  --  3.33* 2.29* 2.38* 2.39*  CALCIUM 9.6  --  9.3 9.8 9.8 10.0   GFR: Estimated Creatinine Clearance: 27.2 mL/min (A) (by C-G formula based on SCr of 2.39 mg/dL (H)). Liver Function Tests: No results for input(s): "AST", "ALT", "ALKPHOS", "BILITOT", "PROT", "ALBUMIN" in the last 168 hours. Recent Labs  Lab 12/09/21 0236  LIPASE 23   No results for input(s): "AMMONIA" in the last 168 hours. Coagulation Profile: No results for input(s): "INR", "PROTIME" in the last 168 hours. Cardiac Enzymes: No results for input(s): "CKTOTAL", "CKMB", "CKMBINDEX", "TROPONINI" in the last 168 hours. BNP (last 3 results) No results for input(s): "PROBNP" in the last 8760 hours. HbA1C: No results for input(s): "HGBA1C" in the last 72 hours. CBG: Recent Labs  Lab 12/09/21 2014 12/10/21 0002 12/10/21 0356 12/10/21 0747 12/10/21 1152  GLUCAP 322* 274* 277* 382* 434*   Lipid Profile: No results for input(s): "CHOL", "HDL", "LDLCALC", "TRIG", "CHOLHDL", "LDLDIRECT" in the last 72 hours. Thyroid Function Tests: No results for input(s): "TSH", "T4TOTAL", "FREET4", "T3FREE", "THYROIDAB" in the last 72 hours. Anemia Panel: No results  for input(s): "VITAMINB12", "FOLATE", "FERRITIN", "TIBC", "IRON", "RETICCTPCT" in the last 72 hours. Urine analysis:    Component Value Date/Time   COLORURINE YELLOW 08/26/2021 1706   APPEARANCEUR CLEAR 08/26/2021 1706   LABSPEC 1.007 08/26/2021 1706   PHURINE 5.0 08/26/2021 1706   GLUCOSEU >1,000 (A) 08/26/2021 1706   HGBUR NEGATIVE 08/26/2021 1706   BILIRUBINUR NEGATIVE 08/26/2021 1706   KETONESUR NEGATIVE 08/26/2021 1706   PROTEINUR NEGATIVE 08/26/2021 1706   NITRITE NEGATIVE 08/26/2021 1706   LEUKOCYTESUR NEGATIVE 08/26/2021 1706   Sepsis Labs: @LABRCNTIP (procalcitonin:4,lacticidven:4)  )No results found for this or any previous visit (from the past 240 hour(s)).   Radiology Studies: No results found.   Scheduled Meds:  aspirin EC  81 mg Oral Daily   DULoxetine  30 mg Oral QHS   enoxaparin (LOVENOX) injection  30 mg Subcutaneous Q24H   fenofibrate  160 mg Oral Daily   hydrALAZINE  25 mg Oral Q8H   insulin regular human CONCENTRATED  100 Units Subcutaneous 2 times per day   insulin regular human CONCENTRATED  50 Units Subcutaneous Daily   ivabradine  5 mg Oral BID WC   levothyroxine  125 mcg  Oral Daily   pantoprazole  40 mg Oral Daily   pregabalin  150 mg Oral QHS   rosuvastatin  40 mg Oral Daily   sildenafil  10 mg Oral TID   sodium chloride flush  3 mL Intravenous Q12H   sodium chloride flush  3 mL Intravenous Q12H   torsemide  40 mg Oral Daily   Continuous Infusions:  sodium chloride 250 mL (12/06/21 1423)   milrinone 0.125 mcg/kg/min (12/09/21 0448)     LOS: 7 days    Time spent: 38mn  PDomenic Polite MD [facility]   12/10/2021, 12:19 PM

## 2021-12-11 DIAGNOSIS — I5023 Acute on chronic systolic (congestive) heart failure: Secondary | ICD-10-CM | POA: Diagnosis not present

## 2021-12-11 LAB — GLUCOSE, CAPILLARY
Glucose-Capillary: 106 mg/dL — ABNORMAL HIGH (ref 70–99)
Glucose-Capillary: 117 mg/dL — ABNORMAL HIGH (ref 70–99)
Glucose-Capillary: 118 mg/dL — ABNORMAL HIGH (ref 70–99)
Glucose-Capillary: 145 mg/dL — ABNORMAL HIGH (ref 70–99)
Glucose-Capillary: 210 mg/dL — ABNORMAL HIGH (ref 70–99)
Glucose-Capillary: 231 mg/dL — ABNORMAL HIGH (ref 70–99)
Glucose-Capillary: 267 mg/dL — ABNORMAL HIGH (ref 70–99)

## 2021-12-11 LAB — BASIC METABOLIC PANEL
Anion gap: 16 — ABNORMAL HIGH (ref 5–15)
BUN: 82 mg/dL — ABNORMAL HIGH (ref 6–20)
CO2: 23 mmol/L (ref 22–32)
Calcium: 9.7 mg/dL (ref 8.9–10.3)
Chloride: 96 mmol/L — ABNORMAL LOW (ref 98–111)
Creatinine, Ser: 3.23 mg/dL — ABNORMAL HIGH (ref 0.44–1.00)
GFR, Estimated: 19 mL/min — ABNORMAL LOW (ref >60)
Glucose, Bld: 92 mg/dL (ref 70–99)
Potassium: 4 mmol/L (ref 3.5–5.1)
Sodium: 135 mmol/L (ref 135–145)

## 2021-12-11 NOTE — Progress Notes (Signed)
Advanced Heart Failure Rounding Note   Subjective:   09/11: S/p cardiomems placement. Started on milrinone 0.125 d/t RV failure.  RHC: RA 15 PA 37/23 (28) PCWP 16 Fick CO/CI 4.2/2.6 Thermo CO/CI 3.7/2.3 PVR 4.3 PA sat 51%, 54% PAPi 0.93  Milrinone stopped today. SCr 2.39 -> 3.23  Weight down 1 pounds. Torsemide held this am. Denies CP, SOB, orthopnea or PND.   SBP 130-140 range  Objective:   Weight Range:  Vital Signs:   Temp:  [98 F (36.7 C)-98.4 F (36.9 C)] 98 F (36.7 C) (09/16 0903) Pulse Rate:  [82-99] 94 (09/16 0903) Resp:  [16-18] 16 (09/16 0903) BP: (110-145)/(56-90) 145/90 (09/16 0903) SpO2:  [97 %-99 %] 99 % (09/16 0903) Weight:  [66.4 kg] 66.4 kg (09/16 0409) Last BM Date : 12/09/21  Weight change: Filed Weights   12/09/21 0446 12/10/21 0554 12/11/21 0409  Weight: 67.4 kg 67 kg 66.4 kg    Intake/Output:   Intake/Output Summary (Last 24 hours) at 12/11/2021 1343 Last data filed at 12/10/2021 2230 Gross per 24 hour  Intake 240 ml  Output 1000 ml  Net -760 ml     Physical Exam: General:  Lying in bed  No resp difficulty HEENT: normal Neck: supple. no obvious JVD. Carotids 2+ bilat; no bruits. No lymphadenopathy or thryomegaly appreciated. Cor: PMI nondisplaced. Regular rate & rhythm. 2/6 TR Lungs: clear Abdomen: obese soft, nontender, nondistended. No hepatosplenomegaly. No bruits or masses. Good bowel sounds. Extremities: no cyanosis, clubbing, rash, edema Neuro: alert & orientedx3, cranial nerves grossly intact. moves all 4 extremities w/o difficulty. Affect pleasant  Telemetry: Sinus 90-105 Personally reviewed   Labs: Basic Metabolic Panel: Recent Labs  Lab 12/07/21 0414 12/08/21 0443 12/09/21 0236 12/10/21 0242 12/11/21 0231  NA 129* 137 136 136 135  K 4.5 3.9 3.6 4.6 4.0  CL 95* 104 99 100 96*  CO2 20* 21* 23 23 23   GLUCOSE 341* 104* 190* 265* 92  BUN 79* 73* 66* 68* 82*  CREATININE 3.33* 2.29* 2.38* 2.39* 3.23*   CALCIUM 9.3 9.8 9.8 10.0 9.7     Liver Function Tests: No results for input(s): "AST", "ALT", "ALKPHOS", "BILITOT", "PROT", "ALBUMIN" in the last 168 hours. Recent Labs  Lab 12/09/21 0236  LIPASE 23    No results for input(s): "AMMONIA" in the last 168 hours.  CBC: Recent Labs  Lab 12/06/21 0343 12/06/21 0807 12/09/21 0236  WBC 5.6  --  5.0  HGB 10.5* 11.2*  11.2* 10.8*  HCT 31.2* 33.0*  33.0* 30.5*  MCV 86.2  --  84.5  PLT 137*  --  165     Cardiac Enzymes: No results for input(s): "CKTOTAL", "CKMB", "CKMBINDEX", "TROPONINI" in the last 168 hours.  BNP: BNP (last 3 results) Recent Labs    11/01/21 1142 11/12/21 1056 12/01/21 2011  BNP 17.9 27.8 41.3     ProBNP (last 3 results) No results for input(s): "PROBNP" in the last 8760 hours.   Other results:  Imaging: No results found.   Medications:     Scheduled Medications:  aspirin EC  81 mg Oral Daily   DULoxetine  30 mg Oral QHS   enoxaparin (LOVENOX) injection  30 mg Subcutaneous Q24H   fenofibrate  160 mg Oral Daily   hydrALAZINE  25 mg Oral Q8H   insulin regular human CONCENTRATED  100 Units Subcutaneous 2 times per day   insulin regular human CONCENTRATED  50 Units Subcutaneous Daily   ivabradine  5 mg  Oral BID WC   levothyroxine  125 mcg Oral Daily   pantoprazole  40 mg Oral Daily   pregabalin  150 mg Oral QHS   rosuvastatin  40 mg Oral Daily   sildenafil  10 mg Oral TID   sodium chloride flush  3 mL Intravenous Q12H   sodium chloride flush  3 mL Intravenous Q12H   torsemide  40 mg Oral Daily    Infusions:  sodium chloride 250 mL (12/06/21 1423)    PRN Medications: sodium chloride, acetaminophen, albuterol, dicyclomine, ondansetron (ZOFRAN) IV, oxyCODONE-acetaminophen, sodium chloride flush   Assessment/Plan:   Acute on Chronic Biventricular CHF with recovered EF - likely NICM. - Echo (1/22): EF 35-40% Mod MR - Echo (11/28/20): EF 20-25% with moderate RV dysfunction and mild  septal flattening. Mod-severe TR - Rheum serologies negative, but Rheumatoid factor mildly elevated at 15.5 - cMRI (9/22): EF 29%c/w prior myocarditis vs sarcoid. May have delayed chemo cardiotoxcity.  - R/LHC with single vessel RCA occlusion, RA 1, LVEDP 17, Fick CO 5.6/CI 3.88. - Echo (10/22): EF 45-50%, grade II DD, RV ok. - Echo (12/22): EF 45%, RV normal  - RHC (5/23) with elevated filling pressures and normal CO, RA 12 (with prominent v-waves), RV 47/14, PA 48/25 (37), PCW 25 - Echo (5/23): EF 45-50%, RV normal  - Echo (9/23): EF 50-55%, RV okay, mild MR, mild to moderate TR - S/p cardiomems placement 12/06/21 - RHC 12/06/21: RA 15, PA 37/23 (28), PCWP 16, Fick CO/CI 4.2/2.6, Thermo CO/CI 3.7/2.3, PVR 4.3, PA sat 51%, 54%, PAPi 0.93 - V/Q scan negative - Will need outpatient sleep study - Chronically NYHA Class II-III, worse recently after holding diuretics and Entresto d/t AKI and hyperkalemia.  - Now admitted with primarily RV failure. - Milrinone stopped yesterday. Scr worse today  - Will not restart milrinone at this point. Agree with holding diuretics and following Scr. Avoid hypotension Options limited - Holding Entresto and Coreg.  - Bidil switched to hydralazine with addition of sildenafil  - Continue ivabradine - No BB with low-OP/ bad RV.  - Off Jardiance due to frequent GU infections.  - Off spiro w/ hyperkalemia.    2. CKD w/ recent AKI - suspect due to ATN/cardiorenal -Scr baseline variable with multiple AKIs, typically 1.5-2.5 -Scr 2.5>2.7>2.5>3.33>2.29>2.38>2.39> 3.3 - Hold torsemide  - Renal u/s normal 5/23 - May require HD in near future to manage volume status   3. CAD - Single vessel RCA occlusion (small co-dominant vessel) on cardiac cath 09/08. - Medical management.  -No current angina  - Continue aspirin + statin.  4. Pulmonary hypertension - RHC 09/23: PA mean 28 and PVR 4.3 - Continue sildenafil 68m TID - Suspect WHO Group III OSA/OHS -> sats 84%  when I walked in room and she was sleeping - VQ negative - Will need sleep study   5. TR - Prominent v-waves in RA tracing on RHC 5/23. - Echo showsmild to moderate TR    6. Severe hyperTG - Due to LPL deficiency w/ recent pancreatitis. - Continue fenofibrate  - Triglycerides down to 150 this admit - [facility]. [facility]   7. DM3c - [facility] Blood glucose uncontrolled. Management per primary team. - 2/2 chronic pancreatitis    8. Chronic abdominal swelling/bloating - Seen at [facility] (11/20) for IBS, per chart review. - Had penumatosis intestinalis. She was  started on pancreatic enzyme replacement. - EGD (10/21): normal, bx taken to assess for celiac. - CT abdomen (2/22) and RUQ US showed no obvious gallbladder stones. - HIDA (4/22): ended early due to tachypnea, but cystic duct and gallbladder appeared WNL. - CT abdomen 12/22 unremarkable  - US abdomen 11/25/21: No ascites - Suspect RV failure may be playing a role  9. Poor Med Compliance - Continue paramedicine in the community, appreciate their assistance  Length of Stay: 8   Glori Bickers, MD  1:49 PM   Advanced Heart Failure Team Pager [phone] (M-F; 7a - 4p)  Please contact [facility] for night-coverage after hours (4p -7a ) and weekends on [email or web address]

## 2021-12-11 NOTE — Plan of Care (Signed)

## 2021-12-11 NOTE — Progress Notes (Addendum)
PROGRESS NOTE    Jennifer [name]  UXN:[id number] DOB: 01/05/1991 DOA: 12/01/2021 PCP: Sue Lush, PA-C   31 yo female with history of astrocytoma sp resection and chemotherapy, chronic systolic CHF, history of BiV failure with recovered EF, CKD 3b, brittle type 2 diabetes presented with 3 days of chest pain.  And epigastric pain. -Recently prescribed Lokelma for hyperkalemia -Chest radiograph with no cardiomegaly and no infiltrates, no effusions -Noted to be volume overloaded, followed by advanced heart failure team, started on diuretics, hospitalization prolonged with worsening renal failure, underwent right heart cath on 9/11-wedge pressure was 16, PA was 37/23 CO/CI was 4.2/2.6 -9/11 started on IV milrinone -Also with significant brittle diabetes, extremes of hypo and hyperglycemia -9/15, milrinone discontinued -9/16, creatinine back up to 3.2  Subjective: -Feels well, denies any complaints, denies any dyspnea or epigastric discomfort this morning, CBGs improving  Assessment and Plan:  Acute on chronic systolic CHF RV failure -History of BiV failure with recovered EF -Last echo with EF of 50-55%, mild MR, mild to moderate TR -Right heart cath 9/11 noted PA pressures of 37/23, wedge pressure of 16, VQ scan was negative on admission -Cardio mems placed 9/11 -Diuresed with IV Lasix initially, she is 9.5 L negative -9/11 started on milrinone, discontinued 9/15 -Creatinine was improving, now trended back up, asked RN to hold torsemide until seen by cardiology this morning, hopefully does not need to go back on milrinone -Entresto, Corlanor and Coreg on hold, started low-dose sildenafil yesterday -Off Jardiance, history of UTIs, off Aldactone with hyperkalemia, -Per heart failure team  AKI on CKD3b -Cardiorenal, earlier this admission developed worsening AKI with hypotension, was improving on milrinone, now back up to 3.2, see discussion above  Type 2 diabetes mellitus  Brittle  diabetic with severe hyper and hypoglycemia, complicated by AKI, CKD 3B causing decreased insulin clearance -Restarted on U-500, she takes > 200 units for breakfast and lunch and 100 units for dinner, further complicated by AKI and decreased renal clearance, had increased U-500 to 100 units a.m. and afternoon, 50 units at bedtime, will monitor CBGs today, may need to decrease dose with AKI  Anemia of chronic disease Mild thrombocytopenia -Anemia panel 12/22 with mild iron deficiency -Hemoglobin relatively stable, check CBC in a.m.  History of astrocytoma of brain -follow up as outpatient.   Hypothyroidism Continue  levothyroxine   Severe hyperTG - Due to LPL deficiency, chronic pancreatitis -Continue fenofibrate -TG down to 150 this admission -Followed by endocrinology at [facility]  Chronic abdominal pain and symptoms -Likely secondary to chronic pancreatitis -Extensive GI notes from [place] reviewed, this has been suspected to be related to chronic pancreatitis from hypertriglyceridemia -No acute symptoms at this time, triglyceride down to 150 this week  DVT prophylaxis: Lovenox Code Status: Full code Family Communication: With patient detail, no family at bedside Disposition Plan: Home pending improvement in kidney function, CHF  Consultants: Advanced heart failure team   Procedures:   Antimicrobials:    Objective: Vitals:   12/11/21 0023 12/11/21 0409 12/11/21 0745 12/11/21 0903  BP: (!) 110/56 129/78 136/80 (!) 145/90  Pulse: 85 95 99 94  Resp: 18 18 18 16   Temp: 98.2 F (36.8 C) 98.4 F (36.9 C) 98.3 F (36.8 C) 98 F (36.7 C)  TempSrc: Oral Oral Oral Oral  SpO2: 97% 97% 98% 99%  Weight:  66.4 kg    Height:        Intake/Output Summary (Last 24 hours) at 12/11/2021 1114 Last data filed at 12/10/2021  2230 Gross per 24 hour  Intake 240 ml  Output 2000 ml  Net -1760 ml   Filed Weights   12/09/21 0446 12/10/21 0554 12/11/21 0409  Weight: 67.4 kg 67 kg 66.4 kg     Examination:  General exam: Chronic ill young female sitting up in bed, AAOx3, no distress Respiratory system: Clear to auscultation Cardiovascular system: S1 & S2 heard, RRR.  Abd: Mildly distended, soft and nontender.Normal bowel sounds heard. Central nervous system: Alert and oriented. No focal neurological deficits. Extremities: no edema Skin: No rashes Psychiatry:  Mood & affect appropriate.     Data Reviewed:   CBC: Recent Labs  Lab 12/06/21 0343 12/06/21 0807 12/09/21 0236  WBC 5.6  --  5.0  HGB 10.5* 11.2*  11.2* 10.8*  HCT 31.2* 33.0*  33.0* 30.5*  MCV 86.2  --  84.5  PLT 137*  --  027   Basic Metabolic Panel: Recent Labs  Lab 12/07/21 0414 12/08/21 0443 12/09/21 0236 12/10/21 0242 12/11/21 0231  NA 129* 137 136 136 135  K 4.5 3.9 3.6 4.6 4.0  CL 95* 104 99 100 96*  CO2 20* 21* 23 23 23   GLUCOSE 341* 104* 190* 265* 92  BUN 79* 73* 66* 68* 82*  CREATININE 3.33* 2.29* 2.38* 2.39* 3.23*  CALCIUM 9.3 9.8 9.8 10.0 9.7   GFR: Estimated Creatinine Clearance: 20 mL/min (A) (by C-G formula based on SCr of 3.23 mg/dL (H)). Liver Function Tests: No results for input(s): "AST", "ALT", "ALKPHOS", "BILITOT", "PROT", "ALBUMIN" in the last 168 hours. Recent Labs  Lab 12/09/21 0236  LIPASE 23   No results for input(s): "AMMONIA" in the last 168 hours. Coagulation Profile: No results for input(s): "INR", "PROTIME" in the last 168 hours. Cardiac Enzymes: No results for input(s): "CKTOTAL", "CKMB", "CKMBINDEX", "TROPONINI" in the last 168 hours. BNP (last 3 results) No results for input(s): "PROBNP" in the last 8760 hours. HbA1C: No results for input(s): "HGBA1C" in the last 72 hours. CBG: Recent Labs  Lab 12/10/21 1624 12/10/21 1942 12/11/21 0020 12/11/21 0406 12/11/21 0748  GLUCAP 389* 329* 118* 117* 267*   Lipid Profile: No results for input(s): "CHOL", "HDL", "LDLCALC", "TRIG", "CHOLHDL", "LDLDIRECT" in the last 72 hours. Thyroid Function  Tests: No results for input(s): "TSH", "T4TOTAL", "FREET4", "T3FREE", "THYROIDAB" in the last 72 hours. Anemia Panel: No results for input(s): "VITAMINB12", "FOLATE", "FERRITIN", "TIBC", "IRON", "RETICCTPCT" in the last 72 hours. Urine analysis:    Component Value Date/Time   COLORURINE YELLOW 08/26/2021 1706   APPEARANCEUR CLEAR 08/26/2021 1706   LABSPEC 1.007 08/26/2021 1706   PHURINE 5.0 08/26/2021 1706   GLUCOSEU >1,000 (A) 08/26/2021 1706   HGBUR NEGATIVE 08/26/2021 1706   BILIRUBINUR NEGATIVE 08/26/2021 1706   KETONESUR NEGATIVE 08/26/2021 1706   PROTEINUR NEGATIVE 08/26/2021 1706   NITRITE NEGATIVE 08/26/2021 1706   LEUKOCYTESUR NEGATIVE 08/26/2021 1706   Sepsis Labs: @LABRCNTIP (procalcitonin:4,lacticidven:4)  )No results found for this or any previous visit (from the past 240 hour(s)).   Radiology Studies: No results found.   Scheduled Meds:  aspirin EC  81 mg Oral Daily   DULoxetine  30 mg Oral QHS   enoxaparin (LOVENOX) injection  30 mg Subcutaneous Q24H   fenofibrate  160 mg Oral Daily   hydrALAZINE  25 mg Oral Q8H   insulin regular human CONCENTRATED  100 Units Subcutaneous 2 times per day   insulin regular human CONCENTRATED  50 Units Subcutaneous Daily   ivabradine  5 mg Oral BID WC  levothyroxine  125 mcg Oral Daily   pantoprazole  40 mg Oral Daily   pregabalin  150 mg Oral QHS   rosuvastatin  40 mg Oral Daily   sildenafil  10 mg Oral TID   sodium chloride flush  3 mL Intravenous Q12H   sodium chloride flush  3 mL Intravenous Q12H   torsemide  40 mg Oral Daily   Continuous Infusions:  sodium chloride 250 mL (12/06/21 1423)     LOS: 8 days    Time spent: 73mn  PDomenic Polite MD [facility]   12/11/2021, 11:14 AM

## 2021-12-12 DIAGNOSIS — I5023 Acute on chronic systolic (congestive) heart failure: Secondary | ICD-10-CM | POA: Diagnosis not present

## 2021-12-12 LAB — BASIC METABOLIC PANEL
Anion gap: 19 — ABNORMAL HIGH (ref 5–15)
Anion gap: 14 (ref 5–15)
BUN: 87 mg/dL — ABNORMAL HIGH (ref 6–20)
BUN: 77 mg/dL — ABNORMAL HIGH (ref 6–20)
CO2: 20 mmol/L — ABNORMAL LOW (ref 22–32)
CO2: 24 mmol/L (ref 22–32)
Calcium: 9.8 mg/dL (ref 8.9–10.3)
Calcium: 9.9 mg/dL (ref 8.9–10.3)
Chloride: 97 mmol/L — ABNORMAL LOW (ref 98–111)
Chloride: 97 mmol/L — ABNORMAL LOW (ref 98–111)
Creatinine, Ser: 2.9 mg/dL — ABNORMAL HIGH (ref 0.44–1.00)
Creatinine, Ser: 2.44 mg/dL — ABNORMAL HIGH (ref 0.44–1.00)
GFR, Estimated: 22 mL/min — ABNORMAL LOW (ref >60)
GFR, Estimated: 27 mL/min — ABNORMAL LOW (ref >60)
Glucose, Bld: 156 mg/dL — ABNORMAL HIGH (ref 70–99)
Glucose, Bld: 176 mg/dL — ABNORMAL HIGH (ref 70–99)
Potassium: 4.2 mmol/L (ref 3.5–5.1)
Potassium: 4.5 mmol/L (ref 3.5–5.1)
Sodium: 136 mmol/L (ref 135–145)
Sodium: 135 mmol/L (ref 135–145)

## 2021-12-12 LAB — GLUCOSE, CAPILLARY
Glucose-Capillary: 175 mg/dL — ABNORMAL HIGH (ref 70–99)
Glucose-Capillary: 187 mg/dL — ABNORMAL HIGH (ref 70–99)
Glucose-Capillary: 214 mg/dL — ABNORMAL HIGH (ref 70–99)
Glucose-Capillary: 218 mg/dL — ABNORMAL HIGH (ref 70–99)
Glucose-Capillary: 370 mg/dL — ABNORMAL HIGH (ref 70–99)

## 2021-12-12 MED ORDER — INSULIN REGULAR HUMAN (CONC) 500 UNIT/ML ~~LOC~~ SOLN: 50.0000 [IU] | SUBCUTANEOUS | 0 refills | Status: DC

## 2021-12-12 MED ORDER — SILDENAFIL CITRATE 20 MG PO TABS: 20.0000 mg | ORAL_TABLET | Freq: Three times a day (TID) | ORAL | 0 refills | Status: DC

## 2021-12-12 MED ORDER — ALPRAZOLAM 0.5 MG PO TABS: 0.5000 mg | ORAL_TABLET | Freq: Every day | ORAL | Status: DC | PRN

## 2021-12-12 MED ORDER — PREGABALIN 150 MG PO CAPS: 150.0000 mg | ORAL_CAPSULE | Freq: Every day | ORAL | Status: DC

## 2021-12-12 MED ORDER — TORSEMIDE 20 MG PO TABS: 40.0000 mg | ORAL_TABLET | Freq: Every day | ORAL | Status: DC

## 2021-12-12 NOTE — Progress Notes (Signed)
Patient discharged via wheelchair to private vehicle with husband. No further questions. Verbalized understanding.

## 2021-12-12 NOTE — Progress Notes (Signed)
Advanced Heart Failure Rounding Note   Subjective:   09/11: S/p cardiomems placement. Started on milrinone 0.125 d/t RV failure.  RHC: RA 15 PA 37/23 (28) PCWP 16 Fick CO/CI 4.2/2.6 Thermo CO/CI 3.7/2.3 PVR 4.3 PA sat 51%, 54% PAPi 0.93  Milrinone stopped 9/15. Torsemide held yesterday. SCr 2.39 -> 3.23 -> 2.90  Feels ok. Denies CP, SOB. Eager to go home  SBP 115-160 range. No hypotensions   Objective:   Weight Range:  Vital Signs:   Temp:  [98.1 F (36.7 C)-98.6 F (37 C)] 98.1 F (36.7 C) (09/17 0751) Pulse Rate:  [96-99] 97 (09/17 0751) Resp:  [16] 16 (09/17 0751) BP: (108-157)/(75-84) 116/75 (09/17 0751) SpO2:  [93 %-99 %] 98 % (09/17 0751) Weight:  [66.7 kg] 66.7 kg (09/17 0412) Last BM Date : 12/09/21  Weight change: Filed Weights   12/10/21 0554 12/11/21 0409 12/12/21 0412  Weight: 67 kg 66.4 kg 66.7 kg    Intake/Output:   Intake/Output Summary (Last 24 hours) at 12/12/2021 1130 Last data filed at 12/12/2021 1048 Gross per 24 hour  Intake 260 ml  Output 1000 ml  Net -740 ml     Physical Exam: General:  Lying flat in bed No resp difficulty HEENT: normal Neck: supple. JVP 8-9 Carotids 2+ bilat; no bruits. No lymphadenopathy or thryomegaly appreciated. Cor: PMI nondisplaced. Regular rate & rhythm. 2/6 TR Lungs: clear Abdomen: obese soft, nontender, nondistended. No hepatosplenomegaly. No bruits or masses. Good bowel sounds. Extremities: no cyanosis, clubbing, rash, edema Neuro: alert & orientedx3, cranial nerves grossly intact. moves all 4 extremities. Affect pleasant   Telemetry: Sinus 90-110  Personally reviewed   Labs: Basic Metabolic Panel: Recent Labs  Lab 12/08/21 0443 12/09/21 0236 12/10/21 0242 12/11/21 0231 12/12/21 0153  NA 137 136 136 135 136  K 3.9 3.6 4.6 4.0 4.2  CL 104 99 100 96* 97*  CO2 21* 23 23 23  20*  GLUCOSE 104* 190* 265* 92 156*  BUN 73* 66* 68* 82* 87*  CREATININE 2.29* 2.38* 2.39* 3.23* 2.90*  CALCIUM  9.8 9.8 10.0 9.7 9.8     Liver Function Tests: No results for input(s): "AST", "ALT", "ALKPHOS", "BILITOT", "PROT", "ALBUMIN" in the last 168 hours. Recent Labs  Lab 12/09/21 0236  LIPASE 23    No results for input(s): "AMMONIA" in the last 168 hours.  CBC: Recent Labs  Lab 12/06/21 0343 12/06/21 0807 12/09/21 0236  WBC 5.6  --  5.0  HGB 10.5* 11.2*  11.2* 10.8*  HCT 31.2* 33.0*  33.0* 30.5*  MCV 86.2  --  84.5  PLT 137*  --  165     Cardiac Enzymes: No results for input(s): "CKTOTAL", "CKMB", "CKMBINDEX", "TROPONINI" in the last 168 hours.  BNP: BNP (last 3 results) Recent Labs    11/01/21 1142 11/12/21 1056 12/01/21 2011  BNP 17.9 27.8 41.3     ProBNP (last 3 results) No results for input(s): "PROBNP" in the last 8760 hours.   Other results:  Imaging: No results found.   Medications:     Scheduled Medications:  aspirin EC  81 mg Oral Daily   DULoxetine  30 mg Oral QHS   enoxaparin (LOVENOX) injection  30 mg Subcutaneous Q24H   fenofibrate  160 mg Oral Daily   hydrALAZINE  25 mg Oral Q8H   insulin regular human CONCENTRATED  100 Units Subcutaneous 2 times per day   insulin regular human CONCENTRATED  50 Units Subcutaneous Daily   ivabradine  5  mg Oral BID WC   levothyroxine  125 mcg Oral Daily   pantoprazole  40 mg Oral Daily   pregabalin  150 mg Oral QHS   rosuvastatin  40 mg Oral Daily   sildenafil  10 mg Oral TID   sodium chloride flush  3 mL Intravenous Q12H   sodium chloride flush  3 mL Intravenous Q12H    Infusions:  sodium chloride 250 mL (12/06/21 1423)    PRN Medications: sodium chloride, acetaminophen, albuterol, dicyclomine, ondansetron (ZOFRAN) IV, oxyCODONE-acetaminophen, sodium chloride flush   Assessment/Plan:   Acute on Chronic Biventricular CHF with recovered EF - likely NICM. - Echo (1/22): EF 35-40% Mod MR - Echo (11/28/20): EF 20-25% with moderate RV dysfunction and mild septal flattening. Mod-severe TR -  Rheum serologies negative, but Rheumatoid factor mildly elevated at 15.5 - cMRI (9/22): EF 29%c/w prior myocarditis vs sarcoid. May have delayed chemo cardiotoxcity.  - R/LHC with single vessel RCA occlusion, RA 1, LVEDP 17, Fick CO 5.6/CI 3.88. - Echo (10/22): EF 45-50%, grade II DD, RV ok. - Echo (12/22): EF 45%, RV normal  - RHC (5/23) with elevated filling pressures and normal CO, RA 12 (with prominent v-waves), RV 47/14, PA 48/25 (37), PCW 25 - Echo (5/23): EF 45-50%, RV normal  - Echo (9/23): EF 50-55%, RV okay, mild MR, mild to moderate TR - S/p cardiomems placement 12/06/21 - RHC 12/06/21: RA 15, PA 37/23 (28), PCWP 16, Fick CO/CI 4.2/2.6, Thermo CO/CI 3.7/2.3, PVR 4.3, PA sat 51%, 54%, PAPi 0.93 - V/Q scan negative - Will need outpatient sleep study - Chronically NYHA Class II-III, worse recently after holding diuretics and Entresto d/t AKI and hyperkalemia.  - Now admitted with primarily RV failure. - Milrinone stopped  9/15 - Torsemide on hold due to AKI. Scr improved today - Holding Entresto and Coreg with AKI and RV failure - Bidil switched to hydralazine with addition of sildenafil  - Continue ivabradine - No BB with low-OP/ bad RV.  - Off Jardiance due to frequent GU infections.  - Off spiro w/ hyperkalemia.    2. CKD IV w/ recent AKI - suspect due to ATN/cardiorenal -Scr baseline variable with multiple AKIs, typically 1.5-2.5 -Scr 2.5>2.7>2.5>3.33>2.29>2.38>2.39> 3.3> 2.9 - Hold torsemide  - Renal u/s normal 5/23 - May require HD in near future to manage volume status   3. CAD - Single vessel RCA occlusion (small co-dominant vessel) on cardiac cath 09/08. - Medical management.  -No current angina  - Continue aspirin + statin.  4. Pulmonary hypertension - RHC 09/23: PA mean 28 and PVR 4.3 - Increase sildenafil to 20 mg TID - Suspect WHO Group III OSA/OHS -> sats drop into 80s while sleeping - VQ negative - Will need sleep study   5. TR - Prominent v-waves in  RA tracing on RHC 5/23. - Echo showsmild to moderate TR    6. Severe hyperTG - Due to LPL deficiency w/ recent pancreatitis. - Continue fenofibrate  - Triglycerides down to 150 this admit - Previously offered referral to [facility] for further management. She prefers to continue with her endocrinologist at [facility] who currently manages/follows.   7. DM3c - Follows w/ endocrinology at [facility]. - Blood glucose uncontrolled. Management per primary team. - 2/2 chronic pancreatitis    8. Chronic abdominal swelling/bloating - Seen at [facility] (11/20) for IBS, per chart review. - Had penumatosis intestinalis. She was started on pancreatic enzyme replacement. - EGD (10/21): normal, bx taken to assess for celiac. -  CT abdomen (2/22) and RUQ US showed no obvious gallbladder stones. - HIDA (4/22): ended early due to tachypnea, but cystic duct and gallbladder appeared WNL. - CT abdomen 12/22 unremarkable  - US abdomen 11/25/21: No ascites - Suspect RV failure may be playing a role  9. Poor Med Compliance - Continue paramedicine in the community, appreciate their assistance  She has tenuous RV failure in setting of CKD IV. Suspect volume management will remain difficult and she may eventually require dialysis to optimally manage. Will do our best with Cardiomems to optimize as outpatient.   Will check BMET now. If Scr and bicarb stable/improving -> OK for d/c today or tomorrow from my standpoint. Would hold torsemide today and restart tomorrow. D/w Dr. Broadus John.   HF Meds for d/c  Torsemide 40 daily Sildenafil 20 tid Ivabradine 7.5 tid Hydralazine 25 tid  Stop cavedilol. Use metolazone only as instructed by HF Clinic. (Hold for now)   Length of Stay: 9   Glori Bickers, MD  11:30 AM   Advanced Heart Failure Team Pager [phone] (M-F; 7a - 4p)  Please contact [facility] for night-coverage after hours (4p -7a ) and weekends on [email or web address]

## 2021-12-12 NOTE — Progress Notes (Addendum)
PROGRESS NOTE    Jennifer [name]  WRU:[id number] DOB: Mar 15, 1991 DOA: 12/01/2021 PCP: Sue Lush, PA-C   31 yo female with history of astrocytoma sp resection and chemotherapy, chronic systolic CHF, history of BiV failure with recovered EF, CKD 3b, brittle type 2 diabetes presented with 3 days of chest pain.  And epigastric pain. -Noted to be volume overloaded, followed by advanced heart failure team, started on diuretics, hospitalization prolonged with worsening renal failure, underwent right heart cath on 9/11-wedge pressure was 16, PA was 37/23 CO/CI was 4.2/2.6 -9/11 started on IV milrinone -Complicated by brittle diabetes, extremes of hypo and hyperglycemia -9/15, milrinone discontinued -9/16, creatinine back up to 3.2  Subjective: -Feels okay, denies any dyspnea, denies abdominal pain nausea or vomiting, ate breakfast this morning  Assessment and Plan:  Acute on chronic systolic CHF RV failure -History of BiV failure with recovered EF -Last echo with EF of 50-55%, mild MR, mild to moderate TR -Right heart cath 9/11 noted PA pressures of 37/23, wedge pressure of 16, VQ scan was negative on admission -Cardio mems placed 9/11 -Diuresed with IV Lasix initially, she is 9.5 L negative -9/11 started on milrinone, discontinued 9/15 -Creatinine trended back up to 3.2 yesterday, torsemide held, now 2.9, likely hold diuretics 1 more day -Entresto, Corlanor and Coreg on hold, started low-dose sildenafil  -Off Jardiance, history of UTIs, off Aldactone with hyperkalemia, -Per heart failure team -Hopefully home in 24 to 48 hours  AKI on CKD3b -Baseline creatinine 1.7-2.3 -Cardiorenal, earlier this admission developed worsening AKI with hypotension, was improving on milrinone, now back up to 3.2, see discussion above  Type 2 diabetes mellitus  Brittle diabetic with severe hyper and hypoglycemia,  -Restarted on U-500, she takes > 200 units for breakfast and lunch and 100 units for  dinner, further complicated by AKI and decreased renal clearance, had increased U-500 to 100 units a.m. and afternoon, 50 units at bedtime, stable on current regimen today, will depend on GFR  Anemia of chronic disease Mild thrombocytopenia -Anemia panel 12/22 with mild iron deficiency -Hemoglobin relatively stable, check CBC in a.m.  History of astrocytoma of brain -follow up as outpatient.   Hypothyroidism Continue  levothyroxine   Severe hyperTG - Due to LPL deficiency, chronic pancreatitis -Continue fenofibrate -TG down to 150 this admission -Followed [facility]ocrinology at [facility]  Chronic abdominal pain and symptoms -Likely secondary to chronic pancreatitis -Extensive GI notes from [place] reviewed, this has been suspected to be related to chronic pancreatitis from hypertriglyceridemia -No acute symptoms at this time, triglyceride down to 150 this week  DVT prophylaxis: Lovenox Code Status: Full code Family Communication: With patient detail, no family at bedside Disposition Plan: Home pending improvement in kidney function, CHF  Consultants: Advanced heart failure team   Procedures:   Antimicrobials:    Objective: Vitals:   12/11/21 2006 12/12/21 0006 12/12/21 0412 12/12/21 0751  BP: 130/80 (!) 157/77 (!) 151/84 116/75  Pulse:  99 97 97  Resp: 16 16 16 16   Temp: 98.6 F (37 C) 98.3 F (36.8 C) 98.3 F (36.8 C) 98.1 F (36.7 C)  TempSrc: Oral Oral Oral Oral  SpO2: 93% 98% 97% 98%  Weight:   66.7 kg   Height:        Intake/Output Summary (Last 24 hours) at 12/12/2021 1032 Last data filed at 12/12/2021 0836 Gross per 24 hour  Intake 260 ml  Output --  Net 260 ml   Filed Weights   12/10/21 0554 12/11/21 0409  12/12/21 0412  Weight: 67 kg 66.4 kg 66.7 kg    Examination:  General exam: Chronically ill young female sitting up in bed, AAOx3, no distress HEENT: Unable to assess JVD CVS: S1-S2, regular rate rhythm Lungs: Decreased breath sounds the  bases Abdomen: Soft, mildly distended, nontender, bowel sounds present Extremities: No edema  Skin: No rashes Psychiatry:  Mood & affect appropriate.     Data Reviewed:   CBC: Recent Labs  Lab 12/06/21 0343 12/06/21 0807 12/09/21 0236  WBC 5.6  --  5.0  HGB 10.5* 11.2*  11.2* 10.8*  HCT 31.2* 33.0*  33.0* 30.5*  MCV 86.2  --  84.5  PLT 137*  --  888   Basic Metabolic Panel: Recent Labs  Lab 12/08/21 0443 12/09/21 0236 12/10/21 0242 12/11/21 0231 12/12/21 0153  NA 137 136 136 135 136  K 3.9 3.6 4.6 4.0 4.2  CL 104 99 100 96* 97*  CO2 21* 23 23 23  20*  GLUCOSE 104* 190* 265* 92 156*  BUN 73* 66* 68* 82* 87*  CREATININE 2.29* 2.38* 2.39* 3.23* 2.90*  CALCIUM 9.8 9.8 10.0 9.7 9.8   GFR: Estimated Creatinine Clearance: 22.3 mL/min (A) (by C-G formula based on SCr of 2.9 mg/dL (H)). Liver Function Tests: No results for input(s): "AST", "ALT", "ALKPHOS", "BILITOT", "PROT", "ALBUMIN" in the last 168 hours. Recent Labs  Lab 12/09/21 0236  LIPASE 23   No results for input(s): "AMMONIA" in the last 168 hours. Coagulation Profile: No results for input(s): "INR", "PROTIME" in the last 168 hours. Cardiac Enzymes: No results for input(s): "CKTOTAL", "CKMB", "CKMBINDEX", "TROPONINI" in the last 168 hours. BNP (last 3 results) No results for input(s): "PROBNP" in the last 8760 hours. HbA1C: No results for input(s): "HGBA1C" in the last 72 hours. CBG: Recent Labs  Lab 12/11/21 1608 12/11/21 2010 12/12/21 0009 12/12/21 0420 12/12/21 0748  GLUCAP 210* 231* 187* 214* 175*   Lipid Profile: No results for input(s): "CHOL", "HDL", "LDLCALC", "TRIG", "CHOLHDL", "LDLDIRECT" in the last 72 hours. Thyroid Function Tests: No results for input(s): "TSH", "T4TOTAL", "FREET4", "T3FREE", "THYROIDAB" in the last 72 hours. Anemia Panel: No results for input(s): "VITAMINB12", "FOLATE", "FERRITIN", "TIBC", "IRON", "RETICCTPCT" in the last 72 hours. Urine analysis:    Component  Value Date/Time   COLORURINE YELLOW 08/26/2021 1706   APPEARANCEUR CLEAR 08/26/2021 1706   LABSPEC 1.007 08/26/2021 1706   PHURINE 5.0 08/26/2021 1706   GLUCOSEU >1,000 (A) 08/26/2021 1706   HGBUR NEGATIVE 08/26/2021 1706   BILIRUBINUR NEGATIVE 08/26/2021 1706   KETONESUR NEGATIVE 08/26/2021 1706   PROTEINUR NEGATIVE 08/26/2021 1706   NITRITE NEGATIVE 08/26/2021 1706   LEUKOCYTESUR NEGATIVE 08/26/2021 1706   Sepsis Labs: @LABRCNTIP (procalcitonin:4,lacticidven:4)  )No results found for this or any previous visit (from the past 240 hour(s)).   Radiology Studies: No results found.   Scheduled Meds:  aspirin EC  81 mg Oral Daily   DULoxetine  30 mg Oral QHS   enoxaparin (LOVENOX) injection  30 mg Subcutaneous Q24H   fenofibrate  160 mg Oral Daily   hydrALAZINE  25 mg Oral Q8H   insulin regular human CONCENTRATED  100 Units Subcutaneous 2 times per day   insulin regular human CONCENTRATED  50 Units Subcutaneous Daily   ivabradine  5 mg Oral BID WC   levothyroxine  125 mcg Oral Daily   pantoprazole  40 mg Oral Daily   pregabalin  150 mg Oral QHS   rosuvastatin  40 mg Oral Daily   sildenafil  10 mg Oral TID   sodium chloride flush  3 mL Intravenous Q12H   sodium chloride flush  3 mL Intravenous Q12H   Continuous Infusions:  sodium chloride 250 mL (12/06/21 1423)     LOS: 9 days    Time spent: 25mn  PDomenic Polite MD [facility]   12/12/2021, 10:32 AM

## 2021-12-13 ENCOUNTER — Other Ambulatory Visit (HOSPITAL_COMMUNITY): Payer: Self-pay

## 2021-12-13 ENCOUNTER — Encounter (HOSPITAL_COMMUNITY): Payer: BC Managed Care – PPO

## 2021-12-13 NOTE — Telephone Encounter (Signed)
Advanced Heart Failure Patient Advocate Encounter  Prior Authorization for Sildenafil has been approved.    Effective dates: 12/10/21 through 12/10/22  Charlann Boxer, CPhT

## 2021-12-14 LAB — ANTI-SCLERODERMA ANTIBODY

## 2021-12-15 ENCOUNTER — Other Ambulatory Visit (HOSPITAL_COMMUNITY): Payer: Self-pay

## 2021-12-15 ENCOUNTER — Telehealth (HOSPITAL_COMMUNITY): Payer: Self-pay | Admitting: Adult Health

## 2021-12-15 ENCOUNTER — Other Ambulatory Visit (HOSPITAL_COMMUNITY): Payer: Self-pay | Admitting: *Deleted

## 2021-12-15 MED ORDER — SILDENAFIL CITRATE 20 MG PO TABS: 20.0000 mg | ORAL_TABLET | Freq: Three times a day (TID) | ORAL | 3 refills | Status: DC

## 2021-12-15 MED ORDER — HYDRALAZINE HCL 25 MG PO TABS: 25.0000 mg | ORAL_TABLET | Freq: Three times a day (TID) | ORAL | 3 refills | Status: DC

## 2021-12-15 NOTE — Progress Notes (Signed)
Paramedicine Encounter    Patient ID: Jennifer [name], female    DOB: 1990/11/14, 31 y.o.   MRN: [id number]  Pt got home from hosp this past Sunday. She did get cardiomems placed.  She does feel bloated.   Meds verified-she had filled her pill box up for the week-she had taken a few doses this week.  I checked pill box and filled in the doses she had already taken.   Refills needed:  Hydralazine-but they rx is only for 30 tabs- so when the refill is moved over it will be for the 10 day supply too so sent message to clinic to send in correct amount.  She has been taking 1/2 tab of sildenafil-per epic it says full tablet.  She threw the bottle away so I am not sure of the count of the tabs too  Her metolazone and carvedilol have been d/c.  Her chart has potassium listed once a week-she no longer takes metolazone weekly right now.   Spoke about diet and food choices and grocery shopping ideas to keep around the perimeter of the store.   Cardiomems elevated today:  Take 2 extra torsemide today and tomor per amy.  [name] quite bloated. She reports hard to breathe due to the bloating but not sob.  Will f/u next week.   BP (!) 164/84   Pulse 92   Resp 18   Wt 150 lb (68 kg)   SpO2 98%   BMI 32.46 kg/m  Weight yesterday-148 Last visit weight-146 when she left hosp   Patient Care Team: Elinor Parkinson as PCP - General (Physician Assistant) Jacelyn Pi, MD as PCP - Endocrinology (Endocrinology)  Patient Active Problem List   Diagnosis Date Noted   Heart failure ([place]) 12/03/2021   Acute on chronic systolic CHF (congestive heart failure) ([place]) 12/02/2021   Elevated troponin 12/02/2021   Pancytopenia ([employer]) 12/02/2021   Amenorrhea 12/02/2021   Hyperosmolar hyperglycemic state (HHS) ([place]) 08/26/2021   Small intestinal bacterial overgrowth (SIBO) 08/26/2021   Lower extremity edema 08/26/2021   Hyperkalemia 08/14/2021   Hx of insulin dependent diabetes mellitus 08/14/2021    Acute kidney injury superimposed on chronic kidney disease ([place]) 05/13/2021   Chest pain 03/10/2021   Acute on chronic combined systolic and diastolic CHF (congestive heart failure) ([place]) 02/09/2021   History of astrocytoma of brain 02/09/2021   Severe hyperglycemia due to diabetes mellitus ([place]) 01/25/2021   Diarrhea    Elevated transaminase level    Acute combined systolic and diastolic heart failure (HCC)    Nonischemic cardiomyopathy (HCC)    Acute decompensated heart failure (HCC)    Elevated liver enzymes    Acute renal failure superimposed on stage 3a chronic kidney disease ([place]) 11/26/2020   Abdominal pain 81/19/1478   Chronic systolic CHF (congestive heart failure) ([place]) 11/23/2020   Essential hypertension 11/23/2020   Hypertriglyceridemia 11/23/2020   Type 2 diabetes mellitus with hyperlipidemia ([place]) 10/10/2018   Hypothyroidism 10/10/2018    Current Outpatient Medications:    albuterol (VENTOLIN HFA) 108 (90 Base) MCG/ACT inhaler, Inhale 1-2 puffs into the lungs every 6 (six) hours as needed for wheezing or shortness of breath., Disp: , Rfl:    aspirin EC 81 MG EC tablet, Take 1 tablet (81 mg total) by mouth daily. Swallow whole. (Patient taking differently: Take 81 mg by mouth every evening. Swallow whole.), Disp: 30 tablet, Rfl: 1   DULoxetine (CYMBALTA) 30 MG capsule, Take 30 mg by mouth at bedtime., Disp: ,  Rfl:    erythromycin ophthalmic ointment, , Disp: , Rfl:    estradiol (ESTRACE) 0.5 MG tablet, Take 0.5 mg by mouth daily., Disp: , Rfl:    Evolocumab (REPATHA) 140 MG/ML SOSY, Inject 140 mg into the skin See admin instructions. Inject 140 mg subcutaneously every other Wednesday evening, Disp: , Rfl:    fenofibrate 160 MG tablet, Take 1 tablet (160 mg total) by mouth daily., Disp: 30 tablet, Rfl: 1   hydrALAZINE (APRESOLINE) 25 MG tablet, Take 1 tablet (25 mg total) by mouth every 8 (eight) hours., Disp: 30 tablet, Rfl: 3   insulin regular human CONCENTRATED (HUMULIN  R) 500 UNIT/ML injection, Inject 0.1-0.2 mLs (50-100 Units total) into the skin See admin instructions. 100 units with breakfast and lunch  50units at bedtime, Disp: 20 mL, Rfl: 0   ivabradine (CORLANOR) 7.5 MG TABS tablet, Take 1 tablet (7.5 mg total) by mouth 2 (two) times daily with a meal., Disp: 60 tablet, Rfl: 6   levothyroxine (SYNTHROID) 125 MCG tablet, Take 1 tablet (125 mcg total) by mouth daily at 6 (six) AM., Disp: 30 tablet, Rfl: 1   ofloxacin (OCUFLOX) 0.3 % ophthalmic solution, Place 1 drop into both eyes daily as needed (only take after eye injections per patient)., Disp: , Rfl:    ondansetron (ZOFRAN) 4 MG tablet, Take 4 mg by mouth every 8 (eight) hours as needed for nausea or vomiting., Disp: , Rfl:    pantoprazole (PROTONIX) 40 MG tablet, Take 40 mg by mouth daily., Disp: , Rfl:    pregabalin (LYRICA) 150 MG capsule, Take 1 capsule (150 mg total) by mouth at bedtime. (Patient taking differently: Take 300 mg by mouth at bedtime.), Disp: , Rfl:    prochlorperazine (COMPAZINE) 5 MG tablet, Take 5 mg by mouth every 6 (six) hours as needed for nausea or vomiting., Disp: , Rfl:    rosuvastatin (CRESTOR) 40 MG tablet, Take 40 mg by mouth daily., Disp: , Rfl:    sildenafil (REVATIO) 20 MG tablet, Take 1 tablet (20 mg total) by mouth 3 (three) times daily., Disp: 90 tablet, Rfl: 0   torsemide (DEMADEX) 20 MG tablet, Take 2 tablets (40 mg total) by mouth daily., Disp: , Rfl:    tretinoin (RETIN-A) 0.05 % cream, Apply 1 application topically at bedtime., Disp: , Rfl:    dicyclomine (BENTYL) 10 MG capsule, Take 10 mg by mouth daily. (Patient not taking: Reported on 12/15/2021), Disp: , Rfl:    [name] 1/20 1-20 MG-MCG tablet, Take 1 tablet by mouth daily. (Patient not taking: Reported on 12/15/2021), Disp: , Rfl:    medroxyPROGESTERone (PROVERA) 10 MG tablet, Take 10 mg by mouth daily. (Patient not taking: Reported on 12/15/2021), Disp: , Rfl:    Potassium Chloride ER 20 MEQ TBCR, Take 40 mEq  by mouth once a week. (Patient not taking: Reported on 12/15/2021), Disp: , Rfl:  Allergies  Allergen Reactions   Ketamine Other (See Comments)    In vegetative state for 15 minutes per pt   Erythromycin    Fentanyl Nausea And Vomiting   Maitake Mushroom [[place]] Itching    Itchy throat   Penicillin V Itching    Gi upset   Shellfish Allergy Other (See Comments)    Pt has never had shellfish but tested positive on allergy test. Pt states contrast in CT is okay   Morphine Itching and Rash   Penicillins Rash    Has patient had a PCN reaction causing immediate rash, facial/tongue/throat swelling,  SOB or lightheadedness with hypotension: Y Has patient had a PCN reaction causing severe rash involving mucus membranes or skin necrosis: Y Has patient had a PCN reaction that required hospitalization: N Has patient had a PCN reaction occurring within the last 10 years: Y If all of the above answers are "NO", then may proceed with Cephalosporin use.    Prednisone Rash      Social History   Socioeconomic History   Marital status: Married    Spouse name: Not on file   Number of children: 0   Years of education: Not on file   Highest education level: Not on file  Occupational History   Occupation: [employer]  Tobacco Use   Smoking status: Never   Smokeless tobacco: Never  Vaping Use   Vaping Use: Never used  Substance and Sexual Activity   Alcohol use: Never   Drug use: Never   Sexual activity: Yes  Other Topics Concern   Not on file  Social History Narrative   Not on file   Social Determinants of Health   Financial Resource Strain: Low Risk  (12/10/2021)   Overall Financial Resource Strain (CARDIA)    Difficulty of Paying Living Expenses: Not hard at all  Food Insecurity: No Food Insecurity (12/03/2021)   Hunger Vital Sign    Worried About Running Out of Food in the Last Year: Never true    [place] in the Last Year: Never true  Transportation Needs: No  Transportation Needs (12/03/2021)   PRAPARE - Hydrologist (Medical): No    Lack of Transportation (Non-Medical): No  Physical Activity: Sufficiently Active (12/10/2021)   Exercise Vital Sign    Days of Exercise per Week: 3 days    Minutes of Exercise per Session: 60 min  Stress: No Stress Concern Present (12/10/2021)   [place]    Feeling of Stress : Not at all  Social Connections: [place] (12/10/2021)   Social Connection and Isolation Panel [NHANES]    Frequency of Communication with Friends and Family: More than three times a week    Frequency of Social Gatherings with Friends and Family: More than three times a week    Attends Religious Services: More than 4 times per year    Active Member of Clubs or Organizations: No    Attends Music therapist: More than 4 times per year    Marital Status: Married  Human resources officer Violence: Not At Risk (12/03/2021)   Humiliation, Afraid, Rape, and Kick questionnaire    Fear of Current or Ex-Partner: No    Emotionally Abused: No    Physically Abused: No    Sexually Abused: No    Physical Exam      Future Appointments  Date Time Provider Mariano Colon  01/26/2022 10:00 AM [name], Shaune Pascal, MD [place] None       [name], [place] Paramedic  12/15/21

## 2021-12-15 NOTE — Telephone Encounter (Signed)
   Called by [name] HF Paramedic  Cardiomems Reading 23 up from 18   Complaining of abdominal bloating.and 4 pound weight gain.    Recommended increase torsemide 40 mg twice a day x 2 days then 40 mg daily.  Alyria Krack NP-C  12:04 PM

## 2021-12-16 ENCOUNTER — Other Ambulatory Visit (HOSPITAL_COMMUNITY): Payer: Self-pay

## 2021-12-19 ENCOUNTER — Emergency Department (HOSPITAL_BASED_OUTPATIENT_CLINIC_OR_DEPARTMENT_OTHER)
Admission: EM | Admit: 2021-12-19 | Discharge: 2021-12-19 | Disposition: A | Discharge status: Home / Self Care | Payer: BC Managed Care – PPO | Attending: Emergency Medicine | Admitting: Emergency Medicine

## 2021-12-19 ENCOUNTER — Encounter (HOSPITAL_BASED_OUTPATIENT_CLINIC_OR_DEPARTMENT_OTHER): Payer: Self-pay

## 2021-12-19 DIAGNOSIS — Z7982 Long term (current) use of aspirin: Secondary | ICD-10-CM | POA: Diagnosis not present

## 2021-12-19 DIAGNOSIS — L02415 Cutaneous abscess of right lower limb: Secondary | ICD-10-CM | POA: Insufficient documentation

## 2021-12-19 DIAGNOSIS — L0291 Cutaneous abscess, unspecified: Secondary | ICD-10-CM

## 2021-12-19 MED ORDER — SULFAMETHOXAZOLE-TRIMETHOPRIM 800-160 MG PO TABS: 1.0000 | ORAL_TABLET | Freq: Two times a day (BID) | ORAL | 0 refills | Status: AC

## 2021-12-19 MED ORDER — LIDOCAINE-EPINEPHRINE (PF) 2 %-1:200000 IJ SOLN
10.0000 mL | Freq: Once | INTRAMUSCULAR | Status: AC
  Administered 2021-12-19: 10 mL
  Filled 2021-12-19: qty 20

## 2021-12-19 NOTE — ED Provider Notes (Signed)
[facility] EMERGENCY DEPT Provider Note   CSN: [id number] Arrival date & time: 12/19/21  1325     History  Chief Complaint  Patient presents with   Abscess    Jennifer [name] is a 31 y.o. female who presents to the ED with concerns for an abscess to her right inner thigh onset 2 weeks.  Notes that she has had to have the area drained before in the past.  She does have a primary care provider.  Denies recent fever or drainage to the area.  Notes that she is allergic to penicillin.    The history is provided by the patient. No language interpreter was used.       Home Medications Prior to Admission medications   Medication Sig Start Date End Date Taking? Authorizing Provider  sulfamethoxazole-trimethoprim (BACTRIM DS) 800-160 MG tablet Take 1 tablet by mouth 2 (two) times daily for 7 days. 12/19/21 12/26/21 Yes [facility] A, PA-C  albuterol (VENTOLIN HFA) 108 (90 Base) MCG/ACT inhaler Inhale 1-2 puffs into the lungs every 6 (six) hours as needed for wheezing or shortness of breath. 12/31/18   [provider]  aspirin EC 81 MG EC tablet Take 1 tablet (81 mg total) by mouth daily. Swallow whole. Patient taking differently: Take 81 mg by mouth every evening. Swallow whole. 12/07/20   Ezekiel Slocumb, DO  dicyclomine (BENTYL) 10 MG capsule Take 10 mg by mouth daily. Patient not taking: Reported on 12/15/2021    [provider]  DULoxetine (CYMBALTA) 30 MG capsule Take 30 mg by mouth at bedtime. 01/28/19 12/16/21  [provider]  erythromycin ophthalmic ointment     [provider]  estradiol (ESTRACE) 0.5 MG tablet Take 0.5 mg by mouth daily. 11/17/21   [provider]  Evolocumab (REPATHA) 140 MG/ML SOSY Inject 140 mg into the skin See admin instructions. Inject 140 mg subcutaneously every other Wednesday evening    [provider]  fenofibrate 160 MG tablet Take 1 tablet (160 mg total) by mouth daily. 12/07/20    Ezekiel Slocumb, DO  [name] 1/20 1-20 MG-MCG tablet Take 1 tablet by mouth daily. Patient not taking: Reported on 12/15/2021 11/01/21   [provider]  hydrALAZINE (APRESOLINE) 25 MG tablet Take 1 tablet (25 mg total) by mouth 3 (three) times daily. 12/15/21   [name], Shaune Pascal, MD  insulin regular human CONCENTRATED (HUMULIN R) 500 UNIT/ML injection Inject 0.1-0.2 mLs (50-100 Units total) into the skin See admin instructions. 100 units with breakfast and lunch  50units at bedtime 12/12/21   Domenic Polite, MD  ivabradine (CORLANOR) 7.5 MG TABS tablet Take 1 tablet (7.5 mg total) by mouth 2 (two) times daily with a meal. 08/02/21   [name], Shaune Pascal, MD  levothyroxine (SYNTHROID) 125 MCG tablet Take 1 tablet (125 mcg total) by mouth daily at 6 (six) AM. 12/07/20   Ezekiel Slocumb, DO  medroxyPROGESTERone (PROVERA) 10 MG tablet Take 10 mg by mouth daily. Patient not taking: Reported on 12/15/2021 11/17/21   [provider]  ofloxacin (OCUFLOX) 0.3 % ophthalmic solution Place 1 drop into both eyes daily as needed (only take after eye injections per patient). 09/02/20   [provider]  ondansetron (ZOFRAN) 4 MG tablet Take 4 mg by mouth every 8 (eight) hours as needed for nausea or vomiting. 12/19/18   [provider]  pantoprazole (PROTONIX) 40 MG tablet Take 40 mg by mouth daily. 12/24/19   [provider]  Potassium  Chloride ER 20 MEQ TBCR Take 40 mEq by mouth once a week. Patient not taking: Reported on 12/15/2021 10/24/21   [provider]  pregabalin (LYRICA) 150 MG capsule Take 1 capsule (150 mg total) by mouth at bedtime. Patient taking differently: Take 300 mg by mouth at bedtime. 12/12/21   Domenic Polite, MD  prochlorperazine (COMPAZINE) 5 MG tablet Take 5 mg by mouth every 6 (six) hours as needed for nausea or vomiting.    [provider]  rosuvastatin (CRESTOR) 40 MG tablet Take 40 mg by mouth daily. 08/09/21   [provider]  sildenafil (REVATIO) 20 MG tablet Take 1 tablet (20 mg total) by mouth 3 (three) times daily. 12/15/21   [name], Shaune Pascal, MD  torsemide (DEMADEX) 20 MG tablet Take 2 tablets (40 mg total) by mouth daily. 12/13/21   Domenic Polite, MD  tretinoin (RETIN-A) 0.05 % cream Apply 1 application topically at bedtime. 01/17/21   [provider]      Allergies    Ketamine, Erythromycin, Fentanyl, Maitake mushroom [maitake], Penicillin v, Shellfish allergy, Morphine, Penicillins, and Prednisone    Review of Systems   Review of Systems  Constitutional:  Negative for fever.  All other systems reviewed and are negative.   Physical Exam Updated Vital Signs BP (!) 162/82 (BP Location: Right Arm)   Pulse (!) 126   Temp 98.1 F (36.7 C) (Oral)   Resp 18   LMP  (LMP Unknown)   SpO2 99%  Physical Exam Vitals and nursing note reviewed.  Constitutional:      General: [name] not in acute distress.    Appearance: [name] not diaphoretic.  HENT:     Head: Normocephalic and atraumatic.     Mouth/Throat:     Pharynx: No oropharyngeal exudate.  Eyes:     General: No scleral icterus.    Conjunctiva/sclera: Conjunctivae normal.  Cardiovascular:     Rate and Rhythm: Normal rate and regular rhythm.     Pulses: Normal pulses.     Heart sounds: Normal heart sounds.  Pulmonary:     Effort: Pulmonary effort is normal. No respiratory distress.     Breath sounds: Normal breath sounds. No wheezing.  Abdominal:     General: Bowel sounds are normal.     Palpations: Abdomen is soft. There is no mass.     Tenderness: There is no abdominal tenderness. There is no guarding or rebound.  Genitourinary:    Comments: RN chaperone present for GU exam.  Area of induration and fluctuance noted to the right upper inner thigh.  No overlying erythema noted.  No active drainage noted.  No tenderness to palpation noted to the area. Musculoskeletal:        General: Normal range of motion.      Cervical back: Normal range of motion and neck supple.  Skin:    General: Skin is warm and dry.     Comments: 2 cm area of fluctuance and induration noted to right upper medial thigh without overlying erythema.   Neurological:     Mental Status: [name] alert.  Psychiatric:        Behavior: Behavior normal.     ED Results / Procedures / Treatments   Labs (all labs ordered are listed, but only abnormal results are displayed) Labs Reviewed - No data to display  EKG None  Radiology No results found.  Procedures .Marland KitchenIncision and Drainage  Date/Time: 12/19/2021 4:55 PM  Performed by: Steva Colder  A, PA-C Authorized by: Nehemiah Settle, PA-C   Consent:    Consent obtained:  Verbal   Consent given by:  Patient   Risks discussed:  Bleeding, incomplete drainage and pain Universal protocol:    Patient identity confirmed:  Verbally with patient and hospital-assigned identification number Location:    Type:  Abscess   Size:  2 cm   Location:  Lower extremity   Lower extremity location:  Leg   Leg location:  R upper leg Pre-procedure details:    Skin preparation:  Povidone-iodine Sedation:    Sedation type:  None Anesthesia:    Anesthesia method:  Local infiltration   Local anesthetic:  Lidocaine 2% WITH epi (2) Procedure type:    Complexity:  Simple Procedure details:    Ultrasound guidance: yes     Needle aspiration: yes     Needle size:  18 G   Incision types:  Stab incision   Wound management:  Probed and deloculated and irrigated with saline   Drainage:  Bloody and purulent   Drainage amount:  Moderate   Wound treatment:  Wound left open   Packing materials:  None Post-procedure details:    Procedure completion:  Tolerated well, no immediate complications Ultrasound ED Soft Tissue  Date/Time: 12/19/2021 4:56 PM  Performed by: Steva Colder A, PA-C Authorized by: Steva Colder A, PA-C   Procedure details:    Indications: localization of abscess     Transverse  view:  Visualized   Longitudinal view:  Visualized   Images: not archived   Location:    Location: lower extremity     Side:  Right Findings:     abscess present     Medications Ordered in ED Medications  lidocaine-EPINEPHrine (XYLOCAINE W/EPI) 2 %-1:200000 (PF) injection 10 mL (10 mLs Infiltration Given 12/19/21 1504)    ED Course/ Medical Decision Making/ A&P                           Medical Decision Making Risk Prescription drug management.   Pt presents with concerns for abscess onset 2 weeks. Has a history of similar symptoms that she has had to have drained in the ED. Vital signs, pt afebrile. On exam, pt with 2 cm area of fluctuance and induration noted to right upper medial thigh without overlying erythema. No acute cardiovascular, respiratory, abdominal exam findings. Differential diagnosis includes abscess, cellulitis, bartholin's cyst.    Disposition: Presentation suspicious for abscess. Doubt cellulitis or bartholin cyst at this time. After consideration of the diagnostic results and the patients response to treatment, I feel that the patient would benefit from Discharge home. Pt will be discharged home with bactrim and instructed to follow up with primary care provider. Supportive care measures and strict return precautions discussed with patient at bedside. Pt acknowledges and verbalizes understanding. Pt appears safe for discharge. Follow up as indicated in discharge paperwork.    This chart was dictated using voice recognition software, Dragon. Despite the best efforts of this provider to proofread and correct errors, errors may still occur which can change documentation meaning.   Final Clinical Impression(s) / ED Diagnoses Final diagnoses:  Abscess    Rx / DC Orders ED Discharge Orders          Ordered    sulfamethoxazole-trimethoprim (BACTRIM DS) 800-160 MG tablet  2 times daily        12/19/21 1726  Deshunda Thackston A, PA-C 12/19/21  1727    Fransico Meadow, MD 12/20/21 [phone]

## 2021-12-19 NOTE — ED Notes (Signed)
Dressed wound with non-adherent dressing. Clean dry and intact. Instructed pt. On how to care for wound with signs and symptoms of infection to look for.

## 2021-12-19 NOTE — ED Triage Notes (Signed)
She tells me she has an abscess at inner, proximal right thigh area. She states "I get these a lot". [name] ambulatory and in no distress. She also states she has chronic tachycardia.

## 2021-12-19 NOTE — Discharge Instructions (Addendum)
It was a pleasure taking care of you today!   Your abscess was drained today in the emergency department.  Keep the area clean and dry as much as possible.  You gently massage the area as needed.  You may follow-up with your primary care provider regarding today's ED visit.  You will be sent a prescription for Bactrim, take as directed.  Return to the emergency department for experiencing increasing/worsening symptoms.

## 2021-12-21 ENCOUNTER — Telehealth (HOSPITAL_COMMUNITY): Payer: Self-pay | Admitting: Adult Health

## 2021-12-21 MED ORDER — TORSEMIDE 20 MG PO TABS: 80.0000 mg | ORAL_TABLET | Freq: Every day | ORAL | 6 refills | Status: DC

## 2021-12-21 NOTE — Telephone Encounter (Signed)
     Cardiomems Remote Monitoring  S/P Cardiomems Implant   PAD Goal: 17  Most recent reading: 27   Recommended changes: Increase torsemide to 80 mg daily. Needs BMET next week.   I continue to review and analyze the patients PA pressures weekly (and more often as needed) to bring PA pressures within the optimal range.    I called [name] with HF Paramedicine to discuss med changes.     Rion Catala NP-C  4:36 PM

## 2021-12-22 ENCOUNTER — Encounter (HOSPITAL_COMMUNITY): Payer: Self-pay | Admitting: Internal Medicine

## 2021-12-22 ENCOUNTER — Other Ambulatory Visit (HOSPITAL_COMMUNITY): Payer: Self-pay

## 2021-12-22 NOTE — Progress Notes (Signed)
Paramedicine Encounter    Patient ID: Jennifer [name], female    DOB: 07/24/1990, 31 y.o.   MRN: [id number]  [facility] to [facility] today and they were talking about dialysis in the future-like maybe in the next year or so.  They took a lot ot labs today and want her to get kidney biopsy to rule anything else out.  Random c/p. No dizziness.  [name] called me yesterday that her cardiomems was elevated and to adjust her torsemide to 49m daily.  She reports yesterday she has more sob and now she is more sob when lying down. Feels better today than she did yesterday.  She reports taking her meds as directed and not adding salt to foods and reports she is buying frozen vegetables and washing off canned food.  She filled up her pill box prior to me arriving so I checked the pill box.  She had to go to ER the other day for abscess on her thigh and was put on antibiotics for it- she reports that it is not helping.  Advised her to reach out to PCP ref that.   Meds verified and pill box checked.   She will start the 833mtorsemide today-she is wanting to take 4015mn AM and 17m30mPM. She said that works best for her instead of the 80mg68monce.    BP (!) 152/84   Pulse (!) 104   Resp 16   Wt 147 lb (66.7 kg)   LMP  (LMP Unknown)   SpO2 98%   BMI 31.81 kg/m  Weight yesterday-149 Last visit weight-150  Patient Care Team: Jennifer [name] - General (Physician Assistant) Jennifer [name] PCP - Endocrinology (Endocrinology)  Patient Active Problem List   Diagnosis Date Noted   Heart failure (HCC) [place]08/2023   Acute on chronic systolic CHF (congestive heart failure) (HCC) [place]07/2023   Elevated troponin 12/02/2021   Pancytopenia (HCC) [place]07/2023   Amenorrhea 12/02/2021   Hyperosmolar hyperglycemic state (HHS) (HCC) [place]01/2023   Small intestinal bacterial overgrowth (SIBO) 08/26/2021   Lower extremity edema 08/26/2021   Hyperkalemia 08/14/2021   Hx of insulin  dependent diabetes mellitus 08/14/2021   Acute kidney injury superimposed on chronic kidney disease (HCC) [place]16/2023   Chest pain 03/10/2021   Acute on chronic combined systolic and diastolic CHF (congestive heart failure) (HCC) [place]15/2022   History of astrocytoma of brain 02/09/2021   Severe hyperglycemia due to diabetes mellitus (HCC) [place]31/2022   Diarrhea    Elevated transaminase level    Acute combined systolic and diastolic heart failure (HCC)    Nonischemic cardiomyopathy (HCC)    Acute decompensated heart failure (HCC)    Elevated liver enzymes    Acute renal failure superimposed on stage 3a chronic kidney disease (HCC) [place]01/2022   Abdominal pain 10/2946/18/5631ronic systolic CHF (congestive heart failure) (HCC) [place]29/2022   Essential hypertension 11/23/2020   Hypertriglyceridemia 11/23/2020   Type 2 diabetes mellitus with hyperlipidemia (HCC) [place]15/2020   Hypothyroidism 10/10/2018    Current Outpatient Medications:    albuterol (VENTOLIN HFA) 108 (90 Base) MCG/ACT inhaler, Inhale 1-2 puffs into the lungs every 6 (six) hours as needed for wheezing or shortness of breath., Disp: , Rfl:    aspirin EC 81 MG EC tablet, Take 1 tablet (81 mg total) by mouth daily. Swallow whole. (Patient taking differently: Take 81 mg by mouth every evening. Swallow whole.), Disp: 30 tablet, Rfl: 1   erythromycin ophthalmic  ointment, , Disp: , Rfl:    Evolocumab (REPATHA) 140 MG/ML SOSY, Inject 140 mg into the skin See admin instructions. Inject 140 mg subcutaneously every other Wednesday evening, Disp: , Rfl:    fenofibrate 160 MG tablet, Take 1 tablet (160 mg total) by mouth daily., Disp: 30 tablet, Rfl: 1   hydrALAZINE (APRESOLINE) 25 MG tablet, Take 1 tablet (25 mg total) by mouth 3 (three) times daily., Disp: 90 tablet, Rfl: 3   insulin regular human CONCENTRATED (HUMULIN R) 500 UNIT/ML injection, Inject 0.1-0.2 mLs (50-100 Units total) into the skin See admin instructions. 100 units with breakfast  and lunch  50units at bedtime, Disp: 20 mL, Rfl: 0   ivabradine (CORLANOR) 7.5 MG TABS tablet, Take 1 tablet (7.5 mg total) by mouth 2 (two) times daily with a meal., Disp: 60 tablet, Rfl: 6   levothyroxine (SYNTHROID) 125 MCG tablet, Take 1 tablet (125 mcg total) by mouth daily at 6 (six) AM., Disp: 30 tablet, Rfl: 1   medroxyPROGESTERone (PROVERA) 10 MG tablet, Take 10 mg by mouth daily., Disp: , Rfl:    ofloxacin (OCUFLOX) 0.3 % ophthalmic solution, Place 1 drop into both eyes daily as needed (only take after eye injections per patient)., Disp: , Rfl:    ondansetron (ZOFRAN) 4 MG tablet, Take 4 mg by mouth every 8 (eight) hours as needed for nausea or vomiting., Disp: , Rfl:    pantoprazole (PROTONIX) 40 MG tablet, Take 40 mg by mouth daily., Disp: , Rfl:    pregabalin (LYRICA) 150 MG capsule, Take 1 capsule (150 mg total) by mouth at bedtime. (Patient taking differently: Take 300 mg by mouth at bedtime.), Disp: , Rfl:    prochlorperazine (COMPAZINE) 5 MG tablet, Take 5 mg by mouth every 6 (six) hours as needed for nausea or vomiting., Disp: , Rfl:    rosuvastatin (CRESTOR) 40 MG tablet, Take 40 mg by mouth daily., Disp: , Rfl:    sildenafil (REVATIO) 20 MG tablet, Take 1 tablet (20 mg total) by mouth 3 (three) times daily., Disp: 90 tablet, Rfl: 3   sulfamethoxazole-trimethoprim (BACTRIM DS) 800-160 MG tablet, Take 1 tablet by mouth 2 (two) times daily for 7 days., Disp: 14 tablet, Rfl: 0   torsemide (DEMADEX) 20 MG tablet, Take 4 tablets (80 mg total) by mouth daily., Disp: 120 tablet, Rfl: 6   tretinoin (RETIN-A) 0.05 % cream, Apply 1 application topically at bedtime., Disp: , Rfl:    dicyclomine (BENTYL) 10 MG capsule, Take 10 mg by mouth daily. (Patient not taking: Reported on 12/15/2021), Disp: , Rfl:    DULoxetine (CYMBALTA) 30 MG capsule, Take 30 mg by mouth at bedtime., Disp: , Rfl:    estradiol (ESTRACE) 0.5 MG tablet, Take 0.5 mg by mouth daily. (Patient not taking: Reported on  12/22/2021), Disp: , Rfl:    [name] 1/20 1-20 MG-MCG tablet, Take 1 tablet by mouth daily. (Patient not taking: Reported on 12/15/2021), Disp: , Rfl:    Potassium Chloride ER 20 MEQ TBCR, Take 40 mEq by mouth once a week. (Patient not taking: Reported on 12/15/2021), Disp: , Rfl:  Allergies  Allergen Reactions   Ketamine Other (See Comments)    In vegetative state for 15 minutes per pt   Erythromycin    Fentanyl Nausea And Vomiting   Maitake Mushroom [Maitake] Itching    Itchy throat   Penicillin V Itching    Gi upset   Shellfish Allergy Other (See Comments)    Pt has never had  shellfish but tested positive on allergy test. Pt states contrast in CT is okay   Morphine Itching and Rash   Penicillins Rash    Has patient had a PCN reaction causing immediate rash, facial/tongue/throat swelling, SOB or lightheadedness with hypotension: Y Has patient had a PCN reaction causing severe rash involving mucus membranes or skin necrosis: Y Has patient had a PCN reaction that required hospitalization: N Has patient had a PCN reaction occurring within the last 10 years: Y If all of the above answers are "NO", then may proceed with Cephalosporin use.    Prednisone Rash      Social History   Socioeconomic History   Marital status: Married    Spouse name: Not on file   Number of children: 0   Years of education: Not on file   Highest education level: Not on file  Occupational History   Occupation: [employer]  Tobacco Use   Smoking status: Never   Smokeless tobacco: Never  Vaping Use   Vaping Use: Never used  Substance and Sexual Activity   Alcohol use: Never   Drug use: Never   Sexual activity: Yes  Other Topics Concern   Not on file  Social History Narrative   Not on file   Social Determinants of Health   Financial Resource Strain: Low Risk  (12/10/2021)   Overall Financial Resource Strain (CARDIA)    Difficulty of Paying Living Expenses: Not hard at all  Food Insecurity: No  Food Insecurity (12/03/2021)   Hunger Vital Sign    Worried About Running Out of Food in the Last Year: Never true    [place] in the Last Year: Never true  Transportation Needs: No Transportation Needs (12/03/2021)   PRAPARE - Hydrologist (Medical): No    Lack of Transportation (Non-Medical): No  Physical Activity: Sufficiently Active (12/10/2021)   Exercise Vital Sign    Days of Exercise per Week: 3 days    Minutes of Exercise per Session: 60 min  Stress: No Stress Concern Present (12/10/2021)   [place]    Feeling of Stress : Not at all  Social Connections: [place] (12/10/2021)   Social Connection and Isolation Panel [NHANES]    Frequency of Communication with Friends and Family: More than three times a week    Frequency of Social Gatherings with Friends and Family: More than three times a week    Attends Religious Services: More than 4 times per year    Active Member of Clubs or Organizations: No    Attends Music therapist: More than 4 times per year    Marital Status: Married  Human resources officer Violence: Not At Risk (12/03/2021)   Humiliation, Afraid, Rape, and Kick questionnaire    Fear of Current or Ex-Partner: No    Emotionally Abused: No    Physically Abused: No    Sexually Abused: No    Physical Exam      Future Appointments  Date Time Provider [place]  01/26/2022 10:00 AM [name], Shaune Pascal, MD [place] None       [name], [place] Paramedic  12/22/21

## 2021-12-23 NOTE — Discharge Summary (Signed)
Physician Discharge Summary  Jennifer [name] KTG:[id number] DOB: Jun 15, 1990 DOA: 12/01/2021  PCP: Sue Lush, PA-C  Admit date: 12/01/2021 Discharge date: 12/12/2021  Time spent: 35 minutes  Recommendations for Outpatient Follow-up:  CHF clinic in 10days with BMP PCP in 1 week, please adjust Insulin dose based on CBGs and kidney function   Discharge Diagnoses:  Principal Problem:   Acute on chronic systolic CHF (congestive heart failure) (HCC)   RV failure   Chronic abd pain   Chronic pancreatitis   Brittle DM   Acute kidney injury superimposed on chronic kidney disease 3b   Essential hypertension   Type 2 diabetes mellitus with hyperlipidemia (HCC)   Pancytopenia (HCC)   History of astrocytoma of brain   Hypothyroidism   Discharge Condition: Stable  Diet recommendation: Low-sodium, diabetic  Filed Weights   12/10/21 0554 12/11/21 0409 12/12/21 0412  Weight: 67 kg 66.4 kg 66.7 kg    History of present illness:  31 yo female with history of astrocytoma sp resection and chemotherapy, chronic systolic CHF, history of BiV failure with recovered EF, CKD 3b, brittle type 2 diabetes presented with 3 days of chest pain.  And epigastric pain. -Noted to be volume overloaded, followed by advanced heart failure team, started on diuretics, hospitalization prolonged with worsening renal failure, underwent right heart cath on 9/11-wedge pressure was 16, PA was 37/23 CO/CI was 4.2/2.6 -9/11 started on IV milrinone  Hospital Course:   Acute on chronic systolic CHF RV failure -History of BiV failure with recovered EF -Last echo with EF of 50-55%, mild MR, mild to moderate TR -Right heart cath 9/11 noted PA pressures of 37/23, wedge pressure of 16, VQ scan was negative on admission -Cardio mems placed 9/11 -Diuresed with IV Lasix initially, she is 9.5 L negative -9/11 started on milrinone, discontinued 9/15 -Creatinine trended back up to 3.2 yesterday, torsemide held, creatinine  improving, down to 2.3 -Entresto, Corlanor and Coreg held, started low-dose sildenafil  -Off Jardiance, history of UTIs, off Aldactone with hyperkalemia -Discussed with heart failure team, recommended to restart torsemide tomorrow after discharge   AKI on CKD3b -Baseline creatinine 1.7-2.3 -Cardiorenal, earlier this admission developed worsening AKI with hypotension, was improving on milrinone, now back up to 3.2, see discussion above -Creatinine down to 2.3 at discharge   Type 2 diabetes mellitus  Brittle diabetic with severe hyper and hypoglycemia,  -Restarted on U-500, she takes > 200 units for breakfast and lunch and 100 units for dinner, further complicated by AKI and decreased renal clearance, had increased U-500 to 100 units a.m. and afternoon, 50 units at bedtime,  -Now stable, will need further adjustment based on GFR and CBGs   Anemia of chronic disease Mild thrombocytopenia -Anemia panel 12/22 with mild iron deficiency -Hemoglobin relatively stable   History of astrocytoma of brain -follow up as outpatient.    Hypothyroidism Continue  levothyroxine    Severe hyperTG - Due to LPL deficiency, chronic pancreatitis -Continue fenofibrate -TG down to 150 this admission -Followed by endocrinology at [facility]   Chronic abdominal pain and symptoms -Likely secondary to chronic pancreatitis -Extensive GI notes from [facility] reviewed, this has been suspected to be related to chronic pancreatitis from hypertriglyceridemia -No acute symptoms at this time, triglyceride down to 150 this week  Discharge Exam: Vitals:   12/12/21 0751 12/12/21 1152  BP: 116/75 (!) 140/76  Pulse: 97 84  Resp: 16 17  Temp: 98.1 F (36.7 C) 97.8 F (36.6 C)  SpO2: 98% 99%  General exam: Chronically ill young female sitting up in bed, AAOx3, no distress HEENT: Unable to assess JVD CVS: S1-S2, regular rate rhythm Lungs: Decreased breath sounds the bases Abdomen: Soft, mildly distended, nontender,  bowel sounds present Extremities: No edema  Skin: No rashes Psychiatry:  Mood & affect appropriate  Discharge Instructions    Allergies as of 12/12/2021       Reactions   Ketamine Other (See Comments)   In vegetative state for 15 minutes per pt   Erythromycin    Fentanyl Nausea And Vomiting   Maitake Mushroom [maitake] Itching   Itchy throat   Penicillin V Itching   Gi upset   Shellfish Allergy Other (See Comments)   Pt has never had shellfish but tested positive on allergy test. Pt states contrast in CT is okay   Morphine Itching, Rash   Penicillins Rash   Has patient had a PCN reaction causing immediate rash, facial/tongue/throat swelling, SOB or lightheadedness with hypotension: Y Has patient had a PCN reaction causing severe rash involving mucus membranes or skin necrosis: Y Has patient had a PCN reaction that required hospitalization: N Has patient had a PCN reaction occurring within the last 10 years: Y If all of the above answers are "NO", then may proceed with Cephalosporin use.   Prednisone Rash        Medication List     STOP taking these medications    carvedilol 3.125 MG tablet Commonly known as: Coreg   metolazone 2.5 MG tablet Commonly known as: ZAROXOLYN   nitroGLYCERIN 0.4 MG SL tablet Commonly known as: NITROSTAT   torsemide 20 MG tablet Commonly known as: DEMADEX       TAKE these medications    albuterol 108 (90 Base) MCG/ACT inhaler Commonly known as: VENTOLIN HFA Inhale 1-2 puffs into the lungs every 6 (six) hours as needed for wheezing or shortness of breath.   aspirin EC 81 MG tablet Take 1 tablet (81 mg total) by mouth daily. Swallow whole. What changed: when to take this   dicyclomine 10 MG capsule Commonly known as: BENTYL Take 10 mg by mouth daily.   DULoxetine 30 MG capsule Commonly known as: CYMBALTA Take 30 mg by mouth at bedtime.   erythromycin ophthalmic ointment   estradiol 0.5 MG tablet Commonly known as:  ESTRACE Take 0.5 mg by mouth daily.   fenofibrate 160 MG tablet Take 1 tablet (160 mg total) by mouth daily.   [name] 1/20 1-20 MG-MCG tablet Generic drug: norethindrone-ethinyl estradiol-FE Take 1 tablet by mouth daily.   insulin regular human CONCENTRATED 500 UNIT/ML injection Commonly known as: HUMULIN R Inject 0.1-0.2 mLs (50-100 Units total) into the skin See admin instructions. 100 units with breakfast and lunch  50units at bedtime What changed:  how much to take additional instructions   ivabradine 7.5 MG Tabs tablet Commonly known as: CORLANOR Take 1 tablet (7.5 mg total) by mouth 2 (two) times daily with a meal.   levothyroxine 125 MCG tablet Commonly known as: SYNTHROID Take 1 tablet (125 mcg total) by mouth daily at 6 (six) AM.   medroxyPROGESTERone 10 MG tablet Commonly known as: PROVERA Take 10 mg by mouth daily.   ofloxacin 0.3 % ophthalmic solution Commonly known as: OCUFLOX Place 1 drop into both eyes daily as needed (only take after eye injections per patient).   ondansetron 4 MG tablet Commonly known as: ZOFRAN Take 4 mg by mouth every 8 (eight) hours as needed for nausea or vomiting.  pantoprazole 40 MG tablet Commonly known as: PROTONIX Take 40 mg by mouth daily.   Potassium Chloride ER 20 MEQ Tbcr Take 40 mEq by mouth once a week.   pregabalin 150 MG capsule Commonly known as: LYRICA Take 1 capsule (150 mg total) by mouth at bedtime. What changed:  medication strength how much to take when to take this   prochlorperazine 5 MG tablet Commonly known as: COMPAZINE Take 5 mg by mouth every 6 (six) hours as needed for nausea or vomiting.   Repatha 140 MG/ML Sosy Generic drug: Evolocumab Inject 140 mg into the skin See admin instructions. Inject 140 mg subcutaneously every other Wednesday evening   rosuvastatin 40 MG tablet Commonly known as: CRESTOR Take 40 mg by mouth daily.   tretinoin 0.05 % cream Commonly known as:  RETIN-A Apply 1 application topically at bedtime.       Allergies  Allergen Reactions   Ketamine Other (See Comments)    In vegetative state for 15 minutes per pt   Erythromycin    Fentanyl Nausea And Vomiting   Maitake Mushroom [Maitake] Itching    Itchy throat   Penicillin V Itching    Gi upset   Shellfish Allergy Other (See Comments)    Pt has never had shellfish but tested positive on allergy test. Pt states contrast in CT is okay   Morphine Itching and Rash   Penicillins Rash    Has patient had a PCN reaction causing immediate rash, facial/tongue/throat swelling, SOB or lightheadedness with hypotension: Y Has patient had a PCN reaction causing severe rash involving mucus membranes or skin necrosis: Y Has patient had a PCN reaction that required hospitalization: N Has patient had a PCN reaction occurring within the last 10 years: Y If all of the above answers are "NO", then may proceed with Cephalosporin use.    Prednisone Rash    Follow-up Information     [facility] Follow up on 12/13/2021.   Specialty: Cardiology Why: at 3:30 bring all medications. Contact information: [address] [id number] [place] [phone]                 The results of significant diagnostics from this hospitalization (including imaging, microbiology, ancillary and laboratory) are listed below for reference.    Significant Diagnostic Studies: CARDIAC CATHETERIZATION  Result Date: 12/06/2021 Findings: RA = 15 RV = 41/15 PA = 37/23 (28) PCW = 16 Fick cardiac output/index = 4.2/2.6 Thermo CO/CI = 3.7/2.3 PVR = 4.3 (TD) Ao sat = 94% PA sat = 51%, 54% Assessment: 1. Mild PAH with primarily RV failure Plan/Discussion: Successful cardiomems implant. Start low-dose milrinone to optimize RV failure. Glori Bickers, MD 8:47 AM  NM Pulmonary Perfusion  Result Date: 12/03/2021 CLINICAL DATA:  Concern for  pulmonary embolism. Chest pain for 1 week. Short of breath. EXAM: NUCLEAR MEDICINE PERFUSION LUNG SCAN TECHNIQUE: Perfusion images were obtained in multiple projections after intravenous injection of radiopharmaceutical. RADIOPHARMACEUTICALS:  4.0 mCi Tc-81mMAA COMPARISON:  Radiograph 12/03/2021 FINDINGS: no wedge-shaped peripheral perfusion defects within LEFT or RIGHT lung. Normal perfusion pattern IMPRESSION: No evidence acute pulmonary embolism. Electronically Signed   By: SSuzy BouchardM.D.   On: 12/03/2021 14:05   ECHOCARDIOGRAM COMPLETE  Result Date: 12/03/2021    ECHOCARDIOGRAM REPORT   Patient Name:   Jennifer FRILEYDate of Exam: 12/03/2021 Medical Rec #:  [id number]        Height:  57.0 in Accession #:    [id number]        Weight:       147.0 lb Date of Birth:  1990-08-20         BSA:          1.578 m Patient Age:    31 years          BP:           120/65 mmHg Patient Gender: F                 HR:           99 bpm. Exam Location:  Inpatient Procedure: 2D Echo, Cardiac Doppler and Color Doppler Indications:    Chest pain  History:        Patient has prior history of Echocardiogram examinations, most                 recent 08/15/2021. Risk Factors:Diabetes and Hypertension.                 Non-ischemic cardiomyopathy. CKD.  Sonographer:    Clayton Lefort RDCS (AE) Referring Phys: [id number] RONDELL A SMITH IMPRESSIONS  1. Low normal to mild global reduction in LV systolic function; EF 50.  2. Left ventricular ejection fraction, by estimation, is 50 to 55%. The left ventricle has low normal function. The left ventricle has no regional wall motion abnormalities. Left ventricular diastolic parameters were normal.  3. Right ventricular systolic function is normal. The right ventricular size is normal. There is mildly elevated pulmonary artery systolic pressure.  4. The mitral valve is normal in structure. Mild mitral valve regurgitation. No evidence of mitral stenosis.  5. Tricuspid valve regurgitation is  mild to moderate.  6. The aortic valve is tricuspid. Aortic valve regurgitation is mild. No aortic stenosis is present.  7. The inferior vena cava is normal in size with greater than 50% respiratory variability, suggesting right atrial pressure of 3 mmHg. FINDINGS  Left Ventricle: Left ventricular ejection fraction, by estimation, is 50 to 55%. The left ventricle has low normal function. The left ventricle has no regional wall motion abnormalities. The left ventricular internal cavity size was normal in size. There is no left ventricular hypertrophy. Left ventricular diastolic parameters were normal. Right Ventricle: The right ventricular size is normal. Right ventricular systolic function is normal. There is mildly elevated pulmonary artery systolic pressure. The tricuspid regurgitant velocity is 2.69 m/s, and with an assumed right atrial pressure of 8 mmHg, the estimated right ventricular systolic pressure is 10.2 mmHg. Left Atrium: Left atrial size was normal in size. Right Atrium: Right atrial size was normal in size. Pericardium: There is no evidence of pericardial effusion. Mitral Valve: The mitral valve is normal in structure. Mild mitral valve regurgitation. No evidence of mitral valve stenosis. Tricuspid Valve: The tricuspid valve is normal in structure. Tricuspid valve regurgitation is mild to moderate. No evidence of tricuspid stenosis. Aortic Valve: The aortic valve is tricuspid. Aortic valve regurgitation is mild. No aortic stenosis is present. Aortic valve mean gradient measures 4.0 mmHg. Aortic valve peak gradient measures 7.3 mmHg. Aortic valve area, by VTI measures 1.92 cm. Pulmonic Valve: The pulmonic valve was normal in structure. Pulmonic valve regurgitation is trivial. No evidence of pulmonic stenosis. Aorta: The aortic root is normal in size and structure. Venous: The inferior vena cava is normal in size with greater than 50% respiratory variability, suggesting right atrial pressure of 3 mmHg.  IAS/Shunts: No atrial  level shunt detected by color flow Doppler. Additional Comments: Low normal to mild global reduction in LV systolic function; EF 50.  LEFT VENTRICLE PLAX 2D LVIDd:         4.50 cm LVIDs:         3.40 cm LV PW:         0.90 cm LV IVS:        1.00 cm LVOT diam:     1.90 cm LV SV:         42 LV SV Index:   27 LVOT Area:     2.84 cm  RIGHT VENTRICLE             IVC RV Basal diam:  2.70 cm     IVC diam: 1.60 cm RV S prime:     11.40 cm/s TAPSE (M-mode): 1.9 cm LEFT ATRIUM             Index        RIGHT ATRIUM           Index LA diam:        2.40 cm 1.52 cm/m   RA Area:     17.00 cm LA Vol (A2C):   14.6 ml 9.25 ml/m   RA Volume:   49.10 ml  31.11 ml/m LA Vol (A4C):   28.5 ml 18.06 ml/m LA Biplane Vol: 21.3 ml 13.50 ml/m  AORTIC VALVE AV Area (Vmax):    1.84 cm AV Area (Vmean):   1.89 cm AV Area (VTI):     1.92 cm AV Vmax:           135.00 cm/s AV Vmean:          88.500 cm/s AV VTI:            0.220 m AV Peak Grad:      7.3 mmHg AV Mean Grad:      4.0 mmHg LVOT Vmax:         87.80 cm/s LVOT Vmean:        59.100 cm/s LVOT VTI:          0.149 m LVOT/AV VTI ratio: 0.68  AORTA Ao Root diam: 2.50 cm Ao Asc diam:  2.80 cm TRICUSPID VALVE TR Peak grad:   28.9 mmHg TR Vmax:        269.00 cm/s  SHUNTS Systemic VTI:  0.15 m Systemic Diam: 1.90 cm Kirk Ruths MD Electronically signed by Kirk Ruths MD Signature Date/Time: 12/03/2021/12:44:00 PM    Final    DG Chest Port 1 View  Result Date: 12/03/2021 CLINICAL DATA:  Chest pain. EXAM: PORTABLE CHEST 1 VIEW COMPARISON:  December 01, 2021. FINDINGS: The heart size and mediastinal contours are within normal limits. Both lungs are clear. The visualized skeletal structures are unremarkable. IMPRESSION: No active disease. Electronically Signed   By: Marijo Conception M.D.   On: 12/03/2021 11:29   DG Chest 2 View  Result Date: 12/01/2021 CLINICAL DATA:  Chest pain EXAM: CHEST - 2 VIEW COMPARISON:  09/01/2021 FINDINGS: The heart size and mediastinal  contours are within normal limits. Both lungs are clear. The visualized skeletal structures are unremarkable. IMPRESSION: No active cardiopulmonary disease. Electronically Signed   By: Donavan Foil M.D.   On: 12/01/2021 20:26   US Abdomen Limited  Result Date: 11/25/2021 CLINICAL DATA:  Ascites search. EXAM: LIMITED ABDOMEN ULTRASOUND FOR ASCITES TECHNIQUE: Limited ultrasound survey for ascites was performed in all four abdominal quadrants. COMPARISON:  Renal ultrasound Aug 14, 2021; CT abdomen pelvis 03/06/2021 FINDINGS: No ascites demonstrated within all 4 quadrants. IMPRESSION: No ascites. Electronically Signed   By: Lovey Newcomer M.D.   On: 11/25/2021 09:49    Microbiology: No results found for this or any previous visit (from the past 240 hour(s)).   Labs: Basic Metabolic Panel: No results for input(s): "NA", "K", "CL", "CO2", "GLUCOSE", "BUN", "CREATININE", "CALCIUM", "MG", "PHOS" in the last 168 hours. Liver Function Tests: No results for input(s): "AST", "ALT", "ALKPHOS", "BILITOT", "PROT", "ALBUMIN" in the last 168 hours. No results for input(s): "LIPASE", "AMYLASE" in the last 168 hours. No results for input(s): "AMMONIA" in the last 168 hours. CBC: No results for input(s): "WBC", "NEUTROABS", "HGB", "HCT", "MCV", "PLT" in the last 168 hours. Cardiac Enzymes: No results for input(s): "CKTOTAL", "CKMB", "CKMBINDEX", "TROPONINI" in the last 168 hours. BNP: BNP (last 3 results) Recent Labs    11/01/21 1142 11/12/21 1056 12/01/21 2011  BNP 17.9 27.8 41.3    ProBNP (last 3 results) No results for input(s): "PROBNP" in the last 8760 hours.  CBG: No results for input(s): "GLUCAP" in the last 168 hours.     Signed:  Domenic Polite MD.  [facility] 12/23/2021, 1:10 PM

## 2021-12-30 ENCOUNTER — Other Ambulatory Visit (HOSPITAL_COMMUNITY): Payer: Self-pay

## 2021-12-30 NOTE — Progress Notes (Signed)
Paramedicine Encounter    Patient ID: Jennifer [name], female    DOB: 1991/03/07, 31 y.o.   MRN: [id number]  Pt reports she has been having increased c/p past week. She describes it in middle/upper part of her chest. She states it feels dull pain, does not radiate, does not feel more sob when this happens. She reports the pain is short and goes away after a min or so. Does not take anything for it, it goes away on its own.  ' A lot of palpitations 2 days ago.   She reports a fever yesterday-she is done with antibiotics for that abscess.  [name] using her cardiomems-not every day but at least every other day.  She goes [place] for kidney biopsy.  Reviewed her diet from dinner last night and what she reported it was low sodium foods. Denies drinking lots of fluids.  She had been taking 14m torsemide BID but will try the 859mat once to see if that will improve. She chose to take it separate due to her doing stuff during day and didn't want to impact that too much.  Lungs clear. No c/p presently.   Will f/u next week.  Sent message to triage staff.     BP (!) 154/86   Pulse 98   Resp 18   Wt 147 lb (66.7 kg)   LMP  (LMP Unknown)   SpO2 99%   BMI 31.81 kg/m  Weight yesterday-147  Last visit weight-147  Patient Care Team: PaElinor Parkinsons PCP - General (Physician Assistant) BaJacelyn PiMD as PCP - Endocrinology (Endocrinology)  Patient Active Problem List   Diagnosis Date Noted   Heart failure (HCH[place]09/10/2021   Acute on chronic systolic CHF (congestive heart failure) (HCTehama09/09/2021   Elevated troponin 12/02/2021   Pancytopenia (HCNew Paris09/09/2021   Amenorrhea 12/02/2021   Hyperosmolar hyperglycemic state (HHS) (HCMaroa06/03/2021   Small intestinal bacterial overgrowth (SIBO) 08/26/2021   Lower extremity edema 08/26/2021   Hyperkalemia 08/14/2021   Hx of insulin dependent diabetes mellitus 08/14/2021   Acute kidney injury superimposed on chronic kidney disease (HCCleveland 05/13/2021   Chest pain 03/10/2021   Acute on chronic combined systolic and diastolic CHF (congestive heart failure) (HCBon Air11/15/2022   History of astrocytoma of brain 02/09/2021   Severe hyperglycemia due to diabetes mellitus (HCCambridge10/31/2022   Diarrhea    Elevated transaminase level    Acute combined systolic and diastolic heart failure (HCC)    Nonischemic cardiomyopathy (HCC)    Acute decompensated heart failure (HCC)    Elevated liver enzymes    Acute renal failure superimposed on stage 3a chronic kidney disease (HCSylvan [place]09/03/2020   Abdominal pain 0837/16/9678 Chronic systolic CHF (congestive heart failure) (HCGlendale08/29/2022   Essential hypertension 11/23/2020   Hypertriglyceridemia 11/23/2020   Type 2 diabetes mellitus with hyperlipidemia (HCMount Wolf07/15/2020   Hypothyroidism 10/10/2018    Current Outpatient Medications:    albuterol (VENTOLIN HFA) 108 (90 Base) MCG/ACT inhaler, Inhale 1-2 puffs into the lungs every 6 (six) hours as needed for wheezing or shortness of breath., Disp: , Rfl:    aspirin EC 81 MG EC tablet, Take 1 tablet (81 mg total) by mouth daily. Swallow whole. (Patient taking differently: Take 81 mg by mouth every evening. Swallow whole.), Disp: 30 tablet, Rfl: 1   erythromycin ophthalmic ointment, , Disp: , Rfl:    Evolocumab (REPATHA) 140 MG/ML SOSY, Inject 140 mg into the skin See admin instructions. Inject 140  mg subcutaneously every other Wednesday evening, Disp: , Rfl:    fenofibrate 160 MG tablet, Take 1 tablet (160 mg total) by mouth daily., Disp: 30 tablet, Rfl: 1   hydrALAZINE (APRESOLINE) 25 MG tablet, Take 1 tablet (25 mg total) by mouth 3 (three) times daily., Disp: 90 tablet, Rfl: 3   insulin regular human CONCENTRATED (HUMULIN R) 500 UNIT/ML injection, Inject 0.1-0.2 mLs (50-100 Units total) into the skin See admin instructions. 100 units with breakfast and lunch  50units at bedtime, Disp: 20 mL, Rfl: 0   ivabradine (CORLANOR) 7.5 MG TABS tablet, Take 1  tablet (7.5 mg total) by mouth 2 (two) times daily with a meal., Disp: 60 tablet, Rfl: 6   levothyroxine (SYNTHROID) 125 MCG tablet, Take 1 tablet (125 mcg total) by mouth daily at 6 (six) AM., Disp: 30 tablet, Rfl: 1   medroxyPROGESTERone (PROVERA) 10 MG tablet, Take 10 mg by mouth daily., Disp: , Rfl:    ofloxacin (OCUFLOX) 0.3 % ophthalmic solution, Place 1 drop into both eyes daily as needed (only take after eye injections per patient)., Disp: , Rfl:    ondansetron (ZOFRAN) 4 MG tablet, Take 4 mg by mouth every 8 (eight) hours as needed for nausea or vomiting., Disp: , Rfl:    pantoprazole (PROTONIX) 40 MG tablet, Take 40 mg by mouth daily., Disp: , Rfl:    pregabalin (LYRICA) 150 MG capsule, Take 1 capsule (150 mg total) by mouth at bedtime. (Patient taking differently: Take 300 mg by mouth at bedtime.), Disp: , Rfl:    prochlorperazine (COMPAZINE) 5 MG tablet, Take 5 mg by mouth every 6 (six) hours as needed for nausea or vomiting., Disp: , Rfl:    rosuvastatin (CRESTOR) 40 MG tablet, Take 40 mg by mouth daily., Disp: , Rfl:    sildenafil (REVATIO) 20 MG tablet, Take 1 tablet (20 mg total) by mouth 3 (three) times daily., Disp: 90 tablet, Rfl: 3   torsemide (DEMADEX) 20 MG tablet, Take 4 tablets (80 mg total) by mouth daily. (Patient taking differently: Take 80 mg by mouth daily.), Disp: 120 tablet, Rfl: 6   tretinoin (RETIN-A) 0.05 % cream, Apply 1 application topically at bedtime., Disp: , Rfl:    dicyclomine (BENTYL) 10 MG capsule, Take 10 mg by mouth daily. (Patient not taking: Reported on 12/15/2021), Disp: , Rfl:    DULoxetine (CYMBALTA) 30 MG capsule, Take 30 mg by mouth at bedtime., Disp: , Rfl:    estradiol (ESTRACE) 0.5 MG tablet, Take 0.5 mg by mouth daily. (Patient not taking: Reported on 12/30/2021), Disp: , Rfl:    [name] 1/20 1-20 MG-MCG tablet, Take 1 tablet by mouth daily. (Patient not taking: Reported on 12/15/2021), Disp: , Rfl:    Potassium Chloride ER 20 MEQ TBCR, Take 40  mEq by mouth once a week. (Patient not taking: Reported on 12/15/2021), Disp: , Rfl:  Allergies  Allergen Reactions   Ketamine Other (See Comments)    In vegetative state for 15 minutes per pt   Erythromycin    Fentanyl Nausea And Vomiting   Maitake Mushroom [Maitake] Itching    Itchy throat   Penicillin V Itching    Gi upset   Shellfish Allergy Other (See Comments)    Pt has never had shellfish but tested positive on allergy test. Pt states contrast in CT is okay   Morphine Itching and Rash   Penicillins Rash    Has patient had a PCN reaction causing immediate rash, facial/tongue/throat swelling, SOB or  lightheadedness with hypotension: Y Has patient had a PCN reaction causing severe rash involving mucus membranes or skin necrosis: Y Has patient had a PCN reaction that required hospitalization: N Has patient had a PCN reaction occurring within the last 10 years: Y If all of the above answers are "NO", then may proceed with Cephalosporin use.    Prednisone Rash      Social History   Socioeconomic History   Marital status: Married    Spouse name: Not on file   Number of children: 0   Years of education: Not on file   Highest education level: Not on file  Occupational History   Occupation: [employer]  Tobacco Use   Smoking status: Never   Smokeless tobacco: Never  Vaping Use   Vaping Use: Never used  Substance and Sexual Activity   Alcohol use: Never   Drug use: Never   Sexual activity: Yes  Other Topics Concern   Not on file  Social History Narrative   Not on file   Social Determinants of Health   Financial Resource Strain: Low Risk  (12/10/2021)   Overall Financial Resource Strain (CARDIA)    Difficulty of Paying Living Expenses: Not hard at all  Food Insecurity: No Food Insecurity (12/03/2021)   Hunger Vital Sign    Worried About Running Out of Food in the Last Year: Never true    [place] in the Last Year: Never true  Transportation Needs: No  Transportation Needs (12/03/2021)   PRAPARE - Hydrologist (Medical): No    Lack of Transportation (Non-Medical): No  Physical Activity: Sufficiently Active (12/10/2021)   Exercise Vital Sign    Days of Exercise per Week: 3 days    Minutes of Exercise per Session: 60 min  Stress: No Stress Concern Present (12/10/2021)   [place]    Feeling of Stress : Not at all  Social Connections: [place] (12/10/2021)   Social Connection and Isolation Panel [NHANES]    Frequency of Communication with Friends and Family: More than three times a week    Frequency of Social Gatherings with Friends and Family: More than three times a week    Attends Religious Services: More than 4 times per year    Active Member of Clubs or Organizations: No    Attends Music therapist: More than 4 times per year    Marital Status: Married  Human resources officer Violence: Not At Risk (12/03/2021)   Humiliation, Afraid, Rape, and Kick questionnaire    Fear of Current or Ex-Partner: No    Emotionally Abused: No    Physically Abused: No    Sexually Abused: No    Physical Exam      Future Appointments  Date Time Provider [place]  01/26/2022 10:00 AM [name], Shaune Pascal, MD [place] None       [name], [place] Paramedic  12/30/21

## 2022-01-06 ENCOUNTER — Other Ambulatory Visit (HOSPITAL_COMMUNITY): Payer: Self-pay

## 2022-01-06 NOTE — Progress Notes (Signed)
Paramedicine Encounter    Patient ID: Jennifer [name], female    DOB: Aug 02, 1990, 31 y.o.   MRN: [id number]   Pt reports she is doing ok, she has started this new diet called "gut health" she cheated on diet yesterday and ate cook out and got really bloated after that and felt miserable. She then took torsemide and urinated a lot last night and felt better and feels much less bloated.  She got denied disability. She reports she cannot do much activity without getting c/p.  Checked her cardiomems today and it was on goal.  Her weight is down 2 lbs from last week.  She just took her meds at 9am. I am here at 935.  She reports good news from her kidney biopsy.  She was told to hold her asa for 1 wk post biopsy-so she will restart in the next 2 days.  Meds verified and everything was correct-she has filled pill box up herself.  She has to order rosuvastatin for refills.  Will f/u next week. I also sent triage message that she has been having these c/p at activity.   BP 122/82   Pulse 100   Resp 16   Wt 145 lb (65.8 kg)   LMP  (LMP Unknown)   SpO2 98%   BMI 31.38 kg/m  Weight yesterday-145 Last visit weight-147  Patient Care Team: Elinor Parkinson as PCP - General (Physician Assistant) Jacelyn Pi, MD as PCP - Endocrinology (Endocrinology)  Patient Active Problem List   Diagnosis Date Noted   Heart failure ([place]) 12/03/2021   Acute on chronic systolic CHF (congestive heart failure) ([place]) 12/02/2021   Elevated troponin 12/02/2021   Pancytopenia ([place]) 12/02/2021   Amenorrhea 12/02/2021   Hyperosmolar hyperglycemic state (HHS) ([place]) 08/26/2021   Small intestinal bacterial overgrowth (SIBO) 08/26/2021   Lower extremity edema 08/26/2021   Hyperkalemia 08/14/2021   Hx of insulin dependent diabetes mellitus 08/14/2021   Acute kidney injury superimposed on chronic kidney disease ([place]) 05/13/2021   Chest pain 03/10/2021   Acute on chronic combined systolic and diastolic CHF  (congestive heart failure) ([place]) 02/09/2021   History of astrocytoma of brain 02/09/2021   Severe hyperglycemia due to diabetes mellitus ([place]) 01/25/2021   Diarrhea    Elevated transaminase level    Acute combined systolic and diastolic heart failure (HCC)    Nonischemic cardiomyopathy (HCC)    Acute decompensated heart failure (HCC)    Elevated liver enzymes    Acute renal failure superimposed on stage 3a chronic kidney disease ([place]) 11/26/2020   Abdominal pain 45/99/7741   Chronic systolic CHF (congestive heart failure) ([place]) 11/23/2020   Essential hypertension 11/23/2020   Hypertriglyceridemia 11/23/2020   Type 2 diabetes mellitus with hyperlipidemia ([place]) 10/10/2018   Hypothyroidism 10/10/2018    Current Outpatient Medications:    albuterol (VENTOLIN HFA) 108 (90 Base) MCG/ACT inhaler, Inhale 1-2 puffs into the lungs every 6 (six) hours as needed for wheezing or shortness of breath., Disp: , Rfl:    aspirin EC 81 MG EC tablet, Take 1 tablet (81 mg total) by mouth daily. Swallow whole. (Patient taking differently: Take 81 mg by mouth every evening. Swallow whole.), Disp: 30 tablet, Rfl: 1   erythromycin ophthalmic ointment, , Disp: , Rfl:    Evolocumab (REPATHA) 140 MG/ML SOSY, Inject 140 mg into the skin See admin instructions. Inject 140 mg subcutaneously every other Wednesday evening, Disp: , Rfl:    fenofibrate 160 MG tablet, Take 1 tablet (160 mg total)  by mouth daily., Disp: 30 tablet, Rfl: 1   hydrALAZINE (APRESOLINE) 25 MG tablet, Take 1 tablet (25 mg total) by mouth 3 (three) times daily., Disp: 90 tablet, Rfl: 3   insulin regular human CONCENTRATED (HUMULIN R) 500 UNIT/ML injection, Inject 0.1-0.2 mLs (50-100 Units total) into the skin See admin instructions. 100 units with breakfast and lunch  50units at bedtime, Disp: 20 mL, Rfl: 0   ivabradine (CORLANOR) 7.5 MG TABS tablet, Take 1 tablet (7.5 mg total) by mouth 2 (two) times daily with a meal., Disp: 60 tablet, Rfl: 6    levothyroxine (SYNTHROID) 125 MCG tablet, Take 1 tablet (125 mcg total) by mouth daily at 6 (six) AM., Disp: 30 tablet, Rfl: 1   ondansetron (ZOFRAN) 4 MG tablet, Take 4 mg by mouth every 8 (eight) hours as needed for nausea or vomiting., Disp: , Rfl:    pantoprazole (PROTONIX) 40 MG tablet, Take 40 mg by mouth daily., Disp: , Rfl:    pregabalin (LYRICA) 150 MG capsule, Take 1 capsule (150 mg total) by mouth at bedtime. (Patient taking differently: Take 300 mg by mouth at bedtime.), Disp: , Rfl:    prochlorperazine (COMPAZINE) 5 MG tablet, Take 5 mg by mouth every 6 (six) hours as needed for nausea or vomiting., Disp: , Rfl:    rosuvastatin (CRESTOR) 40 MG tablet, Take 40 mg by mouth daily., Disp: , Rfl:    sildenafil (REVATIO) 20 MG tablet, Take 1 tablet (20 mg total) by mouth 3 (three) times daily., Disp: 90 tablet, Rfl: 3   torsemide (DEMADEX) 20 MG tablet, Take 4 tablets (80 mg total) by mouth daily. (Patient taking differently: Take 80 mg by mouth daily.), Disp: 120 tablet, Rfl: 6   tretinoin (RETIN-A) 0.05 % cream, Apply 1 application topically at bedtime., Disp: , Rfl:    dicyclomine (BENTYL) 10 MG capsule, Take 10 mg by mouth daily. (Patient not taking: Reported on 12/15/2021), Disp: , Rfl:    DULoxetine (CYMBALTA) 30 MG capsule, Take 30 mg by mouth at bedtime., Disp: , Rfl:    estradiol (ESTRACE) 0.5 MG tablet, Take 0.5 mg by mouth daily. (Patient not taking: Reported on 12/30/2021), Disp: , Rfl:    [name] 1/20 1-20 MG-MCG tablet, Take 1 tablet by mouth daily. (Patient not taking: Reported on 12/15/2021), Disp: , Rfl:    medroxyPROGESTERone (PROVERA) 10 MG tablet, Take 10 mg by mouth daily. (Patient not taking: Reported on 01/06/2022), Disp: , Rfl:    ofloxacin (OCUFLOX) 0.3 % ophthalmic solution, Place 1 drop into both eyes daily as needed (only take after eye injections per patient)., Disp: , Rfl:    Potassium Chloride ER 20 MEQ TBCR, Take 40 mEq by mouth once a week. (Patient not taking:  Reported on 12/15/2021), Disp: , Rfl:  Allergies  Allergen Reactions   Ketamine Other (See Comments)    In vegetative state for 15 minutes per pt   Erythromycin    Fentanyl Nausea And Vomiting   Maitake Mushroom [Maitake] Itching    Itchy throat   Penicillin V Itching    Gi upset   Shellfish Allergy Other (See Comments)    Pt has never had shellfish but tested positive on allergy test. Pt states contrast in CT is okay   Morphine Itching and Rash   Penicillins Rash    Has patient had a PCN reaction causing immediate rash, facial/tongue/throat swelling, SOB or lightheadedness with hypotension: Y Has patient had a PCN reaction causing severe rash involving mucus membranes  or skin necrosis: Y Has patient had a PCN reaction that required hospitalization: N Has patient had a PCN reaction occurring within the last 10 years: Y If all of the above answers are "NO", then may proceed with Cephalosporin use.    Prednisone Rash      Social History   Socioeconomic History   Marital status: Married    Spouse name: Not on file   Number of children: 0   Years of education: Not on file   Highest education level: Not on file  Occupational History   Occupation: [employer]  Tobacco Use   Smoking status: Never   Smokeless tobacco: Never  Vaping Use   Vaping Use: Never used  Substance and Sexual Activity   Alcohol use: Never   Drug use: Never   Sexual activity: Yes  Other Topics Concern   Not on file  Social History Narrative   Not on file   Social Determinants of Health   Financial Resource Strain: Low Risk  (12/10/2021)   Overall Financial Resource Strain (CARDIA)    Difficulty of Paying Living Expenses: Not hard at all  Food Insecurity: No Food Insecurity (12/03/2021)   Hunger Vital Sign    Worried About Running Out of Food in the Last Year: Never true    [place] in the Last Year: Never true  Transportation Needs: No Transportation Needs (12/03/2021)   PRAPARE -  Hydrologist (Medical): No    Lack of Transportation (Non-Medical): No  Physical Activity: Sufficiently Active (12/10/2021)   Exercise Vital Sign    Days of Exercise per Week: 3 days    Minutes of Exercise per Session: 60 min  Stress: No Stress Concern Present (12/10/2021)   [place]    Feeling of Stress : Not at all  Social Connections: [place] (12/10/2021)   Social Connection and Isolation Panel [NHANES]    Frequency of Communication with Friends and Family: More than three times a week    Frequency of Social Gatherings with Friends and Family: More than three times a week    Attends Religious Services: More than 4 times per year    Active Member of Clubs or Organizations: No    Attends Music therapist: More than 4 times per year    Marital Status: Married  Human resources officer Violence: Not At Risk (12/03/2021)   Humiliation, Afraid, Rape, and Kick questionnaire    Fear of Current or Ex-Partner: No    Emotionally Abused: No    Physically Abused: No    Sexually Abused: No    Physical Exam      Future Appointments  Date Time Provider [place]  01/26/2022 10:00 AM [name], Shaune Pascal, MD [place] None       [name], [place] Paramedic  01/06/22

## 2022-01-12 ENCOUNTER — Other Ambulatory Visit (HOSPITAL_COMMUNITY): Payer: Self-pay

## 2022-01-12 NOTE — Progress Notes (Addendum)
Paramedicine Encounter    Patient ID: Jennifer [name], female    DOB: 01/17/1991, 31 y.o.   MRN: [id number]  Pt reports she is doing ok. She denies increased sob, the c/p did go away that she was having last week.  Her cardiomems readings were higher the past few days but are back down now.  She denies dizziness.  Meds verified. She filled up pill box and everything is correct  Need to f/u on the outpt sleep study per notes in 9/13.  Sent triage message about f/u with this.  No edema noted.  Lungs clear.    BP 130/76   Pulse (!) 104   Resp 16   Wt 147 lb (66.7 kg)   LMP  (LMP Unknown)   SpO2 98%   BMI 31.81 kg/m  Weight yesterday-147  Last visit weight-145  Patient Care Team: Elinor Parkinson as PCP - General (Physician Assistant) Jacelyn Pi, MD as PCP - Endocrinology (Endocrinology)  Patient Active Problem List   Diagnosis Date Noted   Heart failure ([place]) 12/03/2021   Acute on chronic systolic CHF (congestive heart failure) ([place]) 12/02/2021   Elevated troponin 12/02/2021   Pancytopenia ([place]) 12/02/2021   Amenorrhea 12/02/2021   Hyperosmolar hyperglycemic state (HHS) ([place]) 08/26/2021   Small intestinal bacterial overgrowth (SIBO) 08/26/2021   Lower extremity edema 08/26/2021   Hyperkalemia 08/14/2021   Hx of insulin dependent diabetes mellitus 08/14/2021   Acute kidney injury superimposed on chronic kidney disease ([place]) 05/13/2021   Chest pain 03/10/2021   Acute on chronic combined systolic and diastolic CHF (congestive heart failure) ([place]) 02/09/2021   History of astrocytoma of brain 02/09/2021   Severe hyperglycemia due to diabetes mellitus ([place]) 01/25/2021   Diarrhea    Elevated transaminase level    Acute combined systolic and diastolic heart failure (HCC)    Nonischemic cardiomyopathy (HCC)    Acute decompensated heart failure (HCC)    Elevated liver enzymes    Acute renal failure superimposed on stage 3a chronic kidney disease ([place]) 11/26/2020    Abdominal pain 05/39/7673   Chronic systolic CHF (congestive heart failure) ([place]) 11/23/2020   Essential hypertension 11/23/2020   Hypertriglyceridemia 11/23/2020   Type 2 diabetes mellitus with hyperlipidemia ([place]) 10/10/2018   Hypothyroidism 10/10/2018    Current Outpatient Medications:    albuterol (VENTOLIN HFA) 108 (90 Base) MCG/ACT inhaler, Inhale 1-2 puffs into the lungs every 6 (six) hours as needed for wheezing or shortness of breath., Disp: , Rfl:    aspirin EC 81 MG EC tablet, Take 1 tablet (81 mg total) by mouth daily. Swallow whole. (Patient taking differently: Take 81 mg by mouth every evening. Swallow whole.), Disp: 30 tablet, Rfl: 1   dicyclomine (BENTYL) 10 MG capsule, Take 10 mg by mouth daily. (Patient not taking: Reported on 12/15/2021), Disp: , Rfl:    DULoxetine (CYMBALTA) 30 MG capsule, Take 30 mg by mouth at bedtime., Disp: , Rfl:    erythromycin ophthalmic ointment, , Disp: , Rfl:    estradiol (ESTRACE) 0.5 MG tablet, Take 0.5 mg by mouth daily. (Patient not taking: Reported on 12/30/2021), Disp: , Rfl:    Evolocumab (REPATHA) 140 MG/ML SOSY, Inject 140 mg into the skin See admin instructions. Inject 140 mg subcutaneously every other Wednesday evening, Disp: , Rfl:    fenofibrate 160 MG tablet, Take 1 tablet (160 mg total) by mouth daily., Disp: 30 tablet, Rfl: 1   [name] 1/20 1-20 MG-MCG tablet, Take 1 tablet by mouth daily. (Patient  not taking: Reported on 12/15/2021), Disp: , Rfl:    hydrALAZINE (APRESOLINE) 25 MG tablet, Take 1 tablet (25 mg total) by mouth 3 (three) times daily., Disp: 90 tablet, Rfl: 3   insulin regular human CONCENTRATED (HUMULIN R) 500 UNIT/ML injection, Inject 0.1-0.2 mLs (50-100 Units total) into the skin See admin instructions. 100 units with breakfast and lunch  50units at bedtime, Disp: 20 mL, Rfl: 0   ivabradine (CORLANOR) 7.5 MG TABS tablet, Take 1 tablet (7.5 mg total) by mouth 2 (two) times daily with a meal., Disp: 60 tablet, Rfl: 6    levothyroxine (SYNTHROID) 125 MCG tablet, Take 1 tablet (125 mcg total) by mouth daily at 6 (six) AM., Disp: 30 tablet, Rfl: 1   medroxyPROGESTERone (PROVERA) 10 MG tablet, Take 10 mg by mouth daily. (Patient not taking: Reported on 01/06/2022), Disp: , Rfl:    ofloxacin (OCUFLOX) 0.3 % ophthalmic solution, Place 1 drop into both eyes daily as needed (only take after eye injections per patient)., Disp: , Rfl:    ondansetron (ZOFRAN) 4 MG tablet, Take 4 mg by mouth every 8 (eight) hours as needed for nausea or vomiting., Disp: , Rfl:    pantoprazole (PROTONIX) 40 MG tablet, Take 40 mg by mouth daily., Disp: , Rfl:    Potassium Chloride ER 20 MEQ TBCR, Take 40 mEq by mouth once a week. (Patient not taking: Reported on 12/15/2021), Disp: , Rfl:    pregabalin (LYRICA) 150 MG capsule, Take 1 capsule (150 mg total) by mouth at bedtime. (Patient taking differently: Take 300 mg by mouth at bedtime.), Disp: , Rfl:    prochlorperazine (COMPAZINE) 5 MG tablet, Take 5 mg by mouth every 6 (six) hours as needed for nausea or vomiting., Disp: , Rfl:    rosuvastatin (CRESTOR) 40 MG tablet, Take 40 mg by mouth daily., Disp: , Rfl:    sildenafil (REVATIO) 20 MG tablet, Take 1 tablet (20 mg total) by mouth 3 (three) times daily., Disp: 90 tablet, Rfl: 3   torsemide (DEMADEX) 20 MG tablet, Take 4 tablets (80 mg total) by mouth daily. (Patient taking differently: Take 80 mg by mouth daily.), Disp: 120 tablet, Rfl: 6   tretinoin (RETIN-A) 0.05 % cream, Apply 1 application topically at bedtime., Disp: , Rfl:  Allergies  Allergen Reactions   Ketamine Other (See Comments)    In vegetative state for 15 minutes per pt   Erythromycin    Fentanyl Nausea And Vomiting   Maitake Mushroom [Maitake] Itching    Itchy throat   Penicillin V Itching    Gi upset   Shellfish Allergy Other (See Comments)    Pt has never had shellfish but tested positive on allergy test. Pt states contrast in CT is okay   Morphine Itching and Rash    Penicillins Rash    Has patient had a PCN reaction causing immediate rash, facial/tongue/throat swelling, SOB or lightheadedness with hypotension: Y Has patient had a PCN reaction causing severe rash involving mucus membranes or skin necrosis: Y Has patient had a PCN reaction that required hospitalization: N Has patient had a PCN reaction occurring within the last 10 years: Y If all of the above answers are "NO", then may proceed with Cephalosporin use.    Prednisone Rash      Social History   Socioeconomic History   Marital status: Married    Spouse name: Not on file   Number of children: 0   Years of education: Not on file   Highest  education level: Not on file  Occupational History   Occupation: [employer]  Tobacco Use   Smoking status: Never   Smokeless tobacco: Never  Vaping Use   Vaping Use: Never used  Substance and Sexual Activity   Alcohol use: Never   Drug use: Never   Sexual activity: Yes  Other Topics Concern   Not on file  Social History Narrative   Not on file   Social Determinants of Health   Financial Resource Strain: Low Risk  (12/10/2021)   Overall Financial Resource Strain (CARDIA)    Difficulty of Paying Living Expenses: Not hard at all  Food Insecurity: No Food Insecurity (12/03/2021)   Hunger Vital Sign    Worried About Running Out of Food in the Last Year: Never true    Ran Out of Food in the Last Year: Never true  Transportation Needs: No Transportation Needs (12/03/2021)   PRAPARE - Hydrologist (Medical): No    Lack of Transportation (Non-Medical): No  Physical Activity: Sufficiently Active (12/10/2021)   Exercise Vital Sign    Days of Exercise per Week: 3 days    Minutes of Exercise per Session: 60 min  Stress: No Stress Concern Present (12/10/2021)   [place]    Feeling of Stress : Not at all  Social Connections: [place] (12/10/2021)    Social Connection and Isolation Panel [NHANES]    Frequency of Communication with Friends and Family: More than three times a week    Frequency of Social Gatherings with Friends and Family: More than three times a week    Attends Religious Services: More than 4 times per year    Active Member of Clubs or Organizations: No    Attends Music therapist: More than 4 times per year    Marital Status: Married  Human resources officer Violence: Not At Risk (12/03/2021)   Humiliation, Afraid, Rape, and Kick questionnaire    Fear of Current or Ex-Partner: No    Emotionally Abused: No    Physically Abused: No    Sexually Abused: No    Physical Exam      Future Appointments  Date Time Provider [place]  01/26/2022 10:00 AM [name], Shaune Pascal, MD [place] None       [name], [place] Paramedic  01/12/22

## 2022-01-19 ENCOUNTER — Other Ambulatory Visit (HOSPITAL_COMMUNITY): Payer: Self-pay

## 2022-01-19 NOTE — Progress Notes (Signed)
Paramedicine Encounter    Patient ID: Jennifer [name], female    DOB: 1990-10-05, 31 y.o.   MRN: [id number]  Pt reports she is feeling ok. No increased sob, no dizziness, no c/p, no palpitations.  Her cardiomems today looked below goal-the other day she was above goal but her weight is stable.  Her abd is very distended. No symptoms.   Meds verified and pill box checked.  She had a few extra torsemide in there but it was corrected.   Confirmed with her upcoming appointments.  B/p elevated-she just took her mid dose of meds about an hour prior to my arrival.    BP (!) 140/82   Pulse 90   Resp 16   Wt 147 lb (66.7 kg)   SpO2 98%   BMI 31.81 kg/m  Weight yesterday-146 Last visit weight-147  Patient Care Team: Elinor Parkinson as PCP - General (Physician Assistant) Jacelyn Pi, MD as PCP - Endocrinology (Endocrinology)  Patient Active Problem List   Diagnosis Date Noted  . Heart failure ([place]) 12/03/2021  . Acute on chronic systolic CHF (congestive heart failure) ([place]) 12/02/2021  . Elevated troponin 12/02/2021  . Pancytopenia ([place]) 12/02/2021  . Amenorrhea 12/02/2021  . Hyperosmolar hyperglycemic state (HHS) ([place]) 08/26/2021  . Small intestinal bacterial overgrowth (SIBO) 08/26/2021  . Lower extremity edema 08/26/2021  . Hyperkalemia 08/14/2021  . Hx of insulin dependent diabetes mellitus 08/14/2021  . Acute kidney injury superimposed on chronic kidney disease ([place]) 05/13/2021  . Chest pain 03/10/2021  . Acute on chronic combined systolic and diastolic CHF (congestive heart failure) ([place]) 02/09/2021  . History of astrocytoma of brain 02/09/2021  . Severe hyperglycemia due to diabetes mellitus ([place]) 01/25/2021  . Diarrhea   . Elevated transaminase level   . Acute combined systolic and diastolic heart failure ([place])   . Nonischemic cardiomyopathy ([place])   . Acute decompensated heart failure ([place])   . Elevated liver enzymes   . Acute renal failure superimposed on  stage 3a chronic kidney disease ([place]) 11/26/2020  . Abdominal pain 11/23/2020  . Chronic systolic CHF (congestive heart failure) ([place]) 11/23/2020  . Essential hypertension 11/23/2020  . Hypertriglyceridemia 11/23/2020  . Type 2 diabetes mellitus with hyperlipidemia ([place]) 10/10/2018  . Hypothyroidism 10/10/2018    Current Outpatient Medications:  .  aspirin EC 81 MG EC tablet, Take 1 tablet (81 mg total) by mouth daily. Swallow whole. (Patient taking differently: Take 81 mg by mouth every evening. Swallow whole.), Disp: 30 tablet, Rfl: 1 .  erythromycin ophthalmic ointment, , Disp: , Rfl:  .  Evolocumab (REPATHA) 140 MG/ML SOSY, Inject 140 mg into the skin See admin instructions. Inject 140 mg subcutaneously every other Wednesday evening, Disp: , Rfl:  .  fenofibrate 160 MG tablet, Take 1 tablet (160 mg total) by mouth daily., Disp: 30 tablet, Rfl: 1 .  hydrALAZINE (APRESOLINE) 25 MG tablet, Take 1 tablet (25 mg total) by mouth 3 (three) times daily., Disp: 90 tablet, Rfl: 3 .  insulin regular human CONCENTRATED (HUMULIN R) 500 UNIT/ML injection, Inject 0.1-0.2 mLs (50-100 Units total) into the skin See admin instructions. 100 units with breakfast and lunch  50units at bedtime, Disp: 20 mL, Rfl: 0 .  ivabradine (CORLANOR) 7.5 MG TABS tablet, Take 1 tablet (7.5 mg total) by mouth 2 (two) times daily with a meal., Disp: 60 tablet, Rfl: 6 .  levothyroxine (SYNTHROID) 125 MCG tablet, Take 1 tablet (125 mcg total) by mouth daily at 6 (six) AM., Disp: 30  tablet, Rfl: 1 .  loratadine (CLARITIN) 10 MG tablet, Take 10 mg by mouth daily., Disp: , Rfl:  .  pantoprazole (PROTONIX) 40 MG tablet, Take 40 mg by mouth daily., Disp: , Rfl:  .  pregabalin (LYRICA) 150 MG capsule, Take 1 capsule (150 mg total) by mouth at bedtime. (Patient taking differently: Take 300 mg by mouth at bedtime.), Disp: , Rfl:  .  prochlorperazine (COMPAZINE) 5 MG tablet, Take 5 mg by mouth every 6 (six) hours as needed for nausea or  vomiting., Disp: , Rfl:  .  rosuvastatin (CRESTOR) 40 MG tablet, Take 40 mg by mouth daily., Disp: , Rfl:  .  sildenafil (REVATIO) 20 MG tablet, Take 1 tablet (20 mg total) by mouth 3 (three) times daily., Disp: 90 tablet, Rfl: 3 .  torsemide (DEMADEX) 20 MG tablet, Take 4 tablets (80 mg total) by mouth daily., Disp: 120 tablet, Rfl: 6 .  tretinoin (RETIN-A) 0.05 % cream, Apply 1 application topically at bedtime., Disp: , Rfl:  .  albuterol (VENTOLIN HFA) 108 (90 Base) MCG/ACT inhaler, Inhale 1-2 puffs into the lungs every 6 (six) hours as needed for wheezing or shortness of breath., Disp: , Rfl:  .  dicyclomine (BENTYL) 10 MG capsule, Take 10 mg by mouth daily. (Patient not taking: Reported on 12/15/2021), Disp: , Rfl:  .  DULoxetine (CYMBALTA) 30 MG capsule, Take 30 mg by mouth at bedtime., Disp: , Rfl:  .  estradiol (ESTRACE) 0.5 MG tablet, Take 0.5 mg by mouth daily. (Patient not taking: Reported on 12/30/2021), Disp: , Rfl:  .  [name] 1/20 1-20 MG-MCG tablet, Take 1 tablet by mouth daily. (Patient not taking: Reported on 12/15/2021), Disp: , Rfl:  .  medroxyPROGESTERone (PROVERA) 10 MG tablet, Take 10 mg by mouth daily. (Patient not taking: Reported on 01/06/2022), Disp: , Rfl:  .  ofloxacin (OCUFLOX) 0.3 % ophthalmic solution, Place 1 drop into both eyes daily as needed (only take after eye injections per patient)., Disp: , Rfl:  .  ondansetron (ZOFRAN) 4 MG tablet, Take 4 mg by mouth every 8 (eight) hours as needed for nausea or vomiting., Disp: , Rfl:  .  Potassium Chloride ER 20 MEQ TBCR, Take 40 mEq by mouth once a week. (Patient not taking: Reported on 12/15/2021), Disp: , Rfl:  Allergies  Allergen Reactions  . Ketamine Other (See Comments)    In vegetative state for 15 minutes per pt  . Erythromycin   . Fentanyl Nausea And Vomiting  . Maitake Mushroom [Maitake] Itching    Itchy throat  . Penicillin V Itching    Gi upset  . Shellfish Allergy Other (See Comments)    Pt has never had  shellfish but tested positive on allergy test. Pt states contrast in CT is okay  . Morphine Itching and Rash  . Penicillins Rash    Has patient had a PCN reaction causing immediate rash, facial/tongue/throat swelling, SOB or lightheadedness with hypotension: Y Has patient had a PCN reaction causing severe rash involving mucus membranes or skin necrosis: Y Has patient had a PCN reaction that required hospitalization: N Has patient had a PCN reaction occurring within the last 10 years: Y If all of the above answers are "NO", then may proceed with Cephalosporin use.   . Prednisone Rash      Social History   Socioeconomic History  . Marital status: Married    Spouse name: Not on file  . Number of children: 0  . Years of  education: Not on file  . Highest education level: Not on file  Occupational History  . Occupation: [employer]  Tobacco Use  . Smoking status: Never  . Smokeless tobacco: Never  Vaping Use  . Vaping Use: Never used  Substance and Sexual Activity  . Alcohol use: Never  . Drug use: Never  . Sexual activity: Yes  Other Topics Concern  . Not on file  Social History Narrative  . Not on file   Social Determinants of Health   Financial Resource Strain: Low Risk  (12/10/2021)   Overall Financial Resource Strain (CARDIA)   . Difficulty of Paying Living Expenses: Not hard at all  Food Insecurity: No Food Insecurity (12/03/2021)   Hunger Vital Sign   . Worried About Charity fundraiser in the Last Year: Never true   . Ran Out of Food in the Last Year: Never true  Transportation Needs: No Transportation Needs (12/03/2021)   PRAPARE - Transportation   . Lack of Transportation (Medical): No   . Lack of Transportation (Non-Medical): No  Physical Activity: Sufficiently Active (12/10/2021)   Exercise Vital Sign   . Days of Exercise per Week: 3 days   . Minutes of Exercise per Session: 60 min  Stress: No Stress Concern Present (12/10/2021)   [place]   . Feeling of Stress : Not at all  Social Connections: Socially Integrated (12/10/2021)   Social Connection and Isolation Panel [NHANES]   . Frequency of Communication with Friends and Family: More than three times a week   . Frequency of Social Gatherings with Friends and Family: More than three times a week   . Attends Religious Services: More than 4 times per year   . Active Member of Clubs or Organizations: No   . Attends Archivist Meetings: More than 4 times per year   . Marital Status: Married  Human resources officer Violence: Not At Risk (12/03/2021)   Humiliation, Afraid, Rape, and Kick questionnaire   . Fear of Current or Ex-Partner: No   . Emotionally Abused: No   . Physically Abused: No   . Sexually Abused: No    Physical Exam      Future Appointments  Date Time Provider [name]  01/26/2022 10:00 AM [name], Shaune Pascal, MD [place] None       [name], [place] Paramedic  01/19/22

## 2022-01-21 ENCOUNTER — Encounter (HOSPITAL_BASED_OUTPATIENT_CLINIC_OR_DEPARTMENT_OTHER): Payer: Self-pay

## 2022-01-21 ENCOUNTER — Other Ambulatory Visit: Payer: Self-pay

## 2022-01-21 DIAGNOSIS — L02415 Cutaneous abscess of right lower limb: Secondary | ICD-10-CM | POA: Diagnosis present

## 2022-01-21 DIAGNOSIS — Z7982 Long term (current) use of aspirin: Secondary | ICD-10-CM | POA: Insufficient documentation

## 2022-01-21 NOTE — ED Triage Notes (Signed)
C/o "cyst" between right leg and perineum

## 2022-01-22 ENCOUNTER — Emergency Department (HOSPITAL_BASED_OUTPATIENT_CLINIC_OR_DEPARTMENT_OTHER)
Admission: EM | Admit: 2022-01-22 | Discharge: 2022-01-22 | Disposition: A | Discharge status: Home / Self Care | Payer: BC Managed Care – PPO | Attending: Emergency Medicine | Admitting: Emergency Medicine

## 2022-01-22 DIAGNOSIS — L02415 Cutaneous abscess of right lower limb: Secondary | ICD-10-CM

## 2022-01-22 MED ORDER — DOXYCYCLINE HYCLATE 100 MG PO TABS
100.0000 mg | ORAL_TABLET | Freq: Once | ORAL | Status: AC
  Administered 2022-01-22: 100 mg via ORAL
  Filled 2022-01-22: qty 1

## 2022-01-22 MED ORDER — DOXYCYCLINE HYCLATE 100 MG PO CAPS: 100.0000 mg | ORAL_CAPSULE | Freq: Two times a day (BID) | ORAL | 0 refills | Status: DC

## 2022-01-22 NOTE — ED Provider Notes (Signed)
[facility] EMERGENCY DEPT Provider Note   CSN: [id number] Arrival date & time: 01/21/22  2052     History  Chief Complaint  Patient presents with   Abscess    Jennifer [name] is a 31 y.o. female.  Patient is a 31 year old female presenting with complaints of a painful lump to the inside of her right thigh, perineal region.  She was diagnosed with a cyst 1 month ago and was treated with incision and drainage and antibiotics.  It seems to have recurred.  She denies any fevers or chills.  She denies any aggravating or alleviating factors.  The history is provided by the patient.       Home Medications Prior to Admission medications   Medication Sig Start Date End Date Taking? Authorizing Provider  albuterol (VENTOLIN HFA) 108 (90 Base) MCG/ACT inhaler Inhale 1-2 puffs into the lungs every 6 (six) hours as needed for wheezing or shortness of breath. 12/31/18   [provider]  aspirin EC 81 MG EC tablet Take 1 tablet (81 mg total) by mouth daily. Swallow whole. Patient taking differently: Take 81 mg by mouth every evening. Swallow whole. 12/07/20   Ezekiel Slocumb, DO  dicyclomine (BENTYL) 10 MG capsule Take 10 mg by mouth daily. Patient not taking: Reported on 12/15/2021    [provider]  DULoxetine (CYMBALTA) 30 MG capsule Take 30 mg by mouth at bedtime. 01/28/19 12/16/21  [provider]  erythromycin ophthalmic ointment     [provider]  estradiol (ESTRACE) 0.5 MG tablet Take 0.5 mg by mouth daily. Patient not taking: Reported on 12/30/2021 11/17/21   [provider]  Evolocumab (REPATHA) 140 MG/ML SOSY Inject 140 mg into the skin See admin instructions. Inject 140 mg subcutaneously every other Wednesday evening    [provider]  fenofibrate 160 MG tablet Take 1 tablet (160 mg total) by mouth daily. 12/07/20   Ezekiel Slocumb, DO  [name] 1/20 1-20 MG-MCG tablet Take 1 tablet by mouth daily. Patient  not taking: Reported on 12/15/2021 11/01/21   [provider]  hydrALAZINE (APRESOLINE) 25 MG tablet Take 1 tablet (25 mg total) by mouth 3 (three) times daily. 12/15/21   [name], Shaune Pascal, MD  insulin regular human CONCENTRATED (HUMULIN R) 500 UNIT/ML injection Inject 0.1-0.2 mLs (50-100 Units total) into the skin See admin instructions. 100 units with breakfast and lunch  50units at bedtime 12/12/21   Domenic Polite, MD  ivabradine (CORLANOR) 7.5 MG TABS tablet Take 1 tablet (7.5 mg total) by mouth 2 (two) times daily with a meal. 08/02/21   [name], Shaune Pascal, MD  levothyroxine (SYNTHROID) 125 MCG tablet Take 1 tablet (125 mcg total) by mouth daily at 6 (six) AM. 12/07/20   Ezekiel Slocumb, DO  loratadine (CLARITIN) 10 MG tablet Take 10 mg by mouth daily.    [provider]  medroxyPROGESTERone (PROVERA) 10 MG tablet Take 10 mg by mouth daily. Patient not taking: Reported on 01/06/2022 11/17/21   [provider]  ofloxacin (OCUFLOX) 0.3 % ophthalmic solution Place 1 drop into both eyes daily as needed (only take after eye injections per patient). 09/02/20   [provider]  ondansetron (ZOFRAN) 4 MG tablet Take 4 mg by mouth every 8 (eight) hours as needed for nausea or vomiting. 12/19/18   [provider]  pantoprazole (PROTONIX) 40 MG tablet Take 40 mg by mouth daily. 12/24/19   [provider]  Potassium Chloride ER 20 MEQ TBCR  Take 40 mEq by mouth once a week. Patient not taking: Reported on 12/15/2021 10/24/21   [provider]  pregabalin (LYRICA) 150 MG capsule Take 1 capsule (150 mg total) by mouth at bedtime. Patient taking differently: Take 300 mg by mouth at bedtime. 12/12/21   Domenic Polite, MD  prochlorperazine (COMPAZINE) 5 MG tablet Take 5 mg by mouth every 6 (six) hours as needed for nausea or vomiting.    [provider]  rosuvastatin (CRESTOR) 40 MG tablet Take 40 mg by mouth daily. 08/09/21   [provider]  sildenafil (REVATIO) 20 MG tablet Take 1 tablet (20 mg total) by mouth 3 (three) times daily. 12/15/21   [name], Shaune Pascal, MD  torsemide (DEMADEX) 20 MG tablet Take 4 tablets (80 mg total) by mouth daily. 12/21/21   Clegg, [name] D, NP  tretinoin (RETIN-A) 0.05 % cream Apply 1 application topically at bedtime. 01/17/21   [provider]      Allergies    Ketamine, Erythromycin, Fentanyl, Maitake mushroom [maitake], Penicillin v, Shellfish allergy, Morphine, Penicillins, and Prednisone    Review of Systems   Review of Systems  All other systems reviewed and are negative.   Physical Exam Updated Vital Signs BP (!) 175/93 (BP Location: Right Arm)   Pulse (!) 111   Temp 98.2 F (36.8 C)   Resp 18   SpO2 96%  Physical Exam Vitals and nursing note reviewed.  Constitutional:      General: [name] not in acute distress.    Appearance: Normal appearance. [name] not ill-appearing.  HENT:     Head: Normocephalic and atraumatic.  Pulmonary:     Effort: Pulmonary effort is normal.  Skin:    General: Skin is warm and dry.     Comments: To the medial aspect of the right upper inner thigh, there is a 3 cm, round, fluctuant area noted.  Neurological:     Mental Status: [name] alert and oriented to person, place, and time.     ED Results / Procedures / Treatments   Labs (all labs ordered are listed, but only abnormal results are displayed) Labs Reviewed - No data to display  EKG None  Radiology No results found.  Procedures Procedures    Medications Ordered in ED Medications  doxycycline (VIBRA-TABS) tablet 100 mg (has no administration in time range)    ED Course/ Medical Decision Making/ A&P  Patient presenting with an abscess/cyst to the right upper thigh.  This was incised and drained as below.  Patient to be discharged with warm compresses, sitz bath, and doxycycline.  Return as needed.  INCISION AND DRAINAGE Performed by: Veryl Speak Consent: Verbal  consent obtained. Risks and benefits: risks, benefits and alternatives were discussed Type: abscess  Body area: Right thigh  Anesthesia: local infiltration  Incision was made with a scalpel.  Local anesthetic: lidocaine 1% without epinephrine  Anesthetic total: 3 ml  Complexity: complex Blunt dissection to break up loculations  Drainage: purulent  Drainage amount: Moderate  Packing material: No packing placed  Patient tolerance: Patient tolerated the procedure well with no immediate complications.    Final Clinical Impression(s) / ED Diagnoses Final diagnoses:  None    Rx / DC Orders ED Discharge Orders     None         Veryl Speak, MD 01/22/22 [phone]

## 2022-01-22 NOTE — Discharge Instructions (Addendum)
Apply warm compresses as frequently as possible for the next several days.  Begin taking doxycycline as prescribed.  Return to the ER if symptoms significantly worsen or change.

## 2022-01-26 ENCOUNTER — Other Ambulatory Visit (HOSPITAL_COMMUNITY): Payer: Self-pay

## 2022-01-26 ENCOUNTER — Encounter (HOSPITAL_COMMUNITY): Payer: Self-pay | Admitting: Internal Medicine

## 2022-01-26 ENCOUNTER — Ambulatory Visit (HOSPITAL_COMMUNITY)
Admission: RE | Admit: 2022-01-26 | Discharge: 2022-01-26 | Disposition: A | Discharge status: Home / Self Care | Payer: BC Managed Care – PPO | Source: Ambulatory Visit | Attending: Internal Medicine | Admitting: Internal Medicine

## 2022-01-26 VITALS — BP 142/76 | HR 114 | Wt 148.8 lb

## 2022-01-26 DIAGNOSIS — I272 Pulmonary hypertension, unspecified: Secondary | ICD-10-CM | POA: Insufficient documentation

## 2022-01-26 DIAGNOSIS — I130 Hypertensive heart and chronic kidney disease with heart failure and stage 1 through stage 4 chronic kidney disease, or unspecified chronic kidney disease: Secondary | ICD-10-CM | POA: Insufficient documentation

## 2022-01-26 DIAGNOSIS — N184 Chronic kidney disease, stage 4 (severe): Secondary | ICD-10-CM | POA: Diagnosis not present

## 2022-01-26 DIAGNOSIS — K861 Other chronic pancreatitis: Secondary | ICD-10-CM | POA: Diagnosis not present

## 2022-01-26 DIAGNOSIS — K760 Fatty (change of) liver, not elsewhere classified: Secondary | ICD-10-CM | POA: Diagnosis not present

## 2022-01-26 DIAGNOSIS — Z79899 Other long term (current) drug therapy: Secondary | ICD-10-CM | POA: Diagnosis not present

## 2022-01-26 DIAGNOSIS — E781 Pure hyperglyceridemia: Secondary | ICD-10-CM | POA: Insufficient documentation

## 2022-01-26 DIAGNOSIS — E1122 Type 2 diabetes mellitus with diabetic chronic kidney disease: Secondary | ICD-10-CM | POA: Insufficient documentation

## 2022-01-26 DIAGNOSIS — E1165 Type 2 diabetes mellitus with hyperglycemia: Secondary | ICD-10-CM | POA: Insufficient documentation

## 2022-01-26 DIAGNOSIS — I5032 Chronic diastolic (congestive) heart failure: Secondary | ICD-10-CM | POA: Diagnosis not present

## 2022-01-26 DIAGNOSIS — I071 Rheumatic tricuspid insufficiency: Secondary | ICD-10-CM

## 2022-01-26 DIAGNOSIS — I5023 Acute on chronic systolic (congestive) heart failure: Secondary | ICD-10-CM | POA: Insufficient documentation

## 2022-01-26 DIAGNOSIS — I5022 Chronic systolic (congestive) heart failure: Secondary | ICD-10-CM | POA: Diagnosis not present

## 2022-01-26 DIAGNOSIS — I2510 Atherosclerotic heart disease of native coronary artery without angina pectoris: Secondary | ICD-10-CM | POA: Insufficient documentation

## 2022-01-26 DIAGNOSIS — R140 Abdominal distension (gaseous): Secondary | ICD-10-CM

## 2022-01-26 DIAGNOSIS — E875 Hyperkalemia: Secondary | ICD-10-CM | POA: Insufficient documentation

## 2022-01-26 DIAGNOSIS — R1900 Intra-abdominal and pelvic swelling, mass and lump, unspecified site: Secondary | ICD-10-CM | POA: Insufficient documentation

## 2022-01-26 LAB — BASIC METABOLIC PANEL
Anion gap: 17 — ABNORMAL HIGH (ref 5–15)
BUN: 64 mg/dL — ABNORMAL HIGH (ref 6–20)
CO2: 23 mmol/L (ref 22–32)
Calcium: 10.2 mg/dL (ref 8.9–10.3)
Chloride: 89 mmol/L — ABNORMAL LOW (ref 98–111)
Creatinine, Ser: 2.1 mg/dL — ABNORMAL HIGH (ref 0.44–1.00)
GFR, Estimated: 32 mL/min — ABNORMAL LOW (ref >60)
Glucose, Bld: 580 mg/dL (ref 70–99)
Potassium: 3.9 mmol/L (ref 3.5–5.1)
Sodium: 129 mmol/L — ABNORMAL LOW (ref 135–145)

## 2022-01-26 LAB — BRAIN NATRIURETIC PEPTIDE: B Natriuretic Peptide: 92.7 pg/mL (ref 0.0–100.0)

## 2022-01-26 NOTE — Progress Notes (Signed)
Came out for med check of her pill box to confirm her meds are placed correctly and they are.   Will f/u next week.   [name], [place] 01/26/2022

## 2022-01-26 NOTE — Progress Notes (Signed)
Advanced Heart Failure Clinic Note   PCP: Sue Lush, PA-C HF Cardiologist: Dr. 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   Has been followed by Dr. Einar Gip for her HF.    Admitted 11/22/20 with ab pain and diarrhea. Ab CT concerning for vasculitis (no pancreatitis). Her TG were found to be 4030. She was started on an insulin/dextrose infusion. Cardiology and Nephrology were consulted after her creatinine and LFTs continued to rise and she began developing hypotension. Echo 11/28/20 EF was found to be 20-25% with moderate RV dysfunction and mild septal flattening. Started on DBA 2.5. AHF consulted for further management. PICC placed to follow CVP and Co-Ox. BP were high and milrinone added to augment diuresis. cMRI c/w prior myocarditis vs sarcoid (see below). Mod pericardial effusion also noted. LVEF 29%. RV normal. She underwent R/LHC showing single vessel RCA occlusion, RA 1, LVEDP 17, Fick CO 5.6/CI 3.88. She was able to be weaned off inotropes and GDMT titrated.     Post hospital follow up, she was drinking >2L/day fluid and eating more salty foods, volume was up, ReDs 43%, instructed to increase lasix to 20 mg bid x 3 days. She followed up with [name] with [facility] 12/17/20 for a 2nd opinion. He placed LifeVest and switched beta blocker to Toprol XL.   Called after hours service with swelling, instructed to continue lasix 20 mg bid.   Seen in ED 12/25/20 for fall. She was standing at work and became dizzy. She fell backwards and hit her head. CT neg, labs showed K 5.7. She was given 1 dose of Lokelma. She denied palpitations.   Volume overloaded at follow up 01/06/21, lasix switched to torsemide. She had a LifeVest per Cardiology at [facility].   Echo (10/22): EF  up to 45-50%, grade II DD, RV ok. LifeVest off.   Treated in ED 10/22 for pancreatitis, then admitted 11/22 for hyperglycemia, did not meet criteria for DKA. Hospitalization c/b hyponatremia, sodium 118. Concern for volume overload. Lasix restarted but SCr increased. Nephrology consulted and restarted gentle IVF and GDMT held for a couple of days. Recommendation for Tolvaptan, however patient declined and requested discharge with outpatient follow-up her MD at [facility]. Jardiance stopped at discharge, weight 129 lbs.    Seen back in ED a few days later with increased weight and abd distention. Given IV lasix in ED and torsemide increased to 40 bid.   Follow up 11/22, her breathing had improved but c/o abdominal swelling/bloating, not felt to be related to volume overload. Concern for possible gastroparesis. She was referred to GI. Abd CT was unremarkable. Ceruloplasmin level was also low, raising concern for possible Wilson's Disease. However had further w/u w/ [facility] and reports Wilson's disease was ruled out. Additional GI testing unremarkable.    Echo 12/22 EF 45%, RV normal.    Follow up 08/02/21, weight up 16 lbs, ReDs 27%. Arranged for RHC to better assess hemodynamics and see if abdominal symptoms related to right-sided HF due to high-output state or non-cardiac (see results below).    [place] 5/23 with elevated filling pressures and normal cardiac ouput. Mild to moderate mixed pulmonary hypertension with prominent v-waves in RA, suggestive of significant TR. Given IV lasix and metolazone 2.5 added weekly to diuretic regimen.    RA = 12  prominent V waves RV = 47/14 PA = 48/25 (37) PCW = 25 Fick cardiac output/index = 4.4/2.8 PVR =3.7 WU Ao sat = 99% PA sat = 62%, 62%   Post cath follow up, volume up, REDs 43%. Torsemide increased and weekly metolazone added.   Admitted 5/23 with AKI and hyperkalemia. Given IVF, insulin, calcium gluconate and Lokelma. Echo showed EF 45-50%, RV normal, mild-mod  MR, RVSP 30. Only mild TR. Restarted on torsemide 40 bid (lower dose) and Entresto 49/51 bid. Arranged paramedicine, discharged home 143 lbs.   Follow up 6/23, several med discrepancies. Labs showed serum glucose 531, AKI and hyperkalemia and advised eval in ED. Admitted, GDMT held and hydrated with IVF. Discharged home on previous regimen on GDMT, weight 148 lbs.  Follow up 09/24/21, felt poorly and fluid overloaded. Treated w/ Furoscix 80 mg daily x 4 days, w/ instruction to switch back to torsemide 40 mg bid thereafter and to return in 2 wks for f/u.   Seen 09/27/21 at [facility] for 2nd opinion, felt to be congested and recommended increasing torsemide to 60 mg bid and instructed f/u in 3 months.   Follow up 7/23, continued with bloating. Med discrepancies with her torsemide. Labs at visit c/w dehydration. Scr elevted 3.61 (up from 2.24), BUN 79. BNP 7.9. Entresto held until repeat labs.  Received Furoscix 8/4, 8/5, and 10/31/21.  Admitted 9/23 for volume overload in setting of RV failure and AKI. Required milrinone. Underwent Cardiomems implant  RHC 9/23  RA 15 PA 37/23 (28) PCWP 16 Fick CO/CI 4.2/2.6 Thermo CO/CI 3.7/2.3 PVR 4.3 PA sat 51%, 54% PAPi 0.93  Today she returns for HF follow up with her husband. [name] from [employer] here as well. Blood sugars very high 300-600 range. Trying to watch diet. Cardiomems has been ok until today typically 15-19 (goal 17). Today 26. Says she is drinking a lot of fluids. Taking torsemide 80 mg daily. + ab bloating. + DOE. Had CP yesterday. Typically gets a few times per week. Lasts about 15 mins. Unrelated to exertion. Follows with Dr. Hulen Skains at [facility]. Had renal biopsy recently (12/31/21) and showed DM and HTN nephropathy. They discussed starting HD soon.    ROS: All systems reviewed and negative except as per HPI.   Past Medical History:  Diagnosis Date   Brain tumor ([place]) 03/29/1995   astrocytoma   CHF (congestive heart failure) (HCC)     Cholesterosis    DM (diabetes mellitus) ([place]) 10/10/2018   Fatty liver    HTN (hypertension) 10/10/2018   Hypothyroidism 10/10/2018   Lipoprotein deficiency    Lung disease    longevity long term   Pancreatitis    Polycystic ovary syndrome    Current Outpatient Medications  Medication Sig Dispense Refill   albuterol (VENTOLIN HFA) 108 (90 Base) MCG/ACT inhaler Inhale 1-2 puffs into the lungs every 6 (six) hours as needed for wheezing or shortness of breath.     doxycycline (VIBRAMYCIN) 100 MG capsule Take 1 capsule (100 mg total) by mouth 2 (two) times daily. One po bid x 7 days 14 capsule 0   DULoxetine (CYMBALTA) 30 MG capsule Take 30 mg by mouth at bedtime.     Evolocumab (REPATHA) 140 MG/ML SOSY Inject 140 mg into the skin See admin instructions. Inject 140 mg subcutaneously every other Sunday evening     fenofibrate 160 MG tablet Take 1 tablet (160 mg total) by mouth daily. 30 tablet 1   hydrALAZINE (APRESOLINE) 25 MG tablet Take 1  tablet (25 mg total) by mouth 3 (three) times daily. 90 tablet 3   insulin regular human CONCENTRATED (HUMULIN R) 500 UNIT/ML injection Inject 0.1-0.2 mLs (50-100 Units total) into the skin See admin instructions. 100 units with breakfast and lunch  50units at bedtime (Patient taking differently: Inject 50-100 Units into the skin See admin instructions. 290 units 3 times a day with breakfast lunch and dinner) 20 mL 0   ivabradine (CORLANOR) 7.5 MG TABS tablet Take 1 tablet (7.5 mg total) by mouth 2 (two) times daily with a meal. 60 tablet 6   levothyroxine (SYNTHROID) 125 MCG tablet Take 1 tablet (125 mcg total) by mouth daily at 6 (six) AM. 30 tablet 1   loratadine (CLARITIN) 10 MG tablet Take 10 mg by mouth daily.     ondansetron (ZOFRAN) 4 MG tablet Take 4 mg by mouth every 8 (eight) hours as needed for nausea or vomiting.     pantoprazole (PROTONIX) 40 MG tablet Take 40 mg by mouth daily.     pregabalin (LYRICA) 150 MG capsule Take 1 capsule (150 mg  total) by mouth at bedtime. (Patient taking differently: Take 300 mg by mouth at bedtime.)     prochlorperazine (COMPAZINE) 5 MG tablet Take 5 mg by mouth every 6 (six) hours as needed for nausea or vomiting.     rosuvastatin (CRESTOR) 40 MG tablet Take 40 mg by mouth daily.     sildenafil (REVATIO) 20 MG tablet Take 1 tablet (20 mg total) by mouth 3 (three) times daily. 90 tablet 3   torsemide (DEMADEX) 20 MG tablet Take 4 tablets (80 mg total) by mouth daily. 120 tablet 6   tretinoin (RETIN-A) 0.05 % cream Apply 1 application topically at bedtime.     aspirin EC 81 MG EC tablet Take 1 tablet (81 mg total) by mouth daily. Swallow whole. (Patient not taking: Reported on 01/26/2022) 30 tablet 1   [name] 1/20 1-20 MG-MCG tablet Take 1 tablet by mouth daily. (Patient not taking: Reported on 12/15/2021)     No current facility-administered medications for this encounter.   Allergies  Allergen Reactions   Ketamine Other (See Comments)    In vegetative state for 15 minutes per pt   Erythromycin    Fentanyl Nausea And Vomiting   Maitake Mushroom [[place]] Itching    Itchy throat   Penicillin V Itching    Gi upset   Shellfish Allergy Other (See Comments)    Pt has never had shellfish but tested positive on allergy test. Pt states contrast in CT is okay   Morphine Itching and Rash   Penicillins Rash    Has patient had a PCN reaction causing immediate rash, facial/tongue/throat swelling, SOB or lightheadedness with hypotension: Y Has patient had a PCN reaction causing severe rash involving mucus membranes or skin necrosis: Y Has patient had a PCN reaction that required hospitalization: N Has patient had a PCN reaction occurring within the last 10 years: Y If all of the above answers are "NO", then may proceed with Cephalosporin use.    Prednisone Rash   Social History   Socioeconomic History   Marital status: Married    Spouse name: Not on file   Number of children: 0   Years of  education: Not on file   Highest education level: Not on file  Occupational History   Occupation: [employer]  Tobacco Use   Smoking status: Never   Smokeless tobacco: Never  Vaping Use   Vaping  Use: Never used  Substance and Sexual Activity   Alcohol use: Never   Drug use: Never   Sexual activity: Yes  Other Topics Concern   Not on file  Social History Narrative   Not on file   Social Determinants of Health   Financial Resource Strain: Low Risk  (12/10/2021)   Overall Financial Resource Strain (CARDIA)    Difficulty of Paying Living Expenses: Not hard at all  Food Insecurity: No Food Insecurity (12/03/2021)   Hunger Vital Sign    Worried About Running Out of Food in the Last Year: Never true    Ran Out of Food in the Last Year: Never true  Transportation Needs: No Transportation Needs (12/03/2021)   PRAPARE - Hydrologist (Medical): No    Lack of Transportation (Non-Medical): No  Physical Activity: Sufficiently Active (12/10/2021)   Exercise Vital Sign    Days of Exercise per Week: 3 days    Minutes of Exercise per Session: 60 min  Stress: No Stress Concern Present (12/10/2021)   [place]    Feeling of Stress : Not at all  Social Connections: [place] (12/10/2021)   Social Connection and Isolation Panel [NHANES]    Frequency of Communication with Friends and Family: More than three times a week    Frequency of Social Gatherings with Friends and Family: More than three times a week    Attends Religious Services: More than 4 times per year    Active Member of [employer] or Organizations: No    Attends Music therapist: More than 4 times per year    Marital Status: Married  Human resources officer Violence: Not At Risk (12/03/2021)   Humiliation, Afraid, Rape, and Kick questionnaire    Fear of Current or Ex-Partner: No    Emotionally Abused: No    Physically Abused: No     Sexually Abused: No   Family History  Problem Relation Age of Onset   Diabetes Mother    Hypertension Mother    Hyperlipidemia Mother    Thyroid disease Mother    Hypertension Father    Diabetes Father    Pancreatic cancer Paternal Aunt    Pancreatic cancer Paternal Uncle    Colon cancer Neg Hx    Esophageal cancer Neg Hx    Stomach cancer Neg Hx    BP (!) 142/76   Pulse (!) 114   Wt 67.5 kg (148 lb 12.8 oz)   SpO2 97%   BMI 32.20 kg/m   Wt Readings from Last 3 Encounters:  01/26/22 67.5 kg (148 lb 12.8 oz)  01/19/22 66.7 kg (147 lb)  01/12/22 66.7 kg (147 lb)   PHYSICAL EXAM: General:  Walked into clinic No resp difficulty HEENT: normal + R eye ptosis  + hirsutism Neck: supple. no JVD. Carotids 2+ bilat; no bruits. No lymphadenopathy or thryomegaly appreciated. Cor: PMI nondisplaced. Regular rate & rhythm. No rubs, gallops or murmurs. Lungs: clear Abdomen: obese soft, nontender, + distended. No hepatosplenomegaly. No bruits or masses. Good bowel sounds. Extremities: no cyanosis, clubbing, rash, edema Neuro: alert & orientedx3, cranial nerves grossly intact. moves all 4 extremities w/o difficulty. Affect pleasant  ReDs: 45%-->42% today  ASSESSMENT & PLAN: Acute on Chronic Systolic Heart Failure - likely NICM. - Echo (1/22): EF 35-40% Mod MR - Echo (11/28/20): EF 20-25% with moderate RV dysfunction and mild septal flattening. Mod-severe TR - Rheum serologies negative, but Rheumatoid factor mildly  elevated at 15.5 - cMRI (9/22): EF 29%c/w prior myocarditis vs sarcoid. May have delayed chemo cardiotoxcity.  - R/LHC with single vessel RCA occlusion, RA 1, LVEDP 17, Fick CO 5.6/CI 3.88. - Echo (10/22): EF 45-50%, grade II DD, RV ok. - RHC (5/23) with elevated filling pressures and normal CO, RA 12 (with prominent v-waves), RV 47/14, PA 48/25 (37), PCW 25 - Echo (5/23): EF 45-50%, RV normal  - Echo (9/23): EF 50-55%, RV okay, mild MR, mild to moderate TR - S/p cardiomems  placement 12/06/21 - RHC 12/06/21: RA 15, PA 37/23 (28), PCWP 16, Fick CO/CI 4.2/2.6, Thermo CO/CI 3.7/2.3, PVR 4.3, PA sat 51%, 54%, PAPi 0.93 - VQ negative - Chronically NYHA Class II-III  - Volume up today. On torsemide 80 daily. Cardiomems 15-19 typically. Today 26 in setting of high fluid intake with poorly controlled DM2 - Continue Entresto 24-26 mg bid.  - Continue Coreg 3.125 mg bid. - Continue Corlanor 7.5 mg bid.  - Off Jardiance due to frequent GU infections.  - Off spiro w/ hyperkalemia  - Continue Paramedince.  - Will have her take extra torsemide 80 at night on days when volume up.  - Continue Cardiomems monitoring - Volume management complicated by progressive renal dysfunction. Working with Dr. Hulen Skains (Nephrology at [facility]). Discussing timing of HD start.   2. CKD IV - Baseline Scr ~2.5 - Working with Dr. Hulen Skains (Nephrology at [facility]). Had renal biopsy recently (12/31/21) and showed DM and HTN nephropathy. Discussing timing of HD start.   3. CAD - Single vessel RCA occlusion (small co-dominant vessel) on cardiac cath 09/08. - Medical management.  - Having occasional CP that does not sound anginal  - Continue aspirin + statin.   4. Pulmonary hypertension with cor pulmonale - RHC 12/06/21: RA 15, PA 37/23 (28), PCWP 16, Fick CO/CI 4.2/2.6, Thermo CO/CI 3.7/2.3, PVR 4.3, PA sat 51%, 54%, PAPi 0.93 - VQ negative   5. Severe hyperTG - Due to LPL deficiency w/ recent pancreatitis. - Continue fenofibrate + niacin  - Previously offered referral to [facility] for further management. She prefers to continue with her endocrinologist at [facility] who currently manages/follows.   6. Type 3c DM - Follows w/ endocrinology at [facility]. CBGs currently out of control. Stressed need to f/u with Endo team  - 2/2 chronic pancreatitis   7. Abdominal swelling/bloating - Seen at [facility] (11/20) for IBS, per chart review. - Had penumatosis intestinalis. She was started on pancreatic enzyme  replacement. - EGD (10/21): normal, bx taken to assess for celiac. - CT abdomen (2/22) and RUQ US showed no obvious gallbladder stones. - HIDA (4/22): ended early due to tachypnea, but cystic duct and gallbladder appeared WNL. - CT abdomen 12/22 unremarkable  - suspect primarily fatty liver - Abdominal u/s as above.  8. Poor Med Compliance - Continue paramedicine, appreciate their assistance  Glori Bickers, MD 01/26/22

## 2022-01-26 NOTE — Patient Instructions (Signed)
Medication Changes:  Take an extra 80 mg of Torsemide tonight  Lab Work:  Labs done today, your results will be available in [place], we will contact you for abnormal readings.  Testing/Procedures:  none  Referrals:  none  Special Instructions // Education:  Do the following things EVERYDAY: Weigh yourself in the morning before breakfast. Write it down and keep it in a log. Take your medicines as prescribed Eat low salt foods--Limit salt (sodium) to 2000 mg per day.  Stay as active as you can everyday Limit all fluids for the day to less than 2 liters   Follow-Up in: 3 months  At the [facility], you and your health needs are our priority. We have a designated team specialized in the treatment of Heart Failure. This Care Team includes your primary Heart Failure Specialized Cardiologist (physician), Advanced Practice Providers (APPs- Physician Assistants and Nurse Practitioners), and Pharmacist who all work together to provide you with the care you need, when you need it.   You may see any of the following providers on your designated Care Team at your next follow up:  Dr. Glori Bickers Dr. Loralie Champagne Dr. Roxana Hires, NP Lyda Jester, [place] [facility], [place] Forestine Na, NP Audry Riles, PharmD   Please be sure to bring in all your medications bottles to every appointment.   Need to Contact us:  If you have any questions or concerns before your next appointment please send [place] a message through [place] or call our office at [phone].    TO LEAVE A MESSAGE FOR THE NURSE SELECT OPTION 2, PLEASE LEAVE A MESSAGE INCLUDING: YOUR NAME DATE OF BIRTH CALL BACK NUMBER REASON FOR CALL**this is important as we prioritize the call backs  YOU WILL RECEIVE A CALL BACK THE SAME DAY AS LONG AS YOU CALL BEFORE 4:00 PM

## 2022-01-26 NOTE — Progress Notes (Addendum)
Paramedicine Encounter   Patient ID: Jennifer [name] , female,   DOB: [birth year],31 y.o.,  MRN: [id number]  Pt reports she felt nauseated all day yesterday.  She reports she vomited yesterday. She has c/o nausea a lot the past few wks.  She thinks it is due to one of her meds.  She reports c/o c/p yesterday.  He reports that she has not been good with her cardiomems the past few days.  She has taken her meds this morning, but she reports her meds yesterday did not stat down.  She reports not taking asa since last Thursday-she thinks it is getting her tired.  She has trial and error with which meds made her feel drowsy and she figured out it was the asa so she wants to quit taking it.  She reports even when in the hosp it made her feel this way too, she has not reported to me that it was this bad until today.   Met patient in clinic today with provider.  She went back to doc and have another cyst-they drained it in the ER and feels much better today.   She got started back on another antibiotic.  She reports her CBG's are reading HI on her machine.  Advised her with CBG's that high she needs to be seen in ER.  She only took her insulin once yesterday due to her being n/v.  She reports they have tried the oral meds but she cannot tolerate the side effects.   If her fluid gets up like this again its ok for her to take extra 22m for future.  Will come out this afternoon to check pill box.   Labs today.   Weight @ clinic-148 B/P-142/86 P-114 [id number]  Med changes-take extra dose of torsemide this afternoon   KMarylouise Stacks EGila Crossing11/03/2021

## 2022-01-29 ENCOUNTER — Emergency Department (HOSPITAL_BASED_OUTPATIENT_CLINIC_OR_DEPARTMENT_OTHER)
Admission: EM | Admit: 2022-01-29 | Discharge: 2022-01-30 | Disposition: A | Discharge status: Home / Self Care | Payer: BC Managed Care – PPO | Attending: Emergency Medicine | Admitting: Emergency Medicine

## 2022-01-29 ENCOUNTER — Other Ambulatory Visit: Payer: Self-pay

## 2022-01-29 ENCOUNTER — Emergency Department (HOSPITAL_BASED_OUTPATIENT_CLINIC_OR_DEPARTMENT_OTHER): Payer: BC Managed Care – PPO

## 2022-01-29 ENCOUNTER — Encounter (HOSPITAL_BASED_OUTPATIENT_CLINIC_OR_DEPARTMENT_OTHER): Payer: Self-pay

## 2022-01-29 DIAGNOSIS — I509 Heart failure, unspecified: Secondary | ICD-10-CM | POA: Insufficient documentation

## 2022-01-29 DIAGNOSIS — R739 Hyperglycemia, unspecified: Secondary | ICD-10-CM

## 2022-01-29 DIAGNOSIS — R079 Chest pain, unspecified: Secondary | ICD-10-CM

## 2022-01-29 DIAGNOSIS — E039 Hypothyroidism, unspecified: Secondary | ICD-10-CM | POA: Insufficient documentation

## 2022-01-29 DIAGNOSIS — I110 Hypertensive heart disease with heart failure: Secondary | ICD-10-CM | POA: Insufficient documentation

## 2022-01-29 DIAGNOSIS — R7989 Other specified abnormal findings of blood chemistry: Secondary | ICD-10-CM | POA: Diagnosis not present

## 2022-01-29 DIAGNOSIS — E1165 Type 2 diabetes mellitus with hyperglycemia: Secondary | ICD-10-CM | POA: Insufficient documentation

## 2022-01-29 LAB — CBC
HCT: 33.1 % — ABNORMAL LOW (ref 36.0–46.0)
Hemoglobin: 11 g/dL — ABNORMAL LOW (ref 12.0–15.0)
MCH: 29.2 pg (ref 26.0–34.0)
MCHC: 33.2 g/dL (ref 30.0–36.0)
MCV: 87.8 fL (ref 80.0–100.0)
Platelets: 147 10*3/uL — ABNORMAL LOW (ref 150–400)
RBC: 3.77 MIL/uL — ABNORMAL LOW (ref 3.87–5.11)
RDW: 15.2 % (ref 11.5–15.5)
WBC: 5 10*3/uL (ref 4.0–10.5)
nRBC: 0 % (ref 0.0–0.2)

## 2022-01-29 NOTE — ED Triage Notes (Signed)
Complaining of chest pain that started tonight, is in the center of chest that goes to the left side of chest.

## 2022-01-30 LAB — BASIC METABOLIC PANEL
Anion gap: 15 (ref 5–15)
BUN: 70 mg/dL — ABNORMAL HIGH (ref 6–20)
CO2: 22 mmol/L (ref 22–32)
Calcium: 9.3 mg/dL (ref 8.9–10.3)
Chloride: 86 mmol/L — ABNORMAL LOW (ref 98–111)
Creatinine, Ser: 2.33 mg/dL — ABNORMAL HIGH (ref 0.44–1.00)
GFR, Estimated: 28 mL/min — ABNORMAL LOW (ref >60)
Glucose, Bld: 678 mg/dL (ref 70–99)
Potassium: 5 mmol/L (ref 3.5–5.1)
Sodium: 123 mmol/L — ABNORMAL LOW (ref 135–145)

## 2022-01-30 LAB — PREGNANCY, URINE: Preg Test, Ur: NEGATIVE

## 2022-01-30 LAB — BRAIN NATRIURETIC PEPTIDE: B Natriuretic Peptide: 11.9 pg/mL (ref 0.0–100.0)

## 2022-01-30 LAB — D-DIMER, QUANTITATIVE: D-Dimer, Quant: 0.48 ug/mL-FEU (ref 0.00–0.50)

## 2022-01-30 LAB — CBG MONITORING, ED
Glucose-Capillary: 441 mg/dL — ABNORMAL HIGH (ref 70–99)
Glucose-Capillary: 533 mg/dL (ref 70–99)
Glucose-Capillary: >600 mg/dL (ref 70–99)

## 2022-01-30 LAB — TROPONIN I (HIGH SENSITIVITY)
Troponin I (High Sensitivity): 18 ng/L — ABNORMAL HIGH (ref <18)
Troponin I (High Sensitivity): 22 ng/L — ABNORMAL HIGH (ref <18)

## 2022-01-30 MED ORDER — MORPHINE SULFATE (PF) 4 MG/ML IV SOLN
4.0000 mg | Freq: Once | INTRAVENOUS | Status: AC
  Administered 2022-01-30: 4 mg via INTRAVENOUS
  Filled 2022-01-30: qty 1

## 2022-01-30 MED ORDER — LABETALOL HCL 5 MG/ML IV SOLN
10.0000 mg | Freq: Once | INTRAVENOUS | Status: AC
  Administered 2022-01-30: 10 mg via INTRAVENOUS
  Filled 2022-01-30: qty 4

## 2022-01-30 MED ORDER — INSULIN REGULAR HUMAN (CONC) 500 UNIT/ML ~~LOC~~ SOLN: 50.0000 [IU] | SUBCUTANEOUS | 0 refills | Status: DC

## 2022-01-30 MED ORDER — SODIUM CHLORIDE 0.9 % IV BOLUS
1000.0000 mL | Freq: Once | INTRAVENOUS | Status: AC
  Administered 2022-01-30: 1000 mL via INTRAVENOUS

## 2022-01-30 MED ORDER — INSULIN ASPART 100 UNIT/ML IV SOLN
10.0000 [IU] | Freq: Once | INTRAVENOUS | Status: AC
  Administered 2022-01-30: 10 [IU] via INTRAVENOUS

## 2022-01-30 NOTE — Discharge Instructions (Signed)
You were evaluated in the Emergency Department and after careful evaluation, we did not find any emergent condition requiring admission or further testing in the hospital.  Your exam/testing today was overall reassuring.  Follow-up with your regular doctors to discuss your symptoms.  Please return to the Emergency Department if you experience any worsening of your condition.  Thank you for allowing [place] to be a part of your care.

## 2022-01-30 NOTE — ED Provider Notes (Signed)
[facility] Emergency Department Provider Note MRN:  [id number]  Arrival date & time: 01/30/22     Chief Complaint   Chest Pain   History of Present Illness   Jennifer [name] is a 31 y.o. year-old female with a history of brain tumor, CHF, diabetes presenting to the ED with chief complaint of chest pain.  Left-sided and central chest pain described as sharp, started this evening associated with shortness of breath.  Denies dizziness or sweatiness, no nausea vomiting.  Has chest pain on occasion that she attributes to her CHF, but this was worse than normal.  No leg pain or swelling.  Review of Systems  A thorough review of systems was obtained and all systems are negative except as noted in the HPI and PMH.   Patient's Health History    Past Medical History:  Diagnosis Date   Brain tumor ([place]) 03/29/1995   astrocytoma   CHF (congestive heart failure) (HCC)    Cholesterosis    DM (diabetes mellitus) ([place]) 10/10/2018   Fatty liver    HTN (hypertension) 10/10/2018   Hypothyroidism 10/10/2018   Lipoprotein deficiency    Lung disease    longevity long term   Pancreatitis    Polycystic ovary syndrome     Past Surgical History:  Procedure Laterality Date   ABDOMINAL SURGERY     pt states during miscarriage got her intestine   BRAIN SURGERY     EYE MUSCLE SURGERY Right 03/28/2014   PRESSURE SENSOR/CARDIOMEMS N/A 12/06/2021   Procedure: PRESSURE SENSOR/CARDIOMEMS;  Surgeon: [name], MD;  Location: [facility] [facility];  Service: Cardiovascular;  Laterality: N/A;   RIGHT HEART CATH N/A 08/05/2021   Procedure: RIGHT HEART CATH;  Surgeon: [name], MD;  Location: [place] [facility];  Service: Cardiovascular;  Laterality: N/A;   RIGHT HEART CATH N/A 12/06/2021   Procedure: RIGHT HEART CATH;  Surgeon: [name], MD;  Location: [place] [facility];  Service: Cardiovascular;  Laterality: N/A;   RIGHT/LEFT HEART CATH AND CORONARY  ANGIOGRAPHY N/A 12/03/2020   Procedure: RIGHT/LEFT HEART CATH AND CORONARY ANGIOGRAPHY;  Surgeon: [name], MD;  Location: [facility];  Service: Cardiovascular;  Laterality: N/A;   VENTRICULOSTOMY  03/28/1997    Family History  Problem Relation Age of Onset   Diabetes Mother    Hypertension Mother    Hyperlipidemia Mother    Thyroid disease Mother    Hypertension Father    Diabetes Father    Pancreatic cancer Paternal Aunt    Pancreatic cancer Paternal Uncle    Colon cancer Neg Hx    Esophageal cancer Neg Hx    Stomach cancer Neg Hx     Social History   Socioeconomic History   Marital status: Married    Spouse name: Not on file   Number of children: 0   Years of education: Not on file   Highest education level: Not on file  Occupational History   Occupation: [employer]  Tobacco Use   Smoking status: Never   Smokeless tobacco: Never  Vaping Use   Vaping Use: Never used  Substance and Sexual Activity   Alcohol use: Never   Drug use: Never   Sexual activity: Yes  Other Topics Concern   Not on file  Social History Narrative   Not on file   Social Determinants of Health   Financial Resource Strain: Low Risk  (12/10/2021)   Overall Financial Resource Strain (CARDIA)  Difficulty of Paying Living Expenses: Not hard at all  Food Insecurity: No Food Insecurity (12/03/2021)   Hunger Vital Sign    Worried About Running Out of Food in the Last Year: Never true    Ran Out of Food in the Last Year: Never true  Transportation Needs: No Transportation Needs (12/03/2021)   PRAPARE - Hydrologist (Medical): No    Lack of Transportation (Non-Medical): No  Physical Activity: Sufficiently Active (12/10/2021)   Exercise Vital Sign    Days of Exercise per Week: 3 days    Minutes of Exercise per Session: 60 min  Stress: No Stress Concern Present (12/10/2021)   [place]    Feeling  of Stress : Not at all  Social Connections: Socially Integrated (12/10/2021)   Social Connection and Isolation Panel [NHANES]    Frequency of Communication with Friends and Family: More than three times a week    Frequency of Social Gatherings with Friends and Family: More than three times a week    Attends Religious Services: More than 4 times per year    Active Member of [employer] or Organizations: No    Attends Music therapist: More than 4 times per year    Marital Status: Married  Human resources officer Violence: Not At Risk (12/03/2021)   Humiliation, Afraid, Rape, and Kick questionnaire    Fear of Current or Ex-Partner: No    Emotionally Abused: No    Physically Abused: No    Sexually Abused: No     Physical Exam   Vitals:   01/30/22 0330 01/30/22 0400  BP: (!) 150/88 (!) 130/90  Pulse: (!) 107 (!) 115  Resp: 19 (!) 23  Temp:    SpO2: 97% 95%    CONSTITUTIONAL: Chronically ill-appearing, NAD NEURO/PSYCH:  Alert and oriented x 3, no focal deficits EYES:  eyes equal and reactive ENT/NECK:  no LAD, no JVD CARDIO: Tachycardic rate, well-perfused, normal S1 and S2 PULM:  CTAB no wheezing or rhonchi GI/GU:  non-distended, non-tender MSK/SPINE:  No gross deformities, no edema SKIN:  no rash, atraumatic   *Additional and/or pertinent findings included in MDM below  Diagnostic and Interventional Summary    EKG Interpretation  Date/Time:  Saturday January 29 2022 23:12:47 EDT Ventricular Rate:  122 PR Interval:  124 QRS Duration: 88 QT Interval:  346 QTC Calculation: 493 R Axis:   1 Text Interpretation: Sinus tachycardia Left ventricular hypertrophy with repolarization abnormality ( Cornell product ) Abnormal ECG When compared with ECG of 26-Jan-2022 10:13, No significant change was found Confirmed by Gerlene Fee [phone]) on 01/29/2022 11:53:28 PM       Labs Reviewed  BASIC METABOLIC PANEL - Abnormal; Notable for the following components:      Result Value    Sodium 123 (*)    Chloride 86 (*)    Glucose, Bld 678 (*)    BUN 70 (*)    Creatinine, Ser 2.33 (*)    GFR, Estimated 28 (*)    All other components within normal limits  CBC - Abnormal; Notable for the following components:   RBC 3.77 (*)    Hemoglobin 11.0 (*)    HCT 33.1 (*)    Platelets 147 (*)    All other components within normal limits  CBG MONITORING, ED - Abnormal; Notable for the following components:   Glucose-Capillary >600 (*)    All other components within normal limits  CBG  MONITORING, ED - Abnormal; Notable for the following components:   Glucose-Capillary 533 (*)    All other components within normal limits  CBG MONITORING, ED - Abnormal; Notable for the following components:   Glucose-Capillary 441 (*)    All other components within normal limits  TROPONIN I (HIGH SENSITIVITY) - Abnormal; Notable for the following components:   Troponin I (High Sensitivity) 18 (*)    All other components within normal limits  TROPONIN I (HIGH SENSITIVITY) - Abnormal; Notable for the following components:   Troponin I (High Sensitivity) 22 (*)    All other components within normal limits  PREGNANCY, URINE  BRAIN NATRIURETIC PEPTIDE  D-DIMER, QUANTITATIVE (NOT AT [facility])    DG Chest Port 1 View  Final Result      Medications  labetalol (NORMODYNE) injection 10 mg (10 mg Intravenous Given 01/30/22 0010)  morphine (PF) 4 MG/ML injection 4 mg (4 mg Intravenous Given 01/30/22 0010)  sodium chloride 0.9 % bolus 1,000 mL (0 mLs Intravenous Stopped 01/30/22 0321)  insulin aspart (novoLOG) injection 10 Units (10 Units Intravenous Given 01/30/22 0118)  insulin aspart (novoLOG) injection 10 Units (10 Units Intravenous Given 01/30/22 0321)  sodium chloride 0.9 % bolus 1,000 mL (1,000 mLs Intravenous New Bag/Given 01/30/22 0321)     Procedures  /  Critical Care Procedures  ED Course and Medical Decision Making  Initial Impression and Ddx Differential diagnosis includes hypertensive  urgency or emergency, PE, MSK, pericarditis, pneumothorax.  Past medical/surgical history that increases complexity of ED encounter: Brain cancer, heart failure  Interpretation of Diagnostics I personally reviewed the EKG and my interpretation is as follows: Sinus tachycardia, LVH  Labs reveal hyperglycemia with blood sugar greater than 600, pseudohyponatremia, creatinine is at or near baseline, troponin minimally elevated and flat upon repeat.  Patient Reassessment and Ultimate Disposition/Management     On reassessment patient is feeling better and wants to go home.  We discussed the lab tests and her symptoms and the option of admission.  Patient prefers close outpatient follow-up.  Hyperglycemia but no DKA, blood sugar downtrending with insulin, strict return precautions.  Patient management required discussion with the following services or consulting groups:  None  Complexity of Problems Addressed Acute illness or injury that poses threat of life of bodily function  Additional Data Reviewed and Analyzed Further history obtained from: Further history from spouse/family member  Additional Factors Impacting ED Encounter Risk Consideration of hospitalization  Barth Kirks. [place]onia Small, [place] [email or web address]  Final Clinical Impressions(s) / ED Diagnoses     ICD-10-CM   1. Hyperglycemia  R73.9     2. Chest pain, unspecified type  R07.9       ED Discharge Orders          Ordered    insulin regular human CONCENTRATED (HUMULIN R) 500 UNIT/ML injection  See admin instructions        01/30/22 0411             Discharge Instructions Discussed with and Provided to Patient:     Discharge Instructions      You were evaluated in the Emergency Department and after careful evaluation, we did not find any emergent condition requiring admission or further testing in the hospital.  Your exam/testing today was overall  reassuring.  Follow-up with your regular doctors to discuss your symptoms.  Please return to the Emergency Department if you experience any worsening of your condition.  Thank you for allowing  [place] to be a part of your care.        Maudie Flakes, MD 01/30/22 [phone]

## 2022-02-01 ENCOUNTER — Telehealth (HOSPITAL_COMMUNITY): Payer: Self-pay | Admitting: Surgery

## 2022-02-01 NOTE — Telephone Encounter (Signed)
I called patient to set up nurse appt to give her the sleep device and instructions to perform home sleep study. I left a message for a return call.

## 2022-02-02 ENCOUNTER — Ambulatory Visit (HOSPITAL_COMMUNITY)
Admission: RE | Admit: 2022-02-02 | Discharge: 2022-02-02 | Disposition: A | Discharge status: Home / Self Care | Payer: BC Managed Care – PPO | Source: Ambulatory Visit | Attending: Cardiology | Admitting: Cardiology

## 2022-02-02 ENCOUNTER — Other Ambulatory Visit (HOSPITAL_COMMUNITY): Payer: Self-pay

## 2022-02-02 ENCOUNTER — Telehealth (HOSPITAL_COMMUNITY): Payer: Self-pay | Admitting: Surgery

## 2022-02-02 ENCOUNTER — Other Ambulatory Visit (HOSPITAL_COMMUNITY): Payer: Self-pay | Admitting: Surgery

## 2022-02-02 DIAGNOSIS — I5022 Chronic systolic (congestive) heart failure: Secondary | ICD-10-CM | POA: Insufficient documentation

## 2022-02-02 DIAGNOSIS — G4734 Idiopathic sleep related nonobstructive alveolar hypoventilation: Secondary | ICD-10-CM

## 2022-02-02 NOTE — Progress Notes (Signed)
Paramedicine Encounter    Patient ID: Jennifer [name], female    DOB: [birth year], 31 y.o.   MRN: [id number]  Pt reports she is doing ok. She had to go to ER on 11/4 for c/p and hyperglycemia.  They got her CBG down to 441 and she wanted to f/u with endo doc outpt. She has a very hard time reaching that doc and they dont respond to messages.  She advised that the other day her CBG's was in the 60s and she had hard time getting it back up.  She denies any c/p since then.  Reached out to triage nurse about the sleep study test-totally forgot to mention that while in clinic last week-she will be contacted from HF clniic to sch a nurse visit to come in and get sleep study and to review the instructions.  She denies increased sob at this time.  Her cardiomems is elevated at 20 today.  Her nausea is better--it seems like that was related to high CBG.  She has completed the course of antibiotics.  I advised her to monitor her breathing and weight and bloating to see if she needs the extra torsemide.  I will check her cardiomems tomor.   Pill box checked. She had one extra hydralazine in one of the doses so that was removed.  She has not taken meds yet this morning.    CBG's--200-300s BP (!) 140/88   Pulse 94   Resp 16   Wt 146 lb (66.2 kg)   SpO2 98%   BMI 31.59 kg/m  Weight yesterday-147  Last visit weight-148 @ clinic   Patient Care Team: Elinor Parkinson as PCP - General (Physician Assistant) Jacelyn Pi, MD as PCP - Endocrinology (Endocrinology)  Patient Active Problem List   Diagnosis Date Noted   Heart failure ([place]) 12/03/2021   Acute on chronic systolic CHF (congestive heart failure) ([place]) 12/02/2021   Elevated troponin 12/02/2021   Pancytopenia ([place]) 12/02/2021   Amenorrhea 12/02/2021   Hyperosmolar hyperglycemic state (HHS) ([place]) 08/26/2021   Small intestinal bacterial overgrowth (SIBO) 08/26/2021   Lower extremity edema 08/26/2021   Hyperkalemia 08/14/2021    Hx of insulin dependent diabetes mellitus 08/14/2021   Acute kidney injury superimposed on chronic kidney disease ([place]) 05/13/2021   Chest pain 03/10/2021   Acute on chronic combined systolic and diastolic CHF (congestive heart failure) ([place]) 02/09/2021   History of astrocytoma of brain 02/09/2021   Severe hyperglycemia due to diabetes mellitus ([place]) 01/25/2021   Diarrhea    Elevated transaminase level    Acute combined systolic and diastolic heart failure (HCC)    Nonischemic cardiomyopathy (HCC)    Acute decompensated heart failure (HCC)    Elevated liver enzymes    Acute renal failure superimposed on stage 3a chronic kidney disease ([place]) 11/26/2020   Abdominal pain 62/83/1517   Chronic systolic CHF (congestive heart failure) ([place]) 11/23/2020   Essential hypertension 11/23/2020   Hypertriglyceridemia 11/23/2020   Type 2 diabetes mellitus with hyperlipidemia ([place]) 10/10/2018   Hypothyroidism 10/10/2018    Current Outpatient Medications:    albuterol (VENTOLIN HFA) 108 (90 Base) MCG/ACT inhaler, Inhale 1-2 puffs into the lungs every 6 (six) hours as needed for wheezing or shortness of breath., Disp: , Rfl:    aspirin EC 81 MG EC tablet, Take 1 tablet (81 mg total) by mouth daily. Swallow whole., Disp: 30 tablet, Rfl: 1   DULoxetine (CYMBALTA) 30 MG capsule, Take 30 mg by mouth at bedtime., Disp: , Rfl:  Evolocumab (REPATHA) 140 MG/ML SOSY, Inject 140 mg into the skin See admin instructions. Inject 140 mg subcutaneously every other Sunday evening, Disp: , Rfl:    fenofibrate 160 MG tablet, Take 1 tablet (160 mg total) by mouth daily., Disp: 30 tablet, Rfl: 1   hydrALAZINE (APRESOLINE) 25 MG tablet, Take 1 tablet (25 mg total) by mouth 3 (three) times daily., Disp: 90 tablet, Rfl: 3   ivabradine (CORLANOR) 7.5 MG TABS tablet, Take 1 tablet (7.5 mg total) by mouth 2 (two) times daily with a meal., Disp: 60 tablet, Rfl: 6   levothyroxine (SYNTHROID) 125 MCG tablet, Take 1 tablet (125 mcg  total) by mouth daily at 6 (six) AM., Disp: 30 tablet, Rfl: 1   loratadine (CLARITIN) 10 MG tablet, Take 10 mg by mouth daily., Disp: , Rfl:    ondansetron (ZOFRAN) 4 MG tablet, Take 4 mg by mouth every 8 (eight) hours as needed for nausea or vomiting., Disp: , Rfl:    pantoprazole (PROTONIX) 40 MG tablet, Take 40 mg by mouth daily., Disp: , Rfl:    pregabalin (LYRICA) 150 MG capsule, Take 1 capsule (150 mg total) by mouth at bedtime. (Patient taking differently: Take 300 mg by mouth at bedtime.), Disp: , Rfl:    prochlorperazine (COMPAZINE) 5 MG tablet, Take 5 mg by mouth every 6 (six) hours as needed for nausea or vomiting., Disp: , Rfl:    rosuvastatin (CRESTOR) 40 MG tablet, Take 40 mg by mouth daily., Disp: , Rfl:    sildenafil (REVATIO) 20 MG tablet, Take 1 tablet (20 mg total) by mouth 3 (three) times daily., Disp: 90 tablet, Rfl: 3   torsemide (DEMADEX) 20 MG tablet, Take 4 tablets (80 mg total) by mouth daily., Disp: 120 tablet, Rfl: 6   tretinoin (RETIN-A) 0.05 % cream, Apply 1 application topically at bedtime., Disp: , Rfl:    doxycycline (VIBRAMYCIN) 100 MG capsule, Take 1 capsule (100 mg total) by mouth 2 (two) times daily. One po bid x 7 days (Patient not taking: Reported on 02/02/2022), Disp: 14 capsule, Rfl: 0   [name] 1/20 1-20 MG-MCG tablet, Take 1 tablet by mouth daily. (Patient not taking: Reported on 12/15/2021), Disp: , Rfl:    insulin regular human CONCENTRATED (HUMULIN R) 500 UNIT/ML injection, Inject 0.1-0.2 mLs (50-100 Units total) into the skin See admin instructions. 100 units with breakfast and lunch  50units at bedtime, Disp: 20 mL, Rfl: 0 Allergies  Allergen Reactions   Ketamine Other (See Comments)    In vegetative state for 15 minutes per pt   Erythromycin    Fentanyl Nausea And Vomiting   Maitake Mushroom [[place]] Itching    Itchy throat   Penicillin V Itching    Gi upset   Shellfish Allergy Other (See Comments)    Pt has never had shellfish but tested  positive on allergy test. Pt states contrast in CT is okay   Morphine Itching and Rash   Penicillins Rash    Has patient had a PCN reaction causing immediate rash, facial/tongue/throat swelling, SOB or lightheadedness with hypotension: Y Has patient had a PCN reaction causing severe rash involving mucus membranes or skin necrosis: Y Has patient had a PCN reaction that required hospitalization: N Has patient had a PCN reaction occurring within the last 10 years: Y If all of the above answers are "NO", then may proceed with Cephalosporin use.    Prednisone Rash      Social History   Socioeconomic History  Marital status: Married    Spouse name: Not on file   Number of children: 0   Years of education: Not on file   Highest education level: Not on file  Occupational History   Occupation: [employer]  Tobacco Use   Smoking status: Never   Smokeless tobacco: Never  Vaping Use   Vaping Use: Never used  Substance and Sexual Activity   Alcohol use: Never   Drug use: Never   Sexual activity: Yes  Other Topics Concern   Not on file  Social History Narrative   Not on file   Social Determinants of Health   Financial Resource Strain: Low Risk  (12/10/2021)   Overall Financial Resource Strain (CARDIA)    Difficulty of Paying Living Expenses: Not hard at all  Food Insecurity: No Food Insecurity (12/03/2021)   Hunger Vital Sign    Worried About Running Out of Food in the Last Year: Never true    [place] in the Last Year: Never true  Transportation Needs: No Transportation Needs (12/03/2021)   PRAPARE - Hydrologist (Medical): No    Lack of Transportation (Non-Medical): No  Physical Activity: Sufficiently Active (12/10/2021)   Exercise Vital Sign    Days of Exercise per Week: 3 days    Minutes of Exercise per Session: 60 min  Stress: No Stress Concern Present (12/10/2021)   [place]    Feeling of Stress : Not at all  Social Connections: [place] (12/10/2021)   Social Connection and Isolation Panel [NHANES]    Frequency of Communication with Friends and Family: More than three times a week    Frequency of Social Gatherings with Friends and Family: More than three times a week    Attends Religious Services: More than 4 times per year    Active Member of Clubs or Organizations: No    Attends Music therapist: More than 4 times per year    Marital Status: Married  Human resources officer Violence: Not At Risk (12/03/2021)   Humiliation, Afraid, Rape, and Kick questionnaire    Fear of Current or Ex-Partner: No    Emotionally Abused: No    Physically Abused: No    Sexually Abused: No    Physical Exam      Future Appointments  Date Time Provider [place]  04/28/2022 10:00 AM [facility] PA/NP [place], [place] Paramedic  02/02/22

## 2022-02-02 NOTE — Telephone Encounter (Signed)
Patient called to set up an appt to pick up ordered home sleep study device and instructions to complete.  She will come in today at 11AM.

## 2022-02-02 NOTE — Progress Notes (Signed)
Patient in clinic to pick up Home sleep device and receive instructions for use.  She and her companion were give instructions and verbalize understanding.  She will await proceeding until we can check for insurance precert.

## 2022-02-02 NOTE — Progress Notes (Signed)
   Date:  02/02/2022 STOP BANG RISK ASSESSMENT S (snore) Have you been told that you snore?     YES   T (tired) Are you often tired, fatigued, or sleepy during the day?   YES  O (obstruction) Do you stop breathing, choke, or gasp during sleep? NO   P (pressure) Do you have or are you being treated for high blood pressure? YES   B (BMI) Is your body index greater than 35 kg/m? YES   A (age) Are you 30 years old or older? NO   N (neck) Do you have a neck circumference greater than 16 inches?   NO    4  TOTAL STOP/BANG "YES" ANSWERS         '

## 2022-02-03 ENCOUNTER — Emergency Department (HOSPITAL_BASED_OUTPATIENT_CLINIC_OR_DEPARTMENT_OTHER)
Admission: EM | Admit: 2022-02-03 | Discharge: 2022-02-03 | Disposition: A | Discharge status: Home / Self Care | Payer: BC Managed Care – PPO | Source: Home / Self Care | Attending: Emergency Medicine | Admitting: Emergency Medicine

## 2022-02-03 ENCOUNTER — Other Ambulatory Visit: Payer: Self-pay

## 2022-02-03 ENCOUNTER — Telehealth (HOSPITAL_COMMUNITY): Payer: Self-pay

## 2022-02-03 ENCOUNTER — Encounter (HOSPITAL_BASED_OUTPATIENT_CLINIC_OR_DEPARTMENT_OTHER): Payer: Self-pay | Admitting: Emergency Medicine

## 2022-02-03 ENCOUNTER — Emergency Department (HOSPITAL_BASED_OUTPATIENT_CLINIC_OR_DEPARTMENT_OTHER): Payer: BC Managed Care – PPO | Admitting: Radiology

## 2022-02-03 DIAGNOSIS — R0602 Shortness of breath: Secondary | ICD-10-CM | POA: Diagnosis not present

## 2022-02-03 DIAGNOSIS — I5023 Acute on chronic systolic (congestive) heart failure: Secondary | ICD-10-CM | POA: Insufficient documentation

## 2022-02-03 DIAGNOSIS — Z7982 Long term (current) use of aspirin: Secondary | ICD-10-CM | POA: Insufficient documentation

## 2022-02-03 DIAGNOSIS — R7401 Elevation of levels of liver transaminase levels: Secondary | ICD-10-CM | POA: Insufficient documentation

## 2022-02-03 DIAGNOSIS — Z794 Long term (current) use of insulin: Secondary | ICD-10-CM | POA: Insufficient documentation

## 2022-02-03 DIAGNOSIS — K859 Acute pancreatitis without necrosis or infection, unspecified: Secondary | ICD-10-CM | POA: Diagnosis not present

## 2022-02-03 LAB — HEPATIC FUNCTION PANEL
ALT: 119 U/L — ABNORMAL HIGH (ref 0–44)
AST: 107 U/L — ABNORMAL HIGH (ref 15–41)
Albumin: 4.2 g/dL (ref 3.5–5.0)
Alkaline Phosphatase: 141 U/L — ABNORMAL HIGH (ref 38–126)
Bilirubin, Direct: 0.2 mg/dL (ref 0.0–0.2)
Indirect Bilirubin: 0.5 mg/dL (ref 0.3–0.9)
Total Bilirubin: 0.7 mg/dL (ref 0.3–1.2)
Total Protein: 7.6 g/dL (ref 6.5–8.1)

## 2022-02-03 LAB — CBC
HCT: 32.3 % — ABNORMAL LOW (ref 36.0–46.0)
Hemoglobin: 10.7 g/dL — ABNORMAL LOW (ref 12.0–15.0)
MCH: 28.8 pg (ref 26.0–34.0)
MCHC: 33.1 g/dL (ref 30.0–36.0)
MCV: 86.8 fL (ref 80.0–100.0)
Platelets: 172 10*3/uL (ref 150–400)
RBC: 3.72 MIL/uL — ABNORMAL LOW (ref 3.87–5.11)
RDW: 15.2 % (ref 11.5–15.5)
WBC: 6.3 10*3/uL (ref 4.0–10.5)
nRBC: 0 % (ref 0.0–0.2)

## 2022-02-03 LAB — BASIC METABOLIC PANEL
Anion gap: 20 — ABNORMAL HIGH (ref 5–15)
BUN: 64 mg/dL — ABNORMAL HIGH (ref 6–20)
CO2: 19 mmol/L — ABNORMAL LOW (ref 22–32)
Calcium: 9.9 mg/dL (ref 8.9–10.3)
Chloride: 106 mmol/L (ref 98–111)
Creatinine, Ser: 1.99 mg/dL — ABNORMAL HIGH (ref 0.44–1.00)
GFR, Estimated: 34 mL/min — ABNORMAL LOW (ref >60)
Glucose, Bld: 152 mg/dL — ABNORMAL HIGH (ref 70–99)
Potassium: 4.7 mmol/L (ref 3.5–5.1)
Sodium: 145 mmol/L (ref 135–145)

## 2022-02-03 LAB — CBG MONITORING, ED
Glucose-Capillary: 62 mg/dL — ABNORMAL LOW (ref 70–99)
Glucose-Capillary: 146 mg/dL — ABNORMAL HIGH (ref 70–99)
Glucose-Capillary: 176 mg/dL — ABNORMAL HIGH (ref 70–99)

## 2022-02-03 LAB — PREGNANCY, URINE: Preg Test, Ur: NEGATIVE

## 2022-02-03 LAB — TROPONIN I (HIGH SENSITIVITY)
Troponin I (High Sensitivity): 30 ng/L — ABNORMAL HIGH (ref <18)
Troponin I (High Sensitivity): 31 ng/L — ABNORMAL HIGH (ref <18)

## 2022-02-03 LAB — BRAIN NATRIURETIC PEPTIDE: B Natriuretic Peptide: 39.4 pg/mL (ref 0.0–100.0)

## 2022-02-03 MED ORDER — OXYCODONE HCL 5 MG PO TABS
5.0000 mg | ORAL_TABLET | Freq: Once | ORAL | Status: AC
  Administered 2022-02-03: 5 mg via ORAL
  Filled 2022-02-03: qty 1

## 2022-02-03 MED ORDER — MORPHINE SULFATE (PF) 4 MG/ML IV SOLN
4.0000 mg | Freq: Once | INTRAVENOUS | Status: AC
  Administered 2022-02-03: 4 mg via INTRAVENOUS
  Filled 2022-02-03: qty 1

## 2022-02-03 MED ORDER — SODIUM CHLORIDE 0.9% FLUSH: 3.0000 mL | Freq: Two times a day (BID) | INTRAVENOUS | Status: DC

## 2022-02-03 MED ORDER — FUROSEMIDE 10 MG/ML IJ SOLN
80.0000 mg | Freq: Once | INTRAMUSCULAR | Status: AC
  Administered 2022-02-03: 80 mg via INTRAVENOUS
  Filled 2022-02-03: qty 8

## 2022-02-03 MED ORDER — SODIUM CHLORIDE 0.9% FLUSH: 3.0000 mL | INTRAVENOUS | Status: DC | PRN

## 2022-02-03 MED ORDER — SODIUM CHLORIDE 0.9 % IV SOLN: 250.0000 mL | INTRAVENOUS | Status: DC | PRN

## 2022-02-03 NOTE — ED Notes (Signed)
[facility]ed [facility] again about BMP, states that approx 20 mins will result

## 2022-02-03 NOTE — Telephone Encounter (Signed)
Pt reached out to me reporting she feels more bloated today. I reminded her that Dr. Haroldine Laws advised she could take 61m extra torsemide in the afternoon should she feel like this.  Plus her cardiomems is on the rise the past few days.  [name] going to take the extra dose of torsemide this afternoon.  KMarylouise Stacks ENarka11/11/2021

## 2022-02-03 NOTE — ED Notes (Signed)
Per [facility] Lab Tech, lab has now received light green blood sample tube for pending BMP. Sample currently running now. Primary RN Hildred Alamin and EDP Regenia Skeeter updated.

## 2022-02-03 NOTE — Discharge Instructions (Signed)
Call your cardiologist tomorrow for further outpatient treatment for your fluid overload.  Return to the ER or call 911 if you develop trouble breathing, chest pain, or any other new/concerning symptoms.

## 2022-02-03 NOTE — ED Provider Notes (Signed)
[facility] EMERGENC[facility] DEPT Provider Note   CSN: [id number] Arrival date & time: 02/03/22  1606     History  Chief Complaint  Patient presents with   Shortness of Breath    Jennifer [name] is a 31 y.o. female.  HPI 31 year old female with a history of systolic heart failure who follows with Dr. Haroldine Laws presents with shortness of breath, chest pressure and feeling of fluid overload.  This is a recurrent issue for her.  For the last 3 days or so she has been feeling abdominal distention, chest pain, shortness of breath.  The chest pain is like a pressure and is worse when she lays flat.  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  [name] trying to drink less water.  However her symptoms have not improved and she was told to come here to potentially get IV Lasix.  Home Medications Prior to Admission medications   Medication Sig Start Date End Date Taking? Authorizing Provider  albuterol (VENTOLIN HFA) 108 (90 Base) MCG/ACT inhaler Inhale 1-2 puffs into the lungs every 6 (six) hours as needed for wheezing or shortness of breath. 12/31/18   [provider]  aspirin EC 81 MG EC tablet Take 1 tablet (81 mg total) by mouth daily. Swallow whole. 12/07/20   Ezekiel Slocumb, DO  doxycycline (VIBRAMYCIN) 100 MG capsule Take 1 capsule (100 mg total) by mouth 2 (two) times daily. One po bid x 7 days Patient not taking: Reported on 02/02/2022 01/22/22   Veryl Speak, MD  DULoxetine (CYMBALTA) 30 MG capsule Take 30 mg by mouth at bedtime. 01/28/19 01/27/23  [provider]  Evolocumab (REPATHA) 140 MG/ML SOSY Inject 140 mg into the skin See admin instructions. Inject 140 mg subcutaneously every other Sunday evening    [provider]  fenofibrate 160 MG tablet Take 1 tablet (160 mg total) by mouth daily. 12/07/20   Ezekiel Slocumb, DO  [name] 1/20 1-20 MG-MCG tablet Take 1 tablet by mouth  daily. Patient not taking: Reported on 12/15/2021 11/01/21   [provider]  hydrALAZINE (APRESOLINE) 25 MG tablet Take 1 tablet (25 mg total) by mouth 3 (three) times daily. 12/15/21   [name], Shaune Pascal, MD  insulin regular human CONCENTRATED (HUMULIN R) 500 UNIT/ML injection Inject 0.1-0.2 mLs (50-100 Units total) into the skin See admin instructions. 100 units with breakfast and lunch  50units at bedtime 01/30/22   Maudie Flakes, MD  ivabradine (CORLANOR) 7.5 MG TABS tablet Take 1 tablet (7.5 mg total) by mouth 2 (two) times daily with a meal. 08/02/21   [name], Shaune Pascal, MD  levothyroxine (SYNTHROID) 125 MCG tablet Take 1 tablet (125 mcg total) by mouth daily at 6 (six) AM. 12/07/20   Ezekiel Slocumb, DO  loratadine (CLARITIN) 10 MG tablet Take 10 mg by mouth daily.    [provider]  ondansetron (ZOFRAN) 4 MG tablet Take 4 mg by mouth every 8 (eight) hours as needed for nausea or vomiting. 12/19/18   [provider]  pantoprazole (PROTONIX) 40 MG tablet Take 40 mg by mouth daily. 12/24/19   [provider]  pregabalin (LYRICA) 150 MG capsule Take 1 capsule (150 mg total) by mouth at bedtime. Patient taking differently: Take 300 mg by mouth at bedtime. 12/12/21   Domenic Polite, MD  prochlorperazine (COMPAZINE) 5 MG tablet Take 5 mg by mouth every 6 (six) hours as needed for  nausea or vomiting.    [provider]  rosuvastatin (CRESTOR) 40 MG tablet Take 40 mg by mouth daily. 08/09/21   [provider]  sildenafil (REVATIO) 20 MG tablet Take 1 tablet (20 mg total) by mouth 3 (three) times daily. 12/15/21   [name], Shaune Pascal, MD  torsemide (DEMADEX) 20 MG tablet Take 4 tablets (80 mg total) by mouth daily. 12/21/21   Clegg, [name] D, NP  tretinoin (RETIN-A) 0.05 % cream Apply 1 application topically at bedtime. 01/17/21   [provider]      Allergies    Ketamine, Erythromycin, Fentanyl, M[place]ake mushroom [maitake], Penicillin v,  Shellfish allergy, Morphine, Penicillins, and Prednisone    Review of Systems   Review of Systems  Constitutional:  Negative for fever.  Respiratory:  Positive for shortness of breath. Negative for cough.   Cardiovascular:  Positive for chest pain. Negative for leg swelling.  Gastrointestinal:  Positive for abdominal distention.    Physical Exam Updated Vital Signs BP (!) 143/82   Pulse (!) 109   Temp 98.1 F (36.7 C) (Oral)   Resp 19   Ht 4' 9"  (1.448 m)   Wt 68.9 kg   SpO2 97%   BMI 32.87 kg/m  Physical Exam Vitals and nursing note reviewed.  Constitutional:      General: [name] not in acute distress.    Appearance: [name] well-developed. [name] not ill-appearing or diaphoretic.  HENT:     Head: Normocephalic and atraumatic.  Cardiovascular:     Rate and Rhythm: Regular rhythm. Tachycardia present.     Heart sounds: Normal heart sounds.  Pulmonary:     Effort: Pulmonary effort is normal.     Breath sounds: Normal breath sounds. No wheezing.  Abdominal:     Palpations: Abdomen is soft.     Tenderness: There is no abdominal tenderness.  Musculoskeletal:     Right lower leg: No edema.     Left lower leg: No edema.  Skin:    General: Skin is warm and dry.  Neurological:     Mental Status: [name] alert.     ED Results / Procedures / Treatments   Labs (all labs ordered are listed, but only abnormal results are displayed) Labs Reviewed  BASIC METABOLIC PANEL - Abnormal; Notable for the following components:      Result Value   CO2 19 (*)    Glucose, Bld 152 (*)    BUN 64 (*)    Creatinine, Ser 1.99 (*)    GFR, Estimated 34 (*)    Anion gap 20 (*)    All other components within normal limits  CBC - Abnormal; Notable for the following components:   RBC 3.72 (*)    Hemoglobin 10.7 (*)    HCT 32.3 (*)    All other components within normal limits  HEPATIC FUNCTION PANEL - Abnormal; Notable for the following components:   AST 107 (*)    ALT 119 (*)     Alkaline Phosphatase 141 (*)    All other components within normal limits  CBG MONITORING, ED - Abnormal; Notable for the following components:   Glucose-Capillary 62 (*)    All other components within normal limits  CBG MONITORING, ED - Abnormal; Notable for the following components:   Glucose-Capillary 146 (*)    All other components within normal limits  CBG MONITORING, ED - Abnormal; Notable for the following components:   Glucose-Capillary 176 (*)    All other  components within normal limits  TROPONIN I (HIGH SENSITIVITY) - Abnormal; Notable for the following components:   Troponin I (High Sensitivity) 30 (*)    All other components within normal limits  TROPONIN I (HIGH SENSITIVITY) - Abnormal; Notable for the following components:   Troponin I (High Sensitivity) 31 (*)    All other components within normal limits  PREGNANCY, URINE  BRAIN NATRIURETIC PEPTIDE  CBG MONITORING, ED    ED ECG REPORT   Date: 02/03/2022  Rate: 108  Rhythm: sinus tachycardia  QRS Axis: normal  Intervals: normal  ST/T Wave abnormalities: nonspecific ST/T changes  Conduction Disutrbances:none  Narrative Interpretation:   Old EKG Reviewed: unchanged  I have personally reviewed the EKG tracing and agree with the computerized printout as noted.   Radiology DG Chest 2 View  Result Date: 02/03/2022 CLINICAL DATA:  Chest pain EXAM: CHEST - 2 VIEW COMPARISON:  Multiple priors including most recent chest radiograph January 29, 2022. FINDINGS: Normal size heart. Mild central vascular prominence. CardioMEMS projects over the left mediastinum. No focal airspace consolidation or overt pulmonary edema. No acute osseous abnormality. IMPRESSION: Mild central vascular prominence without overt pulmonary edema or focal airspace consolidation. Electronically Signed   By: Dahlia Bailiff M.D.   On: 02/03/2022 17:00    Procedures Procedures    Medications Ordered in ED Medications  sodium chloride flush (NS) 0.9  % injection 3 mL (3 mLs Intravenous Not Given 02/03/22 2232)  sodium chloride flush (NS) 0.9 % injection 3 mL (has no administration in time range)  0.9 %  sodium chloride infusion (has no administration in time range)  morphine (PF) 4 MG/ML injection 4 mg (4 mg Intravenous Given 02/03/22 1859)  furosemide (LASIX) injection 80 mg (80 mg Intravenous Given 02/03/22 2202)  oxyCODONE (Oxy IR/ROXICODONE) immediate release tablet 5 mg (5 mg Oral Given 02/03/22 2218)    ED Course/ Medical Decision Making/ A&P                           Medical Decision Making Amount and/or Complexity of Data Reviewed Labs: ordered.    Details: CKD is at baseline.  Anemia at baseline.  Troponins are flat and mildly elevated consistent with her known history of CHF.  Mildly abnormal LFTs also baseline. Radiology: ordered and independent interpretation performed.    Details: No obvious CHF ECG/medicine tests: independent interpretation performed.    Details: Sinus tachycardia.  No acute ischemia.  Risk Prescription drug management.   Patient presents with concern for fluid overload and IV Lasix.  Unfortunately due to her history of high triglycerides her BMP could not be run here and was sent to [facility] and then eventually sent to [facility].  Ultimately had returned at baseline.  However this led to a prolonged stay here.  She was given IV Lasix and feels a lot better and now is going to the bathroom which she was in earlier.  She has some nonspecific chest pain and some nonspecifically elevated troponins but they are flat and most consistent with her chronic heart failure.  She did develop some mild hypoglycemia down to 62 and this came up with food.  Otherwise, discussed options with patient and she is comfortable going home and follow-up with her cardiologist for further outpatient planning and treatment of her heart failure.  Tachycardia appears to be chronic based on chart review.  Highly doubt PE, ACS,  etc.        Final  Clinical Impression(s) / ED Diagnoses Final diagnoses:  Acute on chronic systolic congestive heart failure [facility])    Rx / DC Orders ED Discharge Orders     None         Sherwood Gambler, MD 02/03/22 2328

## 2022-02-03 NOTE — ED Triage Notes (Signed)
Reports sob and increase abdominal girth bloated feeling Hx of CHF Took 28m torsemide x 2 today under direction of provider. Worsening for a few days

## 2022-02-05 ENCOUNTER — Encounter (HOSPITAL_BASED_OUTPATIENT_CLINIC_OR_DEPARTMENT_OTHER): Payer: Self-pay | Admitting: Emergency Medicine

## 2022-02-05 ENCOUNTER — Other Ambulatory Visit: Payer: Self-pay

## 2022-02-05 DIAGNOSIS — L299 Pruritus, unspecified: Secondary | ICD-10-CM | POA: Diagnosis not present

## 2022-02-05 DIAGNOSIS — E039 Hypothyroidism, unspecified: Secondary | ICD-10-CM | POA: Diagnosis present

## 2022-02-05 DIAGNOSIS — E1122 Type 2 diabetes mellitus with diabetic chronic kidney disease: Secondary | ICD-10-CM | POA: Diagnosis present

## 2022-02-05 DIAGNOSIS — R161 Splenomegaly, not elsewhere classified: Secondary | ICD-10-CM | POA: Diagnosis present

## 2022-02-05 DIAGNOSIS — E875 Hyperkalemia: Secondary | ICD-10-CM | POA: Diagnosis not present

## 2022-02-05 DIAGNOSIS — I2729 Other secondary pulmonary hypertension: Secondary | ICD-10-CM | POA: Diagnosis present

## 2022-02-05 DIAGNOSIS — E7800 Pure hypercholesterolemia, unspecified: Secondary | ICD-10-CM | POA: Diagnosis present

## 2022-02-05 DIAGNOSIS — Z880 Allergy status to penicillin: Secondary | ICD-10-CM

## 2022-02-05 DIAGNOSIS — Z8249 Family history of ischemic heart disease and other diseases of the circulatory system: Secondary | ICD-10-CM

## 2022-02-05 DIAGNOSIS — Z800 Family history of malignant neoplasm of digestive organs: Secondary | ICD-10-CM

## 2022-02-05 DIAGNOSIS — I2781 Cor pulmonale (chronic): Secondary | ICD-10-CM | POA: Diagnosis present

## 2022-02-05 DIAGNOSIS — E282 Polycystic ovarian syndrome: Secondary | ICD-10-CM | POA: Diagnosis present

## 2022-02-05 DIAGNOSIS — K859 Acute pancreatitis without necrosis or infection, unspecified: Principal | ICD-10-CM | POA: Diagnosis present

## 2022-02-05 DIAGNOSIS — Z888 Allergy status to other drugs, medicaments and biological substances status: Secondary | ICD-10-CM

## 2022-02-05 DIAGNOSIS — Z884 Allergy status to anesthetic agent status: Secondary | ICD-10-CM

## 2022-02-05 DIAGNOSIS — Z7982 Long term (current) use of aspirin: Secondary | ICD-10-CM

## 2022-02-05 DIAGNOSIS — I428 Other cardiomyopathies: Secondary | ICD-10-CM | POA: Diagnosis present

## 2022-02-05 DIAGNOSIS — I5023 Acute on chronic systolic (congestive) heart failure: Secondary | ICD-10-CM | POA: Diagnosis present

## 2022-02-05 DIAGNOSIS — Z91013 Allergy to seafood: Secondary | ICD-10-CM

## 2022-02-05 DIAGNOSIS — E669 Obesity, unspecified: Secondary | ICD-10-CM | POA: Diagnosis present

## 2022-02-05 DIAGNOSIS — E1165 Type 2 diabetes mellitus with hyperglycemia: Secondary | ICD-10-CM | POA: Diagnosis present

## 2022-02-05 DIAGNOSIS — D696 Thrombocytopenia, unspecified: Secondary | ICD-10-CM | POA: Diagnosis present

## 2022-02-05 DIAGNOSIS — E1169 Type 2 diabetes mellitus with other specified complication: Secondary | ICD-10-CM | POA: Diagnosis present

## 2022-02-05 DIAGNOSIS — Z794 Long term (current) use of insulin: Secondary | ICD-10-CM

## 2022-02-05 DIAGNOSIS — I2510 Atherosclerotic heart disease of native coronary artery without angina pectoris: Secondary | ICD-10-CM | POA: Diagnosis present

## 2022-02-05 DIAGNOSIS — Z6834 Body mass index (BMI) 34.0-34.9, adult: Secondary | ICD-10-CM

## 2022-02-05 DIAGNOSIS — K7581 Nonalcoholic steatohepatitis (NASH): Secondary | ICD-10-CM | POA: Diagnosis present

## 2022-02-05 DIAGNOSIS — K861 Other chronic pancreatitis: Secondary | ICD-10-CM | POA: Diagnosis present

## 2022-02-05 DIAGNOSIS — Z8349 Family history of other endocrine, nutritional and metabolic diseases: Secondary | ICD-10-CM

## 2022-02-05 DIAGNOSIS — I959 Hypotension, unspecified: Secondary | ICD-10-CM | POA: Diagnosis not present

## 2022-02-05 DIAGNOSIS — K219 Gastro-esophageal reflux disease without esophagitis: Secondary | ICD-10-CM | POA: Diagnosis present

## 2022-02-05 DIAGNOSIS — I130 Hypertensive heart and chronic kidney disease with heart failure and stage 1 through stage 4 chronic kidney disease, or unspecified chronic kidney disease: Secondary | ICD-10-CM | POA: Diagnosis present

## 2022-02-05 DIAGNOSIS — D631 Anemia in chronic kidney disease: Secondary | ICD-10-CM | POA: Diagnosis present

## 2022-02-05 DIAGNOSIS — Z85841 Personal history of malignant neoplasm of brain: Secondary | ICD-10-CM

## 2022-02-05 DIAGNOSIS — Z79899 Other long term (current) drug therapy: Secondary | ICD-10-CM

## 2022-02-05 DIAGNOSIS — Z79620 Long term (current) use of immunosuppressive biologic: Secondary | ICD-10-CM

## 2022-02-05 DIAGNOSIS — Z9221 Personal history of antineoplastic chemotherapy: Secondary | ICD-10-CM

## 2022-02-05 DIAGNOSIS — Z833 Family history of diabetes mellitus: Secondary | ICD-10-CM

## 2022-02-05 DIAGNOSIS — N184 Chronic kidney disease, stage 4 (severe): Secondary | ICD-10-CM | POA: Diagnosis present

## 2022-02-05 DIAGNOSIS — Z1152 Encounter for screening for COVID-19: Secondary | ICD-10-CM

## 2022-02-05 DIAGNOSIS — Z885 Allergy status to narcotic agent status: Secondary | ICD-10-CM

## 2022-02-05 LAB — TROPONIN I (HIGH SENSITIVITY): Troponin I (High Sensitivity): 24 ng/L — ABNORMAL HIGH (ref <18)

## 2022-02-05 LAB — URINALYSIS, ROUTINE W REFLEX MICROSCOPIC
Bilirubin Urine: NEGATIVE
Glucose, UA: 100 mg/dL — AB
Hgb urine dipstick: NEGATIVE
Ketones, ur: NEGATIVE mg/dL
Leukocytes,Ua: NEGATIVE
Nitrite: NEGATIVE
Specific Gravity, Urine: 1.013 (ref 1.005–1.030)
pH: 5 (ref 5.0–8.0)

## 2022-02-05 LAB — CBC
HCT: 32.1 % — ABNORMAL LOW (ref 36.0–46.0)
Hemoglobin: 11.4 g/dL — ABNORMAL LOW (ref 12.0–15.0)
MCH: 31 pg (ref 26.0–34.0)
MCHC: 35.5 g/dL (ref 30.0–36.0)
MCV: 87.2 fL (ref 80.0–100.0)
Platelets: 182 10*3/uL (ref 150–400)
RBC: 3.68 MIL/uL — ABNORMAL LOW (ref 3.87–5.11)
RDW: 15.3 % (ref 11.5–15.5)
WBC: 5.1 10*3/uL (ref 4.0–10.5)
nRBC: 0 % (ref 0.0–0.2)

## 2022-02-05 LAB — PREGNANCY, URINE: Preg Test, Ur: NEGATIVE

## 2022-02-05 MED ORDER — ONDANSETRON HCL 4 MG/2ML IJ SOLN
4.0000 mg | Freq: Once | INTRAMUSCULAR | Status: AC | PRN
  Filled 2022-02-05: qty 2

## 2022-02-05 MED ORDER — ONDANSETRON HCL 4 MG/2ML IJ SOLN
INTRAMUSCULAR | Status: AC
  Administered 2022-02-05: 4 mg via INTRAVENOUS
  Filled 2022-02-05: qty 2

## 2022-02-05 NOTE — ED Triage Notes (Signed)
Pt presents for general abd pain (worse in LUQ) that started tonight. Associated with N/V/D, HA, SOB, CP, abd distention Denies fever, chills. H/o CHF, pancreatitis (last episode last year) Has not tried any home or OTC meds d/t emesis.

## 2022-02-06 ENCOUNTER — Encounter (HOSPITAL_COMMUNITY): Payer: Self-pay | Admitting: Internal Medicine

## 2022-02-06 ENCOUNTER — Inpatient Hospital Stay (HOSPITAL_BASED_OUTPATIENT_CLINIC_OR_DEPARTMENT_OTHER)
Admission: EM | Admit: 2022-02-06 | Discharge: 2022-02-13 | DRG: 438 | Disposition: A | Discharge status: Home / Self Care | Payer: BC Managed Care – PPO | Attending: Internal Medicine | Admitting: Internal Medicine

## 2022-02-06 ENCOUNTER — Emergency Department (HOSPITAL_BASED_OUTPATIENT_CLINIC_OR_DEPARTMENT_OTHER): Payer: BC Managed Care – PPO

## 2022-02-06 ENCOUNTER — Encounter (HOSPITAL_COMMUNITY): Payer: Self-pay

## 2022-02-06 DIAGNOSIS — E781 Pure hyperglyceridemia: Secondary | ICD-10-CM | POA: Diagnosis present

## 2022-02-06 DIAGNOSIS — I2781 Cor pulmonale (chronic): Secondary | ICD-10-CM | POA: Diagnosis present

## 2022-02-06 DIAGNOSIS — I5022 Chronic systolic (congestive) heart failure: Secondary | ICD-10-CM | POA: Diagnosis present

## 2022-02-06 DIAGNOSIS — E1169 Type 2 diabetes mellitus with other specified complication: Secondary | ICD-10-CM | POA: Diagnosis present

## 2022-02-06 DIAGNOSIS — I272 Pulmonary hypertension, unspecified: Secondary | ICD-10-CM | POA: Diagnosis not present

## 2022-02-06 DIAGNOSIS — I10 Essential (primary) hypertension: Secondary | ICD-10-CM | POA: Diagnosis present

## 2022-02-06 DIAGNOSIS — R109 Unspecified abdominal pain: Secondary | ICD-10-CM | POA: Diagnosis not present

## 2022-02-06 DIAGNOSIS — E669 Obesity, unspecified: Secondary | ICD-10-CM | POA: Insufficient documentation

## 2022-02-06 DIAGNOSIS — N184 Chronic kidney disease, stage 4 (severe): Secondary | ICD-10-CM | POA: Diagnosis present

## 2022-02-06 DIAGNOSIS — I5043 Acute on chronic combined systolic (congestive) and diastolic (congestive) heart failure: Secondary | ICD-10-CM | POA: Diagnosis not present

## 2022-02-06 DIAGNOSIS — E66811 Obesity, class 1: Secondary | ICD-10-CM | POA: Insufficient documentation

## 2022-02-06 DIAGNOSIS — I428 Other cardiomyopathies: Secondary | ICD-10-CM | POA: Diagnosis present

## 2022-02-06 DIAGNOSIS — Z6834 Body mass index (BMI) 34.0-34.9, adult: Secondary | ICD-10-CM | POA: Diagnosis not present

## 2022-02-06 DIAGNOSIS — I5023 Acute on chronic systolic (congestive) heart failure: Secondary | ICD-10-CM | POA: Diagnosis present

## 2022-02-06 DIAGNOSIS — I5020 Unspecified systolic (congestive) heart failure: Secondary | ICD-10-CM | POA: Diagnosis not present

## 2022-02-06 DIAGNOSIS — N1832 Chronic kidney disease, stage 3b: Secondary | ICD-10-CM | POA: Diagnosis present

## 2022-02-06 DIAGNOSIS — I959 Hypotension, unspecified: Secondary | ICD-10-CM | POA: Diagnosis not present

## 2022-02-06 DIAGNOSIS — R1013 Epigastric pain: Secondary | ICD-10-CM

## 2022-02-06 DIAGNOSIS — D696 Thrombocytopenia, unspecified: Secondary | ICD-10-CM | POA: Diagnosis present

## 2022-02-06 DIAGNOSIS — E039 Hypothyroidism, unspecified: Secondary | ICD-10-CM | POA: Diagnosis present

## 2022-02-06 DIAGNOSIS — Z1152 Encounter for screening for COVID-19: Secondary | ICD-10-CM | POA: Diagnosis not present

## 2022-02-06 DIAGNOSIS — K7581 Nonalcoholic steatohepatitis (NASH): Secondary | ICD-10-CM | POA: Diagnosis present

## 2022-02-06 DIAGNOSIS — E1122 Type 2 diabetes mellitus with diabetic chronic kidney disease: Secondary | ICD-10-CM | POA: Diagnosis present

## 2022-02-06 DIAGNOSIS — K859 Acute pancreatitis without necrosis or infection, unspecified: Secondary | ICD-10-CM | POA: Diagnosis present

## 2022-02-06 DIAGNOSIS — Z794 Long term (current) use of insulin: Secondary | ICD-10-CM | POA: Diagnosis not present

## 2022-02-06 DIAGNOSIS — K861 Other chronic pancreatitis: Secondary | ICD-10-CM | POA: Diagnosis present

## 2022-02-06 DIAGNOSIS — I2510 Atherosclerotic heart disease of native coronary artery without angina pectoris: Secondary | ICD-10-CM | POA: Diagnosis present

## 2022-02-06 DIAGNOSIS — D631 Anemia in chronic kidney disease: Secondary | ICD-10-CM | POA: Diagnosis present

## 2022-02-06 DIAGNOSIS — I130 Hypertensive heart and chronic kidney disease with heart failure and stage 1 through stage 4 chronic kidney disease, or unspecified chronic kidney disease: Secondary | ICD-10-CM | POA: Diagnosis present

## 2022-02-06 DIAGNOSIS — E1165 Type 2 diabetes mellitus with hyperglycemia: Secondary | ICD-10-CM | POA: Diagnosis present

## 2022-02-06 DIAGNOSIS — R0602 Shortness of breath: Secondary | ICD-10-CM | POA: Diagnosis present

## 2022-02-06 DIAGNOSIS — E7800 Pure hypercholesterolemia, unspecified: Secondary | ICD-10-CM | POA: Diagnosis present

## 2022-02-06 DIAGNOSIS — E875 Hyperkalemia: Secondary | ICD-10-CM | POA: Diagnosis not present

## 2022-02-06 DIAGNOSIS — L299 Pruritus, unspecified: Secondary | ICD-10-CM | POA: Diagnosis not present

## 2022-02-06 DIAGNOSIS — I255 Ischemic cardiomyopathy: Secondary | ICD-10-CM | POA: Diagnosis not present

## 2022-02-06 DIAGNOSIS — I2729 Other secondary pulmonary hypertension: Secondary | ICD-10-CM | POA: Diagnosis present

## 2022-02-06 LAB — COMPREHENSIVE METABOLIC PANEL
ALT: 134 U/L — ABNORMAL HIGH (ref 0–44)
AST: 133 U/L — ABNORMAL HIGH (ref 15–41)
Albumin: 4 g/dL (ref 3.5–5.0)
Alkaline Phosphatase: 141 U/L — ABNORMAL HIGH (ref 38–126)
Anion gap: 17 — ABNORMAL HIGH (ref 5–15)
BUN: 51 mg/dL — ABNORMAL HIGH (ref 6–20)
CO2: 24 mmol/L (ref 22–32)
Calcium: 9.3 mg/dL (ref 8.9–10.3)
Chloride: 96 mmol/L — ABNORMAL LOW (ref 98–111)
Creatinine, Ser: 2.32 mg/dL — ABNORMAL HIGH (ref 0.44–1.00)
GFR, Estimated: 28 mL/min — ABNORMAL LOW (ref >60)
Glucose, Bld: 341 mg/dL — ABNORMAL HIGH (ref 70–99)
Potassium: 4.4 mmol/L (ref 3.5–5.1)
Sodium: 137 mmol/L (ref 135–145)
Total Bilirubin: 1 mg/dL (ref 0.3–1.2)
Total Protein: 7 g/dL (ref 6.5–8.1)

## 2022-02-06 LAB — MRSA NEXT GEN BY PCR, NASAL: MRSA by PCR Next Gen: NOT DETECTED

## 2022-02-06 LAB — RESP PANEL BY RT-PCR (FLU A&B, COVID) ARPGX2
Influenza A by PCR: NEGATIVE
Influenza B by PCR: NEGATIVE
SARS Coronavirus 2 by RT PCR: NEGATIVE

## 2022-02-06 LAB — BASIC METABOLIC PANEL
Anion gap: 19 — ABNORMAL HIGH (ref 5–15)
Anion gap: 20 — ABNORMAL HIGH (ref 5–15)
BUN: 49 mg/dL — ABNORMAL HIGH (ref 6–20)
BUN: 47 mg/dL — ABNORMAL HIGH (ref 6–20)
CO2: 21 mmol/L — ABNORMAL LOW (ref 22–32)
CO2: 20 mmol/L — ABNORMAL LOW (ref 22–32)
Calcium: 9.4 mg/dL (ref 8.9–10.3)
Calcium: 9.6 mg/dL (ref 8.9–10.3)
Chloride: 100 mmol/L (ref 98–111)
Chloride: 101 mmol/L (ref 98–111)
Creatinine, Ser: 2.19 mg/dL — ABNORMAL HIGH (ref 0.44–1.00)
Creatinine, Ser: 2.07 mg/dL — ABNORMAL HIGH (ref 0.44–1.00)
GFR, Estimated: 30 mL/min — ABNORMAL LOW (ref >60)
GFR, Estimated: 32 mL/min — ABNORMAL LOW (ref >60)
Glucose, Bld: 81 mg/dL (ref 70–99)
Glucose, Bld: 95 mg/dL (ref 70–99)
Potassium: 3.5 mmol/L (ref 3.5–5.1)
Potassium: 4.4 mmol/L (ref 3.5–5.1)
Sodium: 140 mmol/L (ref 135–145)
Sodium: 141 mmol/L (ref 135–145)

## 2022-02-06 LAB — MAGNESIUM: Magnesium: 2 mg/dL (ref 1.7–2.4)

## 2022-02-06 LAB — CBG MONITORING, ED
Glucose-Capillary: 86 mg/dL (ref 70–99)
Glucose-Capillary: 97 mg/dL (ref 70–99)
Glucose-Capillary: 125 mg/dL — ABNORMAL HIGH (ref 70–99)
Glucose-Capillary: 111 mg/dL — ABNORMAL HIGH (ref 70–99)
Glucose-Capillary: 71 mg/dL (ref 70–99)

## 2022-02-06 LAB — LIPASE, BLOOD: Lipase: 34 U/L (ref 11–51)

## 2022-02-06 LAB — TROPONIN I (HIGH SENSITIVITY): Troponin I (High Sensitivity): 25 ng/L — ABNORMAL HIGH (ref <18)

## 2022-02-06 LAB — LIPID PANEL
Cholesterol: 714 mg/dL — ABNORMAL HIGH (ref 0–200)
LDL Cholesterol: UNDETERMINED mg/dL (ref 0–99)
Triglycerides: >5000 mg/dL — ABNORMAL HIGH (ref <150)
VLDL: UNDETERMINED mg/dL (ref 0–40)

## 2022-02-06 LAB — LDL CHOLESTEROL, DIRECT: Direct LDL: UNDETERMINED mg/dL (ref 0–99)

## 2022-02-06 LAB — GLUCOSE, CAPILLARY: Glucose-Capillary: 95 mg/dL (ref 70–99)

## 2022-02-06 LAB — TRIGLYCERIDES: Triglycerides: >5000 mg/dL — ABNORMAL HIGH (ref <150)

## 2022-02-06 MED ORDER — SILDENAFIL CITRATE 20 MG PO TABS: 20.0000 mg | ORAL_TABLET | Freq: Three times a day (TID) | ORAL | Status: DC | PRN

## 2022-02-06 MED ORDER — DEXTROSE IN LACTATED RINGERS 5 % IV SOLN: INTRAVENOUS | Status: DC

## 2022-02-06 MED ORDER — LACTATED RINGERS IV SOLN: INTRAVENOUS | Status: DC

## 2022-02-06 MED ORDER — SILDENAFIL CITRATE 20 MG PO TABS
20.0000 mg | ORAL_TABLET | Freq: Three times a day (TID) | ORAL | Status: DC
  Administered 2022-02-06 – 2022-02-09 (×7): 20 mg via ORAL
  Filled 2022-02-06 (×9): qty 1

## 2022-02-06 MED ORDER — DEXTROSE 10 % IV SOLN: INTRAVENOUS | Status: DC

## 2022-02-06 MED ORDER — ONDANSETRON HCL 4 MG/2ML IJ SOLN
4.0000 mg | Freq: Four times a day (QID) | INTRAMUSCULAR | Status: DC | PRN
  Administered 2022-02-06: 4 mg via INTRAVENOUS
  Filled 2022-02-06: qty 2

## 2022-02-06 MED ORDER — IVABRADINE HCL 5 MG PO TABS
7.5000 mg | ORAL_TABLET | Freq: Two times a day (BID) | ORAL | Status: DC
  Administered 2022-02-06 – 2022-02-13 (×13): 7.5 mg via ORAL
  Filled 2022-02-06 (×16): qty 2

## 2022-02-06 MED ORDER — ASPIRIN 81 MG PO TBEC
81.0000 mg | DELAYED_RELEASE_TABLET | Freq: Every day | ORAL | Status: DC
  Administered 2022-02-06 – 2022-02-13 (×8): 81 mg via ORAL
  Filled 2022-02-06 (×8): qty 1

## 2022-02-06 MED ORDER — LEVOTHYROXINE SODIUM 25 MCG PO TABS
125.0000 ug | ORAL_TABLET | Freq: Every day | ORAL | Status: DC
  Administered 2022-02-06 – 2022-02-13 (×8): 125 ug via ORAL
  Filled 2022-02-06 (×8): qty 1

## 2022-02-06 MED ORDER — HYDRALAZINE HCL 25 MG PO TABS
25.0000 mg | ORAL_TABLET | Freq: Three times a day (TID) | ORAL | Status: DC
  Administered 2022-02-06 – 2022-02-08 (×7): 25 mg via ORAL
  Filled 2022-02-06 (×8): qty 1

## 2022-02-06 MED ORDER — IVABRADINE HCL 7.5 MG PO TABS: 7.5000 mg | ORAL_TABLET | Freq: Two times a day (BID) | ORAL | Status: DC

## 2022-02-06 MED ORDER — FENOFIBRATE 160 MG PO TABS
160.0000 mg | ORAL_TABLET | Freq: Every day | ORAL | Status: DC
  Administered 2022-02-07 – 2022-02-13 (×7): 160 mg via ORAL
  Filled 2022-02-06 (×7): qty 1

## 2022-02-06 MED ORDER — SODIUM CHLORIDE 0.9 % IV BOLUS
500.0000 mL | Freq: Once | INTRAVENOUS | Status: AC
  Administered 2022-02-06: 500 mL via INTRAVENOUS

## 2022-02-06 MED ORDER — INSULIN REGULAR(HUMAN) IN NACL 100-0.9 UT/100ML-% IV SOLN
INTRAVENOUS | Status: DC
  Administered 2022-02-06: 0.6 [IU]/h via INTRAVENOUS
  Administered 2022-02-06: 1 [IU]/h via INTRAVENOUS
  Filled 2022-02-06: qty 100

## 2022-02-06 MED ORDER — HYDRALAZINE HCL 25 MG PO TABS
25.0000 mg | ORAL_TABLET | Freq: Three times a day (TID) | ORAL | Status: DC
  Administered 2022-02-06: 25 mg via ORAL
  Filled 2022-02-06: qty 1

## 2022-02-06 MED ORDER — OXYCODONE HCL 5 MG PO TABS
5.0000 mg | ORAL_TABLET | ORAL | Status: DC | PRN
  Administered 2022-02-06 – 2022-02-13 (×12): 5 mg via ORAL
  Filled 2022-02-06 (×12): qty 1

## 2022-02-06 MED ORDER — DULOXETINE HCL 30 MG PO CPEP
30.0000 mg | ORAL_CAPSULE | Freq: Every day | ORAL | Status: DC
  Administered 2022-02-06 – 2022-02-12 (×7): 30 mg via ORAL
  Filled 2022-02-06 (×7): qty 1

## 2022-02-06 MED ORDER — PROCHLORPERAZINE EDISYLATE 10 MG/2ML IJ SOLN: 10.0000 mg | Freq: Four times a day (QID) | INTRAMUSCULAR | Status: DC | PRN

## 2022-02-06 MED ORDER — HYDROMORPHONE HCL 1 MG/ML IJ SOLN
1.0000 mg | Freq: Once | INTRAMUSCULAR | Status: AC
  Administered 2022-02-06: 1 mg via INTRAVENOUS
  Filled 2022-02-06: qty 1

## 2022-02-06 MED ORDER — ENOXAPARIN SODIUM 40 MG/0.4ML IJ SOSY
40.0000 mg | PREFILLED_SYRINGE | INTRAMUSCULAR | Status: DC
  Administered 2022-02-06 – 2022-02-08 (×3): 40 mg via SUBCUTANEOUS
  Filled 2022-02-06 (×3): qty 0.4

## 2022-02-06 MED ORDER — TORSEMIDE 20 MG PO TABS
80.0000 mg | ORAL_TABLET | Freq: Every day | ORAL | Status: DC
  Administered 2022-02-07 – 2022-02-09 (×3): 80 mg via ORAL
  Filled 2022-02-06 (×3): qty 4

## 2022-02-06 MED ORDER — HYDROMORPHONE HCL 1 MG/ML IJ SOLN
1.0000 mg | Freq: Four times a day (QID) | INTRAMUSCULAR | Status: DC | PRN
  Administered 2022-02-06: 1 mg via INTRAVENOUS
  Filled 2022-02-06: qty 1

## 2022-02-06 MED ORDER — HYDROMORPHONE HCL 1 MG/ML IJ SOLN
INTRAMUSCULAR | Status: AC
  Filled 2022-02-06: qty 1

## 2022-02-06 MED ORDER — ORAL CARE MOUTH RINSE: 15.0000 mL | OROMUCOSAL | Status: DC | PRN

## 2022-02-06 MED ORDER — CHLORHEXIDINE GLUCONATE CLOTH 2 % EX PADS
6.0000 | MEDICATED_PAD | Freq: Every day | CUTANEOUS | Status: DC
  Administered 2022-02-06 – 2022-02-12 (×9): 6 via TOPICAL

## 2022-02-06 MED ORDER — HYDROMORPHONE HCL 1 MG/ML IJ SOLN
1.0000 mg | INTRAMUSCULAR | Status: DC | PRN
  Administered 2022-02-06 – 2022-02-12 (×23): 1 mg via INTRAVENOUS
  Filled 2022-02-06 (×22): qty 1

## 2022-02-06 MED ORDER — ROSUVASTATIN CALCIUM 20 MG PO TABS
40.0000 mg | ORAL_TABLET | Freq: Every day | ORAL | Status: DC
  Administered 2022-02-07 – 2022-02-13 (×7): 40 mg via ORAL
  Filled 2022-02-06: qty 1
  Filled 2022-02-06 (×7): qty 2

## 2022-02-06 MED ORDER — DEXTROSE 50 % IV SOLN
0.0000 mL | INTRAVENOUS | Status: DC | PRN
  Filled 2022-02-06: qty 50

## 2022-02-06 MED ORDER — PREGABALIN 100 MG PO CAPS
300.0000 mg | ORAL_CAPSULE | Freq: Every day | ORAL | Status: DC
  Administered 2022-02-06 – 2022-02-12 (×7): 300 mg via ORAL
  Filled 2022-02-06 (×7): qty 3

## 2022-02-06 NOTE — Assessment & Plan Note (Signed)
BMI 34

## 2022-02-06 NOTE — Assessment & Plan Note (Signed)
BP elevated - Continue hydralazine, torsemide

## 2022-02-06 NOTE — Assessment & Plan Note (Signed)
Hyperglycemic on admission, resolved with insulin drip. - See above note re: endocrinology recommendations for U100 vs U500 - Continue insulin drip for now - Transition to U100 when trigs <1000

## 2022-02-06 NOTE — ED Provider Notes (Signed)
[facility] EMERGENCY DEPT Provider Note   CSN: [id number] Arrival date & time: 02/05/22  2218     History  Chief Complaint  Patient presents with   Abdominal Pain    Jennifer [name] is a 31 y.o. female with history of chronic idiopathic pancreatitis, congestive heart failure and fluid restricted diet, diabetes on insulin, presented to the ED with nausea, abdominal pain.  Patient reports abdominal pain began after dinner yesterday evening.  [name] a feels like her pancreatitis flareups.  She says she often gets pancreatitis due to "high triglyceride levels".  She has not been able to tolerate p.o.  [name] happily reports she was seen in the ED for chest pain and shortness of breath, 2 days ago, and the symptoms are persistent but less significant.  Her cardiologist is at [facility] for heart failure,Dr Bensimohn. Her GI doctor is at [facility]  Normally takes insulin RU 500 290 units TID and 10 units QHS  HPI     Home Medications Prior to Admission medications   Medication Sig Start Date End Date Taking? Authorizing Provider  albuterol (VENTOLIN HFA) 108 (90 Base) MCG/ACT inhaler Inhale 1-2 puffs into the lungs every 6 (six) hours as needed for wheezing or shortness of breath. 12/31/18   [provider]  aspirin EC 81 MG EC tablet Take 1 tablet (81 mg total) by mouth daily. Swallow whole. 12/07/20   Ezekiel Slocumb, DO  doxycycline (VIBRAMYCIN) 100 MG capsule Take 1 capsule (100 mg total) by mouth 2 (two) times daily. One po bid x 7 days Patient not taking: Reported on 02/02/2022 01/22/22   Veryl Speak, MD  DULoxetine (CYMBALTA) 30 MG capsule Take 30 mg by mouth at bedtime. 01/28/19 01/27/23  [provider]  Evolocumab (REPATHA) 140 MG/ML SOSY Inject 140 mg into the skin See admin instructions. Inject 140 mg subcutaneously every other Sunday evening    [provider]  fenofibrate 160 MG tablet Take 1 tablet (160 mg total) by mouth daily. 12/07/20    Ezekiel Slocumb, DO  [name] 1/20 1-20 MG-MCG tablet Take 1 tablet by mouth daily. Patient not taking: Reported on 12/15/2021 11/01/21   [provider]  hydrALAZINE (APRESOLINE) 25 MG tablet Take 1 tablet (25 mg total) by mouth 3 (three) times daily. 12/15/21   [name], Shaune Pascal, MD  insulin regular human CONCENTRATED (HUMULIN R) 500 UNIT/ML injection Inject 0.1-0.2 mLs (50-100 Units total) into the skin See admin instructions. 100 units with breakfast and lunch  50units at bedtime 01/30/22   Maudie Flakes, MD  ivabradine (CORLANOR) 7.5 MG TABS tablet Take 1 tablet (7.5 mg total) by mouth 2 (two) times daily with a meal. 08/02/21   [name], Shaune Pascal, MD  levothyroxine (SYNTHROID) 125 MCG tablet Take 1 tablet (125 mcg total) by mouth daily at 6 (six) AM. 12/07/20   Ezekiel Slocumb, DO  loratadine (CLARITIN) 10 MG tablet Take 10 mg by mouth daily.    [provider]  ondansetron (ZOFRAN) 4 MG tablet Take 4 mg by mouth every 8 (eight) hours as needed for nausea or vomiting. 12/19/18   [provider]  pantoprazole (PROTONIX) 40 MG tablet Take 40 mg by mouth daily. 12/24/19   [provider]  pregabalin (LYRICA) 150 MG capsule Take 1 capsule (150 mg total) by mouth at bedtime. Patient taking differently: Take 300 mg by mouth at bedtime. 12/12/21   Domenic Polite, MD  prochlorperazine (COMPAZINE) 5 MG tablet Take 5 mg  by mouth every 6 (six) hours as needed for nausea or vomiting.    [provider]  rosuvastatin (CRESTOR) 40 MG tablet Take 40 mg by mouth daily. 08/09/21   [provider]  sildenafil (REVATIO) 20 MG tablet Take 1 tablet (20 mg total) by mouth 3 (three) times daily. 12/15/21   [name], Shaune Pascal, MD  torsemide (DEMADEX) 20 MG tablet Take 4 tablets (80 mg total) by mouth daily. 12/21/21   Clegg, [name] D, NP  tretinoin (RETIN-A) 0.05 % cream Apply 1 application topically at bedtime. 01/17/21   [provider]      Allergies     Ketamine, Erythromycin, Fentanyl, Maitake mushroom [maitake], Penicillin v, Shellfish allergy, Morphine, Penicillins, and Prednisone    Review of Systems   Review of Systems  Physical Exam Updated Vital Signs BP 139/77   Pulse 99   Temp 98.7 F (37.1 C) (Oral)   Resp (!) 23   Wt 67.6 kg   LMP  (LMP Unknown) Comment: neg preg test  SpO2 98%   BMI 32.24 kg/m  Physical Exam Constitutional:      General: [name] not in acute distress. HENT:     Head: Normocephalic and atraumatic.  Eyes:     Conjunctiva/sclera: Conjunctivae normal.     Pupils: Pupils are equal, round, and reactive to light.  Cardiovascular:     Rate and Rhythm: Normal rate and regular rhythm.  Pulmonary:     Effort: Pulmonary effort is normal. No respiratory distress.  Abdominal:     General: There is no distension.     Tenderness: There is no abdominal tenderness.  Skin:    General: Skin is warm and dry.  Neurological:     General: No focal deficit present.     Mental Status: [name] alert. Mental status is at baseline.  Psychiatric:        Mood and Affect: Mood normal.        Behavior: Behavior normal.     ED Results / Procedures / Treatments   Labs (all labs ordered are listed, but only abnormal results are displayed) Labs Reviewed  CBC - Abnormal; Notable for the following components:      Result Value   RBC 3.68 (*)    Hemoglobin 11.4 (*)    HCT 32.1 (*)    All other components within normal limits  URINALYSIS, ROUTINE W REFLEX MICROSCOPIC - Abnormal; Notable for the following components:   Glucose, UA 100 (*)    Protein, ur TRACE (*)    All other components within normal limits  COMPREHENSIVE METABOLIC PANEL - Abnormal; Notable for the following components:   Chloride 96 (*)    Glucose, Bld 341 (*)    BUN 51 (*)    Creatinine, Ser 2.32 (*)    AST 133 (*)    ALT 134 (*)    Alkaline Phosphatase 141 (*)    GFR, Estimated 28 (*)    Anion gap 17 (*)    All other components within normal  limits  TROPONIN I (HIGH SENSITIVITY) - Abnormal; Notable for the following components:   Troponin I (High Sensitivity) 24 (*)    All other components within normal limits  TROPONIN I (HIGH SENSITIVITY) - Abnormal; Notable for the following components:   Troponin I (High Sensitivity) 25 (*)    All other components within normal limits  RESP PANEL BY RT-PCR (FLU A&B, COVID) ARPGX2  PREGNANCY, URINE  LIPASE, BLOOD  LIPID PANEL  BASIC  METABOLIC PANEL  CBG MONITORING, ED    EKG None  Radiology DG Chest Portable 1 View  Result Date: 02/06/2022 CLINICAL DATA:  31 year old female with abdominal pain, left upper quadrant pain. EXAM: PORTABLE CHEST 1 VIEW COMPARISON:  Noncontrast CT Abdomen and Pelvis today reported separately. FINDINGS: Chest radiographs 02/03/2022 and earlier. First identified on 01/29/2022, there is a small metallic sensor device projecting in the left lower lobe. This resembles a CardioMEMS Heart Failure Sensor. Lower lung volumes. Stable cardiac size and mediastinal contours. Cardiac size remains within normal limits. No pulmonary edema. Allowing for portable technique the lungs are clear. Visualized tracheal air column is within normal limits. Paucity of bowel gas in the upper abdomen. No acute osseous abnormality identified. IMPRESSION: 1. Lower lung volumes. No acute cardiopulmonary abnormality. 2. Stable suspected CardioMEMS Heart Failure Sensor projecting in the left lower lobe. Electronically Signed   By: Genevie Ann M.D.   On: 02/06/2022 04:40   CT ABDOMEN PELVIS WO CONTRAST  Result Date: 02/06/2022 CLINICAL DATA:  31 year old female with abdominal pain maximal in the left upper quadrant. Nausea vomiting and diarrhea. EXAM: CT ABDOMEN AND PELVIS WITHOUT CONTRAST TECHNIQUE: Multidetector CT imaging of the abdomen and pelvis was performed following the standard protocol without IV contrast. RADIATION DOSE REDUCTION: This exam was performed according to the departmental  dose-optimization program which includes automated exposure control, adjustment of the mA and/or kV according to patient size and/or use of iterative reconstruction technique. COMPARISON:  CT Abdomen and Pelvis 03/05/2021. FINDINGS: Lower chest: Negative. Hepatobiliary: Chronic hepatomegaly (approximately 25 cm liver length series 2, image 16) with heterogeneous density compatible with chronic hepatic steatosis, which in retrospect was suggested by generalized decreased liver density last year. Diminutive gallbladder. No perihepatic fluid. Pancreas: Negative noncontrast appearance. Spleen: Negative, no splenomegaly. Adrenals/Urinary Tract: Normal adrenal glands. Symmetric, nonobstructed kidneys with no nephrolithiasis or pararenal inflammation. Decompressed ureters. Mildly distended but otherwise unremarkable urinary bladder. No urinary calculus. Stomach/Bowel: Mildly redundant but otherwise negative large bowel. Mild retained stool. Normal appendix on coronal image 50. Decompressed terminal ileum. No dilated small bowel. Unremarkable stomach and duodenum. No free air or free fluid. Vascular/Lymphatic: Mild but age advanced Aortoiliac calcified atherosclerosis. Normal caliber abdominal aorta. Vascular patency is not evaluated in the absence of IV contrast. No lymphadenopathy identified. Reproductive: Uterus is diminutive or absent. Bilateral ovaries appears similar to last year, within normal limits. Other: No pelvic free fluid. Musculoskeletal: Negative. IMPRESSION: 1. No acute or inflammatory process identified in the non-contrast abdomen or pelvis, with normal appendix. 2. But positive for Hepatomegaly and evidence of chronic hepatic steatosis. No splenomegaly or explanation for left upper quadrant pain. 3. Also, age advanced atherosclerosis of the aorta and iliac arteries. Electronically Signed   By: Genevie Ann M.D.   On: 02/06/2022 04:37    Procedures .Critical Care  Performed by: Wyvonnia Dusky,  MD Authorized by: Wyvonnia Dusky, MD   Critical care provider statement:    Critical care time (minutes):  45   Critical care time was exclusive of:  Separately billable procedures and treating other patients   Critical care was necessary to treat or prevent imminent or life-threatening deterioration of the following conditions:  Endocrine crisis   Critical care was time spent personally by me on the following activities:  Ordering and performing treatments and interventions, ordering and review of laboratory studies, ordering and review of radiographic studies, pulse oximetry, review of old charts, examination of patient and evaluation of patient's response to treatment  Care discussed with: admitting provider   Comments:     Insulin infusion     Medications Ordered in ED Medications  insulin regular, human (MYXREDLIN) 100 units/ 100 mL infusion (has no administration in time range)  lactated ringers infusion (has no administration in time range)  dextrose 5 % in lactated ringers infusion (has no administration in time range)  dextrose 50 % solution 0-50 mL (has no administration in time range)  torsemide (DEMADEX) tablet 80 mg (has no administration in time range)  sildenafil (REVATIO) tablet 20 mg (has no administration in time range)  hydrALAZINE (APRESOLINE) tablet 25 mg (25 mg Oral Given 02/06/22 0848)  aspirin EC tablet 81 mg (81 mg Oral Given 02/06/22 0849)  levothyroxine (SYNTHROID) tablet 125 mcg (125 mcg Oral Given 02/06/22 0849)  rosuvastatin (CRESTOR) tablet 40 mg (has no administration in time range)  ivabradine (CORLANOR) tablet 7.5 mg (has no administration in time range)  ondansetron (ZOFRAN) injection 4 mg (4 mg Intravenous Given 02/05/22 2240)  sodium chloride 0.9 % bolus 500 mL (0 mLs Intravenous Stopped 02/06/22 0742)  HYDROmorphone (DILAUDID) injection 1 mg (1 mg Intravenous Given 02/06/22 0849)    ED Course/ Medical Decision Making/ A&P Clinical Course as of  02/06/22 0959  Sun Feb 06, 2022  0935 I spoke to Dr Oval Linsey from [facility] endocrinology by phone, who agreed with the medical plan and treatment for presumed hypertriglyceridemia with insulin infusion.  She did recommend holding the Humulin RU-500, which typically needs to be monitored and prescribed by an endocrinologist, and also should not be given while the patient is kept n.p.o.  The patient could instead be restarted on Regular Insulin U100 when she is transitioned off of the insulin infusion back to a regular diet, while in the hospital. [MT]    Clinical Course User Index [MT] Shanti Eichel, Carola Rhine, MD                           Medical Decision Making Amount and/or Complexity of Data Reviewed Labs: ordered.  Risk OTC drugs. Prescription drug management. Decision regarding hospitalization.   This patient presents to the ED with concern for abdominal pain,. This involves an extensive number of treatment options, and is a complaint that carries with it a high risk of complications and morbidity.  The differential diagnosis includes hypertriglyceridemia pancreatitis versus gastritis versus biliary disease versus other intra-abdominal process  Co-morbidities that complicate the patient evaluation: Congestive heart failure, fluid restricted diet, history of diabetes requiring insulin  I ordered and personally interpreted labs.  The pertinent results include: BMP with small anion gap.  UA with glucose.  BUN and creatinine are chronically elevated.  Triglyceride levels are still pending, but based on the CMP panel, it is very likely that the patient has concentrated levels and high triglycerides.  I ordered imaging studies including x-ray of the chest, CT abdomen pelvis I independently visualized and interpreted imaging which showed hepatomegaly, no other acute intra-abdominal process; stable cardiomegaly I agree with the radiologist interpretation  The patient was maintained on a cardiac  monitor.  I personally viewed and interpreted the cardiac monitored which showed an underlying rhythm of: Normal sinus rhythm  Per my interpretation the patient's ECG shows no acute ischemic findings per EKG performed yesterday  I ordered medication including insulin infusion for suspected hypertriglyceridemia, as blood is highly concentrated.  We will send a lipid profile.  Small fluid bolus given, we need to be judicious with  her history of significant heart failure.  She will be started on a 50 mL/h infusion as well.  Dilaudid ordered for pain.  Zofran was given for nausea triage with some improvement  I have reviewed the patients home medicines and have made adjustments as needed   I consulted with endocrinology from [place] by phone, please see ED course.  After the interventions noted above, I reevaluated the patient and found that they have: improved   Dispostion:  After consideration of the diagnostic results and the patients response to treatment, I feel that the patent would benefit from medical admission.  I have reordered her home medications, holding only her insulin, as we have her on insulin infusion.  This is per my discussion with endocrinology by phone.  Overall, given the complexity of her multiple medical comorbidities, the situation to best be treated in the hospital.  [name] requiring IV insulin and judicious IV fluids, with monitoring of her heart failure         Final Clinical Impression(s) / ED Diagnoses Final diagnoses:  Abdominal pain, unspecified abdominal location    Rx / DC Orders ED Discharge Orders     None         Makyla Bye, Carola Rhine, MD 02/06/22 [phone]

## 2022-02-06 NOTE — Assessment & Plan Note (Addendum)
Suspect this is pancreatitis despite normal lipase, no significant imaging findings, given similarity to previous episodes, epigastric pain across the abdomen and elevated triglycerides.    Non-contrasted CT unremarkable, but specificlaly no free air, no SBO, no gallstones, no hemorrhage, no abscess.  Does not appear to be gastroparesis.  - Trend lipase - Clears only - Correct hypertriglyceridemia - Analgesics and antiemetics - Obtain US abdomen in AM given diagnostic uncertainty about pancreatitis.

## 2022-02-06 NOTE — Progress Notes (Signed)
Plan of Care Note for accepted transfer   Patient: Jennifer [name] MRN: [id number]   DOA: 02/06/2022  Facility requesting transfer: [facility] Requesting Provider: Dr. Langston Masker Reason for transfer: abdominal pain Facility course: 31 yo F w/ PMHx of HTN, DM2, GERD, HFrEF, hypothyroidism, HLD, CKD 4. Presenting with abdominal pain, N/V. She has a complex history w/ recurrent pancreatitis that is typically d/t hypertriglyceridemia. Her symptoms reminded her of a pancreatitis flare. W/u revealed elevated glucose (341) and elevated Scr (2.32), elevated AST/ALT, but normal lipase. Her CT ab/pelvis did not reveal pancreatitis. However, lab noted that her blood was especially thick and required significant dilution. A lipid panel has been ordered and she was started on an insulin gtt. Of note, at baseline she is on significant amounts of insulin U-500 (290units TID and 100units qHS). Also, she has HFrEF w/ a restrictive fluid regimen (~1L/day). Her endocrinologist is at [facility]. I've asked the EDP to speak with them for additional recommendations (Re: hypertrigly and DM).   Plan of care: The patient is accepted for admission to [facility] unit, at [facility]  While holding at [facility], medical decision making responsibilities remain with the EDP. Upon arrival to [facility], [facility] will assume care. Thank you.   Author: Jonnie Finner, DO 02/06/2022  Check [email or web address] for on-call coverage.  Nursing staff, Please call [facility] number on Amion as soon as patient's arrival, so appropriate admitting provider can evaluate the pt.

## 2022-02-06 NOTE — Assessment & Plan Note (Signed)
-   Continue torsemide and sildenafil

## 2022-02-06 NOTE — ED Notes (Signed)
Called [facility] for consults requested by Dr. Langston Masker for patient

## 2022-02-06 NOTE — Assessment & Plan Note (Signed)
Cr stable relative to baseline 1.9-2.2

## 2022-02-06 NOTE — Hospital Course (Signed)
Jennifer [name] is a 31 y.o. F with hx astrocytoma s/p resection and chemo, hypertriglyceridemia and chronic pancreatitis, CKD IV baseline Cr 2.0, hypothyroidism, NICM and sCHF EF 40-45%, and DM who presented with 24 hours abdominal pain and vomiting.  In the ER, glucose >300, triglycerides >5000 (outpatient range appears to be between 150 and 1500).  Lipase normal and CT abdomen unremarkable.  Cr at baseline.  Admitted for presumed pancreatitis.

## 2022-02-06 NOTE — Assessment & Plan Note (Signed)
Reports dry weight 145 lbs.  Reports her Cardiomems says she is volume up.  Follows with Dr. Haroldine Laws. - Cautious fluids (for hypertri not dehydration) - Continue home torsemide - May need more diuresis - Daily weights, strict I/Os - COntinue home ivabradine, hydralazine, sildenafil

## 2022-02-06 NOTE — Assessment & Plan Note (Addendum)
Earlier in day, Dr. Marylyn Ishihara who accepted patient for admission spoke with [facility] and reported: "spoke to Dr Oval Linsey from [facility] endocrinology by phone, who agreed with the medical plan and treatment for presumed hypertriglyceridemia with insulin infusion.  She did recommend holding the Humulin RU-500, which typically needs to be monitored and prescribed by an endocrinologist, and also should not be given while the patient is kept n.p.o.  The patient could instead be restarted on Regular Insulin U100 when she is transitioned off of the insulin infusion back to a regular diet, while in the hospital." - Insulin infusion for now - Trend triglycerides - Continue fibrate and Crestor - Will plan to transition to subcutaneous insulin when triglycerides <1000

## 2022-02-06 NOTE — Assessment & Plan Note (Signed)
LFTs baseline range in last six months 80s-120s, stable now. - Trend LFTs

## 2022-02-06 NOTE — Progress Notes (Signed)
Repeat triglycerides are still >5000, Endotool still recommending holding insulin.  Stop D5 Start D10  Goal is to get insulin drip up to ~6-16 units/hr  Given hx CHF and patient report of feeling volume up, may have to use Lasix soon  Again, lipase normal and CT without evidence of pancreatitis, seeking alternative explanation for abdominal pain.   ADDENDUM 1:35AM: Glucose increasing with D10, able to get up to 8.5 u/hr.  If glucose trends further up, will reduce D10 rate.  Trend trigs q12

## 2022-02-06 NOTE — H&P (Addendum)
History and Physical    Patient: Jennifer [name] RFF:[id number] DOB: May 09, 1990 DOA: 02/06/2022 DOS: the patient was seen and examined on 02/06/2022 PCP: Sue Lush, PA-C  Patient coming from: Home  Chief Complaint:  Chief Complaint  Patient presents with   Abdominal Pain       HPI:  Jennifer [name] is a 31 y.o. F with hx astrocytoma s/p resection and chemo, hypertriglyceridemia and chronic pancreatitis, CKD IV baseline Cr 2.0, hypothyroidism, NICM and sCHF EF 40-45%, and DM who presented with 24 hours abdominal pain and vomiting.  In the ER, glucose >300, triglycerides >5000 (outpatient range appears to be between 150 and 1500).  Lipase normal and CT abdomen unremarkable.  Cr at baseline.  Admitted for presumed pancreatitis.      Review of Systems  Constitutional:  Negative for chills and fever.  Gastrointestinal:  Positive for abdominal pain, nausea and vomiting. Negative for blood in stool, constipation, diarrhea and melena.  All other systems reviewed and are negative.    Past Medical History:  Diagnosis Date   Brain tumor ([place]) 03/29/1995   astrocytoma   CHF (congestive heart failure) (HCC)    Cholesterosis    DM (diabetes mellitus) ([place]) 10/10/2018   Fatty liver    HTN (hypertension) 10/10/2018   Hypothyroidism 10/10/2018   Lipoprotein deficiency    Lung disease    longevity long term   Pancreatitis    Polycystic ovary syndrome    Past Surgical History:  Procedure Laterality Date   ABDOMINAL SURGERY     pt states during miscarriage got her intestine   BRAIN SURGERY     EYE MUSCLE SURGERY Right 03/28/2014   PRESSURE SENSOR/CARDIOMEMS N/A 12/06/2021   Procedure: PRESSURE SENSOR/CARDIOMEMS;  Surgeon: [name], MD;  Location: [facility];  Service: Cardiovascular;  Laterality: N/A;   RIGHT HEART CATH N/A 08/05/2021   Procedure: RIGHT HEART CATH;  Surgeon: [name], MD;  Location: [facility];  Service: Cardiovascular;   Laterality: N/A;   RIGHT HEART CATH N/A 12/06/2021   Procedure: RIGHT HEART CATH;  Surgeon: [name], MD;  Location: [facility];  Service: Cardiovascular;  Laterality: N/A;   RIGHT/LEFT HEART CATH AND CORONARY ANGIOGRAPHY N/A 12/03/2020   Procedure: RIGHT/LEFT HEART CATH AND CORONARY ANGIOGRAPHY;  Surgeon: [name], MD;  Location: [facility];  Service: Cardiovascular;  Laterality: N/A;   VENTRICULOSTOMY  03/28/1997   Social History:  reports that she has never smoked. She has never used smokeless tobacco. She reports that she does not drink alcohol and does not use drugs.  Allergies  Allergen Reactions   Ketamine Other (See Comments)    In vegetative state for 15 minutes per pt   Erythromycin    Fentanyl Nausea And Vomiting   Maitake Mushroom [M[place]] Itching    Itchy throat   Penicillin V Itching    Gi upset   Shellfish Allergy Other (See Comments)    Pt has never had shellfish but tested positive on allergy test. Pt states contrast in CT is okay   Morphine Itching and Rash   Penicillins Rash    Has patient had a PCN reaction causing immediate rash, facial/tongue/throat swelling, SOB or lightheadedness with hypotension: Y Has patient had a PCN reaction causing severe rash involving mucus membranes or skin necrosis: Y Has patient had a PCN reaction that required hospitalization: N Has patient had a PCN reaction occurring within the last 10 years: Y If all of  the above answers are "NO", then may proceed with Cephalosporin use.    Prednisone Rash    Family History  Problem Relation Age of Onset   Diabetes Mother    Hypertension Mother    Hyperlipidemia Mother    Thyroid disease Mother    Hypertension Father    Diabetes Father    Pancreatic cancer Paternal Aunt    Pancreatic cancer Paternal Uncle    Colon cancer Neg Hx    Esophageal cancer Neg Hx    Stomach cancer Neg Hx     Prior to Admission medications   Medication Sig Start Date End Date  Taking? Authorizing Provider  albuterol (VENTOLIN HFA) 108 (90 Base) MCG/ACT inhaler Inhale 1-2 puffs into the lungs every 6 (six) hours as needed for wheezing or shortness of breath. 12/31/18  Yes [provider]  aspirin EC 81 MG EC tablet Take 1 tablet (81 mg total) by mouth daily. Swallow whole. 12/07/20  Yes Nicole Kindred A, DO  DULoxetine (CYMBALTA) 30 MG capsule Take 30 mg by mouth at bedtime. 01/28/19 01/27/23 Yes [provider]  Evolocumab (REPATHA) 140 MG/ML SOSY Inject 140 mg into the skin See admin instructions. Inject 140 mg subcutaneously every other Sunday evening   Yes [provider]  fenofibrate 160 MG tablet Take 1 tablet (160 mg total) by mouth daily. 12/07/20  Yes Nicole Kindred A, DO  hydrALAZINE (APRESOLINE) 25 MG tablet Take 1 tablet (25 mg total) by mouth 3 (three) times daily. 12/15/21  Yes [name], Shaune Pascal, MD  insulin regular human CONCENTRATED (HUMULIN R) 500 UNIT/ML injection Inject 0.1-0.2 mLs (50-100 Units total) into the skin See admin instructions. 100 units with breakfast and lunch  50units at bedtime Patient taking differently: Inject 50-100 Units into the skin See admin instructions. 290 units with breakfast and lunch and dinner,  100units at bedtime 01/30/22  Yes Bero, Barth Kirks, MD  ivabradine (CORLANOR) 7.5 MG TABS tablet Take 1 tablet (7.5 mg total) by mouth 2 (two) times daily with a meal. 08/02/21  Yes [name], Shaune Pascal, MD  levothyroxine (SYNTHROID) 125 MCG tablet Take 1 tablet (125 mcg total) by mouth daily at 6 (six) AM. 12/07/20  Yes Nicole Kindred A, DO  loratadine (CLARITIN) 10 MG tablet Take 10 mg by mouth daily.   Yes [provider]  ondansetron (ZOFRAN) 4 MG tablet Take 4 mg by mouth every 8 (eight) hours as needed for nausea or vomiting. 12/19/18  Yes [provider]  pantoprazole (PROTONIX) 40 MG tablet Take 40 mg by mouth daily. 12/24/19  Yes [provider]  pregabalin (LYRICA) 150 MG capsule  Take 1 capsule (150 mg total) by mouth at bedtime. Patient taking differently: Take 300 mg by mouth at bedtime. 12/12/21  Yes Domenic Polite, MD  prochlorperazine (COMPAZINE) 5 MG tablet Take 5 mg by mouth daily.   Yes [provider]  rosuvastatin (CRESTOR) 40 MG tablet Take 40 mg by mouth daily. 08/09/21  Yes [provider]  sildenafil (REVATIO) 20 MG tablet Take 1 tablet (20 mg total) by mouth 3 (three) times daily. 12/15/21  Yes [name], Shaune Pascal, MD  torsemide (DEMADEX) 20 MG tablet Take 4 tablets (80 mg total) by mouth daily. 12/21/21  Yes Clegg, [name] D, NP  tretinoin (RETIN-A) 0.05 % cream Apply 1 application topically at bedtime. 01/17/21  Yes [provider]  [name] 1/20 1-20 MG-MCG tablet Take 1 tablet by mouth daily. 11/01/21   [provider]  Physical Exam: Vitals:   02/06/22 1700 02/06/22 1800 02/06/22 1900 02/06/22 2000  BP: (!) 146/81 (!) 178/103  (!) 162/103  Pulse:  (!) 111 (!) 115 (!) 115  Resp:  18 19 16   Temp:  98.1 F (36.7 C)  98 F (36.7 C)  TempSrc:  Axillary  Axillary  SpO2:  98% 91% 96%  Weight:  71.8 kg    Height:  4' 9"  (1.448 m)     Obese adult female, lying in bed, no acute distress, interactive and appropriate, eating in ICU Tachycardic, regular, no murmurs, she has nonpitting edema in all 4 extremities, and face appears a little puffy, jugular veins not visible due to body habitus Respiratory rate normal, lung sounds clear without rales or wheezes Abdomen with diffuse tenderness across the epigastrium, no guarding, no voluntary or involuntary guarding, no rigidity, no ascites, no distention Attention normal, affect appropriate, mood normal Face with some droop of the right eye, at baseline, speech fluent, moves upper extremities with normal strength and coordination      Data Reviewed: Basic metabolic panel shows creatinine 2.3 down to 2.2 with IV fluids, stable relative to baseline, elevated anion gap LFTs  reviewed, slight elevation, close to baseline CBC shows mild anemia Urinalysis unremarkable Troponin 25, no evidence of ischemia Lipase normal Triglycerides greater than 5000 COVID-negative Chest x-ray personally reviewed, no airspace disease or opacity CT of the abdomen and pelvis without contrast shows no pancreatitis, but does show steatosis of the liver      Assessment and Plan: * Abdominal pain Suspect this is pancreatitis despite normal lipase, no significant imaging findings, given similarity to previous episodes, epigastric pain across the abdomen and elevated triglycerides.    Non-contrasted CT unremarkable, but specificlaly no free air, no SBO, no gallstones, no hemorrhage, no abscess.  Does not appear to be gastroparesis.  - Trend lipase - Clears only - Correct hypertriglyceridemia - Analgesics and antiemetics - Obtain US abdomen in AM given diagnostic uncertainty about pancreatitis.  - ? If evinacumab/Evkeeza is warranted    Hypertriglyceridemia Earlier in day, Dr. Marylyn Ishihara who accepted patient for admission spoke with [facility] and reported: "spoke to Dr Oval Linsey from [facility] endocrinology by phone, who agreed with the medical plan and treatment for presumed hypertriglyceridemia with insulin infusion.  She did recommend holding the Humulin RU-500, which typically needs to be monitored and prescribed by an endocrinologist, and also should not be given while the patient is kept n.p.o.  The patient could instead be restarted on Regular Insulin U100 when she is transitioned off of the insulin infusion back to a regular diet, while in the hospital." - Insulin infusion for now - Trend triglycerides - Continue fibrate and Crestor - Will plan to transition to subcutaneous insulin when triglycerides <1000    Obesity (BMI 30-39.9) BMI 34  NASH (nonalcoholic steatohepatitis) LFTs baseline range in last six months 80s-120s, stable now. - Trend LFTs  Pulmonary  hypertension (HCC) - Continue torsemide and sildenafil  CKD (chronic kidney disease) stage 4, GFR 15-29 ml/min (HCC) Cr stable relative to baseline 1.9-2.2  Essential hypertension BP elevated - Continue hydralazine, torsemide  Chronic systolic CHF (congestive heart failure) (HCC) Reports dry weight 145 lbs.  Reports her Cardiomems says she is volume up.  Follows with Dr. Haroldine Laws. - Cautious fluids (for hypertri not dehydration) - Continue home torsemide - May need more diuresis - Daily weights, strict I/Os - COntinue home ivabradine, hydralazine, sildenafil   Hypothyroidism - Continue levothyroxine  Type 2  diabetes mellitus with hyperlipidemia ([place]) Hyperglycemic on admission, resolved with insulin drip. - See above note re: endocrinology recommendations for U100 vs U500 - Continue insulin drip for now - Transition to U100 when trigs <1000         Advance Care Planning: Full code  Consults: None needed  Family Communication: None present  Severity of Illness: The appropriate patient status for this patient is INPATIENT. Inpatient status is judged to be reasonable and necessary in order to provide the required intensity of service to ensure the patient's safety. The patient's presenting symptoms, physical exam findings, and initial radiographic and laboratory data in the context of their chronic comorbidities is felt to place them at high risk for further clinical deterioration. Furthermore, it is not anticipated that the patient will be medically stable for discharge from the hospital within 2 midnights of admission.   * I certify that at the point of admission it is my clinical judgment that the patient will require inpatient hospital care spanning beyond 2 midnights from the point of admission due to high intensity of service, high risk for further deterioration and high frequency of surveillance required.*  Author: Edwin Dada, MD 02/06/2022 8:31  PM  For on call review [email or web address].

## 2022-02-06 NOTE — Assessment & Plan Note (Signed)
Continue levothyroxine 

## 2022-02-07 ENCOUNTER — Telehealth (HOSPITAL_COMMUNITY): Payer: Self-pay | Admitting: Adult Health

## 2022-02-07 ENCOUNTER — Inpatient Hospital Stay (HOSPITAL_COMMUNITY): Payer: BC Managed Care – PPO

## 2022-02-07 DIAGNOSIS — I272 Pulmonary hypertension, unspecified: Secondary | ICD-10-CM

## 2022-02-07 DIAGNOSIS — E781 Pure hyperglyceridemia: Secondary | ICD-10-CM | POA: Diagnosis not present

## 2022-02-07 DIAGNOSIS — K7581 Nonalcoholic steatohepatitis (NASH): Secondary | ICD-10-CM

## 2022-02-07 DIAGNOSIS — I10 Essential (primary) hypertension: Secondary | ICD-10-CM | POA: Diagnosis not present

## 2022-02-07 DIAGNOSIS — N184 Chronic kidney disease, stage 4 (severe): Secondary | ICD-10-CM | POA: Diagnosis not present

## 2022-02-07 DIAGNOSIS — R1013 Epigastric pain: Secondary | ICD-10-CM | POA: Diagnosis not present

## 2022-02-07 LAB — GLUCOSE, CAPILLARY
Glucose-Capillary: 167 mg/dL — ABNORMAL HIGH (ref 70–99)
Glucose-Capillary: 189 mg/dL — ABNORMAL HIGH (ref 70–99)
Glucose-Capillary: 190 mg/dL — ABNORMAL HIGH (ref 70–99)
Glucose-Capillary: 194 mg/dL — ABNORMAL HIGH (ref 70–99)
Glucose-Capillary: 197 mg/dL — ABNORMAL HIGH (ref 70–99)
Glucose-Capillary: 198 mg/dL — ABNORMAL HIGH (ref 70–99)
Glucose-Capillary: 200 mg/dL — ABNORMAL HIGH (ref 70–99)
Glucose-Capillary: 204 mg/dL — ABNORMAL HIGH (ref 70–99)
Glucose-Capillary: 206 mg/dL — ABNORMAL HIGH (ref 70–99)
Glucose-Capillary: 211 mg/dL — ABNORMAL HIGH (ref 70–99)
Glucose-Capillary: 212 mg/dL — ABNORMAL HIGH (ref 70–99)
Glucose-Capillary: 213 mg/dL — ABNORMAL HIGH (ref 70–99)
Glucose-Capillary: 222 mg/dL — ABNORMAL HIGH (ref 70–99)
Glucose-Capillary: 223 mg/dL — ABNORMAL HIGH (ref 70–99)
Glucose-Capillary: 226 mg/dL — ABNORMAL HIGH (ref 70–99)
Glucose-Capillary: 240 mg/dL — ABNORMAL HIGH (ref 70–99)
Glucose-Capillary: 241 mg/dL — ABNORMAL HIGH (ref 70–99)
Glucose-Capillary: 246 mg/dL — ABNORMAL HIGH (ref 70–99)
Glucose-Capillary: 265 mg/dL — ABNORMAL HIGH (ref 70–99)
Glucose-Capillary: 276 mg/dL — ABNORMAL HIGH (ref 70–99)

## 2022-02-07 LAB — COMPREHENSIVE METABOLIC PANEL
ALT: 118 U/L — ABNORMAL HIGH (ref 0–44)
AST: 115 U/L — ABNORMAL HIGH (ref 15–41)
Albumin: 3.8 g/dL (ref 3.5–5.0)
Alkaline Phosphatase: 130 U/L — ABNORMAL HIGH (ref 38–126)
Anion gap: 20 — ABNORMAL HIGH (ref 5–15)
BUN: 46 mg/dL — ABNORMAL HIGH (ref 6–20)
CO2: 18 mmol/L — ABNORMAL LOW (ref 22–32)
Calcium: 9.3 mg/dL (ref 8.9–10.3)
Chloride: 97 mmol/L — ABNORMAL LOW (ref 98–111)
Creatinine, Ser: 2.3 mg/dL — ABNORMAL HIGH (ref 0.44–1.00)
GFR, Estimated: 29 mL/min — ABNORMAL LOW (ref >60)
Glucose, Bld: 208 mg/dL — ABNORMAL HIGH (ref 70–99)
Potassium: 4.6 mmol/L (ref 3.5–5.1)
Sodium: 135 mmol/L (ref 135–145)
Total Bilirubin: 0.9 mg/dL (ref 0.3–1.2)
Total Protein: 6.7 g/dL (ref 6.5–8.1)

## 2022-02-07 LAB — TRIGLYCERIDES
Triglycerides: >5000 mg/dL — ABNORMAL HIGH
Triglycerides: >5000 mg/dL — ABNORMAL HIGH

## 2022-02-07 LAB — CBC
HCT: 30.4 % — ABNORMAL LOW (ref 36.0–46.0)
Hemoglobin: 10.2 g/dL — ABNORMAL LOW (ref 12.0–15.0)
MCH: 30.7 pg (ref 26.0–34.0)
MCHC: 33.6 g/dL (ref 30.0–36.0)
MCV: 91.6 fL (ref 80.0–100.0)
Platelets: 159 10*3/uL (ref 150–400)
RBC: 3.32 MIL/uL — ABNORMAL LOW (ref 3.87–5.11)
RDW: 15.8 % — ABNORMAL HIGH (ref 11.5–15.5)
WBC: 6.4 10*3/uL (ref 4.0–10.5)
nRBC: 0 % (ref 0.0–0.2)

## 2022-02-07 LAB — LIPASE, BLOOD: Lipase: 23 U/L (ref 11–51)

## 2022-02-07 LAB — HIV ANTIBODY (ROUTINE TESTING W REFLEX): HIV Screen 4th Generation wRfx: NONREACTIVE

## 2022-02-07 MED ORDER — INSULIN (MYXREDLIN) INFUSION FOR HYPERTRIGLYCERIDEMIA
0.1500 [IU]/kg/h | INTRAVENOUS | Status: DC
  Administered 2022-02-07 – 2022-02-13 (×14): 0.15 [IU]/kg/h via INTRAVENOUS
  Filled 2022-02-07 (×16): qty 100

## 2022-02-07 MED ORDER — IPRATROPIUM-ALBUTEROL 0.5-2.5 (3) MG/3ML IN SOLN: 3.0000 mL | Freq: Four times a day (QID) | RESPIRATORY_TRACT | Status: DC | PRN

## 2022-02-07 MED ORDER — ALBUTEROL SULFATE (2.5 MG/3ML) 0.083% IN NEBU
2.5000 mg | INHALATION_SOLUTION | Freq: Four times a day (QID) | RESPIRATORY_TRACT | Status: DC | PRN
  Administered 2022-02-07: 2.5 mg via RESPIRATORY_TRACT
  Filled 2022-02-07: qty 3

## 2022-02-07 MED ORDER — DIPHENHYDRAMINE HCL 25 MG PO CAPS
25.0000 mg | ORAL_CAPSULE | Freq: Once | ORAL | Status: AC | PRN
  Administered 2022-02-07: 25 mg via ORAL
  Filled 2022-02-07: qty 1

## 2022-02-07 MED ORDER — ALBUTEROL SULFATE HFA 108 (90 BASE) MCG/ACT IN AERS
1.0000 | INHALATION_SPRAY | Freq: Four times a day (QID) | RESPIRATORY_TRACT | Status: DC | PRN
  Administered 2022-02-07: 2 via RESPIRATORY_TRACT
  Filled 2022-02-07: qty 6.7

## 2022-02-07 NOTE — TOC Initial Note (Signed)
Transition of Care [facility]) - Initial/Assessment Note    Patient Details  Name: Jennifer [name] MRN: [id number] Date of Birth: 1990-12-21  Transition of Care [facility]) CM/SW Contact:    Leeroy Cha, RN Phone Number: 02/07/2022, 7:56 AM  Clinical Narrative:                  Transition of Care [facility]) Screening Note   Patient Details  Name: Jennifer [name] Date of Birth: 10/06/1990   Transition of Care [facility]) CM/SW Contact:    Leeroy Cha, RN Phone Number: 02/07/2022, 7:56 AM    Transition of Care Department [facility]) has reviewed patient and no TOC needs have been identified at this time. We will continue to monitor patient advancement through interdisciplinary progression rounds. If new patient transition needs arise, please place a TOC consult.    Expected Discharge Plan: Home/Self Care Barriers to Discharge: Continued Medical Work up   Patient Goals and CMS Choice Patient states their goals for this hospitalization and ongoing recovery are:: to go home CMS Medicare.gov Compare Post Acute Care list provided to:: Patient    Expected Discharge Plan and Services Expected Discharge Plan: Home/Self Care   Discharge Planning Services: CM Consult   Living arrangements for the past 2 months: Apartment                                      Prior Living Arrangements/Services Living arrangements for the past 2 months: Apartment Lives with:: Spouse Patient language and need for interpreter reviewed:: Yes Do you feel safe going back to the place where you live?: Yes            Criminal Activity/Legal Involvement Pertinent to Current Situation/Hospitalization: No - Comment as needed  Activities of Daily Living Home Assistive Devices/Equipment: Eyeglasses ADL Screening (condition at time of admission) Patient's cognitive ability adequate to safely complete daily activities?: Yes Is the patient deaf or have difficulty hearing?: No Does the patient have  difficulty seeing, even when wearing glasses/contacts?: No Does the patient have difficulty concentrating, remembering, or making decisions?: No Patient able to express need for assistance with ADLs?: Yes Does the patient have difficulty dressing or bathing?: No Independently performs ADLs?: Yes (appropriate for developmental age) Does the patient have difficulty walking or climbing stairs?: No Weakness of Legs: Left Weakness of Arms/Hands: Left  Permission Sought/Granted                  Emotional Assessment Appearance:: Appears stated age Attitude/Demeanor/Rapport: Engaged Affect (typically observed): Calm Orientation: : Oriented to Self, Oriented to Place, Oriented to  Time, Oriented to Situation Alcohol / Substance Use: Never Used Psych Involvement: No (comment)  Admission diagnosis:  Abdominal pain [R10.9] Abdominal pain, unspecified abdominal location [R10.9] Patient Active Problem List   Diagnosis Date Noted   Pulmonary hypertension ([place]) 02/06/2022   NASH (nonalcoholic steatohepatitis) 02/06/2022   Obesity (BMI 30-39.9) 02/06/2022   Heart failure ([place]) 12/03/2021   Acute on chronic systolic CHF (congestive heart failure) ([place]) 12/02/2021   Elevated troponin 12/02/2021   Pancytopenia ([place]) 12/02/2021   Amenorrhea 12/02/2021   Hyperosmolar hyperglycemic state (HHS) ([place]) 08/26/2021   Small intestinal bacterial overgrowth (SIBO) 08/26/2021   Lower extremity edema 08/26/2021   Hyperkalemia 08/14/2021   Hx of insulin dependent diabetes mellitus 08/14/2021   CKD (chronic kidney disease) stage 4, GFR 15-29 ml/min ([place]) 05/13/2021   Chest pain 03/10/2021  Acute on chronic combined systolic and diastolic CHF (congestive heart failure) ([place]) 02/09/2021   History of astrocytoma of brain 02/09/2021   Severe hyperglycemia due to diabetes mellitus ([place]) 01/25/2021   Diarrhea    Elevated transaminase level    Acute combined systolic and diastolic heart failure (HCC)     Nonischemic cardiomyopathy ([place])    Acute decompensated heart failure (HCC)    Elevated liver enzymes    Acute renal failure superimposed on stage 3a chronic kidney disease ([place]) 11/26/2020   Abdominal pain 75/88/3254   Chronic systolic CHF (congestive heart failure) ([place]) 11/23/2020   Essential hypertension 11/23/2020   Hypertriglyceridemia 11/23/2020   Type 2 diabetes mellitus with hyperlipidemia ([place]) 10/10/2018   Hypothyroidism 10/10/2018   PCP:  Sue Lush, PA-C Pharmacy:   [id number] - [address] [place] [address] [address] Phone: [phone] Fax: [phone]  [id number] [address] [place] [address]Phone: [phone]: [phone]    Social Determinants of Health (SDOH) Interventions    Readmission Risk Interventions   Row Labels 12/07/2021    4:36 PM  Readmission Risk Prevention Plan   Section Header. No data exists in this row.   Transportation Screening   Complete  Medication Review (Press photographer   Complete  PCP or Specialist appointment within 3-5 days of discharge   Complete  [place]RI or [place]  Complete  SW Recovery Care/Counseling Consult   Complete  [place]  Not Applicable

## 2022-02-07 NOTE — Telephone Encounter (Signed)
Error

## 2022-02-07 NOTE — Inpatient Diabetes Management (Signed)
Inpatient Diabetes Program Recommendations  AACE/ADA: New Consensus Statement on Inpatient Glycemic Control (2015)  Target Ranges:  Prepandial:   less than 140 mg/dL      Peak postprandial:   less than 180 mg/dL (1-2 hours)      Critically ill patients:  140 - 180 mg/dL   Lab Results  Component Value Date   GLUCAP 226 (H) 02/07/2022   HGBA1C 7.9 (H) 08/15/2021     Diabetes history: DM Type 3c (per [facility]) Outpatient Diabetes medications: U-500 270 units TID, 100 units at HS Current orders for Inpatient glycemic control: IV insulin D10% @ 50 ml/hr  Inpatient Diabetes Program Recommendations:    Noted consult. Familiar with patient from previous hospitalizations. In agreement with current plan of care: continue IV insulin.   Thanks, Bronson Curb, MSN, RNC-OB Diabetes Coordinator [phone] (8a-5p)

## 2022-02-07 NOTE — Progress Notes (Signed)
PROGRESS NOTE  Jennifer [name] GNF:[id number] DOB: [birth year] DOA: 02/06/2022 PCP: Sue Lush, PA-C  HPI/Recap of past 24 hours: 31 yo F w/ PMHx of HTN, DM2, GERD, HFrEF, hypothyroidism, HLD, CKD 4, presenting with abdominal pain, N/V. She has a complex history w/ recurrent pancreatitis that is typically d/t hypertriglyceridemia. W/u revealed elevated glucose (341) and elevated Scr (2.32), elevated AST/ALT, but normal lipase. Her CT ab/pelvis did not reveal pancreatitis. Lipid panel revealed >5000 TG. Pt was started on insulin gtt. Of note, at baseline she is on significant amounts of insulin U-500 (290units TID and 100units qHS). Also, she has HFrEF w/ a restrictive fluid regimen (~1L/day). Her endocrinologist is at [facility]. Patient admitted for further management.    Today, pt noted to have some facial puffiness, denies any worsening shortness of breath, dyspnea, does note to have bilateral expiratory wheezing, chest pain, reports some generalized abdominal pain, nausea/vomiting has since resolved.  Complained about some mild generalized itching, prescribed a one-time dose of Benadryl    Assessment/Plan: Principal Problem:   Abdominal pain Active Problems:   Hypertriglyceridemia   Type 2 diabetes mellitus with hyperlipidemia (HCC)   Hypothyroidism   Chronic systolic CHF (congestive heart failure) (HCC)   Essential hypertension   CKD (chronic kidney disease) stage 4, GFR 15-29 ml/min (HCC)   Pulmonary hypertension (HCC)   NASH (nonalcoholic steatohepatitis)   Obesity (BMI 30-39.9)   Abdominal pain likely 2/2 ?pancreatitis Vs ?gastroparesis Normal lipase, given similarity to previous episodes, epigastric pian, elevated triglycerides CT abd/pelvis unremarkable USS abdomen showed hepatic steatosis, mild splenomegaly, otherwise unremarkable Correct hypertriglyceridemia as below Pain management, antiemetics   Hypertriglyceridemia Patient's endocrinology Dr Oval Linsey from [facility]  agreed with the medical plan and treatment for presumed hypertriglyceridemia with insulin infusion. Recommended holding the Humulin RU-500, which typically needs to be monitored and prescribed by an endocrinologist, and also should not be given while the patient is kept n.p.o. The patient could instead be restarted on Regular Insulin U100 when she is transitioned off of the insulin infusion back to a regular diet, while in the hospital." Changed Insulin infusion for triglyceride protocol specifically  Trend triglycerides, transition from insulin drip to [employer] insulin when TG <1000 Continue fibrate and Crestor  Type 2 diabetes mellitus with hyperlipidemia (HCC) Hyperglycemic on admission, resolved with insulin drip See above re: endocrinology recommendations for U100 vs U500 Continue insulin drip for now Transition to U100 when trigs <1000  NASH (nonalcoholic steatohepatitis) Mildly elevated LFTs, AST/ALT LFTs baseline range in last six months 80s-120s Daily CMP  Chronic systolic CHF (congestive heart failure) ([place]) Has Cardiomems, follows with Dr. Haroldine Laws Gentle IV fluids Continue home torsemide Daily weights, strict I/Os Continue home ivabradine, hydralazine, sildenafil  CKD (chronic kidney disease) stage 4, GFR 15-29 ml/min (HCC) Cr stable relative to baseline 1.9-2.2 Daily CMP  Normocytic anemia Hgb at baseline Daily CBC  Essential hypertension BP stable Continue hydralazine, torsemide  Pulmonary hypertension (HCC) Continue torsemide and sildenafil  Hypothyroidism Continue levothyroxine   Obesity (BMI 30-39.9) Lifestyle modification advised         Estimated body mass index is 33.8 kg/m as calculated from the following:   Height as of this encounter: 4' 9"  (1.448 m).   Weight as of this encounter: 70.9 kg.     Code Status: Full  Family Communication: None at bedside  Disposition Plan: Status is: Inpatient Remains inpatient appropriate because: Level of  care      Consultants: None  Procedures: None  Antimicrobials: None  DVT  prophylaxis: Lovenox   Objective: Vitals:   02/07/22 1000 02/07/22 1100 02/07/22 1200 02/07/22 1300  BP: (!) 155/100 (!) 147/81 137/71 (!) 147/75  Pulse: 93 91 88 86  Resp: 16 14 14 16   Temp:      TempSrc:      SpO2: 100% 100% 100% 100%  Weight:      Height:        Intake/Output Summary (Last 24 hours) at 02/07/2022 1326 Last data filed at 02/07/2022 1300 Gross per 24 hour  Intake 741.62 ml  Output 1050 ml  Net -308.38 ml   Filed Weights   02/05/22 2225 02/06/22 1800 02/07/22 0620  Weight: 67.6 kg 71.8 kg 70.9 kg    Exam: General: NAD, alert/awake/oriented, facial puffiness Cardiovascular: S1, S2 present Respiratory: Bilateral expiratory wheezing noted Abdomen: Soft, +tender across epigastric region, nondistended, bowel sounds present Musculoskeletal: No bilateral pedal edema noted Skin: Normal Psychiatry: Normal mood     Data Reviewed: CBC: Recent Labs  Lab 02/03/22 1641 02/05/22 2240 02/07/22 0529  WBC 6.3 5.1 6.4  HGB 10.7* 11.4* 10.2*  HCT 32.3* 32.1* 30.4*  MCV 86.8 87.2 91.6  PLT 172 182 063   Basic Metabolic Panel: Recent Labs  Lab 02/03/22 1641 02/05/22 2310 02/06/22 0843 02/06/22 1905 02/07/22 0529  NA 145 137 140 141 135  K 4.7 4.4 3.5 4.4 4.6  CL 106 96* 100 101 97*  CO2 19* 24 21* 20* 18*  GLUCOSE 152* 341* 81 95 208*  BUN 64* 51* 49* 47* 46*  CREATININE 1.99* 2.32* 2.19* 2.07* 2.30*  CALCIUM 9.9 9.3 9.4 9.6 9.3  MG  --   --   --  2.0  --    GFR: Estimated Creatinine Clearance: 29.1 mL/min (A) (by C-G formula based on SCr of 2.3 mg/dL (H)). Liver Function Tests: Recent Labs  Lab 02/03/22 1641 02/05/22 2310 02/07/22 0529  AST 107* 133* 115*  ALT 119* 134* 118*  ALKPHOS 141* 141* 130*  BILITOT 0.7 1.0 0.9  PROT 7.6 7.0 6.7  ALBUMIN 4.2 4.0 3.8   Recent Labs  Lab 02/05/22 2310 02/07/22 0529  LIPASE 34 23   No results for input(s):  "AMMONIA" in the last 168 hours. Coagulation Profile: No results for input(s): "INR", "PROTIME" in the last 168 hours. Cardiac Enzymes: No results for input(s): "CKTOTAL", "CKMB", "CKMBINDEX", "TROPONINI" in the last 168 hours. BNP (last 3 results) No results for input(s): "PROBNP" in the last 8760 hours. HbA1C: No results for input(s): "HGBA1C" in the last 72 hours. CBG: Recent Labs  Lab 02/07/22 0906 02/07/22 1009 02/07/22 1025 02/07/22 1115 02/07/22 1319  GLUCAP 226* 211* 200* 204* 167*   Lipid Profile: Recent Labs    02/06/22 0843 02/06/22 1905 02/07/22 0529  CHOL 714*  --   --   HDL NOT REPORTED DUE TO HIGH TRIGLYCERIDES  --   --   LDLCALC UNABLE TO CALCULATE IF TRIGLYCERIDE OVER 400 mg/dL  --   --   TRIG >5,000* >5,000* >5,000*  CHOLHDL NOT REPORTED DUE TO HIGH TRIGLYCERIDES  --   --   LDLDIRECT UNABLE TO CALCULATE IF TRIGLYCERIDE IS >1293 mg/dL  --   --    Thyroid Function Tests: No results for input(s): "TSH", "T4TOTAL", "FREET4", "T3FREE", "THYROIDAB" in the last 72 hours. Anemia Panel: No results for input(s): "VITAMINB12", "FOLATE", "FERRITIN", "TIBC", "IRON", "RETICCTPCT" in the last 72 hours. Urine analysis:    Component Value Date/Time   COLORURINE YELLOW 02/05/2022 [place] 02/05/2022 2240  LABSPEC 1.013 02/05/2022 2240   PHURINE 5.0 02/05/2022 2240   GLUCOSEU 100 (A) 02/05/2022 2240   HGBUR NEGATIVE 02/05/2022 2240   BILIRUBINUR NEGATIVE 02/05/2022 2240   KETONESUR NEGATIVE 02/05/2022 2240   PROTEINUR TRACE (A) 02/05/2022 2240   NITRITE NEGATIVE 02/05/2022 2240   LEUKOCYTESUR NEGATIVE 02/05/2022 2240   Sepsis Labs: @LABRCNTIP (procalcitonin:4,lacticidven:4)  ) Recent Results (from the past 240 hour(s))  Resp Panel by RT-PCR (Flu A&B, Covid) Anterior Nasal Swab     Status: None   Collection Time: 02/06/22  6:17 AM   Specimen: Anterior Nasal Swab  Result Value Ref Range Status   SARS Coronavirus 2 by RT PCR NEGATIVE NEGATIVE  Final    Comment: (NOTE) SARS-CoV-2 target nucleic acids are NOT DETECTED.  The SARS-CoV-2 RNA is generally detectable in upper respiratory specimens during the acute phase of infection. The lowest concentration of SARS-CoV-2 viral copies this assay can detect is 138 copies/mL. A negative result does not preclude SARS-Cov-2 infection and should not be used as the sole basis for treatment or other patient management decisions. A negative result may occur with  improper specimen collection/handling, submission of specimen other than nasopharyngeal swab, presence of viral mutation(s) within the areas targeted by this assay, and inadequate number of viral copies(<138 copies/mL). A negative result must be combined with clinical observations, patient history, and epidemiological information. The expected result is Negative.  Fact Sheet for Patients:  EntrepreneurPulse.com.au  Fact Sheet for Healthcare Providers:  IncredibleEmployment.be  This test is no t yet approved or cleared by the [place] FDA and  has been authorized for detection and/or diagnosis of SARS-CoV-2 by FDA under an Emergency Use Authorization (EUA). This EUA will remain  in effect (meaning this test can be used) for the duration of the COVID-19 declaration under Section 564(b)(1) of the Act, 21 U.S.C.section 360bbb-3(b)(1), unless the authorization is terminated  or revoked sooner.       Influenza A by PCR NEGATIVE NEGATIVE Final   Influenza B by PCR NEGATIVE NEGATIVE Final    Comment: (NOTE) The Xpert Xpress SARS-CoV-2/FLU/RSV plus assay is intended as an aid in the diagnosis of influenza from Nasopharyngeal swab specimens and should not be used as a sole basis for treatment. Nasal washings and aspirates are unacceptable for Xpert Xpress SARS-CoV-2/FLU/RSV testing.  Fact Sheet for Patients: EntrepreneurPulse.com.au  Fact Sheet for Healthcare  Providers: IncredibleEmployment.be  This test is not yet approved or cleared by the [place] FDA and has been authorized for detection and/or diagnosis of SARS-CoV-2 by FDA under an Emergency Use Authorization (EUA). This EUA will remain in effect (meaning this test can be used) for the duration of the COVID-19 declaration under Section 564(b)(1) of the Act, 21 U.S.C. section 360bbb-3(b)(1), unless the authorization is terminated or revoked.  Performed at [facility], [address] [place], [place] [address]   MRSA Next Gen by PCR, Nasal     Status: None   Collection Time: 02/06/22  6:37 PM   Specimen: Nasal Mucosa; Nasal Swab  Result Value Ref Range Status   MRSA by PCR Next Gen NOT DETECTED NOT DETECTED Final    Comment: (NOTE) The GeneXpert MRSA Assay (FDA approved for NASAL specimens only), is one component of a comprehensive MRSA colonization surveillance program. It is not intended to diagnose MRSA infection nor to guide or monitor treatment for MRSA infections. Test performance is not FDA approved in patients less than 64 years old. Performed at [facility], [address] [place], [place] [address]       Studies: US Abdomen Complete  Result Date: 02/07/2022 CLINICAL DATA:  Acute generalized abdominal pain. EXAM: ABDOMEN ULTRASOUND COMPLETE COMPARISON:  February 06, 2022.  November 25, 2021. FINDINGS: Gallbladder: No gallstones or wall thickening visualized. No sonographic Murphy sign noted by sonographer. Common bile duct: Diameter: 4 mm which is within normal limits. Liver: No focal lesion identified. Increased echogenicity of hepatic parenchyma is noted suggesting hepatic steatosis. Portal vein is patent on color Doppler imaging with normal direction of blood flow towards the liver. IVC: No abnormality visualized. Pancreas: Visualized portion unremarkable. Spleen: Calculated volume of 526 cc consistent with  mild splenomegaly. Right Kidney: Length: 10.6 cm. Echogenicity within normal limits. No mass or hydronephrosis visualized. Left Kidney: Length: 9.4 cm. 8 mm cyst is noted. Echogenicity within normal limits. No mass or hydronephrosis visualized. Abdominal aorta: No aneurysm visualized. Other findings: None. IMPRESSION: Hepatic steatosis. Mild splenomegaly. No other significant abnormality seen in the abdomen. Electronically Signed   By: Marijo Conception M.D.   On: 02/07/2022 08:21    Scheduled Meds:  aspirin EC  81 mg Oral Daily   Chlorhexidine Gluconate Cloth  6 each Topical Daily   DULoxetine  30 mg Oral QHS   enoxaparin (LOVENOX) injection  40 mg Subcutaneous Q24H   fenofibrate  160 mg Oral Daily   hydrALAZINE  25 mg Oral Q8H   ivabradine  7.5 mg Oral BID WC   levothyroxine  125 mcg Oral Q0600   pregabalin  300 mg Oral QHS   rosuvastatin  40 mg Oral Daily   sildenafil  20 mg Oral TID   torsemide  80 mg Oral Daily    Continuous Infusions:  dextrose 50 mL/hr at 02/07/22 1300   insulin 0.15 Units/kg/hr (02/07/22 1300)     LOS: 1 day     Alma Friendly, MD [facility]  If 7PM-7AM, please contact night-coverage [email or web address] 02/07/2022, 1:26 PM

## 2022-02-08 ENCOUNTER — Inpatient Hospital Stay (HOSPITAL_COMMUNITY): Payer: BC Managed Care – PPO

## 2022-02-08 DIAGNOSIS — R1013 Epigastric pain: Secondary | ICD-10-CM | POA: Diagnosis not present

## 2022-02-08 DIAGNOSIS — E781 Pure hyperglyceridemia: Secondary | ICD-10-CM | POA: Diagnosis not present

## 2022-02-08 DIAGNOSIS — I10 Essential (primary) hypertension: Secondary | ICD-10-CM | POA: Diagnosis not present

## 2022-02-08 DIAGNOSIS — N184 Chronic kidney disease, stage 4 (severe): Secondary | ICD-10-CM | POA: Diagnosis not present

## 2022-02-08 LAB — TRIGLYCERIDES
Triglycerides: 4580 mg/dL — ABNORMAL HIGH (ref <150)
Triglycerides: 4143 mg/dL — ABNORMAL HIGH (ref <150)

## 2022-02-08 LAB — CBC WITH DIFFERENTIAL/PLATELET
Abs Immature Granulocytes: 0.02 10*3/uL (ref 0.00–0.07)
Basophils Absolute: 0.1 10*3/uL (ref 0.0–0.1)
Basophils Relative: 1 %
Eosinophils Absolute: 0.1 10*3/uL (ref 0.0–0.5)
Eosinophils Relative: 2 %
HCT: 29.7 % — ABNORMAL LOW (ref 36.0–46.0)
Hemoglobin: 9.4 g/dL — ABNORMAL LOW (ref 12.0–15.0)
Immature Granulocytes: 0 %
Lymphocytes Relative: 31 %
Lymphs Abs: 1.8 10*3/uL (ref 0.7–4.0)
MCH: 28.7 pg (ref 26.0–34.0)
MCHC: 31.6 g/dL (ref 30.0–36.0)
MCV: 90.5 fL (ref 80.0–100.0)
Monocytes Absolute: 0.6 10*3/uL (ref 0.1–1.0)
Monocytes Relative: 10 %
Neutro Abs: 3.2 10*3/uL (ref 1.7–7.7)
Neutrophils Relative %: 56 %
Platelets: 129 10*3/uL — ABNORMAL LOW (ref 150–400)
RBC: 3.28 MIL/uL — ABNORMAL LOW (ref 3.87–5.11)
RDW: 15.9 % — ABNORMAL HIGH (ref 11.5–15.5)
WBC: 5.8 10*3/uL (ref 4.0–10.5)
nRBC: 0 % (ref 0.0–0.2)

## 2022-02-08 LAB — COMPREHENSIVE METABOLIC PANEL
ALT: 97 U/L — ABNORMAL HIGH (ref 0–44)
AST: 69 U/L — ABNORMAL HIGH (ref 15–41)
Albumin: 3.6 g/dL (ref 3.5–5.0)
Alkaline Phosphatase: 110 U/L (ref 38–126)
Anion gap: 16 — ABNORMAL HIGH (ref 5–15)
BUN: 39 mg/dL — ABNORMAL HIGH (ref 6–20)
CO2: 23 mmol/L (ref 22–32)
Calcium: 9.2 mg/dL (ref 8.9–10.3)
Chloride: 93 mmol/L — ABNORMAL LOW (ref 98–111)
Creatinine, Ser: 2.29 mg/dL — ABNORMAL HIGH (ref 0.44–1.00)
GFR, Estimated: 29 mL/min — ABNORMAL LOW (ref >60)
Glucose, Bld: 134 mg/dL — ABNORMAL HIGH (ref 70–99)
Potassium: 4.1 mmol/L (ref 3.5–5.1)
Sodium: 132 mmol/L — ABNORMAL LOW (ref 135–145)
Total Bilirubin: 0.4 mg/dL (ref 0.3–1.2)
Total Protein: 6.8 g/dL (ref 6.5–8.1)

## 2022-02-08 LAB — GLUCOSE, CAPILLARY
Glucose-Capillary: 122 mg/dL — ABNORMAL HIGH (ref 70–99)
Glucose-Capillary: 131 mg/dL — ABNORMAL HIGH (ref 70–99)
Glucose-Capillary: 144 mg/dL — ABNORMAL HIGH (ref 70–99)
Glucose-Capillary: 145 mg/dL — ABNORMAL HIGH (ref 70–99)
Glucose-Capillary: 150 mg/dL — ABNORMAL HIGH (ref 70–99)
Glucose-Capillary: 152 mg/dL — ABNORMAL HIGH (ref 70–99)
Glucose-Capillary: 163 mg/dL — ABNORMAL HIGH (ref 70–99)
Glucose-Capillary: 170 mg/dL — ABNORMAL HIGH (ref 70–99)
Glucose-Capillary: 188 mg/dL — ABNORMAL HIGH (ref 70–99)
Glucose-Capillary: 191 mg/dL — ABNORMAL HIGH (ref 70–99)

## 2022-02-08 LAB — BASIC METABOLIC PANEL
Anion gap: 18 — ABNORMAL HIGH (ref 5–15)
BUN: 36 mg/dL — ABNORMAL HIGH (ref 6–20)
CO2: 25 mmol/L (ref 22–32)
Calcium: 9.3 mg/dL (ref 8.9–10.3)
Chloride: 90 mmol/L — ABNORMAL LOW (ref 98–111)
Creatinine, Ser: 2.48 mg/dL — ABNORMAL HIGH (ref 0.44–1.00)
GFR, Estimated: 26 mL/min — ABNORMAL LOW (ref >60)
Glucose, Bld: 176 mg/dL — ABNORMAL HIGH (ref 70–99)
Potassium: 5 mmol/L (ref 3.5–5.1)
Sodium: 133 mmol/L — ABNORMAL LOW (ref 135–145)

## 2022-02-08 LAB — MAGNESIUM: Magnesium: 1.9 mg/dL (ref 1.7–2.4)

## 2022-02-08 LAB — LIPASE, BLOOD: Lipase: 24 U/L (ref 11–51)

## 2022-02-08 MED ORDER — SACUBITRIL-VALSARTAN 24-26 MG PO TABS
1.0000 | ORAL_TABLET | Freq: Two times a day (BID) | ORAL | Status: DC
  Administered 2022-02-08 – 2022-02-09 (×2): 1 via ORAL
  Filled 2022-02-08 (×3): qty 1

## 2022-02-08 NOTE — Consult Note (Signed)
Cardiology  Consult:   Patient ID: Jennifer [name]; MRN: [id number]; DOB: 1991/01/29   Admission date: 02/06/2022  Primary Care Provider: Sue Lush, PA-C Primary Cardiologist: Dr. Jeffie Pollock AHF  Chief Complaint:  Weight gain  Patient Profile:   Jennifer [name] is a 31 y.o. female with a history of HFrEF, Severe Triglyceridemia due to LDL deficiency, chronic pancreatitis and NAFLD, medically managed CAD and CKD stage IV who presents with volume overload.  History of Present Illness:   Jennifer [name] is feeling SOB.  [name] 10 lbs up from her dry weight (~ 146 lbs).  She was last seen in [facility] clinic 01/26/22. At that time had non cardiac CP. Had Elevation in her Cardiomems (goal of 17, she was at 26). She had DOE and abdominal bloating. She had recently seen Dr. Hulen Skains at [facility] and new that HD was going to be upcoming. Saw Paramedicine and had her correct medications. Ahd CP work up at that time.  Has variable BG. Cardomems improved to 20  02/03/22 had worsening bloating and took extra torsemide dose. 02/06/22 found to have TGS 5000 and BG 300.  Found to have worsening creatinine. Admitted for presumed pancreatitis. Her IVF has been stopped for BG control. Her abdominal pain has persistent. [name] presently having a [employer] sandwich.  I did not unwrap (there are notes in the past about diet non adherence). No CP. Marland Kitchen   Past Medical History:  Diagnosis Date   Brain tumor ([place]) 03/29/1995   astrocytoma   CHF (congestive heart failure) (HCC)    Cholesterosis    DM (diabetes mellitus) ([place]) 10/10/2018   Fatty liver    HTN (hypertension) 10/10/2018   Hypothyroidism 10/10/2018   Lipoprotein deficiency    Lung disease    longevity long term   Pancreatitis    Polycystic ovary syndrome     Past Surgical History:  Procedure Laterality Date   ABDOMINAL SURGERY     pt states during miscarriage got her intestine   BRAIN SURGERY     EYE MUSCLE SURGERY Right 03/28/2014    PRESSURE SENSOR/CARDIOMEMS N/A 12/06/2021   Procedure: PRESSURE SENSOR/CARDIOMEMS;  Surgeon: [name], MD;  Location: [place] [facility];  Service: Cardiovascular;  Laterality: N/A;   RIGHT HEART CATH N/A 08/05/2021   Procedure: RIGHT HEART CATH;  Surgeon: [name], MD;  Location: [place] [facility];  Service: Cardiovascular;  Laterality: N/A;   RIGHT HEART CATH N/A 12/06/2021   Procedure: RIGHT HEART CATH;  Surgeon: [name], MD;  Location: [place] [facility];  Service: Cardiovascular;  Laterality: N/A;   RIGHT/LEFT HEART CATH AND CORONARY ANGIOGRAPHY N/A 12/03/2020   Procedure: RIGHT/LEFT HEART CATH AND CORONARY ANGIOGRAPHY;  Surgeon: [name], MD;  Location: [place] [facility];  Service: Cardiovascular;  Laterality: N/A;   VENTRICULOSTOMY  03/28/1997     Medications Prior to Admission: Prior to Admission medications   Medication Sig Start Date End Date Taking? Authorizing Provider  albuterol (VENTOLIN HFA) 108 (90 Base) MCG/ACT inhaler Inhale 1-2 puffs into the lungs every 6 (six) hours as needed for wheezing or shortness of breath. 12/31/18  Yes [provider]  aspirin EC 81 MG EC tablet Take 1 tablet (81 mg total) by mouth daily. Swallow whole. 12/07/20  Yes Nicole Kindred A, DO  DULoxetine (CYMBALTA) 30 MG capsule Take 30 mg by mouth at bedtime. 01/28/19 01/27/23 Yes [provider]  Evolocumab (REPATHA) 140 MG/ML SOSY Inject 140 mg into the  skin See admin instructions. Inject 140 mg subcutaneously every other Sunday evening   Yes [provider]  fenofibrate 160 MG tablet Take 1 tablet (160 mg total) by mouth daily. 12/07/20  Yes Nicole Kindred A, DO  hydrALAZINE (APRESOLINE) 25 MG tablet Take 1 tablet (25 mg total) by mouth 3 (three) times daily. 12/15/21  Yes [name], Shaune Pascal, MD  insulin regular human CONCENTRATED (HUMULIN R) 500 UNIT/ML injection Inject 0.1-0.2 mLs (50-100 Units total) into the skin See admin instructions. 100  units with breakfast and lunch  50units at bedtime Patient taking differently: Inject 50-100 Units into the skin See admin instructions. 290 units with breakfast and lunch and dinner,  100units at bedtime 01/30/22  Yes Bero, Barth Kirks, MD  ivabradine (CORLANOR) 7.5 MG TABS tablet Take 1 tablet (7.5 mg total) by mouth 2 (two) times daily with a meal. 08/02/21  Yes [name], Shaune Pascal, MD  levothyroxine (SYNTHROID) 125 MCG tablet Take 1 tablet (125 mcg total) by mouth daily at 6 (six) AM. 12/07/20  Yes Nicole Kindred A, DO  loratadine (CLARITIN) 10 MG tablet Take 10 mg by mouth daily.   Yes [provider]  ondansetron (ZOFRAN) 4 MG tablet Take 4 mg by mouth every 8 (eight) hours as needed for nausea or vomiting. 12/19/18  Yes [provider]  pantoprazole (PROTONIX) 40 MG tablet Take 40 mg by mouth daily. 12/24/19  Yes [provider]  pregabalin (LYRICA) 150 MG capsule Take 1 capsule (150 mg total) by mouth at bedtime. Patient taking differently: Take 300 mg by mouth at bedtime. 12/12/21  Yes Domenic Polite, MD  prochlorperazine (COMPAZINE) 5 MG tablet Take 5 mg by mouth daily.   Yes [provider]  rosuvastatin (CRESTOR) 40 MG tablet Take 40 mg by mouth daily. 08/09/21  Yes [provider]  sildenafil (REVATIO) 20 MG tablet Take 1 tablet (20 mg total) by mouth 3 (three) times daily. 12/15/21  Yes [name], Shaune Pascal, MD  torsemide (DEMADEX) 20 MG tablet Take 4 tablets (80 mg total) by mouth daily. 12/21/21  Yes Clegg, [name] D, NP  tretinoin (RETIN-A) 0.05 % cream Apply 1 application topically at bedtime. 01/17/21  Yes [provider]  [name] 1/20 1-20 MG-MCG tablet Take 1 tablet by mouth daily. 11/01/21   [provider]     Allergies:    Allergies  Allergen Reactions   Ketamine Other (See Comments)    In vegetative state for 15 minutes per pt   Erythromycin    Fentanyl Nausea And Vomiting   Maitake Mushroom [Maitake] Itching    Itchy  throat   Penicillin V Itching    Gi upset   Shellfish Allergy Other (See Comments)    Pt has never had shellfish but tested positive on allergy test. Pt states contrast in CT is okay   Morphine Itching and Rash   Penicillins Rash    Has patient had a PCN reaction causing immediate rash, facial/tongue/throat swelling, SOB or lightheadedness with hypotension: Y Has patient had a PCN reaction causing severe rash involving mucus membranes or skin necrosis: Y Has patient had a PCN reaction that required hospitalization: N Has patient had a PCN reaction occurring within the last 10 years: Y If all of the above answers are "NO", then may proceed with Cephalosporin use.    Prednisone Rash    Social History:   Social History   Socioeconomic History   Marital status: Married    Spouse name: Not on  file   Number of children: 0   Years of education: Not on file   Highest education level: Not on file  Occupational History   Occupation: [employer]  Tobacco Use   Smoking status: Never   Smokeless tobacco: Never  Vaping Use   Vaping Use: Never used  Substance and Sexual Activity   Alcohol use: Never   Drug use: Never   Sexual activity: Yes  Other Topics Concern   Not on file  Social History Narrative   Not on file   Social Determinants of Health   Financial Resource Strain: Low Risk  (12/10/2021)   Overall Financial Resource Strain (CARDIA)    Difficulty of Paying Living Expenses: Not hard at all  Food Insecurity: No Food Insecurity (02/08/2022)   Hunger Vital Sign    Worried About Running Out of Food in the Last Year: Never true    [place] in the Last Year: Never true  Transportation Needs: No Transportation Needs (02/08/2022)   PRAPARE - Hydrologist (Medical): No    Lack of Transportation (Non-Medical): No  Physical Activity: Sufficiently Active (12/10/2021)   Exercise Vital Sign    Days of Exercise per Week: 3 days    Minutes of  Exercise per Session: 60 min  Stress: No Stress Concern Present (12/10/2021)   [place]    Feeling of Stress : Not at all  Social Connections: [place] (12/10/2021)   Social Connection and Isolation Panel [NHANES]    Frequency of Communication with Friends and Family: More than three times a week    Frequency of Social Gatherings with Friends and Family: More than three times a week    Attends Religious Services: More than 4 times per year    Active Member of [employer] or Organizations: No    Attends Music therapist: More than 4 times per year    Marital Status: Married  Human resources officer Violence: Not At Risk (02/08/2022)   Humiliation, Afraid, Rape, and Kick questionnaire    Fear of Current or Ex-Partner: No    Emotionally Abused: No    Physically Abused: No    Sexually Abused: No    Family History:   The patient's family history includes Diabetes in her father and mother; Hyperlipidemia in her mother; Hypertension in her father and mother; Pancreatic cancer in her paternal aunt and paternal uncle; Thyroid disease in her mother. There is no history of Colon cancer, Esophageal cancer, or Stomach cancer.    ROS:  Please see the history of present illness.   Physical Exam/Data:   Vitals:   02/08/22 0750 02/08/22 1100 02/08/22 1126 02/08/22 1200  BP: (!) 150/79  (!) 164/84   Pulse: (!) 101 (!) 103 (!) 103   Resp: (!) 26 19 (!) 25   Temp: 99.8 F (37.7 C)  99.5 F (37.5 C) 99.5 F (37.5 C)  TempSrc: Oral  Oral Oral  SpO2: 95% 100% 98%   Weight:      Height:        Intake/Output Summary (Last 24 hours) at 02/08/2022 1244 Last data filed at 02/08/2022 0927 Gross per 24 hour  Intake 1647.91 ml  Output 2000 ml  Net -352.09 ml   Filed Weights   02/06/22 1800 02/07/22 0620 02/08/22 0600  Weight: 71.8 kg 70.9 kg 71.9 kg   Body mass index is 34.3 kg/m.   Gen: no distress, chronically ill  appearing   Neck: elevated JVD HENT: Chronic R eye ptsosis Cardiac: No Rubs or Gallops,  Murmur, RRR +2 radial pulses Respiratory: Clear to auscultation bilaterally, normal effort, normal  respiratory rate GI: Soft, nontender, distended  MS: No  edema;  moves all extremities Integument: Hirsuit Neuro:  At time of evaluation, alert and oriented to person/place/time/situation  Psych: Normal affect, patient feels OK  EKG:  The ECG that was done  was personally reviewed and demonstrates SR Tele: SR  Relevant CV Studies: Cardiac Studies & Procedures   CARDIAC CATHETERIZATION  CARDIAC CATHETERIZATION 12/06/2021  Narrative Findings:  RA = 15 RV = 41/15 PA = 37/23 (28) PCW = 16 Fick cardiac output/index = 4.2/2.6 Thermo CO/CI = 3.7/2.3 PVR = 4.3 (TD) Ao sat = 94% PA sat = 51%, 54%  Assessment: 1. Mild PAH with primarily RV failure  Plan/Discussion:  Successful cardiomems implant. Start low-dose milrinone to optimize RV failure.  Glori Bickers, MD 8:47 AM   CARDIAC CATHETERIZATION  CARDIAC CATHETERIZATION 08/05/2021  Narrative Findings:  RA = 12 prominent V waves RV = 47/14 PA = 48/25 (37) PCW = 25 Fick cardiac output/index = 4.4/2.8 PVR =3.7 WU Ao sat = 99% PA sat = 62%, 62%  Assessment: 1. Elevated filling pressures with normal cardiac output 2. Mild to moderate mixed pulmonary HTN with prominent v-waves in RA suggestive of significant TR  Plan/Discussion:  Diurese.  Wil give dose of IV lasix prior to d/c. Add metolazone 2.5 + Kcl 40 once a week. Recheck echo to look at TV. Can consider cautious trial of selective pulmonary vasodilators once fluid status improved.  Glori Bickers, MD 10:17 AM     ECHOCARDIOGRAM  ECHOCARDIOGRAM COMPLETE 12/03/2021  Narrative ECHOCARDIOGRAM REPORT    Patient Name:   TYREA FROBERG Date of Exam: 12/03/2021 Medical Rec #:  [id number]         Height:       57.0 in Accession #:    [id number]        Weight:        147.0 lb Date of Birth:  03/14/1991         BSA:          1.578 m Patient Age:    31 years          BP:           120/65 mmHg Patient Gender: F                 HR:           99 bpm. Exam Location:  Inpatient  Procedure: 2D Echo, Cardiac Doppler and Color Doppler  Indications:    Chest pain  History:        Patient has prior history of Echocardiogram examinations, most recent 08/15/2021. Risk Factors:Diabetes and Hypertension. Non-ischemic cardiomyopathy. CKD.  Sonographer:    Clayton Lefort RDCS (AE) Referring Phys: [id number] RONDELL A SMITH  IMPRESSIONS   1. Low normal to mild global reduction in LV systolic function; EF 50. 2. Left ventricular ejection fraction, by estimation, is 50 to 55%. The left ventricle has low normal function. The left ventricle has no regional wall motion abnormalities. Left ventricular diastolic parameters were normal. 3. Right ventricular systolic function is normal. The right ventricular size is normal. There is mildly elevated pulmonary artery systolic pressure. 4. The mitral valve is normal in structure. Mild mitral valve regurgitation. No evidence of mitral stenosis. 5. Tricuspid valve regurgitation is mild to moderate. 6. The  aortic valve is tricuspid. Aortic valve regurgitation is mild. No aortic stenosis is present. 7. The inferior vena cava is normal in size with greater than 50% respiratory variability, suggesting right atrial pressure of 3 mmHg.  FINDINGS Left Ventricle: Left ventricular ejection fraction, by estimation, is 50 to 55%. The left ventricle has low normal function. The left ventricle has no regional wall motion abnormalities. The left ventricular internal cavity size was normal in size. There is no left ventricular hypertrophy. Left ventricular diastolic parameters were normal.  Right Ventricle: The right ventricular size is normal. Right ventricular systolic function is normal. There is mildly elevated pulmonary artery systolic pressure.  The tricuspid regurgitant velocity is 2.69 m/s, and with an assumed right atrial pressure of 8 mmHg, the estimated right ventricular systolic pressure is 82.9 mmHg.  Left Atrium: Left atrial size was normal in size.  Right Atrium: Right atrial size was normal in size.  Pericardium: There is no evidence of pericardial effusion.  Mitral Valve: The mitral valve is normal in structure. Mild mitral valve regurgitation. No evidence of mitral valve stenosis.  Tricuspid Valve: The tricuspid valve is normal in structure. Tricuspid valve regurgitation is mild to moderate. No evidence of tricuspid stenosis.  Aortic Valve: The aortic valve is tricuspid. Aortic valve regurgitation is mild. No aortic stenosis is present. Aortic valve mean gradient measures 4.0 mmHg. Aortic valve peak gradient measures 7.3 mmHg. Aortic valve area, by VTI measures 1.92 cm.  Pulmonic Valve: The pulmonic valve was normal in structure. Pulmonic valve regurgitation is trivial. No evidence of pulmonic stenosis.  Aorta: The aortic root is normal in size and structure.  Venous: The inferior vena cava is normal in size with greater than 50% respiratory variability, suggesting right atrial pressure of 3 mmHg.  IAS/Shunts: No atrial level shunt detected by color flow Doppler.  Additional Comments: Low normal to mild global reduction in LV systolic function; EF 50.   LEFT VENTRICLE PLAX 2D LVIDd:         4.50 cm LVIDs:         3.40 cm LV PW:         0.90 cm LV IVS:        1.00 cm LVOT diam:     1.90 cm LV SV:         42 LV SV Index:   27 LVOT Area:     2.84 cm   RIGHT VENTRICLE             IVC RV Basal diam:  2.70 cm     IVC diam: 1.60 cm RV S prime:     11.40 cm/s TAPSE (M-mode): 1.9 cm  LEFT ATRIUM             Index        RIGHT ATRIUM           Index LA diam:        2.40 cm 1.52 cm/m   RA Area:     17.00 cm LA Vol (A2C):   14.6 ml 9.25 ml/m   RA Volume:   49.10 ml  31.11 ml/m LA Vol (A4C):   28.5 ml 18.06  ml/m LA Biplane Vol: 21.3 ml 13.50 ml/m AORTIC VALVE AV Area (Vmax):    1.84 cm AV Area (Vmean):   1.89 cm AV Area (VTI):     1.92 cm AV Vmax:           135.00 cm/s AV Vmean:  88.500 cm/s AV VTI:            0.220 m AV Peak Grad:      7.3 mmHg AV Mean Grad:      4.0 mmHg LVOT Vmax:         87.80 cm/s LVOT Vmean:        59.100 cm/s LVOT VTI:          0.149 m LVOT/AV VTI ratio: 0.68  AORTA Ao Root diam: 2.50 cm Ao Asc diam:  2.80 cm  TRICUSPID VALVE TR Peak grad:   28.9 mmHg TR Vmax:        269.00 cm/s  SHUNTS Systemic VTI:  0.15 m Systemic Diam: 1.90 cm  Kirk Ruths MD Electronically signed by Kirk Ruths MD Signature Date/Time: 12/03/2021/12:44:00 PM    Final      CARDIAC MRI  MR CARDIAC MORPHOLOGY W WO CONTRAST 12/01/2020  Narrative CLINICAL DATA:  89F with chronic systolic heart failure p/w cardiogenic shock. Echo with EF 20-25% with moderate RV dysfunction  Cardiac MRI  08/04/2017 ([facility]):  1. The left ventricle is normal in size and has moderately decreased function EF 42% There is mid to basal inferoseptal hypokinesis. The right ventricle is normal in size and has mildly decreased function.  2. There is evidence of subepicardial enhancement of the mid inferoseptal wall segment, which is compatible with myocarditis. There is also a very small focal subendocardial enhancement in the mid inferoseptum which is compatible with infarction.  3. There is evidence of myocardial edema of the mid epicardial inferoseptum which is compatible with acute myocarditis.  4. Small circumferential pericardial effusion  EXAM: CARDIAC MRI  TECHNIQUE: The patient was scanned on a 1.5 Tesla Siemens magnet. A dedicated cardiac coil was used. Functional imaging was done using Fiesta sequences. 2,3, and 4 chamber views were done to assess for RWMA's. Modified Simpson's rule using a short axis stack was used to calculate an ejection fraction on a dedicated  work Conservation officer, nature. The patient received 6 cc of Gadavist. After 10 minutes inversion recovery sequences were used to assess for infiltration and scar tissue.  CONTRAST:  6 cc  of Gadavist  FINDINGS: Left ventricle:  -Mild dilatation  -Severe systolic dysfunction  -Mild nonspecific ECV elevation (28%)  -Normal global and regional T2 values  -Midwall LGE in basal to mid anterior/anterolateral walls  LV EF:  29% (Normal 56-78%)  Absolute volumes:  LV EDV: 138m (Normal 52-141 mL)  LV ESV: 1043m(Normal 13-51 mL)  LV SV: 4273mNormal 33-97 mL)  CO: 5.1L/min (Normal 2.7-6.0 L/min)  Indexed volumes:  LV EDV: 55m47m-m (Normal 41-81 mL/sq-m)  LV ESV: 70mL17mm (Normal 12-21 mL/sq-m)  LV SV: 28mL/4m (Normal 26-56 mL/sq-m)  CI: 3.4L/min/sq-m (Normal 1.8-3.8 L/min/sq-m)  Right ventricle: Normal size and systolic function  RV EF: 55% (Normal 47-80%)  Absolute volumes:  RV EDV: 89mL (23mal 58-154 mL)  RV ESV: 40mL (N71ml 12-68 mL)  RV SV: 49mL (No77m 35-98 mL)  CO: 5.9L/min (Normal 2.7-6 L/min)  Indexed volumes:  RV EDV: 60mL/sq-m23mrmal 48-87 mL/sq-m)  RV ESV: 27mL/sq-m 45mmal 11-28 mL/sq-m)  RV SV: 33mL/sq-m (62mal 27-57 mL/sq-m)  CI: 4.0L/min/sq-m (Normal 1.8-3.8 L/min/sq-m)  Left atrium: Normal size  Right atrium: Normal size  Mitral valve: Mild regurgitation  Aortic valve: Mild regurgitation  Tricuspid valve: Moderate regurgitation  Pulmonic valve: No regurgitation  Aorta: Normal proximal ascending aorta  Pericardium: Moderate effusion measuring up to 1cm adjacent to RV free wall  IMPRESSION: 1.  Midwall late gadolinium enhancement in basal to mid anterior/anterolateral walls. Differential diagnosis includes prior myocarditis (normal T2 values suggests no evidence of acute myocarditis) versus sarcoidosis. Would recommend cardiac PET to evaluate for sarcoid.  2.  Mild LV dilatation with severe systolic  dysfunction (EF 29%)  3.  Normal RV size and systolic function (EF 47%)  4. Moderate pericardial effusion measuring up to 1cm adjacent to RV free wall   Electronically Signed By: Oswaldo Milian M.D. On: 12/01/2020 23:21           Laboratory Data:  Chemistry Recent Labs  Lab 02/07/22 0529 02/08/22 0440  NA 135 132*  K 4.6 4.1  CL 97* 93*  CO2 18* 23  GLUCOSE 208* 134*  BUN 46* 39*  CREATININE 2.30* 2.29*  CALCIUM 9.3 9.2  GFRNONAA 29* 29*  ANIONGAP 20* 16*    Recent Labs  Lab 02/07/22 0529 02/08/22 0440  PROT 6.7 6.8  ALBUMIN 3.8 3.6  AST 115* 69*  ALT 118* 97*  ALKPHOS 130* 110  BILITOT 0.9 0.4   Hematology Recent Labs  Lab 02/07/22 0529 02/08/22 0440  WBC 6.4 5.8  RBC 3.32* 3.28*  HGB 10.2* 9.4*  HCT 30.4* 29.7*  MCV 91.6 90.5  MCH 30.7 28.7  MCHC 33.6 31.6  RDW 15.8* 15.9*  PLT 159 129*   Cardiac EnzymesNo results for input(s): "TROPONINI" in the last 168 hours. No results for input(s): "TROPIPOC" in the last 168 hours.  BNP Recent Labs  Lab 02/03/22 1641  BNP 39.4    DDimer No results for input(s): "DDIMER" in the last 168 hours.  Radiology/Studies:  No results found.  Assessment and Plan:   Acute on Chronic Systolic Heart Failure - NICM. - Echo (1/22): EF 35-40% Mod MR - [facility] 12/06/21: RA 15, PA 37/23 (28), PCWP 16, Fick CO/CI 4.2/2.6, Thermo CO/CI 3.7/2.3, PVR 4.3, PA sat 51%, 54%, PAPi 0.93 Cardiomems ~ 20  - will continue torsemide 80 mg as she has good output; may switch to IV bumex if not improving - BP elevated, Creatinine near baseline, I will restart Entresto 24-26 mg bid.  - tomorrow if BP still doing ok I may restart Coreg 3.125 mg bid; she appears chronically NYHA III and not in cardiogenic shock - Continue Corlanor 7.5 mg bid.  - Off Jardiance due to frequent GU infections.  - Off spiro w/ hyperkalemia  - BMP and Mg this PM   CKD IV - Baseline Scr ~2.5 - hopefully will avoid HD this admission    CAD -  Single vessel RCA occlusion (small co-dominant vessel) on cardiac cath 09/08. - Medical management.  - with non cardiac CP - Continue aspirin + statin.   Pulmonary hypertension with cor pulmonale - [facility] 12/06/21: RA 15, PA 37/23 (28), PCWP 16, Fick CO/CI 4.2/2.6, Thermo CO/CI 3.7/2.3, PVR 4.3, PA sat 51%, 54%, PAPi 0.93 - continue sildenafil   Severe hyperTG - Due to LPL deficiency w/ recent pancreatitis. - Continue fenofibrate  - she is followed by endocriology at [facility]   Type 3c DM - on insulin drip - BMP and mg this PM     For questions or updates, please contact [place] Please consult [email or web address] for contact info under Cardiology/STEMI.   Rudean [place], MD [facility] [address] [place], [place] [address] [phone]  12:44 PM

## 2022-02-08 NOTE — Progress Notes (Addendum)
PROGRESS NOTE  Jennifer [name] ZJQ:[id number] DOB: 1990-10-19 DOA: 02/06/2022 PCP: Sue Lush, PA-C  HPI/Recap of past 24 hours: 31 yo F w/ PMHx of HTN, DM2, GERD, HFrEF, hypothyroidism, HLD, CKD 4, presenting with abdominal pain, N/V. She has a complex history w/ recurrent pancreatitis that is typically d/t hypertriglyceridemia. W/u revealed elevated glucose (341) and elevated Scr (2.32), elevated AST/ALT, but normal lipase. Her CT ab/pelvis did not reveal pancreatitis. Lipid panel revealed >5000 TG. Pt was started on insulin gtt. Of note, at baseline she is on significant amounts of insulin U-500 (290units TID and 100units qHS). Also, she has HFrEF w/ a restrictive fluid regimen (~1L/day). Her endocrinologist is at [facility]. Patient admitted for further management.    Today, patient reported that it feels like her kidneys hurt, she was having some flank pain.  Reported feeling overloaded.  Triglycerides still elevated.     Assessment/Plan: Principal Problem:   Abdominal pain Active Problems:   Hypertriglyceridemia   Type 2 diabetes mellitus with hyperlipidemia (HCC)   Hypothyroidism   Chronic systolic CHF (congestive heart failure) (HCC)   Essential hypertension   CKD (chronic kidney disease) stage 4, GFR 15-29 ml/min (HCC)   Pulmonary hypertension (HCC)   NASH (nonalcoholic steatohepatitis)   Obesity (BMI 30-39.9)   Abdominal pain likely 2/2 ?pancreatitis Vs ?gastroparesis Normal lipase, given similarity to previous episodes, epigastric pian, elevated triglycerides CT abd/pelvis unremarkable USS abdomen showed hepatic steatosis, mild splenomegaly, otherwise unremarkable Correct hypertriglyceridemia as below Pain management, antiemetics   Hypertriglyceridemia Patient's endocrinology Dr Oval Linsey from [facility] agreed with the medical plan and treatment for presumed hypertriglyceridemia with insulin infusion. Recommended holding the Humulin RU-500, which typically needs to be  monitored and prescribed by an endocrinologist, and also should not be given while the patient is kept n.p.o. The patient could instead be restarted on Regular Insulin U100 when she is transitioned off of the insulin infusion back to a regular diet, while in the hospital." Changed Insulin infusion for triglyceride protocol specifically  Trend triglycerides, transition from insulin drip to Milnor insulin when TG <1000 if attainable Continue fibrate and Crestor Added regular diet, in order to continue IV insulin to drop TG Stopped D10 IV as patient is currently overloaded  Type 2 diabetes mellitus with hyperlipidemia (HCC) Hyperglycemic on admission, resolved with insulin drip See above re: endocrinology recommendations for U100 vs U500 Continue insulin drip for now Transition to U100 when trigs <1000  NASH (nonalcoholic steatohepatitis) Mildly elevated LFTs, AST/ALT LFTs baseline range in last six months 80s-120s Daily CMP  Acute on chronic systolic CHF (congestive heart failure) ([place]) Has Cardiomems, follows with Dr. Haroldine Laws S/p gentle IV fluids Cardiology consulted, appreciate recs Continue home torsemide Daily weights, strict I/Os Continue home ivabradine, hydralazine, sildenafil, Entresto  CKD (chronic kidney disease) stage 4, GFR 15-29 ml/min (HCC) Cr stable relative to baseline 2.2 Daily CMP  Normocytic anemia Hgb at baseline Daily CBC  Essential hypertension BP stable Continue hydralazine, torsemide  Pulmonary hypertension (HCC) Continue torsemide and sildenafil  Hypothyroidism Continue levothyroxine   Obesity (BMI 30-39.9) Lifestyle modification advised         Estimated body mass index is 34.3 kg/m as calculated from the following:   Height as of this encounter: 4' 9"  (1.448 m).   Weight as of this encounter: 71.9 kg.     Code Status: Full  Family Communication: Husband at bedside  Disposition Plan: Status is: Inpatient Remains inpatient appropriate  because: Level of care      Consultants:  Cardiology  Procedures: None  Antimicrobials: None  DVT prophylaxis: Lovenox   Objective: Vitals:   02/08/22 1100 02/08/22 1126 02/08/22 1200 02/08/22 1433  BP:  (!) 164/84  (!) 127/54  Pulse: (!) 103 (!) 103    Resp: 19 (!) 25    Temp:  99.5 F (37.5 C) 99.5 F (37.5 C)   TempSrc:  Oral Oral   SpO2: 100% 98%    Weight:      Height:        Intake/Output Summary (Last 24 hours) at 02/08/2022 1706 Last data filed at 02/08/2022 6063 Gross per 24 hour  Intake 1413.19 ml  Output 1500 ml  Net -86.81 ml   Filed Weights   02/06/22 1800 02/07/22 0620 02/08/22 0600  Weight: 71.8 kg 70.9 kg 71.9 kg    Exam: General: NAD, alert/awake/oriented, facial puffiness Cardiovascular: S1, S2 present Respiratory: Bilateral expiratory wheezing noted Abdomen: Soft, +tender across epigastric region, nondistended, bowel sounds present Musculoskeletal: No bilateral pedal edema noted Skin: Normal Psychiatry: Normal mood     Data Reviewed: CBC: Recent Labs  Lab 02/03/22 1641 02/05/22 2240 02/07/22 0529 02/08/22 0440  WBC 6.3 5.1 6.4 5.8  NEUTROABS  --   --   --  3.2  HGB 10.7* 11.4* 10.2* 9.4*  HCT 32.3* 32.1* 30.4* 29.7*  MCV 86.8 87.2 91.6 90.5  PLT 172 182 159 016*   Basic Metabolic Panel: Recent Labs  Lab 02/05/22 2310 02/06/22 0843 02/06/22 1905 02/07/22 0529 02/08/22 0440  NA 137 140 141 135 132*  K 4.4 3.5 4.4 4.6 4.1  CL 96* 100 101 97* 93*  CO2 24 21* 20* 18* 23  GLUCOSE 341* 81 95 208* 134*  BUN 51* 49* 47* 46* 39*  CREATININE 2.32* 2.19* 2.07* 2.30* 2.29*  CALCIUM 9.3 9.4 9.6 9.3 9.2  MG  --   --  2.0  --   --    GFR: Estimated Creatinine Clearance: 29.4 mL/min (A) (by C-G formula based on SCr of 2.29 mg/dL (H)). Liver Function Tests: Recent Labs  Lab 02/03/22 1641 02/05/22 2310 02/07/22 0529 02/08/22 0440  AST 107* 133* 115* 69*  ALT 119* 134* 118* 97*  ALKPHOS 141* 141* 130* 110  BILITOT 0.7  1.0 0.9 0.4  PROT 7.6 7.0 6.7 6.8  ALBUMIN 4.2 4.0 3.8 3.6   Recent Labs  Lab 02/05/22 2310 02/07/22 0529 02/08/22 0440  LIPASE 34 23 24   No results for input(s): "AMMONIA" in the last 168 hours. Coagulation Profile: No results for input(s): "INR", "PROTIME" in the last 168 hours. Cardiac Enzymes: No results for input(s): "CKTOTAL", "CKMB", "CKMBINDEX", "TROPONINI" in the last 168 hours. BNP (last 3 results) No results for input(s): "PROBNP" in the last 8760 hours. HbA1C: No results for input(s): "HGBA1C" in the last 72 hours. CBG: Recent Labs  Lab 02/08/22 0743 02/08/22 1012 02/08/22 1219 02/08/22 1414 02/08/22 1640  GLUCAP 131* 150* 144* 163* 191*   Lipid Profile: Recent Labs    02/06/22 0843 02/06/22 1905 02/07/22 1724 02/08/22 0440  CHOL 714*  --   --   --   HDL NOT REPORTED DUE TO HIGH TRIGLYCERIDES  --   --   --   LDLCALC UNABLE TO CALCULATE IF TRIGLYCERIDE OVER 400 mg/dL  --   --   --   TRIG >5,000*   < > >5,000* 4,580*  CHOLHDL NOT REPORTED DUE TO HIGH TRIGLYCERIDES  --   --   --   LDLDIRECT UNABLE TO CALCULATE IF TRIGLYCERIDE  IS >1293 mg/dL  --   --   --    < > = values in this interval not displayed.   Thyroid Function Tests: No results for input(s): "TSH", "T4TOTAL", "FREET4", "T3FREE", "THYROIDAB" in the last 72 hours. Anemia Panel: No results for input(s): "VITAMINB12", "FOLATE", "FERRITIN", "TIBC", "IRON", "RETICCTPCT" in the last 72 hours. Urine analysis:    Component Value Date/Time   COLORURINE YELLOW 02/05/2022 2240   APPEARANCEUR CLEAR 02/05/2022 2240   LABSPEC 1.013 02/05/2022 2240   PHURINE 5.0 02/05/2022 2240   GLUCOSEU 100 (A) 02/05/2022 2240   HGBUR NEGATIVE 02/05/2022 2240   BILIRUBINUR NEGATIVE 02/05/2022 2240   KETONESUR NEGATIVE 02/05/2022 2240   PROTEINUR TRACE (A) 02/05/2022 2240   NITRITE NEGATIVE 02/05/2022 2240   LEUKOCYTESUR NEGATIVE 02/05/2022 2240   Sepsis Labs: @LABRCNTIP (procalcitonin:4,lacticidven:4)  ) Recent  Results (from the past 240 hour(s))  Resp Panel by RT-PCR (Flu A&B, Covid) Anterior Nasal Swab     Status: None   Collection Time: 02/06/22  6:17 AM   Specimen: Anterior Nasal Swab  Result Value Ref Range Status   SARS Coronavirus 2 by RT PCR NEGATIVE NEGATIVE Final    Comment: (NOTE) SARS-CoV-2 target nucleic acids are NOT DETECTED.  The SARS-CoV-2 RNA is generally detectable in upper respiratory specimens during the acute phase of infection. The lowest concentration of SARS-CoV-2 viral copies this assay can detect is 138 copies/mL. A negative result does not preclude SARS-Cov-2 infection and should not be used as the sole basis for treatment or other patient management decisions. A negative result may occur with  improper specimen collection/handling, submission of specimen other than nasopharyngeal swab, presence of viral mutation(s) within the areas targeted by this assay, and inadequate number of viral copies(<138 copies/mL). A negative result must be combined with clinical observations, patient history, and epidemiological information. The expected result is Negative.  Fact Sheet for Patients:  EntrepreneurPulse.com.au  Fact Sheet for Healthcare Providers:  IncredibleEmployment.be  This test is no t yet approved or cleared by the [place] FDA and  has been authorized for detection and/or diagnosis of SARS-CoV-2 by FDA under an Emergency Use Authorization (EUA). This EUA will remain  in effect (meaning this test can be used) for the duration of the COVID-19 declaration under Section 564(b)(1) of the Act, 21 U.S.C.section 360bbb-3(b)(1), unless the authorization is terminated  or revoked sooner.       Influenza A by PCR NEGATIVE NEGATIVE Final   Influenza B by PCR NEGATIVE NEGATIVE Final    Comment: (NOTE) The Xpert Xpress SARS-CoV-2/FLU/RSV plus assay is intended as an aid in the diagnosis of influenza from Nasopharyngeal swab  specimens and should not be used as a sole basis for treatment. Nasal washings and aspirates are unacceptable for Xpert Xpress SARS-CoV-2/FLU/RSV testing.  Fact Sheet for Patients: EntrepreneurPulse.com.au  Fact Sheet for Healthcare Providers: IncredibleEmployment.be  This test is not yet approved or cleared by the [place] FDA and has been authorized for detection and/or diagnosis of SARS-CoV-2 by FDA under an Emergency Use Authorization (EUA). This EUA will remain in effect (meaning this test can be used) for the duration of the COVID-19 declaration under Section 564(b)(1) of the Act, 21 U.S.C. section 360bbb-3(b)(1), unless the authorization is terminated or revoked.  Performed at [facility], [address], [place], [place] [address]   MRSA Next Gen by PCR, Nasal     Status: None   Collection Time: 02/06/22  6:37 PM   Specimen: Nasal Mucosa; Nasal Swab  Result Value Ref Range Status   MRSA by PCR Next Gen NOT DETECTED NOT DETECTED Final    Comment: (NOTE) The GeneXpert MRSA Assay (FDA approved for NASAL specimens only), is one component of a comprehensive MRSA colonization surveillance program. It is not intended to diagnose MRSA infection nor to guide or monitor treatment for MRSA infections. Test performance is not FDA approved in patients less than 46 years old. Performed at [facility], [place] [address], [place], [place] [address]       Studies: DG CHEST PORT 1 VIEW  Result Date: 02/08/2022 CLINICAL DATA:  Chest pain short of breath EXAM: PORTABLE CHEST 1 VIEW COMPARISON:  Chest 02/06/2022 FINDINGS: Cardiac enlargement. Implanted device overlying the heart is unchanged in position. There is progression of vascular congestion and mild interstitial edema. No effusion. No focal infiltrate. IMPRESSION: Interval progression of vascular congestion and mild interstitial edema. Electronically  Signed   By: Franchot Gallo M.D.   On: 02/08/2022 16:22    Scheduled Meds:  aspirin EC  81 mg Oral Daily   Chlorhexidine Gluconate Cloth  6 each Topical Daily   DULoxetine  30 mg Oral QHS   enoxaparin (LOVENOX) injection  40 mg Subcutaneous Q24H   fenofibrate  160 mg Oral Daily   hydrALAZINE  25 mg Oral Q8H   ivabradine  7.5 mg Oral BID WC   levothyroxine  125 mcg Oral Q0600   pregabalin  300 mg Oral QHS   rosuvastatin  40 mg Oral Daily   sacubitril-valsartan  1 tablet Oral BID   sildenafil  20 mg Oral TID   torsemide  80 mg Oral Daily    Continuous Infusions:  insulin 0.15 Units/kg/hr (02/08/22 0927)     LOS: 2 days     Alma Friendly, MD [facility]  If 7PM-7AM, please contact night-coverage [email or web address] 02/08/2022, 5:06 PM

## 2022-02-08 NOTE — Progress Notes (Signed)
PT meds re-scheduled. PT needs to have food on her stomach to take one medication and the other one we are awaiting lab results.  Lab was contacted due to extended wait time and we were informed that because of the elevated triglycerides they are having to send the blood out to be tested at [facility]. VSS. Will inform night shift nurse of this change.

## 2022-02-09 ENCOUNTER — Inpatient Hospital Stay (HOSPITAL_COMMUNITY): Payer: BC Managed Care – PPO

## 2022-02-09 ENCOUNTER — Encounter (HOSPITAL_COMMUNITY)
Admission: EM | Disposition: A | Discharge status: Home / Self Care | Payer: Self-pay | Source: Home / Self Care | Attending: Internal Medicine

## 2022-02-09 DIAGNOSIS — R109 Unspecified abdominal pain: Secondary | ICD-10-CM | POA: Diagnosis not present

## 2022-02-09 LAB — APTT: aPTT: 32 seconds (ref 24–36)

## 2022-02-09 LAB — CBC WITH DIFFERENTIAL/PLATELET
Abs Immature Granulocytes: 0.03 10*3/uL (ref 0.00–0.07)
Basophils Absolute: 0 10*3/uL (ref 0.0–0.1)
Basophils Relative: 1 %
Eosinophils Absolute: 0.1 10*3/uL (ref 0.0–0.5)
Eosinophils Relative: 3 %
HCT: 27.8 % — ABNORMAL LOW (ref 36.0–46.0)
Hemoglobin: 9.3 g/dL — ABNORMAL LOW (ref 12.0–15.0)
Immature Granulocytes: 1 %
Lymphocytes Relative: 27 %
Lymphs Abs: 1.4 10*3/uL (ref 0.7–4.0)
MCH: 30.5 pg (ref 26.0–34.0)
MCHC: 33.5 g/dL (ref 30.0–36.0)
MCV: 91.1 fL (ref 80.0–100.0)
Monocytes Absolute: 0.5 10*3/uL (ref 0.1–1.0)
Monocytes Relative: 11 %
Neutro Abs: 3 10*3/uL (ref 1.7–7.7)
Neutrophils Relative %: 57 %
Platelets: 140 10*3/uL — ABNORMAL LOW (ref 150–400)
RBC: 3.05 MIL/uL — ABNORMAL LOW (ref 3.87–5.11)
RDW: 15.9 % — ABNORMAL HIGH (ref 11.5–15.5)
WBC: 5.1 10*3/uL (ref 4.0–10.5)
nRBC: 0 % (ref 0.0–0.2)

## 2022-02-09 LAB — GLUCOSE, CAPILLARY
Glucose-Capillary: 144 mg/dL — ABNORMAL HIGH (ref 70–99)
Glucose-Capillary: 159 mg/dL — ABNORMAL HIGH (ref 70–99)
Glucose-Capillary: 170 mg/dL — ABNORMAL HIGH (ref 70–99)
Glucose-Capillary: 187 mg/dL — ABNORMAL HIGH (ref 70–99)
Glucose-Capillary: 198 mg/dL — ABNORMAL HIGH (ref 70–99)
Glucose-Capillary: 200 mg/dL — ABNORMAL HIGH (ref 70–99)
Glucose-Capillary: 205 mg/dL — ABNORMAL HIGH (ref 70–99)
Glucose-Capillary: 210 mg/dL — ABNORMAL HIGH (ref 70–99)
Glucose-Capillary: 211 mg/dL — ABNORMAL HIGH (ref 70–99)
Glucose-Capillary: 218 mg/dL — ABNORMAL HIGH (ref 70–99)
Glucose-Capillary: 251 mg/dL — ABNORMAL HIGH (ref 70–99)

## 2022-02-09 LAB — COMPREHENSIVE METABOLIC PANEL
ALT: 96 U/L — ABNORMAL HIGH (ref 0–44)
AST: 99 U/L — ABNORMAL HIGH (ref 15–41)
Albumin: 3.5 g/dL (ref 3.5–5.0)
Alkaline Phosphatase: 118 U/L (ref 38–126)
Anion gap: 16 — ABNORMAL HIGH (ref 5–15)
BUN: 41 mg/dL — ABNORMAL HIGH (ref 6–20)
CO2: 25 mmol/L (ref 22–32)
Calcium: 9 mg/dL (ref 8.9–10.3)
Chloride: 93 mmol/L — ABNORMAL LOW (ref 98–111)
Creatinine, Ser: 2.55 mg/dL — ABNORMAL HIGH (ref 0.44–1.00)
GFR, Estimated: 25 mL/min — ABNORMAL LOW (ref >60)
Glucose, Bld: 194 mg/dL — ABNORMAL HIGH (ref 70–99)
Potassium: 4.4 mmol/L (ref 3.5–5.1)
Sodium: 134 mmol/L — ABNORMAL LOW (ref 135–145)
Total Bilirubin: 0.7 mg/dL (ref 0.3–1.2)
Total Protein: 6.4 g/dL — ABNORMAL LOW (ref 6.5–8.1)

## 2022-02-09 LAB — PROTIME-INR
INR: 1 (ref 0.8–1.2)
Prothrombin Time: 12.9 seconds (ref 11.4–15.2)

## 2022-02-09 LAB — HEPARIN LEVEL (UNFRACTIONATED): Heparin Unfractionated: <0.1 IU/mL — ABNORMAL LOW (ref 0.30–0.70)

## 2022-02-09 LAB — URINE CULTURE: Culture: NO GROWTH

## 2022-02-09 LAB — TRIGLYCERIDES
Triglycerides: 3761 mg/dL — ABNORMAL HIGH (ref <150)
Triglycerides: 3609 mg/dL — ABNORMAL HIGH (ref <150)

## 2022-02-09 LAB — LIPASE, BLOOD: Lipase: 23 U/L (ref 11–51)

## 2022-02-09 LAB — TROPONIN I (HIGH SENSITIVITY): Troponin I (High Sensitivity): 19 ng/L — ABNORMAL HIGH (ref <18)

## 2022-02-09 SURGERY — LEFT HEART CATH AND CORONARY ANGIOGRAPHY
Anesthesia: LOCAL

## 2022-02-09 MED ORDER — HEPARIN (PORCINE) 25000 UT/250ML-% IV SOLN
650.0000 [IU]/h | INTRAVENOUS | Status: DC
  Administered 2022-02-09: 650 [IU]/h via INTRAVENOUS
  Filled 2022-02-09: qty 250

## 2022-02-09 MED ORDER — NITROGLYCERIN 0.4 MG SL SUBL
SUBLINGUAL_TABLET | SUBLINGUAL | Status: AC
  Filled 2022-02-09: qty 1

## 2022-02-09 MED ORDER — HEPARIN BOLUS VIA INFUSION
3300.0000 [IU] | Freq: Once | INTRAVENOUS | Status: AC
  Administered 2022-02-09: 3300 [IU] via INTRAVENOUS
  Filled 2022-02-09: qty 3300

## 2022-02-09 MED ORDER — POLYVINYL ALCOHOL 1.4 % OP SOLN
1.0000 [drp] | OPHTHALMIC | Status: DC | PRN
  Administered 2022-02-09 – 2022-02-12 (×2): 1 [drp] via OPHTHALMIC
  Filled 2022-02-09 (×2): qty 15

## 2022-02-09 MED ORDER — ENOXAPARIN SODIUM 30 MG/0.3ML IJ SOSY
30.0000 mg | PREFILLED_SYRINGE | INTRAMUSCULAR | Status: DC
  Administered 2022-02-09 – 2022-02-12 (×4): 30 mg via SUBCUTANEOUS
  Filled 2022-02-09 (×4): qty 0.3

## 2022-02-09 MED ORDER — NITROGLYCERIN 0.4 MG SL SUBL: 0.4000 mg | SUBLINGUAL_TABLET | Freq: Once | SUBLINGUAL | Status: DC

## 2022-02-09 MED ORDER — ENOXAPARIN SODIUM 30 MG/0.3ML IJ SOSY: 30.0000 mg | PREFILLED_SYRINGE | INTRAMUSCULAR | Status: DC

## 2022-02-09 MED ORDER — SACUBITRIL-VALSARTAN 24-26 MG PO TABS
1.0000 | ORAL_TABLET | Freq: Two times a day (BID) | ORAL | Status: DC
  Administered 2022-02-09 – 2022-02-12 (×6): 1 via ORAL
  Filled 2022-02-09 (×9): qty 1

## 2022-02-09 MED ORDER — SODIUM CHLORIDE 0.9 % IV BOLUS: 500.0000 mL | Freq: Once | INTRAVENOUS | Status: DC

## 2022-02-09 MED ORDER — SODIUM CHLORIDE 0.9 % IV BOLUS
500.0000 mL | INTRAVENOUS | Status: AC
  Administered 2022-02-09: 500 mL via INTRAVENOUS

## 2022-02-09 MED ORDER — ASPIRIN 81 MG PO CHEW
243.0000 mg | CHEWABLE_TABLET | Freq: Once | ORAL | Status: AC
  Administered 2022-02-09: 243 mg via ORAL
  Filled 2022-02-09: qty 3

## 2022-02-09 MED ORDER — TORSEMIDE 20 MG PO TABS
60.0000 mg | ORAL_TABLET | Freq: Two times a day (BID) | ORAL | Status: DC
  Administered 2022-02-09 – 2022-02-10 (×2): 60 mg via ORAL
  Filled 2022-02-09 (×3): qty 3

## 2022-02-09 MED ORDER — HYDROMORPHONE HCL 1 MG/ML IJ SOLN
1.0000 mg | Freq: Once | INTRAMUSCULAR | Status: AC
  Administered 2022-02-09: 1 mg via INTRAVENOUS
  Filled 2022-02-09: qty 1

## 2022-02-09 MED ORDER — NITROGLYCERIN 0.4 MG SL SUBL
SUBLINGUAL_TABLET | SUBLINGUAL | Status: AC
  Administered 2022-02-09: 0.4 mg via SUBLINGUAL
  Filled 2022-02-09: qty 1

## 2022-02-09 NOTE — Progress Notes (Addendum)
Progress Note  Patient Name: Jennifer [name] Date of Encounter: 02/09/2022  Primary Cardiologist: Previously Dr. Einar Gip, more recently Advanced HF team  Subjective   Patient reports having intermittent back/flank pain overnight that would come and go, as well as chest pain that has progressively gotten worse since last night - worse than her prior chronic chest pain, described as sharp/pressurelike. EKG was done that showed subtle inferior STE elevation.   Inpatient Medications    Scheduled Meds:  aspirin EC  81 mg Oral Daily   Chlorhexidine Gluconate Cloth  6 each Topical Daily   DULoxetine  30 mg Oral QHS   enoxaparin (LOVENOX) injection  30 mg Subcutaneous Q24H   fenofibrate  160 mg Oral Daily   hydrALAZINE  25 mg Oral Q8H   ivabradine  7.5 mg Oral BID WC   levothyroxine  125 mcg Oral Q0600   pregabalin  300 mg Oral QHS   rosuvastatin  40 mg Oral Daily   sacubitril-valsartan  1 tablet Oral BID   sildenafil  20 mg Oral TID   torsemide  80 mg Oral Daily   Continuous Infusions:  insulin 0.15 Units/kg/hr (02/09/22 0400)   PRN Meds: albuterol, dextrose, HYDROmorphone (DILAUDID) injection, ipratropium-albuterol, ondansetron (ZOFRAN) IV, mouth rinse, oxyCODONE, polyvinyl alcohol, prochlorperazine   Vital Signs    Vitals:   02/09/22 0600 02/09/22 0629 02/09/22 0700 02/09/22 0800  BP:  (!) 111/53  125/65  Pulse: 97 (!) 103 96 98  Resp: 11 (!) 23 16 18   Temp:    99.8 F (37.7 C)  TempSrc:    Oral  SpO2: 97% 92% 90% 95%  Weight:      Height:        Intake/Output Summary (Last 24 hours) at 02/09/2022 1054 Last data filed at 02/09/2022 0643 Gross per 24 hour  Intake 197.22 ml  Output 2350 ml  Net -2152.78 ml      02/09/2022    5:00 AM 02/08/2022    6:00 AM 02/07/2022    6:20 AM  Last 3 Weights  Weight (lbs) 158 lb 11.7 oz 158 lb 8 oz 156 lb 3.2 oz  Weight (kg) 72 kg 71.895 kg 70.852 kg     Telemetry    NSR - Personally Reviewed  ECG   NSR 94bpm, subtle  ST elevation II, III, avF with subtle ST sagging I, avL. Similar appearance in III yesterday but II, avF seem elevated as well. These changes are new from 02/05/22  - Personally Reviewed  Physical Exam   GEN: No acute distress.  HEENT: Normocephalic, atraumatic, sclera non-icteric. Chronic R eye ptosis. Hirsuitism Neck: No JVD or bruits. Cardiac: RRR no murmurs, rubs, or gallops.  Respiratory: Clear to auscultation bilaterally. Breathing is unlabored. GI: Soft, nontender, non-distended, BS +x 4. MS: no deformity. Extremities: No clubbing or cyanosis. No significant LE edema. Distal pedal pulses are 2+ and equal bilaterally. Neuro:  AAOx3. Follows commands. Psych:  Responds to questions appropriately with a normal affect.  Labs    High Sensitivity Troponin:   Recent Labs  Lab 01/30/22 0121 02/03/22 1641 02/03/22 1900 02/05/22 2240 02/06/22 0843  TROPONINIHS 22* 30* 31* 24* 25*      Cardiac EnzymesNo results for input(s): "TROPONINI" in the last 168 hours. No results for input(s): "TROPIPOC" in the last 168 hours.   Chemistry Recent Labs  Lab 02/07/22 0529 02/08/22 0440 02/08/22 1700 02/09/22 0449  NA 135 132* 133* 134*  K 4.6 4.1 5.0 4.4  CL 97* 93* 90* 93*  CO2 18* 23 25 25   GLUCOSE 208* 134* 176* 194*  BUN 46* 39* 36* 41*  CREATININE 2.30* 2.29* 2.48* 2.55*  CALCIUM 9.3 9.2 9.3 9.0  PROT 6.7 6.8  --  6.4*  ALBUMIN 3.8 3.6  --  3.5  AST 115* 69*  --  99*  ALT 118* 97*  --  96*  ALKPHOS 130* 110  --  118  BILITOT 0.9 0.4  --  0.7  GFRNONAA 29* 29* 26* 25*  ANIONGAP 20* 16* 18* 16*     Hematology Recent Labs  Lab 02/07/22 0529 02/08/22 0440 02/09/22 0449  WBC 6.4 5.8 5.1  RBC 3.32* 3.28* 3.05*  HGB 10.2* 9.4* 9.3*  HCT 30.4* 29.7* 27.8*  MCV 91.6 90.5 91.1  MCH 30.7 28.7 30.5  MCHC 33.6 31.6 33.5  RDW 15.8* 15.9* 15.9*  PLT 159 129* 140*    BNP Recent Labs  Lab 02/03/22 1641  BNP 39.4     DDimer No results for input(s): "DDIMER" in the last  168 hours.   Radiology    DG CHEST PORT 1 VIEW  Result Date: 02/08/2022 CLINICAL DATA:  Chest pain short of breath EXAM: PORTABLE CHEST 1 VIEW COMPARISON:  Chest 02/06/2022 FINDINGS: Cardiac enlargement. Implanted device overlying the heart is unchanged in position. There is progression of vascular congestion and mild interstitial edema. No effusion. No focal infiltrate. IMPRESSION: Interval progression of vascular congestion and mild interstitial edema. Electronically Signed   By: Franchot Gallo M.D.   On: 02/08/2022 16:22    Cardiac Studies   2D echo 12/03/21     1. Low normal to mild global reduction in LV systolic function; EF 50.   2. Left ventricular ejection fraction, by estimation, is 50 to 55%. The  left ventricle has low normal function. The left ventricle has no regional  wall motion abnormalities. Left ventricular diastolic parameters were  normal.   3. Right ventricular systolic function is normal. The right ventricular  size is normal. There is mildly elevated pulmonary artery systolic  pressure.   4. The mitral valve is normal in structure. Mild mitral valve  regurgitation. No evidence of mitral stenosis.   5. Tricuspid valve regurgitation is mild to moderate.   6. The aortic valve is tricuspid. Aortic valve regurgitation is mild. No  aortic stenosis is present.   7. The inferior vena cava is normal in size with greater than 50%  respiratory variability, suggesting right atrial pressure of 3 mmHg.    Patient Profile     31 y.o. 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 Previously required furoscix, milrinone, cardiomems implantation, also discussion of starting HD soon. Admitted with worsening SOB, weight gain, abdominal pain. On 02/06/22 found to have trigs 5000 and CBG 300, AKI, admitted for presumed pancreatitis.  Assessment & Plan    1. Acute on chronic HFrEF, NICM, MR, TR, AI - Echo (1/22): EF 35-40% Mod MR - Echo (11/2021): EF 50-55%, normal RV, mildly elevated PASP, mild MR, mild-moderate TR, mild AI - RHC 12/06/21: RA 15, PA 37/23 (28), PCWP 16, Fick CO/CI 4.2/2.6, Thermo CO/CI 3.7/2.3, PVR 4.3, PA sat 51%, 54%, PAPi 0.93 - has  been receiving torsemide 54m daily - on hydralazine - on ivabradine - Entresto restarted 02/08/22 - no Jardiance due to frequent GU infections, no spiro w/ hyperkalemia - MD considering r/s of carvedilol but need to hold off pending reassessment of BP, will also tentatively hold torsemide and Entresto due to #2  2. CAD, elevated troponin - H/o single vessel RCA occlusion (small co-dominant vessel) on cardiac cath 11/2021, medically managed - Continue ASA, statin  - initial low/flat persistent troponin elevation not felt c/w MI. However, overnight patient developed progressive chest pain and EKG this morning concerning for inferior ST elevation. Code STEMI activated. Patient also on PDE5i so per d/w MD, given subsequent saline bolus of NS with plan to follow BP carefully following this. Avoid SL NTG going forward. Subsequent to this, Dr. CGasper Sellsdiscussed cath films with interventionalist Dr. HEllyn Hackas well as Dr. BHaroldine Lawswho felt EKG changes may reflect known RCA disease and code STEMI was cancelled.  Subsequent BP stable,129/62, 132/63, 145/76 therefore bolus stopped per MD  - further recs pending MD assessment  3. CKD stage IV - baseline Cr said to be 2.5 - Volume management complicated by progressive renal dysfunction. [facility] Discussing timing of HD start.   -  relatively stable today but need to follow with interventions above, we did discuss the possibility that if she undergoes cath, may very well definitively prompt the need for HD  4. Pulm HTN with cor pulmonale  - RHC 12/06/21: RA 15, PA 37/23 (28), PCWP 16, Fick CO/CI 4.2/2.6, Thermo CO/CI 3.7/2.3, PVR 4.3, PA sat 51%, 54%, PAPi 0.93 - on sildenadil PTA, receiving inpatient - hold for now due to SL NTG given - VQ negative 11/2021  5. Severe hypertriglyceridemia - lipase wnl - on fenofibrate - will await additional recs from MD  6. Anemia, thrombocytopenia - per IM  For questions or updates, please contact [place]ran[place]Please consult [email or web address] for contact info under Cardiology/STEMI.  Signed, DCharlie Pitter PA-C 02/09/2022, 10:54 AM    Personally seen and examined. Agree with APP above with the following comments: Called new chest pain. EKG Pattern III ST elevations. CODE STEMI Called. Heparin started. BP meds held SL nitro given X1 but with prior sildenafil given. STEMI reviewed with AHF and STEMI on call; give her CKD and her prior RCA disease, aborted. No hypotension.  Low dose IVF stopped CP is not suggestive of plaque rupture. Troponin < 100. Given back torsemide and GDMT medications  Plan is for GDMT and diuresis. Diet restarted Sildenafil for 02/12/22.  CRITICAL CARE Performed by: Handy Mcloud A Emmalia Heyboer  Total critical care time: 70 minutes. Critical care time was exclusive of separately billable procedures and treating other patients. Critical care was necessary to treat or prevent imminent or life-threatening deterioration. Critical care was time spent personally by me on the following activities: development of treatment plan with patient and/or surrogate as well as nursing, discussions with consultants, evaluation of patient's response to treatment, examination of patient, obtaining history from patient or surrogate, ordering and performing treatments and  interventions, ordering and review of laboratory studies, ordering and review of radiographic studies, pulse oximetry and re-evaluation of patient's condition.    Signed, MRudean Haskell MRock[place] 02/09/2022 3:06 PM

## 2022-02-09 NOTE — Progress Notes (Addendum)
ANTICOAGULATION CONSULT NOTE - Initial Consult  Pharmacy Consult for heparin Indication: chest pain/ACS  Allergies  Allergen Reactions   Ketamine Other (See Comments)    In vegetative state for 15 minutes per pt   Erythromycin    Fentanyl Nausea And Vomiting   Maitake Mushroom [[place]] Itching    Itchy throat   Penicillin V Itching    Gi upset   Shellfish Allergy Other (See Comments)    Pt has never had shellfish but tested positive on allergy test. Pt states contrast in CT is okay   Morphine Itching and Rash   Penicillins Rash    Has patient had a PCN reaction causing immediate rash, facial/tongue/throat swelling, SOB or lightheadedness with hypotension: Y Has patient had a PCN reaction causing severe rash involving mucus membranes or skin necrosis: Y Has patient had a PCN reaction that required hospitalization: N Has patient had a PCN reaction occurring within the last 10 years: Y If all of the above answers are "NO", then may proceed with Cephalosporin use.    Prednisone Rash    Patient Measurements: Height: 4' 9"  (144.8 cm) Weight: 72 kg (158 lb 11.7 oz) IBW/kg (Calculated) : 38.6 Heparin Dosing Weight: 55 kg  Vital Signs: Temp: 99.8 F (37.7 C) (11/15 0800) Temp Source: Oral (11/15 0800) BP: 125/65 (11/15 0800) Pulse Rate: 98 (11/15 0800)  Labs: Recent Labs    02/07/22 0529 02/08/22 0440 02/08/22 1700 02/09/22 0449  HGB 10.2* 9.4*  --  9.3*  HCT 30.4* 29.7*  --  27.8*  PLT 159 129*  --  140*  CREATININE 2.30* 2.29* 2.48* 2.55*    Estimated Creatinine Clearance: 26.5 mL/min (A) (by C-G formula based on SCr of 2.55 mg/dL (H)).   Medical History: Past Medical History:  Diagnosis Date   Brain tumor ([place]) 03/29/1995   astrocytoma   CHF (congestive heart failure) (HCC)    Cholesterosis    DM (diabetes mellitus) ([place]) 10/10/2018   Fatty liver    HTN (hypertension) 10/10/2018   Hypothyroidism 10/10/2018   Lipoprotein deficiency    Lung disease     longevity long term   Pancreatitis    Polycystic ovary syndrome       Assessment: 31 year old female here for hypertriglyceridemia on high dose insulin infusion. Patient with history of HFrEF. No anticoagulation noted PTA. Worsening chest pain 11/15 morning, stat EKG concerning for STEMI. Pharmacy consulted to manage heparin. Cardiology following - original plan for cardiac cath, but Code STEMI subsequently cancelled after review of films by Cardiology team.  Prophylactic Lovenox given 11/14 pm. Baseline labs ordered.  Goal of Therapy:  Heparin level 0.3-0.7 units/ml Monitor platelets by anticoagulation protocol: Yes   Plan:  -Heparin 3300 unit bolus -Start heparin infusion at 650 units/hr -Check 8 hr heparin level -CBC daily while on heparin infusion -Follow up plan/timing for cath  ADDENDUM: Code STEMI cancelled, troponin negative. Heparin discontinued and switched back to Lovenox for VTE prophylaxis per discussion with Cardiology.  Tawnya Crook, PharmD, BCPS Clinical Pharmacist 02/09/2022 11:30 AM

## 2022-02-09 NOTE — TOC Progression Note (Signed)
Transition of Care [facility]) - Progression Note    Patient Details  Name: Jennifer [name] MRN: [id number] Date of Birth: 08-19-1990  Transition of Care [facility]) CM/SW [place], RN Phone Number: 02/09/2022, 3:09 PM  Clinical Narrative:   TOC following for discharge needs.     Expected Discharge Plan: Home/Self Care Barriers to Discharge: Continued Medical Work up  Expected Discharge Plan and Services Expected Discharge Plan: Home/Self Care   Discharge Planning Services: CM Consult   Living arrangements for the past 2 months: Apartment                                       Social Determinants of Health (SDOH) Interventions    Readmission Risk Interventions    12/07/2021    4:36 PM  Readmission Risk Prevention Plan  Transportation Screening Complete  Medication Review ([place]) Complete  PCP or Specialist appointment within 3-5 days of discharge Complete  HRI or [place] Complete  SW Recovery Care/Counseling Consult Complete  [place] Not Applicable

## 2022-02-09 NOTE — Progress Notes (Signed)
Progress Note   Patient: Jennifer [name]:[id number] DOB: 1991/03/12 DOA: 02/06/2022     3 DOS: the patient was seen and examined on 02/09/2022   Brief hospital course: Jennifer [name] is a 31 y.o. F with hx astrocytoma s/p resection and chemo, hypertriglyceridemia and chronic pancreatitis, CKD IV baseline Cr 2.0, hypothyroidism, NICM and sCHF EF 40-45%, and DM who presented with 24 hours abdominal pain and vomiting.  In the ER, glucose >300, triglycerides >5000 (outpatient range appears to be between 150 and 1500).  Lipase normal and CT abdomen unremarkable.  Cr at baseline.  Admitted for presumed pancreatitis.  Assessment and Plan: Abdominal pain likely 2/2 ?pancreatitis Vs ?gastroparesis Normal lipase, given similarity to previous episodes, epigastric pian, elevated triglycerides CT abd/pelvis unremarkable USS abdomen showed hepatic steatosis, mild splenomegaly, otherwise unremarkable Correct hypertriglyceridemia as below Pain management, antiemetics Still with mild abd discomfort    Hypertriglyceridemia Patient's endocrinology Dr Oval Linsey from [facility] agreed with the medical plan and treatment for presumed hypertriglyceridemia with insulin infusion. Recommended holding the Humulin RU-500, which typically needs to be monitored and prescribed by an endocrinologist, and also should not be given while the patient is kept n.p.o. The patient could instead be restarted on Regular Insulin U100 when she is transitioned off of the insulin infusion back to a regular diet, while in the hospital." Changed Insulin infusion for triglyceride protocol specifically  Trend triglycerides, transition from insulin drip to Steele insulin when TG <1000 if attainable Continue fibrate and Crestor Now on regular diet, in order to continue IV insulin to drop TG   Type 2 diabetes mellitus with hyperlipidemia (HCC) Hyperglycemic on admission, resolved with insulin drip See above re: endocrinology recommendations  for U100 vs U500 Continue insulin drip for now Transition to U100 when trigs <1000   NASH (nonalcoholic steatohepatitis) Mildly elevated LFTs, AST/ALT LFTs baseline range in last six months 80s-120s Recheck cmp in AM   Acute on chronic systolic CHF (congestive heart failure) ([place]) Has Cardiomems, follows with Dr. Haroldine Laws S/p gentle IV fluids Cardiology consulted, appreciate recs Continue home torsemide Daily weights, strict I/Os Continue home ivabradine, hydralazine, sildenafil, Entresto   CKD (chronic kidney disease) stage 4, GFR 15-29 ml/min (HCC) Cr stable relative to baseline 2.2 Daily CMP   Normocytic anemia Hgb at baseline Daily CBC   Essential hypertension BP trends stable Continue hydralazine, torsemide   Pulmonary hypertension (HCC) Continue torsemide and sildenafil   Hypothyroidism Continue levothyroxine   Obesity (BMI 30-39.9) Lifestyle modification advised  Chest pain -New chest pains that started overnight -EKG reviewed, possible ST elevation in inferior leads -Initial plan was possible cath, however on Cardiology re-eval, was determined to cancel cath given CKD and prior RCA disease with trop <100 Per Cardiology, cont torsemide and GDMT meds      Subjective: Reports some chest pains this AM  Physical Exam: Vitals:   02/09/22 0629 02/09/22 0700 02/09/22 0800 02/09/22 1239  BP: (!) 111/53  125/65   Pulse: (!) 103 96 98   Resp: (!) 23 16 18    Temp:   99.8 F (37.7 C) 99 F (37.2 C)  TempSrc:   Oral Oral  SpO2: 92% 90% 95%   Weight:      Height:       General exam: Awake, laying in bed, in nad Respiratory system: Normal respiratory effort, no wheezing Cardiovascular system: regular rate, s1, s2 Gastrointestinal system: Soft, nondistended, positive BS Central nervous system: CN2-12 grossly intact, strength intact Extremities: Perfused, no clubbing Skin: Normal skin turgor,  no notable skin lesions seen Psychiatry: Mood normal // no visual  hallucinations   Data Reviewed:  Labs reviewed: Na 134, K 4.4, Cr 2.55, Hgb 9.3  Family Communication: Pt in room, family not at bedside  Disposition: Status is: Inpatient Remains inpatient appropriate because: severity of illness  Planned Discharge Destination: Home   Author: Marylu Lund, MD 02/09/2022 3:41 PM  For on call review [email or web address].

## 2022-02-10 DIAGNOSIS — I5020 Unspecified systolic (congestive) heart failure: Secondary | ICD-10-CM

## 2022-02-10 DIAGNOSIS — R109 Unspecified abdominal pain: Secondary | ICD-10-CM | POA: Diagnosis not present

## 2022-02-10 LAB — CBC WITH DIFFERENTIAL/PLATELET
Abs Immature Granulocytes: 0.04 10*3/uL (ref 0.00–0.07)
Basophils Absolute: 0.1 10*3/uL (ref 0.0–0.1)
Basophils Relative: 1 %
Eosinophils Absolute: 0.1 10*3/uL (ref 0.0–0.5)
Eosinophils Relative: 2 %
HCT: 30.9 % — ABNORMAL LOW (ref 36.0–46.0)
Hemoglobin: 9.9 g/dL — ABNORMAL LOW (ref 12.0–15.0)
Immature Granulocytes: 1 %
Lymphocytes Relative: 23 %
Lymphs Abs: 1.4 10*3/uL (ref 0.7–4.0)
MCH: 29.6 pg (ref 26.0–34.0)
MCHC: 32 g/dL (ref 30.0–36.0)
MCV: 92.2 fL (ref 80.0–100.0)
Monocytes Absolute: 0.6 10*3/uL (ref 0.1–1.0)
Monocytes Relative: 10 %
Neutro Abs: 3.7 10*3/uL (ref 1.7–7.7)
Neutrophils Relative %: 63 %
Platelets: 138 10*3/uL — ABNORMAL LOW (ref 150–400)
RBC: 3.35 MIL/uL — ABNORMAL LOW (ref 3.87–5.11)
RDW: 16 % — ABNORMAL HIGH (ref 11.5–15.5)
WBC: 5.9 10*3/uL (ref 4.0–10.5)
nRBC: 0 % (ref 0.0–0.2)

## 2022-02-10 LAB — GLUCOSE, CAPILLARY
Glucose-Capillary: 196 mg/dL — ABNORMAL HIGH (ref 70–99)
Glucose-Capillary: 226 mg/dL — ABNORMAL HIGH (ref 70–99)
Glucose-Capillary: 180 mg/dL — ABNORMAL HIGH (ref 70–99)
Glucose-Capillary: 154 mg/dL — ABNORMAL HIGH (ref 70–99)
Glucose-Capillary: 161 mg/dL — ABNORMAL HIGH (ref 70–99)
Glucose-Capillary: 122 mg/dL — ABNORMAL HIGH (ref 70–99)
Glucose-Capillary: 158 mg/dL — ABNORMAL HIGH (ref 70–99)
Glucose-Capillary: 197 mg/dL — ABNORMAL HIGH (ref 70–99)
Glucose-Capillary: 167 mg/dL — ABNORMAL HIGH (ref 70–99)
Glucose-Capillary: 264 mg/dL — ABNORMAL HIGH (ref 70–99)
Glucose-Capillary: 296 mg/dL — ABNORMAL HIGH (ref 70–99)
Glucose-Capillary: 277 mg/dL — ABNORMAL HIGH (ref 70–99)

## 2022-02-10 LAB — COMPREHENSIVE METABOLIC PANEL
ALT: 102 U/L — ABNORMAL HIGH (ref 0–44)
AST: 98 U/L — ABNORMAL HIGH (ref 15–41)
Albumin: 3.6 g/dL (ref 3.5–5.0)
Alkaline Phosphatase: 122 U/L (ref 38–126)
Anion gap: 16 — ABNORMAL HIGH (ref 5–15)
BUN: 43 mg/dL — ABNORMAL HIGH (ref 6–20)
CO2: 24 mmol/L (ref 22–32)
Calcium: 9.6 mg/dL (ref 8.9–10.3)
Chloride: 90 mmol/L — ABNORMAL LOW (ref 98–111)
Creatinine, Ser: 2.33 mg/dL — ABNORMAL HIGH (ref 0.44–1.00)
GFR, Estimated: 28 mL/min — ABNORMAL LOW (ref >60)
Glucose, Bld: 155 mg/dL — ABNORMAL HIGH (ref 70–99)
Potassium: 4.4 mmol/L (ref 3.5–5.1)
Sodium: 130 mmol/L — ABNORMAL LOW (ref 135–145)
Total Bilirubin: 0.5 mg/dL (ref 0.3–1.2)
Total Protein: 7.2 g/dL (ref 6.5–8.1)

## 2022-02-10 LAB — TRIGLYCERIDES
Triglycerides: 3250 mg/dL — ABNORMAL HIGH (ref <150)
Triglycerides: 2889 mg/dL — ABNORMAL HIGH (ref <150)

## 2022-02-10 LAB — LIPASE, BLOOD: Lipase: 21 U/L (ref 11–51)

## 2022-02-10 MED ORDER — DIPHENHYDRAMINE HCL 25 MG PO CAPS
25.0000 mg | ORAL_CAPSULE | Freq: Once | ORAL | Status: AC
  Administered 2022-02-10: 25 mg via ORAL
  Filled 2022-02-10: qty 1

## 2022-02-10 MED ORDER — TORSEMIDE 20 MG PO TABS
80.0000 mg | ORAL_TABLET | Freq: Two times a day (BID) | ORAL | Status: DC
  Administered 2022-02-10 – 2022-02-11 (×2): 80 mg via ORAL
  Filled 2022-02-10 (×2): qty 4

## 2022-02-10 MED ORDER — CARVEDILOL 3.125 MG PO TABS
3.1250 mg | ORAL_TABLET | Freq: Two times a day (BID) | ORAL | Status: DC
  Administered 2022-02-10 – 2022-02-13 (×5): 3.125 mg via ORAL
  Filled 2022-02-10 (×5): qty 1

## 2022-02-10 MED ORDER — DIPHENHYDRAMINE HCL 50 MG/ML IJ SOLN
25.0000 mg | Freq: Three times a day (TID) | INTRAMUSCULAR | Status: DC | PRN
  Administered 2022-02-10 – 2022-02-12 (×3): 25 mg via INTRAVENOUS
  Filled 2022-02-10 (×3): qty 1

## 2022-02-10 NOTE — Progress Notes (Signed)
Progress Note  Patient Name: Jennifer [name] Date of Encounter: 02/10/2022  Primary Cardiologist: Dr. Jeffie Pollock  Subjective   See 02/09/22 note for "new and different CP" and aborted STEMI activation  Overnight continue to diurese. Sleeping comfortable.  Would like me to come back later.  Inpatient Medications    Scheduled Meds:  aspirin EC  81 mg Oral Daily   Chlorhexidine Gluconate Cloth  6 each Topical Daily   DULoxetine  30 mg Oral QHS   enoxaparin (LOVENOX) injection  30 mg Subcutaneous Q24H   fenofibrate  160 mg Oral Daily   ivabradine  7.5 mg Oral BID WC   levothyroxine  125 mcg Oral Q0600   pregabalin  300 mg Oral QHS   rosuvastatin  40 mg Oral Daily   sacubitril-valsartan  1 tablet Oral BID   torsemide  60 mg Oral BID   Continuous Infusions:  insulin 0.15 Units/kg/hr (02/10/22 0700)   PRN Meds: albuterol, dextrose, HYDROmorphone (DILAUDID) injection, ipratropium-albuterol, ondansetron (ZOFRAN) IV, mouth rinse, oxyCODONE, polyvinyl alcohol, prochlorperazine   Vital Signs    Vitals:   02/10/22 1000 02/10/22 1100 02/10/22 1200 02/10/22 1300  BP:  (!) 140/67    Pulse: 91 85 84 89  Resp: 19 14 12 13   Temp:   98.6 F (37 C)   TempSrc:   Oral   SpO2: 97% 99% 92% 95%  Weight:      Height:        Intake/Output Summary (Last 24 hours) at 02/10/2022 1423 Last data filed at 02/10/2022 0700 Gross per 24 hour  Intake 187.72 ml  Output 2350 ml  Net -2162.28 ml   Filed Weights   02/08/22 0600 02/09/22 0500 02/10/22 0500  Weight: 71.9 kg 72 kg 71.2 kg    Telemetry    SR with rare PVCs - Personally Reviewed  Physical Exam   Gen: no distress, chronically ill appearing   Neck: elevated JVD HENT: Chronic R eye ptsosis Cardiac: No Rubs or Gallops, systolic murmur, RRR +2 radial pulses Respiratory: Clear to auscultation bilaterally, normal effort, normal respiratory rate GI: Soft, nontender, distended with fluid wave MS:  moves all  extremities Integument: Hirsuit  Labs    Chemistry Recent Labs  Lab 02/08/22 0440 02/08/22 1700 02/09/22 0449 02/10/22 0448  NA 132* 133* 134* 130*  K 4.1 5.0 4.4 4.4  CL 93* 90* 93* 90*  CO2 23 25 25 24   GLUCOSE 134* 176* 194* 155*  BUN 39* 36* 41* 43*  CREATININE 2.29* 2.48* 2.55* 2.33*  CALCIUM 9.2 9.3 9.0 9.6  PROT 6.8  --  6.4* 7.2  ALBUMIN 3.6  --  3.5 3.6  AST 69*  --  99* 98*  ALT 97*  --  96* 102*  ALKPHOS 110  --  118 122  BILITOT 0.4  --  0.7 0.5  GFRNONAA 29* 26* 25* 28*  ANIONGAP 16* 18* 16* 16*     Hematology Recent Labs  Lab 02/08/22 0440 02/09/22 0449 02/10/22 0448  WBC 5.8 5.1 5.9  RBC 3.28* 3.05* 3.35*  HGB 9.4* 9.3* 9.9*  HCT 29.7* 27.8* 30.9*  MCV 90.5 91.1 92.2  MCH 28.7 30.5 29.6  MCHC 31.6 33.5 32.0  RDW 15.9* 15.9* 16.0*  PLT 129* 140* 138*    Cardiac EnzymesNo results for input(s): "TROPONINI" in the last 168 hours. No results for input(s): "TROPIPOC" in the last 168 hours.   BNP Recent Labs  Lab 02/03/22 1641  BNP 39.4     DDimer  No results for input(s): "DDIMER" in the last 168 hours.   Radiology    DG CHEST PORT 1 VIEW  Result Date: 02/09/2022 CLINICAL DATA:  CHF EXAM: PORTABLE CHEST 1 VIEW COMPARISON:  CXR 02/08/22 FINDINGS: Unchanged positioning of the implanted cardiac device. Low lung volumes. No pleural effusion. No pneumothorax. Unchanged cardiac and mediastinal contours when compared to prior exam. There are mildly prominent interstitial opacities, which are nonspecific, and may represent sequela of bronchovascular crowding in the setting of low lung volumes and/or pulmonary venous congestion without overt pulmonary edema. No displaced rib fractures. Visualized upper abdomen is unremarkable. IMPRESSION: Pulmonary venous congestion without overt pulmonary edema. Electronically Signed   By: Marin Roberts M.D.   On: 02/09/2022 11:06   DG CHEST PORT 1 VIEW  Result Date: 02/08/2022 CLINICAL DATA:  Chest pain short of  breath EXAM: PORTABLE CHEST 1 VIEW COMPARISON:  Chest 02/06/2022 FINDINGS: Cardiac enlargement. Implanted device overlying the heart is unchanged in position. There is progression of vascular congestion and mild interstitial edema. No effusion. No focal infiltrate. IMPRESSION: Interval progression of vascular congestion and mild interstitial edema. Electronically Signed   By: Franchot Gallo M.D.   On: 02/08/2022 16:22    Cardiac Studies   Cardiac Studies & Procedures   CARDIAC CATHETERIZATION  CARDIAC CATHETERIZATION 12/06/2021  Narrative Findings:  RA = 15 RV = 41/15 PA = 37/23 (28) PCW = 16 Fick cardiac output/index = 4.2/2.6 Thermo CO/CI = 3.7/2.3 PVR = 4.3 (TD) Ao sat = 94% PA sat = 51%, 54%  Assessment: 1. Mild PAH with primarily RV failure  Plan/Discussion:  Successful cardiomems implant. Start low-dose milrinone to optimize RV failure.  Glori Bickers, MD 8:47 AM   CARDIAC CATHETERIZATION  CARDIAC CATHETERIZATION 08/05/2021  Narrative Findings:  RA = 12 prominent V waves RV = 47/14 PA = 48/25 (37) PCW = 25 Fick cardiac output/index = 4.4/2.8 PVR =3.7 WU Ao sat = 99% PA sat = 62%, 62%  Assessment: 1. Elevated filling pressures with normal cardiac output 2. Mild to moderate mixed pulmonary HTN with prominent v-waves in RA suggestive of significant TR  Plan/Discussion:  Diurese.  Wil give dose of IV lasix prior to d/c. Add metolazone 2.5 + Kcl 40 once a week. Recheck echo to look at TV. Can consider cautious trial of selective pulmonary vasodilators once fluid status improved.  Glori Bickers, MD 10:17 AM     ECHOCARDIOGRAM  ECHOCARDIOGRAM COMPLETE 12/03/2021  Narrative ECHOCARDIOGRAM REPORT    Patient Name:   Jennifer [name] Date of Exam: 12/03/2021 Medical Rec #:  [id number]         Height:       57.0 in Accession #:    [id number]        Weight:       147.0 lb Date of Birth:  Jan 10, 1991         BSA:          1.578 m Patient Age:    31  years          BP:           120/65 mmHg Patient Gender: F                 HR:           99 bpm. Exam Location:  Inpatient  Procedure: 2D Echo, Cardiac Doppler and Color Doppler  Indications:    Chest pain  History:        Patient has prior history  of Echocardiogram examinations, most recent 08/15/2021. Risk Factors:Diabetes and Hypertension. Non-ischemic cardiomyopathy. CKD.  Sonographer:    Clayton Lefort RDCS (AE) Referring Phys: [id number] RONDELL A SMITH  IMPRESSIONS   1. Low normal to mild global reduction in LV systolic function; EF 50. 2. Left ventricular ejection fraction, by estimation, is 50 to 55%. The left ventricle has low normal function. The left ventricle has no regional wall motion abnormalities. Left ventricular diastolic parameters were normal. 3. Right ventricular systolic function is normal. The right ventricular size is normal. There is mildly elevated pulmonary artery systolic pressure. 4. The mitral valve is normal in structure. Mild mitral valve regurgitation. No evidence of mitral stenosis. 5. Tricuspid valve regurgitation is mild to moderate. 6. The aortic valve is tricuspid. Aortic valve regurgitation is mild. No aortic stenosis is present. 7. The inferior vena cava is normal in size with greater than 50% respiratory variability, suggesting right atrial pressure of 3 mmHg.  FINDINGS Left Ventricle: Left ventricular ejection fraction, by estimation, is 50 to 55%. The left ventricle has low normal function. The left ventricle has no regional wall motion abnormalities. The left ventricular internal cavity size was normal in size. There is no left ventricular hypertrophy. Left ventricular diastolic parameters were normal.  Right Ventricle: The right ventricular size is normal. Right ventricular systolic function is normal. There is mildly elevated pulmonary artery systolic pressure. The tricuspid regurgitant velocity is 2.69 m/s, and with an assumed right atrial  pressure of 8 mmHg, the estimated right ventricular systolic pressure is 32.4 mmHg.  Left Atrium: Left atrial size was normal in size.  Right Atrium: Right atrial size was normal in size.  Pericardium: There is no evidence of pericardial effusion.  Mitral Valve: The mitral valve is normal in structure. Mild mitral valve regurgitation. No evidence of mitral valve stenosis.  Tricuspid Valve: The tricuspid valve is normal in structure. Tricuspid valve regurgitation is mild to moderate. No evidence of tricuspid stenosis.  Aortic Valve: The aortic valve is tricuspid. Aortic valve regurgitation is mild. No aortic stenosis is present. Aortic valve mean gradient measures 4.0 mmHg. Aortic valve peak gradient measures 7.3 mmHg. Aortic valve area, by VTI measures 1.92 cm.  Pulmonic Valve: The pulmonic valve was normal in structure. Pulmonic valve regurgitation is trivial. No evidence of pulmonic stenosis.  Aorta: The aortic root is normal in size and structure.  Venous: The inferior vena cava is normal in size with greater than 50% respiratory variability, suggesting right atrial pressure of 3 mmHg.  IAS/Shunts: No atrial level shunt detected by color flow Doppler.  Additional Comments: Low normal to mild global reduction in LV systolic function; EF 50.   LEFT VENTRICLE PLAX 2D LVIDd:         4.50 cm LVIDs:         3.40 cm LV PW:         0.90 cm LV IVS:        1.00 cm LVOT diam:     1.90 cm LV SV:         42 LV SV Index:   27 LVOT Area:     2.84 cm   RIGHT VENTRICLE             IVC RV Basal diam:  2.70 cm     IVC diam: 1.60 cm RV S prime:     11.40 cm/s TAPSE (M-mode): 1.9 cm  LEFT ATRIUM             Index  RIGHT ATRIUM           Index LA diam:        2.40 cm 1.52 cm/m   RA Area:     17.00 cm LA Vol (A2C):   14.6 ml 9.25 ml/m   RA Volume:   49.10 ml  31.11 ml/m LA Vol (A4C):   28.5 ml 18.06 ml/m LA Biplane Vol: 21.3 ml 13.50 ml/m AORTIC VALVE AV Area (Vmax):     1.84 cm AV Area (Vmean):   1.89 cm AV Area (VTI):     1.92 cm AV Vmax:           135.00 cm/s AV Vmean:          88.500 cm/s AV VTI:            0.220 m AV Peak Grad:      7.3 mmHg AV Mean Grad:      4.0 mmHg LVOT Vmax:         87.80 cm/s LVOT Vmean:        59.100 cm/s LVOT VTI:          0.149 m LVOT/AV VTI ratio: 0.68  AORTA Ao Root diam: 2.50 cm Ao Asc diam:  2.80 cm  TRICUSPID VALVE TR Peak grad:   28.9 mmHg TR Vmax:        269.00 cm/s  SHUNTS Systemic VTI:  0.15 m Systemic Diam: 1.90 cm  Kirk Ruths MD Electronically signed by Kirk Ruths MD Signature Date/Time: 12/03/2021/12:44:00 PM    Final      CARDIAC MRI  MR CARDIAC MORPHOLOGY W WO CONTRAST 12/01/2020  Narrative CLINICAL DATA:  68F with chronic systolic heart failure p/w cardiogenic shock. Echo with EF 20-25% with moderate RV dysfunction  Cardiac MRI  08/04/2017 ([facility]):  1. The left ventricle is normal in size and has moderately decreased function EF 42% There is mid to basal inferoseptal hypokinesis. The right ventricle is normal in size and has mildly decreased function.  2. There is evidence of subepicardial enhancement of the mid inferoseptal wall segment, which is compatible with myocarditis. There is also a very small focal subendocardial enhancement in the mid inferoseptum which is compatible with infarction.  3. There is evidence of myocardial edema of the mid epicardial inferoseptum which is compatible with acute myocarditis.  4. Small circumferential pericardial effusion  EXAM: CARDIAC MRI  TECHNIQUE: The patient was scanned on a 1.5 Tesla Siemens magnet. A dedicated cardiac coil was used. Functional imaging was done using Fiesta sequences. 2,3, and 4 chamber views were done to assess for RWMA's. Modified Simpson's rule using a short axis stack was used to calculate an ejection fraction on a dedicated work Conservation officer, nature. The patient received 6 cc of Gadavist.  After 10 minutes inversion recovery sequences were used to assess for infiltration and scar tissue.  CONTRAST:  6 cc  of Gadavist  FINDINGS: Left ventricle:  -Mild dilatation  -Severe systolic dysfunction  -Mild nonspecific ECV elevation (28%)  -Normal global and regional T2 values  -Midwall LGE in basal to mid anterior/anterolateral walls  LV EF:  29% (Normal 56-78%)  Absolute volumes:  LV EDV: 127m (Normal 52-141 mL)  LV ESV: 1063m(Normal 13-51 mL)  LV SV: 4233mNormal 33-97 mL)  CO: 5.1L/min (Normal 2.7-6.0 L/min)  Indexed volumes:  LV EDV: 76m42m-m (Normal 41-81 mL/sq-m)  LV ESV: 70mL55mm (Normal 12-21 mL/sq-m)  LV SV: 28mL/38m (Normal 26-56 mL/sq-m)  CI: 3.4L/min/sq-m (Normal 1.8-3.8 L/min/sq-m)  Right  ventricle: Normal size and systolic function  RV EF: 55% (Normal 47-80%)  Absolute volumes:  RV EDV: 78m (Normal 58-154 mL)  RV ESV: 428m(Normal 12-68 mL)  RV SV: 4974mNormal 35-98 mL)  CO: 5.9L/min (Normal 2.7-6 L/min)  Indexed volumes:  RV EDV: 24m24m-m (Normal 48-87 mL/sq-m)  RV ESV: 27mL41mm (Normal 11-28 mL/sq-m)  RV SV: 33mL/23m (Normal 27-57 mL/sq-m)  CI: 4.0L/min/sq-m (Normal 1.8-3.8 L/min/sq-m)  Left atrium: Normal size  Right atrium: Normal size  Mitral valve: Mild regurgitation  Aortic valve: Mild regurgitation  Tricuspid valve: Moderate regurgitation  Pulmonic valve: No regurgitation  Aorta: Normal proximal ascending aorta  Pericardium: Moderate effusion measuring up to 1cm adjacent to RV free wall  IMPRESSION: 1. Midwall late gadolinium enhancement in basal to mid anterior/anterolateral walls. Differential diagnosis includes prior myocarditis (normal T2 values suggests no evidence of acute myocarditis) versus sarcoidosis. Would recommend cardiac PET to evaluate for sarcoid.  2.  Mild LV dilatation with severe systolic dysfunction (EF 29%)  83% Normal RV size and systolic function (EF 55%)  15% Moderate pericardial effusion measuring up to 1cm adjacent to RV free wall   Electronically Signed By: ChristOswaldo MilianOn: 12/01/2020 23:21          Patient Profile     31 y.o71female with volume overload  Assessment & Plan    Acute on Chronic Systolic Heart Failure - NICM. - Echo (1/22): EF 35-40% Mod MR - [facility] 12/06/21: RA 15, PA 37/23 (28), PCWP 16, Fick CO/CI 4.2/2.6, Thermo CO/CI 3.7/2.3, PVR 4.3, PA sat 51%, 54%, PAPi 0.93 - increase to torseminde 80 mg PO BID; has not responded to lasix historically  Entresto 24-26 mg bid.  - Continue Corlanor 7.5 mg bid.  - Off Jardiance due to frequent GU infections.  - Off spiro w/ hyperkalemia     CAD - Single vessel RCA occlusion (small co-dominant vessel) on cardiac cath 09/08. - Medical management.  - Depending on lead placement; she has q waves or ST elevation in inferior leads; if "new and different CP" get troponin - Continue aspirin + statin.   CKD IV - Baseline Scr ~2.5 - hopefully will avoid HD this admission  Pulmonary hypertension with cor pulmonale - RHC 12/06/21: RA 15, PA 37/23 (28), PCWP 16, Fick CO/CI 4.2/2.6, Thermo CO/CI 3.7/2.3, PVR 4.3, PA sat 51%, 54%, PAPi 0.93 - sildenafil restart 02/11/22 (received nitro during aborted cath activiation)   Severe hyperTG - Due to LPL deficiency w/ recent pancreatitis. - Continue fenofibrate  - she is followed by endocriology at [facility]   Type 3c DM - per primary     For questions or updates, please contact [facility] Please consult [email or web address] for contact info under Cardiology/STEMI.      MaheshRudean HaskellASE FGakona GreensSan [phone] PM   ]

## 2022-02-10 NOTE — Progress Notes (Signed)
Progress Note   Patient: Jennifer [name] FIE:[id number] DOB: 07-28-1990 DOA: 02/06/2022     4 DOS: the patient was seen and examined on 02/10/2022   Brief hospital course: [name] is a 31 y.o. F with hx astrocytoma s/p resection and chemo, hypertriglyceridemia and chronic pancreatitis, CKD IV baseline Cr 2.0, hypothyroidism, NICM and sCHF EF 40-45%, and DM who presented with 24 hours abdominal pain and vomiting.  In the ER, glucose >300, triglycerides >5000 (outpatient range appears to be between 150 and 1500).  Lipase normal and CT abdomen unremarkable.  Cr at baseline.  Admitted for presumed pancreatitis.  Assessment and Plan: Abdominal pain likely 2/2 ?pancreatitis Vs ?gastroparesis Normal lipase, given similarity to previous episodes, epigastric pian, elevated triglycerides CT abd/pelvis unremarkable USS abdomen showed hepatic steatosis, mild splenomegaly, otherwise unremarkable Correct hypertriglyceridemia as below Pain management, antiemetics Still with mild abd discomfort    Hypertriglyceridemia Patient's endocrinology Dr Oval Linsey from [facility] agreed with the medical plan and treatment for presumed hypertriglyceridemia with insulin infusion. Recommended holding the Humulin RU-500, which typically needs to be monitored and prescribed by an endocrinologist, and also should not be given while the patient is kept n.p.o. The patient could instead be restarted on Regular Insulin U100 when she is transitioned off of the insulin infusion back to a regular diet, while in the hospital." Changed Insulin infusion for triglyceride protocol specifically  Trend triglycerides, transition from insulin drip to [place] insulin when TG <1000 if attainable Continue fibrate and Crestor TG down to 3250 today Now on regular diet, in order to continue IV insulin to drop TG   Type 2 diabetes mellitus with hyperlipidemia (HCC) Hyperglycemic on admission, resolved with insulin drip See above re:  endocrinology recommendations for U100 vs U500 Continue insulin drip for now Transition to U100 when trigs <1000   NASH (nonalcoholic steatohepatitis) Mildly elevated LFTs, AST/ALT LFTs baseline range in last six months 80s-120s Recheck cmp in AM   Acute on chronic systolic CHF (congestive heart failure) ([place]) Has Cardiomems, follows with Dr. Haroldine Laws S/p gentle IV fluids Cardiology consulted, appreciate recs Daily weights, strict I/Os Continue home ivabradine, hydralazine, sildenafil, Entresto Torsemide increased to 48m per Cardiology. Pt historically did not tolerate lasix   CKD (chronic kidney disease) stage 4, GFR 15-29 ml/min (HCC) Cr stable relative to baseline 2.2 Daily CMP   Normocytic anemia Hgb at baseline Daily CBC   Essential hypertension BP trends stable Continue hydralazine, torsemide   Pulmonary hypertension (HCC) Continue torsemide and sildenafil   Hypothyroidism Continue levothyroxine   Obesity (BMI 30-39.9) Lifestyle modification advised  Chest pain -New chest pains recently -EKG reviewed, possible ST elevation in inferior leads -Initial plan was possible cath, however on Cardiology re-eval, was determined to cancel cath given CKD and prior RCA disease with trop <100 Per Cardiology, cont torsemide and GDMT meds as per above      Subjective: continues with some chest pains, primarily on the R side  Physical Exam: Vitals:   02/10/22 1000 02/10/22 1100 02/10/22 1200 02/10/22 1300  BP:  (!) 140/67    Pulse: 91 85 84 89  Resp: 19 14 12 13   Temp:   98.6 F (37 C)   TempSrc:   Oral   SpO2: 97% 99% 92% 95%  Weight:      Height:       General exam: Conversant, in no acute distress Respiratory system: normal chest rise, clear, no audible wheezing Cardiovascular system: regular rhythm, s1-s2 Gastrointestinal system: Nondistended, nontender, pos BS Central nervous  system: No seizures, no tremors Extremities: No cyanosis, no joint  deformities Skin: No rashes, no pallor Psychiatry: Affect normal // no auditory hallucinations   Data Reviewed:  Labs reviewed: Na 130, K 4.4, Cr 2.33, Hgb 9.9  Family Communication: Pt in room, family not at bedside  Disposition: Status is: Inpatient Remains inpatient appropriate because: severity of illness  Planned Discharge Destination: Home   Author: Marylu Lund, MD 02/10/2022 4:05 PM  For on call review [email or web address].

## 2022-02-11 DIAGNOSIS — R109 Unspecified abdominal pain: Secondary | ICD-10-CM | POA: Diagnosis not present

## 2022-02-11 LAB — COMPREHENSIVE METABOLIC PANEL
ALT: 73 U/L — ABNORMAL HIGH (ref 0–44)
AST: 55 U/L — ABNORMAL HIGH (ref 15–41)
Albumin: 3.3 g/dL — ABNORMAL LOW (ref 3.5–5.0)
Alkaline Phosphatase: 102 U/L (ref 38–126)
Anion gap: 13 (ref 5–15)
BUN: 56 mg/dL — ABNORMAL HIGH (ref 6–20)
CO2: 28 mmol/L (ref 22–32)
Calcium: 9.5 mg/dL (ref 8.9–10.3)
Chloride: 93 mmol/L — ABNORMAL LOW (ref 98–111)
Creatinine, Ser: 2.73 mg/dL — ABNORMAL HIGH (ref 0.44–1.00)
GFR, Estimated: 23 mL/min — ABNORMAL LOW (ref >60)
Glucose, Bld: 166 mg/dL — ABNORMAL HIGH (ref 70–99)
Potassium: 4.1 mmol/L (ref 3.5–5.1)
Sodium: 134 mmol/L — ABNORMAL LOW (ref 135–145)
Total Bilirubin: 0.7 mg/dL (ref 0.3–1.2)
Total Protein: 6.8 g/dL (ref 6.5–8.1)

## 2022-02-11 LAB — CBC WITH DIFFERENTIAL/PLATELET
Abs Immature Granulocytes: 0.04 10*3/uL (ref 0.00–0.07)
Basophils Absolute: 0 10*3/uL (ref 0.0–0.1)
Basophils Relative: 1 %
Eosinophils Absolute: 0.2 10*3/uL (ref 0.0–0.5)
Eosinophils Relative: 3 %
HCT: 29.7 % — ABNORMAL LOW (ref 36.0–46.0)
Hemoglobin: 9.3 g/dL — ABNORMAL LOW (ref 12.0–15.0)
Immature Granulocytes: 1 %
Lymphocytes Relative: 28 %
Lymphs Abs: 1.5 10*3/uL (ref 0.7–4.0)
MCH: 28.6 pg (ref 26.0–34.0)
MCHC: 31.3 g/dL (ref 30.0–36.0)
MCV: 91.4 fL (ref 80.0–100.0)
Monocytes Absolute: 0.6 10*3/uL (ref 0.1–1.0)
Monocytes Relative: 12 %
Neutro Abs: 2.9 10*3/uL (ref 1.7–7.7)
Neutrophils Relative %: 55 %
Platelets: 127 10*3/uL — ABNORMAL LOW (ref 150–400)
RBC: 3.25 MIL/uL — ABNORMAL LOW (ref 3.87–5.11)
RDW: 15.9 % — ABNORMAL HIGH (ref 11.5–15.5)
WBC: 5.3 10*3/uL (ref 4.0–10.5)
nRBC: 0 % (ref 0.0–0.2)

## 2022-02-11 LAB — GLUCOSE, CAPILLARY
Glucose-Capillary: 118 mg/dL — ABNORMAL HIGH (ref 70–99)
Glucose-Capillary: 164 mg/dL — ABNORMAL HIGH (ref 70–99)
Glucose-Capillary: 167 mg/dL — ABNORMAL HIGH (ref 70–99)
Glucose-Capillary: 182 mg/dL — ABNORMAL HIGH (ref 70–99)
Glucose-Capillary: 198 mg/dL — ABNORMAL HIGH (ref 70–99)
Glucose-Capillary: 222 mg/dL — ABNORMAL HIGH (ref 70–99)
Glucose-Capillary: 230 mg/dL — ABNORMAL HIGH (ref 70–99)
Glucose-Capillary: 239 mg/dL — ABNORMAL HIGH (ref 70–99)
Glucose-Capillary: 292 mg/dL — ABNORMAL HIGH (ref 70–99)
Glucose-Capillary: 305 mg/dL — ABNORMAL HIGH (ref 70–99)
Glucose-Capillary: 321 mg/dL — ABNORMAL HIGH (ref 70–99)

## 2022-02-11 LAB — TRIGLYCERIDES
Triglycerides: 2054 mg/dL — ABNORMAL HIGH (ref <150)
Triglycerides: 2180 mg/dL — ABNORMAL HIGH (ref <150)

## 2022-02-11 LAB — LIPASE, BLOOD: Lipase: 25 U/L (ref 11–51)

## 2022-02-11 MED ORDER — SILDENAFIL CITRATE 20 MG PO TABS
20.0000 mg | ORAL_TABLET | Freq: Three times a day (TID) | ORAL | Status: DC
  Administered 2022-02-11 – 2022-02-13 (×5): 20 mg via ORAL
  Filled 2022-02-11 (×10): qty 1

## 2022-02-11 MED ORDER — TORSEMIDE 20 MG PO TABS
80.0000 mg | ORAL_TABLET | Freq: Every day | ORAL | Status: DC
  Filled 2022-02-11: qty 4

## 2022-02-11 NOTE — Progress Notes (Addendum)
Progress Note  Patient Name: Jennifer [name] Date of Encounter: 02/11/2022  Primary Cardiologist: Dr. Jeffie Pollock  Subjective   Says home stable wt is about 146, but feels her breathing is at baseline now, even though wt is 156.97 Husband helps at home, no problem Says she has a routine at home to monitor her weight, will keep a close eye on it.  Really feels well enough to go home  Inpatient Medications    Scheduled Meds:  aspirin EC  81 mg Oral Daily   carvedilol  3.125 mg Oral BID WC   Chlorhexidine Gluconate Cloth  6 each Topical Daily   DULoxetine  30 mg Oral QHS   enoxaparin (LOVENOX) injection  30 mg Subcutaneous Q24H   fenofibrate  160 mg Oral Daily   ivabradine  7.5 mg Oral BID WC   levothyroxine  125 mcg Oral Q0600   pregabalin  300 mg Oral QHS   rosuvastatin  40 mg Oral Daily   sacubitril-valsartan  1 tablet Oral BID   torsemide  80 mg Oral BID   Continuous Infusions:  insulin 0.15 Units/kg/hr (02/11/22 0342)   PRN Meds: albuterol, dextrose, diphenhydrAMINE, HYDROmorphone (DILAUDID) injection, ipratropium-albuterol, ondansetron (ZOFRAN) IV, mouth rinse, oxyCODONE, polyvinyl alcohol, prochlorperazine   Vital Signs    Vitals:   02/10/22 2320 02/10/22 2323 02/11/22 0344 02/11/22 0345  BP:   (!) 114/52   Pulse: 71 78  74  Resp: 16   11  Temp:  97.8 F (36.6 C) 97.6 F (36.4 C)   TempSrc:  Oral Axillary   SpO2: 92% 100%  100%  Weight:      Height:        Intake/Output Summary (Last 24 hours) at 02/11/2022 0759 Last data filed at 02/11/2022 0600 Gross per 24 hour  Intake 282.58 ml  Output 1425 ml  Net -1142.42 ml   Filed Weights   02/08/22 0600 02/09/22 0500 02/10/22 0500  Weight: 71.9 kg 72 kg 71.2 kg    Telemetry    SR, PVCs - Personally Reviewed  Physical Exam   Gen: no distress, chronically ill appearing   Neck: JVD not seen clearly, mildly elevated HENT: Chronic R eye ptsosis Cardiac: No Rubs or Gallops, systolic murmur, RRR +2  radial pulses Respiratory: Clear to auscultation bilaterally, normal effort, normal respiratory rate GI: Soft, nontender, distended,  no clear fluid wave MS:  moves all extremities Integument: Hirsuit  Labs    Chemistry Recent Labs  Lab 02/09/22 0449 02/10/22 0448 02/11/22 0436  NA 134* 130* 134*  K 4.4 4.4 4.1  CL 93* 90* 93*  CO2 25 24 28   GLUCOSE 194* 155* 166*  BUN 41* 43* 56*  CREATININE 2.55* 2.33* 2.73*  CALCIUM 9.0 9.6 9.5  PROT 6.4* 7.2 6.8  ALBUMIN 3.5 3.6 3.3*  AST 99* 98* 55*  ALT 96* 102* 73*  ALKPHOS 118 122 102  BILITOT 0.7 0.5 0.7  GFRNONAA 25* 28* 23*  ANIONGAP 16* 16* 13     Hematology Recent Labs  Lab 02/09/22 0449 02/10/22 0448 02/11/22 0436  WBC 5.1 5.9 5.3  RBC 3.05* 3.35* 3.25*  HGB 9.3* 9.9* 9.3*  HCT 27.8* 30.9* 29.7*  MCV 91.1 92.2 91.4  MCH 30.5 29.6 28.6  MCHC 33.5 32.0 31.3  RDW 15.9* 16.0* 15.9*  PLT 140* 138* 127*    Cardiac EnzymesNo results for input(s): "TROPONINI" in the last 168 hours. No results for input(s): "TROPIPOC" in the last 168 hours.   BNP No results  for input(s): "BNP", "PROBNP" in the last 168 hours.    DDimer No results for input(s): "DDIMER" in the last 168 hours.   Radiology    DG CHEST PORT 1 VIEW  Result Date: 02/09/2022 CLINICAL DATA:  CHF EXAM: PORTABLE CHEST 1 VIEW COMPARISON:  CXR 02/08/22 FINDINGS: Unchanged positioning of the implanted cardiac device. Low lung volumes. No pleural effusion. No pneumothorax. Unchanged cardiac and mediastinal contours when compared to prior exam. There are mildly prominent interstitial opacities, which are nonspecific, and may represent sequela of bronchovascular crowding in the setting of low lung volumes and/or pulmonary venous congestion without overt pulmonary edema. No displaced rib fractures. Visualized upper abdomen is unremarkable. IMPRESSION: Pulmonary venous congestion without overt pulmonary edema. Electronically Signed   By: Marin Roberts M.D.   On:  02/09/2022 11:06    Cardiac Studies   Cardiac Studies & Procedures   CARDIAC CATHETERIZATION  CARDIAC CATHETERIZATION 12/06/2021  Narrative Findings:  RA = 15 RV = 41/15 PA = 37/23 (28) PCW = 16 Fick cardiac output/index = 4.2/2.6 Thermo CO/CI = 3.7/2.3 PVR = 4.3 (TD) Ao sat = 94% PA sat = 51%, 54%  Assessment: 1. Mild PAH with primarily RV failure  Plan/Discussion:  Successful cardiomems implant. Start low-dose milrinone to optimize RV failure.  Glori Bickers, MD 8:47 AM   CARDIAC CATHETERIZATION  CARDIAC CATHETERIZATION 08/05/2021  Narrative Findings:  RA = 12 prominent V waves RV = 47/14 PA = 48/25 (37) PCW = 25 Fick cardiac output/index = 4.4/2.8 PVR =3.7 WU Ao sat = 99% PA sat = 62%, 62%  Assessment: 1. Elevated filling pressures with normal cardiac output 2. Mild to moderate mixed pulmonary HTN with prominent v-waves in RA suggestive of significant TR  Plan/Discussion:  Diurese.  Wil give dose of IV lasix prior to d/c. Add metolazone 2.5 + Kcl 40 once a week. Recheck echo to look at TV. Can consider cautious trial of selective pulmonary vasodilators once fluid status improved.  Glori Bickers, MD 10:17 AM     ECHOCARDIOGRAM  ECHOCARDIOGRAM COMPLETE 12/03/2021  Narrative ECHOCARDIOGRAM REPORT    Patient Name:   GLADA WICKSTROM Date of Exam: 12/03/2021 Medical Rec #:  [id number]         Height:       57.0 in Accession #:    [id number]        Weight:       147.0 lb Date of Birth:  12/23/1990         BSA:          1.578 m Patient Age:    31 years          BP:           120/65 mmHg Patient Gender: F                 HR:           99 bpm. Exam Location:  Inpatient  Procedure: 2D Echo, Cardiac Doppler and Color Doppler  Indications:    Chest pain  History:        Patient has prior history of Echocardiogram examinations, most recent 08/15/2021. Risk Factors:Diabetes and Hypertension. Non-ischemic cardiomyopathy. CKD.  Sonographer:     Clayton Lefort RDCS (AE) Referring Phys: [id number] RONDELL A SMITH  IMPRESSIONS   1. Low normal to mild global reduction in LV systolic function; EF 50. 2. Left ventricular ejection fraction, by estimation, is 50 to 55%. The left ventricle has low normal function. The left ventricle  has no regional wall motion abnormalities. Left ventricular diastolic parameters were normal. 3. Right ventricular systolic function is normal. The right ventricular size is normal. There is mildly elevated pulmonary artery systolic pressure. 4. The mitral valve is normal in structure. Mild mitral valve regurgitation. No evidence of mitral stenosis. 5. Tricuspid valve regurgitation is mild to moderate. 6. The aortic valve is tricuspid. Aortic valve regurgitation is mild. No aortic stenosis is present. 7. The inferior vena cava is normal in size with greater than 50% respiratory variability, suggesting right atrial pressure of 3 mmHg.  FINDINGS Left Ventricle: Left ventricular ejection fraction, by estimation, is 50 to 55%. The left ventricle has low normal function. The left ventricle has no regional wall motion abnormalities. The left ventricular internal cavity size was normal in size. There is no left ventricular hypertrophy. Left ventricular diastolic parameters were normal.  Right Ventricle: The right ventricular size is normal. Right ventricular systolic function is normal. There is mildly elevated pulmonary artery systolic pressure. The tricuspid regurgitant velocity is 2.69 m/s, and with an assumed right atrial pressure of 8 mmHg, the estimated right ventricular systolic pressure is 64.3 mmHg.  Left Atrium: Left atrial size was normal in size.  Right Atrium: Right atrial size was normal in size.  Pericardium: There is no evidence of pericardial effusion.  Mitral Valve: The mitral valve is normal in structure. Mild mitral valve regurgitation. No evidence of mitral valve stenosis.  Tricuspid Valve: The  tricuspid valve is normal in structure. Tricuspid valve regurgitation is mild to moderate. No evidence of tricuspid stenosis.  Aortic Valve: The aortic valve is tricuspid. Aortic valve regurgitation is mild. No aortic stenosis is present. Aortic valve mean gradient measures 4.0 mmHg. Aortic valve peak gradient measures 7.3 mmHg. Aortic valve area, by VTI measures 1.92 cm.  Pulmonic Valve: The pulmonic valve was normal in structure. Pulmonic valve regurgitation is trivial. No evidence of pulmonic stenosis.  Aorta: The aortic root is normal in size and structure.  Venous: The inferior vena cava is normal in size with greater than 50% respiratory variability, suggesting right atrial pressure of 3 mmHg.  IAS/Shunts: No atrial level shunt detected by color flow Doppler.  Additional Comments: Low normal to mild global reduction in LV systolic function; EF 50.   LEFT VENTRICLE PLAX 2D LVIDd:         4.50 cm LVIDs:         3.40 cm LV PW:         0.90 cm LV IVS:        1.00 cm LVOT diam:     1.90 cm LV SV:         42 LV SV Index:   27 LVOT Area:     2.84 cm   RIGHT VENTRICLE             IVC RV Basal diam:  2.70 cm     IVC diam: 1.60 cm RV S prime:     11.40 cm/s TAPSE (M-mode): 1.9 cm  LEFT ATRIUM             Index        RIGHT ATRIUM           Index LA diam:        2.40 cm 1.52 cm/m   RA Area:     17.00 cm LA Vol (A2C):   14.6 ml 9.25 ml/m   RA Volume:   49.10 ml  31.11 ml/m LA Vol (A4C):  28.5 ml 18.06 ml/m LA Biplane Vol: 21.3 ml 13.50 ml/m AORTIC VALVE AV Area (Vmax):    1.84 cm AV Area (Vmean):   1.89 cm AV Area (VTI):     1.92 cm AV Vmax:           135.00 cm/s AV Vmean:          88.500 cm/s AV VTI:            0.220 m AV Peak Grad:      7.3 mmHg AV Mean Grad:      4.0 mmHg LVOT Vmax:         87.80 cm/s LVOT Vmean:        59.100 cm/s LVOT VTI:          0.149 m LVOT/AV VTI ratio: 0.68  AORTA Ao Root diam: 2.50 cm Ao Asc diam:  2.80 cm  TRICUSPID VALVE TR  Peak grad:   28.9 mmHg TR Vmax:        269.00 cm/s  SHUNTS Systemic VTI:  0.15 m Systemic Diam: 1.90 cm  Kirk Ruths MD Electronically signed by Kirk Ruths MD Signature Date/Time: 12/03/2021/12:44:00 PM    Final      CARDIAC MRI  MR CARDIAC MORPHOLOGY W WO CONTRAST 12/01/2020  Narrative CLINICAL DATA:  70F with chronic systolic heart failure p/w cardiogenic shock. Echo with EF 20-25% with moderate RV dysfunction  Cardiac MRI  08/04/2017 ([facility]):  1. The left ventricle is normal in size and has moderately decreased function EF 42% There is mid to basal inferoseptal hypokinesis. The right ventricle is normal in size and has mildly decreased function.  2. There is evidence of subepicardial enhancement of the mid inferoseptal wall segment, which is compatible with myocarditis. There is also a very small focal subendocardial enhancement in the mid inferoseptum which is compatible with infarction.  3. There is evidence of myocardial edema of the mid epicardial inferoseptum which is compatible with acute myocarditis.  4. Small circumferential pericardial effusion  EXAM: CARDIAC MRI  TECHNIQUE: The patient was scanned on a 1.5 Tesla Siemens magnet. A dedicated cardiac coil was used. Functional imaging was done using Fiesta sequences. 2,3, and 4 chamber views were done to assess for RWMA's. Modified Simpson's rule using a short axis stack was used to calculate an ejection fraction on a dedicated work Conservation officer, nature. The patient received 6 cc of Gadavist. After 10 minutes inversion recovery sequences were used to assess for infiltration and scar tissue.  CONTRAST:  6 cc  of Gadavist  FINDINGS: Left ventricle:  -Mild dilatation  -Severe systolic dysfunction  -Mild nonspecific ECV elevation (28%)  -Normal global and regional T2 values  -Midwall LGE in basal to mid anterior/anterolateral walls  LV EF:  29% (Normal 56-78%)  Absolute  volumes:  LV EDV: 143m (Normal 52-141 mL)  LV ESV: 1023m(Normal 13-51 mL)  LV SV: 4261mNormal 33-97 mL)  CO: 5.1L/min (Normal 2.7-6.0 L/min)  Indexed volumes:  LV EDV: 43m43m-m (Normal 41-81 mL/sq-m)  LV ESV: 70mL44mm (Normal 12-21 mL/sq-m)  LV SV: 28mL/75m (Normal 26-56 mL/sq-m)  CI: 3.4L/min/sq-m (Normal 1.8-3.8 L/min/sq-m)  Right ventricle: Normal size and systolic function  RV EF: 55% (Normal 47-80%)  Absolute volumes:  RV EDV: 89mL (58mal 58-154 mL)  RV ESV: 40mL (N3ml 12-68 mL)  RV SV: 49mL (No8m 35-98 mL)  CO: 5.9L/min (Normal 2.7-6 L/min)  Indexed volumes:  RV EDV: 60mL/sq-m78mrmal 48-87 mL/sq-m)  RV ESV: 27mL/sq-m 78mmal 11-28 mL/sq-m)  RV  SV: 93m/sq-m (Normal 27-57 mL/sq-m)  CI: 4.0L/min/sq-m (Normal 1.8-3.8 L/min/sq-m)  Left atrium: Normal size  Right atrium: Normal size  Mitral valve: Mild regurgitation  Aortic valve: Mild regurgitation  Tricuspid valve: Moderate regurgitation  Pulmonic valve: No regurgitation  Aorta: Normal proximal ascending aorta  Pericardium: Moderate effusion measuring up to 1cm adjacent to RV free wall  IMPRESSION: 1. Midwall late gadolinium enhancement in basal to mid anterior/anterolateral walls. Differential diagnosis includes prior myocarditis (normal T2 values suggests no evidence of acute myocarditis) versus sarcoidosis. Would recommend cardiac PET to evaluate for sarcoid.  2.  Mild LV dilatation with severe systolic dysfunction (EF 209%  3.  Normal RV size and systolic function (EF 540%  4. Moderate pericardial effusion measuring up to 1cm adjacent to RV free wall   Electronically Signed By: COswaldo MilianM.D. On: 12/01/2020 23:21          Patient Profile     31y.o. female with volume overload  Assessment & Plan    Acute on Chronic Systolic Heart Failure - NICM. - Echo (1/22): EF 35-40% Mod MR - [facility] 12/06/21: RA 15, PA 37/23 (28), PCWP 16, Fick CO/CI 4.2/2.6,  Thermo CO/CI 3.7/2.3, PVR 4.3, PA sat 51%, 54%, PAPi 0.93 - 1.4 L out on torseminde 80 mg PO BID; has not responded to lasix historically - Entresto 24-26 mg bid.  - Continue Corlanor 7.5 mg bid.  - Off Jardiance due to frequent GU infections.  - Off spiro w/ hyperkalemia, K+ normal now - renal function worsening w/ diuresis BUN/Cr now 56/2.73     CAD - Single vessel RCA occlusion (small co-dominant vessel) on cardiac cath 09/08. - Medical management.  - Depending on lead placement; she has q waves or ST elevation in inferior leads; if "new and different CP" get troponin - Continue aspirin + statin.   CKD IV - Baseline Scr ~2.5 - hopefully will avoid HD this admission - Cr > 3 in Sept, trending up to 2.73 today  Pulmonary hypertension with cor pulmonale - [facility] 12/06/21: RA 15, PA 37/23 (28), PCWP 16, Fick CO/CI 4.2/2.6, Thermo CO/CI 3.7/2.3, PVR 4.3, PA sat 51%, 54%, PAPi 0.93 - sildenafil restart 02/11/22 (received nitro during aborted cath activiation)   Severe hyperTG - Due to LPL deficiency w/ recent pancreatitis. - Continue fenofibrate  - she is followed by endocriology at [facility]   Type 3c DM - per primary   RRosaria Ferries PA-C 02/11/2022 10:46 AM   For questions or updates, please contact [facility] Please consult [email or web address] for contact info under Cardiology/STEMI.  Personally seen and examined. Agree with APP above with the following comments:  Patient notes breathing back to baseline No further SOB.   Reviewed her [employer] drink and the relationship of excess sugar and volume status. Creatinine increased  Exam notable for distended abdomen, otherwise similar to prior. Tele: SR with PVCs  Would recommend  - as above: resume her sildenafil; transition to torsemide 80 mg PO daily and BNP tomorrow, may be near euvolemic - we discussed her outside food may may things more challenging; I am worried she is high risk for re-admission  MRudean Haskell MD FASE [address] [place] [phone] 12:48 PM

## 2022-02-11 NOTE — Progress Notes (Signed)
Progress Note   Patient: Jennifer [name] ZLD:[id number] DOB: 1991/03/06 DOA: 02/06/2022     5 DOS: the patient was seen and examined on 02/11/2022   Brief hospital course: Mrs. Ehle is a 31 y.o. F with hx astrocytoma s/p resection and chemo, hypertriglyceridemia and chronic pancreatitis, CKD IV baseline Cr 2.0, hypothyroidism, NICM and sCHF EF 40-45%, and DM who presented with 24 hours abdominal pain and vomiting.  In the ER, glucose >300, triglycerides >5000 (outpatient range appears to be between 150 and 1500).  Lipase normal and CT abdomen unremarkable.  Cr at baseline.  Admitted for presumed pancreatitis.  Assessment and Plan: Abdominal pain likely 2/2 ?pancreatitis Vs ?gastroparesis Normal lipase, given similarity to previous episodes, epigastric pian, elevated triglycerides CT abd/pelvis unremarkable USS abdomen showed hepatic steatosis, mild splenomegaly, otherwise unremarkable Correct hypertriglyceridemia as below Pain management, antiemetics   Hypertriglyceridemia Patient's endocrinology Dr Oval Linsey from [facility] agreed with the medical plan and treatment for presumed hypertriglyceridemia with insulin infusion. Recommended holding the Humulin RU-500, which typically needs to be monitored and prescribed by an endocrinologist, and also should not be given while the patient is kept n.p.o. The patient could instead be restarted on Regular Insulin U100 when she is transitioned off of the insulin infusion back to a regular diet, while in the hospital." Changed Insulin infusion for triglyceride protocol specifically  Trend triglycerides, transition from insulin drip to [place] insulin when TG <1000 if attainable Continue fibrate and Crestor TG down to 2054 today Now on regular diet, in order to continue IV insulin to drop TG   Type 2 diabetes mellitus with hyperlipidemia (HCC) Hyperglycemic on admission, resolved with insulin drip See above re: endocrinology recommendations for U100 vs  U500 Continue insulin drip for now Plan to transition to U100 when trigs <1000   NASH (nonalcoholic steatohepatitis) Mildly elevated LFTs, AST/ALT LFTs baseline range in last six months 80s-120s Recheck cmp in AM   Acute on chronic systolic CHF (congestive heart failure) ([place]) Has Cardiomems, follows with Dr. Haroldine Laws S/p gentle IV fluids Cardiology consulted, appreciate recs Daily weights, strict I/Os Continue home ivabradine, hydralazine, sildenafil, Entresto Torsemide increased to 30m per Cardiology. Pt historically did not tolerate lasix Cardiology following   CKD (chronic kidney disease) stage 4, GFR 15-29 ml/min (HCC) Cr stable relative to baseline 2.2 Daily CMP   Normocytic anemia Hgb at baseline Daily CBC   Essential hypertension BP trends stable Continue hydralazine, torsemide   Pulmonary hypertension (HCC) Continue torsemide and sildenafil   Hypothyroidism Continue levothyroxine   Obesity (BMI 30-39.9) Lifestyle modification advised  Chest pain -New chest pains recently -EKG reviewed, possible ST elevation in inferior leads -Initial plan was possible cath, however on Cardiology re-eval, was determined to cancel cath given CKD and prior RCA disease with trop <100 Per Cardiology, cont torsemide and GDMT meds as per above      Subjective: Eager to go home soon.   Physical Exam: Vitals:   02/11/22 1100 02/11/22 1200 02/11/22 1241 02/11/22 1300  BP:   (!) 107/51   Pulse: 77 70 71 69  Resp: 19 13 19 13   Temp:  98.3 F (36.8 C)    TempSrc:  Oral    SpO2: 99% 99% 99% 100%  Weight:      Height:       General exam: Awake, laying in bed, in nad Respiratory system: Normal respiratory effort, no wheezing Cardiovascular system: regular rate, s1, s2 Gastrointestinal system: Soft, nondistended, positive BS Central nervous system: CN2-12 grossly intact, strength intact  Extremities: Perfused, no clubbing Skin: Normal skin turgor, no notable skin lesions  seen Psychiatry: Mood normal // no visual hallucinations   Data Reviewed:  Labs reviewed: Na 134, K 4.1, Cr 2.73, Hgb 9.3  Family Communication: Pt in room, family at bedside  Disposition: Status is: Inpatient Remains inpatient appropriate because: severity of illness  Planned Discharge Destination: Home   Author: Marylu Lund, MD 02/11/2022 2:35 PM  For on call review [email or web address].

## 2022-02-11 NOTE — Inpatient Diabetes Management (Signed)
Inpatient Diabetes Program Recommendations  AACE/ADA: New Consensus Statement on Inpatient Glycemic Control (2015)  Target Ranges:  Prepandial:   less than 140 mg/dL      Peak postprandial:   less than 180 mg/dL (1-2 hours)      Critically ill patients:  140 - 180 mg/dL   Lab Results  Component Value Date   GLUCAP 118 (H) 02/11/2022   HGBA1C 7.9 (H) 08/15/2021    Review of Glycemic Control  Latest Reference Range & Units 02/11/22 00:02 02/11/22 02:07 02/11/22 03:53 02/11/22 06:14 02/11/22 08:38  Glucose-Capillary 70 - 99 mg/dL 230 (H) 198 (H) 167 (H) 164 (H) 118 (H)  (H): Data is abnormally high Diabetes history: DM Type 3c (per [facility]) Outpatient Diabetes medications: U-500 270 units TID, 100 units at HS Current orders for Inpatient glycemic control: IV insulin    Inpatient Diabetes Program Recommendations:    When ready to transition based on triglycerides could consider: -Semglee 60 units two hours prior to discontinuation of Iv insulin, then BID to follow -Novolog 20 units TID (assuming patient is consuming >50% of meals) -Novolog 0-20 units TID & HS  Thanks, Bronson Curb, MSN, RNC-OB Diabetes Coordinator [phone] (8a-5p)

## 2022-02-12 DIAGNOSIS — I5043 Acute on chronic combined systolic (congestive) and diastolic (congestive) heart failure: Secondary | ICD-10-CM

## 2022-02-12 DIAGNOSIS — R109 Unspecified abdominal pain: Secondary | ICD-10-CM | POA: Diagnosis not present

## 2022-02-12 LAB — CBC WITH DIFFERENTIAL/PLATELET
Abs Immature Granulocytes: 0.05 10*3/uL (ref 0.00–0.07)
Basophils Absolute: 0 10*3/uL (ref 0.0–0.1)
Basophils Relative: 1 %
Eosinophils Absolute: 0.1 10*3/uL (ref 0.0–0.5)
Eosinophils Relative: 3 %
HCT: 28.6 % — ABNORMAL LOW (ref 36.0–46.0)
Hemoglobin: 9.3 g/dL — ABNORMAL LOW (ref 12.0–15.0)
Immature Granulocytes: 1 %
Lymphocytes Relative: 25 %
Lymphs Abs: 1.3 10*3/uL (ref 0.7–4.0)
MCH: 30.1 pg (ref 26.0–34.0)
MCHC: 32.5 g/dL (ref 30.0–36.0)
MCV: 92.6 fL (ref 80.0–100.0)
Monocytes Absolute: 0.5 10*3/uL (ref 0.1–1.0)
Monocytes Relative: 11 %
Neutro Abs: 3.1 10*3/uL (ref 1.7–7.7)
Neutrophils Relative %: 59 %
Platelets: 147 10*3/uL — ABNORMAL LOW (ref 150–400)
RBC: 3.09 MIL/uL — ABNORMAL LOW (ref 3.87–5.11)
RDW: 16 % — ABNORMAL HIGH (ref 11.5–15.5)
WBC: 5.1 10*3/uL (ref 4.0–10.5)
nRBC: 0 % (ref 0.0–0.2)

## 2022-02-12 LAB — GLUCOSE, CAPILLARY
Glucose-Capillary: 208 mg/dL — ABNORMAL HIGH (ref 70–99)
Glucose-Capillary: 224 mg/dL — ABNORMAL HIGH (ref 70–99)
Glucose-Capillary: 235 mg/dL — ABNORMAL HIGH (ref 70–99)
Glucose-Capillary: 235 mg/dL — ABNORMAL HIGH (ref 70–99)
Glucose-Capillary: 262 mg/dL — ABNORMAL HIGH (ref 70–99)
Glucose-Capillary: 267 mg/dL — ABNORMAL HIGH (ref 70–99)
Glucose-Capillary: 268 mg/dL — ABNORMAL HIGH (ref 70–99)
Glucose-Capillary: 270 mg/dL — ABNORMAL HIGH (ref 70–99)
Glucose-Capillary: 280 mg/dL — ABNORMAL HIGH (ref 70–99)
Glucose-Capillary: 310 mg/dL — ABNORMAL HIGH (ref 70–99)
Glucose-Capillary: 313 mg/dL — ABNORMAL HIGH (ref 70–99)
Glucose-Capillary: 368 mg/dL — ABNORMAL HIGH (ref 70–99)

## 2022-02-12 LAB — COMPREHENSIVE METABOLIC PANEL
ALT: 65 U/L — ABNORMAL HIGH (ref 0–44)
AST: 60 U/L — ABNORMAL HIGH (ref 15–41)
Albumin: 3.4 g/dL — ABNORMAL LOW (ref 3.5–5.0)
Alkaline Phosphatase: 93 U/L (ref 38–126)
Anion gap: 18 — ABNORMAL HIGH (ref 5–15)
BUN: 67 mg/dL — ABNORMAL HIGH (ref 6–20)
CO2: 26 mmol/L (ref 22–32)
Calcium: 8.8 mg/dL — ABNORMAL LOW (ref 8.9–10.3)
Chloride: 89 mmol/L — ABNORMAL LOW (ref 98–111)
Creatinine, Ser: 3.4 mg/dL — ABNORMAL HIGH (ref 0.44–1.00)
GFR, Estimated: 18 mL/min — ABNORMAL LOW (ref >60)
Glucose, Bld: 247 mg/dL — ABNORMAL HIGH (ref 70–99)
Potassium: 4.5 mmol/L (ref 3.5–5.1)
Sodium: 133 mmol/L — ABNORMAL LOW (ref 135–145)
Total Bilirubin: 0.6 mg/dL (ref 0.3–1.2)
Total Protein: 6.5 g/dL (ref 6.5–8.1)

## 2022-02-12 LAB — LIPASE, BLOOD: Lipase: 25 U/L (ref 11–51)

## 2022-02-12 LAB — TRIGLYCERIDES
Triglycerides: 2299 mg/dL — ABNORMAL HIGH (ref <150)
Triglycerides: 2059 mg/dL — ABNORMAL HIGH (ref <150)

## 2022-02-12 LAB — BRAIN NATRIURETIC PEPTIDE: B Natriuretic Peptide: 68.4 pg/mL (ref 0.0–100.0)

## 2022-02-12 MED ORDER — HYDROMORPHONE HCL 1 MG/ML IJ SOLN: 0.5000 mg | INTRAMUSCULAR | Status: DC | PRN

## 2022-02-12 MED ORDER — HYDROMORPHONE HCL 1 MG/ML IJ SOLN
0.5000 mg | INTRAMUSCULAR | Status: DC | PRN
  Administered 2022-02-12: 0.5 mg via SUBCUTANEOUS
  Filled 2022-02-12: qty 1

## 2022-02-12 MED ORDER — TORSEMIDE 20 MG PO TABS
40.0000 mg | ORAL_TABLET | Freq: Every day | ORAL | Status: DC
  Administered 2022-02-12 – 2022-02-13 (×2): 40 mg via ORAL
  Filled 2022-02-12 (×2): qty 2

## 2022-02-12 MED ORDER — SODIUM CHLORIDE 0.9 % IV BOLUS
250.0000 mL | Freq: Once | INTRAVENOUS | Status: AC
  Administered 2022-02-12: 250 mL via INTRAVENOUS

## 2022-02-12 MED ORDER — HYDROMORPHONE HCL 1 MG/ML IJ SOLN
0.5000 mg | INTRAMUSCULAR | Status: DC | PRN
  Administered 2022-02-12 (×3): 0.5 mg via INTRAVENOUS
  Filled 2022-02-12 (×4): qty 1

## 2022-02-12 MED ORDER — FLUCONAZOLE 150 MG PO TABS: 150.0000 mg | ORAL_TABLET | Freq: Once | ORAL | Status: DC

## 2022-02-12 MED ORDER — NYSTATIN 100000 UNIT/GM EX POWD
Freq: Two times a day (BID) | CUTANEOUS | Status: DC
  Administered 2022-02-12: 1 via TOPICAL
  Filled 2022-02-12 (×2): qty 15

## 2022-02-12 NOTE — Progress Notes (Signed)
Progress Note   Patient: Jennifer [name] IEP:[id number] DOB: March 16, 1991 DOA: 02/06/2022     6 DOS: the patient was seen and examined on 02/12/2022   Brief hospital course: Jennifer [name] is a 31 y.o. F with hx astrocytoma s/p resection and chemo, hypertriglyceridemia and chronic pancreatitis, CKD IV baseline Cr 2.0, hypothyroidism, NICM and sCHF EF 40-45%, and DM who presented with 24 hours abdominal pain and vomiting.  In the ER, glucose >300, triglycerides >5000 (outpatient range appears to be between 150 and 1500).  Lipase normal and CT abdomen unremarkable.  Cr at baseline.  Admitted for presumed pancreatitis.  Assessment and Plan: Abdominal pain likely 2/2 ?pancreatitis Vs ?gastroparesis Normal lipase, given similarity to previous episodes, epigastric pian, elevated triglycerides CT abd/pelvis unremarkable USS abdomen showed hepatic steatosis, mild splenomegaly, otherwise unremarkable Correct hypertriglyceridemia as below Pain management, antiemetics   Hypertriglyceridemia Patient's endocrinology Jennifer [name] from [facility] agreed with the medical plan and treatment for presumed hypertriglyceridemia with insulin infusion. Recommended holding the Humulin RU-500, which typically needs to be monitored and prescribed by an endocrinologist, and also should not be given while the patient is kept n.p.o. The patient could instead be restarted on Regular Insulin U100 when she is transitioned off of the insulin infusion back to a regular diet, while in the hospital." Changed Insulin infusion for triglyceride protocol specifically  Trend triglycerides, transition from insulin drip to  insulin ultimately Continue fibrate and Crestor Now on regular diet, in order to continue IV insulin to drop TG Initial plan was target TG<1000. I called and discussed with Jennifer [name]. TG remains at around 2200 this AM. Per Jennifer [name], Jennifer [name], as pt is asymptomatic from pancreas standpoint  without elevated lipase, can be safe to dc with elevated TG with very close outpt f/u with Jennifer [name]. Agrees with current mangement with recommendation for added fish oil and niacin. More importantly, [name] has very strongly recommended pt to absolutely avoid lipid containing diet for now. Pt updated on recommendations provided by Jennifer [name] and agrees.  Type 2 diabetes mellitus with hyperlipidemia (HCC) Hyperglycemic on admission, resolved with insulin drip See above re: endocrinology recommendations for U100 vs U500 Continue insulin drip for now   NASH (nonalcoholic steatohepatitis) Mildly elevated LFTs, AST/ALT LFTs baseline range in last six months 80s-120s Recheck cmp in AM   Acute on chronic systolic CHF (congestive heart failure) ([place]) Has Cardiomems, follows with Jennifer [name] S/p gentle IV fluids Cardiology consulted, appreciate recs Daily weights, strict I/Os Continue home ivabradine, hydralazine, sildenafil, Entresto Torsemide increased to 35m per Cardiology.  Overnight, pt became hypotensive, requiring IVF resuscitation BP meds and diuretic on hold for now   CKD (chronic kidney disease) stage 4, GFR 15-29 ml/min (HCC) Cr stable relative to baseline 2.2 Daily CMP Pending renal function this AM   Normocytic anemia Hgb at baseline Daily CBC   Essential hypertension BP trends stable Continued hydralazine, torsemide as bp allows   Pulmonary hypertension (HCC) Continues torsemide and sildenafil   Hypothyroidism Continue levothyroxine   Obesity (BMI 30-39.9) Lifestyle modification advised  Chest pain -New chest pains recently -EKG reviewed, possible ST elevation in inferior leads -Initial plan was possible cath, however on Cardiology re-eval, was determined to cancel cath given CKD and prior RCA disease with trop <100 Per Cardiology, cont torsemide and GDMT meds as per above      Subjective: Hoping to go home soon  Physical Exam: Vitals:    02/12/22 1214 02/12/22 1300 02/12/22 1400 02/12/22 1426  BP:  112/71  (!) 98/56  Pulse: 85 82 85 88  Resp: 19 15 16 17   Temp:      TempSrc:      SpO2: 95% 98% 93% 91%  Weight:      Height:       General exam: Conversant, in no acute distress Respiratory system: normal chest rise, clear, no audible wheezing Cardiovascular system: regular rhythm, s1-s2 Gastrointestinal system: Nondistended, nontender, pos BS Central nervous system: No seizures, no tremors Extremities: No cyanosis, no joint deformities Skin: No rashes, no pallor Psychiatry: Affect normal // no auditory hallucinations   Data Reviewed:  Labs reviewed: WBC 5.1, Hgb 9.3, Plts 147  Family Communication: Pt in room, family at bedside  Disposition: Status is: Inpatient Remains inpatient appropriate because: severity of illness  Planned Discharge Destination: Home   Author: Marylu Lund, MD 02/12/2022 3:03 PM  For on call review [email or web address].

## 2022-02-12 NOTE — Progress Notes (Addendum)
       CROSS COVER NOTE  NAME: Jennifer [name] MRN: [id number] DOB : [birth year]    Date of Service   02/12/2022   HPI/Events of Note   Notified by bedside RN of hypotensive episode, 92/47 (58).  All other vital signs stable.  No additional acute changes reported by RN.   Patient has a history of essential hypertension and is on hydralazine and torsemide.  It appears that torsemide was recently increased to 80 mg per cardiology.  Patient also has systolic CHF and is ivabradine, hydralazine, sildenafil, Entresto.  Most recent LVEF of 50 to 55%. Patient has also been receiving IV Dilaudid for abdominal pain management.  Hypotensive episode likely related to above medication regimen.  250 cc IVF bolus ordered and IV Dilaudid dose has been decreased.  Interventions/ Plan   IVF bolus       Raenette Rover, DNP, [place]

## 2022-02-12 NOTE — Progress Notes (Signed)
Progress Note  Patient Name: Jennifer [name] Date of Encounter: 02/12/2022  Primary Cardiologist: Dr. Jeffie Pollock  Subjective   Wants to go home , back to baseline   Inpatient Medications    Scheduled Meds:  aspirin EC  81 mg Oral Daily   carvedilol  3.125 mg Oral BID WC   Chlorhexidine Gluconate Cloth  6 each Topical Daily   DULoxetine  30 mg Oral QHS   enoxaparin (LOVENOX) injection  30 mg Subcutaneous Q24H   fenofibrate  160 mg Oral Daily   ivabradine  7.5 mg Oral BID WC   levothyroxine  125 mcg Oral Q0600   pregabalin  300 mg Oral QHS   rosuvastatin  40 mg Oral Daily   sacubitril-valsartan  1 tablet Oral BID   sildenafil  20 mg Oral Q8H   torsemide  80 mg Oral Daily   Continuous Infusions:  insulin 0.15 Units/kg/hr (02/12/22 0615)   PRN Meds: albuterol, dextrose, diphenhydrAMINE, HYDROmorphone (DILAUDID) injection, ipratropium-albuterol, ondansetron (ZOFRAN) IV, mouth rinse, oxyCODONE, polyvinyl alcohol, prochlorperazine   Vital Signs    Vitals:   02/12/22 0700 02/12/22 0712 02/12/22 0740 02/12/22 0800  BP: (!) 83/62  (!) 86/44 109/78  Pulse: 77  78 78  Resp: 13  14 15   Temp:  98.2 F (36.8 C)    TempSrc:  Oral    SpO2: 98%  99% 99%  Weight:      Height:        Intake/Output Summary (Last 24 hours) at 02/12/2022 0813 Last data filed at 02/12/2022 0615 Gross per 24 hour  Intake 1150.81 ml  Output 1775 ml  Net -624.19 ml   Filed Weights   02/09/22 0500 02/10/22 0500 02/12/22 0600  Weight: 72 kg 71.2 kg 71.2 kg    Telemetry    SR, PVCs - Personally Reviewed  Physical Exam   Gen: no distress, chronically ill appearing   Neck: JVD not seen clearly, mildly elevated HENT: Chronic R eye ptsosis Cardiac: No Rubs or Gallops, systolic murmur, RRR +2 radial pulses Respiratory: Clear to auscultation bilaterally, normal effort, normal respiratory rate GI: Soft, nontender, distended,  no clear fluid wave MS:  moves all extremities Integument:  Hirsuit  Labs    Chemistry Recent Labs  Lab 02/09/22 0449 02/10/22 0448 02/11/22 0436  NA 134* 130* 134*  K 4.4 4.4 4.1  CL 93* 90* 93*  CO2 25 24 28   GLUCOSE 194* 155* 166*  BUN 41* 43* 56*  CREATININE 2.55* 2.33* 2.73*  CALCIUM 9.0 9.6 9.5  PROT 6.4* 7.2 6.8  ALBUMIN 3.5 3.6 3.3*  AST 99* 98* 55*  ALT 96* 102* 73*  ALKPHOS 118 122 102  BILITOT 0.7 0.5 0.7  GFRNONAA 25* 28* 23*  ANIONGAP 16* 16* 13     Hematology Recent Labs  Lab 02/10/22 0448 02/11/22 0436 02/12/22 0713  WBC 5.9 5.3 5.1  RBC 3.35* 3.25* 3.09*  HGB 9.9* 9.3* 9.3*  HCT 30.9* 29.7* 28.6*  MCV 92.2 91.4 92.6  MCH 29.6 28.6 30.1  MCHC 32.0 31.3 32.5  RDW 16.0* 15.9* 16.0*  PLT 138* 127* 147*     Radiology    No results found.  Cardiac Studies   Cardiac Studies & Procedures   CARDIAC CATHETERIZATION  CARDIAC CATHETERIZATION 12/06/2021  Narrative Findings:  RA = 15 RV = 41/15 PA = 37/23 (28) PCW = 16 Fick cardiac output/index = 4.2/2.6 Thermo CO/CI = 3.7/2.3 PVR = 4.3 (TD) Ao sat = 94% PA sat = 51%,  54%  Assessment: 1. Mild PAH with primarily RV failure  Plan/Discussion:  Successful cardiomems implant. Start low-dose milrinone to optimize RV failure.  Glori Bickers, MD 8:47 AM   CARDIAC CATHETERIZATION  CARDIAC CATHETERIZATION 08/05/2021  Narrative Findings:  RA = 12 prominent V waves RV = 47/14 PA = 48/25 (37) PCW = 25 Fick cardiac output/index = 4.4/2.8 PVR =3.7 WU Ao sat = 99% PA sat = 62%, 62%  Assessment: 1. Elevated filling pressures with normal cardiac output 2. Mild to moderate mixed pulmonary HTN with prominent v-waves in RA suggestive of significant TR  Plan/Discussion:  Diurese.  Wil give dose of IV lasix prior to d/c. Add metolazone 2.5 + Kcl 40 once a week. Recheck echo to look at TV. Can consider cautious trial of selective pulmonary vasodilators once fluid status improved.  Glori Bickers, MD 10:17 AM      ECHOCARDIOGRAM  ECHOCARDIOGRAM COMPLETE 12/03/2021  Narrative ECHOCARDIOGRAM REPORT    Patient Name:   Jennifer [name] Date of Exam: 12/03/2021 Medical Rec #:  [id number]         Height:       57.0 in Accession #:    [id number]        Weight:       147.0 lb Date of Birth:  [birth year]         BSA:          1.578 m Patient Age:    31 years          BP:           120/65 mmHg Patient Gender: F                 HR:           99 bpm. Exam Location:  Inpatient  Procedure: 2D Echo, Cardiac Doppler and Color Doppler  Indications:    Chest pain  History:        Patient has prior history of Echocardiogram examinations, most recent 08/15/2021. Risk Factors:Diabetes and Hypertension. Non-ischemic cardiomyopathy. CKD.  Sonographer:    Clayton Lefort RDCS (AE) Referring Phys: [id number] RONDELL A SMITH  IMPRESSIONS   1. Low normal to mild global reduction in LV systolic function; EF 50. 2. Left ventricular ejection fraction, by estimation, is 50 to 55%. The left ventricle has low normal function. The left ventricle has no regional wall motion abnormalities. Left ventricular diastolic parameters were normal. 3. Right ventricular systolic function is normal. The right ventricular size is normal. There is mildly elevated pulmonary artery systolic pressure. 4. The mitral valve is normal in structure. Mild mitral valve regurgitation. No evidence of mitral stenosis. 5. Tricuspid valve regurgitation is mild to moderate. 6. The aortic valve is tricuspid. Aortic valve regurgitation is mild. No aortic stenosis is present. 7. The inferior vena cava is normal in size with greater than 50% respiratory variability, suggesting right atrial pressure of 3 mmHg.  FINDINGS Left Ventricle: Left ventricular ejection fraction, by estimation, is 50 to 55%. The left ventricle has low normal function. The left ventricle has no regional wall motion abnormalities. The left ventricular internal cavity size was normal in  size. There is no left ventricular hypertrophy. Left ventricular diastolic parameters were normal.  Right Ventricle: The right ventricular size is normal. Right ventricular systolic function is normal. There is mildly elevated pulmonary artery systolic pressure. The tricuspid regurgitant velocity is 2.69 m/s, and with an assumed right atrial pressure of 8 mmHg, the estimated right ventricular systolic pressure is 36.9  mmHg.  Left Atrium: Left atrial size was normal in size.  Right Atrium: Right atrial size was normal in size.  Pericardium: There is no evidence of pericardial effusion.  Mitral Valve: The mitral valve is normal in structure. Mild mitral valve regurgitation. No evidence of mitral valve stenosis.  Tricuspid Valve: The tricuspid valve is normal in structure. Tricuspid valve regurgitation is mild to moderate. No evidence of tricuspid stenosis.  Aortic Valve: The aortic valve is tricuspid. Aortic valve regurgitation is mild. No aortic stenosis is present. Aortic valve mean gradient measures 4.0 mmHg. Aortic valve peak gradient measures 7.3 mmHg. Aortic valve area, by VTI measures 1.92 cm.  Pulmonic Valve: The pulmonic valve was normal in structure. Pulmonic valve regurgitation is trivial. No evidence of pulmonic stenosis.  Aorta: The aortic root is normal in size and structure.  Venous: The inferior vena cava is normal in size with greater than 50% respiratory variability, suggesting right atrial pressure of 3 mmHg.  IAS/Shunts: No atrial level shunt detected by color flow Doppler.  Additional Comments: Low normal to mild global reduction in LV systolic function; EF 50.   LEFT VENTRICLE PLAX 2D LVIDd:         4.50 cm LVIDs:         3.40 cm LV PW:         0.90 cm LV IVS:        1.00 cm LVOT diam:     1.90 cm LV SV:         42 LV SV Index:   27 LVOT Area:     2.84 cm   RIGHT VENTRICLE             IVC RV Basal diam:  2.70 cm     IVC diam: 1.60 cm RV S prime:      11.40 cm/s TAPSE (M-mode): 1.9 cm  LEFT ATRIUM             Index        RIGHT ATRIUM           Index LA diam:        2.40 cm 1.52 cm/m   RA Area:     17.00 cm LA Vol (A2C):   14.6 ml 9.25 ml/m   RA Volume:   49.10 ml  31.11 ml/m LA Vol (A4C):   28.5 ml 18.06 ml/m LA Biplane Vol: 21.3 ml 13.50 ml/m AORTIC VALVE AV Area (Vmax):    1.84 cm AV Area (Vmean):   1.89 cm AV Area (VTI):     1.92 cm AV Vmax:           135.00 cm/s AV Vmean:          88.500 cm/s AV VTI:            0.220 m AV Peak Grad:      7.3 mmHg AV Mean Grad:      4.0 mmHg LVOT Vmax:         87.80 cm/s LVOT Vmean:        59.100 cm/s LVOT VTI:          0.149 m LVOT/AV VTI ratio: 0.68  AORTA Ao Root diam: 2.50 cm Ao Asc diam:  2.80 cm  TRICUSPID VALVE TR Peak grad:   28.9 mmHg TR Vmax:        269.00 cm/s  SHUNTS Systemic VTI:  0.15 m Systemic Diam: 1.90 cm  Kirk Ruths MD Electronically signed by Kirk Ruths MD Signature  Date/Time: 12/03/2021/12:44:00 PM    Final      CARDIAC MRI  MR CARDIAC MORPHOLOGY W WO CONTRAST 12/01/2020  Narrative CLINICAL DATA:  48F with chronic systolic heart failure p/w cardiogenic shock. Echo with EF 20-25% with moderate RV dysfunction  Cardiac MRI  08/04/2017 ([facility]):  1. The left ventricle is normal in size and has moderately decreased function EF 42% There is mid to basal inferoseptal hypokinesis. The right ventricle is normal in size and has mildly decreased function.  2. There is evidence of subepicardial enhancement of the mid inferoseptal wall segment, which is compatible with myocarditis. There is also a very small focal subendocardial enhancement in the mid inferoseptum which is compatible with infarction.  3. There is evidence of myocardial edema of the mid epicardial inferoseptum which is compatible with acute myocarditis.  4. Small circumferential pericardial effusion  EXAM: CARDIAC MRI  TECHNIQUE: The patient was scanned on a 1.5 Tesla  Siemens magnet. A dedicated cardiac coil was used. Functional imaging was done using Fiesta sequences. 2,3, and 4 chamber views were done to assess for RWMA's. Modified Simpson's rule using a short axis stack was used to calculate an ejection fraction on a dedicated work Conservation officer, nature. The patient received 6 cc of Gadavist. After 10 minutes inversion recovery sequences were used to assess for infiltration and scar tissue.  CONTRAST:  6 cc  of Gadavist  FINDINGS: Left ventricle:  -Mild dilatation  -Severe systolic dysfunction  -Mild nonspecific ECV elevation (28%)  -Normal global and regional T2 values  -Midwall LGE in basal to mid anterior/anterolateral walls  LV EF:  29% (Normal 56-78%)  Absolute volumes:  LV EDV: 173m (Normal 52-141 mL)  LV ESV: 1044m(Normal 13-51 mL)  LV SV: 4269mNormal 33-97 mL)  CO: 5.1L/min (Normal 2.7-6.0 L/min)  Indexed volumes:  LV EDV: 74m26m-m (Normal 41-81 mL/sq-m)  LV ESV: 70mL62mm (Normal 12-21 mL/sq-m)  LV SV: 28mL/53m (Normal 26-56 mL/sq-m)  CI: 3.4L/min/sq-m (Normal 1.8-3.8 L/min/sq-m)  Right ventricle: Normal size and systolic function  RV EF: 55% (Normal 47-80%)  Absolute volumes:  RV EDV: 89mL (77mal 58-154 mL)  RV ESV: 40mL (N67ml 12-68 mL)  RV SV: 49mL (No71m 35-98 mL)  CO: 5.9L/min (Normal 2.7-6 L/min)  Indexed volumes:  RV EDV: 60mL/sq-m92mrmal 48-87 mL/sq-m)  RV ESV: 27mL/sq-m 13mmal 11-28 mL/sq-m)  RV SV: 33mL/sq-m (72mal 27-57 mL/sq-m)  CI: 4.0L/min/sq-m (Normal 1.8-3.8 L/min/sq-m)  Left atrium: Normal size  Right atrium: Normal size  Mitral valve: Mild regurgitation  Aortic valve: Mild regurgitation  Tricuspid valve: Moderate regurgitation  Pulmonic valve: No regurgitation  Aorta: Normal proximal ascending aorta  Pericardium: Moderate effusion measuring up to 1cm adjacent to RV free wall  IMPRESSION: 1. Midwall late gadolinium enhancement in basal to  mid anterior/anterolateral walls. Differential diagnosis includes prior myocarditis (normal T2 values suggests no evidence of acute myocarditis) versus sarcoidosis. Would recommend cardiac PET to evaluate for sarcoid.  2.  Mild LV dilatation with severe systolic dysfunction (EF 29%)  3.  No77%l RV size and systolic function (EF 55%)  4. Mod41%te pericardial effusion measuring up to 1cm adjacent to RV free wall   Electronically Signed By: Christopher Oswaldo Milian/08/2020 23:21          Patient Profile     31 y.o. fema73 with volume overload  Assessment & Plan    Acute on Chronic Systolic Heart Failure - NICM. - Echo (1/22): EF 35-40% Mod MR - [facility] 12/06/21: RA 15, PA  37/23 (28), PCWP 16, Fick CO/CI 4.2/2.6, Thermo CO/CI 3.7/2.3, PVR 4.3, PA sat 51%, 54%, PAPi 0.93 - good diuresis on oral demedex now - Continue Corlanor 7.5 mg bid.  - Off Jardiance due to frequent GU infections.  - Off spiro w/ hyperkalemia, K+ normal now - renal function worsening w/ diuresis BUN/Cr now 56/2.73     CAD - Single vessel RCA occlusion (small co-dominant vessel) on cardiac cath 09/08. - Medical management.  - Depending on lead placement; she has q waves or ST elevation in inferior leads; if "new and different CP" get troponin - Continue aspirin + statin.   CKD IV - Baseline Scr ~2.5 - hopefully will avoid HD this admission - Cr > 3 in Sept, trending up to 2.73 yesterday pending this am with BNP  Pulmonary hypertension with cor pulmonale - [facility] 12/06/21: RA 15, PA 37/23 (28), PCWP 16, Fick CO/CI 4.2/2.6, Thermo CO/CI 3.7/2.3, PVR 4.3, PA sat 51%, 54%, PAPi 0.93 - sildenafil restart 02/11/22 (received nitro during aborted cath activiation)   Severe hyperTG - Due to LPL deficiency w/ recent pancreatitis. - Continue fenofibrate  - she is followed by endocriology at [facility]   Type 3c DM - per primary   Will arrange outpatient f/u with Dr Jeffie Pollock.  If Cr > 3 today would  decrease Demedex to 40 mg daily Cardiology will sign off   Jenkins Rouge, MD 02/12/2022 8:13 AM

## 2022-02-13 DIAGNOSIS — I255 Ischemic cardiomyopathy: Secondary | ICD-10-CM

## 2022-02-13 DIAGNOSIS — R109 Unspecified abdominal pain: Secondary | ICD-10-CM | POA: Diagnosis not present

## 2022-02-13 DIAGNOSIS — I2510 Atherosclerotic heart disease of native coronary artery without angina pectoris: Secondary | ICD-10-CM

## 2022-02-13 LAB — CBC WITH DIFFERENTIAL/PLATELET
Abs Immature Granulocytes: 0.02 10*3/uL (ref 0.00–0.07)
Basophils Absolute: 0 10*3/uL (ref 0.0–0.1)
Basophils Relative: 1 %
Eosinophils Absolute: 0.1 10*3/uL (ref 0.0–0.5)
Eosinophils Relative: 3 %
HCT: 28.7 % — ABNORMAL LOW (ref 36.0–46.0)
Hemoglobin: 9 g/dL — ABNORMAL LOW (ref 12.0–15.0)
Immature Granulocytes: 1 %
Lymphocytes Relative: 32 %
Lymphs Abs: 1.4 10*3/uL (ref 0.7–4.0)
MCH: 28.7 pg (ref 26.0–34.0)
MCHC: 31.4 g/dL (ref 30.0–36.0)
MCV: 91.4 fL (ref 80.0–100.0)
Monocytes Absolute: 0.4 10*3/uL (ref 0.1–1.0)
Monocytes Relative: 10 %
Neutro Abs: 2.4 10*3/uL (ref 1.7–7.7)
Neutrophils Relative %: 53 %
Platelets: 141 10*3/uL — ABNORMAL LOW (ref 150–400)
RBC: 3.14 MIL/uL — ABNORMAL LOW (ref 3.87–5.11)
RDW: 15.9 % — ABNORMAL HIGH (ref 11.5–15.5)
WBC: 4.3 10*3/uL (ref 4.0–10.5)
nRBC: 0 % (ref 0.0–0.2)

## 2022-02-13 LAB — GLUCOSE, CAPILLARY
Glucose-Capillary: 299 mg/dL — ABNORMAL HIGH (ref 70–99)
Glucose-Capillary: 256 mg/dL — ABNORMAL HIGH (ref 70–99)
Glucose-Capillary: 228 mg/dL — ABNORMAL HIGH (ref 70–99)
Glucose-Capillary: 191 mg/dL — ABNORMAL HIGH (ref 70–99)
Glucose-Capillary: 272 mg/dL — ABNORMAL HIGH (ref 70–99)
Glucose-Capillary: 221 mg/dL — ABNORMAL HIGH (ref 70–99)
Glucose-Capillary: 269 mg/dL — ABNORMAL HIGH (ref 70–99)

## 2022-02-13 LAB — BASIC METABOLIC PANEL
Anion gap: 12 (ref 5–15)
BUN: 69 mg/dL — ABNORMAL HIGH (ref 6–20)
CO2: 27 mmol/L (ref 22–32)
Calcium: 9 mg/dL (ref 8.9–10.3)
Chloride: 97 mmol/L — ABNORMAL LOW (ref 98–111)
Creatinine, Ser: 2.63 mg/dL — ABNORMAL HIGH (ref 0.44–1.00)
GFR, Estimated: 24 mL/min — ABNORMAL LOW (ref >60)
Glucose, Bld: 204 mg/dL — ABNORMAL HIGH (ref 70–99)
Potassium: 4 mmol/L (ref 3.5–5.1)
Sodium: 136 mmol/L (ref 135–145)

## 2022-02-13 LAB — LIPASE, BLOOD: Lipase: 25 U/L (ref 11–51)

## 2022-02-13 LAB — TRIGLYCERIDES: Triglycerides: 1529 mg/dL — ABNORMAL HIGH (ref <150)

## 2022-02-13 MED ORDER — INSULIN ASPART 100 UNIT/ML FLEXPEN: PEN_INJECTOR | SUBCUTANEOUS | 0 refills | Status: DC

## 2022-02-13 MED ORDER — CARVEDILOL 3.125 MG PO TABS: 3.1250 mg | ORAL_TABLET | Freq: Two times a day (BID) | ORAL | 0 refills | Status: DC

## 2022-02-13 MED ORDER — TORSEMIDE 40 MG PO TABS: 40.0000 mg | ORAL_TABLET | Freq: Every day | ORAL | 0 refills | Status: DC

## 2022-02-13 MED ORDER — INSULIN ASPART 100 UNIT/ML IJ SOLN
0.0000 [IU] | Freq: Three times a day (TID) | INTRAMUSCULAR | Status: DC
  Administered 2022-02-13: 11 [IU] via SUBCUTANEOUS

## 2022-02-13 MED ORDER — INSULIN ASPART 100 UNIT/ML FLEXPEN: 60.0000 [IU] | PEN_INJECTOR | Freq: Three times a day (TID) | SUBCUTANEOUS | 0 refills | Status: DC

## 2022-02-13 MED ORDER — INSULIN GLARGINE 100 UNIT/ML SOLOSTAR PEN: 60.0000 [IU] | PEN_INJECTOR | Freq: Two times a day (BID) | SUBCUTANEOUS | 0 refills | Status: DC

## 2022-02-13 MED ORDER — INSULIN ASPART 100 UNIT/ML IJ SOLN
20.0000 [IU] | Freq: Three times a day (TID) | INTRAMUSCULAR | Status: DC
  Administered 2022-02-13: 20 [IU] via SUBCUTANEOUS

## 2022-02-13 MED ORDER — INSULIN ASPART 100 UNIT/ML IJ SOLN: 0.0000 [IU] | Freq: Every day | INTRAMUSCULAR | Status: DC

## 2022-02-13 MED ORDER — INSULIN GLARGINE-YFGN 100 UNIT/ML ~~LOC~~ SOLN
60.0000 [IU] | Freq: Two times a day (BID) | SUBCUTANEOUS | Status: DC
  Administered 2022-02-13: 60 [IU] via SUBCUTANEOUS
  Filled 2022-02-13: qty 0.6

## 2022-02-13 MED ORDER — INSULIN PEN NEEDLE 32G X 4 MM MISC: 1.0000 | Freq: Four times a day (QID) | 0 refills | Status: DC

## 2022-02-13 NOTE — Progress Notes (Signed)
  Transition of Care [facility]) Screening Note   Patient Details  Name: Jennifer [name] Date of Birth: 10-01-1990   Transition of Care [facility]) CM/SW Contact:    Kimber Relic, LCSW Phone Number: 02/13/2022, 10:25 AM    Transition of Care Department [facility]) has reviewed patient and no TOC needs have been identified at this time. We will continue to monitor patient advancement through interdisciplinary progression rounds. If new patient transition needs arise, please place a TOC consult.

## 2022-02-13 NOTE — Progress Notes (Signed)
Progress Note  Patient Name: Jennifer [name] Date of Encounter: 02/13/2022  Primary Cardiologist: Dr. Jeffie Pollock  Subjective   No abdominal pain. Seems to downplay her elevated triglycerides and renal failure   Inpatient Medications    Scheduled Meds:  aspirin EC  81 mg Oral Daily   carvedilol  3.125 mg Oral BID WC   Chlorhexidine Gluconate Cloth  6 each Topical Daily   DULoxetine  30 mg Oral QHS   enoxaparin (LOVENOX) injection  30 mg Subcutaneous Q24H   fenofibrate  160 mg Oral Daily   ivabradine  7.5 mg Oral BID WC   levothyroxine  125 mcg Oral Q0600   nystatin   Topical BID   pregabalin  300 mg Oral QHS   rosuvastatin  40 mg Oral Daily   sacubitril-valsartan  1 tablet Oral BID   sildenafil  20 mg Oral Q8H   torsemide  40 mg Oral Daily   Continuous Infusions:  insulin 0.15 Units/kg/hr (02/13/22 0341)   PRN Meds: albuterol, dextrose, diphenhydrAMINE, HYDROmorphone (DILAUDID) injection, ipratropium-albuterol, ondansetron (ZOFRAN) IV, mouth rinse, oxyCODONE, polyvinyl alcohol, prochlorperazine   Vital Signs    Vitals:   02/13/22 0000 02/13/22 0100 02/13/22 0200 02/13/22 0400  BP: (!) 154/89 125/77 103/60   Pulse: 83 87 83 86  Resp: 17 16 15 15   Temp: 98.1 F (36.7 C)   98.1 F (36.7 C)  TempSrc: Oral   Oral  SpO2: 100% 100% 99% 97%  Weight:      Height:        Intake/Output Summary (Last 24 hours) at 02/13/2022 1004 Last data filed at 02/13/2022 0341 Gross per 24 hour  Intake 580.05 ml  Output 125 ml  Net 455.05 ml   Filed Weights   02/09/22 0500 02/10/22 0500 02/12/22 0600  Weight: 72 kg 71.2 kg 71.2 kg    Telemetry    SR, PVCs - Personally Reviewed  Physical Exam   Gen: no distress, chronically ill appearing   Neck: JVD not seen clearly, mildly elevated HENT: Chronic R eye ptsosis Cardiac: No Rubs or Gallops, systolic murmur, RRR +2 radial pulses Respiratory: Clear to auscultation bilaterally, normal effort, normal respiratory  rate GI: Soft, nontender, distended,  no clear fluid wave MS:  moves all extremities Integument: Hirsuit  Labs    Chemistry Recent Labs  Lab 02/10/22 0448 02/11/22 0436 02/12/22 0711  NA 130* 134* 133*  K 4.4 4.1 4.5  CL 90* 93* 89*  CO2 24 28 26   GLUCOSE 155* 166* 247*  BUN 43* 56* 67*  CREATININE 2.33* 2.73* 3.40*  CALCIUM 9.6 9.5 8.8*  PROT 7.2 6.8 6.5  ALBUMIN 3.6 3.3* 3.4*  AST 98* 55* 60*  ALT 102* 73* 65*  ALKPHOS 122 102 93  BILITOT 0.5 0.7 0.6  GFRNONAA 28* 23* 18*  ANIONGAP 16* 13 18*     Hematology Recent Labs  Lab 02/11/22 0436 02/12/22 0713 02/13/22 0520  WBC 5.3 5.1 4.3  RBC 3.25* 3.09* 3.14*  HGB 9.3* 9.3* 9.0*  HCT 29.7* 28.6* 28.7*  MCV 91.4 92.6 91.4  MCH 28.6 30.1 28.7  MCHC 31.3 32.5 31.4  RDW 15.9* 16.0* 15.9*  PLT 127* 147* 141*     Radiology    No results found.  Cardiac Studies   Cardiac Studies & Procedures   CARDIAC CATHETERIZATION  CARDIAC CATHETERIZATION 12/06/2021  Narrative Findings:  RA = 15 RV = 41/15 PA = 37/23 (28) PCW = 16 Fick cardiac output/index = 4.2/2.6 Thermo CO/CI =  3.7/2.3 PVR = 4.3 (TD) Ao sat = 94% PA sat = 51%, 54%  Assessment: 1. Mild PAH with primarily RV failure  Plan/Discussion:  Successful cardiomems implant. Start low-dose milrinone to optimize RV failure.  Glori Bickers, MD 8:47 AM   CARDIAC CATHETERIZATION  CARDIAC CATHETERIZATION 08/05/2021  Narrative Findings:  RA = 12 prominent V waves RV = 47/14 PA = 48/25 (37) PCW = 25 Fick cardiac output/index = 4.4/2.8 PVR =3.7 WU Ao sat = 99% PA sat = 62%, 62%  Assessment: 1. Elevated filling pressures with normal cardiac output 2. Mild to moderate mixed pulmonary HTN with prominent v-waves in RA suggestive of significant TR  Plan/Discussion:  Diurese.  Wil give dose of IV lasix prior to d/c. Add metolazone 2.5 + Kcl 40 once a week. Recheck echo to look at TV. Can consider cautious trial of selective pulmonary  vasodilators once fluid status improved.  Glori Bickers, MD 10:17 AM     ECHOCARDIOGRAM  ECHOCARDIOGRAM COMPLETE 12/03/2021  Narrative ECHOCARDIOGRAM REPORT    Patient Name:   Jennifer [name] Date of Exam: 12/03/2021 Medical Rec #:  [id number]         Height:       57.0 in Accession #:    [id number]        Weight:       147.0 lb Date of Birth:  [birth year]         BSA:          1.578 m Patient Age:    31 years          BP:           120/65 mmHg Patient Gender: F                 HR:           99 bpm. Exam Location:  Inpatient  Procedure: 2D Echo, Cardiac Doppler and Color Doppler  Indications:    Chest pain  History:        Patient has prior history of Echocardiogram examinations, most recent 08/15/2021. Risk Factors:Diabetes and Hypertension. Non-ischemic cardiomyopathy. CKD.  Sonographer:    Clayton Lefort RDCS (AE) Referring Phys: [id number] RONDELL A SMITH  IMPRESSIONS   1. Low normal to mild global reduction in LV systolic function; EF 50. 2. Left ventricular ejection fraction, by estimation, is 50 to 55%. The left ventricle has low normal function. The left ventricle has no regional wall motion abnormalities. Left ventricular diastolic parameters were normal. 3. Right ventricular systolic function is normal. The right ventricular size is normal. There is mildly elevated pulmonary artery systolic pressure. 4. The mitral valve is normal in structure. Mild mitral valve regurgitation. No evidence of mitral stenosis. 5. Tricuspid valve regurgitation is mild to moderate. 6. The aortic valve is tricuspid. Aortic valve regurgitation is mild. No aortic stenosis is present. 7. The inferior vena cava is normal in size with greater than 50% respiratory variability, suggesting right atrial pressure of 3 mmHg.  FINDINGS Left Ventricle: Left ventricular ejection fraction, by estimation, is 50 to 55%. The left ventricle has low normal function. The left ventricle has no regional wall motion  abnormalities. The left ventricular internal cavity size was normal in size. There is no left ventricular hypertrophy. Left ventricular diastolic parameters were normal.  Right Ventricle: The right ventricular size is normal. Right ventricular systolic function is normal. There is mildly elevated pulmonary artery systolic pressure. The tricuspid regurgitant velocity is 2.69 m/s, and with an assumed right  atrial pressure of 8 mmHg, the estimated right ventricular systolic pressure is 04.5 mmHg.  Left Atrium: Left atrial size was normal in size.  Right Atrium: Right atrial size was normal in size.  Pericardium: There is no evidence of pericardial effusion.  Mitral Valve: The mitral valve is normal in structure. Mild mitral valve regurgitation. No evidence of mitral valve stenosis.  Tricuspid Valve: The tricuspid valve is normal in structure. Tricuspid valve regurgitation is mild to moderate. No evidence of tricuspid stenosis.  Aortic Valve: The aortic valve is tricuspid. Aortic valve regurgitation is mild. No aortic stenosis is present. Aortic valve mean gradient measures 4.0 mmHg. Aortic valve peak gradient measures 7.3 mmHg. Aortic valve area, by VTI measures 1.92 cm.  Pulmonic Valve: The pulmonic valve was normal in structure. Pulmonic valve regurgitation is trivial. No evidence of pulmonic stenosis.  Aorta: The aortic root is normal in size and structure.  Venous: The inferior vena cava is normal in size with greater than 50% respiratory variability, suggesting right atrial pressure of 3 mmHg.  IAS/Shunts: No atrial level shunt detected by color flow Doppler.  Additional Comments: Low normal to mild global reduction in LV systolic function; EF 50.   LEFT VENTRICLE PLAX 2D LVIDd:         4.50 cm LVIDs:         3.40 cm LV PW:         0.90 cm LV IVS:        1.00 cm LVOT diam:     1.90 cm LV SV:         42 LV SV Index:   27 LVOT Area:     2.84 cm   RIGHT VENTRICLE              IVC RV Basal diam:  2.70 cm     IVC diam: 1.60 cm RV S prime:     11.40 cm/s TAPSE (M-mode): 1.9 cm  LEFT ATRIUM             Index        RIGHT ATRIUM           Index LA diam:        2.40 cm 1.52 cm/m   RA Area:     17.00 cm LA Vol (A2C):   14.6 ml 9.25 ml/m   RA Volume:   49.10 ml  31.11 ml/m LA Vol (A4C):   28.5 ml 18.06 ml/m LA Biplane Vol: 21.3 ml 13.50 ml/m AORTIC VALVE AV Area (Vmax):    1.84 cm AV Area (Vmean):   1.89 cm AV Area (VTI):     1.92 cm AV Vmax:           135.00 cm/s AV Vmean:          88.500 cm/s AV VTI:            0.220 m AV Peak Grad:      7.3 mmHg AV Mean Grad:      4.0 mmHg LVOT Vmax:         87.80 cm/s LVOT Vmean:        59.100 cm/s LVOT VTI:          0.149 m LVOT/AV VTI ratio: 0.68  AORTA Ao Root diam: 2.50 cm Ao Asc diam:  2.80 cm  TRICUSPID VALVE TR Peak grad:   28.9 mmHg TR Vmax:        269.00 cm/s  SHUNTS Systemic VTI:  0.15 m Systemic Diam:  1.90 cm  Kirk Ruths MD Electronically signed by Kirk Ruths MD Signature Date/Time: 12/03/2021/12:44:00 PM    Final      CARDIAC MRI  MR CARDIAC MORPHOLOGY W WO CONTRAST 12/01/2020  Narrative CLINICAL DATA:  14F with chronic systolic heart failure p/w cardiogenic shock. Echo with EF 20-25% with moderate RV dysfunction  Cardiac MRI  08/04/2017 ([facility]):  1. The left ventricle is normal in size and has moderately decreased function EF 42% There is mid to basal inferoseptal hypokinesis. The right ventricle is normal in size and has mildly decreased function.  2. There is evidence of subepicardial enhancement of the mid inferoseptal wall segment, which is compatible with myocarditis. There is also a very small focal subendocardial enhancement in the mid inferoseptum which is compatible with infarction.  3. There is evidence of myocardial edema of the mid epicardial inferoseptum which is compatible with acute myocarditis.  4. Small circumferential pericardial  effusion  EXAM: CARDIAC MRI  TECHNIQUE: The patient was scanned on a 1.5 Tesla Siemens magnet. A dedicated cardiac coil was used. Functional imaging was done using Fiesta sequences. 2,3, and 4 chamber views were done to assess for RWMA's. Modified Simpson's rule using a short axis stack was used to calculate an ejection fraction on a dedicated work Conservation officer, nature. The patient received 6 cc of Gadavist. After 10 minutes inversion recovery sequences were used to assess for infiltration and scar tissue.  CONTRAST:  6 cc  of Gadavist  FINDINGS: Left ventricle:  -Mild dilatation  -Severe systolic dysfunction  -Mild nonspecific ECV elevation (28%)  -Normal global and regional T2 values  -Midwall LGE in basal to mid anterior/anterolateral walls  LV EF:  29% (Normal 56-78%)  Absolute volumes:  LV EDV: 140m (Normal 52-141 mL)  LV ESV: 1057m(Normal 13-51 mL)  LV SV: 4258mNormal 33-97 mL)  CO: 5.1L/min (Normal 2.7-6.0 L/min)  Indexed volumes:  LV EDV: 30m31m-m (Normal 41-81 mL/sq-m)  LV ESV: 70mL51mm (Normal 12-21 mL/sq-m)  LV SV: 28mL/68m (Normal 26-56 mL/sq-m)  CI: 3.4L/min/sq-m (Normal 1.8-3.8 L/min/sq-m)  Right ventricle: Normal size and systolic function  RV EF: 55% (Normal 47-80%)  Absolute volumes:  RV EDV: 89mL (43mal 58-154 mL)  RV ESV: 40mL (N70ml 12-68 mL)  RV SV: 49mL (No54m 35-98 mL)  CO: 5.9L/min (Normal 2.7-6 L/min)  Indexed volumes:  RV EDV: 60mL/sq-m27mrmal 48-87 mL/sq-m)  RV ESV: 27mL/sq-m 43mmal 11-28 mL/sq-m)  RV SV: 33mL/sq-m (41mal 27-57 mL/sq-m)  CI: 4.0L/min/sq-m (Normal 1.8-3.8 L/min/sq-m)  Left atrium: Normal size  Right atrium: Normal size  Mitral valve: Mild regurgitation  Aortic valve: Mild regurgitation  Tricuspid valve: Moderate regurgitation  Pulmonic valve: No regurgitation  Aorta: Normal proximal ascending aorta  Pericardium: Moderate effusion measuring up to 1cm adjacent to  RV free wall  IMPRESSION: 1. Midwall late gadolinium enhancement in basal to mid anterior/anterolateral walls. Differential diagnosis includes prior myocarditis (normal T2 values suggests no evidence of acute myocarditis) versus sarcoidosis. Would recommend cardiac PET to evaluate for sarcoid.  2.  Mild LV dilatation with severe systolic dysfunction (EF 29%)  3.  No38%l RV size and systolic function (EF 55%)  4. Mod25%te pericardial effusion measuring up to 1cm adjacent to RV free wall   Electronically Signed By: Christopher Oswaldo Milian/08/2020 23:21          Patient Profile     31 y.o. fema82 with volume overload and non ischemic DCM. CRF stage 4 and severe hypertriglyceridemia   Assessment & Plan  Acute on Chronic Systolic Heart Failure - NICM. - Echo (1/22): EF 35-40% Mod MR - [facility] 12/06/21: RA 15, PA 37/23 (28), PCWP 16, Fick CO/CI 4.2/2.6, Thermo CO/CI 3.7/2.3, PVR 4.3, PA sat 51%, 54%, PAPi 0.93 - good diuresis on oral demedex now - Continue Corlanor 7.5 mg bid.  - Off Jardiance due to frequent GU infections.  - Off spiro w/ hyperkalemia, K+ normal now - renal function worsening w/ diuresis BUN/Cr now 56/2.73     CAD - Single vessel RCA occlusion (small co-dominant vessel) on cardiac cath 09/08. - Medical management.  - Depending on lead placement; she has q waves or ST elevation in inferior leads; if "new and different CP" get troponin - Continue aspirin + statin.   CKD IV - Baseline Scr ~3.4 repeat pending  - She knows she will need dialysis at some point - Entresto held - pending repeat would decrease Demedex to 20 mg daily If Cr over 2.5   Pulmonary hypertension with cor pulmonale - [facility] 12/06/21: RA 15, PA 37/23 (28), PCWP 16, Fick CO/CI 4.2/2.6, Thermo CO/CI 3.7/2.3, PVR 4.3, PA sat 51%, 54%, PAPi 0.93 - sildenafil restart 02/11/22    Severe hyperTG - Due to LPL deficiency w/ recent pancreatitis. - Continue fenofibrate  - she is  followed by endocriology at [facility] - complex Rx strategy with insulin per primary service   Type 3c DM - per primary   Will arrange outpatient f/u with Dr Jeffie Pollock.  If Cr > 3 today would decrease Demedex to 20 mg daily Cardiology will sign off   Jenkins Rouge, MD 02/13/2022 10:04 AM

## 2022-02-13 NOTE — Discharge Summary (Signed)
Physician Discharge Summary   Patient: Jennifer [name] MRN: [id number] DOB: 04-Jan-1991  Admit date:     02/06/2022  Discharge date: 02/13/22  Discharge Physician: Marylu Lund   PCP: Sue Lush, PA-C   Recommendations at discharge:    Follow up with PCP in 1-2 weeks Follow up with Endocrinologist as soon as possible Follow up with Cardiology as scheduled  Discharge Diagnoses: Principal Problem:   Abdominal pain Active Problems:   Hypertriglyceridemia   Type 2 diabetes mellitus with hyperlipidemia (HCC)   Hypothyroidism   Chronic systolic CHF (congestive heart failure) (HCC)   Essential hypertension   CKD (chronic kidney disease) stage 4, GFR 15-29 ml/min (HCC)   Pulmonary hypertension (HCC)   NASH (nonalcoholic steatohepatitis)   Obesity (BMI 30-39.9)  Resolved Problems:   * No resolved hospital problems. [facility] Course: Jennifer [name] is a 31 y.o. F with hx astrocytoma s/p resection and chemo, hypertriglyceridemia and chronic pancreatitis, CKD IV baseline Cr 2.0, hypothyroidism, NICM and sCHF EF 40-45%, and DM who presented with 24 hours abdominal pain and vomiting.  In the ER, glucose >300, triglycerides >5000 (outpatient range appears to be between 150 and 1500).  Lipase normal and CT abdomen unremarkable.  Cr at baseline.  Admitted for presumed pancreatitis.  Assessment and Plan: Abdominal pain likely 2/2 ?pancreatitis Vs ?gastroparesis Normal lipase, given similarity to previous episodes, epigastric pian, elevated triglycerides CT abd/pelvis unremarkable USS abdomen showed hepatic steatosis, mild splenomegaly, otherwise unremarkable Plans to correct hypertriglyceridemia as below Pain management, antiemetics   Hypertriglyceridemia Patient's endocrinology Dr Oval Linsey from [facility] agreed with the medical plan and treatment for presumed hypertriglyceridemia with insulin infusion. Recommended holding the Humulin RU-500 -Initial recs per [facility] was to  transition to U100 when transitioning off insulin gtt Changed Insulin infusion for triglyceride protocol specifically  TG continued to trend down, to 1,529 Continue fibrate and Crestor Initial plan was target TG<1000. Called again and discussed with [facility] on-call. TG did trend ultimately down to 1500. Per [facility], [name], as pt is asymptomatic from pancreas standpoint without elevated lipase, can be safe to dc with elevated TG with very close outpt f/u with [facility]. Agrees with current mangement with recommendation for added fish oil and niacin. More importantly, Endocrine has very strongly recommended pt to absolutely avoid lipid containing diet for now. Pt updated on recommendations provided by [facility] and agrees. -Recommend patient follow up very closely with [facility] on d/c -Discussed with Diabetic Coordinator who has been following pt on insulin gtt. Based on calculated insulin requirements, recommendation to d/c on lantus 60 units BID with 20 units meal coverage. Have also prescribed sliding scale coverage as well   Type 2 diabetes mellitus with hyperlipidemia (HCC) Hyperglycemic on admission, resolved with insulin drip Was continued on insulin drip, transitioned to subq insulin per above   NASH (nonalcoholic steatohepatitis) Mildly elevated LFTs, AST/ALT LFTs baseline range in last six months 80s-120s   Acute on chronic systolic CHF (congestive heart failure) ([place]) Has Cardiomems, follows with Dr. Haroldine Laws Cardiology consulted, appreciate recs Continue home ivabradine, sildenafil Torsemide was increased to 60m per Cardiology this visit, however resulted in over diuresis and hypotension needing gentle IVF Given soft bp and renal failure, recommended to continue to hold entresto. Cont coreg, ivabradine, and torsemide 410m as currently ordered, at d/c Recommend close f/u with Cardiology   CKD (chronic kidney disease) stage 4, GFR 15-29 ml/min  (HCC) Cr stable relative to baseline 2.2 Cr peaked to 3.4  with diuresis, later down to 2.6 on d/c   Normocytic anemia Hgb remained at baseline   Essential hypertension Continued on coreg, ivabradine, and torsemide Would titrate bp meds as bp allows   Pulmonary hypertension (HCC) Continues torsemide and sildenafil   Hypothyroidism Continue levothyroxine   Obesity (BMI 30-39.9) Lifestyle modification advised   Chest pain -New chest pains recently -EKG reviewed, possible ST elevation in inferior leads -Initial plan was possible cath, however on Cardiology re-eval, was determined to cancel cath given CKD and prior RCA disease with trop <100 Per Cardiology, cont torsemide and GDMT meds as per above       Consultants: Discussed with patient's primary Endocrinology group Procedures performed:   Disposition: Home Diet recommendation:  Carb modified diet DISCHARGE MEDICATION: Allergies as of 02/13/2022       Reactions   Ketamine Other (See Comments)   In vegetative state for 15 minutes per pt   Erythromycin    Fentanyl Nausea And Vomiting   Maitake Mushroom [maitake] Itching   Itchy throat   Penicillin V Itching   Gi upset   Shellfish Allergy Other (See Comments)   Pt has never had shellfish but tested positive on allergy test. Pt states contrast in CT is okay   Morphine Itching, Rash   Penicillins Rash   Has patient had a PCN reaction causing immediate rash, facial/tongue/throat swelling, SOB or lightheadedness with hypotension: Y Has patient had a PCN reaction causing severe rash involving mucus membranes or skin necrosis: Y Has patient had a PCN reaction that required hospitalization: N Has patient had a PCN reaction occurring within the last 10 years: Y If all of the above answers are "NO", then may proceed with Cephalosporin use.   Prednisone Rash        Medication List     STOP taking these medications    [name] 1/20 1-20 MG-MCG tablet Generic drug:  norethindrone-ethinyl estradiol-FE   hydrALAZINE 25 MG tablet Commonly known as: APRESOLINE   insulin regular human CONCENTRATED 500 UNIT/ML injection Commonly known as: HUMULIN R       TAKE these medications    albuterol 108 (90 Base) MCG/ACT inhaler Commonly known as: VENTOLIN HFA Inhale 1-2 puffs into the lungs every 6 (six) hours as needed for wheezing or shortness of breath.   aspirin EC 81 MG tablet Take 1 tablet (81 mg total) by mouth daily. Swallow whole.   carvedilol 3.125 MG tablet Commonly known as: COREG Take 1 tablet (3.125 mg total) by mouth 2 (two) times daily with a meal.   DULoxetine 30 MG capsule Commonly known as: CYMBALTA Take 30 mg by mouth at bedtime.   fenofibrate 160 MG tablet Take 1 tablet (160 mg total) by mouth daily.   insulin aspart 100 UNIT/ML FlexPen Commonly known as: NOVOLOG Inject 60 Units into the skin 3 (three) times daily with meals.   insulin aspart 100 UNIT/ML FlexPen Commonly known as: NOVOLOG Sliding scale: CBG 70 - 120: 0 units CBG 121 - 150: 3 units CBG 151 - 200: 4 units CBG 201 - 250: 7 units CBG 251 - 300: 11 units CBG 301 - 350: 15 units CBG 351 - 400: 20 units CBG > 400: notify your doctor   insulin glargine 100 UNIT/ML Solostar Pen Commonly known as: LANTUS Inject 60 Units into the skin 2 (two) times daily.   Insulin Pen Needle 32G X 4 MM Misc 1 Device by Does not apply route QID. For use with insulin pens  ivabradine 7.5 MG Tabs tablet Commonly known as: CORLANOR Take 1 tablet (7.5 mg total) by mouth 2 (two) times daily with a meal.   levothyroxine 125 MCG tablet Commonly known as: SYNTHROID Take 1 tablet (125 mcg total) by mouth daily at 6 (six) AM.   loratadine 10 MG tablet Commonly known as: CLARITIN Take 10 mg by mouth daily.   ondansetron 4 MG tablet Commonly known as: ZOFRAN Take 4 mg by mouth every 8 (eight) hours as needed for nausea or vomiting.   pantoprazole 40 MG tablet Commonly known as:  PROTONIX Take 40 mg by mouth daily.   pregabalin 150 MG capsule Commonly known as: LYRICA Take 1 capsule (150 mg total) by mouth at bedtime. What changed: how much to take   prochlorperazine 5 MG tablet Commonly known as: COMPAZINE Take 5 mg by mouth daily.   Repatha 140 MG/ML Sosy Generic drug: Evolocumab Inject 140 mg into the skin See admin instructions. Inject 140 mg subcutaneously every other Sunday evening   rosuvastatin 40 MG tablet Commonly known as: CRESTOR Take 40 mg by mouth daily.   sildenafil 20 MG tablet Commonly known as: REVATIO Take 1 tablet (20 mg total) by mouth 3 (three) times daily.   Torsemide 40 MG Tabs Take 40 mg by mouth daily. Start taking on: February 14, 2022 What changed:  medication strength how much to take   tretinoin 0.05 % cream Commonly known as: RETIN-A Apply 1 application topically at bedtime.        Follow-up Information     Sue Lush, PA-C Follow up in 1 week(s).   Specialty: Physician Assistant Why: Hospital follow up Contact information: [place] [id number] [phone]         [name], Shaune Pascal, MD Follow up.   Specialty: Cardiology Why: as scheduled, Hospital follow up Contact information: [address] [place] [address] [phone]         Caryl Pina, MD. Call.   Specialty: Endocrinology Why: Please call ASAP for follow up, Hospital follow up Contact information: [address] [place] [address] [phone]                Discharge Exam: Filed Weights   02/09/22 0500 02/10/22 0500 02/12/22 0600  Weight: 72 kg 71.2 kg 71.2 kg   General exam: Awake, laying in bed, in nad Respiratory system: Normal respiratory effort, no wheezing Cardiovascular system: regular rate, s1, s2 Gastrointestinal system: Soft, nondistended, positive BS Central nervous system: CN2-12 grossly intact, strength intact Extremities:  Perfused, no clubbing Skin: Normal skin turgor, no notable skin lesions seen Psychiatry: Mood normal // no visual hallucinations   Condition at discharge: fair  The results of significant diagnostics from this hospitalization (including imaging, microbiology, ancillary and laboratory) are listed below for reference.   Imaging Studies: DG CHEST PORT 1 VIEW  Result Date: 02/09/2022 CLINICAL DATA:  CHF EXAM: PORTABLE CHEST 1 VIEW COMPARISON:  CXR 02/08/22 FINDINGS: Unchanged positioning of the implanted cardiac device. Low lung volumes. No pleural effusion. No pneumothorax. Unchanged cardiac and mediastinal contours when compared to prior exam. There are mildly prominent interstitial opacities, which are nonspecific, and may represent sequela of bronchovascular crowding in the setting of low lung volumes and/or pulmonary venous congestion without overt pulmonary edema. No displaced rib fractures. Visualized upper abdomen is unremarkable. IMPRESSION: Pulmonary venous congestion without overt pulmonary edema. Electronically Signed   By: Marin Olp.D.  On: 02/09/2022 11:06   DG CHEST PORT 1 VIEW  Result Date: 02/08/2022 CLINICAL DATA:  Chest pain short of breath EXAM: PORTABLE CHEST 1 VIEW COMPARISON:  Chest 02/06/2022 FINDINGS: Cardiac enlargement. Implanted device overlying the heart is unchanged in position. There is progression of vascular congestion and mild interstitial edema. No effusion. No focal infiltrate. IMPRESSION: Interval progression of vascular congestion and mild interstitial edema. Electronically Signed   By: Franchot Gallo M.D.   On: 02/08/2022 16:22   US Abdomen Complete  Result Date: 02/07/2022 CLINICAL DATA:  Acute generalized abdominal pain. EXAM: ABDOMEN ULTRASOUND COMPLETE COMPARISON:  February 06, 2022.  November 25, 2021. FINDINGS: Gallbladder: No gallstones or wall thickening visualized. No sonographic Murphy sign noted by sonographer. Common bile duct: Diameter: 4 mm  which is within normal limits. Liver: No focal lesion identified. Increased echogenicity of hepatic parenchyma is noted suggesting hepatic steatosis. Portal vein is patent on color Doppler imaging with normal direction of blood flow towards the liver. IVC: No abnormality visualized. Pancreas: Visualized portion unremarkable. Spleen: Calculated volume of 526 cc consistent with mild splenomegaly. Right Kidney: Length: 10.6 cm. Echogenicity within normal limits. No mass or hydronephrosis visualized. Left Kidney: Length: 9.4 cm. 8 mm cyst is noted. Echogenicity within normal limits. No mass or hydronephrosis visualized. Abdominal aorta: No aneurysm visualized. Other findings: None. IMPRESSION: Hepatic steatosis. Mild splenomegaly. No other significant abnormality seen in the abdomen. Electronically Signed   By: Marijo Conception M.D.   On: 02/07/2022 08:21   DG Chest Portable 1 View  Result Date: 02/06/2022 CLINICAL DATA:  31 year old female with abdominal pain, left upper quadrant pain. EXAM: PORTABLE CHEST 1 VIEW COMPARISON:  Noncontrast CT Abdomen and Pelvis today reported separately. FINDINGS: Chest radiographs 02/03/2022 and earlier. First identified on 01/29/2022, there is a small metallic sensor device projecting in the left lower lobe. This resembles a CardioMEMS Heart Failure Sensor. Lower lung volumes. Stable cardiac size and mediastinal contours. Cardiac size remains within normal limits. No pulmonary edema. Allowing for portable technique the lungs are clear. Visualized tracheal air column is within normal limits. Paucity of bowel gas in the upper abdomen. No acute osseous abnormality identified. IMPRESSION: 1. Lower lung volumes. No acute cardiopulmonary abnormality. 2. Stable suspected CardioMEMS Heart Failure Sensor projecting in the left lower lobe. Electronically Signed   By: Genevie Ann M.D.   On: 02/06/2022 04:40   CT ABDOMEN PELVIS WO CONTRAST  Result Date: 02/06/2022 CLINICAL DATA:  31 year old  female with abdominal pain maximal in the left upper quadrant. Nausea vomiting and diarrhea. EXAM: CT ABDOMEN AND PELVIS WITHOUT CONTRAST TECHNIQUE: Multidetector CT imaging of the abdomen and pelvis was performed following the standard protocol without IV contrast. RADIATION DOSE REDUCTION: This exam was performed according to the departmental dose-optimization program which includes automated exposure control, adjustment of the mA and/or kV according to patient size and/or use of iterative reconstruction technique. COMPARISON:  CT Abdomen and Pelvis 03/05/2021. FINDINGS: Lower chest: Negative. Hepatobiliary: Chronic hepatomegaly (approximately 25 cm liver length series 2, image 16) with heterogeneous density compatible with chronic hepatic steatosis, which in retrospect was suggested by generalized decreased liver density last year. Diminutive gallbladder. No perihepatic fluid. Pancreas: Negative noncontrast appearance. Spleen: Negative, no splenomegaly. Adrenals/Urinary Tract: Normal adrenal glands. Symmetric, nonobstructed kidneys with no nephrolithiasis or pararenal inflammation. Decompressed ureters. Mildly distended but otherwise unremarkable urinary bladder. No urinary calculus. Stomach/Bowel: Mildly redundant but otherwise negative large bowel. Mild retained stool. Normal appendix on coronal image 50. Decompressed terminal ileum. No  dilated small bowel. Unremarkable stomach and duodenum. No free air or free fluid. Vascular/Lymphatic: Mild but age advanced Aortoiliac calcified atherosclerosis. Normal caliber abdominal aorta. Vascular patency is not evaluated in the absence of IV contrast. No lymphadenopathy identified. Reproductive: Uterus is diminutive or absent. Bilateral ovaries appears similar to last year, within normal limits. Other: No pelvic free fluid. Musculoskeletal: Negative. IMPRESSION: 1. No acute or inflammatory process identified in the non-contrast abdomen or pelvis, with normal appendix. 2.  But positive for Hepatomegaly and evidence of chronic hepatic steatosis. No splenomegaly or explanation for left upper quadrant pain. 3. Also, age advanced atherosclerosis of the aorta and iliac arteries. Electronically Signed   By: Genevie Ann M.D.   On: 02/06/2022 04:37   DG Chest 2 View  Result Date: 02/03/2022 CLINICAL DATA:  Chest pain EXAM: CHEST - 2 VIEW COMPARISON:  Multiple priors including most recent chest radiograph January 29, 2022. FINDINGS: Normal size heart. Mild central vascular prominence. CardioMEMS projects over the left mediastinum. No focal airspace consolidation or overt pulmonary edema. No acute osseous abnormality. IMPRESSION: Mild central vascular prominence without overt pulmonary edema or focal airspace consolidation. Electronically Signed   By: Dahlia Bailiff M.D.   On: 02/03/2022 17:00   DG Chest Port 1 View  Result Date: 01/29/2022 CLINICAL DATA:  Chest pain EXAM: PORTABLE CHEST 1 VIEW COMPARISON:  Radiograph 12/03/2021 FINDINGS: Stable heart size at the upper limits of normal. No focal consolidation, pleural effusion, or pneumothorax. No acute osseous abnormality. IMPRESSION: No active disease. Electronically Signed   By: Placido Sou M.D.   On: 01/29/2022 23:43    Microbiology: Results for orders placed or performed during the hospital encounter of 02/06/22  Resp Panel by RT-PCR (Flu A&B, Covid) Anterior Nasal Swab     Status: None   Collection Time: 02/06/22  6:17 AM   Specimen: Anterior Nasal Swab  Result Value Ref Range Status   SARS Coronavirus 2 by RT PCR NEGATIVE NEGATIVE Final    Comment: (NOTE) SARS-CoV-2 target nucleic acids are NOT DETECTED.  The SARS-CoV-2 RNA is generally detectable in upper respiratory specimens during the acute phase of infection. The lowest concentration of SARS-CoV-2 viral copies this assay can detect is 138 copies/mL. A negative result does not preclude SARS-Cov-2 infection and should not be used as the sole basis for treatment  or other patient management decisions. A negative result may occur with  improper specimen collection/handling, submission of specimen other than nasopharyngeal swab, presence of viral mutation(s) within the areas targeted by this assay, and inadequate number of viral copies(<138 copies/mL). A negative result must be combined with clinical observations, patient history, and epidemiological information. The expected result is Negative.  Fact Sheet for Patients:  EntrepreneurPulse.com.au  Fact Sheet for Healthcare Providers:  IncredibleEmployment.be  This test is no t yet approved or cleared by the [place] FDA and  has been authorized for detection and/or diagnosis of SARS-CoV-2 by FDA under an Emergency Use Authorization (EUA). This EUA will remain  in effect (meaning this test can be used) for the duration of the COVID-19 declaration under Section 564(b)(1) of the Act, 21 U.S.C.section 360bbb-3(b)(1), unless the authorization is terminated  or revoked sooner.       Influenza A by PCR NEGATIVE NEGATIVE Final   Influenza B by PCR NEGATIVE NEGATIVE Final    Comment: (NOTE) The Xpert Xpress SARS-CoV-2/FLU/RSV plus assay is intended as an aid in the diagnosis of influenza from Nasopharyngeal swab specimens and should not be used as  a sole basis for treatment. Nasal washings and aspirates are unacceptable for Xpert Xpress SARS-CoV-2/FLU/RSV testing.  Fact Sheet for Patients: EntrepreneurPulse.com.au  Fact Sheet for Healthcare Providers: IncredibleEmployment.be  This test is not yet approved or cleared by the [place] FDA and has been authorized for detection and/or diagnosis of SARS-CoV-2 by FDA under an Emergency Use Authorization (EUA). This EUA will remain in effect (meaning this test can be used) for the duration of the COVID-19 declaration under Section 564(b)(1) of the Act, 21 U.S.C. section  360bbb-3(b)(1), unless the authorization is terminated or revoked.  Performed at [facility], [address] [place], [place] [address]   MRSA Next Gen by PCR, Nasal     Status: None   Collection Time: 02/06/22  6:37 PM   Specimen: Nasal Mucosa; Nasal Swab  Result Value Ref Range Status   MRSA by PCR Next Gen NOT DETECTED NOT DETECTED Final    Comment: (NOTE) The GeneXpert MRSA Assay (FDA approved for NASAL specimens only), is one component of a comprehensive MRSA colonization surveillance program. It is not intended to diagnose MRSA infection nor to guide or monitor treatment for MRSA infections. Test performance is not FDA approved in patients less than 68 years old. Performed at [facility], [place] [address], [place], [place] [address]   Urine Culture     Status: None   Collection Time: 02/08/22  8:47 AM   Specimen: Urine, Clean Catch  Result Value Ref Range Status   Specimen Description   Final    URINE, CLEAN CATCH Performed at [facility], [place] [address], [place], [place] [address]    Special Requests   Final    NONE Performed at [facility], [place] [address] [place], [place] [address]    Culture   Final    NO GROWTH Performed at [facility], [place] [address], [place], [place] [address]    Report Status 02/09/2022 FINAL  Final    Labs: CBC: Recent Labs  Lab 02/09/22 0449 02/10/22 0448 02/11/22 0436 02/12/22 0713 02/13/22 0520  WBC 5.1 5.9 5.3 5.1 4.3  NEUTROABS 3.0 3.7 2.9 3.1 2.4  HGB 9.3* 9.9* 9.3* 9.3* 9.0*  HCT 27.8* 30.9* 29.7* 28.6* 28.7*  MCV 91.1 92.2 91.4 92.6 91.4  PLT 140* 138* 127* 147* 263*   Basic Metabolic Panel: Recent Labs  Lab 02/06/22 1905 02/07/22 0529 02/08/22 1700 02/09/22 0449 02/10/22 0448 02/11/22 0436 02/12/22 0711 02/13/22 0859  NA 141   < > 133* 134* 130* 134* 133* 136  K 4.4   < > 5.0 4.4 4.4 4.1 4.5 4.0  CL 101   < > 90* 93* 90*  93* 89* 97*  CO2 20*   < > 25 25 24 28 26 27   GLUCOSE 95   < > 176* 194* 155* 166* 247* 204*  BUN 47*   < > 36* 41* 43* 56* 67* 69*  CREATININE 2.07*   < > 2.48* 2.55* 2.33* 2.73* 3.40* 2.63*  CALCIUM 9.6   < > 9.3 9.0 9.6 9.5 8.8* 9.0  MG 2.0  --  1.9  --   --   --   --   --    < > = values in this interval not displayed.   Liver Function Tests: Recent Labs  Lab 02/08/22 0440 02/09/22 0449 02/10/22 0448 02/11/22 0436 02/12/22 0711  AST 69* 99* 98* 55* 60*  ALT 97* 96* 102* 73* 65*  ALKPHOS  110 118 122 102 93  BILITOT 0.4 0.7 0.5 0.7 0.6  PROT 6.8 6.4* 7.2 6.8 6.5  ALBUMIN 3.6 3.5 3.6 3.3* 3.4*   CBG: Recent Labs  Lab 02/13/22 0402 02/13/22 0655 02/13/22 0754 02/13/22 1220 02/13/22 1633  GLUCAP 256* 228* 191* 272* 221*    Discharge time spent: less than 30 minutes.  Signed: Marylu Lund, MD [facility] 02/13/2022

## 2022-02-14 LAB — HEMOGLOBIN A1C
Hgb A1c MFr Bld: 8.5 % — ABNORMAL HIGH (ref 4.8–5.6)
Mean Plasma Glucose: 197.25 mg/dL

## 2022-02-15 ENCOUNTER — Telehealth (HOSPITAL_COMMUNITY): Payer: Self-pay

## 2022-02-15 ENCOUNTER — Other Ambulatory Visit (HOSPITAL_COMMUNITY): Payer: Self-pay

## 2022-02-15 NOTE — Telephone Encounter (Addendum)
I had sent message to triage ref med changes that was done in the recent admission.  Janett Billow advised for her to increase torsemide to 31m for 3 days and resume 430mdaily dose.  The message did not mention the carvedilol or the hydralazine yet, so I am waiting to hear back on that part.   I went by pt home as planned for this afternoon but [facility] [place] [facility] [place] [place] [facility] [place]  I did call her and text her the instructions for the torsemide and will text you once I hear back on the other meds.   JeJanett Billowot back to me and its ok to continue to start the carvedilol and stop hydralazine. I did text pt to let her know.   KaMarylouise StacksEMStanchfield1/21/2023

## 2022-02-15 NOTE — Progress Notes (Addendum)
Paramedicine Encounter    Patient ID: Jennifer [name], female    DOB: 1991-03-11, 31 y.o.   MRN: [id number]  Pt recently home from [facility].  She reports feeling better today than yesterday. She still looks quite bloated.  She did take her meds yesterday.   Upon hosp d/c the doctors stopped her hydralazine, added back carvedilol and decreased torsemide to 11m daily.  Sent  message to triage to verify with providers there if this change is ok.  She did not have her meds yet this morning.   Meds verified. If I dont hear back by 4pm from clinic about med changes then I will call them and try to find out. Pt is leaving out of town tomor morning and need to get this squared away by end of day.  She has appointment Monday with endo.  Her husband brought her mcdonalds for breakfast this morning-she said she doesn't do this very often she said.  So we talked about as well with the high sodium foods.  Pt reports her breathing is doing good.  Denies dizziness.  Cardiomems elevated at 27 today.  No c/p since she has been home.  Her b/p is elevated this morning-no meds yet today.  Meds verified and pillbox checked.  She had resumed the hydralazine upon herself but I advised her we need to wait to see what providers say about the meds and the changes and go from there.  Triage is going to f/u on message and get back to me by end of day.    CBG PTA-357 BP (!) 150/96   Pulse 91   Resp 18   Wt 150 lb (68 kg)   LMP  (LMP Unknown) Comment: neg preg test  SpO2 98%   BMI 32.46 kg/m  Weight yesterday-150 Last visit weight-146   Patient Care Team: PElinor Parkinsonas PCP - General (Physician Assistant) BJacelyn Pi MD as PCP - Endocrinology (Endocrinology)  Patient Active Problem List   Diagnosis Date Noted   Pulmonary hypertension ([facility] 02/06/2022   NASH (nonalcoholic steatohepatitis) 02/06/2022   Obesity (BMI 30-39.9) 02/06/2022   Heart failure ([facility] 12/03/2021   Acute on  chronic systolic CHF (congestive heart failure) ([facility] 12/02/2021   Elevated troponin 12/02/2021   Pancytopenia ([facility] 12/02/2021   Amenorrhea 12/02/2021   Hyperosmolar hyperglycemic state (HHS) ([facility] 08/26/2021   Small intestinal bacterial overgrowth (SIBO) 08/26/2021   Lower extremity edema 08/26/2021   Hyperkalemia 08/14/2021   Hx of insulin dependent diabetes mellitus 08/14/2021   CKD (chronic kidney disease) stage 4, GFR 15-29 ml/min (HCC) 05/13/2021   Chest pain 03/10/2021   Acute on chronic combined systolic and diastolic CHF (congestive heart failure) (HR[facility] 02/09/2021   History of astrocytoma of brain 02/09/2021   Severe hyperglycemia due to diabetes mellitus (HSan [place] 01/25/2021   Diarrhea    Elevated transaminase level    Acute combined systolic and diastolic heart failure (HCC)    Nonischemic cardiomyopathy (HCC)    Acute decompensated heart failure (HCC)    Elevated liver enzymes    Acute renal failure superimposed on stage 3a chronic kidney disease ([place] 11/26/2020   Abdominal pain 054/27/0623  Chronic systolic CHF (congestive heart failure) ([facility] 11/23/2020   Essential hypertension 11/23/2020   Hypertriglyceridemia 11/23/2020   Type 2 diabetes mellitus with hyperlipidemia (HNicol[facility] 10/10/2018   Hypothyroidism 10/10/2018    Current Outpatient Medications:    albuterol (VENTOLIN HFA) 108 (90 Base) MCG/ACT inhaler, Inhale 1-2 puffs into the lungs every 6 (  six) hours as needed for wheezing or shortness of breath., Disp: , Rfl:    aspirin EC 81 MG EC tablet, Take 1 tablet (81 mg total) by mouth daily. Swallow whole., Disp: 30 tablet, Rfl: 1   DULoxetine (CYMBALTA) 30 MG capsule, Take 30 mg by mouth at bedtime., Disp: , Rfl:    Evolocumab (REPATHA) 140 MG/ML SOSY, Inject 140 mg into the skin See admin instructions. Inject 140 mg subcutaneously every other Sunday evening, Disp: , Rfl:    fenofibrate 160 MG tablet, Take 1 tablet (160 mg total) by mouth daily., Disp: 30 tablet, Rfl: 1    ivabradine (CORLANOR) 7.5 MG TABS tablet, Take 1 tablet (7.5 mg total) by mouth 2 (two) times daily with a meal., Disp: 60 tablet, Rfl: 6   levothyroxine (SYNTHROID) 125 MCG tablet, Take 1 tablet (125 mcg total) by mouth daily at 6 (six) AM., Disp: 30 tablet, Rfl: 1   loratadine (CLARITIN) 10 MG tablet, Take 10 mg by mouth daily., Disp: , Rfl:    pantoprazole (PROTONIX) 40 MG tablet, Take 40 mg by mouth daily., Disp: , Rfl:    pregabalin (LYRICA) 150 MG capsule, Take 1 capsule (150 mg total) by mouth at bedtime. (Patient taking differently: Take 300 mg by mouth at bedtime.), Disp: , Rfl:    prochlorperazine (COMPAZINE) 5 MG tablet, Take 5 mg by mouth daily., Disp: , Rfl:    rosuvastatin (CRESTOR) 40 MG tablet, Take 40 mg by mouth daily., Disp: , Rfl:    sildenafil (REVATIO) 20 MG tablet, Take 1 tablet (20 mg total) by mouth 3 (three) times daily., Disp: 90 tablet, Rfl: 3   torsemide 40 MG TABS, Take 40 mg by mouth daily., Disp: 60 tablet, Rfl: 0   tretinoin (RETIN-A) 0.05 % cream, Apply 1 application topically at bedtime., Disp: , Rfl:    carvedilol (COREG) 3.125 MG tablet, Take 1 tablet (3.125 mg total) by mouth 2 (two) times daily with a meal., Disp: 60 tablet, Rfl: 0   insulin aspart (NOVOLOG) 100 UNIT/ML FlexPen, Inject 60 Units into the skin 3 (three) times daily with meals., Disp: 15 mL, Rfl: 0   insulin aspart (NOVOLOG) 100 UNIT/ML FlexPen, For before meals and at bedtime Sliding scale: CBG 70 - 120: 0 units  CBG 121 - 150: 3 units  CBG 151 - 200: 4 units  CBG 201 - 250: 7 units  CBG 251 - 300: 11 units  CBG 301 - 350: 15 units  CBG 351 - 400: 20 units  CBG > 400: notify your doctor, Disp: 15 mL, Rfl: 0   insulin glargine (LANTUS) 100 UNIT/ML Solostar Pen, Inject 60 Units into the skin 2 (two) times daily., Disp: 15 mL, Rfl: 0   Insulin Pen Needle 32G X 4 MM MISC, 1 Device by Does not apply route QID. For use with insulin pens, Disp: 100 each, Rfl: 0   ondansetron (ZOFRAN) 4 MG tablet, Take 4  mg by mouth every 8 (eight) hours as needed for nausea or vomiting., Disp: , Rfl:  Allergies  Allergen Reactions   Ketamine Other (See Comments)    In vegetative state for 15 minutes per pt   Erythromycin    Fentanyl Nausea And Vomiting   Maitake Mushroom [Maitake] Itching    Itchy throat   Penicillin V Itching    Gi upset   Shellfish Allergy Other (See Comments)    Pt has never had shellfish but tested positive on allergy test. Pt states contrast in CT  is okay   Morphine Itching and Rash   Penicillins Rash    Has patient had a PCN reaction causing immediate rash, facial/tongue/throat swelling, SOB or lightheadedness with hypotension: Y Has patient had a PCN reaction causing severe rash involving mucus membranes or skin necrosis: Y Has patient had a PCN reaction that required hospitalization: N Has patient had a PCN reaction occurring within the last 10 years: Y If all of the above answers are "NO", then may proceed with Cephalosporin use.    Prednisone Rash      Social History   Socioeconomic History   Marital status: Married    Spouse name: Not on file   Number of children: 0   Years of education: Not on file   Highest education level: Not on file  Occupational History   Occupation: [employer]  Tobacco Use   Smoking status: Never   Smokeless tobacco: Never  Vaping Use   Vaping Use: Never used  Substance and Sexual Activity   Alcohol use: Never   Drug use: Never   Sexual activity: Yes  Other Topics Concern   Not on file  Social History Narrative   Not on file   Social Determinants of Health   Financial Resource Strain: Low Risk  (12/10/2021)   Overall Financial Resource Strain (CARDIA)    Difficulty of Paying Living Expenses: Not hard at all  Food Insecurity: No Food Insecurity (02/08/2022)   Hunger Vital Sign    Worried About Running Out of Food in the Last Year: Never true    [place] in the Last Year: Never true  Transportation Needs: No  Transportation Needs (02/08/2022)   PRAPARE - Hydrologist (Medical): No    Lack of Transportation (Non-Medical): No  Physical Activity: Sufficiently Active (12/10/2021)   Exercise Vital Sign    Days of Exercise per Week: 3 days    Minutes of Exercise per Session: 60 min  Stress: No Stress Concern Present (12/10/2021)   [place]    Feeling of Stress : Not at all  Social Connections: [place] (12/10/2021)   Social Connection and Isolation Panel [NHANES]    Frequency of Communication with Friends and Family: More than three times a week    Frequency of Social Gatherings with Friends and Family: More than three times a week    Attends Religious Services: More than 4 times per year    Active Member of Clubs or Organizations: No    Attends Music therapist: More than 4 times per year    Marital Status: Married  Human resources officer Violence: Not At Risk (02/08/2022)   Humiliation, Afraid, Rape, and Kick questionnaire    Fear of Current or Ex-Partner: No    Emotionally Abused: No    Physically Abused: No    Sexually Abused: No    Physical Exam      Future Appointments  Date Time Provider [place]  04/28/2022 10:00 AM [facility] PA/NP [facility] None       [name], EMT-P-Paramedic [place] Par[facility]ic  02/15/22

## 2022-02-21 ENCOUNTER — Encounter (HOSPITAL_BASED_OUTPATIENT_CLINIC_OR_DEPARTMENT_OTHER): Payer: BC Managed Care – PPO | Admitting: Cardiology

## 2022-02-21 DIAGNOSIS — I272 Pulmonary hypertension, unspecified: Secondary | ICD-10-CM | POA: Diagnosis not present

## 2022-02-21 DIAGNOSIS — R0683 Snoring: Secondary | ICD-10-CM

## 2022-02-23 NOTE — Procedures (Signed)
       SLEEP STUDY REPORT Patient Information Study Date: 02/21/2022 Patient Name: Jennifer [name] Patient ID: [id number] Birth Date: [birth year] Age: 31 Gender: Female BMI: 31.4 (W=145 lb, H=4' 9'') Stopbang: 4 Referring Physician: Pierre Bali, MD  TEST DESCRIPTION: Home sleep apnea testing was completed using the WatchPat, a Type 1 device, utilizing peripheral arterial tonometry (PAT), chest movement, actigraphy, pulse oximetry, pulse rate, body position and snore. AHI was calculated with apnea and hypopnea using valid sleep time as the denominator. RDI includes apneas, hypopneas, and RERAs. The data acquired and the scoring of sleep and all associated events were performed in accordance with the recommended standards and specifications as outlined in the AASM Manual for the Scoring of Sleep and Associated Events 2.2.0 (2015).   FINDINGS: 1.  No evidence of Obstructive Sleep Apnea with AHI 4.9/hr.  2.  No Central Sleep Apnea. 3.  Oxygen desaturations as low as 85%. 4.  Minimal snoring was present. O2 sats were < 88% for 4.9 minutes. 5.  Total sleep time was 6 hrs and 33 min. 6.  23.8% of total sleep time was spent in REM sleep.  7.  Shortened sleep onset latency at 5 min.  8.  Prolonged REM sleep onset latency at 148 min.  9.  Total awakenings were 6.   DIAGNOSIS:  Normal study with no significant sleep disordered breathing.  RECOMMENDATIONS:   1. Normal study with no significant sleep disordered breathing.  2.  Healthy sleep recommendations include:  adequate nightly sleep (normal 7-9 hrs/night), avoidance of caffeine after noon and alcohol near bedtime, and maintaining a sleep environment that is cool, dark and quiet.  3.  Weight loss for overweight patients is recommended.    4.  Snoring recommendations include:  weight loss where appropriate, side sleeping, and avoidance of alcohol before bed.  5.  Operation of motor vehicle or dangerous equipment must be avoided  when feeling drowsy, excessively sleepy, or mentally fatigued.    6.  An ENT consultation which may be useful for specific causes of and possible treatment of bothersome snoring.   7. Weight loss may be of benefit in reducing the severity of snoring.  Signature:   Fransico Him, MD; [facility]; [place], [place] Board of Sleep Medicine Electronically Signed: 02/23/2022

## 2022-02-24 ENCOUNTER — Other Ambulatory Visit (HOSPITAL_COMMUNITY): Payer: Self-pay

## 2022-02-24 ENCOUNTER — Telehealth: Payer: Self-pay | Admitting: *Deleted

## 2022-02-24 NOTE — Telephone Encounter (Signed)
-----   Message from Lauralee Evener, [place] sent at 02/24/2022  9:15 AM EST -----  ----- Message ----- From: Sueanne Margarita, MD Sent: 02/23/2022  12:51 PM EST To: Cv Div Sleep Studies  Please let patient know that sleep study showed no significant sleep apnea.

## 2022-02-24 NOTE — Telephone Encounter (Signed)
The patient has been notified of the result. Left detailed message on voicemail and informed patient to call back.Jennifer [name], [place]

## 2022-02-24 NOTE — Progress Notes (Signed)
Paramedicine Encounter    Patient ID: Jennifer [name], female    DOB: 1990/06/10, 31 y.o.   MRN: [id number]  Pt reports she is doing good this week. She went out of town for thanksgiving. Her cardiomems is up by 2 points but much better than the previous week.  She reports her breathing is doing good. She denies c/p, no dizziness. She does feel bloated a little bit. [name] taking 50m torsemide daily.  She had visit with GI and ENDO this week  her insulin is being increased.  She filled up pill box today, I checked it and everything was correct except she had left out a few corlanor in the evening row, so that was added in.  Looks like her sleep study did not show sleep apnea.  Her endo doc increased her insulin.  Will f/u next week.   CBG PTA-247  BP (!) 150/78   Pulse 98   Resp 18   Wt 140 lb (63.5 kg)   LMP  (LMP Unknown) Comment: neg preg test  SpO2 98%   BMI 30.30 kg/m  Weight yesterday-141 Last visit weight-150   Patient Care Team: PElinor Parkinsonas PCP - General (Physician Assistant) BJacelyn Pi MD as PCP - Endocrinology (Endocrinology)  Patient Active Problem List   Diagnosis Date Noted   Pulmonary hypertension (HC[place] 02/06/2022   NASH (nonalcoholic steatohepatitis) 02/06/2022   Obesity (BMI 30-39.9) 02/06/2022   Heart failure (HB[place] 12/03/2021   Acute on chronic systolic CHF (congestive heart failure) (HP[place] 12/02/2021   Elevated troponin 12/02/2021   Pancytopenia (HPierrepont Manor 12/02/2021   Amenorrhea 12/02/2021   Hyperosmolar hyperglycemic state (HHS) (HH[place] 08/26/2021   Small intestinal bacterial overgrowth (SIBO) 08/26/2021   Lower extremity edema 08/26/2021   Hyperkalemia 08/14/2021   Hx of insulin dependent diabetes mellitus 08/14/2021   CKD (chronic kidney disease) stage 4, GFR 15-29 ml/min (HCC) 05/13/2021   Chest pain 03/10/2021   Acute on chronic combined systolic and diastolic CHF (congestive heart failure) (HEbens[place] 02/09/2021   History of astrocytoma of  brain 02/09/2021   Severe hyperglycemia due to diabetes mellitus (HCamp[place] 01/25/2021   Diarrhea    Elevated transaminase level    Acute combined systolic and diastolic heart failure (HCC)    Nonischemic cardiomyopathy (HCC)    Acute decompensated heart failure (HCC)    Elevated liver enzymes    Acute renal failure superimposed on stage 3a chronic kidney disease (HRu[place] 11/26/2020   Abdominal pain 092/44/6286  Chronic systolic CHF (congestive heart failure) (HBabc[place] 11/23/2020   Essential hypertension 11/23/2020   Hypertriglyceridemia 11/23/2020   Type 2 diabetes mellitus with hyperlipidemia (HB[place] 10/10/2018   Hypothyroidism 10/10/2018    Current Outpatient Medications:    albuterol (VENTOLIN HFA) 108 (90 Base) MCG/ACT inhaler, Inhale 1-2 puffs into the lungs every 6 (six) hours as needed for wheezing or shortness of breath., Disp: , Rfl:    aspirin EC 81 MG EC tablet, Take 1 tablet (81 mg total) by mouth daily. Swallow whole., Disp: 30 tablet, Rfl: 1   carvedilol (COREG) 3.125 MG tablet, Take 1 tablet (3.125 mg total) by mouth 2 (two) times daily with a meal., Disp: 60 tablet, Rfl: 0   DULoxetine (CYMBALTA) 30 MG capsule, Take 30 mg by mouth at bedtime., Disp: , Rfl:    Evolocumab (REPATHA) 140 MG/ML SOSY, Inject 140 mg into the skin See admin instructions. Inject 140 mg subcutaneously every other Sunday evening, Disp: , Rfl:    fenofibrate 160 MG tablet, Take  1 tablet (160 mg total) by mouth daily., Disp: 30 tablet, Rfl: 1   ivabradine (CORLANOR) 7.5 MG TABS tablet, Take 1 tablet (7.5 mg total) by mouth 2 (two) times daily with a meal., Disp: 60 tablet, Rfl: 6   levothyroxine (SYNTHROID) 125 MCG tablet, Take 1 tablet (125 mcg total) by mouth daily at 6 (six) AM., Disp: 30 tablet, Rfl: 1   loratadine (CLARITIN) 10 MG tablet, Take 10 mg by mouth daily., Disp: , Rfl:    ondansetron (ZOFRAN) 4 MG tablet, Take 4 mg by mouth every 8 (eight) hours as needed for nausea or vomiting., Disp: , Rfl:     pantoprazole (PROTONIX) 40 MG tablet, Take 40 mg by mouth daily., Disp: , Rfl:    pregabalin (LYRICA) 150 MG capsule, Take 1 capsule (150 mg total) by mouth at bedtime. (Patient taking differently: Take 300 mg by mouth at bedtime.), Disp: , Rfl:    prochlorperazine (COMPAZINE) 5 MG tablet, Take 5 mg by mouth daily., Disp: , Rfl:    rosuvastatin (CRESTOR) 40 MG tablet, Take 40 mg by mouth daily., Disp: , Rfl:    sildenafil (REVATIO) 20 MG tablet, Take 1 tablet (20 mg total) by mouth 3 (three) times daily., Disp: 90 tablet, Rfl: 3   torsemide 40 MG TABS, Take 40 mg by mouth daily., Disp: 60 tablet, Rfl: 0   tretinoin (RETIN-A) 0.05 % cream, Apply 1 application topically at bedtime., Disp: , Rfl:    insulin aspart (NOVOLOG) 100 UNIT/ML FlexPen, Inject 60 Units into the skin 3 (three) times daily with meals., Disp: 15 mL, Rfl: 0   insulin aspart (NOVOLOG) 100 UNIT/ML FlexPen, For before meals and at bedtime Sliding scale: CBG 70 - 120: 0 units  CBG 121 - 150: 3 units  CBG 151 - 200: 4 units  CBG 201 - 250: 7 units  CBG 251 - 300: 11 units  CBG 301 - 350: 15 units  CBG 351 - 400: 20 units  CBG > 400: notify your doctor, Disp: 15 mL, Rfl: 0   insulin glargine (LANTUS) 100 UNIT/ML Solostar Pen, Inject 60 Units into the skin 2 (two) times daily., Disp: 15 mL, Rfl: 0   Insulin Pen Needle 32G X 4 MM MISC, 1 Device by Does not apply route QID. For use with insulin pens, Disp: 100 each, Rfl: 0 Allergies  Allergen Reactions   Ketamine Other (See Comments)    In vegetative state for 15 minutes per pt   Erythromycin    Fentanyl Nausea And Vomiting   Maitake Mushroom [M[place]] Itching    Itchy throat   Penicillin V Itching    Gi upset   Shellfish Allergy Other (See Comments)    Pt has never had shellfish but tested positive on allergy test. Pt states contrast in CT is okay   Morphine Itching and Rash   Penicillins Rash    Has patient had a PCN reaction causing immediate rash, facial/tongue/throat swelling,  SOB or lightheadedness with hypotension: Y Has patient had a PCN reaction causing severe rash involving mucus membranes or skin necrosis: Y Has patient had a PCN reaction that required hospitalization: N Has patient had a PCN reaction occurring within the last 10 years: Y If all of the above answers are "NO", then may proceed with Cephalosporin use.    Prednisone Rash      Social History   Socioeconomic History   Marital status: Married    Spouse name: Not on file   Number of  children: 0   Years of education: Not on file   Highest education level: Not on file  Occupational History   Occupation: [employer]  Tobacco Use   Smoking status: Never   Smokeless tobacco: Never  Vaping Use   Vaping Use: Never used  Substance and Sexual Activity   Alcohol use: Never   Drug use: Never   Sexual activity: Yes  Other Topics Concern   Not on file  Social History Narrative   Not on file   Social Determinants of Health   Financial Resource Strain: Low Risk  (12/10/2021)   Overall Financial Resource Strain (CARDIA)    Difficulty of Paying Living Expenses: Not hard at all  Food Insecurity: No Food Insecurity (02/08/2022)   Hunger Vital Sign    Worried About Running Out of Food in the Last Year: Never true    [place] in the Last Year: Never true  Transportation Needs: No Transportation Needs (02/08/2022)   PRAPARE - Hydrologist (Medical): No    Lack of Transportation (Non-Medical): No  Physical Activity: Sufficiently Active (12/10/2021)   Exercise Vital Sign    Days of Exercise per Week: 3 days    Minutes of Exercise per Session: 60 min  Stress: No Stress Concern Present (12/10/2021)   [place]    Feeling of Stress : Not at all  Social Connections: [place] (12/10/2021)   Social Connection and Isolation Panel [NHANES]    Frequency of Communication with Friends and  Family: More than three times a week    Frequency of Social Gatherings with Friends and Family: More than three times a week    Attends Religious Services: More than 4 times per year    Active Member of Clubs or Organizations: No    Attends Music therapist: More than 4 times per year    Marital Status: Married  Human resources officer Violence: Not At Risk (02/08/2022)   Humiliation, Afraid, Rape, and Kick questionnaire    Fear of Current or Ex-Partner: No    Emotionally Abused: No    Physically Abused: No    Sexually Abused: No    Physical Exam      Future Appointments  Date Time Provider [place]  03/18/2022  9:30 AM [facility] PA/NP [facility] None  04/28/2022 10:00 AM [facility] PA/NP [facility] None       [name], EMT-P-Paramedic [place] Paramedic  02/24/22

## 2022-02-25 ENCOUNTER — Ambulatory Visit: Payer: BC Managed Care – PPO | Attending: Internal Medicine

## 2022-02-25 DIAGNOSIS — G4734 Idiopathic sleep related nonobstructive alveolar hypoventilation: Secondary | ICD-10-CM

## 2022-03-03 ENCOUNTER — Telehealth (HOSPITAL_COMMUNITY): Payer: Self-pay

## 2022-03-03 ENCOUNTER — Other Ambulatory Visit (HOSPITAL_COMMUNITY): Payer: Self-pay

## 2022-03-03 NOTE — Progress Notes (Signed)
Paramedicine Encounter    Patient ID: Jennifer [name], female    DOB: 11-24-1990, 31 y.o.   MRN: [id number]  Pt reports feeling bloated today, more sob, weight up 9lbs from yesterday. She said she drank a lot more yesterday, she felt thirsty, but her CBG's were ok  Cardiomems elevated-trending up since yesterday.  She denies d/cp, no dizziness.   Will ask clinic dosage for any extra torsemide.  She has not had her meds yet this morning.  She denies missing any doses this week.   Next week I will see her on Tuesday-she is going out of town until that Monday. Will plan on for 2 wks of pill box checks.  Meds verified and pill box checked.  She has been added on vit d weekly and sodium bicarb since last visit.    CBG PTA-149  BP (!) 166/80   Pulse 95   Resp 20   Wt 149 lb (67.6 kg)   LMP  (LMP Unknown) Comment: neg preg test  SpO2 98%   BMI 32.24 kg/m  Weight yesterday-140 Last visit weight-140  Patient Care Team: Elinor Parkinson as PCP - General (Physician Assistant) Jacelyn Pi, MD as PCP - Endocrinology (Endocrinology)  Patient Active Problem List   Diagnosis Date Noted   Pulmonary hypertension ([place]) 02/06/2022   NASH (nonalcoholic steatohepatitis) 02/06/2022   Obesity (BMI 30-39.9) 02/06/2022   Heart failure ([place]) 12/03/2021   Acute on chronic systolic CHF (congestive heart failure) ([place]) 12/02/2021   Elevated troponin 12/02/2021   Pancytopenia ([place]) 12/02/2021   Amenorrhea 12/02/2021   Hyperosmolar hyperglycemic state (HHS) ([place]) 08/26/2021   Small intestinal bacterial overgrowth (SIBO) 08/26/2021   Lower extremity edema 08/26/2021   Hyperkalemia 08/14/2021   Hx of insulin dependent diabetes mellitus 08/14/2021   CKD (chronic kidney disease) stage 4, GFR 15-29 ml/min (HCC) 05/13/2021   Chest pain 03/10/2021   Acute on chronic combined systolic and diastolic CHF (congestive heart failure) ([place]) 02/09/2021   History of astrocytoma of brain 02/09/2021    Severe hyperglycemia due to diabetes mellitus ([place]) 01/25/2021   Diarrhea    Elevated transaminase level    Acute combined systolic and diastolic heart failure (HCC)    Nonischemic cardiomyopathy (HCC)    Acute decompensated heart failure (HCC)    Elevated liver enzymes    Acute renal failure superimposed on stage 3a chronic kidney disease ([place]) 11/26/2020   Abdominal pain 78/29/5621   Chronic systolic CHF (congestive heart failure) ([place]) 11/23/2020   Essential hypertension 11/23/2020   Hypertriglyceridemia 11/23/2020   Type 2 diabetes mellitus with hyperlipidemia ([place]) 10/10/2018   Hypothyroidism 10/10/2018    Current Outpatient Medications:    albuterol (VENTOLIN HFA) 108 (90 Base) MCG/ACT inhaler, Inhale 1-2 puffs into the lungs every 6 (six) hours as needed for wheezing or shortness of breath., Disp: , Rfl:    aspirin EC 81 MG EC tablet, Take 1 tablet (81 mg total) by mouth daily. Swallow whole., Disp: 30 tablet, Rfl: 1   carvedilol (COREG) 3.125 MG tablet, Take 1 tablet (3.125 mg total) by mouth 2 (two) times daily with a meal., Disp: 60 tablet, Rfl: 0   DULoxetine (CYMBALTA) 30 MG capsule, Take 30 mg by mouth at bedtime., Disp: , Rfl:    ergocalciferol (VITAMIN D2) 1.25 MG (50000 UT) capsule, Take 50,000 Units by mouth once a week., Disp: , Rfl:    Evolocumab (REPATHA) 140 MG/ML SOSY, Inject 140 mg into the skin See admin instructions. Inject 140 mg subcutaneously  every other Sunday evening, Disp: , Rfl:    fenofibrate 160 MG tablet, Take 1 tablet (160 mg total) by mouth daily., Disp: 30 tablet, Rfl: 1   insulin aspart (NOVOLOG) 100 UNIT/ML FlexPen, Inject 60 Units into the skin 3 (three) times daily with meals., Disp: 15 mL, Rfl: 0   insulin aspart (NOVOLOG) 100 UNIT/ML FlexPen, For before meals and at bedtime Sliding scale: CBG 70 - 120: 0 units  CBG 121 - 150: 3 units  CBG 151 - 200: 4 units  CBG 201 - 250: 7 units  CBG 251 - 300: 11 units  CBG 301 - 350: 15 units  CBG 351 - 400: 20  units  CBG > 400: notify your doctor, Disp: 15 mL, Rfl: 0   insulin glargine (LANTUS) 100 UNIT/ML Solostar Pen, Inject 60 Units into the skin 2 (two) times daily., Disp: 15 mL, Rfl: 0   Insulin Pen Needle 32G X 4 MM MISC, 1 Device by Does not apply route QID. For use with insulin pens, Disp: 100 each, Rfl: 0   ivabradine (CORLANOR) 7.5 MG TABS tablet, Take 1 tablet (7.5 mg total) by mouth 2 (two) times daily with a meal., Disp: 60 tablet, Rfl: 6   levothyroxine (SYNTHROID) 125 MCG tablet, Take 1 tablet (125 mcg total) by mouth daily at 6 (six) AM., Disp: 30 tablet, Rfl: 1   loratadine (CLARITIN) 10 MG tablet, Take 10 mg by mouth daily., Disp: , Rfl:    ondansetron (ZOFRAN) 4 MG tablet, Take 4 mg by mouth every 8 (eight) hours as needed for nausea or vomiting., Disp: , Rfl:    pantoprazole (PROTONIX) 40 MG tablet, Take 40 mg by mouth daily., Disp: , Rfl:    pregabalin (LYRICA) 150 MG capsule, Take 1 capsule (150 mg total) by mouth at bedtime. (Patient taking differently: Take 300 mg by mouth at bedtime.), Disp: , Rfl:    prochlorperazine (COMPAZINE) 5 MG tablet, Take 5 mg by mouth daily., Disp: , Rfl:    rosuvastatin (CRESTOR) 40 MG tablet, Take 40 mg by mouth daily., Disp: , Rfl:    sildenafil (REVATIO) 20 MG tablet, Take 1 tablet (20 mg total) by mouth 3 (three) times daily., Disp: 90 tablet, Rfl: 3   sodium bicarbonate 650 MG tablet, Take 650 mg by mouth 2 (two) times daily., Disp: , Rfl:    torsemide 40 MG TABS, Take 40 mg by mouth daily., Disp: 60 tablet, Rfl: 0   tretinoin (RETIN-A) 0.05 % cream, Apply 1 application topically at bedtime., Disp: , Rfl:  Allergies  Allergen Reactions   Ketamine Other (See Comments)    In vegetative state for 15 minutes per pt   Erythromycin    Fentanyl Nausea And Vomiting   Maitake Mushroom [Maitake] Itching    Itchy throat   Penicillin V Itching    Gi upset   Shellfish Allergy Other (See Comments)    Pt has never had shellfish but tested positive on  allergy test. Pt states contrast in CT is okay   Morphine Itching and Rash   Penicillins Rash    Has patient had a PCN reaction causing immediate rash, facial/tongue/throat swelling, SOB or lightheadedness with hypotension: Y Has patient had a PCN reaction causing severe rash involving mucus membranes or skin necrosis: Y Has patient had a PCN reaction that required hospitalization: N Has patient had a PCN reaction occurring within the last 10 years: Y If all of the above answers are "NO", then may proceed with  Cephalosporin use.    Prednisone Rash      Social History   Socioeconomic History   Marital status: Married    Spouse name: Not on file   Number of children: 0   Years of education: Not on file   Highest education level: Not on file  Occupational History   Occupation: [employer]  Tobacco Use   Smoking status: Never   Smokeless tobacco: Never  Vaping Use   Vaping Use: Never used  Substance and Sexual Activity   Alcohol use: Never   Drug use: Never   Sexual activity: Yes  Other Topics Concern   Not on file  Social History Narrative   Not on file   Social Determinants of Health   Financial Resource Strain: Low Risk  (12/10/2021)   Overall Financial Resource Strain (CARDIA)    Difficulty of Paying Living Expenses: Not hard at all  Food Insecurity: No Food Insecurity (02/08/2022)   Hunger Vital Sign    Worried About Running Out of Food in the Last Year: Never true    [place] in the Last Year: Never true  Transportation Needs: No Transportation Needs (02/08/2022)   PRAPARE - Hydrologist (Medical): No    Lack of Transportation (Non-Medical): No  Physical Activity: Sufficiently Active (12/10/2021)   Exercise Vital Sign    Days of Exercise per Week: 3 days    Minutes of Exercise per Session: 60 min  Stress: No Stress Concern Present (12/10/2021)   [place]     Feeling of Stress : Not at all  Social Connections: [place] (12/10/2021)   Social Connection and Isolation Panel [NHANES]    Frequency of Communication with Friends and Family: More than three times a week    Frequency of Social Gatherings with Friends and Family: More than three times a week    Attends Religious Services: More than 4 times per year    Active Member of Clubs or Organizations: No    Attends Music therapist: More than 4 times per year    Marital Status: Married  Human resources officer Violence: Not At Risk (02/08/2022)   Humiliation, Afraid, Rape, and Kick questionnaire    Fear of Current or Ex-Partner: No    Emotionally Abused: No    Physically Abused: No    Sexually Abused: No    Physical Exam      Future Appointments  Date Time Provider [place]  03/18/2022  9:30 AM [facility] PA/NP [facility] None  04/28/2022 10:00 AM [facility] PA/NP [facility] None       [name], EMT-P-Paramedic [place] [facility]  03/03/22

## 2022-03-03 NOTE — Telephone Encounter (Signed)
Heard back from triage and for pt to take 42m daily of torsemide.  This was relayed to pt.   Jennifer [name] EE[place]12/09/2021

## 2022-03-04 ENCOUNTER — Telehealth (HOSPITAL_COMMUNITY): Payer: Self-pay

## 2022-03-04 ENCOUNTER — Emergency Department (HOSPITAL_BASED_OUTPATIENT_CLINIC_OR_DEPARTMENT_OTHER): Payer: BC Managed Care – PPO | Admitting: Radiology

## 2022-03-04 ENCOUNTER — Emergency Department (HOSPITAL_BASED_OUTPATIENT_CLINIC_OR_DEPARTMENT_OTHER)
Admission: EM | Admit: 2022-03-04 | Discharge: 2022-03-04 | Disposition: A | Discharge status: Home / Self Care | Payer: BC Managed Care – PPO | Attending: Emergency Medicine | Admitting: Emergency Medicine

## 2022-03-04 ENCOUNTER — Encounter (HOSPITAL_BASED_OUTPATIENT_CLINIC_OR_DEPARTMENT_OTHER): Payer: Self-pay | Admitting: Emergency Medicine

## 2022-03-04 ENCOUNTER — Other Ambulatory Visit: Payer: Self-pay

## 2022-03-04 DIAGNOSIS — Z794 Long term (current) use of insulin: Secondary | ICD-10-CM | POA: Insufficient documentation

## 2022-03-04 DIAGNOSIS — Z7982 Long term (current) use of aspirin: Secondary | ICD-10-CM | POA: Insufficient documentation

## 2022-03-04 DIAGNOSIS — I509 Heart failure, unspecified: Secondary | ICD-10-CM | POA: Diagnosis present

## 2022-03-04 DIAGNOSIS — I5020 Unspecified systolic (congestive) heart failure: Secondary | ICD-10-CM

## 2022-03-04 DIAGNOSIS — E119 Type 2 diabetes mellitus without complications: Secondary | ICD-10-CM | POA: Diagnosis not present

## 2022-03-04 LAB — LIPASE, BLOOD: Lipase: 46 U/L (ref 11–51)

## 2022-03-04 LAB — COMPREHENSIVE METABOLIC PANEL
ALT: 54 U/L — ABNORMAL HIGH (ref 0–44)
AST: 52 U/L — ABNORMAL HIGH (ref 15–41)
Albumin: 4.5 g/dL (ref 3.5–5.0)
Alkaline Phosphatase: 119 U/L (ref 38–126)
Anion gap: 15 (ref 5–15)
BUN: 31 mg/dL — ABNORMAL HIGH (ref 6–20)
CO2: 24 mmol/L (ref 22–32)
Calcium: 9.1 mg/dL (ref 8.9–10.3)
Chloride: 92 mmol/L — ABNORMAL LOW (ref 98–111)
Creatinine, Ser: 1.85 mg/dL — ABNORMAL HIGH (ref 0.44–1.00)
GFR, Estimated: 37 mL/min — ABNORMAL LOW (ref >60)
Glucose, Bld: 381 mg/dL — ABNORMAL HIGH (ref 70–99)
Potassium: 4.1 mmol/L (ref 3.5–5.1)
Sodium: 131 mmol/L — ABNORMAL LOW (ref 135–145)
Total Bilirubin: 0.8 mg/dL (ref 0.3–1.2)
Total Protein: 7.8 g/dL (ref 6.5–8.1)

## 2022-03-04 LAB — CBC WITH DIFFERENTIAL/PLATELET
Abs Immature Granulocytes: 0.03 10*3/uL (ref 0.00–0.07)
Basophils Absolute: 0.1 10*3/uL (ref 0.0–0.1)
Basophils Relative: 1 %
Eosinophils Absolute: 0.1 10*3/uL (ref 0.0–0.5)
Eosinophils Relative: 2 %
HCT: 30.1 % — ABNORMAL LOW (ref 36.0–46.0)
Hemoglobin: 9.9 g/dL — ABNORMAL LOW (ref 12.0–15.0)
Immature Granulocytes: 0 %
Lymphocytes Relative: 21 %
Lymphs Abs: 1.5 10*3/uL (ref 0.7–4.0)
MCH: 28.2 pg (ref 26.0–34.0)
MCHC: 32.9 g/dL (ref 30.0–36.0)
MCV: 85.8 fL (ref 80.0–100.0)
Monocytes Absolute: 0.6 10*3/uL (ref 0.1–1.0)
Monocytes Relative: 8 %
Neutro Abs: 4.9 10*3/uL (ref 1.7–7.7)
Neutrophils Relative %: 68 %
Platelets: 160 10*3/uL (ref 150–400)
RBC: 3.51 MIL/uL — ABNORMAL LOW (ref 3.87–5.11)
RDW: 15.3 % (ref 11.5–15.5)
WBC: 7.2 10*3/uL (ref 4.0–10.5)
nRBC: 0 % (ref 0.0–0.2)

## 2022-03-04 LAB — BRAIN NATRIURETIC PEPTIDE: B Natriuretic Peptide: 170.1 pg/mL — ABNORMAL HIGH (ref 0.0–100.0)

## 2022-03-04 LAB — TROPONIN I (HIGH SENSITIVITY)
Troponin I (High Sensitivity): 16 ng/L (ref <18)
Troponin I (High Sensitivity): 13 ng/L (ref <18)

## 2022-03-04 LAB — PREGNANCY, URINE: Preg Test, Ur: NEGATIVE

## 2022-03-04 LAB — TSH: TSH: 1.548 u[IU]/mL (ref 0.350–4.500)

## 2022-03-04 MED ORDER — OXYCODONE HCL 5 MG PO TABS
5.0000 mg | ORAL_TABLET | Freq: Once | ORAL | Status: AC
  Administered 2022-03-04: 5 mg via ORAL
  Filled 2022-03-04: qty 1

## 2022-03-04 MED ORDER — FUROSEMIDE 10 MG/ML IJ SOLN
80.0000 mg | Freq: Once | INTRAMUSCULAR | Status: AC
  Administered 2022-03-04: 80 mg via INTRAVENOUS
  Filled 2022-03-04: qty 8

## 2022-03-04 MED ORDER — TORSEMIDE 20 MG PO TABS: 80.0000 mg | ORAL_TABLET | Freq: Once | ORAL | Status: DC

## 2022-03-04 NOTE — Telephone Encounter (Signed)
-----   Message from Conrad Massac, NP sent at 03/03/2022  2:06 PM EST ----- Regarding: RE: weight increase We need to increase diuretics.  She needs to increase torsemide to 40 mg daily.   [name] Clegg NP-C  2:06 PM  ----- Message ----- From: Asencion Noble, CMA Sent: 03/03/2022  10:19 AM EST To: Conrad Many, NP Subject: FW: weight increase                             ----- Message ----- From: [name], EMT-P Sent: 03/03/2022   9:55 AM EST To: [facility] Triage Pool Subject: weight increase                                Hey, her weight is up 9lbs from yesterday, cardiomems up also, it was also elevated yesterday.   Feels bloated.   [name] on torsemide 22m daily since her recent admission.  Has been doing ok on that dose, except for yesterday she did admit to drinking a lot of fluids yesterday.  No missed doses of her meds.    KMarylouise Stacks [place]12/09/2021

## 2022-03-04 NOTE — Telephone Encounter (Signed)
Attempted to reach patient and left message for her to call back.

## 2022-03-04 NOTE — ED Provider Notes (Signed)
[facility] EMERGENCY DEPT Provider Note   CSN: [id number] Arrival date & time: 03/04/22  1723     History  Chief Complaint  Patient presents with   Chest Pain    Jennifer [name] is a 31 y.o. female. Patient with a complex past medical history including chronic HFrEF (35-40%), chronic pancreatitis thought to be secondary to severe hypertriglyceridemia that due to LPL deficiency on Repatha, type 3c diabetes 2/2 her pancreatic disease, history of astrocytoma status postresection in 1997 with residual left-sided weakness and upper extremity contracture, and NAFLD.  [name] here today for chest pain and shortness of breath that are new today, also concerned that she is volume up with her chronic heart failure. Has noticed increased abdominal swelling x3 days and notes that her weight is up about ten pounds. She attributes this to excess fluid intake over the past few days. Was told by her cardiologist yesterday that due to her weight and CardioMEMS being elevated that she should double her home torsemide dose from 20 mg to 40 mg.  She elected to take 80 mg today as this was her prior effective dose. She also gave herself a dose of 80mg  of subcutaneous furosemide with a Furoscix. She feels that this has been ineffective as she has only had a little bit of urine since taking that dose about 6 hours ago.      Home Medications Prior to Admission medications   Medication Sig Start Date End Date Taking? Authorizing Provider  albuterol (VENTOLIN HFA) 108 (90 Base) MCG/ACT inhaler Inhale 1-2 puffs into the lungs every 6 (six) hours as needed for wheezing or shortness of breath. 12/31/18   [provider]  aspirin EC 81 MG EC tablet Take 1 tablet (81 mg total) by mouth daily. Swallow whole. 12/07/20   Pennie Banter, DO  carvedilol (COREG) 3.125 MG tablet Take 1 tablet (3.125 mg total) by mouth 2 (two) times daily with a meal. 02/13/22 03/15/22  [name], MD  DULoxetine  (CYMBALTA) 30 MG capsule Take 30 mg by mouth at bedtime. 01/28/19 01/27/23  [provider]  ergocalciferol (VITAMIN D2) 1.25 MG (50000 UT) capsule Take 50,000 Units by mouth once a week.    [provider]  Evolocumab (REPATHA) 140 MG/ML SOSY Inject 140 mg into the skin See admin instructions. Inject 140 mg subcutaneously every other Sunday evening    [provider]  fenofibrate 160 MG tablet Take 1 tablet (160 mg total) by mouth daily. 12/07/20   [name], DO  insulin aspart (NOVOLOG) 100 UNIT/ML FlexPen Inject 60 Units into the skin 3 (three) times daily with meals. 02/13/22   [name], MD  insulin aspart (NOVOLOG) 100 UNIT/ML FlexPen For before meals and at bedtime Sliding scale: CBG 70 - 120: 0 units  CBG 121 - 150: 3 units  CBG 151 - 200: 4 units  CBG 201 - 250: 7 units  CBG 251 - 300: 11 units  CBG 301 - 350: 15 units  CBG 351 - 400: 20 units  CBG > 400: notify your doctor 02/13/22   [name], MD  insulin glargine (LANTUS) 100 UNIT/ML Solostar Pen Inject 60 Units into the skin 2 (two) times daily. 02/13/22   [name], MD  Insulin Pen Needle 32G X 4 MM MISC 1 Device by Does not apply route QID. For use with insulin pens 02/13/22   [name], MD  ivabradine (CORLANOR) 7.5 MG TABS tablet Take  1 tablet (7.5 mg total) by mouth 2 (two) times daily with a meal. 08/02/21   [name], [name], MD  levothyroxine (SYNTHROID) 125 MCG tablet Take 1 tablet (125 mcg total) by mouth daily at 6 (six) AM. 12/07/20   Pennie Banter, DO  loratadine (CLARITIN) 10 MG tablet Take 10 mg by mouth daily.    [provider]  ondansetron (ZOFRAN) 4 MG tablet Take 4 mg by mouth every 8 (eight) hours as needed for nausea or vomiting. 12/19/18   [provider]  pantoprazole (PROTONIX) 40 MG tablet Take 40 mg by mouth daily. 12/24/19   [provider]  pregabalin (LYRICA) 150 MG capsule Take 1 capsule (150 mg total) by mouth at  bedtime. Patient taking differently: Take 300 mg by mouth at bedtime. 12/12/21   Zannie Cove, MD  prochlorperazine (COMPAZINE) 5 MG tablet Take 5 mg by mouth daily.    [provider]  rosuvastatin (CRESTOR) 40 MG tablet Take 40 mg by mouth daily. 08/09/21   [provider]  sildenafil (REVATIO) 20 MG tablet Take 1 tablet (20 mg total) by mouth 3 (three) times daily. 12/15/21   [name], [name], MD  sodium bicarbonate 650 MG tablet Take 650 mg by mouth 2 (two) times daily.    [provider]  torsemide 40 MG TABS Take 40 mg by mouth daily. 02/14/22 03/16/22  [name], MD  tretinoin (RETIN-A) 0.05 % cream Apply 1 application topically at bedtime. 01/17/21   [provider]      Allergies    Ketamine, Erythromycin, Fentanyl, Maitake mushroom [maitake], Penicillin v, Shellfish allergy, Morphine, Penicillins, and Prednisone    Review of Systems   Review of Systems  Constitutional:  Negative for chills and fever.  Cardiovascular:  Positive for chest pain.  Neurological:  Negative for dizziness and syncope.  All other systems reviewed and are negative.   Physical Exam Updated Vital Signs BP (!) 161/85   Pulse 96   Temp 98.5 F (36.9 C) (Oral)   Resp (!) 21   Wt 67.6 kg   LMP  (LMP Unknown)   SpO2 96%   BMI 32.24 kg/m  Physical Exam Constitutional:      Comments: Well appearing and NAD  Cardiovascular:     Heart sounds: Normal heart sounds. No murmur heard. Pulmonary:     Comments: Normal effort on RA. Diffuse rales Chest:     Chest wall: No mass or tenderness.  Abdominal:     Comments: Obese, without fluid wave or pitting edema to the body wall  Musculoskeletal:     Right lower leg: Right lower leg edema: trace.     Left lower leg: Left lower leg edema: trace.  Skin:    Capillary Refill: Capillary refill takes less than 2 seconds.  Neurological:     General: No focal deficit present.  Psychiatric:        Mood and Affect:  Mood normal.     ED Results / Procedures / Treatments   Labs (all labs ordered are listed, but only abnormal results are displayed) Labs Reviewed  BRAIN NATRIURETIC PEPTIDE - Abnormal; Notable for the following components:      Result Value   B Natriuretic Peptide 170.1 (*)    All other components within normal limits  CBC WITH DIFFERENTIAL/PLATELET - Abnormal; Notable for the following components:   RBC 3.51 (*)    Hemoglobin 9.9 (*)    HCT 30.1 (*)    All  other components within normal limits  COMPREHENSIVE METABOLIC PANEL - Abnormal; Notable for the following components:   Sodium 131 (*)    Chloride 92 (*)    Glucose, Bld 381 (*)    BUN 31 (*)    Creatinine, Ser 1.85 (*)    AST 52 (*)    ALT 54 (*)    GFR, Estimated 37 (*)    All other components within normal limits  PREGNANCY, URINE  TSH  LIPASE, BLOOD  TROPONIN I (HIGH SENSITIVITY)  TROPONIN I (HIGH SENSITIVITY)    EKG EKG Interpretation  Date/Time:  Friday March 04 2022 17:46:25 EST Ventricular Rate:  99 PR Interval:  120 QRS Duration: 91 QT Interval:  395 QTC Calculation: 507 R Axis:   60 Text Interpretation: Sinus rhythm Prolonged QT interval similar to prev no stemi Confirmed by Tanda Rockers ([id number]) on 03/04/2022 5:54:00 PM  Radiology DG Chest 2 View  Result Date: 03/04/2022 CLINICAL DATA:  10 pound weight gain in the last few days. Chest pain and shortness of breath. EXAM: CHEST - 2 VIEW COMPARISON:  None Available. FINDINGS: Cardiomems device is identified in the expected location of the left PA branch. There is mild cardiac enlargement, stable from prior exam. No pleural effusion identified. Pulmonary vascular congestion. No frank edema. No airspace disease. Visualized osseous structures are unremarkable. IMPRESSION: Mild cardiac enlargement and pulmonary vascular congestion. Electronically Signed   By: Signa Kell M.D.   On: 03/04/2022 18:18    Procedures Procedures    Medications Ordered in  ED Medications  oxyCODONE (Oxy IR/ROXICODONE) immediate release tablet 5 mg (5 mg Oral Given 03/04/22 1830)  furosemide (LASIX) injection 80 mg (80 mg Intravenous Given 03/04/22 1926)  oxyCODONE (Oxy IR/ROXICODONE) immediate release tablet 5 mg (5 mg Oral Given 03/04/22 2018)    ED Course/ Medical Decision Making/ A&P                           Medical Decision Making Patient here with CP and SOB concerning for CHF exacerbation, escpecially given recent weight increase at home. Thankfully is not hypoxic.  Per her report, has had a fair amount of diuretic at home with 80 mg of torsemide and 80 mg of SQ furosemide as well, however has had uncertain response.  Reassuringly her creatinine is actually better today compared to recent baseline.  Suspect that she could tolerate more diuretic.  Will dose 80 mg of IV Lasix today and monitor response.   Patient remains hemodynamically stable after administration of the IV Lasix.  Given her overall reassuring labs at this time and her normal SpO2 on room air, believe that she is stable for discharge home and very close follow-up with her cardiologist.  [name] amenable to this plan.  Will have her continue with the 40 mg of daily torsemide that were recommended by her cardiologist yesterday..  Amount and/or Complexity of Data Reviewed Labs: ordered.    Details: Labs reviewed and significant for Cr 1.85, which is actually improved compared to her recent baseline which appears to be 2.3-2.7.  BNP is somewhat elevated at 170.1 up from 68 2 weeks ago.  Troponin reassuringly normal at 16.  Radiology: ordered.    Details: CXR without frank edema, though does appear a bit more congested compared to prior studies.       Final Clinical Impression(s) / ED Diagnoses Final diagnoses:  Heart failure with reduced ejection fraction (HCC)    Rx /  DC Orders ED Discharge Orders     None      Dorothyann Gibbs, MD    Alicia Amel, MD 03/04/22 2052    Sloan Leiter, DO 03/05/22 2027

## 2022-03-04 NOTE — Discharge Instructions (Addendum)
Ms. Cavness,  I think that your symptoms at this time are due to your heart failure but I do not think that we need to admit you as your O2 levels are within normal limits on room air. Your EKG and troponin were within normal limits so we do not think that you are having an ischemic event at this time. For this weekend, I want you to take the 40 mg of torsemide dose that was recommended by your cardiologist.  Your kidney function is better today than it appears to have been in quite some time which is reassuring.  I do think that you should see them as soon as possible.  Call their office early next week if you can.  Obviously, if you are shortness of breath gets worse or you develop new symptoms, please do come back to see [place].  Pearla Dubonnet, MD

## 2022-03-04 NOTE — ED Triage Notes (Signed)
Wt gain + 10lbs in the last few days. Increased diuretic per cardiology without improvement.  Some chest pain and sob Abdomen appears distended

## 2022-03-07 ENCOUNTER — Telehealth (HOSPITAL_COMMUNITY): Payer: Self-pay

## 2022-03-07 NOTE — Telephone Encounter (Signed)
I spoke to patient and she understood medication changes.

## 2022-03-07 NOTE — Telephone Encounter (Signed)
-----   Message from Conrad [place], NP sent at 03/03/2022  2:06 PM EST ----- Regarding: RE: weight increase We need to increase diuretics.  She needs to increase torsemide to 40 mg daily.   [name] Clegg NP-C  2:06 PM  ----- Message ----- From: Asencion Noble, CMA Sent: 03/03/2022  10:19 AM EST To: Conrad [place], NP Subject: FW: weight increase                             ----- Message ----- From: [name], EMT-P Sent: 03/03/2022   9:55 AM EST To: [facility] Triage Pool Subject: weight increase                                Hey, her weight is up 9lbs from yesterday, cardiomems up also, it was also elevated yesterday.   Feels bloated.   [name] on torsemide 59m daily since her recent admission.  Has been doing ok on that dose, except for yesterday she did admit to drinking a lot of fluids yesterday.  No missed doses of her meds.    KMarylouise Stacks EHar[place]12/09/2021

## 2022-03-09 ENCOUNTER — Telehealth (HOSPITAL_COMMUNITY): Payer: Self-pay

## 2022-03-09 NOTE — Telephone Encounter (Signed)
Spoke to Jennifer [name] and made her aware that Jennifer [name] is out today and that she will follow up tomorrow. [name] agreeable and has no needs at this time. Call complete.   Jennifer [name], [place] 03/09/2022

## 2022-03-10 ENCOUNTER — Telehealth (HOSPITAL_COMMUNITY): Payer: Self-pay

## 2022-03-10 ENCOUNTER — Telehealth (HOSPITAL_COMMUNITY): Payer: Self-pay | Admitting: Adult Health

## 2022-03-10 NOTE — Telephone Encounter (Signed)
  Cardiomems Remote Monitoring  S/P Cardiomems Implant   PAD Goal: 17 Most recent reading: 28 suggestive of fluid accumulation   Recommended changes: Please call. Instruct to increase torsemide 80 mg for 2 days then cut back to 40 mg daily.   I continue to review and analyze the patients PA pressures weekly (and more often as needed) to bring PA pressures within the optimal range.       Fionna Merriott NP-C  12:04 PM

## 2022-03-14 ENCOUNTER — Other Ambulatory Visit: Payer: Self-pay

## 2022-03-14 ENCOUNTER — Encounter (HOSPITAL_COMMUNITY): Payer: Self-pay

## 2022-03-14 ENCOUNTER — Emergency Department (HOSPITAL_BASED_OUTPATIENT_CLINIC_OR_DEPARTMENT_OTHER): Payer: BC Managed Care – PPO

## 2022-03-14 ENCOUNTER — Encounter (HOSPITAL_BASED_OUTPATIENT_CLINIC_OR_DEPARTMENT_OTHER): Payer: Self-pay | Admitting: *Deleted

## 2022-03-14 ENCOUNTER — Inpatient Hospital Stay (HOSPITAL_BASED_OUTPATIENT_CLINIC_OR_DEPARTMENT_OTHER)
Admission: EM | Admit: 2022-03-14 | Discharge: 2022-03-18 | DRG: 177 | Disposition: A | Discharge status: Home / Self Care | Payer: BC Managed Care – PPO | Attending: Family Medicine | Admitting: Family Medicine

## 2022-03-14 DIAGNOSIS — J1282 Pneumonia due to coronavirus disease 2019: Secondary | ICD-10-CM | POA: Diagnosis present

## 2022-03-14 DIAGNOSIS — I10 Essential (primary) hypertension: Secondary | ICD-10-CM | POA: Diagnosis not present

## 2022-03-14 DIAGNOSIS — K760 Fatty (change of) liver, not elsewhere classified: Secondary | ICD-10-CM | POA: Diagnosis present

## 2022-03-14 DIAGNOSIS — Z880 Allergy status to penicillin: Secondary | ICD-10-CM

## 2022-03-14 DIAGNOSIS — U071 COVID-19: Principal | ICD-10-CM | POA: Diagnosis present

## 2022-03-14 DIAGNOSIS — Z8349 Family history of other endocrine, nutritional and metabolic diseases: Secondary | ICD-10-CM

## 2022-03-14 DIAGNOSIS — Z91018 Allergy to other foods: Secondary | ICD-10-CM

## 2022-03-14 DIAGNOSIS — Z79899 Other long term (current) drug therapy: Secondary | ICD-10-CM

## 2022-03-14 DIAGNOSIS — R109 Unspecified abdominal pain: Secondary | ICD-10-CM | POA: Diagnosis present

## 2022-03-14 DIAGNOSIS — E039 Hypothyroidism, unspecified: Secondary | ICD-10-CM | POA: Diagnosis present

## 2022-03-14 DIAGNOSIS — Z7982 Long term (current) use of aspirin: Secondary | ICD-10-CM

## 2022-03-14 DIAGNOSIS — R000 Tachycardia, unspecified: Secondary | ICD-10-CM

## 2022-03-14 DIAGNOSIS — Z794 Long term (current) use of insulin: Secondary | ICD-10-CM

## 2022-03-14 DIAGNOSIS — I2510 Atherosclerotic heart disease of native coronary artery without angina pectoris: Secondary | ICD-10-CM | POA: Diagnosis present

## 2022-03-14 DIAGNOSIS — Z85841 Personal history of malignant neoplasm of brain: Secondary | ICD-10-CM

## 2022-03-14 DIAGNOSIS — Z91013 Allergy to seafood: Secondary | ICD-10-CM

## 2022-03-14 DIAGNOSIS — K219 Gastro-esophageal reflux disease without esophagitis: Secondary | ICD-10-CM | POA: Diagnosis present

## 2022-03-14 DIAGNOSIS — Z833 Family history of diabetes mellitus: Secondary | ICD-10-CM

## 2022-03-14 DIAGNOSIS — Z888 Allergy status to other drugs, medicaments and biological substances status: Secondary | ICD-10-CM

## 2022-03-14 DIAGNOSIS — E785 Hyperlipidemia, unspecified: Secondary | ICD-10-CM | POA: Diagnosis present

## 2022-03-14 DIAGNOSIS — E669 Obesity, unspecified: Secondary | ICD-10-CM | POA: Diagnosis present

## 2022-03-14 DIAGNOSIS — E1122 Type 2 diabetes mellitus with diabetic chronic kidney disease: Secondary | ICD-10-CM | POA: Diagnosis present

## 2022-03-14 DIAGNOSIS — I428 Other cardiomyopathies: Secondary | ICD-10-CM

## 2022-03-14 DIAGNOSIS — E871 Hypo-osmolality and hyponatremia: Secondary | ICD-10-CM | POA: Diagnosis present

## 2022-03-14 DIAGNOSIS — R112 Nausea with vomiting, unspecified: Secondary | ICD-10-CM | POA: Diagnosis present

## 2022-03-14 DIAGNOSIS — Z800 Family history of malignant neoplasm of digestive organs: Secondary | ICD-10-CM

## 2022-03-14 DIAGNOSIS — Z885 Allergy status to narcotic agent status: Secondary | ICD-10-CM

## 2022-03-14 DIAGNOSIS — E282 Polycystic ovarian syndrome: Secondary | ICD-10-CM | POA: Diagnosis present

## 2022-03-14 DIAGNOSIS — E872 Acidosis, unspecified: Secondary | ICD-10-CM | POA: Diagnosis not present

## 2022-03-14 DIAGNOSIS — Z8249 Family history of ischemic heart disease and other diseases of the circulatory system: Secondary | ICD-10-CM

## 2022-03-14 DIAGNOSIS — N1832 Chronic kidney disease, stage 3b: Secondary | ICD-10-CM | POA: Diagnosis present

## 2022-03-14 DIAGNOSIS — Z881 Allergy status to other antibiotic agents status: Secondary | ICD-10-CM

## 2022-03-14 DIAGNOSIS — Z6831 Body mass index (BMI) 31.0-31.9, adult: Secondary | ICD-10-CM

## 2022-03-14 DIAGNOSIS — N184 Chronic kidney disease, stage 4 (severe): Secondary | ICD-10-CM | POA: Diagnosis present

## 2022-03-14 DIAGNOSIS — J9601 Acute respiratory failure with hypoxia: Secondary | ICD-10-CM | POA: Diagnosis present

## 2022-03-14 DIAGNOSIS — N179 Acute kidney failure, unspecified: Secondary | ICD-10-CM | POA: Diagnosis present

## 2022-03-14 DIAGNOSIS — I129 Hypertensive chronic kidney disease with stage 1 through stage 4 chronic kidney disease, or unspecified chronic kidney disease: Secondary | ICD-10-CM | POA: Diagnosis present

## 2022-03-14 DIAGNOSIS — E1169 Type 2 diabetes mellitus with other specified complication: Secondary | ICD-10-CM | POA: Diagnosis present

## 2022-03-14 DIAGNOSIS — Z79890 Hormone replacement therapy (postmenopausal): Secondary | ICD-10-CM

## 2022-03-14 DIAGNOSIS — R7989 Other specified abnormal findings of blood chemistry: Secondary | ICD-10-CM | POA: Diagnosis present

## 2022-03-14 LAB — CBC WITH DIFFERENTIAL/PLATELET
Abs Immature Granulocytes: 0.04 10*3/uL (ref 0.00–0.07)
Basophils Absolute: 0.1 10*3/uL (ref 0.0–0.1)
Basophils Relative: 1 %
Eosinophils Absolute: 0.1 10*3/uL (ref 0.0–0.5)
Eosinophils Relative: 1 %
HCT: 33.6 % — ABNORMAL LOW (ref 36.0–46.0)
Hemoglobin: 11.6 g/dL — ABNORMAL LOW (ref 12.0–15.0)
Immature Granulocytes: 1 %
Lymphocytes Relative: 8 %
Lymphs Abs: 0.7 10*3/uL (ref 0.7–4.0)
MCH: 29.4 pg (ref 26.0–34.0)
MCHC: 34.5 g/dL (ref 30.0–36.0)
MCV: 85.3 fL (ref 80.0–100.0)
Monocytes Absolute: 0.7 10*3/uL (ref 0.1–1.0)
Monocytes Relative: 8 %
Neutro Abs: 7 10*3/uL (ref 1.7–7.7)
Neutrophils Relative %: 81 %
Platelets: 207 10*3/uL (ref 150–400)
RBC: 3.94 MIL/uL (ref 3.87–5.11)
RDW: 15.4 % (ref 11.5–15.5)
WBC: 8.6 10*3/uL (ref 4.0–10.5)
nRBC: 0 % (ref 0.0–0.2)

## 2022-03-14 LAB — LACTIC ACID, PLASMA
Lactic Acid, Venous: 2.4 mmol/L (ref 0.5–1.9)
Lactic Acid, Venous: 1.4 mmol/L (ref 0.5–1.9)

## 2022-03-14 LAB — HEPATIC FUNCTION PANEL
ALT: 46 U/L — ABNORMAL HIGH (ref 0–44)
ALT: 50 U/L — ABNORMAL HIGH (ref 0–44)
AST: 52 U/L — ABNORMAL HIGH (ref 15–41)
AST: 65 U/L — ABNORMAL HIGH (ref 15–41)
Albumin: 4.1 g/dL (ref 3.5–5.0)
Albumin: 3.6 g/dL (ref 3.5–5.0)
Alkaline Phosphatase: 108 U/L (ref 38–126)
Alkaline Phosphatase: 110 U/L (ref 38–126)
Bilirubin, Direct: 0.1 mg/dL (ref 0.0–0.2)
Bilirubin, Direct: 0.2 mg/dL (ref 0.0–0.2)
Indirect Bilirubin: 0.5 mg/dL (ref 0.3–0.9)
Indirect Bilirubin: 0.5 mg/dL (ref 0.3–0.9)
Total Bilirubin: 0.6 mg/dL (ref 0.3–1.2)
Total Bilirubin: 0.7 mg/dL (ref 0.3–1.2)
Total Protein: 7.1 g/dL (ref 6.5–8.1)
Total Protein: 7 g/dL (ref 6.5–8.1)

## 2022-03-14 LAB — BASIC METABOLIC PANEL
Anion gap: 16 — ABNORMAL HIGH (ref 5–15)
BUN: 49 mg/dL — ABNORMAL HIGH (ref 6–20)
CO2: 19 mmol/L — ABNORMAL LOW (ref 22–32)
Calcium: 9.6 mg/dL (ref 8.9–10.3)
Chloride: 91 mmol/L — ABNORMAL LOW (ref 98–111)
Creatinine, Ser: 1.87 mg/dL — ABNORMAL HIGH (ref 0.44–1.00)
GFR, Estimated: 37 mL/min — ABNORMAL LOW (ref >60)
Glucose, Bld: 162 mg/dL — ABNORMAL HIGH (ref 70–99)
Potassium: 3.8 mmol/L (ref 3.5–5.1)
Sodium: 126 mmol/L — ABNORMAL LOW (ref 135–145)

## 2022-03-14 LAB — GLUCOSE, CAPILLARY: Glucose-Capillary: 192 mg/dL — ABNORMAL HIGH (ref 70–99)

## 2022-03-14 LAB — CBG MONITORING, ED
Glucose-Capillary: 51 mg/dL — ABNORMAL LOW (ref 70–99)
Glucose-Capillary: 86 mg/dL (ref 70–99)
Glucose-Capillary: 124 mg/dL — ABNORMAL HIGH (ref 70–99)
Glucose-Capillary: 122 mg/dL — ABNORMAL HIGH (ref 70–99)

## 2022-03-14 LAB — RESP PANEL BY RT-PCR (RSV, FLU A&B, COVID)  RVPGX2
Influenza A by PCR: NEGATIVE
Influenza B by PCR: NEGATIVE
Resp Syncytial Virus by PCR: NEGATIVE
SARS Coronavirus 2 by RT PCR: POSITIVE — AB

## 2022-03-14 LAB — LACTATE DEHYDROGENASE: LDH: 185 U/L (ref 98–192)

## 2022-03-14 LAB — GROUP A STREP BY PCR: Group A Strep by PCR: NOT DETECTED

## 2022-03-14 LAB — D-DIMER, QUANTITATIVE: D-Dimer, Quant: 0.83 ug/mL-FEU — ABNORMAL HIGH (ref 0.00–0.50)

## 2022-03-14 LAB — SEDIMENTATION RATE: Sed Rate: 83 mm/hr — ABNORMAL HIGH (ref 0–22)

## 2022-03-14 LAB — C-REACTIVE PROTEIN: CRP: 7.1 mg/dL — ABNORMAL HIGH (ref <1.0)

## 2022-03-14 LAB — LIPASE, BLOOD: Lipase: 23 U/L (ref 11–51)

## 2022-03-14 MED ORDER — DEXAMETHASONE SODIUM PHOSPHATE 10 MG/ML IJ SOLN
6.0000 mg | INTRAMUSCULAR | Status: DC
  Administered 2022-03-15 – 2022-03-17 (×3): 6 mg via INTRAVENOUS
  Filled 2022-03-14 (×3): qty 1

## 2022-03-14 MED ORDER — METOPROLOL TARTRATE 5 MG/5ML IV SOLN
5.0000 mg | Freq: Once | INTRAVENOUS | Status: AC
  Administered 2022-03-14: 5 mg via INTRAVENOUS
  Filled 2022-03-14: qty 5

## 2022-03-14 MED ORDER — HYDROMORPHONE HCL 1 MG/ML PO LIQD
1.0000 mg | ORAL | Status: DC | PRN
  Administered 2022-03-15 – 2022-03-16 (×2): 1 mg via ORAL
  Filled 2022-03-14 (×2): qty 1

## 2022-03-14 MED ORDER — HYDROMORPHONE HCL 1 MG/ML IJ SOLN
1.0000 mg | Freq: Once | INTRAMUSCULAR | Status: AC
  Administered 2022-03-14: 1 mg via INTRAVENOUS
  Filled 2022-03-14: qty 1

## 2022-03-14 MED ORDER — DIPHENHYDRAMINE HCL 25 MG PO CAPS: 25.0000 mg | ORAL_CAPSULE | Freq: Three times a day (TID) | ORAL | Status: DC | PRN

## 2022-03-14 MED ORDER — ROSUVASTATIN CALCIUM 20 MG PO TABS
40.0000 mg | ORAL_TABLET | Freq: Every day | ORAL | Status: DC
  Administered 2022-03-15 – 2022-03-18 (×4): 40 mg via ORAL
  Filled 2022-03-14 (×5): qty 2

## 2022-03-14 MED ORDER — LEVOTHYROXINE SODIUM 25 MCG PO TABS
125.0000 ug | ORAL_TABLET | Freq: Every day | ORAL | Status: DC
  Administered 2022-03-14 – 2022-03-18 (×4): 125 ug via ORAL
  Filled 2022-03-14 (×5): qty 1

## 2022-03-14 MED ORDER — SODIUM CHLORIDE 0.9 % IV BOLUS
1000.0000 mL | Freq: Once | INTRAVENOUS | Status: AC
  Administered 2022-03-14: 1000 mL via INTRAVENOUS

## 2022-03-14 MED ORDER — INSULIN ASPART 100 UNIT/ML IJ SOLN
0.0000 [IU] | Freq: Three times a day (TID) | INTRAMUSCULAR | Status: DC
  Administered 2022-03-15: 15 [IU] via SUBCUTANEOUS
  Administered 2022-03-15: 11 [IU] via SUBCUTANEOUS

## 2022-03-14 MED ORDER — DIPHENHYDRAMINE HCL 50 MG/ML IJ SOLN
25.0000 mg | Freq: Four times a day (QID) | INTRAMUSCULAR | Status: AC | PRN
  Administered 2022-03-15 – 2022-03-17 (×2): 25 mg via INTRAVENOUS
  Filled 2022-03-14: qty 1
  Filled 2022-03-14: qty 0.5
  Filled 2022-03-14: qty 1

## 2022-03-14 MED ORDER — ACETAMINOPHEN 325 MG PO TABS: 650.0000 mg | ORAL_TABLET | Freq: Four times a day (QID) | ORAL | Status: DC | PRN

## 2022-03-14 MED ORDER — TORSEMIDE 20 MG PO TABS: 40.0000 mg | ORAL_TABLET | Freq: Every day | ORAL | Status: DC

## 2022-03-14 MED ORDER — SODIUM CHLORIDE 0.9 % IV BOLUS
500.0000 mL | Freq: Once | INTRAVENOUS | Status: AC
  Administered 2022-03-14: 500 mL via INTRAVENOUS

## 2022-03-14 MED ORDER — SODIUM CHLORIDE 0.9 % IV SOLN
200.0000 mg | Freq: Once | INTRAVENOUS | Status: AC
  Administered 2022-03-15: 200 mg via INTRAVENOUS
  Filled 2022-03-14: qty 40

## 2022-03-14 MED ORDER — HYDROCOD POLI-CHLORPHE POLI ER 10-8 MG/5ML PO SUER
5.0000 mL | Freq: Two times a day (BID) | ORAL | Status: DC | PRN
  Administered 2022-03-18: 5 mL via ORAL
  Filled 2022-03-14: qty 5

## 2022-03-14 MED ORDER — DEXAMETHASONE 6 MG PO TABS
6.0000 mg | ORAL_TABLET | Freq: Every day | ORAL | Status: DC
  Filled 2022-03-14: qty 1

## 2022-03-14 MED ORDER — DULOXETINE HCL 30 MG PO CPEP
30.0000 mg | ORAL_CAPSULE | Freq: Every day | ORAL | Status: DC
  Administered 2022-03-14 – 2022-03-17 (×4): 30 mg via ORAL
  Filled 2022-03-14 (×4): qty 1

## 2022-03-14 MED ORDER — INSULIN ASPART 100 UNIT/ML IJ SOLN: 0.0000 [IU] | Freq: Every day | INTRAMUSCULAR | Status: DC

## 2022-03-14 MED ORDER — GUAIFENESIN-DM 100-10 MG/5ML PO SYRP
10.0000 mL | ORAL_SOLUTION | ORAL | Status: DC | PRN
  Administered 2022-03-18 (×2): 10 mL via ORAL
  Filled 2022-03-14 (×2): qty 10

## 2022-03-14 MED ORDER — SODIUM CHLORIDE 0.9 % IV SOLN
100.0000 mg | Freq: Every day | INTRAVENOUS | Status: AC
  Administered 2022-03-15 – 2022-03-16 (×2): 100 mg via INTRAVENOUS
  Filled 2022-03-14 (×2): qty 20

## 2022-03-14 MED ORDER — ALBUTEROL SULFATE HFA 108 (90 BASE) MCG/ACT IN AERS
INHALATION_SPRAY | RESPIRATORY_TRACT | Status: AC
  Administered 2022-03-14: 2 via RESPIRATORY_TRACT
  Filled 2022-03-14: qty 6.7

## 2022-03-14 MED ORDER — LOPERAMIDE HCL 2 MG PO CAPS
4.0000 mg | ORAL_CAPSULE | Freq: Once | ORAL | Status: AC
  Administered 2022-03-14: 4 mg via ORAL
  Filled 2022-03-14: qty 2

## 2022-03-14 MED ORDER — SENNA 8.6 MG PO TABS: 1.0000 | ORAL_TABLET | Freq: Every day | ORAL | Status: DC | PRN

## 2022-03-14 MED ORDER — HYDROMORPHONE HCL 1 MG/ML IJ SOLN
1.0000 mg | INTRAMUSCULAR | Status: AC | PRN
  Administered 2022-03-14 (×3): 1 mg via INTRAVENOUS
  Filled 2022-03-14 (×3): qty 1

## 2022-03-14 MED ORDER — CARVEDILOL 3.125 MG PO TABS
3.1250 mg | ORAL_TABLET | Freq: Two times a day (BID) | ORAL | Status: DC
  Administered 2022-03-14 (×2): 3.125 mg via ORAL
  Filled 2022-03-14 (×2): qty 1

## 2022-03-14 MED ORDER — IOHEXOL 300 MG/ML  SOLN
100.0000 mL | Freq: Once | INTRAMUSCULAR | Status: AC | PRN
  Administered 2022-03-14: 80 mL via INTRAVENOUS

## 2022-03-14 MED ORDER — ASPIRIN 81 MG PO TBEC
81.0000 mg | DELAYED_RELEASE_TABLET | Freq: Every day | ORAL | Status: DC
  Administered 2022-03-14 – 2022-03-18 (×5): 81 mg via ORAL
  Filled 2022-03-14 (×5): qty 1

## 2022-03-14 MED ORDER — SODIUM BICARBONATE 650 MG PO TABS
650.0000 mg | ORAL_TABLET | Freq: Two times a day (BID) | ORAL | Status: DC
  Administered 2022-03-14 – 2022-03-18 (×9): 650 mg via ORAL
  Filled 2022-03-14 (×9): qty 1

## 2022-03-14 MED ORDER — ONDANSETRON HCL 4 MG/2ML IJ SOLN
4.0000 mg | Freq: Once | INTRAMUSCULAR | Status: AC
  Administered 2022-03-14: 4 mg via INTRAVENOUS
  Filled 2022-03-14: qty 2

## 2022-03-14 MED ORDER — ALBUTEROL SULFATE (2.5 MG/3ML) 0.083% IN NEBU
INHALATION_SOLUTION | RESPIRATORY_TRACT | Status: AC
  Administered 2022-03-14: 2.5 mg via RESPIRATORY_TRACT
  Filled 2022-03-14: qty 3

## 2022-03-14 MED ORDER — PREGABALIN 75 MG PO CAPS
150.0000 mg | ORAL_CAPSULE | Freq: Every day | ORAL | Status: DC
  Administered 2022-03-14 – 2022-03-17 (×4): 150 mg via ORAL
  Filled 2022-03-14 (×4): qty 2

## 2022-03-14 MED ORDER — ALBUTEROL SULFATE HFA 108 (90 BASE) MCG/ACT IN AERS
2.0000 | INHALATION_SPRAY | RESPIRATORY_TRACT | Status: DC | PRN
  Administered 2022-03-15 – 2022-03-16 (×2): 2 via RESPIRATORY_TRACT
  Filled 2022-03-14: qty 6.7

## 2022-03-14 MED ORDER — INSULIN ASPART 100 UNIT/ML IJ SOLN
0.0000 [IU] | Freq: Three times a day (TID) | INTRAMUSCULAR | Status: DC
  Administered 2022-03-14: 2 [IU] via SUBCUTANEOUS

## 2022-03-14 MED ORDER — KETOROLAC TROMETHAMINE 30 MG/ML IJ SOLN
30.0000 mg | Freq: Once | INTRAMUSCULAR | Status: AC
  Administered 2022-03-14: 30 mg via INTRAVENOUS
  Filled 2022-03-14: qty 1

## 2022-03-14 MED ORDER — ALBUTEROL SULFATE (2.5 MG/3ML) 0.083% IN NEBU: 2.5000 mg | INHALATION_SOLUTION | Freq: Once | RESPIRATORY_TRACT | Status: AC

## 2022-03-14 MED ORDER — DIPHENHYDRAMINE HCL 50 MG/ML IJ SOLN
12.5000 mg | Freq: Once | INTRAMUSCULAR | Status: AC
  Administered 2022-03-14: 12.5 mg via INTRAVENOUS
  Filled 2022-03-14: qty 1

## 2022-03-14 MED ORDER — OXYCODONE HCL 5 MG PO TABS
5.0000 mg | ORAL_TABLET | ORAL | Status: DC | PRN
  Administered 2022-03-15 – 2022-03-17 (×4): 10 mg via ORAL
  Administered 2022-03-17: 5 mg via ORAL
  Filled 2022-03-14 (×2): qty 2
  Filled 2022-03-14: qty 1
  Filled 2022-03-14 (×3): qty 2

## 2022-03-14 MED ORDER — OXYCODONE HCL 5 MG PO TABS: 5.0000 mg | ORAL_TABLET | ORAL | Status: DC | PRN

## 2022-03-14 MED ORDER — SODIUM CHLORIDE 0.9% FLUSH
3.0000 mL | Freq: Two times a day (BID) | INTRAVENOUS | Status: DC
  Administered 2022-03-15 – 2022-03-18 (×7): 3 mL via INTRAVENOUS

## 2022-03-14 MED ORDER — IVABRADINE HCL 7.5 MG PO TABS
7.5000 mg | ORAL_TABLET | Freq: Two times a day (BID) | ORAL | Status: DC
  Administered 2022-03-15 – 2022-03-17 (×6): 7.5 mg via ORAL
  Filled 2022-03-14 (×7): qty 1

## 2022-03-14 MED ORDER — HEPARIN SODIUM (PORCINE) 5000 UNIT/ML IJ SOLN
5000.0000 [IU] | Freq: Three times a day (TID) | INTRAMUSCULAR | Status: DC
  Administered 2022-03-15 – 2022-03-18 (×10): 5000 [IU] via SUBCUTANEOUS
  Filled 2022-03-14 (×11): qty 1

## 2022-03-14 NOTE — ED Notes (Signed)
Dr. Stark Jock aware of lactic of 2.4

## 2022-03-14 NOTE — ED Notes (Signed)
RT note: Pt. placed on 2lpm n/c at this time to keep sats >92%.

## 2022-03-14 NOTE — ED Notes (Signed)
RT note: Pt. requested to be seen again after given Albuterol Inhaler with Spacer due to wheezing more along with RN/staff noting Oxygen sats dropping again as previous noted with first visit in which this RT changed out pulse ox probe which initially reading >'d to mid to high 90's. This RT in to see pt. again and placed on 2 lpm n/c with sats increasing to mid to high 90's, (97%) so will keep on 2 lpm n/c and give pt. 2.5 mg Albuterol nebulizer.

## 2022-03-14 NOTE — ED Notes (Signed)
RT note: In to see pt. due to stating that they were becoming somewhat more short of breath. Pt. assessed per RT Protocol and given 2 Puffs Albuterol per home Medication listing, Spacer used, tolerated well, RN made aware.

## 2022-03-14 NOTE — ED Notes (Signed)
Given crackers and juice

## 2022-03-14 NOTE — ED Triage Notes (Addendum)
Pt is here for URI with cough, congestion, headaches, ear ache, sore throat (increased with swallowing) and body aches.  She was around someone that is sick.  Pt also reports sob, spo2 98% in triage

## 2022-03-14 NOTE — ED Provider Notes (Signed)
[facility] EMERGENCY DEPT Provider Note   CSN: [id number] Arrival date & time: 03/14/22  0222     History  Chief Complaint  Patient presents with   URI    Jennifer [name] is a 31 y.o. female.  Patient is a 31 year old female with extensive past medical history including diabetes, brain tumor status postchemotherapy, CHF/cardiomyopathy secondary to chemotherapy, hypertension, GERD.  Patient presenting today with complaints of cough, congestion, headache, earache, sore throat, and bodyaches.  This has been ongoing for the past 2 days.  There are no aggravating or alleviating factors.  The history is provided by the patient.       Home Medications Prior to Admission medications   Medication Sig Start Date End Date Taking? Authorizing Provider  albuterol (VENTOLIN HFA) 108 (90 Base) MCG/ACT inhaler Inhale 1-2 puffs into the lungs every 6 (six) hours as needed for wheezing or shortness of breath. 12/31/18   [provider]  aspirin EC 81 MG EC tablet Take 1 tablet (81 mg total) by mouth daily. Swallow whole. 12/07/20   Ezekiel Slocumb, DO  carvedilol (COREG) 3.125 MG tablet Take 1 tablet (3.125 mg total) by mouth 2 (two) times daily with a meal. 02/13/22 03/15/22  Donne Hazel, MD  DULoxetine (CYMBALTA) 30 MG capsule Take 30 mg by mouth at bedtime. 01/28/19 01/27/23  [provider]  ergocalciferol (VITAMIN D2) 1.25 MG (50000 UT) capsule Take 50,000 Units by mouth once a week.    [provider]  Evolocumab (REPATHA) 140 MG/ML SOSY Inject 140 mg into the skin See admin instructions. Inject 140 mg subcutaneously every other Sunday evening    [provider]  fenofibrate 160 MG tablet Take 1 tablet (160 mg total) by mouth daily. 12/07/20   Nicole Kindred A, DO  insulin aspart (NOVOLOG) 100 UNIT/ML FlexPen Inject 60 Units into the skin 3 (three) times daily with meals. 02/13/22   Donne Hazel, MD  insulin aspart (NOVOLOG) 100  UNIT/ML FlexPen For before meals and at bedtime Sliding scale: CBG 70 - 120: 0 units  CBG 121 - 150: 3 units  CBG 151 - 200: 4 units  CBG 201 - 250: 7 units  CBG 251 - 300: 11 units  CBG 301 - 350: 15 units  CBG 351 - 400: 20 units  CBG > 400: notify your doctor 02/13/22   Donne Hazel, MD  insulin glargine (LANTUS) 100 UNIT/ML Solostar Pen Inject 60 Units into the skin 2 (two) times daily. 02/13/22   Donne Hazel, MD  Insulin Pen Needle 32G X 4 MM MISC 1 Device by Does not apply route QID. For use with insulin pens 02/13/22   Donne Hazel, MD  ivabradine (CORLANOR) 7.5 MG TABS tablet Take 1 tablet (7.5 mg total) by mouth 2 (two) times daily with a meal. 08/02/21   [name], Shaune Pascal, MD  levothyroxine (SYNTHROID) 125 MCG tablet Take 1 tablet (125 mcg total) by mouth daily at 6 (six) AM. 12/07/20   Ezekiel Slocumb, DO  loratadine (CLARITIN) 10 MG tablet Take 10 mg by mouth daily.    [provider]  ondansetron (ZOFRAN) 4 MG tablet Take 4 mg by mouth every 8 (eight) hours as needed for nausea or vomiting. 12/19/18   [provider]  pantoprazole (PROTONIX) 40 MG tablet Take 40 mg by mouth daily. 12/24/19   [provider]  pregabalin (LYRICA) 150 MG capsule Take 1 capsule (150 mg total) by mouth at bedtime. Patient  taking differently: Take 300 mg by mouth at bedtime. 12/12/21   Domenic Polite, MD  prochlorperazine (COMPAZINE) 5 MG tablet Take 5 mg by mouth daily.    [provider]  rosuvastatin (CRESTOR) 40 MG tablet Take 40 mg by mouth daily. 08/09/21   [provider]  sildenafil (REVATIO) 20 MG tablet Take 1 tablet (20 mg total) by mouth 3 (three) times daily. 12/15/21   [name], Shaune Pascal, MD  sodium bicarbonate 650 MG tablet Take 650 mg by mouth 2 (two) times daily.    [provider]  torsemide 40 MG TABS Take 40 mg by mouth daily. 02/14/22 03/16/22  Donne Hazel, MD  tretinoin (RETIN-A) 0.05 % cream Apply 1 application  topically at bedtime. 01/17/21   [provider]      Allergies    Ketamine, Erythromycin, Fentanyl, [place] mushroom [maitake], Penicillin v, Shellfish allergy, Morphine, Penicillins, and Prednisone    Review of Systems   Review of Systems  All other systems reviewed and are negative.   Physical Exam Updated Vital Signs BP (!) 143/78   Pulse (!) 140   Temp 98.5 F (36.9 C)   Resp (!) 33   Wt 65.8 kg   LMP  (LMP Unknown)   SpO2 95%   BMI 31.38 kg/m  Physical Exam Vitals and nursing note reviewed.  Constitutional:      General: [name] not in acute distress.    Appearance: [name] well-developed. [name] not diaphoretic.  HENT:     Head: Normocephalic and atraumatic.  Cardiovascular:     Rate and Rhythm: Regular rhythm. Tachycardia present.     Heart sounds: No murmur heard.    No friction rub. No gallop.  Pulmonary:     Effort: Pulmonary effort is normal. No respiratory distress.     Breath sounds: Normal breath sounds. No wheezing.  Abdominal:     General: Bowel sounds are normal. There is no distension.     Palpations: Abdomen is soft.     Tenderness: There is no abdominal tenderness.  Musculoskeletal:        General: Normal range of motion.     Cervical back: Normal range of motion and neck supple.  Skin:    General: Skin is warm and dry.  Neurological:     General: No focal deficit present.     Mental Status: [name] alert and oriented to person, place, and time.     ED Results / Procedures / Treatments   Labs (all labs ordered are listed, but only abnormal results are displayed) Labs Reviewed  RESP PANEL BY RT-PCR (RSV, FLU A&B, COVID)  RVPGX2 - Abnormal; Notable for the following components:      Result Value   SARS Coronavirus 2 by RT PCR POSITIVE (*)    All other components within normal limits  BASIC METABOLIC PANEL - Abnormal; Notable for the following components:   Sodium 126 (*)    Chloride 91 (*)    CO2 19 (*)    Glucose, Bld 162 (*)     BUN 49 (*)    Creatinine, Ser 1.87 (*)    GFR, Estimated 37 (*)    Anion gap 16 (*)    All other components within normal limits  CBC WITH DIFFERENTIAL/PLATELET - Abnormal; Notable for the following components:   Hemoglobin 11.6 (*)    HCT 33.6 (*)    All other components within normal limits  LACTIC ACID, PLASMA - Abnormal; Notable for  the following components:   Lactic Acid, Venous 2.4 (*)    All other components within normal limits  GROUP A STREP BY PCR  LACTIC ACID, PLASMA    EKG EKG Interpretation  Date/Time:  Monday March 14 2022 02:44:53 EST Ventricular Rate:  145 PR Interval:  112 QRS Duration: 80 QT Interval:  348 QTC Calculation: 540 R Axis:   5 Text Interpretation: Sinus tachycardia Nonspecific ST abnormality Abnormal ECG Confirmed by Veryl Speak [phone]) on 03/14/2022 2:52:49 AM  Radiology DG Chest Portable 1 View  Result Date: 03/14/2022 CLINICAL DATA:  URI and sob EXAM: PORTABLE CHEST 1 VIEW COMPARISON:  Chest x-ray 03/04/2022 FINDINGS: The heart and mediastinal contours are unchanged. CardiaoMEMS noted overlying the left chest. Question left upper lobe airspace opacity. No pulmonary edema. No pleural effusion. No pneumothorax. No acute osseous abnormality. IMPRESSION: Question left upper lobe airspace opacity. Correlate with possible overlying cardiac leads. Finding may represent a true pulmonary finding such as infection or nodule. Followup PA and lateral chest X-ray is recommended in 3-4 weeks following therapy to ensure resolution. Electronically Signed   By: Iven Finn M.D.   On: 03/14/2022 03:23    Procedures Procedures    Medications Ordered in ED Medications - No data to display  ED Course/ Medical Decision Making/ A&P  Patient is a 31 year old female with extensive past medical history as described in the HPI.  She presents today for evaluation of URI symptoms for the past 2 days.  She arrives here afebrile and with stable vital signs  except for sinus tachycardia with heart rate in the 130s to 140s.  On exam, there is tenderness to the mid abdomen and this is the patient's only other complaint.  Workup initiated including CBC, metabolic panel, lactate, and COVID testing.  Initial lactate is 2.4 and COVID swab is positive, but all studies otherwise are unremarkable.  Patient remained persistently tachycardic throughout her ER visit.  She was given 1500 cc of fluid cautiously given her history of CHF.  Her heart rate did not improve.  At this point, I decided to pursue her other complaint, that being abdominal pain with a CT scan.  This showed no evidence for acute intra-abdominal pathology with the exception of findings that could be suggestive of diarrhea.  Patient was given IV Lopressor with some improvement in her heart rate, however she is still tachycardic with a rate of 120.  Given this persistent tachycardia, I do have some concern for pulmonary embolism so did add on a D-dimer.  As patient has renal insufficiency with a GFR of 37 and has already received contrast dye, I feel as though admitting the patient for VQ scan and further workup is the most appropriate course of action.  I have spoken with the hospitalist, Dr. Claria Dice who agrees to admit.  CRITICAL CARE Performed by: Veryl Speak Total critical care time: 40 minutes Critical care time was exclusive of separately billable procedures and treating other patients. Critical care was necessary to treat or prevent imminent or life-threatening deterioration. Critical care was time spent personally by me on the following activities: development of treatment plan with patient and/or surrogate as well as nursing, discussions with consultants, evaluation of patient's response to treatment, examination of patient, obtaining history from patient or surrogate, ordering and performing treatments and interventions, ordering and review of laboratory studies, ordering and review of  radiographic studies, pulse oximetry and re-evaluation of patient's condition.   Final Clinical Impression(s) / ED Diagnoses Final diagnoses:  None    Rx / DC Orders ED Discharge Orders     None         Veryl Speak, MD 03/14/22 [phone]

## 2022-03-14 NOTE — Progress Notes (Addendum)
Jennifer [name] is a 31 year old female with extensive past medical history including diabetes, brain tumor status postchemotherapy, CHF/cardiomyopathy secondary to chemotherapy EF 35 to 40%, hypertension, hypothyroidism, CKD 3, hypertriglyceridemia, GERD  She presents with complaint of URI symptoms such as sore throat cough, congestion, headache, and abdominal pain.  Workup in the ER revealed patient is COVID-positive, however, she is satting normally on room air.  Labs otherwise normal except for patient's sustaining a heart rate of 130s to 140s.  She was given 5 mg IV metoprolol and gentle IV fluids hydration 1500 cc, given patient's decreased EF.  Patient remains tachycardic.  Observation requested.  D-dimer ordered not yet resulted.  Unable to do CT given patient's elevated creatinine and patient had CT abdomen pelvis with contrast.  Defer to a.m. team to order VQ scan based on D-dimer

## 2022-03-14 NOTE — Telephone Encounter (Signed)
Jennifer [name] had called me regarding pts cardiomems that it was elevated, pt has left to go out of town today- Jennifer [name] instructed pt to increase her torsemide to 49m for 2 days and then go to [facility].  I had contacted pt to let her know, she understood. I then told pt her plan needs to be prevention of the fluid overload since it is so difficult for her to get it off once its on.   KaMarylouise StacksEMCave Junction2/14/2023

## 2022-03-14 NOTE — H&P (Addendum)
History and Physical    Jennifer [name] MHD:[id number] DOB: [birth year] DOA: 03/14/2022  PCP: Sue Lush, PA-C   Patient coming from: Home   Chief Complaint: Cough, congestion, aches, SOB   HPI: Jennifer [name] is a 31 y.o. 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  Patient reports that she was exposed to someone with an upper respiratory illness and then, 2 days ago, developed aches, malaise, sore throat, cough, congestion, shortness of breath.  She has also been experiencing upper abdominal discomfort with loose stool and nausea.  She denies chest pain or leg swelling, notes that she usually accumulates her excess fluid in her abdomen, but does not feel that she is hypervolemic at this time.  [place] ED Course: Upon arrival to the ED, patient is found to be afebrile and saturating adequately initially on room air with tachypnea, tachycardia, and stable blood pressure.  She later began dropping saturations and is requiring 2 L/min of supplemental oxygen.  EKG demonstrates sinus tachycardia 345.  Chest x-ray with question of left upper lobe opacity.  No acute findings noted on CT of the abdomen and pelvis.  COVID-19 PCR was positive.  ESR was 83 and CRP 7.1.  D-dimer is 0.83.  Sodium is 126, creatinine 1.87, and lactic acid 2.4 initially, down to 1.4 after IV fluids.  Patient was treated in the emergency department with 2 doses of Dilaudid, Toradol, Zofran, Lopressor, Benadryl, albuterol, and 1.5 L of normal saline.  She was transferred to [facility] for admission.  Review of Systems:  All other systems reviewed and apart from HPI, are negative.  Past Medical History:  Diagnosis Date   Brain tumor  ([place]) 03/29/1995   astrocytoma   CHF (congestive heart failure) ([facility])    Cholesterosis    DM (diabetes mellitus) ([place]) 10/10/2018   Fatty liver    HTN (hypertension) 10/10/2018   Hypothyroidism 10/10/2018   Lipoprotein deficiency    Lung disease    longevity long term   Pancreatitis    Polycystic ovary syndrome     Past Surgical History:  Procedure Laterality Date   ABDOMINAL SURGERY     pt states during miscarriage got her intestine   BRAIN SURGERY     EYE MUSCLE SURGERY Right 03/28/2014   PRESSURE SENSOR/CARDIOMEMS N/A 12/06/2021   Procedure: PRESSURE SENSOR/CARDIOMEMS;  Surgeon: [name], MD;  Location: [facility];  Service: Cardiovascular;  Laterality: N/A;   RIGHT HEART CATH N/A 08/05/2021   Procedure: RIGHT HEART CATH;  Surgeon: [name], MD;  Location: [facility];  Service: Cardiovascular;  Laterality: N/A;   RIGHT HEART CATH N/A 12/06/2021   Procedure: RIGHT HEART CATH;  Surgeon: [name], MD;  Location: [facility];  Service: Cardiovascular;  Laterality: N/A;   RIGHT/LEFT HEART CATH AND CORONARY ANGIOGRAPHY N/A 12/03/2020   Procedure: RIGHT/LEFT HEART CATH AND CORONARY ANGIOGRAPHY;  Surgeon: [name], MD;  Location: [facility];  Service: Cardiovascular;  Laterality: N/A;   VENTRICULOSTOMY  03/28/1997    Social History:   reports that she has never smoked. She has never used smokeless tobacco. She reports that she does not drink alcohol and does not use drugs.  Allergies  Allergen Reactions   Ketamine Other (  See Comments)    In vegetative state for 15 minutes per pt   Erythromycin    Fentanyl Nausea And Vomiting   Maitake Mushroom [[place]] Itching    Itchy throat   Penicillin V Itching    Gi upset   Shellfish Allergy Other (See Comments)    Pt has never had shellfish but tested positive on allergy test. Pt states contrast in CT is okay   Morphine Itching and Rash   Penicillins Rash    Has patient  had a PCN reaction causing immediate rash, facial/tongue/throat swelling, SOB or lightheadedness with hypotension: Y Has patient had a PCN reaction causing severe rash involving mucus membranes or skin necrosis: Y Has patient had a PCN reaction that required hospitalization: N Has patient had a PCN reaction occurring within the last 10 years: Y If all of the above answers are "NO", then may proceed with Cephalosporin use.    Prednisone Rash    Family History  Problem Relation Age of Onset   Diabetes Mother    Hypertension Mother    Hyperlipidemia Mother    Thyroid disease Mother    Hypertension Father    Diabetes Father    Pancreatic cancer Paternal Aunt    Pancreatic cancer Paternal Uncle    Colon cancer Neg Hx    Esophageal cancer Neg Hx    Stomach cancer Neg Hx      Prior to Admission medications   Medication Sig Start Date End Date Taking? Authorizing Provider  albuterol (VENTOLIN HFA) 108 (90 Base) MCG/ACT inhaler Inhale 1-2 puffs into the lungs every 6 (six) hours as needed for wheezing or shortness of breath. 12/31/18   [provider]  aspirin EC 81 MG EC tablet Take 1 tablet (81 mg total) by mouth daily. Swallow whole. 12/07/20   Ezekiel Slocumb, DO  carvedilol (COREG) 3.125 MG tablet Take 1 tablet (3.125 mg total) by mouth 2 (two) times daily with a meal. 02/13/22 03/15/22  Donne Hazel, MD  DULoxetine (CYMBALTA) 30 MG capsule Take 30 mg by mouth at bedtime. 01/28/19 01/27/23  [provider]  ergocalciferol (VITAMIN D2) 1.25 MG (50000 UT) capsule Take 50,000 Units by mouth once a week.    [provider]  Evolocumab (REPATHA) 140 MG/ML SOSY Inject 140 mg into the skin See admin instructions. Inject 140 mg subcutaneously every other Sunday evening    [provider]  fenofibrate 160 MG tablet Take 1 tablet (160 mg total) by mouth daily. 12/07/20   Nicole Kindred A, DO  insulin aspart (NOVOLOG) 100 UNIT/ML FlexPen Inject 60 Units into  the skin 3 (three) times daily with meals. 02/13/22   Donne Hazel, MD  insulin aspart (NOVOLOG) 100 UNIT/ML FlexPen For before meals and at bedtime Sliding scale: CBG 70 - 120: 0 units  CBG 121 - 150: 3 units  CBG 151 - 200: 4 units  CBG 201 - 250: 7 units  CBG 251 - 300: 11 units  CBG 301 - 350: 15 units  CBG 351 - 400: 20 units  CBG > 400: notify your doctor 02/13/22   Donne Hazel, MD  insulin glargine (LANTUS) 100 UNIT/ML Solostar Pen Inject 60 Units into the skin 2 (two) times daily. 02/13/22   Donne Hazel, MD  Insulin Pen Needle 32G X 4 MM MISC 1 Device by Does not apply route QID. For use with insulin pens 02/13/22   Donne Hazel, MD  ivabradine (CORLANOR) 7.5 MG TABS tablet  Take 1 tablet (7.5 mg total) by mouth 2 (two) times daily with a meal. 08/02/21   [name], Shaune Pascal, MD  levothyroxine (SYNTHROID) 125 MCG tablet Take 1 tablet (125 mcg total) by mouth daily at 6 (six) AM. 12/07/20   Ezekiel Slocumb, DO  loratadine (CLARITIN) 10 MG tablet Take 10 mg by mouth daily.    [provider]  ondansetron (ZOFRAN) 4 MG tablet Take 4 mg by mouth every 8 (eight) hours as needed for nausea or vomiting. 12/19/18   [provider]  pantoprazole (PROTONIX) 40 MG tablet Take 40 mg by mouth daily. 12/24/19   [provider]  pregabalin (LYRICA) 150 MG capsule Take 1 capsule (150 mg total) by mouth at bedtime. Patient taking differently: Take 300 mg by mouth at bedtime. 12/12/21   Domenic Polite, MD  prochlorperazine (COMPAZINE) 5 MG tablet Take 5 mg by mouth daily.    [provider]  rosuvastatin (CRESTOR) 40 MG tablet Take 40 mg by mouth daily. 08/09/21   [provider]  sildenafil (REVATIO) 20 MG tablet Take 1 tablet (20 mg total) by mouth 3 (three) times daily. 12/15/21   [name], Shaune Pascal, MD  sodium bicarbonate 650 MG tablet Take 650 mg by mouth 2 (two) times daily.    [provider]  torsemide 40 MG TABS Take 40 mg by mouth  daily. 02/14/22 03/16/22  Donne Hazel, MD  tretinoin (RETIN-A) 0.05 % cream Apply 1 application topically at bedtime. 01/17/21   [provider]    Physical Exam: Vitals:   03/14/22 1930 03/14/22 2030 03/14/22 2100 03/14/22 2143  BP: 123/61 (!) 147/75 121/69 117/66  Pulse: (!) 118 (!) 123 (!) 119 (!) 128  Resp: (!) 23 (!) 21  17  Temp:    98.9 F (37.2 C)  TempSrc:    Oral  SpO2: 100% 99% 99% 100%  Weight:        Constitutional: NAD, no pallor or diaphoresis  Eyes: PERTLA, lids and conjunctivae normal ENMT: Mucous membranes are moist. Posterior pharynx clear of any exudate or lesions.   Neck: supple, no masses  Respiratory: no wheezing, no crackles. No accessory muscle use.  Cardiovascular: S1 & S2 heard, regular rate and rhythm. No extremity edema. Abdomen: soft, tender in upper abdomen without rebound pain or guarding. Bowel sounds active.  Musculoskeletal: no clubbing / cyanosis. No joint deformity upper and lower extremities.   Skin: no significant rashes, lesions, ulcers. Warm, dry, well-perfused. Neurologic: Ptosis on right, no aphasia or dysarthria. Moving all extremities. Alert and oriented.  Psychiatric: calm. Cooperative.    Labs and Imaging on Admission: I have personally reviewed following labs and imaging studies  CBC: Recent Labs  Lab 03/14/22 0256  WBC 8.6  NEUTROABS 7.0  HGB 11.6*  HCT 33.6*  MCV 85.3  PLT 561   Basic Metabolic Panel: Recent Labs  Lab 03/14/22 0256  NA 126*  K 3.8  CL 91*  CO2 19*  GLUCOSE 162*  BUN 49*  CREATININE 1.87*  CALCIUM 9.6   GFR: Estimated Creatinine Clearance: 34.4 mL/min (A) (by C-G formula based on SCr of 1.87 mg/dL (H)). Liver Function Tests: Recent Labs  Lab 03/14/22 0614 03/14/22 0621  AST 52* 65*  ALT 46* 50*  ALKPHOS 108 110  BILITOT 0.6 0.7  PROT 7.1 7.0  ALBUMIN 4.1 3.6   Recent Labs  Lab 03/14/22 0614  LIPASE 23   No results for input(s): "AMMONIA" in the last 168  hours.  Coagulation Profile: No results for input(s): "INR", "PROTIME" in the last 168 hours. Cardiac Enzymes: No results for input(s): "CKTOTAL", "CKMB", "CKMBINDEX", "TROPONINI" in the last 168 hours. BNP (last 3 results) No results for input(s): "PROBNP" in the last 8760 hours. HbA1C: No results for input(s): "HGBA1C" in the last 72 hours. CBG: Recent Labs  Lab 03/14/22 0829 03/14/22 1133 03/14/22 1604 03/14/22 1745 03/14/22 2147  GLUCAP 51* 86 124* 122* 192*   Lipid Profile: No results for input(s): "CHOL", "HDL", "LDLCALC", "TRIG", "CHOLHDL", "LDLDIRECT" in the last 72 hours. Thyroid Function Tests: No results for input(s): "TSH", "T4TOTAL", "FREET4", "T3FREE", "THYROIDAB" in the last 72 hours. Anemia Panel: No results for input(s): "VITAMINB12", "FOLATE", "FERRITIN", "TIBC", "IRON", "RETICCTPCT" in the last 72 hours. Urine analysis:    Component Value Date/Time   COLORURINE YELLOW 02/05/2022 [place] 02/05/2022 2240   LABSPEC 1.013 02/05/2022 2240   PHURINE 5.0 02/05/2022 2240   GLUCOSEU 100 (A) 02/05/2022 2240   HGBUR NEGATIVE 02/05/2022 2240   BILIRUBINUR NEGATIVE 02/05/2022 2240   KETONESUR NEGATIVE 02/05/2022 2240   PROTEINUR TRACE (A) 02/05/2022 2240   NITRITE NEGATIVE 02/05/2022 2240   LEUKOCYTESUR NEGATIVE 02/05/2022 2240   Sepsis Labs: _0 (procalcitonin:4,lacticidven:4) ) Recent Results (from the past 240 hour(s))  Resp panel by RT-PCR (RSV, Flu A&B, Covid) Anterior Nasal Swab     Status: Abnormal   Collection Time: 03/14/22  2:42 AM   Specimen: Anterior Nasal Swab  Result Value Ref Range Status   SARS Coronavirus 2 by RT PCR POSITIVE (A) NEGATIVE Final    Comment: (NOTE) SARS-CoV-2 target nucleic acids are DETECTED.  The SARS-CoV-2 RNA is generally detectable in upper respiratory specimens during the acute phase of infection. Positive results are indicative of the presence of the identified virus, but do not rule out  bacterial infection or co-infection with other pathogens not detected by the test. Clinical correlation with patient history and other diagnostic information is necessary to determine patient infection status. The expected result is Negative.  Fact Sheet for Patients: EntrepreneurPulse.com.au  Fact Sheet for Healthcare Providers: IncredibleEmployment.be  This test is not yet approved or cleared by the [place] FDA and  has been authorized for detection and/or diagnosis of SARS-CoV-2 by FDA under an Emergency Use Authorization (EUA).  This EUA will remain in effect (meaning this test can be used) for the duration of  the COVID-19 declaration under Section 564(b)(1) of the A ct, 21 U.S.C. section 360bbb-3(b)(1), unless the authorization is terminated or revoked sooner.     Influenza A by PCR NEGATIVE NEGATIVE Final   Influenza B by PCR NEGATIVE NEGATIVE Final    Comment: (NOTE) The Xpert Xpress SARS-CoV-2/FLU/RSV plus assay is intended as an aid in the diagnosis of influenza from Nasopharyngeal swab specimens and should not be used as a sole basis for treatment. Nasal washings and aspirates are unacceptable for Xpert Xpress SARS-CoV-2/FLU/RSV testing.  Fact Sheet for Patients: EntrepreneurPulse.com.au  Fact Sheet for Healthcare Providers: IncredibleEmployment.be  This test is not yet approved or cleared by the [place] FDA and has been authorized for detection and/or diagnosis of SARS-CoV-2 by FDA under an Emergency Use Authorization (EUA). This EUA will remain in effect (meaning this test can be used) for the duration of the COVID-19 declaration under Section 564(b)(1) of the Act, 21 U.S.C. section 360bbb-3(b)(1), unless the authorization is terminated or revoked.     Resp Syncytial Virus by PCR NEGATIVE NEGATIVE Final    Comment: (NOTE) Fact Sheet for  Patients: EntrepreneurPulse.com.au  Fact Sheet for Healthcare Providers: IncredibleEmployment.be  This test is not yet approved or cleared by the [place] FDA and has been authorized for detection and/or diagnosis of SARS-CoV-2 by FDA under an Emergency Use Authorization (EUA). This EUA will remain in effect (meaning this test can be used) for the duration of the COVID-19 declaration under Section 564(b)(1) of the Act, 21 U.S.C. section 360bbb-3(b)(1), unless the authorization is terminated or revoked.  Performed at [facility], [address] [place], [place] [address]   Group A Strep by PCR     Status: None   Collection Time: 03/14/22  2:42 AM   Specimen: Anterior Nasal Swab; Sterile Swab  Result Value Ref Range Status   Group A Strep by PCR NOT DETECTED NOT DETECTED Final    Comment: Performed at [facility], [address] [place], [place] [address]     Radiological Exams on Admission: CT ABDOMEN PELVIS W CONTRAST  Result Date: 03/14/2022 CLINICAL DATA:  Abdominal pain, acute. EXAM: CT ABDOMEN AND PELVIS WITH CONTRAST TECHNIQUE: Multidetector CT imaging of the abdomen and pelvis was performed using the standard protocol following bolus administration of intravenous contrast. RADIATION DOSE REDUCTION: This exam was performed according to the departmental dose-optimization program which includes automated exposure control, adjustment of the mA and/or kV according to patient size and/or use of iterative reconstruction technique. CONTRAST:  46m OMNIPAQUE IOHEXOL 300 MG/ML  SOLN COMPARISON:  02/06/2022 and 03/05/2021 FINDINGS: Lower chest: There are multiple small lung nodules within both lung bases measuring up to 4 mm. These are unchanged from the previous exam from 03/05/2021. No pleural fluid or airspace disease. Hepatobiliary: Subjective hepatic steatosis. No suspicious liver lesion. Gallbladder  appears within normal limits. No bile duct dilatation. Pancreas: Unremarkable. No pancreatic ductal dilatation or surrounding inflammatory changes. Spleen: Normal in size without focal abnormality. Adrenals/Urinary Tract: Normal adrenal glands. No nephrolithiasis, hydronephrosis or kidney mass. Urinary bladder appears within normal limits. Stomach/Bowel: Stomach appears normal. The appendix is visualized and appears normal. No bowel wall thickening, inflammation or distension. Liquid stool noted throughout the colon. Vascular/Lymphatic: Age advanced aortic atherosclerosis. No signs of abdominopelvic adenopathy. Reproductive: The uterus is diminutive or surgically absent. Unremarkable appearance of the ovaries which appears stable from prior study. Other: No free fluid or fluid collection. No signs of pneumoperitoneum. Musculoskeletal: No acute or significant osseous findings. IMPRESSION: 1. No acute findings within the abdomen or pelvis. 2. Liquid stool noted throughout the colon. Correlate for any clinical signs or symptoms of diarrhea. 3. Multiple small lung nodules within both lung bases measuring up to 4 mm. These are unchanged from 03/05/2021 compatible with a benign process. 4. Subjective hepatic steatosis. 5.  Aortic Atherosclerosis (ICD10-I70.0). Electronically Signed   By: TKerby MoorsM.D.   On: 03/14/2022 06:14   DG Chest Portable 1 View  Result Date: 03/14/2022 CLINICAL DATA:  URI and sob EXAM: PORTABLE CHEST 1 VIEW COMPARISON:  Chest x-ray 03/04/2022 FINDINGS: The heart and mediastinal contours are unchanged. CardiaoMEMS noted overlying the left chest. Question left upper lobe airspace opacity. No pulmonary edema. No pleural effusion. No pneumothorax. No acute osseous abnormality. IMPRESSION: Question left upper lobe airspace opacity. Correlate with possible overlying cardiac leads. Finding may represent a true pulmonary finding such as infection or nodule. Followup PA and lateral chest X-ray is  recommended in 3-4 weeks following therapy to ensure resolution. Electronically Signed   By: MIven FinnM.D.   On: 03/14/2022 03:23    EKG:  Independently reviewed. Sinus tachycardia, rate 145.   Assessment/Plan   1. COVID-19; acute hypoxic respiratory failure  - Present with 2 days of aches, cough, and SOB and is found to have COVID-19 and new 2 Lpm supplemental O2 requirement  - Continue isolation, start remdesivir and Decadron, continue supplemental O2 as needed, trend markers    2. NICM  - She has NICM with recovered EF   - Continue torsemide and Coreg, monitor weight and I/Os    3. Hyponatremia  - Serum sodium is 126 on admission   - She was given 1.5 liters NS in ED, will repeat chem panel in am    4. CKD IIIb  - SCr is 1.87 on admission, appears to be her baseline   - Renally-dose medications, monitor    5. IDDM  - A1c was 8.5% in November 2023   - Continue CBGs and insulin; she is complaining of nausea and does not want to eat so will start with lower dosing     6. CAD  - Hx of single-vessel CAD  - Continue ASA and statin   7. Hypothyroidism  - Continue Synthroid    8. D-dimer elevated  - D-dimer is 0.83 in ED, likely from acute COVID-19 infection  - Tachycardia appears chronic looking back at vitals from office visit, she is on ivabradine  - Treat COVID, trend d-dimer, consider DVT/PE workup     DVT prophylaxis: sq heparin  Code Status: Full  Level of Care: Level of care: Telemetry Medical Family Communication: None present   Disposition Plan:  Patient is from: home  Anticipated d/c is to: Home  Anticipated d/c date is: 03/16/22  Patient currently: Pending improved/stable respiratory status   Consults called: none  Admission status: Observation     [name], MD [facility]  03/14/2022, 10:26 PM

## 2022-03-15 DIAGNOSIS — J1282 Pneumonia due to coronavirus disease 2019: Secondary | ICD-10-CM | POA: Diagnosis present

## 2022-03-15 DIAGNOSIS — E785 Hyperlipidemia, unspecified: Secondary | ICD-10-CM | POA: Diagnosis present

## 2022-03-15 DIAGNOSIS — Z794 Long term (current) use of insulin: Secondary | ICD-10-CM | POA: Diagnosis not present

## 2022-03-15 DIAGNOSIS — E039 Hypothyroidism, unspecified: Secondary | ICD-10-CM | POA: Diagnosis present

## 2022-03-15 DIAGNOSIS — K760 Fatty (change of) liver, not elsewhere classified: Secondary | ICD-10-CM | POA: Diagnosis present

## 2022-03-15 DIAGNOSIS — Z85841 Personal history of malignant neoplasm of brain: Secondary | ICD-10-CM | POA: Diagnosis not present

## 2022-03-15 DIAGNOSIS — K219 Gastro-esophageal reflux disease without esophagitis: Secondary | ICD-10-CM | POA: Diagnosis present

## 2022-03-15 DIAGNOSIS — E871 Hypo-osmolality and hyponatremia: Secondary | ICD-10-CM | POA: Diagnosis present

## 2022-03-15 DIAGNOSIS — U071 COVID-19: Secondary | ICD-10-CM | POA: Diagnosis present

## 2022-03-15 DIAGNOSIS — E282 Polycystic ovarian syndrome: Secondary | ICD-10-CM | POA: Diagnosis present

## 2022-03-15 DIAGNOSIS — Z8249 Family history of ischemic heart disease and other diseases of the circulatory system: Secondary | ICD-10-CM | POA: Diagnosis not present

## 2022-03-15 DIAGNOSIS — R112 Nausea with vomiting, unspecified: Secondary | ICD-10-CM | POA: Diagnosis present

## 2022-03-15 DIAGNOSIS — N184 Chronic kidney disease, stage 4 (severe): Secondary | ICD-10-CM | POA: Diagnosis present

## 2022-03-15 DIAGNOSIS — R109 Unspecified abdominal pain: Secondary | ICD-10-CM | POA: Diagnosis present

## 2022-03-15 DIAGNOSIS — I129 Hypertensive chronic kidney disease with stage 1 through stage 4 chronic kidney disease, or unspecified chronic kidney disease: Secondary | ICD-10-CM | POA: Diagnosis present

## 2022-03-15 DIAGNOSIS — N179 Acute kidney failure, unspecified: Secondary | ICD-10-CM | POA: Diagnosis present

## 2022-03-15 DIAGNOSIS — J9601 Acute respiratory failure with hypoxia: Secondary | ICD-10-CM | POA: Diagnosis present

## 2022-03-15 DIAGNOSIS — R000 Tachycardia, unspecified: Secondary | ICD-10-CM | POA: Diagnosis present

## 2022-03-15 DIAGNOSIS — E669 Obesity, unspecified: Secondary | ICD-10-CM | POA: Diagnosis present

## 2022-03-15 DIAGNOSIS — Z6831 Body mass index (BMI) 31.0-31.9, adult: Secondary | ICD-10-CM | POA: Diagnosis not present

## 2022-03-15 DIAGNOSIS — I428 Other cardiomyopathies: Secondary | ICD-10-CM | POA: Diagnosis present

## 2022-03-15 DIAGNOSIS — I2510 Atherosclerotic heart disease of native coronary artery without angina pectoris: Secondary | ICD-10-CM | POA: Diagnosis present

## 2022-03-15 DIAGNOSIS — E1169 Type 2 diabetes mellitus with other specified complication: Secondary | ICD-10-CM | POA: Diagnosis present

## 2022-03-15 DIAGNOSIS — E1122 Type 2 diabetes mellitus with diabetic chronic kidney disease: Secondary | ICD-10-CM | POA: Diagnosis present

## 2022-03-15 DIAGNOSIS — E872 Acidosis, unspecified: Secondary | ICD-10-CM | POA: Diagnosis not present

## 2022-03-15 LAB — MAGNESIUM: Magnesium: 1.7 mg/dL (ref 1.7–2.4)

## 2022-03-15 LAB — GLUCOSE, CAPILLARY
Glucose-Capillary: 262 mg/dL — ABNORMAL HIGH (ref 70–99)
Glucose-Capillary: 338 mg/dL — ABNORMAL HIGH (ref 70–99)
Glucose-Capillary: 431 mg/dL — ABNORMAL HIGH (ref 70–99)
Glucose-Capillary: 61 mg/dL — ABNORMAL LOW (ref 70–99)
Glucose-Capillary: 62 mg/dL — ABNORMAL LOW (ref 70–99)
Glucose-Capillary: 64 mg/dL — ABNORMAL LOW (ref 70–99)

## 2022-03-15 LAB — CBC WITH DIFFERENTIAL/PLATELET
Abs Immature Granulocytes: 0.01 10*3/uL (ref 0.00–0.07)
Basophils Absolute: 0 10*3/uL (ref 0.0–0.1)
Basophils Relative: 0 %
Eosinophils Absolute: 0.1 10*3/uL (ref 0.0–0.5)
Eosinophils Relative: 1 %
HCT: 30.8 % — ABNORMAL LOW (ref 36.0–46.0)
Hemoglobin: 9.6 g/dL — ABNORMAL LOW (ref 12.0–15.0)
Immature Granulocytes: 0 %
Lymphocytes Relative: 12 %
Lymphs Abs: 0.6 10*3/uL — ABNORMAL LOW (ref 0.7–4.0)
MCH: 27.8 pg (ref 26.0–34.0)
MCHC: 31.2 g/dL (ref 30.0–36.0)
MCV: 89.3 fL (ref 80.0–100.0)
Monocytes Absolute: 0.3 10*3/uL (ref 0.1–1.0)
Monocytes Relative: 6 %
Neutro Abs: 4.1 10*3/uL (ref 1.7–7.7)
Neutrophils Relative %: 81 %
Platelets: 142 10*3/uL — ABNORMAL LOW (ref 150–400)
RBC: 3.45 MIL/uL — ABNORMAL LOW (ref 3.87–5.11)
RDW: 16.1 % — ABNORMAL HIGH (ref 11.5–15.5)
WBC: 5.2 10*3/uL (ref 4.0–10.5)
nRBC: 0 % (ref 0.0–0.2)

## 2022-03-15 LAB — COMPREHENSIVE METABOLIC PANEL
ALT: 52 U/L — ABNORMAL HIGH (ref 0–44)
AST: 124 U/L — ABNORMAL HIGH (ref 15–41)
Albumin: 3.4 g/dL — ABNORMAL LOW (ref 3.5–5.0)
Alkaline Phosphatase: 106 U/L (ref 38–126)
Anion gap: 15 (ref 5–15)
BUN: 48 mg/dL — ABNORMAL HIGH (ref 6–20)
CO2: 15 mmol/L — ABNORMAL LOW (ref 22–32)
Calcium: 8.5 mg/dL — ABNORMAL LOW (ref 8.9–10.3)
Chloride: 101 mmol/L (ref 98–111)
Creatinine, Ser: 2.48 mg/dL — ABNORMAL HIGH (ref 0.44–1.00)
GFR, Estimated: 26 mL/min — ABNORMAL LOW (ref >60)
Glucose, Bld: 332 mg/dL — ABNORMAL HIGH (ref 70–99)
Potassium: 5 mmol/L (ref 3.5–5.1)
Sodium: 131 mmol/L — ABNORMAL LOW (ref 135–145)
Total Bilirubin: 0.8 mg/dL (ref 0.3–1.2)
Total Protein: 6.9 g/dL (ref 6.5–8.1)

## 2022-03-15 LAB — PHOSPHORUS: Phosphorus: 3.6 mg/dL (ref 2.5–4.6)

## 2022-03-15 LAB — C-REACTIVE PROTEIN: CRP: 11.5 mg/dL — ABNORMAL HIGH (ref <1.0)

## 2022-03-15 LAB — D-DIMER, QUANTITATIVE: D-Dimer, Quant: 0.8 ug/mL-FEU — ABNORMAL HIGH (ref 0.00–0.50)

## 2022-03-15 MED ORDER — FENOFIBRATE 160 MG PO TABS
160.0000 mg | ORAL_TABLET | Freq: Every day | ORAL | Status: DC
  Administered 2022-03-15 – 2022-03-18 (×4): 160 mg via ORAL
  Filled 2022-03-15 (×4): qty 1

## 2022-03-15 MED ORDER — INSULIN ASPART 100 UNIT/ML IJ SOLN
0.0000 [IU] | Freq: Every day | INTRAMUSCULAR | Status: DC
  Administered 2022-03-17: 3 [IU] via SUBCUTANEOUS

## 2022-03-15 MED ORDER — VITAMIN D (ERGOCALCIFEROL) 1.25 MG (50000 UNIT) PO CAPS
50000.0000 [IU] | ORAL_CAPSULE | ORAL | Status: DC
  Administered 2022-03-16: 50000 [IU] via ORAL
  Filled 2022-03-15: qty 1

## 2022-03-15 MED ORDER — INSULIN ASPART 100 UNIT/ML IJ SOLN
15.0000 [IU] | Freq: Three times a day (TID) | INTRAMUSCULAR | Status: DC
  Administered 2022-03-15 – 2022-03-17 (×4): 15 [IU] via SUBCUTANEOUS

## 2022-03-15 MED ORDER — PANCRELIPASE (LIP-PROT-AMYL) 36000-114000 UNITS PO CPEP
36000.0000 [IU] | ORAL_CAPSULE | Freq: Three times a day (TID) | ORAL | Status: DC
  Administered 2022-03-15 – 2022-03-18 (×9): 36000 [IU] via ORAL
  Filled 2022-03-15 (×10): qty 1

## 2022-03-15 MED ORDER — SILDENAFIL CITRATE 20 MG PO TABS
20.0000 mg | ORAL_TABLET | Freq: Three times a day (TID) | ORAL | Status: DC
  Administered 2022-03-15 – 2022-03-18 (×9): 20 mg via ORAL
  Filled 2022-03-15 (×9): qty 1

## 2022-03-15 MED ORDER — LEVALBUTEROL HCL 0.63 MG/3ML IN NEBU
0.6300 mg | INHALATION_SOLUTION | Freq: Three times a day (TID) | RESPIRATORY_TRACT | Status: DC | PRN
  Administered 2022-03-18: 0.63 mg via RESPIRATORY_TRACT
  Filled 2022-03-15 (×2): qty 3

## 2022-03-15 MED ORDER — INSULIN ASPART 100 UNIT/ML IJ SOLN
0.0000 [IU] | Freq: Three times a day (TID) | INTRAMUSCULAR | Status: DC
  Administered 2022-03-15: 11 [IU] via SUBCUTANEOUS
  Administered 2022-03-17 (×2): 3 [IU] via SUBCUTANEOUS
  Administered 2022-03-17: 4 [IU] via SUBCUTANEOUS
  Administered 2022-03-18: 15 [IU] via SUBCUTANEOUS

## 2022-03-15 MED ORDER — INSULIN GLARGINE-YFGN 100 UNIT/ML ~~LOC~~ SOLN
60.0000 [IU] | Freq: Two times a day (BID) | SUBCUTANEOUS | Status: DC
  Administered 2022-03-15 – 2022-03-16 (×3): 60 [IU] via SUBCUTANEOUS
  Filled 2022-03-15 (×5): qty 0.6

## 2022-03-15 NOTE — Progress Notes (Signed)
PROGRESS NOTE    Jennifer [name]  XFG:[id number] DOB: 07-21-1990 DOA: 03/14/2022 PCP: Sue Lush, PA-C    Brief Narrative:  Jennifer [name] is a 31 y.o. 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   Patient reports that she was exposed to someone with an upper respiratory illness and then, 2 days ago, developed aches, malaise, sore throat, cough, congestion, shortness of breath.  She has also been experiencing upper abdominal discomfort with loose stool and nausea.  She denies chest pain or leg swelling, notes that she usually accumulates her excess fluid in her abdomen, but does not feel that she is hypervolemic at this time.   Assessment and Plan:  COVID-19; acute hypoxic respiratory failure  - Present with 2 days of aches, cough, and SOB and is found to have COVID-19 and new 2 Lpm supplemental O2 requirement  - Continue isolation,  remdesivir (started in ER) and Decadron, continue supplemental O2 as needed, trend markers     NICM  - She has NICM with recovered EF   -  monitor weight and I/Os   -hold torosemide with her AKI   Hyponatremia  - Serum sodium is 126 on admission   - improved   AKI  on CKD IV (patient says she is stage IV) - SCr is 1.87 on admission, appears to be her baseline -- given IV contrast and now Cr is 2.48  - Renally-dose medications, monitor   -follows with nephrology at [facility] (last Cr 11/30- 2 at [facility])   Elevated LFTs -trend  IDDM  - A1c was 8.5% in November 2023   - Continue CBGs and insulin-- is on steroids so will need adjustments   CAD  - Hx of single-vessel CAD  - Continue ASA and statin    Hypothyroidism  - Continue Synthroid      D-dimer elevated  - D-dimer is 0.83  in ED, likely from acute COVID-19 infection  - Tachycardia appears chronic looking back at vitals from office visit, she is on ivabradine  - Treat COVID, trend d-dimer, consider DVT/PE workup     Obesity Estimated body mass index is 31.38 kg/m as calculated from the following:   Height as of 02/06/22: 4' 9"  (1.448 m).   Weight as of this encounter: 65.8 kg.    DVT prophylaxis: heparin injection 5,000 Units Start: 03/14/22 2315    Code Status: Full Code   Disposition Plan:  Level of care: Telemetry Medical Status is: Inpatient Remains inpatient appropriate because: monitoring of labs    Consultants:  none   Subjective: Has ptosis on her right eye Not eating much  Objective: Vitals:   03/15/22 0117 03/15/22 0320 03/15/22 0445 03/15/22 0744  BP: (!) 140/92 (!) 156/97 (!) 142/97 (!) 139/92  Pulse: (!) 126   (!) 111  Resp: 16   19  Temp: 98.2 F (36.8 C)   98 F (36.7 C)  TempSrc: Oral   Oral  SpO2:    95%  Weight:        Intake/Output Summary (Last 24 hours) at 03/15/2022 1253 Last data filed at 03/15/2022 0400 Gross per 24 hour  Intake 530 ml  Output --  Net 530 ml   Filed Weights   03/14/22 0236  Weight: 65.8 kg  Examination:   General: Appearance:    Obese female in no acute distress     Lungs:     respirations unlabored  Heart:    Tachycardic.    MS:   All extremities are intact.    Neurologic:   Awake, alert       Data Reviewed: I have personally reviewed following labs and imaging studies  CBC: Recent Labs  Lab 03/14/22 0256 03/15/22 0353  WBC 8.6 5.2  NEUTROABS 7.0 4.1  HGB 11.6* 9.6*  HCT 33.6* 30.8*  MCV 85.3 89.3  PLT 207 389*   Basic Metabolic Panel: Recent Labs  Lab 03/14/22 0256 03/15/22 0353  NA 126* 131*  K 3.8 5.0  CL 91* 101  CO2 19* 15*  GLUCOSE 162* 332*  BUN 49* 48*  CREATININE 1.87* 2.48*  CALCIUM 9.6 8.5*  MG  --  1.7  PHOS  --  3.6   GFR: Estimated Creatinine Clearance: 25.9 mL/min (A) (by C-G  formula based on SCr of 2.48 mg/dL (H)). Liver Function Tests: Recent Labs  Lab 03/14/22 0614 03/14/22 0621 03/15/22 0353  AST 52* 65* 124*  ALT 46* 50* 52*  ALKPHOS 108 110 106  BILITOT 0.6 0.7 0.8  PROT 7.1 7.0 6.9  ALBUMIN 4.1 3.6 3.4*   Recent Labs  Lab 03/14/22 0614  LIPASE 23   No results for input(s): "AMMONIA" in the last 168 hours. Coagulation Profile: No results for input(s): "INR", "PROTIME" in the last 168 hours. Cardiac Enzymes: No results for input(s): "CKTOTAL", "CKMB", "CKMBINDEX", "TROPONINI" in the last 168 hours. BNP (last 3 results) No results for input(s): "PROBNP" in the last 8760 hours. HbA1C: No results for input(s): "HGBA1C" in the last 72 hours. CBG: Recent Labs  Lab 03/14/22 1604 03/14/22 1745 03/14/22 2147 03/15/22 0947 03/15/22 1241  GLUCAP 124* 122* 192* 431* 338*   Lipid Profile: No results for input(s): "CHOL", "HDL", "LDLCALC", "TRIG", "CHOLHDL", "LDLDIRECT" in the last 72 hours. Thyroid Function Tests: No results for input(s): "TSH", "T4TOTAL", "FREET4", "T3FREE", "THYROIDAB" in the last 72 hours. Anemia Panel: No results for input(s): "VITAMINB12", "FOLATE", "FERRITIN", "TIBC", "IRON", "RETICCTPCT" in the last 72 hours. Sepsis Labs: Recent Labs  Lab 03/14/22 0256 03/14/22 0614  LATICACIDVEN 2.4* 1.4    Recent Results (from the past 240 hour(s))  Resp panel by RT-PCR (RSV, Flu A&B, Covid) Anterior Nasal Swab     Status: Abnormal   Collection Time: 03/14/22  2:42 AM   Specimen: Anterior Nasal Swab  Result Value Ref Range Status   SARS Coronavirus 2 by RT PCR POSITIVE (A) NEGATIVE Final    Comment: (NOTE) SARS-CoV-2 target nucleic acids are DETECTED.  The SARS-CoV-2 RNA is generally detectable in upper respiratory specimens during the acute phase of infection. Positive results are indicative of the presence of the identified virus, but do not rule out bacterial infection or co-infection with other pathogens not detected  by the test. Clinical correlation with patient history and other diagnostic information is necessary to determine patient infection status. The expected result is Negative.  Fact Sheet for Patients: EntrepreneurPulse.com.au  Fact Sheet for Healthcare Providers: IncredibleEmployment.be  This test is not yet approved or cleared by the [place] FDA and  has been authorized for detection and/or diagnosis of SARS-CoV-2 by FDA under an Emergency Use Authorization (EUA).  This EUA will remain in effect (meaning this test can be used) for the duration of  the COVID-19 declaration under Section 564(b)(1) of the A ct, 21 U.S.C. section  360bbb-3(b)(1), unless the authorization is terminated or revoked sooner.     Influenza A by PCR NEGATIVE NEGATIVE Final   Influenza B by PCR NEGATIVE NEGATIVE Final    Comment: (NOTE) The Xpert Xpress SARS-CoV-2/FLU/RSV plus assay is intended as an aid in the diagnosis of influenza from Nasopharyngeal swab specimens and should not be used as a sole basis for treatment. Nasal washings and aspirates are unacceptable for Xpert Xpress SARS-CoV-2/FLU/RSV testing.  Fact Sheet for Patients: EntrepreneurPulse.com.au  Fact Sheet for Healthcare Providers: IncredibleEmployment.be  This test is not yet approved or cleared by the [place] FDA and has been authorized for detection and/or diagnosis of SARS-CoV-2 by FDA under an Emergency Use Authorization (EUA). This EUA will remain in effect (meaning this test can be used) for the duration of the COVID-19 declaration under Section 564(b)(1) of the Act, 21 U.S.C. section 360bbb-3(b)(1), unless the authorization is terminated or revoked.     Resp Syncytial Virus by PCR NEGATIVE NEGATIVE Final    Comment: (NOTE) Fact Sheet for Patients: EntrepreneurPulse.com.au  Fact Sheet for Healthcare  Providers: IncredibleEmployment.be  This test is not yet approved or cleared by the [place] FDA and has been authorized for detection and/or diagnosis of SARS-CoV-2 by FDA under an Emergency Use Authorization (EUA). This EUA will remain in effect (meaning this test can be used) for the duration of the COVID-19 declaration under Section 564(b)(1) of the Act, 21 U.S.C. section 360bbb-3(b)(1), unless the authorization is terminated or revoked.  Performed at [facility], [address], [place], [place] [address]   Group A Strep by PCR     Status: None   Collection Time: 03/14/22  2:42 AM   Specimen: Anterior Nasal Swab; Sterile Swab  Result Value Ref Range Status   Group A Strep by PCR NOT DETECTED NOT DETECTED Final    Comment: Performed at [facility], [address], [place], [place] [address]         Radiology Studies: CT ABDOMEN PELVIS W CONTRAST  Result Date: 03/14/2022 CLINICAL DATA:  Abdominal pain, acute. EXAM: CT ABDOMEN AND PELVIS WITH CONTRAST TECHNIQUE: Multidetector CT imaging of the abdomen and pelvis was performed using the standard protocol following bolus administration of intravenous contrast. RADIATION DOSE REDUCTION: This exam was performed according to the departmental dose-optimization program which includes automated exposure control, adjustment of the mA and/or kV according to patient size and/or use of iterative reconstruction technique. CONTRAST:  38m OMNIPAQUE IOHEXOL 300 MG/ML  SOLN COMPARISON:  02/06/2022 and 03/05/2021 FINDINGS: Lower chest: There are multiple small lung nodules within both lung bases measuring up to 4 mm. These are unchanged from the previous exam from 03/05/2021. No pleural fluid or airspace disease. Hepatobiliary: Subjective hepatic steatosis. No suspicious liver lesion. Gallbladder appears within normal limits. No bile duct dilatation. Pancreas: Unremarkable. No pancreatic  ductal dilatation or surrounding inflammatory changes. Spleen: Normal in size without focal abnormality. Adrenals/Urinary Tract: Normal adrenal glands. No nephrolithiasis, hydronephrosis or kidney mass. Urinary bladder appears within normal limits. Stomach/Bowel: Stomach appears normal. The appendix is visualized and appears normal. No bowel wall thickening, inflammation or distension. Liquid stool noted throughout the colon. Vascular/Lymphatic: Age advanced aortic atherosclerosis. No signs of abdominopelvic adenopathy. Reproductive: The uterus is diminutive or surgically absent. Unremarkable appearance of the ovaries which appears stable from prior study. Other: No free fluid or fluid collection. No signs of pneumoperitoneum. Musculoskeletal: No acute or significant osseous findings. IMPRESSION: 1. No acute findings within the abdomen or pelvis. 2. Liquid  stool noted throughout the colon. Correlate for any clinical signs or symptoms of diarrhea. 3. Multiple small lung nodules within both lung bases measuring up to 4 mm. These are unchanged from 03/05/2021 compatible with a benign process. 4. Subjective hepatic steatosis. 5.  Aortic Atherosclerosis (ICD10-I70.0). Electronically Signed   By: Kerby Moors M.D.   On: 03/14/2022 06:14   DG Chest Portable 1 View  Result Date: 03/14/2022 CLINICAL DATA:  URI and sob EXAM: PORTABLE CHEST 1 VIEW COMPARISON:  Chest x-ray 03/04/2022 FINDINGS: The heart and mediastinal contours are unchanged. CardiaoMEMS noted overlying the left chest. Question left upper lobe airspace opacity. No pulmonary edema. No pleural effusion. No pneumothorax. No acute osseous abnormality. IMPRESSION: Question left upper lobe airspace opacity. Correlate with possible overlying cardiac leads. Finding may represent a true pulmonary finding such as infection or nodule. Followup PA and lateral chest X-ray is recommended in 3-4 weeks following therapy to ensure resolution. Electronically Signed   By:  Iven Finn M.D.   On: 03/14/2022 03:23        Scheduled Meds:  aspirin EC  81 mg Oral Daily   dexamethasone (DECADRON) injection  6 mg Intravenous Q24H   DULoxetine  30 mg Oral QHS   heparin  5,000 Units Subcutaneous Q8H   insulin aspart  0-15 Units Subcutaneous TID WC   insulin aspart  0-5 Units Subcutaneous QHS   insulin glargine-yfgn  60 Units Subcutaneous BID   ivabradine  7.5 mg Oral BID WC   levothyroxine  125 mcg Oral Q0600   pregabalin  150 mg Oral QHS   rosuvastatin  40 mg Oral Daily   sodium bicarbonate  650 mg Oral BID   sodium chloride flush  3 mL Intravenous Q12H   Continuous Infusions:  remdesivir 100 mg in sodium chloride 0.9 % 100 mL IVPB 100 mg (03/15/22 1040)     LOS: 0 days    Time spent: 45 minutes spent on chart review, discussion with nursing staff, consultants, updating family and interview/physical exam; more than 50% of that time was spent in counseling and/or coordination of care.    Geradine Girt, DO [facility] Available via Epic secure chat 7am-7pm After these hours, please refer to coverage provider listed on [email or web address] 03/15/2022, 12:53 PM

## 2022-03-15 NOTE — Plan of Care (Signed)

## 2022-03-15 NOTE — Inpatient Diabetes Management (Signed)
Inpatient Diabetes Program Recommendations  AACE/ADA: New Consensus Statement on Inpatient Glycemic Control (2015)  Target Ranges:  Prepandial:   less than 140 mg/dL      Peak postprandial:   less than 180 mg/dL (1-2 hours)      Critically ill patients:  140 - 180 mg/dL   Lab Results  Component Value Date   GLUCAP 338 (H) 03/15/2022   HGBA1C 8.5 (H) 02/13/2022    Review of Glycemic Control  Diabetes history: type 3C (per [facility] endo) Outpatient Diabetes medications: U-500 insulin 300 units TID, 120 units at HS Current orders for Inpatient glycemic control: Semglee 60 units BID, Novolog 0-20 units TID & HS scale  Inpatient Diabetes Program Recommendations:   Noted that blood sugars have been greater than 300 mg/dl. Patient is on steroids for COVID.  Recommend continuing current orders: Semglee 60 units BID, Novolog 0-20 units TID, Novolog 0-5 units at HS, and add Novolog 15 units TID with meals if eating at least 50% of meal and while on steroids. May need to consider increasing Semglee if blood sugars continue to be elevated. Titrate all dosages as needed.   Harvel Ricks RN BSN CDE Diabetes Coordinator Pager: [phone]  8am-5pm

## 2022-03-16 DIAGNOSIS — U071 COVID-19: Secondary | ICD-10-CM | POA: Diagnosis not present

## 2022-03-16 LAB — CBC
HCT: 27.5 % — ABNORMAL LOW (ref 36.0–46.0)
Hemoglobin: 9.1 g/dL — ABNORMAL LOW (ref 12.0–15.0)
MCH: 28.3 pg (ref 26.0–34.0)
MCHC: 33.1 g/dL (ref 30.0–36.0)
MCV: 85.7 fL (ref 80.0–100.0)
Platelets: 165 10*3/uL (ref 150–400)
RBC: 3.21 MIL/uL — ABNORMAL LOW (ref 3.87–5.11)
RDW: 16.1 % — ABNORMAL HIGH (ref 11.5–15.5)
WBC: 4.6 10*3/uL (ref 4.0–10.5)
nRBC: 0 % (ref 0.0–0.2)

## 2022-03-16 LAB — GLUCOSE, CAPILLARY
Glucose-Capillary: 69 mg/dL — ABNORMAL LOW (ref 70–99)
Glucose-Capillary: 81 mg/dL (ref 70–99)
Glucose-Capillary: 94 mg/dL (ref 70–99)
Glucose-Capillary: 59 mg/dL — ABNORMAL LOW (ref 70–99)
Glucose-Capillary: 105 mg/dL — ABNORMAL HIGH (ref 70–99)
Glucose-Capillary: 99 mg/dL (ref 70–99)

## 2022-03-16 LAB — BASIC METABOLIC PANEL
Anion gap: 12 (ref 5–15)
BUN: 66 mg/dL — ABNORMAL HIGH (ref 6–20)
CO2: 20 mmol/L — ABNORMAL LOW (ref 22–32)
Calcium: 9.1 mg/dL (ref 8.9–10.3)
Chloride: 103 mmol/L (ref 98–111)
Creatinine, Ser: 3.21 mg/dL — ABNORMAL HIGH (ref 0.44–1.00)
GFR, Estimated: 19 mL/min — ABNORMAL LOW (ref >60)
Glucose, Bld: 94 mg/dL (ref 70–99)
Potassium: 3.9 mmol/L (ref 3.5–5.1)
Sodium: 135 mmol/L (ref 135–145)

## 2022-03-16 MED ORDER — INSULIN GLARGINE-YFGN 100 UNIT/ML ~~LOC~~ SOLN
40.0000 [IU] | Freq: Two times a day (BID) | SUBCUTANEOUS | Status: DC
  Administered 2022-03-16 – 2022-03-18 (×4): 40 [IU] via SUBCUTANEOUS
  Filled 2022-03-16 (×5): qty 0.4

## 2022-03-16 MED ORDER — LORAZEPAM 2 MG/ML IJ SOLN: 0.2500 mg | Freq: Once | INTRAMUSCULAR | Status: DC

## 2022-03-16 MED ORDER — DICYCLOMINE HCL 10 MG PO CAPS
10.0000 mg | ORAL_CAPSULE | Freq: Three times a day (TID) | ORAL | Status: DC
  Administered 2022-03-16 – 2022-03-18 (×7): 10 mg via ORAL
  Filled 2022-03-16 (×10): qty 1

## 2022-03-16 MED ORDER — DEXTROSE-NACL 5-0.9 % IV SOLN: INTRAVENOUS | Status: AC

## 2022-03-16 MED ORDER — LOPERAMIDE HCL 2 MG PO CAPS
2.0000 mg | ORAL_CAPSULE | Freq: Once | ORAL | Status: DC
  Filled 2022-03-16: qty 1

## 2022-03-16 MED ORDER — LORAZEPAM 2 MG/ML IJ SOLN
0.2500 mg | INTRAMUSCULAR | Status: AC | PRN
  Administered 2022-03-17: 0.25 mg via INTRAVENOUS
  Filled 2022-03-16: qty 1

## 2022-03-16 MED ORDER — LACTATED RINGERS IV SOLN: INTRAVENOUS | Status: DC

## 2022-03-16 NOTE — Progress Notes (Signed)
PROGRESS NOTE    Jennifer [name]  GOT:[id number] DOB: 12-12-1990 DOA: 03/14/2022 PCP: Sue Lush, PA-C   Brief Narrative:  Jennifer [name] is a 31 y.o. 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   Patient reports that she was exposed to someone with an upper respiratory illness and then, 2 days ago, developed aches, malaise, sore throat, cough, congestion, shortness of breath.  She has also been experiencing upper abdominal discomfort with loose stool and nausea.  She denies chest pain or leg swelling, notes that she usually accumulates her excess fluid in her abdomen, but does not feel that she is hypervolemic at this time.  Assessment & Plan:   Principal Problem:   COVID-19 virus infection Active Problems:   Type 2 diabetes mellitus with hyperlipidemia (HCC)   Hypothyroidism   Essential hypertension   Nonischemic cardiomyopathy (HCC)   CKD (chronic kidney disease) stage 4, GFR 15-29 ml/min (HCC)   Hyponatremia   Positive D dimer   Stage 3b chronic kidney disease (CKD) ([facility])   COVID   COVID-19; acute hypoxic respiratory failure  - Present with 2 days of aches, cough, and SOB and is found to have COVID-19 and new 2 Lpm supplemental O2 requirement.  Overall improving but he still complains of wheezing, does have wheezing on exam as well but not hypoxic anymore.  Continue current management with Decadron, remdesivir and bronchodilators.  Patient was encouraged to prone, out of bed to chair, to use incentive spirometry and flutter valve.   NICM/history of CAD - She has NICM with recovered EF   -  monitor weight and I/Os   -Continue to hold torosemide with her AKI.  Continue aspirin and statin.  History of  single-vessel CAD.   Hyponatremia  - Serum sodium is 126 on admission, now resolved.   AKI  on CKD IV (patient says she is stage IV) -Her baseline creatinine appears to be ranging from 1.8-1.5, SCr is 1.87 on admission,, she received contrast media, her creatinine is rising and currently 3.21.  Continue to avoid nephrotoxic agents, I will start her on gentle hydration with normal saline at 50 cc/h. -follows with nephrology at [facility] (last Cr 11/30- 2 at [facility])   Elevated LFTs [name] asymptomatic.  Will repeat CMP tomorrow morning.   IDDM  - A1c was 8.5% in November 2023, she takes Lantus 60 units twice daily as well as Novolin R at home.  She was on 60 units twice daily Lantus, she was hypoglycemic, reducing her Lantus to 40 units twice daily and continue SSI.   Hypothyroidism  - Continue Synthroid      D-dimer elevated  - D-dimer is 0.83 in ED, likely from acute COVID-19 infection but looking back in the chart, this is likely her baseline, patient is improving, doubt PE.   Obesity: Estimated body mass index is 31.38 kg/m as calculated from the following:   Height as of 02/06/22: 4' 9"  (1.448 m).   Weight as of this encounter: 65.8 kg Weight loss and dietary modification counseled.  Abdominal pain: Patient complains of stomach upset and she is requesting IV opioids.  She was counseled that IV opioids are not indicated for stomachache, Bentyl has been ordered.  Abdomen is benign on exam.  DVT prophylaxis: heparin injection 5,000  Units Start: 03/14/22 2315   Code Status: Full Code  Family Communication:  None present at bedside.  Plan of care discussed with patient in length and he/she verbalized understanding and agreed with it.  Status is: Inpatient Remains inpatient appropriate because: Rising creatinine.   Estimated body mass index is 31.38 kg/m as calculated from the following:   Height as of 02/06/22: 4' 9"  (1.448 m).   Weight as of this encounter: 65.8 kg.    Nutritional  Assessment: Body mass index is 31.38 kg/m.Marland Kitchen Seen by dietician.  I agree with the assessment and plan as outlined below: Nutrition Status:        . Skin Assessment: I have examined the patient's skin and I agree with the wound assessment as performed by the wound care RN as outlined below:    Consultants:  None  Procedures:  None  Antimicrobials:  Anti-infectives (From admission, onward)    Start     Dose/Rate Route Frequency Ordered Stop   03/15/22 1000  remdesivir 100 mg in sodium chloride 0.9 % 100 mL IVPB       See Hyperspace for full Linked Orders Report.   100 mg 200 mL/hr over 30 Minutes Intravenous Daily 03/14/22 2224 03/17/22 0959   03/14/22 2330  remdesivir 200 mg in sodium chloride 0.9% 250 mL IVPB       See Hyperspace for full Linked Orders Report.   200 mg 580 mL/hr over 30 Minutes Intravenous Once 03/14/22 2224 03/15/22 0139         Subjective: Patient seen and examined.  She complains of wheezing, headache, sore throat and stomach upset.  Objective: Vitals:   03/15/22 0744 03/15/22 1243 03/15/22 2143 03/16/22 0856  BP: (!) 139/92 130/83 128/78   Pulse: (!) 111 (!) 103 93 96  Resp: 19 18 18 20   Temp: 98 F (36.7 C) 98 F (36.7 C) 98.4 F (36.9 C) 98 F (36.7 C)  TempSrc: Oral Oral Oral Oral  SpO2: 95% 96% 95% 97%  Weight:        Intake/Output Summary (Last 24 hours) at 03/16/2022 1105 Last data filed at 03/16/2022 0429 Gross per 24 hour  Intake 412.83 ml  Output --  Net 412.83 ml   Filed Weights   03/14/22 0236  Weight: 65.8 kg    Examination:  General exam: Appears calm and comfortable  Respiratory system: Scattered expiratory wheezes bilaterally, respiratory effort normal. Cardiovascular system: S1 & S2 heard, RRR. No JVD, murmurs, rubs, gallops or clicks. No pedal edema. Gastrointestinal system: Abdomen is nondistended, soft and nontender. No organomegaly or masses felt. Normal bowel sounds heard. Central nervous system: Alert  and oriented. No focal neurological deficits. Extremities: Symmetric 5 x 5 power. Skin: No rashes, lesions or ulcers Psychiatry: Judgement and insight appear normal. Mood & affect appropriate.    Data Reviewed: I have personally reviewed following labs and imaging studies  CBC: Recent Labs  Lab 03/14/22 0256 03/15/22 0353 03/16/22 0625  WBC 8.6 5.2 4.6  NEUTROABS 7.0 4.1  --   HGB 11.6* 9.6* 9.1*  HCT 33.6* 30.8* 27.5*  MCV 85.3 89.3 85.7  PLT 207 142* 790   Basic Metabolic Panel: Recent Labs  Lab 03/14/22 0256 03/15/22 0353 03/16/22 0625  NA 126* 131* 135  K 3.8 5.0 3.9  CL 91* 101 103  CO2 19* 15* 20*  GLUCOSE 162* 332* 94  BUN 49* 48* 66*  CREATININE 1.87* 2.48* 3.21*  CALCIUM 9.6 8.5* 9.1  MG  --  1.7  --   PHOS  --  3.6  --    GFR: Estimated Creatinine Clearance: 20 mL/min (A) (by C-G formula based on SCr of 3.21 mg/dL (H)). Liver Function Tests: Recent Labs  Lab 03/14/22 0614 03/14/22 0621 03/15/22 0353  AST 52* 65* 124*  ALT 46* 50* 52*  ALKPHOS 108 110 106  BILITOT 0.6 0.7 0.8  PROT 7.1 7.0 6.9  ALBUMIN 4.1 3.6 3.4*   Recent Labs  Lab 03/14/22 0614  LIPASE 23   No results for input(s): "AMMONIA" in the last 168 hours. Coagulation Profile: No results for input(s): "INR", "PROTIME" in the last 168 hours. Cardiac Enzymes: No results for input(s): "CKTOTAL", "CKMB", "CKMBINDEX", "TROPONINI" in the last 168 hours. BNP (last 3 results) No results for input(s): "PROBNP" in the last 8760 hours. HbA1C: No results for input(s): "HGBA1C" in the last 72 hours. CBG: Recent Labs  Lab 03/15/22 2146 03/15/22 2243 03/15/22 2344 03/16/22 0246 03/16/22 0844  GLUCAP 62* 64* 61* 69* 81   Lipid Profile: No results for input(s): "CHOL", "HDL", "LDLCALC", "TRIG", "CHOLHDL", "LDLDIRECT" in the last 72 hours. Thyroid Function Tests: No results for input(s): "TSH", "T4TOTAL", "FREET4", "T3FREE", "THYROIDAB" in the last 72 hours. Anemia Panel: No results  for input(s): "VITAMINB12", "FOLATE", "FERRITIN", "TIBC", "IRON", "RETICCTPCT" in the last 72 hours. Sepsis Labs: Recent Labs  Lab 03/14/22 0256 03/14/22 0614  LATICACIDVEN 2.4* 1.4    Recent Results (from the past 240 hour(s))  Resp panel by RT-PCR (RSV, Flu A&B, Covid) Anterior Nasal Swab     Status: Abnormal   Collection Time: 03/14/22  2:42 AM   Specimen: Anterior Nasal Swab  Result Value Ref Range Status   SARS Coronavirus 2 by RT PCR POSITIVE (A) NEGATIVE Final    Comment: (NOTE) SARS-CoV-2 target nucleic acids are DETECTED.  The SARS-CoV-2 RNA is generally detectable in upper respiratory specimens during the acute phase of infection. Positive results are indicative of the presence of the identified virus, but do not rule out bacterial infection or co-infection with other pathogens not detected by the test. Clinical correlation with patient history and other diagnostic information is necessary to determine patient infection status. The expected result is Negative.  Fact Sheet for Patients: EntrepreneurPulse.com.au  Fact Sheet for Healthcare Providers: IncredibleEmployment.be  This test is not yet approved or cleared by the [place] FDA and  has been authorized for detection and/or diagnosis of SARS-CoV-2 by FDA under an Emergency Use Authorization (EUA).  This EUA will remain in effect (meaning this test can be used) for the duration of  the COVID-19 declaration under Section 564(b)(1) of the A ct, 21 U.S.C. section 360bbb-3(b)(1), unless the authorization is terminated or revoked sooner.     Influenza A by PCR NEGATIVE NEGATIVE Final   Influenza B by PCR NEGATIVE NEGATIVE Final    Comment: (NOTE) The Xpert Xpress SARS-CoV-2/FLU/RSV plus assay is intended as an aid in the diagnosis of influenza from Nasopharyngeal swab specimens and should not be used as a sole basis for treatment. Nasal washings and aspirates are  unacceptable for Xpert Xpress SARS-CoV-2/FLU/RSV testing.  Fact Sheet for Patients: EntrepreneurPulse.com.au  Fact Sheet for Healthcare Providers: IncredibleEmployment.be  This test is not yet approved or cleared by the [place] FDA and has been authorized for detection and/or diagnosis of SARS-CoV-2 by FDA under an Emergency Use Authorization (EUA). This EUA will remain in effect (meaning this test can be used) for the duration of the COVID-19 declaration under Section 564(b)(1) of  the Act, 21 U.S.C. section 360bbb-3(b)(1), unless the authorization is terminated or revoked.     Resp Syncytial Virus by PCR NEGATIVE NEGATIVE Final    Comment: (NOTE) Fact Sheet for Patients: EntrepreneurPulse.com.au  Fact Sheet for Healthcare Providers: IncredibleEmployment.be  This test is not yet approved or cleared by the [place] FDA and has been authorized for detection and/or diagnosis of SARS-CoV-2 by FDA under an Emergency Use Authorization (EUA). This EUA will remain in effect (meaning this test can be used) for the duration of the COVID-19 declaration under Section 564(b)(1) of the Act, 21 U.S.C. section 360bbb-3(b)(1), unless the authorization is terminated or revoked.  Performed at [facility], [address] [place], [place] [address]   Group A Strep by PCR     Status: None   Collection Time: 03/14/22  2:42 AM   Specimen: Anterior Nasal Swab; Sterile Swab  Result Value Ref Range Status   Group A Strep by PCR NOT DETECTED NOT DETECTED Final    Comment: Performed at [facility], [address] [place], [place] [address]     Radiology Studies: No results found.  Scheduled Meds:  aspirin EC  81 mg Oral Daily   dexamethasone (DECADRON) injection  6 mg Intravenous Q24H   dicyclomine  10 mg Oral TID AC & HS   DULoxetine  30 mg Oral QHS   fenofibrate  160  mg Oral Daily   heparin  5,000 Units Subcutaneous Q8H   insulin aspart  0-20 Units Subcutaneous TID WC   insulin aspart  0-5 Units Subcutaneous QHS   insulin aspart  15 Units Subcutaneous TID WC   insulin glargine-yfgn  40 Units Subcutaneous BID   ivabradine  7.5 mg Oral BID WC   levothyroxine  125 mcg Oral Q0600   lipase/protease/amylase  36,000 Units Oral TID   loperamide  2 mg Oral Once   pregabalin  150 mg Oral QHS   rosuvastatin  40 mg Oral Daily   sildenafil  20 mg Oral TID   sodium bicarbonate  650 mg Oral BID   sodium chloride flush  3 mL Intravenous Q12H   Vitamin D (Ergocalciferol)  50,000 Units Oral Q Wed   Continuous Infusions:  dextrose 5 % and 0.9% NaCl 25 mL/hr at 03/16/22 0127   lactated ringers     remdesivir 100 mg in sodium chloride 0.9 % 100 mL IVPB Stopped (03/15/22 1113)     LOS: 1 day   Darliss Cheney, MD [facility]  03/16/2022, 11:05 AM   *Please note that this is a verbal dictation therefore any spelling or grammatical errors are due to the "[place] [employer]" system interpretation.  Please page via [place] and do not message via secure chat for urgent patient care matters. Secure chat can be used for non urgent patient care matters.  How to contact the [facility] Attending or Consulting provider [place] or covering provider during after hours [place], for this patient?  Check the care team in [facility] and look for a) attending/consulting TR[facility] provider listed and b) the [facility] team listed. Page or secure chat [phone]. Log into [email or web address] and use [place]'s universal password to access. If you do not have the password, please contact the hospital operator. Locate the [facility] provider you are looking for under [facility] and page to a number that you can be directly reached. If you still have difficulty reaching the provider, please page the [facility] (Director on Call) for the Hospitalists listed  on amion for assistance.

## 2022-03-16 NOTE — Plan of Care (Signed)
  Problem: Education: Goal: Knowledge of General Education information will improve Description: Including pain rating scale, medication(s)/side effects and non-pharmacologic comfort measures Outcome: Not Progressing   Problem: Health Behavior/Discharge Planning: Goal: Ability to manage health-related needs will improve Outcome: Not Progressing   Problem: Clinical Measurements: Goal: Ability to maintain clinical measurements within normal limits will improve Outcome: Not Progressing Goal: Will remain free from infection Outcome: Not Progressing Goal: Diagnostic test results will improve Outcome: Not Progressing Goal: Respiratory complications will improve Outcome: Not Progressing Goal: Cardiovascular complication will be avoided Outcome: Not Progressing   Problem: Activity: Goal: Risk for activity intolerance will decrease Outcome: Not Progressing   Problem: Nutrition: Goal: Adequate nutrition will be maintained Outcome: Not Progressing   Problem: Coping: Goal: Level of anxiety will decrease Outcome: Not Progressing   Problem: Elimination: Goal: Will not experience complications related to bowel motility Outcome: Not Progressing Goal: Will not experience complications related to urinary retention Outcome: Not Progressing   Problem: Fluid Volume: Goal: Ability to maintain a balanced intake and output will improve Outcome: Not Progressing   Problem: Health Behavior/Discharge Planning: Goal: Ability to identify and utilize available resources and services will improve Outcome: Not Progressing Goal: Ability to manage health-related needs will improve Outcome: Not Progressing   Problem: Nutritional: Goal: Maintenance of adequate nutrition will improve Outcome: Not Progressing Goal: Progress toward achieving an optimal weight will improve Outcome: Not Progressing   Problem: Skin Integrity: Goal: Risk for impaired skin integrity will decrease Outcome: Not  Progressing   Problem: Tissue Perfusion: Goal: Adequacy of tissue perfusion will improve Outcome: Not Progressing

## 2022-03-16 NOTE — Inpatient Diabetes Management (Signed)
Inpatient Diabetes Program Recommendations  AACE/ADA: New Consensus Statement on Inpatient Glycemic Control (2015)  Target Ranges:  Prepandial:   less than 140 mg/dL      Peak postprandial:   less than 180 mg/dL (1-2 hours)      Critically ill patients:  140 - 180 mg/dL   Lab Results  Component Value Date   GLUCAP 81 03/16/2022   HGBA1C 8.5 (H) 02/13/2022    Review of Glycemic Control  Latest Reference Range & Units 03/15/22 21:46 03/15/22 22:43 03/15/22 23:44 03/16/22 02:46 03/16/22 08:44  Glucose-Capillary 70 - 99 mg/dL 62 (L) 64 (L) 61 (L) 69 (L) 81  (L): Data is abnormally low Diabetes history: type 3C (per [facility] endo) Outpatient Diabetes medications: U-500 insulin 300 units TID, 120 units at HS Current orders for Inpatient glycemic control: Semglee 60 units BID, Novolog 0-20 units TID & HS scale Decadron 6 mg QD  Inpatient Diabetes Program Recommendations:    Noted hypoglycemia following doses of Novolog. Of note, two doses of correction scale was given within four hours which increases risk for hypoglycemia.   Consider reducing meal coverage to Novolog 10 units TID (assuming patient consuming >50% of meals).   Thanks, Bronson Curb, MSN, RNC-OB Diabetes Coordinator [phone] (8a-5p)

## 2022-03-17 DIAGNOSIS — U071 COVID-19: Secondary | ICD-10-CM | POA: Diagnosis not present

## 2022-03-17 LAB — BASIC METABOLIC PANEL
Anion gap: 16 — ABNORMAL HIGH (ref 5–15)
BUN: 62 mg/dL — ABNORMAL HIGH (ref 6–20)
CO2: 15 mmol/L — ABNORMAL LOW (ref 22–32)
Calcium: 9.4 mg/dL (ref 8.9–10.3)
Chloride: 102 mmol/L (ref 98–111)
Creatinine, Ser: 2.8 mg/dL — ABNORMAL HIGH (ref 0.44–1.00)
GFR, Estimated: 23 mL/min — ABNORMAL LOW (ref >60)
Glucose, Bld: 127 mg/dL — ABNORMAL HIGH (ref 70–99)
Potassium: 4.1 mmol/L (ref 3.5–5.1)
Sodium: 133 mmol/L — ABNORMAL LOW (ref 135–145)

## 2022-03-17 LAB — GLUCOSE, CAPILLARY
Glucose-Capillary: 137 mg/dL — ABNORMAL HIGH (ref 70–99)
Glucose-Capillary: 143 mg/dL — ABNORMAL HIGH (ref 70–99)
Glucose-Capillary: 175 mg/dL — ABNORMAL HIGH (ref 70–99)
Glucose-Capillary: 290 mg/dL — ABNORMAL HIGH (ref 70–99)
Glucose-Capillary: 79 mg/dL (ref 70–99)

## 2022-03-17 MED ORDER — METHYLPREDNISOLONE SODIUM SUCC 40 MG IJ SOLR
40.0000 mg | Freq: Two times a day (BID) | INTRAMUSCULAR | Status: DC
  Administered 2022-03-17 – 2022-03-18 (×2): 40 mg via INTRAVENOUS
  Filled 2022-03-17 (×2): qty 1

## 2022-03-17 MED ORDER — FAMOTIDINE IN NACL 20-0.9 MG/50ML-% IV SOLN
20.0000 mg | Freq: Once | INTRAVENOUS | Status: AC
  Administered 2022-03-17: 20 mg via INTRAVENOUS
  Filled 2022-03-17: qty 50

## 2022-03-17 MED ORDER — STERILE WATER FOR INJECTION IV SOLN
INTRAVENOUS | Status: DC
  Filled 2022-03-17: qty 150
  Filled 2022-03-17: qty 1000

## 2022-03-17 MED ORDER — DIPHENHYDRAMINE HCL 50 MG/ML IJ SOLN
25.0000 mg | Freq: Once | INTRAMUSCULAR | Status: AC
  Administered 2022-03-17: 25 mg via INTRAVENOUS
  Filled 2022-03-17: qty 1

## 2022-03-17 MED ORDER — SODIUM CHLORIDE 0.9 % IV SOLN
12.5000 mg | Freq: Once | INTRAVENOUS | Status: AC
  Administered 2022-03-18: 12.5 mg via INTRAVENOUS
  Filled 2022-03-17: qty 12.5

## 2022-03-17 MED ORDER — DIPHENHYDRAMINE HCL 50 MG/ML IJ SOLN
12.5000 mg | Freq: Four times a day (QID) | INTRAMUSCULAR | Status: DC | PRN
  Administered 2022-03-17 – 2022-03-18 (×3): 12.5 mg via INTRAVENOUS
  Filled 2022-03-17 (×3): qty 1

## 2022-03-17 NOTE — Progress Notes (Signed)
PROGRESS NOTE    Jennifer [name]  XNT:[id number] DOB: [birth year] DOA: 03/14/2022 PCP: Sue Lush, PA-C   Brief Narrative:  Jennifer [name] is a 31 y.o. 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   Patient reports that she was exposed to someone with an upper respiratory illness and then, 2 days ago, developed aches, malaise, sore throat, cough, congestion, shortness of breath.  She has also been experiencing upper abdominal discomfort with loose stool and nausea.  She denies chest pain or leg swelling, notes that she usually accumulates her excess fluid in her abdomen, but does not feel that she is hypervolemic at this time.  Assessment & Plan:   Principal Problem:   COVID-19 virus infection Active Problems:   Type 2 diabetes mellitus with hyperlipidemia (HCC)   Hypothyroidism   Essential hypertension   Nonischemic cardiomyopathy (HCC)   CKD (chronic kidney disease) stage 4, GFR 15-29 ml/min (HCC)   Hyponatremia   Positive D dimer   Stage 3b chronic kidney disease (CKD) ([place])   COVID   COVID-19; acute hypoxic respiratory failure  - Present with 2 days of aches, cough, and SOB and is found to have COVID-19 and new 2 Lpm supplemental O2 requirement.  Overall improving but she still complains of wheezing, does have wheezing on exam as well but not hypoxic anymore.  Continue current management with remdesivir and bronchodilators.  She had hives/allergic reaction to something yesterday, she has listed allergy to steroids, Decadron is considered to be the culprit, I am not sure, she is wheezy, she needs some sort of steroids, we will try to give her Solu-Medrol and see how she does.  Patient was encouraged to prone, out  of bed to chair, to use incentive spirometry and flutter valve.   NICM/history of CAD - She has NICM with recovered EF   -  monitor weight and I/Os   -Continue to hold torosemide with her AKI.  Continue aspirin and statin.  History of single-vessel CAD.   Hyponatremia  - Serum sodium is 126 on admission, now resolved.   AKI  on CKD IV (patient says she is stage IV)/metabolic acidosis -Her baseline creatinine appears to be ranging from 1.8-1.5, SCr is 1.87 on admission,, she received contrast media, her creatinine rose to 3.21.  Now improved somewhat.  But CO2 low.  Will transition her to bicarb drip at 50 cc/h and keep 1 more day.  Repeat labs in the morning.  Continue to avoid nephrotoxic agents,    Elevated LFTs [name] asymptomatic.  Will repeat CMP tomorrow morning.   IDDM  - A1c was 8.5% in November 2023, she takes Lantus 60 units twice daily as well as Novolin R at home.  She was on 60 units twice daily Lantus, she was hypoglycemic, reducing her Lantus to 40 units twice daily and continue SSI.   Hypothyroidism  - Continue Synthroid      D-dimer elevated  - D-dimer is 0.83 in ED, likely from acute COVID-19 infection but looking back in the chart, this is likely her baseline, patient is improving, doubt PE.   Obesity: Estimated body mass index is 31.38 kg/m as calculated from the following:   Height as of 02/06/22: 4' 9"  (1.448 m).   Weight as of this encounter:  65.8 kg Weight loss and dietary modification counseled.  Abdominal pain: Resolved with Bentyl.  DVT prophylaxis: heparin injection 5,000 Units Start: 03/14/22 2315   Code Status: Full Code  Family Communication:  None present at bedside.  Plan of care discussed with patient in length and he/she verbalized understanding and agreed with it.  Status is: Inpatient Remains inpatient appropriate because: In creatinine but still wheezy.  Likely discharge tomorrow.   Estimated body mass index is 26.52 kg/m as calculated  from the following:   Height as of this encounter: 5' 2"  (1.575 m).   Weight as of this encounter: 65.8 kg.    Nutritional Assessment: Body mass index is 26.52 kg/m.Marland Kitchen Seen by dietician.  I agree with the assessment and plan as outlined below: Nutrition Status:        . Skin Assessment: I have examined the patient's skin and I agree with the wound assessment as performed by the wound care RN as outlined below:    Consultants:  None  Procedures:  None  Antimicrobials:  Anti-infectives (From admission, onward)    Start     Dose/Rate Route Frequency Ordered Stop   03/15/22 1000  remdesivir 100 mg in sodium chloride 0.9 % 100 mL IVPB       See Hyperspace for full Linked Orders Report.   100 mg 200 mL/hr over 30 Minutes Intravenous Daily 03/14/22 2224 03/16/22 1154   03/14/22 2330  remdesivir 200 mg in sodium chloride 0.9% 250 mL IVPB       See Hyperspace for full Linked Orders Report.   200 mg 580 mL/hr over 30 Minutes Intravenous Once 03/14/22 2224 03/15/22 0139         Subjective:  Patient seen and examined.  She states that overall she feels better but is still has some shortness of breath and she is concerned about wheezing. Objective: Vitals:   03/17/22 0553 03/17/22 0554 03/17/22 0555 03/17/22 0914  BP: 124/74  124/74 132/73  Pulse: (!) 101  (!) 101 94  Resp: (!) 24  (!) 24 20  Temp: 97.6 F (36.4 C)  97.6 F (36.4 C) 98 F (36.7 C)  TempSrc: Axillary  Oral Oral  SpO2:    98%  Weight:  65.8 kg 65.8 kg   Height:   5' 2"  (1.575 m)     Intake/Output Summary (Last 24 hours) at 03/17/2022 1059 Last data filed at 03/17/2022 0030 Gross per 24 hour  Intake 120 ml  Output --  Net 120 ml    Filed Weights   03/14/22 0236 03/17/22 0554 03/17/22 0555  Weight: 65.8 kg 65.8 kg 65.8 kg    Examination:  General exam: Appears calm and comfortable  Respiratory system: Diffuse expiratory wheezes bilaterally, respiratory effort normal. Cardiovascular system:  S1 & S2 heard, RRR. No JVD, murmurs, rubs, gallops or clicks. No pedal edema. Gastrointestinal system: Abdomen is nondistended, soft and nontender. No organomegaly or masses felt. Normal bowel sounds heard. Central nervous system: Alert and oriented. No focal neurological deficits. Extremities: Symmetric 5 x 5 power. Skin: No rashes, lesions or ulcers.  Psychiatry: Judgement and insight appear normal. Mood & affect appropriate.   Data Reviewed: I have personally reviewed following labs and imaging studies  CBC: Recent Labs  Lab 03/14/22 0256 03/15/22 0353 03/16/22 0625  WBC 8.6 5.2 4.6  NEUTROABS 7.0 4.1  --   HGB 11.6* 9.6* 9.1*  HCT 33.6* 30.8* 27.5*  MCV 85.3 89.3 85.7  PLT 207 142* 165  Basic Metabolic Panel: Recent Labs  Lab 03/14/22 0256 03/15/22 0353 03/16/22 0625 03/17/22 0444  NA 126* 131* 135 133*  K 3.8 5.0 3.9 4.1  CL 91* 101 103 102  CO2 19* 15* 20* 15*  GLUCOSE 162* 332* 94 127*  BUN 49* 48* 66* 62*  CREATININE 1.87* 2.48* 3.21* 2.80*  CALCIUM 9.6 8.5* 9.1 9.4  MG  --  1.7  --   --   PHOS  --  3.6  --   --     GFR: Estimated Creatinine Clearance: 26.2 mL/min (A) (by C-G formula based on SCr of 2.8 mg/dL (H)). Liver Function Tests: Recent Labs  Lab 03/14/22 0614 03/14/22 0621 03/15/22 0353  AST 52* 65* 124*  ALT 46* 50* 52*  ALKPHOS 108 110 106  BILITOT 0.6 0.7 0.8  PROT 7.1 7.0 6.9  ALBUMIN 4.1 3.6 3.4*    Recent Labs  Lab 03/14/22 0614  LIPASE 23    No results for input(s): "AMMONIA" in the last 168 hours. Coagulation Profile: No results for input(s): "INR", "PROTIME" in the last 168 hours. Cardiac Enzymes: No results for input(s): "CKTOTAL", "CKMB", "CKMBINDEX", "TROPONINI" in the last 168 hours. BNP (last 3 results) No results for input(s): "PROBNP" in the last 8760 hours. HbA1C: No results for input(s): "HGBA1C" in the last 72 hours. CBG: Recent Labs  Lab 03/16/22 1644 03/16/22 1842 03/16/22 2044 03/17/22 0010  03/17/22 0914  GLUCAP 59* 105* 99 79 175*    Lipid Profile: No results for input(s): "CHOL", "HDL", "LDLCALC", "TRIG", "CHOLHDL", "LDLDIRECT" in the last 72 hours. Thyroid Function Tests: No results for input(s): "TSH", "T4TOTAL", "FREET4", "T3FREE", "THYROIDAB" in the last 72 hours. Anemia Panel: No results for input(s): "VITAMINB12", "FOLATE", "FERRITIN", "TIBC", "IRON", "RETICCTPCT" in the last 72 hours. Sepsis Labs: Recent Labs  Lab 03/14/22 0256 03/14/22 0614  LATICACIDVEN 2.4* 1.4     Recent Results (from the past 240 hour(s))  Resp panel by RT-PCR (RSV, Flu A&B, Covid) Anterior Nasal Swab     Status: Abnormal   Collection Time: 03/14/22  2:42 AM   Specimen: Anterior Nasal Swab  Result Value Ref Range Status   SARS Coronavirus 2 by RT PCR POSITIVE (A) NEGATIVE Final    Comment: (NOTE) SARS-CoV-2 target nucleic acids are DETECTED.  The SARS-CoV-2 RNA is generally detectable in upper respiratory specimens during the acute phase of infection. Positive results are indicative of the presence of the identified virus, but do not rule out bacterial infection or co-infection with other pathogens not detected by the test. Clinical correlation with patient history and other diagnostic information is necessary to determine patient infection status. The expected result is Negative.  Fact Sheet for Patients: EntrepreneurPulse.com.au  Fact Sheet for Healthcare Providers: IncredibleEmployment.be  This test is not yet approved or cleared by the [place] FDA and  has been authorized for detection and/or diagnosis of SARS-CoV-2 by FDA under an Emergency Use Authorization (EUA).  This EUA will remain in effect (meaning this test can be used) for the duration of  the COVID-19 declaration under Section 564(b)(1) of the A ct, 21 U.S.C. section 360bbb-3(b)(1), unless the authorization is terminated or revoked sooner.     Influenza A by PCR  NEGATIVE NEGATIVE Final   Influenza B by PCR NEGATIVE NEGATIVE Final    Comment: (NOTE) The Xpert Xpress SARS-CoV-2/FLU/RSV plus assay is intended as an aid in the diagnosis of influenza from Nasopharyngeal swab specimens and should not be used as a sole basis for treatment.  Nasal washings and aspirates are unacceptable for Xpert Xpress SARS-CoV-2/FLU/RSV testing.  Fact Sheet for Patients: EntrepreneurPulse.com.au  Fact Sheet for Healthcare Providers: IncredibleEmployment.be  This test is not yet approved or cleared by the [place] FDA and has been authorized for detection and/or diagnosis of SARS-CoV-2 by FDA under an Emergency Use Authorization (EUA). This EUA will remain in effect (meaning this test can be used) for the duration of the COVID-19 declaration under Section 564(b)(1) of the Act, 21 U.S.C. section 360bbb-3(b)(1), unless the authorization is terminated or revoked.     Resp Syncytial Virus by PCR NEGATIVE NEGATIVE Final    Comment: (NOTE) Fact Sheet for Patients: EntrepreneurPulse.com.au  Fact Sheet for Healthcare Providers: IncredibleEmployment.be  This test is not yet approved or cleared by the [place] FDA and has been authorized for detection and/or diagnosis of SARS-CoV-2 by FDA under an Emergency Use Authorization (EUA). This EUA will remain in effect (meaning this test can be used) for the duration of the COVID-19 declaration under Section 564(b)(1) of the Act, 21 U.S.C. section 360bbb-3(b)(1), unless the authorization is terminated or revoked.  Performed at [facility], [address], [place], [place] [address]   Group A Strep by PCR     Status: None   Collection Time: 03/14/22  2:42 AM   Specimen: Anterior Nasal Swab; Sterile Swab  Result Value Ref Range Status   Group A Strep by PCR NOT DETECTED NOT DETECTED Final    Comment: Performed at [facility], [address], [place], [place] [address]     Radiology Studies: No results found.  Scheduled Meds:  aspirin EC  81 mg Oral Daily   dicyclomine  10 mg Oral TID AC & HS   DULoxetine  30 mg Oral QHS   fenofibrate  160 mg Oral Daily   heparin  5,000 Units Subcutaneous Q8H   insulin aspart  0-20 Units Subcutaneous TID WC   insulin aspart  0-5 Units Subcutaneous QHS   insulin aspart  15 Units Subcutaneous TID WC   insulin glargine-yfgn  40 Units Subcutaneous BID   ivabradine  7.5 mg Oral BID WC   levothyroxine  125 mcg Oral Q0600   lipase/protease/amylase  36,000 Units Oral TID   loperamide  2 mg Oral Once   methylPREDNISolone (SOLU-MEDROL) injection  40 mg Intravenous Q12H   pregabalin  150 mg Oral QHS   rosuvastatin  40 mg Oral Daily   sildenafil  20 mg Oral TID   sodium bicarbonate  650 mg Oral BID   sodium chloride flush  3 mL Intravenous Q12H   Vitamin D (Ergocalciferol)  50,000 Units Oral Q Wed   Continuous Infusions:  sodium bicarbonate 150 mEq in sterile water 1,150 mL infusion 75 mL/hr at 03/17/22 1054     LOS: 2 days   Darliss Cheney, MD [facility]  03/17/2022, 10:59 AM   *Please note that this is a verbal dictation therefore any spelling or grammatical errors are due to the "[place] [facility]" system interpretation.  Please page via [place] and do not message via secure chat for urgent patient care matters. Secure chat can be used for non urgent patient care matters.  How to contact the [facility] Attending or Consulting provider [place] or covering provider during after hours [place], for this patient?  Check the care team in [facility] and look for a) attending/consulting [facility] provider listed and b) the [facility] team listed. Page or secure chat [phone]. Log into [email or web address]  and use [place]'s universal password to access. If you do not have the password, please contact the hospital operator. Locate the [facility] provider you are looking for under [facility] and page to a number that you can be directly reached. If you still have difficulty reaching the provider, please page the [facility] (Director on Call) for the Hospitalists listed on amion for assistance.

## 2022-03-17 NOTE — Progress Notes (Signed)
Pt alert and oriented, respirations even and unlabored. C/O itching. Noted to have hive-like rash all over body, trunk, arms, legs, etc. Page to on call to inform. PRN benadryl 25 given. Does have mild wheeze, pt reports has been like that for days.

## 2022-03-17 NOTE — Progress Notes (Signed)
       CROSS COVER NOTE  NAME: Jennifer [name] MRN: [id number] DOB : 1990-07-10    Date of Service   03/17/2022   HPI/Events of Note   Notified by bedside RN of new onset " hive like" rash scattered over body, trunk, arms, legs.  IV Benadryl and Pepcid ordered.  No respiratory distress noted by RN.  However mild wheezing reported, per patient this is not new.  Albuterol inhaler was recommended.    New medication, IV Ativan was given earlier for nausea and vomiting as the patient has prolonged QTc.  Ativan was given at midnight, hives were noted closer to 5:30 am by the patient.   Interventions/ Plan   IV Benadryl, IV Pepcid Albuterol inhaler       Raenette Rover, DNP, [place]

## 2022-03-18 ENCOUNTER — Encounter (HOSPITAL_COMMUNITY): Payer: BC Managed Care – PPO

## 2022-03-18 DIAGNOSIS — U071 COVID-19: Secondary | ICD-10-CM | POA: Diagnosis not present

## 2022-03-18 LAB — COMPREHENSIVE METABOLIC PANEL
ALT: 37 U/L (ref 0–44)
AST: 44 U/L — ABNORMAL HIGH (ref 15–41)
Albumin: 3.4 g/dL — ABNORMAL LOW (ref 3.5–5.0)
Alkaline Phosphatase: 82 U/L (ref 38–126)
Anion gap: 13 (ref 5–15)
BUN: 52 mg/dL — ABNORMAL HIGH (ref 6–20)
CO2: 21 mmol/L — ABNORMAL LOW (ref 22–32)
Calcium: 9 mg/dL (ref 8.9–10.3)
Chloride: 103 mmol/L (ref 98–111)
Creatinine, Ser: 2.29 mg/dL — ABNORMAL HIGH (ref 0.44–1.00)
GFR, Estimated: 29 mL/min — ABNORMAL LOW (ref >60)
Glucose, Bld: 294 mg/dL — ABNORMAL HIGH (ref 70–99)
Potassium: 4.2 mmol/L (ref 3.5–5.1)
Sodium: 137 mmol/L (ref 135–145)
Total Bilirubin: 0.3 mg/dL (ref 0.3–1.2)
Total Protein: 6.7 g/dL (ref 6.5–8.1)

## 2022-03-18 LAB — GLUCOSE, CAPILLARY: Glucose-Capillary: 341 mg/dL — ABNORMAL HIGH (ref 70–99)

## 2022-03-18 NOTE — Discharge Summary (Signed)
Physician Discharge Summary  Jennifer [name] SMO:[id number] DOB: February 03, 1991 DOA: 03/14/2022  PCP: Sue Lush, PA-C  Admit date: 03/14/2022 Discharge date: 03/18/2022 30 Day Unplanned Readmission Risk Score    Flowsheet Row ED to Hosp-Admission (Current) from 03/14/2022 in [facility]  30 Day Unplanned Readmission Risk Score (%) 50.62 Filed at 03/18/2022 0801       This score is the patient's risk of an unplanned readmission within 30 days of being discharged (0 -100%). The score is based on dignosis, age, lab data, medications, orders, and past utilization.   Low:  0-14.9   Medium: 15-21.9   High: 22-29.9   Extreme: 30 and above          Admitted From: Home Disposition: Home  Recommendations for Outpatient Follow-up:  Follow up with PCP in 1-2 weeks Please obtain BMP/CBC in one week Please follow up with your PCP on the following pending results: Unresulted Labs (From admission, onward)    None         Home Health: None Equipment/Devices: None  Discharge Condition: Stable CODE STATUS: Full code Diet recommendation: Cardiac/low-sodium  Subjective: Seen and examined.  She feels much better today.  No shortness of breath.  [name] comfortable and ready to go home.  Brief/Interim Summary: Jennifer [name] is a 31 y.o. 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  She was diagnosed with acute hypoxic respiratory failure secondary to COVID-19 pneumonia.  She was started on remdesivir, dexamethasone as well as bronchodilators and incentive spirometry.  She was weaned down to room air.  She did have some wheezing yesterday but feels much better today and has not required  oxygen for last 2 to 3 days and has very minimal end expiratory wheezes.  She feels comfortable going home.  She completed course of remdesivir.  She did have allergic reaction to dexamethasone so we are not going to discharge her on any prednisone.   NICM/history of CAD - She has NICM with recovered EF   -  monitor weight and I/Os   Resume home medications.   Hyponatremia  - Serum sodium is 126 on admission, now resolved.   AKI  on CKD IV (patient says she is stage IV)/metabolic acidosis -Her baseline creatinine appears to be ranging from 1.9-2.2, SCr is 1.87 on admission,, she received contrast media, her creatinine rose to 3.21.  Now improved and down to 2.3 today.  She also had metabolic acidosis for which she received bicarb drip.  That has improved as well.     Elevated LFTs [name] asymptomatic.  LFTs improved.   IDDM  - A1c was 8.5% in November 2023, she takes Lantus 60 units twice daily as well as Novolin R at home.  Resume home medications.   Hypothyroidism  - Continue Synthroid      D-dimer elevated  - D-dimer is 0.83 in ED, likely from acute COVID-19 infection but looking back in the chart, this is likely her baseline, patient is improving, doubt PE.   Obesity: Estimated body mass index is 31.38 kg/m as calculated from the following:   Height as of 02/06/22: 4' 9"  (1.448 m).   Weight as of this encounter: 65.8 kg Weight loss and dietary modification counseled.   Abdominal pain:  Resolved with Bentyl.  Discharge plan was discussed with patient and/or family member and they verbalized understanding and agreed with it.  Discharge Diagnoses:  Principal Problem:   COVID-19 virus infection Active Problems:   Type 2 diabetes mellitus with hyperlipidemia (HCC)   Hypothyroidism   Essential hypertension   Nonischemic cardiomyopathy (HCC)   CKD (chronic kidney disease) stage 4, GFR 15-29 ml/min (HCC)   Hyponatremia   Positive D dimer   Stage 3b chronic kidney disease  (CKD) ([place])   COVID    Discharge Instructions   Allergies as of 03/18/2022       Reactions   Ketamine Other (See Comments)   In vegetative state for 15 minutes per pt   Erythromycin    unk   Fentanyl Nausea And Vomiting   Maitake Mushroom [maitake] Itching   Itchy throat   Penicillin V Itching   Gi upset   Shellfish Allergy Other (See Comments)   Pt has never had shellfish but tested positive on allergy test. Pt states contrast in CT is okay   Morphine Itching, Rash   Penicillins Rash   Has patient had a PCN reaction causing immediate rash, facial/tongue/throat swelling, SOB or lightheadedness with hypotension: Y Has patient had a PCN reaction causing severe rash involving mucus membranes or skin necrosis: Y Has patient had a PCN reaction that required hospitalization: N Has patient had a PCN reaction occurring within the last 10 years: Y If all of the above answers are "NO", then may proceed with Cephalosporin use.   Prednisone Rash        Medication List     TAKE these medications    albuterol 108 (90 Base) MCG/ACT inhaler Commonly known as: VENTOLIN HFA Inhale 1-2 puffs into the lungs every 6 (six) hours as needed for wheezing or shortness of breath.   aspirin EC 81 MG tablet Take 1 tablet (81 mg total) by mouth daily. Swallow whole.   carvedilol 3.125 MG tablet Commonly known as: COREG Take 1 tablet (3.125 mg total) by mouth 2 (two) times daily with a meal.   Creon 36000 UNITS Cpep capsule Generic drug: lipase/protease/amylase Take 36,000 Units by mouth 3 (three) times daily.   DULoxetine 30 MG capsule Commonly known as: CYMBALTA Take 30 mg by mouth at bedtime.   ergocalciferol 1.25 MG (50000 UT) capsule Commonly known as: VITAMIN D2 Take 50,000 Units by mouth once a week.   fenofibrate 160 MG tablet Take 1 tablet (160 mg total) by mouth daily.   HumuLIN R U-500 KwikPen 500 UNIT/ML KwikPen Generic drug: insulin regular human CONCENTRATED Inject  120-300 Units into the skin See admin instructions. 300 units at every meal and 120 units at bedtime   insulin aspart 100 UNIT/ML FlexPen Commonly known as: NOVOLOG Inject 60 Units into the skin 3 (three) times daily with meals.   insulin aspart 100 UNIT/ML FlexPen Commonly known as: NOVOLOG For before meals and at bedtime Sliding scale: CBG 70 - 120: 0 units  CBG 121 - 150: 3 units  CBG 151 - 200: 4 units  CBG 201 - 250: 7 units  CBG 251 - 300: 11 units  CBG 301 - 350: 15 units  CBG 351 - 400: 20 units  CBG > 400: notify your doctor   insulin glargine 100 UNIT/ML Solostar Pen Commonly known as: LANTUS Inject 60 Units into the skin 2 (two) times daily.   Insulin Pen Needle 32G X 4 MM Misc 1 Device by Does not apply route  QID. For use with insulin pens   ivabradine 7.5 MG Tabs tablet Commonly known as: CORLANOR Take 1 tablet (7.5 mg total) by mouth 2 (two) times daily with a meal.   levocetirizine 5 MG tablet Commonly known as: XYZAL Take 5 mg by mouth daily.   levothyroxine 125 MCG tablet Commonly known as: SYNTHROID Take 1 tablet (125 mcg total) by mouth daily at 6 (six) AM.   loratadine 10 MG tablet Commonly known as: CLARITIN Take 10 mg by mouth daily.   ondansetron 4 MG tablet Commonly known as: ZOFRAN Take 4 mg by mouth every 8 (eight) hours as needed for nausea or vomiting.   pantoprazole 40 MG tablet Commonly known as: PROTONIX Take 40 mg by mouth daily.   pregabalin 150 MG capsule Commonly known as: LYRICA Take 1 capsule (150 mg total) by mouth at bedtime. What changed: how much to take   prochlorperazine 5 MG tablet Commonly known as: COMPAZINE Take 5 mg by mouth daily.   Repatha 140 MG/ML Sosy Generic drug: Evolocumab Inject 140 mg into the skin See admin instructions. Inject 140 mg subcutaneously every other Sunday evening   rosuvastatin 40 MG tablet Commonly known as: CRESTOR Take 40 mg by mouth daily.   sildenafil 20 MG tablet Commonly  known as: REVATIO Take 1 tablet (20 mg total) by mouth 3 (three) times daily.   sodium bicarbonate 650 MG tablet Take 650 mg by mouth 2 (two) times daily.   Torsemide 40 MG Tabs Take 40 mg by mouth daily.   tretinoin 0.05 % cream Commonly known as: RETIN-A Apply 1 application topically at bedtime.        Follow-up Information     Sue Lush, PA-C Follow up in 1 week(s).   Specialty: Physician Assistant Contact information: [place] [id number] [phone]                Allergies  Allergen Reactions   Ketamine Other (See Comments)    In vegetative state for 15 minutes per pt   Erythromycin     unk   Fentanyl Nausea And Vomiting   Maitake Mushroom [Maitake] Itching    Itchy throat   Penicillin V Itching    Gi upset   Shellfish Allergy Other (See Comments)    Pt has never had shellfish but tested positive on allergy test. Pt states contrast in CT is okay   Morphine Itching and Rash   Penicillins Rash    Has patient had a PCN reaction causing immediate rash, facial/tongue/throat swelling, SOB or lightheadedness with hypotension: Y Has patient had a PCN reaction causing severe rash involving mucus membranes or skin necrosis: Y Has patient had a PCN reaction that required hospitalization: N Has patient had a PCN reaction occurring within the last 10 years: Y If all of the above answers are "NO", then may proceed with Cephalosporin use.    Prednisone Rash    Consultations: None   Procedures/Studies: CT ABDOMEN PELVIS W CONTRAST  Result Date: 03/14/2022 CLINICAL DATA:  Abdominal pain, acute. EXAM: CT ABDOMEN AND PELVIS WITH CONTRAST TECHNIQUE: Multidetector CT imaging of the abdomen and pelvis was performed using the standard protocol following bolus administration of intravenous contrast. RADIATION DOSE REDUCTION: This exam was performed according to the departmental dose-optimization program which includes automated  exposure control, adjustment of the mA and/or kV according to patient size and/or use of iterative reconstruction technique. CONTRAST:  33m OMNIPAQUE IOHEXOL 300 MG/ML  SOLN  COMPARISON:  02/06/2022 and 03/05/2021 FINDINGS: Lower chest: There are multiple small lung nodules within both lung bases measuring up to 4 mm. These are unchanged from the previous exam from 03/05/2021. No pleural fluid or airspace disease. Hepatobiliary: Subjective hepatic steatosis. No suspicious liver lesion. Gallbladder appears within normal limits. No bile duct dilatation. Pancreas: Unremarkable. No pancreatic ductal dilatation or surrounding inflammatory changes. Spleen: Normal in size without focal abnormality. Adrenals/Urinary Tract: Normal adrenal glands. No nephrolithiasis, hydronephrosis or kidney mass. Urinary bladder appears within normal limits. Stomach/Bowel: Stomach appears normal. The appendix is visualized and appears normal. No bowel wall thickening, inflammation or distension. Liquid stool noted throughout the colon. Vascular/Lymphatic: Age advanced aortic atherosclerosis. No signs of abdominopelvic adenopathy. Reproductive: The uterus is diminutive or surgically absent. Unremarkable appearance of the ovaries which appears stable from prior study. Other: No free fluid or fluid collection. No signs of pneumoperitoneum. Musculoskeletal: No acute or significant osseous findings. IMPRESSION: 1. No acute findings within the abdomen or pelvis. 2. Liquid stool noted throughout the colon. Correlate for any clinical signs or symptoms of diarrhea. 3. Multiple small lung nodules within both lung bases measuring up to 4 mm. These are unchanged from 03/05/2021 compatible with a benign process. 4. Subjective hepatic steatosis. 5.  Aortic Atherosclerosis (ICD10-I70.0). Electronically Signed   By: Kerby Moors M.D.   On: 03/14/2022 06:14   DG Chest Portable 1 View  Result Date: 03/14/2022 CLINICAL DATA:  URI and sob EXAM: PORTABLE  CHEST 1 VIEW COMPARISON:  Chest x-ray 03/04/2022 FINDINGS: The heart and mediastinal contours are unchanged. CardiaoMEMS noted overlying the left chest. Question left upper lobe airspace opacity. No pulmonary edema. No pleural effusion. No pneumothorax. No acute osseous abnormality. IMPRESSION: Question left upper lobe airspace opacity. Correlate with possible overlying cardiac leads. Finding may represent a true pulmonary finding such as infection or nodule. Followup PA and lateral chest X-ray is recommended in 3-4 weeks following therapy to ensure resolution. Electronically Signed   By: Iven Finn M.D.   On: 03/14/2022 03:23   DG Chest 2 View  Result Date: 03/04/2022 CLINICAL DATA:  10 pound weight gain in the last few days. Chest pain and shortness of breath. EXAM: CHEST - 2 VIEW COMPARISON:  None Available. FINDINGS: Cardiomems device is identified in the expected location of the left PA branch. There is mild cardiac enlargement, stable from prior exam. No pleural effusion identified. Pulmonary vascular congestion. No frank edema. No airspace disease. Visualized osseous structures are unremarkable. IMPRESSION: Mild cardiac enlargement and pulmonary vascular congestion. Electronically Signed   By: Kerby Moors M.D.   On: 03/04/2022 18:18     Discharge Exam: Vitals:   03/17/22 1509 03/17/22 2152  BP: 136/84 (!) 166/90  Pulse: 91   Resp: 18 12  Temp: 98.6 F (37 C) 98.6 F (37 C)  SpO2: 95% 97%   Vitals:   03/17/22 0555 03/17/22 0914 03/17/22 1509 03/17/22 2152  BP: 124/74 132/73 136/84 (!) 166/90  Pulse: (!) 101 94 91   Resp: (!) 24 20 18 12   Temp: 97.6 F (36.4 C) 98 F (36.7 C) 98.6 F (37 C) 98.6 F (37 C)  TempSrc: Oral Oral Oral Oral  SpO2:  98% 95% 97%  Weight: 65.8 kg     Height: 5' 2"  (1.575 m)       General: Pt is alert, awake, not in acute distress Cardiovascular: RRR, S1/S2 +, no rubs, no gallops Respiratory: Very mild end expiratory wheezes, no  rhonchi. Abdominal: Soft, NT,  ND, bowel sounds + Extremities: no edema, no cyanosis    The results of significant diagnostics from this hospitalization (including imaging, microbiology, ancillary and laboratory) are listed below for reference.     Microbiology: Recent Results (from the past 240 hour(s))  Resp panel by RT-PCR (RSV, Flu A&B, Covid) Anterior Nasal Swab     Status: Abnormal   Collection Time: 03/14/22  2:42 AM   Specimen: Anterior Nasal Swab  Result Value Ref Range Status   SARS Coronavirus 2 by RT PCR POSITIVE (A) NEGATIVE Final    Comment: (NOTE) SARS-CoV-2 target nucleic acids are DETECTED.  The SARS-CoV-2 RNA is generally detectable in upper respiratory specimens during the acute phase of infection. Positive results are indicative of the presence of the identified virus, but do not rule out bacterial infection or co-infection with other pathogens not detected by the test. Clinical correlation with patient history and other diagnostic information is necessary to determine patient infection status. The expected result is Negative.  Fact Sheet for Patients: EntrepreneurPulse.com.au  Fact Sheet for Healthcare Providers: IncredibleEmployment.be  This test is not yet approved or cleared by the [place] FDA and  has been authorized for detection and/or diagnosis of SARS-CoV-2 by FDA under an Emergency Use Authorization (EUA).  This EUA will remain in effect (meaning this test can be used) for the duration of  the COVID-19 declaration under Section 564(b)(1) of the A ct, 21 U.S.C. section 360bbb-3(b)(1), unless the authorization is terminated or revoked sooner.     Influenza A by PCR NEGATIVE NEGATIVE Final   Influenza B by PCR NEGATIVE NEGATIVE Final    Comment: (NOTE) The Xpert Xpress SARS-CoV-2/FLU/RSV plus assay is intended as an aid in the diagnosis of influenza from Nasopharyngeal swab specimens and should not be  used as a sole basis for treatment. Nasal washings and aspirates are unacceptable for Xpert Xpress SARS-CoV-2/FLU/RSV testing.  Fact Sheet for Patients: EntrepreneurPulse.com.au  Fact Sheet for Healthcare Providers: IncredibleEmployment.be  This test is not yet approved or cleared by the [place] FDA and has been authorized for detection and/or diagnosis of SARS-CoV-2 by FDA under an Emergency Use Authorization (EUA). This EUA will remain in effect (meaning this test can be used) for the duration of the COVID-19 declaration under Section 564(b)(1) of the Act, 21 U.S.C. section 360bbb-3(b)(1), unless the authorization is terminated or revoked.     Resp Syncytial Virus by PCR NEGATIVE NEGATIVE Final    Comment: (NOTE) Fact Sheet for Patients: EntrepreneurPulse.com.au  Fact Sheet for Healthcare Providers: IncredibleEmployment.be  This test is not yet approved or cleared by the [place] FDA and has been authorized for detection and/or diagnosis of SARS-CoV-2 by FDA under an Emergency Use Authorization (EUA). This EUA will remain in effect (meaning this test can be used) for the duration of the COVID-19 declaration under Section 564(b)(1) of the Act, 21 U.S.C. section 360bbb-3(b)(1), unless the authorization is terminated or revoked.  Performed at [facility], [address], [place], [place] [address]   Group A Strep by PCR     Status: None   Collection Time: 03/14/22  2:42 AM   Specimen: Anterior Nasal Swab; Sterile Swab  Result Value Ref Range Status   Group A Strep by PCR NOT DETECTED NOT DETECTED Final    Comment: Performed at [facility], [address], [place], [place] [address]     Labs: BNP (last 3 results) Recent Labs    02/03/22 1641 02/12/22 0713 03/04/22 1759  BNP  39.4 68.4 793.9*   Basic Metabolic Panel: Recent Labs  Lab  03/14/22 0256 03/15/22 0353 03/16/22 0625 03/17/22 0444 03/18/22 0318  NA 126* 131* 135 133* 137  K 3.8 5.0 3.9 4.1 4.2  CL 91* 101 103 102 103  CO2 19* 15* 20* 15* 21*  GLUCOSE 162* 332* 94 127* 294*  BUN 49* 48* 66* 62* 52*  CREATININE 1.87* 2.48* 3.21* 2.80* 2.29*  CALCIUM 9.6 8.5* 9.1 9.4 9.0  MG  --  1.7  --   --   --   PHOS  --  3.6  --   --   --    Liver Function Tests: Recent Labs  Lab 03/14/22 0614 03/14/22 0621 03/15/22 0353 03/18/22 0318  AST 52* 65* 124* 44*  ALT 46* 50* 52* 37  ALKPHOS 108 110 106 82  BILITOT 0.6 0.7 0.8 0.3  PROT 7.1 7.0 6.9 6.7  ALBUMIN 4.1 3.6 3.4* 3.4*   Recent Labs  Lab 03/14/22 0614  LIPASE 23   No results for input(s): "AMMONIA" in the last 168 hours. CBC: Recent Labs  Lab 03/14/22 0256 03/15/22 0353 03/16/22 0625  WBC 8.6 5.2 4.6  NEUTROABS 7.0 4.1  --   HGB 11.6* 9.6* 9.1*  HCT 33.6* 30.8* 27.5*  MCV 85.3 89.3 85.7  PLT 207 142* 165   Cardiac Enzymes: No results for input(s): "CKTOTAL", "CKMB", "CKMBINDEX", "TROPONINI" in the last 168 hours. BNP: Invalid input(s): "POCBNP" CBG: Recent Labs  Lab 03/17/22 0914 03/17/22 1137 03/17/22 1545 03/17/22 2145 03/18/22 0757  GLUCAP 175* 137* 143* 290* 341*   D-Dimer No results for input(s): "DDIMER" in the last 72 hours. Hgb A1c No results for input(s): "HGBA1C" in the last 72 hours. Lipid Profile No results for input(s): "CHOL", "HDL", "LDLCALC", "TRIG", "CHOLHDL", "LDLDIRECT" in the last 72 hours. Thyroid function studies No results for input(s): "TSH", "T4TOTAL", "T3FREE", "THYROIDAB" in the last 72 hours.  Invalid input(s): "FREET3" Anemia work up No results for input(s): "VITAMINB12", "FOLATE", "FERRITIN", "TIBC", "IRON", "RETICCTPCT" in the last 72 hours. Urinalysis    Component Value Date/Time   COLORURINE YELLOW 02/05/2022 2240   APPEARANCEUR CLEAR 02/05/2022 2240   LABSPEC 1.013 02/05/2022 2240   PHURINE 5.0 02/05/2022 2240   GLUCOSEU 100 (A)  02/05/2022 2240   HGBUR NEGATIVE 02/05/2022 2240   BILIRUBINUR NEGATIVE 02/05/2022 2240   KETONESUR NEGATIVE 02/05/2022 2240   PROTEINUR TRACE (A) 02/05/2022 2240   NITRITE NEGATIVE 02/05/2022 2240   LEUKOCYTESUR NEGATIVE 02/05/2022 2240   Sepsis Labs Recent Labs  Lab 03/14/22 0256 03/15/22 0353 03/16/22 0625  WBC 8.6 5.2 4.6   Microbiology Recent Results (from the past 240 hour(s))  Resp panel by RT-PCR (RSV, Flu A&B, Covid) Anterior Nasal Swab     Status: Abnormal   Collection Time: 03/14/22  2:42 AM   Specimen: Anterior Nasal Swab  Result Value Ref Range Status   SARS Coronavirus 2 by RT PCR POSITIVE (A) NEGATIVE Final    Comment: (NOTE) SARS-CoV-2 target nucleic acids are DETECTED.  The SARS-CoV-2 RNA is generally detectable in upper respiratory specimens during the acute phase of infection. Positive results are indicative of the presence of the identified virus, but do not rule out bacterial infection or co-infection with other pathogens not detected by the test. Clinical correlation with patient history and other diagnostic information is necessary to determine patient infection status. The expected result is Negative.  Fact Sheet for Patients: EntrepreneurPulse.com.au  Fact Sheet for Healthcare Providers: IncredibleEmployment.be  This test is  not yet approved or cleared by the [place] and  has been authorized for detection and/or diagnosis of SARS-CoV-2 by FDA under an Emergency Use Authorization (EUA).  This EUA will remain in effect (meaning this test can be used) for the duration of  the COVID-19 declaration under Section 564(b)(1) of the A ct, 21 U.S.C. section 360bbb-3(b)(1), unless the authorization is terminated or revoked sooner.     Influenza A by PCR NEGATIVE NEGATIVE Final   Influenza B by PCR NEGATIVE NEGATIVE Final    Comment: (NOTE) The Xpert Xpress SARS-CoV-2/FLU/RSV plus assay is intended as an  aid in the diagnosis of influenza from Nasopharyngeal swab specimens and should not be used as a sole basis for treatment. Nasal washings and aspirates are unacceptable for Xpert Xpress SARS-CoV-2/FLU/RSV testing.  Fact Sheet for Patients: EntrepreneurPulse.com.au  Fact Sheet for Healthcare Providers: IncredibleEmployment.be  This test is not yet approved or cleared by the [place] FDA and has been authorized for detection and/or diagnosis of SARS-CoV-2 by FDA under an Emergency Use Authorization (EUA). This EUA will remain in effect (meaning this test can be used) for the duration of the COVID-19 declaration under Section 564(b)(1) of the Act, 21 U.S.C. section 360bbb-3(b)(1), unless the authorization is terminated or revoked.     Resp Syncytial Virus by PCR NEGATIVE NEGATIVE Final    Comment: (NOTE) Fact Sheet for Patients: EntrepreneurPulse.com.au  Fact Sheet for Healthcare Providers: IncredibleEmployment.be  This test is not yet approved or cleared by the [place] FDA and has been authorized for detection and/or diagnosis of SARS-CoV-2 by FDA under an Emergency Use Authorization (EUA). This EUA will remain in effect (meaning this test can be used) for the duration of the COVID-19 declaration under Section 564(b)(1) of the Act, 21 U.S.C. section 360bbb-3(b)(1), unless the authorization is terminated or revoked.  Performed at [facility], [address], [place], [place] [address]   Group A Strep by PCR     Status: None   Collection Time: 03/14/22  2:42 AM   Specimen: Anterior Nasal Swab; Sterile Swab  Result Value Ref Range Status   Group A Strep by PCR NOT DETECTED NOT DETECTED Final    Comment: Performed at [facility], [address] [place], [place] [address]     Time coordinating discharge: Over 30 minutes  SIGNED:   Darliss Cheney,  MD  [facility] 03/18/2022, 9:18 AM *Please note that this is a verbal dictation therefore any spelling or grammatical errors are due to the "[place] [employer]" system interpretation. If 7PM-7AM, please contact night-coverage [email or web address]

## 2022-03-18 NOTE — Plan of Care (Signed)
  Problem: Education: Goal: Knowledge of General Education information will improve Description: Including pain rating scale, medication(s)/side effects and non-pharmacologic comfort measures Outcome: Adequate for Discharge   Problem: Health Behavior/Discharge Planning: Goal: Ability to manage health-related needs will improve Outcome: Adequate for Discharge   Problem: Clinical Measurements: Goal: Ability to maintain clinical measurements within normal limits will improve Outcome: Adequate for Discharge Goal: Will remain free from infection Outcome: Adequate for Discharge Goal: Diagnostic test results will improve Outcome: Adequate for Discharge Goal: Respiratory complications will improve Outcome: Adequate for Discharge Goal: Cardiovascular complication will be avoided Outcome: Adequate for Discharge   Problem: Activity: Goal: Risk for activity intolerance will decrease Outcome: Adequate for Discharge   Problem: Nutrition: Goal: Adequate nutrition will be maintained Outcome: Adequate for Discharge   Problem: Coping: Goal: Level of anxiety will decrease Outcome: Adequate for Discharge   Problem: Elimination: Goal: Will not experience complications related to bowel motility Outcome: Adequate for Discharge Goal: Will not experience complications related to urinary retention Outcome: Adequate for Discharge   Problem: Pain Managment: Goal: General experience of comfort will improve Outcome: Adequate for Discharge   Problem: Safety: Goal: Ability to remain free from injury will improve Outcome: Adequate for Discharge   Problem: Skin Integrity: Goal: Risk for impaired skin integrity will decrease Outcome: Adequate for Discharge   Problem: Education: Goal: Ability to describe self-care measures that may prevent or decrease complications (Diabetes Survival Skills Education) will improve Outcome: Adequate for Discharge Goal: Individualized Educational Video(s) Outcome:  Adequate for Discharge   Problem: Coping: Goal: Ability to adjust to condition or change in health will improve Outcome: Adequate for Discharge   Problem: Fluid Volume: Goal: Ability to maintain a balanced intake and output will improve Outcome: Adequate for Discharge   Problem: Health Behavior/Discharge Planning: Goal: Ability to identify and utilize available resources and services will improve Outcome: Adequate for Discharge Goal: Ability to manage health-related needs will improve Outcome: Adequate for Discharge   Problem: Metabolic: Goal: Ability to maintain appropriate glucose levels will improve Outcome: Adequate for Discharge   Problem: Nutritional: Goal: Maintenance of adequate nutrition will improve Outcome: Adequate for Discharge Goal: Progress toward achieving an optimal weight will improve Outcome: Adequate for Discharge   Problem: Skin Integrity: Goal: Risk for impaired skin integrity will decrease Outcome: Adequate for Discharge   Problem: Tissue Perfusion: Goal: Adequacy of tissue perfusion will improve Outcome: Adequate for Discharge   Problem: Education: Goal: Knowledge of risk factors and measures for prevention of condition will improve Outcome: Adequate for Discharge   Problem: Coping: Goal: Psychosocial and spiritual needs will be supported Outcome: Adequate for Discharge   Problem: Respiratory: Goal: Will maintain a patent airway Outcome: Adequate for Discharge Goal: Complications related to the disease process, condition or treatment will be avoided or minimized Outcome: Adequate for Discharge

## 2022-03-20 ENCOUNTER — Emergency Department (HOSPITAL_BASED_OUTPATIENT_CLINIC_OR_DEPARTMENT_OTHER)
Admission: EM | Admit: 2022-03-20 | Discharge: 2022-03-20 | Disposition: A | Discharge status: Home / Self Care | Payer: BC Managed Care – PPO | Attending: Emergency Medicine | Admitting: Emergency Medicine

## 2022-03-20 ENCOUNTER — Other Ambulatory Visit: Payer: Self-pay

## 2022-03-20 ENCOUNTER — Emergency Department (HOSPITAL_BASED_OUTPATIENT_CLINIC_OR_DEPARTMENT_OTHER): Payer: BC Managed Care – PPO | Admitting: Radiology

## 2022-03-20 DIAGNOSIS — R079 Chest pain, unspecified: Secondary | ICD-10-CM | POA: Insufficient documentation

## 2022-03-20 DIAGNOSIS — Z7982 Long term (current) use of aspirin: Secondary | ICD-10-CM | POA: Diagnosis not present

## 2022-03-20 DIAGNOSIS — Z794 Long term (current) use of insulin: Secondary | ICD-10-CM | POA: Diagnosis not present

## 2022-03-20 DIAGNOSIS — E119 Type 2 diabetes mellitus without complications: Secondary | ICD-10-CM | POA: Insufficient documentation

## 2022-03-20 DIAGNOSIS — R0602 Shortness of breath: Secondary | ICD-10-CM | POA: Insufficient documentation

## 2022-03-20 LAB — BASIC METABOLIC PANEL
Anion gap: 11 (ref 5–15)
BUN: 30 mg/dL — ABNORMAL HIGH (ref 6–20)
CO2: 23 mmol/L (ref 22–32)
Calcium: 9.4 mg/dL (ref 8.9–10.3)
Chloride: 99 mmol/L (ref 98–111)
Creatinine, Ser: 1.55 mg/dL — ABNORMAL HIGH (ref 0.44–1.00)
GFR, Estimated: 46 mL/min — ABNORMAL LOW (ref >60)
Glucose, Bld: 303 mg/dL — ABNORMAL HIGH (ref 70–99)
Potassium: 3.8 mmol/L (ref 3.5–5.1)
Sodium: 133 mmol/L — ABNORMAL LOW (ref 135–145)

## 2022-03-20 LAB — CBC
HCT: 32.9 % — ABNORMAL LOW (ref 36.0–46.0)
Hemoglobin: 10.5 g/dL — ABNORMAL LOW (ref 12.0–15.0)
MCH: 27.3 pg (ref 26.0–34.0)
MCHC: 31.9 g/dL (ref 30.0–36.0)
MCV: 85.7 fL (ref 80.0–100.0)
Platelets: 212 10*3/uL (ref 150–400)
RBC: 3.84 MIL/uL — ABNORMAL LOW (ref 3.87–5.11)
RDW: 15.4 % (ref 11.5–15.5)
WBC: 6.8 10*3/uL (ref 4.0–10.5)
nRBC: 0 % (ref 0.0–0.2)

## 2022-03-20 LAB — TROPONIN I (HIGH SENSITIVITY): Troponin I (High Sensitivity): 12 ng/L (ref <18)

## 2022-03-20 LAB — BRAIN NATRIURETIC PEPTIDE: B Natriuretic Peptide: 214.5 pg/mL — ABNORMAL HIGH (ref 0.0–100.0)

## 2022-03-20 LAB — PREGNANCY, URINE: Preg Test, Ur: NEGATIVE

## 2022-03-20 MED ORDER — HYDROMORPHONE HCL 1 MG/ML IJ SOLN
1.0000 mg | Freq: Once | INTRAMUSCULAR | Status: AC
  Administered 2022-03-20: 1 mg via INTRAVENOUS
  Filled 2022-03-20: qty 1

## 2022-03-20 MED ORDER — FUROSEMIDE 10 MG/ML IJ SOLN
80.0000 mg | Freq: Once | INTRAMUSCULAR | Status: AC
  Administered 2022-03-20: 80 mg via INTRAVENOUS
  Filled 2022-03-20: qty 8

## 2022-03-20 MED ORDER — HYDRALAZINE HCL 20 MG/ML IJ SOLN
20.0000 mg | Freq: Once | INTRAMUSCULAR | Status: AC
  Administered 2022-03-20: 20 mg via INTRAVENOUS
  Filled 2022-03-20: qty 1

## 2022-03-20 MED ORDER — FUROSEMIDE 10 MG/ML IJ SOLN
40.0000 mg | Freq: Once | INTRAMUSCULAR | Status: AC
  Administered 2022-03-20: 40 mg via INTRAVENOUS
  Filled 2022-03-20: qty 4

## 2022-03-20 NOTE — Discharge Instructions (Signed)
I would recommend increasing your torsemide dose to 40 mg twice daily (once in the morning and once at night) for the next 3 days.  After that you can continue with your regular 40 mg daily.  Please take all of your other prescribed medications as per normal.  This includes your evening medications tonight.

## 2022-03-20 NOTE — ED Provider Notes (Signed)
[facility] Provider Note   CSN: [id number] Arrival date & time: 03/20/22  1157     History  Chief Complaint  Patient presents with   Shortness of Breath    Jennifer [name] is a 31 y.o. female with history of insulin-dependent diabetes, nonischemic cardiomyopathy, presenting from home with complaint of chest pain, feeling that she is volume overloaded.  She reports she tested positive for COVID 7 days ago.  [name] not certain when she began having actual COVID symptoms.  He says she has had some sharp midsternal chest pain for 2 days which has been constant, different than her typical pain.  She also feels that she is keeping fluid on her lungs and in her legs.  She takes torsemide 40 mg daily.  [name] compliant with all of her medications she reports, including her blood pressure medicines.  [name] due for hydralazine this afternoon.  She says she took her Coreg this morning, and has also been using albuterol inhaler at home, although she denies history of asthma.  Medical records reviewed.  Patient left heart catheterization 2022 which showed a single-vessel 70% occlusion of the RCA, otherwise clean coronary arteries.  Last echocardiogram was in May of this year showing hypokinesis of the inferior wall, EF of 45-50%, mild to moderate mitral regurg, mild atrial regurg.  HPI     Home Medications Prior to Admission medications   Medication Sig Start Date End Date Taking? Authorizing Provider  albuterol (VENTOLIN HFA) 108 (90 Base) MCG/ACT inhaler Inhale 1-2 puffs into the lungs every 6 (six) hours as needed for wheezing or shortness of breath. 12/31/18   [provider]  aspirin EC 81 MG EC tablet Take 1 tablet (81 mg total) by mouth daily. Swallow whole. 12/07/20   Ezekiel Slocumb, DO  carvedilol (COREG) 3.125 MG tablet Take 1 tablet (3.125 mg total) by mouth 2 (two) times daily with a meal. 02/13/22 03/15/22  Donne Hazel, MD  CREON  [id number] units CPEP capsule Take 36,000 Units by mouth 3 (three) times daily.    [provider]  DULoxetine (CYMBALTA) 30 MG capsule Take 30 mg by mouth at bedtime. 01/28/19 01/27/23  [provider]  ergocalciferol (VITAMIN D2) 1.25 MG (50000 UT) capsule Take 50,000 Units by mouth once a week.    [provider]  Evolocumab (REPATHA) 140 MG/ML SOSY Inject 140 mg into the skin See admin instructions. Inject 140 mg subcutaneously every other Sunday evening    [provider]  fenofibrate 160 MG tablet Take 1 tablet (160 mg total) by mouth daily. 12/07/20   Ezekiel Slocumb, DO  HUMULIN R U-500 KWIKPEN 500 UNIT/ML KwikPen Inject 120-300 Units into the skin See admin instructions. 300 units at every meal and 120 units at bedtime 02/22/22   [provider]  insulin aspart (NOVOLOG) 100 UNIT/ML FlexPen Inject 60 Units into the skin 3 (three) times daily with meals. Patient not taking: Reported on 03/15/2022 02/13/22   Donne Hazel, MD  insulin aspart (NOVOLOG) 100 UNIT/ML FlexPen For before meals and at bedtime Sliding scale: CBG 70 - 120: 0 units  CBG 121 - 150: 3 units  CBG 151 - 200: 4 units  CBG 201 - 250: 7 units  CBG 251 - 300: 11 units  CBG 301 - 350: 15 units  CBG 351 - 400: 20 units  CBG > 400: notify your doctor Patient not taking: Reported on 03/15/2022 02/13/22   Marylu Lund  K, MD  insulin glargine (LANTUS) 100 UNIT/ML Solostar Pen Inject 60 Units into the skin 2 (two) times daily. Patient not taking: Reported on 03/15/2022 02/13/22   Donne Hazel, MD  Insulin Pen Needle 32G X 4 MM MISC 1 Device by Does not apply route QID. For use with insulin pens 02/13/22   Donne Hazel, MD  ivabradine (CORLANOR) 7.5 MG TABS tablet Take 1 tablet (7.5 mg total) by mouth 2 (two) times daily with a meal. 08/02/21   [name], Shaune Pascal, MD  levocetirizine (XYZAL) 5 MG tablet Take 5 mg by mouth daily. 03/05/22   [provider]   levothyroxine (SYNTHROID) 125 MCG tablet Take 1 tablet (125 mcg total) by mouth daily at 6 (six) AM. 12/07/20   Ezekiel Slocumb, DO  loratadine (CLARITIN) 10 MG tablet Take 10 mg by mouth daily.    [provider]  ondansetron (ZOFRAN) 4 MG tablet Take 4 mg by mouth every 8 (eight) hours as needed for nausea or vomiting. 12/19/18   [provider]  pantoprazole (PROTONIX) 40 MG tablet Take 40 mg by mouth daily. 12/24/19   [provider]  pregabalin (LYRICA) 150 MG capsule Take 1 capsule (150 mg total) by mouth at bedtime. Patient taking differently: Take 300 mg by mouth at bedtime. 12/12/21   Domenic Polite, MD  prochlorperazine (COMPAZINE) 5 MG tablet Take 5 mg by mouth daily.    [provider]  rosuvastatin (CRESTOR) 40 MG tablet Take 40 mg by mouth daily. 08/09/21   [provider]  sildenafil (REVATIO) 20 MG tablet Take 1 tablet (20 mg total) by mouth 3 (three) times daily. 12/15/21   [name], Shaune Pascal, MD  sodium bicarbonate 650 MG tablet Take 650 mg by mouth 2 (two) times daily.    [provider]  torsemide 40 MG TABS Take 40 mg by mouth daily. 02/14/22 03/16/22  Donne Hazel, MD  tretinoin (RETIN-A) 0.05 % cream Apply 1 application topically at bedtime. 01/17/21   [provider]      Allergies    Ketamine, Erythromycin, Fentanyl, Maitake mushroom [maitake], Penicillin v, Shellfish allergy, Morphine, Penicillins, and Prednisone    Review of Systems   Review of Systems  Physical Exam Updated Vital Signs BP (!) 172/101   Pulse (!) 115   Temp 98.4 F (36.9 C) (Oral)   Resp (!) 23   Ht 5' 2"  (1.575 m)   Wt 65.8 kg   LMP  (LMP Unknown) Comment: no MP in over a year per pt  SpO2 94%   BMI 26.53 kg/m  Physical Exam Constitutional:      General: [name] not in acute distress. HENT:     Head: Normocephalic and atraumatic.  Eyes:     Conjunctiva/sclera: Conjunctivae normal.     Pupils: Pupils are equal, round,  and reactive to light.  Cardiovascular:     Rate and Rhythm: Normal rate and regular rhythm.  Pulmonary:     Effort: Pulmonary effort is normal. No respiratory distress.     Breath sounds: Rhonchi present.  Abdominal:     General: There is no distension.     Tenderness: There is no abdominal tenderness.  Skin:    General: Skin is warm and dry.  Neurological:     General: No focal deficit present.     Mental Status: [name] alert. Mental status is at baseline.  Psychiatric:        Mood and Affect: Mood normal.  Behavior: Behavior normal.     ED Results / Procedures / Treatments   Labs (all labs ordered are listed, but only abnormal results are displayed) Labs Reviewed  BASIC METABOLIC PANEL - Abnormal; Notable for the following components:      Result Value   Sodium 133 (*)    Glucose, Bld 303 (*)    BUN 30 (*)    Creatinine, Ser 1.55 (*)    GFR, Estimated 46 (*)    All other components within normal limits  CBC - Abnormal; Notable for the following components:   RBC 3.84 (*)    Hemoglobin 10.5 (*)    HCT 32.9 (*)    All other components within normal limits  BRAIN NATRIURETIC PEPTIDE - Abnormal; Notable for the following components:   B Natriuretic Peptide 214.5 (*)    All other components within normal limits  PREGNANCY, URINE  TROPONIN I (HIGH SENSITIVITY)    EKG EKG Interpretation  Date/Time:  Sunday March 20 2022 12:27:46 EST Ventricular Rate:  116 PR Interval:  124 QRS Duration: 90 QT Interval:  352 QTC Calculation: 489 R Axis:   5 Text Interpretation: Sinus tachycardia Otherwise normal ECG When compared with ECG of 14-Mar-2022 02:44, No significant change was found Confirmed by Aletta Edouard [phone]) on 03/20/2022 2:32:09 PM  Radiology DG Chest 2 View  Result Date: 03/20/2022 CLINICAL DATA:  Shortness of breath, chest pain EXAM: CHEST - 2 VIEW COMPARISON:  Previous studies including the examination of 03/14/2022 FINDINGS: Cardiac size is in  the upper limits of normal. There are no signs of pulmonary edema or new focal infiltrates. There is no pleural effusion or pneumothorax. There is a monitoring device in the course of left lower lobe pulmonary artery, possibly used to monitor pulmonary arterial pressure. IMPRESSION: No active cardiopulmonary disease. Electronically Signed   By: Elmer Picker M.D.   On: 03/20/2022 14:11    Procedures .Critical Care  Performed by: Wyvonnia Dusky, MD Authorized by: Wyvonnia Dusky, MD   Critical care provider statement:    Critical care time (minutes):  45   Critical care time was exclusive of:  Separately billable procedures and treating other patients   Critical care was necessary to treat or prevent imminent or life-threatening deterioration of the following conditions:  Cardiac failure   Critical care was time spent personally by me on the following activities:  Ordering and performing treatments and interventions, ordering and review of laboratory studies, ordering and review of radiographic studies, pulse oximetry, review of old charts, examination of patient and evaluation of patient's response to treatment Comments:     IV diuresis, repeat dosing and reassessment of volume status for CHF exacerbation     Medications Ordered in ED Medications  furosemide (LASIX) injection 80 mg (80 mg Intravenous Given 03/20/22 1624)  HYDROmorphone (DILAUDID) injection 1 mg (1 mg Intravenous Given 03/20/22 1624)  hydrALAZINE (APRESOLINE) injection 20 mg (20 mg Intravenous Given 03/20/22 1624)  furosemide (LASIX) injection 40 mg (40 mg Intravenous Given 03/20/22 1740)    ED Course/ Medical Decision Making/ A&P Clinical Course as of 03/20/22 2317  Sun Mar 20, 2022  1834 Patient has begun urinating and feels better.  Stable for discharge at this time.  Okay for discharge [MT]    Clinical Course User Index [MT] Arjay Jaskiewicz, Carola Rhine, MD                           Medical Decision  Making Amount and/or Complexity of Data Reviewed Labs: ordered. Radiology: ordered.  Risk Prescription drug management.   This patient presents to the ED with concern for chest pain and shortness of breath. This involves an extensive number of treatment options, and is a complaint that carries with it a high risk of complications and morbidity.  The differential diagnosis includes congestive heart failure exacerbation versus anemia versus pneumonia versus pleural effusion versus other  Clinically I suspect this is likely CHF exacerbation in the setting of recent COVID illness.  Thankfully she is not hypoxic or tachypneic, not requiring BiPAP or supplemental oxygen at this time.  Will try some IV diuresis.  [name] also due for hydralazine which I have ordered, and requested pain medicine for her chest pain which I have ordered.  Co-morbidities that complicate the patient evaluation: History of congestive heart failure at high risk of exacerbation  Additional history obtained from patient's husband at the bedside  External records from outside source obtained and reviewed including recent echocardiogram and left heart catheterization reports as noted above  Review of prior records shows the patient also often has a baseline sinus tachycardia.  I suspect her tachycardia today is simply her baseline rhythm.  I ordered and personally interpreted labs.  The pertinent results include: BNP very mildly elevated.  Troponin unremarkable.  BMP and CBC near baseline levels.  I ordered imaging studies including x-ray of the chest I independently visualized and interpreted imaging which showed no focal infiltrates I agree with the radiologist interpretation  The patient was maintained on a cardiac monitor.  I personally viewed and interpreted the cardiac monitored which showed an underlying rhythm of: Sinus tachycardia  Per my interpretation the patient's ECG shows sinus tachycardia with no acute  ischemic findings  I have reviewed the patients home medicines and have made adjustments as needed.  We have discussed doubling her home torsemide dose from 40 mg once daily to twice daily for the next 3 days to help with her diuresis.  Test Considered: I have a low clinical suspicion for acute pulmonary embolism in the setting.  Do not believe she needs a CT angiogram emergently at this time   After the interventions noted above, I reevaluated the patient and found that they have: improved   After 2 doses of IV diuretics the patient began urinating and larger amounts.  [name] feeling better and okay for discharge.  Dispostion:  After consideration of the diagnostic results and the patients response to treatment, I feel that the patent would benefit from outpatient follow-up         Final Clinical Impression(s) / ED Diagnoses Final diagnoses:  Chest pain, unspecified type  Shortness of breath    Rx / DC Orders ED Discharge Orders     None         Wyvonnia Dusky, MD 03/20/22 2318

## 2022-03-20 NOTE — ED Triage Notes (Signed)
Pt reports SOB and chest pain described as sharp, midline sternal pain for 2 days. Pt h/o CHF and reports swelling in abdomen typical of fluid overload that she's experienced in the past.  Pt tested + for Covid 1 week ago.

## 2022-03-22 ENCOUNTER — Emergency Department (HOSPITAL_BASED_OUTPATIENT_CLINIC_OR_DEPARTMENT_OTHER): Payer: BC Managed Care – PPO | Admitting: Radiology

## 2022-03-22 ENCOUNTER — Encounter (HOSPITAL_BASED_OUTPATIENT_CLINIC_OR_DEPARTMENT_OTHER): Payer: Self-pay

## 2022-03-22 ENCOUNTER — Inpatient Hospital Stay (HOSPITAL_BASED_OUTPATIENT_CLINIC_OR_DEPARTMENT_OTHER)
Admission: EM | Admit: 2022-03-22 | Discharge: 2022-03-25 | DRG: 641 | Disposition: A | Discharge status: Home / Self Care | Payer: BC Managed Care – PPO | Attending: Internal Medicine | Admitting: Internal Medicine

## 2022-03-22 ENCOUNTER — Other Ambulatory Visit: Payer: Self-pay

## 2022-03-22 DIAGNOSIS — Z8616 Personal history of COVID-19: Secondary | ICD-10-CM

## 2022-03-22 DIAGNOSIS — K760 Fatty (change of) liver, not elsewhere classified: Secondary | ICD-10-CM | POA: Diagnosis present

## 2022-03-22 DIAGNOSIS — E162 Hypoglycemia, unspecified: Secondary | ICD-10-CM

## 2022-03-22 DIAGNOSIS — Z6831 Body mass index (BMI) 31.0-31.9, adult: Secondary | ICD-10-CM

## 2022-03-22 DIAGNOSIS — Z91018 Allergy to other foods: Secondary | ICD-10-CM

## 2022-03-22 DIAGNOSIS — E669 Obesity, unspecified: Secondary | ICD-10-CM | POA: Diagnosis present

## 2022-03-22 DIAGNOSIS — I5022 Chronic systolic (congestive) heart failure: Secondary | ICD-10-CM | POA: Diagnosis present

## 2022-03-22 DIAGNOSIS — Z881 Allergy status to other antibiotic agents status: Secondary | ICD-10-CM

## 2022-03-22 DIAGNOSIS — Z7982 Long term (current) use of aspirin: Secondary | ICD-10-CM

## 2022-03-22 DIAGNOSIS — R079 Chest pain, unspecified: Secondary | ICD-10-CM | POA: Diagnosis not present

## 2022-03-22 DIAGNOSIS — Z79890 Hormone replacement therapy (postmenopausal): Secondary | ICD-10-CM

## 2022-03-22 DIAGNOSIS — Z794 Long term (current) use of insulin: Secondary | ICD-10-CM

## 2022-03-22 DIAGNOSIS — K861 Other chronic pancreatitis: Secondary | ICD-10-CM | POA: Diagnosis present

## 2022-03-22 DIAGNOSIS — R000 Tachycardia, unspecified: Secondary | ICD-10-CM | POA: Diagnosis present

## 2022-03-22 DIAGNOSIS — Z85841 Personal history of malignant neoplasm of brain: Secondary | ICD-10-CM

## 2022-03-22 DIAGNOSIS — Z8349 Family history of other endocrine, nutritional and metabolic diseases: Secondary | ICD-10-CM

## 2022-03-22 DIAGNOSIS — Z880 Allergy status to penicillin: Secondary | ICD-10-CM

## 2022-03-22 DIAGNOSIS — E039 Hypothyroidism, unspecified: Secondary | ICD-10-CM | POA: Diagnosis present

## 2022-03-22 DIAGNOSIS — Z833 Family history of diabetes mellitus: Secondary | ICD-10-CM

## 2022-03-22 DIAGNOSIS — E876 Hypokalemia: Secondary | ICD-10-CM | POA: Diagnosis not present

## 2022-03-22 DIAGNOSIS — E11649 Type 2 diabetes mellitus with hypoglycemia without coma: Secondary | ICD-10-CM | POA: Diagnosis present

## 2022-03-22 DIAGNOSIS — N1832 Chronic kidney disease, stage 3b: Secondary | ICD-10-CM | POA: Diagnosis present

## 2022-03-22 DIAGNOSIS — E1122 Type 2 diabetes mellitus with diabetic chronic kidney disease: Secondary | ICD-10-CM | POA: Diagnosis present

## 2022-03-22 DIAGNOSIS — Z91148 Patient's other noncompliance with medication regimen for other reason: Secondary | ICD-10-CM

## 2022-03-22 DIAGNOSIS — I272 Pulmonary hypertension, unspecified: Secondary | ICD-10-CM | POA: Diagnosis present

## 2022-03-22 DIAGNOSIS — Z888 Allergy status to other drugs, medicaments and biological substances status: Secondary | ICD-10-CM

## 2022-03-22 DIAGNOSIS — Z79899 Other long term (current) drug therapy: Secondary | ICD-10-CM

## 2022-03-22 DIAGNOSIS — I2510 Atherosclerotic heart disease of native coronary artery without angina pectoris: Secondary | ICD-10-CM | POA: Diagnosis present

## 2022-03-22 DIAGNOSIS — E781 Pure hyperglyceridemia: Secondary | ICD-10-CM | POA: Diagnosis present

## 2022-03-22 DIAGNOSIS — I130 Hypertensive heart and chronic kidney disease with heart failure and stage 1 through stage 4 chronic kidney disease, or unspecified chronic kidney disease: Secondary | ICD-10-CM | POA: Diagnosis present

## 2022-03-22 DIAGNOSIS — R1900 Intra-abdominal and pelvic swelling, mass and lump, unspecified site: Secondary | ICD-10-CM | POA: Diagnosis present

## 2022-03-22 DIAGNOSIS — Z91013 Allergy to seafood: Secondary | ICD-10-CM

## 2022-03-22 DIAGNOSIS — Z885 Allergy status to narcotic agent status: Secondary | ICD-10-CM

## 2022-03-22 DIAGNOSIS — I428 Other cardiomyopathies: Secondary | ICD-10-CM | POA: Diagnosis present

## 2022-03-22 DIAGNOSIS — K589 Irritable bowel syndrome without diarrhea: Secondary | ICD-10-CM | POA: Diagnosis present

## 2022-03-22 DIAGNOSIS — Z886 Allergy status to analgesic agent status: Secondary | ICD-10-CM

## 2022-03-22 DIAGNOSIS — Z8249 Family history of ischemic heart disease and other diseases of the circulatory system: Secondary | ICD-10-CM

## 2022-03-22 DIAGNOSIS — E282 Polycystic ovarian syndrome: Secondary | ICD-10-CM | POA: Diagnosis present

## 2022-03-22 NOTE — ED Triage Notes (Signed)
Pt c/o dizziness, nausea, low cbg (39) hx of diabetes. States she was able to get it up, but also endorses CP "like fluid overload." Hx chf, compliant w all home meds  Symptom onset this AM

## 2022-03-23 ENCOUNTER — Encounter (HOSPITAL_COMMUNITY): Payer: Self-pay

## 2022-03-23 ENCOUNTER — Observation Stay (HOSPITAL_COMMUNITY): Payer: BC Managed Care – PPO

## 2022-03-23 DIAGNOSIS — K760 Fatty (change of) liver, not elsewhere classified: Secondary | ICD-10-CM | POA: Diagnosis not present

## 2022-03-23 DIAGNOSIS — I2510 Atherosclerotic heart disease of native coronary artery without angina pectoris: Secondary | ICD-10-CM | POA: Diagnosis not present

## 2022-03-23 DIAGNOSIS — Z91148 Patient's other noncompliance with medication regimen for other reason: Secondary | ICD-10-CM | POA: Diagnosis not present

## 2022-03-23 DIAGNOSIS — Z8616 Personal history of COVID-19: Secondary | ICD-10-CM | POA: Diagnosis not present

## 2022-03-23 DIAGNOSIS — I428 Other cardiomyopathies: Secondary | ICD-10-CM | POA: Diagnosis not present

## 2022-03-23 DIAGNOSIS — K861 Other chronic pancreatitis: Secondary | ICD-10-CM | POA: Diagnosis not present

## 2022-03-23 DIAGNOSIS — E781 Pure hyperglyceridemia: Secondary | ICD-10-CM | POA: Diagnosis not present

## 2022-03-23 DIAGNOSIS — Z85841 Personal history of malignant neoplasm of brain: Secondary | ICD-10-CM | POA: Diagnosis not present

## 2022-03-23 DIAGNOSIS — E039 Hypothyroidism, unspecified: Secondary | ICD-10-CM | POA: Diagnosis not present

## 2022-03-23 DIAGNOSIS — Z8249 Family history of ischemic heart disease and other diseases of the circulatory system: Secondary | ICD-10-CM | POA: Diagnosis not present

## 2022-03-23 DIAGNOSIS — E876 Hypokalemia: Secondary | ICD-10-CM | POA: Diagnosis present

## 2022-03-23 DIAGNOSIS — E282 Polycystic ovarian syndrome: Secondary | ICD-10-CM | POA: Diagnosis not present

## 2022-03-23 DIAGNOSIS — E669 Obesity, unspecified: Secondary | ICD-10-CM | POA: Diagnosis not present

## 2022-03-23 DIAGNOSIS — R1900 Intra-abdominal and pelvic swelling, mass and lump, unspecified site: Secondary | ICD-10-CM | POA: Diagnosis not present

## 2022-03-23 DIAGNOSIS — I272 Pulmonary hypertension, unspecified: Secondary | ICD-10-CM | POA: Diagnosis not present

## 2022-03-23 DIAGNOSIS — R079 Chest pain, unspecified: Secondary | ICD-10-CM | POA: Diagnosis present

## 2022-03-23 DIAGNOSIS — E1122 Type 2 diabetes mellitus with diabetic chronic kidney disease: Secondary | ICD-10-CM | POA: Diagnosis not present

## 2022-03-23 DIAGNOSIS — R000 Tachycardia, unspecified: Secondary | ICD-10-CM | POA: Diagnosis not present

## 2022-03-23 DIAGNOSIS — I5022 Chronic systolic (congestive) heart failure: Secondary | ICD-10-CM | POA: Diagnosis not present

## 2022-03-23 DIAGNOSIS — Z794 Long term (current) use of insulin: Secondary | ICD-10-CM | POA: Diagnosis not present

## 2022-03-23 DIAGNOSIS — I130 Hypertensive heart and chronic kidney disease with heart failure and stage 1 through stage 4 chronic kidney disease, or unspecified chronic kidney disease: Secondary | ICD-10-CM | POA: Diagnosis not present

## 2022-03-23 DIAGNOSIS — K589 Irritable bowel syndrome without diarrhea: Secondary | ICD-10-CM | POA: Diagnosis not present

## 2022-03-23 DIAGNOSIS — E11649 Type 2 diabetes mellitus with hypoglycemia without coma: Secondary | ICD-10-CM | POA: Diagnosis not present

## 2022-03-23 DIAGNOSIS — N1832 Chronic kidney disease, stage 3b: Secondary | ICD-10-CM | POA: Diagnosis not present

## 2022-03-23 DIAGNOSIS — Z79899 Other long term (current) drug therapy: Secondary | ICD-10-CM | POA: Diagnosis not present

## 2022-03-23 LAB — BASIC METABOLIC PANEL
Anion gap: 12 (ref 5–15)
Anion gap: 13 (ref 5–15)
BUN: 36 mg/dL — ABNORMAL HIGH (ref 6–20)
BUN: 53 mg/dL — ABNORMAL HIGH (ref 6–20)
CO2: 16 mmol/L — ABNORMAL LOW (ref 22–32)
CO2: 25 mmol/L (ref 22–32)
Calcium: 6.7 mg/dL — ABNORMAL LOW (ref 8.9–10.3)
Calcium: 9.3 mg/dL (ref 8.9–10.3)
Chloride: 114 mmol/L — ABNORMAL HIGH (ref 98–111)
Chloride: 99 mmol/L (ref 98–111)
Creatinine, Ser: 1.26 mg/dL — ABNORMAL HIGH (ref 0.44–1.00)
Creatinine, Ser: 1.78 mg/dL — ABNORMAL HIGH (ref 0.44–1.00)
GFR, Estimated: 59 mL/min — ABNORMAL LOW (ref >60)
GFR, Estimated: 39 mL/min — ABNORMAL LOW (ref >60)
Glucose, Bld: 46 mg/dL — ABNORMAL LOW (ref 70–99)
Glucose, Bld: 182 mg/dL — ABNORMAL HIGH (ref 70–99)
Potassium: 2.1 mmol/L — CL (ref 3.5–5.1)
Potassium: 4 mmol/L (ref 3.5–5.1)
Sodium: 142 mmol/L (ref 135–145)
Sodium: 137 mmol/L (ref 135–145)

## 2022-03-23 LAB — TROPONIN I (HIGH SENSITIVITY)
Troponin I (High Sensitivity): 14 ng/L (ref <18)
Troponin I (High Sensitivity): 11 ng/L (ref <18)

## 2022-03-23 LAB — CBC
HCT: 36.9 % (ref 36.0–46.0)
Hemoglobin: 12.8 g/dL (ref 12.0–15.0)
MCH: 29 pg (ref 26.0–34.0)
MCHC: 34.7 g/dL (ref 30.0–36.0)
MCV: 83.5 fL (ref 80.0–100.0)
Platelets: 248 10*3/uL (ref 150–400)
RBC: 4.42 MIL/uL (ref 3.87–5.11)
RDW: 15.2 % (ref 11.5–15.5)
WBC: 10.5 10*3/uL (ref 4.0–10.5)
nRBC: 0 % (ref 0.0–0.2)

## 2022-03-23 LAB — RESP PANEL BY RT-PCR (RSV, FLU A&B, COVID)  RVPGX2
Influenza A by PCR: NEGATIVE
Influenza B by PCR: NEGATIVE
Resp Syncytial Virus by PCR: NEGATIVE
SARS Coronavirus 2 by RT PCR: POSITIVE — AB

## 2022-03-23 LAB — MAGNESIUM: Magnesium: 1.4 mg/dL — ABNORMAL LOW (ref 1.7–2.4)

## 2022-03-23 LAB — CBG MONITORING, ED
Glucose-Capillary: 165 mg/dL — ABNORMAL HIGH (ref 70–99)
Glucose-Capillary: 48 mg/dL — ABNORMAL LOW (ref 70–99)
Glucose-Capillary: 62 mg/dL — ABNORMAL LOW (ref 70–99)
Glucose-Capillary: 64 mg/dL — ABNORMAL LOW (ref 70–99)
Glucose-Capillary: 72 mg/dL (ref 70–99)
Glucose-Capillary: 90 mg/dL (ref 70–99)
Glucose-Capillary: 91 mg/dL (ref 70–99)

## 2022-03-23 LAB — GLUCOSE, CAPILLARY
Glucose-Capillary: 325 mg/dL — ABNORMAL HIGH (ref 70–99)
Glucose-Capillary: 339 mg/dL — ABNORMAL HIGH (ref 70–99)

## 2022-03-23 MED ORDER — ACETAMINOPHEN 325 MG PO TABS: 650.0000 mg | ORAL_TABLET | Freq: Four times a day (QID) | ORAL | Status: DC | PRN

## 2022-03-23 MED ORDER — ALBUTEROL SULFATE HFA 108 (90 BASE) MCG/ACT IN AERS: 1.0000 | INHALATION_SPRAY | Freq: Four times a day (QID) | RESPIRATORY_TRACT | Status: DC | PRN

## 2022-03-23 MED ORDER — HYDROMORPHONE HCL 1 MG/ML IJ SOLN
1.0000 mg | Freq: Once | INTRAMUSCULAR | Status: AC
  Administered 2022-03-23: 1 mg via INTRAVENOUS
  Filled 2022-03-23: qty 1

## 2022-03-23 MED ORDER — FUROSEMIDE 10 MG/ML IJ SOLN
40.0000 mg | Freq: Two times a day (BID) | INTRAMUSCULAR | Status: DC
  Administered 2022-03-23 – 2022-03-25 (×4): 40 mg via INTRAVENOUS
  Filled 2022-03-23 (×4): qty 4

## 2022-03-23 MED ORDER — ASPIRIN 81 MG PO TBEC
81.0000 mg | DELAYED_RELEASE_TABLET | Freq: Every day | ORAL | Status: DC
  Administered 2022-03-24 – 2022-03-25 (×2): 81 mg via ORAL
  Filled 2022-03-23 (×2): qty 1

## 2022-03-23 MED ORDER — PREGABALIN 75 MG PO CAPS
150.0000 mg | ORAL_CAPSULE | Freq: Once | ORAL | Status: AC
  Administered 2022-03-23: 150 mg via ORAL
  Filled 2022-03-23: qty 2

## 2022-03-23 MED ORDER — IVABRADINE HCL 7.5 MG PO TABS
7.5000 mg | ORAL_TABLET | Freq: Two times a day (BID) | ORAL | Status: DC
  Administered 2022-03-23 – 2022-03-25 (×4): 7.5 mg via ORAL
  Filled 2022-03-23 (×5): qty 1

## 2022-03-23 MED ORDER — PANTOPRAZOLE SODIUM 40 MG PO TBEC
40.0000 mg | DELAYED_RELEASE_TABLET | Freq: Every day | ORAL | Status: DC
  Administered 2022-03-24 – 2022-03-25 (×2): 40 mg via ORAL
  Filled 2022-03-23 (×2): qty 1

## 2022-03-23 MED ORDER — SODIUM CHLORIDE 0.9% FLUSH: 3.0000 mL | INTRAVENOUS | Status: DC | PRN

## 2022-03-23 MED ORDER — ACETAMINOPHEN 650 MG RE SUPP: 650.0000 mg | Freq: Four times a day (QID) | RECTAL | Status: DC | PRN

## 2022-03-23 MED ORDER — SILDENAFIL CITRATE 20 MG PO TABS
20.0000 mg | ORAL_TABLET | Freq: Three times a day (TID) | ORAL | Status: DC
  Administered 2022-03-23 – 2022-03-25 (×5): 20 mg via ORAL
  Filled 2022-03-23 (×8): qty 1

## 2022-03-23 MED ORDER — DULOXETINE HCL 30 MG PO CPEP
30.0000 mg | ORAL_CAPSULE | Freq: Every day | ORAL | Status: DC
  Administered 2022-03-23 – 2022-03-24 (×2): 30 mg via ORAL
  Filled 2022-03-23 (×2): qty 1

## 2022-03-23 MED ORDER — POTASSIUM CHLORIDE CRYS ER 20 MEQ PO TBCR
40.0000 meq | EXTENDED_RELEASE_TABLET | Freq: Once | ORAL | Status: AC
  Administered 2022-03-23: 40 meq via ORAL
  Filled 2022-03-23: qty 2

## 2022-03-23 MED ORDER — SODIUM CHLORIDE 0.9% FLUSH
3.0000 mL | Freq: Two times a day (BID) | INTRAVENOUS | Status: DC
  Administered 2022-03-23 – 2022-03-25 (×4): 3 mL via INTRAVENOUS

## 2022-03-23 MED ORDER — INSULIN ASPART 100 UNIT/ML IJ SOLN
4.0000 [IU] | Freq: Once | INTRAMUSCULAR | Status: AC
  Administered 2022-03-23: 4 [IU] via SUBCUTANEOUS

## 2022-03-23 MED ORDER — CARVEDILOL 3.125 MG PO TABS
3.1250 mg | ORAL_TABLET | Freq: Two times a day (BID) | ORAL | Status: DC
  Administered 2022-03-23 – 2022-03-25 (×4): 3.125 mg via ORAL
  Filled 2022-03-23 (×4): qty 1

## 2022-03-23 MED ORDER — POTASSIUM CHLORIDE CRYS ER 20 MEQ PO TBCR
20.0000 meq | EXTENDED_RELEASE_TABLET | Freq: Once | ORAL | Status: AC
  Administered 2022-03-23: 20 meq via ORAL
  Filled 2022-03-23: qty 1

## 2022-03-23 MED ORDER — HYDROCODONE-ACETAMINOPHEN 5-325 MG PO TABS
2.0000 | ORAL_TABLET | Freq: Once | ORAL | Status: AC
  Administered 2022-03-23: 2 via ORAL
  Filled 2022-03-23: qty 2

## 2022-03-23 MED ORDER — ROSUVASTATIN CALCIUM 20 MG PO TABS
40.0000 mg | ORAL_TABLET | Freq: Every day | ORAL | Status: DC
  Administered 2022-03-24 – 2022-03-25 (×2): 40 mg via ORAL
  Filled 2022-03-23 (×2): qty 2

## 2022-03-23 MED ORDER — PANCRELIPASE (LIP-PROT-AMYL) 36000-114000 UNITS PO CPEP
36000.0000 [IU] | ORAL_CAPSULE | Freq: Three times a day (TID) | ORAL | Status: DC
  Administered 2022-03-23 – 2022-03-25 (×6): 36000 [IU] via ORAL
  Filled 2022-03-23 (×6): qty 1

## 2022-03-23 MED ORDER — FENOFIBRATE 160 MG PO TABS
160.0000 mg | ORAL_TABLET | Freq: Every day | ORAL | Status: DC
  Administered 2022-03-24 – 2022-03-25 (×2): 160 mg via ORAL
  Filled 2022-03-23 (×2): qty 1

## 2022-03-23 MED ORDER — HYDROCODONE-ACETAMINOPHEN 5-325 MG PO TABS
1.0000 | ORAL_TABLET | ORAL | Status: DC | PRN
  Administered 2022-03-25 (×2): 2 via ORAL
  Filled 2022-03-23 (×4): qty 2

## 2022-03-23 MED ORDER — POTASSIUM CHLORIDE 10 MEQ/100ML IV SOLN
10.0000 meq | Freq: Once | INTRAVENOUS | Status: AC
  Administered 2022-03-23: 10 meq via INTRAVENOUS
  Filled 2022-03-23: qty 100

## 2022-03-23 MED ORDER — GUAIFENESIN-DM 100-10 MG/5ML PO SYRP
10.0000 mL | ORAL_SOLUTION | Freq: Four times a day (QID) | ORAL | Status: DC | PRN
  Administered 2022-03-23 – 2022-03-24 (×2): 10 mL via ORAL
  Filled 2022-03-23 (×2): qty 10

## 2022-03-23 MED ORDER — INSULIN ASPART 100 UNIT/ML IJ SOLN
0.0000 [IU] | Freq: Three times a day (TID) | INTRAMUSCULAR | Status: DC
  Administered 2022-03-23: 4 [IU] via SUBCUTANEOUS

## 2022-03-23 MED ORDER — SODIUM CHLORIDE 0.9 % IV SOLN: 250.0000 mL | INTRAVENOUS | Status: DC | PRN

## 2022-03-23 MED ORDER — FUROSEMIDE 10 MG/ML IJ SOLN
40.0000 mg | Freq: Once | INTRAMUSCULAR | Status: AC
  Administered 2022-03-23: 40 mg via INTRAVENOUS
  Filled 2022-03-23: qty 4

## 2022-03-23 MED ORDER — ENOXAPARIN SODIUM 80 MG/0.8ML IJ SOSY
1.0000 mg/kg | PREFILLED_SYRINGE | Freq: Two times a day (BID) | INTRAMUSCULAR | Status: DC
  Administered 2022-03-23 – 2022-03-24 (×2): 65 mg via SUBCUTANEOUS
  Filled 2022-03-23 (×2): qty 0.8

## 2022-03-23 MED ORDER — MAGNESIUM SULFATE 2 GM/50ML IV SOLN
2.0000 g | Freq: Once | INTRAVENOUS | Status: AC
  Administered 2022-03-23: 2 g via INTRAVENOUS
  Filled 2022-03-23: qty 50

## 2022-03-23 MED ORDER — HYDROMORPHONE HCL 1 MG/ML IJ SOLN
0.5000 mg | INTRAMUSCULAR | Status: DC | PRN
  Administered 2022-03-23 – 2022-03-25 (×7): 0.5 mg via INTRAVENOUS
  Filled 2022-03-23 (×7): qty 0.5

## 2022-03-23 MED ORDER — LEVOTHYROXINE SODIUM 25 MCG PO TABS
125.0000 ug | ORAL_TABLET | Freq: Every day | ORAL | Status: DC
  Administered 2022-03-24 – 2022-03-25 (×2): 125 ug via ORAL
  Filled 2022-03-23 (×2): qty 1

## 2022-03-23 NOTE — ED Notes (Signed)
Pregnancy was neg when here 2 days ago

## 2022-03-23 NOTE — ED Notes (Signed)
CBG 62. Pt brought apple juice.

## 2022-03-23 NOTE — ED Notes (Signed)
Blood glucose 64. Mac 'n cheese and diet cola given

## 2022-03-23 NOTE — ED Notes (Signed)
CBG 90. Pt eating french fries & chicken nuggets

## 2022-03-23 NOTE — ED Provider Notes (Signed)
[facility] EMERGENCY DEPT Provider Note   CSN: [id number] Arrival date & time: 03/22/22  2317     History  Chief Complaint  Patient presents with   Chest Pain   Hypoglycemia    Jennifer [name] is a 31 y.o. female.  The history is provided by the patient and the spouse.  Chest Pain Associated symptoms: nausea and vomiting   Associated symptoms: no fever   Hypoglycemia Associated symptoms: vomiting    Patient with extensive history including astrocytoma, nonischemic or myopathy, diabetes, hypertension presents with multiple concerns.  Patient reports that she has been having increasing chest pain and short of breath as if she feels like she is "filling up with fluid " No lower extremity edema, but feels that it is in her chest and her abdomen She also reports that she noted that her glucose dropped and she had an episode of nausea and vomiting.  She reports since her recent admission she has not been using her Dexcom.  She does not have an insulin pump. She reports recent cough.  No fevers.    Past Medical History:  Diagnosis Date   Brain tumor [facility]) 03/29/1995   astrocytoma   CHF (congestive heart failure) (HCC)    Cholesterosis    DM (diabetes mellitus) ([place]) 10/10/2018   Fatty liver    HTN (hypertension) 10/10/2018   Hypothyroidism 10/10/2018   Lipoprotein deficiency    Lung disease    longevity long term   Pancreatitis    Polycystic ovary syndrome     Home Medications Prior to Admission medications   Medication Sig Start Date End Date Taking? Authorizing Provider  albuterol (VENTOLIN HFA) 108 (90 Base) MCG/ACT inhaler Inhale 1-2 puffs into the lungs every 6 (six) hours as needed for wheezing or shortness of breath. 12/31/18   [provider]  aspirin EC 81 MG EC tablet Take 1 tablet (81 mg total) by mouth daily. Swallow whole. 12/07/20   Ezekiel Slocumb, DO  carvedilol (COREG) 3.125 MG tablet Take 1 tablet (3.125 mg total) by mouth 2  (two) times daily with a meal. 02/13/22 03/15/22  Donne Hazel, MD  CREON [phone] units CPEP capsule Take 36,000 Units by mouth 3 (three) times daily.    [provider]  DULoxetine (CYMBALTA) 30 MG capsule Take 30 mg by mouth at bedtime. 01/28/19 01/27/23  [provider]  ergocalciferol (VITAMIN D2) 1.25 MG (50000 UT) capsule Take 50,000 Units by mouth once a week.    [provider]  Evolocumab (REPATHA) 140 MG/ML SOSY Inject 140 mg into the skin See admin instructions. Inject 140 mg subcutaneously every other Sunday evening    [provider]  fenofibrate 160 MG tablet Take 1 tablet (160 mg total) by mouth daily. 12/07/20   Ezekiel Slocumb, DO  HUMULIN R U-500 KWIKPEN 500 UNIT/ML KwikPen Inject 120-300 Units into the skin See admin instructions. 300 units at every meal and 120 units at bedtime 02/22/22   [provider]  insulin aspart (NOVOLOG) 100 UNIT/ML FlexPen Inject 60 Units into the skin 3 (three) times daily with meals. Patient not taking: Reported on 03/15/2022 02/13/22   Donne Hazel, MD  insulin aspart (NOVOLOG) 100 UNIT/ML FlexPen For before meals and at bedtime Sliding scale: CBG 70 - 120: 0 units  CBG 121 - 150: 3 units  CBG 151 - 200: 4 units  CBG 201 - 250: 7 units  CBG 251 - 300: 11 units  CBG 301 - 350:  15 units  CBG 351 - 400: 20 units  CBG > 400: notify your doctor Patient not taking: Reported on 03/15/2022 02/13/22   Donne Hazel, MD  insulin glargine (LANTUS) 100 UNIT/ML Solostar Pen Inject 60 Units into the skin 2 (two) times daily. Patient not taking: Reported on 03/15/2022 02/13/22   Donne Hazel, MD  Insulin Pen Needle 32G X 4 MM MISC 1 Device by Does not apply route QID. For use with insulin pens 02/13/22   Donne Hazel, MD  ivabradine (CORLANOR) 7.5 MG TABS tablet Take 1 tablet (7.5 mg total) by mouth 2 (two) times daily with a meal. 08/02/21   [name], Shaune Pascal, MD  levocetirizine (XYZAL) 5 MG  tablet Take 5 mg by mouth daily. 03/05/22   [provider]  levothyroxine (SYNTHROID) 125 MCG tablet Take 1 tablet (125 mcg total) by mouth daily at 6 (six) AM. 12/07/20   Ezekiel Slocumb, DO  loratadine (CLARITIN) 10 MG tablet Take 10 mg by mouth daily.    [provider]  ondansetron (ZOFRAN) 4 MG tablet Take 4 mg by mouth every 8 (eight) hours as needed for nausea or vomiting. 12/19/18   [provider]  pantoprazole (PROTONIX) 40 MG tablet Take 40 mg by mouth daily. 12/24/19   [provider]  pregabalin (LYRICA) 150 MG capsule Take 1 capsule (150 mg total) by mouth at bedtime. Patient taking differently: Take 300 mg by mouth at bedtime. 12/12/21   Domenic Polite, MD  prochlorperazine (COMPAZINE) 5 MG tablet Take 5 mg by mouth daily.    [provider]  rosuvastatin (CRESTOR) 40 MG tablet Take 40 mg by mouth daily. 08/09/21   [provider]  sildenafil (REVATIO) 20 MG tablet Take 1 tablet (20 mg total) by mouth 3 (three) times daily. 12/15/21   [name], Shaune Pascal, MD  sodium bicarbonate 650 MG tablet Take 650 mg by mouth 2 (two) times daily.    [provider]  torsemide 40 MG TABS Take 40 mg by mouth daily. 02/14/22 03/16/22  Donne Hazel, MD  tretinoin (RETIN-A) 0.05 % cream Apply 1 application topically at bedtime. 01/17/21   [provider]      Allergies    Ketamine, Erythromycin, Fentanyl, Maitake mushroom [maitake], Penicillin v, Shellfish allergy, Morphine, Penicillins, and Prednisone    Review of Systems   Review of Systems  Constitutional:  Negative for fever.  Cardiovascular:  Positive for chest pain.  Gastrointestinal:  Positive for nausea and vomiting.    Physical Exam Updated Vital Signs BP (!) 148/91   Pulse (!) 110   Temp 97.6 F (36.4 C)   Resp (!) 25   LMP  (LMP Unknown) Comment: no MP in over a year per pt  SpO2 95%  Physical Exam CONSTITUTIONAL: Chronically ill-appearing, no acute  distress HEAD: Normocephalic/atraumatic EYES: Chronic ptosis on the right ENMT: Mucous membranes moist NECK: supple no meningeal signs CV: S1/S2 noted, no murmurs/rubs/gallops noted LUNGS: Wheezes noted in the bases, no acute distress ABDOMEN: soft, protuberant but nontender NEURO: Pt is awake/alert/appropriate EXTREMITIES: pulses normal/equal, full ROM, no lower extremity edema SKIN: warm, color normal PSYCH: no abnormalities of mood noted, alert and oriented to situation  ED Results / Procedures / Treatments   Labs (all labs ordered are listed, but only abnormal results are displayed) Labs Reviewed  BASIC METABOLIC PANEL - Abnormal; Notable for the following components:      Result Value   Potassium 2.1 (*)  Chloride 114 (*)    CO2 16 (*)    Glucose, Bld 46 (*)    BUN 36 (*)    Creatinine, Ser 1.26 (*)    Calcium 6.7 (*)    GFR, Estimated 59 (*)    All other components within normal limits  CBG MONITORING, ED - Abnormal; Notable for the following components:   Glucose-Capillary 64 (*)    All other components within normal limits  CBC  MAGNESIUM  BASIC METABOLIC PANEL  CBG MONITORING, ED  CBG MONITORING, ED  CBG MONITORING, ED  CBG MONITORING, ED  CBG MONITORING, ED  TROPONIN I (HIGH SENSITIVITY)  TROPONIN I (HIGH SENSITIVITY)    EKG ED ECG REPORT   Date: 03/22/2022 2333  Rate: 120  Rhythm: sinus tachycardia  QRS Axis: normal  Intervals: normal  ST/T Wave abnormalities:  LVH noted  Conduction Disutrbances:none  Narrative Interpretation:   Old EKG Reviewed: unchanged  I have personally reviewed the EKG tracing and agree with the computerized printout as noted.   Radiology DG Chest 2 View  Result Date: 03/23/2022 CLINICAL DATA:  Chest pain EXAM: CHEST - 2 VIEW COMPARISON:  03/20/2022 FINDINGS: Lungs are clear save for a pulmonary arterial pressure monitoring unit within the left lower lobe. No pneumothorax or pleural effusion. Cardiac size within normal  limits. Pulmonary vascularity is normal. No acute bone abnormality. IMPRESSION: 1. No active cardiopulmonary disease. Electronically Signed   By: Fidela Salisbury M.D.   On: 03/23/2022 00:00    Procedures .Critical Care  Performed by: Ripley Fraise, MD Authorized by: Ripley Fraise, MD   Critical care provider statement:    Critical care time (minutes):  45   Critical care start time:  03/23/2022 5:15 AM   Critical care end time:  03/23/2022 6:00 AM   Critical care time was exclusive of:  Separately billable procedures and treating other patients   Critical care was necessary to treat or prevent imminent or life-threatening deterioration of the following conditions:  Metabolic crisis, endocrine crisis and cardiac failure   Critical care was time spent personally by me on the following activities:  Examination of patient, development of treatment plan with patient or surrogate, pulse oximetry, re-evaluation of patient's condition, ordering and performing treatments and interventions, ordering and review of laboratory studies and ordering and review of radiographic studies   I assumed direction of critical care for this patient from another provider in my specialty: no     Care discussed with: admitting provider       Medications Ordered in ED Medications  potassium chloride 10 mEq in 100 mL IVPB ( Intravenous Rate/Dose Change 03/23/22 0623)  HYDROcodone-acetaminophen (NORCO/VICODIN) 5-325 MG per tablet 2 tablet (2 tablets Oral Given 03/23/22 0229)  furosemide (LASIX) injection 40 mg (40 mg Intravenous Given 03/23/22 0402)  potassium chloride SA (KLOR-CON M) CR tablet 40 mEq (40 mEq Oral Given 03/23/22 0555)    ED Course/ Medical Decision Making/ A&P Clinical Course as of 03/23/22 2263  Wed Mar 23, 2022  0224 Patient with multiple ER visits presents with multiple complaints.  She reports an episode of hypoglycemia that triggered nausea vomiting and now having chest pain.  Glucose is  now improving.  Initial troponin is negative.  She reports feeling volume overloaded but no signs of this on x-ray.  Labs are pending at this time. [DW]  [phone] Patient had a mild drop in glucose.  [name] in no distress.  [name] eating.  [name] requesting Lasix, reports  she missed a dose [DW]  0607 Patient with significant hypokalemia.  It took several hours for labs around, and they called over to confirm the significant abnormalities.  This may be due to her underlying use of diuretics. [DW]  0607 Patient will need to be admitted due to multiple metabolic abnormalities.  [name] otherwise resting comfortably [DW]  [phone] Discussed with Dr. Josephine Cables for admission.  She has been given 1 dose of oral potassium as well as 1 dose of IV potassium.  Will recheck be met, will check magnesium.  Will also need to continue to watch her glucose.  [name] otherwise in no acute distress [DW]    Clinical Course User Index [DW] Ripley Fraise, MD                           Medical Decision Making Amount and/or Complexity of Data Reviewed Labs: ordered. Radiology: ordered.  Risk Prescription drug management. Decision regarding hospitalization.   This patient presents to the ED for concern of chest pain, this involves an extensive number of treatment options, and is a complaint that carries with it a high risk of complications and morbidity.  The differential diagnosis includes but is not limited to acute coronary syndrome, aortic dissection, pulmonary embolism, pericarditis, pneumothorax, pneumonia, myocarditis, pleurisy, esophageal rupture    Comorbidities that complicate the patient evaluation: Patient's presentation is complicated by their history of nonischemic cardiomyopathy, diabetes  Social Determinants of Health: Patient's  multiple ER visits   increases the complexity of managing their presentation  Additional history obtained: Additional history obtained from spouse Records reviewed previous  admission documents  Lab Tests: I Ordered, and personally interpreted labs.  The pertinent results include: Hypokalemia, hypoglycemia, depressed bicarb without anion gap  Imaging Studies ordered: I ordered imaging studies including X-ray chest   I independently visualized and interpreted imaging which showed no acute findings I agree with the radiologist interpretation  Cardiac Monitoring: The patient was maintained on a cardiac monitor.  I personally viewed and interpreted the cardiac monitor which showed an underlying rhythm of:  sinus tachycardia  Medicines ordered and prescription drug management: I ordered medication including Vicodin for pain Reevaluation of the patient after these medicines showed that the patient    improved  Critical Interventions:   IV potassium  Consultations Obtained: I requested consultation with the admitting physician Dr. Josephine Cables , and discussed  findings as well as pertinent plan - they recommend: Admit  Reevaluation: After the interventions noted above, I reevaluated the patient and found that they have :stayed the same  Complexity of problems addressed: Patient's presentation is most consistent with  acute presentation with potential threat to life or bodily function  Disposition: After consideration of the diagnostic results and the patient's response to treatment,  I feel that the patent would benefit from admission   .    Patient initially did presented for chest pain and concern for CHF.  She requested a dose of Lasix as she felt that she was in CHF.  Patient did have hypoglycemia, reports not having her Dexcom recently.  Last dose of insulin was around 6 PM on December 26. Patient found to have multiple metabolic abnormalities that would be need to be corrected in hospital.        Final Clinical Impression(s) / ED Diagnoses Final diagnoses:  Hypokalemia  Hypoglycemia    Rx / DC Orders ED Discharge Orders     None  Ripley Fraise, MD 03/23/22 [phone]

## 2022-03-23 NOTE — Progress Notes (Signed)
Plan of Care Note for accepted transfer   Patient: Jennifer [name] MRN: [id number]   DOA: 03/22/2022  Facility requesting transfer: [facility] Requesting Provider: Ripley Fraise, MD Reason for transfer: Hypokalemia, hypoglycemia Facility course: [place] of care: The patient is accepted for admission to Telemetry unit, at [facility]..   Patient with past medical history of astrocytoma, nonischemic cardiomyopathy, hypothyroidism, hypertension, diabetes presents to [facility] emergency department due to increased chest pain and shortness of breath, chest pain was described as sensation of " feeling up with fluid" in the chest and abdomen.  Patient also complained of blood glucose dropping as well as an episode of nausea and vomiting. She was recently admitted from 12/18 to 12/22 due to acute hypoxic respiratory failure secondary to COVID-19 pneumonia. She was started on remdesivir, dexamethasone as well as bronchodilators and incentive spirometry. She was weaned down to room air.  Patient states that she has not used Dexcom since being discharged from the hospital.  She does not have an insulin pump In the emergency department, she was tachycardic, intermittently tachypneic, BP was 140/91 on arrival, O2 sat was 95% on room air.  Workup in the ED, troponin, chloride 148, bicarb 16, glucose 46, BUN/creatinine 36/1.26, magnesium 1.4.  X 2 was negative. Chest x-ray showed no active cardiopulmonary disease Patient was treated with IV Lasix 40 mg x 1, Norco was given, potassium was replenished, magnesium was replenished. [facility] was consulted for transfer of patient to [facility] for further evaluation and management. The ED physician at [facility] will continue to manage patient's condition until patient's arrived to [facility], when [facility] team will take over the care of the patient.  Author: Bernadette Hoit, DO 03/23/2022  Check [email or web address] for on-call coverage.  Nursing  staff, Please call [place] number on Amion as soon as patient's arrival, so appropriate admitting provider can evaluate the pt.

## 2022-03-23 NOTE — Inpatient Diabetes Management (Signed)
Inpatient Diabetes Program Recommendations  AACE/ADA: New Consensus Statement on Inpatient Glycemic Control (2015)  Target Ranges:  Prepandial:   less than 140 mg/dL      Peak postprandial:   less than 180 mg/dL (1-2 hours)      Critically ill patients:  140 - 180 mg/dL   Lab Results  Component Value Date   GLUCAP 48 (L) 03/23/2022   HGBA1C 8.5 (H) 02/13/2022    Review of Glycemic Control  Diabetes history: 3C (per [facility]) Outpatient Diabetes medications: U-500 300 units TID with meals and 110 units QHS Current orders for Inpatient glycemic control: None  Continues with hypoglycemia this am. To transfer to [facility]. Last insulin dose 12/26 at 1800  Inpatient Diabetes Program Recommendations:    CBGs Q1H at present.  When no hypoglycemia and pt is eating, consider:  Semglee 30 units BID Novolog 0-15 units TID with meals and 0-5 HS Novolog 10 units TID with meals if eating > 50%  Will need to titrate insulin doses daily.  Check status of Dexcom during hospitalization, as pt reports not having it.  Follow closely.  Thank you. Lorenda Peck, RD, LDN, [place] Inpatient Diabetes Coordinator [phone]

## 2022-03-23 NOTE — Progress Notes (Signed)
ANTICOAGULATION CONSULT NOTE - Initial Consult  Pharmacy Consult for enoxaparin Indication: pulmonary embolus  Allergies  Allergen Reactions   Ketamine Other (See Comments)    In vegetative state for 15 minutes per pt   Erythromycin     unk   Fentanyl Nausea And Vomiting   Maitake Mushroom [Maitake] Itching    Itchy throat   Penicillin V Itching    Gi upset   Shellfish Allergy Other (See Comments)    Pt has never had shellfish but tested positive on allergy test. Pt states contrast in CT is okay   Morphine Itching and Rash   Penicillins Rash    Has patient had a PCN reaction causing immediate rash, facial/tongue/throat swelling, SOB or lightheadedness with hypotension: Y Has patient had a PCN reaction causing severe rash involving mucus membranes or skin necrosis: Y Has patient had a PCN reaction that required hospitalization: N Has patient had a PCN reaction occurring within the last 10 years: Y If all of the above answers are "NO", then may proceed with Cephalosporin use.    Prednisone Rash    Patient Measurements:   Heparin Dosing Weight: 63.6 kg   Vital Signs: Temp: 97.8 F (36.6 C) (12/27 1520) Temp Source: Oral (12/27 1520) BP: 141/84 (12/27 1520) Pulse Rate: 105 (12/27 1520)  Labs: Recent Labs    03/22/22 2331 03/23/22 0110 03/23/22 0150 03/23/22 1306  HGB 12.8  --   --   --   HCT 36.9  --   --   --   PLT 248  --   --   --   CREATININE  --   --  1.26* 1.78*  TROPONINIHS 14 11  --   --     Estimated Creatinine Clearance: 41.1 mL/min (A) (by C-G formula based on SCr of 1.78 mg/dL (H)).   Medical History: Past Medical History:  Diagnosis Date   Brain tumor ([place]) 03/29/1995   astrocytoma   CHF (congestive heart failure) (HCC)    Cholesterosis    DM (diabetes mellitus) ([place]) 10/10/2018   Fatty liver    HTN (hypertension) 10/10/2018   Hypothyroidism 10/10/2018   Lipoprotein deficiency    Lung disease    longevity long term   Pancreatitis     Polycystic ovary syndrome     Medications:  Scheduled:   aspirin EC  81 mg Oral Daily   carvedilol  3.125 mg Oral BID WC   DULoxetine  30 mg Oral QHS   fenofibrate  160 mg Oral Daily   furosemide  40 mg Intravenous Q12H   insulin aspart  0-6 Units Subcutaneous TID WC   ivabradine  7.5 mg Oral BID WC   [START ON 03/24/2022] levothyroxine  125 mcg Oral Q0600   lipase/protease/amylase  36,000 Units Oral TID   pantoprazole  40 mg Oral Daily   rosuvastatin  40 mg Oral Daily   sildenafil  20 mg Oral TID   sodium chloride flush  3 mL Intravenous Q12H    Assessment: 30 yof presenting with CP and hypoglycemia - no AC PTA.   Concern for possible PE - plan for VQ scan. Has had recent COVID and now reporting pleuritic CP. Scr 1.78 (CrCl 41 mL/min). CBC stable last night - Hgb 12.8, plt 248.  Goal of Therapy:  Anti-Xa level 0.6-1 units/ml 4hrs after LMWH dose given Monitor platelets by anticoagulation protocol: Yes   Plan:  Enoxaparin 65 mg (1 mg/kg) every 12 hours Monitor renal fx, weight, s/sx of bleeding,  and levels as appropriate  Antonietta Jewel, PharmD, [place] Clinical Pharmacist  Phone: [phone] 03/23/2022 4:26 PM  Please check AMION for all [place] phone numbers After 10:00 PM, call [place] [phone]

## 2022-03-23 NOTE — H&P (Addendum)
History and Physical    Patient: Jennifer [name] DOB: 04/21/1990 DOA: 03/22/2022 DOS: the patient was seen and examined on 03/23/2022 PCP: Sue Lush, PA-C  Patient coming from: Home  Chief Complaint:  Chief Complaint  Patient presents with   Chest Pain   Hypoglycemia   HPI: Jennifer [name] is a 31 y.o. 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  She had a recent diagnosis of COVID 31, and recently discharged from the hospital 12/22.   She presented with low blood sugar and chest pain.  Chest pain in the middle of the chest, dull in quality associated with deep breath, she also reported wheezing.  She saw that she was volume overload. He continued to cough.  Evaluation after operation she was found to have potassium of 2.1, CO2 16, glucose 46, calcium 6.7, magnesium 1.4  Admitted for further evaluation.  She received IV Lasix at [facility].  Potassium has improved to 4.0.  CBG is slowly increasing.  Chest x-ray no active cardiopulmonary diseases      Review of Systems: As mentioned in the history of present illness. All other systems reviewed and are negative. Past Medical History:  Diagnosis Date   Brain tumor ([place]) 03/29/1995   astrocytoma   CHF (congestive heart failure) (HCC)    Cholesterosis    DM (diabetes mellitus) ([place]) 10/10/2018   Fatty liver    HTN (hypertension) 10/10/2018   Hypothyroidism 10/10/2018   Lipoprotein deficiency    Lung disease    longevity long term   Pancreatitis    Polycystic ovary syndrome    Past Surgical History:  Procedure Laterality Date   ABDOMINAL SURGERY     pt states during miscarriage got her intestine   BRAIN SURGERY     EYE MUSCLE SURGERY Right 03/28/2014    PRESSURE SENSOR/CARDIOMEMS N/A 12/06/2021   Procedure: PRESSURE SENSOR/CARDIOMEMS;  Surgeon: [name], MD;  Location: [facility];  Service: Cardiovascular;  Laterality: N/A;   RIGHT HEART CATH N/A 08/05/2021   Procedure: RIGHT HEART CATH;  Surgeon: [name], MD;  Location: [facility];  Service: Cardiovascular;  Laterality: N/A;   RIGHT HEART CATH N/A 12/06/2021   Procedure: RIGHT HEART CATH;  Surgeon: [name], MD;  Location: [facility];  Service: Cardiovascular;  Laterality: N/A;   RIGHT/LEFT HEART CATH AND CORONARY ANGIOGRAPHY N/A 12/03/2020   Procedure: RIGHT/LEFT HEART CATH AND CORONARY ANGIOGRAPHY;  Surgeon: [name], MD;  Location: [facility];  Service: Cardiovascular;  Laterality: N/A;   VENTRICULOSTOMY  03/28/1997   Social History:  reports that she has never smoked. She has never used smokeless tobacco. She reports that she does not drink alcohol and does not use drugs.  Allergies  Allergen Reactions   Ketamine Other (See Comments)    In vegetative state for 15 minutes per pt   Erythromycin     unk   Fentanyl Nausea And Vomiting   Maitake Mushroom [[place]] Itching    Itchy throat   Penicillin V Itching    Gi upset   Shellfish Allergy Other (See Comments)    Pt has never had shellfish but tested positive on allergy test. Pt states contrast in CT is okay   Morphine Itching and Rash   Penicillins Rash  Has patient had a PCN reaction causing immediate rash, facial/tongue/throat swelling, SOB or lightheadedness with hypotension: Y Has patient had a PCN reaction causing severe rash involving mucus membranes or skin necrosis: Y Has patient had a PCN reaction that required hospitalization: N Has patient had a PCN reaction occurring within the last 10 years: Y If all of the above answers are "NO", then may proceed with Cephalosporin use.    Prednisone Rash    Family History  Problem Relation Age of Onset   Diabetes  Mother    Hypertension Mother    Hyperlipidemia Mother    Thyroid disease Mother    Hypertension Father    Diabetes Father    Pancreatic cancer Paternal Aunt    Pancreatic cancer Paternal Uncle    Colon cancer Neg Hx    Esophageal cancer Neg Hx    Stomach cancer Neg Hx     Prior to Admission medications   Medication Sig Start Date End Date Taking? Authorizing Provider  albuterol (VENTOLIN HFA) 108 (90 Base) MCG/ACT inhaler Inhale 1-2 puffs into the lungs every 6 (six) hours as needed for wheezing or shortness of breath. 12/31/18   [provider]  aspirin EC 81 MG EC tablet Take 1 tablet (81 mg total) by mouth daily. Swallow whole. 12/07/20   Ezekiel Slocumb, DO  carvedilol (COREG) 3.125 MG tablet Take 1 tablet (3.125 mg total) by mouth 2 (two) times daily with a meal. 02/13/22 03/15/22  Donne Hazel, MD  CREON [phone] units CPEP capsule Take 36,000 Units by mouth 3 (three) times daily.    [provider]  DULoxetine (CYMBALTA) 30 MG capsule Take 30 mg by mouth at bedtime. 01/28/19 01/27/23  [provider]  ergocalciferol (VITAMIN D2) 1.25 MG (50000 UT) capsule Take 50,000 Units by mouth once a week.    [provider]  Evolocumab (REPATHA) 140 MG/ML SOSY Inject 140 mg into the skin See admin instructions. Inject 140 mg subcutaneously every other Sunday evening    [provider]  fenofibrate 160 MG tablet Take 1 tablet (160 mg total) by mouth daily. 12/07/20   Ezekiel Slocumb, DO  HUMULIN R U-500 KWIKPEN 500 UNIT/ML KwikPen Inject 120-300 Units into the skin See admin instructions. 300 units at every meal and 120 units at bedtime 02/22/22   [provider]  insulin aspart (NOVOLOG) 100 UNIT/ML FlexPen Inject 60 Units into the skin 3 (three) times daily with meals. Patient not taking: Reported on 03/15/2022 02/13/22   Donne Hazel, MD  insulin aspart (NOVOLOG) 100 UNIT/ML FlexPen For before meals and at bedtime Sliding  scale: CBG 70 - 120: 0 units  CBG 121 - 150: 3 units  CBG 151 - 200: 4 units  CBG 201 - 250: 7 units  CBG 251 - 300: 11 units  CBG 301 - 350: 15 units  CBG 351 - 400: 20 units  CBG > 400: notify your doctor Patient not taking: Reported on 03/15/2022 02/13/22   Donne Hazel, MD  insulin glargine (LANTUS) 100 UNIT/ML Solostar Pen Inject 60 Units into the skin 2 (two) times daily. Patient not taking: Reported on 03/15/2022 02/13/22   Donne Hazel, MD  Insulin Pen Needle 32G X 4 MM MISC 1 Device by Does not apply route QID. For use with insulin pens 02/13/22   Donne Hazel, MD  ivabradine (CORLANOR) 7.5 MG TABS tablet Take 1 tablet (7.5 mg total) by mouth 2 (two) times daily with a  meal. 08/02/21   [name], Shaune Pascal, MD  levocetirizine (XYZAL) 5 MG tablet Take 5 mg by mouth daily. 03/05/22   [provider]  levothyroxine (SYNTHROID) 125 MCG tablet Take 1 tablet (125 mcg total) by mouth daily at 6 (six) AM. 12/07/20   Ezekiel Slocumb, DO  loratadine (CLARITIN) 10 MG tablet Take 10 mg by mouth daily.    [provider]  ondansetron (ZOFRAN) 4 MG tablet Take 4 mg by mouth every 8 (eight) hours as needed for nausea or vomiting. 12/19/18   [provider]  pantoprazole (PROTONIX) 40 MG tablet Take 40 mg by mouth daily. 12/24/19   [provider]  pregabalin (LYRICA) 150 MG capsule Take 1 capsule (150 mg total) by mouth at bedtime. Patient taking differently: Take 300 mg by mouth at bedtime. 12/12/21   Domenic Polite, MD  prochlorperazine (COMPAZINE) 5 MG tablet Take 5 mg by mouth daily.    [provider]  rosuvastatin (CRESTOR) 40 MG tablet Take 40 mg by mouth daily. 08/09/21   [provider]  sildenafil (REVATIO) 20 MG tablet Take 1 tablet (20 mg total) by mouth 3 (three) times daily. 12/15/21   [name], Shaune Pascal, MD  sodium bicarbonate 650 MG tablet Take 650 mg by mouth 2 (two) times daily.    [provider]  torsemide 40 MG  TABS Take 40 mg by mouth daily. 02/14/22 03/16/22  Donne Hazel, MD  tretinoin (RETIN-A) 0.05 % cream Apply 1 application topically at bedtime. 01/17/21   [provider]    Physical Exam: Vitals:   03/23/22 0600 03/23/22 0746 03/23/22 1319 03/23/22 1520  BP: (!) 151/94 (!) 154/85 137/85 (!) 141/84  Pulse: (!) 103 (!) 110 (!) 104 (!) 105  Resp: (!) 21 (!) 27 17 16   Temp:  98.3 F (36.8 C) 98 F (36.7 C) 97.8 F (36.6 C)  TempSrc:   Oral Oral  SpO2: 96% 96% 93% 94%   General; NAD, peri orbital edema.  CVS; S 1, S 2 RRR Lungs; BL crackles.  Abdomen; obese, distended, NT Extremity , trace edema Neuro; alert, conversant, follows command.   Data Reviewed:  There are no new results to review at this time.  Assessment and Plan: 1-Diabetes type 3 c, Hypoglycemia.  Continue to hold U 500.  CBG improved. Cbg on admission 45.  SSI. Sensitive  2-Chest pain;  Recent covid, pleuritic chest pain. Will cover with Lovenox until VQ scan results.  Check V-Q scan.  Troponin negative.   EKG: Sinus Tachycardia.   3-Non Ischemic cardiomyopathy; recover EF; concern for acute HF exacerbation.  Continue with ivabradine and sildenafil.  Start IV lasix.  Monitor out put.  Has Cardiomems, Follow with Dr Haroldine Laws  Elevation BNP, BL crackles. Abdominal distension  Troponin negative.   CKD stage stage IV. Cr baeline 2.2 Monitor on lasix.   Hypothyroidism; Continue with synthroid.  Abdominal distension; check KUB,  Recent Covid. Diagnosis 12/18. Required oxygen and steroids, and remdesivir.  Still having cough. Keep on precaution.  COVID-19 Labs  No results for input(s): "DDIMER", "FERRITIN", "LDH", "CRP" in the last 72 hours.  Lab Results  Component Value Date   [place] (A) 03/14/2022   [place] NEGATIVE 02/06/2022   [place] NEGATIVE 05/13/2021   [place] NEGATIVE 03/29/2021     Advance Care Planning:   Code Status: Prior Full code, discussed with  patient.   Consults: None  Family Communication: Care discussed with patient.   Severity of Illness: The  appropriate patient status for this patient is OBSERVATION. Observation status is judged to be reasonable and necessary in order to provide the required intensity of service to ensure the patient's safety. The patient's presenting symptoms, physical exam findings, and initial radiographic and laboratory data in the context of their medical condition is felt to place them at decreased risk for further clinical deterioration. Furthermore, it is anticipated that the patient will be medically stable for discharge from the hospital within 2 midnights of admission.   Author: Elmarie Shiley, MD 03/23/2022 3:39 PM  For on call review [email or web address].

## 2022-03-23 NOTE — Plan of Care (Signed)
  Problem: Education: Goal: Knowledge of risk factors and measures for prevention of condition will improve Outcome: Progressing   Problem: Education: Goal: Knowledge of General Education information will improve Description: Including pain rating scale, medication(s)/side effects and non-pharmacologic comfort measures Outcome: Progressing

## 2022-03-23 NOTE — ED Notes (Signed)
Pt blood sugar was 62. Pt offered po snacks and drink. Will recheck blood sugar once patient has eaten.

## 2022-03-23 NOTE — ED Notes (Addendum)
Attempted to call report to nurse Larene Beach on 6N. Also, via chat. Nurse read chat. No questions at this time.

## 2022-03-24 ENCOUNTER — Inpatient Hospital Stay (HOSPITAL_COMMUNITY): Payer: BC Managed Care – PPO

## 2022-03-24 DIAGNOSIS — E669 Obesity, unspecified: Secondary | ICD-10-CM | POA: Diagnosis present

## 2022-03-24 DIAGNOSIS — R1900 Intra-abdominal and pelvic swelling, mass and lump, unspecified site: Secondary | ICD-10-CM | POA: Diagnosis present

## 2022-03-24 DIAGNOSIS — N1832 Chronic kidney disease, stage 3b: Secondary | ICD-10-CM | POA: Diagnosis present

## 2022-03-24 DIAGNOSIS — R000 Tachycardia, unspecified: Secondary | ICD-10-CM | POA: Diagnosis present

## 2022-03-24 DIAGNOSIS — I272 Pulmonary hypertension, unspecified: Secondary | ICD-10-CM | POA: Diagnosis present

## 2022-03-24 DIAGNOSIS — K589 Irritable bowel syndrome without diarrhea: Secondary | ICD-10-CM | POA: Diagnosis present

## 2022-03-24 DIAGNOSIS — I5022 Chronic systolic (congestive) heart failure: Secondary | ICD-10-CM | POA: Diagnosis present

## 2022-03-24 DIAGNOSIS — R079 Chest pain, unspecified: Secondary | ICD-10-CM

## 2022-03-24 DIAGNOSIS — E282 Polycystic ovarian syndrome: Secondary | ICD-10-CM | POA: Diagnosis present

## 2022-03-24 DIAGNOSIS — I428 Other cardiomyopathies: Secondary | ICD-10-CM | POA: Diagnosis not present

## 2022-03-24 DIAGNOSIS — U071 COVID-19: Secondary | ICD-10-CM

## 2022-03-24 DIAGNOSIS — Z85841 Personal history of malignant neoplasm of brain: Secondary | ICD-10-CM | POA: Diagnosis not present

## 2022-03-24 DIAGNOSIS — I2510 Atherosclerotic heart disease of native coronary artery without angina pectoris: Secondary | ICD-10-CM | POA: Diagnosis present

## 2022-03-24 DIAGNOSIS — Z79899 Other long term (current) drug therapy: Secondary | ICD-10-CM | POA: Diagnosis not present

## 2022-03-24 DIAGNOSIS — K760 Fatty (change of) liver, not elsewhere classified: Secondary | ICD-10-CM | POA: Diagnosis present

## 2022-03-24 DIAGNOSIS — Z794 Long term (current) use of insulin: Secondary | ICD-10-CM | POA: Diagnosis not present

## 2022-03-24 DIAGNOSIS — E11649 Type 2 diabetes mellitus with hypoglycemia without coma: Secondary | ICD-10-CM | POA: Diagnosis present

## 2022-03-24 DIAGNOSIS — K861 Other chronic pancreatitis: Secondary | ICD-10-CM | POA: Diagnosis present

## 2022-03-24 DIAGNOSIS — Z91148 Patient's other noncompliance with medication regimen for other reason: Secondary | ICD-10-CM | POA: Diagnosis not present

## 2022-03-24 DIAGNOSIS — E039 Hypothyroidism, unspecified: Secondary | ICD-10-CM | POA: Diagnosis present

## 2022-03-24 DIAGNOSIS — E1122 Type 2 diabetes mellitus with diabetic chronic kidney disease: Secondary | ICD-10-CM | POA: Diagnosis present

## 2022-03-24 DIAGNOSIS — Z8616 Personal history of COVID-19: Secondary | ICD-10-CM | POA: Diagnosis not present

## 2022-03-24 DIAGNOSIS — Z8249 Family history of ischemic heart disease and other diseases of the circulatory system: Secondary | ICD-10-CM | POA: Diagnosis not present

## 2022-03-24 DIAGNOSIS — E781 Pure hyperglyceridemia: Secondary | ICD-10-CM | POA: Diagnosis present

## 2022-03-24 DIAGNOSIS — E876 Hypokalemia: Secondary | ICD-10-CM | POA: Diagnosis not present

## 2022-03-24 DIAGNOSIS — I130 Hypertensive heart and chronic kidney disease with heart failure and stage 1 through stage 4 chronic kidney disease, or unspecified chronic kidney disease: Secondary | ICD-10-CM | POA: Diagnosis present

## 2022-03-24 LAB — BASIC METABOLIC PANEL
Anion gap: 11 (ref 5–15)
BUN: 51 mg/dL — ABNORMAL HIGH (ref 6–20)
CO2: 18 mmol/L — ABNORMAL LOW (ref 22–32)
Calcium: 8.5 mg/dL — ABNORMAL LOW (ref 8.9–10.3)
Chloride: 101 mmol/L (ref 98–111)
Creatinine, Ser: 1.83 mg/dL — ABNORMAL HIGH (ref 0.44–1.00)
GFR, Estimated: 38 mL/min — ABNORMAL LOW (ref >60)
Glucose, Bld: 353 mg/dL — ABNORMAL HIGH (ref 70–99)
Potassium: 4.7 mmol/L (ref 3.5–5.1)
Sodium: 130 mmol/L — ABNORMAL LOW (ref 135–145)

## 2022-03-24 LAB — MAGNESIUM: Magnesium: 2.3 mg/dL (ref 1.7–2.4)

## 2022-03-24 LAB — GLUCOSE, CAPILLARY
Glucose-Capillary: 392 mg/dL — ABNORMAL HIGH (ref 70–99)
Glucose-Capillary: 420 mg/dL — ABNORMAL HIGH (ref 70–99)
Glucose-Capillary: 219 mg/dL — ABNORMAL HIGH (ref 70–99)
Glucose-Capillary: 170 mg/dL — ABNORMAL HIGH (ref 70–99)

## 2022-03-24 LAB — CBC
HCT: 31.3 % — ABNORMAL LOW (ref 36.0–46.0)
Hemoglobin: 10.4 g/dL — ABNORMAL LOW (ref 12.0–15.0)
MCH: 28.9 pg (ref 26.0–34.0)
MCHC: 33.2 g/dL (ref 30.0–36.0)
MCV: 86.9 fL (ref 80.0–100.0)
Platelets: 163 10*3/uL (ref 150–400)
RBC: 3.6 MIL/uL — ABNORMAL LOW (ref 3.87–5.11)
RDW: 15.6 % — ABNORMAL HIGH (ref 11.5–15.5)
WBC: 10 10*3/uL (ref 4.0–10.5)
nRBC: 0 % (ref 0.0–0.2)

## 2022-03-24 LAB — GLUCOSE, RANDOM: Glucose, Bld: 360 mg/dL — ABNORMAL HIGH (ref 70–99)

## 2022-03-24 MED ORDER — INSULIN GLARGINE-YFGN 100 UNIT/ML ~~LOC~~ SOLN
30.0000 [IU] | Freq: Two times a day (BID) | SUBCUTANEOUS | Status: DC
  Administered 2022-03-24: 30 [IU] via SUBCUTANEOUS
  Filled 2022-03-24 (×2): qty 0.3

## 2022-03-24 MED ORDER — SODIUM BICARBONATE 650 MG PO TABS
650.0000 mg | ORAL_TABLET | Freq: Two times a day (BID) | ORAL | Status: DC
  Administered 2022-03-24 – 2022-03-25 (×3): 650 mg via ORAL
  Filled 2022-03-24 (×3): qty 1

## 2022-03-24 MED ORDER — INSULIN ASPART 100 UNIT/ML IJ SOLN
20.0000 [IU] | Freq: Three times a day (TID) | INTRAMUSCULAR | Status: DC
  Administered 2022-03-24 – 2022-03-25 (×3): 20 [IU] via SUBCUTANEOUS

## 2022-03-24 MED ORDER — TECHNETIUM TO 99M ALBUMIN AGGREGATED
3.8000 | Freq: Once | INTRAVENOUS | Status: AC | PRN
  Administered 2022-03-24: 3.8 via INTRAVENOUS

## 2022-03-24 MED ORDER — INSULIN ASPART 100 UNIT/ML IJ SOLN
0.0000 [IU] | Freq: Three times a day (TID) | INTRAMUSCULAR | Status: DC
  Administered 2022-03-24: 5 [IU] via SUBCUTANEOUS
  Administered 2022-03-24 (×2): 15 [IU] via SUBCUTANEOUS
  Administered 2022-03-25: 8 [IU] via SUBCUTANEOUS
  Administered 2022-03-25: 11 [IU] via SUBCUTANEOUS

## 2022-03-24 MED ORDER — INSULIN GLARGINE-YFGN 100 UNIT/ML ~~LOC~~ SOLN
10.0000 [IU] | Freq: Once | SUBCUTANEOUS | Status: AC
  Administered 2022-03-24: 10 [IU] via SUBCUTANEOUS
  Filled 2022-03-24: qty 0.1

## 2022-03-24 MED ORDER — PREGABALIN 75 MG PO CAPS
150.0000 mg | ORAL_CAPSULE | Freq: Once | ORAL | Status: AC
  Administered 2022-03-25: 150 mg via ORAL
  Filled 2022-03-24: qty 2

## 2022-03-24 MED ORDER — GUAIFENESIN ER 600 MG PO TB12
600.0000 mg | ORAL_TABLET | Freq: Two times a day (BID) | ORAL | Status: DC
  Administered 2022-03-24 – 2022-03-25 (×3): 600 mg via ORAL
  Filled 2022-03-24 (×3): qty 1

## 2022-03-24 MED ORDER — INSULIN ASPART 100 UNIT/ML IJ SOLN: 0.0000 [IU] | Freq: Every day | INTRAMUSCULAR | Status: DC

## 2022-03-24 MED ORDER — DIPHENHYDRAMINE HCL 25 MG PO CAPS
25.0000 mg | ORAL_CAPSULE | Freq: Once | ORAL | Status: AC
  Administered 2022-03-24: 25 mg via ORAL
  Filled 2022-03-24: qty 1

## 2022-03-24 MED ORDER — INSULIN ASPART 100 UNIT/ML IJ SOLN
10.0000 [IU] | Freq: Three times a day (TID) | INTRAMUSCULAR | Status: DC
  Administered 2022-03-24: 10 [IU] via SUBCUTANEOUS

## 2022-03-24 MED ORDER — INSULIN GLARGINE-YFGN 100 UNIT/ML ~~LOC~~ SOLN
40.0000 [IU] | Freq: Two times a day (BID) | SUBCUTANEOUS | Status: DC
  Administered 2022-03-24 – 2022-03-25 (×2): 40 [IU] via SUBCUTANEOUS
  Filled 2022-03-24 (×3): qty 0.4

## 2022-03-24 NOTE — Plan of Care (Signed)
  Problem: Education: Goal: Knowledge of risk factors and measures for prevention of condition will improve Outcome: Progressing   Problem: Coping: Goal: Psychosocial and spiritual needs will be supported Outcome: Progressing   Problem: Education: Goal: Knowledge of General Education information will improve Description: Including pain rating scale, medication(s)/side effects and non-pharmacologic comfort measures Outcome: Progressing   Problem: Activity: Goal: Risk for activity intolerance will decrease Outcome: Progressing   Problem: Coping: Goal: Level of anxiety will decrease Outcome: Progressing   Problem: Pain Managment: Goal: General experience of comfort will improve Outcome: Progressing   Problem: Safety: Goal: Ability to remain free from injury will improve Outcome: Progressing   Problem: Skin Integrity: Goal: Risk for impaired skin integrity will decrease Outcome: Progressing   Problem: Coping: Goal: Ability to adjust to condition or change in health will improve Outcome: Progressing

## 2022-03-24 NOTE — Progress Notes (Signed)
PROGRESS NOTE    Jennifer [name]  FBP:[id number] DOB: 1990-12-22 DOA: 03/22/2022 PCP: Sue Lush, PA-C   Brief Narrative:  Jennifer [name] is a 31 y.o. female with medical history significant of astrocytoma at age 13 with residual motor deficit and secondary diabetes mellitus, hypothyroidism, nonischemic cardiomyopathy with recovered ejection fraction, CKD stage IIIb who presents to the ED complaining of upper glycemia and chest pain.  She had a recent diagnosis of COVID 34, and recently discharged from the hospital 12/22.     She presented with low blood sugar and chest pain.  Chest pain in the middle of the chest, dull in quality associated with deep breath, she also reported wheezing.  She saw that she was volume overload. He continued to cough.   Evaluation after operation she was found to have potassium of 2.1, CO2 16, glucose 46, calcium 6.7, magnesium 1.4   Admitted for further evaluation.  She received IV Lasix at [facility].  Potassium has improved to 4.0.  CBG increasing.  Chest x-ray no active cardiopulmonary diseases     Assessment & Plan:   Principal Problem:   Hypokalemia Active Problems:   Chest pain  1-Diabetes type 3 c, Hypoglycemia.  Continue to hold U 500.  CBG improved. Cbg on admission 45.  SSI. CBG increasing 300. Plan to start semglee 30 units BID, 10 units meal coverage, SSI.   2-Chest pain;  Recent covid, pleuritic chest pain. Will cover with Lovenox until VQ scan results.   V-Q scan. pending Troponin negative.   EKG: Sinus Tachycardia.  Suspect chest pain related to cough, but await VQ scan results.   3-Non Ischemic cardiomyopathy; recover EF; Acute HF exacerbation.  Continue with ivabradine and sildenafil.  Continue with IV lasix.  Monitor out put.  Has Cardiomems, Follow with Dr Haroldine Laws  Elevation BNP, BL crackles. Abdominal distension  Troponin negative.  Strict I and O.  HF team consulted.   CKD stage stage IV. Cr baeline  2.2 Monitor on lasix.    Hypokalemia; replaced.  Hypomagnesemia; replaced. Await labs.  Hypothyroidism; Continue with synthroid.  Hepatomegalia; needs weight loss.  Abdominal distension; KUB, Mild gaseous distension stomach.  Recent Covid. Diagnosis 12/18. Required oxygen and steroids, and remdesivir.  Still having cough. Keep on precaution.  COVID-19 Labs       Estimated body mass index is 31.44 kg/m as calculated from the following:   Height as of this encounter: 4' 9"  (1.448 m).   Weight as of this encounter: 65.9 kg.   DVT prophylaxis: Lovenox Code Status: Full code Family Communication: Care discussed with patient.  Disposition Plan:  Status is: Observation The patient remains OBS appropriate and will d/c before 2 midnights.    Consultants:  HF team  Procedures:  VQ scan   Antimicrobials:    Subjective: She report improvement of dyspnea and abdominal distension.  Still report cough and pleuritic chest pain.   Objective: Vitals:   03/23/22 1520 03/23/22 1800 03/23/22 1951 03/24/22 0622  BP: (!) 141/84  115/71 123/61  Pulse: (!) 105  88 84  Resp: 16  17 17   Temp: 97.8 F (36.6 C)  97.6 F (36.4 C) 98 F (36.7 C)  TempSrc: Oral  Oral Oral  SpO2: 94%  95% 97%  Weight:  65.9 kg    Height:  4' 9"  (1.448 m)     No intake or output data in the 24 hours ending 03/24/22 0705 Filed Weights   03/23/22 1800  Weight: 65.9 kg  Examination:  General exam: Appears calm and comfortable  Respiratory system: Crackles bases.  Cardiovascular system: S1 & S2 heard, RRR. No JVD, murmurs, rubs, gallops or clicks. No pedal edema. Gastrointestinal system: Abdomen is nondistended, soft and nontender. No organomegaly or masses felt. Normal bowel sounds heard. Central nervous system: Alert and oriented.  Extremities: Symmetric 5 x 5 power.    Data Reviewed: I have personally reviewed following labs and imaging studies  CBC: Recent Labs  Lab 03/20/22 1258  03/22/22 2331 03/24/22 0046  WBC 6.8 10.5 10.0  HGB 10.5* 12.8 10.4*  HCT 32.9* 36.9 31.3*  MCV 85.7 83.5 86.9  PLT 212 248 297   Basic Metabolic Panel: Recent Labs  Lab 03/18/22 0318 03/20/22 1258 03/23/22 0150 03/23/22 0605 03/23/22 1306 03/24/22 0046  NA 137 133* 142  --  137 130*  K 4.2 3.8 2.1*  --  4.0 4.7  CL 103 99 114*  --  99 101  CO2 21* 23 16*  --  25 18*  GLUCOSE 294* 303* 46*  --  182* 353*  BUN 52* 30* 36*  --  53* 51*  CREATININE 2.29* 1.55* 1.26*  --  1.78* 1.83*  CALCIUM 9.0 9.4 6.7*  --  9.3 8.5*  MG  --   --   --  1.4*  --   --    GFR: Estimated Creatinine Clearance: 35.1 mL/min (A) (by C-G formula based on SCr of 1.83 mg/dL (H)). Liver Function Tests: Recent Labs  Lab 03/18/22 0318  AST 44*  ALT 37  ALKPHOS 82  BILITOT 0.3  PROT 6.7  ALBUMIN 3.4*   No results for input(s): "LIPASE", "AMYLASE" in the last 168 hours. No results for input(s): "AMMONIA" in the last 168 hours. Coagulation Profile: No results for input(s): "INR", "PROTIME" in the last 168 hours. Cardiac Enzymes: No results for input(s): "CKTOTAL", "CKMB", "CKMBINDEX", "TROPONINI" in the last 168 hours. BNP (last 3 results) No results for input(s): "PROBNP" in the last 8760 hours. HbA1C: No results for input(s): "HGBA1C" in the last 72 hours. CBG: Recent Labs  Lab 03/23/22 0950 03/23/22 1100 03/23/22 1304 03/23/22 1900 03/23/22 1950  GLUCAP 48* 90 165* 325* 339*   Lipid Profile: No results for input(s): "CHOL", "HDL", "LDLCALC", "TRIG", "CHOLHDL", "LDLDIRECT" in the last 72 hours. Thyroid Function Tests: No results for input(s): "TSH", "T4TOTAL", "FREET4", "T3FREE", "THYROIDAB" in the last 72 hours. Anemia Panel: No results for input(s): "VITAMINB12", "FOLATE", "FERRITIN", "TIBC", "IRON", "RETICCTPCT" in the last 72 hours. Sepsis Labs: No results for input(s): "PROCALCITON", "LATICACIDVEN" in the last 168 hours.  Recent Results (from the past 240 hour(s))  Resp  panel by RT-PCR (RSV, Flu A&B, Covid) Anterior Nasal Swab     Status: Abnormal   Collection Time: 03/23/22  4:37 PM   Specimen: Anterior Nasal Swab  Result Value Ref Range Status   SARS Coronavirus 2 by RT PCR POSITIVE (A) NEGATIVE Final    Comment: (NOTE) SARS-CoV-2 target nucleic acids are DETECTED.  The SARS-CoV-2 RNA is generally detectable in upper respiratory specimens during the acute phase of infection. Positive results are indicative of the presence of the identified virus, but do not rule out bacterial infection or co-infection with other pathogens not detected by the test. Clinical correlation with patient history and other diagnostic information is necessary to determine patient infection status. The expected result is Negative.  Fact Sheet for Patients: EntrepreneurPulse.com.au  Fact Sheet for Healthcare Providers: IncredibleEmployment.be  This test is not yet approved or cleared  by the [place] and  has been authorized for detection and/or diagnosis of SARS-CoV-2 by FDA under an Emergency Use Authorization (EUA).  This EUA will remain in effect (meaning this test can be used) for the duration of  the COVID-19 declaration under Section 564(b)(1) of the A ct, 21 U.S.C. section 360bbb-3(b)(1), unless the authorization is terminated or revoked sooner.     Influenza A by PCR NEGATIVE NEGATIVE Final   Influenza B by PCR NEGATIVE NEGATIVE Final    Comment: (NOTE) The Xpert Xpress SARS-CoV-2/FLU/RSV plus assay is intended as an aid in the diagnosis of influenza from Nasopharyngeal swab specimens and should not be used as a sole basis for treatment. Nasal washings and aspirates are unacceptable for Xpert Xpress SARS-CoV-2/FLU/RSV testing.  Fact Sheet for Patients: EntrepreneurPulse.com.au  Fact Sheet for Healthcare Providers: IncredibleEmployment.be  This test is not yet approved or  cleared by the [place] FDA and has been authorized for detection and/or diagnosis of SARS-CoV-2 by FDA under an Emergency Use Authorization (EUA). This EUA will remain in effect (meaning this test can be used) for the duration of the COVID-19 declaration under Section 564(b)(1) of the Act, 21 U.S.C. section 360bbb-3(b)(1), unless the authorization is terminated or revoked.     Resp Syncytial Virus by PCR NEGATIVE NEGATIVE Final    Comment: (NOTE) Fact Sheet for Patients: EntrepreneurPulse.com.au  Fact Sheet for Healthcare Providers: IncredibleEmployment.be  This test is not yet approved or cleared by the [place] FDA and has been authorized for detection and/or diagnosis of SARS-CoV-2 by FDA under an Emergency Use Authorization (EUA). This EUA will remain in effect (meaning this test can be used) for the duration of the COVID-19 declaration under Section 564(b)(1) of the Act, 21 U.S.C. section 360bbb-3(b)(1), unless the authorization is terminated or revoked.  Performed at [facility], [place] [address] [place], [place] [address]          Radiology Studies: DG Abd 1 View  Result Date: 03/23/2022 CLINICAL DATA:  Abdominal distension EXAM: ABDOMEN - 1 VIEW COMPARISON:  None Available. FINDINGS: Mild gaseous distention of the stomach. Gas within nondistended large and small bowel. Liver appears prominent. No free air or suspicious calcification. Visualized lung bases clear. IMPRESSION: Mild gaseous distention of the stomach. Prominent liver suspicious for hepatomegaly. Recommend clinical correlation. Electronically Signed   By: Rolm Baptise M.D.   On: 03/23/2022 18:16   DG Chest 2 View  Result Date: 03/23/2022 CLINICAL DATA:  Chest pain EXAM: CHEST - 2 VIEW COMPARISON:  03/20/2022 FINDINGS: Lungs are clear save for a pulmonary arterial pressure monitoring unit within the left lower lobe. No pneumothorax or pleural effusion.  Cardiac size within normal limits. Pulmonary vascularity is normal. No acute bone abnormality. IMPRESSION: 1. No active cardiopulmonary disease. Electronically Signed   By: Fidela Salisbury M.D.   On: 03/23/2022 00:00        Scheduled Meds:  aspirin EC  81 mg Oral Daily   carvedilol  3.125 mg Oral BID WC   DULoxetine  30 mg Oral QHS   enoxaparin (LOVENOX) injection  1 mg/kg Subcutaneous Q12H   fenofibrate  160 mg Oral Daily   furosemide  40 mg Intravenous Q12H   insulin aspart  0-15 Units Subcutaneous TID WC   insulin aspart  0-5 Units Subcutaneous QHS   insulin aspart  10 Units Subcutaneous TID WC   insulin glargine-yfgn  30 Units Subcutaneous BID   ivabradine  7.5 mg Oral BID WC  levothyroxine  125 mcg Oral Q0600   lipase/protease/amylase  36,000 Units Oral TID   pantoprazole  40 mg Oral Daily   rosuvastatin  40 mg Oral Daily   sildenafil  20 mg Oral TID   sodium bicarbonate  650 mg Oral BID   sodium chloride flush  3 mL Intravenous Q12H   Continuous Infusions:  sodium chloride       LOS: 0 days    Time spent: 35 minutes.     Elmarie Shiley, MD [facility]   If 7PM-7AM, please contact night-coverage [email or web address]  03/24/2022, 7:05 AM

## 2022-03-24 NOTE — Inpatient Diabetes Management (Signed)
Inpatient Diabetes Program Recommendations  AACE/ADA: New Consensus Statement on Inpatient Glycemic Control (2015)  Target Ranges:  Prepandial:   less than 140 mg/dL      Peak postprandial:   less than 180 mg/dL (1-2 hours)      Critically ill patients:  140 - 180 mg/dL   Lab Results  Component Value Date   GLUCAP 420 (H) 03/24/2022   HGBA1C 8.5 (H) 02/13/2022    Review of Glycemic Control  Diabetes history: 3C (per [facility] endo) Outpatient Diabetes medications: U-500 insulin 300 units TID with meals and 110 units at HS Current orders for Inpatient glycemic control: (new) Semglee 40 units BID, Novolog 0-15 units TID, Novolog 0-5 units at HS, Novolog 20 units TID with meals  Inpatient Diabetes Program Recommendations:   Agree with current new insulin orders. Using Semglee and Novolog while in the hospital is easier to control than U-500. Titrate dosages as needed.   Will continue to monitor blood sugars while in the hospital.  Harvel Ricks RN BSN CDE Diabetes Coordinator Pager: [phone]  8am-5pm

## 2022-03-24 NOTE — Consult Note (Addendum)
Advanced Heart Failure Team Consult Note   Primary Physician: Sue Lush, PA-C PCP-Cardiologist:  None  Reason for Consultation: Chest pain, NICM  HPI:    Jennifer [name] is seen today for evaluation of chest pain and NICM at the request of Dr. Tyrell Antonio, Hospitalist.    [name] a 31 y.o.woman with a complex PMHx including astrocytoma s/p resection in 1997 with residual L-sided weakness, type 3c DM, chronic systolic HF EF 67-61%, severe hypertriglyceridemia due LPL deficiency, chronic pancreatitis, NAFLD.  Admitted 11/22/20 with ab pain and diarrhea. Ab CT concerning for vasculitis (no pancreatitis). Her TG were found to be 4030. Echo 9/22 EF 20-25% Started on DBA 2.5. AHF consulted for further management. Milrinone added to augment diuresis. Has had multiple admissions for pancreatitis, AKI, hyperglycemia and electrolyte imbalances since.    Has been followed by Paramedicine in the community. Has been seen in HF clinic several times over the last few months. Multiple discrepancies with medications noted at visits and misses meds at times. Several AKIs after adjusting diuretics (including self adjustments). Has used Furoscix (poor response), metolazone and torsemide without perceived improvement in HF symptoms.   [facility]ed [facility] 09[facility]06. HS troponin 33>56>68. Felt to be mildly volume overloaded after self holding diuretics and given 80 mg lasix IV X 1. CP not felt to be d/t angina, more likely 2/2 volume overload.  9/23 admitted with volume overload in setting of AKI and RV failure. Req milrinone. Underwent cardiomems placement.   Was recently admitted 12/18-12/22 with acute hypoxic respiratory 2/2 Covid-19, treated with steroids and remdesevir.   Patient presented to the ED with chest pain and SOB. Was also hypoglycemic, had not been using her Dexcom. CXR normal. Abd XR: Mild gaseous distention of the stomach. Prominent liver suspicious for hepatomegaly. HsTrop 14>11, CBG  46, K 2.1, Cr 1.26,  tachycardic and covid + (original + 12/18). K replaced.  Cardiomems monitored in clinic, most recent (12/14) cardiomems up to 28 (PAD goal 17). Instructions to adjust diuretics were provided however she did not make any adjustments. Hasn't been compliant with sending in her readings. Husband is brining in pillow today to send reading. Reports compliance with all other home meds. Cough has been very bad 2/2 covid, correlated with chest pain on admission. SOB with activity. Reports diet hasn't been too bad despite holidays. Does report increased fluid intake with covid. No CP.   Cardiac studies reviewed:  Echo 9/23: EF 50-55%, RV normal. mild MR, mild-mod TR Echo 5/23: EF 45-50%, RV normal, mild-mod MR, RVSP 30. Only mild TR RHC 5/23: RA 12 prominent V waves, Fick cardiac output/index = 4.4/2.8 Echo (10/22): EF up to 45-50%, grade II DD, RV ok. LifeVest off. Echo 11/28/20 EF 20-25%, moderate RV dysfunction. 9/22 cMRI c/w prior myocarditis vs sarcoid (see below). LVEF 29%. RV normal.  9/22 R/LHC showing single vessel RCA occlusion, RA 1, LVEDP 17, Fick CO 5.6/CI 3.88.   Home Medications Prior to Admission medications   Medication Sig Start Date End Date Taking? Authorizing Provider  albuterol (VENTOLIN HFA) 108 (90 Base) MCG/ACT inhaler Inhale 1-2 puffs into the lungs every 6 (six) hours as needed for wheezing or shortness of breath. 12/31/18  Yes [provider]  aspirin EC 81 MG EC tablet Take 1 tablet (81 mg total) by mouth daily. Swallow whole. 12/07/20  Yes Nicole Kindred A, DO  carvedilol (COREG) 3.125 MG tablet Take 1 tablet (3.125 mg total) by mouth 2 (two) times daily with a  meal. 09/13/21  Yes [place], Maricela Bo, FNP  dicyclomine (BENTYL) 10 MG capsule Take 10 mg by mouth daily.   Yes [provider]  DULoxetine (CYMBALTA) 30 MG capsule Take 30 mg by mouth at bedtime. 01/28/19 12/16/21 Yes [provider]  erythromycin ophthalmic ointment    Yes  [provider]  estradiol (ESTRACE) 0.5 MG tablet Take 0.5 mg by mouth daily. 11/17/21  Yes [provider]  Evolocumab (REPATHA) 140 MG/ML SOSY Inject 140 mg into the skin See admin instructions. Inject 140 mg subcutaneously every other Wednesday evening   Yes [provider]  fenofibrate 160 MG tablet Take 1 tablet (160 mg total) by mouth daily. 12/07/20  Yes Nicole Kindred A, DO  insulin regular human CONCENTRATED (HUMULIN R) 500 UNIT/ML injection Inject 100-260 Units into the skin See admin instructions. 270 units 3 times daily with each meal, 100 units at bedtime per patient   Yes [provider]  ivabradine (CORLANOR) 7.5 MG TABS tablet Take 1 tablet (7.5 mg total) by mouth 2 (two) times daily with a meal. 08/02/21  Yes Anais Koenen, Shaune Pascal, MD  levothyroxine (SYNTHROID) 125 MCG tablet Take 1 tablet (125 mcg total) by mouth daily at 6 (six) AM. 12/07/20  Yes Nicole Kindred A, DO  metolazone (ZAROXOLYN) 2.5 MG tablet Take 2.5 mg by mouth once a week.   Yes [provider]  ondansetron (ZOFRAN) 4 MG tablet Take 4 mg by mouth every 8 (eight) hours as needed for nausea or vomiting. 12/19/18  Yes [provider]  pantoprazole (PROTONIX) 40 MG tablet Take 40 mg by mouth daily. 12/24/19  Yes [provider]  Potassium Chloride ER 20 MEQ TBCR Take 40 mEq by mouth once a week. 10/24/21  Yes [provider]  pregabalin (LYRICA) 300 MG capsule Take 300 mg by mouth 2 (two) times daily.   Yes [provider]  prochlorperazine (COMPAZINE) 5 MG tablet Take 5 mg by mouth every 6 (six) hours as needed for nausea or vomiting.   Yes [provider]  rosuvastatin (CRESTOR) 40 MG tablet Take 40 mg by mouth daily. 08/09/21  Yes [provider]  torsemide (DEMADEX) 20 MG tablet Take 60 mg by mouth in the morning and at bedtime. 09/27/21  Yes [provider]  tretinoin (RETIN-A) 0.05 % cream Apply 1 application topically  at bedtime. 01/17/21  Yes [provider]  [name] 1/20 1-20 MG-MCG tablet Take 1 tablet by mouth daily. 11/01/21   [provider]  medroxyPROGESTERone (PROVERA) 10 MG tablet Take 10 mg by mouth daily. 11/17/21   [provider]  nitroGLYCERIN (NITROSTAT) 0.4 MG SL tablet Place 1 tablet (0.4 mg total) under the tongue every 5 (five) minutes as needed for up to 25 days for chest pain. Patient not taking: Reported on 11/12/2021 06/16/21 08/03/22  Tyarra Nolton, Shaune Pascal, MD  ofloxacin (OCUFLOX) 0.3 % ophthalmic solution Place 1 drop into both eyes daily as needed (only take after eye injections per patient). 09/02/20   [provider]    Past Medical History: Past Medical History:  Diagnosis Date   Brain tumor ([place]) 03/29/1995   astrocytoma   CHF (congestive heart failure) ([place])    Cholesterosis    DM (diabetes mellitus) ([place]) 10/10/2018   Fatty liver    HTN (hypertension) 10/10/2018   Hypothyroidism 10/10/2018   Lipoprotein deficiency    Lung disease    longevity long term   Pancreatitis    Polycystic ovary syndrome  Past Surgical History: Past Surgical History:  Procedure Laterality Date   ABDOMINAL SURGERY     pt states during miscarriage got her intestine   BRAIN SURGERY     EYE MUSCLE SURGERY Right 03/28/2014   PRESSURE SENSOR/CARDIOMEMS N/A 12/06/2021   Procedure: PRESSURE SENSOR/CARDIOMEMS;  Surgeon: [name], MD;  Location: [facility];  Service: Cardiovascular;  Laterality: N/A;   RIGHT HEART CATH N/A 08/05/2021   Procedure: RIGHT HEART CATH;  Surgeon: [name], MD;  Location: [facility];  Service: Cardiovascular;  Laterality: N/A;   RIGHT HEART CATH N/A 12/06/2021   Procedure: RIGHT HEART CATH;  Surgeon: [name], MD;  Location: [facility];  Service: Cardiovascular;  Laterality: N/A;   RIGHT/LEFT HEART CATH AND CORONARY ANGIOGRAPHY N/A 12/03/2020   Procedure: RIGHT/LEFT HEART CATH AND  CORONARY ANGIOGRAPHY;  Surgeon: [name], MD;  Location: [facility];  Service: Cardiovascular;  Laterality: N/A;   VENTRICULOSTOMY  03/28/1997    Family History: Family History  Problem Relation Age of Onset   Diabetes Mother    Hypertension Mother    Hyperlipidemia Mother    Thyroid disease Mother    Hypertension Father    Diabetes Father    Pancreatic cancer Paternal Aunt    Pancreatic cancer Paternal Uncle    Colon cancer Neg Hx    Esophageal cancer Neg Hx    Stomach cancer Neg Hx     Social History: Social History   Socioeconomic History   Marital status: Married    Spouse name: Not on file   Number of children: 0   Years of education: Not on file   Highest education level: Not on file  Occupational History   Occupation: [employer]  Tobacco Use   Smoking status: Never   Smokeless tobacco: Never  Vaping Use   Vaping Use: Never used  Substance and Sexual Activity   Alcohol use: Never   Drug use: Never   Sexual activity: Yes  Other Topics Concern   Not on file  Social History Narrative   Not on file   Social Determinants of Health   Financial Resource Strain: Low Risk  (12/10/2021)   Overall Financial Resource Strain (CARDIA)    Difficulty of Paying Living Expenses: Not hard at all  Food Insecurity: No Food Insecurity (03/14/2022)   Hunger Vital Sign    Worried About Running Out of Food in the Last Year: Never true    Ran Out of Food in the Last Year: Never true  Transportation Needs: No Transportation Needs (03/14/2022)   PRAPARE - Hydrologist (Medical): No    Lack of Transportation (Non-Medical): No  Physical Activity: Sufficiently Active (12/10/2021)   Exercise Vital Sign    Days of Exercise per Week: 3 days    Minutes of Exercise per Session: 60 min  Stress: No Stress Concern Present (12/10/2021)   [place]    Feeling of Stress : Not at all   Social Connections: [place] (12/10/2021)   Social Connection and Isolation Panel [NHANES]    Frequency of Communication with Friends and Family: More than three times a week    Frequency of Social Gatherings with Friends and Family: More than three times a week    Attends Religious Services: More than 4 times per year    Active Member of [employer] or Organizations: No    Attends [employer]  or Organization Meetings: More than 4 times per year    Marital Status: Married    Allergies:  Allergies  Allergen Reactions   Ketamine Other (See Comments)    In vegetative state for 15 minutes per pt   Erythromycin     unk   Fentanyl Nausea And Vomiting   Maitake Mushroom [[place]] Itching    Itchy throat   Penicillin V Itching    Gi upset   Shellfish Allergy Other (See Comments)    Pt has never had shellfish but tested positive on allergy test. Pt states contrast in CT is okay   Morphine Itching and Rash   Penicillins Rash    Has patient had a PCN reaction causing immediate rash, facial/tongue/throat swelling, SOB or lightheadedness with hypotension: Y Has patient had a PCN reaction causing severe rash involving mucus membranes or skin necrosis: Y Has patient had a PCN reaction that required hospitalization: N Has patient had a PCN reaction occurring within the last 10 years: Y If all of the above answers are "NO", then may proceed with Cephalosporin use.    Prednisone Rash    Objective:    Vital Signs:   Temp:  [97.4 F (36.3 C)-98 F (36.7 C)] 97.4 F (36.3 C) (12/28 0734) Pulse Rate:  [84-105] 95 (12/28 0734) Resp:  [16-17] 16 (12/28 0734) BP: (115-141)/(61-85) 125/71 (12/28 0734) SpO2:  [90 %-97 %] 90 % (12/28 0734) Weight:  [65.9 kg] 65.9 kg (12/27 1800) Last BM Date : 03/23/22  Weight change: Filed Weights   03/23/22 1800  Weight: 65.9 kg    Intake/Output:  No intake or output data in the 24 hours ending 03/24/22 0911    Physical Exam    General:  chronically  ill appearing.  No respiratory difficulty, +cough HEENT: normal Neck: supple. JVD ~8/9 cm. Carotids 2+ bilat; no bruits. No lymphadenopathy or thyromegaly appreciated. Cor: PMI nondisplaced. Regular rate & rhythm. No rubs, gallops or murmurs. Lungs: clear, diminished bases Abdomen: soft, nontender, distended. No bruits or masses. Good bowel sounds. Extremities: no cyanosis, clubbing, rash, edema  Neuro: alert & oriented x 3, cranial nerves grossly intact. moves all 4 extremities w/o difficulty. Affect pleasant.   Telemetry   NSR 90s (Personally reviewed)    EKG    ST 116 bpm   Labs   Basic Metabolic Panel: Recent Labs  Lab 03/18/22 0318 03/20/22 1258 03/23/22 0150 03/23/22 0605 03/23/22 1306 03/24/22 0046  NA 137 133* 142  --  137 130*  K 4.2 3.8 2.1*  --  4.0 4.7  CL 103 99 114*  --  99 101  CO2 21* 23 16*  --  25 18*  GLUCOSE 294* 303* 46*  --  182* 353*  BUN 52* 30* 36*  --  53* 51*  CREATININE 2.29* 1.55* 1.26*  --  1.78* 1.83*  CALCIUM 9.0 9.4 6.7*  --  9.3 8.5*  MG  --   --   --  1.4*  --   --     Liver Function Tests: Recent Labs  Lab 03/18/22 0318  AST 44*  ALT 37  ALKPHOS 82  BILITOT 0.3  PROT 6.7  ALBUMIN 3.4*   No results for input(s): "LIPASE", "AMYLASE" in the last 168 hours.  No results for input(s): "AMMONIA" in the last 168 hours.  CBC: Recent Labs  Lab 03/20/22 1258 03/22/22 2331 03/24/22 0046  WBC 6.8 10.5 10.0  HGB 10.5* 12.8 10.4*  HCT 32.9* 36.9 31.3*  MCV  85.7 83.5 86.9  PLT 212 248 163    Cardiac Enzymes: No results for input(s): "CKTOTAL", "CKMB", "CKMBINDEX", "TROPONINI" in the last 168 hours.  BNP: BNP (last 3 results) Recent Labs    02/12/22 0713 03/04/22 1759 03/20/22 1258  BNP 68.4 170.1* 214.5*    ProBNP (last 3 results) No results for input(s): "PROBNP" in the last 8760 hours.   CBG: Recent Labs  Lab 03/23/22 1100 03/23/22 1304 03/23/22 1900 03/23/22 1950 03/24/22 0725  GLUCAP 90 165* 325* 339*  392*    Coagulation Studies: No results for input(s): "LABPROT", "INR" in the last 72 hours.   Imaging   DG Abd 1 View  Result Date: 03/23/2022 CLINICAL DATA:  Abdominal distension EXAM: ABDOMEN - 1 VIEW COMPARISON:  None Available. FINDINGS: Mild gaseous distention of the stomach. Gas within nondistended large and small bowel. Liver appears prominent. No free air or suspicious calcification. Visualized lung bases clear. IMPRESSION: Mild gaseous distention of the stomach. Prominent liver suspicious for hepatomegaly. Recommend clinical correlation. Electronically Signed   By: Rolm Baptise M.D.   On: 03/23/2022 18:16     Medications:     Current Medications:  aspirin EC  81 mg Oral Daily   carvedilol  3.125 mg Oral BID WC   DULoxetine  30 mg Oral QHS   enoxaparin (LOVENOX) injection  1 mg/kg Subcutaneous Q12H   fenofibrate  160 mg Oral Daily   furosemide  40 mg Intravenous Q12H   guaiFENesin  600 mg Oral BID   insulin aspart  0-15 Units Subcutaneous TID WC   insulin aspart  0-5 Units Subcutaneous QHS   insulin aspart  10 Units Subcutaneous TID WC   insulin glargine-yfgn  30 Units Subcutaneous BID   ivabradine  7.5 mg Oral BID WC   levothyroxine  125 mcg Oral Q0600   lipase/protease/amylase  36,000 Units Oral TID   pantoprazole  40 mg Oral Daily   rosuvastatin  40 mg Oral Daily   sildenafil  20 mg Oral TID   sodium bicarbonate  650 mg Oral BID   sodium chloride flush  3 mL Intravenous Q12H    Infusions:  sodium chloride        Patient Profile   31 y.o. female with chronic systolic CHF/NICM, CAD, severe hypertriglyceridemia, type 3c DM, hx medical noncompliance.  Assessment/Plan   Nonischemic cardiomyopathy, recovered EF, previously with HFrEF (EF 29%) - Echo (5/23): EF 45-50%, RV normal  - Echo 9/23: EF 50-55%, RV normal. mild MR, mild-mod TR - Volume stable on exam, does not appear significantly volume overloaded. Currently on IV lasix 40 BID, will continue  through today. Transition to PO torsemide 40 daily - Cardiomems placed during last admission, last read 12/14: 28 (PAD goal 17) - Continue Coreg 3.125 mg bid. - Continue Corlanor 7.5 mg bid.  - Off Jardiance due to frequent GU infections. Off Entresto with AKI. Off spiro w/ hyperkalemia  - Continue Paramedince.   Chest Pain, hx CAD - Single vessel RCA occlusion (small co-dominant vessel) on cardiac cath 09/08. - Medical management - HS troponin 14>11 - Continue aspirin + statin. - suspect 2/2 pleuritic pain from covid - No chest pain on exam  Covid +  - Covid + 12/18 - Treated with steroids and remdesevir then - Pending V/Q scan   CKD w/ recent AKI -Scr baseline variable with multiple AKIs, typically 1.5-2.0 -Scr this admit 1.26>1.78>1.83 today -Follow with diuresis   5. Pulmonary hypertension - Mild to moderate on RHC  5/23. PVR 3.7 WU. - Can consider cautious trial of selective pulmonary vasodilators when better diuresed.   6. TR - Prominent v-waves in RA tracing on RHC 5/23. - Echo 5/23 shows only mild TR  - mild to mod on 9/23 echo   7. Severe hyperTG - Due to LPL deficiency w/ recent pancreatitis. - Continue fenofibrate  - Triglycerides down to 1500 last admit - Previously offered referral to [facility] for further management. She prefers to continue with her endocrinologist at [facility] who currently manages/follows.   8. Type 3c DM - Follows w/ endocrinology at [facility]. - 2/2 chronic pancreatitis  - x1 hypoglycemic event, now hyperglycemic in 300s, diabetes coordinator following   9. Chronic abdominal swelling/bloating - Seen at [facility] (11/20) for IBS, per chart review. - Had penumatosis intestinalis. She was started on pancreatic enzyme replacement. - EGD (10/21): normal, bx taken to assess for celiac. - CT abdomen (2/22) and RUQ US showed no obvious gallbladder stones. - HIDA (4/22): ended early due to tachypnea, but cystic duct and gallbladder appeared WNL. - CT  abdomen 12/22 unremarkable  - US abdomen 11/25/21: No ascites - suspect primarily fatty liver - Abd XR 12/27: mild gaseous distention    10. Poor Med Compliance - Followed by paramedicine OP   Length of Stay: [place], NP  03/24/2022, 9:11 AM  Advanced Heart Failure Team Pager [phone] (M-F; 7a - 5p)  Please contact [facility] for night-coverage after hours (4p -7a ) and weekends on [email or web address]   Patient seen and examined with the above-signed Advanced Practice Provider and/or Housestaff. I personally reviewed laboratory data, imaging studies and relevant notes. I independently examined the patient and formulated the important aspects of the plan. I have edited the note to reflect any of my changes or salient points. I have personally discussed the plan with the patient and/or family.  Patient well known to me from Clinic. She has significant RV failure. Now admitted with CP and progressive SOB.    COVID +. [place] 12,14,11. ECG stable. BNP 170 -> 214  General: + coughing  HEENT: normal Neck: supple.  JVP hard to see. Doesn;t appear overly elevated  Carotids 2+ bilat; no bruits. No lymphadenopathy or thryomegaly appreciated. Cor: PMI nondisplaced. Regular rate & rhythm. No rubs, gallops or murmurs. Lungs: clear Abdomen: obese  soft, nontender, nondistended. No hepatosplenomegaly. No bruits or masses. Good bowel sounds. Extremities: no cyanosis, clubbing, rash, edema Neuro: alert & orientedx3, cranial nerves grossly intact. moves all 4 extremities w/o difficulty. Affect pleasant  Suspect mild volume at worst. Doubt CP is cardiac. Agree with continuing IV diuresis one more day. Husband will bring Cardiomems reader in to further assess volume status.   Glori Bickers, MD  6:47 PM

## 2022-03-24 NOTE — Progress Notes (Signed)
CBG at 1143 was 420, Dr. Tyrell Antonio made aware and changes to insulin orders made. Insulin given upon arrival of lunch tray.

## 2022-03-24 NOTE — TOC Initial Note (Signed)
Transition of Care [facility]) - Initial/Assessment Note    Patient Details  Name: Jennifer [name] MRN: [id number] Date of Birth: 05/21/1990  Transition of Care [facility]) CM/SW Contact:    Erenest Rasher, RN Phone Number: [phone] 03/24/2022, 5:02 PM  Clinical Narrative:  High Readmission                HF TOC CM spoke to pt via phone. Pt is being followed by HF Paramedicine team outpatient. Pt declined HH. Pt reports she has scale for daily weights. Educated on importance of daily weight. Will continue to follow for dc needs. States she usually does not follow up with PCP due to other specialist she sees.   Expected Discharge Plan: Home/Self Care Barriers to Discharge: Continued Medical Work up   Patient Goals and CMS Choice Patient states their goals for this hospitalization and ongoing recovery are:: wants to feel better so she can go home CMS Medicare.gov Compare Post Acute Care list provided to:: Patient        Expected Discharge Plan and Services   Discharge Planning Services: CM Consult   Living arrangements for the past 2 months: Apartment                                      Prior Living Arrangements/Services Living arrangements for the past 2 months: Apartment Lives with:: Spouse Patient language and need for interpreter reviewed:: Yes Do you feel safe going back to the place where you live?: Yes      Need for Family Participation in Patient Care: No (Comment) Care giver support system in place?: Yes (comment) Current home services: DME (scale) Criminal Activity/Legal Involvement Pertinent to Current Situation/Hospitalization: No - Comment as needed  Activities of Daily Living Home Assistive Devices/Equipment: None ADL Screening (condition at time of admission) Patient's cognitive ability adequate to safely complete daily activities?: Yes Is the patient deaf or have difficulty hearing?: No Does the patient have difficulty seeing, even when wearing  glasses/contacts?: No Does the patient have difficulty concentrating, remembering, or making decisions?: No Patient able to express need for assistance with ADLs?: Yes Does the patient have difficulty dressing or bathing?: No Independently performs ADLs?: Yes (appropriate for developmental age) Does the patient have difficulty walking or climbing stairs?: No Weakness of Legs: None Weakness of Arms/Hands: None  Permission Sought/Granted Permission sought to share information with : Family Supports Permission granted to share information with : Yes, Verbal Permission Granted  Share Information with NAME: Kathi Ludwig     Permission granted to share info w Relationship: husband  Permission granted to share info w Contact Information: [phone]  Emotional Assessment   Attitude/Demeanor/Rapport: Engaged Affect (typically observed): Accepting Orientation: : Oriented to Self, Oriented to Place, Oriented to  Time, Oriented to Situation   Psych Involvement: No (comment)  Admission diagnosis:  Hypokalemia [E87.6] Hypoglycemia [E16.2] Chest pain [R07.9] Patient Active Problem List   Diagnosis Date Noted   Hypokalemia 03/23/2022   COVID 03/15/2022   COVID-19 virus infection 03/14/2022   Hyponatremia 03/14/2022   Positive D dimer 03/14/2022   Stage 3b chronic kidney disease (CKD) ([place]) 03/14/2022   Pulmonary hypertension ([place]) 02/06/2022   NASH (nonalcoholic steatohepatitis) 02/06/2022   Obesity (BMI 30-39.9) 02/06/2022   Heart failure ([place]) 12/03/2021   Acute on chronic systolic CHF (congestive heart failure) ([place]) 12/02/2021   Elevated troponin 12/02/2021   Pancytopenia ([place]) 12/02/2021  Amenorrhea 12/02/2021   Hyperosmolar hyperglycemic state (HHS) ([place]) 08/26/2021   Small intestinal bacterial overgrowth (SIBO) 08/26/2021   Lower extremity edema 08/26/2021   Hyperkalemia 08/14/2021   Hx of insulin dependent diabetes mellitus 08/14/2021   CKD (chronic kidney disease) stage  4, GFR 15-29 ml/min (HCC) 05/13/2021   Chest pain 03/10/2021   Acute on chronic combined systolic and diastolic CHF (congestive heart failure) ([place]) 02/09/2021   History of astrocytoma of brain 02/09/2021   Severe hyperglycemia due to diabetes mellitus ([place]) 01/25/2021   Diarrhea    Elevated transaminase level    Acute combined systolic and diastolic heart failure (HCC)    Nonischemic cardiomyopathy ([place])    Acute decompensated heart failure (HCC)    Elevated liver enzymes    Acute renal failure superimposed on stage 3a chronic kidney disease ([place]) 11/26/2020   Abdominal pain 87/68/1157   Chronic systolic CHF (congestive heart failure) ([place]) 11/23/2020   Essential hypertension 11/23/2020   Hypertriglyceridemia 11/23/2020   Prolonged QT interval 11/23/2020   Type 2 diabetes mellitus with hyperlipidemia ([place]) 10/10/2018   Hypothyroidism 10/10/2018   PCP:  Sue Lush, PA-C Pharmacy:   [facility] [id number] - [address], [place] [address] Phone: [phone] Fax: [phone]  [id number] [address] [place] [address]Phone: [phone]: [phone]    Social Determinants of Health (SDOH) Social History: [place] No Food Insecurity (03/14/2022)  Housing: Low Risk  (03/14/2022)  Transportation Needs: No Transportation Needs (03/14/2022)  Utilities: Not At Risk (03/14/2022)  Alcohol Screen: Low Risk  (12/10/2021)  Depression (PHQ2-9): Low Risk  (12/10/2021)  Financial Resource Strain: Low Risk  (12/10/2021)  Physical Activity: Sufficiently Active (12/10/2021)  Social Connections: Socially Integrated (12/10/2021)  Stress: No Stress Concern Present (12/10/2021)  Tobacco Use: Low Risk  (03/22/2022)   SDOH Interventions:     Readmission Risk Interventions    12/07/2021    4:36 PM  Readmission Risk  Prevention Plan  Transportation Screening Complete  Medication Review ([place] Complete  PCP or Specialist appointment within 3-5 days of discharge Complete  HRI or [place]  SW Recovery Care/Counseling Consult Complete  [place] RockNot Applicable

## 2022-03-25 DIAGNOSIS — E876 Hypokalemia: Secondary | ICD-10-CM | POA: Diagnosis not present

## 2022-03-25 LAB — BASIC METABOLIC PANEL
Anion gap: 12 (ref 5–15)
BUN: 57 mg/dL — ABNORMAL HIGH (ref 6–20)
CO2: 23 mmol/L (ref 22–32)
Calcium: 8.8 mg/dL — ABNORMAL LOW (ref 8.9–10.3)
Chloride: 96 mmol/L — ABNORMAL LOW (ref 98–111)
Creatinine, Ser: 1.99 mg/dL — ABNORMAL HIGH (ref 0.44–1.00)
GFR, Estimated: 34 mL/min — ABNORMAL LOW (ref >60)
Glucose, Bld: 269 mg/dL — ABNORMAL HIGH (ref 70–99)
Potassium: 4.3 mmol/L (ref 3.5–5.1)
Sodium: 131 mmol/L — ABNORMAL LOW (ref 135–145)

## 2022-03-25 LAB — CBC
HCT: 30.3 % — ABNORMAL LOW (ref 36.0–46.0)
Hemoglobin: 9.8 g/dL — ABNORMAL LOW (ref 12.0–15.0)
MCH: 28.2 pg (ref 26.0–34.0)
MCHC: 32.3 g/dL (ref 30.0–36.0)
MCV: 87.1 fL (ref 80.0–100.0)
Platelets: 141 10*3/uL — ABNORMAL LOW (ref 150–400)
RBC: 3.48 MIL/uL — ABNORMAL LOW (ref 3.87–5.11)
RDW: 15.8 % — ABNORMAL HIGH (ref 11.5–15.5)
WBC: 9.7 10*3/uL (ref 4.0–10.5)
nRBC: 0 % (ref 0.0–0.2)

## 2022-03-25 LAB — GLUCOSE, CAPILLARY
Glucose-Capillary: 252 mg/dL — ABNORMAL HIGH (ref 70–99)
Glucose-Capillary: 350 mg/dL — ABNORMAL HIGH (ref 70–99)

## 2022-03-25 LAB — MAGNESIUM: Magnesium: 2.3 mg/dL (ref 1.7–2.4)

## 2022-03-25 MED ORDER — TORSEMIDE 20 MG PO TABS
40.0000 mg | ORAL_TABLET | Freq: Every day | ORAL | Status: DC
  Administered 2022-03-25: 40 mg via ORAL
  Filled 2022-03-25: qty 2

## 2022-03-25 MED ORDER — ENOXAPARIN SODIUM 40 MG/0.4ML IJ SOSY
40.0000 mg | PREFILLED_SYRINGE | Freq: Every day | INTRAMUSCULAR | Status: DC
  Administered 2022-03-25: 40 mg via SUBCUTANEOUS
  Filled 2022-03-25: qty 0.4

## 2022-03-25 MED ORDER — GUAIFENESIN ER 600 MG PO TB12: 600.0000 mg | ORAL_TABLET | Freq: Two times a day (BID) | ORAL | 0 refills | Status: DC

## 2022-03-25 MED ORDER — HUMULIN R U-500 KWIKPEN 500 UNIT/ML ~~LOC~~ SOPN: 50.0000 [IU] | PEN_INJECTOR | SUBCUTANEOUS | 0 refills | Status: DC

## 2022-03-25 MED ORDER — INSULIN ASPART 100 UNIT/ML IJ SOLN
25.0000 [IU] | Freq: Three times a day (TID) | INTRAMUSCULAR | Status: DC
  Administered 2022-03-25: 25 [IU] via SUBCUTANEOUS

## 2022-03-25 MED ORDER — TORSEMIDE 40 MG PO TABS: 40.0000 mg | ORAL_TABLET | Freq: Every day | ORAL | 1 refills | Status: DC

## 2022-03-25 MED ORDER — GUAIFENESIN-DM 100-10 MG/5ML PO SYRP: 10.0000 mL | ORAL_SOLUTION | Freq: Four times a day (QID) | ORAL | 0 refills | Status: DC | PRN

## 2022-03-25 NOTE — Progress Notes (Addendum)
Advanced Heart Failure Rounding Note  PCP-Cardiologist: None   Subjective:    Has cardiomems pillow at bedside now, sent reading last night. Down to 75 (goal 65).   Feels much better this morning. Breathing and cough improved  Objective:   Weight Range: 65.5 kg Body mass index is 31.25 kg/m.   Vital Signs:   Temp:  [97.6 F (36.4 C)-97.8 F (36.6 C)] 97.8 F (36.6 C) (12/29 0738) Pulse Rate:  [71-79] 77 (12/29 0738) Resp:  [16-18] 17 (12/29 0738) BP: (107-124)/(65-76) 107/70 (12/29 0738) SpO2:  [90 %-99 %] 95 % (12/29 0738) Weight:  [65.5 kg] 65.5 kg (12/29 0500) Last BM Date : 03/23/22  Weight change: Filed Weights   03/23/22 1800 03/25/22 0500  Weight: 65.9 kg 65.5 kg    Intake/Output:   Intake/Output Summary (Last 24 hours) at 03/25/2022 0858 Last data filed at 03/24/2022 1816 Gross per 24 hour  Intake --  Output 650 ml  Net -650 ml      Physical Exam   General:  chronically ill appearing.  No respiratory difficulty HEENT: R eye ptosis Neck: supple. JVD not seen, appears flat. Carotids 2+ bilat; no bruits. No lymphadenopathy or thyromegaly appreciated. Cor: PMI nondisplaced. Regular rate & rhythm. No rubs, gallops or murmurs. Lungs: clear Abdomen: soft, nontender, nondistended. No hepatosplenomegaly. No bruits or masses. Good bowel sounds. Extremities: no cyanosis, clubbing, rash, edema  Neuro: alert & oriented x 3, cranial nerves grossly intact. moves all 4 extremities w/o difficulty. Affect pleasant.   Telemetry   NSR 70s (Personally reviewed)    EKG    No new EKG to review  Labs    CBC Recent Labs    03/24/22 0046 03/25/22 0607  WBC 10.0 9.7  HGB 10.4* 9.8*  HCT 31.3* 30.3*  MCV 86.9 87.1  PLT 163 570*   Basic Metabolic Panel Recent Labs    03/24/22 0046 03/24/22 0936 03/24/22 1318 03/25/22 0607  NA 130*  --   --  131*  K 4.7  --   --  4.3  CL 101  --   --  96*  CO2 18*  --   --  23  GLUCOSE 353*  --  360* 269*  BUN  51*  --   --  57*  CREATININE 1.83*  --   --  1.99*  CALCIUM 8.5*  --   --  8.8*  MG  --  2.3  --  2.3   Liver Function Tests No results for input(s): "AST", "ALT", "ALKPHOS", "BILITOT", "PROT", "ALBUMIN" in the last 72 hours. No results for input(s): "LIPASE", "AMYLASE" in the last 72 hours. Cardiac Enzymes No results for input(s): "CKTOTAL", "CKMB", "CKMBINDEX", "TROPONINI" in the last 72 hours.  BNP: BNP (last 3 results) Recent Labs    02/12/22 0713 03/04/22 1759 03/20/22 1258  BNP 68.4 170.1* 214.5*    ProBNP (last 3 results) No results for input(s): "PROBNP" in the last 8760 hours.   D-Dimer No results for input(s): "DDIMER" in the last 72 hours. Hemoglobin A1C No results for input(s): "HGBA1C" in the last 72 hours. Fasting Lipid Panel No results for input(s): "CHOL", "HDL", "LDLCALC", "TRIG", "CHOLHDL", "LDLDIRECT" in the last 72 hours. Thyroid Function Tests No results for input(s): "TSH", "T4TOTAL", "T3FREE", "THYROIDAB" in the last 72 hours.  Invalid input(s): "FREET3"  Other results:   Imaging    NM Pulmonary Perf and Vent  Result Date: 03/24/2022 CLINICAL DATA:  Short of breath.  Concern pulmonary embolism. EXAM:  NUCLEAR MEDICINE PERFUSION LUNG SCAN TECHNIQUE: Perfusion images were obtained in multiple projections after intravenous injection of radiopharmaceutical. Ventilation scans intentionally deferred if perfusion scan and chest x-ray adequate for interpretation during COVID 19 epidemic. RADIOPHARMACEUTICALS:  3.8 mCi Tc-53mMAA IV COMPARISON:  None Available. FINDINGS: No wedge-shaped peripheral perfusion defect within the lungs to suggest acute pulmonary embolism. Cardiac attenuation noted. IMPRESSION: No evidence acute pulmonary embolism. Electronically Signed   By: SSuzy BouchardM.D.   On: 03/24/2022 15:47     Medications:     Scheduled Medications:  aspirin EC  81 mg Oral Daily   carvedilol  3.125 mg Oral BID WC   DULoxetine  30 mg Oral QHS    fenofibrate  160 mg Oral Daily   furosemide  40 mg Intravenous Q12H   guaiFENesin  600 mg Oral BID   insulin aspart  0-15 Units Subcutaneous TID WC   insulin aspart  0-5 Units Subcutaneous QHS   insulin aspart  20 Units Subcutaneous TID WC   insulin glargine-yfgn  40 Units Subcutaneous BID   ivabradine  7.5 mg Oral BID WC   levothyroxine  125 mcg Oral Q0600   lipase/protease/amylase  36,000 Units Oral TID   pantoprazole  40 mg Oral Daily   rosuvastatin  40 mg Oral Daily   sildenafil  20 mg Oral TID   sodium bicarbonate  650 mg Oral BID   sodium chloride flush  3 mL Intravenous Q12H    Infusions:  sodium chloride      PRN Medications: sodium chloride, acetaminophen **OR** acetaminophen, albuterol, guaiFENesin-dextromethorphan, HYDROcodone-acetaminophen, HYDROmorphone (DILAUDID) injection, sodium chloride flush    Patient Profile   31y.o. female with chronic systolic CHF/NICM, CAD, severe hypertriglyceridemia, type 3c DM, hx medical noncompliance.   Assessment/Plan   Nonischemic cardiomyopathy, recovered EF, previously with HFrEF (EF 29%) - Echo (5/23): EF 45-50%, RV normal  - Echo 9/23: EF 50-55%, RV normal. mild MR, mild-mod TR - Volume stable on exam, does not appear significantly volume overloaded. Transition to PO torsemide 40 daily - Cardiomems placed during last admission, last read 12/14: 28 (PAD goal 17). Now has at bedside, sent reading last night, down to 20  - Continue Coreg 3.125 mg bid. - Continue Corlanor 7.5 mg bid.  - Off Jardiance due to frequent GU infections. Off Entresto with AKI. Off spiro w/ hyperkalemia  - Continue Paramedince.    Chest Pain, hx CAD - Single vessel RCA occlusion (small co-dominant vessel) on cardiac cath 09/08. - Medical management - HS troponin 14>11 - Continue aspirin + statin. - suspect 2/2 pleuritic pain from covid - No chest pain on exam   Covid +  - Covid + 12/18 - Treated with steroids and remdesevir then -  V/Q  scan (-)    CKD w/ recent AKI -Scr baseline variable with multiple AKIs, typically 1.5-2.0 -Scr this admit 1.26>1.78>1.83> 1.99 today -Follow with diuresis   5. Pulmonary hypertension - Mild to moderate on RHC 5/23. PVR 3.7 WU. - Can consider cautious trial of selective pulmonary vasodilators when better diuresed.   6. TR - Prominent v-waves in RA tracing on RHC 5/23. - Echo 5/23 shows only mild TR  - mild to mod on 9/23 echo   7. Severe hyperTG - Due to LPL deficiency w/ recent pancreatitis. - Continue fenofibrate  - Triglycerides down to 1500 last admit - [facility]. [facility]  8. Type 3c DM - Follows w/ endocrinology at [facility]. - 2/2 chronic pancreatitis  - Diabetes coordinator following   9. Chronic abdominal swelling/bloating - Seen at [facility] (11/20) for IBS, per chart review. - Had penumatosis intestinalis. She was started on pancreatic enzyme replacement. - EGD (10/21): normal, bx taken to assess for celiac. - CT abdomen (2/22) and RUQ US showed no obvious gallbladder stones. - HIDA (4/22): ended early due to tachypnea, but cystic duct and gallbladder appeared WNL. - CT abdomen 12/22 unremarkable  - US abdomen 11/25/21: No ascites - suspect primarily fatty liver - Abd XR 12/27: mild gaseous distention    10. Poor Med Compliance - Followed by paramedicine OP  Length of Stay: [place], NP  03/25/2022, 8:58 AM  Advanced Heart Failure Team Pager [phone] (M-F; 7a - 5p)  Please contact [facility] for night-coverage after hours (5p -7a ) and weekends on [email or web address]  Patient discussed with NP personally. Chart reviewed. Agree with plan for d/c.   Patient left before I could examine her. We will arrange f/u in HF Clinic.   Glori Bickers, MD  3:51 PM

## 2022-03-25 NOTE — Discharge Summary (Addendum)
Physician Discharge Summary   Patient: Jennifer [name] MRN: [id number] DOB: 01-Jun-1990  Admit date:     03/22/2022  Discharge date: 03/25/22  Discharge Physician: Elmarie Shiley   PCP: Sue Lush, PA-C   Recommendations at discharge:    Further management of Diabetes.    Discharge Diagnoses: Principal Problem:   Hypokalemia Active Problems:   Chest pain  Resolved Problems:   * No resolved hospital problems. *  Hospital Course: Jennifer [name] is a 31 y.o. 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  She had a recent diagnosis of COVID 32, and recently discharged from the hospital 12/22.  She presented with low blood sugar and chest pain.  Chest pain in the middle of the chest, dull in quality associated with deep breath, she also reported wheezing.  She saw that she was volume overload. He continued to cough.   Evaluation after operation she was found to have potassium of 2.1, CO2 16, glucose 46, calcium 6.7, magnesium 1.4   Admitted for further evaluation.  She received IV Lasix at [facility].  Potassium has improved to 4.0.  CBG increasing.  Chest x-ray no active cardiopulmonary diseases  Assessment and Plan: 1-Diabetes type 3 c, Hypoglycemia.  Resume  U 500. 100 units with meals and 50 units at hs.  CBG improved. Cbg on admission 45.  SSI. Treated inpatient with semglee 40 units BID, 10 units meal coverage,    2-Chest pain;  Recent covid, pleuritic chest pain. Will cover with Lovenox until VQ scan results.   V-Q scan. Negative for PE Troponin negative.   EKG: Sinus Tachycardia.  Suspect chest pain related to cough. Patient stable for discharge.   3-Non Ischemic cardiomyopathy; recover EF; Acute HF exacerbation.   Continue with ivabradine and sildenafil.  Treated  with IV lasix.  Monitor out put.  Has Cardiomems, Follow with Dr Haroldine Laws  Elevation BNP, BL crackles. Abdominal distension  Troponin negative.  Strict I and O. Negative 1.5 L HF team consulted. resume torsemide today.  Stable for discharge.   CKD stage stage IV. Cr baeline 2.2 Monitor on lasix.  Stable resume torsemide.   Hypokalemia; replaced.  Hypomagnesemia; Replaced. Normalized.  Hypothyroidism; Continue with synthroid.  Hepatomegalia; needs weight loss.  Abdominal distension; KUB, Mild gaseous distension stomach.  Recent Covid. Diagnosis 12/18. Required oxygen and steroids, and remdesivir last admission.  No active infection..  Cough stable.  COVID-19 Labs Obesity: needs life style modifications.             Consultants: HF team  Procedures performed: none Disposition: Home Diet recommendation:  Discharge Diet Orders (From admission, onward)     Start     Ordered   03/25/22 0000  Diet - low sodium heart healthy        03/25/22 1221           Carb modified diet DISCHARGE MEDICATION: Allergies as of 03/25/2022       Reactions   Ketamine Other (See Comments)   In vegetative state for 15 minutes per pt   Erythromycin    unk   Fentanyl Nausea And Vomiting   Maitake Mushroom [maitake] Itching   Itchy throat   Penicillin V Itching   Gi upset   Shellfish Allergy Other (See Comments)   Pt has never had shellfish  but tested positive on allergy test. Pt states contrast in CT is okay   Morphine Itching, Rash   Penicillins Rash   Has patient had a PCN reaction causing immediate rash, facial/tongue/throat swelling, SOB or lightheadedness with hypotension: Y Has patient had a PCN reaction causing severe rash involving mucus membranes or skin necrosis: Y Has patient had a PCN reaction that required hospitalization: N Has patient had a PCN reaction occurring within the last 10 years: Y If all of the above  answers are "NO", then may proceed with Cephalosporin use.   Prednisone Rash        Medication List     TAKE these medications    albuterol 108 (90 Base) MCG/ACT inhaler Commonly known as: VENTOLIN HFA Inhale 1-2 puffs into the lungs every 6 (six) hours as needed for wheezing or shortness of breath.   aspirin EC 81 MG tablet Take 1 tablet (81 mg total) by mouth daily. Swallow whole.   carvedilol 3.125 MG tablet Commonly known as: COREG Take 1 tablet (3.125 mg total) by mouth 2 (two) times daily with a meal.   Creon 36000 UNITS Cpep capsule Generic drug: lipase/protease/amylase Take 36,000 Units by mouth 3 (three) times daily.   DULoxetine 30 MG capsule Commonly known as: CYMBALTA Take 30 mg by mouth at bedtime.   ergocalciferol 1.25 MG (50000 UT) capsule Commonly known as: VITAMIN D2 Take 50,000 Units by mouth once a week.   fenofibrate 160 MG tablet Take 1 tablet (160 mg total) by mouth daily.   guaiFENesin 600 MG 12 hr tablet Commonly known as: MUCINEX Take 1 tablet (600 mg total) by mouth 2 (two) times daily.   guaiFENesin-dextromethorphan 100-10 MG/5ML syrup Commonly known as: ROBITUSSIN DM Take 10 mLs by mouth every 6 (six) hours as needed for cough.   [name] 1/20 1-20 MG-MCG tablet Generic drug: norethindrone-ethinyl estradiol-FE Take 1 tablet by mouth daily.   HumuLIN R U-500 KwikPen 500 UNIT/ML KwikPen Generic drug: insulin regular human CONCENTRATED Inject 50-100 Units into the skin See admin instructions. 100 units at every meal and 50 units at bedtime What changed:  how much to take additional instructions   Insulin Pen Needle 32G X 4 MM Misc 1 Device by Does not apply route QID. For use with insulin pens   ivabradine 7.5 MG Tabs tablet Commonly known as: CORLANOR Take 1 tablet (7.5 mg total) by mouth 2 (two) times daily with a meal.   levocetirizine 5 MG tablet Commonly known as: XYZAL Take 5 mg by mouth daily.   levothyroxine 125 MCG  tablet Commonly known as: SYNTHROID Take 1 tablet (125 mcg total) by mouth daily at 6 (six) AM.   pantoprazole 40 MG tablet Commonly known as: PROTONIX Take 40 mg by mouth daily.   pregabalin 150 MG capsule Commonly known as: LYRICA Take 1 capsule (150 mg total) by mouth at bedtime. What changed:  how much to take when to take this   prochlorperazine 5 MG tablet Commonly known as: COMPAZINE Take 5 mg by mouth daily.   Repatha 140 MG/ML Sosy Generic drug: Evolocumab Inject 140 mg into the skin See admin instructions. Inject 140 mg subcutaneously every other Sunday evening   rosuvastatin 40 MG tablet Commonly known as: CRESTOR Take 40 mg by mouth daily.   sildenafil 20 MG tablet Commonly known as: REVATIO Take 1 tablet (20 mg total) by mouth 3 (three) times daily.   sodium bicarbonate 650 MG tablet Take 650 mg by mouth 2 (  two) times daily.   Torsemide 40 MG Tabs Take 40 mg by mouth daily. Start taking on: March 26, 2022   tretinoin 0.05 % cream Commonly known as: RETIN-A Apply 1 application topically at bedtime.        Discharge Exam: Filed Weights   03/23/22 1800 03/25/22 0500  Weight: 65.9 kg 65.5 kg   General; NAD  Condition at discharge: stable  The results of significant diagnostics from this hospitalization (including imaging, microbiology, ancillary and laboratory) are listed below for reference.   Imaging Studies: NM Pulmonary Perf and Vent  Result Date: 03/24/2022 CLINICAL DATA:  Short of breath.  Concern pulmonary embolism. EXAM: NUCLEAR MEDICINE PERFUSION LUNG SCAN TECHNIQUE: Perfusion images were obtained in multiple projections after intravenous injection of radiopharmaceutical. Ventilation scans intentionally deferred if perfusion scan and chest x-ray adequate for interpretation during COVID 19 epidemic. RADIOPHARMACEUTICALS:  3.8 mCi Tc-56mMAA IV COMPARISON:  None Available. FINDINGS: No wedge-shaped peripheral perfusion defect within the  lungs to suggest acute pulmonary embolism. Cardiac attenuation noted. IMPRESSION: No evidence acute pulmonary embolism. Electronically Signed   By: SSuzy BouchardM.D.   On: 03/24/2022 15:47   DG Abd 1 View  Result Date: 03/23/2022 CLINICAL DATA:  Abdominal distension EXAM: ABDOMEN - 1 VIEW COMPARISON:  None Available. FINDINGS: Mild gaseous distention of the stomach. Gas within nondistended large and small bowel. Liver appears prominent. No free air or suspicious calcification. Visualized lung bases clear. IMPRESSION: Mild gaseous distention of the stomach. Prominent liver suspicious for hepatomegaly. Recommend clinical correlation. Electronically Signed   By: KRolm BaptiseM.D.   On: 03/23/2022 18:16   DG Chest 2 View  Result Date: 03/23/2022 CLINICAL DATA:  Chest pain EXAM: CHEST - 2 VIEW COMPARISON:  03/20/2022 FINDINGS: Lungs are clear save for a pulmonary arterial pressure monitoring unit within the left lower lobe. No pneumothorax or pleural effusion. Cardiac size within normal limits. Pulmonary vascularity is normal. No acute bone abnormality. IMPRESSION: 1. No active cardiopulmonary disease. Electronically Signed   By: AFidela SalisburyM.D.   On: 03/23/2022 00:00   DG Chest 2 View  Result Date: 03/20/2022 CLINICAL DATA:  Shortness of breath, chest pain EXAM: CHEST - 2 VIEW COMPARISON:  Previous studies including the examination of 03/14/2022 FINDINGS: Cardiac size is in the upper limits of normal. There are no signs of pulmonary edema or new focal infiltrates. There is no pleural effusion or pneumothorax. There is a monitoring device in the course of left lower lobe pulmonary artery, possibly used to monitor pulmonary arterial pressure. IMPRESSION: No active cardiopulmonary disease. Electronically Signed   By: PElmer PickerM.D.   On: 03/20/2022 14:11   CT ABDOMEN PELVIS W CONTRAST  Result Date: 03/14/2022 CLINICAL DATA:  Abdominal pain, acute. EXAM: CT ABDOMEN AND PELVIS WITH  CONTRAST TECHNIQUE: Multidetector CT imaging of the abdomen and pelvis was performed using the standard protocol following bolus administration of intravenous contrast. RADIATION DOSE REDUCTION: This exam was performed according to the departmental dose-optimization program which includes automated exposure control, adjustment of the mA and/or kV according to patient size and/or use of iterative reconstruction technique. CONTRAST:  845mOMNIPAQUE IOHEXOL 300 MG/ML  SOLN COMPARISON:  02/06/2022 and 03/05/2021 FINDINGS: Lower chest: There are multiple small lung nodules within both lung bases measuring up to 4 mm. These are unchanged from the previous exam from 03/05/2021. No pleural fluid or airspace disease. Hepatobiliary: Subjective hepatic steatosis. No suspicious liver lesion. Gallbladder appears within normal limits. No bile duct dilatation. Pancreas:  Unremarkable. No pancreatic ductal dilatation or surrounding inflammatory changes. Spleen: Normal in size without focal abnormality. Adrenals/Urinary Tract: Normal adrenal glands. No nephrolithiasis, hydronephrosis or kidney mass. Urinary bladder appears within normal limits. Stomach/Bowel: Stomach appears normal. The appendix is visualized and appears normal. No bowel wall thickening, inflammation or distension. Liquid stool noted throughout the colon. Vascular/Lymphatic: Age advanced aortic atherosclerosis. No signs of abdominopelvic adenopathy. Reproductive: The uterus is diminutive or surgically absent. Unremarkable appearance of the ovaries which appears stable from prior study. Other: No free fluid or fluid collection. No signs of pneumoperitoneum. Musculoskeletal: No acute or significant osseous findings. IMPRESSION: 1. No acute findings within the abdomen or pelvis. 2. Liquid stool noted throughout the colon. Correlate for any clinical signs or symptoms of diarrhea. 3. Multiple small lung nodules within both lung bases measuring up to 4 mm. These are  unchanged from 03/05/2021 compatible with a benign process. 4. Subjective hepatic steatosis. 5.  Aortic Atherosclerosis (ICD10-I70.0). Electronically Signed   By: Kerby Moors M.D.   On: 03/14/2022 06:14   DG Chest Portable 1 View  Result Date: 03/14/2022 CLINICAL DATA:  URI and sob EXAM: PORTABLE CHEST 1 VIEW COMPARISON:  Chest x-ray 03/04/2022 FINDINGS: The heart and mediastinal contours are unchanged. CardiaoMEMS noted overlying the left chest. Question left upper lobe airspace opacity. No pulmonary edema. No pleural effusion. No pneumothorax. No acute osseous abnormality. IMPRESSION: Question left upper lobe airspace opacity. Correlate with possible overlying cardiac leads. Finding may represent a true pulmonary finding such as infection or nodule. Followup PA and lateral chest X-ray is recommended in 3-4 weeks following therapy to ensure resolution. Electronically Signed   By: Iven Finn M.D.   On: 03/14/2022 03:23   DG Chest 2 View  Result Date: 03/04/2022 CLINICAL DATA:  10 pound weight gain in the last few days. Chest pain and shortness of breath. EXAM: CHEST - 2 VIEW COMPARISON:  None Available. FINDINGS: Cardiomems device is identified in the expected location of the left PA branch. There is mild cardiac enlargement, stable from prior exam. No pleural effusion identified. Pulmonary vascular congestion. No frank edema. No airspace disease. Visualized osseous structures are unremarkable. IMPRESSION: Mild cardiac enlargement and pulmonary vascular congestion. Electronically Signed   By: Kerby Moors M.D.   On: 03/04/2022 18:18    Microbiology: Results for orders placed or performed during the hospital encounter of 03/22/22  Resp panel by RT-PCR (RSV, Flu A&B, Covid) Anterior Nasal Swab     Status: Abnormal   Collection Time: 03/23/22  4:37 PM   Specimen: Anterior Nasal Swab  Result Value Ref Range Status   SARS Coronavirus 2 by RT PCR POSITIVE (A) NEGATIVE Final    Comment:  (NOTE) SARS-CoV-2 target nucleic acids are DETECTED.  The SARS-CoV-2 RNA is generally detectable in upper respiratory specimens during the acute phase of infection. Positive results are indicative of the presence of the identified virus, but do not rule out bacterial infection or co-infection with other pathogens not detected by the test. Clinical correlation with patient history and other diagnostic information is necessary to determine patient infection status. The expected result is Negative.  Fact Sheet for Patients: EntrepreneurPulse.com.au  Fact Sheet for Healthcare Providers: IncredibleEmployment.be  This test is not yet approved or cleared by the [place] FDA and  has been authorized for detection and/or diagnosis of SARS-CoV-2 by FDA under an Emergency Use Authorization (EUA).  This EUA will remain in effect (meaning this test can be used) for the duration of  the COVID-19 declaration under Section 564(b)(1) of the A ct, 21 U.S.C. section 360bbb-3(b)(1), unless the authorization is terminated or revoked sooner.     Influenza A by PCR NEGATIVE NEGATIVE Final   Influenza B by PCR NEGATIVE NEGATIVE Final    Comment: (NOTE) The Xpert Xpress SARS-CoV-2/FLU/RSV plus assay is intended as an aid in the diagnosis of influenza from Nasopharyngeal swab specimens and should not be used as a sole basis for treatment. Nasal washings and aspirates are unacceptable for Xpert Xpress SARS-CoV-2/FLU/RSV testing.  Fact Sheet for Patients: EntrepreneurPulse.com.au  Fact Sheet for Healthcare Providers: IncredibleEmployment.be  This test is not yet approved or cleared by the [place] FDA and has been authorized for detection and/or diagnosis of SARS-CoV-2 by FDA under an Emergency Use Authorization (EUA). This EUA will remain in effect (meaning this test can be used) for the duration of the COVID-19  declaration under Section 564(b)(1) of the Act, 21 U.S.C. section 360bbb-3(b)(1), unless the authorization is terminated or revoked.     Resp Syncytial Virus by PCR NEGATIVE NEGATIVE Final    Comment: (NOTE) Fact Sheet for Patients: EntrepreneurPulse.com.au  Fact Sheet for Healthcare Providers: IncredibleEmployment.be  This test is not yet approved or cleared by the [place] FDA and has been authorized for detection and/or diagnosis of SARS-CoV-2 by FDA under an Emergency Use Authorization (EUA). This EUA will remain in effect (meaning this test can be used) for the duration of the COVID-19 declaration under Section 564(b)(1) of the Act, 21 U.S.C. section 360bbb-3(b)(1), unless the authorization is terminated or revoked.  Performed at [facility], [place] [address], [place], [place] [address]     Labs: CBC: Recent Labs  Lab 03/20/22 1258 03/22/22 2331 03/24/22 0046 03/25/22 0607  WBC 6.8 10.5 10.0 9.7  HGB 10.5* 12.8 10.4* 9.8*  HCT 32.9* 36.9 31.3* 30.3*  MCV 85.7 83.5 86.9 87.1  PLT 212 248 163 400*   Basic Metabolic Panel: Recent Labs  Lab 03/20/22 1258 03/23/22 0150 03/23/22 0605 03/23/22 1306 03/24/22 0046 03/24/22 0936 03/24/22 1318 03/25/22 0607  NA 133* 142  --  137 130*  --   --  131*  K 3.8 2.1*  --  4.0 4.7  --   --  4.3  CL 99 114*  --  99 101  --   --  96*  CO2 23 16*  --  25 18*  --   --  23  GLUCOSE 303* 46*  --  182* 353*  --  360* 269*  BUN 30* 36*  --  53* 51*  --   --  57*  CREATININE 1.55* 1.26*  --  1.78* 1.83*  --   --  1.99*  CALCIUM 9.4 6.7*  --  9.3 8.5*  --   --  8.8*  MG  --   --  1.4*  --   --  2.3  --  2.3   Liver Function Tests: No results for input(s): "AST", "ALT", "ALKPHOS", "BILITOT", "PROT", "ALBUMIN" in the last 168 hours. CBG: Recent Labs  Lab 03/24/22 1143 03/24/22 1758 03/24/22 2209 03/25/22 0744 03/25/22 1200  GLUCAP 420* 219* 170* 252* 350*    Discharge time  spent: greater than 30 minutes.  Signed: Elmarie Shiley, MD [facility] 03/25/2022

## 2022-03-26 ENCOUNTER — Encounter (HOSPITAL_BASED_OUTPATIENT_CLINIC_OR_DEPARTMENT_OTHER): Payer: Self-pay

## 2022-03-26 ENCOUNTER — Other Ambulatory Visit: Payer: Self-pay

## 2022-03-26 DIAGNOSIS — R112 Nausea with vomiting, unspecified: Secondary | ICD-10-CM | POA: Insufficient documentation

## 2022-03-26 DIAGNOSIS — Z7982 Long term (current) use of aspirin: Secondary | ICD-10-CM | POA: Insufficient documentation

## 2022-03-26 DIAGNOSIS — R109 Unspecified abdominal pain: Secondary | ICD-10-CM | POA: Diagnosis present

## 2022-03-26 DIAGNOSIS — Z794 Long term (current) use of insulin: Secondary | ICD-10-CM | POA: Diagnosis not present

## 2022-03-26 DIAGNOSIS — R1011 Right upper quadrant pain: Secondary | ICD-10-CM | POA: Insufficient documentation

## 2022-03-26 NOTE — ED Triage Notes (Signed)
POV from home with abd pain, chest pain, N/V, denies diarrhea. D/c from [facility] yesterday for hypokalemia and hypoglycemia. Amb to triage, A&O x 4, NAD

## 2022-03-27 ENCOUNTER — Emergency Department (HOSPITAL_BASED_OUTPATIENT_CLINIC_OR_DEPARTMENT_OTHER)
Admission: EM | Admit: 2022-03-27 | Discharge: 2022-03-27 | Disposition: A | Discharge status: Home / Self Care | Payer: BC Managed Care – PPO | Attending: Emergency Medicine | Admitting: Emergency Medicine

## 2022-03-27 DIAGNOSIS — R109 Unspecified abdominal pain: Secondary | ICD-10-CM

## 2022-03-27 DIAGNOSIS — R112 Nausea with vomiting, unspecified: Secondary | ICD-10-CM

## 2022-03-27 LAB — URINALYSIS, ROUTINE W REFLEX MICROSCOPIC
Bilirubin Urine: NEGATIVE
Glucose, UA: NEGATIVE mg/dL
Ketones, ur: NEGATIVE mg/dL
Nitrite: NEGATIVE
Protein, ur: 30 mg/dL — AB
Specific Gravity, Urine: 1.016 (ref 1.005–1.030)
pH: 5.5 (ref 5.0–8.0)

## 2022-03-27 LAB — CBC
HCT: 34.2 % — ABNORMAL LOW (ref 36.0–46.0)
Hemoglobin: 11 g/dL — ABNORMAL LOW (ref 12.0–15.0)
MCH: 27.5 pg (ref 26.0–34.0)
MCHC: 32.2 g/dL (ref 30.0–36.0)
MCV: 85.5 fL (ref 80.0–100.0)
Platelets: 194 10*3/uL (ref 150–400)
RBC: 4 MIL/uL (ref 3.87–5.11)
RDW: 15.4 % (ref 11.5–15.5)
WBC: 11.4 10*3/uL — ABNORMAL HIGH (ref 4.0–10.5)
nRBC: 0 % (ref 0.0–0.2)

## 2022-03-27 LAB — BRAIN NATRIURETIC PEPTIDE: B Natriuretic Peptide: 48.4 pg/mL (ref 0.0–100.0)

## 2022-03-27 LAB — CBG MONITORING, ED: Glucose-Capillary: 164 mg/dL — ABNORMAL HIGH (ref 70–99)

## 2022-03-27 LAB — COMPREHENSIVE METABOLIC PANEL
ALT: 62 U/L — ABNORMAL HIGH (ref 0–44)
AST: 47 U/L — ABNORMAL HIGH (ref 15–41)
Albumin: 4.2 g/dL (ref 3.5–5.0)
Alkaline Phosphatase: 156 U/L — ABNORMAL HIGH (ref 38–126)
Anion gap: 10 (ref 5–15)
BUN: 40 mg/dL — ABNORMAL HIGH (ref 6–20)
CO2: 24 mmol/L (ref 22–32)
Calcium: 9.4 mg/dL (ref 8.9–10.3)
Chloride: 104 mmol/L (ref 98–111)
Creatinine, Ser: 1.66 mg/dL — ABNORMAL HIGH (ref 0.44–1.00)
GFR, Estimated: 42 mL/min — ABNORMAL LOW (ref >60)
Glucose, Bld: 158 mg/dL — ABNORMAL HIGH (ref 70–99)
Potassium: 4.6 mmol/L (ref 3.5–5.1)
Sodium: 138 mmol/L (ref 135–145)
Total Bilirubin: 0.4 mg/dL (ref 0.3–1.2)
Total Protein: 7.6 g/dL (ref 6.5–8.1)

## 2022-03-27 LAB — LIPASE, BLOOD: Lipase: 35 U/L (ref 11–51)

## 2022-03-27 LAB — TROPONIN I (HIGH SENSITIVITY)
Troponin I (High Sensitivity): 15 ng/L (ref <18)
Troponin I (High Sensitivity): 14 ng/L (ref <18)

## 2022-03-27 MED ORDER — PROCHLORPERAZINE EDISYLATE 10 MG/2ML IJ SOLN
10.0000 mg | Freq: Once | INTRAMUSCULAR | Status: AC
  Administered 2022-03-27: 10 mg via INTRAVENOUS
  Filled 2022-03-27: qty 2

## 2022-03-27 MED ORDER — HYDROMORPHONE HCL 1 MG/ML IJ SOLN
1.0000 mg | Freq: Once | INTRAMUSCULAR | Status: AC
  Administered 2022-03-27: 1 mg via INTRAVENOUS
  Filled 2022-03-27: qty 1

## 2022-03-27 MED ORDER — ONDANSETRON HCL 4 MG/2ML IJ SOLN: 4.0000 mg | Freq: Once | INTRAMUSCULAR | Status: DC

## 2022-03-27 NOTE — ED Provider Notes (Signed)
[facility]  Provider Note  CSN: [id number] Arrival date & time: 03/26/22 2316  History Chief Complaint  Patient presents with   Chest Pain   Abdominal Pain    Jennifer [name] is a 31 y.o. female with multiple chronic medical problems recently admitted for hypoglycemia and hypokalemia, had K repleted and ultimately discharged home 12/29. She reports she has had R upper abdominal pain and vomiting since then, neither of which are uncommon for her. Some chest pain she attributed to her abdominal discomfort. She has had pancreatitis in the past but this feels different. No fever. No diarrhea.    Home Medications Prior to Admission medications   Medication Sig Start Date End Date Taking? Authorizing Provider  albuterol (VENTOLIN HFA) 108 (90 Base) MCG/ACT inhaler Inhale 1-2 puffs into the lungs every 6 (six) hours as needed for wheezing or shortness of breath. 12/31/18   [provider]  aspirin EC 81 MG EC tablet Take 1 tablet (81 mg total) by mouth daily. Swallow whole. 12/07/20   Ezekiel Slocumb, DO  carvedilol (COREG) 3.125 MG tablet Take 1 tablet (3.125 mg total) by mouth 2 (two) times daily with a meal. 02/13/22 03/24/22  Donne Hazel, MD  CREON [phone] units CPEP capsule Take 36,000 Units by mouth 3 (three) times daily.    [provider]  DULoxetine (CYMBALTA) 30 MG capsule Take 30 mg by mouth at bedtime. 01/28/19 01/27/23  [provider]  ergocalciferol (VITAMIN D2) 1.25 MG (50000 UT) capsule Take 50,000 Units by mouth once a week.    [provider]  Evolocumab (REPATHA) 140 MG/ML SOSY Inject 140 mg into the skin See admin instructions. Inject 140 mg subcutaneously every other Sunday evening    [provider]  fenofibrate 160 MG tablet Take 1 tablet (160 mg total) by mouth daily. 12/07/20   Ezekiel Slocumb, DO  guaiFENesin (MUCINEX) 600 MG 12 hr tablet Take 1 tablet (600 mg total) by mouth 2 (two)  times daily. 03/25/22   Regalado, Belkys A, MD  guaiFENesin-dextromethorphan (ROBITUSSIN DM) 100-10 MG/5ML syrup Take 10 mLs by mouth every 6 (six) hours as needed for cough. 03/25/22   Regalado, Belkys A, MD  HUMULIN R U-500 KWIKPEN 500 UNIT/ML KwikPen Inject 50-100 Units into the skin See admin instructions. 100 units at every meal and 50 units at bedtime 03/25/22   Regalado, Belkys A, MD  Insulin Pen Needle 32G X 4 MM MISC 1 Device by Does not apply route QID. For use with insulin pens 02/13/22   Donne Hazel, MD  ivabradine (CORLANOR) 7.5 MG TABS tablet Take 1 tablet (7.5 mg total) by mouth 2 (two) times daily with a meal. 08/02/21   [name], Shaune Pascal, MD  levocetirizine (XYZAL) 5 MG tablet Take 5 mg by mouth daily. 03/05/22   [provider]  levothyroxine (SYNTHROID) 125 MCG tablet Take 1 tablet (125 mcg total) by mouth daily at 6 (six) AM. 12/07/20   Ezekiel Slocumb, DO  norethindrone-ethinyl estradiol-FE ([name] 1/20) 1-20 MG-MCG tablet Take 1 tablet by mouth daily.    [provider]  pantoprazole (PROTONIX) 40 MG tablet Take 40 mg by mouth daily. 12/24/19   [provider]  pregabalin (LYRICA) 150 MG capsule Take 1 capsule (150 mg total) by mouth at bedtime. Patient taking differently: Take 300 mg by mouth 2 (two) times daily. 12/12/21   Domenic Polite, MD  prochlorperazine (COMPAZINE) 5 MG tablet Take 5 mg by mouth  daily.    [provider]  rosuvastatin (CRESTOR) 40 MG tablet Take 40 mg by mouth daily. 08/09/21   [provider]  sildenafil (REVATIO) 20 MG tablet Take 1 tablet (20 mg total) by mouth 3 (three) times daily. 12/15/21   [name], Shaune Pascal, MD  sodium bicarbonate 650 MG tablet Take 650 mg by mouth 2 (two) times daily.    [provider]  torsemide 40 MG TABS Take 40 mg by mouth daily. 03/26/22   Regalado, Belkys A, MD  tretinoin (RETIN-A) 0.05 % cream Apply 1 application topically at bedtime. 01/17/21   [provider]     Allergies    Ketamine, Erythromycin, Fentanyl, Maitake mushroom [maitake], Penicillin v, Shellfish allergy, Morphine, Penicillins, and Prednisone   Review of Systems   Review of Systems Please see HPI for pertinent positives and negatives  Physical Exam BP (!) 127/101   Pulse (!) 107   Temp 98.2 F (36.8 C) (Oral)   Resp (!) 22   Ht 4' 9"  (1.448 m)   Wt 65.5 kg   LMP  (LMP Unknown) Comment: no MP in over a year per pt  SpO2 100%   BMI 31.25 kg/m   Physical Exam Vitals and nursing note reviewed.  Constitutional:      Appearance: Normal appearance.  HENT:     Head: Normocephalic and atraumatic.     Nose: Nose normal.     Mouth/Throat:     Mouth: Mucous membranes are moist.  Eyes:     Extraocular Movements: Extraocular movements intact.     Conjunctiva/sclera: Conjunctivae normal.  Cardiovascular:     Rate and Rhythm: Normal rate.  Pulmonary:     Effort: Pulmonary effort is normal.     Breath sounds: Normal breath sounds.  Abdominal:     General: Abdomen is protuberant.     Palpations: Abdomen is soft.     Tenderness: There is no abdominal tenderness. There is no guarding. Negative signs include Murphy's sign and McBurney's sign.  Musculoskeletal:        General: No swelling. Normal range of motion.     Cervical back: Neck supple.  Skin:    General: Skin is warm and dry.  Neurological:     General: No focal deficit present.     Mental Status: [name] alert.  Psychiatric:        Mood and Affect: Mood normal.     ED Results / Procedures / Treatments   EKG EKG Interpretation  Date/Time:  Saturday March 26 2022 23:31:24 EST Ventricular Rate:  100 PR Interval:  114 QRS Duration: 92 QT Interval:  376 QTC Calculation: 485 R Axis:   41 Text Interpretation: Normal sinus rhythm Nonspecific ST and T wave abnormality Prolonged QT Abnormal ECG When compared with ECG of 22-Mar-2022 23:33, No significant change since last tracing Confirmed by  Calvert Cantor [phone]) on 03/27/2022 2:50:13 AM  Procedures Procedures  Medications Ordered in the ED Medications  HYDROmorphone (DILAUDID) injection 1 mg (1 mg Intravenous Given 03/27/22 0341)  prochlorperazine (COMPAZINE) injection 10 mg (10 mg Intravenous Given 03/27/22 0341)    Initial Impression and Plan  Patient here with nonspecific abdominal pain and vomiting. Has had similar before. Has compazine at home. Patient had labs done in triage showing unremarkable CBC, CMP, lipase and UA. Trop #1 and BNP also normal. Repeat trop is pending. She has a benign abdomen, no concern for acute surgical process or indication for emergent imaging. [name] requesting  pain and nausea meds for comfort. Anticipate discharge if repeat Trop remains unchanged.   ED Course   Clinical Course as of 03/27/22 0349  Sun Mar 27, 2022  7124 Repeat trop remains normal. Plan discharge as above. PCP follow up, RTED for any other concerns.   [CS]    Clinical Course User Index [CS] Truddie Hidden, MD     MDM Rules/Calculators/A&P Medical Decision Making Given presenting complaint, I considered that admission might be necessary. After review of results from ED lab and/or imaging studies, admission to the hospital is not indicated at this time.    Problems Addressed: Abdominal pain, unspecified abdominal location: chronic illness or injury with exacerbation, progression, or side effects of treatment Nausea and vomiting, unspecified vomiting type: chronic illness or injury with exacerbation, progression, or side effects of treatment  Amount and/or Complexity of Data Reviewed Labs: ordered. Decision-making details documented in ED Course. ECG/medicine tests: ordered and independent interpretation performed. Decision-making details documented in ED Course.  Risk Prescription drug management. Parenteral controlled substances. Decision regarding hospitalization.    Final Clinical Impression(s) / ED  Diagnoses Final diagnoses:  Abdominal pain, unspecified abdominal location  Nausea and vomiting, unspecified vomiting type    Rx / DC Orders ED Discharge Orders     None        Truddie Hidden, MD 03/27/22 [phone]

## 2022-03-28 DIAGNOSIS — Z419 Encounter for procedure for purposes other than remedying health state, unspecified: Secondary | ICD-10-CM | POA: Diagnosis not present

## 2022-03-29 ENCOUNTER — Encounter (HOSPITAL_BASED_OUTPATIENT_CLINIC_OR_DEPARTMENT_OTHER): Payer: Self-pay

## 2022-03-29 ENCOUNTER — Other Ambulatory Visit: Payer: Self-pay

## 2022-03-29 DIAGNOSIS — Z79899 Other long term (current) drug therapy: Secondary | ICD-10-CM | POA: Diagnosis not present

## 2022-03-29 DIAGNOSIS — I110 Hypertensive heart disease with heart failure: Secondary | ICD-10-CM | POA: Diagnosis not present

## 2022-03-29 DIAGNOSIS — Z794 Long term (current) use of insulin: Secondary | ICD-10-CM | POA: Insufficient documentation

## 2022-03-29 DIAGNOSIS — R109 Unspecified abdominal pain: Secondary | ICD-10-CM | POA: Diagnosis not present

## 2022-03-29 DIAGNOSIS — I509 Heart failure, unspecified: Secondary | ICD-10-CM | POA: Insufficient documentation

## 2022-03-29 DIAGNOSIS — Z7982 Long term (current) use of aspirin: Secondary | ICD-10-CM | POA: Insufficient documentation

## 2022-03-29 DIAGNOSIS — E119 Type 2 diabetes mellitus without complications: Secondary | ICD-10-CM | POA: Diagnosis not present

## 2022-03-29 LAB — URINALYSIS, ROUTINE W REFLEX MICROSCOPIC
Bacteria, UA: NONE SEEN
Bilirubin Urine: NEGATIVE
Glucose, UA: >1000 mg/dL — AB
Ketones, ur: NEGATIVE mg/dL
Leukocytes,Ua: NEGATIVE
Nitrite: NEGATIVE
Protein, ur: 30 mg/dL — AB
Specific Gravity, Urine: 1.016 (ref 1.005–1.030)
pH: 5.5 (ref 5.0–8.0)

## 2022-03-29 LAB — PREGNANCY, URINE: Preg Test, Ur: NEGATIVE

## 2022-03-29 NOTE — ED Triage Notes (Signed)
Pt states she is having pain on both sides, worse on R, going to back- started 5/6 days ago, worsening since. States she's "not really been able to pee" since. Also endorses nausea

## 2022-03-30 ENCOUNTER — Emergency Department (HOSPITAL_BASED_OUTPATIENT_CLINIC_OR_DEPARTMENT_OTHER)
Admission: EM | Admit: 2022-03-30 | Discharge: 2022-03-30 | Disposition: A | Discharge status: Home / Self Care | Payer: Medicaid Other | Attending: Emergency Medicine | Admitting: Emergency Medicine

## 2022-03-30 ENCOUNTER — Emergency Department (HOSPITAL_BASED_OUTPATIENT_CLINIC_OR_DEPARTMENT_OTHER): Payer: Medicaid Other

## 2022-03-30 DIAGNOSIS — R109 Unspecified abdominal pain: Secondary | ICD-10-CM

## 2022-03-30 LAB — CBC WITH DIFFERENTIAL/PLATELET
Abs Immature Granulocytes: 0.05 10*3/uL (ref 0.00–0.07)
Basophils Absolute: 0.1 10*3/uL (ref 0.0–0.1)
Basophils Relative: 1 %
Eosinophils Absolute: 0.2 10*3/uL (ref 0.0–0.5)
Eosinophils Relative: 2 %
HCT: 34.6 % — ABNORMAL LOW (ref 36.0–46.0)
Hemoglobin: 11.3 g/dL — ABNORMAL LOW (ref 12.0–15.0)
Immature Granulocytes: 1 %
Lymphocytes Relative: 19 %
Lymphs Abs: 2 10*3/uL (ref 0.7–4.0)
MCH: 27 pg (ref 26.0–34.0)
MCHC: 32.7 g/dL (ref 30.0–36.0)
MCV: 82.8 fL (ref 80.0–100.0)
Monocytes Absolute: 0.6 10*3/uL (ref 0.1–1.0)
Monocytes Relative: 6 %
Neutro Abs: 7.3 10*3/uL (ref 1.7–7.7)
Neutrophils Relative %: 71 %
Platelets: 234 10*3/uL (ref 150–400)
RBC: 4.18 MIL/uL (ref 3.87–5.11)
RDW: 15.2 % (ref 11.5–15.5)
WBC: 10.1 10*3/uL (ref 4.0–10.5)
nRBC: 0 % (ref 0.0–0.2)

## 2022-03-30 LAB — COMPREHENSIVE METABOLIC PANEL
ALT: 67 U/L — ABNORMAL HIGH (ref 0–44)
AST: 30 U/L (ref 15–41)
Albumin: 4.5 g/dL (ref 3.5–5.0)
Alkaline Phosphatase: 181 U/L — ABNORMAL HIGH (ref 38–126)
Anion gap: 14 (ref 5–15)
BUN: 36 mg/dL — ABNORMAL HIGH (ref 6–20)
CO2: 21 mmol/L — ABNORMAL LOW (ref 22–32)
Calcium: 10.2 mg/dL (ref 8.9–10.3)
Chloride: 95 mmol/L — ABNORMAL LOW (ref 98–111)
Creatinine, Ser: 1.85 mg/dL — ABNORMAL HIGH (ref 0.44–1.00)
GFR, Estimated: 37 mL/min — ABNORMAL LOW (ref >60)
Glucose, Bld: 324 mg/dL — ABNORMAL HIGH (ref 70–99)
Potassium: 4.9 mmol/L (ref 3.5–5.1)
Sodium: 130 mmol/L — ABNORMAL LOW (ref 135–145)
Total Bilirubin: 0.7 mg/dL (ref 0.3–1.2)
Total Protein: 8.1 g/dL (ref 6.5–8.1)

## 2022-03-30 LAB — LIPASE, BLOOD: Lipase: 38 U/L (ref 11–51)

## 2022-03-30 MED ORDER — ONDANSETRON 4 MG PO TBDP
8.0000 mg | ORAL_TABLET | Freq: Once | ORAL | Status: AC
  Administered 2022-03-30: 8 mg via ORAL
  Filled 2022-03-30: qty 2

## 2022-03-30 MED ORDER — ACETAMINOPHEN 500 MG PO TABS
1000.0000 mg | ORAL_TABLET | Freq: Once | ORAL | Status: AC
  Administered 2022-03-30: 1000 mg via ORAL
  Filled 2022-03-30: qty 2

## 2022-03-30 NOTE — ED Provider Notes (Signed)
[facility] Provider Note   CSN: [id number] Arrival date & time: 03/29/22  1946     History  Chief Complaint  Patient presents with   Flank Pain    Jennifer [name] is a 32 y.o. female.  Patient is a 32 year old female with past medical history of astrocytoma of the brain, type 2 diabetes, chronic CHF, hypertension, hyperlipidemia, chronic renal insufficiency, nonischemic cardiomyopathy.  Patient presenting today with complaints of right flank pain.  Pain has been present for the past several days and began in the absence of any injury or trauma.  She describes decreased urination, but denies fevers or chills.  Patient recently admitted for COVID, then a second time for diabetic issues.  The history is provided by the patient.       Home Medications Prior to Admission medications   Medication Sig Start Date End Date Taking? Authorizing Provider  albuterol (VENTOLIN HFA) 108 (90 Base) MCG/ACT inhaler Inhale 1-2 puffs into the lungs every 6 (six) hours as needed for wheezing or shortness of breath. 12/31/18   [provider]  aspirin EC 81 MG EC tablet Take 1 tablet (81 mg total) by mouth daily. Swallow whole. 12/07/20   Ezekiel Slocumb, DO  carvedilol (COREG) 3.125 MG tablet Take 1 tablet (3.125 mg total) by mouth 2 (two) times daily with a meal. 02/13/22 03/24/22  Donne Hazel, MD  CREON [phone] units CPEP capsule Take 36,000 Units by mouth 3 (three) times daily.    [provider]  DULoxetine (CYMBALTA) 30 MG capsule Take 30 mg by mouth at bedtime. 01/28/19 01/27/23  [provider]  ergocalciferol (VITAMIN D2) 1.25 MG (50000 UT) capsule Take 50,000 Units by mouth once a week.    [provider]  Evolocumab (REPATHA) 140 MG/ML SOSY Inject 140 mg into the skin See admin instructions. Inject 140 mg subcutaneously every other Sunday evening    [provider]  fenofibrate 160 MG tablet Take 1 tablet (160  mg total) by mouth daily. 12/07/20   Ezekiel Slocumb, DO  guaiFENesin (MUCINEX) 600 MG 12 hr tablet Take 1 tablet (600 mg total) by mouth 2 (two) times daily. 03/25/22   Regalado, Belkys A, MD  guaiFENesin-dextromethorphan (ROBITUSSIN DM) 100-10 MG/5ML syrup Take 10 mLs by mouth every 6 (six) hours as needed for cough. 03/25/22   Regalado, Belkys A, MD  HUMULIN R U-500 KWIKPEN 500 UNIT/ML KwikPen Inject 50-100 Units into the skin See admin instructions. 100 units at every meal and 50 units at bedtime 03/25/22   Regalado, Belkys A, MD  Insulin Pen Needle 32G X 4 MM MISC 1 Device by Does not apply route QID. For use with insulin pens 02/13/22   Donne Hazel, MD  ivabradine (CORLANOR) 7.5 MG TABS tablet Take 1 tablet (7.5 mg total) by mouth 2 (two) times daily with a meal. 08/02/21   [name], Shaune Pascal, MD  levocetirizine (XYZAL) 5 MG tablet Take 5 mg by mouth daily. 03/05/22   [provider]  levothyroxine (SYNTHROID) 125 MCG tablet Take 1 tablet (125 mcg total) by mouth daily at 6 (six) AM. 12/07/20   Ezekiel Slocumb, DO  norethindrone-ethinyl estradiol-FE ([name] 1/20) 1-20 MG-MCG tablet Take 1 tablet by mouth daily.    [provider]  pantoprazole (PROTONIX) 40 MG tablet Take 40 mg by mouth daily. 12/24/19   [provider]  pregabalin (LYRICA) 150 MG capsule Take 1 capsule (150 mg total) by mouth at bedtime. Patient  taking differently: Take 300 mg by mouth 2 (two) times daily. 12/12/21   Domenic Polite, MD  prochlorperazine (COMPAZINE) 5 MG tablet Take 5 mg by mouth daily.    [provider]  rosuvastatin (CRESTOR) 40 MG tablet Take 40 mg by mouth daily. 08/09/21   [provider]  sildenafil (REVATIO) 20 MG tablet Take 1 tablet (20 mg total) by mouth 3 (three) times daily. 12/15/21   [name], Shaune Pascal, MD  sodium bicarbonate 650 MG tablet Take 650 mg by mouth 2 (two) times daily.    [provider]  torsemide 40 MG TABS Take 40 mg by  mouth daily. 03/26/22   Regalado, Belkys A, MD  tretinoin (RETIN-A) 0.05 % cream Apply 1 application topically at bedtime. 01/17/21   [provider]      Allergies    Ketamine, Erythromycin, Fentanyl, Maitake mushroom [maitake], Penicillin v, Shellfish allergy, Morphine, Penicillins, and Prednisone    Review of Systems   Review of Systems  All other systems reviewed and are negative.   Physical Exam Updated Vital Signs BP (!) 223/131   Pulse (!) 130   Temp 98 F (36.7 C) (Oral)   Resp 16   LMP  (LMP Unknown) Comment: no MP in over a year per pt  SpO2 100%  Physical Exam Vitals and nursing note reviewed.  Constitutional:      General: [name] not in acute distress.    Appearance: [name] well-developed. [name] not diaphoretic.  HENT:     Head: Normocephalic and atraumatic.  Cardiovascular:     Rate and Rhythm: Normal rate and regular rhythm.     Heart sounds: No murmur heard.    No friction rub. No gallop.  Pulmonary:     Effort: Pulmonary effort is normal. No respiratory distress.     Breath sounds: Normal breath sounds. No wheezing.  Abdominal:     General: Bowel sounds are normal. There is no distension.     Palpations: Abdomen is soft.     Tenderness: There is no abdominal tenderness. There is right CVA tenderness. There is no left CVA tenderness, guarding or rebound.  Musculoskeletal:        General: Normal range of motion.     Cervical back: Normal range of motion and neck supple.  Skin:    General: Skin is warm and dry.  Neurological:     General: No focal deficit present.     Mental Status: [name] alert and oriented to person, place, and time.     ED Results / Procedures / Treatments   Labs (all labs ordered are listed, but only abnormal results are displayed) Labs Reviewed  URINALYSIS, ROUTINE W REFLEX MICROSCOPIC - Abnormal; Notable for the following components:      Result Value   Glucose, UA >1,000 (*)    Hgb urine dipstick TRACE (*)     Protein, ur 30 (*)    All other components within normal limits  PREGNANCY, URINE  CBC WITH DIFFERENTIAL/PLATELET  COMPREHENSIVE METABOLIC PANEL  LIPASE, BLOOD    EKG None  Radiology No results found.  Procedures Procedures    Medications Ordered in ED Medications  ondansetron (ZOFRAN-ODT) disintegrating tablet 8 mg (8 mg Oral Given 03/30/22 0309)  acetaminophen (TYLENOL) tablet 1,000 mg (1,000 mg Oral Given 03/30/22 0308)    ED Course/ Medical Decision Making/ A&P  Patient is a 32 year old female with extensive past medical history as described in the HPI.  She presents today  with complaints of right flank pain, the details of which are described in the HPI.  She reports decreased urination, but no dysuria.  Patient arrives here with stable vital signs and is afebrile.  Her abdominal exam reveals tenderness in the right upper quadrant and right flank, but no peritoneal signs.  Workup initiated including CBC, CMP, and lipase.  Laboratory studies unremarkable with the exception of trivial elevations of her transaminases, however this is consistent with prior results.  Urinalysis shows no evidence for UTI.  CT scan obtained showing no evidence for renal calculus or other acute intra-abdominal process.  Patient given Tylenol and Zofran upon my evaluation, however she informed the nurse that this was not helping her pain.  [name] asked for Dilaudid, however I see no indication to give this medication.  She became angry with me and told me that I was "not doing my job as a doctor" by not giving her this medication.  Patient to be discharged to home with follow-up with her primary doctor if symptoms are not improving.  Cause of her abdominal pain unclear, however I suspect a degree of drug-seeking behavior.  Final Clinical Impression(s) / ED Diagnoses Final diagnoses:  None    Rx / DC Orders ED Discharge Orders     None         Veryl Speak, MD 03/30/22 [phone]

## 2022-03-30 NOTE — Discharge Instructions (Signed)
Take Tylenol 1000 mg every 6 hours as needed for pain.  Follow-up with your primary doctor if symptoms are not improving in the next few days.

## 2022-03-31 ENCOUNTER — Other Ambulatory Visit (HOSPITAL_COMMUNITY): Payer: Self-pay

## 2022-03-31 NOTE — Progress Notes (Signed)
Paramedicine Encounter    Patient ID: Jennifer [name], female    DOB: Mar 20, 1991, 32 y.o.   MRN: [id number]  Home visit today with pt.  She has had multiple ER/admissions since December-6 total.  Due to all those I have not been able to see her since 12/7.  Today physically she is feeling ok.  She denies c/p, no increased sob, no dizziness. No edema noted. She does still c/o pain to her flank/side area. She went to ER yesterday for same and nothing was found. Still having the side/flank pain.  She has not taken meds since 12/31 except her torsemide.  She has had a change in status-her husband has left and went to [place]. He was her care taker, walked the dogs, only income between them two. [name] struggling with managing her health and the home/finances. Her parents are coming in a few wks to stay for a few days.  I will send jenna message to call her to assist in getting those resources to her.  She wasn't quite up for talking too much about the situation. Eliezer Lofts will be back next week.  I encouraged her to get back on track with her meds as we need her well and to do the best she can.  She will need ride to appointment next week-she is too nervous to drive herself-husband was the one that took her to appointments.   Meds verified and pill box checked-there were several errors noted but those were fixed.   She was told in the ER that zofran is not good for her due to prolonged QT interval-she questioned about her compazine-I did ask pharmacy about that and it could cause same effect. Will address this with clinic visit next week.   BP (!) 150/100   Pulse (!) 108   Resp 18   Wt 145 lb (65.8 kg)   LMP  (LMP Unknown) Comment: no MP in over a year per pt  SpO2 98%   BMI 31.38 kg/m  Weight yesterday-145 Last visit weight-149  Patient Care Team: Elinor Parkinson as PCP - General (Physician Assistant) Jacelyn Pi, MD as PCP - Endocrinology (Endocrinology)  Patient Active Problem  List   Diagnosis Date Noted   Hypokalemia 03/23/2022   COVID 03/15/2022   COVID-19 virus infection 03/14/2022   Hyponatremia 03/14/2022   Positive D dimer 03/14/2022   Stage 3b chronic kidney disease (CKD) ([place]) 03/14/2022   Pulmonary hypertension ([place]) 02/06/2022   NASH (nonalcoholic steatohepatitis) 02/06/2022   Obesity (BMI 30-39.9) 02/06/2022   Heart failure ([place]) 12/03/2021   Acute on chronic systolic CHF (congestive heart failure) ([place]) 12/02/2021   Elevated troponin 12/02/2021   Pancytopenia ([place]) 12/02/2021   Amenorrhea 12/02/2021   Hyperosmolar hyperglycemic state (HHS) ([place]) 08/26/2021   Small intestinal bacterial overgrowth (SIBO) 08/26/2021   Lower extremity edema 08/26/2021   Hyperkalemia 08/14/2021   Hx of insulin dependent diabetes mellitus 08/14/2021   CKD (chronic kidney disease) stage 4, GFR 15-29 ml/min (HCC) 05/13/2021   Chest pain 03/10/2021   Acute on chronic combined systolic and diastolic CHF (congestive heart failure) ([place]) 02/09/2021   History of astrocytoma of brain 02/09/2021   Severe hyperglycemia due to diabetes mellitus ([place]) 01/25/2021   Diarrhea    Elevated transaminase level    Acute combined systolic and diastolic heart failure (HCC)    Nonischemic cardiomyopathy ([place])    Acute decompensated heart failure (HCC)    Elevated liver enzymes    Acute renal failure superimposed on  stage 3a chronic kidney disease ([place]) 11/26/2020   Abdominal pain 32/99/2426   Chronic systolic CHF (congestive heart failure) ([place]) 11/23/2020   Essential hypertension 11/23/2020   Hypertriglyceridemia 11/23/2020   Prolonged QT interval 11/23/2020   Type 2 diabetes mellitus with hyperlipidemia ([place]) 10/10/2018   Hypothyroidism 10/10/2018    Current Outpatient Medications:    albuterol (VENTOLIN HFA) 108 (90 Base) MCG/ACT inhaler, Inhale 1-2 puffs into the lungs every 6 (six) hours as needed for wheezing or shortness of breath., Disp: , Rfl:    aspirin EC 81 MG EC  tablet, Take 1 tablet (81 mg total) by mouth daily. Swallow whole., Disp: 30 tablet, Rfl: 1   CREON 36000-114000 units CPEP capsule, Take 36,000 Units by mouth 3 (three) times daily., Disp: , Rfl:    DULoxetine (CYMBALTA) 30 MG capsule, Take 30 mg by mouth at bedtime., Disp: , Rfl:    ergocalciferol (VITAMIN D2) 1.25 MG (50000 UT) capsule, Take 50,000 Units by mouth once a week., Disp: , Rfl:    Evolocumab (REPATHA) 140 MG/ML SOSY, Inject 140 mg into the skin See admin instructions. Inject 140 mg subcutaneously every other Sunday evening, Disp: , Rfl:    fenofibrate 160 MG tablet, Take 1 tablet (160 mg total) by mouth daily., Disp: 30 tablet, Rfl: 1   guaiFENesin (MUCINEX) 600 MG 12 hr tablet, Take 1 tablet (600 mg total) by mouth 2 (two) times daily., Disp: 30 tablet, Rfl: 0   guaiFENesin-dextromethorphan (ROBITUSSIN DM) 100-10 MG/5ML syrup, Take 10 mLs by mouth every 6 (six) hours as needed for cough., Disp: 118 mL, Rfl: 0   HUMULIN R U-500 KWIKPEN 500 UNIT/ML KwikPen, Inject 50-100 Units into the skin See admin instructions. 100 units at every meal and 50 units at bedtime, Disp: 100 mL, Rfl: 0   Insulin Pen Needle 32G X 4 MM MISC, 1 Device by Does not apply route QID. For use with insulin pens, Disp: 100 each, Rfl: 0   ivabradine (CORLANOR) 7.5 MG TABS tablet, Take 1 tablet (7.5 mg total) by mouth 2 (two) times daily with a meal., Disp: 60 tablet, Rfl: 6   levocetirizine (XYZAL) 5 MG tablet, Take 5 mg by mouth daily., Disp: , Rfl:    levothyroxine (SYNTHROID) 125 MCG tablet, Take 1 tablet (125 mcg total) by mouth daily at 6 (six) AM., Disp: 30 tablet, Rfl: 1   norethindrone-ethinyl estradiol-FE ([name] 1/20) 1-20 MG-MCG tablet, Take 1 tablet by mouth daily., Disp: , Rfl:    pantoprazole (PROTONIX) 40 MG tablet, Take 40 mg by mouth daily., Disp: , Rfl:    pregabalin (LYRICA) 150 MG capsule, Take 1 capsule (150 mg total) by mouth at bedtime. (Patient taking differently: Take 300 mg by mouth 2  (two) times daily.), Disp: , Rfl:    prochlorperazine (COMPAZINE) 5 MG tablet, Take 5 mg by mouth daily., Disp: , Rfl:    rosuvastatin (CRESTOR) 40 MG tablet, Take 40 mg by mouth daily., Disp: , Rfl:    sildenafil (REVATIO) 20 MG tablet, Take 1 tablet (20 mg total) by mouth 3 (three) times daily., Disp: 90 tablet, Rfl: 3   sodium bicarbonate 650 MG tablet, Take 650 mg by mouth 2 (two) times daily., Disp: , Rfl:    torsemide 40 MG TABS, Take 40 mg by mouth daily., Disp: 30 tablet, Rfl: 1   tretinoin (RETIN-A) 0.05 % cream, Apply 1 application topically at bedtime., Disp: , Rfl:    carvedilol (COREG) 3.125 MG tablet, Take 1 tablet (3.125  mg total) by mouth 2 (two) times daily with a meal., Disp: 60 tablet, Rfl: 0 Allergies  Allergen Reactions   Ketamine Other (See Comments)    In vegetative state for 15 minutes per pt   Erythromycin     unk   Fentanyl Nausea And Vomiting   Maitake Mushroom [[place]] Itching    Itchy throat   Penicillin V Itching    Gi upset   Shellfish Allergy Other (See Comments)    Pt has never had shellfish but tested positive on allergy test. Pt states contrast in CT is okay   Morphine Itching and Rash   Penicillins Rash    Has patient had a PCN reaction causing immediate rash, facial/tongue/throat swelling, SOB or lightheadedness with hypotension: Y Has patient had a PCN reaction causing severe rash involving mucus membranes or skin necrosis: Y Has patient had a PCN reaction that required hospitalization: N Has patient had a PCN reaction occurring within the last 10 years: Y If all of the above answers are "NO", then may proceed with Cephalosporin use.    Prednisone Rash      Social History   Socioeconomic History   Marital status: Married    Spouse name: Not on file   Number of children: 0   Years of education: Not on file   Highest education level: Not on file  Occupational History   Occupation: [employer]  Tobacco Use   Smoking status: Never    Smokeless tobacco: Never  Vaping Use   Vaping Use: Never used  Substance and Sexual Activity   Alcohol use: Never   Drug use: Never   Sexual activity: Yes  Other Topics Concern   Not on file  Social History Narrative   Not on file   Social Determinants of Health   Financial Resource Strain: Low Risk  (12/10/2021)   Overall Financial Resource Strain (CARDIA)    Difficulty of Paying Living Expenses: Not hard at all  Food Insecurity: No Food Insecurity (03/14/2022)   Hunger Vital Sign    Worried About Running Out of Food in the Last Year: Never true    [place] in the Last Year: Never true  Transportation Needs: No Transportation Needs (03/14/2022)   PRAPARE - Hydrologist (Medical): No    Lack of Transportation (Non-Medical): No  Physical Activity: Sufficiently Active (12/10/2021)   Exercise Vital Sign    Days of Exercise per Week: 3 days    Minutes of Exercise per Session: 60 min  Stress: No Stress Concern Present (12/10/2021)   [place]    Feeling of Stress : Not at all  Social Connections: [place] (12/10/2021)   Social Connection and Isolation Panel [NHANES]    Frequency of Communication with Friends and Family: More than three times a week    Frequency of Social Gatherings with Friends and Family: More than three times a week    Attends Religious Services: More than 4 times per year    Active Member of [employer] or Organizations: No    Attends Music therapist: More than 4 times per year    Marital Status: Married  Human resources officer Violence: Not At Risk (03/14/2022)   Humiliation, Afraid, Rape, and Kick questionnaire    Fear of Current or Ex-Partner: No    Emotionally Abused: No    Physically Abused: No    Sexually Abused: No    Physical  Exam      Future Appointments  Date Time Provider [place]  04/05/2022  9:30 AM [facility] PA/NP  [facility] None  04/28/2022 10:00 AM [facility] PA/NP [facility] None       [name], Paramedic-Paramedic [phone] [facility] Paramedic  03/31/22

## 2022-04-01 ENCOUNTER — Telehealth: Payer: Self-pay | Admitting: Licensed Clinical Social Worker

## 2022-04-01 NOTE — Telephone Encounter (Signed)
H&V Care Navigation CSW Progress Note  Clinical Social Worker contacted patient by phone to f/u on request for ride to appt and LCSW support given social challenges that recently arose. Request was for Jennifer [name], to reach out to pt when she returns from being out of office on Tuesday but noted that appt for pt is Tuesday morning therefore will need a ride arranged by this Probation officer. No answer at [phone]. Left voicemail requesting call back.   Pt returned my call this afternoon. Introduced self, role, reason for call. Explained Eliezer Lofts would be back in clinic on Tuesday morning. Pt agreeable to ride being arranged through [place], I will do so and reviewed verbally the transportation agreement with pt (see below). No additional questions from pt at this time- encouraged her to reach out to me Monday if needed, pt states she is doing okay at this time.   Patient is participating in a Managed Medicaid Plan:  No, BCBS commercial only.   SDOH Screenings   Food Insecurity: No Food Insecurity (03/14/2022)  Housing: Low Risk  (03/14/2022)  Transportation Needs: No Transportation Needs (03/14/2022)  Utilities: Not At Risk (03/14/2022)  Alcohol Screen: Low Risk  (12/10/2021)  Depression (PHQ2-9): Low Risk  (12/10/2021)  Financial Resource Strain: Low Risk  (12/10/2021)  Physical Activity: Sufficiently Active (12/10/2021)  Social Connections: Socially Integrated (12/10/2021)  Stress: No Stress Concern Present (12/10/2021)  Tobacco Use: Low Risk  (03/29/2022)     04/01/2022  Jennifer [name] DOB: 1990/10/23 MRN: [id number]   RIDER WAIVER AND RELEASE OF LIABILITY  For the purposes of helping with transportation needs, [facility] partners with outside transportation providers (taxi companies, [place], Social research officer, government.) to give [employer] patients or other approved people the choice of on-demand rides [employer]") to our buildings for non-emergency visits.  By using [employer], I, the person signing  this document, on behalf of myself and/or any legal minors (in my care using the [employer]), agree:  Government social research officer given to me are supplied by independent, outside transportation providers who do not work for, or have any affiliation with, [employer]. [place] is not a transportation company. [place] has no control over the quality or safety of the rides I get using [employer]. [place] has no control over whether any outside ride will happen on time or not. [facility] gives no guarantee on the reliability, quality, safety, or availability on any rides, or that no mistakes will happen. I know and accept that traveling by vehicle (car, truck, SVU, Lucianne Lei, bus, taxi, etc.) has risks of serious injuries such as disability, being paralyzed, and death. I know and agree the risk of using [employer] is mine alone, and not [employer]. Transport Services are provided "as is" and as are available. The transportation providers are in charge for all inspections and care of the vehicles used to provide these rides. I agree not to take legal action against [place], its agents, employees, officers, directors, representatives, insurers, attorneys, assigns, successors, subsidiaries, and affiliates at any time for any reasons related directly or indirectly to using [employer]. I also agree not to take legal action against [place] or its affiliates for any injury, death, or damage to property caused by or related to using [employer]. I have read this Waiver and Release of Liability, and I understand the terms used in it and their legal meaning. This Waiver is freely and voluntarily given with the understanding that my right (or any legal minors)  to legal action against [place] relating to [employer] is knowingly given up to use these services.   I attest that I read the Ride Waiver and Release of Liability to Jennifer [name], gave Ms.  Walkins the opportunity to ask questions and answered the questions asked (if any). I affirm that Jennifer [name] then provided consent for assistance with transportation.     [place], MSW, [place]  [phone]- work cell phone (preferred) [phone]- desk phone

## 2022-04-01 NOTE — Telephone Encounter (Signed)
H&V Care Navigation CSW Progress Note  Clinical Social Worker contacted patient by phone to f/u on noted appt on Tuesday. Request was made for Jeri Cos, to reach out to her regarding social challenges, but currently pt has an appt Tuesday morning and Eliezer Lofts will not be back in the office until that time. LCSW has attempted pt at [phone], left voicemail requesting call back as pt is able. Will reattempt again Monday if no call back today.   Patient is participating in a Managed Medicaid Plan:  No, BCBS commercial only.   SDOH Screenings   Food Insecurity: No Food Insecurity (03/14/2022)  Housing: Low Risk  (03/14/2022)  Transportation Needs: No Transportation Needs (03/14/2022)  Utilities: Not At Risk (03/14/2022)  Alcohol Screen: Low Risk  (12/10/2021)  Depression (PHQ2-9): Low Risk  (12/10/2021)  Financial Resource Strain: Low Risk  (12/10/2021)  Physical Activity: Sufficiently Active (12/10/2021)  Social Connections: Socially Integrated (12/10/2021)  Stress: No Stress Concern Present (12/10/2021)  Tobacco Use: Low Risk  (03/29/2022)   Westley Hummer, MSW, LCSW Clinical Social Worker [place]  [phone]- work cell phone (preferred) [phone]- desk phone

## 2022-04-04 ENCOUNTER — Telehealth: Payer: Self-pay | Admitting: Licensed Clinical Social Worker

## 2022-04-04 NOTE — Telephone Encounter (Signed)
H&V Care Navigation CSW Progress Note  Clinical Social Worker  received call from pt  to clarify ride pick up time for tomorrow, 9am. She also inquires if we can recommend a lawyer for assistance w/ rent and her divorce. LCSW shared that we cannot recommend a specific law firm, I did provide her with the attorney hotline at [facility] to determine next steps for those questions [phone]). I let her know Eliezer Lofts will be back in clinic tomorrow, we discussed potential assistance if eligible through Patient Care Fund as pt determines next steps. Pt also inquires about rides to other appts, at this time we are only able to arrange rides for pt through transportation services to [facility] appointments, shared there are community organizations she may be able to utilize for rides such as [place], will request Eliezer Lofts provide her this sheet tomorrow during appt as well.  Pt appreciative of update.   Patient is participating in a Managed Medicaid Plan:  No, BCBS commercial plan  [place]: No Food Insecurity (03/14/2022)  Housing: Low Risk  (03/14/2022)  Transportation Needs: No Transportation Needs (03/14/2022)  Utilities: Not At Risk (03/14/2022)  Alcohol Screen: Low Risk  (12/10/2021)  Depression (PHQ2-9): Low Risk  (12/10/2021)  Financial Resource Strain: Low Risk  (12/10/2021)  Physical Activity: Sufficiently Active (12/10/2021)  Social Connections: Socially Integrated (12/10/2021)  Stress: No Stress Concern Present (12/10/2021)  Tobacco Use: Low Risk  (03/29/2022)    Westley Hummer, MSW, LCSW Clinical Social Worker [place]  [phone]- work cell phone (preferred) [phone]- desk phone

## 2022-04-04 NOTE — Progress Notes (Signed)
Advanced Heart Failure Clinic Note   PCP: Sue Lush, PA-C HF Cardiologist: Dr. 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   Has been followed by Dr. Einar Gip for her HF.    Admitted 11/22/20 with ab pain and diarrhea. Ab CT concerning for vasculitis (no pancreatitis). Her TG were found to be 4030. She was started on an insulin/dextrose infusion. Cardiology and Nephrology were consulted after her creatinine and LFTs continued to rise and she began developing hypotension. Echo 11/28/20 EF was found to be 20-25% with moderate RV dysfunction and mild septal flattening. Started on DBA 2.5. AHF consulted for further management. PICC placed to follow CVP and Co-Ox. BP were high and milrinone added to augment diuresis. cMRI c/w prior myocarditis vs sarcoid (see below). Mod pericardial effusion also noted. LVEF 29%. RV normal. She underwent R/LHC showing single vessel RCA occlusion, RA 1, LVEDP 17, Fick CO 5.6/CI 3.88. She was able to be weaned off inotropes and GDMT titrated.     Post hospital follow up, she was drinking >2L/day fluid and eating more salty foods, volume was up, ReDs 43%, instructed to increase lasix to 20 mg bid x 3 days. She followed up with [name] with [facility] 12/17/20 for a 2nd opinion. He placed LifeVest and switched beta blocker to Toprol XL.   Called after hours service with swelling, instructed to continue lasix 20 mg bid.   Seen in ED 12/25/20 for fall. She was standing at work and became dizzy. She fell backwards and hit her head. CT neg, labs showed K 5.7. She was given 1 dose of Lokelma. She denied palpitations.   Volume overloaded at follow up 01/06/21, lasix switched to torsemide. She had a LifeVest per Cardiology at [facility].   Echo (10/22): EF  up to 45-50%, grade II DD, RV ok. LifeVest off.   Treated in ED 10/22 for pancreatitis, then admitted 11/22 for hyperglycemia, did not meet criteria for DKA. Hospitalization c/b hyponatremia, sodium 118. Concern for volume overload. Lasix restarted but SCr increased. Nephrology consulted and restarted gentle IVF and GDMT held for a couple of days. Recommendation for Tolvaptan, however patient declined and requested discharge with outpatient follow-up her MD at [facility]. Jardiance stopped at discharge, weight 129 lbs.    Seen back in ED a few days later with increased weight and abd distention. Given IV lasix in ED and torsemide increased to 40 bid.   Follow up 11/22, her breathing had improved but c/o abdominal swelling/bloating, not felt to be related to volume overload. Concern for possible gastroparesis. She was referred to GI. Abd CT was unremarkable. Ceruloplasmin level was also low, raising concern for possible Wilson's Disease. However had further w/u w/ [facility] and reports Wilson's disease was ruled out. Additional GI testing unremarkable.    Echo 12/22 EF 45%, RV normal.    Follow up 08/02/21, weight up 16 lbs, ReDs 27%. Arranged for RHC to better assess hemodynamics and see if abdominal symptoms related to right-sided HF due to high-output state or non-cardiac (see results below).    [place] 5/23 with elevated filling pressures and normal cardiac ouput. Mild to moderate mixed pulmonary hypertension with prominent v-waves in RA, suggestive of significant TR. Given IV lasix and metolazone 2.5 added weekly to diuretic regimen.    RA = 12  prominent V waves RV = 47/14 PA = 48/25 (37) PCW = 25 Fick cardiac output/index = 4.4/2.8 PVR =3.7 WU Ao sat = 99% PA sat = 62%, 62%   Post cath follow up, volume up, REDs 43%. Torsemide increased and weekly metolazone added.   Admitted 5/23 with AKI and hyperkalemia. Given IVF, insulin, calcium gluconate and Lokelma. Echo showed EF 45-50%, RV normal, mild-mod  MR, RVSP 30. Only mild TR. Restarted on torsemide 40 bid (lower dose) and Entresto 49/51 bid. Arranged paramedicine, discharged home 143 lbs.   Follow up 6/23, several med discrepancies. Labs showed serum glucose 531, AKI and hyperkalemia and advised eval in ED. Admitted, GDMT held and hydrated with IVF. Discharged home on previous regimen on GDMT, weight 148 lbs.  Follow up 09/24/21, felt poorly and fluid overloaded. Treated w/ Furoscix 80 mg daily x 4 days, w/ instruction to switch back to torsemide 40 mg bid thereafter and to return in 2 wks for f/u.   Seen 09/27/21 at [facility] for 2nd opinion, felt to be congested and recommended increasing torsemide to 60 mg bid and instructed f/u in 3 months.   Follow up 7/23, continued with bloating. Med discrepancies with her torsemide. Labs at visit c/w dehydration. Scr elevted 3.61 (up from 2.24), BUN 79. BNP 7.9. Entresto held until repeat labs.  Received Furoscix 8/4, 8/5, and 10/31/21.  Admitted 9/23 for volume overload in setting of RV failure and AKI. Required milrinone. Underwent Cardiomems implant  RHC 9/23  RA 15 PA 37/23 (28) PCWP 16 Fick CO/CI 4.2/2.6 Thermo CO/CI 3.7/2.3 PVR 4.3 PA sat 51%, 54% PAPi 0.93  Today she returns for HF follow up with her husband. [name] from [employer] here as well. Blood sugars very high 300-600 range. Trying to watch diet. Cardiomems has been ok until today typically 15-19 (goal 17). Today 26. Says she is drinking a lot of fluids. Taking torsemide 80 mg daily. + ab bloating. + DOE. Had CP yesterday. Typically gets a few times per week. Lasts about 15 mins. Unrelated to exertion. Follows with Dr. Hulen Skains at [facility]. Had renal biopsy recently (12/31/21) and showed DM and HTN nephropathy. They discussed starting HD soon.    ROS: All systems reviewed and negative except as per HPI.   Past Medical History:  Diagnosis Date   Brain tumor ([place]) 03/29/1995   astrocytoma   CHF (congestive heart failure) (HCC)     Cholesterosis    DM (diabetes mellitus) ([place]) 10/10/2018   Fatty liver    HTN (hypertension) 10/10/2018   Hypothyroidism 10/10/2018   Lipoprotein deficiency    Lung disease    longevity long term   Pancreatitis    Polycystic ovary syndrome    Current Outpatient Medications  Medication Sig Dispense Refill   albuterol (VENTOLIN HFA) 108 (90 Base) MCG/ACT inhaler Inhale 1-2 puffs into the lungs every 6 (six) hours as needed for wheezing or shortness of breath.     aspirin EC 81 MG EC tablet Take 1 tablet (81 mg total) by mouth daily. Swallow whole. 30 tablet 1   carvedilol (COREG) 3.125 MG tablet Take 1 tablet (3.125 mg total) by mouth 2 (two) times daily with a meal. 60 tablet 0   CREON 36000-114000 units CPEP capsule Take 36,000 Units by mouth 3 (three) times daily.     DULoxetine (CYMBALTA) 30 MG capsule Take 30 mg by mouth at bedtime.     ergocalciferol (VITAMIN D2) 1.25 MG (50000 UT) capsule Take 50,000 Units by mouth once  a week.     Evolocumab (REPATHA) 140 MG/ML SOSY Inject 140 mg into the skin See admin instructions. Inject 140 mg subcutaneously every other Sunday evening     fenofibrate 160 MG tablet Take 1 tablet (160 mg total) by mouth daily. 30 tablet 1   guaiFENesin (MUCINEX) 600 MG 12 hr tablet Take 1 tablet (600 mg total) by mouth 2 (two) times daily. 30 tablet 0   guaiFENesin-dextromethorphan (ROBITUSSIN DM) 100-10 MG/5ML syrup Take 10 mLs by mouth every 6 (six) hours as needed for cough. 118 mL 0   HUMULIN R U-500 KWIKPEN 500 UNIT/ML KwikPen Inject 50-100 Units into the skin See admin instructions. 100 units at every meal and 50 units at bedtime 100 mL 0   Insulin Pen Needle 32G X 4 MM MISC 1 Device by Does not apply route QID. For use with insulin pens 100 each 0   ivabradine (CORLANOR) 7.5 MG TABS tablet Take 1 tablet (7.5 mg total) by mouth 2 (two) times daily with a meal. 60 tablet 6   levocetirizine (XYZAL) 5 MG tablet Take 5 mg by mouth daily.     levothyroxine  (SYNTHROID) 125 MCG tablet Take 1 tablet (125 mcg total) by mouth daily at 6 (six) AM. 30 tablet 1   norethindrone-ethinyl estradiol-FE ([name] 1/20) 1-20 MG-MCG tablet Take 1 tablet by mouth daily.     pantoprazole (PROTONIX) 40 MG tablet Take 40 mg by mouth daily.     pregabalin (LYRICA) 150 MG capsule Take 1 capsule (150 mg total) by mouth at bedtime. (Patient taking differently: Take 300 mg by mouth 2 (two) times daily.)     prochlorperazine (COMPAZINE) 5 MG tablet Take 5 mg by mouth daily.     rosuvastatin (CRESTOR) 40 MG tablet Take 40 mg by mouth daily.     sildenafil (REVATIO) 20 MG tablet Take 1 tablet (20 mg total) by mouth 3 (three) times daily. 90 tablet 3   sodium bicarbonate 650 MG tablet Take 650 mg by mouth 2 (two) times daily.     torsemide 40 MG TABS Take 40 mg by mouth daily. 30 tablet 1   tretinoin (RETIN-A) 0.05 % cream Apply 1 application topically at bedtime.     No current facility-administered medications for this visit.   Allergies  Allergen Reactions   Ketamine Other (See Comments)    In vegetative state for 15 minutes per pt   Erythromycin     unk   Fentanyl Nausea And Vomiting   Maitake Mushroom [M[place]] Itching    Itchy throat   Penicillin V Itching    Gi upset   Shellfish Allergy Other (See Comments)    Pt has never had shellfish but tested positive on allergy test. Pt states contrast in CT is okay   Morphine Itching and Rash   Penicillins Rash    Has patient had a PCN reaction causing immediate rash, facial/tongue/throat swelling, SOB or lightheadedness with hypotension: Y Has patient had a PCN reaction causing severe rash involving mucus membranes or skin necrosis: Y Has patient had a PCN reaction that required hospitalization: N Has patient had a PCN reaction occurring within the last 10 years: Y If all of the above answers are "NO", then may proceed with Cephalosporin use.    Prednisone Rash   Social History   Socioeconomic History    Marital status: Married    Spouse name: Not on file   Number of children: 0   Years of education: Not on  file   Highest education level: Not on file  Occupational History   Occupation: [employer]  Tobacco Use   Smoking status: Never   Smokeless tobacco: Never  Vaping Use   Vaping Use: Never used  Substance and Sexual Activity   Alcohol use: Never   Drug use: Never   Sexual activity: Yes  Other Topics Concern   Not on file  Social History Narrative   Not on file   Social Determinants of Health   Financial Resource Strain: Low Risk  (12/10/2021)   Overall Financial Resource Strain (CARDIA)    Difficulty of Paying Living Expenses: Not hard at all  Food Insecurity: No Food Insecurity (03/14/2022)   Hunger Vital Sign    Worried About Running Out of Food in the Last Year: Never true    Ran Out of Food in the Last Year: Never true  Transportation Needs: No Transportation Needs (03/14/2022)   PRAPARE - Hydrologist (Medical): No    Lack of Transportation (Non-Medical): No  Physical Activity: Sufficiently Active (12/10/2021)   Exercise Vital Sign    Days of Exercise per Week: 3 days    Minutes of Exercise per Session: 60 min  Stress: No Stress Concern Present (12/10/2021)   Sun    Feeling of Stress : Not at all  Social Connections: [place] (12/10/2021)   Social Connection and Isolation Panel [NHANES]    Frequency of Communication with Friends and Family: More than three times a week    Frequency of Social Gatherings with Friends and Family: More than three times a week    Attends Religious Services: More than 4 times per year    Active Member of [employer] or Organizations: No    Attends Music therapist: More than 4 times per year    Marital Status: Married  Human resources officer Violence: Not At Risk (03/14/2022)   Humiliation, Afraid, Rape, and Kick  questionnaire    Fear of Current or Ex-Partner: No    Emotionally Abused: No    Physically Abused: No    Sexually Abused: No   Family History  Problem Relation Age of Onset   Diabetes Mother    Hypertension Mother    Hyperlipidemia Mother    Thyroid disease Mother    Hypertension Father    Diabetes Father    Pancreatic cancer Paternal Aunt    Pancreatic cancer Paternal Uncle    Colon cancer Neg Hx    Esophageal cancer Neg Hx    Stomach cancer Neg Hx    LMP  (LMP Unknown) Comment: no MP in over a year per pt  Wt Readings from Last 3 Encounters:  03/31/22 65.8 kg (145 lb)  03/26/22 65.5 kg (144 lb 6.4 oz)  03/25/22 65.5 kg (144 lb 6.4 oz)   PHYSICAL EXAM: General:  Walked into clinic No resp difficulty HEENT: normal + R eye ptosis  + hirsutism Neck: supple. no JVD. Carotids 2+ bilat; no bruits. No lymphadenopathy or thryomegaly appreciated. Cor: PMI nondisplaced. Regular rate & rhythm. No rubs, gallops or murmurs. Lungs: clear Abdomen: obese soft, nontender, + distended. No hepatosplenomegaly. No bruits or masses. Good bowel sounds. Extremities: no cyanosis, clubbing, rash, edema Neuro: alert & orientedx3, cranial nerves grossly intact. moves all 4 extremities w/o difficulty. Affect pleasant  ReDs: 45%-->42% today  ASSESSMENT & PLAN: Acute on Chronic Systolic Heart Failure - likely NICM. - Echo (1/22): EF 35-40% Mod  MR - Echo (11/28/20): EF 20-25% with moderate RV dysfunction and mild septal flattening. Mod-severe TR - Rheum serologies negative, but Rheumatoid factor mildly elevated at 15.5 - cMRI (9/22): EF 29%c/w prior myocarditis vs sarcoid. May have delayed chemo cardiotoxcity.  - R/LHC with single vessel RCA occlusion, RA 1, LVEDP 17, Fick CO 5.6/CI 3.88. - Echo (10/22): EF 45-50%, grade II DD, RV ok. - RHC (5/23) with elevated filling pressures and normal CO, RA 12 (with prominent v-waves), RV 47/14, PA 48/25 (37), PCW 25 - Echo (5/23): EF 45-50%, RV normal  - Echo  (9/23): EF 50-55%, RV okay, mild MR, mild to moderate TR - S/p cardiomems placement 12/06/21 - RHC 12/06/21: RA 15, PA 37/23 (28), PCWP 16, Fick CO/CI 4.2/2.6, Thermo CO/CI 3.7/2.3, PVR 4.3, PA sat 51%, 54%, PAPi 0.93 - VQ negative - Chronically NYHA Class II-III  - Volume up today. On torsemide 80 daily. Cardiomems 15-19 typically. Today 26 in setting of high fluid intake with poorly controlled DM2 - Continue Entresto 24-26 mg bid.  - Continue Coreg 3.125 mg bid. - Continue Corlanor 7.5 mg bid.  - Off Jardiance due to frequent GU infections.  - Off spiro w/ hyperkalemia  - Continue Paramedince.  - Will have her take extra torsemide 80 at night on days when volume up.  - Continue Cardiomems monitoring - Volume management complicated by progressive renal dysfunction. Working with Dr. Hulen Skains (Nephrology at [facility]). Discussing timing of HD start.   2. CKD IV - Baseline Scr ~2.5 - Working with Dr. Hulen Skains (Nephrology at [facility]). Had renal biopsy recently (12/31/21) and showed DM and HTN nephropathy. Discussing timing of HD start.   3. CAD - Single vessel RCA occlusion (small co-dominant vessel) on cardiac cath 09/08. - Medical management.  - Having occasional CP that does not sound anginal  - Continue aspirin + statin.   4. Pulmonary hypertension with cor pulmonale - RHC 12/06/21: RA 15, PA 37/23 (28), PCWP 16, Fick CO/CI 4.2/2.6, Thermo CO/CI 3.7/2.3, PVR 4.3, PA sat 51%, 54%, PAPi 0.93 - VQ negative   5. Severe hyperTG - Due to LPL deficiency w/ recent pancreatitis. - Continue fenofibrate + niacin  - Previously offered referral to [facility] for further management. She prefers to continue with her endocrinologist at [facility] who currently manages/follows.   6. Type 3c DM - Follows w/ endocrinology at [facility]. CBGs currently out of control. Stressed need to f/u with Endo team  - 2/2 chronic pancreatitis   7. Abdominal swelling/bloating - Seen at [facility] (11/20) for IBS, per chart review. -  Had penumatosis intestinalis. She was started on pancreatic enzyme replacement. - EGD (10/21): normal, bx taken to assess for celiac. - CT abdomen (2/22) and RUQ US showed no obvious gallbladder stones. - HIDA (4/22): ended early due to tachypnea, but cystic duct and gallbladder appeared WNL. - CT abdomen 12/22 unremarkable  - suspect primarily fatty liver - Abdominal u/s as above.  8. Poor Med Compliance - Continue paramedicine, appreciate their assistance  Rafael Bihari, FNP 04/04/22

## 2022-04-05 ENCOUNTER — Ambulatory Visit (HOSPITAL_COMMUNITY)
Admit: 2022-04-05 | Discharge: 2022-04-05 | Disposition: A | Discharge status: Home / Self Care | Payer: Medicaid Other | Attending: Family Medicine | Admitting: Family Medicine

## 2022-04-05 ENCOUNTER — Other Ambulatory Visit (HOSPITAL_COMMUNITY): Payer: Self-pay

## 2022-04-05 ENCOUNTER — Encounter (HOSPITAL_COMMUNITY): Payer: Self-pay

## 2022-04-05 VITALS — BP 118/78 | HR 78 | Wt 144.4 lb

## 2022-04-05 DIAGNOSIS — K861 Other chronic pancreatitis: Secondary | ICD-10-CM | POA: Insufficient documentation

## 2022-04-05 DIAGNOSIS — K760 Fatty (change of) liver, not elsewhere classified: Secondary | ICD-10-CM | POA: Insufficient documentation

## 2022-04-05 DIAGNOSIS — I071 Rheumatic tricuspid insufficiency: Secondary | ICD-10-CM | POA: Diagnosis not present

## 2022-04-05 DIAGNOSIS — I130 Hypertensive heart and chronic kidney disease with heart failure and stage 1 through stage 4 chronic kidney disease, or unspecified chronic kidney disease: Secondary | ICD-10-CM | POA: Diagnosis not present

## 2022-04-05 DIAGNOSIS — I3139 Other pericardial effusion (noninflammatory): Secondary | ICD-10-CM | POA: Insufficient documentation

## 2022-04-05 DIAGNOSIS — R140 Abdominal distension (gaseous): Secondary | ICD-10-CM

## 2022-04-05 DIAGNOSIS — I5032 Chronic diastolic (congestive) heart failure: Secondary | ICD-10-CM | POA: Diagnosis not present

## 2022-04-05 DIAGNOSIS — Z8719 Personal history of other diseases of the digestive system: Secondary | ICD-10-CM | POA: Insufficient documentation

## 2022-04-05 DIAGNOSIS — Z79899 Other long term (current) drug therapy: Secondary | ICD-10-CM | POA: Insufficient documentation

## 2022-04-05 DIAGNOSIS — I5022 Chronic systolic (congestive) heart failure: Secondary | ICD-10-CM | POA: Insufficient documentation

## 2022-04-05 DIAGNOSIS — I2510 Atherosclerotic heart disease of native coronary artery without angina pectoris: Secondary | ICD-10-CM | POA: Insufficient documentation

## 2022-04-05 DIAGNOSIS — E781 Pure hyperglyceridemia: Secondary | ICD-10-CM | POA: Diagnosis not present

## 2022-04-05 DIAGNOSIS — I272 Pulmonary hypertension, unspecified: Secondary | ICD-10-CM | POA: Insufficient documentation

## 2022-04-05 DIAGNOSIS — N184 Chronic kidney disease, stage 4 (severe): Secondary | ICD-10-CM | POA: Insufficient documentation

## 2022-04-05 DIAGNOSIS — E282 Polycystic ovarian syndrome: Secondary | ICD-10-CM | POA: Diagnosis not present

## 2022-04-05 DIAGNOSIS — Z139 Encounter for screening, unspecified: Secondary | ICD-10-CM

## 2022-04-05 DIAGNOSIS — E109 Type 1 diabetes mellitus without complications: Secondary | ICD-10-CM | POA: Diagnosis not present

## 2022-04-05 DIAGNOSIS — Z794 Long term (current) use of insulin: Secondary | ICD-10-CM | POA: Diagnosis not present

## 2022-04-05 DIAGNOSIS — E1122 Type 2 diabetes mellitus with diabetic chronic kidney disease: Secondary | ICD-10-CM | POA: Diagnosis not present

## 2022-04-05 DIAGNOSIS — Z8616 Personal history of COVID-19: Secondary | ICD-10-CM | POA: Insufficient documentation

## 2022-04-05 LAB — BASIC METABOLIC PANEL
Anion gap: 11 (ref 5–15)
BUN: 35 mg/dL — ABNORMAL HIGH (ref 6–20)
CO2: 22 mmol/L (ref 22–32)
Calcium: 8.9 mg/dL (ref 8.9–10.3)
Chloride: 101 mmol/L (ref 98–111)
Creatinine, Ser: 1.66 mg/dL — ABNORMAL HIGH (ref 0.44–1.00)
GFR, Estimated: 42 mL/min — ABNORMAL LOW (ref >60)
Glucose, Bld: 134 mg/dL — ABNORMAL HIGH (ref 70–99)
Potassium: 3.9 mmol/L (ref 3.5–5.1)
Sodium: 134 mmol/L — ABNORMAL LOW (ref 135–145)

## 2022-04-05 LAB — BRAIN NATRIURETIC PEPTIDE: B Natriuretic Peptide: 36.9 pg/mL (ref 0.0–100.0)
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
## 2022-04-05 NOTE — Addendum Note (Signed)
Encounter addended by: Jorge Ny, LCSW on: 04/05/2022 11:29 AM  Actions taken: Flowsheet accepted, Clinical Note Signed

## 2022-04-05 NOTE — Progress Notes (Signed)
Paramedicine Encounter   Patient ID: Jennifer [name] , female,   DOB: May 26, 1990,31 y.o.,  MRN: [id number]  Pt here by [facility] tx[facility].  Her cardiomems is at goal.  She has not been eating much-still depressed by the separation from husband.  Her parents are coming in to town this wknd to help her. They are helping her with finances right now.  Would like for her to stay here in [place] since her docs are here.  Eats when her blood sugar is dropping.  Poor appetite.  Her fluid intake looks good.   She has appoint on Monday for dialysis education and will go from there for the plan.   She just got [name] and will need to get the number to medicaid to set up txp.  She has [employer] medicaid.   Eliezer Lofts gave her the phone number to call to sch txps -she was denied disability-she did file the appeal  -she will call case worker to let them know status change for food stamps.  -[facility] and she has [facility] there -[facility] [place] can be outreach for her too  -weight at home 138    Will call at end of feb to sch 3 month f/u.    Met patient in clinic today with provider.  Weight @ clinic-144  B/P-118/78 P-78 [id number]  Med changes-none  EKG done today-   [name], EMT-Paramedic [phone] 04/05/2022

## 2022-04-05 NOTE — Patient Instructions (Signed)
It was great to see you today! No medication changes are needed at this time.   Labs today We will only contact you if something comes back abnormal or we need to make some changes. Otherwise no news is good news!  Your physician recommends that you schedule a follow-up appointment in: 2 months with Dr Haroldine Laws  Do the following things EVERYDAY: Weigh yourself in the morning before breakfast. Write it down and keep it in a log. Take your medicines as prescribed Eat low salt foods--Limit salt (sodium) to 2000 mg per day.  Stay as active as you can everyday Limit all fluids for the day to less than 2 liters  At the [facility], you and your health needs are our priority. As part of our continuing mission to provide you with exceptional heart care, we have created designated Provider Care Teams. These Care Teams include your primary Cardiologist (physician) and Advanced Practice Providers (APPs- Physician Assistants and Nurse Practitioners) who all work together to provide you with the care you need, when you need it.   You may see any of the following providers on your designated Care Team at your next follow up: Dr Glori Bickers Dr Loralie Champagne Dr. Roxana Hires, NP Lyda Jester, [place] [facility] [place], [place] Forestine Na, NP Audry Riles, PharmD   Please be sure to bring in all your medications bottles to every appointment.

## 2022-04-11 ENCOUNTER — Other Ambulatory Visit (HOSPITAL_COMMUNITY): Payer: Self-pay

## 2022-04-11 NOTE — Progress Notes (Signed)
Paramedicine Encounter    Patient ID: Jennifer [name], female    DOB: 25-Aug-1990, 32 y.o.   MRN: [id number]  Pt reports she is feeling a little overwhelmed with the changes that are happening lately- she just got off phone with dialysis center educator and got a lot of information.  She also got a lot of information about legal things when she talked to resource center about her options.   She reports she is feeling good.  Yesterday she had nausea and heart burn and stomach pains.    Getting her [facility]s moved [facility]-will get her list of meds sent over and then he will pull from [facility].   The meds she needs now--relayed this to [place] transmitters Sildenafil  Prochlorperazine    Meds verified and pill box checked.   Her parents had some questions about getting her help since she is now living alone,  I told them to call 911 if they get alerts her CBG are dropping and their 911 team will transfer it to here and help will be sent. I also mentioned to get a lock box for the door with spare key for first responders and they are going to do that and let me know code to send in to our 911 center for that.    BP 136/72   Pulse 90   Resp 18   Wt 138 lb (62.6 kg)   LMP  (LMP Unknown) Comment: no MP in over a year per pt  SpO2 98%   BMI 29.86 kg/m  Weight yesterday-138  Last visit weight-144 @ clinic   Patient Care Team: Elinor Parkinson as PCP - General (Physician Assistant) Jacelyn Pi, MD as PCP - Endocrinology (Endocrinology)  Patient Active Problem List   Diagnosis Date Noted   Hypokalemia 03/23/2022   COVID 03/15/2022   COVID-19 virus infection 03/14/2022   Hyponatremia 03/14/2022   Positive D dimer 03/14/2022   Stage 3b chronic kidney disease (CKD) ([place]) 03/14/2022   Pulmonary hypertension ([place]) 02/06/2022   NASH (nonalcoholic steatohepatitis) 02/06/2022   Obesity (BMI 30-39.9) 02/06/2022   Heart failure  ([place]) 12/03/2021   Acute on chronic systolic CHF (congestive heart failure) ([place]) 12/02/2021   Elevated troponin 12/02/2021   Pancytopenia ([place]) 12/02/2021   Amenorrhea 12/02/2021   Hyperosmolar hyperglycemic state (HHS) ([place]) 08/26/2021   Small intestinal bacterial overgrowth (SIBO) 08/26/2021   Lower extremity edema 08/26/2021   Hyperkalemia 08/14/2021   Hx of insulin dependent diabetes mellitus 08/14/2021   CKD (chronic kidney disease) stage 4, GFR 15-29 ml/min (HCC) 05/13/2021   Chest pain 03/10/2021   Acute on chronic combined systolic and diastolic CHF (congestive heart failure) ([place]) 02/09/2021   History of astrocytoma of brain 02/09/2021   Severe hyperglycemia due to diabetes mellitus ([place]) 01/25/2021   Diarrhea    Elevated transaminase level    Acute combined systolic and diastolic heart failure (HCC)    Nonischemic cardiomyopathy ([place])    Acute decompensated heart failure (HCC)    Elevated liver enzymes    Acute renal failure superimposed on stage 3a chronic kidney disease ([place]) 11/26/2020   Abdominal pain 40/98/1191   Chronic systolic CHF (congestive heart failure) ([place]) 11/23/2020   Essential hypertension 11/23/2020   Hypertriglyceridemia 11/23/2020   Prolonged QT interval 11/23/2020   Type 2 diabetes mellitus with hyperlipidemia ([place]) 10/10/2018   Hypothyroidism 10/10/2018    Current Outpatient Medications:    albuterol (VENTOLIN HFA) 108 (90 Base) MCG/ACT  inhaler, Inhale 1-2 puffs into the lungs every 6 (six) hours as needed for wheezing or shortness of breath., Disp: , Rfl:    aspirin EC 81 MG EC tablet, Take 1 tablet (81 mg total) by mouth daily. Swallow whole., Disp: 30 tablet, Rfl: 1   CREON 36000-114000 units CPEP capsule, Take 36,000 Units by mouth 3 (three) times daily., Disp: , Rfl:    DULoxetine (CYMBALTA) 30 MG capsule, Take 30 mg by mouth at bedtime., Disp: , Rfl:    ergocalciferol (VITAMIN D2) 1.25 MG (50000 UT) capsule, Take 50,000 Units by mouth once a  week., Disp: , Rfl:    Evolocumab (REPATHA) 140 MG/ML SOSY, Inject 140 mg into the skin See admin instructions. Inject 140 mg subcutaneously every other Sunday evening, Disp: , Rfl:    fenofibrate 160 MG tablet, Take 1 tablet (160 mg total) by mouth daily., Disp: 30 tablet, Rfl: 1   ivabradine (CORLANOR) 7.5 MG TABS tablet, Take 1 tablet (7.5 mg total) by mouth 2 (two) times daily with a meal., Disp: 60 tablet, Rfl: 6   levocetirizine (XYZAL) 5 MG tablet, Take 5 mg by mouth daily., Disp: , Rfl:    levothyroxine (SYNTHROID) 125 MCG tablet, Take 1 tablet (125 mcg total) by mouth daily at 6 (six) AM., Disp: 30 tablet, Rfl: 1   pantoprazole (PROTONIX) 40 MG tablet, Take 40 mg by mouth daily., Disp: , Rfl:    pregabalin (LYRICA) 150 MG capsule, Take 300 mg by mouth at bedtime., Disp: , Rfl:    prochlorperazine (COMPAZINE) 5 MG tablet, Take 5 mg by mouth daily., Disp: , Rfl:    rosuvastatin (CRESTOR) 40 MG tablet, Take 40 mg by mouth daily., Disp: , Rfl:    sildenafil (REVATIO) 20 MG tablet, Take 1 tablet (20 mg total) by mouth 3 (three) times daily., Disp: 90 tablet, Rfl: 3   sodium bicarbonate 650 MG tablet, Take 650 mg by mouth 2 (two) times daily., Disp: , Rfl:    torsemide 40 MG TABS, Take 40 mg by mouth daily., Disp: 30 tablet, Rfl: 1   tretinoin (RETIN-A) 0.05 % cream, Apply 1 application topically at bedtime., Disp: , Rfl:    carvedilol (COREG) 3.125 MG tablet, Take 1 tablet (3.125 mg total) by mouth 2 (two) times daily with a meal., Disp: 60 tablet, Rfl: 0   guaiFENesin-dextromethorphan (ROBITUSSIN DM) 100-10 MG/5ML syrup, Take 10 mLs by mouth every 6 (six) hours as needed for cough., Disp: 118 mL, Rfl: 0   Insulin Pen Needle 32G X 4 MM MISC, 1 Device by Does not apply route QID. For use with insulin pens, Disp: 100 each, Rfl: 0   Insulin Regular Human (HUMULIN R U-500 KWIKPEN [place]), Inject 100-120 Units into the skin See admin instructions. 100 units at every meal and 120 units at bedtime., Disp:  , Rfl:    norethindrone-ethinyl estradiol-FE ([name] 1/20) 1-20 MG-MCG tablet, Take 1 tablet by mouth daily., Disp: , Rfl:  Allergies  Allergen Reactions   Ketamine Other (See Comments)    In vegetative state for 15 minutes per pt   Erythromycin     unk   Fentanyl Nausea And Vomiting   Maitake Mushroom [[place]] Itching    Itchy throat   Penicillin V Itching    Gi upset   Shellfish Allergy Other (See Comments)    Pt has never had shellfish but tested positive on allergy test. Pt states contrast in CT is okay   Morphine Itching and Rash  Penicillins Rash    Has patient had a PCN reaction causing immediate rash, facial/tongue/throat swelling, SOB or lightheadedness with hypotension: Y Has patient had a PCN reaction causing severe rash involving mucus membranes or skin necrosis: Y Has patient had a PCN reaction that required hospitalization: N Has patient had a PCN reaction occurring within the last 10 years: Y If all of the above answers are "NO", then may proceed with Cephalosporin use.    Prednisone Rash      Social History   Socioeconomic History   Marital status: Married    Spouse name: Not on file   Number of children: 0   Years of education: Not on file   Highest education level: Not on file  Occupational History   Occupation: [employer]  Tobacco Use   Smoking status: Never   Smokeless tobacco: Never  Vaping Use   Vaping Use: Never used  Substance and Sexual Activity   Alcohol use: Never   Drug use: Never   Sexual activity: Yes  Other Topics Concern   Not on file  Social History Narrative   Not on file   Social Determinants of Health   Financial Resource Strain: High Risk (04/05/2022)   Overall Financial Resource Strain (CARDIA)    Difficulty of Paying Living Expenses: Hard  Food Insecurity: No Food Insecurity (03/14/2022)   Hunger Vital Sign    Worried About Running Out of Food in the Last Year: Never true    Ran Out of Food in the Last Year: Never  true  Transportation Needs: Unmet Transportation Needs (04/05/2022)   PRAPARE - Transportation    Lack of Transportation (Medical): Yes    Lack of Transportation (Non-Medical): Yes  Physical Activity: Sufficiently Active (12/10/2021)   Exercise Vital Sign    Days of Exercise per Week: 3 days    Minutes of Exercise per Session: 60 min  Stress: No Stress Concern Present (12/10/2021)   [place] of Stress : Not at all  Social Connections: [place] (12/10/2021)   Social Connection and Isolation Panel [NHANES]    Frequency of Communication with Friends and Family: More than three times a week    Frequency of Social Gatherings with Friends and Family: More than three times a week    Attends Religious Services: More than 4 times per year    Active Member of [employer] or Organizations: No    Attends Music therapist: More than 4 times per year    Marital Status: Married  Human resources officer Violence: Not At Risk (03/14/2022)   Humiliation, Afraid, Rape, and Kick questionnaire    Fear of Current or Ex-Partner: No    Emotionally Abused: No    Physically Abused: No    Sexually Abused: No    Physical Exam      No future appointments.     [name], [facility] Paramedic  04/11/22

## 2022-04-21 ENCOUNTER — Telehealth (HOSPITAL_COMMUNITY): Payer: Self-pay

## 2022-04-21 NOTE — Telephone Encounter (Signed)
Pt reached out to me reporting she has a fever and ask for [place] to resch our appointment.   She denies needing anything at this time.   [name], [place] 04/21/2022

## 2022-04-26 ENCOUNTER — Other Ambulatory Visit: Payer: Self-pay | Admitting: Gastroenterology

## 2022-04-26 ENCOUNTER — Telehealth (HOSPITAL_COMMUNITY): Payer: Self-pay | Admitting: Adult Health

## 2022-04-26 DIAGNOSIS — K8689 Other specified diseases of pancreas: Secondary | ICD-10-CM | POA: Diagnosis not present

## 2022-04-26 DIAGNOSIS — E119 Type 2 diabetes mellitus without complications: Secondary | ICD-10-CM | POA: Diagnosis not present

## 2022-04-26 DIAGNOSIS — K760 Fatty (change of) liver, not elsewhere classified: Secondary | ICD-10-CM | POA: Diagnosis not present

## 2022-04-26 DIAGNOSIS — E786 Lipoprotein deficiency: Secondary | ICD-10-CM | POA: Diagnosis not present

## 2022-04-26 DIAGNOSIS — K861 Other chronic pancreatitis: Secondary | ICD-10-CM | POA: Diagnosis not present

## 2022-04-26 DIAGNOSIS — Z85841 Personal history of malignant neoplasm of brain: Secondary | ICD-10-CM | POA: Diagnosis not present

## 2022-04-26 DIAGNOSIS — K862 Cyst of pancreas: Secondary | ICD-10-CM

## 2022-04-26 DIAGNOSIS — E782 Mixed hyperlipidemia: Secondary | ICD-10-CM | POA: Diagnosis not present

## 2022-04-26 DIAGNOSIS — E781 Pure hyperglyceridemia: Secondary | ICD-10-CM | POA: Diagnosis not present

## 2022-04-26 DIAGNOSIS — Z794 Long term (current) use of insulin: Secondary | ICD-10-CM | POA: Diagnosis not present

## 2022-04-26 MED ORDER — TORSEMIDE 40 MG PO TABS: 60.0000 mg | ORAL_TABLET | Freq: Every day | ORAL | 6 refills | Status: DC

## 2022-04-26 NOTE — Telephone Encounter (Signed)
   Cardiomems Remote Monitoring  S/P Cardiomems Implant   PAD Goal: 17 Most recent reading: 21  Recommended changes: Increase torsemide to 60 mg daily   I continue to review and analyze the patients PA pressures weekly (and more often as needed) to bring PA pressures within the optimal range.     Discussed. Increase torsemide 60 mg daily,. Medication changed in the medication list.    Mackenzie Groom NP-C  3:30 PM

## 2022-04-27 ENCOUNTER — Other Ambulatory Visit (HOSPITAL_COMMUNITY): Payer: Self-pay

## 2022-04-27 DIAGNOSIS — Z7689 Persons encountering health services in other specified circumstances: Secondary | ICD-10-CM | POA: Diagnosis not present

## 2022-04-27 DIAGNOSIS — H34832 Tributary (branch) retinal vein occlusion, left eye, with macular edema: Secondary | ICD-10-CM | POA: Diagnosis not present

## 2022-04-27 NOTE — Progress Notes (Signed)
Telephone encounter---pt had to cancel out appointment this morning due to her having another appointment. Unfortunately due to my schedule this afternoon and tomor I do not think I will be able to see her this week.    [name], [place] 04/27/2022

## 2022-04-28 ENCOUNTER — Encounter (HOSPITAL_COMMUNITY): Payer: BC Managed Care – PPO

## 2022-04-28 DIAGNOSIS — Z419 Encounter for procedure for purposes other than remedying health state, unspecified: Secondary | ICD-10-CM | POA: Diagnosis not present

## 2022-05-02 DIAGNOSIS — E139 Other specified diabetes mellitus without complications: Secondary | ICD-10-CM | POA: Diagnosis not present

## 2022-05-04 DIAGNOSIS — R0981 Nasal congestion: Secondary | ICD-10-CM | POA: Diagnosis not present

## 2022-05-04 DIAGNOSIS — J329 Chronic sinusitis, unspecified: Secondary | ICD-10-CM | POA: Diagnosis not present

## 2022-05-04 DIAGNOSIS — R300 Dysuria: Secondary | ICD-10-CM | POA: Diagnosis not present

## 2022-05-04 DIAGNOSIS — N3090 Cystitis, unspecified without hematuria: Secondary | ICD-10-CM | POA: Diagnosis not present

## 2022-05-04 DIAGNOSIS — R3915 Urgency of urination: Secondary | ICD-10-CM | POA: Diagnosis not present

## 2022-05-04 DIAGNOSIS — R350 Frequency of micturition: Secondary | ICD-10-CM | POA: Diagnosis not present

## 2022-05-04 DIAGNOSIS — J3489 Other specified disorders of nose and nasal sinuses: Secondary | ICD-10-CM | POA: Diagnosis not present

## 2022-05-04 DIAGNOSIS — Z1330 Encounter for screening examination for mental health and behavioral disorders, unspecified: Secondary | ICD-10-CM | POA: Diagnosis not present

## 2022-05-05 ENCOUNTER — Other Ambulatory Visit (HOSPITAL_COMMUNITY): Payer: Self-pay

## 2022-05-05 DIAGNOSIS — H34811 Central retinal vein occlusion, right eye, with macular edema: Secondary | ICD-10-CM | POA: Diagnosis not present

## 2022-05-05 NOTE — Progress Notes (Signed)
Paramedicine Encounter    Patient ID: Jennifer [name], female    DOB: [birth year], 32 y.o.   MRN: [id number]   Complaints-none  Edema-none   Compliance with meds-yes  Pill box filled-yes If so, by whom-paramedic   Refills needed- Sildeanfil -ordered from [facility] for delivery and she will need to place it in pill box --per pharmacy it needs PA-pharm reached out to provider and I also sent message to pharm team at clinic   Sodium bicarb    CBGs-200s  BP (!) 170/104   Pulse 100   Resp 18   Wt 133 lb (60.3 kg)   SpO2 98%   BMI 28.78 kg/m no meds this morning   Weight yesterday-133 Last visit weight-138  Pt reports she is doing ok. [name] taking it one day at a time.  She went to endo at [facility] this past week. Her insulin was decreased and seems to be working.  She denies increased sob unless exertion-like taking the 2nd dog down stairs to use bathroom and back up stairs she gets sob but after a few min it subsides.  No edema noted.   Her cardiomems was elevated last week and her torsemide was increased to '60mg'$  daily. She reports she has been doing that, didn't get to see her last week due to some other appointments she had forgotten about.  Her cardiomems today was much better Today I filled up pill box, she typically does it but didn't get around to it yet.   She got prescribed cipro for 3 days for UTI and sinus infection.  She got it from [facility] provider.   B/p elevated, has not taken meds yet this morning.  She reports taking meds last night.  Meds verified and pill box refilled.    Patient Care Team: Elinor Parkinson as PCP - General (Physician Assistant) Jacelyn Pi, MD as PCP - Endocrinology (Endocrinology)  Patient Active Problem List   Diagnosis Date Noted   Hypokalemia 03/23/2022   COVID 03/15/2022   COVID-19 virus infection 03/14/2022   Hyponatremia 03/14/2022   Positive D dimer 03/14/2022   Stage 3b chronic kidney disease (CKD)  ([place]) 03/14/2022   Pulmonary hypertension ([place]) 02/06/2022   NASH (nonalcoholic steatohepatitis) 02/06/2022   Obesity (BMI 30-39.9) 02/06/2022   Heart failure ([place]) 12/03/2021   Acute on chronic systolic CHF (congestive heart failure) ([place]) 12/02/2021   Elevated troponin 12/02/2021   Pancytopenia ([place]) 12/02/2021   Amenorrhea 12/02/2021   Hyperosmolar hyperglycemic state (HHS) ([place]) 08/26/2021   Small intestinal bacterial overgrowth (SIBO) 08/26/2021   Lower extremity edema 08/26/2021   Hyperkalemia 08/14/2021   Hx of insulin dependent diabetes mellitus 08/14/2021   CKD (chronic kidney disease) stage 4, GFR 15-29 ml/min (HCC) 05/13/2021   Chest pain 03/10/2021   Acute on chronic combined systolic and diastolic CHF (congestive heart failure) ([place]) 02/09/2021   History of astrocytoma of brain 02/09/2021   Severe hyperglycemia due to diabetes mellitus ([place]) 01/25/2021   Diarrhea    Elevated transaminase level    Acute combined systolic and diastolic heart failure (HCC)    Nonischemic cardiomyopathy ([place])    Acute decompensated heart failure (HCC)    Elevated liver enzymes    Acute renal failure superimposed on stage 3a chronic kidney disease ([place]) 11/26/2020   Abdominal pain 65/68/1275   Chronic systolic CHF (congestive heart failure) ([place]) 11/23/2020   Essential hypertension 11/23/2020   Hypertriglyceridemia 11/23/2020   Prolonged QT interval 11/23/2020   Type 2 diabetes  mellitus with hyperlipidemia ([place]) 10/10/2018   Hypothyroidism 10/10/2018    Current Outpatient Medications:    albuterol (VENTOLIN HFA) 108 (90 Base) MCG/ACT inhaler, Inhale 1-2 puffs into the lungs every 6 (six) hours as needed for wheezing or shortness of breath., Disp: , Rfl:    aspirin EC 81 MG EC tablet, Take 1 tablet (81 mg total) by mouth daily. Swallow whole., Disp: 30 tablet, Rfl: 1   ciprofloxacin (CIPRO) 500 MG tablet, Take 500 mg by mouth 2 (two) times daily., Disp: , Rfl:    CREON 36000-114000 units  CPEP capsule, Take 36,000 Units by mouth 3 (three) times daily., Disp: , Rfl:    DULoxetine (CYMBALTA) 30 MG capsule, Take 30 mg by mouth at bedtime., Disp: , Rfl:    ergocalciferol (VITAMIN D2) 1.25 MG (50000 UT) capsule, Take 50,000 Units by mouth once a week., Disp: , Rfl:    Evolocumab (REPATHA) 140 MG/ML SOSY, Inject 140 mg into the skin See admin instructions. Inject 140 mg subcutaneously every other Sunday evening, Disp: , Rfl:    fenofibrate 160 MG tablet, Take 1 tablet (160 mg total) by mouth daily., Disp: 30 tablet, Rfl: 1   Insulin Pen Needle 32G X 4 MM MISC, 1 Device by Does not apply route QID. For use with insulin pens, Disp: 100 each, Rfl: 0   Insulin Regular Human (HUMULIN R U-500 KWIKPEN [place]), Inject 100-120 Units into the skin See admin instructions. 290 units at every meal and 100 units at bedtime., Disp: , Rfl:    ivabradine (CORLANOR) 7.5 MG TABS tablet, Take 1 tablet (7.5 mg total) by mouth 2 (two) times daily with a meal., Disp: 60 tablet, Rfl: 6   levocetirizine (XYZAL) 5 MG tablet, Take 5 mg by mouth daily., Disp: , Rfl:    levothyroxine (SYNTHROID) 125 MCG tablet, Take 1 tablet (125 mcg total) by mouth daily at 6 (six) AM., Disp: 30 tablet, Rfl: 1   norethindrone-ethinyl estradiol-FE ([name] 1/20) 1-20 MG-MCG tablet, Take 1 tablet by mouth daily., Disp: , Rfl:    pantoprazole (PROTONIX) 40 MG tablet, Take 40 mg by mouth daily., Disp: , Rfl:    pregabalin (LYRICA) 150 MG capsule, Take 300 mg by mouth at bedtime., Disp: , Rfl:    prochlorperazine (COMPAZINE) 5 MG tablet, Take 5 mg by mouth daily., Disp: , Rfl:    rosuvastatin (CRESTOR) 40 MG tablet, Take 40 mg by mouth daily., Disp: , Rfl:    sildenafil (REVATIO) 20 MG tablet, Take 1 tablet (20 mg total) by mouth 3 (three) times daily., Disp: 90 tablet, Rfl: 3   sodium bicarbonate 650 MG tablet, Take 650 mg by mouth 2 (two) times daily., Disp: , Rfl:    Torsemide 40 MG TABS, Take 60 mg by mouth daily., Disp: 90 tablet, Rfl:  6   tretinoin (RETIN-A) 0.05 % cream, Apply 1 application topically at bedtime., Disp: , Rfl:    carvedilol (COREG) 3.125 MG tablet, Take 1 tablet (3.125 mg total) by mouth 2 (two) times daily with a meal., Disp: 60 tablet, Rfl: 0   guaiFENesin-dextromethorphan (ROBITUSSIN DM) 100-10 MG/5ML syrup, Take 10 mLs by mouth every 6 (six) hours as needed for cough. (Patient not taking: Reported on 05/05/2022), Disp: 118 mL, Rfl: 0 Allergies  Allergen Reactions   Ketamine Other (See Comments)    In vegetative state for 15 minutes per pt   Erythromycin     unk   Fentanyl Nausea And Vomiting   Maitake Mushroom [[place]] Itching  Itchy throat   Penicillin V Itching    Gi upset   Shellfish Allergy Other (See Comments)    Pt has never had shellfish but tested positive on allergy test. Pt states contrast in CT is okay   Morphine Itching and Rash   Penicillins Rash    Has patient had a PCN reaction causing immediate rash, facial/tongue/throat swelling, SOB or lightheadedness with hypotension: Y Has patient had a PCN reaction causing severe rash involving mucus membranes or skin necrosis: Y Has patient had a PCN reaction that required hospitalization: N Has patient had a PCN reaction occurring within the last 10 years: Y If all of the above answers are "NO", then may proceed with Cephalosporin use.    Prednisone Rash      Social History   Socioeconomic History   Marital status: Married    Spouse name: Not on file   Number of children: 0   Years of education: Not on file   Highest education level: Not on file  Occupational History   Occupation: [employer]  Tobacco Use   Smoking status: Never   Smokeless tobacco: Never  Vaping Use   Vaping Use: Never used  Substance and Sexual Activity   Alcohol use: Never   Drug use: Never   Sexual activity: Yes  Other Topics Concern   Not on file  Social History Narrative   Not on file   Social Determinants of Health   Financial Resource  Strain: High Risk (04/05/2022)   Overall Financial Resource Strain (CARDIA)    Difficulty of Paying Living Expenses: Hard  Food Insecurity: No Food Insecurity (03/14/2022)   Hunger Vital Sign    Worried About Running Out of Food in the Last Year: Never true    Ran Out of Food in the Last Year: Never true  Transportation Needs: Unmet Transportation Needs (04/05/2022)   PRAPARE - Transportation    Lack of Transportation (Medical): Yes    Lack of Transportation (Non-Medical): Yes  Physical Activity: Sufficiently Active (12/10/2021)   Exercise Vital Sign    Days of Exercise per Week: 3 days    Minutes of Exercise per Session: 60 min  Stress: No Stress Concern Present (12/10/2021)   [place] of Stress : Not at all  Social Connections: [place] (12/10/2021)   Social Connection and Isolation Panel [NHANES]    Frequency of Communication with Friends and Family: More than three times a week    Frequency of Social Gatherings with Friends and Family: More than three times a week    Attends Religious Services: More than 4 times per year    Active Member of Clubs or Organizations: No    Attends Music therapist: More than 4 times per year    Marital Status: Married  Human resources officer Violence: Not At Risk (03/14/2022)   Humiliation, Afraid, Rape, and Kick questionnaire    Fear of Current or Ex-Partner: No    Emotionally Abused: No    Physically Abused: No    Sexually Abused: No    Physical Exam      Future Appointments  Date Time Provider [place]  05/11/2022  7:00 AM GI-315 MR 2 [id number] [place], [place] Paramedic  05/05/22

## 2022-05-11 ENCOUNTER — Other Ambulatory Visit (HOSPITAL_COMMUNITY): Payer: Self-pay

## 2022-05-11 ENCOUNTER — Other Ambulatory Visit (HOSPITAL_COMMUNITY): Payer: Self-pay | Admitting: Pharmacist

## 2022-05-11 ENCOUNTER — Ambulatory Visit
Admission: RE | Admit: 2022-05-11 | Discharge: 2022-05-11 | Disposition: A | Discharge status: Home / Self Care | Payer: Medicaid Other | Source: Ambulatory Visit | Attending: Gastroenterology | Admitting: Gastroenterology

## 2022-05-11 ENCOUNTER — Telehealth (HOSPITAL_COMMUNITY): Payer: Self-pay | Admitting: Pharmacy Technician

## 2022-05-11 DIAGNOSIS — Z7689 Persons encountering health services in other specified circumstances: Secondary | ICD-10-CM | POA: Diagnosis not present

## 2022-05-11 DIAGNOSIS — K862 Cyst of pancreas: Secondary | ICD-10-CM

## 2022-05-11 MED ORDER — SILDENAFIL CITRATE 20 MG PO TABS: 20.0000 mg | ORAL_TABLET | Freq: Three times a day (TID) | ORAL | 3 refills | Status: DC

## 2022-05-11 NOTE — Telephone Encounter (Signed)
Advanced Heart Failure Patient Advocate Encounter  Prior Authorization for Sildenafil has been approved.    PA#  [id number] Effective dates: 05/11/22 through 05/11/23  Charlann Boxer, CPhT

## 2022-05-11 NOTE — Telephone Encounter (Signed)
Patient Advocate Encounter   Received notification from [facility] that prior authorization for Sildenafil is required.   PA submitted on CoverMyMeds Key [id number] Status is pending   Will continue to follow.

## 2022-05-12 ENCOUNTER — Telehealth (HOSPITAL_COMMUNITY): Payer: Self-pay

## 2022-05-12 DIAGNOSIS — E875 Hyperkalemia: Secondary | ICD-10-CM | POA: Diagnosis not present

## 2022-05-12 DIAGNOSIS — L680 Hirsutism: Secondary | ICD-10-CM | POA: Diagnosis not present

## 2022-05-12 DIAGNOSIS — T17808A Unspecified foreign body in other parts of respiratory tract causing other injury, initial encounter: Secondary | ICD-10-CM | POA: Diagnosis not present

## 2022-05-12 DIAGNOSIS — N939 Abnormal uterine and vaginal bleeding, unspecified: Secondary | ICD-10-CM | POA: Diagnosis not present

## 2022-05-12 DIAGNOSIS — R000 Tachycardia, unspecified: Secondary | ICD-10-CM | POA: Diagnosis not present

## 2022-05-12 DIAGNOSIS — Z041 Encounter for examination and observation following transport accident: Secondary | ICD-10-CM | POA: Diagnosis not present

## 2022-05-12 DIAGNOSIS — R1030 Lower abdominal pain, unspecified: Secondary | ICD-10-CM | POA: Diagnosis not present

## 2022-05-12 DIAGNOSIS — M25519 Pain in unspecified shoulder: Secondary | ICD-10-CM | POA: Diagnosis not present

## 2022-05-12 DIAGNOSIS — R1084 Generalized abdominal pain: Secondary | ICD-10-CM | POA: Diagnosis not present

## 2022-05-12 DIAGNOSIS — E1165 Type 2 diabetes mellitus with hyperglycemia: Secondary | ICD-10-CM | POA: Diagnosis not present

## 2022-05-12 DIAGNOSIS — R9389 Abnormal findings on diagnostic imaging of other specified body structures: Secondary | ICD-10-CM | POA: Diagnosis not present

## 2022-05-12 DIAGNOSIS — R58 Hemorrhage, not elsewhere classified: Secondary | ICD-10-CM | POA: Diagnosis not present

## 2022-05-12 DIAGNOSIS — R9431 Abnormal electrocardiogram [ECG] [EKG]: Secondary | ICD-10-CM | POA: Diagnosis not present

## 2022-05-12 DIAGNOSIS — R7989 Other specified abnormal findings of blood chemistry: Secondary | ICD-10-CM | POA: Diagnosis not present

## 2022-05-12 DIAGNOSIS — R1909 Other intra-abdominal and pelvic swelling, mass and lump: Secondary | ICD-10-CM | POA: Diagnosis not present

## 2022-05-12 DIAGNOSIS — N390 Urinary tract infection, site not specified: Secondary | ICD-10-CM | POA: Diagnosis not present

## 2022-05-12 DIAGNOSIS — I10 Essential (primary) hypertension: Secondary | ICD-10-CM | POA: Diagnosis not present

## 2022-05-12 DIAGNOSIS — R109 Unspecified abdominal pain: Secondary | ICD-10-CM | POA: Diagnosis not present

## 2022-05-12 DIAGNOSIS — R7401 Elevation of levels of liver transaminase levels: Secondary | ICD-10-CM | POA: Diagnosis not present

## 2022-05-12 NOTE — Telephone Encounter (Addendum)
Pt reached out to cancel our appointment for today due to her work schedule.  Will f/u next week.   Pharmacy reached out that her doc sent over G6 and G7 sensors and transmitters--it needs to be one or the other per pharmacy.  I let her know this as well and to get her provider to correct this.     [name], [place] 05/12/2022

## 2022-05-17 DIAGNOSIS — L0390 Cellulitis, unspecified: Secondary | ICD-10-CM | POA: Diagnosis not present

## 2022-05-17 DIAGNOSIS — L0291 Cutaneous abscess, unspecified: Secondary | ICD-10-CM | POA: Diagnosis not present

## 2022-05-18 ENCOUNTER — Other Ambulatory Visit (HOSPITAL_COMMUNITY): Payer: Self-pay
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
## 2022-05-19 DIAGNOSIS — R7989 Other specified abnormal findings of blood chemistry: Secondary | ICD-10-CM | POA: Diagnosis not present

## 2022-05-19 DIAGNOSIS — Z124 Encounter for screening for malignant neoplasm of cervix: Secondary | ICD-10-CM | POA: Diagnosis not present

## 2022-05-19 DIAGNOSIS — Z1151 Encounter for screening for human papillomavirus (HPV): Secondary | ICD-10-CM | POA: Diagnosis not present

## 2022-05-19 DIAGNOSIS — N762 Acute vulvitis: Secondary | ICD-10-CM | POA: Diagnosis not present

## 2022-05-19 DIAGNOSIS — E282 Polycystic ovarian syndrome: Secondary | ICD-10-CM | POA: Diagnosis not present

## 2022-05-19 DIAGNOSIS — E1069 Type 1 diabetes mellitus with other specified complication: Secondary | ICD-10-CM | POA: Diagnosis not present

## 2022-05-19 DIAGNOSIS — L680 Hirsutism: Secondary | ICD-10-CM | POA: Diagnosis not present

## 2022-05-19 DIAGNOSIS — R87610 Atypical squamous cells of undetermined significance on cytologic smear of cervix (ASC-US): Secondary | ICD-10-CM | POA: Diagnosis not present

## 2022-05-23 ENCOUNTER — Telehealth (HOSPITAL_COMMUNITY): Payer: Self-pay

## 2022-05-23 NOTE — Telephone Encounter (Signed)
Reached out to pt about her sch for a home visit this week.  She isnt sure yet but will let me know.   Jennifer [name], [place] 05/23/2022

## 2022-05-24 DIAGNOSIS — L680 Hirsutism: Secondary | ICD-10-CM | POA: Diagnosis not present

## 2022-05-24 DIAGNOSIS — E213 Hyperparathyroidism, unspecified: Secondary | ICD-10-CM | POA: Diagnosis not present

## 2022-05-24 DIAGNOSIS — E786 Lipoprotein deficiency: Secondary | ICD-10-CM | POA: Diagnosis not present

## 2022-05-24 DIAGNOSIS — E282 Polycystic ovarian syndrome: Secondary | ICD-10-CM | POA: Diagnosis not present

## 2022-05-27 DIAGNOSIS — Z419 Encounter for procedure for purposes other than remedying health state, unspecified: Secondary | ICD-10-CM | POA: Diagnosis not present

## 2022-05-29 ENCOUNTER — Other Ambulatory Visit: Payer: Self-pay

## 2022-05-29 ENCOUNTER — Encounter (HOSPITAL_BASED_OUTPATIENT_CLINIC_OR_DEPARTMENT_OTHER): Payer: Self-pay | Admitting: *Deleted

## 2022-05-29 ENCOUNTER — Emergency Department (HOSPITAL_BASED_OUTPATIENT_CLINIC_OR_DEPARTMENT_OTHER): Payer: Medicaid Other

## 2022-05-29 ENCOUNTER — Inpatient Hospital Stay (HOSPITAL_BASED_OUTPATIENT_CLINIC_OR_DEPARTMENT_OTHER)
Admission: EM | Admit: 2022-05-29 | Discharge: 2022-06-12 | DRG: 637 | Disposition: A | Discharge status: Home / Self Care | Payer: Medicaid Other | Attending: Family Medicine | Admitting: Family Medicine

## 2022-05-29 DIAGNOSIS — E1122 Type 2 diabetes mellitus with diabetic chronic kidney disease: Secondary | ICD-10-CM | POA: Diagnosis present

## 2022-05-29 DIAGNOSIS — I340 Nonrheumatic mitral (valve) insufficiency: Secondary | ICD-10-CM | POA: Diagnosis not present

## 2022-05-29 DIAGNOSIS — I5033 Acute on chronic diastolic (congestive) heart failure: Secondary | ICD-10-CM | POA: Diagnosis not present

## 2022-05-29 DIAGNOSIS — K861 Other chronic pancreatitis: Secondary | ICD-10-CM | POA: Diagnosis present

## 2022-05-29 DIAGNOSIS — K7581 Nonalcoholic steatohepatitis (NASH): Secondary | ICD-10-CM | POA: Diagnosis present

## 2022-05-29 DIAGNOSIS — Z79890 Hormone replacement therapy (postmenopausal): Secondary | ICD-10-CM

## 2022-05-29 DIAGNOSIS — R109 Unspecified abdominal pain: Secondary | ICD-10-CM | POA: Diagnosis present

## 2022-05-29 DIAGNOSIS — R0602 Shortness of breath: Secondary | ICD-10-CM | POA: Diagnosis not present

## 2022-05-29 DIAGNOSIS — E1143 Type 2 diabetes mellitus with diabetic autonomic (poly)neuropathy: Secondary | ICD-10-CM | POA: Diagnosis present

## 2022-05-29 DIAGNOSIS — I130 Hypertensive heart and chronic kidney disease with heart failure and stage 1 through stage 4 chronic kidney disease, or unspecified chronic kidney disease: Secondary | ICD-10-CM | POA: Diagnosis not present

## 2022-05-29 DIAGNOSIS — Z83438 Family history of other disorder of lipoprotein metabolism and other lipidemia: Secondary | ICD-10-CM

## 2022-05-29 DIAGNOSIS — Z85841 Personal history of malignant neoplasm of brain: Secondary | ICD-10-CM

## 2022-05-29 DIAGNOSIS — I428 Other cardiomyopathies: Secondary | ICD-10-CM | POA: Diagnosis present

## 2022-05-29 DIAGNOSIS — E1169 Type 2 diabetes mellitus with other specified complication: Secondary | ICD-10-CM | POA: Diagnosis present

## 2022-05-29 DIAGNOSIS — E869 Volume depletion, unspecified: Secondary | ICD-10-CM | POA: Diagnosis present

## 2022-05-29 DIAGNOSIS — R112 Nausea with vomiting, unspecified: Secondary | ICD-10-CM | POA: Diagnosis not present

## 2022-05-29 DIAGNOSIS — B952 Enterococcus as the cause of diseases classified elsewhere: Secondary | ICD-10-CM | POA: Insufficient documentation

## 2022-05-29 DIAGNOSIS — N3001 Acute cystitis with hematuria: Secondary | ICD-10-CM | POA: Diagnosis not present

## 2022-05-29 DIAGNOSIS — E786 Lipoprotein deficiency: Secondary | ICD-10-CM | POA: Diagnosis present

## 2022-05-29 DIAGNOSIS — K7200 Acute and subacute hepatic failure without coma: Secondary | ICD-10-CM | POA: Diagnosis not present

## 2022-05-29 DIAGNOSIS — Z79899 Other long term (current) drug therapy: Secondary | ICD-10-CM

## 2022-05-29 DIAGNOSIS — Z888 Allergy status to other drugs, medicaments and biological substances status: Secondary | ICD-10-CM

## 2022-05-29 DIAGNOSIS — I272 Pulmonary hypertension, unspecified: Secondary | ICD-10-CM | POA: Diagnosis not present

## 2022-05-29 DIAGNOSIS — Z881 Allergy status to other antibiotic agents status: Secondary | ICD-10-CM

## 2022-05-29 DIAGNOSIS — B9620 Unspecified Escherichia coli [E. coli] as the cause of diseases classified elsewhere: Secondary | ICD-10-CM | POA: Insufficient documentation

## 2022-05-29 DIAGNOSIS — Z91013 Allergy to seafood: Secondary | ICD-10-CM

## 2022-05-29 DIAGNOSIS — N184 Chronic kidney disease, stage 4 (severe): Secondary | ICD-10-CM | POA: Diagnosis not present

## 2022-05-29 DIAGNOSIS — R062 Wheezing: Secondary | ICD-10-CM | POA: Diagnosis not present

## 2022-05-29 DIAGNOSIS — R570 Cardiogenic shock: Secondary | ICD-10-CM | POA: Diagnosis not present

## 2022-05-29 DIAGNOSIS — D631 Anemia in chronic kidney disease: Secondary | ICD-10-CM | POA: Diagnosis not present

## 2022-05-29 DIAGNOSIS — N179 Acute kidney failure, unspecified: Secondary | ICD-10-CM | POA: Diagnosis not present

## 2022-05-29 DIAGNOSIS — S301XXA Contusion of abdominal wall, initial encounter: Secondary | ICD-10-CM | POA: Diagnosis not present

## 2022-05-29 DIAGNOSIS — J9601 Acute respiratory failure with hypoxia: Secondary | ICD-10-CM | POA: Diagnosis not present

## 2022-05-29 DIAGNOSIS — I5022 Chronic systolic (congestive) heart failure: Secondary | ICD-10-CM | POA: Diagnosis present

## 2022-05-29 DIAGNOSIS — G8194 Hemiplegia, unspecified affecting left nondominant side: Secondary | ICD-10-CM | POA: Diagnosis present

## 2022-05-29 DIAGNOSIS — I5043 Acute on chronic combined systolic (congestive) and diastolic (congestive) heart failure: Secondary | ICD-10-CM | POA: Diagnosis not present

## 2022-05-29 DIAGNOSIS — R1013 Epigastric pain: Secondary | ICD-10-CM | POA: Diagnosis not present

## 2022-05-29 DIAGNOSIS — I081 Rheumatic disorders of both mitral and tricuspid valves: Secondary | ICD-10-CM | POA: Diagnosis present

## 2022-05-29 DIAGNOSIS — K858 Other acute pancreatitis without necrosis or infection: Principal | ICD-10-CM | POA: Diagnosis present

## 2022-05-29 DIAGNOSIS — Z880 Allergy status to penicillin: Secondary | ICD-10-CM

## 2022-05-29 DIAGNOSIS — E781 Pure hyperglyceridemia: Principal | ICD-10-CM

## 2022-05-29 DIAGNOSIS — K859 Acute pancreatitis without necrosis or infection, unspecified: Secondary | ICD-10-CM | POA: Diagnosis not present

## 2022-05-29 DIAGNOSIS — J811 Chronic pulmonary edema: Secondary | ICD-10-CM | POA: Diagnosis not present

## 2022-05-29 DIAGNOSIS — J9811 Atelectasis: Secondary | ICD-10-CM | POA: Diagnosis not present

## 2022-05-29 DIAGNOSIS — I5023 Acute on chronic systolic (congestive) heart failure: Secondary | ICD-10-CM | POA: Diagnosis not present

## 2022-05-29 DIAGNOSIS — E039 Hypothyroidism, unspecified: Secondary | ICD-10-CM | POA: Diagnosis present

## 2022-05-29 DIAGNOSIS — Z95818 Presence of other cardiac implants and grafts: Secondary | ICD-10-CM

## 2022-05-29 DIAGNOSIS — E11649 Type 2 diabetes mellitus with hypoglycemia without coma: Secondary | ICD-10-CM | POA: Diagnosis not present

## 2022-05-29 DIAGNOSIS — E7800 Pure hypercholesterolemia, unspecified: Secondary | ICD-10-CM | POA: Diagnosis present

## 2022-05-29 DIAGNOSIS — Z91148 Patient's other noncompliance with medication regimen for other reason: Secondary | ICD-10-CM

## 2022-05-29 DIAGNOSIS — R748 Abnormal levels of other serum enzymes: Secondary | ICD-10-CM | POA: Diagnosis present

## 2022-05-29 DIAGNOSIS — E1110 Type 2 diabetes mellitus with ketoacidosis without coma: Secondary | ICD-10-CM | POA: Diagnosis not present

## 2022-05-29 DIAGNOSIS — N390 Urinary tract infection, site not specified: Secondary | ICD-10-CM | POA: Insufficient documentation

## 2022-05-29 DIAGNOSIS — R162 Hepatomegaly with splenomegaly, not elsewhere classified: Secondary | ICD-10-CM | POA: Diagnosis not present

## 2022-05-29 DIAGNOSIS — E871 Hypo-osmolality and hyponatremia: Secondary | ICD-10-CM | POA: Diagnosis not present

## 2022-05-29 DIAGNOSIS — Z833 Family history of diabetes mellitus: Secondary | ICD-10-CM

## 2022-05-29 DIAGNOSIS — Z7982 Long term (current) use of aspirin: Secondary | ICD-10-CM

## 2022-05-29 DIAGNOSIS — I429 Cardiomyopathy, unspecified: Secondary | ICD-10-CM | POA: Diagnosis not present

## 2022-05-29 DIAGNOSIS — Z91018 Allergy to other foods: Secondary | ICD-10-CM

## 2022-05-29 DIAGNOSIS — Z794 Long term (current) use of insulin: Secondary | ICD-10-CM | POA: Diagnosis not present

## 2022-05-29 DIAGNOSIS — I2510 Atherosclerotic heart disease of native coronary artery without angina pectoris: Secondary | ICD-10-CM | POA: Diagnosis present

## 2022-05-29 DIAGNOSIS — Z8349 Family history of other endocrine, nutritional and metabolic diseases: Secondary | ICD-10-CM

## 2022-05-29 DIAGNOSIS — K3184 Gastroparesis: Secondary | ICD-10-CM | POA: Diagnosis present

## 2022-05-29 DIAGNOSIS — E1010 Type 1 diabetes mellitus with ketoacidosis without coma: Secondary | ICD-10-CM | POA: Diagnosis not present

## 2022-05-29 DIAGNOSIS — R0902 Hypoxemia: Secondary | ICD-10-CM | POA: Diagnosis not present

## 2022-05-29 DIAGNOSIS — E282 Polycystic ovarian syndrome: Secondary | ICD-10-CM | POA: Diagnosis present

## 2022-05-29 DIAGNOSIS — Z8249 Family history of ischemic heart disease and other diseases of the circulatory system: Secondary | ICD-10-CM

## 2022-05-29 DIAGNOSIS — I5021 Acute systolic (congestive) heart failure: Secondary | ICD-10-CM | POA: Diagnosis not present

## 2022-05-29 DIAGNOSIS — N1831 Chronic kidney disease, stage 3a: Secondary | ICD-10-CM | POA: Diagnosis not present

## 2022-05-29 DIAGNOSIS — Z885 Allergy status to narcotic agent status: Secondary | ICD-10-CM

## 2022-05-29 DIAGNOSIS — E875 Hyperkalemia: Secondary | ICD-10-CM | POA: Diagnosis present

## 2022-05-29 DIAGNOSIS — D649 Anemia, unspecified: Secondary | ICD-10-CM | POA: Diagnosis not present

## 2022-05-29 DIAGNOSIS — N1832 Chronic kidney disease, stage 3b: Secondary | ICD-10-CM | POA: Diagnosis present

## 2022-05-29 DIAGNOSIS — I10 Essential (primary) hypertension: Secondary | ICD-10-CM | POA: Diagnosis present

## 2022-05-29 DIAGNOSIS — I129 Hypertensive chronic kidney disease with stage 1 through stage 4 chronic kidney disease, or unspecified chronic kidney disease: Secondary | ICD-10-CM | POA: Diagnosis not present

## 2022-05-29 LAB — URINALYSIS, W/ REFLEX TO CULTURE (INFECTION SUSPECTED)
Bilirubin Urine: NEGATIVE
Glucose, UA: >1000 mg/dL — AB
Ketones, ur: NEGATIVE mg/dL
Nitrite: NEGATIVE
Protein, ur: 100 mg/dL — AB
Specific Gravity, Urine: 1.02 (ref 1.005–1.030)
WBC, UA: >50 WBC/hpf (ref 0–5)
pH: 5.5 (ref 5.0–8.0)

## 2022-05-29 LAB — BASIC METABOLIC PANEL
Anion gap: 16 — ABNORMAL HIGH (ref 5–15)
Anion gap: 19 — ABNORMAL HIGH (ref 5–15)
Anion gap: 18 — ABNORMAL HIGH (ref 5–15)
BUN: 39 mg/dL — ABNORMAL HIGH (ref 6–20)
BUN: 40 mg/dL — ABNORMAL HIGH (ref 6–20)
BUN: 36 mg/dL — ABNORMAL HIGH (ref 6–20)
CO2: 16 mmol/L — ABNORMAL LOW (ref 22–32)
CO2: 14 mmol/L — ABNORMAL LOW (ref 22–32)
CO2: 13 mmol/L — ABNORMAL LOW (ref 22–32)
Calcium: 8.6 mg/dL — ABNORMAL LOW (ref 8.9–10.3)
Calcium: 8.7 mg/dL — ABNORMAL LOW (ref 8.9–10.3)
Calcium: 7.6 mg/dL — ABNORMAL LOW (ref 8.9–10.3)
Chloride: 98 mmol/L (ref 98–111)
Chloride: 103 mmol/L (ref 98–111)
Chloride: 104 mmol/L (ref 98–111)
Creatinine, Ser: 1.53 mg/dL — ABNORMAL HIGH (ref 0.44–1.00)
Creatinine, Ser: 1.8 mg/dL — ABNORMAL HIGH (ref 0.44–1.00)
Creatinine, Ser: 1.58 mg/dL — ABNORMAL HIGH (ref 0.44–1.00)
GFR, Estimated: 46 mL/min — ABNORMAL LOW (ref >60)
GFR, Estimated: 38 mL/min — ABNORMAL LOW (ref >60)
GFR, Estimated: 45 mL/min — ABNORMAL LOW (ref >60)
Glucose, Bld: 338 mg/dL — ABNORMAL HIGH (ref 70–99)
Glucose, Bld: 238 mg/dL — ABNORMAL HIGH (ref 70–99)
Glucose, Bld: 723 mg/dL (ref 70–99)
Potassium: 4.7 mmol/L (ref 3.5–5.1)
Potassium: 5.6 mmol/L — ABNORMAL HIGH (ref 3.5–5.1)
Potassium: 4.5 mmol/L (ref 3.5–5.1)
Sodium: 130 mmol/L — ABNORMAL LOW (ref 135–145)
Sodium: 136 mmol/L (ref 135–145)
Sodium: 135 mmol/L (ref 135–145)

## 2022-05-29 LAB — T4, FREE: Free T4: 0.74 ng/dL (ref 0.61–1.12)

## 2022-05-29 LAB — PREGNANCY, URINE: Preg Test, Ur: NEGATIVE

## 2022-05-29 LAB — COMPREHENSIVE METABOLIC PANEL
ALT: 95 U/L — ABNORMAL HIGH (ref 0–44)
AST: 149 U/L — ABNORMAL HIGH (ref 15–41)
Albumin: 3.5 g/dL (ref 3.5–5.0)
Alkaline Phosphatase: 208 U/L — ABNORMAL HIGH (ref 38–126)
Anion gap: 18 — ABNORMAL HIGH (ref 5–15)
BUN: 40 mg/dL — ABNORMAL HIGH (ref 6–20)
CO2: 19 mmol/L — ABNORMAL LOW (ref 22–32)
Calcium: 9.3 mg/dL (ref 8.9–10.3)
Chloride: 98 mmol/L (ref 98–111)
Creatinine, Ser: 1.65 mg/dL — ABNORMAL HIGH (ref 0.44–1.00)
GFR, Estimated: 42 mL/min — ABNORMAL LOW (ref >60)
Glucose, Bld: 404 mg/dL — ABNORMAL HIGH (ref 70–99)
Potassium: 4.5 mmol/L (ref 3.5–5.1)
Sodium: 135 mmol/L (ref 135–145)
Total Bilirubin: 0.7 mg/dL (ref 0.3–1.2)
Total Protein: UNDETERMINED g/dL (ref 6.5–8.1)

## 2022-05-29 LAB — CBC WITH DIFFERENTIAL/PLATELET
Abs Immature Granulocytes: 0.1 10*3/uL — ABNORMAL HIGH (ref 0.00–0.07)
Basophils Absolute: 0 10*3/uL (ref 0.0–0.1)
Basophils Relative: 0 %
Eosinophils Absolute: 0.3 10*3/uL (ref 0.0–0.5)
Eosinophils Relative: 2 %
HCT: 30.3 % — ABNORMAL LOW (ref 36.0–46.0)
Hemoglobin: 11.1 g/dL — ABNORMAL LOW (ref 12.0–15.0)
Immature Granulocytes: 1 %
Lymphocytes Relative: 11 %
Lymphs Abs: 1.4 10*3/uL (ref 0.7–4.0)
MCH: 31.7 pg (ref 26.0–34.0)
MCHC: 36.6 g/dL — ABNORMAL HIGH (ref 30.0–36.0)
MCV: 86.6 fL (ref 80.0–100.0)
Monocytes Absolute: 0.5 10*3/uL (ref 0.1–1.0)
Monocytes Relative: 4 %
Neutro Abs: 10.4 10*3/uL — ABNORMAL HIGH (ref 1.7–7.7)
Neutrophils Relative %: 82 %
Platelets: 206 10*3/uL (ref 150–400)
RBC: 3.5 MIL/uL — ABNORMAL LOW (ref 3.87–5.11)
RDW: 17.1 % — ABNORMAL HIGH (ref 11.5–15.5)
WBC: 12 10*3/uL — ABNORMAL HIGH (ref 4.0–10.5)
nRBC: 0 % (ref 0.0–0.2)

## 2022-05-29 LAB — GLUCOSE, CAPILLARY
Glucose-Capillary: 294 mg/dL — ABNORMAL HIGH (ref 70–99)
Glucose-Capillary: 203 mg/dL — ABNORMAL HIGH (ref 70–99)
Glucose-Capillary: 202 mg/dL — ABNORMAL HIGH (ref 70–99)
Glucose-Capillary: 217 mg/dL — ABNORMAL HIGH (ref 70–99)
Glucose-Capillary: 197 mg/dL — ABNORMAL HIGH (ref 70–99)
Glucose-Capillary: 185 mg/dL — ABNORMAL HIGH (ref 70–99)
Glucose-Capillary: 194 mg/dL — ABNORMAL HIGH (ref 70–99)
Glucose-Capillary: 190 mg/dL — ABNORMAL HIGH (ref 70–99)

## 2022-05-29 LAB — CBC
HCT: 34.4 % — ABNORMAL LOW (ref 36.0–46.0)
Hemoglobin: 11.1 g/dL — ABNORMAL LOW (ref 12.0–15.0)
MCH: 29.1 pg (ref 26.0–34.0)
MCHC: 32.3 g/dL (ref 30.0–36.0)
MCV: 90.3 fL (ref 80.0–100.0)
Platelets: 144 10*3/uL — ABNORMAL LOW (ref 150–400)
RBC: 3.81 MIL/uL — ABNORMAL LOW (ref 3.87–5.11)
RDW: 17.2 % — ABNORMAL HIGH (ref 11.5–15.5)
WBC: 6.3 10*3/uL (ref 4.0–10.5)
nRBC: 0 % (ref 0.0–0.2)

## 2022-05-29 LAB — POCT I-STAT 7, (LYTES, BLD GAS, ICA,H+H)
Acid-base deficit: 9 mmol/L — ABNORMAL HIGH (ref 0.0–2.0)
Acid-base deficit: 9 mmol/L — ABNORMAL HIGH (ref 0.0–2.0)
Bicarbonate: 19.7 mmol/L — ABNORMAL LOW (ref 20.0–28.0)
Bicarbonate: 20.6 mmol/L (ref 20.0–28.0)
Calcium, Ion: 1.29 mmol/L (ref 1.15–1.40)
Calcium, Ion: 1.29 mmol/L (ref 1.15–1.40)
HCT: 41 % (ref 36.0–46.0)
HCT: 37 % (ref 36.0–46.0)
Hemoglobin: 13.9 g/dL (ref 12.0–15.0)
Hemoglobin: 12.6 g/dL (ref 12.0–15.0)
O2 Saturation: 98 %
O2 Saturation: 99 %
Patient temperature: 102.5
Potassium: 5.4 mmol/L — ABNORMAL HIGH (ref 3.5–5.1)
Potassium: 5.6 mmol/L — ABNORMAL HIGH (ref 3.5–5.1)
Sodium: 131 mmol/L — ABNORMAL LOW (ref 135–145)
Sodium: 131 mmol/L — ABNORMAL LOW (ref 135–145)
TCO2: 21 mmol/L — ABNORMAL LOW (ref 22–32)
TCO2: 22 mmol/L (ref 22–32)
pCO2 arterial: 58.4 mmHg — ABNORMAL HIGH (ref 32–48)
pCO2 arterial: 58 mmHg — ABNORMAL HIGH (ref 32–48)
pH, Arterial: 7.147 — CL (ref 7.35–7.45)
pH, Arterial: 7.158 — CL (ref 7.35–7.45)
pO2, Arterial: 156 mmHg — ABNORMAL HIGH (ref 83–108)
pO2, Arterial: 170 mmHg — ABNORMAL HIGH (ref 83–108)

## 2022-05-29 LAB — BETA-HYDROXYBUTYRIC ACID
Beta-Hydroxybutyric Acid: <0.05 mmol/L — ABNORMAL LOW (ref 0.05–0.27)
Beta-Hydroxybutyric Acid: 0.13 mmol/L (ref 0.05–0.27)

## 2022-05-29 LAB — CBG MONITORING, ED
Glucose-Capillary: 420 mg/dL — ABNORMAL HIGH (ref 70–99)
Glucose-Capillary: 416 mg/dL — ABNORMAL HIGH (ref 70–99)

## 2022-05-29 LAB — MRSA NEXT GEN BY PCR, NASAL: MRSA by PCR Next Gen: NOT DETECTED

## 2022-05-29 LAB — LIPASE, BLOOD: Lipase: 76 U/L — ABNORMAL HIGH (ref 11–51)

## 2022-05-29 LAB — TSH: TSH: 5.284 u[IU]/mL — ABNORMAL HIGH (ref 0.350–4.500)

## 2022-05-29 LAB — TRIGLYCERIDES: Triglycerides: >5000 mg/dL — ABNORMAL HIGH (ref <150)

## 2022-05-29 MED ORDER — ASPIRIN 81 MG PO TBEC
81.0000 mg | DELAYED_RELEASE_TABLET | Freq: Every day | ORAL | Status: DC
  Administered 2022-05-30 – 2022-06-12 (×14): 81 mg via ORAL
  Filled 2022-05-29 (×14): qty 1

## 2022-05-29 MED ORDER — ROSUVASTATIN CALCIUM 20 MG PO TABS
40.0000 mg | ORAL_TABLET | Freq: Every day | ORAL | Status: DC
  Administered 2022-05-30 – 2022-06-12 (×14): 40 mg via ORAL
  Filled 2022-05-29 (×14): qty 2

## 2022-05-29 MED ORDER — DEXTROSE IN LACTATED RINGERS 5 % IV SOLN
INTRAVENOUS | Status: DC
  Filled 2022-05-29: qty 1000

## 2022-05-29 MED ORDER — LORATADINE 10 MG PO TABS
10.0000 mg | ORAL_TABLET | Freq: Every day | ORAL | Status: DC
  Administered 2022-05-30 – 2022-06-12 (×14): 10 mg via ORAL
  Filled 2022-05-29 (×15): qty 1

## 2022-05-29 MED ORDER — SILDENAFIL CITRATE 20 MG PO TABS
20.0000 mg | ORAL_TABLET | Freq: Three times a day (TID) | ORAL | Status: DC
  Administered 2022-05-29 – 2022-06-03 (×16): 20 mg via ORAL
  Filled 2022-05-29 (×16): qty 1

## 2022-05-29 MED ORDER — INSULIN REGULAR(HUMAN) IN NACL 100-0.9 UT/100ML-% IV SOLN
INTRAVENOUS | Status: DC
  Administered 2022-05-29: 8.5 [IU]/h via INTRAVENOUS
  Administered 2022-05-29: 4.4 [IU]/h via INTRAVENOUS
  Administered 2022-05-30: 6 [IU]/h via INTRAVENOUS
  Filled 2022-05-29 (×4): qty 100

## 2022-05-29 MED ORDER — ACETAMINOPHEN 325 MG PO TABS
650.0000 mg | ORAL_TABLET | Freq: Four times a day (QID) | ORAL | Status: DC | PRN
  Administered 2022-05-31 – 2022-06-10 (×9): 650 mg via ORAL
  Filled 2022-05-29 (×10): qty 2

## 2022-05-29 MED ORDER — ALBUTEROL SULFATE HFA 108 (90 BASE) MCG/ACT IN AERS: 1.0000 | INHALATION_SPRAY | Freq: Four times a day (QID) | RESPIRATORY_TRACT | Status: DC | PRN

## 2022-05-29 MED ORDER — HYDROMORPHONE HCL 1 MG/ML IJ SOLN
1.0000 mg | INTRAMUSCULAR | Status: DC | PRN
  Administered 2022-05-29: 2 mg via INTRAVENOUS
  Filled 2022-05-29: qty 2

## 2022-05-29 MED ORDER — ALBUTEROL SULFATE (2.5 MG/3ML) 0.083% IN NEBU
2.5000 mg | INHALATION_SOLUTION | Freq: Four times a day (QID) | RESPIRATORY_TRACT | Status: DC | PRN
  Administered 2022-06-01: 2.5 mg via RESPIRATORY_TRACT
  Filled 2022-05-29: qty 3

## 2022-05-29 MED ORDER — LEVOTHYROXINE SODIUM 75 MCG PO TABS
125.0000 ug | ORAL_TABLET | Freq: Every day | ORAL | Status: DC
  Administered 2022-05-30 – 2022-06-12 (×14): 125 ug via ORAL
  Filled 2022-05-29 (×14): qty 1

## 2022-05-29 MED ORDER — HEPARIN SODIUM (PORCINE) 5000 UNIT/ML IJ SOLN
5000.0000 [IU] | Freq: Three times a day (TID) | INTRAMUSCULAR | Status: DC
  Administered 2022-05-29 – 2022-06-02 (×12): 5000 [IU] via SUBCUTANEOUS
  Filled 2022-05-29 (×11): qty 1

## 2022-05-29 MED ORDER — POTASSIUM CHLORIDE 10 MEQ/100ML IV SOLN
10.0000 meq | INTRAVENOUS | Status: AC
  Administered 2022-05-29 (×2): 10 meq via INTRAVENOUS
  Filled 2022-05-29 (×2): qty 100

## 2022-05-29 MED ORDER — PANTOPRAZOLE SODIUM 40 MG PO TBEC
40.0000 mg | DELAYED_RELEASE_TABLET | Freq: Every day | ORAL | Status: DC
  Administered 2022-05-30 – 2022-06-12 (×14): 40 mg via ORAL
  Filled 2022-05-29 (×14): qty 1

## 2022-05-29 MED ORDER — HYDROMORPHONE HCL 1 MG/ML IJ SOLN
1.0000 mg | INTRAMUSCULAR | Status: DC | PRN
  Administered 2022-05-29: 1 mg via INTRAVENOUS
  Filled 2022-05-29: qty 1

## 2022-05-29 MED ORDER — LEVOCETIRIZINE DIHYDROCHLORIDE 5 MG PO TABS: 5.0000 mg | ORAL_TABLET | Freq: Every day | ORAL | Status: DC

## 2022-05-29 MED ORDER — IOHEXOL 300 MG/ML  SOLN
100.0000 mL | Freq: Once | INTRAMUSCULAR | Status: AC | PRN
  Administered 2022-05-29: 60 mL via INTRAVENOUS

## 2022-05-29 MED ORDER — PANCRELIPASE (LIP-PROT-AMYL) 12000-38000 UNITS PO CPEP
36000.0000 [IU] | ORAL_CAPSULE | Freq: Three times a day (TID) | ORAL | Status: DC
  Administered 2022-05-31 – 2022-06-12 (×36): 36000 [IU] via ORAL
  Filled 2022-05-29 (×18): qty 3
  Filled 2022-05-29 (×3): qty 1
  Filled 2022-05-29 (×13): qty 3
  Filled 2022-05-29: qty 1
  Filled 2022-05-29 (×3): qty 3
  Filled 2022-05-29: qty 1
  Filled 2022-05-29 (×8): qty 3

## 2022-05-29 MED ORDER — DULOXETINE HCL 30 MG PO CPEP
30.0000 mg | ORAL_CAPSULE | Freq: Every day | ORAL | Status: DC
  Administered 2022-05-30 – 2022-06-11 (×13): 30 mg via ORAL
  Filled 2022-05-29 (×15): qty 1

## 2022-05-29 MED ORDER — DOCUSATE SODIUM 100 MG PO CAPS: 100.0000 mg | ORAL_CAPSULE | Freq: Two times a day (BID) | ORAL | Status: DC | PRN

## 2022-05-29 MED ORDER — HYDROMORPHONE HCL 1 MG/ML IJ SOLN
1.0000 mg | Freq: Once | INTRAMUSCULAR | Status: AC
  Administered 2022-05-29: 1 mg via INTRAVENOUS
  Filled 2022-05-29: qty 1

## 2022-05-29 MED ORDER — LACTATED RINGERS IV BOLUS
1000.0000 mL | Freq: Once | INTRAVENOUS | Status: AC
  Administered 2022-05-29: 1000 mL via INTRAVENOUS

## 2022-05-29 MED ORDER — POLYETHYLENE GLYCOL 3350 17 G PO PACK: 17.0000 g | PACK | Freq: Every day | ORAL | Status: DC | PRN

## 2022-05-29 MED ORDER — DEXTROSE 50 % IV SOLN
0.0000 mL | INTRAVENOUS | Status: DC | PRN
  Filled 2022-05-29: qty 50

## 2022-05-29 MED ORDER — METOCLOPRAMIDE HCL 5 MG/ML IJ SOLN
5.0000 mg | Freq: Three times a day (TID) | INTRAMUSCULAR | Status: AC
  Administered 2022-05-29 – 2022-05-30 (×3): 5 mg via INTRAVENOUS
  Filled 2022-05-29 (×3): qty 2

## 2022-05-29 MED ORDER — PREGABALIN 75 MG PO CAPS
300.0000 mg | ORAL_CAPSULE | Freq: Two times a day (BID) | ORAL | Status: DC
  Administered 2022-05-30 – 2022-06-04 (×11): 300 mg via ORAL
  Filled 2022-05-29 (×11): qty 4

## 2022-05-29 MED ORDER — CHLORHEXIDINE GLUCONATE CLOTH 2 % EX PADS
6.0000 | MEDICATED_PAD | Freq: Every day | CUTANEOUS | Status: DC
  Administered 2022-05-30 – 2022-06-12 (×15): 6 via TOPICAL

## 2022-05-29 MED ORDER — OXYCODONE HCL 5 MG PO TABS
5.0000 mg | ORAL_TABLET | ORAL | Status: DC | PRN
  Administered 2022-05-29: 10 mg via ORAL
  Filled 2022-05-29: qty 2

## 2022-05-29 MED ORDER — SODIUM BICARBONATE 650 MG PO TABS
650.0000 mg | ORAL_TABLET | Freq: Two times a day (BID) | ORAL | Status: DC
  Administered 2022-05-29 – 2022-06-12 (×28): 650 mg via ORAL
  Filled 2022-05-29 (×27): qty 1

## 2022-05-29 MED ORDER — NORETHIN ACE-ETH ESTRAD-FE 1-20 MG-MCG PO TABS: 1.0000 | ORAL_TABLET | Freq: Every day | ORAL | Status: DC

## 2022-05-29 MED ORDER — GUAIFENESIN-DM 100-10 MG/5ML PO SYRP: 10.0000 mL | ORAL_SOLUTION | Freq: Four times a day (QID) | ORAL | Status: DC | PRN

## 2022-05-29 MED ORDER — PROCHLORPERAZINE EDISYLATE 10 MG/2ML IJ SOLN
10.0000 mg | Freq: Once | INTRAMUSCULAR | Status: AC
  Administered 2022-05-29: 10 mg via INTRAVENOUS
  Filled 2022-05-29: qty 2

## 2022-05-29 MED ORDER — IVABRADINE HCL 7.5 MG PO TABS
7.5000 mg | ORAL_TABLET | Freq: Two times a day (BID) | ORAL | Status: DC
  Administered 2022-05-30 – 2022-05-31 (×3): 7.5 mg via ORAL
  Filled 2022-05-29 (×5): qty 1

## 2022-05-29 MED ORDER — ONDANSETRON HCL 4 MG/2ML IJ SOLN: 4.0000 mg | Freq: Once | INTRAMUSCULAR | Status: DC

## 2022-05-29 MED ORDER — ONDANSETRON HCL 4 MG/2ML IJ SOLN
4.0000 mg | Freq: Four times a day (QID) | INTRAMUSCULAR | Status: DC | PRN
  Administered 2022-05-30 – 2022-06-11 (×4): 4 mg via INTRAVENOUS
  Filled 2022-05-29 (×4): qty 2

## 2022-05-29 MED ORDER — FENOFIBRATE 160 MG PO TABS
160.0000 mg | ORAL_TABLET | Freq: Every day | ORAL | Status: DC
  Administered 2022-05-30 – 2022-06-12 (×14): 160 mg via ORAL
  Filled 2022-05-29 (×14): qty 1

## 2022-05-29 MED ORDER — SODIUM CHLORIDE 0.9 % IV SOLN
1.0000 g | Freq: Once | INTRAVENOUS | Status: AC
  Administered 2022-05-29: 1 g via INTRAVENOUS
  Filled 2022-05-29: qty 10

## 2022-05-29 MED ORDER — LACTATED RINGERS IV SOLN: INTRAVENOUS | Status: DC

## 2022-05-29 NOTE — ED Notes (Signed)
[name] a very difficult stick; therefore, plan to obtain labs at [facility]. I have just given report to [place], Therapist, sports at [facility]. She leaves with [facility] at this time without incident.

## 2022-05-29 NOTE — H&P (Signed)
NAME:  Jennifer [name], MRN:  [id number], DOB:  11/15/1990, LOS: 0 ADMISSION DATE:  05/29/2022, CONSULTATION DATE:  03/03/204 REFERRING MD:  ED, CHIEF COMPLAINT:  pancreatitis, DMII   History of Present Illness:  This is a 32 year old female, past medical history of astrocytoma, lipoprotein deficiency, pancreatitis, hypercholesterolemia, hyper triglyceridemia, pulmonary hypertension status post CardioMEMS implantation, on Corlanor and sildenafil.  Patient's endocrinologist is at [facility].  She presents to the hospital with abdominal pain and nausea vomiting. Patient was found to have a anion gap at acidosis consistent with DKA and hyperglycemia.  Was also found to have a triglyceride level greater than 5000.  Concern for ongoing pancreatitis.  Patient had CT of the abdomen which was read as normal, no evidence of pancreatitis.  Pertinent  Medical History   Past Medical History:  Diagnosis Date   Brain tumor ([place]) 03/29/1995   astrocytoma   CHF (congestive heart failure) (HCC)    Cholesterosis    DM (diabetes mellitus) ([place]) 10/10/2018   Fatty liver    HTN (hypertension) 10/10/2018   Hypothyroidism 10/10/2018   Lipoprotein deficiency    Lung disease    longevity long term   Pancreatitis    Polycystic ovary syndrome      Significant Hospital Events: Including procedures, antibiotic start and stop dates in addition to other pertinent events     Interim History / Subjective:  Abdominal pain, nausea vomiting  Objective   Blood pressure (!) 144/107, pulse (!) 142, temperature 98.8 F (37.1 C), temperature source Oral, resp. rate (!) 27, height '4\' 9"'$  (1.448 m), weight 57.2 kg, last menstrual period 04/23/2022, SpO2 99 %.        Intake/Output Summary (Last 24 hours) at 05/29/2022 1402 Last data filed at 05/29/2022 1300 Gross per 24 hour  Intake 1395.83 ml  Output --  Net 1395.83 ml   Filed Weights   05/29/22 0628  Weight: 57.2 kg    Examination: General: Female, appears older  than stated age HENT: NCAT, right ptosis from previous brain surgery Lungs: Clear to auscultation bilaterally no crackles no wheeze Cardiovascular: Tachycardic, sinus, S1-S2 Abdomen: Abdominal distention Extremities: No significant edema Neuro: Alert oriented following commands GU: Deferred  Resolved Hospital Problem list     Assessment & Plan:   Diabetic ketoacidosis, type 2 diabetes with hyperglycemia Anion gap metabolic acidosis secondary to above Possible pancreatitis, hypertriglyceridemia Nausea vomit abdominal pain Plan: Continue IV fluid resuscitation Insulin protocol, diabetic ketoacidosis protocol, watch potassium Keep n.p.o. for the time being Restart fenofibrate and atorvastatin Holding home Repatha As needed Zofran, standing Reglan with a history of gastroparesis Repeat EKG in a.m. Recheck triglycerides in a.m. if there is evidence of worsening endorgan dysfunction from hyper triglyceridemia can consider nephrology consultation for [place]? But data is lacking   Pulmonary hypertension Plan: Restart Corlanor and sildenafil  Baseline CKD 3B Plan: Follow urine output, electrolytes  Acute liver injury, has chronic hepatosplenomegaly Plan: Continue to observe with repeat labs  Hypothyroid Continue home Synthroid, recheck TSH.  Best Practice (right click and "Reselect all SmartList Selections" daily)   Diet/type: NPO DVT prophylaxis: prophylactic heparin  GI prophylaxis: PPI Lines: N/A Foley:  N/A Code Status:  full code Last date of multidisciplinary goals of care discussion [spoke with patient]  Labs   CBC: Recent Labs  Lab 05/29/22 0715  WBC 12.0*  NEUTROABS 10.4*  HGB 11.1*  HCT 30.3*  MCV 86.6  PLT 99991111    Basic Metabolic Panel: Recent Labs  Lab 05/29/22 0715  NA  135  K 4.5  CL 98  CO2 19*  GLUCOSE 404*  BUN 40*  CREATININE 1.65*  CALCIUM 9.3   GFR: Estimated Creatinine Clearance: 35.9 mL/min (A) (by C-G formula based on SCr of  1.65 mg/dL (H)). Recent Labs  Lab 05/29/22 0715  WBC 12.0*    Liver Function Tests: Recent Labs  Lab 05/29/22 0715  AST 149*  ALT 95*  ALKPHOS 208*  BILITOT 0.7  PROT UNABLE TO PERFORM DUE TO LIPEMIC INTERFERENCE  ALBUMIN 3.5   Recent Labs  Lab 05/29/22 0715  LIPASE 76*   No results for input(s): "AMMONIA" in the last 168 hours.  ABG    Component Value Date/Time   PHART 7.410 12/03/2020 0819   PCO2ART 39.7 12/03/2020 0819   PO2ART 85 12/03/2020 0819   HCO3 28.2 (H) 12/06/2021 0807   HCO3 27.9 12/06/2021 0807   TCO2 30 12/06/2021 0807   TCO2 30 12/06/2021 0807   ACIDBASEDEF 3.0 (H) 08/05/2021 1007   O2SAT 54 12/06/2021 0807   O2SAT 51 12/06/2021 0807     Coagulation Profile: No results for input(s): "INR", "PROTIME" in the last 168 hours.  Cardiac Enzymes: No results for input(s): "CKTOTAL", "CKMB", "CKMBINDEX", "TROPONINI" in the last 168 hours.  HbA1C: Hgb A1c MFr Bld  Date/Time Value Ref Range Status  02/13/2022 06:19 AM 8.5 (H) 4.8 - 5.6 % Final    Comment:    (NOTE) Pre diabetes:          5.7%-6.4%  Diabetes:              >6.4%  Glycemic control for   <7.0% adults with diabetes   08/15/2021 05:56 AM 7.9 (H) 4.8 - 5.6 % Final    Comment:    (NOTE) Pre diabetes:          5.7%-6.4%  Diabetes:              >6.4%  Glycemic control for   <7.0% adults with diabetes     CBG: Recent Labs  Lab 05/29/22 1057 05/29/22 1152  GLUCAP 420* 416*    Review of Systems:   Review of Systems  Constitutional:  Negative for chills, fever, malaise/fatigue and weight loss.  HENT:  Negative for hearing loss, sore throat and tinnitus.   Eyes:  Negative for blurred vision and double vision.  Respiratory:  Negative for cough, hemoptysis, sputum production, shortness of breath, wheezing and stridor.   Cardiovascular:  Negative for chest pain, palpitations, orthopnea, leg swelling and PND.  Gastrointestinal:  Positive for abdominal pain, nausea and vomiting.  Negative for constipation, diarrhea and heartburn.  Genitourinary:  Negative for dysuria, hematuria and urgency.  Musculoskeletal:  Negative for joint pain and myalgias.  Skin:  Negative for itching and rash.  Neurological:  Negative for dizziness, tingling, weakness and headaches.  Endo/Heme/Allergies:  Negative for environmental allergies. Does not bruise/bleed easily.  Psychiatric/Behavioral:  Negative for depression. The patient is not nervous/anxious and does not have insomnia.   All other systems reviewed and are negative.    Past Medical History:  She,  has a past medical history of Brain tumor ([place]) (03/29/1995), CHF (congestive heart failure) ([place]), Cholesterosis, DM (diabetes mellitus) ([place]) (10/10/2018), Fatty liver, HTN (hypertension) (10/10/2018), Hypothyroidism (10/10/2018), Lipoprotein deficiency, Lung disease, Pancreatitis, and Polycystic ovary syndrome.   Surgical History:   Past Surgical History:  Procedure Laterality Date   ABDOMINAL SURGERY     pt states during miscarriage got her intestine   BRAIN SURGERY  EYE MUSCLE SURGERY Right 03/28/2014   PRESSURE SENSOR/CARDIOMEMS N/A 12/06/2021   Procedure: PRESSURE SENSOR/CARDIOMEMS;  Surgeon: [name], MD;  Location: [facility];  Service: Cardiovascular;  Laterality: N/A;   RIGHT HEART CATH N/A 08/05/2021   Procedure: RIGHT HEART CATH;  Surgeon: [name], MD;  Location: [facility];  Service: Cardiovascular;  Laterality: N/A;   RIGHT HEART CATH N/A 12/06/2021   Procedure: RIGHT HEART CATH;  Surgeon: [name], MD;  Location: [facility];  Service: Cardiovascular;  Laterality: N/A;   RIGHT/LEFT HEART CATH AND CORONARY ANGIOGRAPHY N/A 12/03/2020   Procedure: RIGHT/LEFT HEART CATH AND CORONARY ANGIOGRAPHY;  Surgeon: [name], MD;  Location: [facility];  Service: Cardiovascular;  Laterality: N/A;   VENTRICULOSTOMY  03/28/1997     Social History:   reports that she  has never smoked. She has never used smokeless tobacco. She reports that she does not drink alcohol and does not use drugs.   Family History:  Her family history includes Diabetes in her father and mother; Hyperlipidemia in her mother; Hypertension in her father and mother; Pancreatic cancer in her paternal aunt and paternal uncle; Thyroid disease in her mother. There is no history of Colon cancer, Esophageal cancer, or Stomach cancer.   Allergies Allergies  Allergen Reactions   Ketamine Other (See Comments)    In vegetative state for 15 minutes per pt   Erythromycin     unk   Fentanyl Nausea And Vomiting   Maitake Mushroom [Maitake] Itching    Itchy throat   Penicillin V Itching    Gi upset   Shellfish Allergy Other (See Comments)    Pt has never had shellfish but tested positive on allergy test. Pt states contrast in CT is okay   Morphine Itching and Rash   Penicillins Rash    Has patient had a PCN reaction causing immediate rash, facial/tongue/throat swelling, SOB or lightheadedness with hypotension: Y Has patient had a PCN reaction causing severe rash involving mucus membranes or skin necrosis: Y Has patient had a PCN reaction that required hospitalization: N Has patient had a PCN reaction occurring within the last 10 years: Y If all of the above answers are "NO", then may proceed with Cephalosporin use.    Prednisone Rash     Home Medications  Prior to Admission medications   Medication Sig Start Date End Date Taking? Authorizing Provider  albuterol (VENTOLIN HFA) 108 (90 Base) MCG/ACT inhaler Inhale 1-2 puffs into the lungs every 6 (six) hours as needed for wheezing or shortness of breath. 12/31/18   [provider]  aspirin EC 81 MG EC tablet Take 1 tablet (81 mg total) by mouth daily. Swallow whole. 12/07/20   Ezekiel Slocumb, DO  carvedilol (COREG) 3.125 MG tablet Take 1 tablet (3.125 mg total) by mouth 2 (two) times daily with a meal. 02/13/22 04/05/22  Donne Hazel, MD  ciprofloxacin (CIPRO) 500 MG tablet Take 500 mg by mouth 2 (two) times daily.    [provider]  CREON 36000-114000 units CPEP capsule Take 36,000 Units by mouth 3 (three) times daily.    [provider]  DULoxetine (CYMBALTA) 30 MG capsule Take 30 mg by mouth at bedtime. 01/28/19 01/27/23  [provider]  ergocalciferol (VITAMIN D2) 1.25 MG (50000 UT) capsule Take 50,000 Units by mouth once a week.    [provider]  Evolocumab (REPATHA) 140 MG/ML SOSY Inject 140 mg into  the skin See admin instructions. Inject 140 mg subcutaneously every other Sunday evening    [provider]  fenofibrate 160 MG tablet Take 1 tablet (160 mg total) by mouth daily. 12/07/20   Ezekiel Slocumb, DO  guaiFENesin-dextromethorphan (ROBITUSSIN DM) 100-10 MG/5ML syrup Take 10 mLs by mouth every 6 (six) hours as needed for cough. 03/25/22   Regalado, Belkys A, MD  Insulin Pen Needle 32G X 4 MM MISC 1 Device by Does not apply route QID. For use with insulin pens 02/13/22   Donne Hazel, MD  Insulin Regular Human (HUMULIN R U-500 KWIKPEN [place]) Inject 100-120 Units into the skin See admin instructions. 290 units at every meal and 100 units at bedtime.    [provider]  ivabradine (CORLANOR) 7.5 MG TABS tablet Take 1 tablet (7.5 mg total) by mouth 2 (two) times daily with a meal. 08/02/21   [name], Shaune Pascal, MD  levocetirizine (XYZAL) 5 MG tablet Take 5 mg by mouth daily. 03/05/22   [provider]  levothyroxine (SYNTHROID) 125 MCG tablet Take 1 tablet (125 mcg total) by mouth daily at 6 (six) AM. 12/07/20   Ezekiel Slocumb, DO  norethindrone-ethinyl estradiol-FE ([name] 1/20) 1-20 MG-MCG tablet Take 1 tablet by mouth daily.    [provider]  pantoprazole (PROTONIX) 40 MG tablet Take 40 mg by mouth daily. 12/24/19   [provider]  pregabalin (LYRICA) 150 MG capsule Take 300 mg by mouth at bedtime.    [provider]  prochlorperazine (COMPAZINE) 5 MG tablet Take 5 mg by mouth daily.    [provider]  rosuvastatin (CRESTOR) 40 MG tablet Take 40 mg by mouth daily. 08/09/21   [provider]  sildenafil (REVATIO) 20 MG tablet Take 1 tablet (20 mg total) by mouth 3 (three) times daily. 05/11/22   [name], Shaune Pascal, MD  sodium bicarbonate 650 MG tablet Take 650 mg by mouth 2 (two) times daily.    [provider]  Torsemide 40 MG TABS Take 60 mg by mouth daily. 04/26/22   Clegg, [name] D, NP  tretinoin (RETIN-A) 0.05 % cream Apply 1 application topically at bedtime. 01/17/21   [provider]     This patient is critically ill with multiple organ system failure; which, requires frequent high complexity decision making, assessment, support, evaluation, and titration of therapies. This was completed through the application of advanced monitoring technologies and extensive interpretation of multiple databases. During this encounter critical care time was devoted to patient care services described in this note for 32 minutes.  Garner Nash, DO [facility] 05/29/2022 2:13 PM

## 2022-05-29 NOTE — ED Provider Notes (Signed)
  Physical Exam  BP (!) 149/98   Pulse (!) 120   Temp 98 F (36.7 C) (Oral)   Resp 20   Ht '4\' 9"'$  (1.448 m)   Wt 57.2 kg   LMP 04/23/2022   SpO2 93%   BMI 27.27 kg/m     Procedures  .Critical Care  Performed by: Regan Lemming, MD Authorized by: Regan Lemming, MD   Critical care provider statement:    Critical care time (minutes):  65   Critical care was necessary to treat or prevent imminent or life-threatening deterioration of the following conditions:  Endocrine crisis   Critical care was time spent personally by me on the following activities:  Development of treatment plan with patient or surrogate, discussions with consultants, evaluation of patient's response to treatment, examination of patient, ordering and review of laboratory studies, ordering and review of radiographic studies, ordering and performing treatments and interventions, pulse oximetry, re-evaluation of patient's condition and review of old charts   Care discussed with: admitting provider     ED Course / MDM   Clinical Course as of 05/29/22 1040  Sun May 29, 2022  1010 WBC(!): 12.0 [JL]  1010 Leukocytes,Ua(!): LARGE [JL]  1010 WBC, UA: >50 [JL]  1010 Bacteria, UA(!): MANY [JL]  1036 Triglycerides(!): >5,000 [JL]    Clinical Course User Index [JL] Regan Lemming, MD   Medical Decision Making Amount and/or Complexity of Data Reviewed Labs: ordered. Decision-making details documented in ED Course. Radiology: ordered. ECG/medicine tests: ordered.  Risk Prescription drug management. Decision regarding hospitalization.   36F, presenting with abdominal pain, concern for pancreatitis flare.   The patient was administered 2 L of IV fluids for volume resuscitation.  She was administered Dilaudid for pain control.  Her laboratory evaluation was concerning for hypertriglyceridemia with a triglyceride level greater than 5000, lipase elevated to 76, CBC with leukocytosis to 12, anemia to 11.1, CMP with an  elevated serum creatinine at the patient's baseline at 1.65, hyperglycemia with concern for developing DKA with a blood glucose of 404, bicarbonate of 19, anion gap of 18, elevated LFTs with an AST of 149, ALT of 95, alkaline phosphatase at 208, T. bili normal at 0.7, potassium normal at 4.5, urine pregnancy negative, urinalysis with evidence of persistent UTI with large leukocytes, greater than 50 WBCs and many bacteria present.  The patient had been treated for UTI with ciprofloxacin outpatient.  1 g of IV Rocephin was administered for persistent UTI.  CT abdomen pelvis with contrast was ordered.  The patient was started on insulin drip for hypertriglyceridemia and concern for developing DKA.  Pulmonary critical care was consulted for admission to the ICU.  CT Abdomen Pelvis: IMPRESSION:  1. No acute abdominopelvic process.  2. Hepatosplenomegaly. Additional incidental, chronic and senescent  findings as above.    The patient was subsequent admitted to the ICU in critical condition.       Regan Lemming, MD 05/29/22 1236

## 2022-05-29 NOTE — ED Provider Notes (Signed)
[facility] Provider Note: Georgena Spurling, MD, FACEP  CSN: [id number] MRN: [id number] ARRIVAL: 05/29/22 at [place]: [id number]   CHIEF COMPLAINT  Abdominal Pain   HISTORY OF PRESENT ILLNESS  05/29/22 6:40 AM Jennifer [name] is a 32 y.o. female with multiple medical problems.  [name] here with epigastric abdominal pain that began about 3 AM today.  She describes the pain as like previous episodes of pancreatitis and rates it as a 9 out of 10.  It is worse with movement and palpation.  She has vomited 3 times with this.  The emesis consist primarily of food. She was noted to be tachycardic on arrival but has a history of resting tachycardia.  She was recently treated with clindamycin for vulvar cellulitis per [facility] note from 05/19/2022.   Past Medical History:  Diagnosis Date   Brain tumor ([place]) 03/29/1995   astrocytoma   CHF (congestive heart failure) (HCC)    Cholesterosis    DM (diabetes mellitus) ([place]) 10/10/2018   Fatty liver    HTN (hypertension) 10/10/2018   Hypothyroidism 10/10/2018   Lipoprotein deficiency    Lung disease    longevity long term   Pancreatitis    Polycystic ovary syndrome     Past Surgical History:  Procedure Laterality Date   ABDOMINAL SURGERY     pt states during miscarriage got her intestine   BRAIN SURGERY     EYE MUSCLE SURGERY Right 03/28/2014   PRESSURE SENSOR/CARDIOMEMS N/A 12/06/2021   Procedure: PRESSURE SENSOR/CARDIOMEMS;  Surgeon: [name], MD;  Location: [facility];  Service: Cardiovascular;  Laterality: N/A;   RIGHT HEART CATH N/A 08/05/2021   Procedure: RIGHT HEART CATH;  Surgeon: [name], MD;  Location: [facility];  Service: Cardiovascular;  Laterality: N/A;   RIGHT HEART CATH N/A 12/06/2021   Procedure: RIGHT HEART CATH;  Surgeon: [name], MD;  Location: [facility] [place] [facility];  Service: Cardiovascular;  Laterality: N/A;   RIGHT/LEFT HEART CATH AND CORONARY ANGIOGRAPHY  N/A 12/03/2020   Procedure: RIGHT/LEFT HEART CATH AND CORONARY ANGIOGRAPHY;  Surgeon: [name], MD;  Location: [facility];  Service: Cardiovascular;  Laterality: N/A;   VENTRICULOSTOMY  03/28/1997    Family History  Problem Relation Age of Onset   Diabetes Mother    Hypertension Mother    Hyperlipidemia Mother    Thyroid disease Mother    Hypertension Father    Diabetes Father    Pancreatic cancer Paternal Aunt    Pancreatic cancer Paternal Uncle    Colon cancer Neg Hx    Esophageal cancer Neg Hx    Stomach cancer Neg Hx     Social History   Tobacco Use   Smoking status: Never   Smokeless tobacco: Never  Vaping Use   Vaping Use: Never used  Substance Use Topics   Alcohol use: Never   Drug use: Never    Prior to Admission medications   Medication Sig Start Date End Date Taking? Authorizing Provider  albuterol (VENTOLIN HFA) 108 (90 Base) MCG/ACT inhaler Inhale 1-2 puffs into the lungs every 6 (six) hours as needed for wheezing or shortness of breath. 12/31/18   [provider]  aspirin EC 81 MG EC tablet Take 1 tablet (81 mg total) by mouth daily. Swallow whole. 12/07/20   Ezekiel Slocumb, DO  carvedilol (COREG) 3.125 MG tablet Take 1 tablet (3.125 mg total) by mouth 2 (two) times daily with a meal. 02/13/22  04/05/22  Donne Hazel, MD  ciprofloxacin (CIPRO) 500 MG tablet Take 500 mg by mouth 2 (two) times daily.    [provider]  CREON 36000-114000 units CPEP capsule Take 36,000 Units by mouth 3 (three) times daily.    [provider]  DULoxetine (CYMBALTA) 30 MG capsule Take 30 mg by mouth at bedtime. 01/28/19 01/27/23  [provider]  ergocalciferol (VITAMIN D2) 1.25 MG (50000 UT) capsule Take 50,000 Units by mouth once a week.    [provider]  Evolocumab (REPATHA) 140 MG/ML SOSY Inject 140 mg into the skin See admin instructions. Inject 140 mg subcutaneously every other Sunday evening    [provider]   fenofibrate 160 MG tablet Take 1 tablet (160 mg total) by mouth daily. 12/07/20   Ezekiel Slocumb, DO  guaiFENesin-dextromethorphan (ROBITUSSIN DM) 100-10 MG/5ML syrup Take 10 mLs by mouth every 6 (six) hours as needed for cough. 03/25/22   Regalado, Belkys A, MD  Insulin Pen Needle 32G X 4 MM MISC 1 Device by Does not apply route QID. For use with insulin pens 02/13/22   Donne Hazel, MD  Insulin Regular Human (HUMULIN R U-500 KWIKPEN [place]) Inject 100-120 Units into the skin See admin instructions. 290 units at every meal and 100 units at bedtime.    [provider]  ivabradine (CORLANOR) 7.5 MG TABS tablet Take 1 tablet (7.5 mg total) by mouth 2 (two) times daily with a meal. 08/02/21   [name], Shaune Pascal, MD  levocetirizine (XYZAL) 5 MG tablet Take 5 mg by mouth daily. 03/05/22   [provider]  levothyroxine (SYNTHROID) 125 MCG tablet Take 1 tablet (125 mcg total) by mouth daily at 6 (six) AM. 12/07/20   Ezekiel Slocumb, DO  norethindrone-ethinyl estradiol-FE ([name] 1/20) 1-20 MG-MCG tablet Take 1 tablet by mouth daily.    [provider]  pantoprazole (PROTONIX) 40 MG tablet Take 40 mg by mouth daily. 12/24/19   [provider]  pregabalin (LYRICA) 150 MG capsule Take 300 mg by mouth at bedtime.    [provider]  prochlorperazine (COMPAZINE) 5 MG tablet Take 5 mg by mouth daily.    [provider]  rosuvastatin (CRESTOR) 40 MG tablet Take 40 mg by mouth daily. 08/09/21   [provider]  sildenafil (REVATIO) 20 MG tablet Take 1 tablet (20 mg total) by mouth 3 (three) times daily. 05/11/22   [name], Shaune Pascal, MD  sodium bicarbonate 650 MG tablet Take 650 mg by mouth 2 (two) times daily.    [provider]  Torsemide 40 MG TABS Take 60 mg by mouth daily. 04/26/22   Clegg, [name] D, NP  tretinoin (RETIN-A) 0.05 % cream Apply 1 application topically at bedtime. 01/17/21   [provider]    Allergies Ketamine,  Erythromycin, Fentanyl, Maitake mushroom [maitake], Penicillin v, Shellfish allergy, Morphine, Penicillins, and Prednisone   REVIEW OF SYSTEMS  Negative except as noted here or in the History of Present Illness.   PHYSICAL EXAMINATION  Initial Vital Signs Blood pressure (!) 149/98, pulse (!) 120, temperature 98 F (36.7 C), temperature source Oral, resp. rate 20, height '4\' 9"'$  (1.448 m), weight 57.2 kg, last menstrual period 04/23/2022, SpO2 93 %.  Examination General: Well-developed, well-nourished female in no acute distress; appearance consistent with age of record HENT: normocephalic; atraumatic Eyes: Left pupil round and reactive to light; right pupil pinpoint with right ptosis Neck: supple Heart: regular rate and rhythm; tachycardia Lungs:  clear to auscultation bilaterally Abdomen: soft; nondistended; epigastric tenderness; bowel sounds present Extremities: No deformity; full range of motion; pulses normal Neurologic: Awake, alert and oriented; motor function intact in all extremities and symmetric; no facial droop Skin: Warm and dry; facial hirsutism Psychiatric: Normal mood and affect   RESULTS  Summary of this visit's results, reviewed and interpreted by myself:   EKG Interpretation  Date/Time:  Sunday May 29 2022 06:51:41 EST Ventricular Rate:  119 PR Interval:  126 QRS Duration: 90 QT Interval:  344 QTC Calculation: 484 R Axis:   28 Text Interpretation: Sinus tachycardia Nonspecific T abnormalities, lateral leads Borderline prolonged QT interval Rate is faster Confirmed by Shanon Rosser [phone]) on 05/29/2022 6:53:29 AM       Laboratory Studies: No results found for this or any previous visit (from the past 24 hour(s)). Imaging Studies: No results found.  ED COURSE and MDM  Nursing notes, initial and subsequent vitals signs, including pulse oximetry, reviewed and interpreted by myself.  Vitals:   05/29/22 0628 05/29/22 0633  BP:  (!) 149/98  Pulse:  (!)  120  Resp:  20  Temp:  98 F (36.7 C)  TempSrc:  Oral  SpO2:  93%  Weight: 57.2 kg   Height: '4\' 9"'$  (1.448 m)    Medications  prochlorperazine (COMPAZINE) injection 10 mg (has no administration in time range)  HYDROmorphone (DILAUDID) injection 1 mg (has no administration in time range)   6:57 AM Compazine and Dilaudid ordered for control the patient's nausea and pain.  [name] known to tolerate these drugs and Compazine has less QT prolonging risk than other antiemetics.  We will avoid an IV fluid bolus at this time given the patient's known congestive heart failure.  She states she did not lose a lot of fluid vomiting this morning as it was mostly undigested food.   7:02 AM Signed out to Dr. Armandina Gemma. Laboratory studies pending.    PROCEDURES  Procedures   ED DIAGNOSES     ICD-10-CM   1. Epigastric pain  R10.13     2. Nausea and vomiting in adult  R11.2          Shanon Rosser, MD 05/29/22 [phone]

## 2022-05-29 NOTE — ED Triage Notes (Addendum)
C/o upper abd pain that started at 3am. Concerned she may have pancreatitis. C/o vomiting times 3. Has not taken anything for pain. Denies any fevers. C/o diarrhea.  States she is currently being treated for UTI/yeast infections.

## 2022-05-29 NOTE — ED Notes (Signed)
Called [facility] -- informed that the patient patient Bed Assignment is Ready

## 2022-05-29 NOTE — Progress Notes (Signed)
Per provider Icard, give 1L bolus of LR @ 161m/hr NOW. Continue correcting DKA via insulin gtt and fluids. Pt. anuric since 2H admission; provider aware. RN to bladder scan pt. again at 2200. If pt. still has RASS -2 or greater, RN to place foley for acute urinary retention.

## 2022-05-30 DIAGNOSIS — E1110 Type 2 diabetes mellitus with ketoacidosis without coma: Principal | ICD-10-CM

## 2022-05-30 LAB — HEPATIC FUNCTION PANEL
ALT: 52 U/L — ABNORMAL HIGH (ref 0–44)
AST: 37 U/L (ref 15–41)
Albumin: 2.6 g/dL — ABNORMAL LOW (ref 3.5–5.0)
Alkaline Phosphatase: 151 U/L — ABNORMAL HIGH (ref 38–126)
Bilirubin, Direct: 0.2 mg/dL (ref 0.0–0.2)
Indirect Bilirubin: 0.2 mg/dL — ABNORMAL LOW (ref 0.3–0.9)
Total Bilirubin: 0.4 mg/dL (ref 0.3–1.2)
Total Protein: 5.7 g/dL — ABNORMAL LOW (ref 6.5–8.1)

## 2022-05-30 LAB — GLUCOSE, CAPILLARY
Glucose-Capillary: 165 mg/dL — ABNORMAL HIGH (ref 70–99)
Glucose-Capillary: 167 mg/dL — ABNORMAL HIGH (ref 70–99)
Glucose-Capillary: 170 mg/dL — ABNORMAL HIGH (ref 70–99)
Glucose-Capillary: 172 mg/dL — ABNORMAL HIGH (ref 70–99)
Glucose-Capillary: 174 mg/dL — ABNORMAL HIGH (ref 70–99)
Glucose-Capillary: 177 mg/dL — ABNORMAL HIGH (ref 70–99)
Glucose-Capillary: 178 mg/dL — ABNORMAL HIGH (ref 70–99)
Glucose-Capillary: 178 mg/dL — ABNORMAL HIGH (ref 70–99)
Glucose-Capillary: 180 mg/dL — ABNORMAL HIGH (ref 70–99)
Glucose-Capillary: 187 mg/dL — ABNORMAL HIGH (ref 70–99)
Glucose-Capillary: 189 mg/dL — ABNORMAL HIGH (ref 70–99)
Glucose-Capillary: 193 mg/dL — ABNORMAL HIGH (ref 70–99)
Glucose-Capillary: 215 mg/dL — ABNORMAL HIGH (ref 70–99)
Glucose-Capillary: 215 mg/dL — ABNORMAL HIGH (ref 70–99)
Glucose-Capillary: 217 mg/dL — ABNORMAL HIGH (ref 70–99)
Glucose-Capillary: 226 mg/dL — ABNORMAL HIGH (ref 70–99)
Glucose-Capillary: 232 mg/dL — ABNORMAL HIGH (ref 70–99)
Glucose-Capillary: 242 mg/dL — ABNORMAL HIGH (ref 70–99)
Glucose-Capillary: 244 mg/dL — ABNORMAL HIGH (ref 70–99)
Glucose-Capillary: 275 mg/dL — ABNORMAL HIGH (ref 70–99)
Glucose-Capillary: 279 mg/dL — ABNORMAL HIGH (ref 70–99)
Glucose-Capillary: 361 mg/dL — ABNORMAL HIGH (ref 70–99)

## 2022-05-30 LAB — BASIC METABOLIC PANEL
Anion gap: 13 (ref 5–15)
Anion gap: 15 (ref 5–15)
BUN: 36 mg/dL — ABNORMAL HIGH (ref 6–20)
BUN: 19 mg/dL (ref 6–20)
CO2: 18 mmol/L — ABNORMAL LOW (ref 22–32)
CO2: 13 mmol/L — ABNORMAL LOW (ref 22–32)
Calcium: 8.1 mg/dL — ABNORMAL LOW (ref 8.9–10.3)
Calcium: 6.6 mg/dL — ABNORMAL LOW (ref 8.9–10.3)
Chloride: 103 mmol/L (ref 98–111)
Chloride: 108 mmol/L (ref 98–111)
Creatinine, Ser: 1.61 mg/dL — ABNORMAL HIGH (ref 0.44–1.00)
Creatinine, Ser: 1.22 mg/dL — ABNORMAL HIGH (ref 0.44–1.00)
GFR, Estimated: 44 mL/min — ABNORMAL LOW (ref >60)
GFR, Estimated: >60 mL/min (ref >60)
Glucose, Bld: 313 mg/dL — ABNORMAL HIGH (ref 70–99)
Glucose, Bld: 199 mg/dL — ABNORMAL HIGH (ref 70–99)
Potassium: 4.2 mmol/L (ref 3.5–5.1)
Potassium: 3.1 mmol/L — ABNORMAL LOW (ref 3.5–5.1)
Sodium: 134 mmol/L — ABNORMAL LOW (ref 135–145)
Sodium: 136 mmol/L (ref 135–145)

## 2022-05-30 LAB — CBC
HCT: 30 % — ABNORMAL LOW (ref 36.0–46.0)
Hemoglobin: 10.4 g/dL — ABNORMAL LOW (ref 12.0–15.0)
MCH: 32.4 pg (ref 26.0–34.0)
MCHC: 34.7 g/dL (ref 30.0–36.0)
MCV: 93.5 fL (ref 80.0–100.0)
Platelets: 136 10*3/uL — ABNORMAL LOW (ref 150–400)
RBC: 3.21 MIL/uL — ABNORMAL LOW (ref 3.87–5.11)
RDW: 18.2 % — ABNORMAL HIGH (ref 11.5–15.5)
WBC: 5.6 10*3/uL (ref 4.0–10.5)
nRBC: 2.1 % — ABNORMAL HIGH (ref 0.0–0.2)

## 2022-05-30 LAB — PHOSPHORUS
Phosphorus: 1.8 mg/dL — ABNORMAL LOW (ref 2.5–4.6)
Phosphorus: 3.1 mg/dL (ref 2.5–4.6)

## 2022-05-30 LAB — BETA-HYDROXYBUTYRIC ACID: Beta-Hydroxybutyric Acid: 0.84 mmol/L — ABNORMAL HIGH (ref 0.05–0.27)

## 2022-05-30 LAB — MAGNESIUM
Magnesium: 1.4 mg/dL — ABNORMAL LOW (ref 1.7–2.4)
Magnesium: 1.9 mg/dL (ref 1.7–2.4)

## 2022-05-30 LAB — LIPASE, BLOOD: Lipase: 21 U/L (ref 11–51)

## 2022-05-30 LAB — TRIGLYCERIDES: Triglycerides: >5000 mg/dL — ABNORMAL HIGH (ref <150)

## 2022-05-30 MED ORDER — ORAL CARE MOUTH RINSE: 15.0000 mL | OROMUCOSAL | Status: DC | PRN

## 2022-05-30 MED ORDER — POTASSIUM CHLORIDE 20 MEQ PO PACK
60.0000 meq | PACK | Freq: Four times a day (QID) | ORAL | Status: AC
  Administered 2022-05-30 – 2022-05-31 (×2): 60 meq via ORAL
  Filled 2022-05-30 (×2): qty 3

## 2022-05-30 MED ORDER — MAGNESIUM SULFATE 4 GM/100ML IV SOLN
4.0000 g | Freq: Once | INTRAVENOUS | Status: AC
  Administered 2022-05-30: 4 g via INTRAVENOUS
  Filled 2022-05-30: qty 100

## 2022-05-30 MED ORDER — PROCHLORPERAZINE MALEATE 5 MG PO TABS
5.0000 mg | ORAL_TABLET | Freq: Four times a day (QID) | ORAL | Status: DC | PRN
  Filled 2022-05-30: qty 1

## 2022-05-30 MED ORDER — ALBUMIN HUMAN 5 % IV SOLN
12.5000 g | Freq: Once | INTRAVENOUS | Status: AC
  Administered 2022-05-30: 12.5 g via INTRAVENOUS
  Filled 2022-05-30: qty 250

## 2022-05-30 MED ORDER — SODIUM CHLORIDE 0.9 % IV SOLN
1.0000 g | INTRAVENOUS | Status: DC
  Administered 2022-05-30: 1 g via INTRAVENOUS
  Filled 2022-05-30 (×2): qty 10

## 2022-05-30 MED ORDER — SODIUM PHOSPHATES 45 MMOLE/15ML IV SOLN
30.0000 mmol | Freq: Once | INTRAVENOUS | Status: AC
  Administered 2022-05-30: 30 mmol via INTRAVENOUS
  Filled 2022-05-30: qty 10

## 2022-05-30 NOTE — Inpatient Diabetes Management (Signed)
Inpatient Diabetes Program Recommendations  AACE/ADA: New Consensus Statement on Inpatient Glycemic Control (2015)  Target Ranges:  Prepandial:   less than 140 mg/dL      Peak postprandial:   less than 180 mg/dL (1-2 hours)      Critically ill patients:  140 - 180 mg/dL    Latest Reference Range & Units 05/29/22 07:15  Sodium 135 - 145 mmol/L 135  Potassium 3.5 - 5.1 mmol/L 4.5  Chloride 98 - 111 mmol/L 98  CO2 22 - 32 mmol/L 19 (L)  Glucose 70 - 99 mg/dL 404 (H)  BUN 6 - 20 mg/dL 40 (H)  Creatinine 0.44 - 1.00 mg/dL 1.65 (H)  Calcium 8.9 - 10.3 mg/dL 9.3  Anion gap 5 - 15  18 (H)  (L): Data is abnormally low (H): Data is abnormally high  Latest Reference Range & Units 05/30/22 00:58 05/30/22 02:00 05/30/22 02:55 05/30/22 03:55 05/30/22 05:08 05/30/22 06:35 05/30/22 07:37 05/30/22 08:31  Glucose-Capillary 70 - 99 mg/dL 189 (H)  IV Insulin Drip Infusing 244 (H) 279 (H) 275 (H) 226 (H) 187 (H) 172 (H) 180 (H)  (H): Data is abnormally high   Admit with:  Diabetic ketoacidosis, type 2 diabetes with hyperglycemia Anion gap metabolic acidosis secondary to above Possible pancreatitis, hypertriglyceridemia Nausea vomit abdominal pain  History: DM, Pancreatitis, Astrocytoma, Lipoprotein deficiency   Home DM Meds: Humulin R U-500 Insulin 290 units TID with each meal        Humulin R U-500 Insulin 100 units QHS  Current Orders: IV Insulin Drip    MD- Note Triglycerides remain >5000 on BMET from 2:35am today  Recommend leaving pt on the IV Insulin Drip for treatment of the Triglycerides  May want to switch pt off the [employer] and use the weight based protocol for IV Insulin treatment for High Triglycerides    --Will follow patient during hospitalization--  Wyn Quaker RN, MSN, [place] Diabetes Coordinator Inpatient Glycemic Control Team Team Pager: [phone] (8a-5p)

## 2022-05-30 NOTE — Progress Notes (Signed)
[facility] Progress Note Patient Name: Jennifer [name] DOB: October 06, 1990 MRN: [id number]   Date of Service  05/30/2022  HPI/Events of Note  Patient has acute hypertriglyceridemia acute diabetic ketoacidosis with acute pancreatitis.  Currently receiving a insulin drip, IV fluids, and electrolyte replacement intermittently.  eICU Interventions  Patient has severe hypertriglyceridemia which is difficult to treat with concurrent diabetic ketoacidosis.  Continue insulin drip and IV fluids.  No acute changes are indicated.  Added KCl replacements in the setting of AKI.        Barbarita Hutmacher 05/30/2022, 10:01 PM

## 2022-05-30 NOTE — Progress Notes (Signed)
Patient has limited IV access with two PIVs. Orders for antibiotics, Albumin, and electrolyte replacement ordered. Due to compatibility issues patient needs another PIV. Two attempts by RN, one attempt by IV team, and awaiting another attempt with ultrasound guidance. MD and pharmacy aware that medications cannot be given until other IV access obtained.

## 2022-05-30 NOTE — Progress Notes (Signed)
NAME:  Jennifer [name], MRN:  [id number], DOB:  01/10/1991, LOS: 1 ADMISSION DATE:  05/29/2022, CONSULTATION DATE:  05/29/2022 REFERRING MD:  EDP, CHIEF COMPLAINT:  Pancreatitis, T2DM   History of Present Illness:  32 year old woman who presented to [facility] ED with abdominal pain, nausea and vomiting. PMHx significant for HTN, HLD, CHF (Echo 11/2021 EF 50-55%, no RWMAs, mildly elevated PASP), pulmonary HTN (s/p CardioMEMS implantation, on Corlanor and sildenafil), T2DM (followed by [facility]), NAFLD, hypothyroidism, astrocytoma, PCOS.  On presentation to ED, patient was found to have an anion gap with acidosis consistent with DKA/hyperglycemia. Was also found to have a TG > 5000.  Concern for ongoing pancreatitis.  CT A/P 3/3 without acute process, no e/o pancreatitis; +hepatosplenomegaly.  PCCM consulted for ICU admission.  Pertinent Medical History:   Past Medical History:  Diagnosis Date   Brain tumor ([place]) 03/29/1995   astrocytoma   CHF (congestive heart failure) (HCC)    Cholesterosis    DM (diabetes mellitus) ([place]) 10/10/2018   Fatty liver    HTN (hypertension) 10/10/2018   Hypothyroidism 10/10/2018   Lipoprotein deficiency    Lung disease    longevity long term   Pancreatitis    Polycystic ovary syndrome     Significant Hospital Events: Including procedures, antibiotic start and stop dates in addition to other pertinent events   3/3 - Presented to [facility] ED with abdominal pain, n/v. DKA with +AG, acidosis.  Interim History / Subjective:  Feeling slightly better today Persistent nausea, baseline per patient Feels UE/hands are somewhat swollen Multiple lab abnormalities/electrolyte derangements Home compazine added back Ongoing dysuria Ceftriaxone x additional 4 days, end 3/7 TG remain severely elevated, still on insulin gtt  Objective:  Blood pressure 134/79, pulse (!) 129, temperature 99.1 F (37.3 C), temperature source Oral, resp. rate 20, height '4\' 9"'$  (1.448  m), weight 57.2 kg, last menstrual period 04/23/2022, SpO2 99 %.        Intake/Output Summary (Last 24 hours) at 05/30/2022 0825 Last data filed at 05/30/2022 0700 Gross per 24 hour  Intake 5064.96 ml  Output 851 ml  Net 4213.96 ml    Filed Weights   05/29/22 0628  Weight: 57.2 kg   Physical Examination: General: Chronically ill-appearing young woman in NAD. HEENT: [place]/AT, anicteric sclera, R eye ptosis, moist mucous membranes. Neuro: Awake, oriented x 4. Responds to verbal stimuli. Following commands consistently. Moves all 4 extremities spontaneously.  CV: Tachycardic to 110s, regular rhythm, no m/g/r. PULM: Breathing even and unlabored on 2LNC. Lung fields CTAB. GI: Soft, mild epigastric TTP, mild distended. Hypoactive bowel sounds. Extremities: Trace mildly asymmetric (R > L) LE edema noted. Skin: Warm/dry, no rash.  Resolved Hospital Problem List:    Assessment & Plan:   Diabetic ketoacidosis, type 2 diabetes with hyperglycemia AGMA secondary to above ?Pancreatitis, hypertriglyceridemia Nausea, vomiting, abdominal pain History of gastroparesis - Continue IV fluid resuscitation, once gap closed significantly decrease fluids - Insulin gtt with EndoTool (DKA protocol, continue for high TG) - NPO for now, ok for sips; can ADAT to CLD if tolerating - Compazine Q6H PRN - Trend TG - Resume fenofibrate, statin - Question utility/data re: PLEX for hypertriglyceridemia  Pulmonary hypertension - Continue Corlanor, sildenafil  AKI (likely in the setting of DKA) on baseline CKD stage 4 - Trend BMP - Replete electrolytes as indicated (K, Mg, Phos) - Monitor I&Os - F/u urine studies - Avoid nephrotoxic agents as able - Ensure adequate renal perfusion  Acute liver injury, has chronic hepatosplenomegaly  History of hepatic steatosis/fatty liver - Trend LFTs to normal  Hypothyroid TSH 5.284, Free T4 0.74.  - Continue levothyroxine  Best Practice (right click and "Reselect  all SmartList Selections" daily)   Diet/type: NPO DVT prophylaxis: prophylactic heparin  GI prophylaxis: PPI Lines: N/A Foley:  N/A Code Status:  full code Last date of multidisciplinary goals of care discussion [3/4 - Patient updated at bedside]  Critical care time: 36 minutes   The patient is critically ill with multiple organ system failure and requires high complexity decision making for assessment and support, frequent evaluation and titration of therapies, advanced monitoring, review of radiographic studies and interpretation of complex data.   Critical Care Time devoted to patient care services, exclusive of separately billable procedures, described in this note is 36 minutes.  Lestine Mount, PA-C  Pulmonary & Critical Care 05/30/22 8:25 AM  Please see [email or web address] for pager details.  From 7A-7P if no response, please call [phone] After hours, please call [facility] [phone]

## 2022-05-30 NOTE — Progress Notes (Signed)
RT attempted to place pt on bipap, once RT put the mask on the pt face pt immediately started to freak out and stated she couldn't tolerate it.

## 2022-05-31 ENCOUNTER — Inpatient Hospital Stay (HOSPITAL_COMMUNITY): Payer: Medicaid Other

## 2022-05-31 DIAGNOSIS — R87610 Atypical squamous cells of undetermined significance on cytologic smear of cervix (ASC-US): Secondary | ICD-10-CM | POA: Insufficient documentation

## 2022-05-31 DIAGNOSIS — E1110 Type 2 diabetes mellitus with ketoacidosis without coma: Secondary | ICD-10-CM | POA: Diagnosis not present

## 2022-05-31 LAB — CBC
HCT: 23.5 % — ABNORMAL LOW (ref 36.0–46.0)
Hemoglobin: 8 g/dL — ABNORMAL LOW (ref 12.0–15.0)
MCH: 31.4 pg (ref 26.0–34.0)
MCHC: 34 g/dL (ref 30.0–36.0)
MCV: 92.2 fL (ref 80.0–100.0)
Platelets: 132 10*3/uL — ABNORMAL LOW (ref 150–400)
RBC: 2.55 MIL/uL — ABNORMAL LOW (ref 3.87–5.11)
RDW: 17.4 % — ABNORMAL HIGH (ref 11.5–15.5)
WBC: 4.7 10*3/uL (ref 4.0–10.5)
nRBC: 1.5 % — ABNORMAL HIGH (ref 0.0–0.2)

## 2022-05-31 LAB — COMPREHENSIVE METABOLIC PANEL
ALT: 48 U/L — ABNORMAL HIGH (ref 0–44)
AST: 64 U/L — ABNORMAL HIGH (ref 15–41)
Albumin: 2.6 g/dL — ABNORMAL LOW (ref 3.5–5.0)
Alkaline Phosphatase: 127 U/L — ABNORMAL HIGH (ref 38–126)
Anion gap: 10 (ref 5–15)
BUN: 18 mg/dL (ref 6–20)
CO2: 20 mmol/L — ABNORMAL LOW (ref 22–32)
Calcium: 7.8 mg/dL — ABNORMAL LOW (ref 8.9–10.3)
Chloride: 103 mmol/L (ref 98–111)
Creatinine, Ser: 1.32 mg/dL — ABNORMAL HIGH (ref 0.44–1.00)
GFR, Estimated: 55 mL/min — ABNORMAL LOW (ref >60)
Glucose, Bld: 163 mg/dL — ABNORMAL HIGH (ref 70–99)
Potassium: 4.4 mmol/L (ref 3.5–5.1)
Sodium: 133 mmol/L — ABNORMAL LOW (ref 135–145)
Total Bilirubin: 0.2 mg/dL — ABNORMAL LOW (ref 0.3–1.2)
Total Protein: 5.3 g/dL — ABNORMAL LOW (ref 6.5–8.1)

## 2022-05-31 LAB — URINE CULTURE: Culture: >=100000 — AB

## 2022-05-31 LAB — GLUCOSE, CAPILLARY
Glucose-Capillary: 170 mg/dL — ABNORMAL HIGH (ref 70–99)
Glucose-Capillary: 168 mg/dL — ABNORMAL HIGH (ref 70–99)
Glucose-Capillary: 158 mg/dL — ABNORMAL HIGH (ref 70–99)
Glucose-Capillary: 163 mg/dL — ABNORMAL HIGH (ref 70–99)
Glucose-Capillary: 64 mg/dL — ABNORMAL LOW (ref 70–99)
Glucose-Capillary: 142 mg/dL — ABNORMAL HIGH (ref 70–99)
Glucose-Capillary: 85 mg/dL (ref 70–99)

## 2022-05-31 LAB — MAGNESIUM: Magnesium: 2.2 mg/dL (ref 1.7–2.4)

## 2022-05-31 LAB — PHOSPHORUS: Phosphorus: 3.6 mg/dL (ref 2.5–4.6)

## 2022-05-31 LAB — TRIGLYCERIDES: Triglycerides: >5000 mg/dL — ABNORMAL HIGH (ref <150)

## 2022-05-31 MED ORDER — INSULIN REGULAR HUMAN (CONC) 500 UNIT/ML ~~LOC~~ SOPN
150.0000 [IU] | PEN_INJECTOR | Freq: Three times a day (TID) | SUBCUTANEOUS | Status: DC
  Administered 2022-05-31 (×2): 150 [IU] via SUBCUTANEOUS
  Filled 2022-05-31: qty 3

## 2022-05-31 MED ORDER — IVABRADINE HCL 7.5 MG PO TABS
7.5000 mg | ORAL_TABLET | Freq: Two times a day (BID) | ORAL | Status: DC
  Administered 2022-05-31 – 2022-06-03 (×7): 7.5 mg via ORAL
  Filled 2022-05-31 (×8): qty 1

## 2022-05-31 MED ORDER — FUROSEMIDE 10 MG/ML IJ SOLN
40.0000 mg | Freq: Once | INTRAMUSCULAR | Status: AC
  Administered 2022-05-31: 40 mg via INTRAVENOUS
  Filled 2022-05-31: qty 4

## 2022-05-31 MED ORDER — NITROFURANTOIN MONOHYD MACRO 100 MG PO CAPS
100.0000 mg | ORAL_CAPSULE | Freq: Two times a day (BID) | ORAL | Status: DC
  Administered 2022-05-31 – 2022-06-02 (×6): 100 mg via ORAL
  Filled 2022-05-31 (×7): qty 1

## 2022-05-31 MED ORDER — CARVEDILOL 3.125 MG PO TABS
3.1250 mg | ORAL_TABLET | Freq: Two times a day (BID) | ORAL | Status: DC
  Administered 2022-05-31 – 2022-06-03 (×6): 3.125 mg via ORAL
  Filled 2022-05-31 (×7): qty 1

## 2022-05-31 MED ORDER — NOREPINEPHRINE 4 MG/250ML-% IV SOLN
INTRAVENOUS | Status: AC
  Filled 2022-05-31: qty 250

## 2022-05-31 NOTE — Progress Notes (Signed)
NAME:  Jennifer [name], MRN:  [id number], DOB:  03-May-1990, LOS: 2 ADMISSION DATE:  05/29/2022, CONSULTATION DATE:  05/29/2022 REFERRING MD:  EDP, CHIEF COMPLAINT:  Pancreatitis, T2DM   History of Present Illness:  32 year old woman who presented to [facility] ED with abdominal pain, nausea and vomiting. PMHx significant for HTN, HLD, CHF (Echo 11/2021 EF 50-55%, no RWMAs, mildly elevated PASP), pulmonary HTN (s/p CardioMEMS implantation, on Corlanor and sildenafil), T2DM (followed by [facility]), NAFLD, hypothyroidism, astrocytoma, PCOS.  On presentation to ED, patient was found to have an anion gap with acidosis consistent with DKA/hyperglycemia. Was also found to have a TG > 5000.  Concern for ongoing pancreatitis.  CT A/P 3/3 without acute process, no e/o pancreatitis; +hepatosplenomegaly.  PCCM consulted for ICU admission.  Pertinent Medical History:   Past Medical History:  Diagnosis Date   Brain tumor ([place]) 03/29/1995   astrocytoma   CHF (congestive heart failure) (HCC)    Cholesterosis    DM (diabetes mellitus) ([place]) 10/10/2018   Fatty liver    HTN (hypertension) 10/10/2018   Hypothyroidism 10/10/2018   Lipoprotein deficiency    Lung disease    longevity long term   Pancreatitis    Polycystic ovary syndrome     Significant Hospital Events: Including procedures, antibiotic start and stop dates in addition to other pertinent events   3/3 - Presented to [facility] ED with abdominal pain, n/v. DKA with +AG, acidosis. 3/4 - Remains on insulin gtt, glucoses improving. TG remain > 5000. Persistent dysuria, ceftriaxone extended. 3/5 - UCx with >100K E.coli, 80K enterococcus; both ceftriaxone resistant. Abx transitioned to Macrobid. Transition off of insulin gtt to U500 concentrated insulin, 1/2 home dose per DM coordinator. TG remain > 5000, no clinical evidence of pancreatitis.   Interim History / Subjective:  No significant events overnight Feeling much better today, motivated to  get up OOB Appetite is improved Glucoses better, < 200 on insulin gtt Plan to transition off today to U500 concentrated insulin (home med) TG remain > 5000 but no clinical signs of pancreatitis today, needs home Repatha UCx resulted as > 100K E. Coli, 80K enterococcus, resistant to ceftriaxone, changed to Macrobid  Objective:  Blood pressure (!) 164/86, pulse (!) 105, temperature 99 F (37.2 C), temperature source Oral, resp. rate (!) 21, height '4\' 9"'$  (1.448 m), weight 67.9 kg, last menstrual period 04/23/2022, SpO2 98 %.        Intake/Output Summary (Last 24 hours) at 05/31/2022 0804 Last data filed at 05/31/2022 0600 Gross per 24 hour  Intake 3253.5 ml  Output 1150 ml  Net 2103.5 ml    Filed Weights   05/29/22 0628 05/30/22 0701 05/31/22 0600  Weight: 57.2 kg 65.5 kg 67.9 kg   Physical Examination: General: Chronically ill-appearing young woman in NAD. Up to chair at bedside. Pleasant and conversant. HEENT: [place]/AT, anicteric sclera, PERRL, moist mucous membranes. +Hirsuitism Neuro: Awake, oriented x 4. Responds to verbal stimuli. Following commands consistently. Moves all 4 extremities spontaneously. Strength 4-5/5 in all 4 extremities. CV: RRR, no m/g/r. PULM: Breathing even and unlabored on RA. Lung fields CTAB. GI: Soft, nontender, nondistended. Normoactive bowel sounds. Extremities: No significant LE edema noted. Skin: Warm/dry, no rashes.  Resolved Hospital Problem List:    Assessment & Plan:   Diabetic ketoacidosis, type 2 diabetes with hyperglycemia AGMA secondary to above ?Pancreatitis, hypertriglyceridemia Nausea, vomiting, abdominal pain History of gastroparesis - AG closed, ok to transition off of insulin gtt - Diabetes coordinator consulted, appreciate assistance - Resume  U500 concentrated insulin 150U TID WC - Stop insulin gtt 2 hours post-SQ insulin initiation - ADAT to Carb Modified - Compazine Q6H PRN - Trend TG; chronically elevated and likely will  remain so until home Repatha can be administered; no current clinical manifestations of pancreatitis - Continue fenofibrate, statin  Pulmonary hypertension - Continue Corlanor, sildenafil  AKI (likely in the setting of DKA, UTI) on baseline CKD stage 4 E. Coli and enterococcus UTI - Trend BMP - Replete electrolytes as indicated - Monitor I&Os - Avoid nephrotoxic agents as able - Ensure adequate renal perfusion\ - Transition ceftriaxone to Macrobid x 5-day course, given finalized sensitivities (end 3/9)  Acute liver injury, has chronic hepatosplenomegaly History of hepatic steatosis/fatty liver - Trend LFTs  Hypothyroid TSH 5.284, Free T4 0.74.  - Continue levothyroxine  Deconditioning - PT consult prior to d/c  No further ICU needs at this time, PCCM will transfer to [facility] service beginning 3/6.  Thank you for involving [place] in this patient's care. Please do not hesitate to reach out if PCCM can be of further assistance.  Best Practice (right click and "Reselect all SmartList Selections" daily)   Diet/type: NPO DVT prophylaxis: prophylactic heparin  GI prophylaxis: PPI Lines: N/A Foley:  N/A Code Status:  full code Last date of multidisciplinary goals of care discussion [3/5 - Patient updated at bedside]  Critical care time: N/A   Rhae Lerner [facility] 05/31/22 8:04 AM  Please see [email or web address] for pager details.  From 7A-7P if no response, please call [phone] After hours, please call ELink [phone]

## 2022-05-31 NOTE — Progress Notes (Signed)
eLink Physician-Brief Progress Note Patient Name: Jennifer [name] DOB: 02-Nov-1990 MRN: [id number]   Date of Service  05/31/2022  HPI/Events of Note  32 year old female with a history of brittle diabetes who has heart failure with preserved ejection fraction and kidney injury who was diuresed earlier today and developed some lightheadedness and dizziness with evening meds.  Initial MAP was 84 which dropped to 61 after administration of nighttime carvedilol 3.125 mg.  Patient found to be concurrently hypoglycemic to 64 at that time.  Treated with juice and feeling better overall.  eICU Interventions  Maintain euvolemia for now, no diuretics are ordered for the morning.  Carvedilol should wear off and more frequent glucose checks discussed with bedside staff.  Patient is feeling better, no acute intervention is indicated now.     Intervention Category Intermediate Interventions: Hypotension - evaluation and management;Hyperglycemia - evaluation and treatment  Beverlyn Mcginness 05/31/2022, 10:31 PM

## 2022-05-31 NOTE — Progress Notes (Signed)
Hypoglycemic Event  CBG: 64  Treatment: 8 oz juice/soda  Symptoms:  Pale, Sweaty, and Shaky Follow-up CBG: Time:2231 CBG Result:85  Possible Reasons for Event: Medication regimen   Comments/MD notified:Elink notified

## 2022-05-31 NOTE — Inpatient Diabetes Management (Signed)
Inpatient Diabetes Program Recommendations  AACE/ADA: New Consensus Statement on Inpatient Glycemic Control (2015)  Target Ranges:  Prepandial:   less than 140 mg/dL      Peak postprandial:   less than 180 mg/dL (1-2 hours)      Critically ill patients:  140 - 180 mg/dL    Latest Reference Range & Units 05/30/22 20:18 05/30/22 21:33 05/30/22 22:32 05/30/22 23:32 05/31/22 01:25 05/31/22 03:32 05/31/22 05:50 05/31/22 08:03  Glucose-Capillary 70 - 99 mg/dL 215 (H) 178 (H) 174 (H) 165 (H) 170 (H) 168 (H) 158 (H) 163 (H)  (H): Data is abnormally high    Admit with:  Diabetic ketoacidosis, type 2 diabetes with hyperglycemia Anion gap metabolic acidosis secondary to above Possible pancreatitis, hypertriglyceridemia Nausea vomit abdominal pain   History: DM, Pancreatitis, Astrocytoma, Lipoprotein deficiency    Home DM Meds: Humulin R U-500 Insulin 290 units TID with each meal                              Humulin R U-500 Insulin 100 units QHS   Current Orders: IV Insulin Drip     Note pt taking Concentrated U500 Insulin at home Per Chart review, it appears pt has severe insulin resistance due to her Lipoprotein deficiency   Received consult asking for assistance to get pt transitioned to SQ Insulin from the IV Insulin Drip  Recommend for transition:  Start Concentrated Humulin R U500 Insulin 150 units TID with meals (50% home doses)  Would not start the bedtime dose of the U500 Insulin yet until we determine how pt responds to transition to SQ Insulin  Please give the 1st dose of U500 Insulin, continue the IV Insulin drip for 2 hours after U500 Insulin on board, then can d/c the IV Insulin drip       --Will follow patient during hospitalization--  Wyn Quaker RN, MSN, [place] Diabetes Coordinator Inpatient Glycemic Control Team Team Pager: [phone] (8a-5p)

## 2022-05-31 NOTE — Progress Notes (Signed)
Pharmacy Home Medication Reconciliation Communication High Risk Medication: Humulin U-500 Insulin  Home dose of Humulin U-500 insulin was reordered for this patient. The dose was verified with the patient as listed below:  Type of syringe used at home:  [facility] Number or line on syringe to which patient draws up insulin: Equivalent to 290 units with meals and 100 units at bedtime   Other comments pertinent to patient home dosing: Working with diabetes coordinator and reducing prior to admission regimen to 150 units with meals for now and adjust as appropriate   Antonietta Jewel, PharmD, [place] Pharmacist  Phone: [phone] 05/31/2022 9:54 AM  Please check AMION for all [place] phone numbers After 10:00 PM, call [place] [phone]

## 2022-05-31 NOTE — Evaluation (Signed)
Physical Therapy Evaluation Patient Details Name: Jennifer [name] MRN: [id number] DOB: 29-Sep-1990 Today's Date: 05/31/2022  History of Present Illness  This is a 32 year old female presents to hospital with abdominal pain and n/v. Pt found to be in diabetic ketoacidosis PMH: DM II, astrocytoma, lipoprotein deficiency, pancreatitis, hypercholesterolemia, hyper triglyceridemia, pulmonary hypertension status post CardioMEMS implantation, on Corlanor and sildenafil, CHF, fatty liver   Clinical Impression  Pt admitted with above. Upon PT arrival pt's SpO2 was at 89% on RA with noted SOB and pt reporting she breaths better sitting up than in bed. When asked if she was suppose to be on 2Lo2 via [place] as it was laying next to her in the chair pt stated "I don't use that. Don't put me back on it, it just makes me dependent on it." Pt began ambulating and SpO2 dec to 84% on RA with RR at 38, DOE 3/4 requiring standing rest break at 60', however pt states "honestly I feel fine." Upon return to room pt with SOB and RR 44 . Pt agreed to PTs recommendation of using 2Lo2 via [place] until some of the fluid is removed from her body to make it easier to breath. SpO2 at 97% on 2Lo2 however RR still at 36. Pt functioning a min guard and ambulation limited by SOB and drop in SPO2. Suspect once fluid under control and pt's breathing improves pt will mobilize independently. Pt very motivated to return to independence. Acute PT to cont to follow.     Recommendations for follow up therapy are one component of a multi-disciplinary discharge planning process, led by the attending physician.  Recommendations may be updated based on patient status, additional functional criteria and insurance authorization.  Follow Up Recommendations No PT follow up      Assistance Recommended at Discharge Intermittent Supervision/Assistance  Patient can return home with the following       Equipment Recommendations None recommended by PT   Recommendations for Other Services       Functional Status Assessment Patient has had a recent decline in their functional status and demonstrates the ability to make significant improvements in function in a reasonable and predictable amount of time.     Precautions / Restrictions Precautions Precautions: Fall Precaution Comments: L drop foot from astrocytoma as a child Required Braces or Orthoses: Other Brace (suppose to wear an AFO on L foot but doesnt) Restrictions Weight Bearing Restrictions: No      Mobility  Bed Mobility               General bed mobility comments: pt up in chair upon PT arrival and reports being in chair all day because it's easier to breath    Transfers Overall transfer level: Needs assistance Equipment used: None Transfers: Sit to/from Stand Sit to Stand: Min guard           General transfer comment: min guard for safety/lines    Ambulation/Gait Ambulation/Gait assistance: Min guard Gait Distance (Feet): 120 Feet Assistive device: None Gait Pattern/deviations: Step-to pattern, Decreased stride length, Decreased dorsiflexion - left, Antalgic Gait velocity: dec Gait velocity interpretation: <1.31 ft/sec, indicative of household ambulator   General Gait Details: pt with L LE drop foot presenting as antalgic limp, pt reports she is suppose to wear and AFO but doesnt. Pt with noted SOB, SpO2 at 84% on RA, RR 44, one standing rest break, pt given 2Lo2 via [place], SpO2 at 97%, RR 36, RN aware  Stairs  Wheelchair Mobility    Modified Rankin (Stroke Patients Only)       Balance Overall balance assessment: Mild deficits observed, not formally tested (pt with limp but no overt episode of LOB when amb)                                           Pertinent Vitals/Pain Pain Assessment Pain Assessment: No/denies pain    Home Living Family/patient expects to be discharged to:: Private residence Living  Arrangements: Non-relatives/Friends ("roommate") Available Help at Discharge: Friend(s);Available PRN/intermittently (roommate works during the day) Type of Home: Apartment Home Access: Stairs to enter Entrance Stairs-Rails: Right Entrance Stairs-Number of Steps: flight   Home Layout: One level Home Equipment: None      Prior Function Prior Level of Function : Independent/Modified Independent             Mobility Comments: no use of AD, reports difficulty getting in tub but holds onto railing ADLs Comments: drives, does online grocery shopping, watches TV, indep with ADLs     Hand Dominance   Dominant Hand: Right    Extremity/Trunk Assessment   Upper Extremity Assessment Upper Extremity Assessment: Overall WFL for tasks assessed    Lower Extremity Assessment Lower Extremity Assessment: Overall WFL for tasks assessed (residual L drop foot from astrocytoma as a child)    Cervical / Trunk Assessment Cervical / Trunk Assessment: Other exceptions Cervical / Trunk Exceptions: distended abdomen from excess fluid  Communication   Communication: No difficulties  Cognition Arousal/Alertness: Awake/alert Behavior During Therapy: WFL for tasks assessed/performed Overall Cognitive Status: Within Functional Limits for tasks assessed                                          General Comments General comments (skin integrity, edema, etc.): pt with distended abdomen, SOB, inc RR, benefits from 2LO2 via [place]    Exercises     Assessment/Plan    PT Assessment Patient needs continued PT services  PT Problem List Decreased strength;Decreased activity tolerance;Decreased balance;Decreased mobility;Cardiopulmonary status limiting activity       PT Treatment Interventions DME instruction;Gait training;Stair training;Functional mobility training;Therapeutic activities;Therapeutic exercise;Balance training    PT Goals (Current goals can be found in the Care Plan  section)  Acute Rehab PT Goals Patient Stated Goal: "get this fluid off me" PT Goal Formulation: With patient Time For Goal Achievement: 06/14/22 Potential to Achieve Goals: Good Additional Goals Additional Goal #1: Pt to score >19 on DGI to indicate minimal falls risk.    Frequency Min 3X/week     Co-evaluation               AM-PAC PT "6 Clicks" Mobility  Outcome Measure Help needed turning from your back to your side while in a flat bed without using bedrails?: None Help needed moving from lying on your back to sitting on the side of a flat bed without using bedrails?: None Help needed moving to and from a bed to a chair (including a wheelchair)?: None Help needed standing up from a chair using your arms (e.g., wheelchair or bedside chair)?: None Help needed to walk in hospital room?: A Little Help needed climbing 3-5 steps with a railing? : A Little 6 Click Score: 22    End of Session  Activity Tolerance: Patient limited by fatigue (SOB) Patient left: in chair;with call bell/phone within reach Nurse Communication: Mobility status PT Visit Diagnosis: Unsteadiness on feet (R26.81);Difficulty in walking, not elsewhere classified (R26.2)    Time: [id number] PT Time Calculation (min) (ACUTE ONLY): 16 min   Charges:   PT Evaluation $PT Eval Moderate Complexity: 1 Mod          Kittie Plater, PT, DPT Acute Rehabilitation Services Secure chat preferred Office #: [phone]   Berline Lopes 05/31/2022, 2:35 PM

## 2022-06-01 ENCOUNTER — Inpatient Hospital Stay: Payer: Self-pay

## 2022-06-01 ENCOUNTER — Inpatient Hospital Stay (HOSPITAL_COMMUNITY): Payer: Medicaid Other

## 2022-06-01 DIAGNOSIS — N179 Acute kidney failure, unspecified: Secondary | ICD-10-CM

## 2022-06-01 DIAGNOSIS — B952 Enterococcus as the cause of diseases classified elsewhere: Secondary | ICD-10-CM | POA: Insufficient documentation

## 2022-06-01 DIAGNOSIS — E1110 Type 2 diabetes mellitus with ketoacidosis without coma: Secondary | ICD-10-CM | POA: Diagnosis not present

## 2022-06-01 DIAGNOSIS — E1010 Type 1 diabetes mellitus with ketoacidosis without coma: Secondary | ICD-10-CM

## 2022-06-01 DIAGNOSIS — E781 Pure hyperglyceridemia: Secondary | ICD-10-CM | POA: Diagnosis not present

## 2022-06-01 DIAGNOSIS — N1831 Chronic kidney disease, stage 3a: Secondary | ICD-10-CM | POA: Diagnosis not present

## 2022-06-01 DIAGNOSIS — N390 Urinary tract infection, site not specified: Secondary | ICD-10-CM | POA: Insufficient documentation

## 2022-06-01 LAB — LACTATE DEHYDROGENASE: LDH: 1528 U/L — ABNORMAL HIGH (ref 98–192)

## 2022-06-01 LAB — COMPREHENSIVE METABOLIC PANEL
ALT: 88 U/L — ABNORMAL HIGH (ref 0–44)
AST: 172 U/L — ABNORMAL HIGH (ref 15–41)
Albumin: 2.7 g/dL — ABNORMAL LOW (ref 3.5–5.0)
Alkaline Phosphatase: 153 U/L — ABNORMAL HIGH (ref 38–126)
Anion gap: 11 (ref 5–15)
BUN: 18 mg/dL (ref 6–20)
CO2: 21 mmol/L — ABNORMAL LOW (ref 22–32)
Calcium: 8.4 mg/dL — ABNORMAL LOW (ref 8.9–10.3)
Chloride: 98 mmol/L (ref 98–111)
Creatinine, Ser: 1.59 mg/dL — ABNORMAL HIGH (ref 0.44–1.00)
GFR, Estimated: 44 mL/min — ABNORMAL LOW (ref >60)
Glucose, Bld: 242 mg/dL — ABNORMAL HIGH (ref 70–99)
Potassium: 7.1 mmol/L (ref 3.5–5.1)
Sodium: 130 mmol/L — ABNORMAL LOW (ref 135–145)
Total Bilirubin: 3 mg/dL — ABNORMAL HIGH (ref 0.3–1.2)
Total Protein: >12 g/dL — ABNORMAL HIGH (ref 6.5–8.1)

## 2022-06-01 LAB — HEPATIC FUNCTION PANEL
Albumin: 2.7 g/dL — ABNORMAL LOW (ref 3.5–5.0)
Alkaline Phosphatase: 154 U/L — ABNORMAL HIGH (ref 38–126)
Bilirubin, Direct: 2.5 mg/dL — ABNORMAL HIGH (ref 0.0–0.2)
Indirect Bilirubin: 2.3 mg/dL — ABNORMAL HIGH (ref 0.3–0.9)
Total Bilirubin: 4.8 mg/dL — ABNORMAL HIGH (ref 0.3–1.2)

## 2022-06-01 LAB — CBC
HCT: 25 % — ABNORMAL LOW (ref 36.0–46.0)
HCT: 24.3 % — ABNORMAL LOW (ref 36.0–46.0)
HCT: 21.8 % — ABNORMAL LOW (ref 36.0–46.0)
HCT: 24.2 % — ABNORMAL LOW (ref 36.0–46.0)
Hemoglobin: 10.8 g/dL — ABNORMAL LOW (ref 12.0–15.0)
Hemoglobin: 7.7 g/dL — ABNORMAL LOW (ref 12.0–15.0)
Hemoglobin: 6.7 g/dL — CL (ref 12.0–15.0)
Hemoglobin: 7.6 g/dL — ABNORMAL LOW (ref 12.0–15.0)
MCH: 38.2 pg — ABNORMAL HIGH (ref 26.0–34.0)
MCH: 29.3 pg (ref 26.0–34.0)
MCH: 27.8 pg (ref 26.0–34.0)
MCH: 28.3 pg (ref 26.0–34.0)
MCHC: 43.2 g/dL — ABNORMAL HIGH (ref 30.0–36.0)
MCHC: 31.7 g/dL (ref 30.0–36.0)
MCHC: 30.7 g/dL (ref 30.0–36.0)
MCHC: 31.4 g/dL (ref 30.0–36.0)
MCV: 88.3 fL (ref 80.0–100.0)
MCV: 92.4 fL (ref 80.0–100.0)
MCV: 90.5 fL (ref 80.0–100.0)
MCV: 90 fL (ref 80.0–100.0)
Platelets: 123 10*3/uL — ABNORMAL LOW (ref 150–400)
Platelets: 134 10*3/uL — ABNORMAL LOW (ref 150–400)
Platelets: 120 10*3/uL — ABNORMAL LOW (ref 150–400)
Platelets: 158 10*3/uL (ref 150–400)
RBC: 2.83 MIL/uL — ABNORMAL LOW (ref 3.87–5.11)
RBC: 2.63 MIL/uL — ABNORMAL LOW (ref 3.87–5.11)
RBC: 2.41 MIL/uL — ABNORMAL LOW (ref 3.87–5.11)
RBC: 2.69 MIL/uL — ABNORMAL LOW (ref 3.87–5.11)
RDW: 17.2 % — ABNORMAL HIGH (ref 11.5–15.5)
RDW: 18.1 % — ABNORMAL HIGH (ref 11.5–15.5)
RDW: 17.7 % — ABNORMAL HIGH (ref 11.5–15.5)
RDW: 18.1 % — ABNORMAL HIGH (ref 11.5–15.5)
WBC: 7.6 10*3/uL (ref 4.0–10.5)
WBC: 6.6 10*3/uL (ref 4.0–10.5)
WBC: 6.6 10*3/uL (ref 4.0–10.5)
WBC: 5.9 10*3/uL (ref 4.0–10.5)
nRBC: 0 % (ref 0.0–0.2)
nRBC: 0.7 % — ABNORMAL HIGH (ref 0.0–0.2)
nRBC: 3.1 % — ABNORMAL HIGH (ref 0.0–0.2)
nRBC: 0 % (ref 0.0–0.2)

## 2022-06-01 LAB — GLUCOSE, CAPILLARY
Glucose-Capillary: 103 mg/dL — ABNORMAL HIGH (ref 70–99)
Glucose-Capillary: 132 mg/dL — ABNORMAL HIGH (ref 70–99)
Glucose-Capillary: 226 mg/dL — ABNORMAL HIGH (ref 70–99)
Glucose-Capillary: 243 mg/dL — ABNORMAL HIGH (ref 70–99)
Glucose-Capillary: 244 mg/dL — ABNORMAL HIGH (ref 70–99)
Glucose-Capillary: 245 mg/dL — ABNORMAL HIGH (ref 70–99)
Glucose-Capillary: 246 mg/dL — ABNORMAL HIGH (ref 70–99)
Glucose-Capillary: 252 mg/dL — ABNORMAL HIGH (ref 70–99)
Glucose-Capillary: 257 mg/dL — ABNORMAL HIGH (ref 70–99)
Glucose-Capillary: 276 mg/dL — ABNORMAL HIGH (ref 70–99)
Glucose-Capillary: 286 mg/dL — ABNORMAL HIGH (ref 70–99)
Glucose-Capillary: 297 mg/dL — ABNORMAL HIGH (ref 70–99)
Glucose-Capillary: 81 mg/dL (ref 70–99)
Glucose-Capillary: 84 mg/dL (ref 70–99)

## 2022-06-01 LAB — DIFFERENTIAL
Abs Immature Granulocytes: 0.05 10*3/uL (ref 0.00–0.07)
Basophils Absolute: 0 10*3/uL (ref 0.0–0.1)
Basophils Relative: 1 %
Eosinophils Absolute: 0.2 10*3/uL (ref 0.0–0.5)
Eosinophils Relative: 4 %
Immature Granulocytes: 1 %
Lymphocytes Relative: 25 %
Lymphs Abs: 1.5 10*3/uL (ref 0.7–4.0)
Monocytes Absolute: 0.4 10*3/uL (ref 0.1–1.0)
Monocytes Relative: 6 %
Neutro Abs: 3.8 10*3/uL (ref 1.7–7.7)
Neutrophils Relative %: 63 %

## 2022-06-01 LAB — PHOSPHORUS: Phosphorus: 2.7 mg/dL (ref 2.5–4.6)

## 2022-06-01 LAB — BASIC METABOLIC PANEL
Anion gap: 14 (ref 5–15)
BUN: 19 mg/dL (ref 6–20)
CO2: 22 mmol/L (ref 22–32)
Calcium: 8.3 mg/dL — ABNORMAL LOW (ref 8.9–10.3)
Chloride: 97 mmol/L — ABNORMAL LOW (ref 98–111)
Creatinine, Ser: 1.74 mg/dL — ABNORMAL HIGH (ref 0.44–1.00)
GFR, Estimated: 40 mL/min — ABNORMAL LOW (ref >60)
Glucose, Bld: 256 mg/dL — ABNORMAL HIGH (ref 70–99)
Potassium: 5.8 mmol/L — ABNORMAL HIGH (ref 3.5–5.1)
Sodium: 133 mmol/L — ABNORMAL LOW (ref 135–145)

## 2022-06-01 LAB — TECHNOLOGIST SMEAR REVIEW: Plt Morphology: ADEQUATE

## 2022-06-01 LAB — MAGNESIUM: Magnesium: 1.9 mg/dL (ref 1.7–2.4)

## 2022-06-01 LAB — TROPONIN I (HIGH SENSITIVITY)
Troponin I (High Sensitivity): 16 ng/L (ref <18)
Troponin I (High Sensitivity): 14 ng/L (ref <18)

## 2022-06-01 LAB — PREPARE RBC (CROSSMATCH)

## 2022-06-01 LAB — LIPASE, BLOOD: Lipase: 26 U/L (ref 11–51)

## 2022-06-01 LAB — BETA-HYDROXYBUTYRIC ACID: Beta-Hydroxybutyric Acid: 0.26 mmol/L (ref 0.05–0.27)

## 2022-06-01 LAB — TRIGLYCERIDES: Triglycerides: >5000 mg/dL — ABNORMAL HIGH (ref <150)

## 2022-06-01 MED ORDER — INSULIN REGULAR HUMAN (CONC) 500 UNIT/ML ~~LOC~~ SOPN
180.0000 [IU] | PEN_INJECTOR | Freq: Three times a day (TID) | SUBCUTANEOUS | Status: DC
  Administered 2022-06-01: 180 [IU] via SUBCUTANEOUS

## 2022-06-01 MED ORDER — FUROSEMIDE 10 MG/ML IJ SOLN
40.0000 mg | Freq: Once | INTRAMUSCULAR | Status: AC
  Administered 2022-06-01: 40 mg via INTRAVENOUS
  Filled 2022-06-01: qty 4

## 2022-06-01 MED ORDER — NON FORMULARY: 180.0000 [IU] | Freq: Three times a day (TID) | Status: DC

## 2022-06-01 MED ORDER — INSULIN ASPART 100 UNIT/ML IJ SOLN: 0.0000 [IU] | Freq: Three times a day (TID) | INTRAMUSCULAR | Status: DC

## 2022-06-01 MED ORDER — DEXTROSE 5 % IV SOLN: INTRAVENOUS | Status: DC

## 2022-06-01 MED ORDER — OXYCODONE HCL 5 MG PO TABS
5.0000 mg | ORAL_TABLET | ORAL | Status: AC | PRN
  Administered 2022-06-01 – 2022-06-02 (×2): 5 mg via ORAL
  Filled 2022-06-01 (×2): qty 1

## 2022-06-01 MED ORDER — NITROFURANTOIN MONOHYD MACRO 100 MG PO CAPS: 100.0000 mg | ORAL_CAPSULE | Freq: Two times a day (BID) | ORAL | 0 refills | Status: DC

## 2022-06-01 MED ORDER — INSULIN REGULAR HUMAN (CONC) 500 UNIT/ML ~~LOC~~ SOPN
120.0000 [IU] | PEN_INJECTOR | Freq: Three times a day (TID) | SUBCUTANEOUS | Status: DC
  Administered 2022-06-01: 120 [IU] via SUBCUTANEOUS

## 2022-06-01 MED ORDER — INSULIN (MYXREDLIN) INFUSION FOR HYPERTRIGLYCERIDEMIA
0.1000 [IU]/kg/h | INTRAVENOUS | Status: DC
  Administered 2022-06-01 – 2022-06-04 (×9): 0.2 [IU]/kg/h via INTRAVENOUS
  Administered 2022-06-05: 0.1 [IU]/kg/h via INTRAVENOUS
  Filled 2022-06-01 (×11): qty 100

## 2022-06-01 MED ORDER — SODIUM CHLORIDE 0.9% IV SOLUTION: Freq: Once | INTRAVENOUS | Status: DC

## 2022-06-01 MED ORDER — HYDROMORPHONE HCL 1 MG/ML IJ SOLN
0.5000 mg | Freq: Once | INTRAMUSCULAR | Status: AC | PRN
  Administered 2022-06-03: 0.5 mg via INTRAVENOUS
  Filled 2022-06-01: qty 0.5

## 2022-06-01 MED ORDER — INSULIN ASPART 100 UNIT/ML IJ SOLN
0.0000 [IU] | Freq: Three times a day (TID) | INTRAMUSCULAR | Status: DC
  Administered 2022-06-01: 5 [IU] via SUBCUTANEOUS

## 2022-06-01 MED ORDER — INSULIN ASPART 100 UNIT/ML IJ SOLN: 0.0000 [IU] | Freq: Every day | INTRAMUSCULAR | Status: DC

## 2022-06-01 MED ORDER — HUMULIN R U-500 KWIKPEN 500 UNIT/ML ~~LOC~~ SOPN: 120.0000 [IU] | PEN_INJECTOR | Freq: Three times a day (TID) | SUBCUTANEOUS | Status: DC

## 2022-06-01 NOTE — Progress Notes (Signed)
Had pt up ambulating w/ nursing staff. Sats dropped to 88-89% lowest did however have pleuritic type substernal chest discomfort which she rated 8/10. Denied reflux. Denied radiation. EKG was unremarkable.  - Will hold off on dc for now F/u cxr Awaiting chem from this am   Erick Colace ACNP-BC [facility] Pager # [phone] OR # [phone] if no answer

## 2022-06-01 NOTE — Inpatient Diabetes Management (Signed)
Inpatient Diabetes Program Recommendations  AACE/ADA: New Consensus Statement on Inpatient Glycemic Control (2015)  Target Ranges:  Prepandial:   less than 140 mg/dL      Peak postprandial:   less than 180 mg/dL (1-2 hours)      Critically ill patients:  140 - 180 mg/dL    Latest Reference Range & Units 05/31/22 08:03 05/31/22 22:15 05/31/22 22:31 05/31/22 23:25 06/01/22 00:54 06/01/22 02:33 06/01/22 05:02 06/01/22 05:19  Glucose-Capillary 70 - 99 mg/dL 163 (H)  IV Insulin Drip Infusing  Started U500 Insulin at 11:43am 64 (L) 85 142 (H) 132 (H) 103 (H) 81 84  (H): Data is abnormally high (L): Data is abnormally low   Admit with:  Diabetic ketoacidosis, type 2 diabetes with hyperglycemia Anion gap metabolic acidosis secondary to above Possible pancreatitis, hypertriglyceridemia Nausea vomit abdominal pain   History: DM, Pancreatitis, Astrocytoma, Lipoprotein deficiency    Home DM Meds: Humulin R U-500 Insulin 290 units TID with each meal                              Humulin R U-500 Insulin 100 units QHS   Current Orders: Humulin R U-500 Insulin 150 units TID with each meal    MD- Please make sure CBG checks are ordered TID AC + HS  Note CBG was 64 at 22:15  May consider reduction of the U500 Insulin to 120 units TID with meals (20% reduction of doses)  May also consider adding Novolog Sensitive Correction Scale/ SSI (0-9 units) TID AC + HS     --Will follow patient during hospitalization--  Wyn Quaker RN, MSN, [place] Diabetes Coordinator Inpatient Glycemic Control Team Team Pager: [phone] (8a-5p)

## 2022-06-01 NOTE — Progress Notes (Signed)
Spoke with Primary RN and notified that PICC will be done 3/7. Patient has currently 3 PIVs.

## 2022-06-01 NOTE — Progress Notes (Signed)
PROGRESS NOTE    Jennifer [name]  [id number] DOB: [birth year] DOA: 05/29/2022 PCP: Sue Lush, PA-C    Chief Complaint  Patient presents with   Abdominal Pain    Brief Narrative:   32 year old woman who presented to [facility] ED with abdominal pain, nausea and vomiting. PMHx significant for HTN, HLD, CHF (Echo 11/2021 EF 50-55%, no RWMAs, mildly elevated PASP), pulmonary HTN (s/p CardioMEMS implantation, on Corlanor and sildenafil), T2DM (followed by [facility]), NAFLD, hypothyroidism, astrocytoma, PCOS.   On presentation to ED, patient was found to have an anion gap with acidosis consistent with DKA/hyperglycemia. Was also found to have a TG > 5000.  Concern for ongoing pancreatitis.  CT A/P 3/3 without acute process, no e/o pancreatitis; +hepatosplenomegaly.    Assessment & Plan:   Principal Problem:   Hypertriglyceridemia Active Problems:   Acute renal failure superimposed on stage 3a chronic kidney disease (HCC)   History of astrocytoma of brain   Type 2 diabetes mellitus with hyperlipidemia (HCC)   Hypothyroidism   Chronic systolic CHF (congestive heart failure) (HCC)   Essential hypertension   Nonischemic cardiomyopathy (HCC)   Elevated liver enzymes   Pulmonary hypertension (HCC)   NASH (nonalcoholic steatohepatitis)   UTI (urinary tract infection) due to Enterococcus   E. coli UTI (urinary tract infection)  Diabetic ketoacidosis, type 2 diabetes with hyperglycemia AGMA secondary to above -Patient presents with DKA, she was out of her supplies to check her CBGs, even though she still reports she has been compliant with her insulin U-500. -Started on insulin gtt. on admission, anion gap has closed, currently transitioned to U-500 insulin, at the lower dose 150 units 3 times daily (home dose was 260 units 3 times daily)  hypertriglyceridemia Nausea, vomiting, abdominal pain History of gastroparesis -With known history of pancreatitis in the past due to a  familial hypertriglyceridemia -He is on home Repatha -Continue fenofibrate and statin. -IF level remains elevated will resume back on heparin GTT   Pulmonary hypertension - Continue Corlanor, sildenafil   AKI (likely in the setting of DKA, UTI) on baseline CKD stage 4 E. Coli and enterococcus UTI - Trend BMP - Replete electrolytes as indicated - Monitor I&Os - Avoid nephrotoxic agents as able - Ensure adequate renal perfusion\ - Transition ceftriaxone to Macrobid x 5-day course, given finalized sensitivities (end 3/9)   Acute liver injury, has chronic hepatosplenomegaly History of hepatic steatosis/fatty liver - Trend LFTs   Hypothyroid TSH 5.284, Free T4 0.74.  - Continue levothyroxine   Deconditioning - PT consulted  Nontypical chest pain -Nontypical, pleuritic, EKG nonacute, encouraged use incentive spirometer, will trend troponins  Hyperkalemia -Potassium 7.1, but it is hemolyzed, received IV Lasix, will recheck and if remains elevated will give hyperkalemia protocol    DVT prophylaxis: Heparin Code Status: Full Family Communication: none at bedside Disposition:   Status is: Inpatient    Consultants:  PCCM   Subjective:  Today patient denies any complaints, good appetite, was eager to go home, but later in the day she had some dyspnea and pleuritic chest pain with activity, so her discharge has been discontinued.  Objective: Vitals:   06/01/22 1130 06/01/22 1200 06/01/22 1345 06/01/22 1400  BP:  (!) 146/74  (!) 110/53  Pulse:  (!) 103 85 84  Resp: (!) 33 (!) 32 (!) 28 (!) 27  Temp:      TempSrc:      SpO2:  93% 93% 94%  Weight:      Height:  Intake/Output Summary (Last 24 hours) at 06/01/2022 1457 Last data filed at 06/01/2022 1200 Gross per 24 hour  Intake 720 ml  Output 1900 ml  Net -1180 ml   Filed Weights   05/30/22 0701 05/31/22 0600 06/01/22 0635  Weight: 65.5 kg 67.9 kg 67.2 kg    Examination:  Awake Alert, Oriented X 3, No  new F.N deficits, Normal affect , right eye ptosis at baseline. Symmetrical Chest wall movement, Good air movement bilaterally, CTAB RRR,No Gallops,Rubs or new Murmurs, No Parasternal Heave +ve B.Sounds, Abd Soft, No tenderness, No rebound - guarding or rigidity. No Cyanosis, Clubbing or edema, No new Rash or bruise       Data Reviewed: I have personally reviewed following labs and imaging studies  CBC: Recent Labs  Lab 05/29/22 0715 05/29/22 1400 05/29/22 1559 05/29/22 1725 05/30/22 0235 05/31/22 0155 06/01/22 0406 06/01/22 0801  WBC 12.0* 6.3  --   --  5.6 4.7 7.6 6.6  NEUTROABS 10.4*  --   --   --   --   --   --   --   HGB 11.1* 11.1*   < > 12.6 10.4* 8.0* 10.8* 7.7*  HCT 30.3* 34.4*   < > 37.0 30.0* 23.5* 25.0* 24.3*  MCV 86.6 90.3  --   --  93.5 92.2 88.3 92.4  PLT 206 144*  --   --  136* 132* 123* 134*   < > = values in this interval not displayed.    Basic Metabolic Panel: Recent Labs  Lab 05/29/22 2245 05/30/22 0235 05/30/22 1905 05/31/22 0155 06/01/22 1038  NA 135 134* 136 133* 130*  K 4.5 4.2 3.1* 4.4 7.1*  CL 104 103 108 103 98  CO2 13* 18* 13* 20* 21*  GLUCOSE 723* 313* 199* 163* 242*  BUN 36* 36* '19 18 18  '$ CREATININE 1.58* 1.61* 1.22* 1.32* 1.59*  CALCIUM 7.6* 8.1* 6.6* 7.8* 8.4*  MG  --  1.4* 1.9 2.2 1.9  PHOS  --  1.8* 3.1 3.6 2.7    GFR: Estimated Creatinine Clearance: 40.5 mL/min (A) (by C-G formula based on SCr of 1.59 mg/dL (H)).  Liver Function Tests: Recent Labs  Lab 05/29/22 0715 05/30/22 0235 05/31/22 0155 06/01/22 1038  AST 149* 37 64* 172*  ALT 95* 52* 48* 88*  ALKPHOS 208* 151* 127* 153*  BILITOT 0.7 0.4 0.2* 3.0*  PROT UNABLE TO PERFORM DUE TO LIPEMIC INTERFERENCE 5.7* 5.3* >12.0*  ALBUMIN 3.5 2.6* 2.6* 2.7*    CBG: Recent Labs  Lab 06/01/22 0233 06/01/22 0502 06/01/22 0519 06/01/22 1143 06/01/22 1312  GLUCAP 103* 81 84 286* 297*     Recent Results (from the past 240 hour(s))  Urine Culture     Status:  Abnormal   Collection Time: 05/29/22  9:19 AM   Specimen: Urine, Random  Result Value Ref Range Status   Specimen Description   Final    URINE, RANDOM Performed at [facility], [address] [place], [place] [address]    Special Requests   Final    NONE Reflexed from [phone] Performed at [facility], [address] [place], [place] [address]    Culture (A)  Final    >=100,000 COLONIES/mL ESCHERICHIA COLI Confirmed Extended Spectrum Beta-Lactamase Producer (ESBL).  In bloodstream infections from ESBL organisms, carbapenems are preferred over piperacillin/tazobactam. They are shown to have a lower risk of mortality. 80,000 COLONIES/mL ENTEROCOCCUS FAECALIS    Report Status 05/31/2022 FINAL  Final   Organism  ID, Bacteria ESCHERICHIA COLI (A)  Final   Organism ID, Bacteria ENTEROCOCCUS FAECALIS (A)  Final      Susceptibility   Escherichia coli - MIC*    AMPICILLIN >=32 RESISTANT Resistant     CEFAZOLIN >=64 RESISTANT Resistant     CEFEPIME 2 SENSITIVE Sensitive     CEFTRIAXONE >=64 RESISTANT Resistant     CIPROFLOXACIN >=4 RESISTANT Resistant     GENTAMICIN <=1 SENSITIVE Sensitive     IMIPENEM <=0.25 SENSITIVE Sensitive     NITROFURANTOIN <=16 SENSITIVE Sensitive     TRIMETH/SULFA >=320 RESISTANT Resistant     AMPICILLIN/SULBACTAM 16 INTERMEDIATE Intermediate     PIP/TAZO <=4 SENSITIVE Sensitive     * >=100,000 COLONIES/mL ESCHERICHIA COLI   Enterococcus faecalis - MIC*    AMPICILLIN <=2 SENSITIVE Sensitive     NITROFURANTOIN <=16 SENSITIVE Sensitive     VANCOMYCIN 1 SENSITIVE Sensitive     * 80,000 COLONIES/mL ENTEROCOCCUS FAECALIS  MRSA Next Gen by PCR, Nasal     Status: None   Collection Time: 05/29/22  1:09 PM   Specimen: Nasal Mucosa; Nasal Swab  Result Value Ref Range Status   MRSA by PCR Next Gen NOT DETECTED NOT DETECTED Final    Comment: (NOTE) The GeneXpert MRSA Assay (FDA approved for NASAL specimens only), is one  component of a comprehensive MRSA colonization surveillance program. It is not intended to diagnose MRSA infection nor to guide or monitor treatment for MRSA infections. Test performance is not FDA approved in patients less than 31 years old. Performed at [facility], [place] [address], [place], [place] [address]          Radiology Studies: DG Chest Port 1 View  Result Date: 06/01/2022 CLINICAL DATA:  Shortness of breath. EXAM: PORTABLE CHEST 1 VIEW COMPARISON:  May 31, 2022. FINDINGS: Stable cardiomediastinal silhouette. Minimal bibasilar subsegmental atelectasis is noted. Bony thorax is unremarkable. IMPRESSION: Minimal bibasilar subsegmental atelectasis. Electronically Signed   By: Marijo Conception M.D.   On: 06/01/2022 13:34   DG CHEST PORT 1 VIEW  Result Date: 05/31/2022 CLINICAL DATA:  Shortness of breath. Wheezing. EXAM: PORTABLE CHEST 1 VIEW COMPARISON:  Chest radiograph 05/12/2022. Lung bases from abdominal CT 05/29/2022 FINDINGS: Heart is normal in size. Stable mediastinal contours. There is mild diffuse peribronchial thickening. Question of trace left pleural effusion. Mild bibasilar atelectasis without confluent airspace disease. No pneumothorax. Limited assessment, no acute osseous findings. IMPRESSION: Mild diffuse peribronchial thickening, can be seen with asthma or bronchitis. Question trace left pleural effusion. Electronically Signed   By: Keith Rake M.D.   On: 05/31/2022 16:17        Scheduled Meds:  aspirin EC  81 mg Oral Daily   carvedilol  3.125 mg Oral BID WC   Chlorhexidine Gluconate Cloth  6 each Topical Daily   DULoxetine  30 mg Oral QHS   fenofibrate  160 mg Oral Daily   heparin  5,000 Units Subcutaneous Q8H   insulin aspart  0-5 Units Subcutaneous QHS   insulin aspart  0-9 Units Subcutaneous TID WC   insulin regular human CONCENTRATED  180 Units Subcutaneous TID WC   ivabradine  7.5 mg Oral BID WC   levothyroxine  125 mcg Oral Q0600    lipase/protease/amylase  36,000 Units Oral TID   loratadine  10 mg Oral Daily   nitrofurantoin (macrocrystal-monohydrate)  100 mg Oral Q12H   norethindrone-ethinyl estradiol-FE  1 tablet Oral Daily   pantoprazole  40 mg Oral Daily  pregabalin  300 mg Oral BID   rosuvastatin  40 mg Oral Daily   sildenafil  20 mg Oral TID   sodium bicarbonate  650 mg Oral BID   Continuous Infusions:   LOS: 3 days      Phillips Climes, MD [facility]   To contact the attending provider between 7A-7P or the covering provider during after hours 7P-7A, please log into the web site [email or web address] and access using universal [place] password for that web site. If you do not have the password, please call the hospital operator.  06/01/2022, 2:57 PM

## 2022-06-01 NOTE — TOC Initial Note (Signed)
Transition of Care [facility]) - Initial/Assessment Note    Patient Details  Name: Jennifer [name] MRN: [id number] Date of Birth: [birth year]  Transition of [facility]) CM/SW Contact:    Bethena Roys, RN Phone Number: 06/01/2022, 11:30 AM  Clinical Narrative:  Risk for readmission assessment completed. Patient presented for abdominal pain, nausea, and vomiting. PTA patient was from home alone. Case Manager discussed home health services with the patient and she did not need at this time. Plan for transition home today- patient states she will call an [facility] for transport home. No further needs identified at this time.                  Expected Discharge Plan: Home/Self Care Barriers to Discharge: No Barriers Identified   Patient Goals and CMS Choice Patient states their goals for this hospitalization and ongoing recovery are:: Plan to return home   Choice offered to / list presented to : NA      Expected Discharge Plan and Services In-house Referral: NA Discharge Planning Services: CM Consult   Living arrangements for the past 2 months: Apartment Expected Discharge Date: 06/01/22                 DME Agency: NA   Prior Living Arrangements/Services Living arrangements for the past 2 months: Apartment Lives with:: Self Patient language and need for interpreter reviewed:: Yes Do you feel safe going back to the place where you live?: Yes      Need for Family Participation in Patient Care: No (Comment) Care giver support system in place?: No (comment)   Criminal Activity/Legal Involvement Pertinent to Current Situation/Hospitalization: No - Comment as needed  Permission Sought/Granted Permission sought to share information with : Family Supports, Case Manager   Emotional Assessment Appearance:: Appears stated age Attitude/Demeanor/Rapport: Engaged Affect (typically observed): Appropriate Orientation: : Oriented to Self, Oriented to Place, Oriented to  Time, Oriented  to Situation Alcohol / Substance Use: Not Applicable Psych Involvement: No (comment)  Admission diagnosis:  Epigastric pain [R10.13] Hypertriglyceridemia [E78.1] Acute cystitis with hematuria [N30.01] Nausea and vomiting in adult [R11.2] Pancreatitis [K85.90] Patient Active Problem List   Diagnosis Date Noted   UTI (urinary tract infection) due to Enterococcus 06/01/2022   E. coli UTI (urinary tract infection) 06/01/2022   Pancreatitis 05/29/2022   Hypokalemia 03/23/2022   COVID 03/15/2022   COVID-19 virus infection 03/14/2022   Hyponatremia 03/14/2022   Positive D dimer 03/14/2022   Stage 3b chronic kidney disease (CKD) ([place]) 03/14/2022   Pulmonary hypertension ([place]) 02/06/2022   NASH (nonalcoholic steatohepatitis) 02/06/2022   Obesity (BMI 30-39.9) 02/06/2022   Heart failure ([place]) 12/03/2021   Acute on chronic systolic CHF (congestive heart failure) ([place]) 12/02/2021   Elevated troponin 12/02/2021   Pancytopenia ([place]) 12/02/2021   Amenorrhea 12/02/2021   Hyperosmolar hyperglycemic state (HHS) ([place]) 08/26/2021   Small intestinal bacterial overgrowth (SIBO) 08/26/2021   Lower extremity edema 08/26/2021   Hyperkalemia 08/14/2021   Hx of insulin dependent diabetes mellitus 08/14/2021   CKD (chronic kidney disease) stage 4, GFR 15-29 ml/min ([place]) 05/13/2021   Chest pain 03/10/2021   Acute on chronic combined systolic and diastolic CHF (congestive heart failure) ([place]) 02/09/2021   History of astrocytoma of brain 02/09/2021   Severe hyperglycemia due to diabetes mellitus ([place]) 01/25/2021   Diarrhea    Elevated transaminase level    Acute combined systolic and diastolic heart failure ([place])    Nonischemic cardiomyopathy ([place])    Acute decompensated heart failure ([place])  Elevated liver enzymes    Acute renal failure superimposed on stage 3a chronic kidney disease ([place]) [id number]   Chronic systolic CHF (congestive heart failure) ([place]) 11/23/2020   Essential hypertension  11/23/2020   Hypertriglyceridemia 11/23/2020   Prolonged QT interval 11/23/2020   Type 2 diabetes mellitus with hyperlipidemia ([place]) 10/10/2018   Hypothyroidism 10/10/2018   PCP:  Sue Lush, PA-C Pharmacy:   [id number]: [phone]: [phone] Social Determinants of Health ([place] No Food Insecurity (03/14/2022)  Housing: Medium Risk (04/05/2022)  Transportation Needs: Unmet Transportation Needs (04/05/2022)  Utilities: Not At Risk (03/14/2022)  Alcohol Screen: Low Risk  (12/10/2021)  Depression (PHQ2-9): Low Risk  (12/10/2021)  Financial Resource Strain: High Risk (04/05/2022)  Physical Activity: Sufficiently Active (12/10/2021)  Social Connections: Socially Integrated (12/10/2021)  Stress: No Stress Concern Present (12/10/2021)  Tobacco Use: Low Risk  (05/29/2022)   SDOH Interventions:     Readmission Risk Interventions    06/01/2022   11:13 AM 03/24/2022    5:05 PM 12/07/2021    4:36 PM  Readmission Risk Prevention Plan  Transportation Screening Complete Complete Complete  Medication Review (RN Care Manager) Referral to Pharmacy Complete Complete  PCP or Specialist appointment within 3-5 days of discharge  Patient refused Complete  HRI or [place]  Complete  SW Recovery Care/Counseling Consult Complete  Complete  Palliative Care Screening Not Applicable  Not [place] Applic[place] Not Applicable Not Applicable

## 2022-06-01 NOTE — Discharge Summary (Addendum)
Physician Discharge Summary         Patient ID: Jennifer [name] MRN: [id number] DOB/AGE: 1990/08/08 32 y.o.  Admit date: 05/29/2022 Discharge date: 06/01/2022  Discharge Diagnoses:    Active Hospital Problems   Diagnosis Date Noted   Hypertriglyceridemia 11/23/2020    Priority: 2.   Type 2 diabetes mellitus with hyperlipidemia ([place]) 10/10/2018    Priority: 1.   Pulmonary hypertension ([place]) 02/06/2022    Priority: 2.   Nonischemic cardiomyopathy ([place])     Priority: 2.   Chronic systolic CHF (congestive heart failure) ([place]) 11/23/2020    Priority: 2.   Essential hypertension 11/23/2020    Priority: 2.   Acute renal failure superimposed on stage 3a chronic kidney disease ([place]) 11/26/2020    Priority: 3.   NASH (nonalcoholic steatohepatitis) 02/06/2022    Priority: 4.   Elevated liver enzymes     Priority: 4.   UTI (urinary tract infection) due to Enterococcus 06/01/2022    Priority: 5.   E. coli UTI (urinary tract infection) 06/01/2022    Priority: 5.   History of astrocytoma of brain 02/09/2021   Hypothyroidism 10/10/2018    Resolved Hospital Problems   Diagnosis Date Noted Date Resolved   DKA (diabetic ketoacidosis) ([place]) 06/01/2022 06/01/2022   Abdominal pain 11/23/2020 06/01/2022      Discharge summary    32 year old woman who presented to [facility] ED with abdominal pain, nausea and vomiting. PMHx significant for HTN, HLD, CHF (Echo 11/2021 EF 50-55%, no RWMAs, mildly elevated PASP), pulmonary HTN (s/p CardioMEMS implantation, on Corlanor and sildenafil), T2DM (followed by [facility]), NAFLD, hypothyroidism, astrocytoma, PCOS.   On presentation to ED, patient was found to have an anion gap with acidosis consistent with DKA/hyperglycemia. Was also found to have a TG > 5000.  Concern for ongoing pancreatitis.  CT A/P 3/3 without acute process, no e/o pancreatitis; +hepatosplenomegaly.  Admitted to the critical care team Hospital course:  3/3 - Presented to [facility]  ED with abdominal pain, n/v. DKA with +AG, acidosis. 3/4 - Remains on insulin gtt, glucoses improving. TG remain > 5000. Persistent dysuria, ceftriaxone extended. 3/5 - UCx with >100K E.coli, 80K enterococcus; both ceftriaxone resistant. Abx transitioned to Macrobid. Transition off of insulin gtt to U500 concentrated insulin, 1/2 home dose per DM coordinator. TG remain > 5000, no clinical evidence of pancreatitis.  3/6 feeling better. Tolerating diet ready for dc home   Discharge Plan by Active Problems    type 2 diabetes with hyperglycemia Ag closed, not close to her home regimen. Actually on the hypoglycemic side Plan Home w/ the following:  Reduce the U500 Insulin to 180 units TID with meals for home. Do not take the Bedtime dose that she usually takes for now. Monitor CBGs frequently with her Dexcom CGM. If she notices her CBGs are trending upward, may want to increase the doses to 50% home doses and Call her Endocrinologist    Chronic Hypertriglyceridemia  Plan Trend TG; chronically elevated and likely will remain so until home Repatha can be administered; no current clinical manifestations of pancreatitis Continue fenofibrate, statin   History of gastroparesis Plan Reflux precautions    H/o Pulmonary hypertension, systolic HTN, chronic systolic HF  Plan Continue Corlanor, sildenafil   AKI (likely in the setting of DKA, UTI) on baseline CKD stage 4 E. Coli and enterococcus UTI Plan Macrobid x 5-day course, given finalized sensitivities (end 3/9)   Acute liver injury, has chronic hepatosplenomegaly History of hepatic steatosis/fatty liver Plan F/u out  pt    Hypothyroid TSH 5.284, Free T4 0.74.  Plan Continue levothyroxine  Discharge Exam: Blood Pressure 132/75   Pulse 96   Temperature 98.7 F (37.1 C) (Oral)   Respiration (Abnormal) 23   Height '4\' 9"'$  (1.448 m)   Weight 67.2 kg   Last Menstrual Period 04/23/2022   Oxygen Saturation 96%   Body Mass Index 32.07  kg/m   General 31 year old female. Sitting up in chair. [name] in no distress  HENT NCAT no JVD  Pulm clear  Card rrr Abd soft  Ext warm and dry Neuro intact  Labs at discharge   Lab Results  Component Value Date   CREATININE 1.32 (H) 05/31/2022   BUN 18 05/31/2022   NA 133 (L) 05/31/2022   K 4.4 05/31/2022   CL 103 05/31/2022   CO2 20 (L) 05/31/2022   Lab Results  Component Value Date   WBC 7.6 06/01/2022   HGB 10.8 (L) 06/01/2022   HCT 25.0 (L) 06/01/2022   MCV 88.3 06/01/2022   PLT 123 (L) 06/01/2022   Lab Results  Component Value Date   ALT 48 (H) 05/31/2022   AST 64 (H) 05/31/2022   ALKPHOS 127 (H) 05/31/2022   BILITOT 0.2 (L) 05/31/2022   Lab Results  Component Value Date   INR 1.0 02/09/2022   INR 1.5 (H) 11/28/2020    Current radiological studies    DG CHEST PORT 1 VIEW  Result Date: 05/31/2022 CLINICAL DATA:  Shortness of breath. Wheezing. EXAM: PORTABLE CHEST 1 VIEW COMPARISON:  Chest radiograph 05/12/2022. Lung bases from abdominal CT 05/29/2022 FINDINGS: Heart is normal in size. Stable mediastinal contours. There is mild diffuse peribronchial thickening. Question of trace left pleural effusion. Mild bibasilar atelectasis without confluent airspace disease. No pneumothorax. Limited assessment, no acute osseous findings. IMPRESSION: Mild diffuse peribronchial thickening, can be seen with asthma or bronchitis. Question trace left pleural effusion. Electronically Signed   By: Keith Rake M.D.   On: 05/31/2022 16:17    Disposition:    Discharge disposition: 01-Home or Self Care       Discharge Instructions     Diet - low sodium heart healthy   Complete by: As directed    Discharge instructions   Complete by: As directed    You will be going home on reduced dose insulin.  120 units before meals. Hold off on bedtime insulin Check glucose frequently  If you notice it is climbing increase next dose by 60 units and let your doctor who manages  your insulin know   Increase activity slowly   Complete by: As directed        Allergies as of 06/01/2022     Allergen Reactions Comments   Ketamine Other (See Comments) In vegetative state for 15 minutes per pt   Erythromycin  unk   Fentanyl Nausea And Vomiting    M[place]ake Mushroom [maitake] Itching Itchy throat   Penicillin V Itching Gi upset   Shellfish Allergy Other (See Comments) Pt has never had shellfish but tested positive on allergy test. Pt states contrast in CT is okay   Morphine Itching, Rash    Penicillins Rash Has patient had a PCN reaction causing immediate rash, facial/tongue/throat swelling, SOB or lightheadedness with hypotension: Y Has patient had a PCN reaction causing severe rash involving mucus membranes or skin necrosis: Y Has patient had a PCN reaction that required hospitalization: N Has patient had a PCN reaction occurring within the last 10 years:  Y If all of the above answers are "NO", then may proceed with Cephalosporin use.   Prednisone Rash         Medication List     Stop taking these medications    ciprofloxacin 500 MG tablet Commonly known as: CIPRO   clindamycin 150 MG capsule Commonly known as: CLEOCIN       Take these medications    albuterol 108 (90 Base) MCG/ACT inhaler Commonly known as: VENTOLIN HFA Inhale 1-2 puffs into the lungs every 6 (six) hours as needed for wheezing or shortness of breath.   aspirin EC 81 MG tablet Take 1 tablet (81 mg total) by mouth daily. Swallow whole.   carvedilol 3.125 MG tablet Commonly known as: COREG Take 1 tablet (3.125 mg total) by mouth 2 (two) times daily with a meal.   Creon 36000 UNITS Cpep capsule Generic drug: lipase/protease/amylase Take 36,000 Units by mouth 3 (three) times daily.   DULoxetine 30 MG capsule Commonly known as: CYMBALTA Take 30 mg by mouth at bedtime.   ergocalciferol 1.25 MG (50000 UT) capsule Commonly known as: VITAMIN D2 Take 50,000 Units by mouth every 7  (seven) days.   fenofibrate 160 MG tablet Take 1 tablet (160 mg total) by mouth daily.   guaiFENesin-dextromethorphan 100-10 MG/5ML syrup Commonly known as: ROBITUSSIN DM Take 10 mLs by mouth every 6 (six) hours as needed for cough.   [name] 1/20 1-20 MG-MCG tablet Generic drug: norethindrone-ethinyl estradiol-FE Take 1 tablet by mouth daily.   HumuLIN R U-500 KwikPen 500 UNIT/ML KwikPen Generic drug: insulin regular human CONCENTRATED Inject 120 Units into the skin 3 (three) times daily with meals. What changed:  medication strength how much to take when to take this additional instructions   Insulin Pen Needle 32G X 4 MM Misc 1 Device by Does not apply route QID. For use with insulin pens   ivabradine 7.5 MG Tabs tablet Commonly known as: CORLANOR Take 1 tablet (7.5 mg total) by mouth 2 (two) times daily with a meal.   levocetirizine 5 MG tablet Commonly known as: XYZAL Take 5 mg by mouth daily.   levothyroxine 125 MCG tablet Commonly known as: SYNTHROID Take 1 tablet (125 mcg total) by mouth daily at 6 (six) AM.   nitrofurantoin (macrocrystal-monohydrate) 100 MG capsule Commonly known as: MACROBID Take 1 capsule (100 mg total) by mouth every 12 (twelve) hours.   pantoprazole 40 MG tablet Commonly known as: PROTONIX Take 40 mg by mouth daily.   pregabalin 150 MG capsule Commonly known as: LYRICA Take 300 mg by mouth at bedtime.   prochlorperazine 5 MG tablet Commonly known as: COMPAZINE Take 5 mg by mouth daily.   Repatha 140 MG/ML [place] Generic drug: Evolocumab Inject 140 mg into the skin See admin instructions. Inject 140 mg subcutaneously every other Monday evening   rosuvastatin 40 MG tablet Commonly known as: CRESTOR Take 40 mg by mouth daily.   sildenafil 20 MG tablet Commonly known as: REVATIO Take 1 tablet (20 mg total) by mouth 3 (three) times daily.   sodium bicarbonate 650 MG tablet Take 650 mg by mouth 2 (two) times daily.    Torsemide 40 MG Tabs Take 60 mg by mouth daily.   tretinoin 0.05 % cream Commonly known as: RETIN-A Apply 1 application topically at bedtime.         Follow-up appointment   1 week Discharge Condition:    good  Physician Statement:   The Patient was personally examined, the  discharge assessment and plan has been personally reviewed and I agree with ACNP Alando Colleran's assessment and plan. 34 minutes of time have been dedicated to discharge assessment, planning and discharge instructions.   Signed: Clementeen Graham 06/01/2022, 9:49 AM

## 2022-06-01 NOTE — Discharge Instructions (Addendum)
Check your glucose frequently.  Hold before bed insulin IF glucose is trending up.... You will be going home on 120 units before meals. If glucose trends up increase by 60 units and call your doctor who manages your insulin   Please do not take torsemide for next 2-3 days until you see your PCP and BMP is repeated. For any questions about the timing of resuming torsemide, please consult with your PCP or call your cardiologist.

## 2022-06-02 ENCOUNTER — Other Ambulatory Visit (HOSPITAL_COMMUNITY): Payer: Self-pay

## 2022-06-02 DIAGNOSIS — N3001 Acute cystitis with hematuria: Secondary | ICD-10-CM | POA: Diagnosis not present

## 2022-06-02 DIAGNOSIS — E1010 Type 1 diabetes mellitus with ketoacidosis without coma: Secondary | ICD-10-CM | POA: Diagnosis not present

## 2022-06-02 DIAGNOSIS — E781 Pure hyperglyceridemia: Secondary | ICD-10-CM | POA: Diagnosis not present

## 2022-06-02 DIAGNOSIS — N179 Acute kidney failure, unspecified: Secondary | ICD-10-CM | POA: Diagnosis not present

## 2022-06-02 LAB — TYPE AND SCREEN
ABO/RH(D): A POS
Antibody Screen: NEGATIVE
Unit division: 0

## 2022-06-02 LAB — BASIC METABOLIC PANEL
Anion gap: 6 (ref 5–15)
BUN: 20 mg/dL (ref 6–20)
CO2: 23 mmol/L (ref 22–32)
Calcium: 7.5 mg/dL — ABNORMAL LOW (ref 8.9–10.3)
Chloride: 92 mmol/L — ABNORMAL LOW (ref 98–111)
Creatinine, Ser: 1.07 mg/dL — ABNORMAL HIGH (ref 0.44–1.00)
GFR, Estimated: >60 mL/min (ref >60)
Glucose, Bld: 69 mg/dL — ABNORMAL LOW (ref 70–99)
Sodium: 121 mmol/L — ABNORMAL LOW (ref 135–145)

## 2022-06-02 LAB — GLUCOSE, CAPILLARY
Glucose-Capillary: 101 mg/dL — ABNORMAL HIGH (ref 70–99)
Glucose-Capillary: 112 mg/dL — ABNORMAL HIGH (ref 70–99)
Glucose-Capillary: 120 mg/dL — ABNORMAL HIGH (ref 70–99)
Glucose-Capillary: 139 mg/dL — ABNORMAL HIGH (ref 70–99)
Glucose-Capillary: 150 mg/dL — ABNORMAL HIGH (ref 70–99)
Glucose-Capillary: 174 mg/dL — ABNORMAL HIGH (ref 70–99)
Glucose-Capillary: 176 mg/dL — ABNORMAL HIGH (ref 70–99)
Glucose-Capillary: 181 mg/dL — ABNORMAL HIGH (ref 70–99)
Glucose-Capillary: 86 mg/dL (ref 70–99)
Glucose-Capillary: 92 mg/dL (ref 70–99)
Glucose-Capillary: 99 mg/dL (ref 70–99)

## 2022-06-02 LAB — HEPATIC FUNCTION PANEL
ALT: 78 U/L — ABNORMAL HIGH (ref 0–44)
AST: 110 U/L — ABNORMAL HIGH (ref 15–41)
Albumin: 2.4 g/dL — ABNORMAL LOW (ref 3.5–5.0)
Alkaline Phosphatase: 176 U/L — ABNORMAL HIGH (ref 38–126)
Bilirubin, Direct: 1.3 mg/dL — ABNORMAL HIGH (ref 0.0–0.2)
Indirect Bilirubin: 1.4 mg/dL — ABNORMAL HIGH (ref 0.3–0.9)
Total Bilirubin: 2.7 mg/dL — ABNORMAL HIGH (ref 0.3–1.2)
Total Protein: 5.3 g/dL — ABNORMAL LOW (ref 6.5–8.1)

## 2022-06-02 LAB — BPAM RBC
Blood Product Expiration Date: 202404032359
ISSUE DATE / TIME: 202403061929
Unit Type and Rh: 6200

## 2022-06-02 LAB — COMPREHENSIVE METABOLIC PANEL
Albumin: 2.5 g/dL — ABNORMAL LOW (ref 3.5–5.0)
Alkaline Phosphatase: 152 U/L — ABNORMAL HIGH (ref 38–126)
Anion gap: 13 (ref 5–15)
BUN: 20 mg/dL (ref 6–20)
CO2: 19 mmol/L — ABNORMAL LOW (ref 22–32)
Calcium: 7.8 mg/dL — ABNORMAL LOW (ref 8.9–10.3)
Chloride: 96 mmol/L — ABNORMAL LOW (ref 98–111)
Creatinine, Ser: 1.63 mg/dL — ABNORMAL HIGH (ref 0.44–1.00)
GFR, Estimated: 43 mL/min — ABNORMAL LOW (ref >60)
Glucose, Bld: 167 mg/dL — ABNORMAL HIGH (ref 70–99)
Sodium: 128 mmol/L — ABNORMAL LOW (ref 135–145)
Total Bilirubin: 5.1 mg/dL — ABNORMAL HIGH (ref 0.3–1.2)

## 2022-06-02 LAB — RETICULOCYTES
Immature Retic Fract: 15.2 % (ref 2.3–15.9)
RBC.: 2.71 MIL/uL — ABNORMAL LOW (ref 3.87–5.11)
Retic Count, Absolute: 44.7 10*3/uL (ref 19.0–186.0)
Retic Ct Pct: 1.7 % (ref 0.4–3.1)

## 2022-06-02 LAB — POCT I-STAT EG7
Acid-base deficit: 1 mmol/L (ref 0.0–2.0)
Bicarbonate: 24.4 mmol/L (ref 20.0–28.0)
Calcium, Ion: 1.15 mmol/L (ref 1.15–1.40)
HCT: 27 % — ABNORMAL LOW (ref 36.0–46.0)
Hemoglobin: 9.2 g/dL — ABNORMAL LOW (ref 12.0–15.0)
O2 Saturation: 33 %
Patient temperature: 98.1
Potassium: 4.1 mmol/L (ref 3.5–5.1)
Sodium: 129 mmol/L — ABNORMAL LOW (ref 135–145)
TCO2: 26 mmol/L (ref 22–32)
pCO2, Ven: 42.6 mmHg — ABNORMAL LOW (ref 44–60)
pH, Ven: 7.364 (ref 7.25–7.43)
pO2, Ven: 21 mmHg — CL (ref 32–45)

## 2022-06-02 LAB — CBC
HCT: 24.4 % — ABNORMAL LOW (ref 36.0–46.0)
Hemoglobin: 8.3 g/dL — ABNORMAL LOW (ref 12.0–15.0)
MCH: 29.7 pg (ref 26.0–34.0)
MCHC: 34 g/dL (ref 30.0–36.0)
MCV: 87.5 fL (ref 80.0–100.0)
Platelets: 174 10*3/uL (ref 150–400)
RBC: 2.79 MIL/uL — ABNORMAL LOW (ref 3.87–5.11)
RDW: 17.3 % — ABNORMAL HIGH (ref 11.5–15.5)
WBC: 6.8 10*3/uL (ref 4.0–10.5)
nRBC: 0 % (ref 0.0–0.2)

## 2022-06-02 LAB — DIRECT ANTIGLOBULIN TEST (NOT AT ARMC)
DAT, IgG: NEGATIVE
DAT, complement: NEGATIVE

## 2022-06-02 LAB — IRON AND TIBC
Iron: 66 ug/dL (ref 28–170)
Saturation Ratios: 27 % (ref 10.4–31.8)
TIBC: 245 ug/dL — ABNORMAL LOW (ref 250–450)
UIBC: 179 ug/dL

## 2022-06-02 LAB — MAGNESIUM: Magnesium: 2 mg/dL (ref 1.7–2.4)

## 2022-06-02 LAB — FOLATE: Folate: 13.7 ng/mL (ref >5.9)

## 2022-06-02 LAB — FERRITIN: Ferritin: 343 ng/mL — ABNORMAL HIGH (ref 11–307)

## 2022-06-02 LAB — VITAMIN B12: Vitamin B-12: 1521 pg/mL — ABNORMAL HIGH (ref 180–914)

## 2022-06-02 LAB — PATHOLOGIST SMEAR REVIEW

## 2022-06-02 LAB — PHOSPHORUS: Phosphorus: 2.3 mg/dL — ABNORMAL LOW (ref 2.5–4.6)

## 2022-06-02 LAB — TRIGLYCERIDES: Triglycerides: >5000 mg/dL — ABNORMAL HIGH (ref <150)

## 2022-06-02 MED ORDER — SODIUM CHLORIDE 0.9% FLUSH
10.0000 mL | Freq: Two times a day (BID) | INTRAVENOUS | Status: DC
  Administered 2022-06-02 – 2022-06-12 (×17): 10 mL

## 2022-06-02 MED ORDER — FUROSEMIDE 10 MG/ML IJ SOLN
40.0000 mg | Freq: Two times a day (BID) | INTRAMUSCULAR | Status: DC
  Administered 2022-06-02 (×2): 40 mg via INTRAVENOUS
  Filled 2022-06-02 (×2): qty 4

## 2022-06-02 MED ORDER — DEXTROSE-NACL 5-0.9 % IV SOLN: INTRAVENOUS | Status: DC

## 2022-06-02 MED ORDER — SODIUM CHLORIDE 0.9% FLUSH
10.0000 mL | INTRAVENOUS | Status: DC | PRN
  Administered 2022-06-02: 10 mL

## 2022-06-02 MED ORDER — FUROSEMIDE 10 MG/ML IJ SOLN: 40.0000 mg | Freq: Two times a day (BID) | INTRAMUSCULAR | Status: DC

## 2022-06-02 MED ORDER — DEXTROSE 10 % IV SOLN: INTRAVENOUS | Status: DC

## 2022-06-02 NOTE — Progress Notes (Addendum)
PROGRESS NOTE    Jennifer [name]  [id number] DOB: [birth year] DOA: 05/29/2022 PCP: Sue Lush, PA-C    Chief Complaint  Patient presents with   Abdominal Pain    Brief Narrative:   32 year old woman who presented to [facility] ED with abdominal pain, nausea and vomiting. PMHx significant for HTN, HLD, CHF (Echo 11/2021 EF 50-55%, no RWMAs, mildly elevated PASP), pulmonary HTN (s/p CardioMEMS implantation, on Corlanor and sildenafil), T2DM (followed by [facility]), NAFLD, hypothyroidism, astrocytoma, PCOS.   On presentation to ED, patient was found to have an anion gap with acidosis consistent with DKA/hyperglycemia. Was also found to have a TG > 5000.  Concern for ongoing pancreatitis.  CT A/P 3/3 without acute process, no e/o pancreatitis; +hepatosplenomegaly. -Patient's anion gap has closed, she was transitioned off insulin drip, but her workup was noted for evolving anemia, suspicious for hemolytic anemia with elevated LFTs, and LDH, as well patient with significant difficulty with lab accuracy likely in the setting of her hypertriglyceridemia.    Assessment & Plan:   Principal Problem:   Hypertriglyceridemia Active Problems:   Acute renal failure superimposed on stage 3a chronic kidney disease (HCC)   History of astrocytoma of brain   Type 2 diabetes mellitus with hyperlipidemia (HCC)   Hypothyroidism   Chronic systolic CHF (congestive heart failure) (HCC)   Essential hypertension   Nonischemic cardiomyopathy (HCC)   Elevated liver enzymes   Pulmonary hypertension (HCC)   NASH (nonalcoholic steatohepatitis)   UTI (urinary tract infection) due to Enterococcus   E. coli UTI (urinary tract infection)   DKA (diabetic ketoacidosis) (HCC)  Diabetic ketoacidosis, type 2 diabetes with hyperglycemia AGMA secondary to above -He is on insulin U-500 at home, reports she has been compliant, reports she ran out of Dexcom refills, so she was not checking her CBGs.. -Insulin  GTT DKA protocol has been discontinued after anion gap closed.  On 3/5. -Currently she is back on insulin drip for hypertriglyceridemia protocol.  Requiring D5 NS while on insulin drip.  hypertriglyceridemia Nausea, vomiting, abdominal pain History of chronic pancreatitis -With known history of pancreatitis in the past due to a familial hypertriglyceridemia -He is on home Repatha -Continue fenofibrate and statin. -Darted on insulin gtt. for triglyceride> 5000 -Continue with Creon -Lipase within normal limit, no epigastric pain, no evidence of pancreatitis on imaging.   Anemia  - hemoglobin has been gradually dropping, 6.7 yesterday for which she required 1 unit PRBC -CT abdomen pelvis obtained, no evidence of retroperitoneal hematoma -Total bili, direct bili, LDH are elevated, concern for hemolytic anemia.Hematology consulted .  Hyperkalemia/hyponatremia -Patient with multiple rapid electrolyte abnormalities radiations, this is most likely attributed to inaccurate readings in the presence of her hypertriglyceridemia. -I-STAT has been obtained, which confers sodium of 128, and potassium of 4.1 -Renal consulted  Acute on chronic diastolic CHF -She did receive 1 unit of blood yesterday, as well she is receiving IV fluid with her insulin drip, with evidence of volume overload, will trend BMP and start on Lasix 40 mg IV twice daily  Pulmonary hypertension - Continue Corlanor, sildenafil   AKI (likely in the setting of DKA, UTI) on baseline CKD stage 4 E. Coli and enterococcus UTI - Avoid nephrotoxic agents as able - Ensure adequate renal perfusion - Transition ceftriaxone to Macrobid x 5-day course, given finalized sensitivities (end 3/9)   Acute liver injury, has chronic hepatosplenomegaly History of hepatic steatosis/fatty liver - Trend LFTs   Hypothyroid TSH 5.284, Free T4 0.74.  - Continue  levothyroxine   Deconditioning - PT consulted  Nontypical chest pain -Nontypical,  pleuritic, EKG nonacute, encouraged use incentive spirometer, negative troponins  Hyperkalemia -Samples usually hemolyzed  DVT prophylaxis: Heparin stopped, will start SCD Code Status: Full Family Communication: none at bedside Disposition:   Status is: Inpatient    Consultants:  PCCM Renal Hematology   Subjective:  Patient denies chest pain, shortness of breath, no nausea, no vomiting, no abdominal pain today.  Objective: Vitals:   06/02/22 0500 06/02/22 0600 06/02/22 0700 06/02/22 0800  BP: 116/62 109/66 (!) 115/55 (!) 107/53  Pulse: 80 75 81 78  Resp: (!) 22 (!) 23 (!) 27 (!) 23  Temp:      TempSrc:      SpO2: 90% 90% (!) 89% 95%  Weight: 62.8 kg     Height:        Intake/Output Summary (Last 24 hours) at 06/02/2022 1057 Last data filed at 06/02/2022 0800 Gross per 24 hour  Intake 1891.45 ml  Output 1400 ml  Net 491.45 ml   Filed Weights   05/31/22 0600 06/01/22 0635 06/02/22 0500  Weight: 67.9 kg 67.2 kg 62.8 kg    Examination:  Awake Alert, Oriented X 3, No new F.N deficits, Normal affect, right eye ptosis  Symmetrical Chest wall movement, bibasilar crackles RRR,No Gallops,Rubs or new Murmurs, No Parasternal Heave +ve B.Sounds, Abd Soft, No tenderness, No rebound - guarding or rigidity. No Cyanosis, Clubbing or edema, No new Rash or bruise       Data Reviewed: I have personally reviewed following labs and imaging studies  CBC: Recent Labs  Lab 05/29/22 0715 05/29/22 1400 06/01/22 0406 06/01/22 0801 06/01/22 1510 06/01/22 1822 06/02/22 0208 06/02/22 1009  WBC 12.0*   < > 7.6 6.6 6.6 5.9 6.8  --   NEUTROABS 10.4*  --   --   --   --  3.8  --   --   HGB 11.1*   < > 10.8* 7.7* 6.7* 7.6* 8.3* 9.2*  HCT 30.3*   < > 25.0* 24.3* 21.8* 24.2* 24.4* 27.0*  MCV 86.6   < > 88.3 92.4 90.5 90.0 87.5  --   PLT 206   < > 123* 134* 120* 158 174  --    < > = values in this interval not displayed.    Basic Metabolic Panel: Recent Labs  Lab 05/30/22 0235  05/30/22 1905 05/31/22 0155 06/01/22 1038 06/01/22 1548 06/02/22 0208 06/02/22 0620 06/02/22 1009  NA 134* 136 133* 130* 133* 128* 121* 129*  K 4.2 3.1* 4.4 7.1* 5.8* SPECIMEN HEMOLYZED. HEMOLYSIS MAY AFFECT INTEGRITY OF RESULTS. SPECIMEN HEMOLYZED. HEMOLYSIS MAY AFFECT INTEGRITY OF RESULTS. 4.1  CL 103 108 103 98 97* 96* 92*  --   CO2 18* 13* 20* 21* 22 19* 23  --   GLUCOSE 313* 199* 163* 242* 256* 167* 69*  --   BUN 36* '19 18 18 19 20 20  '$ --   CREATININE 1.61* 1.22* 1.32* 1.59* 1.74* 1.63* 1.07*  --   CALCIUM 8.1* 6.6* 7.8* 8.4* 8.3* 7.8* 7.5*  --   MG 1.4* 1.9 2.2 1.9  --  2.0  --   --   PHOS 1.8* 3.1 3.6 2.7  --  2.3*  --   --     GFR: Estimated Creatinine Clearance: 58.1 mL/min (A) (by C-G formula based on SCr of 1.07 mg/dL (H)).  Liver Function Tests: Recent Labs  Lab 05/30/22 0235 05/31/22 0155 06/01/22 1038 06/01/22 1822 06/02/22 [id number]  AST 37 64* 172* RESULTS UNAVAILABLE DUE TO INTERFERING SUBSTANCE SPECIMEN HEMOLYZED. HEMOLYSIS MAY AFFECT INTEGRITY OF RESULTS.  ALT 52* 48* 88* RESULTS UNAVAILABLE DUE TO INTERFERING SUBSTANCE SPECIMEN HEMOLYZED. HEMOLYSIS MAY AFFECT INTEGRITY OF RESULTS.  ALKPHOS 151* 127* 153* 154* 152*  BILITOT 0.4 0.2* 3.0* 4.8* 5.1*  PROT 5.7* 5.3* >12.0* RESULTS UNAVAILABLE DUE TO INTERFERING SUBSTANCE SPECIMEN HEMOLYZED. HEMOLYSIS MAY AFFECT INTEGRITY OF RESULTS.  ALBUMIN 2.6* 2.6* 2.7* 2.7* 2.5*    CBG: Recent Labs  Lab 06/02/22 0300 06/02/22 0410 06/02/22 0514 06/02/22 0551 06/02/22 1008  GLUCAP 150* 139* 112* 92 120*     Recent Results (from the past 240 hour(s))  Urine Culture     Status: Abnormal   Collection Time: 05/29/22  9:19 AM   Specimen: Urine, Random  Result Value Ref Range Status   Specimen Description   Final    URINE, RANDOM Performed at [facility], [address] [place], [place] [address]    Special Requests   Final    NONE Reflexed from [id number] Performed at [facility], [address] [place], [place] [address]    Culture (A)  Final    >=100,000 COLONIES/mL ESCHERICHIA COLI Confirmed Extended Spectrum Beta-Lactamase Producer (ESBL).  In bloodstream infections from ESBL organisms, carbapenems are preferred over piperacillin/tazobactam. They are shown to have a lower risk of mortality. 80,000 COLONIES/mL ENTEROCOCCUS FAECALIS    Report Status 05/31/2022 FINAL  Final   Organism ID, Bacteria ESCHERICHIA COLI (A)  Final   Organism ID, Bacteria ENTEROCOCCUS FAECALIS (A)  Final      Susceptibility   Escherichia coli - MIC*    AMPICILLIN >=32 RESISTANT Resistant     CEFAZOLIN >=64 RESISTANT Resistant     CEFEPIME 2 SENSITIVE Sensitive     CEFTRIAXONE >=64 RESISTANT Resistant     CIPROFLOXACIN >=4 RESISTANT Resistant     GENTAMICIN <=1 SENSITIVE Sensitive     IMIPENEM <=0.25 SENSITIVE Sensitive     NITROFURANTOIN <=16 SENSITIVE Sensitive     TRIMETH/SULFA >=320 RESISTANT Resistant     AMPICILLIN/SULBACTAM 16 INTERMEDIATE Intermediate     PIP/TAZO <=4 SENSITIVE Sensitive     * >=100,000 COLONIES/mL ESCHERICHIA COLI   Enterococcus faecalis - MIC*    AMPICILLIN <=2 SENSITIVE Sensitive     NITROFURANTOIN <=16 SENSITIVE Sensitive     VANCOMYCIN 1 SENSITIVE Sensitive     * 80,000 COLONIES/mL ENTEROCOCCUS FAECALIS  MRSA Next Gen by PCR, Nasal     Status: None   Collection Time: 05/29/22  1:09 PM   Specimen: Nasal Mucosa; Nasal Swab  Result Value Ref Range Status   MRSA by PCR Next Gen NOT DETECTED NOT DETECTED Final    Comment: (NOTE) The GeneXpert MRSA Assay (FDA approved for NASAL specimens only), is one component of a comprehensive MRSA colonization surveillance program. It is not intended to diagnose MRSA infection nor to guide or monitor treatment for MRSA infections. Test performance is not FDA approved in patients less than 57 years old. Performed at [facility], [place] [address] [place], [place] [address]           Radiology Studies: CT ABDOMEN PELVIS WO CONTRAST  Result Date: 06/01/2022 CLINICAL DATA:  anemia, rule out retroperitoneal bleed or hematoma EXAM: CT ABDOMEN AND PELVIS WITHOUT CONTRAST TECHNIQUE: Multidetector CT imaging of the abdomen and pelvis was performed following the standard protocol without IV contrast. RADIATION DOSE REDUCTION: This exam was performed according to the departmental dose-optimization program which includes automated  exposure control, adjustment of the mA and/or kV according to patient size and/or use of iterative reconstruction technique. COMPARISON:  Chest XR, concurrent. CT AP, 05/29/2022 and 03/30/2022. CT CAP, 05/12/2022. FINDINGS: Lower chest: Cardiomegaly with LEFT ventricular enlargement, greater than expected in a patient of this age. Hypodensity of the cardiac blood pool. Ground-glass opacities and interlobular septal thickening at the lung bases. Trace LEFT pleural effusion. Hepatobiliary: Hepatomegaly. Diffuse hypodensity of liver. No gallstones, gallbladder wall thickening, or biliary dilatation. Pancreas: No pancreatic ductal dilatation or surrounding inflammatory changes. Spleen: Splenomegaly.  No focal abnormality. Adrenals/Urinary Tract: Adrenal glands are unremarkable. Kidneys are normal, without renal calculi, focal lesion, or hydronephrosis. Mild distention of the urinary bladder. Stomach/Bowel: Stomach is within normal limits. Appendix is not definitively visualized. No evidence of bowel wall thickening, distention, or inflammatory changes. Vascular/Lymphatic: Mild burden of distal aortic atherosclerosis, though greater than expected in a patient of this age. No enlarged abdominal or pelvic lymph nodes. Reproductive: Uterus and adnexa are unremarkable. Other: Rotund abdomen. Mild body wall edema. Multifocal anterior abdominal wall subcutaneous contusions, likely injection sites. Similar appearance of subcutaneous tissue thickening at the RIGHT groin and  months pubis. No abdominopelvic ascites. Musculoskeletal: No acute or significant osseous findings. IMPRESSION: 1. No acute abdominopelvic process. 2. Cardiomegaly with LEFT ventricular enlargement, greater than expected in a patient of this age. Hypodensity of the cardiac blood pool as can be seen with anemia. 3. Bibasilar ground-glass opacities and interlobular septal thickening, suspicious for developing pulmonary edema. 4. Hepatomegaly and steatosis. Additional incidental, chronic and senescent findings as above. Electronically Signed   By: Michaelle Birks M.D.   On: 06/01/2022 19:09   [place] EKG SITE RITE  Result Date: 06/01/2022 If Site Rite image not attached, placement could not be confirmed due to current cardiac rhythm.  DG Chest Port 1 View  Result Date: 06/01/2022 CLINICAL DATA:  Shortness of breath. EXAM: PORTABLE CHEST 1 VIEW COMPARISON:  May 31, 2022. FINDINGS: Stable cardiomediastinal silhouette. Minimal bibasilar subsegmental atelectasis is noted. Bony thorax is unremarkable. IMPRESSION: Minimal bibasilar subsegmental atelectasis. Electronically Signed   By: Marijo Conception M.D.   On: 06/01/2022 13:34   DG CHEST PORT 1 VIEW  Result Date: 05/31/2022 CLINICAL DATA:  Shortness of breath. Wheezing. EXAM: PORTABLE CHEST 1 VIEW COMPARISON:  Chest radiograph 05/12/2022. Lung bases from abdominal CT 05/29/2022 FINDINGS: Heart is normal in size. Stable mediastinal contours. There is mild diffuse peribronchial thickening. Question of trace left pleural effusion. Mild bibasilar atelectasis without confluent airspace disease. No pneumothorax. Limited assessment, no acute osseous findings. IMPRESSION: Mild diffuse peribronchial thickening, can be seen with asthma or bronchitis. Question trace left pleural effusion. Electronically Signed   By: Keith Rake M.D.   On: 05/31/2022 16:17        Scheduled Meds:  sodium chloride   Intravenous Once   aspirin EC  81 mg Oral Daily   carvedilol  3.125 mg  Oral BID WC   Chlorhexidine Gluconate Cloth  6 each Topical Daily   DULoxetine  30 mg Oral QHS   fenofibrate  160 mg Oral Daily   furosemide  40 mg Intravenous BID   heparin  5,000 Units Subcutaneous Q8H   ivabradine  7.5 mg Oral BID WC   levothyroxine  125 mcg Oral Q0600   lipase/protease/amylase  36,000 Units Oral TID   loratadine  10 mg Oral Daily   nitrofurantoin (macrocrystal-monohydrate)  100 mg Oral Q12H   pantoprazole  40 mg Oral Daily   pregabalin  300 mg Oral BID   rosuvastatin  40 mg Oral Daily   sildenafil  20 mg Oral TID   sodium bicarbonate  650 mg Oral BID   sodium chloride flush  10-40 mL Intracatheter Q12H   Continuous Infusions:  dextrose 5 % and 0.9% NaCl 75 mL/hr at 06/02/22 0800   insulin 0.2 Units/kg/hr (06/02/22 0800)     LOS: 4 days      Phillips Climes, MD [facility]   To contact the attending provider between 7A-7P or the covering provider during after hours 7P-7A, please log into the web site [email or web address] and access using universal [place] password for that web site. If you do not have the password, please call the hospital operator.  06/02/2022, 10:57 AM

## 2022-06-02 NOTE — Progress Notes (Signed)
Peripherally Inserted Central Catheter Placement  The IV Nurse has discussed with the patient and/or persons authorized to consent for the patient, the purpose of this procedure and the potential benefits and risks involved with this procedure.  The benefits include less needle sticks, lab draws from the catheter, and the patient may be discharged home with the catheter. Risks include, but not limited to, infection, bleeding, blood clot (thrombus formation), and puncture of an artery; nerve damage and irregular heartbeat and possibility to perform a PICC exchange if needed/ordered by physician.  Alternatives to this procedure were also discussed.  Bard Power PICC patient education guide, fact sheet on infection prevention and patient information card has been provided to patient /or left at bedside.    PICC Placement Documentation  PICC Double Lumen [id number] Right Basilic 35 cm 2 cm (Active)  Indication for Insertion or Continuance of Line Chronic illness with exacerbations (CF, Sickle Cell, etc.) 06/02/22 0839  Exposed Catheter (cm) 2 cm 06/02/22 0839  Site Assessment Clean, Dry, Intact 06/02/22 0839  Lumen #1 Status Saline locked;Flushed;Blood return noted 06/02/22 0839  Lumen #2 Status Flushed;Saline locked;Blood return noted 06/02/22 0839  Dressing Type Transparent;Securing device 06/02/22 0839  Dressing Status Antimicrobial disc in place 06/02/22 0839  Dressing Intervention New dressing;Other (Comment) 06/02/22 0839  Dressing Change Due 06/09/22 06/02/22 0839       Jennifer [name] 06/02/2022, 8:40 AM

## 2022-06-02 NOTE — Progress Notes (Signed)
Physical Therapy Treatment Patient Details Name: Jennifer [name] MRN: [id number] DOB: [birth year] Today's Date: 06/02/2022   History of Present Illness 32 yo female admitted 3/3 with abdominal pain and n/v. Pt with DKA and pancreatitis. PMH: T2DM, astrocytoma, lipoprotein deficiency, pancreatitis, HLD, pulmonary hypertension, CHF, fatty liver    PT Comments    Pt pleasant and able to progress gait distance this session but remains limited by fatigue, SOB and reliance on supplemental O2. At rest SpO2 84% and requiring 3L to maintain 89% with gait. Pt educated for use of pulse ox for home, walking program and HEP.     Recommendations for follow up therapy are one component of a multi-disciplinary discharge planning process, led by the attending physician.  Recommendations may be updated based on patient status, additional functional criteria and insurance authorization.  Follow Up Recommendations  No PT follow up     Assistance Recommended at Discharge Intermittent Supervision/Assistance  Patient can return home with the following Assist for transportation   Equipment Recommendations  None recommended by PT    Recommendations for Other Services       Precautions / Restrictions Precautions Precautions: Fall Precaution Comments: L drop foot from astrocytoma as a child has a brace but doesn't normally wear it. watch sats Restrictions Weight Bearing Restrictions: No     Mobility  Bed Mobility Overal bed mobility: Modified Independent             General bed mobility comments: bed flat with use of rail    Transfers Overall transfer level: Modified independent                      Ambulation/Gait Ambulation/Gait assistance: Supervision Gait Distance (Feet): 400 Feet Assistive device: None Gait Pattern/deviations: Step-to pattern, Decreased stride length, Decreased dorsiflexion - left   Gait velocity interpretation: <1.8 ft/sec, indicate of risk for recurrent  falls   General Gait Details: pt with 3 standing rests to recover sats with pt requiring increase from 2-3L during gait with inconsistent pleth but SpO2 88-93% on 3L and dropping to 85% on 2L. Cues for breathing technique and direction   Stairs             Wheelchair Mobility    Modified Rankin (Stroke Patients Only)       Balance Overall balance assessment: Mild deficits observed, not formally tested                                          Cognition Arousal/Alertness: Awake/alert Behavior During Therapy: WFL for tasks assessed/performed Overall Cognitive Status: Within Functional Limits for tasks assessed                                          Exercises General Exercises - Lower Extremity Long Arc Quad: AROM, Both, Seated, 20 reps Hip Flexion/Marching: AROM, Both, Seated, 20 reps    General Comments        Pertinent Vitals/Pain Pain Assessment Pain Assessment: No/denies pain    Home Living                          Prior Function            PT Goals (current goals can now be found  in the care plan section) Progress towards PT goals: Progressing toward goals    Frequency    Min 3X/week      PT Plan Current plan remains appropriate    Co-evaluation              AM-PAC PT "6 Clicks" Mobility   Outcome Measure  Help needed turning from your back to your side while in a flat bed without using bedrails?: None Help needed moving from lying on your back to sitting on the side of a flat bed without using bedrails?: None Help needed moving to and from a bed to a chair (including a wheelchair)?: None Help needed standing up from a chair using your arms (e.g., wheelchair or bedside chair)?: A Little Help needed to walk in hospital room?: A Little Help needed climbing 3-5 steps with a railing? : A Little 6 Click Score: 21    End of Session Equipment Utilized During Treatment: Oxygen Activity  Tolerance: Patient tolerated treatment well Patient left: in chair;with call bell/phone within reach Nurse Communication: Mobility status PT Visit Diagnosis: Unsteadiness on feet (R26.81);Difficulty in walking, not elsewhere classified (R26.2)     Time: [id number] PT Time Calculation (min) (ACUTE ONLY): 23 min  Charges:  $Gait Training: 8-22 mins $Therapeutic Activity: 8-22 mins                     Bayard Males, PT Acute Rehabilitation Services Office: [place] 06/02/2022, 10:44 AM

## 2022-06-02 NOTE — TOC Benefit Eligibility Note (Signed)
Patient Teacher, English as a foreign language completed.    The patient is currently admitted and upon discharge could be taking Repatha 140 ng/ml [place].  Requires Prior Authorization   The patient is insured through [employer] [place] [employer]   Lyndel Safe, [place] Patient Advocate Specialist [facility] Patient Advocate Team Direct Number: [phone]  Fax: [phone]

## 2022-06-02 NOTE — Progress Notes (Signed)
Dexcom blood sugar reading 80 prior to breakfast and 95 post meal for 0800 readings.

## 2022-06-02 NOTE — Consult Note (Signed)
Reason for Consult: Hypertriglyceridemia w/ Electrolyte derangements and AKI  Referring Physician: Dr. Phillips Climes  Chief Complaint: Electrolyte derangement  Assessment/Plan: Hypertriglyceridemia Patient has known hx of familial hypertriglyceridemia, notes her values can be as high as 25K. Patient believed to have poor medication compliance, is on Repatha, but has not had for 2 weeks. Patient report's getting plasmapheresis for hypertriglyceridemia in remote past for pancreatitis. Patient does not have pancreatitis on CT Abd this admission, w/ normal lipase, making pancreatitis less likely. Triglycerides elevated to >5 K on admission. Team considered plasmapheresis for this admission, but low evidence in efficacy. Will attempt to see if patient can have home Repatha brought in (patient was awaiting prior auth).  -Consider plasmapheresis  -F/u if patient to able to have Repatha brought in  -Insulin drip  -Continue Creon  -Continue fenofibrate 160 mg  -Continue Crestor 40 mg Pseudo Hyponatremia Patient Na likely low in setting of severely elevated triglycerides. Using Na Hypertriglyceridemia calculator, Na corrects by a factor of 10 meq, from 128 to 138.   -Continue to monitor  -Lower triglycerides per above AKI on CKD IV Baseline Cr around ~1.5 in December on 23, Cr on admission 1.65 trending down to 1.63 earlier this morning, w/ lab reporting 1.07 later today. Unsure etiology of lab discrepancy. Patient likely was volume depleted 2/2 DKA and had UTI on admission. Will continue to monitor.   -Continue to monitor  -Avoid nephrotoxic agents  Anemia Hgb 11.3 on admission and fell to 6.7, patient s/p transfusion x1 on 06/01/22. Hgb at 9.2 today. MCV normocytic this admission, anemia likely 2/2 to CKD  -CTM    HPI: Jennifer [name] is an 32 y.o. female w/ past medical history of astrocytoma, lipoprotein deficiency, pancreatitis, hypercholesterolemia, hyper triglyceridemia, pulmonary  hypertension status post CardioMEMS implantation, on Corlanor and sildenafil. Patient presented to hospital found to be in DKA, w/ UTI (E.coli and eterococcus faecalis), and hypertriglyceridemia w/o pancreatitis, and was admitted to ICU. She was treated for DKA and UTI and transferred to hospitalist service. On hospitalist service she was started on an insulin drip for her triglycerides.      Chemistry and CBC: Creatinine, Ser  Date/Time Value Ref Range Status  06/02/2022 06:20 AM 1.07 (H) 0.44 - 1.00 mg/dL Final  06/02/2022 02:08 AM 1.63 (H) 0.44 - 1.00 mg/dL Final    Comment:    POST-ULTRACENTRIFUGATION  06/01/2022 03:48 PM 1.74 (H) 0.44 - 1.00 mg/dL Final    Comment:    POST-ULTRACENTRIFUGATION  06/01/2022 10:38 AM 1.59 (H) 0.44 - 1.00 mg/dL Final    Comment:    POST-ULTRACENTRIFUGATION  05/31/2022 01:55 AM 1.32 (H) 0.44 - 1.00 mg/dL Final    Comment:    POST-ULTRACENTRIFUGATION  05/30/2022 07:05 PM 1.22 (H) 0.44 - 1.00 mg/dL Final    Comment:    POST-ULTRACENTRIFUGATION  05/30/2022 02:35 AM 1.61 (H) 0.44 - 1.00 mg/dL Final    Comment:    POST-ULTRACENTRIFUGATION  05/29/2022 10:45 PM 1.58 (H) 0.44 - 1.00 mg/dL Final    Comment:    POST-ULTRACENTRIFUGATION  05/29/2022 06:49 PM 1.80 (H) 0.44 - 1.00 mg/dL Final    Comment:    POST-ULTRACENTRIFUGATION  05/29/2022 01:53 PM 1.53 (H) 0.44 - 1.00 mg/dL Final  05/29/2022 07:15 AM 1.65 (H) 0.44 - 1.00 mg/dL Final  04/05/2022 10:09 AM 1.66 (H) 0.44 - 1.00 mg/dL Final  03/30/2022 03:40 AM 1.85 (H) 0.44 - 1.00 mg/dL Final  03/26/2022 11:28 PM 1.66 (H) 0.44 - 1.00 mg/dL Final  03/25/2022 06:07 AM 1.99 (H)  0.44 - 1.00 mg/dL Final  03/24/2022 12:46 AM 1.83 (H) 0.44 - 1.00 mg/dL Final  03/23/2022 01:06 PM 1.78 (H) 0.44 - 1.00 mg/dL Final  03/23/2022 01:50 AM 1.26 (H) 0.44 - 1.00 mg/dL Final    Comment:    POST-ULTRACENTRIFUGATION  03/20/2022 12:58 PM 1.55 (H) 0.44 - 1.00 mg/dL Final  03/18/2022 03:18 AM 2.29 (H) 0.44 - 1.00 mg/dL Final   03/17/2022 04:44 AM 2.80 (H) 0.44 - 1.00 mg/dL Final  03/16/2022 06:25 AM 3.21 (H) 0.44 - 1.00 mg/dL Final  03/15/2022 03:53 AM 2.48 (H) 0.44 - 1.00 mg/dL Final  03/14/2022 02:56 AM 1.87 (H) 0.44 - 1.00 mg/dL Final  03/04/2022 05:59 PM 1.85 (H) 0.44 - 1.00 mg/dL Final  02/13/2022 08:59 AM 2.63 (H) 0.44 - 1.00 mg/dL Final  02/12/2022 07:11 AM 3.40 (H) 0.44 - 1.00 mg/dL Final    Comment:    POST-ULTRACENTRIFUGATION  02/11/2022 04:36 AM 2.73 (H) 0.44 - 1.00 mg/dL Final  02/10/2022 04:48 AM 2.33 (H) 0.44 - 1.00 mg/dL Final  02/09/2022 04:49 AM 2.55 (H) 0.44 - 1.00 mg/dL Final    Comment:    POST-ULTRACENTRIFUGATION  02/08/2022 05:00 PM 2.48 (H) 0.44 - 1.00 mg/dL Final    Comment:    POST-ULTRACENTRIFUGATION  02/08/2022 04:40 AM 2.29 (H) 0.44 - 1.00 mg/dL Final  02/07/2022 05:29 AM 2.30 (H) 0.44 - 1.00 mg/dL Final  02/06/2022 07:05 PM 2.07 (H) 0.44 - 1.00 mg/dL Final    Comment:    POST-ULTRACENTRIFUGATION  02/06/2022 08:43 AM 2.19 (H) 0.44 - 1.00 mg/dL Final    Comment:    POST-ULTRACENTRIFUGATION  02/05/2022 11:10 PM 2.32 (H) 0.44 - 1.00 mg/dL Final    Comment:    POST-ULTRACENTRIFUGATION  02/03/2022 04:41 PM 1.99 (H) 0.44 - 1.00 mg/dL Final    Comment:    POST-ULTRACENTRIFUGATION  01/29/2022 11:27 PM 2.33 (H) 0.44 - 1.00 mg/dL Final  01/26/2022 10:49 AM 2.10 (H) 0.44 - 1.00 mg/dL Final  12/12/2021 12:52 PM 2.44 (H) 0.44 - 1.00 mg/dL Final  12/12/2021 01:53 AM 2.90 (H) 0.44 - 1.00 mg/dL Final    Comment:    POST-ULTRACENTRIFUGATION  12/11/2021 02:31 AM 3.23 (H) 0.44 - 1.00 mg/dL Final  12/10/2021 02:42 AM 2.39 (H) 0.44 - 1.00 mg/dL Final  12/09/2021 02:36 AM 2.38 (H) 0.44 - 1.00 mg/dL Final  12/08/2021 04:43 AM 2.29 (H) 0.44 - 1.00 mg/dL Final  12/07/2021 04:14 AM 3.33 (H) 0.44 - 1.00 mg/dL Final  12/06/2021 03:43 AM 2.48 (H) 0.44 - 1.00 mg/dL Final  12/05/2021 04:05 AM 2.50 (H) 0.44 - 1.00 mg/dL Final    Comment:    POST-ULTRACENTRIFUGATION  12/04/2021 02:33 PM 2.67 (H)  0.44 - 1.00 mg/dL Final  12/03/2021 10:23 AM 2.45 (H) 0.44 - 1.00 mg/dL Final  12/02/2021 12:19 PM 1.76 (H) 0.44 - 1.00 mg/dL Final  12/01/2021 08:11 PM 2.12 (H) 0.44 - 1.00 mg/dL Final   Recent Labs  Lab 05/30/22 0235 05/30/22 1905 05/31/22 0155 06/01/22 1038 06/01/22 1548 06/02/22 0208 06/02/22 0620 06/02/22 1009  NA 134* 136 133* 130* 133* 128* 121* 129*  K 4.2 3.1* 4.4 7.1* 5.8* SPECIMEN HEMOLYZED. HEMOLYSIS MAY AFFECT INTEGRITY OF RESULTS. SPECIMEN HEMOLYZED. HEMOLYSIS MAY AFFECT INTEGRITY OF RESULTS. 4.1  CL 103 108 103 98 97* 96* 92*  --   CO2 18* 13* 20* 21* 22 19* 23  --   GLUCOSE 313* 199* 163* 242* 256* 167* 69*  --   BUN 36* '19 18 18 19 20 20  '$ --   CREATININE  1.61* 1.22* 1.32* 1.59* 1.74* 1.63* 1.07*  --   CALCIUM 8.1* 6.6* 7.8* 8.4* 8.3* 7.8* 7.5*  --   PHOS 1.8* 3.1 3.6 2.7  --  2.3*  --   --    Recent Labs  Lab 05/29/22 0715 05/29/22 1400 06/01/22 0801 06/01/22 1510 06/01/22 1822 06/02/22 0208 06/02/22 1009  WBC 12.0*   < > 6.6 6.6 5.9 6.8  --   NEUTROABS 10.4*  --   --   --  3.8  --   --   HGB 11.1*   < > 7.7* 6.7* 7.6* 8.3* 9.2*  HCT 30.3*   < > 24.3* 21.8* 24.2* 24.4* 27.0*  MCV 86.6   < > 92.4 90.5 90.0 87.5  --   PLT 206   < > 134* 120* 158 174  --    < > = values in this interval not displayed.   Liver Function Tests: Recent Labs  Lab 06/01/22 1038 06/01/22 1822 06/02/22 0208  AST 172* RESULTS UNAVAILABLE DUE TO INTERFERING SUBSTANCE SPECIMEN HEMOLYZED. HEMOLYSIS MAY AFFECT INTEGRITY OF RESULTS.  ALT 88* RESULTS UNAVAILABLE DUE TO INTERFERING SUBSTANCE SPECIMEN HEMOLYZED. HEMOLYSIS MAY AFFECT INTEGRITY OF RESULTS.  ALKPHOS 153* 154* 152*  BILITOT 3.0* 4.8* 5.1*  PROT >12.0* RESULTS UNAVAILABLE DUE TO INTERFERING SUBSTANCE SPECIMEN HEMOLYZED. HEMOLYSIS MAY AFFECT INTEGRITY OF RESULTS.  ALBUMIN 2.7* 2.7* 2.5*   Recent Labs  Lab 05/29/22 0715 05/30/22 0235 06/01/22 1822  LIPASE 76* 21 26   No results for input(s): "AMMONIA" in the last  168 hours. Cardiac Enzymes: No results for input(s): "CKTOTAL", "CKMB", "CKMBINDEX", "TROPONINI" in the last 168 hours. Iron Studies: No results for input(s): "IRON", "TIBC", "TRANSFERRIN", "FERRITIN" in the last 72 hours. PT/INR: '@LABRCNTIP'$ (inr:5)  Xrays/Other Studies: ) Results for orders placed or performed during the hospital encounter of 05/29/22 (from the past 48 hour(s))  Glucose, capillary     Status: Abnormal   Collection Time: 05/31/22 10:15 PM  Result Value Ref Range   Glucose-Capillary 64 (L) 70 - 99 mg/dL    Comment: Glucose reference range applies only to samples taken after fasting for at least 8 hours.  Glucose, capillary     Status: None   Collection Time: 05/31/22 10:31 PM  Result Value Ref Range   Glucose-Capillary 85 70 - 99 mg/dL    Comment: Glucose reference range applies only to samples taken after fasting for at least 8 hours.  Glucose, capillary     Status: Abnormal   Collection Time: 05/31/22 11:25 PM  Result Value Ref Range   Glucose-Capillary 142 (H) 70 - 99 mg/dL    Comment: Glucose reference range applies only to samples taken after fasting for at least 8 hours.  Glucose, capillary     Status: Abnormal   Collection Time: 06/01/22 12:54 AM  Result Value Ref Range   Glucose-Capillary 132 (H) 70 - 99 mg/dL    Comment: Glucose reference range applies only to samples taken after fasting for at least 8 hours.  Glucose, capillary     Status: Abnormal   Collection Time: 06/01/22  2:33 AM  Result Value Ref Range   Glucose-Capillary 103 (H) 70 - 99 mg/dL    Comment: Glucose reference range applies only to samples taken after fasting for at least 8 hours.  Triglycerides     Status: Abnormal   Collection Time: 06/01/22  4:06 AM  Result Value Ref Range   Triglycerides >5,000 (H) <150 mg/dL    Comment: RESULT CONFIRMED BY MANUAL DILUTION  Performed at [facility], [place] [address], [place], [place] [address]   CBC     Status: Abnormal   Collection Time:  06/01/22  4:06 AM  Result Value Ref Range   WBC 7.6 4.0 - 10.5 K/uL   RBC 2.83 (L) 3.87 - 5.11 MIL/uL   Hemoglobin 10.8 (L) 12.0 - 15.0 g/dL    Comment: REPEATED TO VERIFY NO BLOOD PROUDUCTS GIVEN DELTA NOTED    HCT 25.0 (L) 36.0 - 46.0 %   MCV 88.3 80.0 - 100.0 fL   MCH 38.2 (H) 26.0 - 34.0 pg   MCHC 43.2 (H) 30.0 - 36.0 g/dL   RDW 17.2 (H) 11.5 - 15.5 %   Platelets 123 (L) 150 - 400 K/uL   nRBC 0.0 0.0 - 0.2 %    Comment: Performed at [facility], [place] [address], [place], [place] [address]  Glucose, capillary     Status: None   Collection Time: 06/01/22  5:02 AM  Result Value Ref Range   Glucose-Capillary 81 70 - 99 mg/dL    Comment: Glucose reference range applies only to samples taken after fasting for at least 8 hours.  Glucose, capillary     Status: None   Collection Time: 06/01/22  5:19 AM  Result Value Ref Range   Glucose-Capillary 84 70 - 99 mg/dL    Comment: Glucose reference range applies only to samples taken after fasting for at least 8 hours.  CBC     Status: Abnormal   Collection Time: 06/01/22  8:01 AM  Result Value Ref Range   WBC 6.6 4.0 - 10.5 K/uL   RBC 2.63 (L) 3.87 - 5.11 MIL/uL   Hemoglobin 7.7 (L) 12.0 - 15.0 g/dL   HCT 24.3 (L) 36.0 - 46.0 %   MCV 92.4 80.0 - 100.0 fL   MCH 29.3 26.0 - 34.0 pg   MCHC 31.7 30.0 - 36.0 g/dL    Comment: CORRECTED FOR LIPEMIA   RDW 18.1 (H) 11.5 - 15.5 %   Platelets 134 (L) 150 - 400 K/uL    Comment: Immature Platelet Fraction may be clinically indicated, consider ordering this additional test [id number] REPEATED TO VERIFY    nRBC 0.7 (H) 0.0 - 0.2 %    Comment: Performed at [facility], [place] [address] [place], [place] [address]  Magnesium     Status: None   Collection Time: 06/01/22 10:38 AM  Result Value Ref Range   Magnesium 1.9 1.7 - 2.4 mg/dL    Comment: POST-ULTRACENTRIFUGATION Performed at [facility], [place] [address] [place], [place] [address]   Phosphorus     Status: None    Collection Time: 06/01/22 10:38 AM  Result Value Ref Range   Phosphorus 2.7 2.5 - 4.6 mg/dL    Comment: POST-ULTRACENTRIFUGATION Performed at [facility] [address], [place], [place] [address]   Comprehensive metabolic panel     Status: Abnormal   Collection Time: 06/01/22 10:38 AM  Result Value Ref Range   Sodium 130 (L) 135 - 145 mmol/L    Comment: POST-ULTRACENTRIFUGATION   Potassium 7.1 (HH) 3.5 - 5.1 mmol/L    Comment: CRITICAL RESULT CALLED TO, READ BACK BY AND VERIFIED WITH [employer], RN 1240 06/01/22 L. KLAR HEMOLYSIS AT THIS LEVEL MAY AFFECT RESULT POST-ULTRACENTRIFUGATION    Chloride 98 98 - 111 mmol/L    Comment: POST-ULTRACENTRIFUGATION   CO2 21 (L) 22 - 32 mmol/L    Comment:  POST-ULTRACENTRIFUGATION   Glucose, Bld 242 (H) 70 - 99 mg/dL    Comment: Glucose reference range applies only to samples taken after fasting for at least 8 hours. POST-ULTRACENTRIFUGATION    BUN 18 6 - 20 mg/dL    Comment: POST-ULTRACENTRIFUGATION   Creatinine, Ser 1.59 (H) 0.44 - 1.00 mg/dL    Comment: POST-ULTRACENTRIFUGATION   Calcium 8.4 (L) 8.9 - 10.3 mg/dL    Comment: POST-ULTRACENTRIFUGATION   Total Protein >12.0 (H) 6.5 - 8.1 g/dL    Comment: THIS IS THE SECOND RECOLLECT. POST-ULTRACENTRIFUGATION    Albumin 2.7 (L) 3.5 - 5.0 g/dL    Comment: POST-ULTRACENTRIFUGATION   AST 172 (H) 15 - 41 U/L    Comment: POST-ULTRACENTRIFUGATION   ALT 88 (H) 0 - 44 U/L    Comment: POST-ULTRACENTRIFUGATION   Alkaline Phosphatase 153 (H) 38 - 126 U/L    Comment: POST-ULTRACENTRIFUGATION   Total Bilirubin 3.0 (H) 0.3 - 1.2 mg/dL    Comment: POST-ULTRACENTRIFUGATION   GFR, Estimated 44 (L) >60 mL/min    Comment: POST-ULTRACENTRIFUGATION (NOTE) Calculated using the CKD-EPI Creatinine Equation (2021)    Anion gap 11 5 - 15    Comment: POST-ULTRACENTRIFUGATION Performed at [facility] [address] [place], [place] [address]   Glucose, capillary     Status: Abnormal    Collection Time: 06/01/22 11:43 AM  Result Value Ref Range   Glucose-Capillary 286 (H) 70 - 99 mg/dL    Comment: Glucose reference range applies only to samples taken after fasting for at least 8 hours.  Glucose, capillary     Status: Abnormal   Collection Time: 06/01/22  1:12 PM  Result Value Ref Range   Glucose-Capillary 297 (H) 70 - 99 mg/dL    Comment: Glucose reference range applies only to samples taken after fasting for at least 8 hours.  Glucose, capillary     Status: Abnormal   Collection Time: 06/01/22  1:47 PM  Result Value Ref Range   Glucose-Capillary 276 (H) 70 - 99 mg/dL    Comment: Glucose reference range applies only to samples taken after fasting for at least 8 hours.  CBC     Status: Abnormal   Collection Time: 06/01/22  3:10 PM  Result Value Ref Range   WBC 6.6 4.0 - 10.5 K/uL   RBC 2.41 (L) 3.87 - 5.11 MIL/uL   Hemoglobin 6.7 (LL) 12.0 - 15.0 g/dL    Comment: REPEATED TO VERIFY THIS CRITICAL RESULT HAS VERIFIED AND BEEN CALLED TO C MCGUINESS RN BY BONNIE DAVIS ON 03 06 2024 AT 1649, AND HAS BEEN READ BACK.     HCT 21.8 (L) 36.0 - 46.0 %   MCV 90.5 80.0 - 100.0 fL   MCH 27.8 26.0 - 34.0 pg   MCHC 30.7 30.0 - 36.0 g/dL    Comment: CORRECTED FOR LIPEMIA   RDW 17.7 (H) 11.5 - 15.5 %   Platelets 120 (L) 150 - 400 K/uL   nRBC 3.1 (H) 0.0 - 0.2 %    Comment: Performed at [facility] [address], [place], [place] [address]  Troponin I (High Sensitivity)     Status: None   Collection Time: 06/01/22  3:10 PM  Result Value Ref Range   Troponin I (High Sensitivity) 16 <18 ng/L    Comment: (NOTE) Elevated high sensitivity troponin I (hsTnI) values and significant  changes across serial measurements may suggest ACS but many other  chronic and acute conditions are known to elevate hsTnI  results.  Refer to the "Links" section for chest pain algorithms and additional  guidance. Performed at [facility], [place] [address] [place], [place] [address]    Glucose, capillary     Status: Abnormal   Collection Time: 06/01/22  3:17 PM  Result Value Ref Range   Glucose-Capillary 245 (H) 70 - 99 mg/dL    Comment: Glucose reference range applies only to samples taken after fasting for at least 8 hours.  Basic metabolic panel     Status: Abnormal   Collection Time: 06/01/22  3:48 PM  Result Value Ref Range   Sodium 133 (L) 135 - 145 mmol/L    Comment: POST-ULTRACENTRIFUGATION   Potassium 5.8 (H) 3.5 - 5.1 mmol/L    Comment: POST-ULTRACENTRIFUGATION HEMOLYSIS AT THIS LEVEL MAY AFFECT RESULT DELTA CHECK NOTED    Chloride 97 (L) 98 - 111 mmol/L    Comment: POST-ULTRACENTRIFUGATION   CO2 22 22 - 32 mmol/L    Comment: POST-ULTRACENTRIFUGATION   Glucose, Bld 256 (H) 70 - 99 mg/dL    Comment: Glucose reference range applies only to samples taken after fasting for at least 8 hours. POST-ULTRACENTRIFUGATION    BUN 19 6 - 20 mg/dL    Comment: POST-ULTRACENTRIFUGATION   Creatinine, Ser 1.74 (H) 0.44 - 1.00 mg/dL    Comment: POST-ULTRACENTRIFUGATION   Calcium 8.3 (L) 8.9 - 10.3 mg/dL    Comment: POST-ULTRACENTRIFUGATION   GFR, Estimated 40 (L) >60 mL/min    Comment: (NOTE) Calculated using the CKD-EPI Creatinine Equation (2021)    Anion gap 14 5 - 15    Comment: POST-ULTRACENTRIFUGATION Performed at [facility] [address] [place], [place] [address]   Glucose, capillary     Status: Abnormal   Collection Time: 06/01/22  4:08 PM  Result Value Ref Range   Glucose-Capillary 226 (H) 70 - 99 mg/dL    Comment: Glucose reference range applies only to samples taken after fasting for at least 8 hours.  Glucose, capillary     Status: Abnormal   Collection Time: 06/01/22  5:06 PM  Result Value Ref Range   Glucose-Capillary 252 (H) 70 - 99 mg/dL    Comment: Glucose reference range applies only to samples taken after fasting for at least 8 hours.  Type and screen Mariano Colon     Status: None   Collection Time: 06/01/22   6:15 PM  Result Value Ref Range   ABO/RH(D) A POS    Antibody Screen NEG    Sample Expiration 06/04/2022,2359    Unit Number [id number]    Blood Component Type RBC LR PHER2    Unit division 00    Status of Unit ISSUED,FINAL    Transfusion Status OK TO TRANSFUSE    Crossmatch Result      Compatible Performed at [facility], [place] [address] [place], [place] [address]   Glucose, capillary     Status: Abnormal   Collection Time: 06/01/22  6:21 PM  Result Value Ref Range   Glucose-Capillary 246 (H) 70 - 99 mg/dL    Comment: Glucose reference range applies only to samples taken after fasting for at least 8 hours.  Troponin I (High Sensitivity)     Status: None   Collection Time: 06/01/22  6:22 PM  Result Value Ref Range   Troponin I (High Sensitivity) 14 <18 ng/L    Comment: (NOTE) Elevated high sensitivity troponin I (hsTnI) values and significant  changes across serial measurements may suggest  ACS but many other  chronic and acute conditions are known to elevate hsTnI results.  Refer to the Links section for chest pain algorithms and additional  guidance. Performed at [facility], [place] [address], [place], [place] [address]   Lactate dehydrogenase     Status: Abnormal   Collection Time: 06/01/22  6:22 PM  Result Value Ref Range   LDH 1,528 (H) 98 - 192 U/L    Comment: POST-ULTRACENTRIFUGATION Performed at [facility], [place] [address] [place], [place] [address]   Differential     Status: None   Collection Time: 06/01/22  6:22 PM  Result Value Ref Range   Neutrophils Relative % 63 %   Neutro Abs 3.8 1.7 - 7.7 K/uL   Lymphocytes Relative 25 %   Lymphs Abs 1.5 0.7 - 4.0 K/uL   Monocytes Relative 6 %   Monocytes Absolute 0.4 0.1 - 1.0 K/uL   Eosinophils Relative 4 %   Eosinophils Absolute 0.2 0.0 - 0.5 K/uL   Basophils Relative 1 %   Basophils Absolute 0.0 0.0 - 0.1 K/uL   Immature Granulocytes 1 %   Abs Immature Granulocytes 0.05 0.00 - 0.07 K/uL    Reactive, Benign Lymphocytes PRESENT    Stomatocytes PRESENT    Giant PLTs PRESENT     Comment: Performed at [facility], [place] [address], [place], [place] [address]  Hepatic function panel     Status: Abnormal   Collection Time: 06/01/22  6:22 PM  Result Value Ref Range   Total Protein RESULTS UNAVAILABLE DUE TO INTERFERING SUBSTANCE 6.5 - 8.1 g/dL    Comment: CALLED TO D [facility] 2115 06/01/2022 WBOND   Albumin 2.7 (L) 3.5 - 5.0 g/dL    Comment: POST-ULTRACENTRIFUGATION   AST RESULTS UNAVAILABLE DUE TO INTERFERING SUBSTANCE 15 - 41 U/L    Comment: CALLED TO D FIKES,RN 2115 06/01/2022 WBOND   ALT RESULTS UNAVAILABLE DUE TO INTERFERING SUBSTANCE 0 - 44 U/L    Comment: CALLED TO D FIKES,RN 2115 06/01/2022 WBOND   Alkaline Phosphatase 154 (H) 38 - 126 U/L    Comment: POST-ULTRACENTRIFUGATION   Total Bilirubin 4.8 (H) 0.3 - 1.2 mg/dL    Comment: RESULTS CONFIRMED BY MANUAL DILUTION   Bilirubin, Direct 2.5 (H) 0.0 - 0.2 mg/dL    Comment: RESULTS CONFIRMED BY MANUAL DILUTION   Indirect Bilirubin 2.3 (H) 0.3 - 0.9 mg/dL    Comment: Performed at [facility] [address] [place], [place] [address]  Lipase, blood     Status: None   Collection Time: 06/01/22  6:22 PM  Result Value Ref Range   Lipase 26 11 - 51 U/L    Comment: POST-ULTRACENTRIFUGATION Performed at [facility] [address], [place], [place] [address]   Beta-hydroxybutyric acid     Status: None   Collection Time: 06/01/22  6:22 PM  Result Value Ref Range   Beta-Hydroxybutyric Acid 0.26 0.05 - 0.27 mmol/L    Comment: Performed at [facility] [address], [place], [place] [address]  CBC     Status: Abnormal   Collection Time: 06/01/22  6:22 PM  Result Value Ref Range   WBC 5.9 4.0 - 10.5 K/uL   RBC 2.69 (L) 3.87 - 5.11 MIL/uL   Hemoglobin 7.6 (L) 12.0 - 15.0 g/dL    Comment: REPEATED TO VERIFY CORRECTED FOR LIPEMIA    HCT 24.2 (L) 36.0 - 46.0 %   MCV 90.0  80.0 - 100.0 fL   MCH 28.3  26.0 - 34.0 pg   MCHC 31.4 30.0 - 36.0 g/dL    Comment: REPEATED TO VERIFY CORRECTED FOR LIPEMIA    RDW 18.1 (H) 11.5 - 15.5 %   Platelets 158 150 - 400 K/uL    Comment: REPEATED TO VERIFY   nRBC 0.0 0.0 - 0.2 %    Comment: Performed at [facility], [place] [address] [place], [place] [address]  Technologist smear review     Status: None   Collection Time: 06/01/22  6:24 PM  Result Value Ref Range   WBC MORPHOLOGY ATYPICAL MONONUCLEAR CELLS    RBC MORPHOLOGY STOMATOCYTES    Plt Morphology PLATELETS APPEAR ADEQUATE     Comment: GIANT PLATELETS SEEN   Clinical Information anemia, rule out hemolysis     Comment: Performed at [facility], [place] [address], [place], [place] [address]  Prepare RBC (crossmatch)     Status: None   Collection Time: 06/01/22  6:24 PM  Result Value Ref Range   Order Confirmation      ORDER PROCESSED BY BLOOD BANK Performed at [facility], [place] [address] [place], [place] [address]   Glucose, capillary     Status: Abnormal   Collection Time: 06/01/22  7:50 PM  Result Value Ref Range   Glucose-Capillary 244 (H) 70 - 99 mg/dL    Comment: Glucose reference range applies only to samples taken after fasting for at least 8 hours.  Glucose, capillary     Status: Abnormal   Collection Time: 06/01/22  9:02 PM  Result Value Ref Range   Glucose-Capillary 257 (H) 70 - 99 mg/dL    Comment: Glucose reference range applies only to samples taken after fasting for at least 8 hours.  Glucose, capillary     Status: Abnormal   Collection Time: 06/01/22 11:11 PM  Result Value Ref Range   Glucose-Capillary 243 (H) 70 - 99 mg/dL    Comment: Glucose reference range applies only to samples taken after fasting for at least 8 hours.  Glucose, capillary     Status: Abnormal   Collection Time: 06/02/22 12:17 AM  Result Value Ref Range   Glucose-Capillary 181 (H) 70 - 99 mg/dL    Comment: Glucose reference range applies only to samples taken after fasting for at  least 8 hours.  Glucose, capillary     Status: Abnormal   Collection Time: 06/02/22  1:18 AM  Result Value Ref Range   Glucose-Capillary 174 (H) 70 - 99 mg/dL    Comment: Glucose reference range applies only to samples taken after fasting for at least 8 hours.  Glucose, capillary     Status: Abnormal   Collection Time: 06/02/22  1:55 AM  Result Value Ref Range   Glucose-Capillary 176 (H) 70 - 99 mg/dL    Comment: Glucose reference range applies only to samples taken after fasting for at least 8 hours.  CBC     Status: Abnormal   Collection Time: 06/02/22  2:08 AM  Result Value Ref Range   WBC 6.8 4.0 - 10.5 K/uL    Comment: WHITE COUNT CONFIRMED ON SMEAR   RBC 2.79 (L) 3.87 - 5.11 MIL/uL   Hemoglobin 8.3 (L) 12.0 - 15.0 g/dL    Comment: CORRECTED FOR LIPEMIA   HCT 24.4 (L) 36.0 - 46.0 %   MCV 87.5 80.0 - 100.0 fL   MCH 29.7 26.0 - 34.0 pg   MCHC  34.0 30.0 - 36.0 g/dL    Comment: CORRECTED FOR LIPEMIA   RDW 17.3 (H) 11.5 - 15.5 %   Platelets 174 150 - 400 K/uL   nRBC 0.0 0.0 - 0.2 %    Comment: Performed at [facility] [address] [place], [place] [address]  Magnesium     Status: None   Collection Time: 06/02/22  2:08 AM  Result Value Ref Range   Magnesium 2.0 1.7 - 2.4 mg/dL    Comment: POST-ULTRACENTRIFUGATION Performed at [facility], [place] [address] [place], [place] [address]   Phosphorus     Status: Abnormal   Collection Time: 06/02/22  2:08 AM  Result Value Ref Range   Phosphorus 2.3 (L) 2.5 - 4.6 mg/dL    Comment: POST-ULTRACENTRIFUGATION Performed at [facility], [place] [address], [place], [place] [address]   Comprehensive metabolic panel     Status: Abnormal   Collection Time: 06/02/22  2:08 AM  Result Value Ref Range   Sodium 128 (L) 135 - 145 mmol/L    Comment: POST-ULTRACENTRIFUGATION   Potassium  3.5 - 5.1 mmol/L    SPECIMEN HEMOLYZED. HEMOLYSIS MAY AFFECT INTEGRITY OF RESULTS.    Comment: SPOKE TO J.SKIEF,RN. 0320 06/02/22    Chloride 96 (L) 98 - 111 mmol/L    Comment: POST-ULTRACENTRIFUGATION   CO2 19 (L) 22 - 32 mmol/L    Comment: POST-ULTRACENTRIFUGATION   Glucose, Bld 167 (H) 70 - 99 mg/dL    Comment: POST-ULTRACENTRIFUGATION Glucose reference range applies only to samples taken after fasting for at least 8 hours.    BUN 20 6 - 20 mg/dL    Comment: POST-ULTRACENTRIFUGATION   Creatinine, Ser 1.63 (H) 0.44 - 1.00 mg/dL    Comment: POST-ULTRACENTRIFUGATION   Calcium 7.8 (L) 8.9 - 10.3 mg/dL    Comment: POST-ULTRACENTRIFUGATION   Total Protein  6.5 - 8.1 g/dL    SPECIMEN HEMOLYZED. HEMOLYSIS MAY AFFECT INTEGRITY OF RESULTS.    Comment: SPOKE TO J.SKIEF,RN. 0320 06/02/22   Albumin 2.5 (L) 3.5 - 5.0 g/dL    Comment: POST-ULTRACENTRIFUGATION   AST  15 - 41 U/L    SPECIMEN HEMOLYZED. HEMOLYSIS MAY AFFECT INTEGRITY OF RESULTS.    Comment: SPOKE TO J.SKIEF,RN. 0320 06/02/22   ALT  0 - 44 U/L    SPECIMEN HEMOLYZED. HEMOLYSIS MAY AFFECT INTEGRITY OF RESULTS.    Comment: SPOKE TO J.SKIEF,RN. 0320 06/02/22   Alkaline Phosphatase 152 (H) 38 - 126 U/L    Comment: POST-ULTRACENTRIFUGATION   Total Bilirubin 5.1 (H) 0.3 - 1.2 mg/dL    Comment: POST-ULTRACENTRIFUGATION   GFR, Estimated 43 (L) >60 mL/min    Comment: (NOTE) Calculated using the CKD-EPI Creatinine Equation (2021)    Anion gap 13 5 - 15    Comment: POST-ULTRACENTRIFUGATION Performed at [facility] [address] [place], [place] [address]   Triglycerides     Status: Abnormal   Collection Time: 06/02/22  2:08 AM  Result Value Ref Range   Triglycerides >5,000 (H) <150 mg/dL    Comment: RESULT CONFIRMED BY MANUAL DILUTION Performed at [facility], [place] [address], [place], [place] [address]   Glucose, capillary     Status: Abnormal   Collection Time: 06/02/22  3:00 AM  Result Value Ref Range   Glucose-Capillary 150 (H) 70 - 99 mg/dL    Comment: Glucose reference range applies only to samples taken after fasting for at least 8 hours.   Glucose, capillary  Status: Abnormal   Collection Time: 06/02/22  4:10 AM  Result Value Ref Range   Glucose-Capillary 139 (H) 70 - 99 mg/dL    Comment: Glucose reference range applies only to samples taken after fasting for at least 8 hours.  Glucose, capillary     Status: Abnormal   Collection Time: 06/02/22  5:14 AM  Result Value Ref Range   Glucose-Capillary 112 (H) 70 - 99 mg/dL    Comment: Glucose reference range applies only to samples taken after fasting for at least 8 hours.  Glucose, capillary     Status: None   Collection Time: 06/02/22  5:51 AM  Result Value Ref Range   Glucose-Capillary 92 70 - 99 mg/dL    Comment: Glucose reference range applies only to samples taken after fasting for at least 8 hours.   Comment 1 QC Due   Direct antiglobulin test (not at [facility])     Status: None   Collection Time: 06/02/22  6:14 AM  Result Value Ref Range   DAT, complement NEG    DAT, IgG      NEG Performed at [facility] [address] [place], [place] [id number]   Basic metabolic panel     Status: Abnormal   Collection Time: 06/02/22  6:20 AM  Result Value Ref Range   Sodium 121 (L) 135 - 145 mmol/L    Comment: DELTA CHECK NOTED   Potassium  3.5 - 5.1 mmol/L    SPECIMEN HEMOLYZED. HEMOLYSIS MAY AFFECT INTEGRITY OF RESULTS.    Comment: SPOKE TO Verdie Drown, RN 0730 06/02/22 L.KLAR   Chloride 92 (L) 98 - 111 mmol/L   CO2 23 22 - 32 mmol/L   Glucose, Bld 69 (L) 70 - 99 mg/dL    Comment: Glucose reference range applies only to samples taken after fasting for at least 8 hours.   BUN 20 6 - 20 mg/dL   Creatinine, Ser 1.07 (H) 0.44 - 1.00 mg/dL   Calcium 7.5 (L) 8.9 - 10.3 mg/dL   GFR, Estimated >60 >60 mL/min    Comment: (NOTE) Calculated using the CKD-EPI Creatinine Equation (2021)    Anion gap 6 5 - 15    Comment: Performed at [facility] [address], [place], [place] [address]  Glucose, capillary     Status: Abnormal   Collection Time: 06/02/22 10:08 AM   Result Value Ref Range   Glucose-Capillary 120 (H) 70 - 99 mg/dL    Comment: Glucose reference range applies only to samples taken after fasting for at least 8 hours.  POCT I-Stat EG7     Status: Abnormal   Collection Time: 06/02/22 10:09 AM  Result Value Ref Range   pH, Ven 7.364 7.25 - 7.43   pCO2, Ven 42.6 (L) 44 - 60 mmHg   pO2, Ven 21 (LL) 32 - 45 mmHg   Bicarbonate 24.4 20.0 - 28.0 mmol/L   TCO2 26 22 - 32 mmol/L   O2 Saturation 33 %   Acid-base deficit 1.0 0.0 - 2.0 mmol/L   Sodium 129 (L) 135 - 145 mmol/L   Potassium 4.1 3.5 - 5.1 mmol/L   Calcium, Ion 1.15 1.15 - 1.40 mmol/L   HCT 27.0 (L) 36.0 - 46.0 %   Hemoglobin 9.2 (L) 12.0 - 15.0 g/dL   Patient temperature 98.1 F    Sample type VENOUS    CT ABDOMEN PELVIS WO CONTRAST  Result Date: 06/01/2022 CLINICAL DATA:  anemia, rule out retroperitoneal bleed  or hematoma EXAM: CT ABDOMEN AND PELVIS WITHOUT CONTRAST TECHNIQUE: Multidetector CT imaging of the abdomen and pelvis was performed following the standard protocol without IV contrast. RADIATION DOSE REDUCTION: This exam was performed according to the departmental dose-optimization program which includes automated exposure control, adjustment of the mA and/or kV according to patient size and/or use of iterative reconstruction technique. COMPARISON:  Chest XR, concurrent. CT AP, 05/29/2022 and 03/30/2022. CT CAP, 05/12/2022. FINDINGS: Lower chest: Cardiomegaly with LEFT ventricular enlargement, greater than expected in a patient of this age. Hypodensity of the cardiac blood pool. Ground-glass opacities and interlobular septal thickening at the lung bases. Trace LEFT pleural effusion. Hepatobiliary: Hepatomegaly. Diffuse hypodensity of liver. No gallstones, gallbladder wall thickening, or biliary dilatation. Pancreas: No pancreatic ductal dilatation or surrounding inflammatory changes. Spleen: Splenomegaly.  No focal abnormality. Adrenals/Urinary Tract: Adrenal glands are unremarkable.  Kidneys are normal, without renal calculi, focal lesion, or hydronephrosis. Mild distention of the urinary bladder. Stomach/Bowel: Stomach is within normal limits. Appendix is not definitively visualized. No evidence of bowel wall thickening, distention, or inflammatory changes. Vascular/Lymphatic: Mild burden of distal aortic atherosclerosis, though greater than expected in a patient of this age. No enlarged abdominal or pelvic lymph nodes. Reproductive: Uterus and adnexa are unremarkable. Other: Rotund abdomen. Mild body wall edema. Multifocal anterior abdominal wall subcutaneous contusions, likely injection sites. Similar appearance of subcutaneous tissue thickening at the RIGHT groin and months pubis. No abdominopelvic ascites. Musculoskeletal: No acute or significant osseous findings. IMPRESSION: 1. No acute abdominopelvic process. 2. Cardiomegaly with LEFT ventricular enlargement, greater than expected in a patient of this age. Hypodensity of the cardiac blood pool as can be seen with anemia. 3. Bibasilar ground-glass opacities and interlobular septal thickening, suspicious for developing pulmonary edema. 4. Hepatomegaly and steatosis. Additional incidental, chronic and senescent findings as above. Electronically Signed   By: Michaelle Birks M.D.   On: 06/01/2022 19:09   [place] EKG SITE RITE  Result Date: 06/01/2022 If Site Rite image not attached, placement could not be confirmed due to current cardiac rhythm.  DG Chest Port 1 View  Result Date: 06/01/2022 CLINICAL DATA:  Shortness of breath. EXAM: PORTABLE CHEST 1 VIEW COMPARISON:  May 31, 2022. FINDINGS: Stable cardiomediastinal silhouette. Minimal bibasilar subsegmental atelectasis is noted. Bony thorax is unremarkable. IMPRESSION: Minimal bibasilar subsegmental atelectasis. Electronically Signed   By: Marijo Conception M.D.   On: 06/01/2022 13:34   DG CHEST PORT 1 VIEW  Result Date: 05/31/2022 CLINICAL DATA:  Shortness of breath. Wheezing. EXAM: PORTABLE  CHEST 1 VIEW COMPARISON:  Chest radiograph 05/12/2022. Lung bases from abdominal CT 05/29/2022 FINDINGS: Heart is normal in size. Stable mediastinal contours. There is mild diffuse peribronchial thickening. Question of trace left pleural effusion. Mild bibasilar atelectasis without confluent airspace disease. No pneumothorax. Limited assessment, no acute osseous findings. IMPRESSION: Mild diffuse peribronchial thickening, can be seen with asthma or bronchitis. Question trace left pleural effusion. Electronically Signed   By: Keith Rake M.D.   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   CHF (congestive heart failure) ([facility])    Cholesterosis    DM (diabetes mellitus) ([facility]) 10/10/2018   Fatty liver    HTN (hypertension) 10/10/2018   Hypothyroidism 10/10/2018   Lipoprotein deficiency    Lung disease    longevity long term   Pancreatitis    Polycystic ovary syndrome     PSH:   Past Surgical  History:  Procedure Laterality Date   ABDOMINAL SURGERY     pt states during miscarriage got her intestine   BRAIN SURGERY     EYE MUSCLE SURGERY Right 03/28/2014   PRESSURE SENSOR/CARDIOMEMS N/A 12/06/2021   Procedure: PRESSURE SENSOR/CARDIOMEMS;  Surgeon: [name], MD;  Location: [facility];  Service: Cardiovascular;  Laterality: N/A;   RIGHT HEART CATH N/A 08/05/2021   Procedure: RIGHT HEART CATH;  Surgeon: [name], MD;  Location: [facility];  Service: Cardiovascular;  Laterality: N/A;   RIGHT HEART CATH N/A 12/06/2021   Procedure: RIGHT HEART CATH;  Surgeon: [name], MD;  Location: [facility];  Service: Cardiovascular;  Laterality: N/A;   RIGHT/LEFT HEART CATH AND CORONARY ANGIOGRAPHY N/A 12/03/2020   Procedure: RIGHT/LEFT HEART CATH AND CORONARY ANGIOGRAPHY;  Surgeon: [name], MD;  Location: [facility];  Service: Cardiovascular;  Laterality: N/A;    VENTRICULOSTOMY  03/28/1997    Allergies:  Allergies  Allergen Reactions   Ketamine Other (See Comments)    In vegetative state for 15 minutes per pt   Erythromycin     unk   Fentanyl Nausea And Vomiting   Maitake Mushroom [[place]] Itching    Itchy throat   Penicillin V Itching    Gi upset   Shellfish Allergy Other (See Comments)    Pt has never had shellfish but tested positive on allergy test. Pt states contrast in CT is okay   Morphine Itching and Rash   Penicillins Rash    Has patient had a PCN reaction causing immediate rash, facial/tongue/throat swelling, SOB or lightheadedness with hypotension: Y Has patient had a PCN reaction causing severe rash involving mucus membranes or skin necrosis: Y Has patient had a PCN reaction that required hospitalization: N Has patient had a PCN reaction occurring within the last 10 years: Y If all of the above answers are "NO", then may proceed with Cephalosporin use.    Prednisone Rash    Medications:   Prior to Admission medications   Medication Sig Start Date End Date Taking? Authorizing Provider  albuterol (VENTOLIN HFA) 108 (90 Base) MCG/ACT inhaler Inhale 1-2 puffs into the lungs every 6 (six) hours as needed for wheezing or shortness of breath. 12/31/18  Yes [provider]  aspirin EC 81 MG EC tablet Take 1 tablet (81 mg total) by mouth daily. Swallow whole. 12/07/20  Yes Nicole Kindred A, DO  CREON 36000-114000 units CPEP capsule Take 36,000 Units by mouth 3 (three) times daily.   Yes [provider]  DULoxetine (CYMBALTA) 30 MG capsule Take 30 mg by mouth at bedtime. 01/28/19 01/27/23 Yes [provider]  ergocalciferol (VITAMIN D2) 1.25 MG (50000 UT) capsule Take 50,000 Units by mouth every 7 (seven) days.   Yes [provider]  Evolocumab (REPATHA) 140 MG/ML SOSY Inject 140 mg into the skin See admin instructions. Inject 140 mg subcutaneously every other Monday evening   Yes [provider]  fenofibrate 160 MG tablet Take 1 tablet (160 mg total) by mouth daily. 12/07/20  Yes Nicole Kindred A, DO  guaiFENesin-dextromethorphan (ROBITUSSIN DM) 100-10 MG/5ML syrup Take 10 mLs by mouth every 6 (six) hours as needed for cough. 03/25/22  Yes Regalado, Belkys A, MD  Insulin Regular Human (HUMULIN R U-500 KWIKPEN [place]) Inject 100-120 Units into the skin See admin instructions. Inject 290 units subcutaneous at every meal and 100 units subcutaneous at bedtime per patient   Yes  [provider]  ivabradine (CORLANOR) 7.5 MG TABS tablet Take 1 tablet (7.5 mg total) by mouth 2 (two) times daily with a meal. 08/02/21  Yes [name], Shaune Pascal, MD  levocetirizine (XYZAL) 5 MG tablet Take 5 mg by mouth daily. 03/05/22  Yes [provider]  levothyroxine (SYNTHROID) 125 MCG tablet Take 1 tablet (125 mcg total) by mouth daily at 6 (six) AM. 12/07/20  Yes Nicole Kindred A, DO  norethindrone-ethinyl estradiol-FE ([name] 1/20) 1-20 MG-MCG tablet Take 1 tablet by mouth daily.   Yes [provider]  pantoprazole (PROTONIX) 40 MG tablet Take 40 mg by mouth daily. 12/24/19  Yes [provider]  pregabalin (LYRICA) 150 MG capsule Take 300 mg by mouth at bedtime.   Yes [provider]  prochlorperazine (COMPAZINE) 5 MG tablet Take 5 mg by mouth daily.   Yes [provider]  rosuvastatin (CRESTOR) 40 MG tablet Take 40 mg by mouth daily. 08/09/21  Yes [provider]  sildenafil (REVATIO) 20 MG tablet Take 1 tablet (20 mg total) by mouth 3 (three) times daily. 05/11/22  Yes [name], Shaune Pascal, MD  sodium bicarbonate 650 MG tablet Take 650 mg by mouth 2 (two) times daily.   Yes [provider]  Torsemide 40 MG TABS Take 60 mg by mouth daily. 04/26/22  Yes Clegg, [name] D, NP  tretinoin (RETIN-A) 0.05 % cream Apply 1 application topically at bedtime. 01/17/21  Yes [provider]  carvedilol (COREG) 3.125 MG tablet Take 1 tablet (3.125 mg total)  by mouth 2 (two) times daily with a meal. Patient not taking: Reported on 05/30/2022 02/13/22 05/30/22  Donne Hazel, MD  ciprofloxacin (CIPRO) 500 MG tablet Take 500 mg by mouth 2 (two) times daily. Patient not taking: Reported on 05/30/2022    [provider]  clindamycin (CLEOCIN) 150 MG capsule Take 450 mg by mouth 3 (three) times daily. Patient not taking: Reported on 05/30/2022 05/18/22   [provider]  Insulin Pen Needle 32G X 4 MM MISC 1 Device by Does not apply route QID. For use with insulin pens 02/13/22   Donne Hazel, MD  insulin regular human CONCENTRATED (HUMULIN R U-500 KWIKPEN) 500 UNIT/ML KwikPen Inject 120 Units into the skin 3 (three) times daily with meals. 06/01/22   Erick Colace, NP  nitrofurantoin, macrocrystal-monohydrate, (MACROBID) 100 MG capsule Take 1 capsule (100 mg total) by mouth every 12 (twelve) hours. 06/01/22   Erick Colace, NP    Discontinued Meds:   Medications Discontinued During This Encounter  Medication Reason   ondansetron (ZOFRAN) injection 4 mg    albuterol (VENTOLIN HFA) 108 (90 Base) MCG/ACT inhaler 1-2 puff    levocetirizine (XYZAL) tablet 5 mg    HYDROmorphone (DILAUDID) injection 1 mg    HYDROmorphone (DILAUDID) injection 1-2 mg    oxyCODONE (Oxy IR/ROXICODONE) immediate release tablet 5-10 mg    cefTRIAXone (ROCEPHIN) 1 g in sodium chloride 0.9 % 100 mL IVPB    ivabradine (CORLANOR) tablet 7.5 mg    norepinephrine (LEVOPHED) 4-5 MG/250ML-% infusion SOLN Returned to ADS   insulin regular, human (MYXREDLIN) 100 units/ 100 mL infusion    insulin regular human CONCENTRATED (HUMULIN R) 500 UNIT/ML KwikPen 150 Units    dextrose 5 % in lactated ringers infusion    insulin regular human CONCENTRATED (HUMULIN R) 500 UNIT/ML KwikPen 120 Units    lactated ringers infusion    NON FORMULARY 180 Units    insulin aspart (novoLOG)  injection 0-9 Units    insulin regular human CONCENTRATED (HUMULIN R) 500 UNIT/ML KwikPen 180 Units     insulin aspart (novoLOG) injection 0-5 Units Change in therapy   insulin aspart (novoLOG) injection 0-9 Units Change in therapy   dextrose 5 % solution    dextrose 5 % solution    dextrose 5 %-0.9 % sodium chloride infusion    dextrose 10 % infusion    furosemide (LASIX) injection 40 mg    norethindrone-ethinyl estradiol-FE (LOESTRIN FE) 1-20 MG-MCG per tablet 1 tablet    heparin injection 5,000 Units     Social History:  reports that she has never smoked. She has never used smokeless tobacco. She reports that she does not drink alcohol and does not use drugs.  Family History:   Family History  Problem Relation Age of Onset   Diabetes Mother    Hypertension Mother    Hyperlipidemia Mother    Thyroid disease Mother    Hypertension Father    Diabetes Father    Pancreatic cancer Paternal Aunt    Pancreatic cancer Paternal Uncle    Colon cancer Neg Hx    Esophageal cancer Neg Hx    Stomach cancer Neg Hx     Blood pressure 120/75, pulse 78, temperature 99 F (37.2 C), temperature source Oral, resp. rate 18, height '4\' 9"'$  (1.448 m), weight 62.8 kg, last menstrual period 04/23/2022, SpO2 98 %. General appearance: alert, cooperative, appears stated age, and no distress Eyes: negative findings: conjunctivae and sclerae normal, right eye nearly swollen shut Resp: clear to auscultation bilaterally Cardio: regular rate and rhythm, S1, S2 normal, no murmur, click, rub or gallop GI: normal findings: bowel sounds normal and soft, non-tender Extremities: extremities with some swelling, but no pitting edema       Holley Bouche, MD 06/02/2022, 11:48 AM

## 2022-06-02 NOTE — Consult Note (Signed)
[facility] CONSULT NOTE  Patient Care Team: Elinor Parkinson as PCP - General (Physician Assistant) Jacelyn Pi, MD as PCP - Endocrinology (Endocrinology)  CHIEF COMPLAINTS/PURPOSE OF CONSULTATION:  Worsening anemia with elevated LDH  HISTORY OF PRESENTING ILLNESS:  Jennifer [name] 32 y.o. female is admitted to the hospital with multiple health issues.  She was admitted with diabetic ketoacidosis- Severe hypertriglyceridemia of greater than 5000.  She has chronic kidney disease and has had anemia from chronic kidney disease but during the hospital stay her hemoglobin decreased substantially to 6.7 and required blood transfusion on 06/01/2022.  We were consulted because the workup revealed an elevated LDH of 1528 with a clinical suspicion that the patient may be undergoing acute hemolysis especially because the total bilirubin was 5.1.  I reviewed her records extensively and collaborated the history with the patient.   MEDICAL HISTORY:  Past Medical History:  Diagnosis Date   Brain tumor ([place]) 03/29/1995   astrocytoma   CHF (congestive heart failure) (HCC)    Cholesterosis    DM (diabetes mellitus) ([place]) 10/10/2018   Fatty liver    HTN (hypertension) 10/10/2018   Hypothyroidism 10/10/2018   Lipoprotein deficiency    Lung disease    longevity long term   Pancreatitis    Polycystic ovary syndrome     SURGICAL HISTORY: Past Surgical History:  Procedure Laterality Date   ABDOMINAL SURGERY     pt states during miscarriage got her intestine   BRAIN SURGERY     EYE MUSCLE SURGERY Right 03/28/2014   PRESSURE SENSOR/CARDIOMEMS N/A 12/06/2021   Procedure: PRESSURE SENSOR/CARDIOMEMS;  Surgeon: [name], MD;  Location: [facility];  Service: Cardiovascular;  Laterality: N/A;   RIGHT HEART CATH N/A 08/05/2021   Procedure: RIGHT HEART CATH;  Surgeon: [name], MD;  Location: [facility];  Service: Cardiovascular;  Laterality: N/A;    RIGHT HEART CATH N/A 12/06/2021   Procedure: RIGHT HEART CATH;  Surgeon: [name], MD;  Location: [facility];  Service: Cardiovascular;  Laterality: N/A;   RIGHT/LEFT HEART CATH AND CORONARY ANGIOGRAPHY N/A 12/03/2020   Procedure: RIGHT/LEFT HEART CATH AND CORONARY ANGIOGRAPHY;  Surgeon: [name], MD;  Location: [facility];  Service: Cardiovascular;  Laterality: N/A;   VENTRICULOSTOMY  03/28/1997    SOCIAL HISTORY: Social History   Socioeconomic History   Marital status: Married    Spouse name: Not on file   Number of children: 0   Years of education: Not on file   Highest education level: Not on file  Occupational History   Occupation: [employer]  Tobacco Use   Smoking status: Never   Smokeless tobacco: Never  Vaping Use   Vaping Use: Never used  Substance and Sexual Activity   Alcohol use: Never   Drug use: Never   Sexual activity: Yes  Other Topics Concern   Not on file  Social History Narrative   Not on file   Social Determinants of Health   Financial Resource Strain: High Risk (04/05/2022)   Overall Financial Resource Strain (CARDIA)    Difficulty of Paying Living Expenses: Hard  Food Insecurity: No Food Insecurity (03/14/2022)   Hunger Vital Sign    Worried About Running Out of Food in the Last Year: Never true    Ran Out of Food in the Last Year: Never true  Transportation Needs: Unmet Transportation Needs (04/05/2022)   PRAPARE - Transportation    Lack of  Transportation (Medical): Yes    Lack of Transportation (Non-Medical): Yes  Physical Activity: Sufficiently Active (12/10/2021)   Exercise Vital Sign    Days of Exercise per Week: 3 days    Minutes of Exercise per Session: 60 min  Stress: No Stress Concern Present (12/10/2021)   [place]    Feeling of Stress : Not at all  Social Connections: Socially Integrated (12/10/2021)   Social Connection and Isolation Panel  [NHANES]    Frequency of Communication with Friends and Family: More than three times a week    Frequency of Social Gatherings with Friends and Family: More than three times a week    Attends Religious Services: More than 4 times per year    Active Member of [employer] or Organizations: No    Attends Music therapist: More than 4 times per year    Marital Status: Married  Human resources officer Violence: Not At Risk (03/14/2022)   Humiliation, Afraid, Rape, and Kick questionnaire    Fear of Current or Ex-Partner: No    Emotionally Abused: No    Physically Abused: No    Sexually Abused: No    FAMILY HISTORY: Family History  Problem Relation Age of Onset   Diabetes Mother    Hypertension Mother    Hyperlipidemia Mother    Thyroid disease Mother    Hypertension Father    Diabetes Father    Pancreatic cancer Paternal Aunt    Pancreatic cancer Paternal Uncle    Colon cancer Neg Hx    Esophageal cancer Neg Hx    Stomach cancer Neg Hx     ALLERGIES:  is allergic to ketamine, erythromycin, fentanyl, maitake mushroom [maitake], penicillin v, shellfish allergy, morphine, penicillins, and prednisone.  MEDICATIONS:  Current Facility-Administered Medications  Medication Dose Route Frequency Provider Last Rate Last Admin   0.9 %  sodium chloride infusion (Manually program via Guardrails IV Fluids)   Intravenous Once Elgergawy, Silver Huguenin, MD       acetaminophen (TYLENOL) tablet 650 mg  650 mg Oral Q6H PRN Candee Furbish, MD   650 mg at 06/02/22 1430   albuterol (PROVENTIL) (2.5 MG/3ML) 0.083% nebulizer solution 2.5 mg  2.5 mg Nebulization Q6H PRN Icard, Bradley L, DO   2.5 mg at 06/01/22 1205   aspirin EC tablet 81 mg  81 mg Oral Daily Candee Furbish, MD   81 mg at 06/02/22 0957   carvedilol (COREG) tablet 3.125 mg  3.125 mg Oral BID WC Agarwala, Einar Grad, MD   3.125 mg at 06/02/22 0956   Chlorhexidine Gluconate Cloth 2 % PADS 6 each  6 each Topical Daily Icard, Bradley L, DO   6 each at  06/02/22 0958   dextrose 5 %-0.9 % sodium chloride infusion   Intravenous Continuous Elgergawy, Silver Huguenin, MD 75 mL/hr at 06/02/22 1358 New Bag at 06/02/22 1358   dextrose 50 % solution 0-50 mL  0-50 mL Intravenous PRN Regan Lemming, MD       docusate sodium (COLACE) capsule 100 mg  100 mg Oral BID PRN Candee Furbish, MD       DULoxetine (CYMBALTA) DR capsule 30 mg  30 mg Oral QHS Candee Furbish, MD   30 mg at 06/01/22 2136   fenofibrate tablet 160 mg  160 mg Oral Daily Candee Furbish, MD   160 mg at 06/02/22 0957   furosemide (LASIX) injection 40 mg  40 mg Intravenous BID Elgergawy, Emeline Gins  S, MD   40 mg at 06/02/22 1212   guaiFENesin-dextromethorphan (ROBITUSSIN DM) 100-10 MG/5ML syrup 10 mL  10 mL Oral Q6H PRN Candee Furbish, MD       HYDROmorphone (DILAUDID) injection 0.5 mg  0.5 mg Intravenous Once PRN Opyd, Ilene Qua, MD       insulin (MYXREDLIN) 100 units/100 mL infusion for hypertriglyceridemia-induced pancreatitis  0.2 Units/kg/hr Intravenous Continuous Elgergawy, Silver Huguenin, MD 13.44 mL/hr at 06/02/22 1403 0.2 Units/kg/hr at 06/02/22 1403   ivabradine (CORLANOR) tablet 7.5 mg  7.5 mg Oral BID WC Kipp Brood, MD   7.5 mg at 06/02/22 0956   levothyroxine (SYNTHROID) tablet 125 mcg  125 mcg Oral Q0600 Candee Furbish, MD   125 mcg at 06/02/22 0546   lipase/protease/amylase (CREON) capsule 36,000 Units  36,000 Units Oral TID Candee Furbish, MD   36,000 Units at 06/02/22 0956   loratadine (CLARITIN) tablet 10 mg  10 mg Oral Daily Icard, Bradley L, DO   10 mg at 06/02/22 0957   nitrofurantoin (macrocrystal-monohydrate) (MACROBID) capsule 100 mg  100 mg Oral Q12H [place] Crane M, PA-C   100 mg at 06/02/22 0956   ondansetron (ZOFRAN) injection 4 mg  4 mg Intravenous Q6H PRN Candee Furbish, MD   4 mg at 05/30/22 [id number]   Oral care mouth rinse  15 mL Mouth Rinse PRN Kipp Brood, MD       oxyCODONE (Oxy IR/ROXICODONE) immediate release tablet 5 mg  5 mg Oral Q4H PRN Opyd, Ilene Qua, MD   5  mg at 06/01/22 2336   pantoprazole (PROTONIX) EC tablet 40 mg  40 mg Oral Daily Candee Furbish, MD   40 mg at 06/02/22 0957   polyethylene glycol (MIRALAX / GLYCOLAX) packet 17 g  17 g Oral Daily PRN Candee Furbish, MD       pregabalin (LYRICA) capsule 300 mg  300 mg Oral BID Candee Furbish, MD   300 mg at 06/02/22 0957   prochlorperazine (COMPAZINE) tablet 5 mg  5 mg Oral Q6H PRN [place] Crane M, PA-C       rosuvastatin (CRESTOR) tablet 40 mg  40 mg Oral Daily Candee Furbish, MD   40 mg at 06/02/22 0957   sildenafil (REVATIO) tablet 20 mg  20 mg Oral TID Candee Furbish, MD   20 mg at 06/02/22 0957   sodium bicarbonate tablet 650 mg  650 mg Oral BID Candee Furbish, MD   650 mg at 06/02/22 0957   sodium chloride flush (NS) 0.9 % injection 10-40 mL  10-40 mL Intracatheter Q12H Elgergawy, Silver Huguenin, MD   10 mL at 06/02/22 0958   sodium chloride flush (NS) 0.9 % injection 10-40 mL  10-40 mL Intracatheter PRN Elgergawy, Silver Huguenin, MD   10 mL at 06/02/22 0958    REVIEW OF SYSTEMS:   Constitutional: Denies fevers, chills or abnormal night sweats All other systems were reviewed with the patient and are negative.  PHYSICAL EXAMINATION: ECOG PERFORMANCE STATUS: 3 - Symptomatic, >50% confined to bed  Vitals:   06/02/22 1400 06/02/22 1430  BP: 109/76   Pulse: 73 74  Resp: (!) 25 (!) 27  Temp:    SpO2: 97% 99%   Filed Weights   05/31/22 0600 06/01/22 0635 06/02/22 0500  Weight: 149 lb 11.1 oz (67.9 kg) 148 lb 3.2 oz (67.2 kg) 138 lb 7.2 oz (62.8 kg)    GENERAL:alert, no distress and comfortable  LABORATORY DATA:  I have reviewed the data as listed Lab Results  Component Value Date   WBC 6.8 06/02/2022   HGB 9.2 (L) 06/02/2022   HCT 27.0 (L) 06/02/2022   MCV 87.5 06/02/2022   PLT 174 06/02/2022   Lab Results  Component Value Date   NA 129 (L) 06/02/2022   K 4.1 06/02/2022   CL 92 (L) 06/02/2022   CO2 23 06/02/2022    RADIOGRAPHIC STUDIES: I have personally reviewed the  radiological reports and agreed with the findings in the report.  ASSESSMENT AND PLAN:  Acute on chronic anemia: Patient has had anemia with elevated LDH with normal direct Coombs test.  Haptoglobin is pending and reticulocyte count will now be added.  It is unclear if the patient has normal autoimmune hemolysis versus elevated LDH from dysfunction in the liver Recommendation: Reticulocyte count: If the reticulocyte counts are low then it would suggest a bone marrow suppression. Split the bilirubin and obtain direct bilirubin to understand if the cause of hyperbilirubinemia is direct or indirect.  Hemolysis causes indirect hyperbilirubinemia. Agree with Dr. [facility] to stop Macrobid in the event that it could be the cause of nonautoimmune hemolysis. For completion of workup we will check iron studies [id number] and folic acid as well.  This may be all related to anemia of acute illness related bone marrow suppression.  This may be treated with supportive care and blood transfusions as needed.     Harriette Ohara, MD '@T'$ @

## 2022-06-03 ENCOUNTER — Inpatient Hospital Stay (HOSPITAL_COMMUNITY): Payer: Medicaid Other

## 2022-06-03 DIAGNOSIS — E781 Pure hyperglyceridemia: Secondary | ICD-10-CM | POA: Diagnosis not present

## 2022-06-03 DIAGNOSIS — B952 Enterococcus as the cause of diseases classified elsewhere: Secondary | ICD-10-CM

## 2022-06-03 DIAGNOSIS — I429 Cardiomyopathy, unspecified: Secondary | ICD-10-CM | POA: Diagnosis not present

## 2022-06-03 DIAGNOSIS — N390 Urinary tract infection, site not specified: Secondary | ICD-10-CM

## 2022-06-03 DIAGNOSIS — I5022 Chronic systolic (congestive) heart failure: Secondary | ICD-10-CM | POA: Diagnosis not present

## 2022-06-03 LAB — GLUCOSE, CAPILLARY
Glucose-Capillary: 45 mg/dL — ABNORMAL LOW (ref 70–99)
Glucose-Capillary: 65 mg/dL — ABNORMAL LOW (ref 70–99)
Glucose-Capillary: 72 mg/dL (ref 70–99)
Glucose-Capillary: 73 mg/dL (ref 70–99)
Glucose-Capillary: 83 mg/dL (ref 70–99)
Glucose-Capillary: 91 mg/dL (ref 70–99)
Glucose-Capillary: 110 mg/dL — ABNORMAL HIGH (ref 70–99)
Glucose-Capillary: 129 mg/dL — ABNORMAL HIGH (ref 70–99)
Glucose-Capillary: 99 mg/dL (ref 70–99)
Glucose-Capillary: 84 mg/dL (ref 70–99)
Glucose-Capillary: 74 mg/dL (ref 70–99)
Glucose-Capillary: 62 mg/dL — ABNORMAL LOW (ref 70–99)
Glucose-Capillary: 78 mg/dL (ref 70–99)
Glucose-Capillary: 94 mg/dL (ref 70–99)
Glucose-Capillary: 87 mg/dL (ref 70–99)
Glucose-Capillary: 92 mg/dL (ref 70–99)
Glucose-Capillary: 88 mg/dL (ref 70–99)
Glucose-Capillary: 97 mg/dL (ref 70–99)
Glucose-Capillary: 88 mg/dL (ref 70–99)
Glucose-Capillary: 83 mg/dL (ref 70–99)
Glucose-Capillary: 71 mg/dL (ref 70–99)

## 2022-06-03 LAB — POCT I-STAT, CHEM 8
BUN: 21 mg/dL — ABNORMAL HIGH (ref 6–20)
Calcium, Ion: 1.05 mmol/L — ABNORMAL LOW (ref 1.15–1.40)
Chloride: 104 mmol/L (ref 98–111)
Creatinine, Ser: 2 mg/dL — ABNORMAL HIGH (ref 0.44–1.00)
Glucose, Bld: 81 mg/dL (ref 70–99)
HCT: 25 % — ABNORMAL LOW (ref 36.0–46.0)
Hemoglobin: 8.5 g/dL — ABNORMAL LOW (ref 12.0–15.0)
Potassium: 4.2 mmol/L (ref 3.5–5.1)
Sodium: 133 mmol/L — ABNORMAL LOW (ref 135–145)
TCO2: 22 mmol/L (ref 22–32)

## 2022-06-03 LAB — COOXEMETRY PANEL
Carboxyhemoglobin: 1.4 % (ref 0.5–1.5)
Methemoglobin: <0.7 % (ref 0.0–1.5)
O2 Saturation: 51.2 %
Total hemoglobin: 7.8 g/dL — ABNORMAL LOW (ref 12.0–16.0)

## 2022-06-03 LAB — COMPREHENSIVE METABOLIC PANEL
ALT: 76 U/L — ABNORMAL HIGH (ref 0–44)
AST: 142 U/L — ABNORMAL HIGH (ref 15–41)
Albumin: 2.2 g/dL — ABNORMAL LOW (ref 3.5–5.0)
Alkaline Phosphatase: 164 U/L — ABNORMAL HIGH (ref 38–126)
Anion gap: 12 (ref 5–15)
BUN: 21 mg/dL — ABNORMAL HIGH (ref 6–20)
CO2: 19 mmol/L — ABNORMAL LOW (ref 22–32)
Calcium: 7.1 mg/dL — ABNORMAL LOW (ref 8.9–10.3)
Chloride: 98 mmol/L (ref 98–111)
Creatinine, Ser: 1.9 mg/dL — ABNORMAL HIGH (ref 0.44–1.00)
GFR, Estimated: 36 mL/min — ABNORMAL LOW (ref >60)
Glucose, Bld: 127 mg/dL — ABNORMAL HIGH (ref 70–99)
Sodium: 129 mmol/L — ABNORMAL LOW (ref 135–145)
Total Bilirubin: 3.9 mg/dL — ABNORMAL HIGH (ref 0.3–1.2)
Total Protein: 5.5 g/dL — ABNORMAL LOW (ref 6.5–8.1)

## 2022-06-03 LAB — POCT I-STAT EG7
Acid-base deficit: 6 mmol/L — ABNORMAL HIGH (ref 0.0–2.0)
Bicarbonate: 19.1 mmol/L — ABNORMAL LOW (ref 20.0–28.0)
Calcium, Ion: 1.01 mmol/L — ABNORMAL LOW (ref 1.15–1.40)
HCT: 29 % — ABNORMAL LOW (ref 36.0–46.0)
Hemoglobin: 9.9 g/dL — ABNORMAL LOW (ref 12.0–15.0)
O2 Saturation: 33 %
Patient temperature: 100
Potassium: 3.8 mmol/L (ref 3.5–5.1)
Sodium: 137 mmol/L (ref 135–145)
TCO2: 20 mmol/L — ABNORMAL LOW (ref 22–32)
pCO2, Ven: 36.4 mmHg — ABNORMAL LOW (ref 44–60)
pH, Ven: 7.332 (ref 7.25–7.43)
pO2, Ven: 23 mmHg — CL (ref 32–45)

## 2022-06-03 LAB — MAGNESIUM: Magnesium: 1.8 mg/dL (ref 1.7–2.4)

## 2022-06-03 LAB — CBC
HCT: 24 % — ABNORMAL LOW (ref 36.0–46.0)
Hemoglobin: 7.2 g/dL — ABNORMAL LOW (ref 12.0–15.0)
MCH: 26.9 pg (ref 26.0–34.0)
MCHC: 30 g/dL (ref 30.0–36.0)
MCV: 89.6 fL (ref 80.0–100.0)
Platelets: 166 10*3/uL (ref 150–400)
RBC: 2.68 MIL/uL — ABNORMAL LOW (ref 3.87–5.11)
RDW: 19.5 % — ABNORMAL HIGH (ref 11.5–15.5)
WBC: 9 10*3/uL (ref 4.0–10.5)
nRBC: 0 % (ref 0.0–0.2)

## 2022-06-03 LAB — HAPTOGLOBIN: Haptoglobin: 262 mg/dL (ref 33–278)

## 2022-06-03 LAB — PHOSPHORUS: Phosphorus: 3.4 mg/dL (ref 2.5–4.6)

## 2022-06-03 LAB — TRIGLYCERIDES: Triglycerides: 4045 mg/dL — ABNORMAL HIGH (ref <150)

## 2022-06-03 MED ORDER — FUROSEMIDE 10 MG/ML IJ SOLN
80.0000 mg | Freq: Two times a day (BID) | INTRAMUSCULAR | Status: DC
  Administered 2022-06-03: 80 mg via INTRAVENOUS
  Filled 2022-06-03: qty 8

## 2022-06-03 MED ORDER — FUROSEMIDE 10 MG/ML IJ SOLN
120.0000 mg | Freq: Once | INTRAVENOUS | Status: AC
  Administered 2022-06-03: 120 mg via INTRAVENOUS
  Filled 2022-06-03: qty 10

## 2022-06-03 MED ORDER — DEXTROSE 10 % IV SOLN: INTRAVENOUS | Status: DC

## 2022-06-03 MED ORDER — METOLAZONE 5 MG PO TABS
5.0000 mg | ORAL_TABLET | Freq: Once | ORAL | Status: AC
  Administered 2022-06-03: 5 mg via ORAL
  Filled 2022-06-03: qty 1

## 2022-06-03 MED ORDER — FUROSEMIDE 10 MG/ML IJ SOLN
40.0000 mg | Freq: Two times a day (BID) | INTRAMUSCULAR | Status: DC
  Administered 2022-06-03: 40 mg via INTRAVENOUS
  Filled 2022-06-03: qty 4

## 2022-06-03 NOTE — Progress Notes (Signed)
Hypoglycemic Event  CBG: 45, repeat 72 (Finger stick)   Treatment: 4 oz juice/soda  Symptoms: none   Follow-up CBG: Time:0120  CBG Result: 65 (PICC)  Possible Reasons for Event: Medication Regimen:  Insulin gtt + D5/NS gtt   Comments: Hypoglycemia protocol repeated    CBG: 65 (PICC)  Treatment: 4 oz juice/soda  Symptoms: none   Follow-up CBG: Time:0140  CBG Result: 83 (PICC)  Possible Reasons for Event: Medication Regimen:  Insulin gtt + D5/NS gtt   Comments: Hypoglycemia standing orders completed                      0208 CBG Result 93 (PICC)     Jennifer [name]

## 2022-06-03 NOTE — Consult Note (Addendum)
Advanced Heart Failure Team Consult Note   Primary Physician: Sue Lush, PA-C PCP-Cardiologist:  None  Reason for Consultation: acute on chronic CHF  HPI:    Jennifer [name] is seen today for evaluation of acute on chronic CHF at the request of Dr. Waldron Labs, .   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   Has been followed by Paramedicine. Has been seen in HF clinic several times over the last few months. Multiple discrepancies with medications noted at visits and misses meds at times. Several AKIs after adjusting diuretics (including self adjustments). Has used Furoscix (poor response), metolazone and torsemide without perceived improvement in HF symptoms. Has had multiple admissions for vasculitis, pancreatitis, AKI, hyperglycemia, volume overload and electrolyte imbalances since. Has required inotropic support several times in the past.    She presented to the ED 05/29/22 with abdominal pain, N/V 2/2 with DKA/hypertriglyceridemia, TG >5000. Concern for pancreatitis. CTA/P without acute process, no pancreatitis. Has been placed on insulin / D10 drip. During admission UCx with >1000K e coli and 80K enterococcus, completed Macrobid. Was going to d/c 3/6 however experienced pleuritic/substernal chest pain with desats while ambulating, d/c held off. EKG stable and ultimately resolved. Now AHF team consulted for volume overload.   Patient reports full compliance with meds at home. Recent cardiomems reading 3/2 was 18 (goal 17) so volume status was relatively stable prior to admission. CXR with mildly worsened pulmonary edema. Today CVP reading 18, up about ~10lbs from baseline (144lbs). Denies CP/SOB. Abdominal pain now resolved. Comfortable in chair.   Cardiac studies  reviewed:  [place] 9/23: RA 15, Fick cardiac output/index = 4.2/2.6Thermo CO/CI = 3.7/2.3. Mild PAH with primarily RV failure Echo 9/23: EF 50-55%, RV normal. mild MR, mild-mod TR Echo 5/23: EF 45-50%, RV normal, mild-mod MR, RVSP 30. Only mild TR [facility] 5/23: RA 12 prominent V waves, Fick cardiac output/index = 4.4/2.8 Echo (10/22): EF up to 45-50%, grade II DD, RV ok. LifeVest off. Echo 11/28/20 EF 20-25%, moderate RV dysfunction. 9/22 cMRI c/w prior myocarditis vs sarcoid (see below). LVEF 29%. RV normal.  9/22 R/LHC showing single vessel RCA occlusion, RA 1, LVEDP 17, Fick CO 5.6/CI 3.88.   Home Medications Prior to Admission medications   Medication Sig Start Date End Date Taking? Authorizing Provider  albuterol (VENTOLIN HFA) 108 (90 Base) MCG/ACT inhaler Inhale 1-2 puffs into the lungs every 6 (six) hours as needed for wheezing or shortness of breath. 12/31/18  Yes [provider]  aspirin EC 81 MG EC tablet Take 1 tablet (81 mg total) by mouth daily. Swallow whole. 12/07/20  Yes Nicole Kindred A, DO  CREON 36000-114000 units CPEP capsule Take 36,000 Units by mouth 3 (three) times daily.   Yes [provider]  DULoxetine (CYMBALTA) 30 MG capsule Take 30 mg by mouth at bedtime. 01/28/19 01/27/23 Yes [provider]  ergocalciferol (VITAMIN D2) 1.25 MG (50000 UT) capsule Take 50,000 Units by mouth every 7 (seven) days.   Yes [provider]  Evolocumab (REPATHA) 140 MG/ML SOSY Inject 140 mg into the skin See admin instructions. Inject 140 mg subcutaneously every other Monday evening   Yes [provider]  fenofibrate 160 MG tablet Take 1 tablet (160 mg total) by mouth daily. 12/07/20  Yes Ezekiel Slocumb, DO  guaiFENesin-dextromethorphan (ROBITUSSIN  DM) 100-10 MG/5ML syrup Take 10 mLs by mouth every 6 (six) hours as needed for cough. 03/25/22  Yes Regalado, Belkys A, MD  Insulin Regular Human (HUMULIN R U-500 KWIKPEN [place]) Inject 100-120 Units into the skin  See admin instructions. Inject 290 units subcutaneous at every meal and 100 units subcutaneous at bedtime per patient   Yes [provider]  ivabradine (CORLANOR) 7.5 MG TABS tablet Take 1 tablet (7.5 mg total) by mouth 2 (two) times daily with a meal. 08/02/21  Yes [name], Shaune Pascal, MD  levocetirizine (XYZAL) 5 MG tablet Take 5 mg by mouth daily. 03/05/22  Yes [provider]  levothyroxine (SYNTHROID) 125 MCG tablet Take 1 tablet (125 mcg total) by mouth daily at 6 (six) AM. 12/07/20  Yes Nicole Kindred A, DO  norethindrone-ethinyl estradiol-FE ([name] 1/20) 1-20 MG-MCG tablet Take 1 tablet by mouth daily.   Yes [provider]  pantoprazole (PROTONIX) 40 MG tablet Take 40 mg by mouth daily. 12/24/19  Yes [provider]  pregabalin (LYRICA) 150 MG capsule Take 300 mg by mouth at bedtime.   Yes [provider]  prochlorperazine (COMPAZINE) 5 MG tablet Take 5 mg by mouth daily.   Yes [provider]  rosuvastatin (CRESTOR) 40 MG tablet Take 40 mg by mouth daily. 08/09/21  Yes [provider]  sildenafil (REVATIO) 20 MG tablet Take 1 tablet (20 mg total) by mouth 3 (three) times daily. 05/11/22  Yes [name], Shaune Pascal, MD  sodium bicarbonate 650 MG tablet Take 650 mg by mouth 2 (two) times daily.   Yes [provider]  Torsemide 40 MG TABS Take 60 mg by mouth daily. 04/26/22  Yes Clegg, [name] D, NP  tretinoin (RETIN-A) 0.05 % cream Apply 1 application topically at bedtime. 01/17/21  Yes [provider]  carvedilol (COREG) 3.125 MG tablet Take 1 tablet (3.125 mg total) by mouth 2 (two) times daily with a meal. Patient not taking: Reported on 05/30/2022 02/13/22 05/30/22  Donne Hazel, MD  ciprofloxacin (CIPRO) 500 MG tablet Take 500 mg by mouth 2 (two) times daily. Patient not taking: Reported on 05/30/2022    [provider]  clindamycin (CLEOCIN) 150 MG capsule Take 450 mg by mouth 3 (three) times daily. Patient  not taking: Reported on 05/30/2022 05/18/22   [provider]  Insulin Pen Needle 32G X 4 MM MISC 1 Device by Does not apply route QID. For use with insulin pens 02/13/22   Donne Hazel, MD  insulin regular human CONCENTRATED (HUMULIN R U-500 KWIKPEN) 500 UNIT/ML KwikPen Inject 120 Units into the skin 3 (three) times daily with meals. 06/01/22   Erick Colace, NP  nitrofurantoin, macrocrystal-monohydrate, (MACROBID) 100 MG capsule Take 1 capsule (100 mg total) by mouth every 12 (twelve) hours. 06/01/22   Erick Colace, NP    Past Medical History: Past Medical History:  Diagnosis Date   Brain tumor [facility]) 03/29/1995   astrocytoma   CHF (congestive heart failure) ([place])    Cholesterosis    DM (diabetes mellitus) ([place]) 10/10/2018   Fatty liver    HTN (hypertension) 10/10/2018   Hypothyroidism 10/10/2018   Lipoprotein deficiency    Lung disease    longevity long term   Pancreatitis    Polycystic ovary syndrome     Past Surgical History: Past Surgical History:  Procedure Laterality Date   ABDOMINAL SURGERY     pt states during miscarriage got her intestine   BRAIN SURGERY  EYE MUSCLE SURGERY Right 03/28/2014   PRESSURE SENSOR/CARDIOMEMS N/A 12/06/2021   Procedure: PRESSURE SENSOR/CARDIOMEMS;  Surgeon: [name], MD;  Location: [place] [facility];  Service: Cardiovascular;  Laterality: N/A;   RIGHT HEART CATH N/A 08/05/2021   Procedure: RIGHT HEART CATH;  Surgeon: [name], MD;  Location: [place] [facility];  Service: Cardiovascular;  Laterality: N/A;   RIGHT HEART CATH N/A 12/06/2021   Procedure: RIGHT HEART CATH;  Surgeon: [name], MD;  Location: [place] [facility];  Service: Cardiovascular;  Laterality: N/A;   RIGHT/LEFT HEART CATH AND CORONARY ANGIOGRAPHY N/A 12/03/2020   Procedure: RIGHT/LEFT HEART CATH AND CORONARY ANGIOGRAPHY;  Surgeon: [name], MD;  Location: [place] [facility];  Service: Cardiovascular;  Laterality: N/A;    VENTRICULOSTOMY  03/28/1997    Family History: Family History  Problem Relation Age of Onset   Diabetes Mother    Hypertension Mother    Hyperlipidemia Mother    Thyroid disease Mother    Hypertension Father    Diabetes Father    Pancreatic cancer Paternal Aunt    Pancreatic cancer Paternal Uncle    Colon cancer Neg Hx    Esophageal cancer Neg Hx    Stomach cancer Neg Hx    Social History: Social History   Socioeconomic History   Marital status: Married    Spouse name: Not on file   Number of children: 0   Years of education: Not on file   Highest education level: Not on file  Occupational History   Occupation: [employer]  Tobacco Use   Smoking status: Never   Smokeless tobacco: Never  Vaping Use   Vaping Use: Never used  Substance and Sexual Activity   Alcohol use: Never   Drug use: Never   Sexual activity: Yes  Other Topics Concern   Not on file  Social History Narrative   Not on file   Social Determinants of Health   Financial Resource Strain: High Risk (04/05/2022)   Overall Financial Resource Strain (CARDIA)    Difficulty of Paying Living Expenses: Hard  Food Insecurity: No Food Insecurity (03/14/2022)   Hunger Vital Sign    Worried About Running Out of Food in the Last Year: Never true    Ran Out of Food in the Last Year: Never true  Transportation Needs: Unmet Transportation Needs (04/05/2022)   PRAPARE - Transportation    Lack of Transportation (Medical): Yes    Lack of Transportation (Non-Medical): Yes  Physical Activity: Sufficiently Active (12/10/2021)   Exercise Vital Sign    Days of Exercise per Week: 3 days    Minutes of Exercise per Session: 60 min  Stress: No Stress Concern Present (12/10/2021)   [place] of Stress : Not at all  Social Connections: [place] (12/10/2021)   Social Connection and Isolation Panel [NHANES]    Frequency of Communication with  Friends and Family: More than three times a week    Frequency of Social Gatherings with Friends and Family: More than three times a week    Attends Religious Services: More than 4 times per year    Active Member of [employer] or Organizations: No    Attends Music therapist: More than 4 times per year    Marital Status: Married   Allergies:  Allergies  Allergen Reactions   Ketamine Other (See Comments)    In vegetative state for 15 minutes  per pt   Erythromycin     unk   Fentanyl Nausea And Vomiting   Maitake Mushroom [[place]] Itching    Itchy throat   Penicillin V Itching    Gi upset   Shellfish Allergy Other (See Comments)    Pt has never had shellfish but tested positive on allergy test. Pt states contrast in CT is okay   Morphine Itching and Rash   Penicillins Rash    Has patient had a PCN reaction causing immediate rash, facial/tongue/throat swelling, SOB or lightheadedness with hypotension: Y Has patient had a PCN reaction causing severe rash involving mucus membranes or skin necrosis: Y Has patient had a PCN reaction that required hospitalization: N Has patient had a PCN reaction occurring within the last 10 years: Y If all of the above answers are "NO", then may proceed with Cephalosporin use.    Prednisone Rash   Objective:   Vital Signs:   Temp:  [97.8 F (36.6 C)-98.5 F (36.9 C)] 97.9 F (36.6 C) (03/08 1121) Pulse Rate:  [69-86] 85 (03/08 0930) Resp:  [15-31] 29 (03/08 0930) BP: (85-131)/(52-113) 131/78 (03/08 0900) SpO2:  [65 %-100 %] 93 % (03/08 0930) Weight:  [69.6 kg] 69.6 kg (03/08 0500) Last BM Date : 06/02/22  Weight change: Filed Weights   06/01/22 0635 06/02/22 0500 06/03/22 0500  Weight: 67.2 kg 62.8 kg 69.6 kg    Intake/Output:   Intake/Output Summary (Last 24 hours) at 06/03/2022 1306 Last data filed at 06/03/2022 1022 Gross per 24 hour  Intake 2425.98 ml  Output 1000 ml  Net 1425.98 ml   Physical Exam   CVP 18 General:   chronically ill appearing.  No respiratory difficulty HEENT: normal Neck: supple. JVD to jaw. Carotids 2+ bilat; no bruits. No lymphadenopathy or thyromegaly appreciated. Cor: PMI nondisplaced. Regular rate & rhythm. No rubs, gallops or murmurs. Lungs: clear Abdomen: soft, nontender, nondistended. No hepatosplenomegaly. No bruits or masses. Good bowel sounds. Extremities: no cyanosis, clubbing, rash, non-pitting BLE edema. RUE PICC Neuro: alert & oriented x 3, cranial nerves grossly intact. moves all 4 extremities w/o difficulty. Affect pleasant.   Telemetry   NSR 70s (Personally reviewed)    EKG    NSR 75 bpm   Labs   Basic Metabolic Panel: Recent Labs  Lab 05/30/22 1905 05/31/22 0155 06/01/22 1038 06/01/22 1548 06/02/22 0208 06/02/22 0620 06/02/22 1009 06/03/22 0339 06/03/22 0705 06/03/22 0811  NA 136 133* 130* 133* 128* 121* 129* 129* 137 133*  K 3.1* 4.4 7.1* 5.8* SPECIMEN HEMOLYZED. HEMOLYSIS MAY AFFECT INTEGRITY OF RESULTS. SPECIMEN HEMOLYZED. HEMOLYSIS MAY AFFECT INTEGRITY OF RESULTS. 4.1 SPECIMEN HEMOLYZED. HEMOLYSIS MAY AFFECT INTEGRITY OF RESULTS. 3.8 4.2  CL 108 103 98 97* 96* 92*  --  98  --  104  CO2 13* 20* 21* 22 19* 23  --  19*  --   --   GLUCOSE 199* 163* 242* 256* 167* 69*  --  127*  --  81  BUN '19 18 18 19 20 20  '$ --  21*  --  21*  CREATININE 1.22* 1.32* 1.59* 1.74* 1.63* 1.07*  --  1.90*  --  2.00*  CALCIUM 6.6* 7.8* 8.4* 8.3* 7.8* 7.5*  --  7.1*  --   --   MG 1.9 2.2 1.9  --  2.0  --   --  1.8  --   --   PHOS 3.1 3.6 2.7  --  2.3*  --   --  3.4  --   --  Liver Function Tests: Recent Labs  Lab 06/01/22 1038 06/01/22 1822 06/02/22 0208 06/02/22 1538 06/03/22 0339  AST 172* RESULTS UNAVAILABLE DUE TO INTERFERING SUBSTANCE SPECIMEN HEMOLYZED. HEMOLYSIS MAY AFFECT INTEGRITY OF RESULTS. 110* 142*  ALT 88* RESULTS UNAVAILABLE DUE TO INTERFERING SUBSTANCE SPECIMEN HEMOLYZED. HEMOLYSIS MAY AFFECT INTEGRITY OF RESULTS. 78* 76*  ALKPHOS 153* 154* 152*  176* 164*  BILITOT 3.0* 4.8* 5.1* 2.7* 3.9*  PROT >12.0* RESULTS UNAVAILABLE DUE TO INTERFERING SUBSTANCE SPECIMEN HEMOLYZED. HEMOLYSIS MAY AFFECT INTEGRITY OF RESULTS. 5.3* 5.5*  ALBUMIN 2.7* 2.7* 2.5* 2.4* 2.2*   Recent Labs  Lab 05/29/22 0715 05/30/22 0235 06/01/22 1822  LIPASE 76* 21 26   No results for input(s): "AMMONIA" in the last 168 hours.  CBC: Recent Labs  Lab 05/29/22 0715 05/29/22 1400 06/01/22 0801 06/01/22 1510 06/01/22 1822 06/02/22 0208 06/02/22 1009 06/03/22 0339 06/03/22 0705 06/03/22 0811  WBC 12.0*   < > 6.6 6.6 5.9 6.8  --  9.0  --   --   NEUTROABS 10.4*  --   --   --  3.8  --   --   --   --   --   HGB 11.1*   < > 7.7* 6.7* 7.6* 8.3* 9.2* 7.2* 9.9* 8.5*  HCT 30.3*   < > 24.3* 21.8* 24.2* 24.4* 27.0* 24.0* 29.0* 25.0*  MCV 86.6   < > 92.4 90.5 90.0 87.5  --  89.6  --   --   PLT 206   < > 134* 120* 158 174  --  166  --   --    < > = values in this interval not displayed.   Cardiac Enzymes: No results for input(s): "CKTOTAL", "CKMB", "CKMBINDEX", "TROPONINI" in the last 168 hours.  BNP: BNP (last 3 results) Recent Labs    03/20/22 1258 03/26/22 2328 04/05/22 1009  BNP 214.5* 48.4 36.9   ProBNP (last 3 results) No results for input(s): "PROBNP" in the last 8760 hours.  CBG: Recent Labs  Lab 06/03/22 0702 06/03/22 0924 06/03/22 1017 06/03/22 1106 06/03/22 1203  GLUCAP 84 74 62* 78 94   Coagulation Studies: No results for input(s): "LABPROT", "INR" in the last 72 hours.  Imaging   DG Chest Port 1 View  Result Date: 06/03/2022 CLINICAL DATA:  Hypoxia. EXAM: PORTABLE CHEST 1 VIEW COMPARISON:  Chest x-ray dated June 01, 2022. FINDINGS: New right upper extremity PICC line with tip in the proximal right atrium. Unchanged cardiomegaly. Diffuse interstitial thickening and hazy airspace opacities throughout both lungs have mildly worsened in the interval. No pneumothorax or large pleural effusion. No acute osseous abnormality. IMPRESSION: 1.  Mildly worsened pulmonary edema. 2. New right upper extremity PICC line with tip in the proximal right atrium. Electronically Signed   By: Titus Dubin M.D.   On: 06/03/2022 10:19    Medications:   Current Medications:  sodium chloride   Intravenous Once   aspirin EC  81 mg Oral Daily   carvedilol  3.125 mg Oral BID WC   Chlorhexidine Gluconate Cloth  6 each Topical Daily   DULoxetine  30 mg Oral QHS   fenofibrate  160 mg Oral Daily   furosemide  40 mg Intravenous BID   ivabradine  7.5 mg Oral BID WC   levothyroxine  125 mcg Oral Q0600   lipase/protease/amylase  36,000 Units Oral TID   loratadine  10 mg Oral Daily   pantoprazole  40 mg Oral Daily   pregabalin  300 mg Oral BID  rosuvastatin  40 mg Oral Daily   sildenafil  20 mg Oral TID   sodium bicarbonate  650 mg Oral BID   sodium chloride flush  10-40 mL Intracatheter Q12H    Infusions:  dextrose 50 mL/hr at 06/03/22 1022   insulin 0.2 Units/kg/hr (06/03/22 1207)    Patient Profile  Jennifer [name] is a 32 y.o.woman with a complex PMHx including 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 AHF team to see for volume management.  Assessment/Plan   Severe hyperTG - Due to LPL deficiency w/ recent pancreatitis. - Continue fenofibrate  - Previously offered referral to [facility] for further management. She prefers to continue with her endocrinologist at [facility] who currently manages/follows. - >5000 on admission, downtrending on insulin gtt, last 4045 - per primary - CTA/P negative for pancreatitis or other acute findings  NICM, recovered EF, previously with HFrEF (EF 29%) - Echo (5/23): EF 45-50%, RV normal  - Echo 9/23: EF 50-55%, RV normal. mild MR, mild-mod TR - Moderately volume overloaded on exam. CVP 18. Currently on IV lasix 40 BID, will increase to 80 mg IV  BID. - Cardiomems, last read 3/2: 18 (PAD goal 17) - Trend CVP, will check co-ox - Continue Coreg 3.125 mg bid. - Continue Corlanor 7.5 mg bid.  - Off Jardiance due to frequent GU infections (+UTI this admission). Off Entresto with AKI. Off spiro w/ hyperkalemia  - Strict I&O, daily weights  Hx CAD - Single vessel RCA occlusion (small co-dominant vessel) on cardiac cath 09/08. Medical management - Continue aspirin + statin. - No chest pain on exam    CKD w/ AKI -Scr baseline variable with multiple AKIs, typically 1.5-2.0 -Scr this admit 1.07>1.9>2 today -Follow with diuresis   5. Pulmonary hypertension - Mild to moderate on RHC 5/23. PVR 3.7 WU.   6. TR - Prominent v-waves in RA tracing on RHC 5/23. - Echo 5/23 shows only mild TR  - mild to mod on 9/23 echo   7. Type 3c DM, DKA - Follows w/ endocrinology at [facility]. - 2/2 chronic pancreatitis  - per primary   9. Chronic abdominal swelling/bloating - Seen at [facility] (11/20) for IBS, per chart review. - Had penumatosis intestinalis. She was started on pancreatic enzyme replacement. - EGD (10/21): normal, bx taken to assess for celiac. - CT abdomen (2/22) and RUQ US showed no obvious gallbladder stones. - HIDA (4/22): ended early due to tachypnea, but cystic duct and gallbladder appeared WNL. - CT abdomen 12/22 unremarkable  - US abdomen 11/25/21: No ascites - suspect primarily fatty liver - Abd XR 12/27: mild gaseous distention    10. Poor Med Compliance - Followed by paramedicine OP  Length of Stay: [place], NP  06/03/2022, 1:06 PM  Advanced Heart Failure Team Pager [phone] (M-F; 7a - 5p)  Please contact [facility] for night-coverage after hours (4p -7a ) and weekends on [email or web address]

## 2022-06-03 NOTE — Consult Note (Signed)
Reason for Consult: Hypertriglyceridemia w/ Electrolyte derangements and AKI  Referring Physician: Dr. Phillips Climes  Chief Complaint: Electrolyte derangement  Assessment/Plan: Hypertriglyceridemia Patient has known hx of familial hypertriglyceridemia, notes her values can be as high as 25K. Patient believed to have poor medication compliance, is on Repatha, but has not had for 2 weeks. Patient report's getting plasmapheresis for hypertriglyceridemia in remote past for pancreatitis. Patient does not have pancreatitis on CT Abd this admission, w/ normal lipase, making pancreatitis less likely. Triglycerides elevated to >5 K on admission. Team considered plasmapheresis for this admission, but low evidence in efficacy. Will attempt to see if patient can have home Repatha brought in (patient was awaiting prior auth). TG improved to 4K today.  -Consider plasmapheresis  -F/u if patient to able to have Repatha brought in  -Insulin drip  -Continue Creon  -Continue fenofibrate 160 mg  -Continue Crestor 40 mg Pseudo Hyponatremia Patient Na likely low in setting of severely elevated triglycerides. Using Omni Na Hypertriglyceridemia calculator, Na corrects by a factor of 8 meq, from 133 to 141, VBG iSTAT showed appropriate Na of 137  -Continue to monitor  -Lower triglycerides per above AKI on CKD IV Baseline Cr around ~1.5 in December of 23, Cr on admission 1.65 with rise to 2.0 this morning, Patient Cr rise likely in setting of lasix given, to help balance fluid given through insulin drip and blood transfusion. S/p 40 mg IV lasix x 2. Will continue to monitor.   -Continue to monitor  -Avoid nephrotoxic agents  Anemia Hgb 11.3 on admission and fell to 6.7, patient s/p transfusion x1 on 06/01/22. Hgb discrepancy noting 7.2 earlier this morning, and 9.9 later this morning. True Hgb value likely close to 7.2 given 1 unit PRBC transfusion at 6.7. Hematology consulted for change in Hgb, concerning for  hemolytic process. Macrobid stopped for UTI as possible cause/concern for hemolytic anemia.   -Hematology following, appreciate recs    HPI: Jakailyn Dils is an 32 y.o. female w/ past medical history of astrocytoma, lipoprotein deficiency, pancreatitis, hypercholesterolemia, hyper triglyceridemia, pulmonary hypertension status post CardioMEMS implantation, on Corlanor and sildenafil. Patient presented to hospital found to be in DKA, w/ UTI (E.coli and eterococcus faecalis), and hypertriglyceridemia w/o pancreatitis, and was admitted to ICU. She was treated for DKA and UTI and transferred to hospitalist service. On hospitalist service she was started on an insulin drip for her triglycerides.      Chemistry and CBC: Creatinine, Ser  Date/Time Value Ref Range Status  06/03/2022 03:39 AM 1.90 (H) 0.44 - 1.00 mg/dL Final    Comment:    POST-ULTRACENTRIFUGATION  06/02/2022 06:20 AM 1.07 (H) 0.44 - 1.00 mg/dL Final  06/02/2022 02:08 AM 1.63 (H) 0.44 - 1.00 mg/dL Final    Comment:    POST-ULTRACENTRIFUGATION  06/01/2022 03:48 PM 1.74 (H) 0.44 - 1.00 mg/dL Final    Comment:    POST-ULTRACENTRIFUGATION  06/01/2022 10:38 AM 1.59 (H) 0.44 - 1.00 mg/dL Final    Comment:    POST-ULTRACENTRIFUGATION  05/31/2022 01:55 AM 1.32 (H) 0.44 - 1.00 mg/dL Final    Comment:    POST-ULTRACENTRIFUGATION  05/30/2022 07:05 PM 1.22 (H) 0.44 - 1.00 mg/dL Final    Comment:    POST-ULTRACENTRIFUGATION  05/30/2022 02:35 AM 1.61 (H) 0.44 - 1.00 mg/dL Final    Comment:    POST-ULTRACENTRIFUGATION  05/29/2022 10:45 PM 1.58 (H) 0.44 - 1.00 mg/dL Final    Comment:    POST-ULTRACENTRIFUGATION  05/29/2022 06:49 PM 1.80 (H) 0.44 - 1.00  mg/dL Final    Comment:    POST-ULTRACENTRIFUGATION  05/29/2022 01:53 PM 1.53 (H) 0.44 - 1.00 mg/dL Final  05/29/2022 07:15 AM 1.65 (H) 0.44 - 1.00 mg/dL Final  04/05/2022 10:09 AM 1.66 (H) 0.44 - 1.00 mg/dL Final  03/30/2022 03:40 AM 1.85 (H) 0.44 - 1.00 mg/dL Final  03/26/2022  11:28 PM 1.66 (H) 0.44 - 1.00 mg/dL Final  03/25/2022 06:07 AM 1.99 (H) 0.44 - 1.00 mg/dL Final  03/24/2022 12:46 AM 1.83 (H) 0.44 - 1.00 mg/dL Final  03/23/2022 01:06 PM 1.78 (H) 0.44 - 1.00 mg/dL Final  03/23/2022 01:50 AM 1.26 (H) 0.44 - 1.00 mg/dL Final    Comment:    POST-ULTRACENTRIFUGATION  03/20/2022 12:58 PM 1.55 (H) 0.44 - 1.00 mg/dL Final  03/18/2022 03:18 AM 2.29 (H) 0.44 - 1.00 mg/dL Final  03/17/2022 04:44 AM 2.80 (H) 0.44 - 1.00 mg/dL Final  03/16/2022 06:25 AM 3.21 (H) 0.44 - 1.00 mg/dL Final  03/15/2022 03:53 AM 2.48 (H) 0.44 - 1.00 mg/dL Final  03/14/2022 02:56 AM 1.87 (H) 0.44 - 1.00 mg/dL Final  03/04/2022 05:59 PM 1.85 (H) 0.44 - 1.00 mg/dL Final  02/13/2022 08:59 AM 2.63 (H) 0.44 - 1.00 mg/dL Final  02/12/2022 07:11 AM 3.40 (H) 0.44 - 1.00 mg/dL Final    Comment:    POST-ULTRACENTRIFUGATION  02/11/2022 04:36 AM 2.73 (H) 0.44 - 1.00 mg/dL Final  02/10/2022 04:48 AM 2.33 (H) 0.44 - 1.00 mg/dL Final  02/09/2022 04:49 AM 2.55 (H) 0.44 - 1.00 mg/dL Final    Comment:    POST-ULTRACENTRIFUGATION  02/08/2022 05:00 PM 2.48 (H) 0.44 - 1.00 mg/dL Final    Comment:    POST-ULTRACENTRIFUGATION  02/08/2022 04:40 AM 2.29 (H) 0.44 - 1.00 mg/dL Final  02/07/2022 05:29 AM 2.30 (H) 0.44 - 1.00 mg/dL Final  02/06/2022 07:05 PM 2.07 (H) 0.44 - 1.00 mg/dL Final    Comment:    POST-ULTRACENTRIFUGATION  02/06/2022 08:43 AM 2.19 (H) 0.44 - 1.00 mg/dL Final    Comment:    POST-ULTRACENTRIFUGATION  02/05/2022 11:10 PM 2.32 (H) 0.44 - 1.00 mg/dL Final    Comment:    POST-ULTRACENTRIFUGATION  02/03/2022 04:41 PM 1.99 (H) 0.44 - 1.00 mg/dL Final    Comment:    POST-ULTRACENTRIFUGATION  01/29/2022 11:27 PM 2.33 (H) 0.44 - 1.00 mg/dL Final  01/26/2022 10:49 AM 2.10 (H) 0.44 - 1.00 mg/dL Final  12/12/2021 12:52 PM 2.44 (H) 0.44 - 1.00 mg/dL Final  12/12/2021 01:53 AM 2.90 (H) 0.44 - 1.00 mg/dL Final    Comment:    POST-ULTRACENTRIFUGATION  12/11/2021 02:31 AM 3.23 (H) 0.44 - 1.00  mg/dL Final  12/10/2021 02:42 AM 2.39 (H) 0.44 - 1.00 mg/dL Final  12/09/2021 02:36 AM 2.38 (H) 0.44 - 1.00 mg/dL Final  12/08/2021 04:43 AM 2.29 (H) 0.44 - 1.00 mg/dL Final  12/07/2021 04:14 AM 3.33 (H) 0.44 - 1.00 mg/dL Final  12/06/2021 03:43 AM 2.48 (H) 0.44 - 1.00 mg/dL Final  12/05/2021 04:05 AM 2.50 (H) 0.44 - 1.00 mg/dL Final    Comment:    POST-ULTRACENTRIFUGATION  12/04/2021 02:33 PM 2.67 (H) 0.44 - 1.00 mg/dL Final  12/03/2021 10:23 AM 2.45 (H) 0.44 - 1.00 mg/dL Final  12/02/2021 12:19 PM 1.76 (H) 0.44 - 1.00 mg/dL Final   Recent Labs  Lab 05/30/22 0235 05/30/22 1905 05/31/22 0155 06/01/22 1038 06/01/22 1548 06/02/22 0208 06/02/22 0620 06/02/22 1009 06/03/22 0339 06/03/22 0705  NA 134* 136 133* 130* 133* 128* 121* 129* 129* 137  K 4.2 3.1* 4.4 7.1* 5.8* SPECIMEN  HEMOLYZED. HEMOLYSIS MAY AFFECT INTEGRITY OF RESULTS. SPECIMEN HEMOLYZED. HEMOLYSIS MAY AFFECT INTEGRITY OF RESULTS. 4.1 SPECIMEN HEMOLYZED. HEMOLYSIS MAY AFFECT INTEGRITY OF RESULTS. 3.8  CL 103 108 103 98 97* 96* 92*  --  98  --   CO2 18* 13* 20* 21* 22 19* 23  --  19*  --   GLUCOSE 313* 199* 163* 242* 256* 167* 69*  --  127*  --   BUN 36* '19 18 18 19 20 20  '$ --  21*  --   CREATININE 1.61* 1.22* 1.32* 1.59* 1.74* 1.63* 1.07*  --  1.90*  --   CALCIUM 8.1* 6.6* 7.8* 8.4* 8.3* 7.8* 7.5*  --  7.1*  --   PHOS 1.8* 3.1 3.6 2.7  --  2.3*  --   --  3.4  --     Recent Labs  Lab 05/29/22 0715 05/29/22 1400 06/01/22 1510 06/01/22 1822 06/02/22 0208 06/02/22 1009 06/03/22 0339 06/03/22 0705  WBC 12.0*   < > 6.6 5.9 6.8  --  9.0  --   NEUTROABS 10.4*  --   --  3.8  --   --   --   --   HGB 11.1*   < > 6.7* 7.6* 8.3* 9.2* 7.2* 9.9*  HCT 30.3*   < > 21.8* 24.2* 24.4* 27.0* 24.0* 29.0*  MCV 86.6   < > 90.5 90.0 87.5  --  89.6  --   PLT 206   < > 120* 158 174  --  166  --    < > = values in this interval not displayed.    Liver Function Tests: Recent Labs  Lab 06/02/22 0208 06/02/22 1538 06/03/22 0339   AST SPECIMEN HEMOLYZED. HEMOLYSIS MAY AFFECT INTEGRITY OF RESULTS. 110* 142*  ALT SPECIMEN HEMOLYZED. HEMOLYSIS MAY AFFECT INTEGRITY OF RESULTS. 78* 76*  ALKPHOS 152* 176* 164*  BILITOT 5.1* 2.7* 3.9*  PROT SPECIMEN HEMOLYZED. HEMOLYSIS MAY AFFECT INTEGRITY OF RESULTS. 5.3* 5.5*  ALBUMIN 2.5* 2.4* 2.2*    Recent Labs  Lab 05/29/22 0715 05/30/22 0235 06/01/22 1822  LIPASE 76* 21 26    No results for input(s): "AMMONIA" in the last 168 hours. Cardiac Enzymes: No results for input(s): "CKTOTAL", "CKMB", "CKMBINDEX", "TROPONINI" in the last 168 hours. Iron Studies:  Recent Labs    06/02/22 1538  IRON 66  TIBC 245*  FERRITIN 343*   PT/INR: '@LABRCNTIP'$ (inr:5)  Xrays/Other Studies: ) Results for orders placed or performed during the hospital encounter of 05/29/22 (from the past 48 hour(s))  CBC     Status: Abnormal   Collection Time: 06/01/22  8:01 AM  Result Value Ref Range   WBC 6.6 4.0 - 10.5 K/uL   RBC 2.63 (L) 3.87 - 5.11 MIL/uL   Hemoglobin 7.7 (L) 12.0 - 15.0 g/dL   HCT 24.3 (L) 36.0 - 46.0 %   MCV 92.4 80.0 - 100.0 fL   MCH 29.3 26.0 - 34.0 pg   MCHC 31.7 30.0 - 36.0 g/dL    Comment: CORRECTED FOR LIPEMIA   RDW 18.1 (H) 11.5 - 15.5 %   Platelets 134 (L) 150 - 400 K/uL    Comment: Immature Platelet Fraction may be clinically indicated, consider ordering this additional test [id number] REPEATED TO VERIFY    nRBC 0.7 (H) 0.0 - 0.2 %    Comment: Performed at [facility], [address], [place], [place] [address]  Magnesium     Status: None   Collection Time: 06/01/22 10:38 AM  Result Value Ref Range   Magnesium 1.9 1.7 - 2.4 mg/dL    Comment: POST-ULTRACENTRIFUGATION Performed at [facility], [place] [address], [place], [place] [address]   Phosphorus     Status: None   Collection Time: 06/01/22 10:38 AM  Result Value Ref Range   Phosphorus 2.7 2.5 - 4.6 mg/dL    Comment: POST-ULTRACENTRIFUGATION Performed at [facility]  [address] [place], [place] [address]   Comprehensive metabolic panel     Status: Abnormal   Collection Time: 06/01/22 10:38 AM  Result Value Ref Range   Sodium 130 (L) 135 - 145 mmol/L    Comment: POST-ULTRACENTRIFUGATION   Potassium 7.1 (HH) 3.5 - 5.1 mmol/L    Comment: CRITICAL RESULT CALLED TO, READ BACK BY AND VERIFIED WITH [employer], RN 1240 06/01/22 L. KLAR HEMOLYSIS AT THIS LEVEL MAY AFFECT RESULT POST-ULTRACENTRIFUGATION    Chloride 98 98 - 111 mmol/L    Comment: POST-ULTRACENTRIFUGATION   CO2 21 (L) 22 - 32 mmol/L    Comment: POST-ULTRACENTRIFUGATION   Glucose, Bld 242 (H) 70 - 99 mg/dL    Comment: Glucose reference range applies only to samples taken after fasting for at least 8 hours. POST-ULTRACENTRIFUGATION    BUN 18 6 - 20 mg/dL    Comment: POST-ULTRACENTRIFUGATION   Creatinine, Ser 1.59 (H) 0.44 - 1.00 mg/dL    Comment: POST-ULTRACENTRIFUGATION   Calcium 8.4 (L) 8.9 - 10.3 mg/dL    Comment: POST-ULTRACENTRIFUGATION   Total Protein >12.0 (H) 6.5 - 8.1 g/dL    Comment: THIS IS THE SECOND RECOLLECT. POST-ULTRACENTRIFUGATION    Albumin 2.7 (L) 3.5 - 5.0 g/dL    Comment: POST-ULTRACENTRIFUGATION   AST 172 (H) 15 - 41 U/L    Comment: POST-ULTRACENTRIFUGATION   ALT 88 (H) 0 - 44 U/L    Comment: POST-ULTRACENTRIFUGATION   Alkaline Phosphatase 153 (H) 38 - 126 U/L    Comment: POST-ULTRACENTRIFUGATION   Total Bilirubin 3.0 (H) 0.3 - 1.2 mg/dL    Comment: POST-ULTRACENTRIFUGATION   GFR, Estimated 44 (L) >60 mL/min    Comment: POST-ULTRACENTRIFUGATION (NOTE) Calculated using the CKD-EPI Creatinine Equation (2021)    Anion gap 11 5 - 15    Comment: POST-ULTRACENTRIFUGATION Performed at [facility] [address], [place], [place] [address]   Glucose, capillary     Status: Abnormal   Collection Time: 06/01/22 11:43 AM  Result Value Ref Range   Glucose-Capillary 286 (H) 70 - 99 mg/dL    Comment: Glucose reference range applies only to samples taken after  fasting for at least 8 hours.  Glucose, capillary     Status: Abnormal   Collection Time: 06/01/22  1:12 PM  Result Value Ref Range   Glucose-Capillary 297 (H) 70 - 99 mg/dL    Comment: Glucose reference range applies only to samples taken after fasting for at least 8 hours.  Glucose, capillary     Status: Abnormal   Collection Time: 06/01/22  1:47 PM  Result Value Ref Range   Glucose-Capillary 276 (H) 70 - 99 mg/dL    Comment: Glucose reference range applies only to samples taken after fasting for at least 8 hours.  CBC     Status: Abnormal   Collection Time: 06/01/22  3:10 PM  Result Value Ref Range   WBC 6.6 4.0 - 10.5 K/uL   RBC 2.41 (L) 3.87 - 5.11 MIL/uL   Hemoglobin 6.7 (LL) 12.0 - 15.0 g/dL    Comment: REPEATED TO VERIFY  THIS CRITICAL RESULT HAS VERIFIED AND BEEN CALLED TO C MCGUINESS RN BY BONNIE DAVIS ON 03 06 2024 AT 1649, AND HAS BEEN READ BACK.     HCT 21.8 (L) 36.0 - 46.0 %   MCV 90.5 80.0 - 100.0 fL   MCH 27.8 26.0 - 34.0 pg   MCHC 30.7 30.0 - 36.0 g/dL    Comment: CORRECTED FOR LIPEMIA   RDW 17.7 (H) 11.5 - 15.5 %   Platelets 120 (L) 150 - 400 K/uL   nRBC 3.1 (H) 0.0 - 0.2 %    Comment: Performed at [facility] [address] [place], [place] [address]  Troponin I (High Sensitivity)     Status: None   Collection Time: 06/01/22  3:10 PM  Result Value Ref Range   Troponin I (High Sensitivity) 16 <18 ng/L    Comment: (NOTE) Elevated high sensitivity troponin I (hsTnI) values and significant  changes across serial measurements may suggest ACS but many other  chronic and acute conditions are known to elevate hsTnI results.  Refer to the "Links" section for chest pain algorithms and additional  guidance. Performed at [facility], [place] [address] [place], [place] [address]   Glucose, capillary     Status: Abnormal   Collection Time: 06/01/22  3:17 PM  Result Value Ref Range   Glucose-Capillary 245 (H) 70 - 99 mg/dL    Comment: Glucose reference  range applies only to samples taken after fasting for at least 8 hours.  Basic metabolic panel     Status: Abnormal   Collection Time: 06/01/22  3:48 PM  Result Value Ref Range   Sodium 133 (L) 135 - 145 mmol/L    Comment: POST-ULTRACENTRIFUGATION   Potassium 5.8 (H) 3.5 - 5.1 mmol/L    Comment: POST-ULTRACENTRIFUGATION HEMOLYSIS AT THIS LEVEL MAY AFFECT RESULT DELTA CHECK NOTED    Chloride 97 (L) 98 - 111 mmol/L    Comment: POST-ULTRACENTRIFUGATION   CO2 22 22 - 32 mmol/L    Comment: POST-ULTRACENTRIFUGATION   Glucose, Bld 256 (H) 70 - 99 mg/dL    Comment: Glucose reference range applies only to samples taken after fasting for at least 8 hours. POST-ULTRACENTRIFUGATION    BUN 19 6 - 20 mg/dL    Comment: POST-ULTRACENTRIFUGATION   Creatinine, Ser 1.74 (H) 0.44 - 1.00 mg/dL    Comment: POST-ULTRACENTRIFUGATION   Calcium 8.3 (L) 8.9 - 10.3 mg/dL    Comment: POST-ULTRACENTRIFUGATION   GFR, Estimated 40 (L) >60 mL/min    Comment: (NOTE) Calculated using the CKD-EPI Creatinine Equation (2021)    Anion gap 14 5 - 15    Comment: POST-ULTRACENTRIFUGATION Performed at [facility] [address] [place], [place] [address]   Glucose, capillary     Status: Abnormal   Collection Time: 06/01/22  4:08 PM  Result Value Ref Range   Glucose-Capillary 226 (H) 70 - 99 mg/dL    Comment: Glucose reference range applies only to samples taken after fasting for at least 8 hours.  Glucose, capillary     Status: Abnormal   Collection Time: 06/01/22  5:06 PM  Result Value Ref Range   Glucose-Capillary 252 (H) 70 - 99 mg/dL    Comment: Glucose reference range applies only to samples taken after fasting for at least 8 hours.  Type and screen [place]     Status: None   Collection Time: 06/01/22  6:15 PM  Result Value Ref Range   ABO/RH(D)  A POS    Antibody Screen NEG    Sample Expiration 06/04/2022,2359    Unit Number [id number]    Blood Component Type RBC LR PHER2     Unit division 00    Status of Unit ISSUED,FINAL    Transfusion Status OK TO TRANSFUSE    Crossmatch Result      Compatible Performed at [facility], [place] [address], [place], [place] [address]   Glucose, capillary     Status: Abnormal   Collection Time: 06/01/22  6:21 PM  Result Value Ref Range   Glucose-Capillary 246 (H) 70 - 99 mg/dL    Comment: Glucose reference range applies only to samples taken after fasting for at least 8 hours.  Troponin I (High Sensitivity)     Status: None   Collection Time: 06/01/22  6:22 PM  Result Value Ref Range   Troponin I (High Sensitivity) 14 <18 ng/L    Comment: (NOTE) Elevated high sensitivity troponin I (hsTnI) values and significant  changes across serial measurements may suggest ACS but many other  chronic and acute conditions are known to elevate hsTnI results.  Refer to the Links section for chest pain algorithms and additional  guidance. Performed at [facility], [place] [address] [place], [place] [address]   Lactate dehydrogenase     Status: Abnormal   Collection Time: 06/01/22  6:22 PM  Result Value Ref Range   LDH 1,528 (H) 98 - 192 U/L    Comment: POST-ULTRACENTRIFUGATION Performed at [facility], [place] [address], [place], [place] [address]   Differential     Status: None   Collection Time: 06/01/22  6:22 PM  Result Value Ref Range   Neutrophils Relative % 63 %   Neutro Abs 3.8 1.7 - 7.7 K/uL   Lymphocytes Relative 25 %   Lymphs Abs 1.5 0.7 - 4.0 K/uL   Monocytes Relative 6 %   Monocytes Absolute 0.4 0.1 - 1.0 K/uL   Eosinophils Relative 4 %   Eosinophils Absolute 0.2 0.0 - 0.5 K/uL   Basophils Relative 1 %   Basophils Absolute 0.0 0.0 - 0.1 K/uL   Immature Granulocytes 1 %   Abs Immature Granulocytes 0.05 0.00 - 0.07 K/uL   Reactive, Benign Lymphocytes PRESENT    Stomatocytes PRESENT    Giant PLTs PRESENT     Comment: Performed at [facility], [place] [address] [place], [place] [address]   Hepatic function panel     Status: Abnormal   Collection Time: 06/01/22  6:22 PM  Result Value Ref Range   Total Protein RESULTS UNAVAILABLE DUE TO INTERFERING SUBSTANCE 6.5 - 8.1 g/dL    Comment: CALLED TO [facility] 2115 06/01/2022 WBOND   Albumin 2.7 (L) 3.5 - 5.0 g/dL    Comment: POST-ULTRACENTRIFUGATION   AST RESULTS UNAVAILABLE DUE TO INTERFERING SUBSTANCE 15 - 41 U/L    Comment: CALLED TO D FIKES,RN 2115 06/01/2022 WBOND   ALT RESULTS UNAVAILABLE DUE TO INTERFERING SUBSTANCE 0 - 44 U/L    Comment: CALLED TO D FIKES,RN 2115 06/01/2022 WBOND   Alkaline Phosphatase 154 (H) 38 - 126 U/L    Comment: POST-ULTRACENTRIFUGATION   Total Bilirubin 4.8 (H) 0.3 - 1.2 mg/dL    Comment: RESULTS CONFIRMED BY MANUAL DILUTION   Bilirubin, Direct 2.5 (H) 0.0 - 0.2 mg/dL    Comment: RESULTS CONFIRMED BY MANUAL DILUTION   Indirect Bilirubin 2.3 (H) 0.3 - 0.9 mg/dL    Comment: Performed  at [facility], [place] [address], [place], [place] [address]  Lipase, blood     Status: None   Collection Time: 06/01/22  6:22 PM  Result Value Ref Range   Lipase 26 11 - 51 U/L    Comment: POST-ULTRACENTRIFUGATION Performed at [facility] [address] [place], [place] [address]   Haptoglobin     Status: None   Collection Time: 06/01/22  6:22 PM  Result Value Ref Range   Haptoglobin 262 33 - 278 mg/dL    Comment: (NOTE) Performed At: [facility] [place], [place] [id number] Rush Farmer MD [id number]   Beta-hydroxybutyric acid     Status: None   Collection Time: 06/01/22  6:22 PM  Result Value Ref Range   Beta-Hydroxybutyric Acid 0.26 0.05 - 0.27 mmol/L    Comment: Performed at [facility], [place] [address] [place], [place] [address]  CBC     Status: Abnormal   Collection Time: 06/01/22  6:22 PM  Result Value Ref Range   WBC 5.9 4.0 - 10.5 K/uL   RBC 2.69 (L) 3.87 - 5.11 MIL/uL   Hemoglobin 7.6 (L) 12.0 - 15.0 g/dL    Comment: REPEATED TO  VERIFY CORRECTED FOR LIPEMIA    HCT 24.2 (L) 36.0 - 46.0 %   MCV 90.0 80.0 - 100.0 fL   MCH 28.3 26.0 - 34.0 pg   MCHC 31.4 30.0 - 36.0 g/dL    Comment: REPEATED TO VERIFY CORRECTED FOR LIPEMIA    RDW 18.1 (H) 11.5 - 15.5 %   Platelets 158 150 - 400 K/uL    Comment: REPEATED TO VERIFY   nRBC 0.0 0.0 - 0.2 %    Comment: Performed at [facility], [place] [address], [place], [place] [address]  Pathologist smear review     Status: None   Collection Time: 06/01/22  6:22 PM  Result Value Ref Range   Path Review Mild anemia     Comment: Negative for morphologic leukocytic abnormality Negative for blasts Reviewed by Reesa Chew. [place]lesimer, M.D. 06/01/2022 Performed at [facility], [place] [address], [place], [place] [address]   Technologist smear review     Status: None   Collection Time: 06/01/22  6:24 PM  Result Value Ref Range   WBC MORPHOLOGY ATYPICAL MONONUCLEAR CELLS    RBC MORPHOLOGY STOMATOCYTES    Plt Morphology PLATELETS APPEAR ADEQUATE     Comment: GIANT PLATELETS SEEN   Clinical Information anemia, rule out hemolysis     Comment: Performed at [facility], [place] [address], [place], [place] [address]  Prepare RBC (crossmatch)     Status: None   Collection Time: 06/01/22  6:24 PM  Result Value Ref Range   Order Confirmation      ORDER PROCESSED BY BLOOD BANK Performed at [facility], [place] [address], [place], [place] [address]   Glucose, capillary     Status: Abnormal   Collection Time: 06/01/22  7:50 PM  Result Value Ref Range   Glucose-Capillary 244 (H) 70 - 99 mg/dL    Comment: Glucose reference range applies only to samples taken after fasting for at least 8 hours.  Glucose, capillary     Status: Abnormal   Collection Time: 06/01/22  9:02 PM  Result Value Ref Range   Glucose-Capillary 257 (H) 70 - 99 mg/dL    Comment: Glucose reference range applies only to samples taken after fasting for at  least 8 hours.  Glucose, capillary     Status: Abnormal    Collection Time: 06/01/22 11:11 PM  Result Value Ref Range   Glucose-Capillary 243 (H) 70 - 99 mg/dL    Comment: Glucose reference range applies only to samples taken after fasting for at least 8 hours.  Glucose, capillary     Status: Abnormal   Collection Time: 06/02/22 12:17 AM  Result Value Ref Range   Glucose-Capillary 181 (H) 70 - 99 mg/dL    Comment: Glucose reference range applies only to samples taken after fasting for at least 8 hours.  Glucose, capillary     Status: Abnormal   Collection Time: 06/02/22  1:18 AM  Result Value Ref Range   Glucose-Capillary 174 (H) 70 - 99 mg/dL    Comment: Glucose reference range applies only to samples taken after fasting for at least 8 hours.  Glucose, capillary     Status: Abnormal   Collection Time: 06/02/22  1:55 AM  Result Value Ref Range   Glucose-Capillary 176 (H) 70 - 99 mg/dL    Comment: Glucose reference range applies only to samples taken after fasting for at least 8 hours.  CBC     Status: Abnormal   Collection Time: 06/02/22  2:08 AM  Result Value Ref Range   WBC 6.8 4.0 - 10.5 K/uL    Comment: WHITE COUNT CONFIRMED ON SMEAR   RBC 2.79 (L) 3.87 - 5.11 MIL/uL   Hemoglobin 8.3 (L) 12.0 - 15.0 g/dL    Comment: CORRECTED FOR LIPEMIA   HCT 24.4 (L) 36.0 - 46.0 %   MCV 87.5 80.0 - 100.0 fL   MCH 29.7 26.0 - 34.0 pg   MCHC 34.0 30.0 - 36.0 g/dL    Comment: CORRECTED FOR LIPEMIA   RDW 17.3 (H) 11.5 - 15.5 %   Platelets 174 150 - 400 K/uL   nRBC 0.0 0.0 - 0.2 %    Comment: Performed at [facility], [place] [address] [place], [place] [address]  Magnesium     Status: None   Collection Time: 06/02/22  2:08 AM  Result Value Ref Range   Magnesium 2.0 1.7 - 2.4 mg/dL    Comment: POST-ULTRACENTRIFUGATION Performed at [facility], [place] [address] [place], [place] [address]   Phosphorus     Status: Abnormal   Collection Time: 06/02/22  2:08 AM  Result Value Ref Range   Phosphorus 2.3 (L) 2.5 - 4.6 mg/dL    Comment:  POST-ULTRACENTRIFUGATION Performed at [facility], [place] [address], [place], [place] [address]   Comprehensive metabolic panel     Status: Abnormal   Collection Time: 06/02/22  2:08 AM  Result Value Ref Range   Sodium 128 (L) 135 - 145 mmol/L    Comment: POST-ULTRACENTRIFUGATION   Potassium  3.5 - 5.1 mmol/L    SPECIMEN HEMOLYZED. HEMOLYSIS MAY AFFECT INTEGRITY OF RESULTS.    Comment: SPOKE TO J.SKIEF,RN. [id number] 06/02/22   Chloride 96 (L) 98 - 111 mmol/L    Comment: POST-ULTRACENTRIFUGATION   CO2 19 (L) 22 - 32 mmol/L    Comment: POST-ULTRACENTRIFUGATION   Glucose, Bld 167 (H) 70 - 99 mg/dL    Comment: POST-ULTRACENTRIFUGATION Glucose reference range applies only to samples taken after fasting for at least 8 hours.    BUN 20 6 - 20 mg/dL    Comment: POST-ULTRACENTRIFUGATION   Creatinine, Ser 1.63 (H) 0.44 - 1.00 mg/dL    Comment: POST-ULTRACENTRIFUGATION   Calcium  7.8 (L) 8.9 - 10.3 mg/dL    Comment: POST-ULTRACENTRIFUGATION   Total Protein  6.5 - 8.1 g/dL    SPECIMEN HEMOLYZED. HEMOLYSIS MAY AFFECT INTEGRITY OF RESULTS.    Comment: SPOKE TO J.SKIEF,RN. 0320 06/02/22   Albumin 2.5 (L) 3.5 - 5.0 g/dL    Comment: POST-ULTRACENTRIFUGATION   AST  15 - 41 U/L    SPECIMEN HEMOLYZED. HEMOLYSIS MAY AFFECT INTEGRITY OF RESULTS.    Comment: SPOKE TO J.SKIEF,RN. 0320 06/02/22   ALT  0 - 44 U/L    SPECIMEN HEMOLYZED. HEMOLYSIS MAY AFFECT INTEGRITY OF RESULTS.    Comment: SPOKE TO J.SKIEF,RN. 0320 06/02/22   Alkaline Phosphatase 152 (H) 38 - 126 U/L    Comment: POST-ULTRACENTRIFUGATION   Total Bilirubin 5.1 (H) 0.3 - 1.2 mg/dL    Comment: POST-ULTRACENTRIFUGATION   GFR, Estimated 43 (L) >60 mL/min    Comment: (NOTE) Calculated using the CKD-EPI Creatinine Equation (2021)    Anion gap 13 5 - 15    Comment: POST-ULTRACENTRIFUGATION Performed at [facility] [address] [place], [place] [address]   Triglycerides     Status: Abnormal   Collection Time: 06/02/22  2:08 AM   Result Value Ref Range   Triglycerides >5,000 (H) <150 mg/dL    Comment: RESULT CONFIRMED BY MANUAL DILUTION Performed at [facility], [place] [address] [place], [place] [address]   Glucose, capillary     Status: Abnormal   Collection Time: 06/02/22  3:00 AM  Result Value Ref Range   Glucose-Capillary 150 (H) 70 - 99 mg/dL    Comment: Glucose reference range applies only to samples taken after fasting for at least 8 hours.  Glucose, capillary     Status: Abnormal   Collection Time: 06/02/22  4:10 AM  Result Value Ref Range   Glucose-Capillary 139 (H) 70 - 99 mg/dL    Comment: Glucose reference range applies only to samples taken after fasting for at least 8 hours.  Glucose, capillary     Status: Abnormal   Collection Time: 06/02/22  5:14 AM  Result Value Ref Range   Glucose-Capillary 112 (H) 70 - 99 mg/dL    Comment: Glucose reference range applies only to samples taken after fasting for at least 8 hours.  Glucose, capillary     Status: None   Collection Time: 06/02/22  5:51 AM  Result Value Ref Range   Glucose-Capillary 92 70 - 99 mg/dL    Comment: Glucose reference range applies only to samples taken after fasting for at least 8 hours.   Comment 1 QC Due   Direct antiglobulin test (not at [facility])     Status: None   Collection Time: 06/02/22  6:14 AM  Result Value Ref Range   DAT, complement NEG    DAT, IgG      NEG Performed at [facility] [address], [place], [place] [id number]   Basic metabolic panel     Status: Abnormal   Collection Time: 06/02/22  6:20 AM  Result Value Ref Range   Sodium 121 (L) 135 - 145 mmol/L    Comment: DELTA CHECK NOTED   Potassium  3.5 - 5.1 mmol/L    SPECIMEN HEMOLYZED. HEMOLYSIS MAY AFFECT INTEGRITY OF RESULTS.    Comment: SPOKE TO Verdie Drown, RN 0730 06/02/22 L.KLAR   Chloride 92 (L) 98 - 111 mmol/L   CO2 23 22 - 32 mmol/L   Glucose, Bld 69 (L) 70 - 99 mg/dL  Comment: Glucose reference range applies only to samples taken  after fasting for at least 8 hours.   BUN 20 6 - 20 mg/dL   Creatinine, Ser 1.07 (H) 0.44 - 1.00 mg/dL   Calcium 7.5 (L) 8.9 - 10.3 mg/dL   GFR, Estimated >60 >60 mL/min    Comment: (NOTE) Calculated using the CKD-EPI Creatinine Equation (2021)    Anion gap 6 5 - 15    Comment: Performed at [facility] [address], [place], [place] [address]  Glucose, capillary     Status: Abnormal   Collection Time: 06/02/22 10:08 AM  Result Value Ref Range   Glucose-Capillary 120 (H) 70 - 99 mg/dL    Comment: Glucose reference range applies only to samples taken after fasting for at least 8 hours.  POCT I-Stat EG7     Status: Abnormal   Collection Time: 06/02/22 10:09 AM  Result Value Ref Range   pH, Ven 7.364 7.25 - 7.43   pCO2, Ven 42.6 (L) 44 - 60 mmHg   pO2, Ven 21 (LL) 32 - 45 mmHg   Bicarbonate 24.4 20.0 - 28.0 mmol/L   TCO2 26 22 - 32 mmol/L   O2 Saturation 33 %   Acid-base deficit 1.0 0.0 - 2.0 mmol/L   Sodium 129 (L) 135 - 145 mmol/L   Potassium 4.1 3.5 - 5.1 mmol/L   Calcium, Ion 1.15 1.15 - 1.40 mmol/L   HCT 27.0 (L) 36.0 - 46.0 %   Hemoglobin 9.2 (L) 12.0 - 15.0 g/dL   Patient temperature 98.1 F    Sample type VENOUS   Reticulocytes     Status: Abnormal   Collection Time: 06/02/22  3:38 PM  Result Value Ref Range   Retic Ct Pct 1.7 0.4 - 3.1 %   RBC. 2.71 (L) 3.87 - 5.11 MIL/uL   Retic Count, Absolute 44.7 19.0 - 186.0 K/uL   Immature Retic Fract 15.2 2.3 - 15.9 %    Comment: Performed at [facility], [place] [address] [place], [place] [address]  Iron and TIBC     Status: Abnormal   Collection Time: 06/02/22  3:38 PM  Result Value Ref Range   Iron 66 28 - 170 ug/dL   TIBC 245 (L) 250 - 450 ug/dL   Saturation Ratios 27 10.4 - 31.8 %   UIBC 179 ug/dL    Comment: Performed at [facility], [place] [address], [place], [place] [address]  Vitamin B12     Status: Abnormal   Collection Time: 06/02/22  3:38 PM  Result Value Ref Range   Vitamin B-12 1,521 (H)  180 - 914 pg/mL    Comment: RESULT CONFIRMED BY AUTOMATED DILUTION (NOTE) This assay is not validated for testing neonatal or myeloproliferative syndrome specimens for Vitamin B12 levels. Performed at [facility], [place] [address], [place], [place] [address]   Folate     Status: None   Collection Time: 06/02/22  3:38 PM  Result Value Ref Range   Folate 13.7 >5.9 ng/mL    Comment: POST-ULTRACENTRIFUGATION Performed at [facility], [place] [address] [place], [place] [address]   Ferritin     Status: Abnormal   Collection Time: 06/02/22  3:38 PM  Result Value Ref Range   Ferritin 343 (H) 11 - 307 ng/mL    Comment: Performed at [facility], [place] [address] [place], [place] [address]  Hepatic function panel     Status: Abnormal  Collection Time: 06/02/22  3:38 PM  Result Value Ref Range   Total Protein 5.3 (L) 6.5 - 8.1 g/dL    Comment: POST-ULTRACENTRIFUGATION   Albumin 2.4 (L) 3.5 - 5.0 g/dL    Comment: POST-ULTRACENTRIFUGATION   AST 110 (H) 15 - 41 U/L    Comment: POST-ULTRACENTRIFUGATION   ALT 78 (H) 0 - 44 U/L    Comment: POST-ULTRACENTRIFUGATION   Alkaline Phosphatase 176 (H) 38 - 126 U/L    Comment: POST-ULTRACENTRIFUGATION   Total Bilirubin 2.7 (H) 0.3 - 1.2 mg/dL    Comment: POST-ULTRACENTRIFUGATION   Bilirubin, Direct 1.3 (H) 0.0 - 0.2 mg/dL    Comment: POST-ULTRACENTRIFUGATION   Indirect Bilirubin 1.4 (H) 0.3 - 0.9 mg/dL    Comment: POST-ULTRACENTRIFUGATION Performed at [facility] [address], [place], [place] [address]   Glucose, capillary     Status: None   Collection Time: 06/02/22  8:24 PM  Result Value Ref Range   Glucose-Capillary 86 70 - 99 mg/dL    Comment: Glucose reference range applies only to samples taken after fasting for at least 8 hours.  Glucose, capillary     Status: None   Collection Time: 06/02/22  8:25 PM  Result Value Ref Range   Glucose-Capillary 99 70 - 99 mg/dL    Comment: Glucose reference range applies  only to samples taken after fasting for at least 8 hours.  Glucose, capillary     Status: Abnormal   Collection Time: 06/02/22 11:06 PM  Result Value Ref Range   Glucose-Capillary 101 (H) 70 - 99 mg/dL    Comment: Glucose reference range applies only to samples taken after fasting for at least 8 hours.  Glucose, capillary     Status: Abnormal   Collection Time: 06/03/22 12:58 AM  Result Value Ref Range   Glucose-Capillary 45 (L) 70 - 99 mg/dL    Comment: Glucose reference range applies only to samples taken after fasting for at least 8 hours.  Glucose, capillary     Status: None   Collection Time: 06/03/22  1:01 AM  Result Value Ref Range   Glucose-Capillary 72 70 - 99 mg/dL    Comment: Glucose reference range applies only to samples taken after fasting for at least 8 hours.  Glucose, capillary     Status: Abnormal   Collection Time: 06/03/22  1:24 AM  Result Value Ref Range   Glucose-Capillary 65 (L) 70 - 99 mg/dL    Comment: Glucose reference range applies only to samples taken after fasting for at least 8 hours.  Glucose, capillary     Status: None   Collection Time: 06/03/22  1:42 AM  Result Value Ref Range   Glucose-Capillary 73 70 - 99 mg/dL    Comment: Glucose reference range applies only to samples taken after fasting for at least 8 hours.  Glucose, capillary     Status: None   Collection Time: 06/03/22  1:44 AM  Result Value Ref Range   Glucose-Capillary 83 70 - 99 mg/dL    Comment: Glucose reference range applies only to samples taken after fasting for at least 8 hours.  Glucose, capillary     Status: None   Collection Time: 06/03/22  2:07 AM  Result Value Ref Range   Glucose-Capillary 91 70 - 99 mg/dL    Comment: Glucose reference range applies only to samples taken after fasting for at least 8 hours.  Glucose, capillary     Status: Abnormal   Collection Time: 06/03/22  2:56 AM  Result Value Ref Range   Glucose-Capillary 110 (H) 70 - 99 mg/dL    Comment: Glucose  reference range applies only to samples taken after fasting for at least 8 hours.  CBC     Status: Abnormal   Collection Time: 06/03/22  3:39 AM  Result Value Ref Range   WBC 9.0 4.0 - 10.5 K/uL   RBC 2.68 (L) 3.87 - 5.11 MIL/uL   Hemoglobin 7.2 (L) 12.0 - 15.0 g/dL   HCT 24.0 (L) 36.0 - 46.0 %   MCV 89.6 80.0 - 100.0 fL   MCH 26.9 26.0 - 34.0 pg   MCHC 30.0 30.0 - 36.0 g/dL    Comment: CORRECTED FOR LIPEMIA   RDW 19.5 (H) 11.5 - 15.5 %   Platelets 166 150 - 400 K/uL    Comment: REPEATED TO VERIFY   nRBC 0.0 0.0 - 0.2 %    Comment: Performed at [facility], [place] [address], [place], [place] [address]  Magnesium     Status: None   Collection Time: 06/03/22  3:39 AM  Result Value Ref Range   Magnesium 1.8 1.7 - 2.4 mg/dL    Comment: POST-ULTRACENTRIFUGATION HEMOLYSIS AT THIS LEVEL MAY AFFECT RESULT Performed at [facility], [place] [address], [place], [place] [address]   Phosphorus     Status: None   Collection Time: 06/03/22  3:39 AM  Result Value Ref Range   Phosphorus 3.4 2.5 - 4.6 mg/dL    Comment: HEMOLYSIS AT THIS LEVEL MAY AFFECT RESULT POST-ULTRACENTRIFUGATION Performed at [facility], [place] [address], [place], [place] [address]   Comprehensive metabolic panel     Status: Abnormal   Collection Time: 06/03/22  3:39 AM  Result Value Ref Range   Sodium 129 (L) 135 - 145 mmol/L    Comment: POST-ULTRACENTRIFUGATION HEMOLYSIS AT THIS LEVEL MAY AFFECT RESULT    Potassium  3.5 - 5.1 mmol/L    SPECIMEN HEMOLYZED. HEMOLYSIS MAY AFFECT INTEGRITY OF RESULTS.    Comment: UNABLE TO REPORT THE RESULT DUE TO HEMOLYSIS. RESULTS DISCUSSED WITH SKIES,J RN [phone] 06/03/22 AMIREHSANI F   Chloride 98 98 - 111 mmol/L   CO2 19 (L) 22 - 32 mmol/L    Comment: POST-ULTRACENTRIFUGATION   Glucose, Bld 127 (H) 70 - 99 mg/dL    Comment: Glucose reference range applies only to samples taken after fasting for at least 8 hours. POST-ULTRACENTRIFUGATION    BUN 21 (H) 6 - 20 mg/dL     Comment: POST-ULTRACENTRIFUGATION   Creatinine, Ser 1.90 (H) 0.44 - 1.00 mg/dL    Comment: POST-ULTRACENTRIFUGATION   Calcium 7.1 (L) 8.9 - 10.3 mg/dL    Comment: POST-ULTRACENTRIFUGATION   Total Protein 5.5 (L) 6.5 - 8.1 g/dL    Comment: HEMOLYSIS AT THIS LEVEL MAY AFFECT RESULT POST-ULTRACENTRIFUGATION    Albumin 2.2 (L) 3.5 - 5.0 g/dL    Comment: POST-ULTRACENTRIFUGATION   AST 142 (H) 15 - 41 U/L    Comment: POST-ULTRACENTRIFUGATION HEMOLYSIS AT THIS LEVEL MAY AFFECT RESULT    ALT 76 (H) 0 - 44 U/L    Comment: POST-ULTRACENTRIFUGATION   Alkaline Phosphatase 164 (H) 38 - 126 U/L    Comment: POST-ULTRACENTRIFUGATION HEMOLYSIS AT THIS LEVEL MAY AFFECT RESULT    Total Bilirubin 3.9 (H) 0.3 - 1.2 mg/dL    Comment: HEMOLYSIS AT THIS LEVEL MAY AFFECT RESULT POST-ULTRACENTRIFUGATION    GFR, Estimated 36 (L) >60 mL/min    Comment: (NOTE) Calculated using the CKD-EPI  Creatinine Equation (2021)    Anion gap 12 5 - 15    Comment: Performed at [facility], [place] [address], [place], [place] [address]  Triglycerides     Status: Abnormal   Collection Time: 06/03/22  3:39 AM  Result Value Ref Range   Triglycerides 4,045 (H) <150 mg/dL    Comment: RESULT CONFIRMED BY MANUAL DILUTION Performed at [facility], [place] [address] [place], [place] [address]   Glucose, capillary     Status: Abnormal   Collection Time: 06/03/22  3:43 AM  Result Value Ref Range   Glucose-Capillary 129 (H) 70 - 99 mg/dL    Comment: Glucose reference range applies only to samples taken after fasting for at least 8 hours.  Glucose, capillary     Status: None   Collection Time: 06/03/22  5:06 AM  Result Value Ref Range   Glucose-Capillary 99 70 - 99 mg/dL    Comment: Glucose reference range applies only to samples taken after fasting for at least 8 hours.  Glucose, capillary     Status: None   Collection Time: 06/03/22  7:02 AM  Result Value Ref Range   Glucose-Capillary 84 70 - 99 mg/dL     Comment: Glucose reference range applies only to samples taken after fasting for at least 8 hours.  POCT I-Stat EG7     Status: Abnormal   Collection Time: 06/03/22  7:05 AM  Result Value Ref Range   pH, Ven 7.332 7.25 - 7.43   pCO2, Ven 36.4 (L) 44 - 60 mmHg   pO2, Ven 23 (LL) 32 - 45 mmHg   Bicarbonate 19.1 (L) 20.0 - 28.0 mmol/L   TCO2 20 (L) 22 - 32 mmol/L   O2 Saturation 33 %   Acid-base deficit 6.0 (H) 0.0 - 2.0 mmol/L   Sodium 137 135 - 145 mmol/L   Potassium 3.8 3.5 - 5.1 mmol/L   Calcium, Ion 1.01 (L) 1.15 - 1.40 mmol/L   HCT 29.0 (L) 36.0 - 46.0 %   Hemoglobin 9.9 (L) 12.0 - 15.0 g/dL   Patient temperature 100.0 F    Sample type VENOUS    Comment NOTIFIED PHYSICIAN    CT ABDOMEN PELVIS WO CONTRAST  Result Date: 06/01/2022 CLINICAL DATA:  anemia, rule out retroperitoneal bleed or hematoma EXAM: CT ABDOMEN AND PELVIS WITHOUT CONTRAST TECHNIQUE: Multidetector CT imaging of the abdomen and pelvis was performed following the standard protocol without IV contrast. RADIATION DOSE REDUCTION: This exam was performed according to the departmental dose-optimization program which includes automated exposure control, adjustment of the mA and/or kV according to patient size and/or use of iterative reconstruction technique. COMPARISON:  Chest XR, concurrent. CT AP, 05/29/2022 and 03/30/2022. CT CAP, 05/12/2022. FINDINGS: Lower chest: Cardiomegaly with LEFT ventricular enlargement, greater than expected in a patient of this age. Hypodensity of the cardiac blood pool. Ground-glass opacities and interlobular septal thickening at the lung bases. Trace LEFT pleural effusion. Hepatobiliary: Hepatomegaly. Diffuse hypodensity of liver. No gallstones, gallbladder wall thickening, or biliary dilatation. Pancreas: No pancreatic ductal dilatation or surrounding inflammatory changes. Spleen: Splenomegaly.  No focal abnormality. Adrenals/Urinary Tract: Adrenal glands are unremarkable. Kidneys are normal, without  renal calculi, focal lesion, or hydronephrosis. Mild distention of the urinary bladder. Stomach/Bowel: Stomach is within normal limits. Appendix is not definitively visualized. No evidence of bowel wall thickening, distention, or inflammatory changes. Vascular/Lymphatic: Mild burden of distal aortic atherosclerosis, though greater than expected in a patient of this age. No enlarged abdominal  or pelvic lymph nodes. Reproductive: Uterus and adnexa are unremarkable. Other: Rotund abdomen. Mild body wall edema. Multifocal anterior abdominal wall subcutaneous contusions, likely injection sites. Similar appearance of subcutaneous tissue thickening at the RIGHT groin and months pubis. No abdominopelvic ascites. Musculoskeletal: No acute or significant osseous findings. IMPRESSION: 1. No acute abdominopelvic process. 2. Cardiomegaly with LEFT ventricular enlargement, greater than expected in a patient of this age. Hypodensity of the cardiac blood pool as can be seen with anemia. 3. Bibasilar ground-glass opacities and interlobular septal thickening, suspicious for developing pulmonary edema. 4. Hepatomegaly and steatosis. Additional incidental, chronic and senescent findings as above. Electronically Signed   By: Michaelle Birks M.D.   On: 06/01/2022 19:09   [place] EKG SITE RITE  Result Date: 06/01/2022 If Site Rite image not attached, placement could not be confirmed due to current cardiac rhythm.  DG Chest Port 1 View  Result Date: 06/01/2022 CLINICAL DATA:  Shortness of breath. EXAM: PORTABLE CHEST 1 VIEW COMPARISON:  May 31, 2022. FINDINGS: Stable cardiomediastinal silhouette. Minimal bibasilar subsegmental atelectasis is noted. Bony thorax is unremarkable. IMPRESSION: Minimal bibasilar subsegmental atelectasis. Electronically Signed   By: Marijo Conception M.D.   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   CHF (congestive heart failure) ([facility])     Cholesterosis    DM (diabetes mellitus) ([facility]) 10/10/2018   Fatty liver    HTN (hypertension) 10/10/2018   Hypothyroidism 10/10/2018   Lipoprotein deficiency    Lung disease    longevity long term   Pancreatitis    Polycystic ovary syndrome     PSH:   Past Surgical History:  Procedure Laterality Date   ABDOMINAL SURGERY     pt states during miscarriage got her intestine   BRAIN SURGERY     EYE MUSCLE SURGERY Right 03/28/2014   PRESSURE SENSOR/CARDIOMEMS N/A 12/06/2021   Procedure: PRESSURE SENSOR/CARDIOMEMS;  Surgeon: [name], MD;  Location: [facility];  Service: Cardiovascular;  Laterality: N/A;   RIGHT HEART CATH N/A 08/05/2021   Procedure: RIGHT HEART CATH;  Surgeon: [name], MD;  Location: [facility];  Service: Cardiovascular;  Laterality: N/A;   RIGHT HEART CATH N/A 12/06/2021   Procedure: RIGHT HEART CATH;  Surgeon: [name], MD;  Location: [facility];  Service: Cardiovascular;  Laterality: N/A;   RIGHT/LEFT HEART CATH AND CORONARY ANGIOGRAPHY N/A 12/03/2020   Procedure: RIGHT/LEFT HEART CATH AND CORONARY ANGIOGRAPHY;  Surgeon: [name], MD;  Location: [facility];  Service: Cardiovascular;  Laterality: N/A;   VENTRICULOSTOMY  03/28/1997    Allergies:  Allergies  Allergen Reactions   Ketamine Other (See Comments)    In vegetative state for 15 minutes per pt   Erythromycin     unk   Fentanyl Nausea And Vomiting   Maitake Mushroom [[place]] Itching    Itchy throat   Penicillin V Itching    Gi upset   Shellfish Allergy Other (See Comments)    Pt has never had shellfish but tested positive on allergy test. Pt states contrast in CT is okay   Morphine Itching and Rash   Penicillins Rash    Has patient had a PCN reaction causing immediate rash, facial/tongue/throat swelling, SOB or lightheadedness with hypotension: Y Has patient had a PCN reaction causing severe rash involving mucus membranes or skin  necrosis: Y Has patient had a  PCN reaction that required hospitalization: N Has patient had a PCN reaction occurring within the last 10 years: Y If all of the above answers are "NO", then may proceed with Cephalosporin use.    Prednisone Rash    Medications:   Prior to Admission medications   Medication Sig Start Date End Date Taking? Authorizing Provider  albuterol (VENTOLIN HFA) 108 (90 Base) MCG/ACT inhaler Inhale 1-2 puffs into the lungs every 6 (six) hours as needed for wheezing or shortness of breath. 12/31/18  Yes [provider]  aspirin EC 81 MG EC tablet Take 1 tablet (81 mg total) by mouth daily. Swallow whole. 12/07/20  Yes Nicole Kindred A, DO  CREON 36000-114000 units CPEP capsule Take 36,000 Units by mouth 3 (three) times daily.   Yes [provider]  DULoxetine (CYMBALTA) 30 MG capsule Take 30 mg by mouth at bedtime. 01/28/19 01/27/23 Yes [provider]  ergocalciferol (VITAMIN D2) 1.25 MG (50000 UT) capsule Take 50,000 Units by mouth every 7 (seven) days.   Yes [provider]  Evolocumab (REPATHA) 140 MG/ML SOSY Inject 140 mg into the skin See admin instructions. Inject 140 mg subcutaneously every other Monday evening   Yes [provider]  fenofibrate 160 MG tablet Take 1 tablet (160 mg total) by mouth daily. 12/07/20  Yes Nicole Kindred A, DO  guaiFENesin-dextromethorphan (ROBITUSSIN DM) 100-10 MG/5ML syrup Take 10 mLs by mouth every 6 (six) hours as needed for cough. 03/25/22  Yes Regalado, Belkys A, MD  Insulin Regular Human (HUMULIN R U-500 KWIKPEN [place]) Inject 100-120 Units into the skin See admin instructions. Inject 290 units subcutaneous at every meal and 100 units subcutaneous at bedtime per patient   Yes [provider]  ivabradine (CORLANOR) 7.5 MG TABS tablet Take 1 tablet (7.5 mg total) by mouth 2 (two) times daily with a meal. 08/02/21  Yes [name], Shaune Pascal, MD  levocetirizine (XYZAL) 5 MG tablet Take 5 mg by  mouth daily. 03/05/22  Yes [provider]  levothyroxine (SYNTHROID) 125 MCG tablet Take 1 tablet (125 mcg total) by mouth daily at 6 (six) AM. 12/07/20  Yes Nicole Kindred A, DO  norethindrone-ethinyl estradiol-FE ([name] 1/20) 1-20 MG-MCG tablet Take 1 tablet by mouth daily.   Yes [provider]  pantoprazole (PROTONIX) 40 MG tablet Take 40 mg by mouth daily. 12/24/19  Yes [provider]  pregabalin (LYRICA) 150 MG capsule Take 300 mg by mouth at bedtime.   Yes [provider]  prochlorperazine (COMPAZINE) 5 MG tablet Take 5 mg by mouth daily.   Yes [provider]  rosuvastatin (CRESTOR) 40 MG tablet Take 40 mg by mouth daily. 08/09/21  Yes [provider]  sildenafil (REVATIO) 20 MG tablet Take 1 tablet (20 mg total) by mouth 3 (three) times daily. 05/11/22  Yes [name], Shaune Pascal, MD  sodium bicarbonate 650 MG tablet Take 650 mg by mouth 2 (two) times daily.   Yes [provider]  Torsemide 40 MG TABS Take 60 mg by mouth daily. 04/26/22  Yes Clegg, [name] D, NP  tretinoin (RETIN-A) 0.05 % cream Apply 1 application topically at bedtime. 01/17/21  Yes [provider]  carvedilol (COREG) 3.125 MG tablet Take 1 tablet (3.125 mg total) by mouth 2 (two) times daily with a meal. Patient not taking: Reported on 05/30/2022 02/13/22 05/30/22  Donne Hazel, MD  ciprofloxacin (CIPRO) 500 MG tablet Take 500 mg by mouth 2 (two) times daily. Patient not taking: Reported  on 05/30/2022    [provider]  clindamycin (CLEOCIN) 150 MG capsule Take 450 mg by mouth 3 (three) times daily. Patient not taking: Reported on 05/30/2022 05/18/22   [provider]  Insulin Pen Needle 32G X 4 MM MISC 1 Device by Does not apply route QID. For use with insulin pens 02/13/22   Donne Hazel, MD  insulin regular human CONCENTRATED (HUMULIN R U-500 KWIKPEN) 500 UNIT/ML KwikPen Inject 120 Units into the skin 3 (three) times daily with meals.  06/01/22   Erick Colace, NP  nitrofurantoin, macrocrystal-monohydrate, (MACROBID) 100 MG capsule Take 1 capsule (100 mg total) by mouth every 12 (twelve) hours. 06/01/22   Erick Colace, NP    Discontinued Meds:   Medications Discontinued During This Encounter  Medication Reason   ondansetron (ZOFRAN) injection 4 mg    albuterol (VENTOLIN HFA) 108 (90 Base) MCG/ACT inhaler 1-2 puff    levocetirizine (XYZAL) tablet 5 mg    HYDROmorphone (DILAUDID) injection 1 mg    HYDROmorphone (DILAUDID) injection 1-2 mg    oxyCODONE (Oxy IR/ROXICODONE) immediate release tablet 5-10 mg    cefTRIAXone (ROCEPHIN) 1 g in sodium chloride 0.9 % 100 mL IVPB    ivabradine (CORLANOR) tablet 7.5 mg    norepinephrine (LEVOPHED) 4-5 MG/250ML-% infusion SOLN Returned to ADS   insulin regular, human (MYXREDLIN) 100 units/ 100 mL infusion    insulin regular human CONCENTRATED (HUMULIN R) 500 UNIT/ML KwikPen 150 Units    dextrose 5 % in lactated ringers infusion    insulin regular human CONCENTRATED (HUMULIN R) 500 UNIT/ML KwikPen 120 Units    lactated ringers infusion    NON FORMULARY 180 Units    insulin aspart (novoLOG) injection 0-9 Units    insulin regular human CONCENTRATED (HUMULIN R) 500 UNIT/ML KwikPen 180 Units    insulin aspart (novoLOG) injection 0-5 Units Change in therapy   insulin aspart (novoLOG) injection 0-9 Units Change in therapy   dextrose 5 % solution    dextrose 5 % solution    dextrose 5 %-0.9 % sodium chloride infusion    dextrose 10 % infusion    furosemide (LASIX) injection 40 mg    norethindrone-ethinyl estradiol-FE (LOESTRIN FE) 1-20 MG-MCG per tablet 1 tablet    heparin injection 5,000 Units    furosemide (LASIX) injection 40 mg    nitrofurantoin (macrocrystal-monohydrate) (MACROBID) capsule 100 mg     Social History:  reports that she has never smoked. She has never used smokeless tobacco. She reports that she does not drink alcohol and does not use drugs.  Family History:    Family History  Problem Relation Age of Onset   Diabetes Mother    Hypertension Mother    Hyperlipidemia Mother    Thyroid disease Mother    Hypertension Father    Diabetes Father    Pancreatic cancer Paternal Aunt    Pancreatic cancer Paternal Uncle    Colon cancer Neg Hx    Esophageal cancer Neg Hx    Stomach cancer Neg Hx     Blood pressure 111/68, pulse 78, temperature 98.5 F (36.9 C), temperature source Oral, resp. rate (!) 28, height '4\' 9"'$  (1.448 m), weight 69.6 kg, last menstrual period 04/23/2022, SpO2 92 %. General appearance: alert, cooperative, appears stated age, and no distress Eyes: negative findings: conjunctivae and sclerae normal, right eye nearly swollen shut, no scleral icterus Resp: clear to auscultation bilaterally and slightly tachypneaic Cardio: regular rate and rhythm, S1, S2 normal, no murmur,  click, rub or gallop GI: abnormal findings:  Soft but slightly distended, no TTP Extremities: SCD's on extremities with, no pitting edema       Holley Bouche, MD 06/03/2022, 7:52 AM

## 2022-06-03 NOTE — Progress Notes (Signed)
PROGRESS NOTE    Jennifer [name]  [id number] DOB: [birth year] DOA: 05/29/2022 PCP: Sue Lush, PA-C    Chief Complaint  Patient presents with   Abdominal Pain    Brief Narrative:   32 year old woman who presented to [facility] ED with abdominal pain, nausea and vomiting. PMHx significant for HTN, HLD, CHF (Echo 11/2021 EF 50-55%, no RWMAs, mildly elevated PASP), pulmonary HTN (s/p CardioMEMS implantation, on Corlanor and sildenafil), T2DM (followed by [facility]), NAFLD, hypothyroidism, astrocytoma, PCOS.   On presentation to ED, patient was found to have an anion gap with acidosis consistent with DKA/hyperglycemia. Was also found to have a TG > 5000.  Concern for ongoing pancreatitis.  CT A/P 3/3 without acute process, no e/o pancreatitis; +hepatosplenomegaly. -Patient's anion gap has closed, she was transitioned off insulin drip, but her workup was noted for evolving anemia, suspicious for hemolytic anemia with elevated LFTs, and LDH, as well patient with significant difficulty with lab accuracy likely in the setting of her hypertriglyceridemia.    Assessment & Plan:   Principal Problem:   Hypertriglyceridemia Active Problems:   Acute renal failure superimposed on stage 3a chronic kidney disease (HCC)   History of astrocytoma of brain   Type 2 diabetes mellitus with hyperlipidemia (HCC)   Hypothyroidism   Chronic systolic CHF (congestive heart failure) (HCC)   Essential hypertension   Nonischemic cardiomyopathy (HCC)   Elevated liver enzymes   Pulmonary hypertension (HCC)   NASH (nonalcoholic steatohepatitis)   UTI (urinary tract infection) due to Enterococcus   E. coli UTI (urinary tract infection)   DKA (diabetic ketoacidosis) (HCC)  Diabetic ketoacidosis, type 2 diabetes with hyperglycemia AGMA secondary to above -He is on insulin U-500 at home, reports she has been compliant, reports she ran out of Dexcom refills, so she was not checking her CBGs.. -Insulin  GTT DKA protocol has been discontinued after anion gap closed.  On 3/5. -Currently she is back on insulin drip for hypertriglyceridemia protocol.  [name] requiring IV fluid to maintain her CBG adequate enough for insulin drip, change from D5 NS to D10 W to minimize IV fluids intake.  hypertriglyceridemia Nausea, vomiting, abdominal pain History of chronic pancreatitis ?? multifactorial chylomicronemia syndrome -With known history of pancreatitis in the past due to a familial hypertriglyceridemia -[name] on home Repatha -Continue fenofibrate and statin. -started  on insulin gtt. for triglyceride> 5000, gastritis improving today at 4045, with insulin drip -Continue with Creon -Lipase within normal limit, no epigastric pain, no evidence of pancreatitis on imaging.  Anemia  - hemoglobin has been gradually dropping, quired 1 unit for hemoglobin 6.7, hemoglobin has been stable since.   -CT abdomen pelvis obtained, no evidence of retroperitoneal hematoma -Total bili, direct bili, LDH are elevated, concern for hemolytic anemia.Hematology following   Hyperkalemia/hyponatremia -Patient with multiple rapid electrolyte abnormalities radiations, this is most likely attributed to inaccurate readings in the presence of her severe hypertriglyceridemia. -Her electrolyte readings appear to be much more accurate with i-STAT Chem-8 -Renal consulted  Acute on chronic diastolic CHF Acute hypoxic respiratory failure -She did receive 1 unit of blood , will she is requiring IV fluid while she is on insulin drip, she is currently on IV diuresis 40 mg IV twice daily, has been held earlier today given worsening creatinine, but chest x-ray confirmed worsening pulmonary edema, and she is with oxygen requirement as well, so will resume back on IV Lasix, I have requested cardiology consult to assist with volume management as well.  Pulmonary hypertension - Continue Corlanor, sildenafil   AKI (likely in the setting  of DKA, UTI) on baseline CKD stage 4 E. Coli and enterococcus UTI - Avoid nephrotoxic agents as able - Ensure adequate renal perfusion -She was treated with Rocephin initially, then transition to Macrobid given sensitivities, but given questionable hemolytic anemia .   Acute liver injury, has chronic hepatosplenomegaly History of hepatic steatosis/fatty liver ?? multifactorial chylomicronemia syndrome - Trend LFTs   Hypothyroid TSH 5.284, Free T4 0.74.  - Continue levothyroxine   Deconditioning - PT consulted  Nontypical chest pain -Nontypical, pleuritic, EKG nonacute, encouraged use incentive spirometer, negative troponins  Hyperkalemia -Samples usually hemolyzed  DVT prophylaxis: Heparin stopped,  started on SCD Code Status: Full Family Communication: none at bedside Disposition:   Status is: Inpatient    Consultants:  PCCM Renal Hematology   Subjective:  Cardiac chest pain yesterday, has resolved, she still reports dyspnea today.  Objective: Vitals:   06/03/22 0900 06/03/22 0915 06/03/22 0930 06/03/22 1121  BP: 131/78     Pulse: 84 85 85   Resp: (!) 26 (!) 28 (!) 29   Temp:    97.9 F (36.6 C)  TempSrc:    Oral  SpO2: 94% 93% 93%   Weight:      Height:        Intake/Output Summary (Last 24 hours) at 06/03/2022 1127 Last data filed at 06/03/2022 1022 Gross per 24 hour  Intake 2602.85 ml  Output 1000 ml  Net 1602.85 ml   Filed Weights   06/01/22 0635 06/02/22 0500 06/03/22 0500  Weight: 67.2 kg 62.8 kg 69.6 kg    Examination:  Awake Alert, Oriented X 3, No new F.N deficits, Normal affect, right eye ptosis  Symmetrical Chest wall movement, bibasilar crackles RRR,No Gallops,Rubs or new Murmurs, No Parasternal Heave +ve B.Sounds, Abd Soft, No tenderness, No rebound - guarding or rigidity. No Cyanosis, Clubbing , trace  edema, No new Rash or bruise       Data Reviewed: I have personally reviewed following labs and imaging studies  CBC: Recent  Labs  Lab 05/29/22 0715 05/29/22 1400 06/01/22 0801 06/01/22 1510 06/01/22 1822 06/02/22 0208 06/02/22 1009 06/03/22 0339 06/03/22 0705 06/03/22 0811  WBC 12.0*   < > 6.6 6.6 5.9 6.8  --  9.0  --   --   NEUTROABS 10.4*  --   --   --  3.8  --   --   --   --   --   HGB 11.1*   < > 7.7* 6.7* 7.6* 8.3* 9.2* 7.2* 9.9* 8.5*  HCT 30.3*   < > 24.3* 21.8* 24.2* 24.4* 27.0* 24.0* 29.0* 25.0*  MCV 86.6   < > 92.4 90.5 90.0 87.5  --  89.6  --   --   PLT 206   < > 134* 120* 158 174  --  166  --   --    < > = values in this interval not displayed.    Basic Metabolic Panel: Recent Labs  Lab 05/30/22 1905 05/31/22 0155 06/01/22 1038 06/01/22 1548 06/02/22 0208 06/02/22 0620 06/02/22 1009 06/03/22 0339 06/03/22 0705 06/03/22 0811  NA 136 133* 130* 133* 128* 121* 129* 129* 137 133*  K 3.1* 4.4 7.1* 5.8* SPECIMEN HEMOLYZED. HEMOLYSIS MAY AFFECT INTEGRITY OF RESULTS. SPECIMEN HEMOLYZED. HEMOLYSIS MAY AFFECT INTEGRITY OF RESULTS. 4.1 SPECIMEN HEMOLYZED. HEMOLYSIS MAY AFFECT INTEGRITY OF RESULTS. 3.8 4.2  CL 108 103 98 97* 96* 92*  --  98  --  104  CO2 13* 20* 21* 22 19* 23  --  19*  --   --   GLUCOSE 199* 163* 242* 256* 167* 69*  --  127*  --  81  BUN '19 18 18 19 20 20  '$ --  21*  --  21*  CREATININE 1.22* 1.32* 1.59* 1.74* 1.63* 1.07*  --  1.90*  --  2.00*  CALCIUM 6.6* 7.8* 8.4* 8.3* 7.8* 7.5*  --  7.1*  --   --   MG 1.9 2.2 1.9  --  2.0  --   --  1.8  --   --   PHOS 3.1 3.6 2.7  --  2.3*  --   --  3.4  --   --     GFR: Estimated Creatinine Clearance: 32.8 mL/min (A) (by C-G formula based on SCr of 2 mg/dL (H)).  Liver Function Tests: Recent Labs  Lab 06/01/22 1038 06/01/22 1822 06/02/22 0208 06/02/22 1538 06/03/22 0339  AST 172* RESULTS UNAVAILABLE DUE TO INTERFERING SUBSTANCE SPECIMEN HEMOLYZED. HEMOLYSIS MAY AFFECT INTEGRITY OF RESULTS. 110* 142*  ALT 88* RESULTS UNAVAILABLE DUE TO INTERFERING SUBSTANCE SPECIMEN HEMOLYZED. HEMOLYSIS MAY AFFECT INTEGRITY OF RESULTS. 78* 76*   ALKPHOS 153* 154* 152* 176* 164*  BILITOT 3.0* 4.8* 5.1* 2.7* 3.9*  PROT >12.0* RESULTS UNAVAILABLE DUE TO INTERFERING SUBSTANCE SPECIMEN HEMOLYZED. HEMOLYSIS MAY AFFECT INTEGRITY OF RESULTS. 5.3* 5.5*  ALBUMIN 2.7* 2.7* 2.5* 2.4* 2.2*    CBG: Recent Labs  Lab 06/03/22 0506 06/03/22 0702 06/03/22 0924 06/03/22 1017 06/03/22 1106  GLUCAP 99 84 74 62* 78     Recent Results (from the past 240 hour(s))  Urine Culture     Status: Abnormal   Collection Time: 05/29/22  9:19 AM   Specimen: Urine, Random  Result Value Ref Range Status   Specimen Description   Final    URINE, RANDOM Performed at [facility], [address], [place], [place] [address]    Special Requests   Final    NONE Reflexed from [id number] Performed at [facility], [address], [place],  [address]    Culture (A)  Final    >=100,000 COLONIES/mL ESCHERICHIA COLI Confirmed Extended Spectrum Beta-Lactamase Producer (ESBL).  In bloodstream infections from ESBL organisms, carbapenems are preferred over piperacillin/tazobactam. They are shown to have a lower risk of mortality. 80,000 COLONIES/mL ENTEROCOCCUS FAECALIS    Report Status 05/31/2022 FINAL  Final   Organism ID, Bacteria ESCHERICHIA COLI (A)  Final   Organism ID, Bacteria ENTEROCOCCUS FAECALIS (A)  Final      Susceptibility   Escherichia coli - MIC*    AMPICILLIN >=32 RESISTANT Resistant     CEFAZOLIN >=64 RESISTANT Resistant     CEFEPIME 2 SENSITIVE Sensitive     CEFTRIAXONE >=64 RESISTANT Resistant     CIPROFLOXACIN >=4 RESISTANT Resistant     GENTAMICIN <=1 SENSITIVE Sensitive     IMIPENEM <=0.25 SENSITIVE Sensitive     NITROFURANTOIN <=16 SENSITIVE Sensitive     TRIMETH/SULFA >=320 RESISTANT Resistant     AMPICILLIN/SULBACTAM 16 INTERMEDIATE Intermediate     PIP/TAZO <=4 SENSITIVE Sensitive     * >=100,000 COLONIES/mL ESCHERICHIA COLI   Enterococcus faecalis - MIC*    AMPICILLIN <=2 SENSITIVE  Sensitive     NITROFURANTOIN <=16 SENSITIVE Sensitive     VANCOMYCIN 1 SENSITIVE Sensitive     * 80,000 COLONIES/mL ENTEROCOCCUS FAECALIS  MRSA Next Gen by PCR, Nasal     Status: None   Collection Time:  05/29/22  1:09 PM   Specimen: Nasal Mucosa; Nasal Swab  Result Value Ref Range Status   MRSA by PCR Next Gen NOT DETECTED NOT DETECTED Final    Comment: (NOTE) The GeneXpert MRSA Assay (FDA approved for NASAL specimens only), is one component of a comprehensive MRSA colonization surveillance program. It is not intended to diagnose MRSA infection nor to guide or monitor treatment for MRSA infections. Test performance is not FDA approved in patients less than 50 years old. Performed at [facility], [place] [address] [place], [place] [address]          Radiology Studies: DG Chest Port 1 View  Result Date: 06/03/2022 CLINICAL DATA:  Hypoxia. EXAM: PORTABLE CHEST 1 VIEW COMPARISON:  Chest x-ray dated June 01, 2022. FINDINGS: New right upper extremity PICC line with tip in the proximal right atrium. Unchanged cardiomegaly. Diffuse interstitial thickening and hazy airspace opacities throughout both lungs have mildly worsened in the interval. No pneumothorax or large pleural effusion. No acute osseous abnormality. IMPRESSION: 1. Mildly worsened pulmonary edema. 2. New right upper extremity PICC line with tip in the proximal right atrium. Electronically Signed   By: Titus Dubin M.D.   On: 06/03/2022 10:19   CT ABDOMEN PELVIS WO CONTRAST  Result Date: 06/01/2022 CLINICAL DATA:  anemia, rule out retroperitoneal bleed or hematoma EXAM: CT ABDOMEN AND PELVIS WITHOUT CONTRAST TECHNIQUE: Multidetector CT imaging of the abdomen and pelvis was performed following the standard protocol without IV contrast. RADIATION DOSE REDUCTION: This exam was performed according to the departmental dose-optimization program which includes automated exposure control, adjustment of the mA and/or kV according to  patient size and/or use of iterative reconstruction technique. COMPARISON:  Chest XR, concurrent. CT AP, 05/29/2022 and 03/30/2022. CT CAP, 05/12/2022. FINDINGS: Lower chest: Cardiomegaly with LEFT ventricular enlargement, greater than expected in a patient of this age. Hypodensity of the cardiac blood pool. Ground-glass opacities and interlobular septal thickening at the lung bases. Trace LEFT pleural effusion. Hepatobiliary: Hepatomegaly. Diffuse hypodensity of liver. No gallstones, gallbladder wall thickening, or biliary dilatation. Pancreas: No pancreatic ductal dilatation or surrounding inflammatory changes. Spleen: Splenomegaly.  No focal abnormality. Adrenals/Urinary Tract: Adrenal glands are unremarkable. Kidneys are normal, without renal calculi, focal lesion, or hydronephrosis. Mild distention of the urinary bladder. Stomach/Bowel: Stomach is within normal limits. Appendix is not definitively visualized. No evidence of bowel wall thickening, distention, or inflammatory changes. Vascular/Lymphatic: Mild burden of distal aortic atherosclerosis, though greater than expected in a patient of this age. No enlarged abdominal or pelvic lymph nodes. Reproductive: Uterus and adnexa are unremarkable. Other: Rotund abdomen. Mild body wall edema. Multifocal anterior abdominal wall subcutaneous contusions, likely injection sites. Similar appearance of subcutaneous tissue thickening at the RIGHT groin and months pubis. No abdominopelvic ascites. Musculoskeletal: No acute or significant osseous findings. IMPRESSION: 1. No acute abdominopelvic process. 2. Cardiomegaly with LEFT ventricular enlargement, greater than expected in a patient of this age. Hypodensity of the cardiac blood pool as can be seen with anemia. 3. Bibasilar ground-glass opacities and interlobular septal thickening, suspicious for developing pulmonary edema. 4. Hepatomegaly and steatosis. Additional incidental, chronic and senescent findings as above.  Electronically Signed   By: Michaelle Birks M.D.   On: 06/01/2022 19:09   [place] EKG SITE RITE  Result Date: 06/01/2022 If Site Rite image not attached, placement could not be confirmed due to current cardiac rhythm.  DG Chest Port 1 View  Result Date: 06/01/2022 CLINICAL DATA:  Shortness of breath. EXAM: PORTABLE CHEST  1 VIEW COMPARISON:  May 31, 2022. FINDINGS: Stable cardiomediastinal silhouette. Minimal bibasilar subsegmental atelectasis is noted. Bony thorax is unremarkable. IMPRESSION: Minimal bibasilar subsegmental atelectasis. Electronically Signed   By: Marijo Conception M.D.   On: 06/01/2022 13:34        Scheduled Meds:  sodium chloride   Intravenous Once   aspirin EC  81 mg Oral Daily   carvedilol  3.125 mg Oral BID WC   Chlorhexidine Gluconate Cloth  6 each Topical Daily   DULoxetine  30 mg Oral QHS   fenofibrate  160 mg Oral Daily   furosemide  40 mg Intravenous BID   ivabradine  7.5 mg Oral BID WC   levothyroxine  125 mcg Oral Q0600   lipase/protease/amylase  36,000 Units Oral TID   loratadine  10 mg Oral Daily   pantoprazole  40 mg Oral Daily   pregabalin  300 mg Oral BID   rosuvastatin  40 mg Oral Daily   sildenafil  20 mg Oral TID   sodium bicarbonate  650 mg Oral BID   sodium chloride flush  10-40 mL Intracatheter Q12H   Continuous Infusions:  dextrose 50 mL/hr at 06/03/22 1022   insulin 0.2 Units/kg/hr (06/03/22 0700)     LOS: 5 days      Phillips Climes, MD [facility]   To contact the attending provider between 7A-7P or the covering provider during after hours 7P-7A, please log into the web site [email or web address] and access using universal Ramirez-Perez password for that web site. If you do not have the password, please call the hospital operator.  06/03/2022, 11:27 AM

## 2022-06-03 NOTE — Progress Notes (Signed)
Physical Therapy Treatment Patient Details Name: Jennifer [name] MRN: [id number] DOB: November 21, 1990 Today's Date: 06/03/2022   History of Present Illness 32 yo female admitted 3/3 with abdominal pain and n/v. Pt with DKA and pancreatitis. PMH: T2DM, astrocytoma, lipoprotein deficiency, pancreatitis, HLD, pulmonary hypertension, CHF, fatty liver    PT Comments    Pt pleasant and reports increased WOB since this am. Pt able to tolerate ambulation with improved SPO2 on 3L with energy conservation and frequent standing rests as well as decreased speed. Pt educated for continued energy conservation and IS use, pt only achieving 500cc.     Recommendations for follow up therapy are one component of a multi-disciplinary discharge planning process, led by the attending physician.  Recommendations may be updated based on patient status, additional functional criteria and insurance authorization.  Follow Up Recommendations  No PT follow up     Assistance Recommended at Discharge Intermittent Supervision/Assistance  Patient can return home with the following Assist for transportation   Equipment Recommendations  None recommended by PT    Recommendations for Other Services       Precautions / Restrictions Precautions Precautions: Fall Precaution Comments: L drop foot from astrocytoma as a child has a brace but doesn't wear it. watch sats     Mobility  Bed Mobility Overal bed mobility: Modified Independent             General bed mobility comments: bed flat with use of rail    Transfers Overall transfer level: Modified independent                 General transfer comment: supervision for lines    Ambulation/Gait Ambulation/Gait assistance: Supervision Gait Distance (Feet): 400 Feet Assistive device: None Gait Pattern/deviations: Decreased stride length, Decreased dorsiflexion - left, Step-through pattern   Gait velocity interpretation: 1.31 - 2.62 ft/sec, indicative of  limited community ambulator   General Gait Details: pt with 5 standing rest breaks for energy conservation with pt able to maintain SPO2 90-93% on RA with cues for controlled speed. Pt with some veering to left with gait today which she reports is baseline   Marine scientist [place] (Stroke Patients Only)       Balance Overall balance assessment: Mild deficits observed, not formally tested                                          Cognition Arousal/Alertness: Awake/alert Behavior During Therapy: WFL for tasks assessed/performed Overall Cognitive Status: Within Functional Limits for tasks assessed                                          Exercises General Exercises - Lower Extremity Long Arc Quad: AROM, Both, Seated, 20 reps Hip Flexion/Marching: AROM, Both, Seated, 20 reps    General Comments        Pertinent Vitals/Pain Pain Assessment Pain Assessment: No/denies pain    Home Living                          Prior Function            PT Goals (current goals can now be found in the  care plan section) Progress towards PT goals: Progressing toward goals    Frequency    Min 3X/week      PT Plan Current plan remains appropriate    Co-evaluation              AM-PAC PT "6 Clicks" Mobility   Outcome Measure  Help needed turning from your back to your side while in a flat bed without using bedrails?: None Help needed moving from lying on your back to sitting on the side of a flat bed without using bedrails?: None Help needed moving to and from a bed to a chair (including a wheelchair)?: None Help needed standing up from a chair using your arms (e.g., wheelchair or bedside chair)?: A Little Help needed to walk in hospital room?: A Little Help needed climbing 3-5 steps with a railing? : A Little 6 Click Score: 21    End of Session Equipment Utilized During Treatment:  Oxygen Activity Tolerance: Patient tolerated treatment well Patient left: in chair;with call bell/phone within reach Nurse Communication: Mobility status PT Visit Diagnosis: Unsteadiness on feet (R26.81);Difficulty in walking, not elsewhere classified (R26.2)     Time: 1030-1057 PT Time Calculation (min) (ACUTE ONLY): 27 min  Charges:  $Gait Training: 8-22 mins $Therapeutic Activity: 8-22 mins                     Bayard Males, PT Acute Rehabilitation Services Office: [place] 06/03/2022, 12:07 PM

## 2022-06-03 NOTE — Inpatient Diabetes Management (Signed)
Inpatient Diabetes Program Recommendations  AACE/ADA: New Consensus Statement on Inpatient Glycemic Control (2015)  Target Ranges:  Prepandial:   less than 140 mg/dL      Peak postprandial:   less than 180 mg/dL (1-2 hours)      Critically ill patients:  140 - 180 mg/dL    Latest Reference Range & Units 06/02/22 23:06 06/03/22 00:58 06/03/22 01:01 06/03/22 01:24 06/03/22 01:42 06/03/22 01:44 06/03/22 02:07 06/03/22 02:56  Glucose-Capillary 70 - 99 mg/dL 101 (H) 45 (L) 72 65 (L) 73 83 91 110 (H)  (H): Data is abnormally high (L): Data is abnormally low   Admit with:  Diabetic ketoacidosis, type 2 diabetes with hyperglycemia Anion gap metabolic acidosis secondary to above Possible pancreatitis, hypertriglyceridemia Nausea vomit abdominal pain   History: DM, Pancreatitis, Astrocytoma, Lipoprotein deficiency    Home DM Meds: Humulin R U-500 Insulin 290 units TID with each meal                              Humulin R U-500 Insulin 100 units QHS   Current Orders: IV Insulin Drip for High TG (0.2 units/kg/hr)    MD- Note Hypoglycemia overnight.  Does pt need higher rate of D5NS IV Fluids?  Getting 75cc/hr right now     --Will follow patient during hospitalization--  Wyn Quaker RN, MSN, [place] Diabetes Coordinator Inpatient Glycemic Control Team Team Pager: [phone] (8a-5p)

## 2022-06-04 ENCOUNTER — Inpatient Hospital Stay (HOSPITAL_COMMUNITY): Payer: Medicaid Other

## 2022-06-04 DIAGNOSIS — E781 Pure hyperglyceridemia: Secondary | ICD-10-CM | POA: Diagnosis not present

## 2022-06-04 DIAGNOSIS — I5021 Acute systolic (congestive) heart failure: Secondary | ICD-10-CM

## 2022-06-04 DIAGNOSIS — I5022 Chronic systolic (congestive) heart failure: Secondary | ICD-10-CM | POA: Diagnosis not present

## 2022-06-04 LAB — GLUCOSE, CAPILLARY
Glucose-Capillary: 104 mg/dL — ABNORMAL HIGH (ref 70–99)
Glucose-Capillary: 116 mg/dL — ABNORMAL HIGH (ref 70–99)
Glucose-Capillary: 127 mg/dL — ABNORMAL HIGH (ref 70–99)
Glucose-Capillary: 139 mg/dL — ABNORMAL HIGH (ref 70–99)
Glucose-Capillary: 140 mg/dL — ABNORMAL HIGH (ref 70–99)
Glucose-Capillary: 147 mg/dL — ABNORMAL HIGH (ref 70–99)
Glucose-Capillary: 150 mg/dL — ABNORMAL HIGH (ref 70–99)
Glucose-Capillary: 62 mg/dL — ABNORMAL LOW (ref 70–99)
Glucose-Capillary: 64 mg/dL — ABNORMAL LOW (ref 70–99)
Glucose-Capillary: 68 mg/dL — ABNORMAL LOW (ref 70–99)
Glucose-Capillary: 71 mg/dL (ref 70–99)
Glucose-Capillary: 73 mg/dL (ref 70–99)
Glucose-Capillary: 76 mg/dL (ref 70–99)
Glucose-Capillary: 80 mg/dL (ref 70–99)
Glucose-Capillary: 90 mg/dL (ref 70–99)
Glucose-Capillary: 94 mg/dL (ref 70–99)

## 2022-06-04 LAB — COOXEMETRY PANEL
Carboxyhemoglobin: 1.4 % (ref 0.5–1.5)
Carboxyhemoglobin: 0.6 % (ref 0.5–1.5)
Methemoglobin: 2.1 % — ABNORMAL HIGH (ref 0.0–1.5)
Methemoglobin: <0.7 % (ref 0.0–1.5)
O2 Saturation: 40.8 %
O2 Saturation: 52.8 %
Total hemoglobin: <6.9 g/dL — CL (ref 12.0–16.0)
Total hemoglobin: 7.5 g/dL — ABNORMAL LOW (ref 12.0–16.0)

## 2022-06-04 LAB — CBC
HCT: 21.5 % — ABNORMAL LOW (ref 36.0–46.0)
Hemoglobin: 7.7 g/dL — ABNORMAL LOW (ref 12.0–15.0)
MCH: 31.3 pg (ref 26.0–34.0)
MCHC: 35.8 g/dL (ref 30.0–36.0)
MCV: 87.4 fL (ref 80.0–100.0)
Platelets: 100 10*3/uL — ABNORMAL LOW (ref 150–400)
RBC: 2.46 MIL/uL — ABNORMAL LOW (ref 3.87–5.11)
RDW: 17.3 % — ABNORMAL HIGH (ref 11.5–15.5)
WBC: 8.7 10*3/uL (ref 4.0–10.5)
nRBC: 0.3 % — ABNORMAL HIGH (ref 0.0–0.2)

## 2022-06-04 LAB — POCT I-STAT, CHEM 8
BUN: 25 mg/dL — ABNORMAL HIGH (ref 6–20)
Calcium, Ion: 1.02 mmol/L — ABNORMAL LOW (ref 1.15–1.40)
Chloride: 99 mmol/L (ref 98–111)
Creatinine, Ser: 2.8 mg/dL — ABNORMAL HIGH (ref 0.44–1.00)
Glucose, Bld: 65 mg/dL — ABNORMAL LOW (ref 70–99)
HCT: 24 % — ABNORMAL LOW (ref 36.0–46.0)
Hemoglobin: 8.2 g/dL — ABNORMAL LOW (ref 12.0–15.0)
Potassium: 4.2 mmol/L (ref 3.5–5.1)
Sodium: 131 mmol/L — ABNORMAL LOW (ref 135–145)
TCO2: 23 mmol/L (ref 22–32)

## 2022-06-04 LAB — COMPREHENSIVE METABOLIC PANEL
ALT: 123 U/L — ABNORMAL HIGH (ref 0–44)
AST: 290 U/L — ABNORMAL HIGH (ref 15–41)
Albumin: 2.1 g/dL — ABNORMAL LOW (ref 3.5–5.0)
Alkaline Phosphatase: 167 U/L — ABNORMAL HIGH (ref 38–126)
Anion gap: 12 (ref 5–15)
BUN: 23 mg/dL — ABNORMAL HIGH (ref 6–20)
CO2: 20 mmol/L — ABNORMAL LOW (ref 22–32)
Calcium: 7.1 mg/dL — ABNORMAL LOW (ref 8.9–10.3)
Chloride: 97 mmol/L — ABNORMAL LOW (ref 98–111)
Creatinine, Ser: 2.41 mg/dL — ABNORMAL HIGH (ref 0.44–1.00)
GFR, Estimated: 27 mL/min — ABNORMAL LOW (ref >60)
Glucose, Bld: 78 mg/dL (ref 70–99)
Potassium: 4.1 mmol/L (ref 3.5–5.1)
Sodium: 129 mmol/L — ABNORMAL LOW (ref 135–145)
Total Bilirubin: 2.5 mg/dL — ABNORMAL HIGH (ref 0.3–1.2)
Total Protein: 5.6 g/dL — ABNORMAL LOW (ref 6.5–8.1)

## 2022-06-04 LAB — BASIC METABOLIC PANEL
Anion gap: 18 — ABNORMAL HIGH (ref 5–15)
BUN: 23 mg/dL — ABNORMAL HIGH (ref 6–20)
CO2: 20 mmol/L — ABNORMAL LOW (ref 22–32)
Calcium: 7.3 mg/dL — ABNORMAL LOW (ref 8.9–10.3)
Chloride: 89 mmol/L — ABNORMAL LOW (ref 98–111)
Creatinine, Ser: 2.36 mg/dL — ABNORMAL HIGH (ref 0.44–1.00)
GFR, Estimated: 28 mL/min — ABNORMAL LOW (ref >60)
Glucose, Bld: 140 mg/dL — ABNORMAL HIGH (ref 70–99)
Potassium: 4.1 mmol/L (ref 3.5–5.1)
Sodium: 127 mmol/L — ABNORMAL LOW (ref 135–145)

## 2022-06-04 LAB — LACTIC ACID, PLASMA
Lactic Acid, Venous: 1.2 mmol/L (ref 0.5–1.9)
Lactic Acid, Venous: 1.4 mmol/L (ref 0.5–1.9)

## 2022-06-04 LAB — ECHOCARDIOGRAM COMPLETE
Area-P 1/2: 4.77 cm2
Height: 57 in
S' Lateral: 3.05 cm
Single Plane A4C EF: 36.9 %
Weight: 2536.17 oz

## 2022-06-04 LAB — HEMOGLOBIN A1C
Hgb A1c MFr Bld: 11.1 % — ABNORMAL HIGH (ref 4.8–5.6)
Mean Plasma Glucose: 272 mg/dL

## 2022-06-04 LAB — TRIGLYCERIDES: Triglycerides: 1894 mg/dL — ABNORMAL HIGH (ref <150)

## 2022-06-04 LAB — BRAIN NATRIURETIC PEPTIDE: B Natriuretic Peptide: 550.1 pg/mL — ABNORMAL HIGH (ref 0.0–100.0)

## 2022-06-04 MED ORDER — PERFLUTREN LIPID MICROSPHERE
1.0000 mL | INTRAVENOUS | Status: AC | PRN
  Administered 2022-06-04: 4 mL via INTRAVENOUS

## 2022-06-04 MED ORDER — FUROSEMIDE 10 MG/ML IJ SOLN: 80.0000 mg | Freq: Two times a day (BID) | INTRAMUSCULAR | Status: DC

## 2022-06-04 MED ORDER — MILRINONE LACTATE IN DEXTROSE 20-5 MG/100ML-% IV SOLN
0.2500 ug/kg/min | INTRAVENOUS | Status: DC
  Administered 2022-06-05 – 2022-06-07 (×4): 0.25 ug/kg/min via INTRAVENOUS
  Filled 2022-06-04 (×4): qty 100

## 2022-06-04 MED ORDER — FUROSEMIDE 10 MG/ML IJ SOLN
80.0000 mg | Freq: Once | INTRAMUSCULAR | Status: AC
  Administered 2022-06-04: 80 mg via INTRAVENOUS
  Filled 2022-06-04: qty 8

## 2022-06-04 MED ORDER — HYDROMORPHONE HCL 1 MG/ML IJ SOLN
0.5000 mg | INTRAMUSCULAR | Status: DC | PRN
  Administered 2022-06-04 – 2022-06-08 (×21): 0.5 mg via INTRAVENOUS
  Filled 2022-06-04 (×21): qty 0.5

## 2022-06-04 MED ORDER — MILRINONE LACTATE IN DEXTROSE 20-5 MG/100ML-% IV SOLN
INTRAVENOUS | Status: AC
  Administered 2022-06-04: 0.25 ug/kg/min via INTRAVENOUS
  Filled 2022-06-04: qty 100

## 2022-06-04 NOTE — Progress Notes (Signed)
PROGRESS NOTE    Jennifer [name]  [id number] DOB: 08-Aug-1990 DOA: 05/29/2022 PCP: Sue Lush, PA-C    Chief Complaint  Patient presents with   Abdominal Pain    Brief Narrative:   32 year old woman who presented to [facility] ED with abdominal pain, nausea and vomiting. PMHx significant for HTN, HLD, CHF (Echo 11/2021 EF 50-55%, no RWMAs, mildly elevated PASP), pulmonary HTN (s/p CardioMEMS implantation, on Corlanor and sildenafil), T2DM (followed by [facility]), NAFLD, hypothyroidism, astrocytoma, PCOS.   On presentation to ED, patient was found to have an anion gap with acidosis consistent with DKA/hyperglycemia. Was also found to have a TG > 5000.  Concern for ongoing pancreatitis.  CT A/P 3/3 without acute process, no e/o pancreatitis; +hepatosplenomegaly. -Patient's anion gap has closed, she was transitioned off insulin drip, but her workup was noted for evolving anemia, suspicious for hemolytic anemia with elevated LFTs, and LDH, as well patient with significant difficulty with lab accuracy likely in the setting of her hypertriglyceridemia.    Assessment & Plan:   Principal Problem:   Hypertriglyceridemia Active Problems:   Acute renal failure superimposed on stage 3a chronic kidney disease (HCC)   History of astrocytoma of brain   Type 2 diabetes mellitus with hyperlipidemia (HCC)   Hypothyroidism   Chronic systolic CHF (congestive heart failure) (HCC)   Essential hypertension   Nonischemic cardiomyopathy (HCC)   Elevated liver enzymes   Pulmonary hypertension (HCC)   NASH (nonalcoholic steatohepatitis)   UTI (urinary tract infection) due to Enterococcus   E. coli UTI (urinary tract infection)   DKA (diabetic ketoacidosis) (HCC)  Diabetic ketoacidosis, type 2 diabetes with hyperglycemia AGMA secondary to above -He is on insulin U-500 at home, reports she has been compliant, reports she ran out of Dexcom refills, so she was not checking her CBGs.. -Insulin  GTT DKA protocol has been discontinued after anion gap closed.  On 3/5. -Currently she is back on insulin drip for hypertriglyceridemia protocol.  Triglyceride is improving, will decrease insulin drip to 0.1 unit per cake per hour, will go ahead and decrease her D10W which should assist with volume.   hypertriglyceridemia Nausea, vomiting, abdominal pain History of chronic pancreatitis ?? multifactorial chylomicronemia syndrome -With known history of pancreatitis in the past due to a familial hypertriglyceridemia -[name] on home Repatha -Continue fenofibrate and statin. -started  on insulin gtt. for triglyceride> 5000, glyceride continues to improve, it is around 1800 today, will decrease her drip right. -Continue with Creon -Lipase within normal limit, no epigastric pain, no evidence of pancreatitis on imaging.  Anemia  - hemoglobin has been gradually dropping, quired 1 unit for hemoglobin 6.7, hemoglobin has been stable since.   -CT abdomen pelvis obtained, no evidence of retroperitoneal hematoma -Total bili, direct bili, LDH are elevated, concern for hemolytic anemia.Hematology following   Hyperkalemia/hyponatremia -Significant electrolyte variation, due to inaccurate reading from her severe hypertriglyceridemia. -I-STAT as needed for more accurate labs, but not triglyceride much improved so hoping for more accurate readings  Acute on chronic diastolic/systolic CHF Anasarca Concern for cardiogenic shock Acute hypoxic respiratory failure -cardiology  assistance greatly appreciated, started on milrinone drip. -Diuresis per cardiology, currently on hold due to AKI  Pulmonary hypertension - Continue Corlanor,  - sildenafil hold per cardiology   AKI (likely in the setting of DKA, UTI) on baseline CKD stage 4 - Avoid nephrotoxic agents as able - Ensure adequate renal perfusion - creatinine rising up, 2.8 today, likely cardiorenal syndrome  E. Coli and enterococcus  UTI -She was  treated with Rocephin initially, then transition to [place] given sensitivities, it was discontinued given questionable hemolytic anemia .   Acute liver injury, has chronic hepatosplenomegaly History of hepatic steatosis/fatty liver ?? multifactorial chylomicronemia syndrome - Trend LFTs   Hypothyroid TSH 5.284, Free T4 0.74.  - Continue levothyroxine   Deconditioning - PT consulted  Nontypical chest pain -Nontypical, pleuritic, EKG nonacute, encouraged use incentive spirometer, negative troponins  Hyperkalemia -Samples usually hemolyzed  DVT prophylaxis: Heparin stopped,  started on SCD Code Status: Full Family Communication: none at bedside Disposition:   Status is: Inpatient    Consultants:  PCCM Renal Hematology   Subjective:  Denies any chest pain, fever or chills, she had an retention overnight with 450 cc requiring in and out x 1.  Objective: Vitals:   06/04/22 0430 06/04/22 0500 06/04/22 0600 06/04/22 0700  BP:  (!) 95/58 (!) 96/48 105/78  Pulse: 68 69 68 65  Resp: 19 19 (!) 21 20  Temp:      TempSrc:      SpO2: 95% 90% 95% 97%  Weight:  71.9 kg    Height:        Intake/Output Summary (Last 24 hours) at 06/04/2022 1037 Last data filed at 06/04/2022 0700 Gross per 24 hour  Intake 1901.2 ml  Output 1600 ml  Net 301.2 ml   Filed Weights   06/02/22 0500 06/03/22 0500 06/04/22 0500  Weight: 62.8 kg 69.6 kg 71.9 kg    Examination:   Awake Alert, Oriented X 3, No new F.N deficits, Normal affect, right eye ptosis at baseline Symmetrical Chest wall movement, Good air movement bilaterally RRR,No Gallops,Rubs   Murmurs, No Parasternal Heave, positive JVD +ve B.Sounds, Abd mildy distended, No tenderness, No rebound - guarding or rigidity. No Cyanosis, Clubbing , trace edema      Data Reviewed: I have personally reviewed following labs and imaging studies  CBC: Recent Labs  Lab 05/29/22 0715 05/29/22 1400 06/01/22 1510 06/01/22 1822  06/02/22 0208 06/02/22 1009 06/03/22 0339 06/03/22 0705 06/03/22 0811 06/04/22 0400 06/04/22 0838  WBC 12.0*   < > 6.6 5.9 6.8  --  9.0  --   --  8.7  --   NEUTROABS 10.4*  --   --  3.8  --   --   --   --   --   --   --   HGB 11.1*   < > 6.7* 7.6* 8.3*   < > 7.2* 9.9* 8.5* 7.7* 8.2*  HCT 30.3*   < > 21.8* 24.2* 24.4*   < > 24.0* 29.0* 25.0* 21.5* 24.0*  MCV 86.6   < > 90.5 90.0 87.5  --  89.6  --   --  87.4  --   PLT 206   < > 120* 158 174  --  166  --   --  100*  --    < > = values in this interval not displayed.    Basic Metabolic Panel: Recent Labs  Lab 05/30/22 1905 05/31/22 0155 06/01/22 1038 06/01/22 1548 06/02/22 0208 06/02/22 0620 06/02/22 1009 06/03/22 0339 06/03/22 0705 06/03/22 0811 06/04/22 0400 06/04/22 0838  NA 136 133* 130* 133* 128* 121*   < > 129* 137 133* 129* 131*  K 3.1* 4.4 7.1* 5.8* SPECIMEN HEMOLYZED. HEMOLYSIS MAY AFFECT INTEGRITY OF RESULTS. SPECIMEN HEMOLYZED. HEMOLYSIS MAY AFFECT INTEGRITY OF RESULTS.   < > SPECIMEN HEMOLYZED. HEMOLYSIS MAY AFFECT INTEGRITY OF RESULTS. 3.8 4.2 4.1 4.2  CL  108 103 98 97* 96* 92*  --  98  --  104 97* 99  CO2 13* 20* 21* 22 19* 23  --  19*  --   --  20*  --   GLUCOSE 199* 163* 242* 256* 167* 69*  --  127*  --  81 78 65*  BUN '19 18 18 19 20 20  '$ --  21*  --  21* 23* 25*  CREATININE 1.22* 1.32* 1.59* 1.74* 1.63* 1.07*  --  1.90*  --  2.00* 2.41* 2.80*  CALCIUM 6.6* 7.8* 8.4* 8.3* 7.8* 7.5*  --  7.1*  --   --  7.1*  --   MG 1.9 2.2 1.9  --  2.0  --   --  1.8  --   --   --   --   PHOS 3.1 3.6 2.7  --  2.3*  --   --  3.4  --   --   --   --    < > = values in this interval not displayed.    GFR: Estimated Creatinine Clearance: 23.9 mL/min (A) (by C-G formula based on SCr of 2.8 mg/dL (H)).  Liver Function Tests: Recent Labs  Lab 06/01/22 1822 06/02/22 0208 06/02/22 1538 06/03/22 0339 06/04/22 0400  AST RESULTS UNAVAILABLE DUE TO INTERFERING SUBSTANCE SPECIMEN HEMOLYZED. HEMOLYSIS MAY AFFECT INTEGRITY OF  RESULTS. 110* 142* 290*  ALT RESULTS UNAVAILABLE DUE TO INTERFERING SUBSTANCE SPECIMEN HEMOLYZED. HEMOLYSIS MAY AFFECT INTEGRITY OF RESULTS. 78* 76* 123*  ALKPHOS 154* 152* 176* 164* 167*  BILITOT 4.8* 5.1* 2.7* 3.9* 2.5*  PROT RESULTS UNAVAILABLE DUE TO INTERFERING SUBSTANCE SPECIMEN HEMOLYZED. HEMOLYSIS MAY AFFECT INTEGRITY OF RESULTS. 5.3* 5.5* 5.6*  ALBUMIN 2.7* 2.5* 2.4* 2.2* 2.1*    CBG: Recent Labs  Lab 06/04/22 0201 06/04/22 0405 06/04/22 0539 06/04/22 0837 06/04/22 0939  GLUCAP 94 76 80 62* 73     Recent Results (from the past 240 hour(s))  Urine Culture     Status: Abnormal   Collection Time: 05/29/22  9:19 AM   Specimen: Urine, Random  Result Value Ref Range Status   Specimen Description   Final    URINE, RANDOM Performed at [facility], [address] [place], [place] [address]    Special Requests   Final    NONE Reflexed from [id number] Performed at [facility], [address], [place], [place] [address]    Culture (A)  Final    >=100,000 COLONIES/mL ESCHERICHIA COLI Confirmed Extended Spectrum Beta-Lactamase Producer (ESBL).  In bloodstream infections from ESBL organisms, carbapenems are preferred over piperacillin/tazobactam. They are shown to have a lower risk of mortality. 80,000 COLONIES/mL ENTEROCOCCUS FAECALIS    Report Status 05/31/2022 FINAL  Final   Organism ID, Bacteria ESCHERICHIA COLI (A)  Final   Organism ID, Bacteria ENTEROCOCCUS FAECALIS (A)  Final      Susceptibility   Escherichia coli - MIC*    AMPICILLIN >=32 RESISTANT Resistant     CEFAZOLIN >=64 RESISTANT Resistant     CEFEPIME 2 SENSITIVE Sensitive     CEFTRIAXONE >=64 RESISTANT Resistant     CIPROFLOXACIN >=4 RESISTANT Resistant     GENTAMICIN <=1 SENSITIVE Sensitive     IMIPENEM <=0.25 SENSITIVE Sensitive     NITROFURANTOIN <=16 SENSITIVE Sensitive     TRIMETH/SULFA >=320 RESISTANT Resistant     AMPICILLIN/SULBACTAM 16 INTERMEDIATE  Intermediate     PIP/TAZO <=4 SENSITIVE Sensitive     * >=100,000 COLONIES/mL ESCHERICHIA COLI   Enterococcus  faecalis - MIC*    AMPICILLIN <=2 SENSITIVE Sensitive     NITROFURANTOIN <=16 SENSITIVE Sensitive     VANCOMYCIN 1 SENSITIVE Sensitive     * 80,000 COLONIES/mL ENTEROCOCCUS FAECALIS  MRSA Next Gen by PCR, Nasal     Status: None   Collection Time: 05/29/22  1:09 PM   Specimen: Nasal Mucosa; Nasal Swab  Result Value Ref Range Status   MRSA by PCR Next Gen NOT DETECTED NOT DETECTED Final    Comment: (NOTE) The GeneXpert MRSA Assay (FDA approved for NASAL specimens only), is one component of a comprehensive MRSA colonization surveillance program. It is not intended to diagnose MRSA infection nor to guide or monitor treatment for MRSA infections. Test performance is not FDA approved in patients less than 63 years old. Performed at [facility], [place] [address], [place], [place] [address]          Radiology Studies: DG Chest Port 1 View  Result Date: 06/03/2022 CLINICAL DATA:  Hypoxia. EXAM: PORTABLE CHEST 1 VIEW COMPARISON:  Chest x-ray dated June 01, 2022. FINDINGS: New right upper extremity PICC line with tip in the proximal right atrium. Unchanged cardiomegaly. Diffuse interstitial thickening and hazy airspace opacities throughout both lungs have mildly worsened in the interval. No pneumothorax or large pleural effusion. No acute osseous abnormality. IMPRESSION: 1. Mildly worsened pulmonary edema. 2. New right upper extremity PICC line with tip in the proximal right atrium. Electronically Signed   By: Titus Dubin M.D.   On: 06/03/2022 10:19        Scheduled Meds:  sodium chloride   Intravenous Once   aspirin EC  81 mg Oral Daily   Chlorhexidine Gluconate Cloth  6 each Topical Daily   DULoxetine  30 mg Oral QHS   fenofibrate  160 mg Oral Daily   levothyroxine  125 mcg Oral Q0600   lipase/protease/amylase  36,000 Units Oral TID   loratadine  10 mg Oral Daily    pantoprazole  40 mg Oral Daily   pregabalin  300 mg Oral BID   rosuvastatin  40 mg Oral Daily   sodium bicarbonate  650 mg Oral BID   sodium chloride flush  10-40 mL Intracatheter Q12H   Continuous Infusions:  dextrose 70 mL/hr at 06/04/22 0954   insulin 0.2 Units/kg/hr (06/04/22 0700)   milrinone 0.25 mcg/kg/min (06/04/22 0958)     LOS: 6 days      Phillips Climes, MD [facility]   To contact the attending provider between 7A-7P or the covering provider during after hours 7P-7A, please log into the web site [email or web address] and access using universal [place] password for that web site. If you do not have the password, please call the hospital operator.  06/04/2022, 10:37 AM

## 2022-06-04 NOTE — Progress Notes (Addendum)
Cardiology Progress Note  Patient ID: Jennifer [name] MRN: [id number] DOB: 1990-08-09 Date of Encounter: 06/04/2022  Primary Cardiologist: None  Subjective   Chief Complaint: None.   HPI: CVP 25.  Still volume up.  Creatinine rising.  Repeat echo.  Hemodynamically stable.  Triglycerides finally coming down.  ROS:  All other ROS reviewed and negative. Pertinent positives noted in the HPI.     Inpatient Medications  Scheduled Meds:  sodium chloride   Intravenous Once   aspirin EC  81 mg Oral Daily   carvedilol  3.125 mg Oral BID WC   Chlorhexidine Gluconate Cloth  6 each Topical Daily   DULoxetine  30 mg Oral QHS   fenofibrate  160 mg Oral Daily   furosemide  80 mg Intravenous BID   ivabradine  7.5 mg Oral BID WC   levothyroxine  125 mcg Oral Q0600   lipase/protease/amylase  36,000 Units Oral TID   loratadine  10 mg Oral Daily   pantoprazole  40 mg Oral Daily   pregabalin  300 mg Oral BID   rosuvastatin  40 mg Oral Daily   sildenafil  20 mg Oral TID   sodium bicarbonate  650 mg Oral BID   sodium chloride flush  10-40 mL Intracatheter Q12H   Continuous Infusions:  dextrose 50 mL/hr at 06/04/22 0804   insulin 0.2 Units/kg/hr (06/04/22 0700)   PRN Meds: acetaminophen, albuterol, dextrose, docusate sodium, guaiFENesin-dextromethorphan, ondansetron (ZOFRAN) IV, mouth rinse, polyethylene glycol, prochlorperazine, sodium chloride flush   Vital Signs   Vitals:   06/04/22 0430 06/04/22 0500 06/04/22 0600 06/04/22 0700  BP:  (!) 95/58 (!) 96/48 105/78  Pulse: 68 69 68 65  Resp: 19 19 (!) 21 20  Temp:      TempSrc:      SpO2: 95% 90% 95% 97%  Weight:  71.9 kg    Height:        Intake/Output Summary (Last 24 hours) at 06/04/2022 0932 Last data filed at 06/04/2022 0700 Gross per 24 hour  Intake 2251.2 ml  Output 2100 ml  Net 151.2 ml      06/04/2022    5:00 AM 06/03/2022    5:00 AM 06/02/2022    5:00 AM  Last 3 Weights  Weight (lbs) 158 lb 8.2 oz 153 lb 7 oz 138 lb 7.2  oz  Weight (kg) 71.9 kg 69.6 kg 62.8 kg      Telemetry  Overnight telemetry shows sinus rhythm in the 60s, which I personally reviewed.   ECG  The most recent ECG shows sinus rhythm heart rate 75, no acute ischemic changes or evidence of infarction, which I personally reviewed.   Physical Exam   Vitals:   06/04/22 0430 06/04/22 0500 06/04/22 0600 06/04/22 0700  BP:  (!) 95/58 (!) 96/48 105/78  Pulse: 68 69 68 65  Resp: 19 19 (!) 21 20  Temp:      TempSrc:      SpO2: 95% 90% 95% 97%  Weight:  71.9 kg    Height:        Intake/Output Summary (Last 24 hours) at 06/04/2022 0932 Last data filed at 06/04/2022 0700 Gross per 24 hour  Intake 2251.2 ml  Output 2100 ml  Net 151.2 ml       06/04/2022    5:00 AM 06/03/2022    5:00 AM 06/02/2022    5:00 AM  Last 3 Weights  Weight (lbs) 158 lb 8.2 oz 153 lb 7 oz 138 lb 7.2 oz  Weight (kg) 71.9 kg 69.6 kg 62.8 kg    Body mass index is 34.3 kg/m.   General: Ill-appearing Head: Atraumatic, normal size  Eyes: PEERLA, EOMI  Neck: Supple, JVD 15 to 18 cm of water Endocrine: No thryomegaly Cardiac: Normal S1, S2; RRR; no murmurs, rubs, or gallops Lungs: Clear to auscultation bilaterally, no wheezing, rhonchi or rales  Abd: Distended abdomen Ext: Trace edema Musculoskeletal: No deformities, BUE and BLE strength normal and equal Skin: Warm and dry, no rashes   Neuro: Alert and oriented to person, place, time, and situation, CNII-XII grossly intact, no focal deficits  Psych: Normal mood and affect   Labs  High Sensitivity Troponin:   Recent Labs  Lab 06/01/22 1510 06/01/22 1822  TROPONINIHS 16 14     Cardiac EnzymesNo results for input(s): "TROPONINI" in the last 168 hours. No results for input(s): "TROPIPOC" in the last 168 hours.  Chemistry Recent Labs  Lab 06/02/22 0620 06/02/22 1009 06/02/22 1538 06/03/22 0339 06/03/22 0705 06/03/22 0811 06/04/22 0400 06/04/22 0838  NA 121*   < >  --  129*   < > 133* 129* 131*  K SPECIMEN  HEMOLYZED. HEMOLYSIS MAY AFFECT INTEGRITY OF RESULTS.   < >  --  SPECIMEN HEMOLYZED. HEMOLYSIS MAY AFFECT INTEGRITY OF RESULTS.   < > 4.2 4.1 4.2  CL 92*  --   --  98  --  104 97* 99  CO2 23  --   --  19*  --   --  20*  --   GLUCOSE 69*  --   --  127*  --  81 78 65*  BUN 20  --   --  21*  --  21* 23* 25*  CREATININE 1.07*  --   --  1.90*  --  2.00* 2.41* 2.80*  CALCIUM 7.5*  --   --  7.1*  --   --  7.1*  --   PROT  --   --  5.3* 5.5*  --   --  5.6*  --   ALBUMIN  --   --  2.4* 2.2*  --   --  2.1*  --   AST  --   --  110* 142*  --   --  290*  --   ALT  --   --  78* 76*  --   --  123*  --   ALKPHOS  --   --  176* 164*  --   --  167*  --   BILITOT  --   --  2.7* 3.9*  --   --  2.5*  --   GFRNONAA >60  --   --  36*  --   --  27*  --   ANIONGAP 6  --   --  12  --   --  12  --    < > = values in this interval not displayed.    Hematology Recent Labs  Lab 06/02/22 0208 06/02/22 1009 06/02/22 1538 06/03/22 0339 06/03/22 0705 06/03/22 0811 06/04/22 0400 06/04/22 0838  WBC 6.8  --   --  9.0  --   --  8.7  --   RBC 2.79*  --  2.71* 2.68*  --   --  2.46*  --   HGB 8.3*   < >  --  7.2*   < > 8.5* 7.7* 8.2*  HCT 24.4*   < >  --  24.0*   < > 25.0* 21.5*  24.0*  MCV 87.5  --   --  89.6  --   --  87.4  --   MCH 29.7  --   --  26.9  --   --  31.3  --   MCHC 34.0  --   --  30.0  --   --  35.8  --   RDW 17.3*  --   --  19.5*  --   --  17.3*  --   PLT 174  --   --  166  --   --  100*  --    < > = values in this interval not displayed.   BNP Recent Labs  Lab 06/04/22 0400  BNP 550.1*    DDimer No results for input(s): "DDIMER" in the last 168 hours.   Radiology  DG Chest Port 1 View  Result Date: 06/03/2022 CLINICAL DATA:  Hypoxia. EXAM: PORTABLE CHEST 1 VIEW COMPARISON:  Chest x-ray dated June 01, 2022. FINDINGS: New right upper extremity PICC line with tip in the proximal right atrium. Unchanged cardiomegaly. Diffuse interstitial thickening and hazy airspace opacities throughout both  lungs have mildly worsened in the interval. No pneumothorax or large pleural effusion. No acute osseous abnormality. IMPRESSION: 1. Mildly worsened pulmonary edema. 2. New right upper extremity PICC line with tip in the proximal right atrium. Electronically Signed   By: Titus Dubin M.D.   On: 06/03/2022 10:19    Cardiac Studies  TTE 12/03/2021  1. Low normal to mild global reduction in LV systolic function; EF 50.   2. Left ventricular ejection fraction, by estimation, is 50 to 55%. The  left ventricle has low normal function. The left ventricle has no regional  wall motion abnormalities. Left ventricular diastolic parameters were  normal.   3. Right ventricular systolic function is normal. The right ventricular  size is normal. There is mildly elevated pulmonary artery systolic  pressure.   4. The mitral valve is normal in structure. Mild mitral valve  regurgitation. No evidence of mitral stenosis.   5. Tricuspid valve regurgitation is mild to moderate.   6. The aortic valve is tricuspid. Aortic valve regurgitation is mild. No  aortic stenosis is present.   7. The inferior vena cava is normal in size with greater than 50%  respiratory variability, suggesting right atrial pressure of 3 mmHg.   RHC/LHC 12/03/2020 Right and left heart catheterization 12/03/2020: Normal right heart catheterization pressures.  RA 4/1, mean 1; RV 23/4, EDP 6; PA 22/8, mean 11 and PA saturation 76%.  PW 9/7, mean 7 mmHg. CO 5.6, CI 3.88.  QP/QS 1.00. LV: 136/9, EDP 17 mmHg.  Ao 131/86, mean 105 mmHg.  No pressure gradient across the aortic valve. RCA: A very small but dominant RCA which is diffusely diseased around 80%.  Mid segment 99% occlusion.  Conus branch collateralizes distal RCA. LM: Large, smooth and normal. LAD: Large vessel, minimal diffuse disease.  Small to moderate-sized D1 and D2. CX: Large vessel.  Minimal diffuse disease.  Gives origin to large OM1 and OM 2.   Impression: Nonischemic  cardiomyopathy, single vessel occlusion does not explain her markedly reduced LVEF.  Medical therapy for CAD and nonischemic cardiomyopathy.  I have started her on carvedilol 3.125 mg twice daily.  Patient Profile  Jennifer [name] is a 32 y.o. female with nonischemic systolic heart failure with recovered ejection fraction, severe hypertriglyceridemia, CAD, chronic pancreatitis, obesity who was admitted on 05/29/2022 for DKA and pancreatitis due to hypertriglyceridemia.  Cardiology was consulted for volume overload.  Assessment & Plan   # Acute on chronic systolic heart failure, recovered ejection fraction, 50% # Anasarca # Volume overload with concerns for cardiogenic shock  -Currently admitted with hypertriglyceridemia.  Also was in DKA.  She has +8.2 L since admission.  [name] grossly volume overloaded.  CVP 22 mmHg.   -Creatinine is rising despite IV diuresis.  Co. ox 51->40.  Check lactic acid.  We will start milrinone 0.25 mcg/kg/min.  I think it is okay to pursue this.  I do not believe she needs to be on dobutamine yet.  We will obtain a stat echo.  If we run into issues with milrinone we can pursue Levophed or dobutamine.  Again I think we will better be able to titrate therapies based on her repeat echocardiogram. -Stop AV nodal agents.  Hold sildenafil for now.  She will be on milrinone.  Plan to restart Lasix likely later today once Co. ox is improved.  #AKI #Acute Liver Failure -Suspect secondary to cardiogenic shock.  Repeat echo.  Starting milrinone as above. -Hopefully we can improve her kidney function and get her volume status improved.  # Hypertriglyceridemia # Pancreatitis -Seems to be improving.  Supportive care per hospital medicine  # Anemia -Elevated LDH likely from liver disease.  Haptoglobin value is within limits.  Likely not hemolytic per hematology assessment.  CRITICAL CARE Performed by: [place] Bells T O'Neal  Total critical care time: 60 minutes. Critical care  time was exclusive of separately billable procedures and treating other patients. Critical care was necessary to treat or prevent imminent or life-threatening deterioration. Critical care was time spent personally by me on the following activities: development of treatment plan with patient and/or surrogate as well as nursing, discussions with consultants, evaluation of patient's response to treatment, examination of patient, obtaining history from patient or surrogate, ordering and performing treatments and interventions, ordering and review of laboratory studies, ordering and review of radiographic studies, pulse oximetry and re-evaluation of patient's condition.  For questions or updates, please contact [place] Please consult [email or web address] for contact info under   Signed, Lake Bells T. Audie Box, MD, [place]  06/04/2022 9:32 AM

## 2022-06-04 NOTE — Progress Notes (Signed)
Hypoglycemic Event  CBG: 68  Treatment: 4 oz juice/soda  Symptoms: None  Follow-up CBG: Time:0050 CBG Result: 64  Possible Reasons for Event: Medication Regimen - Insulin gtt   Comments/MD notified: Hypoglycemic protocol repeated    CBG: 64  Treatment: 4 oz juice/soda  Symptoms: None  Follow-up CBG: Time: 0126 CBG Result: 71  Possible Reasons for Event: Medication Regimen - Insulin gtt   Comments/MD notified: Hypoglycemic standing orders completed            0201 CBG result 91     Jennifer [name]

## 2022-06-04 NOTE — Progress Notes (Signed)
Date and time results received: 06/04/22 0430  Test: Hgb Critical Value: < 6.9   Name of Provider Notified: T. Opyd, MD   Orders Received? Or Actions Taken?: Orders Received - See Orders for details

## 2022-06-04 NOTE — Progress Notes (Signed)
Hematology Labs reviewed and chart was reviewed.  Patient was not seen Acute anemia with markedly elevated LDH: Based on all the data which included the reticulocyte count and haptoglobin there does not appear to be any evidence of hemolysis. The cause of anemia is due to the acute illness superimposed on anemia of chronic kidney disease.  Recommendation: Continue with supportive care and transfuse as needed. No evidence of iron or B12 deficiencies. Literature search did not identify a clear-cut connection between hypertriglyceridemia and anemia/hemolysis (the elevation of direct bilirubin is unrelated to the anemia and it is unlikely to be the cause of the anemia)

## 2022-06-04 NOTE — Progress Notes (Signed)
Echocardiogram 2D Echocardiogram has been performed.  Oneal Deputy Alexiss Iturralde RDCS 06/04/2022, 10:25 AM

## 2022-06-04 NOTE — Progress Notes (Signed)
Brief note: - TG coming down - on Lasix 80 IV BID for volume overload per cardiology - fluctuating Cr- expect with pulmonary HTN and diuresis - do not expect as many aberrant lab values from serum draws now that TG is better- but if there is any question, can always run an iStat - nothing else to suggest now, will sign off - call with questions  Madelon Lips MD [place] [facility] [phone]

## 2022-06-05 DIAGNOSIS — R570 Cardiogenic shock: Secondary | ICD-10-CM | POA: Diagnosis not present

## 2022-06-05 DIAGNOSIS — N179 Acute kidney failure, unspecified: Secondary | ICD-10-CM | POA: Diagnosis not present

## 2022-06-05 DIAGNOSIS — N1831 Chronic kidney disease, stage 3a: Secondary | ICD-10-CM | POA: Diagnosis not present

## 2022-06-05 DIAGNOSIS — E781 Pure hyperglyceridemia: Secondary | ICD-10-CM | POA: Diagnosis not present

## 2022-06-05 DIAGNOSIS — I5022 Chronic systolic (congestive) heart failure: Secondary | ICD-10-CM | POA: Diagnosis not present

## 2022-06-05 LAB — GLUCOSE, CAPILLARY
Glucose-Capillary: 107 mg/dL — ABNORMAL HIGH (ref 70–99)
Glucose-Capillary: 117 mg/dL — ABNORMAL HIGH (ref 70–99)
Glucose-Capillary: 129 mg/dL — ABNORMAL HIGH (ref 70–99)
Glucose-Capillary: 129 mg/dL — ABNORMAL HIGH (ref 70–99)
Glucose-Capillary: 217 mg/dL — ABNORMAL HIGH (ref 70–99)
Glucose-Capillary: 255 mg/dL — ABNORMAL HIGH (ref 70–99)
Glucose-Capillary: 257 mg/dL — ABNORMAL HIGH (ref 70–99)
Glucose-Capillary: 317 mg/dL — ABNORMAL HIGH (ref 70–99)
Glucose-Capillary: 328 mg/dL — ABNORMAL HIGH (ref 70–99)
Glucose-Capillary: 73 mg/dL (ref 70–99)
Glucose-Capillary: 84 mg/dL (ref 70–99)
Glucose-Capillary: 84 mg/dL (ref 70–99)
Glucose-Capillary: 87 mg/dL (ref 70–99)
Glucose-Capillary: 99 mg/dL (ref 70–99)

## 2022-06-05 LAB — HEPATIC FUNCTION PANEL
ALT: 111 U/L — ABNORMAL HIGH (ref 0–44)
AST: 255 U/L — ABNORMAL HIGH (ref 15–41)
Albumin: 2 g/dL — ABNORMAL LOW (ref 3.5–5.0)
Alkaline Phosphatase: 211 U/L — ABNORMAL HIGH (ref 38–126)
Bilirubin, Direct: 0.9 mg/dL — ABNORMAL HIGH (ref 0.0–0.2)
Indirect Bilirubin: 0.7 mg/dL (ref 0.3–0.9)
Total Bilirubin: 1.6 mg/dL — ABNORMAL HIGH (ref 0.3–1.2)
Total Protein: 5.9 g/dL — ABNORMAL LOW (ref 6.5–8.1)

## 2022-06-05 LAB — BASIC METABOLIC PANEL
Anion gap: 14 (ref 5–15)
BUN: 25 mg/dL — ABNORMAL HIGH (ref 6–20)
CO2: 24 mmol/L (ref 22–32)
Calcium: 7.1 mg/dL — ABNORMAL LOW (ref 8.9–10.3)
Chloride: 89 mmol/L — ABNORMAL LOW (ref 98–111)
Creatinine, Ser: 2.42 mg/dL — ABNORMAL HIGH (ref 0.44–1.00)
GFR, Estimated: 27 mL/min — ABNORMAL LOW (ref >60)
Glucose, Bld: 111 mg/dL — ABNORMAL HIGH (ref 70–99)
Potassium: 3.3 mmol/L — ABNORMAL LOW (ref 3.5–5.1)
Sodium: 127 mmol/L — ABNORMAL LOW (ref 135–145)

## 2022-06-05 LAB — CBC
HCT: 22.9 % — ABNORMAL LOW (ref 36.0–46.0)
Hemoglobin: 7.7 g/dL — ABNORMAL LOW (ref 12.0–15.0)
MCH: 28.4 pg (ref 26.0–34.0)
MCHC: 33.6 g/dL (ref 30.0–36.0)
MCV: 84.5 fL (ref 80.0–100.0)
Platelets: 147 10*3/uL — ABNORMAL LOW (ref 150–400)
RBC: 2.71 MIL/uL — ABNORMAL LOW (ref 3.87–5.11)
RDW: 16.8 % — ABNORMAL HIGH (ref 11.5–15.5)
WBC: 10.1 10*3/uL (ref 4.0–10.5)
nRBC: 0.7 % — ABNORMAL HIGH (ref 0.0–0.2)

## 2022-06-05 LAB — COOXEMETRY PANEL
Carboxyhemoglobin: 0.9 % (ref 0.5–1.5)
Methemoglobin: <0.7 % (ref 0.0–1.5)
O2 Saturation: 75.1 %
Total hemoglobin: 7.5 g/dL — ABNORMAL LOW (ref 12.0–16.0)

## 2022-06-05 LAB — MAGNESIUM: Magnesium: 1.7 mg/dL (ref 1.7–2.4)

## 2022-06-05 LAB — TRIGLYCERIDES: Triglycerides: 1218 mg/dL — ABNORMAL HIGH (ref <150)

## 2022-06-05 LAB — BRAIN NATRIURETIC PEPTIDE: B Natriuretic Peptide: 339.5 pg/mL — ABNORMAL HIGH (ref 0.0–100.0)

## 2022-06-05 LAB — TROPONIN I (HIGH SENSITIVITY): Troponin I (High Sensitivity): 42 ng/L — ABNORMAL HIGH (ref <18)

## 2022-06-05 MED ORDER — HEPARIN SODIUM (PORCINE) 5000 UNIT/ML IJ SOLN
5000.0000 [IU] | Freq: Three times a day (TID) | INTRAMUSCULAR | Status: DC
  Administered 2022-06-05 – 2022-06-12 (×21): 5000 [IU] via SUBCUTANEOUS
  Filled 2022-06-05 (×21): qty 1

## 2022-06-05 MED ORDER — FUROSEMIDE 10 MG/ML IJ SOLN
80.0000 mg | Freq: Two times a day (BID) | INTRAMUSCULAR | Status: DC
  Filled 2022-06-05: qty 8

## 2022-06-05 MED ORDER — SPIRONOLACTONE 12.5 MG HALF TABLET
12.5000 mg | ORAL_TABLET | Freq: Every day | ORAL | Status: DC
  Administered 2022-06-05 – 2022-06-06 (×2): 12.5 mg via ORAL
  Filled 2022-06-05 (×2): qty 1

## 2022-06-05 MED ORDER — MAGNESIUM SULFATE 2 GM/50ML IV SOLN
2.0000 g | Freq: Once | INTRAVENOUS | Status: AC
  Administered 2022-06-05: 2 g via INTRAVENOUS
  Filled 2022-06-05: qty 50

## 2022-06-05 MED ORDER — DEXTROSE 10 % IV SOLN: INTRAVENOUS | Status: DC

## 2022-06-05 MED ORDER — POTASSIUM CHLORIDE CRYS ER 20 MEQ PO TBCR
40.0000 meq | EXTENDED_RELEASE_TABLET | Freq: Four times a day (QID) | ORAL | Status: AC
  Administered 2022-06-05 (×2): 40 meq via ORAL
  Filled 2022-06-05 (×2): qty 2

## 2022-06-05 MED ORDER — FUROSEMIDE 10 MG/ML IJ SOLN
80.0000 mg | Freq: Two times a day (BID) | INTRAMUSCULAR | Status: DC
  Administered 2022-06-05 – 2022-06-07 (×5): 80 mg via INTRAVENOUS
  Filled 2022-06-05 (×5): qty 8

## 2022-06-05 MED ORDER — INSULIN (MYXREDLIN) INFUSION FOR HYPERTRIGLYCERIDEMIA
0.1000 [IU]/kg/h | INTRAVENOUS | Status: DC
  Administered 2022-06-05 – 2022-06-09 (×7): 0.1 [IU]/kg/h via INTRAVENOUS
  Filled 2022-06-05 (×7): qty 100

## 2022-06-05 MED ORDER — INSULIN REGULAR(HUMAN) IN NACL 100-0.9 UT/100ML-% IV SOLN: INTRAVENOUS | Status: DC

## 2022-06-05 MED ORDER — FUROSEMIDE 10 MG/ML IJ SOLN
80.0000 mg | Freq: Once | INTRAMUSCULAR | Status: AC
  Administered 2022-06-05: 80 mg via INTRAVENOUS

## 2022-06-05 NOTE — Progress Notes (Signed)
PROGRESS NOTE    Jennifer [name]  [id number] DOB: 1991-02-10 DOA: 05/29/2022 PCP: Sue Lush, PA-C    Chief Complaint  Patient presents with   Abdominal Pain    Brief Narrative:   32 year old woman who presented to [facility] ED with abdominal pain, nausea and vomiting. PMHx significant for HTN, HLD, CHF (Echo 11/2021 EF 50-55%, no RWMAs, mildly elevated PASP), pulmonary HTN (s/p CardioMEMS implantation, on Corlanor and sildenafil), T2DM (followed by [facility]), NAFLD, hypothyroidism, astrocytoma, PCOS.   On presentation to ED, patient was found to have an anion gap with acidosis consistent with DKA/hyperglycemia. Was also found to have a TG > 5000.  Concern for ongoing pancreatitis.  CT A/P 3/3 without acute process, no e/o pancreatitis; +hepatosplenomegaly. -Patient's anion gap has closed, she was transitioned off insulin drip, but her workup was noted for evolving anemia, suspicious for hemolytic anemia with elevated LFTs, and LDH, as well patient with significant difficulty with lab accuracy likely in the setting of her hypertriglyceridemia.  As well with significant volume overload, managed by advanced CHF team secondary to acute on chronic systolic CHF.    Assessment & Plan:   Principal Problem:   Hypertriglyceridemia Active Problems:   Acute renal failure superimposed on stage 3a chronic kidney disease (HCC)   History of astrocytoma of brain   Type 2 diabetes mellitus with hyperlipidemia (HCC)   Hypothyroidism   Chronic systolic CHF (congestive heart failure) (HCC)   Essential hypertension   Nonischemic cardiomyopathy (HCC)   Elevated liver enzymes   Pulmonary hypertension (HCC)   NASH (nonalcoholic steatohepatitis)   UTI (urinary tract infection) due to Enterococcus   E. coli UTI (urinary tract infection)   DKA (diabetic ketoacidosis) (HCC)  Diabetic ketoacidosis, type 2 diabetes with hyperglycemia AGMA secondary to above -He is on insulin U-500 at home,  reports she has been compliant, reports she ran out of Dexcom refills, so she was not checking her CBGs.. -Insulin GTT DKA protocol has been discontinued after anion gap closed.  On 3/5. -On insulin gtt. hyper triglyceridemia protocol, now triglycerides much improved, will discontinue D10W and transition insulin drip Endo tool protocol(as overall she will be very difficult to control with sliding scale regimen given her significant amount of U-500 she is requiring at home(requiring around 1000 units of U-500 daily at home).  hypertriglyceridemia Nausea, vomiting, abdominal pain History of chronic pancreatitis ?? multifactorial chylomicronemia syndrome -With known history of pancreatitis in the past due to a familial hypertriglyceridemia -[name] on home Repatha -Continue fenofibrate and statin. -started  on insulin gtt. for triglyceride> 5000, glyceride continues to improve, it is 868 today, will DC her insulin drip today.  .. -Continue with Creon -Lipase within normal limit, no epigastric pain, no evidence of pancreatitis on imaging.   Acute on chronic diastolic/systolic CHF Anasarca cardiogenic shock Acute hypoxic respiratory failure -cardiology  assistance greatly appreciated, started on milrinone drip. -Diuresis per cardiology, back on Lasix 80 mg IV twice daily -Drop in EF to 35%, started on Aldactone  Anemia  -Due to anemia, with markedly elevated LDH, hematology input greatly appreciated, does not appear to be hemolysis . - -CT abdomen pelvis obtained, no evidence of retroperitoneal hematoma  -No evidence of B12 or iron deficiency  Hyperkalemia/hyponatremia -Significant electrolyte variation, due to inaccurate reading from her severe hypertriglyceridemia.  More accurate reading currently now her triglyceride has been controlled   Pulmonary hypertension - Continue Corlanor,  - sildenafil hold per cardiology   AKI (likely in the setting of  DKA, UTI) on baseline CKD stage 4 -  Avoid nephrotoxic agents as able - Ensure adequate renal perfusion - creatinine rising up, 2.8 today, likely cardiorenal syndrome  E. Coli and enterococcus UTI -She was treated with Rocephin initially, then transition to Pelzer given sensitivities, it was discontinued given questionable hemolytic anemia .   Transaminitis, has chronic hepatosplenomegaly History of hepatic steatosis/fatty liver ?? 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    Hypothyroid TSH 5.284, Free T4 0.74.  - Continue levothyroxine   Deconditioning - PT consulted  Nontypical chest pain -Nontypical, pleuritic, EKG nonacute, encouraged use incentive spirometer, negative troponins  Hyperkalemia -Samples usually hemolyzed  DVT prophylaxis: Heparin stopped due to anemia, her hemoglobin has been stable it will be resumed. Code Status: Full Family Communication: none at bedside Disposition:   Status is: Inpatient    Consultants:  PCCM Renal Hematology Cardiology   Subjective:  Reports some chest discomfort, positional, reports she is feeling much better today,  Objective: Vitals:   06/05/22 0400 06/05/22 0500 06/05/22 0600 06/05/22 0700  BP: 121/66 132/72 134/85 125/65  Pulse: 96 100 (!) 101 (!) 105  Resp: (!) 38 (!) 24 (!) 28 (!) 24  Temp:      TempSrc:      SpO2: 92% 100% 99% 97%  Weight:  68.4 kg    Height:        Intake/Output Summary (Last 24 hours) at 06/05/2022 0951 Last data filed at 06/05/2022 0747 Gross per 24 hour  Intake 1447.47 ml  Output 7400 ml  Net -5952.53 ml   Filed Weights   06/03/22 0500 06/04/22 0500 06/05/22 0500  Weight: 69.6 kg 71.9 kg 68.4 kg    Examination:   Awake Alert, Oriented X 3, No new F.N deficits, Normal affect, right eye  ptosis at baseline Symmetrical Chest wall movement, diminished at the bases bilaterally RRR,No Gallops,Rubs or new Murmurs, No Parasternal Heave +ve B.Sounds, Abd distended no rebound - guarding or rigidity. No Cyanosis, Clubbing ,trace  edema, No new Rash or bruise         Data Reviewed: I have personally reviewed following labs and imaging studies  CBC: Recent Labs  Lab 06/01/22 1822 06/02/22 0208 06/02/22 1009 06/03/22 0339 06/03/22 0705 06/03/22 0811 06/04/22 0400 06/04/22 0838 06/05/22 0808  WBC 5.9 6.8  --  9.0  --   --  8.7  --  10.1  NEUTROABS 3.8  --   --   --   --   --   --   --   --   HGB 7.6* 8.3*   < > 7.2* 9.9* 8.5* 7.7* 8.2* 7.7*  HCT 24.2* 24.4*   < > 24.0* 29.0* 25.0* 21.5* 24.0* 22.9*  MCV 90.0 87.5  --  89.6  --   --  87.4  --  84.5  PLT 158 174  --  166  --   --  100*  --  147*   < > = values in this interval not displayed.    Basic Metabolic Panel: Recent Labs  Lab 05/30/22 1905 05/31/22 0155 06/01/22 1038 06/01/22 1548 06/02/22 0208 06/02/22 0620 06/02/22 1009 06/03/22 0339 06/03/22 0705 06/03/22 0811 06/04/22 0400 06/04/22 0838 06/04/22 1701 06/05/22 0432  NA 136 133* 130*   < > 128* 121*   < > 129*   < >  133* 129* 131* 127* 127*  K 3.1* 4.4 7.1*   < > SPECIMEN HEMOLYZED. HEMOLYSIS MAY AFFECT INTEGRITY OF RESULTS. SPECIMEN HEMOLYZED. HEMOLYSIS MAY AFFECT INTEGRITY OF RESULTS.   < > SPECIMEN HEMOLYZED. HEMOLYSIS MAY AFFECT INTEGRITY OF RESULTS.   < > 4.2 4.1 4.2 4.1 3.3*  CL 108 103 98   < > 96* 92*  --  98  --  104 97* 99 89* 89*  CO2 13* 20* 21*   < > 19* 23  --  19*  --   --  20*  --  20* 24  GLUCOSE 199* 163* 242*   < > 167* 69*  --  127*  --  81 78 65* 140* 111*  BUN '19 18 18   '$ < > 20 20  --  21*  --  21* 23* 25* 23* 25*  CREATININE 1.22* 1.32* 1.59*   < > 1.63* 1.07*  --  1.90*  --  2.00* 2.41* 2.80* 2.36* 2.42*  CALCIUM 6.6* 7.8* 8.4*   < > 7.8* 7.5*  --  7.1*  --   --  7.1*  --  7.3* 7.1*  MG 1.9 2.2 1.9  --  2.0  --   --  1.8   --   --   --   --   --  1.7  PHOS 3.1 3.6 2.7  --  2.3*  --   --  3.4  --   --   --   --   --   --    < > = values in this interval not displayed.    GFR: Estimated Creatinine Clearance: 26.9 mL/min (A) (by C-G formula based on SCr of 2.42 mg/dL (H)).  Liver Function Tests: Recent Labs  Lab 06/01/22 1822 06/02/22 0208 06/02/22 1538 06/03/22 0339 06/04/22 0400  AST RESULTS UNAVAILABLE DUE TO INTERFERING SUBSTANCE SPECIMEN HEMOLYZED. HEMOLYSIS MAY AFFECT INTEGRITY OF RESULTS. 110* 142* 290*  ALT RESULTS UNAVAILABLE DUE TO INTERFERING SUBSTANCE SPECIMEN HEMOLYZED. HEMOLYSIS MAY AFFECT INTEGRITY OF RESULTS. 78* 76* 123*  ALKPHOS 154* 152* 176* 164* 167*  BILITOT 4.8* 5.1* 2.7* 3.9* 2.5*  PROT RESULTS UNAVAILABLE DUE TO INTERFERING SUBSTANCE SPECIMEN HEMOLYZED. HEMOLYSIS MAY AFFECT INTEGRITY OF RESULTS. 5.3* 5.5* 5.6*  ALBUMIN 2.7* 2.5* 2.4* 2.2* 2.1*    CBG: Recent Labs  Lab 06/05/22 0433 06/05/22 0550 06/05/22 0554 06/05/22 0655 06/05/22 0830  GLUCAP 99 73 84 87 107*     Recent Results (from the past 240 hour(s))  Urine Culture     Status: Abnormal   Collection Time: 05/29/22  9:19 AM   Specimen: Urine, Random  Result Value Ref Range Status   Specimen Description   Final    URINE, RANDOM Performed at [facility], [address] [place], [place] [address]    Special Requests   Final    NONE Reflexed from [id number] Performed at [facility], [address], [place], [place] [address]    Culture (A)  Final    >=100,000 COLONIES/mL ESCHERICHIA COLI Confirmed Extended Spectrum Beta-Lactamase Producer (ESBL).  In bloodstream infections from ESBL organisms, carbapenems are preferred over piperacillin/tazobactam. They are shown to have a lower risk of mortality. 80,000 COLONIES/mL ENTEROCOCCUS FAECALIS    Report Status 05/31/2022 FINAL  Final   Organism ID, Bacteria ESCHERICHIA COLI (A)  Final   Organism ID, Bacteria ENTEROCOCCUS  FAECALIS (A)  Final      Susceptibility   Escherichia coli - MIC*    AMPICILLIN >=32 RESISTANT  Resistant     CEFAZOLIN >=64 RESISTANT Resistant     CEFEPIME 2 SENSITIVE Sensitive     CEFTRIAXONE >=64 RESISTANT Resistant     CIPROFLOXACIN >=4 RESISTANT Resistant     GENTAMICIN <=1 SENSITIVE Sensitive     IMIPENEM <=0.25 SENSITIVE Sensitive     NITROFURANTOIN <=16 SENSITIVE Sensitive     TRIMETH/SULFA >=320 RESISTANT Resistant     AMPICILLIN/SULBACTAM 16 INTERMEDIATE Intermediate     PIP/TAZO <=4 SENSITIVE Sensitive     * >=100,000 COLONIES/mL ESCHERICHIA COLI   Enterococcus faecalis - MIC*    AMPICILLIN <=2 SENSITIVE Sensitive     NITROFURANTOIN <=16 SENSITIVE Sensitive     VANCOMYCIN 1 SENSITIVE Sensitive     * 80,000 COLONIES/mL ENTEROCOCCUS FAECALIS  MRSA Next Gen by PCR, Nasal     Status: None   Collection Time: 05/29/22  1:09 PM   Specimen: Nasal Mucosa; Nasal Swab  Result Value Ref Range Status   MRSA by PCR Next Gen NOT DETECTED NOT DETECTED Final    Comment: (NOTE) The GeneXpert MRSA Assay (FDA approved for NASAL specimens only), is one component of a comprehensive MRSA colonization surveillance program. It is not intended to diagnose MRSA infection nor to guide or monitor treatment for MRSA infections. Test performance is not FDA approved in patients less than 44 years old. Performed at [facility], [place] [address], [place], [place] [address]          Radiology Studies: ECHOCARDIOGRAM COMPLETE  Result Date: 06/04/2022    ECHOCARDIOGRAM REPORT   Patient Name:   Jennifer [name] Date of Exam: 06/04/2022 Medical Rec #:  [id number]         Height:       57.0 in Accession #:    [id number]        Weight:       158.5 lb Date of Birth:  03-22-1991         BSA:          1.629 m Patient Age:    32 years          BP:           112/75 mmHg Patient Gender: F                 HR:           66 bpm. Exam Location:  Inpatient Procedure: 2D Echo, Color Doppler, Cardiac Doppler and  Intracardiac            Opacification Agent STAT ECHO                                 MODIFIED REPORT:   This report was modified by Rudean Haskell MD on 06/04/2022 due to [place].  Indications:     [id number] Acute systolic (congestive) heart failure  History:         Patient has prior history of Echocardiogram examinations, most                  recent 12/03/2021. CHF; Risk Factors:Hypertension, Diabetes and                  Dyslipidemia.  Sonographer:     [place] Referring  Phys:  [id number] Cassie Freer O'NEAL Diagnosing Phys: Rudean Haskell MD IMPRESSIONS  1. Left ventricular ejection fraction, by estimation, is 30 to 35%. The left ventricle has moderately decreased function. Left ventricular endocardial border not optimally defined to evaluate regional wall motion. Left ventricular diastolic parameters are indeterminate.  2. Right ventricular systolic function is normal. The right ventricular size is is dilated. There is mildly elevated pulmonary artery systolic pressure. The estimated right ventricular systolic pressure is Q000111Q mmHg.  3. A small pericardial effusion is present. The pericardial effusion is posterior to the left ventricle.  4. The mitral valve is normal in structure. Moderate to severe mitral valve regurgitation.  5. TV best assessed in image 17. Tricuspid valve regurgitation is severe.  6. The aortic valve was not well visualized. Aortic valve regurgitation is mild.  7. Pulmonic valve regurgitation is moderate.  8. The inferior vena cava is dilated in size with <50% respiratory variability, suggesting right atrial pressure of 15 mmHg. Comparison(s): LV function is worse, mitral regurgitation is worse, tricuspid regurgitation is worse. Conclusion(s)/Recommendation(s): Reviewed with primary team. FINDINGS  Left Ventricle: Left ventricular ejection fraction, by estimation, is 30 to 35%. The left ventricle has moderately decreased function.  Left ventricular endocardial border not optimally defined to evaluate regional wall motion. Definity contrast agent was given IV to delineate the left ventricular endocardial borders. The left ventricular internal cavity size was normal in size. There is no left ventricular hypertrophy. Left ventricular diastolic parameters are indeterminate. Right Ventricle: The right ventricular size is is dilated. Right vetricular wall thickness was not well visualized. Right ventricular systolic function is normal. There is mildly elevated pulmonary artery systolic pressure. The tricuspid regurgitant velocity is 2.35 m/s, and with an assumed right atrial pressure of 15 mmHg, the estimated right ventricular systolic pressure is Q000111Q mmHg. Left Atrium: Left atrial size was normal in size. Right Atrium: Right atrial size was normal in size. Pericardium: A small pericardial effusion is present. The pericardial effusion is posterior to the left ventricle. Mitral Valve: The mitral valve is normal in structure. Moderate to severe mitral valve regurgitation. Tricuspid Valve: TV best assessed in image 17. The tricuspid valve is normal in structure. Tricuspid valve regurgitation is severe. Aortic Valve: The aortic valve was not well visualized. Aortic valve regurgitation is mild. Pulmonic Valve: The pulmonic valve was normal in structure. Pulmonic valve regurgitation is moderate. Aorta: The aortic root and ascending aorta are structurally normal, with no evidence of dilitation. Venous: The inferior vena cava is dilated in size with less than 50% respiratory variability, suggesting right atrial pressure of 15 mmHg. IAS/Shunts: No atrial level shunt detected by color flow Doppler.  LEFT VENTRICLE PLAX 2D LVIDd:         4.20 cm     Diastology LVIDs:         3.05 cm     LV e' medial:    7.18 cm/s LV PW:         1.10 cm     LV E/e' medial:  15.3 LV IVS:        0.90 cm     LV e' lateral:   11.30 cm/s LVOT diam:     1.80 cm     LV E/e' lateral:  9.7 LV SV:         44 LV SV Index:   27 LVOT Area:     2.54 cm  LV Volumes (MOD) LV vol d, MOD A4C: 83.1 ml LV vol s, MOD  A4C: 52.4 ml LV SV MOD A4C:     83.1 ml RIGHT VENTRICLE RV S prime:     12.70 cm/s TAPSE (M-mode): 2.4 cm LEFT ATRIUM             Index        RIGHT ATRIUM           Index LA diam:        3.30 cm 2.03 cm/m   RA Area:     12.30 cm LA Vol (A2C):   39.7 ml 24.36 ml/m  RA Volume:   28.20 ml  17.31 ml/m LA Vol (A4C):   37.2 ml 22.83 ml/m LA Biplane Vol: 38.4 ml 23.57 ml/m  AORTIC VALVE LVOT Vmax:   97.90 cm/s LVOT Vmean:  69.900 cm/s LVOT VTI:    0.173 m  AORTA Ao Root diam: 2.20 cm Ao Asc diam:  3.00 cm MITRAL VALVE                TRICUSPID VALVE MV Area (PHT): 4.77 cm     TR Peak grad:   22.1 mmHg MV Decel Time: 159 msec     TR Vmax:        235.00 cm/s MV E velocity: 110.00 cm/s MV A velocity: 65.70 cm/s   SHUNTS MV E/A ratio:  1.67         Systemic VTI:  0.17 m                             Systemic Diam: 1.80 cm Rudean Haskell MD Electronically signed by Rudean Haskell MD Signature Date/Time: 06/04/2022/2:16:27 PM    Final (Updated)    DG Chest Port 1 View  Result Date: 06/03/2022 CLINICAL DATA:  Hypoxia. EXAM: PORTABLE CHEST 1 VIEW COMPARISON:  Chest x-ray dated June 01, 2022. FINDINGS: New right upper extremity PICC line with tip in the proximal right atrium. Unchanged cardiomegaly. Diffuse interstitial thickening and hazy airspace opacities throughout both lungs have mildly worsened in the interval. No pneumothorax or large pleural effusion. No acute osseous abnormality. IMPRESSION: 1. Mildly worsened pulmonary edema. 2. New right upper extremity PICC line with tip in the proximal right atrium. Electronically Signed   By: Titus Dubin M.D.   On: 06/03/2022 10:19        Scheduled Meds:  sodium chloride   Intravenous Once   aspirin EC  81 mg Oral Daily   Chlorhexidine Gluconate Cloth  6 each Topical Daily   DULoxetine  30 mg Oral QHS   fenofibrate  160 mg Oral  Daily   furosemide  80 mg Intravenous BID   levothyroxine  125 mcg Oral Q0600   lipase/protease/amylase  36,000 Units Oral TID   loratadine  10 mg Oral Daily   pantoprazole  40 mg Oral Daily   potassium chloride  40 mEq Oral Q6H   rosuvastatin  40 mg Oral Daily   sodium bicarbonate  650 mg Oral BID   sodium chloride flush  10-40 mL Intracatheter Q12H   spironolactone  12.5 mg Oral Daily   Continuous Infusions:  insulin     magnesium sulfate bolus IVPB     milrinone 0.25 mcg/kg/min (06/05/22 0700)     LOS: 7 days      Phillips Climes, MD [facility]   To contact the attending provider between 7A-7P or the covering provider during after hours 7P-7A, please log into the web site [email or web address] and access using universal  [place] password for that web site. If you do not have the password, please call the hospital operator.  06/05/2022, 9:51 AM

## 2022-06-05 NOTE — Progress Notes (Addendum)
Cardiology Progress Note  Patient ID: Jennifer [name] MRN: [id number] DOB: [birth year] Date of Encounter: 06/05/2022  Primary Cardiologist: None  Subjective   Chief Complaint: Chest pain  HPI: Reports dull chest discomfort.  Seems to be positional.  Seems to be noncardiac.  Co. ox numbers are much improved.  Good urine output.  Kidney function is stable.  ROS:  All other ROS reviewed and negative. Pertinent positives noted in the HPI.     Inpatient Medications  Scheduled Meds:  sodium chloride   Intravenous Once   aspirin EC  81 mg Oral Daily   Chlorhexidine Gluconate Cloth  6 each Topical Daily   DULoxetine  30 mg Oral QHS   fenofibrate  160 mg Oral Daily   furosemide  80 mg Intravenous BID   levothyroxine  125 mcg Oral Q0600   lipase/protease/amylase  36,000 Units Oral TID   loratadine  10 mg Oral Daily   pantoprazole  40 mg Oral Daily   potassium chloride  40 mEq Oral Q6H   rosuvastatin  40 mg Oral Daily   sodium bicarbonate  650 mg Oral BID   sodium chloride flush  10-40 mL Intracatheter Q12H   spironolactone  12.5 mg Oral Daily   Continuous Infusions:  insulin     magnesium sulfate bolus IVPB     milrinone 0.25 mcg/kg/min (06/05/22 0700)   PRN Meds: acetaminophen, albuterol, dextrose, docusate sodium, guaiFENesin-dextromethorphan, HYDROmorphone (DILAUDID) injection, ondansetron (ZOFRAN) IV, mouth rinse, polyethylene glycol, prochlorperazine, sodium chloride flush   Vital Signs   Vitals:   06/05/22 0400 06/05/22 0500 06/05/22 0600 06/05/22 0700  BP: 121/66 132/72 134/85 125/65  Pulse: 96 100 (!) 101 (!) 105  Resp: (!) 38 (!) 24 (!) 28 (!) 24  Temp:      TempSrc:      SpO2: 92% 100% 99% 97%  Weight:  68.4 kg    Height:        Intake/Output Summary (Last 24 hours) at 06/05/2022 0943 Last data filed at 06/05/2022 0747 Gross per 24 hour  Intake 1447.47 ml  Output 7400 ml  Net -5952.53 ml      06/05/2022    5:00 AM 06/04/2022    5:00 AM 06/03/2022    5:00  AM  Last 3 Weights  Weight (lbs) 150 lb 12.7 oz 158 lb 8.2 oz 153 lb 7 oz  Weight (kg) 68.4 kg 71.9 kg 69.6 kg      Telemetry  Overnight telemetry shows sinus tachycardia heart rate 100s, which I personally reviewed.    Physical Exam   Vitals:   06/05/22 0400 06/05/22 0500 06/05/22 0600 06/05/22 0700  BP: 121/66 132/72 134/85 125/65  Pulse: 96 100 (!) 101 (!) 105  Resp: (!) 38 (!) 24 (!) 28 (!) 24  Temp:      TempSrc:      SpO2: 92% 100% 99% 97%  Weight:  68.4 kg    Height:        Intake/Output Summary (Last 24 hours) at 06/05/2022 0943 Last data filed at 06/05/2022 0747 Gross per 24 hour  Intake 1447.47 ml  Output 7400 ml  Net -5952.53 ml       06/05/2022    5:00 AM 06/04/2022    5:00 AM 06/03/2022    5:00 AM  Last 3 Weights  Weight (lbs) 150 lb 12.7 oz 158 lb 8.2 oz 153 lb 7 oz  Weight (kg) 68.4 kg 71.9 kg 69.6 kg    Body mass index is 32.63 kg/m.  General: Well nourished, well developed, in no acute distress Head: Atraumatic, normal size  Eyes: PEERLA, EOMI  Neck: Supple, JVD 10 to 12 cm of water Endocrine: No thryomegaly Cardiac: Normal S1, S2; RRR; no murmurs, rubs, or gallops Lungs: Diminished breath sounds bilaterally Abd: Distended abdomen Ext: No edema, pulses 2+ Musculoskeletal: No deformities, BUE and BLE strength normal and equal Skin: Warm and dry, no rashes   Neuro: Alert and oriented to person, place, time, and situation, CNII-XII grossly intact, no focal deficits  Psych: Normal mood and affect   Labs  High Sensitivity Troponin:   Recent Labs  Lab 06/01/22 1510 06/01/22 1822  TROPONINIHS 16 14     Cardiac EnzymesNo results for input(s): "TROPONINI" in the last 168 hours. No results for input(s): "TROPIPOC" in the last 168 hours.  Chemistry Recent Labs  Lab 06/02/22 1538 06/03/22 0339 06/03/22 0705 06/04/22 0400 06/04/22 0838 06/04/22 1701 06/05/22 0432  NA  --  129*   < > 129* 131* 127* 127*  K  --  SPECIMEN HEMOLYZED. HEMOLYSIS MAY  AFFECT INTEGRITY OF RESULTS.   < > 4.1 4.2 4.1 3.3*  CL  --  98   < > 97* 99 89* 89*  CO2  --  19*  --  20*  --  20* 24  GLUCOSE  --  127*   < > 78 65* 140* 111*  BUN  --  21*   < > 23* 25* 23* 25*  CREATININE  --  1.90*   < > 2.41* 2.80* 2.36* 2.42*  CALCIUM  --  7.1*  --  7.1*  --  7.3* 7.1*  PROT 5.3* 5.5*  --  5.6*  --   --   --   ALBUMIN 2.4* 2.2*  --  2.1*  --   --   --   AST 110* 142*  --  290*  --   --   --   ALT 78* 76*  --  123*  --   --   --   ALKPHOS 176* 164*  --  167*  --   --   --   BILITOT 2.7* 3.9*  --  2.5*  --   --   --   GFRNONAA  --  36*  --  27*  --  28* 27*  ANIONGAP  --  12  --  12  --  18* 14   < > = values in this interval not displayed.    Hematology Recent Labs  Lab 06/03/22 0339 06/03/22 0705 06/04/22 0400 06/04/22 0838 06/05/22 0808  WBC 9.0  --  8.7  --  10.1  RBC 2.68*  --  2.46*  --  2.71*  HGB 7.2*   < > 7.7* 8.2* 7.7*  HCT 24.0*   < > 21.5* 24.0* 22.9*  MCV 89.6  --  87.4  --  84.5  MCH 26.9  --  31.3  --  28.4  MCHC 30.0  --  35.8  --  33.6  RDW 19.5*  --  17.3*  --  16.8*  PLT 166  --  100*  --  147*   < > = values in this interval not displayed.   BNP Recent Labs  Lab 06/04/22 0400 06/05/22 0432  BNP 550.1* 339.5*    DDimer No results for input(s): "DDIMER" in the last 168 hours.   Radiology  ECHOCARDIOGRAM COMPLETE  Result Date: 06/04/2022    ECHOCARDIOGRAM REPORT   Patient Name:  Jennifer [name] Date of Exam: 06/04/2022 Medical Rec #:  [id number]         Height:       57.0 in Accession #:    [id number]        Weight:       158.5 lb Date of Birth:  Apr 01, 1990         BSA:          1.629 m Patient Age:    32 years          BP:           112/75 mmHg Patient Gender: F                 HR:           66 bpm. Exam Location:  Inpatient Procedure: 2D Echo, Color Doppler, Cardiac Doppler and Intracardiac            Opacification Agent STAT ECHO                                 MODIFIED REPORT:   This report was modified by Rudean Haskell MD on 06/04/2022 due to [place].  Indications:     [id number] Acute systolic (congestive) heart failure  History:         Patient has prior history of Echocardiogram examinations, most                  recent 12/03/2021. CHF; Risk Factors:Hypertension, Diabetes and                  Dyslipidemia.  Sonographer:     Raquel Sarna Senior RDCS Referring Phys:  [id number] [place] Diagnosing Phys: Rudean Haskell MD IMPRESSIONS  1. Left ventricular ejection fraction, by estimation, is 30 to 35%. The left ventricle has moderately decreased function. Left ventricular endocardial border not optimally defined to evaluate regional wall motion. Left ventricular diastolic parameters are indeterminate.  2. Right ventricular systolic function is normal. The right ventricular size is is dilated. There is mildly elevated pulmonary artery systolic pressure. The estimated right ventricular systolic pressure is Q000111Q mmHg.  3. A small pericardial effusion is present. The pericardial effusion is posterior to the left ventricle.  4. The mitral valve is normal in structure. Moderate to severe mitral valve regurgitation.  5. TV best assessed in image 17. Tricuspid valve regurgitation is severe.  6. The aortic valve was not well visualized. Aortic valve regurgitation is mild.  7. Pulmonic valve regurgitation is moderate.  8. The inferior vena cava is dilated in size with <50% respiratory variability, suggesting right atrial pressure of 15 mmHg. Comparison(s): LV function is worse, mitral regurgitation is worse, tricuspid regurgitation is worse. Conclusion(s)/Recommendation(s): Reviewed with primary team. FINDINGS  Left Ventricle: Left ventricular ejection fraction, by estimation, is 30 to 35%. The left ventricle has moderately decreased function. Left ventricular endocardial border not optimally defined to evaluate regional wall motion. Definity contrast agent was given IV to delineate  the left ventricular endocardial borders. The left ventricular internal cavity size was normal in size. There is no left ventricular hypertrophy. Left ventricular diastolic parameters are indeterminate. Right Ventricle: The right ventricular size is is dilated. Right vetricular wall thickness was not  well visualized. Right ventricular systolic function is normal. There is mildly elevated pulmonary artery systolic pressure. The tricuspid regurgitant velocity is 2.35 m/s, and with an assumed right atrial pressure of 15 mmHg, the estimated right ventricular systolic pressure is Q000111Q mmHg. Left Atrium: Left atrial size was normal in size. Right Atrium: Right atrial size was normal in size. Pericardium: A small pericardial effusion is present. The pericardial effusion is posterior to the left ventricle. Mitral Valve: The mitral valve is normal in structure. Moderate to severe mitral valve regurgitation. Tricuspid Valve: TV best assessed in image 17. The tricuspid valve is normal in structure. Tricuspid valve regurgitation is severe. Aortic Valve: The aortic valve was not well visualized. Aortic valve regurgitation is mild. Pulmonic Valve: The pulmonic valve was normal in structure. Pulmonic valve regurgitation is moderate. Aorta: The aortic root and ascending aorta are structurally normal, with no evidence of dilitation. Venous: The inferior vena cava is dilated in size with less than 50% respiratory variability, suggesting right atrial pressure of 15 mmHg. IAS/Shunts: No atrial level shunt detected by color flow Doppler.  LEFT VENTRICLE PLAX 2D LVIDd:         4.20 cm     Diastology LVIDs:         3.05 cm     LV e' medial:    7.18 cm/s LV PW:         1.10 cm     LV E/e' medial:  15.3 LV IVS:        0.90 cm     LV e' lateral:   11.30 cm/s LVOT diam:     1.80 cm     LV E/e' lateral: 9.7 LV SV:         44 LV SV Index:   27 LVOT Area:     2.54 cm  LV Volumes (MOD) LV vol d, MOD A4C: 83.1 ml LV vol s, MOD A4C: 52.4 ml LV SV  MOD A4C:     83.1 ml RIGHT VENTRICLE RV S prime:     12.70 cm/s TAPSE (M-mode): 2.4 cm LEFT ATRIUM             Index        RIGHT ATRIUM           Index LA diam:        3.30 cm 2.03 cm/m   RA Area:     12.30 cm LA Vol (A2C):   39.7 ml 24.36 ml/m  RA Volume:   28.20 ml  17.31 ml/m LA Vol (A4C):   37.2 ml 22.83 ml/m LA Biplane Vol: 38.4 ml 23.57 ml/m  AORTIC VALVE LVOT Vmax:   97.90 cm/s LVOT Vmean:  69.900 cm/s LVOT VTI:    0.173 m  AORTA Ao Root diam: 2.20 cm Ao Asc diam:  3.00 cm MITRAL VALVE                TRICUSPID VALVE MV Area (PHT): 4.77 cm     TR Peak grad:   22.1 mmHg MV Decel Time: 159 msec     TR Vmax:        235.00 cm/s MV E velocity: 110.00 cm/s MV A velocity: 65.70 cm/s   SHUNTS MV E/A ratio:  1.67         Systemic VTI:  0.17 m                             Systemic  Diam: 1.80 cm Rudean Haskell MD Electronically signed by Rudean Haskell MD Signature Date/Time: 06/04/2022/2:16:27 PM    Final (Updated)    DG Chest Port 1 View  Result Date: 06/03/2022 CLINICAL DATA:  Hypoxia. EXAM: PORTABLE CHEST 1 VIEW COMPARISON:  Chest x-ray dated June 01, 2022. FINDINGS: New right upper extremity PICC line with tip in the proximal right atrium. Unchanged cardiomegaly. Diffuse interstitial thickening and hazy airspace opacities throughout both lungs have mildly worsened in the interval. No pneumothorax or large pleural effusion. No acute osseous abnormality. IMPRESSION: 1. Mildly worsened pulmonary edema. 2. New right upper extremity PICC line with tip in the proximal right atrium. Electronically Signed   By: Titus Dubin M.D.   On: 06/03/2022 10:19    Cardiac Studies  TTE 06/04/2022  1. Left ventricular ejection fraction, by estimation, is 30 to 35%. The  left ventricle has moderately decreased function. Left ventricular  endocardial border not optimally defined to evaluate regional wall motion.  Left ventricular diastolic parameters  are indeterminate.   2. Right ventricular systolic  function is normal. The right ventricular  size is is dilated. There is mildly elevated pulmonary artery systolic  pressure. The estimated right ventricular systolic pressure is Q000111Q mmHg.   3. A small pericardial effusion is present. The pericardial effusion is  posterior to the left ventricle.   4. The mitral valve is normal in structure. Moderate to severe mitral  valve regurgitation.   5. TV best assessed in image 17. Tricuspid valve regurgitation is severe.   6. The aortic valve was not well visualized. Aortic valve regurgitation  is mild.   7. Pulmonic valve regurgitation is moderate.   8. The inferior vena cava is dilated in size with <50% respiratory  variability, suggesting right atrial pressure of 15 mmHg.   RHC/LHC 12/03/2020 Right and left heart catheterization 12/03/2020: Normal right heart catheterization pressures.  RA 4/1, mean 1; RV 23/4, EDP 6; PA 22/8, mean 11 and PA saturation 76%.  PW 9/7, mean 7 mmHg. CO 5.6, CI 3.88.  QP/QS 1.00. LV: 136/9, EDP 17 mmHg.  Ao 131/86, mean 105 mmHg.  No pressure gradient across the aortic valve. RCA: A very small but dominant RCA which is diffusely diseased around 80%.  Mid segment 99% occlusion.  Conus branch collateralizes distal RCA. LM: Large, smooth and normal. LAD: Large vessel, minimal diffuse disease.  Small to moderate-sized D1 and D2. CX: Large vessel.  Minimal diffuse disease.  Gives origin to large OM1 and OM 2.   Impression: Nonischemic cardiomyopathy, single vessel occlusion does not explain her markedly reduced LVEF.  Medical therapy for CAD and nonischemic cardiomyopathy.  I have started her on carvedilol 3.125 mg twice daily.  Patient Profile  Jennifer [name] is a 32 y.o. female with nonischemic systolic heart failure with recovered ejection fraction, severe hypertriglyceridemia, CAD, chronic pancreatitis, obesity who was admitted on 05/29/2022 for DKA and pancreatitis due to hypertriglyceridemia.  Cardiology was consulted  for volume overload.   Assessment & Plan   # Cardiogenic shock #Acute on chronic systolic heart failure, EF 30-35% #Anasarca -Admitted with hypertriglyceridemia, pancreatitis and DKA.  Was volume resuscitated but has developed volume overload and cardiogenic shock. -Co. ox has improved on milrinone 0.25 mcg/kg/min.  Continue this.  Trend daily Co. Ox. -Still volume up.  Continue Lasix 80 mg IV twice daily. -Echo shows LV function is worse.  She has significant mitral valve regurgitation and tricuspid regurgitation.  Suspect this is likely functional.  Continue with aggressive  IV diuresis. -No AV nodal agents.  I have added Aldactone. -Hopefully her kidney function will continue to improve. -We will have advanced heart failure follow-up tomorrow to help titrate milrinone.  Hopefully this will be a short-term medication for her.  #AKI # Acute liver failure -Secondary to cardiogenic shock.  Seems to be improving.  On milrinone.  # Chest pain -Reports positional chest discomfort.  Left heart catheterization in 2022 with diffusely diseased RCA.  Noted to have subtotal occlusion mid vessel.  This has been managed medically.  Suspect volume overload could be causing her discomfort.  Continue with volume removal today.  Also could just be noncardiac.  She describes positional chest discomfort.  Will try Tylenol.  EKG and troponins are pending.  # Anemia -Hemolysis unlikely per hematology.  Chronic disease?  Continue to follow.  # Hypertriglyceridemia #Pancreatitis -Has severely elevated triglycerides.  Seems to be improving.  Supportive care per hospital medicine.  CRITICAL CARE Performed by: [place] Bells T O'Neal  Total critical care time: 45 minutes. Critical care time was exclusive of separately billable procedures and treating other patients. Critical care was necessary to treat or prevent imminent or life-threatening deterioration. Critical care was time spent personally by me on the following  activities: development of treatment plan with patient and/or surrogate as well as nursing, discussions with consultants, evaluation of patient's response to treatment, examination of patient, obtaining history from patient or surrogate, ordering and performing treatments and interventions, ordering and review of laboratory studies, ordering and review of radiographic studies, pulse oximetry and re-evaluation of patient's condition.    For questions or updates, please contact [place] Please consult [email or web address] for contact info under        Signed, Lake Bells T. Audie Box, MD, [place]  06/05/2022 9:43 AM

## 2022-06-05 NOTE — Progress Notes (Addendum)
Notified Dr Waldron [facility] of pt's cbg of 317.  Blood sample drawn via Right PICC in safeset.  D10 infusing to left arm.  Orders received.

## 2022-06-06 DIAGNOSIS — I5043 Acute on chronic combined systolic (congestive) and diastolic (congestive) heart failure: Secondary | ICD-10-CM

## 2022-06-06 DIAGNOSIS — E781 Pure hyperglyceridemia: Secondary | ICD-10-CM | POA: Diagnosis not present

## 2022-06-06 LAB — CBC
HCT: 24.2 % — ABNORMAL LOW (ref 36.0–46.0)
Hemoglobin: 8.8 g/dL — ABNORMAL LOW (ref 12.0–15.0)
MCH: 31.3 pg (ref 26.0–34.0)
MCHC: 36.4 g/dL — ABNORMAL HIGH (ref 30.0–36.0)
MCV: 86.1 fL (ref 80.0–100.0)
Platelets: 203 10*3/uL (ref 150–400)
RBC: 2.81 MIL/uL — ABNORMAL LOW (ref 3.87–5.11)
RDW: 17.3 % — ABNORMAL HIGH (ref 11.5–15.5)
WBC: 10.4 10*3/uL (ref 4.0–10.5)
nRBC: 0.2 % (ref 0.0–0.2)

## 2022-06-06 LAB — GLUCOSE, CAPILLARY
Glucose-Capillary: 212 mg/dL — ABNORMAL HIGH (ref 70–99)
Glucose-Capillary: 179 mg/dL — ABNORMAL HIGH (ref 70–99)
Glucose-Capillary: 99 mg/dL (ref 70–99)
Glucose-Capillary: 99 mg/dL (ref 70–99)
Glucose-Capillary: 104 mg/dL — ABNORMAL HIGH (ref 70–99)
Glucose-Capillary: 57 mg/dL — ABNORMAL LOW (ref 70–99)
Glucose-Capillary: 114 mg/dL — ABNORMAL HIGH (ref 70–99)
Glucose-Capillary: 103 mg/dL — ABNORMAL HIGH (ref 70–99)
Glucose-Capillary: 144 mg/dL — ABNORMAL HIGH (ref 70–99)
Glucose-Capillary: 127 mg/dL — ABNORMAL HIGH (ref 70–99)
Glucose-Capillary: 118 mg/dL — ABNORMAL HIGH (ref 70–99)
Glucose-Capillary: 130 mg/dL — ABNORMAL HIGH (ref 70–99)
Glucose-Capillary: 159 mg/dL — ABNORMAL HIGH (ref 70–99)
Glucose-Capillary: 139 mg/dL — ABNORMAL HIGH (ref 70–99)
Glucose-Capillary: 137 mg/dL — ABNORMAL HIGH (ref 70–99)
Glucose-Capillary: 117 mg/dL — ABNORMAL HIGH (ref 70–99)

## 2022-06-06 LAB — COMPREHENSIVE METABOLIC PANEL WITH GFR
ALT: 88 U/L — ABNORMAL HIGH (ref 0–44)
AST: 132 U/L — ABNORMAL HIGH (ref 15–41)
Albumin: 2.1 g/dL — ABNORMAL LOW (ref 3.5–5.0)
Alkaline Phosphatase: 230 U/L — ABNORMAL HIGH (ref 38–126)
Anion gap: 21 — ABNORMAL HIGH (ref 5–15)
BUN: 34 mg/dL — ABNORMAL HIGH (ref 6–20)
CO2: 23 mmol/L (ref 22–32)
Calcium: 7.7 mg/dL — ABNORMAL LOW (ref 8.9–10.3)
Chloride: 88 mmol/L — ABNORMAL LOW (ref 98–111)
Creatinine, Ser: 2.6 mg/dL — ABNORMAL HIGH (ref 0.44–1.00)
GFR, Estimated: 25 mL/min — ABNORMAL LOW (ref >60)
Glucose, Bld: 108 mg/dL — ABNORMAL HIGH (ref 70–99)
Potassium: 4.3 mmol/L (ref 3.5–5.1)
Sodium: 132 mmol/L — ABNORMAL LOW (ref 135–145)
Total Bilirubin: 2.2 mg/dL — ABNORMAL HIGH (ref 0.3–1.2)
Total Protein: 6.4 g/dL — ABNORMAL LOW (ref 6.5–8.1)

## 2022-06-06 LAB — COOXEMETRY PANEL
Carboxyhemoglobin: 0.3 % — ABNORMAL LOW (ref 0.5–1.5)
Methemoglobin: <0.7 % (ref 0.0–1.5)
O2 Saturation: 60.6 %
Total hemoglobin: 8.7 g/dL — ABNORMAL LOW (ref 12.0–16.0)

## 2022-06-06 LAB — COMPREHENSIVE METABOLIC PANEL
ALT: 96 U/L — ABNORMAL HIGH (ref 0–44)
AST: 149 U/L — ABNORMAL HIGH (ref 15–41)
Albumin: 2.1 g/dL — ABNORMAL LOW (ref 3.5–5.0)
Alkaline Phosphatase: 233 U/L — ABNORMAL HIGH (ref 38–126)
Anion gap: 17 — ABNORMAL HIGH (ref 5–15)
BUN: 34 mg/dL — ABNORMAL HIGH (ref 6–20)
CO2: 24 mmol/L (ref 22–32)
Calcium: 7.5 mg/dL — ABNORMAL LOW (ref 8.9–10.3)
Chloride: 88 mmol/L — ABNORMAL LOW (ref 98–111)
Creatinine, Ser: 2.62 mg/dL — ABNORMAL HIGH (ref 0.44–1.00)
GFR, Estimated: 24 mL/min — ABNORMAL LOW (ref >60)
Glucose, Bld: 175 mg/dL — ABNORMAL HIGH (ref 70–99)
Potassium: 4.5 mmol/L (ref 3.5–5.1)
Sodium: 129 mmol/L — ABNORMAL LOW (ref 135–145)
Total Bilirubin: 2.1 mg/dL — ABNORMAL HIGH (ref 0.3–1.2)
Total Protein: 6.4 g/dL — ABNORMAL LOW (ref 6.5–8.1)

## 2022-06-06 LAB — TRIGLYCERIDES
Triglycerides: 1970 mg/dL — ABNORMAL HIGH (ref <150)
Triglycerides: 1867 mg/dL — ABNORMAL HIGH (ref <150)
Triglycerides: 2747 mg/dL — ABNORMAL HIGH (ref <150)

## 2022-06-06 LAB — MAGNESIUM: Magnesium: 2.1 mg/dL (ref 1.7–2.4)

## 2022-06-06 NOTE — Progress Notes (Signed)
Hypoglycemic Event  CBG: 1010: 57  Treatment: per patient wanted to eat cookie at bedside  Symptoms: dizziness  Follow-up CBG: Time:1040 CBG Result:114  Possible Reasons for Event: insulin drip  Comments/MD notified:Ravi Doristine Bosworth, MD    Artis Flock

## 2022-06-06 NOTE — Progress Notes (Addendum)
Advanced Heart Failure Rounding Note  PCP-Cardiologist: None   Subjective:   Over the  weekend diuresed with IV lasix. Brisk diuresis noted.   Remains on insuline drip 0.1 units + milrinone 0.25 mcg.   Denies SOB.    Objective:   Weight Range: 64.6 kg Body mass index is 30.82 kg/m.   Vital Signs:   Temp:  [97.7 F (36.5 C)-98.2 F (36.8 C)] 98.2 F (36.8 C) (03/11 0745) Pulse Rate:  [95-111] 111 (03/11 0700) Resp:  [12-24] 19 (03/11 0700) BP: (97-147)/(53-95) 147/95 (03/11 0700) SpO2:  [87 %-100 %] 98 % (03/11 0700) Weight:  [64.6 kg] 64.6 kg (03/11 0500) Last BM Date : 06/06/22  Weight change: Filed Weights   06/04/22 0500 06/05/22 0500 06/06/22 0500  Weight: 71.9 kg 68.4 kg 64.6 kg    Intake/Output:   Intake/Output Summary (Last 24 hours) at 06/06/2022 0804 Last data filed at 06/06/2022 0730 Gross per 24 hour  Intake 1088.86 ml  Output 5850 ml  Net -4761.14 ml    CVP 14-15   Physical Exam    General:   No resp difficulty HEENT: Normal Neck: Supple. JVP to jaw . Carotids 2+ bilat; no bruits. No lymphadenopathy or thyromegaly appreciated. Cor: PMI nondisplaced. Regular rate & rhythm. No rubs, gallops or murmurs. Lungs: Clear Abdomen: Soft, nontender, nondistended. No hepatosplenomegaly. No bruits or masses. Good bowel sounds. Extremities: No cyanosis, clubbing, rash, edema. RUE PICC  Neuro: Alert & orientedx3, cranial nerves grossly intact. moves all 4 extremities w/o difficulty. Affect pleasant   Telemetry  ST 100s   EKG    N/A   Labs    CBC Recent Labs    06/05/22 0808 06/06/22 0406  WBC 10.1 10.4  HGB 7.7* 8.8*  HCT 22.9* 24.2*  MCV 84.5 86.1  PLT 147* [id number]   Basic Metabolic Panel Recent Labs    06/05/22 0432 06/06/22 0406  NA 127* 129*  K 3.3* 4.5  CL 89* 88*  CO2 24 24  GLUCOSE 111* 175*  BUN 25* 34*  CREATININE 2.42* 2.62*  CALCIUM 7.1* 7.5*  MG 1.7 2.1   Liver Function Tests Recent Labs    06/05/22 0432  06/06/22 0406  AST 255* 149*  ALT 111* 96*  ALKPHOS 211* 233*  BILITOT 1.6* 2.1*  PROT 5.9* 6.4*  ALBUMIN 2.0* 2.1*   No results for input(s): "LIPASE", "AMYLASE" in the last 72 hours. Cardiac Enzymes No results for input(s): "CKTOTAL", "CKMB", "CKMBINDEX", "TROPONINI" in the last 72 hours.  BNP: BNP (last 3 results) Recent Labs    04/05/22 1009 06/04/22 0400 06/05/22 0432  BNP 36.9 550.1* 339.5*    ProBNP (last 3 results) No results for input(s): "PROBNP" in the last 8760 hours.   D-Dimer No results for input(s): "DDIMER" in the last 72 hours. Hemoglobin A1C Recent Labs    06/03/22 1005  HGBA1C 11.1*   Fasting Lipid Panel Recent Labs    06/06/22 0406  TRIG 1,970*   Thyroid Function Tests No results for input(s): "TSH", "T4TOTAL", "T3FREE", "THYROIDAB" in the last 72 hours.  Invalid input(s): "FREET3"  Other results:   Imaging    No results found.   Medications:     Scheduled Medications:  sodium chloride   Intravenous Once   aspirin EC  81 mg Oral Daily   Chlorhexidine Gluconate Cloth  6 each Topical Daily   DULoxetine  30 mg Oral QHS   fenofibrate  160 mg Oral Daily   furosemide  80 mg  Intravenous BID   heparin injection (subcutaneous)  5,000 Units Subcutaneous Q8H   levothyroxine  125 mcg Oral Q0600   lipase/protease/amylase  36,000 Units Oral TID   loratadine  10 mg Oral Daily   pantoprazole  40 mg Oral Daily   rosuvastatin  40 mg Oral Daily   sodium bicarbonate  650 mg Oral BID   sodium chloride flush  10-40 mL Intracatheter Q12H   spironolactone  12.5 mg Oral Daily    Infusions:  insulin 0.1 Units/kg/hr (06/06/22 0700)   milrinone 0.25 mcg/kg/min (06/06/22 0700)    PRN Medications: acetaminophen, albuterol, dextrose, docusate sodium, guaiFENesin-dextromethorphan, HYDROmorphone (DILAUDID) injection, ondansetron (ZOFRAN) IV, mouth rinse, polyethylene glycol, prochlorperazine, sodium chloride flush    Patient Profile   Ms.  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 AHF team to see for volume management.    Assessment/Plan    Severe hyperTG - Due to LPL deficiency w/ recent pancreatitis. - Continue fenofibrate  - Previously offered referral to [facility] for further management. She prefers to continue with her endocrinologist at [facility] who currently manages/follows. - >5000 on admission, downtrending on insulin gtt, 1970 today  - per primary - CTA/P negative for pancreatitis or other acute findings   NICM, recovered EF, previously with HFrEF (EF 29%) - Echo (5/23): EF 45-50%, RV normal  - Echo 9/23: EF 50-55%, RV normal. mild MR, mild-mod TR -  Cardiomems, last read 3/2: 18 (PAD goal 17) - CVP 14-15. Continue IV lasix 80 mg twice a day .  - Check CO-OX. Continue milrinone 0.25 mcg. Wean once diuresed.  - Off coreg, sildenafil, and ivabradine for now. Once diuresed will reconsider.  - Off Jardiance due to frequent GU infections (+UTI this admission). Off Entresto with AKI. Off spiro w/ hyperkalemia    Hx CAD - Single vessel RCA occlusion (small co-dominant vessel) on cardiac cath 09/08. Medical management - Continue aspirin + statin. - No chest pain on exam    CKD w/ AKI -Scr baseline variable with multiple AKIs, typically 1.5-2.0 -Scr this admit 1.07> 2.6 today.  - Making > 5 liters urine.    5. Pulmonary hypertension - Mild to moderate on RHC 5/23. PVR 3.7 WU.   6. TR - Prominent v-waves in RA tracing on RHC 5/23. - Echo 5/23 shows only mild TR  - mild to mod on 9/23 echo   7. Type 3c DM, DKA - Follows w/ endocrinology at [facility]. - 2/2 chronic pancreatitis  - per primary   9. Chronic abdominal swelling/bloating - Seen at [facility] (11/20) for IBS, per chart review. - Had  penumatosis intestinalis. She was started on pancreatic enzyme replacement. - EGD (10/21): normal, bx taken to assess for celiac. - CT abdomen (2/22) and RUQ US showed no obvious gallbladder stones. - HIDA (4/22): ended early due to tachypnea, but cystic duct and gallbladder appeared WNL. - CT abdomen 12/22 unremarkable  - US abdomen 11/25/21: No ascites - suspect primarily fatty liver - Abd XR 12/27: mild gaseous distention    10. Poor Med Compliance - Followed by paramedicine OP.  Will need HF Paramedicine at discharge.    Consult PT.   Length of Stay: [place], NP  06/06/2022, 8:04 AM  Advanced Heart Failure Team Pager [phone] (M-F; 7a - 5p)  Please contact [facility] for night-coverage after hours (5p -7a ) and weekends on [email or web address]  Patient seen with NP, agree with the above note.   Stable today on milrinone 0.25.  She diuresed well over the last day, net negative -5769.  CVP remains 15-16.  Creatinine mildly higher at 2.42. She remains on Lasix 80 mg IV bid.   She remains on insulin gtt, TGs 1970 today.   Breathing better.   General: NAD Neck: JVP 14-16 cm, no thyromegaly or thyroid nodule.  Lungs: Clear to auscultation bilaterally with normal respiratory effort. CV: Nondisplaced PMI.  Heart regular S1/S2, no S3/S4, 2/6 HSM LLSB/apex.  No peripheral edema.    Abdomen: Soft, nontender, no hepatosplenomegaly, no distention.  Skin: Intact without lesions or rashes.  Neurologic: Alert and oriented x 3.  Psych: Normal affect. Extremities: No clubbing or cyanosis.  HEENT: Normal.   Familial hypertriglyceridemia due to lipoprotein lipase deficiency.  TGs markedly high at admission in setting of DKA.  TGs 1970 this morning.  - Continuing insulin gtt per hospitalist.   Echo this admission with EF 30-35% (lower than past), RV mildly dilated, moderate-severe MR, severe TR, dilated IVC.  She remains volume overloaded with CVP 15-16.  She diuresed well yesterday,  creatinine mildly higher.  - Continue milrinone 0.25 while diuresing.  Unable to follow co-ox due to markedly high TGs.  - Continue Lasix 80 mg IV bid.  - Continue spironolactone 12.5 daily, may need to stop if creatinine rises further.  - Would repeat echo after full diuresis to reassess MR and TR.   CRITICAL CARE Performed by: Loralie Champagne  Total critical care time: 40 minutes  Critical care time was exclusive of separately billable procedures and treating other patients.  Critical care was necessary to treat or prevent imminent or life-threatening deterioration.  Critical care was time spent personally by me on the following activities: development of treatment plan with patient and/or surrogate as well as nursing, discussions with consultants, evaluation of patient's response to treatment, examination of patient, obtaining history from patient or surrogate, ordering and performing treatments and interventions, ordering and review of laboratory studies, ordering and review of radiographic studies, pulse oximetry and re-evaluation of patient's condition.   Loralie Champagne 06/06/2022 8:36 AM

## 2022-06-06 NOTE — Progress Notes (Signed)
PROGRESS NOTE    Jennifer [name]  [id number] DOB: June 17, 1990 DOA: 05/29/2022 PCP: Elinor Parkinson   Brief Narrative:  32 year old woman who presented to [facility] ED with abdominal pain, nausea and vomiting. PMHx significant for HTN, HLD, CHF (Echo 11/2021 EF 50-55%, no RWMAs, mildly elevated PASP), pulmonary HTN (s/p CardioMEMS implantation, on Corlanor and sildenafil), T2DM (followed by [facility]), NAFLD, hypothyroidism, astrocytoma, PCOS.   On presentation to ED, patient was found to have an anion gap with acidosis consistent with DKA/hyperglycemia. Was also found to have a TG > 5000.  Concern for ongoing pancreatitis.  CT A/P 3/3 without acute process, no e/o pancreatitis; +hepatosplenomegaly. -Patient's anion gap has closed, she was transitioned off insulin drip, but her workup was noted for evolving anemia, suspicious for hemolytic anemia with elevated LFTs, and LDH, as well patient with significant difficulty with lab accuracy likely in the setting of her hypertriglyceridemia.  As well with significant volume overload, managed by advanced CHF team secondary to acute on chronic systolic CHF.  Assessment & Plan:   Principal Problem:   Hypertriglyceridemia Active Problems:   Acute renal failure superimposed on stage 3a chronic kidney disease (HCC)   History of astrocytoma of brain   Type 2 diabetes mellitus with hyperlipidemia (HCC)   Hypothyroidism   Chronic systolic CHF (congestive heart failure) (HCC)   Essential hypertension   Nonischemic cardiomyopathy (HCC)   Elevated liver enzymes   Pulmonary hypertension (HCC)   NASH (nonalcoholic steatohepatitis)   UTI (urinary tract infection) due to Enterococcus   E. coli UTI (urinary tract infection)   DKA (diabetic ketoacidosis) (HCC)  Diabetic ketoacidosis, type 2 diabetes with hyperglycemia AGMA secondary to above -she is on insulin U-500 at home, reports she has been compliant, reports she ran out of Dexcom refills,  so she was not checking her CBGs.. -Insulin GTT DKA protocol has been discontinued after anion gap closed.  On 3/5. -On insulin gtt due to hypertriglyceridemia protocol, now triglycerides improving but still elevated.(requiring around 1000 units of U-500 daily at home).   Acute on chronic endocarditis secondary to hypertriglyceridemia ?? multifactorial chylomicronemia syndrome -With known history of pancreatitis in the past due to a familial hypertriglyceridemia -[name] on home Repatha -Continue fenofibrate and statin. -started  on insulin gtt. for triglyceride> 5000, glyceride continues to improve, it is 170 today, will need to insulin drip until and unless triglycerides less than 500.  -Continue with Creon -Lipase within normal limit, no epigastric pain, no evidence of pancreatitis on imaging.     Acute on chronic diastolic/systolic CHF Anasarca cardiogenic shock Acute hypoxic respiratory failure -cardiology  assistance greatly appreciated, started on milrinone drip.  [name] comfortable on room air with no shortness of breath. -Diuresis per cardiology, back on Lasix 80 mg IV twice daily -Drop in EF to 35%, started on Aldactone   Anemia  -Due to anemia, with markedly elevated LDH, hematology input greatly appreciated, does not appear to be hemolysis . - -CT abdomen pelvis obtained, no evidence of retroperitoneal hematoma  -No evidence of B12 or iron deficiency   Hyperkalemia/hyponatremia -Significant electrolyte variation, due to inaccurate reading from her severe hypertriglyceridemia.  More accurate reading currently now her triglyceride has been controlled.  My lab natremia.   Pulmonary hypertension - Continue Corlanor,  - sildenafil hold per cardiology   AKI (likely in the setting of DKA, UTI) on baseline CKD stage 4 -Based on creatinine appears to be between 2.60 and has appeared to platelet for last few  days.  Avoid nephrotoxic agents as able. Ensure adequate renal perfusion.   Likely cardiorenal syndrome.  I suspect this is going to be her new baseline.   E. Coli and enterococcus UTI -She was treated with Rocephin initially, then transition to [place] given sensitivities, it was discontinued given questionable hemolytic anemia .   Transaminitis, has chronic hepatosplenomegaly History of hepatic steatosis/fatty liver ?? 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   Hypothyroid TSH 5.284, Free T4 0.74.  - Continue levothyroxine   Deconditioning - PT consulted   Nontypical chest pain -Nontypical, pleuritic, EKG nonacute, encouraged use incentive spirometer, negative troponins   Hyperkalemia Resolved.  DVT prophylaxis: heparin injection 5,000 Units Start: 06/05/22 1400 Place and maintain sequential compression device Start: 06/02/22 1114 SCDs Start: 05/29/22 1400   Code Status: Full Code  Family Communication:  None present at bedside.  Plan of care discussed with patient in length and he/she verbalized understanding and agreed with it.  Status is: Inpatient Remains inpatient appropriate because: [name] on insulin and milrinone drip.   Estimated body mass index is 30.82 kg/m as calculated from the following:   Height as of this encounter: '4\' 9"'$  (1.448 m).   Weight as of this encounter: 64.6 kg.    Nutritional Assessment: Body mass index is 30.82 kg/m.Marland Kitchen Seen by dietician.  I agree with the assessment and plan as outlined below: Nutrition Status:        . Skin Assessment: I have examined the patient's skin and I agree with the wound assessment as performed by the wound care RN as outlined below:    Consultants:  Cardiology  Procedures:  As above  Antimicrobials:  Anti-infectives (From admission, onward)     Start     Dose/Rate Route Frequency Ordered Stop   06/01/22 0000  nitrofurantoin, macrocrystal-monohydrate, (MACROBID) 100 MG capsule        100 mg Oral Every 12 hours 06/01/22 0936     05/31/22 1030  nitrofurantoin (macrocrystal-monohydrate) (MACROBID) capsule 100 mg  Status:  Discontinued        100 mg Oral Every 12 hours 05/31/22 0936 06/03/22 0717   05/30/22 1000  cefTRIAXone (ROCEPHIN) 1 g in sodium chloride 0.9 % 100 mL IVPB  Status:  Discontinued        1 g 200 mL/hr over 30 Minutes Intravenous Every 24 hours 05/30/22 0949 05/31/22 0936   05/29/22 1015  cefTRIAXone (ROCEPHIN) 1 g in sodium chloride 0.9 % 100 mL IVPB        1 g 200 mL/hr over 30 Minutes Intravenous  Once 05/29/22 1010 05/29/22 1324         Subjective: Patient seen and examined.  She has no cough.  [name] completely asymptomatic.  Objective: Vitals:   06/06/22 0745 06/06/22 0754 06/06/22 0800 06/06/22 0900  BP:   135/86   Pulse:  (!) 103 (!) 103 (!) 103  Resp:  '15 14 12  '$ Temp: 98.2 F (36.8 C)     TempSrc: Oral     SpO2:  98% 99% 98%  Weight:      Height:        Intake/Output Summary (Last 24 hours) at 06/06/2022 0902 Last data filed at 06/06/2022 0730 Gross per 24 hour  Intake 836.75 ml  Output 5850 ml  Net -5013.25 ml   Danley Danker  Weights   06/04/22 0500 06/05/22 0500 06/06/22 0500  Weight: 71.9 kg 68.4 kg 64.6 kg    Examination:  General exam: Appears calm and comfortable  Respiratory system: Clear to auscultation. Respiratory effort normal. Cardiovascular system: S1 & S2 heard, RRR. No JVD, murmurs, rubs, gallops or clicks. No pedal edema. Gastrointestinal system: Abdomen is nondistended, soft and nontender. No organomegaly or masses felt. Normal bowel sounds heard. Central nervous system: Alert and oriented. No focal neurological deficits. Extremities: Symmetric 5 x 5 power. Skin: No rashes, lesions or ulcers Psychiatry: Judgement and insight appear normal. Mood & affect appropriate.     Data Reviewed: I have personally reviewed following labs and imaging studies  CBC: Recent Labs  Lab 06/01/22 1822 06/02/22 0208 06/02/22 1009 06/03/22 0339 06/03/22 0705 06/03/22 0811 06/04/22 0400 06/04/22 0838 06/05/22 0808 06/06/22 0406  WBC 5.9 6.8  --  9.0  --   --  8.7  --  10.1 10.4  NEUTROABS 3.8  --   --   --   --   --   --   --   --   --   HGB 7.6* 8.3*   < > 7.2*   < > 8.5* 7.7* 8.2* 7.7* 8.8*  HCT 24.2* 24.4*   < > 24.0*   < > 25.0* 21.5* 24.0* 22.9* 24.2*  MCV 90.0 87.5  --  89.6  --   --  87.4  --  84.5 86.1  PLT 158 174  --  166  --   --  100*  --  147* 203   < > = values in this interval not displayed.   Basic Metabolic Panel: Recent Labs  Lab 05/30/22 1905 05/31/22 0155 06/01/22 1038 06/01/22 1548 06/02/22 0208 06/02/22 0620 06/03/22 0339 06/03/22 0705 06/04/22 0400 06/04/22 0838 06/04/22 1701 06/05/22 0432 06/06/22 0406 06/06/22 0652  NA 136 133* 130*   < > 128*   < > 129*   < > 129* 131* 127* 127* 129* 132*  K 3.1* 4.4 7.1*   < > SPECIMEN HEMOLYZED. HEMOLYSIS MAY AFFECT INTEGRITY OF RESULTS.   < > SPECIMEN HEMOLYZED. HEMOLYSIS MAY AFFECT INTEGRITY OF RESULTS.   < > 4.1 4.2 4.1 3.3* 4.5 4.3  CL 108 103 98   < > 96*   < > 98   < > 97* 99 89* 89* 88* 88*  CO2 13* 20* 21*   < > 19*   < > 19*  --  20*  --  20* '24 24 23  '$ GLUCOSE 199* 163* 242*   < > 167*   < > 127*   < > 78 65* 140* 111* 175* 108*  BUN '19 18 18   '$ < > 20   < > 21*   < > 23* 25* 23* 25* 34* 34*  CREATININE 1.22* 1.32* 1.59*   < > 1.63*   < > 1.90*   < > 2.41* 2.80* 2.36* 2.42* 2.62* 2.60*  CALCIUM 6.6* 7.8* 8.4*   < > 7.8*   < > 7.1*  --  7.1*  --  7.3* 7.1* 7.5* 7.7*  MG 1.9 2.2 1.9  --  2.0  --  1.8  --   --   --   --  1.7 2.1  --   PHOS 3.1 3.6 2.7  --  2.3*  --  3.4  --   --   --   --   --   --   --    < > =  values in this interval not displayed.   GFR: Estimated Creatinine Clearance: 24.3 mL/min (A) (by C-G formula based on SCr of 2.6 mg/dL (H)). Liver Function  Tests: Recent Labs  Lab 06/03/22 0339 06/04/22 0400 06/05/22 0432 06/06/22 0406 06/06/22 0652  AST 142* 290* 255* 149* 132*  ALT 76* 123* 111* 96* 88*  ALKPHOS 164* 167* 211* 233* 230*  BILITOT 3.9* 2.5* 1.6* 2.1* 2.2*  PROT 5.5* 5.6* 5.9* 6.4* 6.4*  ALBUMIN 2.2* 2.1* 2.0* 2.1* 2.1*   Recent Labs  Lab 06/01/22 1822  LIPASE 26   No results for input(s): "AMMONIA" in the last 168 hours. Coagulation Profile: No results for input(s): "INR", "PROTIME" in the last 168 hours. Cardiac Enzymes: No results for input(s): "CKTOTAL", "CKMB", "CKMBINDEX", "TROPONINI" in the last 168 hours. BNP (last 3 results) No results for input(s): "PROBNP" in the last 8760 hours. HbA1C: Recent Labs    06/03/22 1005  HGBA1C 11.1*   CBG: Recent Labs  Lab 06/05/22 2327 06/06/22 0236 06/06/22 0359 06/06/22 0648 06/06/22 0808  GLUCAP 255* 212* 179* 99 99   Lipid Profile: Recent Labs    06/05/22 0432 06/06/22 0406  TRIG 1,218* 1,970*   Thyroid Function Tests: No results for input(s): "TSH", "T4TOTAL", "FREET4", "T3FREE", "THYROIDAB" in the last 72 hours. Anemia Panel: No results for input(s): "VITAMINB12", "FOLATE", "FERRITIN", "TIBC", "IRON", "RETICCTPCT" in the last 72 hours. Sepsis Labs: Recent Labs  Lab 06/04/22 0948 06/04/22 1255  LATICACIDVEN 1.2 1.4    Recent Results (from the past 240 hour(s))  Urine Culture     Status: Abnormal   Collection Time: 05/29/22  9:19 AM   Specimen: Urine, Random  Result Value Ref Range Status   Specimen Description   Final    URINE, RANDOM Performed at [facility], [address] [place], [place] [address]    Special Requests   Final    NONE Reflexed from [id number] Performed at [facility], [address] [place], [place] [address]    Culture (A)  Final    >=100,000 COLONIES/mL ESCHERICHIA COLI Confirmed Extended Spectrum Beta-Lactamase Producer (ESBL).  In bloodstream infections from ESBL  organisms, carbapenems are preferred over piperacillin/tazobactam. They are shown to have a lower risk of mortality. 80,000 COLONIES/mL ENTEROCOCCUS FAECALIS    Report Status 05/31/2022 FINAL  Final   Organism ID, Bacteria ESCHERICHIA COLI (A)  Final   Organism ID, Bacteria ENTEROCOCCUS FAECALIS (A)  Final      Susceptibility   Escherichia coli - MIC*    AMPICILLIN >=32 RESISTANT Resistant     CEFAZOLIN >=64 RESISTANT Resistant     CEFEPIME 2 SENSITIVE Sensitive     CEFTRIAXONE >=64 RESISTANT Resistant     CIPROFLOXACIN >=4 RESISTANT Resistant     GENTAMICIN <=1 SENSITIVE Sensitive     IMIPENEM <=0.25 SENSITIVE Sensitive     NITROFURANTOIN <=16 SENSITIVE Sensitive     TRIMETH/SULFA >=320 RESISTANT Resistant     AMPICILLIN/SULBACTAM 16 INTERMEDIATE Intermediate     PIP/TAZO <=4 SENSITIVE Sensitive     * >=100,000 COLONIES/mL ESCHERICHIA COLI   Enterococcus faecalis - MIC*    AMPICILLIN <=2 SENSITIVE Sensitive     NITROFURANTOIN <=16 SENSITIVE Sensitive     VANCOMYCIN 1 SENSITIVE Sensitive     * 80,000 COLONIES/mL ENTEROCOCCUS FAECALIS  MRSA Next Gen by PCR, Nasal     Status: None   Collection Time: 05/29/22  1:09 PM   Specimen: Nasal Mucosa; Nasal Swab  Result Value Ref Range Status   MRSA  by PCR Next Gen NOT DETECTED NOT DETECTED Final    Comment: (NOTE) The GeneXpert MRSA Assay (FDA approved for NASAL specimens only), is one component of a comprehensive MRSA colonization surveillance program. It is not intended to diagnose MRSA infection nor to guide or monitor treatment for MRSA infections. Test performance is not FDA approved in patients less than 46 years old. Performed at [facility], [place] [address], [place], [place] [address]      Radiology Studies: ECHOCARDIOGRAM COMPLETE  Result Date: 06/04/2022    ECHOCARDIOGRAM REPORT   Patient Name:   TANINA RAKOCZY Date of Exam: 06/04/2022 Medical Rec #:  [id number]         Height:       57.0 in Accession #:    [id number]         Weight:       158.5 lb Date of Birth:  May 18, 1990         BSA:          1.629 m Patient Age:    32 years          BP:           112/75 mmHg Patient Gender: F                 HR:           66 bpm. Exam Location:  Inpatient Procedure: 2D Echo, Color Doppler, Cardiac Doppler and Intracardiac            Opacification Agent STAT ECHO                                 MODIFIED REPORT:   This report was modified by Rudean Haskell MD on 06/04/2022 due to [place].  Indications:     [id number] Acute systolic (congestive) heart failure  History:         Patient has prior history of Echocardiogram examinations, most                  recent 12/03/2021. CHF; Risk Factors:Hypertension, Diabetes and                  Dyslipidemia.  Sonographer:     Raquel Sarna Senior RDCS Referring Phys:  [id number] [place] Diagnosing Phys: Rudean Haskell MD IMPRESSIONS  1. Left ventricular ejection fraction, by estimation, is 30 to 35%. The left ventricle has moderately decreased function. Left ventricular endocardial border not optimally defined to evaluate regional wall motion. Left ventricular diastolic parameters are indeterminate.  2. Right ventricular systolic function is normal. The right ventricular size is is dilated. There is mildly elevated pulmonary artery systolic pressure. The estimated right ventricular systolic pressure is Q000111Q mmHg.  3. A small pericardial effusion is present. The pericardial effusion is posterior to the left ventricle.  4. The mitral valve is normal in structure. Moderate to severe mitral valve regurgitation.  5. TV best assessed in image 17. Tricuspid valve regurgitation is severe.  6. The aortic valve was not well visualized. Aortic valve regurgitation is mild.  7. Pulmonic valve regurgitation is moderate.  8. The inferior vena cava is dilated in size with <50% respiratory variability, suggesting right atrial pressure of 15 mmHg. Comparison(s):  LV function  is worse, mitral regurgitation is worse, tricuspid regurgitation is worse. Conclusion(s)/Recommendation(s): Reviewed with primary team. FINDINGS  Left Ventricle: Left ventricular ejection fraction, by estimation, is 30 to 35%. The left ventricle has moderately decreased function. Left ventricular endocardial border not optimally defined to evaluate regional wall motion. Definity contrast agent was given IV to delineate the left ventricular endocardial borders. The left ventricular internal cavity size was normal in size. There is no left ventricular hypertrophy. Left ventricular diastolic parameters are indeterminate. Right Ventricle: The right ventricular size is is dilated. Right vetricular wall thickness was not well visualized. Right ventricular systolic function is normal. There is mildly elevated pulmonary artery systolic pressure. The tricuspid regurgitant velocity is 2.35 m/s, and with an assumed right atrial pressure of 15 mmHg, the estimated right ventricular systolic pressure is Q000111Q mmHg. Left Atrium: Left atrial size was normal in size. Right Atrium: Right atrial size was normal in size. Pericardium: A small pericardial effusion is present. The pericardial effusion is posterior to the left ventricle. Mitral Valve: The mitral valve is normal in structure. Moderate to severe mitral valve regurgitation. Tricuspid Valve: TV best assessed in image 17. The tricuspid valve is normal in structure. Tricuspid valve regurgitation is severe. Aortic Valve: The aortic valve was not well visualized. Aortic valve regurgitation is mild. Pulmonic Valve: The pulmonic valve was normal in structure. Pulmonic valve regurgitation is moderate. Aorta: The aortic root and ascending aorta are structurally normal, with no evidence of dilitation. Venous: The inferior vena cava is dilated in size with less than 50% respiratory variability, suggesting right atrial pressure of 15 mmHg. IAS/Shunts: No atrial level shunt detected by  color flow Doppler.  LEFT VENTRICLE PLAX 2D LVIDd:         4.20 cm     Diastology LVIDs:         3.05 cm     LV e' medial:    7.18 cm/s LV PW:         1.10 cm     LV E/e' medial:  15.3 LV IVS:        0.90 cm     LV e' lateral:   11.30 cm/s LVOT diam:     1.80 cm     LV E/e' lateral: 9.7 LV SV:         44 LV SV Index:   27 LVOT Area:     2.54 cm  LV Volumes (MOD) LV vol d, MOD A4C: 83.1 ml LV vol s, MOD A4C: 52.4 ml LV SV MOD A4C:     83.1 ml RIGHT VENTRICLE RV S prime:     12.70 cm/s TAPSE (M-mode): 2.4 cm LEFT ATRIUM             Index        RIGHT ATRIUM           Index LA diam:        3.30 cm 2.03 cm/m   RA Area:     12.30 cm LA Vol (A2C):   39.7 ml 24.36 ml/m  RA Volume:   28.20 ml  17.31 ml/m LA Vol (A4C):   37.2 ml 22.83 ml/m LA Biplane Vol: 38.4 ml 23.57 ml/m  AORTIC VALVE LVOT Vmax:   97.90 cm/s LVOT Vmean:  69.900 cm/s LVOT VTI:    0.173 m  AORTA Ao Root diam: 2.20 cm Ao Asc diam:  3.00 cm MITRAL VALVE  TRICUSPID VALVE MV Area (PHT): 4.77 cm     TR Peak grad:   22.1 mmHg MV Decel Time: 159 msec     TR Vmax:        235.00 cm/s MV E velocity: 110.00 cm/s MV A velocity: 65.70 cm/s   SHUNTS MV E/A ratio:  1.67         Systemic VTI:  0.17 m                             Systemic Diam: 1.80 cm Rudean Haskell MD Electronically signed by Rudean Haskell MD Signature Date/Time: 06/04/2022/2:16:27 PM    Final (Updated)     Scheduled Meds:  sodium chloride   Intravenous Once   aspirin EC  81 mg Oral Daily   Chlorhexidine Gluconate Cloth  6 each Topical Daily   DULoxetine  30 mg Oral QHS   fenofibrate  160 mg Oral Daily   furosemide  80 mg Intravenous BID   heparin injection (subcutaneous)  5,000 Units Subcutaneous Q8H   levothyroxine  125 mcg Oral Q0600   lipase/protease/amylase  36,000 Units Oral TID   loratadine  10 mg Oral Daily   pantoprazole  40 mg Oral Daily   rosuvastatin  40 mg Oral Daily   sodium bicarbonate  650 mg Oral BID   sodium chloride flush  10-40 mL  Intracatheter Q12H   spironolactone  12.5 mg Oral Daily   Continuous Infusions:  insulin 0.1 Units/kg/hr (06/06/22 0700)   milrinone 0.25 mcg/kg/min (06/06/22 0700)     LOS: 8 days   Darliss Cheney, MD [facility]  06/06/2022, 9:02 AM   *Please note that this is a verbal dictation therefore any spelling or grammatical errors are due to the "[employer]" system interpretation.  Please page via [place] and do not message via secure chat for urgent patient care matters. Secure chat can be used for non urgent patient care matters.  How to contact the [facility] Attending or Consulting provider [place] or covering provider during after hours [place], for this patient?  Check the care team in [facility] and look for a) attending/consulting TR[facility] provider listed and b) the [facility] team listed. Page or secure chat [phone]. Log into [email or web address] and use [place]'s universal password to access. If you do not have the password, please contact the hospital operator. Locate the [facility] provider you are looking for under [facility] and page to a number that you can be directly reached. If you still have difficulty reaching the provider, please page the [facility] (Director on Call) for the Hospitalists listed on amion for assistance.

## 2022-06-07 DIAGNOSIS — I5043 Acute on chronic combined systolic (congestive) and diastolic (congestive) heart failure: Secondary | ICD-10-CM | POA: Diagnosis not present

## 2022-06-07 DIAGNOSIS — E781 Pure hyperglyceridemia: Secondary | ICD-10-CM | POA: Diagnosis not present

## 2022-06-07 LAB — COMPREHENSIVE METABOLIC PANEL
ALT: 77 U/L — ABNORMAL HIGH (ref 0–44)
AST: 116 U/L — ABNORMAL HIGH (ref 15–41)
Albumin: 2.3 g/dL — ABNORMAL LOW (ref 3.5–5.0)
Alkaline Phosphatase: 221 U/L — ABNORMAL HIGH (ref 38–126)
Anion gap: 19 — ABNORMAL HIGH (ref 5–15)
BUN: 41 mg/dL — ABNORMAL HIGH (ref 6–20)
CO2: 26 mmol/L (ref 22–32)
Calcium: 8 mg/dL — ABNORMAL LOW (ref 8.9–10.3)
Chloride: 85 mmol/L — ABNORMAL LOW (ref 98–111)
Creatinine, Ser: 2.26 mg/dL — ABNORMAL HIGH (ref 0.44–1.00)
GFR, Estimated: 29 mL/min — ABNORMAL LOW (ref >60)
Glucose, Bld: 127 mg/dL — ABNORMAL HIGH (ref 70–99)
Potassium: 4.6 mmol/L (ref 3.5–5.1)
Sodium: 130 mmol/L — ABNORMAL LOW (ref 135–145)
Total Bilirubin: 2.9 mg/dL — ABNORMAL HIGH (ref 0.3–1.2)
Total Protein: 7 g/dL (ref 6.5–8.1)

## 2022-06-07 LAB — COOXEMETRY PANEL
Carboxyhemoglobin: 0.3 % — ABNORMAL LOW (ref 0.5–1.5)
Methemoglobin: <0.7 % (ref 0.0–1.5)
O2 Saturation: 73.7 %
Total hemoglobin: 8.5 g/dL — ABNORMAL LOW (ref 12.0–16.0)

## 2022-06-07 LAB — CBC
HCT: 25 % — ABNORMAL LOW (ref 36.0–46.0)
Hemoglobin: 9.4 g/dL — ABNORMAL LOW (ref 12.0–15.0)
MCH: 31.6 pg (ref 26.0–34.0)
MCHC: 36.7 g/dL — ABNORMAL HIGH (ref 30.0–36.0)
MCV: 84.7 fL (ref 80.0–100.0)
Platelets: 236 10*3/uL (ref 150–400)
RBC: 2.95 MIL/uL — ABNORMAL LOW (ref 3.87–5.11)
RDW: 17.3 % — ABNORMAL HIGH (ref 11.5–15.5)
WBC: 11.9 10*3/uL — ABNORMAL HIGH (ref 4.0–10.5)
nRBC: 0.3 % — ABNORMAL HIGH (ref 0.0–0.2)

## 2022-06-07 LAB — GLUCOSE, CAPILLARY
Glucose-Capillary: 101 mg/dL — ABNORMAL HIGH (ref 70–99)
Glucose-Capillary: 108 mg/dL — ABNORMAL HIGH (ref 70–99)
Glucose-Capillary: 108 mg/dL — ABNORMAL HIGH (ref 70–99)
Glucose-Capillary: 120 mg/dL — ABNORMAL HIGH (ref 70–99)
Glucose-Capillary: 122 mg/dL — ABNORMAL HIGH (ref 70–99)
Glucose-Capillary: 125 mg/dL — ABNORMAL HIGH (ref 70–99)
Glucose-Capillary: 131 mg/dL — ABNORMAL HIGH (ref 70–99)
Glucose-Capillary: 150 mg/dL — ABNORMAL HIGH (ref 70–99)
Glucose-Capillary: 170 mg/dL — ABNORMAL HIGH (ref 70–99)
Glucose-Capillary: 201 mg/dL — ABNORMAL HIGH (ref 70–99)
Glucose-Capillary: 224 mg/dL — ABNORMAL HIGH (ref 70–99)
Glucose-Capillary: 242 mg/dL — ABNORMAL HIGH (ref 70–99)
Glucose-Capillary: 257 mg/dL — ABNORMAL HIGH (ref 70–99)

## 2022-06-07 LAB — MAGNESIUM: Magnesium: 1.9 mg/dL (ref 1.7–2.4)

## 2022-06-07 LAB — TRIGLYCERIDES: Triglycerides: 2168 mg/dL — ABNORMAL HIGH (ref <150)

## 2022-06-07 MED ORDER — SPIRONOLACTONE 25 MG PO TABS
25.0000 mg | ORAL_TABLET | Freq: Every day | ORAL | Status: DC
  Administered 2022-06-07 – 2022-06-12 (×6): 25 mg via ORAL
  Filled 2022-06-07 (×6): qty 1

## 2022-06-07 MED ORDER — MAGNESIUM SULFATE IN D5W 1-5 GM/100ML-% IV SOLN
1.0000 g | Freq: Once | INTRAVENOUS | Status: AC
  Administered 2022-06-07: 1 g via INTRAVENOUS
  Filled 2022-06-07: qty 100

## 2022-06-07 NOTE — Progress Notes (Signed)
PROGRESS NOTE    Jennifer [name]  [id number] DOB: Jun 03, 1990 DOA: 05/29/2022 PCP: Elinor Parkinson   Brief Narrative:  32 year old woman who presented to [facility] ED with abdominal pain, nausea and vomiting. PMHx significant for HTN, HLD, CHF (Echo 11/2021 EF 50-55%, no RWMAs, mildly elevated PASP), pulmonary HTN (s/p CardioMEMS implantation, on Corlanor and sildenafil), T2DM (followed by [facility]), NAFLD, hypothyroidism, astrocytoma, PCOS.   On presentation to ED, patient was found to have an anion gap with acidosis consistent with DKA/hyperglycemia. Was also found to have a TG > 5000.  Concern for ongoing pancreatitis.  CT A/P 3/3 without acute process, no e/o pancreatitis; +hepatosplenomegaly. -Patient's anion gap has closed, she was transitioned off insulin drip, but her workup was noted for evolving anemia, suspicious for hemolytic anemia with elevated LFTs, and LDH, as well patient with significant difficulty with lab accuracy likely in the setting of her hypertriglyceridemia.  As well with significant volume overload, managed by advanced CHF team secondary to acute on chronic systolic CHF.  Assessment & Plan:   Principal Problem:   Hypertriglyceridemia Active Problems:   Acute renal failure superimposed on stage 3a chronic kidney disease (HCC)   History of astrocytoma of brain   Type 2 diabetes mellitus with hyperlipidemia (HCC)   Hypothyroidism   Chronic systolic CHF (congestive heart failure) (HCC)   Essential hypertension   Nonischemic cardiomyopathy (HCC)   Elevated liver enzymes   Pulmonary hypertension (HCC)   NASH (nonalcoholic steatohepatitis)   UTI (urinary tract infection) due to Enterococcus   E. coli UTI (urinary tract infection)   DKA (diabetic ketoacidosis) (HCC)  Diabetic ketoacidosis, type 2 diabetes with hyperglycemia AGMA secondary to above -she is on insulin U-500 at home, reports she has been compliant, reports she ran out of Dexcom refills,  so she was not checking her CBGs.. -Insulin GTT DKA protocol has been discontinued after anion gap closed.  On 3/5. -On insulin gtt due to hypertriglyceridemia protocol, .(requiring around 1000 units of U-500 daily at home).   Acute on chronic back neuritis secondary to hypertriglyceridemia ?? multifactorial chylomicronemia syndrome -With known history of pancreatitis in the past due to a familial hypertriglyceridemia -[name] on home Repatha -Continue fenofibrate and statin. -started  on insulin gtt. for triglyceride> 5000, glyceride improved but then got worse.  Still over 2000.  Clinically asymptomatic.  Will need to insulin drip until and unless triglycerides less than 500.  -Continue with Creon -Lipase within normal limit, no epigastric pain, no evidence of pancreatitis on imaging.     Acute on chronic diastolic/systolic CHF Anasarca cardiogenic shock Acute hypoxic respiratory failure -cardiology  assistance greatly appreciated, started on milrinone drip.  [name] comfortable on room air with no shortness of breath. -Diuresis per cardiology, back on Lasix 80 mg IV twice daily -Drop in EF to 35%, started on Aldactone   Anemia  -Due to anemia, with markedly elevated LDH, hematology input greatly appreciated, does not appear to be hemolysis . - -CT abdomen pelvis obtained, no evidence of retroperitoneal hematoma  -No evidence of B12 or iron deficiency   Hyperkalemia/hyponatremia -Significant electrolyte variation, due to inaccurate reading from her severe hypertriglyceridemia.  More accurate reading currently now her triglyceride has been controlled.  Mild hyponatremia.  No hypokalemia anymore.   Pulmonary hypertension - Continue Corlanor,  - sildenafil hold per cardiology   AKI (likely in the setting of DKA, UTI) on baseline CKD stage 4 -Baseline creatinine appears to be between 1.6-1.8, peaked at 2.6 and  is improving and down to 2.26 today. Avoid nephrotoxic agents as able.  Ensure adequate renal perfusion.  Likely cardiorenal syndrome.     E. Coli and enterococcus UTI -She was treated with Rocephin initially, then transition to [place] given sensitivities, it was discontinued given questionable hemolytic anemia .   Transaminitis, has chronic hepatosplenomegaly History of hepatic steatosis/fatty liver ?? 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   Hypothyroid TSH 5.284, Free T4 0.74.  - Continue levothyroxine   Deconditioning - PT consulted   Nontypical chest pain -Nontypical, pleuritic, EKG nonacute, encouraged use incentive spirometer, negative troponins   Hyperkalemia Resolved.  DVT prophylaxis: heparin injection 5,000 Units Start: 06/05/22 1400 Place and maintain sequential compression device Start: 06/02/22 1114 SCDs Start: 05/29/22 1400   Code Status: Full Code  Family Communication:  None present at bedside.  Plan of care discussed with patient in length and he/she verbalized understanding and agreed with it.  Status is: Inpatient Remains inpatient appropriate because: [name] on insulin and milrinone drip.   Estimated body mass index is 29.29 kg/m as calculated from the following:   Height as of this encounter: '4\' 9"'$  (1.448 m).   Weight as of this encounter: 61.4 kg.    Nutritional Assessment: Body mass index is 29.29 kg/m.Marland Kitchen Seen by dietician.  I agree with the assessment and plan as outlined below: Nutrition Status:        . Skin Assessment: I have examined the patient's skin and I agree with the wound assessment as performed by the wound care RN as outlined below:    Consultants:  Cardiology  Procedures:  As above  Antimicrobials:  Anti-infectives (From admission, onward)    Start      Dose/Rate Route Frequency Ordered Stop   06/01/22 0000  nitrofurantoin, macrocrystal-monohydrate, (MACROBID) 100 MG capsule        100 mg Oral Every 12 hours 06/01/22 0936     05/31/22 1030  nitrofurantoin (macrocrystal-monohydrate) (MACROBID) capsule 100 mg  Status:  Discontinued        100 mg Oral Every 12 hours 05/31/22 0936 06/03/22 0717   05/30/22 1000  cefTRIAXone (ROCEPHIN) 1 g in sodium chloride 0.9 % 100 mL IVPB  Status:  Discontinued        1 g 200 mL/hr over 30 Minutes Intravenous Every 24 hours 05/30/22 0949 05/31/22 0936   05/29/22 1015  cefTRIAXone (ROCEPHIN) 1 g in sodium chloride 0.9 % 100 mL IVPB        1 g 200 mL/hr over 30 Minutes Intravenous  Once 05/29/22 1010 05/29/22 1324         Subjective: Patient seen and examined.  She has no complaints.  Objective: Vitals:   06/07/22 0500 06/07/22 0600 06/07/22 0700 06/07/22 0800  BP: (!) 118/59  (!) 140/93   Pulse: (!) 117  (!) 126   Resp: '14 19 19   '$ Temp:    98.2 F (36.8 C)  TempSrc:    Oral  SpO2: 95%  96%   Weight: 61.4 kg     Height:        Intake/Output Summary (Last 24 hours) at 06/07/2022 1022 Last data filed at 06/07/2022 0800 Gross per 24 hour  Intake 385.59 ml  Output 4700 ml  Net -4314.41 ml    Filed Weights   06/05/22 0500 06/06/22  0500 06/07/22 0500  Weight: 68.4 kg 64.6 kg 61.4 kg    Examination:  General exam: Appears calm and comfortable  Respiratory system: Clear to auscultation. Respiratory effort normal. Cardiovascular system: S1 & S2 heard, RRR. No JVD, murmurs, rubs, gallops or clicks. No pedal edema. Gastrointestinal system: Abdomen is nondistended, soft and nontender. No organomegaly or masses felt. Normal bowel sounds heard. Central nervous system: Alert and oriented. No focal neurological deficits. Extremities: Symmetric 5 x 5 power. Skin: No rashes, lesions or ulcers.  Psychiatry: Judgement and insight appear normal. Mood & affect appropriate.   Data Reviewed: I have  personally reviewed following labs and imaging studies  CBC: Recent Labs  Lab 06/01/22 1822 06/02/22 0208 06/03/22 0339 06/03/22 0705 06/04/22 0400 06/04/22 0838 06/05/22 0808 06/06/22 0406 06/07/22 0416  WBC 5.9   < > 9.0  --  8.7  --  10.1 10.4 11.9*  NEUTROABS 3.8  --   --   --   --   --   --   --   --   HGB 7.6*   < > 7.2*   < > 7.7* 8.2* 7.7* 8.8* 9.4*  HCT 24.2*   < > 24.0*   < > 21.5* 24.0* 22.9* 24.2* 25.0*  MCV 90.0   < > 89.6  --  87.4  --  84.5 86.1 84.7  PLT 158   < > 166  --  100*  --  147* 203 236   < > = values in this interval not displayed.    Basic Metabolic Panel: Recent Labs  Lab 06/01/22 1038 06/01/22 1548 06/02/22 0208 06/02/22 0620 06/03/22 0339 06/03/22 0705 06/04/22 1701 06/05/22 0432 06/06/22 0406 06/06/22 0652 06/07/22 0416  NA 130*   < > 128*   < > 129*   < > 127* 127* 129* 132* 130*  K 7.1*   < > SPECIMEN HEMOLYZED. HEMOLYSIS MAY AFFECT INTEGRITY OF RESULTS.   < > SPECIMEN HEMOLYZED. HEMOLYSIS MAY AFFECT INTEGRITY OF RESULTS.   < > 4.1 3.3* 4.5 4.3 4.6  CL 98   < > 96*   < > 98   < > 89* 89* 88* 88* 85*  CO2 21*   < > 19*   < > 19*   < > 20* '24 24 23 26  '$ GLUCOSE 242*   < > 167*   < > 127*   < > 140* 111* 175* 108* 127*  BUN 18   < > 20   < > 21*   < > 23* 25* 34* 34* 41*  CREATININE 1.59*   < > 1.63*   < > 1.90*   < > 2.36* 2.42* 2.62* 2.60* 2.26*  CALCIUM 8.4*   < > 7.8*   < > 7.1*   < > 7.3* 7.1* 7.5* 7.7* 8.0*  MG 1.9  --  2.0  --  1.8  --   --  1.7 2.1  --  1.9  PHOS 2.7  --  2.3*  --  3.4  --   --   --   --   --   --    < > = values in this interval not displayed.    GFR: Estimated Creatinine Clearance: 27.2 mL/min (A) (by C-G formula based on SCr of 2.26 mg/dL (H)). Liver Function Tests: Recent Labs  Lab 06/04/22 0400 06/05/22 0432 06/06/22 0406 06/06/22 0652 06/07/22 0416  AST 290* 255* 149* 132* 116*  ALT 123* 111* 96* 88* 77*  ALKPHOS  167* 211* 233* 230* 221*  BILITOT 2.5* 1.6* 2.1* 2.2* 2.9*  PROT 5.6* 5.9* 6.4*  6.4* 7.0  ALBUMIN 2.1* 2.0* 2.1* 2.1* 2.3*    Recent Labs  Lab 06/01/22 1822  LIPASE 26    No results for input(s): "AMMONIA" in the last 168 hours. Coagulation Profile: No results for input(s): "INR", "PROTIME" in the last 168 hours. Cardiac Enzymes: No results for input(s): "CKTOTAL", "CKMB", "CKMBINDEX", "TROPONINI" in the last 168 hours. BNP (last 3 results) No results for input(s): "PROBNP" in the last 8760 hours. HbA1C: No results for input(s): "HGBA1C" in the last 72 hours.  CBG: Recent Labs  Lab 06/07/22 0010 06/07/22 0118 06/07/22 0249 06/07/22 0618 06/07/22 0916  GLUCAP 108* 108* 125* 131* 242*    Lipid Profile: Recent Labs    06/06/22 2030 06/07/22 0416  TRIG 2,747* 2,168*    Thyroid Function Tests: No results for input(s): "TSH", "T4TOTAL", "FREET4", "T3FREE", "THYROIDAB" in the last 72 hours. Anemia Panel: No results for input(s): "VITAMINB12", "FOLATE", "FERRITIN", "TIBC", "IRON", "RETICCTPCT" in the last 72 hours. Sepsis Labs: Recent Labs  Lab 06/04/22 0948 06/04/22 1255  LATICACIDVEN 1.2 1.4     Recent Results (from the past 240 hour(s))  Urine Culture     Status: Abnormal   Collection Time: 05/29/22  9:19 AM   Specimen: Urine, Random  Result Value Ref Range Status   Specimen Description   Final    URINE, RANDOM Performed at [facility], [address], [place], [place] [address]    Special Requests   Final    NONE Reflexed from [id number] Performed at [facility], [address] [place], [place] [address]    Culture (A)  Final    >=100,000 COLONIES/mL ESCHERICHIA COLI Confirmed Extended Spectrum Beta-Lactamase Producer (ESBL).  In bloodstream infections from ESBL organisms, carbapenems are preferred over piperacillin/tazobactam. They are shown to have a lower risk of mortality. 80,000 COLONIES/mL ENTEROCOCCUS FAECALIS    Report Status 05/31/2022 FINAL  Final   Organism ID, Bacteria  ESCHERICHIA COLI (A)  Final   Organism ID, Bacteria ENTEROCOCCUS FAECALIS (A)  Final      Susceptibility   Escherichia coli - MIC*    AMPICILLIN >=32 RESISTANT Resistant     CEFAZOLIN >=64 RESISTANT Resistant     CEFEPIME 2 SENSITIVE Sensitive     CEFTRIAXONE >=64 RESISTANT Resistant     CIPROFLOXACIN >=4 RESISTANT Resistant     GENTAMICIN <=1 SENSITIVE Sensitive     IMIPENEM <=0.25 SENSITIVE Sensitive     NITROFURANTOIN <=16 SENSITIVE Sensitive     TRIMETH/SULFA >=320 RESISTANT Resistant     AMPICILLIN/SULBACTAM 16 INTERMEDIATE Intermediate     PIP/TAZO <=4 SENSITIVE Sensitive     * >=100,000 COLONIES/mL ESCHERICHIA COLI   Enterococcus faecalis - MIC*    AMPICILLIN <=2 SENSITIVE Sensitive     NITROFURANTOIN <=16 SENSITIVE Sensitive     VANCOMYCIN 1 SENSITIVE Sensitive     * 80,000 COLONIES/mL ENTEROCOCCUS FAECALIS  MRSA Next Gen by PCR, Nasal     Status: None   Collection Time: 05/29/22  1:09 PM   Specimen: Nasal Mucosa; Nasal Swab  Result Value Ref Range Status   MRSA by PCR Next Gen NOT DETECTED NOT DETECTED Final    Comment: (NOTE) The GeneXpert MRSA Assay (FDA approved for NASAL specimens only), is one component of a comprehensive MRSA colonization surveillance program. It is not intended to diagnose MRSA infection nor to guide or monitor treatment for MRSA infections. Test performance is  not FDA approved in patients less than 92 years old. Performed at [facility], [place] [address], [place], [place] [address]      Radiology Studies: No results found.  Scheduled Meds:  sodium chloride   Intravenous Once   aspirin EC  81 mg Oral Daily   Chlorhexidine Gluconate Cloth  6 each Topical Daily   DULoxetine  30 mg Oral QHS   fenofibrate  160 mg Oral Daily   furosemide  80 mg Intravenous BID   heparin injection (subcutaneous)  5,000 Units Subcutaneous Q8H   levothyroxine  125 mcg Oral Q0600   lipase/protease/amylase  36,000 Units Oral TID   loratadine  10 mg Oral  Daily   pantoprazole  40 mg Oral Daily   rosuvastatin  40 mg Oral Daily   sodium bicarbonate  650 mg Oral BID   sodium chloride flush  10-40 mL Intracatheter Q12H   spironolactone  25 mg Oral Daily   Continuous Infusions:  insulin 0.1 Units/kg/hr (06/07/22 0800)   milrinone 0.25 mcg/kg/min (06/07/22 0800)     LOS: 9 days   Darliss Cheney, MD [facility]  06/07/2022, 10:22 AM   *Please note that this is a verbal dictation therefore any spelling or grammatical errors are due to the "[place] [employer]" system interpretation.  Please page via [place] and do not message via secure chat for urgent patient care matters. Secure chat can be used for non urgent patient care matters.  How to contact the [facility] Attending or Consulting provider [place] or covering provider during after hours [place], for this patient?  Check the care team in [facility] and look for a) attending/consulting [facility] provider listed and b) the [facility] team listed. Page or secure chat [phone]. Log into [email or web address] and use [place]'s universal password to access. If you do not have the password, please contact the hospital operator. Locate the [facility] provider you are looking for under [facility] and page to a number that you can be directly reached. If you still have difficulty reaching the provider, please page the [facility] (Director on Call) for the Hospitalists listed on amion for assistance.

## 2022-06-07 NOTE — Progress Notes (Signed)
Physical Therapy Treatment Patient Details Name: Jennifer [name] MRN: [id number] DOB: [birth year] Today's Date: 06/07/2022   History of Present Illness 32 yo female admitted 3/3 with abdominal pain and n/v. Pt with DKA and pancreatitis. PMH: T2DM, astrocytoma, lipoprotein deficiency, pancreatitis, HLD, pulmonary hypertension, CHF, fatty liver    PT Comments    Pt pleasant and able to progress gait distance on 1L and increased gait speed. Pt with decreased veering and standing rest breaks during mobility and educated for HEP, progression and IS use. Pt able to achieve 750 on IS this session with encouragement for continued use. Will continue to follow.   HR 120-130 with gait    Recommendations for follow up therapy are one component of a multi-disciplinary discharge planning process, led by the attending physician.  Recommendations may be updated based on patient status, additional functional criteria and insurance authorization.  Follow Up Recommendations  No PT follow up     Assistance Recommended at Discharge Intermittent Supervision/Assistance  Patient can return home with the following Assist for transportation   Equipment Recommendations  None recommended by PT    Recommendations for Other Services       Precautions / Restrictions Precautions Precautions: Fall Precaution Comments: L drop foot from astrocytoma as a child has a brace but doesn't wear it. watch sats     Mobility  Bed Mobility Overal bed mobility: Modified Independent                  Transfers Overall transfer level: Modified independent                      Ambulation/Gait Ambulation/Gait assistance: Supervision Gait Distance (Feet): 550 Feet Assistive device: None Gait Pattern/deviations: Decreased stride length, Decreased dorsiflexion - left, Step-through pattern   Gait velocity interpretation: 1.31 - 2.62 ft/sec, indicative of limited community ambulator   General Gait  Details: pt with one standing rest break after 450' with single drop in SPO2 to 88% with recovery with standing rest and returned to 92% on 1L. Increased gait speed and tolerance this session with slight veering with fatigue   Stairs             Wheelchair Mobility    Modified Rankin (Stroke Patients Only)       Balance Overall balance assessment: Mild deficits observed, not formally tested                                          Cognition Arousal/Alertness: Awake/alert Behavior During Therapy: WFL for tasks assessed/performed Overall Cognitive Status: Within Functional Limits for tasks assessed                                          Exercises General Exercises - Lower Extremity Long Arc Quad: AROM, Both, Seated, 20 reps Hip Flexion/Marching: AROM, Both, Seated, 20 reps    General Comments        Pertinent Vitals/Pain Pain Assessment Pain Assessment: No/denies pain    Home Living                          Prior Function            PT Goals (current goals can now be found in the  care plan section) Progress towards PT goals: Progressing toward goals    Frequency    Min 3X/week      PT Plan Current plan remains appropriate    Co-evaluation              AM-PAC PT "6 Clicks" Mobility   Outcome Measure  Help needed turning from your back to your side while in a flat bed without using bedrails?: None Help needed moving from lying on your back to sitting on the side of a flat bed without using bedrails?: None Help needed moving to and from a bed to a chair (including a wheelchair)?: None Help needed standing up from a chair using your arms (e.g., wheelchair or bedside chair)?: None Help needed to walk in hospital room?: A Little Help needed climbing 3-5 steps with a railing? : A Little 6 Click Score: 22    End of Session Equipment Utilized During Treatment: Oxygen Activity Tolerance: Patient  tolerated treatment well Patient left: in chair;with call bell/phone within reach Nurse Communication: Mobility status PT Visit Diagnosis: Unsteadiness on feet (R26.81);Difficulty in walking, not elsewhere classified (R26.2)     Time: [id number] PT Time Calculation (min) (ACUTE ONLY): 26 min  Charges:  $Gait Training: 8-22 mins $Therapeutic Activity: 8-22 mins                     Bayard Males, PT Acute Rehabilitation Services Office: [place] 06/07/2022, 9:00 AM

## 2022-06-07 NOTE — Progress Notes (Addendum)
Advanced Heart Failure Rounding Note  PCP-Cardiologist: None   Subjective:    Remains on milrinone 0.25. Unable to follow co-ox due to markedly high TGs. She continues to diurese well and scr continues to trend down.   - 5.9L in UOP yesterday. Wt down 7 lb. CVP down 8-9  SCr 2.60>>2.26   K 4.6  Na 130   Remains on insulin gtt. TG trending back down   Feels ok. Denies dyspnea. No current complaints.   Objective:   Weight Range: 61.4 kg Body mass index is 29.29 kg/m.   Vital Signs:   Temp:  [97.8 F (36.6 C)-98.5 F (36.9 C)] 97.8 F (36.6 C) (03/12 0303) Pulse Rate:  [102-128] 124 (03/12 0400) Resp:  [10-23] 19 (03/12 0400) BP: (120-166)/(70-105) 129/86 (03/12 0400) SpO2:  [88 %-100 %] 94 % (03/12 0400) Weight:  [61.4 kg] 61.4 kg (03/12 0500) Last BM Date : 06/06/22  Weight change: Filed Weights   06/05/22 0500 06/06/22 0500 06/07/22 0500  Weight: 68.4 kg 64.6 kg 61.4 kg    Intake/Output:   Intake/Output Summary (Last 24 hours) at 06/07/2022 0724 Last data filed at 06/07/2022 0600 Gross per 24 hour  Intake 852.72 ml  Output 5900 ml  Net -5047.28 ml      Physical Exam    CVP 9  General:  chronically ill appearing. No respiratory difficulty HEENT: normal Neck: supple. Thick short neck, JVD not well visualized. Carotids 2+ bilat; no bruits. No lymphadenopathy or thyromegaly appreciated. Cor: PMI nondisplaced. Regular rhythm tachy rate. 2/6 MR and TR murmurs  Lungs: decreased BS at the bases b/l  Abdomen: soft, nontender, nondistended. No hepatosplenomegaly. No bruits or masses. Good bowel sounds. Extremities: no cyanosis, clubbing, rash, edema + RUE PICC  Neuro: alert & oriented x 3, cranial nerves grossly intact. moves all 4 extremities w/o difficulty. Affect pleasant.  Telemetry   Sinus tach 110s   EKG    N/A   Labs    CBC Recent Labs    06/06/22 0406 06/07/22 0416  WBC 10.4 11.9*  HGB 8.8* 9.4*  HCT 24.2* 25.0*  MCV 86.1 84.7  PLT  203 [id number]   Basic Metabolic Panel Recent Labs    06/06/22 0406 06/06/22 0652 06/07/22 0416  NA 129* 132* 130*  K 4.5 4.3 4.6  CL 88* 88* 85*  CO2 '24 23 26  '$ GLUCOSE 175* 108* 127*  BUN 34* 34* 41*  CREATININE 2.62* 2.60* 2.26*  CALCIUM 7.5* 7.7* 8.0*  MG 2.1  --  1.9   Liver Function Tests Recent Labs    06/06/22 0652 06/07/22 0416  AST 132* 116*  ALT 88* 77*  ALKPHOS 230* 221*  BILITOT 2.2* 2.9*  PROT 6.4* 7.0  ALBUMIN 2.1* 2.3*   No results for input(s): "LIPASE", "AMYLASE" in the last 72 hours. Cardiac Enzymes No results for input(s): "CKTOTAL", "CKMB", "CKMBINDEX", "TROPONINI" in the last 72 hours.  BNP: BNP (last 3 results) Recent Labs    04/05/22 1009 06/04/22 0400 06/05/22 0432  BNP 36.9 550.1* 339.5*    ProBNP (last 3 results) No results for input(s): "PROBNP" in the last 8760 hours.   D-Dimer No results for input(s): "DDIMER" in the last 72 hours. Hemoglobin A1C No results for input(s): "HGBA1C" in the last 72 hours.  Fasting Lipid Panel Recent Labs    06/07/22 0416  TRIG 2,168*   Thyroid Function Tests No results for input(s): "TSH", "T4TOTAL", "T3FREE", "THYROIDAB" in the last 72 hours.  Invalid input(s): "FREET3"  Other results:   Imaging    No results found.   Medications:     Scheduled Medications:  sodium chloride   Intravenous Once   aspirin EC  81 mg Oral Daily   Chlorhexidine Gluconate Cloth  6 each Topical Daily   DULoxetine  30 mg Oral QHS   fenofibrate  160 mg Oral Daily   furosemide  80 mg Intravenous BID   heparin injection (subcutaneous)  5,000 Units Subcutaneous Q8H   levothyroxine  125 mcg Oral Q0600   lipase/protease/amylase  36,000 Units Oral TID   loratadine  10 mg Oral Daily   pantoprazole  40 mg Oral Daily   rosuvastatin  40 mg Oral Daily   sodium bicarbonate  650 mg Oral BID   sodium chloride flush  10-40 mL Intracatheter Q12H   spironolactone  12.5 mg Oral Daily    Infusions:  insulin 0.1  Units/kg/hr (06/07/22 0408)   milrinone 0.25 mcg/kg/min (06/07/22 0400)    PRN Medications: acetaminophen, albuterol, dextrose, docusate sodium, guaiFENesin-dextromethorphan, HYDROmorphone (DILAUDID) injection, ondansetron (ZOFRAN) IV, mouth rinse, polyethylene glycol, prochlorperazine, sodium chloride flush    Patient Profile   Ms. 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 AHF team to see for volume management.    Assessment/Plan    Severe hyperTG - Due to LPL deficiency w/ recent pancreatitis. - Continue fenofibrate  - Previously offered referral to [facility] for further management. She prefers to continue with her endocrinologist at [facility] who currently manages/follows. - >5000 on admission, downtrending on insulin gtt, 2168 today  - per primary - CTA/P negative for pancreatitis or other acute findings   NICM, recovered EF, previously with HFrEF (EF 29%) - Echo (5/23): EF 45-50%, RV normal  - Echo 9/23: EF 50-55%, RV normal. mild MR, mild-mod TR -  Cardiomems, last read 3/2: 18 (PAD goal 17) - Echo this admission with EF 30-35% (lower than past), RV mildly dilated, moderate-severe MR, severe TR, dilated IVC.  - CVP 8-9. Continue IV lasix 80 mg twice a day .  - Continue milrinone 0.25 mcg. Unable to follow co-ox due to markedly high TGs. Will wean once fully diuresed.  - Increase Spiro to 25 mg  - Off coreg, sildenafil, and ivabradine for now. Once diuresed will reconsider.  - Off Jardiance due to frequent GU infections (+UTI this admission). Off Entresto with AKI. Off spiro w/ hyperkalemia  - Would repeat echo after full diuresis to reassess MR and TR.    Hx CAD - Single vessel RCA occlusion (small co-dominant vessel) on cardiac cath 09/08. Medical  management - Continue aspirin + statin. - No chest pain on exam    CKD w/ AKI -Scr baseline variable with multiple AKIs, typically 1.5-2.0 -Scr this admit 1.07> 2.6>2.26 today.  - Making > 5 liters urine.    5. Pulmonary hypertension - Mild to moderate on RHC 5/23. PVR 3.7 WU.   6. TR - Prominent v-waves in RA tracing on RHC 5/23. - Echo 5/23 shows only mild TR  - mild to mod on 9/23 echo - Severe on Echo this admit. Will repeat after diuresis    7. Type 3c DM, DKA - Follows w/ endocrinology at [facility]. - 2/2 chronic pancreatitis  - per primary   9. Chronic abdominal swelling/bloating - Seen at [facility] (11/20) for IBS,  per chart review. - Had penumatosis intestinalis. She was started on pancreatic enzyme replacement. - EGD (10/21): normal, bx taken to assess for celiac. - CT abdomen (2/22) and RUQ US showed no obvious gallbladder stones. - HIDA (4/22): ended early due to tachypnea, but cystic duct and gallbladder appeared WNL. - CT abdomen 12/22 unremarkable  - US abdomen 11/25/21: No ascites - suspect primarily fatty liver - Abd XR 12/27: mild gaseous distention    10. Poor Med Compliance - Followed by paramedicine OP.  Will need HF Paramedicine at discharge.    Ambulate w/ PT today   Length of Stay: [address], PA-C  06/07/2022, 7:24 AM  Advanced Heart Failure Team Pager [phone] (M-F; 7a - 5p)  Please contact [facility] for night-coverage after hours (5p -7a ) and weekends on [email or web address]  Patient seen with PA, agree with the above note.    She diuresed very well yesterday, net negative about 5 L.  Breathing better.  CVP 8-9.  Co-ox 74% on milrinone 0.25.  She remains on insulin gtt for elevated triglycerides, 2168 today.   General: NAD Neck: JVP 9-10 cm, no thyromegaly or thyroid nodule.  Lungs: Clear to auscultation bilaterally with normal respiratory effort. CV: Nondisplaced PMI.  Heart regular S1/S2, no S3/S4, 2/6 HSM LLSB/apex.  No peripheral  edema.   Abdomen: Soft, nontender, no hepatosplenomegaly, mild distention.  Skin: Intact without lesions or rashes.  Neurologic: Alert and oriented x 3.  Psych: Normal affect. Extremities: No clubbing or cyanosis.  HEENT: Normal.   Familial hypertriglyceridemia due to lipoprotein lipase deficiency.  TGs markedly high at admission in setting of DKA.  TGs still 2168 this morning.  - Continuing insulin gtt per hospitalist.    Echo this admission with EF 30-35% (lower than past), RV mildly dilated, moderate-severe MR, severe TR, dilated IVC.  Good diuresis yesterday, weight down 7 lbs and CVP 8-9.  Co-ox 74% on milrinone 0.25.  Creatinine lower at 2.26.  - Continue milrinone 0.25 while diuresing.   - Continue Lasix 80 mg IV bid for 1 more day.  - Increase spironolactone to 25 mg daily.  - Other GDMT limited by elevated creatinine.  - Would repeat echo after full diuresis to reassess MR and TR.   CRITICAL CARE Performed by: Loralie Champagne  Total critical care time: 35 minutes  Critical care time was exclusive of separately billable procedures and treating other patients.  Critical care was necessary to treat or prevent imminent or life-threatening deterioration.  Critical care was time spent personally by me on the following activities: development of treatment plan with patient and/or surrogate as well as nursing, discussions with consultants, evaluation of patient's response to treatment, examination of patient, obtaining history from patient or surrogate, ordering and performing treatments and interventions, ordering and review of laboratory studies, ordering and review of radiographic studies, pulse oximetry and re-evaluation of patient's condition.  Loralie Champagne 06/07/2022 7:46 AM

## 2022-06-08 ENCOUNTER — Inpatient Hospital Stay: Payer: Self-pay

## 2022-06-08 DIAGNOSIS — I5043 Acute on chronic combined systolic (congestive) and diastolic (congestive) heart failure: Secondary | ICD-10-CM | POA: Diagnosis not present

## 2022-06-08 DIAGNOSIS — E781 Pure hyperglyceridemia: Secondary | ICD-10-CM | POA: Diagnosis not present

## 2022-06-08 LAB — CBC
HCT: 27.9 % — ABNORMAL LOW (ref 36.0–46.0)
Hemoglobin: 9.7 g/dL — ABNORMAL LOW (ref 12.0–15.0)
MCH: 30.2 pg (ref 26.0–34.0)
MCHC: 34.8 g/dL (ref 30.0–36.0)
MCV: 86.9 fL (ref 80.0–100.0)
Platelets: 241 10*3/uL (ref 150–400)
RBC: 3.21 MIL/uL — ABNORMAL LOW (ref 3.87–5.11)
RDW: 16.4 % — ABNORMAL HIGH (ref 11.5–15.5)
WBC: 12.7 10*3/uL — ABNORMAL HIGH (ref 4.0–10.5)
nRBC: 0.2 % (ref 0.0–0.2)

## 2022-06-08 LAB — GLUCOSE, CAPILLARY
Glucose-Capillary: 159 mg/dL — ABNORMAL HIGH (ref 70–99)
Glucose-Capillary: 174 mg/dL — ABNORMAL HIGH (ref 70–99)
Glucose-Capillary: 133 mg/dL — ABNORMAL HIGH (ref 70–99)
Glucose-Capillary: 64 mg/dL — ABNORMAL LOW (ref 70–99)
Glucose-Capillary: 87 mg/dL (ref 70–99)
Glucose-Capillary: 109 mg/dL — ABNORMAL HIGH (ref 70–99)
Glucose-Capillary: 177 mg/dL — ABNORMAL HIGH (ref 70–99)
Glucose-Capillary: 189 mg/dL — ABNORMAL HIGH (ref 70–99)
Glucose-Capillary: 164 mg/dL — ABNORMAL HIGH (ref 70–99)
Glucose-Capillary: 161 mg/dL — ABNORMAL HIGH (ref 70–99)
Glucose-Capillary: 138 mg/dL — ABNORMAL HIGH (ref 70–99)
Glucose-Capillary: 179 mg/dL — ABNORMAL HIGH (ref 70–99)
Glucose-Capillary: 172 mg/dL — ABNORMAL HIGH (ref 70–99)
Glucose-Capillary: 371 mg/dL — ABNORMAL HIGH (ref 70–99)
Glucose-Capillary: 359 mg/dL — ABNORMAL HIGH (ref 70–99)
Glucose-Capillary: 353 mg/dL — ABNORMAL HIGH (ref 70–99)
Glucose-Capillary: 318 mg/dL — ABNORMAL HIGH (ref 70–99)
Glucose-Capillary: 228 mg/dL — ABNORMAL HIGH (ref 70–99)

## 2022-06-08 LAB — COMPREHENSIVE METABOLIC PANEL
ALT: 66 U/L — ABNORMAL HIGH (ref 0–44)
AST: 72 U/L — ABNORMAL HIGH (ref 15–41)
Albumin: 2.5 g/dL — ABNORMAL LOW (ref 3.5–5.0)
Alkaline Phosphatase: 222 U/L — ABNORMAL HIGH (ref 38–126)
Anion gap: 16 — ABNORMAL HIGH (ref 5–15)
BUN: 41 mg/dL — ABNORMAL HIGH (ref 6–20)
CO2: 31 mmol/L (ref 22–32)
Calcium: 8.7 mg/dL — ABNORMAL LOW (ref 8.9–10.3)
Chloride: 86 mmol/L — ABNORMAL LOW (ref 98–111)
Creatinine, Ser: 2.05 mg/dL — ABNORMAL HIGH (ref 0.44–1.00)
GFR, Estimated: 33 mL/min — ABNORMAL LOW (ref >60)
Glucose, Bld: 82 mg/dL (ref 70–99)
Potassium: 3.6 mmol/L (ref 3.5–5.1)
Sodium: 133 mmol/L — ABNORMAL LOW (ref 135–145)
Total Bilirubin: 1.7 mg/dL — ABNORMAL HIGH (ref 0.3–1.2)
Total Protein: 7.2 g/dL (ref 6.5–8.1)

## 2022-06-08 LAB — COOXEMETRY PANEL
Carboxyhemoglobin: 1.8 % — ABNORMAL HIGH (ref 0.5–1.5)
Methemoglobin: <0.7 % (ref 0.0–1.5)
O2 Saturation: 70.1 %
Total hemoglobin: 8.8 g/dL — ABNORMAL LOW (ref 12.0–16.0)

## 2022-06-08 LAB — TRIGLYCERIDES
Triglycerides: 1515 mg/dL — ABNORMAL HIGH (ref <150)
Triglycerides: 1825 mg/dL — ABNORMAL HIGH (ref <150)

## 2022-06-08 LAB — MAGNESIUM: Magnesium: 2.3 mg/dL (ref 1.7–2.4)

## 2022-06-08 MED ORDER — IVABRADINE HCL 7.5 MG PO TABS
7.5000 mg | ORAL_TABLET | Freq: Two times a day (BID) | ORAL | Status: DC
  Administered 2022-06-08 – 2022-06-12 (×10): 7.5 mg via ORAL
  Filled 2022-06-08 (×12): qty 1

## 2022-06-08 MED ORDER — INSULIN ASPART 100 UNIT/ML IJ SOLN
0.0000 [IU] | Freq: Every day | INTRAMUSCULAR | Status: DC
  Administered 2022-06-08: 5 [IU] via SUBCUTANEOUS

## 2022-06-08 MED ORDER — HYDROMORPHONE HCL 1 MG/ML IJ SOLN
0.5000 mg | INTRAMUSCULAR | Status: DC | PRN
  Administered 2022-06-08 – 2022-06-09 (×5): 0.5 mg via INTRAVENOUS
  Filled 2022-06-08 (×5): qty 0.5

## 2022-06-08 MED ORDER — MILRINONE LACTATE IN DEXTROSE 20-5 MG/100ML-% IV SOLN
0.1250 ug/kg/min | INTRAVENOUS | Status: DC
  Administered 2022-06-08 (×2): 0.125 ug/kg/min via INTRAVENOUS
  Filled 2022-06-08 (×2): qty 100

## 2022-06-08 MED ORDER — INSULIN ASPART 100 UNIT/ML IJ SOLN
0.0000 [IU] | Freq: Three times a day (TID) | INTRAMUSCULAR | Status: DC
  Administered 2022-06-09: 2 [IU] via SUBCUTANEOUS

## 2022-06-08 MED ORDER — HYDRALAZINE HCL 25 MG PO TABS
25.0000 mg | ORAL_TABLET | Freq: Three times a day (TID) | ORAL | Status: DC
  Administered 2022-06-08 – 2022-06-10 (×7): 25 mg via ORAL
  Filled 2022-06-08 (×8): qty 1

## 2022-06-08 MED ORDER — POTASSIUM CHLORIDE CRYS ER 20 MEQ PO TBCR
40.0000 meq | EXTENDED_RELEASE_TABLET | Freq: Once | ORAL | Status: AC
  Administered 2022-06-08: 40 meq via ORAL
  Filled 2022-06-08: qty 2

## 2022-06-08 MED ORDER — HYDRALAZINE HCL 25 MG PO TABS: 25.0000 mg | ORAL_TABLET | Freq: Three times a day (TID) | ORAL | Status: DC

## 2022-06-08 MED ORDER — SILDENAFIL CITRATE 20 MG PO TABS
20.0000 mg | ORAL_TABLET | Freq: Three times a day (TID) | ORAL | Status: DC
  Administered 2022-06-08 – 2022-06-12 (×14): 20 mg via ORAL
  Filled 2022-06-08 (×14): qty 1

## 2022-06-08 MED ORDER — FUROSEMIDE 10 MG/ML IJ SOLN
80.0000 mg | Freq: Once | INTRAMUSCULAR | Status: AC
  Administered 2022-06-08: 80 mg via INTRAVENOUS
  Filled 2022-06-08: qty 8

## 2022-06-08 NOTE — Progress Notes (Signed)
Hypoglycemic Event  CBG: 64  Treatment: patient requested to eat saltines and peanut butter. After 15 minutes, she also drank 4 oz of apple juice and ate 2 packs of graham crackers.  Symptoms: Sweaty, Shaky, Hungry, and Nervous/irritable  Follow-up CBG: Time:0538 CBG Result:87  Possible Reasons for Event: Inadequate meal intake and Medication regimen: insulin drip  Comments/MD notified:   Raphael Gibney

## 2022-06-08 NOTE — TOC Progression Note (Signed)
Transition of Care [facility]) - Progression Note    Patient Details  Name: Addiley Fara MRN: [id number] Date of Birth: [birth year]  Transition of Care [facility]) CM/SW Contact  Graves-Bigelow, Ocie Cornfield, RN Phone Number: 06/08/2022, 12:00 PM  Clinical Narrative:  Patient was discussed in progression rounds. Patient continues on milrinone gtt. Case Manager will continue to follow for transition of care needs as the patient progresses.     Expected Discharge Plan: Home/Self Care Barriers to Discharge: Continued Medical Work up  Expected Discharge Plan and Services In-house Referral: NA Discharge Planning Services: CM Consult   Living arrangements for the past 2 months: Apartment Expected Discharge Date: 06/01/22                 DME Agency: NA   Social Determinants of Health ([place]) Interventions [place]: No Food Insecurity (03/14/2022)  Housing: Medium Risk (04/05/2022)  Transportation Needs: Unmet Transportation Needs (04/05/2022)  Utilities: Not At Risk (03/14/2022)  Alcohol Screen: Low Risk  (12/10/2021)  Depression (PHQ2-9): Low Risk  (12/10/2021)  Financial Resource Strain: High Risk (04/05/2022)  Physical Activity: Sufficiently Active (12/10/2021)  Social Connections: Socially Integrated (12/10/2021)  Stress: No Stress Concern Present (12/10/2021)  Tobacco Use: Low Risk  (05/29/2022)  Readmission Risk Interventions    06/01/2022   11:13 AM 03/24/2022    5:05 PM 12/07/2021    4:36 PM  Readmission Risk Prevention Plan  Transportation Screening Complete Complete Complete  Medication Review (RN Care Manager) Referral to Pharmacy Complete Complete  PCP or Specialist appointment within 3-5 days of discharge  Patient refused Complete  HRI or [place] Complete  Complete  SW Recovery Care/Counseling Consult Complete  Complete  Palliative Care Screening Not Applicable  Not [place] Not Applicable Not Applicable Not Applicable

## 2022-06-08 NOTE — Plan of Care (Signed)

## 2022-06-08 NOTE — Progress Notes (Signed)
Peripherally Inserted Central Catheter Placement  The IV Nurse has discussed with the patient and/or persons authorized to consent for the patient, the purpose of this procedure and the potential benefits and risks involved with this procedure.  The benefits include less needle sticks, lab draws from the catheter, and the patient may be discharged home with the catheter. Risks include, but not limited to, infection, bleeding, blood clot (thrombus formation), and puncture of an artery; nerve damage and irregular heartbeat and possibility to perform a PICC exchange if needed/ordered by physician.  Alternatives to this procedure were also discussed.  Bard Power PICC patient education guide, fact sheet on infection prevention and patient information card has been provided to patient /or left at bedside.  PICC consent signed on 06/02/22 0820.  PICC Placement Documentation  PICC Double Lumen [id number] Right Basilic 35 cm 2 cm (Active)  Indication for Insertion or Continuance of Line Prolonged intravenous therapies 06/08/22 0800  Exposed Catheter (cm) 2 cm 06/05/22 2000  Site Assessment Clean, Dry, Intact 06/08/22 0800  Lumen #1 Status Infusing;Flushed;Blood return noted 06/08/22 0800  Lumen #2 Status In-line blood sampling system in place;Infusing;Flushed;Blood return noted 06/08/22 0800  Dressing Type Transparent 06/08/22 0800  Dressing Status Antimicrobial disc in place 06/08/22 0800  Safety Lock Intact 06/08/22 0800  Line Care Connections checked and tightened;Zeroed and calibrated 06/08/22 0800  Dressing Intervention Removed;Dressing changed;Antimicrobial disc changed 06/05/22 2000  Dressing Change Due 06/12/22 06/08/22 0800     PICC Triple Lumen 06/08/22 Left Brachial 33 cm 1 cm (Active)  Indication for Insertion or Continuance of Line Vasoactive infusions;Prolonged intravenous therapies 06/08/22 1821  Exposed Catheter (cm) 1 cm 06/08/22 1821  Site Assessment Clean, Dry, Intact 06/08/22 1821  Lumen  #1 Status Flushed;Saline locked;Blood return noted 06/08/22 1821  Lumen #2 Status Flushed;Saline locked;Blood return noted 06/08/22 1821  Lumen #3 Status Flushed;Saline locked;Blood return noted 06/08/22 1821  Dressing Type Transparent;Securing device 06/08/22 1821  Dressing Status Antimicrobial disc in place;Clean, Dry, Intact 06/08/22 1821  Safety Lock Not Applicable [id number]  Line Care Connections checked and tightened 06/08/22 1821  Line Adjustment (NICU/IV Team Only) No 06/08/22 [place] dressing 06/08/22 1821  Dressing Change Due 06/15/22 06/08/22 [place], Nicolette Bang 06/08/2022, 6:23 PM

## 2022-06-08 NOTE — Inpatient Diabetes Management (Signed)
Inpatient Diabetes Program Recommendations  AACE/ADA: New Consensus Statement on Inpatient Glycemic Control (2015)  Target Ranges:  Prepandial:   less than 140 mg/dL      Peak postprandial:   less than 180 mg/dL (1-2 hours)      Critically ill patients:  140 - 180 mg/dL    Latest Reference Range & Units 06/07/22 19:47 06/07/22 21:27 06/07/22 22:57 06/07/22 23:42 06/08/22 01:02 06/08/22 02:12 06/08/22 03:37 06/08/22 05:16 06/08/22 05:38 06/08/22 06:32 06/08/22 08:45  Glucose-Capillary 70 - 99 mg/dL 170 (H) 201 (H) 257 (H) 224 (H) 159 (H) 174 (H) 133 (H) 64 (L) 87 109 (H) 177 (H)    Admit with:  Diabetic ketoacidosis, type 2 diabetes Pancreatitis, hypertriglyceridemia   History: DM, Pancreatitis, Astrocytoma, Lipoprotein deficiency    Home DM Meds: Humulin R U-500 Insulin 290 units TID with each meal                              Humulin R U-500 Insulin 100 units QHS   Current Orders: IV Insulin Drip for High TG (0.2 units/kg/hr)   MD- Note Hypoglycemia overnight. IV fluids without dextrose.  -   Pt needs dextrose added to IV fluids to support IV insulin dose to correct triglycerides.   --Will follow patient during hospitalization--  Tama Headings RN, MSN, BC-ADM Inpatient Diabetes Coordinator Team Pager [phone] (8a-5p)

## 2022-06-08 NOTE — Progress Notes (Signed)
At approximately 1645 this RN helped patient ambulate to bathroom. Patient wanted to wash up in bathroom. Patient began pulling gown off and pulled PICC out. MD Darliss Cheney notified. See new orders

## 2022-06-08 NOTE — Progress Notes (Addendum)
Advanced Heart Failure Rounding Note  PCP-Cardiologist: None   Subjective:    Remains on milrinone 0.25. co-ox 70%, (? If accurate due to markedly high TGs). She continues to diurese well and scr continues to trend down.   - Net negative 2L yesterday. Wt down another 5 lb. CVP down 6-7  SCr 2.60>>2.26>2.05   K 3.6 Na 133  Remains on insulin gtt. TG trending back down 2700>>1500.   Laying in bed. No complaints. Denies dyspnea. No chest pain.    Objective:   Weight Range: 59.2 kg Body mass index is 28.24 kg/m.   Vital Signs:   Temp:  [97.7 F (36.5 C)-98.4 F (36.9 C)] 98.4 F (36.9 C) (03/13 0400) Pulse Rate:  [107-142] 114 (03/13 0700) Resp:  [12-24] 13 (03/13 0700) BP: (119-168)/(66-109) 135/75 (03/13 0700) SpO2:  [85 %-100 %] 99 % (03/13 0700) Weight:  [59.2 kg] 59.2 kg (03/13 0400) Last BM Date : 06/07/22  Weight change: Filed Weights   06/06/22 0500 06/07/22 0500 06/08/22 0400  Weight: 64.6 kg 61.4 kg 59.2 kg    Intake/Output:   Intake/Output Summary (Last 24 hours) at 06/08/2022 0739 Last data filed at 06/08/2022 0400 Gross per 24 hour  Intake 452.06 ml  Output 2450 ml  Net -1997.94 ml      Physical Exam    CVP 6-7  General:  chronically ill appearing. No respiratory difficulty HEENT: normal Neck: supple. Thick short neck, JVD not well visualized. Carotids 2+ bilat; no bruits. No lymphadenopathy or thyromegaly appreciated. Cor: PMI nondisplaced. Regular rhythm tachy rate. 2/6 MR and TR murmurs  Lungs: CTAB Abdomen: soft, nontender, nondistended. No hepatosplenomegaly. No bruits or masses. Good bowel sounds. Extremities: no cyanosis, clubbing, rash, No edema + RUE PICC  Neuro: alert & oriented x 3, cranial nerves grossly intact. moves all 4 extremities w/o difficulty. Affect pleasant.  Telemetry   Sinus tach 110s   EKG    N/A   Labs    CBC Recent Labs    06/07/22 0416 06/08/22 0516  WBC 11.9* 12.7*  HGB 9.4* 9.7*  HCT 25.0*  27.9*  MCV 84.7 86.9  PLT 236 [id number]   Basic Metabolic Panel Recent Labs    06/07/22 0416 06/08/22 0516  NA 130* 133*  K 4.6 3.6  CL 85* 86*  CO2 26 31  GLUCOSE 127* 82  BUN 41* 41*  CREATININE 2.26* 2.05*  CALCIUM 8.0* 8.7*  MG 1.9 2.3   Liver Function Tests Recent Labs    06/07/22 0416 06/08/22 0516  AST 116* 72*  ALT 77* 66*  ALKPHOS 221* 222*  BILITOT 2.9* 1.7*  PROT 7.0 7.2  ALBUMIN 2.3* 2.5*   No results for input(s): "LIPASE", "AMYLASE" in the last 72 hours. Cardiac Enzymes No results for input(s): "CKTOTAL", "CKMB", "CKMBINDEX", "TROPONINI" in the last 72 hours.  BNP: BNP (last 3 results) Recent Labs    04/05/22 1009 06/04/22 0400 06/05/22 0432  BNP 36.9 550.1* 339.5*    ProBNP (last 3 results) No results for input(s): "PROBNP" in the last 8760 hours.   D-Dimer No results for input(s): "DDIMER" in the last 72 hours. Hemoglobin A1C No results for input(s): "HGBA1C" in the last 72 hours.  Fasting Lipid Panel Recent Labs    06/08/22 0516  TRIG 1,515*   Thyroid Function Tests No results for input(s): "TSH", "T4TOTAL", "T3FREE", "THYROIDAB" in the last 72 hours.  Invalid input(s): "FREET3"  Other results:   Imaging    No results found.  Medications:     Scheduled Medications:  sodium chloride   Intravenous Once   aspirin EC  81 mg Oral Daily   Chlorhexidine Gluconate Cloth  6 each Topical Daily   DULoxetine  30 mg Oral QHS   fenofibrate  160 mg Oral Daily   furosemide  80 mg Intravenous BID   heparin injection (subcutaneous)  5,000 Units Subcutaneous Q8H   levothyroxine  125 mcg Oral Q0600   lipase/protease/amylase  36,000 Units Oral TID   loratadine  10 mg Oral Daily   pantoprazole  40 mg Oral Daily   rosuvastatin  40 mg Oral Daily   sodium bicarbonate  650 mg Oral BID   sodium chloride flush  10-40 mL Intracatheter Q12H   spironolactone  25 mg Oral Daily    Infusions:  insulin 0.1 Units/kg/hr (06/08/22 0439)    milrinone 0.25 mcg/kg/min (06/07/22 1800)    PRN Medications: acetaminophen, albuterol, dextrose, docusate sodium, guaiFENesin-dextromethorphan, HYDROmorphone (DILAUDID) injection, ondansetron (ZOFRAN) IV, mouth rinse, polyethylene glycol, prochlorperazine, sodium chloride flush    Patient Profile   Jennifer [name] is a 32 y.o.woman with a complex PMHx including 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 AHF team to see for volume management.    Assessment/Plan    Severe hyperTG - Due to LPL deficiency w/ recent pancreatitis. - Continue fenofibrate  - Previously offered referral to [facility] for further management. She prefers to continue with her endocrinologist at [facility] who currently manages/follows. - >5000 on admission, downtrending on insulin gtt, 1515 today  - per primary - CTA/P negative for pancreatitis or other acute findings   NICM, recovered EF, previously with HFrEF (EF 29%) - Echo (5/23): EF 45-50%, RV normal  - Echo 9/23: EF 50-55%, RV normal. mild MR, mild-mod TR -  Cardiomems, last read 3/2: 18 (PAD goal 17) - Echo this admission with EF 30-35% (lower than past), RV mildly dilated, moderate-severe MR, severe TR, dilated IVC.  - CVP 6-7. Give 1 more dose of 80 mg IV Lasix, then transition back to PO .  - Wean milrinone to 0.125 mcg. Unable to follow co-ox due to markedly high TGs.  - Continue Spiro 25 mg  - Add hydralazine 25 mg tid  - Restart ivabradine 7.5 bid   - Hold restart of coreg - restart sildenafil soon  - Off Jardiance due to frequent GU infections (+UTI this admission). Off Entresto with AKI.  - Would repeat echo after full diuresis to reassess MR and TR.    Hx CAD - Single vessel RCA occlusion (small co-dominant vessel) on cardiac cath 09/08. Medical management - Continue aspirin +  statin. - No chest pain on exam    CKD w/ AKI -Scr baseline variable with multiple AKIs, typically 1.5-2.0 -Scr this admit 1.07> 2.6>2.26>2.05 today.     5. Pulmonary hypertension - Mild to moderate on RHC 5/23. PVR 3.7 WU.   6. TR - Prominent v-waves in RA tracing on RHC 5/23. - Echo 5/23 shows only mild TR  - mild to mod on 9/23 echo - Severe on Echo this admit. Will repeat after diuresis    7. Type 3c DM, DKA - Follows w/ endocrinology at [facility]. - 2/2 chronic pancreatitis  - per primary   9. Chronic abdominal swelling/bloating - Seen at [facility] (11/20) for IBS, per chart review. - Had penumatosis intestinalis. She was started on  pancreatic enzyme replacement. - EGD (10/21): normal, bx taken to assess for celiac. - CT abdomen (2/22) and RUQ US showed no obvious gallbladder stones. - HIDA (4/22): ended early due to tachypnea, but cystic duct and gallbladder appeared WNL. - CT abdomen 12/22 unremarkable  - US abdomen 11/25/21: No ascites - suspect primarily fatty liver - Abd XR 12/27: mild gaseous distention    10. Poor Med Compliance - Followed by paramedicine OP.  Will need HF Paramedicine at discharge.    Ambulate w/ PT today   Length of Stay: [place], PA-C  06/08/2022, 7:39 AM  Advanced Heart Failure Team Pager [phone] (M-F; 7a - 5p)  Please contact [facility] for night-coverage after hours (5p -7a ) and weekends on [email or web address]  Patient seen with PA, agree with the above note.  Weight down 5 more lbs with diuresis.  CVP 5-6 on my read.  Co-ox 70% on milrinone 0.25.  Creatinine lower at 2.06.    No complaints.   General: NAD Neck: No JVD, no thyromegaly or thyroid nodule.  Lungs: Clear to auscultation bilaterally with normal respiratory effort. CV: Nondisplaced PMI.  Heart regular S1/S2, no S3/S4, no murmur.  No peripheral edema.   Abdomen: Soft, nontender, no hepatosplenomegaly, no distention.  Skin: Intact without lesions or rashes.   Neurologic: Alert and oriented x 3.  Psych: Normal affect. Extremities: No clubbing or cyanosis.  HEENT: Normal.   Familial hypertriglyceridemia due to lipoprotein lipase deficiency.  TGs markedly high at admission in setting of DKA.  TGs 1515 (lower) this morning.  - Continuing insulin gtt per hospitalist.    Echo this admission with EF 30-35% (lower than past), RV mildly dilated, moderate-severe MR, severe TR, dilated IVC.  She diuresed well, CVP down to 6, co-ox stable 70%.  Weight down, creatinine down.   - Decrease milrinone to 0.125.  - Lasix 80 mg IV x 1 then hold.  - Continue spironolactone 25 mg daily.  - Start hydralazine 25 mg tid and back on sildenafil 20 tid.  Restart home ivabradine. - Would repeat echo after full diuresis to reassess MR and TR (tomorrow)  Jennifer [name] 06/08/2022 8:15 AM

## 2022-06-08 NOTE — Progress Notes (Signed)
PROGRESS NOTE    Jennifer [name]  [id number] DOB: 06-16-1990 DOA: 05/29/2022 PCP: Elinor Parkinson   Brief Narrative:  32 year old woman who presented to [facility] ED with abdominal pain, nausea and vomiting. PMHx significant for HTN, HLD, CHF (Echo 11/2021 EF 50-55%, no RWMAs, mildly elevated PASP), pulmonary HTN (s/p CardioMEMS implantation, on Corlanor and sildenafil), T2DM (followed by [facility]), NAFLD, hypothyroidism, astrocytoma, PCOS.   On presentation to ED, patient was found to have an anion gap with acidosis consistent with DKA/hyperglycemia. Was also found to have a TG > 5000.  Concern for ongoing pancreatitis.  CT A/P 3/3 without acute process, no e/o pancreatitis; +hepatosplenomegaly. -Patient's anion gap has closed, she was transitioned off insulin drip, but her workup was noted for evolving anemia, suspicious for hemolytic anemia with elevated LFTs, and LDH, as well patient with significant difficulty with lab accuracy likely in the setting of her hypertriglyceridemia.  As well with significant volume overload, managed by advanced CHF team secondary to acute on chronic systolic CHF.  Assessment & Plan:   Principal Problem:   Hypertriglyceridemia Active Problems:   Acute renal failure superimposed on stage 3a chronic kidney disease (HCC)   History of astrocytoma of brain   Type 2 diabetes mellitus with hyperlipidemia (HCC)   Hypothyroidism   Chronic systolic CHF (congestive heart failure) (HCC)   Essential hypertension   Nonischemic cardiomyopathy (HCC)   Elevated liver enzymes   Pulmonary hypertension (HCC)   NASH (nonalcoholic steatohepatitis)   UTI (urinary tract infection) due to Enterococcus   E. coli UTI (urinary tract infection)   DKA (diabetic ketoacidosis) (HCC)  Diabetic ketoacidosis, type 2 diabetes with hyperglycemia AGMA secondary to above -she is on insulin U-500 at home, reports she has been compliant, reports she ran out of Dexcom refills,  so she was not checking her CBGs.. -Insulin GTT DKA protocol has been discontinued after anion gap closed.  On 3/5. -On insulin gtt due to hypertriglyceridemia protocol, .(requiring around 1000 units of U-500 daily at home).   Acute on chronic pancreatitis secondary to hypertriglyceridemia ?? multifactorial chylomicronemia syndrome -With known history of pancreatitis in the past due to a familial hypertriglyceridemia -[name] on home Repatha -Continue fenofibrate and statin. -started  on insulin gtt. for triglyceride> 5000, glyceride improved but then got worse.  Now improving again.  Will continue to check triglyceride every 12 hours.  Clinically asymptomatic.  Will need to insulin drip until and unless triglycerides less than 500.  -Continue with Creon -Lipase within normal limit, no epigastric pain, no evidence of pancreatitis on imaging.     Acute on chronic diastolic/systolic CHF Anasarca cardiogenic shock Acute hypoxic respiratory failure -cardiology  assistance greatly appreciated, started on milrinone drip.  [name] comfortable on room air with no shortness of breath. -Diuresis per cardiology, back on Lasix 80 mg IV twice daily -Drop in EF to 35%, started on Aldactone Per cardiology, continue Spiro 25 mg  - Add hydralazine 25 mg tid  - Restart ivabradine 7.5 bid   - Hold restart of coreg - restart sildenafil soon    Anemia  -Due to anemia, with markedly elevated LDH, hematology input greatly appreciated, does not appear to be hemolysis . - -CT abdomen pelvis obtained, no evidence of retroperitoneal hematoma  -No evidence of B12 or iron deficiency   Hyperkalemia/hyponatremia -Significant electrolyte variation, due to inaccurate reading from her severe hypertriglyceridemia.  More accurate reading currently now her triglyceride has been controlled.  Mild hyponatremia.  No hypokalemia anymore.  Pulmonary hypertension - Continue Corlanor,  - sildenafil hold per cardiology    AKI (likely in the setting of DKA, UTI) on baseline CKD stage 4 -Baseline creatinine appears to be between 1.6-1.8, peaked at 2.6 and is improving and down to 2.26 today. Avoid nephrotoxic agents as able. Ensure adequate renal perfusion.  Likely cardiorenal syndrome.     E. Coli and enterococcus UTI -She was treated with Rocephin initially, then transition to [place] given sensitivities, it was discontinued given questionable hemolytic anemia .   Transaminitis, has chronic hepatosplenomegaly History of hepatic steatosis/fatty liver ?? 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   Hypothyroid TSH 5.284, Free T4 0.74.  - Continue levothyroxine   Deconditioning - PT consulted   Nontypical chest pain -Nontypical, pleuritic, EKG nonacute, encouraged use incentive spirometer, negative troponins   Hyperkalemia Resolved.  DVT prophylaxis: heparin injection 5,000 Units Start: 06/05/22 1400 Place and maintain sequential compression device Start: 06/02/22 1114 SCDs Start: 05/29/22 1400   Code Status: Full Code  Family Communication:  None present at bedside.  Plan of care discussed with patient in length and he/she verbalized understanding and agreed with it.  Status is: Inpatient Remains inpatient appropriate because: [name] on insulin and milrinone drip.   Estimated body mass index is 28.24 kg/m as calculated from the following:   Height as of this encounter: '4\' 9"'$  (1.448 m).   Weight as of this encounter: 59.2 kg.    Nutritional Assessment: Body mass index is 28.24 kg/m.Marland Kitchen Seen by dietician.  I agree with the assessment and plan as outlined below: Nutrition Status:        . Skin Assessment: I have examined the patient's skin and I agree with the  wound assessment as performed by the wound care RN as outlined below:    Consultants:  Cardiology  Procedures:  As above  Antimicrobials:  Anti-infectives (From admission, onward)    Start     Dose/Rate Route Frequency Ordered Stop   06/01/22 0000  nitrofurantoin, macrocrystal-monohydrate, (MACROBID) 100 MG capsule        100 mg Oral Every 12 hours 06/01/22 0936     05/31/22 1030  nitrofurantoin (macrocrystal-monohydrate) (MACROBID) capsule 100 mg  Status:  Discontinued        100 mg Oral Every 12 hours 05/31/22 0936 06/03/22 0717   05/30/22 1000  cefTRIAXone (ROCEPHIN) 1 g in sodium chloride 0.9 % 100 mL IVPB  Status:  Discontinued        1 g 200 mL/hr over 30 Minutes Intravenous Every 24 hours 05/30/22 0949 05/31/22 0936   05/29/22 1015  cefTRIAXone (ROCEPHIN) 1 g in sodium chloride 0.9 % 100 mL IVPB        1 g 200 mL/hr over 30 Minutes Intravenous  Once 05/29/22 1010 05/29/22 1324         Subjective:  Patient seen and examined.  She has no complaints.  Objective: Vitals:   06/08/22 0700 06/08/22 0800 06/08/22 0900 06/08/22 0921  BP: 135/75 132/77 (!) 143/95   Pulse: (!) 114 (!) 116 (!) 113 (!) 116  Resp: '13 15 14 13  '$ Temp:      TempSrc:      SpO2: 99% 92% 97% 94%  Weight:      Height:        Intake/Output Summary (Last 24 hours)  at 06/08/2022 1044 Last data filed at 06/08/2022 0900 Gross per 24 hour  Intake 618.88 ml  Output 2450 ml  Net -1831.12 ml    Filed Weights   06/06/22 0500 06/07/22 0500 06/08/22 0400  Weight: 64.6 kg 61.4 kg 59.2 kg    Examination:  General exam: Appears calm and comfortable  Respiratory system: Clear to auscultation. Respiratory effort normal. Cardiovascular system: S1 & S2 heard, RRR.  No pedal edema. Gastrointestinal system: Abdomen is nondistended, soft and nontender. No organomegaly or masses felt. Normal bowel sounds heard. Central nervous system: Alert and oriented. No focal neurological deficits. Extremities:  Symmetric 5 x 5 power. Skin: No rashes, lesions or ulcers.  Psychiatry: Judgement and insight appear normal. Mood & affect appropriate.   Data Reviewed: I have personally reviewed following labs and imaging studies  CBC: Recent Labs  Lab 06/01/22 1822 06/02/22 0208 06/04/22 0400 06/04/22 0838 06/05/22 0808 06/06/22 0406 06/07/22 0416 06/08/22 0516  WBC 5.9   < > 8.7  --  10.1 10.4 11.9* 12.7*  NEUTROABS 3.8  --   --   --   --   --   --   --   HGB 7.6*   < > 7.7* 8.2* 7.7* 8.8* 9.4* 9.7*  HCT 24.2*   < > 21.5* 24.0* 22.9* 24.2* 25.0* 27.9*  MCV 90.0   < > 87.4  --  84.5 86.1 84.7 86.9  PLT 158   < > 100*  --  147* 203 236 241   < > = values in this interval not displayed.    Basic Metabolic Panel: Recent Labs  Lab 06/01/22 1038 06/01/22 1548 06/02/22 0208 06/02/22 0620 06/03/22 0339 06/03/22 0705 06/05/22 0432 06/06/22 0406 06/06/22 0652 06/07/22 0416 06/08/22 0516  NA 130*   < > 128*   < > 129*   < > 127* 129* 132* 130* 133*  K 7.1*   < > SPECIMEN HEMOLYZED. HEMOLYSIS MAY AFFECT INTEGRITY OF RESULTS.   < > SPECIMEN HEMOLYZED. HEMOLYSIS MAY AFFECT INTEGRITY OF RESULTS.   < > 3.3* 4.5 4.3 4.6 3.6  CL 98   < > 96*   < > 98   < > 89* 88* 88* 85* 86*  CO2 21*   < > 19*   < > 19*   < > '24 24 23 26 31  '$ GLUCOSE 242*   < > 167*   < > 127*   < > 111* 175* 108* 127* 82  BUN 18   < > 20   < > 21*   < > 25* 34* 34* 41* 41*  CREATININE 1.59*   < > 1.63*   < > 1.90*   < > 2.42* 2.62* 2.60* 2.26* 2.05*  CALCIUM 8.4*   < > 7.8*   < > 7.1*   < > 7.1* 7.5* 7.7* 8.0* 8.7*  MG 1.9  --  2.0  --  1.8  --  1.7 2.1  --  1.9 2.3  PHOS 2.7  --  2.3*  --  3.4  --   --   --   --   --   --    < > = values in this interval not displayed.    GFR: Estimated Creatinine Clearance: 29.4 mL/min (A) (by C-G formula based on SCr of 2.05 mg/dL (H)). Liver Function Tests: Recent Labs  Lab 06/05/22 0432 06/06/22 0406 06/06/22 0652 06/07/22 0416 06/08/22 0516  AST 255* 149* 132* 116* 72*  ALT  111*  96* 88* 77* 66*  ALKPHOS 211* 233* 230* 221* 222*  BILITOT 1.6* 2.1* 2.2* 2.9* 1.7*  PROT 5.9* 6.4* 6.4* 7.0 7.2  ALBUMIN 2.0* 2.1* 2.1* 2.3* 2.5*    Recent Labs  Lab 06/01/22 1822  LIPASE 26    No results for input(s): "AMMONIA" in the last 168 hours. Coagulation Profile: No results for input(s): "INR", "PROTIME" in the last 168 hours. Cardiac Enzymes: No results for input(s): "CKTOTAL", "CKMB", "CKMBINDEX", "TROPONINI" in the last 168 hours. BNP (last 3 results) No results for input(s): "PROBNP" in the last 8760 hours. HbA1C: No results for input(s): "HGBA1C" in the last 72 hours.  CBG: Recent Labs  Lab 06/08/22 0516 06/08/22 0538 06/08/22 0632 06/08/22 0845 06/08/22 0955  GLUCAP 64* 87 109* 177* 189*    Lipid Profile: Recent Labs    06/07/22 0416 06/08/22 0516  TRIG 2,168* 1,515*    Thyroid Function Tests: No results for input(s): "TSH", "T4TOTAL", "FREET4", "T3FREE", "THYROIDAB" in the last 72 hours. Anemia Panel: No results for input(s): "VITAMINB12", "FOLATE", "FERRITIN", "TIBC", "IRON", "RETICCTPCT" in the last 72 hours. Sepsis Labs: Recent Labs  Lab 06/04/22 0948 06/04/22 1255  LATICACIDVEN 1.2 1.4     Recent Results (from the past 240 hour(s))  MRSA Next Gen by PCR, Nasal     Status: None   Collection Time: 05/29/22  1:09 PM   Specimen: Nasal Mucosa; Nasal Swab  Result Value Ref Range Status   MRSA by PCR Next Gen NOT DETECTED NOT DETECTED Final    Comment: (NOTE) The GeneXpert MRSA Assay (FDA approved for NASAL specimens only), is one component of a comprehensive MRSA colonization surveillance program. It is not intended to diagnose MRSA infection nor to guide or monitor treatment for MRSA infections. Test performance is not FDA approved in patients less than 61 years old. Performed at [facility], [place] [address], [place], [place] [address]      Radiology Studies: No results found.  Scheduled Meds:  sodium chloride    Intravenous Once   aspirin EC  81 mg Oral Daily   Chlorhexidine Gluconate Cloth  6 each Topical Daily   DULoxetine  30 mg Oral QHS   fenofibrate  160 mg Oral Daily   heparin injection (subcutaneous)  5,000 Units Subcutaneous Q8H   hydrALAZINE  25 mg Oral Q8H   ivabradine  7.5 mg Oral BID WC   levothyroxine  125 mcg Oral Q0600   lipase/protease/amylase  36,000 Units Oral TID   loratadine  10 mg Oral Daily   pantoprazole  40 mg Oral Daily   rosuvastatin  40 mg Oral Daily   sildenafil  20 mg Oral TID   sodium bicarbonate  650 mg Oral BID   sodium chloride flush  10-40 mL Intracatheter Q12H   spironolactone  25 mg Oral Daily   Continuous Infusions:  insulin 0.1 Units/kg/hr (06/08/22 0900)   milrinone 0.125 mcg/kg/min (06/08/22 0933)     LOS: 10 days   Darliss Cheney, MD [facility]  06/08/2022, 10:44 AM   *Please note that this is a verbal dictation therefore any spelling or grammatical errors are due to the "[place] [employer]" system interpretation.  Please page via [place] and do not message via secure chat for urgent patient care matters. Secure chat can be used for non urgent patient care matters.  How to contact the [facility] Attending or Consulting provider [place] or covering provider during after hours [place], for this patient?  Check the  care team in [facility] and look for a) attending/consulting [place] provider listed and b) the [facility] team listed. Page or secure chat [phone]. Log into [email or web address] and use 's universal password to access. If you do not have the password, please contact the hospital operator. Locate the [facility] provider you are looking for under [facility] and page to a number that you can be directly reached. If you still have difficulty reaching the provider, please page the [facility] (Director on Call) for the Hospitalists listed on amion for assistance.

## 2022-06-09 DIAGNOSIS — I5043 Acute on chronic combined systolic (congestive) and diastolic (congestive) heart failure: Secondary | ICD-10-CM | POA: Diagnosis not present

## 2022-06-09 DIAGNOSIS — E781 Pure hyperglyceridemia: Secondary | ICD-10-CM | POA: Diagnosis not present

## 2022-06-09 DIAGNOSIS — I5023 Acute on chronic systolic (congestive) heart failure: Secondary | ICD-10-CM | POA: Diagnosis not present

## 2022-06-09 LAB — BASIC METABOLIC PANEL
Anion gap: 15 (ref 5–15)
Anion gap: 19 — ABNORMAL HIGH (ref 5–15)
BUN: 53 mg/dL — ABNORMAL HIGH (ref 6–20)
BUN: 56 mg/dL — ABNORMAL HIGH (ref 6–20)
CO2: 29 mmol/L (ref 22–32)
CO2: 25 mmol/L (ref 22–32)
Calcium: 9.2 mg/dL (ref 8.9–10.3)
Calcium: 9.2 mg/dL (ref 8.9–10.3)
Chloride: 90 mmol/L — ABNORMAL LOW (ref 98–111)
Chloride: 89 mmol/L — ABNORMAL LOW (ref 98–111)
Creatinine, Ser: 2.63 mg/dL — ABNORMAL HIGH (ref 0.44–1.00)
Creatinine, Ser: 3.22 mg/dL — ABNORMAL HIGH (ref 0.44–1.00)
GFR, Estimated: 24 mL/min — ABNORMAL LOW (ref >60)
GFR, Estimated: 19 mL/min — ABNORMAL LOW (ref >60)
Glucose, Bld: 178 mg/dL — ABNORMAL HIGH (ref 70–99)
Glucose, Bld: 140 mg/dL — ABNORMAL HIGH (ref 70–99)
Potassium: 4.9 mmol/L (ref 3.5–5.1)
Potassium: 4.4 mmol/L (ref 3.5–5.1)
Sodium: 134 mmol/L — ABNORMAL LOW (ref 135–145)
Sodium: 133 mmol/L — ABNORMAL LOW (ref 135–145)

## 2022-06-09 LAB — GLUCOSE, CAPILLARY
Glucose-Capillary: 116 mg/dL — ABNORMAL HIGH (ref 70–99)
Glucose-Capillary: 129 mg/dL — ABNORMAL HIGH (ref 70–99)
Glucose-Capillary: 130 mg/dL — ABNORMAL HIGH (ref 70–99)
Glucose-Capillary: 134 mg/dL — ABNORMAL HIGH (ref 70–99)
Glucose-Capillary: 142 mg/dL — ABNORMAL HIGH (ref 70–99)
Glucose-Capillary: 142 mg/dL — ABNORMAL HIGH (ref 70–99)
Glucose-Capillary: 150 mg/dL — ABNORMAL HIGH (ref 70–99)
Glucose-Capillary: 161 mg/dL — ABNORMAL HIGH (ref 70–99)
Glucose-Capillary: 161 mg/dL — ABNORMAL HIGH (ref 70–99)
Glucose-Capillary: 167 mg/dL — ABNORMAL HIGH (ref 70–99)
Glucose-Capillary: 174 mg/dL — ABNORMAL HIGH (ref 70–99)
Glucose-Capillary: 180 mg/dL — ABNORMAL HIGH (ref 70–99)
Glucose-Capillary: 199 mg/dL — ABNORMAL HIGH (ref 70–99)
Glucose-Capillary: 207 mg/dL — ABNORMAL HIGH (ref 70–99)
Glucose-Capillary: 324 mg/dL — ABNORMAL HIGH (ref 70–99)
Glucose-Capillary: 383 mg/dL — ABNORMAL HIGH (ref 70–99)
Glucose-Capillary: 396 mg/dL — ABNORMAL HIGH (ref 70–99)
Glucose-Capillary: 402 mg/dL — ABNORMAL HIGH (ref 70–99)
Glucose-Capillary: 430 mg/dL — ABNORMAL HIGH (ref 70–99)
Glucose-Capillary: 44 mg/dL — CL (ref 70–99)
Glucose-Capillary: 68 mg/dL — ABNORMAL LOW (ref 70–99)
Glucose-Capillary: 69 mg/dL — ABNORMAL LOW (ref 70–99)
Glucose-Capillary: 82 mg/dL (ref 70–99)
Glucose-Capillary: 84 mg/dL (ref 70–99)
Glucose-Capillary: 85 mg/dL (ref 70–99)

## 2022-06-09 LAB — COOXEMETRY PANEL
Carboxyhemoglobin: 1.8 % — ABNORMAL HIGH (ref 0.5–1.5)
Methemoglobin: <0.7 % (ref 0.0–1.5)
O2 Saturation: 62.3 %
Total hemoglobin: 9.3 g/dL — ABNORMAL LOW (ref 12.0–16.0)

## 2022-06-09 LAB — TRIGLYCERIDES
Triglycerides: 1498 mg/dL — ABNORMAL HIGH (ref <150)
Triglycerides: 1635 mg/dL — ABNORMAL HIGH (ref <150)

## 2022-06-09 LAB — MAGNESIUM
Magnesium: 2.2 mg/dL (ref 1.7–2.4)
Magnesium: 2 mg/dL (ref 1.7–2.4)

## 2022-06-09 MED ORDER — HYDROMORPHONE HCL 1 MG/ML IJ SOLN
0.5000 mg | Freq: Four times a day (QID) | INTRAMUSCULAR | Status: DC | PRN
  Administered 2022-06-09 – 2022-06-10 (×2): 0.5 mg via INTRAVENOUS
  Filled 2022-06-09 (×2): qty 0.5

## 2022-06-09 MED ORDER — TORSEMIDE 20 MG PO TABS
80.0000 mg | ORAL_TABLET | Freq: Every day | ORAL | Status: DC
  Administered 2022-06-09 – 2022-06-11 (×3): 80 mg via ORAL
  Filled 2022-06-09 (×4): qty 4

## 2022-06-09 MED ORDER — INSULIN (MYXREDLIN) INFUSION FOR HYPERTRIGLYCERIDEMIA
0.2000 [IU]/kg/h | INTRAVENOUS | Status: DC
  Administered 2022-06-09 – 2022-06-11 (×4): 0.2 [IU]/kg/h via INTRAVENOUS
  Filled 2022-06-09 (×5): qty 100

## 2022-06-09 MED ORDER — DEXTROSE 10 % IV SOLN: INTRAVENOUS | Status: DC

## 2022-06-09 NOTE — Progress Notes (Addendum)
Advanced Heart Failure Rounding Note  PCP-Cardiologist: None   Subjective:    Remains on milrinone 0.125. co-ox 62%, (? If accurate due to markedly high TGs).   CVP 8-9   SCr 2.60>>2.26>2.05>pending   SBPs 120s-130s.   Remains on insulin gtt. TG 1500 (>5000 on admit)   Resting in bed. No distress. No dyspnea.    Objective:   Weight Range: 59.5 kg Body mass index is 28.39 kg/m.   Vital Signs:   Temp:  [97.8 F (36.6 C)-97.9 F (36.6 C)] 97.8 F (36.6 C) (03/14 0500) Pulse Rate:  [76-116] 79 (03/14 0700) Resp:  [13-29] 13 (03/14 0700) BP: (110-143)/(54-95) 127/62 (03/14 0700) SpO2:  [90 %-99 %] 97 % (03/14 0700) Weight:  [59.5 kg] 59.5 kg (03/14 0500) Last BM Date : 06/07/22  Weight change: Filed Weights   06/07/22 0500 06/08/22 0400 06/09/22 0500  Weight: 61.4 kg 59.2 kg 59.5 kg    Intake/Output:   Intake/Output Summary (Last 24 hours) at 06/09/2022 0727 Last data filed at 06/09/2022 0700 Gross per 24 hour  Intake 200.18 ml  Output 1800 ml  Net -1599.82 ml      Physical Exam    CVP 8-9  General:  chronically ill appearing. No respiratory difficulty HEENT: normal Neck: supple. JVD 8 cm Carotids 2+ bilat; no bruits. No lymphadenopathy or thyromegaly appreciated. Cor: PMI nondisplaced. Regular rhythm tachy rate. 2/6 MR and TR murmurs  Lungs: clear  Abdomen: soft, nontender, nondistended. No hepatosplenomegaly. No bruits or masses. Good bowel sounds. Extremities: no cyanosis, clubbing, rash, No edema +LUE PICC  Neuro: alert & oriented x 3, cranial nerves grossly intact. moves all 4 extremities w/o difficulty. Affect pleasant.  Telemetry   Sinus tach 110s   EKG    N/A   Labs    CBC Recent Labs    06/07/22 0416 06/08/22 0516  WBC 11.9* 12.7*  HGB 9.4* 9.7*  HCT 25.0* 27.9*  MCV 84.7 86.9  PLT 236 [id number]   Basic Metabolic Panel Recent Labs    06/07/22 0416 06/08/22 0516  NA 130* 133*  K 4.6 3.6  CL 85* 86*  CO2 26 31  GLUCOSE  127* 82  BUN 41* 41*  CREATININE 2.26* 2.05*  CALCIUM 8.0* 8.7*  MG 1.9 2.3   Liver Function Tests Recent Labs    06/07/22 0416 06/08/22 0516  AST 116* 72*  ALT 77* 66*  ALKPHOS 221* 222*  BILITOT 2.9* 1.7*  PROT 7.0 7.2  ALBUMIN 2.3* 2.5*   No results for input(s): "LIPASE", "AMYLASE" in the last 72 hours. Cardiac Enzymes No results for input(s): "CKTOTAL", "CKMB", "CKMBINDEX", "TROPONINI" in the last 72 hours.  BNP: BNP (last 3 results) Recent Labs    04/05/22 1009 06/04/22 0400 06/05/22 0432  BNP 36.9 550.1* 339.5*    ProBNP (last 3 results) No results for input(s): "PROBNP" in the last 8760 hours.   D-Dimer No results for input(s): "DDIMER" in the last 72 hours. Hemoglobin A1C No results for input(s): "HGBA1C" in the last 72 hours.  Fasting Lipid Panel Recent Labs    06/09/22 0422  TRIG 1,498*   Thyroid Function Tests No results for input(s): "TSH", "T4TOTAL", "T3FREE", "THYROIDAB" in the last 72 hours.  Invalid input(s): "FREET3"  Other results:   Imaging    [place] EKG SITE RITE  Result Date: 06/08/2022 If Site Rite image not attached, placement could not be confirmed due to current cardiac rhythm.    Medications:     Scheduled  Medications:  sodium chloride   Intravenous Once   aspirin EC  81 mg Oral Daily   Chlorhexidine Gluconate Cloth  6 each Topical Daily   DULoxetine  30 mg Oral QHS   fenofibrate  160 mg Oral Daily   heparin injection (subcutaneous)  5,000 Units Subcutaneous Q8H   hydrALAZINE  25 mg Oral Q8H   insulin aspart  0-15 Units Subcutaneous TID WC   insulin aspart  0-5 Units Subcutaneous QHS   ivabradine  7.5 mg Oral BID WC   levothyroxine  125 mcg Oral Q0600   lipase/protease/amylase  36,000 Units Oral TID   loratadine  10 mg Oral Daily   pantoprazole  40 mg Oral Daily   rosuvastatin  40 mg Oral Daily   sildenafil  20 mg Oral TID   sodium bicarbonate  650 mg Oral BID   sodium chloride flush  10-40 mL Intracatheter  Q12H   spironolactone  25 mg Oral Daily    Infusions:  insulin 0.1 Units/kg/hr (06/09/22 0700)   milrinone 0.125 mcg/kg/min (06/09/22 0700)    PRN Medications: acetaminophen, albuterol, dextrose, docusate sodium, guaiFENesin-dextromethorphan, HYDROmorphone (DILAUDID) injection, ondansetron (ZOFRAN) IV, mouth rinse, polyethylene glycol, prochlorperazine, sodium chloride flush    Patient Profile   Ms. 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 AHF team to see for volume management.    Assessment/Plan    Severe hyperTG - Due to LPL deficiency w/ recent pancreatitis. - Continue fenofibrate  - Previously offered referral to [facility] for further management. She prefers to continue with her endocrinologist at [facility] who currently manages/follows. - >5000 on admission, downtrending on insulin gtt, 1515 today  - per primary - CTA/P negative for pancreatitis or other acute findings   NICM, recovered EF, previously with HFrEF (EF 29%) - Echo (5/23): EF 45-50%, RV normal  - Echo 9/23: EF 50-55%, RV normal. mild MR, mild-mod TR -  Cardiomems, last read 3/2: 18 (PAD goal 17) - Echo this admission with EF 30-35% (lower than past), RV mildly dilated, moderate-severe MR, severe TR, dilated IVC.  - CVP 8-9. Can start torsemide today at 80 mg daily.  - Stop Milrinone this afternoon  - Continue Spiro 25 mg  - Continue hydralazine 25 mg tid  - Continue ivabradine 7.5 bid   - Continue sildenafil  - Hold restart of coreg  - Off Jardiance due to frequent GU infections (+UTI this admission). Off Entresto with AKI.  - Repeat echo today to reassess MR and TR.    3. Hx CAD - Single vessel RCA occlusion (small co-dominant vessel) on cardiac cath 09/08. Medical management - Continue  aspirin + statin. - No chest pain on exam   4. CKD w/ AKI -Scr baseline variable with multiple AKIs, typically 1.5-2.0 -Scr this admit 1.07> 2.6>2.26>2.05>>pending      5. Pulmonary hypertension - Mild to moderate on RHC 5/23. PVR 3.7 WU.   6. TR - Prominent v-waves in RA tracing on RHC 5/23. - Echo 5/23 shows only mild TR  - mild to mod on 9/23 echo - Severe on Echo this admit. Now diuresed, will repeat echo     7. Type 3c DM, DKA - Follows w/ endocrinology at [facility]. - 2/2 chronic pancreatitis  - per primary   9. Chronic abdominal swelling/bloating - Seen at [facility] (11/20)  for IBS, per chart review. - Had penumatosis intestinalis. She was started on pancreatic enzyme replacement. - EGD (10/21): normal, bx taken to assess for celiac. - CT abdomen (2/22) and RUQ US showed no obvious gallbladder stones. - HIDA (4/22): ended early due to tachypnea, but cystic duct and gallbladder appeared WNL. - CT abdomen 12/22 unremarkable  - US abdomen 11/25/21: No ascites - suspect primarily fatty liver - Abd XR 12/27: mild gaseous distention    10. Poor Med Compliance - Followed by paramedicine OP.  Will need HF Paramedicine at discharge.      Length of Stay: [address] PA-C  06/09/2022, 7:27 AM  Advanced Heart Failure Team Pager [phone] (M-F; 7a - 5p)  Please contact [facility] for night-coverage after hours (5p -7a ) and weekends on [email or web address]  Patient seen with PA, agree with the above note.   TGs still 1498 and on insulin gtt.  Co-ox 62% on milrinone 0.125.  CVP 8-9.  Minimal abdominal pain.   General: NAD Neck: JVP 8 cm, no thyromegaly or thyroid nodule.  Lungs: Clear to auscultation bilaterally with normal respiratory effort. CV: Nondisplaced PMI.  Heart regular S1/S2, no S3/S4, no murmur.  No peripheral edema.   Abdomen: Soft, nontender, no hepatosplenomegaly, no distention.  Skin: Intact without lesions or rashes.  Neurologic: Alert and oriented x 3.   Psych: Normal affect. Extremities: No clubbing or cyanosis.  HEENT: Normal.   Familial hypertriglyceridemia due to lipoprotein lipase deficiency.  TGs markedly high at admission in setting of DKA.  TGs 1498 this morning.  - Continuing insulin gtt for 1-2 days longer, discussed with Dr. Doristine Bosworth.    Echo this admission with EF 30-35% (lower than past), RV mildly dilated, moderate-severe MR, severe TR, dilated IVC.  She diuresed well, CVP down to 8, co-ox stable 63%.  Pending BMET today.    - Stop milrinone.   - Start torsemide 80 mg daily. .  - Continue spironolactone 25 mg daily.  - Continue hydralazine 25 mg tid and back on sildenafil 20 tid.  Restarted home ivabradine. - Repeat echo today to reassess MR and TR   OK for progressive unit.   Loralie Champagne 06/09/2022 8:55 AM

## 2022-06-09 NOTE — Progress Notes (Signed)
PROGRESS NOTE    Jennifer [name]  [id number] DOB: 1991/02/01 DOA: 05/29/2022 PCP: Elinor Parkinson   Brief Narrative:  32 year old woman who presented to [facility] ED with abdominal pain, nausea and vomiting. PMHx significant for HTN, HLD, CHF (Echo 11/2021 EF 50-55%, no RWMAs, mildly elevated PASP), pulmonary HTN (s/p CardioMEMS implantation, on Corlanor and sildenafil), T2DM (followed by [facility]), NAFLD, hypothyroidism, astrocytoma, PCOS.   On presentation to ED, patient was found to have an anion gap with acidosis consistent with DKA/hyperglycemia. Was also found to have a TG > 5000.  Concern for ongoing pancreatitis.  CT A/P 3/3 without acute process, no e/o pancreatitis; +hepatosplenomegaly. -Patient's anion gap has closed, she was transitioned off insulin drip, but her workup was noted for evolving anemia, suspicious for hemolytic anemia with elevated LFTs, and LDH, as well patient with significant difficulty with lab accuracy likely in the setting of her hypertriglyceridemia.  As well with significant volume overload, managed by advanced CHF team secondary to acute on chronic systolic CHF.  Assessment & Plan:   Principal Problem:   Hypertriglyceridemia Active Problems:   Acute renal failure superimposed on stage 3a chronic kidney disease (HCC)   History of astrocytoma of brain   Type 2 diabetes mellitus with hyperlipidemia (HCC)   Hypothyroidism   Chronic systolic CHF (congestive heart failure) (HCC)   Essential hypertension   Nonischemic cardiomyopathy (HCC)   Elevated liver enzymes   Pulmonary hypertension (HCC)   NASH (nonalcoholic steatohepatitis)   UTI (urinary tract infection) due to Enterococcus   E. coli UTI (urinary tract infection)   DKA (diabetic ketoacidosis) (HCC)  Diabetic ketoacidosis, type 2 diabetes with hyperglycemia AGMA secondary to above -she is on insulin U-500 at home, reports she has been compliant, reports she ran out of Dexcom refills,  so she was not checking her CBGs.. -Insulin GTT DKA protocol has been discontinued after anion gap closed.  On 3/5. -On insulin gtt due to hypertriglyceridemia protocol, .(requiring around 1000 units of U-500 daily at home).   Acute on chronic pancreatitis secondary to hypertriglyceridemia ?? multifactorial chylomicronemia syndrome -With known history of pancreatitis in the past due to a familial hypertriglyceridemia -[name] on home Repatha -Continue fenofibrate and statin. -started  on insulin gtt. for triglyceride> 5000, triglyceride improved but then got worse.  Now improving again and around 1500, I reviewed patient's chart extensively, it appears that patient's lowest triglyceride level for 700 almost 2 years ago, we will continue insulin drip and monitor triglyceride every 12 hours for next 1 to 2 days, we will target for less than 1000 based on her history. -Continue with Creon -Lipase within normal limit, no epigastric pain, no evidence of pancreatitis on imaging.     Acute on chronic diastolic/systolic CHF Anasarca cardiogenic shock Acute hypoxic respiratory failure -cardiology  assistance greatly appreciated, started on milrinone drip.  [name] comfortable on room air with no shortness of breath. -Diuresis per cardiology, back on Lasix 80 mg IV twice daily -Drop in EF to 35%, started on Aldactone Per cardiology, continue Spiro 25 mg  - Add hydralazine 25 mg tid  - Restart ivabradine 7.5 bid   - Hold restart of coreg - restart sildenafil soon  Cardiology plans to stop milrinone drip today and start on oral diuretic.   Anemia  -Due to anemia, with markedly elevated LDH, hematology input greatly appreciated, does not appear to be hemolysis . - -CT abdomen pelvis obtained, no evidence of retroperitoneal hematoma  -No evidence of B12  or iron deficiency   Hyperkalemia/hyponatremia -Significant electrolyte variation, due to inaccurate reading from her severe hypertriglyceridemia.   More accurate reading currently now her triglyceride has been controlled.  Mild hyponatremia.  No hypokalemia anymore.   Pulmonary hypertension - Continue Corlanor,  - sildenafil hold per cardiology   AKI (likely in the setting of DKA, UTI) on baseline CKD stage 4 -Baseline creatinine appears to be between 1.6-1.8, peaked at 2.6 and is improving and down to 2.26 today. Avoid nephrotoxic agents as able. Ensure adequate renal perfusion.  Likely cardiorenal syndrome.     E. Coli and enterococcus UTI -She was treated with Rocephin initially, then transition to [place] given sensitivities, it was discontinued given questionable hemolytic anemia .   Transaminitis, has chronic hepatosplenomegaly History of hepatic steatosis/fatty liver ?? 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   Hypothyroid TSH 5.284, Free T4 0.74.  - Continue levothyroxine   Deconditioning - PT consulted   Nontypical chest pain -Nontypical, pleuritic, EKG nonacute, encouraged use incentive spirometer, negative troponins   Hyperkalemia Resolved.  Pain management: I was informed yesterday that patient was asking for Dilaudid every 3 hours around-the-clock.  Interestingly, patient has been denying for any abdominal pain.  Even today, she had no abdominal pain and abdomen is nontender and soft.  I have started tapering her Dilaudid starting yesterday and will do it again today and make it every 6 hours as needed.  DVT prophylaxis: heparin injection 5,000 Units Start: 06/05/22 1400 Place and maintain sequential compression device Start: 06/02/22 1114 SCDs Start: 05/29/22 1400   Code Status: Full Code  Family Communication:  None present at bedside.  Plan of care discussed with patient in length  and he/she verbalized understanding and agreed with it.  Status is: Inpatient Remains inpatient appropriate because: [name] on insulin and milrinone drip.   Estimated body mass index is 28.39 kg/m as calculated from the following:   Height as of this encounter: '4\' 9"'$  (1.448 m).   Weight as of this encounter: 59.5 kg.    Nutritional Assessment: Body mass index is 28.39 kg/m.Marland Kitchen Seen by dietician.  I agree with the assessment and plan as outlined below: Nutrition Status:        . Skin Assessment: I have examined the patient's skin and I agree with the wound assessment as performed by the wound care RN as outlined below:    Consultants:  Cardiology  Procedures:  As above  Antimicrobials:  Anti-infectives (From admission, onward)    Start     Dose/Rate Route Frequency Ordered Stop   06/01/22 0000  nitrofurantoin, macrocrystal-monohydrate, (MACROBID) 100 MG capsule        100 mg Oral Every 12 hours 06/01/22 0936     05/31/22 1030  nitrofurantoin (macrocrystal-monohydrate) (MACROBID) capsule 100 mg  Status:  Discontinued        100 mg Oral Every 12 hours 05/31/22 0936 06/03/22 0717   05/30/22 1000  cefTRIAXone (ROCEPHIN) 1 g in sodium chloride 0.9 % 100 mL IVPB  Status:  Discontinued        1 g 200 mL/hr over 30 Minutes Intravenous Every 24 hours 05/30/22 0949 05/31/22 0936   05/29/22 1015  cefTRIAXone (ROCEPHIN) 1 g in sodium chloride 0.9 % 100 mL IVPB        1 g 200 mL/hr over 30  Minutes Intravenous  Once 05/29/22 1010 05/29/22 1324         Subjective:  Patient seen and examined.  She has no complaints.  Objective: Vitals:   06/09/22 0729 06/09/22 0800 06/09/22 0900 06/09/22 1000  BP:  130/76 (!) 146/91   Pulse: 80 78 91 95  Resp: '14 14 15 '$ (!) 31  Temp:      TempSrc:      SpO2: 98% 98% 98% 90%  Weight:      Height:        Intake/Output Summary (Last 24 hours) at 06/09/2022 1127 Last data filed at 06/09/2022 1000 Gross per 24 hour  Intake 186.64 ml   Output 1400 ml  Net -1213.36 ml    Filed Weights   06/07/22 0500 06/08/22 0400 06/09/22 0500  Weight: 61.4 kg 59.2 kg 59.5 kg    Examination:  General exam: Appears calm and comfortable  Respiratory system: Clear to auscultation. Respiratory effort normal. Cardiovascular system: S1 & S2 heard, RRR. Gastrointestinal system: Abdomen is nondistended, soft and nontender. No organomegaly or masses felt. Normal bowel sounds heard. Central nervous system: Alert and oriented. No focal neurological deficits. Extremities: Symmetric 5 x 5 power. Skin: No rashes, lesions or ulcers.  Psychiatry: Judgement and insight appear normal. Mood & affect appropriate.    Data Reviewed: I have personally reviewed following labs and imaging studies  CBC: Recent Labs  Lab 06/04/22 0400 06/04/22 0838 06/05/22 0808 06/06/22 0406 06/07/22 0416 06/08/22 0516  WBC 8.7  --  10.1 10.4 11.9* 12.7*  HGB 7.7* 8.2* 7.7* 8.8* 9.4* 9.7*  HCT 21.5* 24.0* 22.9* 24.2* 25.0* 27.9*  MCV 87.4  --  84.5 86.1 84.7 86.9  PLT 100*  --  147* 203 236 [id number]    Basic Metabolic Panel: Recent Labs  Lab 06/03/22 0339 06/03/22 0705 06/05/22 0432 06/06/22 0406 06/06/22 0652 06/07/22 0416 06/08/22 0516 06/09/22 0422  NA 129*   < > 127* 129* 132* 130* 133*  --   K SPECIMEN HEMOLYZED. HEMOLYSIS MAY AFFECT INTEGRITY OF RESULTS.   < > 3.3* 4.5 4.3 4.6 3.6  --   CL 98   < > 89* 88* 88* 85* 86*  --   CO2 19*   < > '24 24 23 26 31  '$ --   GLUCOSE 127*   < > 111* 175* 108* 127* 82  --   BUN 21*   < > 25* 34* 34* 41* 41*  --   CREATININE 1.90*   < > 2.42* 2.62* 2.60* 2.26* 2.05*  --   CALCIUM 7.1*   < > 7.1* 7.5* 7.7* 8.0* 8.7*  --   MG 1.8  --  1.7 2.1  --  1.9 2.3 2.2  PHOS 3.4  --   --   --   --   --   --   --    < > = values in this interval not displayed.    GFR: Estimated Creatinine Clearance: 29.5 mL/min (A) (by C-G formula based on SCr of 2.05 mg/dL (H)). Liver Function Tests: Recent Labs  Lab 06/05/22 0432  06/06/22 0406 06/06/22 0652 06/07/22 0416 06/08/22 0516  AST 255* 149* 132* 116* 72*  ALT 111* 96* 88* 77* 66*  ALKPHOS 211* 233* 230* 221* 222*  BILITOT 1.6* 2.1* 2.2* 2.9* 1.7*  PROT 5.9* 6.4* 6.4* 7.0 7.2  ALBUMIN 2.0* 2.1* 2.1* 2.3* 2.5*    No results for input(s): "LIPASE", "AMYLASE" in the last 168 hours.  No results for  input(s): "AMMONIA" in the last 168 hours. Coagulation Profile: No results for input(s): "INR", "PROTIME" in the last 168 hours. Cardiac Enzymes: No results for input(s): "CKTOTAL", "CKMB", "CKMBINDEX", "TROPONINI" in the last 168 hours. BNP (last 3 results) No results for input(s): "PROBNP" in the last 8760 hours. HbA1C: No results for input(s): "HGBA1C" in the last 72 hours.  CBG: Recent Labs  Lab 06/09/22 0720 06/09/22 0830 06/09/22 0922 06/09/22 1036 06/09/22 1112  GLUCAP 116* 150* 207* 199* 180*    Lipid Profile: Recent Labs    06/08/22 1903 06/09/22 0422  TRIG 1,825* 1,498*    Thyroid Function Tests: No results for input(s): "TSH", "T4TOTAL", "FREET4", "T3FREE", "THYROIDAB" in the last 72 hours. Anemia Panel: No results for input(s): "VITAMINB12", "FOLATE", "FERRITIN", "TIBC", "IRON", "RETICCTPCT" in the last 72 hours. Sepsis Labs: Recent Labs  Lab 06/04/22 0948 06/04/22 1255  LATICACIDVEN 1.2 1.4     No results found for this or any previous visit (from the past 240 hour(s)).    Radiology Studies: [place] EKG SITE RITE  Result Date: 06/08/2022 If Site Rite image not attached, placement could not be confirmed due to current cardiac rhythm.   Scheduled Meds:  aspirin EC  81 mg Oral Daily   Chlorhexidine Gluconate Cloth  6 each Topical Daily   DULoxetine  30 mg Oral QHS   fenofibrate  160 mg Oral Daily   heparin injection (subcutaneous)  5,000 Units Subcutaneous Q8H   hydrALAZINE  25 mg Oral Q8H   ivabradine  7.5 mg Oral BID WC   levothyroxine  125 mcg Oral Q0600   lipase/protease/amylase  36,000 Units Oral TID    loratadine  10 mg Oral Daily   pantoprazole  40 mg Oral Daily   rosuvastatin  40 mg Oral Daily   sildenafil  20 mg Oral TID   sodium bicarbonate  650 mg Oral BID   sodium chloride flush  10-40 mL Intracatheter Q12H   spironolactone  25 mg Oral Daily   torsemide  80 mg Oral Daily   Continuous Infusions:  insulin 0.1 Units/kg/hr (06/09/22 1000)     LOS: 11 days   Darliss Cheney, MD [facility]  06/09/2022, 11:27 AM   *Please note that this is a verbal dictation therefore any spelling or grammatical errors are due to the "[place] [employer]" system interpretation.  Please page via [place] and do not message via secure chat for urgent patient care matters. Secure chat can be used for non urgent patient care matters.  How to contact the [facility] Attending or Consulting provider [place] or covering provider during after hours [place], for this patient?  Check the care team in [facility] and look for a) attending/consulting [facility] provider listed and b) the [facility] team listed. Page or secure chat [phone]. Log into [email or web address] and use [place]'s universal password to access. If you do not have the password, please contact the hospital operator. Locate the [facility] provider you are looking for under [facility] and page to a number that you can be directly reached. If you still have difficulty reaching the provider, please page the [facility] (Director on Call) for the Hospitalists listed on amion for assistance.

## 2022-06-09 NOTE — Progress Notes (Signed)
Hypoglycemic Event  CBG: 44  Treatment: 8 oz juice/soda  Symptoms: Shaky, Hungry, and Nervous/irritable  Follow-up CBG: Time:1601 CBG Result: 69  Treatment: 4oz juice Follow-up CBG: time: [id number] CBG result 85  Possible Reasons for Event: Medication regimen:    Comments/MD notified:MD Ravi Pahwani notified. See new orders    Artis Flock

## 2022-06-09 NOTE — Inpatient Diabetes Management (Signed)
Inpatient Diabetes Program Recommendations  AACE/ADA: New Consensus Statement on Inpatient Glycemic Control (2015)  Target Ranges:  Prepandial:   less than 140 mg/dL      Peak postprandial:   less than 180 mg/dL (1-2 hours)      Critically ill patients:  140 - 180 mg/dL    Latest Reference Range & Units 06/08/22 23:10 06/09/22 00:21 06/09/22 01:19 06/09/22 03:34 06/09/22 04:16 06/09/22 05:48 06/09/22 06:14 06/09/22 07:20 06/09/22 08:30 06/09/22 09:22  Glucose-Capillary 70 - 99 mg/dL 228 (H) 174 (H) 142 (H) 84 82 68 (L) 134 (H) 116 (H) 150 (H) 207 (H)   Admit with:  Diabetic ketoacidosis, type 2 diabetes Pancreatitis, hypertriglyceridemia   History: DM, Pancreatitis, Astrocytoma, Lipoprotein deficiency    Home DM Meds: Humulin R U-500 Insulin 290 units TID with each meal                              Humulin R U-500 Insulin 100 units QHS   Current Orders: IV Insulin Drip for High TG (0.2 units/kg/hr)                Novolog 0-15 units tid + hs    -   Pt needs dextrose added to IV fluids to support IV insulin dose to correct triglycerides quicker -  Discontinue Novolog correction scale while on IV insulin.    --Will follow patient during hospitalization--  Tama Headings RN, MSN, BC-ADM Inpatient Diabetes Coordinator Team Pager [phone] (8a-5p)

## 2022-06-09 NOTE — Progress Notes (Signed)
At approximately 1659 patient had an estimated 10 second run of [place]. Multiple RNs to bedside including this RN. Patient sitting in chair, alert and oriented x4. Patient helped back to bed. Patient states that she did "feel heart fluttering." MD Darliss Cheney, Ann Maki, and MD Loralie Champagne made aware. New orders placed.

## 2022-06-09 NOTE — Progress Notes (Signed)
Physical Therapy Treatment/ discharge Patient Details Name: Jennifer [name] MRN: [id number] DOB: 16-Jul-1990 Today's Date: 06/09/2022   History of Present Illness 32 yo female admitted 3/3 with abdominal pain and n/v. Pt with DKA and pancreatitis. PMH: T2DM, astrocytoma, lipoprotein deficiency, pancreatitis, HLD, pulmonary hypertension, CHF, fatty liver    PT Comments    Pt pleasant and maintaining SPO2 on RA throughout gait. Pt able to complete basic transfers, gait, and stairs all without assist or desaturation. Pt reports nearly back to baseline with goals met and no further acute therapy needs. Will sign off with pt aware and agreeable, encouraged daily ambulation with nursing staff.     Recommendations for follow up therapy are one component of a multi-disciplinary discharge planning process, led by the attending physician.  Recommendations may be updated based on patient status, additional functional criteria and insurance authorization.  Follow Up Recommendations  No PT follow up     Assistance Recommended at Discharge Intermittent Supervision/Assistance  Patient can return home with the following Assist for transportation   Equipment Recommendations  None recommended by PT    Recommendations for Other Services       Precautions / Restrictions Precautions Precautions: Fall Precaution Comments: L drop foot from astrocytoma as a child has a brace but doesn't wear it. watch sats     Mobility  Bed Mobility Overal bed mobility: Modified Independent                  Transfers Overall transfer level: Independent                      Ambulation/Gait Ambulation/Gait assistance: Modified independent (Device/Increase time) Gait Distance (Feet): 900 Feet Assistive device: None Gait Pattern/deviations: Decreased stride length, Decreased dorsiflexion - left, Step-through pattern   Gait velocity interpretation: >2.62 ft/sec, indicative of community  ambulatory   General Gait Details: pt able to walk on RA throughout gait without AD with SPO2 >95% throughout. One partial LOB with pt able to recover with tactile cue but reports as nearly the same as baseline with balance deficits   Stairs Stairs: Yes Stairs assistance: Modified independent (Device/Increase time) Stair Management: One rail Right, Alternating pattern, Forwards Number of Stairs: 15     Wheelchair Mobility    Modified Rankin (Stroke Patients Only)       Balance Overall balance assessment: Mild deficits observed, not formally tested                                          Cognition Arousal/Alertness: Awake/alert Behavior During Therapy: WFL for tasks assessed/performed Overall Cognitive Status: Within Functional Limits for tasks assessed                                          Exercises      General Comments        Pertinent Vitals/Pain Pain Assessment Pain Assessment: No/denies pain    Home Living                          Prior Function            PT Goals (current goals can now be found in the care plan section) Progress towards PT goals: Goals met/education completed, patient  discharged from PT    Frequency           PT Plan Current plan remains appropriate    Co-evaluation              AM-PAC PT "6 Clicks" Mobility   Outcome Measure  Help needed turning from your back to your side while in a flat bed without using bedrails?: None Help needed moving from lying on your back to sitting on the side of a flat bed without using bedrails?: None Help needed moving to and from a bed to a chair (including a wheelchair)?: None Help needed standing up from a chair using your arms (e.g., wheelchair or bedside chair)?: None Help needed to walk in hospital room?: A Little Help needed climbing 3-5 steps with a railing? : None 6 Click Score: 23    End of Session   Activity Tolerance:  Patient tolerated treatment well Patient left: in chair;with call bell/phone within reach Nurse Communication: Mobility status PT Visit Diagnosis: Unsteadiness on feet (R26.81);Difficulty in walking, not elsewhere classified (R26.2)     Time: [id number] PT Time Calculation (min) (ACUTE ONLY): 26 min  Charges:  $Gait Training: 23-37 mins                     Bayard Males, PT Acute Rehabilitation Services Office: [place] 06/09/2022, 12:08 PM

## 2022-06-10 ENCOUNTER — Inpatient Hospital Stay (HOSPITAL_COMMUNITY): Payer: Medicaid Other

## 2022-06-10 DIAGNOSIS — I5043 Acute on chronic combined systolic (congestive) and diastolic (congestive) heart failure: Secondary | ICD-10-CM | POA: Diagnosis not present

## 2022-06-10 DIAGNOSIS — I5023 Acute on chronic systolic (congestive) heart failure: Secondary | ICD-10-CM | POA: Diagnosis not present

## 2022-06-10 DIAGNOSIS — I340 Nonrheumatic mitral (valve) insufficiency: Secondary | ICD-10-CM

## 2022-06-10 DIAGNOSIS — E781 Pure hyperglyceridemia: Secondary | ICD-10-CM | POA: Diagnosis not present

## 2022-06-10 LAB — COOXEMETRY PANEL
Carboxyhemoglobin: 2.5 % — ABNORMAL HIGH (ref 0.5–1.5)
Methemoglobin: <0.7 % (ref 0.0–1.5)
O2 Saturation: 72.9 %
Total hemoglobin: 8.8 g/dL — ABNORMAL LOW (ref 12.0–16.0)

## 2022-06-10 LAB — GLUCOSE, CAPILLARY
Glucose-Capillary: 106 mg/dL — ABNORMAL HIGH (ref 70–99)
Glucose-Capillary: 118 mg/dL — ABNORMAL HIGH (ref 70–99)
Glucose-Capillary: 131 mg/dL — ABNORMAL HIGH (ref 70–99)
Glucose-Capillary: 145 mg/dL — ABNORMAL HIGH (ref 70–99)
Glucose-Capillary: 163 mg/dL — ABNORMAL HIGH (ref 70–99)
Glucose-Capillary: 165 mg/dL — ABNORMAL HIGH (ref 70–99)
Glucose-Capillary: 173 mg/dL — ABNORMAL HIGH (ref 70–99)
Glucose-Capillary: 185 mg/dL — ABNORMAL HIGH (ref 70–99)
Glucose-Capillary: 219 mg/dL — ABNORMAL HIGH (ref 70–99)
Glucose-Capillary: 232 mg/dL — ABNORMAL HIGH (ref 70–99)
Glucose-Capillary: 236 mg/dL — ABNORMAL HIGH (ref 70–99)
Glucose-Capillary: 241 mg/dL — ABNORMAL HIGH (ref 70–99)
Glucose-Capillary: 247 mg/dL — ABNORMAL HIGH (ref 70–99)
Glucose-Capillary: 256 mg/dL — ABNORMAL HIGH (ref 70–99)
Glucose-Capillary: 257 mg/dL — ABNORMAL HIGH (ref 70–99)
Glucose-Capillary: 259 mg/dL — ABNORMAL HIGH (ref 70–99)
Glucose-Capillary: 276 mg/dL — ABNORMAL HIGH (ref 70–99)
Glucose-Capillary: 280 mg/dL — ABNORMAL HIGH (ref 70–99)
Glucose-Capillary: 56 mg/dL — ABNORMAL LOW (ref 70–99)
Glucose-Capillary: 61 mg/dL — ABNORMAL LOW (ref 70–99)
Glucose-Capillary: 62 mg/dL — ABNORMAL LOW (ref 70–99)
Glucose-Capillary: 91 mg/dL (ref 70–99)

## 2022-06-10 LAB — BASIC METABOLIC PANEL
Anion gap: 11 (ref 5–15)
BUN: 53 mg/dL — ABNORMAL HIGH (ref 6–20)
CO2: 27 mmol/L (ref 22–32)
Calcium: 8.7 mg/dL — ABNORMAL LOW (ref 8.9–10.3)
Chloride: 88 mmol/L — ABNORMAL LOW (ref 98–111)
Creatinine, Ser: 2.64 mg/dL — ABNORMAL HIGH (ref 0.44–1.00)
GFR, Estimated: 24 mL/min — ABNORMAL LOW (ref >60)
Glucose, Bld: 387 mg/dL — ABNORMAL HIGH (ref 70–99)
Potassium: 3.6 mmol/L (ref 3.5–5.1)
Sodium: 126 mmol/L — ABNORMAL LOW (ref 135–145)

## 2022-06-10 LAB — TRIGLYCERIDES: Triglycerides: 1271 mg/dL — ABNORMAL HIGH (ref <150)

## 2022-06-10 LAB — ECHOCARDIOGRAM COMPLETE
Area-P 1/2: 4.17 cm2
Calc EF: 51.9 %
Height: 57 in
S' Lateral: 3.9 cm
Single Plane A2C EF: 53.9 %
Single Plane A4C EF: 49.4 %
Weight: 2052.92 oz

## 2022-06-10 MED ORDER — POTASSIUM CHLORIDE CRYS ER 20 MEQ PO TBCR
40.0000 meq | EXTENDED_RELEASE_TABLET | Freq: Once | ORAL | Status: AC
  Administered 2022-06-10: 40 meq via ORAL
  Filled 2022-06-10: qty 2

## 2022-06-10 MED ORDER — DEXTROSE 5 % IV SOLN: INTRAVENOUS | Status: DC

## 2022-06-10 MED ORDER — CARVEDILOL 3.125 MG PO TABS
3.1250 mg | ORAL_TABLET | Freq: Two times a day (BID) | ORAL | Status: DC
  Administered 2022-06-10 – 2022-06-12 (×4): 3.125 mg via ORAL
  Filled 2022-06-10 (×4): qty 1

## 2022-06-10 MED ORDER — HYDROMORPHONE HCL 1 MG/ML IJ SOLN
0.5000 mg | Freq: Three times a day (TID) | INTRAMUSCULAR | Status: DC | PRN
  Administered 2022-06-10 – 2022-06-11 (×3): 0.5 mg via INTRAVENOUS
  Filled 2022-06-10 (×3): qty 0.5

## 2022-06-10 MED ORDER — HYDRALAZINE HCL 50 MG PO TABS
50.0000 mg | ORAL_TABLET | Freq: Three times a day (TID) | ORAL | Status: DC
  Administered 2022-06-10 – 2022-06-12 (×7): 50 mg via ORAL
  Filled 2022-06-10 (×7): qty 1

## 2022-06-10 NOTE — Progress Notes (Signed)
Echocardiogram 2D Echocardiogram has been performed.  Oneal Deputy Ladon Vandenberghe RDCS 06/10/2022, 11:47 AM

## 2022-06-10 NOTE — Progress Notes (Signed)
PROGRESS NOTE    Jennifer [name]  [id number] DOB: [birth year] DOA: 05/29/2022 PCP: Elinor Parkinson   Brief Narrative:  32 year old woman who presented to [facility] ED with abdominal pain, nausea and vomiting. PMHx significant for HTN, HLD, CHF (Echo 11/2021 EF 50-55%, no RWMAs, mildly elevated PASP), pulmonary HTN (s/p CardioMEMS implantation, on Corlanor and sildenafil), T2DM (followed by [facility]), NAFLD, hypothyroidism, astrocytoma, PCOS.   On presentation to ED, patient was found to have an anion gap with acidosis consistent with DKA/hyperglycemia. Was also found to have a TG > 5000.  Concern for ongoing pancreatitis.  CT A/P 3/3 without acute process, no e/o pancreatitis; +hepatosplenomegaly. -Patient's anion gap has closed, she was transitioned off insulin drip, but her workup was noted for evolving anemia, suspicious for hemolytic anemia with elevated LFTs, and LDH, as well patient with significant difficulty with lab accuracy likely in the setting of her hypertriglyceridemia.  As well with significant volume overload, managed by advanced CHF team secondary to acute on chronic systolic CHF.  Assessment & Plan:   Principal Problem:   Hypertriglyceridemia Active Problems:   Acute renal failure superimposed on stage 3a chronic kidney disease (HCC)   History of astrocytoma of brain   Type 2 diabetes mellitus with hyperlipidemia (HCC)   Hypothyroidism   Chronic systolic CHF (congestive heart failure) (HCC)   Essential hypertension   Nonischemic cardiomyopathy (HCC)   Elevated liver enzymes   Pulmonary hypertension (HCC)   NASH (nonalcoholic steatohepatitis)   UTI (urinary tract infection) due to Enterococcus   E. coli UTI (urinary tract infection)   DKA (diabetic ketoacidosis) (HCC)  Diabetic ketoacidosis, type 2 diabetes with hyperglycemia AGMA secondary to above -she is on insulin U-500 at home, reports she has been compliant, reports she ran out of Dexcom refills,  so she was not checking her CBGs.. -Insulin GTT DKA protocol has been discontinued after anion gap closed.  On 3/5. -On insulin gtt due to hypertriglyceridemia protocol, .(requiring around 1000 units of U-500 daily at home).   Acute on chronic pancreatitis secondary to hypertriglyceridemia ?? multifactorial chylomicronemia syndrome -With known history of pancreatitis in the past due to a familial hypertriglyceridemia -[name] on home Repatha -Continue fenofibrate and statin. -started  on insulin gtt. for triglyceride> 5000, triglyceride improved but then got worse.  Now improving again and 1236 today, I reviewed patient's chart extensively, it appears that patient's lowest triglyceride level for 700 almost 2 years ago, and per patient, 1000-15 100 is usually what her baseline is, we will continue insulin drip for another 24 hours and monitor triglyceride every 12 hours for next 1 to 2 days, we will target for less than 1000 based on her history, even if it does not get below 1000, we will likely stop insulin drip after 24 hours. -Continue with Creon -Lipase within normal limit, no epigastric pain, no evidence of pancreatitis on imaging.     Acute on chronic diastolic/systolic CHF Anasarca cardiogenic shock Acute hypoxic respiratory failure/pulmonary hypertension. -cardiology  assistance greatly appreciated, started on milrinone drip.  [name] comfortable on room air with no shortness of breath. -Diuresis per cardiology, back on Lasix 80 mg IV twice daily -Drop in EF to 35%, started on Aldactone Per cardiology, continue Spiro 25 mg  - Add hydralazine 50 mg tid  - Restart ivabradine 7.5 bid   -Coreg 3.125 mg p.o. twice daily. Sildenafil 20 mg 3 times daily. - restart sildenafil soon  Torsemide 80 mg daily. Milrinone was stopped on the  afternoon of 06/09/2022.  Cardiology plans to repeat echo to assess MR and TR after diuresis.   Anemia  -Due to anemia, with markedly elevated LDH, hematology  input greatly appreciated, does not appear to be hemolysis . - -CT abdomen pelvis obtained, no evidence of retroperitoneal hematoma  -No evidence of B12 or iron deficiency   Hyperkalemia/hyponatremia   Mild hyponatremia, slightly worse and 126 today.  No hypokalemia anymore.   AKI (likely in the setting of DKA, UTI) on baseline CKD stage 4 -Baseline creatinine appears to be between 1.6-1.8, peaked at 2.6 and is improving and down to 2.64 today. Avoid nephrotoxic agents as able. Ensure adequate renal perfusion.  Likely cardiorenal syndrome.     E. Coli and enterococcus UTI -She was treated with Rocephin initially, then transitioned to Macrobid given sensitivities, it was discontinued given questionable hemolytic anemia .   Transaminitis, has chronic hepatosplenomegaly History of hepatic steatosis/fatty liver ?? 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   Hypothyroid TSH 5.284, Free T4 0.74.  - Continue levothyroxine   Deconditioning - PT consulted   Nontypical chest pain -Nontypical, pleuritic, EKG nonacute, encouraged use incentive spirometer, negative troponins   Hyperkalemia Resolved.  Pain management: Tapering Dilaudid, will further decrease frequency to every 8 hours as needed.  Patient is asymptomatic.  No abdominal pain or tenderness.  DVT prophylaxis: heparin injection 5,000 Units Start: 06/05/22 1400 Place and maintain sequential compression device Start: 06/02/22 1114 SCDs Start: 05/29/22 1400   Code Status: Full Code  Family Communication:  None present at bedside.  Plan of care discussed with patient in length and he/she verbalized understanding and agreed with it.  Status is: Inpatient Remains inpatient appropriate because: [name] on  insulin   Estimated body mass index is 27.77 kg/m as calculated from the following:   Height as of this encounter: 4\' 9"  (1.448 m).   Weight as of this encounter: 58.2 kg.    Nutritional Assessment: Body mass index is 27.77 kg/m.Marland Kitchen Seen by dietician.  I agree with the assessment and plan as outlined below: Nutrition Status:        . Skin Assessment: I have examined the patient's skin and I agree with the wound assessment as performed by the wound care RN as outlined below:    Consultants:  Cardiology  Procedures:  As above  Antimicrobials:  Anti-infectives (From admission, onward)    Start     Dose/Rate Route Frequency Ordered Stop   06/01/22 0000  nitrofurantoin, macrocrystal-monohydrate, (MACROBID) 100 MG capsule        100 mg Oral Every 12 hours 06/01/22 0936     05/31/22 1030  nitrofurantoin (macrocrystal-monohydrate) (MACROBID) capsule 100 mg  Status:  Discontinued        100 mg Oral Every 12 hours 05/31/22 0936 06/03/22 0717   05/30/22 1000  cefTRIAXone (ROCEPHIN) 1 g in sodium chloride 0.9 % 100 mL IVPB  Status:  Discontinued        1 g 200 mL/hr over 30 Minutes Intravenous Every 24 hours 05/30/22 0949 05/31/22 0936   05/29/22 1015  cefTRIAXone (ROCEPHIN) 1 g in sodium chloride 0.9 % 100 mL IVPB        1 g 200 mL/hr over 30 Minutes Intravenous  Once 05/29/22 1010 05/29/22 1324         Subjective:  Patient seen and examined.  Nurse at the bedside.  Patient has no complaints.  Denies any abdominal pain.  Objective: Vitals:   06/10/22 0651 06/10/22 0700 06/10/22 0800 06/10/22 0900  BP:  139/76 (!) 156/79 (!) 160/87  Pulse: 89 87 94 94  Resp: 18 17 18 19   Temp:      TempSrc:      SpO2: 98% 99% 96% 94%  Weight:      Height:        Intake/Output Summary (Last 24 hours) at 06/10/2022 0946 Last data filed at 06/10/2022 0800 Gross per 24 hour  Intake 1550.29 ml  Output 2150 ml  Net -599.71 ml    Filed Weights   06/08/22 0400 06/09/22 0500 06/10/22  0417  Weight: 59.2 kg 59.5 kg 58.2 kg    Examination:  General exam: Appears calm and comfortable  Respiratory system: Clear to auscultation. Respiratory effort normal. Cardiovascular system: S1 & S2 heard, RRR. No JVD, Gastrointestinal system: Abdomen is nondistended, soft and nontender. No organomegaly or masses felt. Normal bowel sounds heard. Central nervous system: Alert and oriented. No focal neurological deficits. Extremities: Symmetric 5 x 5 power. Skin: No rashes, lesions or ulcers.  Psychiatry: Judgement and insight appear normal. Mood & affect appropriate.    Data Reviewed: I have personally reviewed following labs and imaging studies  CBC: Recent Labs  Lab 06/04/22 0400 06/04/22 0838 06/05/22 0808 06/06/22 0406 06/07/22 0416 06/08/22 0516  WBC 8.7  --  10.1 10.4 11.9* 12.7*  HGB 7.7* 8.2* 7.7* 8.8* 9.4* 9.7*  HCT 21.5* 24.0* 22.9* 24.2* 25.0* 27.9*  MCV 87.4  --  84.5 86.1 84.7 86.9  PLT 100*  --  147* 203 236 [id number]    Basic Metabolic Panel: Recent Labs  Lab 06/06/22 0406 06/06/22 0652 06/07/22 0416 06/08/22 0516 06/09/22 0422 06/09/22 1109 06/09/22 1738 06/10/22 0407  NA 129*   < > 130* 133*  --  134* 133* 126*  K 4.5   < > 4.6 3.6  --  4.9 4.4 3.6  CL 88*   < > 85* 86*  --  90* 89* 88*  CO2 24   < > 26 31  --  29 25 27   GLUCOSE 175*   < > 127* 82  --  178* 140* 387*  BUN 34*   < > 41* 41*  --  53* 56* 53*  CREATININE 2.62*   < > 2.26* 2.05*  --  2.63* 3.22* 2.64*  CALCIUM 7.5*   < > 8.0* 8.7*  --  9.2 9.2 8.7*  MG 2.1  --  1.9 2.3 2.2  --  2.0  --    < > = values in this interval not displayed.    GFR: Estimated Creatinine Clearance: 22.6 mL/min (A) (by C-G formula based on SCr of 2.64 mg/dL (H)). Liver Function Tests: Recent Labs  Lab 06/05/22 0432 06/06/22 0406 06/06/22 0652 06/07/22 0416 06/08/22 0516  AST 255* 149* 132* 116* 72*  ALT 111* 96* 88* 77* 66*  ALKPHOS 211* 233* 230* 221* 222*  BILITOT 1.6* 2.1* 2.2* 2.9* 1.7*  PROT  5.9* 6.4* 6.4* 7.0 7.2  ALBUMIN 2.0* 2.1* 2.1* 2.3* 2.5*    No results for input(s): "LIPASE", "AMYLASE" in the last 168 hours.  No results for input(s): "AMMONIA" in the last 168 hours. Coagulation Profile: No results for input(s): "INR", "PROTIME" in the last 168 hours. Cardiac Enzymes: No results for input(s): "CKTOTAL", "CKMB", "CKMBINDEX", "TROPONINI" in the last 168 hours.  BNP (last 3 results) No results for input(s): "PROBNP" in the last 8760 hours. HbA1C: No results for input(s): "HGBA1C" in the last 72 hours.  CBG: Recent Labs  Lab 06/10/22 0634 06/10/22 0651 06/10/22 0701 06/10/22 0715 06/10/22 0814  GLUCAP 56* 61* 62* 106* 259*    Lipid Profile: Recent Labs    06/09/22 1738 06/10/22 0407  TRIG 1,635* 1,271*    Thyroid Function Tests: No results for input(s): "TSH", "T4TOTAL", "FREET4", "T3FREE", "THYROIDAB" in the last 72 hours. Anemia Panel: No results for input(s): "VITAMINB12", "FOLATE", "FERRITIN", "TIBC", "IRON", "RETICCTPCT" in the last 72 hours. Sepsis Labs: Recent Labs  Lab 06/04/22 0948 06/04/22 1255  LATICACIDVEN 1.2 1.4     No results found for this or any previous visit (from the past 240 hour(s)).    Radiology Studies: [place] EKG SITE RITE  Result Date: 06/08/2022 If Site Rite image not attached, placement could not be confirmed due to current cardiac rhythm.   Scheduled Meds:  aspirin EC  81 mg Oral Daily   carvedilol  3.125 mg Oral BID WC   Chlorhexidine Gluconate Cloth  6 each Topical Daily   DULoxetine  30 mg Oral QHS   fenofibrate  160 mg Oral Daily   heparin injection (subcutaneous)  5,000 Units Subcutaneous Q8H   hydrALAZINE  50 mg Oral Q8H   ivabradine  7.5 mg Oral BID WC   levothyroxine  125 mcg Oral Q0600   lipase/protease/amylase  36,000 Units Oral TID   loratadine  10 mg Oral Daily   pantoprazole  40 mg Oral Daily   rosuvastatin  40 mg Oral Daily   sildenafil  20 mg Oral TID   sodium bicarbonate  650 mg Oral BID    sodium chloride flush  10-40 mL Intracatheter Q12H   spironolactone  25 mg Oral Daily   torsemide  80 mg Oral Daily   Continuous Infusions:  dextrose 50 mL/hr at 06/10/22 0820   insulin 0.2 Units/kg/hr (06/10/22 0800)     LOS: 12 days   Darliss Cheney, MD [facility]  06/10/2022, 9:46 AM   *Please note that this is a verbal dictation therefore any spelling or grammatical errors are due to the "[place] [facility]" system interpretation.  Please page via [place] and do not message via secure chat for urgent patient care matters. Secure chat can be used for non urgent patient care matters.  How to contact the [facility] Attending or Consulting provider Rebecca or covering provider during after hours [place], for this patient?  Check the care team in [facility] and look for a) attending/consulting [facility] provider listed and b) the [facility] team listed. Page or secure chat [phone]. Log into [email or web address] and use [place]'s universal password to access. If you do not have the password, please contact the hospital operator. Locate the [facility] provider you are looking for under [facility] and page to a number that you can be directly reached. If you still have difficulty reaching the provider, please page the [facility] (Director on Call) for the Hospitalists listed on amion for assistance.

## 2022-06-10 NOTE — Inpatient Diabetes Management (Addendum)
Inpatient Diabetes Program Recommendations  AACE/ADA: New Consensus Statement on Inpatient Glycemic Control (2015)  Target Ranges:  Prepandial:   less than 140 mg/dL      Peak postprandial:   less than 180 mg/dL (1-2 hours)      Critically ill patients:  140 - 180 mg/dL    Latest Reference Range & Units 06/10/22 03:21 06/10/22 04:09 06/10/22 05:10 06/10/22 06:21 06/10/22 06:34 06/10/22 06:51 06/10/22 07:01 06/10/22 07:15 06/10/22 08:14  Glucose-Capillary 70 - 99 mg/dL 247 (H) 163 (H) 276 (H) 241 (H) 56 (L) 61 (L) 62 (L) 106 (H) 259 (H)   Admit with:  Diabetic ketoacidosis, type 2 diabetes Pancreatitis, hypertriglyceridemia   History: DM, Pancreatitis, Astrocytoma, Lipoprotein deficiency    Home DM Meds: Humulin R U-500 Insulin 290 units TID with each meal                              Humulin R U-500 Insulin 100 units QHS   Current Orders: IV Insulin Drip for High TG (0.2 units/kg/hr)      Dextrose added this am  Hypoglycemia this am. Dextrose added. Triglycerides still >1200. Consider transition to SQ insulin when Triglycerides are <500.   Based on recent previous admissions and insulin gtt rates may consider Semglee 60 units bid, Novolog 0-15 units tid + hs scale, Novolog 5 units tid meal coverage with parameters for pt to eat at least 50% of meals and glucose needs to be at least 80 mg/dl.                 Thanks,  Tama Headings RN, MSN, BC-ADM Inpatient Diabetes Coordinator Team Pager [phone] (8a-5p)

## 2022-06-10 NOTE — Progress Notes (Signed)
Hypoglycemic Event  CBG: 56 @ 0634  Treatment: 8 oz juice/soda, 2 packs of graham crackers  Symptoms: Sweaty, Shaky, and Hungry  Follow-up CBG: Time:0651 CBG Result: 61  Treatment: 8 oz juice/soda, 2 packs of graham crackers  Follow-up CBG: Time:0700 CBG Result: 62  Treatment: 8 oz juice/soda, 1 pack of graham crackers, breakfast tray delivered and pt began eating from tray.  Follow-up CBG:  [id number] CBG Result: 106  Possible Reasons for Event: Inadequate meal intake and Other: Insulin drip  Comments/MD notified:Pt reports feelings of low blood sugar have resolved. Pt remains vitally stable and alert throughout treatment. Darliss Cheney, MD notified.     Margarito Courser

## 2022-06-10 NOTE — Progress Notes (Addendum)
Advanced Heart Failure Rounding Note  PCP-Cardiologist: None   Subjective:    3/14: Milrinone stopped.   Co-ox 73% off Milrinone. Back on PO diuretics. Wt down another 3 lb. CVP 8   SCr 2.63>>2.64  K 3.6  Had ~10 sec run of torsades yesterday evening. Felt palpitations but no syncope/near syncope. K was 4.0, Mg 2.0. QTc 470 ms on EKG.   Feels ok today. Denies any further palpitations. No dyspnea.   Continues on insulin gtt per IM. TG trending down.    Objective:   Weight Range: 58.2 kg Body mass index is 27.77 kg/m.   Vital Signs:   Temp:  [97.7 F (36.5 C)-98.4 F (36.9 C)] 98.4 F (36.9 C) (03/15 0400) Pulse Rate:  [78-95] 87 (03/15 0700) Resp:  [13-31] 17 (03/15 0700) BP: (129-176)/(63-94) 139/76 (03/15 0700) SpO2:  [90 %-100 %] 99 % (03/15 0700) Weight:  [58.2 kg] 58.2 kg (03/15 0417) Last BM Date : 06/07/22  Weight change: Filed Weights   06/08/22 0400 06/09/22 0500 06/10/22 0417  Weight: 59.2 kg 59.5 kg 58.2 kg    Intake/Output:   Intake/Output Summary (Last 24 hours) at 06/10/2022 0754 Last data filed at 06/10/2022 0700 Gross per 24 hour  Intake 1509.79 ml  Output 2150 ml  Net -640.21 ml      Physical Exam    CVP 8  General:  chronically ill appearing. No respiratory difficulty HEENT: normal Neck: supple. JVD 7-8 cm. Carotids 2+ bilat; no bruits. No lymphadenopathy or thyromegaly appreciated. Cor: PMI nondisplaced. Regular rate & rhythm. No rubs, gallops or murmurs. Lungs: clear Abdomen: soft, nontender, nondistended. No hepatosplenomegaly. No bruits or masses. Good bowel sounds. Extremities: no cyanosis, clubbing, rash, edema + LUE PIC  Neuro: alert & oriented x 3, cranial nerves grossly intact. moves all 4 extremities w/o difficulty. Affect pleasant.   Telemetry   NSR 90s   EKG    N/A   Labs    CBC Recent Labs    06/08/22 0516  WBC 12.7*  HGB 9.7*  HCT 27.9*  MCV 86.9  PLT [id number]   Basic Metabolic Panel Recent Labs     06/09/22 0422 06/09/22 1109 06/09/22 1738 06/10/22 0407  NA  --    < > 133* 126*  K  --    < > 4.4 3.6  CL  --    < > 89* 88*  CO2  --    < > 25 27  GLUCOSE  --    < > 140* 387*  BUN  --    < > 56* 53*  CREATININE  --    < > 3.22* 2.64*  CALCIUM  --    < > 9.2 8.7*  MG 2.2  --  2.0  --    < > = values in this interval not displayed.   Liver Function Tests Recent Labs    06/08/22 0516  AST 72*  ALT 66*  ALKPHOS 222*  BILITOT 1.7*  PROT 7.2  ALBUMIN 2.5*   No results for input(s): "LIPASE", "AMYLASE" in the last 72 hours. Cardiac Enzymes No results for input(s): "CKTOTAL", "CKMB", "CKMBINDEX", "TROPONINI" in the last 72 hours.  BNP: BNP (last 3 results) Recent Labs    04/05/22 1009 06/04/22 0400 06/05/22 0432  BNP 36.9 550.1* 339.5*    ProBNP (last 3 results) No results for input(s): "PROBNP" in the last 8760 hours.   D-Dimer No results for input(s): "DDIMER" in the last 72 hours. Hemoglobin A1C  No results for input(s): "HGBA1C" in the last 72 hours.  Fasting Lipid Panel Recent Labs    06/10/22 0407  TRIG 1,271*   Thyroid Function Tests No results for input(s): "TSH", "T4TOTAL", "T3FREE", "THYROIDAB" in the last 72 hours.  Invalid input(s): "FREET3"  Other results:   Imaging    No results found.   Medications:     Scheduled Medications:  aspirin EC  81 mg Oral Daily   Chlorhexidine Gluconate Cloth  6 each Topical Daily   DULoxetine  30 mg Oral QHS   fenofibrate  160 mg Oral Daily   heparin injection (subcutaneous)  5,000 Units Subcutaneous Q8H   hydrALAZINE  25 mg Oral Q8H   ivabradine  7.5 mg Oral BID WC   levothyroxine  125 mcg Oral Q0600   lipase/protease/amylase  36,000 Units Oral TID   loratadine  10 mg Oral Daily   pantoprazole  40 mg Oral Daily   rosuvastatin  40 mg Oral Daily   sildenafil  20 mg Oral TID   sodium bicarbonate  650 mg Oral BID   sodium chloride flush  10-40 mL Intracatheter Q12H   spironolactone  25 mg Oral  Daily   torsemide  80 mg Oral Daily    Infusions:  dextrose     insulin Stopped (06/10/22 0658)    PRN Medications: acetaminophen, albuterol, dextrose, docusate sodium, guaiFENesin-dextromethorphan, HYDROmorphone (DILAUDID) injection, ondansetron (ZOFRAN) IV, mouth rinse, polyethylene glycol, prochlorperazine, sodium chloride flush    Patient Profile   Ms. 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 AHF team to see for volume management.    Assessment/Plan    Severe hyperTG - Due to LPL deficiency w/ recent pancreatitis. - Continue fenofibrate  - Previously offered referral to [facility] for further management. She prefers to continue with her endocrinologist at [facility] who currently manages/follows. - >5000 on admission, downtrending on insulin gtt, 1271 today  - per primary - CTA/P negative for pancreatitis or other acute findings   NICM, recovered EF, previously with HFrEF (EF 29%) - Echo (5/23): EF 45-50%, RV normal  - Echo 9/23: EF 50-55%, RV normal. mild MR, mild-mod TR -  Cardiomems, last read 3/2: 18 (PAD goal 17) - Echo this admission with EF 30-35% (lower than past), RV mildly dilated, moderate-severe MR, severe TR, dilated IVC.  - Co-ox stable off milrinone  - CVP 8. Continue torsemide 80 mg daily.  - Continue Spiro 25 mg  - Increase hydralazine to 50 mg tid  - Continue ivabradine 7.5 bid   - Continue sildenafil  - Hold restart of coreg  - Off Jardiance due to frequent GU infections (+UTI this admission). Off Entresto with AKI.  - Repeat echo today to reassess MR and TR.    3. Hx CAD - Single vessel RCA occlusion (small co-dominant vessel) on cardiac cath 09/08. Medical management - Continue aspirin + statin. - No chest pain on exam   4. CKD w/  AKI -Scr baseline variable with multiple AKIs, typically 1.5-2.0 -Scr this admit 1.07> 2.6>2.26>2.05>>2.63>>2.64    5. Pulmonary hypertension - Mild to moderate on RHC 5/23. PVR 3.7 WU.   6. TR - Prominent v-waves in RA tracing on RHC 5/23. - Echo 5/23 shows only mild TR  - mild to mod on 9/23 echo - Severe on Echo this  admit. Now diuresed, will repeat echo     7. Type 3c DM, DKA - Follows w/ endocrinology at [facility]. - 2/2 chronic pancreatitis  - per primary   9. Chronic abdominal swelling/bloating - Seen at [facility] (11/20) for IBS, per chart review. - Had penumatosis intestinalis. She was started on pancreatic enzyme replacement. - EGD (10/21): normal, bx taken to assess for celiac. - CT abdomen (2/22) and RUQ US showed no obvious gallbladder stones. - HIDA (4/22): ended early due to tachypnea, but cystic duct and gallbladder appeared WNL. - CT abdomen 12/22 unremarkable  - US abdomen 11/25/21: No ascites - suspect primarily fatty liver - Abd XR 12/27: mild gaseous distention    10. Poor Med Compliance - Followed by paramedicine OP.  Will need HF Paramedicine at discharge.   11. Hyponatremia - Na 126 - fluid restrict      Length of Stay: [address], PA-C  06/10/2022, 7:54 AM  Advanced Heart Failure Team Pager [phone] (M-F; 7a - 5p)  Please contact [facility] for night-coverage after hours (5p -7a ) and weekends on [email or web address]  Patient seen with PA, agree with the above note.   Creatinine stable 2.64.  CVP 8.  TGs trending down on insulin gtt, 1271 today.  Co-ox excellent at 73%, she is off milrinone. BP remains elevated.   Episode yesterday pm identified as torsades is actually artifact, no significant arrhythmias.   Na lower at 126.   General: NAD Neck:JVp 8 cm, no thyromegaly or thyroid nodule.  Lungs: Clear to auscultation bilaterally with normal respiratory effort. CV: Nondisplaced PMI.  Heart regular S1/S2, no S3/S4, no murmur.  No  peripheral edema.   Abdomen: Soft, nontender, no hepatosplenomegaly, no distention.  Skin: Intact without lesions or rashes.  Neurologic: Alert and oriented x 3.  Psych: Normal affect. Extremities: No clubbing or cyanosis.  HEENT: Normal.   Excellent co-ox, CVP 8.  - Continue torsemide 80 mg daily.  - Hydralazine 50 tid + sildenafil 20 tid.  - Can add Coreg 3.125 bid - spironolactone 25 daily - ivabradine - Will get repeat echo today to reassess MR and TR after diuresis.   TGs trending down.  No abdominal pain.  Discussed with Dr. Doristine Bosworth, 1 more day of insulin gtt.   No torsades on telemetry, it was actually artifact.   Hopefully home over weekend. OK for progressive unit.   Loralie Champagne 06/10/2022 9:12 AM

## 2022-06-10 NOTE — TOC Progression Note (Signed)
Transition of Care [facility]) - Progression Note    Patient Details  Name: Jennifer [name] MRN: [id number] Date of Birth: [birth year]  Transition of Care [facility]) CM/SW Contact  Erenest Rasher, RN Phone Number: [phone] 06/10/2022, 5:13 PM  Clinical Narrative:     CM spoke to pt and states she lives at home with [name], [name]. She has scale. Educated on the importance of daily weights. States he will provide transportation home. Will continue to follow for dc needs.   Expected Discharge Plan: Home/Self Care Barriers to Discharge: Continued Medical Work up  Expected Discharge Plan and Services In-house Referral: NA Discharge Planning Services: CM Consult   Living arrangements for the past 2 months: Apartment Expected Discharge Date: 06/01/22                 DME Agency: NA      Social Determinants of Health ([place]) Interventions [place]: No Food Insecurity (03/14/2022)  Housing: Medium Risk (04/05/2022)  Transportation Needs: Unmet Transportation Needs (04/05/2022)  Utilities: Not At Risk (03/14/2022)  Alcohol Screen: Low Risk  (12/10/2021)  Depression (PHQ2-9): Low Risk  (12/10/2021)  Financial Resource Strain: High Risk (04/05/2022)  Physical Activity: Sufficiently Active (12/10/2021)  Social Connections: Socially Integrated (12/10/2021)  Stress: No Stress Concern Present (12/10/2021)  Tobacco Use: Low Risk  (05/29/2022)    Readmission Risk Interventions    06/01/2022   11:13 AM 03/24/2022    5:05 PM 12/07/2021    4:36 PM  Readmission Risk Prevention Plan  Transportation Screening Complete Complete Complete  Medication Review (RN Care Manager) Referral to Pharmacy Complete Complete  PCP or Specialist appointment within 3-5 days of discharge  Patient refused Complete  HRI or [place] Complete  Complete  SW Recovery Care/Counseling Consult Complete  Complete  Palliative Care Screening Not Applicable  Not [place] Not Applicable Not Applicable Not Applicable

## 2022-06-11 LAB — GLUCOSE, CAPILLARY
Glucose-Capillary: 139 mg/dL — ABNORMAL HIGH (ref 70–99)
Glucose-Capillary: 27 mg/dL — CL (ref 70–99)
Glucose-Capillary: 132 mg/dL — ABNORMAL HIGH (ref 70–99)
Glucose-Capillary: 81 mg/dL (ref 70–99)
Glucose-Capillary: 336 mg/dL — ABNORMAL HIGH (ref 70–99)
Glucose-Capillary: 386 mg/dL — ABNORMAL HIGH (ref 70–99)
Glucose-Capillary: 260 mg/dL — ABNORMAL HIGH (ref 70–99)
Glucose-Capillary: 288 mg/dL — ABNORMAL HIGH (ref 70–99)
Glucose-Capillary: 174 mg/dL — ABNORMAL HIGH (ref 70–99)
Glucose-Capillary: 104 mg/dL — ABNORMAL HIGH (ref 70–99)

## 2022-06-11 LAB — COMPREHENSIVE METABOLIC PANEL
ALT: 41 U/L (ref 0–44)
AST: 52 U/L — ABNORMAL HIGH (ref 15–41)
Albumin: 2.9 g/dL — ABNORMAL LOW (ref 3.5–5.0)
Alkaline Phosphatase: 141 U/L — ABNORMAL HIGH (ref 38–126)
Anion gap: 12 (ref 5–15)
BUN: 52 mg/dL — ABNORMAL HIGH (ref 6–20)
CO2: 25 mmol/L (ref 22–32)
Calcium: 9.4 mg/dL (ref 8.9–10.3)
Chloride: 92 mmol/L — ABNORMAL LOW (ref 98–111)
Creatinine, Ser: 2.59 mg/dL — ABNORMAL HIGH (ref 0.44–1.00)
GFR, Estimated: 25 mL/min — ABNORMAL LOW (ref >60)
Glucose, Bld: 168 mg/dL — ABNORMAL HIGH (ref 70–99)
Potassium: 4.7 mmol/L (ref 3.5–5.1)
Sodium: 129 mmol/L — ABNORMAL LOW (ref 135–145)
Total Bilirubin: 1.1 mg/dL (ref 0.3–1.2)
Total Protein: 7.6 g/dL (ref 6.5–8.1)

## 2022-06-11 LAB — COOXEMETRY PANEL
Carboxyhemoglobin: 2.2 % — ABNORMAL HIGH (ref 0.5–1.5)
Methemoglobin: 0.2 % (ref 0.0–1.5)
O2 Saturation: 60.3 %
Total hemoglobin: 9.9 g/dL — ABNORMAL LOW (ref 12.0–16.0)

## 2022-06-11 LAB — TRIGLYCERIDES
Triglycerides: 1430 mg/dL — ABNORMAL HIGH (ref <150)
Triglycerides: 1635 mg/dL — ABNORMAL HIGH (ref <150)

## 2022-06-11 LAB — MAGNESIUM: Magnesium: 1.9 mg/dL (ref 1.7–2.4)

## 2022-06-11 MED ORDER — INSULIN REGULAR HUMAN (CONC) 500 UNIT/ML ~~LOC~~ SOPN
150.0000 [IU] | PEN_INJECTOR | Freq: Three times a day (TID) | SUBCUTANEOUS | Status: DC
  Administered 2022-06-11 – 2022-06-12 (×3): 150 [IU] via SUBCUTANEOUS
  Filled 2022-06-11: qty 3

## 2022-06-11 MED ORDER — INSULIN ASPART 100 UNIT/ML IJ SOLN: 0.0000 [IU] | Freq: Every day | INTRAMUSCULAR | Status: DC

## 2022-06-11 MED ORDER — INSULIN ASPART 100 UNIT/ML IJ SOLN: 0.0000 [IU] | Freq: Three times a day (TID) | INTRAMUSCULAR | Status: DC

## 2022-06-11 MED ORDER — MAGNESIUM SULFATE 2 GM/50ML IV SOLN
2.0000 g | Freq: Once | INTRAVENOUS | Status: AC
  Administered 2022-06-11: 2 g via INTRAVENOUS
  Filled 2022-06-11: qty 50

## 2022-06-11 MED ORDER — INSULIN ASPART 100 UNIT/ML IJ SOLN
0.0000 [IU] | Freq: Three times a day (TID) | INTRAMUSCULAR | Status: DC
  Administered 2022-06-11: 4 [IU] via SUBCUTANEOUS
  Administered 2022-06-11: 11 [IU] via SUBCUTANEOUS
  Administered 2022-06-11: 20 [IU] via SUBCUTANEOUS
  Administered 2022-06-12 (×2): 3 [IU] via SUBCUTANEOUS

## 2022-06-11 MED ORDER — OXYCODONE HCL 5 MG PO TABS
5.0000 mg | ORAL_TABLET | Freq: Two times a day (BID) | ORAL | Status: DC | PRN
  Administered 2022-06-11: 5 mg via ORAL
  Filled 2022-06-11: qty 1

## 2022-06-11 MED ORDER — PREGABALIN 75 MG PO CAPS
300.0000 mg | ORAL_CAPSULE | Freq: Two times a day (BID) | ORAL | Status: DC
  Administered 2022-06-11 – 2022-06-12 (×3): 300 mg via ORAL
  Filled 2022-06-11 (×3): qty 4

## 2022-06-11 NOTE — Progress Notes (Addendum)
Advanced Heart Failure Rounding Note  PCP-Cardiologist: None   Subjective:    3/14: Milrinone stopped.   Co-ox 60% off Milrinone. Back on PO diuretics. Wt back up 3 lb. CVP 5   SCr 2.63>>2.64 > 2.59  TGs 1,271-> 1430 off insulin gtt  [place] CP or SOB. Walking halls.   Objective:   Weight Range: 59.7 kg Body mass index is 28.48 kg/m.   Vital Signs:   Temp:  [97.8 F (36.6 C)-98.3 F (36.8 C)] 98.1 F (36.7 C) (03/16 0743) Pulse Rate:  [76-99] 80 (03/16 0700) Resp:  [16-25] 17 (03/16 0700) BP: (126-200)/(43-100) 126/62 (03/16 0700) SpO2:  [95 %-99 %] 96 % (03/16 0700) Weight:  [59.7 kg] 59.7 kg (03/16 0500) Last BM Date : 06/07/22  Weight change: Filed Weights   06/09/22 0500 06/10/22 0417 06/11/22 0500  Weight: 59.5 kg 58.2 kg 59.7 kg    Intake/Output:   Intake/Output Summary (Last 24 hours) at 06/11/2022 0934 Last data filed at 06/11/2022 0700 Gross per 24 hour  Intake 1343.75 ml  Output 3700 ml  Net -2356.25 ml       Physical Exam    General:  Lying in bed . No resp difficulty HEENT: normal Neck: supple. JVP 5  Carotids 2+ bilat; no bruits. No lymphadenopathy or thryomegaly appreciated. Cor: PMI nondisplaced. Regular rate & rhythm. No rubs, gallops or murmurs. Lungs: clear Abdomen: obese soft, nontender, nondistended. No hepatosplenomegaly. No bruits or masses. Good bowel sounds. Extremities: no cyanosis, clubbing, rash, edema Neuro: alert & orientedx3, cranial nerves grossly intact. moves all 4 extremities w/o difficulty. Affect pleasant   Telemetry   SR 80-90s Personally reviewed  Labs    CBC No results for input(s): "WBC", "NEUTROABS", "HGB", "HCT", "MCV", "PLT" in the last 72 hours.  Basic Metabolic Panel Recent Labs    06/09/22 1738 06/10/22 0407 06/11/22 0414  NA 133* 126* 129*  K 4.4 3.6 4.7  CL 89* 88* 92*  CO2 25 27 25   GLUCOSE 140* 387* 168*  BUN 56* 53* 52*  CREATININE 3.22* 2.64* 2.59*  CALCIUM 9.2 8.7* 9.4  MG 2.0   --  1.9    Liver Function Tests Recent Labs    06/11/22 0414  AST 52*  ALT 41  ALKPHOS 141*  BILITOT 1.1  PROT 7.6  ALBUMIN 2.9*    No results for input(s): "LIPASE", "AMYLASE" in the last 72 hours. Cardiac Enzymes No results for input(s): "CKTOTAL", "CKMB", "CKMBINDEX", "TROPONINI" in the last 72 hours.  BNP: BNP (last 3 results) Recent Labs    04/05/22 1009 06/04/22 0400 06/05/22 0432  BNP 36.9 550.1* 339.5*     ProBNP (last 3 results) No results for input(s): "PROBNP" in the last 8760 hours.   D-Dimer No results for input(s): "DDIMER" in the last 72 hours. Hemoglobin A1C No results for input(s): "HGBA1C" in the last 72 hours.  Fasting Lipid Panel Recent Labs    06/11/22 0414  TRIG 1,430*    Thyroid Function Tests No results for input(s): "TSH", "T4TOTAL", "T3FREE", "THYROIDAB" in the last 72 hours.  Invalid input(s): "FREET3"  Other results:   Imaging    ECHOCARDIOGRAM COMPLETE  Result Date: 06/10/2022    ECHOCARDIOGRAM REPORT   Patient Name:   Yanice Pross Date of Exam: 06/10/2022 Medical Rec #:  [id number]         Height:       57.0 in Accession #:    [id number]        Weight:  128.3 lb Date of Birth:  1990/06/24         BSA:          1.489 m Patient Age:    32 years          BP:           147/88 mmHg Patient Gender: F                 HR:           95 bpm. Exam Location:  Inpatient Procedure: 2D Echo, Color Doppler and Cardiac Doppler Indications:    I34.0 Nonrheumatic mitral (valve) insufficiency  History:        Patient has prior history of Echocardiogram examinations, most                 recent 06/04/2022. CHF; Risk Factors:Hypertension, Diabetes and                 Dyslipidemia.  Sonographer:    Raquel Sarna Senior RDCS Referring Phys: Wilmer Floor SIMMONS IMPRESSIONS  1. Left ventricular ejection fraction, by estimation, is 45 to 50%. The left ventricle has mildly decreased function. The left ventricle demonstrates global hypokinesis. Left  ventricular diastolic parameters are indeterminate.  2. Right ventricular systolic function is normal. The right ventricular size is normal. There is normal pulmonary artery systolic pressure. The estimated right ventricular systolic pressure is [id number] mmHg.  3. Left atrial size was mildly dilated.  4. Right atrial size was mildly dilated.  5. A small pericardial effusion is present. There is no evidence of cardiac tamponade.  6. The mitral valve is grossly normal. Moderate mitral valve regurgitation. No evidence of mitral stenosis.  7. Tricuspid valve regurgitation is mild to moderate.  8. The aortic valve is grossly normal. Aortic valve regurgitation is trivial.  9. The inferior vena cava is normal in size with <50% respiratory variability, suggesting right atrial pressure of 8 mmHg. Comparison(s): Prior images reviewed side by side. LV function, MR, and TR all mildly improved compared to prior. FINDINGS  Left Ventricle: Left ventricular ejection fraction, by estimation, is 45 to 50%. The left ventricle has mildly decreased function. The left ventricle demonstrates global hypokinesis. The left ventricular internal cavity size was normal in size. There is  no left ventricular hypertrophy. Left ventricular diastolic parameters are indeterminate. Right Ventricle: The right ventricular size is normal. Right vetricular wall thickness was not well visualized. Right ventricular systolic function is normal. There is normal pulmonary artery systolic pressure. The tricuspid regurgitant velocity is 2.46 m/s, and with an assumed right atrial pressure of 8 mmHg, the estimated right ventricular systolic pressure is [id number] mmHg. Left Atrium: Left atrial size was mildly dilated. Right Atrium: Right atrial size was mildly dilated. Pericardium: A small pericardial effusion is present. There is no evidence of cardiac tamponade. Mitral Valve: The mitral valve is grossly normal. Moderate mitral valve regurgitation. No evidence of mitral  valve stenosis. Tricuspid Valve: The tricuspid valve is grossly normal. Tricuspid valve regurgitation is mild to moderate. No evidence of tricuspid stenosis. Aortic Valve: The aortic valve is grossly normal. Aortic valve regurgitation is trivial. Pulmonic Valve: The pulmonic valve was not well visualized. Pulmonic valve regurgitation is mild. No evidence of pulmonic stenosis. Aorta: The aortic root, ascending aorta and aortic arch are all structurally normal, with no evidence of dilitation or obstruction. Venous: The inferior vena cava is normal in size with less than 50% respiratory variability, suggesting right atrial pressure of 8 mmHg. IAS/Shunts:  The atrial septum is grossly normal.  LEFT VENTRICLE PLAX 2D LVIDd:         4.95 cm     Diastology LVIDs:         3.90 cm     LV e' medial:    5.55 cm/s LV PW:         0.90 cm     LV E/e' medial:  16.2 LV IVS:        0.60 cm     LV e' lateral:   7.51 cm/s LVOT diam:     1.80 cm     LV E/e' lateral: 12.0 LV SV:         47 LV SV Index:   31 LVOT Area:     2.54 cm  LV Volumes (MOD) LV vol d, MOD A2C: 98.1 ml LV vol d, MOD A4C: 92.3 ml LV vol s, MOD A2C: 45.2 ml LV vol s, MOD A4C: 46.7 ml LV SV MOD A2C:     52.9 ml LV SV MOD A4C:     92.3 ml LV SV MOD BP:      51.3 ml RIGHT VENTRICLE RV S prime:     12.40 cm/s TAPSE (M-mode): 2.0 cm LEFT ATRIUM             Index        RIGHT ATRIUM           Index LA diam:        2.80 cm 1.88 cm/m   RA Area:     15.50 cm LA Vol (A2C):   35.6 ml 23.90 ml/m  RA Volume:   38.70 ml  25.98 ml/m LA Vol (A4C):   26.5 ml 17.79 ml/m LA Biplane Vol: 31.1 ml 20.88 ml/m  AORTIC VALVE LVOT Vmax:   106.00 cm/s LVOT Vmean:  77.400 cm/s LVOT VTI:    0.184 m  AORTA Ao Root diam: 2.50 cm Ao Asc diam:  2.80 cm MITRAL VALVE               TRICUSPID VALVE MV Area (PHT): 4.17 cm    TR Peak grad:   24.2 mmHg MV Decel Time: 182 msec    TR Vmax:        246.00 cm/s MV E velocity: 90.10 cm/s MV A velocity: 96.40 cm/s  SHUNTS MV E/A ratio:  0.93         Systemic VTI:  0.18 m                            Systemic Diam: 1.80 cm Buford Dresser MD Electronically signed by Buford Dresser MD Signature Date/Time: 06/10/2022/4:03:10 PM    Final      Medications:     Scheduled Medications:  aspirin EC  81 mg Oral Daily   carvedilol  3.125 mg Oral BID WC   Chlorhexidine Gluconate Cloth  6 each Topical Daily   DULoxetine  30 mg Oral QHS   fenofibrate  160 mg Oral Daily   heparin injection (subcutaneous)  5,000 Units Subcutaneous Q8H   hydrALAZINE  50 mg Oral Q8H   insulin aspart  0-20 Units Subcutaneous TID WC   insulin aspart  0-5 Units Subcutaneous QHS   insulin regular human CONCENTRATED  150 Units Subcutaneous TID WC   ivabradine  7.5 mg Oral BID WC   levothyroxine  125 mcg Oral Q0600   lipase/protease/amylase  36,000 Units Oral TID  loratadine  10 mg Oral Daily   pantoprazole  40 mg Oral Daily   pregabalin  300 mg Oral BID   rosuvastatin  40 mg Oral Daily   sildenafil  20 mg Oral TID   sodium bicarbonate  650 mg Oral BID   sodium chloride flush  10-40 mL Intracatheter Q12H   spironolactone  25 mg Oral Daily   torsemide  80 mg Oral Daily    Infusions:  magnesium sulfate bolus IVPB      PRN Medications: acetaminophen, albuterol, dextrose, docusate sodium, guaiFENesin-dextromethorphan, ondansetron (ZOFRAN) IV, mouth rinse, oxyCODONE, polyethylene glycol, prochlorperazine, sodium chloride flush    Patient Profile   Ms. 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 AHF team to see for volume management.    Assessment/Plan    Severe hyperTG - Due to LPL deficiency w/ recent pancreatitis. - Continue fenofibrate  - Previously offered referral to [facility] for further management. She prefers to continue  with her endocrinologist at [facility] who currently manages/follows. - >5000 on admission, down-trended on insulin gtt. Insulin 1,430-> 1271 today  - CTA/P negative for pancreatitis or other acute findings - per primary    Acute on chronic HF R>L - NICM, recovered EF, previously with HFrEF (EF 29%) - Echo (5/23): EF 45-50%, RV normal  - Echo 9/23: EF 50-55%, RV normal. mild MR, mild-mod TR - Echo 06/10/22 EF 45-50% mild-mod TR, moderate MR -  Cardiomems, last read 3/2: 18 (PAD goal 17) - Echo this admission with EF 30-35% (lower than past), RV mildly dilated, moderate-severe MR, severe TR, dilated IVC.  - Co-ox stable off milrinone 60% - CVP 5-6 Continue torsemide 80 mg daily.  - Continue Spiro 25 mg  - Continue hydralazine 50 mg tid  - Continue ivabradine 7.5 bid   - Continue sildenafil  - Hold carvedilol - Off Jardiance due to frequent GU infections (+UTI this admission). Off Entresto with AKI.     3. Hx CAD - Single vessel RCA occlusion (small co-dominant vessel) on cardiac cath 09/08. Medical management - Continue aspirin + statin. - No s/s angina   4. CKD w/ AKI -Scr baseline variable with multiple AKIs, typically 1.5-2.0 -Scr this admit 1.8> 2.6>2.26>2.05>>2.63>>2.64  5. Pulmonary hypertension - Mild to moderate on RHC 5/23. PVR 3.7 WU.   6. TR - Prominent v-waves in RA tracing on RHC 5/23. - Echo 5/23 shows only mild TR  - mild to mod on 9/23 echo - 06/10/22 Echo mod to moderate TR   7. Type 3c DM, DKA - Follows w/ endocrinology at [facility]. - 2/2 chronic pancreatitis  - per primary   9. Chronic abdominal swelling/bloating - Seen at [facility] (11/20) for IBS, per chart review. - Had penumatosis intestinalis. She was started on pancreatic enzyme replacement. - EGD (10/21): normal, bx taken to assess for celiac. - CT abdomen (2/22) and RUQ US showed no obvious gallbladder stones. - HIDA (4/22): ended early due to tachypnea, but cystic duct and gallbladder appeared WNL. - CT  abdomen 12/22 unremarkable  - US abdomen 11/25/21: No ascites - suspect primarily fatty liver - Abd XR 12/27: mild gaseous distention    10. Poor Med Compliance - Followed by paramedicine OP.  Will need HF Paramedicine at discharge.   11. Hyponatremia - Na 129 - FW restrict  [name] well tuned up from HF perspective. This is the best I have seen her in a long time. Stable for discharge from a HF perspective on current inpatient meds.   AHF team will sign off. We will arrange outpatient f/u. Would consider f/u in [facility] with Dr. Debara Pickett for her TGs.    Length of Stay: [place], MD  06/11/2022, 9:34 AM  Advanced Heart Failure Team Pager [phone] (M-F; 7a - 5p)  Please contact [facility] for night-coverage after hours (5p -7a ) and weekends on [email or web address]

## 2022-06-11 NOTE — Progress Notes (Signed)
PROGRESS NOTE    Jennifer [name]  [id number] DOB: 1990/12/03 DOA: 05/29/2022 PCP: Jennifer [name]   Brief Narrative:  32 year old woman who presented to [facility] ED with abdominal pain, nausea and vomiting. PMHx significant for HTN, HLD, CHF (Echo 11/2021 EF 50-55%, no RWMAs, mildly elevated PASP), pulmonary HTN (s/p CardioMEMS implantation, on Corlanor and sildenafil), T2DM (followed by [facility]), NAFLD, hypothyroidism, astrocytoma, PCOS.   On presentation to ED, patient was found to have an anion gap with acidosis consistent with DKA/hyperglycemia. Was also found to have a TG > 5000.  Concern for ongoing pancreatitis.  CT A/P 3/3 without acute process, no e/o pancreatitis; +hepatosplenomegaly. -Patient's anion gap has closed, she was transitioned off insulin drip, but her workup was noted for evolving anemia, suspicious for hemolytic anemia with elevated LFTs, and LDH, as well patient with significant difficulty with lab accuracy likely in the setting of her hypertriglyceridemia.  As well with significant volume overload, managed by advanced CHF team secondary to acute on chronic systolic CHF.  Assessment & Plan:   Principal Problem:   Hypertriglyceridemia Active Problems:   Acute renal failure superimposed on stage 3a chronic kidney disease (HCC)   History of astrocytoma of brain   Type 2 diabetes mellitus with hyperlipidemia (HCC)   Hypothyroidism   Chronic systolic CHF (congestive heart failure) (HCC)   Essential hypertension   Nonischemic cardiomyopathy (HCC)   Elevated liver enzymes   Pulmonary hypertension (HCC)   NASH (nonalcoholic steatohepatitis)   UTI (urinary tract infection) due to Enterococcus   E. coli UTI (urinary tract infection)   DKA (diabetic ketoacidosis) (HCC)  Diabetic ketoacidosis, type 2 diabetes with hyperglycemia AGMA secondary to above -she is on insulin U-500 at home, reports she has been compliant, reports she ran out of Dexcom refills,  so she was not checking her CBGs.. -Insulin GTT DKA protocol has been discontinued after anion gap closed.  On 3/5. -On insulin gtt due to hypertriglyceridemia protocol, I will now stop her insulin drip and place her back on her home regimen of highly concentrated insulin.   Acute on chronic pancreatitis secondary to hypertriglyceridemia ?? multifactorial chylomicronemia syndrome -With known history of pancreatitis in the past due to a familial hypertriglyceridemia -[name] on home Repatha -Continue fenofibrate and statin. -started  on insulin gtt. for triglyceride> 5000, triglyceride improved but then got worse.  Now improving again and 1236 today, I reviewed patient's chart extensively, it appears that patient's lowest triglyceride level for 700 almost 2 years ago, and per patient, 1000-1500 is usually what her baseline is, and the best we have seen her triglyceride to be around 250.  Currently she is around 1450.  Presuming this is her baseline, we will now stop insulin drip but continue to monitor triglycerides every 12 hours for next 24 hours.  If the numbers remain stable, she will likely be stable for discharge tomorrow.   -Continue with Creon -Lipase within normal limit, no epigastric pain, no evidence of pancreatitis on imaging.     Acute on chronic diastolic/systolic CHF Anasarca cardiogenic shock Acute hypoxic respiratory failure/pulmonary hypertension. -cardiology  assistance greatly appreciated, started on milrinone drip.  [name] comfortable on room air with no shortness of breath. -Diuresis per cardiology, she received IV Lasix and then was transitioned to torsemide. -Drop in EF to 35%, started on Aldactone Cardiology has now cleared her for discharge and has signed off.  Per cardiology, discharged on following medications.   Aspirin 81 mg  Coreg 3.125 mg  twice daily  Spiro 25 mg  hydralazine 50 mg tid  ivabradine 7.5 bid   Sildenafil 20 mg 3 times daily. - restart sildenafil  soon  Torsemide 80 mg daily. Milrinone was stopped on the afternoon of 06/09/2022.  Cardiology will follow-up with her outpatient.   Anemia  -Due to anemia, with markedly elevated LDH, hematology input greatly appreciated, does not appear to be hemolysis . - -CT abdomen pelvis obtained, no evidence of retroperitoneal hematoma  -No evidence of B12 or iron deficiency   Hyperkalemia/hyponatremia   Mild hyponatremia, slightly worse and 129 today.  No hypokalemia anymore.   AKI (likely in the setting of DKA, UTI) on baseline CKD stage 4 -Baseline creatinine appears to be between 1.6-1.8, peaked at 2.6 and is improving and down to 2.64 today. Avoid nephrotoxic agents as able. Ensure adequate renal perfusion.  Likely cardiorenal syndrome.     E. Coli and enterococcus UTI -She was treated with Rocephin initially, then transitioned to Macrobid given sensitivities, it was discontinued given questionable hemolytic anemia .   Transaminitis, has chronic hepatosplenomegaly History of hepatic steatosis/fatty liver ?? 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   Hypothyroid TSH 5.284, Free T4 0.74.  - Continue levothyroxine   Deconditioning - PT consulted   Nontypical chest pain -Nontypical, pleuritic, EKG nonacute, encouraged use incentive spirometer, negative troponins   Hyperkalemia Resolved.  Pain management: Started tapering few days ago.  Will stop Dilaudid and start on oxycodone immediate release 5 mg every 12 hours as needed in anticipation of discharge tomorrow.  DVT prophylaxis: heparin injection 5,000 Units Start: 06/05/22 1400 Place and maintain sequential compression device Start: 06/02/22 1114 SCDs Start: 05/29/22 1400   Code Status: Full Code  Family  Communication:  None present at bedside.  Plan of care discussed with patient in length and he/she verbalized understanding and agreed with it.  Status is: Inpatient Remains inpatient appropriate because: Monitoring overnight.   Estimated body mass index is 28.48 kg/m as calculated from the following:   Height as of this encounter: 4\' 9"  (1.448 m).   Weight as of this encounter: 59.7 kg.    Nutritional Assessment: Body mass index is 28.48 kg/m.Marland Kitchen Seen by dietician.  I agree with the assessment and plan as outlined below: Nutrition Status:        . Skin Assessment: I have examined the patient's skin and I agree with the wound assessment as performed by the wound care RN as outlined below:    Consultants:  Cardiology  Procedures:  As above  Antimicrobials:  Anti-infectives (From admission, onward)    Start     Dose/Rate Route Frequency Ordered Stop   06/01/22 0000  nitrofurantoin, macrocrystal-monohydrate, (MACROBID) 100 MG capsule        100 mg Oral Every 12 hours 06/01/22 0936     05/31/22 1030  nitrofurantoin (macrocrystal-monohydrate) (MACROBID) capsule 100 mg  Status:  Discontinued        100 mg Oral Every 12 hours 05/31/22 0936 06/03/22 0717   05/30/22 1000  cefTRIAXone (ROCEPHIN) 1 g in sodium chloride 0.9 % 100 mL IVPB  Status:  Discontinued        1 g 200 mL/hr over 30 Minutes Intravenous Every 24 hours 05/30/22 0949 05/31/22 0936   05/29/22 1015  cefTRIAXone (ROCEPHIN) 1 g in sodium chloride 0.9 %  100 mL IVPB        1 g 200 mL/hr over 30 Minutes Intravenous  Once 05/29/22 1010 05/29/22 1324         Subjective:  Patient seen and examined.  She has no complaints.  Objective: Vitals:   06/11/22 0500 06/11/22 0600 06/11/22 0700 06/11/22 0743  BP: (!) 159/89 (!) 141/74 126/62   Pulse: 80 85 80   Resp: 16 17 17    Temp:    98.1 F (36.7 C)  TempSrc:    Oral  SpO2: 98% 98% 96%   Weight: 59.7 kg     Height:        Intake/Output Summary (Last 24  hours) at 06/11/2022 1047 Last data filed at 06/11/2022 0700 Gross per 24 hour  Intake 1343.75 ml  Output 3700 ml  Net -2356.25 ml    Filed Weights   06/09/22 0500 06/10/22 0417 06/11/22 0500  Weight: 59.5 kg 58.2 kg 59.7 kg    Examination:  General exam: Appears calm and comfortable  Respiratory system: Clear to auscultation. Respiratory effort normal. Cardiovascular system: S1 & S2 heard, RRR. No JVD, murmurs, rubs, gallops or clicks. No pedal edema. Gastrointestinal system: Abdomen is nondistended, soft and nontender. No organomegaly or masses felt. Normal bowel sounds heard. Central nervous system: Alert and oriented. No focal neurological deficits. Extremities: Symmetric 5 x 5 power. Skin: No rashes, lesions or ulcers.  Psychiatry: Judgement and insight appear normal. Mood & affect appropriate.    Data Reviewed: I have personally reviewed following labs and imaging studies  CBC: Recent Labs  Lab 06/05/22 0808 06/06/22 0406 06/07/22 0416 06/08/22 0516  WBC 10.1 10.4 11.9* 12.7*  HGB 7.7* 8.8* 9.4* 9.7*  HCT 22.9* 24.2* 25.0* 27.9*  MCV 84.5 86.1 84.7 86.9  PLT 147* 203 236 [id number]    Basic Metabolic Panel: Recent Labs  Lab 06/07/22 0416 06/08/22 0516 06/09/22 0422 06/09/22 1109 06/09/22 1738 06/10/22 0407 06/11/22 0414  NA 130* 133*  --  134* 133* 126* 129*  K 4.6 3.6  --  4.9 4.4 3.6 4.7  CL 85* 86*  --  90* 89* 88* 92*  CO2 26 31  --  29 25 27 25   GLUCOSE 127* 82  --  178* 140* 387* 168*  BUN 41* 41*  --  53* 56* 53* 52*  CREATININE 2.26* 2.05*  --  2.63* 3.22* 2.64* 2.59*  CALCIUM 8.0* 8.7*  --  9.2 9.2 8.7* 9.4  MG 1.9 2.3 2.2  --  2.0  --  1.9    GFR: Estimated Creatinine Clearance: 23.4 mL/min (A) (by C-G formula based on SCr of 2.59 mg/dL (H)). Liver Function Tests: Recent Labs  Lab 06/06/22 0406 06/06/22 [id number] 06/07/22 0416 06/08/22 0516 06/11/22 0414  AST 149* 132* 116* 72* 52*  ALT 96* 88* 77* 66* 41  ALKPHOS 233* 230* 221* 222* 141*   BILITOT 2.1* 2.2* 2.9* 1.7* 1.1  PROT 6.4* 6.4* 7.0 7.2 7.6  ALBUMIN 2.1* 2.1* 2.3* 2.5* 2.9*    No results for input(s): "LIPASE", "AMYLASE" in the last 168 hours.  No results for input(s): "AMMONIA" in the last 168 hours. Coagulation Profile: No results for input(s): "INR", "PROTIME" in the last 168 hours. Cardiac Enzymes: No results for input(s): "CKTOTAL", "CKMB", "CKMBINDEX", "TROPONINI" in the last 168 hours. BNP (last 3 results) No results for input(s): "PROBNP" in the last 8760 hours. HbA1C: No results for input(s): "HGBA1C" in the last 72 hours.  CBG: Recent Labs  Lab 06/11/22  [id number] 06/11/22 0408 06/11/22 0642 06/11/22 0746 06/11/22 1039  GLUCAP 81 132* 336* 386* 288*    Lipid Profile: Recent Labs    06/10/22 0407 06/11/22 0414  TRIG 1,271* 1,430*    Thyroid Function Tests: No results for input(s): "TSH", "T4TOTAL", "FREET4", "T3FREE", "THYROIDAB" in the last 72 hours. Anemia Panel: No results for input(s): "VITAMINB12", "FOLATE", "FERRITIN", "TIBC", "IRON", "RETICCTPCT" in the last 72 hours. Sepsis Labs: Recent Labs  Lab 06/04/22 0948 06/04/22 1255  LATICACIDVEN 1.2 1.4     No results found for this or any previous visit (from the past 240 hour(s)).    Radiology Studies: ECHOCARDIOGRAM COMPLETE  Result Date: 06/10/2022    ECHOCARDIOGRAM REPORT   Patient Name:   Jennifer [name] Date of Exam: 06/10/2022 Medical Rec #:  [id number]         Height:       57.0 in Accession #:    [id number]        Weight:       128.3 lb Date of Birth:  1991/01/04         BSA:          1.489 m Patient Age:    32 years          BP:           147/88 mmHg Patient Gender: F                 HR:           95 bpm. Exam Location:  Inpatient Procedure: 2D Echo, Color Doppler and Cardiac Doppler Indications:    I34.0 Nonrheumatic mitral (valve) insufficiency  History:        Patient has prior history of Echocardiogram examinations, most                 recent 06/04/2022. CHF; Risk  Factors:Hypertension, Diabetes and                 Dyslipidemia.  Sonographer:    Raquel Sarna Senior RDCS Referring Phys: Wilmer Floor SIMMONS IMPRESSIONS  1. Left ventricular ejection fraction, by estimation, is 45 to 50%. The left ventricle has mildly decreased function. The left ventricle demonstrates global hypokinesis. Left ventricular diastolic parameters are indeterminate.  2. Right ventricular systolic function is normal. The right ventricular size is normal. There is normal pulmonary artery systolic pressure. The estimated right ventricular systolic pressure is [id number] mmHg.  3. Left atrial size was mildly dilated.  4. Right atrial size was mildly dilated.  5. A small pericardial effusion is present. There is no evidence of cardiac tamponade.  6. The mitral valve is grossly normal. Moderate mitral valve regurgitation. No evidence of mitral stenosis.  7. Tricuspid valve regurgitation is mild to moderate.  8. The aortic valve is grossly normal. Aortic valve regurgitation is trivial.  9. The inferior vena cava is normal in size with <50% respiratory variability, suggesting right atrial pressure of 8 mmHg. Comparison(s): Prior images reviewed side by side. LV function, MR, and TR all mildly improved compared to prior. FINDINGS  Left Ventricle: Left ventricular ejection fraction, by estimation, is 45 to 50%. The left ventricle has mildly decreased function. The left ventricle demonstrates global hypokinesis. The left ventricular internal cavity size was normal in size. There is  no left ventricular hypertrophy. Left ventricular diastolic parameters are indeterminate. Right Ventricle: The right ventricular size is normal. Right vetricular wall thickness was not well visualized. Right ventricular systolic function is normal. There is normal pulmonary  artery systolic pressure. The tricuspid regurgitant velocity is 2.46 m/s, and with an assumed right atrial pressure of 8 mmHg, the estimated right ventricular systolic pressure  is [id number] mmHg. Left Atrium: Left atrial size was mildly dilated. Right Atrium: Right atrial size was mildly dilated. Pericardium: A small pericardial effusion is present. There is no evidence of cardiac tamponade. Mitral Valve: The mitral valve is grossly normal. Moderate mitral valve regurgitation. No evidence of mitral valve stenosis. Tricuspid Valve: The tricuspid valve is grossly normal. Tricuspid valve regurgitation is mild to moderate. No evidence of tricuspid stenosis. Aortic Valve: The aortic valve is grossly normal. Aortic valve regurgitation is trivial. Pulmonic Valve: The pulmonic valve was not well visualized. Pulmonic valve regurgitation is mild. No evidence of pulmonic stenosis. Aorta: The aortic root, ascending aorta and aortic arch are all structurally normal, with no evidence of dilitation or obstruction. Venous: The inferior vena cava is normal in size with less than 50% respiratory variability, suggesting right atrial pressure of 8 mmHg. IAS/Shunts: The atrial septum is grossly normal.  LEFT VENTRICLE PLAX 2D LVIDd:         4.95 cm     Diastology LVIDs:         3.90 cm     LV e' medial:    5.55 cm/s LV PW:         0.90 cm     LV E/e' medial:  16.2 LV IVS:        0.60 cm     LV e' lateral:   7.51 cm/s LVOT diam:     1.80 cm     LV E/e' lateral: 12.0 LV SV:         47 LV SV Index:   31 LVOT Area:     2.54 cm  LV Volumes (MOD) LV vol d, MOD A2C: 98.1 ml LV vol d, MOD A4C: 92.3 ml LV vol s, MOD A2C: 45.2 ml LV vol s, MOD A4C: 46.7 ml LV SV MOD A2C:     52.9 ml LV SV MOD A4C:     92.3 ml LV SV MOD BP:      51.3 ml RIGHT VENTRICLE RV S prime:     12.40 cm/s TAPSE (M-mode): 2.0 cm LEFT ATRIUM             Index        RIGHT ATRIUM           Index LA diam:        2.80 cm 1.88 cm/m   RA Area:     15.50 cm LA Vol (A2C):   35.6 ml 23.90 ml/m  RA Volume:   38.70 ml  25.98 ml/m LA Vol (A4C):   26.5 ml 17.79 ml/m LA Biplane Vol: 31.1 ml 20.88 ml/m  AORTIC VALVE LVOT Vmax:   106.00 cm/s LVOT Vmean:  77.400  cm/s LVOT VTI:    0.184 m  AORTA Ao Root diam: 2.50 cm Ao Asc diam:  2.80 cm MITRAL VALVE               TRICUSPID VALVE MV Area (PHT): 4.17 cm    TR Peak grad:   24.2 mmHg MV Decel Time: 182 msec    TR Vmax:        246.00 cm/s MV E velocity: 90.10 cm/s MV A velocity: 96.40 cm/s  SHUNTS MV E/A ratio:  0.93        Systemic VTI:  0.18 m  Systemic Diam: 1.80 cm Buford Dresser MD Electronically signed by Buford Dresser MD Signature Date/Time: 06/10/2022/4:03:10 PM    Final     Scheduled Meds:  aspirin EC  81 mg Oral Daily   carvedilol  3.125 mg Oral BID WC   Chlorhexidine Gluconate Cloth  6 each Topical Daily   DULoxetine  30 mg Oral QHS   fenofibrate  160 mg Oral Daily   heparin injection (subcutaneous)  5,000 Units Subcutaneous Q8H   hydrALAZINE  50 mg Oral Q8H   insulin aspart  0-20 Units Subcutaneous TID WC   insulin aspart  0-5 Units Subcutaneous QHS   insulin regular human CONCENTRATED  150 Units Subcutaneous TID WC   ivabradine  7.5 mg Oral BID WC   levothyroxine  125 mcg Oral Q0600   lipase/protease/amylase  36,000 Units Oral TID   loratadine  10 mg Oral Daily   pantoprazole  40 mg Oral Daily   pregabalin  300 mg Oral BID   rosuvastatin  40 mg Oral Daily   sildenafil  20 mg Oral TID   sodium bicarbonate  650 mg Oral BID   sodium chloride flush  10-40 mL Intracatheter Q12H   spironolactone  25 mg Oral Daily   torsemide  80 mg Oral Daily   Continuous Infusions:  magnesium sulfate bolus IVPB 2 g (06/11/22 1039)     LOS: 13 days   Darliss Cheney, MD [facility]  06/11/2022, 10:47 AM   *Please note that this is a verbal dictation therefore any spelling or grammatical errors are due to the "[employer]" system interpretation.  Please page via [place] and do not message via secure chat for urgent patient care matters. Secure chat can be used for non urgent patient care matters.  How to contact the [facility] Attending or Consulting  provider [place] or covering provider during after hours [place], for this patient?  Check the care team in [facility] and look for a) attending/consulting [facility] provider listed and b) the [facility] team listed. Page or secure chat [phone]. Log into [email or web address] and use [place]'s universal password to access. If you do not have the password, please contact the hospital operator. Locate the [facility] provider you are looking for under [facility] and page to a number that you can be directly reached. If you still have difficulty reaching the provider, please page the [facility] (Director on Call) for the Hospitalists listed on amion for assistance.

## 2022-06-12 LAB — BASIC METABOLIC PANEL
Anion gap: 13 (ref 5–15)
Anion gap: 12 (ref 5–15)
BUN: 69 mg/dL — ABNORMAL HIGH (ref 6–20)
BUN: 73 mg/dL — ABNORMAL HIGH (ref 6–20)
CO2: 25 mmol/L (ref 22–32)
CO2: 25 mmol/L (ref 22–32)
Calcium: 9.4 mg/dL (ref 8.9–10.3)
Calcium: 9.4 mg/dL (ref 8.9–10.3)
Chloride: 93 mmol/L — ABNORMAL LOW (ref 98–111)
Chloride: 93 mmol/L — ABNORMAL LOW (ref 98–111)
Creatinine, Ser: 3.13 mg/dL — ABNORMAL HIGH (ref 0.44–1.00)
Creatinine, Ser: 2.97 mg/dL — ABNORMAL HIGH (ref 0.44–1.00)
GFR, Estimated: 20 mL/min — ABNORMAL LOW (ref >60)
GFR, Estimated: 21 mL/min — ABNORMAL LOW (ref >60)
Glucose, Bld: 124 mg/dL — ABNORMAL HIGH (ref 70–99)
Glucose, Bld: 162 mg/dL — ABNORMAL HIGH (ref 70–99)
Potassium: 4.5 mmol/L (ref 3.5–5.1)
Potassium: 5.8 mmol/L — ABNORMAL HIGH (ref 3.5–5.1)
Sodium: 131 mmol/L — ABNORMAL LOW (ref 135–145)
Sodium: 130 mmol/L — ABNORMAL LOW (ref 135–145)

## 2022-06-12 LAB — GLUCOSE, CAPILLARY
Glucose-Capillary: 94 mg/dL (ref 70–99)
Glucose-Capillary: 121 mg/dL — ABNORMAL HIGH (ref 70–99)
Glucose-Capillary: 136 mg/dL — ABNORMAL HIGH (ref 70–99)
Glucose-Capillary: 47 mg/dL — ABNORMAL LOW (ref 70–99)
Glucose-Capillary: 63 mg/dL — ABNORMAL LOW (ref 70–99)
Glucose-Capillary: 139 mg/dL — ABNORMAL HIGH (ref 70–99)
Glucose-Capillary: 130 mg/dL — ABNORMAL HIGH (ref 70–99)

## 2022-06-12 LAB — POCT I-STAT EG7
Acid-Base Excess: 1 mmol/L (ref 0.0–2.0)
Bicarbonate: 26.7 mmol/L (ref 20.0–28.0)
Calcium, Ion: 1.18 mmol/L (ref 1.15–1.40)
HCT: 31 % — ABNORMAL LOW (ref 36.0–46.0)
Hemoglobin: 10.5 g/dL — ABNORMAL LOW (ref 12.0–15.0)
O2 Saturation: 50 %
Potassium: 5 mmol/L (ref 3.5–5.1)
Sodium: 133 mmol/L — ABNORMAL LOW (ref 135–145)
TCO2: 28 mmol/L (ref 22–32)
pCO2, Ven: 45.7 mmHg (ref 44–60)
pH, Ven: 7.375 (ref 7.25–7.43)
pO2, Ven: 28 mmHg — CL (ref 32–45)

## 2022-06-12 LAB — COOXEMETRY PANEL
Carboxyhemoglobin: 2.4 % — ABNORMAL HIGH (ref 0.5–1.5)
Methemoglobin: <0.7 % (ref 0.0–1.5)
O2 Saturation: 65.7 %
Total hemoglobin: 9.4 g/dL — ABNORMAL LOW (ref 12.0–16.0)

## 2022-06-12 LAB — POTASSIUM: Potassium: 5.5 mmol/L — ABNORMAL HIGH (ref 3.5–5.1)

## 2022-06-12 LAB — TRIGLYCERIDES
Triglycerides: 1909 mg/dL — ABNORMAL HIGH (ref <150)
Triglycerides: 1851 mg/dL — ABNORMAL HIGH (ref <150)

## 2022-06-12 LAB — MAGNESIUM: Magnesium: 2.6 mg/dL — ABNORMAL HIGH (ref 1.7–2.4)

## 2022-06-12 MED ORDER — SODIUM ZIRCONIUM CYCLOSILICATE 10 G PO PACK: 10.0000 g | PACK | Freq: Two times a day (BID) | ORAL | Status: DC

## 2022-06-12 MED ORDER — HYDRALAZINE HCL 50 MG PO TABS: 50.0000 mg | ORAL_TABLET | Freq: Three times a day (TID) | ORAL | 0 refills | Status: DC

## 2022-06-12 MED ORDER — SPIRONOLACTONE 25 MG PO TABS: 25.0000 mg | ORAL_TABLET | Freq: Every day | ORAL | 0 refills | Status: DC

## 2022-06-12 MED ORDER — SODIUM CHLORIDE 0.9 % IV BOLUS
250.0000 mL | Freq: Once | INTRAVENOUS | Status: AC
  Administered 2022-06-12: 250 mL via INTRAVENOUS

## 2022-06-12 NOTE — Discharge Summary (Signed)
Physician Discharge Summary  Jennifer [name] [id number] DOB: June 16, 1990 DOA: 05/29/2022  PCP: Sue Lush, PA-C  Admit date: 05/29/2022 Discharge date: 06/12/2022 30 Day Unplanned Readmission Risk Score    Flowsheet Row ED to Hosp-Admission (Current) from 05/29/2022 in [place] ICU  30 Day Unplanned Readmission Risk Score (%) 70.45 Filed at 06/12/2022 1600       This score is the patient's risk of an unplanned readmission within 30 days of being discharged (0 -100%). The score is based on dignosis, age, lab data, medications, orders, and past utilization.   Low:  0-14.9   Medium: 15-21.9   High: 22-29.9   Extreme: 30 and above          Admitted From: Home Disposition: Home  Recommendations for Outpatient Follow-up:  Follow up with PCP in 2 to 3 days Please obtain BMP/CBC in 2 to 3 days Please follow-up with cardiology in 1 to 2 weeks Please follow up with your PCP on the following pending results: Unresulted Labs (From admission, onward)     Start     Ordered   06/12/22 [id number]  Basic metabolic panel  Once,   R       Question:  Specimen collection method  Answer:  Unit=Unit collect   06/12/22 1551   06/12/22 1735  Potassium  Once,   STAT        06/12/22 1735   06/12/22 [id number]  Basic metabolic panel  Daily,   R     Question:  Specimen collection method  Answer:  Unit=Unit collect   06/11/22 0909   06/11/22 0740  Triglycerides  5A & 5P,   R (with TIMED occurrences)     Question:  Specimen collection method  Answer:  Unit=Unit collect   06/11/22 0739   06/11/22 0500  Magnesium  Daily,   R     Question:  Specimen collection method  Answer:  Unit=Unit collect   06/10/22 0905   06/05/22 0500  Cooxemetry Panel (carboxy, met, total hgb, O2 sat)  Daily,   R     Question:  Specimen collection method  Answer:  Unit=Unit collect   06/04/22 [place]: None Equipment/Devices: None  Discharge Condition: Stable CODE STATUS: Full  code Diet recommendation: Cardiac  Subjective: Seen and examined earlier in the day on 06/12/2022.  Patient was very adamant on going home and was insisting that I discharged her.  I informed her that her creatinine was elevated and her triglyceride was also rising and after discussing with cardiology, they are recommending to keep her overnight.  She was not agreeable to that.  Due to her insistence, she was finally agreeable to repeat labs later today with the plan to discharge home if creatinine improves.  However when lab was drawn around 3 PM, potassium was high at 5.8 but it was verified/informed to me by RN that the sample was hemolyzed.  Second sample was sent which was once again hemolyzed.  Third sample was drawn by nurse herself and potassium was 5.0.  Due to all of this, patient was strongly advised that she stay overnight so that we can monitor her repeat labs in the morning.  Her torsemide was held from the beginning anyways.  By this time, it was already 6 PM.  Patient requested to talk to me.  I spoke to her over the phone.  The nurse was witness to our conversation.  I advised the patient that I strongly recommend against going home but patient started threatening that if she were not to be discharged, she is going to rip her IV and leave the hospital anyway because she wants to go home.  I informed her that if I discharged her, any consequences after discharge secondary to elevated creatinine and triglyceride, will be her responsibility.  She verbalized understanding and took the responsibility for that and requested discharging her home.  Brief/Interim Summary: 32 year old woman who presented to [facility] ED with abdominal pain, nausea and vomiting. PMHx significant for HTN, HLD, CHF (Echo 11/2021 EF 50-55%, no RWMAs, mildly elevated PASP), pulmonary HTN (s/p CardioMEMS implantation, on Corlanor and sildenafil), T2DM (followed by [facility]), NAFLD, hypothyroidism, astrocytoma, PCOS.   On  presentation to ED, patient was found to have an anion gap with acidosis consistent with DKA/hyperglycemia. Was also found to have a TG > 5000.  Concern for ongoing pancreatitis.  CT A/P 3/3 without acute process, no e/o pancreatitis; +hepatosplenomegaly. -Patient's anion gap has closed, she was transitioned off insulin drip, but her workup was noted for evolving anemia, suspicious for hemolytic anemia with elevated LFTs, and LDH, as well patient with significant difficulty with lab accuracy likely in the setting of her hypertriglyceridemia.  As well with significant volume overload, managed by advanced CHF team secondary to acute on chronic systolic CHF.  Details of hospitalization as below.     Diabetic ketoacidosis, type 2 diabetes with hyperglycemia AGMA secondary to above -she is on insulin U-500 at home, reports she has been compliant, reports she ran out of Dexcom refills, so she was not checking her CBGs.. -Insulin GTT DKA protocol discontinued after anion gap closed.  On 3/5.   Acute on chronic pancreatitis secondary to hypertriglyceridemia ?? multifactorial chylomicronemia syndrome -With known history of pancreatitis in the past due to a familial hypertriglyceridemia -[name] on home Repatha -Continue fenofibrate and statin. -started  on insulin gtt. for triglyceride> 5000, triglyceride improved but then got worse.  The best her triglyceride was was 1236. I reviewed patient's chart extensively, it appears that patient's lowest triglyceride level was 700 almost 2 years ago, and per patient, 1000-2000 is usually what her baseline is.  Based on this, we stopped her insulin drip on the morning of 06/11/2022, her triglyceride started to rise and jumped to around 1900, however patient remains asymptomatic and she tells me that this is also normal for her and that sometimes her triglyceride touch 5000 without her having any symptoms.  [name] seeing a specialist as outpatient.  As mentioned above, she  was strongly advised to stay overnight so we can monitor triglyceride and creatinine but she was discharged per her strong demand.   Acute on chronic diastolic/systolic CHF Anasarca cardiogenic shock Acute hypoxic respiratory failure/pulmonary hypertension. -cardiology  assistance greatly appreciated, started on milrinone drip.  [name] comfortable on room air with no shortness of breath. -Diuresis per cardiology, she received IV Lasix and then was transitioned to torsemide. -Drop in EF to 35%, started on Aldactone Cardiology has now cleared her for discharge and has signed off.  Per cardiology, discharged on following medications.   Her Coreg was stopped. Aspirin 81 mg  Spiro 25 mg  hydralazine 50 mg tid  ivabradine 7.5 bid   Sildenafil 20 mg 3 times daily. - restart sildenafil soon  Torsemide 80 mg daily.  However I advised her not to take her torsemide until she sees her PCP and I strongly advised her  to seek PCP visit in 2 to 3 days so they can repeat her BMP and check her creatinine.  She was also informed that if she has any questions regarding torsemide, she can call her PCP or cardiology office. Milrinone was stopped on the afternoon of 06/09/2022.  Cardiology will follow-up with her outpatient.   Anemia  -Due to anemia, with markedly elevated LDH, hematology input greatly appreciated, does not appear to be hemolysis . - -CT abdomen pelvis obtained, no evidence of retroperitoneal hematoma  -No evidence of B12 or iron deficiency   hyponatremia   Mild hyponatremia, stable.,  This is chronic.   AKI (likely in the setting of DKA, UTI) on baseline CKD stage 4 -Baseline creatinine appears to be between 1.6-1.8, peaked at 2.6 and was improving and was 2.64 yesterday but jumped to 3.13 today.  We stopped her torsemide per cardiology recommendation.  Repeated her creatinine which was 2.9 9 in the afternoon.  Still elevated.  Patient does not want to stay in the hospital.     E. Coli and  enterococcus UTI -She was treated with Rocephin initially, then transitioned to [facility] given sensitivities, it was discontinued given questionable hemolytic anemia .   Transaminitis, has chronic hepatosplenomegaly History of hepatic steatosis/fatty liver ?? 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   Hypothyroid TSH 5.284, Free T4 0.74.  - Continue levothyroxine   Nontypical chest pain -Nontypical, pleuritic, EKG nonacute, encouraged use incentive spirometer, negative troponins   Hyperkalemia Resolved.   Pain management: Tapered off all the medications.  No pain medications were prescribed at discharge.  Discharge plan was discussed with patient and/or family member and they verbalized understanding and agreed with it.  Discharge Diagnoses:  Principal Problem:   Hypertriglyceridemia Active Problems:   Acute renal failure superimposed on stage 3a chronic kidney disease (HCC)   History of astrocytoma of brain   Type 2 diabetes mellitus with hyperlipidemia (HCC)   Hypothyroidism   Chronic systolic CHF (congestive heart failure) (HCC)   Essential hypertension   Nonischemic cardiomyopathy (HCC)   Elevated liver enzymes   Pulmonary hypertension (HCC)   NASH (nonalcoholic steatohepatitis)   UTI (urinary tract infection) due to Enterococcus   E. coli UTI (urinary tract infection)   DKA (diabetic ketoacidosis) ([place])    Discharge Instructions  Discharge Instructions     Diet - low sodium heart healthy   Complete by: As directed    Discharge instructions   Complete by: As directed    You will be going home on reduced dose insulin.  120 units before meals. Hold off on bedtime insulin Check glucose frequently  If you notice it is climbing increase  next dose by 60 units and let your doctor who manages your insulin know   Increase activity slowly   Complete by: As directed       Allergies as of 06/12/2022       Reactions   Ketamine Other (See Comments)   In vegetative state for 15 minutes per pt   Erythromycin    unk   Fentanyl Nausea And Vomiting   Maitake Mushroom [maitake] Itching   Itchy throat   Penicillin V Itching   Gi upset   Shellfish Allergy Other (See Comments)   Pt has never had shellfish but tested positive on allergy test. Pt states contrast  in CT is okay   Morphine Itching, Rash   Penicillins Rash   Has patient had a PCN reaction causing immediate rash, facial/tongue/throat swelling, SOB or lightheadedness with hypotension: Y Has patient had a PCN reaction causing severe rash involving mucus membranes or skin necrosis: Y Has patient had a PCN reaction that required hospitalization: N Has patient had a PCN reaction occurring within the last 10 years: Y If all of the above answers are "NO", then may proceed with Cephalosporin use.   Prednisone Rash        Medication List     STOP taking these medications    carvedilol 3.125 MG tablet Commonly known as: COREG   ciprofloxacin 500 MG tablet Commonly known as: CIPRO   clindamycin 150 MG capsule Commonly known as: CLEOCIN   Torsemide 40 MG Tabs       TAKE these medications    albuterol 108 (90 Base) MCG/ACT inhaler Commonly known as: VENTOLIN HFA Inhale 1-2 puffs into the lungs every 6 (six) hours as needed for wheezing or shortness of breath.   aspirin EC 81 MG tablet Take 1 tablet (81 mg total) by mouth daily. Swallow whole.   Creon 36000 UNITS Cpep capsule Generic drug: lipase/protease/amylase Take 36,000 Units by mouth 3 (three) times daily.   DULoxetine 30 MG capsule Commonly known as: CYMBALTA Take 30 mg by mouth at bedtime.   ergocalciferol 1.25 MG (50000 UT) capsule Commonly known as: VITAMIN D2 Take 50,000 Units by mouth every  7 (seven) days.   fenofibrate 160 MG tablet Take 1 tablet (160 mg total) by mouth daily.   guaiFENesin-dextromethorphan 100-10 MG/5ML syrup Commonly known as: ROBITUSSIN DM Take 10 mLs by mouth every 6 (six) hours as needed for cough.   [name] 1/20 1-20 MG-MCG tablet Generic drug: norethindrone-ethinyl estradiol-FE Take 1 tablet by mouth daily.   HumuLIN R U-500 KwikPen 500 UNIT/ML KwikPen Generic drug: insulin regular human CONCENTRATED Inject 120 Units into the skin 3 (three) times daily with meals. What changed:  medication strength how much to take when to take this additional instructions   hydrALAZINE 50 MG tablet Commonly known as: APRESOLINE Take 1 tablet (50 mg total) by mouth every 8 (eight) hours.   Insulin Pen Needle 32G X 4 MM Misc 1 Device by Does not apply route QID. For use with insulin pens   ivabradine 7.5 MG Tabs tablet Commonly known as: CORLANOR Take 1 tablet (7.5 mg total) by mouth 2 (two) times daily with a meal.   levocetirizine 5 MG tablet Commonly known as: XYZAL Take 5 mg by mouth daily.   levothyroxine 125 MCG tablet Commonly known as: SYNTHROID Take 1 tablet (125 mcg total) by mouth daily at 6 (six) AM.   nitrofurantoin (macrocrystal-monohydrate) 100 MG capsule Commonly known as: MACROBID Take 1 capsule (100 mg total) by mouth every 12 (twelve) hours.   pantoprazole 40 MG tablet Commonly known as: PROTONIX Take 40 mg by mouth daily.   pregabalin 150 MG capsule Commonly known as: LYRICA Take 300 mg by mouth at bedtime.   prochlorperazine 5 MG tablet Commonly known as: COMPAZINE Take 5 mg by mouth daily.   Repatha 140 MG/ML Sosy Generic drug: Evolocumab Inject 140 mg into the skin See admin instructions. Inject 140 mg subcutaneously every other Monday evening   rosuvastatin 40 MG tablet Commonly known as: CRESTOR Take 40 mg by mouth daily.   sildenafil 20 MG tablet Commonly known as: REVATIO Take 1 tablet (20 mg total)  by mouth 3 (three) times daily.   sodium bicarbonate 650 MG tablet Take 650 mg by mouth 2 (two) times daily.   spironolactone 25 MG tablet Commonly known as: ALDACTONE Take 1 tablet (25 mg total) by mouth daily. Start taking on: June 13, 2022   tretinoin 0.05 % cream Commonly known as: RETIN-A Apply 1 application topically at bedtime.        Follow-up Information     Sue Lush, PA-C. Call in 1 week(s).   Specialty: Physician Assistant Contact information: [place] [phone] [place] Follow up on 06/22/2022.   Specialty: Cardiology Why: Follow up in [facility] 06/22/22 3:30 pm Luvenia Heller, free valet Contact information: [address] [id number] [place] [address] [phone]               Allergies  Allergen Reactions   Ketamine Other (See Comments)    In vegetative state for 15 minutes per pt   Erythromycin     unk   Fentanyl Nausea And Vomiting   Maitake Mushroom [Maitake] Itching    Itchy throat   Penicillin V Itching    Gi upset   Shellfish Allergy Other (See Comments)    Pt has never had shellfish but tested positive on allergy test. Pt states contrast in CT is okay   Morphine Itching and Rash   Penicillins Rash    Has patient had a PCN reaction causing immediate rash, facial/tongue/throat swelling, SOB or lightheadedness with hypotension: Y Has patient had a PCN reaction causing severe rash involving mucus membranes or skin necrosis: Y Has patient had a PCN reaction that required hospitalization: N Has patient had a PCN reaction occurring within the last 10 years: Y If all of the above answers are "NO", then may proceed with Cephalosporin use.    Prednisone Rash    Consultations: Cardiology   Procedures/Studies: ECHOCARDIOGRAM COMPLETE  Result Date: 06/10/2022     ECHOCARDIOGRAM REPORT   Patient Name:   Jennifer [name] Date of Exam: 06/10/2022 Medical Rec #:  [id number]         Height:       57.0 in Accession #:    [id number]        Weight:       128.3 lb Date of Birth:  [birth year]         BSA:          1.489 m Patient Age:    32 years          BP:           147/88 mmHg Patient Gender: F                 HR:           95 bpm. Exam Location:  Inpatient Procedure: 2D Echo, Color Doppler and Cardiac Doppler Indications:    I34.0 Nonrheumatic mitral (valve) insufficiency  History:        Patient has prior history of Echocardiogram examinations, most                 recent 06/04/2022. CHF; Risk Factors:Hypertension, Diabetes and                 Dyslipidemia.  Sonographer:    Raquel Sarna Senior RDCS Referring Phys: Wilmer Floor SIMMONS IMPRESSIONS  1. Left ventricular ejection  fraction, by estimation, is 45 to 50%. The left ventricle has mildly decreased function. The left ventricle demonstrates global hypokinesis. Left ventricular diastolic parameters are indeterminate.  2. Right ventricular systolic function is normal. The right ventricular size is normal. There is normal pulmonary artery systolic pressure. The estimated right ventricular systolic pressure is [id number] mmHg.  3. Left atrial size was mildly dilated.  4. Right atrial size was mildly dilated.  5. A small pericardial effusion is present. There is no evidence of cardiac tamponade.  6. The mitral valve is grossly normal. Moderate mitral valve regurgitation. No evidence of mitral stenosis.  7. Tricuspid valve regurgitation is mild to moderate.  8. The aortic valve is grossly normal. Aortic valve regurgitation is trivial.  9. The inferior vena cava is normal in size with <50% respiratory variability, suggesting right atrial pressure of 8 mmHg. Comparison(s): Prior images reviewed side by side. LV function, MR, and TR all mildly improved compared to prior. FINDINGS  Left Ventricle: Left ventricular ejection fraction, by estimation, is  45 to 50%. The left ventricle has mildly decreased function. The left ventricle demonstrates global hypokinesis. The left ventricular internal cavity size was normal in size. There is  no left ventricular hypertrophy. Left ventricular diastolic parameters are indeterminate. Right Ventricle: The right ventricular size is normal. Right vetricular wall thickness was not well visualized. Right ventricular systolic function is normal. There is normal pulmonary artery systolic pressure. The tricuspid regurgitant velocity is 2.46 m/s, and with an assumed right atrial pressure of 8 mmHg, the estimated right ventricular systolic pressure is [id number] mmHg. Left Atrium: Left atrial size was mildly dilated. Right Atrium: Right atrial size was mildly dilated. Pericardium: A small pericardial effusion is present. There is no evidence of cardiac tamponade. Mitral Valve: The mitral valve is grossly normal. Moderate mitral valve regurgitation. No evidence of mitral valve stenosis. Tricuspid Valve: The tricuspid valve is grossly normal. Tricuspid valve regurgitation is mild to moderate. No evidence of tricuspid stenosis. Aortic Valve: The aortic valve is grossly normal. Aortic valve regurgitation is trivial. Pulmonic Valve: The pulmonic valve was not well visualized. Pulmonic valve regurgitation is mild. No evidence of pulmonic stenosis. Aorta: The aortic root, ascending aorta and aortic arch are all structurally normal, with no evidence of dilitation or obstruction. Venous: The inferior vena cava is normal in size with less than 50% respiratory variability, suggesting right atrial pressure of 8 mmHg. IAS/Shunts: The atrial septum is grossly normal.  LEFT VENTRICLE PLAX 2D LVIDd:         4.95 cm     Diastology LVIDs:         3.90 cm     LV e' medial:    5.55 cm/s LV PW:         0.90 cm     LV E/e' medial:  16.2 LV IVS:        0.60 cm     LV e' lateral:   7.51 cm/s LVOT diam:     1.80 cm     LV E/e' lateral: 12.0 LV SV:         47 LV SV  Index:   31 LVOT Area:     2.54 cm  LV Volumes (MOD) LV vol d, MOD A2C: 98.1 ml LV vol d, MOD A4C: 92.3 ml LV vol s, MOD A2C: 45.2 ml LV vol s, MOD A4C: 46.7 ml LV SV MOD A2C:     52.9 ml LV SV MOD A4C:     92.3  ml LV SV MOD BP:      51.3 ml RIGHT VENTRICLE RV S prime:     12.40 cm/s TAPSE (M-mode): 2.0 cm LEFT ATRIUM             Index        RIGHT ATRIUM           Index LA diam:        2.80 cm 1.88 cm/m   RA Area:     15.50 cm LA Vol (A2C):   35.6 ml 23.90 ml/m  RA Volume:   38.70 ml  25.98 ml/m LA Vol (A4C):   26.5 ml 17.79 ml/m LA Biplane Vol: 31.1 ml 20.88 ml/m  AORTIC VALVE LVOT Vmax:   106.00 cm/s LVOT Vmean:  77.400 cm/s LVOT VTI:    0.184 m  AORTA Ao Root diam: 2.50 cm Ao Asc diam:  2.80 cm MITRAL VALVE               TRICUSPID VALVE MV Area (PHT): 4.17 cm    TR Peak grad:   24.2 mmHg MV Decel Time: 182 msec    TR Vmax:        246.00 cm/s MV E velocity: 90.10 cm/s MV A velocity: 96.40 cm/s  SHUNTS MV E/A ratio:  0.93        Systemic VTI:  0.18 m                            Systemic Diam: 1.80 cm Buford Dresser MD Electronically signed by Buford Dresser MD Signature Date/Time: 06/10/2022/4:03:10 PM    Final    [place] EKG SITE RITE  Result Date: 06/08/2022 If Site Rite image not attached, placement could not be confirmed due to current cardiac rhythm.  ECHOCARDIOGRAM COMPLETE  Result Date: 06/04/2022    ECHOCARDIOGRAM REPORT   Patient Name:   Jennifer [name] Date of Exam: 06/04/2022 Medical Rec #:  [id number]         Height:       57.0 in Accession #:    [id number]        Weight:       158.5 lb Date of Birth:  December 24, 1990         BSA:          1.629 m Patient Age:    32 years          BP:           112/75 mmHg Patient Gender: F                 HR:           66 bpm. Exam Location:  Inpatient Procedure: 2D Echo, Color Doppler, Cardiac Doppler and Intracardiac            Opacification Agent STAT ECHO                                 MODIFIED REPORT:   This report was modified by Rudean Haskell MD on 06/04/2022 due to [place].  Indications:     [id number] Acute systolic (congestive) heart failure  History:         Patient has  prior history of Echocardiogram examinations, most                  recent 12/03/2021. CHF; Risk Factors:Hypertension, Diabetes and                  Dyslipidemia.  Sonographer:     Raquel Sarna Senior RDCS Referring Phys:  [id number] [place] Diagnosing Phys: Rudean Haskell MD IMPRESSIONS  1. Left ventricular ejection fraction, by estimation, is 30 to 35%. The left ventricle has moderately decreased function. Left ventricular endocardial border not optimally defined to evaluate regional wall motion. Left ventricular diastolic parameters are indeterminate.  2. Right ventricular systolic function is normal. The right ventricular size is is dilated. There is mildly elevated pulmonary artery systolic pressure. The estimated right ventricular systolic pressure is Q000111Q mmHg.  3. A small pericardial effusion is present. The pericardial effusion is posterior to the left ventricle.  4. The mitral valve is normal in structure. Moderate to severe mitral valve regurgitation.  5. TV best assessed in image 17. Tricuspid valve regurgitation is severe.  6. The aortic valve was not well visualized. Aortic valve regurgitation is mild.  7. Pulmonic valve regurgitation is moderate.  8. The inferior vena cava is dilated in size with <50% respiratory variability, suggesting right atrial pressure of 15 mmHg. Comparison(s): LV function is worse, mitral regurgitation is worse, tricuspid regurgitation is worse. Conclusion(s)/Recommendation(s): Reviewed with primary team. FINDINGS  Left Ventricle: Left ventricular ejection fraction, by estimation, is 30 to 35%. The left ventricle has moderately decreased function. Left ventricular endocardial border not optimally defined to evaluate regional wall motion. Definity contrast agent was given IV to delineate  the left ventricular endocardial borders. The left ventricular internal cavity size was normal in size. There is no left ventricular hypertrophy. Left ventricular diastolic parameters are indeterminate. Right Ventricle: The right ventricular size is is dilated. Right vetricular wall thickness was not well visualized. Right ventricular systolic function is normal. There is mildly elevated pulmonary artery systolic pressure. The tricuspid regurgitant velocity is 2.35 m/s, and with an assumed right atrial pressure of 15 mmHg, the estimated right ventricular systolic pressure is Q000111Q mmHg. Left Atrium: Left atrial size was normal in size. Right Atrium: Right atrial size was normal in size. Pericardium: A small pericardial effusion is present. The pericardial effusion is posterior to the left ventricle. Mitral Valve: The mitral valve is normal in structure. Moderate to severe mitral valve regurgitation. Tricuspid Valve: TV best assessed in image 17. The tricuspid valve is normal in structure. Tricuspid valve regurgitation is severe. Aortic Valve: The aortic valve was not well visualized. Aortic valve regurgitation is mild. Pulmonic Valve: The pulmonic valve was normal in structure. Pulmonic valve regurgitation is moderate. Aorta: The aortic root and ascending aorta are structurally normal, with no evidence of dilitation. Venous: The inferior vena cava is dilated in size with less than 50% respiratory variability, suggesting right atrial pressure of 15 mmHg. IAS/Shunts: No atrial level shunt detected by color flow Doppler.  LEFT VENTRICLE PLAX 2D LVIDd:         4.20 cm     Diastology LVIDs:         3.05 cm     LV e' medial:    7.18 cm/s LV PW:         1.10 cm     LV E/e' medial:  15.3 LV IVS:        0.90 cm     LV e' lateral:   11.30  cm/s LVOT diam:     1.80 cm     LV E/e' lateral: 9.7 LV SV:         44 LV SV Index:   27 LVOT Area:     2.54 cm  LV Volumes (MOD) LV vol d, MOD A4C: 83.1 ml LV vol s, MOD A4C: 52.4 ml LV SV  MOD A4C:     83.1 ml RIGHT VENTRICLE RV S prime:     12.70 cm/s TAPSE (M-mode): 2.4 cm LEFT ATRIUM             Index        RIGHT ATRIUM           Index LA diam:        3.30 cm 2.03 cm/m   RA Area:     12.30 cm LA Vol (A2C):   39.7 ml 24.36 ml/m  RA Volume:   28.20 ml  17.31 ml/m LA Vol (A4C):   37.2 ml 22.83 ml/m LA Biplane Vol: 38.4 ml 23.57 ml/m  AORTIC VALVE LVOT Vmax:   97.90 cm/s LVOT Vmean:  69.900 cm/s LVOT VTI:    0.173 m  AORTA Ao Root diam: 2.20 cm Ao Asc diam:  3.00 cm MITRAL VALVE                TRICUSPID VALVE MV Area (PHT): 4.77 cm     TR Peak grad:   22.1 mmHg MV Decel Time: 159 msec     TR Vmax:        235.00 cm/s MV E velocity: 110.00 cm/s MV A velocity: 65.70 cm/s   SHUNTS MV E/A ratio:  1.67         Systemic VTI:  0.17 m                             Systemic Diam: 1.80 cm Rudean Haskell MD Electronically signed by Rudean Haskell MD Signature Date/Time: 06/04/2022/2:16:27 PM    Final (Updated)    DG Chest Port 1 View  Result Date: 06/03/2022 CLINICAL DATA:  Hypoxia. EXAM: PORTABLE CHEST 1 VIEW COMPARISON:  Chest x-ray dated June 01, 2022. FINDINGS: New right upper extremity PICC line with tip in the proximal right atrium. Unchanged cardiomegaly. Diffuse interstitial thickening and hazy airspace opacities throughout both lungs have mildly worsened in the interval. No pneumothorax or large pleural effusion. No acute osseous abnormality. IMPRESSION: 1. Mildly worsened pulmonary edema. 2. New right upper extremity PICC line with tip in the proximal right atrium. Electronically Signed   By: Titus Dubin M.D.   On: 06/03/2022 10:19   CT ABDOMEN PELVIS WO CONTRAST  Result Date: 06/01/2022 CLINICAL DATA:  anemia, rule out retroperitoneal bleed or hematoma EXAM: CT ABDOMEN AND PELVIS WITHOUT CONTRAST TECHNIQUE: Multidetector CT imaging of the abdomen and pelvis was performed following the standard protocol without IV contrast. RADIATION DOSE REDUCTION: This exam was performed  according to the departmental dose-optimization program which includes automated exposure control, adjustment of the mA and/or kV according to patient size and/or use of iterative reconstruction technique. COMPARISON:  Chest XR, concurrent. CT AP, 05/29/2022 and 03/30/2022. CT CAP, 05/12/2022. FINDINGS: Lower chest: Cardiomegaly with LEFT ventricular enlargement, greater than expected in a patient of this age. Hypodensity of the cardiac blood pool. Ground-glass opacities and interlobular septal thickening at the lung bases. Trace LEFT pleural effusion. Hepatobiliary: Hepatomegaly. Diffuse hypodensity of liver. No gallstones, gallbladder wall thickening, or biliary dilatation. Pancreas:  No pancreatic ductal dilatation or surrounding inflammatory changes. Spleen: Splenomegaly.  No focal abnormality. Adrenals/Urinary Tract: Adrenal glands are unremarkable. Kidneys are normal, without renal calculi, focal lesion, or hydronephrosis. Mild distention of the urinary bladder. Stomach/Bowel: Stomach is within normal limits. Appendix is not definitively visualized. No evidence of bowel wall thickening, distention, or inflammatory changes. Vascular/Lymphatic: Mild burden of distal aortic atherosclerosis, though greater than expected in a patient of this age. No enlarged abdominal or pelvic lymph nodes. Reproductive: Uterus and adnexa are unremarkable. Other: Rotund abdomen. Mild body wall edema. Multifocal anterior abdominal wall subcutaneous contusions, likely injection sites. Similar appearance of subcutaneous tissue thickening at the RIGHT groin and months pubis. No abdominopelvic ascites. Musculoskeletal: No acute or significant osseous findings. IMPRESSION: 1. No acute abdominopelvic process. 2. Cardiomegaly with LEFT ventricular enlargement, greater than expected in a patient of this age. Hypodensity of the cardiac blood pool as can be seen with anemia. 3. Bibasilar ground-glass opacities and interlobular septal  thickening, suspicious for developing pulmonary edema. 4. Hepatomegaly and steatosis. Additional incidental, chronic and senescent findings as above. Electronically Signed   By: Michaelle Birks M.D.   On: 06/01/2022 19:09   [place] EKG SITE RITE  Result Date: 06/01/2022 If Site Rite image not attached, placement could not be confirmed due to current cardiac rhythm.  DG Chest Port 1 View  Result Date: 06/01/2022 CLINICAL DATA:  Shortness of breath. EXAM: PORTABLE CHEST 1 VIEW COMPARISON:  May 31, 2022. FINDINGS: Stable cardiomediastinal silhouette. Minimal bibasilar subsegmental atelectasis is noted. Bony thorax is unremarkable. IMPRESSION: Minimal bibasilar subsegmental atelectasis. Electronically Signed   By: Marijo Conception M.D.   On: 06/01/2022 13:34   DG CHEST PORT 1 VIEW  Result Date: 05/31/2022 CLINICAL DATA:  Shortness of breath. Wheezing. EXAM: PORTABLE CHEST 1 VIEW COMPARISON:  Chest radiograph 05/12/2022. Lung bases from abdominal CT 05/29/2022 FINDINGS: Heart is normal in size. Stable mediastinal contours. There is mild diffuse peribronchial thickening. Question of trace left pleural effusion. Mild bibasilar atelectasis without confluent airspace disease. No pneumothorax. Limited assessment, no acute osseous findings. IMPRESSION: Mild diffuse peribronchial thickening, can be seen with asthma or bronchitis. Question trace left pleural effusion. Electronically Signed   By: Keith Rake M.D.   On: 05/31/2022 16:17   CT ABDOMEN PELVIS W CONTRAST  Result Date: 05/29/2022 CLINICAL DATA:  Pancreatitis, acute, severe EXAM: CT ABDOMEN AND PELVIS WITHOUT AND WITH CONTRAST TECHNIQUE: Multidetector CT imaging of the abdomen and pelvis was performed following the standard protocol before and following the bolus administration of intravenous contrast. RADIATION DOSE REDUCTION: This exam was performed according to the departmental dose-optimization program which includes automated exposure control, adjustment  of the mA and/or kV according to patient size and/or use of iterative reconstruction technique. CONTRAST:  49mL OMNIPAQUE IOHEXOL 300 MG/ML  SOLN COMPARISON:  CT CAP, 05/12/2022. CT AP, 03/30/2022. MRCP, 05/11/2022. FINDINGS: Lower chest: No acute abnormality. Similar appearance of calcification at the LEFT lung base. Hepatobiliary: Hepatomegaly. No focal liver abnormality is seen. No gallstones, gallbladder wall thickening, or biliary dilatation. Pancreas: No pancreatic ductal dilatation or surrounding inflammatory changes. Spleen: Splenomegaly.  No focal abnormality. Adrenals/Urinary Tract: Adrenal glands are unremarkable. Kidneys are normal, without renal calculi, focal lesion, or hydronephrosis. Bladder is unremarkable. Stomach/Bowel: Stomach is within normal limits. Appendix is not definitively visualized. No evidence of bowel wall thickening, distention, or inflammatory changes. Vascular/Lymphatic: Distal aortic and bifurcation calcified and noncalcified atherosclerosis, mild burden. No enlarged abdominal or pelvic lymph nodes. Reproductive: Uterus and bilateral adnexa are  unremarkable. Other: Rotund abdomen. Anterior abdominal wall contusions, likely injection sites. Improved appearance of the subcutaneous tissue thickening at the RIGHT groin and mons pubis, likely resolving cellulitis versus sebaceous cyst. No abdominal wall hernia or abnormality. No abdominopelvic ascites. Musculoskeletal: No acute or significant osseous findings. IMPRESSION: 1. No acute abdominopelvic process. 2. Hepatosplenomegaly. Additional incidental, chronic and senescent findings as above. Electronically Signed   By: Michaelle Birks M.D.   On: 05/29/2022 12:13     Discharge Exam: Vitals:   06/12/22 1237 06/12/22 1624  BP: (!) 116/57   Pulse: 77   Resp: 15   Temp:  98.5 F (36.9 C)  SpO2: 99%    Vitals:   06/12/22 0757 06/12/22 0926 06/12/22 1237 06/12/22 1624  BP:  127/75 (!) 116/57   Pulse:  87 77   Resp:  18 15    Temp: 98.3 F (36.8 C)   98.5 F (36.9 C)  TempSrc: Oral   Oral  SpO2:  99% 99%   Weight:      Height:        General: Pt is alert, awake, not in acute distress Cardiovascular: RRR, S1/S2 +, no rubs, no gallops Respiratory: CTA bilaterally, no wheezing, no rhonchi Abdominal: Soft, NT, ND, bowel sounds + Extremities: no edema, no cyanosis    The results of significant diagnostics from this hospitalization (including imaging, microbiology, ancillary and laboratory) are listed below for reference.     Microbiology: No results found for this or any previous visit (from the past 240 hour(s)).   Labs: BNP (last 3 results) Recent Labs    04/05/22 1009 06/04/22 0400 06/05/22 0432  BNP 36.9 550.1* [id number]*   Basic Metabolic Panel: Recent Labs  Lab 06/08/22 0516 06/09/22 0422 06/09/22 1109 06/09/22 1738 06/10/22 0407 06/11/22 0414 06/12/22 0409 06/12/22 1459  NA 133*  --    < > 133* 126* 129* 131* 130*  K 3.6  --    < > 4.4 3.6 4.7 4.5 5.8*  CL 86*  --    < > 89* 88* 92* 93* 93*  CO2 31  --    < > 25 27 25 25 25   GLUCOSE 82  --    < > 140* 387* 168* 124* 162*  BUN 41*  --    < > 56* 53* 52* 69* 73*  CREATININE 2.05*  --    < > 3.22* 2.64* 2.59* 3.13* 2.97*  CALCIUM 8.7*  --    < > 9.2 8.7* 9.4 9.4 9.4  MG 2.3 2.2  --  2.0  --  1.9 2.6*  --    < > = values in this interval not displayed.   Liver Function Tests: Recent Labs  Lab 06/06/22 0406 06/06/22 [id number] 06/07/22 0416 06/08/22 0516 06/11/22 0414  AST 149* 132* 116* 72* 52*  ALT 96* 88* 77* 66* 41  ALKPHOS 233* 230* 221* 222* 141*  BILITOT 2.1* 2.2* 2.9* 1.7* 1.1  PROT 6.4* 6.4* 7.0 7.2 7.6  ALBUMIN 2.1* 2.1* 2.3* 2.5* 2.9*   No results for input(s): "LIPASE", "AMYLASE" in the last 168 hours. No results for input(s): "AMMONIA" in the last 168 hours. CBC: Recent Labs  Lab 06/06/22 0406 06/07/22 0416 06/08/22 0516  WBC 10.4 11.9* 12.7*  HGB 8.8* 9.4* 9.7*  HCT 24.2* 25.0* 27.9*  MCV 86.1 84.7 86.9   PLT 203 236 241   Cardiac Enzymes: No results for input(s): "CKTOTAL", "CKMB", "CKMBINDEX", "TROPONINI" in the last 168 hours. BNP: Invalid input(s): "POCBNP"  CBG: Recent Labs  Lab 06/12/22 0635 06/12/22 1119 06/12/22 1142 06/12/22 1236 06/12/22 1625  GLUCAP 136* 47* 63* 139* 130*   D-Dimer No results for input(s): "DDIMER" in the last 72 hours. Hgb A1c No results for input(s): "HGBA1C" in the last 72 hours. Lipid Profile Recent Labs    06/11/22 1629 06/12/22 0409  TRIG 1,635* 1,909*   Thyroid function studies No results for input(s): "TSH", "T4TOTAL", "T3FREE", "THYROIDAB" in the last 72 hours.  Invalid input(s): "FREET3" Anemia work up No results for input(s): "VITAMINB12", "FOLATE", "FERRITIN", "TIBC", "IRON", "RETICCTPCT" in the last 72 hours. Urinalysis    Component Value Date/Time   COLORURINE YELLOW 05/29/2022 0919   APPEARANCEUR CLOUDY (A) 05/29/2022 0919   LABSPEC 1.020 05/29/2022 0919   PHURINE 5.5 05/29/2022 0919   GLUCOSEU >1,000 (A) 05/29/2022 0919   HGBUR SMALL (A) 05/29/2022 0919   BILIRUBINUR NEGATIVE 05/29/2022 0919   KETONESUR NEGATIVE 05/29/2022 0919   PROTEINUR 100 (A) 05/29/2022 0919   NITRITE NEGATIVE 05/29/2022 0919   LEUKOCYTESUR LARGE (A) 05/29/2022 0919   Sepsis Labs Recent Labs  Lab 06/06/22 0406 06/07/22 0416 06/08/22 0516  WBC 10.4 11.9* 12.7*   Microbiology No results found for this or any previous visit (from the past 240 hour(s)).   Time coordinating discharge: Over 30 minutes  SIGNED:   Darliss Cheney, MD  [facility] 06/12/2022, 5:54 PM *Please note that this is a verbal dictation therefore any spelling or grammatical errors are due to the "[employer]" system interpretation. If 7PM-7AM, please contact night-coverage [email or web address]

## 2022-06-12 NOTE — Progress Notes (Addendum)
PROGRESS NOTE    Jennifer [name]  [id number] DOB: 09-Apr-1990 DOA: 05/29/2022 PCP: Elinor Parkinson   Brief Narrative:  32 year old woman who presented to [facility] ED with abdominal pain, nausea and vomiting. PMHx significant for HTN, HLD, CHF (Echo 11/2021 EF 50-55%, no RWMAs, mildly elevated PASP), pulmonary HTN (s/p CardioMEMS implantation, on Corlanor and sildenafil), T2DM (followed by [facility]), NAFLD, hypothyroidism, astrocytoma, PCOS.   On presentation to ED, patient was found to have an anion gap with acidosis consistent with DKA/hyperglycemia. Was also found to have a TG > 5000.  Concern for ongoing pancreatitis.  CT A/P 3/3 without acute process, no e/o pancreatitis; +hepatosplenomegaly. -Patient's anion gap has closed, she was transitioned off insulin drip, but her workup was noted for evolving anemia, suspicious for hemolytic anemia with elevated LFTs, and LDH, as well patient with significant difficulty with lab accuracy likely in the setting of her hypertriglyceridemia.  As well with significant volume overload, managed by advanced CHF team secondary to acute on chronic systolic CHF.  Assessment & Plan:   Principal Problem:   Hypertriglyceridemia Active Problems:   Acute renal failure superimposed on stage 3a chronic kidney disease (HCC)   History of astrocytoma of brain   Type 2 diabetes mellitus with hyperlipidemia (HCC)   Hypothyroidism   Chronic systolic CHF (congestive heart failure) (HCC)   Essential hypertension   Nonischemic cardiomyopathy (HCC)   Elevated liver enzymes   Pulmonary hypertension (HCC)   NASH (nonalcoholic steatohepatitis)   UTI (urinary tract infection) due to Enterococcus   E. coli UTI (urinary tract infection)   DKA (diabetic ketoacidosis) (HCC)  Diabetic ketoacidosis, type 2 diabetes with hyperglycemia AGMA secondary to above -she is on insulin U-500 at home, reports she has been compliant, reports she ran out of Dexcom refills,  so she was not checking her CBGs.. -Insulin GTT DKA protocol has been discontinued after anion gap closed.  On 3/5. -On insulin gtt due to hypertriglyceridemia protocol, I will now stop her insulin drip and place her back on her home regimen of highly concentrated insulin.   Acute on chronic pancreatitis secondary to hypertriglyceridemia ?? multifactorial chylomicronemia syndrome -With known history of pancreatitis in the past due to a familial hypertriglyceridemia -[name] on home Repatha -Continue fenofibrate and statin. -started  on insulin gtt. for triglyceride> 5000, triglyceride improved but then got worse.  Now improving again and 1236 today, I reviewed patient's chart extensively, it appears that patient's lowest triglyceride level for 700 almost 2 years ago, and per patient, 1000-1500 is usually what her baseline is.  Based on this, we stopped her insulin drip on the morning of 06/11/2022, her triglyceride is creeping up and is around 2000 now however patient remains asymptomatic and she tells me that this is also normal for her and that sometimes her triglyceride touch 5000 without her having any symptoms.  [name] seeing a specialist as outpatient.  [name] requesting a discharge however due to her rising creatinine, after discussion with cardiology, we gave her some fluids and although cardiology recommended keeping overnight but since patient is eager to go home, I plan to repeat her creatinine later today and if I see improvement, we will discharge her home.  We are holding torsemide.    Acute on chronic diastolic/systolic CHF Anasarca cardiogenic shock Acute hypoxic respiratory failure/pulmonary hypertension. -cardiology  assistance greatly appreciated, started on milrinone drip.  [name] comfortable on room air with no shortness of breath. -Diuresis per cardiology, she received IV  Lasix and then was transitioned to torsemide. -Drop in EF to 35%, started on Aldactone Cardiology has now  cleared her for discharge and has signed off.  Per cardiology, discharged on following medications.   Her Coreg was stopped. Aspirin 81 mg  Spiro 25 mg  hydralazine 50 mg tid  ivabradine 7.5 bid   Sildenafil 20 mg 3 times daily. - restart sildenafil soon  Torsemide 80 mg daily. Milrinone was stopped on the afternoon of 06/09/2022.  Cardiology will follow-up with her outpatient.   Anemia  -Due to anemia, with markedly elevated LDH, hematology input greatly appreciated, does not appear to be hemolysis . - -CT abdomen pelvis obtained, no evidence of retroperitoneal hematoma  -No evidence of B12 or iron deficiency   Hyperkalemia/hyponatremia   Mild hyponatremia, slightly worse and 129 today.  No hypokalemia anymore.   AKI (likely in the setting of DKA, UTI) on baseline CKD stage 4 -Baseline creatinine appears to be between 1.6-1.8, peaked at 2.6 and was improving and was 2.64 yesterday but jumped to 3.13 today. Avoid nephrotoxic agents as able. Ensure adequate renal perfusion.  Hold torsemide per cardiology recommendation.   E. Coli and enterococcus UTI -She was treated with Rocephin initially, then transitioned to Macrobid given sensitivities, it was discontinued given questionable hemolytic anemia .   Transaminitis, has chronic hepatosplenomegaly History of hepatic steatosis/fatty liver ?? 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   Hypothyroid TSH 5.284, Free T4 0.74.  - Continue levothyroxine   Deconditioning - PT consulted   Nontypical chest pain -Nontypical, pleuritic, EKG nonacute, encouraged use incentive spirometer, negative troponins   Hyperkalemia Resolved.  Pain management: Started tapering few days ago and transitioned from Dilaudid to oral  oxycodone on 06/11/2022.  Will stop pain medications now.  DVT prophylaxis: heparin injection 5,000 Units Start: 06/05/22 1400 Place and maintain sequential compression device Start: 06/02/22 1114 SCDs Start: 05/29/22 1400   Code Status: Full Code  Family Communication:  None present at bedside.  Plan of care discussed with patient in length and he/she verbalized understanding and agreed with it.  Status is: Inpatient Remains inpatient appropriate because: Monitor for few hours and repeating BMP later today.   Estimated body mass index is 28.58 kg/m as calculated from the following:   Height as of this encounter: 4\' 9"  (1.448 m).   Weight as of this encounter: 59.9 kg.    Nutritional Assessment: Body mass index is 28.58 kg/m.Marland Kitchen Seen by dietician.  I agree with the assessment and plan as outlined below: Nutrition Status:        . Skin Assessment: I have examined the patient's skin and I agree with the wound assessment as performed by the wound care RN as outlined below:    Consultants:  Cardiology  Procedures:  As above  Antimicrobials:  Anti-infectives (From admission, onward)    Start     Dose/Rate Route Frequency Ordered Stop   06/01/22 0000  nitrofurantoin, macrocrystal-monohydrate, (MACROBID) 100 MG capsule        100 mg Oral Every 12 hours 06/01/22 0936     05/31/22 1030  nitrofurantoin (macrocrystal-monohydrate) (MACROBID) capsule 100 mg  Status:  Discontinued        100 mg Oral Every 12 hours 05/31/22 0936 06/03/22 0717   05/30/22 1000  cefTRIAXone (ROCEPHIN) 1 g in sodium chloride 0.9 %  100 mL IVPB  Status:  Discontinued        1 g 200 mL/hr over 30 Minutes Intravenous Every 24 hours 05/30/22 0949 05/31/22 0936   05/29/22 1015  cefTRIAXone (ROCEPHIN) 1 g in sodium chloride 0.9 % 100 mL IVPB        1 g 200 mL/hr over 30 Minutes Intravenous  Once 05/29/22 1010 05/29/22 1324         Subjective:  Patient seen and examined.  She has no complaints.  [name]  requesting to discharge.  Objective: Vitals:   06/12/22 0600 06/12/22 0757 06/12/22 0926 06/12/22 1237  BP:   127/75 (!) 116/57  Pulse: 91  87 77  Resp: 17  18 15   Temp:  98.3 F (36.8 C)    TempSrc:  Oral    SpO2: 96%  99% 99%  Weight:      Height:        Intake/Output Summary (Last 24 hours) at 06/12/2022 1359 Last data filed at 06/11/2022 1900 Gross per 24 hour  Intake --  Output 1000 ml  Net -1000 ml    Filed Weights   06/10/22 0417 06/11/22 0500 06/12/22 0500  Weight: 58.2 kg 59.7 kg 59.9 kg    Examination:  General exam: Appears calm and comfortable  Respiratory system: Clear to auscultation. Respiratory effort normal. Cardiovascular system: S1 & S2 heard, RRR. No JVD, murmurs, rubs, gallops or clicks. No pedal edema. Gastrointestinal system: Abdomen is nondistended, soft and nontender. No organomegaly or masses felt. Normal bowel sounds heard. Central nervous system: Alert and oriented. No focal neurological deficits. Extremities: Symmetric 5 x 5 power. Skin: No rashes, lesions or ulcers.  Psychiatry: Judgement and insight appear normal. Mood & affect appropriate.   Data Reviewed: I have personally reviewed following labs and imaging studies  CBC: Recent Labs  Lab 06/06/22 0406 06/07/22 0416 06/08/22 0516  WBC 10.4 11.9* 12.7*  HGB 8.8* 9.4* 9.7*  HCT 24.2* 25.0* 27.9*  MCV 86.1 84.7 86.9  PLT 203 236 [id number]    Basic Metabolic Panel: Recent Labs  Lab 06/08/22 0516 06/09/22 0422 06/09/22 1109 06/09/22 1738 06/10/22 0407 06/11/22 0414 06/12/22 0409  NA 133*  --  134* 133* 126* 129* 131*  K 3.6  --  4.9 4.4 3.6 4.7 4.5  CL 86*  --  90* 89* 88* 92* 93*  CO2 31  --  29 25 27 25 25   GLUCOSE 82  --  178* 140* 387* 168* 124*  BUN 41*  --  53* 56* 53* 52* 69*  CREATININE 2.05*  --  2.63* 3.22* 2.64* 2.59* 3.13*  CALCIUM 8.7*  --  9.2 9.2 8.7* 9.4 9.4  MG 2.3 2.2  --  2.0  --  1.9 2.6*    GFR: Estimated Creatinine Clearance: 19.4 mL/min (A) (by C-G  formula based on SCr of 3.13 mg/dL (H)). Liver Function Tests: Recent Labs  Lab 06/06/22 0406 06/06/22 [id number] 06/07/22 0416 06/08/22 0516 06/11/22 0414  AST 149* 132* 116* 72* 52*  ALT 96* 88* 77* 66* 41  ALKPHOS 233* 230* 221* 222* 141*  BILITOT 2.1* 2.2* 2.9* 1.7* 1.1  PROT 6.4* 6.4* 7.0 7.2 7.6  ALBUMIN 2.1* 2.1* 2.3* 2.5* 2.9*    No results for input(s): "LIPASE", "AMYLASE" in the last 168 hours.  No results for input(s): "AMMONIA" in the last 168 hours. Coagulation Profile: No results for input(s): "INR", "PROTIME" in the last 168 hours. Cardiac Enzymes: No results for input(s): "CKTOTAL", "CKMB", "CKMBINDEX", "  TROPONINI" in the last 168 hours. BNP (last 3 results) No results for input(s): "PROBNP" in the last 8760 hours. HbA1C: No results for input(s): "HGBA1C" in the last 72 hours.  CBG: Recent Labs  Lab 06/12/22 0415 06/12/22 0635 06/12/22 1119 06/12/22 1142 06/12/22 1236  GLUCAP 121* 136* 47* 63* 139*    Lipid Profile: Recent Labs    06/11/22 1629 06/12/22 0409  TRIG 1,635* 1,909*    Thyroid Function Tests: No results for input(s): "TSH", "T4TOTAL", "FREET4", "T3FREE", "THYROIDAB" in the last 72 hours. Anemia Panel: No results for input(s): "VITAMINB12", "FOLATE", "FERRITIN", "TIBC", "IRON", "RETICCTPCT" in the last 72 hours. Sepsis Labs: No results for input(s): "PROCALCITON", "LATICACIDVEN" in the last 168 hours.   No results found for this or any previous visit (from the past 240 hour(s)).    Radiology Studies: No results found.  Scheduled Meds:  aspirin EC  81 mg Oral Daily   Chlorhexidine Gluconate Cloth  6 each Topical Daily   DULoxetine  30 mg Oral QHS   fenofibrate  160 mg Oral Daily   heparin injection (subcutaneous)  5,000 Units Subcutaneous Q8H   hydrALAZINE  50 mg Oral Q8H   insulin aspart  0-20 Units Subcutaneous TID WC   insulin aspart  0-5 Units Subcutaneous QHS   insulin regular human CONCENTRATED  150 Units Subcutaneous  TID WC   ivabradine  7.5 mg Oral BID WC   levothyroxine  125 mcg Oral Q0600   lipase/protease/amylase  36,000 Units Oral TID   loratadine  10 mg Oral Daily   pantoprazole  40 mg Oral Daily   pregabalin  300 mg Oral BID   rosuvastatin  40 mg Oral Daily   sildenafil  20 mg Oral TID   sodium bicarbonate  650 mg Oral BID   sodium chloride flush  10-40 mL Intracatheter Q12H   spironolactone  25 mg Oral Daily   Continuous Infusions:     LOS: 14 days   Darliss Cheney, MD [facility]  06/12/2022, 1:59 PM   *Please note that this is a verbal dictation therefore any spelling or grammatical errors are due to the "[place]" system interpretation.  Please page via [place] and do not message via secure chat for urgent patient care matters. Secure chat can be used for non urgent patient care matters.  How to contact the [facility] Attending or Consulting provider [place] or covering provider during after hours [place], for this patient?  Check the care team in [facility] and look for a) attending/consulting [facility] provider listed and b) the [facility] team listed. Page or secure chat [phone]. Log into [email or web address] and use [place]'s universal password to access. If you do not have the password, please contact the hospital operator. Locate the [facility] provider you are looking for under [facility] and page to a number that you can be directly reached. If you still have difficulty reaching the provider, please page the [facility] (Director on Call) for the Hospitalists listed on amion for assistance.

## 2022-06-12 NOTE — Progress Notes (Signed)
Potassium on afternoon labs was elevated but hemolysis was noted by lab. RN redrew lab for accuracy and the second specimen was noted to be hemolyzed as well. A third specimen was sent down for accuracy and is pending. RN ran a VBG to evaluate potassium and it returned at 5.0. MD was made aware and it was recommended that the patient stay overnight. Pt was adamant that she is going home and stated to RN "I will rip this IV out of my arm and go home." RN requested MD speak with patient. Dr. Doristine Bosworth spoke with the patient via phone with RN present in the room and verbalized that it was strongly recommended that she stay overnight for additional treatment and monitoring given her potassium level is trending upward and that she is risking her life leaving the hospital. The patient stated that she understood and wants to leave despite the risk.

## 2022-06-12 NOTE — Progress Notes (Signed)
Patient was provided discharge instructions and education. Patient packed belongings and was transported off unit in wheelchair by Santiago Glad, [place] at [phone].

## 2022-06-12 NOTE — Progress Notes (Signed)
Hypoglycemic Event  CBG: 47    Treatment: juice/crackers followed by lunch   Symptoms: shaky, dizziness   Follow-up CBG: Time:1142 CBG Result: 63  Possible Reasons for Event: Medication regimen:    Treatment: juice/crackers  Symptoms: shaky, dizziness   Follow-up CBG: Time: 1236 CBG Result: 139  Comments/MD notified:Protocol followed. Paged diabetes coordinator on call to discuss medication regimen, awaiting return call.     Sherlyn Lees

## 2022-06-12 NOTE — Progress Notes (Signed)
PICC line removed per order.  Site WNL without signs of infection.  Cleaned with CHG, covered with vaseline gauze and dry 2x2, covered with tegaderm  occlusive dressing.  Pt verbalizes interventions for infection, signs of infection and interventions for bleeding and when to call MD via teach back method.  Verbalizes understanding to remain in the bed x 30 minutes.  RN aware of removal.

## 2022-06-13 DIAGNOSIS — E139 Other specified diabetes mellitus without complications: Secondary | ICD-10-CM | POA: Diagnosis not present

## 2022-06-15 ENCOUNTER — Observation Stay (HOSPITAL_COMMUNITY)
Admission: EM | Admit: 2022-06-15 | Discharge: 2022-06-16 | Disposition: A | Discharge status: Home / Self Care | Payer: Medicaid Other | Attending: Emergency Medicine | Admitting: Emergency Medicine

## 2022-06-15 ENCOUNTER — Other Ambulatory Visit: Payer: Self-pay

## 2022-06-15 ENCOUNTER — Emergency Department (HOSPITAL_COMMUNITY): Payer: Medicaid Other

## 2022-06-15 ENCOUNTER — Other Ambulatory Visit (HOSPITAL_COMMUNITY): Payer: Self-pay

## 2022-06-15 ENCOUNTER — Encounter (HOSPITAL_COMMUNITY): Payer: Self-pay

## 2022-06-15 DIAGNOSIS — Z7982 Long term (current) use of aspirin: Secondary | ICD-10-CM | POA: Insufficient documentation

## 2022-06-15 DIAGNOSIS — I130 Hypertensive heart and chronic kidney disease with heart failure and stage 1 through stage 4 chronic kidney disease, or unspecified chronic kidney disease: Secondary | ICD-10-CM | POA: Insufficient documentation

## 2022-06-15 DIAGNOSIS — R1084 Generalized abdominal pain: Secondary | ICD-10-CM | POA: Diagnosis not present

## 2022-06-15 DIAGNOSIS — R42 Dizziness and giddiness: Secondary | ICD-10-CM | POA: Diagnosis not present

## 2022-06-15 DIAGNOSIS — R109 Unspecified abdominal pain: Principal | ICD-10-CM | POA: Diagnosis present

## 2022-06-15 DIAGNOSIS — I5041 Acute combined systolic (congestive) and diastolic (congestive) heart failure: Secondary | ICD-10-CM | POA: Diagnosis not present

## 2022-06-15 DIAGNOSIS — R0602 Shortness of breath: Secondary | ICD-10-CM | POA: Diagnosis not present

## 2022-06-15 DIAGNOSIS — E039 Hypothyroidism, unspecified: Secondary | ICD-10-CM | POA: Insufficient documentation

## 2022-06-15 DIAGNOSIS — Z85841 Personal history of malignant neoplasm of brain: Secondary | ICD-10-CM | POA: Insufficient documentation

## 2022-06-15 DIAGNOSIS — R110 Nausea: Secondary | ICD-10-CM | POA: Diagnosis not present

## 2022-06-15 DIAGNOSIS — I10 Essential (primary) hypertension: Secondary | ICD-10-CM | POA: Diagnosis present

## 2022-06-15 DIAGNOSIS — Z79899 Other long term (current) drug therapy: Secondary | ICD-10-CM | POA: Insufficient documentation

## 2022-06-15 DIAGNOSIS — Z794 Long term (current) use of insulin: Secondary | ICD-10-CM | POA: Diagnosis not present

## 2022-06-15 DIAGNOSIS — N1831 Chronic kidney disease, stage 3a: Secondary | ICD-10-CM | POA: Diagnosis not present

## 2022-06-15 DIAGNOSIS — R112 Nausea with vomiting, unspecified: Secondary | ICD-10-CM | POA: Insufficient documentation

## 2022-06-15 DIAGNOSIS — E1122 Type 2 diabetes mellitus with diabetic chronic kidney disease: Secondary | ICD-10-CM | POA: Diagnosis not present

## 2022-06-15 DIAGNOSIS — I509 Heart failure, unspecified: Secondary | ICD-10-CM | POA: Diagnosis not present

## 2022-06-15 DIAGNOSIS — B9620 Unspecified Escherichia coli [E. coli] as the cause of diseases classified elsewhere: Secondary | ICD-10-CM | POA: Diagnosis not present

## 2022-06-15 DIAGNOSIS — R531 Weakness: Secondary | ICD-10-CM | POA: Diagnosis not present

## 2022-06-15 DIAGNOSIS — R000 Tachycardia, unspecified: Secondary | ICD-10-CM | POA: Diagnosis not present

## 2022-06-15 DIAGNOSIS — R1010 Upper abdominal pain, unspecified: Secondary | ICD-10-CM | POA: Diagnosis not present

## 2022-06-15 DIAGNOSIS — E781 Pure hyperglyceridemia: Secondary | ICD-10-CM | POA: Diagnosis present

## 2022-06-15 LAB — COMPREHENSIVE METABOLIC PANEL
ALT: 40 U/L (ref 0–44)
AST: 44 U/L — ABNORMAL HIGH (ref 15–41)
Albumin: 4.1 g/dL (ref 3.5–5.0)
Alkaline Phosphatase: 114 U/L (ref 38–126)
Anion gap: 17 — ABNORMAL HIGH (ref 5–15)
BUN: 83 mg/dL — ABNORMAL HIGH (ref 6–20)
CO2: 20 mmol/L — ABNORMAL LOW (ref 22–32)
Calcium: 10.9 mg/dL — ABNORMAL HIGH (ref 8.9–10.3)
Chloride: 97 mmol/L — ABNORMAL LOW (ref 98–111)
Creatinine, Ser: 2.62 mg/dL — ABNORMAL HIGH (ref 0.44–1.00)
GFR, Estimated: 24 mL/min — ABNORMAL LOW (ref >60)
Glucose, Bld: 113 mg/dL — ABNORMAL HIGH (ref 70–99)
Potassium: 4.3 mmol/L (ref 3.5–5.1)
Sodium: 134 mmol/L — ABNORMAL LOW (ref 135–145)
Total Bilirubin: 1 mg/dL (ref 0.3–1.2)
Total Protein: 8.9 g/dL — ABNORMAL HIGH (ref 6.5–8.1)

## 2022-06-15 LAB — CBC WITH DIFFERENTIAL/PLATELET
Abs Immature Granulocytes: 0.08 10*3/uL — ABNORMAL HIGH (ref 0.00–0.07)
Basophils Absolute: 0.1 10*3/uL (ref 0.0–0.1)
Basophils Relative: 1 %
Eosinophils Absolute: 0.9 10*3/uL — ABNORMAL HIGH (ref 0.0–0.5)
Eosinophils Relative: 9 %
HCT: 34.8 % — ABNORMAL LOW (ref 36.0–46.0)
Hemoglobin: 11 g/dL — ABNORMAL LOW (ref 12.0–15.0)
Immature Granulocytes: 1 %
Lymphocytes Relative: 19 %
Lymphs Abs: 1.9 10*3/uL (ref 0.7–4.0)
MCH: 27.2 pg (ref 26.0–34.0)
MCHC: 31.6 g/dL (ref 30.0–36.0)
MCV: 86.1 fL (ref 80.0–100.0)
Monocytes Absolute: 0.6 10*3/uL (ref 0.1–1.0)
Monocytes Relative: 6 %
Neutro Abs: 6.6 10*3/uL (ref 1.7–7.7)
Neutrophils Relative %: 64 %
Platelets: 346 10*3/uL (ref 150–400)
RBC: 4.04 MIL/uL (ref 3.87–5.11)
RDW: 16.7 % — ABNORMAL HIGH (ref 11.5–15.5)
WBC: 10.2 10*3/uL (ref 4.0–10.5)
nRBC: 0.2 % (ref 0.0–0.2)

## 2022-06-15 LAB — URINALYSIS, ROUTINE W REFLEX MICROSCOPIC
Bilirubin Urine: NEGATIVE
Glucose, UA: NEGATIVE mg/dL
Hgb urine dipstick: NEGATIVE
Ketones, ur: NEGATIVE mg/dL
Nitrite: NEGATIVE
Protein, ur: 100 mg/dL — AB
Specific Gravity, Urine: 1.015 (ref 1.005–1.030)
pH: 5 (ref 5.0–8.0)

## 2022-06-15 LAB — I-STAT VENOUS BLOOD GAS, ED
Acid-base deficit: 3 mmol/L — ABNORMAL HIGH (ref 0.0–2.0)
Bicarbonate: 24.3 mmol/L (ref 20.0–28.0)
Calcium, Ion: 1.43 mmol/L — ABNORMAL HIGH (ref 1.15–1.40)
HCT: 36 % (ref 36.0–46.0)
Hemoglobin: 12.2 g/dL (ref 12.0–15.0)
O2 Saturation: 54 %
Potassium: 4.5 mmol/L (ref 3.5–5.1)
Sodium: 136 mmol/L (ref 135–145)
TCO2: 26 mmol/L (ref 22–32)
pCO2, Ven: 51.7 mmHg (ref 44–60)
pH, Ven: 7.28 (ref 7.25–7.43)
pO2, Ven: 33 mmHg (ref 32–45)

## 2022-06-15 LAB — GLUCOSE, CAPILLARY
Glucose-Capillary: 90 mg/dL (ref 70–99)
Glucose-Capillary: 223 mg/dL — ABNORMAL HIGH (ref 70–99)

## 2022-06-15 LAB — LACTIC ACID, PLASMA
Lactic Acid, Venous: 2.2 mmol/L (ref 0.5–1.9)
Lactic Acid, Venous: 1.5 mmol/L (ref 0.5–1.9)

## 2022-06-15 LAB — I-STAT BETA HCG BLOOD, ED (MC, WL, AP ONLY): I-stat hCG, quantitative: <5 m[IU]/mL (ref <5)

## 2022-06-15 LAB — TROPONIN I (HIGH SENSITIVITY)
Troponin I (High Sensitivity): 17 ng/L (ref <18)
Troponin I (High Sensitivity): 15 ng/L (ref <18)

## 2022-06-15 LAB — BETA-HYDROXYBUTYRIC ACID: Beta-Hydroxybutyric Acid: 0.07 mmol/L (ref 0.05–0.27)

## 2022-06-15 LAB — CBG MONITORING, ED: Glucose-Capillary: 106 mg/dL — ABNORMAL HIGH (ref 70–99)

## 2022-06-15 LAB — LIPASE, BLOOD: Lipase: 47 U/L (ref 11–51)

## 2022-06-15 LAB — BRAIN NATRIURETIC PEPTIDE: B Natriuretic Peptide: 80.9 pg/mL (ref 0.0–100.0)

## 2022-06-15 LAB — TRIGLYCERIDES: Triglycerides: 1935 mg/dL — ABNORMAL HIGH (ref <150)

## 2022-06-15 MED ORDER — LORATADINE 10 MG PO TABS
10.0000 mg | ORAL_TABLET | Freq: Every day | ORAL | Status: DC
  Administered 2022-06-15 – 2022-06-16 (×2): 10 mg via ORAL
  Filled 2022-06-15 (×2): qty 1

## 2022-06-15 MED ORDER — NITROFURANTOIN MONOHYD MACRO 100 MG PO CAPS: 100.0000 mg | ORAL_CAPSULE | Freq: Two times a day (BID) | ORAL | Status: DC

## 2022-06-15 MED ORDER — LEVOTHYROXINE SODIUM 75 MCG PO TABS
125.0000 ug | ORAL_TABLET | Freq: Every day | ORAL | Status: DC
  Administered 2022-06-16: 125 ug via ORAL
  Filled 2022-06-15: qty 1

## 2022-06-15 MED ORDER — ONDANSETRON HCL 4 MG/2ML IJ SOLN
4.0000 mg | Freq: Once | INTRAMUSCULAR | Status: AC
  Administered 2022-06-15: 4 mg via INTRAVENOUS
  Filled 2022-06-15: qty 2

## 2022-06-15 MED ORDER — METOCLOPRAMIDE HCL 10 MG PO TABS
10.0000 mg | ORAL_TABLET | Freq: Three times a day (TID) | ORAL | Status: DC
  Administered 2022-06-15 – 2022-06-16 (×2): 10 mg via ORAL
  Filled 2022-06-15 (×5): qty 1

## 2022-06-15 MED ORDER — SPIRONOLACTONE 25 MG PO TABS
25.0000 mg | ORAL_TABLET | Freq: Every day | ORAL | Status: DC
  Administered 2022-06-16: 25 mg via ORAL
  Filled 2022-06-15: qty 1

## 2022-06-15 MED ORDER — HYDROMORPHONE HCL 1 MG/ML IJ SOLN
0.5000 mg | INTRAMUSCULAR | Status: DC | PRN
  Administered 2022-06-15 – 2022-06-16 (×6): 0.5 mg via INTRAVENOUS
  Filled 2022-06-15 (×6): qty 0.5

## 2022-06-15 MED ORDER — INSULIN ASPART 100 UNIT/ML IJ SOLN
0.0000 [IU] | Freq: Three times a day (TID) | INTRAMUSCULAR | Status: DC
  Administered 2022-06-16: 4 [IU] via SUBCUTANEOUS

## 2022-06-15 MED ORDER — PANTOPRAZOLE SODIUM 40 MG PO TBEC
40.0000 mg | DELAYED_RELEASE_TABLET | Freq: Every day | ORAL | Status: DC
  Administered 2022-06-15 – 2022-06-16 (×2): 40 mg via ORAL
  Filled 2022-06-15 (×2): qty 1

## 2022-06-15 MED ORDER — ALBUTEROL SULFATE (2.5 MG/3ML) 0.083% IN NEBU: 3.0000 mL | INHALATION_SOLUTION | Freq: Four times a day (QID) | RESPIRATORY_TRACT | Status: DC | PRN

## 2022-06-15 MED ORDER — FENTANYL CITRATE PF 50 MCG/ML IJ SOSY
50.0000 ug | PREFILLED_SYRINGE | Freq: Once | INTRAMUSCULAR | Status: AC
  Administered 2022-06-15: 50 ug via INTRAVENOUS
  Filled 2022-06-15: qty 1

## 2022-06-15 MED ORDER — FENOFIBRATE 160 MG PO TABS: 160.0000 mg | ORAL_TABLET | Freq: Every day | ORAL | Status: DC

## 2022-06-15 MED ORDER — ONDANSETRON HCL 4 MG PO TABS
4.0000 mg | ORAL_TABLET | Freq: Four times a day (QID) | ORAL | Status: DC | PRN
  Administered 2022-06-16: 4 mg via ORAL
  Filled 2022-06-15: qty 1

## 2022-06-15 MED ORDER — ENOXAPARIN SODIUM 30 MG/0.3ML IJ SOSY
30.0000 mg | PREFILLED_SYRINGE | INTRAMUSCULAR | Status: DC
  Administered 2022-06-15: 30 mg via SUBCUTANEOUS
  Filled 2022-06-15: qty 0.3

## 2022-06-15 MED ORDER — FENTANYL CITRATE PF 50 MCG/ML IJ SOSY
25.0000 ug | PREFILLED_SYRINGE | Freq: Once | INTRAMUSCULAR | Status: AC
  Administered 2022-06-15: 25 ug via INTRAVENOUS
  Filled 2022-06-15: qty 1

## 2022-06-15 MED ORDER — PANCRELIPASE (LIP-PROT-AMYL) 36000-114000 UNITS PO CPEP
36000.0000 [IU] | ORAL_CAPSULE | Freq: Three times a day (TID) | ORAL | Status: DC
  Administered 2022-06-15 – 2022-06-16 (×2): 36000 [IU] via ORAL
  Filled 2022-06-15 (×2): qty 1

## 2022-06-15 MED ORDER — INSULIN REGULAR HUMAN (CONC) 500 UNIT/ML ~~LOC~~ SOPN: 120.0000 [IU] | PEN_INJECTOR | Freq: Three times a day (TID) | SUBCUTANEOUS | Status: DC

## 2022-06-15 MED ORDER — SILDENAFIL CITRATE 20 MG PO TABS
20.0000 mg | ORAL_TABLET | Freq: Three times a day (TID) | ORAL | Status: DC
  Administered 2022-06-15 – 2022-06-16 (×2): 20 mg via ORAL
  Filled 2022-06-15 (×7): qty 1

## 2022-06-15 MED ORDER — ROSUVASTATIN CALCIUM 20 MG PO TABS
40.0000 mg | ORAL_TABLET | Freq: Every day | ORAL | Status: DC
  Administered 2022-06-16: 40 mg via ORAL
  Filled 2022-06-15: qty 2

## 2022-06-15 MED ORDER — LACTATED RINGERS IV BOLUS
500.0000 mL | Freq: Once | INTRAVENOUS | Status: AC
  Administered 2022-06-15: 500 mL via INTRAVENOUS

## 2022-06-15 MED ORDER — NORETHIN ACE-ETH ESTRAD-FE 1-20 MG-MCG PO TABS: 1.0000 | ORAL_TABLET | Freq: Every day | ORAL | Status: DC

## 2022-06-15 MED ORDER — ASPIRIN 81 MG PO TBEC
81.0000 mg | DELAYED_RELEASE_TABLET | Freq: Every day | ORAL | Status: DC
  Administered 2022-06-15 – 2022-06-16 (×2): 81 mg via ORAL
  Filled 2022-06-15 (×2): qty 1

## 2022-06-15 MED ORDER — IVABRADINE HCL 7.5 MG PO TABS
7.5000 mg | ORAL_TABLET | Freq: Two times a day (BID) | ORAL | Status: DC
  Administered 2022-06-15 – 2022-06-16 (×2): 7.5 mg via ORAL
  Filled 2022-06-15 (×4): qty 1

## 2022-06-15 MED ORDER — DULOXETINE HCL 30 MG PO CPEP
30.0000 mg | ORAL_CAPSULE | Freq: Every day | ORAL | Status: DC
  Administered 2022-06-15: 30 mg via ORAL
  Filled 2022-06-15: qty 1

## 2022-06-15 MED ORDER — SODIUM CHLORIDE 0.9 % IV SOLN: INTRAVENOUS | Status: AC

## 2022-06-15 MED ORDER — SODIUM BICARBONATE 650 MG PO TABS
650.0000 mg | ORAL_TABLET | Freq: Two times a day (BID) | ORAL | Status: DC
  Administered 2022-06-15 – 2022-06-16 (×2): 650 mg via ORAL
  Filled 2022-06-15 (×3): qty 1

## 2022-06-15 MED ORDER — INSULIN GLARGINE-YFGN 100 UNIT/ML ~~LOC~~ SOLN
60.0000 [IU] | Freq: Two times a day (BID) | SUBCUTANEOUS | Status: DC
  Administered 2022-06-15 – 2022-06-16 (×2): 60 [IU] via SUBCUTANEOUS
  Filled 2022-06-15 (×4): qty 0.6

## 2022-06-15 MED ORDER — INSULIN ASPART 100 UNIT/ML IJ SOLN
0.0000 [IU] | Freq: Every day | INTRAMUSCULAR | Status: DC
  Administered 2022-06-15: 2 [IU] via SUBCUTANEOUS

## 2022-06-15 MED ORDER — HYDRALAZINE HCL 50 MG PO TABS
50.0000 mg | ORAL_TABLET | Freq: Three times a day (TID) | ORAL | Status: DC
  Administered 2022-06-15 – 2022-06-16 (×3): 50 mg via ORAL
  Filled 2022-06-15 (×4): qty 1

## 2022-06-15 MED ORDER — PROCHLORPERAZINE MALEATE 5 MG PO TABS
5.0000 mg | ORAL_TABLET | Freq: Every day | ORAL | Status: DC
  Administered 2022-06-16: 5 mg via ORAL
  Filled 2022-06-15: qty 1

## 2022-06-15 MED ORDER — GUAIFENESIN-DM 100-10 MG/5ML PO SYRP: 10.0000 mL | ORAL_SOLUTION | Freq: Four times a day (QID) | ORAL | Status: DC | PRN

## 2022-06-15 MED ORDER — SODIUM CHLORIDE 0.9 % IV SOLN: INTRAVENOUS | Status: DC

## 2022-06-15 MED ORDER — ONDANSETRON HCL 4 MG/2ML IJ SOLN
4.0000 mg | Freq: Four times a day (QID) | INTRAMUSCULAR | Status: DC | PRN
  Administered 2022-06-15 – 2022-06-16 (×2): 4 mg via INTRAVENOUS
  Filled 2022-06-15 (×2): qty 2

## 2022-06-15 NOTE — ED Notes (Addendum)
CBG 106 

## 2022-06-15 NOTE — H&P (Signed)
History and Physical    Jennifer [name] [id number] DOB: [birth year] DOA: 06/15/2022  PCP: Sue Lush, PA-C (Confirm with patient/family/NH records and if not entered, this has to be entered at [facility] point of entry) Patient coming from: Home  I have personally briefly reviewed patient's old medical records in [place]  Chief Complaint: Nauseous vomiting abdominal pain.  HPI: Jennifer [name] is a 32 y.o. female with medical history significant of hypertriglyceridemia with repeated acute pancreatitis, IDDM with insulin resistance, diabetic neuropathy, gastroparesis 2022, small intestine bacterial overgrowth (SIBO) 2023, chronic HFrEF with LVEF 45-50% 2023, PCOS, hypothyroidism, CKD stage IIIa, presented with worsening of nauseous vomiting abdominal pain and generalized weakness.  Patient was recently hospitalized for acute decompensation of HFrEF with fluid overload as well as uncontrolled hypertriglyceridemia with TG> 5000.  Patient was treated with insulin drip diuresis and her symptoms stabilized but patient left the hospital before the treatment was completed.  Last 2 days increasing patient has experienced nauseous and occasional vomiting of stomach content with cramping-like abdominal pain which she fears " pancreatitis comes back", abdominal pain has been constant nonradiating denies any chest pain shortness of breath no diarrhea.  Patient reported that recently her Repatha was declined by [facility] and she only takes p.o. medication for her hypertriglyceridemia.  ED Course: Borderline tachycardia afebrile, blood pressure 120/75 nonhypoxic.  Blood work showed creatinine 2.6 compared to baseline 2.6-3.1 AST ALT within normal limits total bilirubin 1.0.  Lipase 47.  Triglyceride 1900 appears to be at baseline.  Patient was given multiple rounds of Zofran, IV pain meds, symptoms persisted.  Review of Systems: As per HPI otherwise 14 point review of systems negative.     Past Medical History:  Diagnosis Date   Brain tumor ([place]) 03/29/1995   astrocytoma   CHF (congestive heart failure) (HCC)    Cholesterosis    DM (diabetes mellitus) ([place]) 10/10/2018   Fatty liver    HTN (hypertension) 10/10/2018   Hypothyroidism 10/10/2018   Lipoprotein deficiency    Lung disease    longevity long term   Pancreatitis    Polycystic ovary syndrome     Past Surgical History:  Procedure Laterality Date   ABDOMINAL SURGERY     pt states during miscarriage got her intestine   BRAIN SURGERY     EYE MUSCLE SURGERY Right 03/28/2014   PRESSURE SENSOR/CARDIOMEMS N/A 12/06/2021   Procedure: PRESSURE SENSOR/CARDIOMEMS;  Surgeon: [name], MD;  Location: [place] [facility];  Service: Cardiovascular;  Laterality: N/A;   RIGHT HEART CATH N/A 08/05/2021   Procedure: RIGHT HEART CATH;  Surgeon: [name], MD;  Location: [place] [facility];  Service: Cardiovascular;  Laterality: N/A;   RIGHT HEART CATH N/A 12/06/2021   Procedure: RIGHT HEART CATH;  Surgeon: [name], MD;  Location: [place] [facility];  Service: Cardiovascular;  Laterality: N/A;   RIGHT/LEFT HEART CATH AND CORONARY ANGIOGRAPHY N/A 12/03/2020   Procedure: RIGHT/LEFT HEART CATH AND CORONARY ANGIOGRAPHY;  Surgeon: [name], MD;  Location: [place] [facility];  Service: Cardiovascular;  Laterality: N/A;   VENTRICULOSTOMY  03/28/1997     reports that she has never smoked. She has never used smokeless tobacco. She reports that she does not drink alcohol and does not use drugs.  Allergies  Allergen Reactions   Ketamine Other (See Comments)    In vegetative state for 15 minutes per pt   Erythromycin     unk   Fentanyl Nausea And Vomiting  M[place] Mushroom [[place]] Itching    Itchy throat   Penicillin V Itching    Gi upset   Shellfish Allergy Other (See Comments)    Pt has never had shellfish but tested positive on allergy test. Pt states contrast in CT is okay   Morphine  Itching and Rash   Penicillins Rash    Has patient had a PCN reaction causing immediate rash, facial/tongue/throat swelling, SOB or lightheadedness with hypotension: Y Has patient had a PCN reaction causing severe rash involving mucus membranes or skin necrosis: Y Has patient had a PCN reaction that required hospitalization: N Has patient had a PCN reaction occurring within the last 10 years: Y If all of the above answers are "NO", then may proceed with Cephalosporin use.    Prednisone Rash    Family History  Problem Relation Age of Onset   Diabetes Mother    Hypertension Mother    Hyperlipidemia Mother    Thyroid disease Mother    Hypertension Father    Diabetes Father    Pancreatic cancer Paternal Aunt    Pancreatic cancer Paternal Uncle    Colon cancer Neg Hx    Esophageal cancer Neg Hx    Stomach cancer Neg Hx      Prior to Admission medications   Medication Sig Start Date End Date Taking? Authorizing Provider  albuterol (VENTOLIN HFA) 108 (90 Base) MCG/ACT inhaler Inhale 1-2 puffs into the lungs every 6 (six) hours as needed for wheezing or shortness of breath. 12/31/18   [provider]  aspirin EC 81 MG EC tablet Take 1 tablet (81 mg total) by mouth daily. Swallow whole. 12/07/20   Nicole Kindred A, DO  CREON [id number] units CPEP capsule Take 36,000 Units by mouth 3 (three) times daily.    [provider]  DULoxetine (CYMBALTA) 30 MG capsule Take 30 mg by mouth at bedtime. 01/28/19 01/27/23  [provider]  ergocalciferol (VITAMIN D2) 1.25 MG (50000 UT) capsule Take 50,000 Units by mouth every 7 (seven) days.    [provider]  Evolocumab (REPATHA) 140 MG/ML SOSY Inject 140 mg into the skin See admin instructions. Inject 140 mg subcutaneously every other Monday evening    [provider]  fenofibrate 160 MG tablet Take 1 tablet (160 mg total) by mouth daily. 12/07/20   Ezekiel Slocumb, DO  guaiFENesin-dextromethorphan  (ROBITUSSIN DM) 100-10 MG/5ML syrup Take 10 mLs by mouth every 6 (six) hours as needed for cough. 03/25/22   Regalado, Belkys A, MD  hydrALAZINE (APRESOLINE) 50 MG tablet Take 1 tablet (50 mg total) by mouth every 8 (eight) hours. 06/12/22 07/12/22  Darliss Cheney, MD  Insulin Pen Needle 32G X 4 MM MISC 1 Device by Does not apply route QID. For use with insulin pens 02/13/22   Donne Hazel, MD  insulin regular human CONCENTRATED (HUMULIN R U-500 KWIKPEN) 500 UNIT/ML KwikPen Inject 120 Units into the skin 3 (three) times daily with meals. 06/01/22   Erick Colace, NP  ivabradine (CORLANOR) 7.5 MG TABS tablet Take 1 tablet (7.5 mg total) by mouth 2 (two) times daily with a meal. 08/02/21   [name], Shaune Pascal, MD  levocetirizine (XYZAL) 5 MG tablet Take 5 mg by mouth daily. 03/05/22   [provider]  levothyroxine (SYNTHROID) 125 MCG tablet Take 1 tablet (125 mcg total) by mouth daily at 6 (six) AM. 12/07/20   Ezekiel Slocumb, DO  nitrofurantoin, macrocrystal-monohydrate, (MACROBID) 100 MG capsule Take 1 capsule (100  mg total) by mouth every 12 (twelve) hours. 06/01/22   Erick Colace, NP  norethindrone-ethinyl estradiol-FE ([name] 1/20) 1-20 MG-MCG tablet Take 1 tablet by mouth daily.    [provider]  pantoprazole (PROTONIX) 40 MG tablet Take 40 mg by mouth daily. 12/24/19   [provider]  pregabalin (LYRICA) 150 MG capsule Take 300 mg by mouth at bedtime.    [provider]  prochlorperazine (COMPAZINE) 5 MG tablet Take 5 mg by mouth daily.    [provider]  rosuvastatin (CRESTOR) 40 MG tablet Take 40 mg by mouth daily. 08/09/21   [provider]  sildenafil (REVATIO) 20 MG tablet Take 1 tablet (20 mg total) by mouth 3 (three) times daily. 05/11/22   [name], Shaune Pascal, MD  sodium bicarbonate 650 MG tablet Take 650 mg by mouth 2 (two) times daily.    [provider]  spironolactone (ALDACTONE) 25 MG tablet Take 1 tablet (25 mg  total) by mouth daily. 06/13/22 07/13/22  Darliss Cheney, MD  tretinoin (RETIN-A) 0.05 % cream Apply 1 application topically at bedtime. 01/17/21   [provider]    Physical Exam: Vitals:   06/15/22 1330 06/15/22 1523 06/15/22 1530 06/15/22 1600  BP: 128/81  126/72 127/74  Pulse: (!) 105  96 96  Resp: 20     Temp:  98 F (36.7 C)    TempSrc:  Oral    SpO2: 100%  99% 99%  Weight:      Height:        Constitutional: NAD, calm, comfortable Vitals:   06/15/22 1330 06/15/22 1523 06/15/22 1530 06/15/22 1600  BP: 128/81  126/72 127/74  Pulse: (!) 105  96 96  Resp: 20     Temp:  98 F (36.7 C)    TempSrc:  Oral    SpO2: 100%  99% 99%  Weight:      Height:       Eyes: PERRL, lids and conjunctivae normal ENMT: Mucous membranes are dry. Posterior pharynx clear of any exudate or lesions.Normal dentition.  Neck: normal, supple, no masses, no thyromegaly Respiratory: clear to auscultation bilaterally, no wheezing, no crackles. Normal respiratory effort. No accessory muscle use.  Cardiovascular: Regular rate and rhythm, no murmurs / rubs / gallops. No extremity edema. 2+ pedal pulses. No carotid bruits.  Abdomen: mild tenderness on epigastric area, no rebound no guarding, no masses palpated. No hepatosplenomegaly. Bowel sounds positive.  Musculoskeletal: no clubbing / cyanosis. No joint deformity upper and lower extremities. Good ROM, no contractures. Normal muscle tone.  Skin: no rashes, lesions, ulcers. No induration Neurologic: CN 2-12 grossly intact. Sensation intact, DTR normal. Strength 5/5 in all 4.  Psychiatric: Normal judgment and insight. Alert and oriented x 3. Normal mood.     Labs on Admission: I have personally reviewed following labs and imaging studies  CBC: Recent Labs  Lab 06/12/22 1742 06/15/22 1105 06/15/22 1137  WBC  --  10.2  --   NEUTROABS  --  6.6  --   HGB 10.5* 11.0* 12.2  HCT 31.0* 34.8* 36.0  MCV  --  86.1  --   PLT  --  346  --    Basic  Metabolic Panel: Recent Labs  Lab 06/09/22 0422 06/09/22 1109 06/09/22 1738 06/10/22 0407 06/11/22 0414 06/12/22 0409 06/12/22 1459 06/12/22 1734 06/12/22 1742 06/15/22 1105 06/15/22 1137  NA  --    < > 133* 126* 129* 131* 130*  --  133* 134* 136  K  --    < > 4.4 3.6 4.7 4.5 5.8* 5.5* 5.0 4.3 4.5  CL  --    < > 89* 88* 92* 93* 93*  --   --  97*  --   CO2  --    < > 25 27 25 25 25   --   --  20*  --   GLUCOSE  --    < > 140* 387* 168* 124* 162*  --   --  113*  --   BUN  --    < > 56* 53* 52* 69* 73*  --   --  83*  --   CREATININE  --    < > 3.22* 2.64* 2.59* 3.13* 2.97*  --   --  2.62*  --   CALCIUM  --    < > 9.2 8.7* 9.4 9.4 9.4  --   --  10.9*  --   MG 2.2  --  2.0  --  1.9 2.6*  --   --   --   --   --    < > = values in this interval not displayed.   GFR: Estimated Creatinine Clearance: 22.9 mL/min (A) (by C-G formula based on SCr of 2.62 mg/dL (H)). Liver Function Tests: Recent Labs  Lab 06/11/22 0414 06/15/22 1105  AST 52* 44*  ALT 41 40  ALKPHOS 141* 114  BILITOT 1.1 1.0  PROT 7.6 8.9*  ALBUMIN 2.9* 4.1   Recent Labs  Lab 06/15/22 1105  LIPASE 47   No results for input(s): "AMMONIA" in the last 168 hours. Coagulation Profile: No results for input(s): "INR", "PROTIME" in the last 168 hours. Cardiac Enzymes: No results for input(s): "CKTOTAL", "CKMB", "CKMBINDEX", "TROPONINI" in the last 168 hours. BNP (last 3 results) No results for input(s): "PROBNP" in the last 8760 hours. HbA1C: No results for input(s): "HGBA1C" in the last 72 hours. CBG: Recent Labs  Lab 06/12/22 1119 06/12/22 1142 06/12/22 1236 06/12/22 1625 06/15/22 1143  GLUCAP 47* 63* 139* 130* 106*   Lipid Profile: Recent Labs    06/15/22 1140  TRIG 1,935*   Thyroid Function Tests: No results for input(s): "TSH", "T4TOTAL", "FREET4", "T3FREE", "THYROIDAB" in the last 72 hours. Anemia Panel: No results for input(s): "VITAMINB12", "FOLATE", "FERRITIN", "TIBC", "IRON", "RETICCTPCT" in  the last 72 hours. Urine analysis:    Component Value Date/Time   COLORURINE YELLOW 06/15/2022 1400   APPEARANCEUR CLOUDY (A) 06/15/2022 1400   LABSPEC 1.015 06/15/2022 1400   PHURINE 5.0 06/15/2022 1400   GLUCOSEU NEGATIVE 06/15/2022 1400   HGBUR NEGATIVE 06/15/2022 1400   BILIRUBINUR NEGATIVE 06/15/2022 1400   KETONESUR NEGATIVE 06/15/2022 1400   PROTEINUR 100 (A) 06/15/2022 1400   NITRITE NEGATIVE 06/15/2022 1400   LEUKOCYTESUR TRACE (A) 06/15/2022 1400    Radiological Exams on Admission: DG Chest 2 View  Result Date: 06/15/2022 CLINICAL DATA:  Shortness of breath, weakness, dizziness, CHF EXAM: CHEST - 2 VIEW COMPARISON:  06/03/2022 FINDINGS: Pressure monitoring device LEFT lower lobe pulmonary artery. Normal heart size, mediastinal contours, and pulmonary vascularity. Lungs clear. No pulmonary infiltrate, pleural effusion, or pneumothorax. Osseous structures unremarkable. IMPRESSION: No acute abnormalities. Electronically Signed   By: Lavonia Dana M.D.   On: 06/15/2022 12:00    EKG: Independently reviewed.  Sinus tachycardia, no acute ST changes.  Assessment/Plan Principal Problem:   Intractable abdominal pain Active Problems:   Hypertriglyceridemia   Essential hypertension   Acute combined systolic and diastolic heart failure ([place])  (  please populate well all problems here in Problem List. (For example, if patient is on BP meds at home and you resume or decide to hold them, it is a problem that needs to be her. Same for CAD, COPD, HLD and so on)  Intractable abdominal pain nauseous vomiting -Probably multiple factorial, clinically suspect recurrent pancreatitis, with history of gastroparesis also suspect recurrent gastroparesis. -Treatment wise, given that clinical course rather benign, plan to treat conservatively, IV fluids pain meds.  For suspected gastroparesis, will initiate trial of Reglan 3 times daily -IV fluid normal saline x 12 hours then reevaluate. -For recurrent  pancreatitis, there is no significant increase of her triglyceride level, will not initiate insulin drip at this time.  Continue fenofibrate.  Unfortunately, Repatha was declined by her insurance company. -Other DDx, she also has acute on chronic thinning of her CKD stage IIIa with worsening of uremia, may also contribute to her symptoms for which she will continue PPI.  Fatty liver appears to be stable.   Chronic HFrEF with pulmonary hypertension -Clinically appears to be dehydrated volume contracted, hold off diuresis -IVF for 12 hours then reevaluate -Continue spironolactone and hydralazine ivabradine and lisinopril  IDDM with insulin resistance -Sliding scale for now  Recent E. coli UTI -Continue nitrofurantoin for another 4 days  CKD stage III -Volume contracted, IVF, reevaluate tomorrow  History of hypertriglyceridemia -TRG level stable -Continue fenofibrate   DVT prophylaxis: Heparin subcu Code Status: Code Full Family Communication: None at bedside Disposition Plan: Expect less than 2 midnight hospital stay Consults called: None Admission status: Medsurg obs   Lequita Halt MD [facility] Pager [phone]  06/15/2022, 4:27 PM

## 2022-06-15 NOTE — Inpatient Diabetes Management (Signed)
Inpatient Diabetes Program Recommendations  AACE/ADA: New Consensus Statement on Inpatient Glycemic Control (2015)  Target Ranges:  Prepandial:   less than 140 mg/dL      Peak postprandial:   less than 180 mg/dL (1-2 hours)      Critically ill patients:  140 - 180 mg/dL   Lab Results  Component Value Date   GLUCAP 106 (H) 06/15/2022   HGBA1C 11.1 (H) 06/03/2022    Review of Glycemic Control  Diabetes history: DM 2 Outpatient Diabetes medications: Concentrated Humulin R U-500 120 units tid Current orders for Inpatient glycemic control:  Humulin R U-500 120 units tid Novolog 0-20 units tid + hs  A1c 11.1% on 3/8  Note: Pt is very familiar to our team and has recently been in the hospital. [name] usually not placed on home regimen due to hypoglycemia inpatient  Inpatient Diabetes Program Recommendations:    - Consider d/cing Humulin R U-500 insulin -  Add Semglee 60 units bid  When eating recommend Novolog 4 units tid meal coverage.  Thanks,  Tama Headings RN, MSN, BC-ADM Inpatient Diabetes Coordinator Team Pager [phone] (8a-5p)
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
## 2022-06-15 NOTE — ED Provider Notes (Signed)
[facility] Provider Note   CSN: [id number] Arrival date & time: 06/15/22  1053     History  Chief Complaint  Patient presents with   Weakness    From home via EMS for c/o weakness, shortness of breath with exertion, upper abdominal pain, flank pain x 3 days. Denies fevers. Discharged from hospital on 3/17. Alert and oriented, VSS. MAEW.     Jennifer [name] is a 32 y.o. female. With past medical history of diabetes complicated by DKA, pancreatitis, systolic CHF EF [id number], HTN, CKD III, pulmonary hypertension, NASH who presents to the emergency department with weakness.   States she was discharged from the hospital 3 days ago for pancreatitis and has progressively felt worse since discharge. She states she generally feels very weak and tired. [name] having ongoing nausea without vomiting despite taking home antiemetics. [name] having bilateral flank pain and upper abdominal pain. Additionally, states she is feeling progressively more shot of breath with minimal exertion. States she has not been on her lasix since discharge because "they told me to stop taking it until my follow-up appointment." Has associated mild central, non radiating chest pain. She denies palpitations, vomiting, diarrhea, cough or fever.   On chart review, appear she was admitted from 05/29/22-06/12/22. Presented to ED with DKA and hypertriglyceridemia >5000. Stay was complicated by possible hemolytic anemia (elevated LFTs, LDH, anemia), worsening volume overload. Appears they were recommending her to stay longer given worsening creatinine and triglycerides however she refused and left.   Weakness Associated symptoms: abdominal pain, chest pain, nausea and shortness of breath        Home Medications Prior to Admission medications   Medication Sig Start Date End Date Taking? Authorizing Provider  albuterol (VENTOLIN HFA) 108 (90 Base) MCG/ACT inhaler Inhale 1-2 puffs into  the lungs every 6 (six) hours as needed for wheezing or shortness of breath. 12/31/18   [provider]  aspirin EC 81 MG EC tablet Take 1 tablet (81 mg total) by mouth daily. Swallow whole. 12/07/20   Nicole Kindred A, DO  CREON [phone] units CPEP capsule Take 36,000 Units by mouth 3 (three) times daily.    [provider]  DULoxetine (CYMBALTA) 30 MG capsule Take 30 mg by mouth at bedtime. 01/28/19 01/27/23  [provider]  ergocalciferol (VITAMIN D2) 1.25 MG (50000 UT) capsule Take 50,000 Units by mouth every 7 (seven) days.    [provider]  Evolocumab (REPATHA) 140 MG/ML SOSY Inject 140 mg into the skin See admin instructions. Inject 140 mg subcutaneously every other Monday evening    [provider]  fenofibrate 160 MG tablet Take 1 tablet (160 mg total) by mouth daily. 12/07/20   Ezekiel Slocumb, DO  guaiFENesin-dextromethorphan (ROBITUSSIN DM) 100-10 MG/5ML syrup Take 10 mLs by mouth every 6 (six) hours as needed for cough. 03/25/22   Regalado, Belkys A, MD  hydrALAZINE (APRESOLINE) 50 MG tablet Take 1 tablet (50 mg total) by mouth every 8 (eight) hours. 06/12/22 07/12/22  Darliss Cheney, MD  Insulin Pen Needle 32G X 4 MM MISC 1 Device by Does not apply route QID. For use with insulin pens 02/13/22   Donne Hazel, MD  insulin regular human CONCENTRATED (HUMULIN R U-500 KWIKPEN) 500 UNIT/ML KwikPen Inject 120 Units into the skin 3 (three) times daily with meals. 06/01/22   Erick Colace, NP  ivabradine (CORLANOR) 7.5 MG TABS tablet Take 1 tablet (7.5 mg total) by mouth  2 (two) times daily with a meal. 08/02/21   [name], Shaune Pascal, MD  levocetirizine (XYZAL) 5 MG tablet Take 5 mg by mouth daily. 03/05/22   [provider]  levothyroxine (SYNTHROID) 125 MCG tablet Take 1 tablet (125 mcg total) by mouth daily at 6 (six) AM. 12/07/20   Ezekiel Slocumb, DO  nitrofurantoin, macrocrystal-monohydrate, (MACROBID) 100 MG capsule Take 1 capsule  (100 mg total) by mouth every 12 (twelve) hours. 06/01/22   Erick Colace, NP  norethindrone-ethinyl estradiol-FE ([name] 1/20) 1-20 MG-MCG tablet Take 1 tablet by mouth daily.    [provider]  pantoprazole (PROTONIX) 40 MG tablet Take 40 mg by mouth daily. 12/24/19   [provider]  pregabalin (LYRICA) 150 MG capsule Take 300 mg by mouth at bedtime.    [provider]  prochlorperazine (COMPAZINE) 5 MG tablet Take 5 mg by mouth daily.    [provider]  rosuvastatin (CRESTOR) 40 MG tablet Take 40 mg by mouth daily. 08/09/21   [provider]  sildenafil (REVATIO) 20 MG tablet Take 1 tablet (20 mg total) by mouth 3 (three) times daily. 05/11/22   [name], Shaune Pascal, MD  sodium bicarbonate 650 MG tablet Take 650 mg by mouth 2 (two) times daily.    [provider]  spironolactone (ALDACTONE) 25 MG tablet Take 1 tablet (25 mg total) by mouth daily. 06/13/22 07/13/22  Darliss Cheney, MD  tretinoin (RETIN-A) 0.05 % cream Apply 1 application topically at bedtime. 01/17/21   [provider]      Allergies    Ketamine, Erythromycin, Fentanyl, Maitake mushroom [maitake], Penicillin v, Shellfish allergy, Morphine, Penicillins, and Prednisone    Review of Systems   Review of Systems  Respiratory:  Positive for shortness of breath.   Cardiovascular:  Positive for chest pain.  Gastrointestinal:  Positive for abdominal pain and nausea.  Genitourinary:  Positive for flank pain.  Neurological:  Positive for weakness.  All other systems reviewed and are negative.   Physical Exam Updated Vital Signs BP 128/81   Pulse (!) 105   Temp 97.9 F (36.6 C) (Oral)   Resp 20   Ht 4\' 9"  (1.448 m)   Wt 58.5 kg   LMP 04/23/2022   SpO2 100%   BMI 27.92 kg/m  Physical Exam Vitals and nursing note reviewed.  Constitutional:      General: [name] not in acute distress.    Appearance: Normal appearance. [name] ill-appearing. [name] not  toxic-appearing.  HENT:     Head: Normocephalic.     Mouth/Throat:     Mouth: Mucous membranes are dry.     Pharynx: Oropharynx is clear.  Eyes:     General: No scleral icterus.    Extraocular Movements: Extraocular movements intact.  Cardiovascular:     Rate and Rhythm: Regular rhythm. Tachycardia present.     Pulses: Normal pulses.          Radial pulses are 2+ on the right side and 2+ on the left side.     Heart sounds: No murmur heard. Pulmonary:     Effort: Tachypnea present. No accessory muscle usage or respiratory distress.  Abdominal:     General: Abdomen is protuberant. Bowel sounds are normal. There is no distension.     Palpations: Abdomen is soft.     Tenderness: There is abdominal tenderness in the epigastric area. There is no guarding.  Musculoskeletal:     Right lower leg: No edema.  Left lower leg: No edema.  Skin:    General: Skin is warm and dry.     Capillary Refill: Capillary refill takes less than 2 seconds.  Neurological:     General: No focal deficit present.     Mental Status: [name] alert and oriented to person, place, and time. Mental status is at baseline.  Psychiatric:        Mood and Affect: Mood normal.        Behavior: Behavior normal.     ED Results / Procedures / Treatments   Labs (all labs ordered are listed, but only abnormal results are displayed) Labs Reviewed  COMPREHENSIVE METABOLIC PANEL - Abnormal; Notable for the following components:      Result Value   Sodium 134 (*)    Chloride 97 (*)    CO2 20 (*)    Glucose, Bld 113 (*)    BUN 83 (*)    Creatinine, Ser 2.62 (*)    Calcium 10.9 (*)    Total Protein 8.9 (*)    AST 44 (*)    GFR, Estimated 24 (*)    Anion gap 17 (*)    All other components within normal limits  CBC WITH DIFFERENTIAL/PLATELET - Abnormal; Notable for the following components:   Hemoglobin 11.0 (*)    HCT 34.8 (*)    RDW 16.7 (*)    Eosinophils Absolute 0.9 (*)    Abs Immature Granulocytes 0.08 (*)     All other components within normal limits  URINALYSIS, ROUTINE W REFLEX MICROSCOPIC - Abnormal; Notable for the following components:   APPearance CLOUDY (*)    Protein, ur 100 (*)    Leukocytes,Ua TRACE (*)    Bacteria, UA RARE (*)    All other components within normal limits  TRIGLYCERIDES - Abnormal; Notable for the following components:   Triglycerides 1,935 (*)    All other components within normal limits  CBG MONITORING, ED - Abnormal; Notable for the following components:   Glucose-Capillary 106 (*)    All other components within normal limits  I-STAT VENOUS BLOOD GAS, ED - Abnormal; Notable for the following components:   Acid-base deficit 3.0 (*)    Calcium, Ion 1.43 (*)    All other components within normal limits  LIPASE, BLOOD  BRAIN NATRIURETIC PEPTIDE  BETA-HYDROXYBUTYRIC ACID  LACTIC ACID, PLASMA  LACTIC ACID, PLASMA  I-STAT BETA HCG BLOOD, ED ([facility], [facility], [facility] ONLY)  TROPONIN I (HIGH SENSITIVITY)  TROPONIN I (HIGH SENSITIVITY)    EKG None  Radiology DG Chest 2 View  Result Date: 06/15/2022 CLINICAL DATA:  Shortness of breath, weakness, dizziness, CHF EXAM: CHEST - 2 VIEW COMPARISON:  06/03/2022 FINDINGS: Pressure monitoring device LEFT lower lobe pulmonary artery. Normal heart size, mediastinal contours, and pulmonary vascularity. Lungs clear. No pulmonary infiltrate, pleural effusion, or pneumothorax. Osseous structures unremarkable. IMPRESSION: No acute abnormalities. Electronically Signed   By: Lavonia Dana M.D.   On: 06/15/2022 12:00    Procedures Procedures   Medications Ordered in ED Medications  fentaNYL (SUBLIMAZE) injection 50 mcg (has no administration in time range)  ondansetron (ZOFRAN) injection 4 mg (4 mg Intravenous Given 06/15/22 1211)  fentaNYL (SUBLIMAZE) injection 25 mcg (25 mcg Intravenous Given 06/15/22 1252)  lactated ringers bolus 500 mL (500 mLs Intravenous New Bag/Given 06/15/22 1340)    ED Course/ Medical Decision Making/ A&P Clinical  Course as of 06/15/22 1443  Wed Jun 15, 2022  1308 Stable  32 YOF with known CHF/HLD/DM here  with nausea and weakness.  DOE. DKA admit recently [CC]  1346 Personally evaluated at bedside.  She has not had any p.o. tolerance since her recent discharge.  Per notes she was still not tolerating p.o. intake at that time.  Now she appears clinically dehydrated is having active nausea vomiting.  Likely continuation of her pancreatitis.  Will plan for admission for further care and management. [CC]    Clinical Course User Index [CC] Tretha Sciara, MD    Medical Decision Making Amount and/or Complexity of Data Reviewed Labs: ordered. Radiology: ordered.  Risk Prescription drug management. Decision regarding hospitalization.  Initial Impression and Ddx 32 year old female who presents to the emergency department with nausea, vomiting Patient PMH that increases complexity of ED encounter: CHF, diabetes, PCOS, hyperlipidemia, hypertriglyceridemia Differential: Broad to include DKA, pancreatitis, gastroenteritis, SBO, etc.  Interpretation of Diagnostics I independent reviewed and interpreted the labs as followed: Mildly acidotic at 7.28, anion gap of 17, glucose of 113, creatinine stable at 2.62, triglycerides at 1900, BMP and troponin are negative, lipase negative  - I independently visualized the following imaging with scope of interpretation limited to determining acute life threatening conditions related to emergency care: Chest x-ray, which revealed no acute findings  Patient Reassessment and Ultimate Disposition/Management 32 year old female who presents to the emergency department with nausea and vomiting.  [name] overall chronically ill-appearing.  Protuberant abdomen, mildly tachypneic.  Recent admission for DKA, hypertriglyceridemia, acute on chronic heart failure.  She left prior to hospitalist and cardiology feeling comfortable with her going home just a few days ago.  Will reobtain  labs, new triglyceride level, VBG, BHB, BNP and troponin.  pH of 7.28 and she does have mild anion gap to 17.  Her glucose is normal so I will add on a lactic and BHB.  I do suspect she may be in some level of ketoacidosis.  She has had no p.o. intake essentially since her discharge and may have starvation ketosis at this time.  Giving her a small amount of IV fluids and Zofran. -Not pregnant -No UTI  Creatinine is stable at 2.62.  No AKI  Chest x-ray without fluid volume overload, BNP is only 80.  Do not suspect a acute heart failure at this time.  Her troponin is negative, EKG without ischemia or infarction so doubt ACS.  [name] mildly tachycardic which I think is likely from dehydration.   I repeated triglyceride level given this was a large concern on her previous admission which is slightly uptick to 1900 from her discharge.  She has a multitude of problems going on at this time including starvation ketosis, inability to tolerate p.o., worsening shortness of breath and not on her diuretic so likely moving towards acute on chronic CHF exacerbation.  Feel that she is most appropriate for admission at this time for ongoing and completion of her workup that was not finished on her recent discharge at the patient's discretion.  [name] agreeable to admission at this time.  She was also evaluated by Dr. Oswald Hillock, ED attending who agrees with the plan of care.  Consulted and spoke with Dr. Roosevelt Locks, hospitalist who agrees to admit the patient.   Patient management required discussion with the following services or consulting groups:  Hospitalist Service  Complexity of Problems Addressed Acute illness or injury that poses threat of life of bodily function  Additional Data Reviewed and Analyzed Further history obtained from: Further history from spouse/family member, Past medical history and medications listed in the  EMR, Recent discharge summary, and Care Everywhere  Patient Encounter Risk  Assessment Use of parenteral controlled substances and Consideration of hospitalization  Final Clinical Impression(s) / ED Diagnoses Final diagnoses:  Nausea and vomiting, unspecified vomiting type    Rx / DC Orders ED Discharge Orders     None         Mickie Hillier, PA-C 06/15/22 1444    Tretha Sciara, MD 06/15/22 1529

## 2022-06-16 DIAGNOSIS — R109 Unspecified abdominal pain: Secondary | ICD-10-CM | POA: Diagnosis not present

## 2022-06-16 LAB — BASIC METABOLIC PANEL
Anion gap: 11 (ref 5–15)
BUN: 70 mg/dL — ABNORMAL HIGH (ref 6–20)
CO2: 18 mmol/L — ABNORMAL LOW (ref 22–32)
Calcium: 9.3 mg/dL (ref 8.9–10.3)
Chloride: 103 mmol/L (ref 98–111)
Creatinine, Ser: 2.34 mg/dL — ABNORMAL HIGH (ref 0.44–1.00)
GFR, Estimated: 28 mL/min — ABNORMAL LOW (ref >60)
Glucose, Bld: 153 mg/dL — ABNORMAL HIGH (ref 70–99)
Potassium: 4.2 mmol/L (ref 3.5–5.1)
Sodium: 132 mmol/L — ABNORMAL LOW (ref 135–145)

## 2022-06-16 LAB — GLUCOSE, CAPILLARY
Glucose-Capillary: 194 mg/dL — ABNORMAL HIGH (ref 70–99)
Glucose-Capillary: 113 mg/dL — ABNORMAL HIGH (ref 70–99)

## 2022-06-16 NOTE — TOC Initial Note (Signed)
Transition of Care [facility]) - Initial/Assessment Note    Patient Details  Name: Jennifer [name] MRN: [id number] Date of Birth: [birth year]  Transition of Care [facility] CM/SW Contact:    Verdell Carmine, RN Phone Number: 06/16/2022, 12:06 PM  Clinical Narrative:                 32 yo recently discharged, refused to stay longer so MD DC, however was very clear that the patient needs to take ownership of medical problems. Returns back with progressive shortness of breath. [name] a confidential patient. Previously listed her mother as one who could have information.  Plan: High risk readmission, encourage patient to have full course of treatment, no DME or HH needs identified at this time, previously financials resources given to patient for SDOH. CM will follow for needs, recommendations, and transitions of care.  Expected Discharge Plan: Home/Self Care Barriers to Discharge: Continued Medical Work up   Patient Goals and CMS Choice            Expected Discharge Plan and Services   Discharge Planning Services: CM Consult   Living arrangements for the past 2 months: Apartment                                      Prior Living Arrangements/Services Living arrangements for the past 2 months: Apartment Lives with:: Self Patient language and need for interpreter reviewed:: Yes        Need for Family Participation in Patient Care: Yes (Comment) Care giver support system in place?: Yes (comment)   Criminal Activity/Legal Involvement Pertinent to Current Situation/Hospitalization: No - Comment as needed  Activities of Daily Living Home Assistive Devices/Equipment: None ADL Screening (condition at time of admission) Patient's cognitive ability adequate to safely complete daily activities?: Yes Is the patient deaf or have difficulty hearing?: No Does the patient have difficulty seeing, even when wearing glasses/contacts?: Yes Does the patient have difficulty concentrating,  remembering, or making decisions?: No Patient able to express need for assistance with ADLs?: Yes Does the patient have difficulty dressing or bathing?: No Independently performs ADLs?: Yes (appropriate for developmental age) Does the patient have difficulty walking or climbing stairs?: No Weakness of Legs: Left Weakness of Arms/Hands: Left  Permission Sought/Granted                  Emotional Assessment       Orientation: : Oriented to Self, Oriented to Place, Oriented to  Time   Psych Involvement: No (comment)  Admission diagnosis:  Intractable abdominal pain [R10.9] Nausea and vomiting, unspecified vomiting type [R11.2] Patient Active Problem List   Diagnosis Date Noted   UTI (urinary tract infection) due to Enterococcus 06/01/2022   E. coli UTI (urinary tract infection) 06/01/2022   DKA (diabetic ketoacidosis) ([place]) 06/01/2022   Pancreatitis 05/29/2022   Hypokalemia 03/23/2022   COVID 03/15/2022   COVID-19 virus infection 03/14/2022   Hyponatremia 03/14/2022   Positive D dimer 03/14/2022   Stage 3b chronic kidney disease (CKD) ([place]) 03/14/2022   Pulmonary hypertension ([place]) 02/06/2022   NASH (nonalcoholic steatohepatitis) 02/06/2022   Obesity (BMI 30-39.9) 02/06/2022   Heart failure ([place]) 12/03/2021   Acute on chronic systolic CHF (congestive heart failure) ([place]) 12/02/2021   Elevated troponin 12/02/2021   Pancytopenia ([place]) 12/02/2021   Amenorrhea 12/02/2021   Hyperosmolar hyperglycemic state (HHS) ([place]) 08/26/2021   Small intestinal bacterial overgrowth (SIBO) 08/26/2021  Lower extremity edema 08/26/2021   Hyperkalemia 08/14/2021   Hx of insulin dependent diabetes mellitus 08/14/2021   CKD (chronic kidney disease) stage 4, GFR 15-29 ml/min (HCC) 05/13/2021   Chest pain 03/10/2021   Acute on chronic combined systolic and diastolic CHF (congestive heart failure) ([place]) 02/09/2021   History of astrocytoma of brain 02/09/2021   Severe hyperglycemia due to  diabetes mellitus ([place]) 01/25/2021   Diarrhea    Elevated transaminase level    Acute combined systolic and diastolic heart failure (HCC)    Nonischemic cardiomyopathy ([place])    Acute decompensated heart failure (HCC)    Elevated liver enzymes    Acute renal failure superimposed on stage 3a chronic kidney disease ([place]) 11/26/2020   Intractable abdominal pain [id number]   Chronic systolic CHF (congestive heart failure) ([place]) 11/23/2020   Essential hypertension 11/23/2020   Hypertriglyceridemia 11/23/2020   Prolonged QT interval 11/23/2020   Type 2 diabetes mellitus with hyperlipidemia ([place]) 10/10/2018   Hypothyroidism 10/10/2018   PCP:  Sue Lush, PA-C Pharmacy:   [id number] - [address], [place] [address] Phone: [phone] Fax: [phone]     Social Determinants of Health (SDOH) Social History: SDOH Screenings   Food Insecurity: No Food Insecurity (06/15/2022)  Housing: Low Risk  (06/15/2022)  Recent Concern: Housing - Medium Risk (04/05/2022)  Transportation Needs: Unmet Transportation Needs (06/15/2022)  Utilities: Not At Risk (06/15/2022)  Alcohol Screen: Low Risk  (12/10/2021)  Depression (PHQ2-9): Low Risk  (12/10/2021)  Financial Resource Strain: High Risk (04/05/2022)  Physical Activity: Sufficiently Active (12/10/2021)  Social Connections: Socially Integrated (12/10/2021)  Stress: No Stress Concern Present (12/10/2021)  Tobacco Use: Low Risk  (06/15/2022)   SDOH Interventions:     Readmission Risk Interventions    06/01/2022   11:13 AM 03/24/2022    5:05 PM 12/07/2021    4:36 PM  Readmission Risk Prevention Plan  Transportation Screening Complete Complete Complete  Medication Review (RN Care Manager) Referral to Pharmacy Complete Complete  PCP or Specialist appointment within 3-5 days of discharge  Patient refused Complete  HRI or [facility] Complete  Complete  SW Recovery Care/Counseling  Consult Complete  Complete  Palliative Care Screening Not Applicable  Not Applicable  Skilled Nursing Facility Not Applicable Not Applicable Not Applicable

## 2022-06-16 NOTE — Hospital Course (Addendum)
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  Patient reported that recently her Repatha was declined by [facility] and she only takes p.o. medication for her hypertriglyceridemia..  Recently hospitalized for recurrent pancreatitis/CHF exacerbation fluid overload uncontrolled hypertriglyceridemia with TG more than 5010 with insulin drip and diuresis seem to be stabilized and patient was discharged after she demanded to be discharged.   ED Course: Borderline tachycardia afebrile, blood pressure 120/75 nonhypoxic.  Blood work showed creatinine 2.6 c recently 2.6-3.1 AST ALT within normal limits total bilirubin 1.0.  Lipase 47.  Triglyceride 1900 appears to be at baseline.  Chest x-ray no acute abnormalities, was given Zofran IV pain medication symptom persisted, admitted for intractable abdominal pain nausea vomiting. [name] tolerating diet pain has improved/stable chronic, Diet is being advanced once  tolerating soft diet she will be discharged home with instruction for follow-up with PCP

## 2022-06-16 NOTE — Discharge Summary (Signed)
Physician Discharge Summary  Jennifer [name] [id number] DOB: 1990-09-29 DOA: 06/15/2022  PCP: Sue Lush, PA-C  Admit date: 06/15/2022 Discharge date: 06/16/2022 Recommendations for Outpatient Follow-up:  Follow up with PCP in 1 weeks-call for appointment Please obtain BMP/CBC in one week  Discharge Dispo: Home Discharge Condition: Stable Code Status:   Code Status: Full Code Diet recommendation:  Diet Order             DIET SOFT Room service appropriate? 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-on 1000 units of Humulin U-500 per day,diabetic neuropathy, gastroparesis 2022, small intestine bacterial overgrowth (SIBO) 2023, chronic HFrEF with LVEF 45-50% 2023, hirsutism/PCOS, hyperparathyroidism, hypothyroidism, CKD stage IIIb/iv-b/l creat twice daily was as low as 1.62 in mid twos to 3, recurrent hospitalization and ED visits, 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  Patient reported that recently her Repatha was declined by [facility] and she only takes p.o. medication for her hypertriglyceridemia..  Recently hospitalized for recurrent pancreatitis/CHF exacerbation fluid overload uncontrolled hypertriglyceridemia with TG more than 5010 with insulin  drip and diuresis seem to be stabilized and patient was discharged after she demanded to be discharged.   ED Course: Borderline tachycardia afebrile, blood pressure 120/75 nonhypoxic.  Blood work showed creatinine 2.6 c recently 2.6-3.1 AST ALT within normal limits total bilirubin 1.0.  Lipase 47.  Triglyceride 1900 appears to be at baseline.  Chest x-ray no acute abnormalities, was given Zofran IV pain medication symptom persisted, admitted for intractable abdominal pain nausea vomiting. [name] tolerating diet pain has improved/stable chronic, Diet is being advanced once tolerating soft diet she will be discharged home with instruction for follow-up with PCP  Followed up with the patient she voiced that she is doing well and would like to be discharged and she was discharged in medically stable condition  Discharge Diagnoses:  Principal Problem:   Intractable abdominal pain Active Problems:   Hypertriglyceridemia   Essential hypertension   Acute combined systolic and diastolic heart failure (HCC)  Intractable abdominal pain nauseous vomiting -Probably multiple factorial, clinically suspect recurrent pancreatitis, with history of gastroparesis also suspect recurrent gastroparesis. Managed conservatively, improved, will discharge her on home regimen with instruction for follow-up with her PCP and outpatient team,   Chronic HFrEF with pulmonary hypertension: Volume depletion admission needing IV fluids, at this time is stable continue home meds upon discharge IDDM with insulin resistance: Improved continue home regimen Recent E. coli UTI: She will continue and complete her nitrofurantoin CKD stage III: At this time renal function is stable Recent Labs    06/07/22 0416 06/08/22 0516 06/09/22 1109 06/09/22 1738 06/10/22 0407 06/11/22 0414 06/12/22 0409 06/12/22 1459 06/15/22 1105 06/16/22 0334  BUN 41* 41* 53* 56* 53* 52* 69* 73* 83* 70*  CREATININE 2.26* 2.05* 2.63* 3.22* 2.64*  2.59* 3.13* 2.97* 2.62* 2.34*     History  of hypertriglyceridemia:TRG level stable.Continue fenofibrate   Consults: none Subjective: Alert and oriented resting comfortably mild nausea and happened about the same overall feels she is improved and would be okay going home today  Respiration heart rate BP stable, on room air Labs shows creatinine down to 2.3 lactic acid is normalized troponin negative BNP 88, lactic acidosis resolved.  Discharge Exam: Vitals:   06/16/22 0501 06/16/22 0755  BP: (!) 152/89 135/82  Pulse: 93 88  Resp: 13   Temp: 98.1 F (36.7 C) 98.2 F (36.8 C)  SpO2: 95% 98%   General: Pt is alert, awake, not in acute distress Cardiovascular: RRR, S1/S2 +, no rubs, no gallops Respiratory: CTA bilaterally, no wheezing, no rhonchi Abdominal: Soft, NT, ND, bowel sounds + Extremities: no edema, no cyanosis  Discharge Instructions  Discharge Instructions     Discharge instructions   Complete by: As directed    Please call call MD or return to ER for similar or worsening recurring problem that brought you to hospital or if any fever,nausea/vomiting,abdominal pain, uncontrolled pain, chest pain,  shortness of breath or any other alarming symptoms.  Please follow-up your doctor as instructed in a week time and call the office for appointment.  Please avoid alcohol, smoking, or any other illicit substance and maintain healthy habits including taking your regular medications as prescribed.  You were cared for by a hospitalist during your hospital stay. If you have any questions about your discharge medications or the care you received while you were in the hospital after you are discharged, you can call the unit and ask to speak with the hospitalist on call if the hospitalist that took care of you is not available.  Once you are discharged, your primary care physician will handle any further medical issues. Please note that NO REFILLS for any discharge medications will  be authorized once you are discharged, as it is imperative that you return to your primary care physician (or establish a relationship with a primary care physician if you do not have one) for your aftercare needs so that they can reassess your need for medications and monitor your lab values   Increase activity slowly   Complete by: As directed       Allergies as of 06/16/2022       Reactions   Ketamine Other (See Comments)   In vegetative state for 15 minutes per pt   Erythromycin    unk   Fentanyl Nausea And Vomiting   M[place]ake Mushroom [maitake] Itching   Itchy throat   Penicillin V Itching   Gi upset   Shellfish Allergy Other (See Comments)   Pt has never had shellfish but tested positive on allergy test. Pt states contrast in CT is okay   Morphine Itching, Rash   Penicillins Rash   Has patient had a PCN reaction causing immediate rash, facial/tongue/throat swelling, SOB or lightheadedness with hypotension: Y Has patient had a PCN reaction causing severe rash involving mucus membranes or skin necrosis: Y Has patient had a PCN reaction that required hospitalization: N Has patient had a PCN reaction occurring within the last 10 years: Y If all of the above answers are "NO", then may proceed with Cephalosporin use.   Prednisone Rash        Medication List     TAKE these medications    albuterol 108 (90 Base) MCG/ACT inhaler Commonly known as: VENTOLIN HFA Inhale 1-2 puffs into the lungs every 6 (six) hours as needed for  wheezing or shortness of breath.   aspirin EC 81 MG tablet Take 1 tablet (81 mg total) by mouth daily. Swallow whole.   Creon 36000 UNITS Cpep capsule Generic drug: lipase/protease/amylase Take 36,000 Units by mouth 3 (three) times daily.   DULoxetine 30 MG capsule Commonly known as: CYMBALTA Take 30 mg by mouth at bedtime.   ergocalciferol 1.25 MG (50000 UT) capsule Commonly known as: VITAMIN D2 Take 50,000 Units by mouth every 7 (seven) days.    fenofibrate 160 MG tablet Take 1 tablet (160 mg total) by mouth daily.   guaiFENesin-dextromethorphan 100-10 MG/5ML syrup Commonly known as: ROBITUSSIN DM Take 10 mLs by mouth every 6 (six) hours as needed for cough.   HumuLIN R U-500 KwikPen 500 UNIT/ML KwikPen Generic drug: insulin regular human CONCENTRATED Inject 120 Units into the skin 3 (three) times daily with meals.   hydrALAZINE 50 MG tablet Commonly known as: APRESOLINE Take 1 tablet (50 mg total) by mouth every 8 (eight) hours.   Insulin Pen Needle 32G X 4 MM Misc 1 Device by Does not apply route QID. For use with insulin pens   ivabradine 7.5 MG Tabs tablet Commonly known as: CORLANOR Take 1 tablet (7.5 mg total) by mouth 2 (two) times daily with a meal.   levocetirizine 5 MG tablet Commonly known as: XYZAL Take 5 mg by mouth daily.   levothyroxine 125 MCG tablet Commonly known as: SYNTHROID Take 1 tablet (125 mcg total) by mouth daily at 6 (six) AM.   nitrofurantoin (macrocrystal-monohydrate) 100 MG capsule Commonly known as: MACROBID Take 1 capsule (100 mg total) by mouth every 12 (twelve) hours.   pantoprazole 40 MG tablet Commonly known as: PROTONIX Take 40 mg by mouth daily.   pregabalin 150 MG capsule Commonly known as: LYRICA Take 300 mg by mouth at bedtime.   prochlorperazine 5 MG tablet Commonly known as: COMPAZINE Take 5 mg by mouth daily.   Repatha 140 MG/ML Sosy Generic drug: Evolocumab Inject 140 mg into the skin See admin instructions. Inject 140 mg subcutaneously every other Monday evening   rosuvastatin 40 MG tablet Commonly known as: CRESTOR Take 40 mg by mouth daily.   sildenafil 20 MG tablet Commonly known as: REVATIO Take 1 tablet (20 mg total) by mouth 3 (three) times daily.   sodium bicarbonate 650 MG tablet Take 650 mg by mouth 2 (two) times daily.   spironolactone 25 MG tablet Commonly known as: ALDACTONE Take 1 tablet (25 mg total) by mouth daily.   tretinoin 0.05  % cream Commonly known as: RETIN-A Apply 1 application topically at bedtime.        Follow-up Information     Sue Lush, PA-C Follow up in 1 week(s).   Specialty: Physician Assistant Contact information: [place] [address] [phone]                Allergies  Allergen Reactions   Ketamine Other (See Comments)    In vegetative state for 15 minutes per pt   Erythromycin     unk   Fentanyl Nausea And Vomiting   Maitake Mushroom [Maitake] Itching    Itchy throat   Penicillin V Itching    Gi upset   Shellfish Allergy Other (See Comments)    Pt has never had shellfish but tested positive on allergy test. Pt states contrast in CT is okay   Morphine Itching and Rash   Penicillins Rash    Has patient had  a PCN reaction causing immediate rash, facial/tongue/throat swelling, SOB or lightheadedness with hypotension: Y Has patient had a PCN reaction causing severe rash involving mucus membranes or skin necrosis: Y Has patient had a PCN reaction that required hospitalization: N Has patient had a PCN reaction occurring within the last 10 years: Y If all of the above answers are "NO", then may proceed with Cephalosporin use.    Prednisone Rash    The results of significant diagnostics from this hospitalization (including imaging, microbiology, ancillary and laboratory) are listed below for reference.    Microbiology: No results found for this or any previous visit (from the past 240 hour(s)).  Procedures/Studies: DG Chest 2 View  Result Date: 06/15/2022 CLINICAL DATA:  Shortness of breath, weakness, dizziness, CHF EXAM: CHEST - 2 VIEW COMPARISON:  06/03/2022 FINDINGS: Pressure monitoring device LEFT lower lobe pulmonary artery. Normal heart size, mediastinal contours, and pulmonary vascularity. Lungs clear. No pulmonary infiltrate, pleural effusion, or pneumothorax. Osseous structures unremarkable. IMPRESSION: No acute abnormalities.  Electronically Signed   By: Lavonia Dana M.D.   On: 06/15/2022 12:00   ECHOCARDIOGRAM COMPLETE  Result Date: 06/10/2022    ECHOCARDIOGRAM REPORT   Patient Name:   Jennifer [name] Date of Exam: 06/10/2022 Medical Rec #:  [id number]         Height:       57.0 in Accession #:    [id number]        Weight:       128.3 lb Date of Birth:  1990-08-29         BSA:          1.489 m Patient Age:    32 years          BP:           147/88 mmHg Patient Gender: F                 HR:           95 bpm. Exam Location:  Inpatient Procedure: 2D Echo, Color Doppler and Cardiac Doppler Indications:    I34.0 Nonrheumatic mitral (valve) insufficiency  History:        Patient has prior history of Echocardiogram examinations, most                 recent 06/04/2022. CHF; Risk Factors:Hypertension, Diabetes and                 Dyslipidemia.  Sonographer:    Raquel Sarna Senior RDCS Referring Phys: Wilmer Floor SIMMONS IMPRESSIONS  1. Left ventricular ejection fraction, by estimation, is 45 to 50%. The left ventricle has mildly decreased function. The left ventricle demonstrates global hypokinesis. Left ventricular diastolic parameters are indeterminate.  2. Right ventricular systolic function is normal. The right ventricular size is normal. There is normal pulmonary artery systolic pressure. The estimated right ventricular systolic pressure is [id number] mmHg.  3. Left atrial size was mildly dilated.  4. Right atrial size was mildly dilated.  5. A small pericardial effusion is present. There is no evidence of cardiac tamponade.  6. The mitral valve is grossly normal. Moderate mitral valve regurgitation. No evidence of mitral stenosis.  7. Tricuspid valve regurgitation is mild to moderate.  8. The aortic valve is grossly normal. Aortic valve regurgitation is trivial.  9. The inferior vena cava is normal in size with <50% respiratory variability, suggesting right atrial pressure of 8 mmHg. Comparison(s): Prior images reviewed side by side. LV function, MR,  and TR all  mildly improved compared to prior. FINDINGS  Left Ventricle: Left ventricular ejection fraction, by estimation, is 45 to 50%. The left ventricle has mildly decreased function. The left ventricle demonstrates global hypokinesis. The left ventricular internal cavity size was normal in size. There is  no left ventricular hypertrophy. Left ventricular diastolic parameters are indeterminate. Right Ventricle: The right ventricular size is normal. Right vetricular wall thickness was not well visualized. Right ventricular systolic function is normal. There is normal pulmonary artery systolic pressure. The tricuspid regurgitant velocity is 2.46 m/s, and with an assumed right atrial pressure of 8 mmHg, the estimated right ventricular systolic pressure is [id number] mmHg. Left Atrium: Left atrial size was mildly dilated. Right Atrium: Right atrial size was mildly dilated. Pericardium: A small pericardial effusion is present. There is no evidence of cardiac tamponade. Mitral Valve: The mitral valve is grossly normal. Moderate mitral valve regurgitation. No evidence of mitral valve stenosis. Tricuspid Valve: The tricuspid valve is grossly normal. Tricuspid valve regurgitation is mild to moderate. No evidence of tricuspid stenosis. Aortic Valve: The aortic valve is grossly normal. Aortic valve regurgitation is trivial. Pulmonic Valve: The pulmonic valve was not well visualized. Pulmonic valve regurgitation is mild. No evidence of pulmonic stenosis. Aorta: The aortic root, ascending aorta and aortic arch are all structurally normal, with no evidence of dilitation or obstruction. Venous: The inferior vena cava is normal in size with less than 50% respiratory variability, suggesting right atrial pressure of 8 mmHg. IAS/Shunts: The atrial septum is grossly normal.  LEFT VENTRICLE PLAX 2D LVIDd:         4.95 cm     Diastology LVIDs:         3.90 cm     LV e' medial:    5.55 cm/s LV PW:         0.90 cm     LV E/e' medial:  16.2 LV  IVS:        0.60 cm     LV e' lateral:   7.51 cm/s LVOT diam:     1.80 cm     LV E/e' lateral: 12.0 LV SV:         47 LV SV Index:   31 LVOT Area:     2.54 cm  LV Volumes (MOD) LV vol d, MOD A2C: 98.1 ml LV vol d, MOD A4C: 92.3 ml LV vol s, MOD A2C: 45.2 ml LV vol s, MOD A4C: 46.7 ml LV SV MOD A2C:     52.9 ml LV SV MOD A4C:     92.3 ml LV SV MOD BP:      51.3 ml RIGHT VENTRICLE RV S prime:     12.40 cm/s TAPSE (M-mode): 2.0 cm LEFT ATRIUM             Index        RIGHT ATRIUM           Index LA diam:        2.80 cm 1.88 cm/m   RA Area:     15.50 cm LA Vol (A2C):   35.6 ml 23.90 ml/m  RA Volume:   38.70 ml  25.98 ml/m LA Vol (A4C):   26.5 ml 17.79 ml/m LA Biplane Vol: 31.1 ml 20.88 ml/m  AORTIC VALVE LVOT Vmax:   106.00 cm/s LVOT Vmean:  77.400 cm/s LVOT VTI:    0.184 m  AORTA Ao Root diam: 2.50 cm Ao Asc diam:  2.80 cm MITRAL VALVE  TRICUSPID VALVE MV Area (PHT): 4.17 cm    TR Peak grad:   24.2 mmHg MV Decel Time: 182 msec    TR Vmax:        246.00 cm/s MV E velocity: 90.10 cm/s MV A velocity: 96.40 cm/s  SHUNTS MV E/A ratio:  0.93        Systemic VTI:  0.18 m                            Systemic Diam: 1.80 cm Buford Dresser MD Electronically signed by Buford Dresser MD Signature Date/Time: 06/10/2022/4:03:10 PM    Final    [place] EKG SITE RITE  Result Date: 06/08/2022 If Site Rite image not attached, placement could not be confirmed due to current cardiac rhythm.  ECHOCARDIOGRAM COMPLETE  Result Date: 06/04/2022    ECHOCARDIOGRAM REPORT   Patient Name:   HELEENA LEHMKUHL Date of Exam: 06/04/2022 Medical Rec #:  [id number]         Height:       57.0 in Accession #:    [id number]        Weight:       158.5 lb Date of Birth:  [birth year]         BSA:          1.629 m Patient Age:    32 years          BP:           112/75 mmHg Patient Gender: F                 HR:           66 bpm. Exam Location:  Inpatient Procedure: 2D Echo, Color Doppler, Cardiac Doppler and Intracardiac             Opacification Agent STAT ECHO                                 MODIFIED REPORT:   This report was modified by Rudean Haskell MD on 06/04/2022 due to Stella.  Indications:     [id number] Acute systolic (congestive) heart failure  History:         Patient has prior history of Echocardiogram examinations, most                  recent 12/03/2021. CHF; Risk Factors:Hypertension, Diabetes and                  Dyslipidemia.  Sonographer:     Raquel Sarna Senior RDCS Referring Phys:  [id number] [place] Diagnosing Phys: Rudean Haskell MD IMPRESSIONS  1. Left ventricular ejection fraction, by estimation, is 30 to 35%. The left ventricle has moderately decreased function. Left ventricular endocardial border not optimally defined to evaluate regional wall motion. Left ventricular diastolic parameters are indeterminate.  2. Right ventricular systolic function is normal. The right ventricular size is is dilated. There is mildly elevated pulmonary artery systolic pressure. The estimated right ventricular systolic pressure is Q000111Q mmHg.  3. A small pericardial effusion is present. The pericardial effusion is posterior to the left ventricle.  4. The mitral valve is normal in structure. Moderate to severe mitral valve  regurgitation.  5. TV best assessed in image 17. Tricuspid valve regurgitation is severe.  6. The aortic valve was not well visualized. Aortic valve regurgitation is mild.  7. Pulmonic valve regurgitation is moderate.  8. The inferior vena cava is dilated in size with <50% respiratory variability, suggesting right atrial pressure of 15 mmHg. Comparison(s): LV function is worse, mitral regurgitation is worse, tricuspid regurgitation is worse. Conclusion(s)/Recommendation(s): Reviewed with primary team. FINDINGS  Left Ventricle: Left ventricular ejection fraction, by estimation, is 30 to 35%. The left ventricle has moderately decreased function. Left ventricular  endocardial border not optimally defined to evaluate regional wall motion. Definity contrast agent was given IV to delineate the left ventricular endocardial borders. The left ventricular internal cavity size was normal in size. There is no left ventricular hypertrophy. Left ventricular diastolic parameters are indeterminate. Right Ventricle: The right ventricular size is is dilated. Right vetricular wall thickness was not well visualized. Right ventricular systolic function is normal. There is mildly elevated pulmonary artery systolic pressure. The tricuspid regurgitant velocity is 2.35 m/s, and with an assumed right atrial pressure of 15 mmHg, the estimated right ventricular systolic pressure is Q000111Q mmHg. Left Atrium: Left atrial size was normal in size. Right Atrium: Right atrial size was normal in size. Pericardium: A small pericardial effusion is present. The pericardial effusion is posterior to the left ventricle. Mitral Valve: The mitral valve is normal in structure. Moderate to severe mitral valve regurgitation. Tricuspid Valve: TV best assessed in image 17. The tricuspid valve is normal in structure. Tricuspid valve regurgitation is severe. Aortic Valve: The aortic valve was not well visualized. Aortic valve regurgitation is mild. Pulmonic Valve: The pulmonic valve was normal in structure. Pulmonic valve regurgitation is moderate. Aorta: The aortic root and ascending aorta are structurally normal, with no evidence of dilitation. Venous: The inferior vena cava is dilated in size with less than 50% respiratory variability, suggesting right atrial pressure of 15 mmHg. IAS/Shunts: No atrial level shunt detected by color flow Doppler.  LEFT VENTRICLE PLAX 2D LVIDd:         4.20 cm     Diastology LVIDs:         3.05 cm     LV e' medial:    7.18 cm/s LV PW:         1.10 cm     LV E/e' medial:  15.3 LV IVS:        0.90 cm     LV e' lateral:   11.30 cm/s LVOT diam:     1.80 cm     LV E/e' lateral: 9.7 LV SV:          44 LV SV Index:   27 LVOT Area:     2.54 cm  LV Volumes (MOD) LV vol d, MOD A4C: 83.1 ml LV vol s, MOD A4C: 52.4 ml LV SV MOD A4C:     83.1 ml RIGHT VENTRICLE RV S prime:     12.70 cm/s TAPSE (M-mode): 2.4 cm LEFT ATRIUM             Index        RIGHT ATRIUM           Index LA diam:        3.30 cm 2.03 cm/m   RA Area:     12.30 cm LA Vol (A2C):   39.7 ml 24.36 ml/m  RA Volume:   28.20 ml  17.31 ml/m LA Vol (A4C):   37.2 ml  22.83 ml/m LA Biplane Vol: 38.4 ml 23.57 ml/m  AORTIC VALVE LVOT Vmax:   97.90 cm/s LVOT Vmean:  69.900 cm/s LVOT VTI:    0.173 m  AORTA Ao Root diam: 2.20 cm Ao Asc diam:  3.00 cm MITRAL VALVE                TRICUSPID VALVE MV Area (PHT): 4.77 cm     TR Peak grad:   22.1 mmHg MV Decel Time: 159 msec     TR Vmax:        235.00 cm/s MV E velocity: 110.00 cm/s MV A velocity: 65.70 cm/s   SHUNTS MV E/A ratio:  1.67         Systemic VTI:  0.17 m                             Systemic Diam: 1.80 cm Rudean Haskell MD Electronically signed by Rudean Haskell MD Signature Date/Time: 06/04/2022/2:16:27 PM    Final (Updated)    DG Chest Port 1 View  Result Date: 06/03/2022 CLINICAL DATA:  Hypoxia. EXAM: PORTABLE CHEST 1 VIEW COMPARISON:  Chest x-ray dated June 01, 2022. FINDINGS: New right upper extremity PICC line with tip in the proximal right atrium. Unchanged cardiomegaly. Diffuse interstitial thickening and hazy airspace opacities throughout both lungs have mildly worsened in the interval. No pneumothorax or large pleural effusion. No acute osseous abnormality. IMPRESSION: 1. Mildly worsened pulmonary edema. 2. New right upper extremity PICC line with tip in the proximal right atrium. Electronically Signed   By: Titus Dubin M.D.   On: 06/03/2022 10:19   CT ABDOMEN PELVIS WO CONTRAST  Result Date: 06/01/2022 CLINICAL DATA:  anemia, rule out retroperitoneal bleed or hematoma EXAM: CT ABDOMEN AND PELVIS WITHOUT CONTRAST TECHNIQUE: Multidetector CT imaging of the abdomen and  pelvis was performed following the standard protocol without IV contrast. RADIATION DOSE REDUCTION: This exam was performed according to the departmental dose-optimization program which includes automated exposure control, adjustment of the mA and/or kV according to patient size and/or use of iterative reconstruction technique. COMPARISON:  Chest XR, concurrent. CT AP, 05/29/2022 and 03/30/2022. CT CAP, 05/12/2022. FINDINGS: Lower chest: Cardiomegaly with LEFT ventricular enlargement, greater than expected in a patient of this age. Hypodensity of the cardiac blood pool. Ground-glass opacities and interlobular septal thickening at the lung bases. Trace LEFT pleural effusion. Hepatobiliary: Hepatomegaly. Diffuse hypodensity of liver. No gallstones, gallbladder wall thickening, or biliary dilatation. Pancreas: No pancreatic ductal dilatation or surrounding inflammatory changes. Spleen: Splenomegaly.  No focal abnormality. Adrenals/Urinary Tract: Adrenal glands are unremarkable. Kidneys are normal, without renal calculi, focal lesion, or hydronephrosis. Mild distention of the urinary bladder. Stomach/Bowel: Stomach is within normal limits. Appendix is not definitively visualized. No evidence of bowel wall thickening, distention, or inflammatory changes. Vascular/Lymphatic: Mild burden of distal aortic atherosclerosis, though greater than expected in a patient of this age. No enlarged abdominal or pelvic lymph nodes. Reproductive: Uterus and adnexa are unremarkable. Other: Rotund abdomen. Mild body wall edema. Multifocal anterior abdominal wall subcutaneous contusions, likely injection sites. Similar appearance of subcutaneous tissue thickening at the RIGHT groin and months pubis. No abdominopelvic ascites. Musculoskeletal: No acute or significant osseous findings. IMPRESSION: 1. No acute abdominopelvic process. 2. Cardiomegaly with LEFT ventricular enlargement, greater than expected in a patient of this age. Hypodensity  of the cardiac blood pool as can be seen with anemia. 3. Bibasilar ground-glass opacities and interlobular septal thickening, suspicious for  developing pulmonary edema. 4. Hepatomegaly and steatosis. Additional incidental, chronic and senescent findings as above. Electronically Signed   By: Michaelle Birks M.D.   On: 06/01/2022 19:09   [place] EKG SITE RITE  Result Date: 06/01/2022 If Site Rite image not attached, placement could not be confirmed due to current cardiac rhythm.  DG Chest Port 1 View  Result Date: 06/01/2022 CLINICAL DATA:  Shortness of breath. EXAM: PORTABLE CHEST 1 VIEW COMPARISON:  May 31, 2022. FINDINGS: Stable cardiomediastinal silhouette. Minimal bibasilar subsegmental atelectasis is noted. Bony thorax is unremarkable. IMPRESSION: Minimal bibasilar subsegmental atelectasis. Electronically Signed   By: Marijo Conception M.D.   On: 06/01/2022 13:34   DG CHEST PORT 1 VIEW  Result Date: 05/31/2022 CLINICAL DATA:  Shortness of breath. Wheezing. EXAM: PORTABLE CHEST 1 VIEW COMPARISON:  Chest radiograph 05/12/2022. Lung bases from abdominal CT 05/29/2022 FINDINGS: Heart is normal in size. Stable mediastinal contours. There is mild diffuse peribronchial thickening. Question of trace left pleural effusion. Mild bibasilar atelectasis without confluent airspace disease. No pneumothorax. Limited assessment, no acute osseous findings. IMPRESSION: Mild diffuse peribronchial thickening, can be seen with asthma or bronchitis. Question trace left pleural effusion. Electronically Signed   By: Keith Rake M.D.   On: 05/31/2022 16:17   CT ABDOMEN PELVIS W CONTRAST  Result Date: 05/29/2022 CLINICAL DATA:  Pancreatitis, acute, severe EXAM: CT ABDOMEN AND PELVIS WITHOUT AND WITH CONTRAST TECHNIQUE: Multidetector CT imaging of the abdomen and pelvis was performed following the standard protocol before and following the bolus administration of intravenous contrast. RADIATION DOSE REDUCTION: This exam was  performed according to the departmental dose-optimization program which includes automated exposure control, adjustment of the mA and/or kV according to patient size and/or use of iterative reconstruction technique. CONTRAST:  8mL OMNIPAQUE IOHEXOL 300 MG/ML  SOLN COMPARISON:  CT CAP, 05/12/2022. CT AP, 03/30/2022. MRCP, 05/11/2022. FINDINGS: Lower chest: No acute abnormality. Similar appearance of calcification at the LEFT lung base. Hepatobiliary: Hepatomegaly. No focal liver abnormality is seen. No gallstones, gallbladder wall thickening, or biliary dilatation. Pancreas: No pancreatic ductal dilatation or surrounding inflammatory changes. Spleen: Splenomegaly.  No focal abnormality. Adrenals/Urinary Tract: Adrenal glands are unremarkable. Kidneys are normal, without renal calculi, focal lesion, or hydronephrosis. Bladder is unremarkable. Stomach/Bowel: Stomach is within normal limits. Appendix is not definitively visualized. No evidence of bowel wall thickening, distention, or inflammatory changes. Vascular/Lymphatic: Distal aortic and bifurcation calcified and noncalcified atherosclerosis, mild burden. No enlarged abdominal or pelvic lymph nodes. Reproductive: Uterus and bilateral adnexa are unremarkable. Other: Rotund abdomen. Anterior abdominal wall contusions, likely injection sites. Improved appearance of the subcutaneous tissue thickening at the RIGHT groin and mons pubis, likely resolving cellulitis versus sebaceous cyst. No abdominal wall hernia or abnormality. No abdominopelvic ascites. Musculoskeletal: No acute or significant osseous findings. IMPRESSION: 1. No acute abdominopelvic process. 2. Hepatosplenomegaly. Additional incidental, chronic and senescent findings as above. Electronically Signed   By: Michaelle Birks M.D.   On: 05/29/2022 12:13    Labs: BNP (last 3 results) Recent Labs    06/04/22 0400 06/05/22 0432 06/15/22 1105  BNP 550.1* 339.5* [id number]   Basic Metabolic Panel: Recent Labs   Lab 06/09/22 1738 06/10/22 0407 06/11/22 0414 06/12/22 0409 06/12/22 1459 06/12/22 1734 06/12/22 1742 06/15/22 1105 06/15/22 1137 06/16/22 0334  NA 133*   < > 129* 131* 130*  --  133* 134* 136 132*  K 4.4   < > 4.7 4.5 5.8* 5.5* 5.0 4.3 4.5 4.2  CL 89*   < >  92* 93* 93*  --   --  97*  --  103  CO2 25   < > 25 25 25   --   --  20*  --  18*  GLUCOSE 140*   < > 168* 124* 162*  --   --  113*  --  153*  BUN 56*   < > 52* 69* 73*  --   --  83*  --  70*  CREATININE 3.22*   < > 2.59* 3.13* 2.97*  --   --  2.62*  --  2.34*  CALCIUM 9.2   < > 9.4 9.4 9.4  --   --  10.9*  --  9.3  MG 2.0  --  1.9 2.6*  --   --   --   --   --   --    < > = values in this interval not displayed.   Liver Function Tests: Recent Labs  Lab 06/11/22 0414 06/15/22 1105  AST 52* 44*  ALT 41 40  ALKPHOS 141* 114  BILITOT 1.1 1.0  PROT 7.6 8.9*  ALBUMIN 2.9* 4.1   Recent Labs  Lab 06/15/22 1105  LIPASE 47   No results for input(s): "AMMONIA" in the last 168 hours. CBC: Recent Labs  Lab 06/12/22 1742 06/15/22 1105 06/15/22 1137  WBC  --  10.2  --   NEUTROABS  --  6.6  --   HGB 10.5* 11.0* 12.2  HCT 31.0* 34.8* 36.0  MCV  --  86.1  --   PLT  --  346  --    Cardiac Enzymes: No results for input(s): "CKTOTAL", "CKMB", "CKMBINDEX", "TROPONINI" in the last 168 hours. BNP: Invalid input(s): "POCBNP" CBG: Recent Labs  Lab 06/12/22 1625 06/15/22 1143 06/15/22 1710 06/15/22 2107 06/16/22 0754  GLUCAP 130* 106* 90 223* 194*   D-Dimer No results for input(s): "DDIMER" in the last 72 hours. Hgb A1c No results for input(s): "HGBA1C" in the last 72 hours. Lipid Profile Recent Labs    06/15/22 1140  TRIG 1,935*   Thyroid function studies No results for input(s): "TSH", "T4TOTAL", "T3FREE", "THYROIDAB" in the last 72 hours.  Invalid input(s): "FREET3" Anemia work up No results for input(s): "VITAMINB12", "FOLATE", "FERRITIN", "TIBC", "IRON", "RETICCTPCT" in the last 72 hours. Urinalysis     Component Value Date/Time   COLORURINE YELLOW 06/15/2022 1400   APPEARANCEUR CLOUDY (A) 06/15/2022 1400   LABSPEC 1.015 06/15/2022 1400   PHURINE 5.0 06/15/2022 1400   GLUCOSEU NEGATIVE 06/15/2022 1400   HGBUR NEGATIVE 06/15/2022 1400   BILIRUBINUR NEGATIVE 06/15/2022 1400   KETONESUR NEGATIVE 06/15/2022 1400   PROTEINUR 100 (A) 06/15/2022 1400   NITRITE NEGATIVE 06/15/2022 1400   LEUKOCYTESUR TRACE (A) 06/15/2022 1400   Sepsis Labs Recent Labs  Lab 06/15/22 1105  WBC 10.2   Microbiology No results found for this or any previous visit (from the past 240 hour(s)).   Time coordinating discharge: 25 minutes  SIGNED: Antonieta Pert, MD  [facility] 06/16/2022, 11:34 AM  If 7PM-7AM, please contact night-coverage [email or web address]

## 2022-06-22 ENCOUNTER — Other Ambulatory Visit (HOSPITAL_COMMUNITY): Payer: Self-pay

## 2022-06-22 ENCOUNTER — Ambulatory Visit (HOSPITAL_COMMUNITY)
Admit: 2022-06-22 | Discharge: 2022-06-22 | Disposition: A | Discharge status: Home / Self Care | Payer: Medicaid Other | Attending: Family Medicine | Admitting: Family Medicine

## 2022-06-22 ENCOUNTER — Encounter (HOSPITAL_COMMUNITY): Payer: Self-pay

## 2022-06-22 VITALS — BP 144/90 | HR 120 | Wt 132.0 lb

## 2022-06-22 DIAGNOSIS — Z5986 Financial insecurity: Secondary | ICD-10-CM | POA: Insufficient documentation

## 2022-06-22 DIAGNOSIS — E039 Hypothyroidism, unspecified: Secondary | ICD-10-CM | POA: Diagnosis not present

## 2022-06-22 DIAGNOSIS — R1900 Intra-abdominal and pelvic swelling, mass and lump, unspecified site: Secondary | ICD-10-CM | POA: Diagnosis not present

## 2022-06-22 DIAGNOSIS — I272 Pulmonary hypertension, unspecified: Secondary | ICD-10-CM | POA: Diagnosis not present

## 2022-06-22 DIAGNOSIS — Z7982 Long term (current) use of aspirin: Secondary | ICD-10-CM | POA: Insufficient documentation

## 2022-06-22 DIAGNOSIS — E871 Hypo-osmolality and hyponatremia: Secondary | ICD-10-CM | POA: Diagnosis not present

## 2022-06-22 DIAGNOSIS — K760 Fatty (change of) liver, not elsewhere classified: Secondary | ICD-10-CM | POA: Insufficient documentation

## 2022-06-22 DIAGNOSIS — Z79899 Other long term (current) drug therapy: Secondary | ICD-10-CM | POA: Diagnosis not present

## 2022-06-22 DIAGNOSIS — I2510 Atherosclerotic heart disease of native coronary artery without angina pectoris: Secondary | ICD-10-CM | POA: Diagnosis not present

## 2022-06-22 DIAGNOSIS — N179 Acute kidney failure, unspecified: Secondary | ICD-10-CM | POA: Insufficient documentation

## 2022-06-22 DIAGNOSIS — E282 Polycystic ovarian syndrome: Secondary | ICD-10-CM | POA: Insufficient documentation

## 2022-06-22 DIAGNOSIS — Z7689 Persons encountering health services in other specified circumstances: Secondary | ICD-10-CM | POA: Diagnosis not present

## 2022-06-22 DIAGNOSIS — E781 Pure hyperglyceridemia: Secondary | ICD-10-CM | POA: Diagnosis not present

## 2022-06-22 DIAGNOSIS — K861 Other chronic pancreatitis: Secondary | ICD-10-CM | POA: Insufficient documentation

## 2022-06-22 DIAGNOSIS — I130 Hypertensive heart and chronic kidney disease with heart failure and stage 1 through stage 4 chronic kidney disease, or unspecified chronic kidney disease: Secondary | ICD-10-CM | POA: Diagnosis not present

## 2022-06-22 DIAGNOSIS — Z5982 Transportation insecurity: Secondary | ICD-10-CM | POA: Diagnosis not present

## 2022-06-22 DIAGNOSIS — Z91148 Patient's other noncompliance with medication regimen for other reason: Secondary | ICD-10-CM

## 2022-06-22 DIAGNOSIS — I071 Rheumatic tricuspid insufficiency: Secondary | ICD-10-CM | POA: Diagnosis not present

## 2022-06-22 DIAGNOSIS — E109 Type 1 diabetes mellitus without complications: Secondary | ICD-10-CM

## 2022-06-22 DIAGNOSIS — E119 Type 2 diabetes mellitus without complications: Secondary | ICD-10-CM | POA: Insufficient documentation

## 2022-06-22 DIAGNOSIS — R140 Abdominal distension (gaseous): Secondary | ICD-10-CM

## 2022-06-22 DIAGNOSIS — E875 Hyperkalemia: Secondary | ICD-10-CM | POA: Insufficient documentation

## 2022-06-22 DIAGNOSIS — I5022 Chronic systolic (congestive) heart failure: Secondary | ICD-10-CM

## 2022-06-22 DIAGNOSIS — N184 Chronic kidney disease, stage 4 (severe): Secondary | ICD-10-CM | POA: Diagnosis not present

## 2022-06-22 DIAGNOSIS — H34832 Tributary (branch) retinal vein occlusion, left eye, with macular edema: Secondary | ICD-10-CM | POA: Diagnosis not present

## 2022-06-22 LAB — BASIC METABOLIC PANEL
Anion gap: 15 (ref 5–15)
BUN: 37 mg/dL — ABNORMAL HIGH (ref 6–20)
CO2: 20 mmol/L — ABNORMAL LOW (ref 22–32)
Calcium: 10 mg/dL (ref 8.9–10.3)
Chloride: 102 mmol/L (ref 98–111)
Creatinine, Ser: 1.53 mg/dL — ABNORMAL HIGH (ref 0.44–1.00)
GFR, Estimated: 46 mL/min — ABNORMAL LOW (ref >60)
Glucose, Bld: 110 mg/dL — ABNORMAL HIGH (ref 70–99)
Potassium: 4.3 mmol/L (ref 3.5–5.1)
Sodium: 137 mmol/L (ref 135–145)

## 2022-06-22 LAB — BRAIN NATRIURETIC PEPTIDE: B Natriuretic Peptide: 70.1 pg/mL (ref 0.0–100.0)

## 2022-06-22 MED ORDER — CARVEDILOL 6.25 MG PO TABS: 6.2500 mg | ORAL_TABLET | Freq: Two times a day (BID) | ORAL | 3 refills | Status: DC

## 2022-06-22 NOTE — Progress Notes (Unsigned)
Paramedicine Encounter   Patient ID: Jennifer [name] , female,   DOB: 1991-03-19,32 y.o.,  MRN: [id number]  Pt reports she is having more palpitations and c/p lately.  She states c/p is happening more frequently-more on than off.  She reports its like an achy feeling, sometimes sharp.  Pain does not change with movement/positioning. She does get sob at that time too.   [name] unsure of her torsemide dosing-the admission 2x ago said to hold untoil labs can be repeated but that wasn't ever done and then last week we didn't get that far in the visit b/c she was feeling so poorly last week and I had to send her to ER.   Her CBG's have been lower at home-she has been struggling with keeping it elevated. She has been tweaking her insulin herself to see what dose is working for her.  [name] not able to have the insulin pump due to her taking too much insulin.   She said she feels better. She looks like she feels better today.  She has been taking her carvedilol also.  Met patient in clinic today with provider.   She was referred to genetic counselor for lipid testing, Janett Billow is referring her to lipid clinic for mgmt.  She did not bring her meds today so I will see her tomor morning for med rec.    Weight @ clinic-132 B/P-144/90 P-120 [id number]  Med changes-restart 40mg  torsemide Increase carvedilol to 6.25mg  BID  [name], EMT-Paramedic [phone] 06/22/2022

## 2022-06-22 NOTE — Progress Notes (Signed)
Advanced Heart Failure Clinic Note   PCP: Sue Lush, PA-C HF Cardiologist: Dr. 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   Has been followed by Dr. Einar Gip for her HF.    Admitted 11/22/20 with ab pain and diarrhea. Ab CT concerning for vasculitis (no pancreatitis). Her TG were found to be 4030. She was started on an insulin/dextrose infusion. Cardiology and Nephrology were consulted after her creatinine and LFTs continued to rise and she began developing hypotension. Echo 11/28/20 EF was found to be 20-25% with moderate RV dysfunction and mild septal flattening. Started on DBA 2.5. AHF consulted for further management. PICC placed to follow CVP and Co-Ox. BP were high and milrinone added to augment diuresis. cMRI c/w prior myocarditis vs sarcoid (see below). Mod pericardial effusion also noted. LVEF 29%. RV normal. She underwent R/LHC showing single vessel RCA occlusion, RA 1, LVEDP 17, Fick CO 5.6/CI 3.88. She was able to be weaned off inotropes and GDMT titrated.     Post hospital follow up, she was drinking >2L/day fluid and eating more salty foods, volume was up, ReDs 43%, instructed to increase lasix to 20 mg bid x 3 days. She followed up with [name] with [facility] 12/17/20 for a 2nd opinion. He placed LifeVest and switched beta blocker to Toprol XL.   Called after hours service with swelling, instructed to continue lasix 20 mg bid.   Seen in ED 12/25/20 for fall. She was standing at work and became dizzy. She fell backwards and hit her head. CT neg, labs showed K 5.7. She was given 1 dose of Lokelma. She denied palpitations.   Volume overloaded at follow up 01/06/21, lasix switched to torsemide. She had a LifeVest per Cardiology at [facility].   Echo (10/22): EF  up to 45-50%, grade II DD, RV ok. LifeVest off.   Treated in ED 10/22 for pancreatitis, then admitted 11/22 for hyperglycemia, did not meet criteria for DKA. Hospitalization c/b hyponatremia, sodium 118. Concern for volume overload. Lasix restarted but SCr increased. Nephrology consulted and restarted gentle IVF and GDMT held for a couple of days. Recommendation for Tolvaptan, however patient declined and requested discharge with outpatient follow-up her MD at [facility]. Jardiance stopped at discharge, weight 129 lbs.    Seen back in ED a few days later with increased weight and abd distention. Given IV lasix in ED and torsemide increased to 40 bid.   Follow up 11/22, her breathing had improved but c/o abdominal swelling/bloating, not felt to be related to volume overload. Concern for possible gastroparesis. She was referred to GI. Abd CT was unremarkable. Ceruloplasmin level was also low, raising concern for possible Wilson's Disease. However had further w/u w/ [facility] and reports Wilson's disease was ruled out. Additional GI testing unremarkable.    Echo 12/22 EF 45%, RV normal.    Follow up 08/02/21, weight up 16 lbs, ReDs 27%. Arranged for RHC to better assess hemodynamics and see if abdominal symptoms related to right-sided HF due to high-output state or non-cardiac (see results below).    [place] 5/23 with elevated filling pressures and normal cardiac ouput. Mild to moderate mixed pulmonary hypertension with prominent v-waves in RA, suggestive of significant TR. Given IV lasix and metolazone 2.5 added weekly to diuretic regimen.    RA = 12  prominent V waves RV = 47/14 PA = 48/25 (37) PCW = 25 Fick cardiac output/index = 4.4/2.8 PVR =3.7 WU Ao sat = 99% PA sat = 62%, 62%   Post cath follow up, volume up, REDs 43%. Torsemide increased and weekly metolazone added.   Admitted 5/23 with AKI and hyperkalemia. Given IVF, insulin, calcium gluconate and Lokelma. Echo showed EF 45-50%, RV normal, mild-mod  MR, RVSP 30. Only mild TR. Restarted on torsemide 40 bid (lower dose) and Entresto 49/51 bid. Arranged paramedicine, discharged home 143 lbs.   Follow up 6/23, several med discrepancies. Labs showed serum glucose 531, AKI and hyperkalemia and advised eval in ED. Admitted, GDMT held and hydrated with IVF. Discharged home on previous regimen on GDMT, weight 148 lbs.  Follow up 09/24/21, felt poorly and fluid overloaded. Treated w/ Furoscix 80 mg daily x 4 days, w/ instruction to switch back to torsemide 40 mg bid thereafter and to return in 2 wks for f/u.   Seen 09/27/21 at [facility] for 2nd opinion, felt to be congested and recommended increasing torsemide to 60 mg bid and instructed f/u in 3 months.   Follow up 7/23, continued with bloating. Med discrepancies with her torsemide. Labs at visit c/w dehydration. Scr elevted 3.61 (up from 2.24), BUN 79. BNP 7.9. Entresto held until repeat labs.  Received Furoscix 8/4, 8/5, and 10/31/21.  Admitted 9/23 for volume overload in setting of RV failure and AKI. Required milrinone. Underwent Cardiomems implant  RHC 9/23  RA 15 PA 37/23 (28) PCWP 16 Fick CO/CI 4.2/2.6 Thermo CO/CI 3.7/2.3 PVR 4.3 PA sat 51%, 54% PAPi 0.93  Follows with Dr. Hulen Skains at [facility]. Had renal biopsy recently (12/31/21) and showed DM and HTN nephropathy. They discussed starting HD soon.   Has been followed by Paramedicine. Has been seen in HF clinic several times over the last few months. Multiple discrepancies with medications noted at visits and misses meds at times. Several AKIs after adjusting diuretics (including self adjustments). Has used Furoscix (poor response), metolazone and torsemide without perceived improvement in HF symptoms. Has had multiple admissions for vasculitis, pancreatitis, AKI, hyperglycemia, volume overload and electrolyte imbalances since. Has required inotropic support several times in the past.     Admitted 2/24 with DKA/hypertriglyceridemia, TG >5000.  Concern for pancreatitis. CTA/P without acute process, no pancreatitis. Started on insulin/D10 gtt. Treated w/ Macrobid for UTI. Was going to d/c 06/01/22 however experienced pleuritic/substernal chest pain with desats while ambulating, d/c held off. EKG stable and ultimately resolved. AHF team consulted for volume overload. Echo showed EF 30-35% (lower than past), RV mildly dilated, moderate-severe MR, severe TR, dilated IVC. Started on milrinone, diuresed with IV lasix. Drips weaned. Continued on torsemide 80 mg daily, off beta blocker. She was discharged home, weight 131 lbs. Seen back in ED 3 days later with N/V and abd pain. Received IVF and Zofran.  Today she returns for HF follow up, here with Jennifer [name] with paramedicine. Overall feeling fine. She has dyspnea with walking on flat ground, more so walking up and down inclines. Feeling palpitations and chest ache for a few days. Denies abnormal bleeding, dizziness, edema, or PND/Orthopnea. Appetite ok. No fever or chills. Weight at home 126 pounds. Taking all medications. Has seen genetic counselor for her HyperTG. Not urinating much, urine is clear yellow.  ROS: All systems reviewed and negative except as per HPI.   Past Medical History:  Diagnosis Date   Brain tumor ([place]) 03/29/1995   astrocytoma   CHF (congestive  heart failure) (HCC)    Cholesterosis    DM (diabetes mellitus) ([place]) 10/10/2018   Fatty liver    HTN (hypertension) 10/10/2018   Hypothyroidism 10/10/2018   Lipoprotein deficiency    Lung disease    longevity long term   Pancreatitis    Polycystic ovary syndrome    Current Outpatient Medications  Medication Sig Dispense Refill   albuterol (VENTOLIN HFA) 108 (90 Base) MCG/ACT inhaler Inhale 1-2 puffs into the lungs every 6 (six) hours as needed for wheezing or shortness of breath.     aspirin EC 81 MG EC tablet Take 1 tablet (81 mg total) by mouth daily. Swallow whole. 30 tablet 1   CREON 36000-114000 units CPEP capsule Take  36,000 Units by mouth 3 (three) times daily.     DULoxetine (CYMBALTA) 30 MG capsule Take 30 mg by mouth at bedtime.     ergocalciferol (VITAMIN D2) 1.25 MG (50000 UT) capsule Take 50,000 Units by mouth every 7 (seven) days.     Evolocumab (REPATHA) 140 MG/ML SOSY Inject 140 mg into the skin See admin instructions. Inject 140 mg subcutaneously every other Monday evening     fenofibrate 160 MG tablet Take 1 tablet (160 mg total) by mouth daily. 30 tablet 1   guaiFENesin-dextromethorphan (ROBITUSSIN DM) 100-10 MG/5ML syrup Take 10 mLs by mouth every 6 (six) hours as needed for cough. 118 mL 0   hydrALAZINE (APRESOLINE) 50 MG tablet Take 1 tablet (50 mg total) by mouth every 8 (eight) hours. 90 tablet 0   Insulin Pen Needle 32G X 4 MM MISC 1 Device by Does not apply route QID. For use with insulin pens 100 each 0   insulin regular human CONCENTRATED (HUMULIN R U-500 KWIKPEN) 500 UNIT/ML KwikPen Inject 120 Units into the skin 3 (three) times daily with meals.     ivabradine (CORLANOR) 7.5 MG TABS tablet Take 1 tablet (7.5 mg total) by mouth 2 (two) times daily with a meal. 60 tablet 6   levocetirizine (XYZAL) 5 MG tablet Take 5 mg by mouth daily.     levothyroxine (SYNTHROID) 125 MCG tablet Take 1 tablet (125 mcg total) by mouth daily at 6 (six) AM. 30 tablet 1   pantoprazole (PROTONIX) 40 MG tablet Take 40 mg by mouth daily.     pregabalin (LYRICA) 150 MG capsule Take 300 mg by mouth at bedtime.     prochlorperazine (COMPAZINE) 5 MG tablet Take 5 mg by mouth daily.     rosuvastatin (CRESTOR) 40 MG tablet Take 40 mg by mouth daily.     sildenafil (REVATIO) 20 MG tablet Take 1 tablet (20 mg total) by mouth 3 (three) times daily. 90 tablet 3   sodium bicarbonate 650 MG tablet Take 650 mg by mouth 2 (two) times daily.     spironolactone (ALDACTONE) 25 MG tablet Take 1 tablet (25 mg total) by mouth daily. 30 tablet 0   tretinoin (RETIN-A) 0.05 % cream Apply 1 application topically at bedtime.      nitrofurantoin, macrocrystal-monohydrate, (MACROBID) 100 MG capsule Take 1 capsule (100 mg total) by mouth every 12 (twelve) hours. 6 capsule 0   No current facility-administered medications for this encounter.   Allergies  Allergen Reactions   Ketamine Other (See Comments)    In vegetative state for 15 minutes per pt   Erythromycin     unk   Fentanyl Nausea And Vomiting   Maitake Mushroom [M[place]ake] Itching    Itchy throat   Penicillin V Itching  Gi upset   Shellfish Allergy Other (See Comments)    Pt has never had shellfish but tested positive on allergy test. Pt states contrast in CT is okay   Morphine Itching and Rash   Penicillins Rash    Has patient had a PCN reaction causing immediate rash, facial/tongue/throat swelling, SOB or lightheadedness with hypotension: Y Has patient had a PCN reaction causing severe rash involving mucus membranes or skin necrosis: Y Has patient had a PCN reaction that required hospitalization: N Has patient had a PCN reaction occurring within the last 10 years: Y If all of the above answers are "NO", then may proceed with Cephalosporin use.    Prednisone Rash   Social History   Socioeconomic History   Marital status: Divorced    Spouse name: Not on file   Number of children: 0   Years of education: Not on file   Highest education level: Not on file  Occupational History   Occupation: [employer]  Tobacco Use   Smoking status: Never   Smokeless tobacco: Never  Vaping Use   Vaping Use: Never used  Substance and Sexual Activity   Alcohol use: Never   Drug use: Never   Sexual activity: Yes  Other Topics Concern   Not on file  Social History Narrative   Not on file   Social Determinants of Health   Financial Resource Strain: High Risk (04/05/2022)   Overall Financial Resource Strain (CARDIA)    Difficulty of Paying Living Expenses: Hard  Food Insecurity: No Food Insecurity (06/15/2022)   Hunger Vital Sign    Worried About Running  Out of Food in the Last Year: Never true    Ran Out of Food in the Last Year: Never true  Transportation Needs: Unmet Transportation Needs (06/15/2022)   PRAPARE - Transportation    Lack of Transportation (Medical): Yes    Lack of Transportation (Non-Medical): Yes  Physical Activity: Sufficiently Active (12/10/2021)   Exercise Vital Sign    Days of Exercise per Week: 3 days    Minutes of Exercise per Session: 60 min  Stress: No Stress Concern Present (12/10/2021)   [place] of Stress : Not at all  Social Connections: [place] (12/10/2021)   Social Connection and Isolation Panel [NHANES]    Frequency of Communication with Friends and Family: More than three times a week    Frequency of Social Gatherings with Friends and Family: More than three times a week    Attends Religious Services: More than 4 times per year    Active Member of [employer] or Organizations: No    Attends Music therapist: More than 4 times per year    Marital Status: Married  Human resources officer Violence: Not At Risk (06/15/2022)   Humiliation, Afraid, Rape, and Kick questionnaire    Fear of Current or Ex-Partner: No    Emotionally Abused: No    Physically Abused: No    Sexually Abused: No   Family History  Problem Relation Age of Onset   Diabetes Mother    Hypertension Mother    Hyperlipidemia Mother    Thyroid disease Mother    Hypertension Father    Diabetes Father    Pancreatic cancer Paternal Aunt    Pancreatic cancer Paternal Uncle    Colon cancer Neg Hx    Esophageal cancer Neg Hx    Stomach cancer Neg Hx    BP (!) 144/90  Pulse (!) 120   Wt 59.9 kg (132 lb)   LMP 04/23/2022   SpO2 99%   BMI 28.56 kg/m   Wt Readings from Last 3 Encounters:  06/22/22 59.9 kg (132 lb)  06/15/22 58.5 kg (129 lb)  06/15/22 57.2 kg (126 lb)   PHYSICAL EXAM: General:  NAD. No resp difficulty HEENT: R eye  ptosis Neck: Supple. No JVD. Carotids 2+ bilat; no bruits. No lymphadenopathy or thryomegaly appreciated. Cor: PMI nondisplaced. Regular rate & rhythm. No rubs, gallops or murmurs. Lungs: Clear Abdomen: Soft, nontender, nondistended. No hepatosplenomegaly. No bruits or masses. Good bowel sounds. Extremities: No cyanosis, clubbing, rash, edema Neuro: Alert & oriented x 3, cranial nerves grossly intact. Moves all 4 extremities w/o difficulty. L arm contracted. Affect pleasant.  ECG (personally reviewed): ST 118 bpm, QTc 465 msec  Cardiomems (personally reviewed): PAD 14 06/21/22, goal 17  ASSESSMENT & PLAN: Nonischemic cardiomyopathy, recovered EF, previously with HFrEF (EF 29%) - Echo (5/23): EF 45-50%, RV normal  - Echo 9/23: EF 50-55%, RV normal. mild MR, mild-mod TR - Echo 3/24 EF 45-50% mild-mod TR, moderate MR - Echo this admission (06/10/22) with EF 30-35% (lower than past), RV mildly dilated, moderate-severe MR, severe TR. - NYHA III-IIIb, volume good today, Cardiomems 14, goal 17 - Increase Coreg to 6.25 mg bid - Continue torsemide 40 mg daily.  - Continue spiro 25 mg daily. - Continue hydralazine 50 mg tid.  - Continue ivabradine 7.5 mg bid.   - Off Jardiance due to frequent GU infections (+UTI this admission).  - Off Entresto with AKI.  - Labs today. - Repeat echo in 2-3 months   2. CAD - Single vessel RCA occlusion (small co-dominant vessel) on cardiac cath 09/08. - Medical management - No chest pain - Continue aspirin + statin + Repatha (awaiting insurance approval)  3.  CKD IV - Frequent AKIs - Scr baseline variable with multiple AKIs, typically 2.0-2.5 - Working with Dr. Hulen Skains (Nephrology at [facility]). Had renal biopsy recently (12/31/21) and showed DM and HTN nephropathy. Discussing timing of HD start. Has HD education starting next week. - Labs today   4. Pulmonary hypertension - Mild to moderate on RHC 5/23. PVR 3.7 WU. - RHC 12/06/21: RA 15, PA 37/23 (28), PCWP  16, Fick CO/CI 4.2/2.6, Thermo CO/CI 3.7/2.3, PVR 4.3, PA sat 51%, 54%, PAPi 0.93  - VQ negative - Continue sildenafil 20 mg tid.   5. TR - Prominent v-waves in RA tracing on RHC 5/23. - Echo 5/23 shows only mild TR  - Mild to mod on 9/23 echo - moderate on echo (3/24)   6. Severe hyperTG - Due to LPL deficiency w/ recent pancreatitis. - Continue fenofibrate  - Recent admission >5000>1430>1271 - CTA/P negative for pancreatitis or other acute findings  - Refer to Lipid Clinic for further management.   7. Type 3c DM - Follow[facility]/ [facility] at [facility]. - 2/2 chronic pancreatitis    8. Chronic abdominal swelling/bloating - Seen at [facility] (11/20) for IBS, per chart review. - Had penumatosis intestinalis. She was started on pancreatic enzyme replacement. - EGD (10/21): normal, bx taken to assess for celiac. - CT abdomen (2/22) and RUQ US showed no obvious gallbladder stones. - HIDA (4/22): ended early due to tachypnea, but cystic duct and gallbladder appeared WNL. - CT abdomen 12/22 unremarkable  - US abdomen 11/25/21: No ascites - suspect primarily fatty liver - Abd XR 12/27: mild gaseous distention   9. Poor Med Compliance/SDOH - Followed  by paramedicine, greatly appreciate their assistance. - She now has Medicaid, uses Medicaid transport.  Follow up in 6 weeks with APP and 3-4 months with Dr. Haroldine Laws.   Allena Katz, FNP-BC 06/22/22

## 2022-06-22 NOTE — Patient Instructions (Addendum)
Thank you for coming in today  Labs were done today, if any labs are abnormal the clinic will call you No news is good news  Medications: Increase Coreg 6.25 mg 1 tablet twice daily   Follow up appointments:  You have been referred to  Lipid clinic, their office will call you for further appointment details    Your physician recommends that you schedule a follow-up appointment in:  6 weeks in clinic   3-4 months with Dr. Haroldine Laws  You will receive a reminder letter in the mail a few months in advance. If you don't receive a letter, please call our office to schedule the follow-up appointment.    Do the following things EVERYDAY: Weigh yourself in the morning before breakfast. Write it down and keep it in a log. Take your medicines as prescribed Eat low salt foods--Limit salt (sodium) to 2000 mg per day.  Stay as active as you can everyday Limit all fluids for the day to less than 2 liters   At the [facility], you and your health needs are our priority. As part of our continuing mission to provide you with exceptional heart care, we have created designated Provider Care Teams. These Care Teams include your primary Cardiologist (physician) and Advanced Practice Providers (APPs- Physician Assistants and Nurse Practitioners) who all work together to provide you with the care you need, when you need it.   You may see any of the following providers on your designated Care Team at your next follow up: Dr Glori Bickers Dr Loralie Champagne Dr. Roxana Hires, NP Lyda Jester, [place] [facility] [place], [place] Forestine Na, NP Audry Riles, PharmD   Please be sure to bring in all your medications bottles to every appointment.    Thank you for choosing [facility]  If you have any questions or concerns before your next appointment please send [place] a message through [place] or call our office at  [phone].    TO LEAVE A MESSAGE FOR THE NURSE SELECT OPTION 2, PLEASE LEAVE A MESSAGE INCLUDING: YOUR NAME DATE OF BIRTH CALL BACK NUMBER REASON FOR CALL**this is important as we prioritize the call backs  YOU WILL RECEIVE A CALL BACK THE SAME DAY AS LONG AS YOU CALL BEFORE 4:00 PM

## 2022-06-23 ENCOUNTER — Other Ambulatory Visit (HOSPITAL_COMMUNITY): Payer: Self-pay

## 2022-06-23 NOTE — Progress Notes (Signed)
Came out for med rec, she was seen in clinic yesterday but didn't bring her meds or pill box.   Meds verified and pill box refilled.   Refills needed-  Levocetirizine-out of refills-was rx from her PCP--who is [facility]-I asked pt to contact them requesting refills be sent to pharm--[facility]  Sildenafil--got that called in and will add to her delivery today   Sodium bicarb-medicaid does not cover OTC-so she will have to get on her own    [name], [place] 06/23/2022

## 2022-06-27 DIAGNOSIS — Z419 Encounter for procedure for purposes other than remedying health state, unspecified: Secondary | ICD-10-CM | POA: Diagnosis not present

## 2022-06-29 DIAGNOSIS — E139 Other specified diabetes mellitus without complications: Secondary | ICD-10-CM | POA: Diagnosis not present

## 2022-06-30 ENCOUNTER — Other Ambulatory Visit (HOSPITAL_COMMUNITY): Payer: Self-pay

## 2022-06-30 NOTE — Progress Notes (Signed)
Paramedicine Encounter    Patient ID: Jennifer [name], female    DOB: October 04, 1990, 32 y.o.   MRN: [id number]   Complaints-continued c/p   Edema-none  Compliance with meds-?  Pill box filled-no already filled  If so, by whom-pt   Refills needed-sodium bicarb--needs from OTC-she will order from [facility]   She reports she has been having continued c/p. Sounds like it hurts with exertion, does get better with rest.  She reports get sob when she gets the c/p. [name] asking rx for ntg- I sent message to triage staff on this.   She reports she has been taking her meds but when I filled her pill box last week there was sildenafil in some sporadic spots and if she had taken her meds then she would have been out and the sildenafil would be gone b/c pharmacy just filled that and she missed the delivery. So I dont think she is taking her meds like she says.  She tried to say that maybe she missed that day but it was in almost every AM dose, 5 afternoon doses and several of the evening doses- so I asked her did she take the other meds but just left out the sildenafil? She then said maybe she missed some but reports taking it last night and this morning.   She had video appoint with endo and they are trying to come with plan to better control her CBG's.  HR elevated also she drank some caffeine today and not taken meds correctly.  EKG done- ST @ 130. Looks same as last EKG other than higher HR.  Advised her for rest of evening she needs to rest--she had been sitting the whole visit and I took the v/s at the end of visit and it was still elevated.  She said she had been cleaning today, moving things around and took shower prior to my arrival.    BP (!) 164/98   Pulse (!) 130   Resp 18   Wt 126 lb (57.2 kg)   LMP 04/23/2022   SpO2 98%   BMI 27.27 kg/m  Weight yesterday--126  Last visit weight-132 @ clinic   Patient Care Team: Elinor Parkinson as PCP - General (Physician Assistant) Jacelyn Pi, MD as PCP - Endocrinology (Endocrinology)  Patient Active Problem List   Diagnosis Date Noted   UTI (urinary tract infection) due to Enterococcus 06/01/2022   E. coli UTI (urinary tract infection) 06/01/2022   DKA (diabetic ketoacidosis) 06/01/2022   Pancreatitis 05/29/2022   Hypokalemia 03/23/2022   COVID 03/15/2022   COVID-19 virus infection 03/14/2022   Hyponatremia 03/14/2022   Positive D dimer 03/14/2022   Stage 3b chronic kidney disease (CKD) 03/14/2022   Pulmonary hypertension 02/06/2022   NASH (nonalcoholic steatohepatitis) 02/06/2022   Obesity (BMI 30-39.9) 02/06/2022   Heart failure 12/03/2021   Acute on chronic systolic CHF (congestive heart failure) 12/02/2021   Elevated troponin 12/02/2021   Pancytopenia 12/02/2021   Amenorrhea 12/02/2021   Hyperosmolar hyperglycemic state (HHS) 08/26/2021   Small intestinal bacterial overgrowth (SIBO) 08/26/2021   Lower extremity edema 08/26/2021   Hyperkalemia 08/14/2021   Hx of insulin dependent diabetes mellitus 08/14/2021   CKD (chronic kidney disease) stage 4, GFR 15-29 ml/min 05/13/2021   Chest pain 03/10/2021   Acute on chronic combined systolic and diastolic CHF (congestive heart failure) 02/09/2021   History of astrocytoma of brain 02/09/2021   Severe hyperglycemia due to diabetes mellitus 01/25/2021   Diarrhea  Elevated transaminase level    Acute combined systolic and diastolic heart failure    Nonischemic cardiomyopathy    Acute decompensated heart failure    Elevated liver enzymes    Acute renal failure superimposed on stage 3a chronic kidney disease 11/26/2020   Intractable abdominal pain 99991111   Chronic systolic CHF (congestive heart failure) 11/23/2020   Essential hypertension 11/23/2020   Hypertriglyceridemia 11/23/2020   Prolonged QT interval 11/23/2020   Type 2 diabetes mellitus with hyperlipidemia 10/10/2018   Hypothyroidism 10/10/2018    Current Outpatient Medications:    albuterol  (VENTOLIN HFA) 108 (90 Base) MCG/ACT inhaler, Inhale 1-2 puffs into the lungs every 6 (six) hours as needed for wheezing or shortness of breath., Disp: , Rfl:    aspirin EC 81 MG EC tablet, Take 1 tablet (81 mg total) by mouth daily. Swallow whole., Disp: 30 tablet, Rfl: 1   carvedilol (COREG) 6.25 MG tablet, Take 1 tablet (6.25 mg total) by mouth 2 (two) times daily., Disp: 180 tablet, Rfl: 3   CREON 36000-114000 units CPEP capsule, Take 36,000 Units by mouth 3 (three) times daily., Disp: , Rfl:    DULoxetine (CYMBALTA) 30 MG capsule, Take 30 mg by mouth at bedtime., Disp: , Rfl:    ergocalciferol (VITAMIN D2) 1.25 MG (50000 UT) capsule, Take 50,000 Units by mouth every 7 (seven) days., Disp: , Rfl:    Evolocumab (REPATHA) 140 MG/ML SOSY, Inject 140 mg into the skin See admin instructions. Inject 140 mg subcutaneously every other Monday evening, Disp: , Rfl:    fenofibrate 160 MG tablet, Take 1 tablet (160 mg total) by mouth daily., Disp: 30 tablet, Rfl: 1   guaiFENesin-dextromethorphan (ROBITUSSIN DM) 100-10 MG/5ML syrup, Take 10 mLs by mouth every 6 (six) hours as needed for cough. (Patient not taking: Reported on 06/23/2022), Disp: 118 mL, Rfl: 0   hydrALAZINE (APRESOLINE) 50 MG tablet, Take 1 tablet (50 mg total) by mouth every 8 (eight) hours., Disp: 90 tablet, Rfl: 0   Insulin Pen Needle 32G X 4 MM MISC, 1 Device by Does not apply route QID. For use with insulin pens, Disp: 100 each, Rfl: 0   insulin regular human CONCENTRATED (HUMULIN R U-500 KWIKPEN) 500 UNIT/ML KwikPen, Inject 120 Units into the skin 3 (three) times daily with meals., Disp: , Rfl:    ivabradine (CORLANOR) 7.5 MG TABS tablet, Take 1 tablet (7.5 mg total) by mouth 2 (two) times daily with a meal., Disp: 60 tablet, Rfl: 6   levocetirizine (XYZAL) 5 MG tablet, Take 5 mg by mouth daily., Disp: , Rfl:    levothyroxine (SYNTHROID) 125 MCG tablet, Take 1 tablet (125 mcg total) by mouth daily at 6 (six) AM., Disp: 30 tablet, Rfl: 1    nitrofurantoin, macrocrystal-monohydrate, (MACROBID) 100 MG capsule, Take 1 capsule (100 mg total) by mouth every 12 (twelve) hours. (Patient not taking: Reported on 06/23/2022), Disp: 6 capsule, Rfl: 0   pantoprazole (PROTONIX) 40 MG tablet, Take 40 mg by mouth daily., Disp: , Rfl:    pregabalin (LYRICA) 150 MG capsule, Take 300 mg by mouth at bedtime., Disp: , Rfl:    prochlorperazine (COMPAZINE) 5 MG tablet, Take 5 mg by mouth daily., Disp: , Rfl:    rosuvastatin (CRESTOR) 40 MG tablet, Take 40 mg by mouth daily., Disp: , Rfl:    sildenafil (REVATIO) 20 MG tablet, Take 1 tablet (20 mg total) by mouth 3 (three) times daily., Disp: 90 tablet, Rfl: 3   sodium bicarbonate 650 MG tablet,  Take 650 mg by mouth 2 (two) times daily., Disp: , Rfl:    spironolactone (ALDACTONE) 25 MG tablet, Take 1 tablet (25 mg total) by mouth daily., Disp: 30 tablet, Rfl: 0   torsemide (DEMADEX) 20 MG tablet, Take 40 mg by mouth daily., Disp: , Rfl:    tretinoin (RETIN-A) 0.05 % cream, Apply 1 application topically at bedtime., Disp: , Rfl:  Allergies  Allergen Reactions   Ketamine Other (See Comments)    In vegetative state for 15 minutes per pt   Erythromycin     unk   Fentanyl Nausea And Vomiting   Maitake Mushroom [Maitake] Itching    Itchy throat   Penicillin V Itching    Gi upset   Shellfish Allergy Other (See Comments)    Pt has never had shellfish but tested positive on allergy test. Pt states contrast in CT is okay   Morphine Itching and Rash   Penicillins Rash    Has patient had a PCN reaction causing immediate rash, facial/tongue/throat swelling, SOB or lightheadedness with hypotension: Y Has patient had a PCN reaction causing severe rash involving mucus membranes or skin necrosis: Y Has patient had a PCN reaction that required hospitalization: N Has patient had a PCN reaction occurring within the last 10 years: Y If all of the above answers are "NO", then may proceed with Cephalosporin use.     Prednisone Rash      Social History   Socioeconomic History   Marital status: Divorced    Spouse name: Not on file   Number of children: 0   Years of education: Not on file   Highest education level: Not on file  Occupational History   Occupation: [employer]  Tobacco Use   Smoking status: Never   Smokeless tobacco: Never  Vaping Use   Vaping Use: Never used  Substance and Sexual Activity   Alcohol use: Never   Drug use: Never   Sexual activity: Yes  Other Topics Concern   Not on file  Social History Narrative   Not on file   Social Determinants of Health   Financial Resource Strain: High Risk (04/05/2022)   Overall Financial Resource Strain (CARDIA)    Difficulty of Paying Living Expenses: Hard  Food Insecurity: No Food Insecurity (06/15/2022)   Hunger Vital Sign    Worried About Running Out of Food in the Last Year: Never true    Ran Out of Food in the Last Year: Never true  Transportation Needs: Unmet Transportation Needs (06/15/2022)   PRAPARE - Transportation    Lack of Transportation (Medical): Yes    Lack of Transportation (Non-Medical): Yes  Physical Activity: Sufficiently Active (12/10/2021)   Exercise Vital Sign    Days of Exercise per Week: 3 days    Minutes of Exercise per Session: 60 min  Stress: No Stress Concern Present (12/10/2021)   [place] of Stress : Not at all  Social Connections: [place] (12/10/2021)   Social Connection and Isolation Panel [NHANES]    Frequency of Communication with Friends and Family: More than three times a week    Frequency of Social Gatherings with Friends and Family: More than three times a week    Attends Religious Services: More than 4 times per year    Active Member of [employer] or Organizations: No    Attends Music therapist: More than 4 times per year    Marital Status:  Married  Intimate Partner Violence: Not At Risk  (06/15/2022)   Humiliation, Afraid, Rape, and Kick questionnaire    Fear of Current or Ex-Partner: No    Emotionally Abused: No    Physically Abused: No    Sexually Abused: No    Physical Exam      Future Appointments  Date Time Provider [place]  08/03/2022 10:30 AM [facility] PA/NP [facility] None       [name], Paramedic [place] Paramedic  06/30/22

## 2022-07-01 ENCOUNTER — Telehealth (HOSPITAL_COMMUNITY): Payer: Self-pay

## 2022-07-01 NOTE — Telephone Encounter (Signed)
I called patient to obtain further information about her chest pain and if she has previously had a prescription for nitroglycerin. I left a message for her to call back. Per Jessica's note, if she is having ongoing chest pain she should go to the ED.

## 2022-07-01 NOTE — Telephone Encounter (Signed)
-----   Message from Jacklynn Ganong, [place] sent at 07/01/2022  1:41 PM EDT ----- Regarding: RE: rx If she has CP she needs to go to the ED. Has she ever had nitro?   ----- Message ----- From: Odette Fraction, CMA Sent: 07/01/2022   8:26 AM EDT To: Jacklynn Ganong, FNP Subject: FW: rx                                          ----- Message ----- From: Kerry Hough, Paramedic Sent: 06/30/2022   5:49 PM EDT To: [facility] Triage Pool Subject: rx                                             Hey, she is c/o continued c/p. Sounds like it starts upon exertion.  Pain does get better with rest.   [name] asking for rx of NTG.     Kerry Hough, EMT-Paramedic  [phone] 06/30/2022

## 2022-07-04 DIAGNOSIS — H34832 Tributary (branch) retinal vein occlusion, left eye, with macular edema: Secondary | ICD-10-CM | POA: Diagnosis not present

## 2022-07-04 DIAGNOSIS — Z7689 Persons encountering health services in other specified circumstances: Secondary | ICD-10-CM | POA: Diagnosis not present

## 2022-07-04 DIAGNOSIS — H34811 Central retinal vein occlusion, right eye, with macular edema: Secondary | ICD-10-CM | POA: Diagnosis not present

## 2022-07-04 DIAGNOSIS — H3582 Retinal ischemia: Secondary | ICD-10-CM | POA: Diagnosis not present

## 2022-07-04 DIAGNOSIS — H47392 Other disorders of optic disc, left eye: Secondary | ICD-10-CM | POA: Diagnosis not present

## 2022-07-04 DIAGNOSIS — H35033 Hypertensive retinopathy, bilateral: Secondary | ICD-10-CM | POA: Diagnosis not present

## 2022-07-06 ENCOUNTER — Other Ambulatory Visit (HOSPITAL_COMMUNITY): Payer: Self-pay

## 2022-07-06 NOTE — Progress Notes (Signed)
Paramedicine Encounter    Patient ID: Jennifer [name], female    DOB: 09/17/1990, 32 y.o.   MRN: [id number]   Complaints-abd pain/upset from antibiotic   Edema-no  Compliance with meds-no ?? Pill box is empty, but she has been known to trash pills in the past   Pill box filled-yes If so, by whom-mparamedic   Refills needed- Corlanor Levocetirizine -has thru thurs-sun   Pt reports that she is doing better, she has abd upset from starting antibiotics again due to UTI/cyst in groin area.  She denies increased sob, no dizziness, c/p has resolved.  Appetite ok. Still struggling with her glucose--she is in touch with her endo doc for that.  Meds verified and pill box refilled.  It was empty today. Her HR is improved, last visit it was 130s and now it is 114.  B/p elevated-no meds yet this morning.  She feels bloated but she said she drank a large coffee this morning.     She has not used cardiomems since 3/26. Advised her to remember to use it.   Also prepped her for d/c soon since she has things needed a this time, she just has to do what is asked--take meds, use cardiomems. Stay on top of refills and communicate with pharmacy if they dont deliver like they are suppose to.  Will see her for another month or two and see how things go.   Also her c/p resolved-she felt that she over exerted herself.    BP (!) 160/90   Pulse (!) 114   Resp 18   Wt 124 lb (56.2 kg)   LMP 04/23/2022   SpO2 99%   BMI 26.83 kg/m  Weight yesterday-124 Last visit weight-132 @ clinic   Patient Care Team: Leonard Downing as PCP - General (Physician Assistant) Dorisann Frames, MD as PCP - Endocrinology (Endocrinology)  Patient Active Problem List   Diagnosis Date Noted   UTI (urinary tract infection) due to Enterococcus 06/01/2022   E. coli UTI (urinary tract infection) 06/01/2022   DKA (diabetic ketoacidosis) 06/01/2022   Pancreatitis 05/29/2022   Hypokalemia 03/23/2022   COVID  03/15/2022   COVID-19 virus infection 03/14/2022   Hyponatremia 03/14/2022   Positive D dimer 03/14/2022   Stage 3b chronic kidney disease (CKD) 03/14/2022   Pulmonary hypertension 02/06/2022   NASH (nonalcoholic steatohepatitis) 02/06/2022   Obesity (BMI 30-39.9) 02/06/2022   Heart failure 12/03/2021   Acute on chronic systolic CHF (congestive heart failure) 12/02/2021   Elevated troponin 12/02/2021   Pancytopenia 12/02/2021   Amenorrhea 12/02/2021   Hyperosmolar hyperglycemic state (HHS) 08/26/2021   Small intestinal bacterial overgrowth (SIBO) 08/26/2021   Lower extremity edema 08/26/2021   Hyperkalemia 08/14/2021   Hx of insulin dependent diabetes mellitus 08/14/2021   CKD (chronic kidney disease) stage 4, GFR 15-29 ml/min 05/13/2021   Chest pain 03/10/2021   Acute on chronic combined systolic and diastolic CHF (congestive heart failure) 02/09/2021   History of astrocytoma of brain 02/09/2021   Severe hyperglycemia due to diabetes mellitus 01/25/2021   Diarrhea    Elevated transaminase level    Acute combined systolic and diastolic heart failure    Nonischemic cardiomyopathy    Acute decompensated heart failure    Elevated liver enzymes    Acute renal failure superimposed on stage 3a chronic kidney disease 11/26/2020   Intractable abdominal pain 11/23/2020   Chronic systolic CHF (congestive heart failure) 11/23/2020   Essential hypertension 11/23/2020   Hypertriglyceridemia 11/23/2020   Prolonged QT  interval 11/23/2020   Type 2 diabetes mellitus with hyperlipidemia 10/10/2018   Hypothyroidism 10/10/2018    Current Outpatient Medications:    albuterol (VENTOLIN HFA) 108 (90 Base) MCG/ACT inhaler, Inhale 1-2 puffs into the lungs every 6 (six) hours as needed for wheezing or shortness of breath., Disp: , Rfl:    aspirin EC 81 MG EC tablet, Take 1 tablet (81 mg total) by mouth daily. Swallow whole., Disp: 30 tablet, Rfl: 1   carvedilol (COREG) 6.25 MG tablet, Take 1 tablet  (6.25 mg total) by mouth 2 (two) times daily., Disp: 180 tablet, Rfl: 3   clindamycin (CLEOCIN) 150 MG capsule, Take 450 mg by mouth 3 (three) times daily., Disp: , Rfl:    CREON 36000-114000 units CPEP capsule, Take 36,000 Units by mouth 3 (three) times daily., Disp: , Rfl:    DULoxetine (CYMBALTA) 30 MG capsule, Take 30 mg by mouth at bedtime., Disp: , Rfl:    ergocalciferol (VITAMIN D2) 1.25 MG (50000 UT) capsule, Take 50,000 Units by mouth every 7 (seven) days., Disp: , Rfl:    Evolocumab (REPATHA) 140 MG/ML SOSY, Inject 140 mg into the skin See admin instructions. Inject 140 mg subcutaneously every other Monday evening, Disp: , Rfl:    fenofibrate 160 MG tablet, Take 1 tablet (160 mg total) by mouth daily., Disp: 30 tablet, Rfl: 1   hydrALAZINE (APRESOLINE) 50 MG tablet, Take 1 tablet (50 mg total) by mouth every 8 (eight) hours., Disp: 90 tablet, Rfl: 0   Insulin Pen Needle 32G X 4 MM MISC, 1 Device by Does not apply route QID. For use with insulin pens, Disp: 100 each, Rfl: 0   insulin regular human CONCENTRATED (HUMULIN R U-500 KWIKPEN) 500 UNIT/ML KwikPen, Inject 120 Units into the skin 3 (three) times daily with meals., Disp: , Rfl:    ivabradine (CORLANOR) 7.5 MG TABS tablet, Take 1 tablet (7.5 mg total) by mouth 2 (two) times daily with a meal., Disp: 60 tablet, Rfl: 6   levocetirizine (XYZAL) 5 MG tablet, Take 5 mg by mouth daily., Disp: , Rfl:    levothyroxine (SYNTHROID) 125 MCG tablet, Take 1 tablet (125 mcg total) by mouth daily at 6 (six) AM., Disp: 30 tablet, Rfl: 1   pantoprazole (PROTONIX) 40 MG tablet, Take 40 mg by mouth daily., Disp: , Rfl:    pregabalin (LYRICA) 150 MG capsule, Take 300 mg by mouth at bedtime., Disp: , Rfl:    prochlorperazine (COMPAZINE) 5 MG tablet, Take 5 mg by mouth daily., Disp: , Rfl:    rosuvastatin (CRESTOR) 40 MG tablet, Take 40 mg by mouth daily., Disp: , Rfl:    sildenafil (REVATIO) 20 MG tablet, Take 1 tablet (20 mg total) by mouth 3 (three)  times daily., Disp: 90 tablet, Rfl: 3   sodium bicarbonate 650 MG tablet, Take 650 mg by mouth 2 (two) times daily., Disp: , Rfl:    spironolactone (ALDACTONE) 25 MG tablet, Take 1 tablet (25 mg total) by mouth daily., Disp: 30 tablet, Rfl: 0   torsemide (DEMADEX) 20 MG tablet, Take 40 mg by mouth daily., Disp: , Rfl:    tretinoin (RETIN-A) 0.05 % cream, Apply 1 application topically at bedtime., Disp: , Rfl:    guaiFENesin-dextromethorphan (ROBITUSSIN DM) 100-10 MG/5ML syrup, Take 10 mLs by mouth every 6 (six) hours as needed for cough. (Patient not taking: Reported on 06/23/2022), Disp: 118 mL, Rfl: 0   nitrofurantoin, macrocrystal-monohydrate, (MACROBID) 100 MG capsule, Take 1 capsule (100 mg total) by mouth  every 12 (twelve) hours. (Patient not taking: Reported on 06/23/2022), Disp: 6 capsule, Rfl: 0 Allergies  Allergen Reactions   Ketamine Other (See Comments)    In vegetative state for 15 minutes per pt   Erythromycin     unk   Fentanyl Nausea And Vomiting   Maitake Mushroom [[place]] Itching    Itchy throat   Penicillin V Itching    Gi upset   Shellfish Allergy Other (See Comments)    Pt has never had shellfish but tested positive on allergy test. Pt states contrast in CT is okay   Morphine Itching and Rash   Penicillins Rash    Has patient had a PCN reaction causing immediate rash, facial/tongue/throat swelling, SOB or lightheadedness with hypotension: Y Has patient had a PCN reaction causing severe rash involving mucus membranes or skin necrosis: Y Has patient had a PCN reaction that required hospitalization: N Has patient had a PCN reaction occurring within the last 10 years: Y If all of the above answers are "NO", then may proceed with Cephalosporin use.    Prednisone Rash      Social History   Socioeconomic History   Marital status: Divorced    Spouse name: Not on file   Number of children: 0   Years of education: Not on file   Highest education level: Not on file   Occupational History   Occupation: [employer]  Tobacco Use   Smoking status: Never   Smokeless tobacco: Never  Vaping Use   Vaping Use: Never used  Substance and Sexual Activity   Alcohol use: Never   Drug use: Never   Sexual activity: Yes  Other Topics Concern   Not on file  Social History Narrative   Not on file   Social Determinants of Health   Financial Resource Strain: High Risk (04/05/2022)   Overall Financial Resource Strain (CARDIA)    Difficulty of Paying Living Expenses: Hard  Food Insecurity: No Food Insecurity (06/15/2022)   Hunger Vital Sign    Worried About Running Out of Food in the Last Year: Never true    Ran Out of Food in the Last Year: Never true  Transportation Needs: Unmet Transportation Needs (06/15/2022)   PRAPARE - Transportation    Lack of Transportation (Medical): Yes    Lack of Transportation (Non-Medical): Yes  Physical Activity: Sufficiently Active (12/10/2021)   Exercise Vital Sign    Days of Exercise per Week: 3 days    Minutes of Exercise per Session: 60 min  Stress: No Stress Concern Present (12/10/2021)   [employer] of Occupational Health - Occupational Stress Questionnaire    Feeling of Stress : Not at all  Social Connections: Socially Integrated (12/10/2021)   Social Connection and Isolation Panel [NHANES]    Frequency of Communication with Friends and Family: More than three times a week    Frequency of Social Gatherings with Friends and Family: More than three times a week    Attends Religious Services: More than 4 times per year    Active Member of [employer] or Organizations: No    Attends Engineer, structural: More than 4 times per year    Marital Status: Married  Catering manager Violence: Not At Risk (06/15/2022)   Humiliation, Afraid, Rape, and Kick questionnaire    Fear of Current or Ex-Partner: No    Emotionally Abused: No    Physically Abused: No    Sexually Abused: No    Physical Exam  Future  Appointments  Date Time Provider Department Center  08/03/2022 10:30 AM [facility] PA/NP [facility] None       Kerry HoughKatie Jadarious Dobbins, Paramedic [phone] [facility]  07/06/22

## 2022-07-07 ENCOUNTER — Telehealth (HOSPITAL_COMMUNITY): Payer: Self-pay | Admitting: Licensed Clinical Social Worker

## 2022-07-07 DIAGNOSIS — Z7689 Persons encountering health services in other specified circumstances: Secondary | ICD-10-CM | POA: Diagnosis not present

## 2022-07-07 NOTE — Telephone Encounter (Signed)
HF Paramedicine Team Based Care Meeting  HF MD- NA  HF NP - [name] Clegg NP-C   [facility] HF Paramedicine  [name] Lynch [employer]  Dede Smith  [facility] admit within the last 30 days for heart failure?   Medications concerns? Noncompliant with medications and not honest about it.  Will throw pills away and say she took them.  Does not do cardiomems as she is supposed to- hasn't done it since 3/26 and is supposed to do it everyday.  Eligible for discharge? Think she is not going to improve with continued enrollment- will plan to DC soon.  Burna Sis, LCSW Clinical Social Worker [facility] Desk#: [phone] Cell#: [phone]

## 2022-07-12 DIAGNOSIS — D649 Anemia, unspecified: Secondary | ICD-10-CM | POA: Diagnosis not present

## 2022-07-12 DIAGNOSIS — R000 Tachycardia, unspecified: Secondary | ICD-10-CM | POA: Diagnosis not present

## 2022-07-12 DIAGNOSIS — I152 Hypertension secondary to endocrine disorders: Secondary | ICD-10-CM | POA: Diagnosis not present

## 2022-07-12 DIAGNOSIS — E1159 Type 2 diabetes mellitus with other circulatory complications: Secondary | ICD-10-CM | POA: Diagnosis not present

## 2022-07-12 DIAGNOSIS — D500 Iron deficiency anemia secondary to blood loss (chronic): Secondary | ICD-10-CM | POA: Diagnosis not present

## 2022-07-12 DIAGNOSIS — Z7689 Persons encountering health services in other specified circumstances: Secondary | ICD-10-CM | POA: Diagnosis not present

## 2022-07-14 ENCOUNTER — Other Ambulatory Visit (HOSPITAL_COMMUNITY): Payer: Self-pay

## 2022-07-14 NOTE — Progress Notes (Signed)
Paramedicine Encounter    Patient ID: Jennifer [name], female    DOB: 03/02/1991, 32 y.o.   MRN: [id number]   Complaints-Sunday-felt dizzy.low b/p  Edema-she reports abd distention   Compliance with meds-no--missed all week except 3 am doses-the rest she didn't take at all   Pill box filled-not needed If so, by whom-  Refills needed-none      Pt reports she had a rough day on Sunday where she felt dizzy all day. She took b/p here at it was around 90 and she just slept all day.  She doesn't check it daily, just only when she feels bad.  She reports her CBG was fine. She only took 3 AM doses of her meds this week-when asked about that she said it makes her feel bad and then she just dont like taking her meds.  She denies increased sob, her cardiomems was up one point, she hasn't used it since 3/26, but her weight is up 6 lbs from last week. Again--she has missed numerous doses of her meds.  She has not taken any meds this morning or afternoon--advised her to please take her torsemide at least.     BP (!) 158/82   Pulse (!) 112   Resp 16   Wt 130 lb (59 kg)   SpO2 98%   BMI 28.13 kg/m  Weight yesterday-130  Last visit weight-124  Patient Care Team: Leonard Downing as PCP - General (Physician Assistant) Dorisann Frames, MD as PCP - Endocrinology (Endocrinology)  Patient Active Problem List   Diagnosis Date Noted   UTI (urinary tract infection) due to Enterococcus 06/01/2022   E. coli UTI (urinary tract infection) 06/01/2022   DKA (diabetic ketoacidosis) 06/01/2022   Pancreatitis 05/29/2022   Hypokalemia 03/23/2022   COVID 03/15/2022   COVID-19 virus infection 03/14/2022   Hyponatremia 03/14/2022   Positive D dimer 03/14/2022   Stage 3b chronic kidney disease (CKD) 03/14/2022   Pulmonary hypertension 02/06/2022   NASH (nonalcoholic steatohepatitis) 02/06/2022   Obesity (BMI 30-39.9) 02/06/2022   Heart failure 12/03/2021   Acute on chronic systolic CHF  (congestive heart failure) 12/02/2021   Elevated troponin 12/02/2021   Pancytopenia 12/02/2021   Amenorrhea 12/02/2021   Hyperosmolar hyperglycemic state (HHS) 08/26/2021   Small intestinal bacterial overgrowth (SIBO) 08/26/2021   Lower extremity edema 08/26/2021   Hyperkalemia 08/14/2021   Hx of insulin dependent diabetes mellitus 08/14/2021   CKD (chronic kidney disease) stage 4, GFR 15-29 ml/min 05/13/2021   Chest pain 03/10/2021   Acute on chronic combined systolic and diastolic CHF (congestive heart failure) 02/09/2021   History of astrocytoma of brain 02/09/2021   Severe hyperglycemia due to diabetes mellitus 01/25/2021   Diarrhea    Elevated transaminase level    Acute combined systolic and diastolic heart failure    Nonischemic cardiomyopathy    Acute decompensated heart failure    Elevated liver enzymes    Acute renal failure superimposed on stage 3a chronic kidney disease 11/26/2020   Intractable abdominal pain 11/23/2020   Chronic systolic CHF (congestive heart failure) 11/23/2020   Essential hypertension 11/23/2020   Hypertriglyceridemia 11/23/2020   Prolonged QT interval 11/23/2020   Type 2 diabetes mellitus with hyperlipidemia 10/10/2018   Hypothyroidism 10/10/2018    Current Outpatient Medications:    albuterol (VENTOLIN HFA) 108 (90 Base) MCG/ACT inhaler, Inhale 1-2 puffs into the lungs every 6 (six) hours as needed for wheezing or shortness of breath., Disp: , Rfl:    aspirin EC 81 MG  EC tablet, Take 1 tablet (81 mg total) by mouth daily. Swallow whole., Disp: 30 tablet, Rfl: 1   carvedilol (COREG) 6.25 MG tablet, Take 1 tablet (6.25 mg total) by mouth 2 (two) times daily., Disp: 180 tablet, Rfl: 3   clindamycin (CLEOCIN) 150 MG capsule, Take 450 mg by mouth 3 (three) times daily., Disp: , Rfl:    CREON 36000-114000 units CPEP capsule, Take 36,000 Units by mouth 3 (three) times daily., Disp: , Rfl:    DULoxetine (CYMBALTA) 30 MG capsule, Take 30 mg by mouth at  bedtime., Disp: , Rfl:    ergocalciferol (VITAMIN D2) 1.25 MG (50000 UT) capsule, Take 50,000 Units by mouth every 7 (seven) days., Disp: , Rfl:    Evolocumab (REPATHA) 140 MG/ML SOSY, Inject 140 mg into the skin See admin instructions. Inject 140 mg subcutaneously every other Monday evening, Disp: , Rfl:    fenofibrate 160 MG tablet, Take 1 tablet (160 mg total) by mouth daily., Disp: 30 tablet, Rfl: 1   guaiFENesin-dextromethorphan (ROBITUSSIN DM) 100-10 MG/5ML syrup, Take 10 mLs by mouth every 6 (six) hours as needed for cough. (Patient not taking: Reported on 06/23/2022), Disp: 118 mL, Rfl: 0   hydrALAZINE (APRESOLINE) 50 MG tablet, Take 1 tablet (50 mg total) by mouth every 8 (eight) hours., Disp: 90 tablet, Rfl: 0   Insulin Pen Needle 32G X 4 MM MISC, 1 Device by Does not apply route QID. For use with insulin pens, Disp: 100 each, Rfl: 0   insulin regular human CONCENTRATED (HUMULIN R U-500 KWIKPEN) 500 UNIT/ML KwikPen, Inject 120 Units into the skin 3 (three) times daily with meals., Disp: , Rfl:    ivabradine (CORLANOR) 7.5 MG TABS tablet, Take 1 tablet (7.5 mg total) by mouth 2 (two) times daily with a meal., Disp: 60 tablet, Rfl: 6   levocetirizine (XYZAL) 5 MG tablet, Take 5 mg by mouth daily., Disp: , Rfl:    levothyroxine (SYNTHROID) 125 MCG tablet, Take 1 tablet (125 mcg total) by mouth daily at 6 (six) AM., Disp: 30 tablet, Rfl: 1   nitrofurantoin, macrocrystal-monohydrate, (MACROBID) 100 MG capsule, Take 1 capsule (100 mg total) by mouth every 12 (twelve) hours. (Patient not taking: Reported on 06/23/2022), Disp: 6 capsule, Rfl: 0   pantoprazole (PROTONIX) 40 MG tablet, Take 40 mg by mouth daily., Disp: , Rfl:    pregabalin (LYRICA) 150 MG capsule, Take 300 mg by mouth at bedtime., Disp: , Rfl:    prochlorperazine (COMPAZINE) 5 MG tablet, Take 5 mg by mouth daily., Disp: , Rfl:    rosuvastatin (CRESTOR) 40 MG tablet, Take 40 mg by mouth daily., Disp: , Rfl:    sildenafil (REVATIO) 20 MG  tablet, Take 1 tablet (20 mg total) by mouth 3 (three) times daily., Disp: 90 tablet, Rfl: 3   sodium bicarbonate 650 MG tablet, Take 650 mg by mouth 2 (two) times daily., Disp: , Rfl:    spironolactone (ALDACTONE) 25 MG tablet, Take 1 tablet (25 mg total) by mouth daily., Disp: 30 tablet, Rfl: 0   torsemide (DEMADEX) 20 MG tablet, Take 40 mg by mouth daily., Disp: , Rfl:    tretinoin (RETIN-A) 0.05 % cream, Apply 1 application topically at bedtime., Disp: , Rfl:  Allergies  Allergen Reactions   Ketamine Other (See Comments)    In vegetative state for 15 minutes per pt   Erythromycin     unk   Fentanyl Nausea And Vomiting   Maitake Mushroom [Maitake] Itching    Itchy  throat   Penicillin V Itching    Gi upset   Shellfish Allergy Other (See Comments)    Pt has never had shellfish but tested positive on allergy test. Pt states contrast in CT is okay   Morphine Itching and Rash   Penicillins Rash    Has patient had a PCN reaction causing immediate rash, facial/tongue/throat swelling, SOB or lightheadedness with hypotension: Y Has patient had a PCN reaction causing severe rash involving mucus membranes or skin necrosis: Y Has patient had a PCN reaction that required hospitalization: N Has patient had a PCN reaction occurring within the last 10 years: Y If all of the above answers are "NO", then may proceed with Cephalosporin use.    Prednisone Rash      Social History   Socioeconomic History   Marital status: Divorced    Spouse name: Not on file   Number of children: 0   Years of education: Not on file   Highest education level: Not on file  Occupational History   Occupation: [employer]  Tobacco Use   Smoking status: Never   Smokeless tobacco: Never  Vaping Use   Vaping Use: Never used  Substance and Sexual Activity   Alcohol use: Never   Drug use: Never   Sexual activity: Yes  Other Topics Concern   Not on file  Social History Narrative   Not on file   Social  Determinants of Health   Financial Resource Strain: High Risk (04/05/2022)   Overall Financial Resource Strain (CARDIA)    Difficulty of Paying Living Expenses: Hard  Food Insecurity: No Food Insecurity (06/15/2022)   Hunger Vital Sign    Worried About Running Out of Food in the Last Year: Never true    Ran Out of Food in the Last Year: Never true  Transportation Needs: Unmet Transportation Needs (06/15/2022)   PRAPARE - Transportation    Lack of Transportation (Medical): Yes    Lack of Transportation (Non-Medical): Yes  Physical Activity: Sufficiently Active (12/10/2021)   Exercise Vital Sign    Days of Exercise per Week: 3 days    Minutes of Exercise per Session: 60 min  Stress: No Stress Concern Present (12/10/2021)   [employer] of Occupational Health - Occupational Stress Questionnaire    Feeling of Stress : Not at all  Social Connections: Socially Integrated (12/10/2021)   Social Connection and Isolation Panel [NHANES]    Frequency of Communication with Friends and Family: More than three times a week    Frequency of Social Gatherings with Friends and Family: More than three times a week    Attends Religious Services: More than 4 times per year    Active Member of Clubs or Organizations: No    Attends Engineer, structural: More than 4 times per year    Marital Status: Married  Catering manager Violence: Not At Risk (06/15/2022)   Humiliation, Afraid, Rape, and Kick questionnaire    Fear of Current or Ex-Partner: No    Emotionally Abused: No    Physically Abused: No    Sexually Abused: No    Physical Exam      Future Appointments  Date Time Provider Department Center  08/03/2022 10:30 AM [facility] PA/NP [facility] None       Kerry Hough, Paramedic [phone] [facility] Paramedic  07/14/22

## 2022-07-18 DIAGNOSIS — J302 Other seasonal allergic rhinitis: Secondary | ICD-10-CM | POA: Diagnosis not present

## 2022-07-18 DIAGNOSIS — R300 Dysuria: Secondary | ICD-10-CM | POA: Diagnosis not present

## 2022-07-18 DIAGNOSIS — N184 Chronic kidney disease, stage 4 (severe): Secondary | ICD-10-CM | POA: Diagnosis not present

## 2022-07-20 ENCOUNTER — Other Ambulatory Visit (HOSPITAL_COMMUNITY): Payer: Self-pay

## 2022-07-20 NOTE — Progress Notes (Signed)
Paramedicine Encounter    Patient ID: Jennifer [name], female    DOB: June 11, 1990, 32 y.o.   MRN: [id number]   Complaints-UTI   Edema-NONE   Compliance with meds-??  Pill box filled-pt filled but I checked it--it was missing sildenafil in a few places and hydralazine in the AM -they hydralazine was corrected.  If so, by whom-pt--I checked it.  Refills needed-sildenafil-due Friday     Pt reports doing ok. She has UTI and she started her menstruation this month.  Her cardiomems is good today. She isnt using it daily but the last couple times it has been good.  She will get her bactrim delivered today and will start for the UTI.  Pt denies increased sob, no c/p or dizziness.  Her parents are in town. There was some tension between them.  Mom was asking some questions about txp for her to get things-I explained there was scat txp but pt spoke up and said she wasn't doing that.  Nothing more was expressed about the need for that.   Meds checked-corrected what was needed.   Will f/u next week.    BP 118/70   Pulse (!) 102   Resp 18   Wt 130 lb (59 kg)   SpO2 98%   BMI 28.13 kg/m  Weight yesterday-130 Last visit weight-130   Patient Care Team: Leonard Downing as PCP - General (Physician Assistant) Dorisann Frames, MD as PCP - Endocrinology (Endocrinology)  Patient Active Problem List   Diagnosis Date Noted   UTI (urinary tract infection) due to Enterococcus 06/01/2022   E. coli UTI (urinary tract infection) 06/01/2022   DKA (diabetic ketoacidosis) 06/01/2022   Pancreatitis 05/29/2022   Hypokalemia 03/23/2022   COVID 03/15/2022   COVID-19 virus infection 03/14/2022   Hyponatremia 03/14/2022   Positive D dimer 03/14/2022   Stage 3b chronic kidney disease (CKD) 03/14/2022   Pulmonary hypertension 02/06/2022   NASH (nonalcoholic steatohepatitis) 02/06/2022   Obesity (BMI 30-39.9) 02/06/2022   Heart failure 12/03/2021   Acute on chronic systolic CHF (congestive  heart failure) 12/02/2021   Elevated troponin 12/02/2021   Pancytopenia 12/02/2021   Amenorrhea 12/02/2021   Hyperosmolar hyperglycemic state (HHS) 08/26/2021   Small intestinal bacterial overgrowth (SIBO) 08/26/2021   Lower extremity edema 08/26/2021   Hyperkalemia 08/14/2021   Hx of insulin dependent diabetes mellitus 08/14/2021   CKD (chronic kidney disease) stage 4, GFR 15-29 ml/min 05/13/2021   Chest pain 03/10/2021   Acute on chronic combined systolic and diastolic CHF (congestive heart failure) 02/09/2021   History of astrocytoma of brain 02/09/2021   Severe hyperglycemia due to diabetes mellitus 01/25/2021   Diarrhea    Elevated transaminase level    Acute combined systolic and diastolic heart failure    Nonischemic cardiomyopathy    Acute decompensated heart failure    Elevated liver enzymes    Acute renal failure superimposed on stage 3a chronic kidney disease 11/26/2020   Intractable abdominal pain 11/23/2020   Chronic systolic CHF (congestive heart failure) 11/23/2020   Essential hypertension 11/23/2020   Hypertriglyceridemia 11/23/2020   Prolonged QT interval 11/23/2020   Type 2 diabetes mellitus with hyperlipidemia 10/10/2018   Hypothyroidism 10/10/2018    Current Outpatient Medications:    albuterol (VENTOLIN HFA) 108 (90 Base) MCG/ACT inhaler, Inhale 1-2 puffs into the lungs every 6 (six) hours as needed for wheezing or shortness of breath., Disp: , Rfl:    aspirin EC 81 MG EC tablet, Take 1 tablet (81 mg total) by  mouth daily. Swallow whole., Disp: 30 tablet, Rfl: 1   carvedilol (COREG) 6.25 MG tablet, Take 1 tablet (6.25 mg total) by mouth 2 (two) times daily., Disp: 180 tablet, Rfl: 3   clindamycin (CLEOCIN) 150 MG capsule, Take 450 mg by mouth 3 (three) times daily., Disp: , Rfl:    CREON 36000-114000 units CPEP capsule, Take 36,000 Units by mouth 3 (three) times daily., Disp: , Rfl:    DULoxetine (CYMBALTA) 30 MG capsule, Take 30 mg by mouth at bedtime., Disp:  , Rfl:    ergocalciferol (VITAMIN D2) 1.25 MG (50000 UT) capsule, Take 50,000 Units by mouth every 7 (seven) days., Disp: , Rfl:    Evolocumab (REPATHA) 140 MG/ML SOSY, Inject 140 mg into the skin See admin instructions. Inject 140 mg subcutaneously every other Monday evening, Disp: , Rfl:    fenofibrate 160 MG tablet, Take 1 tablet (160 mg total) by mouth daily., Disp: 30 tablet, Rfl: 1   guaiFENesin-dextromethorphan (ROBITUSSIN DM) 100-10 MG/5ML syrup, Take 10 mLs by mouth every 6 (six) hours as needed for cough., Disp: 118 mL, Rfl: 0   Insulin Pen Needle 32G X 4 MM MISC, 1 Device by Does not apply route QID. For use with insulin pens, Disp: 100 each, Rfl: 0   insulin regular human CONCENTRATED (HUMULIN R U-500 KWIKPEN) 500 UNIT/ML KwikPen, Inject 120 Units into the skin 3 (three) times daily with meals., Disp: , Rfl:    ivabradine (CORLANOR) 7.5 MG TABS tablet, Take 1 tablet (7.5 mg total) by mouth 2 (two) times daily with a meal., Disp: 60 tablet, Rfl: 6   levocetirizine (XYZAL) 5 MG tablet, Take 5 mg by mouth daily., Disp: , Rfl:    levothyroxine (SYNTHROID) 125 MCG tablet, Take 1 tablet (125 mcg total) by mouth daily at 6 (six) AM., Disp: 30 tablet, Rfl: 1   nitrofurantoin, macrocrystal-monohydrate, (MACROBID) 100 MG capsule, Take 1 capsule (100 mg total) by mouth every 12 (twelve) hours., Disp: 6 capsule, Rfl: 0   pantoprazole (PROTONIX) 40 MG tablet, Take 40 mg by mouth daily., Disp: , Rfl:    pregabalin (LYRICA) 150 MG capsule, Take 300 mg by mouth at bedtime., Disp: , Rfl:    prochlorperazine (COMPAZINE) 5 MG tablet, Take 5 mg by mouth daily., Disp: , Rfl:    rosuvastatin (CRESTOR) 40 MG tablet, Take 40 mg by mouth daily., Disp: , Rfl:    sildenafil (REVATIO) 20 MG tablet, Take 1 tablet (20 mg total) by mouth 3 (three) times daily., Disp: 90 tablet, Rfl: 3   sodium bicarbonate 650 MG tablet, Take 650 mg by mouth 2 (two) times daily., Disp: , Rfl:    torsemide (DEMADEX) 20 MG tablet, Take  40 mg by mouth daily., Disp: , Rfl:    tretinoin (RETIN-A) 0.05 % cream, Apply 1 application topically at bedtime., Disp: , Rfl:    hydrALAZINE (APRESOLINE) 50 MG tablet, Take 1 tablet (50 mg total) by mouth every 8 (eight) hours., Disp: 90 tablet, Rfl: 0   spironolactone (ALDACTONE) 25 MG tablet, Take 1 tablet (25 mg total) by mouth daily., Disp: 30 tablet, Rfl: 0 Allergies  Allergen Reactions   Ketamine Other (See Comments)    In vegetative state for 15 minutes per pt   Erythromycin     unk   Fentanyl Nausea And Vomiting   Maitake Mushroom [Maitake] Itching    Itchy throat   Penicillin V Itching    Gi upset   Shellfish Allergy Other (See Comments)  Pt has never had shellfish but tested positive on allergy test. Pt states contrast in CT is okay   Morphine Itching and Rash   Penicillins Rash    Has patient had a PCN reaction causing immediate rash, facial/tongue/throat swelling, SOB or lightheadedness with hypotension: Y Has patient had a PCN reaction causing severe rash involving mucus membranes or skin necrosis: Y Has patient had a PCN reaction that required hospitalization: N Has patient had a PCN reaction occurring within the last 10 years: Y If all of the above answers are "NO", then may proceed with Cephalosporin use.    Prednisone Rash      Social History   Socioeconomic History   Marital status: Divorced    Spouse name: Not on file   Number of children: 0   Years of education: Not on file   Highest education level: Not on file  Occupational History   Occupation: [employer]  Tobacco Use   Smoking status: Never   Smokeless tobacco: Never  Vaping Use   Vaping Use: Never used  Substance and Sexual Activity   Alcohol use: Never   Drug use: Never   Sexual activity: Yes  Other Topics Concern   Not on file  Social History Narrative   Not on file   Social Determinants of Health   Financial Resource Strain: High Risk (04/05/2022)   Overall Financial Resource  Strain (CARDIA)    Difficulty of Paying Living Expenses: Hard  Food Insecurity: No Food Insecurity (06/15/2022)   Hunger Vital Sign    Worried About Running Out of Food in the Last Year: Never true    Ran Out of Food in the Last Year: Never true  Transportation Needs: Unmet Transportation Needs (06/15/2022)   PRAPARE - Transportation    Lack of Transportation (Medical): Yes    Lack of Transportation (Non-Medical): Yes  Physical Activity: Sufficiently Active (12/10/2021)   Exercise Vital Sign    Days of Exercise per Week: 3 days    Minutes of Exercise per Session: 60 min  Stress: No Stress Concern Present (12/10/2021)   [employer] of Occupational Health - Occupational Stress Questionnaire    Feeling of Stress : Not at all  Social Connections: Socially Integrated (12/10/2021)   Social Connection and Isolation Panel [NHANES]    Frequency of Communication with Friends and Family: More than three times a week    Frequency of Social Gatherings with Friends and Family: More than three times a week    Attends Religious Services: More than 4 times per year    Active Member of Clubs or Organizations: No    Attends Engineer, structural: More than 4 times per year    Marital Status: Married  Catering manager Violence: Not At Risk (06/15/2022)   Humiliation, Afraid, Rape, and Kick questionnaire    Fear of Current or Ex-Partner: No    Emotionally Abused: No    Physically Abused: No    Sexually Abused: No    Physical Exam      Future Appointments  Date Time Provider Department Center  08/03/2022 10:30 AM [facility] PA/NP [facility] None       Kerry Hough, Paramedic [phone] [facility] Paramedic  07/21/22

## 2022-07-21 DIAGNOSIS — E781 Pure hyperglyceridemia: Secondary | ICD-10-CM | POA: Diagnosis not present

## 2022-07-27 DIAGNOSIS — Z419 Encounter for procedure for purposes other than remedying health state, unspecified: Secondary | ICD-10-CM | POA: Diagnosis not present

## 2022-07-28 DIAGNOSIS — R300 Dysuria: Secondary | ICD-10-CM | POA: Diagnosis not present

## 2022-07-28 DIAGNOSIS — Z7689 Persons encountering health services in other specified circumstances: Secondary | ICD-10-CM | POA: Diagnosis not present

## 2022-08-01 DIAGNOSIS — E139 Other specified diabetes mellitus without complications: Secondary | ICD-10-CM | POA: Diagnosis not present

## 2022-08-01 DIAGNOSIS — E781 Pure hyperglyceridemia: Secondary | ICD-10-CM | POA: Diagnosis not present

## 2022-08-01 DIAGNOSIS — E039 Hypothyroidism, unspecified: Secondary | ICD-10-CM | POA: Diagnosis not present

## 2022-08-01 DIAGNOSIS — N3000 Acute cystitis without hematuria: Secondary | ICD-10-CM | POA: Diagnosis not present

## 2022-08-03 ENCOUNTER — Encounter (HOSPITAL_BASED_OUTPATIENT_CLINIC_OR_DEPARTMENT_OTHER): Payer: Self-pay | Admitting: Emergency Medicine

## 2022-08-03 ENCOUNTER — Encounter (HOSPITAL_COMMUNITY): Payer: Self-pay

## 2022-08-03 ENCOUNTER — Telehealth (HOSPITAL_COMMUNITY): Payer: Self-pay

## 2022-08-03 ENCOUNTER — Other Ambulatory Visit (HOSPITAL_COMMUNITY): Payer: Self-pay

## 2022-08-03 ENCOUNTER — Other Ambulatory Visit: Payer: Self-pay

## 2022-08-03 ENCOUNTER — Emergency Department (HOSPITAL_BASED_OUTPATIENT_CLINIC_OR_DEPARTMENT_OTHER)
Admission: EM | Admit: 2022-08-03 | Discharge: 2022-08-03 | Disposition: A | Discharge status: Home / Self Care | Payer: Medicaid Other | Attending: Emergency Medicine | Admitting: Emergency Medicine

## 2022-08-03 ENCOUNTER — Ambulatory Visit (HOSPITAL_COMMUNITY)
Admission: RE | Admit: 2022-08-03 | Discharge: 2022-08-03 | Disposition: A | Discharge status: Home / Self Care | Payer: Medicaid Other | Source: Ambulatory Visit | Attending: Family Medicine | Admitting: Family Medicine

## 2022-08-03 VITALS — BP 148/100 | HR 112 | Wt 133.6 lb

## 2022-08-03 DIAGNOSIS — Z91148 Patient's other noncompliance with medication regimen for other reason: Secondary | ICD-10-CM | POA: Insufficient documentation

## 2022-08-03 DIAGNOSIS — E1122 Type 2 diabetes mellitus with diabetic chronic kidney disease: Secondary | ICD-10-CM | POA: Insufficient documentation

## 2022-08-03 DIAGNOSIS — I2510 Atherosclerotic heart disease of native coronary artery without angina pectoris: Secondary | ICD-10-CM | POA: Insufficient documentation

## 2022-08-03 DIAGNOSIS — N184 Chronic kidney disease, stage 4 (severe): Secondary | ICD-10-CM | POA: Insufficient documentation

## 2022-08-03 DIAGNOSIS — Z7984 Long term (current) use of oral hypoglycemic drugs: Secondary | ICD-10-CM | POA: Diagnosis not present

## 2022-08-03 DIAGNOSIS — R000 Tachycardia, unspecified: Secondary | ICD-10-CM | POA: Diagnosis not present

## 2022-08-03 DIAGNOSIS — Z7982 Long term (current) use of aspirin: Secondary | ICD-10-CM | POA: Diagnosis not present

## 2022-08-03 DIAGNOSIS — E119 Type 2 diabetes mellitus without complications: Secondary | ICD-10-CM | POA: Insufficient documentation

## 2022-08-03 DIAGNOSIS — E138 Other specified diabetes mellitus with unspecified complications: Secondary | ICD-10-CM | POA: Diagnosis not present

## 2022-08-03 DIAGNOSIS — Z5982 Transportation insecurity: Secondary | ICD-10-CM | POA: Diagnosis not present

## 2022-08-03 DIAGNOSIS — Z139 Encounter for screening, unspecified: Secondary | ICD-10-CM | POA: Diagnosis not present

## 2022-08-03 DIAGNOSIS — I272 Pulmonary hypertension, unspecified: Secondary | ICD-10-CM | POA: Diagnosis not present

## 2022-08-03 DIAGNOSIS — Z7689 Persons encountering health services in other specified circumstances: Secondary | ICD-10-CM | POA: Diagnosis not present

## 2022-08-03 DIAGNOSIS — Q228 Other congenital malformations of tricuspid valve: Secondary | ICD-10-CM | POA: Diagnosis not present

## 2022-08-03 DIAGNOSIS — R531 Weakness: Secondary | ICD-10-CM | POA: Diagnosis present

## 2022-08-03 DIAGNOSIS — K861 Other chronic pancreatitis: Secondary | ICD-10-CM | POA: Insufficient documentation

## 2022-08-03 DIAGNOSIS — I428 Other cardiomyopathies: Secondary | ICD-10-CM | POA: Insufficient documentation

## 2022-08-03 DIAGNOSIS — Z79899 Other long term (current) drug therapy: Secondary | ICD-10-CM | POA: Insufficient documentation

## 2022-08-03 DIAGNOSIS — Z8249 Family history of ischemic heart disease and other diseases of the circulatory system: Secondary | ICD-10-CM | POA: Diagnosis not present

## 2022-08-03 DIAGNOSIS — Z833 Family history of diabetes mellitus: Secondary | ICD-10-CM | POA: Diagnosis not present

## 2022-08-03 DIAGNOSIS — E039 Hypothyroidism, unspecified: Secondary | ICD-10-CM | POA: Insufficient documentation

## 2022-08-03 DIAGNOSIS — R1900 Intra-abdominal and pelvic swelling, mass and lump, unspecified site: Secondary | ICD-10-CM | POA: Diagnosis not present

## 2022-08-03 DIAGNOSIS — I509 Heart failure, unspecified: Secondary | ICD-10-CM

## 2022-08-03 DIAGNOSIS — Z794 Long term (current) use of insulin: Secondary | ICD-10-CM | POA: Insufficient documentation

## 2022-08-03 DIAGNOSIS — Z5986 Financial insecurity: Secondary | ICD-10-CM | POA: Insufficient documentation

## 2022-08-03 DIAGNOSIS — E781 Pure hyperglyceridemia: Secondary | ICD-10-CM

## 2022-08-03 DIAGNOSIS — I5022 Chronic systolic (congestive) heart failure: Secondary | ICD-10-CM | POA: Insufficient documentation

## 2022-08-03 DIAGNOSIS — I130 Hypertensive heart and chronic kidney disease with heart failure and stage 1 through stage 4 chronic kidney disease, or unspecified chronic kidney disease: Secondary | ICD-10-CM | POA: Diagnosis not present

## 2022-08-03 DIAGNOSIS — I110 Hypertensive heart disease with heart failure: Secondary | ICD-10-CM | POA: Insufficient documentation

## 2022-08-03 LAB — CBC WITH DIFFERENTIAL/PLATELET
Abs Immature Granulocytes: 0.06 10*3/uL (ref 0.00–0.07)
Basophils Absolute: 0.1 10*3/uL (ref 0.0–0.1)
Basophils Relative: 1 %
Eosinophils Absolute: 0.1 10*3/uL (ref 0.0–0.5)
Eosinophils Relative: 2 %
HCT: 29.1 % — ABNORMAL LOW (ref 36.0–46.0)
Hemoglobin: 10.6 g/dL — ABNORMAL LOW (ref 12.0–15.0)
Immature Granulocytes: 1 %
Lymphocytes Relative: 25 %
Lymphs Abs: 2.1 10*3/uL (ref 0.7–4.0)
MCH: 32.3 pg (ref 26.0–34.0)
MCHC: 36.4 g/dL — ABNORMAL HIGH (ref 30.0–36.0)
MCV: 88.7 fL (ref 80.0–100.0)
Monocytes Absolute: 0.4 10*3/uL (ref 0.1–1.0)
Monocytes Relative: 5 %
Neutro Abs: 5.4 10*3/uL (ref 1.7–7.7)
Neutrophils Relative %: 66 %
Platelets: 237 10*3/uL (ref 150–400)
RBC: 3.28 MIL/uL — ABNORMAL LOW (ref 3.87–5.11)
RDW: 16.7 % — ABNORMAL HIGH (ref 11.5–15.5)
WBC: 8.1 10*3/uL (ref 4.0–10.5)
nRBC: 0 % (ref 0.0–0.2)

## 2022-08-03 LAB — I-STAT VENOUS BLOOD GAS, ED
Acid-base deficit: 6 mmol/L — ABNORMAL HIGH (ref 0.0–2.0)
Bicarbonate: 19.7 mmol/L — ABNORMAL LOW (ref 20.0–28.0)
Calcium, Ion: 1.35 mmol/L (ref 1.15–1.40)
HCT: 32 % — ABNORMAL LOW (ref 36.0–46.0)
Hemoglobin: 10.9 g/dL — ABNORMAL LOW (ref 12.0–15.0)
O2 Saturation: 81 %
Patient temperature: 98
Potassium: 4.6 mmol/L (ref 3.5–5.1)
Sodium: 133 mmol/L — ABNORMAL LOW (ref 135–145)
TCO2: 21 mmol/L — ABNORMAL LOW (ref 22–32)
pCO2, Ven: 39.4 mmHg — ABNORMAL LOW (ref 44–60)
pH, Ven: 7.306 (ref 7.25–7.43)
pO2, Ven: 48 mmHg — ABNORMAL HIGH (ref 32–45)

## 2022-08-03 LAB — BASIC METABOLIC PANEL
Anion gap: 15 (ref 5–15)
BUN: 52 mg/dL — ABNORMAL HIGH (ref 6–20)
CO2: 16 mmol/L — ABNORMAL LOW (ref 22–32)
Calcium: 10 mg/dL (ref 8.9–10.3)
Chloride: 101 mmol/L (ref 98–111)
Creatinine, Ser: 1.6 mg/dL — ABNORMAL HIGH (ref 0.44–1.00)
GFR, Estimated: 44 mL/min — ABNORMAL LOW (ref >60)
Glucose, Bld: 185 mg/dL — ABNORMAL HIGH (ref 70–99)
Potassium: >7.5 mmol/L (ref 3.5–5.1)
Sodium: 132 mmol/L — ABNORMAL LOW (ref 135–145)

## 2022-08-03 MED ORDER — SODIUM ZIRCONIUM CYCLOSILICATE 10 G PO PACK
10.0000 g | PACK | Freq: Once | ORAL | Status: AC
  Administered 2022-08-03: 10 g via ORAL
  Filled 2022-08-03: qty 1

## 2022-08-03 NOTE — Telephone Encounter (Signed)
I spoke to patient and instructed her to go to ED. [name] heading there now. She will not take any meds until seen in ED and labs repeated.

## 2022-08-03 NOTE — Patient Instructions (Signed)
It was great to see you today! No medication changes are needed at this time.  Labs today We will only contact you if something comes back abnormal or we need to make some changes. Otherwise no news is good news!  Your physician recommends that you schedule a follow-up appointment in: 2-3 months with Dr Jennifer [name] and echo  Your physician has requested that you have an echocardiogram. Echocardiography is a painless test that uses sound waves to create images of your heart. It provides your doctor with information about the size and shape of your heart and how well your heart's chambers and valves are working. This procedure takes approximately one hour. There are no restrictions for this procedure. Please do NOT wear cologne, perfume, aftershave, or lotions (deodorant is allowed). Please arrive 15 minutes prior to your appointment time.  Do the following things EVERYDAY: Weigh yourself in the morning before breakfast. Write it down and keep it in a log. Take your medicines as prescribed Eat low salt foods--Limit salt (sodium) to 2000 mg per day.  Stay as active as you can everyday Limit all fluids for the day to less than 2 liters  At the [facility], you and your health needs are our priority. As part of our continuing mission to provide you with exceptional heart care, we have created designated Provider Care Teams. These Care Teams include your primary Cardiologist (physician) and Advanced Practice Providers (APPs- Physician Assistants and Nurse Practitioners) who all work together to provide you with the care you need, when you need it.   You may see any of the following providers on your designated Care Team at your next follow up: Dr Arvilla Meres Dr Marca Ancona Dr. Marcos Eke, NP Robbie Lis, [place] [facility] [place], [place] Brynda Peon, NP Karle Plumber, PharmD   Please be sure to bring in all your medications bottles to every  appointment.    Thank you for choosing [facility]

## 2022-08-03 NOTE — ED Triage Notes (Signed)
Pt arrives to ED with c/o hyperkalemia. She notes her MD sent her to the ED for a K level greater than 7,5 which was drawn this morning. She notes that she is a difficult to get blood on.

## 2022-08-03 NOTE — Telephone Encounter (Signed)
Lab called - specimen highly lipemic, which caused some hemolyzing - but her potassium shows greater than 7.5. They are releasing labs, but she will need further testing.

## 2022-08-03 NOTE — Discharge Instructions (Signed)
You were sent for elevated potassium. Gratefully, your potassium is normal here. Recommend continuing your regular medications as recommended by your Cardiologist and regular doctors.

## 2022-08-03 NOTE — ED Provider Notes (Signed)
[place] EMERGENCY DEPARTMENT AT [facility] Provider Note   CSN: [id number] Arrival date & time: 08/03/22  1737     History {Add pertinent medical, surgical, social history, OB history to HPI:1} Chief Complaint  Patient presents with   Abnormal Lab    Jennifer [name] is a 32 y.o. female.  HPI     Home Medications Prior to Admission medications   Medication Sig Start Date End Date Taking? Authorizing Provider  albuterol (VENTOLIN HFA) 108 (90 Base) MCG/ACT inhaler Inhale 1-2 puffs into the lungs every 6 (six) hours as needed for wheezing or shortness of breath. 12/31/18   [provider]  aspirin EC 81 MG EC tablet Take 1 tablet (81 mg total) by mouth daily. Swallow whole. 12/07/20   Pennie Banter, DO  carvedilol (COREG) 6.25 MG tablet Take 1 tablet (6.25 mg total) by mouth 2 (two) times daily. 06/22/22   Jacklynn Ganong, FNP  CREON [phone] units CPEP capsule Take 36,000 Units by mouth 3 (three) times daily.    [provider]  DULoxetine (CYMBALTA) 30 MG capsule Take 30 mg by mouth at bedtime. 01/28/19 01/27/23  [provider]  ergocalciferol (VITAMIN D2) 1.25 MG (50000 UT) capsule Take 50,000 Units by mouth every 7 (seven) days.    [provider]  Evolocumab (REPATHA) 140 MG/ML SOSY Inject 140 mg into the skin See admin instructions. Inject 140 mg subcutaneously every other Monday evening    [provider]  fenofibrate 160 MG tablet Take 1 tablet (160 mg total) by mouth daily. 12/07/20   Pennie Banter, DO  glipiZIDE (GLUCOTROL) 5 MG tablet Take 5 mg by mouth daily before breakfast.    [provider]  hydrALAZINE (APRESOLINE) 50 MG tablet Take 1 tablet (50 mg total) by mouth every 8 (eight) hours. 06/12/22 08/03/23  Hughie Closs, MD  Insulin Pen Needle 32G X 4 MM MISC 1 Device by Does not apply route QID. For use with insulin pens 02/13/22   [name], MD  insulin regular human CONCENTRATED  (HUMULIN R U-500 KWIKPEN) 500 UNIT/ML KwikPen Inject 120 Units into the skin 3 (three) times daily with meals. 06/01/22   Simonne Martinet, NP  ivabradine (CORLANOR) 7.5 MG TABS tablet Take 1 tablet (7.5 mg total) by mouth 2 (two) times daily with a meal. 08/02/21   [name], [name], MD  levocetirizine (XYZAL) 5 MG tablet Take 5 mg by mouth daily. 03/05/22   [provider]  levothyroxine (SYNTHROID) 125 MCG tablet Take 1 tablet (125 mcg total) by mouth daily at 6 (six) AM. 12/07/20   [name], DO  pantoprazole (PROTONIX) 40 MG tablet Take 40 mg by mouth daily. 12/24/19   [provider]  pregabalin (LYRICA) 150 MG capsule Take 300 mg by mouth at bedtime.    [provider]  prochlorperazine (COMPAZINE) 5 MG tablet Take 5 mg by mouth daily.    [provider]  rosuvastatin (CRESTOR) 40 MG tablet Take 40 mg by mouth daily. 08/09/21   [provider]  sildenafil (REVATIO) 20 MG tablet Take 1 tablet (20 mg total) by mouth 3 (three) times daily. 05/11/22   [name], [name], MD  sodium bicarbonate 650 MG tablet Take 650 mg by mouth 2 (two) times daily.    [provider]  spironolactone (ALDACTONE) 25 MG tablet Take 1 tablet (25 mg total) by mouth daily. 06/13/22 08/03/23  Hughie Closs, MD  torsemide (DEMADEX) 20 MG tablet Take  40 mg by mouth daily.    [provider]  tretinoin (RETIN-A) 0.05 % cream Apply 1 application topically at bedtime. 01/17/21   [provider]      Allergies    Ketamine, Erythromycin, Fentanyl, M[place]ake mushroom [maitake], Penicillin v, Shellfish allergy, Morphine, Penicillins, and Prednisone    Review of Systems   Review of Systems  Physical Exam Updated Vital Signs BP (!) 144/87 (BP Location: Right Arm)   Pulse (!) 116   Temp 98 F (36.7 C)   Resp 16   SpO2 95%  Physical Exam  ED Results / Procedures / Treatments   Labs (all labs ordered are listed, but only abnormal results are  displayed) Labs Reviewed  CBC WITH DIFFERENTIAL/PLATELET - Abnormal; Notable for the following components:      Result Value   RBC 3.28 (*)    Hemoglobin 10.6 (*)    HCT 29.1 (*)    MCHC 36.4 (*)    RDW 16.7 (*)    All other components within normal limits  I-STAT VENOUS BLOOD GAS, ED - Abnormal; Notable for the following components:   pCO2, Ven 39.4 (*)    pO2, Ven 48 (*)    Bicarbonate 19.7 (*)    TCO2 21 (*)    Acid-base deficit 6.0 (*)    Sodium 133 (*)    HCT 32.0 (*)    Hemoglobin 10.9 (*)    All other components within normal limits  COMPREHENSIVE METABOLIC PANEL  BETA-HYDROXYBUTYRIC ACID    EKG None  Radiology No results found.  Procedures Procedures  {Document cardiac monitor, telemetry assessment procedure when appropriate:1}  Medications Ordered in ED Medications  sodium zirconium cyclosilicate (LOKELMA) packet 10 g (10 g Oral Given 08/03/22 1801)    ED Course/ Medical Decision Making/ A&P   {   Click here for ABCD2, HEART and other calculatorsREFRESH Note before signing :1}                          Medical Decision Making Amount and/or Complexity of Data Reviewed Labs: ordered.  Risk Prescription drug management.   ***  {Document critical care time when appropriate:1} {Document review of labs and clinical decision tools ie heart score, Chads2Vasc2 etc:1}  {Document your independent review of radiology images, and any outside records:1} {Document your discussion with family members, caretakers, and with consultants:1} {Document social determinants of health affecting pt's care:1} {Document your decision making why or why not admission, treatments were needed:1} Final Clinical Impression(s) / ED Diagnoses Final diagnoses:  None    Rx / DC Orders ED Discharge Orders     None

## 2022-08-03 NOTE — Progress Notes (Signed)
Paramedicine Encounter   Patient ID: Jennifer [name] , female,   DOB: December 02, 1990,32 y.o.,  MRN: [id number]   Met patient in clinic today with provider.  Weight @ clinic-133 B/P-148/100 P-112 [id number]  Med changes-none -she needs to be complaint with her meds now-as this has been spke about numerous times.   Meds checked   She had filled pill box up but she said they are all are not correct.  I checked it- she is missing corlanor in one and sildenafil in some.  She just got it filled on 4/24 and she can't find the bottle.  Will f/u next week.    Kerry Hough, EMT-Paramedic [phone] 08/03/2022

## 2022-08-03 NOTE — Telephone Encounter (Signed)
Hey I just spoke to her and sent her to ED per [facility]

## 2022-08-03 NOTE — Telephone Encounter (Signed)
-----   Message from Alen Bleacher, NP sent at 08/03/2022  1:27 PM EDT ----- K critically elevated, does not appear hemolyzed. Needs to come to the ED now for repeat labs.   Do not take any other meds until repeat labs.

## 2022-08-03 NOTE — Progress Notes (Signed)
ReDS Vest / Clip - 08/03/22 1000       ReDS Vest / Clip   Station Marker A    Ruler Value 28    ReDS Value Range Moderate volume overload    ReDS Actual Value 36

## 2022-08-03 NOTE — Progress Notes (Signed)
Advanced Heart Failure Clinic Note   PCP: Sue Lush, PA-C HF Cardiologist: Dr. 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   Has been followed by Dr. Einar Gip for her HF.    Admitted 11/22/20 with ab pain and diarrhea. Ab CT concerning for vasculitis (no pancreatitis). Her TG were found to be 4030. She was started on an insulin/dextrose infusion. Cardiology and Nephrology were consulted after her creatinine and LFTs continued to rise and she began developing hypotension. Echo 11/28/20 EF was found to be 20-25% with moderate RV dysfunction and mild septal flattening. Started on DBA 2.5. AHF consulted for further management. PICC placed to follow CVP and Co-Ox. BP were high and milrinone added to augment diuresis. cMRI c/w prior myocarditis vs sarcoid (see below). Mod pericardial effusion also noted. LVEF 29%. RV normal. She underwent R/LHC showing single vessel RCA occlusion, RA 1, LVEDP 17, Fick CO 5.6/CI 3.88. She was able to be weaned off inotropes and GDMT titrated.     Post hospital follow up, she was drinking >2L/day fluid and eating more salty foods, volume was up, ReDs 43%, instructed to increase lasix to 20 mg bid x 3 days. She followed up with [name] with [facility] 12/17/20 for a 2nd opinion. He placed LifeVest and switched beta blocker to Toprol XL.   Called after hours service with swelling, instructed to continue lasix 20 mg bid.   Seen in ED 12/25/20 for fall. She was standing at work and became dizzy. She fell backwards and hit her head. CT neg, labs showed K 5.7. She was given 1 dose of Lokelma. She denied palpitations.   Volume overloaded at follow up 01/06/21, lasix switched to torsemide. She had a LifeVest per Cardiology at [facility].   Echo (10/22): EF  up to 45-50%, grade II DD, RV ok. LifeVest off.   Treated in ED 10/22 for pancreatitis, then admitted 11/22 for hyperglycemia, did not meet criteria for DKA. Hospitalization c/b hyponatremia, sodium 118. Concern for volume overload. Lasix restarted but SCr increased. Nephrology consulted and restarted gentle IVF and GDMT held for a couple of days. Recommendation for Tolvaptan, however patient declined and requested discharge with outpatient follow-up her MD at [facility]. Jardiance stopped at discharge, weight 129 lbs.    Seen back in ED a few days later with increased weight and abd distention. Given IV lasix in ED and torsemide increased to 40 bid.   Follow up 11/22, her breathing had improved but c/o abdominal swelling/bloating, not felt to be related to volume overload. Concern for possible gastroparesis. She was referred to GI. Abd CT was unremarkable. Ceruloplasmin level was also low, raising concern for possible Wilson's Disease. However had further w/u w/ [facility] and reports Wilson's disease was ruled out. Additional GI testing unremarkable.    Echo 12/22 EF 45%, RV normal.    Follow up 08/02/21, weight up 16 lbs, ReDs 27%. Arranged for RHC to better assess hemodynamics and see if abdominal symptoms related to right-sided HF due to high-output state or non-cardiac (see results below).    [place] 5/23 with elevated filling pressures and normal cardiac ouput. Mild to moderate mixed pulmonary hypertension with prominent v-waves in RA, suggestive of significant TR. Given IV lasix and metolazone 2.5 added weekly to diuretic regimen.    RA = 12  prominent V waves, RV = 47/14, PA = 48/25 (37), PCW = 25, Fick cardiac output/index = 4.4/2.8, PVR =3.7 WU, Ao sat = 99%, PA sat = 62%, 62%   Post cath follow up, volume up, REDs 43%. Torsemide increased and weekly metolazone added.   Admitted 5/23 with AKI and hyperkalemia. Given IVF, insulin, calcium gluconate and Lokelma. Echo showed EF 45-50%, RV normal, mild-mod  MR, RVSP 30. Only mild TR. Restarted on torsemide 40 bid (lower dose) and Entresto 49/51 bid. Arranged paramedicine, discharged home 143 lbs.   Follow up 6/23, several med discrepancies. Labs showed serum glucose 531, AKI and hyperkalemia and advised eval in ED. Admitted, GDMT held and hydrated with IVF. Discharged home on previous regimen on GDMT, weight 148 lbs.  Follow up 09/24/21, felt poorly and fluid overloaded. Treated w/ Furoscix 80 mg daily x 4 days, w/ instruction to switch back to torsemide 40 mg bid thereafter and to return in 2 wks for f/u.   Seen 09/27/21 at [facility] for 2nd opinion, felt to be congested and recommended increasing torsemide to 60 mg bid and instructed f/u in 3 months.   Follow up 7/23, continued with bloating. Med discrepancies with her torsemide. Labs at visit c/w dehydration. Scr elevted 3.61 (up from 2.24), BUN 79. BNP 7.9. Entresto held until repeat labs.  Received Furoscix 8/4, 8/5, and 10/31/21.  Admitted 9/23 for volume overload in setting of RV failure and AKI. Required milrinone. Underwent Cardiomems implant  RHC 9/23  RA 15, PA 37/23 (28), PCWP 16, Fick CO/CI 4.2/2.6, Thermo CO/CI 3.7/2.3, PVR 4.3, PA sat 51%, 54%, PAPi 0.93  Follows with Dr. Lindie Spruce at [facility]. Had renal biopsy recently (12/31/21) and showed DM and HTN nephropathy. They discussed starting HD soon.   Has been followed by Paramedicine. Has been seen in HF clinic several times over the last few months. Multiple discrepancies with medications noted at visits and misses meds at times. Several AKIs after adjusting diuretics (including self adjustments). Has used Furoscix (poor response), metolazone and torsemide without perceived improvement in HF symptoms. Has had multiple admissions for vasculitis, pancreatitis, AKI, hyperglycemia, volume overload and electrolyte imbalances since. Has required inotropic support several times in the past.     Admitted 2/24 with DKA/hypertriglyceridemia, TG >5000.  Concern for pancreatitis. CTA/P without acute process, no pancreatitis. Started on insulin/D10 gtt. Treated w/ Macrobid for UTI. Was going to d/c 06/01/22 however experienced pleuritic/substernal chest pain with desats while ambulating, d/c held off. EKG stable and ultimately resolved. AHF team consulted for volume overload. Echo showed EF 30-35% (lower than past), RV mildly dilated, moderate-severe MR, severe TR, dilated IVC. Started on milrinone, diuresed with IV lasix. Drips weaned. Continued on torsemide 80 mg daily, off beta blocker. She was discharged home, weight 131 lbs. Seen back in ED 3 days later with N/V and abd pain. Received IVF and Zofran.  Today she returns for AHF follow up with paramedicine. Overall feeling fine. Denies CP, edema, or PND/Orthopnea. SOB when going up and down the stairs, and with walking her dogs, not SOB walking into clinic today. Says breathing has gotten better. Does not have an appetite, only eats food when her blood sugar drops. No fever or chills. Intt palpitations with activity. Weight at home 130 pounds. Checks her BP at home SBP around 70s-90s, feels dizzy and lightheaded when this happens. Does not take BP meds when she feels this way, happens ~ 2x/week. Has seen genetic counselor for her HyperTG. Some days she just  chooses not to take her medicines. Not compliant with cardiomems as she keeps her pillow on the floor and forgets.   Labs reviewed:  3/24: BNP 70.1, K 4.3, SCr 1.53  ROS: All systems reviewed and negative except as per HPI.   Past Medical History:  Diagnosis Date   Brain tumor (HCC) 03/29/1995   astrocytoma   CHF (congestive heart failure) (HCC)    Cholesterosis    DM (diabetes mellitus) (HCC) 10/10/2018   Fatty liver    HTN (hypertension) 10/10/2018   Hypothyroidism 10/10/2018   Lipoprotein deficiency    Lung disease    longevity long term   Pancreatitis    Polycystic ovary syndrome    Current Outpatient Medications  Medication Sig  Dispense Refill   albuterol (VENTOLIN HFA) 108 (90 Base) MCG/ACT inhaler Inhale 1-2 puffs into the lungs every 6 (six) hours as needed for wheezing or shortness of breath.     aspirin EC 81 MG EC tablet Take 1 tablet (81 mg total) by mouth daily. Swallow whole. 30 tablet 1   carvedilol (COREG) 6.25 MG tablet Take 1 tablet (6.25 mg total) by mouth 2 (two) times daily. 180 tablet 3   CREON 36000-114000 units CPEP capsule Take 36,000 Units by mouth 3 (three) times daily.     DULoxetine (CYMBALTA) 30 MG capsule Take 30 mg by mouth at bedtime.     ergocalciferol (VITAMIN D2) 1.25 MG (50000 UT) capsule Take 50,000 Units by mouth every 7 (seven) days.     Evolocumab (REPATHA) 140 MG/ML SOSY Inject 140 mg into the skin See admin instructions. Inject 140 mg subcutaneously every other Monday evening     fenofibrate 160 MG tablet Take 1 tablet (160 mg total) by mouth daily. 30 tablet 1   hydrALAZINE (APRESOLINE) 50 MG tablet Take 1 tablet (50 mg total) by mouth every 8 (eight) hours. 90 tablet 0   Insulin Pen Needle 32G X 4 MM MISC 1 Device by Does not apply route QID. For use with insulin pens 100 each 0   insulin regular human CONCENTRATED (HUMULIN R U-500 KWIKPEN) 500 UNIT/ML KwikPen Inject 120 Units into the skin 3 (three) times daily with meals.     ivabradine (CORLANOR) 7.5 MG TABS tablet Take 1 tablet (7.5 mg total) by mouth 2 (two) times daily with a meal. 60 tablet 6   levocetirizine (XYZAL) 5 MG tablet Take 5 mg by mouth daily.     levothyroxine (SYNTHROID) 125 MCG tablet Take 1 tablet (125 mcg total) by mouth daily at 6 (six) AM. 30 tablet 1   pantoprazole (PROTONIX) 40 MG tablet Take 40 mg by mouth daily.     pregabalin (LYRICA) 150 MG capsule Take 300 mg by mouth at bedtime.     prochlorperazine (COMPAZINE) 5 MG tablet Take 5 mg by mouth daily.     rosuvastatin (CRESTOR) 40 MG tablet Take 40 mg by mouth daily.     sildenafil (REVATIO) 20 MG tablet Take 1 tablet (20 mg total) by mouth 3 (three)  times daily. 90 tablet 3   sodium bicarbonate 650 MG tablet Take 650 mg by mouth 2 (two) times daily.     spironolactone (ALDACTONE) 25 MG tablet Take 1 tablet (25 mg total) by mouth daily. 30 tablet 0   torsemide (DEMADEX) 20 MG tablet Take 40 mg by mouth daily.     tretinoin (RETIN-A) 0.05 % cream Apply 1 application topically at bedtime.     No current facility-administered medications for this  encounter.   Allergies  Allergen Reactions   Ketamine Other (See Comments)    In vegetative state for 15 minutes per pt   Erythromycin     unk   Fentanyl Nausea And Vomiting   Maitake Mushroom [Maitake] Itching    Itchy throat   Penicillin V Itching    Gi upset   Shellfish Allergy Other (See Comments)    Pt has never had shellfish but tested positive on allergy test. Pt states contrast in CT is okay   Morphine Itching and Rash   Penicillins Rash    Has patient had a PCN reaction causing immediate rash, facial/tongue/throat swelling, SOB or lightheadedness with hypotension: Y Has patient had a PCN reaction causing severe rash involving mucus membranes or skin necrosis: Y Has patient had a PCN reaction that required hospitalization: N Has patient had a PCN reaction occurring within the last 10 years: Y If all of the above answers are "NO", then may proceed with Cephalosporin use.    Prednisone Rash   Social History   Socioeconomic History   Marital status: Divorced    Spouse name: Not on file   Number of children: 0   Years of education: Not on file   Highest education level: Not on file  Occupational History   Occupation: [employer]  Tobacco Use   Smoking status: Never   Smokeless tobacco: Never  Vaping Use   Vaping Use: Never used  Substance and Sexual Activity   Alcohol use: Never   Drug use: Never   Sexual activity: Yes  Other Topics Concern   Not on file  Social History Narrative   Not on file   Social Determinants of Health   Financial Resource Strain: High  Risk (04/05/2022)   Overall Financial Resource Strain (CARDIA)    Difficulty of Paying Living Expenses: Hard  Food Insecurity: No Food Insecurity (06/15/2022)   Hunger Vital Sign    Worried About Running Out of Food in the Last Year: Never true    Ran Out of Food in the Last Year: Never true  Transportation Needs: Unmet Transportation Needs (06/15/2022)   PRAPARE - Transportation    Lack of Transportation (Medical): Yes    Lack of Transportation (Non-Medical): Yes  Physical Activity: Sufficiently Active (12/10/2021)   Exercise Vital Sign    Days of Exercise per Week: 3 days    Minutes of Exercise per Session: 60 min  Stress: No Stress Concern Present (12/10/2021)   [employer] of Occupational Health - Occupational Stress Questionnaire    Feeling of Stress : Not at all  Social Connections: Socially Integrated (12/10/2021)   Social Connection and Isolation Panel [NHANES]    Frequency of Communication with Friends and Family: More than three times a week    Frequency of Social Gatherings with Friends and Family: More than three times a week    Attends Religious Services: More than 4 times per year    Active Member of [employer] or Organizations: No    Attends Engineer, structural: More than 4 times per year    Marital Status: Married  Catering manager Violence: Not At Risk (06/15/2022)   Humiliation, Afraid, Rape, and Kick questionnaire    Fear of Current or Ex-Partner: No    Emotionally Abused: No    Physically Abused: No    Sexually Abused: No   Family History  Problem Relation Age of Onset   Diabetes Mother    Hypertension Mother    Hyperlipidemia Mother  Thyroid disease Mother    Hypertension Father    Diabetes Father    Pancreatic cancer Paternal Aunt    Pancreatic cancer Paternal Uncle    Colon cancer Neg Hx    Esophageal cancer Neg Hx    Stomach cancer Neg Hx    BP (!) 148/100   Pulse (!) 112   Wt 60.6 kg (133 lb 9.6 oz)   SpO2 97%   BMI 28.91 kg/m   Wt  Readings from Last 3 Encounters:  08/03/22 60.6 kg (133 lb 9.6 oz)  07/20/22 59 kg (130 lb)  07/14/22 59 kg (130 lb)   PHYSICAL EXAM: General:  chronically ill appearing.  No respiratory difficulty HEENT: ptosis  Neck: supple. JVD flat. Carotids 2+ bilat; no bruits. No lymphadenopathy or thyromegaly appreciated. Cor: PMI nondisplaced. Tachy rate & regular rhythm. No rubs, gallops or murmurs. Lungs: clear Abdomen: soft, nontender, nondistended. No hepatosplenomegaly. No bruits or masses. Good bowel sounds. Extremities: no cyanosis, clubbing, rash, trace RLE edema (baseline) Neuro: alert & oriented x 3, cranial nerves grossly intact. moves all 4 extremities w/o difficulty. Affect pleasant.   ECG (personally reviewed): ST 113 bpm   Cardiomems (personally reviewed): PAD 16 07/20/22, goal 17  ReDS 36%  ASSESSMENT & PLAN: Nonischemic cardiomyopathy, recovered EF, previously with HFrEF (EF 29%) - Echo (5/23): EF 45-50%, RV normal  - Echo 9/23: EF 50-55%, RV normal. mild MR, mild-mod TR - Echo 3/24 EF 45-50% mild-mod TR, moderate MR - Echo (06/10/22) EF 30-35% (lower than past), RV mildly dilated, mod-sev MR, sev TR. - NYHA III-IIIb, volume good today, has not been using cardiomems consistently, persistent issue.  - Continue Coreg 6.25 mg bid - Continue torsemide 40 mg daily, reiterated importance of compliance following medication regimen. ReDs slightly elevated at 36%.  - Continue spiro 25 mg daily. - Continue hydralazine 50 mg tid.  - Continue ivabradine 7.5 mg bid.   - Off Jardiance due to frequent GU infections (currently treating a UTI) - Off Entresto with AKI.  - update echo next visit - BMET today.   2. CAD - Single vessel RCA occlusion (small co-dominant vessel) on cardiac cath 09/08. - Medical management - No chest pain - Continue aspirin + statin + Repatha (awaiting insurance approval)  3.  CKD IV - Frequent AKIs - Scr baseline variable with multiple AKIs, typically  2.0-2.5 - Working with Dr. Lindie Spruce (Nephrology at [facility]). Had renal biopsy recently (12/31/21) and showed DM and HTN nephropathy. Discussing timing of HD start. Has started HD education - BMET today   4. Pulmonary hypertension - Mild to moderate on RHC 5/23. PVR 3.7 WU. - RHC 12/06/21: RA 15, PA 37/23 (28), PCWP 16, Fick CO/CI 4.2/2.6, Thermo CO/CI 3.7/2.3, PVR 4.3, PA sat 51%, 54%, PAPi 0.93  - VQ negative - Continue sildenafil 20 mg tid.   5. TR - Prominent v-waves in RA tracing on RHC 5/23. - Echo 5/23 shows only mild TR  - Mild to mod on 9/23 echo - moderate on echo (3/24)   6. Severe hyperTG - Due to LPL deficiency w/ recent pancreatitis. - Continue fenofibrate  - Recent admission >5000>1430>1271 - CTA/P negative for pancreatitis or other acute findings  - Has been referred to Lipid Clinic for further management, waiting to schedule   7. Type 3c DM - Follows w/ endocrinology at [facility] - on glipizide - 2/2 chronic pancreatitis    8. Chronic abdominal swelling/bloating - Seen at [facility] (11/20) for IBS, per chart review. -  Had penumatosis intestinalis. She was started on pancreatic enzyme replacement. - EGD (10/21): normal, bx taken to assess for celiac. - CT abdomen (2/22) and RUQ US showed no obvious gallbladder stones. - HIDA (4/22): ended early due to tachypnea, but cystic duct and gallbladder appeared WNL. - CT abdomen 12/22 unremarkable  - US abdomen 11/25/21: No ascites - suspect primarily fatty liver - Abd XR 12/27: mild gaseous distention   9. Poor Med Compliance/SDOH - Followed by paramedicine, greatly appreciate their assistance. May be discharged soon 2/2 persistent noncompliance - She now has Medicaid, uses Medicaid transport.  Follow up in 2-3 months with Dr. Gala Romney as scheduled with echo  Brynda Peon, AGACNP-BC  08/03/22  Advanced Heart Failure Clinic [facility] [address] [facility] [place] [address] [phone]  (office) [phone] (fax)

## 2022-08-10 DIAGNOSIS — H34832 Tributary (branch) retinal vein occlusion, left eye, with macular edema: Secondary | ICD-10-CM | POA: Diagnosis not present

## 2022-08-10 DIAGNOSIS — Z7689 Persons encountering health services in other specified circumstances: Secondary | ICD-10-CM | POA: Diagnosis not present

## 2022-08-11 ENCOUNTER — Encounter (HOSPITAL_COMMUNITY): Payer: Self-pay | Admitting: Internal Medicine

## 2022-08-11 ENCOUNTER — Other Ambulatory Visit (HOSPITAL_COMMUNITY): Payer: Self-pay

## 2022-08-11 ENCOUNTER — Telehealth (HOSPITAL_COMMUNITY): Payer: Self-pay | Admitting: Licensed Clinical Social Worker

## 2022-08-11 DIAGNOSIS — I5022 Chronic systolic (congestive) heart failure: Secondary | ICD-10-CM

## 2022-08-11 MED ORDER — IVABRADINE HCL 7.5 MG PO TABS: 7.5000 mg | ORAL_TABLET | Freq: Two times a day (BID) | ORAL | 6 refills | Status: DC

## 2022-08-11 MED ORDER — SPIRONOLACTONE 25 MG PO TABS: 25.0000 mg | ORAL_TABLET | Freq: Every day | ORAL | 6 refills | Status: DC

## 2022-08-11 NOTE — Telephone Encounter (Signed)
H&V Care Navigation CSW Progress Note  Clinical Social Worker informed by [employer] that pt ran into barrier to making Medicaid transportation arrangements to get to upcoming appts with [facility].  CSW consulted Managed Medicaid team to assist with arranging these rides for pt.  Patient is participating in a Managed Medicaid Plan:  Yes  SDOH Screenings   Food Insecurity: No Food Insecurity (06/15/2022)  Housing: Low Risk  (06/15/2022)  Recent Concern: Housing - Medium Risk (04/05/2022)  Transportation Needs: Unmet Transportation Needs (08/11/2022)  Utilities: Not At Risk (06/15/2022)  Alcohol Screen: Low Risk  (12/10/2021)  Depression (PHQ2-9): Low Risk  (12/10/2021)  Financial Resource Strain: High Risk (04/05/2022)  Physical Activity: Sufficiently Active (12/10/2021)  Social Connections: Socially Integrated (12/10/2021)  Stress: No Stress Concern Present (12/10/2021)  Tobacco Use: Low Risk  (08/03/2022)    Burna Sis, LCSW Clinical Social Worker Advanced Heart Failure Clinic Desk#: [phone] Cell#: [phone]

## 2022-08-11 NOTE — Progress Notes (Signed)
Paramedicine Encounter    Patient ID: Jennifer [name], female    DOB: 1990-05-28, 32 y.o.   MRN: [id number]   Complaints-abd pain--UTI?   Edema-none   Compliance with meds-?   Pill box filled-yes- If so, by whom-pt filled it -I checked it and it was ok She had made sticky note with the things needed.   She said her doc took her off glipizide b/c it was not working.    Refills needed- Corlanor  Sildenafil  Rosuvastatin  Cleda Daub     Pt reports she is doing ok. [name] having upper left sided abd pain, she thinks its UTI related-she has not gotten the antibiotic yet for it-[facility], [facility] so I told [facility] to [facility] and then [facility].  She reports medicaid will not take her to [facility] appointments and the out of county needs prior approval-unsure what this means. Spoke with [place] and she will refer her to managed medicaid group and see if they can be assistance for this. She would need to access [facility] for her PCP labs needed. Will continue to f/u.   Will touch base next week.  I also gave her med list that she can edit if needed for her to go by when filling pill box up.   BP (!) 160/84   Pulse (!) 114   Resp 16   Wt 130 lb (59 kg)   SpO2 98%   BMI 28.13 kg/m  Weight yesterday-130 Last visit weight-133  Patient Care Team: Leonard Downing as PCP - General (Physician Assistant) Dorisann Frames, MD as PCP - Endocrinology (Endocrinology)  Patient Active Problem List   Diagnosis Date Noted   UTI (urinary tract infection) due to Enterococcus 06/01/2022   E. coli UTI (urinary tract infection) 06/01/2022   DKA (diabetic ketoacidosis) (HCC) 06/01/2022   Pancreatitis 05/29/2022   Hypokalemia 03/23/2022   COVID 03/15/2022   COVID-19 virus infection 03/14/2022   Hyponatremia 03/14/2022   Positive D dimer 03/14/2022   Stage 3b chronic kidney disease (CKD) (HCC) 03/14/2022   Pulmonary hypertension (HCC) 02/06/2022    NASH (nonalcoholic steatohepatitis) 02/06/2022   Obesity (BMI 30-39.9) 02/06/2022   Heart failure (HCC) 12/03/2021   Acute on chronic systolic CHF (congestive heart failure) (HCC) 12/02/2021   Elevated troponin 12/02/2021   Pancytopenia (HCC) 12/02/2021   Amenorrhea 12/02/2021   Hyperosmolar hyperglycemic state (HHS) (HCC) 08/26/2021   Small intestinal bacterial overgrowth (SIBO) 08/26/2021   Lower extremity edema 08/26/2021   Hyperkalemia 08/14/2021   Hx of insulin dependent diabetes mellitus 08/14/2021   CKD (chronic kidney disease) stage 4, GFR 15-29 ml/min (HCC) 05/13/2021   Chest pain 03/10/2021   Acute on chronic combined systolic and diastolic CHF (congestive heart failure) (HCC) 02/09/2021   History of astrocytoma of brain 02/09/2021   Severe hyperglycemia due to diabetes mellitus (HCC) 01/25/2021   Diarrhea    Elevated transaminase level    Acute combined systolic and diastolic heart failure (HCC)    Nonischemic cardiomyopathy (HCC)    Acute decompensated heart failure (HCC)    Elevated liver enzymes    Acute renal failure superimposed on stage 3a chronic kidney disease (HCC) 11/26/2020   Intractable abdominal pain 11/23/2020   Chronic systolic CHF (congestive heart failure) (HCC) 11/23/2020   Essential hypertension 11/23/2020   Hypertriglyceridemia 11/23/2020   Prolonged QT interval 11/23/2020   Type 2 diabetes mellitus with hyperlipidemia (HCC) 10/10/2018  Hypothyroidism 10/10/2018    Current Outpatient Medications:    albuterol (VENTOLIN HFA) 108 (90 Base) MCG/ACT inhaler, Inhale 1-2 puffs into the lungs every 6 (six) hours as needed for wheezing or shortness of breath., Disp: , Rfl:    aspirin EC 81 MG EC tablet, Take 1 tablet (81 mg total) by mouth daily. Swallow whole., Disp: 30 tablet, Rfl: 1   carvedilol (COREG) 6.25 MG tablet, Take 1 tablet (6.25 mg total) by mouth 2 (two) times daily., Disp: 180 tablet, Rfl: 3   CREON 36000-114000 units CPEP capsule, Take  36,000 Units by mouth 3 (three) times daily., Disp: , Rfl:    DULoxetine (CYMBALTA) 30 MG capsule, Take 30 mg by mouth at bedtime., Disp: , Rfl:    ergocalciferol (VITAMIN D2) 1.25 MG (50000 UT) capsule, Take 50,000 Units by mouth every 7 (seven) days., Disp: , Rfl:    Evolocumab (REPATHA) 140 MG/ML SOSY, Inject 140 mg into the skin See admin instructions. Inject 140 mg subcutaneously every other Monday evening, Disp: , Rfl:    fenofibrate 160 MG tablet, Take 1 tablet (160 mg total) by mouth daily., Disp: 30 tablet, Rfl: 1   hydrALAZINE (APRESOLINE) 50 MG tablet, Take 1 tablet (50 mg total) by mouth every 8 (eight) hours., Disp: 90 tablet, Rfl: 0   Insulin Pen Needle 32G X 4 MM MISC, 1 Device by Does not apply route QID. For use with insulin pens, Disp: 100 each, Rfl: 0   insulin regular human CONCENTRATED (HUMULIN R U-500 KWIKPEN) 500 UNIT/ML KwikPen, Inject 120 Units into the skin 3 (three) times daily with meals., Disp: , Rfl:    ivabradine (CORLANOR) 7.5 MG TABS tablet, Take 1 tablet (7.5 mg total) by mouth 2 (two) times daily with a meal., Disp: 60 tablet, Rfl: 6   levocetirizine (XYZAL) 5 MG tablet, Take 5 mg by mouth daily., Disp: , Rfl:    levothyroxine (SYNTHROID) 125 MCG tablet, Take 1 tablet (125 mcg total) by mouth daily at 6 (six) AM., Disp: 30 tablet, Rfl: 1   pantoprazole (PROTONIX) 40 MG tablet, Take 40 mg by mouth daily., Disp: , Rfl:    pregabalin (LYRICA) 150 MG capsule, Take 300 mg by mouth at bedtime., Disp: , Rfl:    prochlorperazine (COMPAZINE) 5 MG tablet, Take 5 mg by mouth daily., Disp: , Rfl:    rosuvastatin (CRESTOR) 40 MG tablet, Take 40 mg by mouth daily., Disp: , Rfl:    sildenafil (REVATIO) 20 MG tablet, Take 1 tablet (20 mg total) by mouth 3 (three) times daily., Disp: 90 tablet, Rfl: 3   sodium bicarbonate 650 MG tablet, Take 650 mg by mouth 2 (two) times daily., Disp: , Rfl:    torsemide (DEMADEX) 20 MG tablet, Take 40 mg by mouth daily., Disp: , Rfl:    tretinoin  (RETIN-A) 0.05 % cream, Apply 1 application topically at bedtime., Disp: , Rfl:    glipiZIDE (GLUCOTROL) 5 MG tablet, Take 5 mg by mouth daily before breakfast. (Patient not taking: Reported on 08/11/2022), Disp: , Rfl:    spironolactone (ALDACTONE) 25 MG tablet, Take 1 tablet (25 mg total) by mouth daily., Disp: 30 tablet, Rfl: 6 Allergies  Allergen Reactions   Ketamine Other (See Comments)    In vegetative state for 15 minutes per pt   Erythromycin     unk   Fentanyl Nausea And Vomiting   Maitake Mushroom [Maitake] Itching    Itchy throat   Penicillin V Itching    Gi upset  Shellfish Allergy Other (See Comments)    Pt has never had shellfish but tested positive on allergy test. Pt states contrast in CT is okay   Morphine Itching and Rash   Penicillins Rash    Has patient had a PCN reaction causing immediate rash, facial/tongue/throat swelling, SOB or lightheadedness with hypotension: Y Has patient had a PCN reaction causing severe rash involving mucus membranes or skin necrosis: Y Has patient had a PCN reaction that required hospitalization: N Has patient had a PCN reaction occurring within the last 10 years: Y If all of the above answers are "NO", then may proceed with Cephalosporin use.    Prednisone Rash      Social History   Socioeconomic History   Marital status: Divorced    Spouse name: Not on file   Number of children: 0   Years of education: Not on file   Highest education level: Not on file  Occupational History   Occupation: [employer]  Tobacco Use   Smoking status: Never   Smokeless tobacco: Never  Vaping Use   Vaping Use: Never used  Substance and Sexual Activity   Alcohol use: Never   Drug use: Never   Sexual activity: Yes  Other Topics Concern   Not on file  Social History Narrative   Not on file   Social Determinants of Health   Financial Resource Strain: High Risk (04/05/2022)   Overall Financial Resource Strain (CARDIA)    Difficulty of  Paying Living Expenses: Hard  Food Insecurity: No Food Insecurity (08/12/2022)   Hunger Vital Sign    Worried About Running Out of Food in the Last Year: Never true    Ran Out of Food in the Last Year: Never true  Transportation Needs: Unmet Transportation Needs (08/11/2022)   PRAPARE - Transportation    Lack of Transportation (Medical): Yes    Lack of Transportation (Non-Medical): Yes  Physical Activity: Sufficiently Active (12/10/2021)   Exercise Vital Sign    Days of Exercise per Week: 3 days    Minutes of Exercise per Session: 60 min  Stress: No Stress Concern Present (12/10/2021)   [employer] of Occupational Health - Occupational Stress Questionnaire    Feeling of Stress : Not at all  Social Connections: Socially Integrated (12/10/2021)   Social Connection and Isolation Panel [NHANES]    Frequency of Communication with Friends and Family: More than three times a week    Frequency of Social Gatherings with Friends and Family: More than three times a week    Attends Religious Services: More than 4 times per year    Active Member of Clubs or Organizations: No    Attends Engineer, structural: More than 4 times per year    Marital Status: Married  Catering manager Violence: Not At Risk (08/12/2022)   Humiliation, Afraid, Rape, and Kick questionnaire    Fear of Current or Ex-Partner: No    Emotionally Abused: No    Physically Abused: No    Sexually Abused: No    Physical Exam      Future Appointments  Date Time Provider Department Center  08/19/2022 11:30 AM Shaune Leeks [facility] None  10/04/2022 10:10 AM [facility] OP [facility]  10/04/2022 11:20 AM [name], [name], MD [facility] None  11/04/2022  9:00 AM [name], [name], MD [facility]       Kerry Hough, Paramedic [phone] [facility] Paramedic  08/15/22

## 2022-08-12 ENCOUNTER — Encounter (HOSPITAL_COMMUNITY): Payer: Self-pay

## 2022-08-12 ENCOUNTER — Telehealth (HOSPITAL_COMMUNITY): Payer: Self-pay

## 2022-08-12 ENCOUNTER — Other Ambulatory Visit: Payer: Medicaid Other

## 2022-08-12 NOTE — Patient Instructions (Signed)
Visit Information  Ms. Greenberger was given information about Medicaid Managed Care team care coordination services as a part of their [facility] Medicaid benefit. Edmae Rundlett verbally consented to engagement with the [facility] Managed Care team.   If you are experiencing a medical emergency, please call 911 or report to your local emergency department or urgent care.   If you have a non-emergency medical problem during routine business hours, please contact your provider's office and ask to speak with a nurse.   For questions related to your [facility] health plan, please call: [phone] or go here:[email or web address]  If you would like to schedule transportation through your [facility] plan, please call the following number at least 2 days in advance of your appointment: [phone].   You can also use the MTM portal or MTM mobile app to manage your rides. Reimbursement for transportation is available through [facility]! For the portal, please go to [email or web address]  Call the [facility] Crisis Line at [phone], at any time, 24 hours a day, 7 days a week. If you are in danger or need immediate medical attention call 911.  If you would like help to quit smoking, call 1-800-QUIT-NOW (502 501 2300) OR Espaol: 1-855-Djelo-Ya (4-132-440-1027) o para ms informacin haga clic aqu or Text READY to 253-664 to register via text  Ms. Oler - following are the goals we discussed in your visit today:   Goals Addressed   None      Social Worker will follow up on 08/19/22.   Gus Puma, Kenard Gower, MHA [facility]  Managed Medicaid Social Worker [phone]   Following is a copy of your plan of care:  There are no care plans that you recently modified to display for this patient.

## 2022-08-12 NOTE — Telephone Encounter (Signed)
-----   Message from Alen Bleacher, NP sent at 08/11/2022  3:46 PM EDT ----- Repeat K stable. Ok to restart meds.  ----- Message ----- From: Odette Fraction, CMA Sent: 08/11/2022   3:14 PM EDT To: Alen Bleacher, NP   ----- Message ----- From: Kerry Hough, Paramedic Sent: 08/10/2022   4:31 PM EDT To: [facility] Triage Pool  She did go to ER for lab recheck, does she need to sch another set of labs anytime soon to recheck again?   ----- Message ----- From: Alen Bleacher, NP Sent: 08/03/2022   1:27 PM EDT To: Kerry Hough, Paramedic; [facility] Triage Pool  K critically elevated, does not appear hemolyzed. Needs to come to the ED now for repeat labs.   Do not take any other meds until repeat labs.

## 2022-08-12 NOTE — Telephone Encounter (Signed)
LMOM and mychart message sent.

## 2022-08-12 NOTE — Patient Outreach (Signed)
Medicaid Managed Care Social Work Note  08/12/2022 Name:  Jennifer [name] MRN:  [id number] DOB:  Dec 31, 1990  Jennifer [name] is an 32 y.o. year old female who is a primary patient of Leonard Downing.  The [facility] Managed Care Coordination team was consulted for assistance with:  Transportation Needs   Ms. Feig was given information about Medicaid Managed Care Coordination team services today. [name] Patient agreed to services and verbal consent obtained.  Engaged with patient  for by telephone forinitial visit in response to referral for case management and/or care coordination services.   Assessments/Interventions:  Review of past medical history, allergies, medications, health status, including review of consultants reports, laboratory and other test data, was performed as part of comprehensive evaluation and provision of chronic care management services.  SDOH: (Social Determinant of Health) assessments and interventions performed: SDOH Interventions    Flowsheet Row Telephone from 08/11/2022 in [facility] from 04/05/2022 in [facility] Telephone from 08/19/2021 in [facility] ED to Hosp-Admission (Discharged) from 11/22/2020 in [facility]  SDOH Interventions      Food Insecurity Interventions -- -- Other (Comment)  [food pantries] Intervention Not Indicated  Housing Interventions -- Other (Comment)  [provided resources for financial assistance] Intervention Not Indicated Intervention Not Indicated  Transportation Interventions Payor Benefit  [referred to managed medicaid team] Payor Benefit Intervention Not Indicated Intervention Not Indicated  Financial Strain Interventions -- Other (Comment)  [provided resources for financial resources] -- Intervention Not Indicated      BSW completed a  telephone outreach with patient, she states she is able to get and schedule transportatoin, but in order to get transportation to her appointments at [facility] she has to get a pre approval and does not know where that approval would come from. BSW sent a message to [facility] Rep to get guidance on this matter. No other resources are needed at this time. BSW will follow up with patient once she gets a follow up from [facility] Advanced Directives Status:  Not addressed in this encounter.  Care Plan                 Allergies  Allergen Reactions   Ketamine Other (See Comments)    In vegetative state for 15 minutes per pt   Erythromycin     unk   Fentanyl Nausea And Vomiting   Maitake Mushroom [Maitake] Itching    Itchy throat   Penicillin V Itching    Gi upset   Shellfish Allergy Other (See Comments)    Pt has never had shellfish but tested positive on allergy test. Pt states contrast in CT is okay   Morphine Itching and Rash   Penicillins Rash    Has patient had a PCN reaction causing immediate rash, facial/tongue/throat swelling, SOB or lightheadedness with hypotension: Y Has patient had a PCN reaction causing severe rash involving mucus membranes or skin necrosis: Y Has patient had a PCN reaction that required hospitalization: N Has patient had a PCN reaction occurring within the last 10 years: Y If all of the above answers are "NO", then may proceed with Cephalosporin use.    Prednisone Rash    Medications Reviewed Today     Reviewed by Kerry Hough, Paramedic (Certified Medical Assistant) on 08/11/22 at [id number]  Med List Status: <None>   Medication Order Taking? Sig Documenting Provider Last  Dose Status Informant  albuterol (VENTOLIN HFA) 108 (90 Base) MCG/ACT inhaler [id number] Yes Inhale 1-2 puffs into the lungs every 6 (six) hours as needed for wheezing or shortness of breath. [provider] Taking Active Self           Med Note (ADSIDE, Novella Rob   Wed Jan 26, 2022 10:08 AM)     aspirin EC 81 MG EC tablet [id number] Yes Take 1 tablet (81 mg total) by mouth daily. Swallow whole. Pennie Banter, DO Taking Active Self  carvedilol (COREG) 6.25 MG tablet [id number] Yes Take 1 tablet (6.25 mg total) by mouth 2 (two) times daily. [place], [place] Taking Active   CREON [id number] [id number] units CPEP capsule [id number] Yes Take 36,000 Units by mouth 3 (three) times daily. [provider] Taking Active Self  DULoxetine (CYMBALTA) 30 MG capsule [id number] Yes Take 30 mg by mouth at bedtime. [provider] Taking Active Self  ergocalciferol (VITAMIN D2) 1.25 MG (50000 UT) capsule [id number] Yes Take 50,000 Units by mouth every 7 (seven) days. [provider] Taking Active Self           Med Note Rosilyn Mings, Freddie Apley   Wed Jun 22, 2022  3:24 PM)    Evolocumab (REPATHA) 140 MG/ML SOSY [id number] Yes Inject 140 mg into the skin See admin instructions. Inject 140 mg subcutaneously every other Monday evening [provider] Taking Active Self           Med Note (ADSIDE, Novella Rob   Wed Jan 26, 2022 10:12 AM)    fenofibrate 160 MG tablet [id number] Yes Take 1 tablet (160 mg total) by mouth daily. Pennie Banter, DO Taking Active Self  glipiZIDE (GLUCOTROL) 5 MG tablet [id number] No Take 5 mg by mouth daily before breakfast.  Patient not taking: Reported on 08/11/2022   [provider] Not Taking Active   hydrALAZINE (APRESOLINE) 50 MG tablet [id number] Yes Take 1 tablet (50 mg total) by mouth every 8 (eight) hours. Hughie Closs, MD Taking Active Self  Insulin Pen Needle 32G X 4 MM MISC [id number] Yes 1 Device by Does not apply route QID. For use with insulin pens [name], MD Taking Active Self  insulin regular human CONCENTRATED (HUMULIN R U-500 KWIKPEN) 500 UNIT/ML KwikPen [id number] Yes Inject 120 Units into the skin 3 (three) times daily with meals. Simonne Martinet, NP Taking Active Self           Med Note Elijio Miles M   Wed Jun 22, 2022   3:24 PM)    ivabradine (CORLANOR) 7.5 MG TABS tablet [id number] Yes Take 1 tablet (7.5 mg total) by mouth 2 (two) times daily with a meal. [name], [name], MD Taking Active Self  levocetirizine (XYZAL) 5 MG tablet [id number] Yes Take 5 mg by mouth daily. [provider] Taking Active Self  levothyroxine (SYNTHROID) 125 MCG tablet [id number] Yes Take 1 tablet (125 mcg total) by mouth daily at 6 (six) AM. Pennie Banter, DO Taking Active Self  pantoprazole (PROTONIX) 40 MG tablet [id number] Yes Take 40 mg by mouth daily. [provider] Taking Active Self  pregabalin (LYRICA) 150 MG capsule [id number] Yes Take 300 mg by mouth at bedtime. [provider] Taking Active Self  prochlorperazine (COMPAZINE) 5 MG tablet [id number] Yes Take 5 mg by mouth daily. [provider] Taking Active Self           Med Note Rosilyn Mings, Nicholaus Bloom M   Wed  Jun 22, 2022  3:24 PM)    rosuvastatin (CRESTOR) 40 MG tablet [id number] Yes Take 40 mg by mouth daily. [provider] Taking Active Self  sildenafil (REVATIO) 20 MG tablet [id number] Yes Take 1 tablet (20 mg total) by mouth 3 (three) times daily. [name], [name], MD Taking Active Self  sodium bicarbonate 650 MG tablet [id number] Yes Take 650 mg by mouth 2 (two) times daily. [provider] Taking Active Self  spironolactone (ALDACTONE) 25 MG tablet [id number] Yes Take 1 tablet (25 mg total) by mouth daily. Hughie Closs, MD Taking Active Self  torsemide (DEMADEX) 20 MG tablet [id number] Yes Take 40 mg by mouth daily. [provider] Taking Active   tretinoin (RETIN-A) 0.05 % cream [id number] Yes Apply 1 application topically at bedtime. [provider] Taking Active Self            Patient Active Problem List   Diagnosis Date Noted   UTI (urinary tract infection) due to Enterococcus 06/01/2022   E. coli UTI (urinary tract infection) 06/01/2022   DKA (diabetic ketoacidosis) (HCC) 06/01/2022    Pancreatitis 05/29/2022   Hypokalemia 03/23/2022   COVID 03/15/2022   COVID-19 virus infection 03/14/2022   Hyponatremia 03/14/2022   Positive D dimer 03/14/2022   Stage 3b chronic kidney disease (CKD) (HCC) 03/14/2022   Pulmonary hypertension (HCC) 02/06/2022   NASH (nonalcoholic steatohepatitis) 02/06/2022   Obesity (BMI 30-39.9) 02/06/2022   Heart failure (HCC) 12/03/2021   Acute on chronic systolic CHF (congestive heart failure) (HCC) 12/02/2021   Elevated troponin 12/02/2021   Pancytopenia (HCC) 12/02/2021   Amenorrhea 12/02/2021   Hyperosmolar hyperglycemic state (HHS) (HCC) 08/26/2021   Small intestinal bacterial overgrowth (SIBO) 08/26/2021   Lower extremity edema 08/26/2021   Hyperkalemia 08/14/2021   Hx of insulin dependent diabetes mellitus 08/14/2021   CKD (chronic kidney disease) stage 4, GFR 15-29 ml/min (HCC) 05/13/2021   Chest pain 03/10/2021   Acute on chronic combined systolic and diastolic CHF (congestive heart failure) (HCC) 02/09/2021   History of astrocytoma of brain 02/09/2021   Severe hyperglycemia due to diabetes mellitus (HCC) 01/25/2021   Diarrhea    Elevated transaminase level    Acute combined systolic and diastolic heart failure (HCC)    Nonischemic cardiomyopathy (HCC)    Acute decompensated heart failure (HCC)    Elevated liver enzymes    Acute renal failure superimposed on stage 3a chronic kidney disease (HCC) 11/26/2020   Intractable abdominal pain 11/23/2020   Chronic systolic CHF (congestive heart failure) (HCC) 11/23/2020   Essential hypertension 11/23/2020   Hypertriglyceridemia 11/23/2020   Prolonged QT interval 11/23/2020   Type 2 diabetes mellitus with hyperlipidemia (HCC) 10/10/2018   Hypothyroidism 10/10/2018    Conditions to be addressed/monitored per PCP order:   transportation  There are no care plans that you recently modified to display for this patient.   Follow up:  Patient agrees to Care Plan and Follow-up.  Plan: The  Managed Medicaid care management team will reach out to the patient again over the next 5 days.  Date/time of next scheduled Social Work care management/care coordination outreach:  08/19/22  Gus Puma, Kenard Gower, [facility]  Managed [facility] Social Worker [phone]

## 2022-08-18 ENCOUNTER — Telehealth: Payer: Self-pay

## 2022-08-18 NOTE — Patient Instructions (Signed)
Visit Information  Jennifer [name] was given information about Medicaid Managed Care team care coordination services as a part of their [facility] Medicaid benefit. Jennifer [name] verbally consented to engagement with the [facility] Managed Care team.   If you are experiencing a medical emergency, please call 911 or report to your local emergency department or urgent care.   If you have a non-emergency medical problem during routine business hours, please contact your provider's office and ask to speak with a nurse.   For questions related to your [facility] health plan, please call: [phone] or go here:[email or web address]  If you would like to schedule transportation through your [facility] plan, please call the following number at least 2 days in advance of your appointment: [phone].   You can also use the MTM portal or MTM mobile app to manage your rides. Reimbursement for transportation is available through [facility]! For the portal, please go to [email or web address]  Call the [facility] Crisis Line at [phone], at any time, 24 hours a day, 7 days a week. If you are in danger or need immediate medical attention call 911.  If you would like help to quit smoking, call 1-800-QUIT-NOW ((385)576-1379) OR Espaol: 1-855-Djelo-Ya (3-664-403-4742) o para ms informacin haga clic aqu or Text READY to 595-638 to register via text  Jennifer [name] - following are the goals we discussed in your visit today:   Goals Addressed   None      The  Patient                                              has been provided with contact information for the Managed Medicaid care management team and has been advised to call with any health related questions or concerns.   Jennifer [name], Jennifer [name], MHA [facility]  Managed Medicaid Social Worker [phone]   Following is a copy of your plan of care:  There are no care plans that you recently modified to display for this  patient.

## 2022-08-18 NOTE — Patient Outreach (Signed)
Medicaid Managed Care Social Work Note  08/18/2022 Name:  Jennifer [name] MRN:  [id number] DOB:  [birth year]  Jennifer [name] is an 32 y.o. year old female who is a primary patient of Leonard Downing.  The [facility] Managed Care Coordination team was consulted for assistance with:  Transportation Needs   Jennifer [name] was given information about Medicaid Managed Care Coordination team services today. Jennifer [name] Patient agreed to services and verbal consent obtained.  Engaged with patient  for by telephone forfollow up visit in response to referral for case management and/or care coordination services.   Assessments/Interventions:  Review of past medical history, allergies, medications, health status, including review of consultants reports, laboratory and other test data, was performed as part of comprehensive evaluation and provision of chronic care management services.  SDOH: (Social Determinant of Health) assessments and interventions performed: SDOH Interventions    Flowsheet Row Telephone from 08/11/2022 in [facility] from 04/05/2022 in [facility] Telephone from 08/19/2021 in [facility] ED to Hosp-Admission (Discharged) from 11/22/2020 in [facility]  SDOH Interventions      Food Insecurity Interventions -- -- Other (Comment)  [food pantries] Intervention Not Indicated  Housing Interventions -- Other (Comment)  [provided resources for financial assistance] Intervention Not Indicated Intervention Not Indicated  Transportation Interventions Payor Benefit  [referred to managed medicaid team] Payor Benefit Intervention Not Indicated Intervention Not Indicated  Financial Strain Interventions -- Other (Comment)  [provided resources for financial resources] -- Intervention Not Indicated      BSW completed a  telephone outreach with patient. BSW spoke with wellcare rep and she stated the preapproval process will require nothing different on patients end. Transportation will have to get an approval from [facility] for her appointments to [facility]. Patient states she does not have any in person appointments scheduled for now, they are all video visits. BSW encouraged patient to contact her if she gets an in person appointment scheduled Advanced Directives Status:  Not addressed in this encounter.  Care Plan                 Allergies  Allergen Reactions   Ketamine Other (See Comments)    In vegetative state for 15 minutes per pt   Erythromycin     unk   Fentanyl Nausea And Vomiting   Maitake Mushroom [M[place]ake] Itching    Itchy throat   Penicillin V Itching    Gi upset   Shellfish Allergy Other (See Comments)    Pt has never had shellfish but tested positive on allergy test. Pt states contrast in CT is okay   Morphine Itching and Rash   Penicillins Rash    Has patient had a PCN reaction causing immediate rash, facial/tongue/throat swelling, SOB or lightheadedness with hypotension: Y Has patient had a PCN reaction causing severe rash involving mucus membranes or skin necrosis: Y Has patient had a PCN reaction that required hospitalization: N Has patient had a PCN reaction occurring within the last 10 years: Y If all of the above answers are "NO", then may proceed with Cephalosporin use.    Prednisone Rash    Medications Reviewed Today     Reviewed by Kerry Hough, Paramedic (Certified Medical Assistant) on 08/11/22 at [id number]  Med List Status: <None>   Medication Order Taking? Sig Documenting Provider Last Dose Status Informant  albuterol (VENTOLIN HFA) 108 (  90 Base) MCG/ACT inhaler [id number] Yes Inhale 1-2 puffs into the lungs every 6 (six) hours as needed for wheezing or shortness of breath. [provider] Taking Active Self           Med Note (ADSIDE, Novella Rob   Wed Jan 26, 2022 10:08  AM)    aspirin EC 81 MG EC tablet [id number] Yes Take 1 tablet (81 mg total) by mouth daily. Swallow whole. Pennie Banter, DO Taking Active Self  carvedilol (COREG) 6.25 MG tablet [id number] Yes Take 1 tablet (6.25 mg total) by mouth 2 (two) times daily. [place], [place] Taking Active   CREON [id number] units CPEP capsule [id number] Yes Take 36,000 Units by mouth 3 (three) times daily. [provider] Taking Active Self  DULoxetine (CYMBALTA) 30 MG capsule [id number] Yes Take 30 mg by mouth at bedtime. [provider] Taking Active Self  ergocalciferol (VITAMIN D2) 1.25 MG (50000 UT) capsule [id number] Yes Take 50,000 Units by mouth every 7 (seven) days. [provider] Taking Active Self           Med Note Rosilyn Mings, Freddie Apley   Wed Jun 22, 2022  3:24 PM)    Evolocumab (REPATHA) 140 MG/ML SOSY [id number] Yes Inject 140 mg into the skin See admin instructions. Inject 140 mg subcutaneously every other Monday evening [provider] Taking Active Self           Med Note (ADSIDE, Novella Rob   Wed Jan 26, 2022 10:12 AM)    fenofibrate 160 MG tablet [id number] Yes Take 1 tablet (160 mg total) by mouth daily. Pennie Banter, DO Taking Active Self  glipiZIDE (GLUCOTROL) 5 MG tablet [id number] No Take 5 mg by mouth daily before breakfast.  Patient not taking: Reported on 08/11/2022   [provider] Not Taking Active   hydrALAZINE (APRESOLINE) 50 MG tablet [id number] Yes Take 1 tablet (50 mg total) by mouth every 8 (eight) hours. Hughie Closs, MD Taking Active Self  Insulin Pen Needle 32G X 4 MM MISC [id number] Yes 1 Device by Does not apply route QID. For use with insulin pens [name], MD Taking Active Self  insulin regular human CONCENTRATED (HUMULIN R U-500 KWIKPEN) 500 UNIT/ML KwikPen [id number] Yes Inject 120 Units into the skin 3 (three) times daily with meals. Simonne Martinet, NP Taking Active Self           Med Note Elijio Miles M   Wed Jun 22, 2022  3:24 PM)    ivabradine (CORLANOR) 7.5 MG TABS tablet [id number] Yes Take 1 tablet (7.5 mg total) by mouth 2 (two) times daily with a meal. [name], [name], MD Taking Active Self  levocetirizine (XYZAL) 5 MG tablet [id number] Yes Take 5 mg by mouth daily. [provider] Taking Active Self  levothyroxine (SYNTHROID) 125 MCG tablet [id number] Yes Take 1 tablet (125 mcg total) by mouth daily at 6 (six) AM. Pennie Banter, DO Taking Active Self  pantoprazole (PROTONIX) 40 MG tablet [id number] Yes Take 40 mg by mouth daily. [provider] Taking Active Self  pregabalin (LYRICA) 150 MG capsule [id number] Yes Take 300 mg by mouth at bedtime. [provider] Taking Active Self  prochlorperazine (COMPAZINE) 5 MG tablet [id number] Yes Take 5 mg by mouth daily. [provider] Taking Active Self           Med Note Odette Fraction   Wed Jun 22, 2022  3:24 PM)  rosuvastatin (CRESTOR) 40 MG tablet [id number] Yes Take 40 mg by mouth daily. [provider] Taking Active Self  sildenafil (REVATIO) 20 MG tablet [id number] Yes Take 1 tablet (20 mg total) by mouth 3 (three) times daily. [name], [name], MD Taking Active Self  sodium bicarbonate 650 MG tablet [id number] Yes Take 650 mg by mouth 2 (two) times daily. [provider] Taking Active Self  spironolactone (ALDACTONE) 25 MG tablet [id number] Yes Take 1 tablet (25 mg total) by mouth daily. Hughie Closs, MD Taking Active Self  torsemide (DEMADEX) 20 MG tablet [id number] Yes Take 40 mg by mouth daily. [provider] Taking Active   tretinoin (RETIN-A) 0.05 % cream [id number] Yes Apply 1 application topically at bedtime. [provider] Taking Active Self            Patient Active Problem List   Diagnosis Date Noted   UTI (urinary tract infection) due to Enterococcus 06/01/2022   E. coli UTI (urinary tract infection) 06/01/2022   DKA (diabetic ketoacidosis) (HCC) 06/01/2022    Pancreatitis 05/29/2022   Hypokalemia 03/23/2022   COVID 03/15/2022   COVID-19 virus infection 03/14/2022   Hyponatremia 03/14/2022   Positive D dimer 03/14/2022   Stage 3b chronic kidney disease (CKD) (HCC) 03/14/2022   Pulmonary hypertension (HCC) 02/06/2022   NASH (nonalcoholic steatohepatitis) 02/06/2022   Obesity (BMI 30-39.9) 02/06/2022   Heart failure (HCC) 12/03/2021   Acute on chronic systolic CHF (congestive heart failure) (HCC) 12/02/2021   Elevated troponin 12/02/2021   Pancytopenia (HCC) 12/02/2021   Amenorrhea 12/02/2021   Hyperosmolar hyperglycemic state (HHS) (HCC) 08/26/2021   Small intestinal bacterial overgrowth (SIBO) 08/26/2021   Lower extremity edema 08/26/2021   Hyperkalemia 08/14/2021   Hx of insulin dependent diabetes mellitus 08/14/2021   CKD (chronic kidney disease) stage 4, GFR 15-29 ml/min (HCC) 05/13/2021   Chest pain 03/10/2021   Acute on chronic combined systolic and diastolic CHF (congestive heart failure) (HCC) 02/09/2021   History of astrocytoma of brain 02/09/2021   Severe hyperglycemia due to diabetes mellitus (HCC) 01/25/2021   Diarrhea    Elevated transaminase level    Acute combined systolic and diastolic heart failure (HCC)    Nonischemic cardiomyopathy (HCC)    Acute decompensated heart failure (HCC)    Elevated liver enzymes    Acute renal failure superimposed on stage 3a chronic kidney disease (HCC) 11/26/2020   Intractable abdominal pain 11/23/2020   Chronic systolic CHF (congestive heart failure) (HCC) 11/23/2020   Essential hypertension 11/23/2020   Hypertriglyceridemia 11/23/2020   Prolonged QT interval 11/23/2020   Type 2 diabetes mellitus with hyperlipidemia (HCC) 10/10/2018   Hypothyroidism 10/10/2018    Conditions to be addressed/monitored per PCP order:   transportation  There are no care plans that you recently modified to display for this patient.   Follow up:  Patient agrees to Care Plan and Follow-up.  Plan:  The  Patient has been provided with contact information for the Managed Medicaid care management team and has been advised to call with any health related questions or concerns.  Abelino Derrick, MHA [facility]  Managed [facility] Social Worker [phone]

## 2022-08-19 ENCOUNTER — Ambulatory Visit: Payer: Medicaid Other

## 2022-08-27 DIAGNOSIS — Z419 Encounter for procedure for purposes other than remedying health state, unspecified: Secondary | ICD-10-CM | POA: Diagnosis not present

## 2022-08-29 ENCOUNTER — Telehealth (HOSPITAL_COMMUNITY): Payer: Self-pay

## 2022-08-30 ENCOUNTER — Telehealth (HOSPITAL_COMMUNITY): Payer: Self-pay

## 2022-08-30 ENCOUNTER — Other Ambulatory Visit: Payer: Self-pay

## 2022-08-30 DIAGNOSIS — E039 Hypothyroidism, unspecified: Secondary | ICD-10-CM | POA: Diagnosis not present

## 2022-08-30 DIAGNOSIS — R1030 Lower abdominal pain, unspecified: Secondary | ICD-10-CM | POA: Insufficient documentation

## 2022-08-30 DIAGNOSIS — I509 Heart failure, unspecified: Secondary | ICD-10-CM | POA: Insufficient documentation

## 2022-08-30 DIAGNOSIS — E119 Type 2 diabetes mellitus without complications: Secondary | ICD-10-CM | POA: Insufficient documentation

## 2022-08-30 DIAGNOSIS — I10 Essential (primary) hypertension: Secondary | ICD-10-CM | POA: Diagnosis not present

## 2022-08-30 DIAGNOSIS — Z86011 Personal history of benign neoplasm of the brain: Secondary | ICD-10-CM | POA: Insufficient documentation

## 2022-08-30 DIAGNOSIS — R000 Tachycardia, unspecified: Secondary | ICD-10-CM | POA: Diagnosis not present

## 2022-08-30 NOTE — Telephone Encounter (Signed)
Spoke to patient and she's going to send in a Cardiomems transmission today.

## 2022-08-31 ENCOUNTER — Encounter (HOSPITAL_BASED_OUTPATIENT_CLINIC_OR_DEPARTMENT_OTHER): Payer: Self-pay | Admitting: Emergency Medicine

## 2022-08-31 ENCOUNTER — Telehealth (HOSPITAL_COMMUNITY): Payer: Self-pay | Admitting: Adult Health

## 2022-08-31 ENCOUNTER — Emergency Department (HOSPITAL_BASED_OUTPATIENT_CLINIC_OR_DEPARTMENT_OTHER): Payer: Medicaid Other

## 2022-08-31 ENCOUNTER — Emergency Department (HOSPITAL_BASED_OUTPATIENT_CLINIC_OR_DEPARTMENT_OTHER)
Admission: EM | Admit: 2022-08-31 | Discharge: 2022-08-31 | Disposition: A | Discharge status: Home / Self Care | Payer: Medicaid Other | Attending: Emergency Medicine | Admitting: Emergency Medicine

## 2022-08-31 DIAGNOSIS — I700 Atherosclerosis of aorta: Secondary | ICD-10-CM | POA: Diagnosis not present

## 2022-08-31 DIAGNOSIS — R1030 Lower abdominal pain, unspecified: Secondary | ICD-10-CM

## 2022-08-31 DIAGNOSIS — R109 Unspecified abdominal pain: Secondary | ICD-10-CM | POA: Diagnosis not present

## 2022-08-31 LAB — CBC
HCT: 33.1 % — ABNORMAL LOW (ref 36.0–46.0)
Hemoglobin: 11.4 g/dL — ABNORMAL LOW (ref 12.0–15.0)
MCH: 30.2 pg (ref 26.0–34.0)
MCHC: 34.4 g/dL (ref 30.0–36.0)
MCV: 87.8 fL (ref 80.0–100.0)
Platelets: 214 10*3/uL (ref 150–400)
RBC: 3.77 MIL/uL — ABNORMAL LOW (ref 3.87–5.11)
RDW: 16.3 % — ABNORMAL HIGH (ref 11.5–15.5)
WBC: 7.6 10*3/uL (ref 4.0–10.5)
nRBC: 0 % (ref 0.0–0.2)

## 2022-08-31 LAB — COMPREHENSIVE METABOLIC PANEL
ALT: 90 U/L — ABNORMAL HIGH (ref 0–44)
AST: 94 U/L — ABNORMAL HIGH (ref 15–41)
Albumin: 4.7 g/dL (ref 3.5–5.0)
Alkaline Phosphatase: 128 U/L — ABNORMAL HIGH (ref 38–126)
Anion gap: 13 (ref 5–15)
BUN: 40 mg/dL — ABNORMAL HIGH (ref 6–20)
CO2: 22 mmol/L (ref 22–32)
Calcium: 9.9 mg/dL (ref 8.9–10.3)
Chloride: 100 mmol/L (ref 98–111)
Creatinine, Ser: 1.71 mg/dL — ABNORMAL HIGH (ref 0.44–1.00)
GFR, Estimated: 41 mL/min — ABNORMAL LOW (ref >60)
Glucose, Bld: 115 mg/dL — ABNORMAL HIGH (ref 70–99)
Potassium: 4.2 mmol/L (ref 3.5–5.1)
Sodium: 135 mmol/L (ref 135–145)
Total Bilirubin: 0.5 mg/dL (ref 0.3–1.2)
Total Protein: 8.1 g/dL (ref 6.5–8.1)

## 2022-08-31 LAB — URINALYSIS, ROUTINE W REFLEX MICROSCOPIC
Bilirubin Urine: NEGATIVE
Glucose, UA: NEGATIVE mg/dL
Ketones, ur: NEGATIVE mg/dL
Nitrite: NEGATIVE
Protein, ur: 100 mg/dL — AB
Specific Gravity, Urine: 1.016 (ref 1.005–1.030)
pH: 5.5 (ref 5.0–8.0)

## 2022-08-31 LAB — CBG MONITORING, ED
Glucose-Capillary: 57 mg/dL — ABNORMAL LOW (ref 70–99)
Glucose-Capillary: 134 mg/dL — ABNORMAL HIGH (ref 70–99)

## 2022-08-31 LAB — WET PREP, GENITAL
Sperm: NONE SEEN
Trich, Wet Prep: NONE SEEN
WBC, Wet Prep HPF POC: <10 (ref <10)
Yeast Wet Prep HPF POC: NONE SEEN

## 2022-08-31 LAB — PREGNANCY, URINE: Preg Test, Ur: NEGATIVE

## 2022-08-31 LAB — LIPASE, BLOOD: Lipase: 52 U/L — ABNORMAL HIGH (ref 11–51)

## 2022-08-31 MED ORDER — SODIUM CHLORIDE 0.9 % IV BOLUS
500.0000 mL | Freq: Once | INTRAVENOUS | Status: AC
  Administered 2022-08-31: 500 mL via INTRAVENOUS

## 2022-08-31 MED ORDER — SODIUM CHLORIDE 0.9 % IV BOLUS
1000.0000 mL | Freq: Once | INTRAVENOUS | Status: AC
  Administered 2022-08-31: 1000 mL via INTRAVENOUS

## 2022-08-31 MED ORDER — HYDROMORPHONE HCL 1 MG/ML IJ SOLN
0.5000 mg | Freq: Once | INTRAMUSCULAR | Status: AC
  Administered 2022-08-31: 0.5 mg via INTRAVENOUS
  Filled 2022-08-31: qty 1

## 2022-08-31 MED ORDER — IOHEXOL 300 MG/ML  SOLN
100.0000 mL | Freq: Once | INTRAMUSCULAR | Status: AC | PRN
  Administered 2022-08-31: 70 mL via INTRAVENOUS

## 2022-08-31 MED ORDER — LACTATED RINGERS IV BOLUS: 1000.0000 mL | Freq: Once | INTRAVENOUS | Status: DC

## 2022-08-31 NOTE — Telephone Encounter (Signed)
  Cardiomems Remote Monitoring  S/P Cardiomems Implant 12/06/2021   PAD Goal: 17 Most recent reading: 26 Suggestive of fluid accumulation.  Of note the last reading sent was 07/20/22.   Recommended changes: Presented to ED 08/31/22   I continue to review and analyze the patients PA pressures weekly (and more often as needed) to bring PA pressures within the optimal range.    Veleta Yamamoto NP-C  7:44 AM

## 2022-08-31 NOTE — ED Triage Notes (Signed)
Lower abdominal pain started around 8pm. Post intercourse. Described pain as pressure in lower abd. Also feels sharp pains in vagina area

## 2022-08-31 NOTE — ED Notes (Signed)
Po snack given for FSBS 57

## 2022-08-31 NOTE — ED Provider Notes (Signed)
[facility] EMERGENCY [facility] Emergency Department Provider Note MRN:  [id number]  Arrival date & time: 08/31/22     Chief Complaint   Abdominal Pain   History of Present Illness   Jennifer [name] is a 32 y.o. year-old female with a history of brain tumor, CHF, diabetes presenting to the ED with chief complaint of abdominal pain.  Severe lower abdominal pain after intercourse this evening.  Not going away.  Denies vaginal bleeding or discharge, no fever.  Review of Systems  A thorough review of systems was obtained and all systems are negative except as noted in the HPI and PMH.   Patient's Health History    Past Medical History:  Diagnosis Date   Brain tumor (HCC) 03/29/1995   astrocytoma   CHF (congestive heart failure) (HCC)    Cholesterosis    DM (diabetes mellitus) (HCC) 10/10/2018   Fatty liver    HTN (hypertension) 10/10/2018   Hypothyroidism 10/10/2018   Lipoprotein deficiency    Lung disease    longevity long term   Pancreatitis    Polycystic ovary syndrome     Past Surgical History:  Procedure Laterality Date   ABDOMINAL SURGERY     pt states during miscarriage got her intestine   BRAIN SURGERY     EYE MUSCLE SURGERY Right 03/28/2014   PRESSURE SENSOR/CARDIOMEMS N/A 12/06/2021   Procedure: PRESSURE SENSOR/CARDIOMEMS;  Surgeon: Dolores Patty, MD;  Location: [facility] INVASIVE CV LAB;  Service: Cardiovascular;  Laterality: N/A;   RIGHT HEART CATH N/A 08/05/2021   Procedure: RIGHT HEART CATH;  Surgeon: Dolores Patty, MD;  Location: [facility] INVASIVE CV LAB;  Service: Cardiovascular;  Laterality: N/A;   RIGHT HEART CATH N/A 12/06/2021   Procedure: RIGHT HEART CATH;  Surgeon: Dolores Patty, MD;  Location: [facility] INVASIVE CV LAB;  Service: Cardiovascular;  Laterality: N/A;   RIGHT/LEFT HEART CATH AND CORONARY ANGIOGRAPHY N/A 12/03/2020   Procedure: RIGHT/LEFT HEART CATH AND CORONARY ANGIOGRAPHY;  Surgeon: Yates Decamp, MD;  Location: [facility] INVASIVE CV LAB;   Service: Cardiovascular;  Laterality: N/A;   VENTRICULOSTOMY  03/28/1997    Family History  Problem Relation Age of Onset   Diabetes Mother    Hypertension Mother    Hyperlipidemia Mother    Thyroid disease Mother    Hypertension Father    Diabetes Father    Pancreatic cancer Paternal Aunt    Pancreatic cancer Paternal Uncle    Colon cancer Neg Hx    Esophageal cancer Neg Hx    Stomach cancer Neg Hx     Social History   Socioeconomic History   Marital status: Divorced    Spouse name: Not on file   Number of children: 0   Years of education: Not on file   Highest education level: Not on file  Occupational History   Occupation: [employer]  Tobacco Use   Smoking status: Never   Smokeless tobacco: Never  Vaping Use   Vaping Use: Never used  Substance and Sexual Activity   Alcohol use: Never   Drug use: Never   Sexual activity: Yes  Other Topics Concern   Not on file  Social History Narrative   Not on file   Social Determinants of Health   Financial Resource Strain: High Risk (04/05/2022)   Overall Financial Resource Strain (CARDIA)    Difficulty of Paying Living Expenses: Hard  Food Insecurity: No Food Insecurity (08/12/2022)   Hunger Vital Sign    Worried About Programme researcher, broadcasting/film/video in  the Last Year: Never true    Ran Out of Food in the Last Year: Never true  Transportation Needs: Unmet Transportation Needs (08/11/2022)   PRAPARE - Administrator, Civil Service (Medical): Yes    Lack of Transportation (Non-Medical): Yes  Physical Activity: Sufficiently Active (12/10/2021)   Exercise Vital Sign    Days of Exercise per Week: 3 days    Minutes of Exercise per Session: 60 min  Stress: No Stress Concern Present (12/10/2021)   [employer] of Occupational Health - Occupational Stress Questionnaire    Feeling of Stress : Not at all  Social Connections: Socially Integrated (12/10/2021)   Social Connection and Isolation Panel [NHANES]    Frequency of  Communication with Friends and Family: More than three times a week    Frequency of Social Gatherings with Friends and Family: More than three times a week    Attends Religious Services: More than 4 times per year    Active Member of [employer] or Organizations: No    Attends Engineer, structural: More than 4 times per year    Marital Status: Married  Catering manager Violence: Not At Risk (08/12/2022)   Humiliation, Afraid, Rape, and Kick questionnaire    Fear of Current or Ex-Partner: No    Emotionally Abused: No    Physically Abused: No    Sexually Abused: No     Physical Exam   Vitals:   08/31/22 0339 08/31/22 0400  BP:  (!) 164/103  Pulse: (!) 119 (!) 128  Resp: (!) 30 17  Temp:    SpO2: 97% 93%    CONSTITUTIONAL: Well-appearing, NAD NEURO/PSYCH:  Alert and oriented x 3, no focal deficits EYES:  eyes equal and reactive ENT/NECK:  no LAD, no JVD CARDIO: Regular rate, well-perfused, normal S1 and S2 PULM:  CTAB no wheezing or rhonchi GI/GU:  non-distended, moderate lower abdominal tenderness MSK/SPINE:  No gross deformities, no edema SKIN:  no rash, atraumatic   *Additional and/or pertinent findings included in MDM below  Diagnostic and Interventional Summary    EKG Interpretation  Date/Time:  Wednesday August 31 2022 02:36:37 EDT Ventricular Rate:  135 PR Interval:  112 QRS Duration: 89 QT Interval:  314 QTC Calculation: 471 R Axis:   16 Text Interpretation: Sinus tachycardia LVH with secondary repolarization abnormality Anterior ST elevation, probably due to LVH Confirmed by Kennis Carina [phone]) on 08/31/2022 4:09:59 AM       Labs Reviewed  LIPASE, BLOOD - Abnormal; Notable for the following components:      Result Value   Lipase 52 (*)    All other components within normal limits  COMPREHENSIVE METABOLIC PANEL - Abnormal; Notable for the following components:   Glucose, Bld 115 (*)    BUN 40 (*)    Creatinine, Ser 1.71 (*)    AST 94 (*)    ALT 90  (*)    Alkaline Phosphatase 128 (*)    GFR, Estimated 41 (*)    All other components within normal limits  CBC - Abnormal; Notable for the following components:   RBC 3.77 (*)    Hemoglobin 11.4 (*)    HCT 33.1 (*)    RDW 16.3 (*)    All other components within normal limits  URINALYSIS, ROUTINE W REFLEX MICROSCOPIC - Abnormal; Notable for the following components:   Hgb urine dipstick TRACE (*)    Protein, ur 100 (*)    Leukocytes,Ua SMALL (*)    Bacteria, UA  FEW (*)    All other components within normal limits  CBG MONITORING, ED - Abnormal; Notable for the following components:   Glucose-Capillary 57 (*)    All other components within normal limits  CBG MONITORING, ED - Abnormal; Notable for the following components:   Glucose-Capillary 134 (*)    All other components within normal limits  WET PREP, GENITAL  PREGNANCY, URINE  GC/CHLAMYDIA PROBE AMP ([place]) NOT AT [facility]    CT ABDOMEN PELVIS W CONTRAST  Final Result      Medications  sodium chloride 0.9 % bolus 1,000 mL (0 mLs Intravenous Stopped 08/31/22 0228)  HYDROmorphone (DILAUDID) injection 0.5 mg (0.5 mg Intravenous Given 08/31/22 0053)  iohexol (OMNIPAQUE) 300 MG/ML solution 100 mL (70 mLs Intravenous Contrast Given 08/31/22 0151)  sodium chloride 0.9 % bolus 500 mL (0 mLs Intravenous Stopped 08/31/22 0411)  HYDROmorphone (DILAUDID) injection 0.5 mg (0.5 mg Intravenous Given 08/31/22 0244)     Procedures  /  Critical Care Procedures  ED Course and Medical Decision Making  Initial Impression and Ddx Differential diagnosis includes blunt cervical trauma, perforated viscus, appendicitis, diverticulitis, uterine cramping.  Past medical/surgical history that increases complexity of ED encounter: History of brain cancer, chemotherapy, CHF  Interpretation of Diagnostics I personally reviewed the EKG and my interpretation is as follows: Sinus tachycardia  Labs overall reassuring with no significant blood count or  electrolyte disturbance.  CT imaging is largely normal.  Possible very small splenic infarct of unclear chronicity, does not really correlate with patient's lower pelvic pain after intercourse.  Patient Reassessment and Ultimate Disposition/Management     Patient feeling better, pelvic exam overall reassuring with no lymphadenopathy, no bleeding, no discharge, no cervical motion tenderness or adnexal tenderness.  Patient's heart rate has improved down to the 110s which seems to be her baseline.  Appropriate for discharge.  Patient management required discussion with the following services or consulting groups:  None  Complexity of Problems Addressed Acute illness or injury that poses threat of life of bodily function  Additional Data Reviewed and Analyzed Further history obtained from: Prior labs/imaging results  Additional Factors Impacting ED Encounter Risk Use of parenteral controlled substances  Elmer Sow. Pilar Plate, MD [facility] Emergency Medicine [facility] [email or web address]  Final Clinical Impressions(s) / ED Diagnoses     ICD-10-CM   1. Lower abdominal pain  R10.30       ED Discharge Orders     None        Discharge Instructions Discussed with and Provided to Patient:     Discharge Instructions      You were evaluated in the Emergency Department and after careful evaluation, we did not find any emergent condition requiring admission or further testing in the hospital.  Your exam/testing today is overall reassuring.  Recommend follow-up with your OB/GYN if symptoms continue.  Please return to the Emergency Department if you experience any worsening of your condition.   Thank you for allowing [place] to be a part of your care.       Sabas Sous, MD 08/31/22 [phone]

## 2022-08-31 NOTE — Discharge Instructions (Signed)
You were evaluated in the Emergency Department and after careful evaluation, we did not find any emergent condition requiring admission or further testing in the hospital.  Your exam/testing today is overall reassuring.  Recommend follow-up with your OB/GYN if symptoms continue.  Please return to the Emergency Department if you experience any worsening of your condition.   Thank you for allowing [place] to be a part of your care.

## 2022-09-01 LAB — GC/CHLAMYDIA PROBE AMP (~~LOC~~) NOT AT ARMC
Chlamydia: NEGATIVE
Comment: NEGATIVE
Comment: NORMAL
Neisseria Gonorrhea: NEGATIVE

## 2022-09-02 DIAGNOSIS — Z0000 Encounter for general adult medical examination without abnormal findings: Secondary | ICD-10-CM | POA: Diagnosis not present

## 2022-09-02 DIAGNOSIS — I509 Heart failure, unspecified: Secondary | ICD-10-CM | POA: Diagnosis not present

## 2022-09-02 DIAGNOSIS — Z7689 Persons encountering health services in other specified circumstances: Secondary | ICD-10-CM | POA: Diagnosis not present

## 2022-09-02 DIAGNOSIS — E1069 Type 1 diabetes mellitus with other specified complication: Secondary | ICD-10-CM | POA: Diagnosis not present

## 2022-09-02 DIAGNOSIS — E1159 Type 2 diabetes mellitus with other circulatory complications: Secondary | ICD-10-CM | POA: Diagnosis not present

## 2022-09-02 DIAGNOSIS — R5383 Other fatigue: Secondary | ICD-10-CM | POA: Diagnosis not present

## 2022-09-02 DIAGNOSIS — I152 Hypertension secondary to endocrine disorders: Secondary | ICD-10-CM | POA: Diagnosis not present

## 2022-09-05 ENCOUNTER — Encounter (HOSPITAL_BASED_OUTPATIENT_CLINIC_OR_DEPARTMENT_OTHER): Payer: Self-pay

## 2022-09-05 ENCOUNTER — Emergency Department (HOSPITAL_BASED_OUTPATIENT_CLINIC_OR_DEPARTMENT_OTHER): Payer: Medicaid Other

## 2022-09-05 ENCOUNTER — Other Ambulatory Visit: Payer: Self-pay

## 2022-09-05 ENCOUNTER — Emergency Department (HOSPITAL_BASED_OUTPATIENT_CLINIC_OR_DEPARTMENT_OTHER)
Admission: EM | Admit: 2022-09-05 | Discharge: 2022-09-06 | Disposition: A | Discharge status: Home / Self Care | Payer: Medicaid Other | Attending: Emergency Medicine | Admitting: Emergency Medicine

## 2022-09-05 DIAGNOSIS — M25572 Pain in left ankle and joints of left foot: Secondary | ICD-10-CM | POA: Diagnosis not present

## 2022-09-05 DIAGNOSIS — E039 Hypothyroidism, unspecified: Secondary | ICD-10-CM | POA: Insufficient documentation

## 2022-09-05 DIAGNOSIS — E1165 Type 2 diabetes mellitus with hyperglycemia: Secondary | ICD-10-CM | POA: Insufficient documentation

## 2022-09-05 DIAGNOSIS — R519 Headache, unspecified: Secondary | ICD-10-CM | POA: Insufficient documentation

## 2022-09-05 DIAGNOSIS — Z794 Long term (current) use of insulin: Secondary | ICD-10-CM | POA: Insufficient documentation

## 2022-09-05 DIAGNOSIS — R110 Nausea: Secondary | ICD-10-CM | POA: Diagnosis not present

## 2022-09-05 DIAGNOSIS — M542 Cervicalgia: Secondary | ICD-10-CM | POA: Insufficient documentation

## 2022-09-05 DIAGNOSIS — M79621 Pain in right upper arm: Secondary | ICD-10-CM | POA: Diagnosis not present

## 2022-09-05 DIAGNOSIS — I509 Heart failure, unspecified: Secondary | ICD-10-CM | POA: Insufficient documentation

## 2022-09-05 DIAGNOSIS — M79604 Pain in right leg: Secondary | ICD-10-CM | POA: Diagnosis not present

## 2022-09-05 DIAGNOSIS — I110 Hypertensive heart disease with heart failure: Secondary | ICD-10-CM | POA: Insufficient documentation

## 2022-09-05 DIAGNOSIS — Z0471 Encounter for examination and observation following alleged adult physical abuse: Secondary | ICD-10-CM | POA: Diagnosis not present

## 2022-09-05 LAB — CBG MONITORING, ED: Glucose-Capillary: 480 mg/dL — ABNORMAL HIGH (ref 70–99)

## 2022-09-05 MED ORDER — LACTATED RINGERS IV BOLUS
500.0000 mL | Freq: Once | INTRAVENOUS | Status: AC
  Administered 2022-09-05: 500 mL via INTRAVENOUS

## 2022-09-05 MED ORDER — HYDROMORPHONE HCL 1 MG/ML IJ SOLN
0.5000 mg | Freq: Once | INTRAMUSCULAR | Status: AC
  Administered 2022-09-05: 0.5 mg via INTRAVENOUS
  Filled 2022-09-05: qty 1
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
## 2022-09-05 NOTE — ED Notes (Signed)
Pt to CT by wheelchair at this time. 
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
## 2022-09-05 NOTE — ED Notes (Signed)
Pt ambulatory to room with steady gait. Patient resting quietly in stretcher, respirations even, unlabored, no acute distress noted.

## 2022-09-06 LAB — CBC
HCT: 31.7 % — ABNORMAL LOW (ref 36.0–46.0)
Hemoglobin: 11 g/dL — ABNORMAL LOW (ref 12.0–15.0)
MCH: 29.3 pg (ref 26.0–34.0)
MCHC: 34.7 g/dL (ref 30.0–36.0)
MCV: 84.5 fL (ref 80.0–100.0)
Platelets: 214 10*3/uL (ref 150–400)
RBC: 3.75 MIL/uL — ABNORMAL LOW (ref 3.87–5.11)
RDW: 15.1 % (ref 11.5–15.5)
WBC: 7.3 10*3/uL (ref 4.0–10.5)
nRBC: 0 % (ref 0.0–0.2)

## 2022-09-06 LAB — CBG MONITORING, ED: Glucose-Capillary: 468 mg/dL — ABNORMAL HIGH (ref 70–99)

## 2022-09-06 MED ORDER — INSULIN ASPART 100 UNIT/ML IV SOLN
5.0000 [IU] | Freq: Once | INTRAVENOUS | Status: AC
  Administered 2022-09-06: 5 [IU] via INTRAVENOUS

## 2022-09-06 NOTE — Discharge Instructions (Signed)
You were evaluated in the Emergency Department and after careful evaluation, we did not find any emergent condition requiring admission or further testing in the hospital.  Your exam/testing today is overall reassuring.  Recommend follow-up with your primary care doctor.  Please return to the Emergency Department if you experience any worsening of your condition.   Thank you for allowing [place] to be a part of your care.

## 2022-09-06 NOTE — ED Notes (Signed)
Reviewed AVS with patient, patient expressed understanding of directions, denies further questions at this time. 

## 2022-09-06 NOTE — ED Notes (Addendum)
Patient resting quietly in stretcher, respirations even, unlabored, no acute distress noted. Denies needs at this time.  

## 2022-09-07 ENCOUNTER — Emergency Department (HOSPITAL_COMMUNITY): Payer: Medicaid Other

## 2022-09-07 ENCOUNTER — Inpatient Hospital Stay (HOSPITAL_COMMUNITY)
Admission: EM | Admit: 2022-09-07 | Discharge: 2022-09-13 | DRG: 638 | Disposition: A | Discharge status: Home / Self Care | Payer: Medicaid Other | Attending: Internal Medicine | Admitting: Internal Medicine

## 2022-09-07 ENCOUNTER — Encounter (HOSPITAL_COMMUNITY): Payer: Self-pay

## 2022-09-07 ENCOUNTER — Other Ambulatory Visit: Payer: Self-pay

## 2022-09-07 DIAGNOSIS — R220 Localized swelling, mass and lump, head: Secondary | ICD-10-CM | POA: Diagnosis not present

## 2022-09-07 DIAGNOSIS — R519 Headache, unspecified: Secondary | ICD-10-CM | POA: Diagnosis not present

## 2022-09-07 DIAGNOSIS — Z881 Allergy status to other antibiotic agents status: Secondary | ICD-10-CM

## 2022-09-07 DIAGNOSIS — E039 Hypothyroidism, unspecified: Secondary | ICD-10-CM | POA: Diagnosis present

## 2022-09-07 DIAGNOSIS — M25571 Pain in right ankle and joints of right foot: Secondary | ICD-10-CM | POA: Diagnosis present

## 2022-09-07 DIAGNOSIS — R739 Hyperglycemia, unspecified: Principal | ICD-10-CM

## 2022-09-07 DIAGNOSIS — Z8349 Family history of other endocrine, nutritional and metabolic diseases: Secondary | ICD-10-CM

## 2022-09-07 DIAGNOSIS — I10 Essential (primary) hypertension: Secondary | ICD-10-CM | POA: Diagnosis present

## 2022-09-07 DIAGNOSIS — Z79899 Other long term (current) drug therapy: Secondary | ICD-10-CM

## 2022-09-07 DIAGNOSIS — Z8249 Family history of ischemic heart disease and other diseases of the circulatory system: Secondary | ICD-10-CM

## 2022-09-07 DIAGNOSIS — Z888 Allergy status to other drugs, medicaments and biological substances status: Secondary | ICD-10-CM

## 2022-09-07 DIAGNOSIS — E1169 Type 2 diabetes mellitus with other specified complication: Secondary | ICD-10-CM | POA: Diagnosis present

## 2022-09-07 DIAGNOSIS — Z794 Long term (current) use of insulin: Secondary | ICD-10-CM

## 2022-09-07 DIAGNOSIS — Z79890 Hormone replacement therapy (postmenopausal): Secondary | ICD-10-CM

## 2022-09-07 DIAGNOSIS — Z880 Allergy status to penicillin: Secondary | ICD-10-CM

## 2022-09-07 DIAGNOSIS — Z833 Family history of diabetes mellitus: Secondary | ICD-10-CM

## 2022-09-07 DIAGNOSIS — E1110 Type 2 diabetes mellitus with ketoacidosis without coma: Principal | ICD-10-CM | POA: Diagnosis present

## 2022-09-07 DIAGNOSIS — Z83438 Family history of other disorder of lipoprotein metabolism and other lipidemia: Secondary | ICD-10-CM

## 2022-09-07 DIAGNOSIS — Z23 Encounter for immunization: Secondary | ICD-10-CM

## 2022-09-07 DIAGNOSIS — R7401 Elevation of levels of liver transaminase levels: Secondary | ICD-10-CM | POA: Diagnosis present

## 2022-09-07 DIAGNOSIS — E1310 Other specified diabetes mellitus with ketoacidosis without coma: Secondary | ICD-10-CM | POA: Diagnosis not present

## 2022-09-07 DIAGNOSIS — E875 Hyperkalemia: Secondary | ICD-10-CM | POA: Diagnosis present

## 2022-09-07 DIAGNOSIS — Z885 Allergy status to narcotic agent status: Secondary | ICD-10-CM

## 2022-09-07 DIAGNOSIS — J320 Chronic maxillary sinusitis: Secondary | ICD-10-CM | POA: Diagnosis present

## 2022-09-07 DIAGNOSIS — Z91013 Allergy to seafood: Secondary | ICD-10-CM

## 2022-09-07 DIAGNOSIS — E0910 Drug or chemical induced diabetes mellitus with ketoacidosis without coma: Secondary | ICD-10-CM

## 2022-09-07 DIAGNOSIS — R9431 Abnormal electrocardiogram [ECG] [EKG]: Secondary | ICD-10-CM | POA: Diagnosis present

## 2022-09-07 DIAGNOSIS — N1832 Chronic kidney disease, stage 3b: Secondary | ICD-10-CM | POA: Diagnosis present

## 2022-09-07 DIAGNOSIS — I272 Pulmonary hypertension, unspecified: Secondary | ICD-10-CM | POA: Diagnosis present

## 2022-09-07 DIAGNOSIS — K760 Fatty (change of) liver, not elsewhere classified: Secondary | ICD-10-CM | POA: Diagnosis present

## 2022-09-07 DIAGNOSIS — Z85841 Personal history of malignant neoplasm of brain: Secondary | ICD-10-CM

## 2022-09-07 DIAGNOSIS — E1122 Type 2 diabetes mellitus with diabetic chronic kidney disease: Secondary | ICD-10-CM | POA: Diagnosis present

## 2022-09-07 DIAGNOSIS — I130 Hypertensive heart and chronic kidney disease with heart failure and stage 1 through stage 4 chronic kidney disease, or unspecified chronic kidney disease: Secondary | ICD-10-CM | POA: Diagnosis present

## 2022-09-07 DIAGNOSIS — D649 Anemia, unspecified: Secondary | ICD-10-CM | POA: Diagnosis present

## 2022-09-07 DIAGNOSIS — E781 Pure hyperglyceridemia: Secondary | ICD-10-CM | POA: Diagnosis present

## 2022-09-07 DIAGNOSIS — G4489 Other headache syndrome: Secondary | ICD-10-CM | POA: Diagnosis not present

## 2022-09-07 DIAGNOSIS — S0990XA Unspecified injury of head, initial encounter: Secondary | ICD-10-CM | POA: Diagnosis not present

## 2022-09-07 DIAGNOSIS — E1165 Type 2 diabetes mellitus with hyperglycemia: Secondary | ICD-10-CM | POA: Diagnosis not present

## 2022-09-07 DIAGNOSIS — I5022 Chronic systolic (congestive) heart failure: Secondary | ICD-10-CM | POA: Diagnosis present

## 2022-09-07 DIAGNOSIS — E871 Hypo-osmolality and hyponatremia: Secondary | ICD-10-CM | POA: Diagnosis present

## 2022-09-07 DIAGNOSIS — E860 Dehydration: Secondary | ICD-10-CM | POA: Diagnosis present

## 2022-09-07 DIAGNOSIS — Z800 Family history of malignant neoplasm of digestive organs: Secondary | ICD-10-CM

## 2022-09-07 DIAGNOSIS — Z7982 Long term (current) use of aspirin: Secondary | ICD-10-CM

## 2022-09-07 LAB — URINALYSIS, ROUTINE W REFLEX MICROSCOPIC
Bacteria, UA: NONE SEEN
Bilirubin Urine: NEGATIVE
Glucose, UA: >=500 mg/dL — AB
Hgb urine dipstick: NEGATIVE
Ketones, ur: NEGATIVE mg/dL
Leukocytes,Ua: NEGATIVE
Nitrite: NEGATIVE
Protein, ur: NEGATIVE mg/dL
Specific Gravity, Urine: 1.011 (ref 1.005–1.030)
pH: 5 (ref 5.0–8.0)

## 2022-09-07 LAB — CBC WITH DIFFERENTIAL/PLATELET
Abs Immature Granulocytes: 0.02 10*3/uL (ref 0.00–0.07)
Basophils Absolute: 0.1 10*3/uL (ref 0.0–0.1)
Basophils Relative: 1 %
Eosinophils Absolute: 0.1 10*3/uL (ref 0.0–0.5)
Eosinophils Relative: 1 %
HCT: 32 % — ABNORMAL LOW (ref 36.0–46.0)
Hemoglobin: 11 g/dL — ABNORMAL LOW (ref 12.0–15.0)
Immature Granulocytes: 0 %
Lymphocytes Relative: 57 %
Lymphs Abs: 3.8 10*3/uL (ref 0.7–4.0)
MCH: 31.4 pg (ref 26.0–34.0)
MCHC: 34.4 g/dL (ref 30.0–36.0)
MCV: 91.4 fL (ref 80.0–100.0)
Monocytes Absolute: 0.5 10*3/uL (ref 0.1–1.0)
Monocytes Relative: 7 %
Neutro Abs: 2.3 10*3/uL (ref 1.7–7.7)
Neutrophils Relative %: 34 %
Platelets: 240 10*3/uL (ref 150–400)
RBC: 3.5 MIL/uL — ABNORMAL LOW (ref 3.87–5.11)
RDW: 15.6 % — ABNORMAL HIGH (ref 11.5–15.5)
WBC: 6.7 10*3/uL (ref 4.0–10.5)
nRBC: 0 % (ref 0.0–0.2)

## 2022-09-07 LAB — BASIC METABOLIC PANEL
Anion gap: 15 (ref 5–15)
Anion gap: 12 (ref 5–15)
Anion gap: 16 — ABNORMAL HIGH (ref 5–15)
BUN: 50 mg/dL — ABNORMAL HIGH (ref 6–20)
BUN: 49 mg/dL — ABNORMAL HIGH (ref 6–20)
BUN: 47 mg/dL — ABNORMAL HIGH (ref 6–20)
CO2: 16 mmol/L — ABNORMAL LOW (ref 22–32)
CO2: 20 mmol/L — ABNORMAL LOW (ref 22–32)
CO2: 19 mmol/L — ABNORMAL LOW (ref 22–32)
Calcium: 9.2 mg/dL (ref 8.9–10.3)
Calcium: 9.2 mg/dL (ref 8.9–10.3)
Calcium: 9.6 mg/dL (ref 8.9–10.3)
Chloride: 99 mmol/L (ref 98–111)
Chloride: 101 mmol/L (ref 98–111)
Chloride: 97 mmol/L — ABNORMAL LOW (ref 98–111)
Creatinine, Ser: 1.88 mg/dL — ABNORMAL HIGH (ref 0.44–1.00)
Creatinine, Ser: 1.76 mg/dL — ABNORMAL HIGH (ref 0.44–1.00)
Creatinine, Ser: 1.8 mg/dL — ABNORMAL HIGH (ref 0.44–1.00)
GFR, Estimated: 36 mL/min — ABNORMAL LOW (ref >60)
GFR, Estimated: 39 mL/min — ABNORMAL LOW (ref >60)
GFR, Estimated: 38 mL/min — ABNORMAL LOW (ref >60)
Glucose, Bld: 401 mg/dL — ABNORMAL HIGH (ref 70–99)
Glucose, Bld: 154 mg/dL — ABNORMAL HIGH (ref 70–99)
Glucose, Bld: 155 mg/dL — ABNORMAL HIGH (ref 70–99)
Potassium: 6.7 mmol/L (ref 3.5–5.1)
Potassium: 5.1 mmol/L (ref 3.5–5.1)
Potassium: 4.6 mmol/L (ref 3.5–5.1)
Sodium: 130 mmol/L — ABNORMAL LOW (ref 135–145)
Sodium: 133 mmol/L — ABNORMAL LOW (ref 135–145)
Sodium: 132 mmol/L — ABNORMAL LOW (ref 135–145)

## 2022-09-07 LAB — GLUCOSE, CAPILLARY
Glucose-Capillary: 151 mg/dL — ABNORMAL HIGH (ref 70–99)
Glucose-Capillary: 182 mg/dL — ABNORMAL HIGH (ref 70–99)
Glucose-Capillary: 156 mg/dL — ABNORMAL HIGH (ref 70–99)

## 2022-09-07 LAB — CBG MONITORING, ED
Glucose-Capillary: >600 mg/dL (ref 70–99)
Glucose-Capillary: 528 mg/dL (ref 70–99)
Glucose-Capillary: 436 mg/dL — ABNORMAL HIGH (ref 70–99)
Glucose-Capillary: 371 mg/dL — ABNORMAL HIGH (ref 70–99)
Glucose-Capillary: 194 mg/dL — ABNORMAL HIGH (ref 70–99)
Glucose-Capillary: 138 mg/dL — ABNORMAL HIGH (ref 70–99)
Glucose-Capillary: 124 mg/dL — ABNORMAL HIGH (ref 70–99)
Glucose-Capillary: 147 mg/dL — ABNORMAL HIGH (ref 70–99)
Glucose-Capillary: 148 mg/dL — ABNORMAL HIGH (ref 70–99)
Glucose-Capillary: 152 mg/dL — ABNORMAL HIGH (ref 70–99)

## 2022-09-07 LAB — COMPREHENSIVE METABOLIC PANEL
ALT: 64 U/L — ABNORMAL HIGH (ref 0–44)
AST: 108 U/L — ABNORMAL HIGH (ref 15–41)
Albumin: 3.8 g/dL (ref 3.5–5.0)
Alkaline Phosphatase: 115 U/L (ref 38–126)
Anion gap: 16 — ABNORMAL HIGH (ref 5–15)
BUN: 53 mg/dL — ABNORMAL HIGH (ref 6–20)
CO2: 17 mmol/L — ABNORMAL LOW (ref 22–32)
Calcium: 9.1 mg/dL (ref 8.9–10.3)
Chloride: 95 mmol/L — ABNORMAL LOW (ref 98–111)
Creatinine, Ser: 1.96 mg/dL — ABNORMAL HIGH (ref 0.44–1.00)
GFR, Estimated: 34 mL/min — ABNORMAL LOW (ref >60)
Glucose, Bld: 550 mg/dL (ref 70–99)
Sodium: 128 mmol/L — ABNORMAL LOW (ref 135–145)
Total Bilirubin: 3.5 mg/dL — ABNORMAL HIGH (ref 0.3–1.2)
Total Protein: UNDETERMINED g/dL (ref 6.5–8.1)

## 2022-09-07 LAB — BLOOD GAS, VENOUS
Acid-base deficit: 3.2 mmol/L — ABNORMAL HIGH (ref 0.0–2.0)
Bicarbonate: 23.6 mmol/L (ref 20.0–28.0)
O2 Saturation: 57.2 %
Patient temperature: 37
pCO2, Ven: 48 mmHg (ref 44–60)
pH, Ven: 7.3 (ref 7.25–7.43)
pO2, Ven: 34 mmHg (ref 32–45)

## 2022-09-07 LAB — I-STAT BETA HCG BLOOD, ED (MC, WL, AP ONLY): I-stat hCG, quantitative: <5 m[IU]/mL (ref <5)

## 2022-09-07 LAB — LIPASE, BLOOD: Lipase: 41 U/L (ref 11–51)

## 2022-09-07 LAB — TRIGLYCERIDES: Triglycerides: >5000 mg/dL — ABNORMAL HIGH (ref <150)

## 2022-09-07 LAB — MRSA NEXT GEN BY PCR, NASAL: MRSA by PCR Next Gen: NOT DETECTED

## 2022-09-07 LAB — BETA-HYDROXYBUTYRIC ACID
Beta-Hydroxybutyric Acid: 0.12 mmol/L (ref 0.05–0.27)
Beta-Hydroxybutyric Acid: 0.11 mmol/L (ref 0.05–0.27)

## 2022-09-07 MED ORDER — CHLORHEXIDINE GLUCONATE CLOTH 2 % EX PADS
6.0000 | MEDICATED_PAD | Freq: Every day | CUTANEOUS | Status: DC
  Administered 2022-09-07 – 2022-09-10 (×3): 6 via TOPICAL

## 2022-09-07 MED ORDER — SILDENAFIL CITRATE 20 MG PO TABS
20.0000 mg | ORAL_TABLET | Freq: Three times a day (TID) | ORAL | Status: DC
  Administered 2022-09-07 – 2022-09-13 (×19): 20 mg via ORAL
  Filled 2022-09-07 (×22): qty 1

## 2022-09-07 MED ORDER — HYDROMORPHONE HCL 1 MG/ML IJ SOLN
0.5000 mg | Freq: Once | INTRAMUSCULAR | Status: AC
  Administered 2022-09-07: 0.5 mg via INTRAVENOUS
  Filled 2022-09-07: qty 1

## 2022-09-07 MED ORDER — PREGABALIN 100 MG PO CAPS
300.0000 mg | ORAL_CAPSULE | Freq: Every day | ORAL | Status: DC
  Administered 2022-09-07 – 2022-09-10 (×4): 300 mg via ORAL
  Filled 2022-09-07 (×4): qty 3

## 2022-09-07 MED ORDER — IVABRADINE HCL 7.5 MG PO TABS
7.5000 mg | ORAL_TABLET | Freq: Two times a day (BID) | ORAL | Status: AC
  Administered 2022-09-07 – 2022-09-10 (×6): 7.5 mg via ORAL
  Filled 2022-09-07 (×6): qty 1

## 2022-09-07 MED ORDER — DEXTROSE IN LACTATED RINGERS 5 % IV SOLN: INTRAVENOUS | Status: DC

## 2022-09-07 MED ORDER — CARVEDILOL 6.25 MG PO TABS
6.2500 mg | ORAL_TABLET | Freq: Two times a day (BID) | ORAL | Status: DC
  Administered 2022-09-07 – 2022-09-13 (×10): 6.25 mg via ORAL
  Filled 2022-09-07 (×11): qty 1

## 2022-09-07 MED ORDER — DIPHENHYDRAMINE HCL 50 MG/ML IJ SOLN
12.5000 mg | Freq: Once | INTRAMUSCULAR | Status: AC
  Administered 2022-09-07: 12.5 mg via INTRAVENOUS
  Filled 2022-09-07: qty 1

## 2022-09-07 MED ORDER — PROCHLORPERAZINE MALEATE 10 MG PO TABS
5.0000 mg | ORAL_TABLET | Freq: Every day | ORAL | Status: DC
  Administered 2022-09-07 – 2022-09-12 (×6): 5 mg via ORAL
  Filled 2022-09-07 (×6): qty 1

## 2022-09-07 MED ORDER — LACTATED RINGERS IV BOLUS
500.0000 mL | Freq: Once | INTRAVENOUS | Status: AC
  Administered 2022-09-07: 500 mL via INTRAVENOUS

## 2022-09-07 MED ORDER — DIPHENHYDRAMINE HCL 50 MG/ML IJ SOLN: 25.0000 mg | Freq: Four times a day (QID) | INTRAMUSCULAR | Status: DC | PRN

## 2022-09-07 MED ORDER — DIPHENHYDRAMINE HCL 50 MG/ML IJ SOLN
12.5000 mg | INTRAMUSCULAR | Status: AC | PRN
  Administered 2022-09-08: 12.5 mg via INTRAVENOUS
  Filled 2022-09-07 (×2): qty 1

## 2022-09-07 MED ORDER — TORSEMIDE 20 MG PO TABS
40.0000 mg | ORAL_TABLET | Freq: Every day | ORAL | Status: DC
  Administered 2022-09-07 – 2022-09-11 (×5): 40 mg via ORAL
  Filled 2022-09-07 (×6): qty 2

## 2022-09-07 MED ORDER — ACETAMINOPHEN 325 MG PO TABS
650.0000 mg | ORAL_TABLET | Freq: Four times a day (QID) | ORAL | Status: DC | PRN
  Administered 2022-09-11: 650 mg via ORAL
  Filled 2022-09-07: qty 2

## 2022-09-07 MED ORDER — ROSUVASTATIN CALCIUM 20 MG PO TABS
40.0000 mg | ORAL_TABLET | Freq: Every day | ORAL | Status: DC
  Administered 2022-09-07 – 2022-09-11 (×5): 40 mg via ORAL
  Filled 2022-09-07 (×5): qty 2

## 2022-09-07 MED ORDER — SPIRONOLACTONE 25 MG PO TABS
25.0000 mg | ORAL_TABLET | Freq: Every day | ORAL | Status: DC
  Administered 2022-09-07: 25 mg via ORAL
  Filled 2022-09-07: qty 1

## 2022-09-07 MED ORDER — LACTATED RINGERS IV SOLN: INTRAVENOUS | Status: DC

## 2022-09-07 MED ORDER — DEXTROSE 50 % IV SOLN: 0.0000 mL | INTRAVENOUS | Status: DC | PRN

## 2022-09-07 MED ORDER — SODIUM BICARBONATE 650 MG PO TABS
650.0000 mg | ORAL_TABLET | Freq: Two times a day (BID) | ORAL | Status: DC
  Administered 2022-09-07 – 2022-09-13 (×13): 650 mg via ORAL
  Filled 2022-09-07 (×14): qty 1

## 2022-09-07 MED ORDER — SODIUM ZIRCONIUM CYCLOSILICATE 10 G PO PACK
10.0000 g | PACK | Freq: Once | ORAL | Status: AC
  Administered 2022-09-07: 10 g via ORAL
  Filled 2022-09-07: qty 1

## 2022-09-07 MED ORDER — ORAL CARE MOUTH RINSE: 15.0000 mL | OROMUCOSAL | Status: DC | PRN

## 2022-09-07 MED ORDER — HYDROMORPHONE HCL 1 MG/ML IJ SOLN
0.5000 mg | INTRAMUSCULAR | Status: AC | PRN
  Administered 2022-09-08 (×2): 0.5 mg via INTRAVENOUS
  Filled 2022-09-07 (×3): qty 1

## 2022-09-07 MED ORDER — INSULIN ASPART PROT & ASPART (70-30 MIX) 100 UNIT/ML ~~LOC~~ SUSP
12.0000 [IU] | Freq: Once | SUBCUTANEOUS | Status: AC
  Administered 2022-09-07: 12 [IU] via SUBCUTANEOUS
  Filled 2022-09-07: qty 10

## 2022-09-07 MED ORDER — PANTOPRAZOLE SODIUM 40 MG PO TBEC
40.0000 mg | DELAYED_RELEASE_TABLET | Freq: Every day | ORAL | Status: DC
  Administered 2022-09-07 – 2022-09-13 (×7): 40 mg via ORAL
  Filled 2022-09-07 (×7): qty 1

## 2022-09-07 MED ORDER — SODIUM CHLORIDE 0.9 % IV BOLUS
1000.0000 mL | Freq: Once | INTRAVENOUS | Status: AC
  Administered 2022-09-07: 1000 mL via INTRAVENOUS

## 2022-09-07 MED ORDER — ACETAMINOPHEN 650 MG RE SUPP: 650.0000 mg | Freq: Four times a day (QID) | RECTAL | Status: DC | PRN

## 2022-09-07 MED ORDER — PNEUMOCOCCAL VAC POLYVALENT 25 MCG/0.5ML IJ INJ
0.5000 mL | INJECTION | INTRAMUSCULAR | Status: AC
  Administered 2022-09-09: 0.5 mL via INTRAMUSCULAR
  Filled 2022-09-07: qty 0.5

## 2022-09-07 MED ORDER — LEVOTHYROXINE SODIUM 25 MCG PO TABS
125.0000 ug | ORAL_TABLET | Freq: Every day | ORAL | Status: DC
  Administered 2022-09-08 – 2022-09-13 (×6): 125 ug via ORAL
  Filled 2022-09-07 (×6): qty 1

## 2022-09-07 MED ORDER — FENOFIBRATE 160 MG PO TABS
160.0000 mg | ORAL_TABLET | Freq: Every day | ORAL | Status: DC
  Administered 2022-09-07 – 2022-09-11 (×5): 160 mg via ORAL
  Filled 2022-09-07 (×5): qty 1

## 2022-09-07 MED ORDER — DULOXETINE HCL 30 MG PO CPEP
30.0000 mg | ORAL_CAPSULE | Freq: Every day | ORAL | Status: DC
  Administered 2022-09-07 – 2022-09-10 (×4): 30 mg via ORAL
  Filled 2022-09-07 (×4): qty 1

## 2022-09-07 MED ORDER — PROCHLORPERAZINE EDISYLATE 10 MG/2ML IJ SOLN
10.0000 mg | Freq: Four times a day (QID) | INTRAMUSCULAR | Status: DC | PRN
  Administered 2022-09-08: 10 mg via INTRAVENOUS
  Filled 2022-09-07 (×2): qty 2

## 2022-09-07 MED ORDER — HYDRALAZINE HCL 50 MG PO TABS
50.0000 mg | ORAL_TABLET | Freq: Three times a day (TID) | ORAL | Status: DC
  Administered 2022-09-07 – 2022-09-13 (×14): 50 mg via ORAL
  Filled 2022-09-07 (×3): qty 1
  Filled 2022-09-07: qty 2
  Filled 2022-09-07 (×13): qty 1

## 2022-09-07 MED ORDER — CALCIUM GLUCONATE 10 % IV SOLN
1.0000 g | Freq: Once | INTRAVENOUS | Status: AC
  Administered 2022-09-07: 1 g via INTRAVENOUS
  Filled 2022-09-07: qty 10

## 2022-09-07 MED ORDER — ASPIRIN 81 MG PO TBEC
81.0000 mg | DELAYED_RELEASE_TABLET | Freq: Every day | ORAL | Status: DC
  Administered 2022-09-07 – 2022-09-13 (×7): 81 mg via ORAL
  Filled 2022-09-07 (×7): qty 1

## 2022-09-07 MED ORDER — DIPHENHYDRAMINE HCL 50 MG/ML IJ SOLN
12.5000 mg | INTRAMUSCULAR | Status: AC | PRN
  Administered 2022-09-07 (×3): 12.5 mg via INTRAVENOUS
  Filled 2022-09-07 (×3): qty 1

## 2022-09-07 MED ORDER — INSULIN REGULAR(HUMAN) IN NACL 100-0.9 UT/100ML-% IV SOLN
INTRAVENOUS | Status: DC
  Administered 2022-09-07: 8.5 [IU]/h via INTRAVENOUS
  Filled 2022-09-07: qty 100

## 2022-09-07 MED ORDER — PANCRELIPASE (LIP-PROT-AMYL) 36000-114000 UNITS PO CPEP
36000.0000 [IU] | ORAL_CAPSULE | Freq: Three times a day (TID) | ORAL | Status: DC
  Administered 2022-09-07 – 2022-09-13 (×18): 36000 [IU] via ORAL
  Filled 2022-09-07 (×19): qty 1

## 2022-09-07 MED ORDER — HYDROMORPHONE HCL 1 MG/ML IJ SOLN
0.5000 mg | INTRAMUSCULAR | Status: AC | PRN
  Administered 2022-09-07 (×3): 0.5 mg via INTRAVENOUS
  Filled 2022-09-07 (×3): qty 1

## 2022-09-07 MED ORDER — LORATADINE 10 MG PO TABS
10.0000 mg | ORAL_TABLET | Freq: Every day | ORAL | Status: DC
  Administered 2022-09-07 – 2022-09-13 (×7): 10 mg via ORAL
  Filled 2022-09-07 (×7): qty 1
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
## 2022-09-07 NOTE — Telephone Encounter (Signed)
Pt reached out to me to cancel appointment for this week due to her having another appointment same time/day.   Kerry Hough, EMT-Paramedic  [phone] 08/29/2022

## 2022-09-07 NOTE — ED Notes (Signed)
Dr Ortiz at bedside 

## 2022-09-07 NOTE — Inpatient Diabetes Management (Signed)
Inpatient Diabetes Program Recommendations  AACE/ADA: New Consensus Statement on Inpatient Glycemic Control (2015)  Target Ranges:  Prepandial:   less than 140 mg/dL      Peak postprandial:   less than 180 mg/dL (1-2 hours)      Critically ill patients:  140 - 180 mg/dL   Lab Results  Component Value Date   GLUCAP 138 (H) 09/07/2022   HGBA1C 11.1 (H) 06/03/2022    Review of Glycemic Control  Diabetes history: DM 3c on insulin Outpatient Diabetes medications: Humulin R U-500 185 units tid Current orders for Inpatient glycemic control:  IV insulin/DKA/Endotool  A1c 11.1% on 3/8  Pt is very familiar to our team. Pt usually is not placed on her home U-500 insulin from home while inpatient due to hypoglycemia.  Inpatient Diabetes Program Recommendations:    -   Consider Semglee 60 units bid -   Novolog 0-15 units tid + hs -   Novolog 4 units tid meal coverage if eating >50% of meals  Thanks,  Christena Deem RN, MSN, BC-ADM Inpatient Diabetes Coordinator Team Pager [phone] (8a-5p)
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
## 2022-09-08 DIAGNOSIS — E1122 Type 2 diabetes mellitus with diabetic chronic kidney disease: Secondary | ICD-10-CM | POA: Diagnosis not present

## 2022-09-08 DIAGNOSIS — K760 Fatty (change of) liver, not elsewhere classified: Secondary | ICD-10-CM | POA: Diagnosis not present

## 2022-09-08 DIAGNOSIS — Z79899 Other long term (current) drug therapy: Secondary | ICD-10-CM | POA: Diagnosis not present

## 2022-09-08 DIAGNOSIS — E871 Hypo-osmolality and hyponatremia: Secondary | ICD-10-CM | POA: Diagnosis not present

## 2022-09-08 DIAGNOSIS — I10 Essential (primary) hypertension: Secondary | ICD-10-CM | POA: Diagnosis not present

## 2022-09-08 DIAGNOSIS — E1110 Type 2 diabetes mellitus with ketoacidosis without coma: Secondary | ICD-10-CM | POA: Diagnosis not present

## 2022-09-08 DIAGNOSIS — R519 Headache, unspecified: Secondary | ICD-10-CM | POA: Diagnosis not present

## 2022-09-08 DIAGNOSIS — E1010 Type 1 diabetes mellitus with ketoacidosis without coma: Secondary | ICD-10-CM

## 2022-09-08 DIAGNOSIS — E860 Dehydration: Secondary | ICD-10-CM | POA: Diagnosis present

## 2022-09-08 DIAGNOSIS — Z83438 Family history of other disorder of lipoprotein metabolism and other lipidemia: Secondary | ICD-10-CM | POA: Diagnosis not present

## 2022-09-08 DIAGNOSIS — E1169 Type 2 diabetes mellitus with other specified complication: Secondary | ICD-10-CM | POA: Diagnosis not present

## 2022-09-08 DIAGNOSIS — E875 Hyperkalemia: Secondary | ICD-10-CM | POA: Diagnosis present

## 2022-09-08 DIAGNOSIS — I5022 Chronic systolic (congestive) heart failure: Secondary | ICD-10-CM | POA: Diagnosis not present

## 2022-09-08 DIAGNOSIS — E039 Hypothyroidism, unspecified: Secondary | ICD-10-CM | POA: Diagnosis not present

## 2022-09-08 DIAGNOSIS — Z23 Encounter for immunization: Secondary | ICD-10-CM | POA: Diagnosis not present

## 2022-09-08 DIAGNOSIS — I272 Pulmonary hypertension, unspecified: Secondary | ICD-10-CM | POA: Diagnosis not present

## 2022-09-08 DIAGNOSIS — D649 Anemia, unspecified: Secondary | ICD-10-CM | POA: Diagnosis not present

## 2022-09-08 DIAGNOSIS — Z800 Family history of malignant neoplasm of digestive organs: Secondary | ICD-10-CM | POA: Diagnosis not present

## 2022-09-08 DIAGNOSIS — R220 Localized swelling, mass and lump, head: Secondary | ICD-10-CM | POA: Diagnosis not present

## 2022-09-08 DIAGNOSIS — Z7982 Long term (current) use of aspirin: Secondary | ICD-10-CM | POA: Diagnosis not present

## 2022-09-08 DIAGNOSIS — Z794 Long term (current) use of insulin: Secondary | ICD-10-CM | POA: Diagnosis not present

## 2022-09-08 DIAGNOSIS — I130 Hypertensive heart and chronic kidney disease with heart failure and stage 1 through stage 4 chronic kidney disease, or unspecified chronic kidney disease: Secondary | ICD-10-CM | POA: Diagnosis not present

## 2022-09-08 DIAGNOSIS — Z79890 Hormone replacement therapy (postmenopausal): Secondary | ICD-10-CM | POA: Diagnosis not present

## 2022-09-08 DIAGNOSIS — E1165 Type 2 diabetes mellitus with hyperglycemia: Secondary | ICD-10-CM | POA: Diagnosis not present

## 2022-09-08 DIAGNOSIS — Z8249 Family history of ischemic heart disease and other diseases of the circulatory system: Secondary | ICD-10-CM | POA: Diagnosis not present

## 2022-09-08 DIAGNOSIS — N1832 Chronic kidney disease, stage 3b: Secondary | ICD-10-CM | POA: Diagnosis not present

## 2022-09-08 DIAGNOSIS — Z8349 Family history of other endocrine, nutritional and metabolic diseases: Secondary | ICD-10-CM | POA: Diagnosis not present

## 2022-09-08 DIAGNOSIS — E781 Pure hyperglyceridemia: Secondary | ICD-10-CM | POA: Diagnosis present

## 2022-09-08 DIAGNOSIS — Z833 Family history of diabetes mellitus: Secondary | ICD-10-CM | POA: Diagnosis not present

## 2022-09-08 LAB — BASIC METABOLIC PANEL
Anion gap: 10 (ref 5–15)
Anion gap: 11 (ref 5–15)
Anion gap: 11 (ref 5–15)
Anion gap: 13 (ref 5–15)
Anion gap: 18 — ABNORMAL HIGH (ref 5–15)
Anion gap: 8 (ref 5–15)
Anion gap: 9 (ref 5–15)
BUN: 35 mg/dL — ABNORMAL HIGH (ref 6–20)
BUN: 38 mg/dL — ABNORMAL HIGH (ref 6–20)
BUN: 38 mg/dL — ABNORMAL HIGH (ref 6–20)
BUN: 39 mg/dL — ABNORMAL HIGH (ref 6–20)
BUN: 39 mg/dL — ABNORMAL HIGH (ref 6–20)
BUN: 44 mg/dL — ABNORMAL HIGH (ref 6–20)
BUN: 52 mg/dL — ABNORMAL HIGH (ref 6–20)
CO2: 19 mmol/L — ABNORMAL LOW (ref 22–32)
CO2: 20 mmol/L — ABNORMAL LOW (ref 22–32)
CO2: 21 mmol/L — ABNORMAL LOW (ref 22–32)
CO2: 23 mmol/L (ref 22–32)
CO2: 23 mmol/L (ref 22–32)
CO2: 23 mmol/L (ref 22–32)
CO2: 24 mmol/L (ref 22–32)
Calcium: 8.2 mg/dL — ABNORMAL LOW (ref 8.9–10.3)
Calcium: 8.2 mg/dL — ABNORMAL LOW (ref 8.9–10.3)
Calcium: 8.3 mg/dL — ABNORMAL LOW (ref 8.9–10.3)
Calcium: 8.4 mg/dL — ABNORMAL LOW (ref 8.9–10.3)
Calcium: 8.6 mg/dL — ABNORMAL LOW (ref 8.9–10.3)
Calcium: 9.2 mg/dL (ref 8.9–10.3)
Calcium: 9.2 mg/dL (ref 8.9–10.3)
Chloride: 102 mmol/L (ref 98–111)
Chloride: 96 mmol/L — ABNORMAL LOW (ref 98–111)
Chloride: 96 mmol/L — ABNORMAL LOW (ref 98–111)
Chloride: 96 mmol/L — ABNORMAL LOW (ref 98–111)
Chloride: 97 mmol/L — ABNORMAL LOW (ref 98–111)
Chloride: 98 mmol/L (ref 98–111)
Chloride: 99 mmol/L (ref 98–111)
Creatinine, Ser: 1.75 mg/dL — ABNORMAL HIGH (ref 0.44–1.00)
Creatinine, Ser: 1.81 mg/dL — ABNORMAL HIGH (ref 0.44–1.00)
Creatinine, Ser: 1.81 mg/dL — ABNORMAL HIGH (ref 0.44–1.00)
Creatinine, Ser: 1.83 mg/dL — ABNORMAL HIGH (ref 0.44–1.00)
Creatinine, Ser: 1.89 mg/dL — ABNORMAL HIGH (ref 0.44–1.00)
Creatinine, Ser: 1.9 mg/dL — ABNORMAL HIGH (ref 0.44–1.00)
Creatinine, Ser: 1.93 mg/dL — ABNORMAL HIGH (ref 0.44–1.00)
GFR, Estimated: 35 mL/min — ABNORMAL LOW (ref >60)
GFR, Estimated: 36 mL/min — ABNORMAL LOW (ref >60)
GFR, Estimated: 36 mL/min — ABNORMAL LOW (ref >60)
GFR, Estimated: 37 mL/min — ABNORMAL LOW (ref >60)
GFR, Estimated: 38 mL/min — ABNORMAL LOW (ref >60)
GFR, Estimated: 38 mL/min — ABNORMAL LOW (ref >60)
GFR, Estimated: 39 mL/min — ABNORMAL LOW (ref >60)
Glucose, Bld: 129 mg/dL — ABNORMAL HIGH (ref 70–99)
Glucose, Bld: 145 mg/dL — ABNORMAL HIGH (ref 70–99)
Glucose, Bld: 163 mg/dL — ABNORMAL HIGH (ref 70–99)
Glucose, Bld: 165 mg/dL — ABNORMAL HIGH (ref 70–99)
Glucose, Bld: 174 mg/dL — ABNORMAL HIGH (ref 70–99)
Glucose, Bld: 212 mg/dL — ABNORMAL HIGH (ref 70–99)
Glucose, Bld: 214 mg/dL — ABNORMAL HIGH (ref 70–99)
Potassium: 3.5 mmol/L (ref 3.5–5.1)
Potassium: 3.5 mmol/L (ref 3.5–5.1)
Potassium: 3.5 mmol/L (ref 3.5–5.1)
Potassium: 3.6 mmol/L (ref 3.5–5.1)
Potassium: 3.6 mmol/L (ref 3.5–5.1)
Potassium: 4 mmol/L (ref 3.5–5.1)
Potassium: 4.1 mmol/L (ref 3.5–5.1)
Sodium: 128 mmol/L — ABNORMAL LOW (ref 135–145)
Sodium: 129 mmol/L — ABNORMAL LOW (ref 135–145)
Sodium: 129 mmol/L — ABNORMAL LOW (ref 135–145)
Sodium: 130 mmol/L — ABNORMAL LOW (ref 135–145)
Sodium: 130 mmol/L — ABNORMAL LOW (ref 135–145)
Sodium: 135 mmol/L (ref 135–145)
Sodium: 136 mmol/L (ref 135–145)

## 2022-09-08 LAB — GLUCOSE, CAPILLARY
Glucose-Capillary: 113 mg/dL — ABNORMAL HIGH (ref 70–99)
Glucose-Capillary: 118 mg/dL — ABNORMAL HIGH (ref 70–99)
Glucose-Capillary: 124 mg/dL — ABNORMAL HIGH (ref 70–99)
Glucose-Capillary: 140 mg/dL — ABNORMAL HIGH (ref 70–99)
Glucose-Capillary: 141 mg/dL — ABNORMAL HIGH (ref 70–99)
Glucose-Capillary: 152 mg/dL — ABNORMAL HIGH (ref 70–99)
Glucose-Capillary: 155 mg/dL — ABNORMAL HIGH (ref 70–99)
Glucose-Capillary: 160 mg/dL — ABNORMAL HIGH (ref 70–99)
Glucose-Capillary: 162 mg/dL — ABNORMAL HIGH (ref 70–99)
Glucose-Capillary: 166 mg/dL — ABNORMAL HIGH (ref 70–99)
Glucose-Capillary: 172 mg/dL — ABNORMAL HIGH (ref 70–99)
Glucose-Capillary: 174 mg/dL — ABNORMAL HIGH (ref 70–99)
Glucose-Capillary: 178 mg/dL — ABNORMAL HIGH (ref 70–99)
Glucose-Capillary: 186 mg/dL — ABNORMAL HIGH (ref 70–99)
Glucose-Capillary: 207 mg/dL — ABNORMAL HIGH (ref 70–99)
Glucose-Capillary: 213 mg/dL — ABNORMAL HIGH (ref 70–99)
Glucose-Capillary: 218 mg/dL — ABNORMAL HIGH (ref 70–99)
Glucose-Capillary: 81 mg/dL (ref 70–99)

## 2022-09-08 LAB — CBC
HCT: 26.4 % — ABNORMAL LOW (ref 36.0–46.0)
Hemoglobin: 9 g/dL — ABNORMAL LOW (ref 12.0–15.0)
MCH: 30.4 pg (ref 26.0–34.0)
MCHC: 34.1 g/dL (ref 30.0–36.0)
MCV: 89.2 fL (ref 80.0–100.0)
Platelets: 159 10*3/uL (ref 150–400)
RBC: 2.96 MIL/uL — ABNORMAL LOW (ref 3.87–5.11)
RDW: 15.3 % (ref 11.5–15.5)
WBC: 5.8 10*3/uL (ref 4.0–10.5)
nRBC: 0 % (ref 0.0–0.2)

## 2022-09-08 LAB — COMPREHENSIVE METABOLIC PANEL
ALT: 62 U/L — ABNORMAL HIGH (ref 0–44)
AST: 87 U/L — ABNORMAL HIGH (ref 15–41)
Albumin: 3.5 g/dL (ref 3.5–5.0)
Alkaline Phosphatase: 93 U/L (ref 38–126)
Anion gap: 18 — ABNORMAL HIGH (ref 5–15)
BUN: 43 mg/dL — ABNORMAL HIGH (ref 6–20)
CO2: 19 mmol/L — ABNORMAL LOW (ref 22–32)
Calcium: 8.9 mg/dL (ref 8.9–10.3)
Chloride: 99 mmol/L (ref 98–111)
Creatinine, Ser: 1.96 mg/dL — ABNORMAL HIGH (ref 0.44–1.00)
GFR, Estimated: 34 mL/min — ABNORMAL LOW (ref >60)
Glucose, Bld: 173 mg/dL — ABNORMAL HIGH (ref 70–99)
Potassium: 3.8 mmol/L (ref 3.5–5.1)
Sodium: 136 mmol/L (ref 135–145)
Total Bilirubin: 0.4 mg/dL (ref 0.3–1.2)
Total Protein: 6.6 g/dL (ref 6.5–8.1)

## 2022-09-08 LAB — TRIGLYCERIDES: Triglycerides: >5000 mg/dL — ABNORMAL HIGH (ref <150)

## 2022-09-08 LAB — BETA-HYDROXYBUTYRIC ACID: Beta-Hydroxybutyric Acid: 0.07 mmol/L (ref 0.05–0.27)

## 2022-09-08 MED ORDER — HYDROMORPHONE HCL 1 MG/ML IJ SOLN
0.5000 mg | INTRAMUSCULAR | Status: DC | PRN
  Administered 2022-09-08: 0.5 mg via INTRAVENOUS

## 2022-09-08 MED ORDER — INSULIN GLARGINE-YFGN 100 UNIT/ML ~~LOC~~ SOLN: 60.0000 [IU] | Freq: Two times a day (BID) | SUBCUTANEOUS | Status: DC

## 2022-09-08 MED ORDER — INSULIN ASPART 100 UNIT/ML IJ SOLN: 0.0000 [IU] | Freq: Three times a day (TID) | INTRAMUSCULAR | Status: DC

## 2022-09-08 MED ORDER — INSULIN (MYXREDLIN) INFUSION FOR HYPERTRIGLYCERIDEMIA
0.2000 [IU]/kg/h | INTRAVENOUS | Status: DC
  Administered 2022-09-08 – 2022-09-12 (×13): 0.2 [IU]/kg/h via INTRAVENOUS
  Filled 2022-09-08 (×13): qty 100

## 2022-09-08 MED ORDER — DEXTROSE 5 % IV SOLN: INTRAVENOUS | Status: DC

## 2022-09-08 MED ORDER — INSULIN ASPART 100 UNIT/ML IJ SOLN
4.0000 [IU] | Freq: Three times a day (TID) | INTRAMUSCULAR | Status: DC
Start: 2022-09-08 — End: 2022-09-08

## 2022-09-08 MED ORDER — SPIRONOLACTONE 25 MG PO TABS
25.0000 mg | ORAL_TABLET | Freq: Every day | ORAL | Status: DC
  Administered 2022-09-08 – 2022-09-11 (×4): 25 mg via ORAL
  Filled 2022-09-08 (×4): qty 1

## 2022-09-08 MED ORDER — HYDROMORPHONE HCL 1 MG/ML IJ SOLN
0.5000 mg | INTRAMUSCULAR | Status: DC | PRN
  Administered 2022-09-08 – 2022-09-13 (×25): 0.5 mg via INTRAVENOUS
  Filled 2022-09-08 (×25): qty 1

## 2022-09-08 MED ORDER — SODIUM CHLORIDE 0.9 % IV SOLN: INTRAVENOUS | Status: DC | PRN

## 2022-09-08 MED ORDER — DIPHENHYDRAMINE HCL 50 MG/ML IJ SOLN
12.5000 mg | INTRAMUSCULAR | Status: AC | PRN
  Administered 2022-09-08: 12.5 mg via INTRAVENOUS

## 2022-09-08 MED ORDER — DIPHENHYDRAMINE HCL 50 MG/ML IJ SOLN
12.5000 mg | Freq: Four times a day (QID) | INTRAMUSCULAR | Status: DC | PRN
  Administered 2022-09-08 – 2022-09-13 (×14): 12.5 mg via INTRAVENOUS
  Filled 2022-09-08 (×14): qty 1
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
## 2022-09-08 NOTE — Plan of Care (Signed)
  Problem: Education: Goal: Ability to describe self-care measures that may prevent or decrease complications (Diabetes Survival Skills Education) will improve Outcome: Progressing   Problem: Coping: Goal: Ability to adjust to condition or change in health will improve Outcome: Progressing   Problem: Fluid Volume: Goal: Ability to maintain a balanced intake and output will improve Outcome: Progressing   Problem: Health Behavior/Discharge Planning: Goal: Ability to identify and utilize available resources and services will improve Outcome: Progressing Goal: Ability to manage health-related needs will improve Outcome: Progressing   Problem: Metabolic: Goal: Ability to maintain appropriate glucose levels will improve Outcome: Progressing   Problem: Nutritional: Goal: Maintenance of adequate nutrition will improve Outcome: Progressing Goal: Progress toward achieving an optimal weight will improve Outcome: Progressing   Problem: Skin Integrity: Goal: Risk for impaired skin integrity will decrease Outcome: Progressing   Problem: Tissue Perfusion: Goal: Adequacy of tissue perfusion will improve Outcome: Progressing   Problem: Education: Goal: Ability to describe self-care measures that may prevent or decrease complications (Diabetes Survival Skills Education) will improve Outcome: Progressing   Problem: Cardiac: Goal: Ability to maintain an adequate cardiac output will improve Outcome: Progressing   Problem: Health Behavior/Discharge Planning: Goal: Ability to identify and utilize available resources and services will improve Outcome: Progressing Goal: Ability to manage health-related needs will improve Outcome: Progressing   Problem: Fluid Volume: Goal: Ability to achieve a balanced intake and output will improve Outcome: Progressing   Problem: Metabolic: Goal: Ability to maintain appropriate glucose levels will improve Outcome: Progressing   Problem:  Nutritional: Goal: Maintenance of adequate nutrition will improve Outcome: Progressing Goal: Maintenance of adequate weight for body size and type will improve Outcome: Progressing   Problem: Respiratory: Goal: Will regain and/or maintain adequate ventilation Outcome: Progressing   Problem: Urinary Elimination: Goal: Ability to achieve and maintain adequate renal perfusion and functioning will improve Outcome: Progressing   Problem: Education: Goal: Knowledge of General Education information will improve Description: Including pain rating scale, medication(s)/side effects and non-pharmacologic comfort measures Outcome: Progressing   Problem: Health Behavior/Discharge Planning: Goal: Ability to manage health-related needs will improve Outcome: Progressing   Problem: Clinical Measurements: Goal: Ability to maintain clinical measurements within normal limits will improve Outcome: Progressing Goal: Will remain free from infection Outcome: Progressing Goal: Diagnostic test results will improve Outcome: Progressing Goal: Respiratory complications will improve Outcome: Progressing Goal: Cardiovascular complication will be avoided Outcome: Progressing   Problem: Activity: Goal: Risk for activity intolerance will decrease Outcome: Progressing   Problem: Nutrition: Goal: Adequate nutrition will be maintained Outcome: Progressing   Problem: Coping: Goal: Level of anxiety will decrease Outcome: Progressing   Problem: Elimination: Goal: Will not experience complications related to bowel motility Outcome: Progressing Goal: Will not experience complications related to urinary retention Outcome: Progressing   Problem: Pain Managment: Goal: General experience of comfort will improve Outcome: Progressing   Problem: Safety: Goal: Ability to remain free from injury will improve Outcome: Progressing   Problem: Skin Integrity: Goal: Risk for impaired skin integrity will  decrease Outcome: Progressing  Cindy S. Clelia Croft BSN, RN, CCRP, CCRN 09/08/2022 1:36 AM

## 2022-09-09 DIAGNOSIS — E1010 Type 1 diabetes mellitus with ketoacidosis without coma: Secondary | ICD-10-CM | POA: Diagnosis not present

## 2022-09-09 LAB — GLUCOSE, CAPILLARY
Glucose-Capillary: 100 mg/dL — ABNORMAL HIGH (ref 70–99)
Glucose-Capillary: 112 mg/dL — ABNORMAL HIGH (ref 70–99)
Glucose-Capillary: 117 mg/dL — ABNORMAL HIGH (ref 70–99)
Glucose-Capillary: 127 mg/dL — ABNORMAL HIGH (ref 70–99)
Glucose-Capillary: 139 mg/dL — ABNORMAL HIGH (ref 70–99)
Glucose-Capillary: 141 mg/dL — ABNORMAL HIGH (ref 70–99)
Glucose-Capillary: 142 mg/dL — ABNORMAL HIGH (ref 70–99)
Glucose-Capillary: 143 mg/dL — ABNORMAL HIGH (ref 70–99)
Glucose-Capillary: 146 mg/dL — ABNORMAL HIGH (ref 70–99)
Glucose-Capillary: 147 mg/dL — ABNORMAL HIGH (ref 70–99)
Glucose-Capillary: 152 mg/dL — ABNORMAL HIGH (ref 70–99)
Glucose-Capillary: 153 mg/dL — ABNORMAL HIGH (ref 70–99)
Glucose-Capillary: 154 mg/dL — ABNORMAL HIGH (ref 70–99)
Glucose-Capillary: 160 mg/dL — ABNORMAL HIGH (ref 70–99)
Glucose-Capillary: 165 mg/dL — ABNORMAL HIGH (ref 70–99)
Glucose-Capillary: 179 mg/dL — ABNORMAL HIGH (ref 70–99)
Glucose-Capillary: 188 mg/dL — ABNORMAL HIGH (ref 70–99)
Glucose-Capillary: 193 mg/dL — ABNORMAL HIGH (ref 70–99)
Glucose-Capillary: 197 mg/dL — ABNORMAL HIGH (ref 70–99)
Glucose-Capillary: 208 mg/dL — ABNORMAL HIGH (ref 70–99)
Glucose-Capillary: 223 mg/dL — ABNORMAL HIGH (ref 70–99)
Glucose-Capillary: 87 mg/dL (ref 70–99)
Glucose-Capillary: 97 mg/dL (ref 70–99)

## 2022-09-09 LAB — BASIC METABOLIC PANEL
Anion gap: 14 (ref 5–15)
Anion gap: 13 (ref 5–15)
Anion gap: 18 — ABNORMAL HIGH (ref 5–15)
BUN: 38 mg/dL — ABNORMAL HIGH (ref 6–20)
BUN: 39 mg/dL — ABNORMAL HIGH (ref 6–20)
BUN: 39 mg/dL — ABNORMAL HIGH (ref 6–20)
CO2: 21 mmol/L — ABNORMAL LOW (ref 22–32)
CO2: 21 mmol/L — ABNORMAL LOW (ref 22–32)
CO2: 17 mmol/L — ABNORMAL LOW (ref 22–32)
Calcium: 8.3 mg/dL — ABNORMAL LOW (ref 8.9–10.3)
Calcium: 8.1 mg/dL — ABNORMAL LOW (ref 8.9–10.3)
Calcium: 8.5 mg/dL — ABNORMAL LOW (ref 8.9–10.3)
Chloride: 98 mmol/L (ref 98–111)
Chloride: 97 mmol/L — ABNORMAL LOW (ref 98–111)
Chloride: 98 mmol/L (ref 98–111)
Creatinine, Ser: 2.07 mg/dL — ABNORMAL HIGH (ref 0.44–1.00)
Creatinine, Ser: 2.14 mg/dL — ABNORMAL HIGH (ref 0.44–1.00)
Creatinine, Ser: 2.37 mg/dL — ABNORMAL HIGH (ref 0.44–1.00)
GFR, Estimated: 32 mL/min — ABNORMAL LOW (ref >60)
GFR, Estimated: 31 mL/min — ABNORMAL LOW (ref >60)
GFR, Estimated: 27 mL/min — ABNORMAL LOW (ref >60)
Glucose, Bld: 174 mg/dL — ABNORMAL HIGH (ref 70–99)
Glucose, Bld: 184 mg/dL — ABNORMAL HIGH (ref 70–99)
Glucose, Bld: 100 mg/dL — ABNORMAL HIGH (ref 70–99)
Potassium: 3.7 mmol/L (ref 3.5–5.1)
Potassium: 3.6 mmol/L (ref 3.5–5.1)
Potassium: 4.1 mmol/L (ref 3.5–5.1)
Sodium: 133 mmol/L — ABNORMAL LOW (ref 135–145)
Sodium: 131 mmol/L — ABNORMAL LOW (ref 135–145)
Sodium: 133 mmol/L — ABNORMAL LOW (ref 135–145)

## 2022-09-09 LAB — TRIGLYCERIDES: Triglycerides: 4389 mg/dL — ABNORMAL HIGH (ref <150)

## 2022-09-09 MED ORDER — POTASSIUM CHLORIDE CRYS ER 20 MEQ PO TBCR
40.0000 meq | EXTENDED_RELEASE_TABLET | Freq: Once | ORAL | Status: AC
  Administered 2022-09-09: 40 meq via ORAL
  Filled 2022-09-09: qty 2

## 2022-09-09 MED ORDER — IVABRADINE 2.5 MG HALF TABLET
2.5000 mg | ORAL_TABLET | Freq: Two times a day (BID) | ORAL | Status: DC
  Administered 2022-09-10 – 2022-09-13 (×6): 2.5 mg via ORAL
  Filled 2022-09-09 (×6): qty 1

## 2022-09-09 MED ORDER — BUTALBITAL-APAP-CAFFEINE 50-325-40 MG PO TABS
1.0000 | ORAL_TABLET | Freq: Four times a day (QID) | ORAL | Status: AC | PRN
  Administered 2022-09-09: 1 via ORAL
  Filled 2022-09-09: qty 1

## 2022-09-09 MED ORDER — IVABRADINE HCL 5 MG PO TABS
5.0000 mg | ORAL_TABLET | Freq: Two times a day (BID) | ORAL | Status: DC
  Administered 2022-09-10 – 2022-09-13 (×6): 5 mg via ORAL
  Filled 2022-09-09 (×6): qty 1
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
## 2022-09-09 NOTE — Progress Notes (Signed)
Chaplain met with Jennifer [name] to assess for needs and provide support.  She wanted to find some help for her dogs who were at home without anyone to care for them.  Chaplain found resources for her and brought them to her room.  She had already found a solution for the dogs, but was appreciative of the support.  She did not state any other needs at this time.

## 2022-09-10 DIAGNOSIS — E1010 Type 1 diabetes mellitus with ketoacidosis without coma: Secondary | ICD-10-CM | POA: Diagnosis not present

## 2022-09-10 LAB — GLUCOSE, CAPILLARY
Glucose-Capillary: 103 mg/dL — ABNORMAL HIGH (ref 70–99)
Glucose-Capillary: 106 mg/dL — ABNORMAL HIGH (ref 70–99)
Glucose-Capillary: 125 mg/dL — ABNORMAL HIGH (ref 70–99)
Glucose-Capillary: 128 mg/dL — ABNORMAL HIGH (ref 70–99)
Glucose-Capillary: 143 mg/dL — ABNORMAL HIGH (ref 70–99)
Glucose-Capillary: 158 mg/dL — ABNORMAL HIGH (ref 70–99)
Glucose-Capillary: 161 mg/dL — ABNORMAL HIGH (ref 70–99)
Glucose-Capillary: 198 mg/dL — ABNORMAL HIGH (ref 70–99)
Glucose-Capillary: 265 mg/dL — ABNORMAL HIGH (ref 70–99)
Glucose-Capillary: 98 mg/dL (ref 70–99)

## 2022-09-10 LAB — CBC
HCT: 27.7 % — ABNORMAL LOW (ref 36.0–46.0)
Hemoglobin: 9.4 g/dL — ABNORMAL LOW (ref 12.0–15.0)
MCH: 30.3 pg (ref 26.0–34.0)
MCHC: 33.9 g/dL (ref 30.0–36.0)
MCV: 89.4 fL (ref 80.0–100.0)
Platelets: 128 10*3/uL — ABNORMAL LOW (ref 150–400)
RBC: 3.1 MIL/uL — ABNORMAL LOW (ref 3.87–5.11)
RDW: 15.1 % (ref 11.5–15.5)
WBC: 5.8 10*3/uL (ref 4.0–10.5)
nRBC: 0 % (ref 0.0–0.2)

## 2022-09-10 LAB — BASIC METABOLIC PANEL
Anion gap: 11 (ref 5–15)
Anion gap: 11 (ref 5–15)
BUN: 42 mg/dL — ABNORMAL HIGH (ref 6–20)
BUN: 35 mg/dL — ABNORMAL HIGH (ref 6–20)
CO2: 23 mmol/L (ref 22–32)
CO2: 22 mmol/L (ref 22–32)
Calcium: 8.1 mg/dL — ABNORMAL LOW (ref 8.9–10.3)
Calcium: 8.1 mg/dL — ABNORMAL LOW (ref 8.9–10.3)
Chloride: 94 mmol/L — ABNORMAL LOW (ref 98–111)
Chloride: 95 mmol/L — ABNORMAL LOW (ref 98–111)
Creatinine, Ser: 2.18 mg/dL — ABNORMAL HIGH (ref 0.44–1.00)
Creatinine, Ser: 2.19 mg/dL — ABNORMAL HIGH (ref 0.44–1.00)
GFR, Estimated: 30 mL/min — ABNORMAL LOW (ref >60)
GFR, Estimated: 30 mL/min — ABNORMAL LOW (ref >60)
Glucose, Bld: 129 mg/dL — ABNORMAL HIGH (ref 70–99)
Glucose, Bld: 227 mg/dL — ABNORMAL HIGH (ref 70–99)
Potassium: 3.7 mmol/L (ref 3.5–5.1)
Potassium: 4.2 mmol/L (ref 3.5–5.1)
Sodium: 128 mmol/L — ABNORMAL LOW (ref 135–145)
Sodium: 128 mmol/L — ABNORMAL LOW (ref 135–145)

## 2022-09-10 LAB — TRIGLYCERIDES: Triglycerides: 3400 mg/dL — ABNORMAL HIGH (ref <150)

## 2022-09-10 MED ORDER — BUTALBITAL-APAP-CAFFEINE 50-325-40 MG PO TABS
1.0000 | ORAL_TABLET | Freq: Four times a day (QID) | ORAL | Status: AC | PRN
  Administered 2022-09-10 (×2): 1 via ORAL
  Filled 2022-09-10 (×2): qty 1

## 2022-09-10 MED ORDER — CHLORHEXIDINE GLUCONATE CLOTH 2 % EX PADS
6.0000 | MEDICATED_PAD | Freq: Every day | CUTANEOUS | Status: DC
  Administered 2022-09-10 – 2022-09-12 (×3): 6 via TOPICAL

## 2022-09-10 MED ORDER — POTASSIUM CHLORIDE CRYS ER 20 MEQ PO TBCR
30.0000 meq | EXTENDED_RELEASE_TABLET | Freq: Once | ORAL | Status: AC
  Administered 2022-09-10: 30 meq via ORAL
  Filled 2022-09-10: qty 1

## 2022-09-10 NOTE — Plan of Care (Signed)
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
## 2022-09-11 DIAGNOSIS — E1010 Type 1 diabetes mellitus with ketoacidosis without coma: Secondary | ICD-10-CM | POA: Diagnosis not present

## 2022-09-11 LAB — BASIC METABOLIC PANEL
Anion gap: 10 (ref 5–15)
BUN: 41 mg/dL — ABNORMAL HIGH (ref 6–20)
CO2: 25 mmol/L (ref 22–32)
Calcium: 8.3 mg/dL — ABNORMAL LOW (ref 8.9–10.3)
Chloride: 95 mmol/L — ABNORMAL LOW (ref 98–111)
Creatinine, Ser: 2.27 mg/dL — ABNORMAL HIGH (ref 0.44–1.00)
GFR, Estimated: 29 mL/min — ABNORMAL LOW (ref >60)
Glucose, Bld: 138 mg/dL — ABNORMAL HIGH (ref 70–99)
Potassium: 3.9 mmol/L (ref 3.5–5.1)
Sodium: 130 mmol/L — ABNORMAL LOW (ref 135–145)

## 2022-09-11 LAB — GLUCOSE, CAPILLARY
Glucose-Capillary: 103 mg/dL — ABNORMAL HIGH (ref 70–99)
Glucose-Capillary: 119 mg/dL — ABNORMAL HIGH (ref 70–99)
Glucose-Capillary: 121 mg/dL — ABNORMAL HIGH (ref 70–99)
Glucose-Capillary: 128 mg/dL — ABNORMAL HIGH (ref 70–99)
Glucose-Capillary: 141 mg/dL — ABNORMAL HIGH (ref 70–99)
Glucose-Capillary: 147 mg/dL — ABNORMAL HIGH (ref 70–99)
Glucose-Capillary: 159 mg/dL — ABNORMAL HIGH (ref 70–99)
Glucose-Capillary: 162 mg/dL — ABNORMAL HIGH (ref 70–99)
Glucose-Capillary: 172 mg/dL — ABNORMAL HIGH (ref 70–99)
Glucose-Capillary: 184 mg/dL — ABNORMAL HIGH (ref 70–99)
Glucose-Capillary: 192 mg/dL — ABNORMAL HIGH (ref 70–99)
Glucose-Capillary: 193 mg/dL — ABNORMAL HIGH (ref 70–99)
Glucose-Capillary: 203 mg/dL — ABNORMAL HIGH (ref 70–99)
Glucose-Capillary: 81 mg/dL (ref 70–99)

## 2022-09-11 LAB — CBC
HCT: 27.9 % — ABNORMAL LOW (ref 36.0–46.0)
Hemoglobin: 8.9 g/dL — ABNORMAL LOW (ref 12.0–15.0)
MCH: 29.6 pg (ref 26.0–34.0)
MCHC: 31.9 g/dL (ref 30.0–36.0)
MCV: 92.7 fL (ref 80.0–100.0)
Platelets: 139 10*3/uL — ABNORMAL LOW (ref 150–400)
RBC: 3.01 MIL/uL — ABNORMAL LOW (ref 3.87–5.11)
RDW: 15.2 % (ref 11.5–15.5)
WBC: 5.5 10*3/uL (ref 4.0–10.5)
nRBC: 0 % (ref 0.0–0.2)

## 2022-09-11 LAB — TRIGLYCERIDES: Triglycerides: 2077 mg/dL — ABNORMAL HIGH (ref <150)

## 2022-09-11 MED ORDER — ROSUVASTATIN CALCIUM 10 MG PO TABS
10.0000 mg | ORAL_TABLET | Freq: Every day | ORAL | Status: DC
  Administered 2022-09-12 – 2022-09-13 (×2): 10 mg via ORAL
  Filled 2022-09-11 (×2): qty 1

## 2022-09-11 MED ORDER — PREGABALIN 75 MG PO CAPS
150.0000 mg | ORAL_CAPSULE | Freq: Every day | ORAL | Status: DC
  Administered 2022-09-11 – 2022-09-12 (×2): 150 mg via ORAL
  Filled 2022-09-11 (×2): qty 2
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
## 2022-09-12 DIAGNOSIS — E1010 Type 1 diabetes mellitus with ketoacidosis without coma: Secondary | ICD-10-CM | POA: Diagnosis not present

## 2022-09-12 LAB — BASIC METABOLIC PANEL
Anion gap: 11 (ref 5–15)
BUN: 43 mg/dL — ABNORMAL HIGH (ref 6–20)
CO2: 25 mmol/L (ref 22–32)
Calcium: 8.4 mg/dL — ABNORMAL LOW (ref 8.9–10.3)
Chloride: 95 mmol/L — ABNORMAL LOW (ref 98–111)
Creatinine, Ser: 2.32 mg/dL — ABNORMAL HIGH (ref 0.44–1.00)
GFR, Estimated: 28 mL/min — ABNORMAL LOW (ref >60)
Glucose, Bld: 120 mg/dL — ABNORMAL HIGH (ref 70–99)
Potassium: 3.6 mmol/L (ref 3.5–5.1)
Sodium: 131 mmol/L — ABNORMAL LOW (ref 135–145)

## 2022-09-12 LAB — GLUCOSE, CAPILLARY
Glucose-Capillary: 133 mg/dL — ABNORMAL HIGH (ref 70–99)
Glucose-Capillary: 120 mg/dL — ABNORMAL HIGH (ref 70–99)
Glucose-Capillary: 113 mg/dL — ABNORMAL HIGH (ref 70–99)
Glucose-Capillary: 80 mg/dL (ref 70–99)
Glucose-Capillary: 137 mg/dL — ABNORMAL HIGH (ref 70–99)
Glucose-Capillary: 161 mg/dL — ABNORMAL HIGH (ref 70–99)
Glucose-Capillary: 187 mg/dL — ABNORMAL HIGH (ref 70–99)
Glucose-Capillary: 202 mg/dL — ABNORMAL HIGH (ref 70–99)
Glucose-Capillary: 216 mg/dL — ABNORMAL HIGH (ref 70–99)
Glucose-Capillary: 180 mg/dL — ABNORMAL HIGH (ref 70–99)

## 2022-09-12 LAB — TRIGLYCERIDES: Triglycerides: 1882 mg/dL — ABNORMAL HIGH (ref <150)

## 2022-09-12 MED ORDER — TORSEMIDE 20 MG PO TABS
40.0000 mg | ORAL_TABLET | Freq: Every day | ORAL | Status: DC
  Administered 2022-09-13: 40 mg via ORAL
  Filled 2022-09-12: qty 2

## 2022-09-12 MED ORDER — POTASSIUM CHLORIDE CRYS ER 20 MEQ PO TBCR
40.0000 meq | EXTENDED_RELEASE_TABLET | Freq: Once | ORAL | Status: AC
  Administered 2022-09-12: 40 meq via ORAL
  Filled 2022-09-12: qty 2

## 2022-09-12 MED ORDER — SPIRONOLACTONE 25 MG PO TABS
25.0000 mg | ORAL_TABLET | Freq: Every day | ORAL | Status: DC
  Administered 2022-09-13: 25 mg via ORAL
  Filled 2022-09-12: qty 1
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
## 2022-09-13 DIAGNOSIS — E1010 Type 1 diabetes mellitus with ketoacidosis without coma: Secondary | ICD-10-CM | POA: Diagnosis not present

## 2022-09-13 LAB — GLUCOSE, CAPILLARY
Glucose-Capillary: 146 mg/dL — ABNORMAL HIGH (ref 70–99)
Glucose-Capillary: 175 mg/dL — ABNORMAL HIGH (ref 70–99)
Glucose-Capillary: 201 mg/dL — ABNORMAL HIGH (ref 70–99)
Glucose-Capillary: 85 mg/dL (ref 70–99)

## 2022-09-13 LAB — BASIC METABOLIC PANEL
Anion gap: 8 (ref 5–15)
BUN: 40 mg/dL — ABNORMAL HIGH (ref 6–20)
CO2: 26 mmol/L (ref 22–32)
Calcium: 8.7 mg/dL — ABNORMAL LOW (ref 8.9–10.3)
Chloride: 100 mmol/L (ref 98–111)
Creatinine, Ser: 1.93 mg/dL — ABNORMAL HIGH (ref 0.44–1.00)
GFR, Estimated: 35 mL/min — ABNORMAL LOW (ref >60)
Glucose, Bld: 188 mg/dL — ABNORMAL HIGH (ref 70–99)
Potassium: 4.2 mmol/L (ref 3.5–5.1)
Sodium: 134 mmol/L — ABNORMAL LOW (ref 135–145)

## 2022-09-13 LAB — TRIGLYCERIDES: Triglycerides: 1489 mg/dL — ABNORMAL HIGH (ref <150)

## 2022-09-13 MED ORDER — INSULIN GLARGINE-YFGN 100 UNIT/ML ~~LOC~~ SOLN
60.0000 [IU] | Freq: Two times a day (BID) | SUBCUTANEOUS | Status: DC
  Administered 2022-09-13: 60 [IU] via SUBCUTANEOUS
  Filled 2022-09-13 (×3): qty 0.6

## 2022-09-13 MED ORDER — INSULIN ASPART 100 UNIT/ML IJ SOLN: 0.0000 [IU] | Freq: Three times a day (TID) | INTRAMUSCULAR | Status: DC
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
## 2022-09-13 NOTE — TOC Progression Note (Signed)
Transition of Care [facility]) - Progression Note    Patient Details  Name: Jennifer [name] MRN: [id number] Date of Birth: June 08, 1990  Transition of Care [name]) CM/SW Contact  Armanda Heritage, RN Phone Number: 09/13/2022, 9:33 AM  Clinical Narrative:    CM spoke with patient regarding consult for transportation assistance.  Patient intends to return home at discharge, but requests transportation assistance to [employer] at discharge to obtain a phone, after which she intends to arrange an UBER to home.  CM questioned if patient has funds to arrange ride to [employer], patient reports has debit card but no cash, CM discussed with patient that an ATM is available on main floor of hospital to obtain cash for cab ride.  No further TOC needs identified at this time.   Expected Discharge Plan: Home/Self Care Barriers to Discharge: No Barriers Identified  Expected Discharge Plan and Services   Discharge Planning Services: NA     Expected Discharge Date: 09/13/22                         HH Arranged: NA           Social Determinants of Health (SDOH) Interventions SDOH Screenings   Food Insecurity: No Food Insecurity (09/07/2022)  Housing: Low Risk  (09/07/2022)  Transportation Needs: Unmet Transportation Needs (09/08/2022)  Utilities: Not At Risk (09/07/2022)  Alcohol Screen: Low Risk  (12/10/2021)  Depression (PHQ2-9): Low Risk  (12/10/2021)  Financial Resource Strain: High Risk (04/05/2022)  Physical Activity: Sufficiently Active (12/10/2021)  Social Connections: Socially Integrated (12/10/2021)  Stress: No Stress Concern Present (12/10/2021)  Tobacco Use: Low Risk  (09/07/2022)    Readmission Risk Interventions    09/12/2022    5:44 PM 06/01/2022   11:13 AM 03/24/2022    5:05 PM  Readmission Risk Prevention Plan  Transportation Screening Complete Complete Complete  Medication Review (RN Care Manager) Complete Referral to Pharmacy Complete  PCP or Specialist appointment within 3-5  days of discharge Complete  Patient refused  HRI or Home Care Consult Complete Complete   SW Recovery Care/Counseling Consult Complete Complete   Palliative Care Screening Not Applicable Not Applicable   Skilled Nursing Facility Not Applicable Not Applicable Not Applicable

## 2022-09-14 ENCOUNTER — Other Ambulatory Visit: Payer: Self-pay

## 2022-09-14 ENCOUNTER — Emergency Department (HOSPITAL_COMMUNITY)
Admission: EM | Admit: 2022-09-14 | Discharge: 2022-09-15 | Disposition: A | Discharge status: Home / Self Care | Payer: Medicaid Other | Attending: Student | Admitting: Student

## 2022-09-14 DIAGNOSIS — R1084 Generalized abdominal pain: Secondary | ICD-10-CM | POA: Diagnosis not present

## 2022-09-14 DIAGNOSIS — Z7982 Long term (current) use of aspirin: Secondary | ICD-10-CM | POA: Diagnosis not present

## 2022-09-14 DIAGNOSIS — Z79899 Other long term (current) drug therapy: Secondary | ICD-10-CM | POA: Diagnosis not present

## 2022-09-14 DIAGNOSIS — I700 Atherosclerosis of aorta: Secondary | ICD-10-CM | POA: Diagnosis not present

## 2022-09-14 DIAGNOSIS — R1110 Vomiting, unspecified: Secondary | ICD-10-CM | POA: Diagnosis not present

## 2022-09-14 DIAGNOSIS — R1013 Epigastric pain: Secondary | ICD-10-CM | POA: Diagnosis not present

## 2022-09-14 DIAGNOSIS — I130 Hypertensive heart and chronic kidney disease with heart failure and stage 1 through stage 4 chronic kidney disease, or unspecified chronic kidney disease: Secondary | ICD-10-CM | POA: Diagnosis not present

## 2022-09-14 DIAGNOSIS — R000 Tachycardia, unspecified: Secondary | ICD-10-CM | POA: Diagnosis not present

## 2022-09-14 DIAGNOSIS — R112 Nausea with vomiting, unspecified: Secondary | ICD-10-CM | POA: Insufficient documentation

## 2022-09-14 DIAGNOSIS — R739 Hyperglycemia, unspecified: Secondary | ICD-10-CM | POA: Diagnosis not present

## 2022-09-14 DIAGNOSIS — R197 Diarrhea, unspecified: Secondary | ICD-10-CM | POA: Diagnosis not present

## 2022-09-14 DIAGNOSIS — R109 Unspecified abdominal pain: Secondary | ICD-10-CM | POA: Diagnosis not present

## 2022-09-14 DIAGNOSIS — E1022 Type 1 diabetes mellitus with diabetic chronic kidney disease: Secondary | ICD-10-CM | POA: Insufficient documentation

## 2022-09-14 DIAGNOSIS — N184 Chronic kidney disease, stage 4 (severe): Secondary | ICD-10-CM | POA: Diagnosis not present

## 2022-09-14 DIAGNOSIS — I10 Essential (primary) hypertension: Secondary | ICD-10-CM | POA: Diagnosis not present

## 2022-09-14 DIAGNOSIS — I5023 Acute on chronic systolic (congestive) heart failure: Secondary | ICD-10-CM | POA: Insufficient documentation

## 2022-09-14 DIAGNOSIS — Z794 Long term (current) use of insulin: Secondary | ICD-10-CM | POA: Diagnosis not present

## 2022-09-14 DIAGNOSIS — E039 Hypothyroidism, unspecified: Secondary | ICD-10-CM | POA: Insufficient documentation

## 2022-09-14 DIAGNOSIS — Z8616 Personal history of COVID-19: Secondary | ICD-10-CM | POA: Insufficient documentation

## 2022-09-14 LAB — CBG MONITORING, ED: Glucose-Capillary: 474 mg/dL — ABNORMAL HIGH (ref 70–99)

## 2022-09-14 LAB — CBC WITH DIFFERENTIAL/PLATELET
Abs Immature Granulocytes: 0.03 10*3/uL (ref 0.00–0.07)
Basophils Absolute: 0.1 10*3/uL (ref 0.0–0.1)
Basophils Relative: 1 %
Eosinophils Absolute: 0.1 10*3/uL (ref 0.0–0.5)
Eosinophils Relative: 1 %
HCT: 35.5 % — ABNORMAL LOW (ref 36.0–46.0)
Hemoglobin: 11.3 g/dL — ABNORMAL LOW (ref 12.0–15.0)
Immature Granulocytes: 1 %
Lymphocytes Relative: 44 %
Lymphs Abs: 2.8 10*3/uL (ref 0.7–4.0)
MCH: 28.4 pg (ref 26.0–34.0)
MCHC: 31.8 g/dL (ref 30.0–36.0)
MCV: 89.2 fL (ref 80.0–100.0)
Monocytes Absolute: 0.6 10*3/uL (ref 0.1–1.0)
Monocytes Relative: 9 %
Neutro Abs: 2.9 10*3/uL (ref 1.7–7.7)
Neutrophils Relative %: 44 %
Platelets: 196 10*3/uL (ref 150–400)
RBC: 3.98 MIL/uL (ref 3.87–5.11)
RDW: 14.6 % (ref 11.5–15.5)
WBC: 6.5 10*3/uL (ref 4.0–10.5)
nRBC: 0 % (ref 0.0–0.2)

## 2022-09-14 NOTE — ED Triage Notes (Signed)
Pt BIB [facility] from home. Pt c/o abdominal pain. Hx pancreatitis. Pt reports pain feeling like pancreatitis as it comes in waves. Pt aox4. Pt c/o N+V and diarrhea.  204/116 120HR 99% O2 18RR 460Cbg 96.9T

## 2022-09-15 ENCOUNTER — Encounter (HOSPITAL_COMMUNITY): Payer: Self-pay

## 2022-09-15 ENCOUNTER — Emergency Department (HOSPITAL_COMMUNITY): Payer: Medicaid Other

## 2022-09-15 DIAGNOSIS — I700 Atherosclerosis of aorta: Secondary | ICD-10-CM | POA: Diagnosis not present

## 2022-09-15 DIAGNOSIS — R109 Unspecified abdominal pain: Secondary | ICD-10-CM | POA: Diagnosis not present

## 2022-09-15 DIAGNOSIS — R1110 Vomiting, unspecified: Secondary | ICD-10-CM | POA: Diagnosis not present

## 2022-09-15 LAB — URINALYSIS, ROUTINE W REFLEX MICROSCOPIC
Bacteria, UA: NONE SEEN
Bilirubin Urine: NEGATIVE
Glucose, UA: >=500 mg/dL — AB
Ketones, ur: NEGATIVE mg/dL
Leukocytes,Ua: NEGATIVE
Nitrite: NEGATIVE
Protein, ur: 100 mg/dL — AB
Specific Gravity, Urine: 1.014 (ref 1.005–1.030)
pH: 7 (ref 5.0–8.0)

## 2022-09-15 LAB — COMPREHENSIVE METABOLIC PANEL
ALT: 65 U/L — ABNORMAL HIGH (ref 0–44)
AST: 56 U/L — ABNORMAL HIGH (ref 15–41)
Albumin: 4.4 g/dL (ref 3.5–5.0)
Alkaline Phosphatase: 104 U/L (ref 38–126)
Anion gap: 13 (ref 5–15)
BUN: 46 mg/dL — ABNORMAL HIGH (ref 6–20)
CO2: 25 mmol/L (ref 22–32)
Calcium: 10.4 mg/dL — ABNORMAL HIGH (ref 8.9–10.3)
Chloride: 90 mmol/L — ABNORMAL LOW (ref 98–111)
Creatinine, Ser: 2.04 mg/dL — ABNORMAL HIGH (ref 0.44–1.00)
GFR, Estimated: 33 mL/min — ABNORMAL LOW (ref >60)
Glucose, Bld: 494 mg/dL — ABNORMAL HIGH (ref 70–99)
Potassium: 4.8 mmol/L (ref 3.5–5.1)
Sodium: 128 mmol/L — ABNORMAL LOW (ref 135–145)
Total Bilirubin: 0.8 mg/dL (ref 0.3–1.2)
Total Protein: 8.6 g/dL — ABNORMAL HIGH (ref 6.5–8.1)

## 2022-09-15 LAB — BLOOD GAS, VENOUS
Acid-Base Excess: 5 mmol/L — ABNORMAL HIGH (ref 0.0–2.0)
Bicarbonate: 29.9 mmol/L — ABNORMAL HIGH (ref 20.0–28.0)
O2 Saturation: 81.8 %
Patient temperature: 37
pCO2, Ven: 44 mmHg (ref 44–60)
pH, Ven: 7.44 — ABNORMAL HIGH (ref 7.25–7.43)
pO2, Ven: 47 mmHg — ABNORMAL HIGH (ref 32–45)

## 2022-09-15 LAB — I-STAT CHEM 8, ED
BUN: 44 mg/dL — ABNORMAL HIGH (ref 6–20)
Calcium, Ion: 1.2 mmol/L (ref 1.15–1.40)
Chloride: 96 mmol/L — ABNORMAL LOW (ref 98–111)
Creatinine, Ser: 2.1 mg/dL — ABNORMAL HIGH (ref 0.44–1.00)
Glucose, Bld: 480 mg/dL — ABNORMAL HIGH (ref 70–99)
HCT: 33 % — ABNORMAL LOW (ref 36.0–46.0)
Hemoglobin: 11.2 g/dL — ABNORMAL LOW (ref 12.0–15.0)
Potassium: 4.8 mmol/L (ref 3.5–5.1)
Sodium: 130 mmol/L — ABNORMAL LOW (ref 135–145)
TCO2: 27 mmol/L (ref 22–32)

## 2022-09-15 LAB — I-STAT BETA HCG BLOOD, ED (MC, WL, AP ONLY): I-stat hCG, quantitative: <5 m[IU]/mL (ref <5)

## 2022-09-15 LAB — CBG MONITORING, ED: Glucose-Capillary: 319 mg/dL — ABNORMAL HIGH (ref 70–99)

## 2022-09-15 LAB — LIPASE, BLOOD: Lipase: 28 U/L (ref 11–51)

## 2022-09-15 LAB — BETA-HYDROXYBUTYRIC ACID: Beta-Hydroxybutyric Acid: 0.14 mmol/L (ref 0.05–0.27)

## 2022-09-15 MED ORDER — KETOROLAC TROMETHAMINE 15 MG/ML IJ SOLN
15.0000 mg | Freq: Once | INTRAMUSCULAR | Status: AC
  Administered 2022-09-15: 15 mg via INTRAMUSCULAR
  Filled 2022-09-15: qty 1

## 2022-09-15 MED ORDER — LORAZEPAM 2 MG/ML IJ SOLN
0.5000 mg | Freq: Once | INTRAMUSCULAR | Status: AC
  Administered 2022-09-15: 0.5 mg via INTRAVENOUS
  Filled 2022-09-15: qty 1

## 2022-09-15 MED ORDER — DROPERIDOL 2.5 MG/ML IJ SOLN
1.2500 mg | Freq: Once | INTRAMUSCULAR | Status: AC
  Administered 2022-09-15: 1.25 mg via INTRAVENOUS
  Filled 2022-09-15: qty 2

## 2022-09-15 MED ORDER — SODIUM CHLORIDE (PF) 0.9 % IJ SOLN
INTRAMUSCULAR | Status: AC
  Filled 2022-09-15: qty 50

## 2022-09-15 MED ORDER — INSULIN ASPART 100 UNIT/ML IJ SOLN
10.0000 [IU] | Freq: Once | INTRAMUSCULAR | Status: AC
  Administered 2022-09-15: 10 [IU] via INTRAVENOUS
  Filled 2022-09-15: qty 0.1

## 2022-09-15 MED ORDER — IOHEXOL 300 MG/ML  SOLN
60.0000 mL | Freq: Once | INTRAMUSCULAR | Status: AC | PRN
  Administered 2022-09-15: 60 mL via INTRAVENOUS

## 2022-09-15 MED ORDER — LIDOCAINE VISCOUS HCL 2 % MT SOLN
15.0000 mL | Freq: Once | OROMUCOSAL | Status: AC
  Administered 2022-09-15: 15 mL via ORAL
  Filled 2022-09-15: qty 15

## 2022-09-15 MED ORDER — ALUM & MAG HYDROXIDE-SIMETH 200-200-20 MG/5ML PO SUSP
30.0000 mL | Freq: Once | ORAL | Status: AC
  Administered 2022-09-15: 30 mL via ORAL
  Filled 2022-09-15: qty 30

## 2022-09-15 MED ORDER — ONDANSETRON HCL 4 MG/2ML IJ SOLN
4.0000 mg | Freq: Once | INTRAMUSCULAR | Status: AC
  Administered 2022-09-15: 4 mg via INTRAVENOUS
  Filled 2022-09-15: qty 2

## 2022-09-15 MED ORDER — OXYCODONE-ACETAMINOPHEN 5-325 MG PO TABS
1.0000 | ORAL_TABLET | Freq: Once | ORAL | Status: AC
  Administered 2022-09-15: 1 via ORAL
  Filled 2022-09-15: qty 1

## 2022-09-15 MED ORDER — MAGNESIUM SULFATE 2 GM/50ML IV SOLN
2.0000 g | Freq: Once | INTRAVENOUS | Status: AC
  Administered 2022-09-15: 2 g via INTRAVENOUS
  Filled 2022-09-15: qty 50

## 2022-09-15 MED ORDER — DICYCLOMINE HCL 20 MG PO TABS: 20.0000 mg | ORAL_TABLET | Freq: Two times a day (BID) | ORAL | 0 refills | Status: DC

## 2022-09-15 MED ORDER — LACTATED RINGERS IV BOLUS
1000.0000 mL | Freq: Once | INTRAVENOUS | Status: AC
  Administered 2022-09-15: 1000 mL via INTRAVENOUS

## 2022-09-15 MED ORDER — HYDROMORPHONE HCL 1 MG/ML IJ SOLN
0.5000 mg | Freq: Once | INTRAMUSCULAR | Status: AC
  Administered 2022-09-15: 0.5 mg via INTRAVENOUS
  Filled 2022-09-15: qty 1

## 2022-09-15 NOTE — ED Provider Notes (Signed)
[place] EMERGENCY DEPARTMENT AT [facility] Provider Note  CSN: [id number] Arrival date & time: 09/14/22 2303  Chief Complaint(s) Abdominal Pain  HPI Jennifer [name] is a 32 y.o. female with PMH astrocytoma, chronic systolic CHF, lipoprotein deficiency, diabetes, HTN, hypothyroidism, pancreatitis, PCOS, recent hospital admission and discharged on 09/13/2022 for DKA who presents emergency room for evaluation of persistent abdominal pain nausea vomiting.  Pain is colicky in the epigastrium with associated nausea vomiting and diarrhea.  Denies chest pain, shortness of breath, headache, fever or other systemic symptoms.   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   CHF (congestive heart failure) (HCC)    Cholesterosis    CKD (chronic kidney disease) stage 4, GFR 15-29 ml/min (HCC) 05/13/2021   DM (diabetes mellitus) (HCC) 10/10/2018   Fatty liver    HTN (hypertension) 10/10/2018   Hypothyroidism 10/10/2018   Lipoprotein deficiency    Lung disease    longevity long term   Pancreatitis    Polycystic ovary syndrome    Patient Active Problem List   Diagnosis Date Noted   Hyperbilirubinemia 09/07/2022   UTI (urinary tract infection) due to Enterococcus 06/01/2022   E. coli UTI (urinary tract infection) 06/01/2022   DKA (diabetic ketoacidosis) (HCC) 06/01/2022   ASCUS of cervix with negative high risk HPV 05/31/2022   Pancreatitis 05/29/2022   Hypokalemia 03/23/2022   COVID 03/15/2022   COVID-19 virus infection 03/14/2022   Hyponatremia 03/14/2022   Positive D dimer 03/14/2022   Stage 3b chronic kidney disease (CKD) (HCC) 03/14/2022   Pulmonary hypertension (HCC) 02/06/2022   NASH (nonalcoholic steatohepatitis) 02/06/2022   Obesity (BMI 30-39.9) 02/06/2022   Heart failure (HCC) 12/03/2021   Acute on chronic systolic CHF (congestive heart failure) (HCC) 12/02/2021   Elevated  troponin 12/02/2021   Pancytopenia (HCC) 12/02/2021   Amenorrhea 12/02/2021   Hepatic steatosis 09/27/2021   Hyperosmolar hyperglycemic state (HHS) (HCC) 08/26/2021   Small intestinal bacterial overgrowth (SIBO) 08/26/2021   Lower extremity edema 08/26/2021   Hyperkalemia 08/14/2021   Hx of insulin dependent diabetes mellitus 08/14/2021   CKD (chronic kidney disease) stage 4, GFR 15-29 ml/min (HCC) 05/13/2021   Afib (HCC) 05/12/2021   Gastroparesis due to DM (HCC) 05/12/2021   Abdominal distension 05/12/2021   Chest pain 03/10/2021   Acute on chronic combined systolic and diastolic CHF (congestive heart failure) (HCC) 02/09/2021   History of astrocytoma of brain 02/09/2021   Severe hyperglycemia due to diabetes mellitus (HCC) 01/25/2021   Diarrhea    Elevated transaminase level    Acute combined systolic and diastolic heart failure (HCC)    Nonischemic cardiomyopathy (HCC)    Acute decompensated heart failure (HCC)    Elevated liver enzymes    Acute renal failure superimposed on stage 3a chronic kidney disease (HCC) 11/26/2020   Intractable abdominal pain 11/23/2020   Chronic systolic CHF (congestive heart failure) (HCC) 11/23/2020   Essential hypertension 11/23/2020   Hypertriglyceridemia 11/23/2020   Prolonged QT interval 11/23/2020   Finger mass, left 08/12/2020   Nausea and vomiting 07/23/2020   Polycystic ovary syndrome 01/30/2020   Moderate aortic insufficiency 08/16/2019   Moderate mitral regurgitation 08/16/2019   Lipoprotein lipase deficiency, familial 06/19/2019   Microalbuminuria due to type 1 diabetes mellitus (HCC) 06/19/2019   Type 2 diabetes mellitus with hyperlipidemia (HCC) 10/10/2018   Hypothyroidism 10/10/2018   Home Medication(s) Prior to Admission medications   Medication Sig Start  Date End Date Taking? Authorizing Provider  albuterol (VENTOLIN HFA) 108 (90 Base) MCG/ACT inhaler Inhale 1-2 puffs into the lungs every 6 (six) hours as needed for wheezing  or shortness of breath. 12/31/18  Yes [provider]  aspirin EC 81 MG EC tablet Take 1 tablet (81 mg total) by mouth daily. Swallow whole. 12/07/20  Yes [name], DO  carvedilol (COREG) 6.25 MG tablet Take 1 tablet (6.25 mg total) by mouth 2 (two) times daily. 06/22/22  Yes Milford, Anderson Malta, FNP  CREON [phone] units CPEP capsule Take 36,000 Units by mouth 3 (three) times daily.   Yes [provider]  DULoxetine (CYMBALTA) 30 MG capsule Take 30 mg by mouth at bedtime. 01/28/19 01/27/23 Yes [provider]  ergocalciferol (VITAMIN D2) 1.25 MG (50000 UT) capsule Take 50,000 Units by mouth every 7 (seven) days.   Yes [provider]  Evolocumab (REPATHA) 140 MG/ML SOSY Inject 140 mg into the skin See admin instructions. Inject 140 mg subcutaneously every other Monday evening   Yes [provider]  fenofibrate 160 MG tablet Take 1 tablet (160 mg total) by mouth daily. 12/07/20  Yes [name], DO  hydrALAZINE (APRESOLINE) 50 MG tablet Take 1 tablet (50 mg total) by mouth every 8 (eight) hours. 06/12/22 08/03/23 Yes Pahwani, Daleen Bo, MD  insulin regular human CONCENTRATED (HUMULIN R U-500 KWIKPEN) 500 UNIT/ML KwikPen Inject 120 Units into the skin 3 (three) times daily with meals. Patient taking differently: Inject 185 Units into the skin 3 (three) times daily with meals. 06/01/22  Yes Simonne Martinet, NP  ivabradine (CORLANOR) 7.5 MG TABS tablet Take 1 tablet (7.5 mg total) by mouth 2 (two) times daily with a meal. 08/11/22  Yes [name], MD  levocetirizine (XYZAL) 5 MG tablet Take 5 mg by mouth daily. 03/05/22  Yes [provider]  levothyroxine (SYNTHROID) 125 MCG tablet Take 1 tablet (125 mcg total) by mouth daily at 6 (six) AM. 12/07/20  Yes [name], DO  pantoprazole (PROTONIX) 40 MG tablet Take 40 mg by mouth daily. 12/24/19  Yes [provider]  pregabalin (LYRICA) 300 MG capsule Take 300 mg by mouth at bedtime.    Yes [provider]  prochlorperazine (COMPAZINE) 5 MG tablet Take 5 mg by mouth at bedtime.   Yes [provider]  rosuvastatin (CRESTOR) 40 MG tablet Take 40 mg by mouth daily. 08/09/21  Yes [provider]  sildenafil (REVATIO) 20 MG tablet Take 1 tablet (20 mg total) by mouth 3 (three) times daily. 05/11/22  Yes [name], [name], MD  sodium bicarbonate 650 MG tablet Take 650 mg by mouth 2 (two) times daily.   Yes [provider]  torsemide (DEMADEX) 20 MG tablet Take 40 mg by mouth daily.   Yes [provider]  tretinoin (RETIN-A) 0.05 % cream Apply 1 application topically at bedtime. 01/17/21  Yes [provider]  Insulin Pen Needle 32G X 4 MM MISC 1 Device by Does not apply route QID. For use with insulin pens 02/13/22   [name], MD  spironolactone (ALDACTONE) 25 MG tablet Take 1 tablet (25 mg total) by mouth daily. 08/11/22 09/10/22  [name], MD  Past Surgical History Past Surgical History:  Procedure Laterality Date   ABDOMINAL SURGERY     pt states during miscarriage got her intestine   BRAIN SURGERY     EYE MUSCLE SURGERY Right 03/28/2014   PRESSURE SENSOR/CARDIOMEMS N/A 12/06/2021   Procedure: PRESSURE SENSOR/CARDIOMEMS;  Surgeon: Dolores Patty, MD;  Location: [facility];  Service: Cardiovascular;  Laterality: N/A;   RIGHT HEART CATH N/A 08/05/2021   Procedure: RIGHT HEART CATH;  Surgeon: Dolores Patty, MD;  Location: [facility];  Service: Cardiovascular;  Laterality: N/A;   RIGHT HEART CATH N/A 12/06/2021   Procedure: RIGHT HEART CATH;  Surgeon: Dolores Patty, MD;  Location: [facility];  Service: Cardiovascular;  Laterality: N/A;   RIGHT/LEFT HEART CATH AND CORONARY ANGIOGRAPHY N/A 12/03/2020   Procedure: RIGHT/LEFT HEART CATH AND CORONARY  ANGIOGRAPHY;  Surgeon: Yates Decamp, MD;  Location: [facility]VAS[facility] CV [facility];  Service: Cardiovascular;  Laterality: N/A;   VENTRICULOSTOMY  03/28/1997   Family History Family History  Problem Relation Age of Onset   Diabetes Mother    Hypertension Mother    Hyperlipidemia Mother    Thyroid disease Mother    Hypertension Father    Diabetes Father    Pancreatic cancer Paternal Aunt    Pancreatic cancer Paternal Uncle    Colon cancer Neg Hx    Esophageal cancer Neg Hx    Stomach cancer Neg Hx     Social History Social History   Tobacco Use   Smoking status: Never   Smokeless tobacco: Never  Vaping Use   Vaping Use: Never used  Substance Use Topics   Alcohol use: Never   Drug use: Never   Allergies Ketamine, Erythromycin, Fentanyl, Maitake mushroom [maitake], Penicillin v, Shellfish allergy, Morphine, Penicillins, and Prednisone  Review of Systems Review of Systems  Gastrointestinal:  Positive for abdominal pain, diarrhea, nausea and vomiting.    Physical Exam Vital Signs  I have reviewed the triage vital signs BP (!) 167/98   Pulse (!) 101   Temp 97.8 F (36.6 C) (Oral)   Resp 16   Ht 4\' 9"  (1.448 m)   Wt 59 kg   LMP 08/25/2022 (Exact Date) Comment: negative I-stat beta HCG 09/15/22  SpO2 95%   BMI 28.13 kg/m   Physical Exam Vitals and nursing note reviewed.  Constitutional:      General: [name] not in acute distress.    Appearance: [name] well-developed.  HENT:     Head: Normocephalic and atraumatic.  Eyes:     Conjunctiva/sclera: Conjunctivae normal.  Cardiovascular:     Rate and Rhythm: Normal rate and regular rhythm.     Heart sounds: No murmur heard. Pulmonary:     Effort: Pulmonary effort is normal. No respiratory distress.     Breath sounds: Normal breath sounds.  Abdominal:     Palpations: Abdomen is soft.     Tenderness: There is abdominal tenderness in the epigastric area.  Musculoskeletal:        General: No swelling.     Cervical back: Neck  supple.  Skin:    General: Skin is warm and dry.     Capillary Refill: Capillary refill takes less than 2 seconds.  Neurological:     Mental Status: [name] alert.  Psychiatric:        Mood and Affect: Mood normal.     ED Results and Treatments Labs (all labs ordered are listed, but only abnormal results  are displayed) Labs Reviewed  CBC WITH DIFFERENTIAL/PLATELET - Abnormal; Notable for the following components:      Result Value   Hemoglobin 11.3 (*)    HCT 35.5 (*)    All other components within normal limits  COMPREHENSIVE METABOLIC PANEL - Abnormal; Notable for the following components:   Sodium 128 (*)    Chloride 90 (*)    Glucose, Bld 494 (*)    BUN 46 (*)    Creatinine, Ser 2.04 (*)    Calcium 10.4 (*)    Total Protein 8.6 (*)    AST 56 (*)    ALT 65 (*)    GFR, Estimated 33 (*)    All other components within normal limits  URINALYSIS, ROUTINE W REFLEX MICROSCOPIC - Abnormal; Notable for the following components:   Glucose, UA >=500 (*)    Hgb urine dipstick MODERATE (*)    Protein, ur 100 (*)    All other components within normal limits  BLOOD GAS, VENOUS - Abnormal; Notable for the following components:   pH, Ven 7.44 (*)    pO2, Ven 47 (*)    Bicarbonate 29.9 (*)    Acid-Base Excess 5.0 (*)    All other components within normal limits  CBG MONITORING, ED - Abnormal; Notable for the following components:   Glucose-Capillary 474 (*)    All other components within normal limits  I-STAT CHEM 8, ED - Abnormal; Notable for the following components:   Sodium 130 (*)    Chloride 96 (*)    BUN 44 (*)    Creatinine, Ser 2.10 (*)    Glucose, Bld 480 (*)    Hemoglobin 11.2 (*)    HCT 33.0 (*)    All other components within normal limits  CBG MONITORING, ED - Abnormal; Notable for the following components:   Glucose-Capillary 319 (*)    All other components within normal limits  LIPASE, BLOOD  BETA-HYDROXYBUTYRIC ACID  I-STAT BETA HCG BLOOD, ED ([facility], [facility], [facility]  ONLY)                                                                                                                          Radiology CT ABDOMEN PELVIS W CONTRAST  Result Date: 09/15/2022 CLINICAL DATA:  Abdominal pain with nausea and vomiting EXAM: CT ABDOMEN AND PELVIS WITH CONTRAST TECHNIQUE: Multidetector CT imaging of the abdomen and pelvis was performed using the standard protocol following bolus administration of intravenous contrast. RADIATION DOSE REDUCTION: This exam was performed according to the departmental dose-optimization program which includes automated exposure control, adjustment of the mA and/or kV according to patient size and/or use of iterative reconstruction technique. CONTRAST:  60mL OMNIPAQUE IOHEXOL 300 MG/ML  SOLN COMPARISON:  08/31/2022 FINDINGS: Lower chest: No acute abnormality. Hepatobiliary: No focal liver abnormality is seen. No gallstones, gallbladder wall thickening, or biliary dilatation. Pancreas: Unremarkable. No pancreatic ductal dilatation or surrounding inflammatory changes. Spleen: Spleen again demonstrates an area of decreased enhancement posteriorly  although improved from the prior exam consistent with resolving infarct. Adrenals/Urinary Tract: Adrenal glands are within normal limits. Kidneys demonstrate a normal enhancement pattern bilaterally. No calculi are seen. The bladder is within normal limits. Stomach/Bowel: Appendix is within normal limits. No obstructive or inflammatory changes of the colon are seen. The stomach and small bowel are within normal limits. Vascular/Lymphatic: Aortic atherosclerosis. No enlarged abdominal or pelvic lymph nodes. Reproductive: Uterus and bilateral adnexa are unremarkable. Other: No abdominal wall hernia or abnormality. No abdominopelvic ascites. Musculoskeletal: No acute or significant osseous findings. IMPRESSION: No acute abnormality is identified correspond with the given clinical history. Resolving enhancement  abnormality in the spleen consistent with resolving infarct. No other focal abnormality is noted. Electronically Signed   By: Alcide Clever M.D.   On: 09/15/2022 03:02    Pertinent labs & imaging results that were available during my care of the patient were reviewed by me and considered in my medical decision making (see MDM for details).  Medications Ordered in ED Medications  droperidol (INAPSINE) 2.5 MG/ML injection 1.25 mg (has no administration in time range)  LORazepam (ATIVAN) injection 0.5 mg (has no administration in time range)  lactated ringers bolus 1,000 mL (0 mLs Intravenous Stopped 09/15/22 0151)  ketorolac (TORADOL) 15 MG/ML injection 15 mg (15 mg Intramuscular Given 09/15/22 0017)  ondansetron (ZOFRAN) injection 4 mg (4 mg Intravenous Given 09/15/22 0017)  oxyCODONE-acetaminophen (PERCOCET/ROXICET) 5-325 MG per tablet 1 tablet (1 tablet Oral Given 09/15/22 0057)  iohexol (OMNIPAQUE) 300 MG/ML solution 60 mL (60 mLs Intravenous Contrast Given 09/15/22 0247)  HYDROmorphone (DILAUDID) injection 0.5 mg (0.5 mg Intravenous Given 09/15/22 0158)  insulin aspart (novoLOG) injection 10 Units (10 Units Intravenous Given 09/15/22 0318)  alum & mag hydroxide-simeth (MAALOX/MYLANTA) 200-200-20 MG/5ML suspension 30 mL (30 mLs Oral Given 09/15/22 0331)    And  lidocaine (XYLOCAINE) 2 % viscous mouth solution 15 mL (15 mLs Oral Given 09/15/22 0331)                                                                                                                                     Procedures Procedures  (including critical care time)  Medical Decision Making / ED Course   This patient presents to the ED for concern of abdominal pain, this involves an extensive number of treatment options, and is a complaint that carries with it a high risk of complications and morbidity.  The differential diagnosis includes GERD/gastritis, peptic ulcer disease, pancreatitis, gastroparesis, pneumonia, pleurisy,  pericarditis  MDM: Patient seen emergency room for evaluation of abdominal pain.  Physical exam with epigastric tenderness to palpation but is otherwise unremarkable.  Laboratory evaluation with pseudohyponatremia 128, initial glucose 494, BUN 46, creatinine 2.04 which is near patient's baseline.  AST 56, ALT 65.  Urinalysis unremarkable.  CBC without leukocytosis.  Lipase is normal.  CT abdomen pelvis unremarkable outside of a resolving splenic infarct.  While this may  cause pain in the left upper quadrant, patient's pain appears to have started in the last 24 hours and this infarct was seen on previous CTs when she did not have pain so I have lower suspicion that this is the underlying source of her pain.  Given her recent DKA, elevated blood sugar, higher suspicion for gastroparesis and patient ultimately pain controlled with droperidol use.  Patient able to tolerate p.o. without difficulty here in the emergency department and as symptoms have improved she is safe for discharge with outpatient follow-up.  Patient be discharged on Bent[place] with outpatient follow-up.  Return precautions given which she voiced understanding.   Additional history obtained:  -External records from outside source obtained and reviewed including: Chart review including previous notes, labs, imaging, consultation notes   Lab Tests: -I ordered, reviewed, and interpreted labs.   The pertinent results include:   Labs Reviewed  CBC WITH DIFFERENTIAL/PLATELET - Abnormal; Notable for the following components:      Result Value   Hemoglobin 11.3 (*)    HCT 35.5 (*)    All other components within normal limits  COMPREHENSIVE METABOLIC PANEL - Abnormal; Notable for the following components:   Sodium 128 (*)    Chloride 90 (*)    Glucose, Bld 494 (*)    BUN 46 (*)    Creatinine, Ser 2.04 (*)    Calcium 10.4 (*)    Total Protein 8.6 (*)    AST 56 (*)    ALT 65 (*)    GFR, Estimated 33 (*)    All other components within  normal limits  URINALYSIS, ROUTINE W REFLEX MICROSCOPIC - Abnormal; Notable for the following components:   Glucose, UA >=500 (*)    Hgb urine dipstick MODERATE (*)    Protein, ur 100 (*)    All other components within normal limits  BLOOD GAS, VENOUS - Abnormal; Notable for the following components:   pH, Ven 7.44 (*)    pO2, Ven 47 (*)    Bicarbonate 29.9 (*)    Acid-Base Excess 5.0 (*)    All other components within normal limits  CBG MONITORING, ED - Abnormal; Notable for the following components:   Glucose-Capillary 474 (*)    All other components within normal limits  I-STAT CHEM 8, ED - Abnormal; Notable for the following components:   Sodium 130 (*)    Chloride 96 (*)    BUN 44 (*)    Creatinine, Ser 2.10 (*)    Glucose, Bld 480 (*)    Hemoglobin 11.2 (*)    HCT 33.0 (*)    All other components within normal limits  CBG MONITORING, ED - Abnormal; Notable for the following components:   Glucose-Capillary 319 (*)    All other components within normal limits  LIPASE, BLOOD  BETA-HYDROXYBUTYRIC ACID  I-STAT BETA HCG BLOOD, ED ([facility], [facility], [facility] ONLY)      EKG   EKG Interpretation  Date/Time:  Wednesday September 14 2022 23:20:48 EDT Ventricular Rate:  119 PR Interval:  118 QRS Duration: 99 QT Interval:  342 QTC Calculation: 482 R Axis:   38 Text Interpretation: Sinus tachycardia LVH with secondary repolarization abnormality Confirmed by Mahitha Hickling ([id number]) on 09/15/2022 3:44:46 AM         Imaging Studies ordered: I ordered imaging studies including CTAP I independently visualized and interpreted imaging. I agree with the radiologist interpretation   Medicines ordered and prescription drug management: Meds ordered this encounter  Medications   lactated  ringers bolus 1,000 mL   ketorolac (TORADOL) 15 MG/ML injection 15 mg   ondansetron (ZOFRAN) injection 4 mg   oxyCODONE-acetaminophen (PERCOCET/ROXICET) 5-325 MG per tablet 1 tablet   iohexol (OMNIPAQUE) 300 MG/ML  solution 60 mL   HYDROmorphone (DILAUDID) injection 0.5 mg   insulin aspart (novoLOG) injection 10 Units   AND Linked Order Group    alum & mag hydroxide-simeth (MAALOX/MYLANTA) 200-200-20 MG/5ML suspension 30 mL    lidocaine (XYLOCAINE) 2 % viscous mouth solution 15 mL   droperidol (INAPSINE) 2.5 MG/ML injection 1.25 mg   LORazepam (ATIVAN) injection 0.5 mg    -I have reviewed the patients home medicines and have made adjustments as needed  Critical interventions none   Cardiac Monitoring: The patient was maintained on a cardiac monitor.  I personally viewed and interpreted the cardiac monitored which showed an underlying rhythm of: nsr, sinus tachycardia  Social Determinants of Health:  Factors impacting patients care include: none   Reevaluation: After the interventions noted above, I reevaluated the patient and found that they have :improved  Co morbidities that complicate the patient evaluation  Past Medical History:  Diagnosis Date   Afib (HCC) 05/12/2021   Brain tumor (HCC) 03/29/1995   astrocytoma   CHF (congestive heart failure) (HCC)    Cholesterosis    CKD (chronic kidney disease) stage 4, GFR 15-29 ml/min (HCC) 05/13/2021   DM (diabetes mellitus) (HCC) 10/10/2018   Fatty liver    HTN (hypertension) 10/10/2018   Hypothyroidism 10/10/2018   Lipoprotein deficiency    Lung disease    longevity long term   Pancreatitis    Polycystic ovary syndrome       Dispostion: I considered admission for this patient, but at this time she does not meet inpatient criteria for admission and she is safe for discharge with follow-up     Final Clinical Impression(s) / ED Diagnoses Final diagnoses:  None     @PCDICTATION @    Glendora Score, MD 09/15/22 [phone]

## 2022-09-18 ENCOUNTER — Observation Stay (HOSPITAL_COMMUNITY)
Admission: EM | Admit: 2022-09-18 | Discharge: 2022-09-20 | Disposition: A | Discharge status: Home / Self Care | Payer: Medicaid Other | Attending: Internal Medicine | Admitting: Internal Medicine

## 2022-09-18 DIAGNOSIS — N184 Chronic kidney disease, stage 4 (severe): Secondary | ICD-10-CM | POA: Diagnosis not present

## 2022-09-18 DIAGNOSIS — R1084 Generalized abdominal pain: Secondary | ICD-10-CM | POA: Diagnosis not present

## 2022-09-18 DIAGNOSIS — I272 Pulmonary hypertension, unspecified: Secondary | ICD-10-CM | POA: Insufficient documentation

## 2022-09-18 DIAGNOSIS — Z79899 Other long term (current) drug therapy: Secondary | ICD-10-CM | POA: Diagnosis not present

## 2022-09-18 DIAGNOSIS — E871 Hypo-osmolality and hyponatremia: Secondary | ICD-10-CM | POA: Insufficient documentation

## 2022-09-18 DIAGNOSIS — E1165 Type 2 diabetes mellitus with hyperglycemia: Principal | ICD-10-CM | POA: Insufficient documentation

## 2022-09-18 DIAGNOSIS — E1122 Type 2 diabetes mellitus with diabetic chronic kidney disease: Secondary | ICD-10-CM | POA: Insufficient documentation

## 2022-09-18 DIAGNOSIS — I10 Essential (primary) hypertension: Secondary | ICD-10-CM | POA: Diagnosis not present

## 2022-09-18 DIAGNOSIS — I5022 Chronic systolic (congestive) heart failure: Secondary | ICD-10-CM | POA: Insufficient documentation

## 2022-09-18 DIAGNOSIS — Z7982 Long term (current) use of aspirin: Secondary | ICD-10-CM | POA: Diagnosis not present

## 2022-09-18 DIAGNOSIS — Z794 Long term (current) use of insulin: Secondary | ICD-10-CM | POA: Diagnosis not present

## 2022-09-18 DIAGNOSIS — E039 Hypothyroidism, unspecified: Secondary | ICD-10-CM | POA: Diagnosis not present

## 2022-09-18 DIAGNOSIS — R739 Hyperglycemia, unspecified: Secondary | ICD-10-CM | POA: Diagnosis not present

## 2022-09-18 DIAGNOSIS — I130 Hypertensive heart and chronic kidney disease with heart failure and stage 1 through stage 4 chronic kidney disease, or unspecified chronic kidney disease: Secondary | ICD-10-CM | POA: Insufficient documentation

## 2022-09-18 DIAGNOSIS — R110 Nausea: Secondary | ICD-10-CM | POA: Diagnosis not present

## 2022-09-18 LAB — CBG MONITORING, ED: Glucose-Capillary: 487 mg/dL — ABNORMAL HIGH (ref 70–99)

## 2022-09-18 NOTE — ED Triage Notes (Signed)
Patient here from home reporting hyperglycemia CBG 584. States that she ran out of insulin 2 days ago and is suppose to refill tomorrow.

## 2022-09-19 ENCOUNTER — Encounter (HOSPITAL_COMMUNITY): Payer: Self-pay | Admitting: Internal Medicine

## 2022-09-19 ENCOUNTER — Other Ambulatory Visit: Payer: Self-pay

## 2022-09-19 DIAGNOSIS — R739 Hyperglycemia, unspecified: Secondary | ICD-10-CM | POA: Diagnosis not present

## 2022-09-19 LAB — GLUCOSE, CAPILLARY
Glucose-Capillary: 420 mg/dL — ABNORMAL HIGH (ref 70–99)
Glucose-Capillary: 379 mg/dL — ABNORMAL HIGH (ref 70–99)
Glucose-Capillary: 90 mg/dL (ref 70–99)

## 2022-09-19 LAB — URINALYSIS, ROUTINE W REFLEX MICROSCOPIC
Bilirubin Urine: NEGATIVE
Glucose, UA: >=500 mg/dL — AB
Ketones, ur: NEGATIVE mg/dL
Leukocytes,Ua: NEGATIVE
Nitrite: NEGATIVE
Protein, ur: 100 mg/dL — AB
Specific Gravity, Urine: 1.016 (ref 1.005–1.030)
pH: 5 (ref 5.0–8.0)

## 2022-09-19 LAB — BLOOD GAS, VENOUS
Acid-base deficit: 4.5 mmol/L — ABNORMAL HIGH (ref 0.0–2.0)
Bicarbonate: 21.1 mmol/L (ref 20.0–28.0)
Patient temperature: 37
pCO2, Ven: 40 mmHg — ABNORMAL LOW (ref 44–60)
pH, Ven: 7.33 (ref 7.25–7.43)
pO2, Ven: 52 mmHg — ABNORMAL HIGH (ref 32–45)

## 2022-09-19 LAB — BASIC METABOLIC PANEL
Anion gap: 11 (ref 5–15)
Anion gap: 14 (ref 5–15)
BUN: 69 mg/dL — ABNORMAL HIGH (ref 6–20)
BUN: 56 mg/dL — ABNORMAL HIGH (ref 6–20)
CO2: 16 mmol/L — ABNORMAL LOW (ref 22–32)
CO2: 14 mmol/L — ABNORMAL LOW (ref 22–32)
Calcium: 8.5 mg/dL — ABNORMAL LOW (ref 8.9–10.3)
Calcium: 8.5 mg/dL — ABNORMAL LOW (ref 8.9–10.3)
Chloride: 91 mmol/L — ABNORMAL LOW (ref 98–111)
Chloride: 104 mmol/L (ref 98–111)
Creatinine, Ser: 2.08 mg/dL — ABNORMAL HIGH (ref 0.44–1.00)
Creatinine, Ser: 1.92 mg/dL — ABNORMAL HIGH (ref 0.44–1.00)
GFR, Estimated: 32 mL/min — ABNORMAL LOW (ref >60)
GFR, Estimated: 35 mL/min — ABNORMAL LOW (ref >60)
Glucose, Bld: 505 mg/dL (ref 70–99)
Glucose, Bld: 437 mg/dL — ABNORMAL HIGH (ref 70–99)
Potassium: >7.5 mmol/L (ref 3.5–5.1)
Potassium: 4.9 mmol/L (ref 3.5–5.1)
Sodium: 118 mmol/L — CL (ref 135–145)
Sodium: 132 mmol/L — ABNORMAL LOW (ref 135–145)

## 2022-09-19 LAB — CBC
HCT: UNDETERMINED % (ref 36.0–46.0)
Hemoglobin: 13.4 g/dL (ref 12.0–15.0)
MCH: UNDETERMINED pg (ref 26.0–34.0)
MCHC: UNDETERMINED g/dL (ref 30.0–36.0)
MCV: UNDETERMINED fL (ref 80.0–100.0)
Platelets: 263 10*3/uL (ref 150–400)
RBC: UNDETERMINED MIL/uL (ref 3.87–5.11)
RDW: UNDETERMINED % (ref 11.5–15.5)
WBC: 6.5 10*3/uL (ref 4.0–10.5)
nRBC: 0 % (ref 0.0–0.2)

## 2022-09-19 LAB — CBG MONITORING, ED
Glucose-Capillary: 559 mg/dL (ref 70–99)
Glucose-Capillary: 417 mg/dL — ABNORMAL HIGH (ref 70–99)
Glucose-Capillary: 357 mg/dL — ABNORMAL HIGH (ref 70–99)
Glucose-Capillary: 222 mg/dL — ABNORMAL HIGH (ref 70–99)

## 2022-09-19 LAB — BETA-HYDROXYBUTYRIC ACID: Beta-Hydroxybutyric Acid: 0.86 mmol/L — ABNORMAL HIGH (ref 0.05–0.27)

## 2022-09-19 LAB — LIPASE, BLOOD: Lipase: 39 U/L (ref 11–51)

## 2022-09-19 LAB — POTASSIUM: Potassium: 4.7 mmol/L (ref 3.5–5.1)

## 2022-09-19 LAB — PREGNANCY, URINE: Preg Test, Ur: NEGATIVE

## 2022-09-19 MED ORDER — DICYCLOMINE HCL 10 MG PO CAPS
10.0000 mg | ORAL_CAPSULE | Freq: Once | ORAL | Status: AC
  Administered 2022-09-19: 10 mg via ORAL
  Filled 2022-09-19: qty 1

## 2022-09-19 MED ORDER — INSULIN ASPART 100 UNIT/ML IJ SOLN
4.0000 [IU] | Freq: Three times a day (TID) | INTRAMUSCULAR | Status: DC
  Administered 2022-09-19 – 2022-09-20 (×2): 4 [IU] via SUBCUTANEOUS

## 2022-09-19 MED ORDER — FAMOTIDINE IN NACL 20-0.9 MG/50ML-% IV SOLN
20.0000 mg | Freq: Once | INTRAVENOUS | Status: AC
  Administered 2022-09-19: 20 mg via INTRAVENOUS
  Filled 2022-09-19: qty 50

## 2022-09-19 MED ORDER — SPIRONOLACTONE 25 MG PO TABS
25.0000 mg | ORAL_TABLET | Freq: Every day | ORAL | Status: DC
  Administered 2022-09-19 – 2022-09-20 (×2): 25 mg via ORAL
  Filled 2022-09-19 (×2): qty 1

## 2022-09-19 MED ORDER — LACTATED RINGERS IV BOLUS
1000.0000 mL | Freq: Once | INTRAVENOUS | Status: AC
  Administered 2022-09-19: 1000 mL via INTRAVENOUS

## 2022-09-19 MED ORDER — SILDENAFIL CITRATE 20 MG PO TABS
20.0000 mg | ORAL_TABLET | Freq: Three times a day (TID) | ORAL | Status: DC
  Administered 2022-09-19 – 2022-09-20 (×3): 20 mg via ORAL
  Filled 2022-09-19 (×4): qty 1

## 2022-09-19 MED ORDER — HYDROMORPHONE HCL 2 MG PO TABS
2.0000 mg | ORAL_TABLET | ORAL | Status: DC | PRN
  Administered 2022-09-19 – 2022-09-20 (×3): 2 mg via ORAL
  Filled 2022-09-19 (×3): qty 1

## 2022-09-19 MED ORDER — INSULIN ASPART 100 UNIT/ML IJ SOLN
10.0000 [IU] | Freq: Once | INTRAMUSCULAR | Status: AC
  Administered 2022-09-19: 10 [IU] via SUBCUTANEOUS
  Filled 2022-09-19: qty 0.1

## 2022-09-19 MED ORDER — ENOXAPARIN SODIUM 30 MG/0.3ML IJ SOSY
30.0000 mg | PREFILLED_SYRINGE | INTRAMUSCULAR | Status: DC
  Administered 2022-09-19 – 2022-09-20 (×2): 30 mg via SUBCUTANEOUS
  Filled 2022-09-19 (×2): qty 0.3

## 2022-09-19 MED ORDER — INSULIN ASPART 100 UNIT/ML IJ SOLN
0.0000 [IU] | Freq: Every day | INTRAMUSCULAR | Status: DC
  Filled 2022-09-19: qty 0.05

## 2022-09-19 MED ORDER — SODIUM BICARBONATE 650 MG PO TABS
650.0000 mg | ORAL_TABLET | Freq: Two times a day (BID) | ORAL | Status: DC
  Administered 2022-09-19 – 2022-09-20 (×3): 650 mg via ORAL
  Filled 2022-09-19 (×3): qty 1

## 2022-09-19 MED ORDER — SODIUM CHLORIDE 0.9 % IV BOLUS
1000.0000 mL | Freq: Once | INTRAVENOUS | Status: AC
  Administered 2022-09-19: 1000 mL via INTRAVENOUS

## 2022-09-19 MED ORDER — SODIUM CHLORIDE 0.9 % IV SOLN: INTRAVENOUS | Status: AC

## 2022-09-19 MED ORDER — PANCRELIPASE (LIP-PROT-AMYL) 36000-114000 UNITS PO CPEP
36000.0000 [IU] | ORAL_CAPSULE | Freq: Three times a day (TID) | ORAL | Status: DC
  Administered 2022-09-19 – 2022-09-20 (×3): 36000 [IU] via ORAL
  Filled 2022-09-19 (×3): qty 1

## 2022-09-19 MED ORDER — LEVOTHYROXINE SODIUM 125 MCG PO TABS
125.0000 ug | ORAL_TABLET | Freq: Every day | ORAL | Status: DC
  Administered 2022-09-20: 125 ug via ORAL
  Filled 2022-09-19: qty 1

## 2022-09-19 MED ORDER — ACETAMINOPHEN 650 MG RE SUPP: 650.0000 mg | Freq: Four times a day (QID) | RECTAL | Status: DC | PRN

## 2022-09-19 MED ORDER — ROSUVASTATIN CALCIUM 10 MG PO TABS
40.0000 mg | ORAL_TABLET | Freq: Every day | ORAL | Status: DC
  Administered 2022-09-19 – 2022-09-20 (×2): 40 mg via ORAL
  Filled 2022-09-19: qty 2
  Filled 2022-09-19: qty 4

## 2022-09-19 MED ORDER — ACETAMINOPHEN 325 MG PO TABS
650.0000 mg | ORAL_TABLET | Freq: Four times a day (QID) | ORAL | Status: DC | PRN
  Administered 2022-09-20: 650 mg via ORAL
  Filled 2022-09-19: qty 2

## 2022-09-19 MED ORDER — INSULIN ASPART 100 UNIT/ML IJ SOLN
0.0000 [IU] | Freq: Three times a day (TID) | INTRAMUSCULAR | Status: DC
  Administered 2022-09-19 (×2): 15 [IU] via SUBCUTANEOUS
  Administered 2022-09-20: 3 [IU] via SUBCUTANEOUS
  Filled 2022-09-19: qty 0.15

## 2022-09-19 MED ORDER — IVABRADINE HCL 7.5 MG PO TABS: 7.5000 mg | ORAL_TABLET | Freq: Two times a day (BID) | ORAL | Status: DC

## 2022-09-19 MED ORDER — TRAZODONE HCL 50 MG PO TABS: 25.0000 mg | ORAL_TABLET | Freq: Every evening | ORAL | Status: DC | PRN

## 2022-09-19 MED ORDER — LACTATED RINGERS IV BOLUS
20.0000 mL/kg | Freq: Once | INTRAVENOUS | Status: AC
  Administered 2022-09-19: 1180 mL via INTRAVENOUS

## 2022-09-19 MED ORDER — FENOFIBRATE 160 MG PO TABS
160.0000 mg | ORAL_TABLET | Freq: Every day | ORAL | Status: DC
  Administered 2022-09-19 – 2022-09-20 (×2): 160 mg via ORAL
  Filled 2022-09-19 (×2): qty 1

## 2022-09-19 MED ORDER — ASPIRIN 81 MG PO TBEC
81.0000 mg | DELAYED_RELEASE_TABLET | Freq: Every day | ORAL | Status: DC
  Administered 2022-09-19 – 2022-09-20 (×2): 81 mg via ORAL
  Filled 2022-09-19 (×2): qty 1

## 2022-09-19 MED ORDER — DULOXETINE HCL 30 MG PO CPEP
30.0000 mg | ORAL_CAPSULE | Freq: Every day | ORAL | Status: DC
  Administered 2022-09-19: 30 mg via ORAL
  Filled 2022-09-19: qty 1

## 2022-09-19 MED ORDER — IVABRADINE 2.5 MG HALF TABLET
2.5000 mg | ORAL_TABLET | Freq: Two times a day (BID) | ORAL | Status: DC
  Administered 2022-09-19 – 2022-09-20 (×2): 2.5 mg via ORAL
  Filled 2022-09-19 (×2): qty 1

## 2022-09-19 MED ORDER — ONDANSETRON HCL 4 MG/2ML IJ SOLN: 4.0000 mg | Freq: Four times a day (QID) | INTRAMUSCULAR | Status: DC | PRN

## 2022-09-19 MED ORDER — PROCHLORPERAZINE MALEATE 10 MG PO TABS
5.0000 mg | ORAL_TABLET | Freq: Every day | ORAL | Status: DC
  Administered 2022-09-19: 5 mg via ORAL
  Filled 2022-09-19: qty 1

## 2022-09-19 MED ORDER — HYDROMORPHONE HCL 1 MG/ML IJ SOLN
0.5000 mg | Freq: Once | INTRAMUSCULAR | Status: AC
  Administered 2022-09-19: 0.5 mg via INTRAVENOUS
  Filled 2022-09-19: qty 1

## 2022-09-19 MED ORDER — METOCLOPRAMIDE HCL 5 MG/ML IJ SOLN
10.0000 mg | Freq: Once | INTRAMUSCULAR | Status: AC
  Administered 2022-09-19: 10 mg via INTRAVENOUS
  Filled 2022-09-19: qty 2

## 2022-09-19 MED ORDER — PANTOPRAZOLE SODIUM 40 MG PO TBEC
40.0000 mg | DELAYED_RELEASE_TABLET | Freq: Every day | ORAL | Status: DC
  Administered 2022-09-19 – 2022-09-20 (×2): 40 mg via ORAL
  Filled 2022-09-19 (×2): qty 1

## 2022-09-19 MED ORDER — INSULIN GLARGINE-YFGN 100 UNIT/ML ~~LOC~~ SOLN
60.0000 [IU] | Freq: Two times a day (BID) | SUBCUTANEOUS | Status: DC
  Administered 2022-09-19 – 2022-09-20 (×2): 60 [IU] via SUBCUTANEOUS
  Filled 2022-09-19 (×3): qty 0.6

## 2022-09-19 MED ORDER — CARVEDILOL 6.25 MG PO TABS
6.2500 mg | ORAL_TABLET | Freq: Two times a day (BID) | ORAL | Status: DC
  Administered 2022-09-19 – 2022-09-20 (×3): 6.25 mg via ORAL
  Filled 2022-09-19: qty 2
  Filled 2022-09-19 (×2): qty 1

## 2022-09-19 MED ORDER — IVABRADINE HCL 5 MG PO TABS
5.0000 mg | ORAL_TABLET | Freq: Two times a day (BID) | ORAL | Status: DC
  Administered 2022-09-19 – 2022-09-20 (×2): 5 mg via ORAL
  Filled 2022-09-19 (×2): qty 1

## 2022-09-19 MED ORDER — PREGABALIN 75 MG PO CAPS
300.0000 mg | ORAL_CAPSULE | Freq: Every day | ORAL | Status: DC
  Administered 2022-09-19: 300 mg via ORAL
  Filled 2022-09-19: qty 4

## 2022-09-19 MED ORDER — HYDRALAZINE HCL 50 MG PO TABS
50.0000 mg | ORAL_TABLET | Freq: Three times a day (TID) | ORAL | Status: DC
  Administered 2022-09-19 – 2022-09-20 (×4): 50 mg via ORAL
  Filled 2022-09-19 (×2): qty 1
  Filled 2022-09-19: qty 2
  Filled 2022-09-19: qty 1

## 2022-09-19 MED ORDER — LORATADINE 10 MG PO TABS
10.0000 mg | ORAL_TABLET | Freq: Every day | ORAL | Status: DC
  Administered 2022-09-19 – 2022-09-20 (×2): 10 mg via ORAL
  Filled 2022-09-19 (×2): qty 1

## 2022-09-19 MED ORDER — INSULIN REGULAR HUMAN (CONC) 500 UNIT/ML ~~LOC~~ SOPN
100.0000 [IU] | PEN_INJECTOR | Freq: Three times a day (TID) | SUBCUTANEOUS | Status: DC
  Administered 2022-09-19: 100 [IU] via SUBCUTANEOUS
  Filled 2022-09-19: qty 3

## 2022-09-19 NOTE — H&P (Addendum)
History and Physical  Jennifer [name] ZOX:[id number] DOB: [birth year] DOA: 09/18/2022  PCP: Nathaneil Canary, PA-C   Chief Complaint: Hyperglycemia  HPI: Jennifer [name] is a 32 y.o. female with medical history significant for chronic systolic congestive heart failure, familial hypertriglyceridemia, astrocytoma, chronic abdominal pain, PCOS, CKD stage IV, insulin-dependent type 2 diabetes and recent hospital stay for diabetic ketoacidosis being admitted to the hospital with hyperglycemia without DKA in the setting of running out of her insulin on 6/21.  Patient states that she has overall been doing well since her hospital discharge on 6/18, save for abdominal pain which is chronic and of unclear etiology according to the patient.  She denies any fevers, chills, nausea, vomiting.  No diarrhea.  States that she has been compliant with her medications, blood sugars have been acceptable.  However she ran out of her insulin on 6/21 and is still waiting for the pharmacy to refill.  She decided to come to the emergency department due to vague abdominal pain, nausea and hyperglycemia.  ED Course: On presentation to the emergency department, she was tachycardic, hypertensive to 179/107, saturating well on room air.  Lab work was done, blood sugar 559, sodium 118, creatinine 2.08, beta hydroxybutyrate 0.86, normal anion gap.  CBC with normal white blood cell count and platelets.  Unable to determine hemoglobin due to cold agglutinin however was 11.24 days ago.  Note initial sample was hemolyzed, repeat potassium at 7 AM was 4.7.  She was given 10 units of IV insulin, and has received over 3 L of LR, current most recent blood sugar is 222.  Review of Systems: Please see HPI for pertinent positives and negatives. A complete 10 system review of systems are otherwise negative.  Past Medical History:  Diagnosis Date   Afib (HCC) 05/12/2021   Brain tumor (HCC) 03/29/1995   astrocytoma   CHF (congestive heart  failure) (HCC)    Cholesterosis    CKD (chronic kidney disease) stage 4, GFR 15-29 ml/min (HCC) 05/13/2021   DM (diabetes mellitus) (HCC) 10/10/2018   Fatty liver    HTN (hypertension) 10/10/2018   Hypothyroidism 10/10/2018   Lipoprotein deficiency    Lung disease    longevity long term   Pancreatitis    Polycystic ovary syndrome    Past Surgical History:  Procedure Laterality Date   ABDOMINAL SURGERY     pt states during miscarriage got her intestine   BRAIN SURGERY     EYE MUSCLE SURGERY Right 03/28/2014   PRESSURE SENSOR/CARDIOMEMS N/A 12/06/2021   Procedure: PRESSURE SENSOR/CARDIOMEMS;  Surgeon: Dolores Patty, MD;  Location: [facility];  Service: Cardiovascular;  Laterality: N/A;   RIGHT HEART CATH N/A 08/05/2021   Procedure: RIGHT HEART CATH;  Surgeon: Dolores Patty, MD;  Location: [facility];  Service: Cardiovascular;  Laterality: N/A;   RIGHT HEART CATH N/A 12/06/2021   Procedure: RIGHT HEART CATH;  Surgeon: Dolores Patty, MD;  Location: [facility];  Service: Cardiovascular;  Laterality: N/A;   RIGHT/LEFT HEART CATH AND CORONARY ANGIOGRAPHY N/A 12/03/2020   Procedure: RIGHT/LEFT HEART CATH AND CORONARY ANGIOGRAPHY;  Surgeon: Yates Decamp, MD;  Location: [facility];  Service: Cardiovascular;  Laterality: N/A;   VENTRICULOSTOMY  03/28/1997    Social History:  reports that she has never smoked. She has never used smokeless tobacco. She reports that she does not drink alcohol and does not use drugs.   Allergies  Allergen Reactions   Ketamine Other (See  Comments)    In vegetative state for 15 minutes per pt   Erythromycin     unk   Fentanyl Nausea And Vomiting   Maitake Mushroom [Maitake] Itching    Itchy throat   Penicillin V Itching    Gi upset   Shellfish Allergy Other (See Comments)    Pt has never had shellfish but tested positive on allergy test. Pt states contrast in CT is okay   Morphine Itching and Rash   Penicillins  Rash    Has patient had a PCN reaction causing immediate rash, facial/tongue/throat swelling, SOB or lightheadedness with hypotension: Y Has patient had a PCN reaction causing severe rash involving mucus membranes or skin necrosis: Y Has patient had a PCN reaction that required hospitalization: N Has patient had a PCN reaction occurring within the last 10 years: Y If all of the above answers are "NO", then may proceed with Cephalosporin use.    Prednisone Rash    Family History  Problem Relation Age of Onset   Diabetes Mother    Hypertension Mother    Hyperlipidemia Mother    Thyroid disease Mother    Hypertension Father    Diabetes Father    Pancreatic cancer Paternal Aunt    Pancreatic cancer Paternal Uncle    Colon cancer Neg Hx    Esophageal cancer Neg Hx    Stomach cancer Neg Hx      Prior to Admission medications   Medication Sig Start Date End Date Taking? Authorizing Provider  albuterol (VENTOLIN HFA) 108 (90 Base) MCG/ACT inhaler Inhale 1-2 puffs into the lungs every 6 (six) hours as needed for wheezing or shortness of breath. 12/31/18  Yes [provider]  aspirin EC 81 MG EC tablet Take 1 tablet (81 mg total) by mouth daily. Swallow whole. 12/07/20  Yes [name], DO  carvedilol (COREG) 6.25 MG tablet Take 1 tablet (6.25 mg total) by mouth 2 (two) times daily. 06/22/22  Yes Milford, Anderson Malta, FNP  CREON [phone] units CPEP capsule Take 36,000 Units by mouth 3 (three) times daily.   Yes [provider]  dicyclomine (BENTYL) 20 MG tablet Take 1 tablet (20 mg total) by mouth 2 (two) times daily. 09/15/22  Yes [name], [name], MD  DULoxetine (CYMBALTA) 30 MG capsule Take 30 mg by mouth at bedtime. 01/28/19 01/27/23 Yes [provider]  ergocalciferol (VITAMIN D2) 1.25 MG (50000 UT) capsule Take 50,000 Units by mouth every 7 (seven) days.   Yes [provider]  Evolocumab (REPATHA) 140 MG/ML SOSY Inject 140 mg into the skin See admin  instructions. Inject 140 mg subcutaneously every other Monday evening   Yes [provider]  fenofibrate 160 MG tablet Take 1 tablet (160 mg total) by mouth daily. 12/07/20  Yes [name], DO  hydrALAZINE (APRESOLINE) 50 MG tablet Take 1 tablet (50 mg total) by mouth every 8 (eight) hours. 06/12/22 08/03/23 Yes Pahwani, Daleen Bo, MD  insulin regular human CONCENTRATED (HUMULIN R U-500 KWIKPEN) 500 UNIT/ML KwikPen Inject 120 Units into the skin 3 (three) times daily with meals. Patient taking differently: Inject 185 Units into the skin 3 (three) times daily with meals. 06/01/22  Yes Simonne Martinet, NP  ivabradine (CORLANOR) 7.5 MG TABS tablet Take 1 tablet (7.5 mg total) by mouth 2 (two) times daily with a meal. 08/11/22  Yes [name], MD  levocetirizine (XYZAL) 5 MG tablet Take 5 mg by mouth daily. 03/05/22  Yes [provider]  levothyroxine (SYNTHROID) 125 MCG tablet Take 1 tablet (125 mcg total) by mouth daily at 6 (six) AM. 12/07/20  Yes [name], DO  pantoprazole (PROTONIX) 40 MG tablet Take 40 mg by mouth daily. 12/24/19  Yes [provider]  pregabalin (LYRICA) 300 MG capsule Take 300 mg by mouth at bedtime.   Yes [provider]  prochlorperazine (COMPAZINE) 5 MG tablet Take 5 mg by mouth at bedtime.   Yes [provider]  rosuvastatin (CRESTOR) 40 MG tablet Take 40 mg by mouth daily. 08/09/21  Yes [provider]  sildenafil (REVATIO) 20 MG tablet Take 1 tablet (20 mg total) by mouth 3 (three) times daily. 05/11/22  Yes [name], [name], MD  sodium bicarbonate 650 MG tablet Take 650 mg by mouth 2 (two) times daily.   Yes [provider]  torsemide (DEMADEX) 20 MG tablet Take 40 mg by mouth daily.   Yes [provider]  tretinoin (RETIN-A) 0.05 % cream Apply 1 application topically at bedtime. 01/17/21  Yes [provider]  Insulin Pen Needle 32G X 4 MM MISC 1 Device by Does not apply route QID. For  use with insulin pens 02/13/22   [name], MD  spironolactone (ALDACTONE) 25 MG tablet Take 1 tablet (25 mg total) by mouth daily. 08/11/22 09/10/22  [name], MD    Physical Exam: BP (!) 179/107   Pulse (!) 105   Temp 98.1 F (36.7 C) (Oral)   Resp 18   LMP 08/25/2022 (Exact Date) Comment: negative I-stat beta HCG 09/15/22  SpO2 94%   General:  Alert, oriented, calm, in no acute distress, resting comfortably on a stretcher in the ER Eyes: EOMI, clear conjuctivae, white sclerea Neck: supple, no masses, trachea mildline  Cardiovascular: RRR, no murmurs or rubs, no peripheral edema  Respiratory: clear to auscultation bilaterally, no wheezes, no crackles  Abdomen: soft, vaguely diffuse tender, abdomen is slightly distended, normal bowel tones heard  Skin: dry, no rashes  Musculoskeletal: no joint effusions, normal range of motion  Psychiatric: appropriate affect, normal speech  Neurologic: extraocular muscles intact, clear speech, moving all extremities with intact sensorium          Labs on Admission:  Basic Metabolic Panel: Recent Labs  Lab 09/13/22 0256 09/14/22 2334 09/15/22 0105 09/19/22 0137 09/19/22 0655  NA 134* 128* 130* 118*  --   K 4.2 4.8 4.8 >7.5* 4.7  CL 100 90* 96* 91*  --   CO2 26 25  --  16*  --   GLUCOSE 188* 494* 480* 505*  --   BUN 40* 46* 44* 69*  --   CREATININE 1.93* 2.04* 2.10* 2.08*  --   CALCIUM 8.7* 10.4*  --  8.5*  --    Liver Function Tests: Recent Labs  Lab 09/14/22 2334  AST 56*  ALT 65*  ALKPHOS 104  BILITOT 0.8  PROT 8.6*  ALBUMIN 4.4   Recent Labs  Lab 09/14/22 2334 09/19/22 0137  LIPASE 28 39   No results for input(s): "AMMONIA" in the last 168 hours. CBC: Recent Labs  Lab 09/14/22 2334 09/15/22 0105 09/19/22 0137  WBC 6.5  --  6.5  NEUTROABS 2.9  --   --   HGB 11.3* 11.2* 13.4  HCT 35.5* 33.0* Unable to determine due to a cold agglutinin  MCV 89.2  --  Unable to determine due to a cold agglutinin  PLT  196  --  263   Cardiac Enzymes:  No results for input(s): "CKTOTAL", "CKMB", "CKMBINDEX", "TROPONINI" in the last 168 hours.  BNP (last 3 results) Recent Labs    06/05/22 0432 06/15/22 1105 06/22/22 1617  BNP 339.5* 80.9 70.1    ProBNP (last 3 results) No results for input(s): "PROBNP" in the last 8760 hours.  CBG: Recent Labs  Lab 09/18/22 2357 09/19/22 0112 09/19/22 0339 09/19/22 0451 09/19/22 0650  GLUCAP 487* 559* 417* 357* 222*    Radiological Exams on Admission: No results found.  Assessment/Plan This is a pleasant and chronically ill 32 year old female with a history of astrocytoma, CKD stage IV, chronic systolic congestive heart failure, insulin-dependent type 2 diabetes, fatty liver, hypertension, hypothyroidism, PCOS who was admitted 6/12 to 6/18 to [facility] for diabetic ketoacidosis, returns again today with hyperglycemia without DKA due to running out of her insulin on 6/21.  Hyperglycemia without obvious evidence of diabetic ketoacidosis, already improving with IV insulin and hydration. -Observation admission -Continue hydration -Follow-up blood sugars before every meal and at bedtime  Insulin-dependent type 2 diabetes, poorly controlled-last hemoglobin A1c 11.1 on 06/03/2022 -Carb controlled diet -Will resume home insulin regimen at reduced dose as the patient states she is not hungry, having vague abdominal discomfort (which is chronic for her) -Humulin R 100 units subcu 3 times daily with meals, titrate as indicated -Moderate dose sliding scale before every meal and at bedtime  Hyponatremia-sodium of 118 corrects to 124 -Continue NS infusion at 75 cc/h -Recheck BMP at noon  Poorly controlled hypertension, difficult to control -Continue aspirin, Coreg, hydralazine, Aldactone -IV hydralazine as needed SBP over 180 -Note patient has outpatient cardiology follow-up scheduled  Chronic systolic congestive heart failure, without evidence of acute  exacerbation -Continue the above cardiac medications -Watch closely for signs or symptoms of acute heart failure, will limit normal saline infusion to 12 hours  Peripheral neuropathy-continue Lyrica 300 p.o. nightly  CKD stage IV-renal function appears to be at baseline  Depression-continue Cymbalta 30 mg p.o. nightly  Pulmonary hypertension-continue Revatio 20 mg p.o. 3 times daily  Familial Hypertriglyceridemia-continue fenofibrate, Crestor  Hypothyroidism-Synthroid 125 mcg p.o. daily  Sonya-trazodone 25 mg p.o. nightly  DVT prophylaxis: Lovenox     Code Status: Full Code  Consults called: None  Admission status: Observation  Time spent: 59 minutes  Yamileth Hayse Sharlette Dense MD [facility] Pager [phone]  If 7PM-7AM, please contact night-coverage [email or web address] Password [facility]  09/19/2022, 8:53 AM

## 2022-09-19 NOTE — Inpatient Diabetes Management (Signed)
Inpatient Diabetes Program Recommendations  AACE/ADA: New Consensus Statement on Inpatient Glycemic Control (2015)  Target Ranges:  Prepandial:   less than 140 mg/dL      Peak postprandial:   less than 180 mg/dL (1-2 hours)      Critically ill patients:  140 - 180 mg/dL   Lab Results  Component Value Date   GLUCAP 420 (H) 09/19/2022   HGBA1C 11.1 (H) 06/03/2022    Review of Glycemic Control  Diabetes history: DM2 Outpatient Diabetes medications: U-500 120 TID Current orders for Inpatient glycemic control: U-500 100 units TID, Novolog 0-15 TID with meals and 0-5 HS  HgbA1C - 11.1 (06/03/22)  Inpatient Diabetes Program Recommendations:    Pt is very familiar to our team. Pt usually is not placed on her home U-500 insulin while inpatient due to hypoglycemia.   Consider Semglee 60 BID instead of U-500 100 units TID Novolog 4 units TID meal coverage if eating > 50%  Updated HgbA1C - pending  Follow.  Thank you. Ailene Ards, RD, LDN, CDCES Inpatient Diabetes Coordinator [phone]

## 2022-09-19 NOTE — ED Provider Notes (Signed)
[place] EMERGENCY DEPARTMENT AT [facility] Provider Note   CSN: [id number] Arrival date & time: 09/18/22  2346     History  Chief Complaint  Patient presents with   Hyperglycemia    Tere Baar is a 32 y.o. female with history of astrocytoma, CHF, lipoprotein deficiency, diabetes, hypertension, hypothyroidism, pancreatitis, PCOS who was recently admitted for DKA and discharged on 6/18.  She presents to the emerged from today for generalized vague abdominal pain nausea and reported hyperglycemia.  Has been out of her insulin x 2 days.  States that is post be refilled tomorrow.  Was seen for similar symptoms on 6/19.  I reviewed her medical records.  In addition to above listed history she history of CKD.  Elita Boone, A-fib, gastroparesis secondary to diabetes mellitus.  [name] insulin-dependent diabetic.  She has not been on any anticoagulation.  Also with history of reflux on pantoprazole.  HPI     Home Medications Prior to Admission medications   Medication Sig Start Date End Date Taking? Authorizing Provider  albuterol (VENTOLIN HFA) 108 (90 Base) MCG/ACT inhaler Inhale 1-2 puffs into the lungs every 6 (six) hours as needed for wheezing or shortness of breath. 12/31/18  Yes [provider]  aspirin EC 81 MG EC tablet Take 1 tablet (81 mg total) by mouth daily. Swallow whole. 12/07/20  Yes [name], DO  carvedilol (COREG) 6.25 MG tablet Take 1 tablet (6.25 mg total) by mouth 2 (two) times daily. 06/22/22  Yes Milford, Anderson Malta, FNP  CREON [phone] units CPEP capsule Take 36,000 Units by mouth 3 (three) times daily.   Yes [provider]  dicyclomine (BENTYL) 20 MG tablet Take 1 tablet (20 mg total) by mouth 2 (two) times daily. 09/15/22  Yes [name], [name], MD  DULoxetine (CYMBALTA) 30 MG capsule Take 30 mg by mouth at bedtime. 01/28/19 01/27/23 Yes [provider]  ergocalciferol (VITAMIN D2) 1.25 MG (50000 UT) capsule Take 50,000  Units by mouth every 7 (seven) days.   Yes [provider]  Evolocumab (REPATHA) 140 MG/ML SOSY Inject 140 mg into the skin See admin instructions. Inject 140 mg subcutaneously every other Monday evening   Yes [provider]  fenofibrate 160 MG tablet Take 1 tablet (160 mg total) by mouth daily. 12/07/20  Yes [name], DO  hydrALAZINE (APRESOLINE) 50 MG tablet Take 1 tablet (50 mg total) by mouth every 8 (eight) hours. 06/12/22 08/03/23 Yes Pahwani, Daleen Bo, MD  insulin regular human CONCENTRATED (HUMULIN R U-500 KWIKPEN) 500 UNIT/ML KwikPen Inject 120 Units into the skin 3 (three) times daily with meals. Patient taking differently: Inject 185 Units into the skin 3 (three) times daily with meals. 06/01/22  Yes Simonne Martinet, NP  ivabradine (CORLANOR) 7.5 MG TABS tablet Take 1 tablet (7.5 mg total) by mouth 2 (two) times daily with a meal. 08/11/22  Yes [name], MD  levocetirizine (XYZAL) 5 MG tablet Take 5 mg by mouth daily. 03/05/22  Yes [provider]  levothyroxine (SYNTHROID) 125 MCG tablet Take 1 tablet (125 mcg total) by mouth daily at 6 (six) AM. 12/07/20  Yes [name], DO  pantoprazole (PROTONIX) 40 MG tablet Take 40 mg by mouth daily. 12/24/19  Yes [provider]  pregabalin (LYRICA) 300 MG capsule Take 300 mg by mouth at bedtime.   Yes [provider]  prochlorperazine (COMPAZINE) 5 MG tablet Take 5 mg by mouth at bedtime.   Yes [provider]  rosuvastatin (CRESTOR) 40 MG tablet Take 40 mg by mouth daily. 08/09/21  Yes [provider]  sildenafil (REVATIO) 20 MG tablet Take 1 tablet (20 mg total) by mouth 3 (three) times daily. 05/11/22  Yes [name], [name], MD  sodium bicarbonate 650 MG tablet Take 650 mg by mouth 2 (two) times daily.   Yes [provider]  torsemide (DEMADEX) 20 MG tablet Take 40 mg by mouth daily.   Yes [provider]  tretinoin (RETIN-A) 0.05 % cream Apply 1  application topically at bedtime. 01/17/21  Yes [provider]  Insulin Pen Needle 32G X 4 MM MISC 1 Device by Does not apply route QID. For use with insulin pens 02/13/22   [name], MD  spironolactone (ALDACTONE) 25 MG tablet Take 1 tablet (25 mg total) by mouth daily. 08/11/22 09/10/22  [name], MD      Allergies    Ketamine, Erythromycin, Fentanyl, [place] mushroom [maitake], Penicillin v, Shellfish allergy, Morphine, Penicillins, and Prednisone    Review of Systems   Review of Systems  Constitutional: Negative.   HENT: Negative.    Respiratory: Negative.    Cardiovascular: Negative.   Gastrointestinal:  Positive for abdominal pain and nausea. Negative for diarrhea and vomiting.  Genitourinary: Negative.   Musculoskeletal: Negative.   Skin: Negative.   Neurological: Negative.     Physical Exam Updated Vital Signs BP (!) 177/109   Pulse (!) 107   Temp 98 F (36.7 C) (Oral)   Resp 18   LMP 08/25/2022 (Exact Date) Comment: negative I-stat beta HCG 09/15/22  SpO2 99%  Physical Exam Vitals and nursing note reviewed.  Constitutional:      Appearance: [name] not ill-appearing or toxic-appearing.  HENT:     Head: Normocephalic and atraumatic.     Mouth/Throat:     Mouth: Mucous membranes are moist.     Pharynx: No oropharyngeal exudate or posterior oropharyngeal erythema.  Eyes:     General:        Right eye: No discharge.        Left eye: No discharge.     Conjunctiva/sclera: Conjunctivae normal.  Cardiovascular:     Rate and Rhythm: Normal rate and regular rhythm.     Pulses: Normal pulses.     Heart sounds: Normal heart sounds. No murmur heard. Pulmonary:     Effort: Pulmonary effort is normal. No respiratory distress.     Breath sounds: Normal breath sounds. No wheezing or rales.  Chest:     Chest wall: No mass, lacerations, deformity, swelling, tenderness, crepitus or edema.  Abdominal:     General: Bowel sounds are normal. There is no  distension.     Palpations: Abdomen is soft.     Tenderness: There is generalized abdominal tenderness. There is no right CVA tenderness, left CVA tenderness, guarding or rebound.  Musculoskeletal:        General: No deformity.     Cervical back: Neck supple.  Skin:    General: Skin is warm and dry.  Neurological:     Mental Status: [name] alert. Mental status is at baseline.     GCS: GCS eye subscore is 4. GCS verbal subscore is 5. GCS motor subscore is 6.     Cranial Nerves: Cranial nerves 2-12 are intact.     Sensory: Sensation is intact.     Motor: Motor function is intact.     Gait: Gait is intact.  Psychiatric:  Mood and Affect: Mood normal.     ED Results / Procedures / Treatments   Labs (all labs ordered are listed, but only abnormal results are displayed) Labs Reviewed  BASIC METABOLIC PANEL - Abnormal; Notable for the following components:      Result Value   BUN 69 (*)    Creatinine, Ser 2.08 (*)    GFR, Estimated 32 (*)    All other components within normal limits  URINALYSIS, ROUTINE W REFLEX MICROSCOPIC - Abnormal; Notable for the following components:   Glucose, UA >=500 (*)    Hgb urine dipstick MODERATE (*)    Protein, ur 100 (*)    Bacteria, UA RARE (*)    All other components within normal limits  BLOOD GAS, VENOUS - Abnormal; Notable for the following components:   pCO2, Ven 40 (*)    pO2, Ven 52 (*)    Acid-base deficit 4.5 (*)    All other components within normal limits  CBG MONITORING, ED - Abnormal; Notable for the following components:   Glucose-Capillary 487 (*)    All other components within normal limits  CBG MONITORING, ED - Abnormal; Notable for the following components:   Glucose-Capillary 559 (*)    All other components within normal limits  CBG MONITORING, ED - Abnormal; Notable for the following components:   Glucose-Capillary 417 (*)    All other components within normal limits  CBG MONITORING, ED - Abnormal; Notable for the  following components:   Glucose-Capillary 357 (*)    All other components within normal limits  CBG MONITORING, ED - Abnormal; Notable for the following components:   Glucose-Capillary 222 (*)    All other components within normal limits  CBC  PREGNANCY, URINE  BETA-HYDROXYBUTYRIC ACID  LIPASE, BLOOD    EKG EKG Interpretation  Date/Time:  Monday September 19 2022 01:26:22 EDT Ventricular Rate:  88 PR Interval:  127 QRS Duration: 103 QT Interval:  392 QTC Calculation: 475 R Axis:   36 Text Interpretation: Sinus rhythm Confirmed by Drema Pry ([id number]) on 09/19/2022 5:45:37 AM  Radiology No results found.  Procedures .Critical Care  Performed by: Paris Lore, PA-C Authorized by: Paris Lore, PA-C   Critical care provider statement:    Critical care time (minutes):  45   Critical care was time spent personally by me on the following activities:  Development of treatment plan with patient or surrogate, discussions with consultants, evaluation of patient's response to treatment, examination of patient, obtaining history from patient or surrogate, ordering and performing treatments and interventions, ordering and review of laboratory studies, ordering and review of radiographic studies, pulse oximetry and re-evaluation of patient's condition     Medications Ordered in ED Medications  famotidine (PEPCID) IVPB 20 mg premix (has no administration in time range)  lactated ringers bolus 1,180 mL (0 mLs Intravenous Stopped 09/19/22 0340)  metoCLOPramide (REGLAN) injection 10 mg (10 mg Intravenous Given 09/19/22 0233)  insulin aspart (novoLOG) injection 10 Units (10 Units Subcutaneous Given 09/19/22 0345)  lactated ringers bolus 1,000 mL (0 mLs Intravenous Stopped 09/19/22 0540)  dicyclomine (BENTYL) capsule 10 mg (10 mg Oral Given 09/19/22 0530)  sodium chloride 0.9 % bolus 1,000 mL (1,000 mLs Intravenous New Bag/Given 09/19/22 0549)    ED Course/ Medical Decision  Making/ A&P Clinical Course as of 09/19/22 0655  Mon Sep 19, 2022  0532 Critical labs, Na 118, glucose 505, osmol 344. Correct Na for hyperglycemia ~124. Will initiate NS at 400 cc/hr for sodium  correction. LR discontinued.  [RS]    Clinical Course User Index [RS] Calen Posch, Eugene Gavia, PA-C                             Medical Decision Making 32 year old female presents with nausea, generalized abdominal pain, and concern for hyperglycemia in context of lack of insulin.  Hypertensive on the, vitals otherwise normal.  Cardiopulmonary exam is unremarkable, abdominal exam is significant for generalized abdominal tenderness palpation.   Concern for hyperglycemic crisis  Amount and/or Complexity of Data Reviewed Labs: ordered.    Details: CBG 559, UA with moderate hemoglobin, large glucose or bacteria.  VBG without acidosis.  Risk Prescription drug management. Decision regarding hospitalization.   Patient will require admission to the hospital for hyperglycemic crisis as well as hyponatremia.  Unfortunately due to patient's lipoprotein disorder, extensive delay and receiving metabolic labs.  Patient initiated on NS 500 cc/h for sodium correction, has received subcu insulin at this time.  Resting comfortably in her bed.   Care of this patient signed out to oncoming ED provider [facility] at time of shift change. Plan for admission following completion of labs. All pertinent HPI, physical exam, and laboratory findings were discussed with them prior to my departure. Disposition of patient pending completion of workup, reevaluation, and clinical judgement of oncoming ED provider.   This chart was dictated using voice recognition software, Dragon. Despite the best efforts of this provider to proofread and correct errors, errors may still occur which can change documentation meaning.    Final Clinical Impression(s) / ED Diagnoses Final diagnoses:  Hyperglycemia  Hyponatremia    Rx  / DC Orders ED Discharge Orders     None         Sherrilee Gilles 09/19/22 [id number]    Nira Conn, MD 09/20/22 [phone]

## 2022-09-19 NOTE — ED Notes (Signed)
Lab called with critical K+, recommended to send over another sample to [facility]. PA aware

## 2022-09-19 NOTE — ED Notes (Signed)
Assumed care of patient. Patient is resting comfortably in bed with no signs of acute distress noted. Waiting on admission orders and ready hospital bed.

## 2022-09-19 NOTE — ED Notes (Signed)
ED TO INPATIENT HANDOFF REPORT  Name/Age/Gender Jennifer [name] 32 y.o. female  Code Status    Code Status Orders  (From admission, onward)           Start     Ordered   09/19/22 0843  Full code  Continuous       Question:  By:  Answer:  Consent: discussion documented in EHR   09/19/22 0844           Code Status History     Date Active Date Inactive Code Status Order ID Comments User Context   09/07/2022 0818 09/13/2022 1546 Full Code [id number]  Bobette Mo, MD ED   06/15/2022 1500 06/16/2022 2056 Full Code [id number]  Emeline General, MD ED   05/29/2022 1401 06/13/2022 0058 Full Code [id number]  Lorin Glass, MD Inpatient   03/23/2022 1603 03/25/2022 1849 Full Code [id number]  Alba Cory, MD Inpatient   03/14/2022 2224 03/18/2022 1617 Full Code [id number]  Briscoe Deutscher, MD Inpatient   02/06/2022 2021 02/14/2022 0058 Full Code [id number]  Alberteen Sam, MD Inpatient   12/02/2021 1851 12/12/2021 2137 Full Code [id number]  Clydie Braun, MD Inpatient   08/26/2021 2251 09/02/2021 2012 Full Code [id number]  Anselm Jungling, DO Inpatient   08/14/2021 0318 08/17/2021 1739 Full Code [id number]  Briscoe Deutscher, MD Inpatient   08/05/2021 1021 08/05/2021 1907 Full Code [id number]  Dolores Patty, MD Inpatient   03/10/2021 1125 03/11/2021 1909 Full Code [id number]  Burnadette Pop, MD Inpatient   01/25/2021 0117 01/27/2021 2218 Full Code [id number]  Charlsie Quest, MD ED   11/23/2020 0354 12/06/2020 1645 Full Code [id number]  Chotiner, Claudean Severance, MD ED       Home/SNF/Other Home  Chief Complaint Hyperglycemia [R73.9]  Level of Care/Admitting Diagnosis ED Disposition     ED Disposition  Admit   Condition  --   Comment  Hospital Area: [facility] [[id number]]  Level of Care: Med-Surg [16]  May place patient in observation at [facility] or [facility] if equivalent level of care is available:: Yes  Covid Evaluation: Asymptomatic - no recent exposure  (last 10 days) testing not required  Diagnosis: Hyperglycemia [762831]  Admitting Physician: Maryln Gottron [[id number]]  Attending Physician: [facility], MIR [email or web address] [[id number]]          Medical History Past Medical History:  Diagnosis Date   Afib (HCC) 05/12/2021   Brain tumor (HCC) 03/29/1995   astrocytoma   CHF (congestive heart failure) (HCC)    Cholesterosis    CKD (chronic kidney disease) stage 4, GFR 15-29 ml/min (HCC) 05/13/2021   DM (diabetes mellitus) (HCC) 10/10/2018   Fatty liver    HTN (hypertension) 10/10/2018   Hypothyroidism 10/10/2018   Lipoprotein deficiency    Lung disease    longevity long term   Pancreatitis    Polycystic ovary syndrome     Allergies Allergies  Allergen Reactions   Ketamine Other (See Comments)    In vegetative state for 15 minutes per pt   Erythromycin     unk   Fentanyl Nausea And Vomiting   Maitake Mushroom [Maitake] Itching    Itchy throat   Penicillin V Itching    Gi upset   Shellfish Allergy Other (See Comments)    Pt has never had shellfish but tested positive on allergy test. Pt states contrast in CT is okay   Morphine Itching and Rash   Penicillins Rash    Has  patient had a PCN reaction causing immediate rash, facial/tongue/throat swelling, SOB or lightheadedness with hypotension: Y Has patient had a PCN reaction causing severe rash involving mucus membranes or skin necrosis: Y Has patient had a PCN reaction that required hospitalization: N Has patient had a PCN reaction occurring within the last 10 years: Y If all of the above answers are "NO", then may proceed with Cephalosporin use.    Prednisone Rash    IV Location/Drains/Wounds Patient Lines/Drains/Airways Status     Active Line/Drains/Airways     Name Placement date Placement time Site Days   Peripheral IV 09/19/22 20 G 1" Posterior;Right Forearm 09/19/22  0138  Forearm  less than 1            Labs/Imaging Results for orders placed or performed during  the hospital encounter of 09/18/22 (from the past 48 hour(s))  Urinalysis, Routine w reflex microscopic -Urine, Clean Catch     Status: Abnormal   Collection Time: 09/18/22  1:02 AM  Result Value Ref Range   Color, Urine YELLOW YELLOW   APPearance CLEAR CLEAR   Specific Gravity, Urine 1.016 1.005 - 1.030   pH 5.0 5.0 - 8.0   Glucose, UA >=500 (A) NEGATIVE mg/dL   Hgb urine dipstick MODERATE (A) NEGATIVE   Bilirubin Urine NEGATIVE NEGATIVE   Ketones, ur NEGATIVE NEGATIVE mg/dL   Protein, ur 086 (A) NEGATIVE mg/dL   Nitrite NEGATIVE NEGATIVE   Leukocytes,Ua NEGATIVE NEGATIVE   RBC / HPF 0-5 0 - 5 RBC/hpf   WBC, UA 6-10 0 - 5 WBC/hpf   Bacteria, UA RARE (A) NONE SEEN   Squamous Epithelial / HPF 0-5 0 - 5 /HPF    Comment: Performed at [facility], [address], [place], [place] [address]  CBG monitoring, ED     Status: Abnormal   Collection Time: 09/18/22 11:57 PM  Result Value Ref Range   Glucose-Capillary 487 (H) 70 - 99 mg/dL    Comment: Glucose reference range applies only to samples taken after fasting for at least 8 hours.  CBG monitoring, ED     Status: Abnormal   Collection Time: 09/19/22  1:12 AM  Result Value Ref Range   Glucose-Capillary 559 (HH) 70 - 99 mg/dL    Comment: Glucose reference range applies only to samples taken after fasting for at least 8 hours.   Comment 1 Notify RN   Basic metabolic panel     Status: Abnormal   Collection Time: 09/19/22  1:37 AM  Result Value Ref Range   Sodium 118 (LL) 135 - 145 mmol/L    Comment: CRITICAL RESULT CALLED TO, READ BACK BY AND VERIFIED WITH HUGHES,HANNAH RN @0515  09/19/22 SATRAINR   Potassium >7.5 (HH) 3.5 - 5.1 mmol/L    Comment: HEMOLYSIS AT THIS LEVEL MAY AFFECT RESULT CRITICAL RESULT CALLED TO, READ BACK BY AND VERIFIED WITH HUGHES,HANNAH RN @0649  09/19/22 SATRAINR    Chloride 91 (L) 98 - 111 mmol/L   CO2 16 (L) 22 - 32 mmol/L   Glucose, Bld 505 (HH) 70 - 99 mg/dL    Comment: CRITICAL RESULT  CALLED TO, READ BACK BY AND VERIFIED WITH HUGHES,HANNAH RN @0515  09/19/22 SATRAINR Glucose reference range applies only to samples taken after fasting for at least 8 hours.    BUN 69 (H) 6 - 20 mg/dL   Creatinine, Ser 9.62 (H) 0.44 - 1.00 mg/dL   Calcium 8.5 (L) 8.9 - 10.3 mg/dL   GFR, Estimated 32 (L) >60 mL/min  Comment: (NOTE) Calculated using the CKD-EPI Creatinine Equation (2021)    Anion gap 11 5 - 15    Comment: Performed at [facility], [address], [place], [place] [address]  CBC     Status: None   Collection Time: 09/19/22  1:37 AM  Result Value Ref Range   WBC 6.5 4.0 - 10.5 K/uL   RBC Unable to determine due to a cold agglutinin 3.87 - 5.11 MIL/uL   Hemoglobin 13.4 12.0 - 15.0 g/dL   HCT Unable to determine due to a cold agglutinin 36.0 - 46.0 %   MCV Unable to determine due to a cold agglutinin 80.0 - 100.0 fL   MCH Unable to determine due to a cold agglutinin 26.0 - 34.0 pg   MCHC Unable to determine due to a cold agglutinin 30.0 - 36.0 g/dL   RDW Unable to determine due to a cold agglutinin 11.5 - 15.5 %   Platelets 263 150 - 400 K/uL   nRBC 0.0 0.0 - 0.2 %    Comment: Performed at [facility], [address], [place], [place] [address]  Beta-hydroxybutyric acid     Status: Abnormal   Collection Time: 09/19/22  1:37 AM  Result Value Ref Range   Beta-Hydroxybutyric Acid 0.86 (H) 0.05 - 0.27 mmol/L    Comment: POST-ULTRACENTRIFUGATION Performed at [facility], [address], [place], [place] [address]   Blood gas, venous     Status: Abnormal   Collection Time: 09/19/22  1:37 AM  Result Value Ref Range   pH, Ven 7.33 7.25 - 7.43   pCO2, Ven 40 (L) 44 - 60 mmHg   pO2, Ven 52 (H) 32 - 45 mmHg   Bicarbonate 21.1 20.0 - 28.0 mmol/L   Acid-base deficit 4.5 (H) 0.0 - 2.0 mmol/L   O2 Saturation RESULTS UNAVAILABLE DUE TO INTERFERING SUBSTANCE %   Patient temperature 37.0     Comment: Performed at [facility],  [address], [place], [place] [address]  Lipase, blood     Status: None   Collection Time: 09/19/22  1:37 AM  Result Value Ref Range   Lipase 39 11 - 51 U/L    Comment: POST-ULTRACENTRIFUGATION Performed at [facility], [address], [place], [place] [address]   Pregnancy, urine     Status: None   Collection Time: 09/19/22  2:49 AM  Result Value Ref Range   Preg Test, Ur NEGATIVE NEGATIVE    Comment:        THE SENSITIVITY OF THIS METHODOLOGY IS >25 mIU/mL. Performed at [facility], [address], [place], [place] [address]   CBG monitoring, ED     Status: Abnormal   Collection Time: 09/19/22  3:39 AM  Result Value Ref Range   Glucose-Capillary 417 (H) 70 - 99 mg/dL    Comment: Glucose reference range applies only to samples taken after fasting for at least 8 hours.  CBG monitoring, ED     Status: Abnormal   Collection Time: 09/19/22  4:51 AM  Result Value Ref Range   Glucose-Capillary 357 (H) 70 - 99 mg/dL    Comment: Glucose reference range applies only to samples taken after fasting for at least 8 hours.  CBG monitoring, ED     Status: Abnormal   Collection Time: 09/19/22  6:50 AM  Result Value Ref Range   Glucose-Capillary 222 (H) 70 - 99 mg/dL    Comment: Glucose  reference range applies only to samples taken after fasting for at least 8 hours.  Potassium     Status: None   Collection Time: 09/19/22  6:55 AM  Result Value Ref Range   Potassium 4.7 3.5 - 5.1 mmol/L    Comment: SLIGHT HEMOLYSIS LIPEMIC SPECIMEN Performed at [facility], [address], [place], [place] [address]    No results found.  Pending Labs Unresulted Labs (From admission, onward)     Start     Ordered   09/20/22 0500  Basic metabolic panel  Tomorrow morning,   R        09/19/22 0844   09/20/22 0500  CBC  Tomorrow morning,   R        09/19/22 0844   09/19/22 1200  Basic metabolic panel  Once,   R        09/19/22 0855             Vitals/Pain Today's Vitals   09/19/22 0945 09/19/22 0950 09/19/22 1015 09/19/22 1030  BP: (!) 191/107 (!) 191/107  135/86  Pulse: (!) 101  (!) 127   Resp:      Temp:      TempSrc:      SpO2: 93%  96%   PainSc:        Isolation Precautions No active isolations  Medications Medications  aspirin EC tablet 81 mg (81 mg Oral Given 09/19/22 0949)  carvedilol (COREG) tablet 6.25 mg (6.25 mg Oral Given 09/19/22 0949)  fenofibrate tablet 160 mg (has no administration in time range)  hydrALAZINE (APRESOLINE) tablet 50 mg (50 mg Oral Given 09/19/22 0950)  ivabradine (CORLANOR) tablet 7.5 mg (has no administration in time range)  rosuvastatin (CRESTOR) tablet 40 mg (40 mg Oral Given 09/19/22 0950)  sildenafil (REVATIO) tablet 20 mg (has no administration in time range)  spironolactone (ALDACTONE) tablet 25 mg (has no administration in time range)  DULoxetine (CYMBALTA) DR capsule 30 mg (has no administration in time range)  prochlorperazine (COMPAZINE) tablet 5 mg (has no administration in time range)  levothyroxine (SYNTHROID) tablet 125 mcg (has no administration in time range)  lipase/protease/amylase (CREON) capsule 36,000 Units (has no administration in time range)  pantoprazole (PROTONIX) EC tablet 40 mg (40 mg Oral Given 09/19/22 0950)  sodium bicarbonate tablet 650 mg (has no administration in time range)  pregabalin (LYRICA) capsule 300 mg (has no administration in time range)  loratadine (CLARITIN) tablet 10 mg (10 mg Oral Given 09/19/22 0949)  insulin regular human CONCENTRATED (HUMULIN R) 500 UNIT/ML KwikPen 100 Units (has no administration in time range)  enoxaparin (LOVENOX) injection 30 mg (30 mg Subcutaneous Given 09/19/22 0950)  insulin aspart (novoLOG) injection 0-15 Units (has no administration in time range)  insulin aspart (novoLOG) injection 0-5 Units (has no administration in time range)  0.9 %  sodium chloride infusion ( Intravenous New Bag/Given 09/19/22 0955)   acetaminophen (TYLENOL) tablet 650 mg (has no administration in time range)    Or  acetaminophen (TYLENOL) suppository 650 mg (has no administration in time range)  traZODone (DESYREL) tablet 25 mg (has no administration in time range)  lactated ringers bolus 1,180 mL (0 mLs Intravenous Stopped 09/19/22 0340)  metoCLOPramide (REGLAN) injection 10 mg (10 mg Intravenous Given 09/19/22 0233)  insulin aspart (novoLOG) injection 10 Units (10 Units Subcutaneous Given 09/19/22 0345)  lactated ringers bolus 1,000 mL (0 mLs Intravenous Stopped 09/19/22 0540)  dicyclomine (BENTYL) capsule 10 mg (10 mg Oral Given 09/19/22 0530)  sodium chloride 0.9 % bolus 1,000 mL (0 mLs Intravenous Stopped 09/19/22 0829)  famotidine (PEPCID) IVPB 20 mg premix (0 mg Intravenous Stopped 09/19/22 0747)  HYDROmorphone (DILAUDID) injection 0.5 mg (0.5 mg Intravenous Given 09/19/22 0747)    Mobility walks

## 2022-09-19 NOTE — ED Provider Notes (Signed)
Received handoff from [place], [place], plan is to admit for hypoglycemia, hyponatremia. Hx of DKA. Has not had insulin since then being admitted last week on 6/18. Hx of lipoprotein disorder. C/o abdominal pain, elevated glucose.   Physical Exam  BP (!) 169/135   Pulse (!) 107   Temp 98 F (36.7 C) (Oral)   Resp 16   LMP 08/25/2022 (Exact Date) Comment: negative I-stat beta HCG 09/15/22  SpO2 99%   Physical Exam Vitals and nursing note reviewed.  Constitutional:      General: [name] not in acute distress.    Appearance: [name] well-developed.  HENT:     Head: Normocephalic and atraumatic.  Eyes:     Conjunctiva/sclera: Conjunctivae normal.  Cardiovascular:     Rate and Rhythm: Normal rate and regular rhythm.     Heart sounds: No murmur heard. Pulmonary:     Effort: Pulmonary effort is normal. No respiratory distress.     Breath sounds: Normal breath sounds.  Abdominal:     Palpations: Abdomen is soft.     Tenderness: There is generalized abdominal tenderness.  Musculoskeletal:        General: No swelling.     Cervical back: Neck supple.  Skin:    General: Skin is warm and dry.     Capillary Refill: Capillary refill takes less than 2 seconds.  Neurological:     Mental Status: [name] alert.  Psychiatric:        Mood and Affect: Mood normal.     Procedures  Procedures  ED Course / MDM   Clinical Course as of 09/19/22 0636  Mon Sep 19, 2022  0532 Critical labs, Na 118, glucose 505, osmol 344. Correct Na for hyperglycemia ~124. Will initiate NS at 400 cc/hr for sodium correction. LR discontinued.  [RS]    Clinical Course User Index [RS] Sponseller, Eugene Gavia, PA-C   Medical Decision Making Patient is a 32 year old female, here for hyperglycemia, hyponatremia.  Uncorrected sodium is 124.  Initial potassium was 7.5+, repeat was 4.7.  Patient given 10 units of insulin, 3 L of IV fluids, hyperglycemia downtrending however given that she was recently admitted for DKA, is  hyperglycemic again, and has been without insulin for some time, we will admit her for hyperglycemia, and hyponatremia.  She will require further IV fluids, insulin, and further management.  As well as determining etiology of hyponatremia.  Discussed with Dr. Erenest Blank, he accepts admission of patient.  Amount and/or Complexity of Data Reviewed Labs: ordered.  Risk Prescription drug management. Decision regarding hospitalization.         Pete Pelt, [place] 09/19/22 1610    Margarita Grizzle, MD 09/21/22 [phone]

## 2022-09-20 ENCOUNTER — Other Ambulatory Visit (HOSPITAL_COMMUNITY): Payer: Self-pay

## 2022-09-20 ENCOUNTER — Other Ambulatory Visit: Payer: Self-pay

## 2022-09-20 DIAGNOSIS — R739 Hyperglycemia, unspecified: Secondary | ICD-10-CM | POA: Diagnosis not present

## 2022-09-20 LAB — CBC
HCT: 28.6 % — ABNORMAL LOW (ref 36.0–46.0)
Hemoglobin: 10.4 g/dL — ABNORMAL LOW (ref 12.0–15.0)
MCH: 32.7 pg (ref 26.0–34.0)
MCHC: 36.4 g/dL — ABNORMAL HIGH (ref 30.0–36.0)
MCV: 89.9 fL (ref 80.0–100.0)
Platelets: 185 10*3/uL (ref 150–400)
RBC: 3.18 MIL/uL — ABNORMAL LOW (ref 3.87–5.11)
RDW: 14.6 % (ref 11.5–15.5)
WBC: 5 10*3/uL (ref 4.0–10.5)
nRBC: 0 % (ref 0.0–0.2)

## 2022-09-20 LAB — BASIC METABOLIC PANEL
Anion gap: 15 (ref 5–15)
BUN: 43 mg/dL — ABNORMAL HIGH (ref 6–20)
CO2: 15 mmol/L — ABNORMAL LOW (ref 22–32)
Calcium: 8.7 mg/dL — ABNORMAL LOW (ref 8.9–10.3)
Chloride: 106 mmol/L (ref 98–111)
Creatinine, Ser: 1.61 mg/dL — ABNORMAL HIGH (ref 0.44–1.00)
GFR, Estimated: 44 mL/min — ABNORMAL LOW (ref >60)
Glucose, Bld: 220 mg/dL — ABNORMAL HIGH (ref 70–99)
Potassium: 5 mmol/L (ref 3.5–5.1)
Sodium: 136 mmol/L (ref 135–145)

## 2022-09-20 LAB — GLUCOSE, CAPILLARY: Glucose-Capillary: 167 mg/dL — ABNORMAL HIGH (ref 70–99)

## 2022-09-20 MED ORDER — HUMULIN R U-500 KWIKPEN 500 UNIT/ML ~~LOC~~ SOPN
185.0000 [IU] | PEN_INJECTOR | Freq: Three times a day (TID) | SUBCUTANEOUS | 0 refills | Status: DC
  Filled 2022-09-20: qty 20, 18d supply, fill #0
  Filled 2023-08-26: qty 33, 30d supply, fill #0
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
## 2022-09-22 ENCOUNTER — Telehealth (HOSPITAL_COMMUNITY): Payer: Self-pay | Admitting: Licensed Clinical Social Worker
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
## 2022-09-26 DIAGNOSIS — I152 Hypertension secondary to endocrine disorders: Secondary | ICD-10-CM | POA: Diagnosis not present

## 2022-09-26 DIAGNOSIS — Z7689 Persons encountering health services in other specified circumstances: Secondary | ICD-10-CM | POA: Diagnosis not present

## 2022-09-26 DIAGNOSIS — Z91148 Patient's other noncompliance with medication regimen for other reason: Secondary | ICD-10-CM | POA: Diagnosis not present

## 2022-09-26 DIAGNOSIS — M79672 Pain in left foot: Secondary | ICD-10-CM | POA: Diagnosis not present

## 2022-09-26 DIAGNOSIS — F419 Anxiety disorder, unspecified: Secondary | ICD-10-CM | POA: Diagnosis not present

## 2022-09-26 DIAGNOSIS — S060X0A Concussion without loss of consciousness, initial encounter: Secondary | ICD-10-CM | POA: Diagnosis not present

## 2022-09-26 DIAGNOSIS — M79671 Pain in right foot: Secondary | ICD-10-CM | POA: Diagnosis not present

## 2022-09-26 DIAGNOSIS — E1159 Type 2 diabetes mellitus with other circulatory complications: Secondary | ICD-10-CM | POA: Diagnosis not present

## 2022-09-26 DIAGNOSIS — Z5982 Transportation insecurity: Secondary | ICD-10-CM | POA: Diagnosis not present

## 2022-09-26 DIAGNOSIS — R519 Headache, unspecified: Secondary | ICD-10-CM | POA: Diagnosis not present

## 2022-09-26 DIAGNOSIS — Z419 Encounter for procedure for purposes other than remedying health state, unspecified: Secondary | ICD-10-CM | POA: Diagnosis not present

## 2022-09-26 DIAGNOSIS — J320 Chronic maxillary sinusitis: Secondary | ICD-10-CM | POA: Diagnosis not present

## 2022-09-27 DIAGNOSIS — H34832 Tributary (branch) retinal vein occlusion, left eye, with macular edema: Secondary | ICD-10-CM | POA: Diagnosis not present

## 2022-09-27 DIAGNOSIS — H53141 Visual discomfort, right eye: Secondary | ICD-10-CM | POA: Diagnosis not present

## 2022-09-27 DIAGNOSIS — H5319 Other subjective visual disturbances: Secondary | ICD-10-CM | POA: Diagnosis not present

## 2022-09-27 DIAGNOSIS — Z7689 Persons encountering health services in other specified circumstances: Secondary | ICD-10-CM | POA: Diagnosis not present

## 2022-09-27 DIAGNOSIS — S0511XA Contusion of eyeball and orbital tissues, right eye, initial encounter: Secondary | ICD-10-CM | POA: Diagnosis not present

## 2022-09-28 ENCOUNTER — Telehealth (HOSPITAL_COMMUNITY): Payer: Self-pay | Admitting: *Deleted

## 2022-09-28 ENCOUNTER — Telehealth (HOSPITAL_COMMUNITY): Payer: Self-pay

## 2022-09-28 DIAGNOSIS — K760 Fatty (change of) liver, not elsewhere classified: Secondary | ICD-10-CM | POA: Diagnosis not present

## 2022-09-28 DIAGNOSIS — K8689 Other specified diseases of pancreas: Secondary | ICD-10-CM | POA: Diagnosis not present

## 2022-09-28 DIAGNOSIS — K3184 Gastroparesis: Secondary | ICD-10-CM | POA: Diagnosis not present

## 2022-09-28 DIAGNOSIS — K862 Cyst of pancreas: Secondary | ICD-10-CM | POA: Diagnosis not present

## 2022-09-28 DIAGNOSIS — E1143 Type 2 diabetes mellitus with diabetic autonomic (poly)neuropathy: Secondary | ICD-10-CM | POA: Diagnosis not present

## 2022-09-28 NOTE — Telephone Encounter (Signed)
I reached out to pt to see if she is avail for home visit for wed am.  She never responded to set that appointment.  Will f/u next week.   Kerry Hough, EMT-Paramedic  [phone] 09/27/22

## 2022-10-04 ENCOUNTER — Ambulatory Visit (HOSPITAL_BASED_OUTPATIENT_CLINIC_OR_DEPARTMENT_OTHER)
Admission: RE | Admit: 2022-10-04 | Discharge: 2022-10-04 | Disposition: A | Discharge status: Home / Self Care | Payer: Medicaid Other | Source: Ambulatory Visit | Attending: Internal Medicine | Admitting: Internal Medicine

## 2022-10-04 ENCOUNTER — Other Ambulatory Visit (HOSPITAL_COMMUNITY): Payer: Self-pay

## 2022-10-04 ENCOUNTER — Encounter (HOSPITAL_COMMUNITY): Payer: Self-pay | Admitting: Internal Medicine

## 2022-10-04 ENCOUNTER — Ambulatory Visit (HOSPITAL_COMMUNITY)
Admission: RE | Admit: 2022-10-04 | Discharge: 2022-10-04 | Disposition: A | Discharge status: Home / Self Care | Payer: Medicaid Other | Source: Ambulatory Visit | Attending: Internal Medicine | Admitting: Internal Medicine

## 2022-10-04 VITALS — BP 112/72 | HR 85 | Wt 132.0 lb

## 2022-10-04 DIAGNOSIS — B9620 Unspecified Escherichia coli [E. coli] as the cause of diseases classified elsewhere: Secondary | ICD-10-CM

## 2022-10-04 DIAGNOSIS — I130 Hypertensive heart and chronic kidney disease with heart failure and stage 1 through stage 4 chronic kidney disease, or unspecified chronic kidney disease: Secondary | ICD-10-CM | POA: Insufficient documentation

## 2022-10-04 DIAGNOSIS — E1122 Type 2 diabetes mellitus with diabetic chronic kidney disease: Secondary | ICD-10-CM | POA: Diagnosis not present

## 2022-10-04 DIAGNOSIS — N390 Urinary tract infection, site not specified: Secondary | ICD-10-CM | POA: Insufficient documentation

## 2022-10-04 DIAGNOSIS — I272 Pulmonary hypertension, unspecified: Secondary | ICD-10-CM | POA: Insufficient documentation

## 2022-10-04 DIAGNOSIS — I5022 Chronic systolic (congestive) heart failure: Secondary | ICD-10-CM

## 2022-10-04 DIAGNOSIS — N184 Chronic kidney disease, stage 4 (severe): Secondary | ICD-10-CM

## 2022-10-04 DIAGNOSIS — Z8349 Family history of other endocrine, nutritional and metabolic diseases: Secondary | ICD-10-CM | POA: Insufficient documentation

## 2022-10-04 DIAGNOSIS — Z79899 Other long term (current) drug therapy: Secondary | ICD-10-CM | POA: Insufficient documentation

## 2022-10-04 DIAGNOSIS — E781 Pure hyperglyceridemia: Secondary | ICD-10-CM | POA: Diagnosis not present

## 2022-10-04 DIAGNOSIS — R140 Abdominal distension (gaseous): Secondary | ICD-10-CM | POA: Diagnosis not present

## 2022-10-04 DIAGNOSIS — I509 Heart failure, unspecified: Secondary | ICD-10-CM

## 2022-10-04 DIAGNOSIS — K861 Other chronic pancreatitis: Secondary | ICD-10-CM | POA: Insufficient documentation

## 2022-10-04 DIAGNOSIS — I428 Other cardiomyopathies: Secondary | ICD-10-CM | POA: Insufficient documentation

## 2022-10-04 DIAGNOSIS — Z7689 Persons encountering health services in other specified circumstances: Secondary | ICD-10-CM | POA: Diagnosis not present

## 2022-10-04 DIAGNOSIS — I2510 Atherosclerotic heart disease of native coronary artery without angina pectoris: Secondary | ICD-10-CM | POA: Insufficient documentation

## 2022-10-04 DIAGNOSIS — Z5982 Transportation insecurity: Secondary | ICD-10-CM | POA: Diagnosis not present

## 2022-10-04 DIAGNOSIS — Z833 Family history of diabetes mellitus: Secondary | ICD-10-CM | POA: Insufficient documentation

## 2022-10-04 DIAGNOSIS — Z5986 Financial insecurity: Secondary | ICD-10-CM | POA: Diagnosis not present

## 2022-10-04 DIAGNOSIS — I083 Combined rheumatic disorders of mitral, aortic and tricuspid valves: Secondary | ICD-10-CM | POA: Diagnosis not present

## 2022-10-04 DIAGNOSIS — R7989 Other specified abnormal findings of blood chemistry: Secondary | ICD-10-CM | POA: Diagnosis not present

## 2022-10-04 DIAGNOSIS — Z8249 Family history of ischemic heart disease and other diseases of the circulatory system: Secondary | ICD-10-CM | POA: Diagnosis not present

## 2022-10-04 LAB — ECHOCARDIOGRAM COMPLETE
Area-P 1/2: 5.97 cm2
Calc EF: 53.4 %
P 1/2 time: 465 msec
S' Lateral: 3 cm
Single Plane A2C EF: 53.9 %
Single Plane A4C EF: 54.5 %

## 2022-10-04 LAB — COMPREHENSIVE METABOLIC PANEL
ALT: 48 U/L — ABNORMAL HIGH (ref 0–44)
AST: 50 U/L — ABNORMAL HIGH (ref 15–41)
Albumin: 3.9 g/dL (ref 3.5–5.0)
Alkaline Phosphatase: 55 U/L (ref 38–126)
Anion gap: 13 (ref 5–15)
BUN: 77 mg/dL — ABNORMAL HIGH (ref 6–20)
CO2: 23 mmol/L (ref 22–32)
Calcium: 9.2 mg/dL (ref 8.9–10.3)
Chloride: 99 mmol/L (ref 98–111)
Creatinine, Ser: 2.27 mg/dL — ABNORMAL HIGH (ref 0.44–1.00)
GFR, Estimated: 29 mL/min — ABNORMAL LOW (ref >60)
Glucose, Bld: 241 mg/dL — ABNORMAL HIGH (ref 70–99)
Potassium: 4.3 mmol/L (ref 3.5–5.1)
Sodium: 135 mmol/L (ref 135–145)
Total Bilirubin: 0.7 mg/dL (ref 0.3–1.2)
Total Protein: 7.7 g/dL (ref 6.5–8.1)

## 2022-10-04 LAB — LIPID PANEL
Cholesterol: 333 mg/dL — ABNORMAL HIGH (ref 0–200)
LDL Cholesterol: UNDETERMINED mg/dL (ref 0–99)
Triglycerides: 1703 mg/dL — ABNORMAL HIGH (ref <150)
VLDL: UNDETERMINED mg/dL (ref 0–40)

## 2022-10-04 LAB — LDL CHOLESTEROL, DIRECT: Direct LDL: 44 mg/dL (ref 0–99)

## 2022-10-04 LAB — BRAIN NATRIURETIC PEPTIDE: B Natriuretic Peptide: 18.9 pg/mL (ref 0.0–100.0)

## 2022-10-04 MED ORDER — CARVEDILOL 3.125 MG PO TABS: 3.1250 mg | ORAL_TABLET | Freq: Two times a day (BID) | ORAL | 11 refills | Status: DC

## 2022-10-04 NOTE — Progress Notes (Signed)
Echocardiogram 2D Echocardiogram has been performed.  Jennifer [name] 10/04/2022, 11:10 AM
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
## 2022-10-04 NOTE — Patient Instructions (Signed)
STOP Hydralazine.  DECREASE Carvedilol to 3.125 mg Twice daily  Labs done today, your results will be available in MyChart, we will contact you for abnormal readings.  Your physician recommends that you schedule a follow-up appointment in: 4 months   If you have any questions or concerns before your next appointment please send [place] a message through [place] or call our office at [phone].    TO LEAVE A MESSAGE FOR THE NURSE SELECT OPTION 2, PLEASE LEAVE A MESSAGE INCLUDING: YOUR NAME DATE OF BIRTH CALL BACK NUMBER REASON FOR CALL**this is important as we prioritize the call backs  YOU WILL RECEIVE A CALL BACK THE SAME DAY AS LONG AS YOU CALL BEFORE 4:00 PM  At the [facility], you and your health needs are our priority. As part of our continuing mission to provide you with exceptional heart care, we have created designated Provider Care Teams. These Care Teams include your primary Cardiologist (physician) and Advanced Practice Providers (APPs- Physician Assistants and Nurse Practitioners) who all work together to provide you with the care you need, when you need it.   You may see any of the following providers on your designated Care Team at your next follow up: Dr Arvilla Meres Dr Marca Ancona Dr. Marcos Eke, NP Robbie Lis, [place] [facility] [place], [place] Brynda Peon, NP Karle Plumber, PharmD   Please be sure to bring in all your medications bottles to every appointment.    Thank you for choosing  [facility]
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
## 2022-10-05 ENCOUNTER — Other Ambulatory Visit: Payer: Self-pay

## 2022-10-05 ENCOUNTER — Emergency Department (HOSPITAL_BASED_OUTPATIENT_CLINIC_OR_DEPARTMENT_OTHER): Payer: Medicaid Other

## 2022-10-05 ENCOUNTER — Emergency Department (HOSPITAL_BASED_OUTPATIENT_CLINIC_OR_DEPARTMENT_OTHER)
Admission: EM | Admit: 2022-10-05 | Discharge: 2022-10-05 | Disposition: A | Discharge status: Home / Self Care | Payer: Medicaid Other | Attending: Emergency Medicine | Admitting: Emergency Medicine

## 2022-10-05 ENCOUNTER — Telehealth (HOSPITAL_COMMUNITY): Payer: Self-pay

## 2022-10-05 ENCOUNTER — Encounter (HOSPITAL_BASED_OUTPATIENT_CLINIC_OR_DEPARTMENT_OTHER): Payer: Self-pay | Admitting: Emergency Medicine

## 2022-10-05 DIAGNOSIS — I509 Heart failure, unspecified: Secondary | ICD-10-CM | POA: Diagnosis not present

## 2022-10-05 DIAGNOSIS — Z794 Long term (current) use of insulin: Secondary | ICD-10-CM | POA: Diagnosis not present

## 2022-10-05 DIAGNOSIS — R1084 Generalized abdominal pain: Secondary | ICD-10-CM | POA: Diagnosis not present

## 2022-10-05 DIAGNOSIS — R1013 Epigastric pain: Secondary | ICD-10-CM | POA: Diagnosis not present

## 2022-10-05 DIAGNOSIS — I110 Hypertensive heart disease with heart failure: Secondary | ICD-10-CM | POA: Diagnosis not present

## 2022-10-05 DIAGNOSIS — Z79899 Other long term (current) drug therapy: Secondary | ICD-10-CM | POA: Diagnosis not present

## 2022-10-05 DIAGNOSIS — E039 Hypothyroidism, unspecified: Secondary | ICD-10-CM | POA: Insufficient documentation

## 2022-10-05 DIAGNOSIS — E119 Type 2 diabetes mellitus without complications: Secondary | ICD-10-CM | POA: Insufficient documentation

## 2022-10-05 DIAGNOSIS — R109 Unspecified abdominal pain: Secondary | ICD-10-CM | POA: Diagnosis not present

## 2022-10-05 DIAGNOSIS — Z7982 Long term (current) use of aspirin: Secondary | ICD-10-CM | POA: Diagnosis not present

## 2022-10-05 DIAGNOSIS — R160 Hepatomegaly, not elsewhere classified: Secondary | ICD-10-CM | POA: Diagnosis not present

## 2022-10-05 LAB — CBC WITH DIFFERENTIAL/PLATELET
Abs Immature Granulocytes: 0.02 10*3/uL (ref 0.00–0.07)
Basophils Absolute: 0 10*3/uL (ref 0.0–0.1)
Basophils Relative: 0 %
Eosinophils Absolute: 0.2 10*3/uL (ref 0.0–0.5)
Eosinophils Relative: 3 %
HCT: 29.3 % — ABNORMAL LOW (ref 36.0–46.0)
Hemoglobin: 9.7 g/dL — ABNORMAL LOW (ref 12.0–15.0)
Immature Granulocytes: 0 %
Lymphocytes Relative: 46 %
Lymphs Abs: 2.8 10*3/uL (ref 0.7–4.0)
MCH: 28.6 pg (ref 26.0–34.0)
MCHC: 33.1 g/dL (ref 30.0–36.0)
MCV: 86.4 fL (ref 80.0–100.0)
Monocytes Absolute: 0.5 10*3/uL (ref 0.1–1.0)
Monocytes Relative: 9 %
Neutro Abs: 2.6 10*3/uL (ref 1.7–7.7)
Neutrophils Relative %: 42 %
Platelets: 142 10*3/uL — ABNORMAL LOW (ref 150–400)
RBC: 3.39 MIL/uL — ABNORMAL LOW (ref 3.87–5.11)
RDW: 14.6 % (ref 11.5–15.5)
WBC: 6.1 10*3/uL (ref 4.0–10.5)
nRBC: 0 % (ref 0.0–0.2)

## 2022-10-05 LAB — COMPREHENSIVE METABOLIC PANEL
ALT: 45 U/L — ABNORMAL HIGH (ref 0–44)
AST: 40 U/L (ref 15–41)
Albumin: 4.6 g/dL (ref 3.5–5.0)
Alkaline Phosphatase: 60 U/L (ref 38–126)
Anion gap: 15 (ref 5–15)
BUN: 77 mg/dL — ABNORMAL HIGH (ref 6–20)
CO2: 24 mmol/L (ref 22–32)
Calcium: 9.3 mg/dL (ref 8.9–10.3)
Chloride: 97 mmol/L — ABNORMAL LOW (ref 98–111)
Creatinine, Ser: 2.62 mg/dL — ABNORMAL HIGH (ref 0.44–1.00)
GFR, Estimated: 24 mL/min — ABNORMAL LOW (ref >60)
Glucose, Bld: 104 mg/dL — ABNORMAL HIGH (ref 70–99)
Potassium: 3.4 mmol/L — ABNORMAL LOW (ref 3.5–5.1)
Sodium: 136 mmol/L (ref 135–145)
Total Bilirubin: 0.5 mg/dL (ref 0.3–1.2)
Total Protein: 7.9 g/dL (ref 6.5–8.1)

## 2022-10-05 LAB — LIPASE, BLOOD: Lipase: 42 U/L (ref 11–51)

## 2022-10-05 LAB — HCG, QUANTITATIVE, PREGNANCY: hCG, Beta Chain, Quant, S: <1 m[IU]/mL (ref <5)

## 2022-10-05 MED ORDER — ONDANSETRON HCL 4 MG/2ML IJ SOLN
4.0000 mg | Freq: Once | INTRAMUSCULAR | Status: AC
  Administered 2022-10-05: 4 mg via INTRAVENOUS
  Filled 2022-10-05: qty 2

## 2022-10-05 MED ORDER — KETOROLAC TROMETHAMINE 30 MG/ML IJ SOLN
30.0000 mg | Freq: Once | INTRAMUSCULAR | Status: DC
  Filled 2022-10-05: qty 1

## 2022-10-05 MED ORDER — TORSEMIDE 20 MG PO TABS: 40.0000 mg | ORAL_TABLET | Freq: Every day | ORAL | 2 refills | Status: DC

## 2022-10-05 MED ORDER — SODIUM CHLORIDE 0.9 % IV BOLUS
1000.0000 mL | Freq: Once | INTRAVENOUS | Status: AC
  Administered 2022-10-05: 1000 mL via INTRAVENOUS

## 2022-10-05 NOTE — Telephone Encounter (Signed)
-----   Message from Dolores Patty, MD sent at 10/04/2022  7:42 PM EDT ----- Has f/u in lipid clinic soon. Can you as her to decrease torsemide to 40 daily. If luid going back up can go back to 60 daily as needed.

## 2022-10-05 NOTE — Discharge Instructions (Signed)
Clear liquid diet for the next 12 hours, then slowly advance to normal as tolerated.  Follow-up with primary doctor in the next week.

## 2022-10-05 NOTE — ED Provider Notes (Signed)
Jennifer [name] Provider Note   CSN: [id number] Arrival date & time: 10/05/22  0450     History  Chief Complaint  Patient presents with   Abdominal Pain    Jennifer [name] is a 32 y.o. female.  Patient is a 32 year old female well-known to the emergency department for frequent visits.  She has extensive past medical history including astrocytoma with resection, type 2 diabetes, congestive heart failure, hypothyroidism, hypertension.  Patient presenting today with complaints of epigastric pain.  She reports a history of pancreatitis as well and states that this feels similar.  Pain started 30 minutes prior to her presentation to the ER.  She denies any fevers or chills.  She reports 1 episode of vomiting.  The history is provided by the patient.       Home Medications Prior to Admission medications   Medication Sig Start Date End Date Taking? Authorizing Provider  albuterol (VENTOLIN HFA) 108 (90 Base) MCG/ACT inhaler Inhale 1-2 puffs into the lungs every 6 (six) hours as needed for wheezing or shortness of breath. 12/31/18   Provider, Historical, [name]  aspirin EC 81 MG EC tablet Take 1 tablet (81 mg total) by mouth daily. Swallow whole. 12/07/20   Jennifer [name]  carvedilol (COREG) 3.125 MG tablet Take 1 tablet (3.125 mg total) by mouth 2 (two) times daily. 10/04/22   [name], [name], [name]  CREON [phone] units CPEP capsule Take 36,000 Units by mouth 3 (three) times daily.    Provider, Historical, [name]  dicyclomine (BENTYL) 20 MG tablet Take 1 tablet (20 mg total) by mouth 2 (two) times daily. 09/15/22   Jennifer [name]  DULoxetine (CYMBALTA) 60 MG capsule Take by mouth. 09/26/22   Provider, Historical, [name]  ergocalciferol (VITAMIN D2) 1.25 MG (50000 UT) capsule Take 50,000 Units by mouth every 7 (seven) days.    Provider, Historical, [name]  Evolocumab (REPATHA) 140 MG/ML SOSY Inject 140 mg into the skin See admin instructions. Inject 140  mg subcutaneously every other Monday evening    Provider, Historical, [name]  fenofibrate 160 MG tablet Take 1 tablet (160 mg total) by mouth daily. 12/07/20   [name], [name]  Insulin Pen Needle 32G X 4 MM MISC 1 Device by Does not apply route QID. For use with insulin pens 02/13/22   Jennifer [name]  insulin regular human CONCENTRATED (HUMULIN R U-500 KWIKPEN) 500 UNIT/ML KwikPen Inject 185 Units into the skin 3 (three) times daily with meals. 09/20/22 10/20/22  Jennifer [name]  ivabradine (CORLANOR) 7.5 MG TABS tablet Take 1 tablet (7.5 mg total) by mouth 2 (two) times daily with a meal. 08/11/22   Jennifer [name]  levocetirizine (XYZAL) 5 MG tablet Take 5 mg by mouth daily. 03/05/22   Provider, Historical, [name]  levothyroxine (SYNTHROID) 137 MCG tablet Take 137 mcg by mouth daily. 09/20/22   Provider, Historical, [name]  pantoprazole (PROTONIX) 40 MG tablet Take 40 mg by mouth daily. 12/24/19   Provider, Historical, [name]  pregabalin (LYRICA) 300 MG capsule Take 300 mg by mouth at bedtime.    Provider, Historical, [name]  prochlorperazine (COMPAZINE) 5 MG tablet Take 5 mg by mouth at bedtime.    Provider, Historical, [name]  rosuvastatin (CRESTOR) 40 MG tablet Take 40 mg by mouth daily. 08/09/21   Provider, Historical, [name]  sildenafil (REVATIO) 20 MG tablet Take 1 tablet (20 mg total) by mouth 3 (three) times daily. 05/11/22   [name], Jennifer Boom  R, [name]  sodium bicarbonate 650 MG tablet Take 650 mg by mouth 2 (two) times daily.    Provider, Historical, [name]  spironolactone (ALDACTONE) 25 MG tablet Take 25 mg by mouth daily.    Provider, Historical, [name]  torsemide (DEMADEX) 20 MG tablet Take 60 mg by mouth daily.    Provider, Historical, [name]  tretinoin (RETIN-A) 0.05 % cream Apply 1 application topically at bedtime. 01/17/21   Provider, Historical, [name]      Allergies    Ketamine, Erythromycin, Fentanyl, Maitake mushroom [maitake], Penicillin v, Shellfish allergy, Morphine, Penicillins, and Prednisone     Review of Systems   Review of Systems  All other systems reviewed and are negative.   Physical Exam Updated Vital Signs BP 130/73 (BP Location: Right Arm)   Pulse 94   Temp 98 F (36.7 C) (Oral)   Resp 16   Ht 4\' 9"  (1.448 m)   Wt 59.9 kg   LMP 09/15/2022 (Exact Date)   SpO2 98%   BMI 28.56 kg/m  Physical Exam Vitals and nursing note reviewed.  Constitutional:      General: [name] not in acute distress.    Appearance: [name] well-developed. [name] not diaphoretic.  HENT:     Head: Normocephalic and atraumatic.  Cardiovascular:     Rate and Rhythm: Normal rate and regular rhythm.     Heart sounds: No murmur heard.    No friction rub. No gallop.  Pulmonary:     Effort: Pulmonary effort is normal. No respiratory distress.     Breath sounds: Normal breath sounds. No wheezing.  Abdominal:     General: Bowel sounds are normal. There is no distension.     Palpations: Abdomen is soft.     Tenderness: There is abdominal tenderness in the epigastric area. There is no right CVA tenderness, left CVA tenderness, guarding or rebound.  Musculoskeletal:        General: Normal range of motion.     Cervical back: Normal range of motion and neck supple.  Skin:    General: Skin is warm and dry.  Neurological:     General: No focal deficit present.     Mental Status: [name] alert and oriented to person, place, and time.     ED Results / Procedures / Treatments   Labs (all labs ordered are listed, but only abnormal results are displayed) Labs Reviewed - No data to display  EKG None  Radiology ECHOCARDIOGRAM COMPLETE  Result Date: 10/04/2022    ECHOCARDIOGRAM REPORT   Patient Name:   Jennifer [name] Date of Exam: 10/04/2022 Medical Rec #:  [id number]         Height:       57.0 in Accession #:    [id number]        Weight:       130.0 lb Date of Birth:  25-Mar-1991         BSA:          1.498 m Patient Age:    32 years          BP:           93/61 mmHg Patient Gender: F                  HR:           87 bpm. Exam Location:  Outpatient Procedure: 2D Echo, Cardiac Doppler and Color Doppler Indications:    Congestive Heart Failure I50.9  History:  Patient has prior history of Echocardiogram examinations, most                 recent 06/10/2022. CHF, Arrythmias:Atrial Fibrillation; Risk                 Factors:Hypertension and Diabetes.  Sonographer:    Eulah Pont RDCS Referring Phys: [id number] DANIEL R [name] IMPRESSIONS  1. Left ventricular ejection fraction, by estimation, is 50 to 55%. The left ventricle has low normal function. The left ventricle has no regional wall motion abnormalities. Left ventricular diastolic parameters are indeterminate. Elevated left ventricular end-diastolic pressure.  2. Right ventricular systolic function is normal. The right ventricular size is normal. There is normal pulmonary artery systolic pressure. The estimated right ventricular systolic pressure is 22.0 mmHg.  3. The mitral valve is normal in structure. Mild mitral valve regurgitation. No evidence of mitral stenosis.  4. The aortic valve is tricuspid. Aortic valve regurgitation is mild. No aortic stenosis is present.  5. The inferior vena cava is normal in size with greater than 50% respiratory variability, suggesting right atrial pressure of 3 mmHg. FINDINGS  Left Ventricle: Left ventricular ejection fraction, by estimation, is 50 to 55%. The left ventricle has low normal function. The left ventricle has no regional wall motion abnormalities. The left ventricular internal cavity size was normal in size. There is no left ventricular hypertrophy. Left ventricular diastolic parameters are indeterminate. Elevated left ventricular end-diastolic pressure. Right Ventricle: The right ventricular size is normal. No increase in right ventricular wall thickness. Right ventricular systolic function is normal. There is normal pulmonary artery systolic pressure. The tricuspid regurgitant velocity is 2.18 m/s, and   with an assumed right atrial pressure of 3 mmHg, the estimated right ventricular systolic pressure is 22.0 mmHg. Left Atrium: Left atrial size was normal in size. Right Atrium: Right atrial size was normal in size. Pericardium: There is no evidence of pericardial effusion. Mitral Valve: The mitral valve is normal in structure. Mild mitral valve regurgitation. No evidence of mitral valve stenosis. Tricuspid Valve: The tricuspid valve is normal in structure. Tricuspid valve regurgitation is mild . No evidence of tricuspid stenosis. Aortic Valve: The aortic valve is tricuspid. Aortic valve regurgitation is mild. Aortic regurgitation PHT measures 465 msec. No aortic stenosis is present. Pulmonic Valve: The pulmonic valve was normal in structure. Pulmonic valve regurgitation is trivial. No evidence of pulmonic stenosis. Aorta: The aortic root is normal in size and structure. Venous: The inferior vena cava is normal in size with greater than 50% respiratory variability, suggesting right atrial pressure of 3 mmHg. IAS/Shunts: No atrial level shunt detected by color flow Doppler.  LEFT VENTRICLE PLAX 2D LVIDd:         4.60 cm     Diastology LVIDs:         3.00 cm     LV e' medial:    5.70 cm/s LV PW:         1.00 cm     LV E/e' medial:  16.7 LV IVS:        1.00 cm     LV e' lateral:   8.66 cm/s LVOT diam:     1.80 cm     LV E/e' lateral: 11.0 LV SV:         49 LV SV Index:   32 LVOT Area:     2.54 cm  LV Volumes (MOD) LV vol d, MOD A2C: 85.1 ml LV vol d, MOD A4C: 95.7 ml LV  vol s, MOD A2C: 39.2 ml LV vol s, MOD A4C: 43.5 ml LV SV MOD A2C:     45.9 ml LV SV MOD A4C:     95.7 ml LV SV MOD BP:      48.4 ml RIGHT VENTRICLE RV S prime:     11.30 cm/s TAPSE (M-mode): 1.6 cm LEFT ATRIUM             Index        RIGHT ATRIUM           Index LA diam:        3.00 cm 2.00 cm/m   RA Area:     10.60 cm LA Vol (A2C):   24.6 ml 16.42 ml/m  RA Volume:   22.20 ml  14.82 ml/m LA Vol (A4C):   24.2 ml 16.16 ml/m LA Biplane Vol: 25.3 ml  16.89 ml/m  AORTIC VALVE LVOT Vmax:   107.00 cm/s LVOT Vmean:  71.000 cm/s LVOT VTI:    0.191 m AI PHT:      465 msec  AORTA Ao Root diam: 2.60 cm Ao Asc diam:  2.80 cm MITRAL VALVE               TRICUSPID VALVE MV Area (PHT): 5.97 cm    TR Peak grad:   19.0 mmHg MV Decel Time: 127 msec    TR Vmax:        218.00 cm/s MV E velocity: 95.00 cm/s MV A velocity: 82.70 cm/s  SHUNTS MV E/A ratio:  1.15        Systemic VTI:  0.19 m                            Systemic Diam: 1.80 cm Armanda Magic [name] Electronically signed by Armanda Magic [name] Signature Date/Time: 10/04/2022/11:16:55 AM    Final     Procedures Procedures    Medications Ordered in ED Medications  sodium chloride 0.9 % bolus 1,000 mL (has no administration in time range)  ondansetron (ZOFRAN) injection 4 mg (has no administration in time range)  ketorolac (TORADOL) 30 MG/ML injection 30 mg (has no administration in time range)    ED Course/ Medical Decision Making/ A&P  Patient presenting with abdominal pain as described in the HPI.  She arrives here with stable vital signs and is afebrile.  There is mild tenderness in the epigastric region, but no peritoneal signs on exam.  Workup initiated including CBC, CMP, and lipase.  Lipase normal with no leukocytosis.  She has her baseline renal insufficiency, but no other significant electrolyte abnormalities.  CT scan of the abdomen and pelvis obtained showing no acute intra-abdominal process.  Patient given saline along with Zofran and seems to be feeling better.  At this point, I feel as though patient can safely be discharged.  [name] to adhere to a clear liquid diet for the next 12 hours, then slowly advance to normal as tolerated.  Patient asking for pain medication, however I see nothing in the workup that would warrant this.  Final Clinical Impression(s) / ED Diagnoses Final diagnoses:  None    Rx / DC Orders ED Discharge Orders     None         Geoffery Lyons, [name] 10/05/22  [phone]

## 2022-10-05 NOTE — Telephone Encounter (Signed)
Patient advised and verbalized understanding. Med list updated to reflect changes.   Meds ordered this encounter  Medications   torsemide (DEMADEX) 20 MG tablet    Sig: Take 2 tablets (40 mg total) by mouth daily. May take 1 additional tablet as needed    Dispense:  90 tablet    Refill:  2

## 2022-10-05 NOTE — ED Triage Notes (Signed)
Pt BIB [facility] from home for abd x 1 hour, pt reports feels consistent with previous pancreatitis, per EMS VSS en route, CBG 146

## 2022-10-06 ENCOUNTER — Telehealth: Payer: Self-pay

## 2022-10-06 NOTE — Transitions of Care (Post Inpatient/ED Visit) (Signed)
   10/06/2022  Name: Jennifer [name] MRN: [id number] DOB: 1990-04-02  Today's TOC FU Call Status: Today's TOC FU Call Status:: Unsuccessul Call (1st Attempt) Unsuccessful Call (1st Attempt) Date: 10/06/22  Attempted to reach the patient regarding the most recent Inpatient/ED visit.  Follow Up Plan: Additional outreach attempts will be made to reach the patient to complete the Transitions of Care (Post Inpatient/ED visit) call.   Abelino Derrick, [facility]  Managed [facility] Social Worker [phone]

## 2022-10-07 ENCOUNTER — Telehealth: Payer: Self-pay

## 2022-10-07 NOTE — Transitions of Care (Post Inpatient/ED Visit) (Signed)
10/07/2022  Name: Jennifer [name] MRN: [id number] DOB: 1990-07-29  Today's TOC FU Call Status: Today's TOC FU Call Status:: Successful TOC FU Call Competed TOC FU Call Complete Date: 10/07/22  Transition Care Management Follow-up Telephone Call How have you been since you were released from the hospital?: Same Any questions or concerns?: Yes (Patient states the doctor she saw does not care and did not want to see what was wrong with her. States this is not the first time with this doctor.)  Items Reviewed: Did you receive and understand the discharge instructions provided?: Yes Medications obtained,verified, and reconciled?: No (none given) Any new allergies since your discharge?: No Dietary orders reviewed?: NA Do you have support at home?: Yes  Medications Reviewed Today: Medications Reviewed Today     Reviewed by Francis Dowse, RN (Registered Nurse) on 10/05/22 at 0457  Med List Status: <None>   Medication Order Taking? Sig Documenting Provider Last Dose Status Informant  albuterol (VENTOLIN HFA) 108 (90 Base) MCG/ACT inhaler [id number]  Inhale 1-2 puffs into the lungs every 6 (six) hours as needed for wheezing or shortness of breath. [provider]  Active Self           Med Note (ADSIDE, Novella Rob   Wed Jan 26, 2022 10:08 AM)    aspirin EC 81 MG EC tablet [id number]  Take 1 tablet (81 mg total) by mouth daily. Swallow whole. Pennie Banter, DO  Active Self  carvedilol (COREG) 3.125 MG tablet [id number]  Take 1 tablet (3.125 mg total) by mouth 2 (two) times daily. [name], [name], MD  Active   CREON [phone] units CPEP capsule [id number]  Take 36,000 Units by mouth 3 (three) times daily. [provider]  Active Self  dicyclomine (BENTYL) 20 MG tablet [id number]  Take 1 tablet (20 mg total) by mouth 2 (two) times daily. [name], [name], MD  Active Self  DULoxetine (CYMBALTA) 60 MG capsule [id number]  Take by mouth. [provider]  Active    ergocalciferol (VITAMIN D2) 1.25 MG (50000 UT) capsule [id number]  Take 50,000 Units by mouth every 7 (seven) days. [provider]  Active Self           Med Note Rosilyn Mings, Freddie Apley   Wed Jun 22, 2022  3:24 PM)    Evolocumab (REPATHA) 140 MG/ML SOSY [id number]  Inject 140 mg into the skin See admin instructions. Inject 140 mg subcutaneously every other Monday evening [provider]  Active Self           Med Note (ADSIDE, Novella Rob   Wed Jan 26, 2022 10:12 AM)    fenofibrate 160 MG tablet [id number]  Take 1 tablet (160 mg total) by mouth daily. Pennie Banter, DO  Active Self  Insulin Pen Needle 32G X 4 MM MISC [id number]  1 Device by Does not apply route QID. For use with insulin pens [name], MD  Active Self  insulin regular human CONCENTRATED (HUMULIN R U-500 KWIKPEN) 500 UNIT/ML KwikPen [id number]  Inject 185 Units into the skin 3 (three) times daily with meals. [name], MD  Active   ivabradine (CORLANOR) 7.5 MG TABS tablet [id number]  Take 1 tablet (7.5 mg total) by mouth 2 (two) times daily with a meal. [name], MD  Active Self  levocetirizine (XYZAL) 5 MG tablet [id number]  Take 5 mg by mouth daily. [provider]  Active Self  levothyroxine (SYNTHROID) 137 MCG tablet [id number]  Take  137 mcg by mouth daily. [provider]  Active   pantoprazole (PROTONIX) 40 MG tablet [id number]  Take 40 mg by mouth daily. [provider]  Active Self  pregabalin (LYRICA) 300 MG capsule [id number]  Take 300 mg by mouth at bedtime. [provider]  Active Self  prochlorperazine (COMPAZINE) 5 MG tablet [id number]  Take 5 mg by mouth at bedtime. [provider]  Active Self           Med Note Rosilyn Mings, Freddie Apley   Wed Jun 22, 2022  3:24 PM)    rosuvastatin (CRESTOR) 40 MG tablet [id number]  Take 40 mg by mouth daily. [provider]  Active Self  sildenafil (REVATIO) 20 MG tablet [id number]  Take 1 tablet (20 mg total) by  mouth 3 (three) times daily. [name], [name], MD  Active Self  sodium bicarbonate 650 MG tablet [id number]  Take 650 mg by mouth 2 (two) times daily. [provider]  Active Self  spironolactone (ALDACTONE) 25 MG tablet [id number]  Take 25 mg by mouth daily. [provider]  Active   torsemide (DEMADEX) 20 MG tablet [id number]  Take 60 mg by mouth daily. [provider]  Active Self  tretinoin (RETIN-A) 0.05 % cream [id number]  Apply 1 application topically at bedtime. [provider]  Active Self            Home Care and Equipment/Supplies: Were Home Health Services Ordered?: NA Any new equipment or medical supplies ordered?: NA  Functional Questionnaire: Do you need assistance with bathing/showering or dressing?: No Do you need assistance with meal preparation?: No Do you need assistance with eating?: No Do you have difficulty maintaining continence: No Do you need assistance with getting out of bed/getting out of a chair/moving?: No Do you have difficulty managing or taking your medications?: No  Follow up appointments reviewed: PCP Follow-up appointment confirmed?: Yes Date of PCP follow-up appointment?: 10/12/22 Specialist Hospital Follow-up appointment confirmed?: No Do you need transportation to your follow-up appointment?: No Do you understand care options if your condition(s) worsen?: Yes-patient verbalized understanding   SDOH Interventions    Flowsheet Row Telephone from 08/11/2022 in [facility] from 04/05/2022 in [facility] Telephone from 08/19/2021 in [facility] ED to Hosp-Admission (Discharged) from 11/22/2020 in [facility]  SDOH Interventions      Food Insecurity Interventions -- -- Other (Comment)  [food pantries] Intervention Not Indicated  Housing  Interventions -- Other (Comment)  [provided resources for financial assistance] Intervention Not Indicated Intervention Not Indicated  Transportation Interventions Payor Benefit  [referred to managed medicaid team] Payor Benefit Intervention Not Indicated Intervention Not Indicated  Financial Strain Interventions -- Other (Comment)  [provided resources for financial resources] -- Intervention Not Indicated       Gus Puma, Kenard Gower, [facility]  Managed [facility] Social Worker [phone]

## 2022-10-12 ENCOUNTER — Ambulatory Visit (INDEPENDENT_AMBULATORY_CARE_PROVIDER_SITE_OTHER): Payer: Medicaid Other | Admitting: Podiatry

## 2022-10-12 ENCOUNTER — Telehealth (HOSPITAL_COMMUNITY): Payer: Self-pay | Admitting: Cardiology

## 2022-10-12 DIAGNOSIS — Z7689 Persons encountering health services in other specified circumstances: Secondary | ICD-10-CM | POA: Diagnosis not present

## 2022-10-12 DIAGNOSIS — D492 Neoplasm of unspecified behavior of bone, soft tissue, and skin: Secondary | ICD-10-CM

## 2022-10-12 NOTE — Telephone Encounter (Signed)
Patient called to report not feeling well most of the day. Would like to come off another B/P med. -hydralazine was recently discontinued  -currently taking corlanor and coreg   Attempted to contact for details  [facility]

## 2022-10-12 NOTE — Telephone Encounter (Signed)
Very dizzy and light headed Unable to stand  B/p reading  7/16 144/91-66/52-116/59 (meds taken all day) 7/17  109/70-135/71-136/102 (no b/p meds @ last reading)

## 2022-10-12 NOTE — Progress Notes (Signed)
Subjective:  Patient ID: Jennifer [name], female    DOB: [birth year],  MRN: [id number]  Chief Complaint  Patient presents with   Callouses    32 y.o. female presents with the above complaint.  Patient presents with right submet 2 and left submetatarsal 3 porokeratotic lesion painful to touch is progressive and worse worse with ambulation worse with pressure she has not seen MRIs prior to seeing me.  She would like to discuss treatment options she is a diabetic.  [name] worried that it may turn an ulceration.  She has not seen and was prior to seeing me pain scale 7 out of 10 dull achy in nature   Review of Systems: Negative except as noted in the HPI. Denies N/V/F/Ch.  Past Medical History:  Diagnosis Date   Afib (HCC) 05/12/2021   Brain tumor (HCC) 03/29/1995   astrocytoma   CHF (congestive heart failure) (HCC)    Cholesterosis    CKD (chronic kidney disease) stage 4, GFR 15-29 ml/min (HCC) 05/13/2021   DM (diabetes mellitus) (HCC) 10/10/2018   Fatty liver    HTN (hypertension) 10/10/2018   Hypothyroidism 10/10/2018   Lipoprotein deficiency    Lung disease    longevity long term   Pancreatitis    Polycystic ovary syndrome     Current Outpatient Medications:    albuterol (VENTOLIN HFA) 108 (90 Base) MCG/ACT inhaler, Inhale 1-2 puffs into the lungs every 6 (six) hours as needed for wheezing or shortness of breath., Disp: , Rfl:    aspirin EC 81 MG EC tablet, Take 1 tablet (81 mg total) by mouth daily. Swallow whole., Disp: 30 tablet, Rfl: 1   carvedilol (COREG) 3.125 MG tablet, Take 1 tablet (3.125 mg total) by mouth 2 (two) times daily., Disp: 60 tablet, Rfl: 11   CREON 36000-114000 units CPEP capsule, Take 36,000 Units by mouth 3 (three) times daily., Disp: , Rfl:    dicyclomine (BENTYL) 20 MG tablet, Take 1 tablet (20 mg total) by mouth 2 (two) times daily., Disp: 20 tablet, Rfl: 0   doxycycline (VIBRAMYCIN) 100 MG capsule, Take 1 capsule (100 mg total) by mouth 2 (two)  times daily., Disp: 20 capsule, Rfl: 0   DULoxetine (CYMBALTA) 60 MG capsule, Take by mouth., Disp: , Rfl:    ergocalciferol (VITAMIN D2) 1.25 MG (50000 UT) capsule, Take 50,000 Units by mouth every 7 (seven) days., Disp: , Rfl:    Evolocumab (REPATHA) 140 MG/ML SOSY, Inject 140 mg into the skin See admin instructions. Inject 140 mg subcutaneously every other Monday evening, Disp: , Rfl:    fenofibrate 160 MG tablet, Take 1 tablet (160 mg total) by mouth daily., Disp: 30 tablet, Rfl: 1   Insulin Pen Needle 32G X 4 MM MISC, 1 Device by Does not apply route QID. For use with insulin pens, Disp: 100 each, Rfl: 0   insulin regular human CONCENTRATED (HUMULIN R U-500 KWIKPEN) 500 UNIT/ML KwikPen, Inject 185 Units into the skin 3 (three) times daily with meals., Disp: 33.3 mL, Rfl: 0   ivabradine (CORLANOR) 7.5 MG TABS tablet, Take 1 tablet (7.5 mg total) by mouth 2 (two) times daily with a meal., Disp: 60 tablet, Rfl: 6   levocetirizine (XYZAL) 5 MG tablet, Take 5 mg by mouth daily., Disp: , Rfl:    levothyroxine (SYNTHROID) 137 MCG tablet, Take 137 mcg by mouth daily., Disp: , Rfl:    pantoprazole (PROTONIX) 40 MG tablet, Take 40 mg by mouth daily., Disp: , Rfl:  pregabalin (LYRICA) 300 MG capsule, Take 300 mg by mouth at bedtime., Disp: , Rfl:    prochlorperazine (COMPAZINE) 5 MG tablet, Take 5 mg by mouth at bedtime., Disp: , Rfl:    rosuvastatin (CRESTOR) 40 MG tablet, Take 40 mg by mouth daily., Disp: , Rfl:    sodium bicarbonate 650 MG tablet, Take 650 mg by mouth 2 (two) times daily., Disp: , Rfl:    spironolactone (ALDACTONE) 25 MG tablet, Take 25 mg by mouth daily., Disp: , Rfl:    torsemide (DEMADEX) 20 MG tablet, Take 2 tablets (40 mg total) by mouth daily. May take 1 additional tablet as needed, Disp: 90 tablet, Rfl: 2   tretinoin (RETIN-A) 0.05 % cream, Apply 1 application topically at bedtime., Disp: , Rfl:   Social History   Tobacco Use  Smoking Status Never  Smokeless Tobacco  Never    Allergies  Allergen Reactions   Ketamine Other (See Comments)    In vegetative state for 15 minutes per pt   Erythromycin     unk   Fentanyl Nausea And Vomiting   Maitake Mushroom [Maitake] Itching    Itchy throat   Penicillin V Itching    Gi upset   Shellfish Allergy Other (See Comments)    Pt has never had shellfish but tested positive on allergy test. Pt states contrast in CT is okay   Morphine Itching and Rash   Penicillins Rash    Has patient had a PCN reaction causing immediate rash, facial/tongue/throat swelling, SOB or lightheadedness with hypotension: Y Has patient had a PCN reaction causing severe rash involving mucus membranes or skin necrosis: Y Has patient had a PCN reaction that required hospitalization: N Has patient had a PCN reaction occurring within the last 10 years: Y If all of the above answers are "NO", then may proceed with Cephalosporin use.    Prednisone Rash   Objective:  There were no vitals filed for this visit. There is no height or weight on file to calculate BMI. Constitutional Well developed. Well nourished.  Vascular Dorsalis pedis pulses palpable bilaterally. Posterior tibial pulses palpable bilaterally. Capillary refill normal to all digits.  No cyanosis or clubbing noted. Pedal hair growth normal.  Neurologic Normal speech. Oriented to person, place, and time. Epicritic sensation to light touch grossly present bilaterally.  Dermatologic Left submetatarsal 2 right submetatarsal 3 porokeratosis.  Central nucleated core noted.  Pain on palpation to the lesion no pinpoint bleeding noted  Orthopedic: Normal joint ROM without pain or crepitus bilaterally. No visible deformities. No bony tenderness.   Radiographs: None Assessment:   1. Skin neoplasm    Plan:  Patient was evaluated and treated and all questions answered.  Left submetatarsal 2 and right submetatarsal 3 porokeratosis -All questions and concerns were discussed  with the patient extensive detail given The pain that she is having she will benefit from aggressive debridement of the lesion using chisel blade handle the lesion was debrided down to healthy dry tissue no complication noted no pinpoint bleeding noted. -Shoe gear modification was discussed  No follow-ups on file.

## 2022-10-13 ENCOUNTER — Other Ambulatory Visit (HOSPITAL_COMMUNITY): Payer: Self-pay

## 2022-10-13 NOTE — Telephone Encounter (Signed)
Stop sildenafil.   Please call and also notify HF Paramedic.   Alvin Diffee NP-C  1:07 PM

## 2022-10-13 NOTE — Telephone Encounter (Signed)
LMOM   Message to paramedicine as Lorain Childes
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
## 2022-10-14 NOTE — Addendum Note (Signed)
Addended by: Theresia Bough on: 10/14/2022 09:14 AM   Modules accepted: Orders

## 2022-10-17 ENCOUNTER — Emergency Department (HOSPITAL_BASED_OUTPATIENT_CLINIC_OR_DEPARTMENT_OTHER): Payer: Medicaid Other | Admitting: Radiology

## 2022-10-17 ENCOUNTER — Encounter (HOSPITAL_BASED_OUTPATIENT_CLINIC_OR_DEPARTMENT_OTHER): Payer: Self-pay

## 2022-10-17 ENCOUNTER — Emergency Department (HOSPITAL_BASED_OUTPATIENT_CLINIC_OR_DEPARTMENT_OTHER)
Admission: EM | Admit: 2022-10-17 | Discharge: 2022-10-18 | Disposition: A | Discharge status: Home / Self Care | Payer: Medicaid Other | Attending: Emergency Medicine | Admitting: Emergency Medicine

## 2022-10-17 ENCOUNTER — Other Ambulatory Visit: Payer: Self-pay

## 2022-10-17 DIAGNOSIS — E039 Hypothyroidism, unspecified: Secondary | ICD-10-CM | POA: Insufficient documentation

## 2022-10-17 DIAGNOSIS — E781 Pure hyperglyceridemia: Secondary | ICD-10-CM | POA: Diagnosis not present

## 2022-10-17 DIAGNOSIS — Z794 Long term (current) use of insulin: Secondary | ICD-10-CM | POA: Diagnosis not present

## 2022-10-17 DIAGNOSIS — L739 Follicular disorder, unspecified: Secondary | ICD-10-CM | POA: Diagnosis not present

## 2022-10-17 DIAGNOSIS — E1165 Type 2 diabetes mellitus with hyperglycemia: Secondary | ICD-10-CM | POA: Insufficient documentation

## 2022-10-17 DIAGNOSIS — Z79899 Other long term (current) drug therapy: Secondary | ICD-10-CM | POA: Diagnosis not present

## 2022-10-17 DIAGNOSIS — I130 Hypertensive heart and chronic kidney disease with heart failure and stage 1 through stage 4 chronic kidney disease, or unspecified chronic kidney disease: Secondary | ICD-10-CM | POA: Insufficient documentation

## 2022-10-17 DIAGNOSIS — Z1152 Encounter for screening for COVID-19: Secondary | ICD-10-CM | POA: Insufficient documentation

## 2022-10-17 DIAGNOSIS — R2233 Localized swelling, mass and lump, upper limb, bilateral: Secondary | ICD-10-CM | POA: Diagnosis present

## 2022-10-17 DIAGNOSIS — R1084 Generalized abdominal pain: Secondary | ICD-10-CM | POA: Diagnosis not present

## 2022-10-17 DIAGNOSIS — R591 Generalized enlarged lymph nodes: Secondary | ICD-10-CM

## 2022-10-17 DIAGNOSIS — Z7982 Long term (current) use of aspirin: Secondary | ICD-10-CM | POA: Diagnosis not present

## 2022-10-17 DIAGNOSIS — I509 Heart failure, unspecified: Secondary | ICD-10-CM | POA: Insufficient documentation

## 2022-10-17 DIAGNOSIS — E139 Other specified diabetes mellitus without complications: Secondary | ICD-10-CM | POA: Diagnosis not present

## 2022-10-17 DIAGNOSIS — R599 Enlarged lymph nodes, unspecified: Secondary | ICD-10-CM | POA: Diagnosis not present

## 2022-10-17 DIAGNOSIS — N189 Chronic kidney disease, unspecified: Secondary | ICD-10-CM | POA: Diagnosis not present

## 2022-10-17 DIAGNOSIS — R739 Hyperglycemia, unspecified: Secondary | ICD-10-CM

## 2022-10-17 DIAGNOSIS — R61 Generalized hyperhidrosis: Secondary | ICD-10-CM | POA: Diagnosis not present

## 2022-10-17 DIAGNOSIS — R590 Localized enlarged lymph nodes: Secondary | ICD-10-CM | POA: Diagnosis not present

## 2022-10-17 LAB — CBC WITH DIFFERENTIAL/PLATELET
Abs Immature Granulocytes: 0.02 10*3/uL (ref 0.00–0.07)
Basophils Absolute: 0.1 10*3/uL (ref 0.0–0.1)
Basophils Relative: 1 %
Eosinophils Absolute: 0.4 10*3/uL (ref 0.0–0.5)
Eosinophils Relative: 4 %
HCT: 31.9 % — ABNORMAL LOW (ref 36.0–46.0)
Hemoglobin: 10.9 g/dL — ABNORMAL LOW (ref 12.0–15.0)
Immature Granulocytes: 0 %
Lymphocytes Relative: 32 %
Lymphs Abs: 2.7 10*3/uL (ref 0.7–4.0)
MCH: 29.4 pg (ref 26.0–34.0)
MCHC: 34.2 g/dL (ref 30.0–36.0)
MCV: 86 fL (ref 80.0–100.0)
Monocytes Absolute: 0.5 10*3/uL (ref 0.1–1.0)
Monocytes Relative: 6 %
Neutro Abs: 4.8 10*3/uL (ref 1.7–7.7)
Neutrophils Relative %: 57 %
Platelets: 226 10*3/uL (ref 150–400)
RBC: 3.71 MIL/uL — ABNORMAL LOW (ref 3.87–5.11)
RDW: 14.1 % (ref 11.5–15.5)
WBC: 8.5 10*3/uL (ref 4.0–10.5)
nRBC: 0 % (ref 0.0–0.2)

## 2022-10-17 LAB — PROTIME-INR
INR: 1 (ref 0.8–1.2)
Prothrombin Time: 13.3 seconds (ref 11.4–15.2)

## 2022-10-17 LAB — D-DIMER, QUANTITATIVE: D-Dimer, Quant: 0.67 ug/mL-FEU — ABNORMAL HIGH (ref 0.00–0.50)

## 2022-10-17 LAB — MONONUCLEOSIS SCREEN: Mono Screen: NEGATIVE

## 2022-10-17 LAB — I-STAT VENOUS BLOOD GAS, ED
Acid-base deficit: 1 mmol/L (ref 0.0–2.0)
Bicarbonate: 23.9 mmol/L (ref 20.0–28.0)
Calcium, Ion: 1.36 mmol/L (ref 1.15–1.40)
HCT: 35 % — ABNORMAL LOW (ref 36.0–46.0)
Hemoglobin: 11.9 g/dL — ABNORMAL LOW (ref 12.0–15.0)
O2 Saturation: 92 %
Potassium: 5.8 mmol/L — ABNORMAL HIGH (ref 3.5–5.1)
Sodium: 129 mmol/L — ABNORMAL LOW (ref 135–145)
TCO2: 25 mmol/L (ref 22–32)
pCO2, Ven: 40.1 mmHg — ABNORMAL LOW (ref 44–60)
pH, Ven: 7.382 (ref 7.25–7.43)
pO2, Ven: 64 mmHg — ABNORMAL HIGH (ref 32–45)

## 2022-10-17 LAB — CBG MONITORING, ED: Glucose-Capillary: 425 mg/dL — ABNORMAL HIGH (ref 70–99)

## 2022-10-17 LAB — HCG, QUANTITATIVE, PREGNANCY: hCG, Beta Chain, Quant, S: <1 m[IU]/mL (ref <5)

## 2022-10-17 LAB — SARS CORONAVIRUS 2 BY RT PCR: SARS Coronavirus 2 by RT PCR: NEGATIVE

## 2022-10-17 MED ORDER — LACTATED RINGERS IV BOLUS
1000.0000 mL | Freq: Once | INTRAVENOUS | Status: AC
  Administered 2022-10-17: 1000 mL via INTRAVENOUS

## 2022-10-17 MED ORDER — CARVEDILOL 6.25 MG PO TABS
3.1250 mg | ORAL_TABLET | Freq: Two times a day (BID) | ORAL | Status: DC
  Administered 2022-10-18: 3.125 mg via ORAL
  Filled 2022-10-17: qty 1

## 2022-10-17 MED ORDER — DOXYCYCLINE HYCLATE 100 MG PO TABS
100.0000 mg | ORAL_TABLET | Freq: Once | ORAL | Status: AC
  Administered 2022-10-17: 100 mg via ORAL
  Filled 2022-10-17: qty 1

## 2022-10-17 MED ORDER — LACTATED RINGERS IV BOLUS: 1000.0000 mL | Freq: Once | INTRAVENOUS | Status: DC

## 2022-10-17 MED ORDER — DICYCLOMINE HCL 10 MG PO CAPS
20.0000 mg | ORAL_CAPSULE | Freq: Two times a day (BID) | ORAL | Status: DC
  Administered 2022-10-18: 20 mg via ORAL
  Filled 2022-10-17: qty 2

## 2022-10-17 NOTE — ED Triage Notes (Signed)
Patient here POV from Home.  Endorses Abscesses or Cysts located to a few areas such as bilateral Axilla and Right Chest. Painful with no drainage. No Known Fevers. Also notes a few to groin area.   NAD Noted during Triage. A&Ox4. Gcs 15. Ambulatory.

## 2022-10-17 NOTE — ED Provider Notes (Signed)
[facility] EMERGENCY DEPARTMENT AT [facility] Provider Note   CSN: [id number] Arrival date & time: 10/17/22  1914     History  Chief Complaint  Patient presents with   Abscess    Latorie Montesano is a 32 y.o. female.   Abscess    32 year old female with medical history significant for astrocytoma, chronic pancreatitis and hyperglyceridemia, fatty liver disease, diabetes mellitus with DKA, hypothyroidism, hypertension, CHF with diastolic dysfunction, CKD who presents to the emergency department with a chief complaint of tender lymphadenopathy.  The patient states that she has had enlarged painful swelling in her bilateral armpits, on her right breast near the nipple and in her groin bilaterally.  She endorses some surrounding redness.  No fevers or chills.  She states that her blood sugars have been over 400 at home.  She denies any shortness of breath, endorses acute on chronic abdominal pain, no nausea or vomiting.  She denies any drainage or swelling.  She denies any weight loss, endorses night sweats.  She denies any exposure to ticks, tick bites or exposure to cats or cat scratches.  [name] not concerned for sexually transmitted infection but consents to STI testing.  Home Medications Prior to Admission medications   Medication Sig Start Date End Date Taking? Authorizing Provider  albuterol (VENTOLIN HFA) 108 (90 Base) MCG/ACT inhaler Inhale 1-2 puffs into the lungs every 6 (six) hours as needed for wheezing or shortness of breath. 12/31/18   [provider]  aspirin EC 81 MG EC tablet Take 1 tablet (81 mg total) by mouth daily. Swallow whole. 12/07/20   Pennie Banter, DO  carvedilol (COREG) 3.125 MG tablet Take 1 tablet (3.125 mg total) by mouth 2 (two) times daily. 10/04/22   [name], [name], MD  CREON [phone] units CPEP capsule Take 36,000 Units by mouth 3 (three) times daily.    [provider]  dicyclomine (BENTYL) 20 MG tablet Take 1  tablet (20 mg total) by mouth 2 (two) times daily. 09/15/22   [name], [name], MD  DULoxetine (CYMBALTA) 60 MG capsule Take by mouth. 09/26/22   [provider]  ergocalciferol (VITAMIN D2) 1.25 MG (50000 UT) capsule Take 50,000 Units by mouth every 7 (seven) days.    [provider]  Evolocumab (REPATHA) 140 MG/ML SOSY Inject 140 mg into the skin See admin instructions. Inject 140 mg subcutaneously every other Monday evening    [provider]  fenofibrate 160 MG tablet Take 1 tablet (160 mg total) by mouth daily. 12/07/20   [name], DO  Insulin Pen Needle 32G X 4 MM MISC 1 Device by Does not apply route QID. For use with insulin pens 02/13/22   [name], MD  insulin regular human CONCENTRATED (HUMULIN R U-500 KWIKPEN) 500 UNIT/ML KwikPen Inject 185 Units into the skin 3 (three) times daily with meals. 09/20/22 10/20/22  [name], MD  ivabradine (CORLANOR) 7.5 MG TABS tablet Take 1 tablet (7.5 mg total) by mouth 2 (two) times daily with a meal. 08/11/22   [name], MD  levocetirizine (XYZAL) 5 MG tablet Take 5 mg by mouth daily. 03/05/22   [provider]  levothyroxine (SYNTHROID) 137 MCG tablet Take 137 mcg by mouth daily. 09/20/22   [provider]  pantoprazole (PROTONIX) 40 MG tablet Take 40 mg by mouth daily. 12/24/19   [provider]  pregabalin (LYRICA) 300 MG capsule Take 300 mg by mouth at bedtime.    [provider]  prochlorperazine (COMPAZINE) 5 MG tablet Take 5 mg by mouth at bedtime.    [provider]  rosuvastatin (CRESTOR) 40 MG tablet Take 40 mg by mouth daily. 08/09/21   [provider]  sodium bicarbonate 650 MG tablet Take 650 mg by mouth 2 (two) times daily.    [provider]  spironolactone (ALDACTONE) 25 MG tablet Take 25 mg by mouth daily.    [provider]  torsemide (DEMADEX) 20 MG tablet Take 2 tablets (40 mg total) by mouth daily. May take 1  additional tablet as needed 10/05/22   [name], [name], MD  tretinoin (RETIN-A) 0.05 % cream Apply 1 application topically at bedtime. 01/17/21   [provider]      Allergies    Ketamine, Erythromycin, Fentanyl, Maitake mushroom [maitake], Penicillin v, Shellfish allergy, Morphine, Penicillins, and Prednisone    Review of Systems   Review of Systems  All other systems reviewed and are negative.   Physical Exam Updated Vital Signs BP (!) 197/96   Pulse (!) 111   Temp 98.2 F (36.8 C) (Oral)   Resp 16   Ht 4\' 9"  (1.448 m)   Wt 60.3 kg   LMP 09/15/2022 (Exact Date)   SpO2 97%   BMI 28.78 kg/m  Physical Exam Vitals and nursing note reviewed.  Constitutional:      General: [name] not in acute distress.    Appearance: [name] well-developed.  HENT:     Head: Normocephalic and atraumatic.  Eyes:     Conjunctiva/sclera: Conjunctivae normal.  Cardiovascular:     Rate and Rhythm: Regular rhythm. Tachycardia present.  Pulmonary:     Effort: Pulmonary effort is normal. No respiratory distress.     Breath sounds: Normal breath sounds.  Chest:     Comments: Right breast nodule Abdominal:     Palpations: Abdomen is soft.     Tenderness: There is generalized abdominal tenderness. There is no guarding.  Musculoskeletal:        General: No swelling.     Cervical back: Neck supple.     Comments: Bilateral axial tender nodules, consistent with likely tender lymphadenitis versus folliculitis.  Skin:    General: Skin is warm and dry.     Capillary Refill: Capillary refill takes less than 2 seconds.  Neurological:     Mental Status: [name] alert.  Psychiatric:        Mood and Affect: Mood normal.     ED Results / Procedures / Treatments   Labs (all labs ordered are listed, but only abnormal results are displayed) Labs Reviewed  D-DIMER, QUANTITATIVE - Abnormal; Notable for the following components:      Result Value   D-Dimer, Quant 0.67 (*)    All other  components within normal limits  CBC WITH DIFFERENTIAL/PLATELET - Abnormal; Notable for the following components:   RBC 3.71 (*)    Hemoglobin 10.9 (*)    HCT 31.9 (*)    All other components within normal limits  CBG MONITORING, ED - Abnormal; Notable for the following components:   Glucose-Capillary 425 (*)    All other components within normal limits  I-STAT VENOUS BLOOD GAS, ED - Abnormal; Notable for the following components:   pCO2, Ven 40.1 (*)    pO2, Ven 64 (*)    Sodium 129 (*)    Potassium 5.8 (*)    HCT 35.0 (*)    Hemoglobin 11.9 (*)    All other  components within normal limits  SARS CORONAVIRUS 2 BY RT PCR  HCG, QUANTITATIVE, PREGNANCY  MONONUCLEOSIS SCREEN  PROTIME-INR  CBC WITH DIFFERENTIAL/PLATELET  TRIGLYCERIDES  RPR  BETA-HYDROXYBUTYRIC ACID  COMPREHENSIVE METABOLIC PANEL  HIV ANTIBODY (ROUTINE TESTING W REFLEX)    EKG None  Radiology DG Chest 2 View  Result Date: 10/17/2022 CLINICAL DATA:  Lymphadenopathy, night sweats EXAM: CHEST - 2 VIEW COMPARISON:  06/15/2022 FINDINGS: Heart and mediastinal contours are within normal limits. No focal opacities or effusions. No acute bony abnormality. Again seen is the pressure monitoring device in the left lower lobe pulmonary artery, stable. IMPRESSION: No active cardiopulmonary disease. Electronically Signed   By: Charlett Nose M.D.   On: 10/17/2022 21:28    Procedures Procedures    Medications Ordered in ED Medications  carvedilol (COREG) tablet 3.125 mg (has no administration in time range)  dicyclomine (BENTYL) capsule 20 mg (has no administration in time range)  lactated ringers bolus 1,000 mL (0 mLs Intravenous Stopped 10/17/22 2341)  doxycycline (VIBRA-TABS) tablet 100 mg (100 mg Oral Given 10/17/22 2351)    ED Course/ Medical Decision Making/ A&P                             Medical Decision Making Amount and/or Complexity of Data Reviewed Labs: ordered. Radiology: ordered.  Risk Prescription  drug management.    32 year old female with medical history significant for astrocytoma, chronic pancreatitis and hyperglyceridemia, fatty liver disease, diabetes mellitus with DKA, hypothyroidism, hypertension, CHF with diastolic dysfunction, CKD who presents to the emergency department with a chief complaint of tender lymphadenopathy.  The patient states that she has had enlarged painful swelling in her bilateral armpits, on her right breast near the nipple and in her groin bilaterally.  She endorses some surrounding redness.  No fevers or chills.  She states that her blood sugars have been over 400 at home.  She denies any shortness of breath, endorses acute on chronic abdominal pain, no nausea or vomiting.  She denies any drainage or swelling.  She denies any weight loss, endorses night sweats.  She denies any exposure to ticks, tick bites or exposure to cats or cat scratches.  [name] not concerned for sexually transmitted infection but consents to STI testing.  On arrival, the patient was afebrile, tachycardic heart rate 123, BP 175/119, saturating 97% on room air.  Presenting with potential folliculitis versus tender lymphadenitis.  Endorses night sweats, no history of cat scratch.  No tick bites.  Lower suspicion for STI.  Considered malignancy in the setting of multiple tender lymph nodes.  Considered DKA versus HHS versus hypertriglyceridemia requiring admission.  Broad workup initiated.  IV access was obtained and the patient was administered an IV fluid bolus.  Initial VBG did not reveal an acidosis with a pH of 7.38, bicarbonate of 24, initial CBG was significant for hyperglycemia to 425.  D-dimer was elevated at 0.67.  Given the patient's abdominal pain, considered structuring, diverticulitis pancreatitis.,  Considered PE.   CTA PE scan imaging and CT abdomen pelvis was ordered and pending at time of signout.  Remainder of patient laboratory evaluation also pending at time of signout to include  CMP and triglyceride levels and a beta hydroxybutyrate.  Plan at time of signout to reassess the patient following results of laboratory and CT imaging, disposition plan pending results of diagnostic testing and reassessment. 100mg  Doxycycline administered for folliculitis. Signout given to Dr, Bernette Mayers at [phone].  Final Clinical Impression(s) / ED Diagnoses Final diagnoses:  Lymphadenopathy  Folliculitis    Rx / DC Orders ED Discharge Orders     None         Ernie Avena, MD 10/17/22 2357

## 2022-10-18 ENCOUNTER — Encounter (HOSPITAL_BASED_OUTPATIENT_CLINIC_OR_DEPARTMENT_OTHER): Payer: Self-pay

## 2022-10-18 ENCOUNTER — Other Ambulatory Visit (HOSPITAL_COMMUNITY): Payer: Self-pay | Admitting: Internal Medicine

## 2022-10-18 ENCOUNTER — Emergency Department (HOSPITAL_BASED_OUTPATIENT_CLINIC_OR_DEPARTMENT_OTHER): Payer: Medicaid Other

## 2022-10-18 DIAGNOSIS — Z7689 Persons encountering health services in other specified circumstances: Secondary | ICD-10-CM | POA: Diagnosis not present

## 2022-10-18 DIAGNOSIS — R162 Hepatomegaly with splenomegaly, not elsewhere classified: Secondary | ICD-10-CM | POA: Diagnosis not present

## 2022-10-18 DIAGNOSIS — R109 Unspecified abdominal pain: Secondary | ICD-10-CM | POA: Diagnosis not present

## 2022-10-18 DIAGNOSIS — H34811 Central retinal vein occlusion, right eye, with macular edema: Secondary | ICD-10-CM | POA: Diagnosis not present

## 2022-10-18 DIAGNOSIS — R942 Abnormal results of pulmonary function studies: Secondary | ICD-10-CM | POA: Diagnosis not present

## 2022-10-18 LAB — COMPREHENSIVE METABOLIC PANEL
ALT: 67 U/L — ABNORMAL HIGH (ref 0–44)
AST: 71 U/L — ABNORMAL HIGH (ref 15–41)
Albumin: 3.9 g/dL (ref 3.5–5.0)
Alkaline Phosphatase: 109 U/L (ref 38–126)
Anion gap: 15 (ref 5–15)
BUN: 40 mg/dL — ABNORMAL HIGH (ref 6–20)
CO2: 19 mmol/L — ABNORMAL LOW (ref 22–32)
Calcium: 10.3 mg/dL (ref 8.9–10.3)
Chloride: 93 mmol/L — ABNORMAL LOW (ref 98–111)
Creatinine, Ser: 1.43 mg/dL — ABNORMAL HIGH (ref 0.44–1.00)
GFR, Estimated: 50 mL/min — ABNORMAL LOW (ref >60)
Glucose, Bld: 441 mg/dL — ABNORMAL HIGH (ref 70–99)
Potassium: 5.6 mmol/L — ABNORMAL HIGH (ref 3.5–5.1)
Sodium: 127 mmol/L — ABNORMAL LOW (ref 135–145)
Total Bilirubin: 0.7 mg/dL (ref 0.3–1.2)
Total Protein: 8.4 g/dL — ABNORMAL HIGH (ref 6.5–8.1)

## 2022-10-18 LAB — HIV ANTIBODY (ROUTINE TESTING W REFLEX): HIV Screen 4th Generation wRfx: NONREACTIVE

## 2022-10-18 LAB — TRIGLYCERIDES: Triglycerides: 4892 mg/dL — ABNORMAL HIGH (ref <150)

## 2022-10-18 LAB — BETA-HYDROXYBUTYRIC ACID: Beta-Hydroxybutyric Acid: 0.15 mmol/L (ref 0.05–0.27)

## 2022-10-18 MED ORDER — DOXYCYCLINE HYCLATE 100 MG PO CAPS: 100.0000 mg | ORAL_CAPSULE | Freq: Two times a day (BID) | ORAL | 0 refills | Status: DC

## 2022-10-18 MED ORDER — IOHEXOL 350 MG/ML SOLN
100.0000 mL | Freq: Once | INTRAVENOUS | Status: AC | PRN
  Administered 2022-10-18: 60 mL via INTRAVENOUS

## 2022-10-18 NOTE — ED Notes (Signed)
Patient transported to CT 

## 2022-10-18 NOTE — ED Provider Notes (Signed)
Care of the patient assumed at shift change. Complex medical history. Here primarily for skin nodules consistent with folliculitis, also noted to be hyperglycemic, awaiting labs and CT imaging for acute on chronic abdominal pain.  Physical Exam  BP (!) 167/112   Pulse (!) 111   Temp 98.2 F (36.8 C) (Oral)   Resp 16   Ht 4\' 9"  (1.448 m)   Wt 60.3 kg   LMP 09/15/2022 (Exact Date)   SpO2 97%   BMI 28.78 kg/m   Physical Exam  Procedures  Procedures  ED Course / MDM   Clinical Course as of 10/18/22 0214  Tue Oct 18, 2022  0211 CMP with CKD and hyperglycemia without DKA. Triglycerides are markedly elevated, above baseline but not uncommon for her to get up this high. I personally viewed the images from radiology studies and agree with radiologist interpretation: CT neg for acute process.   Discussed patient's results, offered admission for management of hypertriglycerides and hyperglycemia, but she does not want to be admitted. [name] comfortable managing her sugars at home. She will contact her endocrinologist for outpatient management. Plan doxycycline for folliculitis. Otherwise she is eager to go home.  [CS]    Clinical Course User Index [CS] Pollyann Savoy, MD   Medical Decision Making Problems Addressed: Folliculitis: acute illness or injury Hyperglycemia: chronic illness or injury with exacerbation, progression, or side effects of treatment Hypertriglyceridemia: chronic illness or injury with exacerbation, progression, or side effects of treatment  Amount and/or Complexity of Data Reviewed Labs: ordered. Decision-making details documented in ED Course. Radiology: ordered and independent interpretation performed.  Risk Prescription drug management. Decision regarding hospitalization.          Pollyann Savoy, MD 10/18/22 [phone]

## 2022-10-19 ENCOUNTER — Telehealth: Payer: Self-pay | Admitting: Obstetrics and Gynecology

## 2022-10-19 NOTE — ED Notes (Signed)
Lab called on this patient and RPR sample is unable to be ran.  Dr Renaye Rakers who is on at [facility] today notified.

## 2022-10-19 NOTE — Transitions of Care (Post Inpatient/ED Visit) (Signed)
   10/19/2022  Name: Dezaree Tracey MRN: [id number] DOB: [birth year]  Today's TOC FU Call Status: Today's TOC FU Call Status:: Unsuccessul Call (1st Attempt) Unsuccessful Call (1st Attempt) Date: 10/19/22  Attempted to reach the patient regarding the most recent Inpatient/ED visit.  Follow Up Plan: Additional outreach attempts will be made to reach the patient to complete the Transitions of Care (Post Inpatient/ED visit) call.   Kathi Der RN, BSN [place]  [employer] - Managed Medicaid High Risk [phone]

## 2022-10-25 DIAGNOSIS — E1142 Type 2 diabetes mellitus with diabetic polyneuropathy: Secondary | ICD-10-CM | POA: Diagnosis not present

## 2022-10-25 DIAGNOSIS — G8114 Spastic hemiplegia affecting left nondominant side: Secondary | ICD-10-CM | POA: Diagnosis not present

## 2022-10-25 DIAGNOSIS — Z7689 Persons encountering health services in other specified circumstances: Secondary | ICD-10-CM | POA: Diagnosis not present

## 2022-10-25 DIAGNOSIS — G894 Chronic pain syndrome: Secondary | ICD-10-CM | POA: Diagnosis not present

## 2022-10-25 DIAGNOSIS — C719 Malignant neoplasm of brain, unspecified: Secondary | ICD-10-CM | POA: Diagnosis not present

## 2022-10-25 DIAGNOSIS — H02431 Paralytic ptosis of right eyelid: Secondary | ICD-10-CM | POA: Diagnosis not present

## 2022-10-27 DIAGNOSIS — Z419 Encounter for procedure for purposes other than remedying health state, unspecified: Secondary | ICD-10-CM | POA: Diagnosis not present

## 2022-11-03 DIAGNOSIS — Z7689 Persons encountering health services in other specified circumstances: Secondary | ICD-10-CM | POA: Diagnosis not present

## 2022-11-04 ENCOUNTER — Institutional Professional Consult (permissible substitution) (HOSPITAL_BASED_OUTPATIENT_CLINIC_OR_DEPARTMENT_OTHER): Payer: Medicaid Other | Admitting: Internal Medicine

## 2022-11-04 DIAGNOSIS — Z7689 Persons encountering health services in other specified circumstances: Secondary | ICD-10-CM | POA: Diagnosis not present

## 2022-11-08 DIAGNOSIS — R000 Tachycardia, unspecified: Secondary | ICD-10-CM | POA: Diagnosis not present

## 2022-11-08 DIAGNOSIS — Z111 Encounter for screening for respiratory tuberculosis: Secondary | ICD-10-CM | POA: Diagnosis not present

## 2022-11-08 DIAGNOSIS — D649 Anemia, unspecified: Secondary | ICD-10-CM | POA: Diagnosis not present

## 2022-11-08 DIAGNOSIS — S060X0A Concussion without loss of consciousness, initial encounter: Secondary | ICD-10-CM | POA: Diagnosis not present

## 2022-11-08 DIAGNOSIS — E1159 Type 2 diabetes mellitus with other circulatory complications: Secondary | ICD-10-CM | POA: Diagnosis not present

## 2022-11-08 DIAGNOSIS — K861 Other chronic pancreatitis: Secondary | ICD-10-CM | POA: Diagnosis not present

## 2022-11-08 DIAGNOSIS — D500 Iron deficiency anemia secondary to blood loss (chronic): Secondary | ICD-10-CM | POA: Diagnosis not present

## 2022-11-08 DIAGNOSIS — I152 Hypertension secondary to endocrine disorders: Secondary | ICD-10-CM | POA: Diagnosis not present

## 2022-11-08 DIAGNOSIS — R519 Headache, unspecified: Secondary | ICD-10-CM | POA: Diagnosis not present

## 2022-11-11 DIAGNOSIS — D500 Iron deficiency anemia secondary to blood loss (chronic): Secondary | ICD-10-CM | POA: Diagnosis not present

## 2022-11-11 DIAGNOSIS — Z85841 Personal history of malignant neoplasm of brain: Secondary | ICD-10-CM | POA: Diagnosis not present

## 2022-11-11 DIAGNOSIS — G43019 Migraine without aura, intractable, without status migrainosus: Secondary | ICD-10-CM | POA: Diagnosis not present

## 2022-11-11 DIAGNOSIS — Z7689 Persons encountering health services in other specified circumstances: Secondary | ICD-10-CM | POA: Diagnosis not present

## 2022-11-11 DIAGNOSIS — D649 Anemia, unspecified: Secondary | ICD-10-CM | POA: Diagnosis not present

## 2022-11-15 DIAGNOSIS — Z7689 Persons encountering health services in other specified circumstances: Secondary | ICD-10-CM | POA: Diagnosis not present

## 2022-11-15 DIAGNOSIS — D500 Iron deficiency anemia secondary to blood loss (chronic): Secondary | ICD-10-CM | POA: Diagnosis not present

## 2022-11-16 DIAGNOSIS — E1159 Type 2 diabetes mellitus with other circulatory complications: Secondary | ICD-10-CM | POA: Diagnosis not present

## 2022-11-16 DIAGNOSIS — I152 Hypertension secondary to endocrine disorders: Secondary | ICD-10-CM | POA: Diagnosis not present

## 2022-11-22 DIAGNOSIS — E113312 Type 2 diabetes mellitus with moderate nonproliferative diabetic retinopathy with macular edema, left eye: Secondary | ICD-10-CM | POA: Diagnosis not present

## 2022-11-22 DIAGNOSIS — D500 Iron deficiency anemia secondary to blood loss (chronic): Secondary | ICD-10-CM | POA: Diagnosis not present

## 2022-11-22 DIAGNOSIS — Z7689 Persons encountering health services in other specified circumstances: Secondary | ICD-10-CM | POA: Diagnosis not present

## 2022-11-24 DIAGNOSIS — J343 Hypertrophy of nasal turbinates: Secondary | ICD-10-CM | POA: Diagnosis not present

## 2022-11-24 DIAGNOSIS — J342 Deviated nasal septum: Secondary | ICD-10-CM | POA: Diagnosis not present

## 2022-11-24 DIAGNOSIS — H02431 Paralytic ptosis of right eyelid: Secondary | ICD-10-CM | POA: Diagnosis not present

## 2022-11-24 DIAGNOSIS — J320 Chronic maxillary sinusitis: Secondary | ICD-10-CM | POA: Diagnosis not present

## 2022-11-24 DIAGNOSIS — J301 Allergic rhinitis due to pollen: Secondary | ICD-10-CM | POA: Diagnosis not present

## 2022-11-24 DIAGNOSIS — I509 Heart failure, unspecified: Secondary | ICD-10-CM | POA: Diagnosis not present

## 2022-11-24 DIAGNOSIS — Z87898 Personal history of other specified conditions: Secondary | ICD-10-CM | POA: Diagnosis not present

## 2022-11-27 DIAGNOSIS — Z419 Encounter for procedure for purposes other than remedying health state, unspecified: Secondary | ICD-10-CM | POA: Diagnosis not present

## 2022-11-30 DIAGNOSIS — Z7689 Persons encountering health services in other specified circumstances: Secondary | ICD-10-CM | POA: Diagnosis not present

## 2022-11-30 DIAGNOSIS — G43909 Migraine, unspecified, not intractable, without status migrainosus: Secondary | ICD-10-CM | POA: Diagnosis not present

## 2022-12-01 ENCOUNTER — Encounter (HOSPITAL_BASED_OUTPATIENT_CLINIC_OR_DEPARTMENT_OTHER): Payer: Self-pay | Admitting: Internal Medicine

## 2022-12-01 ENCOUNTER — Ambulatory Visit (INDEPENDENT_AMBULATORY_CARE_PROVIDER_SITE_OTHER): Payer: Medicaid Other | Admitting: Internal Medicine

## 2022-12-01 VITALS — BP 130/90 | HR 119 | Ht <= 58 in | Wt 137.2 lb

## 2022-12-01 DIAGNOSIS — I5042 Chronic combined systolic (congestive) and diastolic (congestive) heart failure: Secondary | ICD-10-CM | POA: Diagnosis not present

## 2022-12-01 DIAGNOSIS — E781 Pure hyperglyceridemia: Secondary | ICD-10-CM

## 2022-12-01 DIAGNOSIS — R000 Tachycardia, unspecified: Secondary | ICD-10-CM

## 2022-12-01 DIAGNOSIS — N184 Chronic kidney disease, stage 4 (severe): Secondary | ICD-10-CM

## 2022-12-01 MED ORDER — VASCEPA 1 G PO CAPS: 2.0000 g | ORAL_CAPSULE | Freq: Two times a day (BID) | ORAL | 11 refills | Status: DC

## 2022-12-01 MED ORDER — FENOFIBRATE 54 MG PO TABS: 54.0000 mg | ORAL_TABLET | Freq: Every day | ORAL | 11 refills | Status: DC

## 2022-12-01 NOTE — Progress Notes (Signed)
LIPID CLINIC CONSULT NOTE  Chief Complaint:  Elevated triglycerides  Primary Care Physician: Nathaneil Canary, PA-C  Primary Cardiologist:  None  HPI:  Jennifer [name] is a 32 y.o. female who is being seen today for the evaluation of elevated triglycerides at the request of [place], Anderson Malta, FNP.  This is a medically complex 32 year old female kindly referred for evaluation of hypertriglyceridemia.  This is a longstanding issue initially thought to be due to lipoprotein lipase deficiency however she is cared for by [facility] endocrinology and was seen by a [facility] pediatric geneticist who carried out whole exome gene sequencing that failed to identify a cause of her hypertriglyceridemia.  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  She has subsequently been maintained on fenofibrate 160 mg daily.  Recent review of her lab work, however shows that she does have a history of chronic kidney disease which was significant in July with a creatinine of 2.62 however improved to 1.43 more recently.  This with estimated GFR less than 50, however.  [name] not on any additional triglyceride lowering therapies.  PMHx:  Past Medical History:  Diagnosis Date   Afib (HCC) 05/12/2021   Brain tumor (HCC) 03/29/1995   astrocytoma   CHF (congestive heart failure) (HCC)    Cholesterosis    CKD (chronic kidney disease) stage 4, GFR 15-29 ml/min (HCC) 05/13/2021   DM (diabetes mellitus) (HCC) 10/10/2018   Fatty liver    HTN (hypertension) 10/10/2018   Hypothyroidism 10/10/2018   Lipoprotein deficiency    Lung disease    longevity long term   Pancreatitis    Polycystic ovary syndrome     Past Surgical History:  Procedure Laterality Date   ABDOMINAL SURGERY     pt states during miscarriage got her intestine   BRAIN SURGERY     EYE MUSCLE SURGERY Right 03/28/2014   PRESSURE SENSOR/CARDIOMEMS N/A 12/06/2021   Procedure: PRESSURE  SENSOR/CARDIOMEMS;  Surgeon: Dolores Patty, MD;  Location: [facility] INVASIVE CV LAB;  Service: Cardiovascular;  Laterality: N/A;   RIGHT HEART CATH N/A 08/05/2021   Procedure: RIGHT HEART CATH;  Surgeon: Dolores Patty, MD;  Location: [facility] INVASIVE CV LAB;  Service: Cardiovascular;  Laterality: N/A;   RIGHT HEART CATH N/A 12/06/2021   Procedure: RIGHT HEART CATH;  Surgeon: Dolores Patty, MD;  Location: [facility] INVASIVE CV LAB;  Service: Cardiovascular;  Laterality: N/A;   RIGHT/LEFT HEART CATH AND CORONARY ANGIOGRAPHY N/A 12/03/2020   Procedure: RIGHT/LEFT HEART CATH AND CORONARY ANGIOGRAPHY;  Surgeon: Yates Decamp, MD;  Location: [facility] INVASIVE CV LAB;  Service: Cardiovascular;  Laterality: N/A;   VENTRICULOSTOMY  03/28/1997    FAMHx:  Family History  Problem Relation Age of Onset   Diabetes Mother    Hypertension Mother    Hyperlipidemia Mother    Thyroid disease Mother    Hypertension Father    Diabetes Father    Pancreatic cancer Paternal Aunt    Pancreatic cancer Paternal Uncle    Colon cancer Neg Hx    Esophageal cancer Neg Hx    Stomach cancer Neg Hx     SOCHx:   reports that she has never smoked. She has never used smokeless tobacco. She reports that she does not drink alcohol and does not use drugs.  ALLERGIES:  Allergies  Allergen Reactions   Ketamine Other (See Comments)    In vegetative state for 15 minutes per pt  Erythromycin     unk   Fentanyl Nausea And Vomiting   Maitake Mushroom [Maitake] Itching    Itchy throat   Penicillin V Itching    Gi upset   Shellfish Allergy Other (See Comments)    Pt has never had shellfish but tested positive on allergy test. Pt states contrast in CT is okay   Morphine Itching and Rash   Penicillins Rash    Has patient had a PCN reaction causing immediate rash, facial/tongue/throat swelling, SOB or lightheadedness with hypotension: Y Has patient had a PCN reaction causing severe rash involving mucus membranes or skin necrosis:  Y Has patient had a PCN reaction that required hospitalization: N Has patient had a PCN reaction occurring within the last 10 years: Y If all of the above answers are "NO", then may proceed with Cephalosporin use.    Prednisone Rash    ROS: Pertinent items noted in HPI and remainder of comprehensive ROS otherwise negative.  HOME MEDS: Current Outpatient Medications on File Prior to Visit  Medication Sig Dispense Refill   albuterol (VENTOLIN HFA) 108 (90 Base) MCG/ACT inhaler Inhale 1-2 puffs into the lungs every 6 (six) hours as needed for wheezing or shortness of breath.     aspirin EC 81 MG EC tablet Take 1 tablet (81 mg total) by mouth daily. Swallow whole. 30 tablet 1   Azelastine HCl 137 MCG/SPRAY SOLN Place 1 spray into the nose daily.     butalbital-acetaminophen-caffeine (FIORICET) 50-325-40 MG tablet Take 1 tablet by mouth in the morning and at bedtime.     carvedilol (COREG) 3.125 MG tablet Take 1 tablet (3.125 mg total) by mouth 2 (two) times daily. 60 tablet 11   CREON 36000-114000 units CPEP capsule Take 36,000 Units by mouth 3 (three) times daily.     dicyclomine (BENTYL) 20 MG tablet Take 1 tablet (20 mg total) by mouth 2 (two) times daily. 20 tablet 0   doxycycline (VIBRAMYCIN) 100 MG capsule Take 1 capsule (100 mg total) by mouth 2 (two) times daily. 20 capsule 0   DULoxetine (CYMBALTA) 60 MG capsule Take by mouth.     ergocalciferol (VITAMIN D2) 1.25 MG (50000 UT) capsule Take 50,000 Units by mouth every 7 (seven) days.     Evolocumab (REPATHA) 140 MG/ML SOSY Inject 140 mg into the skin See admin instructions. Inject 140 mg subcutaneously every other Monday evening     fenofibrate 160 MG tablet Take 1 tablet (160 mg total) by mouth daily. 30 tablet 1   hydrALAZINE (APRESOLINE) 50 MG tablet Take 50 mg by mouth 3 (three) times daily.     Insulin Pen Needle 32G X 4 MM MISC 1 Device by Does not apply route QID. For use with insulin pens 100 each 0   ivabradine (CORLANOR) 7.5  MG TABS tablet Take 1 tablet (7.5 mg total) by mouth 2 (two) times daily with a meal. 60 tablet 6   levocetirizine (XYZAL) 5 MG tablet Take 5 mg by mouth daily.     levothyroxine (SYNTHROID) 137 MCG tablet Take 137 mcg by mouth daily.     pantoprazole (PROTONIX) 40 MG tablet Take 40 mg by mouth daily.     pregabalin (LYRICA) 300 MG capsule Take 300 mg by mouth at bedtime.     prochlorperazine (COMPAZINE) 5 MG tablet Take 5 mg by mouth at bedtime.     rosuvastatin (CRESTOR) 40 MG tablet Take 40 mg by mouth daily.     sodium bicarbonate 650 MG  tablet Take 650 mg by mouth 2 (two) times daily.     spironolactone (ALDACTONE) 25 MG tablet Take 25 mg by mouth daily.     torsemide (DEMADEX) 20 MG tablet Take 2 tablets (40 mg total) by mouth daily. May take 1 additional tablet as needed 90 tablet 2   tretinoin (RETIN-A) 0.05 % cream Apply 1 application topically at bedtime.     insulin regular human CONCENTRATED (HUMULIN R U-500 KWIKPEN) 500 UNIT/ML KwikPen Inject 185 Units into the skin 3 (three) times daily with meals. 33.3 mL 0   No current facility-administered medications on file prior to visit.    LABS/IMAGING: No results found for this or any previous visit (from the past 48 hour(s)). No results found.  LIPID PANEL:    Component Value Date/Time   CHOL 333 (H) 10/04/2022 1215   TRIG 4,892 (H) 10/17/2022 2130   HDL NOT REPORTED DUE TO HIGH TRIGLYCERIDES 10/04/2022 1215   CHOLHDL NOT REPORTED DUE TO HIGH TRIGLYCERIDES 10/04/2022 1215   VLDL UNABLE TO CALCULATE IF TRIGLYCERIDE OVER 400 mg/dL 01/22/2535 6440   LDLCALC UNABLE TO CALCULATE IF TRIGLYCERIDE OVER 400 mg/dL 34/74/2595 6387   LDLDIRECT 44 10/04/2022 1215    WEIGHTS: Wt Readings from Last 3 Encounters:  12/01/22 137 lb 3.2 oz (62.2 kg)  10/13/22 133 lb (60.3 kg)  10/05/22 132 lb (59.9 kg)    VITALS: BP (!) 130/90 (BP Location: Right Arm, Patient Position: Sitting, Cuff Size: Normal)   Pulse (!) 119   Ht 4\' 9"  (1.448 m)    Wt 137 lb 3.2 oz (62.2 kg)   SpO2 96%   BMI 29.69 kg/m   EXAM: Deferred  EKG: EKG Interpretation Date/Time:  Thursday December 01 2022 13:53:53 EDT Ventricular Rate:  119 PR Interval:  120 QRS Duration:  82 QT Interval:  346 QTC Calculation: 486 R Axis:   2  Text Interpretation: Sinus tachycardia Left ventricular hypertrophy with repolarization abnormality ( R in aVL , Cornell product ) When compared with ECG of 19-Sep-2022 01:26, the heartrate is faster Confirmed by Zoila Shutter [phone]) on 12/01/2022 2:07:17 PM    ASSESSMENT: Hyperchylomicronemia, 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   Ms. Edlin has had significantly elevated triglycerides, reportedly over 10,000 in the past and has had pancreatitis.  She has minimal coronary disease and primarily nonischemic cardiomyopathy.  I suspect she has MCS.  She also has some degree of chronic kidney disease although creatinine improved recently.  GFR still less than 50 and I would advise decreasing her fenofibrate to 54 mg daily, which is the appropriate dose reduction for her renal function.  Higher doses of fenofibrate can be associated with elevations in creatinine.  In addition we will plan to start brand-name Vascepa 2 g twice daily which she said she had been on that in the past but stopped it for unknown reasons, however she did tolerate it well.  Beyond that, she is also not on statin therapy which is considered guideline recommended.  We also participated recently in clinical trials to evaluate an APO C3 inhibitor.  Further trials similar to that may become available and I will reach out to her if there are options she may qualify for.  Thanks again for the kind referral. Follow-up with me in 3-4  months.  [name], MD,  [facility]  [facility]  Medical Director of the [facility] Lipid Disorders &  Cardiovascular Risk Reduction Clinic Diplomate of the American Board of Clinical Lipidology Attending Cardiologist  Direct Dial: [phone]  Fax: [phone]  Website:  [email or web address] Nicely Byrdie Miyazaki 12/01/2022, 2:07 PM

## 2022-12-01 NOTE — Patient Instructions (Signed)
Medication Instructions:  Dr. Rennis Golden has prescribed BRAND-NAME vascepa 2 capsules twice daily  Dr. Rennis Golden has decreased the dose of fenofibrate to 54mg  daily  *If you need a refill on your cardiac medications before your next appointment, please call your pharmacy*   Lab Work: FASTING NMR lipoprofile in 3-4 months   If you have labs (blood work) drawn today and your tests are completely normal, you will receive your results only by: MyChart Message (if you have MyChart) OR A paper copy in the mail If you have any lab test that is abnormal or we need to change your treatment, we will call you to review the results.    Follow-Up: At [facility], you and your health needs are our priority.  As part of our continuing mission to provide you with exceptional heart care, we have created designated Provider Care Teams.  These Care Teams include your primary Cardiologist (physician) and Advanced Practice Providers (APPs -  Physician Assistants and Nurse Practitioners) who all work together to provide you with the care you need, when you need it.  We recommend signing up for the patient portal called "MyChart".  Sign up information is provided on this After Visit Summary.  MyChart is used to connect with patients for Virtual Visits (Telemedicine).  Patients are able to view lab/test results, encounter notes, upcoming appointments, etc.  Non-urgent messages can be sent to your provider as well.   To learn more about what you can do with MyChart, go to [email or web address].    Your next appointment:    3-4 months with Dr. Rennis Golden

## 2022-12-02 ENCOUNTER — Other Ambulatory Visit (HOSPITAL_COMMUNITY): Payer: Self-pay

## 2022-12-02 ENCOUNTER — Telehealth: Payer: Self-pay | Admitting: Pharmacy Technician

## 2022-12-02 NOTE — Telephone Encounter (Signed)
Pharmacy Patient Advocate Encounter   Received notification from Fax that prior authorization for fenofibrate is required/requested.   Insurance verification completed.   The patient is insured through [facility] [place] .   Per test claim: PA required; PA started via CoverMyMeds. KEY [id number] . Waiting for clinical questions to populate.

## 2022-12-02 NOTE — Telephone Encounter (Signed)
Pharmacy Patient Advocate Encounter   Received notification from Fax that prior authorization for fenofibrate is required/requested.   Insurance verification completed.   The patient is insured through [facility] .   Per test claim: PA required; PA submitted to [facility] Medicaid via CoverMyMeds Key/confirmation #/EOC [id number] Status is pending

## 2022-12-05 ENCOUNTER — Other Ambulatory Visit (HOSPITAL_COMMUNITY): Payer: Self-pay

## 2022-12-06 ENCOUNTER — Other Ambulatory Visit (HOSPITAL_COMMUNITY): Payer: Self-pay

## 2022-12-07 ENCOUNTER — Other Ambulatory Visit (HOSPITAL_COMMUNITY): Payer: Self-pay

## 2022-12-08 ENCOUNTER — Other Ambulatory Visit (HOSPITAL_COMMUNITY): Payer: Self-pay

## 2022-12-09 ENCOUNTER — Other Ambulatory Visit (HOSPITAL_COMMUNITY): Payer: Self-pay

## 2022-12-12 ENCOUNTER — Other Ambulatory Visit (HOSPITAL_COMMUNITY): Payer: Self-pay

## 2022-12-13 ENCOUNTER — Other Ambulatory Visit (HOSPITAL_COMMUNITY): Payer: Self-pay

## 2022-12-14 ENCOUNTER — Other Ambulatory Visit (HOSPITAL_COMMUNITY): Payer: Self-pay

## 2022-12-19 DIAGNOSIS — E039 Hypothyroidism, unspecified: Secondary | ICD-10-CM | POA: Diagnosis not present

## 2022-12-19 DIAGNOSIS — E781 Pure hyperglyceridemia: Secondary | ICD-10-CM | POA: Diagnosis not present

## 2022-12-19 DIAGNOSIS — E782 Mixed hyperlipidemia: Secondary | ICD-10-CM | POA: Diagnosis not present

## 2022-12-19 DIAGNOSIS — I2510 Atherosclerotic heart disease of native coronary artery without angina pectoris: Secondary | ICD-10-CM | POA: Diagnosis not present

## 2022-12-19 DIAGNOSIS — E786 Lipoprotein deficiency: Secondary | ICD-10-CM | POA: Diagnosis not present

## 2022-12-19 DIAGNOSIS — R7989 Other specified abnormal findings of blood chemistry: Secondary | ICD-10-CM | POA: Diagnosis not present

## 2022-12-19 DIAGNOSIS — I428 Other cardiomyopathies: Secondary | ICD-10-CM | POA: Diagnosis not present

## 2022-12-19 DIAGNOSIS — E139 Other specified diabetes mellitus without complications: Secondary | ICD-10-CM | POA: Diagnosis not present

## 2022-12-19 DIAGNOSIS — N183 Chronic kidney disease, stage 3 unspecified: Secondary | ICD-10-CM | POA: Diagnosis not present

## 2022-12-20 ENCOUNTER — Other Ambulatory Visit (HOSPITAL_COMMUNITY): Payer: Self-pay

## 2022-12-21 ENCOUNTER — Other Ambulatory Visit (HOSPITAL_COMMUNITY): Payer: Self-pay

## 2022-12-21 DIAGNOSIS — H34811 Central retinal vein occlusion, right eye, with macular edema: Secondary | ICD-10-CM | POA: Diagnosis not present

## 2022-12-21 DIAGNOSIS — Z7689 Persons encountering health services in other specified circumstances: Secondary | ICD-10-CM | POA: Diagnosis not present

## 2022-12-22 ENCOUNTER — Other Ambulatory Visit (HOSPITAL_COMMUNITY): Payer: Self-pay

## 2022-12-23 ENCOUNTER — Other Ambulatory Visit (HOSPITAL_COMMUNITY): Payer: Self-pay

## 2022-12-26 ENCOUNTER — Other Ambulatory Visit (HOSPITAL_COMMUNITY): Payer: Self-pay

## 2022-12-27 ENCOUNTER — Other Ambulatory Visit (HOSPITAL_COMMUNITY): Payer: Self-pay

## 2022-12-27 DIAGNOSIS — Z419 Encounter for procedure for purposes other than remedying health state, unspecified: Secondary | ICD-10-CM | POA: Diagnosis not present

## 2022-12-27 DIAGNOSIS — N3000 Acute cystitis without hematuria: Secondary | ICD-10-CM | POA: Diagnosis not present

## 2022-12-27 DIAGNOSIS — Z7689 Persons encountering health services in other specified circumstances: Secondary | ICD-10-CM | POA: Diagnosis not present

## 2022-12-28 ENCOUNTER — Other Ambulatory Visit (HOSPITAL_COMMUNITY): Payer: Self-pay

## 2022-12-30 ENCOUNTER — Other Ambulatory Visit (HOSPITAL_COMMUNITY): Payer: Self-pay

## 2023-01-02 ENCOUNTER — Inpatient Hospital Stay (HOSPITAL_COMMUNITY): Payer: Medicaid Other

## 2023-01-02 ENCOUNTER — Encounter (HOSPITAL_COMMUNITY): Payer: Self-pay

## 2023-01-02 ENCOUNTER — Inpatient Hospital Stay (HOSPITAL_COMMUNITY)
Admission: EM | Admit: 2023-01-02 | Discharge: 2023-01-04 | DRG: 392 | Disposition: A | Discharge status: Home / Self Care | Payer: Medicaid Other | Attending: Family Medicine | Admitting: Family Medicine

## 2023-01-02 ENCOUNTER — Emergency Department (HOSPITAL_COMMUNITY): Payer: Medicaid Other

## 2023-01-02 DIAGNOSIS — M25519 Pain in unspecified shoulder: Secondary | ICD-10-CM | POA: Diagnosis not present

## 2023-01-02 DIAGNOSIS — Z881 Allergy status to other antibiotic agents status: Secondary | ICD-10-CM

## 2023-01-02 DIAGNOSIS — Z79890 Hormone replacement therapy (postmenopausal): Secondary | ICD-10-CM | POA: Diagnosis not present

## 2023-01-02 DIAGNOSIS — N184 Chronic kidney disease, stage 4 (severe): Secondary | ICD-10-CM | POA: Diagnosis not present

## 2023-01-02 DIAGNOSIS — R55 Syncope and collapse: Secondary | ICD-10-CM

## 2023-01-02 DIAGNOSIS — Z794 Long term (current) use of insulin: Secondary | ICD-10-CM | POA: Diagnosis not present

## 2023-01-02 DIAGNOSIS — N390 Urinary tract infection, site not specified: Secondary | ICD-10-CM | POA: Diagnosis present

## 2023-01-02 DIAGNOSIS — D496 Neoplasm of unspecified behavior of brain: Secondary | ICD-10-CM | POA: Diagnosis present

## 2023-01-02 DIAGNOSIS — E861 Hypovolemia: Secondary | ICD-10-CM | POA: Diagnosis not present

## 2023-01-02 DIAGNOSIS — E860 Dehydration: Secondary | ICD-10-CM | POA: Diagnosis not present

## 2023-01-02 DIAGNOSIS — E1165 Type 2 diabetes mellitus with hyperglycemia: Secondary | ICD-10-CM | POA: Diagnosis present

## 2023-01-02 DIAGNOSIS — K760 Fatty (change of) liver, not elsewhere classified: Secondary | ICD-10-CM | POA: Diagnosis present

## 2023-01-02 DIAGNOSIS — S0101XA Laceration without foreign body of scalp, initial encounter: Secondary | ICD-10-CM | POA: Diagnosis not present

## 2023-01-02 DIAGNOSIS — R748 Abnormal levels of other serum enzymes: Secondary | ICD-10-CM

## 2023-01-02 DIAGNOSIS — Z888 Allergy status to other drugs, medicaments and biological substances status: Secondary | ICD-10-CM

## 2023-01-02 DIAGNOSIS — Z7982 Long term (current) use of aspirin: Secondary | ICD-10-CM

## 2023-01-02 DIAGNOSIS — S7001XA Contusion of right hip, initial encounter: Secondary | ICD-10-CM | POA: Diagnosis not present

## 2023-01-02 DIAGNOSIS — Z1152 Encounter for screening for COVID-19: Secondary | ICD-10-CM | POA: Diagnosis not present

## 2023-01-02 DIAGNOSIS — R1013 Epigastric pain: Secondary | ICD-10-CM | POA: Diagnosis not present

## 2023-01-02 DIAGNOSIS — Z800 Family history of malignant neoplasm of digestive organs: Secondary | ICD-10-CM

## 2023-01-02 DIAGNOSIS — I10 Essential (primary) hypertension: Secondary | ICD-10-CM | POA: Diagnosis not present

## 2023-01-02 DIAGNOSIS — I4891 Unspecified atrial fibrillation: Secondary | ICD-10-CM | POA: Diagnosis not present

## 2023-01-02 DIAGNOSIS — N3289 Other specified disorders of bladder: Secondary | ICD-10-CM

## 2023-01-02 DIAGNOSIS — I509 Heart failure, unspecified: Secondary | ICD-10-CM | POA: Diagnosis present

## 2023-01-02 DIAGNOSIS — E119 Type 2 diabetes mellitus without complications: Secondary | ICD-10-CM | POA: Diagnosis not present

## 2023-01-02 DIAGNOSIS — E781 Pure hyperglyceridemia: Secondary | ICD-10-CM | POA: Diagnosis present

## 2023-01-02 DIAGNOSIS — Z91013 Allergy to seafood: Secondary | ICD-10-CM

## 2023-01-02 DIAGNOSIS — R531 Weakness: Secondary | ICD-10-CM | POA: Diagnosis present

## 2023-01-02 DIAGNOSIS — Z8249 Family history of ischemic heart disease and other diseases of the circulatory system: Secondary | ICD-10-CM

## 2023-01-02 DIAGNOSIS — Z8349 Family history of other endocrine, nutritional and metabolic diseases: Secondary | ICD-10-CM

## 2023-01-02 DIAGNOSIS — E039 Hypothyroidism, unspecified: Secondary | ICD-10-CM | POA: Diagnosis not present

## 2023-01-02 DIAGNOSIS — K861 Other chronic pancreatitis: Secondary | ICD-10-CM | POA: Diagnosis present

## 2023-01-02 DIAGNOSIS — Z833 Family history of diabetes mellitus: Secondary | ICD-10-CM

## 2023-01-02 DIAGNOSIS — I130 Hypertensive heart and chronic kidney disease with heart failure and stage 1 through stage 4 chronic kidney disease, or unspecified chronic kidney disease: Secondary | ICD-10-CM | POA: Diagnosis not present

## 2023-01-02 DIAGNOSIS — E872 Acidosis, unspecified: Secondary | ICD-10-CM | POA: Diagnosis not present

## 2023-01-02 DIAGNOSIS — D649 Anemia, unspecified: Secondary | ICD-10-CM | POA: Diagnosis present

## 2023-01-02 DIAGNOSIS — R109 Unspecified abdominal pain: Secondary | ICD-10-CM | POA: Diagnosis not present

## 2023-01-02 DIAGNOSIS — Y93K1 Activity, walking an animal: Secondary | ICD-10-CM | POA: Diagnosis not present

## 2023-01-02 DIAGNOSIS — E871 Hypo-osmolality and hyponatremia: Secondary | ICD-10-CM | POA: Diagnosis present

## 2023-01-02 DIAGNOSIS — Z79899 Other long term (current) drug therapy: Secondary | ICD-10-CM | POA: Diagnosis not present

## 2023-01-02 DIAGNOSIS — R739 Hyperglycemia, unspecified: Secondary | ICD-10-CM | POA: Diagnosis not present

## 2023-01-02 DIAGNOSIS — K529 Noninfective gastroenteritis and colitis, unspecified: Secondary | ICD-10-CM | POA: Diagnosis not present

## 2023-01-02 DIAGNOSIS — E1122 Type 2 diabetes mellitus with diabetic chronic kidney disease: Secondary | ICD-10-CM | POA: Diagnosis not present

## 2023-01-02 DIAGNOSIS — W108XXA Fall (on) (from) other stairs and steps, initial encounter: Secondary | ICD-10-CM | POA: Diagnosis not present

## 2023-01-02 DIAGNOSIS — Z880 Allergy status to penicillin: Secondary | ICD-10-CM

## 2023-01-02 DIAGNOSIS — R160 Hepatomegaly, not elsewhere classified: Secondary | ICD-10-CM | POA: Diagnosis not present

## 2023-01-02 DIAGNOSIS — R112 Nausea with vomiting, unspecified: Secondary | ICD-10-CM | POA: Diagnosis not present

## 2023-01-02 DIAGNOSIS — R7989 Other specified abnormal findings of blood chemistry: Secondary | ICD-10-CM | POA: Diagnosis not present

## 2023-01-02 DIAGNOSIS — S0990XA Unspecified injury of head, initial encounter: Secondary | ICD-10-CM | POA: Diagnosis present

## 2023-01-02 DIAGNOSIS — R197 Diarrhea, unspecified: Secondary | ICD-10-CM | POA: Diagnosis not present

## 2023-01-02 DIAGNOSIS — Z85841 Personal history of malignant neoplasm of brain: Secondary | ICD-10-CM

## 2023-01-02 DIAGNOSIS — E7801 Familial hypercholesterolemia: Secondary | ICD-10-CM

## 2023-01-02 DIAGNOSIS — Z83438 Family history of other disorder of lipoprotein metabolism and other lipidemia: Secondary | ICD-10-CM | POA: Diagnosis not present

## 2023-01-02 DIAGNOSIS — R162 Hepatomegaly with splenomegaly, not elsewhere classified: Secondary | ICD-10-CM | POA: Diagnosis not present

## 2023-01-02 DIAGNOSIS — Z885 Allergy status to narcotic agent status: Secondary | ICD-10-CM

## 2023-01-02 DIAGNOSIS — E875 Hyperkalemia: Secondary | ICD-10-CM | POA: Diagnosis present

## 2023-01-02 DIAGNOSIS — N189 Chronic kidney disease, unspecified: Secondary | ICD-10-CM | POA: Diagnosis not present

## 2023-01-02 DIAGNOSIS — Z789 Other specified health status: Secondary | ICD-10-CM | POA: Diagnosis not present

## 2023-01-02 LAB — LIPID PANEL
Cholesterol: 911 mg/dL — ABNORMAL HIGH (ref 0–200)
HDL: 30 mg/dL — ABNORMAL LOW (ref >40)
LDL Cholesterol: UNDETERMINED mg/dL (ref 0–99)
Triglycerides: >5000 mg/dL — ABNORMAL HIGH (ref <150)
VLDL: UNDETERMINED mg/dL (ref 0–40)

## 2023-01-02 LAB — CBC
HCT: 30.9 % — ABNORMAL LOW (ref 36.0–46.0)
Hemoglobin: 11.4 g/dL — ABNORMAL LOW (ref 12.0–15.0)
MCH: 34.1 pg — ABNORMAL HIGH (ref 26.0–34.0)
MCHC: 36.9 g/dL — ABNORMAL HIGH (ref 30.0–36.0)
MCV: 92.5 fL (ref 80.0–100.0)
Platelets: 142 10*3/uL — ABNORMAL LOW (ref 150–400)
RBC: 3.34 MIL/uL — ABNORMAL LOW (ref 3.87–5.11)
RDW: 17.2 % — ABNORMAL HIGH (ref 11.5–15.5)
WBC: 7.8 10*3/uL (ref 4.0–10.5)
nRBC: 0 % (ref 0.0–0.2)

## 2023-01-02 LAB — CBC WITH DIFFERENTIAL/PLATELET
Abs Immature Granulocytes: 0.06 K/uL (ref 0.00–0.07)
Basophils Absolute: 0.1 K/uL (ref 0.0–0.1)
Basophils Relative: 1 %
Eosinophils Absolute: 0.1 K/uL (ref 0.0–0.5)
Eosinophils Relative: 2 %
HCT: 35.7 % — ABNORMAL LOW (ref 36.0–46.0)
Hemoglobin: 11.8 g/dL — ABNORMAL LOW (ref 12.0–15.0)
Immature Granulocytes: 1 %
Lymphocytes Relative: 32 %
Lymphs Abs: 2.6 K/uL (ref 0.7–4.0)
MCH: 30.5 pg (ref 26.0–34.0)
MCHC: 33.1 g/dL (ref 30.0–36.0)
MCV: 92.2 fL (ref 80.0–100.0)
Monocytes Absolute: 0.5 K/uL (ref 0.1–1.0)
Monocytes Relative: 6 %
Neutro Abs: 4.8 K/uL (ref 1.7–7.7)
Neutrophils Relative %: 58 %
Platelets: 139 K/uL — ABNORMAL LOW (ref 150–400)
RBC: 3.87 MIL/uL (ref 3.87–5.11)
RDW: 17.2 % — ABNORMAL HIGH (ref 11.5–15.5)
WBC: 8.1 K/uL (ref 4.0–10.5)
nRBC: 0.4 % — ABNORMAL HIGH (ref 0.0–0.2)

## 2023-01-02 LAB — URINALYSIS, COMPLETE (UACMP) WITH MICROSCOPIC
Bacteria, UA: NONE SEEN
Bilirubin Urine: NEGATIVE
Glucose, UA: 50 mg/dL — AB
Ketones, ur: NEGATIVE mg/dL
Nitrite: NEGATIVE
Protein, ur: 100 mg/dL — AB
Specific Gravity, Urine: 1.032 — ABNORMAL HIGH (ref 1.005–1.030)
pH: 5 (ref 5.0–8.0)

## 2023-01-02 LAB — HEMOGLOBIN A1C
Hgb A1c MFr Bld: 6.9 % — ABNORMAL HIGH (ref 4.8–5.6)
Mean Plasma Glucose: 151.33 mg/dL

## 2023-01-02 LAB — I-STAT CHEM 8, ED
BUN: 43 mg/dL — ABNORMAL HIGH (ref 6–20)
Calcium, Ion: 1.21 mmol/L (ref 1.15–1.40)
Chloride: 113 mmol/L — ABNORMAL HIGH (ref 98–111)
Creatinine, Ser: 1.5 mg/dL — ABNORMAL HIGH (ref 0.44–1.00)
Glucose, Bld: 351 mg/dL — ABNORMAL HIGH (ref 70–99)
HCT: 33 % — ABNORMAL LOW (ref 36.0–46.0)
Hemoglobin: 11.2 g/dL — ABNORMAL LOW (ref 12.0–15.0)
Potassium: 6.4 mmol/L (ref 3.5–5.1)
Sodium: 134 mmol/L — ABNORMAL LOW (ref 135–145)
TCO2: 20 mmol/L — ABNORMAL LOW (ref 22–32)

## 2023-01-02 LAB — COMPREHENSIVE METABOLIC PANEL WITH GFR
ALT: 127 U/L — ABNORMAL HIGH (ref 0–44)
AST: 101 U/L — ABNORMAL HIGH (ref 15–41)
Albumin: 4.5 g/dL (ref 3.5–5.0)
Alkaline Phosphatase: 182 U/L — ABNORMAL HIGH (ref 38–126)
Anion gap: 17 — ABNORMAL HIGH (ref 5–15)
BUN: 36 mg/dL — ABNORMAL HIGH (ref 6–20)
CO2: 15 mmol/L — ABNORMAL LOW (ref 22–32)
Calcium: 9.7 mg/dL (ref 8.9–10.3)
Chloride: 103 mmol/L (ref 98–111)
Creatinine, Ser: 1.44 mg/dL — ABNORMAL HIGH (ref 0.44–1.00)
GFR, Estimated: 50 mL/min — ABNORMAL LOW
Glucose, Bld: 394 mg/dL — ABNORMAL HIGH (ref 70–99)
Potassium: 6.2 mmol/L — ABNORMAL HIGH (ref 3.5–5.1)
Sodium: 135 mmol/L (ref 135–145)
Total Bilirubin: 1.7 mg/dL — ABNORMAL HIGH (ref 0.3–1.2)

## 2023-01-02 LAB — CBG MONITORING, ED
Glucose-Capillary: 368 mg/dL — ABNORMAL HIGH (ref 70–99)
Glucose-Capillary: 206 mg/dL — ABNORMAL HIGH (ref 70–99)
Glucose-Capillary: 336 mg/dL — ABNORMAL HIGH (ref 70–99)

## 2023-01-02 LAB — BLOOD GAS, VENOUS
Acid-base deficit: 5.3 mmol/L — ABNORMAL HIGH (ref 0.0–2.0)
Bicarbonate: 20.6 mmol/L (ref 20.0–28.0)
O2 Saturation: 70.1 %
Patient temperature: 37
pCO2, Ven: 41 mmHg — ABNORMAL LOW (ref 44–60)
pH, Ven: 7.31 (ref 7.25–7.43)
pO2, Ven: 42 mmHg (ref 32–45)

## 2023-01-02 LAB — LIPASE, BLOOD: Lipase: 25 U/L (ref 11–51)

## 2023-01-02 LAB — SARS CORONAVIRUS 2 BY RT PCR: SARS Coronavirus 2 by RT PCR: NEGATIVE

## 2023-01-02 LAB — HCG, QUANTITATIVE, PREGNANCY: hCG, Beta Chain, Quant, S: <1 m[IU]/mL (ref <5)

## 2023-01-02 MED ORDER — EVOLOCUMAB 140 MG/ML ~~LOC~~ SOSY: 140.0000 mg | PREFILLED_SYRINGE | SUBCUTANEOUS | Status: DC

## 2023-01-02 MED ORDER — IOHEXOL 300 MG/ML  SOLN
100.0000 mL | Freq: Once | INTRAMUSCULAR | Status: AC | PRN
  Administered 2023-01-02: 80 mL via INTRAVENOUS

## 2023-01-02 MED ORDER — ONDANSETRON HCL 4 MG/2ML IJ SOLN
4.0000 mg | Freq: Once | INTRAMUSCULAR | Status: AC
  Administered 2023-01-02: 4 mg via INTRAVENOUS
  Filled 2023-01-02: qty 2

## 2023-01-02 MED ORDER — SODIUM CHLORIDE 0.9 % IV BOLUS
1000.0000 mL | Freq: Once | INTRAVENOUS | Status: AC
  Administered 2023-01-02: 1000 mL via INTRAVENOUS

## 2023-01-02 MED ORDER — INSULIN ASPART 100 UNIT/ML IV SOLN
10.0000 [IU] | Freq: Once | INTRAVENOUS | Status: AC
  Administered 2023-01-02: 10 [IU] via INTRAVENOUS
  Filled 2023-01-02: qty 0.1

## 2023-01-02 MED ORDER — SODIUM BICARBONATE 8.4 % IV SOLN
50.0000 meq | Freq: Once | INTRAVENOUS | Status: AC
  Administered 2023-01-02: 50 meq via INTRAVENOUS
  Filled 2023-01-02: qty 50

## 2023-01-02 MED ORDER — DIPHENHYDRAMINE HCL 50 MG/ML IJ SOLN
25.0000 mg | Freq: Once | INTRAMUSCULAR | Status: AC
  Administered 2023-01-02: 25 mg via INTRAVENOUS
  Filled 2023-01-02: qty 1

## 2023-01-02 MED ORDER — METOCLOPRAMIDE HCL 5 MG/ML IJ SOLN
5.0000 mg | Freq: Three times a day (TID) | INTRAMUSCULAR | Status: DC | PRN
  Administered 2023-01-03: 5 mg via INTRAVENOUS
  Filled 2023-01-02: qty 2

## 2023-01-02 MED ORDER — SODIUM ZIRCONIUM CYCLOSILICATE 10 G PO PACK
10.0000 g | PACK | ORAL | Status: AC
  Administered 2023-01-02: 10 g via ORAL
  Filled 2023-01-02: qty 1

## 2023-01-02 MED ORDER — SENNOSIDES-DOCUSATE SODIUM 8.6-50 MG PO TABS
2.0000 | ORAL_TABLET | Freq: Two times a day (BID) | ORAL | Status: DC
  Administered 2023-01-02 – 2023-01-04 (×4): 2 via ORAL
  Filled 2023-01-02 (×4): qty 2

## 2023-01-02 MED ORDER — PANTOPRAZOLE SODIUM 40 MG IV SOLR
40.0000 mg | Freq: Every day | INTRAVENOUS | Status: DC
  Administered 2023-01-02: 40 mg via INTRAVENOUS
  Filled 2023-01-02 (×2): qty 10

## 2023-01-02 MED ORDER — INSULIN ASPART 100 UNIT/ML IJ SOLN
0.0000 [IU] | Freq: Every day | INTRAMUSCULAR | Status: DC
  Administered 2023-01-02: 4 [IU] via SUBCUTANEOUS
  Administered 2023-01-03: 2 [IU] via SUBCUTANEOUS
  Filled 2023-01-02: qty 0.05

## 2023-01-02 MED ORDER — INSULIN ASPART 100 UNIT/ML IJ SOLN
0.0000 [IU] | Freq: Three times a day (TID) | INTRAMUSCULAR | Status: DC
  Administered 2023-01-03: 3 [IU] via SUBCUTANEOUS
  Administered 2023-01-03: 5 [IU] via SUBCUTANEOUS
  Administered 2023-01-03 – 2023-01-04 (×3): 8 [IU] via SUBCUTANEOUS
  Filled 2023-01-02: qty 0.15

## 2023-01-02 MED ORDER — ACETAMINOPHEN 650 MG RE SUPP: 650.0000 mg | Freq: Four times a day (QID) | RECTAL | Status: DC | PRN

## 2023-01-02 MED ORDER — SODIUM CHLORIDE 0.9% FLUSH
3.0000 mL | Freq: Two times a day (BID) | INTRAVENOUS | Status: DC
  Administered 2023-01-02 – 2023-01-04 (×4): 3 mL via INTRAVENOUS

## 2023-01-02 MED ORDER — ACETAMINOPHEN 325 MG PO TABS: 650.0000 mg | ORAL_TABLET | Freq: Four times a day (QID) | ORAL | Status: DC | PRN

## 2023-01-02 MED ORDER — HYDROMORPHONE HCL 1 MG/ML IJ SOLN: 0.5000 mg | INTRAMUSCULAR | Status: DC | PRN

## 2023-01-02 MED ORDER — ACETAMINOPHEN 325 MG PO TABS
650.0000 mg | ORAL_TABLET | ORAL | Status: DC
  Administered 2023-01-02 – 2023-01-04 (×11): 650 mg via ORAL
  Filled 2023-01-02 (×11): qty 2

## 2023-01-02 MED ORDER — PANTOPRAZOLE SODIUM 40 MG IV SOLR
40.0000 mg | Freq: Once | INTRAVENOUS | Status: AC
  Administered 2023-01-02: 40 mg via INTRAVENOUS
  Filled 2023-01-02: qty 10

## 2023-01-02 MED ORDER — FENOFIBRATE 54 MG PO TABS
54.0000 mg | ORAL_TABLET | Freq: Every day | ORAL | Status: DC
  Administered 2023-01-03 – 2023-01-04 (×2): 54 mg via ORAL
  Filled 2023-01-02 (×2): qty 1

## 2023-01-02 MED ORDER — ENOXAPARIN SODIUM 40 MG/0.4ML IJ SOSY
40.0000 mg | PREFILLED_SYRINGE | INTRAMUSCULAR | Status: DC
  Administered 2023-01-03 – 2023-01-04 (×2): 40 mg via SUBCUTANEOUS
  Filled 2023-01-02 (×2): qty 0.4

## 2023-01-02 MED ORDER — METOCLOPRAMIDE HCL 5 MG/ML IJ SOLN
10.0000 mg | Freq: Once | INTRAMUSCULAR | Status: AC
  Administered 2023-01-02: 10 mg via INTRAVENOUS
  Filled 2023-01-02: qty 2

## 2023-01-02 MED ORDER — HYDROMORPHONE HCL 1 MG/ML IJ SOLN
0.5000 mg | Freq: Once | INTRAMUSCULAR | Status: AC
  Administered 2023-01-02: 0.5 mg via INTRAVENOUS
  Filled 2023-01-02: qty 1

## 2023-01-02 MED ORDER — LACTATED RINGERS IV SOLN: INTRAVENOUS | Status: DC

## 2023-01-02 MED ORDER — SODIUM BICARBONATE 650 MG PO TABS
650.0000 mg | ORAL_TABLET | Freq: Three times a day (TID) | ORAL | Status: DC
  Administered 2023-01-02 – 2023-01-04 (×5): 650 mg via ORAL
  Filled 2023-01-02 (×6): qty 1

## 2023-01-02 MED ORDER — ASPIRIN 81 MG PO TBEC
81.0000 mg | DELAYED_RELEASE_TABLET | Freq: Every day | ORAL | Status: DC
  Administered 2023-01-03 – 2023-01-04 (×2): 81 mg via ORAL
  Filled 2023-01-02 (×2): qty 1

## 2023-01-02 MED ORDER — POLYETHYLENE GLYCOL 3350 17 G PO PACK: 17.0000 g | PACK | Freq: Every day | ORAL | Status: DC | PRN

## 2023-01-02 MED ORDER — PANCRELIPASE (LIP-PROT-AMYL) 36000-114000 UNITS PO CPEP
36000.0000 [IU] | ORAL_CAPSULE | Freq: Three times a day (TID) | ORAL | Status: DC
  Administered 2023-01-03 – 2023-01-04 (×5): 36000 [IU] via ORAL
  Filled 2023-01-02 (×7): qty 1

## 2023-01-02 MED ORDER — ROSUVASTATIN CALCIUM 10 MG PO TABS
40.0000 mg | ORAL_TABLET | Freq: Every day | ORAL | Status: DC
  Administered 2023-01-03 – 2023-01-04 (×2): 40 mg via ORAL
  Filled 2023-01-02: qty 4
  Filled 2023-01-02: qty 2

## 2023-01-02 MED ORDER — HYDROMORPHONE HCL 1 MG/ML IJ SOLN
1.0000 mg | Freq: Once | INTRAMUSCULAR | Status: AC
  Administered 2023-01-02: 1 mg via INTRAVENOUS
  Filled 2023-01-02: qty 1

## 2023-01-02 MED ORDER — OXYCODONE HCL 5 MG PO TABS
5.0000 mg | ORAL_TABLET | ORAL | Status: DC | PRN
  Administered 2023-01-03: 5 mg via ORAL
  Filled 2023-01-02: qty 1

## 2023-01-02 MED ORDER — ALBUTEROL SULFATE HFA 108 (90 BASE) MCG/ACT IN AERS: 1.0000 | INHALATION_SPRAY | Freq: Four times a day (QID) | RESPIRATORY_TRACT | Status: DC | PRN

## 2023-01-02 NOTE — Assessment & Plan Note (Signed)
And apparently has had's severe hypertriglyceridemia in the past complicated by pancreatitis.  At this time I will check a lipid panel, I will continue patient's Crestor, her no fibrate as well as Repatha injection which is due next Monday

## 2023-01-02 NOTE — ED Notes (Signed)
Lab called regarding pt CMP and lipase. [facility] lab sent labs to [facility] due to labs being lipemic. Dr. Estell Harpin notified

## 2023-01-02 NOTE — Assessment & Plan Note (Signed)
Urinary bladder wall thickening is incidentally noted on the CAT scan with adjacent fat stranding.  However patient has no discomfort focal over there and is not really complaining of pain over there.  I will follow-up the urinalysis and proceed any antibiotic treatment accordingly

## 2023-01-02 NOTE — Inpatient Diabetes Management (Addendum)
Inpatient Diabetes Program Recommendations  AACE/ADA: New Consensus Statement on Inpatient Glycemic Control (2015)  Target Ranges:  Prepandial:   less than 140 mg/dL      Peak postprandial:   less than 180 mg/dL (1-2 hours)      Critically ill patients:  140 - 180 mg/dL   Lab Results  Component Value Date   GLUCAP 368 (H) 01/02/2023   HGBA1C 11.1 (H) 06/03/2022    Review of Glycemic Control  Diabetes history: DM2 Outpatient Diabetes medications: Concentrated humulin R U-500 195 TID, Humalog 20 units tid (increased by [facility] MD at last appt) Current orders for Inpatient glycemic control:  Being evaluated in the ED   HgbA1C - 7.5%  (09/02/22)   Inpatient Diabetes Program Recommendations:     Pt is very familiar to our team. Pt usually is not placed on her home U-500 insulin while inpatient due to hypoglycemia.    -  Semglee 60 BID -   Novolog 0-15 units tid + hs -   Novolog 4 units tid meal coverage if eating >50% of meals  Spoke with pt at bedside regarding her recent medication changes at her Endocrinologist office to verify the dosing. Correct dosages are listed about which are also listed in her Endocrinologist note. Pt glucose trends both inpatient and outpatient is difficult to control. Pt has very close follow up with her Endocrinologist and sees them every 3 months with adjustments made. Pt has a CGM at home which is currently off but she will restart once discharged home. Discussed plan for her insulin dosing while in the hospital. Pt is in agreement. Will follow glucose trends while here.   Thanks,  Christena Deem RN, MSN, BC-ADM Inpatient Diabetes Coordinator Team Pager [phone] (8a-5p)

## 2023-01-02 NOTE — ED Notes (Signed)
Pt requesting pain medication and asking for water. Dr. Estell Harpin notified to see if pt is cleared to drink

## 2023-01-02 NOTE — Assessment & Plan Note (Addendum)
Time of this is chronic, however there is an acute worsening, with new elevation in bilirubin as well as cholestatic pattern of LFT elevation.  I will check liver ultrasound, and acute hepatitis panel.  Note patient has no white count or fever to suggest cholangitis.  Patient has been complaining of nausea and epigastric discomfort as a marked complaint of hers  Take ulcer diseases in the consideration of patient having epigastric discomfort, therefore I have ordered empiric pantoprazole

## 2023-01-02 NOTE — Assessment & Plan Note (Signed)
-  Today, precipitated patient's presentation to the ER.  Maintain the patient on telemetry.  Monitor glucose control.  Patient has several risk factors for syncopized and including volume loss from her enteritis as well as severe electrolyte abnormalities including hyperkalemia and acidosis.  Which will be corrected.  Echo may be pursued if persistent concerns remain.  EKG does not demonstrate QRST abnormalities

## 2023-01-02 NOTE — Assessment & Plan Note (Signed)
Start insulin sliding scale moderate intensity.  Monitor glycemic control.

## 2023-01-02 NOTE — ED Triage Notes (Signed)
Pt BIBA from home with c/o N/V x4, diarrhea since yesterday. Had syncopal episode but for unknown time. Thinks it lasted longer than 10 mins. Pt has Rx medication, changed hypertension medication in past month, diabetes medication changed on Monday. Overall "not feeling well". Hx of pancreatitis.   CBG 371 172/102 HR 118 99% RA

## 2023-01-02 NOTE — H&P (Signed)
History and Physical    Patient: Jennifer [name] WUJ:[id number] DOB: 04/08/1990 DOA: 01/02/2023 DOS: the patient was seen and examined on 01/02/2023 PCP: Nathaneil Canary, PA-C  Patient coming from: Home  Chief Complaint:  Chief Complaint  Patient presents with   Nausea   Emesis   Diarrhea   Hypertension   HPI: Jennifer [name] is a 32 y.o. female with medical history significant of hyper cholesterolemia associated with prior episode of chronic pancreatic disease.  Patient also has had prior brain tumor complicated by right-sided ptosis as well as left upper extremity weakness patient was in her usual state of health till approximately 7 days ago when patient reports a new onset of diarrhea about 5-7 episodes light brown without any blood and daily loose.  Patient also reports 4 days of epigastric discomfort/aching that has gotten progressively worse.  Patient further reports new onset of vomiting yesterday at least 5 episodes without any bleeding.  Patient was also having trouble eating or drinking last several days.  She denies any fever.  Dysuria.  She finally had an episode of passing out earlier this morning, which was unwitnessed.  Without any premonitory symptoms such as shortness of breath or palpitation or presyncope reported.  Patient was able to regain consciousness, after an uncertain amount of time as the episode was unwitnessed, and called 911 and come to the hospital. No incontinence, tongue injury  Patient's only complaint here is ongoing nausea and some epigastric discomfort for which patient has required several doses of Dilaudid.  With poor pain control.  See work up below. Medical eval is sought. Review of Systems: As mentioned in the history of present illness. All other systems reviewed and are negative. Past Medical History:  Diagnosis Date   Afib (HCC) 05/12/2021   Brain tumor (HCC) 03/29/1995   astrocytoma   CHF (congestive heart failure) (HCC)    Cholesterosis     CKD (chronic kidney disease) stage 4, GFR 15-29 ml/min (HCC) 05/13/2021   DM (diabetes mellitus) (HCC) 10/10/2018   Fatty liver    HTN (hypertension) 10/10/2018   Hypothyroidism 10/10/2018   Lipoprotein deficiency    Lung disease    longevity long term   Pancreatitis    Polycystic ovary syndrome    Past Surgical History:  Procedure Laterality Date   ABDOMINAL SURGERY     pt states during miscarriage got her intestine   BRAIN SURGERY     EYE MUSCLE SURGERY Right 03/28/2014   PRESSURE SENSOR/CARDIOMEMS N/A 12/06/2021   Procedure: PRESSURE SENSOR/CARDIOMEMS;  Surgeon: Dolores Patty, MD;  Location: [facility];  Service: Cardiovascular;  Laterality: N/A;   RIGHT HEART CATH N/A 08/05/2021   Procedure: RIGHT HEART CATH;  Surgeon: Dolores Patty, MD;  Location: [facility];  Service: Cardiovascular;  Laterality: N/A;   RIGHT HEART CATH N/A 12/06/2021   Procedure: RIGHT HEART CATH;  Surgeon: Dolores Patty, MD;  Location: [facility];  Service: Cardiovascular;  Laterality: N/A;   RIGHT/LEFT HEART CATH AND CORONARY ANGIOGRAPHY N/A 12/03/2020   Procedure: RIGHT/LEFT HEART CATH AND CORONARY ANGIOGRAPHY;  Surgeon: Yates Decamp, MD;  Location: [facility];  Service: Cardiovascular;  Laterality: N/A;   VENTRICULOSTOMY  03/28/1997   Social History:  reports that she has never smoked. She has never used smokeless tobacco. She reports that she does not drink alcohol and does not use drugs.  Allergies  Allergen Reactions   Ketamine Other (See Comments)    In vegetative  state for 15 minutes per pt   [place] Itching    Itchy throat  Itchy throat Itchy throat Itchy throat Itchy throat    Itchy throat  Itchy throat Itchy throat Itchy throat Itchy throat    Itchy throat   Morphine Itching, Rash and Hives    Sorethroat   Penicillins Hives, Itching and Rash    Has patient had a PCN reaction causing immediate rash, facial/tongue/throat swelling, SOB or  lightheadedness with hypotension: Y Has patient had a PCN reaction causing severe rash involving mucus membranes or skin necrosis: Y Has patient had a PCN reaction that required hospitalization: N Has patient had a PCN reaction occurring within the last 10 years: Y   Clarithromycin Other (See Comments)    Stomach pain   Erythromycin    Fentanyl Nausea And Vomiting   Penicillin V Hives, Itching and Other (See Comments)    Gi upset, also   Shellfish Allergy Other (See Comments)    Pt has never had shellfish but tested positive on allergy test. Pt states contrast in CT is okay   Prednisone Rash    Family History  Problem Relation Age of Onset   Diabetes Mother    Hypertension Mother    Hyperlipidemia Mother    Thyroid disease Mother    Hypertension Father    Diabetes Father    Pancreatic cancer Paternal Aunt    Pancreatic cancer Paternal Uncle    Colon cancer Neg Hx    Esophageal cancer Neg Hx    Stomach cancer Neg Hx     Prior to Admission medications   Medication Sig Start Date End Date Taking? Authorizing Provider  butalbital-acetaminophen-caffeine (FIORICET) 50-325-40 MG tablet Take 1 tablet by mouth in the morning and at bedtime. 09/26/22  Yes [provider]  carvedilol (COREG) 3.125 MG tablet Take 1 tablet (3.125 mg total) by mouth 2 (two) times daily. 10/04/22  Yes [name], [name], MD  DULoxetine (CYMBALTA) 60 MG capsule Take 60 mg by mouth at bedtime. 09/26/22  Yes [provider]  albuterol (VENTOLIN HFA) 108 (90 Base) MCG/ACT inhaler Inhale 1-2 puffs into the lungs every 6 (six) hours as needed for wheezing or shortness of breath. 12/31/18   [provider]  aspirin EC 81 MG EC tablet Take 1 tablet (81 mg total) by mouth daily. Swallow whole. 12/07/20   [name], DO  Azelastine HCl 137 MCG/SPRAY SOLN Place 1 spray into the nose daily. 11/28/22 05/27/23  [provider]  CREON 36000-114000 units CPEP capsule Take 36,000 Units by mouth  3 (three) times daily.    [provider]  dicyclomine (BENTYL) 20 MG tablet Take 1 tablet (20 mg total) by mouth 2 (two) times daily. 09/15/22   [name], [name], MD  doxycycline (VIBRAMYCIN) 100 MG capsule Take 1 capsule (100 mg total) by mouth 2 (two) times daily. 10/18/22   Pollyann Savoy, MD  ergocalciferol (VITAMIN D2) 1.25 MG (50000 UT) capsule Take 50,000 Units by mouth every 7 (seven) days.    [provider]  Evolocumab (REPATHA) 140 MG/ML SOSY Inject 140 mg into the skin See admin instructions. Inject 140 mg subcutaneously every other Monday evening    [provider]  fenofibrate 54 MG tablet Take 1 tablet (54 mg total) by mouth daily. 12/01/22   [name], [name], MD  hydrALAZINE (APRESOLINE) 50 MG tablet Take 50 mg by mouth 3 (three) times daily. 11/16/22   [provider]  Insulin Pen Needle 32G  X 4 MM MISC 1 Device by Does not apply route QID. For use with insulin pens 02/13/22   [name], MD  insulin regular human CONCENTRATED (HUMULIN R U-500 KWIKPEN) 500 UNIT/ML KwikPen Inject 185 Units into the skin 3 (three) times daily with meals. 09/20/22 01/02/23  [name], MD  ivabradine (CORLANOR) 7.5 MG TABS tablet Take 1 tablet (7.5 mg total) by mouth 2 (two) times daily with a meal. 08/11/22   [name], MD  levocetirizine (XYZAL) 5 MG tablet Take 5 mg by mouth daily. 03/05/22   [provider]  levothyroxine (SYNTHROID) 137 MCG tablet Take 137 mcg by mouth daily. 09/20/22   [provider]  pantoprazole (PROTONIX) 40 MG tablet Take 40 mg by mouth daily. 12/24/19   [provider]  pregabalin (LYRICA) 300 MG capsule Take 300 mg by mouth at bedtime.    [provider]  prochlorperazine (COMPAZINE) 5 MG tablet Take 5 mg by mouth at bedtime.    [provider]  rosuvastatin (CRESTOR) 40 MG tablet Take 40 mg by mouth daily. 08/09/21   [provider]  sodium bicarbonate 650 MG tablet Take  650 mg by mouth 2 (two) times daily.    [provider]  spironolactone (ALDACTONE) 25 MG tablet Take 25 mg by mouth daily.    [provider]  torsemide (DEMADEX) 20 MG tablet Take 2 tablets (40 mg total) by mouth daily. May take 1 additional tablet as needed 10/05/22   [name], [name], MD  tretinoin (RETIN-A) 0.05 % cream Apply 1 application topically at bedtime. 01/17/21   [provider]  VASCEPA 1 g capsule Take 2 capsules (2 g total) by mouth 2 (two) times daily. 12/01/22   [name], MD    Physical Exam: Vitals:   01/02/23 1447 01/02/23 1500 01/02/23 1635 01/02/23 1839  BP:  (!) 155/104 (!) 170/104   Pulse:  (!) 113 (!) 110   Resp:  15 14   Temp: 98.1 F (36.7 C)   97.8 F (36.6 C)  TempSrc: Oral   Oral  SpO2:  92% 92%    general: Patient does not appear to be distressed, gives her history herself, Respiratory exam: Bilateral i air entry is vesicular Cardiovascular exam S1-S2 normal Abdomen: Patient slightly overweight appearing, bowel sounds are normal, no active vomiting.  All quadrants are soft without any guarding.  Patient reports generalized tenderness.  No rebound Extremities warm without edema left upper extremity paresis is noted.  Right-sided eye ptosis is noted no focal tenderness of the spine is elicited neck movements are free no cranial tenderness. Data Reviewed:  Labs on Admission:  Results for orders placed or performed during the hospital encounter of 01/02/23 (from the past 24 hour(s))  CBG monitoring, ED     Status: Abnormal   Collection Time: 01/02/23 11:08 AM  Result Value Ref Range   Glucose-Capillary 368 (H) 70 - 99 mg/dL  CBC with Differential     Status: Abnormal   Collection Time: 01/02/23 11:08 AM  Result Value Ref Range   WBC 8.1 4.0 - 10.5 K/uL   RBC 3.87 3.87 - 5.11 MIL/uL   Hemoglobin 11.8 (L) 12.0 - 15.0 g/dL   HCT 19.1 (L) 47.8 - 29.5 %   MCV 92.2 80.0 - 100.0 fL   MCH 30.5 26.0 - 34.0 pg   MCHC 33.1  30.0 - 36.0 g/dL   RDW 62.1 (H) 30.8 - 65.7 %   Platelets  139 (L) 150 - 400 K/uL   nRBC 0.4 (H) 0.0 - 0.2 %   Neutrophils Relative % 58 %   Neutro Abs 4.8 1.7 - 7.7 K/uL   Lymphocytes Relative 32 %   Lymphs Abs 2.6 0.7 - 4.0 K/uL   Monocytes Relative 6 %   Monocytes Absolute 0.5 0.1 - 1.0 K/uL   Eosinophils Relative 2 %   Eosinophils Absolute 0.1 0.0 - 0.5 K/uL   Basophils Relative 1 %   Basophils Absolute 0.1 0.0 - 0.1 K/uL   Immature Granulocytes 1 %   Abs Immature Granulocytes 0.06 0.00 - 0.07 K/uL  Comprehensive metabolic panel     Status: Abnormal   Collection Time: 01/02/23 11:08 AM  Result Value Ref Range   Sodium 135 135 - 145 mmol/L   Potassium 6.2 (H) 3.5 - 5.1 mmol/L   Chloride 103 98 - 111 mmol/L   CO2 15 (L) 22 - 32 mmol/L   Glucose, Bld 394 (H) 70 - 99 mg/dL   BUN 36 (H) 6 - 20 mg/dL   Creatinine, Ser 2.95 (H) 0.44 - 1.00 mg/dL   Calcium 9.7 8.9 - 62.1 mg/dL   Total Protein RESULTS UNAVAILABLE DUE TO INTERFERING SUBSTANCE 6.5 - 8.1 g/dL   Albumin 4.5 3.5 - 5.0 g/dL   AST 308 (H) 15 - 41 U/L   ALT 127 (H) 0 - 44 U/L   Alkaline Phosphatase 182 (H) 38 - 126 U/L   Total Bilirubin 1.7 (H) 0.3 - 1.2 mg/dL   GFR, Estimated 50 (L) >60 mL/min   Anion gap 17 (H) 5 - 15  Lipase, blood     Status: None   Collection Time: 01/02/23 11:08 AM  Result Value Ref Range   Lipase 25 11 - 51 U/L  hCG, quantitative, pregnancy     Status: None   Collection Time: 01/02/23 11:08 AM  Result Value Ref Range   hCG, Beta Chain, Quant, S <1 <5 mIU/mL  Blood gas, venous (at [facility] and [facility])     Status: Abnormal   Collection Time: 01/02/23 11:37 AM  Result Value Ref Range   pH, Ven 7.31 7.25 - 7.43   pCO2, Ven 41 (L) 44 - 60 mmHg   pO2, Ven 42 32 - 45 mmHg   Bicarbonate 20.6 20.0 - 28.0 mmol/L   Acid-base deficit 5.3 (H) 0.0 - 2.0 mmol/L   O2 Saturation 70.1 %   Patient temperature 37.0   I-stat chem 8, ED (not at [facility], [facility] or [facility])     Status: Abnormal   Collection Time: 01/02/23  2:38  PM  Result Value Ref Range   Sodium 134 (L) 135 - 145 mmol/L   Potassium 6.4 (HH) 3.5 - 5.1 mmol/L   Chloride 113 (H) 98 - 111 mmol/L   BUN 43 (H) 6 - 20 mg/dL   Creatinine, Ser 6.57 (H) 0.44 - 1.00 mg/dL   Glucose, Bld 846 (H) 70 - 99 mg/dL   Calcium, Ion 9.62 9.52 - 1.40 mmol/L   TCO2 20 (L) 22 - 32 mmol/L   Hemoglobin 11.2 (L) 12.0 - 15.0 g/dL   HCT 84.1 (L) 32.4 - 40.1 %   Comment NOTIFIED PHYSICIAN   CBG monitoring, ED     Status: Abnormal   Collection Time: 01/02/23  4:24 PM  Result Value Ref Range   Glucose-Capillary 206 (H) 70 - 99 mg/dL   Basic Metabolic Panel: Recent Labs  Lab 01/02/23 1108 01/02/23 1438  NA 135 134*  K 6.2* 6.4*  CL 103 113*  CO2 15*  --   GLUCOSE 394* 351*  BUN 36* 43*  CREATININE 1.44* 1.50*  CALCIUM 9.7  --    Liver Function Tests: Recent Labs  Lab 01/02/23 1108  AST 101*  ALT 127*  ALKPHOS 182*  BILITOT 1.7*  PROT RESULTS UNAVAILABLE DUE TO INTERFERING SUBSTANCE  ALBUMIN 4.5   Recent Labs  Lab 01/02/23 1108  LIPASE 25   No results for input(s): "AMMONIA" in the last 168 hours. CBC: Recent Labs  Lab 01/02/23 1108 01/02/23 1438  WBC 8.1  --   NEUTROABS 4.8  --   HGB 11.8* 11.2*  HCT 35.7* 33.0*  MCV 92.2  --   PLT 139*  --    Cardiac Enzymes: No results for input(s): "CKTOTAL", "CKMB", "CKMBINDEX", "TROPONINIHS" in the last 168 hours.  BNP (last 3 results) No results for input(s): "PROBNP" in the last 8760 hours. CBG: Recent Labs  Lab 01/02/23 1108 01/02/23 1624  GLUCAP 368* 206*    Radiological Exams on Admission:  CT ABDOMEN PELVIS W CONTRAST  Result Date: 01/02/2023 CLINICAL DATA:  Abdominal pain, diarrhea, nausea and vomiting EXAM: CT ABDOMEN AND PELVIS WITH CONTRAST TECHNIQUE: Multidetector CT imaging of the abdomen and pelvis was performed using the standard protocol following bolus administration of intravenous contrast. RADIATION DOSE REDUCTION: This exam was performed according to the departmental  dose-optimization program which includes automated exposure control, adjustment of the mA and/or kV according to patient size and/or use of iterative reconstruction technique. CONTRAST:  80mL OMNIPAQUE IOHEXOL 300 MG/ML  SOLN COMPARISON:  10/18/2022 FINDINGS: Lower chest: No acute abnormality. Linear metallic foreign body which appears to be within a subsegmental artery of the posterior segment of the left lower lobe, as seen by prior examination (series 2, image 1). Hepatobiliary: No solid liver abnormality is seen. Hepatomegaly, maximum coronal span 21.8 cm. Hepatic steatosis. No gallstones, gallbladder wall thickening, or biliary dilatation. Pancreas: Unremarkable. No pancreatic ductal dilatation or surrounding inflammatory changes. Spleen: Splenomegaly, maximum coronal span 14.1 cm. Adrenals/Urinary Tract: Adrenal glands are unremarkable. Kidneys are normal, without renal calculi, solid lesion, or hydronephrosis. Urinary bladder wall thickening and adjacent fat stranding (series 2, image 70). Stomach/Bowel: Stomach is within normal limits. Appendix appears normal. No evidence of bowel wall thickening, distention, or inflammatory changes. Vascular/Lymphatic: Severe aortic atherosclerosis. No enlarged abdominal or pelvic lymph nodes. Reproductive: No mass or other significant abnormality. Small benign functional ovarian follicles, requiring no further follow-up or characterization. Other: No abdominal wall hernia or abnormality. No ascites. Musculoskeletal: No acute or significant osseous findings. IMPRESSION: 1. Urinary bladder wall thickening and adjacent fat stranding, suggestive of nonspecific infectious or inflammatory cystitis. Correlate with urinalysis. 2. Hepatomegaly and hepatic steatosis 3. Linear metallic foreign body which appears to be within a subsegmental artery of the posterior segment of the left lower lobe, as seen by multiple prior examinations and of uncertain nature. 4. Severe abdominal aortic  atherosclerosis markedly advanced for patient age. Aortic Atherosclerosis (ICD10-I70.0). Electronically Signed   By: Jearld Lesch M.D.   On: 01/02/2023 18:21    EKG: Independently reviewed. No QRST wave changes.   Assessment and Plan: * Syncope -Today, precipitated patient's presentation to the ER.  Maintain the patient on telemetry.  Monitor glucose control.  Patient has several risk factors for syncopized and including volume loss from her enteritis as well as severe electrolyte abnormalities including hyperkalemia and acidosis.  Which will be corrected.  Echo may be pursued if persistent concerns  remain.  EKG does not demonstrate QRST abnormalities  Hypertriglyceridemia And apparently has had's severe hypertriglyceridemia in the past complicated by pancreatitis.  At this time I will check a lipid panel, I will continue patient's Crestor, her no fibrate as well as Repatha injection which is due next Monday  Bladder wall thickening Urinary bladder wall thickening is incidentally noted on the CAT scan with adjacent fat stranding.  However patient has no discomfort focal over there and is not really complaining of pain over there.  I will follow-up the urinalysis and proceed any antibiotic treatment accordingly  LFT elevation Time of this is chronic, however there is an acute worsening, with new elevation in bilirubin as well as cholestatic pattern of LFT elevation.  I will check liver ultrasound, and acute hepatitis panel.  Note patient has no white count or fever to suggest cholangitis.  Patient has been complaining of nausea and epigastric discomfort as a marked complaint of hers  Take ulcer diseases in the consideration of patient having epigastric discomfort, therefore I have ordered empiric pantoprazole  Enteritis Reports about 7 days of diarrhea and for 24 hours of vomiting.  I will check C. difficile and stool pathogen panel.  Hold off on antibiotics at this  time.  Hyperkalemia Patient seems to have chronic hyperkalemia.  In the setting of CKD, no AKI is currently appreciated.  Patient also has metabolic acidosis with wide elevation in anion gap.  Patient has been treated with insulin in the ER, got sodium bicarbonate IV push.  As well as IV fluids.  I will continue with the same.  I will order 1 dose of Lokelma, trend the potassium level.  And start the patient on oral sodium bicarbonate Hold spironolactone.  Severe hyperglycemia due to diabetes mellitus (HCC) Start insulin sliding scale moderate intensity.  Monitor glycemic control.  Patiet home meds collection was interupted as ptient was too tired (see pharm note), please reconcile meds, once full meds collected.    Advance Care Planning:   Code Status: Prior full code.  Consults: none at this time.  Family Communication: per patietn.  Severity of Illness: The appropriate patient status for this patient is INPATIENT. Inpatient status is judged to be reasonable and necessary in order to provide the required intensity of service to ensure the patient's safety. The patient's presenting symptoms, physical exam findings, and initial radiographic and laboratory data in the context of their chronic comorbidities is felt to place them at high risk for further clinical deterioration. Furthermore, it is not anticipated that the patient will be medically stable for discharge from the hospital within 2 midnights of admission.   * I certify that at the point of admission it is my clinical judgment that the patient will require inpatient hospital care spanning beyond 2 midnights from the point of admission due to high intensity of service, high risk for further deterioration and high frequency of surveillance required.*  Author: Nolberto Hanlon, MD 01/02/2023 6:42 PM  For on call review [email or web address].

## 2023-01-02 NOTE — Telephone Encounter (Signed)
Pharmacy Patient Advocate Encounter  Received notification from [facility] Medicaid that Prior Authorization for fenofibrate has been APPROVED from 12/02/22 to 03/27/2098   PA #/Case ID/Reference #: [id number]

## 2023-01-02 NOTE — ED Notes (Signed)
Pt is requesting benadryl, Dr. Estell Harpin notified

## 2023-01-02 NOTE — Assessment & Plan Note (Addendum)
Patient seems to have chronic hyperkalemia.  In the setting of CKD, no AKI is currently appreciated.  Patient also has metabolic acidosis with wide elevation in anion gap.  Patient has been treated with insulin in the ER, got sodium bicarbonate IV push.  As well as IV fluids.  I will continue with the same.  I will order 1 dose of Lokelma, trend the potassium level.  And start the patient on oral sodium bicarbonate Hold spironolactone.

## 2023-01-02 NOTE — Assessment & Plan Note (Signed)
" >>  ASSESSMENT AND PLAN FOR LFT ELEVATION WRITTEN ON 01/02/2023  7:07 PM BY MOODY ALTO, MD  Time of this is chronic, however there is an acute worsening, with new elevation in bilirubin as well as cholestatic pattern of LFT elevation.  I will check liver ultrasound, and acute hepatitis panel.  Note patient has no white count or fever to suggest cholangitis.  Patient has been complaining of nausea and epigastric discomfort as a marked complaint of hers  Take ulcer diseases in the consideration of patient having epigastric discomfort, therefore I have ordered empiric pantoprazole  "

## 2023-01-02 NOTE — Assessment & Plan Note (Signed)
Reports about 7 days of diarrhea and for 24 hours of vomiting.  I will check C. difficile and stool pathogen panel.  Hold off on antibiotics at this time.

## 2023-01-02 NOTE — ED Provider Notes (Signed)
[facility] Provider Note   CSN: [id number] Arrival date & time: 01/02/23  1045     History {Add pertinent medical, surgical, social history, OB history to HPI:1} Chief Complaint  Patient presents with   Nausea   Emesis   Diarrhea   Hypertension    Mikal Wisman is a 32 y.o. female.  Patient complains of nausea vomiting and diarrhea.  She has a history of heart failure diabetes kidney disease elevated lipids and chronic abdominal pain with pancreatitis.  She thinks her pancreas is acting up again  The history is provided by the patient and medical records. No language interpreter was used.  Emesis Severity:  Moderate Timing:  Constant Quality:  Bilious material Able to tolerate:  Liquids Progression:  Worsening Chronicity:  Recurrent Recent urination:  Normal Context: not post-tussive   Relieved by:  Nothing Worsened by:  Nothing Associated symptoms: abdominal pain and diarrhea   Associated symptoms: no cough and no headaches   Diarrhea Associated symptoms: abdominal pain and vomiting   Associated symptoms: no headaches   Hypertension Associated symptoms include abdominal pain. Pertinent negatives include no chest pain and no headaches.       Home Medications Prior to Admission medications   Medication Sig Start Date End Date Taking? Authorizing Provider  albuterol (VENTOLIN HFA) 108 (90 Base) MCG/ACT inhaler Inhale 1-2 puffs into the lungs every 6 (six) hours as needed for wheezing or shortness of breath. 12/31/18   [provider]  aspirin EC 81 MG EC tablet Take 1 tablet (81 mg total) by mouth daily. Swallow whole. 12/07/20   [name], DO  Azelastine HCl 137 MCG/SPRAY SOLN Place 1 spray into the nose daily. 11/28/22 05/27/23  [provider]  butalbital-acetaminophen-caffeine (FIORICET) 50-325-40 MG tablet Take 1 tablet by mouth in the morning and at bedtime. 09/26/22   [provider]   carvedilol (COREG) 3.125 MG tablet Take 1 tablet (3.125 mg total) by mouth 2 (two) times daily. 10/04/22   [name], [name], MD  CREON [phone] units CPEP capsule Take 36,000 Units by mouth 3 (three) times daily.    [provider]  dicyclomine (BENTYL) 20 MG tablet Take 1 tablet (20 mg total) by mouth 2 (two) times daily. 09/15/22   [name], [name], MD  doxycycline (VIBRAMYCIN) 100 MG capsule Take 1 capsule (100 mg total) by mouth 2 (two) times daily. 10/18/22   Pollyann Savoy, MD  DULoxetine (CYMBALTA) 60 MG capsule Take by mouth. 09/26/22   [provider]  ergocalciferol (VITAMIN D2) 1.25 MG (50000 UT) capsule Take 50,000 Units by mouth every 7 (seven) days.    [provider]  Evolocumab (REPATHA) 140 MG/ML SOSY Inject 140 mg into the skin See admin instructions. Inject 140 mg subcutaneously every other Monday evening    [provider]  fenofibrate 54 MG tablet Take 1 tablet (54 mg total) by mouth daily. 12/01/22   [name], [name], MD  hydrALAZINE (APRESOLINE) 50 MG tablet Take 50 mg by mouth 3 (three) times daily. 11/16/22   [provider]  Insulin Pen Needle 32G X 4 MM MISC 1 Device by Does not apply route QID. For use with insulin pens 02/13/22   [name], MD  insulin regular human CONCENTRATED (HUMULIN R U-500 KWIKPEN) 500 UNIT/ML KwikPen Inject 185 Units into the skin 3 (three) times daily with meals. 09/20/22 10/20/22  [name], MD  ivabradine (CORLANOR) 7.5 MG TABS tablet Take  1 tablet (7.5 mg total) by mouth 2 (two) times daily with a meal. 08/11/22   [name], MD  levocetirizine (XYZAL) 5 MG tablet Take 5 mg by mouth daily. 03/05/22   [provider]  levothyroxine (SYNTHROID) 137 MCG tablet Take 137 mcg by mouth daily. 09/20/22   [provider]  pantoprazole (PROTONIX) 40 MG tablet Take 40 mg by mouth daily. 12/24/19   [provider]  pregabalin (LYRICA) 300 MG capsule Take 300 mg by  mouth at bedtime.    [provider]  prochlorperazine (COMPAZINE) 5 MG tablet Take 5 mg by mouth at bedtime.    [provider]  rosuvastatin (CRESTOR) 40 MG tablet Take 40 mg by mouth daily. 08/09/21   [provider]  sodium bicarbonate 650 MG tablet Take 650 mg by mouth 2 (two) times daily.    [provider]  spironolactone (ALDACTONE) 25 MG tablet Take 25 mg by mouth daily.    [provider]  torsemide (DEMADEX) 20 MG tablet Take 2 tablets (40 mg total) by mouth daily. May take 1 additional tablet as needed 10/05/22   [name], [name], MD  tretinoin (RETIN-A) 0.05 % cream Apply 1 application topically at bedtime. 01/17/21   [provider]  VASCEPA 1 g capsule Take 2 capsules (2 g total) by mouth 2 (two) times daily. 12/01/22   [name], [name], MD      Allergies    Ketamine, Erythromycin, Fentanyl, Maitake mushroom [maitake], Penicillin v, Shellfish allergy, Morphine, Penicillins, and Prednisone    Review of Systems   Review of Systems  Constitutional:  Negative for appetite change and fatigue.  HENT:  Negative for congestion, ear discharge and sinus pressure.   Eyes:  Negative for discharge.  Respiratory:  Negative for cough.   Cardiovascular:  Negative for chest pain.  Gastrointestinal:  Positive for abdominal pain, diarrhea and vomiting.  Genitourinary:  Negative for frequency and hematuria.  Musculoskeletal:  Negative for back pain.  Skin:  Negative for rash.  Neurological:  Negative for seizures and headaches.  Psychiatric/Behavioral:  Negative for hallucinations.     Physical Exam Updated Vital Signs BP (!) 155/104   Pulse (!) 113   Temp 98.1 F (36.7 C) (Oral)   Resp 15   SpO2 92%  Physical Exam Vitals and nursing note reviewed.  Constitutional:      Appearance: [name] well-developed.  HENT:     Head: Normocephalic.     Mouth/Throat:     Mouth: Mucous membranes are moist.  Eyes:     General: No scleral  icterus.    Conjunctiva/sclera: Conjunctivae normal.  Neck:     Thyroid: No thyromegaly.  Cardiovascular:     Rate and Rhythm: Normal rate and regular rhythm.     Heart sounds: No murmur heard.    No friction rub. No gallop.  Pulmonary:     Breath sounds: No stridor. No wheezing or rales.  Chest:     Chest wall: No tenderness.  Abdominal:     General: There is no distension.     Tenderness: There is abdominal tenderness. There is no rebound.  Musculoskeletal:        General: Normal range of motion.     Cervical back: Neck supple.  Lymphadenopathy:     Cervical: No cervical adenopathy.  Skin:    Findings: No erythema or rash.  Neurological:     Mental Status: [name] alert and oriented to person, place, and  time.     Motor: No abnormal muscle tone.     Coordination: Coordination normal.  Psychiatric:        Behavior: Behavior normal.     ED Results / Procedures / Treatments   Labs (all labs ordered are listed, but only abnormal results are displayed) Labs Reviewed  CBC WITH DIFFERENTIAL/PLATELET - Abnormal; Notable for the following components:      Result Value   Hemoglobin 11.8 (*)    HCT 35.7 (*)    RDW 17.2 (*)    Platelets 139 (*)    nRBC 0.4 (*)    All other components within normal limits  BLOOD GAS, VENOUS - Abnormal; Notable for the following components:   pCO2, Ven 41 (*)    Acid-base deficit 5.3 (*)    All other components within normal limits  CBG MONITORING, ED - Abnormal; Notable for the following components:   Glucose-Capillary 368 (*)    All other components within normal limits  I-STAT CHEM 8, ED - Abnormal; Notable for the following components:   Sodium 134 (*)    Potassium 6.4 (*)    Chloride 113 (*)    BUN 43 (*)    Creatinine, Ser 1.50 (*)    Glucose, Bld 351 (*)    TCO2 20 (*)    Hemoglobin 11.2 (*)    HCT 33.0 (*)    All other components within normal limits  COMPREHENSIVE METABOLIC PANEL  LIPASE, BLOOD  HCG, QUANTITATIVE, PREGNANCY   POC URINE PREG, ED    EKG EKG Interpretation Date/Time:  Monday January 02 2023 14:44:05 EDT Ventricular Rate:  103 PR Interval:  107 QRS Duration:  97 QT Interval:  374 QTC Calculation: 490 R Axis:   40  Text Interpretation: Sinus or ectopic atrial tachycardia Low voltage, extremity leads Nonspecific T abnormalities, lateral leads Borderline prolonged QT interval Confirmed by Bethann Berkshire [phone]) on 01/02/2023 3:14:49 PM  Radiology No results found.  Procedures Procedures  {Document cardiac monitor, telemetry assessment procedure when appropriate:1}  Medications Ordered in ED Medications  HYDROmorphone (DILAUDID) injection 1 mg (has no administration in time range)  sodium chloride 0.9 % bolus 1,000 mL (0 mLs Intravenous Stopped 01/02/23 1417)  ondansetron (ZOFRAN) injection 4 mg (4 mg Intravenous Given 01/02/23 1137)  metoCLOPramide (REGLAN) injection 10 mg (10 mg Intravenous Given 01/02/23 1136)  pantoprazole (PROTONIX) injection 40 mg (40 mg Intravenous Given 01/02/23 1136)  HYDROmorphone (DILAUDID) injection 0.5 mg (0.5 mg Intravenous Given 01/02/23 1147)  metoCLOPramide (REGLAN) injection 10 mg (10 mg Intravenous Given 01/02/23 1444)  HYDROmorphone (DILAUDID) injection 0.5 mg (0.5 mg Intravenous Given 01/02/23 1444)  sodium chloride 0.9 % bolus 1,000 mL (1,000 mLs Intravenous New Bag/Given 01/02/23 1528)  insulin aspart (novoLOG) injection 10 Units (10 Units Intravenous Given 01/02/23 1528)  sodium bicarbonate injection 50 mEq (50 mEq Intravenous Given 01/02/23 1528)    ED Course/ Medical Decision Making/ A&P   {   Click here for ABCD2, HEART and other calculatorsREFRESH Note before signing :1}                              Medical Decision Making Amount and/or Complexity of Data Reviewed Labs: ordered. Radiology: ordered.  Risk OTC drugs. Prescription drug management.   Patient with persistent abdominal pain and vomiting and diarrhea.  She will be admitted to  medicine.  CT scan abdomen pending.  Patient has chronic problems of abdominal pain from pancreatitis  from her hyperlipidemia  {Document critical care time when appropriate:1} {Document review of labs and clinical decision tools ie heart score, Chads2Vasc2 etc:1}  {Document your independent review of radiology images, and any outside records:1} {Document your discussion with family members, caretakers, and with consultants:1} {Document social determinants of health affecting pt's care:1} {Document your decision making why or why not admission, treatments were needed:1} Final Clinical Impression(s) / ED Diagnoses Final diagnoses:  None    Rx / DC Orders ED Discharge Orders     None

## 2023-01-03 ENCOUNTER — Other Ambulatory Visit: Payer: Self-pay

## 2023-01-03 ENCOUNTER — Encounter (HOSPITAL_COMMUNITY): Payer: Self-pay | Admitting: Internal Medicine

## 2023-01-03 DIAGNOSIS — R7989 Other specified abnormal findings of blood chemistry: Secondary | ICD-10-CM | POA: Diagnosis not present

## 2023-01-03 DIAGNOSIS — E781 Pure hyperglyceridemia: Secondary | ICD-10-CM

## 2023-01-03 DIAGNOSIS — K529 Noninfective gastroenteritis and colitis, unspecified: Secondary | ICD-10-CM | POA: Diagnosis not present

## 2023-01-03 DIAGNOSIS — R55 Syncope and collapse: Secondary | ICD-10-CM | POA: Diagnosis not present

## 2023-01-03 LAB — HEPATITIS PANEL, ACUTE
HCV Ab: NONREACTIVE
Hep A IgM: NONREACTIVE
Hep B C IgM: NONREACTIVE
Hepatitis B Surface Ag: NONREACTIVE

## 2023-01-03 LAB — CBC
HCT: 28.8 % — ABNORMAL LOW (ref 36.0–46.0)
Hemoglobin: 10.4 g/dL — ABNORMAL LOW (ref 12.0–15.0)
MCH: 33.4 pg (ref 26.0–34.0)
MCHC: 36.1 g/dL — ABNORMAL HIGH (ref 30.0–36.0)
MCV: 92.6 fL (ref 80.0–100.0)
Platelets: 138 10*3/uL — ABNORMAL LOW (ref 150–400)
RBC: 3.11 MIL/uL — ABNORMAL LOW (ref 3.87–5.11)
RDW: 17.2 % — ABNORMAL HIGH (ref 11.5–15.5)
WBC: 7.5 10*3/uL (ref 4.0–10.5)
nRBC: 0 % (ref 0.0–0.2)

## 2023-01-03 LAB — HEPATIC FUNCTION PANEL
ALT: 96 U/L — ABNORMAL HIGH (ref 0–44)
AST: 97 U/L — ABNORMAL HIGH (ref 15–41)
Albumin: 3.5 g/dL (ref 3.5–5.0)
Alkaline Phosphatase: 133 U/L — ABNORMAL HIGH (ref 38–126)
Bilirubin, Direct: 1 mg/dL — ABNORMAL HIGH (ref 0.0–0.2)
Indirect Bilirubin: 1.8 mg/dL — ABNORMAL HIGH (ref 0.3–0.9)
Total Bilirubin: 2.8 mg/dL — ABNORMAL HIGH (ref 0.3–1.2)

## 2023-01-03 LAB — BASIC METABOLIC PANEL
Anion gap: 10 (ref 5–15)
Anion gap: 12 (ref 5–15)
BUN: 31 mg/dL — ABNORMAL HIGH (ref 6–20)
BUN: 25 mg/dL — ABNORMAL HIGH (ref 6–20)
CO2: 19 mmol/L — ABNORMAL LOW (ref 22–32)
CO2: 20 mmol/L — ABNORMAL LOW (ref 22–32)
Calcium: 8 mg/dL — ABNORMAL LOW (ref 8.9–10.3)
Calcium: 8.2 mg/dL — ABNORMAL LOW (ref 8.9–10.3)
Chloride: 101 mmol/L (ref 98–111)
Chloride: 101 mmol/L (ref 98–111)
Creatinine, Ser: 1.11 mg/dL — ABNORMAL HIGH (ref 0.44–1.00)
Creatinine, Ser: 1.34 mg/dL — ABNORMAL HIGH (ref 0.44–1.00)
GFR, Estimated: >60 mL/min (ref >60)
GFR, Estimated: 54 mL/min — ABNORMAL LOW (ref >60)
Glucose, Bld: 208 mg/dL — ABNORMAL HIGH (ref 70–99)
Glucose, Bld: 276 mg/dL — ABNORMAL HIGH (ref 70–99)
Potassium: 5.8 mmol/L — ABNORMAL HIGH (ref 3.5–5.1)
Potassium: 5.3 mmol/L — ABNORMAL HIGH (ref 3.5–5.1)
Sodium: 130 mmol/L — ABNORMAL LOW (ref 135–145)
Sodium: 133 mmol/L — ABNORMAL LOW (ref 135–145)

## 2023-01-03 LAB — LDL CHOLESTEROL, DIRECT: Direct LDL: 42 mg/dL (ref 0–99)

## 2023-01-03 LAB — PROTIME-INR
INR: 1 (ref 0.8–1.2)
Prothrombin Time: 13.8 s (ref 11.4–15.2)

## 2023-01-03 LAB — GLUCOSE, CAPILLARY
Glucose-Capillary: 236 mg/dL — ABNORMAL HIGH (ref 70–99)
Glucose-Capillary: 231 mg/dL — ABNORMAL HIGH (ref 70–99)

## 2023-01-03 LAB — MAGNESIUM: Magnesium: 2.4 mg/dL (ref 1.7–2.4)

## 2023-01-03 LAB — CBG MONITORING, ED
Glucose-Capillary: 274 mg/dL — ABNORMAL HIGH (ref 70–99)
Glucose-Capillary: 189 mg/dL — ABNORMAL HIGH (ref 70–99)

## 2023-01-03 LAB — APTT: aPTT: 34 s (ref 24–36)

## 2023-01-03 MED ORDER — HYDRALAZINE HCL 20 MG/ML IJ SOLN
10.0000 mg | Freq: Four times a day (QID) | INTRAMUSCULAR | Status: DC | PRN
  Administered 2023-01-03: 10 mg via INTRAVENOUS
  Filled 2023-01-03: qty 1

## 2023-01-03 MED ORDER — ONDANSETRON HCL 4 MG/2ML IJ SOLN
4.0000 mg | Freq: Once | INTRAMUSCULAR | Status: AC
  Administered 2023-01-03: 4 mg via INTRAVENOUS
  Filled 2023-01-03: qty 2

## 2023-01-03 MED ORDER — TOPIRAMATE 25 MG PO TABS
50.0000 mg | ORAL_TABLET | Freq: Two times a day (BID) | ORAL | Status: DC
Start: 2023-01-03 — End: 2023-01-04
  Administered 2023-01-03 – 2023-01-04 (×2): 50 mg via ORAL
  Filled 2023-01-03 (×2): qty 2

## 2023-01-03 MED ORDER — PANTOPRAZOLE SODIUM 40 MG PO TBEC
40.0000 mg | DELAYED_RELEASE_TABLET | Freq: Every day | ORAL | Status: DC
  Administered 2023-01-03: 40 mg via ORAL
  Filled 2023-01-03: qty 1

## 2023-01-03 MED ORDER — DULOXETINE HCL 30 MG PO CPEP
60.0000 mg | ORAL_CAPSULE | Freq: Every day | ORAL | Status: DC
  Administered 2023-01-03: 60 mg via ORAL
  Filled 2023-01-03: qty 2

## 2023-01-03 MED ORDER — HYDROMORPHONE HCL 1 MG/ML IJ SOLN: 0.5000 mg | INTRAMUSCULAR | Status: DC | PRN

## 2023-01-03 MED ORDER — PREGABALIN 75 MG PO CAPS
300.0000 mg | ORAL_CAPSULE | Freq: Every day | ORAL | Status: DC
Start: 2023-01-03 — End: 2023-01-04
  Administered 2023-01-03: 300 mg via ORAL
  Filled 2023-01-03: qty 4

## 2023-01-03 MED ORDER — SODIUM BICARBONATE 650 MG PO TABS: 650.0000 mg | ORAL_TABLET | Freq: Two times a day (BID) | ORAL | Status: DC

## 2023-01-03 MED ORDER — HYDROMORPHONE HCL 1 MG/ML IJ SOLN
0.5000 mg | INTRAMUSCULAR | Status: AC | PRN
  Administered 2023-01-03 – 2023-01-04 (×4): 0.5 mg via INTRAVENOUS
  Filled 2023-01-03 (×2): qty 0.5
  Filled 2023-01-03: qty 1
  Filled 2023-01-03: qty 0.5

## 2023-01-03 MED ORDER — CARVEDILOL 3.125 MG PO TABS
3.1250 mg | ORAL_TABLET | Freq: Two times a day (BID) | ORAL | Status: DC
  Administered 2023-01-03 – 2023-01-04 (×2): 3.125 mg via ORAL
  Filled 2023-01-03 (×2): qty 1

## 2023-01-03 MED ORDER — PROCHLORPERAZINE EDISYLATE 10 MG/2ML IJ SOLN
5.0000 mg | Freq: Once | INTRAMUSCULAR | Status: AC | PRN
  Administered 2023-01-03: 5 mg via INTRAVENOUS
  Filled 2023-01-03: qty 2

## 2023-01-03 MED ORDER — INSULIN GLARGINE-YFGN 100 UNIT/ML ~~LOC~~ SOLN
10.0000 [IU] | Freq: Every day | SUBCUTANEOUS | Status: DC
  Administered 2023-01-03 – 2023-01-04 (×2): 10 [IU] via SUBCUTANEOUS
  Filled 2023-01-03 (×2): qty 0.1

## 2023-01-03 MED ORDER — OXYCODONE HCL 5 MG PO TABS
5.0000 mg | ORAL_TABLET | ORAL | Status: DC | PRN
  Administered 2023-01-03: 5 mg via ORAL
  Administered 2023-01-03: 10 mg via ORAL
  Administered 2023-01-03: 5 mg via ORAL
  Administered 2023-01-04: 10 mg via ORAL
  Filled 2023-01-03: qty 1
  Filled 2023-01-03: qty 2
  Filled 2023-01-03: qty 1
  Filled 2023-01-03: qty 2

## 2023-01-03 MED ORDER — AMITRIPTYLINE HCL 10 MG PO TABS
10.0000 mg | ORAL_TABLET | Freq: Every day | ORAL | Status: DC
  Administered 2023-01-03: 10 mg via ORAL
  Filled 2023-01-03: qty 1

## 2023-01-03 MED ORDER — HYDRALAZINE HCL 25 MG PO TABS
25.0000 mg | ORAL_TABLET | Freq: Three times a day (TID) | ORAL | Status: DC
  Administered 2023-01-03 – 2023-01-04 (×3): 25 mg via ORAL
  Filled 2023-01-03 (×4): qty 1

## 2023-01-03 MED ORDER — IVABRADINE HCL 7.5 MG PO TABS
7.5000 mg | ORAL_TABLET | Freq: Two times a day (BID) | ORAL | Status: DC
  Administered 2023-01-03 – 2023-01-04 (×2): 7.5 mg via ORAL
  Filled 2023-01-03 (×3): qty 1

## 2023-01-03 MED ORDER — LEVOTHYROXINE SODIUM 137 MCG PO TABS
137.0000 ug | ORAL_TABLET | Freq: Every day | ORAL | Status: DC
  Administered 2023-01-04: 137 ug via ORAL
  Filled 2023-01-03: qty 1

## 2023-01-03 MED ORDER — LACTATED RINGERS IV SOLN: INTRAVENOUS | Status: DC

## 2023-01-03 MED ORDER — HYDROMORPHONE HCL 1 MG/ML IJ SOLN
0.5000 mg | Freq: Once | INTRAMUSCULAR | Status: AC | PRN
  Administered 2023-01-03: 0.5 mg via INTRAVENOUS
  Filled 2023-01-03: qty 1

## 2023-01-03 MED ORDER — DIPHENHYDRAMINE HCL 25 MG PO CAPS
25.0000 mg | ORAL_CAPSULE | Freq: Four times a day (QID) | ORAL | Status: DC | PRN
  Administered 2023-01-03: 25 mg via ORAL
  Filled 2023-01-03: qty 1

## 2023-01-03 MED ORDER — PROCHLORPERAZINE EDISYLATE 10 MG/2ML IJ SOLN
10.0000 mg | INTRAMUSCULAR | Status: DC | PRN
  Administered 2023-01-03 (×3): 10 mg via INTRAVENOUS
  Filled 2023-01-03 (×3): qty 2

## 2023-01-03 MED ORDER — LORATADINE 10 MG PO TABS
10.0000 mg | ORAL_TABLET | Freq: Every day | ORAL | Status: DC
Start: 2023-01-03 — End: 2023-01-04
  Administered 2023-01-03 – 2023-01-04 (×2): 10 mg via ORAL
  Filled 2023-01-03 (×2): qty 1

## 2023-01-03 MED ORDER — IVABRADINE HCL 7.5 MG PO TABS: 7.5000 mg | ORAL_TABLET | Freq: Two times a day (BID) | ORAL | Status: DC

## 2023-01-03 MED ORDER — DIPHENHYDRAMINE HCL 50 MG/ML IJ SOLN
12.5000 mg | Freq: Once | INTRAMUSCULAR | Status: AC
  Administered 2023-01-03: 12.5 mg via INTRAVENOUS
  Filled 2023-01-03: qty 1

## 2023-01-03 MED ORDER — AMOXICILLIN-POT CLAVULANATE 875-125 MG PO TABS
1.0000 | ORAL_TABLET | Freq: Two times a day (BID) | ORAL | Status: DC
  Administered 2023-01-03 – 2023-01-04 (×3): 1 via ORAL
  Filled 2023-01-03 (×3): qty 1

## 2023-01-03 NOTE — ED Notes (Addendum)
Pt is refusing all oral medication until she gets something for nausea. MD notified.

## 2023-01-03 NOTE — ED Notes (Signed)
ED TO INPATIENT HANDOFF REPORT  Name/Age/Gender Jennifer [name] 32 y.o. female  Code Status    Code Status Orders  (From admission, onward)           Start     Ordered   01/02/23 1848  Full code  Continuous       Question:  By:  Answer:  Consent: discussion documented in EHR   01/02/23 1848           Code Status History     Date Active Date Inactive Code Status Order ID Comments User Context   09/19/2022 0844 09/20/2022 1536 Full Code [id number]  Maryln Gottron, MD ED   09/07/2022 0818 09/13/2022 1546 Full Code [id number]  Bobette Mo, MD ED   06/15/2022 1500 06/16/2022 2056 Full Code [id number]  Emeline General, MD ED   05/29/2022 1401 06/13/2022 0058 Full Code [id number]  Lorin Glass, MD Inpatient   03/23/2022 1603 03/25/2022 1849 Full Code [id number]  Alba Cory, MD Inpatient   03/14/2022 2224 03/18/2022 1617 Full Code [id number]  Briscoe Deutscher, MD Inpatient   02/06/2022 2021 02/14/2022 0058 Full Code [id number]  Alberteen Sam, MD Inpatient   12/02/2021 1851 12/12/2021 2137 Full Code [id number]  Clydie Braun, MD Inpatient   08/26/2021 2251 09/02/2021 2012 Full Code [id number]  Anselm Jungling, DO Inpatient   08/14/2021 0318 08/17/2021 1739 Full Code [id number]  Briscoe Deutscher, MD Inpatient   08/05/2021 1021 08/05/2021 1907 Full Code [id number]  Dolores Patty, MD Inpatient   03/10/2021 1125 03/11/2021 1909 Full Code [id number]  Burnadette Pop, MD Inpatient   01/25/2021 0117 01/27/2021 2218 Full Code [id number]  Charlsie Quest, MD ED   11/23/2020 0354 12/06/2020 1645 Full Code [id number]  Chotiner, Claudean Severance, MD ED       Home/SNF/Other Home  Chief Complaint Enteritis [K52.9]  Level of Care/Admitting Diagnosis ED Disposition     ED Disposition  Admit   Condition  --   Comment  Hospital Area: [facility] [[id number]]  Level of Care: Telemetry [5]  Admit to tele based on following criteria: Complex arrhythmia  (Bradycardia/Tachycardia)  May admit patient to [facility] or [facility] if equivalent level of care is available:: No  Covid Evaluation: Symptomatic Person Under Investigation (PUI) or recent exposure (last 10 days) *Testing Required*  Diagnosis: Enteritis [616073]  Admitting Physician: Nolberto Hanlon [[id number]]  Attending Physician: Nolberto Hanlon [[id number]]  Certification:: I certify this patient will need inpatient services for at least 2 midnights  Expected Medical Readiness: 01/04/2023          Medical History Past Medical History:  Diagnosis Date   Afib (HCC) 05/12/2021   Brain tumor (HCC) 03/29/1995   astrocytoma   CHF (congestive heart failure) (HCC)    Cholesterosis    CKD (chronic kidney disease) stage 4, GFR 15-29 ml/min (HCC) 05/13/2021   DM (diabetes mellitus) (HCC) 10/10/2018   Fatty liver    HTN (hypertension) 10/10/2018   Hypothyroidism 10/10/2018   Lipoprotein deficiency    Lung disease    longevity long term   Pancreatitis    Polycystic ovary syndrome     Allergies Allergies  Allergen Reactions   Ketamine Other (See Comments)    In vegetative state for 15 minutes per pt   [place] Itching    Itchy throat  Itchy throat Itchy throat Itchy throat Itchy throat    Itchy throat  Itchy throat Itchy throat Itchy throat Itchy throat  Itchy throat   Morphine Itching, Rash and Hives    Sorethroat   Penicillins Hives, Itching and Rash    Has patient had a PCN reaction causing immediate rash, facial/tongue/throat swelling, SOB or lightheadedness with hypotension: Y Has patient had a PCN reaction causing severe rash involving mucus membranes or skin necrosis: Y Has patient had a PCN reaction that required hospitalization: N Has patient had a PCN reaction occurring within the last 10 years: Y   Clarithromycin Other (See Comments)    Stomach pain   Erythromycin    Fentanyl Nausea And Vomiting   Penicillin V Hives, Itching and Other (See Comments)    Gi upset,  also   Shellfish Allergy Other (See Comments)    Pt has never had shellfish but tested positive on allergy test. Pt states contrast in CT is okay   Prednisone Rash    IV Location/Drains/Wounds Patient Lines/Drains/Airways Status     Active Line/Drains/Airways     Name Placement date Placement time Site Days   Peripheral IV 01/02/23 20 G Anterior;Right Forearm 01/02/23  1110  Forearm  1            Labs/Imaging Results for orders placed or performed during the hospital encounter of 01/02/23 (from the past 48 hour(s))  CBG monitoring, ED     Status: Abnormal   Collection Time: 01/02/23 11:08 AM  Result Value Ref Range   Glucose-Capillary 368 (H) 70 - 99 mg/dL    Comment: Glucose reference range applies only to samples taken after fasting for at least 8 hours.  CBC with Differential     Status: Abnormal   Collection Time: 01/02/23 11:08 AM  Result Value Ref Range   WBC 8.1 4.0 - 10.5 K/uL   RBC 3.87 3.87 - 5.11 MIL/uL   Hemoglobin 11.8 (L) 12.0 - 15.0 g/dL   HCT 81.1 (L) 91.4 - 78.2 %   MCV 92.2 80.0 - 100.0 fL   MCH 30.5 26.0 - 34.0 pg   MCHC 33.1 30.0 - 36.0 g/dL    Comment: CORRECTED FOR LIPEMIA   RDW 17.2 (H) 11.5 - 15.5 %   Platelets 139 (L) 150 - 400 K/uL   nRBC 0.4 (H) 0.0 - 0.2 %   Neutrophils Relative % 58 %   Neutro Abs 4.8 1.7 - 7.7 K/uL   Lymphocytes Relative 32 %   Lymphs Abs 2.6 0.7 - 4.0 K/uL   Monocytes Relative 6 %   Monocytes Absolute 0.5 0.1 - 1.0 K/uL   Eosinophils Relative 2 %   Eosinophils Absolute 0.1 0.0 - 0.5 K/uL   Basophils Relative 1 %   Basophils Absolute 0.1 0.0 - 0.1 K/uL   Immature Granulocytes 1 %   Abs Immature Granulocytes 0.06 0.00 - 0.07 K/uL    Comment: Performed at [facility], [address], [place], [place] [address]  Comprehensive metabolic panel     Status: Abnormal   Collection Time: 01/02/23 11:08 AM  Result Value Ref Range   Sodium 135 135 - 145 mmol/L    Comment: POST-ULTRACENTRIFUGATION    Potassium 6.2 (H) 3.5 - 5.1 mmol/L    Comment: POST-ULTRACENTRIFUGATION   Chloride 103 98 - 111 mmol/L    Comment: POST-ULTRACENTRIFUGATION   CO2 15 (L) 22 - 32 mmol/L    Comment: POST-ULTRACENTRIFUGATION   Glucose, Bld 394 (H) 70 - 99 mg/dL    Comment: Glucose reference range applies only to samples taken after fasting for at least 8 hours. POST-ULTRACENTRIFUGATION  BUN 36 (H) 6 - 20 mg/dL    Comment: POST-ULTRACENTRIFUGATION   Creatinine, Ser 1.44 (H) 0.44 - 1.00 mg/dL    Comment: POST-ULTRACENTRIFUGATION   Calcium 9.7 8.9 - 10.3 mg/dL    Comment: POST-ULTRACENTRIFUGATION   Total Protein RESULTS UNAVAILABLE DUE TO INTERFERING SUBSTANCE 6.5 - 8.1 g/dL   Albumin 4.5 3.5 - 5.0 g/dL    Comment: POST-ULTRACENTRIFUGATION   AST 101 (H) 15 - 41 U/L    Comment: LIPEMIC SPECIMEN, RESULTS MAY BE AFFECTED.   ALT 127 (H) 0 - 44 U/L    Comment: LIPEMIC SPECIMEN, RESULTS MAY BE AFFECTED.   Alkaline Phosphatase 182 (H) 38 - 126 U/L   Total Bilirubin 1.7 (H) 0.3 - 1.2 mg/dL   GFR, Estimated 50 (L) >60 mL/min    Comment: (NOTE) Calculated using the CKD-EPI Creatinine Equation (2021)    Anion gap 17 (H) 5 - 15    Comment: Performed at [facility], [address], [place], [place] [address]  Lipase, blood     Status: None   Collection Time: 01/02/23 11:08 AM  Result Value Ref Range   Lipase 25 11 - 51 U/L    Comment: POST-ULTRACENTRIFUGATION Performed at [facility], [address], [place], [place] [address]   hCG, quantitative, pregnancy     Status: None   Collection Time: 01/02/23 11:08 AM  Result Value Ref Range   hCG, Beta Chain, Quant, S <1 <5 mIU/mL    Comment:          GEST. AGE      CONC.  (mIU/mL)   <=1 WEEK        5 - 50     2 WEEKS       50 - 500     3 WEEKS       100 - 10,000     4 WEEKS     1,000 - 30,000     5 WEEKS     3,500 - 115,000   6-8 WEEKS     12,000 - 270,000    12 WEEKS     15,000 - 220,000        FEMALE AND NON-PREGNANT FEMALE:     LESS THAN  5 mIU/mL Performed at [facility], [address], [place], [place] [address]   Lipid panel     Status: Abnormal   Collection Time: 01/02/23 11:08 AM  Result Value Ref Range   Cholesterol 911 (H) 0 - 200 mg/dL   Triglycerides >2,130 (H) <150 mg/dL    Comment: RESULTS CONFIRMED BY MANUAL DILUTION   HDL 30 (L) >40 mg/dL   Total CHOL/HDL Ratio NOT REPORTED DUE TO HIGH TRIGLYCERIDES RATIO   VLDL UNABLE TO CALCULATE IF TRIGLYCERIDE OVER 400 mg/dL 0 - 40 mg/dL   LDL Cholesterol UNABLE TO CALCULATE IF TRIGLYCERIDE OVER 400 mg/dL 0 - 99 mg/dL    Comment: Performed at [facility], [address], [place], [place] [address]  LDL cholesterol, direct     Status: None   Collection Time: 01/02/23 11:08 AM  Result Value Ref Range   Direct LDL 42 0 - 99 mg/dL    Comment: HEMOLYSIS AT THIS LEVEL MAY AFFECT RESULT Performed at [facility], [address], [place], [place] [address]   Blood gas, venous (at [facility])     Status: Abnormal   Collection Time: 01/02/23 11:37 AM  Result Value Ref Range   pH,  Ven 7.31 7.25 - 7.43   pCO2, Ven 41 (L) 44 - 60 mmHg   pO2, Ven 42 32 - 45 mmHg   Bicarbonate 20.6 20.0 - 28.0 mmol/L   Acid-base deficit 5.3 (H) 0.0 - 2.0 mmol/L   O2 Saturation 70.1 %   Patient temperature 37.0     Comment: Performed at [facility], [address], [place], [place] [address]  I-stat chem 8, ED (not at [facility], [facility] or [facility])     Status: Abnormal   Collection Time: 01/02/23  2:38 PM  Result Value Ref Range   Sodium 134 (L) 135 - 145 mmol/L   Potassium 6.4 (HH) 3.5 - 5.1 mmol/L   Chloride 113 (H) 98 - 111 mmol/L   BUN 43 (H) 6 - 20 mg/dL   Creatinine, Ser 5.28 (H) 0.44 - 1.00 mg/dL   Glucose, Bld 413 (H) 70 - 99 mg/dL    Comment: Glucose reference range applies only to samples taken after fasting for at least 8 hours.   Calcium, Ion 1.21 1.15 - 1.40 mmol/L   TCO2 20 (L) 22 - 32 mmol/L   Hemoglobin 11.2 (L) 12.0 -  15.0 g/dL   HCT 24.4 (L) 01.0 - 27.2 %   Comment NOTIFIED PHYSICIAN   CBG monitoring, ED     Status: Abnormal   Collection Time: 01/02/23  4:24 PM  Result Value Ref Range   Glucose-Capillary 206 (H) 70 - 99 mg/dL    Comment: Glucose reference range applies only to samples taken after fasting for at least 8 hours.  SARS Coronavirus 2 by RT PCR (hospital order, performed in [facility] hospital lab) *cepheid single result test* Anterior Nasal Swab     Status: None   Collection Time: 01/02/23  7:40 PM   Specimen: Anterior Nasal Swab  Result Value Ref Range   SARS Coronavirus 2 by RT PCR NEGATIVE NEGATIVE    Comment: (NOTE) SARS-CoV-2 target nucleic acids are NOT DETECTED.  The SARS-CoV-2 RNA is generally detectable in upper and lower respiratory specimens during the acute phase of infection. The lowest concentration of SARS-CoV-2 viral copies this assay can detect is 250 copies / mL. A negative result does not preclude SARS-CoV-2 infection and should not be used as the sole basis for treatment or other patient management decisions.  A negative result may occur with improper specimen collection / handling, submission of specimen other than nasopharyngeal swab, presence of viral mutation(s) within the areas targeted by this assay, and inadequate number of viral copies (<250 copies / mL). A negative result must be combined with clinical observations, patient history, and epidemiological information.  Fact Sheet for Patients:   RoadLapTop.co.za  Fact Sheet for Healthcare Providers: http://kim-miller.com/  This test is not yet approved or  cleared by the [place] FDA and has been authorized for detection and/or diagnosis of SARS-CoV-2 by FDA under an Emergency Use Authorization (EUA).  This EUA will remain in effect (meaning this test can be used) for the duration of the COVID-19 declaration under Section 564(b)(1) of the Act, 21  U.S.C. section 360bbb-3(b)(1), unless the authorization is terminated or revoked sooner.  Performed at [facility], [address] [place], [place] [address]   Magnesium     Status: None   Collection Time: 01/02/23  7:40 PM  Result Value Ref Range   Magnesium 2.4 1.7 - 2.4 mg/dL    Comment: POST-ULTRACENTRIFUGATION HEMOLYSIS AT THIS LEVEL MAY AFFECT RESULT Performed at [facility], [address], [place], [place] [address]   Basic metabolic panel     Status: Abnormal   Collection Time: 01/02/23  7:40 PM  Result Value Ref Range   Sodium 130 (L) 135 - 145 mmol/L   Potassium 5.8 (H) 3.5 - 5.1 mmol/L    Comment: HEMOLYSIS AT THIS LEVEL MAY AFFECT RESULT   Chloride 101 98 - 111 mmol/L   CO2 19 (L) 22 - 32 mmol/L   Glucose, Bld 208 (H) 70 - 99 mg/dL    Comment: Glucose reference range applies only to samples taken after fasting for at least 8 hours.   BUN 31 (H) 6 - 20 mg/dL    Comment: POST-ULTRACENTRIFUGATION   Creatinine, Ser 1.11 (H) 0.44 - 1.00 mg/dL    Comment: POST-ULTRACENTRIFUGATION   Calcium 8.0 (L) 8.9 - 10.3 mg/dL   GFR, Estimated >47 >82 mL/min    Comment: (NOTE) Calculated using the CKD-EPI Creatinine Equation (2021)    Anion gap 10 5 - 15    Comment: Performed at [facility], [address] [place], [place] [address]  Hemoglobin A1c     Status: Abnormal   Collection Time: 01/02/23  7:40 PM  Result Value Ref Range   Hgb A1c MFr Bld 6.9 (H) 4.8 - 5.6 %    Comment: (NOTE) Pre diabetes:          5.7%-6.4%  Diabetes:              >6.4%  Glycemic control for   <7.0% adults with diabetes    Mean Plasma Glucose 151.33 mg/dL    Comment: Performed at [facility], [address] [place], [place] [address]  CBC     Status: Abnormal   Collection Time: 01/02/23  7:40 PM  Result Value Ref Range   WBC 7.8 4.0 - 10.5 K/uL   RBC 3.34 (L) 3.87 - 5.11 MIL/uL   Hemoglobin 11.4 (L) 12.0 - 15.0 g/dL   HCT 78.4 (L) 69.6 - 29.5 %    MCV 92.5 80.0 - 100.0 fL   MCH 34.1 (H) 26.0 - 34.0 pg   MCHC 36.9 (H) 30.0 - 36.0 g/dL   RDW 28.4 (H) 13.2 - 44.0 %   Platelets 142 (L) 150 - 400 K/uL   nRBC 0.0 0.0 - 0.2 %    Comment: Performed at [facility], [address], [place], [place] [address]  Urinalysis, Complete w Microscopic -Urine, Clean Catch     Status: Abnormal   Collection Time: 01/02/23  7:47 PM  Result Value Ref Range   Color, Urine YELLOW YELLOW   APPearance CLEAR CLEAR   Specific Gravity, Urine 1.032 (H) 1.005 - 1.030   pH 5.0 5.0 - 8.0   Glucose, UA 50 (A) NEGATIVE mg/dL   Hgb urine dipstick MODERATE (A) NEGATIVE   Bilirubin Urine NEGATIVE NEGATIVE   Ketones, ur NEGATIVE NEGATIVE mg/dL   Protein, ur 536 (A) NEGATIVE mg/dL   Nitrite NEGATIVE NEGATIVE   Leukocytes,Ua SMALL (A) NEGATIVE   RBC / HPF 11-20 0 - 5 RBC/hpf   WBC, UA 21-50 0 - 5 WBC/hpf   Bacteria, UA NONE SEEN NONE SEEN   Squamous Epithelial / HPF 0-5 0 - 5 /HPF   Mucus PRESENT     Comment: Performed at [facility], [address] [place], [place] [address]  CBG monitoring, ED     Status: Abnormal   Collection Time: 01/02/23 11:09 PM  Result  Value Ref Range   Glucose-Capillary 336 (H) 70 - 99 mg/dL    Comment: Glucose reference range applies only to samples taken after fasting for at least 8 hours.  APTT     Status: None   Collection Time: 01/03/23  5:20 AM  Result Value Ref Range   aPTT 34 24 - 36 seconds    Comment: Performed at [facility], [address] [place], [place] [address]  Protime-INR     Status: None   Collection Time: 01/03/23  5:20 AM  Result Value Ref Range   Prothrombin Time 13.8 11.4 - 15.2 seconds   INR 1.0 0.8 - 1.2    Comment: (NOTE) INR goal varies based on device and disease states. Performed at [facility], [address] [place], [place] [address]   Basic metabolic panel     Status: Abnormal   Collection Time: 01/03/23  5:20 AM   Result Value Ref Range   Sodium 133 (L) 135 - 145 mmol/L    Comment: POST-ULTRACENTRIFUGATION   Potassium 5.3 (H) 3.5 - 5.1 mmol/L    Comment: POST-ULTRACENTRIFUGATION HEMOLYSIS AT THIS LEVEL MAY AFFECT RESULT    Chloride 101 98 - 111 mmol/L    Comment: POST-ULTRACENTRIFUGATION   CO2 20 (L) 22 - 32 mmol/L    Comment: POST-ULTRACENTRIFUGATION   Glucose, Bld 276 (H) 70 - 99 mg/dL    Comment: Glucose reference range applies only to samples taken after fasting for at least 8 hours. POST-ULTRACENTRIFUGATION    BUN 25 (H) 6 - 20 mg/dL   Creatinine, Ser 8.65 (H) 0.44 - 1.00 mg/dL   Calcium 8.2 (L) 8.9 - 10.3 mg/dL    Comment: POST-ULTRACENTRIFUGATION   GFR, Estimated 54 (L) >60 mL/min    Comment: (NOTE) Calculated using the CKD-EPI Creatinine Equation (2021)    Anion gap 12 5 - 15    Comment: Performed at [facility], [address], [place], [place] [address]  CBC     Status: Abnormal   Collection Time: 01/03/23  5:20 AM  Result Value Ref Range   WBC 7.5 4.0 - 10.5 K/uL   RBC 3.11 (L) 3.87 - 5.11 MIL/uL   Hemoglobin 10.4 (L) 12.0 - 15.0 g/dL   HCT 62.9 (L) 52.8 - 41.3 %   MCV 92.6 80.0 - 100.0 fL   MCH 33.4 26.0 - 34.0 pg   MCHC 36.1 (H) 30.0 - 36.0 g/dL   RDW 24.4 (H) 01.0 - 27.2 %   Platelets 138 (L) 150 - 400 K/uL   nRBC 0.0 0.0 - 0.2 %    Comment: Performed at [facility], [address], [place], [place] [address]  Hepatitis panel, acute     Status: None   Collection Time: 01/03/23  5:20 AM  Result Value Ref Range   Hepatitis B Surface Ag NON REACTIVE NON REACTIVE   HCV Ab NON REACTIVE NON REACTIVE    Comment: (NOTE) Nonreactive HCV antibody screen is consistent with no HCV infections,  unless recent infection is suspected or other evidence exists to indicate HCV infection.     Hep A IgM NON REACTIVE NON REACTIVE   Hep B C IgM NON REACTIVE NON REACTIVE    Comment: Performed at [facility], [address], [place], [place]  [address]  Hepatic function panel     Status: Abnormal   Collection Time: 01/03/23  5:20 AM  Result Value Ref Range   Total Protein RESULTS  UNAVAILABLE DUE TO INTERFERING SUBSTANCE 6.5 - 8.1 g/dL   Albumin 3.5 3.5 - 5.0 g/dL   AST 97 (H) 15 - 41 U/L    Comment: HEMOLYSIS AT THIS LEVEL MAY AFFECT RESULT   ALT 96 (H) 0 - 44 U/L    Comment: HEMOLYSIS AT THIS LEVEL MAY AFFECT RESULT   Alkaline Phosphatase 133 (H) 38 - 126 U/L    Comment: HEMOLYSIS AT THIS LEVEL MAY AFFECT RESULT   Total Bilirubin 2.8 (H) 0.3 - 1.2 mg/dL    Comment: POST-ULTRACENTRIFUGATION HEMOLYSIS AT THIS LEVEL MAY AFFECT RESULT    Bilirubin, Direct 1.0 (H) 0.0 - 0.2 mg/dL    Comment: HEMOLYSIS AT THIS LEVEL MAY AFFECT RESULT   Indirect Bilirubin 1.8 (H) 0.3 - 0.9 mg/dL    Comment: Performed at [facility], [address] [place], [place] [address]  CBG monitoring, ED     Status: Abnormal   Collection Time: 01/03/23  8:14 AM  Result Value Ref Range   Glucose-Capillary 274 (H) 70 - 99 mg/dL    Comment: Glucose reference range applies only to samples taken after fasting for at least 8 hours.  CBG monitoring, ED     Status: Abnormal   Collection Time: 01/03/23 12:46 PM  Result Value Ref Range   Glucose-Capillary 189 (H) 70 - 99 mg/dL    Comment: Glucose reference range applies only to samples taken after fasting for at least 8 hours.   US Abdomen Limited RUQ (LIVER/GB)  Result Date: 01/02/2023 CLINICAL DATA:  Elevated liver function tests. EXAM: ULTRASOUND ABDOMEN LIMITED RIGHT UPPER QUADRANT COMPARISON:  February 07, 2022 FINDINGS: Gallbladder: No gallstones or wall thickening visualized (1.6 mm). No sonographic Murphy sign noted by sonographer. Common bile duct: Diameter: 4.2 mm Liver: The liver is enlarged and measures 23.1 cm in length. No focal lesion identified. Diffusely increased echogenicity of the liver parenchyma is noted. Portal vein is patent on color Doppler imaging with normal direction of blood flow  towards the liver. Other: None. IMPRESSION: Hepatomegaly and hepatic steatosis, without focal liver lesions. Electronically Signed   By: Aram Candela M.D.   On: 01/02/2023 20:29   CT ABDOMEN PELVIS W CONTRAST  Result Date: 01/02/2023 CLINICAL DATA:  Abdominal pain, diarrhea, nausea and vomiting EXAM: CT ABDOMEN AND PELVIS WITH CONTRAST TECHNIQUE: Multidetector CT imaging of the abdomen and pelvis was performed using the standard protocol following bolus administration of intravenous contrast. RADIATION DOSE REDUCTION: This exam was performed according to the departmental dose-optimization program which includes automated exposure control, adjustment of the mA and/or kV according to patient size and/or use of iterative reconstruction technique. CONTRAST:  80mL OMNIPAQUE IOHEXOL 300 MG/ML  SOLN COMPARISON:  10/18/2022 FINDINGS: Lower chest: No acute abnormality. Linear metallic foreign body which appears to be within a subsegmental artery of the posterior segment of the left lower lobe, as seen by prior examination (series 2, image 1). Hepatobiliary: No solid liver abnormality is seen. Hepatomegaly, maximum coronal span 21.8 cm. Hepatic steatosis. No gallstones, gallbladder wall thickening, or biliary dilatation. Pancreas: Unremarkable. No pancreatic ductal dilatation or surrounding inflammatory changes. Spleen: Splenomegaly, maximum coronal span 14.1 cm. Adrenals/Urinary Tract: Adrenal glands are unremarkable. Kidneys are normal, without renal calculi, solid lesion, or hydronephrosis. Urinary bladder wall thickening and adjacent fat stranding (series 2, image 70). Stomach/Bowel: Stomach is within normal limits. Appendix appears normal. No evidence of bowel wall thickening, distention, or inflammatory changes. Vascular/Lymphatic: Severe aortic atherosclerosis. No enlarged abdominal or pelvic lymph nodes. Reproductive: No  mass or other significant abnormality. Small benign functional ovarian follicles,  requiring no further follow-up or characterization. Other: No abdominal wall hernia or abnormality. No ascites. Musculoskeletal: No acute or significant osseous findings. IMPRESSION: 1. Urinary bladder wall thickening and adjacent fat stranding, suggestive of nonspecific infectious or inflammatory cystitis. Correlate with urinalysis. 2. Hepatomegaly and hepatic steatosis 3. Linear metallic foreign body which appears to be within a subsegmental artery of the posterior segment of the left lower lobe, as seen by multiple prior examinations and of uncertain nature. 4. Severe abdominal aortic atherosclerosis markedly advanced for patient age. Aortic Atherosclerosis (ICD10-I70.0). Electronically Signed   By: Jearld Lesch M.D.   On: 01/02/2023 18:21    Pending Labs Unresulted Labs (From admission, onward)     Start     Ordered   01/09/23 0500  Creatinine, serum  (enoxaparin (LOVENOX)    CrCl >/= 30 ml/min)  Weekly,   R     Comments: while on enoxaparin therapy    01/02/23 1848   01/04/23 0500  CBC  Tomorrow morning,   R        01/03/23 1200   01/04/23 0500  Comprehensive metabolic panel  Tomorrow morning,   R        01/03/23 1200   01/02/23 1850  C Difficile Quick Screen w PCR reflex  (C Difficile quick screen w PCR reflex panel )  Once, for 24 hours,   TIMED       References:    CDiff Information Tool   01/02/23 1849   01/02/23 1850  Gastrointestinal Panel by PCR , Stool  (Gastrointestinal Panel by PCR, Stool                                                                                                                                                     **Does Not include CLOSTRIDIUM DIFFICILE testing. **If CDIFF testing is needed, place order from the "C Difficile Testing" order set.**)  Once,   R        01/02/23 1849            Vitals/Pain Today's Vitals   01/03/23 0700 01/03/23 0913 01/03/23 1053 01/03/23 1110  BP:   (!) 173/110   Pulse: (!) 109  (!) 106   Resp: 15  17   Temp:   98 F  (36.7 C)   TempSrc:   Oral   SpO2: 93%  95%   PainSc:  7   7     Isolation Precautions Enteric precautions (UV disinfection)  Medications Medications  enoxaparin (LOVENOX) injection 40 mg (40 mg Subcutaneous Given 01/03/23 0919)  polyethylene glycol (MIRALAX / GLYCOLAX) packet 17 g (has no administration in time range)  sodium chloride flush (NS) 0.9 % injection 3 mL (3 mLs Intravenous Given 01/03/23 0924)  acetaminophen (TYLENOL) tablet 650 mg (650  mg Oral Given 01/03/23 1035)  insulin aspart (novoLOG) injection 0-15 Units (8 Units Subcutaneous Given 01/03/23 0918)  insulin aspart (novoLOG) injection 0-5 Units (4 Units Subcutaneous Given 01/02/23 2316)  sodium bicarbonate tablet 650 mg (650 mg Oral Given 01/03/23 1035)  senna-docusate (Senokot-S) tablet 2 tablet (2 tablets Oral Given 01/03/23 1035)  aspirin EC tablet 81 mg (81 mg Oral Given 01/03/23 0916)  albuterol (VENTOLIN HFA) 108 (90 Base) MCG/ACT inhaler 1-2 puff (has no administration in time range)  fenofibrate tablet 54 mg (54 mg Oral Given 01/03/23 1035)  rosuvastatin (CRESTOR) tablet 40 mg (40 mg Oral Given 01/03/23 0915)  lipase/protease/amylase (CREON) capsule 36,000 Units (36,000 Units Oral Given 01/03/23 0916)  oxyCODONE (Oxy IR/ROXICODONE) immediate release tablet 5-10 mg (5 mg Oral Given 01/03/23 0916)  amoxicillin-clavulanate (AUGMENTIN) 875-125 MG per tablet 1 tablet (1 tablet Oral Given 01/03/23 1035)  lactated ringers infusion ( Intravenous New Bag/Given 01/03/23 1041)  pantoprazole (PROTONIX) EC tablet 40 mg (has no administration in time range)  prochlorperazine (COMPAZINE) injection 10 mg (has no administration in time range)  HYDROmorphone (DILAUDID) injection 0.5 mg (0.5 mg Intravenous Given 01/03/23 1108)  diphenhydrAMINE (BENADRYL) capsule 25 mg (25 mg Oral Given 01/03/23 1108)  amitriptyline (ELAVIL) tablet 10 mg (has no administration in time range)  carvedilol (COREG) tablet 3.125 mg (has no administration in time  range)  DULoxetine (CYMBALTA) DR capsule 60 mg (has no administration in time range)  hydrALAZINE (APRESOLINE) tablet 25 mg (has no administration in time range)  ivabradine (CORLANOR) tablet 7.5 mg (has no administration in time range)  levocetirizine (XYZAL) tablet 5 mg (has no administration in time range)  levothyroxine (SYNTHROID) tablet 137 mcg (has no administration in time range)  pregabalin (LYRICA) capsule 300 mg (has no administration in time range)  topiramate (TOPAMAX) tablet 50 mg (has no administration in time range)  insulin glargine-yfgn (SEMGLEE) injection 10 Units (has no administration in time range)  sodium chloride 0.9 % bolus 1,000 mL (0 mLs Intravenous Stopped 01/02/23 1417)  ondansetron (ZOFRAN) injection 4 mg (4 mg Intravenous Given 01/02/23 1137)  metoCLOPramide (REGLAN) injection 10 mg (10 mg Intravenous Given 01/02/23 1136)  pantoprazole (PROTONIX) injection 40 mg (40 mg Intravenous Given 01/02/23 1136)  HYDROmorphone (DILAUDID) injection 0.5 mg (0.5 mg Intravenous Given 01/02/23 1147)  metoCLOPramide (REGLAN) injection 10 mg (10 mg Intravenous Given 01/02/23 1444)  HYDROmorphone (DILAUDID) injection 0.5 mg (0.5 mg Intravenous Given 01/02/23 1444)  sodium chloride 0.9 % bolus 1,000 mL (0 mLs Intravenous Stopped 01/02/23 1900)  insulin aspart (novoLOG) injection 10 Units (10 Units Intravenous Given 01/02/23 1528)  sodium bicarbonate injection 50 mEq (50 mEq Intravenous Given 01/02/23 1528)  HYDROmorphone (DILAUDID) injection 1 mg (1 mg Intravenous Given 01/02/23 1539)  diphenhydrAMINE (BENADRYL) injection 25 mg (25 mg Intravenous Given 01/02/23 1651)  iohexol (OMNIPAQUE) 300 MG/ML solution 100 mL (80 mLs Intravenous Contrast Given 01/02/23 1721)  sodium zirconium cyclosilicate (LOKELMA) packet 10 g (10 g Oral Given 01/02/23 1943)  prochlorperazine (COMPAZINE) injection 5 mg (5 mg Intravenous Given 01/03/23 0320)  HYDROmorphone (DILAUDID) injection 0.5 mg (0.5 mg Intravenous Given  01/03/23 0458)  diphenhydrAMINE (BENADRYL) injection 12.5 mg (12.5 mg Intravenous Given 01/03/23 0533)  ondansetron (ZOFRAN) injection 4 mg (4 mg Intravenous Given 01/03/23 1029)    Mobility walks

## 2023-01-03 NOTE — Progress Notes (Signed)
[facility] PROGRESS NOTE   Jennifer [name] GNF:[id number] DOB: 14-Aug-1990 DOA: 01/02/2023  PCP: Nathaneil Canary, PA-C  Brief History: 32 y.o. female with medical history significant of hyper cholesterolemia associated with prior episode of chronic pancreatic disease.  Patient also has had prior brain tumor complicated by right-sided ptosis as well as left upper extremity weakness.  Patient presented with complaints of diarrhea epigastric abdominal pain with nausea and vomiting.  She was hospitalized for further management.   Consultants: None yet  Procedures: None    Subjective/Interval History: Patient mentions that she is feeling better today compared to yesterday.  Has not vomited since yesterday afternoon.    Assessment/Plan:  Syncope Likely due to hypovolemia from her GI symptoms.  EKG did not show any acute findings.  Check orthostatic vital signs.  Monitor on telemetry for now.  No clear indication to do an echocardiogram.  She actually had an echo back in July which showed low normal systolic function.  Mobilize.  Nausea vomiting and diarrhea Could have gastroenteritis.  Continue supportive treatment.  Stool studies ordered and pending. Symptoms appear to have improved.  Advance diet. Continue PPI. Lipase level was normal.  Abnormal LFTs noted.  See below.  Diabetes mellitus type 2, uncontrolled with hyperglycemia Initial labs did suggest mild DKA.  This appears to have improved.  Did not receive IV insulin.  Continue Lantus with SSI.  Will also add long-acting insulin.  Monitor CBGs.  Hyperkalemia Has improved.  Recheck labs tomorrow.  Spironolactone on hold.  She was given Lokelma.  Hyponatremia Stable.  Abnormal LFTs There is an element of chronicity to her elevated AST and ALT.  Ultrasound did not show any acute findings in the hepatobiliary area.  Hepatic steatosis and hepatomegaly was noted.  [name] nontender in the right upper quadrant.  Monitor  periodically.  Hepatitis panel unremarkable.  Chronic kidney disease stage IV Renal function stable  Hypertriglyceridemia Triglycerides noted to be significantly elevated.  [name] on fenofibrate and statin.  Outpatient management.  Compliance is questionable.  Bladder wall thickening/recent UTI Incidentally noted on CT scan.  UA is nonspecific.  Patient denies any dysuria but did mention that she was recently diagnosed with a UTI and has been on Augmentin which will be continued.  Normocytic anemia Hemoglobin stable for the most part.  Mild drop is likely dilutional.  DVT Prophylaxis: Lovenox Code Status: Full code Family Communication: Discussed with patient Disposition Plan: Return home when improved  Status is: Inpatient Remains inpatient appropriate because: nausea.  Electrolyte abnormalities.  Hyperglycemia      Medications: Scheduled:  acetaminophen  650 mg Oral Q4H   amoxicillin-clavulanate  1 tablet Oral Q12H   aspirin EC  81 mg Oral Daily   enoxaparin (LOVENOX) injection  40 mg Subcutaneous Q24H   fenofibrate  54 mg Oral Daily   insulin aspart  0-15 Units Subcutaneous TID WC   insulin aspart  0-5 Units Subcutaneous QHS   lipase/protease/amylase  36,000 Units Oral TID WC   pantoprazole  40 mg Oral QHS   rosuvastatin  40 mg Oral Daily   senna-docusate  2 tablet Oral BID   sodium bicarbonate  650 mg Oral TID   sodium chloride flush  3 mL Intravenous Q12H   Continuous:  lactated ringers 75 mL/hr at 01/03/23 1041   QIO:[id number], diphenhydrAMINE, HYDROmorphone (DILAUDID) injection, oxyCODONE, polyethylene glycol, prochlorperazine  Antibiotics: Anti-infectives (From admission, onward)    Start     Dose/Rate Route Frequency Ordered Stop  01/03/23 1000  amoxicillin-clavulanate (AUGMENTIN) 875-125 MG per tablet 1 tablet        1 tablet Oral Every 12 hours 01/03/23 0845 01/06/23 0959       Objective:  Vital Signs  Vitals:   01/03/23 0430 01/03/23 0635  01/03/23 0700 01/03/23 1053  BP: (!) 155/96 (!) 174/103  (!) 173/110  Pulse: (!) 116 (!) 111 (!) 109 (!) 106  Resp: 16 16 15 17   Temp:  97.6 F (36.4 C)  98 F (36.7 C)  TempSrc:  Oral  Oral  SpO2: (!) 85% 92% 93% 95%    Intake/Output Summary (Last 24 hours) at 01/03/2023 1143 Last data filed at 01/03/2023 0600 Gross per 24 hour  Intake 1999 ml  Output --  Net 1999 ml   There were no vitals filed for this visit.  General appearance: Awake alert.  In no distress Resp: Clear to auscultation bilaterally.  Normal effort Cardio: S1-S2 is normal regular.  No S3-S4.  No rubs murmurs or bruit GI: Abdomen is soft.  Mildly tender in the epigastric area without any rebound rigidity or guarding.  No masses organomegaly. Extremities: No edema.  Full range of motion of lower extremities. Neurologic: Alert and oriented x3.  No focal neurological deficits.    Lab Results:  Data Reviewed: I have personally reviewed following labs and reports of the imaging studies  CBC: Recent Labs  Lab 01/02/23 1108 01/02/23 1438 01/02/23 1940 01/03/23 0520  WBC 8.1  --  7.8 7.5  NEUTROABS 4.8  --   --   --   HGB 11.8* 11.2* 11.4* 10.4*  HCT 35.7* 33.0* 30.9* 28.8*  MCV 92.2  --  92.5 92.6  PLT 139*  --  142* 138*    Basic Metabolic Panel: Recent Labs  Lab 01/02/23 1108 01/02/23 1438 01/02/23 1940 01/03/23 0520  NA 135 134* 130* 133*  K 6.2* 6.4* 5.8* 5.3*  CL 103 113* 101 101  CO2 15*  --  19* 20*  GLUCOSE 394* 351* 208* 276*  BUN 36* 43* 31* 25*  CREATININE 1.44* 1.50* 1.11* 1.34*  CALCIUM 9.7  --  8.0* 8.2*  MG  --   --  2.4  --     GFR: CrCl cannot be calculated (Unknown ideal weight.).  Liver Function Tests: Recent Labs  Lab 01/02/23 1108 01/03/23 0520  AST 101* 97*  ALT 127* 96*  ALKPHOS 182* 133*  BILITOT 1.7* 2.8*  PROT RESULTS UNAVAILABLE DUE TO INTERFERING SUBSTANCE RESULTS UNAVAILABLE DUE TO INTERFERING SUBSTANCE  ALBUMIN 4.5 3.5    Recent Labs  Lab  01/02/23 1108  LIPASE 25   Coagulation Profile: Recent Labs  Lab 01/03/23 0520  INR 1.0    HbA1C: Recent Labs    01/02/23 1940  HGBA1C 6.9*    CBG: Recent Labs  Lab 01/02/23 1108 01/02/23 1624 01/02/23 2309 01/03/23 0814  GLUCAP 368* 206* 336* 274*    Lipid Profile: Recent Labs    01/02/23 1108  CHOL 911*  HDL 30*  LDLCALC UNABLE TO CALCULATE IF TRIGLYCERIDE OVER 400 mg/dL  TRIG >1,610*  CHOLHDL NOT REPORTED DUE TO HIGH TRIGLYCERIDES  LDLDIRECT 42     Recent Results (from the past 240 hour(s))  SARS Coronavirus 2 by RT PCR (hospital order, performed in [facility] hospital lab) *cepheid single result test* Anterior Nasal Swab     Status: None   Collection Time: 01/02/23  7:40 PM   Specimen: Anterior Nasal Swab  Result Value Ref Range Status  SARS Coronavirus 2 by RT PCR NEGATIVE NEGATIVE Final    Comment: (NOTE) SARS-CoV-2 target nucleic acids are NOT DETECTED.  The SARS-CoV-2 RNA is generally detectable in upper and lower respiratory specimens during the acute phase of infection. The lowest concentration of SARS-CoV-2 viral copies this assay can detect is 250 copies / mL. A negative result does not preclude SARS-CoV-2 infection and should not be used as the sole basis for treatment or other patient management decisions.  A negative result may occur with improper specimen collection / handling, submission of specimen other than nasopharyngeal swab, presence of viral mutation(s) within the areas targeted by this assay, and inadequate number of viral copies (<250 copies / mL). A negative result must be combined with clinical observations, patient history, and epidemiological information.  Fact Sheet for Patients:   RoadLapTop.co.za  Fact Sheet for Healthcare Providers: http://kim-miller.com/  This test is not yet approved or  cleared by the [place] FDA and has been authorized for detection and/or  diagnosis of SARS-CoV-2 by FDA under an Emergency Use Authorization (EUA).  This EUA will remain in effect (meaning this test can be used) for the duration of the COVID-19 declaration under Section 564(b)(1) of the Act, 21 U.S.C. section 360bbb-3(b)(1), unless the authorization is terminated or revoked sooner.  Performed at [facility], [address] [place], [place] [address]       Radiology Studies: US Abdomen Limited RUQ (LIVER/GB)  Result Date: 01/02/2023 CLINICAL DATA:  Elevated liver function tests. EXAM: ULTRASOUND ABDOMEN LIMITED RIGHT UPPER QUADRANT COMPARISON:  February 07, 2022 FINDINGS: Gallbladder: No gallstones or wall thickening visualized (1.6 mm). No sonographic Murphy sign noted by sonographer. Common bile duct: Diameter: 4.2 mm Liver: The liver is enlarged and measures 23.1 cm in length. No focal lesion identified. Diffusely increased echogenicity of the liver parenchyma is noted. Portal vein is patent on color Doppler imaging with normal direction of blood flow towards the liver. Other: None. IMPRESSION: Hepatomegaly and hepatic steatosis, without focal liver lesions. Electronically Signed   By: Aram Candela M.D.   On: 01/02/2023 20:29   CT ABDOMEN PELVIS W CONTRAST  Result Date: 01/02/2023 CLINICAL DATA:  Abdominal pain, diarrhea, nausea and vomiting EXAM: CT ABDOMEN AND PELVIS WITH CONTRAST TECHNIQUE: Multidetector CT imaging of the abdomen and pelvis was performed using the standard protocol following bolus administration of intravenous contrast. RADIATION DOSE REDUCTION: This exam was performed according to the departmental dose-optimization program which includes automated exposure control, adjustment of the mA and/or kV according to patient size and/or use of iterative reconstruction technique. CONTRAST:  80mL OMNIPAQUE IOHEXOL 300 MG/ML  SOLN COMPARISON:  10/18/2022 FINDINGS: Lower chest: No acute abnormality. Linear metallic foreign body  which appears to be within a subsegmental artery of the posterior segment of the left lower lobe, as seen by prior examination (series 2, image 1). Hepatobiliary: No solid liver abnormality is seen. Hepatomegaly, maximum coronal span 21.8 cm. Hepatic steatosis. No gallstones, gallbladder wall thickening, or biliary dilatation. Pancreas: Unremarkable. No pancreatic ductal dilatation or surrounding inflammatory changes. Spleen: Splenomegaly, maximum coronal span 14.1 cm. Adrenals/Urinary Tract: Adrenal glands are unremarkable. Kidneys are normal, without renal calculi, solid lesion, or hydronephrosis. Urinary bladder wall thickening and adjacent fat stranding (series 2, image 70). Stomach/Bowel: Stomach is within normal limits. Appendix appears normal. No evidence of bowel wall thickening, distention, or inflammatory changes. Vascular/Lymphatic: Severe aortic atherosclerosis. No enlarged abdominal or pelvic lymph nodes. Reproductive: No mass or other significant abnormality. Small benign functional ovarian  follicles, requiring no further follow-up or characterization. Other: No abdominal wall hernia or abnormality. No ascites. Musculoskeletal: No acute or significant osseous findings. IMPRESSION: 1. Urinary bladder wall thickening and adjacent fat stranding, suggestive of nonspecific infectious or inflammatory cystitis. Correlate with urinalysis. 2. Hepatomegaly and hepatic steatosis 3. Linear metallic foreign body which appears to be within a subsegmental artery of the posterior segment of the left lower lobe, as seen by multiple prior examinations and of uncertain nature. 4. Severe abdominal aortic atherosclerosis markedly advanced for patient age. Aortic Atherosclerosis (ICD10-I70.0). Electronically Signed   By: Jearld Lesch M.D.   On: 01/02/2023 18:21       LOS: 1 day   Jennifer [name]  [facility] Pager on [email or web address]  01/03/2023, 11:43 AM

## 2023-01-03 NOTE — ED Notes (Signed)
Pt ambulatory to the restroom without difficulty.

## 2023-01-04 ENCOUNTER — Other Ambulatory Visit: Payer: Self-pay

## 2023-01-04 ENCOUNTER — Emergency Department (HOSPITAL_COMMUNITY)
Admission: EM | Admit: 2023-01-04 | Discharge: 2023-01-04 | Disposition: A | Discharge status: Home / Self Care | Payer: Medicaid Other | Attending: Emergency Medicine | Admitting: Emergency Medicine

## 2023-01-04 ENCOUNTER — Encounter (HOSPITAL_COMMUNITY): Payer: Self-pay

## 2023-01-04 DIAGNOSIS — W19XXXA Unspecified fall, initial encounter: Secondary | ICD-10-CM

## 2023-01-04 DIAGNOSIS — E119 Type 2 diabetes mellitus without complications: Secondary | ICD-10-CM | POA: Diagnosis not present

## 2023-01-04 DIAGNOSIS — S7001XA Contusion of right hip, initial encounter: Secondary | ICD-10-CM | POA: Insufficient documentation

## 2023-01-04 DIAGNOSIS — Z743 Need for continuous supervision: Secondary | ICD-10-CM | POA: Diagnosis not present

## 2023-01-04 DIAGNOSIS — S0990XA Unspecified injury of head, initial encounter: Secondary | ICD-10-CM | POA: Diagnosis not present

## 2023-01-04 DIAGNOSIS — I509 Heart failure, unspecified: Secondary | ICD-10-CM | POA: Insufficient documentation

## 2023-01-04 DIAGNOSIS — M25519 Pain in unspecified shoulder: Secondary | ICD-10-CM | POA: Insufficient documentation

## 2023-01-04 DIAGNOSIS — R52 Pain, unspecified: Secondary | ICD-10-CM | POA: Diagnosis not present

## 2023-01-04 DIAGNOSIS — R55 Syncope and collapse: Secondary | ICD-10-CM | POA: Diagnosis not present

## 2023-01-04 DIAGNOSIS — Z7982 Long term (current) use of aspirin: Secondary | ICD-10-CM | POA: Diagnosis not present

## 2023-01-04 DIAGNOSIS — Y93K1 Activity, walking an animal: Secondary | ICD-10-CM | POA: Insufficient documentation

## 2023-01-04 DIAGNOSIS — S0101XA Laceration without foreign body of scalp, initial encounter: Secondary | ICD-10-CM | POA: Diagnosis not present

## 2023-01-04 DIAGNOSIS — N189 Chronic kidney disease, unspecified: Secondary | ICD-10-CM | POA: Diagnosis not present

## 2023-01-04 DIAGNOSIS — Z794 Long term (current) use of insulin: Secondary | ICD-10-CM | POA: Diagnosis not present

## 2023-01-04 DIAGNOSIS — W108XXA Fall (on) (from) other stairs and steps, initial encounter: Secondary | ICD-10-CM | POA: Diagnosis not present

## 2023-01-04 DIAGNOSIS — I10 Essential (primary) hypertension: Secondary | ICD-10-CM | POA: Diagnosis not present

## 2023-01-04 DIAGNOSIS — I130 Hypertensive heart and chronic kidney disease with heart failure and stage 1 through stage 4 chronic kidney disease, or unspecified chronic kidney disease: Secondary | ICD-10-CM | POA: Insufficient documentation

## 2023-01-04 DIAGNOSIS — S0001XA Abrasion of scalp, initial encounter: Secondary | ICD-10-CM | POA: Diagnosis not present

## 2023-01-04 DIAGNOSIS — Z79899 Other long term (current) drug therapy: Secondary | ICD-10-CM | POA: Insufficient documentation

## 2023-01-04 LAB — COMPREHENSIVE METABOLIC PANEL
ALT: 84 U/L — ABNORMAL HIGH (ref 0–44)
ALT: 85 U/L — ABNORMAL HIGH (ref 0–44)
AST: 77 U/L — ABNORMAL HIGH (ref 15–41)
AST: 50 U/L — ABNORMAL HIGH (ref 15–41)
Albumin: 3.4 g/dL — ABNORMAL LOW (ref 3.5–5.0)
Albumin: 3.7 g/dL (ref 3.5–5.0)
Alkaline Phosphatase: 117 U/L (ref 38–126)
Alkaline Phosphatase: 125 U/L (ref 38–126)
Anion gap: 17 — ABNORMAL HIGH (ref 5–15)
Anion gap: 19 — ABNORMAL HIGH (ref 5–15)
BUN: 26 mg/dL — ABNORMAL HIGH (ref 6–20)
BUN: 28 mg/dL — ABNORMAL HIGH (ref 6–20)
CO2: 16 mmol/L — ABNORMAL LOW (ref 22–32)
CO2: 17 mmol/L — ABNORMAL LOW (ref 22–32)
Calcium: 8.4 mg/dL — ABNORMAL LOW (ref 8.9–10.3)
Calcium: 8.5 mg/dL — ABNORMAL LOW (ref 8.9–10.3)
Chloride: 102 mmol/L (ref 98–111)
Chloride: 102 mmol/L (ref 98–111)
Creatinine, Ser: 1.75 mg/dL — ABNORMAL HIGH (ref 0.44–1.00)
Creatinine, Ser: 1.96 mg/dL — ABNORMAL HIGH (ref 0.44–1.00)
GFR, Estimated: 39 mL/min — ABNORMAL LOW (ref >60)
GFR, Estimated: 34 mL/min — ABNORMAL LOW (ref >60)
Glucose, Bld: 291 mg/dL — ABNORMAL HIGH (ref 70–99)
Glucose, Bld: 323 mg/dL — ABNORMAL HIGH (ref 70–99)
Potassium: 5.1 mmol/L (ref 3.5–5.1)
Potassium: 4 mmol/L (ref 3.5–5.1)
Sodium: 135 mmol/L (ref 135–145)
Sodium: 138 mmol/L (ref 135–145)
Total Bilirubin: 1.6 mg/dL — ABNORMAL HIGH (ref 0.3–1.2)
Total Bilirubin: 0.8 mg/dL (ref 0.3–1.2)
Total Protein: 6.6 g/dL (ref 6.5–8.1)

## 2023-01-04 LAB — CBC
HCT: 30.5 % — ABNORMAL LOW (ref 36.0–46.0)
Hemoglobin: 10.7 g/dL — ABNORMAL LOW (ref 12.0–15.0)
MCH: 33.3 pg (ref 26.0–34.0)
MCHC: 35.1 g/dL (ref 30.0–36.0)
MCV: 95 fL (ref 80.0–100.0)
Platelets: 155 10*3/uL (ref 150–400)
RBC: 3.21 MIL/uL — ABNORMAL LOW (ref 3.87–5.11)
RDW: 17.6 % — ABNORMAL HIGH (ref 11.5–15.5)
WBC: 6.6 10*3/uL (ref 4.0–10.5)
nRBC: 0 % (ref 0.0–0.2)

## 2023-01-04 LAB — GLUCOSE, CAPILLARY
Glucose-Capillary: 279 mg/dL — ABNORMAL HIGH (ref 70–99)
Glucose-Capillary: 260 mg/dL — ABNORMAL HIGH (ref 70–99)

## 2023-01-04 NOTE — ED Notes (Signed)
Patient was assisted to the bathroom with a wheelchair.

## 2023-01-04 NOTE — ED Triage Notes (Signed)
Per EMS, pt was walking her dog when she fell down 4 steps. Pt endorses shoulder pain, head pain with laceration to back of head. C-collar in place. Denies any LOC or blood thinners.

## 2023-01-04 NOTE — Progress Notes (Signed)
   01/04/23 1145  TOC Brief Assessment  Insurance and Status Reviewed  Patient has primary care physician Yes  Home environment has been reviewed Home  Prior level of function: Independent  Prior/Current Home Services No current home services  Social Determinants of Health Reivew SDOH reviewed no interventions necessary  Readmission risk has been reviewed Yes  Transition of care needs no transition of care needs at this time

## 2023-01-04 NOTE — ED Notes (Signed)
Pt ambulated in the hallway without assistance or distress.

## 2023-01-04 NOTE — Discharge Summary (Signed)
Physician Discharge Summary   Patient: Jennifer [name] MRN: [id number] DOB: 05/21/1990  Admit date:     01/02/2023  Discharge date: {dischdate:26783}  Discharge Physician: Tyrone Nine   PCP: Nathaneil Canary, PA-C   Recommendations at discharge:  Follow up with PCP in 1-2 weeks with repeat CMP with attention to potassium, creatinine, and LFTs.   Discharge Diagnoses: Principal Problem:   Syncope Active Problems:   Hypertriglyceridemia   Severe hyperglycemia due to diabetes mellitus (HCC)   Hyperkalemia   Enteritis   LFT elevation   Bladder wall thickening  Resolved Problems:   * No resolved hospital problems. [facility] Course: No notes on file  On 10/9, the patient reports tolerating a full diet with resolution of GI symptoms. She requests discharge. Her creatinine has risen, though this may again be due to hyperglycemia which should improve once back on her home U500 insulin. Now that she's tolerating a diet, she has maximized the benefit of inpatient management. She has reliable outpatient follow up with PCP and endocrinology, and is discharged in stable condition with plans for repeat metabolic profile at follow up. She will hold spironolactone until that time***.   Assessment and Plan: * Syncope -Today, precipitated patient's presentation to the ER.  Maintain the patient on telemetry.  Monitor glucose control.  Patient has several risk factors for syncopized and including volume loss from her enteritis as well as severe electrolyte abnormalities including hyperkalemia and acidosis.  Which will be corrected.  Echo may be pursued if persistent concerns remain.  EKG does not demonstrate QRST abnormalities  Hypertriglyceridemia And apparently has had's severe hypertriglyceridemia in the past complicated by pancreatitis.  At this time I will check a lipid panel, I will continue patient's Crestor, her no fibrate as well as Repatha injection which is due next Monday  Bladder wall  thickening Urinary bladder wall thickening is incidentally noted on the CAT scan with adjacent fat stranding.  However patient has no discomfort focal over there and is not really complaining of pain over there.  I will follow-up the urinalysis and proceed any antibiotic treatment accordingly  LFT elevation Time of this is chronic, however there is an acute worsening, with new elevation in bilirubin as well as cholestatic pattern of LFT elevation.  I will check liver ultrasound, and acute hepatitis panel.  Note patient has no white count or fever to suggest cholangitis.  Patient has been complaining of nausea and epigastric discomfort as a marked complaint of hers  Take ulcer diseases in the consideration of patient having epigastric discomfort, therefore I have ordered empiric pantoprazole  Enteritis Reports about 7 days of diarrhea and for 24 hours of vomiting.  I will check C. difficile and stool pathogen panel.  Hold off on antibiotics at this time.  Hyperkalemia Patient seems to have chronic hyperkalemia.  In the setting of CKD, no AKI is currently appreciated.  Patient also has metabolic acidosis with wide elevation in anion gap.  Patient has been treated with insulin in the ER, got sodium bicarbonate IV push.  As well as IV fluids.  I will continue with the same.  I will order 1 dose of Lokelma, trend the potassium level.  And start the patient on oral sodium bicarbonate Hold spironolactone.  Severe hyperglycemia due to diabetes mellitus (HCC) Start insulin sliding scale moderate intensity.  Monitor glycemic control.      {Tip this will not be part of the note when signed Body mass index is 31.39  kg/m. , ,  (Optional):26781}  {(NOTE) Pain control PDMP Statment (Optional):26782} Consultants: *** Procedures performed: ***  Disposition: {Plan; Disposition:26390} Diet recommendation:  {Diet_Plan:26776} DISCHARGE MEDICATION: Allergies as of 01/04/2023       Reactions   Ketamine  Other (See Comments)   In vegetative state for 15 minutes per pt   [place] Itching   Itchy throat Itchy throat Itchy throat Itchy throat Itchy throat    Itchy throat  Itchy throat Itchy throat Itchy throat Itchy throat    Itchy throat   Morphine Itching, Rash, Hives   Sorethroat   Penicillins Hives, Itching, Rash   Has patient had a PCN reaction causing immediate rash, facial/tongue/throat swelling, SOB or lightheadedness with hypotension: Y Has patient had a PCN reaction causing severe rash involving mucus membranes or skin necrosis: Y Has patient had a PCN reaction that required hospitalization: N Has patient had a PCN reaction occurring within the last 10 years: Y   Clarithromycin Other (See Comments)   Stomach pain   Erythromycin    Fentanyl Nausea And Vomiting   Penicillin V Hives, Itching, Other (See Comments)   Gi upset, also   Shellfish Allergy Other (See Comments)   Pt has never had shellfish but tested positive on allergy test. Pt states contrast in CT is okay   Prednisone Rash        Medication List     STOP taking these medications    doxycycline 100 MG capsule Commonly known as: VIBRAMYCIN   spironolactone 25 MG tablet Commonly known as: ALDACTONE       TAKE these medications    albuterol 108 (90 Base) MCG/ACT inhaler Commonly known as: VENTOLIN HFA Inhale 1-2 puffs into the lungs every 6 (six) hours as needed for wheezing or shortness of breath.   amitriptyline 10 MG tablet Commonly known as: ELAVIL Take 10 mg by mouth at bedtime.   aspirin EC 81 MG tablet Take 1 tablet (81 mg total) by mouth daily. Swallow whole.   Azelastine HCl 137 MCG/SPRAY Soln Place 1 spray into the nose daily.   butalbital-acetaminophen-caffeine 50-325-40 MG tablet Commonly known as: FIORICET Take 1 tablet by mouth in the morning and at bedtime.   carvedilol 3.125 MG tablet Commonly known as: COREG Take 1 tablet (3.125 mg total) by mouth 2 (two) times daily.    Creon 36000-114000 units Cpep capsule Generic drug: lipase/protease/amylase Take 36,000 Units by mouth 3 (three) times daily.   dicyclomine 20 MG tablet Commonly known as: BENTYL Take 1 tablet (20 mg total) by mouth 2 (two) times daily. What changed:  when to take this reasons to take this   DULoxetine 60 MG capsule Commonly known as: CYMBALTA Take 60 mg by mouth at bedtime.   ergocalciferol 1.25 MG (50000 UT) capsule Commonly known as: VITAMIN D2 Take 50,000 Units by mouth every 7 (seven) days. Every Sunday   fenofibrate 54 MG tablet Take 1 tablet (54 mg total) by mouth daily.   HumuLIN R U-500 KwikPen 500 UNIT/ML KwikPen Generic drug: insulin regular human CONCENTRATED Inject 185 Units into the skin 3 (three) times daily with meals. What changed: how much to take   hydrALAZINE 50 MG tablet Commonly known as: APRESOLINE Take 25 mg by mouth 3 (three) times daily.   Insulin Pen Needle 32G X 4 MM Misc 1 Device by Does not apply route QID. For use with insulin pens   ivabradine 7.5 MG Tabs tablet Commonly known as: CORLANOR Take 1 tablet (7.5 mg total)  by mouth 2 (two) times daily with a meal.   levocetirizine 5 MG tablet Commonly known as: XYZAL Take 5 mg by mouth daily.   levothyroxine 137 MCG tablet Commonly known as: SYNTHROID Take 137 mcg by mouth daily.   pantoprazole 40 MG tablet Commonly known as: PROTONIX Take 40 mg by mouth daily.   pregabalin 300 MG capsule Commonly known as: LYRICA Take 300 mg by mouth at bedtime.   prochlorperazine 5 MG tablet Commonly known as: COMPAZINE Take 5 mg by mouth at bedtime.   Repatha 140 MG/ML Sosy Generic drug: Evolocumab Inject 140 mg into the skin See admin instructions. Inject 140 mg subcutaneously every other Thursday evening   rosuvastatin 40 MG tablet Commonly known as: CRESTOR Take 40 mg by mouth daily.   sodium bicarbonate 650 MG tablet Take 650 mg by mouth 2 (two) times daily.   topiramate 50 MG  tablet Commonly known as: TOPAMAX Take 50 mg by mouth 2 (two) times daily.   torsemide 20 MG tablet Commonly known as: DEMADEX Take 2 tablets (40 mg total) by mouth daily. May take 1 additional tablet as needed   tretinoin 0.05 % cream Commonly known as: RETIN-A Apply 1 application topically at bedtime.   Vascepa 1 g capsule Generic drug: icosapent Ethyl Take 2 capsules (2 g total) by mouth 2 (two) times daily.        Follow-up Information     Nathaneil Canary, PA-C Follow up.   Specialty: Physician Assistant Contact information: [address] [place] [address] [phone]         Donalda Ewings, MD Follow up.   Specialty: Endocrinology Contact information: [address] [place] [address] [phone]                Discharge Exam: Ceasar Mons Weights   01/04/23 0900  Weight: 65.8 kg   ***  Condition at discharge: {DC Condition:26389}  The results of significant diagnostics from this hospitalization (including imaging, microbiology, ancillary and laboratory) are listed below for reference.   Imaging Studies: US Abdomen Limited RUQ (LIVER/GB)  Result Date: 01/02/2023 CLINICAL DATA:  Elevated liver function tests. EXAM: ULTRASOUND ABDOMEN LIMITED RIGHT UPPER QUADRANT COMPARISON:  February 07, 2022 FINDINGS: Gallbladder: No gallstones or wall thickening visualized (1.6 mm). No sonographic Murphy sign noted by sonographer. Common bile duct: Diameter: 4.2 mm Liver: The liver is enlarged and measures 23.1 cm in length. No focal lesion identified. Diffusely increased echogenicity of the liver parenchyma is noted. Portal vein is patent on color Doppler imaging with normal direction of blood flow towards the liver. Other: None. IMPRESSION: Hepatomegaly and hepatic steatosis, without focal liver lesions. Electronically Signed   By: Aram Candela M.D.   On: 01/02/2023 20:29   CT ABDOMEN PELVIS W CONTRAST  Result Date:  01/02/2023 CLINICAL DATA:  Abdominal pain, diarrhea, nausea and vomiting EXAM: CT ABDOMEN AND PELVIS WITH CONTRAST TECHNIQUE: Multidetector CT imaging of the abdomen and pelvis was performed using the standard protocol following bolus administration of intravenous contrast. RADIATION DOSE REDUCTION: This exam was performed according to the departmental dose-optimization program which includes automated exposure control, adjustment of the mA and/or kV according to patient size and/or use of iterative reconstruction technique. CONTRAST:  80mL OMNIPAQUE IOHEXOL 300 MG/ML  SOLN COMPARISON:  10/18/2022 FINDINGS: Lower chest: No acute abnormality. Linear metallic foreign body which appears to be within a subsegmental artery of the posterior segment of the left lower lobe, as seen  by prior examination (series 2, image 1). Hepatobiliary: No solid liver abnormality is seen. Hepatomegaly, maximum coronal span 21.8 cm. Hepatic steatosis. No gallstones, gallbladder wall thickening, or biliary dilatation. Pancreas: Unremarkable. No pancreatic ductal dilatation or surrounding inflammatory changes. Spleen: Splenomegaly, maximum coronal span 14.1 cm. Adrenals/Urinary Tract: Adrenal glands are unremarkable. Kidneys are normal, without renal calculi, solid lesion, or hydronephrosis. Urinary bladder wall thickening and adjacent fat stranding (series 2, image 70). Stomach/Bowel: Stomach is within normal limits. Appendix appears normal. No evidence of bowel wall thickening, distention, or inflammatory changes. Vascular/Lymphatic: Severe aortic atherosclerosis. No enlarged abdominal or pelvic lymph nodes. Reproductive: No mass or other significant abnormality. Small benign functional ovarian follicles, requiring no further follow-up or characterization. Other: No abdominal wall hernia or abnormality. No ascites. Musculoskeletal: No acute or significant osseous findings. IMPRESSION: 1. Urinary bladder wall thickening and adjacent fat  stranding, suggestive of nonspecific infectious or inflammatory cystitis. Correlate with urinalysis. 2. Hepatomegaly and hepatic steatosis 3. Linear metallic foreign body which appears to be within a subsegmental artery of the posterior segment of the left lower lobe, as seen by multiple prior examinations and of uncertain nature. 4. Severe abdominal aortic atherosclerosis markedly advanced for patient age. Aortic Atherosclerosis (ICD10-I70.0). Electronically Signed   By: Jearld Lesch M.D.   On: 01/02/2023 18:21    Microbiology: Results for orders placed or performed during the hospital encounter of 01/02/23  SARS Coronavirus 2 by RT PCR (hospital order, performed in [facility] hospital lab) *cepheid single result test* Anterior Nasal Swab     Status: None   Collection Time: 01/02/23  7:40 PM   Specimen: Anterior Nasal Swab  Result Value Ref Range Status   SARS Coronavirus 2 by RT PCR NEGATIVE NEGATIVE Final    Comment: (NOTE) SARS-CoV-2 target nucleic acids are NOT DETECTED.  The SARS-CoV-2 RNA is generally detectable in upper and lower respiratory specimens during the acute phase of infection. The lowest concentration of SARS-CoV-2 viral copies this assay can detect is 250 copies / mL. A negative result does not preclude SARS-CoV-2 infection and should not be used as the sole basis for treatment or other patient management decisions.  A negative result may occur with improper specimen collection / handling, submission of specimen other than nasopharyngeal swab, presence of viral mutation(s) within the areas targeted by this assay, and inadequate number of viral copies (<250 copies / mL). A negative result must be combined with clinical observations, patient history, and epidemiological information.  Fact Sheet for Patients:   RoadLapTop.co.za  Fact Sheet for Healthcare Providers: http://kim-miller.com/  This test is not yet approved or   cleared by the [place] FDA and has been authorized for detection and/or diagnosis of SARS-CoV-2 by FDA under an Emergency Use Authorization (EUA).  This EUA will remain in effect (meaning this test can be used) for the duration of the COVID-19 declaration under Section 564(b)(1) of the Act, 21 U.S.C. section 360bbb-3(b)(1), unless the authorization is terminated or revoked sooner.  Performed at [facility], [address] [place], [place] [address]     Labs: CBC: Recent Labs  Lab 01/02/23 1108 01/02/23 1438 01/02/23 1940 01/03/23 0520 01/04/23 0550  WBC 8.1  --  7.8 7.5 6.6  NEUTROABS 4.8  --   --   --   --   HGB 11.8* 11.2* 11.4* 10.4* 10.7*  HCT 35.7* 33.0* 30.9* 28.8* 30.5*  MCV 92.2  --  92.5 92.6 95.0  PLT 139*  --  142* 138*  155   Basic Metabolic Panel: Recent Labs  Lab 01/02/23 1108 01/02/23 1438 01/02/23 1940 01/03/23 0520 01/04/23 0550  NA 135 134* 130* 133* 135  K 6.2* 6.4* 5.8* 5.3* 5.1  CL 103 113* 101 101 102  CO2 15*  --  19* 20* 16*  GLUCOSE 394* 351* 208* 276* 291*  BUN 36* 43* 31* 25* 26*  CREATININE 1.44* 1.50* 1.11* 1.34* 1.75*  CALCIUM 9.7  --  8.0* 8.2* 8.4*  MG  --   --  2.4  --   --    Liver Function Tests: Recent Labs  Lab 01/02/23 1108 01/03/23 0520 01/04/23 0550  AST 101* 97* 77*  ALT 127* 96* 84*  ALKPHOS 182* 133* 117  BILITOT 1.7* 2.8* 1.6*  PROT RESULTS UNAVAILABLE DUE TO INTERFERING SUBSTANCE RESULTS UNAVAILABLE DUE TO INTERFERING SUBSTANCE RESULTS UNAVAILABLE DUE TO INTERFERING SUBSTANCE  ALBUMIN 4.5 3.5 3.4*   CBG: Recent Labs  Lab 01/03/23 1246 01/03/23 1701 01/03/23 2213 01/04/23 0739 01/04/23 1145  GLUCAP 189* 236* 231* 279* 260*    Discharge time spent: {LESS THAN/GREATER THAN:26388} 30 minutes.  Signed: Tyrone Nine, MD [facility] 01/04/2023

## 2023-01-04 NOTE — ED Provider Notes (Signed)
[place] EMERGENCY DEPARTMENT AT [facility] Provider Note   CSN: [id number] Arrival date & time: 01/04/23  1434     History  Chief Complaint  Patient presents with   Jennifer [name] is a 32 y.o. female.   Fall     32 year old female with medical history significant for astrocytoma in 1997, pancreatitis, PCOS, diabetes mellitus, hypertension, CHF, CKD who presents to the emergency department after a mechanical fall.  She states that she was walking her dog when she got tangled up in the leash and fell down 4 steps.  She struck the back of her head, denies any loss of consciousness or use of anticoagulation.  She denies any neck pain.  [name] unsure of her last tetanus status.  On chart review it appears to have been updated and 2020.  He sustained an abrasion or laceration to the posterior occiput, denies any other injuries, did endorse some shoulder pain.  She arrives GCS 15, ABC intact.  Home Medications Prior to Admission medications   Medication Sig Start Date End Date Taking? Authorizing Provider  albuterol (VENTOLIN HFA) 108 (90 Base) MCG/ACT inhaler Inhale 1-2 puffs into the lungs every 6 (six) hours as needed for wheezing or shortness of breath. 12/31/18   [provider]  amitriptyline (ELAVIL) 10 MG tablet Take 10 mg by mouth at bedtime.    [provider]  aspirin EC 81 MG EC tablet Take 1 tablet (81 mg total) by mouth daily. Swallow whole. 12/07/20   [name], DO  Azelastine HCl 137 MCG/SPRAY SOLN Place 1 spray into the nose daily. Patient not taking: Reported on 01/02/2023 11/28/22 05/27/23  [provider]  butalbital-acetaminophen-caffeine (FIORICET) 50-325-40 MG tablet Take 1 tablet by mouth in the morning and at bedtime. 09/26/22   [provider]  carvedilol (COREG) 3.125 MG tablet Take 1 tablet (3.125 mg total) by mouth 2 (two) times daily. 10/04/22   [name], [name], MD  CREON [phone] units CPEP  capsule Take 36,000 Units by mouth 3 (three) times daily.    [provider]  dicyclomine (BENTYL) 20 MG tablet Take 1 tablet (20 mg total) by mouth 2 (two) times daily. Patient taking differently: Take 20 mg by mouth as needed for spasms. 09/15/22   [name], [name], MD  DULoxetine (CYMBALTA) 60 MG capsule Take 60 mg by mouth at bedtime. 09/26/22   [provider]  ergocalciferol (VITAMIN D2) 1.25 MG (50000 UT) capsule Take 50,000 Units by mouth every 7 (seven) days. Every Sunday    [provider]  Evolocumab (REPATHA) 140 MG/ML SOSY Inject 140 mg into the skin See admin instructions. Inject 140 mg subcutaneously every other Thursday evening    [provider]  fenofibrate 54 MG tablet Take 1 tablet (54 mg total) by mouth daily. 12/01/22   [name], [name], MD  hydrALAZINE (APRESOLINE) 50 MG tablet Take 25 mg by mouth 3 (three) times daily. 11/16/22   [provider]  Insulin Pen Needle 32G X 4 MM MISC 1 Device by Does not apply route QID. For use with insulin pens 02/13/22   [name], MD  insulin regular human CONCENTRATED (HUMULIN R U-500 KWIKPEN) 500 UNIT/ML KwikPen Inject 185 Units into the skin 3 (three) times daily with meals. Patient taking differently: Inject 195 Units into the skin 3 (three) times daily with meals. 09/20/22 01/02/23  [name], MD  ivabradine (CORLANOR) 7.5 MG TABS tablet Take 1 tablet (  7.5 mg total) by mouth 2 (two) times daily with a meal. 08/11/22   [name], MD  levocetirizine (XYZAL) 5 MG tablet Take 5 mg by mouth daily. 03/05/22   [provider]  levothyroxine (SYNTHROID) 137 MCG tablet Take 137 mcg by mouth daily. 09/20/22   [provider]  pantoprazole (PROTONIX) 40 MG tablet Take 40 mg by mouth daily. 12/24/19   [provider]  pregabalin (LYRICA) 300 MG capsule Take 300 mg by mouth at bedtime.    [provider]  prochlorperazine (COMPAZINE) 5 MG tablet Take 5 mg by  mouth at bedtime.    [provider]  rosuvastatin (CRESTOR) 40 MG tablet Take 40 mg by mouth daily. 08/09/21   [provider]  sodium bicarbonate 650 MG tablet Take 650 mg by mouth 2 (two) times daily.    [provider]  topiramate (TOPAMAX) 50 MG tablet Take 50 mg by mouth 2 (two) times daily.    [provider]  torsemide (DEMADEX) 20 MG tablet Take 2 tablets (40 mg total) by mouth daily. May take 1 additional tablet as needed 10/05/22   [name], [name], MD  tretinoin (RETIN-A) 0.05 % cream Apply 1 application topically at bedtime. 01/17/21   [provider]  VASCEPA 1 g capsule Take 2 capsules (2 g total) by mouth 2 (two) times daily. Patient not taking: Reported on 01/02/2023 12/01/22   [name], MD      Allergies    Ketamine, [place], Morphine, Penicillins, Clarithromycin, Erythromycin, Fentanyl, Penicillin v, Shellfish allergy, and Prednisone    Review of Systems   Review of Systems  All other systems reviewed and are negative.   Physical Exam Updated Vital Signs BP 133/75   Pulse 93   Temp 98.6 F (37 C)   Resp 16   SpO2 92%  Physical Exam Vitals and nursing note reviewed.  Constitutional:      General: [name] not in acute distress.    Appearance: [name] well-developed.     Comments: GCS 15, ABC intact  HENT:     Head: Normocephalic.     Comments: Abrasion to the posterior occiput, small 0.25cm punctate lac, hemostatic after stapling Eyes:     Extraocular Movements: Extraocular movements intact.     Conjunctiva/sclera: Conjunctivae normal.     Pupils: Pupils are equal, round, and reactive to light.  Neck:     Comments: No midline tenderness to palpation of the cervical spine.  Range of motion intact Cardiovascular:     Rate and Rhythm: Normal rate and regular rhythm.     Heart sounds: No murmur heard. Pulmonary:     Effort: Pulmonary effort is normal. No respiratory distress.     Breath sounds: Normal breath  sounds.  Chest:     Comments: Clavicles stable nontender to AP compression.  Chest wall stable and nontender to AP and lateral compression. Abdominal:     Palpations: Abdomen is soft.     Tenderness: There is no abdominal tenderness.     Comments: Pelvis stable to lateral compression  Musculoskeletal:     Cervical back: Neck supple.     Comments: No midline tenderness to palpation of the thoracic or lumbar spine.  Extremities atraumatic with intact range of motion, mild hematoma to the right hip noted, no tenderness and intact ROM  Skin:    General: Skin is warm and dry.  Neurological:     Mental Status: [name] alert.  Comments: Cranial nerves II through XII grossly intact.  Moving all 4 extremities spontaneously.  Sensation grossly intact all 4 extremities     ED Results / Procedures / Treatments   Labs (all labs ordered are listed, but only abnormal results are displayed) Labs Reviewed - No data to display  EKG None  Radiology US Abdomen Limited RUQ (LIVER/GB)  Result Date: 01/02/2023 CLINICAL DATA:  Elevated liver function tests. EXAM: ULTRASOUND ABDOMEN LIMITED RIGHT UPPER QUADRANT COMPARISON:  February 07, 2022 FINDINGS: Gallbladder: No gallstones or wall thickening visualized (1.6 mm). No sonographic Murphy sign noted by sonographer. Common bile duct: Diameter: 4.2 mm Liver: The liver is enlarged and measures 23.1 cm in length. No focal lesion identified. Diffusely increased echogenicity of the liver parenchyma is noted. Portal vein is patent on color Doppler imaging with normal direction of blood flow towards the liver. Other: None. IMPRESSION: Hepatomegaly and hepatic steatosis, without focal liver lesions. Electronically Signed   By: Aram Candela M.D.   On: 01/02/2023 20:29    Procedures .Marland KitchenLaceration Repair  Date/Time: 01/04/2023 7:20 PM  Performed by: Ernie Avena, MD Authorized by: Ernie Avena, MD   Consent:    Consent obtained:  Verbal   Consent given  by:  Patient   Risks discussed:  Infection and pain Universal protocol:    Patient identity confirmed:  Verbally with patient Laceration details:    Location:  Scalp   Scalp location:  Occipital   Length (cm):  0.3 Treatment:    Area cleansed with:  Saline   Amount of cleaning:  Standard Skin repair:    Repair method:  Staples   Number of staples:  2 Approximation:    Approximation:  Close Repair type:    Repair type:  Simple Post-procedure details:    Dressing:  Open (no dressing)   Procedure completion:  Tolerated     Medications Ordered in ED Medications - No data to display  ED Course/ Medical Decision Making/ A&P                                 Medical Decision Making   32 year old female with medical history significant for astrocytoma in 1997, pancreatitis, PCOS, diabetes mellitus, hypertension, CHF, CKD who presents to the emergency department after a mechanical fall.  She states that she was walking her dog when she got tangled up in the leash and fell down 4 steps.  She struck the back of her head, denies any loss of consciousness or use of anticoagulation.  She denies any neck pain.  [name] unsure of her last tetanus status.  On chart review it appears to have been updated and 2020.  He sustained an abrasion or laceration to the posterior occiput, denies any other injuries, did endorse some shoulder pain.  She arrives GCS 15, ABC intact.  On arrival, the patient was vitally stable, abrasion or laceration noted to the posterior occiput, Tdap is updated.  Patient C-spine cleared by Nexus criteria, head cleared by Canadian head CT criteria.  With a small hematoma to the right hip intact range of motion and no tenderness.  Small abrasion with a small punctate laceration noted to the posterior occiput, by nursing and subsequently hemostatic after 2 staples were placed, patient overall with reassuring trauma evaluation, ambulatory in the emergency department with nursing,  stable for discharge.  Care instructions were provided in addition to plan for staple removal in 7 to  10 days.   Final Clinical Impression(s) / ED Diagnoses Final diagnoses:  Fall, initial encounter  Abrasion of scalp, initial encounter  Laceration of scalp, initial encounter    Rx / DC Orders ED Discharge Orders     None         Ernie Avena, MD 01/04/23 Ernestina [place]

## 2023-01-04 NOTE — Plan of Care (Signed)
  Problem: Education: Goal: Knowledge of risk factors and measures for prevention of condition will improve Outcome: Progressing   Problem: Coping: Goal: Psychosocial and spiritual needs will be supported Outcome: Progressing   Problem: Respiratory: Goal: Will maintain a patent airway Outcome: Progressing Goal: Complications related to the disease process, condition or treatment will be avoided or minimized Outcome: Progressing   Problem: Education: Goal: Ability to describe self-care measures that may prevent or decrease complications (Diabetes Survival Skills Education) will improve Outcome: Progressing Goal: Individualized Educational Video(s) Outcome: Progressing   Problem: Cardiac: Goal: Ability to maintain an adequate cardiac output will improve Outcome: Progressing   Problem: Health Behavior/Discharge Planning: Goal: Ability to identify and utilize available resources and services will improve Outcome: Progressing Goal: Ability to manage health-related needs will improve Outcome: Progressing   Problem: Fluid Volume: Goal: Ability to achieve a balanced intake and output will improve Outcome: Progressing   Problem: Metabolic: Goal: Ability to maintain appropriate glucose levels will improve Outcome: Progressing   Problem: Nutritional: Goal: Maintenance of adequate nutrition will improve Outcome: Progressing Goal: Maintenance of adequate weight for body size and type will improve Outcome: Progressing   Problem: Respiratory: Goal: Will regain and/or maintain adequate ventilation Outcome: Progressing   Problem: Urinary Elimination: Goal: Ability to achieve and maintain adequate renal perfusion and functioning will improve Outcome: Progressing   Problem: Education: Goal: Ability to describe self-care measures that may prevent or decrease complications (Diabetes Survival Skills Education) will improve Outcome: Progressing Goal: Individualized Educational  Video(s) Outcome: Progressing   Problem: Coping: Goal: Ability to adjust to condition or change in health will improve Outcome: Progressing   Problem: Fluid Volume: Goal: Ability to maintain a balanced intake and output will improve Outcome: Progressing   Problem: Health Behavior/Discharge Planning: Goal: Ability to identify and utilize available resources and services will improve Outcome: Progressing Goal: Ability to manage health-related needs will improve Outcome: Progressing   Problem: Metabolic: Goal: Ability to maintain appropriate glucose levels will improve Outcome: Progressing   Problem: Nutritional: Goal: Maintenance of adequate nutrition will improve Outcome: Progressing Goal: Progress toward achieving an optimal weight will improve Outcome: Progressing   Problem: Skin Integrity: Goal: Risk for impaired skin integrity will decrease Outcome: Progressing   Problem: Tissue Perfusion: Goal: Adequacy of tissue perfusion will improve Outcome: Progressing   Problem: Education: Goal: Knowledge of General Education information will improve Description: Including pain rating scale, medication(s)/side effects and non-pharmacologic comfort measures Outcome: Progressing   Problem: Health Behavior/Discharge Planning: Goal: Ability to manage health-related needs will improve Outcome: Progressing   Problem: Clinical Measurements: Goal: Ability to maintain clinical measurements within normal limits will improve Outcome: Progressing Goal: Will remain free from infection Outcome: Progressing Goal: Diagnostic test results will improve Outcome: Progressing Goal: Respiratory complications will improve Outcome: Progressing Goal: Cardiovascular complication will be avoided Outcome: Progressing   Problem: Activity: Goal: Risk for activity intolerance will decrease Outcome: Progressing   Problem: Nutrition: Goal: Adequate nutrition will be maintained Outcome:  Progressing   Problem: Coping: Goal: Level of anxiety will decrease Outcome: Progressing   Problem: Elimination: Goal: Will not experience complications related to bowel motility Outcome: Progressing Goal: Will not experience complications related to urinary retention Outcome: Progressing   Problem: Pain Managment: Goal: General experience of comfort will improve Outcome: Progressing   Problem: Safety: Goal: Ability to remain free from injury will improve Outcome: Progressing   Problem: Skin Integrity: Goal: Risk for impaired skin integrity will decrease Outcome: Progressing

## 2023-01-05 ENCOUNTER — Other Ambulatory Visit: Payer: Self-pay

## 2023-01-08 ENCOUNTER — Emergency Department (HOSPITAL_COMMUNITY)
Admission: EM | Admit: 2023-01-08 | Discharge: 2023-01-09 | Disposition: A | Discharge status: Home / Self Care | Payer: Medicaid Other | Attending: Emergency Medicine | Admitting: Emergency Medicine

## 2023-01-08 ENCOUNTER — Encounter (HOSPITAL_COMMUNITY): Payer: Self-pay

## 2023-01-08 ENCOUNTER — Other Ambulatory Visit: Payer: Self-pay

## 2023-01-08 DIAGNOSIS — E871 Hypo-osmolality and hyponatremia: Secondary | ICD-10-CM | POA: Diagnosis not present

## 2023-01-08 DIAGNOSIS — R55 Syncope and collapse: Secondary | ICD-10-CM | POA: Diagnosis not present

## 2023-01-08 DIAGNOSIS — E1165 Type 2 diabetes mellitus with hyperglycemia: Secondary | ICD-10-CM | POA: Insufficient documentation

## 2023-01-08 DIAGNOSIS — R739 Hyperglycemia, unspecified: Secondary | ICD-10-CM

## 2023-01-08 DIAGNOSIS — N189 Chronic kidney disease, unspecified: Secondary | ICD-10-CM | POA: Diagnosis not present

## 2023-01-08 DIAGNOSIS — R0989 Other specified symptoms and signs involving the circulatory and respiratory systems: Secondary | ICD-10-CM | POA: Diagnosis not present

## 2023-01-08 DIAGNOSIS — Z743 Need for continuous supervision: Secondary | ICD-10-CM | POA: Diagnosis not present

## 2023-01-08 DIAGNOSIS — I509 Heart failure, unspecified: Secondary | ICD-10-CM | POA: Insufficient documentation

## 2023-01-08 DIAGNOSIS — H538 Other visual disturbances: Secondary | ICD-10-CM | POA: Diagnosis not present

## 2023-01-08 DIAGNOSIS — Z789 Other specified health status: Secondary | ICD-10-CM | POA: Diagnosis not present

## 2023-01-08 DIAGNOSIS — R42 Dizziness and giddiness: Secondary | ICD-10-CM | POA: Insufficient documentation

## 2023-01-08 DIAGNOSIS — E1122 Type 2 diabetes mellitus with diabetic chronic kidney disease: Secondary | ICD-10-CM | POA: Diagnosis not present

## 2023-01-08 DIAGNOSIS — H547 Unspecified visual loss: Secondary | ICD-10-CM | POA: Diagnosis not present

## 2023-01-08 NOTE — ED Triage Notes (Signed)
Pt BIBEMS w/ c/o dizziness, blurred vision. Pt DM II, as per pt's Dex Com cbg >300. Took Humalog 25 units & RU 195. C/o abd distention for the last few days.

## 2023-01-09 ENCOUNTER — Emergency Department (HOSPITAL_COMMUNITY): Payer: Medicaid Other

## 2023-01-09 DIAGNOSIS — H31093 Other chorioretinal scars, bilateral: Secondary | ICD-10-CM | POA: Diagnosis not present

## 2023-01-09 DIAGNOSIS — H538 Other visual disturbances: Secondary | ICD-10-CM | POA: Diagnosis not present

## 2023-01-09 DIAGNOSIS — H34832 Tributary (branch) retinal vein occlusion, left eye, with macular edema: Secondary | ICD-10-CM | POA: Diagnosis not present

## 2023-01-09 DIAGNOSIS — H34811 Central retinal vein occlusion, right eye, with macular edema: Secondary | ICD-10-CM | POA: Diagnosis not present

## 2023-01-09 DIAGNOSIS — R0989 Other specified symptoms and signs involving the circulatory and respiratory systems: Secondary | ICD-10-CM | POA: Diagnosis not present

## 2023-01-09 DIAGNOSIS — H3582 Retinal ischemia: Secondary | ICD-10-CM | POA: Diagnosis not present

## 2023-01-09 DIAGNOSIS — Z7689 Persons encountering health services in other specified circumstances: Secondary | ICD-10-CM | POA: Diagnosis not present

## 2023-01-09 DIAGNOSIS — R42 Dizziness and giddiness: Secondary | ICD-10-CM | POA: Diagnosis not present

## 2023-01-09 DIAGNOSIS — H35033 Hypertensive retinopathy, bilateral: Secondary | ICD-10-CM | POA: Diagnosis not present

## 2023-01-09 LAB — I-STAT VENOUS BLOOD GAS, ED
Acid-base deficit: 11 mmol/L — ABNORMAL HIGH (ref 0.0–2.0)
Bicarbonate: 13.7 mmol/L — ABNORMAL LOW (ref 20.0–28.0)
Calcium, Ion: 0.89 mmol/L — CL (ref 1.15–1.40)
HCT: 21 % — ABNORMAL LOW (ref 36.0–46.0)
Hemoglobin: 7.1 g/dL — ABNORMAL LOW (ref 12.0–15.0)
O2 Saturation: 99 %
Potassium: 4.3 mmol/L (ref 3.5–5.1)
Sodium: 138 mmol/L (ref 135–145)
TCO2: 14 mmol/L — ABNORMAL LOW (ref 22–32)
pCO2, Ven: 23.9 mm[Hg] — ABNORMAL LOW (ref 44–60)
pH, Ven: 7.365 (ref 7.25–7.43)
pO2, Ven: 133 mm[Hg] — ABNORMAL HIGH (ref 32–45)

## 2023-01-09 LAB — CBG MONITORING, ED
Glucose-Capillary: 70 mg/dL (ref 70–99)
Glucose-Capillary: 82 mg/dL (ref 70–99)
Glucose-Capillary: 85 mg/dL (ref 70–99)
Glucose-Capillary: 94 mg/dL (ref 70–99)

## 2023-01-09 LAB — BASIC METABOLIC PANEL
Anion gap: 18 — ABNORMAL HIGH (ref 5–15)
Anion gap: 11 (ref 5–15)
BUN: 42 mg/dL — ABNORMAL HIGH (ref 6–20)
BUN: 41 mg/dL — ABNORMAL HIGH (ref 6–20)
CO2: 17 mmol/L — ABNORMAL LOW (ref 22–32)
CO2: 19 mmol/L — ABNORMAL LOW (ref 22–32)
Calcium: 9.3 mg/dL (ref 8.9–10.3)
Calcium: 8.5 mg/dL — ABNORMAL LOW (ref 8.9–10.3)
Chloride: 90 mmol/L — ABNORMAL LOW (ref 98–111)
Chloride: 97 mmol/L — ABNORMAL LOW (ref 98–111)
Creatinine, Ser: 1.49 mg/dL — ABNORMAL HIGH (ref 0.44–1.00)
Creatinine, Ser: 1.52 mg/dL — ABNORMAL HIGH (ref 0.44–1.00)
GFR, Estimated: 48 mL/min — ABNORMAL LOW (ref >60)
GFR, Estimated: 47 mL/min — ABNORMAL LOW (ref >60)
Glucose, Bld: 61 mg/dL — ABNORMAL LOW (ref 70–99)
Glucose, Bld: 60 mg/dL — ABNORMAL LOW (ref 70–99)
Potassium: 6.5 mmol/L (ref 3.5–5.1)
Sodium: 125 mmol/L — ABNORMAL LOW (ref 135–145)
Sodium: 127 mmol/L — ABNORMAL LOW (ref 135–145)

## 2023-01-09 LAB — CBC
HCT: 30.3 % — ABNORMAL LOW (ref 36.0–46.0)
Hemoglobin: 10.5 g/dL — ABNORMAL LOW (ref 12.0–15.0)
MCH: 30.4 pg (ref 26.0–34.0)
MCHC: 34.7 g/dL (ref 30.0–36.0)
MCV: 87.8 fL (ref 80.0–100.0)
Platelets: 268 10*3/uL (ref 150–400)
RBC: 3.45 MIL/uL — ABNORMAL LOW (ref 3.87–5.11)
RDW: 17 % — ABNORMAL HIGH (ref 11.5–15.5)
WBC: 8.7 10*3/uL (ref 4.0–10.5)
nRBC: 0.2 % (ref 0.0–0.2)

## 2023-01-09 LAB — BRAIN NATRIURETIC PEPTIDE: B Natriuretic Peptide: 23.4 pg/mL (ref 0.0–100.0)

## 2023-01-09 LAB — HCG, SERUM, QUALITATIVE: Preg, Serum: NEGATIVE

## 2023-01-09 LAB — I-STAT CG4 LACTIC ACID, ED: Lactic Acid, Venous: 2.5 mmol/L (ref 0.5–1.9)

## 2023-01-09 MED ORDER — SODIUM CHLORIDE 0.9 % IV BOLUS
1000.0000 mL | Freq: Once | INTRAVENOUS | Status: AC
  Administered 2023-01-09: 1000 mL via INTRAVENOUS

## 2023-01-09 NOTE — ED Notes (Signed)
PT ambulated to the bathroom with little assistance

## 2023-01-09 NOTE — ED Notes (Signed)
Patient transported to CT 

## 2023-01-09 NOTE — ED Provider Notes (Signed)
[facility]ERGENCY DEPT [facility] Emergency Department Provider Note MRN:  [id number]  Arrival date & time: 01/09/23     Chief Complaint   Dizziness   History of Present Illness   Jennifer [name] is a 32 y.o. year-old female with hx of DM, CHF, CKD, who presents to the ED with chief complaint of dizziness and blurry vision.  She states that it felt like her blood sugar was high.  She put her dexcom on and it read >300.  She took insulin with improvement.  She reports some abdominal distention, but denies pain.  She denies fever or chills.  Denies any vomiting, but has had nausea.    History provided by patient.   Review of Systems  Pertinent positive and negative review of systems noted in HPI.    Physical Exam   Vitals:   01/09/23 0430 01/09/23 0445  BP: (!) 142/70 121/65  Pulse: 99 (!) 102  Resp: 16 (!) 21  Temp:    SpO2: 100% 99%    CONSTITUTIONAL:  non toxic-appearing, NAD NEURO:  Alert and oriented x 3, CN 3-12 grossly intact EYES:  eyes equal and reactive ENT/NECK:  Supple, no stridor  CARDIO:  tachycardic, regular rhythm, appears well-perfused  PULM:  No respiratory distress, CTAB GI/GU:  mildly distended, but no focal tenderness MSK/SPINE:  No gross deformities, no edema, moves all extremities  SKIN:  no rash, atraumatic   *Additional and/or pertinent findings included in MDM below  Diagnostic and Interventional Summary    EKG Interpretation Date/Time:  Monday January 09 2023 00:17:40 EDT Ventricular Rate:  115 PR Interval:  121 QRS Duration:  90 QT Interval:  350 QTC Calculation: 485 R Axis:   3  Text Interpretation: Sinus tachycardia LVH with secondary repolarization abnormality Borderline prolonged QT interval Rate faster Confirmed by Glynn Octave [phone]) on 01/09/2023 1:19:45 AM       Labs Reviewed  BASIC METABOLIC PANEL - Abnormal; Notable for the following components:      Result Value   Sodium 125 (*)    Potassium 6.5 (*)     Chloride 90 (*)    CO2 17 (*)    Glucose, Bld 61 (*)    BUN 42 (*)    Creatinine, Ser 1.49 (*)    GFR, Estimated 48 (*)    Anion gap 18 (*)    All other components within normal limits  CBC - Abnormal; Notable for the following components:   RBC 3.45 (*)    Hemoglobin 10.5 (*)    HCT 30.3 (*)    RDW 17.0 (*)    All other components within normal limits  BASIC METABOLIC PANEL - Abnormal; Notable for the following components:   Sodium 127 (*)    Chloride 97 (*)    CO2 19 (*)    Glucose, Bld 60 (*)    BUN 41 (*)    Creatinine, Ser 1.52 (*)    Calcium 8.5 (*)    GFR, Estimated 47 (*)    All other components within normal limits  I-STAT VENOUS BLOOD GAS, ED - Abnormal; Notable for the following components:   pCO2, Ven 23.9 (*)    pO2, Ven 133 (*)    Bicarbonate 13.7 (*)    TCO2 14 (*)    Acid-base deficit 11.0 (*)    Calcium, Ion 0.89 (*)    HCT 21.0 (*)    Hemoglobin 7.1 (*)    All other components within normal limits  I-STAT CG4 LACTIC ACID,  ED - Abnormal; Notable for the following components:   Lactic Acid, Venous 2.5 (*)    All other components within normal limits  HCG, SERUM, QUALITATIVE  BRAIN NATRIURETIC PEPTIDE  URINALYSIS, ROUTINE W REFLEX MICROSCOPIC  CBG MONITORING, ED  CBG MONITORING, ED  CBG MONITORING, ED  I-STAT CG4 LACTIC ACID, ED    DG Chest 2 View  Final Result      Medications  sodium chloride 0.9 % bolus 1,000 mL (1,000 mLs Intravenous New Bag/Given 01/09/23 0248)     Procedures  /  Critical Care Procedures  ED Course and Medical Decision Making  I have reviewed the triage vital signs, the nursing notes, and pertinent available records from the EMR.  Social Determinants Affecting Complexity of Care: Patient has no clinically significant social determinants affecting this chief complaint..   ED Course: Clinical Course as of 01/09/23 0628  Mon Jan 09, 2023  0614 I-Stat venous blood gas, [facility] ED, [facility], [facility])(!!) K 4.3 [RB]  0614 Basic  metabolic panel(!) Normal anion gap after fluids, still has some hyponatremia, but improved.   [RB]  0615 Brain natriuretic peptide Not suggestive of volume overload [RB]  0615 Patient reassessed.  She states that she is feeling much better.  [name] sitting on the edge of the bed and says she is ready to go.  Will PO challenge.  Most recent cbg is 38, will make sure glucose uptrends after eating. [RB]  0627 CBG monitoring, ED Improved.  Tolerating PO.  States she feels ready to go. [RB]    Clinical Course User Index [RB] Roxy Horseman, PA-C    Medical Decision Making Patient here with some blurred vision and dizziness.  She states that she thinks it is due to hyperglycemia and states that her dexcom hasn't been working.  She finally got her dexcom working and it read >300.  This prompted her to come to the ED.  [name] not vomiting.  She does report some nausea.    Amount and/or Complexity of Data Reviewed Labs: ordered. Decision-making details documented in ED Course. Radiology: ordered.         Consultants: No consultations were needed in caring for this patient.   Treatment and Plan: I considered admission due to patient's initial presentation, but after considering the examination and diagnostic results, patient will not require admission and can be discharged with outpatient follow-up.  Patient seen by and discussed with attending physician, Dr. Manus Gunning, who recommends fluids and recheck of labs.  Final Clinical Impressions(s) / ED Diagnoses     ICD-10-CM   1. Dizziness  R42     2. Hyperglycemia  R73.9       ED Discharge Orders     None         Discharge Instructions Discussed with and Provided to Patient:   Discharge Instructions   None      Roxy Horseman, PA-C 01/09/23 8295    Glynn Octave, MD 01/11/23 [phone]

## 2023-01-12 ENCOUNTER — Encounter (HOSPITAL_COMMUNITY): Payer: Self-pay

## 2023-01-12 ENCOUNTER — Emergency Department (HOSPITAL_COMMUNITY): Payer: Medicaid Other

## 2023-01-12 ENCOUNTER — Other Ambulatory Visit: Payer: Self-pay

## 2023-01-12 ENCOUNTER — Emergency Department (HOSPITAL_COMMUNITY)
Admission: EM | Admit: 2023-01-12 | Discharge: 2023-01-13 | Disposition: A | Discharge status: Home / Self Care | Payer: Medicaid Other | Attending: Emergency Medicine | Admitting: Emergency Medicine

## 2023-01-12 DIAGNOSIS — R000 Tachycardia, unspecified: Secondary | ICD-10-CM | POA: Diagnosis not present

## 2023-01-12 DIAGNOSIS — I10 Essential (primary) hypertension: Secondary | ICD-10-CM | POA: Diagnosis not present

## 2023-01-12 DIAGNOSIS — R0602 Shortness of breath: Secondary | ICD-10-CM | POA: Diagnosis not present

## 2023-01-12 DIAGNOSIS — R609 Edema, unspecified: Secondary | ICD-10-CM | POA: Diagnosis not present

## 2023-01-12 DIAGNOSIS — Z794 Long term (current) use of insulin: Secondary | ICD-10-CM | POA: Diagnosis not present

## 2023-01-12 DIAGNOSIS — E119 Type 2 diabetes mellitus without complications: Secondary | ICD-10-CM | POA: Insufficient documentation

## 2023-01-12 DIAGNOSIS — I130 Hypertensive heart and chronic kidney disease with heart failure and stage 1 through stage 4 chronic kidney disease, or unspecified chronic kidney disease: Secondary | ICD-10-CM | POA: Insufficient documentation

## 2023-01-12 DIAGNOSIS — S0101XA Laceration without foreign body of scalp, initial encounter: Secondary | ICD-10-CM | POA: Diagnosis not present

## 2023-01-12 DIAGNOSIS — I509 Heart failure, unspecified: Secondary | ICD-10-CM | POA: Insufficient documentation

## 2023-01-12 DIAGNOSIS — R110 Nausea: Secondary | ICD-10-CM | POA: Diagnosis not present

## 2023-01-12 DIAGNOSIS — R5383 Other fatigue: Secondary | ICD-10-CM | POA: Diagnosis not present

## 2023-01-12 DIAGNOSIS — N1832 Chronic kidney disease, stage 3b: Secondary | ICD-10-CM | POA: Diagnosis not present

## 2023-01-12 DIAGNOSIS — R1013 Epigastric pain: Secondary | ICD-10-CM | POA: Insufficient documentation

## 2023-01-12 DIAGNOSIS — R300 Dysuria: Secondary | ICD-10-CM | POA: Diagnosis not present

## 2023-01-12 DIAGNOSIS — E538 Deficiency of other specified B group vitamins: Secondary | ICD-10-CM | POA: Diagnosis not present

## 2023-01-12 DIAGNOSIS — R197 Diarrhea, unspecified: Secondary | ICD-10-CM | POA: Diagnosis not present

## 2023-01-12 DIAGNOSIS — D649 Anemia, unspecified: Secondary | ICD-10-CM | POA: Diagnosis not present

## 2023-01-12 DIAGNOSIS — Z79899 Other long term (current) drug therapy: Secondary | ICD-10-CM | POA: Insufficient documentation

## 2023-01-12 DIAGNOSIS — R079 Chest pain, unspecified: Secondary | ICD-10-CM | POA: Insufficient documentation

## 2023-01-12 DIAGNOSIS — D500 Iron deficiency anemia secondary to blood loss (chronic): Secondary | ICD-10-CM | POA: Diagnosis not present

## 2023-01-12 DIAGNOSIS — N189 Chronic kidney disease, unspecified: Secondary | ICD-10-CM | POA: Diagnosis not present

## 2023-01-12 DIAGNOSIS — R1084 Generalized abdominal pain: Secondary | ICD-10-CM | POA: Diagnosis not present

## 2023-01-12 DIAGNOSIS — Z7982 Long term (current) use of aspirin: Secondary | ICD-10-CM | POA: Insufficient documentation

## 2023-01-12 DIAGNOSIS — E039 Hypothyroidism, unspecified: Secondary | ICD-10-CM | POA: Insufficient documentation

## 2023-01-12 DIAGNOSIS — I3139 Other pericardial effusion (noninflammatory): Secondary | ICD-10-CM | POA: Diagnosis not present

## 2023-01-12 DIAGNOSIS — Z23 Encounter for immunization: Secondary | ICD-10-CM | POA: Diagnosis not present

## 2023-01-12 DIAGNOSIS — E1159 Type 2 diabetes mellitus with other circulatory complications: Secondary | ICD-10-CM | POA: Diagnosis not present

## 2023-01-12 DIAGNOSIS — N390 Urinary tract infection, site not specified: Secondary | ICD-10-CM | POA: Diagnosis not present

## 2023-01-12 DIAGNOSIS — Z1624 Resistance to multiple antibiotics: Secondary | ICD-10-CM | POA: Diagnosis not present

## 2023-01-12 DIAGNOSIS — R918 Other nonspecific abnormal finding of lung field: Secondary | ICD-10-CM | POA: Diagnosis not present

## 2023-01-12 DIAGNOSIS — Z743 Need for continuous supervision: Secondary | ICD-10-CM | POA: Diagnosis not present

## 2023-01-12 DIAGNOSIS — I152 Hypertension secondary to endocrine disorders: Secondary | ICD-10-CM | POA: Diagnosis not present

## 2023-01-12 DIAGNOSIS — R162 Hepatomegaly with splenomegaly, not elsewhere classified: Secondary | ICD-10-CM | POA: Diagnosis not present

## 2023-01-12 DIAGNOSIS — R739 Hyperglycemia, unspecified: Secondary | ICD-10-CM | POA: Diagnosis not present

## 2023-01-12 LAB — LIPASE, BLOOD: Lipase: 32 U/L (ref 11–51)

## 2023-01-12 LAB — CBC
HCT: 31.7 % — ABNORMAL LOW (ref 36.0–46.0)
Hemoglobin: 10.2 g/dL — ABNORMAL LOW (ref 12.0–15.0)
MCH: 30.3 pg (ref 26.0–34.0)
MCHC: 32.2 g/dL (ref 30.0–36.0)
MCV: 94.1 fL (ref 80.0–100.0)
Platelets: 211 10*3/uL (ref 150–400)
RBC: 3.37 MIL/uL — ABNORMAL LOW (ref 3.87–5.11)
RDW: 17.1 % — ABNORMAL HIGH (ref 11.5–15.5)
WBC: 7.1 10*3/uL (ref 4.0–10.5)
nRBC: 0.6 % — ABNORMAL HIGH (ref 0.0–0.2)

## 2023-01-12 LAB — BASIC METABOLIC PANEL
Anion gap: 10 (ref 5–15)
BUN: 36 mg/dL — ABNORMAL HIGH (ref 6–20)
CO2: 18 mmol/L — ABNORMAL LOW (ref 22–32)
Calcium: 8.8 mg/dL — ABNORMAL LOW (ref 8.9–10.3)
Chloride: 99 mmol/L (ref 98–111)
Creatinine, Ser: 1.53 mg/dL — ABNORMAL HIGH (ref 0.44–1.00)
GFR, Estimated: 46 mL/min — ABNORMAL LOW (ref >60)
Glucose, Bld: 243 mg/dL — ABNORMAL HIGH (ref 70–99)
Potassium: 6 mmol/L — ABNORMAL HIGH (ref 3.5–5.1)
Sodium: 127 mmol/L — ABNORMAL LOW (ref 135–145)

## 2023-01-12 LAB — HCG, SERUM, QUALITATIVE: Preg, Serum: NEGATIVE

## 2023-01-12 LAB — CBG MONITORING, ED: Glucose-Capillary: 291 mg/dL — ABNORMAL HIGH (ref 70–99)

## 2023-01-12 LAB — TROPONIN I (HIGH SENSITIVITY): Troponin I (High Sensitivity): 15 ng/L (ref <18)

## 2023-01-12 MED ORDER — METOCLOPRAMIDE HCL 5 MG/ML IJ SOLN
10.0000 mg | Freq: Once | INTRAMUSCULAR | Status: AC
  Administered 2023-01-12: 10 mg via INTRAVENOUS
  Filled 2023-01-12: qty 2

## 2023-01-12 MED ORDER — LACTATED RINGERS IV BOLUS
1000.0000 mL | Freq: Once | INTRAVENOUS | Status: AC
  Administered 2023-01-12: 1000 mL via INTRAVENOUS

## 2023-01-12 MED ORDER — HYDROMORPHONE HCL 1 MG/ML IJ SOLN
0.5000 mg | Freq: Once | INTRAMUSCULAR | Status: AC
  Administered 2023-01-12: 0.5 mg via INTRAVENOUS
  Filled 2023-01-12: qty 1

## 2023-01-12 NOTE — ED Notes (Signed)
Pt says about 10 minutes ago while waiting in the lobby her chest started to hurt. (Left chest). Labs/EKG ordered

## 2023-01-12 NOTE — ED Provider Notes (Signed)
[place] EMERGENCY DEPARTMENT AT [facility] Provider Note   CSN: [id number] Arrival date & time: 01/12/23  1756     History  Chief Complaint  Patient presents with   Abdominal Pain    Jennifer [name] is a 32 y.o. female.  Patient with a history of diabetes, hypertension, CHF, previous brain tumor, CKD, hypothyroidism presenting with epigastric pain with concern for pancreatitis.  States she woke up this morning with epigastric pain has been constant all day worse with eating.  She has had nausea but no vomiting.  Does have chronic diarrhea at baseline which is unchanged.  No fever.  Feels like this is pancreatitis again.  She still has a gallbladder.  Recently evaluated for hyperglycemia as well as dizziness which she states are improving.  Was recently treated for UTI which she states was not getting better despite treatment.  While she was waiting in the waiting room she developed left-sided chest pain has been ongoing for the past 30 minutes or so.  She has had an MI in the past. Having some shortness of breath to worsen with deep breathing.  Abdominal pain is constant and worse with eating.  She has not vomited.  States her sugars are still high.  The history is provided by the patient.  Abdominal Pain Associated symptoms: diarrhea, fatigue and nausea   Associated symptoms: no dysuria, no fever, no hematuria, no shortness of breath and no vomiting        Home Medications Prior to Admission medications   Medication Sig Start Date End Date Taking? Authorizing Provider  amoxicillin-clavulanate (AUGMENTIN) 875-125 MG tablet Take 1 tablet by mouth 2 (two) times daily. 01/12/23 01/22/23 Yes [provider]  amitriptyline (ELAVIL) 10 MG tablet Take 10 mg by mouth at bedtime.    [provider]  aspirin EC 81 MG EC tablet Take 1 tablet (81 mg total) by mouth daily. Swallow whole. 12/07/20   [name], DO  Azelastine HCl 137 MCG/SPRAY SOLN Place  1 spray into the nose daily. Patient not taking: Reported on 01/02/2023 11/28/22 05/27/23  [provider]  butalbital-acetaminophen-caffeine (FIORICET) 50-325-40 MG tablet Take 1 tablet by mouth daily as needed for headache. 09/26/22   [provider]  carvedilol (COREG) 3.125 MG tablet Take 1 tablet (3.125 mg total) by mouth 2 (two) times daily. 10/04/22   [name], [name], MD  CREON [phone] units CPEP capsule Take 36,000 Units by mouth 3 (three) times daily.    [provider]  dicyclomine (BENTYL) 20 MG tablet Take 1 tablet (20 mg total) by mouth 2 (two) times daily. Patient taking differently: Take 20 mg by mouth as needed for spasms. 09/15/22   [name], [name], MD  DULoxetine (CYMBALTA) 60 MG capsule Take 60 mg by mouth at bedtime. 09/26/22   [provider]  ergocalciferol (VITAMIN D2) 1.25 MG (50000 UT) capsule Take 50,000 Units by mouth every 7 (seven) days. Every Sunday    [provider]  Evolocumab (REPATHA) 140 MG/ML SOSY Inject 140 mg into the skin See admin instructions. Inject 140 mg subcutaneously every other Thursday evening    [provider]  fenofibrate 54 MG tablet Take 1 tablet (54 mg total) by mouth daily. 12/01/22   [name], [name], MD  hydrALAZINE (APRESOLINE) 50 MG tablet Take 50 mg by mouth 3 (three) times daily. 11/16/22   [provider]  insulin lispro (HUMALOG) 100 UNIT/ML injection Inject 20 Units into the skin 3 (three) times daily  before meals.    [provider]  Insulin Pen Needle 32G X 4 MM MISC 1 Device by Does not apply route QID. For use with insulin pens 02/13/22   [name], MD  insulin regular human CONCENTRATED (HUMULIN R U-500 KWIKPEN) 500 UNIT/ML KwikPen Inject 185 Units into the skin 3 (three) times daily with meals. Patient taking differently: Inject 195 Units into the skin 3 (three) times daily with meals. 09/20/22 01/09/24  [name], MD  ivabradine (CORLANOR) 7.5 MG  TABS tablet Take 1 tablet (7.5 mg total) by mouth 2 (two) times daily with a meal. Patient not taking: Reported on 01/09/2023 08/11/22   [name], MD  levocetirizine (XYZAL) 5 MG tablet Take 5 mg by mouth daily. 03/05/22   [provider]  levothyroxine (SYNTHROID) 137 MCG tablet Take 137 mcg by mouth daily. 09/20/22   [provider]  pantoprazole (PROTONIX) 40 MG tablet Take 40 mg by mouth daily. 12/24/19   [provider]  pregabalin (LYRICA) 300 MG capsule Take 300 mg by mouth at bedtime.    [provider]  prochlorperazine (COMPAZINE) 5 MG tablet Take 5 mg by mouth at bedtime.    [provider]  rosuvastatin (CRESTOR) 40 MG tablet Take 40 mg by mouth daily. 08/09/21   [provider]  sodium bicarbonate 650 MG tablet Take 650 mg by mouth 2 (two) times daily.    [provider]  topiramate (TOPAMAX) 50 MG tablet Take 50 mg by mouth 2 (two) times daily.    [provider]  torsemide (DEMADEX) 20 MG tablet Take 2 tablets (40 mg total) by mouth daily. May take 1 additional tablet as needed 10/05/22   [name], [name], MD  tretinoin (RETIN-A) 0.05 % cream Apply 1 application topically at bedtime. 01/17/21   [provider]  VASCEPA 1 g capsule Take 2 capsules (2 g total) by mouth 2 (two) times daily. Patient not taking: Reported on 01/02/2023 12/01/22   [name], MD      Allergies    Ketamine, [place], Morphine, Penicillins, Clarithromycin, Erythromycin, Fentanyl, Penicillin v, Shellfish allergy, and Prednisone    Review of Systems   Review of Systems  Constitutional:  Positive for activity change, appetite change and fatigue. Negative for fever.  HENT:  Negative for congestion.   Respiratory:  Positive for chest tightness. Negative for shortness of breath.   Gastrointestinal:  Positive for abdominal pain, diarrhea and nausea. Negative for vomiting.  Genitourinary:  Negative for dysuria and  hematuria.  Musculoskeletal:  Negative for arthralgias and myalgias.  Skin:  Negative for rash.  Neurological:  Positive for weakness. Negative for dizziness and headaches.   all other systems are negative except as noted in the HPI and PMH.    Physical Exam Updated Vital Signs BP (!) 151/89 (BP Location: Right Arm)   Pulse (!) 122   Temp 98.5 F (36.9 C) (Oral)   Resp 16   Ht 4\' 9"  (1.448 m)   Wt 59 kg   SpO2 98%   BMI 28.13 kg/m  Physical Exam Vitals and nursing note reviewed.  Constitutional:      General: [name] not in acute distress.    Appearance: [name] well-developed.  HENT:     Head: Normocephalic and atraumatic.     Mouth/Throat:     Pharynx: No oropharyngeal exudate.  Eyes:     Conjunctiva/sclera: Conjunctivae normal.     Pupils: Pupils are equal, round,  and reactive to light.  Neck:     Comments: No meningismus. Cardiovascular:     Rate and Rhythm: Normal rate and regular rhythm.     Heart sounds: Normal heart sounds. No murmur heard. Pulmonary:     Effort: Pulmonary effort is normal. No respiratory distress.     Breath sounds: Normal breath sounds.  Abdominal:     Palpations: Abdomen is soft.     Tenderness: There is abdominal tenderness. There is no guarding or rebound.     Comments: Epigastric tenderness, no guarding or rebound  Musculoskeletal:        General: No tenderness. Normal range of motion.     Cervical back: Normal range of motion and neck supple.  Skin:    General: Skin is warm.  Neurological:     Mental Status: [name] alert and oriented to person, place, and time.     Cranial Nerves: No cranial nerve deficit.     Motor: No abnormal muscle tone.     Coordination: Coordination normal.     Comments: 5/5 strength throughout except L arm weakness which is at baseline. CN 2-12 intact.Equal grip strength.    Psychiatric:        Behavior: Behavior normal.     ED Results / Procedures / Treatments   Labs (all labs ordered are listed, but  only abnormal results are displayed) Labs Reviewed  BASIC METABOLIC PANEL - Abnormal; Notable for the following components:      Result Value   Sodium 127 (*)    Potassium 6.0 (*)    CO2 18 (*)    Glucose, Bld 243 (*)    BUN 36 (*)    Creatinine, Ser 1.53 (*)    Calcium 8.8 (*)    GFR, Estimated 46 (*)    All other components within normal limits  CBC - Abnormal; Notable for the following components:   RBC 3.37 (*)    Hemoglobin 10.2 (*)    HCT 31.7 (*)    RDW 17.1 (*)    nRBC 0.6 (*)    All other components within normal limits  HEPATIC FUNCTION PANEL - Abnormal; Notable for the following components:   Total Protein 5.8 (*)    Total Bilirubin 1.5 (*)    Bilirubin, Direct 0.6 (*)    All other components within normal limits  URINALYSIS, ROUTINE W REFLEX MICROSCOPIC - Abnormal; Notable for the following components:   APPearance HAZY (*)    Glucose, UA 150 (*)    Hgb urine dipstick MODERATE (*)    Protein, ur 100 (*)    Leukocytes,Ua LARGE (*)    Bacteria, UA RARE (*)    All other components within normal limits  CBG MONITORING, ED - Abnormal; Notable for the following components:   Glucose-Capillary 291 (*)    All other components within normal limits  TROPONIN I (HIGH SENSITIVITY) - Abnormal; Notable for the following components:   Troponin I (High Sensitivity) 18 (*)    All other components within normal limits  TROPONIN I (HIGH SENSITIVITY) - Abnormal; Notable for the following components:   Troponin I (High Sensitivity) 22 (*)    All other components within normal limits  TROPONIN I (HIGH SENSITIVITY) - Abnormal; Notable for the following components:   Troponin I (High Sensitivity) 19 (*)    All other components within normal limits  URINE CULTURE  HCG, SERUM, QUALITATIVE  LIPASE, BLOOD  POTASSIUM  TROPONIN I (HIGH SENSITIVITY)    EKG EKG  Interpretation Date/Time:  Thursday January 12 2023 21:37:15 EDT Ventricular Rate:  124 PR Interval:  121 QRS  Duration:  86 QT Interval:  325 QTC Calculation: 467 R Axis:   84  Text Interpretation: Sinus tachycardia Borderline T abnormalities, inferior leads Nonspecific T wave abnormality Confirmed by Glynn Octave [phone]) on 01/12/2023 11:29:01 PM  Radiology CT Angio Chest PE W and/or Wo Contrast  Result Date: 01/13/2023 CLINICAL DATA:  Abdominal pain. High probability for PE EXAM: CT ANGIOGRAPHY CHEST CT ABDOMEN AND PELVIS WITH CONTRAST TECHNIQUE: Multidetector CT imaging of the chest was performed using the standard protocol during bolus administration of intravenous contrast. Multiplanar CT image reconstructions and MIPs were obtained to evaluate the vascular anatomy. Multidetector CT imaging of the abdomen and pelvis was performed using the standard protocol during bolus administration of intravenous contrast. RADIATION DOSE REDUCTION: This exam was performed according to the departmental dose-optimization program which includes automated exposure control, adjustment of the mA and/or kV according to patient size and/or use of iterative reconstruction technique. CONTRAST:  OMNIPAQUE IOHEXOL 350 MG/ML SOLN COMPARISON:  CT abdomen and pelvis 01/02/2023. CT angiogram chest 10/18/2022. FINDINGS: CTA CHEST FINDINGS Cardiovascular: Satisfactory opacification of the pulmonary arteries to the segmental level. No evidence of pulmonary embolism. Normal heart size. There is a small pericardial effusion which is new from prior. Thin linear radiopaque surgical clip or coil seen within a left lower lobe pulmonary artery branch, unchanged from prior. Mediastinum/Nodes: No enlarged mediastinal, hilar, or axillary lymph nodes. Thyroid gland, trachea, and esophagus demonstrate no significant findings. Lungs/Pleura: There some minimal mild ground-glass opacities in the bilateral lower lobes. The lungs are otherwise clear. No pleural effusion or pneumothorax. Musculoskeletal: No chest wall abnormality. No acute or  significant osseous findings. Review of the MIP images confirms the above findings. CT ABDOMEN and PELVIS FINDINGS Hepatobiliary: The liver is enlarged, unchanged. No focal liver lesions are seen. The gallbladder and bile ducts are within normal limits. Pancreas: Unremarkable. No pancreatic ductal dilatation or surrounding inflammatory changes. Spleen: Mildly enlarged, unchanged. Adrenals/Urinary Tract: There is mild asymmetric wall thickening of the left-sided bladder as noted on prior. The kidneys and adrenal glands are within normal limits. Stomach/Bowel: Stomach is within normal limits. Appendix appears normal. No evidence of bowel wall thickening, distention, or inflammatory changes. Vascular/Lymphatic: There are atherosclerotic calcifications of the aorta. Aortic caliber is small measuring 1 cm in the infrarenal portion, unchanged. IVC within normal limits. No enlarged lymph nodes are identified. Reproductive: Uterus and bilateral adnexa are unremarkable. Other: There is no ascites or focal abdominal wall hernia. There is focal subcutaneous density lateral to the right buttock measuring 1.6 x 3.0 by 4.1 cm, possibly hematoma in the setting trauma. Other etiologies are not excluded. There is mild diffuse subcutaneous edema of the buttocks. There is also an 8 mm rounded density in the subcutaneous tissues of the anterior lower left abdominal wall. Musculoskeletal: No acute or significant osseous findings. Review of the MIP images confirms the above findings. IMPRESSION: 1. No evidence for pulmonary embolism. 2. New small pericardial effusion. 3. Minimal ground-glass opacities in the lower lobes may represent mild edema or infection. 4. Subcutaneous density lateral to the right buttock may represent hematoma in the setting of trauma. Other etiologies are not excluded. There is also an 8 mm rounded density in the subcutaneous tissues of the anterior lower left abdominal wall which may represent a small hematoma.  5. Stable hepatosplenomegaly. 6. Stable asymmetric left-sided bladder wall thickening. Correlate clinically. Aortic Atherosclerosis (ICD10-I70.0).  Electronically Signed   By: Darliss Cheney M.D.   On: 01/13/2023 00:38   CT ABDOMEN PELVIS W CONTRAST  Result Date: 01/13/2023 CLINICAL DATA:  Abdominal pain. High probability for PE EXAM: CT ANGIOGRAPHY CHEST CT ABDOMEN AND PELVIS WITH CONTRAST TECHNIQUE: Multidetector CT imaging of the chest was performed using the standard protocol during bolus administration of intravenous contrast. Multiplanar CT image reconstructions and MIPs were obtained to evaluate the vascular anatomy. Multidetector CT imaging of the abdomen and pelvis was performed using the standard protocol during bolus administration of intravenous contrast. RADIATION DOSE REDUCTION: This exam was performed according to the departmental dose-optimization program which includes automated exposure control, adjustment of the mA and/or kV according to patient size and/or use of iterative reconstruction technique. CONTRAST:  OMNIPAQUE IOHEXOL 350 MG/ML SOLN COMPARISON:  CT abdomen and pelvis 01/02/2023. CT angiogram chest 10/18/2022. FINDINGS: CTA CHEST FINDINGS Cardiovascular: Satisfactory opacification of the pulmonary arteries to the segmental level. No evidence of pulmonary embolism. Normal heart size. There is a small pericardial effusion which is new from prior. Thin linear radiopaque surgical clip or coil seen within a left lower lobe pulmonary artery branch, unchanged from prior. Mediastinum/Nodes: No enlarged mediastinal, hilar, or axillary lymph nodes. Thyroid gland, trachea, and esophagus demonstrate no significant findings. Lungs/Pleura: There some minimal mild ground-glass opacities in the bilateral lower lobes. The lungs are otherwise clear. No pleural effusion or pneumothorax. Musculoskeletal: No chest wall abnormality. No acute or significant osseous findings. Review of the MIP images  confirms the above findings. CT ABDOMEN and PELVIS FINDINGS Hepatobiliary: The liver is enlarged, unchanged. No focal liver lesions are seen. The gallbladder and bile ducts are within normal limits. Pancreas: Unremarkable. No pancreatic ductal dilatation or surrounding inflammatory changes. Spleen: Mildly enlarged, unchanged. Adrenals/Urinary Tract: There is mild asymmetric wall thickening of the left-sided bladder as noted on prior. The kidneys and adrenal glands are within normal limits. Stomach/Bowel: Stomach is within normal limits. Appendix appears normal. No evidence of bowel wall thickening, distention, or inflammatory changes. Vascular/Lymphatic: There are atherosclerotic calcifications of the aorta. Aortic caliber is small measuring 1 cm in the infrarenal portion, unchanged. IVC within normal limits. No enlarged lymph nodes are identified. Reproductive: Uterus and bilateral adnexa are unremarkable. Other: There is no ascites or focal abdominal wall hernia. There is focal subcutaneous density lateral to the right buttock measuring 1.6 x 3.0 by 4.1 cm, possibly hematoma in the setting trauma. Other etiologies are not excluded. There is mild diffuse subcutaneous edema of the buttocks. There is also an 8 mm rounded density in the subcutaneous tissues of the anterior lower left abdominal wall. Musculoskeletal: No acute or significant osseous findings. Review of the MIP images confirms the above findings. IMPRESSION: 1. No evidence for pulmonary embolism. 2. New small pericardial effusion. 3. Minimal ground-glass opacities in the lower lobes may represent mild edema or infection. 4. Subcutaneous density lateral to the right buttock may represent hematoma in the setting of trauma. Other etiologies are not excluded. There is also an 8 mm rounded density in the subcutaneous tissues of the anterior lower left abdominal wall which may represent a small hematoma. 5. Stable hepatosplenomegaly. 6. Stable asymmetric  left-sided bladder wall thickening. Correlate clinically. Aortic Atherosclerosis (ICD10-I70.0). Electronically Signed   By: Darliss Cheney M.D.   On: 01/13/2023 00:38   DG Chest 2 View  Result Date: 01/12/2023 CLINICAL DATA:  Sudden onset mid chest pain EXAM: CHEST - 2 VIEW COMPARISON:  Radiograph 01/09/2023 FINDINGS: Stable cardiomediastinal silhouette. CardioMEMS  device. No focal consolidation, pleural effusion, or pneumothorax. No displaced rib fractures. IMPRESSION: No active cardiopulmonary disease. Electronically Signed   By: Minerva Fester M.D.   On: 01/12/2023 22:09    Procedures Procedures    Medications Ordered in ED Medications  lactated ringers bolus 1,000 mL (has no administration in time range)  metoCLOPramide (REGLAN) injection 10 mg (has no administration in time range)  HYDROmorphone (DILAUDID) injection 0.5 mg (has no administration in time range)    ED Course/ Medical Decision Making/ A&P                                 Medical Decision Making Amount and/or Complexity of Data Reviewed Labs: ordered. Decision-making details documented in ED Course. Radiology: ordered and independent interpretation performed. Decision-making details documented in ED Course. ECG/medicine tests: ordered and independent interpretation performed. Decision-making details documented in ED Course.  Risk Prescription drug management.   Abdominal pain with nausea and diarrhea.  Concern for pancreatitis.  Stable vital signs.  [name] tachycardic.  EKG shows sinus rhythm with nonspecific ST changes.  Abdomen soft without peritoneal signs.  Labs obtained in triage show hyperkalemia of 6.0, lipase is normal.  Will add on LFTs.  Repeat potassium to assess for hemolysis.  Potassium is 5.0.  Suspect first 1 was hemolyzed.  UA concerning for infection again.  She does have a history of frequent UTIs with multidrug resistance.  Urine culture on October 1 at her PCPs office had  Culture confirmed  E. coli resistant to Bactrim, Cipro, levofloxacin, tetracyclines.   Recently placed a course of Augmentin. CT imaging is reassuring.  No evidence of pancreatitis or pulmonary embolism.  Troponin minimally elevated but flat and downtrending.  Low suspicion for ACS.  CT negative for PE.  Patient states she did receive a new course of Augmentin by her PCP today for urinary tract infection which she has not yet started.  Will send culture.  Does not appear to be toxic or septic.  Heart rate is improved to 100.  Patient feels well.  [name] tolerating p.o.  Abdomen pain has improved.  Labs reassuring as above.  She does have triglyceridemia and labs are significantly lipemic.  LFTs are still in process but her lipase is normal.  Recently had a right upper quadrant ultrasound that was negative except for hepatic steatosis.  Discussed p.o. hydration at home, fill her Augmentin prescription that was sent by her PCP today.  Urine culture is sent. Workup not consistent with ACS.  Negative for PE. No acute intra-abdominal pathology seen on imaging.  Heart rate has improved to 100.  Start clear liquid diet and advance slowly as tolerated.  Return to the ED with new or worsening symptoms.        Final Clinical Impression(s) / ED Diagnoses Final diagnoses:  Epigastric pain    Rx / DC Orders ED Discharge Orders     None         Arcadia Gorgas, Jeannett Senior, MD 01/13/23 [phone]

## 2023-01-12 NOTE — ED Triage Notes (Signed)
Patient reports abdominal pain x 1 day. Has hx of pancreatitis. States this feels the same. BGL 291 on arrival.

## 2023-01-13 ENCOUNTER — Emergency Department (HOSPITAL_COMMUNITY): Payer: Medicaid Other

## 2023-01-13 ENCOUNTER — Other Ambulatory Visit (HOSPITAL_COMMUNITY): Payer: Self-pay | Admitting: Radiology

## 2023-01-13 DIAGNOSIS — R609 Edema, unspecified: Secondary | ICD-10-CM | POA: Diagnosis not present

## 2023-01-13 DIAGNOSIS — I3139 Other pericardial effusion (noninflammatory): Secondary | ICD-10-CM | POA: Diagnosis not present

## 2023-01-13 DIAGNOSIS — R918 Other nonspecific abnormal finding of lung field: Secondary | ICD-10-CM | POA: Diagnosis not present

## 2023-01-13 DIAGNOSIS — R162 Hepatomegaly with splenomegaly, not elsewhere classified: Secondary | ICD-10-CM | POA: Diagnosis not present

## 2023-01-13 LAB — URINALYSIS, ROUTINE W REFLEX MICROSCOPIC
Bilirubin Urine: NEGATIVE
Glucose, UA: 150 mg/dL — AB
Ketones, ur: NEGATIVE mg/dL
Nitrite: NEGATIVE
Protein, ur: 100 mg/dL — AB
Specific Gravity, Urine: 1.019 (ref 1.005–1.030)
pH: 5 (ref 5.0–8.0)

## 2023-01-13 LAB — TROPONIN I (HIGH SENSITIVITY)
Troponin I (High Sensitivity): 18 ng/L — ABNORMAL HIGH (ref <18)
Troponin I (High Sensitivity): 22 ng/L — ABNORMAL HIGH (ref <18)
Troponin I (High Sensitivity): 19 ng/L — ABNORMAL HIGH (ref <18)

## 2023-01-13 LAB — HEPATIC FUNCTION PANEL
ALT: 114 U/L — ABNORMAL HIGH (ref 0–44)
AST: 157 U/L — ABNORMAL HIGH (ref 15–41)
Albumin: 4.1 g/dL (ref 3.5–5.0)
Alkaline Phosphatase: 161 U/L — ABNORMAL HIGH (ref 38–126)
Bilirubin, Direct: 0.6 mg/dL — ABNORMAL HIGH (ref 0.0–0.2)
Indirect Bilirubin: 0.9 mg/dL (ref 0.3–0.9)
Total Bilirubin: 1.5 mg/dL — ABNORMAL HIGH (ref 0.3–1.2)
Total Protein: 5.8 g/dL — ABNORMAL LOW (ref 6.5–8.1)

## 2023-01-13 LAB — POTASSIUM: Potassium: 5 mmol/L (ref 3.5–5.1)

## 2023-01-13 MED ORDER — SODIUM CHLORIDE 0.9 % IV SOLN
12.5000 mg | Freq: Once | INTRAVENOUS | Status: AC
  Administered 2023-01-13: 12.5 mg via INTRAVENOUS
  Filled 2023-01-13: qty 12.5

## 2023-01-13 MED ORDER — IOHEXOL 350 MG/ML SOLN
100.0000 mL | Freq: Once | INTRAVENOUS | Status: AC | PRN
  Administered 2023-01-13: 100 mL via INTRAVENOUS

## 2023-01-13 NOTE — ED Notes (Signed)
Lab called and advised nurse that D-Dimer cannot be ran due to pt labs coming back lipemic. Order has been cancelled.

## 2023-01-13 NOTE — Discharge Instructions (Signed)
Your testing is reassuring.  No evidence of pancreatitis.  Continue medications as prescribed including the Augmentin that was sent by your primary doctor today for urinary tract infection. Return to the ED with exertional chest pain, pain associate with shortness of breath, nausea, vomiting, unable to eat or drink or any concerns.

## 2023-01-13 NOTE — ED Notes (Signed)
AVS summary provided by edp was discussed with pt. Pt verbalized understanding with no additional questions at this time.

## 2023-01-14 LAB — URINE CULTURE: Culture: 80000 — AB

## 2023-01-16 NOTE — Plan of Care (Signed)
[facility] Tonsillectomy/Adenoidectomy, Postoperative PEDS care plan entered in error.

## 2023-01-17 ENCOUNTER — Emergency Department (HOSPITAL_BASED_OUTPATIENT_CLINIC_OR_DEPARTMENT_OTHER): Payer: Medicaid Other | Admitting: Radiology

## 2023-01-17 ENCOUNTER — Encounter (HOSPITAL_BASED_OUTPATIENT_CLINIC_OR_DEPARTMENT_OTHER): Payer: Self-pay | Admitting: Emergency Medicine

## 2023-01-17 ENCOUNTER — Other Ambulatory Visit: Payer: Self-pay

## 2023-01-17 ENCOUNTER — Inpatient Hospital Stay (HOSPITAL_BASED_OUTPATIENT_CLINIC_OR_DEPARTMENT_OTHER)
Admission: EM | Admit: 2023-01-17 | Discharge: 2023-01-20 | DRG: 642 | Disposition: A | Discharge status: Home / Self Care | Payer: Medicaid Other | Attending: Family Medicine | Admitting: Family Medicine

## 2023-01-17 DIAGNOSIS — Z885 Allergy status to narcotic agent status: Secondary | ICD-10-CM

## 2023-01-17 DIAGNOSIS — I272 Pulmonary hypertension, unspecified: Secondary | ICD-10-CM | POA: Diagnosis present

## 2023-01-17 DIAGNOSIS — R0602 Shortness of breath: Secondary | ICD-10-CM | POA: Diagnosis not present

## 2023-01-17 DIAGNOSIS — E11649 Type 2 diabetes mellitus with hypoglycemia without coma: Secondary | ICD-10-CM | POA: Diagnosis not present

## 2023-01-17 DIAGNOSIS — I5022 Chronic systolic (congestive) heart failure: Secondary | ICD-10-CM | POA: Diagnosis present

## 2023-01-17 DIAGNOSIS — R0789 Other chest pain: Secondary | ICD-10-CM | POA: Diagnosis not present

## 2023-01-17 DIAGNOSIS — Z91013 Allergy to seafood: Secondary | ICD-10-CM

## 2023-01-17 DIAGNOSIS — I428 Other cardiomyopathies: Secondary | ICD-10-CM | POA: Diagnosis present

## 2023-01-17 DIAGNOSIS — Z833 Family history of diabetes mellitus: Secondary | ICD-10-CM

## 2023-01-17 DIAGNOSIS — R509 Fever, unspecified: Secondary | ICD-10-CM | POA: Diagnosis not present

## 2023-01-17 DIAGNOSIS — Z8349 Family history of other endocrine, nutritional and metabolic diseases: Secondary | ICD-10-CM

## 2023-01-17 DIAGNOSIS — I081 Rheumatic disorders of both mitral and tricuspid valves: Secondary | ICD-10-CM | POA: Diagnosis present

## 2023-01-17 DIAGNOSIS — I951 Orthostatic hypotension: Secondary | ICD-10-CM | POA: Diagnosis present

## 2023-01-17 DIAGNOSIS — Z794 Long term (current) use of insulin: Secondary | ICD-10-CM

## 2023-01-17 DIAGNOSIS — I517 Cardiomegaly: Secondary | ICD-10-CM | POA: Diagnosis not present

## 2023-01-17 DIAGNOSIS — I21A1 Myocardial infarction type 2: Secondary | ICD-10-CM

## 2023-01-17 DIAGNOSIS — E871 Hypo-osmolality and hyponatremia: Secondary | ICD-10-CM | POA: Diagnosis present

## 2023-01-17 DIAGNOSIS — R778 Other specified abnormalities of plasma proteins: Secondary | ICD-10-CM | POA: Diagnosis not present

## 2023-01-17 DIAGNOSIS — E039 Hypothyroidism, unspecified: Secondary | ICD-10-CM | POA: Diagnosis present

## 2023-01-17 DIAGNOSIS — G8929 Other chronic pain: Secondary | ICD-10-CM | POA: Diagnosis present

## 2023-01-17 DIAGNOSIS — R531 Weakness: Secondary | ICD-10-CM | POA: Diagnosis present

## 2023-01-17 DIAGNOSIS — Z888 Allergy status to other drugs, medicaments and biological substances status: Secondary | ICD-10-CM

## 2023-01-17 DIAGNOSIS — E781 Pure hyperglyceridemia: Principal | ICD-10-CM | POA: Diagnosis present

## 2023-01-17 DIAGNOSIS — Z85841 Personal history of malignant neoplasm of brain: Secondary | ICD-10-CM

## 2023-01-17 DIAGNOSIS — Z7982 Long term (current) use of aspirin: Secondary | ICD-10-CM

## 2023-01-17 DIAGNOSIS — R079 Chest pain, unspecified: Secondary | ICD-10-CM | POA: Diagnosis not present

## 2023-01-17 DIAGNOSIS — K760 Fatty (change of) liver, not elsewhere classified: Secondary | ICD-10-CM | POA: Diagnosis present

## 2023-01-17 DIAGNOSIS — R61 Generalized hyperhidrosis: Secondary | ICD-10-CM | POA: Diagnosis not present

## 2023-01-17 DIAGNOSIS — N179 Acute kidney failure, unspecified: Secondary | ICD-10-CM | POA: Diagnosis present

## 2023-01-17 DIAGNOSIS — R7989 Other specified abnormal findings of blood chemistry: Secondary | ICD-10-CM

## 2023-01-17 DIAGNOSIS — E872 Acidosis, unspecified: Secondary | ICD-10-CM | POA: Diagnosis present

## 2023-01-17 DIAGNOSIS — I2510 Atherosclerotic heart disease of native coronary artery without angina pectoris: Secondary | ICD-10-CM

## 2023-01-17 DIAGNOSIS — I10 Essential (primary) hypertension: Secondary | ICD-10-CM | POA: Diagnosis present

## 2023-01-17 DIAGNOSIS — N184 Chronic kidney disease, stage 4 (severe): Secondary | ICD-10-CM | POA: Diagnosis present

## 2023-01-17 DIAGNOSIS — K219 Gastro-esophageal reflux disease without esophagitis: Secondary | ICD-10-CM

## 2023-01-17 DIAGNOSIS — Z83438 Family history of other disorder of lipoprotein metabolism and other lipidemia: Secondary | ICD-10-CM

## 2023-01-17 DIAGNOSIS — E875 Hyperkalemia: Secondary | ICD-10-CM | POA: Diagnosis not present

## 2023-01-17 DIAGNOSIS — I4891 Unspecified atrial fibrillation: Secondary | ICD-10-CM | POA: Diagnosis present

## 2023-01-17 DIAGNOSIS — K861 Other chronic pancreatitis: Secondary | ICD-10-CM | POA: Diagnosis present

## 2023-01-17 DIAGNOSIS — Z881 Allergy status to other antibiotic agents status: Secondary | ICD-10-CM

## 2023-01-17 DIAGNOSIS — E782 Mixed hyperlipidemia: Secondary | ICD-10-CM | POA: Diagnosis present

## 2023-01-17 DIAGNOSIS — E1165 Type 2 diabetes mellitus with hyperglycemia: Secondary | ICD-10-CM | POA: Diagnosis present

## 2023-01-17 DIAGNOSIS — I3139 Other pericardial effusion (noninflammatory): Secondary | ICD-10-CM | POA: Diagnosis present

## 2023-01-17 DIAGNOSIS — I130 Hypertensive heart and chronic kidney disease with heart failure and stage 1 through stage 4 chronic kidney disease, or unspecified chronic kidney disease: Secondary | ICD-10-CM | POA: Diagnosis present

## 2023-01-17 DIAGNOSIS — Z79890 Hormone replacement therapy (postmenopausal): Secondary | ICD-10-CM

## 2023-01-17 DIAGNOSIS — Z880 Allergy status to penicillin: Secondary | ICD-10-CM

## 2023-01-17 DIAGNOSIS — E860 Dehydration: Secondary | ICD-10-CM | POA: Diagnosis present

## 2023-01-17 DIAGNOSIS — Z800 Family history of malignant neoplasm of digestive organs: Secondary | ICD-10-CM

## 2023-01-17 DIAGNOSIS — I160 Hypertensive urgency: Secondary | ICD-10-CM | POA: Diagnosis present

## 2023-01-17 DIAGNOSIS — E1122 Type 2 diabetes mellitus with diabetic chronic kidney disease: Secondary | ICD-10-CM | POA: Diagnosis present

## 2023-01-17 DIAGNOSIS — Z79899 Other long term (current) drug therapy: Secondary | ICD-10-CM

## 2023-01-17 DIAGNOSIS — Z8249 Family history of ischemic heart disease and other diseases of the circulatory system: Secondary | ICD-10-CM

## 2023-01-17 DIAGNOSIS — R000 Tachycardia, unspecified: Secondary | ICD-10-CM | POA: Diagnosis present

## 2023-01-17 LAB — CBC
HCT: 33.4 % — ABNORMAL LOW (ref 36.0–46.0)
Hemoglobin: 12.1 g/dL (ref 12.0–15.0)
MCH: 32.4 pg (ref 26.0–34.0)
MCHC: 36.2 g/dL — ABNORMAL HIGH (ref 30.0–36.0)
MCV: 89.3 fL (ref 80.0–100.0)
Platelets: 220 10*3/uL (ref 150–400)
RBC: 3.74 MIL/uL — ABNORMAL LOW (ref 3.87–5.11)
RDW: 16.7 % — ABNORMAL HIGH (ref 11.5–15.5)
WBC: 7.7 10*3/uL (ref 4.0–10.5)
nRBC: 0 % (ref 0.0–0.2)

## 2023-01-17 LAB — TROPONIN I (HIGH SENSITIVITY)
Troponin I (High Sensitivity): 76 ng/L — ABNORMAL HIGH (ref <18)
Troponin I (High Sensitivity): 81 ng/L — ABNORMAL HIGH (ref <18)

## 2023-01-17 MED ORDER — HYDROMORPHONE HCL 1 MG/ML IJ SOLN
0.5000 mg | Freq: Once | INTRAMUSCULAR | Status: AC
  Administered 2023-01-17: 0.5 mg via INTRAVENOUS
  Filled 2023-01-17: qty 1

## 2023-01-17 MED ORDER — ONDANSETRON HCL 4 MG/2ML IJ SOLN
4.0000 mg | Freq: Once | INTRAMUSCULAR | Status: AC
  Administered 2023-01-17: 4 mg via INTRAVENOUS
  Filled 2023-01-17: qty 2

## 2023-01-17 NOTE — Progress Notes (Signed)
Plan of Care Note for accepted transfer   Patient: Jennifer [name] MRN: [id number]   DOA: 01/17/2023  Facility requesting transfer: [facility]   Requesting Provider: Dr. 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   HR is 120s and SBP as high as 200 in ED. Troponin was 76 then 81. No acute findings noted on CXR.   Cardiology fellow recommended medical admission, trend troponins, check echocardiogram, and check CRP but do not treat as ACS for now.   She was given Dilaudid and Zofran in the ED.    BMP is currently in-process.   Plan of care: The patient is accepted for admission to Telemetry unit, at [facility].   Author: Briscoe Deutscher, MD 01/17/2023  Check [email or web address] for on-call coverage.  Nursing staff, Please call [facility] Admits & Consults System-Wide number on Amion as soon as patient's arrival, so appropriate admitting provider can evaluate the pt.

## 2023-01-17 NOTE — ED Provider Notes (Signed)
Jennifer [name] Provider Note   CSN: [id number] Arrival date & time: 01/17/23  1951     History  Chief Complaint  Patient presents with   Chest Pain    Jennifer [name] is a 32 y.o. female.  The history is provided by the patient and medical records. No language interpreter was used.  Chest Pain Pain location:  Substernal area and L chest Pain quality: aching and pressure   Pain radiates to:  Does not radiate Pain severity:  Severe Onset quality:  Gradual Duration:  1 day Timing:  Constant Progression:  Waxing and waning Chronicity:  New Relieved by:  Nothing Worsened by:  Deep breathing and exertion Ineffective treatments:  None tried Associated symptoms: diaphoresis, nausea, shortness of breath and vomiting   Associated symptoms: no abdominal pain, no back pain, no cough, no fatigue, no headache, no numbness, no palpitations and no syncope        Home Medications Prior to Admission medications   Medication Sig Start Date End Date Taking? Authorizing Provider  amitriptyline (ELAVIL) 10 MG tablet Take 10 mg by mouth at bedtime.    Provider, Historical, [name]  amoxicillin-clavulanate (AUGMENTIN) 875-125 MG tablet Take 1 tablet by mouth 2 (two) times daily. Patient not taking: Reported on 01/12/2023 01/12/23 01/22/23  Provider, Historical, [name]  aspirin EC 81 MG EC tablet Take 1 tablet (81 mg total) by mouth daily. Swallow whole. 12/07/20   Jennifer [name]  Azelastine HCl 137 MCG/SPRAY SOLN Place 1 spray into the nose daily. Patient not taking: Reported on 01/02/2023 11/28/22 05/27/23  Provider, Historical, [name]  butalbital-acetaminophen-caffeine (FIORICET) 50-325-40 MG tablet Take 1 tablet by mouth daily as needed for headache. 09/26/22   Provider, Historical, [name]  carvedilol (COREG) 3.125 MG tablet Take 1 tablet (3.125 mg total) by mouth 2 (two) times daily. Patient not taking: Reported on 01/12/2023 10/04/22   [name], [name], [name]   CREON [id number] units CPEP capsule Take 36,000 Units by mouth 3 (three) times daily.    Provider, Historical, [name]  dicyclomine (BENTYL) 20 MG tablet Take 1 tablet (20 mg total) by mouth 2 (two) times daily. Patient taking differently: Take 20 mg by mouth as needed for spasms. 09/15/22   Jennifer [name]  DULoxetine (CYMBALTA) 60 MG capsule Take 60 mg by mouth at bedtime. 09/26/22   Provider, Historical, [name]  ergocalciferol (VITAMIN D2) 1.25 MG (50000 UT) capsule Take 50,000 Units by mouth every 7 (seven) days. Every Sunday    Provider, Historical, [name]  Evolocumab (REPATHA) 140 MG/ML SOSY Inject 140 mg into the skin See admin instructions. Inject 140 mg subcutaneously every other Thursday evening    Provider, Historical, [name]  fenofibrate 54 MG tablet Take 1 tablet (54 mg total) by mouth daily. 12/01/22   [name], [name], [name]  hydrALAZINE (APRESOLINE) 50 MG tablet Take 50 mg by mouth 3 (three) times daily. 11/16/22   Provider, Historical, [name]  insulin lispro (HUMALOG) 100 UNIT/ML injection Inject 20 Units into the skin 3 (three) times daily before meals.    Provider, Historical, [name]  Insulin Pen Needle 32G X 4 MM MISC 1 Device by Does not apply route QID. For use with insulin pens 02/13/22   Jennifer [name]  insulin regular human CONCENTRATED (HUMULIN R U-500 KWIKPEN) 500 UNIT/ML KwikPen Inject 185 Units into the skin 3 (three) times daily with meals. Patient taking differently: Inject 195 Units into the skin 3 (three) times daily with meals. 09/20/22 01/09/24  Jennifer [name]  ivabradine (CORLANOR) 7.5 MG TABS tablet Take 1 tablet (7.5 mg total) by mouth 2 (two) times daily with a meal. Patient not taking: Reported on 01/09/2023 08/11/22   Jennifer [name]  levocetirizine (XYZAL) 5 MG tablet Take 5 mg by mouth daily. 03/05/22   Provider, Historical, [name]  levothyroxine (SYNTHROID) 137 MCG tablet Take 137 mcg by mouth daily. 09/20/22   Provider, Historical, [name]  pantoprazole (PROTONIX) 40 MG  tablet Take 40 mg by mouth daily. 12/24/19   Provider, Historical, [name]  pregabalin (LYRICA) 300 MG capsule Take 300 mg by mouth at bedtime.    Provider, Historical, [name]  prochlorperazine (COMPAZINE) 5 MG tablet Take 5 mg by mouth at bedtime.    Provider, Historical, [name]  rosuvastatin (CRESTOR) 40 MG tablet Take 40 mg by mouth daily. 08/09/21   Provider, Historical, [name]  sodium bicarbonate 650 MG tablet Take 650 mg by mouth 2 (two) times daily.    Provider, Historical, [name]  topiramate (TOPAMAX) 50 MG tablet Take 50 mg by mouth 2 (two) times daily.    Provider, Historical, [name]  torsemide (DEMADEX) 20 MG tablet Take 2 tablets (40 mg total) by mouth daily. May take 1 additional tablet as needed 10/05/22   [name], [name], [name]  tretinoin (RETIN-A) 0.05 % cream Apply 1 application topically at bedtime. 01/17/21   Provider, Historical, [name]  VASCEPA 1 g capsule Take 2 capsules (2 g total) by mouth 2 (two) times daily. Patient not taking: Reported on 01/02/2023 12/01/22   Jennifer [name]      Allergies    Ketamine, [place], Morphine, Penicillins, Clarithromycin, Erythromycin, Fentanyl, Penicillin v, Shellfish allergy, and Prednisone    Review of Systems   Review of Systems  Constitutional:  Positive for diaphoresis. Negative for chills and fatigue.  HENT:  Negative for congestion.   Respiratory:  Positive for chest tightness and shortness of breath. Negative for cough and choking.   Cardiovascular:  Positive for chest pain. Negative for palpitations and syncope.  Gastrointestinal:  Positive for nausea and vomiting. Negative for abdominal pain, constipation and diarrhea.  Genitourinary:  Negative for dysuria and flank pain.  Musculoskeletal:  Negative for back pain.  Neurological:  Negative for numbness and headaches.  Psychiatric/Behavioral:  Negative for agitation.   All other systems reviewed and are negative.   Physical Exam Updated Vital Signs BP (!) 201/127 (BP Location: Right Arm)   Pulse (!)  131   Temp 98.1 F (36.7 C) (Oral)   Resp 17   SpO2 96%  Physical Exam Vitals and nursing note reviewed.  Constitutional:      General: [name] not in acute distress.    Appearance: [name] well-developed. [name] not ill-appearing, toxic-appearing or diaphoretic.  HENT:     Head: Normocephalic and atraumatic.     Right Ear: External ear normal.     Left Ear: External ear normal.     Nose: Nose normal.     Mouth/Throat:     Pharynx: No oropharyngeal exudate.  Eyes:     Conjunctiva/sclera: Conjunctivae normal.     Pupils: Pupils are equal, round, and reactive to light.  Cardiovascular:     Rate and Rhythm: Regular rhythm. Tachycardia present.  Pulmonary:     Effort: No respiratory distress.     Breath sounds: No stridor. No decreased breath sounds, wheezing, rhonchi or rales.  Chest:     Chest wall: No tenderness.  Abdominal:  General: There is no distension.     Palpations: Abdomen is soft.     Tenderness: There is no abdominal tenderness. There is no rebound.  Musculoskeletal:     Cervical back: Normal range of motion and neck supple.  Skin:    General: Skin is warm.     Findings: No erythema or rash.  Neurological:     General: No focal deficit present.     Mental Status: [name] alert and oriented to person, place, and time.     Motor: No abnormal muscle tone.     Coordination: Coordination normal.     Deep Tendon Reflexes: Reflexes are normal and symmetric.     ED Results / Procedures / Treatments   Labs (all labs ordered are listed, but only abnormal results are displayed) Labs Reviewed  CBC - Abnormal; Notable for the following components:      Result Value   RBC 3.74 (*)    HCT 33.4 (*)    MCHC 36.2 (*)    RDW 16.7 (*)    All other components within normal limits  TROPONIN I (HIGH SENSITIVITY) - Abnormal; Notable for the following components:   Troponin I (High Sensitivity) 76 (*)    All other components within normal limits  TROPONIN I (HIGH  SENSITIVITY) - Abnormal; Notable for the following components:   Troponin I (High Sensitivity) 81 (*)    All other components within normal limits  BASIC METABOLIC PANEL  HEPATIC FUNCTION PANEL  LIPASE, BLOOD  C-REACTIVE PROTEIN    EKG EKG Interpretation Date/Time:  Tuesday January 17 2023 20:05:03 EDT Ventricular Rate:  127 PR Interval:  130 QRS Duration:  84 QT Interval:  320 QTC Calculation: 465 R Axis:   126  Text Interpretation: * Suspect arm lead reversal, interpretation assumes no reversal Sinus tachycardia Right axis deviation Anterior infarct , age undetermined Abnormal ECG When compared with ECG of 13-Jan-2023 04:05, QRS axis Shifted right when compared to prior, faster rate. No STEMI Confirmed by Theda Belfast ([id number]) on 01/17/2023 8:18:24 PM  Radiology DG Chest 2 View  Result Date: 01/17/2023 CLINICAL DATA:  Chest pain, fevers, sweating and clamminess. EXAM: CHEST - 2 VIEW COMPARISON:  01/12/2023 FINDINGS: Surgical clip projected over the heart. Mild cardiac enlargement. No vascular congestion, edema, or consolidation. No pleural effusions. No pneumothorax. Mediastinal contours appear intact. IMPRESSION: Mild cardiac enlargement.  Lungs are clear. Electronically Signed   By: Burman Nieves M.D.   On: 01/17/2023 22:15    Procedures Procedures    Medications Ordered in ED Medications  HYDROmorphone (DILAUDID) injection 0.5 mg (0.5 mg Intravenous Given 01/17/23 2207)  ondansetron (ZOFRAN) injection 4 mg (4 mg Intravenous Given 01/17/23 2259)    ED Course/ Medical Decision Making/ A&P                                 Medical Decision Making Amount and/or Complexity of Data Reviewed Labs: ordered. Radiology: ordered.  Risk Prescription drug management. Decision regarding hospitalization.    Annalycia Coryell is a 32 y.o. female with a past medical history significant for atrial fibrillation, CKD, CHF, previous MI, previous brain tumor, hypothyroidism,  diabetes with previous DKA, hypertriglyceridemia, previous pancreatitis, and recent visit to the emergency department for nausea and vomiting, abdominal pain who presents with diaphoresis, shortness of breath, and exertional/pleuritic chest pain.  Patient reports that she had done better from the nausea vomiting and abdominal discomfort from  several days ago's visit to emergency department but started having acutely worsened chest pain this morning.  She reports she has been very sweaty at times and the leads of fallen off of her from triage.  She reports she is short of breath and cannot take a deep breath.  She had a CT scan that was negative for pulmonary embolism several days ago and does not think she needs a repeat CT scan however the CT did show a new pericardial effusion.  She reports she is not had fevers today but has had the clamminess and chest discomfort.  She reports is in her central and left chest but does not radiate.  She reports no abdominal pain now.  Denies any constipation, diarrhea, or urinary changes.  She has not had this type of pain before she reports.  She does have a history of MI when she was younger and heart failure.  EKG on arrival does not show STEMI.  On my exam, patient is tachycardic with heart rates in the 130s, she is not tachypneic and not hypoxic.  Blood pressure is elevated in the 170s but it is improved from arrival to the ED.  [name] afebrile.  Lungs were clear and chest was nontender.  Abdomen was nontender.  Good pulses initially.  I [name] not appreciate a murmur.  Patient answering questions appropriately but appears uncomfortable.  She reports her pain is a 5 out of 10 in severity now.  She reports a lower dose Dilaudid has helped before although she has had some itching with higher doses in the past.  Given her description of symptoms, we had a shared decision-making conversation but repeat CT scan to look for pulm embolism and we agreed to hold on it since it was  -4 days ago.  I am somewhat concerned however given the exertional and pleuritic chest pain, shortness of breath, tachycardia, and her troponin that has returned much higher than several days ago in the 70s.  Will trend.  Will add on hepatic function lipase given the intermittent nausea and vomiting.  Will call cardiology given the rising troponin and anticipate she will need admission given the symptoms and her new pericardial effusion.  Anticipate admission for echo and further management and will speak with admitting team if they want a repeat CT PE study given the -1 several days ago.  10:40 PM Just spoke to cardiology and they agree with holding on a repeat CT scan.  They feel depending on her troponin trends to she may need more coronary CT imaging versus other cardiac workup.  They agree with getting an echo and admission to medicine given her whole picture and risen troponin.  They requested a CRP given the new pericardial effusion and her symptoms.  Will call for medicine admission to [name] so cardiology can be involved.  Medicine will admit and will wait for the metabolic panel to return.  Cardiology will see patient and will help manage.        Final Clinical Impression(s) / ED Diagnoses Final diagnoses:  Chest pain, unspecified type  Shortness of breath  Elevated troponin   Clinical Impression: 1. Chest pain, unspecified type   2. Shortness of breath   3. Elevated troponin     Disposition: Admit  This note was prepared with assistance of Dragon voice recognition software. Occasional wrong-word or sound-a-like substitutions may have occurred due to the inherent limitations of voice recognition software.        Myan Locatelli,  Canary Brim, [name] 01/17/23 2328

## 2023-01-17 NOTE — ED Triage Notes (Signed)
Chest pain earlier today. Reports some fevers at home , sweaty and chammy Recent BP med changes

## 2023-01-18 ENCOUNTER — Encounter (HOSPITAL_BASED_OUTPATIENT_CLINIC_OR_DEPARTMENT_OTHER): Payer: Self-pay | Admitting: Family Medicine

## 2023-01-18 DIAGNOSIS — I2510 Atherosclerotic heart disease of native coronary artery without angina pectoris: Secondary | ICD-10-CM | POA: Diagnosis not present

## 2023-01-18 DIAGNOSIS — I428 Other cardiomyopathies: Secondary | ICD-10-CM | POA: Diagnosis not present

## 2023-01-18 DIAGNOSIS — N184 Chronic kidney disease, stage 4 (severe): Secondary | ICD-10-CM | POA: Diagnosis not present

## 2023-01-18 DIAGNOSIS — E872 Acidosis, unspecified: Secondary | ICD-10-CM | POA: Diagnosis not present

## 2023-01-18 DIAGNOSIS — E781 Pure hyperglyceridemia: Secondary | ICD-10-CM | POA: Diagnosis not present

## 2023-01-18 DIAGNOSIS — I21A1 Myocardial infarction type 2: Secondary | ICD-10-CM | POA: Diagnosis not present

## 2023-01-18 DIAGNOSIS — R079 Chest pain, unspecified: Secondary | ICD-10-CM

## 2023-01-18 DIAGNOSIS — I3139 Other pericardial effusion (noninflammatory): Secondary | ICD-10-CM | POA: Diagnosis not present

## 2023-01-18 DIAGNOSIS — K760 Fatty (change of) liver, not elsewhere classified: Secondary | ICD-10-CM | POA: Diagnosis not present

## 2023-01-18 DIAGNOSIS — E1122 Type 2 diabetes mellitus with diabetic chronic kidney disease: Secondary | ICD-10-CM | POA: Diagnosis not present

## 2023-01-18 DIAGNOSIS — I160 Hypertensive urgency: Secondary | ICD-10-CM | POA: Diagnosis not present

## 2023-01-18 DIAGNOSIS — I5022 Chronic systolic (congestive) heart failure: Secondary | ICD-10-CM | POA: Diagnosis not present

## 2023-01-18 DIAGNOSIS — I951 Orthostatic hypotension: Secondary | ICD-10-CM

## 2023-01-18 DIAGNOSIS — E871 Hypo-osmolality and hyponatremia: Secondary | ICD-10-CM

## 2023-01-18 DIAGNOSIS — E039 Hypothyroidism, unspecified: Secondary | ICD-10-CM | POA: Diagnosis not present

## 2023-01-18 DIAGNOSIS — I4891 Unspecified atrial fibrillation: Secondary | ICD-10-CM | POA: Diagnosis not present

## 2023-01-18 DIAGNOSIS — I130 Hypertensive heart and chronic kidney disease with heart failure and stage 1 through stage 4 chronic kidney disease, or unspecified chronic kidney disease: Secondary | ICD-10-CM | POA: Diagnosis not present

## 2023-01-18 DIAGNOSIS — K861 Other chronic pancreatitis: Secondary | ICD-10-CM | POA: Diagnosis not present

## 2023-01-18 DIAGNOSIS — I10 Essential (primary) hypertension: Secondary | ICD-10-CM

## 2023-01-18 DIAGNOSIS — I081 Rheumatic disorders of both mitral and tricuspid valves: Secondary | ICD-10-CM | POA: Diagnosis not present

## 2023-01-18 DIAGNOSIS — I272 Pulmonary hypertension, unspecified: Secondary | ICD-10-CM | POA: Diagnosis not present

## 2023-01-18 DIAGNOSIS — K219 Gastro-esophageal reflux disease without esophagitis: Secondary | ICD-10-CM | POA: Diagnosis not present

## 2023-01-18 DIAGNOSIS — E860 Dehydration: Secondary | ICD-10-CM | POA: Diagnosis not present

## 2023-01-18 DIAGNOSIS — Z7689 Persons encountering health services in other specified circumstances: Secondary | ICD-10-CM | POA: Diagnosis not present

## 2023-01-18 LAB — I-STAT VENOUS BLOOD GAS, ED
Acid-base deficit: 3 mmol/L — ABNORMAL HIGH (ref 0.0–2.0)
Bicarbonate: 23.1 mmol/L (ref 20.0–28.0)
Calcium, Ion: 1.16 mmol/L (ref 1.15–1.40)
HCT: 36 % (ref 36.0–46.0)
Hemoglobin: 12.2 g/dL (ref 12.0–15.0)
O2 Saturation: 87 %
Patient temperature: 98.1
Potassium: 5.8 mmol/L — ABNORMAL HIGH (ref 3.5–5.1)
Sodium: 127 mmol/L — ABNORMAL LOW (ref 135–145)
TCO2: 24 mmol/L (ref 22–32)
pCO2, Ven: 42.1 mm[Hg] — ABNORMAL LOW (ref 44–60)
pH, Ven: 7.347 (ref 7.25–7.43)
pO2, Ven: 55 mm[Hg] — ABNORMAL HIGH (ref 32–45)

## 2023-01-18 LAB — LDL CHOLESTEROL, DIRECT: Direct LDL: UNDETERMINED mg/dL (ref 0–99)

## 2023-01-18 LAB — COMPREHENSIVE METABOLIC PANEL
ALT: 170 U/L — ABNORMAL HIGH (ref 0–44)
AST: 114 U/L — ABNORMAL HIGH (ref 15–41)
Albumin: 4.4 g/dL (ref 3.5–5.0)
Alkaline Phosphatase: 205 U/L — ABNORMAL HIGH (ref 38–126)
Anion gap: 15 (ref 5–15)
BUN: 50 mg/dL — ABNORMAL HIGH (ref 6–20)
CO2: 19 mmol/L — ABNORMAL LOW (ref 22–32)
Calcium: 9.4 mg/dL (ref 8.9–10.3)
Chloride: 99 mmol/L (ref 98–111)
Creatinine, Ser: 2.03 mg/dL — ABNORMAL HIGH (ref 0.44–1.00)
GFR, Estimated: 33 mL/min — ABNORMAL LOW (ref >60)
Glucose, Bld: 254 mg/dL — ABNORMAL HIGH (ref 70–99)
Potassium: 5.7 mmol/L — ABNORMAL HIGH (ref 3.5–5.1)
Sodium: 133 mmol/L — ABNORMAL LOW (ref 135–145)
Total Bilirubin: 0.8 mg/dL (ref 0.3–1.2)
Total Protein: 7.8 g/dL (ref 6.5–8.1)

## 2023-01-18 LAB — GLUCOSE, CAPILLARY
Glucose-Capillary: 221 mg/dL — ABNORMAL HIGH (ref 70–99)
Glucose-Capillary: 250 mg/dL — ABNORMAL HIGH (ref 70–99)
Glucose-Capillary: 206 mg/dL — ABNORMAL HIGH (ref 70–99)
Glucose-Capillary: 241 mg/dL — ABNORMAL HIGH (ref 70–99)
Glucose-Capillary: 172 mg/dL — ABNORMAL HIGH (ref 70–99)
Glucose-Capillary: 185 mg/dL — ABNORMAL HIGH (ref 70–99)
Glucose-Capillary: 161 mg/dL — ABNORMAL HIGH (ref 70–99)

## 2023-01-18 LAB — URINALYSIS, ROUTINE W REFLEX MICROSCOPIC
Bilirubin Urine: NEGATIVE
Bilirubin Urine: NEGATIVE
Glucose, UA: 500 mg/dL — AB
Glucose, UA: 150 mg/dL — AB
Ketones, ur: NEGATIVE mg/dL
Ketones, ur: NEGATIVE mg/dL
Leukocytes,Ua: NEGATIVE
Leukocytes,Ua: NEGATIVE
Nitrite: NEGATIVE
Nitrite: NEGATIVE
Protein, ur: 100 mg/dL — AB
Protein, ur: 100 mg/dL — AB
Specific Gravity, Urine: 1.012 (ref 1.005–1.030)
Specific Gravity, Urine: 1.009 (ref 1.005–1.030)
pH: 5.5 (ref 5.0–8.0)
pH: 5 (ref 5.0–8.0)

## 2023-01-18 LAB — BASIC METABOLIC PANEL
Anion gap: 20 — ABNORMAL HIGH (ref 5–15)
Anion gap: 19 — ABNORMAL HIGH (ref 5–15)
BUN: 40 mg/dL — ABNORMAL HIGH (ref 6–20)
BUN: 48 mg/dL — ABNORMAL HIGH (ref 6–20)
CO2: 13 mmol/L — ABNORMAL LOW (ref 22–32)
CO2: 13 mmol/L — ABNORMAL LOW (ref 22–32)
Calcium: 9.1 mg/dL (ref 8.9–10.3)
Calcium: 9.7 mg/dL (ref 8.9–10.3)
Chloride: 94 mmol/L — ABNORMAL LOW (ref 98–111)
Chloride: 87 mmol/L — ABNORMAL LOW (ref 98–111)
Creatinine, Ser: 1.04 mg/dL — ABNORMAL HIGH (ref 0.44–1.00)
Creatinine, Ser: 2.11 mg/dL — ABNORMAL HIGH (ref 0.44–1.00)
GFR, Estimated: >60 mL/min (ref >60)
GFR, Estimated: 32 mL/min — ABNORMAL LOW (ref >60)
Glucose, Bld: 315 mg/dL — ABNORMAL HIGH (ref 70–99)
Glucose, Bld: 351 mg/dL — ABNORMAL HIGH (ref 70–99)
Potassium: 5.5 mmol/L — ABNORMAL HIGH (ref 3.5–5.1)
Potassium: 5.3 mmol/L — ABNORMAL HIGH (ref 3.5–5.1)
Sodium: 127 mmol/L — ABNORMAL LOW (ref 135–145)
Sodium: 119 mmol/L — CL (ref 135–145)

## 2023-01-18 LAB — HEPATIC FUNCTION PANEL
ALT: UNDETERMINED U/L (ref 0–44)
AST: UNDETERMINED U/L (ref 15–41)
Albumin: 3.9 g/dL (ref 3.5–5.0)
Alkaline Phosphatase: 210 U/L — ABNORMAL HIGH (ref 38–126)
Bilirubin, Direct: 0.2 mg/dL (ref 0.0–0.2)
Indirect Bilirubin: 1.1 mg/dL — ABNORMAL HIGH (ref 0.3–0.9)
Total Bilirubin: 1.3 mg/dL — ABNORMAL HIGH (ref 0.3–1.2)
Total Protein: UNDETERMINED g/dL (ref 6.5–8.1)

## 2023-01-18 LAB — LIPID PANEL
Cholesterol: 957 mg/dL — ABNORMAL HIGH (ref 0–200)
LDL Cholesterol: UNDETERMINED mg/dL (ref 0–99)
Triglycerides: >5000 mg/dL — ABNORMAL HIGH (ref <150)
VLDL: UNDETERMINED mg/dL (ref 0–40)

## 2023-01-18 LAB — URIC ACID: Uric Acid, Serum: 5.7 mg/dL (ref 2.5–7.1)

## 2023-01-18 LAB — CBG MONITORING, ED
Glucose-Capillary: 276 mg/dL — ABNORMAL HIGH (ref 70–99)
Glucose-Capillary: 295 mg/dL — ABNORMAL HIGH (ref 70–99)

## 2023-01-18 LAB — LIPASE, BLOOD: Lipase: 25 U/L (ref 11–51)

## 2023-01-18 LAB — BETA-HYDROXYBUTYRIC ACID: Beta-Hydroxybutyric Acid: <0.05 mmol/L — ABNORMAL LOW (ref 0.05–0.27)

## 2023-01-18 LAB — MAGNESIUM: Magnesium: 2.3 mg/dL (ref 1.7–2.4)

## 2023-01-18 LAB — CREATININE, URINE, RANDOM: Creatinine, Urine: 40 mg/dL

## 2023-01-18 LAB — C-REACTIVE PROTEIN
CRP: 1.2 mg/dL — ABNORMAL HIGH (ref <1.0)
CRP: 1.4 mg/dL — ABNORMAL HIGH (ref <1.0)

## 2023-01-18 LAB — BRAIN NATRIURETIC PEPTIDE: B Natriuretic Peptide: 50.4 pg/mL (ref 0.0–100.0)

## 2023-01-18 LAB — SODIUM, URINE, RANDOM: Sodium, Ur: 69 mmol/L

## 2023-01-18 LAB — OSMOLALITY, URINE: Osmolality, Ur: 357 mosm/kg (ref 300–900)

## 2023-01-18 MED ORDER — AMITRIPTYLINE HCL 10 MG PO TABS
10.0000 mg | ORAL_TABLET | Freq: Every day | ORAL | Status: DC
  Administered 2023-01-18 – 2023-01-19 (×2): 10 mg via ORAL
  Filled 2023-01-18 (×3): qty 1

## 2023-01-18 MED ORDER — DIPHENHYDRAMINE HCL 50 MG/ML IJ SOLN
25.0000 mg | Freq: Once | INTRAMUSCULAR | Status: AC
  Administered 2023-01-18: 25 mg via INTRAVENOUS
  Filled 2023-01-18: qty 1

## 2023-01-18 MED ORDER — INSULIN ASPART 100 UNIT/ML IJ SOLN
0.0000 [IU] | Freq: Three times a day (TID) | INTRAMUSCULAR | Status: DC
  Administered 2023-01-18: 8 [IU] via SUBCUTANEOUS

## 2023-01-18 MED ORDER — LEVOTHYROXINE SODIUM 25 MCG PO TABS
137.0000 ug | ORAL_TABLET | Freq: Every day | ORAL | Status: DC
  Administered 2023-01-19 – 2023-01-20 (×2): 137 ug via ORAL
  Filled 2023-01-18 (×2): qty 1

## 2023-01-18 MED ORDER — HYDRALAZINE HCL 20 MG/ML IJ SOLN
10.0000 mg | Freq: Four times a day (QID) | INTRAMUSCULAR | Status: DC | PRN
  Administered 2023-01-18: 10 mg via INTRAVENOUS
  Filled 2023-01-18: qty 1

## 2023-01-18 MED ORDER — TOPIRAMATE 25 MG PO TABS
50.0000 mg | ORAL_TABLET | Freq: Two times a day (BID) | ORAL | Status: DC
  Administered 2023-01-18 – 2023-01-20 (×4): 50 mg via ORAL
  Filled 2023-01-18 (×5): qty 2

## 2023-01-18 MED ORDER — SODIUM ZIRCONIUM CYCLOSILICATE 10 G PO PACK
10.0000 g | PACK | Freq: Two times a day (BID) | ORAL | Status: AC
  Administered 2023-01-18 – 2023-01-19 (×2): 10 g via ORAL
  Filled 2023-01-18 (×3): qty 1

## 2023-01-18 MED ORDER — ACETAMINOPHEN 325 MG PO TABS: 650.0000 mg | ORAL_TABLET | Freq: Four times a day (QID) | ORAL | Status: DC | PRN

## 2023-01-18 MED ORDER — DEXTROSE 5 % IV SOLN: INTRAVENOUS | Status: DC

## 2023-01-18 MED ORDER — HYDRALAZINE HCL 50 MG PO TABS
50.0000 mg | ORAL_TABLET | Freq: Three times a day (TID) | ORAL | Status: DC
  Administered 2023-01-18 – 2023-01-20 (×6): 50 mg via ORAL
  Filled 2023-01-18 (×6): qty 1

## 2023-01-18 MED ORDER — SODIUM BICARBONATE 650 MG PO TABS: 650.0000 mg | ORAL_TABLET | Freq: Two times a day (BID) | ORAL | Status: DC

## 2023-01-18 MED ORDER — ASPIRIN 81 MG PO TBEC
81.0000 mg | DELAYED_RELEASE_TABLET | Freq: Every day | ORAL | Status: DC
  Administered 2023-01-18 – 2023-01-20 (×3): 81 mg via ORAL
  Filled 2023-01-18 (×3): qty 1

## 2023-01-18 MED ORDER — ICOSAPENT ETHYL 1 G PO CAPS: 2.0000 g | ORAL_CAPSULE | Freq: Two times a day (BID) | ORAL | Status: DC

## 2023-01-18 MED ORDER — ONDANSETRON HCL 4 MG/2ML IJ SOLN: 4.0000 mg | Freq: Four times a day (QID) | INTRAMUSCULAR | Status: DC | PRN

## 2023-01-18 MED ORDER — FENOFIBRATE 160 MG PO TABS
160.0000 mg | ORAL_TABLET | Freq: Every day | ORAL | Status: DC
  Administered 2023-01-19 – 2023-01-20 (×2): 160 mg via ORAL
  Filled 2023-01-18 (×2): qty 1

## 2023-01-18 MED ORDER — PANTOPRAZOLE SODIUM 40 MG PO TBEC
40.0000 mg | DELAYED_RELEASE_TABLET | Freq: Every day | ORAL | Status: DC
  Administered 2023-01-18 – 2023-01-20 (×3): 40 mg via ORAL
  Filled 2023-01-18 (×3): qty 1

## 2023-01-18 MED ORDER — ROSUVASTATIN CALCIUM 20 MG PO TABS
40.0000 mg | ORAL_TABLET | Freq: Every day | ORAL | Status: DC
  Administered 2023-01-18 – 2023-01-20 (×3): 40 mg via ORAL
  Filled 2023-01-18 (×3): qty 2

## 2023-01-18 MED ORDER — IPRATROPIUM-ALBUTEROL 0.5-2.5 (3) MG/3ML IN SOLN: 3.0000 mL | Freq: Four times a day (QID) | RESPIRATORY_TRACT | Status: DC | PRN

## 2023-01-18 MED ORDER — CARVEDILOL 3.125 MG PO TABS
3.1250 mg | ORAL_TABLET | Freq: Two times a day (BID) | ORAL | Status: DC
  Administered 2023-01-18 – 2023-01-20 (×4): 3.125 mg via ORAL
  Filled 2023-01-18 (×4): qty 1

## 2023-01-18 MED ORDER — PANCRELIPASE (LIP-PROT-AMYL) 12000-38000 UNITS PO CPEP
36000.0000 [IU] | ORAL_CAPSULE | Freq: Three times a day (TID) | ORAL | Status: DC
  Administered 2023-01-18 – 2023-01-20 (×4): 36000 [IU] via ORAL
  Filled 2023-01-18 (×5): qty 3

## 2023-01-18 MED ORDER — DIPHENHYDRAMINE HCL 25 MG PO CAPS
25.0000 mg | ORAL_CAPSULE | Freq: Four times a day (QID) | ORAL | Status: DC | PRN
  Administered 2023-01-18 – 2023-01-19 (×2): 25 mg via ORAL
  Filled 2023-01-18 (×2): qty 1

## 2023-01-18 MED ORDER — BUTALBITAL-APAP-CAFFEINE 50-325-40 MG PO TABS: 1.0000 | ORAL_TABLET | Freq: Every day | ORAL | Status: DC | PRN

## 2023-01-18 MED ORDER — LACTATED RINGERS IV SOLN: INTRAVENOUS | Status: DC

## 2023-01-18 MED ORDER — LORAZEPAM 1 MG PO TABS
0.5000 mg | ORAL_TABLET | Freq: Once | ORAL | Status: AC
  Administered 2023-01-18: 0.5 mg via SUBLINGUAL
  Filled 2023-01-18: qty 1

## 2023-01-18 MED ORDER — TOPIRAMATE 25 MG PO TABS: 50.0000 mg | ORAL_TABLET | Freq: Two times a day (BID) | ORAL | Status: DC

## 2023-01-18 MED ORDER — INSULIN ASPART 100 UNIT/ML IJ SOLN: 0.0000 [IU] | Freq: Every day | INTRAMUSCULAR | Status: DC

## 2023-01-18 MED ORDER — HYDROMORPHONE HCL 1 MG/ML IJ SOLN
0.5000 mg | Freq: Once | INTRAMUSCULAR | Status: AC
  Administered 2023-01-18: 0.5 mg via INTRAVENOUS
  Filled 2023-01-18: qty 1

## 2023-01-18 MED ORDER — PREGABALIN 100 MG PO CAPS
300.0000 mg | ORAL_CAPSULE | Freq: Every day | ORAL | Status: DC
  Administered 2023-01-18 – 2023-01-19 (×2): 300 mg via ORAL
  Filled 2023-01-18 (×2): qty 3

## 2023-01-18 MED ORDER — HYDROCODONE-ACETAMINOPHEN 5-325 MG PO TABS
1.0000 | ORAL_TABLET | Freq: Once | ORAL | Status: AC
  Administered 2023-01-18: 1 via ORAL
  Filled 2023-01-18: qty 1

## 2023-01-18 MED ORDER — SODIUM BICARBONATE 650 MG PO TABS
650.0000 mg | ORAL_TABLET | Freq: Three times a day (TID) | ORAL | Status: DC
  Administered 2023-01-18 – 2023-01-20 (×6): 650 mg via ORAL
  Filled 2023-01-18 (×6): qty 1

## 2023-01-18 MED ORDER — INSULIN (MYXREDLIN) INFUSION FOR HYPERTRIGLYCERIDEMIA
0.1000 [IU]/kg/h | INTRAVENOUS | Status: DC
Start: 2023-01-18 — End: 2023-01-19
  Administered 2023-01-18: 0.1 [IU]/kg/h via INTRAVENOUS
  Filled 2023-01-18: qty 100

## 2023-01-18 MED ORDER — DIPHENHYDRAMINE HCL 25 MG PO CAPS
50.0000 mg | ORAL_CAPSULE | Freq: Once | ORAL | Status: AC
  Administered 2023-01-18: 50 mg via ORAL
  Filled 2023-01-18: qty 2

## 2023-01-18 MED ORDER — DOCUSATE SODIUM 100 MG PO CAPS
200.0000 mg | ORAL_CAPSULE | Freq: Two times a day (BID) | ORAL | Status: DC
  Filled 2023-01-18 (×2): qty 2

## 2023-01-18 NOTE — ED Provider Notes (Signed)
Patient reassessed this morning, boarding awaiting bed at [facility].  Admitting hospitalist updated and requested insulin orders and patient did not have insulin last night either.  Spoke with Dr. Allena Katz who will address this.    On examination patient overall feels okay no vomiting, no pain, no confusion, heart rate 105.   Blood work was resent however lab called with concern for air with high lipid history and patient in the past has had to have it sent to [facility] for them to run it so that is accurate.  The patient has had DKA before.  Point-of-care glucose in the 200s.  Kenton Kingfisher, MD 01/18/23 1028

## 2023-01-18 NOTE — Plan of Care (Signed)

## 2023-01-18 NOTE — Progress Notes (Signed)
Rt ran a VBG and noticed after the results that it was arterial. The nurse and doctor were notified

## 2023-01-18 NOTE — Consult Note (Signed)
Cardiology Consultation   Patient ID: Jennifer [name] MRN: [id number]; DOB: 09/19/1990  Admit date: 01/17/2023 Date of Consult: 01/18/2023  PCP:  Nathaneil Canary, PA-C   [facility] Providers Cardiologist:  Yates Decamp, MD     Patient Profile:   Jennifer [name] is a 32 y.o. 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  History of Present Illness:   Jennifer [name] is a 32 year old female with above medical history.  Per chart review, patient was admitted in 10/2020 with abdominal pain and diarrhea.  Abdominal CT was concerning for vasculitis.  Her triglycerides were found to be elevated to 4030.  She was started on insulin/dextrose infusion.  Underwent echocardiogram on 11/28/2020 that showed EF 25-30%, grade 2 diastolic dysfunction, mildly reduced RV function, mildly elevated pulmonary artery systolic pressure, mild-moderate mitral valve regurgitation, moderate-severe tricuspid valve regurgitation.  Underwent cardiac MRI on 12/01/2020 that showed mid wall late gadolinium enhancement in basal-mid anterior/anterolateral walls.  Differential diagnosis included prior myocarditis versus sarcoidosis.  Underwent right/left heart catheterization on 11/03/2020 that showed a small RCA that was diffusely diseased around 80% with mid segment 99% occlusion.  Conus branch collateralized distal RCA.  It was suspected that patient had nonischemic cardiomyopathy, as a single vessel occlusion did not explain her markedly reduced EF.  Recommended medical therapy for CAD and nonischemic cardiomyopathy.  She was started on GDMT.  After patient was discharged, she followed up with a cardiologist with [facility] for second opinion.  He ordered a  LifeVest.  Follow-up echo in 12/2020 showed EF had improved to 45-50% with grade 2 DD, normal RV function.  LifeVest removed.  In 07/2021, patient gained weight and had ReDs 27%.  Arrange for right heart cath to better assess hemodynamics.  Underwent right heart catheterization that showed elevated filling pressures, normal cardiac output, mild-moderate mixed pulmonary hypertension with prominent V waves in RA, suggestive of significant TR.  Her diuretic regimen was titrated.  Later in 07/2021, she was admitted with AKI and hyperkalemia.  Given IV fluids, insulin, calcium gluconate, Lokelma.  Underwent echocardiogram 08/15/2021 that showed EF 45-50%, normal RV function, mild-moderate mitral valve regurgitation, mild aortic valve regurgitation.  Patient was seen in 09/2021 by [facility] cardiology for second opinion.  Her diuretics were adjusted.  Patient follows with Dr. Cliffton Asters at [facility] and had renal biopsy on 12/31/2021 that showed diabetic and hypertension nephropathy.  They discussed possibility of starting hemodialysis.  In the meantime, patient has had multiple admissions for hyperglycemia, hypertriglyceridemia.  Patient follows with Dr. Anselmo Rod at [facility] for endocrinology.   Patient was admitted in 05/2022 with hypertriglyceridemia and DKA.  She was treated with IV diuresis and creatinine became elevated.  Co. ox 51, 48.  She was started on milrinone.  Echocardiogram on 06/04/2022 that showed EF 30-35%, normal RV function, mildly elevated pulmonary artery pressure, small pericardial effusion, moderate-severe mitral valve regurgitation, severe tricuspid valve regurgitation.  She was able to be weaned off milrinone by 3/14.  Repeat echo on 06/10/2022 showed EF 45/50%, normal RV function, normal pulmonary artery systolic pressure, small pericardial effusion, moderate MR, mild-moderate TR.  Patient was ultimately discharged on torsemide 80 mg daily, spironolactone 25 mg daily, hydralazine 50 mg 3 times daily, ivabradine 7.5  twice daily, sildenafil.  Patient last saw  Dr. Gala Romney on 10/04/2022.  At that time, echo showed EF 50-55%, no regional wall motion abnormalities, normal RV function, mild MR.  Patient was on carvedilol 3.125 mg twice daily, torsemide 40 mg daily, Spyro 25 mg daily, ivabradine 7.5 mg twice daily, aspirin, statin, Repatha.  Patient was seen in the ED on 10/7 for dehydration and epigastric pain.  Also seen in the ED on 10/9 for fall with abrasion of the scalp.  Seen in the ED on 10/13 with dizziness.  Seen in the ED on 10/17 with epigastric pain.  Patient presented to the ED at [facility] on 10/22 reporting that she had chest pain earlier that day.  Also reported having fevers at home, feeling sweaty and clammy.  Labs in the ED showed K5.5, creatinine 1.04, alk phosphatase 210, WBC 7.7, hemoglobin 12.1, platelets 220.  High-sensitivity troponin 76, 81.  Triglycerides greater than 5000.  CRP 1.2.  Chest x-ray showed mild cardiac enlargement without active cardiopulmonary disease.  On interview, patient reports that she has been in her usual state of health recently.  Yesterday, she noticed that her blood pressure was elevated to the 190s over 80s.  She developed chest pain that was located in the middle of her chest.  Pain felt like soreness or an ache.  Patient did not notice any aggravating or alleviating factors.  Pain was not worse with palpation, did not change based on body position.  She denies associated symptoms.  Patient reports that she has been having chest pain and shortness of breath on exertion consistently for "her whole life".  Reports that the symptoms have been present since before she had her cardiac catheterization in 2022.  Her symptoms have not changed since that time.  Currently, patient is chest pain-free.  Reports that her breathing is stable.  Denies ankle edema.   Past Medical History:  Diagnosis Date   Afib (HCC) 05/12/2021   Brain tumor (HCC) 03/29/1995   astrocytoma    CHF (congestive heart failure) (HCC)    Cholesterosis    CKD (chronic kidney disease) stage 4, GFR 15-29 ml/min (HCC) 05/13/2021   DM (diabetes mellitus) (HCC) 10/10/2018   Fatty liver    HTN (hypertension) 10/10/2018   Hypothyroidism 10/10/2018   Lipoprotein deficiency    Lung disease    longevity long term   Pancreatitis    Polycystic ovary syndrome     Past Surgical History:  Procedure Laterality Date   ABDOMINAL SURGERY     pt states during miscarriage got her intestine   BRAIN SURGERY     EYE MUSCLE SURGERY Right 03/28/2014   PRESSURE SENSOR/CARDIOMEMS N/A 12/06/2021   Procedure: PRESSURE SENSOR/CARDIOMEMS;  Surgeon: Dolores Patty, MD;  Location: [facility];  Service: Cardiovascular;  Laterality: N/A;   RIGHT HEART CATH N/A 08/05/2021   Procedure: RIGHT HEART CATH;  Surgeon: Dolores Patty, MD;  Location: [facility];  Service: Cardiovascular;  Laterality: N/A;   RIGHT HEART CATH N/A 12/06/2021   Procedure: RIGHT HEART CATH;  Surgeon: Dolores Patty, MD;  Location: [facility];  Service: Cardiovascular;  Laterality: N/A;   RIGHT/LEFT HEART CATH AND CORONARY ANGIOGRAPHY N/A 12/03/2020   Procedure: RIGHT/LEFT HEART CATH AND CORONARY ANGIOGRAPHY;  Surgeon: Yates Decamp, MD;  Location: [facility];  Service: Cardiovascular;  Laterality: N/A;   VENTRICULOSTOMY  03/28/1997     Home Medications:  Prior to Admission medications   Medication Sig Start Date End Date Taking? Authorizing Provider  amitriptyline (ELAVIL) 10 MG tablet Take 10 mg by mouth at bedtime.   Yes [provider]  aspirin EC 81 MG EC tablet Take 1 tablet (81 mg total) by mouth daily. Swallow whole. 12/07/20  Yes Pennie Banter, DO  butalbital-acetaminophen-caffeine (FIORICET) 50-325-40 MG tablet Take 1 tablet by mouth daily as needed for headache. 09/26/22  Yes [provider]  CREON 36000-114000 units CPEP capsule Take 36,000 Units by mouth 3 (three) times  daily.   Yes [provider]  dicyclomine (BENTYL) 20 MG tablet Take 1 tablet (20 mg total) by mouth 2 (two) times daily. Patient taking differently: Take 20 mg by mouth as needed for spasms. 09/15/22  Yes [name], [name], MD  DULoxetine (CYMBALTA) 60 MG capsule Take 60 mg by mouth at bedtime. 09/26/22  Yes [provider]  ergocalciferol (VITAMIN D2) 1.25 MG (50000 UT) capsule Take 50,000 Units by mouth every 7 (seven) days. Every Sunday   Yes [provider]  Evolocumab (REPATHA) 140 MG/ML SOSY Inject 140 mg into the skin See admin instructions. Inject 140 mg subcutaneously every other Thursday evening   Yes [provider]  fenofibrate 54 MG tablet Take 1 tablet (54 mg total) by mouth daily. 12/01/22  Yes [name], [name], MD  hydrALAZINE (APRESOLINE) 50 MG tablet Take 50 mg by mouth 3 (three) times daily. 11/16/22  Yes [provider]  insulin lispro (HUMALOG) 100 UNIT/ML injection Inject 20 Units into the skin 3 (three) times daily before meals.   Yes [provider]  Insulin Pen Needle 32G X 4 MM MISC 1 Device by Does not apply route QID. For use with insulin pens 02/13/22  Yes [name], MD  insulin regular human CONCENTRATED (HUMULIN R U-500 KWIKPEN) 500 UNIT/ML KwikPen Inject 185 Units into the skin 3 (three) times daily with meals. Patient taking differently: Inject 195 Units into the skin 3 (three) times daily with meals. 09/20/22 01/09/24 Yes [name], MD  levocetirizine (XYZAL) 5 MG tablet Take 5 mg by mouth daily. 03/05/22  Yes [provider]  levothyroxine (SYNTHROID) 137 MCG tablet Take 137 mcg by mouth daily. 09/20/22  Yes [provider]  pantoprazole (PROTONIX) 40 MG tablet Take 40 mg by mouth daily. 12/24/19  Yes [provider]  pregabalin (LYRICA) 300 MG capsule Take 300 mg by mouth at bedtime.   Yes [provider]  prochlorperazine (COMPAZINE) 5 MG tablet Take 5 mg by mouth at  bedtime.   Yes [provider]  rosuvastatin (CRESTOR) 40 MG tablet Take 40 mg by mouth daily. 08/09/21  Yes [provider]  sodium bicarbonate 650 MG tablet Take 650 mg by mouth 2 (two) times daily.   Yes [provider]  topiramate (TOPAMAX) 50 MG tablet Take 50 mg by mouth 2 (two) times daily.   Yes [provider]  torsemide (DEMADEX) 20 MG tablet Take 2 tablets (40 mg total) by mouth daily. May take 1 additional tablet as needed 10/05/22  Yes [name], [name], MD  tretinoin (RETIN-A) 0.05 % cream Apply 1 application topically at bedtime. 01/17/21  Yes [provider]  carvedilol (COREG) 3.125 MG tablet Take 1 tablet (3.125 mg total) by mouth 2 (two) times daily. Patient not taking: Reported on 01/12/2023 10/04/22   [name], [name], MD  ivabradine (CORLANOR) 7.5 MG TABS tablet Take 1 tablet (7.5 mg total) by mouth 2 (two) times daily with a meal. Patient not taking: Reported on 01/09/2023 08/11/22  [name], MD  VASCEPA 1 g capsule Take 2 capsules (2 g total) by mouth 2 (two) times daily. 12/01/22   [name], MD    Inpatient Medications: Scheduled Meds:  amitriptyline  10 mg Oral QHS   aspirin EC  81 mg Oral Daily   carvedilol  3.125 mg Oral BID   [START ON 01/19/2023] fenofibrate  160 mg Oral Daily   hydrALAZINE  50 mg Oral TID   [START ON 01/19/2023] levothyroxine  137 mcg Oral Daily   lipase/protease/amylase  36,000 Units Oral TID   pantoprazole  40 mg Oral Daily   rosuvastatin  40 mg Oral Daily   sodium bicarbonate  650 mg Oral TID   sodium zirconium cyclosilicate  10 g Oral BID   topiramate  50 mg Oral BID   Continuous Infusions:  dextrose     insulin     lactated ringers     PRN Meds: butalbital-acetaminophen-caffeine, hydrALAZINE  Allergies:    Allergies  Allergen Reactions   Ketamine Other (See Comments)    In vegetative state for 15 minutes per pt   [place] Itching    Itchy throat  Itchy throat  Itchy throat Itchy throat Itchy throat    Itchy throat  Itchy throat Itchy throat Itchy throat Itchy throat    Itchy throat   Morphine Itching, Rash and Hives    Sorethroat   Penicillins Hives, Itching and Rash    Has patient had a PCN reaction causing immediate rash, facial/tongue/throat swelling, SOB or lightheadedness with hypotension: Y Has patient had a PCN reaction causing severe rash involving mucus membranes or skin necrosis: Y Has patient had a PCN reaction that required hospitalization: N Has patient had a PCN reaction occurring within the last 10 years: Y   Clarithromycin Other (See Comments)    Stomach pain   Erythromycin     Stomach pain   Fentanyl Nausea And Vomiting   Penicillin V Hives, Itching and Other (See Comments)    Gi upset, also   Shellfish Allergy Other (See Comments)    Pt has never had shellfish but tested positive on allergy test. Pt states contrast in CT is okay   Prednisone Rash    Social History:   Social History   Socioeconomic History   Marital status: Divorced    Spouse name: Not on file   Number of children: 0   Years of education: Not on file   Highest education level: Not on file  Occupational History   Occupation: [employer]  Tobacco Use   Smoking status: Never   Smokeless tobacco: Never  Vaping Use   Vaping status: Never Used  Substance and Sexual Activity   Alcohol use: Never   Drug use: Never   Sexual activity: Yes  Other Topics Concern   Not on file  Social History Narrative   Not on file   Social Determinants of Health   Financial Resource Strain: Low Risk  (09/01/2022)   Received from [facility], [facility]   Overall Financial Resource Strain (CARDIA)    Difficulty of Paying Living Expenses: Not hard at all  Food Insecurity: No Food Insecurity (01/18/2023)   Hunger Vital Sign    Worried About Running Out of Food in the Last Year: Never true    Ran Out of Food in the Last Year: Never true  Transportation  Needs: No Transportation Needs (01/18/2023)   PRAPARE - Administrator, Civil Service (Medical): No  Lack of Transportation (Non-Medical): No  Physical Activity: Inactive (12/01/2022)   Exercise Vital Sign    Days of Exercise per Week: 0 days    Minutes of Exercise per Session: 0 min  Stress: No Stress Concern Present (09/01/2022)   Received from [facility], [facility] of Occupational Health - Occupational Stress Questionnaire    Feeling of Stress : Not at all  Social Connections: Socially Integrated (09/01/2022)   Received from [facility], [facility]   Social Network    How would you rate your social network (family, work, friends)?: Good participation with social networks  Intimate Partner Violence: Not At Risk (01/18/2023)   Humiliation, Afraid, Rape, and Kick questionnaire    Fear of Current or Ex-Partner: No    Emotionally Abused: No    Physically Abused: No    Sexually Abused: No    Family History:    Family History  Problem Relation Age of Onset   Diabetes Mother    Hypertension Mother    Hyperlipidemia Mother    Thyroid disease Mother    Hypertension Father    Diabetes Father    Pancreatic cancer Paternal Aunt    Pancreatic cancer Paternal Uncle    Colon cancer Neg Hx    Esophageal cancer Neg Hx    Stomach cancer Neg Hx      ROS:  Please see the history of present illness.   All other ROS reviewed and negative.     Physical Exam/Data:   Vitals:   01/18/23 0900 01/18/23 1150 01/18/23 1156 01/18/23 1528  BP: (!) 171/113  (!) 163/111 (!) 159/102  Pulse: 100  (!) 107 (!) 107  Resp: 19  18 20   Temp:  98.2 F (36.8 C)  98.3 F (36.8 C)  TempSrc:  Oral  Oral  SpO2: 93%  99% 97%   No intake or output data in the 24 hours ending 01/18/23 1639    01/12/2023    6:16 PM 01/08/2023   11:35 PM 01/04/2023    9:00 AM  Last 3 Weights  Weight (lbs) 130 lb 130 lb 145 lb 1 oz  Weight (kg) 58.968 kg 58.968 kg 65.8 kg      There is no height or weight on file to calculate BMI.  General:  Well nourished, well developed, in no acute distress. Sitting upright in the bed  HEENT: normal Neck: no JVD Cardiac:  normal S1, S2; Regular rhythm, tachycardic. no murmurs  Lungs:  clear to auscultation bilaterally, no wheezing, rhonchi or rales. Normal work of breathing on room air  Abd: soft, nontender, no hepatomegaly  Ext: no edema in bLE  Musculoskeletal:  No deformities, Weakness in LLE and LUE  Skin: warm and dry  Neuro:  no focal abnormalities noted Psych:  Normal affect   EKG:  The EKG was personally reviewed and demonstrates:  Sinus tachycardia with HR 127 BPM,  Telemetry:  Telemetry was personally reviewed and demonstrates:  Sinus tachycardia with HR in the 100s-120s  Relevant CV Studies:   Laboratory Data:  High Sensitivity Troponin:   Recent Labs  Lab 01/13/23 0053 01/13/23 0253 01/13/23 0412 01/17/23 2001 01/17/23 2207  TROPONINIHS 18* 22* 19* 76* 81*     Chemistry Recent Labs  Lab 01/12/23 2130 01/13/23 0053 01/17/23 2001 01/18/23 0857 01/18/23 0918  NA 127*  --  127* 119* 127*  K 6.0*   < > 5.5* 5.3* 5.8*  CL 99  --  94* 87*  --  CO2 18*  --  13* 13*  --   GLUCOSE 243*  --  315* 351*  --   BUN 36*  --  40* 48*  --   CREATININE 1.53*  --  1.04* 2.11*  --   CALCIUM 8.8*  --  9.1 9.7  --   GFRNONAA 46*  --  >60 32*  --   ANIONGAP 10  --  20* 19*  --    < > = values in this interval not displayed.    Recent Labs  Lab 01/13/23 0053 01/17/23 2001  PROT 5.8* UNABLE TO DETERMINE DUE TO LIPEMIA  ALBUMIN 4.1 3.9  AST 157* UNABLE TO DETERMINE DUE TO LIPEMIA  ALT 114* UNABLE TO DETERMINE DUE TO LIPEMIA  ALKPHOS 161* 210*  BILITOT 1.5* 1.3*   Lipids  Recent Labs  Lab 01/18/23 0857  CHOL 957*  TRIG >5,000*  HDL NOT REPORTED DUE TO HIGH TRIGLYCERIDES  LDLCALC UNABLE TO CALCULATE IF TRIGLYCERIDE OVER 400 mg/dL  CHOLHDL NOT REPORTED DUE TO HIGH TRIGLYCERIDES     Hematology Recent Labs  Lab 01/12/23 2130 01/17/23 2001 01/18/23 0918  WBC 7.1 7.7  --   RBC 3.37* 3.74*  --   HGB 10.2* 12.1 12.2  HCT 31.7* 33.4* 36.0  MCV 94.1 89.3  --   MCH 30.3 32.4  --   MCHC 32.2 36.2*  --   RDW 17.1* 16.7*  --   PLT 211 220  --    Thyroid No results for input(s): "TSH", "FREET4" in the last 168 hours.  BNPNo results for input(s): "BNP", "PROBNP" in the last 168 hours.  DDimer No results for input(s): "DDIMER" in the last 168 hours.   Radiology/Studies:  DG Chest 2 View  Result Date: 01/17/2023 CLINICAL DATA:  Chest pain, fevers, sweating and clamminess. EXAM: CHEST - 2 VIEW COMPARISON:  01/12/2023 FINDINGS: Surgical clip projected over the heart. Mild cardiac enlargement. No vascular congestion, edema, or consolidation. No pleural effusions. No pneumothorax. Mediastinal contours appear intact. IMPRESSION: Mild cardiac enlargement.  Lungs are clear. Electronically Signed   By: Burman Nieves M.D.   On: 01/17/2023 22:15     Assessment and Plan:   Chest Pain  Elevated Troponin  - Patient reports having chronic chest pain on exertion for "her whole life". Underwent cath in 2022 that showed single vessel CAD with 99% occlusion in a small RCA. Overall, felt that this single vessel disease did not explain her reduced EF. Chest pain on exertion has not changed since that time  - Had an episode of chest pain at rest yesterday when her BP was elevated to the 190s/80s  - EKG in the ED nonischemic. hsTn 76, 81. Patient has been chest pain free for over 10 hours at this time. Note, trops were elevated in the setting of very high BP- 201/127  - Overall, chest pain sounds atypical for a cardiac source. Trop trend is not consistent with ACS. Suspect chest pain was due to elevated BP. Could consider outpatient stress test if symptoms recur with adequate HTN management. Will discuss with MD for final recommendations  - Continue ASA, carvedilol, crestor  Chronic  Diastolic Heart Failure  - Previously had EF 25-30% in 2022. Most recent echocardiogram from 10/04/22 showed EF 50-55%, no regional wall motion abnormalities, normal RV function, normal pulmonary artery systolic pressure, mild MR - Appears euvolemic (even dry) on exam  - [name] receiving gentle IV fluids for her dehydration. Follow volume status closely - Continue  carvedilol 3.125 mg BID, hydralazine 50 mg TID  - Home Torsemide held due to dehydration, AKI - As AKI and dehydration resolve, attempt to get on spiro vs ARB/ARNI  - Plan to resume Ivabradine when renal function and electrolytes stabilize   HTN  HTN Urgency  - Patient reports having BP elevated to the 190s/80s at home. Reports her PCP recently took her off some of her BP medication  - BP was 201/127 on arrival  - Now on carvedilol, hydralazine- BP is improving. As above, as AKI/dehydration improve, would like to get her on GDMT with either spiro or ARB/ARNI   Otherwise per primary  - Severe hypertriglyceridemia  - Remote history of astrocytoma  - Chronic pancreatitis  - Hypothyroidism  - GERD - DM. Dehydration, pseudohyponatremia   Risk Assessment/Risk Scores:    New York Heart Association (NYHA) Functional Class NYHA Class II     For questions or updates, please contact [facility] Please consult [email or web address] for contact info under    Signed, Jonita Albee, PA-C  01/18/2023 4:39 PM

## 2023-01-18 NOTE — Progress Notes (Signed)
[facility] TELEPHONE ENCOUNTER NOTE  Patient: Jennifer [name] VHQ:[id number]   PCP: Nathaneil Canary, PA-C DOB: Feb 20, 1991   DOS: 01/18/2023     This patient has been accepted by my partner Dr. Antionette Char last night for presentation with chest pain. Plan currently is to monitor serial troponins. Patient's RN reached out to me requesting for a sliding scale given her hyperglycemia with CBG of 276. Patient has significant lab abnormality including hyponatremia, metabolic acidosis with high anion gap. She also has severe hyperkalemia as well. Reportedly she does have history of lipemia which makes the anion gap and sodium labs abnormal. I have recommended ED RN to discuss with onsite ED provider to evaluate the patient ASAP. Per ED RN, onsite provider has mentioned that the patient does not have DKA. Onsite ED provider request need to manage her hyperglycemia. Sliding scale ordered for now. Further management for her lipemia, hyponatremia, hyperglycemia, AKI at [facility] would be based on onsite ED provider as we do not have capability to evaluate the patient for acute change in status.  Author: Lynden Oxford, MD [facility] 01/18/2023

## 2023-01-18 NOTE — ED Notes (Signed)
Admitting MD P.Patel contacted regarding need for SSI. Admitting Md assessed labs and wants further labs to assess for DKA

## 2023-01-18 NOTE — ED Notes (Signed)
Pt unable to void at this time for ua.

## 2023-01-18 NOTE — ED Notes (Signed)
Pt requests benadryl for itching . She states she thinks its from the dilaudid.MD messaged

## 2023-01-18 NOTE — ED Notes (Signed)
MD Jodi Mourning informed of admitting MD recommendations

## 2023-01-18 NOTE — ED Notes (Signed)
Called [facility] to transport patient to [id number] Rm# [phone]

## 2023-01-18 NOTE — H&P (Addendum)
TRH H&P   Patient Demographics:    Jennifer [name], is a 32 y.o. female  MRN: [id number]   DOB - 12-20-1990  Admit Date - 01/17/2023  Outpatient Primary MD for the patient is Nathaneil Canary, PA-C  Outpatient Specialists: DR Rennis Golden    Patient coming from: [facility]  Chief Complaint  Patient presents with   Chest Pain      HPI:    Jennifer [name]  is a 32 y.o. 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  Cardiology was consulted and she was requested to be transferred to [facility] for further admission.  She has come to the [facility] a day after presenting to [facility] ER, she is currently relatively symptom-free, her chest pain has resolved, no headache, no fever or chills, no shortness of breath or palpitations, no abdominal pain, no diarrhea or dysuria.  [name] hungry and wants to eat.  Currently review of systems negative.    Review of systems:    A full 10 point Review of Systems was done, except as stated above, all other Review of  Systems were negative.   With Past History of the following :    Past Medical History:  Diagnosis Date   Afib (HCC) 05/12/2021   Brain tumor (HCC) 03/29/1995   astrocytoma   CHF (congestive heart failure) (HCC)    Cholesterosis    CKD (chronic kidney disease) stage 4, GFR 15-29 ml/min (HCC) 05/13/2021   DM (diabetes mellitus) (HCC) 10/10/2018   Fatty liver    HTN (hypertension) 10/10/2018   Hypothyroidism 10/10/2018   Lipoprotein deficiency    Lung disease    longevity long term   Pancreatitis    Polycystic ovary syndrome       Past  Surgical History:  Procedure Laterality Date   ABDOMINAL SURGERY     pt states during miscarriage got her intestine   BRAIN SURGERY     EYE MUSCLE SURGERY Right 03/28/2014   PRESSURE SENSOR/CARDIOMEMS N/A 12/06/2021   Procedure: PRESSURE SENSOR/CARDIOMEMS;  Surgeon: Dolores Patty, MD;  Location: [facility] INVASIVE CV LAB;  Service: Cardiovascular;  Laterality: N/A;   RIGHT HEART CATH N/A 08/05/2021   Procedure: RIGHT HEART CATH;  Surgeon: Dolores Patty, MD;  Location: [facility] INVASIVE CV LAB;  Service: Cardiovascular;  Laterality: N/A;   RIGHT HEART CATH N/A 12/06/2021   Procedure: RIGHT HEART CATH;  Surgeon: Dolores Patty, MD;  Location: [facility] INVASIVE CV LAB;  Service: Cardiovascular;  Laterality: N/A;   RIGHT/LEFT HEART CATH AND CORONARY ANGIOGRAPHY N/A 12/03/2020   Procedure: RIGHT/LEFT HEART CATH AND CORONARY ANGIOGRAPHY;  Surgeon: Yates Decamp, MD;  Location: [facility] INVASIVE CV LAB;  Service: Cardiovascular;  Laterality: N/A;   VENTRICULOSTOMY  03/28/1997      Social History:     Social History   Tobacco Use   Smoking status: Never   Smokeless tobacco: Never  Substance Use Topics   Alcohol use: Never        Family History :     Family History  Problem Relation Age of Onset   Diabetes Mother    Hypertension Mother    Hyperlipidemia Mother    Thyroid disease Mother    Hypertension Father    Diabetes Father    Pancreatic  cancer Paternal Aunt    Pancreatic cancer Paternal Uncle    Colon cancer Neg Hx    Esophageal cancer Neg Hx    Stomach cancer Neg Hx        Home Medications:   Prior to Admission medications   Medication Sig Start Date End Date Taking? Authorizing Provider  amitriptyline (ELAVIL) 10 MG tablet Take 10 mg by mouth at bedtime.   Yes [provider]  aspirin EC 81 MG EC tablet Take 1 tablet (81 mg total) by mouth daily. Swallow whole. 12/07/20  Yes Pennie Banter, DO  butalbital-acetaminophen-caffeine (FIORICET) 50-325-40 MG tablet Take 1 tablet by mouth daily as needed for headache. 09/26/22  Yes [provider]  CREON 36000-114000 units CPEP capsule Take 36,000 Units by mouth 3 (three) times daily.   Yes [provider]  dicyclomine (BENTYL) 20 MG tablet Take 1 tablet (20 mg total) by mouth 2 (two) times daily. Patient taking differently: Take 20 mg by mouth as needed for spasms. 09/15/22  Yes [name], [name], MD  DULoxetine (CYMBALTA) 60 MG capsule Take 60 mg by mouth at bedtime. 09/26/22  Yes [provider]  ergocalciferol (VITAMIN D2) 1.25 MG (50000 UT) capsule Take 50,000 Units by mouth every 7 (seven) days. Every Sunday   Yes [provider]  Evolocumab (REPATHA) 140 MG/ML SOSY Inject 140 mg into the skin See admin instructions. Inject 140 mg subcutaneously every other Thursday evening   Yes [provider]  fenofibrate 54 MG tablet Take 1 tablet (54 mg total) by mouth daily. 12/01/22  Yes [name], [name], MD  hydrALAZINE (APRESOLINE) 50 MG tablet Take 50 mg by mouth 3 (three) times daily. 11/16/22  Yes [provider]  insulin lispro (HUMALOG) 100 UNIT/ML injection Inject 20 Units into the skin 3 (three) times daily before meals.   Yes [provider]  Insulin Pen Needle 32G X 4 MM MISC 1 Device by Does not apply route QID. For use with  insulin pens 02/13/22  Yes [name], MD  insulin regular human CONCENTRATED  (HUMULIN R U-500 KWIKPEN) 500 UNIT/ML KwikPen Inject 185 Units into the skin 3 (three) times daily with meals. Patient taking differently: Inject 195 Units into the skin 3 (three) times daily with meals. 09/20/22 01/09/24 Yes [name], MD  levocetirizine (XYZAL) 5 MG tablet Take 5 mg by mouth daily. 03/05/22  Yes [provider]  levothyroxine (SYNTHROID) 137 MCG tablet Take 137 mcg by mouth daily. 09/20/22  Yes [provider]  pantoprazole (PROTONIX) 40 MG tablet Take 40 mg by mouth daily. 12/24/19  Yes [provider]  pregabalin (LYRICA) 300 MG capsule Take 300 mg by mouth at bedtime.   Yes [provider]  prochlorperazine (COMPAZINE) 5 MG tablet Take 5 mg by mouth at bedtime.   Yes [provider]  rosuvastatin (CRESTOR) 40 MG tablet Take 40 mg by mouth daily. 08/09/21  Yes [provider]  sodium bicarbonate 650 MG tablet Take 650 mg by mouth 2 (two) times daily.   Yes [provider]  topiramate (TOPAMAX) 50 MG tablet Take 50 mg by mouth 2 (two) times daily.   Yes [provider]  torsemide (DEMADEX) 20 MG tablet Take 2 tablets (40 mg total) by mouth daily. May take 1 additional tablet as needed 10/05/22  Yes [name], [name], MD  tretinoin (RETIN-A) 0.05 % cream Apply 1 application topically at bedtime. 01/17/21  Yes [provider]  carvedilol (COREG) 3.125 MG tablet Take 1 tablet (3.125 mg total) by mouth 2 (two) times daily. Patient not taking: Reported on 01/12/2023 10/04/22   [name], [name], MD  ivabradine (CORLANOR) 7.5 MG TABS tablet Take 1 tablet (7.5 mg total) by mouth 2 (two) times daily with a meal. Patient not taking: Reported on 01/09/2023 08/11/22   [name], MD  VASCEPA 1 g capsule Take 2 capsules (2 g total) by mouth 2 (two) times daily. 12/01/22   [name], [name], MD     Allergies:     Allergies  Allergen Reactions   Ketamine Other (See Comments)    In vegetative state  for 15 minutes per pt   [place] Itching    Itchy throat  Itchy throat Itchy throat Itchy throat Itchy throat    Itchy throat  Itchy throat Itchy throat Itchy throat Itchy throat    Itchy throat   Morphine Itching, Rash and Hives    Sorethroat   Penicillins Hives, Itching and Rash    Has patient had a PCN reaction causing immediate rash, facial/tongue/throat swelling, SOB or lightheadedness with hypotension: Y Has patient had a PCN reaction causing severe rash involving mucus membranes or skin necrosis: Y Has patient had a PCN reaction that required hospitalization: N Has patient had a PCN reaction occurring within the last 10 years: Y   Clarithromycin Other (See Comments)    Stomach pain   Erythromycin     Stomach pain   Fentanyl Nausea And Vomiting   Penicillin V Hives, Itching and Other (See Comments)    Gi upset, also   Shellfish Allergy Other (See Comments)    Pt has never had shellfish but tested positive on allergy test. Pt states contrast in CT is okay   Prednisone Rash     Physical Exam:   Vitals  Blood pressure (!) 159/102, pulse (!) 107, temperature 98.3 F (36.8 C), temperature source Oral, resp. rate 20, SpO2 97%.   1. General Young Caucasian  female lying in hospital bed in no distress, right-sided ptosis, mild left upper extremity weakness  2. Normal affect and insight, Not Suicidal or Homicidal, Awake Alert,   3. No F.N deficits, ALL C.Nerves Intact, mild chronic left upper extremity weakness, strength 5/5 all  extremities, Sensation intact all 4 extremities, Plantars down going.  4. Ears and Eyes appear Normal, Conjunctivae clear, PERRLA. Moist Oral Mucosa.  5. Supple Neck, No JVD, No cervical lymphadenopathy appriciated, No Carotid Bruits.  6. Symmetrical Chest wall movement, Good air movement bilaterally, CTAB.  7. RRR, No Gallops, Rubs or Murmurs, No Parasternal Heave.  8. Positive Bowel Sounds, Abdomen Soft, No tenderness, No organomegaly  appriciated,No rebound -guarding or rigidity.  9.  No Cyanosis, Normal Skin Turgor, No Skin Rash or Bruise.  10. Good muscle tone,  joints appear normal , no effusions, Normal ROM.  11. No Palpable Lymph Nodes in Neck or Axillae      Data Review:   Recent Labs  Lab 01/12/23 2130 01/17/23 2001 01/18/23 0918  WBC 7.1 7.7  --   HGB 10.2* 12.1 12.2  HCT 31.7* 33.4* 36.0  PLT 211 220  --   MCV 94.1 89.3  --   MCH 30.3 32.4  --   MCHC 32.2 36.2*  --   RDW 17.1* 16.7*  --     Recent Labs  Lab 01/12/23 2130 01/13/23 0053 01/17/23 2001 01/17/23 2300 01/18/23 0857 01/18/23 0918  NA 127*  --  127*  --  119* 127*  K 6.0* 5.0 5.5*  --  5.3* 5.8*  CL 99  --  94*  --  87*  --   CO2 18*  --  13*  --  13*  --   ANIONGAP 10  --  20*  --  19*  --   GLUCOSE 243*  --  315*  --  351*  --   BUN 36*  --  40*  --  48*  --   CREATININE 1.53*  --  1.04*  --  2.11*  --   AST  --  157* UNABLE TO DETERMINE DUE TO LIPEMIA  --   --   --   ALT  --  114* UNABLE TO DETERMINE DUE TO LIPEMIA  --   --   --   ALKPHOS  --  161* 210*  --   --   --   BILITOT  --  1.5* 1.3*  --   --   --   ALBUMIN  --  4.1 3.9  --   --   --   CRP  --   --   --  1.2*  --   --   CALCIUM 8.8*  --  9.1  --  9.7  --     Lab Results  Component Value Date   CHOL 957 (H) 01/18/2023   HDL NOT REPORTED DUE TO HIGH TRIGLYCERIDES 01/18/2023   LDLCALC UNABLE TO CALCULATE IF TRIGLYCERIDE OVER 400 mg/dL 16/12/9602   LDLDIRECT UNABLE TO CALCULATE IF TRIGLYCERIDE IS >1293 mg/dL 54/11/8117   TRIG >1,478 (H) 01/18/2023   CHOLHDL NOT REPORTED DUE TO HIGH TRIGLYCERIDES 01/18/2023    Recent Labs  Lab 01/12/23 2130 01/17/23 2001 01/17/23 2300 01/18/23 0857  CRP  --   --  1.2*  --   CALCIUM 8.8* 9.1  --  9.7    Recent Labs  Lab 01/12/23 2130 01/17/23 2001 01/17/23 2300 01/18/23 0857  WBC 7.1 7.7  --   --  PLT 211 220  --   --   CRP  --   --  1.2*  --   CREATININE 1.53* 1.04*  --  2.11*    Urinalysis    Component  Value Date/Time   COLORURINE YELLOW 01/18/2023 0859   APPEARANCEUR HAZY (A) 01/18/2023 0859   LABSPEC 1.012 01/18/2023 0859   PHURINE 5.5 01/18/2023 0859   GLUCOSEU 500 (A) 01/18/2023 0859   HGBUR LARGE (A) 01/18/2023 0859   BILIRUBINUR NEGATIVE 01/18/2023 0859   KETONESUR NEGATIVE 01/18/2023 0859   PROTEINUR 100 (A) 01/18/2023 0859   NITRITE NEGATIVE 01/18/2023 0859   LEUKOCYTESUR NEGATIVE 01/18/2023 0859      Imaging Results:    DG Chest 2 View  Result Date: 01/17/2023 CLINICAL DATA:  Chest pain, fevers, sweating and clamminess. EXAM: CHEST - 2 VIEW COMPARISON:  01/12/2023 FINDINGS: Surgical clip projected over the heart. Mild cardiac enlargement. No vascular congestion, edema, or consolidation. No pleural effusions. No pneumothorax. Mediastinal contours appear intact. IMPRESSION: Mild cardiac enlargement.  Lungs are clear. Electronically Signed   By: Burman Nieves M.D.   On: 01/17/2023 22:15    My personal review of EKG: Rhythm NSR,  no Acute ST changes   Assessment & Plan:    1.  Atypical chest pain arising 2 days ago lasting off-and-on for a day now completely resolved for the last 10 hours.  Stable EKG, currently chest pain-free, troponin trend flat and in non-ACS pattern, continue aspirin, statin and beta-blocker, cardiology to evaluate.  Monitor on telemetry.  2.  Severe hypertriglyceridemia & history of mixed dyslipidemia in the past.  Severe high triglycerides causing hyponatremia and various electrolyte abnormalities, gentle IV fluids with normal saline, insulin drip for hypertriglyceridemia, discussed with pharmacy.  Continue home statin and fibrate and monitor.  Ideally we would have kept her n.p.o. but she wants to eat as she is severely hungry, initiate diet and monitor.  Charge continue Repatha.  3.  History of chronic systolic heart failure last EF 40 to 45%.  Currently appears dehydrated, gentle normal saline and monitor.  Hold diuretics, Coreg continued, no  ACE/ARB or Entresto due to AKI.  Defer Ivabradine to cardiology.  4.  Dehydration, pseudohyponatremia versus true hyponatremia due to severe hypertriglyceridemia, AKI on CKD 3B, hyperkalemia, metabolic acidosis.  Hydrate with IV fluids, insulin drip, Lokelma, oral sodium bicarbonate 3 times daily, repeat stat labs, urine electrolytes now and in the morning and monitor.  Currently beta hydroxybutyrate is negative but she could be in early DKA, insulin drip will suffice.  5.  Poorly controlled hypertension.  Placed on Coreg, oral home dose hydralazine and as needed hydralazine.  6.  Remote history of astrocytoma, resection in the past, residual left arm weakness and ptosis.  Supportive care.  7.  History of chronic pancreatitis.  No acute issue continue Creon.  8.  Hypothyroidism.  Continue home dose Synthroid.  9.  GERD.  PPI.  10.  DM type II.  Possible early DKA although beta hydroxybutyrate is negative, kindly see #4 above.  Repeat stat labs, for now IV insulin drip and monitor.   DVT Prophylaxis Heparin   AM Labs Ordered, also please review Full Orders  Family Communication: Admission, patients condition and plan of care including tests being ordered have been discussed with the patient who indicates understanding and agree with the plan and Code Status.  Code Status Full  Likely DC to  TBD  Condition GUARDED    Consults called: Cards    Admission  status: Inpt    Time spent in minutes : 45  Signature  -    Susa Raring M.D on 01/18/2023 at 4:19 PM   -  To page go to [email or web address]

## 2023-01-19 DIAGNOSIS — E039 Hypothyroidism, unspecified: Secondary | ICD-10-CM | POA: Diagnosis not present

## 2023-01-19 DIAGNOSIS — K760 Fatty (change of) liver, not elsewhere classified: Secondary | ICD-10-CM | POA: Diagnosis present

## 2023-01-19 DIAGNOSIS — E872 Acidosis, unspecified: Secondary | ICD-10-CM | POA: Diagnosis not present

## 2023-01-19 DIAGNOSIS — R0789 Other chest pain: Secondary | ICD-10-CM | POA: Diagnosis not present

## 2023-01-19 DIAGNOSIS — E1122 Type 2 diabetes mellitus with diabetic chronic kidney disease: Secondary | ICD-10-CM | POA: Diagnosis not present

## 2023-01-19 DIAGNOSIS — R079 Chest pain, unspecified: Secondary | ICD-10-CM | POA: Diagnosis not present

## 2023-01-19 DIAGNOSIS — E11649 Type 2 diabetes mellitus with hypoglycemia without coma: Secondary | ICD-10-CM | POA: Diagnosis not present

## 2023-01-19 DIAGNOSIS — K219 Gastro-esophageal reflux disease without esophagitis: Secondary | ICD-10-CM | POA: Diagnosis not present

## 2023-01-19 DIAGNOSIS — N179 Acute kidney failure, unspecified: Secondary | ICD-10-CM | POA: Diagnosis present

## 2023-01-19 DIAGNOSIS — I4891 Unspecified atrial fibrillation: Secondary | ICD-10-CM | POA: Diagnosis not present

## 2023-01-19 DIAGNOSIS — I951 Orthostatic hypotension: Secondary | ICD-10-CM | POA: Diagnosis not present

## 2023-01-19 DIAGNOSIS — N184 Chronic kidney disease, stage 4 (severe): Secondary | ICD-10-CM | POA: Diagnosis not present

## 2023-01-19 DIAGNOSIS — E1165 Type 2 diabetes mellitus with hyperglycemia: Secondary | ICD-10-CM | POA: Diagnosis present

## 2023-01-19 DIAGNOSIS — I5022 Chronic systolic (congestive) heart failure: Secondary | ICD-10-CM | POA: Diagnosis present

## 2023-01-19 DIAGNOSIS — E782 Mixed hyperlipidemia: Secondary | ICD-10-CM | POA: Diagnosis present

## 2023-01-19 DIAGNOSIS — I130 Hypertensive heart and chronic kidney disease with heart failure and stage 1 through stage 4 chronic kidney disease, or unspecified chronic kidney disease: Secondary | ICD-10-CM | POA: Diagnosis not present

## 2023-01-19 DIAGNOSIS — I3139 Other pericardial effusion (noninflammatory): Secondary | ICD-10-CM | POA: Diagnosis not present

## 2023-01-19 DIAGNOSIS — Z794 Long term (current) use of insulin: Secondary | ICD-10-CM | POA: Diagnosis not present

## 2023-01-19 DIAGNOSIS — K861 Other chronic pancreatitis: Secondary | ICD-10-CM | POA: Diagnosis present

## 2023-01-19 DIAGNOSIS — I428 Other cardiomyopathies: Secondary | ICD-10-CM | POA: Diagnosis present

## 2023-01-19 DIAGNOSIS — I2510 Atherosclerotic heart disease of native coronary artery without angina pectoris: Secondary | ICD-10-CM | POA: Diagnosis not present

## 2023-01-19 DIAGNOSIS — E871 Hypo-osmolality and hyponatremia: Secondary | ICD-10-CM | POA: Diagnosis not present

## 2023-01-19 DIAGNOSIS — I21A1 Myocardial infarction type 2: Secondary | ICD-10-CM

## 2023-01-19 DIAGNOSIS — E860 Dehydration: Secondary | ICD-10-CM | POA: Diagnosis not present

## 2023-01-19 DIAGNOSIS — I272 Pulmonary hypertension, unspecified: Secondary | ICD-10-CM | POA: Diagnosis not present

## 2023-01-19 DIAGNOSIS — E781 Pure hyperglyceridemia: Secondary | ICD-10-CM | POA: Diagnosis not present

## 2023-01-19 DIAGNOSIS — I081 Rheumatic disorders of both mitral and tricuspid valves: Secondary | ICD-10-CM | POA: Diagnosis present

## 2023-01-19 DIAGNOSIS — I10 Essential (primary) hypertension: Secondary | ICD-10-CM | POA: Diagnosis not present

## 2023-01-19 DIAGNOSIS — I160 Hypertensive urgency: Secondary | ICD-10-CM | POA: Diagnosis not present

## 2023-01-19 DIAGNOSIS — E875 Hyperkalemia: Secondary | ICD-10-CM | POA: Diagnosis not present

## 2023-01-19 HISTORY — DX: Myocardial infarction type 2: I21.A1

## 2023-01-19 LAB — CBC WITH DIFFERENTIAL/PLATELET
Abs Immature Granulocytes: 0.03 10*3/uL (ref 0.00–0.07)
Basophils Absolute: 0 10*3/uL (ref 0.0–0.1)
Basophils Relative: 1 %
Eosinophils Absolute: 0.2 10*3/uL (ref 0.0–0.5)
Eosinophils Relative: 3 %
HCT: 26.8 % — ABNORMAL LOW (ref 36.0–46.0)
Hemoglobin: 9 g/dL — ABNORMAL LOW (ref 12.0–15.0)
Immature Granulocytes: 1 %
Lymphocytes Relative: 46 %
Lymphs Abs: 3 10*3/uL (ref 0.7–4.0)
MCH: 30.6 pg (ref 26.0–34.0)
MCHC: 33.6 g/dL (ref 30.0–36.0)
MCV: 91.2 fL (ref 80.0–100.0)
Monocytes Absolute: 0.5 10*3/uL (ref 0.1–1.0)
Monocytes Relative: 8 %
Neutro Abs: 2.5 10*3/uL (ref 1.7–7.7)
Neutrophils Relative %: 41 %
Platelets: 163 10*3/uL (ref 150–400)
RBC: 2.94 MIL/uL — ABNORMAL LOW (ref 3.87–5.11)
RDW: 16.6 % — ABNORMAL HIGH (ref 11.5–15.5)
WBC: 6.2 10*3/uL (ref 4.0–10.5)
nRBC: 0.6 % — ABNORMAL HIGH (ref 0.0–0.2)

## 2023-01-19 LAB — I-STAT VENOUS BLOOD GAS, ED
Acid-base deficit: 5 mmol/L — ABNORMAL HIGH (ref 0.0–2.0)
Bicarbonate: 21 mmol/L (ref 20.0–28.0)
Calcium, Ion: 1.22 mmol/L (ref 1.15–1.40)
HCT: 35 % — ABNORMAL LOW (ref 36.0–46.0)
Hemoglobin: 11.9 g/dL — ABNORMAL LOW (ref 12.0–15.0)
O2 Saturation: 90 %
Potassium: 5.3 mmol/L — ABNORMAL HIGH (ref 3.5–5.1)
Sodium: 128 mmol/L — ABNORMAL LOW (ref 135–145)
TCO2: 22 mmol/L (ref 22–32)
pCO2, Ven: 40 mm[Hg] — ABNORMAL LOW (ref 44–60)
pH, Ven: 7.327 (ref 7.25–7.43)
pO2, Ven: 62 mm[Hg] — ABNORMAL HIGH (ref 32–45)

## 2023-01-19 LAB — GLUCOSE, CAPILLARY
Glucose-Capillary: 110 mg/dL — ABNORMAL HIGH (ref 70–99)
Glucose-Capillary: 114 mg/dL — ABNORMAL HIGH (ref 70–99)
Glucose-Capillary: 143 mg/dL — ABNORMAL HIGH (ref 70–99)
Glucose-Capillary: 143 mg/dL — ABNORMAL HIGH (ref 70–99)
Glucose-Capillary: 159 mg/dL — ABNORMAL HIGH (ref 70–99)
Glucose-Capillary: 159 mg/dL — ABNORMAL HIGH (ref 70–99)
Glucose-Capillary: 170 mg/dL — ABNORMAL HIGH (ref 70–99)
Glucose-Capillary: 170 mg/dL — ABNORMAL HIGH (ref 70–99)
Glucose-Capillary: 176 mg/dL — ABNORMAL HIGH (ref 70–99)
Glucose-Capillary: 187 mg/dL — ABNORMAL HIGH (ref 70–99)
Glucose-Capillary: 188 mg/dL — ABNORMAL HIGH (ref 70–99)
Glucose-Capillary: 195 mg/dL — ABNORMAL HIGH (ref 70–99)
Glucose-Capillary: 205 mg/dL — ABNORMAL HIGH (ref 70–99)
Glucose-Capillary: 52 mg/dL — ABNORMAL LOW (ref 70–99)
Glucose-Capillary: 68 mg/dL — ABNORMAL LOW (ref 70–99)
Glucose-Capillary: 92 mg/dL (ref 70–99)

## 2023-01-19 LAB — COMPREHENSIVE METABOLIC PANEL
ALT: 130 U/L — ABNORMAL HIGH (ref 0–44)
AST: 113 U/L — ABNORMAL HIGH (ref 15–41)
Albumin: 3.4 g/dL — ABNORMAL LOW (ref 3.5–5.0)
Albumin: 3.7 g/dL (ref 3.5–5.0)
Alkaline Phosphatase: 152 U/L — ABNORMAL HIGH (ref 38–126)
Alkaline Phosphatase: 153 U/L — ABNORMAL HIGH (ref 38–126)
Anion gap: 13 (ref 5–15)
Anion gap: 13 (ref 5–15)
BUN: 45 mg/dL — ABNORMAL HIGH (ref 6–20)
BUN: 41 mg/dL — ABNORMAL HIGH (ref 6–20)
CO2: 20 mmol/L — ABNORMAL LOW (ref 22–32)
CO2: 19 mmol/L — ABNORMAL LOW (ref 22–32)
Calcium: 8.4 mg/dL — ABNORMAL LOW (ref 8.9–10.3)
Calcium: 8.6 mg/dL — ABNORMAL LOW (ref 8.9–10.3)
Chloride: 92 mmol/L — ABNORMAL LOW (ref 98–111)
Chloride: 101 mmol/L (ref 98–111)
Creatinine, Ser: 1.43 mg/dL — ABNORMAL HIGH (ref 0.44–1.00)
Creatinine, Ser: 1.8 mg/dL — ABNORMAL HIGH (ref 0.44–1.00)
GFR, Estimated: 50 mL/min — ABNORMAL LOW (ref >60)
GFR, Estimated: 38 mL/min — ABNORMAL LOW (ref >60)
Glucose, Bld: 162 mg/dL — ABNORMAL HIGH (ref 70–99)
Glucose, Bld: 210 mg/dL — ABNORMAL HIGH (ref 70–99)
Potassium: 5.6 mmol/L — ABNORMAL HIGH (ref 3.5–5.1)
Potassium: 7.4 mmol/L (ref 3.5–5.1)
Sodium: 125 mmol/L — ABNORMAL LOW (ref 135–145)
Sodium: 133 mmol/L — ABNORMAL LOW (ref 135–145)
Total Bilirubin: 1.9 mg/dL — ABNORMAL HIGH (ref 0.3–1.2)
Total Bilirubin: 3.8 mg/dL — ABNORMAL HIGH (ref 0.3–1.2)
Total Protein: 6.7 g/dL (ref 6.5–8.1)

## 2023-01-19 LAB — SODIUM, URINE, RANDOM: Sodium, Ur: 51 mmol/L

## 2023-01-19 LAB — MAGNESIUM: Magnesium: 3 mg/dL — ABNORMAL HIGH (ref 1.7–2.4)

## 2023-01-19 LAB — BASIC METABOLIC PANEL
Anion gap: 14 (ref 5–15)
BUN: 42 mg/dL — ABNORMAL HIGH (ref 6–20)
CO2: 17 mmol/L — ABNORMAL LOW (ref 22–32)
Calcium: 8.7 mg/dL — ABNORMAL LOW (ref 8.9–10.3)
Chloride: 104 mmol/L (ref 98–111)
Creatinine, Ser: 1.75 mg/dL — ABNORMAL HIGH (ref 0.44–1.00)
GFR, Estimated: 39 mL/min — ABNORMAL LOW (ref >60)
Glucose, Bld: 54 mg/dL — ABNORMAL LOW (ref 70–99)
Potassium: >7.5 mmol/L (ref 3.5–5.1)
Sodium: 135 mmol/L (ref 135–145)

## 2023-01-19 LAB — OSMOLALITY, URINE: Osmolality, Ur: 273 mosm/kg — ABNORMAL LOW (ref 300–900)

## 2023-01-19 LAB — BRAIN NATRIURETIC PEPTIDE: B Natriuretic Peptide: 35.8 pg/mL (ref 0.0–100.0)

## 2023-01-19 LAB — PHOSPHORUS: Phosphorus: 4.5 mg/dL (ref 2.5–4.6)

## 2023-01-19 LAB — TRIGLYCERIDES: Triglycerides: >5000 mg/dL — ABNORMAL HIGH (ref <150)

## 2023-01-19 LAB — BETA-HYDROXYBUTYRIC ACID: Beta-Hydroxybutyric Acid: 0.11 mmol/L (ref 0.05–0.27)

## 2023-01-19 LAB — POTASSIUM: Potassium: 5.6 mmol/L — ABNORMAL HIGH (ref 3.5–5.1)

## 2023-01-19 MED ORDER — SODIUM CHLORIDE 0.45 % IV SOLN: INTRAVENOUS | Status: DC

## 2023-01-19 MED ORDER — INSULIN REGULAR HUMAN (CONC) 500 UNIT/ML ~~LOC~~ SOPN
100.0000 [IU] | PEN_INJECTOR | Freq: Three times a day (TID) | SUBCUTANEOUS | Status: DC
  Administered 2023-01-19 – 2023-01-20 (×3): 100 [IU] via SUBCUTANEOUS

## 2023-01-19 MED ORDER — INSULIN ASPART 100 UNIT/ML IJ SOLN
16.0000 [IU] | Freq: Three times a day (TID) | INTRAMUSCULAR | Status: DC
  Administered 2023-01-19 – 2023-01-20 (×4): 16 [IU] via SUBCUTANEOUS

## 2023-01-19 MED ORDER — SODIUM ZIRCONIUM CYCLOSILICATE 10 G PO PACK
10.0000 g | PACK | Freq: Once | ORAL | Status: DC
  Administered 2023-01-19: 10 g via ORAL
  Filled 2023-01-19: qty 1

## 2023-01-19 MED ORDER — INSULIN REGULAR HUMAN (CONC) 500 UNIT/ML ~~LOC~~ SOPN
140.0000 [IU] | PEN_INJECTOR | Freq: Three times a day (TID) | SUBCUTANEOUS | Status: DC
  Administered 2023-01-19: 140 [IU] via SUBCUTANEOUS
  Filled 2023-01-19: qty 3

## 2023-01-19 NOTE — Plan of Care (Signed)

## 2023-01-19 NOTE — Progress Notes (Signed)
   01/18/23 2230  Assess: MEWS Score  Temp 98.3 F (36.8 C)  BP (!) 148/96  MAP (mmHg) 113  Pulse Rate (!) 121  ECG Heart Rate (!) 121  Resp 20  Level of Consciousness Alert  SpO2 94 %  O2 Device Room Air  Assess: MEWS Score  MEWS Temp 0  MEWS Systolic 0  MEWS Pulse 2  MEWS RR 0  MEWS LOC 0  MEWS Score 2  MEWS Score Color Yellow  Assess: if the MEWS score is Yellow or Red  Were vital signs accurate and taken at a resting state? Yes  Does the patient meet 2 or more of the SIRS criteria? Yes  Does the patient have a confirmed or suspected source of infection? No  MEWS guidelines implemented  Yes, yellow  Treat  MEWS Interventions Considered administering scheduled or prn medications/treatments as ordered  Take Vital Signs  Increase Vital Sign Frequency  Yellow: Q2hr x1, continue Q4hrs until patient remains green for 12hrs  Escalate  MEWS: Escalate Yellow: Discuss with charge nurse and consider notifying provider and/or RRT  Notify: Charge Nurse/RN  Name of Charge Nurse/RN Notified [name] RN  Provider Notification  Provider Name/Title Dr Wonda Amis  Date Provider Notified 01/18/23  Time Provider Notified 2230  Method of Notification Page (secure chat)  Notification Reason Change in status  Provider response No new orders;Evaluate remotely  Date of Provider Response 01/18/23  Time of Provider Response 2230  Assess: SIRS CRITERIA  SIRS Temperature  0  SIRS Pulse 1  SIRS Respirations  0  SIRS WBC 0  SIRS Score Sum  1

## 2023-01-19 NOTE — Progress Notes (Signed)
Patient Name: Jennifer [name] Date of Encounter: 01/19/2023 [facility] Cardiologist: Yates Decamp, MD   Interval Summary  .    Feeling much better today. No chest pain.   Vital Signs .    Vitals:   01/18/23 2230 01/19/23 0004 01/19/23 0316 01/19/23 0758  BP: (!) 148/96 122/69 (!) 139/91 135/88  Pulse: (!) 121 (!) 114 (!) 113 (!) 108  Resp: 20 20 19 20   Temp: 98.3 F (36.8 C) 98.2 F (36.8 C) 98 F (36.7 C) 98.1 F (36.7 C)  TempSrc: Oral Oral Oral Oral  SpO2: 94% 95% 95% 99%  Weight:   61.5 kg     Intake/Output Summary (Last 24 hours) at 01/19/2023 0857 Last data filed at 01/19/2023 0830 Gross per 24 hour  Intake 2059.49 ml  Output 0 ml  Net 2059.49 ml      01/19/2023    3:16 AM 01/12/2023    6:16 PM 01/08/2023   11:35 PM  Last 3 Weights  Weight (lbs) 135 lb 9.3 oz 130 lb 130 lb  Weight (kg) 61.5 kg 58.968 kg 58.968 kg      Telemetry/ECG    Sinus Tachycardia - Personally Reviewed  Physical Exam .   GEN: No acute distress.   Neck: No JVD Cardiac: RRR, no murmurs, rubs, or gallops.  Respiratory: Clear to auscultation bilaterally. GI: Soft, nontender, non-distended  MS: No edema  Assessment & Plan .     32 y.o. 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   Chest Pain  Elevated Troponin  CAD -- Patient reports having chronic chest pain on exertion for "her whole life". Underwent cath in 2022 that showed single vessel CAD with 99% occlusion in a small RCA. Overall, felt that this single vessel disease did not explain her reduced EF. Chest pain on exertion has not changed since that time  -- Had an episode of chest pain at rest in setting of elevated BPs of 190s/80s  -- hsTroponin  76>>81 -- symptoms sound atypical in nature, no further episodes now that BP improved. -- Continue ASA, carvedilol, crestor   NICM with recovered EF -- Previously had EF 25-30% in 2022. Most recent echocardiogram from 10/04/22 showed EF 50-55%, no regional wall motion abnormalities, normal RV function, normal pulmonary artery systolic pressure, mild MR -- on gentle IV fluids for her dehydration. Follow volume status closely -- GDMT: Continue carvedilol 3.125 mg BID, hydralazine 50 mg TID  -- Home Torsemide held due to dehydration, AKI -- unable to add spiro vs ARB/ARNI with AKI/hyperkalemia   HTN  HTN Urgency  -- Patient reports having BP elevated to the 190s/80s at home. Reports her PCP recently took her off some of her BP medication  -- BP significantly elevated on admission with systolic greater than 200 -- BP now improved, continue on carvedilol, hydralazine  Pulmonary hypertension -- Mild to moderate on right heart cath 07/2021 -- Last office note from [facility] notes to continue sildenafil 20 mg 3 times daily. Looks like this was stopped 09/2022 in the setting of hypotension/dizziness   Otherwise per primary  Severe hypertriglyceridemia  Remote history of astrocytoma  Hyperkalemia Chronic pancreatitis  Hypothyroidism  GERD DM. Dehydration, pseudohyponatremia  For questions or updates, please contact [facility] Please  consult [email or web address] for contact info under        Signed, Laverda Page, NP

## 2023-01-19 NOTE — Inpatient Diabetes Management (Signed)
Inpatient Diabetes Program Recommendations  AACE/ADA: New Consensus Statement on Inpatient Glycemic Control  Target Ranges:  Prepandial:   less than 140 mg/dL      Peak postprandial:   less than 180 mg/dL (1-2 hours)      Critically ill patients:  140 - 180 mg/dL    Latest Reference Range & Units 01/19/23 00:02 01/19/23 01:07 01/19/23 02:17 01/19/23 04:26 01/19/23 06:26 01/19/23 08:29 01/19/23 09:35 01/19/23 10:30  Glucose-Capillary 70 - 99 mg/dL 469 (H) 629 (H) 528 (H) 143 (H) 170 (H) 188 (H) 205 (H) 187 (H)   Review of Glycemic Control  Diabetes history: DM2 Outpatient Diabetes medications: Humulin R U500 195 units TID with meals, Humalog 20 units TID with meals Current orders for Inpatient glycemic control: Humulin R U500 140 units TID with meals, Novolog 16 units TID with meals  Inpatient Diabetes Program Recommendations:    Insulin: May want to consider discontinuing Novolog 16 units TID and order Novolog 0-15 units AC&HS for correction to see how glucose trends with Humulin R U500 insulin along with correction insulin as inpatient.   NOTE: Patient presented to [facility] ED and transferred to [facility] on 01/18/23; admitted with chest pain, severe hypertriglyceridemia, dehydration, poorly controlled hypertension, and possible early DKA. Patient was started on IV insulin drip for hypertriglyceridemia and was ordered SQ insulin orders today.  Per chart review, patient sees Dr. Doneta Public (Endocrinology) and had a televisit on 12/19/22. Per televist note on 12/19/22 patient was told to decrease Humulin R U500 from 210 units TID to 195 units TID and added Humalog 20 units TID due to post prandial elevations. Patient was inpatient 01/02/23-01/04/23 and inpatient diabetes coordinator spoke to patient on 01/02/23 during that admission.   Thanks, Orlando Penner, RN, MSN, CDCES Diabetes Coordinator Inpatient Diabetes Program [phone] (Team Pager from 8am to 5pm)

## 2023-01-19 NOTE — Evaluation (Signed)
Physical Therapy Brief Evaluation and Discharge Note Patient Details Name: Jennifer [name] MRN: [id number] DOB: Nov 18, 1990 Today's Date: 01/19/2023   History of Present Illness  32 y.o. female admitted 10/22 with chest pain.  Pt with cardiomyopathy, HTN and pulmonary HTn. 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  Pt currently without significant functional limitations and is ambulating at baseline per pt. Will not need  acute skilled PT at this time. Will sign off.   PT Assessment Patient does not need any further PT services  Assistance Needed at Discharge  None    Equipment Recommendations None recommended by PT  Recommendations for Other Services       Precautions/Restrictions Precautions Precautions: Fall Restrictions Weight Bearing Restrictions: No        Mobility  Bed Mobility Rolling: Independent        Transfers Overall transfer level: Independent                      Ambulation/Gait Ambulation/Gait assistance: Independent Gait Distance (Feet): 225 Feet Assistive device: None Gait Pattern/deviations: Step-through pattern, Decreased stance time - right, Decreased dorsiflexion - left, Knee flexed in stance - left, Antalgic Gait Speed: Pace WFL General Gait Details: Pt with notable foot drop left LE.  Pt states she is at baseline. She states she had an AFO but she doesnt wear it as it has worn out.  Offered to get a consult for a new one and she declined.  Pt is safe with gait and accomodated well with no LOB without use of a device for ambulation as well.  Home Activity Instructions    Stairs Stairs:  (declined to practice)          Modified Rankin (Stroke Patients Only)        Balance Overall balance assessment: No apparent  balance deficits (not formally assessed)                        Pertinent Vitals/Pain PT - Brief Vital Signs All Vital Signs Stable: Yes (98% RA, 155/93 end of rx with nurse aware) Pain Assessment Pain Assessment: No/denies pain     Home Living Family/patient expects to be discharged to:: Private residence Living Arrangements: Alone   Home Environment: Stairs to enter  [employer] of Steps: 16 Home Equipment: None        Prior Function Level of Independence: Independent      UE/LE Assessment   UE ROM/Strength/Tone/Coordination: Impaired UE ROM/Strength/Tone/Coordination Deficits: left UE shoulder with slight movement but not functional for use  LE ROM/Strength/Tone/Coordination: Impaired LE ROM/Strength/Tone/Coordination Deficits: Pt with left foot drop noted.  hip and knee WFL for strength    Communication   Communication Communication: No apparent difficulties     Cognition Overall Cognitive Status: Appears within functional limits for tasks assessed/performed       General Comments General comments (skin integrity, edema, etc.): 98% RA, 155/93 at end of session    Exercises     Assessment/Plan    PT Problem List Decreased mobility       PT Visit Diagnosis Muscle weakness (generalized) (M62.81)    No Skilled PT Patient at baseline level of functioning;Patient is independent with all acitivity/mobility;All education completed   Co-evaluation  AMPAC 6 Clicks Help needed turning from your back to your side while in a flat bed without using bedrails?: None Help needed moving from lying on your back to sitting on the side of a flat bed without using bedrails?: None Help needed moving to and from a bed to a chair (including a wheelchair)?: None Help needed standing up from a chair using your arms (e.g., wheelchair or bedside chair)?: None Help needed to walk in hospital room?: None Help needed climbing 3-5 steps with a  railing? : None 6 Click Score: 24      End of Session Equipment Utilized During Treatment: Gait belt Activity Tolerance: Patient tolerated treatment well Patient left: in bed;with call bell/phone within reach Nurse Communication: Mobility status PT Visit Diagnosis: Muscle weakness (generalized) (M62.81)     Time: 4098-1191 PT Time Calculation (min) (ACUTE ONLY): 10 min  Charges:   PT Evaluation $PT Eval Low Complexity: 1 Low      Reya Aurich M,PT Acute Rehab Services [phone]   [name]  01/19/2023, 1:15 PM

## 2023-01-19 NOTE — Progress Notes (Addendum)
Pharmacy Home Medication Reconciliation Communication High Risk Medication: Humulin U-500 Insulin  Home dose of Humulin U-500 insulin was reordered for this patient. The dose was verified with the patient as listed below:  Type of syringe used at home:  [facility] Number or line on syringe to which patient draws up insulin: confirmed dose prior to admission is 195 units TID  Other comments pertinent to patient home dosing: Reduced dose from home dose while inpatient and will adjust as needed per MD  Thank you for allowing pharmacy to participate in this patient's care,  Sherron Monday, PharmD, BCCCP Clinical Pharmacist  Phone: [phone] 01/19/2023 9:37 AM  Please check AMION for all [facility] phone numbers After 10:00 PM, call Main Pharmacy [phone]

## 2023-01-19 NOTE — Progress Notes (Signed)
PROGRESS NOTE    Jennifer [name]  ZOX:[id number] DOB: 1990/09/02 DOA: 01/17/2023 PCP: Nathaneil Canary, PA-C   Brief Narrative: Jennifer [name] is a 32 y.o. female with a history of severe hypertriglyceridemia, mixed dyslipidemia, chronic pancreatitis, diabetes mellitus type 2, hypertension, hypothyroidism, CKD stage IIIb.  Patient presented secondary to chest pain which resolved prior to admission. Cardiology was consulted with recommendation for now ACS workup. Hospitalization complicated by AKI and hyperkalemia.   Assessment and Plan:  Atypical chest pain Resolved prior to admission. EKG non-ischemic. Troponin 76 > 81. Cardiology consulted with recommendation for no inpatient ischemic workup. Per cardiology, unlikely ACS. No changes to medications.   Severe hypertriglyceridemia No associated acute pancreatitis. Triglycerides >5000. Patient started on an insulin drip for management on admission. Insulin drip discontinued secondary to chronic hypertriglyceridemia without acute illness to treat. Continue home fenofibrate, Crestor and Vascepa.   Chronic systolic heart failure LVEF of 40-45%. Dehydrated on admission and provided with IV hydration. No evidence of fluid overload.   Pseudohyponatremia Secondary to severe hypertriglyceridemia.   AKI on CKD IIIb Baseline creatinine appears to be around 1.5. Conflicting diagnoses for CKD noted in the chart. Creatinine prior to admission of 1.04, but this appears to be aberrant. Creatinine up to a peak of 2.11 -IV fluids   Hyperkalemia Probably related to AKI. Patient with multiple blood samples consistent with hemolysis. Patient treated with Lokelma. Complicated by intermittent hemolysis. -Observe overnight on telemetry -Recheck repeat potassium in AM   Metabolic acidosis Likely secondary to AKI in setting of CKD stage IV. Patient is managed on sodium bicarbonate as an outpatient. Continue sodium bicarbonate.   Primary  hypertension Continue Coreg and hydralazine.   Chronic pancreatitis Continue Creon.   Hypothyroidism Continue Synthroid   GERD Continue Protonix   Diabetes mellitus type 2 Well controlled with recent hemoglobin A1C of 6.9%. Patient managed with insulin drip initially and transitioned to insulin regular and insulin aspart. Patient developed some hypoglycemia during admission that was responsive to oral juice intake. Resume home regimen on discharge.   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  DVT prophylaxis: SCDs Code Status:   Code Status: Prior Family Communication: None at bedside Disposition Plan: Discharge home likely in AM   Consultants:  Cardiology  Procedures:  None  Antimicrobials: None    Subjective: Patient reports no concerns this morning. Feels well. No chest pain.  Objective: BP (!) 141/83 (BP Location: Right Arm)   Pulse 100   Temp 98 F (36.7 C) (Oral)   Resp 19   Wt 61.5 kg   SpO2 95%   BMI 29.34 kg/m   Examination:  General exam: Appears calm and comfortable Respiratory system: Clear to auscultation. Respiratory effort normal. Cardiovascular system: S1 & S2 heard, RRR. Gastrointestinal system: Abdomen is nondistended, soft and nontender. No organomegaly or masses felt. Normal bowel sounds heard. Central nervous system: Alert and oriented. No focal neurological deficits. Musculoskeletal: No edema. No calf tenderness Skin: No cyanosis. No rashes Psychiatry: Judgement and insight appear normal. Mood & affect appropriate.   Data Reviewed: I have personally reviewed following labs and imaging studies  CBC Lab Results  Component Value Date   WBC 6.2 01/19/2023   RBC 2.94 (L) 01/19/2023   HGB 9.0 (L) 01/19/2023   HCT 26.8 (L) 01/19/2023   MCV 91.2 01/19/2023   MCH 30.6 01/19/2023   PLT 163 01/19/2023   MCHC 33.6 01/19/2023   RDW 16.6 (H) 01/19/2023   LYMPHSABS 3.0  01/19/2023   MONOABS 0.5  01/19/2023   EOSABS 0.2 01/19/2023   BASOSABS 0.0 01/19/2023     Last metabolic panel Lab Results  Component Value Date   NA 135 01/19/2023   K >7.5 (HH) 01/19/2023   CL 104 01/19/2023   CO2 17 (L) 01/19/2023   BUN 42 (H) 01/19/2023   CREATININE 1.75 (H) 01/19/2023   GLUCOSE 54 (L) 01/19/2023   GFRNONAA 39 (L) 01/19/2023   GFRAA 68 08/22/2019   CALCIUM 8.7 (L) 01/19/2023   PHOS 4.5 01/19/2023   PROT RESULTS UNAVAILABLE DUE TO INTERFERING SUBSTANCE 01/19/2023   ALBUMIN 3.7 01/19/2023   BILITOT 3.8 (H) 01/19/2023   ALKPHOS 153 (H) 01/19/2023   AST RESULTS UNAVAILABLE DUE TO INTERFERING SUBSTANCE 01/19/2023   ALT RESULTS UNAVAILABLE DUE TO INTERFERING SUBSTANCE 01/19/2023   ANIONGAP 14 01/19/2023    GFR: Estimated Creatinine Clearance: 35.1 mL/min (A) (by C-G formula based on SCr of 1.75 mg/dL (H)).  Recent Results (from the past 240 hour(s))  Urine Culture (for pregnant, neutropenic or urologic patients or patients with an indwelling urinary catheter)     Status: Abnormal   Collection Time: 01/13/23  3:02 AM   Specimen: Urine, Clean Catch  Result Value Ref Range Status   Specimen Description   Final    URINE, CLEAN CATCH Performed at [facility] [address] [place], [place] [address]    Special Requests   Final    NONE Performed at [facility], [address] [place], [place] [address]    Culture (A)  Final    80,000 COLONIES/mL GROUP B STREP(S.AGALACTIAE)ISOLATED TESTING AGAINST S. AGALACTIAE NOT ROUTINELY PERFORMED DUE TO PREDICTABILITY OF AMP/PEN/VAN SUSCEPTIBILITY. WITHIN MIXED Performed at [facility], [address] [place], [place] [address]    Report Status 01/14/2023 FINAL  Final      Radiology Studies: DG Chest 2 View  Result Date: 01/17/2023 CLINICAL DATA:  Chest pain, fevers, sweating and clamminess. EXAM: CHEST - 2 VIEW COMPARISON:  01/12/2023 FINDINGS: Surgical clip projected over the heart.  Mild cardiac enlargement. No vascular congestion, edema, or consolidation. No pleural effusions. No pneumothorax. Mediastinal contours appear intact. IMPRESSION: Mild cardiac enlargement.  Lungs are clear. Electronically Signed   By: Burman Nieves M.D.   On: 01/17/2023 22:15      LOS: 0 days    Jacquelin Hawking, MD [facility] 01/19/2023, 7:02 PM   If 7PM-7AM, please contact night-coverage [email or web address]

## 2023-01-19 NOTE — Discharge Summary (Addendum)
Physician Discharge Summary   Patient: Jennifer [name] MRN: [id number] DOB: 1990-09-15  Admit date:     01/17/2023  Discharge date: 01/20/23  Discharge Physician: Jacquelin Hawking, MD   PCP: Nathaneil Canary, PA-C   Recommendations at discharge:  PCP follow-up  Discharge Diagnoses: Principal Problem:   Chest pain Active Problems:   Familial hypertriglyceridemia   Supine hypertension   Orthostatic hypotension   Coronary artery disease involving native coronary artery of native heart without angina pectoris   Chest pain due to GERD   Type 2 MI (myocardial infarction) (HCC)   Atypical chest pain  Resolved Problems:   * No resolved hospital problems. *  Hospital Course: Jennifer [name] is a 32 y.o. female with a history of severe hypertriglyceridemia, mixed dyslipidemia, chronic pancreatitis, diabetes mellitus type 2, hypertension, hypothyroidism, CKD stage IIIb.  Patient presented secondary to chest pain which resolved prior to admission. Cardiology was consulted with recommendation for now ACS workup. Hospitalization complicated by AKI and hyperkalemia.  Assessment and Plan:  Atypical chest pain Resolved prior to admission. EKG non-ischemic. Troponin 76 > 81. Cardiology consulted with recommendation for no inpatient ischemic workup. Per cardiology, unlikely ACS. No changes to medications.  Severe hypertriglyceridemia No associated acute pancreatitis. Triglycerides >5000. Patient started on an insulin drip for management on admission. Insulin drip discontinued secondary to chronic hypertriglyceridemia without acute illness to treat. Continue home fenofibrate, Crestor and Vascepa.  Chronic systolic heart failure LVEF of 40-45%. Dehydrated on admission and provided with IV hydration. No evidence of fluid overload. Resume home torsemide on discharge  Pseudohyponatremia Secondary to severe hypertriglyceridemia.  AKI on CKD IIIb Baseline creatinine appears to be around 1.5.  Conflicting diagnoses for CKD noted in the chart. Creatinine prior to admission of 1.04, but this appears to be aberrant. Creatinine up to a peak of 2.11 with improvement to 1.75 prior to discharge.  Hyperkalemia Probably related to AKI. Patient with multiple blood samples consistent with hemolysis. Patient treated with [facility] initially and prior to discharge, laboratory was able to get a non-hemolyzed sample with potassium of 4.4.  Metabolic acidosis Likely secondary to AKI in setting of CKD stage IV. Patient is managed on sodium bicarbonate as an outpatient. Continue sodium bicarbonate.  Primary hypertension Continue Coreg and hydralazine.  Chronic pancreatitis Continue Creon.  Hypothyroidism Continue Synthroid  GERD Continue Protonix  Diabetes mellitus type 2 Well controlled with recent hemoglobin A1C of 6.9%. Patient managed with insulin drip initially and transitioned to insulin regular and insulin aspart. Patient developed some hypoglycemia during admission that was responsive to oral juice intake. Resume home regimen on discharge.  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   Consultants: None Procedures performed: None  Disposition: Home Diet recommendation: Carb modified, low fat   DISCHARGE MEDICATION: Allergies as of 01/20/2023       Reactions   Ketamine Other (See Comments)   In vegetative state for 15 minutes per pt   [place] Itching   Itchy throat Itchy throat Itchy throat Itchy throat Itchy throat    Itchy throat  Itchy throat Itchy throat Itchy throat Itchy throat    Itchy throat   Morphine Itching, Rash, Hives   Sorethroat   Penicillins Hives, Itching, Rash   Has patient had a PCN reaction causing immediate rash, facial/tongue/throat swelling, SOB or lightheadedness with hypotension: Y Has patient had a PCN reaction causing severe rash involving mucus membranes or skin necrosis: Y Has patient had a PCN reaction  that required  hospitalization: N Has patient had a PCN reaction occurring within the last 10 years: Y   Clarithromycin Other (See Comments)   Stomach pain   Erythromycin    Stomach pain   Fentanyl Nausea And Vomiting   Penicillin V Hives, Itching, Other (See Comments)   Gi upset, also   Shellfish Allergy Other (See Comments)   Pt has never had shellfish but tested positive on allergy test. Pt states contrast in CT is okay   Prednisone Rash        Medication List     TAKE these medications    amitriptyline 10 MG tablet Commonly known as: ELAVIL Take 10 mg by mouth at bedtime.   aspirin EC 81 MG tablet Take 1 tablet (81 mg total) by mouth daily. Swallow whole.   butalbital-acetaminophen-caffeine 50-325-40 MG tablet Commonly known as: FIORICET Take 1 tablet by mouth daily as needed for headache.   carvedilol 3.125 MG tablet Commonly known as: COREG Take 1 tablet (3.125 mg total) by mouth 2 (two) times daily.   Creon 36000-114000 units Cpep capsule Generic drug: lipase/protease/amylase Take 36,000 Units by mouth 3 (three) times daily.   dicyclomine 20 MG tablet Commonly known as: BENTYL Take 1 tablet (20 mg total) by mouth 2 (two) times daily. What changed:  when to take this reasons to take this   DULoxetine 60 MG capsule Commonly known as: CYMBALTA Take 60 mg by mouth at bedtime.   ergocalciferol 1.25 MG (50000 UT) capsule Commonly known as: VITAMIN D2 Take 50,000 Units by mouth every 7 (seven) days. Every Sunday   fenofibrate 54 MG tablet Take 1 tablet (54 mg total) by mouth daily.   HumuLIN R U-500 KwikPen 500 UNIT/ML KwikPen Generic drug: insulin regular human CONCENTRATED Inject 185 Units into the skin 3 (three) times daily with meals. What changed: how much to take   hydrALAZINE 50 MG tablet Commonly known as: APRESOLINE Take 50 mg by mouth 3 (three) times daily.   insulin lispro 100 UNIT/ML injection Commonly known as: HUMALOG Inject 20 Units  into the skin 3 (three) times daily before meals.   Insulin Pen Needle 32G X 4 MM Misc 1 Device by Does not apply route QID. For use with insulin pens   ivabradine 7.5 MG Tabs tablet Commonly known as: CORLANOR Take 1 tablet (7.5 mg total) by mouth 2 (two) times daily with a meal.   levocetirizine 5 MG tablet Commonly known as: XYZAL Take 5 mg by mouth daily.   levothyroxine 137 MCG tablet Commonly known as: SYNTHROID Take 137 mcg by mouth daily.   pantoprazole 40 MG tablet Commonly known as: PROTONIX Take 40 mg by mouth daily.   pregabalin 300 MG capsule Commonly known as: LYRICA Take 300 mg by mouth at bedtime.   prochlorperazine 5 MG tablet Commonly known as: COMPAZINE Take 5 mg by mouth at bedtime.   Repatha 140 MG/ML Sosy Generic drug: Evolocumab Inject 140 mg into the skin See admin instructions. Inject 140 mg subcutaneously every other Thursday evening   rosuvastatin 40 MG tablet Commonly known as: CRESTOR Take 40 mg by mouth daily.   sodium bicarbonate 650 MG tablet Take 650 mg by mouth 2 (two) times daily.   topiramate 50 MG tablet Commonly known as: TOPAMAX Take 50 mg by mouth 2 (two) times daily.   torsemide 20 MG tablet Commonly known as: DEMADEX Take 2 tablets (40 mg total) by mouth daily. May take 1 additional tablet as needed   tretinoin 0.05 %  cream Commonly known as: RETIN-A Apply 1 application topically at bedtime.   Vascepa 1 g capsule Generic drug: icosapent Ethyl Take 2 capsules (2 g total) by mouth 2 (two) times daily.        Follow-up Information     Nathaneil Canary, PA-C. Schedule an appointment as soon as possible for a visit in 1 week(s).   Specialty: Physician Assistant Why: For hospital follow-up Contact information: [address] [place] [address] [place] [address] [phone]                Discharge Exam: BP 130/74 (BP Location: Left Arm)   Pulse (!) 106   Temp 98.1 F (36.7 C) (Oral)   Resp 20   Ht  4\' 9"  (1.448 m)   Wt 61.6 kg   SpO2 98%   BMI 29.39 kg/m   General exam: Appears calm and comfortable Respiratory system: Clear to auscultation. Respiratory effort normal. Cardiovascular system: S1 & S2 heard, RRR. Gastrointestinal system: Abdomen is nondistended, soft and nontender. No organomegaly or masses felt. Normal bowel sounds heard. Central nervous system: Alert and oriented. No focal neurological deficits. Musculoskeletal: No edema. No calf tenderness Skin: No cyanosis. No rashes Psychiatry: Judgement and insight appear normal. Mood & affect appropriate.   Condition at discharge: stable  The results of significant diagnostics from this hospitalization (including imaging, microbiology, ancillary and laboratory) are listed below for reference.   Imaging Studies: DG Chest 2 View  Result Date: 01/17/2023 CLINICAL DATA:  Chest pain, fevers, sweating and clamminess. EXAM: CHEST - 2 VIEW COMPARISON:  01/12/2023 FINDINGS: Surgical clip projected over the heart. Mild cardiac enlargement. No vascular congestion, edema, or consolidation. No pleural effusions. No pneumothorax. Mediastinal contours appear intact. IMPRESSION: Mild cardiac enlargement.  Lungs are clear. Electronically Signed   By: Burman Nieves M.D.   On: 01/17/2023 22:15   CT Angio Chest PE W and/or Wo Contrast  Result Date: 01/13/2023 CLINICAL DATA:  Abdominal pain. High probability for PE EXAM: CT ANGIOGRAPHY CHEST CT ABDOMEN AND PELVIS WITH CONTRAST TECHNIQUE: Multidetector CT imaging of the chest was performed using the standard protocol during bolus administration of intravenous contrast. Multiplanar CT image reconstructions and MIPs were obtained to evaluate the vascular anatomy. Multidetector CT imaging of the abdomen and pelvis was performed using the standard protocol during bolus administration of intravenous contrast. RADIATION DOSE REDUCTION: This exam was performed according to the departmental  dose-optimization program which includes automated exposure control, adjustment of the mA and/or kV according to patient size and/or use of iterative reconstruction technique. CONTRAST:  OMNIPAQUE IOHEXOL 350 MG/ML SOLN COMPARISON:  CT abdomen and pelvis 01/02/2023. CT angiogram chest 10/18/2022. FINDINGS: CTA CHEST FINDINGS Cardiovascular: Satisfactory opacification of the pulmonary arteries to the segmental level. No evidence of pulmonary embolism. Normal heart size. There is a small pericardial effusion which is new from prior. Thin linear radiopaque surgical clip or coil seen within a left lower lobe pulmonary artery branch, unchanged from prior. Mediastinum/Nodes: No enlarged mediastinal, hilar, or axillary lymph nodes. Thyroid gland, trachea, and esophagus demonstrate no significant findings. Lungs/Pleura: There some minimal mild ground-glass opacities in the bilateral lower lobes. The lungs are otherwise clear. No pleural effusion or pneumothorax. Musculoskeletal: No chest wall abnormality. No acute or significant osseous findings. Review of the MIP images confirms the above findings. CT ABDOMEN and PELVIS FINDINGS Hepatobiliary: The liver is enlarged, unchanged. No focal liver lesions are seen. The gallbladder and bile ducts are within normal limits. Pancreas: Unremarkable. No  pancreatic ductal dilatation or surrounding inflammatory changes. Spleen: Mildly enlarged, unchanged. Adrenals/Urinary Tract: There is mild asymmetric wall thickening of the left-sided bladder as noted on prior. The kidneys and adrenal glands are within normal limits. Stomach/Bowel: Stomach is within normal limits. Appendix appears normal. No evidence of bowel wall thickening, distention, or inflammatory changes. Vascular/Lymphatic: There are atherosclerotic calcifications of the aorta. Aortic caliber is small measuring 1 cm in the infrarenal portion, unchanged. IVC within normal limits. No enlarged lymph nodes are identified.  Reproductive: Uterus and bilateral adnexa are unremarkable. Other: There is no ascites or focal abdominal wall hernia. There is focal subcutaneous density lateral to the right buttock measuring 1.6 x 3.0 by 4.1 cm, possibly hematoma in the setting trauma. Other etiologies are not excluded. There is mild diffuse subcutaneous edema of the buttocks. There is also an 8 mm rounded density in the subcutaneous tissues of the anterior lower left abdominal wall. Musculoskeletal: No acute or significant osseous findings. Review of the MIP images confirms the above findings. IMPRESSION: 1. No evidence for pulmonary embolism. 2. New small pericardial effusion. 3. Minimal ground-glass opacities in the lower lobes may represent mild edema or infection. 4. Subcutaneous density lateral to the right buttock may represent hematoma in the setting of trauma. Other etiologies are not excluded. There is also an 8 mm rounded density in the subcutaneous tissues of the anterior lower left abdominal wall which may represent a small hematoma. 5. Stable hepatosplenomegaly. 6. Stable asymmetric left-sided bladder wall thickening. Correlate clinically. Aortic Atherosclerosis (ICD10-I70.0). Electronically Signed   By: Darliss Cheney M.D.   On: 01/13/2023 00:38   CT ABDOMEN PELVIS W CONTRAST  Result Date: 01/13/2023 CLINICAL DATA:  Abdominal pain. High probability for PE EXAM: CT ANGIOGRAPHY CHEST CT ABDOMEN AND PELVIS WITH CONTRAST TECHNIQUE: Multidetector CT imaging of the chest was performed using the standard protocol during bolus administration of intravenous contrast. Multiplanar CT image reconstructions and MIPs were obtained to evaluate the vascular anatomy. Multidetector CT imaging of the abdomen and pelvis was performed using the standard protocol during bolus administration of intravenous contrast. RADIATION DOSE REDUCTION: This exam was performed according to the departmental dose-optimization program which includes automated  exposure control, adjustment of the mA and/or kV according to patient size and/or use of iterative reconstruction technique. CONTRAST:  OMNIPAQUE IOHEXOL 350 MG/ML SOLN COMPARISON:  CT abdomen and pelvis 01/02/2023. CT angiogram chest 10/18/2022. FINDINGS: CTA CHEST FINDINGS Cardiovascular: Satisfactory opacification of the pulmonary arteries to the segmental level. No evidence of pulmonary embolism. Normal heart size. There is a small pericardial effusion which is new from prior. Thin linear radiopaque surgical clip or coil seen within a left lower lobe pulmonary artery branch, unchanged from prior. Mediastinum/Nodes: No enlarged mediastinal, hilar, or axillary lymph nodes. Thyroid gland, trachea, and esophagus demonstrate no significant findings. Lungs/Pleura: There some minimal mild ground-glass opacities in the bilateral lower lobes. The lungs are otherwise clear. No pleural effusion or pneumothorax. Musculoskeletal: No chest wall abnormality. No acute or significant osseous findings. Review of the MIP images confirms the above findings. CT ABDOMEN and PELVIS FINDINGS Hepatobiliary: The liver is enlarged, unchanged. No focal liver lesions are seen. The gallbladder and bile ducts are within normal limits. Pancreas: Unremarkable. No pancreatic ductal dilatation or surrounding inflammatory changes. Spleen: Mildly enlarged, unchanged. Adrenals/Urinary Tract: There is mild asymmetric wall thickening of the left-sided bladder as noted on prior. The kidneys and adrenal glands are within normal limits. Stomach/Bowel: Stomach is within normal limits. Appendix appears normal. No  evidence of bowel wall thickening, distention, or inflammatory changes. Vascular/Lymphatic: There are atherosclerotic calcifications of the aorta. Aortic caliber is small measuring 1 cm in the infrarenal portion, unchanged. IVC within normal limits. No enlarged lymph nodes are identified. Reproductive: Uterus and bilateral adnexa are  unremarkable. Other: There is no ascites or focal abdominal wall hernia. There is focal subcutaneous density lateral to the right buttock measuring 1.6 x 3.0 by 4.1 cm, possibly hematoma in the setting trauma. Other etiologies are not excluded. There is mild diffuse subcutaneous edema of the buttocks. There is also an 8 mm rounded density in the subcutaneous tissues of the anterior lower left abdominal wall. Musculoskeletal: No acute or significant osseous findings. Review of the MIP images confirms the above findings. IMPRESSION: 1. No evidence for pulmonary embolism. 2. New small pericardial effusion. 3. Minimal ground-glass opacities in the lower lobes may represent mild edema or infection. 4. Subcutaneous density lateral to the right buttock may represent hematoma in the setting of trauma. Other etiologies are not excluded. There is also an 8 mm rounded density in the subcutaneous tissues of the anterior lower left abdominal wall which may represent a small hematoma. 5. Stable hepatosplenomegaly. 6. Stable asymmetric left-sided bladder wall thickening. Correlate clinically. Aortic Atherosclerosis (ICD10-I70.0). Electronically Signed   By: Darliss Cheney M.D.   On: 01/13/2023 00:38   DG Chest 2 View  Result Date: 01/12/2023 CLINICAL DATA:  Sudden onset mid chest pain EXAM: CHEST - 2 VIEW COMPARISON:  Radiograph 01/09/2023 FINDINGS: Stable cardiomediastinal silhouette. CardioMEMS device. No focal consolidation, pleural effusion, or pneumothorax. No displaced rib fractures. IMPRESSION: No active cardiopulmonary disease. Electronically Signed   By: Minerva Fester M.D.   On: 01/12/2023 22:09   DG Chest 2 View  Result Date: 01/09/2023 CLINICAL DATA:  Screening. Dizziness, blurred vision. Abdominal distention. EXAM: CHEST - 2 VIEW COMPARISON:  10/17/2022. FINDINGS: The heart size and mediastinal contours are within normal limits. Lung volumes are low with likely atelectasis at the lung bases. No  consolidation, effusion, or pneumothorax. No acute osseous abnormality. IMPRESSION: No active cardiopulmonary disease. Electronically Signed   By: Thornell Sartorius M.D.   On: 01/09/2023 01:11   US Abdomen Limited RUQ (LIVER/GB)  Result Date: 01/02/2023 CLINICAL DATA:  Elevated liver function tests. EXAM: ULTRASOUND ABDOMEN LIMITED RIGHT UPPER QUADRANT COMPARISON:  February 07, 2022 FINDINGS: Gallbladder: No gallstones or wall thickening visualized (1.6 mm). No sonographic Murphy sign noted by sonographer. Common bile duct: Diameter: 4.2 mm Liver: The liver is enlarged and measures 23.1 cm in length. No focal lesion identified. Diffusely increased echogenicity of the liver parenchyma is noted. Portal vein is patent on color Doppler imaging with normal direction of blood flow towards the liver. Other: None. IMPRESSION: Hepatomegaly and hepatic steatosis, without focal liver lesions. Electronically Signed   By: Aram Candela M.D.   On: 01/02/2023 20:29   CT ABDOMEN PELVIS W CONTRAST  Result Date: 01/02/2023 CLINICAL DATA:  Abdominal pain, diarrhea, nausea and vomiting EXAM: CT ABDOMEN AND PELVIS WITH CONTRAST TECHNIQUE: Multidetector CT imaging of the abdomen and pelvis was performed using the standard protocol following bolus administration of intravenous contrast. RADIATION DOSE REDUCTION: This exam was performed according to the departmental dose-optimization program which includes automated exposure control, adjustment of the mA and/or kV according to patient size and/or use of iterative reconstruction technique. CONTRAST:  80mL OMNIPAQUE IOHEXOL 300 MG/ML  SOLN COMPARISON:  10/18/2022 FINDINGS: Lower chest: No acute abnormality. Linear metallic foreign body which appears to be within a  subsegmental artery of the posterior segment of the left lower lobe, as seen by prior examination (series 2, image 1). Hepatobiliary: No solid liver abnormality is seen. Hepatomegaly, maximum coronal span 21.8 cm. Hepatic  steatosis. No gallstones, gallbladder wall thickening, or biliary dilatation. Pancreas: Unremarkable. No pancreatic ductal dilatation or surrounding inflammatory changes. Spleen: Splenomegaly, maximum coronal span 14.1 cm. Adrenals/Urinary Tract: Adrenal glands are unremarkable. Kidneys are normal, without renal calculi, solid lesion, or hydronephrosis. Urinary bladder wall thickening and adjacent fat stranding (series 2, image 70). Stomach/Bowel: Stomach is within normal limits. Appendix appears normal. No evidence of bowel wall thickening, distention, or inflammatory changes. Vascular/Lymphatic: Severe aortic atherosclerosis. No enlarged abdominal or pelvic lymph nodes. Reproductive: No mass or other significant abnormality. Small benign functional ovarian follicles, requiring no further follow-up or characterization. Other: No abdominal wall hernia or abnormality. No ascites. Musculoskeletal: No acute or significant osseous findings. IMPRESSION: 1. Urinary bladder wall thickening and adjacent fat stranding, suggestive of nonspecific infectious or inflammatory cystitis. Correlate with urinalysis. 2. Hepatomegaly and hepatic steatosis 3. Linear metallic foreign body which appears to be within a subsegmental artery of the posterior segment of the left lower lobe, as seen by multiple prior examinations and of uncertain nature. 4. Severe abdominal aortic atherosclerosis markedly advanced for patient age. Aortic Atherosclerosis (ICD10-I70.0). Electronically Signed   By: Jearld Lesch M.D.   On: 01/02/2023 18:21    Microbiology: Results for orders placed or performed during the hospital encounter of 01/12/23  Urine Culture (for pregnant, neutropenic or urologic patients or patients with an indwelling urinary catheter)     Status: Abnormal   Collection Time: 01/13/23  3:02 AM   Specimen: Urine, Clean Catch  Result Value Ref Range Status   Specimen Description   Final    URINE, CLEAN CATCH Performed at [facility], [address] [place], [place] [address]    Special Requests   Final    NONE Performed at [facility], [address], [place], [place] [address]    Culture (A)  Final    80,000 COLONIES/mL GROUP B STREP(S.AGALACTIAE)ISOLATED TESTING AGAINST S. AGALACTIAE NOT ROUTINELY PERFORMED DUE TO PREDICTABILITY OF AMP/PEN/VAN SUSCEPTIBILITY. WITHIN MIXED Performed at [facility], [address], [place], [place] [address]    Report Status 01/14/2023 FINAL  Final    Labs: CBC: Recent Labs  Lab 01/17/23 2001 01/18/23 0918 01/18/23 1004 01/19/23 0032 01/20/23 1239  WBC 7.7  --   --  6.2 6.5  NEUTROABS  --   --   --  2.5 3.2  HGB 12.1 12.2 11.9* 9.0* 11.1*  HCT 33.4* 36.0 35.0* 26.8* 29.9*  MCV 89.3  --   --  91.2 92.0  PLT 220  --   --  163 220   Basic Metabolic Panel: Recent Labs  Lab 01/18/23 0857 01/18/23 0918 01/18/23 1004 01/18/23 1624 01/19/23 0032 01/19/23 0945 01/19/23 1530 01/19/23 1858 01/20/23 0650 01/20/23 1239  NA 119*   < > 128* 133* 125* 133* 135  --   --   --   K 5.3*   < > 5.3* 5.7* 5.6* 7.4* >7.5* 5.6* 6.2* 4.4  CL 87*  --   --  99 92* 101 104  --   --   --   CO2 13*  --   --  19* 20* 19* 17*  --   --   --   GLUCOSE 351*  --   --  254* 162* 210* 54*  --   --   --  BUN 48*  --   --  50* 45* 41* 42*  --   --   --   CREATININE 2.11*  --   --  2.03* 1.43* 1.80* 1.75*  --   --   --   CALCIUM 9.7  --   --  9.4 8.4* 8.6* 8.7*  --   --   --   MG  --   --   --  2.3 3.0*  --   --   --   --   --   PHOS  --   --   --   --  4.5  --   --   --   --   --    < > = values in this interval not displayed.   Liver Function Tests: Recent Labs  Lab 01/17/23 2001 01/18/23 1624 01/19/23 0032 01/19/23 0945 01/20/23 1239  AST UNABLE TO DETERMINE DUE TO LIPEMIA 114* 113* RESULTS UNAVAILABLE DUE TO INTERFERING SUBSTANCE  --   ALT UNABLE TO DETERMINE DUE TO LIPEMIA 170* 130* RESULTS UNAVAILABLE DUE TO INTERFERING SUBSTANCE   --   ALKPHOS 210* 205* 152* 153*  --   BILITOT 1.3* 0.8 1.9* 3.8* 0.8  PROT UNABLE TO DETERMINE DUE TO LIPEMIA 7.8 6.7 RESULTS UNAVAILABLE DUE TO INTERFERING SUBSTANCE  --   ALBUMIN 3.9 4.4 3.4* 3.7  --    CBG: Recent Labs  Lab 01/19/23 2111 01/19/23 2322 01/19/23 2341 01/20/23 0832 01/20/23 1134  GLUCAP 110* 52* 92 248* 244*    Discharge time spent: 35 minutes.  Signed: Jacquelin Hawking, MD [facility] 01/20/2023

## 2023-01-19 NOTE — Progress Notes (Signed)
Mobility Specialist Progress Note:   01/19/23 1500  Mobility  Activity Ambulated independently in hallway  Level of Assistance Standby assist, set-up cues, supervision of patient - no hands on  Assistive Device None  Distance Ambulated (ft) 400 ft  Activity Response Tolerated well  Mobility Referral Yes  $Mobility charge 1 Mobility  Mobility Specialist Start Time (ACUTE ONLY) 1457  Mobility Specialist Stop Time (ACUTE ONLY) 1504  Mobility Specialist Time Calculation (min) (ACUTE ONLY) 7 min    Pre Mobility: 106 HR During Mobility: 130 HR Post Mobility:  123 HR  Pt received in bed, agreeable to mobility. Asymptomatic throughout w/ no complaints. Pt left on EOB with call bell and all needs met.  Jennifer [name] Mobility Specialist Please contact via Special educational needs teacher or Rehab office at [phone]

## 2023-01-19 NOTE — Hospital Course (Addendum)
Jennifer [name] is a 32 y.o. female with a history of severe hypertriglyceridemia, mixed dyslipidemia, chronic pancreatitis, diabetes mellitus type 2, hypertension, hypothyroidism, CKD stage IIIb.  Patient presented secondary to chest pain which resolved prior to admission. Cardiology was consulted with recommendation for now ACS workup. Hospitalization complicated by AKI and hyperkalemia.

## 2023-01-19 NOTE — Progress Notes (Signed)
Hypoglycemic Event  CBG: 68   Treatment: 4 oz juice/soda  Symptoms: Sweaty, Shaky, and Nervous/irritable  Follow-up CBG: Time:1653  CBG Result:143  Possible Reasons for Event: Medication regimen:    Comments/MD notified: Change in insulin dose    Jennifer [name]

## 2023-01-20 DIAGNOSIS — E781 Pure hyperglyceridemia: Secondary | ICD-10-CM

## 2023-01-20 DIAGNOSIS — I2510 Atherosclerotic heart disease of native coronary artery without angina pectoris: Secondary | ICD-10-CM | POA: Diagnosis not present

## 2023-01-20 DIAGNOSIS — K219 Gastro-esophageal reflux disease without esophagitis: Secondary | ICD-10-CM | POA: Diagnosis not present

## 2023-01-20 DIAGNOSIS — R079 Chest pain, unspecified: Secondary | ICD-10-CM | POA: Diagnosis not present

## 2023-01-20 DIAGNOSIS — R0789 Other chest pain: Secondary | ICD-10-CM | POA: Diagnosis not present

## 2023-01-20 DIAGNOSIS — E875 Hyperkalemia: Secondary | ICD-10-CM

## 2023-01-20 LAB — BILIRUBIN, FRACTIONATED(TOT/DIR/INDIR)
Bilirubin, Direct: 0.2 mg/dL (ref 0.0–0.2)
Indirect Bilirubin: 0.6 mg/dL (ref 0.3–0.9)
Total Bilirubin: 0.8 mg/dL (ref 0.3–1.2)

## 2023-01-20 LAB — GLUCOSE, CAPILLARY
Glucose-Capillary: 248 mg/dL — ABNORMAL HIGH (ref 70–99)
Glucose-Capillary: 244 mg/dL — ABNORMAL HIGH (ref 70–99)

## 2023-01-20 LAB — CBC WITH DIFFERENTIAL/PLATELET
Abs Immature Granulocytes: 0.06 10*3/uL (ref 0.00–0.07)
Basophils Absolute: 0.1 10*3/uL (ref 0.0–0.1)
Basophils Relative: 1 %
Eosinophils Absolute: 0.2 10*3/uL (ref 0.0–0.5)
Eosinophils Relative: 2 %
HCT: 29.9 % — ABNORMAL LOW (ref 36.0–46.0)
Hemoglobin: 11.1 g/dL — ABNORMAL LOW (ref 12.0–15.0)
Immature Granulocytes: 1 %
Lymphocytes Relative: 43 %
Lymphs Abs: 2.8 10*3/uL (ref 0.7–4.0)
MCH: 34.2 pg — ABNORMAL HIGH (ref 26.0–34.0)
MCHC: 37.1 g/dL — ABNORMAL HIGH (ref 30.0–36.0)
MCV: 92 fL (ref 80.0–100.0)
Monocytes Absolute: 0.3 10*3/uL (ref 0.1–1.0)
Monocytes Relative: 4 %
Neutro Abs: 3.2 10*3/uL (ref 1.7–7.7)
Neutrophils Relative %: 49 %
Platelets: 220 10*3/uL (ref 150–400)
RBC: 3.25 MIL/uL — ABNORMAL LOW (ref 3.87–5.11)
RDW: 16.9 % — ABNORMAL HIGH (ref 11.5–15.5)
WBC: 6.5 10*3/uL (ref 4.0–10.5)
nRBC: 0 % (ref 0.0–0.2)

## 2023-01-20 LAB — RETICULOCYTES
Immature Retic Fract: 24.3 % — ABNORMAL HIGH (ref 2.3–15.9)
RBC.: 3.17 MIL/uL — ABNORMAL LOW (ref 3.87–5.11)
Retic Count, Absolute: 172.4 10*3/uL (ref 19.0–186.0)
Retic Ct Pct: 5.4 % — ABNORMAL HIGH (ref 0.4–3.1)

## 2023-01-20 LAB — TECHNOLOGIST SMEAR REVIEW: Plt Morphology: NORMAL

## 2023-01-20 LAB — DIRECT ANTIGLOBULIN TEST (NOT AT ARMC)
DAT, IgG: NEGATIVE
DAT, complement: NEGATIVE

## 2023-01-20 LAB — POTASSIUM
Potassium: 6.2 mmol/L — ABNORMAL HIGH (ref 3.5–5.1)
Potassium: 4.4 mmol/L (ref 3.5–5.1)

## 2023-01-20 LAB — LACTATE DEHYDROGENASE: LDH: 243 U/L — ABNORMAL HIGH (ref 98–192)

## 2023-01-20 MED ORDER — TORSEMIDE 20 MG PO TABS: 40.0000 mg | ORAL_TABLET | Freq: Every day | ORAL | Status: DC

## 2023-01-20 NOTE — Progress Notes (Signed)
Pt being d/c, VSS, IV removed, Education complete.   Balinda Quails, RN 01/20/2023 3:09 PM

## 2023-01-20 NOTE — Discharge Instructions (Signed)
Jennifer [name],  You were in the hospital with chest pain, but thankfully this resolved. The cardiologist evaluated you and did not think you required intervention while in the hospital. You were found to have high potassium. This was complicated by difficulties obtaining a good blood sample but your potassium is now showing to be in a good level. Please follow-up with your PCP. Although I have recommended for you to continue your ivabradine, please defer to your cardiologist's recommendations on if to continue holding this medication. Please follow-up with your cardiologist when you can.

## 2023-01-20 NOTE — Progress Notes (Signed)
Mobility Specialist Progress Note:   01/20/23 1400  Mobility  Activity Ambulated with assistance in hallway  Level of Assistance Independent  Assistive Device None  Distance Ambulated (ft) 400 ft  Activity Response Tolerated well  Mobility Referral Yes  $Mobility charge 1 Mobility  Mobility Specialist Start Time (ACUTE ONLY) 1412  Mobility Specialist Stop Time (ACUTE ONLY) 1419  Mobility Specialist Time Calculation (min) (ACUTE ONLY) 7 min    Pre Mobility: 104 HR During Mobility: 125 HR Post Mobility:  115 HR  Pt received in bed, agreeable to mobility. No complaints other than wanting to go home. Asymptomatic throughout. Pt left on EOB with call bell and all needs met.  D'Vante Earlene Plater Mobility Specialist Please contact via Special educational needs teacher or Rehab office at [phone]

## 2023-01-20 NOTE — Progress Notes (Signed)
Patient Name: Jennifer [name] Date of Encounter: 01/20/2023 [facility] Cardiologist: Yates Decamp, MD   Interval Summary  .    Asked by Dr. Jacquelin Hawking regarding hyperkalemia and further input from cardiology.  Patient presently doing well and has not had any chest pain or dyspnea.  Vital Signs .    Vitals:   01/19/23 2305 01/20/23 0352 01/20/23 0836 01/20/23 1136  BP: (!) 143/83 137/85 130/75 130/74  Pulse: (!) 104 (!) 107 (!) 106   Resp: 19 19 16  (!) 21  Temp: 98.2 F (36.8 C) 98.2 F (36.8 C) 98.2 F (36.8 C)   TempSrc: Oral Oral Oral   SpO2: 95% 94% 99% 98%  Weight:   61.6 kg   Height:   4\' 9"  (1.448 m)     Intake/Output Summary (Last 24 hours) at 01/20/2023 1138 Last data filed at 01/20/2023 1000 Gross per 24 hour  Intake 2390.24 ml  Output 700 ml  Net 1690.24 ml      01/20/2023    8:36 AM 01/19/2023    3:16 AM 01/12/2023    6:16 PM  Last 3 Weights  Weight (lbs) 135 lb 12.9 oz 135 lb 9.3 oz 130 lb  Weight (kg) 61.6 kg 61.5 kg 58.968 kg      Telemetry/ECG    Sinus Tachycardia - Personally Reviewed  Physical Exam .    Physical Exam Neck:     Vascular: No carotid bruit or JVD.  Cardiovascular:     Rate and Rhythm: Regular rhythm. Tachycardia present.     Pulses: Intact distal pulses.     Heart sounds: Normal heart sounds. No murmur heard.    No gallop.  Pulmonary:     Effort: Pulmonary effort is normal.     Breath sounds: Normal breath sounds.  Abdominal:     General: Bowel sounds are normal.     Palpations: Abdomen is soft.  Musculoskeletal:     Right lower leg: No edema.     Left lower leg: No edema.   Right and left heart catheterization 12/03/2020: Normal right heart catheterization pressures.  RA 4/1, mean 1; RV 23/4, EDP 6; PA 22/8, mean 11 and PA saturation 76%.  PW 9/7, mean 7 mmHg. CO 5.6, CI 3.88.  QP/QS 1.00. LV: 136/9, EDP 17 mmHg.  Ao 131/86, mean 105 mmHg.  No pressure gradient across the aortic valve. RCA: A very  small but dominant RCA which is diffusely diseased around 80%.  Mid segment 99% occlusion.  Conus branch collateralizes distal RCA. LM: Large, smooth and normal. LAD: Large vessel, minimal diffuse disease.  Small to moderate-sized D1 and D2. CX: Large vessel.  Minimal diffuse disease.  Gives origin to large OM1 and OM 2.     Impression: Nonischemic cardiomyopathy, single vessel occlusion does not explain her markedly reduced LVEF.  Medical therapy for CAD and nonischemic cardiomyopathy.   Echocardiogram 10/04/2022:  1. Left ventricular ejection fraction, by estimation, is 50 to 55%. The left ventricle has low normal function. The left ventricle has no regional wall motion abnormalities. Left ventricular diastolic parameters are indeterminate. Elevated left  ventricular end-diastolic pressure.  2. Right ventricular systolic function is normal. The right ventricular size is normal. There is normal pulmonary artery systolic pressure. The estimated right ventricular systolic pressure is 22.0 mmHg.  3. The mitral valve is normal in structure. Mild mitral valve regurgitation. No evidence of mitral stenosis.  4. The aortic valve is tricuspid. Aortic valve regurgitation is mild. No aortic  stenosis is present.  5. The inferior vena cava is normal in size with greater than 50% respiratory variability, suggesting right atrial pressure of 3 mmHg.  Assessment & Plan .     Jennifer [name] is a 32 y.o. 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  1.  Coronary artery disease of native vessel without angina pectoris.  Has high-grade stenosis of RCA and mild disease in the left by cardiac catheterization in 2022. She remains  asymptomatic without chest pain, no clinical evidence of heart failure. 2.  Persistent hyperkalemia. I suspect this could be due to hemolysis due to familial hypertriglyceridemia, patient's blood had to be spent to obtain labs and I suspect we should consider doing a peripheral smear and if he has no hemolysis, we could contact the lab to see if they are able to run potassium levels without spinning and would consider nephrology consultation if persistent hyperkalemia.   3.  Diabetes mellitus type 2 uncontrolled with hyperglycemia on insulin. Patient's diabetes is now much improved while in the hospital, on further questioning, patient admits to excessive eating at home.  Again discussed with her that if she were to continue to eat less and watch her carbohydrate intake, her triglyceride level would also improve with overall improvement in health.  Patient appeared nonchalant about this. 4.  Primary hypertension Patient extremely sensitive to antihypertensive medications, presently on carvedilol and hydralazine, blood pressure remained stable, continue the same.  Not on an ACE inhibitor or ARB due to stage IIIb-IV chronic kidney disease.    From cardiac standpoint she remains stable, her nonischemic cardiomyopathy has resolved and her EF has been preserved and normal.  Medical therapy for underlying CAD.   For questions or updates, please contact [facility] Please consult [email or web address] for contact info under    Yates Decamp, MD, [facility] 01/20/2023, 11:44 AM [facility] [address] [place], [place] [address] Phone: [phone]. Fax:  [phone]

## 2023-01-23 DIAGNOSIS — Z9189 Other specified personal risk factors, not elsewhere classified: Secondary | ICD-10-CM | POA: Diagnosis not present

## 2023-01-24 DIAGNOSIS — R3129 Other microscopic hematuria: Secondary | ICD-10-CM | POA: Diagnosis not present

## 2023-01-24 DIAGNOSIS — R300 Dysuria: Secondary | ICD-10-CM | POA: Diagnosis not present

## 2023-01-24 DIAGNOSIS — R319 Hematuria, unspecified: Secondary | ICD-10-CM | POA: Diagnosis not present

## 2023-01-24 DIAGNOSIS — Z7689 Persons encountering health services in other specified circumstances: Secondary | ICD-10-CM | POA: Diagnosis not present

## 2023-01-24 DIAGNOSIS — N390 Urinary tract infection, site not specified: Secondary | ICD-10-CM | POA: Diagnosis not present

## 2023-01-25 DIAGNOSIS — Z7689 Persons encountering health services in other specified circumstances: Secondary | ICD-10-CM | POA: Diagnosis not present

## 2023-01-25 DIAGNOSIS — H34832 Tributary (branch) retinal vein occlusion, left eye, with macular edema: Secondary | ICD-10-CM | POA: Diagnosis not present

## 2023-01-26 DIAGNOSIS — E782 Mixed hyperlipidemia: Secondary | ICD-10-CM | POA: Diagnosis not present

## 2023-01-26 DIAGNOSIS — E139 Other specified diabetes mellitus without complications: Secondary | ICD-10-CM | POA: Diagnosis not present

## 2023-01-26 DIAGNOSIS — E781 Pure hyperglyceridemia: Secondary | ICD-10-CM | POA: Diagnosis not present

## 2023-01-26 DIAGNOSIS — N183 Chronic kidney disease, stage 3 unspecified: Secondary | ICD-10-CM | POA: Diagnosis not present

## 2023-01-27 DIAGNOSIS — Z419 Encounter for procedure for purposes other than remedying health state, unspecified: Secondary | ICD-10-CM | POA: Diagnosis not present

## 2023-01-28 ENCOUNTER — Emergency Department (HOSPITAL_BASED_OUTPATIENT_CLINIC_OR_DEPARTMENT_OTHER)
Admission: EM | Admit: 2023-01-28 | Discharge: 2023-01-28 | Disposition: A | Discharge status: Home / Self Care | Payer: Medicaid Other | Attending: Emergency Medicine | Admitting: Emergency Medicine

## 2023-01-28 ENCOUNTER — Other Ambulatory Visit: Payer: Self-pay

## 2023-01-28 DIAGNOSIS — I509 Heart failure, unspecified: Secondary | ICD-10-CM | POA: Insufficient documentation

## 2023-01-28 DIAGNOSIS — Z79899 Other long term (current) drug therapy: Secondary | ICD-10-CM | POA: Diagnosis not present

## 2023-01-28 DIAGNOSIS — E119 Type 2 diabetes mellitus without complications: Secondary | ICD-10-CM | POA: Insufficient documentation

## 2023-01-28 DIAGNOSIS — Z7982 Long term (current) use of aspirin: Secondary | ICD-10-CM | POA: Diagnosis not present

## 2023-01-28 DIAGNOSIS — L02416 Cutaneous abscess of left lower limb: Secondary | ICD-10-CM | POA: Diagnosis not present

## 2023-01-28 DIAGNOSIS — E039 Hypothyroidism, unspecified: Secondary | ICD-10-CM | POA: Insufficient documentation

## 2023-01-28 DIAGNOSIS — Z794 Long term (current) use of insulin: Secondary | ICD-10-CM | POA: Insufficient documentation

## 2023-01-28 DIAGNOSIS — L0291 Cutaneous abscess, unspecified: Secondary | ICD-10-CM

## 2023-01-28 LAB — PREGNANCY, URINE: Preg Test, Ur: NEGATIVE

## 2023-01-28 MED ORDER — DOXYCYCLINE HYCLATE 100 MG PO TABS
100.0000 mg | ORAL_TABLET | Freq: Once | ORAL | Status: AC
  Administered 2023-01-28: 100 mg via ORAL
  Filled 2023-01-28: qty 1

## 2023-01-28 MED ORDER — LIDOCAINE-EPINEPHRINE (PF) 2 %-1:200000 IJ SOLN
10.0000 mL | Freq: Once | INTRAMUSCULAR | Status: AC
  Administered 2023-01-28: 10 mL via INTRADERMAL
  Filled 2023-01-28: qty 20

## 2023-01-28 MED ORDER — DOXYCYCLINE HYCLATE 100 MG PO CAPS: 100.0000 mg | ORAL_CAPSULE | Freq: Two times a day (BID) | ORAL | 0 refills | Status: AC

## 2023-01-28 NOTE — ED Triage Notes (Signed)
Pt caox4, ambulatory c/o "an infection" in L inner thigh stating she gets recurring skin infections. Pt further reports she completed course of abx (Augmentin) which she was on for UTI but that her PCP told her it should cover the other infection as well, but that it has gotten more painful and doesn't feel like it is getting better. Afebrile.

## 2023-01-28 NOTE — Discharge Instructions (Addendum)
We saw you in the ER for the abscess. We drained the abscess, and have packed the wound. Keep the packing in there for 2 days, and see a primary care doctor or return to the ER for the packing removal and wound recheck.  Please take the antibiotics I have prescribed to help decrease the chance of infection.  Keep the wound clean and dry otherwise, and allow for any drainage to occur. Please follow up with primary care provider to be rechecked in the next week to ensure you are healing properly. If symptoms change or worsen, please return to ER.

## 2023-01-28 NOTE — ED Provider Notes (Signed)
[place] EMERGENCY DEPARTMENT AT [facility] Provider Note   CSN: [id number] Arrival date & time: 01/28/23  1621     History  No chief complaint on file.   Jennifer [name] is a 32 y.o. female history of type 2 diabetes, hypothyroidism, CHF, nonischemic cardiomyopathy, HHS, PCOS, MI presented with left groin abscess that is present for the past 4 weeks.  Patient was recently admitted for chest pain and states this was not treated.  Patient denies any fevers, nausea vomiting, discharge but notes that becomes irritated when it rubs against her other thighs as it is on the inside of her left thigh on the upper portion.  Patient denies any change in sensation/motor skills but notes that it is red in the area and would like to have it drained.  Denies history of HS  Home Medications Prior to Admission medications   Medication Sig Start Date End Date Taking? Authorizing Provider  doxycycline (VIBRAMYCIN) 100 MG capsule Take 1 capsule (100 mg total) by mouth 2 (two) times daily for 7 days. 01/28/23 02/04/23 Yes Danijela Vessey, Beverly Gust, PA-C  amitriptyline (ELAVIL) 10 MG tablet Take 10 mg by mouth at bedtime.    [provider]  aspirin EC 81 MG EC tablet Take 1 tablet (81 mg total) by mouth daily. Swallow whole. 12/07/20   Pennie Banter, DO  butalbital-acetaminophen-caffeine (FIORICET) [phone] MG tablet Take 1 tablet by mouth daily as needed for headache. 09/26/22   [provider]  carvedilol (COREG) 3.125 MG tablet Take 1 tablet (3.125 mg total) by mouth 2 (two) times daily. Patient not taking: Reported on 01/12/2023 10/04/22   [name], [name], MD  CREON [phone] units CPEP capsule Take 36,000 Units by mouth 3 (three) times daily.    [provider]  dicyclomine (BENTYL) 20 MG tablet Take 1 tablet (20 mg total) by mouth 2 (two) times daily. Patient taking differently: Take 20 mg by mouth as needed for spasms. 09/15/22   [name], [name], MD  DULoxetine  (CYMBALTA) 60 MG capsule Take 60 mg by mouth at bedtime. 09/26/22   [provider]  ergocalciferol (VITAMIN D2) 1.25 MG (50000 UT) capsule Take 50,000 Units by mouth every 7 (seven) days. Every Sunday    [provider]  Evolocumab (REPATHA) 140 MG/ML SOSY Inject 140 mg into the skin See admin instructions. Inject 140 mg subcutaneously every other Thursday evening    [provider]  fenofibrate 54 MG tablet Take 1 tablet (54 mg total) by mouth daily. 12/01/22   [name], [name], MD  hydrALAZINE (APRESOLINE) 50 MG tablet Take 50 mg by mouth 3 (three) times daily. 11/16/22   [provider]  insulin lispro (HUMALOG) 100 UNIT/ML injection Inject 20 Units into the skin 3 (three) times daily before meals.    [provider]  Insulin Pen Needle 32G X 4 MM MISC 1 Device by Does not apply route QID. For use with insulin pens 02/13/22   [name], MD  insulin regular human CONCENTRATED (HUMULIN R U-500 KWIKPEN) 500 UNIT/ML KwikPen Inject 185 Units into the skin 3 (three) times daily with meals. Patient taking differently: Inject 195 Units into the skin 3 (three) times daily with meals. 09/20/22 01/09/24  [name], MD  ivabradine (CORLANOR) 7.5 MG TABS tablet Take 1 tablet (7.5 mg total) by mouth 2 (two) times daily with a meal. Patient not taking: Reported on 01/09/2023 08/11/22   [name], MD  levocetirizine (XYZAL) 5 MG tablet  Take 5 mg by mouth daily. 03/05/22   [provider]  levothyroxine (SYNTHROID) 137 MCG tablet Take 137 mcg by mouth daily. 09/20/22   [provider]  pantoprazole (PROTONIX) 40 MG tablet Take 40 mg by mouth daily. 12/24/19   [provider]  pregabalin (LYRICA) 300 MG capsule Take 300 mg by mouth at bedtime.    [provider]  prochlorperazine (COMPAZINE) 5 MG tablet Take 5 mg by mouth at bedtime.    [provider]  rosuvastatin (CRESTOR) 40 MG tablet Take 40 mg by mouth  daily. 08/09/21   [provider]  sodium bicarbonate 650 MG tablet Take 650 mg by mouth 2 (two) times daily.    [provider]  topiramate (TOPAMAX) 50 MG tablet Take 50 mg by mouth 2 (two) times daily.    [provider]  torsemide (DEMADEX) 20 MG tablet Take 2 tablets (40 mg total) by mouth daily. May take 1 additional tablet as needed 10/05/22   [name], [name], MD  tretinoin (RETIN-A) 0.05 % cream Apply 1 application topically at bedtime. 01/17/21   [provider]  VASCEPA 1 g capsule Take 2 capsules (2 g total) by mouth 2 (two) times daily. 12/01/22   [name], [name], MD      Allergies    Ketamine, [place], Morphine, Penicillins, Clarithromycin, Erythromycin, Fentanyl, Penicillin v, Shellfish allergy, and Prednisone    Review of Systems   Review of Systems  Physical Exam Updated Vital Signs BP 138/79   Pulse 95   Temp 98.4 F (36.9 C)   Resp 18   Ht 4\' 9"  (1.448 m)   Wt 61.2 kg   LMP 01/15/2023 (Exact Date)   SpO2 98%   BMI 29.21 kg/m  Physical Exam Constitutional:      General: [name] not in acute distress. Cardiovascular:     Rate and Rhythm: Normal rate and regular rhythm.     Pulses: Normal pulses.     Heart sounds: Normal heart sounds.  Pulmonary:     Effort: Pulmonary effort is normal. No respiratory distress.     Breath sounds: Normal breath sounds.  Skin:    General: Skin is warm and dry.          Comments: Erythema with areas of fluctuance noted on the left medial upper thigh that is tender to palpation, unable to express any purulent fluid or any discharge  Neurological:     Mental Status: [name] alert and oriented to person, place, and time.  Psychiatric:        Mood and Affect: Mood normal.     ED Results / Procedures / Treatments   Labs (all labs ordered are listed, but only abnormal results are displayed) Labs Reviewed  PREGNANCY, URINE    EKG None  Radiology No results  found.  Procedures Ultrasound ED Soft Tissue  Date/Time: 01/28/2023 5:29 PM  Performed by: Netta Corrigan, PA-C Authorized by: Netta Corrigan, PA-C   Procedure details:    Indications: localization of abscess     Transverse view:  Visualized   Longitudinal view:  Visualized   Images: archived   Location:    Location: groin     Side:  Left Findings:     abscess present    no cellulitis present    no foreign body present .Marland KitchenIncision and Drainage  Date/Time: 01/28/2023 6:10 PM  Performed by: Netta Corrigan, PA-C Authorized by: Netta Corrigan, PA-C   Consent:  Consent obtained:  Verbal   Consent given by:  Patient   Risks, benefits, and alternatives were discussed: yes     Risks discussed:  Bleeding, damage to other organs, incomplete drainage, infection and pain   Alternatives discussed:  No treatment Universal protocol:    Procedure explained and questions answered to patient or proxy's satisfaction: yes     Imaging studies available: yes     Patient identity confirmed:  Verbally with patient Location:    Type:  Abscess   Size:  2.0   Location:  Lower extremity   Lower extremity location: Left medial thigh. Pre-procedure details:    Skin preparation:  Povidone-iodine Sedation:    Sedation type:  None Anesthesia:    Anesthesia method:  Local infiltration   Local anesthetic:  Lidocaine 2% WITH epi Procedure type:    Complexity:  Complex Procedure details:    Incision types:  Single straight   Incision depth:  Dermal   Wound management:  Probed and deloculated and irrigated with saline   Drainage:  Bloody and purulent   Drainage amount:  Moderate   Wound treatment:  Wound left open   Packing materials:  1/2 in iodoform gauze   Amount 1/2" iodoform:  2 inches Post-procedure details:    Procedure completion:  Tolerated     Medications Ordered in ED Medications  doxycycline (VIBRA-TABS) tablet 100 mg (has no administration in time range)   lidocaine-EPINEPHrine (XYLOCAINE W/EPI) 2 %-1:200000 (PF) injection 10 mL (10 mLs Intradermal Given 01/28/23 1739)    ED Course/ Medical Decision Making/ A&P                                 Medical Decision Making Amount and/or Complexity of Data Reviewed Labs: ordered.  Risk Prescription drug management.   Jennifer [name] 32 y.o. presented today for left leg abscess. Working DDx that I considered at this time includes, but not limited to, abscess, cellulitis, lymphadenopathy, sepsis.  R/o DDx: cellulitis, lymphadenopathy, sepsis: These are considered less likely due to history of present illness, physical exam, labs/imaging findings  Review of prior external notes: 01/20/2023 discharge summary  Unique Tests and My Interpretation:  Soft tissue ultrasound: Superficial abscess noted Urine pregnancy: Negative  Social Determinants of Health: none  Discussion with Independent Historian: None  Discussion of Management of Tests: None  Risk: Medium: prescription drug management  Risk Stratification Score: None  Plan: On exam patient was in no acute distress with stable vitals.  Patient notes that she has had this abscess on her left inner thigh for the past 4 weeks without any discharge but states that hurts to move her anything touches it.  Bedside ultrasound does confirm abscess with pocket of what appears to be purulent material.  I spoke to the patient mother present cons of doing an incision and drainage and patient full decision made capacity and understands the risks and states that she still wants to proceed with incision and drainage.  Will get the lidocaine and attempt to drain patient's abscess.  Patient is not endorsing systemic symptoms that would indicate further workup at this time.  Incision drainage was performed and did yield bloody purulent material.  Fluctuance and erythema did improve afterwards as well and patient stated she felt a little bit better.  Packing  was placed and informed patient that she will need to return in 48 hours to have the wound checked out the packing  taken out and spoke to her about signs symptoms of infection that could be associated with this abscess as it is left open.  Currently awaiting urine pregnancy so that I can prescribe the patient doxycycline and have her follow-up with her primary care provider as well.  Urine pregnancy was negative.  Will give 1 dose of doxycycline here and prescribed the rest and encouraged her to return in 40 hours for the wound check and to follow-up with her primary care provider as well.  Patient was given return precautions. Patient stable for discharge at this time.  Patient verbalized understanding of plan.  This chart was dictated using voice recognition software.  Despite best efforts to proofread,  errors can occur which can change the documentation meaning.        Final Clinical Impression(s) / ED Diagnoses Final diagnoses:  Abscess    Rx / DC Orders ED Discharge Orders          Ordered    doxycycline (VIBRAMYCIN) 100 MG capsule  2 times daily        01/28/23 1836              Remi Deter 01/28/23 1839    Benjiman Core, MD 01/28/23 2205

## 2023-01-30 DIAGNOSIS — E139 Other specified diabetes mellitus without complications: Secondary | ICD-10-CM | POA: Diagnosis not present

## 2023-01-30 DIAGNOSIS — R7989 Other specified abnormal findings of blood chemistry: Secondary | ICD-10-CM | POA: Diagnosis not present

## 2023-01-30 DIAGNOSIS — N183 Chronic kidney disease, stage 3 unspecified: Secondary | ICD-10-CM | POA: Diagnosis not present

## 2023-01-30 DIAGNOSIS — E039 Hypothyroidism, unspecified: Secondary | ICD-10-CM | POA: Diagnosis not present

## 2023-01-30 DIAGNOSIS — E781 Pure hyperglyceridemia: Secondary | ICD-10-CM | POA: Diagnosis not present

## 2023-01-30 DIAGNOSIS — R635 Abnormal weight gain: Secondary | ICD-10-CM | POA: Diagnosis not present

## 2023-01-31 ENCOUNTER — Encounter (HOSPITAL_COMMUNITY): Payer: Self-pay

## 2023-01-31 ENCOUNTER — Ambulatory Visit (HOSPITAL_COMMUNITY)
Admission: RE | Admit: 2023-01-31 | Discharge: 2023-01-31 | Disposition: A | Discharge status: Home / Self Care | Payer: Medicaid Other | Source: Ambulatory Visit | Attending: Family Medicine | Admitting: Family Medicine

## 2023-01-31 VITALS — BP 154/80 | HR 99 | Wt 137.2 lb

## 2023-01-31 DIAGNOSIS — Z794 Long term (current) use of insulin: Secondary | ICD-10-CM | POA: Diagnosis not present

## 2023-01-31 DIAGNOSIS — Z91148 Patient's other noncompliance with medication regimen for other reason: Secondary | ICD-10-CM | POA: Diagnosis not present

## 2023-01-31 DIAGNOSIS — I428 Other cardiomyopathies: Secondary | ICD-10-CM | POA: Insufficient documentation

## 2023-01-31 DIAGNOSIS — I5042 Chronic combined systolic (congestive) and diastolic (congestive) heart failure: Secondary | ICD-10-CM

## 2023-01-31 DIAGNOSIS — I130 Hypertensive heart and chronic kidney disease with heart failure and stage 1 through stage 4 chronic kidney disease, or unspecified chronic kidney disease: Secondary | ICD-10-CM | POA: Insufficient documentation

## 2023-01-31 DIAGNOSIS — I5022 Chronic systolic (congestive) heart failure: Secondary | ICD-10-CM | POA: Diagnosis not present

## 2023-01-31 DIAGNOSIS — E138 Other specified diabetes mellitus with unspecified complications: Secondary | ICD-10-CM

## 2023-01-31 DIAGNOSIS — R1900 Intra-abdominal and pelvic swelling, mass and lump, unspecified site: Secondary | ICD-10-CM | POA: Diagnosis not present

## 2023-01-31 DIAGNOSIS — Z7689 Persons encountering health services in other specified circumstances: Secondary | ICD-10-CM | POA: Diagnosis not present

## 2023-01-31 DIAGNOSIS — K861 Other chronic pancreatitis: Secondary | ICD-10-CM | POA: Insufficient documentation

## 2023-01-31 DIAGNOSIS — E781 Pure hyperglyceridemia: Secondary | ICD-10-CM | POA: Diagnosis not present

## 2023-01-31 DIAGNOSIS — I361 Nonrheumatic tricuspid (valve) insufficiency: Secondary | ICD-10-CM | POA: Insufficient documentation

## 2023-01-31 DIAGNOSIS — L0291 Cutaneous abscess, unspecified: Secondary | ICD-10-CM | POA: Diagnosis not present

## 2023-01-31 DIAGNOSIS — I272 Pulmonary hypertension, unspecified: Secondary | ICD-10-CM | POA: Insufficient documentation

## 2023-01-31 DIAGNOSIS — I2510 Atherosclerotic heart disease of native coronary artery without angina pectoris: Secondary | ICD-10-CM | POA: Diagnosis not present

## 2023-01-31 DIAGNOSIS — T50916A Underdosing of multiple unspecified drugs, medicaments and biological substances, initial encounter: Secondary | ICD-10-CM | POA: Insufficient documentation

## 2023-01-31 DIAGNOSIS — R140 Abdominal distension (gaseous): Secondary | ICD-10-CM | POA: Diagnosis not present

## 2023-01-31 DIAGNOSIS — N184 Chronic kidney disease, stage 4 (severe): Secondary | ICD-10-CM | POA: Insufficient documentation

## 2023-01-31 DIAGNOSIS — E0822 Diabetes mellitus due to underlying condition with diabetic chronic kidney disease: Secondary | ICD-10-CM | POA: Diagnosis not present

## 2023-01-31 DIAGNOSIS — Q228 Other congenital malformations of tricuspid valve: Secondary | ICD-10-CM

## 2023-01-31 DIAGNOSIS — G4733 Obstructive sleep apnea (adult) (pediatric): Secondary | ICD-10-CM | POA: Diagnosis not present

## 2023-01-31 MED ORDER — IVABRADINE HCL 7.5 MG PO TABS: 7.5000 mg | ORAL_TABLET | Freq: Two times a day (BID) | ORAL | 6 refills | Status: DC

## 2023-01-31 MED ORDER — HYDRALAZINE HCL 25 MG PO TABS: 37.5000 mg | ORAL_TABLET | Freq: Three times a day (TID) | ORAL | 6 refills | Status: DC

## 2023-01-31 NOTE — Patient Instructions (Addendum)
Thank you for coming in today  If you had labs drawn today, any labs that are abnormal the clinic will call you No news is good news  Medications: Increase Hydralazine to 37.5 mg 1 1/2 tablets three times a day for your blood pressure Restart Ivabradine 7.5 mg twice daily (will not decrease blood pressure)  USE YOUR CARDIOMEMS  You have been referred to sleep medicine, their office will call you for further appointment details    Follow up appointments:  Your physician recommends that you schedule a follow-up appointment in:  4 months With Dr. Gala Romney You will receive a reminder letter in the mail a few months in advance. If you don't receive a letter, please call our office to schedule the follow-up appointment.    Do the following things EVERYDAY: Weigh yourself in the morning before breakfast. Write it down and keep it in a log. Take your medicines as prescribed Eat low salt foods--Limit salt (sodium) to 2000 mg per day.  Stay as active as you can everyday Limit all fluids for the day to less than 2 liters   At the [facility], you and your health needs are our priority. As part of our continuing mission to provide you with exceptional heart care, we have created designated Provider Care Teams. These Care Teams include your primary Cardiologist (physician) and Advanced Practice Providers (APPs- Physician Assistants and Nurse Practitioners) who all work together to provide you with the care you need, when you need it.   You may see any of the following providers on your designated Care Team at your next follow up: Dr Arvilla Meres Dr Marca Ancona Dr. Marcos Eke, NP Robbie Lis, [place] [facility] [place], [place] Brynda Peon, NP Karle Plumber, PharmD   Please be sure to bring in all your medications bottles to every appointment.    Thank you for choosing [facility]  If you have any  questions or concerns before your next appointment please send [place] a message through [place] or call our office at [phone].    TO LEAVE A MESSAGE FOR THE NURSE SELECT OPTION 2, PLEASE LEAVE A MESSAGE INCLUDING: YOUR NAME DATE OF BIRTH CALL BACK NUMBER REASON FOR CALL**this is important as we prioritize the call backs  YOU WILL RECEIVE A CALL BACK THE SAME DAY AS LONG AS YOU CALL BEFORE 4:00 PM

## 2023-01-31 NOTE — Progress Notes (Signed)
Advanced Heart Failure Clinic Note   PCP: Nathaneil Canary, PA-C HF Cardiologist: Dr. 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   Has been followed by Dr. Jacinto Halim for her HF.    Admitted 11/22/20 with ab pain and diarrhea. Ab CT concerning for vasculitis (no pancreatitis). Her TG were found to be 4030. She was started on an insulin/dextrose infusion. Echo 11/28/20 EF 20-25% with moderate RV dysfunction and mild septal flattening. Started on DBA 2.5. cMRI c/w prior myocarditis vs sarcoid (see below). Mod pericardial effusion also noted. LVEF 29%. RV normal. She underwent R/LHC showing single vessel RCA occlusion, RA 1, LVEDP 17, Fick CO 5.6/CI 3.88. She was able to be weaned off inotropes and GDMT titrated.     Post hospital follow up, she was drinking >2L/day fluid and eating more salty foods, volume was up, ReDs 43%, She followed up with Dr. [place] Siren with [facility] 12/17/20 for a 2nd opinion. He placed LifeVest and switched beta blocker to Toprol XL.   Echo (10/22): EF up to 45-50%, grade II DD, RV ok. LifeVest off.   Treated in ED 10/22 for pancreatitis, then admitted 11/22 for hyperglycemia, did not meet criteria for DKA. Hospitalization c/b hyponatremia, sodium 118. Concern for volume overload. Lasix restarted but SCr increased. Nephrology consulted and restarted gentle IVF and GDMT held for a couple of days.    Echo 12/22 EF 45%, RV normal.    Follow up 08/02/21, weight up 16 lbs, ReDs 27%. Arranged for RHC to better assess hemodynamics and see if abdominal symptoms related to right-sided HF due to high-output state or non-cardiac (see results below).    RHC 5/23 with elevated filling pressures and normal cardiac ouput. Mild to moderate mixed pulmonary hypertension with  prominent v-waves in RA, suggestive of significant TR. Given IV lasix and metolazone 2.5 added weekly to diuretic regimen.    RA = 12 prominent V waves, RV = 47/14, PA = 48/25 (37), PCW = 25, Fick cardiac output/index = 4.4/2.8, PVR =3.7 WU, Ao sat = 99%, PA sat = 62%, 62%   Post cath follow up, volume up, REDs 43%. Torsemide increased and weekly metolazone added.   Admitted 5/23 with AKI and hyperkalemia. Given IVF, insulin, calcium gluconate and Lokelma. Echo showed EF 45-50%, RV normal, mild-mod MR, RVSP 30. Only mild TR. Restarted on torsemide 40 bid (lower dose) and Entresto 49/51 bid. Arranged paramedicine, discharged home 143 lbs.   Seen 09/27/21 at [facility] for 2nd opinion, felt to be congested and recommended increasing torsemide to 60 mg bid and instructed f/u in 3 months.   Received Furoscix 8/4, 8/5, and 10/31/21.  Admitted 9/23 for volume overload in setting of RV failure and AKI. Required milrinone. Underwent Cardiomems implant  RHC 9/23  RA 15, PA 37/23 (28), PCWP 16, Fick CO/CI 4.2/2.6, Thermo CO/CI 3.7/2.3, PVR 4.3, PA sat 51%, 54%, PAPi 0.93  Follows with Dr. Lindie Spruce at [facility]. Had renal biopsy recently (12/31/21) and showed DM and HTN nephropathy. They discussed starting HD soon.   Has been followed by Paramedicine. Continues to have multiple hospitalizations for hyperglycemia, hyperTG (>5,000).   Last admit in June TG > 5K. Treated with insulin drip. Follows with Dr. Doneta Public at [facility] (Endo). Last A1c 7.5. Using Dexcom. Said fluid feels ok. Taking torsemide 60 daily.  Here with Paramedicine. They have not connected wit her much due to social issues at home. Compliant with meds. Feels she is on too much BP meds. Says SBP in 80s. Stopped hydralazine 50 tid about 10 days ago   Echo 10/04/22 EF 55% RV ok. Mild AI   Seen in ED 6 x in 3 months with various complaints. Admitted 10/24 with CP, AKI and hyperkalemia. Cards consulted, unlikely ACS and no inpatient ischemic work up initiated.  Required insulin gtt, TGs > 5k without acute pancreatitis. SCr peaked 2.11>>1.75 on discharge.  Today she returns for HF follow up. Overall feeling fine. She has SOB walking short distances on flat ground. Has daytime sleepiness, wakes feeling un-rested. She snores, requesting referral to sleep medicine. She has CP when she takes hydralazine, she is unsure if she is taking her hydralazine correctly. She has lightheadedness. Abdomen is swelling. Denies abnormal bleeding, or PND/Orthopnea. Appetite ok. No fever or chills. Weight at home 135 pounds. She never restarted ivabradine after recent admission. Home BP has been 150s/90s.  Labs reviewed:  3/24: BNP 70.1, K 4.3, SCr 1.53 01/19/23: SCr 1.75, K 4.4  ROS: All systems reviewed and negative except as per HPI.   Past Medical History:  Diagnosis Date   Afib (HCC) 05/12/2021   Brain tumor (HCC) 03/29/1995   astrocytoma   CHF (congestive heart failure) (HCC)    Cholesterosis    CKD (chronic kidney disease) stage 4, GFR 15-29 ml/min (HCC) 05/13/2021   DM (diabetes mellitus) (HCC) 10/10/2018   Fatty liver    HTN (hypertension) 10/10/2018   Hypothyroidism 10/10/2018   Lipoprotein deficiency    Lung disease    longevity long term   Pancreatitis    Polycystic ovary syndrome    Current Outpatient Medications  Medication Sig Dispense Refill   amitriptyline (ELAVIL) 10 MG tablet Take 10 mg by mouth at bedtime.     aspirin EC 81 MG EC tablet Take 1 tablet (81 mg total) by mouth daily. Swallow whole. 30 tablet 1   butalbital-acetaminophen-caffeine (FIORICET) 50-325-40 MG tablet Take 1 tablet by mouth daily as needed for headache.     carvedilol (COREG) 3.125 MG tablet Take 1 tablet (3.125 mg total) by mouth 2 (two) times daily. 60 tablet 11   CREON 36000-114000 units CPEP capsule Take 36,000 Units by mouth 3 (three) times daily.     dicyclomine (BENTYL) 20 MG tablet Take 1 tablet (20 mg total) by mouth 2 (two) times daily. (Patient taking  differently: Take 20 mg by mouth as needed for spasms.) 20 tablet 0   doxycycline (VIBRAMYCIN) 100 MG capsule Take 1 capsule (100 mg total) by mouth 2 (two) times daily for 7 days. 14 capsule 0   DULoxetine (CYMBALTA) 60 MG capsule Take 60 mg by mouth at bedtime.     ergocalciferol (VITAMIN D2) 1.25 MG (50000 UT) capsule Take 50,000 Units by mouth every 7 (seven) days. Every Sunday     Evolocumab (REPATHA) 140 MG/ML SOSY Inject 140 mg into the skin See admin instructions. Inject 140 mg subcutaneously every other Wednesday evening     fenofibrate 54 MG tablet Take 1 tablet (54 mg total) by mouth daily. 30 tablet 11   hydrALAZINE (APRESOLINE) 50 MG tablet Take 25 mg by mouth 3 (three) times daily.     insulin lispro (HUMALOG) 100 UNIT/ML injection Inject 30-35 Units into the skin 3 (three) times daily before meals.     Insulin Pen Needle 32G X 4 MM MISC 1  Device by Does not apply route QID. For use with insulin pens 100 each 0   insulin regular human CONCENTRATED (HUMULIN R U-500 KWIKPEN) 500 UNIT/ML KwikPen Inject 185 Units into the skin 3 (three) times daily with meals. (Patient taking differently: Inject 195 Units into the skin 3 (three) times daily with meals.) 33.3 mL 0   levocetirizine (XYZAL) 5 MG tablet Take 5 mg by mouth daily.     levothyroxine (SYNTHROID) 137 MCG tablet Take 137 mcg by mouth daily.     pantoprazole (PROTONIX) 40 MG tablet Take 40 mg by mouth daily.     pregabalin (LYRICA) 300 MG capsule Take 300 mg by mouth at bedtime.     prochlorperazine (COMPAZINE) 5 MG tablet Take 5 mg by mouth at bedtime.     rosuvastatin (CRESTOR) 40 MG tablet Take 40 mg by mouth daily.     sodium bicarbonate 650 MG tablet Take 650 mg by mouth 2 (two) times daily.     topiramate (TOPAMAX) 50 MG tablet Take 50 mg by mouth 2 (two) times daily.     torsemide (DEMADEX) 20 MG tablet Take 2 tablets (40 mg total) by mouth daily. May take 1 additional tablet as needed 90 tablet 2   tretinoin (RETIN-A) 0.05  % cream Apply 1 application topically at bedtime.     ivabradine (CORLANOR) 7.5 MG TABS tablet Take 1 tablet (7.5 mg total) by mouth 2 (two) times daily with a meal. (Patient not taking: Reported on 01/09/2023) 60 tablet 6   VASCEPA 1 g capsule Take 2 capsules (2 g total) by mouth 2 (two) times daily. (Patient not taking: Reported on 01/31/2023) 120 capsule 11   No current facility-administered medications for this encounter.   Allergies  Allergen Reactions   Ketamine Other (See Comments)    In vegetative state for 15 minutes per pt   [place] Itching    Itchy throat  Itchy throat Itchy throat Itchy throat Itchy throat    Itchy throat  Itchy throat Itchy throat Itchy throat Itchy throat    Itchy throat   Morphine Itching, Rash and Hives    Sorethroat   Penicillins Hives, Itching and Rash    Has patient had a PCN reaction causing immediate rash, facial/tongue/throat swelling, SOB or lightheadedness with hypotension: Y Has patient had a PCN reaction causing severe rash involving mucus membranes or skin necrosis: Y Has patient had a PCN reaction that required hospitalization: N Has patient had a PCN reaction occurring within the last 10 years: Y   Clarithromycin Other (See Comments)    Stomach pain   Erythromycin     Stomach pain   Fentanyl Nausea And Vomiting   Penicillin V Hives, Itching and Other (See Comments)    Gi upset, also   Shellfish Allergy Other (See Comments)    Pt has never had shellfish but tested positive on allergy test. Pt states contrast in CT is okay   Prednisone Rash   Social History   Socioeconomic History   Marital status: Divorced    Spouse name: Not on file   Number of children: 0   Years of education: Not on file   Highest education level: Not on file  Occupational History   Occupation: [employer]  Tobacco Use   Smoking status: Never   Smokeless tobacco: Never  Vaping Use   Vaping status: Never Used  Substance and Sexual Activity   Alcohol  use: Never   Drug use: Never   Sexual activity:  Yes  Other Topics Concern   Not on file  Social History Narrative   Not on file   Social Determinants of Health   Financial Resource Strain: Low Risk  (09/01/2022)   Received from [facility], [facility]   Overall Financial Resource Strain (CARDIA)    Difficulty of Paying Living Expenses: Not hard at all  Food Insecurity: No Food Insecurity (01/18/2023)   Hunger Vital Sign    Worried About Running Out of Food in the Last Year: Never true    Ran Out of Food in the Last Year: Never true  Transportation Needs: No Transportation Needs (01/18/2023)   PRAPARE - Administrator, Civil Service (Medical): No    Lack of Transportation (Non-Medical): No  Physical Activity: Inactive (12/01/2022)   Exercise Vital Sign    Days of Exercise per Week: 0 days    Minutes of Exercise per Session: 0 min  Stress: No Stress Concern Present (09/01/2022)   Received from [facility], [facility] - Occupational Stress Questionnaire    Feeling of Stress : Not at all  Social Connections: Socially Integrated (09/01/2022)   Received from [facility], [facility]   Social Network    How would you rate your social network (family, work, friends)?: Good participation with social networks  Intimate Partner Violence: Not At Risk (01/18/2023)   Humiliation, Afraid, Rape, and Kick questionnaire    Fear of Current or Ex-Partner: No    Emotionally Abused: No    Physically Abused: No    Sexually Abused: No   Family History  Problem Relation Age of Onset   Diabetes Mother    Hypertension Mother    Hyperlipidemia Mother    Thyroid disease Mother    Hypertension Father    Diabetes Father    Pancreatic cancer Paternal Aunt    Pancreatic cancer Paternal Uncle    Colon cancer Neg Hx    Esophageal cancer Neg Hx    Stomach cancer Neg Hx    BP (!) 154/80   Pulse 99   Wt 62.2 kg (137 lb 3.2 oz)   LMP  01/15/2023 (Exact Date)   SpO2 98%   BMI 29.69 kg/m   Wt Readings from Last 3 Encounters:  01/31/23 62.2 kg (137 lb 3.2 oz)  01/28/23 61.2 kg (135 lb)  01/20/23 61.6 kg (135 lb 12.9 oz)   PHYSICAL EXAM: General:  NAD. No resp difficulty, walked into clinic HEENT: L eye ptosis Neck: Supple. No JVD. Carotids 2+ bilat; no bruits. No lymphadenopathy or thryomegaly appreciated. Cor: PMI nondisplaced. Tachy rate & rhythm. No rubs, gallops or murmurs. Lungs: Clear Abdomen: Soft, nontender, + distended. No hepatosplenomegaly. No bruits or masses. Good bowel sounds. Extremities: No cyanosis, clubbing, rash, edema Neuro: Alert & oriented x 3, cranial nerves grossly intact. Moves all 4 extremities w/o difficulty; L arm weakness Affect pleasant.  Cardiomems (personally reviewed): no recent readings.  ECG (personally reviewed): ST 101 bpm  ASSESSMENT & PLAN: Nonischemic cardiomyopathy, recovered EF, previously with HFrEF (EF 29%) - Echo (5/23): EF 45-50%, RV normal  - Echo 9/23: EF 50-55%, RV normal. mild MR, mild-mod TR - Echo 3/24 EF 45-50% mild-mod TR, moderate MR - Echo (06/10/22) EF 30-35% (lower than past), RV mildly dilated, mod-sev MR, sev TR. - Echo today 10/04/22 EF 55% RV ok. Mild AI  - EF improved and volume looks good.  - NYHA II-III, volume looks ok - Restart ivabradine 7.5 mg  bid. HR 101 today - Increase hydralazine to 37.5 mg tid with elevated BP - Continue carvedilol 3.125 mg bid (low BP and dizzy at higher doses) - Continue torsemide 40 mg daily - Continue spiro 25 mg daily. - Off Jardiance due to frequent GU infections (currently treating a UTI) - Off ARB/ARNI with AKI. Consider re-challenge next visit - Recent labs reviewed and are stable; K 4.4, SCr 1.75 - I asked her to use her Cardiomems.   2. CAD - Single vessel RCA occlusion (small co-dominant vessel) on cardiac cath 09/08. - Medical management - No s/s angina - Continue aspirin + statin + Repatha  3.  CKD  IV - Frequent AKIs - Scr baseline variable with multiple AKIs, typically 2.0-2.5 - Working with Dr. Lindie Spruce (Nephrology at [facility]). Had renal biopsy (12/31/21) and showed DM and HTN nephropathy. Discussing timing of HD start. Has started HD education - Most recent Scr 1.75 - discussed having nephrologist in [place], I asked her to discuss with her nephrologist at [facility] about referral to [facility].   4. Pulmonary hypertension - Mild to moderate on RHC 5/23. PVR 3.7 WU. - RHC 12/06/21: RA 15, PA 37/23 (28), PCWP 16, Fick CO/CI 4.2/2.6, Thermo CO/CI 3.7/2.3, PVR 4.3, PA sat 51%, 54%, PAPi 0.93  - VQ negative - She stopped sildenafil with dizziness and low BP   5. TR - Prominent v-waves in RA tracing on RHC 5/23. - Echo 5/23 shows only mild TR  - Mild to mod on 9/23 echo - moderate on echo (3/24) - TR on mild on echo 10/04/22   6. Severe hyperTG - Due to LPL deficiency w/ recent pancreatitis. - Continue fenofibrate  - Recent admission >5000 - CTA/P negative for pancreatitis or other acute findings  - [name] followed in [facility]   7. Type 3c DM - 2/2 chronic pancreatitis  - Follows w/ endocrinology at [facility]. - on insulin  8. Chronic abdominal swelling/bloating - Seen at [facility] (11/20) for IBS, per chart review. - Had penumatosis intestinalis. She was started on pancreatic enzyme replacement. - EGD (10/21): normal, bx taken to assess for celiac. - CT abdomen (2/22) and RUQ US showed no obvious gallbladder stones. - HIDA (4/22): ended early due to tachypnea, but cystic duct and gallbladder appeared WNL. - CT abdomen 12/22 unremarkable  - US abdomen 11/25/21: No ascites - suspect primarily fatty liver - Abd XR 12/27: mild gaseous distention  - Improved today  9. Poor Med Compliance/SDOH - Graduated from paramedicine 10/13/22. - Med compliance is questionable. Discuss re-referral next visit, if she is open to it.  I asked her to use her Cardiomems, she agrees to.  Follow up in 4 months with  Dr. Gala Romney  Jacklynn Ganong, FNP  10:39 AM  Advanced Heart Failure Clinic [facility] [address] [facility] [place] [address] [phone] (office) [phone] (fax)

## 2023-02-01 DIAGNOSIS — Z7689 Persons encountering health services in other specified circumstances: Secondary | ICD-10-CM | POA: Diagnosis not present

## 2023-02-01 DIAGNOSIS — N838 Other noninflammatory disorders of ovary, fallopian tube and broad ligament: Secondary | ICD-10-CM | POA: Diagnosis not present

## 2023-02-01 DIAGNOSIS — R162 Hepatomegaly with splenomegaly, not elsewhere classified: Secondary | ICD-10-CM | POA: Diagnosis not present

## 2023-02-14 DIAGNOSIS — G43019 Migraine without aura, intractable, without status migrainosus: Secondary | ICD-10-CM | POA: Diagnosis not present

## 2023-02-14 DIAGNOSIS — Z85841 Personal history of malignant neoplasm of brain: Secondary | ICD-10-CM | POA: Diagnosis not present

## 2023-02-14 DIAGNOSIS — Z7689 Persons encountering health services in other specified circumstances: Secondary | ICD-10-CM | POA: Diagnosis not present

## 2023-02-22 DIAGNOSIS — Z30011 Encounter for initial prescription of contraceptive pills: Secondary | ICD-10-CM | POA: Diagnosis not present

## 2023-02-22 DIAGNOSIS — E282 Polycystic ovarian syndrome: Secondary | ICD-10-CM | POA: Diagnosis not present

## 2023-02-22 DIAGNOSIS — Z7689 Persons encountering health services in other specified circumstances: Secondary | ICD-10-CM | POA: Diagnosis not present

## 2023-02-26 DIAGNOSIS — Z419 Encounter for procedure for purposes other than remedying health state, unspecified: Secondary | ICD-10-CM | POA: Diagnosis not present

## 2023-03-01 DIAGNOSIS — Z7689 Persons encountering health services in other specified circumstances: Secondary | ICD-10-CM | POA: Diagnosis not present

## 2023-03-03 ENCOUNTER — Encounter: Payer: Self-pay | Admitting: Podiatry

## 2023-03-03 ENCOUNTER — Ambulatory Visit (INDEPENDENT_AMBULATORY_CARE_PROVIDER_SITE_OTHER): Payer: Medicaid Other | Admitting: Podiatry

## 2023-03-03 VITALS — Ht <= 58 in | Wt 137.0 lb

## 2023-03-03 DIAGNOSIS — Z7689 Persons encountering health services in other specified circumstances: Secondary | ICD-10-CM | POA: Diagnosis not present

## 2023-03-03 DIAGNOSIS — D492 Neoplasm of unspecified behavior of bone, soft tissue, and skin: Secondary | ICD-10-CM | POA: Diagnosis not present

## 2023-03-03 NOTE — Progress Notes (Signed)
Subjective:  Patient ID: Jennifer [name], female    DOB: 04-06-1990,  MRN: [id number]  Chief Complaint  Patient presents with   Callouses    Patient is here for bilateral foot callouses    32 y.o. female presents with the above complaint.  Patient presents with right submet 2 and left submetatarsal 3 porokeratotic lesion painful to touch is progressive and worse worse with ambulation worse with pressure she has not seen MRIs prior to seeing me.  She would like to discuss treatment options she is a diabetic.  [name] worried that it may turn an ulceration.  She has not seen and was prior to seeing me pain scale 7 out of 10 dull achy in nature   Review of Systems: Negative except as noted in the HPI. Denies N/V/F/Ch.  Past Medical History:  Diagnosis Date   Afib (HCC) 05/12/2021   Brain tumor (HCC) 03/29/1995   astrocytoma   CHF (congestive heart failure) (HCC)    Cholesterosis    CKD (chronic kidney disease) stage 4, GFR 15-29 ml/min (HCC) 05/13/2021   DM (diabetes mellitus) (HCC) 10/10/2018   Fatty liver    HTN (hypertension) 10/10/2018   Hypothyroidism 10/10/2018   Lipoprotein deficiency    Lung disease    longevity long term   Pancreatitis    Polycystic ovary syndrome     Current Outpatient Medications:    amitriptyline (ELAVIL) 10 MG tablet, Take 10 mg by mouth at bedtime., Disp: , Rfl:    aspirin EC 81 MG EC tablet, Take 1 tablet (81 mg total) by mouth daily. Swallow whole., Disp: 30 tablet, Rfl: 1   butalbital-acetaminophen-caffeine (FIORICET) 50-325-40 MG tablet, Take 1 tablet by mouth daily as needed for headache., Disp: , Rfl:    carvedilol (COREG) 3.125 MG tablet, Take 1 tablet (3.125 mg total) by mouth 2 (two) times daily., Disp: 60 tablet, Rfl: 11   CREON 36000-114000 units CPEP capsule, Take 36,000 Units by mouth 3 (three) times daily., Disp: , Rfl:    dicyclomine (BENTYL) 20 MG tablet, Take 1 tablet (20 mg total) by mouth 2 (two) times daily. (Patient taking  differently: Take 20 mg by mouth as needed for spasms.), Disp: 20 tablet, Rfl: 0   DULoxetine (CYMBALTA) 60 MG capsule, Take 60 mg by mouth at bedtime., Disp: , Rfl:    ergocalciferol (VITAMIN D2) 1.25 MG (50000 UT) capsule, Take 50,000 Units by mouth every 7 (seven) days. Every Sunday, Disp: , Rfl:    Evolocumab (REPATHA) 140 MG/ML SOSY, Inject 140 mg into the skin See admin instructions. Inject 140 mg subcutaneously every other Wednesday evening, Disp: , Rfl:    fenofibrate 54 MG tablet, Take 1 tablet (54 mg total) by mouth daily., Disp: 30 tablet, Rfl: 11   hydrALAZINE (APRESOLINE) 25 MG tablet, Take 1.5 tablets (37.5 mg total) by mouth 3 (three) times daily., Disp: 135 tablet, Rfl: 6   insulin lispro (HUMALOG) 100 UNIT/ML injection, Inject 30-35 Units into the skin 3 (three) times daily before meals., Disp: , Rfl:    Insulin Pen Needle 32G X 4 MM MISC, 1 Device by Does not apply route QID. For use with insulin pens, Disp: 100 each, Rfl: 0   insulin regular human CONCENTRATED (HUMULIN R U-500 KWIKPEN) 500 UNIT/ML KwikPen, Inject 185 Units into the skin 3 (three) times daily with meals. (Patient taking differently: Inject 195 Units into the skin 3 (three) times daily with meals.), Disp: 33.3 mL, Rfl: 0   ivabradine (CORLANOR) 7.5 MG TABS  tablet, Take 1 tablet (7.5 mg total) by mouth 2 (two) times daily with a meal., Disp: 60 tablet, Rfl: 6   levocetirizine (XYZAL) 5 MG tablet, Take 5 mg by mouth daily., Disp: , Rfl:    levothyroxine (SYNTHROID) 137 MCG tablet, Take 137 mcg by mouth daily., Disp: , Rfl:    pantoprazole (PROTONIX) 40 MG tablet, Take 40 mg by mouth daily., Disp: , Rfl:    pregabalin (LYRICA) 300 MG capsule, Take 300 mg by mouth at bedtime., Disp: , Rfl:    prochlorperazine (COMPAZINE) 5 MG tablet, Take 5 mg by mouth at bedtime., Disp: , Rfl:    rosuvastatin (CRESTOR) 40 MG tablet, Take 40 mg by mouth daily., Disp: , Rfl:    sodium bicarbonate 650 MG tablet, Take 650 mg by mouth 2  (two) times daily., Disp: , Rfl:    topiramate (TOPAMAX) 50 MG tablet, Take 50 mg by mouth 2 (two) times daily., Disp: , Rfl:    torsemide (DEMADEX) 20 MG tablet, Take 2 tablets (40 mg total) by mouth daily. May take 1 additional tablet as needed, Disp: 90 tablet, Rfl: 2   tretinoin (RETIN-A) 0.05 % cream, Apply 1 application topically at bedtime., Disp: , Rfl:    VASCEPA 1 g capsule, Take 2 capsules (2 g total) by mouth 2 (two) times daily. (Patient not taking: Reported on 01/31/2023), Disp: 120 capsule, Rfl: 11  Social History   Tobacco Use  Smoking Status Never  Smokeless Tobacco Never    Allergies  Allergen Reactions   Ketamine Other (See Comments)    In vegetative state for 15 minutes per pt   [place] Itching    Itchy throat  Itchy throat Itchy throat Itchy throat Itchy throat    Itchy throat  Itchy throat Itchy throat Itchy throat Itchy throat    Itchy throat   Morphine Itching, Rash and Hives    Sorethroat   Penicillins Hives, Itching and Rash    Has patient had a PCN reaction causing immediate rash, facial/tongue/throat swelling, SOB or lightheadedness with hypotension: Y Has patient had a PCN reaction causing severe rash involving mucus membranes or skin necrosis: Y Has patient had a PCN reaction that required hospitalization: N Has patient had a PCN reaction occurring within the last 10 years: Y   Clarithromycin Other (See Comments)    Stomach pain   Erythromycin     Stomach pain   Fentanyl Nausea And Vomiting   Penicillin V Hives, Itching and Other (See Comments)    Gi upset, also   Shellfish Allergy Other (See Comments)    Pt has never had shellfish but tested positive on allergy test. Pt states contrast in CT is okay   Prednisone Rash   Objective:  There were no vitals filed for this visit. Body mass index is 29.65 kg/m. Constitutional Well developed. Well nourished.  Vascular Dorsalis pedis pulses palpable bilaterally. Posterior tibial pulses palpable  bilaterally. Capillary refill normal to all digits.  No cyanosis or clubbing noted. Pedal hair growth normal.  Neurologic Normal speech. Oriented to person, place, and time. Epicritic sensation to light touch grossly present bilaterally.  Dermatologic Left submetatarsal 2 right submetatarsal 3 porokeratosis.  Central nucleated core noted.  Pain on palpation to the lesion no pinpoint bleeding noted  Orthopedic: Normal joint ROM without pain or crepitus bilaterally. No visible deformities. No bony tenderness.   Radiographs: None Assessment:   No diagnosis found.  Plan:  Patient was evaluated and treated and all questions answered.  Left submetatarsal 2 and right submetatarsal 3 porokeratosis -All questions and concerns were discussed with the patient extensive detail given The pain that she is having she will benefit from aggressive debridement of the lesion using chisel blade handle the lesion was debrided down to healthy dry tissue no complication noted no pinpoint bleeding noted. -Shoe gear modification was discussed  No follow-ups on file.

## 2023-03-13 ENCOUNTER — Telehealth: Payer: Self-pay

## 2023-03-13 DIAGNOSIS — E139 Other specified diabetes mellitus without complications: Secondary | ICD-10-CM | POA: Diagnosis not present

## 2023-03-13 DIAGNOSIS — R7989 Other specified abnormal findings of blood chemistry: Secondary | ICD-10-CM | POA: Diagnosis not present

## 2023-03-13 DIAGNOSIS — E039 Hypothyroidism, unspecified: Secondary | ICD-10-CM | POA: Diagnosis not present

## 2023-03-13 DIAGNOSIS — E781 Pure hyperglyceridemia: Secondary | ICD-10-CM | POA: Diagnosis not present

## 2023-03-13 NOTE — Telephone Encounter (Signed)

## 2023-03-14 DIAGNOSIS — E538 Deficiency of other specified B group vitamins: Secondary | ICD-10-CM | POA: Diagnosis not present

## 2023-03-14 DIAGNOSIS — D649 Anemia, unspecified: Secondary | ICD-10-CM | POA: Diagnosis not present

## 2023-03-14 DIAGNOSIS — N1832 Chronic kidney disease, stage 3b: Secondary | ICD-10-CM | POA: Diagnosis not present

## 2023-03-14 DIAGNOSIS — E1159 Type 2 diabetes mellitus with other circulatory complications: Secondary | ICD-10-CM | POA: Diagnosis not present

## 2023-03-14 DIAGNOSIS — R000 Tachycardia, unspecified: Secondary | ICD-10-CM | POA: Diagnosis not present

## 2023-03-14 DIAGNOSIS — Z7689 Persons encountering health services in other specified circumstances: Secondary | ICD-10-CM | POA: Diagnosis not present

## 2023-03-14 DIAGNOSIS — D500 Iron deficiency anemia secondary to blood loss (chronic): Secondary | ICD-10-CM | POA: Diagnosis not present

## 2023-03-14 DIAGNOSIS — I152 Hypertension secondary to endocrine disorders: Secondary | ICD-10-CM | POA: Diagnosis not present

## 2023-03-15 DIAGNOSIS — R3129 Other microscopic hematuria: Secondary | ICD-10-CM | POA: Diagnosis not present

## 2023-03-15 DIAGNOSIS — R300 Dysuria: Secondary | ICD-10-CM | POA: Diagnosis not present

## 2023-03-15 DIAGNOSIS — Z7689 Persons encountering health services in other specified circumstances: Secondary | ICD-10-CM | POA: Diagnosis not present

## 2023-03-27 DIAGNOSIS — D500 Iron deficiency anemia secondary to blood loss (chronic): Secondary | ICD-10-CM | POA: Diagnosis not present

## 2023-03-27 DIAGNOSIS — Z7689 Persons encountering health services in other specified circumstances: Secondary | ICD-10-CM | POA: Diagnosis not present

## 2023-03-29 DIAGNOSIS — Z419 Encounter for procedure for purposes other than remedying health state, unspecified: Secondary | ICD-10-CM | POA: Diagnosis not present

## 2023-04-03 DIAGNOSIS — D649 Anemia, unspecified: Secondary | ICD-10-CM | POA: Diagnosis not present

## 2023-04-03 DIAGNOSIS — D500 Iron deficiency anemia secondary to blood loss (chronic): Secondary | ICD-10-CM | POA: Diagnosis not present

## 2023-04-03 DIAGNOSIS — N1832 Chronic kidney disease, stage 3b: Secondary | ICD-10-CM | POA: Diagnosis not present

## 2023-04-04 DIAGNOSIS — Z7689 Persons encountering health services in other specified circumstances: Secondary | ICD-10-CM | POA: Diagnosis not present

## 2023-04-04 DIAGNOSIS — H34832 Tributary (branch) retinal vein occlusion, left eye, with macular edema: Secondary | ICD-10-CM | POA: Diagnosis not present

## 2023-04-05 ENCOUNTER — Encounter (HOSPITAL_BASED_OUTPATIENT_CLINIC_OR_DEPARTMENT_OTHER): Payer: Self-pay | Admitting: Internal Medicine

## 2023-04-05 ENCOUNTER — Ambulatory Visit (INDEPENDENT_AMBULATORY_CARE_PROVIDER_SITE_OTHER): Payer: Medicaid Other | Admitting: Internal Medicine

## 2023-04-05 VITALS — BP 122/82 | HR 111 | Resp 17 | Ht <= 58 in | Wt 142.0 lb

## 2023-04-05 DIAGNOSIS — Z8719 Personal history of other diseases of the digestive system: Secondary | ICD-10-CM

## 2023-04-05 DIAGNOSIS — I5022 Chronic systolic (congestive) heart failure: Secondary | ICD-10-CM

## 2023-04-05 DIAGNOSIS — E781 Pure hyperglyceridemia: Secondary | ICD-10-CM

## 2023-04-05 DIAGNOSIS — Z7689 Persons encountering health services in other specified circumstances: Secondary | ICD-10-CM | POA: Diagnosis not present

## 2023-04-05 NOTE — Patient Instructions (Signed)
 Medication Instructions:  NO CHANGES today  *If you need a refill on your cardiac medications before your next appointment, please call your pharmacy*   Lab Work: Lipid Panel and Direct LDL today  If you have labs (blood work) drawn today and your tests are completely normal, you will receive your results only by: MyChart Message (if you have MyChart) OR A paper copy in the mail If you have any lab test that is abnormal or we need to change your treatment, we will call you to review the results.    Follow-Up: At [facility], you and your health needs are our priority.  As part of our continuing mission to provide you with exceptional heart care, we have created designated Provider Care Teams.  These Care Teams include your primary Cardiologist (physician) and Advanced Practice Providers (APPs -  Physician Assistants and Nurse Practitioners) who all work together to provide you with the care you need, when you need it.  We recommend signing up for the patient portal called MyChart.  Sign up information is provided on this After Visit Summary.  MyChart is used to connect with patients for Virtual Visits (Telemedicine).  Patients are able to view lab/test results, encounter notes, upcoming appointments, etc.  Non-urgent messages can be sent to your provider as well.   To learn more about what you can do with MyChart, go to [email or web address].    Your next appointment:   6 months with Dr. Mona

## 2023-04-05 NOTE — Progress Notes (Signed)
 LIPID CLINIC CONSULT NOTE  Chief Complaint:  Elevated triglycerides  Primary Care Physician: Emilio Joesph DEL, PA-C  Primary Cardiologist:  [name], MD  HPI:  Jennifer [name] is a 33 y.o. female who is being seen today for the evaluation of elevated triglycerides at the request of Emilio Joesph DEL, PA-C.  This is a medically complex 33 year old female kindly referred for evaluation of hypertriglyceridemia.  This is a longstanding issue initially thought to be due to lipoprotein lipase deficiency however she is cared for by [facility] endocrinology and was seen by a [facility] pediatric geneticist who carried out whole exome gene sequencing that failed to identify a cause of her hypertriglyceridemia.  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  She has subsequently been maintained on fenofibrate  160 mg daily.  Recent review of her lab work, however shows that she does have a history of chronic kidney disease which was significant in July with a creatinine of 2.62 however improved to 1.43 more recently.  This with estimated GFR less than 50, however.  [name] not on any additional triglyceride lowering therapies.  04/05/2023  Ms. Zingg is back today for follow-up.  She has continued to struggle with high triglycerides.  Unfortunately she could not take Vascepa  because of a newly identified fish allergy.  She says she is going to try to establish with an allergist to see if possibly she could become desensitized to that.  She has had recurrent hospitalizations with high triglycerides.  She denies recurrent pancreatitis.  She was last hospitalized in November 2024 with heart failure.  She has not had recent assessment of triglycerides.  We did discuss today that there is a newly approved medication for triglyceride lowering that she might be a candidate for.  PMHx:  Past Medical History:  Diagnosis Date   Afib (HCC) 05/12/2021   Brain tumor (HCC)  03/29/1995   astrocytoma   CHF (congestive heart failure) (HCC)    Cholesterosis    CKD (chronic kidney disease) stage 4, GFR 15-29 ml/min (HCC) 05/13/2021   DM (diabetes mellitus) (HCC) 10/10/2018   Fatty liver    HTN (hypertension) 10/10/2018   Hypothyroidism 10/10/2018   Lipoprotein deficiency    Lung disease    longevity long term   Pancreatitis    Polycystic ovary syndrome     Past Surgical History:  Procedure Laterality Date   ABDOMINAL SURGERY     pt states during miscarriage got her intestine   BRAIN SURGERY     EYE MUSCLE SURGERY Right 03/28/2014   PRESSURE SENSOR/CARDIOMEMS N/A 12/06/2021   Procedure: PRESSURE SENSOR/CARDIOMEMS;  Surgeon: [name] Toribio SAUNDERS, MD;  Location: [facility] INVASIVE CV LAB;  Service: Cardiovascular;  Laterality: N/A;   RIGHT HEART CATH N/A 08/05/2021   Procedure: RIGHT HEART CATH;  Surgeon: [name] Toribio SAUNDERS, MD;  Location: [facility] INVASIVE CV LAB;  Service: Cardiovascular;  Laterality: N/A;   RIGHT HEART CATH N/A 12/06/2021   Procedure: RIGHT HEART CATH;  Surgeon: [name] Toribio SAUNDERS, MD;  Location: [facility] INVASIVE CV LAB;  Service: Cardiovascular;  Laterality: N/A;   RIGHT/LEFT HEART CATH AND CORONARY ANGIOGRAPHY N/A 12/03/2020   Procedure: RIGHT/LEFT HEART CATH AND CORONARY ANGIOGRAPHY;  Surgeon: Bergamo Gordy, MD;  Location: [facility] INVASIVE CV LAB;  Service: Cardiovascular;  Laterality: N/A;   VENTRICULOSTOMY  03/28/1997    FAMHx:  Family History  Problem Relation Age of Onset   Diabetes Mother    Hypertension  Mother    Hyperlipidemia Mother    Thyroid  disease Mother    Hypertension Father    Diabetes Father    Pancreatic cancer Paternal Aunt    Pancreatic cancer Paternal Uncle    Colon cancer Neg Hx    Esophageal cancer Neg Hx    Stomach cancer Neg Hx     SOCHx:   reports that she has never smoked. She has never used smokeless tobacco. She reports that she does not drink alcohol  and does not use drugs.  ALLERGIES:  Allergies  Allergen Reactions    Ketamine Other (See Comments)    In vegetative state for 15 minutes per pt   [place] Itching    Itchy throat  Itchy throat Itchy throat Itchy throat Itchy throat    Itchy throat  Itchy throat Itchy throat Itchy throat Itchy throat    Itchy throat   Morphine  Itching, Rash and Hives    Sorethroat   Penicillins Hives, Itching and Rash    Has patient had a PCN reaction causing immediate rash, facial/tongue/throat swelling, SOB or lightheadedness with hypotension: Y Has patient had a PCN reaction causing severe rash involving mucus membranes or skin necrosis: Y Has patient had a PCN reaction that required hospitalization: N Has patient had a PCN reaction occurring within the last 10 years: Y   Clarithromycin Other (See Comments)    Stomach pain   Erythromycin      Stomach pain   Fentanyl  Nausea And Vomiting   Penicillin V Hives, Itching and Other (See Comments)    Gi upset, also   Shellfish Allergy Other (See Comments)    Pt has never had shellfish but tested positive on allergy test. Pt states contrast in CT is okay   Prednisone Rash    ROS: Pertinent items noted in HPI and remainder of comprehensive ROS otherwise negative.  HOME MEDS: Current Outpatient Medications on File Prior to Visit  Medication Sig Dispense Refill   amitriptyline  (ELAVIL ) 10 MG tablet Take 10 mg by mouth at bedtime.     aspirin  EC 81 MG EC tablet Take 1 tablet (81 mg total) by mouth daily. Swallow whole. 30 tablet 1   azelastine  (ASTELIN ) 0.1 % nasal spray Place 2 sprays into both nostrils 2 (two) times daily.     BLISOVI  FE 1.5/30 1.5-30 MG-MCG tablet Take 1 tablet by mouth daily.     butalbital -acetaminophen -caffeine  (FIORICET ) 50-325-40 MG tablet Take 1 tablet by mouth daily as needed for headache.     carvedilol  (COREG ) 3.125 MG tablet Take 1 tablet (3.125 mg total) by mouth 2 (two) times daily. 60 tablet 11   CRANBERRY CONCENTRATE PO Take 2 tablets by mouth 2 (two) times daily.     CREON  36000-114000  units CPEP capsule Take 36,000 Units by mouth 3 (three) times daily.     DULoxetine  (CYMBALTA ) 60 MG capsule Take 60 mg by mouth at bedtime.     ergocalciferol  (VITAMIN D2) 1.25 MG (50000 UT) capsule Take 50,000 Units by mouth every 7 (seven) days. Every Sunday     Evolocumab  (REPATHA ) 140 MG/ML SOSY Inject 140 mg into the skin See admin instructions. Inject 140 mg subcutaneously every other Wednesday evening     fenofibrate  54 MG tablet Take 1 tablet (54 mg total) by mouth daily. 30 tablet 11   hydrALAZINE  (APRESOLINE ) 25 MG tablet Take 1.5 tablets (37.5 mg total) by mouth 3 (three) times daily. 135 tablet 6   insulin  lispro (HUMALOG ) 100 UNIT/ML injection Inject 30-35 Units  into the skin 3 (three) times daily before meals.     Insulin  Pen Needle 32G X 4 MM MISC 1 Device by Does not apply route QID. For use with insulin  pens 100 each 0   insulin  regular human CONCENTRATED (HUMULIN  R U-500 KWIKPEN) 500 UNIT/ML KwikPen Inject 185 Units into the skin 3 (three) times daily with meals. (Patient taking differently: Inject 195 Units into the skin 3 (three) times daily with meals.) 33.3 mL 0   ivabradine  (CORLANOR ) 7.5 MG TABS tablet Take 1 tablet (7.5 mg total) by mouth 2 (two) times daily with a meal. 60 tablet 6   levocetirizine (XYZAL ) 5 MG tablet Take 5 mg by mouth daily.     levothyroxine  (SYNTHROID ) 137 MCG tablet Take 137 mcg by mouth daily.     pantoprazole  (PROTONIX ) 40 MG tablet Take 40 mg by mouth daily.     pregabalin  (LYRICA ) 300 MG capsule Take 300 mg by mouth at bedtime.     prochlorperazine  (COMPAZINE ) 5 MG tablet Take 5 mg by mouth at bedtime.     rosuvastatin  (CRESTOR ) 40 MG tablet Take 40 mg by mouth daily.     sodium bicarbonate  650 MG tablet Take 650 mg by mouth 2 (two) times daily.     SUMAtriptan  (IMITREX ) 25 MG tablet Take 25 mg by mouth every 2 (two) hours as needed.     topiramate  (TOPAMAX ) 50 MG tablet Take 50 mg by mouth 2 (two) times daily.     torsemide  (DEMADEX ) 20 MG  tablet Take 2 tablets (40 mg total) by mouth daily. May take 1 additional tablet as needed 90 tablet 2   tretinoin  (RETIN-A ) 0.05 % cream Apply 1 application topically at bedtime.     No current facility-administered medications on file prior to visit.    LABS/IMAGING: No results found for this or any previous visit (from the past 48 hours). No results found.  LIPID PANEL:    Component Value Date/Time   CHOL 957 (H) 01/18/2023 0857   TRIG >5,000 (H) 01/19/2023 0032   HDL NOT REPORTED DUE TO HIGH TRIGLYCERIDES 01/18/2023 0857   CHOLHDL NOT REPORTED DUE TO HIGH TRIGLYCERIDES 01/18/2023 0857   VLDL UNABLE TO CALCULATE IF TRIGLYCERIDE OVER 400 mg/dL 89/76/7975 9142   LDLCALC UNABLE TO CALCULATE IF TRIGLYCERIDE OVER 400 mg/dL 89/76/7975 9142   LDLDIRECT UNABLE TO CALCULATE IF TRIGLYCERIDE IS >1293 mg/dL 89/76/7975 9142    WEIGHTS: Wt Readings from Last 3 Encounters:  04/05/23 142 lb (64.4 kg)  03/03/23 137 lb (62.1 kg)  01/31/23 137 lb 3.2 oz (62.2 kg)    VITALS: BP 122/82 (BP Location: Right Arm, Patient Position: Sitting, Cuff Size: Normal)   Pulse (!) 111   Resp 17   Ht 4' 9 (1.448 m)   Wt 142 lb (64.4 kg)   SpO2 97%   BMI 30.73 kg/m   EXAM: Deferred  EKG:      ASSESSMENT: Hyperchylomicronemia, 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   Ms. Kamel continues to have severely elevated triglycerides.  Clinically she may have FCS.  She has not had genetic testing.  She might be a candidate for [employer].  I am planning to reach out to the company today to see what would be involved in getting this newly approved medication which is an APO C3 inhibitor to  her as an option to further lower her triglycerides.  Once I know more I will reach  out to her about this.  For now we will plan to repeat her lipids and a direct LDL to see what her current triglyceride levels are.  Plan otherwise follow-up with me in 6 months or sooner as necessary.  Vinie KYM Maxcy, MD, [facility], FACP  [place]  [facility]  Medical Director of the [facility] Lipid Disorders &  Cardiovascular Risk Reduction Clinic Diplomate of the American Board of Clinical Lipidology Attending Cardiologist  Direct Dial: [phone]  Fax: [phone]  Website:  [email or web address]  Vinie BROCKS Waylan Busta 04/05/2023, 2:01 PM

## 2023-04-06 LAB — LIPID PANEL
Chol/HDL Ratio: 12.8 {ratio} — ABNORMAL HIGH (ref 0.0–4.4)
Cholesterol, Total: 409 mg/dL — ABNORMAL HIGH (ref 100–199)
HDL: 32 mg/dL — ABNORMAL LOW (ref >39)
Triglycerides: 1609 mg/dL (ref 0–149)

## 2023-04-06 LAB — LDL CHOLESTEROL, DIRECT: LDL Direct: 90 mg/dL (ref 0–99)

## 2023-04-10 ENCOUNTER — Encounter (HOSPITAL_BASED_OUTPATIENT_CLINIC_OR_DEPARTMENT_OTHER): Payer: Self-pay | Admitting: Emergency Medicine

## 2023-04-10 ENCOUNTER — Emergency Department (HOSPITAL_BASED_OUTPATIENT_CLINIC_OR_DEPARTMENT_OTHER): Payer: Medicaid Other

## 2023-04-10 ENCOUNTER — Inpatient Hospital Stay (HOSPITAL_BASED_OUTPATIENT_CLINIC_OR_DEPARTMENT_OTHER)
Admission: EM | Admit: 2023-04-10 | Discharge: 2023-04-12 | DRG: 641 | Disposition: A | Discharge status: Home / Self Care | Payer: Medicaid Other | Attending: Internal Medicine | Admitting: Internal Medicine

## 2023-04-10 ENCOUNTER — Other Ambulatory Visit: Payer: Self-pay

## 2023-04-10 DIAGNOSIS — R0989 Other specified symptoms and signs involving the circulatory and respiratory systems: Secondary | ICD-10-CM | POA: Diagnosis not present

## 2023-04-10 DIAGNOSIS — Z794 Long term (current) use of insulin: Secondary | ICD-10-CM

## 2023-04-10 DIAGNOSIS — E783 Hyperchylomicronemia: Secondary | ICD-10-CM

## 2023-04-10 DIAGNOSIS — I4891 Unspecified atrial fibrillation: Secondary | ICD-10-CM | POA: Diagnosis present

## 2023-04-10 DIAGNOSIS — E875 Hyperkalemia: Secondary | ICD-10-CM | POA: Diagnosis not present

## 2023-04-10 DIAGNOSIS — E1143 Type 2 diabetes mellitus with diabetic autonomic (poly)neuropathy: Secondary | ICD-10-CM | POA: Diagnosis present

## 2023-04-10 DIAGNOSIS — K3184 Gastroparesis: Secondary | ICD-10-CM | POA: Diagnosis present

## 2023-04-10 DIAGNOSIS — E1142 Type 2 diabetes mellitus with diabetic polyneuropathy: Secondary | ICD-10-CM | POA: Diagnosis present

## 2023-04-10 DIAGNOSIS — E66811 Obesity, class 1: Secondary | ICD-10-CM | POA: Diagnosis present

## 2023-04-10 DIAGNOSIS — I428 Other cardiomyopathies: Secondary | ICD-10-CM | POA: Diagnosis present

## 2023-04-10 DIAGNOSIS — I10 Essential (primary) hypertension: Secondary | ICD-10-CM | POA: Diagnosis not present

## 2023-04-10 DIAGNOSIS — Z6830 Body mass index (BMI) 30.0-30.9, adult: Secondary | ICD-10-CM

## 2023-04-10 DIAGNOSIS — N1832 Chronic kidney disease, stage 3b: Secondary | ICD-10-CM | POA: Diagnosis not present

## 2023-04-10 DIAGNOSIS — I130 Hypertensive heart and chronic kidney disease with heart failure and stage 1 through stage 4 chronic kidney disease, or unspecified chronic kidney disease: Secondary | ICD-10-CM | POA: Diagnosis present

## 2023-04-10 DIAGNOSIS — Z800 Family history of malignant neoplasm of digestive organs: Secondary | ICD-10-CM

## 2023-04-10 DIAGNOSIS — Z881 Allergy status to other antibiotic agents status: Secondary | ICD-10-CM

## 2023-04-10 DIAGNOSIS — E1122 Type 2 diabetes mellitus with diabetic chronic kidney disease: Secondary | ICD-10-CM | POA: Diagnosis not present

## 2023-04-10 DIAGNOSIS — R200 Anesthesia of skin: Secondary | ICD-10-CM | POA: Diagnosis not present

## 2023-04-10 DIAGNOSIS — G8194 Hemiplegia, unspecified affecting left nondominant side: Secondary | ICD-10-CM | POA: Diagnosis not present

## 2023-04-10 DIAGNOSIS — Z79899 Other long term (current) drug therapy: Secondary | ICD-10-CM

## 2023-04-10 DIAGNOSIS — G59 Mononeuropathy in diseases classified elsewhere: Secondary | ICD-10-CM

## 2023-04-10 DIAGNOSIS — Z8249 Family history of ischemic heart disease and other diseases of the circulatory system: Secondary | ICD-10-CM

## 2023-04-10 DIAGNOSIS — Z8349 Family history of other endocrine, nutritional and metabolic diseases: Secondary | ICD-10-CM

## 2023-04-10 DIAGNOSIS — I5022 Chronic systolic (congestive) heart failure: Secondary | ICD-10-CM | POA: Diagnosis not present

## 2023-04-10 DIAGNOSIS — K8689 Other specified diseases of pancreas: Secondary | ICD-10-CM | POA: Diagnosis present

## 2023-04-10 DIAGNOSIS — K760 Fatty (change of) liver, not elsewhere classified: Secondary | ICD-10-CM | POA: Diagnosis present

## 2023-04-10 DIAGNOSIS — Z79890 Hormone replacement therapy (postmenopausal): Secondary | ICD-10-CM

## 2023-04-10 DIAGNOSIS — E872 Acidosis, unspecified: Secondary | ICD-10-CM | POA: Diagnosis not present

## 2023-04-10 DIAGNOSIS — E669 Obesity, unspecified: Secondary | ICD-10-CM | POA: Diagnosis present

## 2023-04-10 DIAGNOSIS — Z888 Allergy status to other drugs, medicaments and biological substances status: Secondary | ICD-10-CM

## 2023-04-10 DIAGNOSIS — D496 Neoplasm of unspecified behavior of brain: Secondary | ICD-10-CM | POA: Diagnosis not present

## 2023-04-10 DIAGNOSIS — E039 Hypothyroidism, unspecified: Secondary | ICD-10-CM | POA: Diagnosis not present

## 2023-04-10 DIAGNOSIS — N179 Acute kidney failure, unspecified: Secondary | ICD-10-CM | POA: Insufficient documentation

## 2023-04-10 DIAGNOSIS — E781 Pure hyperglyceridemia: Secondary | ICD-10-CM

## 2023-04-10 DIAGNOSIS — E1165 Type 2 diabetes mellitus with hyperglycemia: Secondary | ICD-10-CM

## 2023-04-10 DIAGNOSIS — Z91013 Allergy to seafood: Secondary | ICD-10-CM

## 2023-04-10 DIAGNOSIS — R7989 Other specified abnormal findings of blood chemistry: Secondary | ICD-10-CM | POA: Diagnosis not present

## 2023-04-10 DIAGNOSIS — G629 Polyneuropathy, unspecified: Secondary | ICD-10-CM

## 2023-04-10 DIAGNOSIS — R202 Paresthesia of skin: Secondary | ICD-10-CM | POA: Diagnosis not present

## 2023-04-10 DIAGNOSIS — Z7982 Long term (current) use of aspirin: Secondary | ICD-10-CM

## 2023-04-10 DIAGNOSIS — Z1152 Encounter for screening for COVID-19: Secondary | ICD-10-CM | POA: Diagnosis not present

## 2023-04-10 DIAGNOSIS — Z83438 Family history of other disorder of lipoprotein metabolism and other lipidemia: Secondary | ICD-10-CM

## 2023-04-10 DIAGNOSIS — Z833 Family history of diabetes mellitus: Secondary | ICD-10-CM

## 2023-04-10 DIAGNOSIS — E11649 Type 2 diabetes mellitus with hypoglycemia without coma: Secondary | ICD-10-CM | POA: Diagnosis present

## 2023-04-10 DIAGNOSIS — G9389 Other specified disorders of brain: Secondary | ICD-10-CM | POA: Diagnosis not present

## 2023-04-10 DIAGNOSIS — Z885 Allergy status to narcotic agent status: Secondary | ICD-10-CM

## 2023-04-10 DIAGNOSIS — Z85841 Personal history of malignant neoplasm of brain: Secondary | ICD-10-CM

## 2023-04-10 DIAGNOSIS — Z880 Allergy status to penicillin: Secondary | ICD-10-CM

## 2023-04-10 LAB — BASIC METABOLIC PANEL
Anion gap: 10 (ref 5–15)
Anion gap: 9 (ref 5–15)
Anion gap: 13 (ref 5–15)
BUN: 39 mg/dL — ABNORMAL HIGH (ref 6–20)
BUN: 42 mg/dL — ABNORMAL HIGH (ref 6–20)
BUN: 45 mg/dL — ABNORMAL HIGH (ref 6–20)
CO2: 19 mmol/L — ABNORMAL LOW (ref 22–32)
CO2: 21 mmol/L — ABNORMAL LOW (ref 22–32)
CO2: 19 mmol/L — ABNORMAL LOW (ref 22–32)
Calcium: 9.9 mg/dL (ref 8.9–10.3)
Calcium: 10 mg/dL (ref 8.9–10.3)
Calcium: 10.3 mg/dL (ref 8.9–10.3)
Chloride: 100 mmol/L (ref 98–111)
Chloride: 98 mmol/L (ref 98–111)
Chloride: 102 mmol/L (ref 98–111)
Creatinine, Ser: 1.93 mg/dL — ABNORMAL HIGH (ref 0.44–1.00)
Creatinine, Ser: 2.05 mg/dL — ABNORMAL HIGH (ref 0.44–1.00)
Creatinine, Ser: 2.1 mg/dL — ABNORMAL HIGH (ref 0.44–1.00)
GFR, Estimated: 35 mL/min — ABNORMAL LOW (ref >60)
GFR, Estimated: 33 mL/min — ABNORMAL LOW (ref >60)
GFR, Estimated: 32 mL/min — ABNORMAL LOW (ref >60)
Glucose, Bld: 405 mg/dL — ABNORMAL HIGH (ref 70–99)
Glucose, Bld: 492 mg/dL — ABNORMAL HIGH (ref 70–99)
Glucose, Bld: 290 mg/dL — ABNORMAL HIGH (ref 70–99)
Potassium: 7.5 mmol/L (ref 3.5–5.1)
Potassium: 6.3 mmol/L (ref 3.5–5.1)
Potassium: 5.1 mmol/L (ref 3.5–5.1)
Sodium: 129 mmol/L — ABNORMAL LOW (ref 135–145)
Sodium: 128 mmol/L — ABNORMAL LOW (ref 135–145)
Sodium: 134 mmol/L — ABNORMAL LOW (ref 135–145)

## 2023-04-10 LAB — CBC WITH DIFFERENTIAL/PLATELET
Abs Immature Granulocytes: 0.04 10*3/uL (ref 0.00–0.07)
Basophils Absolute: 0.1 10*3/uL (ref 0.0–0.1)
Basophils Relative: 1 %
Eosinophils Absolute: 0.1 10*3/uL (ref 0.0–0.5)
Eosinophils Relative: 2 %
HCT: 39.6 % (ref 36.0–46.0)
Hemoglobin: 13.4 g/dL (ref 12.0–15.0)
Immature Granulocytes: 1 %
Lymphocytes Relative: 39 %
Lymphs Abs: 2.7 10*3/uL (ref 0.7–4.0)
MCH: 30.8 pg (ref 26.0–34.0)
MCHC: 33.8 g/dL (ref 30.0–36.0)
MCV: 91 fL (ref 80.0–100.0)
Monocytes Absolute: 0.6 10*3/uL (ref 0.1–1.0)
Monocytes Relative: 8 %
Neutro Abs: 3.5 10*3/uL (ref 1.7–7.7)
Neutrophils Relative %: 49 %
Platelets: 151 10*3/uL (ref 150–400)
RBC: 4.35 MIL/uL (ref 3.87–5.11)
RDW: 15 % (ref 11.5–15.5)
WBC: 7 10*3/uL (ref 4.0–10.5)
nRBC: 0 % (ref 0.0–0.2)

## 2023-04-10 LAB — GLUCOSE, CAPILLARY
Glucose-Capillary: 287 mg/dL — ABNORMAL HIGH (ref 70–99)
Glucose-Capillary: 298 mg/dL — ABNORMAL HIGH (ref 70–99)
Glucose-Capillary: 347 mg/dL — ABNORMAL HIGH (ref 70–99)

## 2023-04-10 LAB — TROPONIN I (HIGH SENSITIVITY): Troponin I (High Sensitivity): 16 ng/L (ref <18)

## 2023-04-10 LAB — MAGNESIUM: Magnesium: 1.7 mg/dL (ref 1.7–2.4)

## 2023-04-10 LAB — URINALYSIS, ROUTINE W REFLEX MICROSCOPIC
Bilirubin Urine: NEGATIVE
Glucose, UA: >1000 mg/dL — AB
Hgb urine dipstick: NEGATIVE
Ketones, ur: NEGATIVE mg/dL
Leukocytes,Ua: NEGATIVE
Nitrite: NEGATIVE
Protein, ur: 30 mg/dL — AB
Specific Gravity, Urine: 1.01 (ref 1.005–1.030)
pH: 6.5 (ref 5.0–8.0)

## 2023-04-10 LAB — RESP PANEL BY RT-PCR (RSV, FLU A&B, COVID)  RVPGX2
Influenza A by PCR: NEGATIVE
Influenza B by PCR: NEGATIVE
Resp Syncytial Virus by PCR: NEGATIVE
SARS Coronavirus 2 by RT PCR: NEGATIVE

## 2023-04-10 LAB — HCG, SERUM, QUALITATIVE: Preg, Serum: NEGATIVE

## 2023-04-10 LAB — CBG MONITORING, ED: Glucose-Capillary: 316 mg/dL — ABNORMAL HIGH (ref 70–99)

## 2023-04-10 LAB — TSH: TSH: 13.461 u[IU]/mL — ABNORMAL HIGH (ref 0.350–4.500)

## 2023-04-10 MED ORDER — PANTOPRAZOLE SODIUM 40 MG PO TBEC
40.0000 mg | DELAYED_RELEASE_TABLET | Freq: Every day | ORAL | Status: DC
Start: 2023-04-11 — End: 2023-04-12
  Administered 2023-04-11 – 2023-04-12 (×2): 40 mg via ORAL
  Filled 2023-04-10 (×2): qty 1

## 2023-04-10 MED ORDER — INSULIN ASPART 100 UNIT/ML IJ SOLN: 45.0000 [IU] | Freq: Every day | INTRAMUSCULAR | Status: DC

## 2023-04-10 MED ORDER — PANCRELIPASE (LIP-PROT-AMYL) 36000-114000 UNITS PO CPEP
72000.0000 [IU] | ORAL_CAPSULE | Freq: Three times a day (TID) | ORAL | Status: DC
  Administered 2023-04-11 – 2023-04-12 (×4): 72000 [IU] via ORAL
  Filled 2023-04-10 (×4): qty 2

## 2023-04-10 MED ORDER — INSULIN REGULAR HUMAN (CONC) 500 UNIT/ML ~~LOC~~ SOPN
180.0000 [IU] | PEN_INJECTOR | Freq: Every day | SUBCUTANEOUS | Status: DC
Start: 2023-04-10 — End: 2023-04-12
  Administered 2023-04-10 – 2023-04-11 (×2): 180 [IU] via SUBCUTANEOUS
  Filled 2023-04-10: qty 3

## 2023-04-10 MED ORDER — SODIUM CHLORIDE 0.9 % IV BOLUS: 1000.0000 mL | Freq: Once | INTRAVENOUS | Status: DC

## 2023-04-10 MED ORDER — IVABRADINE HCL 5 MG PO TABS
7.5000 mg | ORAL_TABLET | Freq: Two times a day (BID) | ORAL | Status: DC
  Administered 2023-04-10 – 2023-04-12 (×4): 7.5 mg via ORAL
  Filled 2023-04-10 (×4): qty 1

## 2023-04-10 MED ORDER — SODIUM BICARBONATE 650 MG PO TABS
1300.0000 mg | ORAL_TABLET | Freq: Two times a day (BID) | ORAL | Status: DC
  Administered 2023-04-10 – 2023-04-12 (×4): 1300 mg via ORAL
  Filled 2023-04-10 (×4): qty 2

## 2023-04-10 MED ORDER — FUROSEMIDE 10 MG/ML IJ SOLN: 40.0000 mg | Freq: Once | INTRAMUSCULAR | Status: DC

## 2023-04-10 MED ORDER — ALBUTEROL SULFATE (2.5 MG/3ML) 0.083% IN NEBU
10.0000 mg | INHALATION_SOLUTION | Freq: Once | RESPIRATORY_TRACT | Status: AC
Start: 2023-04-10 — End: 2023-04-10
  Administered 2023-04-10: 10 mg via RESPIRATORY_TRACT
  Filled 2023-04-10: qty 12

## 2023-04-10 MED ORDER — SODIUM ZIRCONIUM CYCLOSILICATE 10 G PO PACK
10.0000 g | PACK | Freq: Three times a day (TID) | ORAL | Status: DC
  Administered 2023-04-10: 10 g via ORAL
  Filled 2023-04-10: qty 1

## 2023-04-10 MED ORDER — SODIUM ZIRCONIUM CYCLOSILICATE 10 G PO PACK
10.0000 g | PACK | Freq: Once | ORAL | Status: AC
  Administered 2023-04-10: 10 g via ORAL
  Filled 2023-04-10: qty 1

## 2023-04-10 MED ORDER — FENOFIBRATE 54 MG PO TABS
54.0000 mg | ORAL_TABLET | Freq: Every day | ORAL | Status: DC
Start: 2023-04-11 — End: 2023-04-12
  Administered 2023-04-11 – 2023-04-12 (×2): 54 mg via ORAL
  Filled 2023-04-10 (×2): qty 1

## 2023-04-10 MED ORDER — SODIUM CHLORIDE 0.9 % IV SOLN: INTRAVENOUS | Status: DC

## 2023-04-10 MED ORDER — CALCIUM GLUCONATE 10 % IV SOLN
1.0000 g | Freq: Once | INTRAVENOUS | Status: DC
  Filled 2023-04-10: qty 10

## 2023-04-10 MED ORDER — LORATADINE 10 MG PO TABS
10.0000 mg | ORAL_TABLET | Freq: Every day | ORAL | Status: DC
  Administered 2023-04-11 – 2023-04-12 (×2): 10 mg via ORAL
  Filled 2023-04-10 (×2): qty 1

## 2023-04-10 MED ORDER — ENOXAPARIN SODIUM 40 MG/0.4ML IJ SOSY
40.0000 mg | PREFILLED_SYRINGE | INTRAMUSCULAR | Status: DC
  Administered 2023-04-10 – 2023-04-11 (×2): 40 mg via SUBCUTANEOUS
  Filled 2023-04-10 (×2): qty 0.4

## 2023-04-10 MED ORDER — LABETALOL HCL 5 MG/ML IV SOLN
20.0000 mg | Freq: Once | INTRAVENOUS | Status: AC
  Administered 2023-04-10: 20 mg via INTRAVENOUS
  Filled 2023-04-10: qty 4

## 2023-04-10 MED ORDER — ROSUVASTATIN CALCIUM 20 MG PO TABS
40.0000 mg | ORAL_TABLET | Freq: Every day | ORAL | Status: DC
Start: 2023-04-11 — End: 2023-04-12
  Administered 2023-04-11 – 2023-04-12 (×2): 40 mg via ORAL
  Filled 2023-04-10 (×2): qty 2

## 2023-04-10 MED ORDER — DULOXETINE HCL 60 MG PO CPEP
60.0000 mg | ORAL_CAPSULE | Freq: Every day | ORAL | Status: DC
  Administered 2023-04-10 – 2023-04-11 (×2): 60 mg via ORAL
  Filled 2023-04-10 (×2): qty 1

## 2023-04-10 MED ORDER — HYDRALAZINE HCL 25 MG PO TABS
37.5000 mg | ORAL_TABLET | Freq: Three times a day (TID) | ORAL | Status: DC
  Administered 2023-04-10 – 2023-04-12 (×5): 37.5 mg via ORAL
  Filled 2023-04-10 (×5): qty 2

## 2023-04-10 MED ORDER — INSULIN ASPART 100 UNIT/ML IV SOLN
5.0000 [IU] | Freq: Once | INTRAVENOUS | Status: AC
  Administered 2023-04-10: 5 [IU] via INTRAVENOUS

## 2023-04-10 MED ORDER — LEVOCETIRIZINE DIHYDROCHLORIDE 5 MG PO TABS: 5.0000 mg | ORAL_TABLET | Freq: Every day | ORAL | Status: DC

## 2023-04-10 MED ORDER — PREGABALIN 100 MG PO CAPS
300.0000 mg | ORAL_CAPSULE | Freq: Two times a day (BID) | ORAL | Status: DC
  Administered 2023-04-10 – 2023-04-12 (×4): 300 mg via ORAL
  Filled 2023-04-10 (×4): qty 3

## 2023-04-10 MED ORDER — ALBUTEROL SULFATE (2.5 MG/3ML) 0.083% IN NEBU
2.5000 mg | INHALATION_SOLUTION | Freq: Once | RESPIRATORY_TRACT | Status: AC
  Administered 2023-04-10: 2.5 mg via RESPIRATORY_TRACT
  Filled 2023-04-10: qty 3

## 2023-04-10 MED ORDER — CALCIUM GLUCONATE-NACL 1-0.675 GM/50ML-% IV SOLN
1.0000 g | Freq: Once | INTRAVENOUS | Status: AC
  Administered 2023-04-10: 1000 mg via INTRAVENOUS
  Filled 2023-04-10: qty 50

## 2023-04-10 MED ORDER — ASPIRIN 81 MG PO TBEC
81.0000 mg | DELAYED_RELEASE_TABLET | Freq: Every day | ORAL | Status: DC
Start: 2023-04-11 — End: 2023-04-12
  Administered 2023-04-11 – 2023-04-12 (×2): 81 mg via ORAL
  Filled 2023-04-10 (×2): qty 1

## 2023-04-10 MED ORDER — DEXTROSE 10 % IV SOLN: Freq: Once | INTRAVENOUS | Status: DC

## 2023-04-10 MED ORDER — INSULIN REGULAR HUMAN (CONC) 500 UNIT/ML ~~LOC~~ SOPN
195.0000 [IU] | PEN_INJECTOR | Freq: Two times a day (BID) | SUBCUTANEOUS | Status: DC
  Administered 2023-04-11 (×2): 195 [IU] via SUBCUTANEOUS

## 2023-04-10 MED ORDER — SODIUM ZIRCONIUM CYCLOSILICATE 10 G PO PACK
10.0000 g | PACK | Freq: Once | ORAL | Status: AC
Start: 2023-04-10 — End: 2023-04-10
  Administered 2023-04-10: 10 g via ORAL
  Filled 2023-04-10: qty 1

## 2023-04-10 MED ORDER — DEXTROSE 50 % IV SOLN: 1.0000 | Freq: Once | INTRAVENOUS | Status: DC

## 2023-04-10 MED ORDER — DEXTROSE 50 % IV SOLN
1.0000 | Freq: Once | INTRAVENOUS | Status: AC
  Administered 2023-04-10: 50 mL via INTRAVENOUS
  Filled 2023-04-10: qty 50

## 2023-04-10 MED ORDER — FUROSEMIDE 10 MG/ML IJ SOLN
40.0000 mg | Freq: Once | INTRAMUSCULAR | Status: AC
  Administered 2023-04-10: 40 mg via INTRAVENOUS
  Filled 2023-04-10: qty 4

## 2023-04-10 MED ORDER — AZELASTINE HCL 0.1 % NA SOLN
2.0000 | Freq: Two times a day (BID) | NASAL | Status: DC
  Administered 2023-04-10 – 2023-04-12 (×4): 2 via NASAL
  Filled 2023-04-10: qty 30

## 2023-04-10 MED ORDER — CARVEDILOL 3.125 MG PO TABS
3.1250 mg | ORAL_TABLET | Freq: Two times a day (BID) | ORAL | Status: DC
  Administered 2023-04-10 – 2023-04-12 (×4): 3.125 mg via ORAL
  Filled 2023-04-10 (×4): qty 1

## 2023-04-10 MED ORDER — TOPIRAMATE 25 MG PO TABS
50.0000 mg | ORAL_TABLET | Freq: Two times a day (BID) | ORAL | Status: DC
  Administered 2023-04-10 – 2023-04-12 (×4): 50 mg via ORAL
  Filled 2023-04-10 (×4): qty 2

## 2023-04-10 MED ORDER — DIAZEPAM 5 MG/ML IJ SOLN
5.0000 mg | Freq: Once | INTRAMUSCULAR | Status: AC
  Administered 2023-04-10: 5 mg via INTRAVENOUS
  Filled 2023-04-10: qty 2

## 2023-04-10 MED ORDER — PROCHLORPERAZINE MALEATE 10 MG PO TABS: 10.0000 mg | ORAL_TABLET | Freq: Every day | ORAL | Status: DC | PRN

## 2023-04-10 MED ORDER — INSULIN ASPART 100 UNIT/ML IJ SOLN
30.0000 [IU] | Freq: Two times a day (BID) | INTRAMUSCULAR | Status: DC
  Administered 2023-04-10: 30 [IU] via SUBCUTANEOUS

## 2023-04-10 MED ORDER — AMITRIPTYLINE HCL 10 MG PO TABS
10.0000 mg | ORAL_TABLET | Freq: Every day | ORAL | Status: DC
  Administered 2023-04-10 – 2023-04-11 (×2): 10 mg via ORAL
  Filled 2023-04-10 (×2): qty 1

## 2023-04-10 MED ORDER — LEVOTHYROXINE SODIUM 25 MCG PO TABS
137.0000 ug | ORAL_TABLET | Freq: Every day | ORAL | Status: DC
  Administered 2023-04-11 – 2023-04-12 (×2): 137 ug via ORAL
  Filled 2023-04-10 (×2): qty 1

## 2023-04-10 NOTE — H&P (Addendum)
 History and Physical    Patient: Jennifer [name] FMW:[id number] DOB: June 13, 1990 DOA: 04/10/2023 DOS: the patient was seen and examined on 04/10/2023 PCP: Emilio Joesph DEL, PA-C  Patient coming from:  [facility]  Chief Complaint:  Chief Complaint  Patient presents with   Numbness   HPI: Jennifer [name] is a 33 y.o. female with medical history significant of Hyperchylomicronemia, HFrEF, pancreatitis, astrocytoma, insulin -dependent type 2 diabetes with gastroparesis and peripheral neuropathy, hypertension, CKD 3B, pancreatic insufficiency who presented to outside ED with generalized paresthesia, weakness and fall and found to have hyperkalemia.   Patient has paresthesia to her legs at baseline secondary to her diabetes.  However for the past several days she has noticed generalized paresthesias throughout her body.  Today she became more unsteady and weak prompting her to present to the ED.  She has chronic nausea but denies vomiting.  No diarrhea.  Has good oral intake.  Has not missed any of her medications.  On arrival to the ED, she was afebrile, tachycardic with heart rate of 115s which she reports is better than her usual baseline, blood pressure was elevated to 190/120.  She was found to have severe hyperkalemia of 7.5 with peaked T waves seen on EKG.  Also has AKI with creatinine of 2.05 up from 1.75. Glucose of 492, CO2 21 with normal anion gap.  Pseudohyponatremia of 129.  CBC was unremarkable.  CTA head was negative.  Patient was administered IV labetalol  20 mg with improvement in her blood pressure down to systolic 160s. For hyperkalemia, she was given IV calcium  gluconate, albuterol  nebulizer, insulin , Lokelma ,  Lasix  with improvement in potassium down to 6.3.  She was then given additional insulin  and Lasix  prior to transfer here to [facility].   Review of Systems: As mentioned in the history of present illness. All other systems reviewed and are  negative. Past Medical History:  Diagnosis Date   Afib (HCC) 05/12/2021   Brain tumor (HCC) 03/29/1995   astrocytoma   CHF (congestive heart failure) (HCC)    Cholesterosis    CKD (chronic kidney disease) stage 4, GFR 15-29 ml/min (HCC) 05/13/2021   DM (diabetes mellitus) (HCC) 10/10/2018   Fatty liver    HTN (hypertension) 10/10/2018   Hypothyroidism 10/10/2018   Lipoprotein deficiency    Lung disease    longevity long term   Pancreatitis    Polycystic ovary syndrome    Past Surgical History:  Procedure Laterality Date   ABDOMINAL SURGERY     pt states during miscarriage got her intestine   BRAIN SURGERY     EYE MUSCLE SURGERY Right 03/28/2014   PRESSURE SENSOR/CARDIOMEMS N/A 12/06/2021   Procedure: PRESSURE SENSOR/CARDIOMEMS;  Surgeon: [name] Toribio SAUNDERS, MD;  Location: [facility];  Service: Cardiovascular;  Laterality: N/A;   RIGHT HEART CATH N/A 08/05/2021   Procedure: RIGHT HEART CATH;  Surgeon: [name] Toribio SAUNDERS, MD;  Location: [facility];  Service: Cardiovascular;  Laterality: N/A;   RIGHT HEART CATH N/A 12/06/2021   Procedure: RIGHT HEART CATH;  Surgeon: [name] Toribio SAUNDERS, MD;  Location: [facility];  Service: Cardiovascular;  Laterality: N/A;   RIGHT/LEFT HEART CATH AND CORONARY ANGIOGRAPHY N/A 12/03/2020   Procedure: RIGHT/LEFT HEART CATH AND CORONARY ANGIOGRAPHY;  Surgeon: Ladona Heinz, MD;  Location: [facility];  Service: Cardiovascular;  Laterality: N/A;   VENTRICULOSTOMY  03/28/1997   Social History:  reports that she has never smoked. She has never used smokeless tobacco.  She reports that she does not drink alcohol  and does not use drugs.  Allergies  Allergen Reactions   Icosapent  Ethyl (Epa Ethyl Ester) (Fish)     Hives w/Vascepa    Ketamine Other (See Comments)    In vegetative state for 15 minutes per pt   [place] Itching    Itchy throat  Itchy throat Itchy throat Itchy throat Itchy throat    Itchy throat  Itchy throat  Itchy throat Itchy throat Itchy throat    Itchy throat   Morphine  Itching, Rash and Hives    Sorethroat   Penicillins Hives, Itching and Rash    Has patient had a PCN reaction causing immediate rash, facial/tongue/throat swelling, SOB or lightheadedness with hypotension: Y Has patient had a PCN reaction causing severe rash involving mucus membranes or skin necrosis: Y Has patient had a PCN reaction that required hospitalization: N Has patient had a PCN reaction occurring within the last 10 years: Y   Clarithromycin Other (See Comments)    Stomach pain   Erythromycin      Stomach pain   Fentanyl  Nausea And Vomiting   Penicillin V Hives, Itching and Other (See Comments)    Gi upset, also   Shellfish Allergy Other (See Comments)    Pt has never had shellfish but tested positive on allergy test. Pt states contrast in CT is okay   Prednisone Rash    Family History  Problem Relation Age of Onset   Diabetes Mother    Hypertension Mother    Hyperlipidemia Mother    Thyroid  disease Mother    Hypertension Father    Diabetes Father    Pancreatic cancer Paternal Aunt    Pancreatic cancer Paternal Uncle    Colon cancer Neg Hx    Esophageal cancer Neg Hx    Stomach cancer Neg Hx     Prior to Admission medications   Medication Sig Start Date End Date Taking? Authorizing Provider  amitriptyline  (ELAVIL ) 10 MG tablet Take 10 mg by mouth at bedtime.    [provider]  aspirin  EC 81 MG EC tablet Take 1 tablet (81 mg total) by mouth daily. Swallow whole. 12/07/20   Fausto Sor A, DO  azelastine  (ASTELIN ) 0.1 % nasal spray Place 2 sprays into both nostrils 2 (two) times daily.    [provider]  BLISOVI  FE 1.5/30 1.5-30 MG-MCG tablet Take 1 tablet by mouth daily.    [provider]  butalbital -acetaminophen -caffeine  (FIORICET ) 50-325-40 MG tablet Take 1 tablet by mouth daily as needed for headache. 09/26/22   [provider]  carvedilol  (COREG ) 3.125 MG  tablet Take 1 tablet (3.125 mg total) by mouth 2 (two) times daily. 10/04/22   [name], Daniel R, MD  CRANBERRY CONCENTRATE PO Take 2 tablets by mouth 2 (two) times daily.    [provider]  CREON  36000-114000 units CPEP capsule Take 36,000 Units by mouth 3 (three) times daily.    [provider]  DULoxetine  (CYMBALTA ) 60 MG capsule Take 60 mg by mouth at bedtime. 09/26/22   [provider]  ergocalciferol  (VITAMIN D2) 1.25 MG (50000 UT) capsule Take 50,000 Units by mouth every 7 (seven) days. Every Sunday    [provider]  Evolocumab  (REPATHA ) 140 MG/ML SOSY Inject 140 mg into the skin See admin instructions. Inject 140 mg subcutaneously every other Wednesday evening    [provider]  fenofibrate  54 MG tablet Take 1 tablet (54 mg total) by mouth daily. 12/01/22   Mona Kent  C, MD  hydrALAZINE  (APRESOLINE ) 25 MG tablet Take 1.5 tablets (37.5 mg total) by mouth 3 (three) times daily. 01/31/23 05/01/23  Glena Harlene HERO, FNP  insulin  lispro (HUMALOG ) 100 UNIT/ML injection Inject 30-35 Units into the skin 3 (three) times daily before meals.    [provider]  Insulin  Pen Needle 32G X 4 MM MISC 1 Device by Does not apply route QID. For use with insulin  pens 02/13/22   Cindy Garnette POUR, MD  insulin  regular human CONCENTRATED (HUMULIN  R U-500 KWIKPEN) 500 UNIT/ML KwikPen Inject 185 Units into the skin 3 (three) times daily with meals. Patient taking differently: Inject 195 Units into the skin 3 (three) times daily with meals. 09/20/22 01/09/24  Ezenduka, Nkeiruka J, MD  ivabradine  (CORLANOR ) 7.5 MG TABS tablet Take 1 tablet (7.5 mg total) by mouth 2 (two) times daily with a meal. 01/31/23   [place], Harlene HERO, FNP  levocetirizine (XYZAL ) 5 MG tablet Take 5 mg by mouth daily. 03/05/22   [provider]  levothyroxine  (SYNTHROID ) 137 MCG tablet Take 137 mcg by mouth daily. 09/20/22   [provider]  pantoprazole  (PROTONIX ) 40 MG tablet  Take 40 mg by mouth daily. 12/24/19   [provider]  pregabalin  (LYRICA ) 300 MG capsule Take 300 mg by mouth at bedtime.    [provider]  prochlorperazine  (COMPAZINE ) 5 MG tablet Take 5 mg by mouth at bedtime.    [provider]  rosuvastatin  (CRESTOR ) 40 MG tablet Take 40 mg by mouth daily. 08/09/21   [provider]  sodium bicarbonate  650 MG tablet Take 650 mg by mouth 2 (two) times daily.    [provider]  SUMAtriptan  (IMITREX ) 25 MG tablet Take 25 mg by mouth every 2 (two) hours as needed. 01/30/23   [provider]  topiramate  (TOPAMAX ) 50 MG tablet Take 50 mg by mouth 2 (two) times daily.    [provider]  torsemide  (DEMADEX ) 20 MG tablet Take 2 tablets (40 mg total) by mouth daily. May take 1 additional tablet as needed 10/05/22   [name], Toribio SAUNDERS, MD  tretinoin  (RETIN-A ) 0.05 % cream Apply 1 application topically at bedtime. 01/17/21   [provider]    Physical Exam: Vitals:   04/10/23 1745 04/10/23 1836 04/10/23 1841 04/10/23 1938  BP: (!) 160/88 (!) 147/93  (!) 152/94  Pulse:      Resp:    18  Temp:  98 F (36.7 C)  98 F (36.7 C)  TempSrc:  Oral  Oral  SpO2:  96%    Weight:   64.9 kg   Height:   4' 9 (1.448 m)    Constitutional: NAD, calm, comfortable, chronically ill-appearing young female sitting upright on side of the bed restless with her lower extremity secondary to paresthesia Eyes: lids and conjunctivae normal ENMT: Mucous membranes are moist. Neck: normal, supple Respiratory: clear to auscultation bilaterally, no wheezing, no crackles. Normal respiratory effort. No accessory muscle use.  Cardiovascular: Regular rate and rhythm, no murmurs / rubs / gallops. No extremity edema.  Abdomen: no tenderness, soft Musculoskeletal: no clubbing / cyanosis. No joint deformity upper and lower extremities. Normal muscle tone.  Skin: no rashes, lesions, ulcers. No induration Neurologic: CN 2-12  grossly intact.  Strength 5/5 in all 4.  Psychiatric: Normal judgment and insight. Alert and oriented x 3. Normal mood.   Data Reviewed:  See HPI  Assessment and Plan: * Hyperkalemia -severe hyperkalemia of 7.5 with peaked T waves seen  on EKG -likely secondary to AKI  -given IV calcium  gluconate, albuterol  nebulizer, insulin , Lokelma , Lasix  IV 40mg  x 2. Repeat Potassium improved to 5.1.  -will administer her home nighttime insulin  as she is on high dose with U500 and Humalog  -Follow potassium trend  Acute kidney injury superimposed on stage 3b chronic kidney disease (HCC) -creatinine of 2.05 up from 1.75. -keep on continuous IV fluid. [name] on diuretics on home which she held today. However she did receive IV 40mg  Lasix  x 2 for her hyperkalemia. Will hold any additional diuretics at this time  Type 2 diabetes mellitus with hyperglycemia, with long-term current use of insulin  (HCC) -Last A1C of 6.9.  -Home regimen:  U-500- 200 U for breakfast, 195 U for lunch, 180 U for dinner  -will keep on 180 units TID with meals here Humalog  - 45 units for breakfast and  30 units for lunch and dinner - keep on 30 units TID with meals here   Metabolic acidosis - Secondary to AKI and hyperglycemia - Follow trend with continuous IV fluids -Continue home sodium bicarb  Peripheral neuropathy - Continue home Cymbalta  and Lyrica   Hyperchylomicronemia - Continue statin, fenofibrate  -[name] also on Repatha  injections.  Currently following with cardiology and may start on a new investigative drug.  Pseudohyponatremia -secondary to hyperglycemia   Obesity (BMI 30-39.9) - BMI of 30  HTN (hypertension) Elevated - Improved with dose of IV labetalol  in the ED -Continue home hydralazine  TID, Coreg  BID   Chronic systolic CHF (congestive heart failure) (HCC) -Last TTE on 09/2022 with EF of 50 to 55%, indeterminate diastolic parameters.  No significant valvular  abnormalities.  Hypothyroidism -Continue levothyroxine .  -Repeat TSH although paresthesia likely secondary to hyperkalemia      Advance Care Planning: Full  Consults: none  Family Communication: none at bedside  Severity of Illness: The appropriate patient status for this patient is INPATIENT. Inpatient status is judged to be reasonable and necessary in order to provide the required intensity of service to ensure the patient's safety. The patient's presenting symptoms, physical exam findings, and initial radiographic and laboratory data in the context of their chronic comorbidities is felt to place them at high risk for further clinical deterioration. Furthermore, it is not anticipated that the patient will be medically stable for discharge from the hospital within 2 midnights of admission.   * I certify that at the point of admission it is my clinical judgment that the patient will require inpatient hospital care spanning beyond 2 midnights from the point of admission due to high intensity of service, high risk for further deterioration and high frequency of surveillance required.*  Author: Alfrieda ONEIDA Pillar, DO 04/10/2023 9:54 PM  For on call review [email or web address].

## 2023-04-10 NOTE — ED Provider Notes (Signed)
 [place] EMERGENCY DEPARTMENT AT [facility] Provider Note   CSN: [id number] Arrival date & time: 04/10/23  1149     History  Chief Complaint  Patient presents with   Numbness    Jennifer [name] is a 33 y.o. female with a history of astrocytoma, CHF, and diabetes mellitus who presents to the ED today for numbness. Patient reports numbness and tingling throughout her whole body that began 3 days ago and has persisted since. Additionally, she reports generalized weakness that began this morning. No vision changes or slurred speech. She states that she fell to her knees getting out of bed this morning.  She did not hit her head or lose consciousness when she fell.  No recent injuries or head trauma. Denies fevers, N/V/D, or cough.  Denies decreased oral intake.    Home Medications Prior to Admission medications   Medication Sig Start Date End Date Taking? Authorizing Provider  amitriptyline  (ELAVIL ) 10 MG tablet Take 10 mg by mouth at bedtime.    [provider]  aspirin  EC 81 MG EC tablet Take 1 tablet (81 mg total) by mouth daily. Swallow whole. 12/07/20   Fausto Burnard LABOR, DO  azelastine  (ASTELIN ) 0.1 % nasal spray Place 2 sprays into both nostrils 2 (two) times daily.    [provider]  BLISOVI  FE 1.5/30 1.5-30 MG-MCG tablet Take 1 tablet by mouth daily.    [provider]  butalbital -acetaminophen -caffeine  (FIORICET ) 50-325-40 MG tablet Take 1 tablet by mouth daily as needed for headache. 09/26/22   [provider]  carvedilol  (COREG ) 3.125 MG tablet Take 1 tablet (3.125 mg total) by mouth 2 (two) times daily. 10/04/22   [name], Daniel R, MD  CRANBERRY CONCENTRATE PO Take 2 tablets by mouth 2 (two) times daily.    [provider]  CREON  36000-114000 units CPEP capsule Take 36,000 Units by mouth 3 (three) times daily.    [provider]  DULoxetine  (CYMBALTA ) 60 MG capsule Take 60 mg by mouth at bedtime. 09/26/22    [provider]  ergocalciferol  (VITAMIN D2) 1.25 MG (50000 UT) capsule Take 50,000 Units by mouth every 7 (seven) days. Every Sunday    [provider]  Evolocumab  (REPATHA ) 140 MG/ML SOSY Inject 140 mg into the skin See admin instructions. Inject 140 mg subcutaneously every other Wednesday evening    [provider]  fenofibrate  54 MG tablet Take 1 tablet (54 mg total) by mouth daily. 12/01/22   [name], Vinie BROCKS, MD  hydrALAZINE  (APRESOLINE ) 25 MG tablet Take 1.5 tablets (37.5 mg total) by mouth 3 (three) times daily. 01/31/23 05/01/23  Glena Harlene HERO, FNP  insulin  lispro (HUMALOG ) 100 UNIT/ML injection Inject 30-35 Units into the skin 3 (three) times daily before meals.    [provider]  Insulin  Pen Needle 32G X 4 MM MISC 1 Device by Does not apply route QID. For use with insulin  pens 02/13/22   Cindy Garnette POUR, MD  insulin  regular human CONCENTRATED (HUMULIN  R U-500 KWIKPEN) 500 UNIT/ML KwikPen Inject 185 Units into the skin 3 (three) times daily with meals. Patient taking differently: Inject 195 Units into the skin 3 (three) times daily with meals. 09/20/22 01/09/24  Ezenduka, Nkeiruka J, MD  ivabradine  (CORLANOR ) 7.5 MG TABS tablet Take 1 tablet (7.5 mg total) by mouth 2 (two) times daily with a meal. 01/31/23   Milford, Harlene HERO, FNP  levocetirizine (XYZAL ) 5 MG tablet Take 5 mg by mouth daily. 03/05/22   [provider]  levothyroxine  (SYNTHROID ) 137 MCG tablet Take 137 mcg by mouth daily. 09/20/22   [provider]  pantoprazole  (PROTONIX ) 40 MG tablet Take 40 mg by mouth daily. 12/24/19   [provider]  pregabalin  (LYRICA ) 300 MG capsule Take 300 mg by mouth at bedtime.    [provider]  prochlorperazine  (COMPAZINE ) 5 MG tablet Take 5 mg by mouth at bedtime.    [provider]  rosuvastatin  (CRESTOR ) 40 MG tablet Take 40 mg by mouth daily. 08/09/21   [provider]  sodium bicarbonate  650 MG tablet  Take 650 mg by mouth 2 (two) times daily.    [provider]  SUMAtriptan  (IMITREX ) 25 MG tablet Take 25 mg by mouth every 2 (two) hours as needed. 01/30/23   [provider]  topiramate  (TOPAMAX ) 50 MG tablet Take 50 mg by mouth 2 (two) times daily.    [provider]  torsemide  (DEMADEX ) 20 MG tablet Take 2 tablets (40 mg total) by mouth daily. May take 1 additional tablet as needed 10/05/22   [name], Toribio SAUNDERS, MD  tretinoin  (RETIN-A ) 0.05 % cream Apply 1 application topically at bedtime. 01/17/21   [provider]      Allergies    Icosapent  ethyl (epa ethyl ester) (fish), Ketamine, [place], Morphine , Penicillins, Clarithromycin, Erythromycin , Fentanyl , Penicillin v, Shellfish allergy, and Prednisone    Review of Systems   Review of Systems  Neurological:  Positive for numbness.  All other systems reviewed and are negative.   Physical Exam Updated Vital Signs BP (!) 160/88   Pulse 99   Temp 98.3 F (36.8 C) (Oral)   Resp (!) 21   LMP 03/22/2023   SpO2 95%  Physical Exam Vitals and nursing note reviewed.  Constitutional:      General: [name] not in acute distress.    Appearance: Normal appearance.  HENT:     Head: Normocephalic and atraumatic.     Mouth/Throat:     Mouth: Mucous membranes are moist.  Eyes:     Conjunctiva/sclera: Conjunctivae normal.     Pupils: Pupils are equal, round, and reactive to light.  Cardiovascular:     Rate and Rhythm: Normal rate and regular rhythm.     Pulses: Normal pulses.     Heart sounds: Normal heart sounds.  Pulmonary:     Effort: Pulmonary effort is normal.     Breath sounds: Normal breath sounds.  Abdominal:     Palpations: Abdomen is soft.     Tenderness: There is no abdominal tenderness.  Musculoskeletal:        General: Normal range of motion.  Skin:    General: Skin is warm and dry.     Findings: No rash.  Neurological:     General: No focal deficit present.     Mental Status: [name]  alert. Mental status is at baseline.     Sensory: No sensory deficit.     Motor: No weakness.  Psychiatric:        Mood and Affect: Mood normal.        Behavior: Behavior normal.    ED Results / Procedures / Treatments   Labs (all labs ordered are listed, but only abnormal results are displayed) Labs Reviewed  BASIC METABOLIC PANEL - Abnormal; Notable for the following components:      Result Value   Sodium 129 (*)    Potassium 7.5 (*)    CO2 19 (*)    Glucose, Bld 405 (*)  BUN 39 (*)    Creatinine, Ser 1.93 (*)    GFR, Estimated 35 (*)    All other components within normal limits  URINALYSIS, ROUTINE W REFLEX MICROSCOPIC - Abnormal; Notable for the following components:   Glucose, UA >1,000 (*)    Protein, ur 30 (*)    Bacteria, UA RARE (*)    All other components within normal limits  BASIC METABOLIC PANEL - Abnormal; Notable for the following components:   Sodium 128 (*)    Potassium 6.3 (*)    CO2 21 (*)    Glucose, Bld 492 (*)    BUN 42 (*)    Creatinine, Ser 2.05 (*)    GFR, Estimated 33 (*)    All other components within normal limits  CBG MONITORING, ED - Abnormal; Notable for the following components:   Glucose-Capillary 316 (*)    All other components within normal limits  RESP PANEL BY RT-PCR (RSV, FLU A&B, COVID)  RVPGX2  MAGNESIUM   CBC WITH DIFFERENTIAL/PLATELET  HCG, SERUM, QUALITATIVE  TROPONIN I (HIGH SENSITIVITY)    EKG EKG Interpretation Date/Time:  Monday April 10 2023 12:35:57 EST Ventricular Rate:  116 PR Interval:  136 QRS Duration:  129 QT Interval:  361 QTC Calculation: 502 R Axis:   22  Text Interpretation: Sinus tachycardia Anterior infarct, acute (LAD) Prolonged QT interval Baseline wander in lead(s) I II aVR aVF V2 V3 Baseline wander TECHNICALLY DIFFICULT Otherwise no significant change Confirmed by Emil Share [phone]) on 04/10/2023 12:48:21 PM  Radiology DG Chest Portable 1 View Result Date: 04/10/2023 CLINICAL DATA:  Fall.   Numbness and tingling. EXAM: PORTABLE CHEST 1 VIEW COMPARISON:  01/17/2023. FINDINGS: Low lung volume. Bilateral lung fields are clear. Bilateral costophrenic angles are clear. Stable cardio-mediastinal silhouette. Redemonstration of metallic clip overlying the left lower lung zone. No acute osseous abnormalities. The soft tissues are within normal limits. IMPRESSION: *No active disease. Electronically Signed   By: Ree Molt M.D.   On: 04/10/2023 14:23   CT Head Wo Contrast Result Date: 04/10/2023 CLINICAL DATA:  Provided history: History of brain tumor. Numbness and tingling all over body. Fall. EXAM: CT HEAD WITHOUT CONTRAST TECHNIQUE: Contiguous axial images were obtained from the base of the skull through the vertex without intravenous contrast. RADIATION DOSE REDUCTION: This exam was performed according to the departmental dose-optimization program which includes automated exposure control, adjustment of the mA and/or kV according to patient size and/or use of iterative reconstruction technique. COMPARISON:  Prior head CT examinations 09/05/2022 and earlier. Brain MRI 12/06/2018. FINDINGS: Please note, there is limited assessment for residual/recurrent tumor on this non-contrast head CT. Brain: No age-advanced or lobar predominant parenchymal atrophy. Foci of chronic encephalomalacia/gliosis within the anterior frontal lobes bilaterally. Ill-defined focus of hyperdensity at the level of the midbrain, unchanged from the prior head CT of 09/05/2022. Chronic blood products were present at this site on the prior brain MRI of 12/06/2018. There is no acute intracranial hemorrhage. No demarcated cortical infarct. No extra-axial fluid collection. No midline shift. Vascular: No hyperdense vessel.  Atherosclerotic calcifications. Skull: Post-operative changes from prior right temporal craniotomy. Sinuses/Orbits: No mass or acute finding within the imaged orbits. No significant paranasal sinus disease at the  imaged levels. IMPRESSION: 1. No acute intracranial hemorrhage or evidence of an acute infarct. 2. Ill-defined focus of hyperdensity at the level of the midbrain, unchanged from the prior head CT of 09/05/2022. This likely corresponds with the chronic blood products demonstrated at this site on the  prior brain MRI of 12/06/2018. Please note, assessment for residual/recurrent tumor is limited on this non-contrast head CT. 3. Foci of chronic encephalomalacia/gliosis again noted within the anterior frontal lobes bilaterally, likely postsurgical/postprocedural. Electronically Signed   By: Rockey Childs D.O.   On: 04/10/2023 14:03    Procedures .Critical Care  Performed by: Waddell Sluder, PA-C Authorized by: Waddell Sluder, PA-C   Critical care provider statement:    Critical care time (minutes):  35   Critical care was necessary to treat or prevent imminent or life-threatening deterioration of the following conditions:  Metabolic crisis   Critical care was time spent personally by me on the following activities:  Blood draw for specimens, ordering and performing treatments and interventions, ordering and review of laboratory studies, development of treatment plan with patient or surrogate, discussions with consultants, ordering and review of radiographic studies, pulse oximetry, re-evaluation of patient's condition, review of old charts, examination of patient and evaluation of patient's response to treatment   Care discussed with: admitting provider       Medications Ordered in ED Medications  0.9 %  sodium chloride  infusion ( Intravenous New Bag/Given 04/10/23 1558)  calcium  gluconate 1 g/ 50 mL sodium chloride  IVPB (1,000 mg Intravenous New Bag/Given 04/10/23 1739)  labetalol  (NORMODYNE ) injection 20 mg (20 mg Intravenous Given 04/10/23 1331)  furosemide  (LASIX ) injection 40 mg (40 mg Intravenous Given 04/10/23 1333)  sodium zirconium cyclosilicate  (LOKELMA ) packet 10 g (10 g Oral Given 04/10/23 1332)   albuterol  (PROVENTIL ) (2.5 MG/3ML) 0.083% nebulizer solution 10 mg (10 mg Nebulization Given 04/10/23 1329)  insulin  aspart (novoLOG ) injection 5 Units (5 Units Intravenous Given 04/10/23 1356)    And  dextrose  50 % solution 50 mL (50 mLs Intravenous Given 04/10/23 1332)  calcium  gluconate 1 g/ 50 mL sodium chloride  IVPB (0 mg Intravenous Stopped 04/10/23 1500)  diazepam  (VALIUM ) injection 5 mg (5 mg Intravenous Given 04/10/23 1505)  furosemide  (LASIX ) injection 40 mg (40 mg Intravenous Given 04/10/23 1559)  insulin  aspart (novoLOG ) injection 5 Units (5 Units Intravenous Given 04/10/23 1736)  sodium zirconium cyclosilicate  (LOKELMA ) packet 10 g (10 g Oral Given 04/10/23 1737)  albuterol  (PROVENTIL ) (2.5 MG/3ML) 0.083% nebulizer solution 2.5 mg (2.5 mg Nebulization Given 04/10/23 1656)    ED Course/ Medical Decision Making/ A&P                                 Medical Decision Making Amount and/or Complexity of Data Reviewed Labs: ordered. Radiology: ordered.  Risk OTC drugs. Prescription drug management. Decision regarding hospitalization.   This patient presents to the ED for concern of numbness/tingling, this involves an extensive number of treatment options, and is a complaint that carries with it a high risk of complications and morbidity.   Differential diagnosis includes: dehydration, electrolyte derangement, stroke, TIA, brain lesion, neuropathy, hypoglycemia, atypical migraine, etc.   Comorbidities  See HPI above   Additional History  Additional history obtained from prior records.   Cardiac Monitoring / EKG  The patient was maintained on a cardiac monitor.  I personally viewed and interpreted the cardiac monitored which showed: sinus tachycardia with a heart rate of 116 bpm.   Lab Tests  I ordered and personally interpreted labs.  The pertinent results include:   Potassium of 7.5, glucose of 405, BUN of 39, Cr of 1.93, GFR of 35, otherwise BMP is within normal  limits Negative troponin Magnesium  and CBC are unremarkable  Imaging Studies  I ordered imaging studies including CXR and CT head  I independently visualized and interpreted imaging which showed:  CXR shows no active disease. CT head shows no acute intracranial hemorrhage or evidence of acute infarct.  I agree with the radiologist interpretation   Consultations  I requested consultation with Dr. Sherlon,  and discussed lab and imaging findings as well as pertinent plan - they recommend: Give 40mg  Lasix  and fluids and repeat BMP. He will accept patient for admission.   Problem List / ED Course / Critical Interventions / Medication Management  Generalized numbness/tingling for the past 3 days. She states that she does not believe her short acting insulin  is working and is concerned her blood sugar is out of control.  She denies any fevers, nausea, vomiting, or diarrhea.  No headaches, vision changes, or slurred speech.  [name] shaky with movement but denies dizziness.  Denies any recent head injury or trauma. On initial evaluation patient's blood pressure was 190/120.  She states that she took all of her morning medications as prescribed.  [name] also tachycardic in the 120s, which she states is normal for her.   Patient has a history of astrocytoma that was removed years ago. She has left sided weakness since the procedure.  Today she endorses generalized weakness.  No new focal neurologic deficits were appreciated. Patient told the nurse that she was having chest pain, which she denied to both me or my attending, Dr. Emil.  ACS workup was reassuring. Head CT was done due to patient's extensive medical history, which ultimately came back negative.  I ordered medications including: Labetalol  for hypertension Insulin , Albuterol , Lasix , and Lokelma  for hyperkalemia   Reevaluation of the patient after these medicines showed that the patient improved.  Blood pressure came down to 160/88.   Pulse of 99 bpm. Patients repeat BMP has a potassium of 6.3. I spoke with Dr. Sherlon (accepting physician) who advised giving patient more calcium  gluconate as well as insulin . Random CBG of 316. I have reviewed the patients home medicines and have made adjustments as needed   Social Determinants of Health  Physical activity   Test / Admission - Considered  Discussed findings with patient. All questions were answered. [name] agreeable with plan for admission.        Final Clinical Impression(s) / ED Diagnoses Final diagnoses:  Hyperkalemia  Hypertension, unspecified type    Rx / DC Orders ED Discharge Orders     None         Waddell Sluder, PA-C 04/10/23 1828    Emil Share, DO 04/14/23 [phone]

## 2023-04-10 NOTE — Assessment & Plan Note (Addendum)
-   Secondary to AKI and hyperglycemia - Follow trend with continuous IV fluids -Continue home sodium bicarb

## 2023-04-10 NOTE — Assessment & Plan Note (Signed)
-  secondary to hyperglycemia

## 2023-04-10 NOTE — Assessment & Plan Note (Addendum)
-  severe hyperkalemia of 7.5 with peaked T waves seen on EKG -likely secondary to AKI  -given IV calcium  gluconate, albuterol  nebulizer, insulin , Lokelma , Lasix  IV 40mg  x 2. Repeat Potassium improved to 5.1.  -will administer her home nighttime insulin  as she is on high dose with U500 and Humalog  -Follow potassium trend

## 2023-04-10 NOTE — Progress Notes (Signed)
 33 year old female with known history of astrocytoma, CHF, hypertriglyceridemia and diabetes mellitus, she presents to ED secondary to 3 days of complaints of tingling and numbness, CT head with no acute findings (other than known findings related to her astrocytoma), her workup significant for severe hyperkalemia at 7.5, EKG with some peaked T waves, she received IV Lasix , Lokelma , IV insulin  with D50, cussed with ED, will start on IV fluids, and give more Lasix .  Repeat BMP is pending ,patient has been accepted to progressive care unit. Brayton Lye MD

## 2023-04-10 NOTE — Assessment & Plan Note (Signed)
-   Continue home Cymbalta and Lyrica

## 2023-04-10 NOTE — Assessment & Plan Note (Signed)
-  creatinine of 2.05 up from 1.75. -keep on continuous IV fluid. [name] on diuretics on home which she held today. However she did receive IV 40mg  Lasix x 2 for her hyperkalemia. Will hold any additional diuretics at this time

## 2023-04-10 NOTE — ED Notes (Signed)
 RT Note: Patient was placed on 10mg  Albuterol due to an elevated Potassium

## 2023-04-10 NOTE — ED Notes (Signed)
 Pt called out to reports chest pressure. EKG performed and handed to EDP [facility]

## 2023-04-10 NOTE — ED Triage Notes (Signed)
 Pt reports numbness and tingling "all over my body" for the whole weekend.  Reports falling today. Pt denies head injury, denies injury r/t fall. Pt ambulatory to room. Speech clear, pt aox4

## 2023-04-10 NOTE — Assessment & Plan Note (Signed)
 Elevated - Improved with dose of IV labetalol in the ED -Continue home hydralazine TID, Coreg BID

## 2023-04-10 NOTE — ED Notes (Signed)
 Attempted to call report to 5W at [facility]. Secretary answered the phone and this RN was told the nurse hasn't looked over this patient yet. I explained that was fine, and could the nurse just call me back? Secretary stated no, can you just call back in about an hour? Or actually make it two. I replied that the patient was coming from an ED and that [facility] was already on the way and that report could not wait for 2 hours. Secretary then stated Well, I don't know what to tell you then.  Asked to speak with charge nurse and report was given to [facility].

## 2023-04-10 NOTE — Assessment & Plan Note (Signed)
-  Continue levothyroxine.  -Repeat TSH although paresthesia likely secondary to hyperkalemia

## 2023-04-10 NOTE — Assessment & Plan Note (Signed)
-  Last TTE on 09/2022 with EF of 50 to 55%, indeterminate diastolic parameters.  No significant valvular abnormalities.

## 2023-04-10 NOTE — ED Notes (Signed)
 Thomas with cl called for consult

## 2023-04-10 NOTE — Assessment & Plan Note (Signed)
BMI of 30

## 2023-04-10 NOTE — Assessment & Plan Note (Signed)
-  Last A1C of 6.9.  -Home regimen:  U-500- 200 U for breakfast, 195 U for lunch, 180 U for dinner  -will keep on 180 units TID with meals here Humalog - 45 units for breakfast and  30 units for lunch and dinner - keep on 30 units TID with meals here

## 2023-04-10 NOTE — Assessment & Plan Note (Signed)
-   Continue statin, fenofibrate -[name] also on Repatha injections.  Currently following with cardiology and may start on a new investigative drug.

## 2023-04-11 DIAGNOSIS — E872 Acidosis, unspecified: Secondary | ICD-10-CM | POA: Diagnosis not present

## 2023-04-11 DIAGNOSIS — E875 Hyperkalemia: Secondary | ICD-10-CM | POA: Diagnosis not present

## 2023-04-11 DIAGNOSIS — I130 Hypertensive heart and chronic kidney disease with heart failure and stage 1 through stage 4 chronic kidney disease, or unspecified chronic kidney disease: Secondary | ICD-10-CM | POA: Diagnosis not present

## 2023-04-11 DIAGNOSIS — D649 Anemia, unspecified: Secondary | ICD-10-CM | POA: Diagnosis not present

## 2023-04-11 DIAGNOSIS — I509 Heart failure, unspecified: Secondary | ICD-10-CM | POA: Diagnosis not present

## 2023-04-11 DIAGNOSIS — N184 Chronic kidney disease, stage 4 (severe): Secondary | ICD-10-CM | POA: Diagnosis not present

## 2023-04-11 DIAGNOSIS — R531 Weakness: Secondary | ICD-10-CM | POA: Diagnosis not present

## 2023-04-11 LAB — T4, FREE: Free T4: 0.52 ng/dL — ABNORMAL LOW (ref 0.61–1.12)

## 2023-04-11 LAB — GLUCOSE, CAPILLARY
Glucose-Capillary: 117 mg/dL — ABNORMAL HIGH (ref 70–99)
Glucose-Capillary: 48 mg/dL — ABNORMAL LOW (ref 70–99)
Glucose-Capillary: 134 mg/dL — ABNORMAL HIGH (ref 70–99)
Glucose-Capillary: 63 mg/dL — ABNORMAL LOW (ref 70–99)
Glucose-Capillary: 116 mg/dL — ABNORMAL HIGH (ref 70–99)
Glucose-Capillary: 121 mg/dL — ABNORMAL HIGH (ref 70–99)
Glucose-Capillary: 112 mg/dL — ABNORMAL HIGH (ref 70–99)
Glucose-Capillary: 110 mg/dL — ABNORMAL HIGH (ref 70–99)

## 2023-04-11 LAB — TSH: TSH: 14.829 u[IU]/mL — ABNORMAL HIGH (ref 0.350–4.500)

## 2023-04-11 LAB — BASIC METABOLIC PANEL
Anion gap: 14 (ref 5–15)
BUN: 49 mg/dL — ABNORMAL HIGH (ref 6–20)
CO2: 21 mmol/L — ABNORMAL LOW (ref 22–32)
Calcium: 9.7 mg/dL (ref 8.9–10.3)
Chloride: 106 mmol/L (ref 98–111)
Creatinine, Ser: 2.4 mg/dL — ABNORMAL HIGH (ref 0.44–1.00)
GFR, Estimated: 27 mL/min — ABNORMAL LOW (ref >60)
Glucose, Bld: 50 mg/dL — ABNORMAL LOW (ref 70–99)
Potassium: 3.7 mmol/L (ref 3.5–5.1)
Sodium: 141 mmol/L (ref 135–145)

## 2023-04-11 LAB — CBC
HCT: 35.9 % — ABNORMAL LOW (ref 36.0–46.0)
Hemoglobin: 12.8 g/dL (ref 12.0–15.0)
MCH: 32.7 pg (ref 26.0–34.0)
MCHC: 35.7 g/dL (ref 30.0–36.0)
MCV: 91.6 fL (ref 80.0–100.0)
Platelets: 167 10*3/uL (ref 150–400)
RBC: 3.92 MIL/uL (ref 3.87–5.11)
RDW: 15.3 % (ref 11.5–15.5)
WBC: 8.6 10*3/uL (ref 4.0–10.5)
nRBC: 0 % (ref 0.0–0.2)

## 2023-04-11 LAB — POTASSIUM: Potassium: 4.4 mmol/L (ref 3.5–5.1)

## 2023-04-11 MED ORDER — SODIUM ZIRCONIUM CYCLOSILICATE 5 G PO PACK
5.0000 g | PACK | Freq: Once | ORAL | Status: AC
  Administered 2023-04-11: 5 g via ORAL
  Filled 2023-04-11: qty 1

## 2023-04-11 MED ORDER — SODIUM CHLORIDE 0.9 % IV SOLN: INTRAVENOUS | Status: AC

## 2023-04-11 MED ORDER — DEXTROSE 50 % IV SOLN
50.0000 mL | INTRAVENOUS | Status: DC | PRN
  Administered 2023-04-12: 50 mL via INTRAVENOUS
  Filled 2023-04-11: qty 50

## 2023-04-11 MED ORDER — DIAZEPAM 5 MG/ML IJ SOLN
2.5000 mg | Freq: Once | INTRAMUSCULAR | Status: AC
Start: 2023-04-11 — End: 2023-04-11
  Administered 2023-04-11: 2.5 mg via INTRAVENOUS
  Filled 2023-04-11: qty 2

## 2023-04-11 MED ORDER — INSULIN ASPART 100 UNIT/ML IJ SOLN: 20.0000 [IU] | Freq: Two times a day (BID) | INTRAMUSCULAR | Status: DC

## 2023-04-11 NOTE — Progress Notes (Signed)
 Received  Valium 10 mg per 2 ml from pharmacy.  2.5 mg (0.5 ml) given to the patient at 0034 and remaining 7.5 mg (1.5 ml) wasted with RN Tonna Boehringer.

## 2023-04-11 NOTE — Consult Note (Addendum)
 Jennifer [name]  Requesting MD: Jaclynn Nest Reason for consult: Hyperkalemia  HPI:  Jennifer [name] is a 33 y.o. female with past medical history significant for insulin -dependent diabetes, pancreatic insufficiency, familial hypertriglyceridemia, nonischemic cardiomyopathy, hypertension, history of astrocytoma s/p resection and chemo, CHF, CKD 4 who was initially presented with generalized weakness, fall seen as a consultation for the evaluation and management of hyperkalemia and CKD. For CKD, patient follows with nephrologist at [facility] with a baseline serum creatinine level fluctuated around 1.7-2.3.  The patient had kidney biopsy with a diagnosis of hypertensive nephrosclerosis, early changes of FSGS and diabetic glomerulosclerosis.  There was discussion about initiating dialysis however GFR remained above 20. She has diabetes since her young age with fluctuation in blood sugar level.  She also had multiple episodes of hyperkalemia as high as 7.5 in the past. During this admission, she was found to have potassium level of 7.5, CO2 19, blood sugar level 405, BUN 39, creatinine level 1.93, triglycerides 1609.  The patient was treated medically with IV fluid, diuretics, Aldactone , bicarb with gradual improvement of potassium level to 3.7 today and a creatinine level around 2.4.  She was also on sodium bicarbonate  for metabolic acidosis. For CHF, she was put back on spironolactone  25 mg around 01/2023.  [name] also on torsemide . It seems like she is not on ACE inhibitor or ARB. The patient denies nausea, vomiting, chest pain or shortness of breath.  No peripheral edema. She lives in [place] and she wants to move her nephrology care locally.  PMHx:   Past Medical History:  Diagnosis Date   Afib (HCC) 05/12/2021   Brain tumor (HCC) 03/29/1995   astrocytoma   CHF (congestive heart failure) (HCC)    Cholesterosis    CKD (chronic kidney  disease) stage 4, GFR 15-29 ml/min (HCC) 05/13/2021   DM (diabetes mellitus) (HCC) 10/10/2018   Fatty liver    HTN (hypertension) 10/10/2018   Hypothyroidism 10/10/2018   Lipoprotein deficiency    Lung disease    longevity long term   Pancreatitis    Polycystic ovary syndrome     Past Surgical History:  Procedure Laterality Date   ABDOMINAL SURGERY     pt states during miscarriage got her intestine   BRAIN SURGERY     EYE MUSCLE SURGERY Right 03/28/2014   PRESSURE SENSOR/CARDIOMEMS N/A 12/06/2021   Procedure: PRESSURE SENSOR/CARDIOMEMS;  Surgeon: [name] Toribio SAUNDERS, MD;  Location: [facility] INVASIVE CV LAB;  Service: Cardiovascular;  Laterality: N/A;   RIGHT HEART CATH N/A 08/05/2021   Procedure: RIGHT HEART CATH;  Surgeon: [name] Toribio SAUNDERS, MD;  Location: [facility] INVASIVE CV LAB;  Service: Cardiovascular;  Laterality: N/A;   RIGHT HEART CATH N/A 12/06/2021   Procedure: RIGHT HEART CATH;  Surgeon: [name] Toribio SAUNDERS, MD;  Location: [facility] INV[facility] CV [facility];  Service: Cardiovascular;  Laterality: N/A;   RIGHT/LEFT HEART CATH AND CORONARY ANGIOGRAPHY N/A 12/03/2020   Procedure: RIGHT/LEFT HEART CATH AND CORONARY ANGIOGRAPHY;  Surgeon: Ladona Heinz, MD;  Location: [facility] CV [facility];  Service: Cardiovascular;  Laterality: N/A;   VENTRICULOSTOMY  03/28/1997    Family Hx:  Family History  Problem Relation Age of Onset   Diabetes Mother    Hypertension Mother    Hyperlipidemia Mother    Thyroid  disease Mother    Hypertension Father    Diabetes Father    Pancreatic cancer Paternal Aunt    Pancreatic cancer Paternal Uncle    Colon cancer Neg Hx  Esophageal cancer Neg Hx    Stomach cancer Neg Hx     Social History:  reports that she has never smoked. She has never used smokeless tobacco. She reports that she does not drink alcohol  and does not use drugs.  Allergies:  Allergies  Allergen Reactions   Icosapent  Ethyl (Epa Ethyl Ester) (Fish)     Hives w/Vascepa    Ketamine Other (See Comments)     In vegetative state for 15 minutes per pt   [place] Itching    Itchy throat  Itchy throat Itchy throat Itchy throat Itchy throat    Itchy throat  Itchy throat Itchy throat Itchy throat Itchy throat    Itchy throat   Morphine  Itching, Rash and Hives    Sorethroat   Penicillins Hives, Itching and Rash    Has patient had a PCN reaction causing immediate rash, facial/tongue/throat swelling, SOB or lightheadedness with hypotension: Y Has patient had a PCN reaction causing severe rash involving mucus membranes or skin necrosis: Y Has patient had a PCN reaction that required hospitalization: N Has patient had a PCN reaction occurring within the last 10 years: Y   Clarithromycin Other (See Comments)    Stomach pain   Erythromycin      Stomach pain   Fentanyl  Nausea And Vomiting   Penicillin V Hives, Itching and Other (See Comments)    Gi upset, also   Shellfish Allergy Other (See Comments)    Pt has never had shellfish but tested positive on allergy test. Pt states contrast in CT is okay   Prednisone Rash    Medications: Prior to Admission medications   Medication Sig Start Date End Date Taking? Authorizing Provider  amitriptyline  (ELAVIL ) 10 MG tablet Take 10 mg by mouth at bedtime.   Yes [provider]  aspirin  EC 81 MG EC tablet Take 1 tablet (81 mg total) by mouth daily. Swallow whole. 12/07/20  Yes Fausto Sor A, DO  azelastine  (ASTELIN ) 0.1 % nasal spray Place 2 sprays into both nostrils daily at 6 (six) AM.   Yes [provider]  BLISOVI  FE 1.5/30 1.5-30 MG-MCG tablet Take 1 tablet by mouth daily.   Yes [provider]  butalbital -acetaminophen -caffeine  (FIORICET ) 50-325-40 MG tablet Take 1 tablet by mouth daily as needed for headache. 09/26/22  Yes [provider]  carvedilol  (COREG ) 3.125 MG tablet Take 1 tablet (3.125 mg total) by mouth 2 (two) times daily. 10/04/22  Yes [name], Daniel R, MD  CRANBERRY CONCENTRATE PO Take 2 tablets by  mouth 2 (two) times daily.   Yes [provider]  CREON  36000-114000 units CPEP capsule Take 72,000 Units by mouth 3 (three) times daily.   Yes [provider]  DULoxetine  (CYMBALTA ) 60 MG capsule Take 60 mg by mouth at bedtime. 09/26/22  Yes [provider]  ergocalciferol  (VITAMIN D2) 1.25 MG (50000 UT) capsule Take 50,000 Units by mouth every 7 (seven) days. Every Sunday   Yes [provider]  fenofibrate  54 MG tablet Take 1 tablet (54 mg total) by mouth daily. 12/01/22  Yes [name], Vinie BROCKS, MD  hydrALAZINE  (APRESOLINE ) 25 MG tablet Take 1.5 tablets (37.5 mg total) by mouth 3 (three) times daily. 01/31/23 05/01/23 Yes Milford, Harlene HERO, FNP  insulin  lispro (HUMALOG ) 100 UNIT/ML injection Inject 30-45 Units into the skin 3 (three) times daily before meals. Inject subcutaneously 45 units with breakfast, 30 units with lunch and 30 units with dinner   Yes [provider]  insulin  regular human CONCENTRATED (  HUMULIN  R U-500 KWIKPEN) 500 UNIT/ML KwikPen Inject 185 Units into the skin 3 (three) times daily with meals. Patient taking differently: Inject 185-195 Units into the skin 3 (three) times daily with meals. Inject subcutaneously 195 units with breakfast and lunch and 185 units with dinner 09/20/22 01/09/24 Yes Ezenduka, Nkeiruka J, MD  ivabradine  (CORLANOR ) 7.5 MG TABS tablet Take 1 tablet (7.5 mg total) by mouth 2 (two) times daily with a meal. 01/31/23  Yes Milford, Harlene HERO, FNP  levocetirizine (XYZAL ) 5 MG tablet Take 5 mg by mouth daily. 03/05/22  Yes [provider]  levothyroxine  (SYNTHROID ) 137 MCG tablet Take 137 mcg by mouth daily. 09/20/22  Yes [provider]  pantoprazole  (PROTONIX ) 40 MG tablet Take 40 mg by mouth daily. 12/24/19  Yes [provider]  pregabalin  (LYRICA ) 300 MG capsule Take 300 mg by mouth 2 (two) times daily.   Yes [provider]  prochlorperazine  (COMPAZINE ) 5 MG tablet Take 5 mg by mouth at  bedtime.   Yes [provider]  rosuvastatin  (CRESTOR ) 40 MG tablet Take 40 mg by mouth daily. 08/09/21  Yes [provider]  sodium bicarbonate  650 MG tablet Take 1,300 mg by mouth 2 (two) times daily.   Yes [provider]  SUMAtriptan  (IMITREX ) 25 MG tablet Take 25 mg by mouth every 2 (two) hours as needed. 01/30/23  Yes [provider]  topiramate  (TOPAMAX ) 50 MG tablet Take 50 mg by mouth 2 (two) times daily.   Yes [provider]  torsemide  (DEMADEX ) 20 MG tablet Take 2 tablets (40 mg total) by mouth daily. May take 1 additional tablet as needed 10/05/22  Yes [name], Toribio SAUNDERS, MD  tretinoin  (RETIN-A ) 0.05 % cream Apply 1 application topically at bedtime. 01/17/21  Yes [provider]  Evolocumab  (REPATHA ) 140 MG/ML SOSY Inject 140 mg into the skin every Saturday. Inject 140 mg subcutaneously every other Wednesday evening    [provider]  Insulin  Pen Needle 32G X 4 MM MISC 1 Device by Does not apply route QID. For use with insulin  pens 02/13/22   Cindy Garnette POUR, MD    I have reviewed the patient's current medications.  Labs: Renal Panel: Recent Labs  Lab 04/10/23 1229 04/10/23 1511 04/10/23 2032 04/11/23 0422  NA 129* 128* 134* 141  K 7.5* 6.3* 5.1 3.7  CL 100 98 102 106  CO2 19* 21* 19* 21*  GLUCOSE 405* 492* 290* 50*  BUN 39* 42* 45* 49*  CREATININE 1.93* 2.05* 2.10* 2.40*  CALCIUM  9.9 10.0 10.3 9.7  MG 1.7  --   --   --      CBC:    Latest Ref Rng & Units 04/11/2023    4:22 AM 04/10/2023   12:29 PM 01/20/2023   12:39 PM  CBC  WBC 4.0 - 10.5 K/uL 8.6  7.0  6.5   Hemoglobin 12.0 - 15.0 g/dL 87.1  86.5  88.8   Hematocrit 36.0 - 46.0 % 35.9  39.6  29.9   Platelets 150 - 400 K/uL 167  151  220      Anemia Panel:  Recent Labs    06/02/22 1538 06/03/22 0339 01/18/23 1004 01/19/23 0032 01/20/23 1239 01/20/23 1240 04/10/23 1229 04/11/23 0422  HGB  --    < > 11.9* 9.0* 11.1*  --  13.4 12.8  MCV   --    < >  --  91.2 92.0  --  91.0 91.6  VITAMINB12 1,521*  --   --   --   --   --   --   --  FOLATE 13.7  --   --   --   --   --   --   --   FERRITIN 343*  --   --   --   --   --   --   --   TIBC 245*  --   --   --   --   --   --   --   IRON 66  --   --   --   --   --   --   --   RETICCTPCT 1.7  --   --   --   --  5.4*  --   --    < > = values in this interval not displayed.    No results for input(s): AST, ALT, ALKPHOS, BILITOT, PROT, ALBUMIN  in the last 168 hours.  Lab Results  Component Value Date   HGBA1C 6.9 (H) 01/02/2023    ROS:  Pertinent items noted in HPI and remainder of comprehensive ROS otherwise negative.  Physical Exam: Vitals:   04/11/23 0810 04/11/23 1225  BP: 139/74 (!) 171/83  Pulse: 96 92  Resp: 16 18  Temp: 98 F (36.7 C) 98 F (36.7 C)  SpO2: 98%      General exam: Appears calm and comfortable  Respiratory system: Clear to auscultation. Respiratory effort normal. No wheezing or crackle Cardiovascular system: S1 & S2 heard, RRR.  No pedal edema. Gastrointestinal system: Abdomen is nondistended, soft and nontender. Normal bowel sounds heard. Central nervous system: Alert and oriented. No focal neurological deficits. Extremities: No LE edema. Skin: No rashes, lesions or ulcers Psychiatry: Judgement and insight appear normal. Mood & affect appropriate.   Assessment/Plan:  # CKD 4 with biopsy-proven hypertensive nephrosclerosis, early FSGS and diabetic nephropathy follows with Dr.Wyatt, Tawni at [facility] B/l cr 1.7-2.3 mostly. The volume status acceptable and patient has no signs or symptoms of uremia.  No need for dialysis.  Patient like to move her nephrology care locally therefore I will send message to [facility] office to follow-up after discharge from the hospital.  # Hyperkalemia presumably multifactorial etiology including use of spironolactone  in CKD patient, hyperglycemia.  She may also have type IV RTA due to longstanding  diabetes and differentials also include hyperkalemic periodic paralysis. The patient received IV fluid, diuretics and Lokelma  with improvement of serum potassium level.  Discussed with the patient to stop Aldactone  completely.  Patient is not on ACE inhibitor or ARB.  Recommend low potassium diet.  # Metabolic acidosis presumed due to CKD and use of Topamax .  Agree with continuing sodium bicarbonate .  # Chronic CHF, with preserved EF: Continue cardiac medication however recommend to avoid ACE inhibitor, ARB or spironolactone  because of intermittent severe hyperkalemia.  She looks well compensated.  # Hypertriglyceridemia: Managed by statin, fenofibrate  and Repatha .  I believe she follows at lipid clinic w/cardiology.  Discussed with the primary team.  Thank you for the consult.  We will arrange outpatient follow-up.  Please call us  back with question.   Yelena Metzer Amelie Romney 04/11/2023, 1:36 PM  [employer].

## 2023-04-11 NOTE — Progress Notes (Addendum)
 PROGRESS NOTE                                                                                                                                                                                                             Patient Demographics:    Jennifer [name], is a 33 y.o. female, DOB - 07-Feb-1991, FMW:[id number]  Outpatient Primary MD for the patient is [name]    LOS - 1  Admit date - 04/10/2023    Chief Complaint  Patient presents with   Numbness       Brief Narrative (HPI from H&P)   33 y.o. female with medical history significant of Hyperchylomicronemia, HFrEF, pancreatitis, astrocytoma, insulin -dependent type 2 diabetes with gastroparesis and peripheral neuropathy, hypertension, CKD 3B, pancreatic insufficiency who presented to outside ED with generalized paresthesia, weakness and fall and found to have severe life-threatening hyperkalemia.    Subjective:    [name] today has, No headache, No chest pain, No abdominal pain - No Nausea, No new weakness tingling or numbness, no SOB   Assessment  & Plan :    Severe life-threatening hyperkalemia does not on admission, hyperkalemia seems to be somewhat chronic and recurrent She was treated with hyperkalemia protocol, potassium is within normal limits now, continue monitoring, will request pharmacy to evaluate home medications, preliminary review does not show any possible offending medications on board, she does have underlying CKD 3B and diabetes mellitus type 2, question if she has RTA 4.  Already on diuretic at home, will involve nephrology as well for long-term outpatient plan as her hyperkalemia seems to be recurrent.  Acute kidney injury superimposed on stage 3b chronic kidney disease (HCC) Baseline creatinine around 1.8, hydrate and monitor hold further diuretics.  Continue oral bicarb.  Metabolic acidosis - Due to AKI, continue bicarb and  monitor  Hypothyroidism -Continue levothyroxine .  -Check TSH and free T4 question if she has central hypothyroidism due to astrocytoma and related surgery.  HX of astrocytoma with resection and mild residual left-sided hemiparesis and right-sided ptosis.  Supportive care.  Outpatient follow-up with PCP.  He is now back to her baseline.  HTN (hypertension) Elevated - Improved with dose of IV labetalol  in the ED -Continue home hydralazine  TID, Coreg  BID   Chronic diastolic CHF (congestive heart failure) (HCC) -Last TTE on 09/2022 with EF of  50 to 55%, indeterminate diastolic parameters.  No significant valvular abnormalities.  Currently compensated on Coreg  and ivabradine , not on ACE ARB or Entresto  due to underlying renal insufficiency and hyperkalemia.  HX of pancreatic insufficiency.  On pancreatic supplements.    Peripheral neuropathy - Continue home Cymbalta  and Lyrica   Hyperchylomicronemia - Continue statin, fenofibrate  -[name] also on Repatha  injections.  Currently following with cardiology and may start on a new investigative drug.  Obesity (BMI 30-39.9) - BMI of 30 follow with PCP for weight loss  Type 2 diabetes mellitus with hyperglycemia, with long-term current use of insulin  (HCC) -Last A1C of 6.9.  Home regimen for insulin  noted, she takes very high doses of Humulin  R U-500 185 units 3 times a day along with high-dose sliding scale.  Monitor and adjust.  CBG (last 3)  Recent Labs    04/11/23 0409 04/11/23 0436 04/11/23 0518  GLUCAP 48* 63* 134*            Condition - Fair  Family Communication  :  None  Code Status :   Full  Consults  :  Renal  PUD Prophylaxis :  PPI   Procedures  :     CT - 1. No acute intracranial hemorrhage or evidence of an acute infarct. 2. Ill-defined focus of hyperdensity at the level of the midbrain, unchanged from the prior head CT of 09/05/2022. This likely corresponds with the chronic blood products demonstrated at this  site on the prior brain MRI of 12/06/2018. Please note, assessment for residual/recurrent tumor is limited on this non-contrast head CT. 3. Foci of chronic encephalomalacia/gliosis again noted within the anterior frontal lobes bilaterally, likely postsurgical/postprocedural.      Disposition Plan  :    Status is: Inpatient  DVT Prophylaxis  :    enoxaparin  (LOVENOX ) injection 40 mg Start: 04/10/23 2200   Lab Results  Component Value Date   PLT 167 04/11/2023    Diet :  Diet Order             Diet heart healthy/carb modified Room service appropriate? Yes; Fluid consistency: Thin  Diet effective now                    Inpatient Medications  Scheduled Meds:  amitriptyline   10 mg Oral QHS   aspirin  EC  81 mg Oral Daily   azelastine   2 spray Each Nare BID   carvedilol   3.125 mg Oral BID   DULoxetine   60 mg Oral QHS   enoxaparin  (LOVENOX ) injection  40 mg Subcutaneous Q24H   fenofibrate   54 mg Oral Daily   hydrALAZINE   37.5 mg Oral TID   insulin  aspart  30 Units Subcutaneous BID AC   insulin  regular human CONCENTRATED  180 Units Subcutaneous QPC supper   insulin  regular human CONCENTRATED  195 Units Subcutaneous BID WC   ivabradine   7.5 mg Oral BID WC   levothyroxine   137 mcg Oral Q0600   lipase/protease/amylase  72,000 Units Oral TID with meals   loratadine   10 mg Oral Daily   pantoprazole   40 mg Oral Daily   pregabalin   300 mg Oral BID   rosuvastatin   40 mg Oral Daily   sodium bicarbonate   1,300 mg Oral BID   topiramate   50 mg Oral BID   Continuous Infusions:  sodium chloride  75 mL/hr at 04/11/23 0613   PRN Meds:.dextrose , prochlorperazine   Antibiotics  :    Anti-infectives (From admission, onward)    None  Objective:   Vitals:   04/10/23 1841 04/10/23 1938 04/10/23 2344 04/11/23 0617  BP:  (!) 152/94 (!) 141/85 134/82  Pulse:   (!) 101 97  Resp:  18 20 18   Temp:  98 F (36.7 C) 98.2 F (36.8 C) 98 F (36.7 C)  TempSrc:  Oral Oral  Oral  SpO2:    96%  Weight: 64.9 kg     Height: 4' 9 (1.448 m)       Wt Readings from Last 3 Encounters:  04/10/23 64.9 kg  04/05/23 64.4 kg  03/03/23 62.1 kg    No intake or output data in the 24 hours ending 04/11/23 0757   Physical Exam  Awake Alert, No new F.N deficits, Normal affect [place].AT,PERRAL Supple Neck, No JVD,   Symmetrical Chest wall movement, Good air movement bilaterally, CTAB RRR,No Gallops,Rubs or new Murmurs,  +ve B.Sounds, Abd Soft, No tenderness,   No Cyanosis, Clubbing or edema     RN pressure injury documentation:      Data Review:    Recent Labs  Lab 04/10/23 1229 04/11/23 0422  WBC 7.0 8.6  HGB 13.4 12.8  HCT 39.6 35.9*  PLT 151 167  MCV 91.0 91.6  MCH 30.8 32.7  MCHC 33.8 35.7  RDW 15.0 15.3  LYMPHSABS 2.7  --   MONOABS 0.6  --   EOSABS 0.1  --   BASOSABS 0.1  --     Recent Labs  Lab 04/10/23 1229 04/10/23 1511 04/10/23 2032 04/11/23 0422  NA 129* 128* 134* 141  K 7.5* 6.3* 5.1 3.7  CL 100 98 102 106  CO2 19* 21* 19* 21*  ANIONGAP 10 9 13 14   GLUCOSE 405* 492* 290* 50*  BUN 39* 42* 45* 49*  CREATININE 1.93* 2.05* 2.10* 2.40*  TSH  --   --  13.461*  --   MG 1.7  --   --   --   CALCIUM  9.9 10.0 10.3 9.7      Recent Labs  Lab 04/10/23 1229 04/10/23 1511 04/10/23 2032 04/11/23 0422  TSH  --   --  13.461*  --   MG 1.7  --   --   --   CALCIUM  9.9 10.0 10.3 9.7    Lab Results  Component Value Date   HGBA1C 6.9 (H) 01/02/2023   Recent Labs    04/10/23 2032  TSH 13.461*   Micro Results  Radiology Reports DG Chest Portable 1 View Result Date: 04/10/2023 CLINICAL DATA:  Fall.  Numbness and tingling. EXAM: PORTABLE CHEST 1 VIEW COMPARISON:  01/17/2023. FINDINGS: Low lung volume. Bilateral lung fields are clear. Bilateral costophrenic angles are clear. Stable cardio-mediastinal silhouette. Redemonstration of metallic clip overlying the left lower lung zone. No acute osseous abnormalities. The soft tissues are  within normal limits. IMPRESSION: *No active disease. Electronically Signed   By: Ree Molt M.D.   On: 04/10/2023 14:23   CT Head Wo Contrast Result Date: 04/10/2023 CLINICAL DATA:  Provided history: History of brain tumor. Numbness and tingling all over body. Fall. EXAM: CT HEAD WITHOUT CONTRAST TECHNIQUE: Contiguous axial images were obtained from the base of the skull through the vertex without intravenous contrast. RADIATION DOSE REDUCTION: This exam was performed according to the departmental dose-optimization program which includes automated exposure control, adjustment of the mA and/or kV according to patient size and/or use of iterative reconstruction technique. COMPARISON:  Prior head CT examinations 09/05/2022 and earlier. Brain MRI 12/06/2018. FINDINGS: Please note, there is limited  assessment for residual/recurrent tumor on this non-contrast head CT. Brain: No age-advanced or lobar predominant parenchymal atrophy. Foci of chronic encephalomalacia/gliosis within the anterior frontal lobes bilaterally. Ill-defined focus of hyperdensity at the level of the midbrain, unchanged from the prior head CT of 09/05/2022. Chronic blood products were present at this site on the prior brain MRI of 12/06/2018. There is no acute intracranial hemorrhage. No demarcated cortical infarct. No extra-axial fluid collection. No midline shift. Vascular: No hyperdense vessel.  Atherosclerotic calcifications. Skull: Post-operative changes from prior right temporal craniotomy. Sinuses/Orbits: No mass or acute finding within the imaged orbits. No significant paranasal sinus disease at the imaged levels. IMPRESSION: 1. No acute intracranial hemorrhage or evidence of an acute infarct. 2. Ill-defined focus of hyperdensity at the level of the midbrain, unchanged from the prior head CT of 09/05/2022. This likely corresponds with the chronic blood products demonstrated at this site on the prior brain MRI of 12/06/2018. Please  note, assessment for residual/recurrent tumor is limited on this non-contrast head CT. 3. Foci of chronic encephalomalacia/gliosis again noted within the anterior frontal lobes bilaterally, likely postsurgical/postprocedural. Electronically Signed   By: Rockey Childs D.O.   On: 04/10/2023 14:03      Signature  -   Lavada Stank M.D on 04/11/2023 at 7:57 AM   -  To page go to [email or web address]

## 2023-04-11 NOTE — Inpatient Diabetes Management (Signed)
 Inpatient Diabetes Program Recommendations  AACE/ADA: New Consensus Statement on Inpatient Glycemic Control (2015)  Target Ranges:  Prepandial:   less than 140 mg/dL      Peak postprandial:   less than 180 mg/dL (1-2 hours)      Critically ill patients:  140 - 180 mg/dL   Lab Results  Component Value Date   GLUCAP 116 (H) 04/11/2023   HGBA1C 6.9 (H) 01/02/2023    Review of Glycemic Control  Diabetes history: DM 2 Outpatient Diabetes medications: Humulin  R U-500 195 units breakfast and lunch, 1850 units supper, Humalog  45 units breakfast, 30 units for lunch and supper Current orders for Inpatient glycemic control:  Humulin  R U-500 195 units breakfast and lunch, 180 units supper Novolog  20 units before lunch and supper  Note: pt is very familiar to our team In previous admissions we had to reduce her home medications sometimes significantly  Inpatient Diabetes Program Recommendations:    May consider reducing Humulin  R U-500 insulin  down since the fasting glucose was low around 0400 am  -   Humulin  R U500 170 units TID with meals, Novolog  15 units TID with meals   Thanks,  Clotilda Bull RN, MSN, BC-ADM Inpatient Diabetes Coordinator Team Pager [phone] (8a-5p)

## 2023-04-11 NOTE — Progress Notes (Signed)
 Hypoglycemic Event  CBG: 48  Treatment: 8 oz juice/soda  Symptoms: None  Follow-up CBG: Time:0436 CBG Result:63  Possible Reasons for Event: Medication regimen:    Comments/MD notified:Notified Delma Post, MD    [facility]

## 2023-04-11 NOTE — Plan of Care (Signed)
  Problem: Education: Goal: Knowledge of General Education information will improve Description: Including pain rating scale, medication(s)/side effects and non-pharmacologic comfort measures Outcome: Progressing   Problem: Clinical Measurements: Goal: Diagnostic test results will improve Outcome: Progressing   Problem: Activity: Goal: Risk for activity intolerance will decrease Outcome: Progressing   Problem: Nutrition: Goal: Adequate nutrition will be maintained Outcome: Progressing   Problem: Coping: Goal: Level of anxiety will decrease Outcome: Progressing   Problem: Pain Management: Goal: General experience of comfort will improve Outcome: Progressing

## 2023-04-12 ENCOUNTER — Other Ambulatory Visit (HOSPITAL_COMMUNITY): Payer: Self-pay

## 2023-04-12 DIAGNOSIS — E875 Hyperkalemia: Secondary | ICD-10-CM | POA: Diagnosis not present

## 2023-04-12 LAB — GLUCOSE, CAPILLARY
Glucose-Capillary: 106 mg/dL — ABNORMAL HIGH (ref 70–99)
Glucose-Capillary: 111 mg/dL — ABNORMAL HIGH (ref 70–99)
Glucose-Capillary: 152 mg/dL — ABNORMAL HIGH (ref 70–99)
Glucose-Capillary: 175 mg/dL — ABNORMAL HIGH (ref 70–99)
Glucose-Capillary: 44 mg/dL — CL (ref 70–99)
Glucose-Capillary: 64 mg/dL — ABNORMAL LOW (ref 70–99)
Glucose-Capillary: 77 mg/dL (ref 70–99)

## 2023-04-12 LAB — MAGNESIUM: Magnesium: 1.9 mg/dL (ref 1.7–2.4)

## 2023-04-12 LAB — BASIC METABOLIC PANEL
Anion gap: 12 (ref 5–15)
BUN: 42 mg/dL — ABNORMAL HIGH (ref 6–20)
CO2: 16 mmol/L — ABNORMAL LOW (ref 22–32)
Calcium: 8.7 mg/dL — ABNORMAL LOW (ref 8.9–10.3)
Chloride: 110 mmol/L (ref 98–111)
Creatinine, Ser: 1.7 mg/dL — ABNORMAL HIGH (ref 0.44–1.00)
GFR, Estimated: 41 mL/min — ABNORMAL LOW (ref >60)
Glucose, Bld: 83 mg/dL (ref 70–99)
Potassium: 4.2 mmol/L (ref 3.5–5.1)
Sodium: 138 mmol/L (ref 135–145)

## 2023-04-12 MED ORDER — HYDRALAZINE HCL 100 MG PO TABS
100.0000 mg | ORAL_TABLET | Freq: Three times a day (TID) | ORAL | 0 refills | Status: DC
  Filled 2023-04-12: qty 90, 30d supply, fill #0

## 2023-04-12 MED ORDER — INSULIN ASPART 100 UNIT/ML IJ SOLN: 0.0000 [IU] | Freq: Three times a day (TID) | INTRAMUSCULAR | Status: DC

## 2023-04-12 MED ORDER — LEVOTHYROXINE SODIUM 75 MCG PO TABS
150.0000 ug | ORAL_TABLET | Freq: Every day | ORAL | Status: DC
  Filled 2023-04-12: qty 2

## 2023-04-12 MED ORDER — INSULIN ASPART 100 UNIT/ML IJ SOLN: 0.0000 [IU] | Freq: Every day | INTRAMUSCULAR | Status: DC

## 2023-04-12 MED ORDER — INSULIN REGULAR HUMAN (CONC) 500 UNIT/ML ~~LOC~~ SOPN: 150.0000 [IU] | PEN_INJECTOR | Freq: Two times a day (BID) | SUBCUTANEOUS | Status: DC

## 2023-04-12 MED ORDER — SODIUM BICARBONATE 650 MG PO TABS
1300.0000 mg | ORAL_TABLET | Freq: Once | ORAL | Status: AC
  Administered 2023-04-12: 1300 mg via ORAL
  Filled 2023-04-12: qty 2

## 2023-04-12 MED ORDER — SODIUM CHLORIDE 0.9 % IV SOLN: INTRAVENOUS | Status: DC

## 2023-04-12 MED ORDER — LEVOTHYROXINE SODIUM 150 MCG PO TABS
150.0000 ug | ORAL_TABLET | Freq: Every day | ORAL | 1 refills | Status: DC
  Filled 2023-04-12: qty 30, 30d supply, fill #0

## 2023-04-12 MED ORDER — HYDRALAZINE HCL 50 MG PO TABS: 100.0000 mg | ORAL_TABLET | Freq: Three times a day (TID) | ORAL | Status: DC

## 2023-04-12 NOTE — Discharge Instructions (Addendum)
 Do not take Aldactone   Follow with Primary MD Calton Catholic, PA-C in 7 days, follow-up with your endocrinologist and nephrologist within a week of discharge  Get CBC, CMP, 2 view Chest X ray -  checked next visit with your primary MD     Activity: As tolerated with Full fall precautions use walker/cane & assistance as needed  Disposition Home   Diet: Heart Healthy low carbohydrate diet, check CBGs q. [facility].  Special Instructions: If you have smoked or chewed Tobacco  in the last 2 yrs please stop smoking, stop any regular Alcohol   and or any Recreational drug use.  On your next visit with your primary care physician please Get Medicines reviewed and adjusted.  Please request your Prim.MD to go over all Hospital Tests and Procedure/Radiological results at the follow up, please get all Hospital records sent to your Prim MD by signing hospital release before you go home.  If you experience worsening of your admission symptoms, develop shortness of breath, life threatening emergency, suicidal or homicidal thoughts you must seek medical attention immediately by calling 911 or calling your MD immediately  if symptoms less severe.  You Must read complete instructions/literature along with all the possible adverse reactions/side effects for all the Medicines you take and that have been prescribed to you. Take any new Medicines after you have completely understood and accpet all the possible adverse reactions/side effects.

## 2023-04-12 NOTE — Plan of Care (Signed)
  Problem: Clinical Measurements: Goal: Ability to maintain clinical measurements within normal limits will improve Outcome: Progressing Goal: Will remain free from infection Outcome: Progressing Goal: Diagnostic test results will improve Outcome: Progressing Goal: Respiratory complications will improve Outcome: Progressing Goal: Cardiovascular complication will be avoided Outcome: Progressing   Problem: Nutrition: Goal: Adequate nutrition will be maintained Outcome: Progressing   Problem: Pain Management: Goal: General experience of comfort will improve Outcome: Progressing   Problem: Safety: Goal: Ability to remain free from injury will improve Outcome: Progressing

## 2023-04-12 NOTE — Progress Notes (Addendum)
 Overnight cross coverage.  Insulin  held overnight due to recurrent episodes of hypoglycemia which was treated.  CBG every 2 hours x 3 added, to closely monitor blood sugars until they are stable.   No charge note.

## 2023-04-12 NOTE — TOC Transition Note (Signed)
 Transition of Care [facility]) - Discharge Note   Patient Details  Name: Jennifer [name] MRN: [id number] Date of Birth: 10/27/1990  Transition of Care [facility]) CM/SW Contact:  Eusebio High, RN Phone Number: 04/12/2023, 10:39 AM   Clinical Narrative:     Patient will DC to home today. No TOC needs have been identified . Patient will follow up as directed on AVS.            Patient Goals and CMS Choice            Discharge Placement                       Discharge Plan and Services Additional resources added to the After Visit Summary for                                       Social Drivers of Health (SDOH) Interventions SDOH Screenings   Food Insecurity: No Food Insecurity (04/10/2023)  Housing: Low Risk  (04/10/2023)  Transportation Needs: No Transportation Needs (04/10/2023)  Utilities: Not At Risk (04/10/2023)  Alcohol  Screen: Low Risk  (12/10/2021)  Depression (PHQ2-9): Low Risk  (12/10/2021)  Financial Resource Strain: Low Risk  (09/01/2022)   Received from [facility], [facility]  Physical Activity: Inactive (12/01/2022)  Social Connections: Socially Integrated (09/01/2022)   Received from [facility], [facility]  Stress: No Stress Concern Present (09/01/2022)   Received from [facility], [facility]  Tobacco Use: Low Risk  (04/10/2023)     Readmission Risk Interventions    01/04/2023   11:45 AM 09/12/2022    5:44 PM 06/01/2022   11:13 AM  Readmission Risk Prevention Plan  Transportation Screening Complete  Complete  Medication Review (RN Care Manager) Complete  Referral to Pharmacy  PCP or Specialist appointment within 3-5 days of discharge Complete  --  HRI or Home Care Consult Complete  Complete  SW Recovery Care/Counseling Consult Complete  Complete  Palliative Care Screening Not Applicable  Not Applicable  Skilled Nursing Facility Not Applicable  Not Applicable     Information is confidential and restricted. Go to Review  Flowsheets to unlock data.

## 2023-04-12 NOTE — Progress Notes (Signed)
 Hypoglycemic Event  CBG: 64  Treatment: 8 oz juice/soda  Symptoms:  dizziness, fatigue  Follow-up CBG: [id number] CBG Result:106  Possible Reasons for Event: Unknown  Comments/MD notified: Dr. Scott Cutting

## 2023-04-12 NOTE — Discharge Summary (Signed)
 Jennifer [name] RUE:[id number] DOB: 06-Sep-1990 DOA: 04/10/2023  PCP: Calton Catholic, PA-C  Admit date: 04/10/2023  Discharge date: 04/12/2023  Admitted From: Home   Disposition:  Home   Recommendations for Outpatient Follow-up:   Follow up with PCP in 1-2 weeks  PCP Please obtain BMP/CBC, 2 view CXR in 1week,  (see Discharge instructions)   PCP Please follow up on the following pending results: Monitor CMP, TSH, free T4 and CBGs closely.   Home Health: None   Equipment/Devices: None  Consultations: None  Discharge Condition: Stable    CODE STATUS: Full    Diet Recommendation: Heart Healthy Low Carb    Chief Complaint  Patient presents with   Numbness     Brief history of present illness from the day of admission and additional interim summary      33 y.o. female with medical history significant of Hyperchylomicronemia, HFrEF, pancreatitis, astrocytoma, insulin -dependent type 2 diabetes with gastroparesis and peripheral neuropathy, hypertension, CKD 3B, pancreatic insufficiency who presented to outside ED with generalized paresthesia, weakness and fall and found to have severe life-threatening hyperkalemia.                                                                  Hospital Course    Severe life-threatening hyperkalemia does not on admission, hyperkalemia seems to be somewhat chronic and recurrent She was treated with hyperkalemia protocol, potassium is within normal limits now, continue monitoring, apparently she was taking Aldactone  which she had not been prescribed in a while from previous samples, requested to abstain from Aldactone  permanently for now, she does have underlying CKD 3B and diabetes mellitus type 2, question if she has RTA 4.  Already on diuretic MedX at home which will be continued.   Case discussed with nephrology.  Potassium levels have normalized after treatment for hyperkalemia, she is symptom-free eager to go home will be discharged home with outpatient follow-up with PCP and nephrology postdischarge.   Acute kidney injury superimposed on stage 3b chronic kidney disease (HCC) Baseline creatinine around 1.8, hydrate and monitor hold further diuretics.  Continue oral bicarb, 1 extra dose given today prior to discharge PCP to monitor, also will require outpatient nephrology follow-up.   Metabolic acidosis - Due to AKI, continue bicarb, discussed with Dr. Edson Graces we will follow her in the office, patient also requested to follow with her primary nephrologist in the meantime within a week of discharge.  Question if she also has underlying RTA 4.   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  Supportive care.  Outpatient follow-up with PCP.  He is now back to her baseline.   HTN (  hypertension)  -Blood pressure pressure quite high continue home dose Coreg , hydralazine  dose increased for better control   Chronic diastolic CHF (congestive heart failure) (HCC) -Last TTE on 09/2022 with EF of 50 to 55%, indeterminate diastolic parameters.  No significant valvular abnormalities.  Currently compensated on Coreg  and ivabradine , not on ACE ARB or Entresto  due to underlying renal insufficiency and hyperkalemia.  Quested not to use Aldactone  anymore.  Continue as before Demadex .   HX of pancreatic insufficiency.  On pancreatic supplements.     Peripheral neuropathy - Continue home Cymbalta  and Lyrica    Hyperchylomicronemia - Continue statin, fenofibrate  -[name] also on Repatha  injections.  Currently following with cardiology and may start on a new investigative drug.   Obesity (BMI 30-39.9) -  BMI of 30 follow with PCP for weight loss   Type 2 diabetes mellitus with hyperglycemia, with long-term current use of insulin  (HCC) -Last A1C of 6.9.  Home regimen for insulin  noted, she takes very high doses of Humulin  R U-500 185 units 3 times a day along with high-dose sliding scale.  Continue home regimen requested to check CBGs q. [facility]  Discharge diagnosis     Principal Problem:   Hyperkalemia Active Problems:   Acute kidney injury superimposed on stage 3b chronic kidney disease (HCC)   Type 2 diabetes mellitus with hyperglycemia, with long-term current use of insulin  (HCC)   Metabolic acidosis   Hypothyroidism   Chronic systolic CHF (congestive heart failure) (HCC)   HTN (hypertension)   Obesity (BMI 30-39.9)   Pseudohyponatremia   Hyperchylomicronemia   Peripheral neuropathy    Discharge instructions    Discharge Instructions     Discharge instructions   Complete by: As directed    Do not take Aldactone   Follow with Primary MD Calton Catholic, PA-C in 7 days, follow-up with your endocrinologist and nephrologist within a week of discharge  Get CBC, CMP, 2 view Chest X ray -  checked next visit with your primary MD     Activity: As tolerated with Full fall precautions use walker/cane & assistance as needed  Disposition Home   Diet: Heart Healthy low carbohydrate diet, check CBGs q. [facility].  Special Instructions: If you have smoked or chewed Tobacco  in the last 2 yrs please stop smoking, stop any regular Alcohol   and or any Recreational drug use.  On your next visit with your primary care physician please Get Medicines reviewed and adjusted.  Please request your Prim.MD to go over all Hospital Tests and Procedure/Radiological results at the follow up, please get all Hospital records sent to your Prim MD by signing hospital release before you go home.  If you experience worsening of your admission symptoms, develop shortness of breath, life threatening emergency,  suicidal or homicidal thoughts you must seek medical attention immediately by calling 911 or calling your MD immediately  if symptoms less severe.  You Must read complete instructions/literature along with all the possible adverse reactions/side effects for all the Medicines you take and that have been prescribed to you. Take any new Medicines after you have completely understood and accpet all the possible adverse reactions/side effects.   Increase activity slowly   Complete by: As directed        Discharge Medications   Allergies as of 04/12/2023       Reactions   Icosapent  Ethyl (epa Ethyl Ester) (fish)    Hives w/Vascepa    Ketamine Other (See Comments)   In vegetative state for  15 minutes per pt   [place] Itching   Itchy throat Itchy throat Itchy throat Itchy throat Itchy throat    Itchy throat  Itchy throat Itchy throat Itchy throat Itchy throat    Itchy throat   Morphine  Itching, Rash, Hives   Sorethroat   Penicillins Hives, Itching, Rash   Has patient had a PCN reaction causing immediate rash, facial/tongue/throat swelling, SOB or lightheadedness with hypotension: Y Has patient had a PCN reaction causing severe rash involving mucus membranes or skin necrosis: Y Has patient had a PCN reaction that required hospitalization: N Has patient had a PCN reaction occurring within the last 10 years: Y   Clarithromycin Other (See Comments)   Stomach pain   Erythromycin     Stomach pain   Fentanyl  Nausea And Vomiting   Penicillin V Hives, Itching, Other (See Comments)   Gi upset, also   Shellfish Allergy Other (See Comments)   Pt has never had shellfish but tested positive on allergy test. Pt states contrast in CT is okay   Prednisone Rash        Medication List     STOP taking these medications    spironolactone  25 MG tablet Commonly known as: ALDACTONE        TAKE these medications    amitriptyline  10 MG tablet Commonly known as: ELAVIL  Take 10 mg by mouth at  bedtime.   aspirin  EC 81 MG tablet Take 1 tablet (81 mg total) by mouth daily. Swallow whole.   azelastine  0.1 % nasal spray Commonly known as: ASTELIN  Place 2 sprays into both nostrils daily at 6 (six) AM.   [place]  Fe 1.5/30 1.5-30 MG-MCG tablet Generic drug: norethindrone -ethinyl estradiol -iron Take 1 tablet by mouth daily.   butalbital -acetaminophen -caffeine  50-325-40 MG tablet Commonly known as: FIORICET  Take 1 tablet by mouth daily as needed for headache.   carvedilol  3.125 MG tablet Commonly known as: COREG  Take 1 tablet (3.125 mg total) by mouth 2 (two) times daily.   CRANBERRY CONCENTRATE PO Take 2 tablets by mouth 2 (two) times daily.   Creon  36000-114000 units Cpep capsule Generic drug: lipase/protease/amylase Take 72,000 Units by mouth 3 (three) times daily.   DULoxetine  60 MG capsule Commonly known as: CYMBALTA  Take 60 mg by mouth at bedtime.   ergocalciferol  1.25 MG (50000 UT) capsule Commonly known as: VITAMIN D2 Take 50,000 Units by mouth every 7 (seven) days. Every Sunday   fenofibrate  54 MG tablet Take 1 tablet (54 mg total) by mouth daily.   HumuLIN  R U-500 KwikPen 500 UNIT/ML KwikPen Generic drug: insulin  regular human CONCENTRATED Inject 185 Units into the skin 3 (three) times daily with meals. What changed:  how much to take additional instructions   hydrALAZINE  100 MG tablet Commonly known as: APRESOLINE  Take 1 tablet (100 mg total) by mouth 3 (three) times daily. What changed:  medication strength how much to take   insulin  lispro 100 UNIT/ML injection Commonly known as: HUMALOG  Inject 30-45 Units into the skin 3 (three) times daily before meals. Inject subcutaneously 45 units with breakfast, 30 units with lunch and 30 units with dinner   Insulin  Pen Needle 32G X 4 MM Misc 1 Device by Does not apply route QID. For use with insulin  pens   ivabradine  7.5 MG Tabs tablet Commonly known as: CORLANOR  Take 1 tablet (7.5 mg total) by mouth  2 (two) times daily with a meal.   levocetirizine 5 MG tablet Commonly known as: XYZAL  Take 5 mg by mouth daily.   levothyroxine   150 MCG tablet Commonly known as: SYNTHROID  Take 1 tablet (150 mcg total) by mouth daily before breakfast. What changed:  medication strength how much to take when to take this   pantoprazole  40 MG tablet Commonly known as: PROTONIX  Take 40 mg by mouth daily.   pregabalin  300 MG capsule Commonly known as: LYRICA  Take 300 mg by mouth 2 (two) times daily.   prochlorperazine  5 MG tablet Commonly known as: COMPAZINE  Take 5 mg by mouth at bedtime.   Repatha  140 MG/ML Sosy Generic drug: Evolocumab  Inject 140 mg into the skin every Saturday. Inject 140 mg subcutaneously every other Wednesday evening   rosuvastatin  40 MG tablet Commonly known as: CRESTOR  Take 40 mg by mouth daily.   sodium bicarbonate  650 MG tablet Take 1,300 mg by mouth 2 (two) times daily.   SUMAtriptan  25 MG tablet Commonly known as: IMITREX  Take 25 mg by mouth every 2 (two) hours as needed.   topiramate  50 MG tablet Commonly known as: TOPAMAX  Take 50 mg by mouth 2 (two) times daily.   torsemide  20 MG tablet Commonly known as: DEMADEX  Take 2 tablets (40 mg total) by mouth daily. May take 1 additional tablet as needed   tretinoin  0.05 % cream Commonly known as: RETIN-A  Apply 1 application topically at bedtime.         Follow-up Information     Calton Catholic, PA-C. Schedule an appointment as soon as possible for a visit in 1 week(s).   Specialty: Physician Assistant Why: Follow-up with your endocrinologist and your nephrologist within a week of discharge Contact information: [address] [place] [address] [phone]         Clementine Cutting, MD. Schedule an appointment as soon as possible for a visit in 1 week(s).   Specialties: Nephrology, Internal Medicine Contact information: [address] [place]  [address] [phone]                 Major procedures and Radiology Reports - PLEASE review detailed and final reports thoroughly  -      DG Chest Portable 1 View Result Date: 04/10/2023 CLINICAL DATA:  Fall.  Numbness and tingling. EXAM: PORTABLE CHEST 1 VIEW COMPARISON:  01/17/2023. FINDINGS: Low lung volume. Bilateral lung fields are clear. Bilateral costophrenic angles are clear. Stable cardio-mediastinal silhouette. Redemonstration of metallic clip overlying the left lower lung zone. No acute osseous abnormalities. The soft tissues are within normal limits. IMPRESSION: *No active disease. Electronically Signed   By: Beula Brunswick M.D.   On: 04/10/2023 14:23   CT Head Wo Contrast Result Date: 04/10/2023 CLINICAL DATA:  Provided history: History of brain tumor. Numbness and tingling "all over body." Fall. EXAM: CT HEAD WITHOUT CONTRAST TECHNIQUE: Contiguous axial images were obtained from the base of the skull through the vertex without intravenous contrast. RADIATION DOSE REDUCTION: This exam was performed according to the departmental dose-optimization program which includes automated exposure control, adjustment of the mA and/or kV according to patient size and/or use of iterative reconstruction technique. COMPARISON:  Prior head CT examinations 09/05/2022 and earlier. Brain MRI 12/06/2018. FINDINGS: Please note, there is limited assessment for residual/recurrent tumor on this non-contrast head CT. Brain: No age-advanced or lobar predominant parenchymal atrophy. Foci of chronic encephalomalacia/gliosis within the anterior frontal lobes bilaterally. Ill-defined focus of hyperdensity at the level of the midbrain, unchanged from the prior head CT of 09/05/2022. Chronic blood products were present at this site on the prior brain MRI of 12/06/2018.  There is no acute intracranial hemorrhage. No demarcated cortical infarct. No extra-axial fluid collection. No midline shift. Vascular: No hyperdense  vessel.  Atherosclerotic calcifications. Skull: Post-operative changes from prior right temporal craniotomy. Sinuses/Orbits: No mass or acute finding within the imaged orbits. No significant paranasal sinus disease at the imaged levels. IMPRESSION: 1. No acute intracranial hemorrhage or evidence of an acute infarct. 2. Ill-defined focus of hyperdensity at the level of the midbrain, unchanged from the prior head CT of 09/05/2022. This likely corresponds with the chronic blood products demonstrated at this site on the prior brain MRI of 12/06/2018. Please note, assessment for residual/recurrent tumor is limited on this non-contrast head CT. 3. Foci of chronic encephalomalacia/gliosis again noted within the anterior frontal lobes bilaterally, likely postsurgical/postprocedural. Electronically Signed   By: Bascom Lily D.O.   On: 04/10/2023 14:03    Micro Results    Recent Results (from the past 240 hours)  Resp panel by RT-PCR (RSV, Flu A&B, Covid) Anterior Nasal Swab     Status: None   Collection Time: 04/10/23 12:29 PM   Specimen: Anterior Nasal Swab  Result Value Ref Range Status   SARS Coronavirus 2 by RT PCR NEGATIVE NEGATIVE Final    Comment: (NOTE) SARS-CoV-2 target nucleic acids are NOT DETECTED.  The SARS-CoV-2 RNA is generally detectable in upper respiratory specimens during the acute phase of infection. The lowest concentration of SARS-CoV-2 viral copies this assay can detect is 138 copies/mL. A negative result does not preclude SARS-Cov-2 infection and should not be used as the sole basis for treatment or other patient management decisions. A negative result may occur with  improper specimen collection/handling, submission of specimen other than nasopharyngeal swab, presence of viral mutation(s) within the areas targeted by this assay, and inadequate number of viral copies(<138 copies/mL). A negative result must be combined with clinical observations, patient history, and  epidemiological information. The expected result is Negative.  Fact Sheet for Patients:  BloggerCourse.com  Fact Sheet for Healthcare Providers:  SeriousBroker.it  This test is no t yet approved or cleared by the [place]  FDA and  has been authorized for detection and/or diagnosis of SARS-CoV-2 by FDA under an Emergency Use Authorization (EUA). This EUA will remain  in effect (meaning this test can be used) for the duration of the COVID-19 declaration under Section 564(b)(1) of the Act, 21 U.S.C.section 360bbb-3(b)(1), unless the authorization is terminated  or revoked sooner.       Influenza A by PCR NEGATIVE NEGATIVE Final   Influenza B by PCR NEGATIVE NEGATIVE Final    Comment: (NOTE) The Xpert Xpress SARS-CoV-2/FLU/RSV plus assay is intended as an aid in the diagnosis of influenza from Nasopharyngeal swab specimens and should not be used as a sole basis for treatment. Nasal washings and aspirates are unacceptable for Xpert Xpress SARS-CoV-2/FLU/RSV testing.  Fact Sheet for Patients: BloggerCourse.com  Fact Sheet for Healthcare Providers: SeriousBroker.it  This test is not yet approved or cleared by the [place]  FDA and has been authorized for detection and/or diagnosis of SARS-CoV-2 by FDA under an Emergency Use Authorization (EUA). This EUA will remain in effect (meaning this test can be used) for the duration of the COVID-19 declaration under Section 564(b)(1) of the Act, 21 U.S.C. section 360bbb-3(b)(1), unless the authorization is terminated or revoked.     Resp Syncytial Virus by PCR NEGATIVE NEGATIVE Final    Comment: (NOTE) Fact Sheet for Patients: BloggerCourse.com  Fact Sheet for Healthcare Providers: SeriousBroker.it  This test is not  yet approved or cleared by the [place]  FDA and has been  authorized for detection and/or diagnosis of SARS-CoV-2 by FDA under an Emergency Use Authorization (EUA). This EUA will remain in effect (meaning this test can be used) for the duration of the COVID-19 declaration under Section 564(b)(1) of the Act, 21 U.S.C. section 360bbb-3(b)(1), unless the authorization is terminated or revoked.  Performed at [facility], [address] [place], [place] [address]     Today   Subjective    Antaniyah Shenker today has no headache,no chest abdominal pain,no new weakness tingling or numbness, feels much better wants to go home today.    Objective   Blood pressure 150/80, pulse 90, temperature (!) 97.5 F (36.4 C), temperature source Oral, resp. rate 18, height 4\' 9"  (1.448 m), weight 64.9 kg, last menstrual period 03/22/2023, SpO2 96%.   Intake/Output Summary (Last 24 hours) at 04/12/2023 1014 Last data filed at 04/12/2023 0600 Gross per 24 hour  Intake 1809.56 ml  Output --  Net 1809.56 ml    Exam  Awake Alert, No new F.N deficits,    [place].AT,PERRAL Supple Neck,   Symmetrical Chest wall movement, Good air movement bilaterally, CTAB RRR,No Gallops,   +ve B.Sounds, Abd Soft, Non tender,  No Cyanosis, Clubbing or edema    Data Review   Recent Labs  Lab 04/10/23 1229 04/11/23 0422  WBC 7.0 8.6  HGB 13.4 12.8  HCT 39.6 35.9*  PLT 151 167  MCV 91.0 91.6  MCH 30.8 32.7  MCHC 33.8 35.7  RDW 15.0 15.3  LYMPHSABS 2.7  --   MONOABS 0.6  --   EOSABS 0.1  --   BASOSABS 0.1  --     Recent Labs  Lab 04/10/23 1229 04/10/23 1511 04/10/23 2032 04/11/23 0422 04/11/23 1455 04/12/23 0746 04/12/23 0757  NA 129* 128* 134* 141  --   --  138  K 7.5* 6.3* 5.1 3.7 4.4  --  4.2  CL 100 98 102 106  --   --  110  CO2 19* 21* 19* 21*  --   --  16*  ANIONGAP 10 9 13 14   --   --  12  GLUCOSE 405* 492* 290* 50*  --   --  83  BUN 39* 42* 45* 49*  --   --  42*  CREATININE 1.93* 2.05* 2.10* 2.40*  --   --  1.70*  TSH  --    --  13.461* 14.829*  --   --   --   MG 1.7  --   --   --   --  1.9  --   CALCIUM  9.9 10.0 10.3 9.7  --   --  8.7*    Total Time in preparing paper work, data evaluation and todays exam - 35 minutes  Signature  -    Lynnwood Sauer M.D on 04/12/2023 at 10:14 AM   -  To page go to [email or web address]

## 2023-04-12 NOTE — Progress Notes (Signed)
 Hypoglycemic Event  CBG: 44  Treatment: D50 50 mL (25 gm)  Symptoms: None  Follow-up CBG: Time:04:09 CBG Result:152  Possible Reasons for Event: Unknown  Comments/MD notified: Dr. Del Favia C.    Jennifer [name] O Jennifer [name]

## 2023-04-19 ENCOUNTER — Encounter (HOSPITAL_BASED_OUTPATIENT_CLINIC_OR_DEPARTMENT_OTHER): Payer: Self-pay

## 2023-04-19 ENCOUNTER — Other Ambulatory Visit: Payer: Self-pay

## 2023-04-19 ENCOUNTER — Emergency Department (HOSPITAL_BASED_OUTPATIENT_CLINIC_OR_DEPARTMENT_OTHER)
Admission: EM | Admit: 2023-04-19 | Discharge: 2023-04-19 | Disposition: A | Discharge status: Home / Self Care | Payer: Medicaid Other | Attending: Emergency Medicine | Admitting: Emergency Medicine

## 2023-04-19 DIAGNOSIS — Z7982 Long term (current) use of aspirin: Secondary | ICD-10-CM | POA: Insufficient documentation

## 2023-04-19 DIAGNOSIS — I509 Heart failure, unspecified: Secondary | ICD-10-CM | POA: Diagnosis not present

## 2023-04-19 DIAGNOSIS — E1165 Type 2 diabetes mellitus with hyperglycemia: Secondary | ICD-10-CM | POA: Insufficient documentation

## 2023-04-19 DIAGNOSIS — E1122 Type 2 diabetes mellitus with diabetic chronic kidney disease: Secondary | ICD-10-CM | POA: Diagnosis not present

## 2023-04-19 DIAGNOSIS — N183 Chronic kidney disease, stage 3 unspecified: Secondary | ICD-10-CM | POA: Insufficient documentation

## 2023-04-19 DIAGNOSIS — R739 Hyperglycemia, unspecified: Secondary | ICD-10-CM | POA: Diagnosis present

## 2023-04-19 DIAGNOSIS — Z794 Long term (current) use of insulin: Secondary | ICD-10-CM | POA: Diagnosis not present

## 2023-04-19 LAB — CBC WITH DIFFERENTIAL/PLATELET
Abs Immature Granulocytes: 0.03 10*3/uL (ref 0.00–0.07)
Basophils Absolute: 0 10*3/uL (ref 0.0–0.1)
Basophils Relative: 0 %
Eosinophils Absolute: 0.1 10*3/uL (ref 0.0–0.5)
Eosinophils Relative: 1 %
HCT: 36.1 % (ref 36.0–46.0)
Hemoglobin: 12.5 g/dL (ref 12.0–15.0)
Immature Granulocytes: 0 %
Lymphocytes Relative: 36 %
Lymphs Abs: 2.4 10*3/uL (ref 0.7–4.0)
MCH: 32 pg (ref 26.0–34.0)
MCHC: 34.6 g/dL (ref 30.0–36.0)
MCV: 92.3 fL (ref 80.0–100.0)
Monocytes Absolute: 0.4 10*3/uL (ref 0.1–1.0)
Monocytes Relative: 5 %
Neutro Abs: 3.8 10*3/uL (ref 1.7–7.7)
Neutrophils Relative %: 58 %
Platelets: 157 10*3/uL (ref 150–400)
RBC: 3.91 MIL/uL (ref 3.87–5.11)
RDW: 15.5 % (ref 11.5–15.5)
WBC: 6.8 10*3/uL (ref 4.0–10.5)
nRBC: 0 % (ref 0.0–0.2)

## 2023-04-19 LAB — BASIC METABOLIC PANEL
Anion gap: 11 (ref 5–15)
BUN: 49 mg/dL — ABNORMAL HIGH (ref 6–20)
CO2: 22 mmol/L (ref 22–32)
Calcium: 9.5 mg/dL (ref 8.9–10.3)
Chloride: 100 mmol/L (ref 98–111)
Creatinine, Ser: 2.14 mg/dL — ABNORMAL HIGH (ref 0.44–1.00)
GFR, Estimated: 31 mL/min — ABNORMAL LOW (ref >60)
Glucose, Bld: 302 mg/dL — ABNORMAL HIGH (ref 70–99)
Potassium: 4.2 mmol/L (ref 3.5–5.1)
Sodium: 133 mmol/L — ABNORMAL LOW (ref 135–145)

## 2023-04-19 LAB — CBG MONITORING, ED
Glucose-Capillary: 373 mg/dL — ABNORMAL HIGH (ref 70–99)
Glucose-Capillary: 181 mg/dL — ABNORMAL HIGH (ref 70–99)

## 2023-04-19 LAB — GLUCOSE, CAPILLARY: Glucose-Capillary: 99 mg/dL (ref 70–99)

## 2023-04-19 MED ORDER — INSULIN ASPART 100 UNIT/ML IJ SOLN: 10.0000 [IU] | Freq: Once | INTRAMUSCULAR | Status: DC

## 2023-04-19 MED ORDER — SODIUM CHLORIDE 0.9 % IV BOLUS
1000.0000 mL | Freq: Once | INTRAVENOUS | Status: AC
  Administered 2023-04-19: 1000 mL via INTRAVENOUS

## 2023-04-19 NOTE — ED Triage Notes (Signed)
In for eval of "high" (>400) blood glucose for 3-4 days. FSBS 373 upon arrival. Nausea. Denies abd pain and vomiting. Taking insulin as scheduled.

## 2023-04-19 NOTE — Discharge Instructions (Addendum)
Discuss your glucose levels with your endocrinologist.  Return if any problems.

## 2023-04-19 NOTE — ED Provider Notes (Signed)
[place] EMERGENCY DEPARTMENT AT [facility] Provider Note   CSN: [id number] Arrival date & time: 04/19/23  1402     History  Chief Complaint  Patient presents with   Hyperglycemia    Jennifer [name] is a 33 y.o. female.  Patient complains of having an elevated glucose.  Patient reports she has been taking her insulin as directed but her glucose has remained high.  Patient complains of being thirsty.  Patient has a history of just of heart failure and stage III kidney disease.  Patient reports she is supposed to watch her fluids but feels like she is not getting enough water.  Patient denies any nausea vomiting or diarrhea.  Patient reports her glucose was 373.  Patient denies any change in medications she denies any change in diet.  The history is provided by the patient. No language interpreter was used.  Hyperglycemia Progression:  Worsening Chronicity:  New Diabetes status:  Controlled with insulin Relieved by:  Nothing Ineffective treatments:  None tried      Home Medications Prior to Admission medications   Medication Sig Start Date End Date Taking? Authorizing Provider  amitriptyline (ELAVIL) 10 MG tablet Take 10 mg by mouth at bedtime.    [provider]  aspirin EC 81 MG EC tablet Take 1 tablet (81 mg total) by mouth daily. Swallow whole. 12/07/20   Pennie Banter, DO  azelastine (ASTELIN) 0.1 % nasal spray Place 2 sprays into both nostrils daily at 6 (six) AM.    [provider]  BLISOVI FE 1.5/30 1.5-30 MG-MCG tablet Take 1 tablet by mouth daily.    [provider]  butalbital-acetaminophen-caffeine (FIORICET) 50-325-40 MG tablet Take 1 tablet by mouth daily as needed for headache. 09/26/22   [provider]  carvedilol (COREG) 3.125 MG tablet Take 1 tablet (3.125 mg total) by mouth 2 (two) times daily. 10/04/22   [name], [name], MD  CRANBERRY CONCENTRATE PO Take 2 tablets by mouth 2 (two) times daily.    [provider]  CREON 36000-114000 units CPEP capsule Take 72,000 Units by mouth 3 (three) times daily.    [provider]  DULoxetine (CYMBALTA) 60 MG capsule Take 60 mg by mouth at bedtime. 09/26/22   [provider]  ergocalciferol (VITAMIN D2) 1.25 MG (50000 UT) capsule Take 50,000 Units by mouth every 7 (seven) days. Every Sunday    [provider]  Evolocumab (REPATHA) 140 MG/ML SOSY Inject 140 mg into the skin every Saturday. Inject 140 mg subcutaneously every other Wednesday evening    [provider]  fenofibrate 54 MG tablet Take 1 tablet (54 mg total) by mouth daily. 12/01/22   [name], [name], MD  hydrALAZINE (APRESOLINE) 100 MG tablet Take 1 tablet (100 mg total) by mouth 3 (three) times daily. 04/12/23   Leroy Sea, MD  insulin lispro (HUMALOG) 100 UNIT/ML injection Inject 30-45 Units into the skin 3 (three) times daily before meals. Inject subcutaneously 45 units with breakfast, 30 units with lunch and 30 units with dinner    [provider]  Insulin Pen Needle 32G X 4 MM MISC 1 Device by Does not apply route QID. For use with insulin pens 02/13/22   [name], MD  insulin regular human CONCENTRATED (HUMULIN R U-500 KWIKPEN) 500 UNIT/ML KwikPen Inject 185 Units into the skin 3 (three) times daily with meals. Patient taking differently: Inject 185-195 Units into the skin 3 (three) times daily with meals. Inject subcutaneously  195 units with breakfast and lunch and 185 units with dinner 09/20/22 01/09/24  [name], MD  ivabradine (CORLANOR) 7.5 MG TABS tablet Take 1 tablet (7.5 mg total) by mouth 2 (two) times daily with a meal. 01/31/23   Milford, Anderson Malta, FNP  levocetirizine (XYZAL) 5 MG tablet Take 5 mg by mouth daily. 03/05/22   [provider]  levothyroxine (SYNTHROID) 150 MCG tablet Take 1 tablet (150 mcg total) by mouth daily before breakfast. 04/12/23   Leroy Sea, MD  pantoprazole (PROTONIX) 40 MG  tablet Take 40 mg by mouth daily. 12/24/19   [provider]  pregabalin (LYRICA) 300 MG capsule Take 300 mg by mouth 2 (two) times daily.    [provider]  prochlorperazine (COMPAZINE) 5 MG tablet Take 5 mg by mouth at bedtime.    [provider]  rosuvastatin (CRESTOR) 40 MG tablet Take 40 mg by mouth daily. 08/09/21   [provider]  sodium bicarbonate 650 MG tablet Take 1,300 mg by mouth 2 (two) times daily.    [provider]  SUMAtriptan (IMITREX) 25 MG tablet Take 25 mg by mouth every 2 (two) hours as needed. 01/30/23   [provider]  topiramate (TOPAMAX) 50 MG tablet Take 50 mg by mouth 2 (two) times daily.    [provider]  torsemide (DEMADEX) 20 MG tablet Take 2 tablets (40 mg total) by mouth daily. May take 1 additional tablet as needed 10/05/22   [name], [name], MD  tretinoin (RETIN-A) 0.05 % cream Apply 1 application topically at bedtime. 01/17/21   [provider]      Allergies    Icosapent ethyl (epa ethyl ester) (fish), Ketamine, [place], Morphine, Penicillins, Clarithromycin, Erythromycin, Fentanyl, Penicillin v, Shellfish allergy, and Prednisone    Review of Systems   Review of Systems  All other systems reviewed and are negative.   Physical Exam Updated Vital Signs BP (!) 155/89   Pulse (!) 108   Temp 98.1 F (36.7 C) (Oral)   Resp (!) 27   Ht 4\' 9"  (1.448 m)   Wt 63.5 kg   LMP 03/22/2023   SpO2 99%   BMI 30.30 kg/m  Physical Exam Vitals and nursing note reviewed.  Constitutional:      General: [name] not in acute distress.    Appearance: [name] well-developed.  HENT:     Head: Normocephalic and atraumatic.  Eyes:     Conjunctiva/sclera: Conjunctivae normal.  Cardiovascular:     Rate and Rhythm: Normal rate.  Pulmonary:     Effort: Pulmonary effort is normal. No respiratory distress.  Abdominal:     Palpations: Abdomen is soft.     Tenderness: There is no abdominal  tenderness.  Musculoskeletal:        General: No swelling.     Cervical back: Neck supple.  Skin:    General: Skin is warm and dry.     Capillary Refill: Capillary refill takes less than 2 seconds.  Neurological:     Mental Status: [name] alert.  Psychiatric:        Mood and Affect: Mood normal.     ED Results / Procedures / Treatments   Labs (all labs ordered are listed, but only abnormal results are displayed) Labs Reviewed  BASIC METABOLIC PANEL - Abnormal; Notable for the following components:      Result Value   Sodium 133 (*)    Glucose, Bld 302 (*)  BUN 49 (*)    Creatinine, Ser 2.14 (*)    GFR, Estimated 31 (*)    All other components within normal limits  CBG MONITORING, ED - Abnormal; Notable for the following components:   Glucose-Capillary 373 (*)    All other components within normal limits  CBG MONITORING, ED - Abnormal; Notable for the following components:   Glucose-Capillary 181 (*)    All other components within normal limits  CBC WITH DIFFERENTIAL/PLATELET    EKG None  Radiology No results found.  Procedures Procedures    Medications Ordered in ED Medications  sodium chloride 0.9 % bolus 1,000 mL (0 mLs Intravenous Stopped 04/19/23 1630)    ED Course/ Medical Decision Making/ A&P                                 Medical Decision Making Patient complains of an elevated glucose.  She has not had any change in her medications or her diet.  Amount and/or Complexity of Data Reviewed Labs: ordered. Decision-making details documented in ED Course.    Details: Labs ordered reviewed and interpreted.  Kos is 302.  Risk Risk Details: Patient is given IV fluids x 1 L.  Glucose decreased to 181.  Patient does not have any laboratory signs of DKA.  Patient is not having any symptoms of DKA.  Patient is advised to watch her glucose carefully she is advised to follow-up with her primary care physician for recheck.           Final Clinical  Impression(s) / ED Diagnoses Final diagnoses:  Hyperglycemia    Rx / DC Orders ED Discharge Orders     None     An After Visit Summary was printed and given to the patient.     Elson Areas, PA-C 04/19/23 1719    Estelle June A, DO 04/24/23 1142

## 2023-04-20 DIAGNOSIS — E871 Hypo-osmolality and hyponatremia: Secondary | ICD-10-CM | POA: Insufficient documentation

## 2023-04-20 DIAGNOSIS — I509 Heart failure, unspecified: Secondary | ICD-10-CM | POA: Insufficient documentation

## 2023-04-20 DIAGNOSIS — E162 Hypoglycemia, unspecified: Secondary | ICD-10-CM | POA: Diagnosis not present

## 2023-04-20 DIAGNOSIS — Z794 Long term (current) use of insulin: Secondary | ICD-10-CM | POA: Diagnosis not present

## 2023-04-20 DIAGNOSIS — I130 Hypertensive heart and chronic kidney disease with heart failure and stage 1 through stage 4 chronic kidney disease, or unspecified chronic kidney disease: Secondary | ICD-10-CM | POA: Diagnosis not present

## 2023-04-20 DIAGNOSIS — E1122 Type 2 diabetes mellitus with diabetic chronic kidney disease: Secondary | ICD-10-CM | POA: Insufficient documentation

## 2023-04-20 DIAGNOSIS — N189 Chronic kidney disease, unspecified: Secondary | ICD-10-CM | POA: Insufficient documentation

## 2023-04-20 DIAGNOSIS — E11649 Type 2 diabetes mellitus with hypoglycemia without coma: Secondary | ICD-10-CM | POA: Diagnosis not present

## 2023-04-20 DIAGNOSIS — R0602 Shortness of breath: Secondary | ICD-10-CM | POA: Diagnosis present

## 2023-04-20 DIAGNOSIS — Z7982 Long term (current) use of aspirin: Secondary | ICD-10-CM | POA: Diagnosis not present

## 2023-04-20 DIAGNOSIS — I110 Hypertensive heart disease with heart failure: Secondary | ICD-10-CM | POA: Diagnosis not present

## 2023-04-20 DIAGNOSIS — R7401 Elevation of levels of liver transaminase levels: Secondary | ICD-10-CM | POA: Insufficient documentation

## 2023-04-20 NOTE — ED Triage Notes (Signed)
Reports retaining fluid-feels most in her abd. Unsure what her daily fluid intake should be. SOB.

## 2023-04-21 ENCOUNTER — Other Ambulatory Visit: Payer: Self-pay

## 2023-04-21 ENCOUNTER — Emergency Department (HOSPITAL_BASED_OUTPATIENT_CLINIC_OR_DEPARTMENT_OTHER)
Admission: EM | Admit: 2023-04-21 | Discharge: 2023-04-21 | Disposition: A | Discharge status: Home / Self Care | Payer: Medicaid Other | Attending: Emergency Medicine | Admitting: Emergency Medicine

## 2023-04-21 ENCOUNTER — Emergency Department (HOSPITAL_BASED_OUTPATIENT_CLINIC_OR_DEPARTMENT_OTHER): Payer: Medicaid Other | Admitting: Radiology

## 2023-04-21 DIAGNOSIS — N289 Disorder of kidney and ureter, unspecified: Secondary | ICD-10-CM

## 2023-04-21 DIAGNOSIS — E162 Hypoglycemia, unspecified: Secondary | ICD-10-CM

## 2023-04-21 DIAGNOSIS — R7401 Elevation of levels of liver transaminase levels: Secondary | ICD-10-CM

## 2023-04-21 DIAGNOSIS — R0602 Shortness of breath: Secondary | ICD-10-CM | POA: Diagnosis not present

## 2023-04-21 DIAGNOSIS — E871 Hypo-osmolality and hyponatremia: Secondary | ICD-10-CM

## 2023-04-21 DIAGNOSIS — I509 Heart failure, unspecified: Secondary | ICD-10-CM

## 2023-04-21 LAB — CBC
HCT: 36.4 % (ref 36.0–46.0)
Hemoglobin: 12.1 g/dL (ref 12.0–15.0)
MCH: 30.9 pg (ref 26.0–34.0)
MCHC: 33.2 g/dL (ref 30.0–36.0)
MCV: 92.9 fL (ref 80.0–100.0)
Platelets: 169 10*3/uL (ref 150–400)
RBC: 3.92 MIL/uL (ref 3.87–5.11)
RDW: 15.6 % — ABNORMAL HIGH (ref 11.5–15.5)
WBC: 7.8 10*3/uL (ref 4.0–10.5)
nRBC: 0 % (ref 0.0–0.2)

## 2023-04-21 LAB — COMPREHENSIVE METABOLIC PANEL
ALT: 80 U/L — ABNORMAL HIGH (ref 0–44)
AST: 53 U/L — ABNORMAL HIGH (ref 15–41)
Albumin: 5 g/dL (ref 3.5–5.0)
Alkaline Phosphatase: 74 U/L (ref 38–126)
Anion gap: 10 (ref 5–15)
BUN: 46 mg/dL — ABNORMAL HIGH (ref 6–20)
CO2: 22 mmol/L (ref 22–32)
Calcium: 9.3 mg/dL (ref 8.9–10.3)
Chloride: 102 mmol/L (ref 98–111)
Creatinine, Ser: 2.03 mg/dL — ABNORMAL HIGH (ref 0.44–1.00)
GFR, Estimated: 33 mL/min — ABNORMAL LOW (ref >60)
Glucose, Bld: 86 mg/dL (ref 70–99)
Potassium: 3.9 mmol/L (ref 3.5–5.1)
Sodium: 134 mmol/L — ABNORMAL LOW (ref 135–145)
Total Bilirubin: 0.4 mg/dL (ref 0.0–1.2)
Total Protein: 8 g/dL (ref 6.5–8.1)

## 2023-04-21 LAB — CBG MONITORING, ED
Glucose-Capillary: 126 mg/dL — ABNORMAL HIGH (ref 70–99)
Glucose-Capillary: 49 mg/dL — ABNORMAL LOW (ref 70–99)
Glucose-Capillary: 56 mg/dL — ABNORMAL LOW (ref 70–99)
Glucose-Capillary: 74 mg/dL (ref 70–99)
Glucose-Capillary: 90 mg/dL (ref 70–99)

## 2023-04-21 LAB — BRAIN NATRIURETIC PEPTIDE: B Natriuretic Peptide: 34.9 pg/mL (ref 0.0–100.0)

## 2023-04-21 LAB — LIPASE, BLOOD: Lipase: 46 U/L (ref 11–51)

## 2023-04-21 LAB — TROPONIN I (HIGH SENSITIVITY): Troponin I (High Sensitivity): 17 ng/L (ref <18)

## 2023-04-21 MED ORDER — FUROSEMIDE 10 MG/ML IJ SOLN
80.0000 mg | Freq: Once | INTRAMUSCULAR | Status: AC
Start: 2023-04-21 — End: 2023-04-21
  Administered 2023-04-21: 80 mg via INTRAVENOUS
  Filled 2023-04-21: qty 8

## 2023-04-21 NOTE — ED Notes (Signed)
Pt requesting BG checked, pt states she feels shaky. Pt BG 49, pt given apple juice and oatmeal. MD notified of low BG and intervention.

## 2023-04-21 NOTE — ED Provider Notes (Signed)
[place] EMERGENCY DEPARTMENT AT [facility] Provider Note   CSN: [id number] Arrival date & time: 04/20/23  2353     History  Chief Complaint  Patient presents with   Shortness of Breath    Jennifer [name] is a 33 y.o. female.  The history is provided by the patient.  Shortness of Breath He has history of hypertension, diabetes, hyperlipidemia, heart failure, chronic kidney disease and comes in because she feels she is fluid overloaded.  She had been in the emergency department earlier today for high blood sugar and had been given IV fluids.  She feels that her abdomen is distended.  There is slight shortness of breath and a slight heavy feeling in her chest.  She took an extra dose of torsemide at home, but has not urinated.   Home Medications Prior to Admission medications   Medication Sig Start Date End Date Taking? Authorizing Provider  amitriptyline (ELAVIL) 10 MG tablet Take 10 mg by mouth at bedtime.    [provider]  aspirin EC 81 MG EC tablet Take 1 tablet (81 mg total) by mouth daily. Swallow whole. 12/07/20   Pennie Banter, DO  azelastine (ASTELIN) 0.1 % nasal spray Place 2 sprays into both nostrils daily at 6 (six) AM.    [provider]  BLISOVI FE 1.5/30 1.5-30 MG-MCG tablet Take 1 tablet by mouth daily.    [provider]  butalbital-acetaminophen-caffeine (FIORICET) 50-325-40 MG tablet Take 1 tablet by mouth daily as needed for headache. 09/26/22   [provider]  carvedilol (COREG) 3.125 MG tablet Take 1 tablet (3.125 mg total) by mouth 2 (two) times daily. 10/04/22   [name], [name], MD  CRANBERRY CONCENTRATE PO Take 2 tablets by mouth 2 (two) times daily.    [provider]  CREON 36000-114000 units CPEP capsule Take 72,000 Units by mouth 3 (three) times daily.    [provider]  DULoxetine (CYMBALTA) 60 MG capsule Take 60 mg by mouth at bedtime. 09/26/22   [provider]   ergocalciferol (VITAMIN D2) 1.25 MG (50000 UT) capsule Take 50,000 Units by mouth every 7 (seven) days. Every Sunday    [provider]  Evolocumab (REPATHA) 140 MG/ML SOSY Inject 140 mg into the skin every Saturday. Inject 140 mg subcutaneously every other Wednesday evening    [provider]  fenofibrate 54 MG tablet Take 1 tablet (54 mg total) by mouth daily. 12/01/22   [name], [name], MD  hydrALAZINE (APRESOLINE) 100 MG tablet Take 1 tablet (100 mg total) by mouth 3 (three) times daily. 04/12/23   Leroy Sea, MD  insulin lispro (HUMALOG) 100 UNIT/ML injection Inject 30-45 Units into the skin 3 (three) times daily before meals. Inject subcutaneously 45 units with breakfast, 30 units with lunch and 30 units with dinner    [provider]  Insulin Pen Needle 32G X 4 MM MISC 1 Device by Does not apply route QID. For use with insulin pens 02/13/22   [name], MD  insulin regular human CONCENTRATED (HUMULIN R U-500 KWIKPEN) 500 UNIT/ML KwikPen Inject 185 Units into the skin 3 (three) times daily with meals. Patient taking differently: Inject 185-195 Units into the skin 3 (three) times daily with meals. Inject subcutaneously 195 units with breakfast and lunch and 185 units with dinner 09/20/22 01/09/24  [name], MD  ivabradine (CORLANOR) 7.5 MG TABS tablet Take 1 tablet (7.5 mg total) by mouth 2 (two) times daily with a  meal. 01/31/23   Milford, Anderson Malta, FNP  levocetirizine (XYZAL) 5 MG tablet Take 5 mg by mouth daily. 03/05/22   [provider]  levothyroxine (SYNTHROID) 150 MCG tablet Take 1 tablet (150 mcg total) by mouth daily before breakfast. 04/12/23   Leroy Sea, MD  pantoprazole (PROTONIX) 40 MG tablet Take 40 mg by mouth daily. 12/24/19   [provider]  pregabalin (LYRICA) 300 MG capsule Take 300 mg by mouth 2 (two) times daily.    [provider]  prochlorperazine (COMPAZINE) 5 MG tablet Take 5 mg by mouth at  bedtime.    [provider]  rosuvastatin (CRESTOR) 40 MG tablet Take 40 mg by mouth daily. 08/09/21   [provider]  sodium bicarbonate 650 MG tablet Take 1,300 mg by mouth 2 (two) times daily.    [provider]  SUMAtriptan (IMITREX) 25 MG tablet Take 25 mg by mouth every 2 (two) hours as needed. 01/30/23   [provider]  topiramate (TOPAMAX) 50 MG tablet Take 50 mg by mouth 2 (two) times daily.    [provider]  torsemide (DEMADEX) 20 MG tablet Take 2 tablets (40 mg total) by mouth daily. May take 1 additional tablet as needed 10/05/22   [name], [name], MD  tretinoin (RETIN-A) 0.05 % cream Apply 1 application topically at bedtime. 01/17/21   [provider]      Allergies    Icosapent ethyl (epa ethyl ester) (fish), Ketamine, [place], Morphine, Penicillins, Clarithromycin, Erythromycin, Fentanyl, Penicillin v, Shellfish allergy, and Prednisone    Review of Systems   Review of Systems  Respiratory:  Positive for shortness of breath.   All other systems reviewed and are negative.   Physical Exam Updated Vital Signs BP (!) 189/92 (BP Location: Right Arm)   Pulse 88   Temp 97.7 F (36.5 C)   Resp 19   Wt 64.9 kg   LMP 03/22/2023   SpO2 99%   BMI 30.94 kg/m  Physical Exam Vitals and nursing note reviewed.   33 year old female, resting comfortably and in no acute distress. Vital signs are significant for elevated blood pressure. Oxygen saturation is 99%, which is normal. Head is normocephalic and atraumatic. PERRLA, EOMI. Oropharynx is clear. Neck is nontender and supple without adenopathy or JVD. Back is nontender and there is no CVA tenderness.  There is no presacral edema. Lungs are clear without rales, wheezes, or rhonchi. Chest is nontender. Heart has regular rate and rhythm without murmur. Abdomen is soft, slightly protuberant, nontender, no fluid wave. Extremities 1+ edema, full range of motion is  present. Skin is warm and dry without rash. Neurologic: Mental status is normal, cranial nerves are intact, moves all extremities equally.  ED Results / Procedures / Treatments   Labs (all labs ordered are listed, but only abnormal results are displayed) Labs Reviewed  COMPREHENSIVE METABOLIC PANEL - Abnormal; Notable for the following components:      Result Value   Sodium 134 (*)    BUN 46 (*)    Creatinine, Ser 2.03 (*)    AST 53 (*)    ALT 80 (*)    GFR, Estimated 33 (*)    All other components within normal limits  CBC - Abnormal; Notable for the following components:   RDW 15.6 (*)    All other components within normal limits  LIPASE, BLOOD  CBG MONITORING, ED    EKG EKG Interpretation Date/Time:  Friday April 21 2023 00:05:20  EST Ventricular Rate:  96 PR Interval:  124 QRS Duration:  86 QT Interval:  380 QTC Calculation: 480 R Axis:   19  Text Interpretation: Normal sinus rhythm Prolonged QT Abnormal ECG When compared with ECG of 10-Apr-2023 12:35, HEART RATE has decreased QT has shortened Left bundle branch block is no longer present Confirmed by Dione Booze ([id number]) on 04/21/2023 12:08:30 AM  Radiology DG Chest 2 View Result Date: 04/21/2023 CLINICAL DATA:  Shortness of breath EXAM: CHEST - 2 VIEW COMPARISON:  04/10/2023 FINDINGS: Heart and mediastinal contours are within normal limits. No focal opacities or effusions. No acute bony abnormality. IMPRESSION: No active cardiopulmonary disease. Electronically Signed   By: Charlett Nose M.D.   On: 04/21/2023 00:28    Procedures Procedures    Medications Ordered in ED Medications - No data to display  ED Course/ Medical Decision Making/ A&P                                 Medical Decision Making Amount and/or Complexity of Data Reviewed Labs: ordered. Radiology: ordered.  Risk Prescription drug management.   Fluid overloaded state.  Chest x-ray shows no acute cardiopulmonary process.  I have  independently viewed the images, and agree with the radiologist's interpretation.  I have reviewed her electrocardiogram, and my interpretation is sinus rhythm with borderline prolonged QT interval.  When compared with prior ECG, QRS is narrower.  I have reviewed her laboratory tests, my interpretation is mild hyponatremia which is essentially unchanged from earlier today, stable renal insufficiency, mild elevation of transaminases which have decreased compared with 01/13/2023.  I have ordered troponin and BNP.  I have ordered an intravenous dose of furosemide.  I have reviewed her past records, and note ED visit on 04/19/2023 for hyperglycemia during which visit she received 1 L of IV fluids.  She had been hospitalized 04/10/2023-04/12/2023 for hyperkalemia.  Echocardiogram on 10/04/2022 showed ejection fraction 50-55% with indeterminant left ventricular diastolic parameters but evidence of increased left ventricular end-diastolic pressure.  Last echocardiogram to be able to identify left ventricular diastolic parameters was on 12/03/2021, and at that time left ventricular diastolic parameters were normal.  Troponin is normal, BNP is normal.  She did have good urine output from intravenous furosemide.  While here, blood glucose did drop as low as 49 and came back up with oral food intake.  I am discharging patient with instructions to increase torsemide dose to 60 mg daily for 3 days, then resume her chronic dose of 40 mg daily.  I have advised her to work with her cardiologist to establish what her dry weight is so that they can use that to adjust her diuretic dose in a dynamic fashion.  Final Clinical Impression(s) / ED Diagnoses Final diagnoses:  Acute on chronic heart failure, unspecified heart failure type (HCC)  Hyponatremia  Elevated transaminase level  Hypoglycemia  Renal insufficiency    Rx / DC Orders ED Discharge Orders     None         Dione Booze, MD 04/21/23 [phone]

## 2023-04-21 NOTE — Discharge Instructions (Addendum)
Increase your torsemide dose to 60 mg (3 tablets) every morning for the next 3 days.  Then return to your previous dose of 40 mg (2 tablets) every morning.  Work with your cardiologist to establish what your dry weight is.

## 2023-04-21 NOTE — ED Notes (Signed)
Pt given peanut butter crackers and another apple juice for 74BG.

## 2023-04-25 DIAGNOSIS — Z7689 Persons encountering health services in other specified circumstances: Secondary | ICD-10-CM | POA: Diagnosis not present

## 2023-04-27 DIAGNOSIS — E1142 Type 2 diabetes mellitus with diabetic polyneuropathy: Secondary | ICD-10-CM | POA: Diagnosis not present

## 2023-04-27 DIAGNOSIS — H02431 Paralytic ptosis of right eyelid: Secondary | ICD-10-CM | POA: Diagnosis not present

## 2023-04-27 DIAGNOSIS — C719 Malignant neoplasm of brain, unspecified: Secondary | ICD-10-CM | POA: Diagnosis not present

## 2023-04-27 DIAGNOSIS — G894 Chronic pain syndrome: Secondary | ICD-10-CM | POA: Diagnosis not present

## 2023-04-27 DIAGNOSIS — G8114 Spastic hemiplegia affecting left nondominant side: Secondary | ICD-10-CM | POA: Diagnosis not present

## 2023-04-28 ENCOUNTER — Encounter (HOSPITAL_BASED_OUTPATIENT_CLINIC_OR_DEPARTMENT_OTHER): Payer: Self-pay | Admitting: Emergency Medicine

## 2023-04-28 ENCOUNTER — Emergency Department (HOSPITAL_BASED_OUTPATIENT_CLINIC_OR_DEPARTMENT_OTHER): Payer: Medicaid Other

## 2023-04-28 ENCOUNTER — Other Ambulatory Visit: Payer: Self-pay

## 2023-04-28 ENCOUNTER — Emergency Department (HOSPITAL_BASED_OUTPATIENT_CLINIC_OR_DEPARTMENT_OTHER)
Admission: EM | Admit: 2023-04-28 | Discharge: 2023-04-28 | Disposition: A | Discharge status: Home / Self Care | Payer: Medicaid Other | Attending: Emergency Medicine | Admitting: Emergency Medicine

## 2023-04-28 DIAGNOSIS — R935 Abnormal findings on diagnostic imaging of other abdominal regions, including retroperitoneum: Secondary | ICD-10-CM

## 2023-04-28 DIAGNOSIS — E039 Hypothyroidism, unspecified: Secondary | ICD-10-CM | POA: Diagnosis not present

## 2023-04-28 DIAGNOSIS — Z7982 Long term (current) use of aspirin: Secondary | ICD-10-CM | POA: Diagnosis not present

## 2023-04-28 DIAGNOSIS — I130 Hypertensive heart and chronic kidney disease with heart failure and stage 1 through stage 4 chronic kidney disease, or unspecified chronic kidney disease: Secondary | ICD-10-CM | POA: Diagnosis not present

## 2023-04-28 DIAGNOSIS — I509 Heart failure, unspecified: Secondary | ICD-10-CM | POA: Diagnosis not present

## 2023-04-28 DIAGNOSIS — E1122 Type 2 diabetes mellitus with diabetic chronic kidney disease: Secondary | ICD-10-CM | POA: Diagnosis not present

## 2023-04-28 DIAGNOSIS — Z794 Long term (current) use of insulin: Secondary | ICD-10-CM | POA: Diagnosis not present

## 2023-04-28 DIAGNOSIS — N261 Atrophy of kidney (terminal): Secondary | ICD-10-CM | POA: Insufficient documentation

## 2023-04-28 DIAGNOSIS — N189 Chronic kidney disease, unspecified: Secondary | ICD-10-CM

## 2023-04-28 DIAGNOSIS — N184 Chronic kidney disease, stage 4 (severe): Secondary | ICD-10-CM | POA: Diagnosis not present

## 2023-04-28 DIAGNOSIS — R1030 Lower abdominal pain, unspecified: Secondary | ICD-10-CM | POA: Diagnosis present

## 2023-04-28 DIAGNOSIS — I129 Hypertensive chronic kidney disease with stage 1 through stage 4 chronic kidney disease, or unspecified chronic kidney disease: Secondary | ICD-10-CM | POA: Diagnosis not present

## 2023-04-28 DIAGNOSIS — R109 Unspecified abdominal pain: Secondary | ICD-10-CM | POA: Diagnosis not present

## 2023-04-28 LAB — URINALYSIS, ROUTINE W REFLEX MICROSCOPIC
Bacteria, UA: NONE SEEN
Bilirubin Urine: NEGATIVE
Glucose, UA: 100 mg/dL — AB
Hgb urine dipstick: NEGATIVE
Ketones, ur: NEGATIVE mg/dL
Leukocytes,Ua: NEGATIVE
Nitrite: NEGATIVE
Protein, ur: 30 mg/dL — AB
Specific Gravity, Urine: 1.012 (ref 1.005–1.030)
pH: 6 (ref 5.0–8.0)

## 2023-04-28 LAB — COMPREHENSIVE METABOLIC PANEL
ALT: 84 U/L — ABNORMAL HIGH (ref 0–44)
AST: 77 U/L — ABNORMAL HIGH (ref 15–41)
Albumin: 4.7 g/dL (ref 3.5–5.0)
Alkaline Phosphatase: 77 U/L (ref 38–126)
Anion gap: 11 (ref 5–15)
BUN: 52 mg/dL — ABNORMAL HIGH (ref 6–20)
CO2: 23 mmol/L (ref 22–32)
Calcium: 10.1 mg/dL (ref 8.9–10.3)
Chloride: 104 mmol/L (ref 98–111)
Creatinine, Ser: 2.23 mg/dL — ABNORMAL HIGH (ref 0.44–1.00)
GFR, Estimated: 29 mL/min — ABNORMAL LOW (ref >60)
Glucose, Bld: 69 mg/dL — ABNORMAL LOW (ref 70–99)
Potassium: 4.3 mmol/L (ref 3.5–5.1)
Sodium: 138 mmol/L (ref 135–145)
Total Bilirubin: 0.5 mg/dL (ref 0.0–1.2)
Total Protein: 8.6 g/dL — ABNORMAL HIGH (ref 6.5–8.1)

## 2023-04-28 LAB — CBC
HCT: 40.3 % (ref 36.0–46.0)
Hemoglobin: 13.2 g/dL (ref 12.0–15.0)
MCH: 30.6 pg (ref 26.0–34.0)
MCHC: 32.8 g/dL (ref 30.0–36.0)
MCV: 93.3 fL (ref 80.0–100.0)
Platelets: 213 10*3/uL (ref 150–400)
RBC: 4.32 MIL/uL (ref 3.87–5.11)
RDW: 15.6 % — ABNORMAL HIGH (ref 11.5–15.5)
WBC: 7.3 10*3/uL (ref 4.0–10.5)
nRBC: 0 % (ref 0.0–0.2)

## 2023-04-28 LAB — LIPASE, BLOOD: Lipase: 32 U/L (ref 11–51)

## 2023-04-28 LAB — CBG MONITORING, ED: Glucose-Capillary: 49 mg/dL — ABNORMAL LOW (ref 70–99)

## 2023-04-28 LAB — PREGNANCY, URINE: Preg Test, Ur: NEGATIVE

## 2023-04-28 MED ORDER — SODIUM CHLORIDE 0.9 % IV BOLUS
1000.0000 mL | Freq: Once | INTRAVENOUS | Status: AC
Start: 2023-04-28 — End: 2023-04-28
  Administered 2023-04-28: 1000 mL via INTRAVENOUS

## 2023-04-28 MED ORDER — OXYCODONE-ACETAMINOPHEN 5-325 MG PO TABS
1.0000 | ORAL_TABLET | Freq: Once | ORAL | Status: AC | PRN
  Administered 2023-04-28: 1 via ORAL
  Filled 2023-04-28: qty 1

## 2023-04-28 MED ORDER — KETOROLAC TROMETHAMINE 15 MG/ML IJ SOLN
15.0000 mg | Freq: Once | INTRAMUSCULAR | Status: AC
  Administered 2023-04-28: 15 mg via INTRAVENOUS
  Filled 2023-04-28: qty 1

## 2023-04-28 NOTE — ED Notes (Signed)
 Pt unable to provide a urine sample at this time.

## 2023-04-28 NOTE — ED Provider Notes (Incomplete)
[place] EMERGENCY DEPARTMENT AT [facility] Provider Note   CSN: [id number] Arrival date & time: 04/28/23  1405     History {Add pertinent medical, surgical, social history, OB history to HPI:1} Chief Complaint  Patient presents with   Abdominal Pain   Weakness    Jennifer [name] is a 33 y.o. female.  Patient is a 33 year old female with past medical history of diabetes, hypertension, CKD stage IV, CHF, A-fib, hypothyroidism, presenting for complaints of flank pain.  Patient presents for concerns of bilateral flank pain.  Patient states she has intermittent abdominal pain  The history is provided by the patient. No language interpreter was used.  Abdominal Pain Associated symptoms: no chest pain, no chills, no cough, no dysuria, no fever, no hematuria, no shortness of breath, no sore throat and no vomiting   Weakness Associated symptoms: abdominal pain   Associated symptoms: no arthralgias, no chest pain, no cough, no dysuria, no fever, no seizures, no shortness of breath and no vomiting        Home Medications Prior to Admission medications   Medication Sig Start Date End Date Taking? Authorizing Provider  amitriptyline (ELAVIL) 10 MG tablet Take 10 mg by mouth at bedtime.    [provider]  aspirin EC 81 MG EC tablet Take 1 tablet (81 mg total) by mouth daily. Swallow whole. 12/07/20   Pennie Banter, DO  azelastine (ASTELIN) 0.1 % nasal spray Place 2 sprays into both nostrils daily at 6 (six) AM.    [provider]  BLISOVI FE 1.5/30 1.5-30 MG-MCG tablet Take 1 tablet by mouth daily.    [provider]  butalbital-acetaminophen-caffeine (FIORICET) 50-325-40 MG tablet Take 1 tablet by mouth daily as needed for headache. 09/26/22   [provider]  carvedilol (COREG) 3.125 MG tablet Take 1 tablet (3.125 mg total) by mouth 2 (two) times daily. 10/04/22   [name], [name], MD  CRANBERRY CONCENTRATE PO Take 2 tablets by mouth 2  (two) times daily.    [provider]  CREON 36000-114000 units CPEP capsule Take 72,000 Units by mouth 3 (three) times daily.    [provider]  DULoxetine (CYMBALTA) 60 MG capsule Take 60 mg by mouth at bedtime. 09/26/22   [provider]  ergocalciferol (VITAMIN D2) 1.25 MG (50000 UT) capsule Take 50,000 Units by mouth every 7 (seven) days. Every Sunday    [provider]  Evolocumab (REPATHA) 140 MG/ML SOSY Inject 140 mg into the skin every Saturday. Inject 140 mg subcutaneously every other Wednesday evening    [provider]  fenofibrate 54 MG tablet Take 1 tablet (54 mg total) by mouth daily. 12/01/22   [name], [name], MD  hydrALAZINE (APRESOLINE) 100 MG tablet Take 1 tablet (100 mg total) by mouth 3 (three) times daily. 04/12/23   Leroy Sea, MD  insulin lispro (HUMALOG) 100 UNIT/ML injection Inject 30-45 Units into the skin 3 (three) times daily before meals. Inject subcutaneously 45 units with breakfast, 30 units with lunch and 30 units with dinner    [provider]  Insulin Pen Needle 32G X 4 MM MISC 1 Device by Does not apply route QID. For use with insulin pens 02/13/22   [name], MD  insulin regular human CONCENTRATED (HUMULIN R U-500 KWIKPEN) 500 UNIT/ML KwikPen Inject 185 Units into the skin 3 (three) times daily with meals. Patient taking differently: Inject 185-195 Units into the skin 3 (three) times daily with meals. Inject subcutaneously  195 units with breakfast and lunch and 185 units with dinner 09/20/22 01/09/24  [name], MD  ivabradine (CORLANOR) 7.5 MG TABS tablet Take 1 tablet (7.5 mg total) by mouth 2 (two) times daily with a meal. 01/31/23   Milford, Anderson Malta, FNP  levocetirizine (XYZAL) 5 MG tablet Take 5 mg by mouth daily. 03/05/22   [provider]  levothyroxine (SYNTHROID) 150 MCG tablet Take 1 tablet (150 mcg total) by mouth daily before breakfast. 04/12/23   Leroy Sea, MD   pantoprazole (PROTONIX) 40 MG tablet Take 40 mg by mouth daily. 12/24/19   [provider]  pregabalin (LYRICA) 300 MG capsule Take 300 mg by mouth 2 (two) times daily.    [provider]  prochlorperazine (COMPAZINE) 5 MG tablet Take 5 mg by mouth at bedtime.    [provider]  rosuvastatin (CRESTOR) 40 MG tablet Take 40 mg by mouth daily. 08/09/21   [provider]  sodium bicarbonate 650 MG tablet Take 1,300 mg by mouth 2 (two) times daily.    [provider]  SUMAtriptan (IMITREX) 25 MG tablet Take 25 mg by mouth every 2 (two) hours as needed. 01/30/23   [provider]  topiramate (TOPAMAX) 50 MG tablet Take 50 mg by mouth 2 (two) times daily.    [provider]  torsemide (DEMADEX) 20 MG tablet Take 2 tablets (40 mg total) by mouth daily. May take 1 additional tablet as needed 10/05/22   [name], [name], MD  tretinoin (RETIN-A) 0.05 % cream Apply 1 application topically at bedtime. 01/17/21   [provider]      Allergies    Icosapent ethyl (epa ethyl ester) (fish), Ketamine, [place], Morphine, Penicillins, Clarithromycin, Erythromycin, Fentanyl, Penicillin v, Shellfish allergy, and Prednisone    Review of Systems   Review of Systems  Constitutional:  Negative for chills and fever.  HENT:  Negative for ear pain and sore throat.   Eyes:  Negative for pain and visual disturbance.  Respiratory:  Negative for cough and shortness of breath.   Cardiovascular:  Negative for chest pain and palpitations.  Gastrointestinal:  Positive for abdominal pain. Negative for vomiting.  Genitourinary:  Positive for flank pain. Negative for dysuria and hematuria.  Musculoskeletal:  Negative for arthralgias and back pain.  Skin:  Negative for color change and rash.  Neurological:  Positive for weakness. Negative for seizures and syncope.  All other systems reviewed and are negative.   Physical Exam Updated Vital Signs BP (!)  157/100   Pulse (!) 117   Temp 98.1 F (36.7 C) (Oral)   Resp 18   Ht 4\' 9"  (1.448 m)   Wt 63.5 kg   LMP 03/22/2023   SpO2 96%   BMI 30.30 kg/m  Physical Exam Vitals and nursing note reviewed.  Constitutional:      General: [name] not in acute distress.    Appearance: [name] well-developed.  HENT:     Head: Normocephalic and atraumatic.  Eyes:     Conjunctiva/sclera: Conjunctivae normal.  Cardiovascular:     Rate and Rhythm: Normal rate and regular rhythm.     Heart sounds: No murmur heard. Pulmonary:     Effort: Pulmonary effort is normal. No respiratory distress.     Breath sounds: Normal breath sounds.  Abdominal:     Palpations: Abdomen is soft.     Tenderness: There is no abdominal tenderness.  Musculoskeletal:        General:  No swelling.     Cervical back: Neck supple.  Skin:    General: Skin is warm and dry.     Capillary Refill: Capillary refill takes less than 2 seconds.  Neurological:     Mental Status: [name] alert.  Psychiatric:        Mood and Affect: Mood normal.     ED Results / Procedures / Treatments   Labs (all labs ordered are listed, but only abnormal results are displayed) Labs Reviewed  LIPASE, BLOOD  COMPREHENSIVE METABOLIC PANEL  CBC  URINALYSIS, ROUTINE W REFLEX MICROSCOPIC  PREGNANCY, URINE    EKG None  Radiology No results found.  Procedures Procedures  {Document cardiac monitor, telemetry assessment procedure when appropriate:1}  Medications Ordered in ED Medications - No data to display  ED Course/ Medical Decision Making/ A&P   {   Click here for ABCD2, HEART and other calculatorsREFRESH Note before signing :1}                              Medical Decision Making Amount and/or Complexity of Data Reviewed Labs: ordered. Radiology: ordered.  Risk Prescription drug management.   ***  {Document critical care time when appropriate:1} {Document review of labs and clinical decision tools ie heart score,  Chads2Vasc2 etc:1}  {Document your independent review of radiology images, and any outside records:1} {Document your discussion with family members, caretakers, and with consultants:1} {Document social determinants of health affecting pt's care:1} {Document your decision making why or why not admission, treatments were needed:1} Final Clinical Impression(s) / ED Diagnoses Final diagnoses:  None    Rx / DC Orders ED Discharge Orders     None

## 2023-04-28 NOTE — ED Notes (Signed)
 Pt unable to provide urine sample at this time

## 2023-04-28 NOTE — Discharge Instructions (Signed)
Please keep your appointment with your new nephrologist in [place] in 2 weeks.  Concerns that your bilateral flank pain is secondary to your chronic kidney disease which is likely resulting in your abnormal kidney ultrasound demonstrating cortical atrophy.  Take Tylenol as needed for pain and increase hydration.

## 2023-04-28 NOTE — ED Triage Notes (Signed)
Pt c/o abdominal pain and weakness for the last few days, unable to keep food down. Was recently taken off Spironolactone by cards at last admission.

## 2023-04-29 DIAGNOSIS — Z419 Encounter for procedure for purposes other than remedying health state, unspecified: Secondary | ICD-10-CM | POA: Diagnosis not present

## 2023-05-02 DIAGNOSIS — Z7689 Persons encountering health services in other specified circumstances: Secondary | ICD-10-CM | POA: Diagnosis not present

## 2023-05-02 DIAGNOSIS — M25532 Pain in left wrist: Secondary | ICD-10-CM | POA: Diagnosis not present

## 2023-05-04 ENCOUNTER — Encounter: Payer: Self-pay | Admitting: Primary Care

## 2023-05-04 ENCOUNTER — Inpatient Hospital Stay (HOSPITAL_COMMUNITY)
Admission: EM | Admit: 2023-05-04 | Discharge: 2023-05-10 | DRG: 641 | Disposition: A | Discharge status: Home / Self Care | Payer: Medicaid Other | Attending: Internal Medicine | Admitting: Internal Medicine

## 2023-05-04 ENCOUNTER — Ambulatory Visit: Payer: Medicaid Other | Admitting: Primary Care

## 2023-05-04 ENCOUNTER — Emergency Department (HOSPITAL_COMMUNITY): Payer: Medicaid Other

## 2023-05-04 VITALS — BP 160/100 | HR 119 | Temp 96.9°F | Ht <= 58 in | Wt 144.0 lb

## 2023-05-04 DIAGNOSIS — I5030 Unspecified diastolic (congestive) heart failure: Secondary | ICD-10-CM | POA: Diagnosis not present

## 2023-05-04 DIAGNOSIS — E1143 Type 2 diabetes mellitus with diabetic autonomic (poly)neuropathy: Secondary | ICD-10-CM

## 2023-05-04 DIAGNOSIS — I169 Hypertensive crisis, unspecified: Secondary | ICD-10-CM | POA: Diagnosis present

## 2023-05-04 DIAGNOSIS — Z880 Allergy status to penicillin: Secondary | ICD-10-CM

## 2023-05-04 DIAGNOSIS — I5041 Acute combined systolic (congestive) and diastolic (congestive) heart failure: Secondary | ICD-10-CM | POA: Diagnosis present

## 2023-05-04 DIAGNOSIS — K3184 Gastroparesis: Secondary | ICD-10-CM | POA: Diagnosis present

## 2023-05-04 DIAGNOSIS — I4891 Unspecified atrial fibrillation: Secondary | ICD-10-CM | POA: Diagnosis present

## 2023-05-04 DIAGNOSIS — K861 Other chronic pancreatitis: Secondary | ICD-10-CM | POA: Diagnosis present

## 2023-05-04 DIAGNOSIS — Z91013 Allergy to seafood: Secondary | ICD-10-CM

## 2023-05-04 DIAGNOSIS — Z833 Family history of diabetes mellitus: Secondary | ICD-10-CM

## 2023-05-04 DIAGNOSIS — I10 Essential (primary) hypertension: Secondary | ICD-10-CM | POA: Diagnosis present

## 2023-05-04 DIAGNOSIS — N179 Acute kidney failure, unspecified: Secondary | ICD-10-CM | POA: Diagnosis not present

## 2023-05-04 DIAGNOSIS — E872 Acidosis, unspecified: Secondary | ICD-10-CM | POA: Diagnosis present

## 2023-05-04 DIAGNOSIS — N912 Amenorrhea, unspecified: Secondary | ICD-10-CM | POA: Diagnosis present

## 2023-05-04 DIAGNOSIS — G4489 Other headache syndrome: Secondary | ICD-10-CM | POA: Diagnosis not present

## 2023-05-04 DIAGNOSIS — D649 Anemia, unspecified: Secondary | ICD-10-CM | POA: Diagnosis not present

## 2023-05-04 DIAGNOSIS — E8779 Other fluid overload: Secondary | ICD-10-CM | POA: Diagnosis not present

## 2023-05-04 DIAGNOSIS — Z881 Allergy status to other antibiotic agents status: Secondary | ICD-10-CM

## 2023-05-04 DIAGNOSIS — N184 Chronic kidney disease, stage 4 (severe): Secondary | ICD-10-CM | POA: Diagnosis not present

## 2023-05-04 DIAGNOSIS — R3911 Hesitancy of micturition: Secondary | ICD-10-CM | POA: Diagnosis not present

## 2023-05-04 DIAGNOSIS — E1165 Type 2 diabetes mellitus with hyperglycemia: Secondary | ICD-10-CM | POA: Diagnosis present

## 2023-05-04 DIAGNOSIS — Z83438 Family history of other disorder of lipoprotein metabolism and other lipidemia: Secondary | ICD-10-CM

## 2023-05-04 DIAGNOSIS — Z885 Allergy status to narcotic agent status: Secondary | ICD-10-CM

## 2023-05-04 DIAGNOSIS — I130 Hypertensive heart and chronic kidney disease with heart failure and stage 1 through stage 4 chronic kidney disease, or unspecified chronic kidney disease: Secondary | ICD-10-CM | POA: Diagnosis not present

## 2023-05-04 DIAGNOSIS — R1084 Generalized abdominal pain: Secondary | ICD-10-CM | POA: Diagnosis not present

## 2023-05-04 DIAGNOSIS — E875 Hyperkalemia: Principal | ICD-10-CM | POA: Diagnosis present

## 2023-05-04 DIAGNOSIS — R000 Tachycardia, unspecified: Secondary | ICD-10-CM | POA: Diagnosis not present

## 2023-05-04 DIAGNOSIS — R109 Unspecified abdominal pain: Secondary | ICD-10-CM | POA: Diagnosis present

## 2023-05-04 DIAGNOSIS — Z743 Need for continuous supervision: Secondary | ICD-10-CM | POA: Diagnosis not present

## 2023-05-04 DIAGNOSIS — G4734 Idiopathic sleep related nonobstructive alveolar hypoventilation: Secondary | ICD-10-CM | POA: Diagnosis not present

## 2023-05-04 DIAGNOSIS — R162 Hepatomegaly with splenomegaly, not elsewhere classified: Secondary | ICD-10-CM | POA: Diagnosis not present

## 2023-05-04 DIAGNOSIS — Z800 Family history of malignant neoplasm of digestive organs: Secondary | ICD-10-CM

## 2023-05-04 DIAGNOSIS — Z85841 Personal history of malignant neoplasm of brain: Secondary | ICD-10-CM

## 2023-05-04 DIAGNOSIS — K5900 Constipation, unspecified: Secondary | ICD-10-CM | POA: Diagnosis not present

## 2023-05-04 DIAGNOSIS — Z8249 Family history of ischemic heart disease and other diseases of the circulatory system: Secondary | ICD-10-CM

## 2023-05-04 DIAGNOSIS — I2510 Atherosclerotic heart disease of native coronary artery without angina pectoris: Secondary | ICD-10-CM | POA: Diagnosis present

## 2023-05-04 DIAGNOSIS — E66812 Obesity, class 2: Secondary | ICD-10-CM | POA: Diagnosis present

## 2023-05-04 DIAGNOSIS — N390 Urinary tract infection, site not specified: Secondary | ICD-10-CM | POA: Diagnosis present

## 2023-05-04 DIAGNOSIS — Z8616 Personal history of COVID-19: Secondary | ICD-10-CM

## 2023-05-04 DIAGNOSIS — Z7689 Persons encountering health services in other specified circumstances: Secondary | ICD-10-CM | POA: Diagnosis not present

## 2023-05-04 DIAGNOSIS — I252 Old myocardial infarction: Secondary | ICD-10-CM

## 2023-05-04 DIAGNOSIS — Z79890 Hormone replacement therapy (postmenopausal): Secondary | ICD-10-CM

## 2023-05-04 DIAGNOSIS — E1122 Type 2 diabetes mellitus with diabetic chronic kidney disease: Secondary | ICD-10-CM | POA: Diagnosis present

## 2023-05-04 DIAGNOSIS — Z79899 Other long term (current) drug therapy: Secondary | ICD-10-CM

## 2023-05-04 DIAGNOSIS — E039 Hypothyroidism, unspecified: Secondary | ICD-10-CM | POA: Diagnosis present

## 2023-05-04 DIAGNOSIS — E871 Hypo-osmolality and hyponatremia: Secondary | ICD-10-CM | POA: Diagnosis present

## 2023-05-04 DIAGNOSIS — Z888 Allergy status to other drugs, medicaments and biological substances status: Secondary | ICD-10-CM

## 2023-05-04 DIAGNOSIS — I499 Cardiac arrhythmia, unspecified: Secondary | ICD-10-CM | POA: Diagnosis not present

## 2023-05-04 DIAGNOSIS — Z7982 Long term (current) use of aspirin: Secondary | ICD-10-CM

## 2023-05-04 DIAGNOSIS — K859 Acute pancreatitis without necrosis or infection, unspecified: Secondary | ICD-10-CM | POA: Diagnosis present

## 2023-05-04 DIAGNOSIS — E1169 Type 2 diabetes mellitus with other specified complication: Secondary | ICD-10-CM | POA: Diagnosis present

## 2023-05-04 DIAGNOSIS — Z794 Long term (current) use of insulin: Secondary | ICD-10-CM

## 2023-05-04 DIAGNOSIS — R0681 Apnea, not elsewhere classified: Secondary | ICD-10-CM

## 2023-05-04 DIAGNOSIS — K760 Fatty (change of) liver, not elsewhere classified: Secondary | ICD-10-CM | POA: Diagnosis present

## 2023-05-04 DIAGNOSIS — Z6830 Body mass index (BMI) 30.0-30.9, adult: Secondary | ICD-10-CM

## 2023-05-04 DIAGNOSIS — Z6831 Body mass index (BMI) 31.0-31.9, adult: Secondary | ICD-10-CM

## 2023-05-04 DIAGNOSIS — I5042 Chronic combined systolic (congestive) and diastolic (congestive) heart failure: Secondary | ICD-10-CM | POA: Diagnosis present

## 2023-05-04 LAB — CBC
HCT: 42.7 % (ref 36.0–46.0)
Hemoglobin: 14.6 g/dL (ref 12.0–15.0)
MCH: 31.5 pg (ref 26.0–34.0)
MCHC: 34.2 g/dL (ref 30.0–36.0)
MCV: 92.2 fL (ref 80.0–100.0)
Platelets: 180 10*3/uL (ref 150–400)
RBC: 4.63 MIL/uL (ref 3.87–5.11)
RDW: 15.2 % (ref 11.5–15.5)
WBC: 8.6 10*3/uL (ref 4.0–10.5)
nRBC: 0 % (ref 0.0–0.2)

## 2023-05-04 LAB — I-STAT CHEM 8, ED
BUN: 50 mg/dL — ABNORMAL HIGH (ref 6–20)
BUN: 37 mg/dL — ABNORMAL HIGH (ref 6–20)
Calcium, Ion: 1.12 mmol/L — ABNORMAL LOW (ref 1.15–1.40)
Calcium, Ion: 1.13 mmol/L — ABNORMAL LOW (ref 1.15–1.40)
Chloride: 110 mmol/L (ref 98–111)
Chloride: 110 mmol/L (ref 98–111)
Creatinine, Ser: 2.1 mg/dL — ABNORMAL HIGH (ref 0.44–1.00)
Creatinine, Ser: 2.1 mg/dL — ABNORMAL HIGH (ref 0.44–1.00)
Glucose, Bld: 355 mg/dL — ABNORMAL HIGH (ref 70–99)
Glucose, Bld: 309 mg/dL — ABNORMAL HIGH (ref 70–99)
HCT: 43 % (ref 36.0–46.0)
HCT: 38 % (ref 36.0–46.0)
Hemoglobin: 14.6 g/dL (ref 12.0–15.0)
Hemoglobin: 12.9 g/dL (ref 12.0–15.0)
Potassium: 7.7 mmol/L (ref 3.5–5.1)
Potassium: 5.6 mmol/L — ABNORMAL HIGH (ref 3.5–5.1)
Sodium: 131 mmol/L — ABNORMAL LOW (ref 135–145)
Sodium: 133 mmol/L — ABNORMAL LOW (ref 135–145)
TCO2: 20 mmol/L — ABNORMAL LOW (ref 22–32)
TCO2: 19 mmol/L — ABNORMAL LOW (ref 22–32)

## 2023-05-04 LAB — COMPREHENSIVE METABOLIC PANEL
ALT: 98 U/L — ABNORMAL HIGH (ref 0–44)
ALT: 81 U/L — ABNORMAL HIGH (ref 0–44)
AST: 88 U/L — ABNORMAL HIGH (ref 15–41)
AST: 55 U/L — ABNORMAL HIGH (ref 15–41)
Albumin: 4.6 g/dL (ref 3.5–5.0)
Albumin: 3.9 g/dL (ref 3.5–5.0)
Alkaline Phosphatase: 87 U/L (ref 38–126)
Alkaline Phosphatase: 71 U/L (ref 38–126)
Anion gap: 13 (ref 5–15)
Anion gap: 12 (ref 5–15)
BUN: 36 mg/dL — ABNORMAL HIGH (ref 6–20)
BUN: 37 mg/dL — ABNORMAL HIGH (ref 6–20)
CO2: 19 mmol/L — ABNORMAL LOW (ref 22–32)
CO2: 19 mmol/L — ABNORMAL LOW (ref 22–32)
Calcium: 9.3 mg/dL (ref 8.9–10.3)
Calcium: 9.3 mg/dL (ref 8.9–10.3)
Chloride: 97 mmol/L — ABNORMAL LOW (ref 98–111)
Chloride: 103 mmol/L (ref 98–111)
Creatinine, Ser: 1.83 mg/dL — ABNORMAL HIGH (ref 0.44–1.00)
Creatinine, Ser: 1.92 mg/dL — ABNORMAL HIGH (ref 0.44–1.00)
GFR, Estimated: 37 mL/min — ABNORMAL LOW (ref >60)
GFR, Estimated: 35 mL/min — ABNORMAL LOW (ref >60)
Glucose, Bld: 335 mg/dL — ABNORMAL HIGH (ref 70–99)
Glucose, Bld: 259 mg/dL — ABNORMAL HIGH (ref 70–99)
Potassium: 7.1 mmol/L (ref 3.5–5.1)
Potassium: 4.8 mmol/L (ref 3.5–5.1)
Sodium: 129 mmol/L — ABNORMAL LOW (ref 135–145)
Sodium: 134 mmol/L — ABNORMAL LOW (ref 135–145)
Total Bilirubin: 1.5 mg/dL — ABNORMAL HIGH (ref 0.0–1.2)
Total Bilirubin: 0.7 mg/dL (ref 0.0–1.2)
Total Protein: 9.5 g/dL — ABNORMAL HIGH (ref 6.5–8.1)
Total Protein: 7.3 g/dL (ref 6.5–8.1)

## 2023-05-04 LAB — URINALYSIS, ROUTINE W REFLEX MICROSCOPIC
Bilirubin Urine: NEGATIVE
Glucose, UA: 50 mg/dL — AB
Ketones, ur: NEGATIVE mg/dL
Nitrite: NEGATIVE
Protein, ur: 30 mg/dL — AB
Specific Gravity, Urine: 1.008 (ref 1.005–1.030)
pH: 5 (ref 5.0–8.0)

## 2023-05-04 LAB — BETA-HYDROXYBUTYRIC ACID: Beta-Hydroxybutyric Acid: 0.13 mmol/L (ref 0.05–0.27)

## 2023-05-04 LAB — HCG, SERUM, QUALITATIVE: Preg, Serum: NEGATIVE

## 2023-05-04 LAB — GLUCOSE, CAPILLARY: Glucose-Capillary: 241 mg/dL — ABNORMAL HIGH (ref 70–99)

## 2023-05-04 LAB — CBG MONITORING, ED: Glucose-Capillary: 365 mg/dL — ABNORMAL HIGH (ref 70–99)

## 2023-05-04 LAB — LIPASE, BLOOD: Lipase: 29 U/L (ref 11–51)

## 2023-05-04 LAB — I-STAT CG4 LACTIC ACID, ED: Lactic Acid, Venous: 2.4 mmol/L (ref 0.5–1.9)

## 2023-05-04 MED ORDER — TORSEMIDE 20 MG PO TABS
40.0000 mg | ORAL_TABLET | Freq: Every day | ORAL | Status: DC
  Administered 2023-05-04 – 2023-05-07 (×4): 40 mg via ORAL
  Filled 2023-05-04 (×4): qty 2

## 2023-05-04 MED ORDER — POLYETHYLENE GLYCOL 3350 17 G PO PACK: 17.0000 g | PACK | Freq: Every day | ORAL | Status: DC | PRN

## 2023-05-04 MED ORDER — SODIUM CHLORIDE 0.9% FLUSH
3.0000 mL | Freq: Two times a day (BID) | INTRAVENOUS | Status: DC
  Administered 2023-05-04 – 2023-05-10 (×12): 3 mL via INTRAVENOUS

## 2023-05-04 MED ORDER — SODIUM ZIRCONIUM CYCLOSILICATE 10 G PO PACK
10.0000 g | PACK | Freq: Three times a day (TID) | ORAL | Status: DC
  Administered 2023-05-04: 10 g via ORAL
  Filled 2023-05-04: qty 1

## 2023-05-04 MED ORDER — HEPARIN SODIUM (PORCINE) 5000 UNIT/ML IJ SOLN
5000.0000 [IU] | Freq: Three times a day (TID) | INTRAMUSCULAR | Status: DC
  Administered 2023-05-05 – 2023-05-10 (×16): 5000 [IU] via SUBCUTANEOUS
  Filled 2023-05-04 (×16): qty 1

## 2023-05-04 MED ORDER — SODIUM ZIRCONIUM CYCLOSILICATE 10 G PO PACK
10.0000 g | PACK | Freq: Every day | ORAL | Status: DC
  Administered 2023-05-04 – 2023-05-07 (×4): 10 g via ORAL
  Filled 2023-05-04 (×4): qty 1

## 2023-05-04 MED ORDER — ASPIRIN 81 MG PO TBEC
81.0000 mg | DELAYED_RELEASE_TABLET | Freq: Every day | ORAL | Status: DC
Start: 2023-05-05 — End: 2023-05-10
  Administered 2023-05-05 – 2023-05-10 (×6): 81 mg via ORAL
  Filled 2023-05-04 (×6): qty 1

## 2023-05-04 MED ORDER — INSULIN ASPART 100 UNIT/ML IJ SOLN
30.0000 [IU] | Freq: Two times a day (BID) | INTRAMUSCULAR | Status: DC
  Administered 2023-05-05 – 2023-05-09 (×8): 30 [IU] via SUBCUTANEOUS

## 2023-05-04 MED ORDER — LACTATED RINGERS IV BOLUS
1000.0000 mL | Freq: Once | INTRAVENOUS | Status: AC
  Administered 2023-05-04: 1000 mL via INTRAVENOUS

## 2023-05-04 MED ORDER — INSULIN ASPART 100 UNIT/ML IJ SOLN
40.0000 [IU] | Freq: Every day | INTRAMUSCULAR | Status: DC
Start: 2023-05-05 — End: 2023-05-10
  Administered 2023-05-05 – 2023-05-10 (×6): 40 [IU] via SUBCUTANEOUS

## 2023-05-04 MED ORDER — PANTOPRAZOLE SODIUM 40 MG PO TBEC
40.0000 mg | DELAYED_RELEASE_TABLET | Freq: Every day | ORAL | Status: DC
  Administered 2023-05-05 – 2023-05-10 (×6): 40 mg via ORAL
  Filled 2023-05-04 (×6): qty 1

## 2023-05-04 MED ORDER — AZELASTINE HCL 0.1 % NA SOLN
2.0000 | Freq: Every day | NASAL | Status: DC
  Administered 2023-05-05 – 2023-05-10 (×6): 2 via NASAL
  Filled 2023-05-04: qty 30

## 2023-05-04 MED ORDER — EVOLOCUMAB 140 MG/ML ~~LOC~~ SOSY: 140.0000 mg | PREFILLED_SYRINGE | SUBCUTANEOUS | Status: DC

## 2023-05-04 MED ORDER — IVABRADINE HCL 5 MG PO TABS
7.5000 mg | ORAL_TABLET | Freq: Every day | ORAL | Status: DC
  Administered 2023-05-05 – 2023-05-09 (×5): 7.5 mg via ORAL
  Filled 2023-05-04 (×5): qty 1

## 2023-05-04 MED ORDER — TRETINOIN 0.05 % EX CREA: 1.0000 | TOPICAL_CREAM | Freq: Every day | CUTANEOUS | Status: DC

## 2023-05-04 MED ORDER — LORATADINE 10 MG PO TABS
10.0000 mg | ORAL_TABLET | Freq: Every day | ORAL | Status: DC
Start: 2023-05-05 — End: 2023-05-07
  Administered 2023-05-05 – 2023-05-07 (×3): 10 mg via ORAL
  Filled 2023-05-04 (×3): qty 1

## 2023-05-04 MED ORDER — INSULIN ASPART 100 UNIT/ML IJ SOLN
0.0000 [IU] | Freq: Three times a day (TID) | INTRAMUSCULAR | Status: DC
  Administered 2023-05-05: 3 [IU] via SUBCUTANEOUS
  Administered 2023-05-05: 5 [IU] via SUBCUTANEOUS
  Administered 2023-05-05: 2 [IU] via SUBCUTANEOUS
  Administered 2023-05-06: 5 [IU] via SUBCUTANEOUS
  Administered 2023-05-06: 1 [IU] via SUBCUTANEOUS
  Administered 2023-05-06: 2 [IU] via SUBCUTANEOUS
  Administered 2023-05-07: 1 [IU] via SUBCUTANEOUS
  Administered 2023-05-07: 3 [IU] via SUBCUTANEOUS
  Administered 2023-05-08: 2 [IU] via SUBCUTANEOUS
  Administered 2023-05-08: 3 [IU] via SUBCUTANEOUS
  Administered 2023-05-08: 2 [IU] via SUBCUTANEOUS
  Administered 2023-05-09: 5 [IU] via SUBCUTANEOUS
  Administered 2023-05-09 (×2): 2 [IU] via SUBCUTANEOUS
  Administered 2023-05-10: 7 [IU] via SUBCUTANEOUS

## 2023-05-04 MED ORDER — HYDROMORPHONE HCL 1 MG/ML IJ SOLN
0.5000 mg | INTRAMUSCULAR | Status: AC | PRN
  Administered 2023-05-04 – 2023-05-05 (×2): 0.5 mg via INTRAVENOUS
  Filled 2023-05-04 (×2): qty 0.5

## 2023-05-04 MED ORDER — LABETALOL HCL 5 MG/ML IV SOLN
10.0000 mg | Freq: Once | INTRAVENOUS | Status: AC
  Administered 2023-05-04: 10 mg via INTRAVENOUS
  Filled 2023-05-04: qty 4

## 2023-05-04 MED ORDER — SODIUM BICARBONATE 650 MG PO TABS
1300.0000 mg | ORAL_TABLET | Freq: Two times a day (BID) | ORAL | Status: DC
  Administered 2023-05-04 – 2023-05-10 (×12): 1300 mg via ORAL
  Filled 2023-05-04 (×12): qty 2

## 2023-05-04 MED ORDER — HYDRALAZINE HCL 25 MG PO TABS
50.0000 mg | ORAL_TABLET | Freq: Three times a day (TID) | ORAL | Status: DC
  Administered 2023-05-04 – 2023-05-10 (×16): 50 mg via ORAL
  Filled 2023-05-04 (×17): qty 2

## 2023-05-04 MED ORDER — INSULIN ASPART 100 UNIT/ML IJ SOLN: 30.0000 [IU] | Freq: Three times a day (TID) | INTRAMUSCULAR | Status: DC

## 2023-05-04 MED ORDER — SUMATRIPTAN SUCCINATE 25 MG PO TABS: 25.0000 mg | ORAL_TABLET | ORAL | Status: DC | PRN

## 2023-05-04 MED ORDER — ACETAMINOPHEN 325 MG PO TABS: 650.0000 mg | ORAL_TABLET | Freq: Four times a day (QID) | ORAL | Status: DC | PRN

## 2023-05-04 MED ORDER — FENOFIBRATE 54 MG PO TABS
54.0000 mg | ORAL_TABLET | Freq: Every day | ORAL | Status: DC
Start: 2023-05-04 — End: 2023-05-10
  Administered 2023-05-04 – 2023-05-09 (×6): 54 mg via ORAL
  Filled 2023-05-04 (×7): qty 1

## 2023-05-04 MED ORDER — TOPIRAMATE 25 MG PO TABS
50.0000 mg | ORAL_TABLET | Freq: Two times a day (BID) | ORAL | Status: DC
  Administered 2023-05-04 – 2023-05-10 (×12): 50 mg via ORAL
  Filled 2023-05-04 (×12): qty 2

## 2023-05-04 MED ORDER — LEVOTHYROXINE SODIUM 25 MCG PO TABS
137.0000 ug | ORAL_TABLET | Freq: Every day | ORAL | Status: DC
  Administered 2023-05-05 – 2023-05-10 (×6): 137 ug via ORAL
  Filled 2023-05-04 (×6): qty 1

## 2023-05-04 MED ORDER — ACETAMINOPHEN 500 MG PO TABS
1000.0000 mg | ORAL_TABLET | Freq: Once | ORAL | Status: AC
  Administered 2023-05-04: 1000 mg via ORAL
  Filled 2023-05-04: qty 2

## 2023-05-04 MED ORDER — ROSUVASTATIN CALCIUM 20 MG PO TABS
40.0000 mg | ORAL_TABLET | Freq: Every day | ORAL | Status: DC
  Administered 2023-05-05 – 2023-05-10 (×6): 40 mg via ORAL
  Filled 2023-05-04 (×6): qty 2

## 2023-05-04 MED ORDER — NORETHIN ACE-ETH ESTRAD-FE 1.5-30 MG-MCG PO TABS
1.0000 | ORAL_TABLET | Freq: Every day | ORAL | Status: DC
  Administered 2023-05-05 – 2023-05-10 (×6): 1 via ORAL
  Filled 2023-05-04 (×2): qty 1

## 2023-05-04 MED ORDER — INSULIN ASPART 100 UNIT/ML IJ SOLN
0.0000 [IU] | Freq: Every day | INTRAMUSCULAR | Status: DC
  Administered 2023-05-04 – 2023-05-05 (×2): 2 [IU] via SUBCUTANEOUS
  Administered 2023-05-06: 4 [IU] via SUBCUTANEOUS
  Administered 2023-05-08: 3 [IU] via SUBCUTANEOUS

## 2023-05-04 MED ORDER — DIPHENHYDRAMINE HCL 25 MG PO CAPS
25.0000 mg | ORAL_CAPSULE | Freq: Once | ORAL | Status: AC | PRN
  Administered 2023-05-05: 25 mg via ORAL
  Filled 2023-05-04: qty 1

## 2023-05-04 MED ORDER — INSULIN GLARGINE-YFGN 100 UNIT/ML ~~LOC~~ SOLN
15.0000 [IU] | Freq: Every day | SUBCUTANEOUS | Status: DC
  Administered 2023-05-04: 15 [IU] via SUBCUTANEOUS
  Filled 2023-05-04 (×2): qty 0.15

## 2023-05-04 MED ORDER — BUTALBITAL-APAP-CAFFEINE 50-325-40 MG PO TABS: 1.0000 | ORAL_TABLET | Freq: Every day | ORAL | Status: DC | PRN

## 2023-05-04 MED ORDER — ACETAMINOPHEN 325 MG PO TABS
650.0000 mg | ORAL_TABLET | Freq: Once | ORAL | Status: AC
  Administered 2023-05-04: 650 mg via ORAL
  Filled 2023-05-04: qty 2

## 2023-05-04 MED ORDER — AMITRIPTYLINE HCL 10 MG PO TABS
10.0000 mg | ORAL_TABLET | Freq: Every day | ORAL | Status: DC
  Administered 2023-05-04 – 2023-05-09 (×6): 10 mg via ORAL
  Filled 2023-05-04 (×7): qty 1

## 2023-05-04 MED ORDER — DULOXETINE HCL 60 MG PO CPEP
60.0000 mg | ORAL_CAPSULE | Freq: Every day | ORAL | Status: DC
  Administered 2023-05-04 – 2023-05-09 (×6): 60 mg via ORAL
  Filled 2023-05-04 (×6): qty 1

## 2023-05-04 MED ORDER — PREGABALIN 100 MG PO CAPS
300.0000 mg | ORAL_CAPSULE | Freq: Two times a day (BID) | ORAL | Status: DC
  Administered 2023-05-04 – 2023-05-10 (×12): 300 mg via ORAL
  Filled 2023-05-04 (×12): qty 3

## 2023-05-04 MED ORDER — PANCRELIPASE (LIP-PROT-AMYL) 36000-114000 UNITS PO CPEP
36000.0000 [IU] | ORAL_CAPSULE | Freq: Three times a day (TID) | ORAL | Status: DC
  Administered 2023-05-05 – 2023-05-06 (×6): 36000 [IU] via ORAL
  Filled 2023-05-04 (×8): qty 1

## 2023-05-04 MED ORDER — ALBUTEROL SULFATE (2.5 MG/3ML) 0.083% IN NEBU
10.0000 mg | INHALATION_SOLUTION | Freq: Once | RESPIRATORY_TRACT | Status: AC
  Administered 2023-05-04: 10 mg via RESPIRATORY_TRACT
  Filled 2023-05-04: qty 12

## 2023-05-04 MED ORDER — SODIUM CHLORIDE 0.9 % IV SOLN
1.0000 g | INTRAVENOUS | Status: DC
  Administered 2023-05-04 – 2023-05-08 (×5): 1 g via INTRAVENOUS
  Filled 2023-05-04 (×5): qty 10

## 2023-05-04 MED ORDER — ACETAMINOPHEN 650 MG RE SUPP: 650.0000 mg | Freq: Four times a day (QID) | RECTAL | Status: DC | PRN

## 2023-05-04 MED ORDER — HYDRALAZINE HCL 20 MG/ML IJ SOLN: 10.0000 mg | INTRAMUSCULAR | Status: DC | PRN

## 2023-05-04 MED ORDER — HYDROCODONE-ACETAMINOPHEN 5-325 MG PO TABS
1.0000 | ORAL_TABLET | ORAL | Status: DC | PRN
  Administered 2023-05-04 – 2023-05-09 (×16): 1 via ORAL
  Filled 2023-05-04 (×17): qty 1

## 2023-05-04 MED ORDER — CALCIUM GLUCONATE-NACL 1-0.675 GM/50ML-% IV SOLN
1.0000 g | Freq: Once | INTRAVENOUS | Status: AC
  Administered 2023-05-04: 1000 mg via INTRAVENOUS
  Filled 2023-05-04: qty 50

## 2023-05-04 MED ORDER — CARVEDILOL 3.125 MG PO TABS
3.1250 mg | ORAL_TABLET | Freq: Two times a day (BID) | ORAL | Status: DC
  Administered 2023-05-04 – 2023-05-10 (×12): 3.125 mg via ORAL
  Filled 2023-05-04 (×12): qty 1

## 2023-05-04 MED ORDER — FUROSEMIDE 10 MG/ML IJ SOLN
40.0000 mg | Freq: Once | INTRAMUSCULAR | Status: AC
  Administered 2023-05-04: 40 mg via INTRAVENOUS
  Filled 2023-05-04: qty 4

## 2023-05-04 MED ORDER — INSULIN ASPART 100 UNIT/ML IJ SOLN
10.0000 [IU] | Freq: Once | INTRAMUSCULAR | Status: AC
  Administered 2023-05-04: 10 [IU] via SUBCUTANEOUS

## 2023-05-04 MED ORDER — LABETALOL HCL 5 MG/ML IV SOLN: 20.0000 mg | INTRAVENOUS | Status: DC | PRN

## 2023-05-04 NOTE — Assessment & Plan Note (Addendum)
 Stable reason for observation this evening.  Seems to have improved with Lokelma , calcium  gluconate and insulin  with dextrose .   renal consult in chart. Patient curenlty ordered for lokelma  TID. I will reduce frequency to daily.

## 2023-05-04 NOTE — Progress Notes (Signed)
 @Patient  ID: Delon Freestone, female    DOB: February 14, 1991, 33 y.o.   MRN: [id number]  Chief Complaint  Patient presents with   Follow-up    Referring provider: [name]  HPI: 33 year old female, never smoked.  Past medical history significant for congestive heart failure, hypertension, pulmonary hypertension, cardial infarction, hepatic steatosis, gastroparesis, type 1 diabetes, hypothyroidism, kidney disease.  05/04/2023 Discussed the use of AI scribe software for clinical note transcription with the patient, who gave verbal consent to proceed.  History of Present Illness   Kaysa Roulhac is a 33 year old female who presents with difficulty sleeping. She was referred by Joesph Emilio, her primary care doctor, for evaluation of sleep disturbances.  She has been experiencing persistent difficulty sleeping, characterized by trouble both falling and staying asleep. Her typical bedtime is between 10:00 and 10:30 PM, but it takes her 30 to 40 minutes to fall asleep. She wakes up 30 to 40 times a night, often gasping for air. No snoring, but if she starts snoring, she usually wakes up. Her weight has been increasing over the past two years without a clear explanation, which she suspects might be related to fluid retention.  There is a concern for sleep apnea due to episodes of oxygen desaturation observed during hospital stays, where she wakes up gasping for air and dreams of being underwater. However, multiple home sleep studies, including the most recent one in October 2023, have returned negative results for sleep apnea. She has not undergone an in-lab sleep study.  She has a history of hypertension, with recent blood pressure readings elevated at 160/90-100 mmHg. She experiences symptoms such as hot flashes and headaches when her blood pressure is high. She monitors her blood pressure at home and is managed by her cardiologist.      Allergies  Allergen Reactions   Icosapent   Ethyl (Epa Ethyl Ester) (Fish)     Hives w/Vascepa    Ketamine Other (See Comments)    In vegetative state for 15 minutes per pt   [place] Itching    Itchy throat  Itchy throat Itchy throat Itchy throat Itchy throat    Itchy throat  Itchy throat Itchy throat Itchy throat Itchy throat    Itchy throat   Morphine  Itching, Rash and Hives    Sorethroat   Penicillins Hives, Itching and Rash    Has patient had a PCN reaction causing immediate rash, facial/tongue/throat swelling, SOB or lightheadedness with hypotension: Y Has patient had a PCN reaction causing severe rash involving mucus membranes or skin necrosis: Y Has patient had a PCN reaction that required hospitalization: N Has patient had a PCN reaction occurring within the last 10 years: Y   Clarithromycin Other (See Comments)    Stomach pain   Erythromycin      Stomach pain   Fentanyl  Nausea And Vomiting   Penicillin V Hives, Itching and Other (See Comments)    Gi upset, also   Shellfish Allergy Other (See Comments)    Pt has never had shellfish but tested positive on allergy test. Pt states contrast in CT is okay   Prednisone Rash    Immunization History  Administered Date(s) Administered   Fluzone Influenza virus vaccine,trivalent (IIV3), split virus 01/07/2019   Hepb-cpg 05/12/2021, 07/12/2021   Influenza, Mdck, Trivalent,PF 6+ MOS(egg free) 01/12/2023   Influenza,inj,Quad PF,6+ Mos 01/07/2019, 01/02/2020   Influenza-Unspecified 01/07/2019, 01/02/2020, 12/10/2020, 11/24/2021   PNEUMOCOCCAL CONJUGATE-20 05/12/2021   PPD Test 09/08/2020, 11/08/2022   Pneumococcal Conjugate-13 03/29/2020, 12/10/2020  Pneumococcal Polysaccharide-23 01/07/2019, 09/09/2022   Pneumococcal-Unspecified 09/09/2022   Tdap 01/07/2019    Past Medical History:  Diagnosis Date   Afib (HCC) 05/12/2021   Brain tumor (HCC) 03/29/1995   astrocytoma   CHF (congestive heart failure) (HCC)    Cholesterosis    CKD (chronic kidney disease) stage 4,  GFR 15-29 ml/min (HCC) 05/13/2021   DM (diabetes mellitus) (HCC) 10/10/2018   Fatty liver    HTN (hypertension) 10/10/2018   Hypothyroidism 10/10/2018   Lipoprotein deficiency    Lung disease    longevity long term   Pancreatitis    Polycystic ovary syndrome     Tobacco History: Social History   Tobacco Use  Smoking Status Never  Smokeless Tobacco Never   Counseling given: Not Answered   Outpatient Medications Prior to Visit  Medication Sig Dispense Refill   amitriptyline  (ELAVIL ) 10 MG tablet Take 10 mg by mouth at bedtime.     aspirin  EC 81 MG EC tablet Take 1 tablet (81 mg total) by mouth daily. Swallow whole. 30 tablet 1   azelastine  (ASTELIN ) 0.1 % nasal spray Place 2 sprays into both nostrils daily at 6 (six) AM.     BLISOVI  FE 1.5/30 1.5-30 MG-MCG tablet Take 1 tablet by mouth daily.     butalbital -acetaminophen -caffeine  (FIORICET ) 50-325-40 MG tablet Take 1 tablet by mouth daily as needed for headache.     carvedilol  (COREG ) 3.125 MG tablet Take 1 tablet (3.125 mg total) by mouth 2 (two) times daily. 60 tablet 11   CRANBERRY CONCENTRATE PO Take 2 tablets by mouth 2 (two) times daily.     CREON  36000-114000 units CPEP capsule Take 72,000 Units by mouth 3 (three) times daily.     DULoxetine  (CYMBALTA ) 60 MG capsule Take 60 mg by mouth at bedtime.     ergocalciferol  (VITAMIN D2) 1.25 MG (50000 UT) capsule Take 50,000 Units by mouth every 7 (seven) days. Every Sunday     Evolocumab  (REPATHA ) 140 MG/ML SOSY Inject 140 mg into the skin every Saturday. Inject 140 mg subcutaneously every other Wednesday evening     fenofibrate  54 MG tablet Take 1 tablet (54 mg total) by mouth daily. 30 tablet 11   hydrALAZINE  (APRESOLINE ) 100 MG tablet Take 1 tablet (100 mg total) by mouth 3 (three) times daily. 90 tablet 0   insulin  lispro (HUMALOG ) 100 UNIT/ML injection Inject 30-45 Units into the skin 3 (three) times daily before meals. Inject subcutaneously 45 units with breakfast, 30 units  with lunch and 30 units with dinner     Insulin  Pen Needle 32G X 4 MM MISC 1 Device by Does not apply route QID. For use with insulin  pens 100 each 0   insulin  regular human CONCENTRATED (HUMULIN  R U-500 KWIKPEN) 500 UNIT/ML KwikPen Inject 185 Units into the skin 3 (three) times daily with meals. (Patient taking differently: Inject 185-195 Units into the skin 3 (three) times daily with meals. Inject subcutaneously 195 units with breakfast and lunch and 185 units with dinner) 33.3 mL 0   ivabradine  (CORLANOR ) 7.5 MG TABS tablet Take 1 tablet (7.5 mg total) by mouth 2 (two) times daily with a meal. 60 tablet 6   levocetirizine (XYZAL ) 5 MG tablet Take 5 mg by mouth daily.     levothyroxine  (SYNTHROID ) 150 MCG tablet Take 1 tablet (150 mcg total) by mouth daily before breakfast. 30 tablet 1   pantoprazole  (PROTONIX ) 40 MG tablet Take 40 mg by mouth daily.     pregabalin  (LYRICA ) 300  MG capsule Take 300 mg by mouth 2 (two) times daily.     prochlorperazine  (COMPAZINE ) 5 MG tablet Take 5 mg by mouth at bedtime.     rosuvastatin  (CRESTOR ) 40 MG tablet Take 40 mg by mouth daily.     sodium bicarbonate  650 MG tablet Take 1,300 mg by mouth 2 (two) times daily.     SUMAtriptan  (IMITREX ) 25 MG tablet Take 25 mg by mouth every 2 (two) hours as needed.     topiramate  (TOPAMAX ) 50 MG tablet Take 50 mg by mouth 2 (two) times daily.     torsemide  (DEMADEX ) 20 MG tablet Take 2 tablets (40 mg total) by mouth daily. May take 1 additional tablet as needed 90 tablet 2   tretinoin  (RETIN-A ) 0.05 % cream Apply 1 application topically at bedtime.     No facility-administered medications prior to visit.   Review of Systems  Review of Systems  Constitutional:  Positive for fatigue.  Psychiatric/Behavioral:  Positive for sleep disturbance.    Physical Exam  BP (!) 160/90 (BP Location: Right Arm, Patient Position: Sitting, Cuff Size: Normal) Comment: Manual bp  Pulse (!) 119   Temp (!) 96.9 F (36.1 C) (Temporal)    Ht 4' 9 (1.448 m)   Wt 144 lb (65.3 kg)   LMP 03/22/2023   SpO2 97%   BMI 31.16 kg/m  Physical Exam Constitutional:      General: [name] not in acute distress.    Appearance: Normal appearance. [name] not ill-appearing.  HENT:     Head: Normocephalic and atraumatic.  Cardiovascular:     Rate and Rhythm: Regular rhythm. Tachycardia present.  Pulmonary:     Effort: Pulmonary effort is normal.     Breath sounds: Normal breath sounds. No wheezing or rhonchi.  Skin:    General: Skin is warm and dry.  Neurological:     General: No focal deficit present.     Mental Status: [name] alert and oriented to person, place, and time. Mental status is at baseline.  Psychiatric:        Mood and Affect: Mood normal.        Behavior: Behavior normal.        Thought Content: Thought content normal.        Judgment: Judgment normal.      Lab Results:  CBC    Component Value Date/Time   WBC 7.3 04/28/2023 1528   RBC 4.32 04/28/2023 1528   HGB 13.2 04/28/2023 1528   HCT 40.3 04/28/2023 1528   PLT 213 04/28/2023 1528   MCV 93.3 04/28/2023 1528   MCH 30.6 04/28/2023 1528   MCHC 32.8 04/28/2023 1528   RDW 15.6 (H) 04/28/2023 1528   LYMPHSABS 2.4 04/19/2023 1517   MONOABS 0.4 04/19/2023 1517   EOSABS 0.1 04/19/2023 1517   BASOSABS 0.0 04/19/2023 1517    BMET    Component Value Date/Time   NA 138 04/28/2023 1528   NA 132 (L) 11/19/2021 1058   K 4.3 04/28/2023 1528   CL 104 04/28/2023 1528   CO2 23 04/28/2023 1528   GLUCOSE 69 (L) 04/28/2023 1528   BUN 52 (H) 04/28/2023 1528   BUN 102 (HH) 11/19/2021 1058   CREATININE 2.23 (H) 04/28/2023 1528   CALCIUM  10.1 04/28/2023 1528   GFRNONAA 29 (L) 04/28/2023 1528   GFRAA 68 08/22/2019 0914    BNP    Component Value Date/Time   BNP 34.9 04/21/2023 0002    ProBNP No results  found for: PROBNP  Imaging: US  Renal Result Date: 04/28/2023 CLINICAL DATA:  Bilateral flank pain EXAM: RENAL / URINARY TRACT ULTRASOUND COMPLETE  COMPARISON:  None Available. FINDINGS: Right Kidney: Renal measurements: 10.4 x 4.7 x 4.2 cm = volume: 106 mL. Mild diffuse renal cortical atrophy. Echogenicity within normal limits. No mass or hydronephrosis visualized. Left Kidney: Renal measurements: 10.4 x 5.2 x 4.3 cm = volume: 122 mL. Minimal diffuse renal cortical atrophy. Echogenicity within normal limits. No mass or hydronephrosis visualized. Bladder: Appears normal for degree of bladder distention. Bilateral ureteral jets are Other: At least mild hepatic steatosis is incidentally noted. IMPRESSION: 1. Mild bilateral renal cortical atrophy, right greater than left. Otherwise unremarkable renal sonogram. 2. At least mild hepatic steatosis. Electronically Signed   By: Dorethia Molt M.D.   On: 04/28/2023 22:10   DG Chest 2 View Result Date: 04/21/2023 CLINICAL DATA:  Shortness of breath EXAM: CHEST - 2 VIEW COMPARISON:  04/10/2023 FINDINGS: Heart and mediastinal contours are within normal limits. No focal opacities or effusions. No acute bony abnormality. IMPRESSION: No active cardiopulmonary disease. Electronically Signed   By: Franky Crease M.D.   On: 04/21/2023 00:28   DG Chest Portable 1 View Result Date: 04/10/2023 CLINICAL DATA:  Fall.  Numbness and tingling. EXAM: PORTABLE CHEST 1 VIEW COMPARISON:  01/17/2023. FINDINGS: Low lung volume. Bilateral lung fields are clear. Bilateral costophrenic angles are clear. Stable cardio-mediastinal silhouette. Redemonstration of metallic clip overlying the left lower lung zone. No acute osseous abnormalities. The soft tissues are within normal limits. IMPRESSION: *No active disease. Electronically Signed   By: Ree Molt M.D.   On: 04/10/2023 14:23   CT Head Wo Contrast Result Date: 04/10/2023 CLINICAL DATA:  Provided history: History of brain tumor. Numbness and tingling all over body. Fall. EXAM: CT HEAD WITHOUT CONTRAST TECHNIQUE: Contiguous axial images were obtained from the base of the skull  through the vertex without intravenous contrast. RADIATION DOSE REDUCTION: This exam was performed according to the departmental dose-optimization program which includes automated exposure control, adjustment of the mA and/or kV according to patient size and/or use of iterative reconstruction technique. COMPARISON:  Prior head CT examinations 09/05/2022 and earlier. Brain MRI 12/06/2018. FINDINGS: Please note, there is limited assessment for residual/recurrent tumor on this non-contrast head CT. Brain: No age-advanced or lobar predominant parenchymal atrophy. Foci of chronic encephalomalacia/gliosis within the anterior frontal lobes bilaterally. Ill-defined focus of hyperdensity at the level of the midbrain, unchanged from the prior head CT of 09/05/2022. Chronic blood products were present at this site on the prior brain MRI of 12/06/2018. There is no acute intracranial hemorrhage. No demarcated cortical infarct. No extra-axial fluid collection. No midline shift. Vascular: No hyperdense vessel.  Atherosclerotic calcifications. Skull: Post-operative changes from prior right temporal craniotomy. Sinuses/Orbits: No mass or acute finding within the imaged orbits. No significant paranasal sinus disease at the imaged levels. IMPRESSION: 1. No acute intracranial hemorrhage or evidence of an acute infarct. 2. Ill-defined focus of hyperdensity at the level of the midbrain, unchanged from the prior head CT of 09/05/2022. This likely corresponds with the chronic blood products demonstrated at this site on the prior brain MRI of 12/06/2018. Please note, assessment for residual/recurrent tumor is limited on this non-contrast head CT. 3. Foci of chronic encephalomalacia/gliosis again noted within the anterior frontal lobes bilaterally, likely postsurgical/postprocedural. Electronically Signed   By: Rockey Childs D.O.   On: 04/10/2023 14:03     Assessment & Plan:   1. Witnessed episode of  apnea (Primary) - Polysomnography 4  or more parameters (NPSG); Future  2. Nocturnal hypoxemia - Polysomnography 4 or more parameters (NPSG); Future  3. Hypertension, unspecified type     Suspected Sleep Apnea Patient reports difficulty falling asleep and staying asleep, waking up gasping for air, and nocturnal hypoxemia noted during hospital stays. Previous home sleep studies have been negative. Weight has increased since last sleep study. -Order in-lab sleep study to evaluate for sleep apnea and nocturnal hypoxemia. -Discussed potential treatment options including CPAP, oral appliance, and nocturnal oxygen, depending on the results of the sleep study. She does not feel that she would be able to tolerate CPAP due to phobia. She would be open to oral appliance.  Hypertension Elevated blood pressure noted during visit (160/100), despite medication. Patient reports recent issues with high blood pressure. Patient reports symptoms headache and blurred vision.  -EMS to transport patient to emergency room for immediate evaluation and management of elevated blood pressure. -Recommend follow-up with cardiologist for ongoing management of hypertension.      Almarie LELON Ferrari, NP 05/04/2023

## 2023-05-04 NOTE — Assessment & Plan Note (Signed)
 C/w creon

## 2023-05-04 NOTE — ED Triage Notes (Signed)
 Pt BIB [facility] from PCP office with reports of hypertension, headache, dizziness, and LLQ abdominal pain that is tender to palpation. Denies N/V/D.

## 2023-05-04 NOTE — Patient Instructions (Signed)
-  SUSPECTED SLEEP APNEA: Sleep apnea is a condition where breathing repeatedly stops and starts during sleep. We will schedule an in-lab sleep study to better understand your sleep disturbances and determine if you have sleep apnea. Depending on the results, treatment options may include CPAP, using an oral appliance, or nocturnal oxygen.  -HYPERTENSION: Hypertension, or high blood pressure, is when the force of the blood against your artery walls is too high. Your blood pressure was elevated today at 160/90-100. We recommend that you go to the emergency room for immediate evaluation and management. Please follow up with your cardiologist for ongoing care.  INSTRUCTIONS:  Please go to the emergency room immediately for evaluation and management of your elevated blood pressure. Additionally, we will schedule an in-lab sleep study to further investigate your sleep disturbances. Follow up with your cardiologist for continued management of your hypertension.  Follow-up 3 months with Landry NP

## 2023-05-04 NOTE — Progress Notes (Signed)
 Pt c/o stomach pain at 8/10 and is asking for something else for it. Pt states Norco did not do anything. RN messaged MD on call.   Jennifer [name]

## 2023-05-04 NOTE — Consult Note (Addendum)
 Reason for Consult:Hyperkalemia in CKD  patient  Referring Physician: Mannie Fairy DASEN, DO   Jennifer [name] is an 33 y.o. female. Patient has a history of CKD stage IV, hypertension, diabetes mellitus, congestive heart failure who was brought in by EMS from his PCPs office due to concerns for elevated blood pressure ,headache, dizziness and left lower quadrant abdominal pain. She had nausea but denies any associated vomiting or diarrhea.  Her home medications include hydralazine , Coreg , aspirin , amitriptyline , insulin , torsemide , sumatriptan , and Crestor .  She used to follow up with [facility] for management of her CKD, had a kidney biopsy in March 2023 that was completely unremarkable.  On admission her creatinine was 1.83 < 2.23 << 2.40  from 2 weeks ago.She also has a  potassium of 7.7 with a blood glucose of 365 and  nephrology was consulted. Patient reports she used to have episode of hyperkalemia in the past and was taken off her Spironolactone  and that has since resolved until this episode.   Trend in Creatinine: Creatinine, Ser  Date/Time Value Ref Range Status  05/04/2023 01:02 PM 2.10 (H) 0.44 - 1.00 mg/dL Final  97/93/7974 88:54 AM 1.83 (H) 0.44 - 1.00 mg/dL Final  98/68/7974 96:71 PM 2.23 (H) 0.44 - 1.00 mg/dL Final  98/75/7974 87:97 AM 2.03 (H) 0.44 - 1.00 mg/dL Final  98/77/7974 96:82 PM 2.14 (H) 0.44 - 1.00 mg/dL Final  98/84/7974 92:42 AM 1.70 (H) 0.44 - 1.00 mg/dL Final  98/85/7974 95:77 AM 2.40 (H) 0.44 - 1.00 mg/dL Final    Comment:    POST-ULTRACENTRIFUGATION  04/10/2023 08:32 PM 2.10 (H) 0.44 - 1.00 mg/dL Final  98/86/7974 96:88 PM 2.05 (H) 0.44 - 1.00 mg/dL Final  98/86/7974 87:70 PM 1.93 (H) 0.44 - 1.00 mg/dL Final  89/75/7975 96:69 PM 1.75 (H) 0.44 - 1.00 mg/dL Final    Comment:    POST-ULTRACENTRIFUGATION HEMOLYSIS AT THIS LEVEL MAY AFFECT RESULT RESULT RELEASED PER NATALIE WARDEN RN   01/19/2023 09:45 AM 1.80 (H) 0.44 - 1.00 mg/dL Final  89/75/7975 87:67 AM 1.43 (H)  0.44 - 1.00 mg/dL Final  89/76/7975 95:75 PM 2.03 (H) 0.44 - 1.00 mg/dL Final    Comment:    POST-ULTRACENTRIFUGATION  01/18/2023 08:57 AM 2.11 (H) 0.44 - 1.00 mg/dL Final  89/77/7975 91:98 PM 1.04 (H) 0.44 - 1.00 mg/dL Final    Comment:    POST-ULTRACENTRIFUGATION  01/12/2023 09:30 PM 1.53 (H) 0.44 - 1.00 mg/dL Final  89/85/7975 95:84 AM 1.52 (H) 0.44 - 1.00 mg/dL Final  89/85/7975 87:71 AM 1.49 (H) 0.44 - 1.00 mg/dL Final  89/90/7975 91:44 AM 1.96 (H) 0.44 - 1.00 mg/dL Final    Comment:    POST-ULTRACENTRIFUGATION  01/04/2023 05:50 AM 1.75 (H) 0.44 - 1.00 mg/dL Final  89/91/7975 94:79 AM 1.34 (H) 0.44 - 1.00 mg/dL Final  89/92/7975 92:59 PM 1.11 (H) 0.44 - 1.00 mg/dL Final    Comment:    POST-ULTRACENTRIFUGATION  01/02/2023 02:38 PM 1.50 (H) 0.44 - 1.00 mg/dL Final  89/92/7975 88:91 AM 1.44 (H) 0.44 - 1.00 mg/dL Final    Comment:    POST-ULTRACENTRIFUGATION  10/17/2022 09:30 PM 1.43 (H) 0.44 - 1.00 mg/dL Final  92/89/7975 94:90 AM 2.62 (H) 0.44 - 1.00 mg/dL Final  92/90/7975 87:84 PM 2.27 (H) 0.44 - 1.00 mg/dL Final  93/74/7975 94:84 AM 1.61 (H) 0.44 - 1.00 mg/dL Final  93/75/7975 87:75 PM 1.92 (H) 0.44 - 1.00 mg/dL Final  93/75/7975 98:62 AM 2.08 (H) 0.44 - 1.00 mg/dL Final  93/79/7975 98:94 AM 2.10 (  H) 0.44 - 1.00 mg/dL Final  93/80/7975 88:65 PM 2.04 (H) 0.44 - 1.00 mg/dL Final  93/81/7975 97:43 AM 1.93 (H) 0.44 - 1.00 mg/dL Final  93/82/7975 96:91 AM 2.32 (H) 0.44 - 1.00 mg/dL Final  93/83/7975 97:54 AM 2.27 (H) 0.44 - 1.00 mg/dL Final  93/84/7975 95:59 PM 2.19 (H) 0.44 - 1.00 mg/dL Final  93/84/7975 96:93 AM 2.18 (H) 0.44 - 1.00 mg/dL Final  93/85/7975 95:44 PM 2.37 (H) 0.44 - 1.00 mg/dL Final    Comment:    POST-ULTRACENTRIFUGATION  09/09/2022 06:33 AM 2.14 (H) 0.44 - 1.00 mg/dL Final  93/85/7975 96:93 AM 2.07 (H) 0.44 - 1.00 mg/dL Final    Comment:    POST-ULTRACENTRIFUGATION  09/08/2022 11:05 PM 1.89 (H) 0.44 - 1.00 mg/dL Final  93/86/7975 93:44 PM 1.90 (H) 0.44  - 1.00 mg/dL Final  93/86/7975 96:77 PM 1.81 (H) 0.44 - 1.00 mg/dL Final  93/86/7975 87:90 PM 1.83 (H) 0.44 - 1.00 mg/dL Final  93/86/7975 91:72 AM 1.75 (H) 0.44 - 1.00 mg/dL Final  93/86/7975 97:43 AM 1.96 (H) 0.44 - 1.00 mg/dL Final    Comment:    POST-ULTRACENTRIFUGATION  09/07/2022 11:41 PM 1.93 (H) 0.44 - 1.00 mg/dL Final    Comment:    POST-ULTRACENTRIFUGATION  09/07/2022 06:54 PM 1.81 (H) 0.44 - 1.00 mg/dL Final    Comment:    POST-ULTRACENTRIFUGATION  09/07/2022 03:10 PM 1.80 (H) 0.44 - 1.00 mg/dL Final  93/87/7975 87:43 PM 1.76 (H) 0.44 - 1.00 mg/dL Final  93/87/7975 92:75 AM 1.88 (H) 0.44 - 1.00 mg/dL Final    PMH:   Past Medical History:  Diagnosis Date   Afib (HCC) 05/12/2021   Brain tumor (HCC) 03/29/1995   astrocytoma   CHF (congestive heart failure) (HCC)    Cholesterosis    CKD (chronic kidney disease) stage 4, GFR 15-29 ml/min (HCC) 05/13/2021   DM (diabetes mellitus) (HCC) 10/10/2018   Fatty liver    HTN (hypertension) 10/10/2018   Hypothyroidism 10/10/2018   Lipoprotein deficiency    Lung disease    longevity long term   Pancreatitis    Polycystic ovary syndrome     PSH:   Past Surgical History:  Procedure Laterality Date   ABDOMINAL SURGERY     pt states during miscarriage got her intestine   BRAIN SURGERY     EYE MUSCLE SURGERY Right 03/28/2014   PRESSURE SENSOR/CARDIOMEMS N/A 12/06/2021   Procedure: PRESSURE SENSOR/CARDIOMEMS;  Surgeon: [name] Toribio SAUNDERS, MD;  Location: [facility];  Service: Cardiovascular;  Laterality: N/A;   RIGHT HEART CATH N/A 08/05/2021   Procedure: RIGHT HEART CATH;  Surgeon: [name] Toribio SAUNDERS, MD;  Location: [facility];  Service: Cardiovascular;  Laterality: N/A;   RIGHT HEART CATH N/A 12/06/2021   Procedure: RIGHT HEART CATH;  Surgeon: [name] Toribio SAUNDERS, MD;  Location: [facility];  Service: Cardiovascular;  Laterality: N/A;   RIGHT/LEFT HEART CATH AND CORONARY ANGIOGRAPHY N/A 12/03/2020    Procedure: RIGHT/LEFT HEART CATH AND CORONARY ANGIOGRAPHY;  Surgeon: Ladona Heinz, MD;  Location: [facility];  Service: Cardiovascular;  Laterality: N/A;   VENTRICULOSTOMY  03/28/1997    Allergies:  Allergies  Allergen Reactions   Icosapent  Ethyl (Epa Ethyl Ester) (Fish)     Hives w/Vascepa    Ketamine Other (See Comments)    In vegetative state for 15 minutes per pt   [place] Itching    Itchy throat  Itchy throat Itchy throat Itchy throat Itchy throat    Itchy  throat  Itchy throat Itchy throat Itchy throat Itchy throat    Itchy throat   Morphine  Itching, Rash and Hives    Sorethroat   Penicillins Hives, Itching and Rash    Has patient had a PCN reaction causing immediate rash, facial/tongue/throat swelling, SOB or lightheadedness with hypotension: Y Has patient had a PCN reaction causing severe rash involving mucus membranes or skin necrosis: Y Has patient had a PCN reaction that required hospitalization: N Has patient had a PCN reaction occurring within the last 10 years: Y   Clarithromycin Other (See Comments)    Stomach pain   Erythromycin      Stomach pain   Fentanyl  Nausea And Vomiting   Penicillin V Hives, Itching and Other (See Comments)    Gi upset, also   Shellfish Allergy Other (See Comments)    Pt has never had shellfish but tested positive on allergy test. Pt states contrast in CT is okay   Prednisone Rash    Medications:   Prior to Admission medications   Medication Sig Start Date End Date Taking? Authorizing Provider  amitriptyline  (ELAVIL ) 10 MG tablet Take 10 mg by mouth at bedtime.    [provider]  aspirin  EC 81 MG EC tablet Take 1 tablet (81 mg total) by mouth daily. Swallow whole. 12/07/20   Fausto Burnard LABOR, DO  azelastine  (ASTELIN ) 0.1 % nasal spray Place 2 sprays into both nostrils daily at 6 (six) AM.    [provider]  BLISOVI  FE 1.5/30 1.5-30 MG-MCG tablet Take 1 tablet by mouth daily.    [provider]   butalbital -acetaminophen -caffeine  (FIORICET ) 50-325-40 MG tablet Take 1 tablet by mouth daily as needed for headache. 09/26/22   [provider]  carvedilol  (COREG ) 3.125 MG tablet Take 1 tablet (3.125 mg total) by mouth 2 (two) times daily. 10/04/22   [name], Daniel R, MD  CRANBERRY CONCENTRATE PO Take 2 tablets by mouth 2 (two) times daily.    [provider]  CREON  36000-114000 units CPEP capsule Take 72,000 Units by mouth 3 (three) times daily.    [provider]  DULoxetine  (CYMBALTA ) 60 MG capsule Take 60 mg by mouth at bedtime. 09/26/22   [provider]  ergocalciferol  (VITAMIN D2) 1.25 MG (50000 UT) capsule Take 50,000 Units by mouth every 7 (seven) days. Every Sunday    [provider]  Evolocumab  (REPATHA ) 140 MG/ML SOSY Inject 140 mg into the skin every Saturday. Inject 140 mg subcutaneously every other Wednesday evening    [provider]  fenofibrate  54 MG tablet Take 1 tablet (54 mg total) by mouth daily. 12/01/22   [name], Vinie BROCKS, MD  hydrALAZINE  (APRESOLINE ) 100 MG tablet Take 1 tablet (100 mg total) by mouth 3 (three) times daily. 04/12/23   Singh, Prashant K, MD  insulin  lispro (HUMALOG ) 100 UNIT/ML injection Inject 30-45 Units into the skin 3 (three) times daily before meals. Inject subcutaneously 45 units with breakfast, 30 units with lunch and 30 units with dinner    [provider]  Insulin  Pen Needle 32G X 4 MM MISC 1 Device by Does not apply route QID. For use with insulin  pens 02/13/22   Cindy Garnette POUR, MD  insulin  regular human CONCENTRATED (HUMULIN  R U-500 KWIKPEN) 500 UNIT/ML KwikPen Inject 185 Units into the skin 3 (three) times daily with meals. Patient taking differently: Inject 185-195 Units into the skin 3 (three) times daily with meals. Inject subcutaneously 195 units with breakfast and lunch and 185 units  with dinner 09/20/22 01/09/24  Donnamarie Lebron PARAS, MD  ivabradine  (CORLANOR ) 7.5 MG TABS tablet Take 1  tablet (7.5 mg total) by mouth 2 (two) times daily with a meal. 01/31/23   [place]ford, Harlene HERO, FNP  levocetirizine (XYZAL ) 5 MG tablet Take 5 mg by mouth daily. 03/05/22   [provider]  levothyroxine  (SYNTHROID ) 150 MCG tablet Take 1 tablet (150 mcg total) by mouth daily before breakfast. 04/12/23   Singh, Prashant K, MD  pantoprazole  (PROTONIX ) 40 MG tablet Take 40 mg by mouth daily. 12/24/19   [provider]  pregabalin  (LYRICA ) 300 MG capsule Take 300 mg by mouth 2 (two) times daily.    [provider]  prochlorperazine  (COMPAZINE ) 5 MG tablet Take 5 mg by mouth at bedtime.    [provider]  rosuvastatin  (CRESTOR ) 40 MG tablet Take 40 mg by mouth daily. 08/09/21   [provider]  sodium bicarbonate  650 MG tablet Take 1,300 mg by mouth 2 (two) times daily.    [provider]  SUMAtriptan  (IMITREX ) 25 MG tablet Take 25 mg by mouth every 2 (two) hours as needed. 01/30/23   [provider]  topiramate  (TOPAMAX ) 50 MG tablet Take 50 mg by mouth 2 (two) times daily.    [provider]  torsemide  (DEMADEX ) 20 MG tablet Take 2 tablets (40 mg total) by mouth daily. May take 1 additional tablet as needed 10/05/22   [name], Toribio SAUNDERS, MD  tretinoin  (RETIN-A ) 0.05 % cream Apply 1 application topically at bedtime. 01/17/21   [provider]    Inpatient medications:  albuterol   10 mg Nebulization Once   furosemide   40 mg Intravenous Once   insulin  aspart  10 Units Subcutaneous Once   sodium zirconium cyclosilicate   10 g Oral TID    Discontinued Meds:  There are no discontinued medications.  Social History:  reports that she has never smoked. She has never used smokeless tobacco. She reports that she does not drink alcohol  and does not use drugs.  Family History:   Family History  Problem Relation Age of Onset   Diabetes Mother    Hypertension Mother    Hyperlipidemia Mother    Thyroid  disease Mother     Hypertension Father    Diabetes Father    Pancreatic cancer Paternal Aunt    Pancreatic cancer Paternal Uncle    Colon cancer Neg Hx    Esophageal cancer Neg Hx    Stomach cancer Neg Hx     Pertinent items are noted in HPI. Weight change:  No intake or output data in the 24 hours ending 05/04/23 1314 BP (!) 199/120 (BP Location: Right Arm)   Pulse 99   Temp 98.6 F (37 C) (Oral)   Resp 18   LMP 03/22/2023   SpO2 100%  Vitals:   05/04/23 1145 05/04/23 1245  BP: (!) 199/120   Pulse: (!) 120 99  Resp: 18   Temp: 98.6 F (37 C)   TempSrc: Oral   SpO2: 100%      General appearance: alert, cooperative, and no distress Resp: clear to auscultation bilaterally Chest wall: no tenderness Cardio: Tachycardic but no abnormal heart sounds appreciated Pulses: 2+ and symmetric Skin: Skin color, texture, turgor normal. No rashes or lesions  Labs: Basic Metabolic Panel: Recent Labs  Lab 04/28/23 1528 05/04/23 1145 05/04/23 1302  NA 138 129* 131*  K 4.3 7.1* 7.7*  CL 104 97* 110  CO2 23 19*  --  GLUCOSE 69* 335* 355*  BUN 52* 36* 50*  CREATININE 2.23* 1.83* 2.10*  ALBUMIN  4.7 4.6  --   CALCIUM  10.1 9.3  --    Liver Function Tests: Recent Labs  Lab 04/28/23 1528 05/04/23 1145  AST 77* 88*  ALT 84* 98*  ALKPHOS 77 87  BILITOT 0.5 1.5*  PROT 8.6* PENDING  ALBUMIN  4.7 4.6   Recent Labs  Lab 04/28/23 1528 05/04/23 1145  LIPASE 32 29   No results for input(s): AMMONIA in the last 168 hours. CBC: Recent Labs  Lab 04/28/23 1528 05/04/23 1145 05/04/23 1302  WBC 7.3 8.6  --   HGB 13.2 14.6 14.6  HCT 40.3 42.7 43.0  MCV 93.3 92.2  --   PLT 213 180  --    PT/INR: @LABRCNTIP (inr:5) Cardiac Enzymes: )No results for input(s): CKTOTAL, CKMB, CKMBINDEX, TROPONINI in the last 168 hours. CBG: Recent Labs  Lab 04/28/23 2119 05/04/23 1143  GLUCAP 49* 365*    Iron Studies: No results for input(s): IRON, TIBC, TRANSFERRIN, FERRITIN in the last  168 hours.  Xrays/Other Studies: No results found.   Assessment/Plan: Hyperkalemia - This is a CKD patient with type 1 diabetes on insulin  who was found to have a potassium of 7.7 with a Cr of 1.83 on admission . Cr was 2.23 six days ago, low concerns for acute on chronic kidney injury induced hyperkalemia at this time. Not on any medications that could be causing this as well.I suspect this is likely due to hyperglycemia in the setting of type 1 diabetes with a blood glucose of 355 vs renal tubular acidosis type IV.  EKG reviewed, patient denies any chest pain or any urinary symptoms at this time. I don't think there is an indication for emergent dialysis at this time .Will give Lokelma  10 mg 3 times daily and repeat CMP to trend potassium at 4 PM. If hyperkalemia is not resolved or gotten better during this time, will consider repeating EKG and a potential dialysis if it gets worse. Will also get aldosterone levels to assess for RTA IV. Avoid nephrotoxic agents and follow up on UA. Nephology will continue to follow  Hypertension - Blood pressure of 171/100, reports she has not taken her afternoon dose of her antihypertensives prior to this admission.  Resume home blood pressure medications when able and monitor vital signs. Lactic acidosis / NAGMA  - Likely due to hyperglycemia.  Trend lactic acidosis for glycemic control. Hyperglycemia - Per primary  CHF - No clinical signs of heart failure exacerbation, got 1 dose of Lasix  40 mg in the ED.  Resume heart failure medications when appropriate per primary.   Drue Grow 05/04/2023, 1:14 PM

## 2023-05-04 NOTE — Assessment & Plan Note (Signed)
 Continue with Synthroid 

## 2023-05-04 NOTE — Assessment & Plan Note (Signed)
 Was initially sent to the ER due to uncontrolled hypertension.  At this time I have resumed patient's Coreg  as well as standing hydralazine .  It is possible patient was having uncontrolled hypertension due to her abdominal pain.  Therefore at this time as needed labetalol  and hydralazine  are being used.  It is noted that the patient was initially prescribed on 100 mg 3 ID of hydralazine .  But has been taking only 50 3 times daily.  I am continuing with 50 3 times daily.  In case of persistent hypertension we will increase to 100 3 times daily.

## 2023-05-04 NOTE — Assessment & Plan Note (Signed)
 Chronic ptosis.  This is believed to be in remission

## 2023-05-04 NOTE — Assessment & Plan Note (Addendum)
 Patient glucose noted to be 365.  Patient advises of requiring up to 100 units of insulin  at a time.  With very poor control of hyperglycemia at home.  However patient ascribes this to not taking diabetic diet at home.  Therefore at this time we will start the patient on 30 units 3 times a day.  I will order small dose of basal insulin  and sliding scale.  Due to such marked resistance to insulin , I will check insulin  antibodies.

## 2023-05-04 NOTE — ED Provider Triage Note (Signed)
 Emergency Medicine Provider Triage Evaluation Note  Mahima Hottle , a 33 y.o. female  was evaluated in triage.  Pt complains of hypertension and hyperglycemia.  Patient was at her pulmonologist office and her blood pressures noted to be elevated.  Patient hydralazine .  Patient with multiple medical comorbidities including chronic kidney disease..  Review of Systems    Physical Exam  BP (!) 199/120 (BP Location: Right Arm)   Pulse (!) 120   Temp 98.6 F (37 C) (Oral)   Resp 18   LMP 03/22/2023   SpO2 100%  Gen:   Awake, no distress   Resp:  Normal effort  MSK:   Moves extremities without difficulty  Other:    Medical Decision Making  Medically screening exam initiated at 12:04 PM.  Appropriate orders placed.  Aysha Livecchi was informed that the remainder of the evaluation will be completed by another provider, this initial triage assessment does not replace that evaluation, and the importance of remaining in the ED until their evaluation is complete.  Ordered the patient some IV labetalol .  Patient hyperglycemic, does not appear acidotic.  Labs ordered.   Mannie Pac T, DO 05/04/23 1205

## 2023-05-04 NOTE — Assessment & Plan Note (Signed)
 This is previously documented in the chart.  At this time patient reporting new lower abdominal pain.  With associated nausea.  There is no leukocytosis or diarrhea or constipation.  CT abdomen pelvis not actionable.  Urinalysis is still pending.  My working consideration is that patient may be having a urinary tract infection.  Although she does not report dysuria per se.  I will add on a urine culture, treat this with ceftriaxone  and pain medication.

## 2023-05-04 NOTE — Assessment & Plan Note (Signed)
 Chronically documented.  Continue with aspirin , Coreg , evolocumab  ivabradine  and Crestor 

## 2023-05-04 NOTE — Assessment & Plan Note (Signed)
 Continue with patient birth control pill.

## 2023-05-04 NOTE — H&P (Signed)
 History and Physical    Patient: Jennifer [name] FMW:[id number] DOB: 1990/04/20 DOA: 05/04/2023 DOS: the patient was seen and examined on 05/04/2023 PCP: Emilio Joesph DEL, PA-C  Patient coming from:  [facility] office.  Chief Complaint:  Chief Complaint  Patient presents with   Hypertension   Abdominal Pain   HPI: Jennifer [name] is a 33 y.o. female with medical history significant of Hyperchylomicronemia, HFrEF, pancreatitis, astrocytoma, insulin -dependent type 2 diabetes with gastroparesis and peripheral neuropathy, hypertension, CKD 3B, pancreatic insufficiency patient's last hospitalization was in April 12, 2023 when she was discharged from [facility] health for life-threatening hyperkalemia.  Patient was supposed to see nephrology after discharge however it looks like that encounter never happened.  Regardless patient was actually at the pulmonary office today for workup for obstructive sleep apnea.  Patient was noted to be hypertensive.  And referred to the [facility], ER.  Here the patient's husband pressure recorded is 199 120.  Patient has received labetalol  blood pressure coming under control.  Also noted to be tachycardic 109.  Potassium in the ER was 7.7.  Patient did receive insulin  glucose and Lokelma  and calcium  gluconate IV.  Repeat potassium is 4.8.  Medical evaluation is sought  Patient reports to me having new lower abdominal anterior pain since earlier this morning which she reports is severe.  Pretty constant without any diarrhea, or vomiting but there is some nausea present no fever no prior similar episode.  Patient has no burning micturition and patient is not having her periods.  She denies constipation as well.  No vaginal discharge medical evaluation is sought.   Review of Systems: As mentioned in the history of present illness. All other systems reviewed and are negative. Past Medical History:  Diagnosis Date   Afib (HCC) 05/12/2021   Brain tumor (HCC) 03/29/1995    astrocytoma   CHF (congestive heart failure) (HCC)    Cholesterosis    CKD (chronic kidney disease) stage 4, GFR 15-29 ml/min (HCC) 05/13/2021   DM (diabetes mellitus) (HCC) 10/10/2018   Fatty liver    HTN (hypertension) 10/10/2018   Hypothyroidism 10/10/2018   Lipoprotein deficiency    Lung disease    longevity long term   Pancreatitis    Polycystic ovary syndrome    Past Surgical History:  Procedure Laterality Date   ABDOMINAL SURGERY     pt states during miscarriage got her intestine   BRAIN SURGERY     EYE MUSCLE SURGERY Right 03/28/2014   PRESSURE SENSOR/CARDIOMEMS N/A 12/06/2021   Procedure: PRESSURE SENSOR/CARDIOMEMS;  Surgeon: [name] Toribio SAUNDERS, MD;  Location: [facility];  Service: Cardiovascular;  Laterality: N/A;   RIGHT HEART CATH N/A 08/05/2021   Procedure: RIGHT HEART CATH;  Surgeon: [name] Toribio SAUNDERS, MD;  Location: [facility];  Service: Cardiovascular;  Laterality: N/A;   RIGHT HEART CATH N/A 12/06/2021   Procedure: RIGHT HEART CATH;  Surgeon: [name] Toribio SAUNDERS, MD;  Location: [facility];  Service: Cardiovascular;  Laterality: N/A;   RIGHT/LEFT HEART CATH AND CORONARY ANGIOGRAPHY N/A 12/03/2020   Procedure: RIGHT/LEFT HEART CATH AND CORONARY ANGIOGRAPHY;  Surgeon: Ladona Heinz, MD;  Location: [facility];  Service: Cardiovascular;  Laterality: N/A;   VENTRICULOSTOMY  03/28/1997   Social History:  reports that she has never smoked. She has never used smokeless tobacco. She reports that she does not drink alcohol  and does not use drugs.  Allergies  Allergen Reactions   Icosapent  Ethyl (Epa Ethyl Ester) (Fish)  Hives w/Vascepa    Ketamine Other (See Comments)    In vegetative state for 15 minutes per pt   [place] Itching    Itchy throat  Itchy throat Itchy throat Itchy throat Itchy throat    Itchy throat  Itchy throat Itchy throat Itchy throat Itchy throat    Itchy throat   Morphine  Itching, Rash and Hives    Sorethroat    Penicillins Hives, Itching and Rash    Has patient had a PCN reaction causing immediate rash, facial/tongue/throat swelling, SOB or lightheadedness with hypotension: Y Has patient had a PCN reaction causing severe rash involving mucus membranes or skin necrosis: Y Has patient had a PCN reaction that required hospitalization: N Has patient had a PCN reaction occurring within the last 10 years: Y   Clarithromycin Other (See Comments)    Stomach pain   Erythromycin      Stomach pain   Fentanyl  Nausea And Vomiting   Penicillin V Hives, Itching and Other (See Comments)    Gi upset, also   Shellfish Allergy Other (See Comments)    Pt has never had shellfish but tested positive on allergy test. Pt states contrast in CT is okay   Prednisone Rash    Family History  Problem Relation Age of Onset   Diabetes Mother    Hypertension Mother    Hyperlipidemia Mother    Thyroid  disease Mother    Hypertension Father    Diabetes Father    Pancreatic cancer Paternal Aunt    Pancreatic cancer Paternal Uncle    Colon cancer Neg Hx    Esophageal cancer Neg Hx    Stomach cancer Neg Hx     Prior to Admission medications   Medication Sig Start Date End Date Taking? Authorizing Provider  amitriptyline  (ELAVIL ) 10 MG tablet Take 10 mg by mouth at bedtime.   Yes [provider]  aspirin  EC 81 MG EC tablet Take 1 tablet (81 mg total) by mouth daily. Swallow whole. 12/07/20  Yes Fausto Sor A, DO  azelastine  (ASTELIN ) 0.1 % nasal spray Place 2 sprays into both nostrils daily at 6 (six) AM.   Yes [provider]  BLISOVI  FE 1.5/30 1.5-30 MG-MCG tablet Take 1 tablet by mouth daily.   Yes [provider]  butalbital -acetaminophen -caffeine  (FIORICET ) 50-325-40 MG tablet Take 1 tablet by mouth daily as needed for headache. 09/26/22  Yes [provider]  carvedilol  (COREG ) 3.125 MG tablet Take 1 tablet (3.125 mg total) by mouth 2 (two) times daily. 10/04/22  Yes [name], Daniel  R, MD  CRANBERRY CONCENTRATE PO Take 2 tablets by mouth 2 (two) times daily.   Yes [provider]  CREON  36000-114000 units CPEP capsule Take 36,000 Units by mouth 3 (three) times daily.   Yes [provider]  DULoxetine  (CYMBALTA ) 60 MG capsule Take 60 mg by mouth at bedtime. 09/26/22  Yes [provider]  ergocalciferol  (VITAMIN D2) 1.25 MG (50000 UT) capsule Take 50,000 Units by mouth every 7 (seven) days. Every Sunday   Yes [provider]  Evolocumab  (REPATHA ) 140 MG/ML SOSY Inject 140 mg into the skin every Saturday. Inject 140 mg subcutaneously every other Wednesday evening   Yes [provider]  fenofibrate  54 MG tablet Take 1 tablet (54 mg total) by mouth daily. Patient taking differently: Take 54 mg by mouth at bedtime. 12/01/22  Yes [name], Vinie BROCKS, MD  hydrALAZINE  (APRESOLINE ) 100 MG tablet Take 1 tablet (100 mg total) by mouth 3 (three) times daily.  Patient taking differently: Take 50 mg by mouth 3 (three) times daily. 04/12/23  Yes Singh, Prashant K, MD  insulin  lispro (HUMALOG ) 100 UNIT/ML injection Inject 30-40 Units into the skin See admin instructions. Inject subcutaneously 40 units with breakfast, 30 units with lunch and 30 units with dinner   Yes [provider]  insulin  regular human CONCENTRATED (HUMULIN  R U-500 KWIKPEN) 500 UNIT/ML KwikPen Inject 185 Units into the skin 3 (three) times daily with meals. Patient taking differently: Inject 195 Units into the skin 2 (two) times daily with a meal. Inject subcutaneously 195 units with breakfast and lunch 09/20/22 01/09/24 Yes Ezenduka, Nkeiruka J, MD  ivabradine  (CORLANOR ) 7.5 MG TABS tablet Take 1 tablet (7.5 mg total) by mouth 2 (two) times daily with a meal. Patient taking differently: Take 7.5 mg by mouth at bedtime. 01/31/23  Yes Milford, Harlene HERO, FNP  levocetirizine (XYZAL ) 5 MG tablet Take 5 mg by mouth daily. 03/05/22  Yes [provider]  levothyroxine  (SYNTHROID )  137 MCG tablet Take 137 mcg by mouth daily. 04/12/23  Yes [provider]  pantoprazole  (PROTONIX ) 40 MG tablet Take 40 mg by mouth daily. 12/24/19  Yes [provider]  pregabalin  (LYRICA ) 300 MG capsule Take 300 mg by mouth 2 (two) times daily.   Yes [provider]  prochlorperazine  (COMPAZINE ) 5 MG tablet Take 5 mg by mouth at bedtime.   Yes [provider]  rosuvastatin  (CRESTOR ) 40 MG tablet Take 40 mg by mouth daily. 08/09/21  Yes [provider]  sodium bicarbonate  650 MG tablet Take 1,300 mg by mouth 2 (two) times daily.   Yes [provider]  SUMAtriptan  (IMITREX ) 25 MG tablet Take 25 mg by mouth every 2 (two) hours as needed. 01/30/23  Yes [provider]  topiramate  (TOPAMAX ) 50 MG tablet Take 50 mg by mouth 2 (two) times daily.   Yes [provider]  torsemide  (DEMADEX ) 20 MG tablet Take 2 tablets (40 mg total) by mouth daily. May take 1 additional tablet as needed 10/05/22  Yes [name], Toribio SAUNDERS, MD  tretinoin  (RETIN-A ) 0.05 % cream Apply 1 application topically at bedtime. 01/17/21  Yes [provider]  Insulin  Pen Needle 32G X 4 MM MISC 1 Device by Does not apply route QID. For use with insulin  pens 02/13/22   Cindy Garnette POUR, MD    Physical Exam: Vitals:   05/04/23 1645 05/04/23 1754 05/04/23 1830 05/04/23 2010  BP: (!) 153/83  (!) 168/95 (!) 155/84  Pulse: (!) 110  (!) 108 (!) 109  Resp: 20  20 12   Temp:  98.2 F (36.8 C)    TempSrc:  Oral    SpO2: 96%  96% 98%   General: Patient is alert and awake right eye ptosis that is chronic per patient due to her brain tumor resection.  Patient appears to be in no distress otherwise Respiratory exam: Bilateral intravesicular Cardiovascular exam S1-S2 normal Abdomen all quadrants are soft, some left lower quadrant tenderness reported without any guarding or rebound. Extremities warm without edema. Data Reviewed:  Labs on Admission:  Results for orders  placed or performed during the hospital encounter of 05/04/23 (from the past 24 hours)  CBG monitoring, ED     Status: Abnormal   Collection Time: 05/04/23 11:43 AM  Result Value Ref Range   Glucose-Capillary 365 (H) 70 - 99 mg/dL  Lipase, blood     Status: None   Collection Time: 05/04/23 11:45 AM  Result Value Ref  Range   Lipase 29 11 - 51 U/L  Comprehensive metabolic panel     Status: Abnormal   Collection Time: 05/04/23 11:45 AM  Result Value Ref Range   Sodium 129 (L) 135 - 145 mmol/L   Potassium 7.1 (HH) 3.5 - 5.1 mmol/L   Chloride 97 (L) 98 - 111 mmol/L   CO2 19 (L) 22 - 32 mmol/L   Glucose, Bld 335 (H) 70 - 99 mg/dL   BUN 36 (H) 6 - 20 mg/dL   Creatinine, Ser 8.16 (H) 0.44 - 1.00 mg/dL   Calcium  9.3 8.9 - 10.3 mg/dL   Total Protein 9.5 (H) 6.5 - 8.1 g/dL   Albumin  4.6 3.5 - 5.0 g/dL   AST 88 (H) 15 - 41 U/L   ALT 98 (H) 0 - 44 U/L   Alkaline Phosphatase 87 38 - 126 U/L   Total Bilirubin 1.5 (H) 0.0 - 1.2 mg/dL   GFR, Estimated 37 (L) >60 mL/min   Anion gap 13 5 - 15  CBC     Status: None   Collection Time: 05/04/23 11:45 AM  Result Value Ref Range   WBC 8.6 4.0 - 10.5 K/uL   RBC 4.63 3.87 - 5.11 MIL/uL   Hemoglobin 14.6 12.0 - 15.0 g/dL   HCT 57.2 63.9 - 53.9 %   MCV 92.2 80.0 - 100.0 fL   MCH 31.5 26.0 - 34.0 pg   MCHC 34.2 30.0 - 36.0 g/dL   RDW 84.7 88.4 - 84.4 %   Platelets 180 150 - 400 K/uL   nRBC 0.0 0.0 - 0.2 %  Beta-hydroxybutyric acid     Status: None   Collection Time: 05/04/23 11:49 AM  Result Value Ref Range   Beta-Hydroxybutyric Acid 0.13 0.05 - 0.27 mmol/L  hCG, serum, qualitative     Status: None   Collection Time: 05/04/23 12:04 PM  Result Value Ref Range   Preg, Serum NEGATIVE NEGATIVE  I-Stat CG4 Lactic Acid, ED     Status: Abnormal   Collection Time: 05/04/23 12:16 PM  Result Value Ref Range   Lactic Acid, Venous 2.4 (HH) 0.5 - 1.9 mmol/L   Comment NOTIFIED PHYSICIAN   I-stat chem 8, ED (not at [facility], [facility] or [facility])     Status: Abnormal    Collection Time: 05/04/23  1:02 PM  Result Value Ref Range   Sodium 131 (L) 135 - 145 mmol/L   Potassium 7.7 (HH) 3.5 - 5.1 mmol/L   Chloride 110 98 - 111 mmol/L   BUN 50 (H) 6 - 20 mg/dL   Creatinine, Ser 7.89 (H) 0.44 - 1.00 mg/dL   Glucose, Bld 644 (H) 70 - 99 mg/dL   Calcium , Ion 1.12 (L) 1.15 - 1.40 mmol/L   TCO2 20 (L) 22 - 32 mmol/L   Hemoglobin 14.6 12.0 - 15.0 g/dL   HCT 56.9 63.9 - 53.9 %   Comment NOTIFIED PHYSICIAN   I-stat chem 8, ED (not at [facility], [facility] or [facility])     Status: Abnormal   Collection Time: 05/04/23  2:59 PM  Result Value Ref Range   Sodium 133 (L) 135 - 145 mmol/L   Potassium 5.6 (H) 3.5 - 5.1 mmol/L   Chloride 110 98 - 111 mmol/L   BUN 37 (H) 6 - 20 mg/dL   Creatinine, Ser 7.89 (H) 0.44 - 1.00 mg/dL   Glucose, Bld 690 (H) 70 - 99 mg/dL   Calcium , Ion 1.13 (L) 1.15 - 1.40 mmol/L   TCO2 19 (  L) 22 - 32 mmol/L   Hemoglobin 12.9 12.0 - 15.0 g/dL   HCT 61.9 63.9 - 53.9 %  Comprehensive metabolic panel     Status: Abnormal   Collection Time: 05/04/23  4:40 PM  Result Value Ref Range   Sodium 134 (L) 135 - 145 mmol/L   Potassium 4.8 3.5 - 5.1 mmol/L   Chloride 103 98 - 111 mmol/L   CO2 19 (L) 22 - 32 mmol/L   Glucose, Bld 259 (H) 70 - 99 mg/dL   BUN 37 (H) 6 - 20 mg/dL   Creatinine, Ser 8.07 (H) 0.44 - 1.00 mg/dL   Calcium  9.3 8.9 - 10.3 mg/dL   Total Protein 7.3 6.5 - 8.1 g/dL   Albumin  3.9 3.5 - 5.0 g/dL   AST 55 (H) 15 - 41 U/L   ALT 81 (H) 0 - 44 U/L   Alkaline Phosphatase 71 38 - 126 U/L   Total Bilirubin 0.7 0.0 - 1.2 mg/dL   GFR, Estimated 35 (L) >60 mL/min   Anion gap 12 5 - 15   Basic Metabolic Panel: Recent Labs  Lab 04/28/23 1528 05/04/23 1145 05/04/23 1302 05/04/23 1459 05/04/23 1640  NA 138 129* 131* 133* 134*  K 4.3 7.1* 7.7* 5.6* 4.8  CL 104 97* 110 110 103  CO2 23 19*  --   --  19*  GLUCOSE 69* 335* 355* 309* 259*  BUN 52* 36* 50* 37* 37*  CREATININE 2.23* 1.83* 2.10* 2.10* 1.92*  CALCIUM  10.1 9.3  --   --  9.3   Liver  Function Tests: Recent Labs  Lab 04/28/23 1528 05/04/23 1145 05/04/23 1640  AST 77* 88* 55*  ALT 84* 98* 81*  ALKPHOS 77 87 71  BILITOT 0.5 1.5* 0.7  PROT 8.6* 9.5* 7.3  ALBUMIN  4.7 4.6 3.9   Recent Labs  Lab 04/28/23 1528 05/04/23 1145  LIPASE 32 29   No results for input(s): AMMONIA in the last 168 hours. CBC: Recent Labs  Lab 04/28/23 1528 05/04/23 1145 05/04/23 1302 05/04/23 1459  WBC 7.3 8.6  --   --   HGB 13.2 14.6 14.6 12.9  HCT 40.3 42.7 43.0 38.0  MCV 93.3 92.2  --   --   PLT 213 180  --   --    Cardiac Enzymes: No results for input(s): CKTOTAL, CKMB, CKMBINDEX, TROPONINIHS in the last 168 hours.  BNP (last 3 results) No results for input(s): PROBNP in the last 8760 hours. CBG: Recent Labs  Lab 04/28/23 2119 05/04/23 1143  GLUCAP 49* 365*    Radiological Exams on Admission:  CT ABDOMEN PELVIS WO CONTRAST Result Date: 05/04/2023 CLINICAL DATA:  Abdominal pain. EXAM: CT ABDOMEN AND PELVIS WITHOUT CONTRAST TECHNIQUE: Multidetector CT imaging of the abdomen and pelvis was performed following the standard protocol without IV contrast. RADIATION DOSE REDUCTION: This exam was performed according to the departmental dose-optimization program which includes automated exposure control, adjustment of the mA and/or kV according to patient size and/or use of iterative reconstruction technique. COMPARISON:  CT abdomen pelvis dated January 13, 2023. FINDINGS: Lower chest: No acute abnormality. Hepatobiliary: Liver remains enlarged with mildly decreased density but no focal abnormality. Normal gallbladder. No biliary dilatation. Pancreas: Unremarkable. No pancreatic ductal dilatation or surrounding inflammatory changes. Spleen: Borderline enlarged, unchanged.  No focal abnormality. Adrenals/Urinary Tract: Adrenal glands are unremarkable. Chronic renal cortical thinning again noted. No renal calculi, focal lesion, or hydronephrosis. Bladder is unremarkable.  Stomach/Bowel: Stomach is within normal limits. High density  material within the appendix, which is otherwise unremarkable. No evidence of bowel wall thickening, distention, or inflammatory changes. Vascular/Lymphatic: Aortic atherosclerosis. No enlarged abdominal or pelvic lymph nodes. Reproductive: Uterus and bilateral adnexa are unremarkable. Other: No free fluid or pneumoperitoneum. Musculoskeletal: No acute or significant osseous findings. IMPRESSION: 1. No acute intra-abdominal process. 2. Unchanged hepatomegaly with steatosis. 3.  Aortic Atherosclerosis (ICD10-I70.0). Electronically Signed   By: Elsie ONEIDA Shoulder M.D.   On: 05/04/2023 16:42     EKG: Independently reviewed. no Peaked T waves were present  No intake/output data recorded. No intake/output data recorded.      Assessment and Plan: * Hyperkalemia Stable reason for observation this evening.  Seems to have improved with Lokelma , calcium  gluconate and insulin  with dextrose .   renal consult in chart. Patient curenlty ordered for lokelma  TID. I will reduce frequency to daily.  Intractable abdominal pain This is previously documented in the chart.  At this time patient reporting new lower abdominal pain.  With associated nausea.  There is no leukocytosis or diarrhea or constipation.  CT abdomen pelvis not actionable.  Urinalysis is still pending.  My working consideration is that patient may be having a urinary tract infection.  Although she does not report dysuria per se.  I will add on a urine culture, treat this with ceftriaxone  and pain medication.  History of astrocytoma of brain Chronic ptosis.  This is believed to be in remission  Pancreatitis C/w creon   Amenorrhea Continue with patient birth control pill.  Acute combined systolic and diastolic heart failure (HCC) Chronically documented.  Continue with aspirin , Coreg , evolocumab  ivabradine  and Crestor   HTN (hypertension) Was initially sent to the ER due to  uncontrolled hypertension.  At this time I have resumed patient's Coreg  as well as standing hydralazine .  It is possible patient was having uncontrolled hypertension due to her abdominal pain.  Therefore at this time as needed labetalol  and hydralazine  are being used.  It is noted that the patient was initially prescribed on 100 mg 3 ID of hydralazine .  But has been taking only 50 3 times daily.  I am continuing with 50 3 times daily.  In case of persistent hypertension we will increase to 100 3 times daily.  Hypothyroidism Continue with Synthroid   Type 2 diabetes mellitus with hyperlipidemia (HCC) Patient glucose noted to be 365.  Patient advises of requiring up to 100 units of insulin  at a time.  With very poor control of hyperglycemia at home.  However patient ascribes this to not taking diabetic diet at home.  Therefore at this time we will start the patient on 30 units 3 times a day.  I will order small dose of basal insulin  and sliding scale.  Due to such marked resistance to insulin , I will check insulin  antibodies.      Advance Care Planning:   Code Status: Prior full code.  Consults: renal  Family Communication: per pateint.  Severity of Illness: The appropriate patient status for this patient is OBSERVATION. Observation status is judged to be reasonable and necessary in order to provide the required intensity of service to ensure the patient's safety. The patient's presenting symptoms, physical exam findings, and initial radiographic and laboratory data in the context of their medical condition is felt to place them at decreased risk for further clinical deterioration. Furthermore, it is anticipated that the patient will be medically stable for discharge from the hospital within 2 midnights of admission.   Author: Jacqulyn Divine, MD 05/04/2023 8:12 PM  For on call review [email or web address].

## 2023-05-04 NOTE — ED Provider Notes (Signed)
 Medical Decision Making: Care of patient assumed from Dr. Mannie at 5:41 PM.  Agree with history.  See their note for further details.  Briefly, 33 y.o. female with PMH/PSH as below.  Past Medical History:  Diagnosis Date   Afib (HCC) 05/12/2021   Brain tumor (HCC) 03/29/1995   astrocytoma   CHF (congestive heart failure) (HCC)    Cholesterosis    CKD (chronic kidney disease) stage 4, GFR 15-29 ml/min (HCC) 05/13/2021   DM (diabetes mellitus) (HCC) 10/10/2018   Fatty liver    HTN (hypertension) 10/10/2018   Hypothyroidism 10/10/2018   Lipoprotein deficiency    Lung disease    longevity long term   Pancreatitis    Polycystic ovary syndrome    Past Surgical History:  Procedure Laterality Date   ABDOMINAL SURGERY     pt states during miscarriage got her intestine   BRAIN SURGERY     EYE MUSCLE SURGERY Right 03/28/2014   PRESSURE SENSOR/CARDIOMEMS N/A 12/06/2021   Procedure: PRESSURE SENSOR/CARDIOMEMS;  Surgeon: [name] Toribio SAUNDERS, MD;  Location: [facility];  Service: Cardiovascular;  Laterality: N/A;   RIGHT HEART CATH N/A 08/05/2021   Procedure: RIGHT HEART CATH;  Surgeon: [name] Toribio SAUNDERS, MD;  Location: [facility];  Service: Cardiovascular;  Laterality: N/A;   RIGHT HEART CATH N/A 12/06/2021   Procedure: RIGHT HEART CATH;  Surgeon: [name] Toribio SAUNDERS, MD;  Location: [facility];  Service: Cardiovascular;  Laterality: N/A;   RIGHT/LEFT HEART CATH AND CORONARY ANGIOGRAPHY N/A 12/03/2020   Procedure: RIGHT/LEFT HEART CATH AND CORONARY ANGIOGRAPHY;  Surgeon: Ladona Heinz, MD;  Location: [facility];  Service: Cardiovascular;  Laterality: N/A;   VENTRICULOSTOMY  03/28/1997      Patient presents with hypertension noted at her pulmonologist office for which she received labetalol  on arrival.  Subsequently found to have potassium of 7.1 and EKG demonstrating peaked T waves, subsequently received calcium  gluconate as well as potassium shifting medications.   Repeat potassium 5.6.  Case was discussed with nephrology by previous provider.  CT abdomen/pelvis ordered due to some abdominal tenderness on exam and was pending at time of my assumption of care.  Current plan is as follows:  Clinical Course as of 05/04/23 2336  Thu May 04, 2023  1620 Hx CP, astrocytoma, CKD  At pulm work up for OSA, found to be hyperK Nephro aware [ ]  admit after CT [GG]  1928 Dr. Moody with hospitalist [GG]    Clinical Course User Index [GG] Larna Raring, MD    MDM:  -Reviewed and confirmed nursing documentation for past medical history, family history, social history. -Vital signs stable. -CT without acute findings -Subsequently discussed case with Dr. Moody with the hospitalist team who will admit patient for further evaluation and management -Patient had no acute events while under my care in the emergency department.     The plan for this patient was discussed with Dr. Charlyn, who voiced agreement and who oversaw evaluation and treatment of this patient.  Raring Larna, MD Emergency Medicine, PGY-2  Note: Dragon medical dictation software was used in the creation of this note.   ED dispo: Linard Larna Raring, MD 05/04/23 7660    Charlyn Sora, MD 05/04/23 [phone]

## 2023-05-04 NOTE — Progress Notes (Signed)
 New Admission Note:  Arrival Method: By bed from ED around 2110 Mental Orientation: Alert and oriented x 4  Telemetry: Box 10, CCMD notified Assessment: Completed Skin: Completed, refer to flowsheets, refused to remove pants. IV: Right wrist S.L. Pain: 8/10 stomach pain, MD notified Tubes: Denies Safety Measures: Safety Fall Prevention Plan was given, discussed and signed. Admission: Completed [facility] Orientation: Patient has been orientated to the room, unit and the staff. Family: None  Orders have been reviewed and implemented. Will continue to monitor the patient. Call light has been placed within reach and bed alarm has been activated.   Bari Lor, RN  Phone Number: [phone]

## 2023-05-04 NOTE — ED Provider Notes (Signed)
 [place] [facility]ERGENCY DEPARTMENT AT [facility] Provider Note  CSN: [id number] Arrival date & time: 05/04/23 1134  Chief Complaint(s) Hypertension and Abdominal Pain  HPI Jennifer [name] is a 33 y.o. female here today from her pulmonologist office after she was noted to be hypertensive. Patient has a history of hyperbaric chylomicronemia, heart failure with reduced ejection fraction, pancreatitis, astrocytoma, insulin -dependent type 2 diabetes with gastroparesis, peripheral neuropathy, hypertension, CKD 3B, pancreatic insufficiency  Patient initially screened at triage by myself, recommended to stay in the emergency room pending further evaluation.  She did receive labetalol  for her hypertension.  Labs came back to show a potassium of 7.1, patient also has peaked T waves.  Patient also endorsing abdominal pain  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   CHF (congestive heart failure) (HCC)    Cholesterosis    CKD (chronic kidney disease) stage 4, GFR 15-29 ml/min (HCC) 05/13/2021   DM (diabetes mellitus) (HCC) 10/10/2018   Fatty liver    HTN (hypertension) 10/10/2018   Hypothyroidism 10/10/2018   Lipoprotein deficiency    Lung disease    longevity long term   Pancreatitis    Polycystic ovary syndrome    Patient Active Problem List   Diagnosis Date Noted   Pseudohyponatremia 04/10/2023   Metabolic acidosis 04/10/2023   Hyperchylomicronemia 04/10/2023   Peripheral neuropathy 04/10/2023   Type 2 MI (myocardial infarction) (HCC) 01/19/2023   Atypical chest pain 01/19/2023   Coronary artery disease involving native coronary artery of native heart without angina pectoris 01/18/2023   Chest pain due to GERD 01/18/2023   Enteritis 01/02/2023   Syncope 01/02/2023   LFT elevation 01/02/2023   Bladder wall thickening 01/02/2023   Hyperglycemia 09/19/2022   Hyperbilirubinemia  09/07/2022   UTI (urinary tract infection) due to Enterococcus 06/01/2022   E. coli UTI (urinary tract infection) 06/01/2022   DKA (diabetic ketoacidosis) (HCC) 06/01/2022   ASCUS of cervix with negative high risk HPV 05/31/2022   Pancreatitis 05/29/2022   Hypokalemia 03/23/2022   COVID 03/15/2022   COVID-19 virus infection 03/14/2022   Hyponatremia 03/14/2022   Positive D dimer 03/14/2022   Stage 3b chronic kidney disease (CKD) (HCC) 03/14/2022   Pulmonary hypertension (HCC) 02/06/2022   NASH (nonalcoholic steatohepatitis) 02/06/2022   Obesity (BMI 30-39.9) 02/06/2022   Heart failure (HCC) 12/03/2021   Acute on chronic systolic CHF (congestive heart failure) (HCC) 12/02/2021   Elevated troponin 12/02/2021   Pancytopenia (HCC) 12/02/2021   Amenorrhea 12/02/2021   Hepatic steatosis 09/27/2021   Hyperosmolar hyperglycemic state (HHS) (HCC) 08/26/2021   Small intestinal bacterial overgrowth (SIBO) 08/26/2021   Lower extremity edema 08/26/2021   Hyperkalemia 08/14/2021   Hx of insulin  dependent diabetes mellitus 08/14/2021   CKD (chronic kidney disease) stage 4, GFR 15-29 ml/min (HCC) 05/13/2021   Afib (HCC) 05/12/2021   Gastroparesis due to DM (HCC) 05/12/2021   Abdominal distension 05/12/2021   Chest pain 03/10/2021   Acute on chronic combined systolic and diastolic CHF (congestive heart failure) (HCC) 02/09/2021   History of astrocytoma of brain 02/09/2021   Type 2 diabetes mellitus with hyperglycemia, with long-term current use of insulin  (HCC) 01/25/2021   Diarrhea    Elevated transaminase level    Orthostatic hypotension 11/28/2020   Acute combined systolic and diastolic heart failure (HCC)    Nonischemic cardiomyopathy (HCC)    Acute decompensated heart failure (HCC)    Elevated liver enzymes  Acute kidney injury superimposed on stage 3b chronic kidney disease (HCC) 11/26/2020   Intractable abdominal pain 11/23/2020   Chronic systolic CHF (congestive heart failure)  (HCC) 11/23/2020   HTN (hypertension) 11/23/2020   Familial hypertriglyceridemia 11/23/2020   Prolonged QT interval 11/23/2020   Finger mass, left 08/12/2020   Nausea and vomiting 07/23/2020   Polycystic ovary syndrome 01/30/2020   Moderate aortic insufficiency 08/16/2019   Moderate mitral regurgitation 08/16/2019   Lipoprotein lipase deficiency, familial 06/19/2019   Microalbuminuria due to type 1 diabetes mellitus (HCC) 06/19/2019   Type 2 diabetes mellitus with hyperlipidemia (HCC) 10/10/2018   Hypothyroidism 10/10/2018   Home Medication(s) Prior to Admission medications   Medication Sig Start Date End Date Taking? Authorizing Provider  amitriptyline  (ELAVIL ) 10 MG tablet Take 10 mg by mouth at bedtime.   Yes [provider]  aspirin  EC 81 MG EC tablet Take 1 tablet (81 mg total) by mouth daily. Swallow whole. 12/07/20  Yes Fausto Sor A, DO  azelastine  (ASTELIN ) 0.1 % nasal spray Place 2 sprays into both nostrils daily at 6 (six) AM.   Yes [provider]  BLISOVI  FE 1.5/30 1.5-30 MG-MCG tablet Take 1 tablet by mouth daily.   Yes [provider]  butalbital -acetaminophen -caffeine  (FIORICET ) 50-325-40 MG tablet Take 1 tablet by mouth daily as needed for headache. 09/26/22  Yes [provider]  carvedilol  (COREG ) 3.125 MG tablet Take 1 tablet (3.125 mg total) by mouth 2 (two) times daily. 10/04/22  Yes [name], Daniel R, MD  CRANBERRY CONCENTRATE PO Take 2 tablets by mouth 2 (two) times daily.   Yes [provider]  CREON  36000-114000 units CPEP capsule Take 36,000 Units by mouth 3 (three) times daily.   Yes [provider]  DULoxetine  (CYMBALTA ) 60 MG capsule Take 60 mg by mouth at bedtime. 09/26/22  Yes [provider]  ergocalciferol  (VITAMIN D2) 1.25 MG (50000 UT) capsule Take 50,000 Units by mouth every 7 (seven) days. Every Sunday   Yes [provider]  Evolocumab  (REPATHA ) 140 MG/ML SOSY Inject 140 mg into the  skin every Saturday. Inject 140 mg subcutaneously every other Wednesday evening   Yes [provider]  fenofibrate  54 MG tablet Take 1 tablet (54 mg total) by mouth daily. Patient taking differently: Take 54 mg by mouth at bedtime. 12/01/22  Yes [name], Vinie BROCKS, MD  hydrALAZINE  (APRESOLINE ) 100 MG tablet Take 1 tablet (100 mg total) by mouth 3 (three) times daily. Patient taking differently: Take 50 mg by mouth 3 (three) times daily. 04/12/23  Yes Singh, Prashant K, MD  insulin  lispro (HUMALOG ) 100 UNIT/ML injection Inject 30-40 Units into the skin See admin instructions. Inject subcutaneously 40 units with breakfast, 30 units with lunch and 30 units with dinner   Yes [provider]  insulin  regular human CONCENTRATED (HUMULIN  R U-500 KWIKPEN) 500 UNIT/ML KwikPen Inject 185 Units into the skin 3 (three) times daily with meals. Patient taking differently: Inject 195 Units into the skin 2 (two) times daily with a meal. Inject subcutaneously 195 units with breakfast and lunch 09/20/22 01/09/24 Yes Ezenduka, Nkeiruka J, MD  ivabradine  (CORLANOR ) 7.5 MG TABS tablet Take 1 tablet (7.5 mg total) by mouth 2 (two) times daily with a meal. Patient taking differently: Take 7.5 mg by mouth at bedtime. 01/31/23  Yes Milford, Harlene HERO, FNP  levocetirizine (XYZAL ) 5 MG tablet Take 5 mg by mouth daily. 03/05/22  Yes [provider]  levothyroxine  (SYNTHROID ) 137 MCG tablet Take 137 mcg  by mouth daily. 04/12/23  Yes [provider]  pantoprazole  (PROTONIX ) 40 MG tablet Take 40 mg by mouth daily. 12/24/19  Yes [provider]  pregabalin  (LYRICA ) 300 MG capsule Take 300 mg by mouth 2 (two) times daily.   Yes [provider]  prochlorperazine  (COMPAZINE ) 5 MG tablet Take 5 mg by mouth at bedtime.   Yes [provider]  rosuvastatin  (CRESTOR ) 40 MG tablet Take 40 mg by mouth daily. 08/09/21  Yes [provider]  sodium bicarbonate  650 MG tablet Take 1,300  mg by mouth 2 (two) times daily.   Yes [provider]  SUMAtriptan  (IMITREX ) 25 MG tablet Take 25 mg by mouth every 2 (two) hours as needed. 01/30/23  Yes [provider]  topiramate  (TOPAMAX ) 50 MG tablet Take 50 mg by mouth 2 (two) times daily.   Yes [provider]  torsemide  (DEMADEX ) 20 MG tablet Take 2 tablets (40 mg total) by mouth daily. May take 1 additional tablet as needed 10/05/22  Yes [name], Daniel R, MD  tretinoin  (RETIN-A ) 0.05 % cream Apply 1 application topically at bedtime. 01/17/21  Yes [provider]  Insulin  Pen Needle 32G X 4 MM MISC 1 Device by Does not apply route QID. For use with insulin  pens 02/13/22   Cindy Garnette POUR, MD                                                                                                                                    Past Surgical History Past Surgical History:  Procedure Laterality Date   ABDOMINAL SURGERY     pt states during miscarriage got her intestine   BRAIN SURGERY     EYE MUSCLE SURGERY Right 03/28/2014   PRESSURE SENSOR/CARDIOMEMS N/A 12/06/2021   Procedure: PRESSURE SENSOR/CARDIOMEMS;  Surgeon: [name] Toribio SAUNDERS, MD;  Location: [facility] INVAS[facility];  Service: Cardiovascular;  Laterality: N/A;   RIGHT HEART CATH N/A 08/05/2021   Procedure: RIGHT HEART CATH;  Surgeon: [name] Toribio SAUNDERS, MD;  Location: [facility] INVAS[facility] CV [facility];  Service: Cardiovascular;  Laterality: N/A;   RIGHT HEART CATH N/A 12/06/2021   Procedure: RIGHT HEART CATH;  Surgeon: [name] Toribio SAUNDERS, MD;  Location: [facility] IN[facility] CV [facility];  Service: Cardiovascular;  Laterality: N/A;   RIGHT/LEFT HEART CATH AND CORONARY ANGIOGRAPHY N/A 12/03/2020   Procedure: RIGHT/LEFT HEART CATH AND CORONARY ANGIOGRAPHY;  Surgeon: Ladona Heinz, MD;  Location: [facility] CV [facility];  Service: Cardiovascular;  Laterality: N/A;   VENTRICULOSTOMY  03/28/1997   Family History Family History  Problem Relation Age of Onset   Diabetes Mother    Hypertension  Mother    Hyperlipidemia Mother    Thyroid  disease Mother    Hypertension Father    Diabetes Father    Pancreatic cancer Paternal Aunt    Pancreatic cancer Paternal Uncle    Colon cancer Neg Hx    Esophageal cancer Neg Hx  Stomach cancer Neg Hx     Social History Social History   Tobacco Use   Smoking status: Never   Smokeless tobacco: Never  Vaping Use   Vaping status: Never Used  Substance Use Topics   Alcohol  use: Never   Drug use: Never   Allergies Icosapent  ethyl (epa ethyl ester) (fish), Ketamine, [place], Morphine , Penicillins, Clarithromycin, Erythromycin , Fentanyl , Penicillin v, Shellfish allergy, and Prednisone  Review of Systems Review of Systems  Physical Exam Vital Signs  I have reviewed the triage vital signs BP (!) 171/100   Pulse (!) 104   Temp 98.6 F (37 C) (Oral)   Resp 18   LMP 03/22/2023   SpO2 98%   Physical Exam Vitals reviewed.  Constitutional:      Appearance: [name] not toxic-appearing.  Cardiovascular:     Rate and Rhythm: Tachycardia present.  Abdominal:     Palpations: Abdomen is soft.     Tenderness: There is generalized abdominal tenderness and tenderness in the epigastric area.     ED Results and Treatments Labs (all labs ordered are listed, but only abnormal results are displayed) Labs Reviewed  COMPREHENSIVE METABOLIC PANEL - Abnormal; Notable for the following components:      Result Value   Sodium 129 (*)    Potassium 7.1 (*)    Chloride 97 (*)    CO2 19 (*)    Glucose, Bld 335 (*)    BUN 36 (*)    Creatinine, Ser 1.83 (*)    Total Protein 9.5 (*)    AST 88 (*)    ALT 98 (*)    Total Bilirubin 1.5 (*)    GFR, Estimated 37 (*)    All other components within normal limits  CBG MONITORING, ED - Abnormal; Notable for the following components:   Glucose-Capillary 365 (*)    All other components within normal limits  I-STAT CG4 LACTIC ACID, ED - Abnormal; Notable for the following components:   Lactic Acid,  Venous 2.4 (*)    All other components within normal limits  I-STAT CHEM 8, ED - Abnormal; Notable for the following components:   Sodium 131 (*)    Potassium 7.7 (*)    BUN 50 (*)    Creatinine, Ser 2.10 (*)    Glucose, Bld 355 (*)    Calcium , Ion 1.12 (*)    TCO2 20 (*)    All other components within normal limits  I-STAT CHEM 8, ED - Abnormal; Notable for the following components:   Sodium 133 (*)    Potassium 5.6 (*)    BUN 37 (*)    Creatinine, Ser 2.10 (*)    Glucose, Bld 309 (*)    Calcium , Ion 1.13 (*)    TCO2 19 (*)    All other components within normal limits  LIPASE, BLOOD  CBC  BETA-HYDROXYBUTYRIC ACID  HCG, SERUM, QUALITATIVE  URINALYSIS, ROUTINE W REFLEX MICROSCOPIC  COMPREHENSIVE METABOLIC PANEL  ALDOSTERONE  I-STAT VENOUS BLOOD GAS, ED  Radiology No results found.  Pertinent labs & imaging results that were available during my care of the patient were reviewed by me and considered in my medical decision making (see MDM for details).  Medications Ordered in ED Medications  sodium zirconium cyclosilicate  (LOKELMA ) packet 10 g (has no administration in time range)  labetalol  (NORMODYNE ) injection 10 mg (10 mg Intravenous Given 05/04/23 1229)  calcium  gluconate 1 g/ 50 mL sodium chloride  IVPB (0 mg Intravenous Stopped 05/04/23 1440)  furosemide  (LASIX ) injection 40 mg (40 mg Intravenous Given 05/04/23 1321)  albuterol  (PROVENTIL ) (2.5 MG/3ML) 0.083% nebulizer solution 10 mg (10 mg Nebulization Given 05/04/23 1454)  insulin  aspart (novoLOG ) injection 10 Units (10 Units Subcutaneous Given 05/04/23 1331)  lactated ringers  bolus 1,000 mL (1,000 mLs Intravenous New Bag/Given 05/04/23 1346)  acetaminophen  (TYLENOL ) tablet 1,000 mg (1,000 mg Oral Given 05/04/23 1427)                                                                                                                                      Procedures .Critical Care  Performed by: Mannie Fairy DASEN, DO Authorized by: Mannie Fairy DASEN, DO   Critical care provider statement:    Critical care time (minutes):  85   Critical care was time spent personally by me on the following activities:  Development of treatment plan with patient or surrogate, discussions with consultants, evaluation of patient's response to treatment, examination of patient, ordering and review of laboratory studies, ordering and review of radiographic studies, ordering and performing treatments and interventions, pulse oximetry, re-evaluation of patient's condition and review of old charts   (including critical care time)  Medical Decision Making / ED Course   This patient presents to the ED for concern of hypertension and abdominal pain, this involves an extensive number of treatment options, and is a complaint that carries with it a high risk of complications and morbidity.  The differential diagnosis includes hyperkalemia, hypertension, pancreatitis, gastritis, intra-abdominal infection, chronic kidney disease  MDM: After identifying the patient's peaked T waves and her potassium, ordered calcium , albuterol , fluids and Lasix , and insulin  for the patient.  Patient moved from hallway to room and placed on a monitor.  Did reach out to renal team and spoke with Dr. Dennise who agreed with medical management, and including Lokelma , at this time.  Regarding the patient's abdominal pain, overall soft.  Will obtain noncontrasted CT imaging given the patient's acute kidney injury.  Patient has transaminitis that appears to be chronic for her, however this pain is new.  Reassessment 4 PM-patient be signed out to Dr. Bubba pending CT imaging of the abdomen.   Additional history obtained: -Additional history obtained from EMS -External records from outside source obtained and reviewed including: Chart review including previous  notes, labs, imaging, consultation notes   Lab Tests: -I ordered, reviewed, and interpreted labs.   The pertinent results include:   Labs Reviewed  COMPREHENSIVE METABOLIC PANEL - Abnormal; Notable for the following components:      Result Value   Sodium 129 (*)    Potassium 7.1 (*)    Chloride 97 (*)    CO2 19 (*)    Glucose, Bld 335 (*)    BUN 36 (*)    Creatinine, Ser 1.83 (*)    Total Protein 9.5 (*)    AST 88 (*)    ALT 98 (*)    Total Bilirubin 1.5 (*)    GFR, Estimated 37 (*)    All other components within normal limits  CBG MONITORING, ED - Abnormal; Notable for the following components:   Glucose-Capillary 365 (*)    All other components within normal limits  I-STAT CG4 LACTIC ACID, ED - Abnormal; Notable for the following components:   Lactic Acid, Venous 2.4 (*)    All other components within normal limits  I-STAT CHEM 8, ED - Abnormal; Notable for the following components:   Sodium 131 (*)    Potassium 7.7 (*)    BUN 50 (*)    Creatinine, Ser 2.10 (*)    Glucose, Bld 355 (*)    Calcium , Ion 1.12 (*)    TCO2 20 (*)    All other components within normal limits  I-STAT CHEM 8, ED - Abnormal; Notable for the following components:   Sodium 133 (*)    Potassium 5.6 (*)    BUN 37 (*)    Creatinine, Ser 2.10 (*)    Glucose, Bld 309 (*)    Calcium , Ion 1.13 (*)    TCO2 19 (*)    All other components within normal limits  LIPASE, BLOOD  CBC  BETA-HYDROXYBUTYRIC ACID  HCG, SERUM, QUALITATIVE  URINALYSIS, ROUTINE W REFLEX MICROSCOPIC  COMPREHENSIVE METABOLIC PANEL  ALDOSTERONE  I-STAT VENOUS BLOOD GAS, ED      EKG peaked T waves  EKG Interpretation Date/Time:  Thursday May 04 2023 11:43:35 EST Ventricular Rate:  119 PR Interval:  126 QRS Duration:  98 QT Interval:  348 QTC Calculation: 489 R Axis:   -25  Text Interpretation: Sinus tachycardia Left ventricular hypertrophy with repolarization abnormality ( R in aVL , Cornell product ) Abnormal ECG  When compared with ECG of 21-Apr-2023 00:05, PREVIOUS ECG IS PRESENT Confirmed by Mannie Pac [phone]) on 05/04/2023 12:58:57 PM         Imaging Studies ordered: I ordered imaging studies including CT imaging of the abdomen I independently visualized and interpreted imaging. I agree with the radiologist interpretation   Medicines ordered and prescription drug management: Meds ordered this encounter  Medications   labetalol  (NORMODYNE ) injection 10 mg   calcium  gluconate 1 g/ 50 mL sodium chloride  IVPB   furosemide  (LASIX ) injection 40 mg   albuterol  (PROVENTIL ) (2.5 MG/3ML) 0.083% nebulizer solution 10 mg   insulin  aspart (novoLOG ) injection 10 Units   sodium zirconium cyclosilicate  (LOKELMA ) packet 10 g   lactated ringers  bolus 1,000 mL   acetaminophen  (TYLENOL ) tablet 1,000 mg    -I have reviewed the patients home medicines and have made adjustments as needed  Critical interventions Hyperkalemia  Cardiac Monitoring: The patient was maintained on a cardiac monitor.  I personally viewed and interpreted the cardiac monitored which showed an underlying rhythm of: Sinus rhythm  Social Determinants of Health:  Factors impacting patients care include: Multiple medical comorbidities including chronic kidney disease   Reevaluation: After the interventions noted above, I reevaluated the patient and found that they have :improved  Co morbidities that complicate the patient evaluation  Past Medical History:  Diagnosis Date   Afib (HCC) 05/12/2021   Brain tumor (HCC) 03/29/1995   astrocytoma   CHF (congestive heart failure) (HCC)    Cholesterosis    CKD (chronic kidney disease) stage 4, GFR 15-29 ml/min (HCC) 05/13/2021   DM (diabetes mellitus) (HCC) 10/10/2018   Fatty liver    HTN (hypertension) 10/10/2018   Hypothyroidism 10/10/2018   Lipoprotein deficiency    Lung disease    longevity long term   Pancreatitis    Polycystic ovary syndrome        Dispostion: Signed out to Dr. Albertina.     Final Clinical Impression(s) / ED Diagnoses Final diagnoses:  Hyperkalemia     @PCDICTATION @    Mannie Pac T, DO 05/04/23 1601

## 2023-05-05 ENCOUNTER — Ambulatory Visit: Payer: Medicaid Other | Admitting: Podiatry

## 2023-05-05 DIAGNOSIS — E66812 Obesity, class 2: Secondary | ICD-10-CM | POA: Diagnosis present

## 2023-05-05 DIAGNOSIS — E1169 Type 2 diabetes mellitus with other specified complication: Secondary | ICD-10-CM

## 2023-05-05 DIAGNOSIS — I5042 Chronic combined systolic (congestive) and diastolic (congestive) heart failure: Secondary | ICD-10-CM | POA: Diagnosis not present

## 2023-05-05 DIAGNOSIS — Z794 Long term (current) use of insulin: Secondary | ICD-10-CM | POA: Diagnosis not present

## 2023-05-05 DIAGNOSIS — Z79890 Hormone replacement therapy (postmenopausal): Secondary | ICD-10-CM | POA: Diagnosis not present

## 2023-05-05 DIAGNOSIS — Z6831 Body mass index (BMI) 31.0-31.9, adult: Secondary | ICD-10-CM | POA: Diagnosis not present

## 2023-05-05 DIAGNOSIS — K861 Other chronic pancreatitis: Secondary | ICD-10-CM | POA: Diagnosis not present

## 2023-05-05 DIAGNOSIS — R109 Unspecified abdominal pain: Secondary | ICD-10-CM | POA: Diagnosis not present

## 2023-05-05 DIAGNOSIS — K5900 Constipation, unspecified: Secondary | ICD-10-CM | POA: Diagnosis not present

## 2023-05-05 DIAGNOSIS — K760 Fatty (change of) liver, not elsewhere classified: Secondary | ICD-10-CM | POA: Diagnosis not present

## 2023-05-05 DIAGNOSIS — I169 Hypertensive crisis, unspecified: Secondary | ICD-10-CM | POA: Diagnosis not present

## 2023-05-05 DIAGNOSIS — I130 Hypertensive heart and chronic kidney disease with heart failure and stage 1 through stage 4 chronic kidney disease, or unspecified chronic kidney disease: Secondary | ICD-10-CM | POA: Diagnosis not present

## 2023-05-05 DIAGNOSIS — E875 Hyperkalemia: Secondary | ICD-10-CM | POA: Diagnosis not present

## 2023-05-05 DIAGNOSIS — Z8616 Personal history of COVID-19: Secondary | ICD-10-CM | POA: Diagnosis not present

## 2023-05-05 DIAGNOSIS — E785 Hyperlipidemia, unspecified: Secondary | ICD-10-CM | POA: Diagnosis not present

## 2023-05-05 DIAGNOSIS — I10 Essential (primary) hypertension: Secondary | ICD-10-CM | POA: Diagnosis not present

## 2023-05-05 DIAGNOSIS — N179 Acute kidney failure, unspecified: Secondary | ICD-10-CM | POA: Diagnosis not present

## 2023-05-05 DIAGNOSIS — E1122 Type 2 diabetes mellitus with diabetic chronic kidney disease: Secondary | ICD-10-CM | POA: Diagnosis not present

## 2023-05-05 DIAGNOSIS — E871 Hypo-osmolality and hyponatremia: Secondary | ICD-10-CM | POA: Diagnosis present

## 2023-05-05 DIAGNOSIS — R162 Hepatomegaly with splenomegaly, not elsewhere classified: Secondary | ICD-10-CM | POA: Diagnosis not present

## 2023-05-05 DIAGNOSIS — K3184 Gastroparesis: Secondary | ICD-10-CM | POA: Diagnosis present

## 2023-05-05 DIAGNOSIS — Z85841 Personal history of malignant neoplasm of brain: Secondary | ICD-10-CM

## 2023-05-05 DIAGNOSIS — E1143 Type 2 diabetes mellitus with diabetic autonomic (poly)neuropathy: Secondary | ICD-10-CM | POA: Diagnosis not present

## 2023-05-05 DIAGNOSIS — E1165 Type 2 diabetes mellitus with hyperglycemia: Secondary | ICD-10-CM | POA: Diagnosis present

## 2023-05-05 DIAGNOSIS — R000 Tachycardia, unspecified: Secondary | ICD-10-CM | POA: Diagnosis not present

## 2023-05-05 DIAGNOSIS — D649 Anemia, unspecified: Secondary | ICD-10-CM | POA: Diagnosis not present

## 2023-05-05 DIAGNOSIS — E8779 Other fluid overload: Secondary | ICD-10-CM | POA: Diagnosis not present

## 2023-05-05 DIAGNOSIS — I4891 Unspecified atrial fibrillation: Secondary | ICD-10-CM | POA: Diagnosis present

## 2023-05-05 DIAGNOSIS — N390 Urinary tract infection, site not specified: Secondary | ICD-10-CM | POA: Diagnosis not present

## 2023-05-05 DIAGNOSIS — E039 Hypothyroidism, unspecified: Secondary | ICD-10-CM

## 2023-05-05 DIAGNOSIS — I5030 Unspecified diastolic (congestive) heart failure: Secondary | ICD-10-CM | POA: Diagnosis not present

## 2023-05-05 DIAGNOSIS — E872 Acidosis, unspecified: Secondary | ICD-10-CM | POA: Diagnosis not present

## 2023-05-05 DIAGNOSIS — Z7982 Long term (current) use of aspirin: Secondary | ICD-10-CM | POA: Diagnosis not present

## 2023-05-05 DIAGNOSIS — I2510 Atherosclerotic heart disease of native coronary artery without angina pectoris: Secondary | ICD-10-CM | POA: Diagnosis present

## 2023-05-05 DIAGNOSIS — M79671 Pain in right foot: Secondary | ICD-10-CM | POA: Diagnosis present

## 2023-05-05 DIAGNOSIS — N184 Chronic kidney disease, stage 4 (severe): Secondary | ICD-10-CM | POA: Diagnosis not present

## 2023-05-05 DIAGNOSIS — M79672 Pain in left foot: Secondary | ICD-10-CM | POA: Diagnosis not present

## 2023-05-05 LAB — GLUCOSE, CAPILLARY
Glucose-Capillary: 254 mg/dL — ABNORMAL HIGH (ref 70–99)
Glucose-Capillary: 204 mg/dL — ABNORMAL HIGH (ref 70–99)
Glucose-Capillary: 161 mg/dL — ABNORMAL HIGH (ref 70–99)
Glucose-Capillary: 208 mg/dL — ABNORMAL HIGH (ref 70–99)

## 2023-05-05 LAB — RENAL FUNCTION PANEL
Albumin: 3.8 g/dL (ref 3.5–5.0)
Anion gap: 16 — ABNORMAL HIGH (ref 5–15)
BUN: 37 mg/dL — ABNORMAL HIGH (ref 6–20)
CO2: 18 mmol/L — ABNORMAL LOW (ref 22–32)
Calcium: 8.9 mg/dL (ref 8.9–10.3)
Chloride: 98 mmol/L (ref 98–111)
Creatinine, Ser: 1.88 mg/dL — ABNORMAL HIGH (ref 0.44–1.00)
GFR, Estimated: 36 mL/min — ABNORMAL LOW (ref >60)
Glucose, Bld: 234 mg/dL — ABNORMAL HIGH (ref 70–99)
Phosphorus: 4.9 mg/dL — ABNORMAL HIGH (ref 2.5–4.6)
Potassium: 4.5 mmol/L (ref 3.5–5.1)
Sodium: 132 mmol/L — ABNORMAL LOW (ref 135–145)

## 2023-05-05 LAB — CBC
HCT: 35.4 % — ABNORMAL LOW (ref 36.0–46.0)
Hemoglobin: 11.8 g/dL — ABNORMAL LOW (ref 12.0–15.0)
MCH: 31.1 pg (ref 26.0–34.0)
MCHC: 33.3 g/dL (ref 30.0–36.0)
MCV: 93.2 fL (ref 80.0–100.0)
Platelets: 161 10*3/uL (ref 150–400)
RBC: 3.8 MIL/uL — ABNORMAL LOW (ref 3.87–5.11)
RDW: 15.4 % (ref 11.5–15.5)
WBC: 8.2 10*3/uL (ref 4.0–10.5)
nRBC: 0 % (ref 0.0–0.2)

## 2023-05-05 LAB — PROTIME-INR
INR: 1.1 (ref 0.8–1.2)
Prothrombin Time: 14 s (ref 11.4–15.2)

## 2023-05-05 LAB — TROPONIN I (HIGH SENSITIVITY): Troponin I (High Sensitivity): 25 ng/L — ABNORMAL HIGH (ref <18)

## 2023-05-05 LAB — APTT: aPTT: 31 s (ref 24–36)

## 2023-05-05 MED ORDER — TAMSULOSIN HCL 0.4 MG PO CAPS
0.4000 mg | ORAL_CAPSULE | Freq: Every day | ORAL | Status: DC
  Administered 2023-05-05 – 2023-05-10 (×6): 0.4 mg via ORAL
  Filled 2023-05-05 (×6): qty 1

## 2023-05-05 MED ORDER — DIPHENHYDRAMINE-ZINC ACETATE 2-0.1 % EX CREA
TOPICAL_CREAM | Freq: Three times a day (TID) | CUTANEOUS | Status: DC | PRN
  Administered 2023-05-05: 1 via TOPICAL
  Filled 2023-05-05: qty 28

## 2023-05-05 MED ORDER — BISACODYL 5 MG PO TBEC: 10.0000 mg | DELAYED_RELEASE_TABLET | Freq: Once | ORAL | Status: DC

## 2023-05-05 MED ORDER — INSULIN GLARGINE-YFGN 100 UNIT/ML ~~LOC~~ SOLN
20.0000 [IU] | Freq: Every day | SUBCUTANEOUS | Status: DC
  Administered 2023-05-05 – 2023-05-07 (×3): 20 [IU] via SUBCUTANEOUS
  Filled 2023-05-05 (×4): qty 0.2

## 2023-05-05 NOTE — Progress Notes (Signed)
 Pt c/o itching even after taking Benadryl . RN messaged MD on call.   Jennifer [name]

## 2023-05-05 NOTE — Progress Notes (Signed)
 Patient ID: Jennifer [name], female   DOB: Jul 17, 1990, 33 y.o.   MRN: [id number]  [facility] Progress Note   Assessment/ Plan:   1.  Hyperkalemia: Appears acute on chronic and may indeed be associated with distal RTA (type IV) associated with diabetes mellitus.  Recommend discharging home on Lokelma  10 g daily along with torsemide  40 mg daily and sodium bicarbonate  for continued potassium control.  Discussed/reminded her about low potassium diet. 2.  Chronic kidney disease stage IIIB-IV: Transitioning care from [facility] nephrology to [facility] with upcoming appointment next week.  Renal biopsy showed chronic changes from diabetes and hypertension per patient. 3.  Hypertension: Blood pressure marginally elevated, continue current antihypertensive therapy. 4.  Hyponatremia: Appears to be associated with hyperglycemia, monitor with ongoing management. 5.  Urinary hesitancy: She reports that she feels like she has a full bladder but cannot void.  Will check a bladder scan and perform In-N-Out catheterization along with initiation of tamsulosin .  Subjective:   Denies any chest pain or shortness of breath and complains of having a full bladder/sensation of urinating but not able to urinate.   Objective:   BP (!) 142/92 (BP Location: Right Arm)   Pulse (!) 112   Temp 98.1 F (36.7 C) (Oral)   Resp 18   LMP 03/22/2023   SpO2 96%   Intake/Output Summary (Last 24 hours) at 05/05/2023 1104 Last data filed at 05/05/2023 0857 Gross per 24 hour  Intake 340 ml  Output 0 ml  Net 340 ml   Weight change:   Physical Exam: Gen: Comfortably resting in bed CVS: Pulse regular tachycardia, S1 and S2 normal Resp: Clear to auscultation bilaterally, no rales/rhonchi Abd: Soft, tender bladder distended over suprapubic area, bowel sounds normal Ext: No lower extremity edema  Imaging: CT ABDOMEN PELVIS WO CONTRAST Result Date: 05/04/2023 CLINICAL DATA:  Abdominal pain. EXAM: CT ABDOMEN AND  PELVIS WITHOUT CONTRAST TECHNIQUE: Multidetector CT imaging of the abdomen and pelvis was performed following the standard protocol without IV contrast. RADIATION DOSE REDUCTION: This exam was performed according to the departmental dose-optimization program which includes automated exposure control, adjustment of the mA and/or kV according to patient size and/or use of iterative reconstruction technique. COMPARISON:  CT abdomen pelvis dated January 13, 2023. FINDINGS: Lower chest: No acute abnormality. Hepatobiliary: Liver remains enlarged with mildly decreased density but no focal abnormality. Normal gallbladder. No biliary dilatation. Pancreas: Unremarkable. No pancreatic ductal dilatation or surrounding inflammatory changes. Spleen: Borderline enlarged, unchanged.  No focal abnormality. Adrenals/Urinary Tract: Adrenal glands are unremarkable. Chronic renal cortical thinning again noted. No renal calculi, focal lesion, or hydronephrosis. Bladder is unremarkable. Stomach/Bowel: Stomach is within normal limits. High density material within the appendix, which is otherwise unremarkable. No evidence of bowel wall thickening, distention, or inflammatory changes. Vascular/Lymphatic: Aortic atherosclerosis. No enlarged abdominal or pelvic lymph nodes. Reproductive: Uterus and bilateral adnexa are unremarkable. Other: No free fluid or pneumoperitoneum. Musculoskeletal: No acute or significant osseous findings. IMPRESSION: 1. No acute intra-abdominal process. 2. Unchanged hepatomegaly with steatosis. 3.  Aortic Atherosclerosis (ICD10-I70.0). Electronically Signed   By: Elsie ONEIDA Shoulder M.D.   On: 05/04/2023 16:42    Labs: BMET Recent Labs  Lab 04/28/23 1528 05/04/23 1145 05/04/23 1302 05/04/23 1459 05/04/23 1640 05/05/23 0201  NA 138 129* 131* 133* 134* 132*  K 4.3 7.1* 7.7* 5.6* 4.8 4.5  CL 104 97* 110 110 103 98  CO2 23 19*  --   --  19* 18*  GLUCOSE 69* 335* 355* 309*  259* 234*  BUN 52* 36* 50* 37* 37*  37*  CREATININE 2.23* 1.83* 2.10* 2.10* 1.92* 1.88*  CALCIUM  10.1 9.3  --   --  9.3 8.9  PHOS  --   --   --   --   --  4.9*   CBC Recent Labs  Lab 04/28/23 1528 05/04/23 1145 05/04/23 1302 05/04/23 1459 05/05/23 0201  WBC 7.3 8.6  --   --  8.2  HGB 13.2 14.6 14.6 12.9 11.8*  HCT 40.3 42.7 43.0 38.0 35.4*  MCV 93.3 92.2  --   --  93.2  PLT 213 180  --   --  161    Medications:     amitriptyline   10 mg Oral QHS   aspirin  EC  81 mg Oral Daily   azelastine   2 spray Each Nare Q0600   carvedilol   3.125 mg Oral BID   DULoxetine   60 mg Oral QHS   [START ON 05/13/2023] Evolocumab   140 mg Subcutaneous Q14 Days   fenofibrate   54 mg Oral QHS   heparin   5,000 Units Subcutaneous Q8H   hydrALAZINE   50 mg Oral TID   insulin  aspart  0-5 Units Subcutaneous QHS   insulin  aspart  0-9 Units Subcutaneous TID WC   insulin  aspart  30 Units Subcutaneous BID WC   insulin  aspart  40 Units Subcutaneous Q breakfast   insulin  glargine-yfgn  15 Units Subcutaneous QHS   ivabradine   7.5 mg Oral QHS   levothyroxine   137 mcg Oral Q0600   lipase/protease/amylase  36,000 Units Oral TID   loratadine   10 mg Oral Daily   norethindrone -ethinyl estradiol -iron  1 tablet Oral Daily   pantoprazole   40 mg Oral Daily   pregabalin   300 mg Oral BID   rosuvastatin   40 mg Oral Daily   sodium bicarbonate   1,300 mg Oral BID   sodium chloride  flush  3 mL Intravenous Q12H   sodium zirconium cyclosilicate   10 g Oral Daily   topiramate   50 mg Oral BID   torsemide   40 mg Oral Daily   tretinoin   1 application  Topical QHS   Gordy Blanch, MD 05/05/2023, 11:04 AM

## 2023-05-05 NOTE — Progress Notes (Signed)
   05/04/23 2127  Assess: MEWS Score  Temp 98 F (36.7 C)  BP (!) 189/111  MAP (mmHg) 132  Pulse Rate (!) 112  Resp 19  SpO2 100 %  O2 Device Room Air  Assess: MEWS Score  MEWS Temp 0  MEWS Systolic 0  MEWS Pulse 2  MEWS RR 0  MEWS LOC 0  MEWS Score 2  MEWS Score Color Yellow  Assess: if the MEWS score is Yellow or Red  Were vital signs accurate and taken at a resting state? Yes  Does the patient meet 2 or more of the SIRS criteria? No  MEWS guidelines implemented  Yes, yellow  Treat  MEWS Interventions Considered administering scheduled or prn medications/treatments as ordered  Take Vital Signs  Increase Vital Sign Frequency  Yellow: Q2hr x1, continue Q4hrs until patient remains green for 12hrs  Escalate  MEWS: Escalate Yellow: Discuss with charge nurse and consider notifying provider and/or RRT  Notify: Charge Nurse/RN  Name of Charge Nurse/RN Notified Charito RN  Assess: SIRS CRITERIA  SIRS Temperature  0  SIRS Respirations  0  SIRS Pulse 1  SIRS WBC 0  SIRS Score Sum  1

## 2023-05-05 NOTE — Progress Notes (Signed)
 [facility]                                                                               Jennifer [name], is a 33 y.o. female, DOB - 05/30/1990, FMW:[id number] Admit date - 05/04/2023    Outpatient Primary MD for the patient is Emilio Joesph DEL, PA-C  LOS - 0  days    Brief summary   Jennifer [name] is a 33 y.o. female with medical history significant of Hyperchylomicronemia, HFrEF, pancreatitis, astrocytoma, insulin -dependent type 2 diabetes with gastroparesis and peripheral neuropathy, hypertension, CKD 3B, pancreatic insufficiency patient's last hospitalization was in April 12, 2023 when she was discharged from [facility] health for life-threatening hyperkalemia , was supposed to see nephrology on discharge, but didn't happen, was seen at doctors office today for OSA, was found to be hypertensive and was referred to [facility] for further evaluation.  Nephrology consulted for hyperkalemia   Assessment & Plan    Assessment and Plan: * Hyperkalemia Treated, repeat potassium is within normal limits Nephrology recommends to continue with Lokelma  10 g daily along with torsemide  and sodium bicarbonate   Intractable abdominal pain Unclear etiology patient CT abdomen pelvis is unremarkable Patient reports urinary hesitancy. Bladder scan ordered currently patient is pain-free She denies any nausea or vomiting  History of astrocytoma of brain Chronic ptosis.  This is believed to be in remission  Pancreatitis C/w creon    Acute combined systolic and diastolic heart failure (HCC) Appears Euvolemic  HTN (hypertension) On admission, BP elevated.  Better controlled now.  Would resume torsemide  40 mg daily, hydralazine  50 mg TID and coreg  3.125 mg BID.   Hypothyroidism Continue with Synthroid   Type 2 diabetes mellitus with hyperlipidemia (HCC) CBG (last 3)  Recent Labs    05/05/23 0736 05/05/23 1133 05/05/23 1646  GLUCAP 254* 204* 161*   Uncontrolled with  hyperglycemia.  Hemoglobin A1c is 6.9 in October 2020 4 repeat A1c today.  Continue with fenofibrate     Constipation Continue with MiraLAX  and senna Colace.   Stage IIIb and  stage IV CKD Nephrology on board and appreciate recommendations.      Estimated body mass index is 31.16 kg/m as calculated from the following:   Height as of an earlier encounter on 05/04/23: 4' 9 (1.448 m).   Weight as of an earlier encounter on 05/04/23: 65.3 kg.  Code Status: full code.  DVT Prophylaxis:  heparin  injection 5,000 Units Start: 05/05/23 0600   Level of Care: Level of care: Med-Surg Family Communication: none at bedside.   Disposition Plan:     Remains inpatient appropriate:  IV antibiotoics for UTI.   Procedures:    Consultants:   Nephrology.   Antimicrobials:   Anti-infectives (From admission, onward)    Start     Dose/Rate Route Frequency Ordered Stop   05/04/23 2030  cefTRIAXone  (ROCEPHIN ) 1 g in sodium chloride  0.9 % 100 mL IVPB        1 g 200 mL/hr over 30 Minutes Intravenous Every 24 hours 05/04/23 2017          Medications  Scheduled Meds:  amitriptyline   10 mg Oral QHS   aspirin  EC  81 mg Oral  Daily   azelastine   2 spray Each Nare Q0600   carvedilol   3.125 mg Oral BID   DULoxetine   60 mg Oral QHS   [START ON 05/13/2023] Evolocumab   140 mg Subcutaneous Q14 Days   fenofibrate   54 mg Oral QHS   heparin   5,000 Units Subcutaneous Q8H   hydrALAZINE   50 mg Oral TID   insulin  aspart  0-5 Units Subcutaneous QHS   insulin  aspart  0-9 Units Subcutaneous TID WC   insulin  aspart  30 Units Subcutaneous BID WC   insulin  aspart  40 Units Subcutaneous Q breakfast   insulin  glargine-yfgn  20 Units Subcutaneous QHS   ivabradine   7.5 mg Oral QHS   levothyroxine   137 mcg Oral Q0600   lipase/protease/amylase  36,000 Units Oral TID   loratadine   10 mg Oral Daily   norethindrone -ethinyl estradiol -iron  1 tablet Oral Daily   pantoprazole   40 mg Oral Daily   pregabalin   300 mg  Oral BID   rosuvastatin   40 mg Oral Daily   sodium bicarbonate   1,300 mg Oral BID   sodium chloride  flush  3 mL Intravenous Q12H   sodium zirconium cyclosilicate   10 g Oral Daily   tamsulosin   0.4 mg Oral Daily   topiramate   50 mg Oral BID   torsemide   40 mg Oral Daily   Continuous Infusions:  cefTRIAXone  (ROCEPHIN )  IV 1 g (05/04/23 2235)   PRN Meds:.acetaminophen  **OR** acetaminophen , butalbital -acetaminophen -caffeine , diphenhydrAMINE -zinc  acetate, hydrALAZINE , HYDROcodone -acetaminophen , labetalol , polyethylene glycol, SUMAtriptan     Subjective:   Margaretta Fordham was seen and examined today. [name] any abdominal pain. No chest pain or sob.   Objective:   Vitals:   05/05/23 0551 05/05/23 0734 05/05/23 1131 05/05/23 1645  BP: 121/73 (!) 142/92 (!) 112/59 128/77  Pulse: (!) 105 (!) 112 (!) 113 (!) 110  Resp: 18 18  18   Temp: 98.6 F (37 C) 98.1 F (36.7 C) 98.6 F (37 C) 98.3 F (36.8 C)  TempSrc:  Oral Oral Oral  SpO2: 96% 96% 96% 94%    Intake/Output Summary (Last 24 hours) at 05/05/2023 1646 Last data filed at 05/05/2023 1250 Gross per 24 hour  Intake 340 ml  Output 650 ml  Net -310 ml   There were no vitals filed for this visit.   Exam General exam: Appears calm and comfortable , chornic pTOSIS. Respiratory system: Clear to auscultation. Respiratory effort normal. Cardiovascular system: S1 & S2 heard, RRR.  Gastrointestinal system: Abdomen is soft, mildly distended, no tenderness.  Central nervous system: Alert and oriented. Extremities: Symmetric 5 x 5 power. Skin: No rashes,  Psychiatry:  Mood & affect appropriate.    Data Reviewed:  I have personally reviewed following labs and imaging studies   CBC Lab Results  Component Value Date   WBC 8.2 05/05/2023   RBC 3.80 (L) 05/05/2023   HGB 11.8 (L) 05/05/2023   HCT 35.4 (L) 05/05/2023   MCV 93.2 05/05/2023   MCH 31.1 05/05/2023   PLT 161 05/05/2023   MCHC 33.3 05/05/2023   RDW 15.4 05/05/2023    LYMPHSABS 2.4 04/19/2023   MONOABS 0.4 04/19/2023   EOSABS 0.1 04/19/2023   BASOSABS 0.0 04/19/2023     Last metabolic panel Lab Results  Component Value Date   NA 132 (L) 05/05/2023   K 4.5 05/05/2023   CL 98 05/05/2023   CO2 18 (L) 05/05/2023   BUN 37 (H) 05/05/2023   CREATININE 1.88 (H) 05/05/2023   GLUCOSE 234 (H) 05/05/2023  GFRNONAA 36 (L) 05/05/2023   GFRAA 68 08/22/2019   CALCIUM  8.9 05/05/2023   PHOS 4.9 (H) 05/05/2023   PROT 7.3 05/04/2023   ALBUMIN  3.8 05/05/2023   BILITOT 0.7 05/04/2023   ALKPHOS 71 05/04/2023   AST 55 (H) 05/04/2023   ALT 81 (H) 05/04/2023   ANIONGAP 16 (H) 05/05/2023    CBG (last 3)  Recent Labs    05/04/23 2129 05/05/23 0736 05/05/23 1133  GLUCAP 241* 254* 204*      Coagulation Profile: Recent Labs  Lab 05/05/23 0201  INR 1.1     Radiology Studies: CT ABDOMEN PELVIS WO CONTRAST Result Date: 05/04/2023 CLINICAL DATA:  Abdominal pain. EXAM: CT ABDOMEN AND PELVIS WITHOUT CONTRAST TECHNIQUE: Multidetector CT imaging of the abdomen and pelvis was performed following the standard protocol without IV contrast. RADIATION DOSE REDUCTION: This exam was performed according to the departmental dose-optimization program which includes automated exposure control, adjustment of the mA and/or kV according to patient size and/or use of iterative reconstruction technique. COMPARISON:  CT abdomen pelvis dated January 13, 2023. FINDINGS: Lower chest: No acute abnormality. Hepatobiliary: Liver remains enlarged with mildly decreased density but no focal abnormality. Normal gallbladder. No biliary dilatation. Pancreas: Unremarkable. No pancreatic ductal dilatation or surrounding inflammatory changes. Spleen: Borderline enlarged, unchanged.  No focal abnormality. Adrenals/Urinary Tract: Adrenal glands are unremarkable. Chronic renal cortical thinning again noted. No renal calculi, focal lesion, or hydronephrosis. Bladder is unremarkable. Stomach/Bowel:  Stomach is within normal limits. High density material within the appendix, which is otherwise unremarkable. No evidence of bowel wall thickening, distention, or inflammatory changes. Vascular/Lymphatic: Aortic atherosclerosis. No enlarged abdominal or pelvic lymph nodes. Reproductive: Uterus and bilateral adnexa are unremarkable. Other: No free fluid or pneumoperitoneum. Musculoskeletal: No acute or significant osseous findings. IMPRESSION: 1. No acute intra-abdominal process. 2. Unchanged hepatomegaly with steatosis. 3.  Aortic Atherosclerosis (ICD10-I70.0). Electronically Signed   By: Elsie ONEIDA Shoulder M.D.   On: 05/04/2023 16:42       Elgie Butter M.D. [facility] 05/05/2023, 4:46 PM  Available via Epic secure chat 7am-7pm After 7 pm, please refer to night coverage provider listed on amion.

## 2023-05-06 ENCOUNTER — Inpatient Hospital Stay (HOSPITAL_COMMUNITY): Payer: Medicaid Other

## 2023-05-06 DIAGNOSIS — I10 Essential (primary) hypertension: Secondary | ICD-10-CM | POA: Diagnosis not present

## 2023-05-06 DIAGNOSIS — R748 Abnormal levels of other serum enzymes: Secondary | ICD-10-CM | POA: Diagnosis not present

## 2023-05-06 DIAGNOSIS — R109 Unspecified abdominal pain: Secondary | ICD-10-CM | POA: Diagnosis not present

## 2023-05-06 DIAGNOSIS — K7689 Other specified diseases of liver: Secondary | ICD-10-CM | POA: Diagnosis not present

## 2023-05-06 DIAGNOSIS — Z85841 Personal history of malignant neoplasm of brain: Secondary | ICD-10-CM | POA: Diagnosis not present

## 2023-05-06 DIAGNOSIS — E875 Hyperkalemia: Secondary | ICD-10-CM | POA: Diagnosis not present

## 2023-05-06 LAB — BASIC METABOLIC PANEL
Anion gap: 16 — ABNORMAL HIGH (ref 5–15)
BUN: 58 mg/dL — ABNORMAL HIGH (ref 6–20)
CO2: 19 mmol/L — ABNORMAL LOW (ref 22–32)
Calcium: 8.2 mg/dL — ABNORMAL LOW (ref 8.9–10.3)
Chloride: 95 mmol/L — ABNORMAL LOW (ref 98–111)
Creatinine, Ser: 2.4 mg/dL — ABNORMAL HIGH (ref 0.44–1.00)
GFR, Estimated: 27 mL/min — ABNORMAL LOW (ref >60)
Glucose, Bld: 338 mg/dL — ABNORMAL HIGH (ref 70–99)
Potassium: 4 mmol/L (ref 3.5–5.1)
Sodium: 130 mmol/L — ABNORMAL LOW (ref 135–145)

## 2023-05-06 LAB — URINE CULTURE: Culture: >=100000 — AB

## 2023-05-06 LAB — GLUCOSE, CAPILLARY
Glucose-Capillary: 295 mg/dL — ABNORMAL HIGH (ref 70–99)
Glucose-Capillary: 125 mg/dL — ABNORMAL HIGH (ref 70–99)
Glucose-Capillary: 187 mg/dL — ABNORMAL HIGH (ref 70–99)
Glucose-Capillary: 317 mg/dL — ABNORMAL HIGH (ref 70–99)

## 2023-05-06 LAB — HEMOGLOBIN A1C
Hgb A1c MFr Bld: 6.1 % — ABNORMAL HIGH (ref 4.8–5.6)
Mean Plasma Glucose: 128.37 mg/dL

## 2023-05-06 MED ORDER — BISACODYL 10 MG RE SUPP: 10.0000 mg | Freq: Every day | RECTAL | Status: DC | PRN

## 2023-05-06 MED ORDER — OXYCODONE HCL 5 MG PO TABS
5.0000 mg | ORAL_TABLET | Freq: Once | ORAL | Status: AC | PRN
  Administered 2023-05-06: 5 mg via ORAL
  Filled 2023-05-06: qty 1

## 2023-05-06 MED ORDER — POLYVINYL ALCOHOL 1.4 % OP SOLN
1.0000 [drp] | OPHTHALMIC | Status: DC | PRN
  Administered 2023-05-06 – 2023-05-09 (×3): 1 [drp] via OPHTHALMIC
  Filled 2023-05-06: qty 15

## 2023-05-06 MED ORDER — ORAL CARE MOUTH RINSE: 15.0000 mL | OROMUCOSAL | Status: DC | PRN

## 2023-05-06 MED ORDER — HYDROMORPHONE HCL 1 MG/ML IJ SOLN
0.5000 mg | INTRAMUSCULAR | Status: AC | PRN
  Administered 2023-05-06 – 2023-05-07 (×3): 0.5 mg via INTRAVENOUS
  Filled 2023-05-06 (×3): qty 0.5

## 2023-05-06 MED ORDER — HYDROMORPHONE HCL 1 MG/ML IJ SOLN
0.5000 mg | Freq: Once | INTRAMUSCULAR | Status: AC | PRN
  Administered 2023-05-06: 0.5 mg via INTRAVENOUS
  Filled 2023-05-06: qty 0.5

## 2023-05-06 NOTE — Progress Notes (Signed)
 Patient ID: Jennifer [name], female   DOB: February 20, 1991, 33 y.o.   MRN: [id number] [facility] Progress Note   Assessment/ Plan:   1.  Hyperkalemia: Appears acute on chronic and may indeed be associated with distal RTA (type IV) associated with diabetes mellitus.  Potassium level controlled/normal on Lokelma . 2.  Chronic kidney disease stage IIIB-IV: Transitioning care from [facility] nephrology to [facility] with upcoming appointment next week.  Renal biopsy showed chronic changes from diabetes and hypertension per patient.  Fluctuating renal function likely related to hyperglycemia/osmotic shifts. 3.  Hypertension: Blood pressure marginally elevated, continue current antihypertensive therapy. 4.  Hyponatremia: Appears to be associated with hyperglycemia, monitor with ongoing management. 5.  Urinary hesitancy: Yesterday started on tamsulosin .  She informs me that the bladder scan showed large volume of urine but I do not see this documented to confirm.  Subjective:   Reports that she continues to have some difficulty passing urine and dysuria.  Yesterday started on tamsulosin .   Objective:   BP 132/86 (BP Location: Right Arm)   Pulse (!) 103   Temp 98.2 F (36.8 C) (Oral)   Resp 18   LMP 03/22/2023   SpO2 94%   Intake/Output Summary (Last 24 hours) at 05/06/2023 1019 Last data filed at 05/06/2023 0745 Gross per 24 hour  Intake 2251 ml  Output 1200 ml  Net 1051 ml   Weight change:   Physical Exam: Gen: Comfortably ambulating around her room CVS: Pulse regular tachycardia, S1 and S2 normal Resp: Clear to auscultation bilaterally, no rales/rhonchi Abd: Soft, bilateral lower quadrant/suprapubic tenderness.  Normal bowel sounds. Ext: No lower extremity edema  Imaging: CT ABDOMEN PELVIS WO CONTRAST Result Date: 05/04/2023 CLINICAL DATA:  Abdominal pain. EXAM: CT ABDOMEN AND PELVIS WITHOUT CONTRAST TECHNIQUE: Multidetector CT imaging of the abdomen and pelvis was performed  following the standard protocol without IV contrast. RADIATION DOSE REDUCTION: This exam was performed according to the departmental dose-optimization program which includes automated exposure control, adjustment of the mA and/or kV according to patient size and/or use of iterative reconstruction technique. COMPARISON:  CT abdomen pelvis dated January 13, 2023. FINDINGS: Lower chest: No acute abnormality. Hepatobiliary: Liver remains enlarged with mildly decreased density but no focal abnormality. Normal gallbladder. No biliary dilatation. Pancreas: Unremarkable. No pancreatic ductal dilatation or surrounding inflammatory changes. Spleen: Borderline enlarged, unchanged.  No focal abnormality. Adrenals/Urinary Tract: Adrenal glands are unremarkable. Chronic renal cortical thinning again noted. No renal calculi, focal lesion, or hydronephrosis. Bladder is unremarkable. Stomach/Bowel: Stomach is within normal limits. High density material within the appendix, which is otherwise unremarkable. No evidence of bowel wall thickening, distention, or inflammatory changes. Vascular/Lymphatic: Aortic atherosclerosis. No enlarged abdominal or pelvic lymph nodes. Reproductive: Uterus and bilateral adnexa are unremarkable. Other: No free fluid or pneumoperitoneum. Musculoskeletal: No acute or significant osseous findings. IMPRESSION: 1. No acute intra-abdominal process. 2. Unchanged hepatomegaly with steatosis. 3.  Aortic Atherosclerosis (ICD10-I70.0). Electronically Signed   By: Elsie ONEIDA Shoulder M.D.   On: 05/04/2023 16:42    Labs: BMET Recent Labs  Lab 05/04/23 1145 05/04/23 1302 05/04/23 1459 05/04/23 1640 05/05/23 0201 05/06/23 0502  NA 129* 131* 133* 134* 132* 130*  K 7.1* 7.7* 5.6* 4.8 4.5 4.0  CL 97* 110 110 103 98 95*  CO2 19*  --   --  19* 18* 19*  GLUCOSE 335* 355* 309* 259* 234* 338*  BUN 36* 50* 37* 37* 37* 58*  CREATININE 1.83* 2.10* 2.10* 1.92* 1.88* 2.40*  CALCIUM  9.3  --   --  9.3 8.9 8.2*  PHOS   --   --   --   --  4.9*  --    CBC Recent Labs  Lab 05/04/23 1145 05/04/23 1302 05/04/23 1459 05/05/23 0201  WBC 8.6  --   --  8.2  HGB 14.6 14.6 12.9 11.8*  HCT 42.7 43.0 38.0 35.4*  MCV 92.2  --   --  93.2  PLT 180  --   --  161    Medications:     amitriptyline   10 mg Oral QHS   aspirin  EC  81 mg Oral Daily   azelastine   2 spray Each Nare Q0600   bisacodyl   10 mg Oral Once   carvedilol   3.125 mg Oral BID   DULoxetine   60 mg Oral QHS   [START ON 05/13/2023] Evolocumab   140 mg Subcutaneous Q14 Days   fenofibrate   54 mg Oral QHS   heparin   5,000 Units Subcutaneous Q8H   hydrALAZINE   50 mg Oral TID   insulin  aspart  0-5 Units Subcutaneous QHS   insulin  aspart  0-9 Units Subcutaneous TID WC   insulin  aspart  30 Units Subcutaneous BID WC   insulin  aspart  40 Units Subcutaneous Q breakfast   insulin  glargine-yfgn  20 Units Subcutaneous QHS   ivabradine   7.5 mg Oral QHS   levothyroxine   137 mcg Oral Q0600   lipase/protease/amylase  36,000 Units Oral TID   loratadine   10 mg Oral Daily   norethindrone -ethinyl estradiol -iron  1 tablet Oral Daily   pantoprazole   40 mg Oral Daily   pregabalin   300 mg Oral BID   rosuvastatin   40 mg Oral Daily   sodium bicarbonate   1,300 mg Oral BID   sodium chloride  flush  3 mL Intravenous Q12H   sodium zirconium cyclosilicate   10 g Oral Daily   tamsulosin   0.4 mg Oral Daily   topiramate   50 mg Oral BID   torsemide   40 mg Oral Daily   Gordy Blanch, MD 05/06/2023, 10:19 AM

## 2023-05-06 NOTE — Progress Notes (Signed)
 [facility]                                                                               Jennifer [name], is a 33 y.o. female, DOB - Jan 02, 1991, FMW:[id number] Admit date - 05/04/2023    Outpatient Primary MD for the patient is Jennifer Joesph DEL, PA-C  LOS - 1  days    Brief summary   Jennifer [name] is a 33 y.o. female with medical history significant of Hyperchylomicronemia, HFrEF, pancreatitis, astrocytoma, insulin -dependent type 2 diabetes with gastroparesis and peripheral neuropathy, hypertension, CKD 3B, pancreatic insufficiency patient's last hospitalization was in April 12, 2023 when she was discharged from [facility] health for life-threatening hyperkalemia , was supposed to see nephrology on discharge, but didn't happen, was seen at doctors office today for OSA, was found to be hypertensive and was referred to [facility] for further evaluation.  Nephrology consulted for hyperkalemia   Assessment & Plan    Assessment and Plan: * Hyperkalemia Treated, repeat potassium is within normal limits Nephrology recommends to continue with Lokelma  10 g daily along with torsemide  and sodium bicarbonate   Intractable abdominal pain Unclear etiology, CT abdomen pelvis is unremarkable Patient reports urinary hesitancy and sensation of incomplete bladder emptying.  Bladder scan ordered showed only 70 ml of residual urine.  Plan for in and out cath once today.  She denies any nausea and vomiting .  Had an episode of severe abdominal pain not resolved with 2 doses of oxycodone ,,relieved with a dose of dilaudid .    Possible UTI;  UA shows moderate leukocytes and rare bacteria.  She does have a h/o os ESBL klebsiella UTI.  She was started on ceftriaxone  and urine cultures are pending.    History of astrocytoma of brain Chronic ptosis.  This is believed to be in remission  Pancreatitis C/w creon    Acute combined systolic and diastolic heart failure (HCC) Appears  Euvolemic  HTN (hypertension) On admission, BP elevated.  Better controlled now.  Would resume torsemide  40 mg daily, hydralazine  50 mg TID and coreg  3.125 mg BID.   Hypothyroidism Continue with Synthroid    Hyponatremia:  Unclear etiology, from CKD?     Type 2 diabetes mellitus with hyperlipidemia (HCC) CBG (last 3)  Recent Labs    05/05/23 1646 05/05/23 2040 05/06/23 0725  GLUCAP 161* 208* 295*   Uncontrolled with hyperglycemia.  Hemoglobin A1c is 6.1%   Hyperlipidemia Continue with fenofibrate     Constipation Continue with MiraLAX  and senna ,Colace.   Stage IIIb and  stage IV CKD Creatinine at 2.4 today, worsened from 1.8.  Looks like her baseline creatinine is between 1.7 to 2.4.  Nephrology on board and appreciate recommendations.  Mildly elevated liver enzymes Intermittent abdominal pain.  Get US  abdomen of the RUQ, repeat liver enzymes and lipase levels.       Estimated body mass index is 31.16 kg/m as calculated from the following:   Height as of an earlier encounter on 05/04/23: 4' 9 (1.448 m).   Weight as of an earlier encounter on 05/04/23: 65.3 kg.  Code Status: full code.  DVT Prophylaxis:  heparin  injection 5,000 Units Start: 05/05/23 0600   Level of  Care: Level of care: Med-Surg Family Communication: none at bedside.   Disposition Plan:     Remains inpatient appropriate:  IV antibiotoics for UTI.   Procedures:  none  Consultants:   Nephrology.   Antimicrobials:   Anti-infectives (From admission, onward)    Start     Dose/Rate Route Frequency Ordered Stop   05/04/23 2030  cefTRIAXone  (ROCEPHIN ) 1 g in sodium chloride  0.9 % 100 mL IVPB        1 g 200 mL/hr over 30 Minutes Intravenous Every 24 hours 05/04/23 2017          Medications  Scheduled Meds:  amitriptyline   10 mg Oral QHS   aspirin  EC  81 mg Oral Daily   azelastine   2 spray Each Nare Q0600   bisacodyl   10 mg Oral Once   carvedilol   3.125 mg Oral BID    DULoxetine   60 mg Oral QHS   [START ON 05/13/2023] Evolocumab   140 mg Subcutaneous Q14 Days   fenofibrate   54 mg Oral QHS   heparin   5,000 Units Subcutaneous Q8H   hydrALAZINE   50 mg Oral TID   insulin  aspart  0-5 Units Subcutaneous QHS   insulin  aspart  0-9 Units Subcutaneous TID WC   insulin  aspart  30 Units Subcutaneous BID WC   insulin  aspart  40 Units Subcutaneous Q breakfast   insulin  glargine-yfgn  20 Units Subcutaneous QHS   ivabradine   7.5 mg Oral QHS   levothyroxine   137 mcg Oral Q0600   lipase/protease/amylase  36,000 Units Oral TID   loratadine   10 mg Oral Daily   norethindrone -ethinyl estradiol -iron  1 tablet Oral Daily   pantoprazole   40 mg Oral Daily   pregabalin   300 mg Oral BID   rosuvastatin   40 mg Oral Daily   sodium bicarbonate   1,300 mg Oral BID   sodium chloride  flush  3 mL Intravenous Q12H   sodium zirconium cyclosilicate   10 g Oral Daily   tamsulosin   0.4 mg Oral Daily   topiramate   50 mg Oral BID   torsemide   40 mg Oral Daily   Continuous Infusions:  cefTRIAXone  (ROCEPHIN )  IV 1 g (05/05/23 2039)   PRN Meds:.acetaminophen  **OR** acetaminophen , butalbital -acetaminophen -caffeine , diphenhydrAMINE -zinc  acetate, hydrALAZINE , HYDROcodone -acetaminophen , labetalol , polyethylene glycol, polyvinyl alcohol , SUMAtriptan     Subjective:   Jennifer [name] was seen and examined today. Severe abdominal pain over night.   Objective:   Vitals:   05/05/23 2041 05/06/23 0147 05/06/23 0530 05/06/23 0923  BP: 123/84 104/71 (!) 107/52 132/86  Pulse: (!) 121 (!) 101 93 (!) 103  Resp: 18 18 18 18   Temp: 97.8 F (36.6 C) 98.4 F (36.9 C) 98.2 F (36.8 C) 98.2 F (36.8 C)  TempSrc: Oral Oral Oral Oral  SpO2: 96% 97% 94% 94%    Intake/Output Summary (Last 24 hours) at 05/06/2023 1046 Last data filed at 05/06/2023 0745 Gross per 24 hour  Intake 2251 ml  Output 1200 ml  Net 1051 ml   There were no vitals filed for this visit.   Exam General exam: Appears calm and  comfortable  Respiratory system: Clear to auscultation. Respiratory effort normal. Cardiovascular system: S1 & S2 heard, RRR.  Gastrointestinal system: Abdomen is soft , distended, bs+ Central nervous system: Alert and oriented.  Extremities: Symmetric 5 x 5 power. Skin: No rashes,  Psychiatry:  Mood & affect appropriate.     Data Reviewed:  I have personally reviewed following labs and imaging studies   CBC Lab Results  Component  Value Date   WBC 8.2 05/05/2023   RBC 3.80 (L) 05/05/2023   HGB 11.8 (L) 05/05/2023   HCT 35.4 (L) 05/05/2023   MCV 93.2 05/05/2023   MCH 31.1 05/05/2023   PLT 161 05/05/2023   MCHC 33.3 05/05/2023   RDW 15.4 05/05/2023   LYMPHSABS 2.4 04/19/2023   MONOABS 0.4 04/19/2023   EOSABS 0.1 04/19/2023   BASOSABS 0.0 04/19/2023     Last metabolic panel Lab Results  Component Value Date   NA 130 (L) 05/06/2023   K 4.0 05/06/2023   CL 95 (L) 05/06/2023   CO2 19 (L) 05/06/2023   BUN 58 (H) 05/06/2023   CREATININE 2.40 (H) 05/06/2023   GLUCOSE 338 (H) 05/06/2023   GFRNONAA 27 (L) 05/06/2023   GFRAA 68 08/22/2019   CALCIUM  8.2 (L) 05/06/2023   PHOS 4.9 (H) 05/05/2023   PROT 7.3 05/04/2023   ALBUMIN  3.8 05/05/2023   BILITOT 0.7 05/04/2023   ALKPHOS 71 05/04/2023   AST 55 (H) 05/04/2023   ALT 81 (H) 05/04/2023   ANIONGAP 16 (H) 05/06/2023    CBG (last 3)  Recent Labs    05/05/23 1646 05/05/23 2040 05/06/23 0725  GLUCAP 161* 208* 295*      Coagulation Profile: Recent Labs  Lab 05/05/23 0201  INR 1.1     Radiology Studies: CT ABDOMEN PELVIS WO CONTRAST Result Date: 05/04/2023 CLINICAL DATA:  Abdominal pain. EXAM: CT ABDOMEN AND PELVIS WITHOUT CONTRAST TECHNIQUE: Multidetector CT imaging of the abdomen and pelvis was performed following the standard protocol without IV contrast. RADIATION DOSE REDUCTION: This exam was performed according to the departmental dose-optimization program which includes automated exposure control,  adjustment of the mA and/or kV according to patient size and/or use of iterative reconstruction technique. COMPARISON:  CT abdomen pelvis dated January 13, 2023. FINDINGS: Lower chest: No acute abnormality. Hepatobiliary: Liver remains enlarged with mildly decreased density but no focal abnormality. Normal gallbladder. No biliary dilatation. Pancreas: Unremarkable. No pancreatic ductal dilatation or surrounding inflammatory changes. Spleen: Borderline enlarged, unchanged.  No focal abnormality. Adrenals/Urinary Tract: Adrenal glands are unremarkable. Chronic renal cortical thinning again noted. No renal calculi, focal lesion, or hydronephrosis. Bladder is unremarkable. Stomach/Bowel: Stomach is within normal limits. High density material within the appendix, which is otherwise unremarkable. No evidence of bowel wall thickening, distention, or inflammatory changes. Vascular/Lymphatic: Aortic atherosclerosis. No enlarged abdominal or pelvic lymph nodes. Reproductive: Uterus and bilateral adnexa are unremarkable. Other: No free fluid or pneumoperitoneum. Musculoskeletal: No acute or significant osseous findings. IMPRESSION: 1. No acute intra-abdominal process. 2. Unchanged hepatomegaly with steatosis. 3.  Aortic Atherosclerosis (ICD10-I70.0). Electronically Signed   By: Elsie ONEIDA Shoulder M.D.   On: 05/04/2023 16:42       Elgie Butter M.D. [facility] 05/06/2023, 10:46 AM  Available via Epic secure chat 7am-7pm After 7 pm, please refer to night coverage provider listed on amion.

## 2023-05-06 NOTE — Plan of Care (Signed)
  Problem: Education: Goal: Ability to describe self-care measures that may prevent or decrease complications (Diabetes Survival Skills Education) will improve Outcome: Progressing Goal: Individualized Educational Video(s) Outcome: Progressing   Problem: Coping: Goal: Ability to adjust to condition or change in health will improve Outcome: Progressing   Problem: Fluid Volume: Goal: Ability to maintain a balanced intake and output will improve Outcome: Progressing   Problem: Health Behavior/Discharge Planning: Goal: Ability to identify and utilize available resources and services will improve Outcome: Progressing Goal: Ability to manage health-related needs will improve Outcome: Progressing   Problem: Metabolic: Goal: Ability to maintain appropriate glucose levels will improve Outcome: Progressing   Problem: Nutritional: Goal: Maintenance of adequate nutrition will improve Outcome: Progressing Goal: Progress toward achieving an optimal weight will improve Outcome: Progressing   Problem: Skin Integrity: Goal: Risk for impaired skin integrity will decrease Outcome: Progressing   Problem: Tissue Perfusion: Goal: Adequacy of tissue perfusion will improve Outcome: Progressing   Problem: Education: Goal: Knowledge of General Education information will improve Description: Including pain rating scale, medication(s)/side effects and non-pharmacologic comfort measures Outcome: Progressing   Problem: Health Behavior/Discharge Planning: Goal: Ability to manage health-related needs will improve Outcome: Progressing   Problem: Clinical Measurements: Goal: Ability to maintain clinical measurements within normal limits will improve Outcome: Progressing Goal: Will remain free from infection Outcome: Progressing Goal: Diagnostic test results will improve Outcome: Progressing Goal: Respiratory complications will improve Outcome: Progressing Goal: Cardiovascular complication will  be avoided Outcome: Progressing   Problem: Activity: Goal: Risk for activity intolerance will decrease Outcome: Progressing   Problem: Nutrition: Goal: Adequate nutrition will be maintained Outcome: Progressing   Problem: Coping: Goal: Level of anxiety will decrease Outcome: Progressing   Problem: Elimination: Goal: Will not experience complications related to bowel motility Outcome: Progressing   Problem: Pain Managment: Goal: General experience of comfort will improve and/or be controlled Outcome: Progressing   Problem: Safety: Goal: Ability to remain free from injury will improve Outcome: Progressing   Problem: Skin Integrity: Goal: Risk for impaired skin integrity will decrease Outcome: Progressing   Problem: Elimination: Goal: Will not experience complications related to urinary retention Outcome: Not Progressing  Foley placed for acute retentions

## 2023-05-06 NOTE — Progress Notes (Signed)
 During the night, the patient consistently complained of left lower quadrant pain, rating as 7/10 or higher.  [name] able to urinate.  Bladder Scan at approximately 0600 showed 68 cc in the bladder.  She states the pain is aching in nature.  PRN Norco given at 2218 and 0408 with minimum relief.  Dr. Charlton made aware each time and PO oxycodone  5 mg given at 0121 and 0503 with minimum relief.  Dr. Charlton made aware of continued severe pain.  Order received for 0.5 mg IV Dilaudid .  This was given at 0650.  Will continue to monitor patient.  Suzen Ice RN

## 2023-05-07 ENCOUNTER — Inpatient Hospital Stay (HOSPITAL_COMMUNITY): Payer: Medicaid Other

## 2023-05-07 DIAGNOSIS — R160 Hepatomegaly, not elsewhere classified: Secondary | ICD-10-CM | POA: Diagnosis not present

## 2023-05-07 DIAGNOSIS — Z85841 Personal history of malignant neoplasm of brain: Secondary | ICD-10-CM | POA: Diagnosis not present

## 2023-05-07 DIAGNOSIS — I10 Essential (primary) hypertension: Secondary | ICD-10-CM | POA: Diagnosis not present

## 2023-05-07 DIAGNOSIS — E875 Hyperkalemia: Secondary | ICD-10-CM | POA: Diagnosis not present

## 2023-05-07 DIAGNOSIS — R109 Unspecified abdominal pain: Secondary | ICD-10-CM | POA: Diagnosis not present

## 2023-05-07 LAB — RENAL FUNCTION PANEL
Albumin: 3.9 g/dL (ref 3.5–5.0)
Anion gap: 17 — ABNORMAL HIGH (ref 5–15)
BUN: 66 mg/dL — ABNORMAL HIGH (ref 6–20)
CO2: 21 mmol/L — ABNORMAL LOW (ref 22–32)
Calcium: 8 mg/dL — ABNORMAL LOW (ref 8.9–10.3)
Chloride: 90 mmol/L — ABNORMAL LOW (ref 98–111)
Creatinine, Ser: 2.72 mg/dL — ABNORMAL HIGH (ref 0.44–1.00)
GFR, Estimated: 23 mL/min — ABNORMAL LOW (ref >60)
Glucose, Bld: 265 mg/dL — ABNORMAL HIGH (ref 70–99)
Phosphorus: 5.2 mg/dL — ABNORMAL HIGH (ref 2.5–4.6)
Potassium: 3.5 mmol/L (ref 3.5–5.1)
Sodium: 128 mmol/L — ABNORMAL LOW (ref 135–145)

## 2023-05-07 LAB — GLUCOSE, CAPILLARY
Glucose-Capillary: 241 mg/dL — ABNORMAL HIGH (ref 70–99)
Glucose-Capillary: 124 mg/dL — ABNORMAL HIGH (ref 70–99)
Glucose-Capillary: 110 mg/dL — ABNORMAL HIGH (ref 70–99)
Glucose-Capillary: 113 mg/dL — ABNORMAL HIGH (ref 70–99)
Glucose-Capillary: 142 mg/dL — ABNORMAL HIGH (ref 70–99)

## 2023-05-07 LAB — LIPASE, BLOOD: Lipase: 24 U/L (ref 11–51)

## 2023-05-07 MED ORDER — ALBUMIN HUMAN 25 % IV SOLN
50.0000 g | Freq: Once | INTRAVENOUS | Status: AC
  Administered 2023-05-07: 50 g via INTRAVENOUS
  Filled 2023-05-07: qty 200

## 2023-05-07 MED ORDER — PANCRELIPASE (LIP-PROT-AMYL) 36000-114000 UNITS PO CPEP
36000.0000 [IU] | ORAL_CAPSULE | Freq: Three times a day (TID) | ORAL | Status: DC
  Administered 2023-05-07 – 2023-05-10 (×9): 36000 [IU] via ORAL
  Filled 2023-05-07 (×12): qty 1

## 2023-05-07 MED ORDER — CHLORHEXIDINE GLUCONATE CLOTH 2 % EX PADS
6.0000 | MEDICATED_PAD | Freq: Every day | CUTANEOUS | Status: DC
  Administered 2023-05-07 – 2023-05-10 (×4): 6 via TOPICAL

## 2023-05-07 NOTE — Progress Notes (Addendum)
 Patient ID: Jennifer [name], female   DOB: 1990/11/25, 33 y.o.   MRN: [id number] [facility] Progress Note   Assessment/ Plan:   1.  Hyperkalemia: Appears acute on chronic and may indeed be associated with distal RTA (type IV) associated with diabetes mellitus.  Potassium level controlled/normal on Lokelma . 2.  Chronic kidney disease stage IIIB-IV: Baseline creatinine 1.7-2.3 and in the process of transitioning care from [facility] nephrology to [place] [facility].  Renal biopsy showed chronic hypertensive/diabetic kidney disease with mild FSGS.  She has decent urine output with higher creatinine/BUN today which I suspect might be hemodynamically mediated.  Unfortunately torsemide  dose already given this morning and will be held tomorrow.  Will give albumin  bolus to limit excessive volume to help improve expand intravascular volume. 3.  Hypertension: Blood pressure marginally elevated, continue current antihypertensive therapy. 4.  Hyponatremia: Appears to be associated with hyperglycemia, monitor with ongoing management. 5.  Urinary hesitancy: No previous evidence of obstruction and likely had an episode of bladder retention improving with in and out catheterization.  Will discontinue Claritin  that may have contributed to her urinary retention.  I will repeat renal imaging if renal function worsens.  Subjective:   Complains of intermittent stabbing left lower quadrant pain radiating to her kidney.  Denies any chest pain or shortness of breath.  Objective:   BP 129/83 (BP Location: Right Arm)   Pulse 100   Temp 98 F (36.7 C) (Oral)   Resp 18   LMP 03/22/2023   SpO2 93%   Intake/Output Summary (Last 24 hours) at 05/07/2023 1024 Last data filed at 05/07/2023 0910 Gross per 24 hour  Intake 2310 ml  Output 2900 ml  Net -590 ml   Weight change:   Physical Exam: Gen: Appears comfortable resting in bed, watching television CVS: Pulse regular tachycardia, S1 and S2 normal Resp: Clear  to auscultation bilaterally, no rales/rhonchi Abd: Soft, protuberant with tenderness over both lower quadrants/suprapubic area without obvious guarding. Ext: No lower extremity edema  Imaging: US  Abdomen Limited Result Date: 05/06/2023 CLINICAL DATA:  Elevated liver enzymes EXAM: ULTRASOUND ABDOMEN LIMITED RIGHT UPPER QUADRANT COMPARISON:  CT abdomen pelvis 05/04/2023 FINDINGS: Gallbladder: Gallstones: None Sludge: None Gallbladder Wall: Within normal limits Pericholecystic fluid: None Sonographic Murphy's Sign: Negative per technologist Common bile duct: Diameter: 4 mm Liver: Parenchymal echogenicity: Homogeneously increased Contours: Normal Lesions: None Portal vein: Patent.  Hepatopetal flow Other: None. IMPRESSION: Diffuse increased echogenicity of the hepatic parenchyma is a nonspecific indicator of hepatocellular dysfunction, most commonly steatosis. Electronically Signed   By: Aliene Lloyd M.D.   On: 05/06/2023 16:11    Labs: BMET Recent Labs  Lab 05/04/23 1145 05/04/23 1302 05/04/23 1459 05/04/23 1640 05/05/23 0201 05/06/23 0502 05/07/23 0832  NA 129* 131* 133* 134* 132* 130* 128*  K 7.1* 7.7* 5.6* 4.8 4.5 4.0 3.5  CL 97* 110 110 103 98 95* 90*  CO2 19*  --   --  19* 18* 19* 21*  GLUCOSE 335* 355* 309* 259* 234* 338* 265*  BUN 36* 50* 37* 37* 37* 58* 66*  CREATININE 1.83* 2.10* 2.10* 1.92* 1.88* 2.40* 2.72*  CALCIUM  9.3  --   --  9.3 8.9 8.2* 8.0*  PHOS  --   --   --   --  4.9*  --  5.2*   CBC Recent Labs  Lab 05/04/23 1145 05/04/23 1302 05/04/23 1459 05/05/23 0201  WBC 8.6  --   --  8.2  HGB 14.6 14.6 12.9 11.8*  HCT 42.7  43.0 38.0 35.4*  MCV 92.2  --   --  93.2  PLT 180  --   --  161    Medications:     amitriptyline   10 mg Oral QHS   aspirin  EC  81 mg Oral Daily   azelastine   2 spray Each Nare Q0600   carvedilol   3.125 mg Oral BID   DULoxetine   60 mg Oral QHS   [START ON 05/13/2023] Evolocumab   140 mg Subcutaneous Q14 Days   fenofibrate   54 mg Oral QHS    heparin   5,000 Units Subcutaneous Q8H   hydrALAZINE   50 mg Oral TID   insulin  aspart  0-5 Units Subcutaneous QHS   insulin  aspart  0-9 Units Subcutaneous TID WC   insulin  aspart  30 Units Subcutaneous BID WC   insulin  aspart  40 Units Subcutaneous Q breakfast   insulin  glargine-yfgn  20 Units Subcutaneous QHS   ivabradine   7.5 mg Oral QHS   levothyroxine   137 mcg Oral Q0600   lipase/protease/amylase  36,000 Units Oral TID WC   loratadine   10 mg Oral Daily   norethindrone -ethinyl estradiol -iron  1 tablet Oral Daily   pantoprazole   40 mg Oral Daily   pregabalin   300 mg Oral BID   rosuvastatin   40 mg Oral Daily   sodium bicarbonate   1,300 mg Oral BID   sodium chloride  flush  3 mL Intravenous Q12H   sodium zirconium cyclosilicate   10 g Oral Daily   tamsulosin   0.4 mg Oral Daily   topiramate   50 mg Oral BID   Gordy Blanch, MD 05/07/2023, 10:24 AM

## 2023-05-07 NOTE — Plan of Care (Signed)
  Problem: Education: Goal: Ability to describe self-care measures that may prevent or decrease complications (Diabetes Survival Skills Education) will improve Outcome: Progressing Goal: Individualized Educational Video(s) Outcome: Progressing   Problem: Fluid Volume: Goal: Ability to maintain a balanced intake and output will improve Outcome: Progressing   Problem: Health Behavior/Discharge Planning: Goal: Ability to identify and utilize available resources and services will improve Outcome: Progressing Goal: Ability to manage health-related needs will improve Outcome: Progressing   Problem: Metabolic: Goal: Ability to maintain appropriate glucose levels will improve Outcome: Progressing   Problem: Nutritional: Goal: Maintenance of adequate nutrition will improve Outcome: Progressing Goal: Progress toward achieving an optimal weight will improve Outcome: Progressing   Problem: Skin Integrity: Goal: Risk for impaired skin integrity will decrease Outcome: Progressing   Problem: Tissue Perfusion: Goal: Adequacy of tissue perfusion will improve Outcome: Progressing   Problem: Education: Goal: Knowledge of General Education information will improve Description: Including pain rating scale, medication(s)/side effects and non-pharmacologic comfort measures Outcome: Progressing   Problem: Health Behavior/Discharge Planning: Goal: Ability to manage health-related needs will improve Outcome: Progressing   Problem: Clinical Measurements: Goal: Will remain free from infection Outcome: Progressing Goal: Diagnostic test results will improve Outcome: Progressing Goal: Respiratory complications will improve Outcome: Progressing Goal: Cardiovascular complication will be avoided Outcome: Progressing   Problem: Activity: Goal: Risk for activity intolerance will decrease Outcome: Progressing   Problem: Nutrition: Goal: Adequate nutrition will be maintained Outcome:  Progressing   Problem: Coping: Goal: Level of anxiety will decrease Outcome: Progressing   Problem: Elimination: Goal: Will not experience complications related to bowel motility Outcome: Progressing Goal: Will not experience complications related to urinary retention Outcome: Progressing   Problem: Safety: Goal: Ability to remain free from injury will improve Outcome: Progressing   Problem: Skin Integrity: Goal: Risk for impaired skin integrity will decrease Outcome: Progressing   Problem: Clinical Measurements: Goal: Ability to maintain clinical measurements within normal limits will improve Outcome: Not Progressing, creatinine elevated   Problem: Pain Managment: Goal: General experience of comfort will improve and/or be controlled Outcome: Not Progressing  Abd pain being evaluated by MD

## 2023-05-07 NOTE — Progress Notes (Signed)
   05/06/23 2242  Pain Assessment  Pain Scale 0-10  Pain Score 9  Pain Type Acute pain  Pain Location Abdomen  Pain Orientation Left;Lower  Pain Descriptors / Indicators Aching  Pain Frequency Constant  Pain Onset On-going  Patients Stated Pain Goal 0  Pain Intervention(s) Emotional support;MD notified (Comment)  Provider Notification  Provider Name/Title Dr. Charlton  Date Provider Notified 05/06/23  Time Provider Notified 2242  Method of Notification Page  Notification Reason Change in status  Provider response See new orders  Date of Provider Response 05/06/23   Patient complaining of LLQ abdominal pain - rating as 8/10 or higher. Norco given @ 2150 and she states her pain has worsened to 9/10. Dr. Charlton made aware and new orders received for IV Dilaudid .  Patient's pain is currently at 3/10 after Dilaudid  and Norco.  Will continue to monitor patient.  Suzen Ice RN

## 2023-05-07 NOTE — Progress Notes (Signed)
 Jennifer [name]                                                                               Jennifer [name], is a 33 y.o. female, DOB - 1991/03/08, FMW:[id number] Admit date - 05/04/2023    Outpatient Primary MD for the patient is Emilio Joesph DEL, PA-C  LOS - 2  days    Brief summary   Jennifer [name] is a 33 y.o. female with medical history significant of Hyperchylomicronemia, HFrEF, pancreatitis, astrocytoma, insulin -dependent type 2 diabetes with gastroparesis and peripheral neuropathy, hypertension, CKD 3B, pancreatic insufficiency patient's last hospitalization was in April 12, 2023 when she was discharged from [facility] health for life-threatening hyperkalemia , was supposed to see nephrology on discharge, but didn't happen, was seen at doctors office today for OSA, was found to be hypertensive and was referred to [facility] for further evaluation.  Nephrology consulted for hyperkalemia   Assessment & Plan    Assessment and Plan: * Hyperkalemia Treated, repeat potassium is within normal limits Nephrology recommends to continue with Lokelma  10 g daily .  Intractable abdominal pain Unclear etiology, CT abdomen pelvis is unremarkable. ? Urinary retention? S/p foley catheter placeemnt.  She denies any nausea and vomiting .  But overnight she had left upper quadrant pain radiating to the back requiring a dose of dilaudid .  Will get abd x ray.  She reports having a BM yesterday evening.    Possible UTI;  UA shows moderate leukocytes and rare bacteria.  She does have a h/o os ESBL klebsiella UTI.  She was started on ceftriaxone  and urine cultures are pending.   Urinary retention:  In and out twice done yesterday and she had 550 ml on the last in and out catheterization.  So foley catheter was placed. Pt reports the lower abd pain has improved and she feels relieved.     History of astrocytoma of brain Chronic ptosis.  This is believed to be in  remission  Pancreatitis C/w creon    Acute combined systolic and diastolic heart failure (HCC) Appears Euvolemic  HTN (hypertension) Well controlled. Torsemide  held for AKI.   Hypothyroidism Continue with Synthroid    Hyponatremia:  Worsening of hyponatremia today from 130 to 128 along with worsening of renal parameters.     Type 2 diabetes mellitus with hyperlipidemia (HCC) CBG (last 3)  Recent Labs    05/07/23 0718 05/07/23 1132 05/07/23 1621  GLUCAP 241* 124* 110*   .  Hemoglobin A1c is 6.1%. continue with SSI.    Hyperlipidemia Continue with fenofibrate     Constipation Continue with MiraLAX  and senna ,Colace.    Acute on Stage IIIb - IV CKD Looks like her baseline creatinine is between 1.7 to 2.4. worsened to 2. 7. Holding torsemide  .  Nephrology on board and appreciate recommendations.  Mildly elevated liver enzymes Intermittent abdominal pain.  US  abdomen of the RUQ,  unremarkable except for hepatic steatosis.  Recheck liver panel in one week.       Estimated body mass index is 31.16 kg/m as calculated from the following:   Height as of an earlier encounter on 05/04/23: 4' 9 (1.448 m).   Weight as of an  earlier encounter on 05/04/23: 65.3 kg.  Code Status: full code.  DVT Prophylaxis:  heparin  injection 5,000 Units Start: 05/05/23 0600   Level of Care: Level of care: Med-Surg Family Communication: none at bedside.   Disposition Plan:     Remains inpatient appropriate:  IV antibiotoics for UTI.   Procedures:  none  Consultants:   Nephrology.   Antimicrobials:   Anti-infectives (From admission, onward)    Start     Dose/Rate Route Frequency Ordered Stop   05/04/23 2030  cefTRIAXone  (ROCEPHIN ) 1 g in sodium chloride  0.9 % 100 mL IVPB        1 g 200 mL/hr over 30 Minutes Intravenous Every 24 hours 05/04/23 2017          Medications  Scheduled Meds:  amitriptyline   10 mg Oral QHS   aspirin  EC  81 mg Oral Daily   azelastine   2  spray Each Nare Q0600   carvedilol   3.125 mg Oral BID   Chlorhexidine  Gluconate Cloth  6 each Topical Daily   DULoxetine   60 mg Oral QHS   [START ON 05/13/2023] Evolocumab   140 mg Subcutaneous Q14 Days   fenofibrate   54 mg Oral QHS   heparin   5,000 Units Subcutaneous Q8H   hydrALAZINE   50 mg Oral TID   insulin  aspart  0-5 Units Subcutaneous QHS   insulin  aspart  0-9 Units Subcutaneous TID WC   insulin  aspart  30 Units Subcutaneous BID WC   insulin  aspart  40 Units Subcutaneous Q breakfast   insulin  glargine-yfgn  20 Units Subcutaneous QHS   ivabradine   7.5 mg Oral QHS   levothyroxine   137 mcg Oral Q0600   lipase/protease/amylase  36,000 Units Oral TID WC   norethindrone -ethinyl estradiol -iron  1 tablet Oral Daily   pantoprazole   40 mg Oral Daily   pregabalin   300 mg Oral BID   rosuvastatin   40 mg Oral Daily   sodium bicarbonate   1,300 mg Oral BID   sodium chloride  flush  3 mL Intravenous Q12H   tamsulosin   0.4 mg Oral Daily   topiramate   50 mg Oral BID   Continuous Infusions:  cefTRIAXone  (ROCEPHIN )  IV 1 g (05/06/23 2009)   PRN Meds:.acetaminophen  **OR** acetaminophen , butalbital -acetaminophen -caffeine , diphenhydrAMINE -zinc  acetate, hydrALAZINE , HYDROcodone -acetaminophen , HYDROmorphone  (DILAUDID ) injection, labetalol , mouth rinse, polyethylene glycol, polyvinyl alcohol , SUMAtriptan     Subjective:   Jennifer [name] was seen and examined today. Severe abd pain last night. Resolved now.  Objective:   Vitals:   05/06/23 2038 05/07/23 0538 05/07/23 0837 05/07/23 1549  BP: 127/82 127/77 129/83 115/71  Pulse: 97 100 100 97  Resp: 18 18 18 16   Temp: 98.7 F (37.1 C) 98.6 F (37 C) 98 F (36.7 C) 98.3 F (36.8 C)  TempSrc: Oral Oral Oral Oral  SpO2: 96% 96% 93% 95%    Intake/Output Summary (Last 24 hours) at 05/07/2023 1727 Last data filed at 05/07/2023 1556 Gross per 24 hour  Intake 2781.24 ml  Output 2850 ml  Net -68.76 ml   There were no vitals filed for this  visit.   Exam General exam: Appears calm and comfortable  Respiratory system: Clear to auscultation. Respiratory effort normal. Cardiovascular system: S1 & S2 heard, RRR. No JVD, Gastrointestinal system: Abdomen is nondistended, soft and non tender Central nervous system: Alert and oriented. Extremities: Symmetric 5 x 5 power. Skin: No rashes,  Psychiatry: Mood & affect appropriate.      Data Reviewed:  I have personally reviewed following labs and imaging studies  CBC Lab Results  Component Value Date   WBC 8.2 05/05/2023   RBC 3.80 (L) 05/05/2023   HGB 11.8 (L) 05/05/2023   HCT 35.4 (L) 05/05/2023   MCV 93.2 05/05/2023   MCH 31.1 05/05/2023   PLT 161 05/05/2023   MCHC 33.3 05/05/2023   RDW 15.4 05/05/2023   LYMPHSABS 2.4 04/19/2023   MONOABS 0.4 04/19/2023   EOSABS 0.1 04/19/2023   BASOSABS 0.0 04/19/2023     Last metabolic panel Lab Results  Component Value Date   NA 128 (L) 05/07/2023   K 3.5 05/07/2023   CL 90 (L) 05/07/2023   CO2 21 (L) 05/07/2023   BUN 66 (H) 05/07/2023   CREATININE 2.72 (H) 05/07/2023   GLUCOSE 265 (H) 05/07/2023   GFRNONAA 23 (L) 05/07/2023   GFRAA 68 08/22/2019   CALCIUM  8.0 (L) 05/07/2023   PHOS 5.2 (H) 05/07/2023   PROT 7.3 05/04/2023   ALBUMIN  3.9 05/07/2023   BILITOT 0.7 05/04/2023   ALKPHOS 71 05/04/2023   AST 55 (H) 05/04/2023   ALT 81 (H) 05/04/2023   ANIONGAP 17 (H) 05/07/2023    CBG (last 3)  Recent Labs    05/07/23 0718 05/07/23 1132 05/07/23 1621  GLUCAP 241* 124* 110*      Coagulation Profile: Recent Labs  Lab 05/05/23 0201  INR 1.1     Radiology Studies: US  Abdomen Limited Result Date: 05/06/2023 CLINICAL DATA:  Elevated liver enzymes EXAM: ULTRASOUND ABDOMEN LIMITED RIGHT UPPER QUADRANT COMPARISON:  CT abdomen pelvis 05/04/2023 FINDINGS: Gallbladder: Gallstones: None Sludge: None Gallbladder Wall: Within normal limits Pericholecystic fluid: None Sonographic Murphy's Sign: Negative per  technologist Common bile duct: Diameter: 4 mm Liver: Parenchymal echogenicity: Homogeneously increased Contours: Normal Lesions: None Portal vein: Patent.  Hepatopetal flow Other: None. IMPRESSION: Diffuse increased echogenicity of the hepatic parenchyma is a nonspecific indicator of hepatocellular dysfunction, most commonly steatosis. Electronically Signed   By: Aliene Lloyd M.D.   On: 05/06/2023 16:11       Elgie Butter M.D. Jennifer [name] 05/07/2023, 5:27 PM  Available via Epic secure chat 7am-7pm After 7 pm, please refer to night coverage provider listed on amion.

## 2023-05-08 ENCOUNTER — Inpatient Hospital Stay (HOSPITAL_COMMUNITY): Payer: Medicaid Other

## 2023-05-08 DIAGNOSIS — R109 Unspecified abdominal pain: Secondary | ICD-10-CM | POA: Diagnosis not present

## 2023-05-08 DIAGNOSIS — K760 Fatty (change of) liver, not elsewhere classified: Secondary | ICD-10-CM | POA: Diagnosis not present

## 2023-05-08 DIAGNOSIS — I10 Essential (primary) hypertension: Secondary | ICD-10-CM | POA: Diagnosis not present

## 2023-05-08 DIAGNOSIS — E875 Hyperkalemia: Secondary | ICD-10-CM | POA: Diagnosis not present

## 2023-05-08 DIAGNOSIS — R160 Hepatomegaly, not elsewhere classified: Secondary | ICD-10-CM | POA: Diagnosis not present

## 2023-05-08 DIAGNOSIS — Z85841 Personal history of malignant neoplasm of brain: Secondary | ICD-10-CM | POA: Diagnosis not present

## 2023-05-08 LAB — ALDOSTERONE: Aldosterone: 3.4 ng/dL (ref 0.0–30.0)

## 2023-05-08 LAB — GLUCOSE, CAPILLARY
Glucose-Capillary: 230 mg/dL — ABNORMAL HIGH (ref 70–99)
Glucose-Capillary: 195 mg/dL — ABNORMAL HIGH (ref 70–99)
Glucose-Capillary: 161 mg/dL — ABNORMAL HIGH (ref 70–99)
Glucose-Capillary: 277 mg/dL — ABNORMAL HIGH (ref 70–99)

## 2023-05-08 LAB — OSMOLALITY: Osmolality: 304 mosm/kg — ABNORMAL HIGH (ref 275–295)

## 2023-05-08 LAB — RENAL FUNCTION PANEL
Albumin: 4.1 g/dL (ref 3.5–5.0)
Anion gap: 17 — ABNORMAL HIGH (ref 5–15)
BUN: 65 mg/dL — ABNORMAL HIGH (ref 6–20)
CO2: 21 mmol/L — ABNORMAL LOW (ref 22–32)
Calcium: 8.3 mg/dL — ABNORMAL LOW (ref 8.9–10.3)
Chloride: 86 mmol/L — ABNORMAL LOW (ref 98–111)
Creatinine, Ser: 2.32 mg/dL — ABNORMAL HIGH (ref 0.44–1.00)
GFR, Estimated: 28 mL/min — ABNORMAL LOW (ref >60)
Glucose, Bld: 214 mg/dL — ABNORMAL HIGH (ref 70–99)
Phosphorus: 4.2 mg/dL (ref 2.5–4.6)
Potassium: 3.6 mmol/L (ref 3.5–5.1)
Sodium: 124 mmol/L — ABNORMAL LOW (ref 135–145)

## 2023-05-08 LAB — SODIUM, URINE, RANDOM: Sodium, Ur: 15 mmol/L

## 2023-05-08 LAB — OSMOLALITY, URINE: Osmolality, Ur: 233 mosm/kg — ABNORMAL LOW (ref 300–900)

## 2023-05-08 MED ORDER — INSULIN GLARGINE-YFGN 100 UNIT/ML ~~LOC~~ SOLN
24.0000 [IU] | Freq: Every day | SUBCUTANEOUS | Status: DC
  Administered 2023-05-08: 24 [IU] via SUBCUTANEOUS
  Filled 2023-05-08 (×2): qty 0.24

## 2023-05-08 MED ORDER — HYDROMORPHONE HCL 1 MG/ML IJ SOLN
0.5000 mg | INTRAMUSCULAR | Status: AC | PRN
  Administered 2023-05-08 – 2023-05-09 (×3): 0.5 mg via INTRAVENOUS
  Filled 2023-05-08 (×3): qty 0.5

## 2023-05-08 NOTE — Inpatient Diabetes Management (Signed)
 Inpatient Diabetes Program Recommendations  AACE/ADA: New Consensus Statement on Inpatient Glycemic Control   Target Ranges:  Prepandial:   less than 140 mg/dL      Peak postprandial:   less than 180 mg/dL (1-2 hours)      Critically ill patients:  140 - 180 mg/dL     Latest Reference Range & Units 05/07/23 07:18 05/07/23 11:32 05/07/23 16:21 05/07/23 19:09 05/07/23 20:54 05/08/23 08:07  Glucose-Capillary 70 - 99 mg/dL 161 (H) 096 (H) 045 (H) 113 (H) 142 (H) 230 (H)   Review of Glycemic Control  Diabetes history: DM2 Outpatient Diabetes medications: Humalog  40 units with breakfast, 30 units with lunch and dinner, Humulin  R U500 195 units BID (breakfast and lunch) Current orders for Inpatient glycemic control: Semglee  20 units at bedtime, Novolog  40 units with breakfast, Novolog  30 units with lunch and supper, Novolog  0-9 units TID with meals, Novolog  0-5 units QHS  Inpatient Diabetes Program Recommendations:    Insulin : Please consider increasing Semglee  to 24 units at bedtime.  Thanks, Beacher Limerick, RN, MSN, CDCES Diabetes Coordinator Inpatient Diabetes Program [phone] (Team Pager from 8am to 5pm)

## 2023-05-08 NOTE — Progress Notes (Signed)
 [facility]                                                                               Jennifer [name], is a 33 y.o. female, DOB - 12/27/1990, ZOX:[id number] Admit date - 05/04/2023    Outpatient Primary MD for the patient is Calton Catholic, PA-C  LOS - 3  days    Brief summary   Jennifer [name] is a 33 y.o. female with medical history significant of Hyperchylomicronemia, HFrEF, pancreatitis, astrocytoma, insulin -dependent type 2 diabetes with gastroparesis and peripheral neuropathy, hypertension, CKD 3B, pancreatic insufficiency patient's last hospitalization was in April 12, 2023 when she was discharged from [facility] for life-threatening hyperkalemia , was supposed to see nephrology on discharge, but didn't happen, was seen at doctors office today for OSA, was found to be hypertensive and was referred to [facility] for further evaluation.  Nephrology consulted for hyperkalemia   Assessment & Plan    Assessment and Plan: * Hyperkalemia Treated, repeat potassium is within normal limits Nephrology recommends to continue with Lokelma  10 g daily .  Intractable abdominal pain Unclear etiology, CT abdomen pelvis is unremarkable. ? Urinary retention? S/p foley catheter placeemnt.  She denies any nausea and vomiting .  Having bowel movements.  Check ct renal study as per nephrology.    Possible UTI;  UA shows moderate leukocytes and rare bacteria.  She does have a h/o os ESBL klebsiella UTI.  She was started on ceftriaxone  and urine cultures show  group B strep. D/c rocephin  after today's dose.   Urinary retention:  In and out twice done and she had 550 ml on the last in and out catheterization.  So foley catheter was placed. Pt reports the lower abd pain has improved and she feels relieved.     History of astrocytoma of brain Chronic ptosis.  This is believed to be in remission  Pancreatitis C/w creon    Acute combined systolic and diastolic heart  failure (HCC) Appears Euvolemic  HTN (hypertension) Well controlled. Torsemide  held for AKI.   Hypothyroidism Continue with Synthroid    Hyponatremia:  Worsening of hyponatremia today from 130 to 128 to 124.  Urine osmo is 233, serum osmo is 304, check TSH, free t4, and free t3, am cortisol level and check urine sodium.     Type 2 diabetes mellitus with hyperlipidemia (HCC) CBG (last 3)  Recent Labs    05/07/23 2054 05/08/23 0807 05/08/23 1151  GLUCAP 142* 230* 195*   .  Hemoglobin A1c is 6.1%. continue with SSI.    Hyperlipidemia Continue with fenofibrate     Constipation Resolved. Continue with MiraLAX  and senna ,Colace.    Acute on Stage IIIb - IV CKD Looks like her baseline creatinine is between 1.7 to 2.4. worsened to 2. 7 , after which the torsemide  is held. Creatinine improved to 2.3 today.  Nephrology on board and appreciate recommendations.  Mildly elevated liver enzymes Intermittent abdominal pain.  US  abdomen of the RUQ,  unremarkable except for hepatic steatosis.  Recheck liver panel in one week.       Estimated body mass index is 31.16 kg/m as calculated from the following:   Height as  of an earlier encounter on 05/04/23: 4\' 9"  (1.448 m).   Weight as of an earlier encounter on 05/04/23: 65.3 kg.  Code Status: full code.  DVT Prophylaxis:  Place and maintain sequential compression device Start: 05/08/23 1107 heparin  injection 5,000 Units Start: 05/05/23 0600   Level of Care: Level of care: Med-Surg Family Communication: none at bedside.   Disposition Plan:     Remains inpatient appropriate:  IV antibiotoics for UTI.   Procedures:  none  Consultants:   Nephrology.   Antimicrobials:   Anti-infectives (From admission, onward)    Start     Dose/Rate Route Frequency Ordered Stop   05/04/23 2030  cefTRIAXone  (ROCEPHIN ) 1 g in sodium chloride  0.9 % 100 mL IVPB        1 g 200 mL/hr over 30 Minutes Intravenous Every 24 hours 05/04/23 2017           Medications  Scheduled Meds:  amitriptyline   10 mg Oral QHS   aspirin  EC  81 mg Oral Daily   azelastine   2 spray Each Nare Q0600   carvedilol   3.125 mg Oral BID   Chlorhexidine  Gluconate Cloth  6 each Topical Daily   DULoxetine   60 mg Oral QHS   [START ON 05/13/2023] Evolocumab   140 mg Subcutaneous Q14 Days   fenofibrate   54 mg Oral QHS   heparin   5,000 Units Subcutaneous Q8H   hydrALAZINE   50 mg Oral TID   insulin  aspart  0-5 Units Subcutaneous QHS   insulin  aspart  0-9 Units Subcutaneous TID WC   insulin  aspart  30 Units Subcutaneous BID WC   insulin  aspart  40 Units Subcutaneous Q breakfast   insulin  glargine-yfgn  24 Units Subcutaneous QHS   ivabradine   7.5 mg Oral QHS   levothyroxine   137 mcg Oral Q0600   lipase/protease/amylase  36,000 Units Oral TID WC   norethindrone -ethinyl estradiol -iron  1 tablet Oral Daily   pantoprazole   40 mg Oral Daily   pregabalin   300 mg Oral BID   rosuvastatin   40 mg Oral Daily   sodium bicarbonate   1,300 mg Oral BID   sodium chloride  flush  3 mL Intravenous Q12H   tamsulosin   0.4 mg Oral Daily   topiramate   50 mg Oral BID   Continuous Infusions:  cefTRIAXone  (ROCEPHIN )  IV 1 g (05/07/23 2049)   PRN Meds:.acetaminophen  **OR** acetaminophen , butalbital -acetaminophen -caffeine , hydrALAZINE , HYDROcodone -acetaminophen , labetalol , mouth rinse, polyethylene glycol, polyvinyl alcohol , SUMAtriptan     Subjective:   Jennifer [name] was seen and examined today. Intermittent left upper quadrant abdominal pain.   Objective:   Vitals:   05/07/23 1549 05/07/23 2051 05/08/23 0540 05/08/23 0810  BP: 115/71 134/85 (!) 140/82 (!) 142/68  Pulse: 97 (!) 103 (!) 104 98  Resp: 16 17 17 18   Temp: 98.3 F (36.8 C) 98.3 F (36.8 C) 98.6 F (37 C) 97.8 F (36.6 C)  TempSrc: Oral Oral Oral Oral  SpO2: 95% 95% 95% 97%    Intake/Output Summary (Last 24 hours) at 05/08/2023 1606 Last data filed at 05/08/2023 1300 Gross per 24 hour  Intake  1800 ml  Output 2775 ml  Net -975 ml   There were no vitals filed for this visit.   Exam General exam: Appears calm and comfortable  Respiratory system: Clear to auscultation. Respiratory effort normal. Cardiovascular system: S1 & S2 heard, RRR. Gastrointestinal system: Abdomen is nondistended, soft and nontender.  Central nervous system: Alert and oriented.  Extremities: no edema.  Skin: No rashes, Psychiatry:  Mood &  affect appropriate.       Data Reviewed:  I have personally reviewed following labs and imaging studies   CBC Lab Results  Component Value Date   WBC 8.2 05/05/2023   RBC 3.80 (L) 05/05/2023   HGB 11.8 (L) 05/05/2023   HCT 35.4 (L) 05/05/2023   MCV 93.2 05/05/2023   MCH 31.1 05/05/2023   PLT 161 05/05/2023   MCHC 33.3 05/05/2023   RDW 15.4 05/05/2023   LYMPHSABS 2.4 04/19/2023   MONOABS 0.4 04/19/2023   EOSABS 0.1 04/19/2023   BASOSABS 0.0 04/19/2023     Last metabolic panel Lab Results  Component Value Date   NA 124 (L) 05/08/2023   K 3.6 05/08/2023   CL 86 (L) 05/08/2023   CO2 21 (L) 05/08/2023   BUN 65 (H) 05/08/2023   CREATININE 2.32 (H) 05/08/2023   GLUCOSE 214 (H) 05/08/2023   GFRNONAA 28 (L) 05/08/2023   GFRAA 68 08/22/2019   CALCIUM  8.3 (L) 05/08/2023   PHOS 4.2 05/08/2023   PROT 7.3 05/04/2023   ALBUMIN  4.1 05/08/2023   BILITOT 0.7 05/04/2023   ALKPHOS 71 05/04/2023   AST 55 (H) 05/04/2023   ALT 81 (H) 05/04/2023   ANIONGAP 17 (H) 05/08/2023    CBG (last 3)  Recent Labs    05/07/23 2054 05/08/23 0807 05/08/23 1151  GLUCAP 142* 230* 195*      Coagulation Profile: Recent Labs  Lab 05/05/23 0201  INR 1.1     Radiology Studies: DG Abd Portable 1V Result Date: 05/07/2023 CLINICAL DATA:  Abdominal pain. EXAM: PORTABLE ABDOMEN - 1 VIEW COMPARISON:  CT abdomen pelvis dated 05/04/2023. FINDINGS: No bowel dilatation or evidence of obstruction. Stool noted throughout the colon. The liver appears enlarged. No free air.  No acute osseous pathology. IMPRESSION: Nonobstructive bowel gas pattern. Electronically Signed   By: Angus Bark M.D.   On: 05/07/2023 21:15       Feliciana Horn M.D. [facility] 05/08/2023, 4:06 PM  Available via Epic secure chat 7am-7pm After 7 pm, please refer to night coverage provider listed on amion.

## 2023-05-08 NOTE — TOC CM/SW Note (Addendum)
 Transition of Care [facility]) - Inpatient Brief Assessment   Patient Details  Name: Jennifer [name] MRN: [id number] Date of Birth: 08-Oct-1990  [facility]) CM/SW Contact:    Tom-Johnson, Angelique Ken, RN Phone Number: 05/08/2023, 4:22 PM   Clinical Narrative:  Patient presented to the ED from her PCP's office with Hypertension, Headache, Dizziness, and LLQ Abdominal pain. Admitted with Hyperkalemia, Nephrology following.  Patient was recently discharged from the hospital 04/12/23 with same Dx.   Patient has Hx of Hyperchylomicronemia, HFrEF, Pancreatitis, CKD 3B, Astrocytoma, DM type 2 with Gastroparesis and Peripheral Neuropathy.   On IV abx for UTI. Patient noted with Urinary Retention, Foley Cath placed after I&O attempts.   From home alone, both parents and two brothers supportive. Does not have children,  Not employed, not on disability. States parents Financially care for her. Does not have DME's at home.  PCP is Mendel Stain and uses [facility] on [address].   Patient not Medically ready for discharge.  CM will continue to follow as patient progresses with care towards discharge.       Transition of Care Asessment: Insurance and Status: Insurance coverage has been reviewed Patient has primary care physician: Yes Home environment has been reviewed: Yes Prior level of function:: Independent Prior/Current Home Services: No current home services Social Drivers of Health Review: SDOH reviewed no interventions necessary Readmission risk has been reviewed: Yes Transition of care needs: transition of care needs identified, TOC will continue to follow (Transportation- Cab)

## 2023-05-08 NOTE — Progress Notes (Signed)
 Patient ID: Jennifer [name], female   DOB: 1990/10/01, 33 y.o.   MRN: [id number] [facility] Progress Note   Assessment/ Plan:   1.  Hyperkalemia: Appears acute on chronic and may indeed be associated with distal RTA (type IV) associated with diabetes mellitus.  Lokelma  discontinued yesterday with normalization of potassium levels. 2.  Chronic kidney disease stage IIIB-IV: Baseline creatinine 1.7-2.3 and in the process of transitioning care from [facility] nephrology to [place] [facility].  Renal biopsy showed chronic hypertensive/diabetic kidney disease with mild FSGS.  Decent urine output with some improvement of BUN/creatinine with low volume albumin  bolus.  Hold torsemide  for another 24 hours before restarting at a lower dose (20 mg rather than 40 mg that she was taking before). 3.  Hypertension: Blood pressure marginally elevated, continue current antihypertensive therapy. 4.  Hyponatremia: Earlier appeared to be associated with hyperglycemia but continues to progressively worsen.  Begin fluid restriction and check serum/urine osmolality. 5.  Urinary hesitancy: Status post Foley catheter placement and initiation of tamsulosin .  Discontinued antihistamines.  Subjective:   Complains of intermittent left lower quadrant pain shooting to her back.  Objective:   BP (!) 142/68 (BP Location: Right Arm)   Pulse 98   Temp 97.8 F (36.6 C) (Oral)   Resp 18   LMP 03/22/2023   SpO2 97%   Intake/Output Summary (Last 24 hours) at 05/08/2023 1008 Last data filed at 05/08/2023 0600 Gross per 24 hour  Intake 2621.24 ml  Output 2875 ml  Net -253.76 ml   Weight change:   Physical Exam: Gen: Comfortably resting in bed, describes pain out of proportion to how she looks. CVS: Pulse regular tachycardia, S1 and S2 normal Resp: Clear to auscultation bilaterally, no rales/rhonchi Abd: Soft, protuberant with tenderness over both lower quadrants/suprapubic area without obvious guarding. Ext: No  lower extremity edema  Imaging: DG Abd Portable 1V Result Date: 05/07/2023 CLINICAL DATA:  Abdominal pain. EXAM: PORTABLE ABDOMEN - 1 VIEW COMPARISON:  CT abdomen pelvis dated 05/04/2023. FINDINGS: No bowel dilatation or evidence of obstruction. Stool noted throughout the colon. The liver appears enlarged. No free air. No acute osseous pathology. IMPRESSION: Nonobstructive bowel gas pattern. Electronically Signed   By: Angus Bark M.D.   On: 05/07/2023 21:15   US  Abdomen Limited Result Date: 05/06/2023 CLINICAL DATA:  Elevated liver enzymes EXAM: ULTRASOUND ABDOMEN LIMITED RIGHT UPPER QUADRANT COMPARISON:  CT abdomen pelvis 05/04/2023 FINDINGS: Gallbladder: Gallstones: None Sludge: None Gallbladder Wall: Within normal limits Pericholecystic fluid: None Sonographic Murphy's Sign: Negative per technologist Common bile duct: Diameter: 4 mm Liver: Parenchymal echogenicity: Homogeneously increased Contours: Normal Lesions: None Portal vein: Patent.  Hepatopetal flow Other: None. IMPRESSION: Diffuse increased echogenicity of the hepatic parenchyma is a nonspecific indicator of hepatocellular dysfunction, most commonly steatosis. Electronically Signed   By: Elester Grim M.D.   On: 05/06/2023 16:11    Labs: BMET Recent Labs  Lab 05/04/23 1145 05/04/23 1302 05/04/23 1459 05/04/23 1640 05/05/23 0201 05/06/23 0502 05/07/23 0832 05/08/23 0405  NA 129* 131* 133* 134* 132* 130* 128* 124*  K 7.1* 7.7* 5.6* 4.8 4.5 4.0 3.5 3.6  CL 97* 110 110 103 98 95* 90* 86*  CO2 19*  --   --  19* 18* 19* 21* 21*  GLUCOSE 335* 355* 309* 259* 234* 338* 265* 214*  BUN 36* 50* 37* 37* 37* 58* 66* 65*  CREATININE 1.83* 2.10* 2.10* 1.92* 1.88* 2.40* 2.72* 2.32*  CALCIUM  9.3  --   --  9.3 8.9 8.2* 8.0*  8.3*  PHOS  --   --   --   --  4.9*  --  5.2* 4.2   CBC Recent Labs  Lab 05/04/23 1145 05/04/23 1302 05/04/23 1459 05/05/23 0201  WBC 8.6  --   --  8.2  HGB 14.6 14.6 12.9 11.8*  HCT 42.7 43.0 38.0 35.4*  MCV  92.2  --   --  93.2  PLT 180  --   --  161    Medications:     amitriptyline   10 mg Oral QHS   aspirin  EC  81 mg Oral Daily   azelastine   2 spray Each Nare Q0600   carvedilol   3.125 mg Oral BID   Chlorhexidine  Gluconate Cloth  6 each Topical Daily   DULoxetine   60 mg Oral QHS   [START ON 05/13/2023] Evolocumab   140 mg Subcutaneous Q14 Days   fenofibrate   54 mg Oral QHS   heparin   5,000 Units Subcutaneous Q8H   hydrALAZINE   50 mg Oral TID   insulin  aspart  0-5 Units Subcutaneous QHS   insulin  aspart  0-9 Units Subcutaneous TID WC   insulin  aspart  30 Units Subcutaneous BID WC   insulin  aspart  40 Units Subcutaneous Q breakfast   insulin  glargine-yfgn  20 Units Subcutaneous QHS   ivabradine   7.5 mg Oral QHS   levothyroxine   137 mcg Oral Q0600   lipase/protease/amylase  36,000 Units Oral TID WC   norethindrone -ethinyl estradiol -iron  1 tablet Oral Daily   pantoprazole   40 mg Oral Daily   pregabalin   300 mg Oral BID   rosuvastatin   40 mg Oral Daily   sodium bicarbonate   1,300 mg Oral BID   sodium chloride  flush  3 mL Intravenous Q12H   tamsulosin   0.4 mg Oral Daily   topiramate   50 mg Oral BID   Clevester Dally, MD 05/08/2023, 10:08 AM

## 2023-05-08 NOTE — Plan of Care (Addendum)
  Problem: Fluid Volume: Goal: Ability to maintain a balanced intake and output will improve Outcome: Progressing   Problem: Health Behavior/Discharge Planning: Goal: Ability to identify and utilize available resources and services will improve Outcome: Progressing   Problem: Metabolic: Goal: Ability to maintain appropriate glucose levels will improve Outcome: Progressing   Problem: Nutritional: Goal: Maintenance of adequate nutrition will improve Outcome: Progressing Goal: Progress toward achieving an optimal weight will improve Outcome: Progressing   Problem: Tissue Perfusion: Goal: Adequacy of tissue perfusion will improve Outcome: Progressing

## 2023-05-08 NOTE — Plan of Care (Deleted)
  Problem: Fluid Volume: Goal: Ability to maintain a balanced intake and output will improve Outcome: Progressing   Problem: Health Behavior/Discharge Planning: Goal: Ability to identify and utilize available resources and services will improve Outcome: Progressing   Problem: Metabolic: Goal: Ability to maintain appropriate glucose levels will improve Outcome: Progressing   Problem: Nutritional: Goal: Maintenance of adequate nutrition will improve Outcome: Progressing Goal: Progress toward achieving an optimal weight will improve Outcome: Progressing   Problem: Tissue Perfusion: Goal: Adequacy of tissue perfusion will improve Outcome: Progressing

## 2023-05-09 ENCOUNTER — Inpatient Hospital Stay (HOSPITAL_COMMUNITY): Payer: Medicaid Other

## 2023-05-09 DIAGNOSIS — E039 Hypothyroidism, unspecified: Secondary | ICD-10-CM | POA: Diagnosis not present

## 2023-05-09 DIAGNOSIS — I5030 Unspecified diastolic (congestive) heart failure: Secondary | ICD-10-CM | POA: Diagnosis not present

## 2023-05-09 DIAGNOSIS — E875 Hyperkalemia: Secondary | ICD-10-CM | POA: Diagnosis not present

## 2023-05-09 DIAGNOSIS — E785 Hyperlipidemia, unspecified: Secondary | ICD-10-CM | POA: Diagnosis not present

## 2023-05-09 DIAGNOSIS — N184 Chronic kidney disease, stage 4 (severe): Secondary | ICD-10-CM | POA: Diagnosis not present

## 2023-05-09 DIAGNOSIS — E8779 Other fluid overload: Secondary | ICD-10-CM | POA: Diagnosis not present

## 2023-05-09 DIAGNOSIS — I10 Essential (primary) hypertension: Secondary | ICD-10-CM | POA: Diagnosis not present

## 2023-05-09 DIAGNOSIS — I130 Hypertensive heart and chronic kidney disease with heart failure and stage 1 through stage 4 chronic kidney disease, or unspecified chronic kidney disease: Secondary | ICD-10-CM | POA: Diagnosis not present

## 2023-05-09 DIAGNOSIS — R109 Unspecified abdominal pain: Secondary | ICD-10-CM | POA: Diagnosis not present

## 2023-05-09 DIAGNOSIS — E1169 Type 2 diabetes mellitus with other specified complication: Secondary | ICD-10-CM | POA: Diagnosis not present

## 2023-05-09 DIAGNOSIS — Z85841 Personal history of malignant neoplasm of brain: Secondary | ICD-10-CM | POA: Diagnosis not present

## 2023-05-09 DIAGNOSIS — D649 Anemia, unspecified: Secondary | ICD-10-CM | POA: Diagnosis not present

## 2023-05-09 LAB — GLUCOSE, CAPILLARY
Glucose-Capillary: 294 mg/dL — ABNORMAL HIGH (ref 70–99)
Glucose-Capillary: 180 mg/dL — ABNORMAL HIGH (ref 70–99)
Glucose-Capillary: 167 mg/dL — ABNORMAL HIGH (ref 70–99)
Glucose-Capillary: 197 mg/dL — ABNORMAL HIGH (ref 70–99)

## 2023-05-09 LAB — T4, FREE: Free T4: 0.67 ng/dL (ref 0.61–1.12)

## 2023-05-09 LAB — RENAL FUNCTION PANEL
Albumin: 3.9 g/dL (ref 3.5–5.0)
Anion gap: 20 — ABNORMAL HIGH (ref 5–15)
BUN: 63 mg/dL — ABNORMAL HIGH (ref 6–20)
CO2: 21 mmol/L — ABNORMAL LOW (ref 22–32)
Calcium: 8.6 mg/dL — ABNORMAL LOW (ref 8.9–10.3)
Chloride: 86 mmol/L — ABNORMAL LOW (ref 98–111)
Creatinine, Ser: 2.63 mg/dL — ABNORMAL HIGH (ref 0.44–1.00)
GFR, Estimated: 24 mL/min — ABNORMAL LOW (ref >60)
Glucose, Bld: 353 mg/dL — ABNORMAL HIGH (ref 70–99)
Phosphorus: 4.3 mg/dL (ref 2.5–4.6)
Potassium: 4.2 mmol/L (ref 3.5–5.1)
Sodium: 127 mmol/L — ABNORMAL LOW (ref 135–145)

## 2023-05-09 LAB — TSH: TSH: 11.516 u[IU]/mL — ABNORMAL HIGH (ref 0.350–4.500)

## 2023-05-09 LAB — CORTISOL-AM, BLOOD: Cortisol - AM: 10.7 ug/dL (ref 6.7–22.6)

## 2023-05-09 IMAGING — CT CT ANGIO CHEST
2 of 7 series · 17 of 46 positions shown · IV contrast (omnipaque)
Comparison: Chest radiographs dated 03/09/2021

CLINICAL DATA: Chest pain, CHF exacerbation, elevated D-dimer

EXAM:
CT ANGIOGRAPHY CHEST WITH CONTRAST
TECHNIQUE: Multidetector CT imaging of the chest was performed using the
standard protocol during bolus administration of intravenous
contrast. Multiplanar CT image reconstructions and MIPs were
obtained to evaluate the vascular anatomy.
CONTRAST:  60mL OMNIPAQUE IOHEXOL 350 MG/ML SOLN

[Series 5: pe axial thins · axial · 0.74mm/px · z∈[+1513,+1717]mm · 14 of 236 slices shown]
[im 16/236  lung]
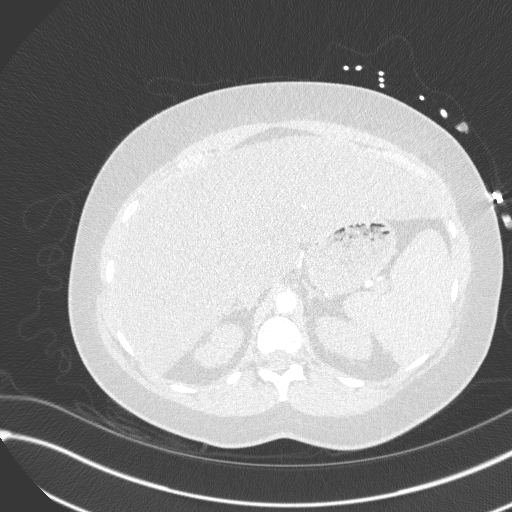
[im 32/236  soft-tissue]
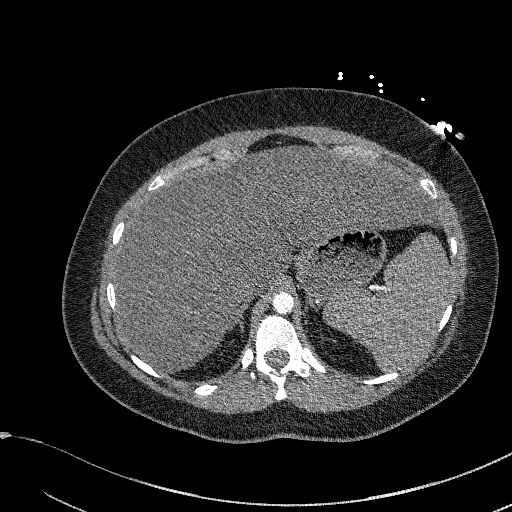
[im 48/236  lung]
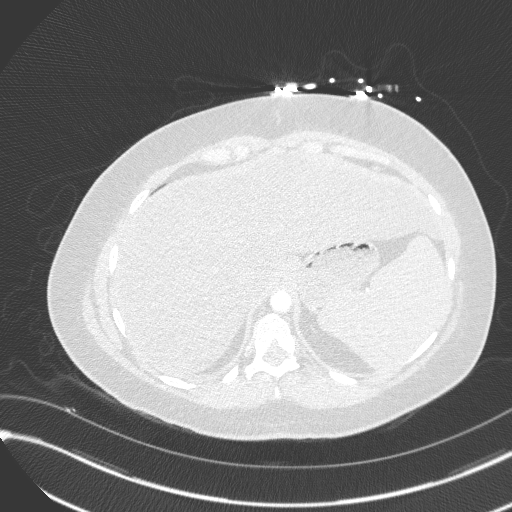
[im 63/236  soft-tissue]
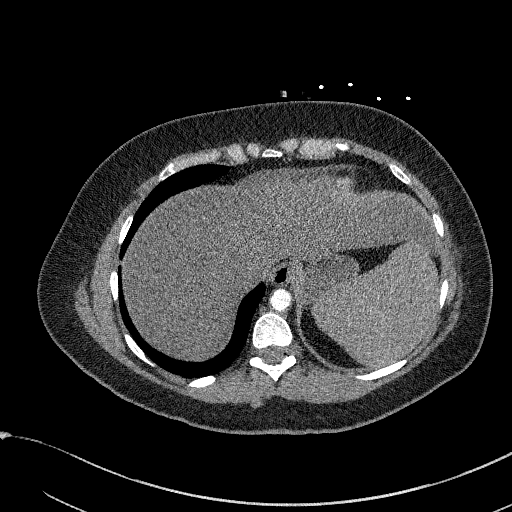
[im 79/236  lung]
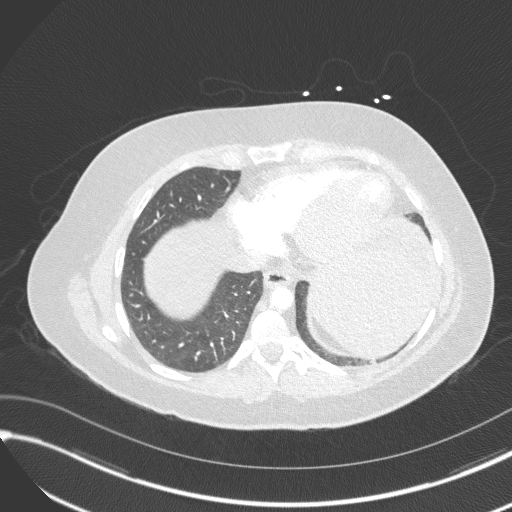
[im 95/236  soft-tissue]
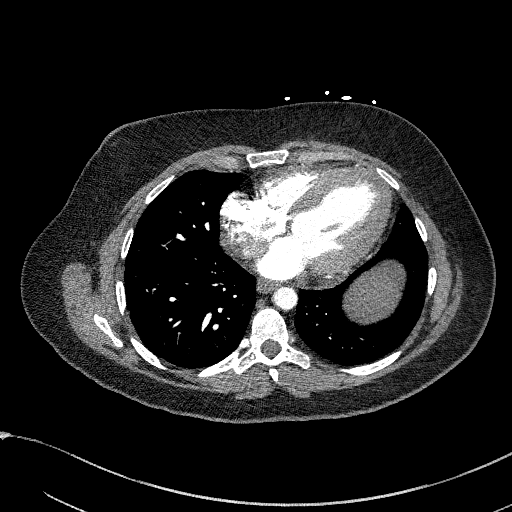
[im 110/236  lung]
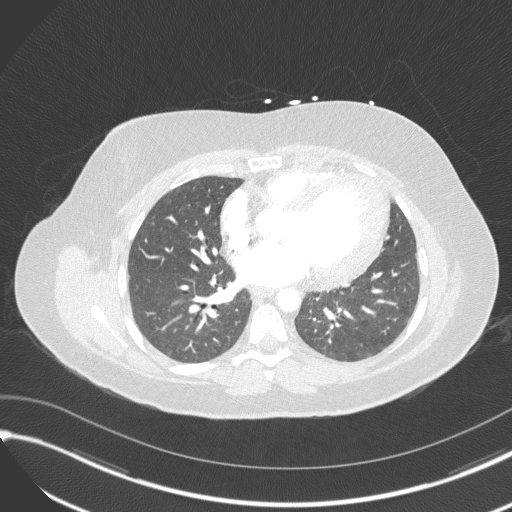
[im 126/236  soft-tissue]
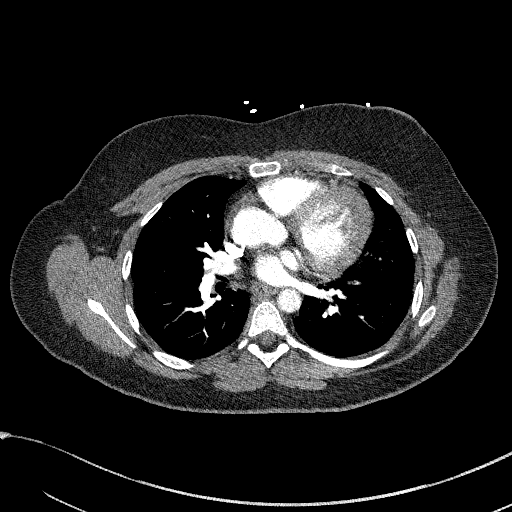
[im 142/236  lung]
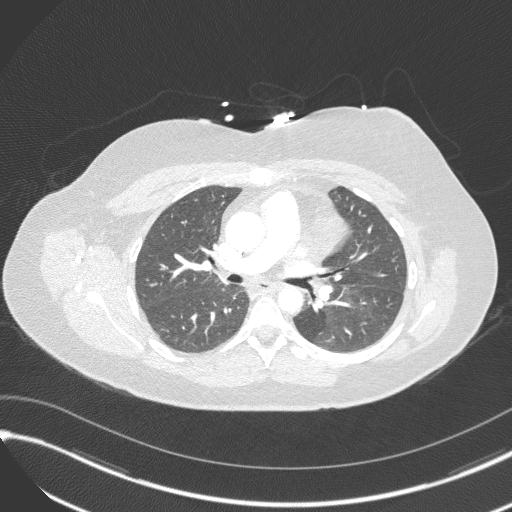
[im 157/236  soft-tissue]
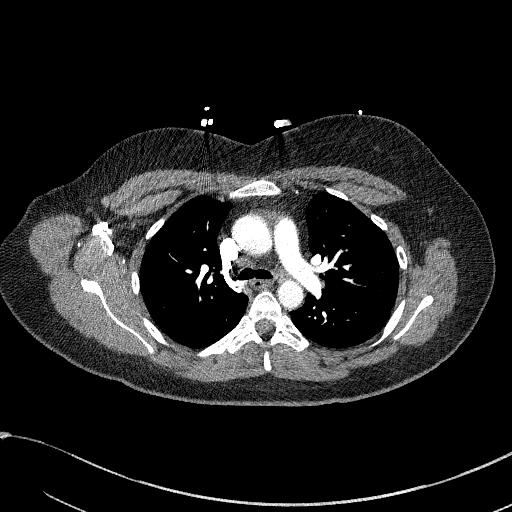
[im 173/236  lung]
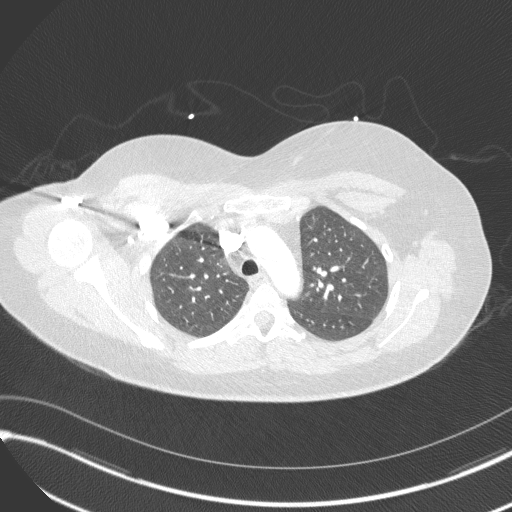
[im 189/236  soft-tissue]
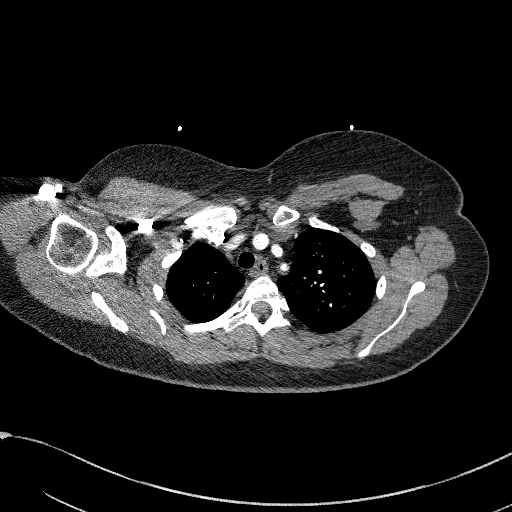
[im 204/236  lung]
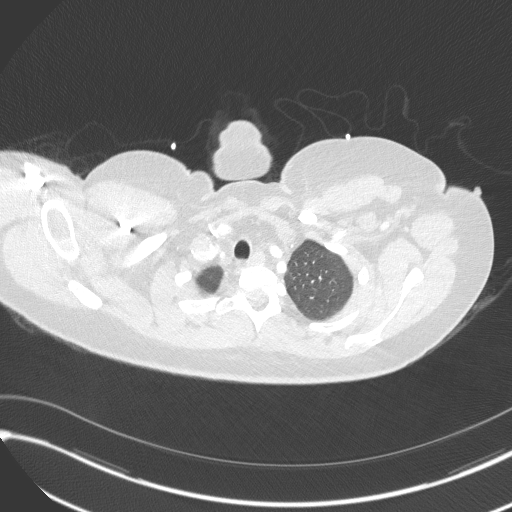
[im 220/236  soft-tissue]
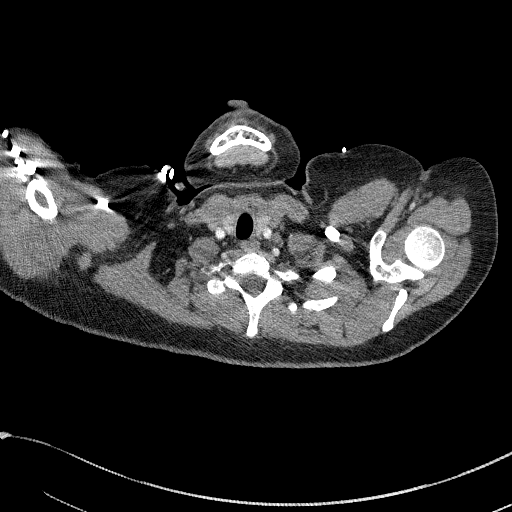

[Series 7: cor soft · coronal · 0.50mm/px · 3 of 103 slices shown]
[im 26/103  soft-tissue]
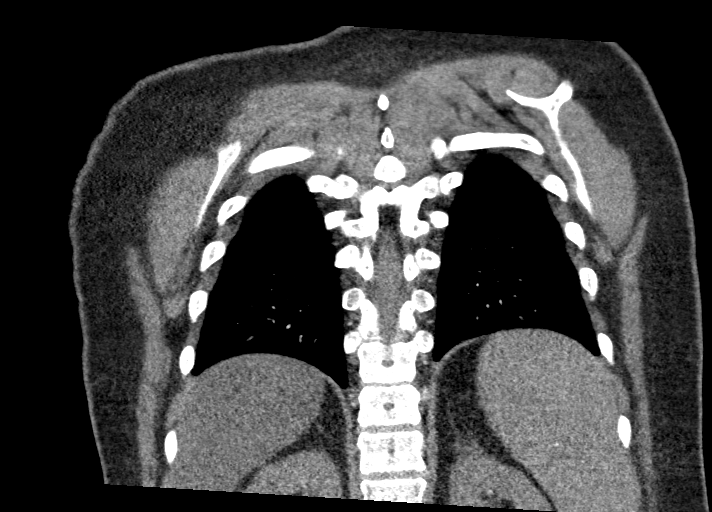
[im 52/103  soft-tissue]
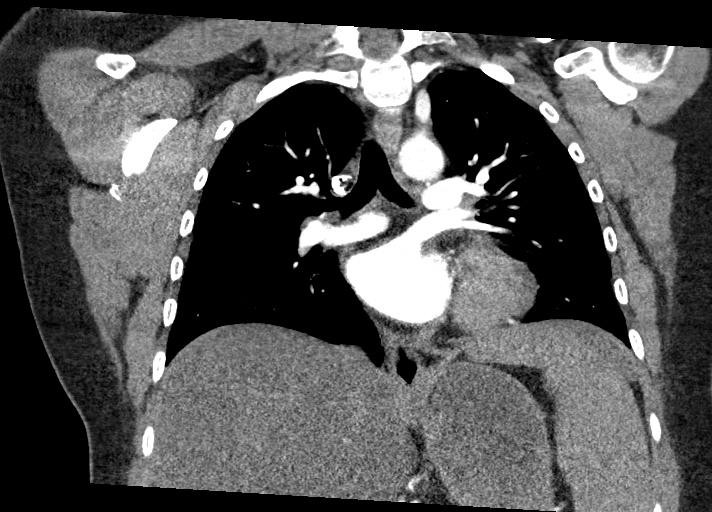
[im 77/103  soft-tissue]
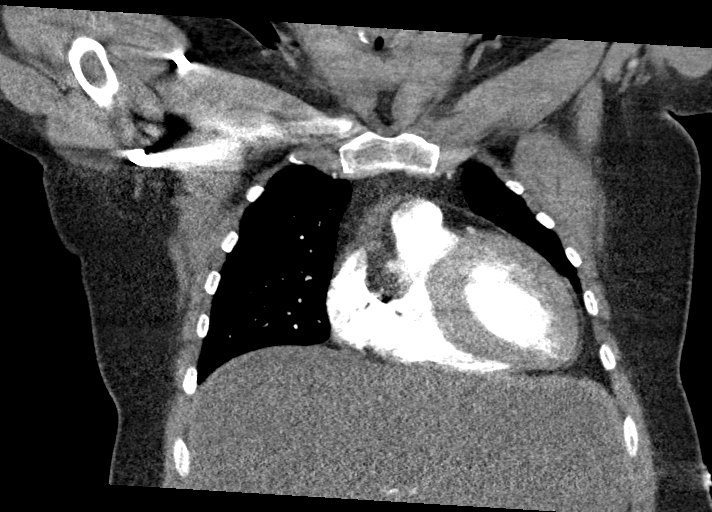

[17 of 46 positions shown; findings below may reference images not displayed]

FINDINGS: Cardiovascular: Satisfactory opacification of the bilateral
pulmonary arteries to the segmental level. No evidence of pulmonary
embolism.

Although not tailored for evaluation of the thoracic aorta, there is
no evidence of thoracic aortic aneurysm or dissection. Mild
atherosclerotic calcifications of the aortic arch.

The heart is normal in size.  Small inferior pericardial effusion.

Mediastinum/Nodes: No suspicious mediastinal lymphadenopathy.

Visualized thyroid is unremarkable.

Lungs/Pleura: Lungs are clear.

No suspicious pulmonary nodules.

No frank interstitial edema or focal consolidation.

No pleural effusion or pneumothorax.

Upper Abdomen: Visualized upper abdomen is notable for moderate
hepatic steatosis.

Musculoskeletal: Visualized osseous structures are within normal
limits.

Review of the MIP images confirms the above findings.
IMPRESSION: No evidence of pulmonary embolism.

Negative CT chest.

## 2023-05-09 IMAGING — DX DG CHEST 2V
2 series · 2 of 2 positions shown · non-contrast
Comparison: 02/08/2021

CLINICAL DATA: Chest pain

EXAM:
CHEST - 2 VIEW

[chest pa]
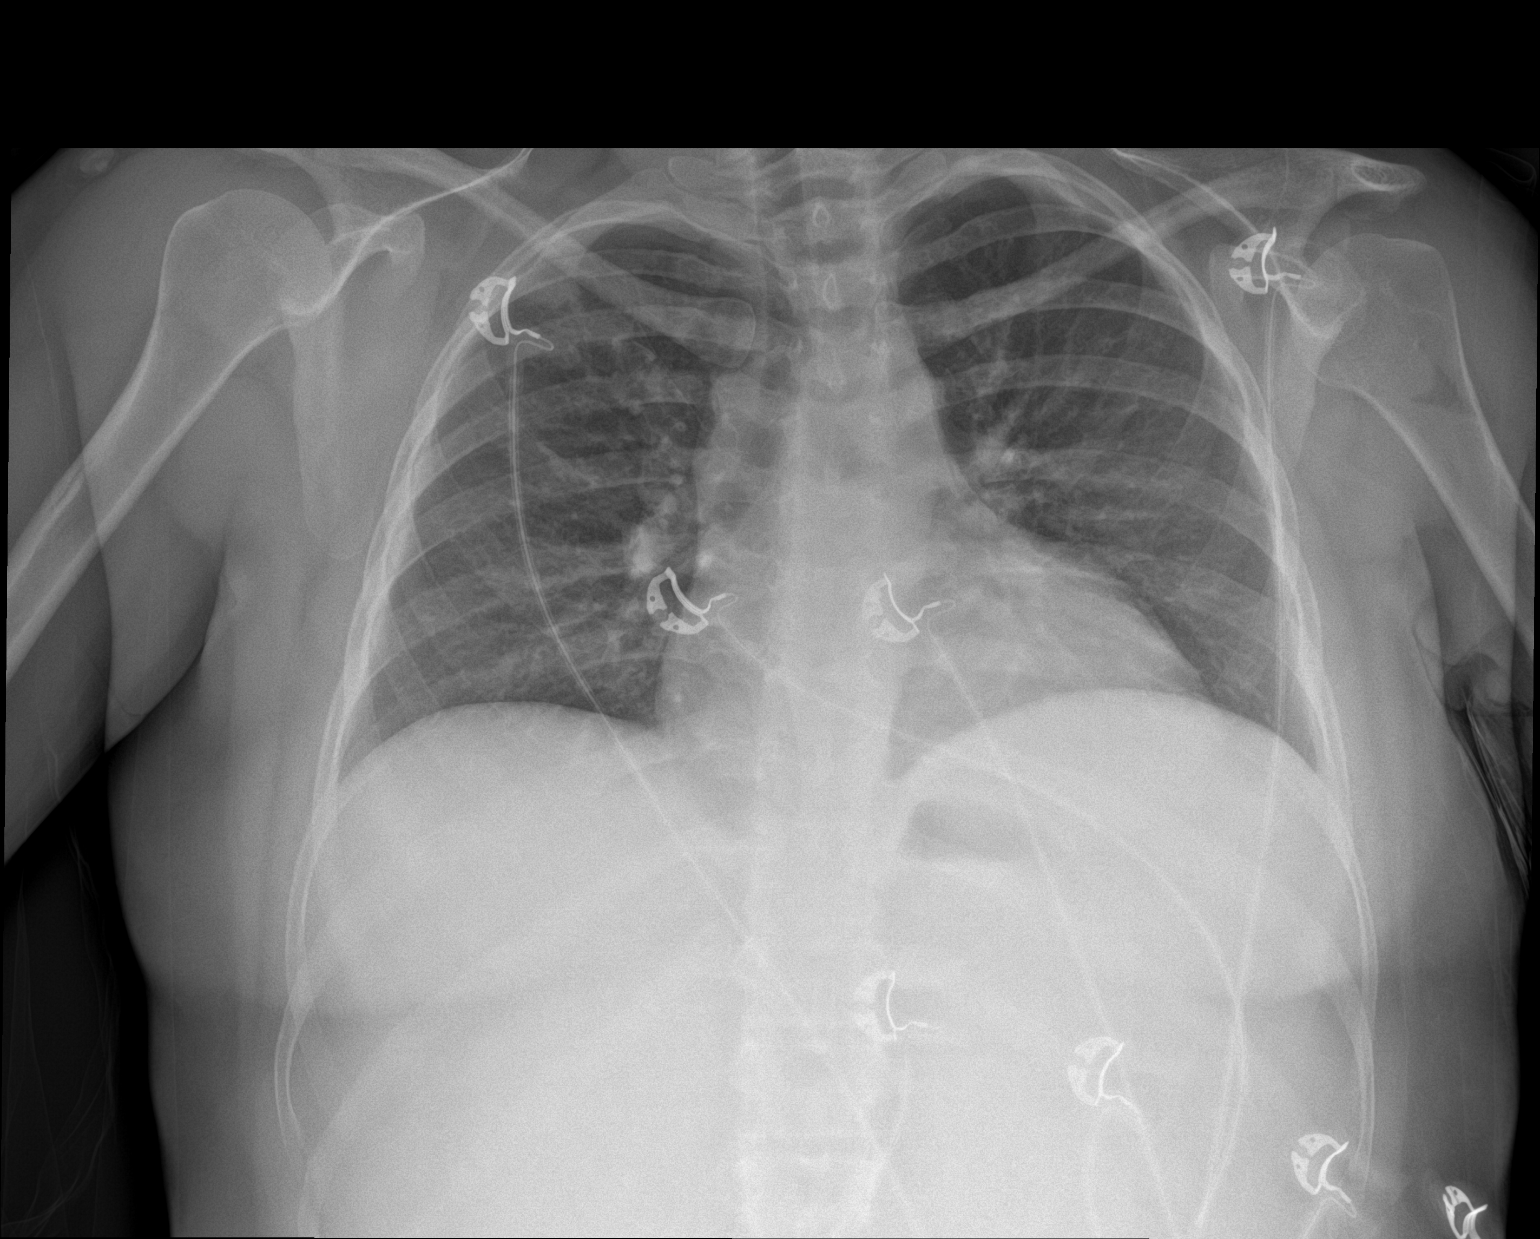

[chest lat]
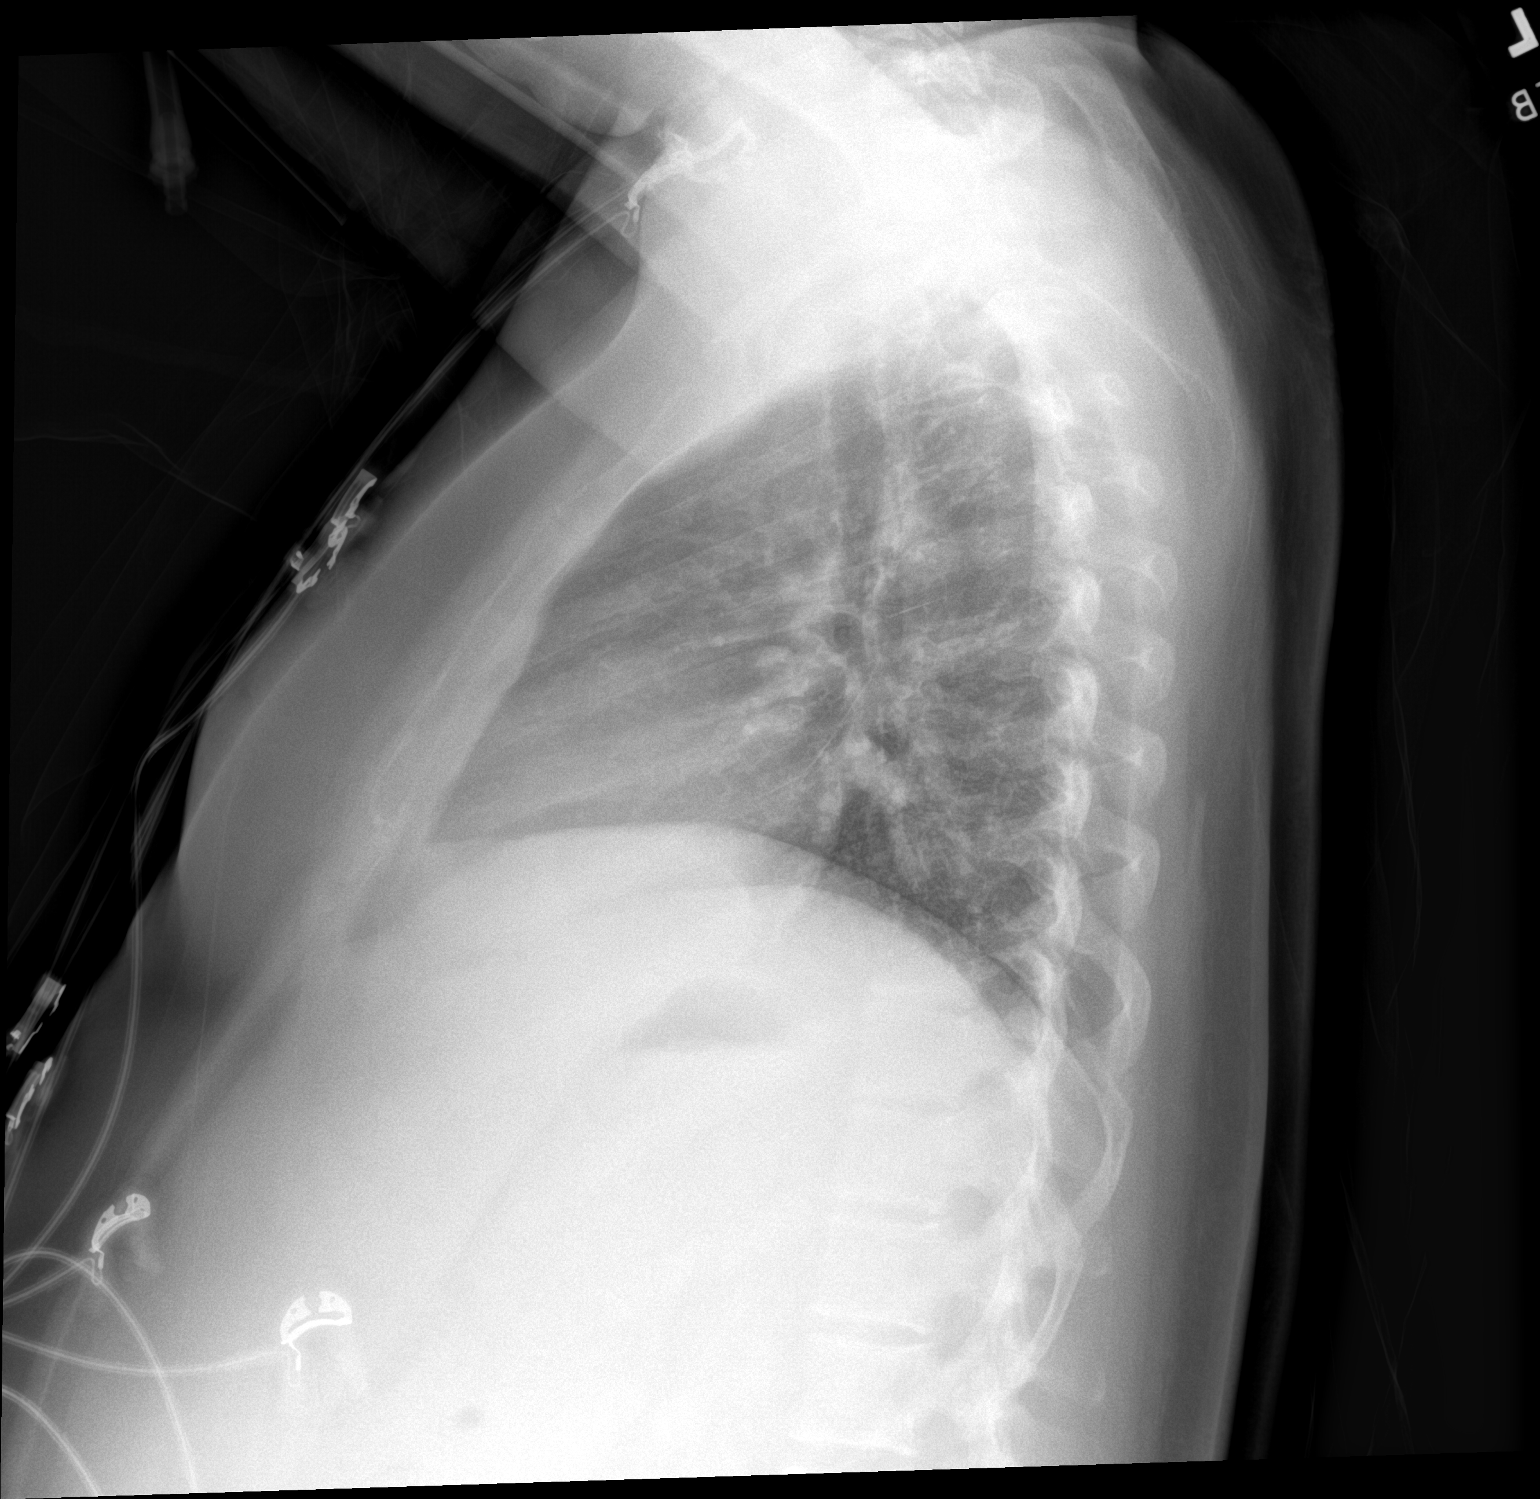

[2 of 2 positions shown; findings below may reference images not displayed]

FINDINGS: Heart and mediastinal contours are within normal limits. No focal
opacities or effusions. No acute bony abnormality.
IMPRESSION: No active cardiopulmonary disease.

## 2023-05-09 MED ORDER — INSULIN GLARGINE-YFGN 100 UNIT/ML ~~LOC~~ SOLN
30.0000 [IU] | Freq: Every day | SUBCUTANEOUS | Status: DC
  Administered 2023-05-09: 30 [IU] via SUBCUTANEOUS
  Filled 2023-05-09 (×2): qty 0.3

## 2023-05-09 MED ORDER — TORSEMIDE 20 MG PO TABS
20.0000 mg | ORAL_TABLET | Freq: Every day | ORAL | Status: DC
  Administered 2023-05-09 – 2023-05-10 (×2): 20 mg via ORAL
  Filled 2023-05-09 (×3): qty 1

## 2023-05-09 MED ORDER — METOCLOPRAMIDE HCL 5 MG/ML IJ SOLN
5.0000 mg | Freq: Four times a day (QID) | INTRAMUSCULAR | Status: DC
  Administered 2023-05-09 – 2023-05-10 (×3): 5 mg via INTRAVENOUS
  Filled 2023-05-09 (×3): qty 2

## 2023-05-09 MED ORDER — RIFAXIMIN 550 MG PO TABS
550.0000 mg | ORAL_TABLET | Freq: Three times a day (TID) | ORAL | Status: DC
  Administered 2023-05-09 – 2023-05-10 (×3): 550 mg via ORAL
  Filled 2023-05-09 (×3): qty 1

## 2023-05-09 NOTE — Progress Notes (Signed)
[facility]                                                                               Jennifer [name], is a 33 y.o. female, DOB - Feb 19, 1991, ZOX:[id number] Admit date - 05/04/2023    Outpatient Primary MD for the patient is Jennifer Canary, PA-C  LOS - 4  days    Brief summary   Jennifer [name] is a 33 y.o. female with medical history significant of Hyperchylomicronemia, HFrEF, pancreatitis, astrocytoma, insulin-dependent type 2 diabetes with gastroparesis and peripheral neuropathy, hypertension, CKD 3B, pancreatic insufficiency patient's last hospitalization was in April 12, 2023 when she was discharged from [facility] health for life-threatening hyperkalemia , was supposed to see nephrology on discharge, but didn't happen, was seen at doctors office today for OSA, was found to be hypertensive and was referred to [facility] for further evaluation.  Nephrology consulted for hyperkalemia. Hospital course significant for intermittent abdominal pain. She had extensive work up done at [facility].    Assessment & Plan    Assessment and Plan:  Hyperkalemia Treated, repeat potassium is within normal limits Nephrology recommends to continue with Lokelma 10 g daily .  Intractable abdominal pain Unclear etiology, CT abdomen pelvis is unremarkable. ? Urinary retention? S/p foley catheter placeemnt. CT renal study unremarkable.  She denies any nausea and vomiting .  Having bowel movements.  Differential include gastroparesis vs constipation. Trial of Reglan. GI consulted for further evaluation.     Possible UTI;  UA shows moderate leukocytes and rare bacteria.  She does have a h/o os ESBL klebsiella UTI.  Urine cultures grow group B strep. Completed the course of antibiotics.   Urinary retention:  S/p foley catheter placement, discussed with urology recommend outpatient follow up with urology for voiding trial.  Continue with flomax.     History of astrocytoma of  brain Chronic ptosis.  This is believed to be in remission  Chronic Pancreatitis C/w creon   Chronic combined systolic and diastolic heart failure (HCC) Appears Euvolemic  HTN (hypertension) Well controlled.   Hypothyroidism Continue with Synthroid .  Reviewed thyroid panel.    Hypervolemic hyponatremia: Sodium on admission 130 to 128 to 124 to 127.  Urine osmo is 233, serum osmo is 304,  Tsh elevated, free t4 wnl.  Cortisol is 10.7 and urine sodium is 15.  Continue with fluid restriction and strict intake and output.      Insulin dependent Diabetes mellitus with hyperlipidemia (HCC) CBG (last 3)  Recent Labs    05/08/23 2105 05/09/23 0727 05/09/23 1128  GLUCAP 277* 294* 180*   .  Hemoglobin A1c is 6.1%. continue with SSI.  Increase Semglee to 30 units and Novolog.    Hyperlipidemia Continue with fenofibrate    Constipation Resolved. Continue with MiraLAX and senna ,Colace.    Acute on Stage IIIb - IV CKD Looks like her baseline creatinine is between 1.7 to 2.4. Slight worsening of creatinine to 2.6 to 2.7. ? From urinary retention. Nephrology on board. Restarted torsemide   Mildly elevated liver enzymes Intermittent abdominal pain.  US abdomen of the RUQ,  unremarkable except for hepatic steatosis.  Recheck liver panel in one  week.       Estimated body mass index is 31.16 kg/m as calculated from the following:   Height as of an earlier encounter on 05/04/23: 4\' 9"  (1.448 m).   Weight as of an earlier encounter on 05/04/23: 65.3 kg.  Code Status: full code.  DVT Prophylaxis:  Place and maintain sequential compression device Start: 05/08/23 1107 heparin injection 5,000 Units Start: 05/05/23 0600   Level of Care: Level of care: Med-Surg Family Communication: none at bedside.   Disposition Plan:     Remains inpatient appropriate:  evaluation of abdominal pain.   Procedures:  none  Consultants:   Nephrology.   Antimicrobials:    Anti-infectives (From admission, onward)    Start     Dose/Rate Route Frequency Ordered Stop   05/04/23 2030  cefTRIAXone (ROCEPHIN) 1 g in sodium chloride 0.9 % 100 mL IVPB        1 g 200 mL/hr over 30 Minutes Intravenous Every 24 hours 05/04/23 2017          Medications  Scheduled Meds:  amitriptyline  10 mg Oral QHS   aspirin EC  81 mg Oral Daily   azelastine  2 spray Each Nare Q0600   carvedilol  3.125 mg Oral BID   Chlorhexidine Gluconate Cloth  6 each Topical Daily   DULoxetine  60 mg Oral QHS   [START ON 05/13/2023] Evolocumab  140 mg Subcutaneous Q14 Days   fenofibrate  54 mg Oral QHS   heparin  5,000 Units Subcutaneous Q8H   hydrALAZINE  50 mg Oral TID   insulin aspart  0-5 Units Subcutaneous QHS   insulin aspart  0-9 Units Subcutaneous TID WC   insulin aspart  30 Units Subcutaneous BID WC   insulin aspart  40 Units Subcutaneous Q breakfast   insulin glargine-yfgn  30 Units Subcutaneous QHS   ivabradine  7.5 mg Oral QHS   levothyroxine  137 mcg Oral Q0600   lipase/protease/amylase  36,000 Units Oral TID WC   metoCLOPramide (REGLAN) injection  5 mg Intravenous Q6H   norethindrone-ethinyl estradiol-iron  1 tablet Oral Daily   pantoprazole  40 mg Oral Daily   pregabalin  300 mg Oral BID   rosuvastatin  40 mg Oral Daily   sodium bicarbonate  1,300 mg Oral BID   sodium chloride flush  3 mL Intravenous Q12H   tamsulosin  0.4 mg Oral Daily   topiramate  50 mg Oral BID   torsemide  20 mg Oral Daily   Continuous Infusions:  cefTRIAXone (ROCEPHIN)  IV Stopped (05/08/23 2112)   PRN Meds:.acetaminophen **OR** acetaminophen, butalbital-acetaminophen-caffeine, hydrALAZINE, HYDROcodone-acetaminophen, labetalol, mouth rinse, polyethylene glycol, polyvinyl alcohol, SUMAtriptan    Subjective:   Jennifer [name] was seen and examined today. Intermittent left upper quadrant abdominal pain.   Objective:   Vitals:   05/08/23 1634 05/08/23 2057 05/09/23 0424 05/09/23  0858  BP: 109/60 (!) 112/53 136/84 (!) 149/85  Pulse: 88 89 84 81  Resp: 17 18 17 18   Temp: 97.7 F (36.5 C) 98.3 F (36.8 C) 98.4 F (36.9 C) 98.2 F (36.8 C)  TempSrc: Oral  Oral   SpO2: 96% 97% 96% 95%    Intake/Output Summary (Last 24 hours) at 05/09/2023 1317 Last data filed at 05/09/2023 1300 Gross per 24 hour  Intake 880 ml  Output 2900 ml  Net -2020 ml   There were no vitals filed for this visit.   Exam General exam: Appears calm and comfortable  Respiratory system: Clear  to auscultation. Respiratory effort normal. Cardiovascular system: S1 & S2 heard, RRR.  Gastrointestinal system: Abdomen is soft , mild tenderness in the left upper quadrant , bs+ Central nervous system: Alert and oriented. Extremities: Symmetric 5 x 5 power. Skin: No rashes,  Psychiatry: . Mood & affect appropriate.        Data Reviewed:  I have personally reviewed following labs and imaging studies   CBC Lab Results  Component Value Date   WBC 8.2 05/05/2023   RBC 3.80 (L) 05/05/2023   HGB 11.8 (L) 05/05/2023   HCT 35.4 (L) 05/05/2023   MCV 93.2 05/05/2023   MCH 31.1 05/05/2023   PLT 161 05/05/2023   MCHC 33.3 05/05/2023   RDW 15.4 05/05/2023   LYMPHSABS 2.4 04/19/2023   MONOABS 0.4 04/19/2023   EOSABS 0.1 04/19/2023   BASOSABS 0.0 04/19/2023     Last metabolic panel Lab Results  Component Value Date   NA 127 (L) 05/09/2023   K 4.2 05/09/2023   CL 86 (L) 05/09/2023   CO2 21 (L) 05/09/2023   BUN 63 (H) 05/09/2023   CREATININE 2.63 (H) 05/09/2023   GLUCOSE 353 (H) 05/09/2023   GFRNONAA 24 (L) 05/09/2023   GFRAA 68 08/22/2019   CALCIUM 8.6 (L) 05/09/2023   PHOS 4.3 05/09/2023   PROT 7.3 05/04/2023   ALBUMIN 3.9 05/09/2023   BILITOT 0.7 05/04/2023   ALKPHOS 71 05/04/2023   AST 55 (H) 05/04/2023   ALT 81 (H) 05/04/2023   ANIONGAP 20 (H) 05/09/2023    CBG (last 3)  Recent Labs    05/08/23 2105 05/09/23 0727 05/09/23 1128  GLUCAP 277* 294* 180*       Coagulation Profile: Recent Labs  Lab 05/05/23 0201  INR 1.1     Radiology Studies: CT RENAL STONE STUDY Result Date: 05/08/2023 CLINICAL DATA:  Flank pain EXAM: CT ABDOMEN AND PELVIS WITHOUT CONTRAST TECHNIQUE: Multidetector CT imaging of the abdomen and pelvis was performed following the standard protocol without IV contrast. RADIATION DOSE REDUCTION: This exam was performed according to the departmental dose-optimization program which includes automated exposure control, adjustment of the mA and/or kV according to patient size and/or use of iterative reconstruction technique. COMPARISON:  05/04/2023, 05/06/2023 FINDINGS: Lower chest: No acute pleural or parenchymal lung disease. Hepatobiliary: The liver is enlarged, with diffuse hepatic steatosis again noted. No biliary duct dilation. The gallbladder is unremarkable. Pancreas: Unremarkable unenhanced appearance. Spleen: Unremarkable unenhanced appearance. Adrenals/Urinary Tract: No urinary tract calculi or obstructive uropathy within either kidney. The adrenals are unremarkable. The bladder is decompressed with a Foley catheter in place. Stomach/Bowel: No bowel obstruction or ileus. Normal appendix right lower quadrant. No bowel wall thickening or inflammatory change. Vascular/Lymphatic: Aortic atherosclerosis. No enlarged abdominal or pelvic lymph nodes. Reproductive: Uterus and bilateral adnexa are unremarkable. Other: No free fluid or free intraperitoneal gas. No abdominal wall hernia. Musculoskeletal: No acute or destructive bony lesions. Sequela from multiple abdominal wall subcutaneous injections incidentally noted. Reconstructed images demonstrate no additional findings. IMPRESSION: 1. No evidence of urinary tract calculi or obstructive uropathy within either kidney. 2. Stable hepatomegaly and hepatic steatosis. 3.  Aortic Atherosclerosis (ICD10-I70.0). Electronically Signed   By: Sharlet Salina M.D.   On: 05/08/2023 18:14   DG Abd  Portable 1V Result Date: 05/07/2023 CLINICAL DATA:  Abdominal pain. EXAM: PORTABLE ABDOMEN - 1 VIEW COMPARISON:  CT abdomen pelvis dated 05/04/2023. FINDINGS: No bowel dilatation or evidence of obstruction. Stool noted throughout the colon. The liver appears enlarged. No free air.  No acute osseous pathology. IMPRESSION: Nonobstructive bowel gas pattern. Electronically Signed   By: Elgie Collard M.D.   On: 05/07/2023 21:15       Kathlen Mody M.D. [facility] 05/09/2023, 1:17 PM  Available via Epic secure chat 7am-7pm After 7 pm, please refer to night coverage provider listed on amion.

## 2023-05-09 NOTE — Inpatient Diabetes Management (Signed)
Inpatient Diabetes Program Recommendations  AACE/ADA: New Consensus Statement on Inpatient Glycemic Control (2015)  Target Ranges:  Prepandial:   less than 140 mg/dL      Peak postprandial:   less than 180 mg/dL (1-2 hours)      Critically ill patients:  140 - 180 mg/dL   Lab Results  Component Value Date   GLUCAP 294 (H) 05/09/2023   HGBA1C 6.1 (H) 05/06/2023    Review of Glycemic Control  Latest Reference Range & Units 05/08/23 08:07 05/08/23 11:51 05/08/23 16:32 05/08/23 21:05 05/09/23 07:27  Glucose-Capillary 70 - 99 mg/dL 161 (H) 096 (H) 045 (H) 277 (H) 294 (H)   Diabetes history: DM 2 Outpatient Diabetes medications: Humalog 40 units with breakfast, 30 units with lunch and dinner, Humulin R U500 195 units BID (breakfast and lunch)  Current orders for Inpatient glycemic control:  Semglee 24 units qhs Novolog 40 units with breakfast Novolog 30 units with lunch and supper Novolog 0-9 units TID + HS   Inpatient Diabetes Program Recommendations:    -   Increase Semglee to 35 units  Thanks,  Christena Deem RN, MSN, BC-ADM Inpatient Diabetes Coordinator Team Pager [phone] (8a-5p)

## 2023-05-09 NOTE — Progress Notes (Signed)
Patient ID: Jennifer [name], female   DOB: 09/28/1990, 33 y.o.   MRN: [id number] [facility] Progress Note   Assessment/ Plan:   1.  Hyperkalemia: Appears acute on chronic and may indeed be associated with distal RTA (type IV) associated with diabetes mellitus.  Lokelma discontinued yesterday with normalization of potassium levels. 2.  Chronic kidney disease stage IIIB-IV: Baseline creatinine 1.7-2.3 and in the process of transitioning care from [facility] nephrology to [facility].  Renal biopsy showed chronic hypertensive/diabetic kidney disease with mild FSGS.  Decent urine output with some improvement of BUN/creatinine with low volume albumin bolus.   - albumin 3.9 - start back torsemide 20 mg daily 3.  Hypertension: Blood pressure marginally elevated, continue current antihypertensive therapy. 4.  Hyponatremia: Earlier appeared to be associated with hyperglycemia but continues to progressively worsen.  Begin fluid restriction and check serum/urine osmolality. 5.  Urinary hesitancy: Status post Foley catheter placement and initiation of tamsulosin.  Discontinued antihistamines.  Subjective:   Complains of intermittent left lower quadrant pain shooting to her back.  Objective:   BP (!) 149/85 (BP Location: Right Arm)   Pulse 81   Temp 98.2 F (36.8 C)   Resp 18   LMP 03/22/2023   SpO2 95%   Intake/Output Summary (Last 24 hours) at 05/09/2023 1236 Last data filed at 05/09/2023 1134 Gross per 24 hour  Intake 520 ml  Output 3600 ml  Net -3080 ml   Weight change:   Physical Exam: Gen: Comfortably resting in bed, describes pain out of proportion to how she looks. CVS: Pulse regular tachycardia, S1 and S2 normal Resp: Clear to auscultation bilaterally, no rales/rhonchi Abd: Soft, protuberant with tenderness over both lower quadrants/suprapubic area without obvious guarding. Ext: No lower extremity edema  Imaging: CT RENAL STONE STUDY Result Date: 05/08/2023 CLINICAL  DATA:  Flank pain EXAM: CT ABDOMEN AND PELVIS WITHOUT CONTRAST TECHNIQUE: Multidetector CT imaging of the abdomen and pelvis was performed following the standard protocol without IV contrast. RADIATION DOSE REDUCTION: This exam was performed according to the departmental dose-optimization program which includes automated exposure control, adjustment of the mA and/or kV according to patient size and/or use of iterative reconstruction technique. COMPARISON:  05/04/2023, 05/06/2023 FINDINGS: Lower chest: No acute pleural or parenchymal lung disease. Hepatobiliary: The liver is enlarged, with diffuse hepatic steatosis again noted. No biliary duct dilation. The gallbladder is unremarkable. Pancreas: Unremarkable unenhanced appearance. Spleen: Unremarkable unenhanced appearance. Adrenals/Urinary Tract: No urinary tract calculi or obstructive uropathy within either kidney. The adrenals are unremarkable. The bladder is decompressed with a Foley catheter in place. Stomach/Bowel: No bowel obstruction or ileus. Normal appendix right lower quadrant. No bowel wall thickening or inflammatory change. Vascular/Lymphatic: Aortic atherosclerosis. No enlarged abdominal or pelvic lymph nodes. Reproductive: Uterus and bilateral adnexa are unremarkable. Other: No free fluid or free intraperitoneal gas. No abdominal wall hernia. Musculoskeletal: No acute or destructive bony lesions. Sequela from multiple abdominal wall subcutaneous injections incidentally noted. Reconstructed images demonstrate no additional findings. IMPRESSION: 1. No evidence of urinary tract calculi or obstructive uropathy within either kidney. 2. Stable hepatomegaly and hepatic steatosis. 3.  Aortic Atherosclerosis (ICD10-I70.0). Electronically Signed   By: Sharlet Salina M.D.   On: 05/08/2023 18:14   DG Abd Portable 1V Result Date: 05/07/2023 CLINICAL DATA:  Abdominal pain. EXAM: PORTABLE ABDOMEN - 1 VIEW COMPARISON:  CT abdomen pelvis dated 05/04/2023. FINDINGS:  No bowel dilatation or evidence of obstruction. Stool noted throughout the colon. The liver appears enlarged. No free air. No  acute osseous pathology. IMPRESSION: Nonobstructive bowel gas pattern. Electronically Signed   By: Elgie Collard M.D.   On: 05/07/2023 21:15    Labs: BMET Recent Labs  Lab 05/04/23 1145 05/04/23 1302 05/04/23 1459 05/04/23 1640 05/05/23 0201 05/06/23 0502 05/07/23 0832 05/08/23 0405 05/09/23 0415  NA 129*   < > 133* 134* 132* 130* 128* 124* 127*  K 7.1*   < > 5.6* 4.8 4.5 4.0 3.5 3.6 4.2  CL 97*   < > 110 103 98 95* 90* 86* 86*  CO2 19*  --   --  19* 18* 19* 21* 21* 21*  GLUCOSE 335*   < > 309* 259* 234* 338* 265* 214* 353*  BUN 36*   < > 37* 37* 37* 58* 66* 65* 63*  CREATININE 1.83*   < > 2.10* 1.92* 1.88* 2.40* 2.72* 2.32* 2.63*  CALCIUM 9.3  --   --  9.3 8.9 8.2* 8.0* 8.3* 8.6*  PHOS  --   --   --   --  4.9*  --  5.2* 4.2 4.3   < > = values in this interval not displayed.   CBC Recent Labs  Lab 05/04/23 1145 05/04/23 1302 05/04/23 1459 05/05/23 0201  WBC 8.6  --   --  8.2  HGB 14.6 14.6 12.9 11.8*  HCT 42.7 43.0 38.0 35.4*  MCV 92.2  --   --  93.2  PLT 180  --   --  161    Medications:     amitriptyline  10 mg Oral QHS   aspirin EC  81 mg Oral Daily   azelastine  2 spray Each Nare Q0600   carvedilol  3.125 mg Oral BID   Chlorhexidine Gluconate Cloth  6 each Topical Daily   DULoxetine  60 mg Oral QHS   [START ON 05/13/2023] Evolocumab  140 mg Subcutaneous Q14 Days   fenofibrate  54 mg Oral QHS   heparin  5,000 Units Subcutaneous Q8H   hydrALAZINE  50 mg Oral TID   insulin aspart  0-5 Units Subcutaneous QHS   insulin aspart  0-9 Units Subcutaneous TID WC   insulin aspart  30 Units Subcutaneous BID WC   insulin aspart  40 Units Subcutaneous Q breakfast   insulin glargine-yfgn  24 Units Subcutaneous QHS   ivabradine  7.5 mg Oral QHS   levothyroxine  137 mcg Oral Q0600   lipase/protease/amylase  36,000 Units Oral TID WC    norethindrone-ethinyl estradiol-iron  1 tablet Oral Daily   pantoprazole  40 mg Oral Daily   pregabalin  300 mg Oral BID   rosuvastatin  40 mg Oral Daily   sodium bicarbonate  1,300 mg Oral BID   sodium chloride flush  3 mL Intravenous Q12H   tamsulosin  0.4 mg Oral Daily   topiramate  50 mg Oral BID   torsemide  20 mg Oral Daily   Bufford Buttner MD 05/09/2023, 12:36 PM

## 2023-05-09 NOTE — Plan of Care (Signed)
  Problem: Education: Goal: Individualized Educational Video(s) Outcome: Progressing   Problem: Fluid Volume: Goal: Ability to maintain a balanced intake and output will improve Outcome: Progressing   Problem: Health Behavior/Discharge Planning: Goal: Ability to identify and utilize available resources and services will improve Outcome: Progressing Goal: Ability to manage health-related needs will improve Outcome: Progressing   Problem: Metabolic: Goal: Ability to maintain appropriate glucose levels will improve Outcome: Progressing   Problem: Nutritional: Goal: Maintenance of adequate nutrition will improve Outcome: Progressing Goal: Progress toward achieving an optimal weight will improve Outcome: Progressing   Problem: Skin Integrity: Goal: Risk for impaired skin integrity will decrease Outcome: Progressing   Problem: Tissue Perfusion: Goal: Adequacy of tissue perfusion will improve Outcome: Progressing   Problem: Education: Goal: Knowledge of General Education information will improve Description: Including pain rating scale, medication(s)/side effects and non-pharmacologic comfort measures Outcome: Progressing   Problem: Health Behavior/Discharge Planning: Goal: Ability to manage health-related needs will improve Outcome: Progressing   Problem: Clinical Measurements: Goal: Ability to maintain clinical measurements within normal limits will improve Outcome: Progressing Goal: Will remain free from infection Outcome: Progressing Goal: Diagnostic test results will improve Outcome: Progressing Goal: Respiratory complications will improve Outcome: Progressing Goal: Cardiovascular complication will be avoided Outcome: Progressing   Problem: Activity: Goal: Risk for activity intolerance will decrease Outcome: Progressing   Problem: Nutrition: Goal: Adequate nutrition will be maintained Outcome: Progressing   Problem: Coping: Goal: Level of anxiety will  decrease Outcome: Progressing   Problem: Elimination: Goal: Will not experience complications related to bowel motility Outcome: Progressing Goal: Will not experience complications related to urinary retention Outcome: Progressing   Problem: Pain Managment: Goal: General experience of comfort will improve and/or be controlled Outcome: Progressing   Problem: Safety: Goal: Ability to remain free from injury will improve Outcome: Progressing   Problem: Skin Integrity: Goal: Risk for impaired skin integrity will decrease Outcome: Progressing

## 2023-05-09 NOTE — Consult Note (Cosign Needed)
Consultation  Referring Provider:   [facility] Primary Care Physician:  Leonard Downing Primary Gastroenterologist:  Dr. Richrd Humbles        Reason for Consultation:     Abdominal pain DOA: 05/04/2023         Hospital Day: 6         HPI:   Jennifer [name] is a 33 y.o. 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  Of note patient has had hospital admission 1/13 severe life-threatening hyperkalemia and AKI on CKD.  Patient's had 3 more ER visits for hyperglycemia, shortness of breath.  Noted to have elevated LFTs 04/21/2023.,  Normal BNP and chest x-ray.  Presents to the ER from pulmonary doctor's office 2/6 after evaluation for OSA with significant hypertension blood pressure 199/120.  GI consulted 2/11 for intractable abdominal pain  Work up notable for  Sodium 127, glucose 353, osmolality 304 BUN 63, creatinine 2.63, potassium 4.2, anion gap 20, calcium 8.6, albumin 3.9 Cortisol normal 10.7 Thyroid 11.516 T40.67 Urine sodium 15 CT renal stone showed no urinary tract calculi or obstructive uropathy stable hepatomegaly and hepatic steatosis Triglycerides 04/05/2023 1609 Possible UTI with moderate leukocytosis and rare bacteria started on ceftriaxone, had urinary retention in and out twice done 550 mL.  No family was present at the time of my evaluation. Patient states she has never had abdominal discomfort like this before started since she has been in the hospital for her hypertension.  Its lower midline abdominal pain that radiates up to her right side and around to her back it is worse with any sort of food and worse with sitting up better with lying down.  She has no associated nausea or vomiting.  She has no associated change in bowel habits.  States she has to bowel movements a day formed stools  feels like a complete bowel movement while on Creon 1 capsule 3 times daily.  She denies any melena or hematochezia.  No rectal pain but she does complain of significant abdominal bloating.  She also complains of urinary retention has had a difficult time with urination.  Denies fevers or chills, shortness of breath or chest pain.  Has had slight cough.  Abnormal ED labs: Abnormal Labs Reviewed  URINE CULTURE - Abnormal; Notable for the following components:      Result Value   Culture   (*)    Value: >=100,000 COLONIES/mL GROUP B STREP(S.AGALACTIAE)ISOLATED TESTING AGAINST S. AGALACTIAE NOT ROUTINELY PERFORMED DUE TO PREDICTABILITY OF AMP/PEN/VAN SUSCEPTIBILITY. Performed at [facility], [address], [place], [place] [address]    All other components within normal limits  COMPREHENSIVE METABOLIC PANEL - Abnormal; Notable for the following components:   Sodium 129 (*)    Potassium 7.1 (*)    Chloride 97 (*)    CO2 19 (*)    Glucose, Bld 335 (*)    BUN 36 (*)    Creatinine, Ser 1.83 (*)    Total Protein 9.5 (*)    AST 88 (*)    ALT 98 (*)    Total Bilirubin 1.5 (*)    GFR, Estimated 37 (*)    All other components within normal limits  URINALYSIS, ROUTINE W REFLEX MICROSCOPIC - Abnormal; Notable for the following components:   Color, Urine STRAW (*)    Glucose, UA 50 (*)  Hgb urine dipstick SMALL (*)    Protein, ur 30 (*)    Leukocytes,Ua MODERATE (*)    Bacteria, UA RARE (*)    All other components within normal limits  COMPREHENSIVE METABOLIC PANEL - Abnormal; Notable for the following components:   Sodium 134 (*)    CO2 19 (*)    Glucose, Bld 259 (*)    BUN 37 (*)    Creatinine, Ser 1.92 (*)    AST 55 (*)    ALT 81 (*)    GFR, Estimated 35 (*)    All other components within normal limits  RENAL FUNCTION PANEL - Abnormal; Notable for the following components:   Sodium 132 (*)    CO2 18 (*)    Glucose, Bld 234 (*)    BUN 37 (*)    Creatinine, Ser 1.88 (*)     Phosphorus 4.9 (*)    GFR, Estimated 36 (*)    Anion gap 16 (*)    All other components within normal limits  CBC - Abnormal; Notable for the following components:   RBC 3.80 (*)    Hemoglobin 11.8 (*)    HCT 35.4 (*)    All other components within normal limits  GLUCOSE, CAPILLARY - Abnormal; Notable for the following components:   Glucose-Capillary 241 (*)    All other components within normal limits  GLUCOSE, CAPILLARY - Abnormal; Notable for the following components:   Glucose-Capillary 254 (*)    All other components within normal limits  GLUCOSE, CAPILLARY - Abnormal; Notable for the following components:   Glucose-Capillary 204 (*)    All other components within normal limits  GLUCOSE, CAPILLARY - Abnormal; Notable for the following components:   Glucose-Capillary 161 (*)    All other components within normal limits  BASIC METABOLIC PANEL - Abnormal; Notable for the following components:   Sodium 130 (*)    Chloride 95 (*)    CO2 19 (*)    Glucose, Bld 338 (*)    BUN 58 (*)    Creatinine, Ser 2.40 (*)    Calcium 8.2 (*)    GFR, Estimated 27 (*)    Anion gap 16 (*)    All other components within normal limits  HEMOGLOBIN A1C - Abnormal; Notable for the following components:   Hgb A1c MFr Bld 6.1 (*)    All other components within normal limits  GLUCOSE, CAPILLARY - Abnormal; Notable for the following components:   Glucose-Capillary 208 (*)    All other components within normal limits  GLUCOSE, CAPILLARY - Abnormal; Notable for the following components:   Glucose-Capillary 295 (*)    All other components within normal limits  GLUCOSE, CAPILLARY - Abnormal; Notable for the following components:   Glucose-Capillary 125 (*)    All other components within normal limits  GLUCOSE, CAPILLARY - Abnormal; Notable for the following components:   Glucose-Capillary 187 (*)    All other components within normal limits  GLUCOSE, CAPILLARY - Abnormal; Notable for the following  components:   Glucose-Capillary 317 (*)    All other components within normal limits  GLUCOSE, CAPILLARY - Abnormal; Notable for the following components:   Glucose-Capillary 241 (*)    All other components within normal limits  RENAL FUNCTION PANEL - Abnormal; Notable for the following components:   Sodium 128 (*)    Chloride 90 (*)    CO2 21 (*)    Glucose, Bld 265 (*)    BUN 66 (*)  Creatinine, Ser 2.72 (*)    Calcium 8.0 (*)    Phosphorus 5.2 (*)    GFR, Estimated 23 (*)    Anion gap 17 (*)    All other components within normal limits  GLUCOSE, CAPILLARY - Abnormal; Notable for the following components:   Glucose-Capillary 124 (*)    All other components within normal limits  GLUCOSE, CAPILLARY - Abnormal; Notable for the following components:   Glucose-Capillary 110 (*)    All other components within normal limits  RENAL FUNCTION PANEL - Abnormal; Notable for the following components:   Sodium 124 (*)    Chloride 86 (*)    CO2 21 (*)    Glucose, Bld 214 (*)    BUN 65 (*)    Creatinine, Ser 2.32 (*)    Calcium 8.3 (*)    GFR, Estimated 28 (*)    Anion gap 17 (*)    All other components within normal limits  GLUCOSE, CAPILLARY - Abnormal; Notable for the following components:   Glucose-Capillary 113 (*)    All other components within normal limits  GLUCOSE, CAPILLARY - Abnormal; Notable for the following components:   Glucose-Capillary 142 (*)    All other components within normal limits  GLUCOSE, CAPILLARY - Abnormal; Notable for the following components:   Glucose-Capillary 230 (*)    All other components within normal limits  OSMOLALITY, URINE - Abnormal; Notable for the following components:   Osmolality, Ur 233 (*)    All other components within normal limits  OSMOLALITY - Abnormal; Notable for the following components:   Osmolality 304 (*)    All other components within normal limits  GLUCOSE, CAPILLARY - Abnormal; Notable for the following components:    Glucose-Capillary 195 (*)    All other components within normal limits  GLUCOSE, CAPILLARY - Abnormal; Notable for the following components:   Glucose-Capillary 161 (*)    All other components within normal limits  RENAL FUNCTION PANEL - Abnormal; Notable for the following components:   Sodium 127 (*)    Chloride 86 (*)    CO2 21 (*)    Glucose, Bld 353 (*)    BUN 63 (*)    Creatinine, Ser 2.63 (*)    Calcium 8.6 (*)    GFR, Estimated 24 (*)    Anion gap 20 (*)    All other components within normal limits  TSH - Abnormal; Notable for the following components:   TSH 11.516 (*)    All other components within normal limits  GLUCOSE, CAPILLARY - Abnormal; Notable for the following components:   Glucose-Capillary 277 (*)    All other components within normal limits  GLUCOSE, CAPILLARY - Abnormal; Notable for the following components:   Glucose-Capillary 294 (*)    All other components within normal limits  GLUCOSE, CAPILLARY - Abnormal; Notable for the following components:   Glucose-Capillary 180 (*)    All other components within normal limits  CBG MONITORING, ED - Abnormal; Notable for the following components:   Glucose-Capillary 365 (*)    All other components within normal limits  I-STAT CG4 LACTIC ACID, ED - Abnormal; Notable for the following components:   Lactic Acid, Venous 2.4 (*)    All other components within normal limits  I-STAT CHEM 8, ED - Abnormal; Notable for the following components:   Sodium 131 (*)    Potassium 7.7 (*)    BUN 50 (*)    Creatinine, Ser 2.10 (*)    Glucose, Bld  355 (*)    Calcium, Ion 1.12 (*)    TCO2 20 (*)    All other components within normal limits  I-STAT CHEM 8, ED - Abnormal; Notable for the following components:   Sodium 133 (*)    Potassium 5.6 (*)    BUN 37 (*)    Creatinine, Ser 2.10 (*)    Glucose, Bld 309 (*)    Calcium, Ion 1.13 (*)    TCO2 19 (*)    All other components within normal limits  TROPONIN I (HIGH  SENSITIVITY) - Abnormal; Notable for the following components:   Troponin I (High Sensitivity) 25 (*)    All other components within normal limits    Past Medical History:  Diagnosis Date   Afib (HCC) 05/12/2021   Brain tumor (HCC) 03/29/1995   astrocytoma   CHF (congestive heart failure) (HCC)    Cholesterosis    CKD (chronic kidney disease) stage 4, GFR 15-29 ml/min (HCC) 05/13/2021   DM (diabetes mellitus) (HCC) 10/10/2018   Fatty liver    HTN (hypertension) 10/10/2018   Hypothyroidism 10/10/2018   Lipoprotein deficiency    Lung disease    longevity long term   Pancreatitis    Polycystic ovary syndrome     Surgical History:  She  has a past surgical history that includes Brain surgery; Ventriculostomy (03/28/1997); Eye muscle surgery (Right, 03/28/2014); Abdominal surgery; RIGHT/LEFT HEART CATH AND CORONARY ANGIOGRAPHY (N/A, 12/03/2020); RIGHT HEART CATH (N/A, 08/05/2021); PRESSURE SENSOR/CARDIOMEMS (N/A, 12/06/2021); and RIGHT HEART CATH (N/A, 12/06/2021). Family History:  Her family history includes Diabetes in her father and mother; Hyperlipidemia in her mother; Hypertension in her father and mother; Pancreatic cancer in her paternal aunt and paternal uncle; Thyroid disease in her mother. Social History:   reports that she has never smoked. She has never used smokeless tobacco. She reports that she does not drink alcohol and does not use drugs.  Prior to Admission medications   Medication Sig Start Date End Date Taking? Authorizing Provider  amitriptyline (ELAVIL) 10 MG tablet Take 10 mg by mouth at bedtime.   Yes [provider]  aspirin EC 81 MG EC tablet Take 1 tablet (81 mg total) by mouth daily. Swallow whole. 12/07/20  Yes [name], DO  azelastine (ASTELIN) 0.1 % nasal spray Place 2 sprays into both nostrils daily at 6 (six) AM.   Yes [provider]  BLISOVI FE 1.5/30 1.5-30 MG-MCG tablet Take 1 tablet by mouth daily.   Yes [provider]  butalbital-acetaminophen-caffeine (FIORICET) 50-325-40 MG tablet Take 1 tablet by mouth daily as needed for headache. 09/26/22  Yes [provider]  carvedilol (COREG) 3.125 MG tablet Take 1 tablet (3.125 mg total) by mouth 2 (two) times daily. 10/04/22  Yes [name], [name], MD  CRANBERRY CONCENTRATE PO Take 2 tablets by mouth 2 (two) times daily.   Yes [provider]  CREON 36000-114000 units CPEP capsule Take 36,000 Units by mouth 3 (three) times daily.   Yes [provider]  DULoxetine (CYMBALTA) 60 MG capsule Take 60 mg by mouth at bedtime. 09/26/22  Yes [provider]  ergocalciferol (VITAMIN D2) 1.25 MG (50000 UT) capsule Take 50,000 Units by mouth every 7 (seven) days. Every Sunday   Yes [provider]  Evolocumab (REPATHA) 140 MG/ML SOSY Inject 140 mg into the skin every 14 (fourteen) days. Saturdays   Yes [provider]  fenofibrate 54 MG tablet Take 1 tablet (54 mg total) by mouth  daily. Patient taking differently: Take 54 mg by mouth at bedtime. 12/01/22  Yes [name], [name], MD  hydrALAZINE (APRESOLINE) 25 MG tablet Take 25 mg by mouth 3 (three) times daily.   Yes [provider]  insulin lispro (HUMALOG) 100 UNIT/ML injection Inject 30-40 Units into the skin See admin instructions. Inject subcutaneously 40 units with breakfast, 30 units with lunch and 30 units with dinner   Yes [provider]  insulin regular human CONCENTRATED (HUMULIN R U-500 KWIKPEN) 500 UNIT/ML KwikPen Inject 185 Units into the skin 3 (three) times daily with meals. Patient taking differently: Inject 195 Units into the skin 2 (two) times daily with a meal. Inject subcutaneously 195 units with breakfast and lunch 09/20/22 01/09/24 Yes [name], MD  ivabradine (CORLANOR) 7.5 MG TABS tablet Take 1 tablet (7.5 mg total) by mouth 2 (two) times daily with a meal. Patient taking differently: Take 7.5 mg by mouth at bedtime. 01/31/23  Yes  Milford, Anderson Malta, FNP  levocetirizine (XYZAL) 5 MG tablet Take 5 mg by mouth daily. 03/05/22  Yes [provider]  levothyroxine (SYNTHROID) 137 MCG tablet Take 137 mcg by mouth daily. 04/12/23  Yes [provider]  pantoprazole (PROTONIX) 40 MG tablet Take 40 mg by mouth daily. 12/24/19  Yes [provider]  pregabalin (LYRICA) 300 MG capsule Take 300 mg by mouth 2 (two) times daily.   Yes [provider]  prochlorperazine (COMPAZINE) 10 MG tablet Take 10 mg by mouth at bedtime.   Yes [provider]  rosuvastatin (CRESTOR) 40 MG tablet Take 40 mg by mouth daily. 08/09/21  Yes [provider]  sodium bicarbonate 650 MG tablet Take 1,300 mg by mouth 2 (two) times daily.   Yes [provider]  SUMAtriptan (IMITREX) 25 MG tablet Take 25 mg by mouth every 2 (two) hours as needed. 01/30/23  Yes [provider]  topiramate (TOPAMAX) 50 MG tablet Take 50 mg by mouth 2 (two) times daily.   Yes [provider]  torsemide (DEMADEX) 20 MG tablet Take 2 tablets (40 mg total) by mouth daily. May take 1 additional tablet as needed Patient taking differently: Take 20-40 mg by mouth See admin instructions. Take 40mg  daily. May take 1 additional tablet as needed for swelling. 10/05/22  Yes [name], [name], MD  tretinoin (RETIN-A) 0.05 % cream Apply 1 application topically at bedtime. 01/17/21  Yes [provider]  hydrALAZINE (APRESOLINE) 100 MG tablet Take 1 tablet (100 mg total) by mouth 3 (three) times daily. 04/12/23   Leroy Sea, MD  Insulin Pen Needle 32G X 4 MM MISC 1 Device by Does not apply route QID. For use with insulin pens 02/13/22   [name], MD    Current Facility-Administered Medications  Medication Dose Route Frequency Provider Last Rate Last Admin   acetaminophen (TYLENOL) tablet 650 mg  650 mg Oral Q6H PRN Nolberto Hanlon, MD       Or   acetaminophen (TYLENOL) suppository 650 mg  650 mg Rectal  Q6H PRN Nolberto Hanlon, MD       amitriptyline (ELAVIL) tablet 10 mg  10 mg Oral QHS Nolberto Hanlon, MD   10 mg at 05/08/23 2106   aspirin EC tablet 81 mg  81 mg Oral Daily Nolberto Hanlon, MD   81 mg at 05/09/23 1040   azelastine (ASTELIN) 0.1 % nasal spray 2 spray  2 spray Each Nare [id number] Nolberto Hanlon, MD   2 spray at  05/09/23 1042   butalbital-acetaminophen-caffeine (FIORICET) 50-325-40 MG per tablet 1 tablet  1 tablet Oral Daily PRN Nolberto Hanlon, MD       carvedilol (COREG) tablet 3.125 mg  3.125 mg Oral BID Nolberto Hanlon, MD   3.125 mg at 05/09/23 1040   cefTRIAXone (ROCEPHIN) 1 g in sodium chloride 0.9 % 100 mL IVPB  1 g Intravenous Q24H Nolberto Hanlon, MD   Stopped at 05/08/23 2112   Chlorhexidine Gluconate Cloth 2 % PADS 6 each  6 each Topical Daily Kathlen Mody, MD   6 each at 05/09/23 1043   DULoxetine (CYMBALTA) DR capsule 60 mg  60 mg Oral Sherene Sires, MD   60 mg at 05/08/23 2102   [START ON 05/13/2023] Evolocumab SOSY 140 mg  140 mg Subcutaneous Q14 Days Nolberto Hanlon, MD       fenofibrate tablet 54 mg  54 mg Oral Sherene Sires, MD   54 mg at 05/08/23 2103   heparin injection 5,000 Units  5,000 Units Subcutaneous Tommie Raymond, MD   5,000 Units at 05/09/23 1352   hydrALAZINE (APRESOLINE) injection 10 mg  10 mg Intravenous Q4H PRN Nolberto Hanlon, MD       hydrALAZINE (APRESOLINE) tablet 50 mg  50 mg Oral TID Nolberto Hanlon, MD   50 mg at 05/09/23 1039   HYDROcodone-acetaminophen (NORCO/VICODIN) 5-325 MG per tablet 1 tablet  1 tablet Oral Q4H PRN Nolberto Hanlon, MD   1 tablet at 05/09/23 0800   insulin aspart (novoLOG) injection 0-5 Units  0-5 Units Subcutaneous QHS Nolberto Hanlon, MD   3 Units at 05/08/23 2115   insulin aspart (novoLOG) injection 0-9 Units  0-9 Units Subcutaneous TID WC Nolberto Hanlon, MD   2 Units at 05/09/23 1215   insulin aspart (novoLOG) injection 30 Units  30 Units Subcutaneous BID WC Nolberto Hanlon, MD   30 Units at 05/09/23 1257   insulin aspart (novoLOG) injection 40 Units  40 Units  Subcutaneous Q breakfast Nolberto Hanlon, MD   40 Units at 05/09/23 0756   insulin glargine-yfgn (SEMGLEE) injection 30 Units  30 Units Subcutaneous QHS Kathlen Mody, MD       ivabradine (CORLANOR) tablet 7.5 mg  7.5 mg Oral QHS Nolberto Hanlon, MD   7.5 mg at 05/08/23 2106   labetalol (NORMODYNE) injection 20 mg  20 mg Intravenous Q2H PRN Nolberto Hanlon, MD       levothyroxine (SYNTHROID) tablet 137 mcg  137 mcg Oral Q0600 Nolberto Hanlon, MD   137 mcg at 05/09/23 0528   lipase/protease/amylase (CREON) capsule 36,000 Units  36,000 Units Oral TID WC Kathlen Mody, MD   36,000 Units at 05/09/23 1352   metoCLOPramide (REGLAN) injection 5 mg  5 mg Intravenous Q6H Kathlen Mody, MD   5 mg at 05/09/23 1352   norethindrone-ethinyl estradiol-iron (LOESTRIN FE) 1.5-30 MG-MCG tablet 1 tablet  1 tablet Oral Daily Nolberto Hanlon, MD   1 tablet at 05/09/23 1041   Oral care mouth rinse  15 mL Mouth Rinse PRN Kathlen Mody, MD       pantoprazole (PROTONIX) EC tablet 40 mg  40 mg Oral Daily Nolberto Hanlon, MD   40 mg at 05/09/23 1039   polyethylene glycol (MIRALAX / GLYCOLAX) packet 17 g  17 g Oral Daily PRN Nolberto Hanlon, MD       polyvinyl alcohol (LIQUIFILM TEARS) 1.4 % ophthalmic solution 1 drop  1 drop Both Eyes PRN Kathlen Mody, MD   1 drop at 05/09/23 1042  pregabalin (LYRICA) capsule 300 mg  300 mg Oral BID Nolberto Hanlon, MD   300 mg at 05/09/23 1041   rosuvastatin (CRESTOR) tablet 40 mg  40 mg Oral Daily Nolberto Hanlon, MD   40 mg at 05/09/23 1041   sodium bicarbonate tablet 1,300 mg  1,300 mg Oral BID Nolberto Hanlon, MD   1,300 mg at 05/09/23 1038   sodium chloride flush (NS) 0.9 % injection 3 mL  3 mL Intravenous Q12H Nolberto Hanlon, MD   3 mL at 05/09/23 1042   SUMAtriptan (IMITREX) tablet 25 mg  25 mg Oral Q2H PRN Nolberto Hanlon, MD       tamsulosin [facility]) capsule 0.4 mg  0.4 mg Oral Daily Dagoberto Ligas, MD   0.4 mg at 05/09/23 1039   topiramate (TOPAMAX) tablet 50 mg  50 mg Oral BID Nolberto Hanlon, MD   50 mg at 05/09/23 1040    torsemide (DEMADEX) tablet 20 mg  20 mg Oral Daily Bufford Buttner, MD   20 mg at 05/09/23 1258    Allergies as of 05/04/2023 - Review Complete 05/04/2023  Allergen Reaction Noted   Icosapent ethyl (epa ethyl ester) (fish)  04/05/2023   Ketamine Other (See Comments) 11/22/2020   [place] Itching 02/08/2021   Morphine Itching, Rash, and Hives 12/24/2019   Penicillins Hives, Itching, and Rash 11/25/2017   Clarithromycin Other (See Comments) 07/29/2018   Erythromycin  12/01/2021   Fentanyl Nausea And Vomiting 01/25/2021   Penicillin v Hives, Itching, and Other (See Comments) 02/25/2019   Shellfish allergy Other (See Comments) 11/25/2017   Prednisone Rash 03/26/2014    Review of Systems:    Constitutional: No weight loss, fever, chills, weakness or fatigue HEENT: Eyes: No change in vision               Ears, Nose, Throat:  No change in hearing or congestion Skin: No rash or itching Cardiovascular: No chest pain, chest pressure or palpitations   Respiratory: No SOB or cough Gastrointestinal: See HPI and otherwise negative Genitourinary: No dysuria or change in urinary frequency Neurological: No headache, dizziness or syncope Musculoskeletal: No new muscle or joint pain Hematologic: No bleeding or bruising Psychiatric: No history of depression or anxiety     Physical Exam:  Vital signs in last 24 hours: Temp:  [97.7 F (36.5 C)-98.4 F (36.9 C)] 98.2 F (36.8 C) (02/11 0858) Pulse Rate:  [81-89] 81 (02/11 0858) Resp:  [17-18] 18 (02/11 0858) BP: (109-149)/(53-85) 149/85 (02/11 0858) SpO2:  [95 %-97 %] 95 % (02/11 0858) Last BM Date : 05/06/23 Last BM recorded by nurses in past 5 days Stool Type: Type 2 (Lump and sausage like) (05/08/2023 10:44 AM)  General:   Pleasant female in no acute distress Head:  Normocephalic and atraumatic. Eyes: sclerae anicteric,conjunctive pink, right eye ptosis Heart:  regular rate and rhythm, no murmurs or gallops Pulm: Clear anteriorly; no  wheezing Abdomen:  Soft, Obese AB, Sluggish bowel sounds. mild tenderness in the lower abdomen. Without guarding and Without rebound, No organomegaly appreciated. Extremities:  Without edema. Rectal: Normal external rectal exam, normal rectal tone, light brown large volume soft stool in the rectum without masses, negative Hemoccult Msk:  Symmetrical without gross deformities. Peripheral pulses intact.  Neurologic:  Alert and  oriented x4;  No focal deficits.  Skin:   Dry and intact without significant lesions or rashes. Psychiatric:  Cooperative. Normal mood and affect.  LAB RESULTS: No results for input(s): "WBC", "HGB", "HCT", "PLT" in the last  72 hours. BMET Recent Labs    05/07/23 0832 05/08/23 0405 05/09/23 0415  NA 128* 124* 127*  K 3.5 3.6 4.2  CL 90* 86* 86*  CO2 21* 21* 21*  GLUCOSE 265* 214* 353*  BUN 66* 65* 63*  CREATININE 2.72* 2.32* 2.63*  CALCIUM 8.0* 8.3* 8.6*   LFT Recent Labs    05/09/23 0415  ALBUMIN 3.9   PT/INR No results for input(s): "LABPROT", "INR" in the last 72 hours.  STUDIES: CT RENAL STONE STUDY Result Date: 05/08/2023 CLINICAL DATA:  Flank pain EXAM: CT ABDOMEN AND PELVIS WITHOUT CONTRAST TECHNIQUE: Multidetector CT imaging of the abdomen and pelvis was performed following the standard protocol without IV contrast. RADIATION DOSE REDUCTION: This exam was performed according to the departmental dose-optimization program which includes automated exposure control, adjustment of the mA and/or kV according to patient size and/or use of iterative reconstruction technique. COMPARISON:  05/04/2023, 05/06/2023 FINDINGS: Lower chest: No acute pleural or parenchymal lung disease. Hepatobiliary: The liver is enlarged, with diffuse hepatic steatosis again noted. No biliary duct dilation. The gallbladder is unremarkable. Pancreas: Unremarkable unenhanced appearance. Spleen: Unremarkable unenhanced appearance. Adrenals/Urinary Tract: No urinary tract calculi or  obstructive uropathy within either kidney. The adrenals are unremarkable. The bladder is decompressed with a Foley catheter in place. Stomach/Bowel: No bowel obstruction or ileus. Normal appendix right lower quadrant. No bowel wall thickening or inflammatory change. Vascular/Lymphatic: Aortic atherosclerosis. No enlarged abdominal or pelvic lymph nodes. Reproductive: Uterus and bilateral adnexa are unremarkable. Other: No free fluid or free intraperitoneal gas. No abdominal wall hernia. Musculoskeletal: No acute or destructive bony lesions. Sequela from multiple abdominal wall subcutaneous injections incidentally noted. Reconstructed images demonstrate no additional findings. IMPRESSION: 1. No evidence of urinary tract calculi or obstructive uropathy within either kidney. 2. Stable hepatomegaly and hepatic steatosis. 3.  Aortic Atherosclerosis (ICD10-I70.0). Electronically Signed   By: Sharlet Salina M.D.   On: 05/08/2023 18:14   DG Abd Portable 1V Result Date: 05/07/2023 CLINICAL DATA:  Abdominal pain. EXAM: PORTABLE ABDOMEN - 1 VIEW COMPARISON:  CT abdomen pelvis dated 05/04/2023. FINDINGS: No bowel dilatation or evidence of obstruction. Stool noted throughout the colon. The liver appears enlarged. No free air. No acute osseous pathology. IMPRESSION: Nonobstructive bowel gas pattern. Electronically Signed   By: Elgie Collard M.D.   On: 05/07/2023 21:15      Impression     Intractable abdominal pain  CT renal stone study  unremarkable Started since admission more lower abdomen left upper abdomen into her back. Has some bloating associated with it otherwise no fever, chills, nausea, vomiting.  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 Elevated left  ventricular end-diastolic pressure.   History of IDA Status post infusions with hematology 12/3 &1/6 Status post thought secondary to menorrhagia, CKD Negative Hemoccult at bedside, no hematochezia or melena May need to consider colonoscopy outpatient  Principal Problem:   Hyperkalemia Active Problems:   Type 2 diabetes mellitus with hyperlipidemia (HCC)   Hypothyroidism   Intractable abdominal pain   HTN (hypertension)  Acute combined systolic and diastolic heart failure (HCC)   History of astrocytoma of brain   Amenorrhea   Pancreatitis   Abdominal pain    LOS: 4 days     Plan   33 year old female with complicated GI history followed by [facility] initially presented to ER with hypertensive crisis and had intractable abdominal discomfort.  With some urinary retention. -CT without significant inflammation or cause for discomfort.  No rebound or peritoneal symptoms -Patient did have rather large amount of stool on examination we will get KUB be to evaluate stool burden -Patient has history of SIBO had good response to Xifaxan in the past has discomfort with bloating will restart Xifaxan -Continue Creon -Consider outpatient evaluation for elevated transaminitis -May consider outpatient colonoscopy for IDA/discomfort Further recommendations per Dr. Rhea Belton  Thank you for your kind consultation, we will continue to follow.   Doree Albee  05/09/2023, 2:36 PM

## 2023-05-10 ENCOUNTER — Other Ambulatory Visit (HOSPITAL_COMMUNITY): Payer: Self-pay

## 2023-05-10 ENCOUNTER — Emergency Department (HOSPITAL_BASED_OUTPATIENT_CLINIC_OR_DEPARTMENT_OTHER)
Admission: EM | Admit: 2023-05-10 | Discharge: 2023-05-10 | Disposition: A | Discharge status: Home / Self Care | Payer: Medicaid Other | Attending: Emergency Medicine | Admitting: Emergency Medicine

## 2023-05-10 ENCOUNTER — Emergency Department (HOSPITAL_BASED_OUTPATIENT_CLINIC_OR_DEPARTMENT_OTHER): Payer: Medicaid Other

## 2023-05-10 DIAGNOSIS — K3184 Gastroparesis: Secondary | ICD-10-CM | POA: Diagnosis not present

## 2023-05-10 DIAGNOSIS — Z85841 Personal history of malignant neoplasm of brain: Secondary | ICD-10-CM | POA: Diagnosis not present

## 2023-05-10 DIAGNOSIS — Z7982 Long term (current) use of aspirin: Secondary | ICD-10-CM | POA: Insufficient documentation

## 2023-05-10 DIAGNOSIS — M79672 Pain in left foot: Secondary | ICD-10-CM | POA: Diagnosis not present

## 2023-05-10 DIAGNOSIS — R1010 Upper abdominal pain, unspecified: Secondary | ICD-10-CM | POA: Diagnosis not present

## 2023-05-10 DIAGNOSIS — M79671 Pain in right foot: Secondary | ICD-10-CM | POA: Diagnosis not present

## 2023-05-10 DIAGNOSIS — E1143 Type 2 diabetes mellitus with diabetic autonomic (poly)neuropathy: Secondary | ICD-10-CM

## 2023-05-10 DIAGNOSIS — R109 Unspecified abdominal pain: Secondary | ICD-10-CM | POA: Insufficient documentation

## 2023-05-10 DIAGNOSIS — E875 Hyperkalemia: Secondary | ICD-10-CM | POA: Diagnosis not present

## 2023-05-10 LAB — COMPREHENSIVE METABOLIC PANEL
ALT: 46 U/L — ABNORMAL HIGH (ref 0–44)
AST: 37 U/L (ref 15–41)
Albumin: 4.5 g/dL (ref 3.5–5.0)
Alkaline Phosphatase: 102 U/L (ref 38–126)
Anion gap: 15 (ref 5–15)
BUN: 69 mg/dL — ABNORMAL HIGH (ref 6–20)
CO2: 24 mmol/L (ref 22–32)
Calcium: 9.6 mg/dL (ref 8.9–10.3)
Chloride: 93 mmol/L — ABNORMAL LOW (ref 98–111)
Creatinine, Ser: 2.92 mg/dL — ABNORMAL HIGH (ref 0.44–1.00)
GFR, Estimated: 21 mL/min — ABNORMAL LOW (ref >60)
Glucose, Bld: 469 mg/dL — ABNORMAL HIGH (ref 70–99)
Potassium: 4.6 mmol/L (ref 3.5–5.1)
Sodium: 132 mmol/L — ABNORMAL LOW (ref 135–145)
Total Bilirubin: 0.5 mg/dL (ref 0.0–1.2)
Total Protein: 8.3 g/dL — ABNORMAL HIGH (ref 6.5–8.1)

## 2023-05-10 LAB — CBC WITH DIFFERENTIAL/PLATELET
Abs Immature Granulocytes: 0.03 10*3/uL (ref 0.00–0.07)
Abs Immature Granulocytes: 0.04 10*3/uL (ref 0.00–0.07)
Basophils Absolute: 0.1 10*3/uL (ref 0.0–0.1)
Basophils Absolute: 0.1 10*3/uL (ref 0.0–0.1)
Basophils Relative: 1 %
Basophils Relative: 1 %
Eosinophils Absolute: 0.1 10*3/uL (ref 0.0–0.5)
Eosinophils Absolute: 0.1 10*3/uL (ref 0.0–0.5)
Eosinophils Relative: 1 %
Eosinophils Relative: 1 %
HCT: 27.2 % — ABNORMAL LOW (ref 36.0–46.0)
HCT: 30.3 % — ABNORMAL LOW (ref 36.0–46.0)
Hemoglobin: 9.3 g/dL — ABNORMAL LOW (ref 12.0–15.0)
Hemoglobin: 10.5 g/dL — ABNORMAL LOW (ref 12.0–15.0)
Immature Granulocytes: 1 %
Immature Granulocytes: 1 %
Lymphocytes Relative: 34 %
Lymphocytes Relative: 29 %
Lymphs Abs: 2.2 10*3/uL (ref 0.7–4.0)
Lymphs Abs: 1.8 10*3/uL (ref 0.7–4.0)
MCH: 31.5 pg (ref 26.0–34.0)
MCH: 32.7 pg (ref 26.0–34.0)
MCHC: 34.2 g/dL (ref 30.0–36.0)
MCHC: 34.7 g/dL (ref 30.0–36.0)
MCV: 92.2 fL (ref 80.0–100.0)
MCV: 94.4 fL (ref 80.0–100.0)
Monocytes Absolute: 0.5 10*3/uL (ref 0.1–1.0)
Monocytes Absolute: 0.4 10*3/uL (ref 0.1–1.0)
Monocytes Relative: 7 %
Monocytes Relative: 7 %
Neutro Abs: 3.6 10*3/uL (ref 1.7–7.7)
Neutro Abs: 3.8 10*3/uL (ref 1.7–7.7)
Neutrophils Relative %: 56 %
Neutrophils Relative %: 61 %
Platelets: 126 10*3/uL — ABNORMAL LOW (ref 150–400)
Platelets: 161 10*3/uL (ref 150–400)
RBC: 2.95 MIL/uL — ABNORMAL LOW (ref 3.87–5.11)
RBC: 3.21 MIL/uL — ABNORMAL LOW (ref 3.87–5.11)
RDW: 15.4 % (ref 11.5–15.5)
RDW: 15.6 % — ABNORMAL HIGH (ref 11.5–15.5)
WBC: 6.5 10*3/uL (ref 4.0–10.5)
WBC: 6.2 10*3/uL (ref 4.0–10.5)
nRBC: 0 % (ref 0.0–0.2)
nRBC: 0 % (ref 0.0–0.2)

## 2023-05-10 LAB — URINALYSIS, W/ REFLEX TO CULTURE (INFECTION SUSPECTED)
Bilirubin Urine: NEGATIVE
Glucose, UA: 100 mg/dL — AB
Ketones, ur: NEGATIVE mg/dL
Leukocytes,Ua: NEGATIVE
Nitrite: NEGATIVE
Specific Gravity, Urine: 1.009 (ref 1.005–1.030)
pH: 6.5 (ref 5.0–8.0)

## 2023-05-10 LAB — RENAL FUNCTION PANEL
Albumin: 3.8 g/dL (ref 3.5–5.0)
Anion gap: 16 — ABNORMAL HIGH (ref 5–15)
BUN: 66 mg/dL — ABNORMAL HIGH (ref 6–20)
CO2: 22 mmol/L (ref 22–32)
Calcium: 8.5 mg/dL — ABNORMAL LOW (ref 8.9–10.3)
Chloride: 91 mmol/L — ABNORMAL LOW (ref 98–111)
Creatinine, Ser: 2.57 mg/dL — ABNORMAL HIGH (ref 0.44–1.00)
GFR, Estimated: 25 mL/min — ABNORMAL LOW (ref >60)
Glucose, Bld: 341 mg/dL — ABNORMAL HIGH (ref 70–99)
Phosphorus: 4.1 mg/dL (ref 2.5–4.6)
Potassium: 4.1 mmol/L (ref 3.5–5.1)
Sodium: 129 mmol/L — ABNORMAL LOW (ref 135–145)

## 2023-05-10 LAB — LIPASE, BLOOD: Lipase: 57 U/L — ABNORMAL HIGH (ref 11–51)

## 2023-05-10 LAB — T3, FREE: T3, Free: 1.4 pg/mL — ABNORMAL LOW (ref 2.0–4.4)

## 2023-05-10 LAB — GLUCOSE, CAPILLARY: Glucose-Capillary: 306 mg/dL — ABNORMAL HIGH (ref 70–99)

## 2023-05-10 MED ORDER — RIFAXIMIN 550 MG PO TABS: 550.0000 mg | ORAL_TABLET | Freq: Three times a day (TID) | ORAL | 1 refills | Status: DC

## 2023-05-10 MED ORDER — CREON 36000-114000 UNITS PO CPEP: 36000.0000 [IU] | ORAL_CAPSULE | Freq: Three times a day (TID) | ORAL | 3 refills | Status: DC

## 2023-05-10 MED ORDER — POLYETHYLENE GLYCOL 3350 17 G PO PACK: 17.0000 g | PACK | Freq: Every day | ORAL | 0 refills | Status: DC | PRN

## 2023-05-10 MED ORDER — SODIUM BICARBONATE 650 MG PO TABS: 1300.0000 mg | ORAL_TABLET | Freq: Two times a day (BID) | ORAL | 3 refills | Status: DC

## 2023-05-10 MED ORDER — TAMSULOSIN HCL 0.4 MG PO CAPS: 0.4000 mg | ORAL_CAPSULE | Freq: Every day | ORAL | 0 refills | Status: DC

## 2023-05-10 MED ORDER — METOCLOPRAMIDE HCL 5 MG PO TABS: 5.0000 mg | ORAL_TABLET | Freq: Three times a day (TID) | ORAL | 0 refills | Status: DC | PRN

## 2023-05-10 MED ORDER — HYDRALAZINE HCL 50 MG PO TABS: 50.0000 mg | ORAL_TABLET | Freq: Three times a day (TID) | ORAL | 3 refills | Status: DC

## 2023-05-10 MED ORDER — SENNOSIDES-DOCUSATE SODIUM 8.6-50 MG PO TABS: 2.0000 | ORAL_TABLET | Freq: Every day | ORAL | 3 refills | Status: DC

## 2023-05-10 NOTE — ED Provider Triage Note (Signed)
Emergency Medicine Provider Triage Evaluation Note  Kody Vigil , a 33 y.o. female  was evaluated in triage.  Pt complains of suprapubic pain and feet pain.  Review of Systems  Positive:  Negative:   Physical Exam  LMP 03/22/2023  Gen:   Awake, no distress   Resp:  Normal effort  MSK:   Moves extremities without difficulty  Other:    Medical Decision Making  Medically screening exam initiated at 6:24 PM.  Appropriate orders placed.  Tobie Perdue was informed that the remainder of the evaluation will be completed by another provider, this initial triage assessment does not replace that evaluation, and the importance of remaining in the ED until their evaluation is complete.  Patient was dc'd from hospital today with indwelling foley catheter. Patient was not taught how to empty catheter. Patient is being shown by nursing how to empty catheter, but states that she still has lower abdominal pain. Also stating that she has swelling in BL feet.    Dorthy Cooler, [place] 05/10/23 [phone]

## 2023-05-10 NOTE — ED Notes (Signed)
Patient given education on emptying foley catheter bag.  Pt. Demonstrated and verbalized understanding.

## 2023-05-10 NOTE — Plan of Care (Signed)
  Problem: Education: Goal: Individualized Educational Video(s) Outcome: Progressing   Problem: Fluid Volume: Goal: Ability to maintain a balanced intake and output will improve Outcome: Progressing   Problem: Health Behavior/Discharge Planning: Goal: Ability to identify and utilize available resources and services will improve Outcome: Progressing Goal: Ability to manage health-related needs will improve Outcome: Progressing   Problem: Metabolic: Goal: Ability to maintain appropriate glucose levels will improve Outcome: Progressing   Problem: Nutritional: Goal: Maintenance of adequate nutrition will improve Outcome: Progressing Goal: Progress toward achieving an optimal weight will improve Outcome: Progressing   Problem: Skin Integrity: Goal: Risk for impaired skin integrity will decrease Outcome: Progressing   Problem: Tissue Perfusion: Goal: Adequacy of tissue perfusion will improve Outcome: Progressing   Problem: Education: Goal: Knowledge of General Education information will improve Description: Including pain rating scale, medication(s)/side effects and non-pharmacologic comfort measures Outcome: Progressing   Problem: Health Behavior/Discharge Planning: Goal: Ability to manage health-related needs will improve Outcome: Progressing   Problem: Clinical Measurements: Goal: Ability to maintain clinical measurements within normal limits will improve Outcome: Progressing Goal: Will remain free from infection Outcome: Progressing Goal: Diagnostic test results will improve Outcome: Progressing Goal: Respiratory complications will improve Outcome: Progressing Goal: Cardiovascular complication will be avoided Outcome: Progressing   Problem: Activity: Goal: Risk for activity intolerance will decrease Outcome: Progressing   Problem: Nutrition: Goal: Adequate nutrition will be maintained Outcome: Progressing   Problem: Coping: Goal: Level of anxiety will  decrease Outcome: Progressing   Problem: Elimination: Goal: Will not experience complications related to bowel motility Outcome: Progressing Goal: Will not experience complications related to urinary retention Outcome: Progressing   Problem: Pain Managment: Goal: General experience of comfort will improve and/or be controlled Outcome: Progressing   Problem: Safety: Goal: Ability to remain free from injury will improve Outcome: Progressing   Problem: Skin Integrity: Goal: Risk for impaired skin integrity will decrease Outcome: Progressing

## 2023-05-10 NOTE — TOC Transition Note (Signed)
Transition of Care [facility]) - Discharge Note   Patient Details  Name: Jennifer [name] MRN: [id number] Date of Birth: March 02, 1991  [facility] CM/SW Contact:  Tom-Johnson, Hershal Coria, RN Phone Number: 05/10/2023, 10:39 AM   Clinical Narrative:     Patient is scheduled for discharge today.  Readmission Risk Assessment done. Outpatient f/u, hospital f/u and discharge instructions on AVS. No TOC needs or recommendations noted. Family to transport at discharge.  No further TOC needs noted.         Final next level of care: Home/Self Care Barriers to Discharge: Barriers Resolved   Patient Goals and CMS Choice Patient states their goals for this hospitalization and ongoing recovery are:: To return home CMS Medicare.gov Compare Post Acute Care list provided to:: Patient Choice offered to / list presented to : NA      Discharge Placement                Patient to be transferred to facility by: Family      Discharge Plan and Services Additional resources added to the After Visit Summary for                  DME Arranged: N/A DME Agency: NA       HH Arranged: NA HH Agency: NA        Social Drivers of Health (SDOH) Interventions SDOH Screenings   Food Insecurity: No Food Insecurity (05/07/2023)  Housing: Low Risk  (05/07/2023)  Transportation Needs: No Transportation Needs (05/07/2023)  Utilities: Not At Risk (05/07/2023)  Alcohol Screen: Low Risk  (12/10/2021)  Depression (PHQ2-9): Low Risk  (12/10/2021)  Financial Resource Strain: Low Risk  (09/01/2022)   [facility], [facility]  Physical Activity: Inactive (12/01/2022)  Social Connections: Socially Integrated (09/01/2022)   Received from [facility], [facility]  Stress: No Stress Concern Present (09/01/2022)   Received from [facility], [facility]  Tobacco Use: Low Risk  (05/04/2023)     Readmission Risk Interventions    05/08/2023    4:21 PM 01/04/2023   11:45 AM  09/12/2022    5:44 PM  Readmission Risk Prevention Plan  Transportation Screening Complete Complete   Medication Review (RN Care Manager) Referral to Pharmacy Complete   PCP or Specialist appointment within 3-5 days of discharge Complete Complete   HRI or Home Care Consult Complete Complete   SW Recovery Care/Counseling Consult Complete Complete   Palliative Care Screening Not Applicable Not Applicable   Skilled Nursing Facility Not Applicable Not Applicable      Information is confidential and restricted. Go to Review Flowsheets to unlock data.

## 2023-05-10 NOTE — Discharge Instructions (Addendum)
Your labs today were normal for you.  Your x-rays were normal.  You may continue to change her Foley catheter bag as instructed by nursing staff.  Follow-up with your primary care doctor.  Take all of your medications as prescribed.  Return to the emergency room as needed for your health care.

## 2023-05-10 NOTE — ED Notes (Signed)
Report given to the next RN.Marland KitchenMarland Kitchen

## 2023-05-10 NOTE — Discharge Summary (Addendum)
Physician Discharge Summary   Patient: Jennifer [name] MRN: [id number] DOB: 11-01-1990  Admit date:     05/04/2023  Discharge date: 05/10/23  Discharge Physician: Thad Ranger, MD    PCP: Nathaneil Canary, PA-C   Recommendations at discharge:   Started on rifaximin 550 mg 3 times daily Continue Creon 36,000 units 3 times daily Recheck LFTs in 1 week  Discharge Diagnoses:    Hyperkalemia    Intractable abdominal pain  History of SIBO   UTI  Urinary retention   History of astrocytoma of brain   Type 2 diabetes mellitus with hyperlipidemia (HCC)   Hypothyroidism   HTN (hypertension) Chronic combined systolic and diastolic heart failure (HCC) Hypervolemic hyponatremia Acute on chronic CKD stage IIIb Mild transaminitis Constipation Hyperlipidemia Chronic pancreatitis Obesity class II  Hospital Course:  Patient is a 33 y.o. female with medical history significant of Hyperchylomicronemia, HFrEF, pancreatitis, astrocytoma, insulin-dependent type 2 diabetes with gastroparesis and peripheral neuropathy, hypertension, CKD 3B, pancreatic insufficiency patient's last hospitalization was in April 12, 2023 when she was discharged from [facility] for life-threatening hyperkalemia , was supposed to see nephrology on discharge, but didn't happen, was seen at doctors office today for OSA, was found to be hypertensive and was referred to [facility] for further evaluation.  Nephrology consulted for hyperkalemia. Hospital course significant for intermittent abdominal pain. She had extensive work up done at [facility].    Assessment and Plan:  Hyperkalemia -Presented with potassium of 7.7, nephrology was consulted, recommended Lokelma 10 g daily, received for 5 days -Potassium has been stable and in normal limits, 4.1 at discharge.   Intractable abdominal pain Unclear etiology, patient reported abdominal pain, worsens 1 to 2 hours after meals, no nausea.  CT abdomen pelvis is unremarkable, no bowel  obstruction or ileus, no bowel wall thickening or inflammatory changes.  -CT renal study unremarkable, no renal calculi or obstructive uropathy.  -GI was consulted,, could be element of gastroparesis in the setting of acute illness, no diverticulitis, recommended empiric SIBO treatment with rifaximin 550 mg 3 times daily for 14 days and follow-up outpatient with her [facility] provider Continue Creon  -She had some constipation, now resolved, has been having BMs.  Continue MiraLAX as needed, Senokot-S at bedtime   Possible UTI;  UA shows moderate leukocytes and rare bacteria.  She does have a h/o of ESBL klebsiella UTI.  Urine cultures grow group B strep. Completed the course of antibiotics.    Urinary retention:  S/p foley catheter placement, discussed with urology recommend outpatient follow up with urology for voiding trial.  Continue with flomax.   -Per patient, she has follow-up appointment with her urologist, Dr. Leanord Asal with [facility] on Monday, 05/15/2023      History of astrocytoma of brain Chronic ptosis.  This is believed to be in remission   Chronic Pancreatitis Continue Creon     Chronic combined systolic and diastolic heart failure (HCC) Appears Euvolemic, resume torsemide   HTN (hypertension) Well controlled.     Hypothyroidism Continue with Synthroid .  Reviewed thyroid panel.      Hypervolemic hyponatremia: Sodium on admission 130 to 128 to 124 to 127.  Urine osmolality is 233, serum osmo is 304,  Tsh elevated, free t4 wnl.  Cortisol is 10.7 and urine sodium is 15.  -Continue fluid restriction, sodium improving, 129  -Resume torsemide      Insulin dependent Diabetes mellitus with hyperlipidemia (HCC), type II CBG (last 3)    Hemoglobin A1c is 6.1%.  -  Continue outpatient insulin regimen     Hyperlipidemia Continue with fenofibrate      Constipation Resolved. Continue with MiraLAX and senna ,Colace.      Acute on Stage IIIb - IV  CKD Looks like her baseline creatinine is between 1.7 to 2.4. Slight worsening of creatinine to 2.6 to 2.7. ? From urinary retention. -Nephrology was consulted, creatinine now improving, 2.5 at discharge, close to baseline   Mildly elevated liver enzymes US abdomen of the RUQ,  unremarkable except for hepatic steatosis.  Recheck liver panel in one week.      Obesity class II  Estimated body mass index is 31.16 kg/m as calculated from the following:   Height as of an earlier encounter on 05/04/23: 4\' 9"  (1.448 m).   Weight as of an earlier encounter on 05/04/23: 65.3 kg.     Pain control - [employer] Controlled Substance Reporting System database was reviewed. and patient was instructed, not to drive, operate heavy machinery, perform activities at heights, swimming or participation in water activities or provide baby-sitting services while on Pain, Sleep and Anxiety Medications; until their outpatient Physician has advised to do so again. Also recommended to not to take more than prescribed Pain, Sleep and Anxiety Medications.  Consultants: Nephrology, GI Procedures performed: None Disposition: Home Diet recommendation:  Discharge Diet Orders (From admission, onward)     Start     Ordered   05/10/23 0000  Diet Carb Modified        05/10/23 0905            DISCHARGE MEDICATION: Allergies as of 05/10/2023       Reactions   Icosapent Ethyl (epa Ethyl Ester) (fish)    Hives w/Vascepa   Ketamine Other (See Comments)   In vegetative state for 15 minutes per pt   [place] Itching   Itchy throat Itchy throat Itchy throat Itchy throat Itchy throat    Itchy throat  Itchy throat Itchy throat Itchy throat Itchy throat    Itchy throat   Morphine Itching, Rash, Hives   Sorethroat   Penicillins Hives, Itching, Rash   Has patient had a PCN reaction causing immediate rash, facial/tongue/throat swelling, SOB or lightheadedness with hypotension: Y Has patient had a PCN reaction  causing severe rash involving mucus membranes or skin necrosis: Y Has patient had a PCN reaction that required hospitalization: N Has patient had a PCN reaction occurring within the last 10 years: Y   Clarithromycin Other (See Comments)   Stomach pain   Erythromycin    Stomach pain   Fentanyl Nausea And Vomiting   Penicillin V Hives, Itching, Other (See Comments)   Gi upset, also   Shellfish Allergy Other (See Comments)   Pt has never had shellfish but tested positive on allergy test. Pt states contrast in CT is okay   Prednisone Rash        Medication List     TAKE these medications    amitriptyline 10 MG tablet Commonly known as: ELAVIL Take 10 mg by mouth at bedtime.   aspirin EC 81 MG tablet Take 1 tablet (81 mg total) by mouth daily. Swallow whole.   azelastine 0.1 % nasal spray Commonly known as: ASTELIN Place 2 sprays into both nostrils daily at 6 (six) AM.   Blisovi Fe 1.5/30 1.5-30 MG-MCG tablet Generic drug: norethindrone-ethinyl estradiol-iron Take 1 tablet by mouth daily.   butalbital-acetaminophen-caffeine 50-325-40 MG tablet Commonly known as: FIORICET Take 1 tablet by mouth daily as needed  for headache.   carvedilol 3.125 MG tablet Commonly known as: COREG Take 1 tablet (3.125 mg total) by mouth 2 (two) times daily.   CRANBERRY CONCENTRATE PO Take 2 tablets by mouth 2 (two) times daily.   Creon 36000-114000 units Cpep capsule Generic drug: lipase/protease/amylase Take 1 capsule (36,000 Units total) by mouth 3 (three) times daily.   DULoxetine 60 MG capsule Commonly known as: CYMBALTA Take 60 mg by mouth at bedtime.   ergocalciferol 1.25 MG (50000 UT) capsule Commonly known as: VITAMIN D2 Take 50,000 Units by mouth every 7 (seven) days. Every Sunday   fenofibrate 54 MG tablet Take 1 tablet (54 mg total) by mouth daily. What changed: when to take this   HumuLIN R U-500 KwikPen 500 UNIT/ML KwikPen Generic drug: insulin regular human  CONCENTRATED Inject 185 Units into the skin 3 (three) times daily with meals. What changed:  how much to take when to take this additional instructions   hydrALAZINE 50 MG tablet Commonly known as: APRESOLINE Take 1 tablet (50 mg total) by mouth 3 (three) times daily. What changed:  medication strength how much to take   insulin lispro 100 UNIT/ML injection Commonly known as: HUMALOG Inject 30-40 Units into the skin See admin instructions. Inject subcutaneously 40 units with breakfast, 30 units with lunch and 30 units with dinner   Insulin Pen Needle 32G X 4 MM Misc 1 Device by Does not apply route QID. For use with insulin pens   ivabradine 7.5 MG Tabs tablet Commonly known as: CORLANOR Take 1 tablet (7.5 mg total) by mouth 2 (two) times daily with a meal. What changed: when to take this   levocetirizine 5 MG tablet Commonly known as: XYZAL Take 5 mg by mouth daily.   levothyroxine 137 MCG tablet Commonly known as: SYNTHROID Take 137 mcg by mouth daily.   metoCLOPramide 5 MG tablet Commonly known as: Reglan Take 1 tablet (5 mg total) by mouth every 8 (eight) hours as needed for nausea or vomiting.   pantoprazole 40 MG tablet Commonly known as: PROTONIX Take 40 mg by mouth daily.   polyethylene glycol 17 g packet Commonly known as: MIRALAX / GLYCOLAX Take 17 g by mouth daily as needed for mild constipation. Also available OTC   pregabalin 300 MG capsule Commonly known as: LYRICA Take 300 mg by mouth 2 (two) times daily.   prochlorperazine 10 MG tablet Commonly known as: COMPAZINE Take 10 mg by mouth at bedtime.   Repatha 140 MG/ML Sosy Generic drug: Evolocumab Inject 140 mg into the skin every 14 (fourteen) days. Saturdays   rifaximin 550 MG Tabs tablet Commonly known as: XIFAXAN Take 1 tablet (550 mg total) by mouth 3 (three) times daily.   rosuvastatin 40 MG tablet Commonly known as: CRESTOR Take 40 mg by mouth daily.   senna-docusate 8.6-50 MG  tablet Commonly known as: Senokot-S Take 2 tablets by mouth at bedtime. For constipation   sodium bicarbonate 650 MG tablet Take 2 tablets (1,300 mg total) by mouth 2 (two) times daily.   SUMAtriptan 25 MG tablet Commonly known as: IMITREX Take 25 mg by mouth every 2 (two) hours as needed.   tamsulosin 0.4 MG Caps capsule Commonly known as: FLOMAX Take 1 capsule (0.4 mg total) by mouth daily.   topiramate 50 MG tablet Commonly known as: TOPAMAX Take 50 mg by mouth 2 (two) times daily.   torsemide 20 MG tablet Commonly known as: DEMADEX Take 2 tablets (40 mg total) by mouth daily.  May take 1 additional tablet as needed What changed:  how much to take when to take this additional instructions   tretinoin 0.05 % cream Commonly known as: RETIN-A Apply 1 application topically at bedtime.        Follow-up Information     Theodosia Blender, MD Follow up on 05/15/2023.   Specialty: Urology Why: for hospital follow-up Contact information: [address] [place] [address] [place] [address] [phone]         Beverley Fiedler, MD. Schedule an appointment as soon as possible for a visit in 2 week(s).   Specialty: Gastroenterology Why: for hospital follow-up Contact information: [address] [place] [address] [phone]                Discharge Exam: S: No acute complaints, states had good BM yesterday, no acute abdominal pain, ate breakfast.  Wants go home today.  BP (!) 148/86 (BP Location: Right Arm)   Pulse 85   Temp (!) 97.3 F (36.3 C) (Oral)   Resp 18   LMP 03/22/2023   SpO2 98%   Physical Exam General: Alert and oriented x 3, NAD Cardiovascular: S1 S2 clear, RRR.  Respiratory: CTAB, no wheezing, rales or rhonchi Gastrointestinal: Soft, nontender, nondistended, NBS Ext: no pedal edema bilaterally Neuro: no new deficits Psych: Normal affect    Condition at discharge: fair  The results of significant diagnostics from this  hospitalization (including imaging, microbiology, ancillary and laboratory) are listed below for reference.   Imaging Studies: DG Abd Portable 1V Result Date: 05/09/2023 CLINICAL DATA:  Abdominal pain EXAM: PORTABLE ABDOMEN - 1 VIEW COMPARISON:  05/08/2023 FINDINGS: The bowel gas pattern is normal. No radio-opaque calculi or other significant radiographic abnormality are seen. Moderate volume stool throughout the colon. IMPRESSION: Nonobstructive bowel gas pattern. Moderate volume stool throughout the colon. Electronically Signed   By: Duanne Guess D.O.   On: 05/09/2023 20:50   CT RENAL STONE STUDY Result Date: 05/08/2023 CLINICAL DATA:  Flank pain EXAM: CT ABDOMEN AND PELVIS WITHOUT CONTRAST TECHNIQUE: Multidetector CT imaging of the abdomen and pelvis was performed following the standard protocol without IV contrast. RADIATION DOSE REDUCTION: This exam was performed according to the departmental dose-optimization program which includes automated exposure control, adjustment of the mA and/or kV according to patient size and/or use of iterative reconstruction technique. COMPARISON:  05/04/2023, 05/06/2023 FINDINGS: Lower chest: No acute pleural or parenchymal lung disease. Hepatobiliary: The liver is enlarged, with diffuse hepatic steatosis again noted. No biliary duct dilation. The gallbladder is unremarkable. Pancreas: Unremarkable unenhanced appearance. Spleen: Unremarkable unenhanced appearance. Adrenals/Urinary Tract: No urinary tract calculi or obstructive uropathy within either kidney. The adrenals are unremarkable. The bladder is decompressed with a Foley catheter in place. Stomach/Bowel: No bowel obstruction or ileus. Normal appendix right lower quadrant. No bowel wall thickening or inflammatory change. Vascular/Lymphatic: Aortic atherosclerosis. No enlarged abdominal or pelvic lymph nodes. Reproductive: Uterus and bilateral adnexa are unremarkable. Other: No free fluid or free intraperitoneal  gas. No abdominal wall hernia. Musculoskeletal: No acute or destructive bony lesions. Sequela from multiple abdominal wall subcutaneous injections incidentally noted. Reconstructed images demonstrate no additional findings. IMPRESSION: 1. No evidence of urinary tract calculi or obstructive uropathy within either kidney. 2. Stable hepatomegaly and hepatic steatosis. 3.  Aortic Atherosclerosis (ICD10-I70.0). Electronically Signed   By: Sharlet Salina M.D.   On: 05/08/2023 18:14   DG Abd Portable 1V Result Date: 05/07/2023 CLINICAL DATA:  Abdominal pain. EXAM: PORTABLE ABDOMEN - 1 VIEW  COMPARISON:  CT abdomen pelvis dated 05/04/2023. FINDINGS: No bowel dilatation or evidence of obstruction. Stool noted throughout the colon. The liver appears enlarged. No free air. No acute osseous pathology. IMPRESSION: Nonobstructive bowel gas pattern. Electronically Signed   By: Elgie Collard M.D.   On: 05/07/2023 21:15   US Abdomen Limited Result Date: 05/06/2023 CLINICAL DATA:  Elevated liver enzymes EXAM: ULTRASOUND ABDOMEN LIMITED RIGHT UPPER QUADRANT COMPARISON:  CT abdomen pelvis 05/04/2023 FINDINGS: Gallbladder: Gallstones: None Sludge: None Gallbladder Wall: Within normal limits Pericholecystic fluid: None Sonographic Murphy's Sign: Negative per technologist Common bile duct: Diameter: 4 mm Liver: Parenchymal echogenicity: Homogeneously increased Contours: Normal Lesions: None Portal vein: Patent.  Hepatopetal flow Other: None. IMPRESSION: Diffuse increased echogenicity of the hepatic parenchyma is a nonspecific indicator of hepatocellular dysfunction, most commonly steatosis. Electronically Signed   By: Acquanetta Belling M.D.   On: 05/06/2023 16:11   CT ABDOMEN PELVIS WO CONTRAST Result Date: 05/04/2023 CLINICAL DATA:  Abdominal pain. EXAM: CT ABDOMEN AND PELVIS WITHOUT CONTRAST TECHNIQUE: Multidetector CT imaging of the abdomen and pelvis was performed following the standard protocol without IV contrast. RADIATION DOSE  REDUCTION: This exam was performed according to the departmental dose-optimization program which includes automated exposure control, adjustment of the mA and/or kV according to patient size and/or use of iterative reconstruction technique. COMPARISON:  CT abdomen pelvis dated January 13, 2023. FINDINGS: Lower chest: No acute abnormality. Hepatobiliary: Liver remains enlarged with mildly decreased density but no focal abnormality. Normal gallbladder. No biliary dilatation. Pancreas: Unremarkable. No pancreatic ductal dilatation or surrounding inflammatory changes. Spleen: Borderline enlarged, unchanged.  No focal abnormality. Adrenals/Urinary Tract: Adrenal glands are unremarkable. Chronic renal cortical thinning again noted. No renal calculi, focal lesion, or hydronephrosis. Bladder is unremarkable. Stomach/Bowel: Stomach is within normal limits. High density material within the appendix, which is otherwise unremarkable. No evidence of bowel wall thickening, distention, or inflammatory changes. Vascular/Lymphatic: Aortic atherosclerosis. No enlarged abdominal or pelvic lymph nodes. Reproductive: Uterus and bilateral adnexa are unremarkable. Other: No free fluid or pneumoperitoneum. Musculoskeletal: No acute or significant osseous findings. IMPRESSION: 1. No acute intra-abdominal process. 2. Unchanged hepatomegaly with steatosis. 3.  Aortic Atherosclerosis (ICD10-I70.0). Electronically Signed   By: Obie Dredge M.D.   On: 05/04/2023 16:42   US Renal Result Date: 04/28/2023 CLINICAL DATA:  Bilateral flank pain EXAM: RENAL / URINARY TRACT ULTRASOUND COMPLETE COMPARISON:  None Available. FINDINGS: Right Kidney: Renal measurements: 10.4 x 4.7 x 4.2 cm = volume: 106 mL. Mild diffuse renal cortical atrophy. Echogenicity within normal limits. No mass or hydronephrosis visualized. Left Kidney: Renal measurements: 10.4 x 5.2 x 4.3 cm = volume: 122 mL. Minimal diffuse renal cortical atrophy. Echogenicity within normal  limits. No mass or hydronephrosis visualized. Bladder: Appears normal for degree of bladder distention. Bilateral ureteral jets are Other: At least mild hepatic steatosis is incidentally noted. IMPRESSION: 1. Mild bilateral renal cortical atrophy, right greater than left. Otherwise unremarkable renal sonogram. 2. At least mild hepatic steatosis. Electronically Signed   By: Helyn Numbers M.D.   On: 04/28/2023 22:10   DG Chest 2 View Result Date: 04/21/2023 CLINICAL DATA:  Shortness of breath EXAM: CHEST - 2 VIEW COMPARISON:  04/10/2023 FINDINGS: Heart and mediastinal contours are within normal limits. No focal opacities or effusions. No acute bony abnormality. IMPRESSION: No active cardiopulmonary disease. Electronically Signed   By: Charlett Nose M.D.   On: 04/21/2023 00:28   DG Chest Portable 1 View Result Date: 04/10/2023 CLINICAL DATA:  Fall.  Numbness and  tingling. EXAM: PORTABLE CHEST 1 VIEW COMPARISON:  01/17/2023. FINDINGS: Low lung volume. Bilateral lung fields are clear. Bilateral costophrenic angles are clear. Stable cardio-mediastinal silhouette. Redemonstration of metallic clip overlying the left lower lung zone. No acute osseous abnormalities. The soft tissues are within normal limits. IMPRESSION: *No active disease. Electronically Signed   By: Jules Schick M.D.   On: 04/10/2023 14:23   CT Head Wo Contrast Result Date: 04/10/2023 CLINICAL DATA:  Provided history: History of brain tumor. Numbness and tingling "all over body." Fall. EXAM: CT HEAD WITHOUT CONTRAST TECHNIQUE: Contiguous axial images were obtained from the base of the skull through the vertex without intravenous contrast. RADIATION DOSE REDUCTION: This exam was performed according to the departmental dose-optimization program which includes automated exposure control, adjustment of the mA and/or kV according to patient size and/or use of iterative reconstruction technique. COMPARISON:  Prior head CT examinations 09/05/2022 and  earlier. Brain MRI 12/06/2018. FINDINGS: Please note, there is limited assessment for residual/recurrent tumor on this non-contrast head CT. Brain: No age-advanced or lobar predominant parenchymal atrophy. Foci of chronic encephalomalacia/gliosis within the anterior frontal lobes bilaterally. Ill-defined focus of hyperdensity at the level of the midbrain, unchanged from the prior head CT of 09/05/2022. Chronic blood products were present at this site on the prior brain MRI of 12/06/2018. There is no acute intracranial hemorrhage. No demarcated cortical infarct. No extra-axial fluid collection. No midline shift. Vascular: No hyperdense vessel.  Atherosclerotic calcifications. Skull: Post-operative changes from prior right temporal craniotomy. Sinuses/Orbits: No mass or acute finding within the imaged orbits. No significant paranasal sinus disease at the imaged levels. IMPRESSION: 1. No acute intracranial hemorrhage or evidence of an acute infarct. 2. Ill-defined focus of hyperdensity at the level of the midbrain, unchanged from the prior head CT of 09/05/2022. This likely corresponds with the chronic blood products demonstrated at this site on the prior brain MRI of 12/06/2018. Please note, assessment for residual/recurrent tumor is limited on this non-contrast head CT. 3. Foci of chronic encephalomalacia/gliosis again noted within the anterior frontal lobes bilaterally, likely postsurgical/postprocedural. Electronically Signed   By: Jackey Loge D.O.   On: 04/10/2023 14:03    Microbiology: Results for orders placed or performed during the hospital encounter of 05/04/23  Urine Culture (for pregnant, neutropenic or urologic patients or patients with an indwelling urinary catheter)     Status: Abnormal   Collection Time: 05/04/23  9:32 PM   Specimen: Urine, Clean Catch  Result Value Ref Range Status   Specimen Description URINE, CLEAN CATCH  Final   Special Requests NONE  Final   Culture (A)  Final     >=100,000 COLONIES/mL GROUP B STREP(S.AGALACTIAE)ISOLATED TESTING AGAINST S. AGALACTIAE NOT ROUTINELY PERFORMED DUE TO PREDICTABILITY OF AMP/PEN/VAN SUSCEPTIBILITY. Performed at [facility], [address], [place], [place] [address]    Report Status 05/06/2023 FINAL  Final    Labs: CBC: Recent Labs  Lab 05/04/23 1145 05/04/23 1302 05/04/23 1459 05/05/23 0201 05/10/23 0406  WBC 8.6  --   --  8.2 6.5  NEUTROABS  --   --   --   --  3.6  HGB 14.6 14.6 12.9 11.8* 9.3*  HCT 42.7 43.0 38.0 35.4* 27.2*  MCV 92.2  --   --  93.2 92.2  PLT 180  --   --  161 126*   Basic Metabolic Panel: Recent Labs  Lab 05/05/23 0201 05/06/23 0502 05/07/23 0832 05/08/23 0405 05/09/23 0415 05/10/23 0406  NA 132* 130* 128*  124* 127* 129*  K 4.5 4.0 3.5 3.6 4.2 4.1  CL 98 95* 90* 86* 86* 91*  CO2 18* 19* 21* 21* 21* 22  GLUCOSE 234* 338* 265* 214* 353* 341*  BUN 37* 58* 66* 65* 63* 66*  CREATININE 1.88* 2.40* 2.72* 2.32* 2.63* 2.57*  CALCIUM 8.9 8.2* 8.0* 8.3* 8.6* 8.5*  PHOS 4.9*  --  5.2* 4.2 4.3 4.1   Liver Function Tests: Recent Labs  Lab 05/04/23 1145 05/04/23 1640 05/05/23 0201 05/07/23 0832 05/08/23 0405 05/09/23 0415 05/10/23 0406  AST 88* 55*  --   --   --   --   --   ALT 98* 81*  --   --   --   --   --   ALKPHOS 87 71  --   --   --   --   --   BILITOT 1.5* 0.7  --   --   --   --   --   PROT 9.5* 7.3  --   --   --   --   --   ALBUMIN 4.6 3.9 3.8 3.9 4.1 3.9 3.8   CBG: Recent Labs  Lab 05/09/23 0727 05/09/23 1128 05/09/23 1635 05/09/23 2022 05/10/23 0722  GLUCAP 294* 180* 167* 197* 306*    Discharge time spent: greater than 30 minutes.  Signed: Thad Ranger, MD [facility] 05/10/2023

## 2023-05-10 NOTE — ED Triage Notes (Addendum)
Requesting education on how to empty foley catheter bag. Endorses abd pain and bilateral foot pain. Dced today from [facility]. Abd pain has not improved since leaving.

## 2023-05-10 NOTE — ED Provider Notes (Signed)
[place] EMERGENCY DEPARTMENT AT [facility] Provider Note   CSN: [id number] Arrival date & time: 05/10/23  1750     History  Chief Complaint  Patient presents with   Abdominal Pain    Jennifer [name] is a 33 y.o. female.  33 year old female here today for multiple complaints including not knowing how to empty her Foley catheter bag, bilateral foot pain, chronic abdominal pain.  Patient discharged from [facility] ED today.   Abdominal Pain      Home Medications Prior to Admission medications   Medication Sig Start Date End Date Taking? Authorizing Provider  amitriptyline (ELAVIL) 10 MG tablet Take 10 mg by mouth at bedtime.    [provider]  aspirin EC 81 MG EC tablet Take 1 tablet (81 mg total) by mouth daily. Swallow whole. 12/07/20   Pennie Banter, DO  azelastine (ASTELIN) 0.1 % nasal spray Place 2 sprays into both nostrils daily at 6 (six) AM.    [provider]  BLISOVI FE 1.5/30 1.5-30 MG-MCG tablet Take 1 tablet by mouth daily.    [provider]  butalbital-acetaminophen-caffeine (FIORICET) 50-325-40 MG tablet Take 1 tablet by mouth daily as needed for headache. 09/26/22   [provider]  carvedilol (COREG) 3.125 MG tablet Take 1 tablet (3.125 mg total) by mouth 2 (two) times daily. 10/04/22   [name], [name], MD  CRANBERRY CONCENTRATE PO Take 2 tablets by mouth 2 (two) times daily.    [provider]  CREON 36000-114000 units CPEP capsule Take 1 capsule (36,000 Units total) by mouth 3 (three) times daily. 05/10/23   Rai, Delene Ruffini, MD  DULoxetine (CYMBALTA) 60 MG capsule Take 60 mg by mouth at bedtime. 09/26/22   [provider]  ergocalciferol (VITAMIN D2) 1.25 MG (50000 UT) capsule Take 50,000 Units by mouth every 7 (seven) days. Every Sunday    [provider]  Evolocumab (REPATHA) 140 MG/ML SOSY Inject 140 mg into the skin every 14 (fourteen) days. Saturdays    [provider]   fenofibrate 54 MG tablet Take 1 tablet (54 mg total) by mouth daily. Patient taking differently: Take 54 mg by mouth at bedtime. 12/01/22   [name], [name], MD  hydrALAZINE (APRESOLINE) 50 MG tablet Take 1 tablet (50 mg total) by mouth 3 (three) times daily. 05/10/23   Rai, Ripudeep K, MD  insulin lispro (HUMALOG) 100 UNIT/ML injection Inject 30-40 Units into the skin See admin instructions. Inject subcutaneously 40 units with breakfast, 30 units with lunch and 30 units with dinner    [provider]  Insulin Pen Needle 32G X 4 MM MISC 1 Device by Does not apply route QID. For use with insulin pens 02/13/22   [name], MD  insulin regular human CONCENTRATED (HUMULIN R U-500 KWIKPEN) 500 UNIT/ML KwikPen Inject 185 Units into the skin 3 (three) times daily with meals. Patient taking differently: Inject 195 Units into the skin 2 (two) times daily with a meal. Inject subcutaneously 195 units with breakfast and lunch 09/20/22 01/09/24  [name], MD  ivabradine (CORLANOR) 7.5 MG TABS tablet Take 1 tablet (7.5 mg total) by mouth 2 (two) times daily with a meal. Patient taking differently: Take 7.5 mg by mouth at bedtime. 01/31/23   Milford, Anderson Malta, FNP  levocetirizine (XYZAL) 5 MG tablet Take 5 mg by mouth daily. 03/05/22   [provider]  levothyroxine (SYNTHROID) 137 MCG tablet Take 137 mcg by mouth daily. 04/12/23   [provider]  metoCLOPramide (REGLAN) 5 MG tablet Take 1 tablet (5 mg total) by mouth every 8 (eight) hours as needed for nausea or vomiting. 05/10/23 05/09/24  Rai, Ripudeep K, MD  pantoprazole (PROTONIX) 40 MG tablet Take 40 mg by mouth daily. 12/24/19   [provider]  polyethylene glycol (MIRALAX / GLYCOLAX) 17 g packet Take 17 g by mouth daily as needed for mild constipation. Also available OTC 05/10/23   Rai, Ripudeep K, MD  pregabalin (LYRICA) 300 MG capsule Take 300 mg by mouth 2 (two) times daily.    [provider]   prochlorperazine (COMPAZINE) 10 MG tablet Take 10 mg by mouth at bedtime.    [provider]  rifaximin (XIFAXAN) 550 MG TABS tablet Take 1 tablet (550 mg total) by mouth 3 (three) times daily. 05/10/23   Rai, Delene Ruffini, MD  rosuvastatin (CRESTOR) 40 MG tablet Take 40 mg by mouth daily. 08/09/21   [provider]  senna-docusate (SENOKOT-S) 8.6-50 MG tablet Take 2 tablets by mouth at bedtime. For constipation 05/10/23   Rai, Ripudeep K, MD  sodium bicarbonate 650 MG tablet Take 2 tablets (1,300 mg total) by mouth 2 (two) times daily. 05/10/23   Rai, Delene Ruffini, MD  SUMAtriptan (IMITREX) 25 MG tablet Take 25 mg by mouth every 2 (two) hours as needed. 01/30/23   [provider]  tamsulosin (FLOMAX) 0.4 MG CAPS capsule Take 1 capsule (0.4 mg total) by mouth daily. 05/10/23   Rai, Delene Ruffini, MD  topiramate (TOPAMAX) 50 MG tablet Take 50 mg by mouth 2 (two) times daily.    [provider]  torsemide (DEMADEX) 20 MG tablet Take 2 tablets (40 mg total) by mouth daily. May take 1 additional tablet as needed Patient taking differently: Take 20-40 mg by mouth See admin instructions. Take 40mg  daily. May take 1 additional tablet as needed for swelling. 10/05/22   [name], [name], MD  tretinoin (RETIN-A) 0.05 % cream Apply 1 application topically at bedtime. 01/17/21   [provider]      Allergies    Icosapent ethyl (epa ethyl ester) (fish), Ketamine, [place], Morphine, Penicillins, Clarithromycin, Erythromycin, Fentanyl, Penicillin v, Shellfish allergy, and Prednisone    Review of Systems   Review of Systems  Gastrointestinal:  Positive for abdominal pain.    Physical Exam Updated Vital Signs BP (!) 147/77 (BP Location: Right Arm)   Pulse 93   Temp 98.2 F (36.8 C) (Oral)   Resp 18   LMP 03/22/2023   SpO2 96%  Physical Exam Vitals reviewed.  Cardiovascular:     Rate and Rhythm: Normal rate.  Abdominal:     General: Abdomen is flat. There is no  distension.     Palpations: Abdomen is soft.     Tenderness: There is no abdominal tenderness.  Neurological:     Mental Status: [name] alert.     ED Results / Procedures / Treatments   Labs (all labs ordered are listed, but only abnormal results are displayed) Labs Reviewed  URINALYSIS, W/ REFLEX TO CULTURE (INFECTION SUSPECTED) - Abnormal; Notable for the following components:      Result Value   Color, Urine COLORLESS (*)    Glucose, UA 100 (*)    Hgb urine dipstick MODERATE (*)    Protein, ur TRACE (*)    Bacteria, UA RARE (*)    All other components within normal limits  COMPREHENSIVE METABOLIC PANEL - Abnormal; Notable for the following components:  Sodium 132 (*)    Chloride 93 (*)    Glucose, Bld 469 (*)    BUN 69 (*)    Creatinine, Ser 2.92 (*)    Total Protein 8.3 (*)    ALT 46 (*)    GFR, Estimated 21 (*)    All other components within normal limits  LIPASE, BLOOD - Abnormal; Notable for the following components:   Lipase 57 (*)    All other components within normal limits  CBC WITH DIFFERENTIAL/PLATELET - Abnormal; Notable for the following components:   RBC 3.21 (*)    Hemoglobin 10.5 (*)    HCT 30.3 (*)    RDW 15.6 (*)    All other components within normal limits    EKG None  Radiology DG Foot 2 Views Left Result Date: 05/10/2023 CLINICAL DATA:  Bilateral foot pain. EXAM: LEFT FOOT - 2 VIEW; RIGHT FOOT - 2 VIEW COMPARISON:  Ankle radiographs 10/19/2018 and left foot radiographs 09/06/2022 FINDINGS: There is no evidence of fracture or dislocation. There is no evidence of arthropathy or other focal bone abnormality. Soft tissues are unremarkable. IMPRESSION: No acute fracture or dislocation in the bilateral feet. Electronically Signed   By: Minerva Fester M.D.   On: 05/10/2023 21:46   DG Foot 2 Views Right Result Date: 05/10/2023 CLINICAL DATA:  Bilateral foot pain. EXAM: LEFT FOOT - 2 VIEW; RIGHT FOOT - 2 VIEW COMPARISON:  Ankle radiographs 10/19/2018  and left foot radiographs 09/06/2022 FINDINGS: There is no evidence of fracture or dislocation. There is no evidence of arthropathy or other focal bone abnormality. Soft tissues are unremarkable. IMPRESSION: No acute fracture or dislocation in the bilateral feet. Electronically Signed   By: Minerva Fester M.D.   On: 05/10/2023 21:46   DG Abd Portable 1V Result Date: 05/09/2023 CLINICAL DATA:  Abdominal pain EXAM: PORTABLE ABDOMEN - 1 VIEW COMPARISON:  05/08/2023 FINDINGS: The bowel gas pattern is normal. No radio-opaque calculi or other significant radiographic abnormality are seen. Moderate volume stool throughout the colon. IMPRESSION: Nonobstructive bowel gas pattern. Moderate volume stool throughout the colon. Electronically Signed   By: Duanne Guess D.O.   On: 05/09/2023 20:50    Procedures Procedures    Medications Ordered in ED Medications - No data to display  ED Course/ Medical Decision Making/ A&P                                 Medical Decision Making 33 year old female here today with multiple complaints.  Regarding the patient's Foley catheter bag, she was shown how to change it.  Patient's labs reviewed, she is hyperglycemic, but not acidotic.  Her renal is roughly at her baseline.  Patient also endorsing pain in her bilateral feet, obtained bilateral plain films which are negative.  No trauma to the area, overall normal exam.  I took care of this patient admitted her 1 week ago for hyperkalemia.  I believe that the patient's health care is limited by the following social determinants of health-poor health literacy.  Will discharge patient, anticipate likely return visit to one of our emergency departments in the coming days.  At that time, patient may certainly be acidotic, or have worsening renal function.  However, today, patient's labs are at her baseline.    Amount and/or Complexity of Data Reviewed Radiology: ordered.           Final Clinical  Impression(s) / ED Diagnoses Final diagnoses:  Pain in both feet    Rx / DC Orders ED Discharge Orders     None         Arletha Pili, DO 05/10/23 2221

## 2023-05-11 DIAGNOSIS — E872 Acidosis, unspecified: Secondary | ICD-10-CM | POA: Diagnosis not present

## 2023-05-11 DIAGNOSIS — E783 Hyperchylomicronemia: Secondary | ICD-10-CM | POA: Diagnosis not present

## 2023-05-11 DIAGNOSIS — N189 Chronic kidney disease, unspecified: Secondary | ICD-10-CM | POA: Diagnosis not present

## 2023-05-11 DIAGNOSIS — E875 Hyperkalemia: Secondary | ICD-10-CM | POA: Diagnosis not present

## 2023-05-11 DIAGNOSIS — E1122 Type 2 diabetes mellitus with diabetic chronic kidney disease: Secondary | ICD-10-CM | POA: Diagnosis not present

## 2023-05-11 DIAGNOSIS — D631 Anemia in chronic kidney disease: Secondary | ICD-10-CM | POA: Diagnosis not present

## 2023-05-11 DIAGNOSIS — I130 Hypertensive heart and chronic kidney disease with heart failure and stage 1 through stage 4 chronic kidney disease, or unspecified chronic kidney disease: Secondary | ICD-10-CM | POA: Diagnosis not present

## 2023-05-11 DIAGNOSIS — K861 Other chronic pancreatitis: Secondary | ICD-10-CM | POA: Diagnosis not present

## 2023-05-11 DIAGNOSIS — N184 Chronic kidney disease, stage 4 (severe): Secondary | ICD-10-CM | POA: Diagnosis not present

## 2023-05-12 ENCOUNTER — Telehealth: Payer: Self-pay | Admitting: Internal Medicine

## 2023-05-12 NOTE — Telephone Encounter (Signed)
Dr. Rennis Golden  We have received a surgical clearance request for Jennifer [name] who is scheduled to undergo a left forearm flexor origin slide. They were seen recently in clinic on 04/05/2023. Can you please comment on surgical clearance for her upcoming procedure. Please forward you guidance and recommendations to P [facility] PREOP   Thank you, Robin Searing, NP

## 2023-05-12 NOTE — Telephone Encounter (Signed)
   Pre-operative Risk Assessment    Patient Name: Jennifer [name]  DOB: [birth year] MRN: [id number]   Date of last office visit: 04/05/23 Date of next office visit: Not scheduled   Request for Surgical Clearance    Procedure:   Left forearm flexor origin slide  Date of Surgery:  Clearance 05/24/23                                Surgeon:  Dr. Mack Hook Surgeon's Group or Practice Name:  [facility] Phone number:  [phone]  Fax number:  [phone]   Type of Clearance Requested:   - Medical  - Pharmacy:  Hold        Type of Anesthesia:  MAC   Additional requests/questions:   Caller Darel Hong) noted patient will have MAC anesthesia with block.    Signed, Annetta Maw   05/12/2023, 3:14 PM

## 2023-05-14 ENCOUNTER — Encounter: Payer: Self-pay | Admitting: Cardiology

## 2023-05-14 LAB — INSULIN ANTIBODIES, BLOOD: Insulin Antibodies, Human: <5 uU/mL

## 2023-05-15 ENCOUNTER — Telehealth: Payer: Self-pay

## 2023-05-15 NOTE — Telephone Encounter (Signed)
Patient has been scheduled for telephone appt med rec and consent done     Patient Consent for Virtual Visit         Jennifer [name] has provided verbal consent on 05/15/2023 for a virtual visit (video or telephone).   CONSENT FOR VIRTUAL VISIT FOR:  Jennifer [name]  By participating in this virtual visit I agree to the following:  I hereby voluntarily request, consent and authorize [facility] and its employed or contracted physicians, physician assistants, nurse practitioners or other licensed health care professionals (the Practitioner), to provide me with telemedicine health care services (the "Services") as deemed necessary by the treating Practitioner. I acknowledge and consent to receive the Services by the Practitioner via telemedicine. I understand that the telemedicine visit will involve communicating with the Practitioner through live audiovisual communication technology and the disclosure of certain medical information by electronic transmission. I acknowledge that I have been given the opportunity to request an in-person assessment or other available alternative prior to the telemedicine visit and am voluntarily participating in the telemedicine visit.  I understand that I have the right to withhold or withdraw my consent to the use of telemedicine in the course of my care at any time, without affecting my right to future care or treatment, and that the Practitioner or I may terminate the telemedicine visit at any time. I understand that I have the right to inspect all information obtained and/or recorded in the course of the telemedicine visit and may receive copies of available information for a reasonable fee.  I understand that some of the potential risks of receiving the Services via telemedicine include:  Delay or interruption in medical evaluation due to technological equipment failure or disruption; Information transmitted may not be sufficient (e.g. poor  resolution of images) to allow for appropriate medical decision making by the Practitioner; and/or  In rare instances, security protocols could fail, causing a breach of personal health information.  Furthermore, I acknowledge that it is my responsibility to provide information about my medical history, conditions and care that is complete and accurate to the best of my ability. I acknowledge that Practitioner's advice, recommendations, and/or decision may be based on factors not within their control, such as incomplete or inaccurate data provided by me or distortions of diagnostic images or specimens that may result from electronic transmissions. I understand that the practice of medicine is not an exact science and that Practitioner makes no warranties or guarantees regarding treatment outcomes. I acknowledge that a copy of this consent can be made available to me via my patient portal [facility] MyChart), or I can request a printed copy by calling the office of [facility].    I understand that my insurance will be billed for this visit.   I have read or had this consent read to me. I understand the contents of this consent, which adequately explains the benefits and risks of the Services being provided via telemedicine.  I have been provided ample opportunity to ask questions regarding this consent and the Services and have had my questions answered to my satisfaction. I give my informed consent for the services to be provided through the use of telemedicine in my medical care

## 2023-05-15 NOTE — Telephone Encounter (Signed)
Tried calling patient to schedule no answer and mailbox is full unable to leave vm will try again later per preop  APP it is okay to use provider slot on Tues 2/18 or Wed 2/19 due to patient's procedure scheduled 2/26

## 2023-05-15 NOTE — Telephone Encounter (Signed)
   Name: Jennifer [name]  DOB: 1990/07/27  MRN: [id number]  Primary Cardiologist: Yates Decamp, MD   Preoperative team, please contact this patient and set up a phone call appointment for further preoperative risk assessment. Please obtain consent and complete medication review. Thank you for your help.  Okay to use provider slot for Tuesday 2/18 or Wednesday 2/19.   I confirm that guidance regarding antiplatelet and oral anticoagulation therapy has been completed and, if necessary, noted below.  Ideally aspirin should be continued without interruption, however if the bleeding risk is too great, aspirin may be held for 5-7 days prior to surgery. Please resume aspirin post operatively when it is felt to be safe from a bleeding standpoint.    I also confirmed the patient resides in the state of [place]. As per [facility] telemedicine laws, the patient must reside in the state in which the provider is licensed.   Carlos Levering, NP 05/15/2023, 10:03 AM [facility]

## 2023-05-15 NOTE — Telephone Encounter (Signed)
Patient called back and has been scheduled for telephone appt

## 2023-05-16 ENCOUNTER — Ambulatory Visit: Payer: Medicaid Other | Attending: Cardiovascular Disease | Admitting: Student

## 2023-05-16 DIAGNOSIS — Z01810 Encounter for preprocedural cardiovascular examination: Secondary | ICD-10-CM | POA: Diagnosis not present

## 2023-05-16 NOTE — Progress Notes (Signed)
Virtual Visit via Telephone Note   Because of Jennifer [name]'s co-morbid illnesses, she is at least at moderate risk for complications without adequate follow up.  This format is felt to be most appropriate for this patient at this time.  The patient did not have access to video technology/had technical difficulties with video requiring transitioning to audio format only (telephone).  All issues noted in this document were discussed and addressed.  No physical exam could be performed with this format.  Please refer to the patient's chart for her consent to telehealth for [facility].  Evaluation Performed:  Preoperative cardiovascular risk assessment _____________   Date:  05/16/2023   Patient ID:  Jennifer [name], DOB 1990-09-14, MRN [id number] Patient Location:  Home Provider location:   Office  Primary Care Provider:  Nathaneil Canary, PA-C Primary Cardiologist:  Yates Decamp, MD  Chief Complaint / Patient Profile   33 y.o. y/o female with a h/o CAD, chronic systolic heart failure, pulmonary hypertension, PAF, hypertension, hyperlipidemia, GERD, NASH, pancreatitis, T2DM, hypothyroidism, CKD who is pending left forearm flexor origin slide by Dr. Janee Morn on 05/24/2023 and presents today for telephonic preoperative cardiovascular risk assessment.  History of Present Illness    Jennifer [name] is a 33 y.o. female who presents via audio/video conferencing for a telehealth visit today.  Pt was last seen in cardiology clinic on 01/31/2023 by Prince Rome, FNP.  At that time Shandee Jergens was stable from a cardiac standpoint.  The patient is now pending procedure as outlined above. Since her last visit, she is doing well. Patient denies shortness of breath, dyspnea on exertion, lower extremity edema, orthopnea or PND. No chest pain, pressure, or tightness. No palpitations.  She does not follow a formal exercise program. [name] independent with ADLs and able to perform light to  moderate household activities.   Past Medical History    Past Medical History:  Diagnosis Date   Afib (HCC) 05/12/2021   Brain tumor (HCC) 03/29/1995   astrocytoma   CHF (congestive heart failure) (HCC)    Cholesterosis    CKD (chronic kidney disease) stage 4, GFR 15-29 ml/min (HCC) 05/13/2021   DM (diabetes mellitus) (HCC) 10/10/2018   Fatty liver    HTN (hypertension) 10/10/2018   Hypothyroidism 10/10/2018   Lipoprotein deficiency    Lung disease    longevity long term   Pancreatitis    Polycystic ovary syndrome    Past Surgical History:  Procedure Laterality Date   ABDOMINAL SURGERY     pt states during miscarriage got her intestine   BRAIN SURGERY     EYE MUSCLE SURGERY Right 03/28/2014   PRESSURE SENSOR/CARDIOMEMS N/A 12/06/2021   Procedure: PRESSURE SENSOR/CARDIOMEMS;  Surgeon: Dolores Patty, MD;  Location: [facility];  Service: Cardiovascular;  Laterality: N/A;   RIGHT HEART CATH N/A 08/05/2021   Procedure: RIGHT HEART CATH;  Surgeon: Dolores Patty, MD;  Location: [facility];  Service: Cardiovascular;  Laterality: N/A;   RIGHT HEART CATH N/A 12/06/2021   Procedure: RIGHT HEART CATH;  Surgeon: Dolores Patty, MD;  Location: [facility];  Service: Cardiovascular;  Laterality: N/A;   RIGHT/LEFT HEART CATH AND CORONARY ANGIOGRAPHY N/A 12/03/2020   Procedure: RIGHT/LEFT HEART CATH AND CORONARY ANGIOGRAPHY;  Surgeon: Yates Decamp, MD;  Location: [facility];  Service: Cardiovascular;  Laterality: N/A;   VENTRICULOSTOMY  03/28/1997    Allergies  Allergies  Allergen Reactions   Icosapent Ethyl (Epa Ethyl  Ester) (Fish)     Hives w/Vascepa   Ketamine Other (See Comments)    In vegetative state for 15 minutes per pt   [place] Itching    Itchy throat  Itchy throat Itchy throat Itchy throat Itchy throat    Itchy throat  Itchy throat Itchy throat Itchy throat Itchy throat    Itchy throat   Morphine Itching, Rash and Hives     Sorethroat   Penicillins Hives, Itching and Rash    Has patient had a PCN reaction causing immediate rash, facial/tongue/throat swelling, SOB or lightheadedness with hypotension: Y Has patient had a PCN reaction causing severe rash involving mucus membranes or skin necrosis: Y Has patient had a PCN reaction that required hospitalization: N Has patient had a PCN reaction occurring within the last 10 years: Y   Clarithromycin Other (See Comments)    Stomach pain   Erythromycin     Stomach pain   Fentanyl Nausea And Vomiting   Penicillin V Hives, Itching and Other (See Comments)    Gi upset, also   Shellfish Allergy Other (See Comments)    Pt has never had shellfish but tested positive on allergy test. Pt states contrast in CT is okay   Prednisone Rash    Home Medications    Prior to Admission medications   Medication Sig Start Date End Date Taking? Authorizing Provider  amitriptyline (ELAVIL) 10 MG tablet Take 10 mg by mouth at bedtime.    [provider]  aspirin EC 81 MG EC tablet Take 1 tablet (81 mg total) by mouth daily. Swallow whole. 12/07/20   Pennie Banter, DO  azelastine (ASTELIN) 0.1 % nasal spray Place 2 sprays into both nostrils daily at 6 (six) AM.    [provider]  BLISOVI FE 1.5/30 1.5-30 MG-MCG tablet Take 1 tablet by mouth daily.    [provider]  butalbital-acetaminophen-caffeine (FIORICET) 50-325-40 MG tablet Take 1 tablet by mouth daily as needed for headache. 09/26/22   [provider]  carvedilol (COREG) 3.125 MG tablet Take 1 tablet (3.125 mg total) by mouth 2 (two) times daily. 10/04/22   [name], [name], MD  CRANBERRY CONCENTRATE PO Take 2 tablets by mouth 2 (two) times daily.    [provider]  CREON 36000-114000 units CPEP capsule Take 1 capsule (36,000 Units total) by mouth 3 (three) times daily. 05/10/23   Rai, Delene Ruffini, MD  DULoxetine (CYMBALTA) 60 MG capsule Take 60 mg by mouth at bedtime. 09/26/22    [provider]  ergocalciferol (VITAMIN D2) 1.25 MG (50000 UT) capsule Take 50,000 Units by mouth every 7 (seven) days. Every Sunday    [provider]  Evolocumab (REPATHA) 140 MG/ML SOSY Inject 140 mg into the skin every 14 (fourteen) days. Saturdays    [provider]  fenofibrate 54 MG tablet Take 1 tablet (54 mg total) by mouth daily. Patient taking differently: Take 54 mg by mouth at bedtime. 12/01/22   [name], [name], MD  hydrALAZINE (APRESOLINE) 50 MG tablet Take 1 tablet (50 mg total) by mouth 3 (three) times daily. 05/10/23   Rai, Ripudeep K, MD  insulin lispro (HUMALOG) 100 UNIT/ML injection Inject 30-40 Units into the skin See admin instructions. Inject subcutaneously 40 units with breakfast, 30 units with lunch and 30 units with dinner    [provider]  Insulin Pen Needle 32G X 4 MM MISC 1 Device by Does not apply route QID. For use with insulin pens 02/13/22  [name], MD  insulin regular human CONCENTRATED (HUMULIN R U-500 KWIKPEN) 500 UNIT/ML KwikPen Inject 185 Units into the skin 3 (three) times daily with meals. Patient taking differently: Inject 195 Units into the skin 2 (two) times daily with a meal. Inject subcutaneously 195 units with breakfast and lunch 09/20/22 01/09/24  [name], MD  ivabradine (CORLANOR) 7.5 MG TABS tablet Take 1 tablet (7.5 mg total) by mouth 2 (two) times daily with a meal. Patient taking differently: Take 7.5 mg by mouth at bedtime. 01/31/23   Milford, Anderson Malta, FNP  levocetirizine (XYZAL) 5 MG tablet Take 5 mg by mouth daily. 03/05/22   [provider]  levothyroxine (SYNTHROID) 137 MCG tablet Take 137 mcg by mouth daily. 04/12/23   [provider]  metoCLOPramide (REGLAN) 5 MG tablet Take 1 tablet (5 mg total) by mouth every 8 (eight) hours as needed for nausea or vomiting. 05/10/23 05/09/24  Rai, Ripudeep K, MD  pantoprazole (PROTONIX) 40 MG tablet Take 40 mg by mouth daily. 12/24/19    [provider]  polyethylene glycol (MIRALAX / GLYCOLAX) 17 g packet Take 17 g by mouth daily as needed for mild constipation. Also available OTC 05/10/23   Rai, Ripudeep K, MD  pregabalin (LYRICA) 300 MG capsule Take 300 mg by mouth 2 (two) times daily.    [provider]  prochlorperazine (COMPAZINE) 10 MG tablet Take 10 mg by mouth at bedtime.    [provider]  rifaximin (XIFAXAN) 550 MG TABS tablet Take 1 tablet (550 mg total) by mouth 3 (three) times daily. 05/10/23   Rai, Delene Ruffini, MD  rosuvastatin (CRESTOR) 40 MG tablet Take 40 mg by mouth daily. 08/09/21   [provider]  sodium bicarbonate 650 MG tablet Take 2 tablets (1,300 mg total) by mouth 2 (two) times daily. 05/10/23   Rai, Delene Ruffini, MD  SUMAtriptan (IMITREX) 25 MG tablet Take 25 mg by mouth every 2 (two) hours as needed. 01/30/23   [provider]  tamsulosin (FLOMAX) 0.4 MG CAPS capsule Take 1 capsule (0.4 mg total) by mouth daily. 05/10/23   Rai, Delene Ruffini, MD  topiramate (TOPAMAX) 50 MG tablet Take 50 mg by mouth 2 (two) times daily.    [provider]  torsemide (DEMADEX) 20 MG tablet Take 2 tablets (40 mg total) by mouth daily. May take 1 additional tablet as needed Patient taking differently: Take 20-40 mg by mouth See admin instructions. Take 40mg  daily. May take 1 additional tablet as needed for swelling. 10/05/22   [name], [name], MD  tretinoin (RETIN-A) 0.05 % cream Apply 1 application topically at bedtime. 01/17/21   [provider]    Physical Exam    Vital Signs:  Neyda Durango does not have vital signs available for review today.  Given telephonic nature of communication, physical exam is limited. AAOx3. NAD. Normal affect.  Speech and respirations are unlabored.   Assessment & Plan    Primary Cardiologist: Yates Decamp, MD  Preoperative cardiovascular risk assessment.  Left forearm flexor origin slide by Dr. Janee Morn on  05/24/2023.  Chart reviewed as part of pre-operative protocol coverage. According to the RCRI, patient has a 11% risk of MACE. Patient reports activity equivalent to 5.72 METS (per DASI).   Given past medical history and time since last visit, based on ACC/AHA guidelines, Sondra Blixt would be at acceptable risk for the planned procedure without further cardiovascular testing.   Patient was advised that if she develops  new symptoms prior to surgery to contact our office to arrange a follow-up appointment.  she verbalized understanding.  Ideally aspirin should be continued without interruption, however if the bleeding risk is too great, aspirin may be held for 5-7 days prior to surgery. Please resume aspirin post operatively when it is felt to be safe from a bleeding standpoint.     I will route this recommendation to the requesting party via Epic fax function.  Please call with questions.  Time:   Today, I have spent 5 minutes with the patient with telehealth technology discussing medical history, symptoms, and management plan.     Carlos Levering, NP  05/16/2023, 8:31 AM

## 2023-05-17 ENCOUNTER — Other Ambulatory Visit (HOSPITAL_COMMUNITY): Payer: Self-pay | Admitting: Nephrology

## 2023-05-17 ENCOUNTER — Encounter: Payer: Self-pay | Admitting: Cardiology

## 2023-05-17 DIAGNOSIS — Z794 Long term (current) use of insulin: Secondary | ICD-10-CM | POA: Diagnosis not present

## 2023-05-17 DIAGNOSIS — K638219 Small intestinal bacterial overgrowth, unspecified: Secondary | ICD-10-CM | POA: Diagnosis not present

## 2023-05-17 DIAGNOSIS — E781 Pure hyperglyceridemia: Secondary | ICD-10-CM | POA: Diagnosis not present

## 2023-05-17 DIAGNOSIS — I10 Essential (primary) hypertension: Secondary | ICD-10-CM | POA: Diagnosis not present

## 2023-05-17 DIAGNOSIS — K529 Noninfective gastroenteritis and colitis, unspecified: Secondary | ICD-10-CM | POA: Diagnosis not present

## 2023-05-17 DIAGNOSIS — E119 Type 2 diabetes mellitus without complications: Secondary | ICD-10-CM | POA: Diagnosis not present

## 2023-05-17 DIAGNOSIS — I5022 Chronic systolic (congestive) heart failure: Secondary | ICD-10-CM | POA: Diagnosis not present

## 2023-05-17 DIAGNOSIS — E039 Hypothyroidism, unspecified: Secondary | ICD-10-CM | POA: Diagnosis not present

## 2023-05-17 DIAGNOSIS — K760 Fatty (change of) liver, not elsewhere classified: Secondary | ICD-10-CM | POA: Diagnosis not present

## 2023-05-17 DIAGNOSIS — N184 Chronic kidney disease, stage 4 (severe): Secondary | ICD-10-CM | POA: Diagnosis not present

## 2023-05-17 DIAGNOSIS — K862 Cyst of pancreas: Secondary | ICD-10-CM | POA: Diagnosis not present

## 2023-05-17 DIAGNOSIS — E139 Other specified diabetes mellitus without complications: Secondary | ICD-10-CM | POA: Diagnosis not present

## 2023-05-18 DIAGNOSIS — E039 Hypothyroidism, unspecified: Secondary | ICD-10-CM | POA: Diagnosis not present

## 2023-05-18 DIAGNOSIS — E781 Pure hyperglyceridemia: Secondary | ICD-10-CM | POA: Diagnosis not present

## 2023-05-18 DIAGNOSIS — N184 Chronic kidney disease, stage 4 (severe): Secondary | ICD-10-CM | POA: Diagnosis not present

## 2023-05-18 DIAGNOSIS — E139 Other specified diabetes mellitus without complications: Secondary | ICD-10-CM | POA: Diagnosis not present

## 2023-05-23 DIAGNOSIS — Z7689 Persons encountering health services in other specified circumstances: Secondary | ICD-10-CM | POA: Diagnosis not present

## 2023-05-23 DIAGNOSIS — H34811 Central retinal vein occlusion, right eye, with macular edema: Secondary | ICD-10-CM | POA: Diagnosis not present

## 2023-05-24 DIAGNOSIS — M62432 Contracture of muscle, left forearm: Secondary | ICD-10-CM | POA: Diagnosis not present

## 2023-05-26 ENCOUNTER — Ambulatory Visit (HOSPITAL_COMMUNITY)
Admission: RE | Admit: 2023-05-26 | Discharge: 2023-05-26 | Disposition: A | Discharge status: Home / Self Care | Payer: Medicaid Other | Source: Ambulatory Visit | Attending: Nephrology | Admitting: Nephrology

## 2023-05-26 ENCOUNTER — Encounter (HOSPITAL_COMMUNITY)
Admission: RE | Admit: 2023-05-26 | Discharge: 2023-05-26 | Disposition: A | Discharge status: Home / Self Care | Payer: Medicaid Other | Source: Ambulatory Visit | Attending: Nephrology | Admitting: Nephrology

## 2023-05-26 DIAGNOSIS — Z7689 Persons encountering health services in other specified circumstances: Secondary | ICD-10-CM | POA: Diagnosis not present

## 2023-05-26 MED ORDER — TECHNETIUM TC 99M SESTAMIBI - CARDIOLITE
25.0000 | Freq: Once | INTRAVENOUS | Status: AC | PRN
  Administered 2023-05-26: 25 via INTRAVENOUS

## 2023-05-27 DIAGNOSIS — Z419 Encounter for procedure for purposes other than remedying health state, unspecified: Secondary | ICD-10-CM | POA: Diagnosis not present

## 2023-05-29 IMAGING — DX DG CHEST 1V PORT
1 series · 1 of 1 positions shown · non-contrast
Comparison: March 09, 2021

CLINICAL DATA: Shortness of breath, sore throat and fever.

EXAM:
PORTABLE CHEST 1 VIEW

[chest]
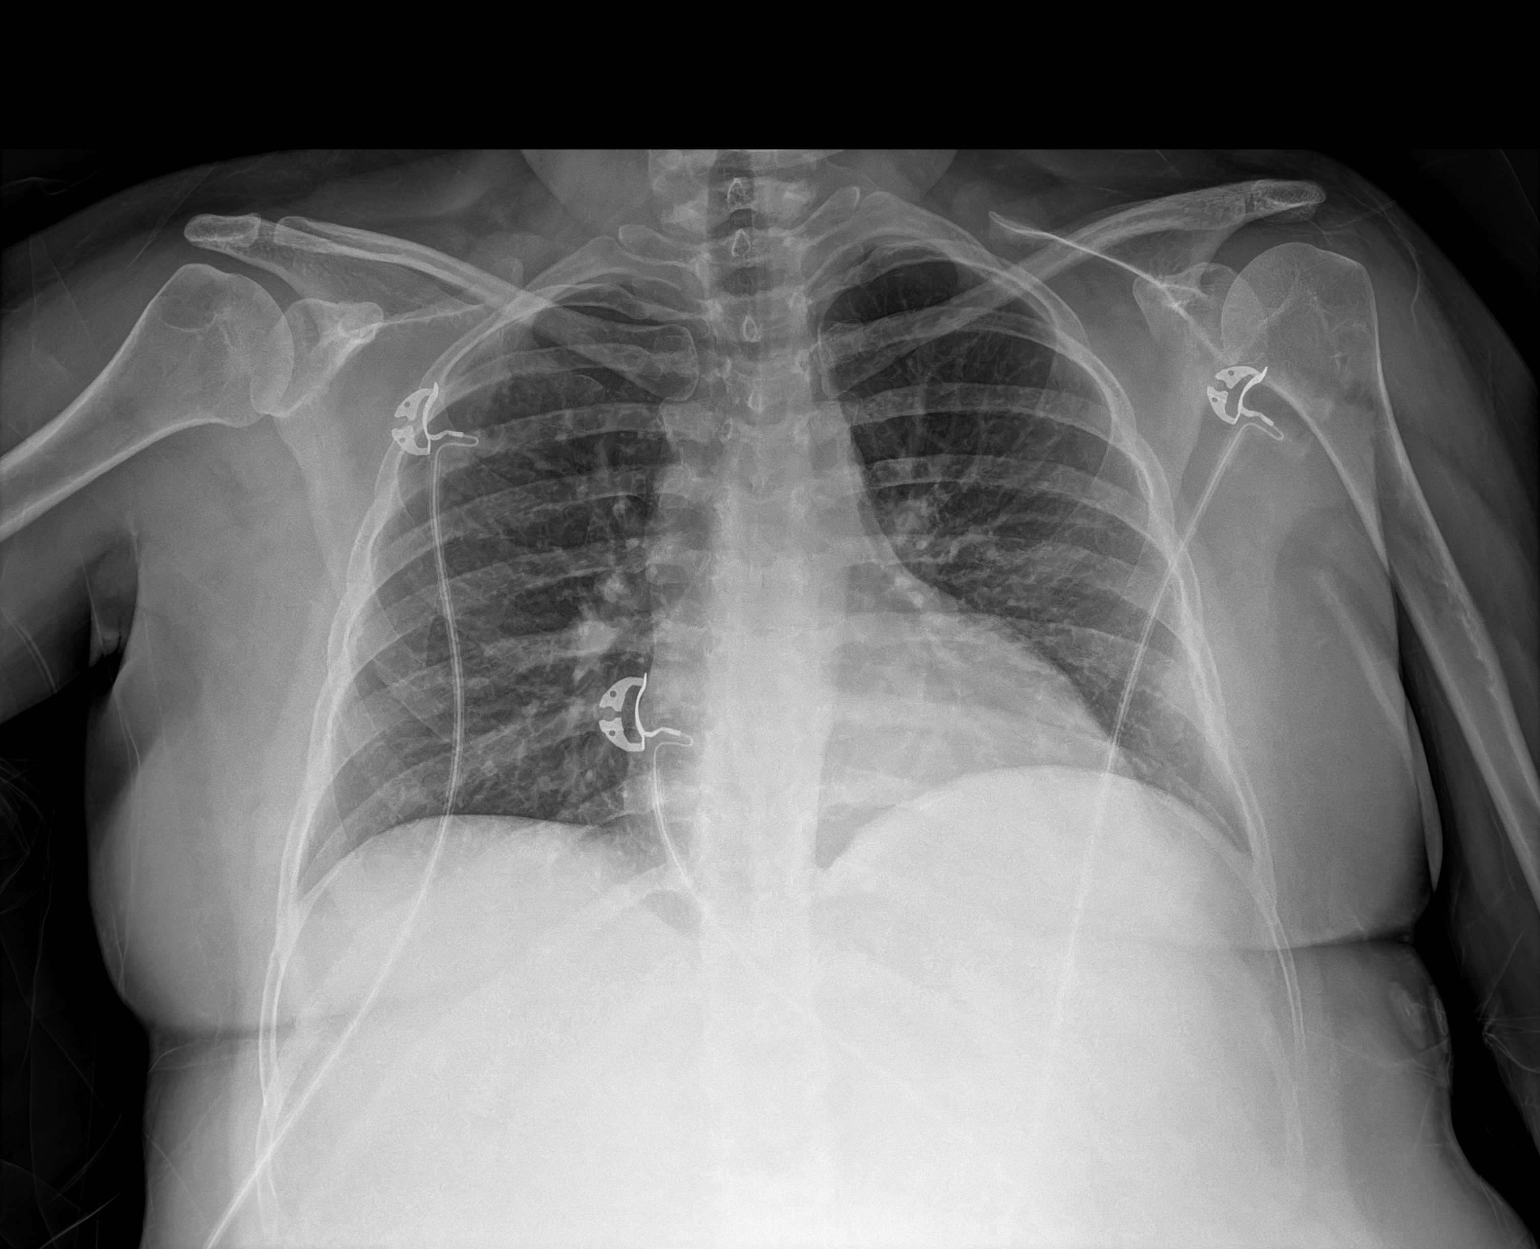

[1 of 1 positions shown; findings below may reference images not displayed]

FINDINGS: The heart size and mediastinal contours are within normal limits.
Both lungs are clear. The visualized skeletal structures are
unremarkable.
IMPRESSION: No active disease.

## 2023-06-01 DIAGNOSIS — Z7689 Persons encountering health services in other specified circumstances: Secondary | ICD-10-CM | POA: Diagnosis not present

## 2023-06-01 DIAGNOSIS — R300 Dysuria: Secondary | ICD-10-CM | POA: Diagnosis not present

## 2023-06-01 DIAGNOSIS — N390 Urinary tract infection, site not specified: Secondary | ICD-10-CM | POA: Diagnosis not present

## 2023-06-01 DIAGNOSIS — M25532 Pain in left wrist: Secondary | ICD-10-CM | POA: Diagnosis not present

## 2023-06-06 ENCOUNTER — Ambulatory Visit (INDEPENDENT_AMBULATORY_CARE_PROVIDER_SITE_OTHER): Payer: Medicaid Other | Admitting: Dermatology

## 2023-06-06 ENCOUNTER — Encounter: Payer: Self-pay | Admitting: Dermatology

## 2023-06-06 VITALS — BP 120/77

## 2023-06-06 DIAGNOSIS — L649 Androgenic alopecia, unspecified: Secondary | ICD-10-CM

## 2023-06-06 DIAGNOSIS — L680 Hirsutism: Secondary | ICD-10-CM

## 2023-06-06 DIAGNOSIS — L659 Nonscarring hair loss, unspecified: Secondary | ICD-10-CM

## 2023-06-06 DIAGNOSIS — L732 Hidradenitis suppurativa: Secondary | ICD-10-CM | POA: Diagnosis not present

## 2023-06-06 DIAGNOSIS — Z7689 Persons encountering health services in other specified circumstances: Secondary | ICD-10-CM | POA: Diagnosis not present

## 2023-06-06 MED ORDER — DOXYCYCLINE HYCLATE 100 MG PO TABS: 100.0000 mg | ORAL_TABLET | Freq: Every day | ORAL | 2 refills | Status: DC

## 2023-06-06 MED ORDER — SAFETY SEAL MISCELLANEOUS MISC
2 refills | Status: AC
Start: 2023-06-06 — End: ?

## 2023-06-06 NOTE — Patient Instructions (Addendum)
 Hello Jennifer [name] ,  Thank you for visiting my office today. I appreciate your commitment to improving your health and addressing your concerns. Here is a summary of our discussion and the key instructions for your care:  - Hidradenitis Suppurativa:   - Wash underarms daily with benzoyl peroxide to reduce flare frequency.   - In case of a flare, take doxycycline with food. Please confirm if you have any antibiotic allergies.  - Hair Growth Management:   - Laser hair removal is recommended, requiring six to nine sessions, with maintenance every two years.   - Use an electric shaver as a temporary solution.  - Hair Loss/Thinning Treatment:   - Consider using Viviscal vitamins, collagen supplements, and a topical treatment containing minoxidil and finasteride.   - Consult your endocrinologist before starting any new supplements or topicals to ensure they are safe for your liver condition.   - Apply the topical treatment in the morning to avoid transferring it to your face at night.   - We will monitor improvement in your hair condition with a follow-up in six months.  Please ensure to follow up with your endocrinologist regarding the supplements and continue monitoring your health as discussed. If you have any questions or need further assistance, feel free to contact our office.  Warm regards,  Dr. Langston Reusing, Dermatology             Important Information  Due to recent changes in healthcare laws, you may see results of your pathology and/or laboratory studies on MyChart before the doctors have had a chance to review them. We understand that in some cases there may be results that are confusing or concerning to you. Please understand that not all results are received at the same time and often the doctors may need to interpret multiple results in order to provide you with the best plan of care or course of treatment. Therefore, we ask that you please give [place] 2 business days to  thoroughly review all your results before contacting the office for clarification. Should we see a critical lab result, you will be contacted sooner.   If You Need Anything After Your Visit  If you have any questions or concerns for your doctor, please call our main line at [phone] If no one answers, please leave a voicemail as directed and we will return your call as soon as possible. Messages left after 4 pm will be answered the following business day.   You may also send [place] a message via MyChart. We typically respond to MyChart messages within 1-2 business days.  For prescription refills, please ask your pharmacy to contact our office. Our fax number is [phone].  If you have an urgent issue when the clinic is closed that cannot wait until the next business day, you can page your doctor at the number below.    Please note that while we do our best to be available for urgent issues outside of office hours, we are not available 24/7.   If you have an urgent issue and are unable to reach [place], you may choose to seek medical care at your doctor's office, retail clinic, urgent care center, or emergency room.  If you have a medical emergency, please immediately call 911 or go to the emergency department. In the event of inclement weather, please call our main line at [phone] for an update on the status of any delays or closures.  Dermatology Medication Tips: Please keep the boxes that  topical medications come in in order to help keep track of the instructions about where and how to use these. Pharmacies typically print the medication instructions only on the boxes and not directly on the medication tubes.   If your medication is too expensive, please contact our office at [phone] or send [place] a message through MyChart.   We are unable to tell what your co-pay for medications will be in advance as this is different depending on your insurance coverage. However, we may be able to  find a substitute medication at lower cost or fill out paperwork to get insurance to cover a needed medication.   If a prior authorization is required to get your medication covered by your insurance company, please allow [place] 1-2 business days to complete this process.  Drug prices often vary depending on where the prescription is filled and some pharmacies may offer cheaper prices.  The website www.goodrx.com contains coupons for medications through different pharmacies. The prices here do not account for what the cost may be with help from insurance (it may be cheaper with your insurance), but the website can give you the price if you did not use any insurance.  - You can print the associated coupon and take it with your prescription to the pharmacy.  - You may also stop by our office during regular business hours and pick up a GoodRx coupon card.  - If you need your prescription sent electronically to a different pharmacy, notify our office through [facility] or by phone at [phone]

## 2023-06-06 NOTE — Progress Notes (Signed)
 New Patient Visit   Subjective  Jennifer [name] is a 33 y.o. female who presents for the following: New Pt - Facial Hair  Patient states she has facial hair located at the face that she would like to have examined. Patient reports the areas have been there for 5-6 years. She reports the areas are not bothersome.Patient rates irritation 0 out of 10. She states that the areas have not spread. Patient reports she has not previously been treated for these areas. Patient denied Hx of bx. Patient denied family history of skin cancer(s).   The following portions of the chart were reviewed this encounter and updated as appropriate: medications, allergies, medical history  Review of Systems:  No other skin or systemic complaints except as noted in HPI or Assessment and Plan.  Objective  Well appearing patient in no apparent distress; mood and affect are within normal limits.   A focused examination was performed of the following areas: face   Relevant exam findings are noted in the Assessment and Plan.     Assessment & Plan   HIDRADENITIS SUPPURATIVA Exam: hurley stage 1 no active lesions today PIH from prior flare  stable  Hidradenitis Suppurativa is a chronic; persistent; non-curable, but treatable condition due to abnormal inflamed sweat glands in the body folds (axilla, inframammary, groin, medial thighs), causing recurrent painful draining cysts and scarring. It can be associated with severe scarring acne and cysts; also abscesses and scarring of scalp. The goal is control and prevention of flares, as it is not curable. Scars are permanent and can be thickened. Treatment may include daily use of topical medication and oral antibiotics.  Oral isotretinoin may also be helpful.  For some cases, Humira or Cosentyx (biologic injections) may be prescribed to decrease the inflammatory process and prevent flares.  When indicated, inflamed cysts may also be treated surgically.  -  Assessment: Patient has a history of axillary abscesses consistent with hidradenitis suppurativa. The condition appears to be in early stage one with no active lesions but some post-inflammatory hyperpigmentation. Recent improvement in symptoms may be attributed to better glycemic control.  Treatment Plan: - Recommended using BP on affected areas to decrease the amount of flares - Rx Doxycycline 100mg  - take soon as you feel flare coming on.    UNWANTED FACIAL HAIR - Assessment: Patient presents with unwanted hair growth, related to PCOS. Unable to use spironolactone due to hyperkalemia secondary to familial lipoprotein deficiency. Patient has a history of two myocardial infarctions and is currently on Repatha for cholesterol management. Patient also reports a history of pancreatitis and follows a low to no-fat diet.  - Plan:    Recommend electric shaver for temporary hair removal    Discuss laser hair removal as a long-term solution, requiring 6-9 initial sessions with maintenance every two years    Patient to consult with endocrinologist before starting any new treatments  ANDROGENETIC ALOPECIA (FEMALE PATTERN HAIR LOSS) Exam: Diffuse thinning of the crown and widening of the midline part with retention of the frontal hairline  flared  Female Androgenic Alopecia is a chronic condition related to genetics and/or hormonal changes.  In women androgenetic alopecia is commonly associated with menopause but may occur any time after puberty.  It causes hair thinning primarily on the crown with widening of the part and temporal hairline recession.  Treatment Plan: - Recommended using Viviscal and/or Vital Proteins. Informed pt to get the ok from PCP as she has liver conditions - Rx Medrock Hormonic  Hair Solution - use daily QAM   Long term medication management.  Patient is using long term (months to years) prescription medication  to control their dermatologic condition.  These medications  require periodic monitoring to evaluate for efficacy and side effects and may require periodic laboratory monitoring.     No follow-ups on file.    Documentation: I have reviewed the above documentation for accuracy and completeness, and I agree with the above.  I, Shirron Marcha Solders, CMA, am acting as scribe for [employer], DO.   Langston Reusing, DO

## 2023-06-08 DIAGNOSIS — M25532 Pain in left wrist: Secondary | ICD-10-CM | POA: Diagnosis not present

## 2023-06-09 ENCOUNTER — Encounter: Payer: Self-pay | Admitting: Podiatry

## 2023-06-09 ENCOUNTER — Ambulatory Visit: Payer: Medicaid Other | Admitting: Podiatry

## 2023-06-09 DIAGNOSIS — D492 Neoplasm of unspecified behavior of bone, soft tissue, and skin: Secondary | ICD-10-CM

## 2023-06-09 DIAGNOSIS — Z7689 Persons encountering health services in other specified circumstances: Secondary | ICD-10-CM | POA: Diagnosis not present

## 2023-06-09 DIAGNOSIS — T148XXA Other injury of unspecified body region, initial encounter: Secondary | ICD-10-CM

## 2023-06-09 MED ORDER — DICLOFENAC SODIUM 75 MG PO TBEC: 75.0000 mg | DELAYED_RELEASE_TABLET | Freq: Two times a day (BID) | ORAL | 0 refills | Status: DC

## 2023-06-09 NOTE — Progress Notes (Signed)
 Subjective:  Patient ID: Jennifer [name], female    DOB: 03/10/1991,  MRN: [id number]  Chief Complaint  Patient presents with   Callouses    Return patient with complaints of callouses bilateral big toes, pads of feet and heels of feet.    33 y.o. female presents with the above complaint.  Patient presents with right submet 2 and left submetatarsal 3 porokeratotic lesion painful to touch is progressive and worse worse with ambulation worse with pressure she has not seen MRIs prior to seeing me.  She would like to discuss treatment options she is a diabetic.  [name] worried that it may turn an ulceration.  She has not seen and was prior to seeing me pain scale 7 out of 10 dull achy in nature   Review of Systems: Negative except as noted in the HPI. Denies N/V/F/Ch.  Past Medical History:  Diagnosis Date   Afib (HCC) 05/12/2021   Brain tumor (HCC) 03/29/1995   astrocytoma   CHF (congestive heart failure) (HCC)    Cholesterosis    CKD (chronic kidney disease) stage 4, GFR 15-29 ml/min (HCC) 05/13/2021   DM (diabetes mellitus) (HCC) 10/10/2018   Fatty liver    HTN (hypertension) 10/10/2018   Hypothyroidism 10/10/2018   Lipoprotein deficiency    Lung disease    longevity long term   Pancreatitis    Polycystic ovary syndrome     Current Outpatient Medications:    amitriptyline (ELAVIL) 10 MG tablet, Take 10 mg by mouth at bedtime., Disp: , Rfl:    aspirin EC 81 MG EC tablet, Take 1 tablet (81 mg total) by mouth daily. Swallow whole., Disp: 30 tablet, Rfl: 1   azelastine (ASTELIN) 0.1 % nasal spray, Place 2 sprays into both nostrils daily at 6 (six) AM., Disp: , Rfl:    BLISOVI FE 1.5/30 1.5-30 MG-MCG tablet, Take 1 tablet by mouth daily., Disp: , Rfl:    butalbital-acetaminophen-caffeine (FIORICET) 50-325-40 MG tablet, Take 1 tablet by mouth daily as needed for headache., Disp: , Rfl:    carvedilol (COREG) 3.125 MG tablet, Take 1 tablet (3.125 mg total) by mouth 2 (two) times  daily., Disp: 60 tablet, Rfl: 11   CRANBERRY CONCENTRATE PO, Take 2 tablets by mouth 2 (two) times daily., Disp: , Rfl:    CREON 36000-114000 units CPEP capsule, Take 1 capsule (36,000 Units total) by mouth 3 (three) times daily., Disp: 90 capsule, Rfl: 3   doxycycline (VIBRA-TABS) 100 MG tablet, Take 1 tablet (100 mg total) by mouth daily. Take with food., Disp: 30 tablet, Rfl: 2   DULoxetine (CYMBALTA) 60 MG capsule, Take 60 mg by mouth at bedtime., Disp: , Rfl:    ergocalciferol (VITAMIN D2) 1.25 MG (50000 UT) capsule, Take 50,000 Units by mouth every 7 (seven) days. Every Sunday, Disp: , Rfl:    Evolocumab (REPATHA) 140 MG/ML SOSY, Inject 140 mg into the skin every 14 (fourteen) days. Saturdays, Disp: , Rfl:    fenofibrate 54 MG tablet, Take 1 tablet (54 mg total) by mouth daily. (Patient taking differently: Take 54 mg by mouth at bedtime.), Disp: 30 tablet, Rfl: 11   hydrALAZINE (APRESOLINE) 50 MG tablet, Take 1 tablet (50 mg total) by mouth 3 (three) times daily., Disp: 90 tablet, Rfl: 3   insulin lispro (HUMALOG) 100 UNIT/ML injection, Inject 30-40 Units into the skin See admin instructions. Inject subcutaneously 40 units with breakfast, 30 units with lunch and 30 units with dinner, Disp: , Rfl:    Insulin Pen  Needle 32G X 4 MM MISC, 1 Device by Does not apply route QID. For use with insulin pens, Disp: 100 each, Rfl: 0   insulin regular human CONCENTRATED (HUMULIN R U-500 KWIKPEN) 500 UNIT/ML KwikPen, Inject 185 Units into the skin 3 (three) times daily with meals. (Patient taking differently: Inject 195 Units into the skin 2 (two) times daily with a meal. Inject subcutaneously 195 units with breakfast and lunch), Disp: 33.3 mL, Rfl: 0   ivabradine (CORLANOR) 7.5 MG TABS tablet, Take 1 tablet (7.5 mg total) by mouth 2 (two) times daily with a meal. (Patient taking differently: Take 7.5 mg by mouth at bedtime.), Disp: 60 tablet, Rfl: 6   levocetirizine (XYZAL) 5 MG tablet, Take 5 mg by mouth  daily., Disp: , Rfl:    levothyroxine (SYNTHROID) 137 MCG tablet, Take 137 mcg by mouth daily., Disp: , Rfl:    metoCLOPramide (REGLAN) 5 MG tablet, Take 1 tablet (5 mg total) by mouth every 8 (eight) hours as needed for nausea or vomiting., Disp: 90 tablet, Rfl: 0   pantoprazole (PROTONIX) 40 MG tablet, Take 40 mg by mouth daily., Disp: , Rfl:    polyethylene glycol (MIRALAX / GLYCOLAX) 17 g packet, Take 17 g by mouth daily as needed for mild constipation. Also available OTC, Disp: 30 each, Rfl: 0   pregabalin (LYRICA) 300 MG capsule, Take 300 mg by mouth 2 (two) times daily., Disp: , Rfl:    prochlorperazine (COMPAZINE) 10 MG tablet, Take 10 mg by mouth at bedtime., Disp: , Rfl:    rifaximin (XIFAXAN) 550 MG TABS tablet, Take 1 tablet (550 mg total) by mouth 3 (three) times daily., Disp: 90 tablet, Rfl: 1   rosuvastatin (CRESTOR) 40 MG tablet, Take 40 mg by mouth daily., Disp: , Rfl:    Safety Seal Miscellaneous MISC, Hormonic Hair Solution with minoxidil USP 7% and finasteride USP 0.05% - apply to affected areas daily every morning., Disp: 30 mL, Rfl: 2   sodium bicarbonate 650 MG tablet, Take 2 tablets (1,300 mg total) by mouth 2 (two) times daily., Disp: 120 tablet, Rfl: 3   SUMAtriptan (IMITREX) 25 MG tablet, Take 25 mg by mouth every 2 (two) hours as needed., Disp: , Rfl:    tamsulosin (FLOMAX) 0.4 MG CAPS capsule, Take 1 capsule (0.4 mg total) by mouth daily., Disp: 30 capsule, Rfl: 0   topiramate (TOPAMAX) 50 MG tablet, Take 50 mg by mouth 2 (two) times daily., Disp: , Rfl:    torsemide (DEMADEX) 20 MG tablet, Take 2 tablets (40 mg total) by mouth daily. May take 1 additional tablet as needed (Patient taking differently: Take 20-40 mg by mouth See admin instructions. Take 40mg  daily. May take 1 additional tablet as needed for swelling.), Disp: 90 tablet, Rfl: 2   tretinoin (RETIN-A) 0.05 % cream, Apply 1 application topically at bedtime., Disp: , Rfl:   Social History   Tobacco Use   Smoking Status Never  Smokeless Tobacco Never    Allergies  Allergen Reactions   Icosapent Ethyl (Epa Ethyl Ester) (Fish)     Hives w/Vascepa   Ketamine Other (See Comments)    In vegetative state for 15 minutes per pt   [place] Itching    Itchy throat  Itchy throat Itchy throat Itchy throat Itchy throat    Itchy throat  Itchy throat Itchy throat Itchy throat Itchy throat    Itchy throat   Morphine Itching, Rash and Hives    Sorethroat   Penicillins Hives, Itching  and Rash    Has patient had a PCN reaction causing immediate rash, facial/tongue/throat swelling, SOB or lightheadedness with hypotension: Y Has patient had a PCN reaction causing severe rash involving mucus membranes or skin necrosis: Y Has patient had a PCN reaction that required hospitalization: N Has patient had a PCN reaction occurring within the last 10 years: Y   Clarithromycin Other (See Comments)    Stomach pain   Erythromycin     Stomach pain   Fentanyl Nausea And Vomiting   Penicillin V Hives, Itching and Other (See Comments)    Gi upset, also   Shellfish Allergy Other (See Comments)    Pt has never had shellfish but tested positive on allergy test. Pt states contrast in CT is okay   Prednisone Rash   Objective:  There were no vitals filed for this visit. There is no height or weight on file to calculate BMI. Constitutional Well developed. Well nourished.  Vascular Dorsalis pedis pulses palpable bilaterally. Posterior tibial pulses palpable bilaterally. Capillary refill normal to all digits.  No cyanosis or clubbing noted. Pedal hair growth normal.  Neurologic Normal speech. Oriented to person, place, and time. Epicritic sensation to light touch grossly present bilaterally.  Dermatologic Left submetatarsal 2 right submetatarsal 3 porokeratosis.  Central nucleated core noted.  Pain on palpation to the lesion no pinpoint bleeding noted  Orthopedic: Normal joint ROM without pain or crepitus  bilaterally. No visible deformities. No bony tenderness.   Radiographs: None Assessment:   No diagnosis found.  Plan:  Patient was evaluated and treated and all questions answered.  Left submetatarsal 2 and right submetatarsal 3 porokeratosis -All questions and concerns were discussed with the patient extensive detail given The pain that she is having she will benefit from aggressive debridement of the lesion using chisel blade handle the lesion was debrided down to healthy dry tissue no complication noted no pinpoint bleeding noted. -Shoe gear modification was discussed  No follow-ups on file.

## 2023-06-09 NOTE — Addendum Note (Signed)
 Addended by: Nicholes Rough on: 06/09/2023 11:14 AM   Modules accepted: Orders

## 2023-06-12 ENCOUNTER — Telehealth: Payer: Self-pay

## 2023-06-12 NOTE — Telephone Encounter (Signed)
 PA request received from [facility] for Diclofenac sodium Dr 75mg  tablet. PA submitted through covermymeds.

## 2023-06-12 NOTE — Telephone Encounter (Signed)
 PA was denied.     Naproxen, ibuprofen and indomethacin are covered.

## 2023-06-18 ENCOUNTER — Other Ambulatory Visit: Payer: Self-pay

## 2023-06-18 ENCOUNTER — Emergency Department (HOSPITAL_BASED_OUTPATIENT_CLINIC_OR_DEPARTMENT_OTHER)
Admission: EM | Admit: 2023-06-18 | Discharge: 2023-06-18 | Disposition: A | Discharge status: Home / Self Care | Attending: Emergency Medicine | Admitting: Emergency Medicine

## 2023-06-18 ENCOUNTER — Encounter (HOSPITAL_BASED_OUTPATIENT_CLINIC_OR_DEPARTMENT_OTHER): Payer: Self-pay

## 2023-06-18 DIAGNOSIS — I5043 Acute on chronic combined systolic (congestive) and diastolic (congestive) heart failure: Secondary | ICD-10-CM | POA: Diagnosis not present

## 2023-06-18 DIAGNOSIS — E1122 Type 2 diabetes mellitus with diabetic chronic kidney disease: Secondary | ICD-10-CM | POA: Diagnosis not present

## 2023-06-18 DIAGNOSIS — N184 Chronic kidney disease, stage 4 (severe): Secondary | ICD-10-CM | POA: Insufficient documentation

## 2023-06-18 DIAGNOSIS — R1013 Epigastric pain: Secondary | ICD-10-CM | POA: Diagnosis not present

## 2023-06-18 DIAGNOSIS — Z7982 Long term (current) use of aspirin: Secondary | ICD-10-CM | POA: Insufficient documentation

## 2023-06-18 DIAGNOSIS — Z794 Long term (current) use of insulin: Secondary | ICD-10-CM | POA: Diagnosis not present

## 2023-06-18 DIAGNOSIS — I130 Hypertensive heart and chronic kidney disease with heart failure and stage 1 through stage 4 chronic kidney disease, or unspecified chronic kidney disease: Secondary | ICD-10-CM | POA: Insufficient documentation

## 2023-06-18 DIAGNOSIS — Z85841 Personal history of malignant neoplasm of brain: Secondary | ICD-10-CM | POA: Insufficient documentation

## 2023-06-18 DIAGNOSIS — I2510 Atherosclerotic heart disease of native coronary artery without angina pectoris: Secondary | ICD-10-CM | POA: Diagnosis not present

## 2023-06-18 DIAGNOSIS — R7401 Elevation of levels of liver transaminase levels: Secondary | ICD-10-CM | POA: Diagnosis not present

## 2023-06-18 DIAGNOSIS — Z8616 Personal history of COVID-19: Secondary | ICD-10-CM | POA: Insufficient documentation

## 2023-06-18 DIAGNOSIS — E039 Hypothyroidism, unspecified: Secondary | ICD-10-CM | POA: Insufficient documentation

## 2023-06-18 LAB — COMPREHENSIVE METABOLIC PANEL
ALT: 121 U/L — ABNORMAL HIGH (ref 0–44)
AST: 89 U/L — ABNORMAL HIGH (ref 15–41)
Albumin: 4.3 g/dL (ref 3.5–5.0)
Alkaline Phosphatase: 94 U/L (ref 38–126)
Anion gap: 12 (ref 5–15)
BUN: 27 mg/dL — ABNORMAL HIGH (ref 6–20)
CO2: 22 mmol/L (ref 22–32)
Calcium: 8.9 mg/dL (ref 8.9–10.3)
Chloride: 102 mmol/L (ref 98–111)
Creatinine, Ser: 1.78 mg/dL — ABNORMAL HIGH (ref 0.44–1.00)
GFR, Estimated: 38 mL/min — ABNORMAL LOW (ref >60)
Glucose, Bld: 122 mg/dL — ABNORMAL HIGH (ref 70–99)
Potassium: 3.6 mmol/L (ref 3.5–5.1)
Sodium: 136 mmol/L (ref 135–145)
Total Bilirubin: 0.7 mg/dL (ref 0.0–1.2)
Total Protein: 7.2 g/dL (ref 6.5–8.1)

## 2023-06-18 LAB — RESP PANEL BY RT-PCR (RSV, FLU A&B, COVID)  RVPGX2
Influenza A by PCR: NEGATIVE
Influenza B by PCR: NEGATIVE
Resp Syncytial Virus by PCR: NEGATIVE
SARS Coronavirus 2 by RT PCR: NEGATIVE

## 2023-06-18 LAB — CBC WITH DIFFERENTIAL/PLATELET
Abs Immature Granulocytes: 0.05 10*3/uL (ref 0.00–0.07)
Basophils Absolute: 0 10*3/uL (ref 0.0–0.1)
Basophils Relative: 1 %
Eosinophils Absolute: 0.1 10*3/uL (ref 0.0–0.5)
Eosinophils Relative: 2 %
HCT: 31.7 % — ABNORMAL LOW (ref 36.0–46.0)
Hemoglobin: 10.1 g/dL — ABNORMAL LOW (ref 12.0–15.0)
Immature Granulocytes: 1 %
Lymphocytes Relative: 39 %
Lymphs Abs: 2.6 10*3/uL (ref 0.7–4.0)
MCH: 30.9 pg (ref 26.0–34.0)
MCHC: 31.9 g/dL (ref 30.0–36.0)
MCV: 96.9 fL (ref 80.0–100.0)
Monocytes Absolute: 0.7 10*3/uL (ref 0.1–1.0)
Monocytes Relative: 10 %
Neutro Abs: 3.2 10*3/uL (ref 1.7–7.7)
Neutrophils Relative %: 47 %
Platelets: 159 10*3/uL (ref 150–400)
RBC: 3.27 MIL/uL — ABNORMAL LOW (ref 3.87–5.11)
RDW: 15.6 % — ABNORMAL HIGH (ref 11.5–15.5)
WBC: 6.6 10*3/uL (ref 4.0–10.5)
nRBC: 0 % (ref 0.0–0.2)

## 2023-06-18 LAB — URINALYSIS, ROUTINE W REFLEX MICROSCOPIC
Bilirubin Urine: NEGATIVE
Glucose, UA: NEGATIVE mg/dL
Hgb urine dipstick: NEGATIVE
Ketones, ur: NEGATIVE mg/dL
Leukocytes,Ua: NEGATIVE
Nitrite: NEGATIVE
Protein, ur: 30 mg/dL — AB
Specific Gravity, Urine: 1.013 (ref 1.005–1.030)
pH: 5.5 (ref 5.0–8.0)

## 2023-06-18 LAB — RAPID URINE DRUG SCREEN, HOSP PERFORMED
Amphetamines: NOT DETECTED
Barbiturates: NOT DETECTED
Benzodiazepines: NOT DETECTED
Cocaine: NOT DETECTED
Opiates: 4729.7 — AB
Tetrahydrocannabinol: NOT DETECTED

## 2023-06-18 LAB — BRAIN NATRIURETIC PEPTIDE: B Natriuretic Peptide: 112.3 pg/mL — ABNORMAL HIGH (ref 0.0–100.0)

## 2023-06-18 LAB — PREGNANCY, URINE: Preg Test, Ur: NEGATIVE

## 2023-06-18 LAB — LIPASE, BLOOD: Lipase: 24 U/L (ref 11–51)

## 2023-06-18 MED ORDER — FAMOTIDINE IN NACL 20-0.9 MG/50ML-% IV SOLN
20.0000 mg | Freq: Once | INTRAVENOUS | Status: AC
  Administered 2023-06-18: 20 mg via INTRAVENOUS
  Filled 2023-06-18: qty 50

## 2023-06-18 MED ORDER — HYDROMORPHONE HCL 1 MG/ML IJ SOLN
1.0000 mg | Freq: Once | INTRAMUSCULAR | Status: AC
  Administered 2023-06-18: 1 mg via INTRAVENOUS
  Filled 2023-06-18: qty 1

## 2023-06-18 MED ORDER — OXYCODONE HCL 5 MG PO TABS: 5.0000 mg | ORAL_TABLET | Freq: Four times a day (QID) | ORAL | 0 refills | Status: DC | PRN

## 2023-06-18 MED ORDER — SUCRALFATE 1 G PO TABS: 1.0000 g | ORAL_TABLET | Freq: Three times a day (TID) | ORAL | 0 refills | Status: DC

## 2023-06-18 MED ORDER — ONDANSETRON HCL 4 MG/2ML IJ SOLN
4.0000 mg | Freq: Once | INTRAMUSCULAR | Status: AC
  Administered 2023-06-18: 4 mg via INTRAVENOUS
  Filled 2023-06-18: qty 2

## 2023-06-18 NOTE — Discharge Instructions (Addendum)
 It was a pleasure caring for you today in the emergency department.  Please follow-up with your PCP in 1 week for recheck.  Please return to the emergency department for any worsening or worrisome symptoms.

## 2023-06-18 NOTE — ED Triage Notes (Signed)
 Pt presents via POV c/o RUQ abd pain in epigastric area. Reports has had the pain for awhile but has become worse today.

## 2023-06-18 NOTE — ED Notes (Signed)
Patient provided with water and ice chips for PO challenge.

## 2023-06-18 NOTE — ED Provider Notes (Signed)
 [place] EMERGENCY DEPARTMENT AT [facility] Provider Note  CSN: [id number] Arrival date & time: 06/18/23 0219  Chief Complaint(s) Abdominal Pain  HPI Jennifer [name] is a 33 y.o. female with past medical history as below, significant for CKD, gastroparesis, CHF, chronic pancreatitis, PCOS, peripheral neuropathy, CAD, astrocytoma who presents to the ED with complaint of epigastric pain.  Patient reports worsening of her chronic abdominal pain this evening after eating a bowl of cereal.  Epigastric pain, aching, cramping, sharp at times. Not radiating.  No nausea or vomiting.  No fevers or chills, no change to typical diet or medications.  No change in bowel or bladder function, no BRBPR or melena.  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   CHF (congestive heart failure) (HCC)    Cholesterosis    CKD (chronic kidney disease) stage 4, GFR 15-29 ml/min (HCC) 05/13/2021   DM (diabetes mellitus) (HCC) 10/10/2018   Fatty liver    HTN (hypertension) 10/10/2018   Hypothyroidism 10/10/2018   Lipoprotein deficiency    Lung disease    longevity long term   Pancreatitis    Polycystic ovary syndrome    Patient Active Problem List   Diagnosis Date Noted   Abdominal pain 05/05/2023   Pseudohyponatremia 04/10/2023   Metabolic acidosis 04/10/2023   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 coli UTI  (urinary tract infection) 06/01/2022   DKA (diabetic ketoacidosis) (HCC) 06/01/2022   ASCUS of cervix with negative high risk HPV 05/31/2022   Pancreatitis 05/29/2022   Hypokalemia 03/23/2022   COVID 03/15/2022   COVID-19 virus infection 03/14/2022   Hyponatremia 03/14/2022   Positive D dimer 03/14/2022   Stage 3b chronic kidney disease (CKD) (HCC) 03/14/2022   Pulmonary hypertension (HCC) 02/06/2022   NASH (nonalcoholic steatohepatitis) 02/06/2022   Obesity (BMI 30-39.9) 02/06/2022   Heart failure (HCC) 12/03/2021   Acute on chronic systolic CHF (congestive heart failure) (HCC) 12/02/2021   Elevated troponin 12/02/2021   Pancytopenia (HCC) 12/02/2021   Amenorrhea 12/02/2021   Hepatic steatosis 09/27/2021   Hyperosmolar hyperglycemic state (HHS) (HCC) 08/26/2021   Small intestinal bacterial overgrowth (SIBO) 08/26/2021   Lower extremity edema 08/26/2021   Hyperkalemia 08/14/2021   Hx of insulin dependent diabetes mellitus 08/14/2021   CKD (chronic kidney disease) stage 4, GFR 15-29 ml/min (HCC) 05/13/2021   Afib (HCC) 05/12/2021   Gastroparesis due to DM (HCC) 05/12/2021   Abdominal distension 05/12/2021   Chest pain 03/10/2021   Acute on chronic combined systolic and diastolic CHF (congestive heart failure) (HCC) 02/09/2021   History of astrocytoma of brain 02/09/2021   Type 2 diabetes mellitus with hyperglycemia, with long-term current use of insulin (HCC) 01/25/2021   Diarrhea    Elevated transaminase level    Orthostatic hypotension 11/28/2020   Acute combined systolic and diastolic heart failure (HCC)    Nonischemic cardiomyopathy (HCC)    Acute decompensated heart failure (HCC)    Elevated liver enzymes  Acute kidney injury superimposed on stage 3b chronic kidney disease (HCC) 11/26/2020   Intractable abdominal pain 11/23/2020   Chronic systolic CHF (congestive heart failure) (HCC) 11/23/2020   HTN (hypertension) 11/23/2020   Familial hypertriglyceridemia 11/23/2020    Prolonged QT interval 11/23/2020   Finger mass, left 08/12/2020   Nausea and vomiting 07/23/2020   Polycystic ovary syndrome 01/30/2020   Moderate aortic insufficiency 08/16/2019   Moderate mitral regurgitation 08/16/2019   Lipoprotein lipase deficiency, familial 06/19/2019   Microalbuminuria due to type 1 diabetes mellitus (HCC) 06/19/2019   Type 2 diabetes mellitus with hyperlipidemia (HCC) 10/10/2018   Hypothyroidism 10/10/2018   Home Medication(s) Prior to Admission medications   Medication Sig Start Date End Date Taking? Authorizing Provider  oxyCODONE (ROXICODONE) 5 MG immediate release tablet Take 1 tablet (5 mg total) by mouth every 6 (six) hours as needed for severe pain (pain score 7-10). 06/18/23  Yes Tanda Rockers A, DO  sucralfate (CARAFATE) 1 g tablet Take 1 tablet (1 g total) by mouth with breakfast, with lunch, and with evening meal for 7 days. 06/18/23 06/25/23 Yes Sloan Leiter, DO  amitriptyline (ELAVIL) 10 MG tablet Take 10 mg by mouth at bedtime.    [provider]  aspirin EC 81 MG EC tablet Take 1 tablet (81 mg total) by mouth daily. Swallow whole. 12/07/20   Pennie Banter, DO  azelastine (ASTELIN) 0.1 % nasal spray Place 2 sprays into both nostrils daily at 6 (six) AM.    [provider]  BLISOVI FE 1.5/30 1.5-30 MG-MCG tablet Take 1 tablet by mouth daily.    [provider]  butalbital-acetaminophen-caffeine (FIORICET) 50-325-40 MG tablet Take 1 tablet by mouth daily as needed for headache. 09/26/22   [provider]  carvedilol (COREG) 3.125 MG tablet Take 1 tablet (3.125 mg total) by mouth 2 (two) times daily. 10/04/22   [name], [name], MD  CRANBERRY CONCENTRATE PO Take 2 tablets by mouth 2 (two) times daily.    [provider]  CREON 36000-114000 units CPEP capsule Take 1 capsule (36,000 Units total) by mouth 3 (three) times daily. 05/10/23   Rai, Delene Ruffini, MD  diclofenac (VOLTAREN) 75 MG EC tablet Take 1 tablet (75  mg total) by mouth 2 (two) times daily. 06/09/23   Candelaria Stagers, DPM  doxycycline (VIBRA-TABS) 100 MG tablet Take 1 tablet (100 mg total) by mouth daily. Take with food. 06/06/23 09/04/23  Terri Piedra, DO  DULoxetine (CYMBALTA) 60 MG capsule Take 60 mg by mouth at bedtime. 09/26/22   [provider]  ergocalciferol (VITAMIN D2) 1.25 MG (50000 UT) capsule Take 50,000 Units by mouth every 7 (seven) days. Every Sunday    [provider]  Evolocumab (REPATHA) 140 MG/ML SOSY Inject 140 mg into the skin every 14 (fourteen) days. Saturdays    [provider]  fenofibrate 54 MG tablet Take 1 tablet (54 mg total) by mouth daily. Patient taking differently: Take 54 mg by mouth at bedtime. 12/01/22   [name], [name], MD  hydrALAZINE (APRESOLINE) 50 MG tablet Take 1 tablet (50 mg total) by mouth 3 (three) times daily. 05/10/23   Rai, Ripudeep K, MD  insulin lispro (HUMALOG) 100 UNIT/ML injection Inject 30-40 Units into the skin See admin instructions. Inject subcutaneously 40 units with breakfast, 30 units with lunch and 30 units with dinner    [provider]  Insulin Pen Needle 32G X 4 MM MISC 1 Device by Does not apply route  QID. For use with insulin pens 02/13/22   [name], MD  insulin regular human CONCENTRATED (HUMULIN R U-500 KWIKPEN) 500 UNIT/ML KwikPen Inject 185 Units into the skin 3 (three) times daily with meals. Patient taking differently: Inject 195 Units into the skin 2 (two) times daily with a meal. Inject subcutaneously 195 units with breakfast and lunch 09/20/22 01/09/24  [name], MD  ivabradine (CORLANOR) 7.5 MG TABS tablet Take 1 tablet (7.5 mg total) by mouth 2 (two) times daily with a meal. Patient taking differently: Take 7.5 mg by mouth at bedtime. 01/31/23   Milford, Anderson Malta, FNP  levocetirizine (XYZAL) 5 MG tablet Take 5 mg by mouth daily. 03/05/22   [provider]  levothyroxine (SYNTHROID) 137 MCG tablet Take 137 mcg by  mouth daily. 04/12/23   [provider]  metoCLOPramide (REGLAN) 5 MG tablet Take 1 tablet (5 mg total) by mouth every 8 (eight) hours as needed for nausea or vomiting. 05/10/23 05/09/24  Rai, Ripudeep K, MD  pantoprazole (PROTONIX) 40 MG tablet Take 40 mg by mouth daily. 12/24/19   [provider]  polyethylene glycol (MIRALAX / GLYCOLAX) 17 g packet Take 17 g by mouth daily as needed for mild constipation. Also available OTC 05/10/23   Rai, Ripudeep K, MD  pregabalin (LYRICA) 300 MG capsule Take 300 mg by mouth 2 (two) times daily.    [provider]  prochlorperazine (COMPAZINE) 10 MG tablet Take 10 mg by mouth at bedtime.    [provider]  rifaximin (XIFAXAN) 550 MG TABS tablet Take 1 tablet (550 mg total) by mouth 3 (three) times daily. 05/10/23   Rai, Delene Ruffini, MD  rosuvastatin (CRESTOR) 40 MG tablet Take 40 mg by mouth daily. 08/09/21   [provider]  Safety Seal Miscellaneous MISC Hormonic Hair Solution with minoxidil USP 7% and finasteride USP 0.05% - apply to affected areas daily every morning. 06/06/23   Terri Piedra, DO  sodium bicarbonate 650 MG tablet Take 2 tablets (1,300 mg total) by mouth 2 (two) times daily. 05/10/23   Rai, Delene Ruffini, MD  SUMAtriptan (IMITREX) 25 MG tablet Take 25 mg by mouth every 2 (two) hours as needed. 01/30/23   [provider]  tamsulosin (FLOMAX) 0.4 MG CAPS capsule Take 1 capsule (0.4 mg total) by mouth daily. 05/10/23   Rai, Delene Ruffini, MD  topiramate (TOPAMAX) 50 MG tablet Take 50 mg by mouth 2 (two) times daily.    [provider]  torsemide (DEMADEX) 20 MG tablet Take 2 tablets (40 mg total) by mouth daily. May take 1 additional tablet as needed Patient taking differently: Take 20-40 mg by mouth See admin instructions. Take 40mg  daily. May take 1 additional tablet as needed for swelling. 10/05/22   [name], [name], MD  tretinoin (RETIN-A) 0.05 % cream Apply 1 application topically at  bedtime. 01/17/21   [provider]  Past Surgical History Past Surgical History:  Procedure Laterality Date   ABDOMINAL SURGERY     pt states during miscarriage got her intestine   BRAIN SURGERY     EYE MUSCLE SURGERY Right 03/28/2014   PRESSURE SENSOR/CARDIOMEMS N/A 12/06/2021   Procedure: PRESSURE SENSOR/CARDIOMEMS;  Surgeon: Dolores Patty, MD;  Location: [facility];  Service: Cardiovascular;  Laterality: N/A;   RIGHT HEART CATH N/A 08/05/2021   Procedure: RIGHT HEART CATH;  Surgeon: Dolores Patty, MD;  Location: [facility];  Service: Cardiovascular;  Laterality: N/A;   RIGHT HEART CATH N/A 12/06/2021   Procedure: RIGHT HEART CATH;  Surgeon: Dolores Patty, MD;  Location: [facility];  Service: Cardiovascular;  Laterality: N/A;   RIGHT/LEFT HEART CATH AND CORONARY ANGIOGRAPHY N/A 12/03/2020   Procedure: RIGHT/LEFT HEART CATH AND CORONARY ANGIOGRAPHY;  Surgeon: Yates Decamp, MD;  Location: [facility];  Service: Cardiovascular;  Laterality: N/A;   VENTRICULOSTOMY  03/28/1997   Family History Family History  Problem Relation Age of Onset   Diabetes Mother    Hypertension Mother    Hyperlipidemia Mother    Thyroid disease Mother    Hypertension Father    Diabetes Father    Pancreatic cancer Paternal Aunt    Pancreatic cancer Paternal Uncle    Colon cancer Neg Hx    Esophageal cancer Neg Hx    Stomach cancer Neg Hx     Social History Social History   Tobacco Use   Smoking status: Never   Smokeless tobacco: Never  Vaping Use   Vaping status: Never Used  Substance Use Topics   Alcohol use: Never   Drug use: Never   Allergies Icosapent ethyl (epa ethyl ester) (fish), Ketamine, [place], Morphine, Penicillins, Clarithromycin, Erythromycin, Fentanyl, Penicillin v, Shellfish allergy, and  Prednisone  Review of Systems A thorough review of systems was obtained and all systems are negative except as noted in the HPI and PMH.   Physical Exam Vital Signs  I have reviewed the triage vital signs BP 131/71   Pulse 82   Temp 97.6 F (36.4 C) (Oral)   Resp 16   LMP 06/10/2023 (Exact Date)   SpO2 95%  Physical Exam Vitals and nursing note reviewed.  Constitutional:      General: [name] not in acute distress.    Appearance: Normal appearance. [name] well-developed. [name] obese.  HENT:     Head: Normocephalic and atraumatic.     Right Ear: External ear normal.     Left Ear: External ear normal.     Nose: Nose normal.     Mouth/Throat:     Mouth: Mucous membranes are moist.  Eyes:     General: No scleral icterus.       Right eye: No discharge.        Left eye: No discharge.  Cardiovascular:     Rate and Rhythm: Normal rate and regular rhythm.     Pulses: Normal pulses.     Heart sounds: Normal heart sounds.  Pulmonary:     Effort: Pulmonary effort is normal. No respiratory distress.     Breath sounds: Normal breath sounds. No stridor.  Abdominal:     General: Abdomen is flat. There is no distension.     Palpations: Abdomen is soft.     Tenderness: There is no abdominal tenderness. There is no guarding or rebound.  Musculoskeletal:     Cervical back: No rigidity.  Right lower leg: No edema.     Left lower leg: No edema.  Skin:    General: Skin is warm and dry.     Capillary Refill: Capillary refill takes less than 2 seconds.  Neurological:     Mental Status: [name] alert.  Psychiatric:        Mood and Affect: Mood normal.        Behavior: Behavior normal. Behavior is cooperative.     ED Results and Treatments Labs (all labs ordered are listed, but only abnormal results are displayed) Labs Reviewed  COMPREHENSIVE METABOLIC PANEL - Abnormal; Notable for the following components:      Result Value   Glucose, Bld 122 (*)    BUN 27 (*)    Creatinine,  Ser 1.78 (*)    AST 89 (*)    ALT 121 (*)    GFR, Estimated 38 (*)    All other components within normal limits  URINALYSIS, ROUTINE W REFLEX MICROSCOPIC - Abnormal; Notable for the following components:   Protein, ur 30 (*)    Bacteria, UA RARE (*)    All other components within normal limits  CBC WITH DIFFERENTIAL/PLATELET - Abnormal; Notable for the following components:   RBC 3.27 (*)    Hemoglobin 10.1 (*)    HCT 31.7 (*)    RDW 15.6 (*)    All other components within normal limits  RAPID URINE DRUG SCREEN, HOSP PERFORMED - Abnormal; Notable for the following components:   Opiates 4,729.7 (*)    All other components within normal limits  BRAIN NATRIURETIC PEPTIDE - Abnormal; Notable for the following components:   B Natriuretic Peptide 112.3 (*)    All other components within normal limits  RESP PANEL BY RT-PCR (RSV, FLU A&B, COVID)  RVPGX2  LIPASE, BLOOD  PREGNANCY, URINE                                                                                                                          Radiology No results found.  Pertinent labs & imaging results that were available during my care of the patient were reviewed by me and considered in my medical decision making (see MDM for details).  Medications Ordered in ED Medications  HYDROmorphone (DILAUDID) injection 1 mg (1 mg Intravenous Given 06/18/23 0321)  ondansetron (ZOFRAN) injection 4 mg (4 mg Intravenous Given 06/18/23 0319)  famotidine (PEPCID) IVPB 20 mg premix (0 mg Intravenous Stopped 06/18/23 0423)  Procedures Procedures  (including critical care time)  Medical Decision Making / ED Course    Medical Decision Making:    Jennifer [name] is a 33 y.o. female with past medical history as below, significant for CKD, gastroparesis, CHF, chronic pancreatitis, PCOS, peripheral  neuropathy, CAD, astrocytoma who presents to the ED with complaint of epigastric pain.. The complaint involves an extensive differential diagnosis and also carries with it a high risk of complications and morbidity.  Serious etiology was considered. Ddx includes but is not limited to: Differential diagnosis includes but is not exclusive to acute cholecystitis, intrathoracic causes for epigastric abdominal pain, gastritis, duodenitis, pancreatitis, small bowel or large bowel obstruction, abdominal aortic aneurysm, hernia, gastritis, etc.   Complete initial physical exam performed, notably the patient was in no distress, resting on stretcher.    Reviewed and confirmed nursing documentation for past medical history, family history, social history.  Vital signs reviewed.     Clinical Course as of 06/18/23 0506  Wynelle Link Jun 18, 2023  0306 Hemoglobin(!): 10.1 Similar to prior  [SG]  0337 Creatinine(!): 1.78 Improved from prior [SG]  0427 Feeling better [SG]  0504 AST(!): 89 [SG]  0504 ALT(!): 121 Chronically elevated lft's, she has no jaundice or fever, tbili is wnl, no RUQ pain. No vomiting.  [SG]    Clinical Course User Index [SG] Sloan Leiter, DO    Brief summary: 33 year old female history as above, here with recurrent epigastric abdominal pain.  Her exam is reassuring, abdomen is nontender, nonperitoneal.  [name] HDS.  Will check screening labs, give analgesics, Pepcid.  Reassess.   Labs reviewed, these are stable.  [name] feeling much better on recheck, she is tolerating p.o. without difficulty.  No nausea or vomiting.  [name] HDS.  Not feel emergent CT imaging is warranted at this time given benign abdominal exam and stable labs.  Encourage follow-up with PCP.  The patient improved significantly and was discharged in stable condition. Detailed discussions were had with the patient/guardian regarding current findings, and need for close f/u with PCP or on call doctor. The patient/guardian  has been instructed to return immediately if the symptoms worsen in any way for re-evaluation. Patient/guardian verbalized understanding and is in agreement with current care plan. All questions answered prior to discharge.               Additional history obtained: -Additional history obtained from na -External records from outside source obtained and reviewed including: Chart review including previous notes, labs, imaging, consultation notes including  Pdmp reviewed, prior labs/imaging/ed visits   Lab Tests: -I ordered, reviewed, and interpreted labs.   The pertinent results include:   Labs Reviewed  COMPREHENSIVE METABOLIC PANEL - Abnormal; Notable for the following components:      Result Value   Glucose, Bld 122 (*)    BUN 27 (*)    Creatinine, Ser 1.78 (*)    AST 89 (*)    ALT 121 (*)    GFR, Estimated 38 (*)    All other components within normal limits  URINALYSIS, ROUTINE W REFLEX MICROSCOPIC - Abnormal; Notable for the following components:   Protein, ur 30 (*)    Bacteria, UA RARE (*)    All other components within normal limits  CBC WITH DIFFERENTIAL/PLATELET - Abnormal; Notable for the following components:   RBC 3.27 (*)    Hemoglobin 10.1 (*)    HCT 31.7 (*)    RDW 15.6 (*)    All other  components within normal limits  RAPID URINE DRUG SCREEN, HOSP PERFORMED - Abnormal; Notable for the following components:   Opiates 4,729.7 (*)    All other components within normal limits  BRAIN NATRIURETIC PEPTIDE - Abnormal; Notable for the following components:   B Natriuretic Peptide 112.3 (*)    All other components within normal limits  RESP PANEL BY RT-PCR (RSV, FLU A&B, COVID)  RVPGX2  LIPASE, BLOOD  PREGNANCY, URINE    Notable for stable labs  EKG   EKG Interpretation Date/Time:  Sunday June 18 2023 02:29:50 EDT Ventricular Rate:  83 PR Interval:  127 QRS Duration:  93 QT Interval:  417 QTC Calculation: 490 R Axis:   56  Text  Interpretation: Sinus rhythm Borderline ST depression, lateral leads Borderline prolonged QT interval no stemi Confirmed by Tanda Rockers ([id number]) on 06/18/2023 2:35:04 AM         Imaging Studies ordered: na   Medicines ordered and prescription drug management: Meds ordered this encounter  Medications   HYDROmorphone (DILAUDID) injection 1 mg   ondansetron (ZOFRAN) injection 4 mg   famotidine (PEPCID) IVPB 20 mg premix   sucralfate (CARAFATE) 1 g tablet    Sig: Take 1 tablet (1 g total) by mouth with breakfast, with lunch, and with evening meal for 7 days.    Dispense:  21 tablet    Refill:  0   oxyCODONE (ROXICODONE) 5 MG immediate release tablet    Sig: Take 1 tablet (5 mg total) by mouth every 6 (six) hours as needed for severe pain (pain score 7-10).    Dispense:  5 tablet    Refill:  0    -I have reviewed the patients home medicines and have made adjustments as needed   Consultations Obtained: na   Cardiac Monitoring: Continuous pulse oximetry interpreted by myself, 99% on RA.    Social Determinants of Health:  Diagnosis or treatment significantly limited by social determinants of health: obesity   Reevaluation: After the interventions noted above, I reevaluated the patient and found that they have improved  Co morbidities that complicate the patient evaluation  Past Medical History:  Diagnosis Date   Afib (HCC) 05/12/2021   Brain tumor (HCC) 03/29/1995   astrocytoma   CHF (congestive heart failure) (HCC)    Cholesterosis    CKD (chronic kidney disease) stage 4, GFR 15-29 ml/min (HCC) 05/13/2021   DM (diabetes mellitus) (HCC) 10/10/2018   Fatty liver    HTN (hypertension) 10/10/2018   Hypothyroidism 10/10/2018   Lipoprotein deficiency    Lung disease    longevity long term   Pancreatitis    Polycystic ovary syndrome       Dispostion: Disposition decision including need for hospitalization was considered, and patient discharged from emergency  department.    Final Clinical Impression(s) / ED Diagnoses Final diagnoses:  Epigastric pain        Sloan Leiter, DO 06/18/23 4098

## 2023-06-21 ENCOUNTER — Encounter (HOSPITAL_BASED_OUTPATIENT_CLINIC_OR_DEPARTMENT_OTHER): Payer: Self-pay

## 2023-06-21 ENCOUNTER — Emergency Department (HOSPITAL_BASED_OUTPATIENT_CLINIC_OR_DEPARTMENT_OTHER)

## 2023-06-21 ENCOUNTER — Other Ambulatory Visit: Payer: Self-pay

## 2023-06-21 ENCOUNTER — Emergency Department (HOSPITAL_BASED_OUTPATIENT_CLINIC_OR_DEPARTMENT_OTHER)
Admission: EM | Admit: 2023-06-21 | Discharge: 2023-06-21 | Disposition: A | Discharge status: Home / Self Care | Attending: Emergency Medicine | Admitting: Emergency Medicine

## 2023-06-21 DIAGNOSIS — N184 Chronic kidney disease, stage 4 (severe): Secondary | ICD-10-CM | POA: Insufficient documentation

## 2023-06-21 DIAGNOSIS — I130 Hypertensive heart and chronic kidney disease with heart failure and stage 1 through stage 4 chronic kidney disease, or unspecified chronic kidney disease: Secondary | ICD-10-CM | POA: Diagnosis not present

## 2023-06-21 DIAGNOSIS — E039 Hypothyroidism, unspecified: Secondary | ICD-10-CM | POA: Insufficient documentation

## 2023-06-21 DIAGNOSIS — Z79899 Other long term (current) drug therapy: Secondary | ICD-10-CM | POA: Insufficient documentation

## 2023-06-21 DIAGNOSIS — R162 Hepatomegaly with splenomegaly, not elsewhere classified: Secondary | ICD-10-CM | POA: Diagnosis not present

## 2023-06-21 DIAGNOSIS — E1122 Type 2 diabetes mellitus with diabetic chronic kidney disease: Secondary | ICD-10-CM | POA: Diagnosis not present

## 2023-06-21 DIAGNOSIS — R109 Unspecified abdominal pain: Secondary | ICD-10-CM | POA: Diagnosis not present

## 2023-06-21 DIAGNOSIS — Z7982 Long term (current) use of aspirin: Secondary | ICD-10-CM | POA: Diagnosis not present

## 2023-06-21 DIAGNOSIS — K760 Fatty (change of) liver, not elsewhere classified: Secondary | ICD-10-CM | POA: Diagnosis not present

## 2023-06-21 DIAGNOSIS — E1165 Type 2 diabetes mellitus with hyperglycemia: Secondary | ICD-10-CM | POA: Diagnosis not present

## 2023-06-21 DIAGNOSIS — R1013 Epigastric pain: Secondary | ICD-10-CM | POA: Insufficient documentation

## 2023-06-21 DIAGNOSIS — I5022 Chronic systolic (congestive) heart failure: Secondary | ICD-10-CM | POA: Diagnosis not present

## 2023-06-21 DIAGNOSIS — Z8616 Personal history of COVID-19: Secondary | ICD-10-CM | POA: Diagnosis not present

## 2023-06-21 DIAGNOSIS — Z794 Long term (current) use of insulin: Secondary | ICD-10-CM | POA: Diagnosis not present

## 2023-06-21 LAB — COMPREHENSIVE METABOLIC PANEL
ALT: 114 U/L — ABNORMAL HIGH (ref 0–44)
AST: 129 U/L — ABNORMAL HIGH (ref 15–41)
Albumin: 4.2 g/dL (ref 3.5–5.0)
Alkaline Phosphatase: 91 U/L (ref 38–126)
Anion gap: 13 (ref 5–15)
BUN: 35 mg/dL — ABNORMAL HIGH (ref 6–20)
CO2: 21 mmol/L — ABNORMAL LOW (ref 22–32)
Calcium: 9.1 mg/dL (ref 8.9–10.3)
Chloride: 101 mmol/L (ref 98–111)
Creatinine, Ser: 1.71 mg/dL — ABNORMAL HIGH (ref 0.44–1.00)
GFR, Estimated: 40 mL/min — ABNORMAL LOW (ref >60)
Glucose, Bld: 235 mg/dL — ABNORMAL HIGH (ref 70–99)
Potassium: 3.9 mmol/L (ref 3.5–5.1)
Sodium: 135 mmol/L (ref 135–145)
Total Bilirubin: 0.7 mg/dL (ref 0.0–1.2)
Total Protein: 7.2 g/dL (ref 6.5–8.1)

## 2023-06-21 LAB — CBG MONITORING, ED
Glucose-Capillary: 34 mg/dL — CL (ref 70–99)
Glucose-Capillary: 72 mg/dL (ref 70–99)
Glucose-Capillary: 141 mg/dL — ABNORMAL HIGH (ref 70–99)
Glucose-Capillary: 63 mg/dL — ABNORMAL LOW (ref 70–99)
Glucose-Capillary: 118 mg/dL — ABNORMAL HIGH (ref 70–99)

## 2023-06-21 LAB — CBC WITH DIFFERENTIAL/PLATELET
Abs Immature Granulocytes: 0.06 10*3/uL (ref 0.00–0.07)
Basophils Absolute: 0 10*3/uL (ref 0.0–0.1)
Basophils Relative: 1 %
Eosinophils Absolute: 0.1 10*3/uL (ref 0.0–0.5)
Eosinophils Relative: 1 %
HCT: 33.5 % — ABNORMAL LOW (ref 36.0–46.0)
Hemoglobin: 10.9 g/dL — ABNORMAL LOW (ref 12.0–15.0)
Immature Granulocytes: 1 %
Lymphocytes Relative: 36 %
Lymphs Abs: 2 10*3/uL (ref 0.7–4.0)
MCH: 31.8 pg (ref 26.0–34.0)
MCHC: 32.5 g/dL (ref 30.0–36.0)
MCV: 97.7 fL (ref 80.0–100.0)
Monocytes Absolute: 0.4 10*3/uL (ref 0.1–1.0)
Monocytes Relative: 6 %
Neutro Abs: 3.2 10*3/uL (ref 1.7–7.7)
Neutrophils Relative %: 55 %
Platelets: 165 10*3/uL (ref 150–400)
RBC: 3.43 MIL/uL — ABNORMAL LOW (ref 3.87–5.11)
RDW: 16 % — ABNORMAL HIGH (ref 11.5–15.5)
WBC: 5.7 10*3/uL (ref 4.0–10.5)
nRBC: 0 % (ref 0.0–0.2)

## 2023-06-21 LAB — TRIGLYCERIDES: Triglycerides: 1766 mg/dL — ABNORMAL HIGH (ref <150)

## 2023-06-21 LAB — LIPASE, BLOOD: Lipase: 34 U/L (ref 11–51)

## 2023-06-21 MED ORDER — SODIUM CHLORIDE 0.9 % IV BOLUS
1000.0000 mL | Freq: Once | INTRAVENOUS | Status: AC
  Administered 2023-06-21: 1000 mL via INTRAVENOUS

## 2023-06-21 MED ORDER — ONDANSETRON HCL 4 MG/2ML IJ SOLN
4.0000 mg | Freq: Once | INTRAMUSCULAR | Status: AC
  Administered 2023-06-21: 4 mg via INTRAVENOUS
  Filled 2023-06-21: qty 2

## 2023-06-21 MED ORDER — DIPHENHYDRAMINE HCL 50 MG/ML IJ SOLN
25.0000 mg | Freq: Once | INTRAMUSCULAR | Status: AC
  Administered 2023-06-21: 25 mg via INTRAVENOUS
  Filled 2023-06-21: qty 1

## 2023-06-21 MED ORDER — HYDROMORPHONE HCL 1 MG/ML IJ SOLN
0.5000 mg | Freq: Once | INTRAMUSCULAR | Status: AC
  Administered 2023-06-21: 0.5 mg via INTRAVENOUS
  Filled 2023-06-21: qty 1

## 2023-06-21 MED ORDER — IOHEXOL 300 MG/ML  SOLN
100.0000 mL | Freq: Once | INTRAMUSCULAR | Status: AC | PRN
Start: 2023-06-21 — End: 2023-06-21
  Administered 2023-06-21: 75 mL via INTRAVENOUS

## 2023-06-21 MED ORDER — DEXTROSE 50 % IV SOLN
12.5000 g | Freq: Once | INTRAVENOUS | Status: AC
  Administered 2023-06-21: 12.5 g via INTRAVENOUS
  Filled 2023-06-21: qty 50

## 2023-06-21 MED ORDER — PROMETHAZINE HCL 25 MG PO TABS: 25.0000 mg | ORAL_TABLET | Freq: Four times a day (QID) | ORAL | 0 refills | Status: DC | PRN

## 2023-06-21 NOTE — ED Provider Notes (Signed)
 [place] EMERGENCY DEPARTMENT AT [facility] Provider Note  CSN: [id number] Arrival date & time: 06/21/23 1621  Chief Complaint(s) Abdominal Pain and Emesis  HPI Jennifer [name] is a 33 y.o. female history of CHF, lipoprotein deficiency, CKD, diabetes, hyperlipidemia, recurrent pancreatitis presenting to the emergency department with abdominal pain.  Patient reports that she has had abdominal pain for the past few days.  Initially had some epigastric pain, came to the ER on the 23rd, had reassuring workup and symptoms improved and went home.  She reports persistent pain, now has developed some mild nausea at home with vomiting x 3.  No hematemesis, no melena.  Pain does not radiate.  No fevers or chills.  No urinary symptoms.   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   CHF (congestive heart failure) (HCC)    Cholesterosis    CKD (chronic kidney disease) stage 4, GFR 15-29 ml/min (HCC) 05/13/2021   DM (diabetes mellitus) (HCC) 10/10/2018   Fatty liver    HTN (hypertension) 10/10/2018   Hypothyroidism 10/10/2018   Lipoprotein deficiency    Lung disease    longevity long term   Pancreatitis    Polycystic ovary syndrome    Patient Active Problem List   Diagnosis Date Noted   Abdominal pain 05/05/2023   Pseudohyponatremia 04/10/2023   Metabolic acidosis 04/10/2023   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 coli UTI (urinary tract infection) 06/01/2022   DKA (diabetic ketoacidosis) (HCC) 06/01/2022   ASCUS of cervix with negative high risk HPV 05/31/2022   Pancreatitis 05/29/2022   Hypokalemia 03/23/2022   COVID 03/15/2022   COVID-19 virus infection 03/14/2022   Hyponatremia 03/14/2022   Positive D dimer 03/14/2022   Stage 3b chronic kidney disease (CKD) (HCC) 03/14/2022   Pulmonary hypertension (HCC) 02/06/2022   NASH (nonalcoholic steatohepatitis) 02/06/2022   Obesity (BMI 30-39.9) 02/06/2022   Heart failure (HCC) 12/03/2021   Acute on chronic systolic CHF (congestive heart failure) (HCC) 12/02/2021   Elevated troponin 12/02/2021   Pancytopenia (HCC) 12/02/2021   Amenorrhea 12/02/2021   Hepatic steatosis 09/27/2021   Hyperosmolar hyperglycemic state (HHS) (HCC) 08/26/2021   Small intestinal bacterial overgrowth (SIBO) 08/26/2021   Lower extremity edema 08/26/2021   Hyperkalemia 08/14/2021   Hx of insulin dependent diabetes mellitus 08/14/2021   CKD (chronic kidney disease) stage 4, GFR 15-29 ml/min (HCC) 05/13/2021   Afib (HCC) 05/12/2021   Gastroparesis due to DM (HCC) 05/12/2021   Abdominal distension 05/12/2021   Chest pain 03/10/2021   Acute on chronic combined systolic and diastolic CHF (congestive heart failure) (HCC) 02/09/2021   History of astrocytoma of brain 02/09/2021   Type 2 diabetes mellitus with hyperglycemia, with long-term current use of insulin (HCC) 01/25/2021   Diarrhea    Elevated transaminase level    Orthostatic hypotension 11/28/2020   Acute combined systolic and diastolic heart failure (HCC)    Nonischemic cardiomyopathy (HCC)    Acute decompensated heart failure (HCC)  Elevated liver enzymes    Acute kidney injury superimposed on stage 3b chronic kidney disease (HCC) 11/26/2020   Intractable abdominal pain 11/23/2020   Chronic systolic CHF (congestive heart failure) (HCC) 11/23/2020   HTN (hypertension) 11/23/2020   Familial  hypertriglyceridemia 11/23/2020   Prolonged QT interval 11/23/2020   Finger mass, left 08/12/2020   Nausea and vomiting 07/23/2020   Polycystic ovary syndrome 01/30/2020   Moderate aortic insufficiency 08/16/2019   Moderate mitral regurgitation 08/16/2019   Lipoprotein lipase deficiency, familial 06/19/2019   Microalbuminuria due to type 1 diabetes mellitus (HCC) 06/19/2019   Type 2 diabetes mellitus with hyperlipidemia (HCC) 10/10/2018   Hypothyroidism 10/10/2018   Home Medication(s) Prior to Admission medications   Medication Sig Start Date End Date Taking? Authorizing Provider  promethazine (PHENERGAN) 25 MG tablet Take 1 tablet (25 mg total) by mouth every 6 (six) hours as needed for nausea or vomiting. 06/21/23  Yes Lonell Grandchild, MD  amitriptyline (ELAVIL) 10 MG tablet Take 10 mg by mouth at bedtime.    [provider]  aspirin EC 81 MG EC tablet Take 1 tablet (81 mg total) by mouth daily. Swallow whole. 12/07/20   Pennie Banter, DO  azelastine (ASTELIN) 0.1 % nasal spray Place 2 sprays into both nostrils daily at 6 (six) AM.    [provider]  BLISOVI FE 1.5/30 1.5-30 MG-MCG tablet Take 1 tablet by mouth daily.    [provider]  butalbital-acetaminophen-caffeine (FIORICET) 50-325-40 MG tablet Take 1 tablet by mouth daily as needed for headache. 09/26/22   [provider]  carvedilol (COREG) 3.125 MG tablet Take 1 tablet (3.125 mg total) by mouth 2 (two) times daily. 10/04/22   [name], [name], MD  CRANBERRY CONCENTRATE PO Take 2 tablets by mouth 2 (two) times daily.    [provider]  CREON 36000-114000 units CPEP capsule Take 1 capsule (36,000 Units total) by mouth 3 (three) times daily. 05/10/23   Rai, Delene Ruffini, MD  diclofenac (VOLTAREN) 75 MG EC tablet Take 1 tablet (75 mg total) by mouth 2 (two) times daily. 06/09/23   Candelaria Stagers, DPM  doxycycline (VIBRA-TABS) 100 MG tablet Take 1 tablet (100 mg total) by mouth daily.  Take with food. 06/06/23 09/04/23  Terri Piedra, DO  DULoxetine (CYMBALTA) 60 MG capsule Take 60 mg by mouth at bedtime. 09/26/22   [provider]  ergocalciferol (VITAMIN D2) 1.25 MG (50000 UT) capsule Take 50,000 Units by mouth every 7 (seven) days. Every Sunday    [provider]  Evolocumab (REPATHA) 140 MG/ML SOSY Inject 140 mg into the skin every 14 (fourteen) days. Saturdays    [provider]  fenofibrate 54 MG tablet Take 1 tablet (54 mg total) by mouth daily. Patient taking differently: Take 54 mg by mouth at bedtime. 12/01/22   [name], [name], MD  hydrALAZINE (APRESOLINE) 50 MG tablet Take 1 tablet (50 mg total) by mouth 3 (three) times daily. 05/10/23   Rai, Ripudeep K, MD  insulin lispro (HUMALOG) 100 UNIT/ML injection Inject 30-40 Units into the skin See admin instructions. Inject subcutaneously 40 units with breakfast, 30 units with lunch and 30 units with dinner    [provider]  Insulin Pen Needle 32G X 4 MM MISC 1 Device by Does not apply route QID. For use with insulin pens 02/13/22   [name], MD  insulin regular human CONCENTRATED (HUMULIN R U-500 KWIKPEN) 500 UNIT/ML KwikPen Inject 185 Units into the  skin 3 (three) times daily with meals. Patient taking differently: Inject 195 Units into the skin 2 (two) times daily with a meal. Inject subcutaneously 195 units with breakfast and lunch 09/20/22 01/09/24  [name], MD  ivabradine (CORLANOR) 7.5 MG TABS tablet Take 1 tablet (7.5 mg total) by mouth 2 (two) times daily with a meal. Patient taking differently: Take 7.5 mg by mouth at bedtime. 01/31/23   Milford, Anderson Malta, FNP  levocetirizine (XYZAL) 5 MG tablet Take 5 mg by mouth daily. 03/05/22   [provider]  levothyroxine (SYNTHROID) 137 MCG tablet Take 137 mcg by mouth daily. 04/12/23   [provider]  metoCLOPramide (REGLAN) 5 MG tablet Take 1 tablet (5 mg total) by mouth every 8 (eight) hours as needed  for nausea or vomiting. 05/10/23 05/09/24  Rai, Delene Ruffini, MD  oxyCODONE (ROXICODONE) 5 MG immediate release tablet Take 1 tablet (5 mg total) by mouth every 6 (six) hours as needed for severe pain (pain score 7-10). 06/18/23   Tanda Rockers A, DO  pantoprazole (PROTONIX) 40 MG tablet Take 40 mg by mouth daily. 12/24/19   [provider]  polyethylene glycol (MIRALAX / GLYCOLAX) 17 g packet Take 17 g by mouth daily as needed for mild constipation. Also available OTC 05/10/23   Rai, Ripudeep K, MD  pregabalin (LYRICA) 300 MG capsule Take 300 mg by mouth 2 (two) times daily.    [provider]  prochlorperazine (COMPAZINE) 10 MG tablet Take 10 mg by mouth at bedtime.    [provider]  rifaximin (XIFAXAN) 550 MG TABS tablet Take 1 tablet (550 mg total) by mouth 3 (three) times daily. 05/10/23   Rai, Delene Ruffini, MD  rosuvastatin (CRESTOR) 40 MG tablet Take 40 mg by mouth daily. 08/09/21   [provider]  Safety Seal Miscellaneous MISC Hormonic Hair Solution with minoxidil USP 7% and finasteride USP 0.05% - apply to affected areas daily every morning. 06/06/23   Terri Piedra, DO  sodium bicarbonate 650 MG tablet Take 2 tablets (1,300 mg total) by mouth 2 (two) times daily. 05/10/23   Rai, Delene Ruffini, MD  sucralfate (CARAFATE) 1 g tablet Take 1 tablet (1 g total) by mouth with breakfast, with lunch, and with evening meal for 7 days. 06/18/23 06/25/23  Sloan Leiter, DO  SUMAtriptan (IMITREX) 25 MG tablet Take 25 mg by mouth every 2 (two) hours as needed. 01/30/23   [provider]  tamsulosin (FLOMAX) 0.4 MG CAPS capsule Take 1 capsule (0.4 mg total) by mouth daily. 05/10/23   Rai, Delene Ruffini, MD  topiramate (TOPAMAX) 50 MG tablet Take 50 mg by mouth 2 (two) times daily.    [provider]  torsemide (DEMADEX) 20 MG tablet Take 2 tablets (40 mg total) by mouth daily. May take 1 additional tablet as needed Patient taking differently: Take 20-40 mg by mouth  See admin instructions. Take 40mg  daily. May take 1 additional tablet as needed for swelling. 10/05/22   [name], [name], MD  tretinoin (RETIN-A) 0.05 % cream Apply 1 application topically at bedtime. 01/17/21   [provider]  Past Surgical History Past Surgical History:  Procedure Laterality Date   ABDOMINAL SURGERY     pt states during miscarriage got her intestine   BRAIN SURGERY     EYE MUSCLE SURGERY Right 03/28/2014   PRESSURE SENSOR/CARDIOMEMS N/A 12/06/2021   Procedure: PRESSURE SENSOR/CARDIOMEMS;  Surgeon: Dolores Patty, MD;  Location: [facility];  Service: Cardiovascular;  Laterality: N/A;   RIGHT HEART CATH N/A 08/05/2021   Procedure: RIGHT HEART CATH;  Surgeon: Dolores Patty, MD;  Location: [facility];  Service: Cardiovascular;  Laterality: N/A;   RIGHT HEART CATH N/A 12/06/2021   Procedure: RIGHT HEART CATH;  Surgeon: Dolores Patty, MD;  Location: [facility];  Service: Cardiovascular;  Laterality: N/A;   RIGHT/LEFT HEART CATH AND CORONARY ANGIOGRAPHY N/A 12/03/2020   Procedure: RIGHT/LEFT HEART CATH AND CORONARY ANGIOGRAPHY;  Surgeon: Yates Decamp, MD;  Location: [facility];  Service: Cardiovascular;  Laterality: N/A;   VENTRICULOSTOMY  03/28/1997   Family History Family History  Problem Relation Age of Onset   Diabetes Mother    Hypertension Mother    Hyperlipidemia Mother    Thyroid disease Mother    Hypertension Father    Diabetes Father    Pancreatic cancer Paternal Aunt    Pancreatic cancer Paternal Uncle    Colon cancer Neg Hx    Esophageal cancer Neg Hx    Stomach cancer Neg Hx     Social History Social History   Tobacco Use   Smoking status: Never   Smokeless tobacco: Never  Vaping Use   Vaping status: Never Used  Substance Use Topics   Alcohol use: Never   Drug use:  Never   Allergies Icosapent ethyl (epa ethyl ester) (fish), Ketamine, [place], Morphine, Penicillins, Clarithromycin, Erythromycin, Fentanyl, Penicillin v, Shellfish allergy, and Prednisone  Review of Systems Review of Systems  All other systems reviewed and are negative.   Physical Exam Vital Signs  I have reviewed the triage vital signs BP (!) 180/105   Pulse (!) 104   Temp 98 F (36.7 C)   Resp 18   Ht 4\' 9"  (1.448 m)   Wt 65.3 kg   LMP 06/10/2023 (Exact Date)   SpO2 97%   BMI 31.16 kg/m  Physical Exam Vitals and nursing note reviewed.  Constitutional:      General: [name] not in acute distress.    Appearance: [name] well-developed.  HENT:     Head: Normocephalic and atraumatic.     Mouth/Throat:     Mouth: Mucous membranes are moist.  Eyes:     Pupils: Pupils are equal, round, and reactive to light.  Cardiovascular:     Rate and Rhythm: Normal rate and regular rhythm.     Heart sounds: No murmur heard. Pulmonary:     Effort: Pulmonary effort is normal. No respiratory distress.     Breath sounds: Normal breath sounds.  Abdominal:     General: Abdomen is flat.     Palpations: Abdomen is soft.     Tenderness: There is abdominal tenderness in the epigastric area.  Musculoskeletal:        General: No tenderness.     Right lower leg: No edema.     Left lower leg: No edema.  Skin:    General: Skin is warm and dry.  Neurological:     General: No focal deficit present.     Mental Status: [name] alert. Mental status is at baseline.  Psychiatric:        Mood and Affect: Mood normal.        Behavior: Behavior normal.     ED Results and Treatments Labs (all labs ordered are listed, but only abnormal results are displayed) Labs Reviewed  COMPREHENSIVE METABOLIC PANEL - Abnormal; Notable for the following components:      Result Value   CO2 21 (*)    Glucose, Bld 235 (*)    BUN 35 (*)    Creatinine, Ser 1.71 (*)    AST 129 (*)    ALT 114 (*)    GFR,  Estimated 40 (*)    All other components within normal limits  CBC WITH DIFFERENTIAL/PLATELET - Abnormal; Notable for the following components:   RBC 3.43 (*)    Hemoglobin 10.9 (*)    HCT 33.5 (*)    RDW 16.0 (*)    All other components within normal limits  CBG MONITORING, ED - Abnormal; Notable for the following components:   Glucose-Capillary 34 (*)    All other components within normal limits  CBG MONITORING, ED - Abnormal; Notable for the following components:   Glucose-Capillary 63 (*)    All other components within normal limits  CBG MONITORING, ED - Abnormal; Notable for the following components:   Glucose-Capillary 141 (*)    All other components within normal limits  CBG MONITORING, ED - Abnormal; Notable for the following components:   Glucose-Capillary 118 (*)    All other components within normal limits  LIPASE, BLOOD  TRIGLYCERIDES  CBG MONITORING, ED                                                                                                                          Radiology CT ABDOMEN PELVIS W CONTRAST Result Date: 06/21/2023 CLINICAL DATA:  Acute abdominal pain vomiting starting last night. EXAM: CT ABDOMEN AND PELVIS WITH CONTRAST TECHNIQUE: Multidetector CT imaging of the abdomen and pelvis was performed using the standard protocol following bolus administration of intravenous contrast. RADIATION DOSE REDUCTION: This exam was performed according to the departmental dose-optimization program which includes automated exposure control, adjustment of the mA and/or kV according to patient size and/or use of iterative reconstruction technique. CONTRAST:  75mL OMNIPAQUE IOHEXOL 300 MG/ML  SOLN COMPARISON:  05/08/2023 FINDINGS: Lower chest: Mild cardiomegaly. 4 mm right lower lobe nodule partially imaged on image 1 series 3, no change from examination from last month. Other 2-3 mm nodules are present in the lung bases as well. Hepatobiliary: Hepatomegaly and hepatic  steatosis. Gallbladder unremarkable. No biliary dilatation. No focal hepatic parenchymal lesion observed. Pancreas: No peripancreatic fluid or substantial change in the pancreatic contour. No definite imaging findings of pancreatitis. Spleen: The spleen measures 10.3 by 6.8 by 12.2 cm (volume = 450 cm^3), compatible with mild splenomegaly. No focal splenic lesion. Adrenals/Urinary Tract: Unremarkable Stomach/Bowel: Unremarkable Vascular/Lymphatic: Abnormally advanced for age atheromatous vascular calcification involving the lower abdominal aorta and common iliac arteries Reproductive: Unremarkable  Other: No supplemental non-categorized findings. Musculoskeletal: Unremarkable IMPRESSION: 1. No specific abnormality is identified to explain the patient's abdominal pain. 2. Hepatomegaly and hepatic steatosis. 3. Mild splenomegaly. 4. Abnormally advanced for age atheromatous vascular calcification involving the lower abdominal aorta and common iliac arteries. 5. Mild cardiomegaly. 6. Small basilar nodules up to 0.4 cm in diameter, not changed from 05/10/2019, considered benign. No further imaging workup of these lesions is indicated. 7. Abnormal advanced for age atherosclerosis involving the lower abdominal aorta and common iliac arteries pre Electronically Signed   By: Gaylyn Rong M.D.   On: 06/21/2023 19:39    Pertinent labs & imaging results that were available during my care of the patient were reviewed by me and considered in my medical decision making (see MDM for details).  Medications Ordered in ED Medications  sodium chloride 0.9 % bolus 1,000 mL (0 mLs Intravenous Stopped 06/21/23 1841)  ondansetron (ZOFRAN) injection 4 mg (4 mg Intravenous Given 06/21/23 1723)  HYDROmorphone (DILAUDID) injection 0.5 mg (0.5 mg Intravenous Given 06/21/23 1724)  HYDROmorphone (DILAUDID) injection 0.5 mg (0.5 mg Intravenous Given 06/21/23 1841)  iohexol (OMNIPAQUE) 300 MG/ML solution 100 mL (75 mLs Intravenous  Contrast Given 06/21/23 1815)  ondansetron (ZOFRAN) injection 4 mg (4 mg Intravenous Given 06/21/23 2024)  dextrose 50 % solution 12.5 g (12.5 g Intravenous Given 06/21/23 2059)  HYDROmorphone (DILAUDID) injection 0.5 mg (0.5 mg Intravenous Given 06/21/23 2119)  diphenhydrAMINE (BENADRYL) injection 25 mg (25 mg Intravenous Given 06/21/23 2227)                                                                                                                                     Procedures Procedures  (including critical care time)  Medical Decision Making / ED Course   MDM:  33 year old presenting to the emergency department abdominal pain.  Patient overall well-appearing, physical examination with mild epigastric tenderness.  Differential includes pancreatitis, chronic pancreatitis, gastroparesis, gastritis.  Differential also includes perforation, obstruction, volvulus.  Was here recently with similar symptoms, initially felt better but now worsening.  Will obtain CT scan to further evaluate.  Also check ECG but overall very low concern for ACS.  Clinical Course as of 06/21/23 2239  Wed Jun 21, 2023  2235 EKG is reassuring.  Labs with CKD appearing at baseline.  Lipase is reassuring.  Patient did have a brief drop in her blood glucose, down to 34.  She reported that she took her regular insulin dose but has not been able to eat due to nausea and vomiting.  The patient was intact.  She was able to tolerate p.o. with improvement, did require 1 dose of half amp of D50, subsequently has been stable.  Nausea also improved with second dose of Zofran.  [name] able to tolerate p.o. and feels much better than when she arrived.  CT scan does not show any signs of acute intra-abdominal process such as  recurrent pancreatitis, shows incidental advanced atherosclerotic disease which patient is aware of due to her genetic lipoprotein deficiency.  Given improvement in symptoms, will discharge to home.  Send  triglycerides but with reassuring lipase and negative CT scan overall very low concern for acute pancreatitis and triglyceride level would result tonight as it would have to go to the main hospital lab.  Advise close follow-up with primary care physician. Will discharge patient to home. All questions answered. Patient comfortable with plan of discharge. Return precautions discussed with patient and specified on the after visit summary.  [WS]    Clinical Course User Index [WS] Lonell Grandchild, MD     Additional history obtained: -External records from outside source obtained and reviewed including: Chart review including previous notes, labs, imaging, consultation notes including prior notes    Lab Tests: -I ordered, reviewed, and interpreted labs.   The pertinent results include:   Labs Reviewed  COMPREHENSIVE METABOLIC PANEL - Abnormal; Notable for the following components:      Result Value   CO2 21 (*)    Glucose, Bld 235 (*)    BUN 35 (*)    Creatinine, Ser 1.71 (*)    AST 129 (*)    ALT 114 (*)    GFR, Estimated 40 (*)    All other components within normal limits  CBC WITH DIFFERENTIAL/PLATELET - Abnormal; Notable for the following components:   RBC 3.43 (*)    Hemoglobin 10.9 (*)    HCT 33.5 (*)    RDW 16.0 (*)    All other components within normal limits  CBG MONITORING, ED - Abnormal; Notable for the following components:   Glucose-Capillary 34 (*)    All other components within normal limits  CBG MONITORING, ED - Abnormal; Notable for the following components:   Glucose-Capillary 63 (*)    All other components within normal limits  CBG MONITORING, ED - Abnormal; Notable for the following components:   Glucose-Capillary 141 (*)    All other components within normal limits  CBG MONITORING, ED - Abnormal; Notable for the following components:   Glucose-Capillary 118 (*)    All other components within normal limits  LIPASE, BLOOD  TRIGLYCERIDES  CBG MONITORING,  ED    Notable for CKD , mild anemia, normal lipase   EKG   EKG Interpretation Date/Time:  Wednesday June 21 2023 18:40:50 EDT Ventricular Rate:  98 PR Interval:  130 QRS Duration:  92 QT Interval:  381 QTC Calculation: 487 R Axis:   88  Text Interpretation: Sinus rhythm Borderline prolonged QT interval No significant change since last tracing Reconfirmed by Alvino Blood ([id number]) on 06/21/2023 7:03:53 PM         Imaging Studies ordered: I ordered imaging studies including CT abdomen On my interpretation imaging demonstrates no acute process I independently visualized and interpreted imaging. I agree with the radiologist interpretation   Medicines ordered and prescription drug management: Meds ordered this encounter  Medications   sodium chloride 0.9 % bolus 1,000 mL   ondansetron (ZOFRAN) injection 4 mg   HYDROmorphone (DILAUDID) injection 0.5 mg   HYDROmorphone (DILAUDID) injection 0.5 mg   iohexol (OMNIPAQUE) 300 MG/ML solution 100 mL   ondansetron (ZOFRAN) injection 4 mg   dextrose 50 % solution 12.5 g   HYDROmorphone (DILAUDID) injection 0.5 mg   diphenhydrAMINE (BENADRYL) injection 25 mg   promethazine (PHENERGAN) 25 MG tablet    Sig: Take 1 tablet (25 mg total) by mouth every 6 (six)  hours as needed for nausea or vomiting.    Dispense:  20 tablet    Refill:  0    -I have reviewed the patients home medicines and have made adjustments as needed   Social Determinants of Health:  Diagnosis or treatment significantly limited by social determinants of health: obesity   Reevaluation: After the interventions noted above, I reevaluated the patient and found that their symptoms have resolved  Co morbidities that complicate the patient evaluation  Past Medical History:  Diagnosis Date   Afib (HCC) 05/12/2021   Brain tumor (HCC) 03/29/1995   astrocytoma   CHF (congestive heart failure) (HCC)    Cholesterosis    CKD (chronic kidney disease) stage 4, GFR  15-29 ml/min (HCC) 05/13/2021   DM (diabetes mellitus) (HCC) 10/10/2018   Fatty liver    HTN (hypertension) 10/10/2018   Hypothyroidism 10/10/2018   Lipoprotein deficiency    Lung disease    longevity long term   Pancreatitis    Polycystic ovary syndrome       Dispostion: Disposition decision including need for hospitalization was considered, and patient discharged from emergency department.    Final Clinical Impression(s) / ED Diagnoses Final diagnoses:  Epigastric pain     This chart was dictated using voice recognition software.  Despite best efforts to proofread,  errors can occur which can change the documentation meaning.    Lonell Grandchild, MD 06/21/23 2239

## 2023-06-21 NOTE — ED Notes (Signed)
 FSBS 63 MD made aware orders given

## 2023-06-21 NOTE — Discharge Instructions (Addendum)
 We evaluated you for your abdominal pain.  Your testing was reassuring, including your pancreas enzyme testing and your CT scan.  You did have a small drop in your blood sugar, most likely due to not eating.  This improved with treatment in the emergency department.  Please keep a close eye on your blood sugar at home.  Please follow-up very closely with your primary care doctor.  We have prescribed you an alternative antinausea medicine which you can take at home if your Compazine does not seem to be working.  Please do not combine this with Compazine.  Please follow-up closely with your primary doctor.  If you have any recurrent symptoms such as persistent or uncontrolled nausea vomiting, severe abdominal pain, lightheadedness or dizziness, difficulty breathing, or any other new symptoms, please return to the emergency department for reassessment.

## 2023-06-21 NOTE — ED Triage Notes (Signed)
 In for eval of umbilical abd pain with vomiting started last pm. Was seen on 3/23 but did not have vomiting at this time. Last BM today, small but feels like she is constipated.

## 2023-06-21 NOTE — ED Notes (Signed)
 FSBS REGISTERED 34, MD MADE AWARE, PO food/juice given.

## 2023-06-21 NOTE — ED Notes (Signed)
Nausea improving.

## 2023-06-22 ENCOUNTER — Emergency Department (HOSPITAL_BASED_OUTPATIENT_CLINIC_OR_DEPARTMENT_OTHER)
Admission: EM | Admit: 2023-06-22 | Discharge: 2023-06-22 | Disposition: A | Discharge status: Home / Self Care | Attending: Emergency Medicine | Admitting: Emergency Medicine

## 2023-06-22 ENCOUNTER — Encounter (HOSPITAL_BASED_OUTPATIENT_CLINIC_OR_DEPARTMENT_OTHER): Payer: Self-pay | Admitting: Emergency Medicine

## 2023-06-22 DIAGNOSIS — R109 Unspecified abdominal pain: Secondary | ICD-10-CM | POA: Insufficient documentation

## 2023-06-22 DIAGNOSIS — G8929 Other chronic pain: Secondary | ICD-10-CM | POA: Insufficient documentation

## 2023-06-22 DIAGNOSIS — Z79899 Other long term (current) drug therapy: Secondary | ICD-10-CM | POA: Insufficient documentation

## 2023-06-22 DIAGNOSIS — R112 Nausea with vomiting, unspecified: Secondary | ICD-10-CM | POA: Diagnosis not present

## 2023-06-22 DIAGNOSIS — Z794 Long term (current) use of insulin: Secondary | ICD-10-CM | POA: Insufficient documentation

## 2023-06-22 DIAGNOSIS — N184 Chronic kidney disease, stage 4 (severe): Secondary | ICD-10-CM | POA: Insufficient documentation

## 2023-06-22 DIAGNOSIS — R1013 Epigastric pain: Secondary | ICD-10-CM | POA: Diagnosis not present

## 2023-06-22 DIAGNOSIS — Z7982 Long term (current) use of aspirin: Secondary | ICD-10-CM | POA: Insufficient documentation

## 2023-06-22 DIAGNOSIS — I509 Heart failure, unspecified: Secondary | ICD-10-CM | POA: Insufficient documentation

## 2023-06-22 DIAGNOSIS — I130 Hypertensive heart and chronic kidney disease with heart failure and stage 1 through stage 4 chronic kidney disease, or unspecified chronic kidney disease: Secondary | ICD-10-CM | POA: Insufficient documentation

## 2023-06-22 DIAGNOSIS — E1122 Type 2 diabetes mellitus with diabetic chronic kidney disease: Secondary | ICD-10-CM | POA: Insufficient documentation

## 2023-06-22 DIAGNOSIS — E039 Hypothyroidism, unspecified: Secondary | ICD-10-CM | POA: Insufficient documentation

## 2023-06-22 LAB — CBC
HCT: 35.3 % — ABNORMAL LOW (ref 36.0–46.0)
Hemoglobin: 11.2 g/dL — ABNORMAL LOW (ref 12.0–15.0)
MCH: 30.8 pg (ref 26.0–34.0)
MCHC: 31.7 g/dL (ref 30.0–36.0)
MCV: 97 fL (ref 80.0–100.0)
Platelets: 162 10*3/uL (ref 150–400)
RBC: 3.64 MIL/uL — ABNORMAL LOW (ref 3.87–5.11)
RDW: 15.9 % — ABNORMAL HIGH (ref 11.5–15.5)
WBC: 6.1 10*3/uL (ref 4.0–10.5)
nRBC: 0 % (ref 0.0–0.2)

## 2023-06-22 LAB — COMPREHENSIVE METABOLIC PANEL WITH GFR
ALT: 123 U/L — ABNORMAL HIGH (ref 0–44)
AST: 116 U/L — ABNORMAL HIGH (ref 15–41)
Albumin: 4.9 g/dL (ref 3.5–5.0)
Alkaline Phosphatase: 100 U/L (ref 38–126)
Anion gap: 14 (ref 5–15)
BUN: 32 mg/dL — ABNORMAL HIGH (ref 6–20)
CO2: 19 mmol/L — ABNORMAL LOW (ref 22–32)
Calcium: 9.5 mg/dL (ref 8.9–10.3)
Chloride: 100 mmol/L (ref 98–111)
Creatinine, Ser: 1.63 mg/dL — ABNORMAL HIGH (ref 0.44–1.00)
GFR, Estimated: 43 mL/min — ABNORMAL LOW (ref >60)
Glucose, Bld: 223 mg/dL — ABNORMAL HIGH (ref 70–99)
Potassium: 4.2 mmol/L (ref 3.5–5.1)
Sodium: 133 mmol/L — ABNORMAL LOW (ref 135–145)
Total Bilirubin: 0.9 mg/dL (ref 0.0–1.2)
Total Protein: 8.3 g/dL — ABNORMAL HIGH (ref 6.5–8.1)

## 2023-06-22 LAB — CBG MONITORING, ED
Glucose-Capillary: 135 mg/dL — ABNORMAL HIGH (ref 70–99)
Glucose-Capillary: 45 mg/dL — ABNORMAL LOW (ref 70–99)
Glucose-Capillary: 95 mg/dL (ref 70–99)
Glucose-Capillary: 75 mg/dL (ref 70–99)

## 2023-06-22 LAB — URINALYSIS, ROUTINE W REFLEX MICROSCOPIC
Bacteria, UA: NONE SEEN
Bilirubin Urine: NEGATIVE
Glucose, UA: >1000 mg/dL — AB
Ketones, ur: NEGATIVE mg/dL
Leukocytes,Ua: NEGATIVE
Nitrite: NEGATIVE
Protein, ur: 30 mg/dL — AB
Specific Gravity, Urine: 1.008 (ref 1.005–1.030)
pH: 6 (ref 5.0–8.0)

## 2023-06-22 LAB — LIPASE, BLOOD: Lipase: 25 U/L (ref 11–51)

## 2023-06-22 LAB — PREGNANCY, URINE: Preg Test, Ur: NEGATIVE

## 2023-06-22 MED ORDER — ONDANSETRON HCL 4 MG/2ML IJ SOLN
4.0000 mg | Freq: Once | INTRAMUSCULAR | Status: AC
  Administered 2023-06-22: 4 mg via INTRAVENOUS
  Filled 2023-06-22: qty 2

## 2023-06-22 MED ORDER — LACTATED RINGERS IV BOLUS
1000.0000 mL | Freq: Once | INTRAVENOUS | Status: AC
  Administered 2023-06-22: 1000 mL via INTRAVENOUS

## 2023-06-22 MED ORDER — PROMETHAZINE HCL 25 MG RE SUPP: 25.0000 mg | Freq: Four times a day (QID) | RECTAL | 0 refills | Status: DC | PRN

## 2023-06-22 MED ORDER — HYDROMORPHONE HCL 1 MG/ML IJ SOLN
1.0000 mg | Freq: Once | INTRAMUSCULAR | Status: AC
  Administered 2023-06-22: 1 mg via INTRAVENOUS
  Filled 2023-06-22: qty 1

## 2023-06-22 MED ORDER — DEXTROSE 50 % IV SOLN
1.0000 | Freq: Once | INTRAVENOUS | Status: AC
  Administered 2023-06-22: 50 mL via INTRAVENOUS
  Filled 2023-06-22: qty 50

## 2023-06-22 MED ORDER — HYDROMORPHONE HCL 1 MG/ML IJ SOLN
0.5000 mg | Freq: Once | INTRAMUSCULAR | Status: AC
  Administered 2023-06-22: 0.5 mg via INTRAVENOUS
  Filled 2023-06-22: qty 1

## 2023-06-22 NOTE — ED Notes (Signed)
 Pt able to keep snacks down

## 2023-06-22 NOTE — ED Triage Notes (Signed)
 Abdo pain, n/v/ unable to hold down anything Started yesterday getting worse  Epigastric to umbilicus dull aching Distended abdo

## 2023-06-22 NOTE — ED Provider Notes (Cosign Needed Addendum)
 [facility] AT [facility] Provider Note   CSN: [id number] Arrival date & time: 06/22/23  1315     History  Chief Complaint  Patient presents with   Abdominal Pain    Jennifer [name] is a 33 y.o. female.  33 year old female presents today for concern of ongoing abdominal pain with associated nausea and vomiting.  Patient was seen yesterday as well as 4 days ago for similar symptoms.  She states while she here she has some symptomatic improvement however since going home last night she has had additional 10 episodes of emesis.  She describes emesis or mostly bilious.  Denies hematemesis.  Denies any changes to her stool habits.  Endorses epigastric abdominal pain.  States she has history of pancreatitis from high triglyceride levels.  Denies alcohol abuse.  She states at this point she is worried about her symptom management and she would like these managed better.  States outside of the p.o. intake challenge she did in the emergency department last night the last time she ate or drink anything and was able to keep it down was 2 days ago.  The history is provided by the patient. No language interpreter was used.       Home Medications Prior to Admission medications   Medication Sig Start Date End Date Taking? Authorizing Provider  amitriptyline (ELAVIL) 10 MG tablet Take 10 mg by mouth at bedtime.    [provider]  aspirin EC 81 MG EC tablet Take 1 tablet (81 mg total) by mouth daily. Swallow whole. 12/07/20   Pennie Banter, DO  azelastine (ASTELIN) 0.1 % nasal spray Place 2 sprays into both nostrils daily at 6 (six) AM.    [provider]  BLISOVI FE 1.5/30 1.5-30 MG-MCG tablet Take 1 tablet by mouth daily.    [provider]  butalbital-acetaminophen-caffeine (FIORICET) 50-325-40 MG tablet Take 1 tablet by mouth daily as needed for headache. 09/26/22   [provider]  carvedilol (COREG) 3.125 MG tablet Take 1  tablet (3.125 mg total) by mouth 2 (two) times daily. 10/04/22   [name], [name], MD  CRANBERRY CONCENTRATE PO Take 2 tablets by mouth 2 (two) times daily.    [provider]  CREON 36000-114000 units CPEP capsule Take 1 capsule (36,000 Units total) by mouth 3 (three) times daily. 05/10/23   Rai, Delene Ruffini, MD  diclofenac (VOLTAREN) 75 MG EC tablet Take 1 tablet (75 mg total) by mouth 2 (two) times daily. 06/09/23   Candelaria Stagers, DPM  doxycycline (VIBRA-TABS) 100 MG tablet Take 1 tablet (100 mg total) by mouth daily. Take with food. 06/06/23 09/04/23  Terri Piedra, DO  DULoxetine (CYMBALTA) 60 MG capsule Take 60 mg by mouth at bedtime. 09/26/22   [provider]  ergocalciferol (VITAMIN D2) 1.25 MG (50000 UT) capsule Take 50,000 Units by mouth every 7 (seven) days. Every Sunday    [provider]  Evolocumab (REPATHA) 140 MG/ML SOSY Inject 140 mg into the skin every 14 (fourteen) days. Saturdays    [provider]  fenofibrate 54 MG tablet Take 1 tablet (54 mg total) by mouth daily. Patient taking differently: Take 54 mg by mouth at bedtime. 12/01/22   [name], [name], MD  hydrALAZINE (APRESOLINE) 50 MG tablet Take 1 tablet (50 mg total) by mouth 3 (three) times daily. 05/10/23   Rai, Ripudeep K, MD  insulin lispro (HUMALOG) 100 UNIT/ML injection Inject 30-40 Units into the skin See admin instructions.  Inject subcutaneously 40 units with breakfast, 30 units with lunch and 30 units with dinner    [provider]  Insulin Pen Needle 32G X 4 MM MISC 1 Device by Does not apply route QID. For use with insulin pens 02/13/22   [name], MD  insulin regular human CONCENTRATED (HUMULIN R U-500 KWIKPEN) 500 UNIT/ML KwikPen Inject 185 Units into the skin 3 (three) times daily with meals. Patient taking differently: Inject 195 Units into the skin 2 (two) times daily with a meal. Inject subcutaneously 195 units with breakfast and lunch 09/20/22 01/09/24  [name], MD  ivabradine (CORLANOR) 7.5 MG TABS tablet Take 1 tablet (7.5 mg total) by mouth 2 (two) times daily with a meal. Patient taking differently: Take 7.5 mg by mouth at bedtime. 01/31/23   Milford, Anderson Malta, FNP  levocetirizine (XYZAL) 5 MG tablet Take 5 mg by mouth daily. 03/05/22   [provider]  levothyroxine (SYNTHROID) 137 MCG tablet Take 137 mcg by mouth daily. 04/12/23   [provider]  metoCLOPramide (REGLAN) 5 MG tablet Take 1 tablet (5 mg total) by mouth every 8 (eight) hours as needed for nausea or vomiting. 05/10/23 05/09/24  Rai, Delene Ruffini, MD  oxyCODONE (ROXICODONE) 5 MG immediate release tablet Take 1 tablet (5 mg total) by mouth every 6 (six) hours as needed for severe pain (pain score 7-10). 06/18/23   Tanda Rockers A, DO  pantoprazole (PROTONIX) 40 MG tablet Take 40 mg by mouth daily. 12/24/19   [provider]  polyethylene glycol (MIRALAX / GLYCOLAX) 17 g packet Take 17 g by mouth daily as needed for mild constipation. Also available OTC 05/10/23   Rai, Ripudeep K, MD  pregabalin (LYRICA) 300 MG capsule Take 300 mg by mouth 2 (two) times daily.    [provider]  prochlorperazine (COMPAZINE) 10 MG tablet Take 10 mg by mouth at bedtime.    [provider]  promethazine (PHENERGAN) 25 MG tablet Take 1 tablet (25 mg total) by mouth every 6 (six) hours as needed for nausea or vomiting. 06/21/23   Lonell Grandchild, MD  rifaximin (XIFAXAN) 550 MG TABS tablet Take 1 tablet (550 mg total) by mouth 3 (three) times daily. 05/10/23   Rai, Delene Ruffini, MD  rosuvastatin (CRESTOR) 40 MG tablet Take 40 mg by mouth daily. 08/09/21   [provider]  Safety Seal Miscellaneous MISC Hormonic Hair Solution with minoxidil USP 7% and finasteride USP 0.05% - apply to affected areas daily every morning. 06/06/23   Terri Piedra, DO  sodium bicarbonate 650 MG tablet Take 2 tablets (1,300 mg total) by mouth 2 (two) times daily. 05/10/23   Rai,  Delene Ruffini, MD  sucralfate (CARAFATE) 1 g tablet Take 1 tablet (1 g total) by mouth with breakfast, with lunch, and with evening meal for 7 days. 06/18/23 06/25/23  Sloan Leiter, DO  SUMAtriptan (IMITREX) 25 MG tablet Take 25 mg by mouth every 2 (two) hours as needed. 01/30/23   [provider]  tamsulosin (FLOMAX) 0.4 MG CAPS capsule Take 1 capsule (0.4 mg total) by mouth daily. 05/10/23   Rai, Delene Ruffini, MD  topiramate (TOPAMAX) 50 MG tablet Take 50 mg by mouth 2 (two) times daily.    [provider]  torsemide (DEMADEX) 20 MG tablet Take 2 tablets (40 mg total) by mouth daily. May take 1 additional tablet as needed Patient taking differently: Take 20-40 mg by mouth See admin instructions. Take  40mg  daily. May take 1 additional tablet as needed for swelling. 10/05/22   [name], [name], MD  tretinoin (RETIN-A) 0.05 % cream Apply 1 application topically at bedtime. 01/17/21   [provider]      Allergies    Icosapent ethyl (epa ethyl ester) (fish), Ketamine, [place], Morphine, Penicillins, Clarithromycin, Erythromycin, Fentanyl, Penicillin v, Shellfish allergy, and Prednisone    Review of Systems   Review of Systems  Constitutional:  Negative for chills and fever.  Respiratory:  Negative for shortness of breath.   Gastrointestinal:  Positive for abdominal pain, nausea and vomiting.  Genitourinary:  Negative for dysuria.  Neurological:  Negative for light-headedness.  All other systems reviewed and are negative.   Physical Exam Updated Vital Signs BP (!) 191/107 (BP Location: Right Arm)   Pulse (!) 117   Temp 98 F (36.7 C)   Resp 20   LMP 06/10/2023 (Exact Date)   SpO2 98%  Physical Exam Vitals and nursing note reviewed.  Constitutional:      General: [name] not in acute distress.    Appearance: Normal appearance. [name] not ill-appearing.  HENT:     Head: Normocephalic and atraumatic.     Nose: Nose normal.  Eyes:     General: No scleral  icterus.    Extraocular Movements: Extraocular movements intact.     Conjunctiva/sclera: Conjunctivae normal.  Cardiovascular:     Rate and Rhythm: Normal rate and regular rhythm.     Pulses: Normal pulses.     Heart sounds: Normal heart sounds.  Pulmonary:     Effort: Pulmonary effort is normal. No respiratory distress.     Breath sounds: Normal breath sounds. No wheezing or rales.  Abdominal:     General: There is no distension.     Tenderness: There is no abdominal tenderness.  Musculoskeletal:        General: Normal range of motion.     Cervical back: Normal range of motion.  Skin:    General: Skin is warm and dry.  Neurological:     General: No focal deficit present.     Mental Status: [name] alert. Mental status is at baseline.     ED Results / Procedures / Treatments   Labs (all labs ordered are listed, but only abnormal results are displayed) Labs Reviewed  COMPREHENSIVE METABOLIC PANEL WITH GFR - Abnormal; Notable for the following components:      Result Value   Sodium 133 (*)    CO2 19 (*)    Glucose, Bld 223 (*)    BUN 32 (*)    Creatinine, Ser 1.63 (*)    Total Protein 8.3 (*)    AST 116 (*)    ALT 123 (*)    GFR, Estimated 43 (*)    All other components within normal limits  CBC - Abnormal; Notable for the following components:   RBC 3.64 (*)    Hemoglobin 11.2 (*)    HCT 35.3 (*)    RDW 15.9 (*)    All other components within normal limits  URINALYSIS, ROUTINE W REFLEX MICROSCOPIC - Abnormal; Notable for the following components:   Glucose, UA >1,000 (*)    Hgb urine dipstick LARGE (*)    Protein, ur 30 (*)    All other components within normal limits  LIPASE, BLOOD  PREGNANCY, URINE    EKG None  Radiology CT ABDOMEN PELVIS W CONTRAST Result Date: 06/21/2023 CLINICAL DATA:  Acute abdominal pain vomiting starting  last night. EXAM: CT ABDOMEN AND PELVIS WITH CONTRAST TECHNIQUE: Multidetector CT imaging of the abdomen and pelvis was performed  using the standard protocol following bolus administration of intravenous contrast. RADIATION DOSE REDUCTION: This exam was performed according to the departmental dose-optimization program which includes automated exposure control, adjustment of the mA and/or kV according to patient size and/or use of iterative reconstruction technique. CONTRAST:  75mL OMNIPAQUE IOHEXOL 300 MG/ML  SOLN COMPARISON:  05/08/2023 FINDINGS: Lower chest: Mild cardiomegaly. 4 mm right lower lobe nodule partially imaged on image 1 series 3, no change from examination from last month. Other 2-3 mm nodules are present in the lung bases as well. Hepatobiliary: Hepatomegaly and hepatic steatosis. Gallbladder unremarkable. No biliary dilatation. No focal hepatic parenchymal lesion observed. Pancreas: No peripancreatic fluid or substantial change in the pancreatic contour. No definite imaging findings of pancreatitis. Spleen: The spleen measures 10.3 by 6.8 by 12.2 cm (volume = 450 cm^3), compatible with mild splenomegaly. No focal splenic lesion. Adrenals/Urinary Tract: Unremarkable Stomach/Bowel: Unremarkable Vascular/Lymphatic: Abnormally advanced for age atheromatous vascular calcification involving the lower abdominal aorta and common iliac arteries Reproductive: Unremarkable Other: No supplemental non-categorized findings. Musculoskeletal: Unremarkable IMPRESSION: 1. No specific abnormality is identified to explain the patient's abdominal pain. 2. Hepatomegaly and hepatic steatosis. 3. Mild splenomegaly. 4. Abnormally advanced for age atheromatous vascular calcification involving the lower abdominal aorta and common iliac arteries. 5. Mild cardiomegaly. 6. Small basilar nodules up to 0.4 cm in diameter, not changed from 05/10/2019, considered benign. No further imaging workup of these lesions is indicated. 7. Abnormal advanced for age atherosclerosis involving the lower abdominal aorta and common iliac arteries pre Electronically Signed    By: Gaylyn Rong M.D.   On: 06/21/2023 19:39    Procedures Procedures    Medications Ordered in ED Medications  ondansetron (ZOFRAN) injection 4 mg (has no administration in time range)  HYDROmorphone (DILAUDID) injection 1 mg (has no administration in time range)  lactated ringers bolus 1,000 mL (has no administration in time range)    ED Course/ Medical Decision Making/ A&P                                 Medical Decision Making Amount and/or Complexity of Data Reviewed Labs: ordered.  Risk Prescription drug management.   Medical Decision Making / ED Course   This patient presents to the ED for concern of abdominal pain, this involves an extensive number of treatment options, and is a complaint that carries with it a high risk of complications and morbidity.  The differential diagnosis includes gastroparesis, constipation, acute on chronic abdominal pain, pancreatitis colitis, diverticulitis  MDM: 33 year old female presents today for concern of persistent abdominal pain with associated nausea and vomiting.  This is her third visit in the past week.  Will attempt symptom management.  Will obtain labs.  Admission considered but will obtain labs and reevaluate.  CBC without leukocytosis.  Hemoglobin of 11.2 which is around her baseline.  CMP of glucose of 223, creatinine 1.63.  This is around her baseline.  Otherwise no acute concern outside of mildly elevated liver function test.  Patient later had hypoglycemia episode.  She admitted to using 20 units of insulin this morning despite knowing that she was not to be able to eat or drink.  This was a fast acting insulin.  She was given some D50 and juice p.o.  She was able to tolerate p.o.  Hypoglycemia resolved.  Her symptoms resolved.  She has been able to tolerate p.o. intake on multiple occasions.  Patient is agreeable for discharge.  Discharged in stable condition.  Initially admission was considered given this was her  third visit in 1 week for similar symptoms however her symptoms resolved and she is tolerating p.o. intake.  Will send in Phenergan suppository for her to keep on hand.  She states she has Compazine at home.  Imaging considered however she just had a CT scan done yesterday and exam is overall benign.  Prior to discharge she had drop in her blood sugar.  Will hold off on discharge and continue with p.o. trial and ensure that her blood sugar stays stable.  Sinus oncoming provider.  Advised patient that she will not get any additional pain medicine.   Additional history obtained: -Additional history obtained from previous ED visits -External records from outside source obtained and reviewed including: Chart review including previous notes, labs, imaging, consultation notes   Lab Tests: -I ordered, reviewed, and interpreted labs.   The pertinent results include:   Labs Reviewed  COMPREHENSIVE METABOLIC PANEL WITH GFR - Abnormal; Notable for the following components:      Result Value   Sodium 133 (*)    CO2 19 (*)    Glucose, Bld 223 (*)    BUN 32 (*)    Creatinine, Ser 1.63 (*)    Total Protein 8.3 (*)    AST 116 (*)    ALT 123 (*)    GFR, Estimated 43 (*)    All other components within normal limits  CBC - Abnormal; Notable for the following components:   RBC 3.64 (*)    Hemoglobin 11.2 (*)    HCT 35.3 (*)    RDW 15.9 (*)    All other components within normal limits  URINALYSIS, ROUTINE W REFLEX MICROSCOPIC - Abnormal; Notable for the following components:   Glucose, UA >1,000 (*)    Hgb urine dipstick LARGE (*)    Protein, ur 30 (*)    All other components within normal limits  CBG MONITORING, ED - Abnormal; Notable for the following components:   Glucose-Capillary 45 (*)    All other components within normal limits  CBG MONITORING, ED - Abnormal; Notable for the following components:   Glucose-Capillary 135 (*)    All other components within normal limits  LIPASE, BLOOD   PREGNANCY, URINE      EKG  EKG Interpretation Date/Time:    Ventricular Rate:    PR Interval:    QRS Duration:    QT Interval:    QTC Calculation:   R Axis:      Text Interpretation:          Medicines ordered and prescription drug management: Meds ordered this encounter  Medications   ondansetron (ZOFRAN) injection 4 mg   HYDROmorphone (DILAUDID) injection 1 mg   lactated ringers bolus 1,000 mL   dextrose 50 % solution 50 mL   HYDROmorphone (DILAUDID) injection 0.5 mg    -I have reviewed the patients home medicines and have made adjustments as needed  Reevaluation: After the interventions noted above, I reevaluated the patient and found that they have :resolved  Co morbidities that complicate the patient evaluation  Past Medical History:  Diagnosis Date   Afib (HCC) 05/12/2021   Brain tumor (HCC) 03/29/1995   astrocytoma   CHF (congestive heart failure) (HCC)    Cholesterosis    CKD (chronic kidney disease)  stage 4, GFR 15-29 ml/min (HCC) 05/13/2021   DM (diabetes mellitus) (HCC) 10/10/2018   Fatty liver    HTN (hypertension) 10/10/2018   Hypothyroidism 10/10/2018   Lipoprotein deficiency    Lung disease    longevity long term   Pancreatitis    Polycystic ovary syndrome       Dispostion: Signed out to oncoming provider.    Final Clinical Impression(s) / ED Diagnoses Final diagnoses:  Chronic abdominal pain    Rx / DC Orders ED Discharge Orders          Ordered    promethazine (PHENERGAN) 25 MG suppository  Every 6 hours PRN        06/22/23 1809                 Marita Kansas, PA-C 06/22/23 1842    Glyn Ade, MD 06/23/23 1701

## 2023-06-22 NOTE — ED Provider Notes (Signed)
 Patient's care assumed at 7 PM.  Patient is pending recheck of her glucose.  Patient is eating and drinking now.  Patient has had nausea and vomiting previously but she is not currently experiencing either.  Patient reevaluated CBG is 75.  Patient reports that she is feeling better and wants to go home.  I advised patient to hold her evening insulin.  [name] encouraged to continue to eat and drink return to the emergency department if any problems.   Elson Areas, Cordelia Poche 06/22/23 2046    Glyn Ade, MD 06/23/23 [phone]

## 2023-06-22 NOTE — ED Notes (Signed)
 Pt called out requesting juice because the app on her phone was alerting her to her sugar dropping. Found pt diaphoretic and lethargic stating she does not feel well. ED POC CBG obtained and read 45. Pt was able to protect her airway so she was given apple juice followed by D50 IV

## 2023-06-22 NOTE — Discharge Instructions (Signed)
 Your work today was reassuring.  You are able to tolerate food and drink in the emergency department.  Please do not take insulin again unless you have tolerated something to eat or drink before hand.  Reduce your dose until you are eating an adequate amount.  Follow-up with your gastroenterologist and your primary care doctor.  Return for any emergent symptoms.

## 2023-06-22 NOTE — ED Notes (Signed)
 Pt provided additional snacks and juice

## 2023-06-22 NOTE — ED Notes (Signed)
 Pt given peanut butter crackers and apple juice for PO challenge

## 2023-06-22 NOTE — ED Notes (Signed)
 Pt given orange juice.

## 2023-06-24 ENCOUNTER — Emergency Department (HOSPITAL_BASED_OUTPATIENT_CLINIC_OR_DEPARTMENT_OTHER)
Admission: EM | Admit: 2023-06-24 | Discharge: 2023-06-25 | Disposition: A | Discharge status: Home / Self Care | Attending: Emergency Medicine | Admitting: Emergency Medicine

## 2023-06-24 ENCOUNTER — Other Ambulatory Visit: Payer: Self-pay

## 2023-06-24 DIAGNOSIS — I5023 Acute on chronic systolic (congestive) heart failure: Secondary | ICD-10-CM | POA: Diagnosis not present

## 2023-06-24 DIAGNOSIS — Z794 Long term (current) use of insulin: Secondary | ICD-10-CM | POA: Insufficient documentation

## 2023-06-24 DIAGNOSIS — N184 Chronic kidney disease, stage 4 (severe): Secondary | ICD-10-CM | POA: Insufficient documentation

## 2023-06-24 DIAGNOSIS — I130 Hypertensive heart and chronic kidney disease with heart failure and stage 1 through stage 4 chronic kidney disease, or unspecified chronic kidney disease: Secondary | ICD-10-CM | POA: Diagnosis not present

## 2023-06-24 DIAGNOSIS — E11649 Type 2 diabetes mellitus with hypoglycemia without coma: Secondary | ICD-10-CM | POA: Insufficient documentation

## 2023-06-24 DIAGNOSIS — E039 Hypothyroidism, unspecified: Secondary | ICD-10-CM | POA: Diagnosis not present

## 2023-06-24 DIAGNOSIS — Z8616 Personal history of COVID-19: Secondary | ICD-10-CM | POA: Insufficient documentation

## 2023-06-24 DIAGNOSIS — Z79899 Other long term (current) drug therapy: Secondary | ICD-10-CM | POA: Diagnosis not present

## 2023-06-24 DIAGNOSIS — R509 Fever, unspecified: Secondary | ICD-10-CM | POA: Diagnosis present

## 2023-06-24 DIAGNOSIS — Z7982 Long term (current) use of aspirin: Secondary | ICD-10-CM | POA: Insufficient documentation

## 2023-06-24 DIAGNOSIS — E162 Hypoglycemia, unspecified: Secondary | ICD-10-CM

## 2023-06-24 DIAGNOSIS — E1122 Type 2 diabetes mellitus with diabetic chronic kidney disease: Secondary | ICD-10-CM | POA: Insufficient documentation

## 2023-06-24 LAB — CBG MONITORING, ED
Glucose-Capillary: 130 mg/dL — ABNORMAL HIGH (ref 70–99)
Glucose-Capillary: 61 mg/dL — ABNORMAL LOW (ref 70–99)
Glucose-Capillary: 74 mg/dL (ref 70–99)
Glucose-Capillary: 78 mg/dL (ref 70–99)

## 2023-06-24 LAB — CBC WITH DIFFERENTIAL/PLATELET
Abs Immature Granulocytes: 0.08 10*3/uL — ABNORMAL HIGH (ref 0.00–0.07)
Basophils Absolute: 0.1 10*3/uL (ref 0.0–0.1)
Basophils Relative: 1 %
Eosinophils Absolute: 0.1 10*3/uL (ref 0.0–0.5)
Eosinophils Relative: 1 %
HCT: 33.3 % — ABNORMAL LOW (ref 36.0–46.0)
Hemoglobin: 11.4 g/dL — ABNORMAL LOW (ref 12.0–15.0)
Immature Granulocytes: 1 %
Lymphocytes Relative: 36 %
Lymphs Abs: 2.9 10*3/uL (ref 0.7–4.0)
MCH: 32.4 pg (ref 26.0–34.0)
MCHC: 34.2 g/dL (ref 30.0–36.0)
MCV: 94.6 fL (ref 80.0–100.0)
Monocytes Absolute: 0.8 10*3/uL (ref 0.1–1.0)
Monocytes Relative: 9 %
Neutro Abs: 4.2 10*3/uL (ref 1.7–7.7)
Neutrophils Relative %: 52 %
Platelets: 190 10*3/uL (ref 150–400)
RBC: 3.52 MIL/uL — ABNORMAL LOW (ref 3.87–5.11)
RDW: 15.5 % (ref 11.5–15.5)
WBC: 8.2 10*3/uL (ref 4.0–10.5)
nRBC: 0 % (ref 0.0–0.2)

## 2023-06-24 LAB — COMPREHENSIVE METABOLIC PANEL WITH GFR
ALT: 86 U/L — ABNORMAL HIGH (ref 0–44)
AST: 68 U/L — ABNORMAL HIGH (ref 15–41)
Albumin: 4.4 g/dL (ref 3.5–5.0)
Alkaline Phosphatase: 107 U/L (ref 38–126)
Anion gap: 11 (ref 5–15)
BUN: 32 mg/dL — ABNORMAL HIGH (ref 6–20)
CO2: 24 mmol/L (ref 22–32)
Calcium: 10.1 mg/dL (ref 8.9–10.3)
Chloride: 98 mmol/L (ref 98–111)
Creatinine, Ser: 1.65 mg/dL — ABNORMAL HIGH (ref 0.44–1.00)
GFR, Estimated: 42 mL/min — ABNORMAL LOW (ref >60)
Glucose, Bld: 59 mg/dL — ABNORMAL LOW (ref 70–99)
Potassium: 4 mmol/L (ref 3.5–5.1)
Sodium: 133 mmol/L — ABNORMAL LOW (ref 135–145)
Total Bilirubin: 0.7 mg/dL (ref 0.0–1.2)
Total Protein: 7.4 g/dL (ref 6.5–8.1)

## 2023-06-24 MED ORDER — ACETAMINOPHEN 500 MG PO TABS
1000.0000 mg | ORAL_TABLET | Freq: Once | ORAL | Status: AC
  Administered 2023-06-24: 1000 mg via ORAL
  Filled 2023-06-24: qty 2

## 2023-06-24 MED ORDER — ONDANSETRON HCL 4 MG PO TABS
4.0000 mg | ORAL_TABLET | Freq: Once | ORAL | Status: AC
  Administered 2023-06-24: 4 mg via ORAL
  Filled 2023-06-24: qty 1

## 2023-06-24 MED ORDER — HYDRALAZINE HCL 25 MG PO TABS
50.0000 mg | ORAL_TABLET | Freq: Once | ORAL | Status: DC
Start: 2023-06-24 — End: 2023-06-24

## 2023-06-24 MED ORDER — CARVEDILOL 6.25 MG PO TABS: 3.1250 mg | ORAL_TABLET | Freq: Once | ORAL | Status: DC

## 2023-06-24 MED ORDER — DEXTROSE 50 % IV SOLN
25.0000 mL | Freq: Once | INTRAVENOUS | Status: AC
  Administered 2023-06-24: 25 mL via INTRAVENOUS
  Filled 2023-06-24: qty 50

## 2023-06-24 NOTE — ED Triage Notes (Addendum)
 Abd pain. Emesis-4x today. Fever-102.3.   30 units insulin 1830 ~400 CBG at home 1830 CBG in triage 78

## 2023-06-24 NOTE — ED Provider Notes (Signed)
 [place] EMERGENCY DEPARTMENT AT [facility] Provider Note  CSN: [id number] Arrival date & time: 06/24/23 2035  Chief Complaint(s) No chief complaint on file.  HPI Yanin Muhlestein is a 33 y.o. female this is a 33 year old female well-known to our emergency room who is here today because she had a fever at home.  She then noticed that her sugar was high so she took 30 units of her fast acting insulin.  Denies any dysuria or increased urinary frequency.  Patient with history of chronic abdominal pain.   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   CHF (congestive heart failure) (HCC)    Cholesterosis    CKD (chronic kidney disease) stage 4, GFR 15-29 ml/min (HCC) 05/13/2021   DM (diabetes mellitus) (HCC) 10/10/2018   Fatty liver    HTN (hypertension) 10/10/2018   Hypothyroidism 10/10/2018   Lipoprotein deficiency    Lung disease    longevity long term   Pancreatitis    Polycystic ovary syndrome    Patient Active Problem List   Diagnosis Date Noted   Abdominal pain 05/05/2023   Pseudohyponatremia 04/10/2023   Metabolic acidosis 04/10/2023   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 coli UTI (urinary tract infection) 06/01/2022   DKA (diabetic ketoacidosis) (HCC) 06/01/2022   ASCUS of cervix with negative high risk HPV 05/31/2022   Pancreatitis 05/29/2022   Hypokalemia 03/23/2022    COVID 03/15/2022   COVID-19 virus infection 03/14/2022   Hyponatremia 03/14/2022   Positive D dimer 03/14/2022   Stage 3b chronic kidney disease (CKD) (HCC) 03/14/2022   Pulmonary hypertension (HCC) 02/06/2022   NASH (nonalcoholic steatohepatitis) 02/06/2022   Obesity (BMI 30-39.9) 02/06/2022   Heart failure (HCC) 12/03/2021   Acute on chronic systolic CHF (congestive heart failure) (HCC) 12/02/2021   Elevated troponin 12/02/2021   Pancytopenia (HCC) 12/02/2021   Amenorrhea 12/02/2021   Hepatic steatosis 09/27/2021   Hyperosmolar hyperglycemic state (HHS) (HCC) 08/26/2021   Small intestinal bacterial overgrowth (SIBO) 08/26/2021   Lower extremity edema 08/26/2021   Hyperkalemia 08/14/2021   Hx of insulin dependent diabetes mellitus 08/14/2021   CKD (chronic kidney disease) stage 4, GFR 15-29 ml/min (HCC) 05/13/2021   Afib (HCC) 05/12/2021   Gastroparesis due to DM (HCC) 05/12/2021   Abdominal distension 05/12/2021   Chest pain 03/10/2021   Acute on chronic combined systolic and diastolic CHF (congestive heart failure) (HCC) 02/09/2021   History of astrocytoma of brain 02/09/2021   Type 2 diabetes mellitus with hyperglycemia, with long-term current use of insulin (HCC) 01/25/2021   Diarrhea    Elevated transaminase level    Orthostatic hypotension 11/28/2020   Acute combined systolic and diastolic heart failure (HCC)    Nonischemic cardiomyopathy (HCC)    Acute decompensated heart failure (HCC)    Elevated liver enzymes    Acute kidney injury superimposed on stage 3b chronic kidney disease (HCC) 11/26/2020   Intractable abdominal pain 11/23/2020   Chronic systolic CHF (congestive  heart failure) (HCC) 11/23/2020   HTN (hypertension) 11/23/2020   Familial hypertriglyceridemia 11/23/2020   Prolonged QT interval 11/23/2020   Finger mass, left 08/12/2020   Nausea and vomiting 07/23/2020   Polycystic ovary syndrome 01/30/2020   Moderate aortic insufficiency 08/16/2019   Moderate  mitral regurgitation 08/16/2019   Lipoprotein lipase deficiency, familial 06/19/2019   Microalbuminuria due to type 1 diabetes mellitus (HCC) 06/19/2019   Type 2 diabetes mellitus with hyperlipidemia (HCC) 10/10/2018   Hypothyroidism 10/10/2018   Home Medication(s) Prior to Admission medications   Medication Sig Start Date End Date Taking? Authorizing Provider  amitriptyline (ELAVIL) 10 MG tablet Take 10 mg by mouth at bedtime.    [provider]  aspirin EC 81 MG EC tablet Take 1 tablet (81 mg total) by mouth daily. Swallow whole. 12/07/20   Pennie Banter, DO  azelastine (ASTELIN) 0.1 % nasal spray Place 2 sprays into both nostrils daily at 6 (six) AM.    [provider]  BLISOVI FE 1.5/30 1.5-30 MG-MCG tablet Take 1 tablet by mouth daily.    [provider]  butalbital-acetaminophen-caffeine (FIORICET) 50-325-40 MG tablet Take 1 tablet by mouth daily as needed for headache. 09/26/22   [provider]  carvedilol (COREG) 3.125 MG tablet Take 1 tablet (3.125 mg total) by mouth 2 (two) times daily. 10/04/22   [name], [name], MD  CRANBERRY CONCENTRATE PO Take 2 tablets by mouth 2 (two) times daily.    [provider]  CREON 36000-114000 units CPEP capsule Take 1 capsule (36,000 Units total) by mouth 3 (three) times daily. 05/10/23   Rai, Delene Ruffini, MD  diclofenac (VOLTAREN) 75 MG EC tablet Take 1 tablet (75 mg total) by mouth 2 (two) times daily. 06/09/23   Candelaria Stagers, DPM  doxycycline (VIBRA-TABS) 100 MG tablet Take 1 tablet (100 mg total) by mouth daily. Take with food. 06/06/23 09/04/23  Terri Piedra, DO  DULoxetine (CYMBALTA) 60 MG capsule Take 60 mg by mouth at bedtime. 09/26/22   [provider]  ergocalciferol (VITAMIN D2) 1.25 MG (50000 UT) capsule Take 50,000 Units by mouth every 7 (seven) days. Every Sunday    [provider]  Evolocumab (REPATHA) 140 MG/ML SOSY Inject 140 mg into the skin every 14 (fourteen) days.  Saturdays    [provider]  fenofibrate 54 MG tablet Take 1 tablet (54 mg total) by mouth daily. Patient taking differently: Take 54 mg by mouth at bedtime. 12/01/22   [name], [name], MD  hydrALAZINE (APRESOLINE) 50 MG tablet Take 1 tablet (50 mg total) by mouth 3 (three) times daily. 05/10/23   Rai, Ripudeep K, MD  insulin lispro (HUMALOG) 100 UNIT/ML injection Inject 30-40 Units into the skin See admin instructions. Inject subcutaneously 40 units with breakfast, 30 units with lunch and 30 units with dinner    [provider]  Insulin Pen Needle 32G X 4 MM MISC 1 Device by Does not apply route QID. For use with insulin pens 02/13/22   [name], MD  insulin regular human CONCENTRATED (HUMULIN R U-500 KWIKPEN) 500 UNIT/ML KwikPen Inject 185 Units into the skin 3 (three) times daily with meals. Patient taking differently: Inject 195 Units into the skin 2 (two) times daily with a meal. Inject subcutaneously 195 units with breakfast and lunch 09/20/22 01/09/24  [name], MD  ivabradine (CORLANOR) 7.5 MG TABS tablet Take 1 tablet (7.5 mg total) by mouth 2 (two) times daily with a meal. Patient  taking differently: Take 7.5 mg by mouth at bedtime. 01/31/23   Milford, Anderson Malta, FNP  levocetirizine (XYZAL) 5 MG tablet Take 5 mg by mouth daily. 03/05/22   [provider]  levothyroxine (SYNTHROID) 137 MCG tablet Take 137 mcg by mouth daily. 04/12/23   [provider]  metoCLOPramide (REGLAN) 5 MG tablet Take 1 tablet (5 mg total) by mouth every 8 (eight) hours as needed for nausea or vomiting. 05/10/23 05/09/24  Rai, Delene Ruffini, MD  oxyCODONE (ROXICODONE) 5 MG immediate release tablet Take 1 tablet (5 mg total) by mouth every 6 (six) hours as needed for severe pain (pain score 7-10). 06/18/23   Tanda Rockers A, DO  pantoprazole (PROTONIX) 40 MG tablet Take 40 mg by mouth daily. 12/24/19   [provider]  polyethylene glycol (MIRALAX / GLYCOLAX) 17 g  packet Take 17 g by mouth daily as needed for mild constipation. Also available OTC 05/10/23   Rai, Ripudeep K, MD  pregabalin (LYRICA) 300 MG capsule Take 300 mg by mouth 2 (two) times daily.    [provider]  prochlorperazine (COMPAZINE) 10 MG tablet Take 10 mg by mouth at bedtime.    [provider]  promethazine (PHENERGAN) 25 MG suppository Place 1 suppository (25 mg total) rectally every 6 (six) hours as needed for nausea or vomiting. 06/22/23   Marita Kansas, PA-C  promethazine (PHENERGAN) 25 MG tablet Take 1 tablet (25 mg total) by mouth every 6 (six) hours as needed for nausea or vomiting. 06/21/23   Lonell Grandchild, MD  rifaximin (XIFAXAN) 550 MG TABS tablet Take 1 tablet (550 mg total) by mouth 3 (three) times daily. 05/10/23   Rai, Delene Ruffini, MD  rosuvastatin (CRESTOR) 40 MG tablet Take 40 mg by mouth daily. 08/09/21   [provider]  Safety Seal Miscellaneous MISC Hormonic Hair Solution with minoxidil USP 7% and finasteride USP 0.05% - apply to affected areas daily every morning. 06/06/23   Terri Piedra, DO  sodium bicarbonate 650 MG tablet Take 2 tablets (1,300 mg total) by mouth 2 (two) times daily. 05/10/23   Rai, Delene Ruffini, MD  sucralfate (CARAFATE) 1 g tablet Take 1 tablet (1 g total) by mouth with breakfast, with lunch, and with evening meal for 7 days. 06/18/23 06/25/23  Sloan Leiter, DO  SUMAtriptan (IMITREX) 25 MG tablet Take 25 mg by mouth every 2 (two) hours as needed. 01/30/23   [provider]  tamsulosin (FLOMAX) 0.4 MG CAPS capsule Take 1 capsule (0.4 mg total) by mouth daily. 05/10/23   Rai, Delene Ruffini, MD  topiramate (TOPAMAX) 50 MG tablet Take 50 mg by mouth 2 (two) times daily.    [provider]  torsemide (DEMADEX) 20 MG tablet Take 2 tablets (40 mg total) by mouth daily. May take 1 additional tablet as needed Patient taking differently: Take 20-40 mg by mouth See admin instructions. Take 40mg  daily. May take 1 additional  tablet as needed for swelling. 10/05/22   [name], [name], MD  tretinoin (RETIN-A) 0.05 % cream Apply 1 application topically at bedtime. 01/17/21   [provider]  Past Surgical History Past Surgical History:  Procedure Laterality Date   ABDOMINAL SURGERY     pt states during miscarriage got her intestine   BRAIN SURGERY     EYE MUSCLE SURGERY Right 03/28/2014   PRESSURE SENSOR/CARDIOMEMS N/A 12/06/2021   Procedure: PRESSURE SENSOR/CARDIOMEMS;  Surgeon: Dolores Patty, MD;  Location: [facility];  Service: Cardiovascular;  Laterality: N/A;   RIGHT HEART CATH N/A 08/05/2021   Procedure: RIGHT HEART CATH;  Surgeon: Dolores Patty, MD;  Location: [facility];  Service: Cardiovascular;  Laterality: N/A;   RIGHT HEART CATH N/A 12/06/2021   Procedure: RIGHT HEART CATH;  Surgeon: Dolores Patty, MD;  Location: [facility];  Service: Cardiovascular;  Laterality: N/A;   RIGHT/LEFT HEART CATH AND CORONARY ANGIOGRAPHY N/A 12/03/2020   Procedure: RIGHT/LEFT HEART CATH AND CORONARY ANGIOGRAPHY;  Surgeon: Yates Decamp, MD;  Location: [facility];  Service: Cardiovascular;  Laterality: N/A;   VENTRICULOSTOMY  03/28/1997   Family History Family History  Problem Relation Age of Onset   Diabetes Mother    Hypertension Mother    Hyperlipidemia Mother    Thyroid disease Mother    Hypertension Father    Diabetes Father    Pancreatic cancer Paternal Aunt    Pancreatic cancer Paternal Uncle    Colon cancer Neg Hx    Esophageal cancer Neg Hx    Stomach cancer Neg Hx     Social History Social History   Tobacco Use   Smoking status: Never   Smokeless tobacco: Never  Vaping Use   Vaping status: Never Used  Substance Use Topics   Alcohol use: Never   Drug use: Never   Allergies Icosapent ethyl (epa ethyl ester) (fish),  Ketamine, [place], Morphine, Penicillins, Clarithromycin, Erythromycin, Fentanyl, Penicillin v, Shellfish allergy, and Prednisone  Review of Systems Review of Systems  Physical Exam Vital Signs  I have reviewed the triage vital signs BP (!) 175/90   Pulse 95   Temp 98.7 F (37.1 C) (Oral)   Resp 16   LMP 06/10/2023 (Exact Date)   SpO2 97%   Physical Exam Vitals reviewed.  HENT:     Head: Normocephalic.  Cardiovascular:     Rate and Rhythm: Normal rate.  Pulmonary:     Effort: Pulmonary effort is normal.  Abdominal:     General: Abdomen is flat. There is no distension.     Palpations: Abdomen is soft.     Tenderness: There is no abdominal tenderness.  Skin:    General: Skin is warm and dry.  Neurological:     Mental Status: [name] alert. Mental status is at baseline.     ED Results and Treatments Labs (all labs ordered are listed, but only abnormal results are displayed) Labs Reviewed  COMPREHENSIVE METABOLIC PANEL WITH GFR - Abnormal; Notable for the following components:      Result Value   Sodium 133 (*)    Glucose, Bld 59 (*)    BUN 32 (*)    Creatinine, Ser 1.65 (*)    AST 68 (*)    ALT 86 (*)    GFR, Estimated 42 (*)    All other components within normal limits  CBC WITH DIFFERENTIAL/PLATELET - Abnormal; Notable for the following components:   RBC 3.52 (*)    Hemoglobin 11.4 (*)    HCT 33.3 (*)    Abs Immature Granulocytes 0.08 (*)    All other components within normal limits  CBG MONITORING, ED - Abnormal; Notable for the following components:   Glucose-Capillary 61 (*)    All other components within normal limits  CBG MONITORING, ED - Abnormal; Notable for the following components:   Glucose-Capillary 130 (*)    All other components within normal limits  CBG MONITORING, ED  CBG MONITORING, ED                                                                                                                          Radiology No results  found.  Pertinent labs & imaging results that were available during my care of the patient were reviewed by me and considered in my medical decision making (see MDM for details).  Medications Ordered in ED Medications  acetaminophen (TYLENOL) tablet 1,000 mg (1,000 mg Oral Given 06/24/23 2322)  ondansetron (ZOFRAN) tablet 4 mg (4 mg Oral Given 06/24/23 2322)  dextrose 50 % solution 25 mL (25 mLs Intravenous Given 06/24/23 2327)                                                                                                                                     Procedures Procedures  (including critical care time)  Medical Decision Making / ED Course   This patient presents to the ED for concern of fever, low blood sugar, abdominal pain., this involves an extensive number of treatment options, and is a complaint that carries with it a high risk of complications and morbidity.   MDM: Patient CT imaging for the same symptoms 3 days ago, negative.  Abdomen overall soft.  Labs reviewed, no leukocytosis, hemoglobin at baseline.  Renal function at baseline.  Patient has not been febrile here in the emergency room.  Will provide her with some Tylenol, Zofran.  Her repeat blood sugar on her BMP was 59.  Have given her some juice, will recheck.  Reassessment 11:30 PM-patient's sugar was 62.  Gave her half of amp of D50.  Recheck was 130.  Patient tolerating p.o.  Overall looks well.  Labs reviewed.  Patient chronically ill, is at her baseline.  No indication for additional imaging or testing.  Will discharge home.  Additional history obtained: -Additional history obtained from  -External records from outside source obtained and reviewed including: Chart review including previous notes, labs, imaging, consultation notes   Lab Tests: -I ordered, reviewed, and interpreted labs.   The  pertinent results include:   Labs Reviewed  COMPREHENSIVE METABOLIC PANEL WITH GFR - Abnormal; Notable for the  following components:      Result Value   Sodium 133 (*)    Glucose, Bld 59 (*)    BUN 32 (*)    Creatinine, Ser 1.65 (*)    AST 68 (*)    ALT 86 (*)    GFR, Estimated 42 (*)    All other components within normal limits  CBC WITH DIFFERENTIAL/PLATELET - Abnormal; Notable for the following components:   RBC 3.52 (*)    Hemoglobin 11.4 (*)    HCT 33.3 (*)    Abs Immature Granulocytes 0.08 (*)    All other components within normal limits  CBG MONITORING, ED - Abnormal; Notable for the following components:   Glucose-Capillary 61 (*)    All other components within normal limits  CBG MONITORING, ED - Abnormal; Notable for the following components:   Glucose-Capillary 130 (*)    All other components within normal limits  CBG MONITORING, ED  CBG MONITORING, ED      Medicines ordered and prescription drug management: Meds ordered this encounter  Medications   DISCONTD: carvedilol (COREG) tablet 3.125 mg   DISCONTD: hydrALAZINE (APRESOLINE) tablet 50 mg   acetaminophen (TYLENOL) tablet 1,000 mg   ondansetron (ZOFRAN) tablet 4 mg   dextrose 50 % solution 25 mL    -I have reviewed the patients home medicines and have made adjustments as needed  Cardiac Monitoring: The patient was maintained on a cardiac monitor.  I personally viewed and interpreted the cardiac monitored which showed an underlying rhythm of: Normal sinus rhythm  Social Determinants of Health:  Factors impacting patients care include: Multiple medical comorbidities diabetes, chronic kidney disease, fatty liver disease   Reevaluation: After the interventions noted above, I reevaluated the patient and found that they have :improved  Co morbidities that complicate the patient evaluation  Past Medical History:  Diagnosis Date   Afib (HCC) 05/12/2021   Brain tumor (HCC) 03/29/1995   astrocytoma   CHF (congestive heart failure) (HCC)    Cholesterosis    CKD (chronic kidney disease) stage 4, GFR 15-29 ml/min  (HCC) 05/13/2021   DM (diabetes mellitus) (HCC) 10/10/2018   Fatty liver    HTN (hypertension) 10/10/2018   Hypothyroidism 10/10/2018   Lipoprotein deficiency    Lung disease    longevity long term   Pancreatitis    Polycystic ovary syndrome       Dispostion: I considered admission for this patient, however patient improved and is appropriate for discharge.     Final Clinical Impression(s) / ED Diagnoses Final diagnoses:  Hypoglycemia     @PCDICTATION @    Anders Simmonds T, DO 06/24/23 2344

## 2023-06-24 NOTE — Discharge Instructions (Addendum)
 Your labs that were done today were at your baseline.  Continue taking all medications as prescribed.  Please follow-up with your primary care doctor within 1 week.

## 2023-06-27 ENCOUNTER — Ambulatory Visit (HOSPITAL_BASED_OUTPATIENT_CLINIC_OR_DEPARTMENT_OTHER): Payer: Medicaid Other | Attending: Primary Care | Admitting: Internal Medicine

## 2023-06-27 VITALS — Ht <= 58 in | Wt 144.0 lb

## 2023-06-27 DIAGNOSIS — R0681 Apnea, not elsewhere classified: Secondary | ICD-10-CM | POA: Insufficient documentation

## 2023-06-27 DIAGNOSIS — G4733 Obstructive sleep apnea (adult) (pediatric): Secondary | ICD-10-CM | POA: Diagnosis present

## 2023-06-27 DIAGNOSIS — G4734 Idiopathic sleep related nonobstructive alveolar hypoventilation: Secondary | ICD-10-CM | POA: Insufficient documentation

## 2023-06-28 ENCOUNTER — Encounter (HOSPITAL_COMMUNITY): Payer: Self-pay | Admitting: Emergency Medicine

## 2023-06-28 ENCOUNTER — Emergency Department (HOSPITAL_COMMUNITY)
Admission: EM | Admit: 2023-06-28 | Discharge: 2023-06-28 | Disposition: A | Discharge status: Home / Self Care | Attending: Emergency Medicine | Admitting: Emergency Medicine

## 2023-06-28 ENCOUNTER — Other Ambulatory Visit: Payer: Self-pay

## 2023-06-28 ENCOUNTER — Emergency Department (HOSPITAL_COMMUNITY)

## 2023-06-28 DIAGNOSIS — Z794 Long term (current) use of insulin: Secondary | ICD-10-CM | POA: Insufficient documentation

## 2023-06-28 DIAGNOSIS — R251 Tremor, unspecified: Secondary | ICD-10-CM | POA: Diagnosis not present

## 2023-06-28 DIAGNOSIS — E1165 Type 2 diabetes mellitus with hyperglycemia: Secondary | ICD-10-CM | POA: Insufficient documentation

## 2023-06-28 DIAGNOSIS — Z79899 Other long term (current) drug therapy: Secondary | ICD-10-CM | POA: Insufficient documentation

## 2023-06-28 DIAGNOSIS — Z7982 Long term (current) use of aspirin: Secondary | ICD-10-CM | POA: Insufficient documentation

## 2023-06-28 DIAGNOSIS — M791 Myalgia, unspecified site: Secondary | ICD-10-CM | POA: Insufficient documentation

## 2023-06-28 DIAGNOSIS — I10 Essential (primary) hypertension: Secondary | ICD-10-CM | POA: Diagnosis not present

## 2023-06-28 DIAGNOSIS — R109 Unspecified abdominal pain: Secondary | ICD-10-CM | POA: Diagnosis present

## 2023-06-28 DIAGNOSIS — R739 Hyperglycemia, unspecified: Secondary | ICD-10-CM

## 2023-06-28 LAB — CBC WITH DIFFERENTIAL/PLATELET
Abs Immature Granulocytes: 0.03 10*3/uL (ref 0.00–0.07)
Basophils Absolute: 0.1 10*3/uL (ref 0.0–0.1)
Basophils Relative: 1 %
Eosinophils Absolute: 0.1 10*3/uL (ref 0.0–0.5)
Eosinophils Relative: 1 %
HCT: 38.6 % (ref 36.0–46.0)
Hemoglobin: 12.4 g/dL (ref 12.0–15.0)
Immature Granulocytes: 1 %
Lymphocytes Relative: 35 %
Lymphs Abs: 2.3 10*3/uL (ref 0.7–4.0)
MCH: 30.5 pg (ref 26.0–34.0)
MCHC: 32.1 g/dL (ref 30.0–36.0)
MCV: 95.1 fL (ref 80.0–100.0)
Monocytes Absolute: 0.5 10*3/uL (ref 0.1–1.0)
Monocytes Relative: 8 %
Neutro Abs: 3.7 10*3/uL (ref 1.7–7.7)
Neutrophils Relative %: 54 %
Platelets: 187 10*3/uL (ref 150–400)
RBC: 4.06 MIL/uL (ref 3.87–5.11)
RDW: 14.7 % (ref 11.5–15.5)
WBC: 6.6 10*3/uL (ref 4.0–10.5)
nRBC: 0 % (ref 0.0–0.2)

## 2023-06-28 LAB — COMPREHENSIVE METABOLIC PANEL WITH GFR
ALT: 114 U/L — ABNORMAL HIGH (ref 0–44)
AST: 92 U/L — ABNORMAL HIGH (ref 15–41)
Albumin: 4.1 g/dL (ref 3.5–5.0)
Alkaline Phosphatase: 120 U/L (ref 38–126)
Anion gap: 14 (ref 5–15)
BUN: 57 mg/dL — ABNORMAL HIGH (ref 6–20)
CO2: 20 mmol/L — ABNORMAL LOW (ref 22–32)
Calcium: 9.6 mg/dL (ref 8.9–10.3)
Chloride: 93 mmol/L — ABNORMAL LOW (ref 98–111)
Creatinine, Ser: 2.03 mg/dL — ABNORMAL HIGH (ref 0.44–1.00)
GFR, Estimated: 33 mL/min — ABNORMAL LOW (ref >60)
Glucose, Bld: 479 mg/dL — ABNORMAL HIGH (ref 70–99)
Potassium: 5.3 mmol/L — ABNORMAL HIGH (ref 3.5–5.1)
Sodium: 127 mmol/L — ABNORMAL LOW (ref 135–145)
Total Bilirubin: 1 mg/dL (ref 0.0–1.2)
Total Protein: 8.1 g/dL (ref 6.5–8.1)

## 2023-06-28 LAB — CBG MONITORING, ED
Glucose-Capillary: 491 mg/dL — ABNORMAL HIGH (ref 70–99)
Glucose-Capillary: 218 mg/dL — ABNORMAL HIGH (ref 70–99)

## 2023-06-28 LAB — URINALYSIS, W/ REFLEX TO CULTURE (INFECTION SUSPECTED)
Bacteria, UA: NONE SEEN
Bilirubin Urine: NEGATIVE
Glucose, UA: >=500 mg/dL — AB
Ketones, ur: NEGATIVE mg/dL
Leukocytes,Ua: NEGATIVE
Nitrite: NEGATIVE
Protein, ur: 100 mg/dL — AB
Specific Gravity, Urine: 1.014 (ref 1.005–1.030)
pH: 6 (ref 5.0–8.0)

## 2023-06-28 LAB — I-STAT CG4 LACTIC ACID, ED
Lactic Acid, Venous: 2.8 mmol/L (ref 0.5–1.9)
Lactic Acid, Venous: 2.1 mmol/L (ref 0.5–1.9)

## 2023-06-28 LAB — HCG, SERUM, QUALITATIVE: Preg, Serum: NEGATIVE

## 2023-06-28 MED ORDER — LABETALOL HCL 5 MG/ML IV SOLN
5.0000 mg | Freq: Once | INTRAVENOUS | Status: AC
  Administered 2023-06-28: 5 mg via INTRAVENOUS
  Filled 2023-06-28: qty 4

## 2023-06-28 MED ORDER — ALUM & MAG HYDROXIDE-SIMETH 200-200-20 MG/5ML PO SUSP
15.0000 mL | Freq: Once | ORAL | Status: AC
  Administered 2023-06-28: 15 mL via ORAL
  Filled 2023-06-28: qty 30

## 2023-06-28 MED ORDER — FAMOTIDINE IN NACL 20-0.9 MG/50ML-% IV SOLN
20.0000 mg | Freq: Once | INTRAVENOUS | Status: AC
  Administered 2023-06-28: 20 mg via INTRAVENOUS
  Filled 2023-06-28: qty 50

## 2023-06-28 MED ORDER — SODIUM CHLORIDE 0.9 % IV BOLUS
1000.0000 mL | Freq: Once | INTRAVENOUS | Status: AC
  Administered 2023-06-28: 1000 mL via INTRAVENOUS

## 2023-06-28 MED ORDER — SODIUM CHLORIDE 0.9 % IV SOLN: Freq: Once | INTRAVENOUS | Status: AC

## 2023-06-28 MED ORDER — INSULIN ASPART 100 UNIT/ML IJ SOLN
10.0000 [IU] | Freq: Once | INTRAMUSCULAR | Status: AC
Start: 2023-06-28 — End: 2023-06-28
  Administered 2023-06-28: 10 [IU] via SUBCUTANEOUS
  Filled 2023-06-28: qty 0.1

## 2023-06-28 MED ORDER — DIPHENHYDRAMINE HCL 50 MG/ML IJ SOLN
50.0000 mg | Freq: Once | INTRAMUSCULAR | Status: AC
  Administered 2023-06-28: 50 mg via INTRAVENOUS
  Filled 2023-06-28: qty 1

## 2023-06-28 NOTE — Discharge Instructions (Addendum)
 Return for any problem.   Your blood sugars frequently.  It is important to maintain close monitoring so that you can have better control of your glucose.

## 2023-06-28 NOTE — ED Provider Triage Note (Signed)
 Emergency Medicine Provider Triage Evaluation Note  Jennifer [name] , a 33 y.o. female  was evaluated in triage.  Pt complains of n/v, body tremors, generalized body aches. Hx of DM on insulin.   Endorses shortness of breath, bilateral ankle swelling today.  Denies fevers, chest pain, abdominal pain, diarrhea  Review of Systems  Positive: N/a Negative: N/a  Physical Exam  BP (!) 206/116   Pulse (!) 117   Temp 98.2 F (36.8 C) (Oral)   Resp (!) 22   LMP 06/10/2023 (Exact Date)   SpO2 97%  Gen:   Awake, no distress   Resp:  Normal effort  MSK:   Moves extremities without difficulty  Other:    Medical Decision Making  Medically screening exam initiated at 6:33 PM.  Appropriate orders placed.  Jennifer [name] was informed that the remainder of the evaluation will be completed by another provider, this initial triage assessment does not replace that evaluation, and the importance of remaining in the ED until their evaluation is complete.     Jennifer [name], [place] 06/28/23 [phone]

## 2023-06-28 NOTE — ED Triage Notes (Signed)
 Patient presents due to nausea, tremors and generalized pain (especially in her fingers and feet). Symptoms started a few weeks ago and have been getting worse.

## 2023-06-28 NOTE — ED Provider Notes (Signed)
  EMERGENCY DEPARTMENT AT [facility] Provider Note   CSN: [id number] Arrival date & time: 06/28/23  1807     History {Add pertinent medical, surgical, social history, OB history to HPI:1} Chief Complaint  Patient presents with   Emesis   Generalized Body Aches    Jennifer [name] is a 33 y.o. female.  33 year old female with prior medical history as detailed below presents for evaluation.  Patient with multiple chronic complaints.  She reports intermittent chronic nausea and vomiting, body tremors, generalized body aches.  She complains of chronic abdominal pain.  [name] on insulin for diabetes.  The history is provided by the patient.       Home Medications Prior to Admission medications   Medication Sig Start Date End Date Taking? Authorizing Provider  amitriptyline (ELAVIL) 10 MG tablet Take 10 mg by mouth at bedtime.    [provider]  aspirin EC 81 MG EC tablet Take 1 tablet (81 mg total) by mouth daily. Swallow whole. 12/07/20   Pennie Banter, DO  azelastine (ASTELIN) 0.1 % nasal spray Place 2 sprays into both nostrils daily at 6 (six) AM.    [provider]  BLISOVI FE 1.5/30 1.5-30 MG-MCG tablet Take 1 tablet by mouth daily.    [provider]  butalbital-acetaminophen-caffeine (FIORICET) 50-325-40 MG tablet Take 1 tablet by mouth daily as needed for headache. 09/26/22   [provider]  carvedilol (COREG) 3.125 MG tablet Take 1 tablet (3.125 mg total) by mouth 2 (two) times daily. 10/04/22   [name], [name], MD  CRANBERRY CONCENTRATE PO Take 2 tablets by mouth 2 (two) times daily.    [provider]  CREON 36000-114000 units CPEP capsule Take 1 capsule (36,000 Units total) by mouth 3 (three) times daily. 05/10/23   Rai, Delene Ruffini, MD  diclofenac (VOLTAREN) 75 MG EC tablet Take 1 tablet (75 mg total) by mouth 2 (two) times daily. 06/09/23   Candelaria Stagers, DPM  doxycycline (VIBRA-TABS) 100 MG tablet Take  1 tablet (100 mg total) by mouth daily. Take with food. 06/06/23 09/04/23  Terri Piedra, DO  DULoxetine (CYMBALTA) 60 MG capsule Take 60 mg by mouth at bedtime. 09/26/22   [provider]  ergocalciferol (VITAMIN D2) 1.25 MG (50000 UT) capsule Take 50,000 Units by mouth every 7 (seven) days. Every Sunday    [provider]  Evolocumab (REPATHA) 140 MG/ML SOSY Inject 140 mg into the skin every 14 (fourteen) days. Saturdays    [provider]  fenofibrate 54 MG tablet Take 1 tablet (54 mg total) by mouth daily. Patient taking differently: Take 54 mg by mouth at bedtime. 12/01/22   [name], [name], MD  hydrALAZINE (APRESOLINE) 50 MG tablet Take 1 tablet (50 mg total) by mouth 3 (three) times daily. 05/10/23   Rai, Ripudeep K, MD  insulin lispro (HUMALOG) 100 UNIT/ML injection Inject 30-40 Units into the skin See admin instructions. Inject subcutaneously 40 units with breakfast, 30 units with lunch and 30 units with dinner    [provider]  Insulin Pen Needle 32G X 4 MM MISC 1 Device by Does not apply route QID. For use with insulin pens 02/13/22   [name], MD  insulin regular human CONCENTRATED (HUMULIN R U-500 KWIKPEN) 500 UNIT/ML KwikPen Inject 185 Units into the skin 3 (three) times daily with meals. Patient taking differently: Inject 195 Units into the skin 2 (two) times daily with a meal. Inject subcutaneously 195  units with breakfast and lunch 09/20/22 01/09/24  [name], MD  ivabradine (CORLANOR) 7.5 MG TABS tablet Take 1 tablet (7.5 mg total) by mouth 2 (two) times daily with a meal. Patient taking differently: Take 7.5 mg by mouth at bedtime. 01/31/23   Milford, Anderson Malta, FNP  levocetirizine (XYZAL) 5 MG tablet Take 5 mg by mouth daily. 03/05/22   [provider]  levothyroxine (SYNTHROID) 137 MCG tablet Take 137 mcg by mouth daily. 04/12/23   [provider]  metoCLOPramide (REGLAN) 5 MG tablet Take 1 tablet (5 mg total) by  mouth every 8 (eight) hours as needed for nausea or vomiting. 05/10/23 05/09/24  Rai, Delene Ruffini, MD  oxyCODONE (ROXICODONE) 5 MG immediate release tablet Take 1 tablet (5 mg total) by mouth every 6 (six) hours as needed for severe pain (pain score 7-10). 06/18/23   Tanda Rockers A, DO  pantoprazole (PROTONIX) 40 MG tablet Take 40 mg by mouth daily. 12/24/19   [provider]  polyethylene glycol (MIRALAX / GLYCOLAX) 17 g packet Take 17 g by mouth daily as needed for mild constipation. Also available OTC 05/10/23   Rai, Ripudeep K, MD  pregabalin (LYRICA) 300 MG capsule Take 300 mg by mouth 2 (two) times daily.    [provider]  prochlorperazine (COMPAZINE) 10 MG tablet Take 10 mg by mouth at bedtime.    [provider]  promethazine (PHENERGAN) 25 MG suppository Place 1 suppository (25 mg total) rectally every 6 (six) hours as needed for nausea or vomiting. 06/22/23   Marita Kansas, PA-C  promethazine (PHENERGAN) 25 MG tablet Take 1 tablet (25 mg total) by mouth every 6 (six) hours as needed for nausea or vomiting. 06/21/23   Lonell Grandchild, MD  rifaximin (XIFAXAN) 550 MG TABS tablet Take 1 tablet (550 mg total) by mouth 3 (three) times daily. 05/10/23   Rai, Delene Ruffini, MD  rosuvastatin (CRESTOR) 40 MG tablet Take 40 mg by mouth daily. 08/09/21   [provider]  Safety Seal Miscellaneous MISC Hormonic Hair Solution with minoxidil USP 7% and finasteride USP 0.05% - apply to affected areas daily every morning. 06/06/23   Terri Piedra, DO  sodium bicarbonate 650 MG tablet Take 2 tablets (1,300 mg total) by mouth 2 (two) times daily. 05/10/23   Rai, Delene Ruffini, MD  sucralfate (CARAFATE) 1 g tablet Take 1 tablet (1 g total) by mouth with breakfast, with lunch, and with evening meal for 7 days. 06/18/23 06/25/23  Sloan Leiter, DO  SUMAtriptan (IMITREX) 25 MG tablet Take 25 mg by mouth every 2 (two) hours as needed. 01/30/23   [provider]  tamsulosin (FLOMAX)  0.4 MG CAPS capsule Take 1 capsule (0.4 mg total) by mouth daily. 05/10/23   Rai, Delene Ruffini, MD  topiramate (TOPAMAX) 50 MG tablet Take 50 mg by mouth 2 (two) times daily.    [provider]  torsemide (DEMADEX) 20 MG tablet Take 2 tablets (40 mg total) by mouth daily. May take 1 additional tablet as needed Patient taking differently: Take 20-40 mg by mouth See admin instructions. Take 40mg  daily. May take 1 additional tablet as needed for swelling. 10/05/22   [name], [name], MD  tretinoin (RETIN-A) 0.05 % cream Apply 1 application topically at bedtime. 01/17/21   [provider]      Allergies    Icosapent ethyl (epa ethyl ester) (fish), Ketamine, [place], Morphine, Penicillins, Clarithromycin, Erythromycin, Fentanyl, Penicillin v, Shellfish allergy, and Prednisone  Review of Systems   Review of Systems  All other systems reviewed and are negative.   Physical Exam Updated Vital Signs BP (!) 211/120   Pulse (!) 117   Temp 98.2 F (36.8 C) (Oral)   Resp (!) 22   LMP 06/10/2023 (Exact Date)   SpO2 97%  Physical Exam Vitals and nursing note reviewed.  Constitutional:      General: [name] not in acute distress.    Appearance: Normal appearance. [name] well-developed.  HENT:     Head: Normocephalic and atraumatic.  Eyes:     Conjunctiva/sclera: Conjunctivae normal.     Pupils: Pupils are equal, round, and reactive to light.  Cardiovascular:     Rate and Rhythm: Normal rate and regular rhythm.     Heart sounds: Normal heart sounds.  Pulmonary:     Effort: Pulmonary effort is normal. No respiratory distress.     Breath sounds: Normal breath sounds.  Abdominal:     General: There is no distension.     Palpations: Abdomen is soft.     Tenderness: There is no abdominal tenderness.  Musculoskeletal:        General: No deformity. Normal range of motion.     Cervical back: Normal range of motion and neck supple.  Skin:    General: Skin is warm and dry.   Neurological:     General: No focal deficit present.     Mental Status: [name] alert and oriented to person, place, and time.     ED Results / Procedures / Treatments   Labs (all labs ordered are listed, but only abnormal results are displayed) Labs Reviewed  CBG MONITORING, ED - Abnormal; Notable for the following components:      Result Value   Glucose-Capillary 491 (*)    All other components within normal limits  COMPREHENSIVE METABOLIC PANEL WITH GFR  CBC WITH DIFFERENTIAL/PLATELET  URINALYSIS, W/ REFLEX TO CULTURE (INFECTION SUSPECTED)  HCG, SERUM, QUALITATIVE  I-STAT CG4 LACTIC ACID, ED    EKG None  Radiology No results found.  Procedures Procedures  {Document cardiac monitor, telemetry assessment procedure when appropriate:1}  Medications Ordered in ED Medications  sodium chloride 0.9 % bolus 1,000 mL (has no administration in time range)  insulin aspart (novoLOG) injection 10 Units (10 Units Subcutaneous Given 06/28/23 1924)    ED Course/ Medical Decision Making/ A&P   {   Click here for ABCD2, HEART and other calculatorsREFRESH Note before signing :1}                              Medical Decision Making Amount and/or Complexity of Data Reviewed Labs: ordered. Radiology: ordered.  Risk OTC drugs. Prescription drug management.    Medical Screen Complete  This patient presented to the ED with complaint of chronic abdominal pain, nausea, weakness, muscle spasms, etc.  This complaint involves an extensive number of treatment options. The initial differential diagnosis includes, but is not limited to, metabolic abnormality, etc.  This presentation is: Acute, Chronic, Self-Limited, Previously Undiagnosed, Uncertain Prognosis, Complicated, Systemic Symptoms, and Threat to Life/Bodily Function Patient with multiple complaints.  Patient with extensive list of comorbidities and multiple recent evaluations for similar symptoms.  Patient is nontoxic in  appearance.  Patient reports compliance with previously prescribed medications. However, patient's blood pressure is elevated, her glucose is elevated to 479 on her chemistry.  I suspect some degree of noncompliance.  With antihypertensives  her blood pressure is improved.  With IV fluids and insulin her glucose is improved.  She was offered admission for further workup and treatment.  She declined same.  As to patient's reported abdominal pain.  This appears to be chronic in nature.  She has had multiple CT scans over the last several weeks without any definitive pathology identified.  With improvement of blood pressure sugar and hypertension patient appears to be comfortable and desires discharge.  She again declines additional observation and/or admission.  Importance of close follow-up is stressed.  Strict return precautions given understood.   Co morbidities that complicated the patient's evaluation  See HPI   Additional history obtained:  External records from outside sources obtained and reviewed including prior ED visits and prior Inpatient records.   Problem List / ED Course:  Hypertension, hyperglycemia    Reevaluation:  After the interventions noted above, I reevaluated the patient and found that they have: improved   Disposition:  After consideration of the diagnostic results and the patients response to treatment, I feel that the patent would benefit from close outpatient followup.    {Document critical care time when appropriate:1} {Document review of labs and clinical decision tools ie heart score, Chads2Vasc2 etc:1}  {Document your independent review of radiology images, and any outside records:1} {Document your discussion with family members, caretakers, and with consultants:1} {Document social determinants of health affecting pt's care:1} {Document your decision making why or why not admission, treatments were needed:1} Final Clinical Impression(s) / ED  Diagnoses Final diagnoses:  Hyperglycemia  Hypertension, unspecified type    Rx / DC Orders ED Discharge Orders     None

## 2023-06-30 ENCOUNTER — Emergency Department (HOSPITAL_COMMUNITY)
Admission: EM | Admit: 2023-06-30 | Discharge: 2023-06-30 | Disposition: A | Discharge status: Home / Self Care | Attending: Emergency Medicine | Admitting: Emergency Medicine

## 2023-06-30 ENCOUNTER — Other Ambulatory Visit: Payer: Self-pay

## 2023-06-30 ENCOUNTER — Emergency Department (HOSPITAL_COMMUNITY)

## 2023-06-30 ENCOUNTER — Encounter (HOSPITAL_COMMUNITY): Payer: Self-pay

## 2023-06-30 DIAGNOSIS — I130 Hypertensive heart and chronic kidney disease with heart failure and stage 1 through stage 4 chronic kidney disease, or unspecified chronic kidney disease: Secondary | ICD-10-CM | POA: Insufficient documentation

## 2023-06-30 DIAGNOSIS — I509 Heart failure, unspecified: Secondary | ICD-10-CM | POA: Diagnosis not present

## 2023-06-30 DIAGNOSIS — Z794 Long term (current) use of insulin: Secondary | ICD-10-CM | POA: Diagnosis not present

## 2023-06-30 DIAGNOSIS — E1143 Type 2 diabetes mellitus with diabetic autonomic (poly)neuropathy: Secondary | ICD-10-CM | POA: Insufficient documentation

## 2023-06-30 DIAGNOSIS — Z7982 Long term (current) use of aspirin: Secondary | ICD-10-CM | POA: Diagnosis not present

## 2023-06-30 DIAGNOSIS — E1165 Type 2 diabetes mellitus with hyperglycemia: Secondary | ICD-10-CM | POA: Insufficient documentation

## 2023-06-30 DIAGNOSIS — E1122 Type 2 diabetes mellitus with diabetic chronic kidney disease: Secondary | ICD-10-CM | POA: Insufficient documentation

## 2023-06-30 DIAGNOSIS — Z79899 Other long term (current) drug therapy: Secondary | ICD-10-CM | POA: Insufficient documentation

## 2023-06-30 DIAGNOSIS — N1832 Chronic kidney disease, stage 3b: Secondary | ICD-10-CM | POA: Diagnosis not present

## 2023-06-30 DIAGNOSIS — R739 Hyperglycemia, unspecified: Secondary | ICD-10-CM | POA: Diagnosis present

## 2023-06-30 LAB — COMPREHENSIVE METABOLIC PANEL WITH GFR
ALT: 134 U/L — ABNORMAL HIGH (ref 0–44)
AST: 181 U/L — ABNORMAL HIGH (ref 15–41)
Albumin: 4.3 g/dL (ref 3.5–5.0)
Alkaline Phosphatase: 119 U/L (ref 38–126)
Anion gap: 13 (ref 5–15)
BUN: 57 mg/dL — ABNORMAL HIGH (ref 6–20)
CO2: 18 mmol/L — ABNORMAL LOW (ref 22–32)
Calcium: 9.1 mg/dL (ref 8.9–10.3)
Chloride: 97 mmol/L — ABNORMAL LOW (ref 98–111)
Creatinine, Ser: 2.34 mg/dL — ABNORMAL HIGH (ref 0.44–1.00)
GFR, Estimated: 28 mL/min — ABNORMAL LOW (ref >60)
Glucose, Bld: 522 mg/dL (ref 70–99)
Potassium: 4.6 mmol/L (ref 3.5–5.1)
Sodium: 128 mmol/L — ABNORMAL LOW (ref 135–145)
Total Bilirubin: 0.7 mg/dL (ref 0.0–1.2)
Total Protein: 8.1 g/dL (ref 6.5–8.1)

## 2023-06-30 LAB — TROPONIN I (HIGH SENSITIVITY)
Troponin I (High Sensitivity): 23 ng/L — ABNORMAL HIGH (ref <18)
Troponin I (High Sensitivity): 25 ng/L — ABNORMAL HIGH (ref <18)

## 2023-06-30 LAB — BASIC METABOLIC PANEL WITH GFR
Anion gap: 13 (ref 5–15)
BUN: 54 mg/dL — ABNORMAL HIGH (ref 6–20)
CO2: 18 mmol/L — ABNORMAL LOW (ref 22–32)
Calcium: 9 mg/dL (ref 8.9–10.3)
Chloride: 105 mmol/L (ref 98–111)
Creatinine, Ser: 1.93 mg/dL — ABNORMAL HIGH (ref 0.44–1.00)
GFR, Estimated: 35 mL/min — ABNORMAL LOW (ref >60)
Glucose, Bld: 100 mg/dL — ABNORMAL HIGH (ref 70–99)
Potassium: 4.1 mmol/L (ref 3.5–5.1)
Sodium: 136 mmol/L (ref 135–145)

## 2023-06-30 LAB — CBG MONITORING, ED
Glucose-Capillary: 490 mg/dL — ABNORMAL HIGH (ref 70–99)
Glucose-Capillary: 170 mg/dL — ABNORMAL HIGH (ref 70–99)

## 2023-06-30 LAB — LIPASE, BLOOD: Lipase: 30 U/L (ref 11–51)

## 2023-06-30 LAB — CBC
HCT: 41 % (ref 36.0–46.0)
Hemoglobin: 13 g/dL (ref 12.0–15.0)
MCH: 30.7 pg (ref 26.0–34.0)
MCHC: 31.7 g/dL (ref 30.0–36.0)
MCV: 96.7 fL (ref 80.0–100.0)
Platelets: 196 10*3/uL (ref 150–400)
RBC: 4.24 MIL/uL (ref 3.87–5.11)
RDW: 14.9 % (ref 11.5–15.5)
WBC: 6.4 10*3/uL (ref 4.0–10.5)
nRBC: 0 % (ref 0.0–0.2)

## 2023-06-30 LAB — HCG, SERUM, QUALITATIVE: Preg, Serum: NEGATIVE

## 2023-06-30 MED ORDER — SODIUM CHLORIDE 0.9 % IV BOLUS
1000.0000 mL | Freq: Once | INTRAVENOUS | Status: AC
  Administered 2023-06-30: 1000 mL via INTRAVENOUS

## 2023-06-30 MED ORDER — SODIUM CHLORIDE 0.9 % IV SOLN: Freq: Once | INTRAVENOUS | Status: AC

## 2023-06-30 MED ORDER — LABETALOL HCL 5 MG/ML IV SOLN
5.0000 mg | Freq: Once | INTRAVENOUS | Status: AC
  Administered 2023-06-30: 5 mg via INTRAVENOUS
  Filled 2023-06-30: qty 4

## 2023-06-30 MED ORDER — INSULIN ASPART 100 UNIT/ML IJ SOLN
10.0000 [IU] | Freq: Once | INTRAMUSCULAR | Status: AC
  Administered 2023-06-30: 10 [IU] via SUBCUTANEOUS
  Filled 2023-06-30: qty 0.1

## 2023-06-30 NOTE — ED Notes (Signed)
 Patient transported to X-ray

## 2023-06-30 NOTE — ED Triage Notes (Signed)
 Pt arrived reporting chest pain and abdominal pain that started earlier today. Endorses nausea as well. No meds taken PTA. Denies dizziness, shob or any other concerns

## 2023-06-30 NOTE — Discharge Instructions (Addendum)
 Return for any problem.  ?

## 2023-06-30 NOTE — ED Provider Notes (Signed)
 [place] EMERGENCY DEPARTMENT AT [facility] Provider Note   CSN: [id number] Arrival date & time: 06/30/23  1621     History  Chief Complaint  Patient presents with   Chest Pain    Jennifer [name] is a 33 y.o. female.  33 y.o. female with medical history significant of hyperchylomicronemia, HFrEF, pancreatitis, astrocytoma, insulin-dependent type 2 diabetes with gastroparesis and peripheral neuropathy, hypertension, CKD 3B, pancreatic insufficiency presents with complaint of abdominal pain and chest pain.  Her symptoms appear to be chronic in nature.  She reports that she is having worse pain today than her last evaluation.  She reports compliance with insulin use.  She reports that her last dose of insulin was this morning.  Within several minutes of starting the interview for evaluation she requested narcotics for pain.  The history is provided by the patient.       Home Medications Prior to Admission medications   Medication Sig Start Date End Date Taking? Authorizing Provider  amitriptyline (ELAVIL) 10 MG tablet Take 10 mg by mouth at bedtime.    [provider]  aspirin EC 81 MG EC tablet Take 1 tablet (81 mg total) by mouth daily. Swallow whole. 12/07/20   Pennie Banter, DO  azelastine (ASTELIN) 0.1 % nasal spray Place 2 sprays into both nostrils daily at 6 (six) AM.    [provider]  BLISOVI FE 1.5/30 1.5-30 MG-MCG tablet Take 1 tablet by mouth daily.    [provider]  butalbital-acetaminophen-caffeine (FIORICET) 50-325-40 MG tablet Take 1 tablet by mouth daily as needed for headache. 09/26/22   [provider]  carvedilol (COREG) 3.125 MG tablet Take 1 tablet (3.125 mg total) by mouth 2 (two) times daily. 10/04/22   [name], [name], MD  CRANBERRY CONCENTRATE PO Take 2 tablets by mouth 2 (two) times daily.    [provider]  CREON 36000-114000 units CPEP capsule Take 1 capsule (36,000 Units total) by mouth  3 (three) times daily. 05/10/23   Rai, Delene Ruffini, MD  diclofenac (VOLTAREN) 75 MG EC tablet Take 1 tablet (75 mg total) by mouth 2 (two) times daily. 06/09/23   Candelaria Stagers, DPM  doxycycline (VIBRA-TABS) 100 MG tablet Take 1 tablet (100 mg total) by mouth daily. Take with food. 06/06/23 09/04/23  Terri Piedra, DO  DULoxetine (CYMBALTA) 60 MG capsule Take 60 mg by mouth at bedtime. 09/26/22   [provider]  ergocalciferol (VITAMIN D2) 1.25 MG (50000 UT) capsule Take 50,000 Units by mouth every 7 (seven) days. Every Sunday    [provider]  Evolocumab (REPATHA) 140 MG/ML SOSY Inject 140 mg into the skin every 14 (fourteen) days. Saturdays    [provider]  fenofibrate 54 MG tablet Take 1 tablet (54 mg total) by mouth daily. Patient taking differently: Take 54 mg by mouth at bedtime. 12/01/22   [name], [name], MD  hydrALAZINE (APRESOLINE) 50 MG tablet Take 1 tablet (50 mg total) by mouth 3 (three) times daily. 05/10/23   Rai, Ripudeep K, MD  insulin lispro (HUMALOG) 100 UNIT/ML injection Inject 30-40 Units into the skin See admin instructions. Inject subcutaneously 40 units with breakfast, 30 units with lunch and 30 units with dinner    [provider]  Insulin Pen Needle 32G X 4 MM MISC 1 Device by Does not apply route QID. For use with insulin pens 02/13/22   [name], MD  insulin regular human CONCENTRATED (HUMULIN R U-500 [facility])  500 UNIT/ML KwikPen Inject 185 Units into the skin 3 (three) times daily with meals. Patient taking differently: Inject 195 Units into the skin 2 (two) times daily with a meal. Inject subcutaneously 195 units with breakfast and lunch 09/20/22 01/09/24  [name], MD  ivabradine (CORLANOR) 7.5 MG TABS tablet Take 1 tablet (7.5 mg total) by mouth 2 (two) times daily with a meal. Patient taking differently: Take 7.5 mg by mouth at bedtime. 01/31/23   Milford, Anderson Malta, FNP  levocetirizine (XYZAL) 5 MG tablet Take 5 mg  by mouth daily. 03/05/22   [provider]  levothyroxine (SYNTHROID) 137 MCG tablet Take 137 mcg by mouth daily. 04/12/23   [provider]  metoCLOPramide (REGLAN) 5 MG tablet Take 1 tablet (5 mg total) by mouth every 8 (eight) hours as needed for nausea or vomiting. 05/10/23 05/09/24  Rai, Delene Ruffini, MD  oxyCODONE (ROXICODONE) 5 MG immediate release tablet Take 1 tablet (5 mg total) by mouth every 6 (six) hours as needed for severe pain (pain score 7-10). 06/18/23   Tanda Rockers A, DO  pantoprazole (PROTONIX) 40 MG tablet Take 40 mg by mouth daily. 12/24/19   [provider]  polyethylene glycol (MIRALAX / GLYCOLAX) 17 g packet Take 17 g by mouth daily as needed for mild constipation. Also available OTC 05/10/23   Rai, Ripudeep K, MD  pregabalin (LYRICA) 300 MG capsule Take 300 mg by mouth 2 (two) times daily.    [provider]  prochlorperazine (COMPAZINE) 10 MG tablet Take 10 mg by mouth at bedtime.    [provider]  promethazine (PHENERGAN) 25 MG suppository Place 1 suppository (25 mg total) rectally every 6 (six) hours as needed for nausea or vomiting. 06/22/23   Marita Kansas, PA-C  promethazine (PHENERGAN) 25 MG tablet Take 1 tablet (25 mg total) by mouth every 6 (six) hours as needed for nausea or vomiting. 06/21/23   Lonell Grandchild, MD  rifaximin (XIFAXAN) 550 MG TABS tablet Take 1 tablet (550 mg total) by mouth 3 (three) times daily. 05/10/23   Rai, Delene Ruffini, MD  rosuvastatin (CRESTOR) 40 MG tablet Take 40 mg by mouth daily. 08/09/21   [provider]  Safety Seal Miscellaneous MISC Hormonic Hair Solution with minoxidil USP 7% and finasteride USP 0.05% - apply to affected areas daily every morning. 06/06/23   Terri Piedra, DO  sodium bicarbonate 650 MG tablet Take 2 tablets (1,300 mg total) by mouth 2 (two) times daily. 05/10/23   Rai, Delene Ruffini, MD  sucralfate (CARAFATE) 1 g tablet Take 1 tablet (1 g total) by mouth with breakfast,  with lunch, and with evening meal for 7 days. 06/18/23 06/25/23  Sloan Leiter, DO  SUMAtriptan (IMITREX) 25 MG tablet Take 25 mg by mouth every 2 (two) hours as needed. 01/30/23   [provider]  tamsulosin (FLOMAX) 0.4 MG CAPS capsule Take 1 capsule (0.4 mg total) by mouth daily. 05/10/23   Rai, Delene Ruffini, MD  topiramate (TOPAMAX) 50 MG tablet Take 50 mg by mouth 2 (two) times daily.    [provider]  torsemide (DEMADEX) 20 MG tablet Take 2 tablets (40 mg total) by mouth daily. May take 1 additional tablet as needed Patient taking differently: Take 20-40 mg by mouth See admin instructions. Take 40mg  daily. May take 1 additional tablet as needed for swelling. 10/05/22   [name], [name], MD  tretinoin (RETIN-A) 0.05 % cream Apply 1 application topically at bedtime.  01/17/21   [provider]      Allergies    Icosapent ethyl (epa ethyl ester) (fish), Ketamine, [place], Morphine, Penicillins, Clarithromycin, Erythromycin, Fentanyl, Penicillin v, Shellfish allergy, and Prednisone    Review of Systems   Review of Systems  All other systems reviewed and are negative.   Physical Exam Updated Vital Signs BP (!) 173/95   Pulse (!) 123   Temp 98.4 F (36.9 C)   Resp 18   Ht 4\' 9"  (1.448 m)   Wt 65 kg   LMP 06/10/2023 (Exact Date)   SpO2 98%   BMI 31.01 kg/m  Physical Exam Vitals and nursing note reviewed.  Constitutional:      General: [name] not in acute distress.    Appearance: Normal appearance. [name] well-developed.  HENT:     Head: Normocephalic and atraumatic.  Eyes:     Conjunctiva/sclera: Conjunctivae normal.     Pupils: Pupils are equal, round, and reactive to light.  Cardiovascular:     Rate and Rhythm: Regular rhythm. Tachycardia present.     Heart sounds: Normal heart sounds.  Pulmonary:     Effort: Pulmonary effort is normal. No respiratory distress.     Breath sounds: Normal breath sounds.  Abdominal:     General: There is no  distension.     Palpations: Abdomen is soft.     Tenderness: There is no abdominal tenderness.  Musculoskeletal:        General: No deformity. Normal range of motion.     Cervical back: Normal range of motion and neck supple.  Skin:    General: Skin is warm and dry.  Neurological:     General: No focal deficit present.     Mental Status: [name] alert and oriented to person, place, and time.     ED Results / Procedures / Treatments   Labs (all labs ordered are listed, but only abnormal results are displayed) Labs Reviewed  CBC  HCG, SERUM, QUALITATIVE  COMPREHENSIVE METABOLIC PANEL WITH GFR  LIPASE, BLOOD  CBG MONITORING, ED  TROPONIN I (HIGH SENSITIVITY)    EKG None  Radiology DG Chest Port 1 View Result Date: 06/28/2023 CLINICAL DATA:  Shortness of breath EXAM: PORTABLE CHEST 1 VIEW COMPARISON:  04/21/2023 FINDINGS: Stable cardiomediastinal silhouette. CardioMEMS device. No focal consolidation, pleural effusion, or pneumothorax. No displaced rib fractures. IMPRESSION: No active disease. Electronically Signed   By: Minerva Fester M.D.   On: 06/28/2023 20:18    Procedures Procedures    Medications Ordered in ED Medications - No data to display  ED Course/ Medical Decision Making/ A&P                                 Medical Decision Making Amount and/or Complexity of Data Reviewed Labs: ordered. Radiology: ordered.  Risk Prescription drug management.    Medical Screen Complete  This patient presented to the ED with complaint of abdominal pain, chronic.  This complaint involves an extensive number of treatment options. The initial differential diagnosis includes, but is not limited to, metabolic abnormality, hypoglycemia, etc.  This presentation is: Chronic, Self-Limited, Previously Undiagnosed, Uncertain Prognosis, Complicated, Systemic Symptoms, and Threat to Life/Bodily Function  Patient presents with complaint of abdominal pain.  Patient complaint is  chronic in nature.  She was seen by the same provider 2 days ago for the same issue.  She reports today that she is compliant  with medications including insulin.  She specifically asked for narcotics on arrival.  Pain workup is most significant for elevated glucose.  This resolved easily with IV fluids and minimal amount of subcutaneous insulin.  Patient is improved after treatment.  [name] appropriate for discharge.  Importance of close follow-up stressed.  Strict return precautions given and understood.   Co morbidities that complicated the patient's evaluation  See HPI   Additional history obtained:  External records from outside sources obtained and reviewed including prior ED visits and prior Inpatient records.   Problem List / ED Course:  Chronic abdominal pain, hyperglycemia   Reevaluation:  After the interventions noted above, I reevaluated the patient and found that they have: improved   Disposition:  After consideration of the diagnostic results and the patients response to treatment, I feel that the patent would benefit from close outpatient follow-up.          Final Clinical Impression(s) / ED Diagnoses Final diagnoses:  Hyperglycemia    Rx / DC Orders ED Discharge Orders     None         Wynetta Fines, MD 06/30/23 2217

## 2023-07-01 ENCOUNTER — Telehealth: Payer: Self-pay | Admitting: Cardiothoracic Surgery

## 2023-07-01 ENCOUNTER — Encounter (HOSPITAL_BASED_OUTPATIENT_CLINIC_OR_DEPARTMENT_OTHER): Payer: Self-pay

## 2023-07-01 ENCOUNTER — Other Ambulatory Visit: Payer: Self-pay

## 2023-07-01 ENCOUNTER — Emergency Department (HOSPITAL_BASED_OUTPATIENT_CLINIC_OR_DEPARTMENT_OTHER)

## 2023-07-01 DIAGNOSIS — R519 Headache, unspecified: Secondary | ICD-10-CM | POA: Diagnosis present

## 2023-07-01 DIAGNOSIS — R079 Chest pain, unspecified: Secondary | ICD-10-CM | POA: Diagnosis present

## 2023-07-01 DIAGNOSIS — N184 Chronic kidney disease, stage 4 (severe): Secondary | ICD-10-CM | POA: Insufficient documentation

## 2023-07-01 DIAGNOSIS — E039 Hypothyroidism, unspecified: Secondary | ICD-10-CM | POA: Diagnosis not present

## 2023-07-01 DIAGNOSIS — I130 Hypertensive heart and chronic kidney disease with heart failure and stage 1 through stage 4 chronic kidney disease, or unspecified chronic kidney disease: Secondary | ICD-10-CM | POA: Insufficient documentation

## 2023-07-01 DIAGNOSIS — R110 Nausea: Secondary | ICD-10-CM | POA: Diagnosis not present

## 2023-07-01 DIAGNOSIS — I159 Secondary hypertension, unspecified: Secondary | ICD-10-CM | POA: Diagnosis not present

## 2023-07-01 DIAGNOSIS — I509 Heart failure, unspecified: Secondary | ICD-10-CM | POA: Diagnosis not present

## 2023-07-01 DIAGNOSIS — Z794 Long term (current) use of insulin: Secondary | ICD-10-CM | POA: Diagnosis not present

## 2023-07-01 DIAGNOSIS — E1122 Type 2 diabetes mellitus with diabetic chronic kidney disease: Secondary | ICD-10-CM | POA: Insufficient documentation

## 2023-07-01 DIAGNOSIS — E119 Type 2 diabetes mellitus without complications: Secondary | ICD-10-CM | POA: Insufficient documentation

## 2023-07-01 DIAGNOSIS — Z7982 Long term (current) use of aspirin: Secondary | ICD-10-CM | POA: Diagnosis not present

## 2023-07-01 DIAGNOSIS — Z79899 Other long term (current) drug therapy: Secondary | ICD-10-CM | POA: Diagnosis not present

## 2023-07-01 NOTE — Telephone Encounter (Signed)
 Received call from pt asking for help with BP.  Has been high all week, today 176/122, pulse 130s (she states typical pulse in the 100s). In last week, has been as high as 240s systolic Doesn't feel good but no specific symptoms. Reports adherence to meds, including hydral 50 tid, carvedilol, ivabradine, and torsemide.  She took an extra dose of hydralazine which helped bring BP down temporarily to the 120s/100s but it went back up to 170s again.  Instructed her to double her dose of carvedilol to 6.25 bid. Ok to use prn hydralazine for SBPs>160.

## 2023-07-01 NOTE — ED Triage Notes (Signed)
 Pt presents via POV c/o "unable to regulate BP". Reports took 3x her dosage of BP medication and was told by PCP to come to ED to make sure her BP doesn't bottom out.   A&O x4.   Pt well known to this department with 18 visits in the last 6 months.

## 2023-07-02 ENCOUNTER — Emergency Department (HOSPITAL_COMMUNITY)
Admission: EM | Admit: 2023-07-02 | Discharge: 2023-07-02 | Disposition: A | Discharge status: Home / Self Care | Attending: Emergency Medicine | Admitting: Emergency Medicine

## 2023-07-02 ENCOUNTER — Emergency Department (HOSPITAL_COMMUNITY)

## 2023-07-02 ENCOUNTER — Telehealth: Payer: Self-pay | Admitting: Student

## 2023-07-02 ENCOUNTER — Emergency Department (HOSPITAL_BASED_OUTPATIENT_CLINIC_OR_DEPARTMENT_OTHER)

## 2023-07-02 ENCOUNTER — Encounter (HOSPITAL_COMMUNITY): Payer: Self-pay

## 2023-07-02 ENCOUNTER — Emergency Department (HOSPITAL_BASED_OUTPATIENT_CLINIC_OR_DEPARTMENT_OTHER)
Admission: EM | Admit: 2023-07-02 | Discharge: 2023-07-02 | Disposition: A | Discharge status: Home / Self Care | Attending: Emergency Medicine | Admitting: Emergency Medicine

## 2023-07-02 DIAGNOSIS — Z794 Long term (current) use of insulin: Secondary | ICD-10-CM | POA: Insufficient documentation

## 2023-07-02 DIAGNOSIS — I159 Secondary hypertension, unspecified: Secondary | ICD-10-CM | POA: Insufficient documentation

## 2023-07-02 DIAGNOSIS — N189 Chronic kidney disease, unspecified: Secondary | ICD-10-CM | POA: Insufficient documentation

## 2023-07-02 DIAGNOSIS — R519 Headache, unspecified: Secondary | ICD-10-CM

## 2023-07-02 DIAGNOSIS — E109 Type 1 diabetes mellitus without complications: Secondary | ICD-10-CM | POA: Insufficient documentation

## 2023-07-02 DIAGNOSIS — R110 Nausea: Secondary | ICD-10-CM | POA: Insufficient documentation

## 2023-07-02 DIAGNOSIS — Z7982 Long term (current) use of aspirin: Secondary | ICD-10-CM | POA: Insufficient documentation

## 2023-07-02 DIAGNOSIS — I10 Essential (primary) hypertension: Secondary | ICD-10-CM

## 2023-07-02 DIAGNOSIS — I509 Heart failure, unspecified: Secondary | ICD-10-CM | POA: Insufficient documentation

## 2023-07-02 DIAGNOSIS — I130 Hypertensive heart and chronic kidney disease with heart failure and stage 1 through stage 4 chronic kidney disease, or unspecified chronic kidney disease: Secondary | ICD-10-CM | POA: Insufficient documentation

## 2023-07-02 DIAGNOSIS — R079 Chest pain, unspecified: Secondary | ICD-10-CM

## 2023-07-02 LAB — CBC
HCT: 40.6 % (ref 36.0–46.0)
Hemoglobin: 13.2 g/dL (ref 12.0–15.0)
MCH: 31 pg (ref 26.0–34.0)
MCHC: 32.5 g/dL (ref 30.0–36.0)
MCV: 95.3 fL (ref 80.0–100.0)
Platelets: 196 10*3/uL (ref 150–400)
RBC: 4.26 MIL/uL (ref 3.87–5.11)
RDW: 14.8 % (ref 11.5–15.5)
WBC: 6.4 10*3/uL (ref 4.0–10.5)
nRBC: 0 % (ref 0.0–0.2)

## 2023-07-02 LAB — BASIC METABOLIC PANEL WITH GFR
Anion gap: 13 (ref 5–15)
BUN: 48 mg/dL — ABNORMAL HIGH (ref 6–20)
CO2: 18 mmol/L — ABNORMAL LOW (ref 22–32)
Calcium: 10.2 mg/dL (ref 8.9–10.3)
Chloride: 101 mmol/L (ref 98–111)
Creatinine, Ser: 1.74 mg/dL — ABNORMAL HIGH (ref 0.44–1.00)
GFR, Estimated: 39 mL/min — ABNORMAL LOW (ref >60)
Glucose, Bld: 333 mg/dL — ABNORMAL HIGH (ref 70–99)
Potassium: 4 mmol/L (ref 3.5–5.1)
Sodium: 132 mmol/L — ABNORMAL LOW (ref 135–145)

## 2023-07-02 LAB — HCG, QUANTITATIVE, PREGNANCY: hCG, Beta Chain, Quant, S: <1 m[IU]/mL (ref <5)

## 2023-07-02 LAB — TROPONIN I (HIGH SENSITIVITY)
Troponin I (High Sensitivity): 29 ng/L — ABNORMAL HIGH (ref <18)
Troponin I (High Sensitivity): 30 ng/L — ABNORMAL HIGH (ref <18)

## 2023-07-02 LAB — CBG MONITORING, ED: Glucose-Capillary: 191 mg/dL — ABNORMAL HIGH (ref 70–99)

## 2023-07-02 MED ORDER — ONDANSETRON 4 MG PO TBDP
4.0000 mg | ORAL_TABLET | ORAL | Status: AC
  Administered 2023-07-02: 4 mg via ORAL
  Filled 2023-07-02: qty 1

## 2023-07-02 MED ORDER — CARVEDILOL 6.25 MG PO TABS: 6.2500 mg | ORAL_TABLET | Freq: Two times a day (BID) | ORAL | 2 refills | Status: DC

## 2023-07-02 MED ORDER — ACETAMINOPHEN 500 MG PO TABS
1000.0000 mg | ORAL_TABLET | ORAL | Status: AC
  Administered 2023-07-02: 1000 mg via ORAL
  Filled 2023-07-02: qty 2

## 2023-07-02 MED ORDER — HYDRALAZINE HCL 25 MG PO TABS
50.0000 mg | ORAL_TABLET | ORAL | Status: AC
Start: 2023-07-02 — End: 2023-07-02
  Administered 2023-07-02: 50 mg via ORAL
  Filled 2023-07-02: qty 2

## 2023-07-02 MED ORDER — ONDANSETRON HCL 4 MG/2ML IJ SOLN
4.0000 mg | Freq: Once | INTRAMUSCULAR | Status: AC
  Administered 2023-07-02: 4 mg via INTRAVENOUS
  Filled 2023-07-02: qty 2

## 2023-07-02 MED ORDER — HYDRALAZINE HCL 20 MG/ML IJ SOLN
10.0000 mg | Freq: Once | INTRAMUSCULAR | Status: AC
  Administered 2023-07-02: 10 mg via INTRAVENOUS
  Filled 2023-07-02: qty 1

## 2023-07-02 MED ORDER — ALUM & MAG HYDROXIDE-SIMETH 200-200-20 MG/5ML PO SUSP
15.0000 mL | Freq: Once | ORAL | Status: AC
Start: 2023-07-02 — End: 2023-07-02
  Administered 2023-07-02: 15 mL via ORAL
  Filled 2023-07-02: qty 30

## 2023-07-02 MED ORDER — DIPHENHYDRAMINE HCL 50 MG/ML IJ SOLN
12.5000 mg | Freq: Once | INTRAMUSCULAR | Status: AC
  Administered 2023-07-02: 12.5 mg via INTRAVENOUS
  Filled 2023-07-02: qty 1

## 2023-07-02 MED ORDER — LABETALOL HCL 5 MG/ML IV SOLN
20.0000 mg | Freq: Once | INTRAVENOUS | Status: AC
  Administered 2023-07-02: 20 mg via INTRAVENOUS
  Filled 2023-07-02: qty 4

## 2023-07-02 MED ORDER — ACETAMINOPHEN 500 MG PO TABS
1000.0000 mg | ORAL_TABLET | Freq: Once | ORAL | Status: AC
  Administered 2023-07-02: 1000 mg via ORAL
  Filled 2023-07-02: qty 2

## 2023-07-02 NOTE — ED Triage Notes (Signed)
 Pt presents with uncontrolled HTN today. She reports numbers of 220/110 at home. She reports that she had a HA, ShOB, and CP that woke her from her sleep this AM. HA is described as sharp and constant and is on the R side and the CP is dull and constant on the L side. Pt has had multiple recent ED visits for the same. She denies any medication changes in the last 4 months.

## 2023-07-02 NOTE — Telephone Encounter (Signed)
 Patient called the after-hours answering service reporting that she had a blood pressure of 233/189 and that she has not been feeling well.  Patient went to the emergency department yesterday for her high blood pressure.  Reported that she has been taking her blood pressure medications as prescribed.  Encouraged her to go to the emergency department and advised against driving herself because of how elevated her blood pressures are.

## 2023-07-02 NOTE — Discharge Instructions (Signed)
 You were seen today for ongoing hypertension.  It is important you take your medications as prescribed.  Follow-up with your primary doctor.  Keep a log of your blood pressures at home.

## 2023-07-02 NOTE — ED Provider Notes (Signed)
 [place] EMERGENCY DEPARTMENT AT [facility] Provider Note   CSN: [id number] Arrival date & time: 07/02/23  1629     History {Add pertinent medical, surgical, social history, OB history to HPI:1} Chief Complaint  Patient presents with   Hypertension    Jennifer [name] is a 33 y.o. female.  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  Patient reports this morning she woke up with a headache.  Describes it as holocephalic.  Has difficulty characterizing it.  Says that she typically does not get headaches but does have a documented history of headaches and was prescribed sumatriptan for her in the past.  Also says she had chest pain.  Says the chest pain started at 12 noon today.  Describes it as left-sided rating down her left arm.  Sharp.  6/10 currently.  Exertional and pruritic.  Mild shortness of breath.  No cough.  Called her cardiologist and told them that her blood pressure was elevated and they referred her to the emergency department for evaluation.  Of note she was in the emergency department yesterday with hypertension.  Also in the emergency department on 4/4 with chest pain workup that showed elevated but flat troponins.       Home Medications Prior to Admission medications   Medication Sig Start Date End Date Taking? Authorizing Provider  amitriptyline (ELAVIL) 10 MG tablet Take 10 mg by mouth at bedtime.    [provider]  aspirin EC 81 MG EC tablet Take 1 tablet (81 mg total) by mouth daily. Swallow whole. 12/07/20   Pennie Banter, DO  azelastine (ASTELIN) 0.1 % nasal spray Place 2 sprays into both nostrils daily at 6 (six) AM.    [provider]  BLISOVI FE 1.5/30 1.5-30 MG-MCG tablet Take 1 tablet by mouth daily.    [provider]   butalbital-acetaminophen-caffeine (FIORICET) 50-325-40 MG tablet Take 1 tablet by mouth daily as needed for headache. 09/26/22   [provider]  carvedilol (COREG) 3.125 MG tablet Take 1 tablet (3.125 mg total) by mouth 2 (two) times daily. 10/04/22   [name], [name], MD  CRANBERRY CONCENTRATE PO Take 2 tablets by mouth 2 (two) times daily.    [provider]  CREON 36000-114000 units CPEP capsule Take 1 capsule (36,000 Units total) by mouth 3 (three) times daily. 05/10/23   Rai, Delene Ruffini, MD  diclofenac (VOLTAREN) 75 MG EC tablet Take 1 tablet (75 mg total) by mouth 2 (two) times daily. 06/09/23   Candelaria Stagers, DPM  doxycycline (VIBRA-TABS) 100 MG tablet Take 1 tablet (100 mg total) by mouth daily. Take with food. 06/06/23 09/04/23  Terri Piedra, DO  DULoxetine (CYMBALTA) 60 MG capsule Take 60 mg by mouth at bedtime. 09/26/22   [provider]  ergocalciferol (VITAMIN D2) 1.25 MG (50000 UT) capsule Take 50,000 Units by mouth every 7 (seven) days. Every Sunday    [provider]  Evolocumab (REPATHA) 140 MG/ML SOSY Inject 140 mg into the skin every 14 (fourteen) days. Saturdays    [provider]  fenofibrate 54 MG tablet Take 1 tablet (54 mg total) by mouth daily. Patient taking differently: Take 54 mg by mouth at bedtime. 12/01/22   [name], [name], MD  hydrALAZINE (APRESOLINE) 50 MG tablet Take 1 tablet (50 mg total) by mouth 3 (three) times daily.  05/10/23   Rai, Ripudeep K, MD  insulin lispro (HUMALOG) 100 UNIT/ML injection Inject 30-40 Units into the skin See admin instructions. Inject subcutaneously 40 units with breakfast, 30 units with lunch and 30 units with dinner    [provider]  Insulin Pen Needle 32G X 4 MM MISC 1 Device by Does not apply route QID. For use with insulin pens 02/13/22   [name], MD  insulin regular human CONCENTRATED (HUMULIN R U-500 KWIKPEN) 500 UNIT/ML KwikPen Inject 185 Units into the skin 3 (three)  times daily with meals. Patient taking differently: Inject 195 Units into the skin 2 (two) times daily with a meal. Inject subcutaneously 195 units with breakfast and lunch 09/20/22 01/09/24  [name], MD  ivabradine (CORLANOR) 7.5 MG TABS tablet Take 1 tablet (7.5 mg total) by mouth 2 (two) times daily with a meal. Patient taking differently: Take 7.5 mg by mouth at bedtime. 01/31/23   Milford, Anderson Malta, FNP  levocetirizine (XYZAL) 5 MG tablet Take 5 mg by mouth daily. 03/05/22   [provider]  levothyroxine (SYNTHROID) 137 MCG tablet Take 137 mcg by mouth daily. 04/12/23   [provider]  metoCLOPramide (REGLAN) 5 MG tablet Take 1 tablet (5 mg total) by mouth every 8 (eight) hours as needed for nausea or vomiting. 05/10/23 05/09/24  Rai, Delene Ruffini, MD  oxyCODONE (ROXICODONE) 5 MG immediate release tablet Take 1 tablet (5 mg total) by mouth every 6 (six) hours as needed for severe pain (pain score 7-10). 06/18/23   Tanda Rockers A, DO  pantoprazole (PROTONIX) 40 MG tablet Take 40 mg by mouth daily. 12/24/19   [provider]  polyethylene glycol (MIRALAX / GLYCOLAX) 17 g packet Take 17 g by mouth daily as needed for mild constipation. Also available OTC 05/10/23   Rai, Ripudeep K, MD  pregabalin (LYRICA) 300 MG capsule Take 300 mg by mouth 2 (two) times daily.    [provider]  prochlorperazine (COMPAZINE) 10 MG tablet Take 10 mg by mouth at bedtime.    [provider]  promethazine (PHENERGAN) 25 MG suppository Place 1 suppository (25 mg total) rectally every 6 (six) hours as needed for nausea or vomiting. 06/22/23   Marita Kansas, PA-C  promethazine (PHENERGAN) 25 MG tablet Take 1 tablet (25 mg total) by mouth every 6 (six) hours as needed for nausea or vomiting. 06/21/23   Lonell Grandchild, MD  rifaximin (XIFAXAN) 550 MG TABS tablet Take 1 tablet (550 mg total) by mouth 3 (three) times daily. 05/10/23   Rai, Delene Ruffini, MD  rosuvastatin (CRESTOR)  40 MG tablet Take 40 mg by mouth daily. 08/09/21   [provider]  Safety Seal Miscellaneous MISC Hormonic Hair Solution with minoxidil USP 7% and finasteride USP 0.05% - apply to affected areas daily every morning. 06/06/23   Terri Piedra, DO  sodium bicarbonate 650 MG tablet Take 2 tablets (1,300 mg total) by mouth 2 (two) times daily. 05/10/23   Rai, Delene Ruffini, MD  sucralfate (CARAFATE) 1 g tablet Take 1 tablet (1 g total) by mouth with breakfast, with lunch, and with evening meal for 7 days. 06/18/23 06/25/23  Sloan Leiter, DO  SUMAtriptan (IMITREX) 25 MG tablet Take 25 mg by mouth every 2 (two) hours as needed. 01/30/23   [provider]  tamsulosin (FLOMAX) 0.4 MG CAPS capsule Take 1 capsule (0.4 mg total) by mouth daily. 05/10/23   Rai, Delene Ruffini, MD  topiramate (TOPAMAX)  50 MG tablet Take 50 mg by mouth 2 (two) times daily.    [provider]  torsemide (DEMADEX) 20 MG tablet Take 2 tablets (40 mg total) by mouth daily. May take 1 additional tablet as needed Patient taking differently: Take 20-40 mg by mouth See admin instructions. Take 40mg  daily. May take 1 additional tablet as needed for swelling. 10/05/22   [name], [name], MD  tretinoin (RETIN-A) 0.05 % cream Apply 1 application topically at bedtime. 01/17/21   [provider]      Allergies    Icosapent ethyl (epa ethyl ester) (fish), Ketamine, [place], Morphine, Penicillins, Clarithromycin, Erythromycin, Fentanyl, Penicillin v, Shellfish allergy, and Prednisone    Review of Systems   Review of Systems  Physical Exam Updated Vital Signs BP (!) 215/117 (BP Location: Right Arm)   Pulse (!) 124   Temp 97.9 F (36.6 C) (Oral)   Resp (!) 22   Ht 4\' 9"  (1.448 m)   Wt 65.3 kg   LMP 06/10/2023 (Exact Date)   SpO2 95%   BMI 31.16 kg/m  Physical Exam Vitals and nursing note reviewed.  Constitutional:      General: [name] not in acute distress.    Appearance: [name] well-developed.   HENT:     Head: Normocephalic and atraumatic.     Right Ear: External ear normal.     Left Ear: External ear normal.     Nose: Nose normal.  Eyes:     Extraocular Movements: Extraocular movements intact.     Conjunctiva/sclera: Conjunctivae normal.     Pupils: Pupils are equal, round, and reactive to light.  Cardiovascular:     Rate and Rhythm: Normal rate and regular rhythm.     Heart sounds: No murmur heard. Pulmonary:     Effort: Pulmonary effort is normal. No respiratory distress.     Breath sounds: Normal breath sounds.  Musculoskeletal:     Cervical back: Normal range of motion and neck supple.     Right lower leg: No edema.     Left lower leg: No edema.  Skin:    General: Skin is warm and dry.  Neurological:     Mental Status: [name] alert and oriented to person, place, and time. Mental status is at baseline.     Comments: MENTAL STATUS: AAOx3 CRANIAL NERVES: II: Left pupil 4 mm.  Ptosis of the right eye III, IV, VI: EOM intact of left eye, EOM limited in R eye which is chronic, no gaze preference or deviation, no nystagmus. V: normal sensation to light touch in V1, V2, and V3 segments bilaterally VII: no facial weakness or asymmetry, no nasolabial fold flattening VIII: normal hearing to speech and finger friction IX, X: normal palatal elevation, no uvular deviation XI: 5/5 head turn and 5/5 shoulder shrug bilaterally XII: midline tongue protrusion MOTOR: 4/5 strength in R shoulder flexion, elbow flexion and extension, and grip strength which is chronic.  Also has contractures on the side. 5/5 strength in L shoulder flexion, elbow flexion and extension, and grip strength.  4/5 strength in R hip and knee flexion, knee extension, ankle plantar and dorsiflexion which is chronic with contractures. 5/5 strength in L hip and knee flexion, knee extension, ankle plantar and dorsiflexion. SENSORY: Normal sensation to light touch in all extremities COORD: Normal finger to nose and  heel to shin, no tremor, no dysmetria  Psychiatric:        Mood and Affect: Mood normal.  ED Results / Procedures / Treatments   Labs (all labs ordered are listed, but only abnormal results are displayed) Labs Reviewed  CBC  BASIC METABOLIC PANEL WITH GFR  HCG, QUANTITATIVE, PREGNANCY  TROPONIN I (HIGH SENSITIVITY)    EKG None  Radiology DG Chest Port 1 View Result Date: 07/02/2023 CLINICAL DATA:  Irregular blood pressure. EXAM: PORTABLE CHEST 1 VIEW COMPARISON:  June 30, 2023 FINDINGS: The heart size and mediastinal contours are within normal limits. A CardioMEMS device is noted. Both lungs are clear. The visualized skeletal structures are unremarkable. IMPRESSION: No acute cardiopulmonary disease. Electronically Signed   By: Aram Candela M.D.   On: 07/02/2023 00:26    Procedures Procedures  {Document cardiac monitor, telemetry assessment procedure when appropriate:1}  Medications Ordered in ED Medications - No data to display  ED Course/ Medical Decision Making/ A&P Clinical Course as of 07/02/23 1745  Sun Jul 02, 2023  1732 Creatinine(!): 1.74 At baseline [RP]    Clinical Course User Index [RP] Rondel Baton, MD   {   Click here for ABCD2, HEART and other calculatorsREFRESH Note before signing :1}                              Medical Decision Making Amount and/or Complexity of Data Reviewed Labs: ordered. Decision-making details documented in ED Course. Radiology: ordered.  Risk OTC drugs. Prescription drug management.   ***  {Document critical care time when appropriate:1} {Document review of labs and clinical decision tools ie heart score, Chads2Vasc2 etc:1}  {Document your independent review of radiology images, and any outside records:1} {Document your discussion with family members, caretakers, and with consultants:1} {Document social determinants of health affecting pt's care:1} {Document your decision making why or why not admission,  treatments were needed:1} Final Clinical Impression(s) / ED Diagnoses Final diagnoses:  None    Rx / DC Orders ED Discharge Orders     None

## 2023-07-02 NOTE — Discharge Instructions (Signed)
 You were seen for your headache, chest pain, and elevated blood pressure in the emergency department.   At home, please take the increased dose of your coreg.    Check your MyChart online for the results of any tests that had not resulted by the time you left the emergency department.   Follow-up with your primary doctor in 2-3 days regarding your visit.    Return immediately to the emergency department if you experience any of the following: worsening chest pain, difficulty breathing, or any other concerning symptoms.    Thank you for visiting our Emergency Department. It was a pleasure taking care of you today.

## 2023-07-02 NOTE — ED Provider Notes (Signed)
 [place] EMERGENCY DEPARTMENT AT [facility] Provider Note   CSN: [id number] Arrival date & time: 07/01/23  2249     History  Chief Complaint  Patient presents with   Hypertension    Daryle Boyington is a 33 y.o. female.  HPI     This is a 33 year old female who presents with concerns for hypertension.  Patient reports that her blood pressure has been up over the last 24 hours.  She states that she took a total of 450 mg of hydralazine.  Normal dosage was 150 and she took 6.25 of her carvedilol.  She called her primary doctor who requested that she come be monitored so that her blood pressure did not "take."  She denies any symptoms including chest pain or shortness of breath.  States that she just feels "off."  [name] unsure whether her blood sugar may be up.  Of note, she was seen and evaluated yesterday and had a full chest pain workup.  She at baseline is hypertensive.  Blood pressure yesterday 170s over 90s.  Previous ED visit on 4/2 blood pressure 211/120.  Home Medications Prior to Admission medications   Medication Sig Start Date End Date Taking? Authorizing Provider  amitriptyline (ELAVIL) 10 MG tablet Take 10 mg by mouth at bedtime.    [provider]  aspirin EC 81 MG EC tablet Take 1 tablet (81 mg total) by mouth daily. Swallow whole. 12/07/20   Pennie Banter, DO  azelastine (ASTELIN) 0.1 % nasal spray Place 2 sprays into both nostrils daily at 6 (six) AM.    [provider]  BLISOVI FE 1.5/30 1.5-30 MG-MCG tablet Take 1 tablet by mouth daily.    [provider]  butalbital-acetaminophen-caffeine (FIORICET) 50-325-40 MG tablet Take 1 tablet by mouth daily as needed for headache. 09/26/22   [provider]  carvedilol (COREG) 3.125 MG tablet Take 1 tablet (3.125 mg total) by mouth 2 (two) times daily. 10/04/22   [name], [name], MD  CRANBERRY CONCENTRATE PO Take 2 tablets by mouth 2 (two) times daily.    [provider]  CREON 36000-114000 units CPEP capsule Take 1 capsule (36,000 Units total) by mouth 3 (three) times daily. 05/10/23   Rai, Delene Ruffini, MD  diclofenac (VOLTAREN) 75 MG EC tablet Take 1 tablet (75 mg total) by mouth 2 (two) times daily. 06/09/23   Candelaria Stagers, DPM  doxycycline (VIBRA-TABS) 100 MG tablet Take 1 tablet (100 mg total) by mouth daily. Take with food. 06/06/23 09/04/23  Terri Piedra, DO  DULoxetine (CYMBALTA) 60 MG capsule Take 60 mg by mouth at bedtime. 09/26/22   [provider]  ergocalciferol (VITAMIN D2) 1.25 MG (50000 UT) capsule Take 50,000 Units by mouth every 7 (seven) days. Every Sunday    [provider]  Evolocumab (REPATHA) 140 MG/ML SOSY Inject 140 mg into the skin every 14 (fourteen) days. Saturdays    [provider]  fenofibrate 54 MG tablet Take 1 tablet (54 mg total) by mouth daily. Patient taking differently: Take 54 mg by mouth at bedtime. 12/01/22   [name], [name], MD  hydrALAZINE (APRESOLINE) 50 MG tablet Take 1 tablet (50 mg total) by mouth 3 (three) times daily. 05/10/23   Rai, Ripudeep K, MD  insulin lispro (HUMALOG) 100 UNIT/ML injection Inject 30-40 Units into the skin See admin instructions. Inject subcutaneously 40 units with breakfast, 30 units with lunch and 30 units with dinner    [provider]  Insulin Pen Needle 32G X 4 MM MISC 1 Device by Does not apply route QID. For use with insulin pens 02/13/22   [name], MD  insulin regular human CONCENTRATED (HUMULIN R U-500 KWIKPEN) 500 UNIT/ML KwikPen Inject 185 Units into the skin 3 (three) times daily with meals. Patient taking differently: Inject 195 Units into the skin 2 (two) times daily with a meal. Inject subcutaneously 195 units with breakfast and lunch 09/20/22 01/09/24  [name], MD  ivabradine (CORLANOR) 7.5 MG TABS tablet Take 1 tablet (7.5 mg total) by mouth 2 (two) times daily with a meal. Patient taking differently: Take 7.5 mg  by mouth at bedtime. 01/31/23   Milford, Anderson Malta, FNP  levocetirizine (XYZAL) 5 MG tablet Take 5 mg by mouth daily. 03/05/22   [provider]  levothyroxine (SYNTHROID) 137 MCG tablet Take 137 mcg by mouth daily. 04/12/23   [provider]  metoCLOPramide (REGLAN) 5 MG tablet Take 1 tablet (5 mg total) by mouth every 8 (eight) hours as needed for nausea or vomiting. 05/10/23 05/09/24  Rai, Delene Ruffini, MD  oxyCODONE (ROXICODONE) 5 MG immediate release tablet Take 1 tablet (5 mg total) by mouth every 6 (six) hours as needed for severe pain (pain score 7-10). 06/18/23   Tanda Rockers A, DO  pantoprazole (PROTONIX) 40 MG tablet Take 40 mg by mouth daily. 12/24/19   [provider]  polyethylene glycol (MIRALAX / GLYCOLAX) 17 g packet Take 17 g by mouth daily as needed for mild constipation. Also available OTC 05/10/23   Rai, Ripudeep K, MD  pregabalin (LYRICA) 300 MG capsule Take 300 mg by mouth 2 (two) times daily.    [provider]  prochlorperazine (COMPAZINE) 10 MG tablet Take 10 mg by mouth at bedtime.    [provider]  promethazine (PHENERGAN) 25 MG suppository Place 1 suppository (25 mg total) rectally every 6 (six) hours as needed for nausea or vomiting. 06/22/23   Marita Kansas, PA-C  promethazine (PHENERGAN) 25 MG tablet Take 1 tablet (25 mg total) by mouth every 6 (six) hours as needed for nausea or vomiting. 06/21/23   Lonell Grandchild, MD  rifaximin (XIFAXAN) 550 MG TABS tablet Take 1 tablet (550 mg total) by mouth 3 (three) times daily. 05/10/23   Rai, Delene Ruffini, MD  rosuvastatin (CRESTOR) 40 MG tablet Take 40 mg by mouth daily. 08/09/21   [provider]  Safety Seal Miscellaneous MISC Hormonic Hair Solution with minoxidil USP 7% and finasteride USP 0.05% - apply to affected areas daily every morning. 06/06/23   Terri Piedra, DO  sodium bicarbonate 650 MG tablet Take 2 tablets (1,300 mg total) by mouth 2 (two) times daily. 05/10/23   Rai,  Delene Ruffini, MD  sucralfate (CARAFATE) 1 g tablet Take 1 tablet (1 g total) by mouth with breakfast, with lunch, and with evening meal for 7 days. 06/18/23 06/25/23  Sloan Leiter, DO  SUMAtriptan (IMITREX) 25 MG tablet Take 25 mg by mouth every 2 (two) hours as needed. 01/30/23   [provider]  tamsulosin (FLOMAX) 0.4 MG CAPS capsule Take 1 capsule (0.4 mg total) by mouth daily. 05/10/23   Rai, Delene Ruffini, MD  topiramate (TOPAMAX) 50 MG tablet Take 50 mg by mouth 2 (two) times daily.    [provider]  torsemide (DEMADEX) 20 MG tablet Take 2 tablets (40 mg total) by mouth daily. May take 1 additional tablet as needed Patient taking differently: Take 20-40  mg by mouth See admin instructions. Take 40mg  daily. May take 1 additional tablet as needed for swelling. 10/05/22   [name], [name], MD  tretinoin (RETIN-A) 0.05 % cream Apply 1 application topically at bedtime. 01/17/21   [provider]      Allergies    Icosapent ethyl (epa ethyl ester) (fish), Ketamine, [place], Morphine, Penicillins, Clarithromycin, Erythromycin, Fentanyl, Penicillin v, Shellfish allergy, and Prednisone    Review of Systems   Review of Systems  Constitutional:  Negative for fever.  Respiratory:  Negative for shortness of breath.   Cardiovascular:  Negative for chest pain.  All other systems reviewed and are negative.   Physical Exam Updated Vital Signs BP (!) 174/78   Pulse 94   Temp 98 F (36.7 C) (Oral)   Resp 19   LMP 06/10/2023 (Exact Date)   SpO2 99%  Physical Exam Vitals and nursing note reviewed.  Constitutional:      Appearance: [name] well-developed.     Comments: Chronically ill-appearing but nontoxic  HENT:     Head: Normocephalic and atraumatic.  Eyes:     Pupils: Pupils are equal, round, and reactive to light.  Cardiovascular:     Rate and Rhythm: Normal rate and regular rhythm.     Heart sounds: Normal heart sounds.  Pulmonary:     Effort: Pulmonary effort  is normal. No respiratory distress.     Breath sounds: No wheezing.  Abdominal:     Palpations: Abdomen is soft.  Musculoskeletal:     Cervical back: Neck supple.  Skin:    General: Skin is warm and dry.  Neurological:     Mental Status: [name] alert and oriented to person, place, and time.  Psychiatric:        Mood and Affect: Mood normal.     ED Results / Procedures / Treatments   Labs (all labs ordered are listed, but only abnormal results are displayed) Labs Reviewed  CBG MONITORING, ED - Abnormal; Notable for the following components:      Result Value   Glucose-Capillary 191 (*)    All other components within normal limits    EKG EKG Interpretation Date/Time:  Saturday July 01 2023 23:02:00 EDT Ventricular Rate:  95 PR Interval:  120 QRS Duration:  82 QT Interval:  392 QTC Calculation: 492 R Axis:   35  Text Interpretation: Normal sinus rhythm Prolonged QT Abnormal ECG When compared with ECG of 30-Jun-2023 16:32, PREVIOUS ECG IS PRESENT Confirmed by Ross Marcus ([id number]) on 07/02/2023 12:59:20 AM  Radiology DG Chest Port 1 View Result Date: 07/02/2023 CLINICAL DATA:  Irregular blood pressure. EXAM: PORTABLE CHEST 1 VIEW COMPARISON:  June 30, 2023 FINDINGS: The heart size and mediastinal contours are within normal limits. A CardioMEMS device is noted. Both lungs are clear. The visualized skeletal structures are unremarkable. IMPRESSION: No acute cardiopulmonary disease. Electronically Signed   By: Aram Candela M.D.   On: 07/02/2023 00:26   DG Chest 2 View Result Date: 06/30/2023 CLINICAL DATA:  Chest pain EXAM: CHEST - 2 VIEW COMPARISON:  X-ray 06/28/2023 FINDINGS: No consolidation, pneumothorax or effusion. Normal cardiopericardial silhouette without edema. CardioMEMS device noted. Overlapping gown snaps. Slight levoconvex curvature of the thoracic spine could be positional. IMPRESSION: No acute cardiopulmonary disease. Electronically Signed   By: Karen Kays  M.D.   On: 06/30/2023 17:15    Procedures Procedures    Medications Ordered in ED Medications  hydrALAZINE (APRESOLINE) injection 10 mg (10 mg Intravenous Given  07/02/23 0209)  acetaminophen (TYLENOL) tablet 1,000 mg (1,000 mg Oral Given 07/02/23 0229)    ED Course/ Medical Decision Making/ A&P                                 Medical Decision Making Risk OTC drugs. Prescription drug management.   This patient presents to the ED for concern of elevated blood pressure, this involves an extensive number of treatment options, and is a complaint that carries with it a high risk of complications and morbidity.  I considered the following differential and admission for this acute, potentially life threatening condition.  The differential diagnosis includes essential hypertension, hypertensive urgency, hypertensive emergency  MDM:    This is a 33 year old female with multiple chronic medical problems who presents with concerns for high blood pressure.  At baseline her blood pressure is quite high based on chart review.  She states that he she has taken a significant amount of medication.  She last took a dose of hydralazine of 150 milligrams and 6.25 of carvedilol at 6 and 8 PM respectively.  On my initial evaluation, heart rate is in the 90s and blood pressure is 191/95.  She denies any physical complaints.  EKG is reassuring.  CBG 191.  She has had a full chest pain workup in the last 4 to 48 hours that has been reassuring.  Question whether she truly took this much medication especially given her heart rate; however will monitor closely.  [name] not out of range of her normal.  The relative half-life of hydralazine is quite short.  Patient never had hypotension.  I actually gave her an additional dose of hydralazine because she was worried about it.  Blood pressure 174/78.  I do not feel she needs further workup at this time.  I did discuss with her that she needs to take her blood pressure  medications as prescribed and follow-up closely with her primary doctor for modifications.  She should keep a blood pressure log.  (Labs, imaging, consults)  Labs: I Ordered, and personally interpreted labs.  The pertinent results include: CBG  Imaging Studies ordered: I ordered imaging studies including chest x-ray I independently visualized and interpreted imaging. I agree with the radiologist interpretation  Additional history obtained from chart review.  External records from outside source obtained and reviewed including recent workups  Cardiac Monitoring: The patient was maintained on a cardiac monitor.  If on the cardiac monitor, I personally viewed and interpreted the cardiac monitored which showed an underlying rhythm of: Sinus  Reevaluation: After the interventions noted above, I reevaluated the patient and found that they have :stayed the same  Social Determinants of Health:  lives independently  Disposition: Discharge  Co morbidities that complicate the patient evaluation  Past Medical History:  Diagnosis Date   Afib (HCC) 05/12/2021   Brain tumor (HCC) 03/29/1995   astrocytoma   CHF (congestive heart failure) (HCC)    Cholesterosis    CKD (chronic kidney disease) stage 4, GFR 15-29 ml/min (HCC) 05/13/2021   DM (diabetes mellitus) (HCC) 10/10/2018   Fatty liver    HTN (hypertension) 10/10/2018   Hypothyroidism 10/10/2018   Lipoprotein deficiency    Lung disease    longevity long term   Pancreatitis    Polycystic ovary syndrome      Medicines Meds ordered this encounter  Medications   hydrALAZINE (APRESOLINE) injection 10 mg   acetaminophen (TYLENOL) tablet  1,000 mg    I have reviewed the patients home medicines and have made adjustments as needed  Problem List / ED Course: Problem List Items Addressed This Visit       Cardiovascular and Mediastinum   HTN (hypertension) - Primary                Final Clinical Impression(s) / ED  Diagnoses Final diagnoses:  Primary hypertension    Rx / DC Orders ED Discharge Orders     None         Shon Baton, MD 07/02/23 0405

## 2023-07-07 ENCOUNTER — Telehealth: Payer: Self-pay

## 2023-07-07 NOTE — Telephone Encounter (Signed)
 Patient called and left a message upset that she has still not been scheduled for NCV. Where did you want that done? I'll fax on Monday

## 2023-07-07 NOTE — Progress Notes (Signed)
 Ordered to rule out pregnancy

## 2023-07-09 DIAGNOSIS — G4736 Sleep related hypoventilation in conditions classified elsewhere: Secondary | ICD-10-CM | POA: Diagnosis not present

## 2023-07-09 DIAGNOSIS — R0681 Apnea, not elsewhere classified: Secondary | ICD-10-CM | POA: Diagnosis not present

## 2023-07-09 DIAGNOSIS — G4730 Sleep apnea, unspecified: Secondary | ICD-10-CM

## 2023-07-09 NOTE — Procedures (Signed)
 [facility] [address] [place], [place] [address] Tel: [phone]   Fax: [phone]  Polysomnography Interpretation  Patient Name:  CHRISHAUNA, MEE Study Date:  06/27/2023 Referring Physician:  Antonio Baumgarten, Np  Indications for Polysomnography The patient is a 33 year old Female who is 4\' 9"  and weighs 144.0 lbs. Her BMI equals 31.5.  A full night polysomnogram was performed to evaluate for -.OSA  Medication  None taken.   Polysomnogram Data A full night polysomnogram recorded the standard physiologic parameters including EEG, EOG, EMG, EKG, nasal and oral airflow.  Respiratory parameters of chest and abdominal movements were recorded with Respiratory Inductance Plethysmography belts.  Oxygen saturation was recorded by pulse oximetry.   Sleep Architecture The total recording time of the polysomnogram was 389.2 minutes.  The total sleep time was 336.5 minutes.  The patient spent 4.5% of total sleep time in Stage N1, 88.6% in Stage N2, 0.0% in Stages N3, and 7.0% in REM.  Sleep latency was 4.9 minutes.  REM latency was 360.0 minutes.  Sleep Efficiency was 86.5%.  Wake after Sleep Onset time was 47.5 minutes.  Respiratory Events The polysomnogram revealed a presence of - obstructive, - central, and - mixed apneas resulting in an Apnea index of - events per hour.  There were 245 hypopneas (>=3% desaturation and/or arousal) resulting in an Apnea\Hypopnea Index (AHI >=3% desaturation and/or arousal) of 43.7 events per hour.  There were 159 hypopneas (>=4% desaturation) resulting in an Apnea\Hypopnea Index (AHI >=4% desaturation) of 28.4 events per hour.  There were 3 Respiratory Effort Related Arousals resulting in a RERA index of 0.5 events per hour. The Respiratory Disturbance Index is 44.2 events per hour.  The snore index was 225.4 events per hour.  Mean oxygen saturation was 91.7%.  The lowest oxygen saturation during sleep was 75.0%.   Time spent <=88% oxygen saturation was 27.6 minutes (7.1%).  End Tidal CO2 during sleep ranged from - to - mmHg. End Tidal CO2 was greater than 50 mmHg for - minutes and greater than 55 mmHg for - minutes.  Limb Activity There were 8 total limb movements recorded, of this total, 8 were classified as PLMs.  PLM index was 1.4 per hour and PLM associated with Arousals index was - per hour.  Cardiac Summary The average pulse rate was 94.4 bpm.  The minimum pulse rate was 83.0 bpm while the maximum pulse rate was 109.0 bpm.  Cardiac rhythm was abnormal (sinus tachycardia)l.  Comment: Moderate obstructive sleep apnea, AHI (4%) 28.4/hr. Snoring with oxygen desaturation to a nadir of 75% and mean 91.7%.  Diagnosis: Obstructive sleep apnea  Recommendations: Suggest CPAP titration sleep study or autopap. Other options would be based on clinical judgment.    This study was personally reviewed and electronically signed by: Dr. Tereso Fenton. [employer] Academic librarian in Sleep Medicine Date/Time: 07/09/23   1:27        Diagnostic PSG Report  Patient Name: BREASIA, KARGES Study Date: 06/27/2023  Date of Birth: 27-Jul-1990 Study Type: Diagnostic  Age: 60 year MRN #: [id number]  Sex: Female Interpreting Physician: [employer] [id number]  Height: 4\' 9"  Referring Physician: Antonio Baumgarten, Np  Weight: 144.0 lbs Recording Tech: Charlsie Cool CRT RPSGT RST  BMI: 31.5 Scoring Tech: Charlsie Cool CRT RPSGT RST  ESS: 9 Neck Size: 13.5   Study Overview  Lights Off: 10:54:03 PM  Count Index  Lights On: 05:23:17 AM Awakenings: 25 4.5  Time  in Bed: 389.2 min. Arousals: 18 3.2  Total Sleep Time: 336.5 min. AHI (>=3% Desat and/or Ar.): 245 43.7   Sleep Efficiency: 86.5% AHI (>=4% Desat): 159 28.4   Sleep Latency: 4.9 min. Limb Movements: 8 1.4  Wake After Sleep Onset: 47.5 min. Snore: 1264 225.4  REM Latency from Sleep Onset: 360.0 min. Desaturations: 287 51.2     Minimum SpO2 TST:  75.0%    Sleep Architecture  % of Time in Bed Stages Time (mins) % Sleep Time  Wake 52.5   Stage N1 15.0 4.5%  Stage N2 298.0 88.6%  Stage N3 0.0 0.0%  REM 23.5 7.0%   Arousal Summary   NREM REM Sleep Index  Respiratory Arousals 3 1 4  0.7  PLM Arousals - - - -  Isolated Limb Movement Arousals - - - -  Snore Arousals 4 - 4 0.7  Spontaneous Arousals 10 - 10 1.8  Total 17 1 18  3.2   Limb Movement Summary   Count Index  Isolated Limb Movements - -  Periodic Limb Movements (PLMs) 8 1.4  Total Limb Movements 8 1.4    Respiratory Summary   By Sleep Stage By Body Position Total   NREM REM Supine Non-Supine   Time (min) 313.0 23.5 - 336.5 336.5         Obstructive Apnea - - - - -  Mixed Apnea - - - - -  Central Apnea - - - - -  Total Apneas - - - - -  Total Apnea Index - - - - -         Hypopneas (>=3% Desat and/or Ar.) 223 22 - 245 245  AHI (>=3% Desat and/or Ar.) 42.7 56.2 - 43.7 43.7         Hypopneas (>=4% Desat) 138 21 - 159 159  AHI (>=4% Desat) 26.5 53.6 - 28.4 28.4          RERAs 3 - - 3 3  RERA Index 0.6 - - 0.5 0.5         RDI 43.3 56.2 - 44.2 44.2    Respiratory Event Type Index  Central Apneas -  Obstructive Apneas -  Mixed Apneas -  Central Hypopneas -  Obstructive Hypopneas -  Central Apnea + Hypopnea (CAHI) -  Obstructive Apnea + Hypopnea (OAHI) 43.9   Respiratory Event Durations   Apnea Hypopnea   NREM REM NREM REM  Average (seconds) - - 16.9 23.9  Maximum (seconds) - - 29.1 42.2    Oxygen Saturation Summary   Wake NREM REM TST TIB  Average SpO2 (%) 93.0% 91.9% 85.9% 91.5% 91.7%  Minimum SpO2 (%) 78.0% 80.0% 75.0% 75.0% 75.0%  Maximum SpO2 (%) 96.0% 97.0% 95.0% 97.0% 97.0%   Oxygen Saturation Distribution  Range (%) Time in range (min) Time in range (%)  90.0 - 100.0 325.4 83.6%  80.0 - 90.0 61.3 15.7%  70.0 - 80.0 2.5 0.7%  60.0 - 70.0 - -  50.0 - 60.0 - -  0.0 - 50.0 - -  Time Spent <=88% SpO2  Range (%) Time in range  (min) Time in range (%)  0.0 - 88.0 27.6 7.1%      Count Index  Desaturations 287 51.2    Cardiac Summary   Wake NREM REM Sleep Total  Average Pulse Rate (BPM) 100.3 93.1 98.1 93.4 94.4  Minimum Pulse Rate (BPM) 86.0 83.0 90.0 83.0 83.0  Maximum Pulse Rate (BPM) 108.0 104.0 109.0 109.0 109.0   Pulse  Rate Distribution:  Range (bpm) Time in range (min) Time in range (%)  0.0 - 40.0 - -  40.0 - 60.0 - -  60.0 - 80.0 - -  80.0 - 100.0 333.6 85.7%  100.0 - 120.0 55.2 14.2%  120.0 - 140.0 - -  140.0 - 200.0 - -   EtCO2 Summary  Stage Min (mmHg) Average (mmHg) Max (mmHg)  Wake - - -  NREM(1+2+3) - - -  REM - - -   EtCO2 Distribution:  Range (mmHg) Time in range (min) Time in range (%)  20.0 - 40.0 - -  40.0 - 50.0 - -  50.0 - 100.0 - -  55.0 - 100.0 - -  Excluded data <20.0 & >65.0 389.5 100.0%     Hypnograms                         Technologist Comments  THE 80-YEAR-OLD FEMALE PATIENT PRESENTED TO THE SLEEP DISORDER CENTER FOR A NPSG DIAGNOSTIC STUDY WITH CHIEF COMPLAINTS OF WITNESSED EPISODE OF APNEA AND NOCTURNAL HYPOXEMIA. NO BEDTIME MEDICATIONS WERE SELF ADMINISTERED. THE LEAD PLACEMENT WAS INITIATED, THEN THE STUDY WAS BEGUN. SUPPLEMENTAL OXYGEN WAS NOT WARRANTED DURING THE STUDY. MILD SNORING WAS NOTED THROUGHOUT THE STUDY. RARE PLMs - PLMAs WERE NOTED. SOME SINUS TACHYCARDIA WAS NOTED, BUT NO OBVIOUS CARDIAC ARRHYTHMIAS WERE OBSERVED. ONE RESTROOM VISIT WAS MADE. NO OBVIOUS PARASOMNIAS WERE OBSERVED, BUT THE PATIENT WAS NOTED MOANING. THE PATIENT TOLERATED THE NPSG DIAGNOSTIC STUDY WELL. -Tech                          [employer], Biomedical engineer of Sleep Medicine  ELECTRONICALLY SIGNED ON:  07/09/2023, 1:22 PM [facility] PH: [phone]   FX: [phone] ACCREDITED BY THE [employer]ICAN ACAD[employer]Y OF SLEEP MEDICINE

## 2023-07-10 ENCOUNTER — Encounter (HOSPITAL_COMMUNITY): Payer: Self-pay

## 2023-07-10 ENCOUNTER — Ambulatory Visit (HOSPITAL_COMMUNITY)
Admission: RE | Admit: 2023-07-10 | Discharge: 2023-07-10 | Disposition: A | Discharge status: Home / Self Care | Source: Ambulatory Visit | Attending: Cardiology | Admitting: Cardiology

## 2023-07-10 VITALS — BP 164/84 | HR 108 | Ht <= 58 in | Wt 142.8 lb

## 2023-07-10 DIAGNOSIS — E875 Hyperkalemia: Secondary | ICD-10-CM | POA: Insufficient documentation

## 2023-07-10 DIAGNOSIS — I428 Other cardiomyopathies: Secondary | ICD-10-CM | POA: Diagnosis not present

## 2023-07-10 DIAGNOSIS — G8194 Hemiplegia, unspecified affecting left nondominant side: Secondary | ICD-10-CM | POA: Insufficient documentation

## 2023-07-10 DIAGNOSIS — E1322 Other specified diabetes mellitus with diabetic chronic kidney disease: Secondary | ICD-10-CM | POA: Insufficient documentation

## 2023-07-10 DIAGNOSIS — R000 Tachycardia, unspecified: Secondary | ICD-10-CM | POA: Diagnosis not present

## 2023-07-10 DIAGNOSIS — Z91148 Patient's other noncompliance with medication regimen for other reason: Secondary | ICD-10-CM | POA: Diagnosis not present

## 2023-07-10 DIAGNOSIS — Z85841 Personal history of malignant neoplasm of brain: Secondary | ICD-10-CM | POA: Diagnosis not present

## 2023-07-10 DIAGNOSIS — E781 Pure hyperglyceridemia: Secondary | ICD-10-CM | POA: Diagnosis not present

## 2023-07-10 DIAGNOSIS — K760 Fatty (change of) liver, not elsewhere classified: Secondary | ICD-10-CM | POA: Insufficient documentation

## 2023-07-10 DIAGNOSIS — K580 Irritable bowel syndrome with diarrhea: Secondary | ICD-10-CM | POA: Insufficient documentation

## 2023-07-10 DIAGNOSIS — Z79899 Other long term (current) drug therapy: Secondary | ICD-10-CM | POA: Diagnosis not present

## 2023-07-10 DIAGNOSIS — R0681 Apnea, not elsewhere classified: Secondary | ICD-10-CM | POA: Insufficient documentation

## 2023-07-10 DIAGNOSIS — I5022 Chronic systolic (congestive) heart failure: Secondary | ICD-10-CM | POA: Insufficient documentation

## 2023-07-10 DIAGNOSIS — I130 Hypertensive heart and chronic kidney disease with heart failure and stage 1 through stage 4 chronic kidney disease, or unspecified chronic kidney disease: Secondary | ICD-10-CM | POA: Diagnosis not present

## 2023-07-10 DIAGNOSIS — I2510 Atherosclerotic heart disease of native coronary artery without angina pectoris: Secondary | ICD-10-CM | POA: Diagnosis not present

## 2023-07-10 DIAGNOSIS — I272 Pulmonary hypertension, unspecified: Secondary | ICD-10-CM | POA: Insufficient documentation

## 2023-07-10 DIAGNOSIS — N184 Chronic kidney disease, stage 4 (severe): Secondary | ICD-10-CM | POA: Insufficient documentation

## 2023-07-10 DIAGNOSIS — I071 Rheumatic tricuspid insufficiency: Secondary | ICD-10-CM | POA: Insufficient documentation

## 2023-07-10 DIAGNOSIS — K861 Other chronic pancreatitis: Secondary | ICD-10-CM | POA: Insufficient documentation

## 2023-07-10 DIAGNOSIS — Z794 Long term (current) use of insulin: Secondary | ICD-10-CM | POA: Diagnosis not present

## 2023-07-10 LAB — BASIC METABOLIC PANEL WITH GFR
Anion gap: 13 (ref 5–15)
BUN: 38 mg/dL — ABNORMAL HIGH (ref 6–20)
CO2: 23 mmol/L (ref 22–32)
Calcium: 9.7 mg/dL (ref 8.9–10.3)
Chloride: 100 mmol/L (ref 98–111)
Creatinine, Ser: 1.85 mg/dL — ABNORMAL HIGH (ref 0.44–1.00)
GFR, Estimated: 37 mL/min — ABNORMAL LOW (ref >60)
Glucose, Bld: 262 mg/dL — ABNORMAL HIGH (ref 70–99)
Potassium: 4 mmol/L (ref 3.5–5.1)
Sodium: 136 mmol/L (ref 135–145)

## 2023-07-10 LAB — BRAIN NATRIURETIC PEPTIDE: B Natriuretic Peptide: 102.7 pg/mL — ABNORMAL HIGH (ref 0.0–100.0)

## 2023-07-10 MED ORDER — METOLAZONE 2.5 MG PO TABS: 2.5000 mg | ORAL_TABLET | Freq: Once | ORAL | 0 refills | Status: DC

## 2023-07-10 MED ORDER — AMLODIPINE BESYLATE 5 MG PO TABS: 5.0000 mg | ORAL_TABLET | Freq: Every day | ORAL | 3 refills | Status: DC

## 2023-07-10 MED ORDER — TORSEMIDE 20 MG PO TABS: 60.0000 mg | ORAL_TABLET | Freq: Every day | ORAL | 8 refills | Status: DC

## 2023-07-10 MED ORDER — HYDRALAZINE HCL 50 MG PO TABS: 75.0000 mg | ORAL_TABLET | Freq: Three times a day (TID) | ORAL | 8 refills | Status: DC

## 2023-07-10 MED ORDER — POTASSIUM CHLORIDE CRYS ER 20 MEQ PO TBCR: 20.0000 meq | EXTENDED_RELEASE_TABLET | Freq: Every day | ORAL | 0 refills | Status: DC

## 2023-07-10 MED ORDER — AMLODIPINE BESYLATE 5 MG PO TABS: 2.5000 mg | ORAL_TABLET | Freq: Every day | ORAL | 0 refills | Status: DC

## 2023-07-10 NOTE — Progress Notes (Signed)
 Advanced [facility] Failure Clinic Note   PCP: Leonard Downing HF Cardiologist: Dr. 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   Has been followed by Dr. Jacinto Halim for her HF.    Admitted 11/22/20 with ab pain and diarrhea. Ab CT concerning for vasculitis (no pancreatitis). Her TG were found to be 4030. She was started on an insulin/dextrose infusion. Echo 11/28/20 EF 20-25% with moderate RV dysfunction and mild septal flattening. Started on DBA 2.5. cMRI c/w prior myocarditis vs sarcoid (see below). Mod pericardial effusion also noted. LVEF 29%. RV normal. She underwent R/LHC showing single vessel RCA occlusion, RA 1, LVEDP 17, Fick CO 5.6/CI 3.88. She was able to be weaned off inotropes and GDMT titrated.     Post hospital follow up, she was drinking >2L/day fluid and eating more salty foods, volume was up, ReDs 43%, She followed up with Dr. Houston Siren with [facility] 12/17/20 for a 2nd opinion. He placed LifeVest and switched beta blocker to Toprol XL.   Echo (10/22): EF up to 45-50%, grade II DD, RV ok. LifeVest off.   Treated in ED 10/22 for pancreatitis, then admitted 11/22 for hyperglycemia, did not meet criteria for DKA. Hospitalization c/b hyponatremia, sodium 118. Concern for volume overload. Lasix restarted but SCr increased. Nephrology consulted and restarted gentle IVF and GDMT held for a couple of days.    Echo 12/22 EF 45%, RV normal.    Follow up 08/02/21, weight up 16 lbs, ReDs 27%. Arranged for RHC to better assess hemodynamics and see if abdominal symptoms related to right-sided HF due to high-output state or non-cardiac (see results below).    RHC 5/23 with elevated filling pressures and  normal cardiac ouput. Mild to moderate mixed pulmonary hypertension with prominent v-waves in RA, suggestive of significant TR. Given IV lasix and metolazone 2.5 added weekly to diuretic regimen.    RA = 12 prominent V waves, RV = 47/14, PA = 48/25 (37), PCW = 25, Fick cardiac output/index = 4.4/2.8, PVR =3.7 WU, Ao sat = 99%, PA sat = 62%, 62%   Post cath follow up, volume up, REDs 43%. Torsemide increased and weekly metolazone added.   Admitted 5/23 with AKI and hyperkalemia. Given IVF, insulin, calcium gluconate and Lokelma. Echo showed EF 45-50%, RV normal, mild-mod MR, RVSP 30. Only mild TR. Restarted on torsemide 40 bid (lower dose) and Entresto 49/51 bid. Arranged paramedicine, discharged home 143 lbs.   Seen 09/27/21 at [facility] for 2nd opinion, felt to be congested and recommended increasing torsemide to 60 mg bid and instructed f/u in 3 months.   Received Furoscix 8/4, 8/5, and 10/31/21.  Admitted 9/23 for volume overload in setting of RV failure and AKI. Required milrinone. Underwent Cardiomems implant  RHC 9/23  RA 15, PA 37/23 (28), PCWP 16, Fick CO/CI 4.2/2.6, Thermo CO/CI 3.7/2.3, PVR 4.3, PA sat 51%, 54%, PAPi 0.93  Previously being followed by Dr. Lindie Spruce at [facility]. Had renal biopsy recently (12/31/21) and showed DM and HTN nephropathy. They discussed starting HD soon. Now being followed by [facility].   Echo 10/04/22 EF 55% RV ok. Mild AI   Continues to have multiple hospitalizations for hyperglycemia, hyperTG (>5,000) and uncontrolled HTN. It is suspected she has  OSA. Observed apnea noted previous hospitalizations. She has been referred to Pulmonology and they have recommended sleep study.   She presents today for HF f/u. She complains of increased dyspnea, NYHA Class IIIb. Not c/w sending daily CardioMEMs readings but she did submit transmission today CardioMEMs transmission today. Diastolic PA reading is elevated at 32 today (goal is 17). She reports full med compliance and supar  response to 40 mg of torsemide daily.  BP also elevated 164/84. She tells me she has already had sleep study completed and says report said study was "normal", but I do not see in chart.     ROS: All systems reviewed and negative except as per HPI.   Past Medical History:  Diagnosis Date   Afib (HCC) 05/12/2021   Brain tumor (HCC) 03/29/1995   astrocytoma   CHF (congestive heart failure) (HCC)    Cholesterosis    CKD (chronic kidney disease) stage 4, GFR 15-29 ml/min (HCC) 05/13/2021   DM (diabetes mellitus) (HCC) 10/10/2018   Fatty liver    HTN (hypertension) 10/10/2018   Hypothyroidism 10/10/2018   Lipoprotein deficiency    Lung disease    longevity long term   Pancreatitis    Polycystic ovary syndrome    Current Outpatient Medications  Medication Sig Dispense Refill   amitriptyline (ELAVIL) 10 MG tablet Take 10 mg by mouth at bedtime.     aspirin EC 81 MG EC tablet Take 1 tablet (81 mg total) by mouth daily. Swallow whole. 30 tablet 1   azelastine (ASTELIN) 0.1 % nasal spray Place 2 sprays into both nostrils daily at 6 (six) AM.     BLISOVI FE 1.5/30 1.5-30 MG-MCG tablet Take 1 tablet by mouth daily.     butalbital-acetaminophen-caffeine (FIORICET) 50-325-40 MG tablet Take 1 tablet by mouth daily as needed for headache.     carvedilol (COREG) 6.25 MG tablet Take 1 tablet (6.25 mg total) by mouth 2 (two) times daily with a meal. 60 tablet 2   CRANBERRY CONCENTRATE PO Take 2 tablets by mouth 2 (two) times daily.     CREON 36000-114000 units CPEP capsule Take 1 capsule (36,000 Units total) by mouth 3 (three) times daily. 90 capsule 3   diclofenac (VOLTAREN) 75 MG EC tablet Take 1 tablet (75 mg total) by mouth 2 (two) times daily. 30 tablet 0   doxycycline (VIBRA-TABS) 100 MG tablet Take 1 tablet (100 mg total) by mouth daily. Take with food. 30 tablet 2   DULoxetine (CYMBALTA) 60 MG capsule Take 60 mg by mouth at bedtime.     ergocalciferol (VITAMIN D2) 1.25 MG (50000 UT) capsule  Take 50,000 Units by mouth every 7 (seven) days. Every Sunday     Evolocumab (REPATHA) 140 MG/ML SOSY Inject 140 mg into the skin every 14 (fourteen) days. Saturdays     fenofibrate 54 MG tablet Take 1 tablet (54 mg total) by mouth daily. (Patient taking differently: Take 54 mg by mouth at bedtime.) 30 tablet 11   hydrALAZINE (APRESOLINE) 50 MG tablet Take 1 tablet (50 mg total) by mouth 3 (three) times daily. 90 tablet 3   insulin lispro (HUMALOG) 100 UNIT/ML injection Inject 30-40 Units into the skin See admin instructions. Inject subcutaneously 40 units with breakfast, 30 units with lunch and 30 units with dinner     Insulin Pen Needle 32G X 4 MM MISC 1 Device by Does not apply route QID. For use with insulin pens 100 each 0   insulin regular human CONCENTRATED (HUMULIN  R U-500 KWIKPEN) 500 UNIT/ML KwikPen Inject 185 Units into the skin 3 (three) times daily with meals. (Patient taking differently: Inject 195 Units into the skin 2 (two) times daily with a meal. Inject subcutaneously 195 units with breakfast and lunch) 33.3 mL 0   ivabradine (CORLANOR) 7.5 MG TABS tablet Take 1 tablet (7.5 mg total) by mouth 2 (two) times daily with a meal. (Patient taking differently: Take 7.5 mg by mouth at bedtime.) 60 tablet 6   levocetirizine (XYZAL) 5 MG tablet Take 5 mg by mouth daily.     levothyroxine (SYNTHROID) 137 MCG tablet Take 137 mcg by mouth daily.     metoCLOPramide (REGLAN) 5 MG tablet Take 1 tablet (5 mg total) by mouth every 8 (eight) hours as needed for nausea or vomiting. 90 tablet 0   oxyCODONE (ROXICODONE) 5 MG immediate release tablet Take 1 tablet (5 mg total) by mouth every 6 (six) hours as needed for severe pain (pain score 7-10). 5 tablet 0   pantoprazole (PROTONIX) 40 MG tablet Take 40 mg by mouth daily.     polyethylene glycol (MIRALAX / GLYCOLAX) 17 g packet Take 17 g by mouth daily as needed for mild constipation. Also available OTC 30 each 0   pregabalin (LYRICA) 300 MG capsule Take  300 mg by mouth 2 (two) times daily.     prochlorperazine (COMPAZINE) 10 MG tablet Take 10 mg by mouth at bedtime.     promethazine (PHENERGAN) 25 MG suppository Place 1 suppository (25 mg total) rectally every 6 (six) hours as needed for nausea or vomiting. 12 each 0   promethazine (PHENERGAN) 25 MG tablet Take 1 tablet (25 mg total) by mouth every 6 (six) hours as needed for nausea or vomiting. 20 tablet 0   rifaximin (XIFAXAN) 550 MG TABS tablet Take 1 tablet (550 mg total) by mouth 3 (three) times daily. 90 tablet 1   rosuvastatin (CRESTOR) 40 MG tablet Take 40 mg by mouth daily.     Safety Seal Miscellaneous MISC Hormonic Hair Solution with minoxidil USP 7% and finasteride USP 0.05% - apply to affected areas daily every morning. 30 mL 2   sodium bicarbonate 650 MG tablet Take 2 tablets (1,300 mg total) by mouth 2 (two) times daily. 120 tablet 3   sucralfate (CARAFATE) 1 g tablet Take 1 tablet (1 g total) by mouth with breakfast, with lunch, and with evening meal for 7 days. 21 tablet 0   SUMAtriptan (IMITREX) 25 MG tablet Take 25 mg by mouth every 2 (two) hours as needed.     tamsulosin (FLOMAX) 0.4 MG CAPS capsule Take 1 capsule (0.4 mg total) by mouth daily. 30 capsule 0   topiramate (TOPAMAX) 50 MG tablet Take 50 mg by mouth 2 (two) times daily.     torsemide (DEMADEX) 20 MG tablet Take 2 tablets (40 mg total) by mouth daily. May take 1 additional tablet as needed (Patient taking differently: Take 20-40 mg by mouth See admin instructions. Take 40mg  daily. May take 1 additional tablet as needed for swelling.) 90 tablet 2   tretinoin (RETIN-A) 0.05 % cream Apply 1 application topically at bedtime.     No current facility-administered medications for this visit.   Allergies  Allergen Reactions   Icosapent Ethyl (Epa Ethyl Ester) (Fish)     Hives w/Vascepa   Ketamine Other (See Comments)    In vegetative state for 15 minutes per pt   [place] Itching    Itchy throat  Itchy throat  Itchy  throat Itchy throat Itchy throat    Itchy throat  Itchy throat Itchy throat Itchy throat Itchy throat    Itchy throat   Morphine Itching, Rash and Hives    Sorethroat   Penicillins Hives, Itching and Rash    Has patient had a PCN reaction causing immediate rash, facial/tongue/throat swelling, SOB or lightheadedness with hypotension: Y Has patient had a PCN reaction causing severe rash involving mucus membranes or skin necrosis: Y Has patient had a PCN reaction that required hospitalization: N Has patient had a PCN reaction occurring within the last 10 years: Y   Clarithromycin Other (See Comments)    Stomach pain   Erythromycin     Stomach pain   Fentanyl Nausea And Vomiting   Penicillin V Hives, Itching and Other (See Comments)    Gi upset, also   Shellfish Allergy Other (See Comments)    Pt has never had shellfish but tested positive on allergy test. Pt states contrast in CT is okay   Prednisone Rash   Social History   Socioeconomic History   Marital status: Divorced    Spouse name: Not on file   Number of children: 0   Years of education: Not on file   Highest education level: Not on file  Occupational History   Occupation: [employer]  Tobacco Use   Smoking status: Never   Smokeless tobacco: Never  Vaping Use   Vaping status: Never Used  Substance and Sexual Activity   Alcohol use: Never   Drug use: Never   Sexual activity: Yes  Other Topics Concern   Not on file  Social History Narrative   Not on file   Social Drivers of Health   Financial Resource Strain: Low Risk  (05/31/2023)   Received from [facility]   Overall Financial Resource Strain (CARDIA)    Difficulty of Paying Living Expenses: Not hard at all  Food Insecurity: No Food Insecurity (05/31/2023)   Received from [facility]   Hunger Vital Sign    Worried About Running Out of Food in the Last Year: Never true    Ran Out of Food in the Last Year: Never true  Transportation Needs: No  Transportation Needs (05/31/2023)   Received from [facility] - Transportation    Lack of Transportation (Medical): No    Lack of Transportation (Non-Medical): No  Physical Activity: Unknown (05/31/2023)   Received from [facility]   Exercise Vital Sign    Days of Exercise per Week: Patient declined    Minutes of Exercise per Session: 0 min  Stress: No Stress Concern Present (05/31/2023)   Received from [facility] - Occupational Stress Questionnaire    Feeling of Stress : Not at all  Social Connections: Socially Integrated (05/31/2023)   Received from [facility]   Social Network    How would you rate your social network (family, work, friends)?: Good participation with social networks  Intimate Partner Violence: Patient Declined (05/31/2023)   Received from [facility]   HITS    Over the last 12 months how often did your partner physically hurt you?: Patient declined    Over the last 12 months how often did your partner insult you or talk down to you?: Patient declined    Over the last 12 months how often did your partner threaten you with physical harm?: Patient declined    Over the last 12 months how often did  your partner scream or curse at you?: Patient declined   Family History  Problem Relation Age of Onset   Diabetes Mother    Hypertension Mother    Hyperlipidemia Mother    Thyroid disease Mother    Hypertension Father    Diabetes Father    Pancreatic cancer Paternal Aunt    Pancreatic cancer Paternal Uncle    Colon cancer Neg Hx    Esophageal cancer Neg Hx    Stomach cancer Neg Hx    LMP 06/10/2023 (Exact Date)   Wt Readings from Last 3 Encounters:  07/02/23 65.3 kg (144 lb)  06/30/23 65 kg (143 lb 4.8 oz)  06/27/23 65.3 kg (144 lb)    PHYSICAL EXAM: General:  chronically ill appearing No respiratory difficulty HEENT: left eye ptosis  Neck: supple. JVD not well visualized. Carotids 2+ bilat; no bruits.  No lymphadenopathy or thyromegaly appreciated. Cor: PMI nondisplaced. Regular rate & rhythm. No rubs, gallops or murmurs. Lungs: clear Abdomen: nontender, ++distended.  Extremities: no cyanosis, clubbing, rash, edema Neuro: alert & oriented x 3, cranial nerves grossly intact. moves all 4 extremities w/o difficulty. Affect pleasant.  Cardiomems (personally reviewed): today's diastolic PAP reading is elevated at 32 (Goal 17)   ECG (personally reviewed): not performed  ASSESSMENT & PLAN: Nonischemic cardiomyopathy, recovered EF, previously with HFrEF (EF 29%) - Echo (5/23): EF 45-50%, RV normal  - Echo 9/23: EF 50-55%, RV normal. mild MR, mild-mod TR - Echo 3/24 EF 45-50% mild-mod TR, moderate MR - Echo (06/10/22) EF 30-35% (lower than past), RV mildly dilated, mod-sev MR, sev TR. - Echo 10/04/22 EF 55% RV ok. Mild AI  - NYHA Class IIIb. Abdomen distended. DPAP on today's CardioMEMs reading high and well above goal. 32 today (goal 17). She reports full med compliance but questionable given h/o noncompliance. BP also elevated  - Increase torsemide to 60 mg daily + give 1 x dose of metolazone 2.5 mg x 1 w/ 20 mEq KCl x 1  - Off Spiro due to hyperkalemia  - Off Jardiance due to frequent GU infections  - Off ARB/ARNI with CKD and previous issues w/ AKIs - Increase hydralazine to 75 mg tid - Continue carvedilol 6.52 mg bid (low BP and dizzy at higher doses) will also not increase today due to volume overload  - Continue ivabradine 7.5 mg bid.  - Check BMP today  - I asked her to use her daily  Cardiomems to help better managed volume remotely and reduce office/ER visits and hospitalizations    2. CAD - Single vessel RCA occlusion (small co-dominant vessel) on cardiac cath 09/08. - denies CP - medical management   3.  CKD IV - Frequent AKIs - Scr baseline variable with multiple AKIs, typically 2.0-2.5 - Previously followed by Dr. Mammie Sears ([facility]phrology at [facility]). Had renal biopsy (12/31/21) and  showed DM and HTN nephropathy.  - Now followed by [facility] - Check BMP today    4. Pulmonary hypertension - Mild to moderate on RHC 5/23. PVR 3.7 WU. - RHC 12/06/21: RA 15, PA 37/23 (28), PCWP 16, Fick CO/CI 4.2/2.6, Thermo CO/CI 3.7/2.3, PVR 4.3, PA sat 51%, 54%, PAPi 0.93  - VQ negative - She stopped sildenafil with dizziness and low BP   5. TR - Prominent v-waves in RA tracing on RHC 5/23. - Echo 5/23 shows only mild TR  - Mild to mod on 9/23 echo - moderate on echo (3/24) - TR on mild on echo 10/04/22   6. Severe  hyperTG - Due to LPL deficiency w/ recent pancreatitis. - Continue fenofibrate  - Recent admission >5000 - CTA/P negative for pancreatitis or other acute findings  - [name] followed in [facility] (Dr. Maximo Spar)    7. Type 3c DM - 2/2 chronic pancreatitis  - Follows w/ [facility]ocrinology at [facility]. - on insulin  8. Chronic abdominal swelling/bloating - Seen at [facility] (11/20) for IBS, per chart review. - Had penumatosis intestinalis. She was started on pancreatic enzyme replacement. - EGD (10/21): normal, bx taken to assess for celiac. - CT abdomen (2/22) and RUQ US  showed no obvious gallbladder stones. - HIDA (4/22): ended early due to tachypnea, but cystic duct and gallbladder appeared WNL. - CT abdomen 12/22 unremarkable  - US  abdomen 11/25/21: No ascites - suspect primarily fatty liver - Abd XR 12/27: mild gaseous distention  - Distended today in setting of volume overload (diuretics per above)   9. Poor Med Compliance/SDOH - Graduated from paramedicine 10/13/22. - Med compliance is questionable. Discuss re-referral next visit, if she is open to it.  10. Hypertension  - under poor control, several recent ED visits/Hospitalizations - suspect poor compliance playing a rolre - also suspect underlying OSA. She says she has completed Sleep study through [employer] but I do not see results in Epic - GDMT limited by CKD  - increase hydralazine to 75 mg tid today  - add  amlodipine 5 mg daily    F/u in 3- 4 wks to reassess volume status. Encouraged to improve compliance w/ CardioMEMs and send daily transmissions for better remote monitoring   Ruddy Corral, PA-C  7:27 AM  [facility] Failure [facility] [address] [facility] [place] [address] [phone] (office) [phone] (fax)

## 2023-07-10 NOTE — Patient Instructions (Addendum)
 Thank you for coming in today  If you had labs drawn today, any labs that are abnormal the clinic will call you No news is good news  Please send Cardiomems daily  Medications: Increase Torsemide to 60 mg 3 tablets daily Increase Hydralazine to 75 1 1/2 tablets 3 times daily  START Amlodipine 5 mg daily   Take Metolazone 2.5 mg with 20 meq of Potassium today 07/10/2023  Follow up appointments:  Your physician recommends that you schedule a follow-up appointment in:  3-4 weeks in clinic   Do the following things EVERYDAY: Weigh yourself in the morning before breakfast. Write it down and keep it in a log. Take your medicines as prescribed Eat low salt foods--Limit salt (sodium) to 2000 mg per day.  Stay as active as you can everyday Limit all fluids for the day to less than 2 liters   At the [facility], you and your health needs are our priority. As part of our continuing mission to provide you with exceptional heart care, we have created designated Provider Care Teams. These Care Teams include your primary Cardiologist (physician) and Advanced Practice Providers (APPs- Physician Assistants and Nurse Practitioners) who all work together to provide you with the care you need, when you need it.   You may see any of the following providers on your designated Care Team at your next follow up: Dr Jules Oar Dr Peder Bourdon Dr. Mimi Alt, NP Ruddy Corral, [place] [facility] [place], [place] Dennise Fitz, NP Luster Salters, PharmD   Please be sure to bring in all your medications bottles to every appointment.    Thank you for choosing [facility]  If you have any questions or concerns before your next appointment please send us  a message through [place] or call our office at [phone].    TO LEAVE A MESSAGE FOR THE NURSE SELECT OPTION 2, PLEASE LEAVE A MESSAGE INCLUDING: YOUR NAME DATE OF  BIRTH CALL BACK NUMBER REASON FOR CALL**this is important as we prioritize the call backs  YOU WILL RECEIVE A CALL BACK THE SAME DAY AS LONG AS YOU CALL BEFORE 4:00 PM

## 2023-07-11 ENCOUNTER — Telehealth (HOSPITAL_COMMUNITY): Payer: Self-pay | Admitting: *Deleted

## 2023-07-11 NOTE — Telephone Encounter (Signed)
 Called patient per Ruddy Corral, PA with following lab results and instructions:  "Labs stable. Instruct to take meds as directed in clinic today. Ok to take 1 x dose of KCL w/ metolazone dose."  Pt verbalized understanding of same, no further questions at this time.

## 2023-07-12 ENCOUNTER — Telehealth: Payer: Self-pay

## 2023-07-12 NOTE — Telephone Encounter (Signed)
 Jennifer [name] with [facility] called and left a message. They received the referral from us . They are scheduling out into mid - August. If that's ok with Dr. Lydia Sams, they will call her to schedule. If not OK,  Please let me know where you would like to try next

## 2023-08-01 ENCOUNTER — Telehealth: Payer: Self-pay

## 2023-08-01 ENCOUNTER — Telehealth: Payer: Self-pay | Admitting: Gastroenterology

## 2023-08-01 DIAGNOSIS — G4733 Obstructive sleep apnea (adult) (pediatric): Secondary | ICD-10-CM

## 2023-08-01 NOTE — Telephone Encounter (Signed)
  PSG showed moderate obstructive sleep apnea, AHI (4%) 28.4/hr. Snoring with oxygen desaturation to a nadir of 75% and mean 91.7%.   If patient is ok with starting CPAP please place an order for auto CPAP 5-15cm h20. Should aim to wear CPAP nightly 4-6 hours. FU in 31-90 days for compliance check.

## 2023-08-01 NOTE — Telephone Encounter (Signed)
 Good afternoon Dr. Rosaline Coma, I received a referral for this patient for chronic pancreatitis. Patient is wanting to come back to our practice due to her feeling like she not receiving the adequate care with [facility]. Patient has had a colonoscopy and endoscopy back in  2023. Would you please advise on scheduling this patient.    Thank you.

## 2023-08-01 NOTE — Telephone Encounter (Unsigned)
 Copied from CRM [id number]. Topic: Clinical - Lab/Test Results >> Jul 31, 2023 12:09 PM Crist Dominion wrote: Reason for CRM: Patient is requesting a phone call from Dr. Antonette Batters nurse as she has not been updated on her sleep study results from 4/1

## 2023-08-02 NOTE — Telephone Encounter (Signed)
 I called and spoke to pt. Pt informed of Beth's note and verbalized understanding. I informed pt that I would send order to [employer], but pt stated she does not have a car or anyone to help with transportation. I advised pt that I could see if the dme company was able to deliver. The other issue would be the mask fitting, if they were able to do so from pt's residence. Will place order and wait for any issues or advisement from [employer].

## 2023-08-04 ENCOUNTER — Telehealth (HOSPITAL_COMMUNITY): Payer: Self-pay

## 2023-08-04 NOTE — Telephone Encounter (Signed)
 Called to confirm/remind patient of their appointment at the [facility] on 07/08/23.   Appointment:   [x] Confirmed  [] Left mess   [] No answer/No voice mail  [] VM Full/unable to leave message  [] Phone not in service  Patient reminded to bring all medications and/or complete list.  Confirmed patient has transportation. Gave directions, instructed to utilize valet parking.

## 2023-08-07 ENCOUNTER — Ambulatory Visit (HOSPITAL_BASED_OUTPATIENT_CLINIC_OR_DEPARTMENT_OTHER)
Admission: RE | Admit: 2023-08-07 | Discharge: 2023-08-07 | Disposition: A | Discharge status: Home / Self Care | Source: Ambulatory Visit | Attending: Family Medicine | Admitting: Family Medicine

## 2023-08-07 ENCOUNTER — Emergency Department (HOSPITAL_BASED_OUTPATIENT_CLINIC_OR_DEPARTMENT_OTHER)
Admission: EM | Admit: 2023-08-07 | Discharge: 2023-08-07 | Disposition: A | Discharge status: Home / Self Care | Attending: Emergency Medicine | Admitting: Emergency Medicine

## 2023-08-07 ENCOUNTER — Encounter (HOSPITAL_BASED_OUTPATIENT_CLINIC_OR_DEPARTMENT_OTHER): Payer: Self-pay

## 2023-08-07 ENCOUNTER — Other Ambulatory Visit: Payer: Self-pay

## 2023-08-07 ENCOUNTER — Encounter (HOSPITAL_COMMUNITY): Payer: Self-pay

## 2023-08-07 VITALS — BP 142/82 | HR 117 | Ht <= 58 in | Wt 143.0 lb

## 2023-08-07 DIAGNOSIS — I428 Other cardiomyopathies: Secondary | ICD-10-CM | POA: Diagnosis not present

## 2023-08-07 DIAGNOSIS — Z794 Long term (current) use of insulin: Secondary | ICD-10-CM | POA: Insufficient documentation

## 2023-08-07 DIAGNOSIS — I130 Hypertensive heart and chronic kidney disease with heart failure and stage 1 through stage 4 chronic kidney disease, or unspecified chronic kidney disease: Secondary | ICD-10-CM | POA: Insufficient documentation

## 2023-08-07 DIAGNOSIS — I272 Pulmonary hypertension, unspecified: Secondary | ICD-10-CM

## 2023-08-07 DIAGNOSIS — I509 Heart failure, unspecified: Secondary | ICD-10-CM | POA: Diagnosis not present

## 2023-08-07 DIAGNOSIS — K760 Fatty (change of) liver, not elsewhere classified: Secondary | ICD-10-CM | POA: Diagnosis not present

## 2023-08-07 DIAGNOSIS — E786 Lipoprotein deficiency: Secondary | ICD-10-CM | POA: Insufficient documentation

## 2023-08-07 DIAGNOSIS — Q228 Other congenital malformations of tricuspid valve: Secondary | ICD-10-CM

## 2023-08-07 DIAGNOSIS — Z7982 Long term (current) use of aspirin: Secondary | ICD-10-CM | POA: Diagnosis not present

## 2023-08-07 DIAGNOSIS — I5032 Chronic diastolic (congestive) heart failure: Secondary | ICD-10-CM

## 2023-08-07 DIAGNOSIS — K861 Other chronic pancreatitis: Secondary | ICD-10-CM | POA: Insufficient documentation

## 2023-08-07 DIAGNOSIS — I2510 Atherosclerotic heart disease of native coronary artery without angina pectoris: Secondary | ICD-10-CM | POA: Insufficient documentation

## 2023-08-07 DIAGNOSIS — E781 Pure hyperglyceridemia: Secondary | ICD-10-CM | POA: Diagnosis not present

## 2023-08-07 DIAGNOSIS — N184 Chronic kidney disease, stage 4 (severe): Secondary | ICD-10-CM | POA: Diagnosis not present

## 2023-08-07 DIAGNOSIS — E0822 Diabetes mellitus due to underlying condition with diabetic chronic kidney disease: Secondary | ICD-10-CM | POA: Diagnosis not present

## 2023-08-07 DIAGNOSIS — E138 Other specified diabetes mellitus with unspecified complications: Secondary | ICD-10-CM

## 2023-08-07 DIAGNOSIS — R739 Hyperglycemia, unspecified: Secondary | ICD-10-CM

## 2023-08-07 DIAGNOSIS — R000 Tachycardia, unspecified: Secondary | ICD-10-CM | POA: Diagnosis not present

## 2023-08-07 DIAGNOSIS — Z91148 Patient's other noncompliance with medication regimen for other reason: Secondary | ICD-10-CM

## 2023-08-07 DIAGNOSIS — R140 Abdominal distension (gaseous): Secondary | ICD-10-CM | POA: Diagnosis not present

## 2023-08-07 DIAGNOSIS — E1165 Type 2 diabetes mellitus with hyperglycemia: Secondary | ICD-10-CM | POA: Diagnosis not present

## 2023-08-07 DIAGNOSIS — Z853 Personal history of malignant neoplasm of breast: Secondary | ICD-10-CM | POA: Insufficient documentation

## 2023-08-07 DIAGNOSIS — I5022 Chronic systolic (congestive) heart failure: Secondary | ICD-10-CM | POA: Diagnosis present

## 2023-08-07 DIAGNOSIS — Z139 Encounter for screening, unspecified: Secondary | ICD-10-CM

## 2023-08-07 DIAGNOSIS — I081 Rheumatic disorders of both mitral and tricuspid valves: Secondary | ICD-10-CM | POA: Diagnosis not present

## 2023-08-07 DIAGNOSIS — I10 Essential (primary) hypertension: Secondary | ICD-10-CM

## 2023-08-07 LAB — BASIC METABOLIC PANEL WITH GFR
Anion gap: 16 — ABNORMAL HIGH (ref 5–15)
Anion gap: 18 — ABNORMAL HIGH (ref 5–15)
Anion gap: 16 — ABNORMAL HIGH (ref 5–15)
BUN: 50 mg/dL — ABNORMAL HIGH (ref 6–20)
BUN: 52 mg/dL — ABNORMAL HIGH (ref 6–20)
BUN: 53 mg/dL — ABNORMAL HIGH (ref 6–20)
CO2: 20 mmol/L — ABNORMAL LOW (ref 22–32)
CO2: 21 mmol/L — ABNORMAL LOW (ref 22–32)
CO2: 23 mmol/L (ref 22–32)
Calcium: 10.8 mg/dL — ABNORMAL HIGH (ref 8.9–10.3)
Calcium: 11.2 mg/dL — ABNORMAL HIGH (ref 8.9–10.3)
Calcium: 10.7 mg/dL — ABNORMAL HIGH (ref 8.9–10.3)
Chloride: 92 mmol/L — ABNORMAL LOW (ref 98–111)
Chloride: 92 mmol/L — ABNORMAL LOW (ref 98–111)
Chloride: 96 mmol/L — ABNORMAL LOW (ref 98–111)
Creatinine, Ser: 2.18 mg/dL — ABNORMAL HIGH (ref 0.44–1.00)
Creatinine, Ser: 2.4 mg/dL — ABNORMAL HIGH (ref 0.44–1.00)
Creatinine, Ser: 2.36 mg/dL — ABNORMAL HIGH (ref 0.44–1.00)
GFR, Estimated: 30 mL/min — ABNORMAL LOW (ref >60)
GFR, Estimated: 27 mL/min — ABNORMAL LOW (ref >60)
GFR, Estimated: 27 mL/min — ABNORMAL LOW (ref >60)
Glucose, Bld: 601 mg/dL (ref 70–99)
Glucose, Bld: 241 mg/dL — ABNORMAL HIGH (ref 70–99)
Glucose, Bld: 80 mg/dL (ref 70–99)
Potassium: 3.9 mmol/L (ref 3.5–5.1)
Potassium: 3.7 mmol/L (ref 3.5–5.1)
Potassium: 3.6 mmol/L (ref 3.5–5.1)
Sodium: 128 mmol/L — ABNORMAL LOW (ref 135–145)
Sodium: 131 mmol/L — ABNORMAL LOW (ref 135–145)
Sodium: 134 mmol/L — ABNORMAL LOW (ref 135–145)

## 2023-08-07 LAB — I-STAT VENOUS BLOOD GAS, ED
Acid-base deficit: 2 mmol/L (ref 0.0–2.0)
Bicarbonate: 23.6 mmol/L (ref 20.0–28.0)
Calcium, Ion: 1.4 mmol/L (ref 1.15–1.40)
HCT: 33 % — ABNORMAL LOW (ref 36.0–46.0)
Hemoglobin: 11.2 g/dL — ABNORMAL LOW (ref 12.0–15.0)
O2 Saturation: 84 %
Patient temperature: 98.3
Potassium: 3.8 mmol/L (ref 3.5–5.1)
Sodium: 133 mmol/L — ABNORMAL LOW (ref 135–145)
TCO2: 25 mmol/L (ref 22–32)
pCO2, Ven: 43.7 mmHg — ABNORMAL LOW (ref 44–60)
pH, Ven: 7.339 (ref 7.25–7.43)
pO2, Ven: 52 mmHg — ABNORMAL HIGH (ref 32–45)

## 2023-08-07 LAB — CBG MONITORING, ED
Glucose-Capillary: 299 mg/dL — ABNORMAL HIGH (ref 70–99)
Glucose-Capillary: 128 mg/dL — ABNORMAL HIGH (ref 70–99)

## 2023-08-07 LAB — CBC WITH DIFFERENTIAL/PLATELET
Abs Immature Granulocytes: 0.03 10*3/uL (ref 0.00–0.07)
Basophils Absolute: 0 10*3/uL (ref 0.0–0.1)
Basophils Relative: 1 %
Eosinophils Absolute: 0.1 10*3/uL (ref 0.0–0.5)
Eosinophils Relative: 1 %
HCT: 36.7 % (ref 36.0–46.0)
Hemoglobin: 12.8 g/dL (ref 12.0–15.0)
Immature Granulocytes: 1 %
Lymphocytes Relative: 36 %
Lymphs Abs: 2.3 10*3/uL (ref 0.7–4.0)
MCH: 31.3 pg (ref 26.0–34.0)
MCHC: 34.9 g/dL (ref 30.0–36.0)
MCV: 89.7 fL (ref 80.0–100.0)
Monocytes Absolute: 0.5 10*3/uL (ref 0.1–1.0)
Monocytes Relative: 8 %
Neutro Abs: 3.5 10*3/uL (ref 1.7–7.7)
Neutrophils Relative %: 53 %
Platelets: 182 10*3/uL (ref 150–400)
RBC: 4.09 MIL/uL (ref 3.87–5.11)
RDW: 14.1 % (ref 11.5–15.5)
WBC: 6.4 10*3/uL (ref 4.0–10.5)
nRBC: 0 % (ref 0.0–0.2)

## 2023-08-07 LAB — BETA-HYDROXYBUTYRIC ACID: Beta-Hydroxybutyric Acid: <0.05 mmol/L — ABNORMAL LOW (ref 0.05–0.27)

## 2023-08-07 MED ORDER — CARVEDILOL 6.25 MG PO TABS: 9.3750 mg | ORAL_TABLET | Freq: Two times a day (BID) | ORAL | 3 refills | Status: DC

## 2023-08-07 MED ORDER — LACTATED RINGERS IV BOLUS
1000.0000 mL | Freq: Once | INTRAVENOUS | Status: AC
  Administered 2023-08-07: 1000 mL via INTRAVENOUS

## 2023-08-07 NOTE — ED Triage Notes (Signed)
 Pt reports that "sugar's been high & I can't get it down since yesterday." Reports increased thirst, abd pain, denies vomiting, increased urination. Reports dexcom "just showed over 400."

## 2023-08-07 NOTE — Progress Notes (Addendum)
 Advanced Heart Failure Clinic Note   PCP: Jennifer Catholic, PA-C HF Cardiologist: Dr. 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   Has been followed by [name] for her HF.    Admitted 11/22/20 with ab pain and diarrhea. Ab CT concerning for vasculitis (no pancreatitis). Her TG were found to be 4030. She was started on an insulin /dextrose  infusion. Echo 11/28/20 EF 20-25% with moderate RV dysfunction and mild septal flattening. Started on DBA 2.5. cMRI c/w prior myocarditis vs sarcoid (see below). Mod pericardial effusion also noted. LVEF 29%. RV normal. She underwent R/LHC showing single vessel RCA occlusion, RA 1, LVEDP 17, Fick CO 5.6/CI 3.88. She was able to be weaned off inotropes and GDMT titrated.     Post [name] follow up, she was drinking >2L/day fluid and eating more salty foods, volume was up, ReDs 43%, She followed up with [name] with [facility] 12/17/20 for a 2nd opinion. He placed LifeVest and switched beta blocker to Toprol  XL.   Echo (10/22): EF up to 45-50%, grade II DD, RV ok. LifeVest off.   Treated in ED 10/22 for pancreatitis, then admitted 11/22 for hyperglycemia, did not meet criteria for DKA. Hospitalization c/b hyponatremia, sodium 118. Concern for volume overload. Lasix  restarted but SCr increased. Nephrology consulted and restarted gentle IVF and GDMT held for a couple of days.    Echo 12/22 EF 45%, RV normal.    Follow up 08/02/21, weight up 16 lbs, ReDs 27%. Arranged for RHC to better assess hemodynamics and see if abdominal symptoms related to right-sided HF due to high-output state or non-cardiac (see results below).    RHC 5/23 with elevated filling pressures and normal cardiac ouput. Mild to moderate mixed pulmonary hypertension with  prominent v-waves in RA, suggestive of significant TR. Given IV lasix  and metolazone  2.5 added weekly to diuretic regimen.    RA = 12 prominent V waves, RV = 47/14, PA = 48/25 (37), PCW = 25, Fick cardiac output/index = 4.4/2.8, PVR =3.7 WU, Ao sat = 99%, PA sat = 62%, 62%   Post cath follow up, volume up, REDs 43%. Torsemide  increased and weekly metolazone  added.   Admitted 5/23 with AKI and hyperkalemia. Given IVF, insulin , calcium  gluconate and Lokelma . Echo showed EF 45-50%, RV normal, mild-mod MR, RVSP 30. Only mild TR. Restarted on torsemide  40 bid (lower dose) and Entresto  49/51 bid. Arranged paramedicine, discharged home 143 lbs.   Seen 09/27/21 at [name] for 2nd opinion, felt to be congested and recommended increasing torsemide  to 60 mg bid and instructed f/u in 3 months.   Received Furoscix  8/4, 8/5, and 10/31/21.  Admitted 9/23 for volume overload in setting of RV failure and AKI. Required milrinone . Underwent Cardiomems implant  RHC 9/23  RA 15, PA 37/23 (28), PCWP 16, Fick CO/CI 4.2/2.6, Thermo CO/CI 3.7/2.3, PVR 4.3, PA sat 51%, 54%, PAPi 0.93  Previously being followed by Dr. Mammie [name] at [name]. Had renal biopsy recently (12/31/21) and showed DM and HTN nephropathy. They discussed starting HD soon. Now being followed by [name].   Echo 10/04/22 EF 55% RV ok. Mild AI   Continues to have multiple hospitalizations for hyperglycemia, hyperTG (>5,000) and uncontrolled HTN. It is suspected she has OSA. Observed apnea noted previous hospitalizations. She has been referred to [name] and  they have recommended sleep study.   Today she returns for HF follow up. Overall feeling fine. [name] SOB when she is drinking fluids, no issues with walking on flat ground or with ADLs. Occasional palpitations. 1-2 episodes of low BP and dizziness, no falls or syncope. Denies abnormal bleeding, CP, edema, or PND/Orthopnea. Appetite ok. Weight at home 143 pounds. Taking all medications. Had sleep study  showing moderate OSA, awaiting CPAP.  ROS: All systems reviewed and negative except as per HPI.   Past Medical History:  Diagnosis Date   Afib (HCC) 05/12/2021   Brain tumor (HCC) 03/29/1995   astrocytoma   CHF (congestive heart failure) (HCC)    Cholesterosis    CKD (chronic kidney disease) stage 4, GFR 15-29 ml/min (HCC) 05/13/2021   DM (diabetes mellitus) (HCC) 10/10/2018   Fatty liver    HTN (hypertension) 10/10/2018   Hypothyroidism 10/10/2018   Lipoprotein deficiency    Lung disease    longevity long term   Pancreatitis    Polycystic ovary syndrome    Current Outpatient Medications  Medication Sig Dispense Refill   amitriptyline  (ELAVIL ) 10 MG tablet Take 10 mg by mouth at bedtime.     amLODipine  (NORVASC ) 5 MG tablet Take 1 tablet (5 mg total) by mouth daily. 90 tablet 3   aspirin  EC 81 MG EC tablet Take 1 tablet (81 mg total) by mouth daily. Swallow whole. 30 tablet 1   azelastine  (ASTELIN ) 0.1 % nasal spray Place 2 sprays into both nostrils daily at 6 (six) AM.     BLISOVI  FE 1.5/30 1.5-30 MG-MCG tablet Take 1 tablet by mouth daily.     butalbital -acetaminophen -caffeine  (FIORICET ) 50-325-40 MG tablet Take 1 tablet by mouth daily as needed for headache.     CREON  36000-114000 units CPEP capsule Take 1 capsule (36,000 Units total) by mouth 3 (three) times daily. 90 capsule 3   DULoxetine  (CYMBALTA ) 60 MG capsule Take 60 mg by mouth at bedtime.     ergocalciferol  (VITAMIN D2) 1.25 MG (50000 UT) capsule Take 50,000 Units by mouth every 7 (seven) days. Every Sunday     Evolocumab  (REPATHA ) 140 MG/ML SOSY Inject 140 mg into the skin every 14 (fourteen) days. Saturdays     fenofibrate  54 MG tablet Take 1 tablet (54 mg total) by mouth daily. (Patient taking differently: Take 54 mg by mouth at bedtime.) 30 tablet 11   hydrALAZINE  (APRESOLINE ) 50 MG tablet Take 1.5 tablets (75 mg total) by mouth 3 (three) times daily. 135 tablet 8   insulin  lispro (HUMALOG ) 100 UNIT/ML injection  Inject 30-40 Units into the skin See admin instructions. Inject subcutaneously 40 units with breakfast, 30 units with lunch and 30 units with dinner     ivabradine  (CORLANOR ) 7.5 MG TABS tablet Take 1 tablet (7.5 mg total) by mouth 2 (two) times daily with a meal. (Patient taking differently: Take 7.5 mg by mouth at bedtime.) 60 tablet 6   levocetirizine (XYZAL ) 5 MG tablet Take 5 mg by mouth daily.     levothyroxine  (SYNTHROID ) 137 MCG tablet Take 137 mcg by mouth daily.     metoCLOPramide  (REGLAN ) 5 MG tablet Take 1 tablet (5 mg total) by mouth every 8 (eight) hours as needed for nausea or vomiting. 90 tablet 0   pantoprazole  (PROTONIX ) 40 MG tablet Take 40 mg by mouth daily.     polyethylene glycol (MIRALAX  / GLYCOLAX ) 17 g packet Take 17 g by mouth daily as needed for mild constipation. Also available OTC  30 each 0   pregabalin  (LYRICA ) 300 MG capsule Take 300 mg by mouth 2 (two) times daily.     prochlorperazine  (COMPAZINE ) 10 MG tablet Take 10 mg by mouth at bedtime.     promethazine  (PHENERGAN ) 25 MG suppository Place 1 suppository (25 mg total) rectally every 6 (six) hours as needed for nausea or vomiting. 12 each 0   rosuvastatin  (CRESTOR ) 40 MG tablet Take 40 mg by mouth daily.     Safety Seal Miscellaneous MISC Hormonic Hair Solution with minoxidil USP 7% and finasteride USP 0.05% - apply to affected areas daily every morning. 30 mL 2   sodium bicarbonate  650 MG tablet Take 2 tablets (1,300 mg total) by mouth 2 (two) times daily. 120 tablet 3   SUMAtriptan  (IMITREX ) 25 MG tablet Take 25 mg by mouth every 2 (two) hours as needed.     tamsulosin  (FLOMAX ) 0.4 MG CAPS capsule Take 1 capsule (0.4 mg total) by mouth daily. 30 capsule 0   topiramate  (TOPAMAX ) 50 MG tablet Take 50 mg by mouth 2 (two) times daily.     torsemide  (DEMADEX ) 20 MG tablet Take 3 tablets (60 mg total) by mouth daily. 90 tablet 8   tretinoin  (RETIN-A ) 0.05 % cream Apply 1 application topically at bedtime.     carvedilol   (COREG ) 6.25 MG tablet Take 1 tablet (6.25 mg total) by mouth 2 (two) times daily with a meal. 60 tablet 2   CRANBERRY CONCENTRATE PO Take 2 tablets by mouth 2 (two) times daily.     Insulin  Pen Needle 32G X 4 MM MISC 1 Device by Does not apply route QID. For use with insulin  pens 100 each 0   insulin  regular human CONCENTRATED (HUMULIN  R U-500 KWIKPEN) 500 UNIT/ML KwikPen Inject 185 Units into the skin 3 (three) times daily with meals. (Patient taking differently: Inject 195 Units into the skin 2 (two) times daily with a meal. Inject subcutaneously 195 units with breakfast and lunch) 33.3 mL 0   sucralfate  (CARAFATE ) 1 g tablet Take 1 tablet (1 g total) by mouth with breakfast, with lunch, and with evening meal for 7 days. 21 tablet 0   No current facility-administered medications for this encounter.   Allergies  Allergen Reactions   Icosapent  Ethyl (Epa Ethyl Ester) (Fish)     Hives w/Vascepa    Ketamine Other (See Comments)    In vegetative state for 15 minutes per pt   [place] Itching    Itchy throat  Itchy throat Itchy throat Itchy throat Itchy throat    Itchy throat  Itchy throat Itchy throat Itchy throat Itchy throat    Itchy throat   Morphine  Itching, Rash and Hives    Sorethroat   Penicillins Hives, Itching and Rash    Has patient had a PCN reaction causing immediate rash, facial/tongue/throat swelling, SOB or lightheadedness with hypotension: Y Has patient had a PCN reaction causing severe rash involving mucus membranes or skin necrosis: Y Has patient had a PCN reaction that required hospitalization: N Has patient had a PCN reaction occurring within the last 10 years: Y   Clarithromycin Other (See Comments)    Stomach pain   Erythromycin      Stomach pain   Fentanyl  Nausea And Vomiting   Penicillin V Hives, Itching and Other (See Comments)    Gi upset, also   Shellfish Allergy Other (See Comments)    Pt has never had shellfish but tested positive on allergy test. Pt  states contrast in CT is  okay   Prednisone Rash   Social History   Socioeconomic History   Marital status: Divorced    Spouse name: Not on file   Number of children: 0   Years of education: Not on file   Highest education level: Not on file  Occupational History   Occupation: [employer]  Tobacco Use   Smoking status: Never   Smokeless tobacco: Never  Vaping Use   Vaping status: Never Used  Substance and Sexual Activity   Alcohol  use: Never   Drug use: Never   Sexual activity: Yes  Other Topics Concern   Not on file  Social History Narrative   Not on file   Social Drivers of Health   Financial Resource Strain: Low Risk  (05/31/2023)   Received from [name]   Overall Financial Resource Strain (CARDIA)    Difficulty of Paying Living Expenses: Not hard at all  Food Insecurity: No Food Insecurity (05/31/2023)   Received from [facility]   Hunger Vital Sign    Worried About Running Out of Food in the Last Year: Never true    Ran Out of Food in the Last Year: Never true  Transportation Needs: No Transportation Needs (05/31/2023)   Received from [facility] - Transportation    Lack of Transportation (Medical): No    Lack of Transportation (Non-Medical): No  Physical Activity: Unknown (05/31/2023)   Received from [facility]   Exercise Vital Sign    Days of Exercise per Week: Patient declined    Minutes of Exercise per Session: 0 min  Stress: No Stress Concern Present (05/31/2023)   Received from [facility] - Occupational Stress Questionnaire    Feeling of Stress : Not at all  Social Connections: Socially Integrated (05/31/2023)   Received from [facility]   Social Network    How would you rate your social network (family, work, friends)?: Good participation with social networks  Intimate Partner Violence: Patient Declined (05/31/2023)   Received from [facility]   HITS    Over the last 12 months how often  did your partner physically hurt you?: Patient declined    Over the last 12 months how often did your partner insult you or talk down to you?: Patient declined    Over the last 12 months how often did your partner threaten you with physical harm?: Patient declined    Over the last 12 months how often did your partner scream or curse at you?: Patient declined   Family History  Problem Relation Age of Onset   Diabetes Mother    Hypertension Mother    Hyperlipidemia Mother    Thyroid  disease Mother    Hypertension Father    Diabetes Father    Pancreatic cancer Paternal Aunt    Pancreatic cancer Paternal Uncle    Colon cancer Neg Hx    Esophageal cancer Neg Hx    Stomach cancer Neg Hx    BP (!) 142/82   Pulse (!) 117   Ht 4\' 9"  (1.448 m)   Wt 64.9 kg (143 lb)   SpO2 97%   BMI 30.94 kg/m   Wt Readings from Last 3 Encounters:  08/07/23 64.9 kg (143 lb)  07/10/23 64.8 kg (142 lb 12.8 oz)  07/02/23 65.3 kg (144 lb)   PHYSICAL EXAM: General:  NAD. No resp difficulty HEENT: + left eye ptosis Neck: Supple. No JVD. Cor: Tachy rate & rhythm. No rubs,  gallops or murmurs. Lungs: Clear Abdomen: Soft, nontender, +distended.  Extremities: No cyanosis, clubbing, rash, edema Neuro: Alert & oriented x 3, moves all 4 extremities w/o difficulty. Affect pleasant.  Cardiomems (personally reviewed): PAD 19 today (Goal 17)   ECG (personally reviewed): ST 116 bpm  ASSESSMENT & PLAN: Nonischemic cardiomyopathy, recovered EF, previously with HFrEF (EF 29%) - Echo (5/23): EF 45-50%, RV normal  - Echo 9/23: EF 50-55%, RV normal. mild MR, mild-mod TR - Echo 3/24 EF 45-50% mild-mod TR, moderate MR - Echo (06/10/22) EF 30-35% (lower than past), RV mildly dilated, mod-sev MR, sev TR. - Echo 10/04/22 EF 55% RV ok. Mild AI  - NYHA II, volume OK today on exam and by Cardiomems. - Increase Coreg  to 9.375 mg bid - Continue torsemide  60 mg daily. - Continue hydralazine  75 mg tid - Continue ivabradine  7.5  mg bid.  - Off spiro due to hyperkalemia  - Off Jardiance due to frequent GU infections  - Off ARB/ARNI with CKD and AKIs - I asked her to use Cardiomems daily - Labs today. - Repeat echo next visit.   2. CAD - Single vessel RCA occlusion (small co-dominant vessel) on cardiac cath 09/08. - No chest pain - Continue medical management   3.  CKD IV - Frequent AKIs - Scr baseline variable with multiple AKIs, typically 2.0-2.5 - Previously followed by Dr. Mammie [name] (Nephrology at [name]).  - Had renal biopsy (12/31/21) and showed DM and HTN nephropathy.  - Now followed by [name] - Labs today   4. Pulmonary hypertension - Mild to moderate on RHC 5/23. PVR 3.7 WU. - RHC 12/06/21: RA 15, PA 37/23 (28), PCWP 16, Fick CO/CI 4.2/2.6, Thermo CO/CI 3.7/2.3, PVR 4.3, PA sat 51%, 54%, PAPi 0.93  - VQ negative - She stopped sildenafil  with dizziness and low BP   5. TR - Prominent v-waves in RA tracing on RHC 5/23. - Echo 5/23 shows only mild TR  - Mild to mod on 9/23 echo - Moderate on echo (3/24) - Mild on echo (7/24)  6. HTN - BP poorly controlled, several recent ED visits/hospitalizations - Suspect poor med compliance playing a role - Suspect underlying OSA. She says she has completed Sleep study through [employer] [facility] but I do not see results in Epic - GDMT limited by CKD  - Increase Coreg  as above - Needs treatment of OSA, awaiting CPAP   7. Severe hyperTG - Due to LPL deficiency w/ recent pancreatitis. - Continue fenofibrate   - Recent admission >5000 - CTA/P negative for pancreatitis or other acute findings  - [name] followed in [facility] (Dr. Maximo Spar)    8. Type 3c DM - 2/2 chronic pancreatitis  - Follows w/ endocrinology at [name]. - On insulin   9. Chronic abdominal swelling/bloating - Seen at [facility] (11/20) for IBS, per chart review. - Had penumatosis intestinalis. She was started on pancreatic enzyme replacement. - EGD (10/21): normal, bx taken to assess for celiac. - CT  abdomen (2/22) and RUQ US  showed no obvious gallbladder stones. - HIDA (4/22): ended early due to tachypnea, but cystic duct and gallbladder appeared WNL. - CT abdomen 12/22 unremarkable  - US  abdomen 11/25/21: No ascites - suspect primarily fatty liver - Abd XR 12/27: mild gaseous distention  - No change  10. Poor Med Compliance/SDOH - Graduated from paramedicine 10/13/22. - Med compliance is questionable.  - Unfortunately not [facility] patient so unable to re-refer  Follow up in 4 months with Dr. Julane Ny + echo.  Jennifer Hacking, FNP  1:37 PM  Advanced Heart Failure Clinic [facility] [address] Heart and Vascular [place] [address] [phone] (office) [phone] (fax)

## 2023-08-07 NOTE — Patient Instructions (Addendum)
 Thank you for coming in today  If you had labs drawn today, any labs that are abnormal the clinic will call you No news is good news  Medications: Increase Coreg  to 9.375 1 1/2 tablet twice daily   Follow up appointments:  Your physician recommends that you schedule a follow-up appointment in:  4 months with With Dr. [name] with echocardiogram Please call our office to schedule the follow-up appointment call in July for September 2025.    Do the following things EVERYDAY: Weigh yourself in the morning before breakfast. Write it down and keep it in a log. Take your medicines as prescribed Eat low salt foods--Limit salt (sodium) to 2000 mg per day.  Stay as active as you can everyday Limit all fluids for the day to less than 2 liters   At the Advanced [facility] Failure Clinic, you and your health needs are our priority. As part of our continuing mission to provide you with exceptional heart care, we have created designated Provider Care Teams. These Care Teams include your primary Cardiologist (physician) and Advanced Practice Providers (APPs- Physician Assistants and Nurse Practitioners) who all work together to provide you with the care you need, when you need it.   You may see any of the following providers on your designated Care Team at your next follow up: Dr Jules Oar Dr Peder Bourdon Dr. Mimi Alt, NP Ruddy Corral, [place] [facility] [place], [place] Dennise Fitz, NP Luster Salters, PharmD   Please be sure to bring in all your medications bottles to every appointment.    Thank you for choosing [facility]  If you have any questions or concerns before your next appointment please send us  a message through [place] or call our office at [phone].    TO LEAVE A MESSAGE FOR THE NURSE SELECT OPTION 2, PLEASE LEAVE A MESSAGE INCLUDING: YOUR NAME DATE OF BIRTH CALL BACK NUMBER REASON FOR CALL**this is  important as we prioritize the call backs  YOU WILL RECEIVE A CALL BACK THE SAME DAY AS LONG AS YOU CALL BEFORE 4:00 PM

## 2023-08-07 NOTE — Addendum Note (Signed)
 Encounter addended by: Elmarie Hacking, FNP on: 08/07/2023 2:42 PM  Actions taken: Clinical Note Signed

## 2023-08-07 NOTE — ED Provider Notes (Signed)
 [facility] AT [facility] Provider Note   CSN: [id number] Arrival date & time: 08/07/23  1606     History  Chief Complaint  Patient presents with   Hyperglycemia    Jennifer [name] is a 33 y.o. female with history of type 2 diabetes on high-dose insulin , heart failure, presenting to the emergency department with concern for hyperglycemia and excessive thirst and urination.  Patient reports she has been feeling this way for several days.  Her Dexcom monitor is showing blood sugar "over 400, cannot be read".  She has been compliant with her insulin .  Typically she takes 60 units of short acting insulin  3 times daily with meals.  She also takes 300 units of insulin  RU 500 long acting in the AM and 200 units of inuslin RU 500 at bedtime.   This morning she took 60 units of insulin  short acting Humalog  when she woke up, then her 300 units of long-acting insulin  around noon, and then took an additional 120 units of fast acting insulin  at 1230.  She called her doctor told her to come into the ED because "I might be in DKA.  Of note the patient has a history of congestive heart failure takes torsemide  60 mg daily as well  HPI     Home Medications Prior to Admission medications   Medication Sig Start Date End Date Taking? Authorizing Provider  amitriptyline  (ELAVIL ) 10 MG tablet Take 10 mg by mouth at bedtime.    [provider]  amLODipine  (NORVASC ) 5 MG tablet Take 1 tablet (5 mg total) by mouth daily. 07/10/23 10/08/23  Horace Lye, PA-C  aspirin  EC 81 MG EC tablet Take 1 tablet (81 mg total) by mouth daily. Swallow whole. 12/07/20   Darus Engels A, DO  azelastine  (ASTELIN ) 0.1 % nasal spray Place 2 sprays into both nostrils daily at 6 (six) AM.    [provider]  BLISOVI  FE 1.5/30 1.5-30 MG-MCG tablet Take 1 tablet by mouth daily.    [provider]  butalbital -acetaminophen -caffeine  (FIORICET ) 50-325-40 MG tablet Take 1  tablet by mouth daily as needed for headache. 09/26/22   [provider]  carvedilol  (COREG ) 6.25 MG tablet Take 1.5 tablets (9.375 mg total) by mouth 2 (two) times daily. 08/07/23   Milford, Arlice Bene, FNP  CRANBERRY CONCENTRATE PO Take 2 tablets by mouth 2 (two) times daily.    [provider]  CREON  36000-114000 units CPEP capsule Take 1 capsule (36,000 Units total) by mouth 3 (three) times daily. 05/10/23   Rai, Hurman Maiden, MD  DULoxetine  (CYMBALTA ) 60 MG capsule Take 60 mg by mouth at bedtime. 09/26/22   [provider]  ergocalciferol  (VITAMIN D2) 1.25 MG (50000 UT) capsule Take 50,000 Units by mouth every 7 (seven) days. Every Sunday    [provider]  Evolocumab  (REPATHA ) 140 MG/ML SOSY Inject 140 mg into the skin every 14 (fourteen) days. Saturdays    [provider]  fenofibrate  54 MG tablet Take 1 tablet (54 mg total) by mouth daily. Patient taking differently: Take 54 mg by mouth at bedtime. 12/01/22   [name], Aviva Lemmings, MD  hydrALAZINE  (APRESOLINE ) 50 MG tablet Take 1.5 tablets (75 mg total) by mouth 3 (three) times daily. 07/10/23   Horace Lye, PA-C  insulin  lispro (HUMALOG ) 100 UNIT/ML injection Inject 30-40 Units into the skin See admin instructions. Inject subcutaneously 40 units with breakfast, 30 units with lunch and 30 units with dinner  [provider]  Insulin  Pen Needle 32G X 4 MM MISC 1 Device by Does not apply route QID. For use with insulin  pens 02/13/22   Oral Billings, MD  insulin  regular human CONCENTRATED (HUMULIN  R U-500 KWIKPEN) 500 UNIT/ML KwikPen Inject 185 Units into the skin 3 (three) times daily with meals. Patient taking differently: Inject 195 Units into the skin 2 (two) times daily with a meal. Inject subcutaneously 195 units with breakfast and lunch 09/20/22 01/09/24  Ezenduka, Nkeiruka J, MD  ivabradine  (CORLANOR ) 7.5 MG TABS tablet Take 1 tablet (7.5 mg total) by mouth 2 (two) times daily with a  meal. Patient taking differently: Take 7.5 mg by mouth at bedtime. 01/31/23   Elmarie Hacking, FNP  levocetirizine (XYZAL ) 5 MG tablet Take 5 mg by mouth daily. 03/05/22   [provider]  levothyroxine  (SYNTHROID ) 137 MCG tablet Take 137 mcg by mouth daily. 04/12/23   [provider]  metoCLOPramide  (REGLAN ) 5 MG tablet Take 1 tablet (5 mg total) by mouth every 8 (eight) hours as needed for nausea or vomiting. 05/10/23 05/09/24  Rai, Hurman Maiden, MD  pantoprazole  (PROTONIX ) 40 MG tablet Take 40 mg by mouth daily. 12/24/19   [provider]  polyethylene glycol (MIRALAX  / GLYCOLAX ) 17 g packet Take 17 g by mouth daily as needed for mild constipation. Also available OTC 05/10/23   Rai, Ripudeep K, MD  pregabalin  (LYRICA ) 300 MG capsule Take 300 mg by mouth 2 (two) times daily.    [provider]  prochlorperazine  (COMPAZINE ) 10 MG tablet Take 10 mg by mouth at bedtime.    [provider]  promethazine  (PHENERGAN ) 25 MG suppository Place 1 suppository (25 mg total) rectally every 6 (six) hours as needed for nausea or vomiting. 06/22/23   Lucina Sabal, PA-C  rosuvastatin  (CRESTOR ) 40 MG tablet Take 40 mg by mouth daily. 08/09/21   [provider]  Safety Seal Miscellaneous MISC Hormonic Hair Solution with minoxidil USP 7% and finasteride USP 0.05% - apply to affected areas daily every morning. 06/06/23   Dellar Fenton, DO  sodium bicarbonate  650 MG tablet Take 2 tablets (1,300 mg total) by mouth 2 (two) times daily. 05/10/23   Rai, Hurman Maiden, MD  sucralfate  (CARAFATE ) 1 g tablet Take 1 tablet (1 g total) by mouth with breakfast, with lunch, and with evening meal for 7 days. 06/18/23 06/25/23  Russella Courts A, DO  SUMAtriptan  (IMITREX ) 25 MG tablet Take 25 mg by mouth every 2 (two) hours as needed. 01/30/23   [provider]  tamsulosin  (FLOMAX ) 0.4 MG CAPS capsule Take 1 capsule (0.4 mg total) by mouth daily. 05/10/23   Rai, Hurman Maiden, MD  topiramate   (TOPAMAX ) 50 MG tablet Take 50 mg by mouth 2 (two) times daily.    [provider]  torsemide  (DEMADEX ) 20 MG tablet Take 3 tablets (60 mg total) by mouth daily. 07/10/23   Ruddy Corral M, PA-C  tretinoin  (RETIN-A ) 0.05 % cream Apply 1 application topically at bedtime. 01/17/21   [provider]      Allergies    Icosapent  ethyl (epa ethyl ester) (fish), Ketamine, [place], Morphine , Penicillins, Clarithromycin, Erythromycin , Fentanyl , Penicillin v, Shellfish allergy, and Prednisone    Review of Systems   Review of Systems  Physical Exam Updated Vital Signs BP 119/63   Pulse 92   Temp 98.9 F (37.2 C) (Oral)   Resp 19   SpO2 95%  Physical Exam Constitutional:  General: [name] not in acute distress. HENT:     Head: Normocephalic and atraumatic.  Eyes:     Conjunctiva/sclera: Conjunctivae normal.     Pupils: Pupils are equal, round, and reactive to light.  Cardiovascular:     Rate and Rhythm: Regular rhythm. Tachycardia present.  Pulmonary:     Effort: Pulmonary effort is normal. No respiratory distress.  Abdominal:     General: There is no distension.     Tenderness: There is no abdominal tenderness.  Skin:    General: Skin is warm and dry.  Neurological:     General: No focal deficit present.     Mental Status: [name] alert. Mental status is at baseline.  Psychiatric:        Mood and Affect: Mood normal.        Behavior: Behavior normal.     ED Results / Procedures / Treatments   Labs (all labs ordered are listed, but only abnormal results are displayed) Labs Reviewed  BASIC METABOLIC PANEL WITH GFR - Abnormal; Notable for the following components:      Result Value   Sodium 131 (*)    Chloride 92 (*)    CO2 21 (*)    Glucose, Bld 241 (*)    BUN 52 (*)    Creatinine, Ser 2.40 (*)    Calcium  11.2 (*)    GFR, Estimated 27 (*)    Anion gap 18 (*)    All other components within normal limits  BETA-HYDROXYBUTYRIC ACID - Abnormal;  Notable for the following components:   Beta-Hydroxybutyric Acid <0.05 (*)    All other components within normal limits  BASIC METABOLIC PANEL WITH GFR - Abnormal; Notable for the following components:   Sodium 134 (*)    Chloride 96 (*)    BUN 53 (*)    Creatinine, Ser 2.36 (*)    Calcium  10.7 (*)    GFR, Estimated 27 (*)    Anion gap 16 (*)    All other components within normal limits  CBG MONITORING, ED - Abnormal; Notable for the following components:   Glucose-Capillary 299 (*)    All other components within normal limits  I-STAT VENOUS BLOOD GAS, ED - Abnormal; Notable for the following components:   pCO2, Ven 43.7 (*)    pO2, Ven 52 (*)    Sodium 133 (*)    HCT 33.0 (*)    Hemoglobin 11.2 (*)    All other components within normal limits  CBG MONITORING, ED - Abnormal; Notable for the following components:   Glucose-Capillary 128 (*)    All other components within normal limits  CBC WITH DIFFERENTIAL/PLATELET  PREGNANCY, URINE  BASIC METABOLIC PANEL WITH GFR  BASIC METABOLIC PANEL WITH GFR  BASIC METABOLIC PANEL WITH GFR  BETA-HYDROXYBUTYRIC ACID  BETA-HYDROXYBUTYRIC ACID  BETA-HYDROXYBUTYRIC ACID  URINALYSIS, ROUTINE W REFLEX MICROSCOPIC  BLOOD GAS, VENOUS  BASIC METABOLIC PANEL WITH GFR  BETA-HYDROXYBUTYRIC ACID    EKG None  Radiology No results found.  Procedures Procedures    Medications Ordered in ED Medications  lactated ringers  bolus 1,000 mL (0 mLs Intravenous Stopped 08/07/23 1849)    ED Course/ Medical Decision Making/ A&P                                 Medical Decision Making Amount and/or Complexity of Data Reviewed Labs: ordered.   This patient presents to the ED with  concern for excessive thirst, elevated blood sugar. This involves an extensive number of treatment options, and is a complaint that carries with it a high risk of complications and morbidity.  The differential diagnosis includes hyperglycemia versus diabetic ketoacidosis  versus ketosis versus other  Co-morbidities that complicate the patient evaluation: History of significant insulin  resistant diabetes   I ordered and personally interpreted labs.  The pertinent results include: Labs showing initial hyperglycemia, mild anion gap, beta hydroxy level negative, no acidosis on VBG   I ordered medication including IV fluid bolus for hydration  I have reviewed the patients home medicines and have made adjustments as needed  Test Considered: No indication for CT imaging of the chest abdomen pelvis  After the interventions noted above, I reevaluated the patient and found that they have: improved -her blood sugar normalized here in the ED.  She was given some dinner and sugar rechecked, which was stable.  She has chronic labile blood sugars, unfortunately, given her insulin  resistance.  She will continue to monitor it at home, but at this time there is no indication for hospitalization for hyperglycemic emergency.  Patient is comfortable going home.  Dispostion:  After consideration of the diagnostic results and the patients response to treatment, I feel that the patent would benefit from close outpatient follow-up.         Final Clinical Impression(s) / ED Diagnoses Final diagnoses:  Hyperglycemia    Rx / DC Orders ED Discharge Orders     None         Sem Mccaughey, Janalyn Me, MD 08/07/23 2310

## 2023-08-07 NOTE — Discharge Instructions (Signed)
 Take your normal evening insulin  at home and continue to eat regularly at home.  Monitor your blood sugar at home.

## 2023-08-08 ENCOUNTER — Ambulatory Visit (HOSPITAL_COMMUNITY): Payer: Self-pay | Admitting: Family Medicine

## 2023-08-14 ENCOUNTER — Encounter: Payer: Self-pay | Admitting: Physician Assistant

## 2023-08-14 ENCOUNTER — Other Ambulatory Visit (INDEPENDENT_AMBULATORY_CARE_PROVIDER_SITE_OTHER)

## 2023-08-14 ENCOUNTER — Ambulatory Visit: Admitting: Physician Assistant

## 2023-08-14 VITALS — BP 122/64 | HR 94 | Ht <= 58 in | Wt 149.0 lb

## 2023-08-14 DIAGNOSIS — D136 Benign neoplasm of pancreas: Secondary | ICD-10-CM

## 2023-08-14 DIAGNOSIS — D509 Iron deficiency anemia, unspecified: Secondary | ICD-10-CM

## 2023-08-14 DIAGNOSIS — K760 Fatty (change of) liver, not elsewhere classified: Secondary | ICD-10-CM

## 2023-08-14 DIAGNOSIS — D649 Anemia, unspecified: Secondary | ICD-10-CM

## 2023-08-14 DIAGNOSIS — K861 Other chronic pancreatitis: Secondary | ICD-10-CM | POA: Diagnosis not present

## 2023-08-14 DIAGNOSIS — G8929 Other chronic pain: Secondary | ICD-10-CM

## 2023-08-14 DIAGNOSIS — I272 Pulmonary hypertension, unspecified: Secondary | ICD-10-CM

## 2023-08-14 DIAGNOSIS — R161 Splenomegaly, not elsewhere classified: Secondary | ICD-10-CM

## 2023-08-14 DIAGNOSIS — K3184 Gastroparesis: Secondary | ICD-10-CM

## 2023-08-14 DIAGNOSIS — R1013 Epigastric pain: Secondary | ICD-10-CM

## 2023-08-14 DIAGNOSIS — E1143 Type 2 diabetes mellitus with diabetic autonomic (poly)neuropathy: Secondary | ICD-10-CM

## 2023-08-14 DIAGNOSIS — K638219 Small intestinal bacterial overgrowth, unspecified: Secondary | ICD-10-CM

## 2023-08-14 DIAGNOSIS — I5043 Acute on chronic combined systolic (congestive) and diastolic (congestive) heart failure: Secondary | ICD-10-CM

## 2023-08-14 DIAGNOSIS — Z794 Long term (current) use of insulin: Secondary | ICD-10-CM

## 2023-08-14 DIAGNOSIS — I5020 Unspecified systolic (congestive) heart failure: Secondary | ICD-10-CM

## 2023-08-14 LAB — CBC WITH DIFFERENTIAL/PLATELET
Basophils Absolute: 0.1 10*3/uL (ref 0.0–0.1)
Basophils Relative: 2.4 % (ref 0.0–3.0)
Eosinophils Absolute: 0.1 10*3/uL (ref 0.0–0.7)
Eosinophils Relative: 2.4 % (ref 0.0–5.0)
HCT: 36.2 % (ref 36.0–46.0)
Hemoglobin: 12.3 g/dL (ref 12.0–15.0)
Lymphocytes Relative: 45.5 % (ref 12.0–46.0)
Lymphs Abs: 2.2 10*3/uL (ref 0.7–4.0)
MCHC: 33.9 g/dL (ref 30.0–36.0)
MCV: 88.8 fl (ref 78.0–100.0)
Monocytes Absolute: 0.4 10*3/uL (ref 0.1–1.0)
Monocytes Relative: 8.8 % (ref 3.0–12.0)
Neutro Abs: 2 10*3/uL (ref 1.4–7.7)
Neutrophils Relative %: 40.9 % — ABNORMAL LOW (ref 43.0–77.0)
Platelets: 144 10*3/uL — ABNORMAL LOW (ref 150.0–400.0)
RBC: 4.08 Mil/uL (ref 3.87–5.11)
RDW: 14.6 % (ref 11.5–15.5)
WBC: 4.9 10*3/uL (ref 4.0–10.5)

## 2023-08-14 LAB — BASIC METABOLIC PANEL WITH GFR
BUN: 40 mg/dL — ABNORMAL HIGH (ref 6–23)
CO2: 26 meq/L (ref 19–32)
Calcium: 9.8 mg/dL (ref 8.4–10.5)
Chloride: 99 meq/L (ref 96–112)
Creatinine, Ser: 1.94 mg/dL — ABNORMAL HIGH (ref 0.40–1.20)
GFR: 33.7 mL/min — ABNORMAL LOW (ref >60.00)
Glucose, Bld: 93 mg/dL (ref 70–99)
Potassium: 3.7 meq/L (ref 3.5–5.1)
Sodium: 136 meq/L (ref 135–145)

## 2023-08-14 LAB — HEPATIC FUNCTION PANEL
ALT: 128 U/L — ABNORMAL HIGH (ref 0–35)
AST: 120 U/L — ABNORMAL HIGH (ref 0–37)
Albumin: 4.5 g/dL (ref 3.5–5.2)
Alkaline Phosphatase: 96 U/L (ref 39–117)
Bilirubin, Direct: 0.1 mg/dL (ref 0.0–0.3)
Total Bilirubin: 0.6 mg/dL (ref 0.2–1.2)
Total Protein: 7.8 g/dL (ref 6.0–8.3)

## 2023-08-14 LAB — PROTIME-INR
INR: 1.1 ratio — ABNORMAL HIGH (ref 0.8–1.0)
Prothrombin Time: 11.2 s (ref 9.6–13.1)

## 2023-08-14 LAB — GAMMA GT: GGT: 853 U/L — ABNORMAL HIGH (ref 7–51)

## 2023-08-14 MED ORDER — PROMETHAZINE HCL 25 MG PO TABS: 25.0000 mg | ORAL_TABLET | Freq: Four times a day (QID) | ORAL | 2 refills | Status: DC | PRN

## 2023-08-14 NOTE — Progress Notes (Signed)
 08/14/2023 Jennifer [name] [id number] [birth year]  Referring provider: Calton Catholic, PA-C Primary GI doctor: Dr. Rosaline Coma  ASSESSMENT AND PLAN:  Chronic pancreatitis/IPMN CT and MRCP did not show any chronic pancreatitis changes did show IPMN CT abdomen pelvis with contrast 03/05/2021 showed no acute findings showed normal liver gallbladder biliary tree normal pancreas normal spleen normal stomach and small bowel mild contrast and fecal retention throughout the colon stable cardiomegaly MRI/MRCP 07/25/23 showed no significant change in punctate 6mm lesion pancreatic tail potentially IPMN, hepatic steatosis unchanged splenomegaly possible iron deposition in the spleen On Creon  Recall MRI 12 months for 5 years then 2 years 4 years stop if stable for minimum of 9-please  recall placed for 07/24/2024  Epigastric pain Likely gastroparesis, rule out ascites with AB US  Consider repeat EGD, last done 2023 unremarkable Continue protonix  Given gastroparesis diet  IDA normocytic since 2020 thought secondary to chronic inflammation or acute on chronic disease with CKD 08/07/2023  HGB 11.2 MCV 89.7 Platelets 182 06/02/2022 Iron 66 Ferritin 343 B12 1,521 Recent Labs    05/10/23 1827 06/18/23 0244 06/21/23 1704 06/22/23 1406 06/24/23 2127 06/28/23 1933 06/30/23 1704 07/02/23 1643 08/07/23 1724 08/07/23 1759  HGB 10.5* 10.1* 10.9* 11.2* 11.4* 12.4 13.0 13.2 12.8 11.2*  Normocytic anemia since 2020 status post iron infusions follows with hematology [facility] Last Injectafer 03/27/2023 and 04/03/2023 Has menorrhagia and frequent episodes of epistaxis - recheck iron/ferritin - consider EGD colon if low iron - follow up GYN  Gastroparesis 07/2020 GES delayed -Controlled with diet, reglan  as needed - given information  SIBO Has been treated in the past with doxycycline  Flagyl  which she did have response, Xifaxan  not covered  -Doing well at this time  HFrEF  10/04/2022 echocardiogram  ejection fraction 50 to 55% elevated left ventricular end-diastolic pressures normal RVSP mild MR mild AR no aortic stenosis Follows with Dr. Julane Ny Pending repeat echocardiogram which is ordered but not yet scheduled  MAFLD with splenomegaly seen on recent MRCP remote history of thrombocytopenia thought secondary to antibiotics has resolved With type 2 diabetes hypertriglyceridemia Some suspicion for cardiac as well contributing 02/2021 alpha-1 antitrypsin negative IgG negative anti-smooth muscle canceled ceruloplasmin very low at less than 2 was referred for ophthalmology and 24-hour urine copper , I do not see these results - will repeat labs, get AB US  - get 24 hour urine copper /referral to Dr. Candi Chafe pending results  Patient Care Team: Mendel Stain as PCP - General (Physician Assistant) Tasia Farr, MD as PCP - Endocrinology (Endocrinology) Knox Perl, MD as PCP - Cardiology (Cardiology)  HISTORY OF PRESENT ILLNESS: 33 y.o. female with a past medical history listed below presents for evaluation of Ab pain.   Patient last seen by Dr. Rosaline Coma 02/2021 was a new patient's she then transferred her care to [facility] gastroenterology but they are too far away. Patient was last seen also in the hospital 05/09/2023 by our team for abdominal pain CT renal stone unremarkable, hyperkalemia, treated with Xifaxan  patient had moderate stool throughout colon  Discussed the use of AI scribe software for clinical note transcription with the patient, who gave verbal consent to proceed.  History of Present Illness   Jennifer [name] is a 33 year old female with gastroparesis and iron deficiency anemia who presents with abdominal pain and nausea.  She experiences ongoing abdominal pain and nausea, primarily attributing these symptoms to her gastroparesis. The discomfort occurs after eating, particularly with meats and salads. She manages her condition through dietary modifications,  avoiding  medications due to potential side effects. She occasionally uses Reglan  for nausea but primarily relies on promethazine , taken once daily at night.  She has a history of iron deficiency anemia and continues to experience heavy menstrual periods despite receiving iron injections. Her last iron infusion was in March 2025.  In April 2025, an MRCP revealed a 6 mm pancreatic tail cyst, fatty liver, and spleen enlargement. A previous EUS in 2023 showed the presence of the cyst.  She has experienced small intestinal bacterial overgrowth, treated with doxycycline  and metronidazole , as her insurance did not cover Xifaxan . She did not repeat the test but assumed improvement based on symptom resolution.  She reports consistent issues with food and pills getting caught in her esophagus, particularly if not cut small enough, causing discomfort in the upper esophagus. Her bowel movements are typically loose, which is a change from her usual pattern.  She has a history of low ceruloplasmin levels but has not completed the recommended follow-up tests, such as a 24-hour urine collection or an eye examination for copper  rings. She sees an eye doctor regularly for other issues, receiving shots in her eyes.        She  reports that she has never smoked. She has never used smokeless tobacco. She reports that she does not drink alcohol  and does not use drugs.  RELEVANT GI HISTORY, IMAGING AND LABS: Results   LABS Ceruloplasmin: Low  RADIOLOGY MRCP: 6 mm pancreatic tail cyst, fatty liver, spleen enlargement (07/25/2023)  DIAGNOSTIC EUS: No chronic pancreatitis, pancreatic tail cyst (2023) Gastroparesis study: Abnormal (2026)     Upper EUS 05/2021 Impression: EGD IMPRESSIONS: - Normal esophagus. - Normal stomach. Biopsied. - Normal examined duodenum. Biopsied. EUS IMPRESSIONS: - A cystic lesion was seen in the pancreatic body.  Small side branched cyst versus dilated side branch.  Correlate with MRCP. -  No changes of chronic pancreatitis. - The pancreatic duct had a normal endosonographic  appearance in the pancreatic head, neck of the  pancreas and body of the pancreas. The duct in the  tail was not well visualized. - Trace ascites was found on endosonographic  examination of the peritoneal cavity. - The left lobe of the liver revealed no mass or  lesion. - The region of the celiac artery was normal.   Gallbladder U/S 07/22/20: IMPRESSION:  1.  Borderline hepatic enlargement with steatosis.  2.  Additional findings as detailed above.    GES 08/25/20: FINDINGS: Activity is seen in the stomach on the immediate images and there is delayed washout of activity into the bowel over time. Gastric emptying is 53% at 4 hours IMPRESSION:  Delayed gastric emptying scan.    CT A/P w/contrast 11/18/20: Impression:  1.  Infiltrate or edema in the lung bases. Stable cardiomegaly.  2.  Hepatosplenomegaly. Hepatic steatosis.  3.  Normal CT appearance of the pancreas. However, recommend clinical correlation with amylase and lipase levels for evaluation of acute pancreatitis as clinically reported.    CT A/P w/contrast 11/22/20: IMPRESSION: 1. No CT findings to suggest acute pancreatitis. 2. Interval increase in hepatomegaly. 3. Hepatic steatosis. 4. Interval development of mild splenomegaly. 5. Atrophic uterus. 6. Similar-appearing mild circumferential hypodensity along the abdominal aorta and bilateral common iliac arteries. Finding may represent intimal thickening in the setting possible vasculitis.   CTA A/P w/contrast 11/24/20: IMPRESSION: VASCULAR  1. No evidence of acute or chronic mesenteric ischemia. 2. Mild distal aortic fibrofatty atherosclerotic plaque. Aortic Atherosclerosis (ICD10-I70.0). 3. Focal fibrofatty atherosclerotic plaque  results in mild stenosis and a small penetrating atherosclerotic ulceration in the left common iliac artery.  NON-VASCULAR  1. Hepatomegaly with  advanced hepatic steatosis. 2. Mild dependent atelectasis   CTA PE 01/25/21: IMPRESSION: No acute pulmonary embolism.  Cardiomegaly with left ventricular dilation.  Fluid-filled esophagus suggesting gastroesophageal reflux or esophageal dysmotility.  Moderate hepatic steatosis. Suspected hepatomegaly, incompletely evaluated on this examination.   EGD 01/08/20: Findings:  All observed locations appeared normal, including the esophagus, stomach  and duodenum. The esophagus, stomach and duodenum appeared normal.  Biopsies obtained of the antrum, incisura, lesser/greater curvature to  assess for H. Pylori.  Biopsies obtained of the duodenal bulb and descending duodenum to assess  for celiac disease.   CBC    Component Value Date/Time   WBC 6.4 08/07/2023 1724   RBC 4.09 08/07/2023 1724   HGB 11.2 (L) 08/07/2023 1759   HCT 33.0 (L) 08/07/2023 1759   PLT 182 08/07/2023 1724   MCV 89.7 08/07/2023 1724   MCH 31.3 08/07/2023 1724   MCHC 34.9 08/07/2023 1724   RDW 14.1 08/07/2023 1724   LYMPHSABS 2.3 08/07/2023 1724   MONOABS 0.5 08/07/2023 1724   EOSABS 0.1 08/07/2023 1724   BASOSABS 0.0 08/07/2023 1724   Recent Labs    05/10/23 1827 06/18/23 0244 06/21/23 1704 06/22/23 1406 06/24/23 2127 06/28/23 1933 06/30/23 1704 07/02/23 1643 08/07/23 1724 08/07/23 1759  HGB 10.5* 10.1* 10.9* 11.2* 11.4* 12.4 13.0 13.2 12.8 11.2*    CMP     Component Value Date/Time   NA 134 (L) 08/07/2023 1919   NA 132 (L) 11/19/2021 1058   K 3.6 08/07/2023 1919   CL 96 (L) 08/07/2023 1919   CO2 23 08/07/2023 1919   GLUCOSE 80 08/07/2023 1919   BUN 53 (H) 08/07/2023 1919   BUN 102 (HH) 11/19/2021 1058   CREATININE 2.36 (H) 08/07/2023 1919   CALCIUM  10.7 (H) 08/07/2023 1919   PROT 8.1 06/30/2023 1704   ALBUMIN  4.3 06/30/2023 1704   AST 181 (H) 06/30/2023 1704   ALT 134 (H) 06/30/2023 1704   ALKPHOS 119 06/30/2023 1704   BILITOT 0.7 06/30/2023 1704   GFRNONAA 27 (L) 08/07/2023 1919    GFRAA 68 08/22/2019 0914      Latest Ref Rng & Units 06/30/2023    5:04 PM 06/28/2023    7:33 PM 06/24/2023    9:27 PM  Hepatic Function  Total Protein 6.5 - 8.1 g/dL 8.1  8.1  7.4   Albumin  3.5 - 5.0 g/dL 4.3  4.1  4.4   AST 15 - 41 U/L 181  92  68   ALT 0 - 44 U/L 134  114  86   Alk Phosphatase 38 - 126 U/L 119  120  107   Total Bilirubin 0.0 - 1.2 mg/dL 0.7  1.0  0.7       Current Medications:   Current Outpatient Medications (Endocrine & Metabolic):    BLISOVI  FE 1.5/30 1.5-30 MG-MCG tablet, Take 1 tablet by mouth daily.   insulin  lispro (HUMALOG ) 100 UNIT/ML injection, Inject 30-40 Units into the skin See admin instructions. Inject subcutaneously 40 units with breakfast, 30 units with lunch and 30 units with dinner   insulin  regular human CONCENTRATED (HUMULIN  R U-500 KWIKPEN) 500 UNIT/ML KwikPen, Inject 185 Units into the skin 3 (three) times daily with meals. (Patient taking differently: Inject 195 Units into the skin 2 (two) times daily with a meal. Inject subcutaneously 195 units with breakfast and lunch)   levothyroxine  (  SYNTHROID ) 137 MCG tablet, Take 137 mcg by mouth daily.  Current Outpatient Medications (Cardiovascular):    amLODipine  (NORVASC ) 5 MG tablet, Take 1 tablet (5 mg total) by mouth daily.   carvedilol  (COREG ) 6.25 MG tablet, Take 1.5 tablets (9.375 mg total) by mouth 2 (two) times daily.   Evolocumab  (REPATHA ) 140 MG/ML SOSY, Inject 140 mg into the skin every 14 (fourteen) days. Saturdays   fenofibrate  54 MG tablet, Take 1 tablet (54 mg total) by mouth daily. (Patient taking differently: Take 54 mg by mouth at bedtime.)   hydrALAZINE  (APRESOLINE ) 50 MG tablet, Take 1.5 tablets (75 mg total) by mouth 3 (three) times daily.   ivabradine  (CORLANOR ) 7.5 MG TABS tablet, Take 1 tablet (7.5 mg total) by mouth 2 (two) times daily with a meal. (Patient taking differently: Take 7.5 mg by mouth at bedtime.)   rosuvastatin  (CRESTOR ) 40 MG tablet, Take 40 mg by mouth daily.    torsemide  (DEMADEX ) 20 MG tablet, Take 3 tablets (60 mg total) by mouth daily.  Current Outpatient Medications (Respiratory):    azelastine  (ASTELIN ) 0.1 % nasal spray, Place 2 sprays into both nostrils daily at 6 (six) AM.   levocetirizine (XYZAL ) 5 MG tablet, Take 5 mg by mouth daily.   promethazine  (PHENERGAN ) 25 MG tablet, Take 1 tablet (25 mg total) by mouth every 6 (six) hours as needed for nausea or vomiting.  Current Outpatient Medications (Analgesics):    aspirin  EC 81 MG EC tablet, Take 1 tablet (81 mg total) by mouth daily. Swallow whole.   butalbital -acetaminophen -caffeine  (FIORICET ) 50-325-40 MG tablet, Take 1 tablet by mouth daily as needed for headache.   SUMAtriptan  (IMITREX ) 25 MG tablet, Take 25 mg by mouth every 2 (two) hours as needed.   Current Outpatient Medications (Other):    amitriptyline  (ELAVIL ) 10 MG tablet, Take 10 mg by mouth at bedtime.   CRANBERRY CONCENTRATE PO, Take 2 tablets by mouth 2 (two) times daily.   CREON  36000-114000 units CPEP capsule, Take 1 capsule (36,000 Units total) by mouth 3 (three) times daily.   DULoxetine  (CYMBALTA ) 60 MG capsule, Take 60 mg by mouth at bedtime.   ergocalciferol  (VITAMIN D2) 1.25 MG (50000 UT) capsule, Take 50,000 Units by mouth every 7 (seven) days. Every Sunday   Insulin  Pen Needle 32G X 4 MM MISC, 1 Device by Does not apply route QID. For use with insulin  pens   metoCLOPramide  (REGLAN ) 5 MG tablet, Take 1 tablet (5 mg total) by mouth every 8 (eight) hours as needed for nausea or vomiting.   pantoprazole  (PROTONIX ) 40 MG tablet, Take 40 mg by mouth daily.   polyethylene glycol (MIRALAX  / GLYCOLAX ) 17 g packet, Take 17 g by mouth daily as needed for mild constipation. Also available OTC   pregabalin  (LYRICA ) 300 MG capsule, Take 300 mg by mouth 2 (two) times daily.   prochlorperazine  (COMPAZINE ) 10 MG tablet, Take 10 mg by mouth at bedtime.   Safety Seal Miscellaneous MISC, Hormonic Hair Solution with minoxidil USP 7%  and finasteride USP 0.05% - apply to affected areas daily every morning.   sodium bicarbonate  650 MG tablet, Take 2 tablets (1,300 mg total) by mouth 2 (two) times daily.   tamsulosin  (FLOMAX ) 0.4 MG CAPS capsule, Take 1 capsule (0.4 mg total) by mouth daily.   topiramate  (TOPAMAX ) 50 MG tablet, Take 50 mg by mouth 2 (two) times daily.   tretinoin  (RETIN-A ) 0.05 % cream, Apply 1 application topically at bedtime.   sucralfate  (CARAFATE ) 1 g  tablet, Take 1 tablet (1 g total) by mouth with breakfast, with lunch, and with evening meal for 7 days.  Medical History:  Past Medical History:  Diagnosis Date   Afib (HCC) 05/12/2021   Brain tumor (HCC) 03/29/1995   astrocytoma   CHF (congestive heart failure) (HCC)    Cholesterosis    CKD (chronic kidney disease) stage 4, GFR 15-29 ml/min (HCC) 05/13/2021   DM (diabetes mellitus) (HCC) 10/10/2018   Fatty liver    HTN (hypertension) 10/10/2018   Hypothyroidism 10/10/2018   Lipoprotein deficiency    Lung disease    longevity long term   Pancreatitis    Polycystic ovary syndrome    Allergies:  Allergies  Allergen Reactions   Icosapent  Ethyl (Epa Ethyl Ester) (Fish)     Hives w/Vascepa    Ketamine Other (See Comments)    In vegetative state for 15 minutes per pt   [place] Itching    Itchy throat  Itchy throat Itchy throat Itchy throat Itchy throat    Itchy throat  Itchy throat Itchy throat Itchy throat Itchy throat    Itchy throat   Morphine  Itching, Rash and Hives    Sorethroat   Penicillins Hives, Itching and Rash    Has patient had a PCN reaction causing immediate rash, facial/tongue/throat swelling, SOB or lightheadedness with hypotension: Y Has patient had a PCN reaction causing severe rash involving mucus membranes or skin necrosis: Y Has patient had a PCN reaction that required hospitalization: N Has patient had a PCN reaction occurring within the last 10 years: Y   Clarithromycin Other (See Comments)    Stomach pain    Erythromycin      Stomach pain   Fentanyl  Nausea And Vomiting   Penicillin V Hives, Itching and Other (See Comments)    Gi upset, also   Shellfish Allergy Other (See Comments)    Pt has never had shellfish but tested positive on allergy test. Pt states contrast in CT is okay   Prednisone Rash     Surgical History:  She  has a past surgical history that includes Brain surgery; Ventriculostomy (03/28/1997); Eye muscle surgery (Right, 03/28/2014); Abdominal surgery; RIGHT/LEFT HEART CATH AND CORONARY ANGIOGRAPHY (N/A, 12/03/2020); RIGHT HEART CATH (N/A, 08/05/2021); PRESSURE SENSOR/CARDIOMEMS (N/A, 12/06/2021); and RIGHT HEART CATH (N/A, 12/06/2021). Family History:  Her family history includes Diabetes in her father and mother; Hyperlipidemia in her mother; Hypertension in her father and mother; Pancreatic cancer in her paternal aunt and paternal uncle; Thyroid  disease in her mother.  REVIEW OF SYSTEMS  : All other systems reviewed and negative except where noted in the History of Present Illness.  PHYSICAL EXAM: BP 122/64   Pulse 94   Ht 4\' 9"  (1.448 m)   Wt 149 lb (67.6 kg)   BMI 32.24 kg/m  Physical Exam   GENERAL APPEARANCE: Well nourished, in no apparent distress. HEENT: No cervical lymphadenopathy, unremarkable thyroid , sclerae anicteric, conjunctiva pink. RESPIRATORY: Respiratory effort normal, breath sounds equal bilaterally without rales, rhonchi, or wheezing. CARDIO: Regular rate and rhythm with no murmurs, rubs, or gallops, peripheral pulses intact. ABDOMEN: Abdomen protuberant but soft, upper abdomen slightly firm but still soft, decreased bowel sounds, dullness to percussion, epigastric pain on palpation. RECTAL: Declines. MUSCULOSKELETAL: Full range of motion, normal gait, without edema. SKIN: Dry, intact without rashes or lesions. No jaundice. NEURO: Alert, oriented, no focal deficits. PSYCH: Cooperative, normal mood and affect. EXTREMITIES: No swelling in legs.       Jennifer Gottron, PA-C 11:10 AM

## 2023-08-14 NOTE — Progress Notes (Signed)
 I agree with the assessment and plan as outlined by Ms. Steffanie Dunn.

## 2023-08-14 NOTE — Patient Instructions (Signed)
 Your provider has requested that you go to the basement level for lab work before leaving today. Press "B" on the elevator. The lab is located at the first door on the left as you exit the elevator.   Reglan  or metoclopramide   Can be used for gastroparesis or slow stomach, nausea, vomiting, GERD.   Continue the medication as needed up to 3 times a day, on an empty stomach 30 minutes before eating. It may take a few weeks for your stomach condition to start to get better. However, do not take this medicine for longer than 12 weeks.  The longer you take this medicine, and the more you take it, the greater your chances are of developing serious side effects.  Some people may get a severe muscle problem called tardive dyskinesia. This problem may lessen or go away after stopping this drug, but it may not go away. The risk is greater with diabetes and in older adults, especially older females. The risk is greater with longer use or higher doses, but it may also occur after short-term use with low doses. Call your doctor right away if you have trouble controlling body movements or problems with your tongue, face, mouth, or jaw like tongue sticking out, puffing cheeks, mouth puckering, or chewing.   Please monitor for worsening depression or thoughts of suicide, any aggressiveness or hyperactivity.  If this happen please stop and call your physician right away.    Gastroparesis Please do small frequent meals like 4-6 meals a day.  Eat and drink liquids at separate times.  Avoid high fiber foods, cook your vegetables, avoid high fat food.  Suggest spreading protein throughout the day (greek yogurt, glucerna, soft meat, milk, eggs) Choose soft foods that you can mash with a fork When you are more symptomatic, change to pureed foods foods and liquids.  Consider reading "Living well with Gastroparesis" by Creasie Doctor Gastroparesis is a condition in which food takes longer than normal to empty  from the stomach. This condition is also known as delayed gastric emptying. It is usually a long-term (chronic) condition. There is no cure, but there are treatments and things that you can do at home to help relieve symptoms. Treating the underlying condition that causes gastroparesis can also help relieve symptoms What are the causes? In many cases, the cause of this condition is not known. Possible causes include: A hormone (endocrine) disorder, such as hypothyroidism or diabetes. A nervous system disease, such as Parkinson's disease or multiple sclerosis. Cancer, infection, or surgery that affects the stomach or vagus nerve. The vagus nerve runs from your chest, through your neck, and to the lower part of your brain. A connective tissue disorder, such as scleroderma. Certain medicines. What increases the risk? You are more likely to develop this condition if: You have certain disorders or diseases. These may include: An endocrine disorder. An eating disorder. Amyloidosis. Scleroderma. Parkinson's disease. Multiple sclerosis. Cancer or infection of the stomach or the vagus nerve. You have had surgery on your stomach or vagus nerve. You take certain medicines. You are female. What are the signs or symptoms? Symptoms of this condition include: Feeling full after eating very little or a loss of appetite. Nausea, vomiting, or heartburn. Bloating of your abdomen. Inconsistent blood sugar (glucose) levels on blood tests. Unexplained weight loss. Acid from the stomach coming up into the esophagus (gastroesophageal reflux). Sudden tightening (spasm) of the stomach, which can be painful. Symptoms may come and go. Some people may not notice  any symptoms. How is this diagnosed? This condition is diagnosed with tests, such as: Tests that check how long it takes food to move through the stomach and intestines. These tests include: Upper gastrointestinal (GI) series. For this test, you drink  a liquid that shows up well on X-rays, and then X-rays are taken of your intestines. Gastric emptying scintigraphy. For this test, you eat food that contains a small amount of radioactive material, and then scans are taken. Wireless capsule GI monitoring system. For this test, you swallow a pill (capsule) that records information about how foods and fluid move through your stomach. Gastric manometry. For this test, a tube is passed down your throat and into your stomach to measure electrical and muscular activity. Endoscopy. For this test, a long, thin tube with a camera and light on the end is passed down your throat and into your stomach to check for problems in your stomach lining. Ultrasound. This test uses sound waves to create images of the inside of your body. This can help rule out gallbladder disease or pancreatitis as a cause of your symptoms. How is this treated? There is no cure for this condition, but treatment and home care may relieve symptoms. Treatment may include: Treating the underlying cause. Managing your symptoms by making changes to your diet and exercise habits. Taking medicines to control nausea and vomiting and to stimulate stomach muscles. Getting food through a feeding tube in the hospital. This may be done in severe cases. Having surgery to insert a device called a gastric electrical stimulator into your body. This device helps improve stomach emptying and control nausea and vomiting. Follow these instructions at home: Take over-the-counter and prescription medicines only as told by your health care provider. Follow instructions from your health care provider about eating or drinking restrictions. Your health care provider may recommend that you: Eat smaller meals more often. Eat low-fat foods. Eat low-fiber forms of high-fiber foods. For example, eat cooked vegetables instead of raw vegetables. Have only liquid foods instead of solid foods. Liquid foods are easier to  digest. Drink enough fluid to keep your urine pale yellow. Exercise as often as told by your health care provider. Keep all follow-up visits. This is important. Contact a health care provider if you: Notice that your symptoms do not improve with treatment. Have new symptoms. Get help right away if you: Have severe pain in your abdomen that does not improve with treatment. Have nausea that is severe or does not go away. Vomit every time you drink fluids. Summary Gastroparesis is a long-term (chronic) condition in which food takes longer than normal to empty from the stomach. Symptoms include nausea, vomiting, heartburn, bloating of your abdomen, and loss of appetite. Eating smaller portions, low-fat foods, and low-fiber forms of high-fiber foods may help you manage your symptoms. Get help right away if you have severe pain in your abdomen. This information is not intended to replace advice given to you by your health care provider. Make sure you discuss any questions you have with your health care provider. Document Revised: 07/22/2019 Document Reviewed: 07/22/2019 Elsevier Patient Education  2021 [employer]  You have been scheduled for an abdominal ultrasound at [facility] (1st floor of hospital) on 08/15/2023 at 7:00 am Please arrive 30 minutes prior to your appointment for registration. Make certain not to have anything to eat or drink 6 hours prior to your appointment. Should you need to reschedule your appointment, please contact radiology at [phone]. This  test typically takes about 30 minutes to perform.   Due to recent changes in healthcare laws, you may see the results of your imaging and laboratory studies on MyChart before your provider has had a chance to review them.  We understand that in some cases there may be results that are confusing or concerning to you. Not all laboratory results come back in the same time frame and the provider may be waiting for multiple  results in order to interpret others.  Please give us  48 hours in order for your provider to thoroughly review all the results before contacting the office for clarification of your results.    I appreciate the  opportunity to care for you  Thank You   [facility]

## 2023-08-14 NOTE — Telephone Encounter (Signed)
 Patient was all ready seen back in the office, unable to go to [facility] due to how far away they are.

## 2023-08-15 ENCOUNTER — Other Ambulatory Visit: Payer: Self-pay

## 2023-08-15 ENCOUNTER — Inpatient Hospital Stay (HOSPITAL_COMMUNITY)
Admission: EM | Admit: 2023-08-15 | Discharge: 2023-08-26 | DRG: 438 | Disposition: A | Discharge status: Home Health | Attending: Internal Medicine | Admitting: Internal Medicine

## 2023-08-15 ENCOUNTER — Observation Stay (HOSPITAL_COMMUNITY)

## 2023-08-15 ENCOUNTER — Ambulatory Visit (HOSPITAL_COMMUNITY)

## 2023-08-15 ENCOUNTER — Encounter (HOSPITAL_COMMUNITY): Payer: Self-pay

## 2023-08-15 DIAGNOSIS — I130 Hypertensive heart and chronic kidney disease with heart failure and stage 1 through stage 4 chronic kidney disease, or unspecified chronic kidney disease: Secondary | ICD-10-CM | POA: Diagnosis present

## 2023-08-15 DIAGNOSIS — I428 Other cardiomyopathies: Secondary | ICD-10-CM | POA: Diagnosis present

## 2023-08-15 DIAGNOSIS — K858 Other acute pancreatitis without necrosis or infection: Secondary | ICD-10-CM | POA: Diagnosis not present

## 2023-08-15 DIAGNOSIS — I480 Paroxysmal atrial fibrillation: Secondary | ICD-10-CM | POA: Diagnosis present

## 2023-08-15 DIAGNOSIS — Z885 Allergy status to narcotic agent status: Secondary | ICD-10-CM

## 2023-08-15 DIAGNOSIS — I10 Essential (primary) hypertension: Secondary | ICD-10-CM | POA: Diagnosis present

## 2023-08-15 DIAGNOSIS — Z85841 Personal history of malignant neoplasm of brain: Secondary | ICD-10-CM

## 2023-08-15 DIAGNOSIS — I5022 Chronic systolic (congestive) heart failure: Secondary | ICD-10-CM | POA: Diagnosis present

## 2023-08-15 DIAGNOSIS — Z881 Allergy status to other antibiotic agents status: Secondary | ICD-10-CM

## 2023-08-15 DIAGNOSIS — E781 Pure hyperglyceridemia: Secondary | ICD-10-CM | POA: Diagnosis present

## 2023-08-15 DIAGNOSIS — E871 Hypo-osmolality and hyponatremia: Secondary | ICD-10-CM | POA: Diagnosis present

## 2023-08-15 DIAGNOSIS — E1142 Type 2 diabetes mellitus with diabetic polyneuropathy: Secondary | ICD-10-CM | POA: Diagnosis present

## 2023-08-15 DIAGNOSIS — R1084 Generalized abdominal pain: Secondary | ICD-10-CM | POA: Diagnosis not present

## 2023-08-15 DIAGNOSIS — R000 Tachycardia, unspecified: Secondary | ICD-10-CM | POA: Diagnosis present

## 2023-08-15 DIAGNOSIS — K7581 Nonalcoholic steatohepatitis (NASH): Secondary | ICD-10-CM | POA: Diagnosis present

## 2023-08-15 DIAGNOSIS — E7801 Familial hypercholesterolemia: Secondary | ICD-10-CM | POA: Diagnosis present

## 2023-08-15 DIAGNOSIS — Z833 Family history of diabetes mellitus: Secondary | ICD-10-CM

## 2023-08-15 DIAGNOSIS — Z8349 Family history of other endocrine, nutritional and metabolic diseases: Secondary | ICD-10-CM

## 2023-08-15 DIAGNOSIS — Z79899 Other long term (current) drug therapy: Secondary | ICD-10-CM

## 2023-08-15 DIAGNOSIS — I2510 Atherosclerotic heart disease of native coronary artery without angina pectoris: Secondary | ICD-10-CM | POA: Diagnosis present

## 2023-08-15 DIAGNOSIS — E872 Acidosis, unspecified: Secondary | ICD-10-CM | POA: Diagnosis present

## 2023-08-15 DIAGNOSIS — R0602 Shortness of breath: Secondary | ICD-10-CM

## 2023-08-15 DIAGNOSIS — E039 Hypothyroidism, unspecified: Secondary | ICD-10-CM | POA: Diagnosis present

## 2023-08-15 DIAGNOSIS — I5023 Acute on chronic systolic (congestive) heart failure: Secondary | ICD-10-CM | POA: Diagnosis present

## 2023-08-15 DIAGNOSIS — Z888 Allergy status to other drugs, medicaments and biological substances status: Secondary | ICD-10-CM

## 2023-08-15 DIAGNOSIS — I5021 Acute systolic (congestive) heart failure: Secondary | ICD-10-CM | POA: Diagnosis present

## 2023-08-15 DIAGNOSIS — Z794 Long term (current) use of insulin: Secondary | ICD-10-CM

## 2023-08-15 DIAGNOSIS — Z8249 Family history of ischemic heart disease and other diseases of the circulatory system: Secondary | ICD-10-CM

## 2023-08-15 DIAGNOSIS — N1832 Chronic kidney disease, stage 3b: Secondary | ICD-10-CM | POA: Diagnosis present

## 2023-08-15 DIAGNOSIS — I951 Orthostatic hypotension: Secondary | ICD-10-CM | POA: Diagnosis present

## 2023-08-15 DIAGNOSIS — K760 Fatty (change of) liver, not elsewhere classified: Secondary | ICD-10-CM | POA: Diagnosis present

## 2023-08-15 DIAGNOSIS — E669 Obesity, unspecified: Secondary | ICD-10-CM | POA: Diagnosis present

## 2023-08-15 DIAGNOSIS — E282 Polycystic ovarian syndrome: Secondary | ICD-10-CM | POA: Diagnosis present

## 2023-08-15 DIAGNOSIS — N179 Acute kidney failure, unspecified: Secondary | ICD-10-CM | POA: Diagnosis present

## 2023-08-15 DIAGNOSIS — Z800 Family history of malignant neoplasm of digestive organs: Secondary | ICD-10-CM

## 2023-08-15 DIAGNOSIS — E1165 Type 2 diabetes mellitus with hyperglycemia: Secondary | ICD-10-CM

## 2023-08-15 DIAGNOSIS — I272 Pulmonary hypertension, unspecified: Secondary | ICD-10-CM | POA: Diagnosis present

## 2023-08-15 DIAGNOSIS — K219 Gastro-esophageal reflux disease without esophagitis: Secondary | ICD-10-CM | POA: Diagnosis present

## 2023-08-15 DIAGNOSIS — Z6832 Body mass index (BMI) 32.0-32.9, adult: Secondary | ICD-10-CM

## 2023-08-15 DIAGNOSIS — K3184 Gastroparesis: Secondary | ICD-10-CM | POA: Diagnosis present

## 2023-08-15 DIAGNOSIS — Z880 Allergy status to penicillin: Secondary | ICD-10-CM

## 2023-08-15 DIAGNOSIS — I5033 Acute on chronic diastolic (congestive) heart failure: Secondary | ICD-10-CM | POA: Diagnosis present

## 2023-08-15 DIAGNOSIS — R011 Cardiac murmur, unspecified: Secondary | ICD-10-CM | POA: Diagnosis present

## 2023-08-15 DIAGNOSIS — Z83438 Family history of other disorder of lipoprotein metabolism and other lipidemia: Secondary | ICD-10-CM

## 2023-08-15 DIAGNOSIS — K861 Other chronic pancreatitis: Secondary | ICD-10-CM | POA: Diagnosis present

## 2023-08-15 DIAGNOSIS — Z91013 Allergy to seafood: Secondary | ICD-10-CM

## 2023-08-15 DIAGNOSIS — Z79890 Hormone replacement therapy (postmenopausal): Secondary | ICD-10-CM

## 2023-08-15 DIAGNOSIS — E1143 Type 2 diabetes mellitus with diabetic autonomic (poly)neuropathy: Secondary | ICD-10-CM | POA: Diagnosis present

## 2023-08-15 DIAGNOSIS — E88819 Insulin resistance, unspecified: Secondary | ICD-10-CM | POA: Diagnosis present

## 2023-08-15 DIAGNOSIS — E11649 Type 2 diabetes mellitus with hypoglycemia without coma: Secondary | ICD-10-CM | POA: Diagnosis not present

## 2023-08-15 DIAGNOSIS — N184 Chronic kidney disease, stage 4 (severe): Secondary | ICD-10-CM | POA: Diagnosis present

## 2023-08-15 DIAGNOSIS — E66811 Obesity, class 1: Secondary | ICD-10-CM | POA: Diagnosis present

## 2023-08-15 DIAGNOSIS — Z793 Long term (current) use of hormonal contraceptives: Secondary | ICD-10-CM

## 2023-08-15 DIAGNOSIS — R42 Dizziness and giddiness: Secondary | ICD-10-CM

## 2023-08-15 DIAGNOSIS — Z7982 Long term (current) use of aspirin: Secondary | ICD-10-CM

## 2023-08-15 DIAGNOSIS — R112 Nausea with vomiting, unspecified: Secondary | ICD-10-CM

## 2023-08-15 DIAGNOSIS — R0902 Hypoxemia: Secondary | ICD-10-CM | POA: Diagnosis present

## 2023-08-15 DIAGNOSIS — G4734 Idiopathic sleep related nonobstructive alveolar hypoventilation: Secondary | ICD-10-CM | POA: Diagnosis present

## 2023-08-15 DIAGNOSIS — E1169 Type 2 diabetes mellitus with other specified complication: Secondary | ICD-10-CM

## 2023-08-15 DIAGNOSIS — I959 Hypotension, unspecified: Secondary | ICD-10-CM | POA: Diagnosis present

## 2023-08-15 DIAGNOSIS — R188 Other ascites: Secondary | ICD-10-CM | POA: Diagnosis present

## 2023-08-15 DIAGNOSIS — G4733 Obstructive sleep apnea (adult) (pediatric): Secondary | ICD-10-CM | POA: Diagnosis present

## 2023-08-15 DIAGNOSIS — E875 Hyperkalemia: Secondary | ICD-10-CM | POA: Diagnosis present

## 2023-08-15 DIAGNOSIS — I5043 Acute on chronic combined systolic (congestive) and diastolic (congestive) heart failure: Secondary | ICD-10-CM | POA: Diagnosis present

## 2023-08-15 DIAGNOSIS — K859 Acute pancreatitis without necrosis or infection, unspecified: Secondary | ICD-10-CM | POA: Diagnosis present

## 2023-08-15 DIAGNOSIS — R109 Unspecified abdominal pain: Secondary | ICD-10-CM | POA: Diagnosis not present

## 2023-08-15 LAB — CBC
HCT: 37.1 % (ref 36.0–46.0)
Hemoglobin: 12.2 g/dL (ref 12.0–15.0)
MCH: 30.7 pg (ref 26.0–34.0)
MCHC: 32.9 g/dL (ref 30.0–36.0)
MCV: 93.2 fL (ref 80.0–100.0)
Platelets: 151 10*3/uL (ref 150–400)
RBC: 3.98 MIL/uL (ref 3.87–5.11)
RDW: 14.1 % (ref 11.5–15.5)
WBC: 6.9 10*3/uL (ref 4.0–10.5)
nRBC: 0 % (ref 0.0–0.2)

## 2023-08-15 LAB — COMPREHENSIVE METABOLIC PANEL WITH GFR
ALT: 117 U/L — ABNORMAL HIGH (ref 0–44)
AST: 87 U/L — ABNORMAL HIGH (ref 15–41)
Albumin: 4 g/dL (ref 3.5–5.0)
Alkaline Phosphatase: 98 U/L (ref 38–126)
Anion gap: 14 (ref 5–15)
BUN: 47 mg/dL — ABNORMAL HIGH (ref 6–20)
CO2: 23 mmol/L (ref 22–32)
Calcium: 9.4 mg/dL (ref 8.9–10.3)
Chloride: 99 mmol/L (ref 98–111)
Creatinine, Ser: 2.26 mg/dL — ABNORMAL HIGH (ref 0.44–1.00)
GFR, Estimated: 29 mL/min — ABNORMAL LOW (ref >60)
Glucose, Bld: 75 mg/dL (ref 70–99)
Potassium: 3.6 mmol/L (ref 3.5–5.1)
Sodium: 136 mmol/L (ref 135–145)
Total Bilirubin: 0.9 mg/dL (ref 0.0–1.2)
Total Protein: 7.6 g/dL (ref 6.5–8.1)

## 2023-08-15 LAB — IBC + FERRITIN
Ferritin: 1428.4 ng/mL — ABNORMAL HIGH (ref 10.0–291.0)
Iron: 133 ug/dL (ref 42–145)
Saturation Ratios: 34.8 % (ref 20.0–50.0)
TIBC: 382.2 ug/dL (ref 250.0–450.0)
Transferrin: 273 mg/dL (ref 212.0–360.0)

## 2023-08-15 LAB — TRIGLYCERIDES: Triglycerides: 2288 mg/dL — ABNORMAL HIGH (ref <150)

## 2023-08-15 LAB — GLUCOSE, CAPILLARY
Glucose-Capillary: 86 mg/dL (ref 70–99)
Glucose-Capillary: 95 mg/dL (ref 70–99)
Glucose-Capillary: 96 mg/dL (ref 70–99)
Glucose-Capillary: 72 mg/dL (ref 70–99)
Glucose-Capillary: 67 mg/dL — ABNORMAL LOW (ref 70–99)
Glucose-Capillary: 79 mg/dL (ref 70–99)
Glucose-Capillary: 83 mg/dL (ref 70–99)

## 2023-08-15 LAB — CBG MONITORING, ED: Glucose-Capillary: 70 mg/dL (ref 70–99)

## 2023-08-15 LAB — HCG, SERUM, QUALITATIVE: Preg, Serum: NEGATIVE

## 2023-08-15 LAB — LIPASE, BLOOD: Lipase: 26 U/L (ref 11–51)

## 2023-08-15 LAB — MRSA NEXT GEN BY PCR, NASAL: MRSA by PCR Next Gen: NOT DETECTED

## 2023-08-15 MED ORDER — ACETAMINOPHEN 325 MG PO TABS
650.0000 mg | ORAL_TABLET | Freq: Four times a day (QID) | ORAL | Status: DC | PRN
  Administered 2023-08-19 – 2023-08-26 (×4): 650 mg via ORAL
  Filled 2023-08-15 (×4): qty 2

## 2023-08-15 MED ORDER — CHLORHEXIDINE GLUCONATE CLOTH 2 % EX PADS
6.0000 | MEDICATED_PAD | Freq: Every day | CUTANEOUS | Status: DC
  Administered 2023-08-15 – 2023-08-16 (×2): 6 via TOPICAL

## 2023-08-15 MED ORDER — SODIUM CHLORIDE 0.9 % IV BOLUS
1000.0000 mL | Freq: Once | INTRAVENOUS | Status: AC
  Administered 2023-08-15: 1000 mL via INTRAVENOUS

## 2023-08-15 MED ORDER — ORAL CARE MOUTH RINSE: 15.0000 mL | OROMUCOSAL | Status: DC | PRN

## 2023-08-15 MED ORDER — FAMOTIDINE IN NACL 20-0.9 MG/50ML-% IV SOLN
20.0000 mg | Freq: Once | INTRAVENOUS | Status: AC
  Administered 2023-08-15: 20 mg via INTRAVENOUS
  Filled 2023-08-15: qty 50

## 2023-08-15 MED ORDER — HYDROMORPHONE HCL 1 MG/ML IJ SOLN
0.5000 mg | INTRAMUSCULAR | Status: DC | PRN
  Administered 2023-08-15 – 2023-08-25 (×56): 0.5 mg via INTRAVENOUS
  Filled 2023-08-15: qty 0.5
  Filled 2023-08-15: qty 1
  Filled 2023-08-15: qty 0.5
  Filled 2023-08-15: qty 1
  Filled 2023-08-15: qty 0.5
  Filled 2023-08-15 (×2): qty 1
  Filled 2023-08-15 (×2): qty 0.5
  Filled 2023-08-15: qty 1
  Filled 2023-08-15: qty 0.5
  Filled 2023-08-15 (×4): qty 1
  Filled 2023-08-15: qty 0.5
  Filled 2023-08-15: qty 1
  Filled 2023-08-15: qty 0.5
  Filled 2023-08-15 (×6): qty 1
  Filled 2023-08-15: qty 0.5
  Filled 2023-08-15 (×2): qty 1
  Filled 2023-08-15: qty 0.5
  Filled 2023-08-15: qty 1
  Filled 2023-08-15: qty 0.5
  Filled 2023-08-15: qty 1
  Filled 2023-08-15 (×3): qty 0.5
  Filled 2023-08-15: qty 1
  Filled 2023-08-15 (×2): qty 0.5
  Filled 2023-08-15: qty 1
  Filled 2023-08-15: qty 0.5
  Filled 2023-08-15 (×3): qty 1
  Filled 2023-08-15 (×2): qty 0.5
  Filled 2023-08-15 (×2): qty 1
  Filled 2023-08-15 (×2): qty 0.5
  Filled 2023-08-15 (×5): qty 1
  Filled 2023-08-15: qty 0.5
  Filled 2023-08-15: qty 1
  Filled 2023-08-15: qty 0.5
  Filled 2023-08-15 (×2): qty 1

## 2023-08-15 MED ORDER — HYDROMORPHONE HCL 1 MG/ML IJ SOLN
0.5000 mg | Freq: Once | INTRAMUSCULAR | Status: AC
  Administered 2023-08-15: 0.5 mg via INTRAVENOUS
  Filled 2023-08-15: qty 1

## 2023-08-15 MED ORDER — ACETAMINOPHEN 650 MG RE SUPP: 650.0000 mg | Freq: Four times a day (QID) | RECTAL | Status: DC | PRN

## 2023-08-15 MED ORDER — DEXTROSE 10 % IV SOLN: INTRAVENOUS | Status: AC

## 2023-08-15 MED ORDER — PROCHLORPERAZINE EDISYLATE 10 MG/2ML IJ SOLN
5.0000 mg | INTRAMUSCULAR | Status: DC | PRN
  Administered 2023-08-23 – 2023-08-25 (×2): 5 mg via INTRAVENOUS
  Filled 2023-08-15 (×2): qty 2

## 2023-08-15 MED ORDER — FAMOTIDINE IN NACL 20-0.9 MG/50ML-% IV SOLN
20.0000 mg | INTRAVENOUS | Status: DC
  Administered 2023-08-16 – 2023-08-20 (×5): 20 mg via INTRAVENOUS
  Filled 2023-08-15 (×6): qty 50

## 2023-08-15 MED ORDER — INSULIN (MYXREDLIN) INFUSION FOR HYPERTRIGLYCERIDEMIA
0.1000 [IU]/kg/h | INTRAVENOUS | Status: DC
  Administered 2023-08-15 – 2023-08-17 (×4): 0.1 [IU]/kg/h via INTRAVENOUS
  Filled 2023-08-15 (×5): qty 100

## 2023-08-15 MED ORDER — SODIUM CHLORIDE 0.9 % IV SOLN: INTRAVENOUS | Status: DC

## 2023-08-15 MED ORDER — DIPHENHYDRAMINE HCL 50 MG/ML IJ SOLN
12.5000 mg | Freq: Four times a day (QID) | INTRAMUSCULAR | Status: DC | PRN
  Administered 2023-08-15 – 2023-08-19 (×9): 12.5 mg via INTRAVENOUS
  Filled 2023-08-15 (×9): qty 1

## 2023-08-15 MED ORDER — HYDRALAZINE HCL 20 MG/ML IJ SOLN
10.0000 mg | INTRAMUSCULAR | Status: DC | PRN
  Administered 2023-08-16: 10 mg via INTRAVENOUS
  Filled 2023-08-15: qty 1

## 2023-08-15 MED ORDER — LIDOCAINE HCL 1 % IJ SOLN
INTRAMUSCULAR | Status: AC
  Filled 2023-08-15: qty 20

## 2023-08-15 MED ORDER — DIPHENHYDRAMINE HCL 50 MG/ML IJ SOLN
12.5000 mg | Freq: Once | INTRAMUSCULAR | Status: AC
Start: 2023-08-15 — End: 2023-08-15
  Administered 2023-08-15: 12.5 mg via INTRAVENOUS
  Filled 2023-08-15: qty 1

## 2023-08-15 MED ORDER — ENOXAPARIN SODIUM 30 MG/0.3ML IJ SOSY
30.0000 mg | PREFILLED_SYRINGE | INTRAMUSCULAR | Status: DC
Start: 2023-08-15 — End: 2023-08-26
  Administered 2023-08-15 – 2023-08-25 (×11): 30 mg via SUBCUTANEOUS
  Filled 2023-08-15 (×10): qty 0.3

## 2023-08-15 NOTE — H&P (Signed)
 History and Physical    Patient: Jennifer [name] ZOX:[id number] DOB: 1990/04/13 DOA: 08/15/2023 DOS: the patient was seen and examined on 08/15/2023 PCP: Calton Catholic, PA-C  Patient coming from: Home  Chief Complaint:  Chief Complaint  Patient presents with   Abdominal Pain   HPI: Jennifer [name] is a 33 y.o. female with medical history significant of paroxysmal atrial fibrillation, astrocytoma, chronic systolic CHF with recovered EF, history of prolonged QT interval, lipoprotein deficiency, familial hypercholesterolemia, cholesterolosis, NASH, stage IV CKD, class I obesity, type 2 diabetes, hypertension, hypothyroidism, recurrent pancreatitis, gastroparesis, peripheral neuropathy, history of pancytopenia, GERD, polycystic ovary syndrome who presented to the emergency department with complaints of severe abdominal pain, nausea, dry heaving but no vomiting.  She said that she woke up this morning took a small sip of juice, which triggered her pain immediately.  She also has some loose stools BM after this.  Symptoms are similar to her pancreatitis episodes. No emesis, constipation, melena or hematochezia.   No flank pain, dysuria, frequency or hematuria.   She denied fever, chills, rhinorrhea, sore throat, wheezing or hemoptysis.  No chest pain, palpitations, diaphoresis, PND, orthopnea or pitting edema of the lower extremities. No polyuria, polydipsia, polyphagia or blurred vision.   Lab work: CBC, lipase and serum pregnancy test were unremarkable.  CMP showed a BUN of 47 and creatinine of 2.26 mg/dL.  AST of 87 and ALT 117 units/L, the rest of the CMP measurements were normal.   ED course: Initial vital signs were temperature 98 F, pulse 92, respiration 18, BP 98/71 mmHg O2 sat 100% on room air.  The patient received diphenhydramine  12.5 mg IVP, famotidine  20 mg IVP, hydromorphone  0.5 mg IVP x 3 and 1000 mL normal saline bolus.  Review of Systems: As mentioned in the history of present  illness. All other systems reviewed and are negative.  Past Medical History:  Diagnosis Date   Afib (HCC) 05/12/2021   Brain tumor (HCC) 03/29/1995   astrocytoma   CHF (congestive heart failure) (HCC)    Cholesterosis    CKD (chronic kidney disease) stage 4, GFR 15-29 ml/min (HCC) 05/13/2021   DM (diabetes mellitus) (HCC) 10/10/2018   Fatty liver    HTN (hypertension) 10/10/2018   Hypothyroidism 10/10/2018   Lipoprotein deficiency    Lung disease    longevity long term   Pancreatitis    Polycystic ovary syndrome    Past Surgical History:  Procedure Laterality Date   ABDOMINAL SURGERY     pt states during miscarriage got her intestine   BRAIN SURGERY     EYE MUSCLE SURGERY Right 03/28/2014   PRESSURE SENSOR/CARDIOMEMS N/A 12/06/2021   Procedure: PRESSURE SENSOR/CARDIOMEMS;  Surgeon: Mardell Shade, MD;  Location: [facility];  Service: Cardiovascular;  Laterality: N/A;   RIGHT HEART CATH N/A 08/05/2021   Procedure: RIGHT HEART CATH;  Surgeon: Mardell Shade, MD;  Location: [facility];  Service: Cardiovascular;  Laterality: N/A;   RIGHT HEART CATH N/A 12/06/2021   Procedure: RIGHT HEART CATH;  Surgeon: Mardell Shade, MD;  Location: [facility];  Service: Cardiovascular;  Laterality: N/A;   RIGHT/LEFT HEART CATH AND CORONARY ANGIOGRAPHY N/A 12/03/2020   Procedure: RIGHT/LEFT HEART CATH AND CORONARY ANGIOGRAPHY;  Surgeon: Knox Perl, MD;  Location: [facility];  Service: Cardiovascular;  Laterality: N/A;   VENTRICULOSTOMY  03/28/1997   Social History:  reports that she has never smoked. She has never used smokeless tobacco. She reports that  she does not drink alcohol  and does not use drugs.  Allergies  Allergen Reactions   Icosapent  Ethyl (Epa Ethyl Ester) (Fish)     Hives w/Vascepa    Ketamine Other (See Comments)    In vegetative state for 15 minutes per pt   [place] Itching    Itchy throat  Itchy throat Itchy throat Itchy throat  Itchy throat    Itchy throat  Itchy throat Itchy throat Itchy throat Itchy throat    Itchy throat   Morphine  Itching, Rash and Hives    Sorethroat   Penicillins Hives, Itching and Rash    Has patient had a PCN reaction causing immediate rash, facial/tongue/throat swelling, SOB or lightheadedness with hypotension: Y Has patient had a PCN reaction causing severe rash involving mucus membranes or skin necrosis: Y Has patient had a PCN reaction that required hospitalization: N Has patient had a PCN reaction occurring within the last 10 years: Y   Clarithromycin Other (See Comments)    Stomach pain   Erythromycin      Stomach pain   Fentanyl  Nausea And Vomiting   Penicillin V Hives, Itching and Other (See Comments)    Gi upset, also   Shellfish Allergy Other (See Comments)    Pt has never had shellfish but tested positive on allergy test. Pt states contrast in CT is okay   Prednisone Rash    Family History  Problem Relation Age of Onset   Diabetes Mother    Hypertension Mother    Hyperlipidemia Mother    Thyroid  disease Mother    Hypertension Father    Diabetes Father    Pancreatic cancer Paternal Aunt    Pancreatic cancer Paternal Uncle    Colon cancer Neg Hx    Esophageal cancer Neg Hx    Stomach cancer Neg Hx     Prior to Admission medications   Medication Sig Start Date End Date Taking? Authorizing Provider  amitriptyline  (ELAVIL ) 10 MG tablet Take 10 mg by mouth at bedtime.    [provider]  amLODipine  (NORVASC ) 5 MG tablet Take 1 tablet (5 mg total) by mouth daily. 07/10/23 10/08/23  Horace Lye, PA-C  aspirin  EC 81 MG EC tablet Take 1 tablet (81 mg total) by mouth daily. Swallow whole. 12/07/20   Montey Apa, DO  azelastine  (ASTELIN ) 0.1 % nasal spray Place 2 sprays into both nostrils daily at 6 (six) AM.    [provider]  BLISOVI  FE 1.5/30 1.5-30 MG-MCG tablet Take 1 tablet by mouth daily.    [provider]   butalbital -acetaminophen -caffeine  (FIORICET ) 50-325-40 MG tablet Take 1 tablet by mouth daily as needed for headache. 09/26/22   [provider]  carvedilol  (COREG ) 6.25 MG tablet Take 1.5 tablets (9.375 mg total) by mouth 2 (two) times daily. 08/07/23   Milford, Arlice Bene, FNP  CRANBERRY CONCENTRATE PO Take 2 tablets by mouth 2 (two) times daily.    [provider]  CREON  36000-114000 units CPEP capsule Take 1 capsule (36,000 Units total) by mouth 3 (three) times daily. 05/10/23   Rai, Hurman Maiden, MD  DULoxetine  (CYMBALTA ) 60 MG capsule Take 60 mg by mouth at bedtime. 09/26/22   [provider]  ergocalciferol  (VITAMIN D2) 1.25 MG (50000 UT) capsule Take 50,000 Units by mouth every 7 (seven) days. Every Sunday    [provider]  Evolocumab  (REPATHA ) 140 MG/ML SOSY Inject 140 mg into the skin every 14 (fourteen) days. Saturdays    [provider]  fenofibrate  54 MG tablet Take 1 tablet (54 mg total) by mouth daily. Patient taking differently: Take 54 mg by mouth at bedtime. 12/01/22   [name], Aviva Lemmings, MD  hydrALAZINE  (APRESOLINE ) 50 MG tablet Take 1.5 tablets (75 mg total) by mouth 3 (three) times daily. 07/10/23   Ruddy Corral M, PA-C  insulin  lispro (HUMALOG ) 100 UNIT/ML injection Inject 30-40 Units into the skin See admin instructions. Inject subcutaneously 40 units with breakfast, 30 units with lunch and 30 units with dinner    [provider]  Insulin  Pen Needle 32G X 4 MM MISC 1 Device by Does not apply route QID. For use with insulin  pens 02/13/22   Oral Billings, MD  insulin  regular human CONCENTRATED (HUMULIN  R U-500 KWIKPEN) 500 UNIT/ML KwikPen Inject 185 Units into the skin 3 (three) times daily with meals. Patient taking differently: Inject 195 Units into the skin 2 (two) times daily with a meal. Inject subcutaneously 195 units with breakfast and lunch 09/20/22 01/09/24  Ezenduka, Nkeiruka J, MD  ivabradine  (CORLANOR ) 7.5 MG TABS  tablet Take 1 tablet (7.5 mg total) by mouth 2 (two) times daily with a meal. Patient taking differently: Take 7.5 mg by mouth at bedtime. 01/31/23   Elmarie Hacking, FNP  levocetirizine (XYZAL ) 5 MG tablet Take 5 mg by mouth daily. 03/05/22   [provider]  levothyroxine  (SYNTHROID ) 137 MCG tablet Take 137 mcg by mouth daily. 04/12/23   [provider]  metoCLOPramide  (REGLAN ) 5 MG tablet Take 1 tablet (5 mg total) by mouth every 8 (eight) hours as needed for nausea or vomiting. 05/10/23 05/09/24  Rai, Hurman Maiden, MD  pantoprazole  (PROTONIX ) 40 MG tablet Take 40 mg by mouth daily. 12/24/19   [provider]  polyethylene glycol (MIRALAX  / GLYCOLAX ) 17 g packet Take 17 g by mouth daily as needed for mild constipation. Also available OTC 05/10/23   Rai, Ripudeep K, MD  pregabalin  (LYRICA ) 300 MG capsule Take 300 mg by mouth 2 (two) times daily.    [provider]  prochlorperazine  (COMPAZINE ) 10 MG tablet Take 10 mg by mouth at bedtime.    [provider]  promethazine  (PHENERGAN ) 25 MG tablet Take 1 tablet (25 mg total) by mouth every 6 (six) hours as needed for nausea or vomiting. 08/14/23   Edmonia Gottron, PA-C  rosuvastatin  (CRESTOR ) 40 MG tablet Take 40 mg by mouth daily. 08/09/21   [provider]  Safety Seal Miscellaneous MISC Hormonic Hair Solution with minoxidil USP 7% and finasteride USP 0.05% - apply to affected areas daily every morning. 06/06/23   Dellar Fenton, DO  sodium bicarbonate  650 MG tablet Take 2 tablets (1,300 mg total) by mouth 2 (two) times daily. 05/10/23   Rai, Hurman Maiden, MD  sucralfate  (CARAFATE ) 1 g tablet Take 1 tablet (1 g total) by mouth with breakfast, with lunch, and with evening meal for 7 days. 06/18/23 06/25/23  Teddi Favors, DO  SUMAtriptan  (IMITREX ) 25 MG tablet Take 25 mg by mouth every 2 (two) hours as needed. 01/30/23   [provider]  tamsulosin  (FLOMAX ) 0.4 MG CAPS capsule Take 1 capsule (0.4  mg total) by mouth daily. 05/10/23   Rai, Hurman Maiden, MD  topiramate  (TOPAMAX ) 50 MG tablet Take 50 mg by mouth 2 (two) times daily.    [provider]  torsemide  (DEMADEX ) 20 MG tablet Take 3 tablets (60 mg total) by mouth daily. 07/10/23   Horace Lye, PA-C  tretinoin  (  RETIN-A ) 0.05 % cream Apply 1 application topically at bedtime. 01/17/21   [provider]    Physical Exam: Vitals:   08/15/23 1610 08/15/23 9604 08/15/23 0745 08/15/23 0953  BP:  98/71 122/67 110/73  Pulse:  (!) 102 (!) 105 100  Resp:  18 20 15   Temp:  98 F (36.7 C)  97.7 F (36.5 C)  TempSrc:  Oral  Oral  SpO2: 100% 99% 95% 94%   Physical Exam Vitals reviewed.  Constitutional:      General: [name] awake. [name] not in acute distress.    Appearance: [name] well-developed. [name] ill-appearing.  HENT:     Head: Normocephalic.     Nose: No rhinorrhea.     Mouth/Throat:     Mouth: Mucous membranes are dry.  Eyes:     General: No scleral icterus.    Pupils: Pupils are equal, round, and reactive to light.  Neck:     Vascular: No JVD.  Cardiovascular:     Rate and Rhythm: Regular rhythm. Tachycardia present.     Heart sounds: S1 normal and S2 normal.  Pulmonary:     Effort: Pulmonary effort is normal.     Breath sounds: Normal breath sounds. No wheezing, rhonchi or rales.  Abdominal:     General: Bowel sounds are normal. There is distension.     Palpations: Abdomen is soft.     Tenderness: There is abdominal tenderness in the right upper quadrant and epigastric area. There is no guarding or rebound.  Musculoskeletal:     Cervical back: Neck supple.     Right lower leg: No edema.     Left lower leg: No edema.  Skin:    General: Skin is warm and dry.  Neurological:     General: No focal deficit present.     Mental Status: [name] alert and oriented to person, place, and time.  Psychiatric:        Mood and Affect: Mood normal.        Behavior: Behavior normal. Behavior is  cooperative.     Data Reviewed:  Results are pending, will review when available. 10/04/2022 echocardiogram report. IMPRESSIONS:   1. Left ventricular ejection fraction, by estimation, is 50 to 55%. The  left ventricle has low normal function. The left ventricle has no regional  wall motion abnormalities. Left ventricular diastolic parameters are  indeterminate. Elevated left  ventricular end-diastolic pressure.   2. Right ventricular systolic function is normal. The right ventricular  size is normal. There is normal pulmonary artery systolic pressure. The  estimated right ventricular systolic pressure is 22.0 mmHg.   3. The mitral valve is normal in structure. Mild mitral valve  regurgitation. No evidence of mitral stenosis.   4. The aortic valve is tricuspid. Aortic valve regurgitation is mild. No  aortic stenosis is present.   5. The inferior vena cava is normal in size with greater than 50%  respiratory variability, suggesting right atrial pressure of 3 mmHg.   EKG: Vent. rate 106 BPM PR interval 115 ms QRS duration 88 ms QT/QTcB 361/480 ms P-R-T axes 38 66 3 Sinus tachycardia Consider right atrial enlargement Minimal ST depression, diffuse leads  Assessment and Plan: Principal Problem:   Intractable abdominal pain Associated with nausea, but no emesis. In the setting of:   Chronic pancreatitis (HCC) And   Familial hypertriglyceridemia Observation/telemetry. Continue IV fluids. Keep n.p.o. for now. Declined to advance diet for the moment. Analgesics as needed. Antiemetics  as needed. Pantoprazole  40 mg IVP daily. Follow CBC, CMP, triglycerides and lipase in AM. Greater risk for pancreatitis given current triglyceride level. Discussed with [facility] on-call. Will transfer to stepdown for insulin  infusion. - Will use D10% instead of D5W to decrease risk of CHF exacerbation.  Active Problems:   Gastroparesis due to DM (HCC) No emesis at the moment.    NASH  (nonalcoholic steatohepatitis)   Hepatic steatosis Following with gastroenterology. Follow-up hepatic functions.    Type 2 diabetes mellitus with hyperlipidemia,  with long-term current use of insulin  (HCC) Currently NPO. Glucoses normal. Holding insulin .    HTN (hypertension) Parenteral antihypertensives as needed while NPO.    Pulmonary hypertension (HCC) On antihypertensives. Will check    CKD (chronic kidney disease) stage 4, GFR 15-29 ml/min (HCC) Monitor renal function and electrolytes.    Coronary artery disease involving native  coronary artery of native heart without angina pectoris No angina or dyspnea. On amlodipine , ASA, carvedilol , fenofibrate  and statin.    Chronic systolic CHF (congestive heart failure) (HCC)  Monitor intake and output. Resume carvedilol  once able to tolerate diet.    Nocturnal hypoxemia Oxygen at bedtime.    Obesity (BMI 30-39.9) In the setting of high-dose insulin . Current BMI 32.24 kg/m. Difficult to address with current dosage of insulin .      Advance Care Planning:   Code Status: Full Code   Consults:   Family Communication:   Severity of Illness: The appropriate patient status for this patient is OBSERVATION. Observation status is judged to be reasonable and necessary in order to provide the required intensity of service to ensure the patient's safety. The patient's presenting symptoms, physical exam findings, and initial radiographic and laboratory data in the context of their medical condition is felt to place them at decreased risk for further clinical deterioration. Furthermore, it is anticipated that the patient will be medically stable for discharge from the hospital within 2 midnights of admission.   Author: Danice Dural, MD 08/15/2023 11:52 AM  For on call review [email or web address].   This document was prepared using Dragon voice recognition software and may contain some unintended transcription errors.

## 2023-08-15 NOTE — ED Triage Notes (Signed)
 Pt Complaints of abdominal pain, states "everything I eat or drinks makes worse". Nausea present, no vomit. Started today woke her up from sleep.   10 out of 10, pt last blood sugar was "59 and dropping".

## 2023-08-15 NOTE — ED Notes (Signed)
 ED TO INPATIENT HANDOFF REPORT  ED Nurse Name and Phone #: Loney Rivet RN [phone]  S Name/Age/Gender Jennifer [name] 33 y.o. female Room/Bed: WA15/WA15  Code Status   Code Status: Prior  Home/SNF/Other Home Patient oriented to: self, place, time, and situation Is this baseline? Yes   Triage Complete: Triage complete  Chief Complaint Intractable abdominal pain [R10.9]  Triage Note Pt Complaints of abdominal pain, states "everything I eat or drinks makes worse". Nausea present, no vomit. Started today woke her up from sleep.   10 out of 10, pt last blood sugar was "59 and dropping".    Allergies Allergies  Allergen Reactions   Icosapent  Ethyl (Epa Ethyl Ester) (Fish)     Hives w/Vascepa    Ketamine Other (See Comments)    In vegetative state for 15 minutes per pt   [place] Itching    Itchy throat  Itchy throat Itchy throat Itchy throat Itchy throat    Itchy throat  Itchy throat Itchy throat Itchy throat Itchy throat    Itchy throat   Morphine  Itching, Rash and Hives    Sorethroat   Penicillins Hives, Itching and Rash    Has patient had a PCN reaction causing immediate rash, facial/tongue/throat swelling, SOB or lightheadedness with hypotension: Y Has patient had a PCN reaction causing severe rash involving mucus membranes or skin necrosis: Y Has patient had a PCN reaction that required hospitalization: N Has patient had a PCN reaction occurring within the last 10 years: Y   Clarithromycin Other (See Comments)    Stomach pain   Erythromycin      Stomach pain   Fentanyl  Nausea And Vomiting   Penicillin V Hives, Itching and Other (See Comments)    Gi upset, also   Shellfish Allergy Other (See Comments)    Pt has never had shellfish but tested positive on allergy test. Pt states contrast in CT is okay   Prednisone Rash    Level of Care/Admitting Diagnosis ED Disposition     ED Disposition  Admit   Condition  --   Comment  H[facility] [[id number]]  Level of Care: Telemetry [5]  Admit to tele based on following criteria: Monitor QTC interval  May place patient in observation at [facility] or [facility] if equivalent level of care is available:: No  Covid Evaluation: Asymptomatic - no recent exposure (last 10 days) testing not required  Diagnosis: Intractable abdominal pain [720109]  Admitting Physician: Danice Dural [[id number]]  Attending Physician: Danice Dural [[id number]]          B Medical/Surgery History Past Medical History:  Diagnosis Date   Afib (HCC) 05/12/2021   Brain tumor (HCC) 03/29/1995   astrocytoma   CHF (congestive heart failure) (HCC)    Cholesterosis    CKD (chronic kidney disease) stage 4, GFR 15-29 ml/min (HCC) 05/13/2021   DM (diabetes mellitus) (HCC) 10/10/2018   Fatty liver    Gastroparesis due to DM (HCC) 05/12/2021   HTN (hypertension) 10/10/2018   Hypothyroidism 10/10/2018   Lipoprotein deficiency    Lung disease    longevity long term   Pancreatitis    Polycystic ovary syndrome    Past Surgical History:  Procedure Laterality Date   ABDOMINAL SURGERY     pt states during miscarriage got her intestine   BRAIN SURGERY     EYE MUSCLE SURGERY Right 03/28/2014   PRESSURE SENSOR/CARDIOMEMS N/A 12/06/2021   Procedure: PRESSURE SENSOR/CARDIOMEMS;  Surgeon: Mardell Shade, MD;  Location: [facility];  Service: Cardiovascular;  Laterality: N/A;   RIGHT HEART CATH N/A 08/05/2021   Procedure: RIGHT HEART CATH;  Surgeon: Mardell Shade, MD;  Location: [facility];  Service: Cardiovascular;  Laterality: N/A;   RIGHT HEART CATH N/A 12/06/2021   Procedure: RIGHT HEART CATH;  Surgeon: Mardell Shade, MD;  Location: [facility];  Service: Cardiovascular;  Laterality: N/A;   RIGHT/LEFT HEART CATH AND CORONARY ANGIOGRAPHY N/A 12/03/2020   Procedure: RIGHT/LEFT HEART CATH AND CORONARY ANGIOGRAPHY;  Surgeon: Knox Perl, MD;  Location: [facility];  Service: Cardiovascular;  Laterality: N/A;   VENTRICULOSTOMY  03/28/1997     A IV Location/Drains/Wounds Patient Lines/Drains/Airways Status     Active Line/Drains/Airways     Name Placement date Placement time Site Days   Peripheral IV 08/15/23 20 G 1.16" Posterior;Right Forearm 08/15/23  0639  Forearm  less than 1            Intake/Output Last 24 hours  Intake/Output Summary (Last 24 hours) at 08/15/2023 1211 Last data filed at 08/15/2023 0913 Gross per 24 hour  Intake 1043.59 ml  Output --  Net 1043.59 ml    Labs/Imaging Results for orders placed or performed during the hospital encounter of 08/15/23 (from the past 48 hours)  CBG monitoring, ED     Status: None   Collection Time: 08/15/23  6:31 AM  Result Value Ref Range   Glucose-Capillary 70 70 - 99 mg/dL    Comment: Glucose reference range applies only to samples taken after fasting for at least 8 hours.  Lipase, blood     Status: None   Collection Time: 08/15/23  6:41 AM  Result Value Ref Range   Lipase 26 11 - 51 U/L    Comment: Performed at [facility], [address] [place], [place] [address]  Comprehensive metabolic panel     Status: Abnormal   Collection Time: 08/15/23  6:41 AM  Result Value Ref Range   Sodium 136 135 - 145 mmol/L   Potassium 3.6 3.5 - 5.1 mmol/L   Chloride 99 98 - 111 mmol/L   CO2 23 22 - 32 mmol/L   Glucose, Bld 75 70 - 99 mg/dL    Comment: Glucose reference range applies only to samples taken after fasting for at least 8 hours.   BUN 47 (H) 6 - 20 mg/dL   Creatinine, Ser 6.57 (H) 0.44 - 1.00 mg/dL   Calcium  9.4 8.9 - 10.3 mg/dL   Total Protein 7.6 6.5 - 8.1 g/dL   Albumin  4.0 3.5 - 5.0 g/dL   AST 87 (H) 15 - 41 U/L   ALT 117 (H) 0 - 44 U/L   Alkaline Phosphatase 98 38 - 126 U/L   Total Bilirubin 0.9 0.0 - 1.2 mg/dL   GFR, Estimated 29 (L) >60 mL/min    Comment: (NOTE) Calculated using the CKD-EPI Creatinine Equation (2021)    Anion gap  14 5 - 15    Comment: Performed at [facility], [address], [place], [place] [address]  CBC     Status: None   Collection Time: 08/15/23  6:41 AM  Result Value Ref Range   WBC 6.9 4.0 - 10.5 K/uL   RBC 3.98 3.87 - 5.11 MIL/uL   Hemoglobin 12.2 12.0 - 15.0 g/dL   HCT 29.5 28.4 - 13.2 %   MCV 93.2 80.0 - 100.0 fL   MCH 30.7 26.0 -  34.0 pg   MCHC 32.9 30.0 - 36.0 g/dL   RDW 95.6 21.3 - 08.6 %   Platelets 151 150 - 400 K/uL   nRBC 0.0 0.0 - 0.2 %    Comment: Performed at [facility], [address], [place], [place] [address]  hCG, serum, qualitative     Status: None   Collection Time: 08/15/23  7:26 AM  Result Value Ref Range   Preg, Serum NEGATIVE NEGATIVE    Comment:        THE SENSITIVITY OF THIS METHODOLOGY IS >10 mIU/mL. Performed at [facility], [address], [place], [place] [address]    No results found.  Pending Labs Unresulted Labs (From admission, onward)     Start     Ordered   08/15/23 1141  Triglycerides  Once,   URGENT        08/15/23 1140   08/15/23 0632  Urinalysis, Routine w reflex microscopic -Urine, Clean Catch  Once,   URGENT       Question:  Specimen Source  Answer:  Urine, Clean Catch   08/15/23 0632            Vitals/Pain Today's Vitals   08/15/23 0936 08/15/23 0953 08/15/23 1151 08/15/23 1157  BP:  110/73 120/67   Pulse:  100 (!) 110   Resp:  15 20   Temp:  97.7 F (36.5 C)    TempSrc:  Oral    SpO2:  94% 95%   PainSc: 3    10-Worst pain ever    Isolation Precautions No active isolations  Medications Medications  HYDROmorphone  (DILAUDID ) injection 0.5 mg (0.5 mg Intravenous Given 08/15/23 0737)  diphenhydrAMINE  (BENADRYL ) injection 12.5 mg (12.5 mg Intravenous Given 08/15/23 0737)  sodium chloride  0.9 % bolus 1,000 mL (0 mLs Intravenous Stopped 08/15/23 0913)  famotidine  (PEPCID ) IVPB 20 mg premix (0 mg Intravenous Stopped 08/15/23 0807)  HYDROmorphone  (DILAUDID )  injection 0.5 mg (0.5 mg Intravenous Given 08/15/23 0913)  HYDROmorphone  (DILAUDID ) injection 0.5 mg (0.5 mg Intravenous Given 08/15/23 1157)    Mobility walks     Focused Assessments Neuro Assessment Handoff:  Swallow screen pass? Yes          Neuro Assessment:   Neuro Checks:      Has TPA been given? No If patient is a Neuro Trauma and patient is going to OR before floor call report to 4N Charge nurse: [phone] or [phone]   R Recommendations: See Admitting Provider Note  Report given to:   Additional Notes:

## 2023-08-15 NOTE — Procedures (Signed)
  I was present during limited US  of the abdomen to evaluate for ascites.  There is minimal peritoneal fluid with numerous mobile loops of bowel.  There is no adequate pocket of fluid to safely proceed with paracentesis.  Electronically Signed: Lovena Rubinstein, PA-C 08/15/2023, 3:13 PM

## 2023-08-15 NOTE — ED Notes (Signed)
 Juice and crackers left at bedside, pt. asleep

## 2023-08-15 NOTE — ED Notes (Signed)
Pt aware urine sample needed. Pt unable to provide sample at this time.  

## 2023-08-15 NOTE — ED Notes (Signed)
 RN called 5E s/w [place] Record informing her patient is being transported up

## 2023-08-15 NOTE — ED Notes (Addendum)
 RN left ice chips and juice at bed side. RN made patient aware that urine sample is needed, clear understanding voiced by patient.

## 2023-08-15 NOTE — ED Notes (Addendum)
 Patient tolerated juice and crackers well denied N/V did c/o ABD discomfort, aware that urine sample is needed.

## 2023-08-15 NOTE — ED Provider Notes (Signed)
 [place] EMERGENCY DEPARTMENT AT [facility] Provider Note   CSN: [id number] Arrival date & time: 08/15/23  4098     History  Chief Complaint  Patient presents with   Abdominal Pain    Jennifer [name] is a 33 y.o. 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  Patient reports that she woke up this morning took a sip of juice and it triggered immediate, sudden abdominal pain.  She says it feels reminiscent of her pancreatitis in the past.  She also had some diarrhea this morning.  She typically is on very high dose insulin  but did not take any insulin  this morning.  She says she had been scheduled for a ultrasound for abdominal ascites today.  HPI     Home Medications Prior to Admission medications   Medication Sig Start Date End Date Taking? Authorizing Provider  amitriptyline  (ELAVIL ) 10 MG tablet Take 10 mg by mouth at bedtime.    [provider]  amLODipine  (NORVASC ) 5 MG tablet Take 1 tablet (5 mg total) by mouth daily. 07/10/23 10/08/23  Horace Lye, PA-C  aspirin  EC 81 MG EC tablet Take 1 tablet (81 mg total) by mouth daily. Swallow whole. 12/07/20   Montey Apa, DO  azelastine  (ASTELIN ) 0.1 % nasal spray Place 2 sprays into both nostrils daily at 6 (six) AM.    [provider]  BLISOVI  FE 1.5/30 1.5-30 MG-MCG tablet Take 1 tablet by mouth daily.    [provider]  butalbital -acetaminophen -caffeine  (FIORICET ) 50-325-40 MG tablet Take 1 tablet by mouth daily as needed for headache. 09/26/22   [provider]  carvedilol  (COREG ) 6.25 MG tablet Take 1.5 tablets (9.375 mg total) by mouth 2 (two) times daily. 08/07/23   Milford, Arlice Bene, FNP  CRANBERRY CONCENTRATE PO Take 2 tablets by mouth 2 (two) times daily.     [provider]  CREON  36000-114000 units CPEP capsule Take 1 capsule (36,000 Units total) by mouth 3 (three) times daily. 05/10/23   Rai, Hurman Maiden, MD  DULoxetine  (CYMBALTA ) 60 MG capsule Take 60 mg by mouth at bedtime. 09/26/22   [provider]  ergocalciferol  (VITAMIN D2) 1.25 MG (50000 UT) capsule Take 50,000 Units by mouth every 7 (seven) days. Every Sunday    [provider]  Evolocumab  (REPATHA ) 140 MG/ML SOSY Inject 140 mg into the skin every 14 (fourteen) days. Saturdays    [provider]  fenofibrate  54 MG tablet Take 1 tablet (54 mg total) by mouth daily. Patient taking differently: Take 54 mg by mouth at bedtime. 12/01/22   [name], Aviva Lemmings, MD  hydrALAZINE  (APRESOLINE ) 50 MG tablet Take 1.5 tablets (75 mg total) by mouth 3 (three) times daily. 07/10/23   Ruddy Corral M, PA-C  insulin  lispro (HUMALOG ) 100 UNIT/ML injection Inject 30-40 Units into the skin See admin instructions. Inject subcutaneously 40 units with breakfast, 30 units with lunch and 30 units with dinner    [provider]  Insulin  Pen Needle 32G X 4 MM MISC 1 Device by Does not apply route QID. For use with insulin  pens 02/13/22   Oral Billings, MD  insulin  regular human CONCENTRATED (HUMULIN  R U-500 KWIKPEN) 500 UNIT/ML KwikPen Inject 185 Units into the skin 3 (three) times daily with meals. Patient taking differently: Inject 195 Units into the skin 2 (two)  times daily with a meal. Inject subcutaneously 195 units with breakfast and lunch 09/20/22 01/09/24  Ezenduka, Nkeiruka J, MD  ivabradine  (CORLANOR ) 7.5 MG TABS tablet Take 1 tablet (7.5 mg total) by mouth 2 (two) times daily with a meal. Patient taking differently: Take 7.5 mg by mouth at bedtime. 01/31/23   Milford, Arlice Bene, FNP  levocetirizine (XYZAL ) 5 MG tablet Take 5 mg by mouth daily. 03/05/22   [provider]  levothyroxine  (SYNTHROID ) 137 MCG tablet Take 137 mcg by mouth daily. 04/12/23   [provider]  metoCLOPramide  (REGLAN ) 5 MG tablet Take 1 tablet (5 mg total) by mouth every 8 (eight) hours as needed for nausea or vomiting. 05/10/23 05/09/24  Rai, Hurman Maiden, MD  pantoprazole  (PROTONIX ) 40 MG tablet Take 40 mg by mouth daily. 12/24/19   [provider]  polyethylene glycol (MIRALAX  / GLYCOLAX ) 17 g packet Take 17 g by mouth daily as needed for mild constipation. Also available OTC 05/10/23   Rai, Hurman Maiden, MD  pregabalin  (LYRICA ) 300 MG capsule Take 300 mg by mouth 2 (two) times daily.    [provider]  prochlorperazine  (COMPAZINE ) 10 MG tablet Take 10 mg by mouth at bedtime.    [provider]  promethazine  (PHENERGAN ) 25 MG tablet Take 1 tablet (25 mg total) by mouth every 6 (six) hours as needed for nausea or vomiting. 08/14/23   Edmonia Gottron, PA-C  rosuvastatin  (CRESTOR ) 40 MG tablet Take 40 mg by mouth daily. 08/09/21   [provider]  Safety Seal Miscellaneous MISC Hormonic Hair Solution with minoxidil USP 7% and finasteride USP 0.05% - apply to affected areas daily every morning. 06/06/23   Dellar Fenton, DO  sodium bicarbonate  650 MG tablet Take 2 tablets (1,300 mg total) by mouth 2 (two) times daily. 05/10/23   Rai, Hurman Maiden, MD  sucralfate  (CARAFATE ) 1 g tablet Take 1 tablet (1 g total) by mouth with breakfast, with lunch, and with evening meal for 7 days. 06/18/23 06/25/23  Teddi Favors, DO  SUMAtriptan  (IMITREX ) 25 MG tablet Take 25 mg by mouth every 2 (two) hours as needed. 01/30/23   [provider]  tamsulosin  (FLOMAX ) 0.4 MG CAPS capsule Take 1 capsule (0.4 mg total) by mouth daily. 05/10/23   Rai, Hurman Maiden, MD  topiramate  (TOPAMAX ) 50 MG tablet Take 50 mg by mouth 2 (two) times daily.    [provider]  torsemide  (DEMADEX ) 20 MG tablet Take 3 tablets (60 mg total) by mouth daily. 07/10/23   Ruddy Corral M, PA-C  tretinoin  (RETIN-A ) 0.05 % cream Apply 1 application topically at bedtime. 01/17/21    [provider]      Allergies    Icosapent  ethyl (epa ethyl ester) (fish), Ketamine, [place], Morphine , Penicillins, Clarithromycin, Erythromycin , Fentanyl , Penicillin v, Shellfish allergy, and Prednisone    Review of Systems   Review of Systems  Physical Exam Updated Vital Signs BP 110/73 (BP Location: Right Arm)   Pulse 100   Temp 97.7 F (36.5 C) (Oral)   Resp 15   SpO2 94%  Physical Exam Constitutional:      General: [name] not in acute distress. HENT:     Head: Normocephalic and atraumatic.  Eyes:     Conjunctiva/sclera: Conjunctivae normal.     Pupils: Pupils are equal, round, and reactive to light.  Cardiovascular:     Rate and Rhythm: Regular rhythm. Tachycardia present.  Pulmonary:     Effort: Pulmonary effort  is normal. No respiratory distress.  Abdominal:     General: There is no distension.     Tenderness: There is generalized abdominal tenderness.  Skin:    General: Skin is warm and dry.  Neurological:     General: No focal deficit present.     Mental Status: [name] alert. Mental status is at baseline.  Psychiatric:        Mood and Affect: Mood normal.        Behavior: Behavior normal.     ED Results / Procedures / Treatments   Labs (all labs ordered are listed, but only abnormal results are displayed) Labs Reviewed  COMPREHENSIVE METABOLIC PANEL WITH GFR - Abnormal; Notable for the following components:      Result Value   BUN 47 (*)    Creatinine, Ser 2.26 (*)    AST 87 (*)    ALT 117 (*)    GFR, Estimated 29 (*)    All other components within normal limits  LIPASE, BLOOD  CBC  HCG, SERUM, QUALITATIVE  URINALYSIS, ROUTINE W REFLEX MICROSCOPIC  TRIGLYCERIDES  CBG MONITORING, ED    EKG EKG Interpretation Date/Time:  Tuesday Aug 15 2023 06:58:08 EDT Ventricular Rate:  106 PR Interval:  115 QRS Duration:  88 QT Interval:  361 QTC Calculation: 480 R Axis:   66  Text Interpretation: Sinus tachycardia Consider right atrial  enlargement Minimal ST depression, diffuse leads Confirmed by Jerald Molly [phone]) on 08/15/2023 7:07:09 AM  Radiology No results found.  Procedures Procedures    Medications Ordered in ED Medications  HYDROmorphone  (DILAUDID ) injection 0.5 mg (has no administration in time range)  HYDROmorphone  (DILAUDID ) injection 0.5 mg (0.5 mg Intravenous Given 08/15/23 0737)  diphenhydrAMINE  (BENADRYL ) injection 12.5 mg (12.5 mg Intravenous Given 08/15/23 0737)  sodium chloride  0.9 % bolus 1,000 mL (0 mLs Intravenous Stopped 08/15/23 0913)  famotidine  (PEPCID ) IVPB 20 mg premix (0 mg Intravenous Stopped 08/15/23 0807)  HYDROmorphone  (DILAUDID ) injection 0.5 mg (0.5 mg Intravenous Given 08/15/23 0913)    ED Course/ Medical Decision Making/ A&P                                 Medical Decision Making Amount and/or Complexity of Data Reviewed Labs: ordered.  Risk Prescription drug management.   This patient presents to the ED with concern for cute onset generalized abdominal pain. This involves an extensive number of treatment options, and is a complaint that carries with it a high risk of complications and morbidity.  The differential diagnosis includes gastritis versus gastroparesis versus pancreatitis versus biliary disease versus chronic abdominal pain versus other  Co-morbidities that complicate the patient evaluation: History of diabetes, hypertriglyceridemia, and risk of recurring pancreatitis  External records from outside source obtained and reviewed including CT imaging and March 2025 for acute abdominal pain and vomiting, with no emergent findings at that time, specifically noted to have mild splenomegaly, hepatomegaly and hepatic steatosis, and advanced atheromatous vascular calcification of the lower abdominal aorta and common iliacs, but normal gallbladder and no emergent pancreatitis findings.  Patient had her pain and symptoms treated in the ED at the time most able to tolerate p.o.  and was discharged home.  I ordered and personally interpreted labs.  The pertinent results include: Labs near baseline levels.  Lipase and white blood cell count within normal limits.  Creatinine near chronic CKD level.  I will add on triglyceride levels.  UA still pending  The patient was maintained on a cardiac monitor.  I personally viewed and interpreted the cardiac monitored which showed an underlying rhythm of: Sinus rhythm and sinus tachycardia  Per my interpretation the patient's ECG shows sinus tachycardia with baseline wander  I ordered medication including IV Pepcid , IV fluids, IV pain and nausea medication for suspected gastritis and hydration  I have reviewed the patients home medicines and have made adjustments as needed  Test Considered: I do not believe the patient is requiring an emergent repeat CT scan of the abdomen.  This is a similar presentation to March, and I do not have evidence at this time of acute ischemic bowel or biliary disease.  After the interventions noted above, I reevaluated the patient and found that they have: stayed the same  Patient was able to tolerate a small amount of p.o. but reports that she still having significant abdominal pain with eating, and is concerned that she would need to return to the hospital by the end of the day if she goes home in this state.  For my review of her medical record, she does appear to experience chronic intermittent abdominal pains, but these are sometimes adequately treated in the ED and sometimes require hospitalization.  [name] at high risk of complications and DKA given her high dose insulin  regimen for diabetes, and at risk for dehydration as well.  She also suffers from hypertriglyceridemia.  We will admit her to the hospital at this time.  Patient had been planned for ultrasound to evaluate for abdominal ascites, and I will discuss this with the hospitalist if they wish to pursue this.  I do not believe that this  is SBP  Disposition:  After consideration of the diagnostic results and the patients response to treatment, I feel that the patient would benefit from medical admission.         Final Clinical Impression(s) / ED Diagnoses Final diagnoses:  Generalized abdominal pain  Nausea and vomiting, unspecified vomiting type    Rx / DC Orders ED Discharge Orders     None         Arvilla Birmingham, MD 08/15/23 1144

## 2023-08-15 NOTE — ED Provider Notes (Signed)
 Discussed with hospitalist - will proceed with IR paracentesis if able to complete in hospital   Arvilla Birmingham, MD 08/15/23 1157

## 2023-08-15 NOTE — ED Notes (Signed)
 Pt unable to provide urine sample at this time

## 2023-08-16 ENCOUNTER — Observation Stay (HOSPITAL_COMMUNITY)

## 2023-08-16 DIAGNOSIS — E781 Pure hyperglyceridemia: Secondary | ICD-10-CM | POA: Diagnosis present

## 2023-08-16 DIAGNOSIS — E039 Hypothyroidism, unspecified: Secondary | ICD-10-CM | POA: Diagnosis present

## 2023-08-16 DIAGNOSIS — I5023 Acute on chronic systolic (congestive) heart failure: Secondary | ICD-10-CM | POA: Diagnosis present

## 2023-08-16 DIAGNOSIS — I5021 Acute systolic (congestive) heart failure: Secondary | ICD-10-CM | POA: Diagnosis not present

## 2023-08-16 DIAGNOSIS — E11649 Type 2 diabetes mellitus with hypoglycemia without coma: Secondary | ICD-10-CM | POA: Diagnosis not present

## 2023-08-16 DIAGNOSIS — E872 Acidosis, unspecified: Secondary | ICD-10-CM | POA: Diagnosis present

## 2023-08-16 DIAGNOSIS — I509 Heart failure, unspecified: Secondary | ICD-10-CM | POA: Diagnosis not present

## 2023-08-16 DIAGNOSIS — I130 Hypertensive heart and chronic kidney disease with heart failure and stage 1 through stage 4 chronic kidney disease, or unspecified chronic kidney disease: Secondary | ICD-10-CM | POA: Diagnosis present

## 2023-08-16 DIAGNOSIS — E1165 Type 2 diabetes mellitus with hyperglycemia: Secondary | ICD-10-CM | POA: Diagnosis present

## 2023-08-16 DIAGNOSIS — E1169 Type 2 diabetes mellitus with other specified complication: Secondary | ICD-10-CM

## 2023-08-16 DIAGNOSIS — R109 Unspecified abdominal pain: Secondary | ICD-10-CM | POA: Diagnosis not present

## 2023-08-16 DIAGNOSIS — K7581 Nonalcoholic steatohepatitis (NASH): Secondary | ICD-10-CM | POA: Diagnosis present

## 2023-08-16 DIAGNOSIS — E875 Hyperkalemia: Secondary | ICD-10-CM | POA: Diagnosis present

## 2023-08-16 DIAGNOSIS — K859 Acute pancreatitis without necrosis or infection, unspecified: Secondary | ICD-10-CM | POA: Diagnosis not present

## 2023-08-16 DIAGNOSIS — E669 Obesity, unspecified: Secondary | ICD-10-CM

## 2023-08-16 DIAGNOSIS — I2510 Atherosclerotic heart disease of native coronary artery without angina pectoris: Secondary | ICD-10-CM | POA: Diagnosis not present

## 2023-08-16 DIAGNOSIS — E88819 Insulin resistance, unspecified: Secondary | ICD-10-CM | POA: Diagnosis present

## 2023-08-16 DIAGNOSIS — R188 Other ascites: Secondary | ICD-10-CM | POA: Diagnosis present

## 2023-08-16 DIAGNOSIS — Z794 Long term (current) use of insulin: Secondary | ICD-10-CM | POA: Diagnosis not present

## 2023-08-16 DIAGNOSIS — I272 Pulmonary hypertension, unspecified: Secondary | ICD-10-CM

## 2023-08-16 DIAGNOSIS — E785 Hyperlipidemia, unspecified: Secondary | ICD-10-CM

## 2023-08-16 DIAGNOSIS — I10 Essential (primary) hypertension: Secondary | ICD-10-CM

## 2023-08-16 DIAGNOSIS — E1142 Type 2 diabetes mellitus with diabetic polyneuropathy: Secondary | ICD-10-CM | POA: Diagnosis present

## 2023-08-16 DIAGNOSIS — I5022 Chronic systolic (congestive) heart failure: Secondary | ICD-10-CM

## 2023-08-16 DIAGNOSIS — G4734 Idiopathic sleep related nonobstructive alveolar hypoventilation: Secondary | ICD-10-CM

## 2023-08-16 DIAGNOSIS — Z6832 Body mass index (BMI) 32.0-32.9, adult: Secondary | ICD-10-CM | POA: Diagnosis not present

## 2023-08-16 DIAGNOSIS — K3184 Gastroparesis: Secondary | ICD-10-CM

## 2023-08-16 DIAGNOSIS — N179 Acute kidney failure, unspecified: Secondary | ICD-10-CM | POA: Diagnosis present

## 2023-08-16 DIAGNOSIS — R1084 Generalized abdominal pain: Secondary | ICD-10-CM | POA: Diagnosis present

## 2023-08-16 DIAGNOSIS — E1143 Type 2 diabetes mellitus with diabetic autonomic (poly)neuropathy: Secondary | ICD-10-CM

## 2023-08-16 DIAGNOSIS — N185 Chronic kidney disease, stage 5: Secondary | ICD-10-CM | POA: Diagnosis not present

## 2023-08-16 DIAGNOSIS — K861 Other chronic pancreatitis: Secondary | ICD-10-CM

## 2023-08-16 DIAGNOSIS — I255 Ischemic cardiomyopathy: Secondary | ICD-10-CM | POA: Diagnosis not present

## 2023-08-16 DIAGNOSIS — K858 Other acute pancreatitis without necrosis or infection: Secondary | ICD-10-CM | POA: Diagnosis present

## 2023-08-16 DIAGNOSIS — N184 Chronic kidney disease, stage 4 (severe): Secondary | ICD-10-CM | POA: Diagnosis present

## 2023-08-16 DIAGNOSIS — N189 Chronic kidney disease, unspecified: Secondary | ICD-10-CM | POA: Diagnosis not present

## 2023-08-16 DIAGNOSIS — E871 Hypo-osmolality and hyponatremia: Secondary | ICD-10-CM | POA: Diagnosis present

## 2023-08-16 DIAGNOSIS — I428 Other cardiomyopathies: Secondary | ICD-10-CM | POA: Diagnosis present

## 2023-08-16 DIAGNOSIS — I480 Paroxysmal atrial fibrillation: Secondary | ICD-10-CM | POA: Diagnosis present

## 2023-08-16 LAB — GLUCOSE, CAPILLARY
Glucose-Capillary: 101 mg/dL — ABNORMAL HIGH (ref 70–99)
Glucose-Capillary: 106 mg/dL — ABNORMAL HIGH (ref 70–99)
Glucose-Capillary: 147 mg/dL — ABNORMAL HIGH (ref 70–99)
Glucose-Capillary: 185 mg/dL — ABNORMAL HIGH (ref 70–99)
Glucose-Capillary: 207 mg/dL — ABNORMAL HIGH (ref 70–99)
Glucose-Capillary: 220 mg/dL — ABNORMAL HIGH (ref 70–99)
Glucose-Capillary: 234 mg/dL — ABNORMAL HIGH (ref 70–99)
Glucose-Capillary: 81 mg/dL (ref 70–99)
Glucose-Capillary: 85 mg/dL (ref 70–99)
Glucose-Capillary: 85 mg/dL (ref 70–99)
Glucose-Capillary: 86 mg/dL (ref 70–99)
Glucose-Capillary: 87 mg/dL (ref 70–99)
Glucose-Capillary: 90 mg/dL (ref 70–99)
Glucose-Capillary: 92 mg/dL (ref 70–99)
Glucose-Capillary: 92 mg/dL (ref 70–99)
Glucose-Capillary: 94 mg/dL (ref 70–99)
Glucose-Capillary: 94 mg/dL (ref 70–99)
Glucose-Capillary: 96 mg/dL (ref 70–99)
Glucose-Capillary: 97 mg/dL (ref 70–99)

## 2023-08-16 LAB — ECHOCARDIOGRAM COMPLETE
AR max vel: 1.89 cm2
AV Area VTI: 1.82 cm2
AV Area mean vel: 1.81 cm2
AV Mean grad: 5 mmHg
AV Peak grad: 8 mmHg
Ao pk vel: 1.41 m/s
Area-P 1/2: 8.15 cm2
Height: 57 in
S' Lateral: 3.5 cm
Weight: 2455.04 [oz_av]

## 2023-08-16 LAB — COMPREHENSIVE METABOLIC PANEL WITH GFR
ALT: 98 U/L — ABNORMAL HIGH (ref 0–44)
AST: 85 U/L — ABNORMAL HIGH (ref 15–41)
Albumin: 3.8 g/dL (ref 3.5–5.0)
Alkaline Phosphatase: 84 U/L (ref 38–126)
Anion gap: 13 (ref 5–15)
BUN: 48 mg/dL — ABNORMAL HIGH (ref 6–20)
CO2: 19 mmol/L — ABNORMAL LOW (ref 22–32)
Calcium: 8.5 mg/dL — ABNORMAL LOW (ref 8.9–10.3)
Chloride: 102 mmol/L (ref 98–111)
Creatinine, Ser: 2.35 mg/dL — ABNORMAL HIGH (ref 0.44–1.00)
GFR, Estimated: 28 mL/min — ABNORMAL LOW (ref >60)
Glucose, Bld: 87 mg/dL (ref 70–99)
Potassium: 3.6 mmol/L (ref 3.5–5.1)
Sodium: 134 mmol/L — ABNORMAL LOW (ref 135–145)
Total Bilirubin: 0.7 mg/dL (ref 0.0–1.2)
Total Protein: 6.9 g/dL (ref 6.5–8.1)

## 2023-08-16 LAB — CBC
HCT: 33.9 % — ABNORMAL LOW (ref 36.0–46.0)
Hemoglobin: 10.6 g/dL — ABNORMAL LOW (ref 12.0–15.0)
MCH: 30.3 pg (ref 26.0–34.0)
MCHC: 31.3 g/dL (ref 30.0–36.0)
MCV: 96.9 fL (ref 80.0–100.0)
Platelets: 123 10*3/uL — ABNORMAL LOW (ref 150–400)
RBC: 3.5 MIL/uL — ABNORMAL LOW (ref 3.87–5.11)
RDW: 14.6 % (ref 11.5–15.5)
WBC: 8.6 10*3/uL (ref 4.0–10.5)
nRBC: 0 % (ref 0.0–0.2)

## 2023-08-16 LAB — TRIGLYCERIDES
Triglycerides: 1761 mg/dL — ABNORMAL HIGH (ref <150)
Triglycerides: 1534 mg/dL — ABNORMAL HIGH (ref <150)

## 2023-08-16 MED ORDER — CALCITRIOL 0.25 MCG PO CAPS
0.2500 ug | ORAL_CAPSULE | Freq: Every day | ORAL | Status: DC
  Administered 2023-08-17 – 2023-08-26 (×10): 0.25 ug via ORAL
  Filled 2023-08-16 (×10): qty 1

## 2023-08-16 MED ORDER — AZELASTINE HCL 0.1 % NA SOLN
2.0000 | Freq: Every day | NASAL | Status: DC
  Administered 2023-08-17 – 2023-08-26 (×10): 2 via NASAL
  Filled 2023-08-16: qty 30

## 2023-08-16 MED ORDER — BUTALBITAL-APAP-CAFFEINE 50-325-40 MG PO TABS: 1.0000 | ORAL_TABLET | Freq: Every day | ORAL | Status: DC | PRN

## 2023-08-16 MED ORDER — IVABRADINE HCL 7.5 MG PO TABS
7.5000 mg | ORAL_TABLET | Freq: Two times a day (BID) | ORAL | Status: DC
Start: 2023-08-16 — End: 2023-08-16

## 2023-08-16 MED ORDER — AMITRIPTYLINE HCL 10 MG PO TABS
10.0000 mg | ORAL_TABLET | Freq: Every day | ORAL | Status: DC
  Administered 2023-08-16 – 2023-08-19 (×4): 10 mg via ORAL
  Filled 2023-08-16 (×4): qty 1

## 2023-08-16 MED ORDER — ACETAMINOPHEN-CODEINE 300-30 MG PO TABS: 1.0000 | ORAL_TABLET | Freq: Four times a day (QID) | ORAL | Status: DC | PRN

## 2023-08-16 MED ORDER — ASPIRIN 81 MG PO TBEC
81.0000 mg | DELAYED_RELEASE_TABLET | Freq: Every day | ORAL | Status: DC
  Administered 2023-08-17 – 2023-08-26 (×10): 81 mg via ORAL
  Filled 2023-08-16 (×10): qty 1

## 2023-08-16 MED ORDER — POLYETHYLENE GLYCOL 3350 17 G PO PACK: 17.0000 g | PACK | Freq: Every day | ORAL | Status: DC | PRN

## 2023-08-16 MED ORDER — IVABRADINE 2.5 MG HALF TABLET
2.5000 mg | ORAL_TABLET | Freq: Two times a day (BID) | ORAL | Status: DC
  Administered 2023-08-17 – 2023-08-26 (×19): 2.5 mg via ORAL
  Filled 2023-08-16 (×20): qty 1

## 2023-08-16 MED ORDER — CAMPHOR-MENTHOL 0.5-0.5 % EX LOTN
TOPICAL_LOTION | CUTANEOUS | Status: DC | PRN
  Filled 2023-08-16: qty 222

## 2023-08-16 MED ORDER — METOCLOPRAMIDE HCL 5 MG/ML IJ SOLN
5.0000 mg | Freq: Four times a day (QID) | INTRAMUSCULAR | Status: DC
  Administered 2023-08-16 – 2023-08-18 (×8): 5 mg via INTRAVENOUS
  Filled 2023-08-16 (×8): qty 2

## 2023-08-16 MED ORDER — PERFLUTREN LIPID MICROSPHERE
1.0000 mL | INTRAVENOUS | Status: AC | PRN
  Administered 2023-08-16: 2 mL via INTRAVENOUS

## 2023-08-16 MED ORDER — DULOXETINE HCL 30 MG PO CPEP
60.0000 mg | ORAL_CAPSULE | Freq: Every day | ORAL | Status: DC
  Administered 2023-08-16 – 2023-08-19 (×4): 60 mg via ORAL
  Filled 2023-08-16 (×4): qty 2

## 2023-08-16 MED ORDER — PANCRELIPASE (LIP-PROT-AMYL) 12000-38000 UNITS PO CPEP
36000.0000 [IU] | ORAL_CAPSULE | Freq: Three times a day (TID) | ORAL | Status: DC
  Administered 2023-08-17 – 2023-08-26 (×28): 36000 [IU] via ORAL
  Filled 2023-08-16 (×2): qty 3
  Filled 2023-08-16: qty 1
  Filled 2023-08-16: qty 3
  Filled 2023-08-16 (×3): qty 1
  Filled 2023-08-16 (×2): qty 3
  Filled 2023-08-16: qty 1
  Filled 2023-08-16: qty 3
  Filled 2023-08-16 (×4): qty 1
  Filled 2023-08-16 (×2): qty 3
  Filled 2023-08-16 (×2): qty 1
  Filled 2023-08-16: qty 3
  Filled 2023-08-16 (×5): qty 1
  Filled 2023-08-16: qty 3
  Filled 2023-08-16 (×3): qty 1

## 2023-08-16 MED ORDER — IVABRADINE HCL 5 MG PO TABS
5.0000 mg | ORAL_TABLET | Freq: Two times a day (BID) | ORAL | Status: DC
  Administered 2023-08-17 – 2023-08-26 (×19): 5 mg via ORAL
  Filled 2023-08-16 (×20): qty 1

## 2023-08-16 MED ORDER — VITAMIN D (ERGOCALCIFEROL) 1.25 MG (50000 UNIT) PO CAPS
50000.0000 [IU] | ORAL_CAPSULE | ORAL | Status: DC
  Administered 2023-08-20: 50000 [IU] via ORAL
  Filled 2023-08-16: qty 1

## 2023-08-16 MED ORDER — SODIUM BICARBONATE 650 MG PO TABS
1300.0000 mg | ORAL_TABLET | Freq: Two times a day (BID) | ORAL | Status: DC
  Administered 2023-08-16 – 2023-08-21 (×10): 1300 mg via ORAL
  Filled 2023-08-16 (×10): qty 2

## 2023-08-16 MED ORDER — TORSEMIDE 20 MG PO TABS
60.0000 mg | ORAL_TABLET | Freq: Every day | ORAL | Status: DC
  Administered 2023-08-17: 60 mg via ORAL
  Filled 2023-08-16 (×2): qty 3

## 2023-08-16 MED ORDER — CHLORHEXIDINE GLUCONATE CLOTH 2 % EX PADS
6.0000 | MEDICATED_PAD | Freq: Every day | CUTANEOUS | Status: DC
  Administered 2023-08-17 – 2023-08-26 (×7): 6 via TOPICAL

## 2023-08-16 MED ORDER — LEVOTHYROXINE SODIUM 50 MCG PO TABS
150.0000 ug | ORAL_TABLET | Freq: Every day | ORAL | Status: DC
  Administered 2023-08-17 – 2023-08-26 (×10): 150 ug via ORAL
  Filled 2023-08-16 (×3): qty 1
  Filled 2023-08-16 (×6): qty 2
  Filled 2023-08-16: qty 1

## 2023-08-16 MED ORDER — LORATADINE 10 MG PO TABS: 10.0000 mg | ORAL_TABLET | Freq: Every day | ORAL | Status: DC | PRN

## 2023-08-16 MED ORDER — PREGABALIN 75 MG PO CAPS
300.0000 mg | ORAL_CAPSULE | Freq: Two times a day (BID) | ORAL | Status: DC
  Administered 2023-08-16 – 2023-08-19 (×6): 300 mg via ORAL
  Filled 2023-08-16 (×6): qty 4

## 2023-08-16 MED ORDER — LORATADINE 10 MG PO TABS
10.0000 mg | ORAL_TABLET | Freq: Every evening | ORAL | Status: DC
  Administered 2023-08-16 – 2023-08-25 (×10): 10 mg via ORAL
  Filled 2023-08-16 (×10): qty 1

## 2023-08-16 MED ORDER — PANTOPRAZOLE SODIUM 40 MG PO TBEC
40.0000 mg | DELAYED_RELEASE_TABLET | Freq: Every day | ORAL | Status: DC
Start: 2023-08-17 — End: 2023-08-26
  Administered 2023-08-17 – 2023-08-26 (×10): 40 mg via ORAL
  Filled 2023-08-16 (×10): qty 1

## 2023-08-16 MED ORDER — AMLODIPINE BESYLATE 5 MG PO TABS
5.0000 mg | ORAL_TABLET | Freq: Every day | ORAL | Status: DC
  Administered 2023-08-16 – 2023-08-18 (×3): 5 mg via ORAL
  Filled 2023-08-16 (×3): qty 1

## 2023-08-16 MED ORDER — TOPIRAMATE 25 MG PO TABS
50.0000 mg | ORAL_TABLET | Freq: Two times a day (BID) | ORAL | Status: DC
  Administered 2023-08-16 – 2023-08-21 (×10): 50 mg via ORAL
  Filled 2023-08-16 (×10): qty 2

## 2023-08-16 MED ORDER — CARVEDILOL 6.25 MG PO TABS
9.3750 mg | ORAL_TABLET | Freq: Two times a day (BID) | ORAL | Status: DC
  Administered 2023-08-16 – 2023-08-21 (×10): 9.375 mg via ORAL
  Filled 2023-08-16 (×10): qty 1

## 2023-08-16 NOTE — Progress Notes (Signed)
 PROGRESS NOTE    Idali Lafever  JYN:[id number] DOB: Mar 21, 1991 DOA: 08/15/2023 PCP: Calton Catholic, PA-C    Chief Complaint  Patient presents with   Abdominal Pain    Brief Narrative:  Patient is a 33 year old female history of paroxysmal A-fib, astrocytoma, chronic systolic CHF with recovered EF, history of prolonged QT interval, lipoprotein deficiency, familial hypercholesterolemia, cholesterolosis, NASH, stage IV CKD, class I obesity, type 2 diabetes, hypertension hypothyroidism, recurrent pancreatitis, gastroparesis, peripheral neuropathy, history of pancytopenia, GERD, PCOS presented to the ED with severe abdominal pain nausea dry heaves.  Patient stated abdominal pain occurred when she woke up and took a small sip of juice which triggered her pain immediately.  Patient also noted with some loose stools.  Patient seen in the ED, CBC, lipase, serum pregnancy test unremarkable.  CMP with a BUN of 47, creatinine of 2.26, AST of 87, ALT of 117.  Patient noted to have a significantly elevated triglyceride level of 2288 patient admitted to stepdown unit, placed on the insulin  drip due to concerns for possible pancreatitis induced hypertriglyceridemia.   Assessment & Plan:   Principal Problem:   Intractable abdominal pain Active Problems:   Familial hypertriglyceridemia   Type 2 diabetes mellitus with hyperlipidemia (HCC)   Hypothyroidism   Chronic systolic CHF (congestive heart failure) (HCC)   HTN (hypertension)   CKD (chronic kidney disease) stage 4, GFR 15-29 ml/min (HCC)   Pulmonary hypertension (HCC)   NASH (nonalcoholic steatohepatitis)   Obesity (BMI 30-39.9)   Gastroparesis due to DM  [facility])   Hepatic steatosis   Coronary artery disease involving native coronary artery of native heart without angina pectoris   Nocturnal hypoxemia   Chronic pancreatitis (HCC)  #1 familial hypertriglyceridemia/acute on chronic pancreatitis secondary to hypertriglyceridemia - Patient noted  to have presented with sudden onset epigastric abdominal pain nausea, dry heaves, patient with a history of chronic pancreatitis. - Triglyceride level noted at 2288 on admission which is trending down currently at 1761 this morning. - Lipase level noted at 26. - LFTs noted to be elevated but trending down, patient with history of NASH. - Right upper quadrant ultrasound done with small volume ascites without pocket of fluid large enough to allow for safe approach for paracentesis. - Patient with some clinical improvement. - Continue IV insulin  drip until triglycerides level < 500. - Continue D10 infusion and decrease rate to 50 cc an hour. - Clear liquid diet and advance as tolerated to full liquid diet today and if continued clinical improvement could transition to a heart healthy diet tomorrow. - Check triglycerides levels every 12 hours. - Once triglyceride level <500 we will discontinue insulin  drip and placed on home regimen of fenofibrate  and statin/Crestor . -Change CBGs to every 2 hours. - Supportive care.  2.  Gastroparesis due to diabetes mellitus -Place on IV Reglan  5 mg every 6 hours and once tolerating the diet with clinical improvement could transition to oral Reglan .  3.  NASH/hepatic steatosis -Outpatient follow-up with gastroenterology.  4.  Diabetes mellitus type 2 with hyperglycemia with long-term insulin  use -Hemoglobin A1c 6.1 (2/8/20250 - CBG noted at 87 this morning. - Patient on insulin  drip secondary to problem #1. - Change CBGs to every 2 hours. - Resume home regimen Lyrica .  5.  Hypertension -Resume home regimen of Norvasc , Coreg , Demadex .  6.  Pulmonary hypertension -Resume home medications.  7.  CKD stage IV -Stable. - Resume home regimen of bicarb tablets, Demadex , calcitriol.  8.  CAD/chronic systolic CHF -Resume home  cardiac regimen of aspirin , amlodipine , carvedilol , Demadex . - Hold fenofibrate  and statin until triglyceride level is < 500. - 2D  echo obtained with a EF of 45 to 50%, left ventricular global hypokinesis. - Will consult with cardiology due to abnormal 2D echo for further evaluation and recommendations.  9. nocturnal hypoxemia -Continue O2 at bedtime.  10.  Obesity -BMI 32.24 kg/m. - Lifestyle modification. - Outpatient follow-up with PCP.  11.  Hypothyroidism -Resume home regimen Synthroid .   DVT prophylaxis: Lovenox  Code Status: Full Family Communication: Updated patient.  No family at bedside. Disposition: Likely home when clinically improved and back to baseline.  Status is: Observation The patient will require care spanning > 2 midnights and should be moved to inpatient because: Severity of illness   Consultants:  None  Procedures:  Right upper quadrant ultrasound 08/15/2023 2D echo 08/16/2023   Antimicrobials: Anti-infectives (From admission, onward)    None         Subjective: Patient laying in bed.  Overall feeling better in terms of her epigastric abdominal pain.  Denies any further nausea or emesis.  Tolerating ice chips.  Would like to try clears.  Objective: Vitals:   08/16/23 0400 08/16/23 0800 08/16/23 0842 08/16/23 0900  BP: (!) 162/99  (!) 143/75   Pulse: (!) 117 (!) 114  (!) 112  Resp: 17 14  19   Temp: 98.4 F (36.9 C) 98 F (36.7 C)    TempSrc: Oral Oral    SpO2: 99% 94%  95%  Weight:      Height:        Intake/Output Summary (Last 24 hours) at 08/16/2023 1029 Last data filed at 08/16/2023 0900 Gross per 24 hour  Intake 1236.32 ml  Output --  Net 1236.32 ml   Filed Weights   08/15/23 1744  Weight: 69.6 kg    Examination:  General exam: Appears calm and comfortable  Respiratory system: Minimal expiratory wheezing.  Upper airway noise.  No crackles.  Fair air movement.  Speaking in full sentences.  No use of accessory muscles of respiration.  Cardiovascular system: Tachycardia.  No JVD, no murmurs rubs or gallops.  No lower extremity edema.  Gastrointestinal  system: Abdomen is nondistended, soft and decreased tenderness to palpation in the epigastrium.  Positive bowel sounds.  No rebound.  No guarding. Central nervous system: Alert and oriented. No focal neurological deficits. Extremities: Symmetric 5 x 5 power. Skin: No rashes, lesions or ulcers Psychiatry: Judgement and insight appear normal. Mood & affect appropriate.     Data Reviewed: I have personally reviewed following labs and imaging studies  CBC: Recent Labs  Lab 08/14/23 1124 08/15/23 0641 08/16/23 0340  WBC 4.9 6.9 8.6  NEUTROABS 2.0  --   --   HGB 12.3 12.2 10.6*  HCT 36.2 37.1 33.9*  MCV 88.8 93.2 96.9  PLT 144.0* 151 123*    Basic Metabolic Panel: Recent Labs  Lab 08/14/23 1124 08/15/23 0641 08/16/23 0340  NA 136 136 134*  K 3.7 3.6 3.6  CL 99 99 102  CO2 26 23 19*  GLUCOSE 93 75 87  BUN 40* 47* 48*  CREATININE 1.94* 2.26* 2.35*  CALCIUM  9.8 9.4 8.5*    GFR: Estimated Creatinine Clearance: 27.7 mL/min (A) (by C-G formula based on SCr of 2.35 mg/dL (H)).  Liver Function Tests: Recent Labs  Lab 08/14/23 1124 08/15/23 0641 08/16/23 0340  AST 120* 87* 85*  ALT 128* 117* 98*  ALKPHOS 96 98 84  BILITOT 0.6 0.9  0.7  PROT 7.8 7.6 6.9  ALBUMIN  4.5 4.0 3.8    CBG: Recent Labs  Lab 08/16/23 0546 08/16/23 0657 08/16/23 0756 08/16/23 0911 08/16/23 0957  GLUCAP 85 92 86 85 81     Recent Results (from the past 240 hours)  MRSA Next Gen by PCR, Nasal     Status: None   Collection Time: 08/15/23  5:21 PM   Specimen: Nasal Mucosa; Nasal Swab  Result Value Ref Range Status   MRSA by PCR Next Gen NOT DETECTED NOT DETECTED Final    Comment: (NOTE) The GeneXpert MRSA Assay (FDA approved for NASAL specimens only), is one component of a comprehensive MRSA colonization surveillance program. It is not intended to diagnose MRSA infection nor to guide or monitor treatment for MRSA infections. Test performance is not FDA approved in patients less than 33  years old. Performed at [facility], [address], [place], [place] [address]          Radiology Studies: US  Abdomen Limited Result Date: 08/15/2023 CLINICAL DATA:  33 year old female with history of chronic pancreatitis, epigastric pain, and gastroparesis, with new onset ascites. IR requested for diagnostic and therapeutic paracentesis. EXAM: ULTRASOUND ABDOMEN LIMITED COMPARISON:  CT abdomen pelvis with contrast on 06/21/2023 FINDINGS: Limited ultrasound done of all 4 quadrants. There is only small volume of ascites present. There is no pocket of fluid large enough to allow for safe approach for paracentesis. IMPRESSION: Small volume ascites without pocket of fluid large enough to allow for safe approach for paracentesis. Ultrasound performed by: Lambert Pillion, PA-C Electronically Signed   By: Creasie Doctor M.D.   On: 08/15/2023 15:52        Scheduled Meds:  Chlorhexidine  Gluconate Cloth  6 each Topical Daily   enoxaparin  (LOVENOX ) injection  30 mg Subcutaneous Q24H   metoCLOPramide  (REGLAN ) injection  5 mg Intravenous Q6H   Continuous Infusions:  dextrose  75 mL/hr at 08/16/23 0900   famotidine  (PEPCID ) IV Stopped (08/16/23 0715)   insulin  0.1 Units/kg/hr (08/16/23 0900)     LOS: 0 days    Time spent: 40 minutes    Hilda Lovings, MD [facility]   To contact the attending provider between 7A-7P or the covering provider during after hours 7P-7A, please log into the web site [email or web address] and access using universal [place] password for that web site. If you do not have the password, please call the hospital operator.  08/16/2023, 10:29 AM

## 2023-08-16 NOTE — Progress Notes (Signed)
   08/16/23 1302  TOC Brief Assessment  Insurance and Status Reviewed  Patient has primary care physician Yes  Home environment has been reviewed return home  Prior level of function: independent  Prior/Current Home Services No current home services  Social Drivers of Health Review SDOH reviewed no interventions necessary  Readmission risk has been reviewed Yes  Transition of care needs no transition of care needs at this time

## 2023-08-16 NOTE — Plan of Care (Signed)

## 2023-08-17 ENCOUNTER — Ambulatory Visit: Payer: Self-pay | Admitting: Physician Assistant

## 2023-08-17 DIAGNOSIS — E781 Pure hyperglyceridemia: Secondary | ICD-10-CM | POA: Diagnosis not present

## 2023-08-17 DIAGNOSIS — I2510 Atherosclerotic heart disease of native coronary artery without angina pectoris: Secondary | ICD-10-CM | POA: Diagnosis not present

## 2023-08-17 DIAGNOSIS — R109 Unspecified abdominal pain: Secondary | ICD-10-CM | POA: Diagnosis not present

## 2023-08-17 DIAGNOSIS — I5022 Chronic systolic (congestive) heart failure: Secondary | ICD-10-CM | POA: Diagnosis not present

## 2023-08-17 DIAGNOSIS — N184 Chronic kidney disease, stage 4 (severe): Secondary | ICD-10-CM | POA: Diagnosis not present

## 2023-08-17 LAB — COMPREHENSIVE METABOLIC PANEL WITH GFR
ALT: 94 U/L — ABNORMAL HIGH (ref 0–44)
AST: 83 U/L — ABNORMAL HIGH (ref 15–41)
Albumin: 3.9 g/dL (ref 3.5–5.0)
Alkaline Phosphatase: 84 U/L (ref 38–126)
Anion gap: 11 (ref 5–15)
BUN: 37 mg/dL — ABNORMAL HIGH (ref 6–20)
CO2: 22 mmol/L (ref 22–32)
Calcium: 8.9 mg/dL (ref 8.9–10.3)
Chloride: 104 mmol/L (ref 98–111)
Creatinine, Ser: 2 mg/dL — ABNORMAL HIGH (ref 0.44–1.00)
GFR, Estimated: 33 mL/min — ABNORMAL LOW (ref >60)
Glucose, Bld: 88 mg/dL (ref 70–99)
Potassium: 3.8 mmol/L (ref 3.5–5.1)
Sodium: 137 mmol/L (ref 135–145)
Total Bilirubin: 0.8 mg/dL (ref 0.0–1.2)
Total Protein: 7.1 g/dL (ref 6.5–8.1)

## 2023-08-17 LAB — GLUCOSE, CAPILLARY
Glucose-Capillary: 108 mg/dL — ABNORMAL HIGH (ref 70–99)
Glucose-Capillary: 112 mg/dL — ABNORMAL HIGH (ref 70–99)
Glucose-Capillary: 112 mg/dL — ABNORMAL HIGH (ref 70–99)
Glucose-Capillary: 122 mg/dL — ABNORMAL HIGH (ref 70–99)
Glucose-Capillary: 125 mg/dL — ABNORMAL HIGH (ref 70–99)
Glucose-Capillary: 127 mg/dL — ABNORMAL HIGH (ref 70–99)
Glucose-Capillary: 189 mg/dL — ABNORMAL HIGH (ref 70–99)
Glucose-Capillary: 351 mg/dL — ABNORMAL HIGH (ref 70–99)
Glucose-Capillary: 361 mg/dL — ABNORMAL HIGH (ref 70–99)
Glucose-Capillary: 380 mg/dL — ABNORMAL HIGH (ref 70–99)
Glucose-Capillary: 45 mg/dL — ABNORMAL LOW (ref 70–99)
Glucose-Capillary: 64 mg/dL — ABNORMAL LOW (ref 70–99)
Glucose-Capillary: 87 mg/dL (ref 70–99)
Glucose-Capillary: 94 mg/dL (ref 70–99)

## 2023-08-17 LAB — CBC WITH DIFFERENTIAL/PLATELET
Abs Immature Granulocytes: 0.05 10*3/uL (ref 0.00–0.07)
Basophils Absolute: 0 10*3/uL (ref 0.0–0.1)
Basophils Relative: 1 %
Eosinophils Absolute: 0.4 10*3/uL (ref 0.0–0.5)
Eosinophils Relative: 5 %
HCT: 33.1 % — ABNORMAL LOW (ref 36.0–46.0)
Hemoglobin: 10.2 g/dL — ABNORMAL LOW (ref 12.0–15.0)
Immature Granulocytes: 1 %
Lymphocytes Relative: 35 %
Lymphs Abs: 2.5 10*3/uL (ref 0.7–4.0)
MCH: 29.3 pg (ref 26.0–34.0)
MCHC: 30.8 g/dL (ref 30.0–36.0)
MCV: 95.1 fL (ref 80.0–100.0)
Monocytes Absolute: 0.6 10*3/uL (ref 0.1–1.0)
Monocytes Relative: 9 %
Neutro Abs: 3.5 10*3/uL (ref 1.7–7.7)
Neutrophils Relative %: 49 %
Platelets: 124 10*3/uL — ABNORMAL LOW (ref 150–400)
RBC: 3.48 MIL/uL — ABNORMAL LOW (ref 3.87–5.11)
RDW: 14.7 % (ref 11.5–15.5)
WBC: 7.1 10*3/uL (ref 4.0–10.5)
nRBC: 0 % (ref 0.0–0.2)

## 2023-08-17 LAB — CERULOPLASMIN: Ceruloplasmin: 26 mg/dL (ref 14–48)

## 2023-08-17 LAB — ANTI-SMOOTH MUSCLE ANTIBODY, IGG: Actin (Smooth Muscle) Antibody (IGG): <20 U (ref <20)

## 2023-08-17 LAB — TRIGLYCERIDES
Triglycerides: 1412 mg/dL — ABNORMAL HIGH (ref <150)
Triglycerides: 1256 mg/dL — ABNORMAL HIGH (ref <150)

## 2023-08-17 LAB — MAGNESIUM: Magnesium: 2.2 mg/dL (ref 1.7–2.4)

## 2023-08-17 LAB — MITOCHONDRIAL ANTIBODIES: Mitochondrial M2 Ab, IgG: <=20 U (ref <=20.0)

## 2023-08-17 MED ORDER — INSULIN (MYXREDLIN) INFUSION FOR HYPERTRIGLYCERIDEMIA
0.2000 [IU]/kg/h | INTRAVENOUS | Status: DC
  Administered 2023-08-17: 0.2 [IU]/kg/h via INTRAVENOUS
  Administered 2023-08-17: 0.1 [IU]/kg/h via INTRAVENOUS
  Administered 2023-08-18 – 2023-08-20 (×7): 0.2 [IU]/kg/h via INTRAVENOUS
  Filled 2023-08-17 (×8): qty 100

## 2023-08-17 MED ORDER — HYDRALAZINE HCL 50 MG PO TABS
75.0000 mg | ORAL_TABLET | Freq: Three times a day (TID) | ORAL | Status: DC
  Administered 2023-08-17 – 2023-08-21 (×11): 75 mg via ORAL
  Filled 2023-08-17 (×12): qty 1

## 2023-08-17 MED ORDER — DEXTROSE 10 % IV SOLN: INTRAVENOUS | Status: AC

## 2023-08-17 NOTE — Consult Note (Addendum)
 Cardiology Consultation   Patient ID: [name] MRN: [id number]; DOB: 04/22/1990  Admit date: 08/15/2023 Date of Consult: 08/17/2023  PCP:  Calton Catholic, PA-C   [facility] Providers Cardiologist:  Knox Perl, MD   {  Patient Profile:   Jennifer [name] is a 33 y.o. female with a hx of CAD with RCA occlusion cath 2022, nonischemic cardiomyopathy, cardio mems implant 2023, pulmonary hypertension, TR astrocytoma status postresection in 1997 with residual left-sided weakness, CKD stage IV, severe hypertriglyceridemia,  chronic pancreatitis, NASH, type 2 diabetes, hypertension, hypothyroidism, PCOS, OSA awaiting CPAP in who is being seen 08/17/2023 for the evaluation of abnormal echocardiogram at the request of Dr. Hildy Lowers.  History of Present Illness:   Jennifer [name] is followed by the advanced heart failure clinic.  EF has been as low as 20 to 25% in September 2022 when she was had abdominal CT that was concerning for vasculitis and hypertriglyceridemia.Cardiac MRI ordered and consistent with myocarditis versus sarcoid.  Underwent right and left heart catheterization demonstrating single-vessel RCA occlusion.  Findings did not entirely explain reduced EF so felt to be nonischemic.  LifeVest removed in October 2022.  EF since then has been mildly reduced to normal.  She had significant TR on right heart cath 07/2021 with normal cardiac output.  Mild to moderate mixed pulmonary hypertension with prominent V waves.  She has other complex noncardiac related comorbidities with multiple hospitalizations for hyperglycemia, hypertriglyceridemia and uncontrolled hypertension this year.  She was last seen in office earlier this month on May 12 overall feeling fine and compliant with all of her medications.  [name] followed by Dr. Maximo Spar for her hypertriglyceridemia.  Previously felt related to lipoprotein lipase deficiency but evaluated by [facility] endocrinology who performed exome gene  sequencing that failed to identify root cause.  Currently patient being evaluated for abdominal pain, hypertriglyceridemia, acute on chronic pancreatitis.  Started on IV insulin  drip in the ICU and has had downtrending triglycerides.  Cardiology was consulted for abnormal echocardiogram, EF 45 to 50% with global hypokinesis, normal RV.  Patient right now without any significant complaints.  Her heart failure has been well-controlled and she exhibits no significant symptoms of shortness of breath, chest pain, orthopnea, peripheral edema.  She remains compliant on all of her medications and is tolerating everything well.  Reports not being very active and living at home by herself but reports no exertional symptoms or decrease in exercise capacity.  Potassium 3.8, creatinine 2.0.  Mildly elevated AST/ALT.  Triglycerides 1400 and downtrending   Past Medical History:  Diagnosis Date   Afib (HCC) 05/12/2021   Brain tumor (HCC) 03/29/1995   astrocytoma   CHF (congestive heart failure) (HCC)    Cholesterosis    CKD (chronic kidney disease) stage 4, GFR 15-29 ml/min (HCC) 05/13/2021   DM (diabetes mellitus) (HCC) 10/10/2018   Fatty liver    Gastroparesis due to DM (HCC) 05/12/2021   HTN (hypertension) 10/10/2018   Hypothyroidism 10/10/2018   Lipoprotein deficiency    Lung disease    longevity long term   Pancreatitis    Polycystic ovary syndrome     Past Surgical History:  Procedure Laterality Date   ABDOMINAL SURGERY     pt states during miscarriage got her intestine   BRAIN SURGERY     EYE MUSCLE SURGERY Right 03/28/2014   PRESSURE SENSOR/CARDIOMEMS N/A 12/06/2021   Procedure: PRESSURE SENSOR/CARDIOMEMS;  Surgeon: Mardell Shade, MD;  Location: [facility];  Service: Cardiovascular;  Laterality: N/A;   RIGHT HEART CATH N/A 08/05/2021   Procedure: RIGHT HEART CATH;  Surgeon: Mardell Shade, MD;  Location: [facility];  Service: Cardiovascular;  Laterality: N/A;    RIGHT HEART CATH N/A 12/06/2021   Procedure: RIGHT HEART CATH;  Surgeon: Mardell Shade, MD;  Location: [facility];  Service: Cardiovascular;  Laterality: N/A;   RIGHT/LEFT HEART CATH AND CORONARY ANGIOGRAPHY N/A 12/03/2020   Procedure: RIGHT/LEFT HEART CATH AND CORONARY ANGIOGRAPHY;  Surgeon: Knox Perl, MD;  Location: [facility];  Service: Cardiovascular;  Laterality: N/A;   VENTRICULOSTOMY  03/28/1997     Inpatient Medications: Scheduled Meds:  amitriptyline   10 mg Oral QHS   amLODipine   5 mg Oral Daily   aspirin  EC  81 mg Oral Daily   azelastine   2 spray Each Nare Q0600   calcitRIOL  0.25 mcg Oral Daily   carvedilol   9.375 mg Oral BID   Chlorhexidine  Gluconate Cloth  6 each Topical QHS   DULoxetine   60 mg Oral QHS   enoxaparin  (LOVENOX ) injection  30 mg Subcutaneous Q24H   ivabradine   5 mg Oral BID WC   And   ivabradine   2.5 mg Oral BID WC   levothyroxine   150 mcg Oral Q0600   lipase/protease/amylase  36,000 Units Oral TID AC   loratadine   10 mg Oral QPM   metoCLOPramide  (REGLAN ) injection  5 mg Intravenous Q6H   pantoprazole   40 mg Oral Daily   pregabalin   300 mg Oral BID   sodium bicarbonate   1,300 mg Oral BID   topiramate   50 mg Oral BID   torsemide   60 mg Oral Daily   [START ON 08/20/2023] Vitamin D  (Ergocalciferol )  50,000 Units Oral Q7 days   Continuous Infusions:  famotidine  (PEPCID ) IV Stopped (08/17/23 0713)   insulin  0.1 Units/kg/hr (08/17/23 0507)   PRN Meds: acetaminophen  **OR** acetaminophen , butalbital -acetaminophen -caffeine , camphor-menthol, diphenhydrAMINE , hydrALAZINE , HYDROmorphone  (DILAUDID ) injection, mouth rinse, polyethylene glycol, prochlorperazine   Allergies:    Allergies  Allergen Reactions   Icosapent  Ethyl (Epa Ethyl Ester) (Fish) Hives   Ketamine Other (See Comments)    In vegetative state for 15 minutes per pt   Maitake Itching    Itchy throat   Morphine  Hives, Itching, Rash and Other (See Comments)    Sorethroat    Penicillins Hives, Itching and Rash    Has patient had a PCN reaction causing immediate rash, facial/tongue/throat swelling, SOB or lightheadedness with hypotension: Y Has patient had a PCN reaction causing severe rash involving mucus membranes or skin necrosis: Y Has patient had a PCN reaction that required hospitalization: N Has patient had a PCN reaction occurring within the last 10 years: Y   Mushroom Other (See Comments)    Pt has never had mushrooms but tested positive on allergy test   Shellfish Allergy Other (See Comments)    Pt has never had shellfish but tested positive on allergy test. Pt states contrast in CT is okay   Fentanyl  Nausea And Vomiting   Prednisone Rash    Social History:   Social History   Socioeconomic History   Marital status: Divorced    Spouse name: Not on file   Number of children: 0   Years of education: Not on file   Highest education level: Not on file  Occupational History   Occupation: Dance movement psychotherapist   Occupation: N/A  Tobacco Use   Smoking status: Never   Smokeless tobacco: Never  Vaping Use  Vaping status: Never Used  Substance and Sexual Activity   Alcohol  use: Never   Drug use: Never   Sexual activity: Yes  Other Topics Concern   Not on file  Social History Narrative   Not on file   Social Drivers of Health   Financial Resource Strain: Low Risk  (05/31/2023)   Received from [facility]   Overall Financial Resource Strain (CARDIA)    Difficulty of Paying Living Expenses: Not hard at all  Food Insecurity: No Food Insecurity (08/15/2023)   Hunger Vital Sign    Worried About Running Out of Food in the Last Year: Never true    Ran Out of Food in the Last Year: Never true  Transportation Needs: No Transportation Needs (08/15/2023)   PRAPARE - Administrator, Civil Service (Medical): No    Lack of Transportation (Non-Medical): No  Physical Activity: Unknown (05/31/2023)   Received from [facility]   Exercise Vital Sign     Days of Exercise per Week: Patient declined    Minutes of Exercise per Session: 0 min  Stress: No Stress Concern Present (05/31/2023)   Received from [facility] - Occupational Stress Questionnaire    Feeling of Stress : Not at all  Social Connections: Socially Integrated (05/31/2023)   Received from [facility]   Social Network    How would you rate your social network (family, work, friends)?: Good participation with social networks  Intimate Partner Violence: Not At Risk (08/15/2023)   Humiliation, Afraid, Rape, and Kick questionnaire    Fear of Current or Ex-Partner: No    Emotionally Abused: No    Physically Abused: No    Sexually Abused: No    Family History:   Family History  Problem Relation Age of Onset   Diabetes Mother    Hypertension Mother    Hyperlipidemia Mother    Thyroid  disease Mother    Hypertension Father    Diabetes Father    Pancreatic cancer Paternal Aunt    Pancreatic cancer Paternal Uncle    Colon cancer Neg Hx    Esophageal cancer Neg Hx    Stomach cancer Neg Hx      ROS:  Please see the history of present illness.  All other ROS reviewed and negative.     Physical Exam/Data:   Vitals:   08/17/23 0000 08/17/23 0200 08/17/23 0324 08/17/23 0400  BP: (!) 177/93 134/73  133/61  Pulse: (!) 122 (!) 117  (!) 104  Resp: 18 19  19   Temp: 98.7 F (37.1 C)  98.4 F (36.9 C)   TempSrc: Oral  Oral   SpO2: 96% 93%  93%  Weight:      Height:        Intake/Output Summary (Last 24 hours) at 08/17/2023 0934 Last data filed at 08/17/2023 0507 Gross per 24 hour  Intake 1153.49 ml  Output 1125 ml  Net 28.49 ml      08/15/2023    5:44 PM 08/14/2023   10:27 AM 08/07/2023    1:21 PM  Last 3 Weights  Weight (lbs) 153 lb 7 oz 149 lb 143 lb  Weight (kg) 69.6 kg 67.586 kg 64.864 kg     Body mass index is 33.2 kg/m.  General:  Well nourished, well developed, in no acute distress HEENT: normal Neck: no  JVD Vascular: No carotid bruits; Distal pulses 2+ bilaterally Cardiac:  normal S1, S2; RRR; 3/6 murmur Lungs:  clear to auscultation bilaterally, no wheezing, rhonchi or rales  Abd: soft, nontender, no hepatomegaly  Ext: no edema Musculoskeletal:  No deformities, BUE and BLE strength normal and equal Skin: warm and dry  Neuro:  CNs 2-12 intact, no focal abnormalities noted Psych:  Normal affect   EKG:  The EKG was personally reviewed and demonstrates: Sinus tachycardia, heart rate in the low 100s.  Nonspecific T wave changes noted throughout. Telemetry:  Telemetry was personally reviewed and demonstrates: Sinus rhythm times tachycardia with heart rates in the 120 - but improving.  Relevant CV Studies: Echocardiogram 08/16/2023 1. Left ventricular ejection fraction, by estimation, is 45 to 50%. The  left ventricle has mildly decreased function. The left ventricle  demonstrates global hypokinesis. Left ventricular diastolic function could  not be evaluated.   2. Right ventricular systolic function is normal. The right ventricular  size is normal. There is normal pulmonary artery systolic pressure.   3. The mitral valve is normal in structure. Trivial mitral valve  regurgitation. No evidence of mitral stenosis.   4. The aortic valve is tricuspid. Aortic valve regurgitation is not  visualized. No aortic stenosis is present.   5. The inferior vena cava is normal in size with <50% respiratory  variability, suggesting right atrial pressure of 8 mmHg.   Laboratory Data:  High Sensitivity Troponin:  No results for input(s): "TROPONINIHS" in the last 720 hours.   Chemistry Recent Labs  Lab 08/15/23 0641 08/16/23 0340 08/17/23 0327  NA 136 134* 137  K 3.6 3.6 3.8  CL 99 102 104  CO2 23 19* 22  GLUCOSE 75 87 88  BUN 47* 48* 37*  CREATININE 2.26* 2.35* 2.00*  CALCIUM  9.4 8.5* 8.9  MG  --   --  2.2  GFRNONAA 29* 28* 33*  ANIONGAP 14 13 11     Recent Labs  Lab 08/15/23 0641  08/16/23 0340 08/17/23 0327  PROT 7.6 6.9 7.1  ALBUMIN  4.0 3.8 3.9  AST 87* 85* 83*  ALT 117* 98* 94*  ALKPHOS 98 84 84  BILITOT 0.9 0.7 0.8   Lipids  Recent Labs  Lab 08/17/23 0327  TRIG 1,412*    Hematology Recent Labs  Lab 08/15/23 0641 08/16/23 0340 08/17/23 0327  WBC 6.9 8.6 7.1  RBC 3.98 3.50* 3.48*  HGB 12.2 10.6* 10.2*  HCT 37.1 33.9* 33.1*  MCV 93.2 96.9 95.1  MCH 30.7 30.3 29.3  MCHC 32.9 31.3 30.8  RDW 14.1 14.6 14.7  PLT 151 123* 124*   Thyroid  No results for input(s): "TSH", "FREET4" in the last 168 hours.  BNPNo results for input(s): "BNP", "PROBNP" in the last 168 hours.  DDimer No results for input(s): "DDIMER" in the last 168 hours.   Radiology/Studies:  ECHOCARDIOGRAM COMPLETE Result Date: 08/16/2023    ECHOCARDIOGRAM REPORT   Patient Name:   Jennifer [name] Date of Exam: 08/16/2023 Medical Rec #:  [id number]         Height:       57.0 in Accession #:    [id number]        Weight:       153.4 lb Date of Birth:  [birth year]         BSA:          1.607 m Patient Age:    33 years          BP:           143/75 mmHg Patient Gender: F  HR:           112 bpm. Exam Location:  Inpatient Procedure: 2D Echo, Cardiac Doppler, Color Doppler and Intracardiac            Opacification Agent (Both Spectral and Color Flow Doppler were            utilized during procedure). Indications:    Congestive Heart Failure I50.9  History:        Patient has prior history of Echocardiogram examinations, most                 recent 06/10/2022. CHF, Arrythmias:Atrial Fibrillation; Risk                 Factors:Hypertension and Diabetes.  Sonographer:    Astrid Blamer Referring Phys: [id number] DAVID MANUEL ORTIZ IMPRESSIONS  1. Left ventricular ejection fraction, by estimation, is 45 to 50%. The left ventricle has mildly decreased function. The left ventricle demonstrates global hypokinesis. Left ventricular diastolic function could not be evaluated.  2. Right ventricular systolic  function is normal. The right ventricular size is normal. There is normal pulmonary artery systolic pressure.  3. The mitral valve is normal in structure. Trivial mitral valve regurgitation. No evidence of mitral stenosis.  4. The aortic valve is tricuspid. Aortic valve regurgitation is not visualized. No aortic stenosis is present.  5. The inferior vena cava is normal in size with <50% respiratory variability, suggesting right atrial pressure of 8 mmHg. FINDINGS  Left Ventricle: Left ventricular ejection fraction, by estimation, is 45 to 50%. The left ventricle has mildly decreased function. The left ventricle demonstrates global hypokinesis. The left ventricular internal cavity size was normal in size. There is  no left ventricular hypertrophy. Left ventricular diastolic function could not be evaluated. Right Ventricle: The right ventricular size is normal. No increase in right ventricular wall thickness. Right ventricular systolic function is normal. There is normal pulmonary artery systolic pressure. The tricuspid regurgitant velocity is 2.23 m/s, and  with an assumed right atrial pressure of 8 mmHg, the estimated right ventricular systolic pressure is 27.9 mmHg. Left Atrium: Left atrial size was normal in size. Right Atrium: Right atrial size was normal in size. Pericardium: There is no evidence of pericardial effusion. Mitral Valve: The mitral valve is normal in structure. Trivial mitral valve regurgitation. No evidence of mitral valve stenosis. Tricuspid Valve: The tricuspid valve is normal in structure. Tricuspid valve regurgitation is trivial. No evidence of tricuspid stenosis. Aortic Valve: The aortic valve is tricuspid. Aortic valve regurgitation is not visualized. No aortic stenosis is present. Aortic valve mean gradient measures 5.0 mmHg. Aortic valve peak gradient measures 8.0 mmHg. Aortic valve area, by VTI measures 1.82 cm. Pulmonic Valve: The pulmonic valve was normal in structure. Pulmonic valve  regurgitation is not visualized. No evidence of pulmonic stenosis. Aorta: The aortic root is normal in size and structure. Venous: The inferior vena cava is normal in size with less than 50% respiratory variability, suggesting right atrial pressure of 8 mmHg. IAS/Shunts: No atrial level shunt detected by color flow Doppler.  LEFT VENTRICLE PLAX 2D LVIDd:         4.50 cm   Diastology LVIDs:         3.50 cm   LV e' lateral:   7.94 cm/s LV PW:         1.00 cm   LV E/e' lateral: 13.2 LV IVS:        0.85 cm LVOT diam:  1.80 cm LV SV:         45 LV SV Index:   28 LVOT Area:     2.54 cm  RIGHT VENTRICLE RV S prime:     12.40 cm/s TAPSE (M-mode): 1.9 cm LEFT ATRIUM             Index        RIGHT ATRIUM           Index LA Vol (A2C):   23.7 ml 14.75 ml/m  RA Area:     11.50 cm LA Vol (A4C):   32.1 ml 19.97 ml/m  RA Volume:   26.90 ml  16.74 ml/m LA Biplane Vol: 27.7 ml 17.24 ml/m  AORTIC VALVE AV Area (Vmax):    1.89 cm AV Area (Vmean):   1.81 cm AV Area (VTI):     1.82 cm AV Vmax:           141.00 cm/s AV Vmean:          113.000 cm/s AV VTI:            0.245 m AV Peak Grad:      8.0 mmHg AV Mean Grad:      5.0 mmHg LVOT Vmax:         105.00 cm/s LVOT Vmean:        80.500 cm/s LVOT VTI:          0.175 m LVOT/AV VTI ratio: 0.71  AORTA Ao Root diam: 2.00 cm MITRAL VALVE                TRICUSPID VALVE MV Area (PHT): 8.15 cm     TR Peak grad:   19.9 mmHg MV E velocity: 105.00 cm/s  TR Vmax:        223.00 cm/s MV A velocity: 44.60 cm/s MV E/A ratio:  2.35         SHUNTS                             Systemic VTI:  0.18 m                             Systemic Diam: 1.80 cm Jennifer Sos MD Electronically signed by Jennifer Sos MD Signature Date/Time: 08/16/2023/12:05:38 PM    Final    US  Abdomen Limited Result Date: 08/15/2023 CLINICAL DATA:  33 year old female with history of chronic pancreatitis, epigastric pain, and gastroparesis, with new onset ascites. IR requested for diagnostic and therapeutic  paracentesis. EXAM: ULTRASOUND ABDOMEN LIMITED COMPARISON:  CT abdomen pelvis with contrast on 06/21/2023 FINDINGS: Limited ultrasound done of all 4 quadrants. There is only small volume of ascites present. There is no pocket of fluid large enough to allow for safe approach for paracentesis. IMPRESSION: Small volume ascites without pocket of fluid large enough to allow for safe approach for paracentesis. Ultrasound performed by: Lambert Pillion, PA-C Electronically Signed   By: Creasie Doctor M.D.   On: 08/15/2023 15:52     Assessment and Plan:   Nonischemic cardiomyopathy with mildly reduced EF -Previous EF 25 to 30% in 2022 -EF 50 to 55%, normal RV July 2024, most recent. Cardiology initially consulted for abnormal echocardiogram, results show EF 45 to 50%, global hypokinesis, normal RV.  Normal PASP.  No significant valvular disease.  Do not see any significant abnormalities.  Global hypokinesis is expected with mildly reduced EF.  EF seems to be stable for the past couple years.  Appears to be euvolemic and without evidence of recurrence. GDMT: Coreg  9.375 mg twice daily, torsemide  60 mg, ivabradine  7.5 mg twice daily.  Will continue home hydralazine  75 mg 3 times daily. Did not tolerate spironolactone  due to hyperkalemia, no Jardiance with frequent GU infections.  CKD limits further titration. She has CardioMEMS at home.  CAD Cardiac catheterization 2022 demonstrating single-vessel RCA occlusion.  No chest pain. Continue with aspirin , Coreg , no statin elevated TG  Hypertension Previously poorly controlled but reasonable this admission.  Previous concerns of noncompliance but reports compliance with all of her patients now. Continue the above medications including amlodipine  5 mg  Severe hypertriglyceridemia She follows with Dr. Maximo Spar, TG is downtrending from 1400 now.  Further management per primary team.  Type IIIc diabetes Secondary to chronic pancreatitis Followed by endocrinology at  [facility].  On insulin  drip for the above as well.  Previous history of pulm hypertension and TR Noted on previous right heart catheterization in 2023.  No longer seems to be evident on recent imaging.  CKD  Creatinine 2 which is around baseline.  Risk Assessment/Risk Scores:      [place]  Heart Association (NYHA) Functional Class NYHA Class II        For questions or updates, please contact [facility] Please consult [email or web address] for contact info under    Signed, Burnetta Cart, PA-C  08/17/2023 9:34 AM   Patient seen and examined.  Agree with above documentation.  Jennifer [name] is a 33 year old female with history of CAD, chronic combined heart failure, pulmonary hypertension CKD stage IV, chronic pancreatitis, severe hypertriglyceridemia, hypothyroidism, hypertension, OSA we are consulted for evaluation of heart failure at the request of Dr. Hildy Lowers.  She follows with advanced heart failure, has had EF as low as 20-25% in 2022.  Cardiac MRI at that time showed nonischemic scar pattern with differential including prior myocarditis versus sarcoidosis.  LHC showed occluded RCA.  Echocardiogram 05/2022 showed EF 45 to 50%.  Echocardiogram 09/2022 showed EF 50 to 55%.  [name] currently admitted with acute on chronic pancreatitis, hypertriglyceridemia.  In ED, labs notable for creatinine 2.26, ALT 117, AST 87, triglyceride 2288.  She was started on IV insulin  with improvement.  Echo this admission shows EF 45 to 50%, normal RV function, no significant valvular disease.   On exam, patient is alert and oriented, regular rate and rhythm, no murmurs, lungs CTAB, no LE edema.  For her chronic systolic heart failure, EF appears stable from prior.  She appears euvolemic.  Will continue home heart failure meds.  Wendie Hamburg, MD

## 2023-08-17 NOTE — Plan of Care (Signed)

## 2023-08-17 NOTE — Progress Notes (Signed)
 PROGRESS NOTE    Jennifer [name]  WGN:[id number] DOB: 03-Mar-1991 DOA: 08/15/2023 PCP: Calton Catholic, PA-C    Chief Complaint  Patient presents with   Abdominal Pain    Brief Narrative:  Patient is a 33 year old female history of paroxysmal A-fib, astrocytoma, chronic systolic CHF with recovered EF, history of prolonged QT interval, lipoprotein deficiency, familial hypercholesterolemia, cholesterolosis, NASH, stage IV CKD, class I obesity, type 2 diabetes, hypertension hypothyroidism, recurrent pancreatitis, gastroparesis, peripheral neuropathy, history of pancytopenia, GERD, PCOS presented to the ED with severe abdominal pain nausea dry heaves.  Patient stated abdominal pain occurred when she woke up and took a small sip of juice which triggered her pain immediately.  Patient also noted with some loose stools.  Patient seen in the ED, CBC, lipase, serum pregnancy test unremarkable.  CMP with a BUN of 47, creatinine of 2.26, AST of 87, ALT of 117.  Patient noted to have a significantly elevated triglyceride level of 2288 patient admitted to stepdown unit, placed on the insulin  drip due to concerns for possible pancreatitis induced hypertriglyceridemia.   Assessment & Plan:   Principal Problem:   Intractable abdominal pain Active Problems:   Familial hypertriglyceridemia   Type 2 diabetes mellitus with hyperlipidemia (HCC)   Hypothyroidism   Chronic systolic CHF (congestive heart failure) (HCC)   HTN (hypertension)   CKD (chronic kidney disease) stage 4, GFR 15-29 ml/min (HCC)   Pulmonary hypertension (HCC)   NASH (nonalcoholic steatohepatitis)   Obesity (BMI 30-39.9)   Gastroparesis due to DM [facility])   Hepatic steatosis   Coronary artery disease involving native coronary artery of native heart without angina pectoris   Nocturnal hypoxemia   Chronic pancreatitis (HCC)  #1 familial hypertriglyceridemia/acute on chronic pancreatitis secondary to hypertriglyceridemia - Patient noted  to have presented with sudden onset epigastric abdominal pain nausea, dry heaves, patient with a history of chronic pancreatitis. - Triglyceride level noted at 2288 on admission which is trending down currently at 1412 from 1534 from 1761 from 2288 on admission. - Lipase level noted at 26. - LFTs noted to be elevated but trending down, patient with history of NASH. - Right upper quadrant ultrasound done with small volume ascites without pocket of fluid large enough to allow for safe approach for paracentesis. - Patient with some clinical improvement. - Continue IV insulin  drip and increased rate, until triglycerides level < 500. - Continue D10 infusion and decrease rate to 25 cc an hour. - Patient was placed on a clear liquid diet advance to full liquid diet which she tolerated and was advanced to a carb modified diet.  - Check triglycerides levels every 12 hours. - Once triglyceride level <500 we will discontinue insulin  drip and placed on home regimen of fenofibrate  and statin/Crestor . -Change CBGs to every 4 hours. - Supportive care.  2.  Gastroparesis due to diabetes mellitus - Continue IV Reglan  5 mg every 6 hours and once tolerating the diet with clinical improvement could transition to oral Reglan  in the next 24 to 48 hours.  3.  NASH/hepatic steatosis -Outpatient follow-up with gastroenterology.  4.  Diabetes mellitus type 2 with hyperglycemia with long-term insulin  use -Hemoglobin A1c 6.1 (2/8/20250 - CBG noted at 94 this morning. - Patient on insulin  drip secondary to problem #1. - Change CBGs to every 4 hours. - Continue home regimen Lyrica .  5.  Hypertension - Continue home regimen of Norvasc , Coreg , Demadex .  6.  Pulmonary hypertension - Continue home medications.  7.  CKD stage IV -  Stable. - Continue home regimen of bicarb tablets, Demadex , calcitriol.  8.  CAD/chronic systolic CHF - Continue home cardiac regimen of aspirin , amlodipine , carvedilol , Demadex . - Hold  fenofibrate  and statin until triglyceride level is < 500. - 2D echo obtained with a EF of 45 to 50%, left ventricular global hypokinesis. - Cardiology consulted due to abnormal 2D echo for further evaluation and recommendations.   9. nocturnal hypoxemia -Continue O2 at bedtime.  10.  Obesity -BMI 32.24 kg/m. - Lifestyle modification. - Outpatient follow-up with PCP.  11.  Hypothyroidism - Continue Synthroid .    DVT prophylaxis: Lovenox  Code Status: Full Family Communication: Updated patient.  No family at bedside. Disposition: Likely home when clinically improved and back to baseline.  Status is: Inpatient The patient will require care spanning > 2 midnights and should be moved to inpatient because: Severity of illness   Consultants:  Cardiology  Procedures:  Right upper quadrant ultrasound 08/15/2023 2D echo 08/16/2023   Antimicrobials: Anti-infectives (From admission, onward)    None         Subjective: Patient sitting up in recliner.  Overall feels well.  Denies any shortness of breath, no chest pain, no abdominal pain.  Tolerated full liquid diet.   Objective: Vitals:   08/17/23 0000 08/17/23 0200 08/17/23 0324 08/17/23 0400  BP: (!) 177/93 134/73  133/61  Pulse: (!) 122 (!) 117  (!) 104  Resp: 18 19  19   Temp: 98.7 F (37.1 C)  98.4 F (36.9 C)   TempSrc: Oral  Oral   SpO2: 96% 93%  93%  Weight:      Height:        Intake/Output Summary (Last 24 hours) at 08/17/2023 1039 Last data filed at 08/17/2023 0507 Gross per 24 hour  Intake 1153.49 ml  Output 1125 ml  Net 28.49 ml   Filed Weights   08/15/23 1744  Weight: 69.6 kg    Examination:  General exam: Appears calm and comfortable  Respiratory system: Lungs clear to auscultation bilaterally.  No wheezes, no crackles, no rhonchi.  Fair air movement.  Speaking in full sentences.   Cardiovascular system: Tachycardia.  No JVD, no murmurs rubs or gallops.  No lower extremity edema.   Gastrointestinal system: Abdomen is soft, nontender, mildly distended, positive bowel sounds.  No rebound.  No guarding.  Central nervous system: Alert and oriented. No focal neurological deficits. Extremities: Symmetric 5 x 5 power. Skin: No rashes, lesions or ulcers Psychiatry: Judgement and insight appear normal. Mood & affect appropriate.     Data Reviewed: I have personally reviewed following labs and imaging studies  CBC: Recent Labs  Lab 08/14/23 1124 08/15/23 0641 08/16/23 0340 08/17/23 0327  WBC 4.9 6.9 8.6 7.1  NEUTROABS 2.0  --   --  3.5  HGB 12.3 12.2 10.6* 10.2*  HCT 36.2 37.1 33.9* 33.1*  MCV 88.8 93.2 96.9 95.1  PLT 144.0* 151 123* 124*    Basic Metabolic Panel: Recent Labs  Lab 08/14/23 1124 08/15/23 0641 08/16/23 0340 08/17/23 0327  NA 136 136 134* 137  K 3.7 3.6 3.6 3.8  CL 99 99 102 104  CO2 26 23 19* 22  GLUCOSE 93 75 87 88  BUN 40* 47* 48* 37*  CREATININE 1.94* 2.26* 2.35* 2.00*  CALCIUM  9.8 9.4 8.5* 8.9  MG  --   --   --  2.2    GFR: Estimated Creatinine Clearance: 32.5 mL/min (A) (by C-G formula based on SCr of 2 mg/dL (H)).  Liver Function Tests: Recent Labs  Lab 08/14/23 1124 08/15/23 0641 08/16/23 0340 08/17/23 0327  AST 120* 87* 85* 83*  ALT 128* 117* 98* 94*  ALKPHOS 96 98 84 84  BILITOT 0.6 0.9 0.7 0.8  PROT 7.8 7.6 6.9 7.1  ALBUMIN  4.5 4.0 3.8 3.9    CBG: Recent Labs  Lab 08/17/23 0301 08/17/23 0459 08/17/23 0623 08/17/23 0822 08/17/23 0949  GLUCAP 87 122* 94 112* 125*     Recent Results (from the past 240 hours)  MRSA Next Gen by PCR, Nasal     Status: None   Collection Time: 08/15/23  5:21 PM   Specimen: Nasal Mucosa; Nasal Swab  Result Value Ref Range Status   MRSA by PCR Next Gen NOT DETECTED NOT DETECTED Final    Comment: (NOTE) The GeneXpert MRSA Assay (FDA approved for NASAL specimens only), is one component of a comprehensive MRSA colonization surveillance program. It is not intended to diagnose  MRSA infection nor to guide or monitor treatment for MRSA infections. Test performance is not FDA approved in patients less than 11 years old. Performed at [facility], [address], [place], [place] [address]          Radiology Studies: ECHOCARDIOGRAM COMPLETE Result Date: 08/16/2023    ECHOCARDIOGRAM REPORT   Patient Name:   ROSSELYN MARTHA Date of Exam: 08/16/2023 Medical Rec #:  [id number]         Height:       57.0 in Accession #:    [id number]        Weight:       153.4 lb Date of Birth:  May 02, 1990         BSA:          1.607 m Patient Age:    33 years          BP:           143/75 mmHg Patient Gender: F                 HR:           112 bpm. Exam Location:  Inpatient Procedure: 2D Echo, Cardiac Doppler, Color Doppler and Intracardiac            Opacification Agent (Both Spectral and Color Flow Doppler were            utilized during procedure). Indications:    Congestive Heart Failure I50.9  History:        Patient has prior history of Echocardiogram examinations, most                 recent 06/10/2022. CHF, Arrythmias:Atrial Fibrillation; Risk                 Factors:Hypertension and Diabetes.  Sonographer:    Astrid Blamer Referring Phys: [id number] DAVID MANUEL ORTIZ IMPRESSIONS  1. Left ventricular ejection fraction, by estimation, is 45 to 50%. The left ventricle has mildly decreased function. The left ventricle demonstrates global hypokinesis. Left ventricular diastolic function could not be evaluated.  2. Right ventricular systolic function is normal. The right ventricular size is normal. There is normal pulmonary artery systolic pressure.  3. The mitral valve is normal in structure. Trivial mitral valve regurgitation. No evidence of mitral stenosis.  4. The aortic valve is tricuspid. Aortic valve regurgitation is not visualized. No aortic stenosis is present.  5. The inferior vena cava is normal in size with <50% respiratory variability, suggesting right atrial pressure  of 8 mmHg. FINDINGS  Left Ventricle: Left ventricular ejection fraction, by estimation, is 45 to 50%. The left ventricle has mildly decreased function. The left ventricle demonstrates global hypokinesis. The left ventricular internal cavity size was normal in size. There is  no left ventricular hypertrophy. Left ventricular diastolic function could not be evaluated. Right Ventricle: The right ventricular size is normal. No increase in right ventricular wall thickness. Right ventricular systolic function is normal. There is normal pulmonary artery systolic pressure. The tricuspid regurgitant velocity is 2.23 m/s, and  with an assumed right atrial pressure of 8 mmHg, the estimated right ventricular systolic pressure is 27.9 mmHg. Left Atrium: Left atrial size was normal in size. Right Atrium: Right atrial size was normal in size. Pericardium: There is no evidence of pericardial effusion. Mitral Valve: The mitral valve is normal in structure. Trivial mitral valve regurgitation. No evidence of mitral valve stenosis. Tricuspid Valve: The tricuspid valve is normal in structure. Tricuspid valve regurgitation is trivial. No evidence of tricuspid stenosis. Aortic Valve: The aortic valve is tricuspid. Aortic valve regurgitation is not visualized. No aortic stenosis is present. Aortic valve mean gradient measures 5.0 mmHg. Aortic valve peak gradient measures 8.0 mmHg. Aortic valve area, by VTI measures 1.82 cm. Pulmonic Valve: The pulmonic valve was normal in structure. Pulmonic valve regurgitation is not visualized. No evidence of pulmonic stenosis. Aorta: The aortic root is normal in size and structure. Venous: The inferior vena cava is normal in size with less than 50% respiratory variability, suggesting right atrial pressure of 8 mmHg. IAS/Shunts: No atrial level shunt detected by color flow Doppler.  LEFT VENTRICLE PLAX 2D LVIDd:         4.50 cm   Diastology LVIDs:         3.50 cm   LV e' lateral:   7.94 cm/s LV PW:          1.00 cm   LV E/e' lateral: 13.2 LV IVS:        0.85 cm LVOT diam:     1.80 cm LV SV:         45 LV SV Index:   28 LVOT Area:     2.54 cm  RIGHT VENTRICLE RV S prime:     12.40 cm/s TAPSE (M-mode): 1.9 cm LEFT ATRIUM             Index        RIGHT ATRIUM           Index LA Vol (A2C):   23.7 ml 14.75 ml/m  RA Area:     11.50 cm LA Vol (A4C):   32.1 ml 19.97 ml/m  RA Volume:   26.90 ml  16.74 ml/m LA Biplane Vol: 27.7 ml 17.24 ml/m  AORTIC VALVE AV Area (Vmax):    1.89 cm AV Area (Vmean):   1.81 cm AV Area (VTI):     1.82 cm AV Vmax:           141.00 cm/s AV Vmean:          113.000 cm/s AV VTI:            0.245 m AV Peak Grad:      8.0 mmHg AV Mean Grad:      5.0 mmHg LVOT Vmax:         105.00 cm/s LVOT Vmean:        80.500 cm/s LVOT VTI:          0.175 m LVOT/AV VTI  ratio: 0.71  AORTA Ao Root diam: 2.00 cm MITRAL VALVE                TRICUSPID VALVE MV Area (PHT): 8.15 cm     TR Peak grad:   19.9 mmHg MV E velocity: 105.00 cm/s  TR Vmax:        223.00 cm/s MV A velocity: 44.60 cm/s MV E/A ratio:  2.35         SHUNTS                             Systemic VTI:  0.18 m                             Systemic Diam: 1.80 cm Maudine Sos MD Electronically signed by Maudine Sos MD Signature Date/Time: 08/16/2023/12:05:38 PM    Final    US  Abdomen Limited Result Date: 08/15/2023 CLINICAL DATA:  33 year old female with history of chronic pancreatitis, epigastric pain, and gastroparesis, with new onset ascites. IR requested for diagnostic and therapeutic paracentesis. EXAM: ULTRASOUND ABDOMEN LIMITED COMPARISON:  CT abdomen pelvis with contrast on 06/21/2023 FINDINGS: Limited ultrasound done of all 4 quadrants. There is only small volume of ascites present. There is no pocket of fluid large enough to allow for safe approach for paracentesis. IMPRESSION: Small volume ascites without pocket of fluid large enough to allow for safe approach for paracentesis. Ultrasound performed by: Lambert Pillion, PA-C  Electronically Signed   By: Creasie Doctor M.D.   On: 08/15/2023 15:52        Scheduled Meds:  amitriptyline   10 mg Oral QHS   amLODipine   5 mg Oral Daily   aspirin  EC  81 mg Oral Daily   azelastine   2 spray Each Nare Q0600   calcitRIOL  0.25 mcg Oral Daily   carvedilol   9.375 mg Oral BID   Chlorhexidine  Gluconate Cloth  6 each Topical QHS   DULoxetine   60 mg Oral QHS   enoxaparin  (LOVENOX ) injection  30 mg Subcutaneous Q24H   hydrALAZINE   75 mg Oral Q8H   ivabradine   5 mg Oral BID WC   And   ivabradine   2.5 mg Oral BID WC   levothyroxine   150 mcg Oral Q0600   lipase/protease/amylase  36,000 Units Oral TID AC   loratadine   10 mg Oral QPM   metoCLOPramide  (REGLAN ) injection  5 mg Intravenous Q6H   pantoprazole   40 mg Oral Daily   pregabalin   300 mg Oral BID   sodium bicarbonate   1,300 mg Oral BID   topiramate   50 mg Oral BID   torsemide   60 mg Oral Daily   [START ON 08/20/2023] Vitamin D  (Ergocalciferol )  50,000 Units Oral Q7 days   Continuous Infusions:  famotidine  (PEPCID ) IV Stopped (08/17/23 1027)   insulin  0.1 Units/kg/hr (08/17/23 0947)     LOS: 1 day    Time spent: 40 minutes    Hilda Lovings, MD [facility]   To contact the attending provider between 7A-7P or the covering provider during after hours 7P-7A, please log into the web site [email or web address] and access using universal [place] password for that web site. If you do not have the password, please call the hospital operator.  08/17/2023, 10:39 AM

## 2023-08-18 ENCOUNTER — Inpatient Hospital Stay (HOSPITAL_COMMUNITY)

## 2023-08-18 DIAGNOSIS — E781 Pure hyperglyceridemia: Secondary | ICD-10-CM | POA: Diagnosis not present

## 2023-08-18 DIAGNOSIS — N184 Chronic kidney disease, stage 4 (severe): Secondary | ICD-10-CM | POA: Diagnosis not present

## 2023-08-18 DIAGNOSIS — I5022 Chronic systolic (congestive) heart failure: Secondary | ICD-10-CM | POA: Diagnosis not present

## 2023-08-18 DIAGNOSIS — N179 Acute kidney failure, unspecified: Secondary | ICD-10-CM | POA: Diagnosis not present

## 2023-08-18 DIAGNOSIS — R109 Unspecified abdominal pain: Secondary | ICD-10-CM | POA: Diagnosis not present

## 2023-08-18 LAB — URINALYSIS, COMPLETE (UACMP) WITH MICROSCOPIC
Bacteria, UA: NONE SEEN
Bilirubin Urine: NEGATIVE
Glucose, UA: 50 mg/dL — AB
Hgb urine dipstick: NEGATIVE
Ketones, ur: NEGATIVE mg/dL
Leukocytes,Ua: NEGATIVE
Nitrite: NEGATIVE
Protein, ur: NEGATIVE mg/dL
Specific Gravity, Urine: 1.009 (ref 1.005–1.030)
pH: 5 (ref 5.0–8.0)

## 2023-08-18 LAB — GLUCOSE, CAPILLARY
Glucose-Capillary: 102 mg/dL — ABNORMAL HIGH (ref 70–99)
Glucose-Capillary: 112 mg/dL — ABNORMAL HIGH (ref 70–99)
Glucose-Capillary: 121 mg/dL — ABNORMAL HIGH (ref 70–99)
Glucose-Capillary: 140 mg/dL — ABNORMAL HIGH (ref 70–99)
Glucose-Capillary: 144 mg/dL — ABNORMAL HIGH (ref 70–99)
Glucose-Capillary: 166 mg/dL — ABNORMAL HIGH (ref 70–99)
Glucose-Capillary: 203 mg/dL — ABNORMAL HIGH (ref 70–99)
Glucose-Capillary: 246 mg/dL — ABNORMAL HIGH (ref 70–99)
Glucose-Capillary: 288 mg/dL — ABNORMAL HIGH (ref 70–99)
Glucose-Capillary: 347 mg/dL — ABNORMAL HIGH (ref 70–99)
Glucose-Capillary: 92 mg/dL (ref 70–99)

## 2023-08-18 LAB — COMPREHENSIVE METABOLIC PANEL WITH GFR
ALT: 81 U/L — ABNORMAL HIGH (ref 0–44)
AST: 56 U/L — ABNORMAL HIGH (ref 15–41)
Albumin: 3.7 g/dL (ref 3.5–5.0)
Alkaline Phosphatase: 89 U/L (ref 38–126)
Anion gap: 12 (ref 5–15)
BUN: 45 mg/dL — ABNORMAL HIGH (ref 6–20)
CO2: 22 mmol/L (ref 22–32)
Calcium: 8.8 mg/dL — ABNORMAL LOW (ref 8.9–10.3)
Chloride: 97 mmol/L — ABNORMAL LOW (ref 98–111)
Creatinine, Ser: 3.25 mg/dL — ABNORMAL HIGH (ref 0.44–1.00)
GFR, Estimated: 19 mL/min — ABNORMAL LOW (ref >60)
Glucose, Bld: 272 mg/dL — ABNORMAL HIGH (ref 70–99)
Potassium: 3.8 mmol/L (ref 3.5–5.1)
Sodium: 131 mmol/L — ABNORMAL LOW (ref 135–145)
Total Bilirubin: 0.5 mg/dL (ref 0.0–1.2)
Total Protein: 7.1 g/dL (ref 6.5–8.1)

## 2023-08-18 LAB — SODIUM, URINE, RANDOM: Sodium, Ur: 15 mmol/L

## 2023-08-18 LAB — TRIGLYCERIDES
Triglycerides: 1258 mg/dL — ABNORMAL HIGH (ref <150)
Triglycerides: 896 mg/dL — ABNORMAL HIGH (ref <150)

## 2023-08-18 LAB — CBC
HCT: 33 % — ABNORMAL LOW (ref 36.0–46.0)
Hemoglobin: 10.4 g/dL — ABNORMAL LOW (ref 12.0–15.0)
MCH: 30.8 pg (ref 26.0–34.0)
MCHC: 31.5 g/dL (ref 30.0–36.0)
MCV: 97.6 fL (ref 80.0–100.0)
Platelets: 112 10*3/uL — ABNORMAL LOW (ref 150–400)
RBC: 3.38 MIL/uL — ABNORMAL LOW (ref 3.87–5.11)
RDW: 15 % (ref 11.5–15.5)
WBC: 6.9 10*3/uL (ref 4.0–10.5)
nRBC: 0 % (ref 0.0–0.2)

## 2023-08-18 LAB — CREATININE, URINE, RANDOM: Creatinine, Urine: 108 mg/dL

## 2023-08-18 LAB — MAGNESIUM: Magnesium: 2.2 mg/dL (ref 1.7–2.4)

## 2023-08-18 MED ORDER — METOCLOPRAMIDE HCL 5 MG PO TABS
5.0000 mg | ORAL_TABLET | Freq: Three times a day (TID) | ORAL | Status: DC
  Administered 2023-08-18 – 2023-08-26 (×32): 5 mg via ORAL
  Filled 2023-08-18 (×32): qty 1

## 2023-08-18 MED ORDER — DEXTROSE 10 % IV SOLN
INTRAVENOUS | Status: DC
Start: 2023-08-18 — End: 2023-08-19

## 2023-08-18 MED ORDER — SODIUM CHLORIDE 0.9 % IV BOLUS
500.0000 mL | Freq: Once | INTRAVENOUS | Status: AC
  Administered 2023-08-18: 500 mL via INTRAVENOUS

## 2023-08-18 NOTE — Consult Note (Addendum)
 [facility] Renal Consultation Note  Requesting MD: Hilda Lovings, MD Indication for Consultation:  AKI   Chief complaint: abdominal pain  HPI:  Jennifer [name] is a 33 y.o. female with a history of astrocytoma, atrial fibrillation, chronic systolic congestive heart failure with recovered EF, NASH, and CKD stage IV who presented to the hospital with abdominal pain and nausea.  She was found to have hypertriglyceridemia felt induced by pancreatitis.  She has been treated with insulin  and D10.  Her creatinine has increased up to 3.25 from 2.0 the day prior.  Nephrology is consulted for assistance with management of AKI.  Strict ins/outs are not available.  She had 800 mL UOP over 5/22 as well as two unmeasured urine voids.  She had 1.1 liters UOP over 5/21.  She had a renal US  without hydro.  I recommended giving normal saline 500 mL once earlier today.  She states that she is having some trouble urinating.  [name] usually on torsemide  60 mg daily at home.  She follows with Dr. Edson Graces at our office.   Creatinine, Ser  Date/Time Value Ref Range Status  08/18/2023 03:22 AM 3.25 (H) 0.44 - 1.00 mg/dL Final    Comment:    DELTA CHECK NOTED  08/17/2023 03:27 AM 2.00 (H) 0.44 - 1.00 mg/dL Final  53/66/4403 47:42 AM 2.35 (H) 0.44 - 1.00 mg/dL Final  59/56/3875 64:33 AM 2.26 (H) 0.44 - 1.00 mg/dL Final  29/51/8841 66:06 AM 1.94 (H) 0.40 - 1.20 mg/dL Final  30/16/0109 32:35 PM 2.36 (H) 0.44 - 1.00 mg/dL Final  57/32/2025 42:70 PM 2.40 (H) 0.44 - 1.00 mg/dL Final  62/37/6283 15:17 PM 2.18 (H) 0.44 - 1.00 mg/dL Final  61/60/7371 06:26 PM 1.85 (H) 0.44 - 1.00 mg/dL Final  94/85/4627 03:50 PM 1.74 (H) 0.44 - 1.00 mg/dL Final  09/38/1829 93:71 PM 1.93 (H) 0.44 - 1.00 mg/dL Final  69/67/8938 10:17 PM 2.34 (H) 0.44 - 1.00 mg/dL Final  51/04/5850 77:82 PM 2.03 (H) 0.44 - 1.00 mg/dL Final  42/35/3614 43:15 PM 1.65 (H) 0.44 - 1.00 mg/dL Final  40/10/6759 95:09 PM 1.63 (H) 0.44 - 1.00  mg/dL Final  32/67/1245 80:99 PM 1.71 (H) 0.44 - 1.00 mg/dL Final  83/38/2505 39:76 AM 1.78 (H) 0.44 - 1.00 mg/dL Final  73/41/9379 02:40 PM 2.92 (H) 0.44 - 1.00 mg/dL Final  97/35/3299 24:26 AM 2.57 (H) 0.44 - 1.00 mg/dL Final  83/41/9622 29:79 AM 2.63 (H) 0.44 - 1.00 mg/dL Final  89/[birth year]/1941 74:08 AM 2.32 (H) 0.44 - 1.00 mg/dL Final  14/48/1856 31:49 AM 2.72 (H) 0.44 - 1.00 mg/dL Final    Comment:    POST-ULTRACENTRIFUGATION  05/06/2023 05:02 AM 2.40 (H) 0.44 - 1.00 mg/dL Final  70/26/3785 88:50 AM 1.88 (H) 0.44 - 1.00 mg/dL Final  27/74/1287 86:76 PM 1.92 (H) 0.44 - 1.00 mg/dL Final  72/11/4707 62:83 PM 2.10 (H) 0.44 - 1.00 mg/dL Final  66/29/4765 46:50 PM 2.10 (H) 0.44 - 1.00 mg/dL Final  35/46/5681 27:51 AM 1.83 (H) 0.44 - 1.00 mg/dL Final  70/03/7492 49:67 PM 2.23 (H) 0.44 - 1.00 mg/dL Final  59/16/3846 65:99 AM 2.03 (H) 0.44 - 1.00 mg/dL Final  35/70/1779 39:03 PM 2.14 (H) 0.44 - 1.00 mg/dL Final  00/92/3300 76:22 AM 1.70 (H) 0.44 - 1.00 mg/dL Final  63/33/5456 25:63 AM 2.40 (H) 0.44 - 1.00 mg/dL Final    Comment:    POST-ULTRACENTRIFUGATION  04/10/2023 08:32 PM 2.10 (H) 0.44 - 1.00 mg/dL Final  89/37/3428 76:81 PM 2.05 (  H) 0.44 - 1.00 mg/dL Final  60/63/0160 10:93 PM 1.93 (H) 0.44 - 1.00 mg/dL Final  23/55/7322 02:54 PM 1.75 (H) 0.44 - 1.00 mg/dL Final    Comment:    POST-ULTRACENTRIFUGATION HEMOLYSIS AT THIS LEVEL MAY AFFECT RESULT RESULT RELEASED PER NATALIE WARDEN RN   01/19/2023 09:45 AM 1.80 (H) 0.44 - 1.00 mg/dL Final  27/08/2374 28:31 AM 1.43 (H) 0.44 - 1.00 mg/dL Final  51/76/1607 37:10 PM 2.03 (H) 0.44 - 1.00 mg/dL Final    Comment:    POST-ULTRACENTRIFUGATION  01/18/2023 08:57 AM 2.11 (H) 0.44 - 1.00 mg/dL Final  62/69/4854 62:70 PM 1.04 (H) 0.44 - 1.00 mg/dL Final    Comment:    POST-ULTRACENTRIFUGATION  01/12/2023 09:30 PM 1.53 (H) 0.44 - 1.00 mg/dL Final  35/00/9381 82:99 AM 1.52 (H) 0.44 - 1.00 mg/dL Final  37/16/9678 93:81 AM 1.49 (H) 0.44 - 1.00 mg/dL  Final  01/75/1025 85:27 AM 1.96 (H) 0.44 - 1.00 mg/dL Final    Comment:    POST-ULTRACENTRIFUGATION  01/04/2023 05:50 AM 1.75 (H) 0.44 - 1.00 mg/dL Final  78/24/2353 61:44 AM 1.34 (H) 0.44 - 1.00 mg/dL Final  31/54/0086 76:19 PM 1.11 (H) 0.44 - 1.00 mg/dL Final    Comment:    POST-ULTRACENTRIFUGATION  01/02/2023 02:38 PM 1.50 (H) 0.44 - 1.00 mg/dL Final  50/93/2671 24:58 AM 1.44 (H) 0.44 - 1.00 mg/dL Final    Comment:    POST-ULTRACENTRIFUGATION  10/17/2022 09:30 PM 1.43 (H) 0.44 - 1.00 mg/dL Final     PMHx:   Past Medical History:  Diagnosis Date   Afib (HCC) 05/12/2021   Brain tumor (HCC) 03/29/1995   astrocytoma   CHF (congestive heart failure) (HCC)    Cholesterosis    CKD (chronic kidney disease) stage 4, GFR 15-29 ml/min (HCC) 05/13/2021   DM (diabetes mellitus) (HCC) 10/10/2018   Fatty liver    Gastroparesis due to DM (HCC) 05/12/2021   HTN (hypertension) 10/10/2018   Hypothyroidism 10/10/2018   Lipoprotein deficiency    Lung disease    longevity long term   Pancreatitis    Polycystic ovary syndrome     Past Surgical History:  Procedure Laterality Date   ABDOMINAL SURGERY     pt states during miscarriage got her intestine   BRAIN SURGERY     EYE MUSCLE SURGERY Right 03/28/2014   PRESSURE SENSOR/CARDIOMEMS N/A 12/06/2021   Procedure: PRESSURE SENSOR/CARDIOMEMS;  Surgeon: Mardell Shade, MD;  Location: [facility];  Service: Cardiovascular;  Laterality: N/A;   RIGHT HEART CATH N/A 08/05/2021   Procedure: RIGHT HEART CATH;  Surgeon: Mardell Shade, MD;  Location: [facility];  Service: Cardiovascular;  Laterality: N/A;   RIGHT HEART CATH N/A 12/06/2021   Procedure: RIGHT HEART CATH;  Surgeon: Mardell Shade, MD;  Location: [facility];  Service: Cardiovascular;  Laterality: N/A;   RIGHT/LEFT HEART CATH AND CORONARY ANGIOGRAPHY N/A 12/03/2020   Procedure: RIGHT/LEFT HEART CATH AND CORONARY ANGIOGRAPHY;  Surgeon: Knox Perl, MD;   Location: [facility];  Service: Cardiovascular;  Laterality: N/A;   VENTRICULOSTOMY  03/28/1997    Family Hx:  Family History  Problem Relation Age of Onset   Diabetes Mother    Hypertension Mother    Hyperlipidemia Mother    Thyroid  disease Mother    Hypertension Father    Diabetes Father    Pancreatic cancer Paternal Aunt    Pancreatic cancer Paternal Uncle    Colon cancer Neg Hx  Esophageal cancer Neg Hx    Stomach cancer Neg Hx     Social History:  reports that she has never smoked. She has never used smokeless tobacco. She reports that she does not drink alcohol  and does not use drugs.  Allergies:  Allergies  Allergen Reactions   Icosapent  Ethyl (Epa Ethyl Ester) (Fish) Hives   Ketamine Other (See Comments)    In vegetative state for 15 minutes per pt   [place] Itching    Itchy throat   Morphine  Hives, Itching, Rash and Other (See Comments)    Sorethroat   Penicillins Hives, Itching and Rash    Has patient had a PCN reaction causing immediate rash, facial/tongue/throat swelling, SOB or lightheadedness with hypotension: Y Has patient had a PCN reaction causing severe rash involving mucus membranes or skin necrosis: Y Has patient had a PCN reaction that required hospitalization: N Has patient had a PCN reaction occurring within the last 10 years: Y   Mushroom Other (See Comments)    Pt has never had mushrooms but tested positive on allergy test   Shellfish Allergy Other (See Comments)    Pt has never had shellfish but tested positive on allergy test. Pt states contrast in CT is okay   Fentanyl  Nausea And Vomiting   Prednisone Rash    Medications: Prior to Admission medications   Medication Sig Start Date End Date Taking? Authorizing Provider  acetaminophen -codeine (TYLENOL  #3) 300-30 MG tablet Take 1 tablet by mouth every 6 (six) hours as needed for moderate pain (pain score 4-6) or severe pain (pain score 7-10). 06/19/23  Yes [provider]   amitriptyline  (ELAVIL ) 10 MG tablet Take 10 mg by mouth at bedtime.   Yes [provider]  amLODipine  (NORVASC ) 5 MG tablet Take 1 tablet (5 mg total) by mouth daily. 07/10/23 10/08/23 Yes Simmons, Brittainy M, PA-C  aspirin  EC 81 MG EC tablet Take 1 tablet (81 mg total) by mouth daily. Swallow whole. 12/07/20  Yes Darus Engels A, DO  azelastine  (ASTELIN ) 0.1 % nasal spray Place 2 sprays into both nostrils daily at 6 (six) AM.   Yes [provider]  BAQSIMI ONE PACK 3 MG/DOSE POWD Place 1 Dose into the nose once as needed (hypoglycemia). 07/31/23  Yes [provider]  BLISOVI  FE 1.5/30 1.5-30 MG-MCG tablet Take 1 tablet by mouth daily.   Yes [provider]  butalbital -acetaminophen -caffeine  (FIORICET ) 50-325-40 MG tablet Take 1 tablet by mouth daily as needed for headache. 09/26/22  Yes [provider]  calcitRIOL (ROCALTROL) 0.25 MCG capsule Take 0.25 mcg by mouth daily. 08/04/23  Yes [provider]  carvedilol  (COREG ) 6.25 MG tablet Take 1.5 tablets (9.375 mg total) by mouth 2 (two) times daily. 08/07/23  Yes Milford, Arlice Bene, FNP  Continuous Glucose Sensor (DEXCOM G7 SENSOR) MISC Inject 1 Device into the skin See admin instructions. Change device every ten days 06/28/23  Yes [provider]  CRANBERRY CONCENTRATE PO Take 1 tablet by mouth daily at 12 noon.   Yes [provider]  DULoxetine  (CYMBALTA ) 60 MG capsule Take 60 mg by mouth at bedtime. 09/26/22  Yes [provider]  ergocalciferol  (VITAMIN D2) 1.25 MG (50000 UT) capsule Take 50,000 Units by mouth every 7 (seven) days. Every Sunday   Yes [provider]  fenofibrate  54 MG tablet Take 1 tablet (54 mg total) by mouth daily. Patient taking differently: Take 54 mg by mouth at bedtime. 12/01/22  Yes [name], Aviva Lemmings, MD  hydrALAZINE  (APRESOLINE ) 50 MG tablet Take 1.5 tablets (75 mg total) by mouth 3 (three) times daily. 07/10/23  Yes Ruddy Corral M, PA-C   insulin  lispro (HUMALOG ) 100 UNIT/ML KwikPen Inject 5-300 Units into the skin with breakfast, with lunch, and with evening meal. Inject 1 unit per 2 gram of carbohydrates before meals with a 5 units per 50 mg/dL correction before meals. Max daily dose is 300 units due to severe insulin  resistance. 07/31/23  Yes [provider]  insulin  regular human CONCENTRATED (HUMULIN  R U-500 KWIKPEN) 500 UNIT/ML KwikPen Inject 185 Units into the skin 3 (three) times daily with meals. Patient taking differently: Inject 250-300 Units into the skin 2 (two) times daily with a meal. Take 300 units in the morning. Take 250 units at bedtime 09/20/22 01/09/24 Yes Ezenduka, Nkeiruka J, MD  ivabradine  (CORLANOR ) 7.5 MG TABS tablet Take 1 tablet (7.5 mg total) by mouth 2 (two) times daily with a meal. Patient taking differently: Take 7.5 mg by mouth at bedtime. 01/31/23  Yes Milford, Arlice Bene, FNP  levocetirizine (XYZAL ) 5 MG tablet Take 5 mg by mouth every evening. 03/05/22  Yes [provider]  levothyroxine  (SYNTHROID ) 150 MCG tablet Take 150 mcg by mouth daily.   Yes [provider]  metoCLOPramide  (REGLAN ) 5 MG tablet Take 1 tablet (5 mg total) by mouth every 8 (eight) hours as needed for nausea or vomiting. Patient taking differently: Take 5 mg by mouth daily at 12 noon. 05/10/23 05/09/24 Yes Rai, Ripudeep K, MD  Multiple Vitamin (MULTIVITAMIN WITH MINERALS) TABS tablet Take 1 tablet by mouth daily.   Yes [provider]  pantoprazole  (PROTONIX ) 40 MG tablet Take 40 mg by mouth daily. 12/24/19  Yes [provider]  pregabalin  (LYRICA ) 300 MG capsule Take 300 mg by mouth 2 (two) times daily.   Yes [provider]  prochlorperazine  (COMPAZINE ) 10 MG tablet Take 10 mg by mouth at bedtime. Preventative   Yes [provider]  promethazine  (PHENERGAN ) 25 MG tablet Take 1 tablet (25 mg total) by mouth every 6 (six) hours as needed for nausea or vomiting. 08/14/23  Yes  Edmonia Gottron, PA-C  REPATHA  SURECLICK 140 MG/ML SOAJ Inject 140 mg into the skin every 14 (fourteen) days. 08/10/23  Yes [provider]  rosuvastatin  (CRESTOR ) 40 MG tablet Take 40 mg by mouth daily. 08/09/21  Yes [provider]  Safety Seal Miscellaneous MISC Hormonic Hair Solution with minoxidil USP 7% and finasteride USP 0.05% - apply to affected areas daily every morning. Patient taking differently: Apply 1 Application topically at bedtime. Hormonic Hair Solution with minoxidil USP 7% and finasteride USP 0.05% - apply to affected areas daily every morning. 06/06/23  Yes Dellar Fenton, DO  sodium bicarbonate  650 MG tablet Take 2 tablets (1,300 mg total) by mouth 2 (two) times daily. 05/10/23  Yes Rai, Ripudeep K, MD  sucralfate  (CARAFATE ) 1 g tablet Take 1 tablet (1 g total) by mouth with breakfast, with lunch, and with evening meal for 7 days. Patient taking differently: Take 1 g by mouth daily at 12 noon. 06/18/23 08/16/23 Yes Teddi Favors, DO  SUMAtriptan  (IMITREX ) 100 MG tablet Take 100 mg by mouth every 2 (two) hours as needed for migraine or headache. 08/14/23  Yes [provider]  topiramate  (TOPAMAX ) 50 MG tablet Take 50 mg by mouth 2 (two) times daily.   Yes [provider]  torsemide  (DEMADEX ) 20 MG tablet Take 3 tablets (60 mg total) by  mouth daily. 07/10/23  Yes Horace Lye, PA-C  CREON  36000-114000 units CPEP capsule Take 1 capsule (36,000 Units total) by mouth 3 (three) times daily. Patient not taking: Reported on 08/16/2023 05/10/23   Rai, Hurman Maiden, MD  polyethylene glycol (MIRALAX  / GLYCOLAX ) 17 g packet Take 17 g by mouth daily as needed for mild constipation. Also available OTC Patient not taking: Reported on 08/16/2023 05/10/23   Loma Rising, MD    I have reviewed the patient's current and prior to admission medications.  Labs:     Latest Ref Rng & Units 08/18/2023    3:22 AM 08/17/2023    3:27 AM 08/16/2023    3:40 AM   BMP  Glucose 70 - 99 mg/dL 161  88  87   BUN 6 - 20 mg/dL 45  37  48   Creatinine 0.44 - 1.00 mg/dL 0.96  0.45  4.09   Sodium 135 - 145 mmol/L 131  137  134   Potassium 3.5 - 5.1 mmol/L 3.8  3.8  3.6   Chloride 98 - 111 mmol/L 97  104  102   CO2 22 - 32 mmol/L 22  22  19    Calcium  8.9 - 10.3 mg/dL 8.8  8.9  8.5     Urinalysis    Component Value Date/Time   COLORURINE YELLOW 08/18/2023 1306   APPEARANCEUR HAZY (A) 08/18/2023 1306   LABSPEC 1.009 08/18/2023 1306   PHURINE 5.0 08/18/2023 1306   GLUCOSEU 50 (A) 08/18/2023 1306   HGBUR NEGATIVE 08/18/2023 1306   BILIRUBINUR NEGATIVE 08/18/2023 1306   KETONESUR NEGATIVE 08/18/2023 1306   PROTEINUR NEGATIVE 08/18/2023 1306   NITRITE NEGATIVE 08/18/2023 1306   LEUKOCYTESUR NEGATIVE 08/18/2023 1306     ROS:  Pertinent items noted in HPI and remainder of comprehensive ROS otherwise negative.  Physical Exam: Vitals:   08/18/23 1700 08/18/23 1800  BP: 120/72 134/85  Pulse: 77 81  Resp:    Temp:    SpO2: 93% 94%     General:  adult female in bed in NAD HEENT:  NCAT Eyes: periorbital edema  Neck: supple trachea midline  Heart: S1S2 no rub Lungs: clear to auscultation normal work of breathing at rest Abdomen: soft/distended and tender Extremities: trace edema  Skin: no rash on extremities exposed Neuro: alert and oriented x 3 provides hx and follows commands GU no foley   Assessment/Plan:  # AKI  - Multiple prerenal insults given gastroparesis, nausea and vomiting - Normal saline 500 mL - renal panel in AM - check post-void residual bladder scan and in/out catheterization if retains over 300 mL urine   # CKD stage IV  - Baseline creatinine 1.9-2.5 - Follows with Dr. Edson [facility]aces at [facility]   # Familial hypertriglyceridemia with acute on chronic pancreatitis secondary to hypertriglyceridemia - [name] on insulin  and dextrose  until triglycerides are less than 500 per charting   # Chronic systolic CHF with recovered EF -  Note that she is usually on torsemide  which is currently on hold - s/p Hydration as above as tolerated   Thank you for the consult.  Please do not hesitate to contact me with any questions regarding our patient.    Nan Aver 08/18/2023, 7:34 PM    May have some component of ischemia - will pause amlodipine  to avoid hypotension.    Nan Aver, MD 7:51 PM 08/18/2023

## 2023-08-18 NOTE — Progress Notes (Addendum)
 Patient Name: Jennifer [name] Date of Encounter: 08/18/2023 [facility] Cardiologist: Knox Perl, MD   Interval Summary  .    Creatinine elevated today, creatinine 3.25 today.  Torsemide  stopped.  No chest pain, shortness of breath.  Abdominal pain is improving.  She offers no complaints.  Vital Signs .    Vitals:   08/18/23 0700 08/18/23 0800 08/18/23 0900 08/18/23 1000  BP: 121/71 (!) 118/100 (!) 125/57 102/61  Pulse:  94 83 81  Resp: 20 17 13 15   Temp:  98 F (36.7 C)    TempSrc:  Axillary    SpO2:  98% 98% 98%  Weight:      Height:        Intake/Output Summary (Last 24 hours) at 08/18/2023 1022 Last data filed at 08/18/2023 0826 Gross per 24 hour  Intake 1335.72 ml  Output --  Net 1335.72 ml      08/15/2023    5:44 PM 08/14/2023   10:27 AM 08/07/2023    1:21 PM  Last 3 Weights  Weight (lbs) 153 lb 7 oz 149 lb 143 lb  Weight (kg) 69.6 kg 67.586 kg 64.864 kg      Telemetry/ECG    Sinus rhythm heart rates in the 80s- Personally Reviewed  CV Studies    Echocardiogram 08/16/2023 1. Left ventricular ejection fraction, by estimation, is 45 to 50%. The  left ventricle has mildly decreased function. The left ventricle  demonstrates global hypokinesis. Left ventricular diastolic function could  not be evaluated.   2. Right ventricular systolic function is normal. The right ventricular  size is normal. There is normal pulmonary artery systolic pressure.   3. The mitral valve is normal in structure. Trivial mitral valve  regurgitation. No evidence of mitral stenosis.   4. The aortic valve is tricuspid. Aortic valve regurgitation is not  visualized. No aortic stenosis is present.   5. The inferior vena cava is normal in size with <50% respiratory  variability, suggesting right atrial pressure of 8 mmHg.   Physical Exam .   GEN: No acute distress.   Neck: No JVD Cardiac: RRR, 3/6 murmur  Respiratory: Clear to auscultation bilaterally. GI: Soft,  nontender, non-distended  MS: No edema  Patient Profile    Jennifer [name] is a 33 y.o. 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  Cardiology initially asked to see due to abnormal echocardiogram.  Admitted for acute on chronic hyper triglyceridemia complaints of abdominal pain.    Assessment & Plan .     Nonischemic cardiomyopathy with mildly reduced EF -Previous EF 25 to 30% in 2022 -EF 50 to 55%, normal RV July 2024, most recent. Cardiology initially consulted for abnormal echocardiogram, results show EF 45 to 50%, global hypokinesis, normal RV.  Normal PASP.  No significant valvular disease.  Do not see any significant abnormalities.  EF seems to be stable for the past couple years.    Continues to appear euvolemic, Torsemide  stopped due to elevation in creatinine.  May be dry in setting of pancreatitis GDMT: Coreg  9.375 mg twice daily, ivabradine  7.5 mg twice daily, hydralazine  75 mg 3 times daily.  Stop torsemide  60 mg. Did not tolerate spironolactone  due to hyperkalemia, no Jardiance with frequent GU infections.  CKD limits further  titration. She has CardioMEMS at home.   CAD Cardiac catheterization 2022 demonstrating single-vessel RCA occlusion.  No chest pain. Continue with aspirin , Coreg , no statin elevated TG   Hypertension Previously poorly controlled but reasonable this admission.  Previous concerns of noncompliance but reports compliance with all of her patients now. Continue the above medications including amlodipine  5 mg   Severe hypertriglyceridemia She follows with Dr. Maximo Spar, TG is downtrending from 1200 now.  Further management per primary team.   Type IIIc  diabetes Secondary to chronic pancreatitis Followed by endocrinology at [facility].  On insulin  drip for the above as well.   Previous history of pulm hypertension and TR Noted on previous right heart catheterization in 2023.  No longer seems to be evident on recent imaging.   CKD  Creatinine went from 2-3.25.  Etiology not clear.  Further workup per primary team.    For questions or updates, please contact [facility] Please consult [email or web address] for contact info under    Signed, Burnetta Cart, PA-C    Patient seen and examined.  Agree with above documentation.  On exam, patient is alert and oriented, regular rate and rhythm, no murmurs, lungs CTAB, no LE edema or JVD.  BP 102/61 this morning.  Labs notable for rising creatinine, 2.0 > 3.25.  Suspect she is dry due to her pancreatitis, will hold her torsemide .  [name] receiving IV fluids.  Nephrology has been consulted.  Wendie Hamburg, MD

## 2023-08-18 NOTE — Plan of Care (Signed)
 Patient continues to have complaints of abdominal pain even with Q 2 hour IV 0.5 mg dilaudid , also has complaints of itching, PRN IV 12.5 mg Benadryl  being given, bladder scan done as per orders 250 ml noted, patient is voiding adequately in BSC, PO intake improving, patient not complaining of nausea at this time, plan of care and goals discussed with patient, time given for questions, patient handbook/guide at bedside.  Problem: Education: Goal: Knowledge of General Education information will improve Description: Including pain rating scale, medication(s)/side effects and non-pharmacologic comfort measures Outcome: Progressing   Problem: Health Behavior/Discharge Planning: Goal: Ability to manage health-related needs will improve Outcome: Progressing   Problem: Clinical Measurements: Goal: Ability to maintain clinical measurements within normal limits will improve Outcome: Progressing Goal: Will remain free from infection Outcome: Progressing Goal: Diagnostic test results will improve Outcome: Progressing Goal: Respiratory complications will improve Outcome: Progressing Goal: Cardiovascular complication will be avoided Outcome: Progressing   Problem: Activity: Goal: Risk for activity intolerance will decrease Outcome: Progressing   Problem: Nutrition: Goal: Adequate nutrition will be maintained Outcome: Progressing   Problem: Coping: Goal: Level of anxiety will decrease Outcome: Progressing   Problem: Elimination: Goal: Will not experience complications related to bowel motility Outcome: Progressing Goal: Will not experience complications related to urinary retention Outcome: Progressing   Problem: Safety: Goal: Ability to remain free from injury will improve Outcome: Progressing   Problem: Skin Integrity: Goal: Risk for impaired skin integrity will decrease Outcome: Progressing

## 2023-08-18 NOTE — Plan of Care (Signed)

## 2023-08-18 NOTE — Progress Notes (Signed)
 PROGRESS NOTE    Jennifer [name]  WJX:[id number] DOB: Jul 04, 1990 DOA: 08/15/2023 PCP: Calton Catholic, PA-C    Chief Complaint  Patient presents with   Abdominal Pain    Brief Narrative:  Patient is a 33 year old female history of paroxysmal A-fib, astrocytoma, chronic systolic CHF with recovered EF, history of prolonged QT interval, lipoprotein deficiency, familial hypercholesterolemia, cholesterolosis, NASH, stage IV CKD, class I obesity, type 2 diabetes, hypertension hypothyroidism, recurrent pancreatitis, gastroparesis, peripheral neuropathy, history of pancytopenia, GERD, PCOS presented to the ED with severe abdominal pain nausea dry heaves.  Patient stated abdominal pain occurred when she woke up and took a small sip of juice which triggered her pain immediately.  Patient also noted with some loose stools.  Patient seen in the ED, CBC, lipase, serum pregnancy test unremarkable.  CMP with a BUN of 47, creatinine of 2.26, AST of 87, ALT of 117.  Patient noted to have a significantly elevated triglyceride level of 2288 patient admitted to stepdown unit, placed on the insulin  drip due to concerns for possible pancreatitis induced hypertriglyceridemia.   Assessment & Plan:   Principal Problem:   Intractable abdominal pain Active Problems:   Familial hypertriglyceridemia   Type 2 diabetes mellitus with hyperlipidemia (HCC)   Hypothyroidism   Chronic systolic CHF (congestive heart failure) (HCC)   HTN (hypertension)   CKD (chronic kidney disease) stage 4, GFR 15-29 ml/min (HCC)   Pulmonary hypertension (HCC)   NASH (nonalcoholic steatohepatitis)   Obesity (BMI 30-39.9)   Gastroparesis due to DM [facility])   Hepatic steatosis   Coronary artery disease involving native coronary artery of native heart without angina pectoris   Nocturnal hypoxemia   Chronic pancreatitis (HCC)  #1 familial hypertriglyceridemia/acute on chronic pancreatitis secondary to hypertriglyceridemia - Patient noted  to have presented with sudden onset epigastric abdominal pain nausea, dry heaves, patient with a history of chronic pancreatitis. - Triglyceride level noted at 2288 on admission which is trending down currently at 1258 from 1256 from 1412 from 1534 from 1761 from 2288 on admission. - Lipase level noted at 26. - LFTs noted to be elevated but trending down, patient with history of NASH. - Right upper quadrant ultrasound done with small volume ascites without pocket of fluid large enough to allow for safe approach for paracentesis. - Patient with some clinical improvement. - IV insulin  drip rate was increased on 08/17/2023 however due to dropping CBGs was decreased back yesterday 08/17/2023.  CBGs have improved.   - Increase insulin  drip rate, until triglycerides level < 500. - Continue D10 infusion and increase rate to 75 cc an hour.  -Patient currently tolerating carb modified diet. - Check triglycerides levels every 12 hours. - Once triglyceride level <500 we will discontinue insulin  drip and placed on home regimen of fenofibrate  and statin/Crestor . - Continue every 2h CBGs and if improved and remained stable will adjust to every 4 hours.  - Supportive care.  2.  Gastroparesis due to diabetes mellitus - Currently on Reglan  IV 5 mg every 6 hours.   - Patient tolerating diet and as such we will switch IV Reglan  to oral Reglan  AC and at bedtime.   - Follow.  3.  NASH/hepatic steatosis -Outpatient follow-up with gastroenterology.  4.  Diabetes mellitus type 2 with hyperglycemia with long-term insulin  use -Hemoglobin A1c 6.1 (2/8/20250 - CBG noted at 203 this morning. - Patient on insulin  drip secondary to problem #1. -Insulin  drip dose adjusted yesterday 08/17/2023 with blood glucose levels going down to 45  and as such insulin  drip dose decreased. -CBGs now in the 200s. -Increase insulin  drip secondary to problem #1. -Continue CBGs every 2 hours. -Continue home regimen Lyrica .  5.   Hypertension - Continue home regimen Norvasc , Coreg , hydralazine . - Demadex  discontinued this morning secondary to AKI.  6.  Pulmonary hypertension - Continue home medications.  7.  Acute renal failure on CKD stage IV -Patient noted with worsening renal function with creatinine currently at 3.25 from 2.0 over the past 24 hours. -BP noted to be soft this morning. -Concern for prerenal azotemia. -Check a UA, urine sodium, urine creatinine, renal ultrasound. -Normal saline 500 cc bolus x 1. -Increase D10 to 75 cc an hour. -May need to change D10-D10 normal saline however will monitor for now. -Demadex  discontinued this morning. - Continue home regimen of bicarb tablets, calcitriol. - Consult with nephrology for further evaluation and management.  8.  CAD/chronic systolic CHF - Continue home cardiac regimen of aspirin , amlodipine , carvedilol , Demadex . - Hold fenofibrate  and statin until triglyceride level is < 500. - 2D echo obtained with a EF of 45 to 50%, left ventricular global hypokinesis. - Cardiology consulted due to abnormal 2D echo for further evaluation and recommendations.  - Patient seen in consultation by cardiology and recommend continuation of cardiac medications/heart failure medications as noted in the note.  Patient noted not to tolerate spironolactone  due to hyperkalemia, no Jardiance with frequent GU infections and CKD limits further titration. - Appreciate cardiology input and recommendations.  9. nocturnal hypoxemia -Continue O2 at bedtime.  10.  Obesity -BMI 32.24 kg/m. - Lifestyle modification. - Outpatient follow-up with PCP.  11.  Hypothyroidism - Synthroid .   DVT prophylaxis: Lovenox  Code Status: Full Family Communication: Updated patient.  No family at bedside. Disposition: Likely home when clinically improved and back to baseline.  Status is: Inpatient The patient will require care spanning > 2 midnights and should be moved to inpatient because:  Severity of illness   Consultants:  Cardiology: Dr.Schumann 08/17/2023  Procedures:  Right upper quadrant ultrasound 08/15/2023 2D echo 08/16/2023 Renal ultrasound pending  Antimicrobials: Anti-infectives (From admission, onward)    None         Subjective: Patient sitting up in recliner.  Denies any chest pain no shortness of breath.  No abdominal pain.  No nausea or vomiting.  States has good urine output.  Tolerating diet.  Patient noted yesterday to have CBG dropping to the 40s after insulin  drip increase.  CBGs improved now in the 200s.  Objective: Vitals:   08/18/23 0700 08/18/23 0800 08/18/23 0900 08/18/23 1000  BP: 121/71 (!) 118/100 (!) 125/57 102/61  Pulse:  94 83 81  Resp: 20 17 13 15   Temp:  98 F (36.7 C)    TempSrc:  Axillary    SpO2:  98% 98% 98%  Weight:      Height:        Intake/Output Summary (Last 24 hours) at 08/18/2023 1021 Last data filed at 08/18/2023 0826 Gross per 24 hour  Intake 1335.72 ml  Output --  Net 1335.72 ml   Filed Weights   08/15/23 1744  Weight: 69.6 kg    Examination:  General exam: NAD. Respiratory system: CTAB.  No wheezes, no crackles, no rhonchi.  Fair air movement.  Speaking in full sentences.   Cardiovascular system: Regular rate rhythm no murmurs rubs or gallops.  No JVD.  No pitting lower extremity edema.  Gastrointestinal system: Abdomen is soft, nontender, mildly distended, positive bowel sounds.  No rebound.  No guarding.  Central nervous system: Alert and oriented. No focal neurological deficits. Extremities: Symmetric 5 x 5 power. Skin: No rashes, lesions or ulcers Psychiatry: Judgement and insight appear normal. Mood & affect appropriate.     Data Reviewed: I have personally reviewed following labs and imaging studies  CBC: Recent Labs  Lab 08/14/23 1124 08/15/23 0641 08/16/23 0340 08/17/23 0327 08/18/23 0322  WBC 4.9 6.9 8.6 7.1 6.9  NEUTROABS 2.0  --   --  3.5  --   HGB 12.3 12.2 10.6* 10.2*  10.4*  HCT 36.2 37.1 33.9* 33.1* 33.0*  MCV 88.8 93.2 96.9 95.1 97.6  PLT 144.0* 151 123* 124* 112*    Basic Metabolic Panel: Recent Labs  Lab 08/14/23 1124 08/15/23 0641 08/16/23 0340 08/17/23 0327 08/18/23 0322  NA 136 136 134* 137 131*  K 3.7 3.6 3.6 3.8 3.8  CL 99 99 102 104 97*  CO2 26 23 19* 22 22  GLUCOSE 93 75 87 88 272*  BUN 40* 47* 48* 37* 45*  CREATININE 1.94* 2.26* 2.35* 2.00* 3.25*  CALCIUM  9.8 9.4 8.5* 8.9 8.8*  MG  --   --   --  2.2 2.2    GFR: Estimated Creatinine Clearance: 20 mL/min (A) (by C-G formula based on SCr of 3.25 mg/dL (H)).  Liver Function Tests: Recent Labs  Lab 08/14/23 1124 08/15/23 0641 08/16/23 0340 08/17/23 0327 08/18/23 0322  AST 120* 87* 85* 83* 56*  ALT 128* 117* 98* 94* 81*  ALKPHOS 96 98 84 84 89  BILITOT 0.6 0.9 0.7 0.8 0.5  PROT 7.8 7.6 6.9 7.1 7.1  ALBUMIN  4.5 4.0 3.8 3.9 3.7    CBG: Recent Labs  Lab 08/18/23 0204 08/18/23 0500 08/18/23 0622 08/18/23 0804 08/18/23 1015  GLUCAP 288* 246* 203* 166* 140*     Recent Results (from the past 240 hours)  MRSA Next Gen by PCR, Nasal     Status: None   Collection Time: 08/15/23  5:21 PM   Specimen: Nasal Mucosa; Nasal Swab  Result Value Ref Range Status   MRSA by PCR Next Gen NOT DETECTED NOT DETECTED Final    Comment: (NOTE) The GeneXpert MRSA Assay (FDA approved for NASAL specimens only), is one component of a comprehensive MRSA colonization surveillance program. It is not intended to diagnose MRSA infection nor to guide or monitor treatment for MRSA infections. Test performance is not FDA approved in patients less than 75 years old. Performed at [facility], [address] [place], [place] [address]          Radiology Studies: No results found.       Scheduled Meds:  amitriptyline   10 mg Oral QHS   amLODipine   5 mg Oral Daily   aspirin  EC  81 mg Oral Daily   azelastine   2 spray Each Nare Q0600   calcitRIOL  0.25 mcg Oral  Daily   carvedilol   9.375 mg Oral BID   Chlorhexidine  Gluconate Cloth  6 each Topical QHS   DULoxetine   60 mg Oral QHS   enoxaparin  (LOVENOX ) injection  30 mg Subcutaneous Q24H   hydrALAZINE   75 mg Oral Q8H   ivabradine   5 mg Oral BID WC   And   ivabradine   2.5 mg Oral BID WC   levothyroxine   150 mcg Oral Q0600   lipase/protease/amylase  36,000 Units Oral TID AC   loratadine   10 mg Oral QPM   metoCLOPramide  (REGLAN ) injection  5 mg Intravenous  Q6H   pantoprazole   40 mg Oral Daily   pregabalin   300 mg Oral BID   sodium bicarbonate   1,300 mg Oral BID   topiramate   50 mg Oral BID   [START ON 08/20/2023] Vitamin D  (Ergocalciferol )  50,000 Units Oral Q7 days   Continuous Infusions:  dextrose  50 mL/hr at 08/18/23 6962   famotidine  (PEPCID ) IV Stopped (08/18/23 0717)   insulin  0.2 Units/kg/hr (08/18/23 0826)     LOS: 2 days    Time spent: 40 minutes    Hilda Lovings, MD [facility]   To contact the attending provider between 7A-7P or the covering provider during after hours 7P-7A, please log into the web site [email or web address] and access using universal [place] password for that web site. If you do not have the password, please call the hospital operator.  08/18/2023, 10:21 AM

## 2023-08-19 DIAGNOSIS — N179 Acute kidney failure, unspecified: Secondary | ICD-10-CM

## 2023-08-19 DIAGNOSIS — K861 Other chronic pancreatitis: Secondary | ICD-10-CM | POA: Diagnosis not present

## 2023-08-19 DIAGNOSIS — R109 Unspecified abdominal pain: Secondary | ICD-10-CM | POA: Diagnosis not present

## 2023-08-19 DIAGNOSIS — I5022 Chronic systolic (congestive) heart failure: Secondary | ICD-10-CM | POA: Diagnosis not present

## 2023-08-19 DIAGNOSIS — K859 Acute pancreatitis without necrosis or infection, unspecified: Secondary | ICD-10-CM | POA: Diagnosis not present

## 2023-08-19 DIAGNOSIS — E871 Hypo-osmolality and hyponatremia: Secondary | ICD-10-CM

## 2023-08-19 DIAGNOSIS — E781 Pure hyperglyceridemia: Secondary | ICD-10-CM | POA: Diagnosis not present

## 2023-08-19 DIAGNOSIS — N184 Chronic kidney disease, stage 4 (severe): Secondary | ICD-10-CM | POA: Diagnosis not present

## 2023-08-19 LAB — CBC
HCT: 30.5 % — ABNORMAL LOW (ref 36.0–46.0)
Hemoglobin: 9.7 g/dL — ABNORMAL LOW (ref 12.0–15.0)
MCH: 30.7 pg (ref 26.0–34.0)
MCHC: 31.8 g/dL (ref 30.0–36.0)
MCV: 96.5 fL (ref 80.0–100.0)
Platelets: 99 10*3/uL — ABNORMAL LOW (ref 150–400)
RBC: 3.16 MIL/uL — ABNORMAL LOW (ref 3.87–5.11)
RDW: 14.7 % (ref 11.5–15.5)
WBC: 7.5 10*3/uL (ref 4.0–10.5)
nRBC: 0 % (ref 0.0–0.2)

## 2023-08-19 LAB — GLUCOSE, CAPILLARY
Glucose-Capillary: 105 mg/dL — ABNORMAL HIGH (ref 70–99)
Glucose-Capillary: 119 mg/dL — ABNORMAL HIGH (ref 70–99)
Glucose-Capillary: 142 mg/dL — ABNORMAL HIGH (ref 70–99)
Glucose-Capillary: 81 mg/dL (ref 70–99)
Glucose-Capillary: 81 mg/dL (ref 70–99)
Glucose-Capillary: 95 mg/dL (ref 70–99)

## 2023-08-19 LAB — TRIGLYCERIDES: Triglycerides: 716 mg/dL — ABNORMAL HIGH (ref <150)

## 2023-08-19 LAB — RENAL FUNCTION PANEL
Albumin: 3.7 g/dL (ref 3.5–5.0)
Anion gap: 12 (ref 5–15)
BUN: 48 mg/dL — ABNORMAL HIGH (ref 6–20)
CO2: 19 mmol/L — ABNORMAL LOW (ref 22–32)
Calcium: 8.8 mg/dL — ABNORMAL LOW (ref 8.9–10.3)
Chloride: 97 mmol/L — ABNORMAL LOW (ref 98–111)
Creatinine, Ser: 3.29 mg/dL — ABNORMAL HIGH (ref 0.44–1.00)
GFR, Estimated: 18 mL/min — ABNORMAL LOW (ref >60)
Glucose, Bld: 78 mg/dL (ref 70–99)
Phosphorus: 4.5 mg/dL (ref 2.5–4.6)
Potassium: 3.8 mmol/L (ref 3.5–5.1)
Sodium: 128 mmol/L — ABNORMAL LOW (ref 135–145)

## 2023-08-19 LAB — TSH: TSH: 6.758 u[IU]/mL — ABNORMAL HIGH (ref 0.350–4.500)

## 2023-08-19 LAB — LIPASE, BLOOD: Lipase: 22 U/L (ref 11–51)

## 2023-08-19 LAB — OSMOLALITY: Osmolality: 290 mosm/kg (ref 275–295)

## 2023-08-19 MED ORDER — FUROSEMIDE 10 MG/ML IJ SOLN
60.0000 mg | Freq: Once | INTRAMUSCULAR | Status: AC
  Administered 2023-08-20: 60 mg via INTRAVENOUS
  Filled 2023-08-19: qty 6

## 2023-08-19 MED ORDER — DEXTROSE-SODIUM CHLORIDE 10-0.45 % IV SOLN
INTRAVENOUS | Status: DC
  Filled 2023-08-19 (×2): qty 1000

## 2023-08-19 MED ORDER — SODIUM CHLORIDE 0.9 % IV BOLUS: 500.0000 mL | Freq: Once | INTRAVENOUS | Status: DC

## 2023-08-19 MED ORDER — FUROSEMIDE 10 MG/ML IJ SOLN
60.0000 mg | Freq: Once | INTRAMUSCULAR | Status: AC
  Administered 2023-08-19: 60 mg via INTRAVENOUS
  Filled 2023-08-19: qty 6

## 2023-08-19 MED ORDER — PREGABALIN 75 MG PO CAPS
150.0000 mg | ORAL_CAPSULE | Freq: Every day | ORAL | Status: DC
Start: 2023-08-20 — End: 2023-08-26
  Administered 2023-08-20 – 2023-08-26 (×7): 150 mg via ORAL
  Filled 2023-08-19 (×7): qty 2

## 2023-08-19 NOTE — Progress Notes (Addendum)
 Patient Name: Jennifer [name] Date of Encounter: 08/19/2023 [facility] Cardiologist: Knox Perl, MD   Interval Summary  .    Cr stable at 3.29, worsening hyponatremia (128).  BP 136/93.  Denies any chest pain or dyspnea  Vital Signs .    Vitals:   08/19/23 0500 08/19/23 0545 08/19/23 0600 08/19/23 0800  BP: 134/89 134/89 (!) 136/93   Pulse:   71   Resp: 17 16 20    Temp:    (!) 96.8 F (36 C)  TempSrc:    Axillary  SpO2:   93%   Weight:      Height:        Intake/Output Summary (Last 24 hours) at 08/19/2023 0906 Last data filed at 08/19/2023 0441 Gross per 24 hour  Intake 1982.83 ml  Output 900 ml  Net 1082.83 ml      08/18/2023    9:00 PM 08/15/2023    5:44 PM 08/14/2023   10:27 AM  Last 3 Weights  Weight (lbs) 166 lb 14.2 oz 153 lb 7 oz 149 lb  Weight (kg) 75.7 kg 69.6 kg 67.586 kg      Telemetry/ECG    Sinus rhythm heart rates in the 80s- Personally Reviewed  CV Studies    Echocardiogram 08/16/2023 1. Left ventricular ejection fraction, by estimation, is 45 to 50%. The  left ventricle has mildly decreased function. The left ventricle  demonstrates global hypokinesis. Left ventricular diastolic function could  not be evaluated.   2. Right ventricular systolic function is normal. The right ventricular  size is normal. There is normal pulmonary artery systolic pressure.   3. The mitral valve is normal in structure. Trivial mitral valve  regurgitation. No evidence of mitral stenosis.   4. The aortic valve is tricuspid. Aortic valve regurgitation is not  visualized. No aortic stenosis is present.   5. The inferior vena cava is normal in size with <50% respiratory  variability, suggesting right atrial pressure of 8 mmHg.   Physical Exam .   GEN: No acute distress.   Neck: No JVD Cardiac: RRR, 3/6 murmur  Respiratory: Clear to auscultation bilaterally. GI: Soft, nontender, non-distended  MS: No edema  Patient Profile    Jennifer [name] is a  33 y.o. 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  Cardiology initially asked to see due to abnormal echocardiogram.  Admitted for acute on chronic hyper triglyceridemia complaints of abdominal pain.    Assessment & Plan .     Nonischemic cardiomyopathy with mildly reduced EF -Previous EF 25 to 30% in 2022 -EF 50 to 55%, normal RV July 2024, most recent. Cardiology initially consulted for abnormal echocardiogram, results show EF 45 to 50%, global hypokinesis, normal RV.  Normal PASP.  No significant valvular disease.  Do not see any significant abnormalities.  EF seems to be stable for the past couple years.    Continues to appear euvolemic, Torsemide  stopped due to elevation in creatinine.   GDMT: Coreg  9.375 mg twice daily, ivabradine  7.5 mg twice daily, hydralazine  75 mg 3 times daily.  Continue to hold torsemide  60 mg. Did not tolerate spironolactone  due to hyperkalemia, no Jardiance with frequent GU infections.  CKD limits further titration. She has CardioMEMS at home.   CAD Cardiac  catheterization 2022 demonstrating single-vessel RCA occlusion.  No chest pain. Continue with aspirin , Coreg , no statin elevated TG   Hypertension Previously poorly controlled but reasonable this admission.  Previous concerns of noncompliance but reports compliance with all of her patients now. Continue the above medications including amlodipine  5 mg   Severe hypertriglyceridemia She follows with Dr. Maximo Spar, TG is downtrending from 1200 now.  Further management per primary team.   Type IIIc diabetes Secondary to chronic pancreatitis Followed by endocrinology at [facility].  On insulin  drip for the above as  well.   Previous history of pulm hypertension and TR Noted on previous right heart catheterization in 2023.  No longer seems to be evident on recent imaging.   AKI on CKD  Creatinine went from 2-3.25.  Nephrology consulted, receiving IVF  For questions or updates, please contact [facility] Please consult [email or web address] for contact info under    Signed, Wendie Hamburg, MD

## 2023-08-19 NOTE — Progress Notes (Addendum)
 PROGRESS NOTE    Jennifer [name]  UJW:[id number] DOB: Nov 15, 1990 DOA: 08/15/2023 PCP: Calton Catholic, PA-C    Chief Complaint  Patient presents with   Abdominal Pain    Brief Narrative:  Patient is a 33 year old female history of paroxysmal A-fib, astrocytoma, chronic systolic CHF with recovered EF, history of prolonged QT interval, lipoprotein deficiency, familial hypercholesterolemia, cholesterolosis, NASH, stage IV CKD, class I obesity, type 2 diabetes, hypertension hypothyroidism, recurrent pancreatitis, gastroparesis, peripheral neuropathy, history of pancytopenia, GERD, PCOS presented to the ED with severe abdominal pain nausea dry heaves.  Patient stated abdominal pain occurred when she woke up and took a small sip of juice which triggered her pain immediately.  Patient also noted with some loose stools.  Patient seen in the ED, CBC, lipase, serum pregnancy test unremarkable.  CMP with a BUN of 47, creatinine of 2.26, AST of 87, ALT of 117.  Patient noted to have a significantly elevated triglyceride level of 2288 patient admitted to stepdown unit, placed on the insulin  drip due to concerns for possible pancreatitis induced hypertriglyceridemia.   Assessment & Plan:   Principal Problem:   Intractable abdominal pain Active Problems:   Familial hypertriglyceridemia   AKI (acute kidney injury) (HCC)   Type 2 diabetes mellitus with hyperlipidemia (HCC)   Hypothyroidism   Chronic systolic CHF (congestive heart failure) (HCC)   HTN (hypertension)   CKD (chronic kidney disease) stage 4, GFR 15-29 ml/min (HCC)   Pulmonary hypertension (HCC)   NASH (nonalcoholic steatohepatitis)   Obesity (BMI 30-39.9)   Hyponatremia   Acute on chronic pancreatitis (HCC)   Gastroparesis due to DM (HCC)   Hepatic steatosis   Coronary artery disease involving native coronary artery of native heart without angina pectoris   Nocturnal hypoxemia   Chronic pancreatitis (HCC)  #1 familial  hypertriglyceridemia/acute on chronic pancreatitis secondary to hypertriglyceridemia - Patient noted to have presented with sudden onset epigastric abdominal pain nausea, dry heaves, patient with a history of chronic pancreatitis. - Triglyceride level noted at 2288 on admission which is trending down currently at 716 from 896 from 1258 from 1256 from 1412 from 1534 from 1761 from 2288 on admission. - Lipase level noted at 26. - LFTs noted to be elevated but trending down, patient with history of NASH. - Right upper quadrant ultrasound done with small volume ascites without pocket of fluid large enough to allow for safe approach for paracentesis. - Patient with clinical improvement with no further nausea or vomiting.  Patient however this morning with complaints of epigastric sharp abdominal pain. -Downgrade diet to clear liquids for the next 24 hours. -Change IV fluids to D5 half-normal saline at 75 cc an hour. - IV insulin  drip rate was increased on 08/17/2023 however due to dropping CBGs was decreased back on 08/17/2023.  CBGs have improved.   - Increased insulin  drip rate back up on 08/18/2023, until triglycerides level < 500. - Check triglycerides levels every 12 hours. - Once triglyceride level <500 we will discontinue insulin  drip and place on home regimen of fenofibrate  and statin/Crestor . - Continue CBG every 4 hours.   - Supportive care.  2.  Gastroparesis due to diabetes mellitus - Was initially on Reglan  IV 5 mg every 6 hours.   - Patient was tolerating diet and IV Reglan  switched to oral Reglan .  -Follow.   3.  NASH/hepatic steatosis -Outpatient follow-up with gastroenterology.  4.  Diabetes mellitus type 2 with hyperglycemia with long-term insulin  use -Hemoglobin A1c 6.1 (2/8/20250 - CBG  noted at 95 this morning. - Patient on insulin  drip secondary to problem #1. -Insulin  drip dose adjusted on 08/17/2023 with blood glucose levels going down to 45 and as such insulin  drip dose  decreased. -CBGs improved and insulin  drip increased on 08/18/2023.   - Diet also advanced to a regular diet due to low CBGs.   - Patient with complaints of epigastric abdominal pain, concern for pancreatitis and as such we will downgrade diet to clear liquids.  -Monitor CBGs.   - Continue Reglan .  -Continue home regimen Lyrica .  5.  Hypertension - Continue home regimen Norvasc , Coreg , hydralazine . - Demadex  discontinued secondary to AKI.  6.  Pulmonary hypertension - Continue home medications.  7.  Acute renal failure on CKD stage IV -Baseline creatinine 1.9-2.5. -Patient noted with worsening renal function with creatinine currently at 3.29 from 3.25 from 2.0 over the past 24 hours. -BP noted to be soft on 08/18/2023, BP has improved this morning with systolic in the 120s.  -Concern for prerenal azotemia. - Renal ultrasound negative for hydronephrosis.  - Urinalysis nitrite negative, leukocytes negative, negative for protein.  Urine sodium noted at 15, urine creatinine of 108.  - Patient with complaints of epigastric abdominal pain will give another bolus of normal saline 500 cc x 1.  - Change IV fluids to D10 half-normal saline at 75 cc an hour as patient developing hyponatremia. -Demadex  on hold.   - Continue home regimen of bicarb tablets, calcitriol. - Nephrology consulted and following and appreciate input and recommendations.  8.  Hyponatremia -??  Etiology. - Concern for hypovolemic hyponatremia versus due to increased free water  as patient noted to have been on D10 infusion. -Daily weights. - Check a urine and serum osmolality. - Check a TSH. - Change IV fluids from D10 infusion to D10 half-normal saline. - Nephrology following.  9.  CAD/chronic systolic CHF - Continue home cardiac regimen of aspirin , amlodipine , carvedilol , Demadex . - Continue to hold fenofibrate  and statin until triglyceride level is < 500. - 2D echo obtained with a EF of 45 to 50%, left ventricular  global hypokinesis. - Cardiology consulted due to abnormal 2D echo for further evaluation and recommendations.  - Patient seen in consultation by cardiology and recommend continuation of cardiac medications/heart failure medications as noted in the note.  Patient noted not to tolerate spironolactone  due to hyperkalemia, no Jardiance with frequent GU infections and CKD limits further titration. - Appreciate cardiology input and recommendations.  9. nocturnal hypoxemia -Continue O2 at bedtime.  10.  Obesity -BMI 32.24 kg/m. - Lifestyle modification. - Outpatient follow-up with PCP.  11.  Hypothyroidism - Check a TSH.   - Continue Synthroid .   DVT prophylaxis: Lovenox  Code Status: Full Family Communication: Updated patient.  No family at bedside. Disposition: Likely home when clinically improved and back to baseline.  Status is: Inpatient The patient will require care spanning > 2 midnights and should be moved to inpatient because: Severity of illness   Consultants:  Cardiology: Dr.Schumann 08/17/2023 Nephrology: Dr. Yvonnie Heritage 08/18/2023  Procedures:  Right upper quadrant ultrasound 08/15/2023 2D echo 08/16/2023 Renal ultrasound 08/18/2023   Antimicrobials: Anti-infectives (From admission, onward)    None         Subjective: Patient sitting up in bed.  Some complaints of epigastric abdominal pain that she states sharp in nature last about an hour and a half and associated with oral intake.  Patient states having bowel movements daily.  Denies any substernal chest pain.  No significant shortness  of breath.  Denies any nausea or vomiting.  Tolerating diet.    Objective: Vitals:   08/19/23 0500 08/19/23 0545 08/19/23 0600 08/19/23 0800  BP: 134/89 134/89 (!) 136/93   Pulse:   71   Resp: 17 16 20    Temp:    (!) 96.8 F (36 C)  TempSrc:    Axillary  SpO2:   93%   Weight:      Height:        Intake/Output Summary (Last 24 hours) at 08/19/2023 1000 Last data filed at  08/19/2023 0940 Gross per 24 hour  Intake 2531.68 ml  Output 900 ml  Net 1631.68 ml   Filed Weights   08/15/23 1744 08/18/23 2100  Weight: 69.6 kg 75.7 kg    Examination:  General exam: NAD. Respiratory system: Lungs clear to auscultation bilaterally.  No wheezes, no crackles, no rhonchi.  Fair air movement.  Speaking in full sentences.  Cardiovascular system: RRR no murmurs rubs or gallops.  No JVD.  No pitting lower extremity edema.  Gastrointestinal system: Abdomen is soft, mildly distended, some tenderness to palpation in the epigastric region, positive bowel sounds.  No rebound.  No guarding. Central nervous system: Alert and oriented. No focal neurological deficits. Extremities: Symmetric 5 x 5 power. Skin: No rashes, lesions or ulcers Psychiatry: Judgement and insight appear normal. Mood & affect appropriate.     Data Reviewed: I have personally reviewed following labs and imaging studies  CBC: Recent Labs  Lab 08/14/23 1124 08/15/23 0641 08/16/23 0340 08/17/23 0327 08/18/23 0322 08/19/23 0744  WBC 4.9 6.9 8.6 7.1 6.9 7.5  NEUTROABS 2.0  --   --  3.5  --   --   HGB 12.3 12.2 10.6* 10.2* 10.4* 9.7*  HCT 36.2 37.1 33.9* 33.1* 33.0* 30.5*  MCV 88.8 93.2 96.9 95.1 97.6 96.5  PLT 144.0* 151 123* 124* 112* 99*    Basic Metabolic Panel: Recent Labs  Lab 08/15/23 0641 08/16/23 0340 08/17/23 0327 08/18/23 0322 08/19/23 0744  NA 136 134* 137 131* 128*  K 3.6 3.6 3.8 3.8 3.8  CL 99 102 104 97* 97*  CO2 23 19* 22 22 19*  GLUCOSE 75 87 88 272* 78  BUN 47* 48* 37* 45* 48*  CREATININE 2.26* 2.35* 2.00* 3.25* 3.29*  CALCIUM  9.4 8.5* 8.9 8.8* 8.8*  MG  --   --  2.2 2.2  --   PHOS  --   --   --   --  4.5    GFR: Estimated Creatinine Clearance: 20.7 mL/min (A) (by C-G formula based on SCr of 3.29 mg/dL (H)).  Liver Function Tests: Recent Labs  Lab 08/14/23 1124 08/15/23 0641 08/16/23 0340 08/17/23 0327 08/18/23 0322 08/19/23 0744  AST 120* 87* 85* 83* 56*   --   ALT 128* 117* 98* 94* 81*  --   ALKPHOS 96 98 84 84 89  --   BILITOT 0.6 0.9 0.7 0.8 0.5  --   PROT 7.8 7.6 6.9 7.1 7.1  --   ALBUMIN  4.5 4.0 3.8 3.9 3.7 3.7    CBG: Recent Labs  Lab 08/18/23 1721 08/18/23 1956 08/19/23 0014 08/19/23 0419 08/19/23 0814  GLUCAP 112* 144* 119* 95 81     Recent Results (from the past 240 hours)  MRSA Next Gen by PCR, Nasal     Status: None   Collection Time: 08/15/23  5:21 PM   Specimen: Nasal Mucosa; Nasal Swab  Result Value Ref Range Status   MRSA  by PCR Next Gen NOT DETECTED NOT DETECTED Final    Comment: (NOTE) The GeneXpert MRSA Assay (FDA approved for NASAL specimens only), is one component of a comprehensive MRSA colonization surveillance program. It is not intended to diagnose MRSA infection nor to guide or monitor treatment for MRSA infections. Test performance is not FDA approved in patients less than 20 years old. Performed at [facility], [address] [place], [place] [address]          Radiology Studies: US  RENAL Result Date: 08/18/2023 CLINICAL DATA:  Acute renal failure EXAM: RENAL / URINARY TRACT ULTRASOUND COMPLETE COMPARISON:  06/21/2023 FINDINGS: Right Kidney: Renal measurements: 8.7 x 5.4 x 5.0 cm = volume: 122.6 mL. Echogenicity within normal limits. No mass or hydronephrosis visualized. Left Kidney: Renal measurements: 9.9 x 4.5 x 4.3 cm = volume: 100.4 mL. Echogenicity within normal limits. No mass or hydronephrosis visualized. Bladder: Appears normal for degree of bladder distention. Other: None. IMPRESSION: 1. Unremarkable renal ultrasound. Electronically Signed   By: Bobbye Burrow M.D.   On: 08/18/2023 16:54         Scheduled Meds:  amitriptyline   10 mg Oral QHS   aspirin  EC  81 mg Oral Daily   azelastine   2 spray Each Nare Q0600   calcitRIOL  0.25 mcg Oral Daily   carvedilol   9.375 mg Oral BID   Chlorhexidine  Gluconate Cloth  6 each Topical QHS   DULoxetine   60 mg Oral QHS    enoxaparin  (LOVENOX ) injection  30 mg Subcutaneous Q24H   hydrALAZINE   75 mg Oral Q8H   ivabradine   5 mg Oral BID WC   And   ivabradine   2.5 mg Oral BID WC   levothyroxine   150 mcg Oral Q0600   lipase/protease/amylase  36,000 Units Oral TID AC   loratadine   10 mg Oral QPM   metoCLOPramide   5 mg Oral TID AC & HS   pantoprazole   40 mg Oral Daily   pregabalin   300 mg Oral BID   sodium bicarbonate   1,300 mg Oral BID   topiramate   50 mg Oral BID   [START ON 08/20/2023] Vitamin D  (Ergocalciferol )  50,000 Units Oral Q7 days   Continuous Infusions:  dextrose  10 % and 0.45 % NaCl     famotidine  (PEPCID ) IV Stopped (08/19/23 4696)   insulin  0.2 Units/kg/hr (08/19/23 0949)     LOS: 3 days    Time spent: 40 minutes    Hilda Lovings, MD [facility]   To contact the attending provider between 7A-7P or the covering provider during after hours 7P-7A, please log into the web site [email or web address] and access using universal [place] password for that web site. If you do not have the password, please call the hospital operator.  08/19/2023, 10:00 AM

## 2023-08-19 NOTE — Progress Notes (Signed)
 [facility] Progress Note  Name: Jennifer [name] MRN: [id number] DOB: Dec 08, 1990  Chief Complaint:  Abdominal pain and nausea   Subjective:  She had 900 mL UOP over 5/23 as well as two unmeasured urine voids.  We stopped amlodipine  to avoid hypotension.  She had a bladder scan with 175 mL - unsure of timing since last void.  She feels swollen  Review of systems:  She reports shortness of breath and some chest discomfort  Hx nausea Has abdominal pain  ------------------- Background on consult:  Jennifer [name] is a 33 y.o. female with a history of astrocytoma, atrial fibrillation, chronic systolic congestive heart failure with recovered EF, NASH, and CKD stage IV who presented to the hospital with abdominal pain and nausea.  She was found to have hypertriglyceridemia felt induced by pancreatitis.  She has been treated with insulin  and D10.  Her creatinine has increased up to 3.25 from 2.0 the day prior.  Nephrology is consulted for assistance with management of AKI.  Strict ins/outs are not available.  She had 800 mL UOP over 5/22 as well as two unmeasured urine voids.  She had 1.1 liters UOP over 5/21.  She had a renal US  without hydro.  I recommended giving normal saline 500 mL once earlier today.  She states that she is having some trouble urinating.  [name] usually on torsemide  60 mg daily at home.  She follows with Dr. Edson Graces at our office.     Intake/Output Summary (Last 24 hours) at 08/19/2023 1650 Last data filed at 08/19/2023 1521 Gross per 24 hour  Intake 2771.68 ml  Output 1450 ml  Net 1321.68 ml    Vitals:  Vitals:   08/19/23 1341 08/19/23 1430 08/19/23 1500 08/19/23 1626  BP:      Pulse: 76 77    Resp: 18 19    Temp:    98.4 F (36.9 C)  TempSrc:    Oral  SpO2: 95% 93% 95%   Weight:      Height:         Physical Exam:  General:  adult female in bed in NAD HEENT:  NCAT Eyes: periorbital edema  Neck: supple trachea midline  Heart: S1S2 no  rub Lungs: clear to auscultation normal work of breathing at rest Abdomen: soft/distended and tender Extremities: 1+ edema  Skin: no rash on extremities exposed Neuro: alert and oriented x 3 provides hx and follows commands Psych: normal mood and affect GU no foley   Medications reviewed   Labs:     Latest Ref Rng & Units 08/19/2023    7:44 AM 08/18/2023    3:22 AM 08/17/2023    3:27 AM  BMP  Glucose 70 - 99 mg/dL 78  578  88   BUN 6 - 20 mg/dL 48  45  37   Creatinine 0.44 - 1.00 mg/dL 4.69  6.29  5.28   Sodium 135 - 145 mmol/L 128  131  137   Potassium 3.5 - 5.1 mmol/L 3.8  3.8  3.8   Chloride 98 - 111 mmol/L 97  97  104   CO2 22 - 32 mmol/L 19  22  22    Calcium  8.9 - 10.3 mg/dL 8.8  8.8  8.9      Assessment/Plan:    # AKI  - Multiple prerenal insults given gastroparesis, nausea and vomiting.  Also note some periods of relative hypotension.  [name] now s/p hydration with pancreatitis/hypertriglyceridemia - renal panel in AM -  check repeat post-void residual bladder scan and in/out catheterization if retains over 300 mL urine  - have stopped amlodipine  to avoid hypotension  - lasix  as below   # CKD stage IV  - Baseline creatinine 1.9-2.5 - Follows with Dr. Edson Graces at [facility]    # Familial hypertriglyceridemia with acute on chronic pancreatitis secondary to hypertriglyceridemia - [name] on insulin  and dextrose  until triglycerides are less than 500 per charting    # Chronic systolic CHF with recovered EF - Note that she is usually on torsemide  which is currently on hold - s/p Hydration as above as tolerated  - lasix  60 mg IV once now and in AM   Disposition - continue inpatient monitoring    Nan Aver, MD 08/19/2023 5:13 PM

## 2023-08-19 NOTE — Plan of Care (Signed)
  Problem: Clinical Measurements: Goal: Diagnostic test results will improve Outcome: Progressing Goal: Cardiovascular complication will be avoided Outcome: Progressing   Problem: Elimination: Goal: Will not experience complications related to bowel motility Outcome: Not Progressing Goal: Will not experience complications related to urinary retention Outcome: Not Progressing

## 2023-08-20 ENCOUNTER — Inpatient Hospital Stay (HOSPITAL_COMMUNITY)

## 2023-08-20 DIAGNOSIS — R109 Unspecified abdominal pain: Secondary | ICD-10-CM | POA: Diagnosis not present

## 2023-08-20 DIAGNOSIS — N179 Acute kidney failure, unspecified: Secondary | ICD-10-CM | POA: Diagnosis not present

## 2023-08-20 DIAGNOSIS — E039 Hypothyroidism, unspecified: Secondary | ICD-10-CM | POA: Diagnosis not present

## 2023-08-20 DIAGNOSIS — R0602 Shortness of breath: Secondary | ICD-10-CM

## 2023-08-20 DIAGNOSIS — K861 Other chronic pancreatitis: Secondary | ICD-10-CM | POA: Diagnosis not present

## 2023-08-20 DIAGNOSIS — E1143 Type 2 diabetes mellitus with diabetic autonomic (poly)neuropathy: Secondary | ICD-10-CM | POA: Diagnosis not present

## 2023-08-20 DIAGNOSIS — R42 Dizziness and giddiness: Secondary | ICD-10-CM

## 2023-08-20 DIAGNOSIS — K859 Acute pancreatitis without necrosis or infection, unspecified: Secondary | ICD-10-CM | POA: Diagnosis not present

## 2023-08-20 DIAGNOSIS — I5022 Chronic systolic (congestive) heart failure: Secondary | ICD-10-CM | POA: Diagnosis not present

## 2023-08-20 LAB — GLUCOSE, CAPILLARY
Glucose-Capillary: 166 mg/dL — ABNORMAL HIGH (ref 70–99)
Glucose-Capillary: 225 mg/dL — ABNORMAL HIGH (ref 70–99)
Glucose-Capillary: 226 mg/dL — ABNORMAL HIGH (ref 70–99)
Glucose-Capillary: 71 mg/dL (ref 70–99)
Glucose-Capillary: 72 mg/dL (ref 70–99)
Glucose-Capillary: 87 mg/dL (ref 70–99)

## 2023-08-20 LAB — BASIC METABOLIC PANEL WITH GFR
Anion gap: 12 (ref 5–15)
Anion gap: 11 (ref 5–15)
BUN: 43 mg/dL — ABNORMAL HIGH (ref 6–20)
BUN: 45 mg/dL — ABNORMAL HIGH (ref 6–20)
CO2: 19 mmol/L — ABNORMAL LOW (ref 22–32)
CO2: 19 mmol/L — ABNORMAL LOW (ref 22–32)
Calcium: 8.2 mg/dL — ABNORMAL LOW (ref 8.9–10.3)
Calcium: 8 mg/dL — ABNORMAL LOW (ref 8.9–10.3)
Chloride: 89 mmol/L — ABNORMAL LOW (ref 98–111)
Chloride: 93 mmol/L — ABNORMAL LOW (ref 98–111)
Creatinine, Ser: 3.15 mg/dL — ABNORMAL HIGH (ref 0.44–1.00)
Creatinine, Ser: 3.19 mg/dL — ABNORMAL HIGH (ref 0.44–1.00)
GFR, Estimated: 19 mL/min — ABNORMAL LOW (ref >60)
GFR, Estimated: 19 mL/min — ABNORMAL LOW (ref >60)
Glucose, Bld: 172 mg/dL — ABNORMAL HIGH (ref 70–99)
Glucose, Bld: 209 mg/dL — ABNORMAL HIGH (ref 70–99)
Potassium: 4.2 mmol/L (ref 3.5–5.1)
Potassium: 4.2 mmol/L (ref 3.5–5.1)
Sodium: 120 mmol/L — ABNORMAL LOW (ref 135–145)
Sodium: 123 mmol/L — ABNORMAL LOW (ref 135–145)

## 2023-08-20 LAB — SODIUM
Sodium: 120 mmol/L — ABNORMAL LOW (ref 135–145)
Sodium: 121 mmol/L — ABNORMAL LOW (ref 135–145)

## 2023-08-20 LAB — CBC
HCT: 27.6 % — ABNORMAL LOW (ref 36.0–46.0)
Hemoglobin: 9 g/dL — ABNORMAL LOW (ref 12.0–15.0)
MCH: 30.8 pg (ref 26.0–34.0)
MCHC: 32.6 g/dL (ref 30.0–36.0)
MCV: 94.5 fL (ref 80.0–100.0)
Platelets: 96 10*3/uL — ABNORMAL LOW (ref 150–400)
RBC: 2.92 MIL/uL — ABNORMAL LOW (ref 3.87–5.11)
RDW: 14.4 % (ref 11.5–15.5)
WBC: 7.7 10*3/uL (ref 4.0–10.5)
nRBC: 0 % (ref 0.0–0.2)

## 2023-08-20 LAB — COMPREHENSIVE METABOLIC PANEL WITH GFR
ALT: 85 U/L — ABNORMAL HIGH (ref 0–44)
AST: 81 U/L — ABNORMAL HIGH (ref 15–41)
Albumin: 3.6 g/dL (ref 3.5–5.0)
Alkaline Phosphatase: 77 U/L (ref 38–126)
Anion gap: 11 (ref 5–15)
BUN: 46 mg/dL — ABNORMAL HIGH (ref 6–20)
CO2: 19 mmol/L — ABNORMAL LOW (ref 22–32)
Calcium: 8.3 mg/dL — ABNORMAL LOW (ref 8.9–10.3)
Chloride: 91 mmol/L — ABNORMAL LOW (ref 98–111)
Creatinine, Ser: 3.32 mg/dL — ABNORMAL HIGH (ref 0.44–1.00)
GFR, Estimated: 18 mL/min — ABNORMAL LOW (ref >60)
Glucose, Bld: 81 mg/dL (ref 70–99)
Potassium: 3.8 mmol/L (ref 3.5–5.1)
Sodium: 121 mmol/L — ABNORMAL LOW (ref 135–145)
Total Bilirubin: 0.8 mg/dL (ref 0.0–1.2)
Total Protein: 7 g/dL (ref 6.5–8.1)

## 2023-08-20 LAB — SODIUM, URINE, RANDOM: Sodium, Ur: <10 mmol/L

## 2023-08-20 LAB — TRIGLYCERIDES
Triglycerides: 648 mg/dL — ABNORMAL HIGH (ref <150)
Triglycerides: 569 mg/dL — ABNORMAL HIGH (ref <150)

## 2023-08-20 LAB — CREATININE, URINE, RANDOM: Creatinine, Urine: 80 mg/dL

## 2023-08-20 LAB — BRAIN NATRIURETIC PEPTIDE: B Natriuretic Peptide: 278.7 pg/mL — ABNORMAL HIGH (ref 0.0–100.0)

## 2023-08-20 MED ORDER — INSULIN GLARGINE-YFGN 100 UNIT/ML ~~LOC~~ SOLN
50.0000 [IU] | Freq: Two times a day (BID) | SUBCUTANEOUS | Status: DC
  Administered 2023-08-20 – 2023-08-22 (×6): 50 [IU] via SUBCUTANEOUS
  Filled 2023-08-20 (×8): qty 0.5

## 2023-08-20 MED ORDER — DEXTROSE-SODIUM CHLORIDE 5-0.9 % IV SOLN: INTRAVENOUS | Status: DC

## 2023-08-20 MED ORDER — INSULIN ASPART 100 UNIT/ML IJ SOLN
0.0000 [IU] | INTRAMUSCULAR | Status: DC
Start: 2023-08-20 — End: 2023-08-21
  Administered 2023-08-20: 3 [IU] via SUBCUTANEOUS
  Administered 2023-08-20: 2 [IU] via SUBCUTANEOUS
  Administered 2023-08-20: 3 [IU] via SUBCUTANEOUS
  Administered 2023-08-21 (×3): 2 [IU] via SUBCUTANEOUS

## 2023-08-20 MED ORDER — SODIUM CHLORIDE 3 % IV SOLN
INTRAVENOUS | Status: DC
  Filled 2023-08-20 (×3): qty 500

## 2023-08-20 MED ORDER — ROSUVASTATIN CALCIUM 20 MG PO TABS
40.0000 mg | ORAL_TABLET | Freq: Every day | ORAL | Status: DC
  Administered 2023-08-20 – 2023-08-26 (×7): 40 mg via ORAL
  Filled 2023-08-20 (×7): qty 2

## 2023-08-20 MED ORDER — FUROSEMIDE 10 MG/ML IJ SOLN: 40.0000 mg | Freq: Once | INTRAMUSCULAR | Status: DC

## 2023-08-20 MED ORDER — FENOFIBRATE 54 MG PO TABS
54.0000 mg | ORAL_TABLET | Freq: Every day | ORAL | Status: DC
  Administered 2023-08-20 – 2023-08-26 (×7): 54 mg via ORAL
  Filled 2023-08-20 (×7): qty 1

## 2023-08-20 NOTE — Progress Notes (Signed)
 Patient Name: Jennifer [name] Date of Encounter: 08/20/2023 [facility] Cardiologist: Knox Perl, MD   Interval Summary  .    Cr stable at 3.3, worsening hyponatremia (121).  BP 110/72.  Reports shortness of breath today  Vital Signs .    Vitals:   08/20/23 0400 08/20/23 0500 08/20/23 0600 08/20/23 0756  BP: (!) 135/95 125/73 110/72   Pulse: 77 78 77   Resp: 15 16 19    Temp: 99 F (37.2 C)   97.8 F (36.6 C)  TempSrc: Oral   Oral  SpO2: 93% 93% 93%   Weight:      Height:        Intake/Output Summary (Last 24 hours) at 08/20/2023 0818 Last data filed at 08/20/2023 0600 Gross per 24 hour  Intake 3133.37 ml  Output 1500 ml  Net 1633.37 ml      08/20/2023    1:06 AM 08/19/2023   10:01 AM 08/18/2023    9:00 PM  Last 3 Weights  Weight (lbs) 166 lb 10.7 oz 170 lb 3.1 oz 166 lb 14.2 oz  Weight (kg) 75.6 kg 77.2 kg 75.7 kg      Telemetry/ECG    Sinus rhythm heart rates in the 80s- Personally Reviewed  CV Studies    Echocardiogram 08/16/2023 1. Left ventricular ejection fraction, by estimation, is 45 to 50%. The  left ventricle has mildly decreased function. The left ventricle  demonstrates global hypokinesis. Left ventricular diastolic function could  not be evaluated.   2. Right ventricular systolic function is normal. The right ventricular  size is normal. There is normal pulmonary artery systolic pressure.   3. The mitral valve is normal in structure. Trivial mitral valve  regurgitation. No evidence of mitral stenosis.   4. The aortic valve is tricuspid. Aortic valve regurgitation is not  visualized. No aortic stenosis is present.   5. The inferior vena cava is normal in size with <50% respiratory  variability, suggesting right atrial pressure of 8 mmHg.   Physical Exam .   GEN: No acute distress.   Neck: No JVD Cardiac: RRR, 3/6 murmur  Respiratory: Clear to auscultation bilaterally. GI: Soft, nontender, non-distended  MS: No edema  Patient  Profile    Jennifer [name] is a 33 y.o. 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  Cardiology initially asked to see due to abnormal echocardiogram.  Admitted for acute on chronic hyper triglyceridemia complaints of abdominal pain.    Assessment & Plan .     Nonischemic cardiomyopathy with mildly reduced EF -Previous EF 25 to 30% in 2022 -EF 50 to 55%, normal RV July 2024, most recent. Cardiology initially consulted for abnormal echocardiogram, results show EF 45 to 50%, global hypokinesis, normal RV.  Normal PASP.  No significant valvular disease.  Do not see any significant abnormalities.  EF seems to be stable for the past couple years.   GDMT: Coreg  9.375 mg twice daily, ivabradine  7.5 mg twice daily, hydralazine  75 mg 3 times daily.   Did not tolerate spironolactone  due to hyperkalemia, no Jardiance with frequent GU infections.  CKD limits further titration. She has CardioMEMS at home. Nephrology consulted due to AKI, initially given IV fluids for pancreatitis.  Fluid balance is a fine line with her  pancreatitis and systolic heart failure.  [name] now reporting starting to become short of breath.  Agree with nephrology plan to start IV lasix    CAD Cardiac catheterization 2022 demonstrating single-vessel RCA occlusion.  No chest pain. Continue with aspirin , Coreg , no statin elevated TG   Hypertension Previously poorly controlled but reasonable this admission.  Previous concerns of noncompliance but reports compliance with all of her patients now. Continue the above medications.  Amlodipine  on hold   Severe hypertriglyceridemia She follows with Dr. Maximo Spar, TG is downtrending.   Further management per primary team.   Type IIIc diabetes Secondary to chronic pancreatitis Followed by endocrinology at [facility].  On insulin  drip for the above as well.   Previous history of pulm hypertension and TR Noted on previous right heart catheterization in 2023.  No longer seems to be evident on recent imaging.   AKI on CKD  Creatinine went from 2-3.3.  Nephrology consulted  For questions or updates, please contact  [facility] Please consult [email or web address] for contact info under    Signed, Wendie Hamburg, MD

## 2023-08-20 NOTE — Progress Notes (Addendum)
 [facility] Progress Note  Name: Jennifer [name] MRN: [id number] DOB: 1990/08/29  Chief Complaint:  Abdominal pain and nausea   Subjective:  She had 550 mL UOP over 5/24 as well as two unmeasured urine voids.  She was transitioned to D5NS this morning given hyponatremia.  [name] a little sleepy but she had some dilaudid  recently for pain per RN report.  She had an elevated post-void bladder scan x 2 and does report sometimes difficulty with urination.  [name] comfortable with me stopping the cymbalta  and states that they were going to stop the amitriptyline  soon anyway  Review of systems:  She denies shortness of breath; states that her breathing is better.  She denies chest pain  Hx nausea - improved Has abdominal pain - improving   ------------------- Background on consult:  Jennifer [name] is a 33 y.o. female with a history of astrocytoma, atrial fibrillation, chronic systolic congestive heart failure with recovered EF, NASH, and CKD stage IV who presented to the hospital with abdominal pain and nausea.  She was found to have hypertriglyceridemia felt induced by pancreatitis.  She has been treated with insulin  and D10.  Her creatinine has increased up to 3.25 from 2.0 the day prior.  Nephrology is consulted for assistance with management of AKI.  Strict ins/outs are not available.  She had 800 mL UOP over 5/22 as well as two unmeasured urine voids.  She had 1.1 liters UOP over 5/21.  She had a renal US  without hydro.  I recommended giving normal saline 500 mL once earlier today.  She states that she is having some trouble urinating.  [name] usually on torsemide  60 mg daily at home.  She follows with Jennifer [name] at our office.     Intake/Output Summary (Last 24 hours) at 08/20/2023 1424 Last data filed at 08/20/2023 1232 Gross per 24 hour  Intake 2883.26 ml  Output 2150 ml  Net 733.26 ml    Vitals:  Vitals:   08/20/23 1114 08/20/23 1200 08/20/23 1402 08/20/23 1408   BP:  137/73 125/75   Pulse:  73  74  Resp:  15  (!) 21  Temp: 97.8 F (36.6 C)     TempSrc: Oral     SpO2:  100%  96%  Weight:      Height:         Physical Exam:   General:  adult female in bed in NAD HEENT:  NCAT Eyes: periorbital edema  Neck: supple trachea midline  Heart: S1S2 no rub Lungs: clear to auscultation normal work of breathing at rest on room air Abdomen: soft/distended; less tender Extremities: trace to 1+ edema  Skin: no rash on extremities exposed Neuro: sleepy today; she is oriented x 3 but does have delayed answers to some questions; follows commands Psych: normal mood and affect GU no foley   Medications reviewed   Labs:     Latest Ref Rng & Units 08/20/2023    5:51 AM 08/19/2023    7:44 AM 08/18/2023    3:22 AM  BMP  Glucose 70 - 99 mg/dL 81  78  829   BUN 6 - 20 mg/dL 46  48  45   Creatinine 0.44 - 1.00 mg/dL 5.62  1.30  8.65   Sodium 135 - 145 mmol/L 121  128  131   Potassium 3.5 - 5.1 mmol/L 3.8  3.8  3.8   Chloride 98 - 111 mmol/L 91  97  97   CO2  22 - 32 mmol/L 19  19  22    Calcium  8.9 - 10.3 mg/dL 8.3  8.8  8.8      Assessment/Plan:    # AKI  - Multiple prerenal insults given gastroparesis, nausea and vomiting.  Also note some periods of relative hypotension.  [name] now s/p hydration with pancreatitis/hypertriglyceridemia - check repeat post-void residual bladder scan and in/out catheterization if retains over 250 mL urine  - have stopped amlodipine  to avoid hypotension  - lasix  as below   # CKD stage IV  - Baseline creatinine 1.9-2.5 - Follows with Jennifer [name] at [facility]    # Familial hypertriglyceridemia with acute on chronic pancreatitis secondary to hypertriglyceridemia - [name] on insulin  and dextrose  until triglycerides are less than 500 per charting    # Hyponatremia  - Complicated patient with CHF, pancreatitis, and on multiple meds including cymbalta  and amitripyline.  Urine sodium is less than 10 so may not be  contributing  - [name] more sleepy today and this may be related to pain meds but Na is now dropping - Will give 3% saline at 30 ml/hr and check sodium per protocol first every 2 hours then spacing to every 4 hours   # Chronic systolic CHF with recovered EF - Note that she is usually on torsemide  which is currently on hold - s/p Hydration as above as tolerated  - defer additional lasix  today   Disposition - continue inpatient monitoring.  Continue monitoring in current room (she is to receive 3% saline)     Jennifer Aver, MD 08/20/2023 3:14 PM    Please call or page nephrology on call if sodium rises above 127 before 7 am on 08/21/23.  Please stop 3% saline once sodium is 126 or above  Jennifer Aver, MD 3:17 PM 08/20/2023

## 2023-08-20 NOTE — Plan of Care (Signed)
  Problem: Clinical Measurements: Goal: Will remain free from infection Outcome: Progressing Goal: Diagnostic test results will improve Outcome: Not Progressing Goal: Respiratory complications will improve Outcome: Not Progressing Goal: Cardiovascular complication will be avoided Outcome: Progressing   Problem: Coping: Goal: Level of anxiety will decrease Outcome: Progressing   Problem: Elimination: Goal: Will not experience complications related to bowel motility Outcome: Progressing Goal: Will not experience complications related to urinary retention Outcome: Progressing

## 2023-08-20 NOTE — Progress Notes (Signed)
 PROGRESS NOTE    Jennifer [name]  WUJ:[id number] DOB: 1990-04-04 DOA: 08/15/2023 PCP: Calton Catholic, PA-C    Chief Complaint  Patient presents with   Abdominal Pain    Brief Narrative:  Patient is a 33 year old female history of paroxysmal A-fib, astrocytoma, chronic systolic CHF with recovered EF, history of prolonged QT interval, lipoprotein deficiency, familial hypercholesterolemia, cholesterolosis, NASH, stage IV CKD, class I obesity, type 2 diabetes, hypertension hypothyroidism, recurrent pancreatitis, gastroparesis, peripheral neuropathy, history of pancytopenia, GERD, PCOS presented to the ED with severe abdominal pain nausea dry heaves.  Patient stated abdominal pain occurred when she woke up and took a small sip of juice which triggered her pain immediately.  Patient also noted with some loose stools.  Patient seen in the ED, CBC, lipase, serum pregnancy test unremarkable.  CMP with a BUN of 47, creatinine of 2.26, AST of 87, ALT of 117.  Patient noted to have a significantly elevated triglyceride level of 2288 patient admitted to stepdown unit, placed on the insulin  drip due to concerns for possible pancreatitis induced hypertriglyceridemia.   Assessment & Plan:   Principal Problem:   Intractable abdominal pain Active Problems:   Familial hypertriglyceridemia   AKI (acute kidney injury) (HCC)   Type 2 diabetes mellitus with hyperlipidemia (HCC)   Hypothyroidism   Chronic systolic CHF (congestive heart failure) (HCC)   HTN (hypertension)   CKD (chronic kidney disease) stage 4, GFR 15-29 ml/min (HCC)   Pulmonary hypertension (HCC)   NASH (nonalcoholic steatohepatitis)   Obesity (BMI 30-39.9)   Hyponatremia   Acute on chronic pancreatitis (HCC)   Gastroparesis due to DM (HCC)   Hepatic steatosis   Coronary artery disease involving native coronary artery of native heart without angina pectoris   Nocturnal hypoxemia   Chronic pancreatitis (HCC)   SOB (shortness of  breath)   Dizziness  #1 familial hypertriglyceridemia/acute on chronic pancreatitis secondary to hypertriglyceridemia - Patient noted to have presented with sudden onset epigastric abdominal pain nausea, dry heaves, patient with a history of chronic pancreatitis. - Triglyceride level noted at 2288 on admission which is trending down currently at 648 from 716 from 896 from 1258 from 1256 from 1412 from 1534 from 1761 from 2288 on admission. - Lipase level noted at 26. - LFTs noted to be elevated but trending down, patient with history of NASH. - Right upper quadrant ultrasound done with small volume ascites without pocket of fluid large enough to allow for safe approach for paracentesis. - Patient with clinical improvement with no further nausea or vomiting.  Patient however this morning with complaints of epigastric sharp abdominal pain. - Advance diet to a full liquid diet. -Change IV fluids to D5 normal saline at 50 cc an hour. - IV insulin  drip rate was increased on 08/17/2023 however due to dropping CBGs was decreased back on 08/17/2023.  CBGs have improved.   - Increased insulin  drip rate back up on 08/18/2023. - Check triglycerides levels every 12 hours. -Triglycerides levels at 648 this morning, patient with hyponatremia. -Will transition to subcutaneous Semglee  50 units twice daily. -Resume home regimen of fenofibrate  and Crestor . - Continue CBG every 4 hours.   - Supportive care.  2.  Gastroparesis due to diabetes mellitus - Was initially on Reglan  IV 5 mg every 6 hours.   -Patient has been transition to oral Reglan  which we will continue.  3.  NASH/hepatic steatosis -Outpatient follow-up with gastroenterology.  4.  Diabetes mellitus type 2 with hyperglycemia with long-term insulin  use -  Hemoglobin A1c 6.1 (2/8/20250 - CBG noted at 87 this morning. - Patient on insulin  drip secondary to problem #1. -Insulin  drip dose adjusted on 08/17/2023 with blood glucose levels going down to  45 and as such insulin  drip dose decreased. -CBGs improved and insulin  drip increased on 08/18/2023.   - Diet also advanced to a regular diet due to low CBGs early on and downgraded to clear liquid diet on 08/19/2023 due to patient's complaints of epigastric abdominal pain. - Will transition to subcutaneous Semglee  50 units twice daily today. - Will also place on SSI after transition. - Continue Reglan . - Continue Lyrica  at decreased dose of 150 mg daily due to renal function.  5.  Hypertension - Continue Coreg , hydralazine .   - Norvasc  discontinued to allow room for IV Lasix .   6.  Pulmonary hypertension - Continue home medications.  7.  Acute renal failure on CKD stage IV -Baseline creatinine 1.9-2.5. -Patient noted with worsening renal function with creatinine currently at 3.32 from 3.29 from 3.25 from 2.0 over the past 24 hours. -BP noted to be soft on 08/18/2023, BP has improved this morning with systolic in the 120s.  -Concern for prerenal azotemia. - Renal ultrasound negative for hydronephrosis.  - Urinalysis nitrite negative, leukocytes negative, negative for protein.  Urine sodium noted at 15, urine creatinine of 108.  - Patient denies any further epigastric abdominal pain.   - Patient complained of feeling bloated to nephrology and started on IV Lasix .   - Urine output recorded on 550 cc over the past 24 hours as well as 2 undocumented urine outputs.   - Change IV fluids to D5 normal saline at 50 cc an hour for now.  -Continue IV Lasix  as per nephrology.  - Continue home regimen of bicarb tablets, calcitriol. -Per nephrology.  8.  Hyponatremia -??  Etiology. -Patient with worsening hyponatremia. - Concern for hypovolemic hyponatremia versus due to increased free water  as patient noted to have been on D10 infusion. -D10 infusion was changed to half-normal saline which we will discontinue and place patient on D5 normal saline. -Daily weights. - Check a urine osmolality. -  Serum osmolality of 290. - Repeat urine sodium, urine creatinine. - TSH at 6.758. - Patient states thyroid  dose recently adjusted. -Check BMET every 8 hours. - Nephrology following.  9.  CAD/chronic systolic CHF - Continue home cardiac regimen of aspirin , amlodipine , carvedilol , Demadex . - Continue to hold fenofibrate  and statin until triglyceride level is < 500. - 2D echo obtained with a EF of 45 to 50%, left ventricular global hypokinesis. - Cardiology consulted due to abnormal 2D echo for further evaluation and recommendations.  - Patient seen in consultation by cardiology and recommend continuation of cardiac medications/heart failure medications as noted in the note.  Patient noted not to tolerate spironolactone  due to hyperkalemia, no Jardiance with frequent GU infections and CKD limits further titration. -Patient with some complaints of shortness of breath.  Check a BNP.  Check a chest x-ray. -Patient on IV Lasix . - Appreciate cardiology input and recommendations.  9. nocturnal hypoxemia -Continue O2 at bedtime.  10.  Obesity -BMI 32.24 kg/m. - Lifestyle modification. - Outpatient follow-up with PCP.  11.  Hypothyroidism - TSH 6.758.   - Patient states Synthroid  dose was adjusted 1 week prior to admission. -Continue current Synthroid  dose. -Outpatient follow-up with primary endocrinologist.  12.  Dizziness -Patient with some complaints of dizziness. - Check orthostatics.  13.  Shortness of breath -Some complaints of shortness of  breath. - Check a BNP. - Check a chest x-ray. - Patient on IV Lasix .   DVT prophylaxis: Lovenox  Code Status: Full Family Communication: Updated patient.  No family at bedside. Disposition: Likely home when clinically improved and back to baseline.  Status is: Inpatient The patient will require care spanning > 2 midnights and should be moved to inpatient because: Severity of illness   Consultants:  Cardiology: Dr.Schumann  08/17/2023 Nephrology: Dr. Yvonnie Heritage 08/18/2023  Procedures:  Right upper quadrant ultrasound 08/15/2023 2D echo 08/16/2023 Renal ultrasound 08/18/2023   Antimicrobials: Anti-infectives (From admission, onward)    None         Subjective: Patient lying in bed.  Some complaints of dizziness when going to the bedside commode.  Patient with some complaints of shortness of breath.  Denies any substernal chest pain.  Denies any further epigastric abdominal pain.  Tolerated clear liquids.   Objective: Vitals:   08/20/23 0800 08/20/23 0823 08/20/23 0826 08/20/23 0853  BP: 132/82     Pulse: 80 76 77 79  Resp: 18 17 (!) 22 20  Temp:      TempSrc:      SpO2: 97% 95% 96% 95%  Weight:      Height:        Intake/Output Summary (Last 24 hours) at 08/20/2023 0951 Last data filed at 08/20/2023 0858 Gross per 24 hour  Intake 2643.26 ml  Output 1250 ml  Net 1393.26 ml   Filed Weights   08/18/23 2100 08/19/23 1001 08/20/23 0106  Weight: 75.7 kg 77.2 kg 75.6 kg    Examination:  General exam: NAD. Respiratory system: Decreased breath sounds in the bases otherwise clear.  No wheezes, no crackles, no rhonchi.  Fair air movement.  Speaking in full sentences.  Cardiovascular system: Regular rate and rhythm no murmurs rubs or gallops.  No JVD.  No pitting lower extremity edema. Gastrointestinal system: Abdomen is soft, mildly distended, nontender to palpation epigastrium.  Positive bowel sounds.  No rebound.  No guarding.  Central nervous system: Alert and oriented. No focal neurological deficits. Extremities: Symmetric 5 x 5 power. Skin: No rashes, lesions or ulcers Psychiatry: Judgement and insight appear normal. Mood & affect appropriate.     Data Reviewed: I have personally reviewed following labs and imaging studies  CBC: Recent Labs  Lab 08/14/23 1124 08/15/23 0641 08/16/23 0340 08/17/23 0327 08/18/23 0322 08/19/23 0744 08/20/23 0551  WBC 4.9   < > 8.6 7.1 6.9 7.5 7.7   NEUTROABS 2.0  --   --  3.5  --   --   --   HGB 12.3   < > 10.6* 10.2* 10.4* 9.7* 9.0*  HCT 36.2   < > 33.9* 33.1* 33.0* 30.5* 27.6*  MCV 88.8   < > 96.9 95.1 97.6 96.5 94.5  PLT 144.0*   < > 123* 124* 112* 99* 96*   < > = values in this interval not displayed.    Basic Metabolic Panel: Recent Labs  Lab 08/16/23 0340 08/17/23 0327 08/18/23 0322 08/19/23 0744 08/20/23 0551  NA 134* 137 131* 128* 121*  K 3.6 3.8 3.8 3.8 3.8  CL 102 104 97* 97* 91*  CO2 19* 22 22 19* 19*  GLUCOSE 87 88 272* 78 81  BUN 48* 37* 45* 48* 46*  CREATININE 2.35* 2.00* 3.25* 3.29* 3.32*  CALCIUM  8.5* 8.9 8.8* 8.8* 8.3*  MG  --  2.2 2.2  --   --   PHOS  --   --   --  4.5  --     GFR: Estimated Creatinine Clearance: 20.5 mL/min (A) (by C-G formula based on SCr of 3.32 mg/dL (H)).  Liver Function Tests: Recent Labs  Lab 08/15/23 1308 08/16/23 0340 08/17/23 0327 08/18/23 0322 08/19/23 0744 08/20/23 0551  AST 87* 85* 83* 56*  --  81*  ALT 117* 98* 94* 81*  --  85*  ALKPHOS 98 84 84 89  --  77  BILITOT 0.9 0.7 0.8 0.5  --  0.8  PROT 7.6 6.9 7.1 7.1  --  7.0  ALBUMIN  4.0 3.8 3.9 3.7 3.7 3.6    CBG: Recent Labs  Lab 08/19/23 1617 08/19/23 1920 08/20/23 0054 08/20/23 0316 08/20/23 0756  GLUCAP 105* 81 71 72 87     Recent Results (from the past 240 hours)  MRSA Next Gen by PCR, Nasal     Status: None   Collection Time: 08/15/23  5:21 PM   Specimen: Nasal Mucosa; Nasal Swab  Result Value Ref Range Status   MRSA by PCR Next Gen NOT DETECTED NOT DETECTED Final    Comment: (NOTE) The GeneXpert MRSA Assay (FDA approved for NASAL specimens only), is one component of a comprehensive MRSA colonization surveillance program. It is not intended to diagnose MRSA infection nor to guide or monitor treatment for MRSA infections. Test performance is not FDA approved in patients less than 56 years old. Performed at [facility], [address] [place], [place] [address]           Radiology Studies: US  RENAL Result Date: 08/18/2023 CLINICAL DATA:  Acute renal failure EXAM: RENAL / URINARY TRACT ULTRASOUND COMPLETE COMPARISON:  06/21/2023 FINDINGS: Right Kidney: Renal measurements: 8.7 x 5.4 x 5.0 cm = volume: 122.6 mL. Echogenicity within normal limits. No mass or hydronephrosis visualized. Left Kidney: Renal measurements: 9.9 x 4.5 x 4.3 cm = volume: 100.4 mL. Echogenicity within normal limits. No mass or hydronephrosis visualized. Bladder: Appears normal for degree of bladder distention. Other: None. IMPRESSION: 1. Unremarkable renal ultrasound. Electronically Signed   By: Bobbye Burrow M.D.   On: 08/18/2023 16:54         Scheduled Meds:  amitriptyline   10 mg Oral QHS   aspirin  EC  81 mg Oral Daily   azelastine   2 spray Each Nare Q0600   calcitRIOL  0.25 mcg Oral Daily   carvedilol   9.375 mg Oral BID   Chlorhexidine  Gluconate Cloth  6 each Topical QHS   DULoxetine   60 mg Oral QHS   enoxaparin  (LOVENOX ) injection  30 mg Subcutaneous Q24H   hydrALAZINE   75 mg Oral Q8H   ivabradine   5 mg Oral BID WC   And   ivabradine   2.5 mg Oral BID WC   levothyroxine   150 mcg Oral Q0600   lipase/protease/amylase  36,000 Units Oral TID AC   loratadine   10 mg Oral QPM   metoCLOPramide   5 mg Oral TID AC & HS   pantoprazole   40 mg Oral Daily   pregabalin   150 mg Oral Daily   sodium bicarbonate   1,300 mg Oral BID   topiramate   50 mg Oral BID   Vitamin D  (Ergocalciferol )  50,000 Units Oral Q7 days   Continuous Infusions:  dextrose  5 % and 0.9 % NaCl 50 mL/hr at 08/20/23 0935   famotidine  (PEPCID ) IV Stopped (08/20/23 0853)   insulin  Stopped (08/20/23 0804)     LOS: 4 days    Time spent: 40 minutes    Hilda Lovings, MD  [facility]   To contact the attending provider between 7A-7P or the covering provider during after hours 7P-7A, please log into the web site [email or web address] and access using universal [place] password for that web site. If  you do not have the password, please call the hospital operator.  08/20/2023, 9:51 AM

## 2023-08-21 DIAGNOSIS — K861 Other chronic pancreatitis: Secondary | ICD-10-CM | POA: Diagnosis not present

## 2023-08-21 DIAGNOSIS — E1143 Type 2 diabetes mellitus with diabetic autonomic (poly)neuropathy: Secondary | ICD-10-CM | POA: Diagnosis not present

## 2023-08-21 DIAGNOSIS — I951 Orthostatic hypotension: Secondary | ICD-10-CM

## 2023-08-21 DIAGNOSIS — K859 Acute pancreatitis without necrosis or infection, unspecified: Secondary | ICD-10-CM | POA: Diagnosis not present

## 2023-08-21 DIAGNOSIS — N179 Acute kidney failure, unspecified: Secondary | ICD-10-CM | POA: Diagnosis not present

## 2023-08-21 DIAGNOSIS — R109 Unspecified abdominal pain: Secondary | ICD-10-CM | POA: Diagnosis not present

## 2023-08-21 DIAGNOSIS — E039 Hypothyroidism, unspecified: Secondary | ICD-10-CM | POA: Diagnosis not present

## 2023-08-21 DIAGNOSIS — I5022 Chronic systolic (congestive) heart failure: Secondary | ICD-10-CM | POA: Diagnosis not present

## 2023-08-21 LAB — CBC
HCT: 28.6 % — ABNORMAL LOW (ref 36.0–46.0)
Hemoglobin: 9.1 g/dL — ABNORMAL LOW (ref 12.0–15.0)
MCH: 30.6 pg (ref 26.0–34.0)
MCHC: 31.8 g/dL (ref 30.0–36.0)
MCV: 96.3 fL (ref 80.0–100.0)
Platelets: 104 10*3/uL — ABNORMAL LOW (ref 150–400)
RBC: 2.97 MIL/uL — ABNORMAL LOW (ref 3.87–5.11)
RDW: 15 % (ref 11.5–15.5)
WBC: 7 10*3/uL (ref 4.0–10.5)
nRBC: 0 % (ref 0.0–0.2)

## 2023-08-21 LAB — BASIC METABOLIC PANEL WITH GFR
Anion gap: 15 (ref 5–15)
BUN: 46 mg/dL — ABNORMAL HIGH (ref 6–20)
CO2: 15 mmol/L — ABNORMAL LOW (ref 22–32)
Calcium: 8 mg/dL — ABNORMAL LOW (ref 8.9–10.3)
Chloride: 95 mmol/L — ABNORMAL LOW (ref 98–111)
Creatinine, Ser: 2.95 mg/dL — ABNORMAL HIGH (ref 0.44–1.00)
GFR, Estimated: 21 mL/min — ABNORMAL LOW (ref >60)
Glucose, Bld: 182 mg/dL — ABNORMAL HIGH (ref 70–99)
Potassium: 4.1 mmol/L (ref 3.5–5.1)
Sodium: 125 mmol/L — ABNORMAL LOW (ref 135–145)

## 2023-08-21 LAB — SODIUM
Sodium: 124 mmol/L — ABNORMAL LOW (ref 135–145)
Sodium: 126 mmol/L — ABNORMAL LOW (ref 135–145)
Sodium: 127 mmol/L — ABNORMAL LOW (ref 135–145)
Sodium: 128 mmol/L — ABNORMAL LOW (ref 135–145)

## 2023-08-21 LAB — GLUCOSE, CAPILLARY
Glucose-Capillary: 154 mg/dL — ABNORMAL HIGH (ref 70–99)
Glucose-Capillary: 164 mg/dL — ABNORMAL HIGH (ref 70–99)
Glucose-Capillary: 174 mg/dL — ABNORMAL HIGH (ref 70–99)
Glucose-Capillary: 174 mg/dL — ABNORMAL HIGH (ref 70–99)
Glucose-Capillary: 185 mg/dL — ABNORMAL HIGH (ref 70–99)
Glucose-Capillary: 187 mg/dL — ABNORMAL HIGH (ref 70–99)
Glucose-Capillary: 218 mg/dL — ABNORMAL HIGH (ref 70–99)

## 2023-08-21 LAB — TRIGLYCERIDES: Triglycerides: 635 mg/dL — ABNORMAL HIGH (ref <150)

## 2023-08-21 MED ORDER — FAMOTIDINE 20 MG PO TABS
20.0000 mg | ORAL_TABLET | Freq: Two times a day (BID) | ORAL | Status: DC
Start: 2023-08-21 — End: 2023-08-21

## 2023-08-21 MED ORDER — MIDODRINE HCL 5 MG PO TABS
5.0000 mg | ORAL_TABLET | Freq: Three times a day (TID) | ORAL | Status: AC
  Administered 2023-08-21 – 2023-08-22 (×3): 5 mg via ORAL
  Filled 2023-08-21 (×3): qty 1

## 2023-08-21 MED ORDER — ALBUMIN HUMAN 25 % IV SOLN
25.0000 g | Freq: Once | INTRAVENOUS | Status: AC
  Administered 2023-08-21: 25 g via INTRAVENOUS
  Filled 2023-08-21: qty 100

## 2023-08-21 MED ORDER — FAMOTIDINE 20 MG PO TABS
20.0000 mg | ORAL_TABLET | Freq: Every day | ORAL | Status: DC
  Administered 2023-08-21: 20 mg via ORAL
  Filled 2023-08-21: qty 1

## 2023-08-21 MED ORDER — MIDODRINE HCL 5 MG PO TABS: 5.0000 mg | ORAL_TABLET | Freq: Three times a day (TID) | ORAL | Status: DC

## 2023-08-21 MED ORDER — FUROSEMIDE 10 MG/ML IJ SOLN
80.0000 mg | Freq: Two times a day (BID) | INTRAMUSCULAR | Status: DC
  Administered 2023-08-21 – 2023-08-22 (×2): 80 mg via INTRAVENOUS
  Filled 2023-08-21 (×2): qty 8

## 2023-08-21 MED ORDER — ALBUTEROL SULFATE (2.5 MG/3ML) 0.083% IN NEBU
INHALATION_SOLUTION | RESPIRATORY_TRACT | Status: AC
  Filled 2023-08-21: qty 3

## 2023-08-21 MED ORDER — SODIUM BICARBONATE 650 MG PO TABS
650.0000 mg | ORAL_TABLET | Freq: Two times a day (BID) | ORAL | Status: DC
  Administered 2023-08-21 – 2023-08-24 (×6): 650 mg via ORAL
  Filled 2023-08-21 (×6): qty 1

## 2023-08-21 MED ORDER — MIDODRINE HCL 5 MG PO TABS: 5.0000 mg | ORAL_TABLET | Freq: Three times a day (TID) | ORAL | Status: DC | PRN

## 2023-08-21 MED ORDER — INSULIN ASPART 100 UNIT/ML IJ SOLN
0.0000 [IU] | Freq: Three times a day (TID) | INTRAMUSCULAR | Status: DC
  Administered 2023-08-21: 8 [IU] via SUBCUTANEOUS
  Administered 2023-08-21: 2 [IU] via SUBCUTANEOUS
  Administered 2023-08-22 (×2): 1 [IU] via SUBCUTANEOUS
  Administered 2023-08-22: 2 [IU] via SUBCUTANEOUS
  Administered 2023-08-24 (×3): 3 [IU] via SUBCUTANEOUS
  Administered 2023-08-25 (×3): 2 [IU] via SUBCUTANEOUS
  Administered 2023-08-26: 3 [IU] via SUBCUTANEOUS
  Administered 2023-08-26: 7 [IU] via SUBCUTANEOUS

## 2023-08-21 MED ORDER — FAMOTIDINE 20 MG PO TABS
10.0000 mg | ORAL_TABLET | Freq: Every day | ORAL | Status: DC
  Administered 2023-08-22 – 2023-08-26 (×5): 10 mg via ORAL
  Filled 2023-08-21 (×5): qty 1

## 2023-08-21 MED ORDER — IPRATROPIUM-ALBUTEROL 0.5-2.5 (3) MG/3ML IN SOLN: 3.0000 mL | RESPIRATORY_TRACT | Status: DC | PRN

## 2023-08-21 NOTE — Progress Notes (Addendum)
 Patient Name: Jennifer [name] Date of Encounter: 08/21/2023  [facility] Cardiologist: Jennifer Perl, MD   Interval Summary  .    BP 120/75. Started on hypertonic saline yesterday per nephrology, improvement in renal function (creatinine 3.3 > 2.95) and hyponatremia (120 > 125).  Reports abdominal pain and some shortness of breath  Vital Signs .    Vitals:   08/21/23 0600 08/21/23 0700 08/21/23 0800 08/21/23 0900  BP: 116/64 (!) 130/95 132/85 120/75  Pulse: 73 73 75 71  Resp: 19 15 14 17   Temp:   97.9 F (36.6 C)   TempSrc:   Oral   SpO2: 95% 93% 94% 95%  Weight:      Height:        Intake/Output Summary (Last 24 hours) at 08/21/2023 0937 Last data filed at 08/21/2023 0700 Gross per 24 hour  Intake 1266.61 ml  Output 1900 ml  Net -633.39 ml      08/21/2023    5:00 AM 08/20/2023    1:06 AM 08/19/2023   10:01 AM  Last 3 Weights  Weight (lbs) 167 lb 8.8 oz 166 lb 10.7 oz 170 lb 3.1 oz  Weight (kg) 76 kg 75.6 kg 77.2 kg      Telemetry/ECG    Sinus rhythm heart rates in the 80s- Personally Reviewed  CV Studies    Echocardiogram 08/16/2023 1. Left ventricular ejection fraction, by estimation, is 45 to 50%. The  left ventricle has mildly decreased function. The left ventricle  demonstrates global hypokinesis. Left ventricular diastolic function could  not be evaluated.   2. Right ventricular systolic function is normal. The right ventricular  size is normal. There is normal pulmonary artery systolic pressure.   3. The mitral valve is normal in structure. Trivial mitral valve  regurgitation. No evidence of mitral stenosis.   4. The aortic valve is tricuspid. Aortic valve regurgitation is not  visualized. No aortic stenosis is present.   5. The inferior vena cava is normal in size with <50% respiratory  variability, suggesting right atrial pressure of 8 mmHg.   Physical Exam .   GEN: No acute distress.   Neck: No JVD Cardiac: RRR, 3/6 murmur  Respiratory:  Clear to auscultation bilaterally. GI: Soft, nontender, non-distended  MS: No edema  Patient Profile    Jennifer [name] is a 33 y.o. 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  Cardiology initially asked to see due to abnormal echocardiogram.  Admitted for acute on chronic hyper triglyceridemia complaints of abdominal pain.    Assessment & Plan .     Nonischemic cardiomyopathy with mildly reduced EF -Previous EF 25 to 30% in 2022 -EF 50 to 55%, normal RV July 2024, most recent. Cardiology initially consulted for abnormal echocardiogram, results show EF 45 to 50%, global hypokinesis, normal RV.  Normal PASP.  No significant valvular disease.  EF seems to be stable for the past couple years.   GDMT: Coreg  9.375 mg twice daily, ivabradine  7.5 mg twice daily, hydralazine  75 mg 3 times daily.  Hold hydralazine  given positive orthostatics today Did not tolerate spironolactone  due to hyperkalemia, no Jardiance with frequent GU infections.  CKD limits further titration. She has [facility] Nephrology consulted due to AKI, initially given IV fluids for pancreatitis.  Fluid balance is a fine line with her pancreatitis and systolic heart failure.  She was subsequently started on IV Lasix  but had worsening hyponatremia.  Nephrology started hypertonic saline yesterday, both renal function and hyponatremia improving   CAD Cardiac catheterization 2022 demonstrating single-vessel RCA occlusion.  No chest pain. Continue with aspirin , Coreg , no statin elevated TG   Hypertension Previously poorly controlled but reasonable this admission.  Previous concerns of noncompliance but reports compliance with all  of her patients now. Positive orthostatics today, amlodipine  has been on hold, will also hold hydralazine    Severe hypertriglyceridemia Acute on chronic pancreatitis She follows with Dr. Maximo [name], TG is downtrending.  Further management per primary team.   Type IIIc diabetes On insulin    AKI on CKD  Creatinine went from 2 to 3.3.  Nephrology consulted, creatinine improved today to 2.95  For questions or updates, please contact [facility] Please consult [email or web address] for contact info under    Signed, Jennifer Hamburg, MD

## 2023-08-21 NOTE — TOC Initial Note (Signed)
 Transition of Care [facility]) - Initial/Assessment Note    Patient Details  Name: Jennifer [name] MRN: [id number] Date of Birth: 24-Apr-1990  Transition of Care [facility]) CM/SW Contact:    Bari Leys, RN Phone Number: 08/21/2023, 2:58 PM  Clinical Narrative:   Met with patient at bedside to introduce role of TOC/NCM and review for dc planning, pt dozing during initial assessment, NCM offered to come back at a later time, pt said that she was ok to talk, reviewed PT eval for short term rehab/SNF, pt shook her head she is agreeable, no preference, shook her her ok for NCM to initate SNF process. FL2 updated, faxed out for bed offers. TOC will continue to follow.                 Expected Discharge Plan: Skilled Nursing Facility Barriers to Discharge: Continued Medical Work up   Patient Goals and CMS Choice Patient states their goals for this hospitalization and ongoing recovery are:: return home          Expected Discharge Plan and Services       Living arrangements for the past 2 months: Apartment                                      Prior Living Arrangements/Services Living arrangements for the past 2 months: Apartment   Patient language and need for interpreter reviewed:: Yes        Need for Family Participation in Patient Care: Yes (Comment) Care giver support system in place?: Yes (comment)   Criminal Activity/Legal Involvement Pertinent to Current Situation/Hospitalization: No - Comment as needed  Activities of Daily Living   ADL Screening (condition at time of admission) Independently performs ADLs?: Yes (appropriate for developmental age) Is the patient deaf or have difficulty hearing?: No Does the patient have difficulty seeing, even when wearing glasses/contacts?: Yes Does the patient have difficulty concentrating, remembering, or making decisions?: No  Permission Sought/Granted                  Emotional Assessment Appearance:: Appears stated  age Attitude/Demeanor/Rapport: Unable to Assess Affect (typically observed): Unable to Assess Orientation: : Oriented to Self, Oriented to Place, Oriented to  Time, Oriented to Situation Alcohol  / Substance Use: Not Applicable Psych Involvement: No (comment)  Admission diagnosis:  Generalized abdominal pain [R10.84] Intractable abdominal pain [R10.9] Nausea and vomiting, unspecified vomiting type [R11.2] Patient Active Problem List   Diagnosis Date Noted   SOB (shortness of breath) 08/20/2023   Dizziness 08/20/2023   Chronic pancreatitis (HCC) 08/15/2023   Witnessed episode of apnea 06/27/2023   Nocturnal hypoxemia 06/27/2023   Abdominal pain 05/05/2023   Pseudohyponatremia 04/10/2023   Metabolic acidosis 04/10/2023   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 coli UTI (urinary tract infection) 06/01/2022   DKA (diabetic ketoacidosis) (HCC) 06/01/2022   ASCUS of cervix with negative high risk HPV 05/31/2022   Acute on chronic pancreatitis (HCC) 05/29/2022  Hypokalemia 03/23/2022   COVID 03/15/2022   COVID-19 virus infection 03/14/2022   Hyponatremia 03/14/2022   Positive D dimer 03/14/2022   Stage 3b chronic kidney disease (CKD) (HCC) 03/14/2022   Pulmonary hypertension (HCC) 02/06/2022   NASH (nonalcoholic steatohepatitis) 02/06/2022   Obesity (BMI 30-39.9) 02/06/2022   Heart failure (HCC) 12/03/2021   Acute on chronic systolic CHF (congestive heart failure) (HCC) 12/02/2021   Elevated  troponin 12/02/2021   Pancytopenia (HCC) 12/02/2021   Amenorrhea 12/02/2021   Hepatic steatosis 09/27/2021   Hyperosmolar hyperglycemic state (HHS) (HCC) 08/26/2021   Small intestinal bacterial overgrowth (SIBO) 08/26/2021   Lower extremity edema 08/26/2021   Hyperkalemia 08/14/2021   Hx of insulin  dependent diabetes mellitus 08/14/2021   CKD (chronic kidney disease) stage 4, GFR 15-29 ml/min (HCC) 05/13/2021   Afib (HCC) 05/12/2021   Gastroparesis due to DM (HCC) 05/12/2021   Abdominal distension 05/12/2021   Chest pain 03/10/2021   Acute on chronic combined systolic and diastolic CHF (congestive heart failure) (HCC) 02/09/2021   History of astrocytoma of brain 02/09/2021   Type 2 diabetes mellitus with hyperglycemia, with long-term current use of insulin  (HCC) 01/25/2021   Diarrhea    Elevated transaminase level    Orthostatic hypotension 11/28/2020   Acute combined systolic and diastolic heart failure (HCC)    Nonischemic cardiomyopathy (HCC)    Acute decompensated heart failure (HCC)    Elevated liver enzymes    AKI (acute kidney injury) (HCC) 11/26/2020   Intractable abdominal pain 11/23/2020   Chronic systolic CHF (congestive heart failure) (HCC) 11/23/2020   HTN (hypertension) 11/23/2020   Familial hypertriglyceridemia 11/23/2020   Prolonged QT interval 11/23/2020   Finger mass, left 08/12/2020   Nausea and vomiting 07/23/2020   Polycystic ovary syndrome 01/30/2020   Moderate aortic insufficiency 08/16/2019   Moderate mitral regurgitation 08/16/2019   Lipoprotein lipase deficiency, familial 06/19/2019   Microalbuminuria due to type 1 diabetes mellitus (HCC) 06/19/2019   Type 2 diabetes mellitus with hyperlipidemia (HCC) 10/10/2018   Hypothyroidism 10/10/2018   PCP:  Calton Catholic, PA-C Pharmacy:   [facility] - [place], [place] - [address] [place] [address] Phone: [phone] Fax: [phone]  [facility] [address] [place] [address] Phone: [phone] Fax: [phone]  [facility] [address] [place] [address] Phone: [phone] Fax: [phone]  [facility] [address] [place] [address] Phone: [phone] Fax: [phone]  [facility] - [place], [place] - [address] [place] [address] Phone: [phone] Fax: [phone]     Social Drivers of Health (SDOH) Social History: SDOH Screenings   Food Insecurity: No Food Insecurity (08/15/2023)  Housing: Low Risk  (08/15/2023)  Transportation Needs: No Transportation Needs (08/15/2023)  Utilities: Not At Risk (08/15/2023)  Alcohol  Screen: Low Risk  (12/10/2021)  Depression (PHQ2-9): Low Risk  (12/10/2021)  Financial Resource Strain: Low Risk  (05/31/2023)   Received from [facility]  Physical Activity: Unknown (05/31/2023)   Received from [facility]  Social Connections: Socially Integrated (05/31/2023)   Received from [facility]  Stress: No Stress Concern Present (05/31/2023)   Received from [facility]  Tobacco Use: Low Risk  (08/15/2023)   SDOH Interventions:     Readmission Risk Interventions    08/21/2023    2:55 PM 05/08/2023    4:21 PM 01/04/2023  11:45 AM  Readmission Risk Prevention Plan  Transportation Screening Complete Complete Complete  Medication Review Oceanographer) Complete Referral to Pharmacy Complete  PCP or Specialist appointment within 3-5 days of discharge Complete Complete Complete  HRI or Home Care Consult Complete Complete Complete  SW Recovery Care/Counseling Consult Complete Complete Complete  Palliative Care Screening Not Applicable Not Applicable Not Applicable  Skilled Nursing Facility Complete Not Applicable Not Applicable

## 2023-08-21 NOTE — Progress Notes (Signed)
 [facility] Progress Note  Subjective:    Vitals:   08/21/23 0700 08/21/23 0800 08/21/23 0900 08/21/23 1200  BP: (!) 130/95 132/85 120/75   Pulse: 73 75 71   Resp: 15 14 17    Temp:  97.9 F (36.6 C)  97.9 F (36.6 C)  TempSrc:  Oral  Oral  SpO2: 93% 94% 95%   Weight:      Height:        Exam: General:  adult female, lethargic, difficult to understand Eyes: periorbital edema  Heart: S1S2 no rub Lungs: clear to auscultation  Abdomen: distended, almost tight Extremities: diffuse edema 1-2+ UE's and LE's Neuro: lethargic, Ox 1.5  GU no foley       Renal-related home meds: Norvasc  5 every day Rocaltrol 0.25 mcg daily Coreg  9.375 mg bid Hydralazine  75 mg tid Sod bicarb 1300 bid Demadex  60mg  po daily Others: Creon , Topamax , Imitrex , Carafate , Crestor , Repatha , Compazine , Lyrica , PPI, MVI, Reglan , Synthroid , Corlanor , insulin , fenofibrate , Cymbalta , Fioricet , aspirin , Elavil   Date   Creat  eGFR (ml/min) 2019- 2020  0.66- 0.81 2021   1.10- 1.24 2022   0.95- 3.91 2023   1.06- 3.40 2024   1.04- 3.13 Jan- feb 2025  1.70- 2.92 21- 40 ml/min    Mar 2025  1.63- 1.78 April 2025  1.74- 2.34 28- 40 ml/min  08/07/23  2.18- 2.40 08/14/23  1.94 5/20   2.26 5/21   2.35 5/22   2.00 5/23   3.25 5/24   3.29 5/25   3.32  18 ml/min  08/21/23  2.95  21 ml/min        Summary: Jennifer [name] is a 33 y.o. female with a history of astrocytoma, atrial fibrillation, chronic systolic congestive heart failure with recovered EF, NASH, and CKD stage IV who presented to the hospital with abdominal pain and nausea. She was found to have hypertriglyceridemia felt induced by pancreatitis.    Assessment/ Plan: AKI on CKD 3b: b/l creat 1.7- 2.3 from April 2025, eGFR 28-40 ml/min. Creat here was 1.9 on admission in the setting of pancreatitis with assoc hypertriglyceridemia. Creat then rose up to 3.3 yest and down to 2.95 today. I/O are + 4 L. CXR yest showed no edema or vasc  congestion. Looks edematous and wt's are up 6kg. No nephrotoxins, did get IV lasix  from 5/24 to 5/25. BP's are soft today. Seems to be responding to IV lasix  last 2 days w/ drop in creatinine today.  Still quite swollen on exam. Will resume IV lasix  at 80 bid and dc coreg . F/u labs in am.  Hyponatremia: felt to be hypervolemic - responding to 3% saline, Na+ improved from 120 to 128 after 24 hrs. Will dc 3% now. Cont IV lasix  for hypervolemia as BP tolerates.  Acute on chronic pancreatitis: w/ severe ^TG. Per pmd. DM type 2 HTN: as above, bp's soft today and have dc'd coreg  and added prn midodrine tid for SBP < 100.       Larry Poag MD  [facility] 08/21/2023, 2:27 PM  Recent Labs  Lab 08/19/23 0744 08/20/23 0551 08/20/23 1355 08/20/23 2147 08/21/23 0449 08/21/23 0619  HGB 9.7* 9.0*  --   --  9.1*  --   ALBUMIN  3.7 3.6  --   --   --   --   CALCIUM  8.8* 8.3*   < > 8.0*  --  8.0*  PHOS 4.5  --   --   --   --   --  CREATININE 3.29* 3.32*   < > 3.19*  --  2.95*  K 3.8 3.8   < > 4.2  --  4.1   < > = values in this interval not displayed.   No results for input(s): "IRON", "TIBC", "FERRITIN" in the last 168 hours. Inpatient medications:  aspirin  EC  81 mg Oral Daily   azelastine   2 spray Each Nare Q0600   calcitRIOL  0.25 mcg Oral Daily   carvedilol   9.375 mg Oral BID   Chlorhexidine  Gluconate Cloth  6 each Topical QHS   enoxaparin  (LOVENOX ) injection  30 mg Subcutaneous Q24H   [START ON 08/22/2023] famotidine   10 mg Oral Daily   fenofibrate   54 mg Oral Daily   insulin  aspart  0-9 Units Subcutaneous TID WC   insulin  glargine-yfgn  50 Units Subcutaneous BID   ivabradine   5 mg Oral BID WC   And   ivabradine   2.5 mg Oral BID WC   levothyroxine   150 mcg Oral Q0600   lipase/protease/amylase  36,000 Units Oral TID AC   loratadine   10 mg Oral QPM   metoCLOPramide   5 mg Oral TID AC & HS   pantoprazole   40 mg Oral Daily   pregabalin   150 mg Oral Daily   rosuvastatin   40 mg Oral Daily    sodium bicarbonate   1,300 mg Oral BID   Vitamin D  (Ergocalciferol )  50,000 Units Oral Q7 days    sodium chloride  (hypertonic) 40 mL/hr at 08/21/23 0820   acetaminophen  **OR** acetaminophen , butalbital -acetaminophen -caffeine , camphor-menthol, diphenhydrAMINE , hydrALAZINE , HYDROmorphone  (DILAUDID ) injection, mouth rinse, polyethylene glycol, prochlorperazine 

## 2023-08-21 NOTE — Evaluation (Signed)
 Occupational Therapy Evaluation Patient Details Name: Jennifer [name] MRN: [id number] DOB: [birth year] Today's Date: 08/21/2023   History of Present Illness   Patient is a 33 year old female who presented on 5/20 with nausea, abdominal pain and dry heaving. Patient was admitted with hypertriglyceridemia, acute on chronic pancreatitis, and non ischemic cardiomyopathy with mildly reduced EF. PMH: gastroparesis, DM II hyperglycemia, HTN, acute renal failure, CKD IV, hyponatremia, CAD. Obesity, hypothyroidism,  astrocytoma s/p resection in 1997 with residual left-sided weakness,     Clinical Impressions Patient is a 33 year old female who was admitted for above. Patient was living at home alone per report. Currently, patient's evaluation was limited with significant orthostatic hypotension during session. Nurse called into room when BP dropped and Patient returned to bed. Patient was noted to have decreased functional activity tolerance, decreased endurance, decreased standing balance, decreased safety awareness, and decreased knowledge of AD/AE impacting participation in ADLs. Patient will need formal UE assessment during next session. Patient will benefit from continued inpatient follow up therapy, <3 hours/day.    Supine in bed 119/69 mmhg  sitting 104/22mmhg standing 62/55mmhg Supine in recliner 87/53 mmhg Supine in bed 115/60  mmhg   If plan is discharge home, recommend the following:   Two people to help with walking and/or transfers;Two people to help with bathing/dressing/bathroom;Assist for transportation;Direct supervision/assist for medications management;Assistance with cooking/housework;Direct supervision/assist for financial management;Help with stairs or ramp for entrance     Functional Status Assessment   Patient has had a recent decline in their functional status and demonstrates the ability to make significant improvements in function in a reasonable and predictable amount  of time.     Equipment Recommendations   None recommended by OT      Precautions/Restrictions   Precautions Precautions: Fall Precaution/Restrictions Comments: monitor BP Restrictions Weight Bearing Restrictions Per Provider Order: No     Mobility Bed Mobility Overal bed mobility: Needs Assistance Bed Mobility: Supine to Sit     Supine to sit: Mod assist               Balance Overall balance assessment: Needs assistance   Sitting balance-Leahy Scale: Fair     Standing balance support: Single extremity supported, No upper extremity supported Standing balance-Leahy Scale: Zero         ADL either performed or assessed with clinical judgement   ADL Overall ADL's : Needs assistance/impaired Eating/Feeding: Minimal assistance;Bed level   Grooming: Bed level;Minimal assistance   Upper Body Bathing: Bed level;Moderate assistance   Lower Body Bathing: Bed level;Total assistance   Upper Body Dressing : Bed level;Moderate assistance   Lower Body Dressing: Bed level;Total assistance   Toilet Transfer: +2 for physical assistance;+2 for safety/equipment;Stand-pivot Toilet Transfer Details (indicate cue type and reason): to recliner in room with patient having significant orthostatics. patients short stature made it so paitent needed to transition to reclienr v.s. back into bed with patient unable to sit back down safely on bed. patient was then slid back to bed with nurse present in room back to bed. Toileting- Clothing Manipulation and Hygiene: Bed level;Total assistance               Vision   Additional Comments: R eye noted to be swollen shut with patient reporting this is chronic since childhood. patient reported that she had some vision changes since admission but did not expand upon them even with further questioning.            Pertinent Vitals/Pain Pain Assessment  Pain Assessment: Faces Faces Pain Scale: Hurts little more Pain Location:  abdomen Pain Descriptors / Indicators: Grimacing Pain Intervention(s): Limited activity within patient's tolerance, Monitored during session     Extremity/Trunk Assessment Upper Extremity Assessment Upper Extremity Assessment: LUE deficits/detail LUE Deficits / Details: patient holding LUE in 90 degree elbow flexion with no attmepts to use UE with movements patietn noted to have dema on this side as well. unable to further test this with patient having significant orthostatic during session. LUE Coordination: decreased fine motor;decreased gross motor           Communication Communication Communication: No apparent difficulties   Cognition Arousal: Alert Behavior During Therapy: Flat affect               OT - Cognition Comments: patient was not forthcoming and short with answers about PLOF during session. patient able to follow commands during session.                 Following commands: Intact                  Home Living Family/patient expects to be discharged to:: Private residence Living Arrangements: Non-relatives/Friends Available Help at Discharge: Family;Available PRN/intermittently Type of Home: Apartment Home Access: Stairs to enter Entrance Stairs-Number of Steps: flight Entrance Stairs-Rails: Right Home Layout: One level     Bathroom Shower/Tub: Chief Strategy Officer: Standard     Home Equipment: None          Prior Functioning/Environment Prior Level of Function : Independent/Modified Independent             Mobility Comments: no use of AD ADLs Comments: drives, does online grocery shopping, watches TV, indep with ADLs    OT Problem List: Impaired balance (sitting and/or standing);Decreased knowledge of precautions;Decreased safety awareness;Decreased knowledge of use of DME or AE;Cardiopulmonary status limiting activity;Obesity;Impaired UE functional use;Decreased activity tolerance;Decreased coordination;Impaired  vision/perception;Pain;Impaired tone   OT Treatment/Interventions: Self-care/ADL training;DME and/or AE instruction;Therapeutic activities;Balance training;Energy conservation;Patient/family education      OT Goals(Current goals can be found in the care plan section)   Acute Rehab OT Goals OT Goal Formulation: With patient Time For Goal Achievement: 09/04/23 Potential to Achieve Goals: Fair   OT Frequency:  Min 2X/week    Co-evaluation PT/OT/SLP Co-Evaluation/Treatment: Yes Reason for Co-Treatment: To address functional/ADL transfers;For patient/therapist safety PT goals addressed during session: Mobility/safety with mobility OT goals addressed during session: ADL's and self-care      AM-PAC OT "6 Clicks" Daily Activity     Outcome Measure Help from another person eating meals?: A Little Help from another person taking care of personal grooming?: A Little Help from another person toileting, which includes using toliet, bedpan, or urinal?: Total Help from another person bathing (including washing, rinsing, drying)?: Total Help from another person to put on and taking off regular upper body clothing?: A Lot Help from another person to put on and taking off regular lower body clothing?: Total 6 Click Score: 11   End of Session Equipment Utilized During Treatment: Gait belt Nurse Communication: Other (comment) (called into room when patients BP dropped)  Activity Tolerance: Treatment limited secondary to medical complications (Comment) Patient left: in bed;with call bell/phone within reach;with nursing/sitter in room  OT Visit Diagnosis: Unsteadiness on feet (R26.81);Other abnormalities of gait and mobility (R26.89);Muscle weakness (generalized) (M62.81)                Time: 1000-1021 OT Time Calculation (min): 21 min Charges:  OT General Charges $OT Visit: 1 Visit OT Evaluation $OT Eval Moderate Complexity: 1 Mod  Judithe Keetch OTR/L, MS Acute Rehabilitation Department Office#  [phone]   Jame Maze 08/21/2023, 12:09 PM

## 2023-08-21 NOTE — Progress Notes (Addendum)
 PROGRESS NOTE    Jennifer [name]  ONG:[id number] DOB: Feb 13, 1991 DOA: 08/15/2023 PCP: Calton Catholic, PA-C    Chief Complaint  Patient presents with   Abdominal Pain    Brief Narrative:  Patient is a 33 year old female history of paroxysmal A-fib, astrocytoma, chronic systolic CHF with recovered EF, history of prolonged QT interval, lipoprotein deficiency, familial hypercholesterolemia, cholesterolosis, NASH, stage IV CKD, class I obesity, type 2 diabetes, hypertension hypothyroidism, recurrent pancreatitis, gastroparesis, peripheral neuropathy, history of pancytopenia, GERD, PCOS presented to the ED with severe abdominal pain nausea dry heaves.  Patient stated abdominal pain occurred when she woke up and took a small sip of juice which triggered her pain immediately.  Patient also noted with some loose stools.  Patient seen in the ED, CBC, lipase, serum pregnancy test unremarkable.  CMP with a BUN of 47, creatinine of 2.26, AST of 87, ALT of 117.  Patient noted to have a significantly elevated triglyceride level of 2288 patient admitted to stepdown unit, placed on the insulin  drip due to concerns for possible pancreatitis induced hypertriglyceridemia.   Assessment & Plan:   Principal Problem:   Intractable abdominal pain Active Problems:   Familial hypertriglyceridemia   AKI (acute kidney injury) (HCC)   Type 2 diabetes mellitus with hyperlipidemia (HCC)   Hypothyroidism   Chronic systolic CHF (congestive heart failure) (HCC)   HTN (hypertension)   CKD (chronic kidney disease) stage 4, GFR 15-29 ml/min (HCC)   Pulmonary hypertension (HCC)   NASH (nonalcoholic steatohepatitis)   Obesity (BMI 30-39.9)   Hyponatremia   Acute on chronic pancreatitis (HCC)   Gastroparesis due to DM (HCC)   Hepatic steatosis   Coronary artery disease involving native coronary artery of native heart without angina pectoris   Nocturnal hypoxemia   Chronic pancreatitis (HCC)   SOB (shortness of  breath)   Dizziness  #1 familial hypertriglyceridemia/acute on chronic pancreatitis secondary to hypertriglyceridemia - Patient noted to have presented with sudden onset epigastric abdominal pain nausea, dry heaves, patient with a history of chronic pancreatitis. - Triglyceride level noted at 2288 on admission which is trending down currently at 635 from 569 from 648 from 716 from 896 from 1258 from 1256 from 1412 from 1534 from 1761 from 2288 on admission. - Lipase level noted at 26. - LFTs noted to be elevated but trending down, patient with history of NASH. - Right upper quadrant ultrasound done with small volume ascites without pocket of fluid large enough to allow for safe approach for paracentesis. - Patient with clinical improvement with no further nausea or vomiting.   - Due to complaints of abdominal pain diet was downgraded to a clear liquid diet, advance to a full liquid diet which patient tolerated.  -Advance to carb modified diet.   - IV insulin  drip rate was increased on 08/17/2023 however due to dropping CBGs was decreased back on 08/17/2023.  CBGs have improved.   - Increased insulin  drip rate back up on 08/18/2023. -Triglycerides levels improved and patient noted to be hyponatremic and as such transitioned to subcutaneous Semglee  50 units twice daily. -IV fluids discontinued. -Continue fenofibrate , Crestor . -Change CBGs to [facility] and at bedtime. -Supportive care.  2.  Gastroparesis due to diabetes mellitus - Continue Reglan  5 mg AC and at bedtime.   3.  NASH/hepatic steatosis -Outpatient follow-up with gastroenterology.  4.  Diabetes mellitus type 2 with hyperglycemia with long-term insulin  use -Hemoglobin A1c 6.1 (2/8/20250 - CBG noted at 174 this morning. - Patient was on insulin   drip secondary to problem #1. -Insulin  drip dose adjusted on 08/17/2023 with blood glucose levels going down to 45 and as such insulin  drip dose decreased. -CBGs improved and insulin  drip increased  on 08/18/2023.   - Diet also advanced to a regular diet due to low CBGs early on and downgraded to clear liquid diet on 08/19/2023 due to patient's complaints of epigastric abdominal pain. -Diet has been advanced to carb modified diet this morning. -Patient transitioned off insulin  drip currently on Semglee  50 units twice daily, SSI. -Continue Reglan . -Continue Lyrica  at a decreased dose of 150 mg daily due to renal function. -Change CBGs to [facility] and at bedtime.  5.  Hypertension - Continue hydralazine , Coreg .   - Norvasc  discontinued per nephrology to avoid hypotension.  6.  Pulmonary hypertension - Continue home medications.  7.  Acute renal failure on CKD stage IV/metabolic acidosis -Baseline creatinine 1.9-2.5. -Patient noted with worsening renal function with creatinine currently at 2.95 from 3.19 from 3.15 from 3.32 from 3.29 from 3.25 from 2.0 over the past 24 hours. -BP noted to be soft on 08/18/2023, BP has improved this morning with systolic in the 120s.  -Concern for prerenal azotemia. - Renal ultrasound negative for hydronephrosis.  - Urinalysis nitrite negative, leukocytes negative, negative for protein.  Urine sodium noted at 15, urine creatinine of 108.  - Patient denies any further epigastric abdominal pain.   - Patient complained of feeling bloated to nephrology and started on IV Lasix .   - Urine output recorded over 2 L over the past 24 hours. -D5 normal saline discontinued. -Patient placed on 3% saline nebs due to hyponatremia. -IV Lasix  discontinued yesterday after dose was given on 08/20/2023. - Continue home regimen of bicarb tablets, calcitriol. -Per nephrology.  8.  Hyponatremia -??  Etiology. -Patient with worsening hyponatremia on 08/20/2023.. - Concern for hypovolemic hyponatremia versus due to increased free water  as patient noted to have been on D10 infusion. -D10 infusion was changed to half-normal saline which was subsequently changed to D5 normal saline  which has been discontinued.  -Daily weights. - Urine osmolality pending  - Urine sodium < 10, urine creatinine 80. - Serum osmolality of 290. - TSH at 6.758. - Patient states thyroid  dose recently adjusted. -Elavil  and Cymbalta  held by nephrology. -Due to worsening hyponatremia with sodium as low as 120, patient seen by nephrology and started on 3% saline at 30 cc/h with serial sodium levels. -Sodium at 127 this morning and seems to be responding to saline. -Sodium levels being checked every 4 hours. -Per nephrology.  9.  CAD/chronic systolic CHF - Continue home cardiac regimen of aspirin , amlodipine , carvedilol , Demadex . - Fenofibrate  and statin resumed on 08/20/2023.  - 2D echo obtained with a EF of 45 to 50%, left ventricular global hypokinesis. - Cardiology consulted due to abnormal 2D echo for further evaluation and recommendations.  - Patient seen in consultation by cardiology and recommend continuation of cardiac medications/heart failure medications as noted in the note.  Patient noted not to tolerate spironolactone  due to hyperkalemia, no Jardiance with frequent GU infections and CKD limits further titration. -Patient with some complaints of shortness of breath on 08/20/2023 which has since improved. - Chest x-ray negative for any acute infiltrates. - BNP noted at 278.7.  -Patient was on IV Lasix  which have been held per nephrology. - Will defer resumption of diuretics to nephrology and cardiology. - Appreciate cardiology input and recommendations.  9. nocturnal hypoxemia - Continue O2 at bedtime.   10.  Obesity -BMI 32.24 kg/m. - Lifestyle modification. - Outpatient follow-up with PCP.  11.  Hypothyroidism - TSH 6.758.   - Patient states Synthroid  dose was adjusted 1 week prior to admission. -Continue current Synthroid  dose. -Outpatient follow-up with primary endocrinologist.  12.  Dizziness/orthostatic hypotension -Patient with some complaints of dizziness on  08/20/2023. - Orthostatics checked on 08/20/2023 were negative.  - Per RN patient working with PT this morning noted to have some complaints of dizziness and orthostatic with supine blood pressure 119/69>>> sitting 104/53>>> standing 62/33.  - Hold antihypertensive blood pressure medications this morning.  - Will give a dose of IV albumin .  - Repeat orthostatics in the AM.   13.  Shortness of breath -Some complaints of shortness of breath on 08/20/2023, with some improvement today. - Chest x-ray done unremarkable. - Received a dose of IV Lasix  yesterday. - Cardiology following.   DVT prophylaxis: Lovenox  Code Status: Full Family Communication: Updated patient.  No family at bedside. Disposition: Likely home when clinically improved and back to baseline.  Status is: Inpatient The patient will require care spanning > 2 midnights and should be moved to inpatient because: Severity of illness   Consultants:  Cardiology: Dr.Schumann 08/17/2023 Nephrology: Dr. Yvonnie Heritage 08/18/2023  Procedures:  Right upper quadrant ultrasound 08/15/2023 2D echo 08/16/2023 Renal ultrasound 08/18/2023   Antimicrobials: Anti-infectives (From admission, onward)    None         Subjective: Patient lying in bed.  Cardiology has bedside.  Tolerating full liquids.  Still with some upper abdominal discomfort however states overall improved since admission.  Asking for diet to be advanced.  Feels some improvement with shortness of breath.  Denies any chest pain.  States having bowel movements.  Objective: Vitals:   08/21/23 0600 08/21/23 0700 08/21/23 0800 08/21/23 0900  BP: 116/64 (!) 130/95 132/85 120/75  Pulse: 73 73 75 71  Resp: 19 15 14 17   Temp:   97.9 F (36.6 C)   TempSrc:   Oral   SpO2: 95% 93% 94% 95%  Weight:      Height:        Intake/Output Summary (Last 24 hours) at 08/21/2023 0951 Last data filed at 08/21/2023 0700 Gross per 24 hour  Intake 1266.61 ml  Output 1900 ml  Net -633.39 ml    Filed Weights   08/19/23 1001 08/20/23 0106 08/21/23 0500  Weight: 77.2 kg 75.6 kg 76 kg    Examination:  General exam: NAD. Respiratory system: Lungs clear to auscultation bilaterally.  No wheezes, no crackles, no rhonchi.  Fair air movement.  Speaking in full sentences.  Cardiovascular system: RRR no murmurs rubs or gallops.  No JVD.  No pitting lower extremity edema.  Gastrointestinal system: Abdomen is soft, mildly distended, nontender to palpation epigastrium.  Positive bowel sounds.  No rebound.  No guarding.  Central nervous system: Alert and oriented. No focal neurological deficits. Extremities: Symmetric 5 x 5 power. Skin: No rashes, lesions or ulcers Psychiatry: Judgement and insight appear normal. Mood & affect appropriate.     Data Reviewed: I have personally reviewed following labs and imaging studies  CBC: Recent Labs  Lab 08/14/23 1124 08/15/23 0641 08/17/23 0327 08/18/23 0322 08/19/23 0744 08/20/23 0551 08/21/23 0449  WBC 4.9   < > 7.1 6.9 7.5 7.7 7.0  NEUTROABS 2.0  --  3.5  --   --   --   --   HGB 12.3   < > 10.2* 10.4* 9.7* 9.0* 9.1*  HCT 36.2   < >  33.1* 33.0* 30.5* 27.6* 28.6*  MCV 88.8   < > 95.1 97.6 96.5 94.5 96.3  PLT 144.0*   < > 124* 112* 99* 96* 104*   < > = values in this interval not displayed.    Basic Metabolic Panel: Recent Labs  Lab 08/17/23 0327 08/18/23 0322 08/19/23 0744 08/20/23 0551 08/20/23 1355 08/20/23 1644 08/20/23 2147 08/21/23 0044 08/21/23 0449 08/21/23 0619 08/21/23 0859  NA 137 131* 128* 121* 120*   < > 123* 124* 126* 125* 127*  K 3.8 3.8 3.8 3.8 4.2  --  4.2  --   --  4.1  --   CL 104 97* 97* 91* 89*  --  93*  --   --  95*  --   CO2 22 22 19* 19* 19*  --  19*  --   --  15*  --   GLUCOSE 88 272* 78 81 172*  --  209*  --   --  182*  --   BUN 37* 45* 48* 46* 43*  --  45*  --   --  46*  --   CREATININE 2.00* 3.25* 3.29* 3.32* 3.15*  --  3.19*  --   --  2.95*  --   CALCIUM  8.9 8.8* 8.8* 8.3* 8.2*  --  8.0*  --    --  8.0*  --   MG 2.2 2.2  --   --   --   --   --   --   --   --   --   PHOS  --   --  4.5  --   --   --   --   --   --   --   --    < > = values in this interval not displayed.    GFR: Estimated Creatinine Clearance: 23.2 mL/min (A) (by C-G formula based on SCr of 2.95 mg/dL (H)).  Liver Function Tests: Recent Labs  Lab 08/15/23 0641 08/16/23 0340 08/17/23 0327 08/18/23 0322 08/19/23 0744 08/20/23 0551  AST 87* 85* 83* 56*  --  81*  ALT 117* 98* 94* 81*  --  85*  ALKPHOS 98 84 84 89  --  77  BILITOT 0.9 0.7 0.8 0.5  --  0.8  PROT 7.6 6.9 7.1 7.1  --  7.0  ALBUMIN  4.0 3.8 3.9 3.7 3.7 3.6    CBG: Recent Labs  Lab 08/20/23 1613 08/20/23 1939 08/21/23 0028 08/21/23 0338 08/21/23 0754  GLUCAP 225* 226* 164* 174* 185*     Recent Results (from the past 240 hours)  MRSA Next Gen by PCR, Nasal     Status: None   Collection Time: 08/15/23  5:21 PM   Specimen: Nasal Mucosa; Nasal Swab  Result Value Ref Range Status   MRSA by PCR Next Gen NOT DETECTED NOT DETECTED Final    Comment: (NOTE) The GeneXpert MRSA Assay (FDA approved for NASAL specimens only), is one component of a comprehensive MRSA colonization surveillance program. It is not intended to diagnose MRSA infection nor to guide or monitor treatment for MRSA infections. Test performance is not FDA approved in patients less than 17 years old. Performed at [facility], [address] [place], [place] [address]          Radiology Studies: DG CHEST PORT 1 VIEW Result Date: 08/20/2023 CLINICAL DATA:  [id number] SOB (shortness of breath) [id number] EXAM: PORTABLE CHEST 1 VIEW COMPARISON:  July 02, 2023 FINDINGS: The cardiomediastinal  silhouette is unchanged and enlarged in contour.CardioMEMS. No pleural effusion. No pneumothorax. No acute pleuroparenchymal abnormality. IMPRESSION: No acute cardiopulmonary abnormality. Electronically Signed   By: Clancy Crimes M.D.   On: 08/20/2023 11:55          Scheduled Meds:  aspirin  EC  81 mg Oral Daily   azelastine   2 spray Each Nare Q0600   calcitRIOL  0.25 mcg Oral Daily   carvedilol   9.375 mg Oral BID   Chlorhexidine  Gluconate Cloth  6 each Topical QHS   enoxaparin  (LOVENOX ) injection  30 mg Subcutaneous Q24H   famotidine   20 mg Oral Daily   fenofibrate   54 mg Oral Daily   hydrALAZINE   75 mg Oral Q8H   insulin  aspart  0-9 Units Subcutaneous Q4H   insulin  glargine-yfgn  50 Units Subcutaneous BID   ivabradine   5 mg Oral BID WC   And   ivabradine   2.5 mg Oral BID WC   levothyroxine   150 mcg Oral Q0600   lipase/protease/amylase  36,000 Units Oral TID AC   loratadine   10 mg Oral QPM   metoCLOPramide   5 mg Oral TID AC & HS   pantoprazole   40 mg Oral Daily   pregabalin   150 mg Oral Daily   rosuvastatin   40 mg Oral Daily   sodium bicarbonate   1,300 mg Oral BID   topiramate   50 mg Oral BID   Vitamin D  (Ergocalciferol )  50,000 Units Oral Q7 days   Continuous Infusions:  insulin  Stopped (08/20/23 1149)   sodium chloride  (hypertonic) 40 mL/hr at 08/21/23 0820     LOS: 5 days    Time spent: 40 minutes    Hilda Lovings, MD [facility]   To contact the attending provider between 7A-7P or the covering provider during after hours 7P-7A, please log into the web site [email or web address] and access using universal [place] password for that web site. If you do not have the password, please call the hospital operator.  08/21/2023, 9:51 AM

## 2023-08-21 NOTE — [place] FL2 (Signed)
 [place]  MEDICAID FL2 LEVEL OF CARE FORM     IDENTIFICATION  Patient Name: Jennifer [name] Birthdate: January 07, 1991 Sex: female Admission Date (Current Location): 08/15/2023  [facility] and [place] Number:  Producer, television/film/video and Address:  [facility],  [address], [place] [address]      Provider Number: [id number]  Attending Physician Name and Address:  Armenta Landau, MD  Relative Name and Phone Number:  MARRIANNE, SICA (Mother)  [phone] (Mobile)    Current Level of Care: Hospital Recommended Level of Care: Skilled Nursing Facility Prior Approval Number:    Date Approved/Denied:   PASRR Number: [id number]  Discharge Plan: SNF    Current Diagnoses: Patient Active Problem List   Diagnosis Date Noted   SOB (shortness of breath) 08/20/2023   Dizziness 08/20/2023   Chronic pancreatitis (HCC) 08/15/2023   Witnessed episode of apnea 06/27/2023   Nocturnal hypoxemia 06/27/2023   Abdominal pain 05/05/2023   Pseudohyponatremia 04/10/2023   Metabolic acidosis 04/10/2023   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 coli UTI (urinary tract infection) 06/01/2022   DKA (diabetic ketoacidosis) (HCC) 06/01/2022   ASCUS of cervix with negative high risk HPV 05/31/2022   Acute on chronic pancreatitis (HCC) 05/29/2022   Hypokalemia 03/23/2022   COVID 03/15/2022   COVID-19 virus infection 03/14/2022   Hyponatremia 03/14/2022   Positive D dimer 03/14/2022   Stage 3b  chronic kidney disease (CKD) (HCC) 03/14/2022   Pulmonary hypertension (HCC) 02/06/2022   NASH (nonalcoholic steatohepatitis) 02/06/2022   Obesity (BMI 30-39.9) 02/06/2022   Heart failure (HCC) 12/03/2021   Acute on chronic systolic CHF (congestive heart failure) (HCC) 12/02/2021   Elevated troponin 12/02/2021   Pancytopenia (HCC) 12/02/2021   Amenorrhea 12/02/2021   Hepatic steatosis 09/27/2021   Hyperosmolar hyperglycemic state (HHS) (HCC) 08/26/2021   Small intestinal bacterial overgrowth (SIBO) 08/26/2021   Lower extremity edema 08/26/2021   Hyperkalemia 08/14/2021   Hx of insulin  dependent diabetes mellitus 08/14/2021   CKD (chronic kidney disease) stage 4, GFR 15-29 ml/min (HCC) 05/13/2021   Afib (HCC) 05/12/2021   Gastroparesis due to DM (HCC) 05/12/2021   Abdominal distension 05/12/2021   Chest pain 03/10/2021   Acute on chronic combined systolic and diastolic CHF (congestive heart failure) (HCC) 02/09/2021   History of astrocytoma of brain 02/09/2021   Type 2 diabetes mellitus with hyperglycemia, with long-term current use of insulin  (HCC) 01/25/2021   Diarrhea    Elevated transaminase level    Orthostatic hypotension 11/28/2020   Acute combined systolic and diastolic heart failure (HCC)    Nonischemic cardiomyopathy (HCC)    Acute decompensated heart failure (HCC)    Elevated liver enzymes    AKI (acute kidney injury) (HCC) 11/26/2020   Intractable abdominal pain 11/23/2020   Chronic systolic CHF (congestive heart failure) (HCC) 11/23/2020   HTN (hypertension) 11/23/2020   Familial hypertriglyceridemia 11/23/2020   Prolonged QT interval 11/23/2020   Finger mass, left 08/12/2020   Nausea and vomiting 07/23/2020   Polycystic ovary syndrome 01/30/2020   Moderate aortic insufficiency 08/16/2019  Moderate mitral regurgitation 08/16/2019   Lipoprotein lipase deficiency, familial 06/19/2019   Microalbuminuria due to type 1 diabetes mellitus (HCC) 06/19/2019   Type 2  diabetes mellitus with hyperlipidemia (HCC) 10/10/2018   Hypothyroidism 10/10/2018    Orientation RESPIRATION BLADDER Height & Weight     Self, Time, Situation, Place  O2 Continent Weight: 76 kg Height:  4\' 9"  (144.8 cm)  BEHAVIORAL SYMPTOMS/MOOD NEUROLOGICAL BOWEL NUTRITION STATUS      Continent Diet (carb modified)  AMBULATORY STATUS COMMUNICATION OF NEEDS Skin   Limited Assist Verbally Normal                       Personal Care Assistance Level of Assistance  Bathing, Feeding, Dressing Bathing Assistance: Limited assistance Feeding assistance: Limited assistance Dressing Assistance: Limited assistance     Functional Limitations Info  Sight, Speech, Hearing Sight Info: Adequate Hearing Info: Adequate Speech Info: Adequate    SPECIAL CARE FACTORS FREQUENCY  PT (By licensed PT), OT (By licensed OT)     PT Frequency: 5x/wk OT Frequency: 5x/wk            Contractures Contractures Info: Not present    Additional Factors Info  Code Status, Allergies, Psychotropic Code Status Info: Full Code Allergies Info: Icosapent  Ethyl (Epa Ethyl Ester) (Fish), Ketamine, [place], Morphine , Penicillins, Mushroom, Shellfish Allergy, Fentanyl , Prednisone Psychotropic Info: N/A         Current Medications (08/21/2023):  This is the current hospital active medication list Current Facility-Administered Medications  Medication Dose Route Frequency Provider Last Rate Last Admin   acetaminophen  (TYLENOL ) tablet 650 mg  650 mg Oral Q6H PRN Danice Dural, MD   650 mg at 08/19/23 1311   Or   acetaminophen  (TYLENOL ) suppository 650 mg  650 mg Rectal Q6H PRN Danice Dural, MD       aspirin  EC tablet 81 mg  81 mg Oral Daily Armenta Landau, MD   81 mg at 08/21/23 0932   azelastine  (ASTELIN ) 0.1 % nasal spray 2 spray  2 spray Each Nare Q0600 Armenta Landau, MD   2 spray at 08/21/23 0602   butalbital -acetaminophen -caffeine  (FIORICET ) 50-325-40 MG per tablet 1 tablet  1  tablet Oral Daily PRN Armenta Landau, MD       calcitRIOL (ROCALTROL) capsule 0.25 mcg  0.25 mcg Oral Daily Armenta Landau, MD   0.25 mcg at 08/21/23 0932   camphor-menthol (SARNA) lotion   Topical PRN Chavez, Abigail, NP   Given at 08/18/23 2131   carvedilol  (COREG ) tablet 9.375 mg  9.375 mg Oral BID Armenta Landau, MD   9.375 mg at 08/21/23 0932   Chlorhexidine  Gluconate Cloth 2 % PADS 6 each  6 each Topical QHS Armenta Landau, MD   6 each at 08/20/23 2201   diphenhydrAMINE  (BENADRYL ) injection 12.5 mg  12.5 mg Intravenous Q6H PRN Danice Dural, MD   12.5 mg at 08/19/23 4098   enoxaparin  (LOVENOX ) injection 30 mg  30 mg Subcutaneous Q24H Danice Dural, MD   30 mg at 08/20/23 2202   [START ON 08/22/2023] famotidine  (PEPCID ) tablet 10 mg  10 mg Oral Daily Armenta Landau, MD       fenofibrate  tablet 54 mg  54 mg Oral Daily Armenta Landau, MD   54 mg at 08/21/23 0932   hydrALAZINE  (APRESOLINE ) injection 10 mg  10 mg Intravenous Q4H PRN Danice Dural, MD   10 mg at 08/16/23 1630  HYDROmorphone  (DILAUDID ) injection 0.5 mg  0.5 mg Intravenous Q2H PRN Danice Dural, MD   0.5 mg at 08/21/23 1326   insulin  aspart (novoLOG ) injection 0-9 Units  0-9 Units Subcutaneous TID WC Armenta Landau, MD   8 Units at 08/21/23 1153   insulin  glargine-yfgn (SEMGLEE ) injection 50 Units  50 Units Subcutaneous BID Armenta Landau, MD   50 Units at 08/21/23 1610   ivabradine  (CORLANOR ) tablet 5 mg  5 mg Oral BID WC Armenta Landau, MD   5 mg at 08/21/23 9604   And   ivabradine  (CORLANOR ) tablet 2.5 mg  2.5 mg Oral BID WC Armenta Landau, MD   2.5 mg at 08/21/23 5409   levothyroxine  (SYNTHROID ) tablet 150 mcg  150 mcg Oral Q0600 Armenta Landau, MD   150 mcg at 08/21/23 0602   lipase/protease/amylase (CREON ) capsule 36,000 Units  36,000 Units Oral TID Letta Raw, MD   36,000 Units at 08/21/23 1154   loratadine  (CLARITIN ) tablet 10 mg  10 mg Oral QPM  Armenta Landau, MD   10 mg at 08/20/23 1812   metoCLOPramide  (REGLAN ) tablet 5 mg  5 mg Oral TID AC & HS Armenta Landau, MD   5 mg at 08/21/23 1154   Oral care mouth rinse  15 mL Mouth Rinse PRN Danice Dural, MD       pantoprazole  (PROTONIX ) EC tablet 40 mg  40 mg Oral Daily Armenta Landau, MD   40 mg at 08/21/23 0932   polyethylene glycol (MIRALAX  / GLYCOLAX ) packet 17 g  17 g Oral Daily PRN Armenta Landau, MD       pregabalin  (LYRICA ) capsule 150 mg  150 mg Oral Daily Armenta Landau, MD   150 mg at 08/21/23 0932   prochlorperazine  (COMPAZINE ) injection 5 mg  5 mg Intravenous Q4H PRN Danice Dural, MD       rosuvastatin  (CRESTOR ) tablet 40 mg  40 mg Oral Daily Armenta Landau, MD   40 mg at 08/21/23 0932   sodium bicarbonate  tablet 1,300 mg  1,300 mg Oral BID Armenta Landau, MD   1,300 mg at 08/21/23 0932   sodium chloride  (hypertonic) 3 % solution   Intravenous Continuous Nan Aver, MD 40 mL/hr at 08/21/23 0820 New Bag at 08/21/23 0820   Vitamin D  (Ergocalciferol ) (DRISDOL ) 1.25 MG (50000 UNIT) capsule 50,000 Units  50,000 Units Oral Q7 days Armenta Landau, MD   50,000 Units at 08/20/23 8119     Discharge Medications: Please see discharge summary for a list of discharge medications.  Relevant Imaging Results:  Relevant Lab Results:   Additional Information SSN: [id number]  Bari Leys, RN

## 2023-08-21 NOTE — Evaluation (Signed)
 Physical Therapy Evaluation Patient Details Name: Jennifer [name] MRN: [id number] DOB: [birth year] Today's Date: 08/21/2023  History of Present Illness  Patient is a 33 year old female who presented on 5/20 with nausea, abdominal pain and dry heaving. Patient was admitted with hypertriglyceridemia, acute on chronic pancreatitis, and non ischemic cardiomyopathy with mildly reduced EF. PMH: gastroparesis, DM II hyperglycemia, HTN, acute renail failure, CKD IV, hyponatremia, CAD. Obesity, hypothyroidism. L foot drop,  Clinical Impression    Pt admitted with above diagnosis.  Pt currently with functional limitations due to the deficits listed below (see PT Problem List). Pt in bed when therapist arrived. Pt agreeable to therapy intervention. Pt reports living at home alone. Pt limited in participation with therapy today due to severe symptomatic orthostatic hypotension, please see below. Mod A for supine to sit, min A for sit to stand  from EOB, min a x 2 with HHA to complete SPT to recliner, total A x 3 to return to bed. Pt left in bed, all needs in place, nursing staff aware of Bp findings and pt response. Patient will benefit from continued inpatient follow up therapy, <3 hours/day.  Pt will benefit from acute skilled PT to increase their independence and safety with mobility to allow discharge.   Supine in bed 119/69 mmhg  sitting 104/81mmhg standing 62/70mmhg Supine in recliner 87/53 mmhg Supine in bed 115/60  mmhg      If plan is discharge home, recommend the following: Two people to help with walking and/or transfers;A lot of help with bathing/dressing/bathroom;Assistance with cooking/housework;Assist for transportation;Help with stairs or ramp for entrance   Can travel by private vehicle   No    Equipment Recommendations Other (comment) (TBD, if recommendation RW will need youth)  Recommendations for Other Services       Functional Status Assessment Patient has had a recent decline in  their functional status and demonstrates the ability to make significant improvements in function in a reasonable and predictable amount of time.     Precautions / Restrictions Precautions Precautions: Fall Precaution/Restrictions Comments: monitor BP Restrictions Weight Bearing Restrictions Per Provider Order: No      Mobility  Bed Mobility Overal bed mobility: Needs Assistance Bed Mobility: Supine to Sit     Supine to sit: Mod assist     General bed mobility comments: increased time, A for trunk    Transfers Overall transfer level: Needs assistance Equipment used: 1 person hand held assist Transfers: Sit to/from Stand, Bed to chair/wheelchair/BSC Sit to Stand: Min assist, From elevated surface Stand pivot transfers: Min assist, +2 physical assistance, +2 safety/equipment         General transfer comment: pt reported dizziness once in standing, noted postural sway and slight B knee and hip flexion with pt assisted to recliner with A x 2.    Ambulation/Gait               General Gait Details: NT due to hypotension  Stairs            Wheelchair Mobility     Tilt Bed    Modified Rankin (Stroke Patients Only)       Balance           Standing balance support: Single extremity supported, During functional activity                                 Pertinent Vitals/Pain Pain Assessment Pain Assessment: Faces  Faces Pain Scale: Hurts little more Pain Location: abdomen Pain Descriptors / Indicators: Grimacing Pain Intervention(s): Limited activity within patient's tolerance, Monitored during session    Home Living Family/patient expects to be discharged to:: Private residence Living Arrangements: Non-relatives/Friends Available Help at Discharge: Family;Available PRN/intermittently Type of Home: Apartment Home Access: Stairs to enter Entrance Stairs-Rails: Right Entrance Stairs-Number of Steps: flight   Home Layout: One  level Home Equipment: None      Prior Function Prior Level of Function : Independent/Modified Independent;Driving             Mobility Comments: IND no AD for all ADLs, self care tasks, IADLs, driving ADLs Comments: drives, does online grocery shopping, watches TV, indep with ADLs     Extremity/Trunk Assessment   Upper Extremity Assessment Upper Extremity Assessment: Defer to OT evaluation LUE Deficits / Details: patient holding LUE in 90 degree elbow flexion with no attmepts to use UE with movements patietn noted to have dema on this side as well. unable to further test this with patient having significant orthostatic during session. LUE Coordination: decreased fine motor;decreased gross motor    Lower Extremity Assessment Lower Extremity Assessment: Generalized weakness       Communication   Communication Communication: No apparent difficulties    Cognition Arousal: Alert Behavior During Therapy: Flat affect   PT - Cognitive impairments: No apparent impairments                         Following commands: Intact       Cueing       General Comments      Exercises     Assessment/Plan    PT Assessment Patient needs continued PT services  PT Problem List Decreased strength;Decreased range of motion;Decreased activity tolerance;Decreased balance;Decreased mobility;Decreased coordination;Pain       PT Treatment Interventions DME instruction;Gait training;Stair training;Functional mobility training;Therapeutic activities;Therapeutic exercise;Balance training;Neuromuscular re-education;Patient/family education    PT Goals (Current goals can be found in the Care Plan section)  Acute Rehab PT Goals Patient Stated Goal: to go home PT Goal Formulation: With patient Time For Goal Achievement: 09/04/23 Potential to Achieve Goals: Good    Frequency Min 3X/week     Co-evaluation PT/OT/SLP Co-Evaluation/Treatment: Yes Reason for Co-Treatment: To  address functional/ADL transfers;For patient/therapist safety PT goals addressed during session: Mobility/safety with mobility OT goals addressed during session: ADL's and self-care       AM-PAC PT "6 Clicks" Mobility  Outcome Measure Help needed turning from your back to your side while in a flat bed without using bedrails?: A Little Help needed moving from lying on your back to sitting on the side of a flat bed without using bedrails?: A Lot Help needed moving to and from a bed to a chair (including a wheelchair)?: A Little Help needed standing up from a chair using your arms (e.g., wheelchair or bedside chair)?: A Little Help needed to walk in hospital room?: Total Help needed climbing 3-5 steps with a railing? : Total 6 Click Score: 13    End of Session Equipment Utilized During Treatment: Gait belt Activity Tolerance: Other (comment) (orthostatic hypotension) Patient left: in bed;with call bell/phone within reach;with nursing/sitter in room Nurse Communication: Mobility status;Other (comment) (Bp findings) PT Visit Diagnosis: Unsteadiness on feet (R26.81);Other abnormalities of gait and mobility (R26.89);Muscle weakness (generalized) (M62.81);Difficulty in walking, not elsewhere classified (R26.2);Pain Pain - Right/Left:  (abdomen)    Time: 1610-9604 PT Time Calculation (min) (ACUTE ONLY): 23 min  Charges:   PT Evaluation $PT Eval Low Complexity: 1 Low   PT General Charges $$ ACUTE PT VISIT: 1 Visit         Cary Clarks, PT Acute Rehab   Annalee Kiang 08/21/2023, 12:31 PM

## 2023-08-22 DIAGNOSIS — N179 Acute kidney failure, unspecified: Secondary | ICD-10-CM | POA: Diagnosis not present

## 2023-08-22 DIAGNOSIS — R109 Unspecified abdominal pain: Secondary | ICD-10-CM | POA: Diagnosis not present

## 2023-08-22 DIAGNOSIS — N189 Chronic kidney disease, unspecified: Secondary | ICD-10-CM | POA: Diagnosis not present

## 2023-08-22 DIAGNOSIS — I255 Ischemic cardiomyopathy: Secondary | ICD-10-CM

## 2023-08-22 DIAGNOSIS — E039 Hypothyroidism, unspecified: Secondary | ICD-10-CM | POA: Diagnosis not present

## 2023-08-22 DIAGNOSIS — E1143 Type 2 diabetes mellitus with diabetic autonomic (poly)neuropathy: Secondary | ICD-10-CM | POA: Diagnosis not present

## 2023-08-22 DIAGNOSIS — I10 Essential (primary) hypertension: Secondary | ICD-10-CM | POA: Diagnosis not present

## 2023-08-22 DIAGNOSIS — K861 Other chronic pancreatitis: Secondary | ICD-10-CM | POA: Diagnosis not present

## 2023-08-22 LAB — CBC
HCT: 27.7 % — ABNORMAL LOW (ref 36.0–46.0)
Hemoglobin: 8.7 g/dL — ABNORMAL LOW (ref 12.0–15.0)
MCH: 31.1 pg (ref 26.0–34.0)
MCHC: 31.4 g/dL (ref 30.0–36.0)
MCV: 98.9 fL (ref 80.0–100.0)
Platelets: 102 10*3/uL — ABNORMAL LOW (ref 150–400)
RBC: 2.8 MIL/uL — ABNORMAL LOW (ref 3.87–5.11)
RDW: 15.3 % (ref 11.5–15.5)
WBC: 6.6 10*3/uL (ref 4.0–10.5)
nRBC: 0 % (ref 0.0–0.2)

## 2023-08-22 LAB — RENAL FUNCTION PANEL
Albumin: 4.1 g/dL (ref 3.5–5.0)
Anion gap: 13 (ref 5–15)
BUN: 54 mg/dL — ABNORMAL HIGH (ref 6–20)
CO2: 20 mmol/L — ABNORMAL LOW (ref 22–32)
Calcium: 8.7 mg/dL — ABNORMAL LOW (ref 8.9–10.3)
Chloride: 99 mmol/L (ref 98–111)
Creatinine, Ser: 3.06 mg/dL — ABNORMAL HIGH (ref 0.44–1.00)
GFR, Estimated: 20 mL/min — ABNORMAL LOW (ref >60)
Glucose, Bld: 166 mg/dL — ABNORMAL HIGH (ref 70–99)
Phosphorus: 6.2 mg/dL — ABNORMAL HIGH (ref 2.5–4.6)
Potassium: 4.3 mmol/L (ref 3.5–5.1)
Sodium: 132 mmol/L — ABNORMAL LOW (ref 135–145)

## 2023-08-22 LAB — GLUCOSE, CAPILLARY
Glucose-Capillary: 112 mg/dL — ABNORMAL HIGH (ref 70–99)
Glucose-Capillary: 147 mg/dL — ABNORMAL HIGH (ref 70–99)
Glucose-Capillary: 148 mg/dL — ABNORMAL HIGH (ref 70–99)
Glucose-Capillary: 163 mg/dL — ABNORMAL HIGH (ref 70–99)
Glucose-Capillary: 181 mg/dL — ABNORMAL HIGH (ref 70–99)
Glucose-Capillary: 182 mg/dL — ABNORMAL HIGH (ref 70–99)

## 2023-08-22 MED ORDER — POLYETHYLENE GLYCOL 3350 17 G PO PACK
17.0000 g | PACK | Freq: Two times a day (BID) | ORAL | Status: DC
  Administered 2023-08-22 (×2): 17 g via ORAL
  Filled 2023-08-22 (×4): qty 1

## 2023-08-22 MED ORDER — FUROSEMIDE 10 MG/ML IJ SOLN
100.0000 mg | Freq: Three times a day (TID) | INTRAVENOUS | Status: DC
  Administered 2023-08-22 – 2023-08-24 (×7): 100 mg via INTRAVENOUS
  Filled 2023-08-22 (×8): qty 10

## 2023-08-22 MED ORDER — SENNOSIDES-DOCUSATE SODIUM 8.6-50 MG PO TABS
1.0000 | ORAL_TABLET | Freq: Two times a day (BID) | ORAL | Status: DC
  Administered 2023-08-22 (×2): 1 via ORAL
  Filled 2023-08-22 (×4): qty 1

## 2023-08-22 NOTE — Progress Notes (Signed)
 Patient Name: Jennifer [name] Date of Encounter: 08/22/2023 [facility] Cardiologist: Knox Perl, MD   Interval Summary  .    Less dyspnea and abdominal pain.   Vital Signs .    Vitals:   08/22/23 0420 08/22/23 0500 08/22/23 0600 08/22/23 0800  BP:  (!) 153/86 (!) 119/41   Pulse:  75 73   Resp:  16 15   Temp: 97.7 F (36.5 C)   97.9 F (36.6 C)  TempSrc: Oral   Oral  SpO2:  100% 100%   Weight:  78.5 kg    Height:        Intake/Output Summary (Last 24 hours) at 08/22/2023 0818 Last data filed at 08/22/2023 0317 Gross per 24 hour  Intake 406.7 ml  Output 1050 ml  Net -643.3 ml      08/22/2023    5:00 AM 08/21/2023    5:00 AM 08/20/2023    1:06 AM  Last 3 Weights  Weight (lbs) 173 lb 1 oz 167 lb 8.8 oz 166 lb 10.7 oz  Weight (kg) 78.5 kg 76 kg 75.6 kg      Telemetry/ECG    Sinus rhythm heart rates in the 80s- Personally Reviewed  CV Studies    Echocardiogram 08/16/2023 1. Left ventricular ejection fraction, by estimation, is 45 to 50%. The  left ventricle has mildly decreased function. The left ventricle  demonstrates global hypokinesis. Left ventricular diastolic function could  not be evaluated.   2. Right ventricular systolic function is normal. The right ventricular  size is normal. There is normal pulmonary artery systolic pressure.   3. The mitral valve is normal in structure. Trivial mitral valve  regurgitation. No evidence of mitral stenosis.   4. The aortic valve is tricuspid. Aortic valve regurgitation is not  visualized. No aortic stenosis is present.   5. The inferior vena cava is normal in size with <50% respiratory  variability, suggesting right atrial pressure of 8 mmHg.   Physical Exam .    No distress Right eye edema/ptosis from astrocytoma surgery Lungs clear Abdomen benign No edema No murmur   Patient Profile    Jennifer [name] is a 33 y.o. female has hx of  CAD with RCA occlusion cath 2022, nonischemic cardiomyopathy,  cardio mems implant 2023, pulmonary hypertension, TR astrocytoma status postresection in 1997 with residual left-sided weakness, CKD stage IV, severe hypertriglyceridemia,  chronic pancreatitis, NASH, type 2 diabetes, hypertension, hypothyroidism, PCOS, OSA awaiting CPAP.  Cardiology initially asked to see due to abnormal echocardiogram.  Admitted for acute on chronic hyper triglyceridemia complaints of abdominal pain.    Assessment & Plan .     Nonischemic cardiomyopathy with mildly reduced EF -Previous EF 25 to 30% in 2022 -EF 50 to 55%, normal RV July 2024, most recent. Cardiology initially consulted for abnormal echocardiogram, results show EF 45 to 50%, global hypokinesis, normal RV.  Normal PASP.  No significant valvular disease.  EF seems to be stable for the past couple years.   GDMT: Coreg  9.375 mg twice daily, ivabradine  7.5 mg twice daily, hydralazine  held for orthostatic will resume at lower dose  Did not tolerate spironolactone  due to hyperkalemia, no Jardiance with frequent GU infections.  CKD limits further titration. She has Cardio[facility] Nephrology consulted due to AKI, initially given IV fluids for pancreatitis.  Fluid balance is a fine line with her pancreatitis and systolic heart failure.  She was subsequently started on IV Lasix  but had worsening hyponatremia.  Nephrology started hypertonic saline.  CAD Cardiac catheterization 2022 demonstrating single-vessel RCA occlusion.  No chest pain. Continue with aspirin , Coreg , no statin elevated TG   Hypertension Previously poorly controlled but reasonable this admission.  Previous concerns of noncompliance but reports compliance with all of her patients now. Ok to hold norvasc  resume low dose hydralazine     Severe hypertriglyceridemia Acute on chronic pancreatitis She follows with Dr. Maximo Spar, TG is downtrending.  Further management per primary team.   Type IIIc diabetes On insulin    AKI on CKD  Creatinine elevated 3.06    For questions or updates, please contact [facility] Please consult [email or web address] for contact info under    Signed, Janelle Mediate, MD

## 2023-08-22 NOTE — Progress Notes (Signed)
   08/22/23 2012  BiPAP/CPAP/SIPAP  Reason BIPAP/CPAP not in use Non-compliant  BiPAP/CPAP /SiPAP Vitals  Pulse Rate 78  Resp 16  SpO2 100 %  MEWS Score/Color  MEWS Score 0  MEWS Score Color Green   Patient refused the use of cpap again tonight. She states she would not like to wear and doesn't wear. [name] stable using the supplemental oxygen

## 2023-08-22 NOTE — Progress Notes (Signed)
 [facility] Progress Note  Subjective:  UOP yest 950  UOP otday 1500 so far on higher dose IV lasix  Creat stable w/ diuresis  Na+ up to 132 today from 128  Vitals:   08/22/23 0600 08/22/23 0800 08/22/23 0900 08/22/23 1200  BP: (!) 119/41 (!) 147/64 137/71   Pulse: 73 78 80   Resp: 15 16 14    Temp:  97.9 F (36.6 C)  98 F (36.7 C)  TempSrc:  Oral  Oral  SpO2: 100% 99% 100%   Weight:      Height:        Exam: General:  adult female, lethargic, difficult to understand Eyes: periorbital edema  Heart: S1S2 no rub Lungs: clear to auscultation  Abdomen: distended, almost tight Extremities: diffuse 2+ UE and LE edema Neuro: lethargic, Ox 1.5  GU no foley    Renal-related home meds: Norvasc  5 every day Rocaltrol 0.25 mcg daily Coreg  9.375 mg bid Hydralazine  75 mg tid Sod bicarb 1300 bid Demadex  60mg  po daily Others: Creon , Topamax , Imitrex , Carafate , Crestor , Repatha , Compazine , Lyrica , PPI, MVI, Reglan , Synthroid , Corlanor , insulin , fenofibrate , Cymbalta , Fioricet , aspirin , Elavil   Date   Creat  eGFR (ml/min) 2019- 2020  0.66- 0.81 2021   1.10- 1.24 2022   0.95- 3.91 2023   1.06- 3.40 2024   1.04- 3.13 Jan- feb 2025  1.70- 2.92 21- 40 ml/min    Mar 2025  1.63- 1.78 April 2025  1.74- 2.34 28- 40 ml/min  08/07/23  2.18- 2.40 08/14/23  1.94 5/20   2.26 5/21   2.35 5/22   2.00 5/23   3.25 5/24   3.29 5/25   3.32  18 ml/min  08/21/23  2.95  21 ml/min   Summary: Jennifer [name] is a 33 y.o. female with a history of astrocytoma, atrial fibrillation, chronic systolic congestive heart failure with recovered EF, NASH, and CKD stage IV who presented to the hospital with abdominal pain and nausea. She was found to have hypertriglyceridemia felt induced by pancreatitis.    Assessment/ Plan: AKI on CKD 3b: b/l creat 1.7- 2.3 from April 2025, eGFR 28-40 ml/min. Creat here was 1.9 on admission in the setting of pancreatitis with assoc hypertriglyceridemia. Creat  then rose up to 3.3 yest and down to 2.95 today. I/O are + 4 L. CXR 5/26 showed no edema. On exam is quite edematous x 4 extremities and wt's are up 6kg. No nephrotoxins, did get IV lasix  from 5/24 to 5/25. BP's are soft today. Seemed to be responding to IV lasix  w/ small drop in creatinine. Still looked quite swollen on day of initial consult. Coreg  dc'd and IV lasix  started 5/26 at 80mg  IV q 12hrs. UOP response was not great so have ^'d IV lasix  to 100mg  tid. Creat stable.   Hyponatremia: SP 3% saline (Na+ 120 -> 128 in 24 hrs, dc'd 5/26). Cont IV lasix  for treatment of hypervolemic hyponatremia. Improving today to 132.  HTN: as above, bp's were soft 5/26 so coreg  was dc'd and midodrine 5 tid was initiated. Today BP's much better and midodrine has been dc'd.    Hypervolemia: from exam, ^ in wt's and + I/O's.  Acute on chronic pancreatitis: w/ severe ^TG. Per pmd. DM type 2     Larry Poag MD  [facility] 08/22/2023, 3:01 PM  Recent Labs  Lab 08/19/23 0744 08/20/23 0551 08/20/23 1355 08/21/23 0449 08/21/23 0619 08/22/23 0330  HGB 9.7* 9.0*  --  9.1*  --  8.7*  ALBUMIN  3.7  3.6  --   --   --  4.1  CALCIUM  8.8* 8.3*   < >  --  8.0* 8.7*  PHOS 4.5  --   --   --   --  6.2*  CREATININE 3.29* 3.32*   < >  --  2.95* 3.06*  K 3.8 3.8   < >  --  4.1 4.3   < > = values in this interval not displayed.   No results for input(s): "IRON", "TIBC", "FERRITIN" in the last 168 hours. Inpatient medications:  aspirin  EC  81 mg Oral Daily   azelastine   2 spray Each Nare Q0600   calcitRIOL  0.25 mcg Oral Daily   Chlorhexidine  Gluconate Cloth  6 each Topical QHS   enoxaparin  (LOVENOX ) injection  30 mg Subcutaneous Q24H   famotidine   10 mg Oral Daily   fenofibrate   54 mg Oral Daily   insulin  aspart  0-9 Units Subcutaneous TID WC   insulin  glargine-yfgn  50 Units Subcutaneous BID   ivabradine   5 mg Oral BID WC   And   ivabradine   2.5 mg Oral BID WC   levothyroxine   150 mcg Oral Q0600    lipase/protease/amylase  36,000 Units Oral TID AC   loratadine   10 mg Oral QPM   metoCLOPramide   5 mg Oral TID AC & HS   pantoprazole   40 mg Oral Daily   polyethylene glycol  17 g Oral BID   pregabalin   150 mg Oral Daily   rosuvastatin   40 mg Oral Daily   senna-docusate  1 tablet Oral BID   sodium bicarbonate   650 mg Oral BID   Vitamin D  (Ergocalciferol )  50,000 Units Oral Q7 days    furosemide  100 mg (08/22/23 1321)   acetaminophen  **OR** acetaminophen , butalbital -acetaminophen -caffeine , camphor-menthol, diphenhydrAMINE , HYDROmorphone  (DILAUDID ) injection, ipratropium-albuterol , midodrine, mouth rinse, polyethylene glycol, prochlorperazine 

## 2023-08-22 NOTE — Progress Notes (Signed)
 PROGRESS NOTE    Jennifer [name]  WUJ:[id number] DOB: 04/02/1990 DOA: 08/15/2023 PCP: Calton Catholic, PA-C    Chief Complaint  Patient presents with   Abdominal Pain    Brief Narrative:  Patient is a 32 year old female history of paroxysmal A-fib, astrocytoma, chronic systolic CHF with recovered EF, history of prolonged QT interval, lipoprotein deficiency, familial hypercholesterolemia, cholesterolosis, NASH, stage IV CKD, class I obesity, type 2 diabetes, hypertension hypothyroidism, recurrent pancreatitis, gastroparesis, peripheral neuropathy, history of pancytopenia, GERD, PCOS presented to the ED with severe abdominal pain nausea dry heaves.  Patient stated abdominal pain occurred when she woke up and took a small sip of juice which triggered her pain immediately.  Patient also noted with some loose stools.  Patient seen in the ED, CBC, lipase, serum pregnancy test unremarkable.  CMP with a BUN of 47, creatinine of 2.26, AST of 87, ALT of 117.  Patient noted to have a significantly elevated triglyceride level of 2288 patient admitted to stepdown unit, placed on the insulin  drip due to concerns for possible pancreatitis induced hypertriglyceridemia.  Hospitalization complicated by hyponatremia, acute kidney injury and as such nephrology consulted.   Assessment & Plan:   Principal Problem:   Intractable abdominal pain Active Problems:   Familial hypertriglyceridemia   AKI (acute kidney injury) (HCC)   Type 2 diabetes mellitus with hyperlipidemia (HCC)   Hypothyroidism   Chronic systolic CHF (congestive heart failure) (HCC)   HTN (hypertension)   Acute systolic congestive heart failure (HCC)   CKD (chronic kidney disease) stage 4, GFR 15-29 ml/min (HCC)   Pulmonary hypertension (HCC)   NASH (nonalcoholic steatohepatitis)   Obesity (BMI 30-39.9)   Hyponatremia   Acute on chronic pancreatitis (HCC)   Gastroparesis due to DM (HCC)   Hepatic steatosis   Coronary artery disease  involving native coronary artery of native heart without angina pectoris   Nocturnal hypoxemia   Chronic pancreatitis (HCC)   SOB (shortness of breath)   Dizziness  #1 familial hypertriglyceridemia/acute on chronic pancreatitis secondary to hypertriglyceridemia - Patient noted to have presented with sudden onset epigastric abdominal pain nausea, dry heaves, patient with a history of chronic pancreatitis. - Triglyceride level noted at 2288 on admission which is trending down currently at 635 from 569 from 648 from 716 from 896 from 1258 from 1256 from 1412 from 1534 from 1761 from 2288 on admission. - Lipase level noted at 26. - LFTs noted to be elevated but trending down, patient with history of NASH. - Right upper quadrant ultrasound done with small volume ascites without pocket of fluid large enough to allow for safe approach for paracentesis. - Patient with clinical improvement with no further nausea or vomiting.   - Due to complaints of abdominal pain diet was downgraded to a clear liquid diet, and has subsequently advance back to a carb modified diet which patient is tolerating.  - IV insulin  drip rate was increased on 08/17/2023 however due to dropping CBGs was decreased back on 08/17/2023.  CBGs have improved.   - Increased insulin  drip rate back up on 08/18/2023. -Triglycerides levels improved and patient noted to be hyponatremic and as such transitioned to subcutaneous Semglee  50 units twice daily. -IV fluids discontinued. -Continue fenofibrate , Crestor . - Continue CBGs to [facility] and at bedtime. -Supportive care. - Outpatient follow-up with primary endocrinologist.  2.  Gastroparesis due to diabetes mellitus - Continue Reglan  5 mg AC and at bedtime.   3.  NASH/hepatic steatosis -Outpatient follow-up with gastroenterology.  4.  Diabetes mellitus type 2 with hyperglycemia with long-term insulin  use -Hemoglobin A1c 6.1 (05/06/2023) - CBG noted at 163 this morning. - Patient was on  insulin  drip secondary to problem #1. -Insulin  drip dose adjusted on 08/17/2023 with blood glucose levels going down to 45 and as such insulin  drip dose decreased. -CBGs improved and insulin  drip increased on 08/18/2023.   - Diet also advanced to a regular diet due to low CBGs early on during the hospitalization and downgraded to clear liquid diet on 08/19/2023 due to patient's complaints of epigastric abdominal pain. -Diet has been advanced to carb modified diet on 08/21/2023 which patient is currently tolerating.. -Patient transitioned off insulin  drip currently on Semglee  50 units twice daily, SSI. -??  Medication compliance prior to admission as patient on high doses of U-500 and blood glucose levels seem to be controlled on current inpatient regimen. -Continue Reglan . -Continue Lyrica  at a decreased dose of 150 mg daily due to renal function. - Continue CBGs AC and at bedtime.  5.  Hypertension - Antihypertensives discontinued on 08/21/2023 due to orthostasis and dizziness and allow room for diuresis.   - Will defer resumption of antihypertensive medications to nephrology and cardiology.   6.  Pulmonary hypertension - Continue home medications.  7.  Acute renal failure on CKD stage IV/metabolic acidosis -Baseline creatinine 1.9-2.5. -Patient noted with worsening renal function with creatinine currently at 3.06 from 2.95 from 3.19 from 3.15 from 3.32 from 3.29 from 3.25 from 2.0 over the past 24 hours. -BP noted to be soft on 08/18/2023, BP has improved this morning with systolic in the 120s.  -Concern for prerenal azotemia initially. - Renal ultrasound negative for hydronephrosis.  - Urinalysis nitrite negative, leukocytes negative, negative for protein.  Urine sodium noted at 15, urine creatinine of 108.  - Patient denies any further epigastric abdominal pain.   - Patient complained of feeling bloated to nephrology and started on IV Lasix .   - Urine output recorded 1.050 L over the past 24  hours. -D5 normal saline discontinued. -Patient placed on 3% saline nebs due to hyponatremia which has subsequently been discontinued. - Patient given IV Lasix  per nephrology on 08/21/2023.. - Continue home regimen of bicarb tablets, calcitriol . -Per nephrology.  8.  Hyponatremia -??  Etiology. -Patient with worsening hyponatremia on 08/20/2023.. - Concern for hypovolemic hyponatremia versus due to increased free water  as patient noted to have been on D10 infusion. -D10 infusion was changed to half-normal saline which was subsequently changed to D5 normal saline which has been discontinued.  -Daily weights. - Urine osmolality pending  - Urine sodium < 10, urine creatinine 80. - Serum osmolality of 290. - TSH at 6.758. - Patient states thyroid  dose recently adjusted. -Elavil  and Cymbalta  held by nephrology. -Due to worsening hyponatremia with sodium as low as 120, patient seen by nephrology and started on 3% saline at 30 cc/h with serial sodium levels. -3% saline discontinued. -Patient placed on IV Lasix  on 08/21/2023.  Nephrology. -Sodium at 132 this morning. -Per nephrology.  9.  CAD/acute on chronic systolic CHF - Continue home cardiac regimen of aspirin , amlodipine , carvedilol , Demadex . - Fenofibrate  and statin resumed on 08/20/2023.  - 2D echo obtained with a EF of 45 to 50%, left ventricular global hypokinesis. - Cardiology consulted due to abnormal 2D echo for further evaluation and recommendations.  - Patient seen in consultation by cardiology and recommend continuation of cardiac medications/heart failure medications as noted in the note.  Patient noted not to tolerate spironolactone   due to hyperkalemia, no Jardiance with frequent GU infections and CKD limits further titration. -Patient with some complaints of shortness of breath on 08/20/2023 which has since improved after receiving IV Lasix . - Chest x-ray negative for any acute infiltrates. - BNP noted at 278.7.  -Patient  initially was on IV Lasix  which have been held per nephrology. -Patient placed on IV Lasix  on 08/21/2023 per nephrology with urine output of 1.050 L over the past 24 hours. -Will defer diuretics and antihypertensive medications to nephrology and cardiology. - Appreciate cardiology input and recommendations.  9. nocturnal hypoxemia - Continue O2 at bedtime.   10.  Obesity -BMI 32.24 kg/m. - Lifestyle modification. - Outpatient follow-up with PCP.  11.  Hypothyroidism - TSH 6.758.   - Patient states Synthroid  dose was adjusted 1 week prior to admission. -Continue current Synthroid  dose. -Outpatient follow-up with primary endocrinologist.  12.  Dizziness/orthostatic hypotension -Patient with some complaints of dizziness on 08/20/2023. - Orthostatics checked on 08/20/2023 were negative.  - Per RN patient working with PT the morning of 08/21/2023, noted to have some complaints of dizziness and orthostatic with supine blood pressure 119/69>>> sitting 104/53>>> standing 62/33.  - Antihypertensive medications on hold.   - Patient placed on midodrine 5 mg 3 times daily x 1 day on 08/21/2023.   -Status post IV albumin . - Repeat orthostatics this morning.  - Continue midodrine as needed as ordered by nephrology.  13.  Shortness of breath -Some complaints of shortness of breath on 08/20/2023, with some improvement today after being placed on IV Lasix  yesterday per nephrology. - Urine output of 1 L. - Per RN I and O cath overnight with 600 cc. - Chest x-ray done unremarkable. - On IV Lasix  per nephrology.  - Cardiology following.   DVT prophylaxis: Lovenox  Code Status: Full Family Communication: Updated patient.  No family at bedside. Disposition: Transfer to progressive care.  SNF when medically stable.    Status is: Inpatient The patient will require care spanning > 2 midnights and should be moved to inpatient because: Severity of illness   Consultants:  Cardiology: Dr.Schumann  08/17/2023 Nephrology: Dr. Yvonnie Heritage 08/18/2023  Procedures:  Right upper quadrant ultrasound 08/15/2023 2D echo 08/16/2023 Renal ultrasound 08/18/2023   Antimicrobials: Anti-infectives (From admission, onward)    None         Subjective: Patient laying in bed eating breakfast.  States improvement with abdominal pain.  States some improvement with shortness of breath.  States having bowel movements.  Denies any chest pain.  Per RN patient required I and O cath overnight with about 600 cc of urine output.  Patient denies any dizziness.  Objective: Vitals:   08/22/23 0500 08/22/23 0600 08/22/23 0800 08/22/23 0900  BP: (!) 153/86 (!) 119/41 (!) 147/64 137/71  Pulse: 75 73 78 80  Resp: 16 15 16 14   Temp:   97.9 F (36.6 C)   TempSrc:   Oral   SpO2: 100% 100% 99% 100%  Weight: 78.5 kg     Height:        Intake/Output Summary (Last 24 hours) at 08/22/2023 0945 Last data filed at 08/22/2023 1610 Gross per 24 hour  Intake 406.7 ml  Output 1450 ml  Net -1043.3 ml   Filed Weights   08/20/23 0106 08/21/23 0500 08/22/23 0500  Weight: 75.6 kg 76 kg 78.5 kg    Examination:  General exam: NAD. Respiratory system: CTAB.  Some decreased breath sounds in the bases.  No wheezes, no crackles.  Fair  air movement.  Speaking in full sentences.  Cardiovascular system: Regular rate rhythm no murmurs rubs or gallops.  No JVD.  1+ bilateral lower extremity edema. Gastrointestinal system: Abdomen is soft, mildly distended, nontender to palpation in epigastrium.  Positive bowel sounds.  No rebound.  No guarding.  Central nervous system: Alert and oriented. No focal neurological deficits. Extremities: Symmetric 5 x 5 power. Skin: No rashes, lesions or ulcers Psychiatry: Judgement and insight appear normal. Mood & affect appropriate.     Data Reviewed: I have personally reviewed following labs and imaging studies  CBC: Recent Labs  Lab 08/17/23 0327 08/18/23 0322 08/19/23 0744  08/20/23 0551 08/21/23 0449 08/22/23 0330  WBC 7.1 6.9 7.5 7.7 7.0 6.6  NEUTROABS 3.5  --   --   --   --   --   HGB 10.2* 10.4* 9.7* 9.0* 9.1* 8.7*  HCT 33.1* 33.0* 30.5* 27.6* 28.6* 27.7*  MCV 95.1 97.6 96.5 94.5 96.3 98.9  PLT 124* 112* 99* 96* 104* 102*    Basic Metabolic Panel: Recent Labs  Lab 08/17/23 0327 08/18/23 0322 08/19/23 0744 08/20/23 0551 08/20/23 1355 08/20/23 1644 08/20/23 2147 08/21/23 0044 08/21/23 0449 08/21/23 0619 08/21/23 0859 08/21/23 1248 08/22/23 0330  NA 137 131* 128* 121* 120*   < > 123*   < > 126* 125* 127* 128* 132*  K 3.8 3.8 3.8 3.8 4.2  --  4.2  --   --  4.1  --   --  4.3  CL 104 97* 97* 91* 89*  --  93*  --   --  95*  --   --  99  CO2 22 22 19* 19* 19*  --  19*  --   --  15*  --   --  20*  GLUCOSE 88 272* 78 81 172*  --  209*  --   --  182*  --   --  166*  BUN 37* 45* 48* 46* 43*  --  45*  --   --  46*  --   --  54*  CREATININE 2.00* 3.25* 3.29* 3.32* 3.15*  --  3.19*  --   --  2.95*  --   --  3.06*  CALCIUM  8.9 8.8* 8.8* 8.3* 8.2*  --  8.0*  --   --  8.0*  --   --  8.7*  MG 2.2 2.2  --   --   --   --   --   --   --   --   --   --   --   PHOS  --   --  4.5  --   --   --   --   --   --   --   --   --  6.2*   < > = values in this interval not displayed.    GFR: Estimated Creatinine Clearance: 22.8 mL/min (A) (by C-G formula based on SCr of 3.06 mg/dL (H)).  Liver Function Tests: Recent Labs  Lab 08/16/23 0340 08/17/23 0327 08/18/23 0322 08/19/23 0744 08/20/23 0551 08/22/23 0330  AST 85* 83* 56*  --  81*  --   ALT 98* 94* 81*  --  85*  --   ALKPHOS 84 84 89  --  77  --   BILITOT 0.7 0.8 0.5  --  0.8  --   PROT 6.9 7.1 7.1  --  7.0  --   ALBUMIN  3.8 3.9 3.7 3.7 3.6 4.1  CBG: Recent Labs  Lab 08/21/23 1943 08/21/23 2314 08/22/23 0056 08/22/23 0341 08/22/23 0800  GLUCAP 187* 218* 182* 163* 148*     Recent Results (from the past 240 hours)  MRSA Next Gen by PCR, Nasal     Status: None   Collection Time: 08/15/23   5:21 PM   Specimen: Nasal Mucosa; Nasal Swab  Result Value Ref Range Status   MRSA by PCR Next Gen NOT DETECTED NOT DETECTED Final    Comment: (NOTE) The GeneXpert MRSA Assay (FDA approved for NASAL specimens only), is one component of a comprehensive MRSA colonization surveillance program. It is not intended to diagnose MRSA infection nor to guide or monitor treatment for MRSA infections. Test performance is not FDA approved in patients less than 64 years old. Performed at [facility], [address] [place], [place] [address]          Radiology Studies: DG CHEST PORT 1 VIEW Result Date: 08/20/2023 CLINICAL DATA:  [id number] SOB (shortness of breath) [id number] EXAM: PORTABLE CHEST 1 VIEW COMPARISON:  July 02, 2023 FINDINGS: The cardiomediastinal silhouette is unchanged and enlarged in contour.CardioMEMS. No pleural effusion. No pneumothorax. No acute pleuroparenchymal abnormality. IMPRESSION: No acute cardiopulmonary abnormality. Electronically Signed   By: Clancy Crimes M.D.   On: 08/20/2023 11:55         Scheduled Meds:  aspirin  EC  81 mg Oral Daily   azelastine   2 spray Each Nare Q0600   calcitRIOL  0.25 mcg Oral Daily   Chlorhexidine  Gluconate Cloth  6 each Topical QHS   enoxaparin  (LOVENOX ) injection  30 mg Subcutaneous Q24H   famotidine   10 mg Oral Daily   fenofibrate   54 mg Oral Daily   insulin  aspart  0-9 Units Subcutaneous TID WC   insulin  glargine-yfgn  50 Units Subcutaneous BID   ivabradine   5 mg Oral BID WC   And   ivabradine   2.5 mg Oral BID WC   levothyroxine   150 mcg Oral Q0600   lipase/protease/amylase  36,000 Units Oral TID AC   loratadine   10 mg Oral QPM   metoCLOPramide   5 mg Oral TID AC & HS   midodrine  5 mg Oral TID WC   pantoprazole   40 mg Oral Daily   pregabalin   150 mg Oral Daily   rosuvastatin   40 mg Oral Daily   sodium bicarbonate   650 mg Oral BID   Vitamin D  (Ergocalciferol )  50,000 Units Oral Q7 days   Continuous  Infusions:  furosemide        LOS: 6 days    Time spent: 40 minutes    Hilda Lovings, MD [facility]   To contact the attending provider between 7A-7P or the covering provider during after hours 7P-7A, please log into the web site [email or web address] and access using universal Amanda password for that web site. If you do not have the password, please call the hospital operator.  08/22/2023, 9:45 AM

## 2023-08-22 NOTE — Plan of Care (Signed)
  Problem: Clinical Measurements: Goal: Respiratory complications will improve Outcome: Progressing   Problem: Nutrition: Goal: Adequate nutrition will be maintained Outcome: Progressing   Problem: Coping: Goal: Level of anxiety will decrease Outcome: Progressing   

## 2023-08-23 DIAGNOSIS — N189 Chronic kidney disease, unspecified: Secondary | ICD-10-CM | POA: Diagnosis not present

## 2023-08-23 DIAGNOSIS — E038 Other specified hypothyroidism: Secondary | ICD-10-CM

## 2023-08-23 DIAGNOSIS — I5023 Acute on chronic systolic (congestive) heart failure: Secondary | ICD-10-CM | POA: Diagnosis not present

## 2023-08-23 DIAGNOSIS — I255 Ischemic cardiomyopathy: Secondary | ICD-10-CM | POA: Diagnosis not present

## 2023-08-23 DIAGNOSIS — K859 Acute pancreatitis without necrosis or infection, unspecified: Secondary | ICD-10-CM | POA: Diagnosis not present

## 2023-08-23 DIAGNOSIS — I10 Essential (primary) hypertension: Secondary | ICD-10-CM | POA: Diagnosis not present

## 2023-08-23 DIAGNOSIS — E781 Pure hyperglyceridemia: Secondary | ICD-10-CM | POA: Diagnosis not present

## 2023-08-23 DIAGNOSIS — N1832 Chronic kidney disease, stage 3b: Secondary | ICD-10-CM

## 2023-08-23 DIAGNOSIS — N179 Acute kidney failure, unspecified: Secondary | ICD-10-CM | POA: Diagnosis not present

## 2023-08-23 LAB — CBC
HCT: 28.1 % — ABNORMAL LOW (ref 36.0–46.0)
Hemoglobin: 8.6 g/dL — ABNORMAL LOW (ref 12.0–15.0)
MCH: 30.5 pg (ref 26.0–34.0)
MCHC: 30.6 g/dL (ref 30.0–36.0)
MCV: 99.6 fL (ref 80.0–100.0)
Platelets: 126 10*3/uL — ABNORMAL LOW (ref 150–400)
RBC: 2.82 MIL/uL — ABNORMAL LOW (ref 3.87–5.11)
RDW: 15.4 % (ref 11.5–15.5)
WBC: 7 10*3/uL (ref 4.0–10.5)
nRBC: 0 % (ref 0.0–0.2)

## 2023-08-23 LAB — GLUCOSE, CAPILLARY
Glucose-Capillary: 96 mg/dL (ref 70–99)
Glucose-Capillary: 56 mg/dL — ABNORMAL LOW (ref 70–99)
Glucose-Capillary: 60 mg/dL — ABNORMAL LOW (ref 70–99)
Glucose-Capillary: 173 mg/dL — ABNORMAL HIGH (ref 70–99)
Glucose-Capillary: 58 mg/dL — ABNORMAL LOW (ref 70–99)
Glucose-Capillary: 150 mg/dL — ABNORMAL HIGH (ref 70–99)
Glucose-Capillary: 97 mg/dL (ref 70–99)
Glucose-Capillary: 128 mg/dL — ABNORMAL HIGH (ref 70–99)

## 2023-08-23 LAB — RENAL FUNCTION PANEL
Albumin: 4 g/dL (ref 3.5–5.0)
Anion gap: 12 (ref 5–15)
BUN: 57 mg/dL — ABNORMAL HIGH (ref 6–20)
CO2: 24 mmol/L (ref 22–32)
Calcium: 9.1 mg/dL (ref 8.9–10.3)
Chloride: 99 mmol/L (ref 98–111)
Creatinine, Ser: 3.01 mg/dL — ABNORMAL HIGH (ref 0.44–1.00)
GFR, Estimated: 20 mL/min — ABNORMAL LOW (ref >60)
Glucose, Bld: 84 mg/dL (ref 70–99)
Phosphorus: 5.7 mg/dL — ABNORMAL HIGH (ref 2.5–4.6)
Potassium: 3.9 mmol/L (ref 3.5–5.1)
Sodium: 135 mmol/L (ref 135–145)

## 2023-08-23 LAB — GLUCOSE, RANDOM: Glucose, Bld: 67 mg/dL — ABNORMAL LOW (ref 70–99)

## 2023-08-23 MED ORDER — DEXTROSE 50 % IV SOLN: 12.5000 g | Freq: Once | INTRAVENOUS | Status: AC

## 2023-08-23 MED ORDER — INSULIN GLARGINE-YFGN 100 UNIT/ML ~~LOC~~ SOLN
40.0000 [IU] | Freq: Two times a day (BID) | SUBCUTANEOUS | Status: DC
  Administered 2023-08-23 – 2023-08-26 (×6): 40 [IU] via SUBCUTANEOUS
  Filled 2023-08-23 (×7): qty 0.4

## 2023-08-23 MED ORDER — DEXTROSE 50 % IV SOLN
INTRAVENOUS | Status: AC
  Administered 2023-08-23: 12.5 g via INTRAVENOUS
  Filled 2023-08-23: qty 50

## 2023-08-23 NOTE — Assessment & Plan Note (Signed)
 Continue amlodipine , Coreg  Blood pressures are essentially at target

## 2023-08-23 NOTE — Plan of Care (Signed)

## 2023-08-23 NOTE — Assessment & Plan Note (Signed)
 Continue scheduled metoclopramide 

## 2023-08-23 NOTE — Progress Notes (Signed)
 Patient Name: Mileigh Tilley Date of Encounter: 08/23/2023 [facility] Cardiologist: Knox Perl, MD   Interval Summary  .    Less dyspnea and abdominal pain. Has walked in halls   Vital Signs .    Vitals:   08/22/23 2021 08/22/23 2242 08/23/23 0457 08/23/23 0916  BP: (!) 143/75 125/69 134/83 (!) 128/54  Pulse: 78 80 82 86  Resp: 15 20 18 19   Temp: 98.7 F (37.1 C) 98.3 F (36.8 C) 98.3 F (36.8 C) 98.5 F (36.9 C)  TempSrc: Oral Oral Oral Oral  SpO2: 99% 100% 100% 100%  Weight:      Height:        Intake/Output Summary (Last 24 hours) at 08/23/2023 0951 Last data filed at 08/23/2023 0900 Gross per 24 hour  Intake 958.98 ml  Output 1880 ml  Net -921.02 ml      08/22/2023    5:00 AM 08/21/2023    5:00 AM 08/20/2023    1:06 AM  Last 3 Weights  Weight (lbs) 173 lb 1 oz 167 lb 8.8 oz 166 lb 10.7 oz  Weight (kg) 78.5 kg 76 kg 75.6 kg      Telemetry/ECG    Sinus rhythm heart rates in the 80s- Personally Reviewed  CV Studies    Echocardiogram 08/16/2023 1. Left ventricular ejection fraction, by estimation, is 45 to 50%. The  left ventricle has mildly decreased function. The left ventricle  demonstrates global hypokinesis. Left ventricular diastolic function could  not be evaluated.   2. Right ventricular systolic function is normal. The right ventricular  size is normal. There is normal pulmonary artery systolic pressure.   3. The mitral valve is normal in structure. Trivial mitral valve  regurgitation. No evidence of mitral stenosis.   4. The aortic valve is tricuspid. Aortic valve regurgitation is not  visualized. No aortic stenosis is present.   5. The inferior vena cava is normal in size with <50% respiratory  variability, suggesting right atrial pressure of 8 mmHg.   Physical Exam .    No distress Right eye edema/ptosis from astrocytoma surgery Lungs clear Abdomen benign Trace edema No murmur   Patient Profile    Rosalene Wardrop is a  33 y.o. 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  Cardiology initially asked to see due to abnormal echocardiogram.  Admitted for acute on chronic hyper triglyceridemia complaints of abdominal pain.    Assessment & Plan .     Nonischemic cardiomyopathy with mildly reduced EF -Previous EF 25 to 30% in 2022 -EF 50 to 55%, normal RV July 2024, most recent. Cardiology initially consulted for abnormal echocardiogram, results show EF 45 to 50%, global hypokinesis, normal RV.  Normal PASP.  No significant valvular disease.  EF seems to be stable for the past couple years.   GDMT: Coreg  9.375 mg twice daily, ivabradine  7.5 mg twice daily, hydralazine  held for orthostatic on midodrine  Did not tolerate spironolactone  due to hyperkalemia, no Jardiance with frequent GU infections.  CKD limits further titration. She has CardioMEMS Nephrology consulted due to AKI, initially given IV fluids for pancreatitis.  Fluid balance is a fine line with her pancreatitis and systolic heart failure.  She was subsequently started on IV Lasix  but had worsening hyponatremia.  Nephrology started hypertonic saline.  Can consider restarting bidil /hyralazine as outpatient    CAD Cardiac catheterization 2022 demonstrating single-vessel RCA occlusion.  No chest pain. Continue with aspirin , Coreg , no statin elevated TG   Hypertension Previously poorly controlled but reasonable this admission.  Previous concerns of noncompliance but reports compliance with all of her patients now. Ok to hold norvasc  BP normal this am    Severe hypertriglyceridemia Acute on chronic pancreatitis She follows with Dr. Maximo Spar, TG is  downtrending.  Further management per primary team.   Type IIIc diabetes On insulin    AKI on CKD  Creatinine elevated 3.06 Na better diuresed close to a liter Renal dosing lasix   Will arrange outpatient f/u with Dr Berry Bristol  For questions or updates, please contact [facility] Please consult [email or web address] for contact info under    Signed, Janelle Mediate, MD

## 2023-08-23 NOTE — Progress Notes (Signed)
 PROGRESS NOTE   Jennifer [name]  ZOX:[id number] DOB: 01-Jan-1991 DOA: 08/15/2023 PCP: Calton Catholic, PA-C   Date of Service: the patient was seen and examined on 08/23/2023  Brief Narrative:  33 year old female history of paroxysmal A-fib, astrocytoma, chronic systolic CHF with recovered EF, history of prolonged QT interval, lipoprotein deficiency, familial hypertriglyceridemia , NASH, stage 3b CKD, class I obesity, type 2 diabetes, hypertension hypothyroidism, chronic pancreatitis, gastroparesis, peripheral neuropathy, history of pancytopenia, GERD, PCOS presented to the ED with severe abdominal pain nausea dry heaves.    Patient presented in the emergency department with severe hypertriglyceridemia is initially admitted to the stepdown unit and placed on insulin  infusion due to concerns for hypertriglyceridemia induced pancreatitis.  Patient developed acute cardiogenic volume overload for which cardiology was consulted and patient was placed on intravenous Lasix  every 8 hours.   Hospitalization additionally complicated by AKI superimposed on CKD 4 and worsening hyponatremia prompting nephrology consultation.     Assessment & Plan Acute on chronic pancreatitis (HCC) Improving Thought to be secondary to acute on chronic pancreatitis secondary to severe hypertriglyceridemia Aggressively managing associated hypertriglyceridemia with fenofibrate  and Crestor  As needed opiate-based analgesics for bouts of abdominal pain. Familial hypertriglyceridemia Aggressive management of hypertriglyceridemia with fenofibrate  and Crestor  Acute renal failure superimposed on stage 3b chronic kidney disease [facility]) Nephrology following, their input is appreciated Baseline creatinine of 1.1-2.3 Creatinine slightly downtrending, currently 2.95. Strict input and output monitoring, Hyponatremia Nephrology assisting with management of this as well, their input is appreciated Patient was previously on hypertonic  saline which is since been discontinued on 5/26 Currently on intravenous Lasix  for hypervolemic hyponatremia Type 2 diabetes mellitus with hyperlipidemia (HCC) Patient now transitioned to basal bolus insulin  therapy Bouts of hypoglycemia early this morning, reducing Semglee  to 40 units twice daily Accu-Cheks before every meal and nightly with sliding scale insulin  Acute on chronic systolic CHF (congestive heart failure) (HCC) On intravenous Lasix , cardiology and nephrology following Edema improving Strict input and output monitoring Hypothyroidism Continue levothyroxine  Essential hypertension Continue amlodipine , Coreg  Blood pressures are essentially at target Gastroparesis due to DM [facility]) Continue scheduled metoclopramide  Coronary artery disease involving native coronary artery of native heart without angina pectoris Chest pain-free Continue aspirin , lipid-lowering therapy, beta-blocker    Subjective:  Patient states that her abdominal pain is mild, episodic and is substantially improved.  Patient states that her oral intake is additionally improving.  Physical Exam:  Vitals:   08/23/23 0457 08/23/23 0916 08/23/23 1023 08/23/23 1945  BP: 134/83 (!) 128/54  (!) 135/59  Pulse: 82 86  82  Resp: 18 19  20   Temp: 98.3 F (36.8 C) 98.5 F (36.9 C)  98.6 F (37 C)  TempSrc: Oral Oral  Oral  SpO2: 100% 100%  99%  Weight:   72.8 kg   Height:   4\' 9"  (1.448 m)     Constitutional: Awake alert and oriented x3, no associated distress.   Skin: no rashes, no lesions, good skin turgor noted. Eyes: Right-sided ptosis, pupils equally reactive to light, no evidence of scleral icterus or conjunctival pallor.  ENMT: Moist mucous membranes noted.  Posterior pharynx clear of any exudate or lesions.   Respiratory: clear to auscultation bilaterally, no wheezing, no crackles. Normal respiratory effort. No accessory muscle use.  Cardiovascular: Regular rate and rhythm, no murmurs / rubs /  gallops. No extremity edema. 2+ pedal pulses. No carotid bruits.  Abdomen: Abdomen is soft and nontender.  No evidence of intra-abdominal masses.  Positive bowel sounds noted in  all quadrants.   Musculoskeletal: Limited range of motion of the left upper extremity with associated contracture.  Poor muscle tone.    Data Reviewed:  I have personally reviewed and interpreted labs, imaging.  Significant findings are   CBC: Recent Labs  Lab 08/17/23 0327 08/18/23 0322 08/19/23 0744 08/20/23 0551 08/21/23 0449 08/22/23 0330 08/23/23 0421  WBC 7.1   < > 7.5 7.7 7.0 6.6 7.0  NEUTROABS 3.5  --   --   --   --   --   --   HGB 10.2*   < > 9.7* 9.0* 9.1* 8.7* 8.6*  HCT 33.1*   < > 30.5* 27.6* 28.6* 27.7* 28.1*  MCV 95.1   < > 96.5 94.5 96.3 98.9 99.6  PLT 124*   < > 99* 96* 104* 102* 126*   < > = values in this interval not displayed.   Basic Metabolic Panel: Recent Labs  Lab 08/17/23 0327 08/18/23 0322 08/19/23 0744 08/20/23 0551 08/20/23 1355 08/20/23 1644 08/20/23 2147 08/21/23 0044 08/21/23 0619 08/21/23 0859 08/21/23 1248 08/22/23 0330 08/23/23 0420 08/23/23 0844  NA 137 131* 128*   < > 120*   < > 123*   < > 125* 127* 128* 132* 135  --   K 3.8 3.8 3.8   < > 4.2  --  4.2  --  4.1  --   --  4.3 3.9  --   CL 104 97* 97*   < > 89*  --  93*  --  95*  --   --  99 99  --   CO2 22 22 19*   < > 19*  --  19*  --  15*  --   --  20* 24  --   GLUCOSE 88 272* 78   < > 172*  --  209*  --  182*  --   --  166* 84 67*  BUN 37* 45* 48*   < > 43*  --  45*  --  46*  --   --  54* 57*  --   CREATININE 2.00* 3.25* 3.29*   < > 3.15*  --  3.19*  --  2.95*  --   --  3.06* 3.01*  --   CALCIUM  8.9 8.8* 8.8*   < > 8.2*  --  8.0*  --  8.0*  --   --  8.7* 9.1  --   MG 2.2 2.2  --   --   --   --   --   --   --   --   --   --   --   --   PHOS  --   --  4.5  --   --   --   --   --   --   --   --  6.2* 5.7*  --    < > = values in this interval not displayed.   GFR: Estimated Creatinine Clearance: 22.2  mL/min (A) (by C-G formula based on SCr of 3.01 mg/dL (H)). Liver Function Tests: Recent Labs  Lab 08/17/23 0327 08/18/23 0322 08/19/23 0744 08/20/23 0551 08/22/23 0330 08/23/23 0420  AST 83* 56*  --  81*  --   --   ALT 94* 81*  --  85*  --   --   ALKPHOS 84 89  --  77  --   --   BILITOT 0.8 0.5  --  0.8  --   --  PROT 7.1 7.1  --  7.0  --   --   ALBUMIN  3.9 3.7 3.7 3.6 4.1 4.0     Code Status:  Full code.  Code status decision has been confirmed with: patient    Severity of Illness:  The appropriate patient status for this patient is INPATIENT. Inpatient status is judged to be reasonable and necessary in order to provide the required intensity of service to ensure the patient's safety. The patient's presenting symptoms, physical exam findings, and initial radiographic and laboratory data in the context of their chronic comorbidities is felt to place them at high risk for further clinical deterioration. Furthermore, it is not anticipated that the patient will be medically stable for discharge from the hospital within 2 midnights of admission.   * I certify that at the point of admission it is my clinical judgment that the patient will require inpatient hospital care spanning beyond 2 midnights from the point of admission due to high intensity of service, high risk for further deterioration and high frequency of surveillance required.*  Time spent:  50 minutes  Author:  True Fuss MD  08/23/2023 8:58 PM

## 2023-08-23 NOTE — Progress Notes (Signed)
 Physical Therapy Treatment Patient Details Name: Jennifer [name] MRN: [id number] DOB: [birth year] Today's Date: 08/23/2023   History of Present Illness Patient is a 33 year old female who presented on 5/20 with nausea, abdominal pain and dry heaving. Patient was admitted with hypertriglyceridemia, acute on chronic pancreatitis, and non ischemic cardiomyopathy with mildly reduced EF. PMH: gastroparesis, DM II hyperglycemia, HTN, acute renail failure, CKD IV, hyponatremia, CAD. Obesity, hypothyroidism. L foot drop,    PT Comments   Pt admitted with above diagnosis.  Pt currently with functional limitations due to the deficits listed below (see PT Problem List). Pt in bed when PT arrived. Pt agreeable to therapy intervention. Pt sated her Bp has been better. PT assessed for orthostatic hypotension with positive findings and pt asymptomatic, please see below. Pt on 3 L/min and no supplemental O2 PLOF, pt requested to remove and stated it was bothering her. Pt saturating at 99% at rest on 3 L/min and at rest on RA 97% with no reports of SOB. Pt required min A for supine to sit, min A for sit to stand, CGA for static standing balance at RW, pt exhibited L UE and LE gross motor coordination, control and strength deficits, L UE difficulty obtaining and maintaining grasp on RW. Pt required CGA to min A for gait tasks with RW and recliner close 50 feet and no reports of SOB with O2 saturation 96% on RA. Pt left seated in recliner and all needs in place.  Pending pt progression with therapy in hospital pt may benefit from short term rehab vs [facility] services. Pt will benefit from acute skilled PT to increase their independence and safety with mobility to allow discharge.   Per TOC note pt is declining short term rehab, DME and states has a friend whom lives with her.  Bp supine 134/71 Bp seated EOB 120/65 Bp immediate standing 113/66 Bp s/p 3 min standing 126/63    If plan is discharge home, recommend the  following: Two people to help with walking and/or transfers;A lot of help with bathing/dressing/bathroom;Assistance with cooking/housework;Assist for transportation;Help with stairs or ramp for entrance   Can travel by private vehicle     No  Equipment Recommendations  Other (comment) (TBD, if recommendation RW will need youth)    Recommendations for Other Services       Precautions / Restrictions Precautions Precautions: Fall Precaution/Restrictions Comments: monitor BP Restrictions Weight Bearing Restrictions Per Provider Order: No     Mobility  Bed Mobility Overal bed mobility: Needs Assistance Bed Mobility: Supine to Sit     Supine to sit: Min assist     General bed mobility comments: increased time, A for trunk    Transfers Overall transfer level: Needs assistance Equipment used: Rolling walker (2 wheels) Transfers: Sit to/from Stand Sit to Stand: Min assist, From elevated surface           General transfer comment: pt required cues and no reports of dizziness with positional changes today.    Ambulation/Gait Ambulation/Gait assistance: Min assist, +2 safety/equipment (chair close) Gait Distance (Feet): 50 Feet Assistive device: Rolling walker (2 wheels) Gait Pattern/deviations: Step-to pattern, Decreased stance time - left Gait velocity: decreased     General Gait Details: gait trial with RW, pt exhibited decreased L UE grasp and required A to place L UE and occational A to reposition L UE on handhold of RW, significant L toe out, step to pattern and min A for RW management balance and safety with recliner close.  gait assessed on RA.   Stairs             Wheelchair Mobility     Tilt Bed    Modified Rankin (Stroke Patients Only)       Balance Overall balance assessment: Needs assistance Sitting-balance support: Feet supported Sitting balance-Leahy Scale: Fair     Standing balance support: During functional activity, Bilateral upper  extremity supported, Reliant on assistive device for balance Standing balance-Leahy Scale: Poor                              Communication Communication Communication: No apparent difficulties  Cognition Arousal: Alert Behavior During Therapy: Flat affect   PT - Cognitive impairments: No apparent impairments                         Following commands: Intact      Cueing    Exercises      General Comments        Pertinent Vitals/Pain Pain Assessment Pain Location: pt indicated that her pain was "managed" today    Home Living                          Prior Function            PT Goals (current goals can now be found in the care plan section) Acute Rehab PT Goals Patient Stated Goal: to go home PT Goal Formulation: With patient Time For Goal Achievement: 09/04/23 Potential to Achieve Goals: Good Progress towards PT goals: Progressing toward goals    Frequency    Min 3X/week      PT Plan      Co-evaluation              AM-PAC PT "6 Clicks" Mobility   Outcome Measure  Help needed turning from your back to your side while in a flat bed without using bedrails?: A Little Help needed moving from lying on your back to sitting on the side of a flat bed without using bedrails?: A Little Help needed moving to and from a bed to a chair (including a wheelchair)?: A Little Help needed standing up from a chair using your arms (e.g., wheelchair or bedside chair)?: A Little Help needed to walk in hospital room?: A Little Help needed climbing 3-5 steps with a railing? : Total 6 Click Score: 16    End of Session Equipment Utilized During Treatment: Gait belt;Oxygen Activity Tolerance: Other (comment) (orthostatic hypotension) Patient left: with call bell/phone within reach;in chair Nurse Communication: Mobility status PT Visit Diagnosis: Unsteadiness on feet (R26.81);Other abnormalities of gait and mobility (R26.89);Muscle  weakness (generalized) (M62.81);Difficulty in walking, not elsewhere classified (R26.2)     Time: 7829-5621 PT Time Calculation (min) (ACUTE ONLY): 24 min  Charges:    $Gait Training: 8-22 mins $Therapeutic Activity: 8-22 mins PT General Charges $$ ACUTE PT VISIT: 1 Visit                     Cary Clarks, PT Acute Rehab    Annalee Kiang 08/23/2023, 3:34 PM

## 2023-08-23 NOTE — Assessment & Plan Note (Signed)
 Nephrology following, their input is appreciated Baseline creatinine of 1.1-2.3 Creatinine slightly downtrending, currently 2.95. Strict input and output monitoring,

## 2023-08-23 NOTE — Assessment & Plan Note (Signed)
 Continue levothyroxine 

## 2023-08-23 NOTE — Assessment & Plan Note (Signed)
 On intravenous Lasix , cardiology and nephrology following Edema improving Strict input and output monitoring

## 2023-08-23 NOTE — Progress Notes (Addendum)
 Jennifer [name]  Subjective:  UOP yest 950,  Today UOP = 2800 cc so far Creat stable w/ diuresis  Na+ up to 135 Pt alert and interactive  Vitals:   08/22/23 2242 08/23/23 0457 08/23/23 0916 08/23/23 1023  BP: 125/69 134/83 (!) 128/54   Pulse: 80 82 86   Resp: 20 18 19    Temp: 98.3 F (36.8 C) 98.3 F (36.8 C) 98.5 F (36.9 C)   TempSrc: Oral Oral Oral   SpO2: 100% 100% 100%   Weight:    72.8 kg  Height:    4\' 9"  (1.448 m)    Exam: General:  adult female, alert today Eyes: periorbital edema improving Heart: S1S2 no rub Lungs: clear to auscultation  Abdomen: distended, almost tight Extremities: diffuse 1-2+ UE and LE edema, not as tight Neuro: alert, Ox 3 GU no foley    Renal-related home meds: Norvasc  5 every day Rocaltrol  0.25 mcg daily Coreg  9.375 mg bid Hydralazine  75 mg tid Sod bicarb 1300 bid Demadex  60mg  po daily Others: Creon , Topamax , Imitrex , Carafate , Crestor , Repatha , Compazine , Lyrica , PPI, MVI, Reglan , Synthroid , Corlanor , insulin , fenofibrate , Cymbalta , Fioricet , aspirin , Elavil   Date   Creat  eGFR (ml/min) 2019- 2020  0.66- 0.81 2021   1.10- 1.24 2022   0.95- 3.91 2023   1.06- 3.40 2024   1.04- 3.13 Jan- feb 2025  1.70- 2.92 21- 40 ml/min    Mar 2025  1.63- 1.78 April 2025  1.74- 2.34 28- 40 ml/min  08/07/23  2.18- 2.40 08/14/23  1.94 5/20   2.26 5/21   2.35 5/22   2.00 5/23   3.25 5/24   3.29 5/25   3.32  18 ml/min  08/21/23  2.95  21 ml/min  UA 08/18/23 - prot neg, 0-5 rbc / wbc UNa < 10, UCr 80 Renal US  - 8.7/ 9.9 cm kidneys w/ no hydro  Summary: Jennifer [name] is a 33 y.o. female with a history of astrocytoma, atrial fibrillation, chronic systolic congestive heart failure with recovered EF, NASH, and CKD stage IV who presented to the hospital with abdominal pain and nausea. She was found to have hypertriglyceridemia felt induced by pancreatitis.    Assessment/ Plan: AKI on CKD 3b: b/l creat 1.7- 2.3 from April  2025, eGFR 28-40 ml/min. F/b Dr Edson Graces at [facility]. Creat here was 1.9 on admission in the setting of pancreatitis with assoc hypertriglyceridemia. Creat then rose up to 3.3 yest and down to 2.95 today. I/O were + 4 L. CXR 5/26 showed no edema. On exam pt was quite edematous x 4 extremities and wt's were up 6kg. UA negative, renal US  no obstruction and urine lytes were pre-renal. No nephrotoxins. AKI suspected due to decomp CHF/ vol overload. BP's were soft. The Coreg  was dc'd, and IV lasix  started at 80 bid, then ^'d to 100mg  tid and this is getting a good response. BP's are better/ higher. Edema improving, pt looks better/ more alert. Will cont diuresing another 1-2 days most likely. Will follow.  Hyponatremia: SP 3% saline (dc'd 5/26). Cont IV lasix  for treatment of hypervolemic hyponatremia. Improving today to 135. Resolved.  HTN: as above, bp's were soft and Coreg  dc'd. BP's stable now at 128/54, not on any BP meds today.  Hypervolemia: improving as above Acute on chronic pancreatitis: w/ severe ^TG. Per pmd. DM type 2     Larry Poag MD  [facility] 08/23/2023, 2:37 PM  Recent Labs  Lab 08/22/23 0330 08/23/23 0420 08/23/23 0421  HGB  8.7*  --  8.6*  ALBUMIN  4.1 4.0  --   CALCIUM  8.7* 9.1  --   PHOS 6.2* 5.7*  --   CREATININE 3.06* 3.01*  --   K 4.3 3.9  --    No results for input(s): "IRON", "TIBC", "FERRITIN" in the last 168 hours. Inpatient medications:  aspirin  EC  81 mg Oral Daily   azelastine   2 spray Each Nare Q0600   calcitRIOL  0.25 mcg Oral Daily   Chlorhexidine  Gluconate Cloth  6 each Topical QHS   enoxaparin  (LOVENOX ) injection  30 mg Subcutaneous Q24H   famotidine   10 mg Oral Daily   fenofibrate   54 mg Oral Daily   insulin  aspart  0-9 Units Subcutaneous TID WC   insulin  glargine-yfgn  40 Units Subcutaneous BID   ivabradine   5 mg Oral BID WC   And   ivabradine   2.5 mg Oral BID WC   levothyroxine   150 mcg Oral Q0600   lipase/protease/amylase  36,000 Units Oral TID AC    loratadine   10 mg Oral QPM   metoCLOPramide   5 mg Oral TID AC & HS   pantoprazole   40 mg Oral Daily   polyethylene glycol  17 g Oral BID   pregabalin   150 mg Oral Daily   rosuvastatin   40 mg Oral Daily   senna-docusate  1 tablet Oral BID   sodium bicarbonate   650 mg Oral BID   Vitamin D  (Ergocalciferol )  50,000 Units Oral Q7 days    furosemide  100 mg (08/23/23 1356)   acetaminophen  **OR** acetaminophen , butalbital -acetaminophen -caffeine , camphor-menthol, diphenhydrAMINE , HYDROmorphone  (DILAUDID ) injection, ipratropium-albuterol , midodrine, mouth rinse, polyethylene glycol, prochlorperazine 

## 2023-08-23 NOTE — Inpatient Diabetes Management (Signed)
 Inpatient Diabetes Program Recommendations  AACE/ADA: New Consensus Statement on Inpatient Glycemic Control (2015)  Target Ranges:  Prepandial:   less than 140 mg/dL      Peak postprandial:   less than 180 mg/dL (1-2 hours)      Critically ill patients:  140 - 180 mg/dL   Lab Results  Component Value Date   GLUCAP 97 08/23/2023   HGBA1C 6.1 (H) 05/06/2023    Review of Glycemic Control  Diabetes history: DM2 Outpatient Diabetes medications: U-500 300 in am and 250 at bedtime, Humalog  5-300 units with breakfast, lunch and dinner meal (1 unit/2 g CHO and 5 units per 50 mg/dL correction before meals Current orders for Inpatient glycemic control: Semglee  40 BID, Novolog  0-9 TID  Hypos today: HgbA1C 6.1% on 05/06/23  Inpatient Diabetes Program Recommendations:    Please order updated HgbA1C  Agree with lowering Semglee  to 40 units BID  Has not required any meal coverage insulin .   CBGs had increased to 351 and 380 on 5/22, otherwise CBGs acceptable.  Speak with pt today. For likely d/c.  Thank you. Joni Net, RD, LDN, CDCES Inpatient Diabetes Coordinator [phone]

## 2023-08-23 NOTE — TOC Progression Note (Signed)
 Transition of Care [facility]) - Progression Note    Patient Details  Name: Jennifer [name] MRN: [id number] Date of Birth: Jan 14, 1991  Transition of [facility]) CM/SW Contact  Gertha Ku, LCSW Phone Number: 08/23/2023, 1:54 PM  Clinical Narrative:     CSW met with the pt to present SNF bed offers. Pt declined SNF placement and expressed a desire to return home with home health services. Pt has no preference for a specific home health agency.  CSW also discussed the recommendation for a rolling walker; however, the pt declined and denies the need for any other DME. The pt reports living with a friend and will need transportation assistance home upon discharge.  [facility] has accepted the pt for HHPT/OT.Home health orders will need to be placed.TOC to follow.   Expected Discharge Plan: Home w Home Health Services Barriers to Discharge: Continued Medical Work up  Expected Discharge Plan and Services       Living arrangements for the past 2 months: Apartment                                       Social Determinants of Health (SDOH) Interventions SDOH Screenings   Food Insecurity: No Food Insecurity (08/15/2023)  Housing: Low Risk  (08/15/2023)  Transportation Needs: No Transportation Needs (08/15/2023)  Utilities: Not At Risk (08/15/2023)  Alcohol  Screen: Low Risk  (12/10/2021)  Depression (PHQ2-9): Low Risk  (12/10/2021)  Financial Resource Strain: Low Risk  (05/31/2023)   Received from [facility]  Physical Activity: Unknown (05/31/2023)   Received from [facility]  Social Connections: Socially Integrated (05/31/2023)   Received from [facility]  Stress: No Stress Concern Present (05/31/2023)   Received from [facility]  Tobacco Use: Low Risk  (08/15/2023)    Readmission Risk Interventions    08/21/2023    2:55 PM 05/08/2023    4:21 PM 01/04/2023   11:45 AM  Readmission Risk Prevention Plan  Transportation Screening Complete Complete Complete   Medication Review [employer]) Complete Referral to Pharmacy Complete  PCP or Specialist appointment within 3-5 days of discharge Complete Complete Complete  HRI or Home Care Consult Complete Complete Complete  SW Recovery Care/Counseling Consult Complete Complete Complete  Palliative Care Screening Not Applicable Not Applicable Not Applicable  Skilled Nursing Facility Complete Not Applicable Not Applicable

## 2023-08-23 NOTE — Assessment & Plan Note (Signed)
 Patient now transitioned to basal bolus insulin  therapy Bouts of hypoglycemia early this morning, reducing Semglee  to 40 units twice daily Accu-Cheks before every meal and nightly with sliding scale insulin 

## 2023-08-23 NOTE — Progress Notes (Signed)
   08/23/23 2107  BiPAP/CPAP/SIPAP  BiPAP/CPAP/SIPAP Pt Type Adult  Reason BIPAP/CPAP not in use Non-compliant (Patient continues to refuse CPAP qhs.  Patient encouraged to contact RT should she change her mind.)

## 2023-08-23 NOTE — Assessment & Plan Note (Signed)
 Aggressive management of hypertriglyceridemia with fenofibrate  and Crestor 

## 2023-08-23 NOTE — Assessment & Plan Note (Signed)
 Improving Thought to be secondary to acute on chronic pancreatitis secondary to severe hypertriglyceridemia Aggressively managing associated hypertriglyceridemia with fenofibrate  and Crestor  As needed opiate-based analgesics for bouts of abdominal pain.

## 2023-08-23 NOTE — Assessment & Plan Note (Signed)
 Chest pain-free Continue aspirin , lipid-lowering therapy, beta-blocker

## 2023-08-23 NOTE — Hospital Course (Addendum)
 33 year old female history of paroxysmal A-fib, astrocytoma, chronic systolic CHF with recovered EF, history of prolonged QT interval, lipoprotein deficiency, familial hypertriglyceridemia , NASH, stage 3b CKD, class I obesity, type 2 diabetes, hypertension hypothyroidism, chronic pancreatitis, gastroparesis, peripheral neuropathy, history of pancytopenia, GERD, PCOS presented to the ED with severe abdominal pain nausea dry heaves.    Patient presented in the emergency department with severe hypertriglyceridemia is initially admitted to the stepdown unit and placed on insulin  infusion due to concerns for hypertriglyceridemia induced pancreatitis.  In the days that followed patient clinically improved and was transitioned off of insulin  infusion to subcutaneous insulin .  Concerning the patient's severe hypertriglyceridemia, patient was managed with a combination of fenofibrate  and Crestor .    As a result of intravenous volume resuscitation early in the hospitalization, patient developed acute cardiogenic volume overload for which cardiology was consulted and patient was placed on intravenous Lasix  every 8 hours. Hospitalization was additionally complicated by AKI superimposed on CKD 4 and worsening hyponatremia prompting nephrology consultation.    With a combination of intermittent hypertonic saline and diuretics sodium levels corrected and edema improved.  Patient was eventually transition from intravenous Lasix  to oral torsemide  and hypertonic saline was additionally discontinued.  By the time of discharge patient continued to experience some epigastric pain however this was felt to be chronic.  Patient was tolerating oral intake well and her activity level had dramatically improved.  Patient was discharged home in improved and stable condition on 08/26/2023 with home health physical therapy.

## 2023-08-23 NOTE — Assessment & Plan Note (Signed)
 Nephrology assisting with management of this as well, their input is appreciated Patient was previously on hypertonic saline which is since been discontinued on 5/26 Currently on intravenous Lasix  for hypervolemic hyponatremia

## 2023-08-24 DIAGNOSIS — I10 Essential (primary) hypertension: Secondary | ICD-10-CM | POA: Diagnosis not present

## 2023-08-24 DIAGNOSIS — N185 Chronic kidney disease, stage 5: Secondary | ICD-10-CM | POA: Diagnosis not present

## 2023-08-24 DIAGNOSIS — K859 Acute pancreatitis without necrosis or infection, unspecified: Secondary | ICD-10-CM | POA: Diagnosis not present

## 2023-08-24 DIAGNOSIS — I5021 Acute systolic (congestive) heart failure: Secondary | ICD-10-CM

## 2023-08-24 DIAGNOSIS — I5023 Acute on chronic systolic (congestive) heart failure: Secondary | ICD-10-CM | POA: Diagnosis not present

## 2023-08-24 DIAGNOSIS — N179 Acute kidney failure, unspecified: Secondary | ICD-10-CM | POA: Diagnosis not present

## 2023-08-24 DIAGNOSIS — I2510 Atherosclerotic heart disease of native coronary artery without angina pectoris: Secondary | ICD-10-CM | POA: Diagnosis not present

## 2023-08-24 LAB — BASIC METABOLIC PANEL WITH GFR
Anion gap: 12 (ref 5–15)
BUN: 56 mg/dL — ABNORMAL HIGH (ref 6–20)
CO2: 29 mmol/L (ref 22–32)
Calcium: 9.1 mg/dL (ref 8.9–10.3)
Chloride: 93 mmol/L — ABNORMAL LOW (ref 98–111)
Creatinine, Ser: 2.89 mg/dL — ABNORMAL HIGH (ref 0.44–1.00)
GFR, Estimated: 21 mL/min — ABNORMAL LOW (ref >60)
Glucose, Bld: 228 mg/dL — ABNORMAL HIGH (ref 70–99)
Potassium: 3.7 mmol/L (ref 3.5–5.1)
Sodium: 134 mmol/L — ABNORMAL LOW (ref 135–145)

## 2023-08-24 LAB — MAGNESIUM: Magnesium: 2.3 mg/dL (ref 1.7–2.4)

## 2023-08-24 LAB — GLUCOSE, CAPILLARY
Glucose-Capillary: 153 mg/dL — ABNORMAL HIGH (ref 70–99)
Glucose-Capillary: 234 mg/dL — ABNORMAL HIGH (ref 70–99)
Glucose-Capillary: 225 mg/dL — ABNORMAL HIGH (ref 70–99)
Glucose-Capillary: 236 mg/dL — ABNORMAL HIGH (ref 70–99)
Glucose-Capillary: 283 mg/dL — ABNORMAL HIGH (ref 70–99)

## 2023-08-24 MED ORDER — TORSEMIDE 20 MG PO TABS
60.0000 mg | ORAL_TABLET | Freq: Every day | ORAL | Status: DC
  Administered 2023-08-25 – 2023-08-26 (×2): 60 mg via ORAL
  Filled 2023-08-24 (×2): qty 3

## 2023-08-24 MED ORDER — OXYCODONE HCL 5 MG PO TABS
5.0000 mg | ORAL_TABLET | Freq: Once | ORAL | Status: AC
  Administered 2023-08-24: 5 mg via ORAL
  Filled 2023-08-24: qty 1

## 2023-08-24 MED ORDER — SODIUM BICARBONATE 650 MG PO TABS
325.0000 mg | ORAL_TABLET | Freq: Two times a day (BID) | ORAL | Status: DC
  Administered 2023-08-24 – 2023-08-26 (×4): 325 mg via ORAL
  Filled 2023-08-24 (×4): qty 1

## 2023-08-24 MED ORDER — INSULIN ASPART 100 UNIT/ML IJ SOLN
3.0000 [IU] | Freq: Once | INTRAMUSCULAR | Status: AC
  Administered 2023-08-24: 3 [IU] via SUBCUTANEOUS

## 2023-08-24 MED ORDER — POTASSIUM CHLORIDE CRYS ER 20 MEQ PO TBCR
40.0000 meq | EXTENDED_RELEASE_TABLET | Freq: Once | ORAL | Status: AC
  Administered 2023-08-24: 40 meq via ORAL
  Filled 2023-08-24: qty 2

## 2023-08-24 MED ORDER — HYDRALAZINE HCL 20 MG/ML IJ SOLN
5.0000 mg | Freq: Four times a day (QID) | INTRAMUSCULAR | Status: DC | PRN
  Administered 2023-08-24: 5 mg via INTRAVENOUS
  Filled 2023-08-24: qty 1

## 2023-08-24 MED ORDER — FUROSEMIDE 10 MG/ML IJ SOLN
100.0000 mg | Freq: Three times a day (TID) | INTRAVENOUS | Status: AC
  Administered 2023-08-24: 100 mg via INTRAVENOUS
  Filled 2023-08-24: qty 10

## 2023-08-24 NOTE — Assessment & Plan Note (Addendum)
 On intravenous Lasix , cardiology and nephrology following Near euvolemia, substantial improvement in peripheral edema Cardiology transitioning to oral diuretic therapy on 5/30 Strict input and output monitoring

## 2023-08-24 NOTE — Assessment & Plan Note (Signed)
 Aggressive management of hypertriglyceridemia with fenofibrate  and Crestor 

## 2023-08-24 NOTE — Assessment & Plan Note (Addendum)
 Nephrology following, their input is appreciated Baseline creatinine of 1.1-2.3 Creatinine continuing to slightly downtrend, currently 2.89  Strict input and output monitoring,

## 2023-08-24 NOTE — Progress Notes (Signed)
 Rounding Note    Patient Name: Jennifer [name] Date of Encounter: 08/24/2023  [facility] Cardiologist: Knox Perl, MD   Subjective   [name] examined sitting up in recliner with legs down. She reports ongoing SOB and orthopnea, but also states she has refused CPAP offered by RN here. I encouraged CPAP use. She states she feels like she is still hypervolemic.   Inpatient Medications    Scheduled Meds:  aspirin  EC  81 mg Oral Daily   azelastine   2 spray Each Nare Q0600   calcitRIOL   0.25 mcg Oral Daily   Chlorhexidine  Gluconate Cloth  6 each Topical QHS   enoxaparin  (LOVENOX ) injection  30 mg Subcutaneous Q24H   famotidine   10 mg Oral Daily   fenofibrate   54 mg Oral Daily   insulin  aspart  0-9 Units Subcutaneous TID WC   insulin  glargine-yfgn  40 Units Subcutaneous BID   ivabradine   5 mg Oral BID WC   And   ivabradine   2.5 mg Oral BID WC   levothyroxine   150 mcg Oral Q0600   lipase/protease/amylase  36,000 Units Oral TID AC   loratadine   10 mg Oral QPM   metoCLOPramide   5 mg Oral TID AC & HS   pantoprazole   40 mg Oral Daily   polyethylene glycol  17 g Oral BID   potassium chloride   40 mEq Oral Once   pregabalin   150 mg Oral Daily   rosuvastatin   40 mg Oral Daily   senna-docusate  1 tablet Oral BID   sodium bicarbonate   650 mg Oral BID   Vitamin D  (Ergocalciferol )  50,000 Units Oral Q7 days   Continuous Infusions:  furosemide  100 mg (08/24/23 0644)   PRN Meds: acetaminophen  **OR** acetaminophen , butalbital -acetaminophen -caffeine , camphor-menthol , diphenhydrAMINE , HYDROmorphone  (DILAUDID ) injection, ipratropium-albuterol , midodrine , mouth rinse, polyethylene glycol, prochlorperazine    Vital Signs    Vitals:   08/23/23 1023 08/23/23 1945 08/24/23 0322 08/24/23 0500  BP:  (!) 135/59 (!) 156/77   Pulse:  82 87   Resp:  20 17   Temp:  98.6 F (37 C) 98.4 F (36.9 C)   TempSrc:  Oral Oral   SpO2:  99% 98%   Weight: 72.8 kg   71.1 kg  Height: 4\' 9"   (1.448 m)       Intake/Output Summary (Last 24 hours) at 08/24/2023 0947 Last data filed at 08/24/2023 0754 Gross per 24 hour  Intake 360 ml  Output 4900 ml  Net -4540 ml      08/24/2023    5:00 AM 08/23/2023   10:23 AM 08/22/2023    5:00 AM  Last 3 Weights  Weight (lbs) 156 lb 12 oz 160 lb 8 oz 173 lb 1 oz  Weight (kg) 71.1 kg 72.802 kg 78.5 kg      Telemetry    Sinus rhythm in the 80s - Personally Reviewed  ECG    No new tracing - Personally Reviewed  Physical Exam   GEN: No acute distress, obese female Neck: No JVD Cardiac: RRR, no murmurs, rubs, or gallops.  Respiratory: crackles in bases R > L GI: Soft, nontender, non-distended  MS: Mild LE edema Neuro:  Nonfocal  Psych: Normal affect   Labs    High Sensitivity Troponin:  No results for input(s): "TROPONINIHS" in the last 720 hours.   Chemistry Recent Labs  Lab 08/18/23 5956 08/19/23 3875 08/20/23 0551 08/20/23 1355 08/22/23 0330 08/23/23 0420 08/23/23 0844 08/24/23 0417  NA 131*   < > 121*   < >  132* 135  --  134*  K 3.8   < > 3.8   < > 4.3 3.9  --  3.7  CL 97*   < > 91*   < > 99 99  --  93*  CO2 22   < > 19*   < > 20* 24  --  29  GLUCOSE 272*   < > 81   < > 166* 84 67* 228*  BUN 45*   < > 46*   < > 54* 57*  --  56*  CREATININE 3.25*   < > 3.32*   < > 3.06* 3.01*  --  2.89*  CALCIUM  8.8*   < > 8.3*   < > 8.7* 9.1  --  9.1  MG 2.2  --   --   --   --   --   --  2.3  PROT 7.1  --  7.0  --   --   --   --   --   ALBUMIN  3.7   < > 3.6  --  4.1 4.0  --   --   AST 56*  --  81*  --   --   --   --   --   ALT 81*  --  85*  --   --   --   --   --   ALKPHOS 89  --  77  --   --   --   --   --   BILITOT 0.5  --  0.8  --   --   --   --   --   GFRNONAA 19*   < > 18*   < > 20* 20*  --  21*  ANIONGAP 12   < > 11   < > 13 12  --  12   < > = values in this interval not displayed.    Lipids  Recent Labs  Lab 08/21/23 0449  TRIG 635*    Hematology Recent Labs  Lab 08/21/23 0449 08/22/23 0330 08/23/23 0421   WBC 7.0 6.6 7.0  RBC 2.97* 2.80* 2.82*  HGB 9.1* 8.7* 8.6*  HCT 28.6* 27.7* 28.1*  MCV 96.3 98.9 99.6  MCH 30.6 31.1 30.5  MCHC 31.8 31.4 30.6  RDW 15.0 15.3 15.4  PLT 104* 102* 126*   Thyroid   Recent Labs  Lab 08/19/23 1309  TSH 6.758*    BNP Recent Labs  Lab 08/20/23 0551  BNP 278.7*    DDimer No results for input(s): "DDIMER" in the last 168 hours.   Radiology    No results found.  Cardiac Studies   Echo 08/16/23: 1. Left ventricular ejection fraction, by estimation, is 45 to 50%. The  left ventricle has mildly decreased function. The left ventricle  demonstrates global hypokinesis. Left ventricular diastolic function could  not be evaluated.   2. Right ventricular systolic function is normal. The right ventricular  size is normal. There is normal pulmonary artery systolic pressure.   3. The mitral valve is normal in structure. Trivial mitral valve  regurgitation. No evidence of mitral stenosis.   4. The aortic valve is tricuspid. Aortic valve regurgitation is not  visualized. No aortic stenosis is present.   5. The inferior vena cava is normal in size with <50% respiratory  variability, suggesting right atrial pressure of 8 mmHg.   Patient Profile     33 y.o. female with a hx of CAD with RCA occlusion cath 2022,  nonischemic cardiomyopathy, cardio mems implant 2023, pulmonary hypertension, TR astrocytoma status postresection in 1997 with residual left-sided weakness, CKD stage IV, severe hypertriglyceridemia,  chronic pancreatitis, NASH, type 2 diabetes, hypertension, hypothyroidism, PCOS, OSA awaiting CPAP in who is being seen 08/17/2023 for the evaluation of abnormal echocardiogram at the request of Dr. Hildy Lowers.   Assessment & Plan    Acute on chronic systolic heart failure NICM CKD IV Hyponatremia  Hypertension - LVEF 25-30% in 2022 - LVEF 50-55% with normal RV on echo 09/2022 - echo this admission with LVEF 45-50%, normal RV function - no  spironolactone  due to hyperkalemia - no SGLT2i due to frequent GU infections - GDMT further limited by CKD - cardiomems device in place - PTA on coreg  9.375 mg BID, 60 mg torsemide  daily, ivabradine  7.5 mg BID, 75 mg hydralazine  TID - IVF for pancreatitis, IV lasix  resulted in hyponatremia, nephrology started on hypertonic saline - Na now 134, sCr 2.89 with K 3.7 - fluid balance has been difficult - she remains on 100 mg IV lasix  TID - she is also still on midodrine 5 mg TID with meals and bedtime PRN - has not received yesterday or today - she remains mildly volume up - continue to avoid midodrine today, continue IV lasix  for at least another day, creatinine trending down - I encouraged her to use CPAP tonight if able - hoping to transition her to PO torsemide  in the next 24-48 hrs   Severe hypertriglyceridemia  Acute on chronic pancreatitis Familial hypertriglyceridemia  - TG trending down when last checked 08/21/23 - on fenofibrate  and crestor  - she reports continued abdominal pain   CAD - no chest pain - continue ASA, BB      For questions or updates, please contact [facility] Please consult [email or web address] for contact info under        Signed, Lamond Pilot, PA  08/24/2023, 9:47 AM

## 2023-08-24 NOTE — Assessment & Plan Note (Addendum)
 Nephrology assisting with management of this as well, their input is appreciated Patient was previously on hypertonic saline which is since been discontinued on 5/26 Currently on intravenous Lasix  for hypervolemic hyponatremia, plan to transition patient to oral diuretic on 5/30

## 2023-08-24 NOTE — Plan of Care (Signed)

## 2023-08-24 NOTE — Assessment & Plan Note (Signed)
 Continue levothyroxine 

## 2023-08-24 NOTE — Assessment & Plan Note (Signed)
 Patient now transitioned to basal bolus insulin  therapy Bouts of hypoglycemia early this morning, reducing Semglee  to 40 units twice daily Accu-Cheks before every meal and nightly with sliding scale insulin 

## 2023-08-24 NOTE — Assessment & Plan Note (Signed)
 Continue scheduled metoclopramide 

## 2023-08-24 NOTE — Progress Notes (Signed)
 [facility] Progress Note  Subjective:  UOP 3.3 L yest and 3.4 L today so far Creat down to 2.8 today  Vitals:   08/23/23 1945 08/24/23 0322 08/24/23 0500 08/24/23 1133  BP: (!) 135/59 (!) 156/77  (!) 166/80  Pulse: 82 87  79  Resp: 20 17  19   Temp: 98.6 F (37 C) 98.4 F (36.9 C)  98.5 F (36.9 C)  TempSrc: Oral Oral  Oral  SpO2: 99% 98%  94%  Weight:   71.1 kg   Height:        Exam: General:  adult female, alert today Eyes: periorbital edema improving Heart: S1S2 no rub Lungs: clear to auscultation  Abdomen: distended, almost tight Extremities: diffuse 1-2+ UE and LE edema, not as tight Neuro: alert, Ox 3 GU no foley    Renal-related home meds: Norvasc  5 every day Rocaltrol 0.25 mcg daily Coreg  9.375 mg bid Hydralazine  75 mg tid Sod bicarb 1300 bid Demadex  60mg  po daily Others: Creon , Topamax , Imitrex , Carafate , Crestor , Repatha , Compazine , Lyrica , PPI, MVI, Reglan , Synthroid , Corlanor , insulin , fenofibrate , Cymbalta , Fioricet , aspirin , Elavil   Date   Creat  eGFR (ml/min) 2019- 2020  0.66- 0.81 2021   1.10- 1.24 2022   0.95- 3.91 2023   1.06- 3.40 2024   1.04- 3.13 Jan- feb 2025  1.70- 2.92 21- 40 ml/min    Mar 2025  1.63- 1.78 April 2025  1.74- 2.34 28- 40 ml/min  08/07/23  2.18- 2.40 08/14/23  1.94 5/20   2.26 5/21   2.35 5/22   2.00 5/23   3.25 5/24   3.29 5/25   3.32  18 ml/min  08/21/23  2.95  21 ml/min  UA 08/18/23 - prot neg, 0-5 rbc / wbc UNa < 10, UCr 80 Renal US  - 8.7/ 9.9 cm kidneys w/ no hydro  Summary: Jennifer [name] is a 33 y.o. female with a history of astrocytoma, atrial fibrillation, chronic systolic congestive heart failure with recovered EF, NASH, and CKD stage IV who presented to the hospital with abdominal pain and nausea. She was found to have hypertriglyceridemia felt induced by pancreatitis.    Assessment/ Plan: AKI on CKD 3b: b/l creat 1.7- 2.3 from April 2025, eGFR 28-40 ml/min. F/b Dr Edson Graces at [facility]. Creat here  was 1.9 on admission in the setting of pancreatitis with assoc hypertriglyceridemia. Creat then rose up to 3.3 yest and down to 2.95 today. I/O were + 4 L. CXR 5/26 showed no edema. On exam pt was quite edematous x 4 extremities and wt's were up 6kg. UA negative, renal US  no obstruction and urine lytes were pre-renal. No nephrotoxins. AKI suspected due to decomp CHF/ vol overload. BP's were soft. The Coreg  was dc'd, and IV lasix  started. Good diuresis the last 2-3 days. Will cont IV lasix  through tonight then start back on home torsemide  tomorrow (60mg  every day).  Hyponatremia: SP 3% saline (dc'd 5/26). Resolved now.  HTN: as above, bp's were soft and Coreg  dc'd. BP's much better now. 140/60 range.  Hypervolemia: abd swelling not much better but UE/ LE edema much better. Lungs clear.  Acute on chronic pancreatitis: w/ severe ^TG. Per pmd. DM type 2     Larry Poag MD  [facility] 08/24/2023, 3:33 PM  Recent Labs  Lab 08/22/23 0330 08/23/23 0420 08/23/23 0421 08/24/23 0417  HGB 8.7*  --  8.6*  --   ALBUMIN  4.1 4.0  --   --   CALCIUM  8.7* 9.1  --  9.1  PHOS 6.2* 5.7*  --   --   CREATININE 3.06* 3.01*  --  2.89*  K 4.3 3.9  --  3.7   No results for input(s): "IRON", "TIBC", "FERRITIN" in the last 168 hours. Inpatient medications:  aspirin  EC  81 mg Oral Daily   azelastine   2 spray Each Nare Q0600   calcitRIOL  0.25 mcg Oral Daily   Chlorhexidine  Gluconate Cloth  6 each Topical QHS   enoxaparin  (LOVENOX ) injection  30 mg Subcutaneous Q24H   famotidine   10 mg Oral Daily   fenofibrate   54 mg Oral Daily   insulin  aspart  0-9 Units Subcutaneous TID WC   insulin  glargine-yfgn  40 Units Subcutaneous BID   ivabradine   5 mg Oral BID WC   And   ivabradine   2.5 mg Oral BID WC   levothyroxine   150 mcg Oral Q0600   lipase/protease/amylase  36,000 Units Oral TID AC   loratadine   10 mg Oral QPM   metoCLOPramide   5 mg Oral TID AC & HS   pantoprazole   40 mg Oral Daily   polyethylene glycol  17 g  Oral BID   pregabalin   150 mg Oral Daily   rosuvastatin   40 mg Oral Daily   senna-docusate  1 tablet Oral BID   sodium bicarbonate   650 mg Oral BID   Vitamin D  (Ergocalciferol )  50,000 Units Oral Q7 days    furosemide  100 mg (08/24/23 1325)   acetaminophen  **OR** acetaminophen , butalbital -acetaminophen -caffeine , camphor-menthol, diphenhydrAMINE , HYDROmorphone  (DILAUDID ) injection, ipratropium-albuterol , midodrine, mouth rinse, polyethylene glycol, prochlorperazine 

## 2023-08-24 NOTE — Progress Notes (Signed)
   08/24/23 2307  BiPAP/CPAP/SIPAP  BiPAP/CPAP/SIPAP Pt Type Adult  BiPAP/CPAP/SIPAP Resmed  Mask Type Full face mask  Dentures removed? Not applicable  Mask Size Medium  Flow Rate 1 lpm  Patient Home Machine No  Patient Home Mask No  Patient Home Tubing No  Auto Titrate Yes  Minimum cmH2O 4 cmH2O  Maximum cmH2O 16 cmH2O  Device Plugged into RED Power Outlet Yes

## 2023-08-24 NOTE — Progress Notes (Signed)
 PROGRESS NOTE   Jennifer [name]  ZOX:[id number] DOB: 08-15-1990 DOA: 08/15/2023 PCP: Calton Catholic, PA-C   Date of Service: the patient was seen and examined on 08/24/2023  Brief Narrative:  33 year old female history of paroxysmal A-fib, astrocytoma, chronic systolic CHF with recovered EF, history of prolonged QT interval, lipoprotein deficiency, familial hypertriglyceridemia , NASH, stage 3b CKD, class I obesity, type 2 diabetes, hypertension hypothyroidism, chronic pancreatitis, gastroparesis, peripheral neuropathy, history of pancytopenia, GERD, PCOS presented to the ED with severe abdominal pain nausea dry heaves.    Patient presented in the emergency department with severe hypertriglyceridemia is initially admitted to the stepdown unit and placed on insulin  infusion due to concerns for hypertriglyceridemia induced pancreatitis.  Patient developed acute cardiogenic volume overload for which cardiology was consulted and patient was placed on intravenous Lasix  every 8 hours.   Hospitalization additionally complicated by AKI superimposed on CKD 4 and worsening hyponatremia prompting nephrology consultation.     Assessment & Plan Acute on chronic pancreatitis (HCC) Improved Thought to be secondary to acute on chronic pancreatitis secondary to severe hypertriglyceridemia Aggressively managing associated hypertriglyceridemia with fenofibrate  and Crestor  As needed opiate-based analgesics for bouts of abdominal pain. Familial hypertriglyceridemia Aggressive management of hypertriglyceridemia with fenofibrate  and Crestor  Acute renal failure superimposed on stage 3b chronic kidney disease [facility]) Nephrology following, their input is appreciated Baseline creatinine of 1.1-2.3 Creatinine continuing to slightly downtrend, currently 2.89  Strict input and output monitoring, Hyponatremia Nephrology assisting with management of this as well, their input is appreciated Patient was previously on  hypertonic saline which is since been discontinued on 5/26 Currently on intravenous Lasix  for hypervolemic hyponatremia, plan to transition patient to oral diuretic on 5/30 Type 2 diabetes mellitus with hyperlipidemia (HCC) Patient now transitioned to basal bolus insulin  therapy Bouts of hypoglycemia early this morning, reducing Semglee  to 40 units twice daily Accu-Cheks before every meal and nightly with sliding scale insulin  Acute on chronic systolic CHF (congestive heart failure) (HCC) On intravenous Lasix , cardiology and nephrology following Near euvolemia, substantial improvement in peripheral edema Cardiology transitioning to oral diuretic therapy on 5/30 Strict input and output monitoring Hypothyroidism Continue levothyroxine  Essential hypertension Continue amlodipine , Coreg  Blood pressures are essentially at target Gastroparesis due to DM [facility]) Continue scheduled metoclopramide  Coronary artery disease involving native coronary artery of native heart without angina pectoris Chest pain-free Continue aspirin , lipid-lowering therapy, beta-blocker    Subjective:  Patient complaining of lightheadedness.  Patient denies abdominal pain.  Patient denies nausea or vomiting.  Physical Exam:  Vitals:   08/23/23 1023 08/23/23 1945 08/24/23 0322 08/24/23 0500  BP:  (!) 135/59 (!) 156/77   Pulse:  82 87   Resp:  20 17   Temp:  98.6 F (37 C) 98.4 F (36.9 C)   TempSrc:  Oral Oral   SpO2:  99% 98%   Weight: 72.8 kg   71.1 kg  Height: 4\' 9"  (1.448 m)       Constitutional: Awake alert and oriented x3, no associated distress.   Skin: no rashes, no lesions, good skin turgor noted. Eyes: Right-sided ptosis, pupils equally reactive to light, no evidence of scleral icterus or conjunctival pallor.  ENMT: Moist mucous membranes noted.  Posterior pharynx clear of any exudate or lesions.   Respiratory: clear to auscultation bilaterally, no wheezing, no crackles. Normal respiratory  effort. No accessory muscle use.  Cardiovascular: Regular rate and rhythm, no murmurs / rubs / gallops.  Distal bilateral lower extremity pitting edema noted.  2+ pedal pulses. No  carotid bruits.  Abdomen: Abdomen is soft and nontender.  No evidence of intra-abdominal masses.  Positive bowel sounds noted in all quadrants.   Musculoskeletal: Limited range of motion of the left upper extremity with associated contracture.  Poor muscle tone.    Data Reviewed:  I have personally reviewed and interpreted labs, imaging.  Significant findings are   CBC: Recent Labs  Lab 08/19/23 0744 08/20/23 0551 08/21/23 0449 08/22/23 0330 08/23/23 0421  WBC 7.5 7.7 7.0 6.6 7.0  HGB 9.7* 9.0* 9.1* 8.7* 8.6*  HCT 30.5* 27.6* 28.6* 27.7* 28.1*  MCV 96.5 94.5 96.3 98.9 99.6  PLT 99* 96* 104* 102* 126*   Basic Metabolic Panel: Recent Labs  Lab 08/18/23 0322 08/19/23 0744 08/20/23 0551 08/20/23 2147 08/21/23 0044 08/21/23 0619 08/21/23 0859 08/21/23 1248 08/22/23 0330 08/23/23 0420 08/23/23 0844 08/24/23 0417  NA 131* 128*   < > 123*   < > 125* 127* 128* 132* 135  --  134*  K 3.8 3.8   < > 4.2  --  4.1  --   --  4.3 3.9  --  3.7  CL 97* 97*   < > 93*  --  95*  --   --  99 99  --  93*  CO2 22 19*   < > 19*  --  15*  --   --  20* 24  --  29  GLUCOSE 272* 78   < > 209*  --  182*  --   --  166* 84 67* 228*  BUN 45* 48*   < > 45*  --  46*  --   --  54* 57*  --  56*  CREATININE 3.25* 3.29*   < > 3.19*  --  2.95*  --   --  3.06* 3.01*  --  2.89*  CALCIUM  8.8* 8.8*   < > 8.0*  --  8.0*  --   --  8.7* 9.1  --  9.1  MG 2.2  --   --   --   --   --   --   --   --   --   --  2.3  PHOS  --  4.5  --   --   --   --   --   --  6.2* 5.7*  --   --    < > = values in this interval not displayed.   GFR: Estimated Creatinine Clearance: 22.8 mL/min (A) (by C-G formula based on SCr of 2.89 mg/dL (H)). Liver Function Tests: Recent Labs  Lab 08/18/23 0322 08/19/23 0744 08/20/23 0551 08/22/23 0330  08/23/23 0420  AST 56*  --  81*  --   --   ALT 81*  --  85*  --   --   ALKPHOS 89  --  77  --   --   BILITOT 0.5  --  0.8  --   --   PROT 7.1  --  7.0  --   --   ALBUMIN  3.7 3.7 3.6 4.1 4.0     Code Status:  Full code.  Code status decision has been confirmed with: patient    Severity of Illness:  The appropriate patient status for this patient is INPATIENT. Inpatient status is judged to be reasonable and necessary in order to provide the required intensity of service to ensure the patient's safety. The patient's presenting symptoms, physical exam findings, and initial radiographic and laboratory data in the context of  their chronic comorbidities is felt to place them at high risk for further clinical deterioration. Furthermore, it is not anticipated that the patient will be medically stable for discharge from the hospital within 2 midnights of admission.   * I certify that at the point of admission it is my clinical judgment that the patient will require inpatient hospital care spanning beyond 2 midnights from the point of admission due to high intensity of service, high risk for further deterioration and high frequency of surveillance required.*  Time spent:  44 minutes  Author:  True Fuss MD  08/24/2023 8:56 AM

## 2023-08-24 NOTE — Assessment & Plan Note (Signed)
 Continue amlodipine , Coreg  Blood pressures are essentially at target

## 2023-08-24 NOTE — Assessment & Plan Note (Signed)
 Chest pain-free Continue aspirin , lipid-lowering therapy, beta-blocker

## 2023-08-24 NOTE — Assessment & Plan Note (Addendum)
 Improved Thought to be secondary to acute on chronic pancreatitis secondary to severe hypertriglyceridemia Aggressively managing associated hypertriglyceridemia with fenofibrate  and Crestor  As needed opiate-based analgesics for bouts of abdominal pain.

## 2023-08-25 DIAGNOSIS — K859 Acute pancreatitis without necrosis or infection, unspecified: Secondary | ICD-10-CM | POA: Diagnosis not present

## 2023-08-25 DIAGNOSIS — I5021 Acute systolic (congestive) heart failure: Secondary | ICD-10-CM | POA: Diagnosis not present

## 2023-08-25 DIAGNOSIS — I2510 Atherosclerotic heart disease of native coronary artery without angina pectoris: Secondary | ICD-10-CM | POA: Diagnosis not present

## 2023-08-25 DIAGNOSIS — N179 Acute kidney failure, unspecified: Secondary | ICD-10-CM | POA: Diagnosis not present

## 2023-08-25 LAB — BASIC METABOLIC PANEL WITH GFR
Anion gap: 13 (ref 5–15)
BUN: 60 mg/dL — ABNORMAL HIGH (ref 6–20)
CO2: 32 mmol/L (ref 22–32)
Calcium: 9.9 mg/dL (ref 8.9–10.3)
Chloride: 90 mmol/L — ABNORMAL LOW (ref 98–111)
Creatinine, Ser: 2.47 mg/dL — ABNORMAL HIGH (ref 0.44–1.00)
GFR, Estimated: 26 mL/min — ABNORMAL LOW (ref >60)
Glucose, Bld: 232 mg/dL — ABNORMAL HIGH (ref 70–99)
Potassium: 3.7 mmol/L (ref 3.5–5.1)
Sodium: 135 mmol/L (ref 135–145)

## 2023-08-25 LAB — GLUCOSE, CAPILLARY
Glucose-Capillary: 232 mg/dL — ABNORMAL HIGH (ref 70–99)
Glucose-Capillary: 195 mg/dL — ABNORMAL HIGH (ref 70–99)
Glucose-Capillary: 168 mg/dL — ABNORMAL HIGH (ref 70–99)
Glucose-Capillary: 170 mg/dL — ABNORMAL HIGH (ref 70–99)
Glucose-Capillary: 190 mg/dL — ABNORMAL HIGH (ref 70–99)
Glucose-Capillary: 190 mg/dL — ABNORMAL HIGH (ref 70–99)

## 2023-08-25 MED ORDER — OXYCODONE HCL 5 MG PO TABS
5.0000 mg | ORAL_TABLET | Freq: Three times a day (TID) | ORAL | Status: DC | PRN
  Administered 2023-08-26 (×2): 5 mg via ORAL
  Filled 2023-08-25 (×3): qty 1

## 2023-08-25 MED ORDER — HYDROMORPHONE HCL 1 MG/ML IJ SOLN
0.5000 mg | Freq: Once | INTRAMUSCULAR | Status: AC
  Administered 2023-08-25: 0.5 mg via INTRAVENOUS
  Filled 2023-08-25: qty 0.5

## 2023-08-25 MED ORDER — MECLIZINE HCL 25 MG PO TABS
25.0000 mg | ORAL_TABLET | Freq: Once | ORAL | Status: AC
  Administered 2023-08-25: 25 mg via ORAL
  Filled 2023-08-25: qty 1

## 2023-08-25 NOTE — Assessment & Plan Note (Signed)
 Patient now transitioned to basal bolus insulin  therapy Bouts of hypoglycemia early this morning, reducing Semglee  to 40 units twice daily Accu-Cheks before every meal and nightly with sliding scale insulin 

## 2023-08-25 NOTE — Progress Notes (Signed)
 Physical Therapy Treatment Patient Details Name: Ninoshka Wainwright MRN: [id number] DOB: [birth year] Today's Date: 08/25/2023   History of Present Illness Patient is a 33 year old female who presented on 5/20 with nausea, abdominal pain and dry heaving. Patient was admitted with hypertriglyceridemia, acute on chronic pancreatitis, and non ischemic cardiomyopathy with mildly reduced EF. PMH: gastroparesis, DM II hyperglycemia, HTN, acute renail failure, CKD IV, hyponatremia, CAD. Obesity, hypothyroidism. L foot drop,    PT Comments  Pt received in bed, able to transition to edge of bed with Supervision. 100% at rest on 1L O2. Pt able to transfer sit<>stand and side step bed to chair with CGA/Supervision without AD. Gait training in hall with light hand held assist and CGA for safety. No LOB, however does have chronic Left foot drop, brace is at home therefore anticipate gait safety would improve with brace donned. Slight lightheadedness while ambulating, SpO2 95% on RA upon returning to room. Educated pt on proper stair negotiation in the presence of L LE weakness, pt voiced good understanding. She currently lives in second floor apt. With a friend close by to assist her with needs. Pt states she does not want to go to STR and needs to get home to her dogs. Pt also states she is not concerned about negotiating apt. Stairs. If pt returns home, it would be beneficial to receive HHPT due to current weakness and decreased functional mobility.    If plan is discharge home, recommend the following: A little help with walking and/or transfers;A little help with bathing/dressing/bathroom;Assistance with cooking/housework;Assist for transportation;Help with stairs or ramp for entrance   Can travel by private vehicle     Yes  Equipment Recommendations  Other (comment) (? Youth RW if needed vs SPC pt has at home)    Recommendations for Other Services       Precautions / Restrictions Precautions Precautions:  Fall Recall of Precautions/Restrictions: Intact Precaution/Restrictions Comments: monitor BP Restrictions Weight Bearing Restrictions Per Provider Order: No     Mobility  Bed Mobility Overal bed mobility: Needs Assistance Bed Mobility: Supine to Sit     Supine to sit: Supervision     General bed mobility comments: Improved ability to transfer self in and out of bed    Transfers Overall transfer level: Needs assistance Equipment used: None Transfers: Sit to/from Stand, Bed to chair/wheelchair/BSC Sit to Stand: Contact guard assist           General transfer comment: CGA due to L LE weakness and lack of ankle brace her at hospital    Ambulation/Gait Ambulation/Gait assistance: Contact guard assist, Min assist Gait Distance (Feet): 95 Feet Assistive device: 1 person hand held assist Gait Pattern/deviations: Step-through pattern, Drifts right/left, Decreased dorsiflexion - left Gait velocity: decreased     General Gait Details: Pt ambulated in hallway with light hand held assist and CG for safety. No LOB noted, c/o slight dizziness   Stairs             Wheelchair Mobility     Tilt Bed    Modified Rankin (Stroke Patients Only)       Balance Overall balance assessment: Needs assistance Sitting-balance support: Feet supported Sitting balance-Leahy Scale: Good     Standing balance support: Single extremity supported, During functional activity Standing balance-Leahy Scale: Fair                              Communication Communication Communication: No apparent  difficulties  Cognition Arousal: Alert Behavior During Therapy: Flat affect   PT - Cognitive impairments: No apparent impairments                         Following commands: Intact      Cueing Cueing Techniques: Verbal cues  Exercises Other Exercises Other Exercises: Pt educated on role of PT and benefits of OOB activity. Reviewed PLB technique and discussed  home set up and prior LOF at length    General Comments General comments (skin integrity, edema, etc.): 100% on 1L O2 at rest, Pt placed on RA for gait training in hall, SpO2 95% post exertion      Pertinent Vitals/Pain Pain Assessment Pain Assessment: No/denies pain    Home Living                          Prior Function            PT Goals (current goals can now be found in the care plan section) Acute Rehab PT Goals Patient Stated Goal: to go home Progress towards PT goals: Progressing toward goals    Frequency    Min 3X/week      PT Plan      Co-evaluation              AM-PAC PT "6 Clicks" Mobility   Outcome Measure  Help needed turning from your back to your side while in a flat bed without using bedrails?: A Little Help needed moving from lying on your back to sitting on the side of a flat bed without using bedrails?: A Little Help needed moving to and from a bed to a chair (including a wheelchair)?: A Little Help needed standing up from a chair using your arms (e.g., wheelchair or bedside chair)?: A Little Help needed to walk in hospital room?: A Little Help needed climbing 3-5 steps with a railing? : A Lot 6 Click Score: 17    End of Session Equipment Utilized During Treatment: Gait belt Activity Tolerance: Patient tolerated treatment well Patient left: with call bell/phone within reach;in chair Nurse Communication: Mobility status PT Visit Diagnosis: Unsteadiness on feet (R26.81);Other abnormalities of gait and mobility (R26.89);Muscle weakness (generalized) (M62.81);Difficulty in walking, not elsewhere classified (R26.2)     Time: 9147-8295 PT Time Calculation (min) (ACUTE ONLY): 24 min  Charges:    $Gait Training: 8-22 mins $Therapeutic Activity: 8-22 mins PT General Charges $$ ACUTE PT VISIT: 1 Visit                    Melvyn Stagers, PTA  Diona Franklin 08/25/2023, 1:49 PM

## 2023-08-25 NOTE — Progress Notes (Signed)
 PROGRESS NOTE   Jennifer [name]  BJY:[id number] DOB: 03-22-1991 DOA: 08/15/2023 PCP: Calton Catholic, PA-C   Date of Service: the patient was seen and examined on 08/25/2023  Brief Narrative:  33 year old female history of paroxysmal A-fib, astrocytoma, chronic systolic CHF with recovered EF, history of prolonged QT interval, lipoprotein deficiency, familial hypertriglyceridemia , NASH, stage 3b CKD, class I obesity, type 2 diabetes, hypertension hypothyroidism, chronic pancreatitis, gastroparesis, peripheral neuropathy, history of pancytopenia, GERD, PCOS presented to the ED with severe abdominal pain nausea dry heaves.    Patient presented in the emergency department with severe hypertriglyceridemia is initially admitted to the stepdown unit and placed on insulin  infusion due to concerns for hypertriglyceridemia induced pancreatitis.  Patient developed acute cardiogenic volume overload for which cardiology was consulted and patient was placed on intravenous Lasix  every 8 hours.   Hospitalization additionally complicated by AKI superimposed on CKD 4 and worsening hyponatremia prompting nephrology consultation.     Assessment & Plan Acute on chronic pancreatitis (HCC) Improved Thought to be secondary to acute on chronic pancreatitis secondary to severe hypertriglyceridemia Aggressively managing associated hypertriglyceridemia with fenofibrate  and Crestor  As needed opiate-based analgesics for bouts of abdominal pain. Familial hypertriglyceridemia Aggressive management of hypertriglyceridemia with fenofibrate  and Crestor  Acute renal failure superimposed on stage 3b chronic kidney disease [facility]) Nephrology following, their input is appreciated Wide ranging baseline creatinine of 1.1-2.3 Creatinine continuing to slightly downtrend, currently 2.47 Strict input and output monitoring, Hyponatremia Nephrology assisting with management of this as well, their input is appreciated Patient was  previously on hypertonic saline which is since been discontinued on 5/26 Patient has now been transitioned from intravenous Lasix  to torsemide  today  Type 2 diabetes mellitus with hyperlipidemia (HCC) Patient now transitioned to basal bolus insulin  therapy Bouts of hypoglycemia early this morning, reducing Semglee  to 40 units twice daily Accu-Cheks before every meal and nightly with sliding scale insulin  Acute on chronic systolic CHF (congestive heart failure) [facility]) Cardiology recommending transition to oral torsemide  today. Near euvolemia, substantial improvement in peripheral edema Strict input and output monitoring Hypothyroidism Continue levothyroxine  Essential hypertension Continue amlodipine , Coreg  Blood pressures are essentially at target Gastroparesis due to DM [facility]) Continue scheduled metoclopramide  Coronary artery disease involving native coronary artery of native heart without angina pectoris Chest pain-free Continue aspirin , lipid-lowering therapy, beta-blocker    Subjective:  Patient complaining of continued "dizziness" patient denies abdominal pain nausea or vomiting today.    Physical Exam:  Vitals:   08/25/23 1250 08/25/23 1251 08/25/23 1702 08/25/23 2100  BP: 135/72 135/72  (!) 158/81  Pulse: 80 80  96  Resp:    16  Temp:    98.6 F (37 C)  TempSrc:    Oral  SpO2:   98% 93%  Weight:      Height:        Constitutional: Awake alert and oriented x3, no associated distress.   Skin: no rashes, no lesions, good skin turgor noted. Eyes: Right-sided ptosis, pupils equally reactive to light, no evidence of scleral icterus or conjunctival pallor.  ENMT: Moist mucous membranes noted.  Posterior pharynx clear of any exudate or lesions.   Respiratory: clear to auscultation bilaterally, no wheezing, no crackles. Normal respiratory effort. No accessory muscle use.  Cardiovascular: Regular rate and rhythm, no murmurs / rubs / gallops.  Distal bilateral lower extremity  pitting edema noted.  2+ pedal pulses. No carotid bruits.  Abdomen: Abdomen is soft and nontender.  No evidence of intra-abdominal masses.  Positive bowel sounds noted  in all quadrants.   Musculoskeletal: Limited range of motion of the left upper extremity with associated contracture.  Poor muscle tone.    Data Reviewed:  I have personally reviewed and interpreted labs, imaging.  Significant findings are   CBC: Recent Labs  Lab 08/19/23 0744 08/20/23 0551 08/21/23 0449 08/22/23 0330 08/23/23 0421  WBC 7.5 7.7 7.0 6.6 7.0  HGB 9.7* 9.0* 9.1* 8.7* 8.6*  HCT 30.5* 27.6* 28.6* 27.7* 28.1*  MCV 96.5 94.5 96.3 98.9 99.6  PLT 99* 96* 104* 102* 126*   Basic Metabolic Panel: Recent Labs  Lab 08/19/23 0744 08/20/23 0551 08/21/23 0619 08/21/23 0859 08/21/23 1248 08/22/23 0330 08/23/23 0420 08/23/23 0844 08/24/23 0417 08/25/23 0353  NA 128*   < > 125*   < > 128* 132* 135  --  134* 135  K 3.8   < > 4.1  --   --  4.3 3.9  --  3.7 3.7  CL 97*   < > 95*  --   --  99 99  --  93* 90*  CO2 19*   < > 15*  --   --  20* 24  --  29 32  GLUCOSE 78   < > 182*  --   --  166* 84 67* 228* 232*  BUN 48*   < > 46*  --   --  54* 57*  --  56* 60*  CREATININE 3.29*   < > 2.95*  --   --  3.06* 3.01*  --  2.89* 2.47*  CALCIUM  8.8*   < > 8.0*  --   --  8.7* 9.1  --  9.1 9.9  MG  --   --   --   --   --   --   --   --  2.3  --   PHOS 4.5  --   --   --   --  6.2* 5.7*  --   --   --    < > = values in this interval not displayed.   GFR: Estimated Creatinine Clearance: 25.7 mL/min (A) (by C-G formula based on SCr of 2.47 mg/dL (H)). Liver Function Tests: Recent Labs  Lab 08/19/23 0744 08/20/23 0551 08/22/23 0330 08/23/23 0420  AST  --  81*  --   --   ALT  --  85*  --   --   ALKPHOS  --  77  --   --   BILITOT  --  0.8  --   --   PROT  --  7.0  --   --   ALBUMIN  3.7 3.6 4.1 4.0     Code Status:  Full code.  Code status decision has been confirmed with: patient    Severity of  Illness:  The appropriate patient status for this patient is INPATIENT. Inpatient status is judged to be reasonable and necessary in order to provide the required intensity of service to ensure the patient's safety. The patient's presenting symptoms, physical exam findings, and initial radiographic and laboratory data in the context of their chronic comorbidities is felt to place them at high risk for further clinical deterioration. Furthermore, it is not anticipated that the patient will be medically stable for discharge from the hospital within 2 midnights of admission.   * I certify that at the point of admission it is my clinical judgment that the patient will require inpatient hospital care spanning beyond 2 midnights from the point of admission due to  high intensity of service, high risk for further deterioration and high frequency of surveillance required.*  Time spent:  46 minutes  Author:  True Fuss MD  08/25/2023 10:18 PM

## 2023-08-25 NOTE — Assessment & Plan Note (Signed)
 Aggressive management of hypertriglyceridemia with fenofibrate  and Crestor 

## 2023-08-25 NOTE — Assessment & Plan Note (Signed)
 Continue levothyroxine 

## 2023-08-25 NOTE — Assessment & Plan Note (Signed)
 Chest pain-free Continue aspirin , lipid-lowering therapy, beta-blocker

## 2023-08-25 NOTE — Assessment & Plan Note (Signed)
 Continue amlodipine , Coreg  Blood pressures are essentially at target

## 2023-08-25 NOTE — Assessment & Plan Note (Signed)
 Improved Thought to be secondary to acute on chronic pancreatitis secondary to severe hypertriglyceridemia Aggressively managing associated hypertriglyceridemia with fenofibrate  and Crestor  As needed opiate-based analgesics for bouts of abdominal pain.

## 2023-08-25 NOTE — Plan of Care (Signed)

## 2023-08-25 NOTE — Assessment & Plan Note (Signed)
 Cardiology recommending transition to oral torsemide  today. Near euvolemia, substantial improvement in peripheral edema Strict input and output monitoring

## 2023-08-25 NOTE — Progress Notes (Addendum)
 [facility] Progress Note  Subjective:  UOP 6000 cc yesterday Creat down to 2.4 today Pt feeling better, no c/o's  Vitals:   08/24/23 1847 08/24/23 2101 08/25/23 0308 08/25/23 0324  BP: (!) 156/84 (!) 166/95 (!) 168/90   Pulse: 89 81 95   Resp: 18 18 20    Temp:  99.1 F (37.3 C) 99.2 F (37.3 C)   TempSrc:  Oral Oral   SpO2: 100% 100% 95%   Weight:    66.6 kg  Height:        Exam: General:  adult female, alert today Eyes: periorbital edema mostly resolved Heart: S1S2 no rub Lungs: clear to auscultation  Abdomen: distended, chronic  Extremities: 1+ bilat LE edema, much better Neuro: alert, Ox 3 GU no foley    Renal-related home meds: Norvasc  5 every day Rocaltrol  0.25 mcg daily Coreg  9.375 mg bid Hydralazine  75 mg tid Sod bicarb 1300 bid Demadex  60mg  po daily Others: Creon , Topamax , Imitrex , Carafate , Crestor , Repatha , Compazine , Lyrica , PPI, MVI, Reglan , Synthroid , Corlanor , insulin , fenofibrate , Cymbalta , Fioricet , aspirin , Elavil   Date   Creat  eGFR (ml/min) 2019- 2020  0.66- 0.81 2021   1.10- 1.24 2022   0.95- 3.91 2023   1.06- 3.40 2024   1.04- 3.13 Jan- feb 2025  1.70- 2.92 21- 40 ml/min    Mar 2025  1.63- 1.78 April 2025  1.74- 2.34 28- 40 ml/min  08/07/23  2.18- 2.40 08/14/23  1.94 5/20   2.26 5/21   2.35 5/22   2.00 5/23   3.25 5/24   3.29 5/25   3.32  18 ml/min  08/21/23  2.95  21 ml/min  UA 08/18/23 - prot neg, 0-5 rbc / wbc UNa < 10, UCr 80 Renal US  - 8.7/ 9.9 cm kidneys w/ no hydro  Summary: Jennifer [name] is a 33 y.o. female with a history of astrocytoma, atrial fibrillation, chronic systolic congestive heart failure with recovered EF, NASH, and CKD stage IV who presented to the hospital with abdominal pain and nausea. She was found to have hypertriglyceridemia felt induced by pancreatitis.    Assessment/ Plan: AKI on CKD 3b: b/l creat 1.7- 2.3 from April 2025, eGFR 28-40 ml/min. F/b Dr Edson Graces at [facility]. Creat here was 1.9 on  admission in the setting of pancreatitis with assoc hypertriglyceridemia. Creat then rose up to 3.3 yest and down to 2.95 today. I/O were + 4 L. CXR 5/26 showed no edema. On exam pt was quite edematous x 4 extremities and wt's were up 6kg. UA negative, renal US  no obstruction and urine lytes were pre-renal. No nephrotoxins. AKI suspected due to decomp CHF/ vol overload. BP's were soft initially so Coreg  was dc'd, and high-dose IV lasix  started on 5/27. Very good diuresis over past 3-4 days. Wts down, MS better and edema sig improved. IV Lasix  completed yesterday, and will resume home torsemide  at 60mg  / day starting today. Creat down to 2.4, close to baseline. Pt knows to f/u w/ Dr Edson Graces. No further suggestions. Will sign off.  Hyponatremia: SP 3% saline (dc'd 5/26). Resolved now sp diuresis.  HTN: as above, bp's were soft and Coreg  dc'd. BP's now normal to slightly high. Per pcp.  Hypervolemia: abd swelling persists,  but UE/ LE edema much better. Lungs clear.  Acute/ chronic systolic heart failure: cardiology following.  Acute on chronic pancreatitis: w/ severe ^TG. Per pmd. DM type 2     Larry Poag MD  [facility] 08/25/2023, 6:49 AM  Recent Labs  Lab  08/22/23 0330 08/23/23 0420 08/23/23 0421 08/24/23 0417 08/25/23 0353  HGB 8.7*  --  8.6*  --   --   ALBUMIN  4.1 4.0  --   --   --   CALCIUM  8.7* 9.1  --  9.1 9.9  PHOS 6.2* 5.7*  --   --   --   CREATININE 3.06* 3.01*  --  2.89* 2.47*  K 4.3 3.9  --  3.7 3.7   No results for input(s): "IRON", "TIBC", "FERRITIN" in the last 168 hours. Inpatient medications:  aspirin  EC  81 mg Oral Daily   azelastine   2 spray Each Nare Q0600   calcitRIOL  0.25 mcg Oral Daily   Chlorhexidine  Gluconate Cloth  6 each Topical QHS   enoxaparin  (LOVENOX ) injection  30 mg Subcutaneous Q24H   famotidine   10 mg Oral Daily   fenofibrate   54 mg Oral Daily   insulin  aspart  0-9 Units Subcutaneous TID WC   insulin  glargine-yfgn  40 Units Subcutaneous BID    ivabradine   5 mg Oral BID WC   And   ivabradine   2.5 mg Oral BID WC   levothyroxine   150 mcg Oral Q0600   lipase/protease/amylase  36,000 Units Oral TID AC   loratadine   10 mg Oral QPM   metoCLOPramide   5 mg Oral TID AC & HS   pantoprazole   40 mg Oral Daily   polyethylene glycol  17 g Oral BID   pregabalin   150 mg Oral Daily   rosuvastatin   40 mg Oral Daily   senna-docusate  1 tablet Oral BID   sodium bicarbonate   325 mg Oral BID   torsemide   60 mg Oral Daily   Vitamin D  (Ergocalciferol )  50,000 Units Oral Q7 days     acetaminophen  **OR** acetaminophen , butalbital -acetaminophen -caffeine , camphor-menthol, diphenhydrAMINE , hydrALAZINE , HYDROmorphone  (DILAUDID ) injection, ipratropium-albuterol , midodrine, mouth rinse, polyethylene glycol, prochlorperazine 

## 2023-08-25 NOTE — Assessment & Plan Note (Signed)
 Nephrology following, their input is appreciated Wide ranging baseline creatinine of 1.1-2.3 Creatinine continuing to slightly downtrend, currently 2.47 Strict input and output monitoring,

## 2023-08-25 NOTE — Assessment & Plan Note (Signed)
 Continue scheduled metoclopramide 

## 2023-08-25 NOTE — Inpatient Diabetes Management (Addendum)
 Inpatient Diabetes Program Recommendations  AACE/ADA: New Consensus Statement on Inpatient Glycemic Control (2015)  Target Ranges:  Prepandial:   less than 140 mg/dL      Peak postprandial:   less than 180 mg/dL (1-2 hours)      Critically ill patients:  140 - 180 mg/dL    Latest Reference Range & Units 08/24/23 07:51 08/24/23 11:29 08/24/23 16:39 08/24/23 20:59  Glucose-Capillary 70 - 99 mg/dL 161 (H)  3 units Novolog   225 (H)  3 units Novolog   40 units Semglee   236 (H)  3 units Novolog   283 (H)  3 units Novolog   40 units Semglee    (H): Data is abnormally high  Latest Reference Range & Units 08/25/23 07:43  Glucose-Capillary 70 - 99 mg/dL 096 (H)  (H): Data is abnormally high     Home DM Meds: U-500 Concentrated Insulin  300 units QAM/ 250 units at HS     Humalog  1 unit/2 g CHO and 5 units per 50 mg/dl correction before meals   Current Orders: Semglee  40 units BID     Novolog  Sensitive Correction Scale/ SSI (0-9 units) TID AC + HS    MD- Note CBGs remain elevated Please consider:  1. Increase Semglee  to 42 units BID  2. Start Novolog  Meal Coverage: Novolog  4 units TID with meals HOLD if pt NPO HOLD if pt eats <50% meals  Have pt restart her home doses of Insulin  when she discharges home Has ENDO: [facility] Last Seen 07/31/2023    --Will follow patient during hospitalization--  Langston Pippins RN, MSN, CDCES Diabetes Coordinator Inpatient Glycemic Control Team Team Pager: [phone] (8a-5p)

## 2023-08-25 NOTE — Assessment & Plan Note (Signed)
 Nephrology assisting with management of this as well, their input is appreciated Patient was previously on hypertonic saline which is since been discontinued on 5/26 Patient has now been transitioned from intravenous Lasix  to torsemide  today

## 2023-08-25 NOTE — Progress Notes (Signed)
 Occupational Therapy Treatment Patient Details Name: Jennifer [name] MRN: [id number] DOB: [birth year] Today's Date: 08/25/2023   History of present illness Patient is a 33 year old female who presented on 5/20 with nausea, abdominal pain and dry heaving. Patient was admitted with hypertriglyceridemia, acute on chronic pancreatitis, and non ischemic cardiomyopathy with mildly reduced EF. PMH: gastroparesis, DM II hyperglycemia, HTN, acute renail failure, CKD IV, hyponatremia, CAD. Obesity, hypothyroidism. L foot drop,   OT comments  Pt making progress towards goals, limited by 5/10 abdominal pain and c/o lightheadedness. Pt completes bed mobility supervision level, sits EOB to perform hair care tasks, and functional transfers + mobility t/f sink and bathroom door with CGA for safety. RN and MD notified of ongoing symptoms, BP assessed as listed in flowsheet. Discharge recommendation remains appropriate, patient will benefit from continued inpatient follow up therapy, <3 hours/day       If plan is discharge home, recommend the following:  A lot of help with walking and/or transfers;A lot of help with bathing/dressing/bathroom;Assistance with cooking/housework;Help with stairs or ramp for entrance;Direct supervision/assist for financial management;Supervision due to cognitive status;Direct supervision/assist for medications management   Equipment Recommendations  None recommended by OT       Precautions / Restrictions Precautions Precautions: Fall Recall of Precautions/Restrictions: Intact Precaution/Restrictions Comments: monitor BP, foot drop Restrictions Weight Bearing Restrictions Per Provider Order: No       Mobility Bed Mobility Overal bed mobility: Needs Assistance Bed Mobility: Supine to Sit     Supine to sit: Supervision          Transfers Overall transfer level: Needs assistance Equipment used: None   Sit to Stand: Contact guard assist           General  transfer comment: CGA due to L LE weakness and lack of ankle brace her at hospital     Balance Overall balance assessment: Needs assistance Sitting-balance support: Feet supported Sitting balance-Leahy Scale: Good     Standing balance support: Single extremity supported, During functional activity Standing balance-Leahy Scale: Fair                             ADL either performed or assessed with clinical judgement   ADL Overall ADL's : Needs assistance/impaired     Grooming: Wash/dry face;Standing;Brushing hair Grooming Details (indicate cue type and reason): CGA for safety, completed brushing hair seated and stands at sink for face washing tasks                             Functional mobility during ADLs: Contact guard assist General ADL Comments: HHA t/f sink and bathroom door to put used wash cloth in linen bag. pt endorses feelings of lightheadedness,often with delayed response time and keeping eyes clsoed    Extremity/Trunk Assessment Upper Extremity Assessment LUE Deficits / Details: patient holding LUE in 90 degree elbow flexion with no attmepts to use UE with movements patietn noted to have edema on this side as well. unable to further test this with patient having significant orthostatic during session. LUE Coordination: decreased fine motor;decreased gross motor                     Communication Communication Communication: No apparent difficulties   Cognition Arousal: Lethargic Behavior During Therapy: Flat affect               OT - Cognition Comments:  flat affect, often appearing sleepy and lethargic. plesant throughout                 Following commands: Intact        Cueing   Cueing Techniques: Verbal cues        General Comments Message sent to RN and MD regarding pt status and BP supine 163/73 (95) HR 84 SpO2 97% on 1L    Pertinent Vitals/ Pain       Pain Assessment Pain Assessment: 0-10 Pain Score: 5           Frequency  Min 2X/week        Progress Toward Goals  OT Goals(current goals can now be found in the care plan section)  Progress towards OT goals: Progressing toward goals  Acute Rehab OT Goals OT Goal Formulation: With patient Time For Goal Achievement: 09/04/23 Potential to Achieve Goals: Fair  Plan      Co-evaluation    PT/OT/SLP Co-Evaluation/Treatment: Yes Reason for Co-Treatment: To address functional/ADL transfers;For patient/therapist safety PT goals addressed during session: Mobility/safety with mobility OT goals addressed during session: ADL's and self-care      AM-PAC OT "6 Clicks" Daily Activity     Outcome Measure   Help from another person eating meals?: A Little Help from another person taking care of personal grooming?: A Little Help from another person toileting, which includes using toliet, bedpan, or urinal?: A Little Help from another person bathing (including washing, rinsing, drying)?: A Little Help from another person to put on and taking off regular upper body clothing?: A Little Help from another person to put on and taking off regular lower body clothing?: A Little 6 Click Score: 18    End of Session Equipment Utilized During Treatment: Oxygen  OT Visit Diagnosis: Unsteadiness on feet (R26.81);Other abnormalities of gait and mobility (R26.89);Muscle weakness (generalized) (M62.81)   Activity Tolerance Patient tolerated treatment well   Patient Left in bed;with bed alarm set;with call bell/phone within reach   Nurse Communication Mobility status;Other (comment) (vitals)        Time: 1610-9604 OT Time Calculation (min): 26 min  Charges: OT Treatments $Self Care/Home Management : 8-22 mins  Adelee Hannula L. Amelia Macken, OTR/L  08/25/23, 5:10 PM

## 2023-08-26 ENCOUNTER — Other Ambulatory Visit (HOSPITAL_COMMUNITY): Payer: Self-pay

## 2023-08-26 DIAGNOSIS — Z794 Long term (current) use of insulin: Secondary | ICD-10-CM

## 2023-08-26 DIAGNOSIS — K859 Acute pancreatitis without necrosis or infection, unspecified: Secondary | ICD-10-CM | POA: Diagnosis not present

## 2023-08-26 DIAGNOSIS — I5023 Acute on chronic systolic (congestive) heart failure: Secondary | ICD-10-CM | POA: Diagnosis not present

## 2023-08-26 DIAGNOSIS — E1165 Type 2 diabetes mellitus with hyperglycemia: Secondary | ICD-10-CM | POA: Diagnosis not present

## 2023-08-26 DIAGNOSIS — I10 Essential (primary) hypertension: Secondary | ICD-10-CM | POA: Diagnosis not present

## 2023-08-26 LAB — BASIC METABOLIC PANEL WITH GFR
Anion gap: 13 (ref 5–15)
BUN: 64 mg/dL — ABNORMAL HIGH (ref 6–20)
CO2: 30 mmol/L (ref 22–32)
Calcium: 9.7 mg/dL (ref 8.9–10.3)
Chloride: 90 mmol/L — ABNORMAL LOW (ref 98–111)
Creatinine, Ser: 2.53 mg/dL — ABNORMAL HIGH (ref 0.44–1.00)
GFR, Estimated: 25 mL/min — ABNORMAL LOW (ref >60)
Glucose, Bld: 269 mg/dL — ABNORMAL HIGH (ref 70–99)
Potassium: 4 mmol/L (ref 3.5–5.1)
Sodium: 133 mmol/L — ABNORMAL LOW (ref 135–145)

## 2023-08-26 LAB — RENAL FUNCTION PANEL
Albumin: 4 g/dL (ref 3.5–5.0)
Anion gap: 14 (ref 5–15)
BUN: 66 mg/dL — ABNORMAL HIGH (ref 6–20)
CO2: 29 mmol/L (ref 22–32)
Calcium: 9.6 mg/dL (ref 8.9–10.3)
Chloride: 89 mmol/L — ABNORMAL LOW (ref 98–111)
Creatinine, Ser: 2.51 mg/dL — ABNORMAL HIGH (ref 0.44–1.00)
GFR, Estimated: 25 mL/min — ABNORMAL LOW (ref >60)
Glucose, Bld: 261 mg/dL — ABNORMAL HIGH (ref 70–99)
Phosphorus: 4.8 mg/dL — ABNORMAL HIGH (ref 2.5–4.6)
Potassium: 4 mmol/L (ref 3.5–5.1)
Sodium: 132 mmol/L — ABNORMAL LOW (ref 135–145)

## 2023-08-26 LAB — GLUCOSE, CAPILLARY
Glucose-Capillary: 218 mg/dL — ABNORMAL HIGH (ref 70–99)
Glucose-Capillary: 304 mg/dL — ABNORMAL HIGH (ref 70–99)

## 2023-08-26 LAB — LIPASE, BLOOD: Lipase: 28 U/L (ref 11–51)

## 2023-08-26 LAB — MAGNESIUM: Magnesium: 2.4 mg/dL (ref 1.7–2.4)

## 2023-08-26 MED ORDER — LANCETS MISC
1.0000 | Freq: Three times a day (TID) | 0 refills | Status: AC
  Filled 2023-08-26: qty 100, fill #0

## 2023-08-26 MED ORDER — OXYCODONE HCL 5 MG PO TABS
5.0000 mg | ORAL_TABLET | Freq: Four times a day (QID) | ORAL | 0 refills | Status: DC | PRN
  Filled 2023-08-26 (×2): qty 20, 5d supply, fill #0

## 2023-08-26 MED ORDER — OXYCODONE HCL 5 MG PO TABS
5.0000 mg | ORAL_TABLET | Freq: Once | ORAL | Status: AC
  Administered 2023-08-26: 5 mg via ORAL
  Filled 2023-08-26: qty 1

## 2023-08-26 MED ORDER — LANCET DEVICE MISC
1.0000 | Freq: Three times a day (TID) | 0 refills | Status: AC
  Filled 2023-08-26: qty 1, fill #0

## 2023-08-26 MED ORDER — HYDROMORPHONE HCL 1 MG/ML IJ SOLN: 0.2500 mg | Freq: Once | INTRAMUSCULAR | Status: DC

## 2023-08-26 MED ORDER — PEN NEEDLES 31G X 5 MM MISC
1.0000 | Freq: Three times a day (TID) | 0 refills | Status: AC
  Filled 2023-08-26: qty 100, 33d supply, fill #0
  Filled 2023-08-26: qty 100, fill #0

## 2023-08-26 MED ORDER — INSULIN GLARGINE-YFGN 100 UNIT/ML ~~LOC~~ SOPN
44.0000 [IU] | PEN_INJECTOR | Freq: Two times a day (BID) | SUBCUTANEOUS | 2 refills | Status: DC
  Filled 2023-08-26 (×2): qty 15, 17d supply, fill #0

## 2023-08-26 MED ORDER — BLOOD GLUCOSE TEST VI STRP
1.0000 | ORAL_STRIP | Freq: Three times a day (TID) | 0 refills | Status: AC
  Filled 2023-08-26 (×2): qty 100, 34d supply, fill #0

## 2023-08-26 NOTE — Plan of Care (Signed)
  Problem: Education: Goal: Knowledge of General Education information will improve Description: Including pain rating scale, medication(s)/side effects and non-pharmacologic comfort measures 08/26/2023 0356 by Prudy Brownie, RN Outcome: Progressing 08/26/2023 0355 by Prudy Brownie, RN Outcome: Progressing   Problem: Health Behavior/Discharge Planning: Goal: Ability to manage health-related needs will improve 08/26/2023 0356 by Prudy Brownie, RN Outcome: Progressing 08/26/2023 0355 by Prudy Brownie, RN Outcome: Progressing   Problem: Clinical Measurements: Goal: Ability to maintain clinical measurements within normal limits will improve 08/26/2023 0356 by Prudy Brownie, RN Outcome: Progressing 08/26/2023 0355 by Prudy Brownie, RN Outcome: Progressing Goal: Will remain free from infection 08/26/2023 0356 by Prudy Brownie, RN Outcome: Progressing 08/26/2023 0355 by Prudy Brownie, RN Outcome: Progressing Goal: Diagnostic test results will improve Outcome: Progressing Goal: Respiratory complications will improve Outcome: Progressing Goal: Cardiovascular complication will be avoided Outcome: Progressing   Problem: Activity: Goal: Risk for activity intolerance will decrease Outcome: Progressing   Problem: Nutrition: Goal: Adequate nutrition will be maintained Outcome: Progressing   Problem: Coping: Goal: Level of anxiety will decrease Outcome: Progressing   Problem: Elimination: Goal: Will not experience complications related to bowel motility Outcome: Progressing Goal: Will not experience complications related to urinary retention Outcome: Progressing   Problem: Pain Managment: Goal: General experience of comfort will improve and/or be controlled Outcome: Progressing   Problem: Safety: Goal: Ability to remain free from injury will improve Outcome: Progressing   Problem: Skin Integrity: Goal: Risk for impaired skin integrity will  decrease Outcome: Progressing

## 2023-08-26 NOTE — Discharge Summary (Signed)
 Physician Discharge Summary   Patient: Jennifer [name] MRN: [id number] DOB: July 19, 1990  Admit date:     08/15/2023  Discharge date: 08/28/23  Discharge Physician: True Fuss   PCP: Calton Catholic, PA-C   Recommendations at discharge:   Please take all prescribed medications exactly as instructed including discontinuing your U-500 insulin  and instead using a Semglee  insulin  pen , injecting yourself with 44 units twice daily. Please check your blood sugars three times daily prior to meals. Please consume a low carbohydrate diet.  Consume small meals.  Avoid greasy food, spicy food or alcohol .  Please increase your physical activity as tolerated. Please maintain all outpatient follow-up appointments including follow-up with your primary care provider and your endocrinologist. Please return to the emergency department if you develop worsening abdominal pain, fevers in excess of 100.4 F, weakness or inability to tolerate oral intake. A referral has been made for you to receive home health physical therapy in the next few days.  You should be contacted to establish care in the coming days.   Discharge Diagnoses: Principal Problem:   Intractable abdominal pain Active Problems:   Acute on chronic systolic CHF (congestive heart failure) (HCC)   Familial hypertriglyceridemia   Acute renal failure superimposed on stage 3b chronic kidney disease (HCC)   Type 2 diabetes mellitus with hyperglycemia, with long-term current use of insulin  (HCC)   Hypothyroidism   Chronic systolic congestive heart failure (HCC)   Essential hypertension   Acute systolic congestive heart failure (HCC)   CKD (chronic kidney disease) stage 4, GFR 15-29 ml/min (HCC)   Pulmonary hypertension (HCC)   NASH (nonalcoholic steatohepatitis)   Obesity (BMI 30-39.9)   Hyponatremia   Acute on chronic pancreatitis (HCC)   Gastroparesis due to DM (HCC)   Hepatic steatosis   Coronary artery disease involving native  coronary artery of native heart without angina pectoris   Nocturnal hypoxemia   Chronic pancreatitis (HCC)   SOB (shortness of breath)   Dizziness  Resolved Problems:   * No resolved hospital problems. *   Hospital Course: 33 year old female history of paroxysmal A-fib, astrocytoma, chronic systolic CHF with recovered EF, history of prolonged QT interval, lipoprotein deficiency, familial hypertriglyceridemia , NASH, stage 3b CKD, class I obesity, type 2 diabetes, hypertension hypothyroidism, chronic pancreatitis, gastroparesis, peripheral neuropathy, history of pancytopenia, GERD, PCOS presented to the ED with severe abdominal pain nausea dry heaves.    Patient presented in the emergency department with severe hypertriglyceridemia is initially admitted to the stepdown unit and placed on insulin  infusion due to concerns for hypertriglyceridemia induced pancreatitis.  In the days that followed patient clinically improved and was transitioned off of insulin  infusion to subcutaneous insulin .  Concerning the patient's severe hypertriglyceridemia, patient was managed with a combination of fenofibrate  and Crestor .    As a result of intravenous volume resuscitation early in the hospitalization, patient developed acute cardiogenic volume overload for which cardiology was consulted and patient was placed on intravenous Lasix  every 8 hours. Hospitalization was additionally complicated by AKI superimposed on CKD 4 and worsening hyponatremia prompting nephrology consultation.    With a combination of intermittent hypertonic saline and diuretics sodium levels corrected and edema improved.  Patient was eventually transition from intravenous Lasix  to oral torsemide  and hypertonic saline was additionally discontinued.  By the time of discharge patient continued to experience some epigastric pain however this was felt to be chronic.  Patient was tolerating oral intake well and her activity level had dramatically  improved.  Patient was discharged home in improved and stable condition on 08/26/2023 with home health physical therapy.      Consultants: Dr. Zana Hesselbach with Nephrology, Dr. Alda Amas with Cardiology Procedures performed: none  Disposition: home with home health Diet recommendation:  Discharge Diet Orders (From admission, onward)     Start     Ordered   08/26/23 0000  Diet Carb Modified        08/26/23 1306           Cardiac and Carb modified diet  DISCHARGE MEDICATION: Allergies as of 08/26/2023       Reactions   Icosapent  Ethyl (epa Ethyl Ester) (fish) Hives   Ketamine Other (See Comments)   In vegetative state for 15 minutes per pt   Maitake Itching   Itchy throat   Morphine  Hives, Itching, Rash, Other (See Comments)   Sorethroat   Penicillins Hives, Itching, Rash   Has patient had a PCN reaction causing immediate rash, facial/tongue/throat swelling, SOB or lightheadedness with hypotension: Y Has patient had a PCN reaction causing severe rash involving mucus membranes or skin necrosis: Y Has patient had a PCN reaction that required hospitalization: N Has patient had a PCN reaction occurring within the last 10 years: Y   Mushroom Other (See Comments)   Pt has never had mushrooms but tested positive on allergy test   Shellfish Allergy Other (See Comments)   Pt has never had shellfish but tested positive on allergy test. Pt states contrast in CT is okay   Fentanyl  Nausea And Vomiting   Prednisone Rash        Medication List     STOP taking these medications    HumuLIN  R U-500 KwikPen 500 UNIT/ML KwikPen Generic drug: insulin  regular human CONCENTRATED   insulin  lispro 100 UNIT/ML KwikPen Commonly known as: HUMALOG        TAKE these medications    acetaminophen -codeine  300-30 MG tablet Commonly known as: TYLENOL  #3 Take 1 tablet by mouth every 6 (six) hours as needed for moderate pain (pain score 4-6) or severe pain (pain score 7-10).   amitriptyline  10 MG  tablet Commonly known as: ELAVIL  Take 10 mg by mouth at bedtime.   amLODipine  5 MG tablet Commonly known as: NORVASC  Take 1 tablet (5 mg total) by mouth daily.   aspirin  EC 81 MG tablet Take 1 tablet (81 mg total) by mouth daily. Swallow whole.   azelastine  0.1 % nasal spray Commonly known as: ASTELIN  Place 2 sprays into both nostrils daily at 6 (six) AM.   Baqsimi One Pack 3 MG/DOSE Powd Generic drug: Glucagon Place 1 Dose into the nose once as needed (hypoglycemia).   Blisovi  Fe 1.5/30 1.5-30 MG-MCG tablet Generic drug: norethindrone -ethinyl estradiol -iron Take 1 tablet by mouth daily.   BLOOD GLUCOSE TEST STRIPS Strp 1 each by Does not apply route 3 (three) times daily. Use as directed to check blood sugar. May dispense any manufacturer covered by patient's insurance and fits patient's device.   butalbital -acetaminophen -caffeine  50-325-40 MG tablet Commonly known as: FIORICET  Take 1 tablet by mouth daily as needed for headache.   calcitRIOL  0.25 MCG capsule Commonly known as: ROCALTROL  Take 0.25 mcg by mouth daily.   carvedilol  6.25 MG tablet Commonly known as: COREG  Take 1.5 tablets (9.375 mg total) by mouth 2 (two) times daily.   CRANBERRY CONCENTRATE PO Take 1 tablet by mouth daily at 12 noon.   Creon  36000-114000 units Cpep capsule Generic drug: lipase/protease/amylase Take 1 capsule (36,000 Units total) by mouth  3 (three) times daily.   Dexcom G7 Sensor Misc Inject 1 Device into the skin See admin instructions. Change device every ten days   DULoxetine  60 MG capsule Commonly known as: CYMBALTA  Take 60 mg by mouth at bedtime.   ergocalciferol  1.25 MG (50000 UT) capsule Commonly known as: VITAMIN D2 Take 50,000 Units by mouth every 7 (seven) days. Every Sunday   fenofibrate  54 MG tablet Take 1 tablet (54 mg total) by mouth daily. What changed: when to take this   hydrALAZINE  50 MG tablet Commonly known as: APRESOLINE  Take 1.5 tablets (75 mg total)  by mouth 3 (three) times daily.   insulin  glargine-yfgn 100 UNIT/ML Pen Commonly known as: SEMGLEE  Inject 44 Units into the skin 2 (two) times daily. May substitute as needed per insurance.   ivabradine  7.5 MG Tabs tablet Commonly known as: CORLANOR  Take 1 tablet (7.5 mg total) by mouth 2 (two) times daily with a meal. What changed: when to take this   Lancet Device Misc 1 each by Does not apply route 3 (three) times daily. May dispense any manufacturer covered by patient's insurance.   Lancets Misc 1 each by Does not apply route 3 (three) times daily. Use as directed to check blood sugar. May dispense any manufacturer covered by patient's insurance and fits patient's device.   levocetirizine 5 MG tablet Commonly known as: XYZAL  Take 5 mg by mouth every evening.   levothyroxine  150 MCG tablet Commonly known as: SYNTHROID  Take 150 mcg by mouth daily.   metoCLOPramide  5 MG tablet Commonly known as: Reglan  Take 1 tablet (5 mg total) by mouth every 8 (eight) hours as needed for nausea or vomiting. What changed: when to take this   multivitamin with minerals Tabs tablet Take 1 tablet by mouth daily.   oxyCODONE  5 MG immediate release tablet Commonly known as: Oxy IR/ROXICODONE  Take 1 tablet (5 mg total) by mouth every 6 (six) hours as needed for severe pain (pain score 7-10).   pantoprazole  40 MG tablet Commonly known as: PROTONIX  Take 40 mg by mouth daily.   Pen Needles 31G X 5 MM Misc Use as directed 3 (three) times daily.   polyethylene glycol 17 g packet Commonly known as: MIRALAX  / GLYCOLAX  Take 17 g by mouth daily as needed for mild constipation. Also available OTC   pregabalin  300 MG capsule Commonly known as: LYRICA  Take 300 mg by mouth 2 (two) times daily.   prochlorperazine  10 MG tablet Commonly known as: COMPAZINE  Take 10 mg by mouth at bedtime. Preventative   promethazine  25 MG tablet Commonly known as: PHENERGAN  Take 1 tablet (25 mg total) by mouth  every 6 (six) hours as needed for nausea or vomiting.   Repatha  SureClick 140 MG/ML Soaj Generic drug: Evolocumab  Inject 140 mg into the skin every 14 (fourteen) days.   rosuvastatin  40 MG tablet Commonly known as: CRESTOR  Take 40 mg by mouth daily.   Safety Seal Miscellaneous Misc Hormonic Hair Solution with minoxidil USP 7% and finasteride USP 0.05% - apply to affected areas daily every morning. What changed:  how much to take how to take this when to take this   sodium bicarbonate  650 MG tablet Take 2 tablets (1,300 mg total) by mouth 2 (two) times daily.   sucralfate  1 g tablet Commonly known as: Carafate  Take 1 tablet (1 g total) by mouth with breakfast, with lunch, and with evening meal for 7 days. What changed: when to take this   SUMAtriptan  100 MG tablet  Commonly known as: IMITREX  Take 100 mg by mouth every 2 (two) hours as needed for migraine or headache.   topiramate  50 MG tablet Commonly known as: TOPAMAX  Take 50 mg by mouth 2 (two) times daily.   torsemide  20 MG tablet Commonly known as: DEMADEX  Take 3 tablets (60 mg total) by mouth daily.         Discharge Exam: Filed Weights   08/23/23 1023 08/24/23 0500 08/25/23 0324  Weight: 72.8 kg 71.1 kg 66.6 kg    Constitutional: Awake alert and oriented x3, no associated distress.   Respiratory: clear to auscultation bilaterally, no wheezing, no crackles. Normal respiratory effort. No accessory muscle use.  Cardiovascular: Regular rate and rhythm, no murmurs / rubs / gallops. No extremity edema. 2+ pedal pulses. No carotid bruits.  Abdomen: Some notable epigastric tenderness.  Abdomen is soft..  No evidence of intra-abdominal masses.  Positive bowel sounds noted in all quadrants.   Musculoskeletal: Left upper extremity contractures.     Condition at discharge: fair  The results of significant diagnostics from this hospitalization (including imaging, microbiology, ancillary and laboratory) are listed below  for reference.   Imaging Studies: DG CHEST PORT 1 VIEW Result Date: 08/20/2023 CLINICAL DATA:  141880 SOB (shortness of breath) [id number] EXAM: PORTABLE CHEST 1 VIEW COMPARISON:  July 02, 2023 FINDINGS: The cardiomediastinal silhouette is unchanged and enlarged in contour.CardioMEMS. No pleural effusion. No pneumothorax. No acute pleuroparenchymal abnormality. IMPRESSION: No acute cardiopulmonary abnormality. Electronically Signed   By: Clancy Crimes M.D.   On: 08/20/2023 11:55   US  RENAL Result Date: 08/18/2023 CLINICAL DATA:  Acute renal failure EXAM: RENAL / URINARY TRACT ULTRASOUND COMPLETE COMPARISON:  06/21/2023 FINDINGS: Right Kidney: Renal measurements: 8.7 x 5.4 x 5.0 cm = volume: 122.6 mL. Echogenicity within normal limits. No mass or hydronephrosis visualized. Left Kidney: Renal measurements: 9.9 x 4.5 x 4.3 cm = volume: 100.4 mL. Echogenicity within normal limits. No mass or hydronephrosis visualized. Bladder: Appears normal for degree of bladder distention. Other: None. IMPRESSION: 1. Unremarkable renal ultrasound. Electronically Signed   By: Bobbye Burrow M.D.   On: 08/18/2023 16:54   ECHOCARDIOGRAM COMPLETE Result Date: 08/16/2023    ECHOCARDIOGRAM REPORT   Patient Name:   Jennifer [name] Date of Exam: 08/16/2023 Medical Rec #:  [id number]         Height:       57.0 in Accession #:    [id number]        Weight:       153.4 lb Date of Birth:  1991-03-07         BSA:          1.607 m Patient Age:    33 years          BP:           143/75 mmHg Patient Gender: F                 HR:           112 bpm. Exam Location:  Inpatient Procedure: 2D Echo, Cardiac Doppler, Color Doppler and Intracardiac            Opacification Agent (Both Spectral and Color Flow Doppler were            utilized during procedure). Indications:    Congestive Heart Failure I50.9  History:        Patient has prior history of Echocardiogram examinations, most  recent 06/10/2022. CHF, Arrythmias:Atrial  Fibrillation; Risk                 Factors:Hypertension and Diabetes.  Sonographer:    Astrid Blamer Referring Phys: [id number] DAVID MANUEL ORTIZ IMPRESSIONS  1. Left ventricular ejection fraction, by estimation, is 45 to 50%. The left ventricle has mildly decreased function. The left ventricle demonstrates global hypokinesis. Left ventricular diastolic function could not be evaluated.  2. Right ventricular systolic function is normal. The right ventricular size is normal. There is normal pulmonary artery systolic pressure.  3. The mitral valve is normal in structure. Trivial mitral valve regurgitation. No evidence of mitral stenosis.  4. The aortic valve is tricuspid. Aortic valve regurgitation is not visualized. No aortic stenosis is present.  5. The inferior vena cava is normal in size with <50% respiratory variability, suggesting right atrial pressure of 8 mmHg. FINDINGS  Left Ventricle: Left ventricular ejection fraction, by estimation, is 45 to 50%. The left ventricle has mildly decreased function. The left ventricle demonstrates global hypokinesis. The left ventricular internal cavity size was normal in size. There is  no left ventricular hypertrophy. Left ventricular diastolic function could not be evaluated. Right Ventricle: The right ventricular size is normal. No increase in right ventricular wall thickness. Right ventricular systolic function is normal. There is normal pulmonary artery systolic pressure. The tricuspid regurgitant velocity is 2.23 m/s, and  with an assumed right atrial pressure of 8 mmHg, the estimated right ventricular systolic pressure is 27.9 mmHg. Left Atrium: Left atrial size was normal in size. Right Atrium: Right atrial size was normal in size. Pericardium: There is no evidence of pericardial effusion. Mitral Valve: The mitral valve is normal in structure. Trivial mitral valve regurgitation. No evidence of mitral valve stenosis. Tricuspid Valve: The tricuspid valve is normal in  structure. Tricuspid valve regurgitation is trivial. No evidence of tricuspid stenosis. Aortic Valve: The aortic valve is tricuspid. Aortic valve regurgitation is not visualized. No aortic stenosis is present. Aortic valve mean gradient measures 5.0 mmHg. Aortic valve peak gradient measures 8.0 mmHg. Aortic valve area, by VTI measures 1.82 cm. Pulmonic Valve: The pulmonic valve was normal in structure. Pulmonic valve regurgitation is not visualized. No evidence of pulmonic stenosis. Aorta: The aortic root is normal in size and structure. Venous: The inferior vena cava is normal in size with less than 50% respiratory variability, suggesting right atrial pressure of 8 mmHg. IAS/Shunts: No atrial level shunt detected by color flow Doppler.  LEFT VENTRICLE PLAX 2D LVIDd:         4.50 cm   Diastology LVIDs:         3.50 cm   LV e' lateral:   7.94 cm/s LV PW:         1.00 cm   LV E/e' lateral: 13.2 LV IVS:        0.85 cm LVOT diam:     1.80 cm LV SV:         45 LV SV Index:   28 LVOT Area:     2.54 cm  RIGHT VENTRICLE RV S prime:     12.40 cm/s TAPSE (M-mode): 1.9 cm LEFT ATRIUM             Index        RIGHT ATRIUM           Index LA Vol (A2C):   23.7 ml 14.75 ml/m  RA Area:     11.50 cm LA Vol (A4C):  32.1 ml 19.97 ml/m  RA Volume:   26.90 ml  16.74 ml/m LA Biplane Vol: 27.7 ml 17.24 ml/m  AORTIC VALVE AV Area (Vmax):    1.89 cm AV Area (Vmean):   1.81 cm AV Area (VTI):     1.82 cm AV Vmax:           141.00 cm/s AV Vmean:          113.000 cm/s AV VTI:            0.245 m AV Peak Grad:      8.0 mmHg AV Mean Grad:      5.0 mmHg LVOT Vmax:         105.00 cm/s LVOT Vmean:        80.500 cm/s LVOT VTI:          0.175 m LVOT/AV VTI ratio: 0.71  AORTA Ao Root diam: 2.00 cm MITRAL VALVE                TRICUSPID VALVE MV Area (PHT): 8.15 cm     TR Peak grad:   19.9 mmHg MV E velocity: 105.00 cm/s  TR Vmax:        223.00 cm/s MV A velocity: 44.60 cm/s MV E/A ratio:  2.35         SHUNTS                              Systemic VTI:  0.18 m                             Systemic Diam: 1.80 cm Maudine Sos MD Electronically signed by Maudine Sos MD Signature Date/Time: 08/16/2023/12:05:38 PM    Final    US  Abdomen Limited Result Date: 08/15/2023 CLINICAL DATA:  33 year old female with history of chronic pancreatitis, epigastric pain, and gastroparesis, with new onset ascites. IR requested for diagnostic and therapeutic paracentesis. EXAM: ULTRASOUND ABDOMEN LIMITED COMPARISON:  CT abdomen pelvis with contrast on 06/21/2023 FINDINGS: Limited ultrasound done of all 4 quadrants. There is only small volume of ascites present. There is no pocket of fluid large enough to allow for safe approach for paracentesis. IMPRESSION: Small volume ascites without pocket of fluid large enough to allow for safe approach for paracentesis. Ultrasound performed by: Lambert Pillion, PA-C Electronically Signed   By: Creasie Doctor M.D.   On: 08/15/2023 15:52    Microbiology: Results for orders placed or performed during the hospital encounter of 08/15/23  MRSA Next Gen by PCR, Nasal     Status: None   Collection Time: 08/15/23  5:21 PM   Specimen: Nasal Mucosa; Nasal Swab  Result Value Ref Range Status   MRSA by PCR Next Gen NOT DETECTED NOT DETECTED Final    Comment: (NOTE) The GeneXpert MRSA Assay (FDA approved for NASAL specimens only), is one component of a comprehensive MRSA colonization surveillance program. It is not intended to diagnose MRSA infection nor to guide or monitor treatment for MRSA infections. Test performance is not FDA approved in patients less than 95 years old. Performed at [facility], [address] [place], [place] [address]     Labs: CBC: Recent Labs  Lab 08/21/23 0449 08/22/23 0330 08/23/23 0421  WBC 7.0 6.6 7.0  HGB 9.1* 8.7* 8.6*  HCT 28.6* 27.7* 28.1*  MCV 96.3 98.9 99.6  PLT 104* 102* 126*   Basic  Metabolic Panel: Recent Labs  Lab 08/22/23 0330 08/23/23 0420  08/23/23 0844 08/24/23 0417 08/25/23 0353 08/26/23 0439 08/26/23 0440  NA 132* 135  --  134* 135 133* 132*  K 4.3 3.9  --  3.7 3.7 4.0 4.0  CL 99 99  --  93* 90* 90* 89*  CO2 20* 24  --  29 32 30 29  GLUCOSE 166* 84 67* 228* 232* 269* 261*  BUN 54* 57*  --  56* 60* 64* 66*  CREATININE 3.06* 3.01*  --  2.89* 2.47* 2.53* 2.51*  CALCIUM  8.7* 9.1  --  9.1 9.9 9.7 9.6  MG  --   --   --  2.3  --  2.4  --   PHOS 6.2* 5.7*  --   --   --   --  4.8*   Liver Function Tests: Recent Labs  Lab 08/22/23 0330 08/23/23 0420 08/26/23 0440  ALBUMIN  4.1 4.0 4.0   CBG: Recent Labs  Lab 08/25/23 1157 08/25/23 1617 08/25/23 2058 08/26/23 0724 08/26/23 1221  GLUCAP 170* 190* 190* 218* 304*    Discharge time spent: greater than 30 minutes.  Signed: True Fuss, MD [facility] 08/28/2023

## 2023-08-26 NOTE — Discharge Instructions (Signed)
 Please take all prescribed medications exactly as instructed including discontinuing your U-500 insulin  and instead using a Semglee  insulin  pen , injecting yourself with 44 units twice daily. Please check your blood sugars three times daily prior to meals. Please consume a low carbohydrate diet.  Consume small meals.  Avoid greasy food, spicy food or alcohol .  Please increase your physical activity as tolerated. Please maintain all outpatient follow-up appointments including follow-up with your primary care provider and your endocrinologist. Please return to the emergency department if you develop worsening abdominal pain, fevers in excess of 100.4 F, weakness or inability to tolerate oral intake. A referral has been made for you to receive home health physical therapy in the next few days.  You should be contacted to establish care in the coming days.

## 2023-08-26 NOTE — Progress Notes (Signed)
 Mobility Specialist - Progress Note  Pre-mobility: 93 bpm HR,  During mobility: 115 bpm HR,  Post-mobility: 106 bpm HR,    08/26/23 0900  Mobility  Activity Ambulated with assistance in hallway  Level of Assistance Contact guard assist, steadying assist  Assistive Device None  Distance Ambulated (ft) 150 ft  Range of Motion/Exercises Active  Activity Response Tolerated well  Mobility Referral Yes  Mobility visit 1 Mobility  Mobility Specialist Start Time (ACUTE ONLY) 0847  Mobility Specialist Stop Time (ACUTE ONLY) 0900  Mobility Specialist Time Calculation (min) (ACUTE ONLY) 13 min   Pt was found on recliner chair and agreeable to ambulate. Had x1 cross step when turning which caused LOB which was able to correct with min-A. At EOS returned to recliner chair with all needs met. Call bell in reach.  Lorna Rose,  Mobility Specialist Can be reached via Secure Chat

## 2023-08-26 NOTE — Progress Notes (Signed)
 AVS given to patient and explained at the bedside. Medications and follow up appointments have been explained with pt verbalizing understanding. Pt calling [facility] for transport home.

## 2023-08-28 ENCOUNTER — Other Ambulatory Visit (HOSPITAL_COMMUNITY): Payer: Self-pay

## 2023-08-28 ENCOUNTER — Telehealth: Payer: Self-pay | Admitting: Cardiology

## 2023-08-28 NOTE — Telephone Encounter (Signed)
 TOC scheduled for 06/09 at 9:15am with Theotis Flake, PA per Dr. Nishan

## 2023-08-28 NOTE — Telephone Encounter (Signed)
 Patient contacted regarding discharge from [facility] on 08/28/23.  Patient understands to follow up with provider Render Carrie PA on 09/04/23 at 9:15 at [facility]. Patient understands discharge instructions? yes  Patient understands medications and regiment? yes  Patient understands to bring all medications to this visit? yes    Ask patient:  Are you enrolled in My Chart, yes

## 2023-08-29 ENCOUNTER — Inpatient Hospital Stay (HOSPITAL_BASED_OUTPATIENT_CLINIC_OR_DEPARTMENT_OTHER)
Admission: EM | Admit: 2023-08-29 | Discharge: 2023-09-03 | DRG: 638 | Disposition: A | Discharge status: Home / Self Care | Attending: Internal Medicine | Admitting: Internal Medicine

## 2023-08-29 ENCOUNTER — Encounter (HOSPITAL_BASED_OUTPATIENT_CLINIC_OR_DEPARTMENT_OTHER): Payer: Self-pay

## 2023-08-29 ENCOUNTER — Other Ambulatory Visit: Payer: Self-pay

## 2023-08-29 DIAGNOSIS — E039 Hypothyroidism, unspecified: Secondary | ICD-10-CM | POA: Diagnosis not present

## 2023-08-29 DIAGNOSIS — R110 Nausea: Secondary | ICD-10-CM

## 2023-08-29 DIAGNOSIS — I951 Orthostatic hypotension: Secondary | ICD-10-CM | POA: Diagnosis not present

## 2023-08-29 DIAGNOSIS — R1084 Generalized abdominal pain: Secondary | ICD-10-CM | POA: Diagnosis not present

## 2023-08-29 DIAGNOSIS — R9431 Abnormal electrocardiogram [ECG] [EKG]: Secondary | ICD-10-CM | POA: Diagnosis not present

## 2023-08-29 DIAGNOSIS — E282 Polycystic ovarian syndrome: Secondary | ICD-10-CM | POA: Diagnosis present

## 2023-08-29 DIAGNOSIS — E781 Pure hyperglyceridemia: Secondary | ICD-10-CM | POA: Diagnosis present

## 2023-08-29 DIAGNOSIS — Z880 Allergy status to penicillin: Secondary | ICD-10-CM | POA: Diagnosis not present

## 2023-08-29 DIAGNOSIS — L02415 Cutaneous abscess of right lower limb: Secondary | ICD-10-CM

## 2023-08-29 DIAGNOSIS — I482 Chronic atrial fibrillation, unspecified: Secondary | ICD-10-CM | POA: Diagnosis not present

## 2023-08-29 DIAGNOSIS — I130 Hypertensive heart and chronic kidney disease with heart failure and stage 1 through stage 4 chronic kidney disease, or unspecified chronic kidney disease: Secondary | ICD-10-CM | POA: Diagnosis not present

## 2023-08-29 DIAGNOSIS — R1013 Epigastric pain: Secondary | ICD-10-CM

## 2023-08-29 DIAGNOSIS — Z800 Family history of malignant neoplasm of digestive organs: Secondary | ICD-10-CM

## 2023-08-29 DIAGNOSIS — N390 Urinary tract infection, site not specified: Secondary | ICD-10-CM | POA: Diagnosis present

## 2023-08-29 DIAGNOSIS — L0291 Cutaneous abscess, unspecified: Secondary | ICD-10-CM

## 2023-08-29 DIAGNOSIS — Z79899 Other long term (current) drug therapy: Secondary | ICD-10-CM

## 2023-08-29 DIAGNOSIS — I10 Essential (primary) hypertension: Secondary | ICD-10-CM | POA: Diagnosis not present

## 2023-08-29 DIAGNOSIS — E1165 Type 2 diabetes mellitus with hyperglycemia: Secondary | ICD-10-CM | POA: Diagnosis present

## 2023-08-29 DIAGNOSIS — Z85841 Personal history of malignant neoplasm of brain: Secondary | ICD-10-CM

## 2023-08-29 DIAGNOSIS — E1142 Type 2 diabetes mellitus with diabetic polyneuropathy: Secondary | ICD-10-CM | POA: Diagnosis present

## 2023-08-29 DIAGNOSIS — G8929 Other chronic pain: Secondary | ICD-10-CM | POA: Diagnosis not present

## 2023-08-29 DIAGNOSIS — Z794 Long term (current) use of insulin: Secondary | ICD-10-CM

## 2023-08-29 DIAGNOSIS — R748 Abnormal levels of other serum enzymes: Secondary | ICD-10-CM | POA: Diagnosis present

## 2023-08-29 DIAGNOSIS — E871 Hypo-osmolality and hyponatremia: Secondary | ICD-10-CM | POA: Diagnosis present

## 2023-08-29 DIAGNOSIS — R7989 Other specified abnormal findings of blood chemistry: Secondary | ICD-10-CM | POA: Diagnosis present

## 2023-08-29 DIAGNOSIS — R197 Diarrhea, unspecified: Secondary | ICD-10-CM | POA: Diagnosis present

## 2023-08-29 DIAGNOSIS — L02214 Cutaneous abscess of groin: Secondary | ICD-10-CM | POA: Diagnosis not present

## 2023-08-29 DIAGNOSIS — R109 Unspecified abdominal pain: Secondary | ICD-10-CM | POA: Diagnosis not present

## 2023-08-29 DIAGNOSIS — Z833 Family history of diabetes mellitus: Secondary | ICD-10-CM

## 2023-08-29 DIAGNOSIS — Z7982 Long term (current) use of aspirin: Secondary | ICD-10-CM

## 2023-08-29 DIAGNOSIS — I5022 Chronic systolic (congestive) heart failure: Secondary | ICD-10-CM | POA: Diagnosis present

## 2023-08-29 DIAGNOSIS — Z8349 Family history of other endocrine, nutritional and metabolic diseases: Secondary | ICD-10-CM

## 2023-08-29 DIAGNOSIS — E1122 Type 2 diabetes mellitus with diabetic chronic kidney disease: Secondary | ICD-10-CM | POA: Diagnosis not present

## 2023-08-29 DIAGNOSIS — I5043 Acute on chronic combined systolic (congestive) and diastolic (congestive) heart failure: Secondary | ICD-10-CM | POA: Diagnosis not present

## 2023-08-29 DIAGNOSIS — N179 Acute kidney failure, unspecified: Secondary | ICD-10-CM | POA: Diagnosis not present

## 2023-08-29 DIAGNOSIS — Z91013 Allergy to seafood: Secondary | ICD-10-CM

## 2023-08-29 DIAGNOSIS — K861 Other chronic pancreatitis: Secondary | ICD-10-CM | POA: Diagnosis present

## 2023-08-29 DIAGNOSIS — G4730 Sleep apnea, unspecified: Secondary | ICD-10-CM

## 2023-08-29 DIAGNOSIS — Z888 Allergy status to other drugs, medicaments and biological substances status: Secondary | ICD-10-CM | POA: Diagnosis not present

## 2023-08-29 DIAGNOSIS — Z885 Allergy status to narcotic agent status: Secondary | ICD-10-CM | POA: Diagnosis not present

## 2023-08-29 DIAGNOSIS — N1832 Chronic kidney disease, stage 3b: Secondary | ICD-10-CM | POA: Diagnosis present

## 2023-08-29 DIAGNOSIS — R739 Hyperglycemia, unspecified: Principal | ICD-10-CM | POA: Diagnosis present

## 2023-08-29 DIAGNOSIS — K760 Fatty (change of) liver, not elsewhere classified: Secondary | ICD-10-CM | POA: Diagnosis present

## 2023-08-29 DIAGNOSIS — N184 Chronic kidney disease, stage 4 (severe): Secondary | ICD-10-CM | POA: Diagnosis present

## 2023-08-29 DIAGNOSIS — Z79890 Hormone replacement therapy (postmenopausal): Secondary | ICD-10-CM

## 2023-08-29 DIAGNOSIS — I4891 Unspecified atrial fibrillation: Secondary | ICD-10-CM | POA: Diagnosis present

## 2023-08-29 DIAGNOSIS — G4734 Idiopathic sleep related nonobstructive alveolar hypoventilation: Secondary | ICD-10-CM | POA: Diagnosis present

## 2023-08-29 DIAGNOSIS — Z83438 Family history of other disorder of lipoprotein metabolism and other lipidemia: Secondary | ICD-10-CM

## 2023-08-29 DIAGNOSIS — Z8249 Family history of ischemic heart disease and other diseases of the circulatory system: Secondary | ICD-10-CM

## 2023-08-29 DIAGNOSIS — R112 Nausea with vomiting, unspecified: Secondary | ICD-10-CM | POA: Diagnosis not present

## 2023-08-29 LAB — I-STAT VENOUS BLOOD GAS, ED
Acid-base deficit: 3 mmol/L — ABNORMAL HIGH (ref 0.0–2.0)
Bicarbonate: 22.5 mmol/L (ref 20.0–28.0)
Calcium, Ion: 1.26 mmol/L (ref 1.15–1.40)
HCT: 37 % (ref 36.0–46.0)
Hemoglobin: 12.6 g/dL (ref 12.0–15.0)
O2 Saturation: 89 %
Potassium: 5.3 mmol/L — ABNORMAL HIGH (ref 3.5–5.1)
Sodium: 127 mmol/L — ABNORMAL LOW (ref 135–145)
TCO2: 24 mmol/L (ref 22–32)
pCO2, Ven: 42 mmHg — ABNORMAL LOW (ref 44–60)
pH, Ven: 7.336 (ref 7.25–7.43)
pO2, Ven: 60 mmHg — ABNORMAL HIGH (ref 32–45)

## 2023-08-29 LAB — COMPREHENSIVE METABOLIC PANEL WITH GFR
ALT: 105 U/L — ABNORMAL HIGH (ref 0–44)
AST: 78 U/L — ABNORMAL HIGH (ref 15–41)
Albumin: 5 g/dL (ref 3.5–5.0)
Alkaline Phosphatase: 150 U/L — ABNORMAL HIGH (ref 38–126)
Anion gap: 18 — ABNORMAL HIGH (ref 5–15)
BUN: 64 mg/dL — ABNORMAL HIGH (ref 6–20)
CO2: 21 mmol/L — ABNORMAL LOW (ref 22–32)
Calcium: 10.1 mg/dL (ref 8.9–10.3)
Chloride: 89 mmol/L — ABNORMAL LOW (ref 98–111)
Creatinine, Ser: 2.39 mg/dL — ABNORMAL HIGH (ref 0.44–1.00)
GFR, Estimated: 27 mL/min — ABNORMAL LOW (ref >60)
Glucose, Bld: 679 mg/dL (ref 70–99)
Potassium: 4.9 mmol/L (ref 3.5–5.1)
Sodium: 128 mmol/L — ABNORMAL LOW (ref 135–145)
Total Bilirubin: 0.5 mg/dL (ref 0.0–1.2)
Total Protein: 8.6 g/dL — ABNORMAL HIGH (ref 6.5–8.1)

## 2023-08-29 LAB — CBC
HCT: 36.4 % (ref 36.0–46.0)
Hemoglobin: 12.2 g/dL (ref 12.0–15.0)
MCH: 29.8 pg (ref 26.0–34.0)
MCHC: 33.5 g/dL (ref 30.0–36.0)
MCV: 88.8 fL (ref 80.0–100.0)
Platelets: 200 10*3/uL (ref 150–400)
RBC: 4.1 MIL/uL (ref 3.87–5.11)
RDW: 14.1 % (ref 11.5–15.5)
WBC: 10.4 10*3/uL (ref 4.0–10.5)
nRBC: 0 % (ref 0.0–0.2)

## 2023-08-29 LAB — LACTIC ACID, PLASMA: Lactic Acid, Venous: 1.9 mmol/L (ref 0.5–1.9)

## 2023-08-29 LAB — CBG MONITORING, ED: Glucose-Capillary: >600 mg/dL (ref 70–99)

## 2023-08-29 LAB — LIPASE, BLOOD: Lipase: 43 U/L (ref 11–51)

## 2023-08-29 MED ORDER — ONDANSETRON HCL 4 MG/2ML IJ SOLN
4.0000 mg | Freq: Once | INTRAMUSCULAR | Status: AC
  Administered 2023-08-29: 4 mg via INTRAVENOUS
  Filled 2023-08-29: qty 2

## 2023-08-29 MED ORDER — HYDROMORPHONE HCL 1 MG/ML IJ SOLN
1.0000 mg | Freq: Once | INTRAMUSCULAR | Status: AC
  Administered 2023-08-29: 1 mg via INTRAVENOUS
  Filled 2023-08-29: qty 1

## 2023-08-29 MED ORDER — DEXTROSE IN LACTATED RINGERS 5 % IV SOLN
INTRAVENOUS | Status: AC
Start: 2023-08-29 — End: 2023-08-30

## 2023-08-29 MED ORDER — INSULIN REGULAR(HUMAN) IN NACL 100-0.9 UT/100ML-% IV SOLN
INTRAVENOUS | Status: DC
  Administered 2023-08-29: 5.5 [IU]/h via INTRAVENOUS
  Filled 2023-08-29 (×3): qty 100

## 2023-08-29 MED ORDER — POTASSIUM CHLORIDE 10 MEQ/100ML IV SOLN
10.0000 meq | INTRAVENOUS | Status: AC
  Administered 2023-08-29 (×2): 10 meq via INTRAVENOUS
  Filled 2023-08-29: qty 100

## 2023-08-29 MED ORDER — LACTATED RINGERS IV BOLUS
500.0000 mL | Freq: Once | INTRAVENOUS | Status: AC
  Administered 2023-08-29: 500 mL via INTRAVENOUS

## 2023-08-29 MED ORDER — DEXTROSE 50 % IV SOLN: 0.0000 mL | INTRAVENOUS | Status: DC | PRN

## 2023-08-29 MED ORDER — LIDOCAINE-EPINEPHRINE (PF) 2 %-1:200000 IJ SOLN
10.0000 mL | Freq: Once | INTRAMUSCULAR | Status: AC
  Administered 2023-08-29: 10 mL
  Filled 2023-08-29: qty 20

## 2023-08-29 NOTE — ED Notes (Signed)
 X1 set of blood cultures sent to lab

## 2023-08-29 NOTE — ED Triage Notes (Signed)
 Pt presents c/o 'bump' to R inner thigh/groin, painful, red, swollen, ongoing x 'several weeks', getting larger. Also c/o severe abd pain ongoing since d/c from hospital x3 days ago for pancreatitis. +N/-V/+D. Unable to tolerate PO intake.

## 2023-08-29 NOTE — ED Provider Notes (Addendum)
 [place] EMERGENCY DEPARTMENT AT [facility] Provider Note   CSN: [id number] Arrival date & time: 08/29/23  2026     History  Chief Complaint  Patient presents with   Abdominal Pain   Abscess    Jennifer [name] is a 33 y.o. female.   Abdominal Pain Abscess    Patient has a history of brain tumor pancreatitis polycystic ovarian syndrome diabetes mellitus hypertension CHF A-fib chronic kidney disease gastroparesis.  Patient was recently admitted to the hospital on May 25.  She was treated for intractable abdominal pain associated with severe hypertriglyceridemia.  Patient was also treated for acute kidney injury and cardiogenic volume overload.  Patient states she feels like her pancreatitis is flaring up again.  [name] having pain throughout her abdomen.  Patient also states she has a swollen area on her inner thigh on the right side.  [name] requesting drainage.  No fevers.  No vomiting.  She has has had some loose stools.  Home Medications Prior to Admission medications   Medication Sig Start Date End Date Taking? Authorizing Provider  acetaminophen -codeine  (TYLENOL  #3) 300-30 MG tablet Take 1 tablet by mouth every 6 (six) hours as needed for moderate pain (pain score 4-6) or severe pain (pain score 7-10). 06/19/23   [provider]  amitriptyline  (ELAVIL ) 10 MG tablet Take 10 mg by mouth at bedtime.    [provider]  amLODipine  (NORVASC ) 5 MG tablet Take 1 tablet (5 mg total) by mouth daily. 07/10/23 10/08/23  Ruddy Corral M, PA-C  aspirin  EC 81 MG EC tablet Take 1 tablet (81 mg total) by mouth daily. Swallow whole. 12/07/20   Montey Apa, DO  azelastine  (ASTELIN ) 0.1 % nasal spray Place 2 sprays into both nostrils daily at 6 (six) AM.    [provider]  BAQSIMI ONE PACK 3 MG/DOSE POWD Place 1 Dose into the nose once as needed (hypoglycemia). 07/31/23   [provider]  BLISOVI  FE 1.5/30 1.5-30 MG-MCG tablet Take 1 tablet by  mouth daily.    [provider]  butalbital -acetaminophen -caffeine  (FIORICET ) 50-325-40 MG tablet Take 1 tablet by mouth daily as needed for headache. 09/26/22   [provider]  calcitRIOL  (ROCALTROL ) 0.25 MCG capsule Take 0.25 mcg by mouth daily. 08/04/23   [provider]  carvedilol  (COREG ) 6.25 MG tablet Take 1.5 tablets (9.375 mg total) by mouth 2 (two) times daily. 08/07/23   Milford, Arlice Bene, FNP  Continuous Glucose Sensor (DEXCOM G7 SENSOR) MISC Inject 1 Device into the skin See admin instructions. Change device every ten days 06/28/23   [provider]  CRANBERRY CONCENTRATE PO Take 1 tablet by mouth daily at 12 noon.    [provider]  CREON  36000-114000 units CPEP capsule Take 1 capsule (36,000 Units total) by mouth 3 (three) times daily. Patient not taking: Reported on 08/16/2023 05/10/23   Rai, Hurman Maiden, MD  DULoxetine  (CYMBALTA ) 60 MG capsule Take 60 mg by mouth at bedtime. 09/26/22   [provider]  ergocalciferol  (VITAMIN D2) 1.25 MG (50000 UT) capsule Take 50,000 Units by mouth every 7 (seven) days. Every Sunday    [provider]  fenofibrate  54 MG tablet Take 1 tablet (54 mg total) by mouth daily. Patient taking differently: Take 54 mg by mouth at bedtime. 12/01/22   [name], Aviva Lemmings, MD  Glucose Blood (BLOOD GLUCOSE TEST STRIPS) STRP 1 each by Does not apply route 3 (three) times daily. Use as directed to check blood  sugar. May dispense any manufacturer covered by patient's insurance and fits patient's device. 08/26/23   Shalhoub, Merrill Abide, MD  hydrALAZINE  (APRESOLINE ) 50 MG tablet Take 1.5 tablets (75 mg total) by mouth 3 (three) times daily. 07/10/23   Horace Lye, PA-C  insulin  glargine-yfgn (SEMGLEE ) 100 UNIT/ML Pen Inject 44 Units into the skin 2 (two) times daily. May substitute as needed per insurance. 08/26/23   Shalhoub, Merrill Abide, MD  Insulin  Pen Needle (PEN NEEDLES) 31G X 5 MM MISC Use as directed 3 (three)  times daily. 08/26/23   Shalhoub, Merrill Abide, MD  ivabradine  (CORLANOR ) 7.5 MG TABS tablet Take 1 tablet (7.5 mg total) by mouth 2 (two) times daily with a meal. Patient taking differently: Take 7.5 mg by mouth at bedtime. 01/31/23   Elmarie Hacking, FNP  Lancet Device MISC 1 each by Does not apply route 3 (three) times daily. May dispense any manufacturer covered by patient's insurance. 08/26/23   Shalhoub, Merrill Abide, MD  Lancets MISC 1 each by Does not apply route 3 (three) times daily. Use as directed to check blood sugar. May dispense any manufacturer covered by patient's insurance and fits patient's device. 08/26/23   Shalhoub, Merrill Abide, MD  levocetirizine (XYZAL ) 5 MG tablet Take 5 mg by mouth every evening. 03/05/22   [provider]  levothyroxine  (SYNTHROID ) 150 MCG tablet Take 150 mcg by mouth daily.    [provider]  metoCLOPramide  (REGLAN ) 5 MG tablet Take 1 tablet (5 mg total) by mouth every 8 (eight) hours as needed for nausea or vomiting. Patient taking differently: Take 5 mg by mouth daily at 12 noon. 05/10/23 05/09/24  Rai, Hurman Maiden, MD  Multiple Vitamin (MULTIVITAMIN WITH MINERALS) TABS tablet Take 1 tablet by mouth daily.    [provider]  oxyCODONE  (OXY IR/ROXICODONE ) 5 MG immediate release tablet Take 1 tablet (5 mg total) by mouth every 6 (six) hours as needed for severe pain (pain score 7-10). 08/26/23   Shalhoub, Merrill Abide, MD  pantoprazole  (PROTONIX ) 40 MG tablet Take 40 mg by mouth daily. 12/24/19   [provider]  polyethylene glycol (MIRALAX  / GLYCOLAX ) 17 g packet Take 17 g by mouth daily as needed for mild constipation. Also available OTC Patient not taking: Reported on 08/16/2023 05/10/23   Rai, Hurman Maiden, MD  pregabalin  (LYRICA ) 300 MG capsule Take 300 mg by mouth 2 (two) times daily.    [provider]  prochlorperazine  (COMPAZINE ) 10 MG tablet Take 10 mg by mouth at bedtime. Preventative    [provider]   promethazine  (PHENERGAN ) 25 MG tablet Take 1 tablet (25 mg total) by mouth every 6 (six) hours as needed for nausea or vomiting. 08/14/23   Edmonia Gottron, PA-C  REPATHA  SURECLICK 140 MG/ML SOAJ Inject 140 mg into the skin every 14 (fourteen) days. 08/10/23   [provider]  rosuvastatin  (CRESTOR ) 40 MG tablet Take 40 mg by mouth daily. 08/09/21   [provider]  Safety Seal Miscellaneous MISC Hormonic Hair Solution with minoxidil USP 7% and finasteride USP 0.05% - apply to affected areas daily every morning. Patient taking differently: Apply 1 Application topically at bedtime. Hormonic Hair Solution with minoxidil USP 7% and finasteride USP 0.05% - apply to affected areas daily every morning. 06/06/23   Dellar Fenton, DO  sodium bicarbonate  650 MG tablet Take 2 tablets (1,300 mg total) by mouth 2 (two) times daily. 05/10/23   Loma Rising, MD  sucralfate  (CARAFATE )  1 g tablet Take 1 tablet (1 g total) by mouth with breakfast, with lunch, and with evening meal for 7 days. Patient taking differently: Take 1 g by mouth daily at 12 noon. 06/18/23 08/16/23  Teddi Favors, DO  SUMAtriptan  (IMITREX ) 100 MG tablet Take 100 mg by mouth every 2 (two) hours as needed for migraine or headache. 08/14/23   [provider]  topiramate  (TOPAMAX ) 50 MG tablet Take 50 mg by mouth 2 (two) times daily.    [provider]  torsemide  (DEMADEX ) 20 MG tablet Take 3 tablets (60 mg total) by mouth daily. 07/10/23   Ruddy Corral M, PA-C      Allergies    Icosapent  ethyl (epa ethyl ester) (fish), Ketamine, [place], Morphine , Penicillins, Mushroom, Shellfish allergy, Fentanyl , and Prednisone    Review of Systems   Review of Systems  Gastrointestinal:  Positive for abdominal pain.    Physical Exam Updated Vital Signs BP (!) 140/82   Pulse (!) 104   Temp 98.2 F (36.8 C)   Resp (!) 24   Ht 1.448 m (4\' 9" )   Wt 63.5 kg   SpO2 95%   BMI 30.30 kg/m  Physical  Exam Vitals and nursing note reviewed.  Constitutional:      Appearance: [name] well-developed. [name] not diaphoretic.  HENT:     Head: Normocephalic and atraumatic.     Right Ear: External ear normal.     Left Ear: External ear normal.  Eyes:     General: No scleral icterus.       Right eye: No discharge.        Left eye: No discharge.     Conjunctiva/sclera: Conjunctivae normal.  Neck:     Trachea: No tracheal deviation.  Cardiovascular:     Rate and Rhythm: Normal rate and regular rhythm.  Pulmonary:     Effort: Pulmonary effort is normal. No respiratory distress.     Breath sounds: Normal breath sounds. No stridor. No wheezing or rales.  Abdominal:     General: Bowel sounds are normal. There is no distension.     Palpations: Abdomen is soft.     Tenderness: There is generalized abdominal tenderness. There is no guarding or rebound.  Musculoskeletal:        General: No tenderness or deformity.     Cervical back: Neck supple.     Comments: 1 to 2 cm area of induration fluctuance right inner thigh  Skin:    General: Skin is warm and dry.     Findings: No rash.  Neurological:     General: No focal deficit present.     Mental Status: [name] alert.     Cranial Nerves: No cranial nerve deficit, dysarthria or facial asymmetry.     Sensory: No sensory deficit.     Motor: No abnormal muscle tone or seizure activity.     Coordination: Coordination normal.  Psychiatric:        Mood and Affect: Mood normal.     ED Results / Procedures / Treatments   Labs (all labs ordered are listed, but only abnormal results are displayed) Labs Reviewed  COMPREHENSIVE METABOLIC PANEL WITH GFR - Abnormal; Notable for the following components:      Result Value   Sodium 128 (*)    Chloride 89 (*)    CO2 21 (*)    Glucose, Bld 679 (*)    BUN 64 (*)    Creatinine, Ser 2.39 (*)  Total Protein 8.6 (*)    AST 78 (*)    ALT 105 (*)    Alkaline Phosphatase 150 (*)    GFR, Estimated 27 (*)     Anion gap 18 (*)    All other components within normal limits  I-STAT VENOUS BLOOD GAS, ED - Abnormal; Notable for the following components:   pCO2, Ven 42.0 (*)    pO2, Ven 60 (*)    Acid-base deficit 3.0 (*)    Sodium 127 (*)    Potassium 5.3 (*)    All other components within normal limits  CBG MONITORING, ED - Abnormal; Notable for the following components:   Glucose-Capillary >600 (*)    All other components within normal limits  LIPASE, BLOOD  CBC  LACTIC ACID, PLASMA  URINALYSIS, ROUTINE W REFLEX MICROSCOPIC  PREGNANCY, URINE  BETA-HYDROXYBUTYRIC ACID    EKG None  Radiology No results found.  Procedures .Incision and Drainage  Date/Time: 08/29/2023 11:33 PM  Performed by: Trish Furl, MD Authorized by: Trish Furl, MD   Consent:    Consent obtained:  Verbal   Consent given by:  Patient   Risks discussed:  Bleeding, incomplete drainage, pain and damage to other organs   Alternatives discussed:  No treatment Universal protocol:    Procedure explained and questions answered to patient or proxy's satisfaction: yes     Relevant documents present and verified: yes     Test results available : yes     Imaging studies available: yes     Required blood products, implants, devices, and special equipment available: yes     Site/side marked: yes     Immediately prior to procedure, a time out was called: yes     Patient identity confirmed:  Verbally with patient Location:    Type:  Abscess Pre-procedure details:    Skin preparation:  Betadine Anesthesia:    Anesthesia method:  Local infiltration   Local anesthetic:  Lidocaine  1% WITH epi Procedure type:    Complexity:  Complex Procedure details:    Incision types:  Single straight   Incision depth:  Subcutaneous   Wound management:  Probed and deloculated, irrigated with saline and extensive cleaning   Drainage:  Purulent   Drainage amount:  Moderate   Wound treatment:  Wound left open   Packing materials:   None Post-procedure details:    Procedure completion:  Tolerated well, no immediate complications .Critical Care  Performed by: Trish Furl, MD Authorized by: Trish Furl, MD   Critical care provider statement:    Critical care time (minutes):  30   Critical care was time spent personally by me on the following activities:  Development of treatment plan with patient or surrogate, discussions with consultants, evaluation of patient's response to treatment, examination of patient, ordering and review of laboratory studies, ordering and review of radiographic studies, ordering and performing treatments and interventions, pulse oximetry, re-evaluation of patient's condition and review of old charts     Medications Ordered in ED Medications  insulin  regular, human (MYXREDLIN ) 100 units/ 100 mL infusion (5.5 Units/hr Intravenous New Bag/Given 08/29/23 2325)  dextrose  5 % in lactated ringers  infusion ( Intravenous Not Given 08/29/23 2239)  dextrose  50 % solution 0-50 mL (has no administration in time range)  potassium chloride  10 mEq in 100 mL IVPB (10 mEq Intravenous New Bag/Given 08/29/23 2251)  ondansetron  (ZOFRAN ) injection 4 mg (has no administration in time range)  lactated ringers  bolus 500 mL (500 mLs Intravenous New Bag/Given  08/29/23 2250)  HYDROmorphone  (DILAUDID ) injection 1 mg (1 mg Intravenous Given 08/29/23 2248)  lidocaine -EPINEPHrine  (XYLOCAINE  W/EPI) 2 %-1:200000 (PF) injection 10 mL (10 mLs Infiltration Given 08/29/23 2251)    ED Course/ Medical Decision Making/ A&P Clinical Course as of 08/29/23 2328  Tue Aug 29, 2023  2229 Lipase, blood Pace normal.  CBC normal.  Lactic acid level normal. [JK]  2230 Comprehensive metabolic panel(!!) Metabolic panel shows hyperglycemia with BUN/creatinine evaded at 64 and 2.39. [JK]  2230 Comprehensive metabolic panel(!!) LFTs are slightly elevated.  Elevated anion gap.  AST and ALT elevated on previous values. [JK]  2326 I-Stat venous blood gas,  ED(!) No acidosis noticed on venous blood gas [JK]  2327 Comprehensive metabolic panel(!!) Anion gap elevated compared to previous [JK]    Clinical Course User Index [JK] Trish Furl, MD                                 Medical Decision Making Problems Addressed: Abdominal pain, unspecified abdominal location: acute illness or injury that poses a threat to life or bodily functions Cutaneous abscess of right lower extremity: acute illness or injury that poses a threat to life or bodily functions Hyperglycemia: acute illness or injury that poses a threat to life or bodily functions  Amount and/or Complexity of Data Reviewed Labs: ordered. Decision-making details documented in ED Course.  Risk Prescription drug management.   Patient presented to the ED for evaluation of abdominal pain as well as a tender area on inner thigh.  On exam patient was noted to have a cutaneous abscess.  She did have incision and drainage in the ED.  No signs of surrounding cellulitis.  Patient had recurrent abdominal pain history of same.  No findings of pancreatitis at this time based on her laboratory test.  She has mildly persistent elevated LFTs.  Patient had an ultrasound of the abdomen on May 20.  Patient has history of recurrent abdominal pain issues.  I doubt appendicitis colitis diverticulitis cholecystitis at this time.  Suspect is related to recurrent gastroparesis.  Will hold off on advanced imaging at this time.  Patient does have recurrent hyperglycemia with an anion gap acidosis.  No acidemia noted on her venous blood gas.  Will continue with insulin  infusion.  Plan on admission for further treatment.        Final Clinical Impression(s) / ED Diagnoses Final diagnoses:  Hyperglycemia  Abdominal pain, unspecified abdominal location  Cutaneous abscess of right lower extremity    Rx / DC Orders ED Discharge Orders     None         Trish Furl, MD 08/29/23 1610    Trish Furl,  MD 08/29/23 2333

## 2023-08-29 NOTE — ED Provider Notes (Signed)
  Provider Note MRN:  [id number]  Arrival date & time: 08/30/23    ED Course and Medical Decision Making  Assumed care of patient at sign-out or upon transfer.  Hyperglycemia, possibly early DKA, history of recent AKI, recent CHF exacerbation, requesting hospitalist admission.  Procedures  Final Clinical Impressions(s) / ED Diagnoses     ICD-10-CM   1. Hyperglycemia  R73.9     2. Abdominal pain, unspecified abdominal location  R10.9     3. Cutaneous abscess of right lower extremity  L02.415       ED Discharge Orders     None       Discharge Instructions   None     Merrick Abe. Harless Lien, MD [facility] Emergency Medicine [facility] [email or web address]    Edson Graces, MD 08/30/23 [phone]

## 2023-08-30 DIAGNOSIS — R739 Hyperglycemia, unspecified: Secondary | ICD-10-CM

## 2023-08-30 DIAGNOSIS — E1165 Type 2 diabetes mellitus with hyperglycemia: Secondary | ICD-10-CM | POA: Diagnosis present

## 2023-08-30 DIAGNOSIS — G4730 Sleep apnea, unspecified: Secondary | ICD-10-CM

## 2023-08-30 DIAGNOSIS — L0291 Cutaneous abscess, unspecified: Secondary | ICD-10-CM

## 2023-08-30 LAB — BASIC METABOLIC PANEL WITH GFR
Anion gap: 15 (ref 5–15)
Anion gap: 10 (ref 5–15)
BUN: 63 mg/dL — ABNORMAL HIGH (ref 6–20)
BUN: 57 mg/dL — ABNORMAL HIGH (ref 6–20)
CO2: 24 mmol/L (ref 22–32)
CO2: 23 mmol/L (ref 22–32)
Calcium: 9.9 mg/dL (ref 8.9–10.3)
Calcium: 9.5 mg/dL (ref 8.9–10.3)
Chloride: 95 mmol/L — ABNORMAL LOW (ref 98–111)
Chloride: 103 mmol/L (ref 98–111)
Creatinine, Ser: 2.24 mg/dL — ABNORMAL HIGH (ref 0.44–1.00)
Creatinine, Ser: 2.11 mg/dL — ABNORMAL HIGH (ref 0.44–1.00)
GFR, Estimated: 29 mL/min — ABNORMAL LOW (ref >60)
GFR, Estimated: 31 mL/min — ABNORMAL LOW (ref >60)
Glucose, Bld: 325 mg/dL — ABNORMAL HIGH (ref 70–99)
Glucose, Bld: 147 mg/dL — ABNORMAL HIGH (ref 70–99)
Potassium: 4.5 mmol/L (ref 3.5–5.1)
Potassium: 4.3 mmol/L (ref 3.5–5.1)
Sodium: 134 mmol/L — ABNORMAL LOW (ref 135–145)
Sodium: 136 mmol/L (ref 135–145)

## 2023-08-30 LAB — CBG MONITORING, ED
Glucose-Capillary: 170 mg/dL — ABNORMAL HIGH (ref 70–99)
Glucose-Capillary: 172 mg/dL — ABNORMAL HIGH (ref 70–99)
Glucose-Capillary: 176 mg/dL — ABNORMAL HIGH (ref 70–99)
Glucose-Capillary: 203 mg/dL — ABNORMAL HIGH (ref 70–99)
Glucose-Capillary: 204 mg/dL — ABNORMAL HIGH (ref 70–99)
Glucose-Capillary: 210 mg/dL — ABNORMAL HIGH (ref 70–99)
Glucose-Capillary: 220 mg/dL — ABNORMAL HIGH (ref 70–99)
Glucose-Capillary: 227 mg/dL — ABNORMAL HIGH (ref 70–99)
Glucose-Capillary: 230 mg/dL — ABNORMAL HIGH (ref 70–99)
Glucose-Capillary: 230 mg/dL — ABNORMAL HIGH (ref 70–99)
Glucose-Capillary: 232 mg/dL — ABNORMAL HIGH (ref 70–99)
Glucose-Capillary: 280 mg/dL — ABNORMAL HIGH (ref 70–99)
Glucose-Capillary: 345 mg/dL — ABNORMAL HIGH (ref 70–99)
Glucose-Capillary: 379 mg/dL — ABNORMAL HIGH (ref 70–99)
Glucose-Capillary: 422 mg/dL — ABNORMAL HIGH (ref 70–99)
Glucose-Capillary: 471 mg/dL — ABNORMAL HIGH (ref 70–99)
Glucose-Capillary: 546 mg/dL (ref 70–99)
Glucose-Capillary: 576 mg/dL (ref 70–99)

## 2023-08-30 LAB — URINALYSIS, ROUTINE W REFLEX MICROSCOPIC
Bilirubin Urine: NEGATIVE
Glucose, UA: >1000 mg/dL — AB
Ketones, ur: NEGATIVE mg/dL
Nitrite: NEGATIVE
Protein, ur: 100 mg/dL — AB
RBC / HPF: >50 RBC/hpf (ref 0–5)
Specific Gravity, Urine: 1.019 (ref 1.005–1.030)
WBC, UA: >50 WBC/hpf (ref 0–5)
pH: 6 (ref 5.0–8.0)

## 2023-08-30 LAB — CBC WITH DIFFERENTIAL/PLATELET
Abs Immature Granulocytes: 0.01 10*3/uL (ref 0.00–0.07)
Basophils Absolute: 0.1 10*3/uL (ref 0.0–0.1)
Basophils Relative: 1 %
Eosinophils Absolute: 0.6 10*3/uL — ABNORMAL HIGH (ref 0.0–0.5)
Eosinophils Relative: 8 %
HCT: 34.4 % — ABNORMAL LOW (ref 36.0–46.0)
Hemoglobin: 11 g/dL — ABNORMAL LOW (ref 12.0–15.0)
Immature Granulocytes: 0 %
Lymphocytes Relative: 34 %
Lymphs Abs: 2.5 10*3/uL (ref 0.7–4.0)
MCH: 29.7 pg (ref 26.0–34.0)
MCHC: 32 g/dL (ref 30.0–36.0)
MCV: 93 fL (ref 80.0–100.0)
Monocytes Absolute: 0.5 10*3/uL (ref 0.1–1.0)
Monocytes Relative: 7 %
Neutro Abs: 3.7 10*3/uL (ref 1.7–7.7)
Neutrophils Relative %: 50 %
Platelets: 171 10*3/uL (ref 150–400)
RBC: 3.7 MIL/uL — ABNORMAL LOW (ref 3.87–5.11)
RDW: 14.1 % (ref 11.5–15.5)
WBC: 7.4 10*3/uL (ref 4.0–10.5)
nRBC: 0 % (ref 0.0–0.2)

## 2023-08-30 LAB — BETA-HYDROXYBUTYRIC ACID: Beta-Hydroxybutyric Acid: 0.07 mmol/L (ref 0.05–0.27)

## 2023-08-30 LAB — GLUCOSE, CAPILLARY
Glucose-Capillary: 147 mg/dL — ABNORMAL HIGH (ref 70–99)
Glucose-Capillary: 147 mg/dL — ABNORMAL HIGH (ref 70–99)
Glucose-Capillary: 151 mg/dL — ABNORMAL HIGH (ref 70–99)
Glucose-Capillary: 166 mg/dL — ABNORMAL HIGH (ref 70–99)
Glucose-Capillary: 174 mg/dL — ABNORMAL HIGH (ref 70–99)

## 2023-08-30 LAB — TRIGLYCERIDES: Triglycerides: 555 mg/dL — ABNORMAL HIGH (ref <150)

## 2023-08-30 LAB — MAGNESIUM: Magnesium: 2.1 mg/dL (ref 1.7–2.4)

## 2023-08-30 LAB — PREGNANCY, URINE: Preg Test, Ur: NEGATIVE

## 2023-08-30 MED ORDER — METOPROLOL TARTRATE 5 MG/5ML IV SOLN
5.0000 mg | Freq: Four times a day (QID) | INTRAVENOUS | Status: DC | PRN
  Administered 2023-08-31 – 2023-09-01 (×3): 5 mg via INTRAVENOUS
  Filled 2023-08-30 (×3): qty 5

## 2023-08-30 MED ORDER — DIPHENHYDRAMINE HCL 50 MG/ML IJ SOLN
25.0000 mg | Freq: Once | INTRAMUSCULAR | Status: AC
  Administered 2023-08-30: 25 mg via INTRAVENOUS
  Filled 2023-08-30: qty 1

## 2023-08-30 MED ORDER — HYDROMORPHONE HCL 1 MG/ML IJ SOLN
1.0000 mg | Freq: Once | INTRAMUSCULAR | Status: AC
  Administered 2023-08-30: 1 mg via INTRAVENOUS
  Filled 2023-08-30: qty 1

## 2023-08-30 MED ORDER — SODIUM CHLORIDE 0.9 % IV SOLN
2.0000 g | Freq: Once | INTRAVENOUS | Status: AC
  Administered 2023-08-30: 2 g via INTRAVENOUS
  Filled 2023-08-30: qty 20

## 2023-08-30 MED ORDER — SODIUM CHLORIDE 0.9 % IV SOLN
2.0000 g | INTRAVENOUS | Status: DC
  Administered 2023-08-31 – 2023-09-02 (×3): 2 g via INTRAVENOUS
  Filled 2023-08-30 (×3): qty 20

## 2023-08-30 MED ORDER — ORAL CARE MOUTH RINSE: 15.0000 mL | OROMUCOSAL | Status: DC | PRN

## 2023-08-30 MED ORDER — HYDRALAZINE HCL 20 MG/ML IJ SOLN
10.0000 mg | Freq: Once | INTRAMUSCULAR | Status: AC
  Administered 2023-08-30: 10 mg via INTRAVENOUS
  Filled 2023-08-30: qty 1

## 2023-08-30 MED ORDER — HEPARIN SODIUM (PORCINE) 5000 UNIT/ML IJ SOLN
5000.0000 [IU] | Freq: Three times a day (TID) | INTRAMUSCULAR | Status: AC
  Administered 2023-08-30 – 2023-08-31 (×4): 5000 [IU] via SUBCUTANEOUS
  Filled 2023-08-30 (×4): qty 1

## 2023-08-30 MED ORDER — CHLORHEXIDINE GLUCONATE CLOTH 2 % EX PADS
6.0000 | MEDICATED_PAD | Freq: Every day | CUTANEOUS | Status: DC
  Administered 2023-08-30 – 2023-09-01 (×3): 6 via TOPICAL

## 2023-08-30 MED ORDER — OXYCODONE HCL 5 MG PO TABS
5.0000 mg | ORAL_TABLET | ORAL | Status: DC | PRN
  Administered 2023-08-30 – 2023-09-01 (×8): 5 mg via ORAL
  Filled 2023-08-30 (×8): qty 1

## 2023-08-30 MED ORDER — HYDROMORPHONE HCL 1 MG/ML IJ SOLN
0.5000 mg | INTRAMUSCULAR | Status: DC | PRN
  Administered 2023-08-30 – 2023-09-03 (×21): 0.5 mg via INTRAVENOUS
  Filled 2023-08-30 (×3): qty 1
  Filled 2023-08-30: qty 0.5
  Filled 2023-08-30: qty 1
  Filled 2023-08-30: qty 0.5
  Filled 2023-08-30: qty 1
  Filled 2023-08-30: qty 0.5
  Filled 2023-08-30: qty 1
  Filled 2023-08-30: qty 0.5
  Filled 2023-08-30 (×5): qty 1
  Filled 2023-08-30 (×4): qty 0.5
  Filled 2023-08-30 (×2): qty 1
  Filled 2023-08-30: qty 0.5

## 2023-08-30 MED ORDER — ONDANSETRON HCL 4 MG/2ML IJ SOLN
4.0000 mg | Freq: Once | INTRAMUSCULAR | Status: AC
  Administered 2023-08-30: 4 mg via INTRAVENOUS
  Filled 2023-08-30: qty 2

## 2023-08-30 MED ORDER — HYDROXYZINE HCL 25 MG PO TABS
25.0000 mg | ORAL_TABLET | Freq: Three times a day (TID) | ORAL | Status: DC | PRN
  Administered 2023-08-30 – 2023-08-31 (×2): 25 mg via ORAL
  Filled 2023-08-30 (×2): qty 1

## 2023-08-30 NOTE — Assessment & Plan Note (Addendum)
 States having 3-4 episodes of non bloody diarrhea q hour which is new for her ? If any EPI with chronic pancreatitis  Some lower quadrant pain, but no fever/leukocytosis or SIRS criteria Check stool PCR and C.Diff with recent hospitalization Enteric precautions  Consider CT if no improvement in symptoms

## 2023-08-30 NOTE — Assessment & Plan Note (Signed)
 Transaminases appear at baseline  Known hepatic steatosis  Not overloaded on exam  Lifestyle modification Trend

## 2023-08-30 NOTE — Assessment & Plan Note (Addendum)
 TSH abnormal in May 2025 Meds increased during recent hospitalization  Repeat in 6-8 weeks with PCP  Continue synthroid  150mcg

## 2023-08-30 NOTE — Assessment & Plan Note (Addendum)
 33 year old presenting to ED with abdominal pain, groin abscess and uncontrolled sugars found to be hyperglycemic with glucose of >600 in setting of T2DM. NO criteria for DKA or HHS  -obs to stepdown -A1C of 6.1 in 04/2023 -possible multiple causes due to infection (abscess) vs. Change in insulin .vs diarrhea.  Her insulin  was changed at recent hospitalization and she doesn't feel like it's doing as well as her last insulin . Last took insulin  6/3.  -started on hyperglycemia/insulin  drip and sugars now controlled -fluids decreased to 75cc/hour so as not to overload her.  -she has abdominal pain and doesn't want to eat, so will continue insulin  overnight until ready to transition to [facility] and PO intake, especially in setting of abscess -diabetic consult, consider insulin  adjustment

## 2023-08-30 NOTE — Plan of Care (Signed)
 Plan of Care Note for accepted transfer   Patient name: Jennifer [name] ZOX:[id number] DOB: [birth year]  Facility requesting transfer: [facility] ED Requesting Provider: Dr. Harless Lien Facility course: 33 year old female with history of paroxysmal A-fib, astrocytoma, chronic systolic CHF, QT prolongation, lipoprotein deficiency, familial hypertriglyceridemia, NASH, CKD stage IV, class I obesity, type 2 diabetes, hypertension, hypothyroidism, chronic pancreatitis, gastroparesis, peripheral neuropathy, history of pancytopenia, GERD, PCOS.  She was recently admitted 5/20-5/31 for severe hypertriglyceridemia induced pancreatitis, CHF exacerbation, AKI, and hyponatremia.  Please see discharge summary for details.  Patient presents to the ED today with complaints of severe generalized abdominal pain.  Also has a small abscess on her right inner thigh which was drained in the ED.  Labs showing WBC count 10.4, glucose 679, bicarb 21, anion gap 18, BUN 64, creatinine 2.4 (stable), AST 78, ALT 105, alk phos 150, T. bili 0.5, lipase normal, lactate normal, beta hydroxybutyric acid normal, VBG showing normal pH, UA without ketones but showing signs of infection, urine pregnancy test negative. Patient was given Dilaudid , Zofran , IV potassium, IV fluids, and started on insulin  drip.  She will be given antibiotic for possible UTI.  Plan of care: The patient is accepted for admission to Progressive unit at [facility].  [facility] will assume care on arrival to accepting facility. Until arrival, care as per EDP. However, [facility] available 24/7 for questions and assistance.  Check [email or web address] for on-call coverage.  Nursing staff, please call [facility] Admits & Consults System-Wide number under Amion on patient's arrival so appropriate admitting provider can evaluate the pt.

## 2023-08-30 NOTE — Assessment & Plan Note (Signed)
 PRN lopressor  while NPO Resume home medication after transition to  insulin 

## 2023-08-30 NOTE — Assessment & Plan Note (Addendum)
 Aggressive management of hypertriglyceridemia with fenofibrate  and Crestor   -improved to 555

## 2023-08-30 NOTE — Assessment & Plan Note (Addendum)
 Positive orthostatics on admission ? Dysautonomic vs high dose lasix   TED hose Judicious IVF  Repeat tomorrow

## 2023-08-30 NOTE — ED Notes (Signed)
 I have just given report to Kaitlin, Charity fundraiser at [employer].

## 2023-08-30 NOTE — ED Notes (Addendum)
 Charge Nurse Fabio Holts , made aware of rate change verification.

## 2023-08-30 NOTE — Assessment & Plan Note (Signed)
 Baseline, Check magnesium  Avoid QT prolonging drugs Keep on tele

## 2023-08-30 NOTE — ED Notes (Signed)
She leaves with [facility] at this time.

## 2023-08-30 NOTE — Progress Notes (Signed)
 Brief Progress Note  Patient previously accepted for progressive bed for hyperglycemia on insulin  infusion and abscess with ?further infection.  Was contacted to see if patient would be appropriate to send to a stepdown bed instead because of that would be more readily available.  This would represent an upgrade in her level of care and would be appropriate level of care at this time.  Chart briefly reviewed, no current needs available only at [facility] main campus and patient was recently admitted to [facility]  Upon evaluation by admitting physician her level of care could be downgraded if appropriate.  Alec Billie Intriago, DO [facility]

## 2023-08-30 NOTE — Assessment & Plan Note (Signed)
 Continue cpap at night

## 2023-08-30 NOTE — Assessment & Plan Note (Signed)
 No signs/symptoms of volume overload at this time Judicious fluid, turning down rate so as not to overload her Continue medical management with her coreg , torsemide  and ivabradine   Strict I/O and daily weights

## 2023-08-30 NOTE — Assessment & Plan Note (Signed)
 Right groin abscess with induration s/p I&D. Not draining Possibly source of hyperglycemia Continue rocephin  for now and monitoring Continue sugar control

## 2023-08-30 NOTE — Assessment & Plan Note (Addendum)
-  thought secondary to her familial hypertriglyceridemia -lipase normal today, no acute on chronic  -continue with TG control, creon   -follows with GI outpatient  -continue oxycodone  for pain with PRN dilaudid  for severe pain

## 2023-08-30 NOTE — Assessment & Plan Note (Addendum)
 CKD 3b: b/l creat 1.7- 2.3 from April 2025 Stable at 2.1  Continue to monitor closely

## 2023-08-30 NOTE — H&P (Signed)
 History and Physical    Patient: Jennifer [name] UEA:[id number] DOB: 1990/09/13 DOA: 08/29/2023 DOS: the patient was seen and examined on 08/30/2023 PCP: Calton Catholic, PA-C  Patient coming from: [facility] - lives alone. Ambulates independently.    Chief Complaint: abdominal pain and abscess on her leg.   HPI: Jennifer [name] is a 33 y.o. female with medical history significant of hx of astrocytoma, chronic systolic CHF, QT prolongation, lipoprotein deficiency, familial TG, NASH, CKD stage IV, T2DM, HTN, hypothyroidism, chronic pancreatitis, gastroparesis, peripheral neuropathy, GERD who presented to ED with complaints of severe generalized abdominal pain and abscess on her leg. She states she came to ED to have her abscess looked out and also because the pain in her stomach has never resolved. She states her stomach was hurting when she was discharged, but has gotten progressively worse since being home. Pain is epigastric in nature. Pain is constant in nature. Doesn't radiate. Described as achy in nature. Eating makes it worse. She has only eaten once since being home. She states she has been taking her insulin  and has not missed any doses. Her insulin  was changed when she was discharged just a few days ago and she doesn't think it's working well.   She noticed the abscess on her leg weeks ago and has had since she was in the hospital. She was trying to wait to go to the derm, but it was getting bigger and rubbing against her clothes so she came to ED. She has had chills, no fevers. She has had nausea, but no vomiting. She also has had diarrhea that started two to three days ago. [name] having 3-4 episodes/hour. Denies any blood in her stool.   Admitted for prolonged hospitalization form 5/20-08/28/23 for severe abdominal pain and placed on insulin  infusion due to concerns for hypertriglyceridemia induced pancreatitis. As a result of IV volume resuscitation she developed acute cardiogenic volume  overload. Cardiology was consulted. Hospitalization additionally complicated by AKI superimposed on CKD stage 4 with worsening hyponatremia prompting nephrology consult.    Denies any vision changes/headaches, chest pain or palpitations, shortness of breath or cough,vomiting, dysuria or leg swelling.    She does not smoke or drink alcohol .   ER Course:  vitals: afebrile, bup: 168/89, HR: 93, RR: 22, oxygen: 98% on RA  Pertinent labs: sodium: 128, glucose: 679, BUN: 64, creatinine: 2.39, AST: 78, ALT: 105, alk phos: 150, AG: 18,  In ED: started on DKA protocol/insulin  gtt. Given pain meds, zofran  and also had abscess drained on leg. [facility] asked to admit.    Review of Systems: As mentioned in the history of present illness. All other systems reviewed and are negative. Past Medical History:  Diagnosis Date   Afib (HCC) 05/12/2021   Brain tumor (HCC) 03/29/1995   astrocytoma   CHF (congestive heart failure) (HCC)    Cholesterosis    CKD (chronic kidney disease) stage 4, GFR 15-29 ml/min (HCC) 05/13/2021   DM (diabetes mellitus) (HCC) 10/10/2018   Fatty liver    Gastroparesis due to DM (HCC) 05/12/2021   HTN (hypertension) 10/10/2018   Hypothyroidism 10/10/2018   Lipoprotein deficiency    Lung disease    longevity long term   Pancreatitis    Polycystic ovary syndrome    Past Surgical History:  Procedure Laterality Date   ABDOMINAL SURGERY     pt states during miscarriage got her intestine   BRAIN SURGERY     EYE MUSCLE SURGERY Right 03/28/2014   PRESSURE SENSOR/CARDIOMEMS N/A  12/06/2021   Procedure: PRESSURE SENSOR/CARDIOMEMS;  Surgeon: Mardell Shade, MD;  Location: [facility];  Service: Cardiovascular;  Laterality: N/A;   RIGHT HEART CATH N/A 08/05/2021   Procedure: RIGHT HEART CATH;  Surgeon: Mardell Shade, MD;  Location: [facility];  Service: Cardiovascular;  Laterality: N/A;   RIGHT HEART CATH N/A 12/06/2021   Procedure: RIGHT HEART CATH;  Surgeon:  Mardell Shade, MD;  Location: [facility];  Service: Cardiovascular;  Laterality: N/A;   RIGHT/LEFT HEART CATH AND CORONARY ANGIOGRAPHY N/A 12/03/2020   Procedure: RIGHT/LEFT HEART CATH AND CORONARY ANGIOGRAPHY;  Surgeon: Knox Perl, MD;  Location: [facility];  Service: Cardiovascular;  Laterality: N/A;   VENTRICULOSTOMY  03/28/1997   Social History:  reports that she has never smoked. She has never used smokeless tobacco. She reports that she does not drink alcohol  and does not use drugs.  Allergies  Allergen Reactions   Icosapent  Ethyl (Epa Ethyl Ester) (Fish) Hives   Ketamine Other (See Comments)    "In a vegetative state for 15 minutes," per pt   Maitake Itching and Other (See Comments)    Itchy throat   Morphine  Hives, Itching, Rash and Other (See Comments)    Sore throat, too   Penicillins Hives, Itching and Rash    Has patient had a PCN reaction causing immediate rash, facial/tongue/throat swelling, SOB or lightheadedness with hypotension: Y Has patient had a PCN reaction causing severe rash involving mucus membranes or skin necrosis: Y Has patient had a PCN reaction that required hospitalization: N Has patient had a PCN reaction occurring within the last 10 years: Y   Mushroom Other (See Comments)    Pt has never had mushrooms but tested positive on allergy test   Shellfish Allergy Other (See Comments)    Pt has never had shellfish, but tested positive on allergy test. Pt states contrast in CT is okay.   Fentanyl  Nausea And Vomiting   Prednisone Rash    Family History  Problem Relation Age of Onset   Diabetes Mother    Hypertension Mother    Hyperlipidemia Mother    Thyroid  disease Mother    Hypertension Father    Diabetes Father    Pancreatic cancer Paternal Aunt    Pancreatic cancer Paternal Uncle    Colon cancer Neg Hx    Esophageal cancer Neg Hx    Stomach cancer Neg Hx     Prior to Admission medications   Medication Sig Start Date End Date  Taking? Authorizing Provider  acetaminophen -codeine  (TYLENOL  #3) 300-30 MG tablet Take 1 tablet by mouth every 6 (six) hours as needed for moderate pain (pain score 4-6) or severe pain (pain score 7-10). 06/19/23   [provider]  amitriptyline  (ELAVIL ) 10 MG tablet Take 10 mg by mouth at bedtime.    [provider]  amLODipine  (NORVASC ) 5 MG tablet Take 1 tablet (5 mg total) by mouth daily. 07/10/23 10/08/23  Horace Lye, PA-C  aspirin  EC 81 MG EC tablet Take 1 tablet (81 mg total) by mouth daily. Swallow whole. 12/07/20   Montey Apa, DO  azelastine  (ASTELIN ) 0.1 % nasal spray Place 2 sprays into both nostrils daily at 6 (six) AM.    [provider]  BAQSIMI ONE PACK 3 MG/DOSE POWD Place 1 Dose into the nose once as needed (hypoglycemia). 07/31/23   [provider]  BLISOVI  FE 1.5/30 1.5-30 MG-MCG tablet Take 1 tablet by mouth  daily.    [provider]  butalbital -acetaminophen -caffeine  (FIORICET ) 50-325-40 MG tablet Take 1 tablet by mouth daily as needed for headache. 09/26/22   [provider]  calcitRIOL  (ROCALTROL ) 0.25 MCG capsule Take 0.25 mcg by mouth daily. 08/04/23   [provider]  carvedilol  (COREG ) 6.25 MG tablet Take 1.5 tablets (9.375 mg total) by mouth 2 (two) times daily. 08/07/23   Milford, Arlice Bene, FNP  Continuous Glucose Sensor (DEXCOM G7 SENSOR) MISC Inject 1 Device into the skin See admin instructions. Change device every ten days 06/28/23   [provider]  CRANBERRY CONCENTRATE PO Take 1 tablet by mouth daily at 12 noon.    [provider]  CREON  36000-114000 units CPEP capsule Take 1 capsule (36,000 Units total) by mouth 3 (three) times daily. Patient not taking: Reported on 08/16/2023 05/10/23   Rai, Hurman Maiden, MD  DULoxetine  (CYMBALTA ) 60 MG capsule Take 60 mg by mouth at bedtime. 09/26/22   [provider]  ergocalciferol  (VITAMIN D2) 1.25 MG (50000 UT) capsule Take 50,000 Units  by mouth every 7 (seven) days. Every Sunday    [provider]  fenofibrate  54 MG tablet Take 1 tablet (54 mg total) by mouth daily. Patient taking differently: Take 54 mg by mouth at bedtime. 12/01/22   [name], Aviva Lemmings, MD  Glucose Blood (BLOOD GLUCOSE TEST STRIPS) STRP 1 each by Does not apply route 3 (three) times daily. Use as directed to check blood sugar. May dispense any manufacturer covered by patient's insurance and fits patient's device. 08/26/23   Shalhoub, Merrill Abide, MD  hydrALAZINE  (APRESOLINE ) 50 MG tablet Take 1.5 tablets (75 mg total) by mouth 3 (three) times daily. 07/10/23   Ruddy Corral M, PA-C  insulin  glargine-yfgn (SEMGLEE ) 100 UNIT/ML Pen Inject 44 Units into the skin 2 (two) times daily. May substitute as needed per insurance. 08/26/23   Shalhoub, Merrill Abide, MD  Insulin  Pen Needle (PEN NEEDLES) 31G X 5 MM MISC Use as directed 3 (three) times daily. 08/26/23   Shalhoub, Merrill Abide, MD  ivabradine  (CORLANOR ) 7.5 MG TABS tablet Take 1 tablet (7.5 mg total) by mouth 2 (two) times daily with a meal. Patient taking differently: Take 7.5 mg by mouth at bedtime. 01/31/23   Elmarie Hacking, FNP  Lancet Device MISC 1 each by Does not apply route 3 (three) times daily. May dispense any manufacturer covered by patient's insurance. 08/26/23   Shalhoub, Merrill Abide, MD  Lancets MISC 1 each by Does not apply route 3 (three) times daily. Use as directed to check blood sugar. May dispense any manufacturer covered by patient's insurance and fits patient's device. 08/26/23   Shalhoub, Merrill Abide, MD  levocetirizine (XYZAL ) 5 MG tablet Take 5 mg by mouth every evening. 03/05/22   [provider]  levothyroxine  (SYNTHROID ) 150 MCG tablet Take 150 mcg by mouth daily.    [provider]  metoCLOPramide  (REGLAN ) 5 MG tablet Take 1 tablet (5 mg total) by mouth every 8 (eight) hours as needed for nausea or vomiting. Patient taking differently: Take 5 mg by mouth daily at 12 noon.  05/10/23 05/09/24  Rai, Hurman Maiden, MD  Multiple Vitamin (MULTIVITAMIN WITH MINERALS) TABS tablet Take 1 tablet by mouth daily.    [provider]  oxyCODONE  (OXY IR/ROXICODONE ) 5 MG immediate release tablet Take 1 tablet (5 mg total) by mouth every 6 (six) hours as needed for severe pain (pain score 7-10). 08/26/23   Shalhoub, Merrill Abide, MD  pantoprazole  (  PROTONIX ) 40 MG tablet Take 40 mg by mouth daily. 12/24/19   [provider]  polyethylene glycol (MIRALAX  / GLYCOLAX ) 17 g packet Take 17 g by mouth daily as needed for mild constipation. Also available OTC Patient not taking: Reported on 08/16/2023 05/10/23   Rai, Hurman Maiden, MD  pregabalin  (LYRICA ) 300 MG capsule Take 300 mg by mouth 2 (two) times daily.    [provider]  prochlorperazine  (COMPAZINE ) 10 MG tablet Take 10 mg by mouth at bedtime. Preventative    [provider]  promethazine  (PHENERGAN ) 25 MG tablet Take 1 tablet (25 mg total) by mouth every 6 (six) hours as needed for nausea or vomiting. 08/14/23   Edmonia Gottron, PA-C  REPATHA  SURECLICK 140 MG/ML SOAJ Inject 140 mg into the skin every 14 (fourteen) days. 08/10/23   [provider]  rosuvastatin  (CRESTOR ) 40 MG tablet Take 40 mg by mouth daily. 08/09/21   [provider]  Safety Seal Miscellaneous MISC Hormonic Hair Solution with minoxidil USP 7% and finasteride USP 0.05% - apply to affected areas daily every morning. Patient taking differently: Apply 1 Application topically at bedtime. Hormonic Hair Solution with minoxidil USP 7% and finasteride USP 0.05% - apply to affected areas daily every morning. 06/06/23   Dellar Fenton, DO  sodium bicarbonate  650 MG tablet Take 2 tablets (1,300 mg total) by mouth 2 (two) times daily. 05/10/23   Rai, Hurman Maiden, MD  sucralfate  (CARAFATE ) 1 g tablet Take 1 tablet (1 g total) by mouth with breakfast, with lunch, and with evening meal for 7 days. Patient taking differently: Take 1 g by mouth  daily at 12 noon. 06/18/23 08/16/23  Teddi Favors, DO  SUMAtriptan  (IMITREX ) 100 MG tablet Take 100 mg by mouth every 2 (two) hours as needed for migraine or headache. 08/14/23   [provider]  topiramate  (TOPAMAX ) 50 MG tablet Take 50 mg by mouth 2 (two) times daily.    [provider]  torsemide  (DEMADEX ) 20 MG tablet Take 3 tablets (60 mg total) by mouth daily. 07/10/23   Horace Lye, PA-C    Physical Exam: Vitals:   08/30/23 1729 08/30/23 1800 08/30/23 1939 08/30/23 2300  BP: (!) 163/86 (!) 152/84  (!) 170/88  Pulse: 94 93  90  Resp: 19 18  20   Temp: 98.2 F (36.8 C)  98.4 F (36.9 C)   TempSrc: Oral  Oral   SpO2: 98% 98%  97%  Weight: 64.6 kg     Height: 4\' 9"  (1.448 m)      General:  Appears calm and comfortable and is in NAD Eyes:  PERRL, EOMI, normal lids, iris ENT:  grossly normal hearing, lips & tongue, mmm; appropriate dentition Neck:  no LAD, masses or thyromegaly; no carotid bruits Cardiovascular:  RRR, no m/r/g. No LE edema.  Respiratory:   CTA bilaterally with no wheezes/rales/rhonchi.  Normal respiratory effort. Abdomen:  soft, TTP in bilateral lower quadrants and minimally in epigastric area, ND, NABS Back:   normal alignment, no CVAT Skin:  abscess in right groin, indurated and erythematous.  Musculoskeletal:  grossly normal tone BUE/BLE, good ROM, no bony abnormality. Some muscle wasting of LLL Lower extremity:  No LE edema.  Limited foot exam with no ulcerations.  2+ distal pulses. Psychiatric:  grossly normal mood and affect, speech fluent and appropriate, AOx3 Neurologic:  CN 2-12 grossly intact, moves all extremities in coordinated fashion, sensation intact   Radiological Exams on Admission: Independently reviewed -  see discussion in A/P where applicable  No results found.  EKG: Independently reviewed.  NSR with rate 94; nonspecific ST changes with no evidence of acute ischemia. Prolonged QT    Labs on Admission: I have  personally reviewed the available labs and imaging studies at the time of the admission.  Pertinent labs:   sodium: 128>corrects to 142 glucose: 679,  BUN: 64,  creatinine: 2.39,  AST: 78,  ALT: 105,  alk phos: 150,  AG: 18,  Assessment and Plan: Principal Problem:   Hyperglycemia in setting of T2DM Active Problems:   Abscess   Intractable abdominal pain   Type 2 diabetes mellitus with hyperglycemia, with long-term current use of insulin  (HCC)   Diarrhea   Chronic pancreatitis (HCC)   Orthostatic hypotension   Prolonged QT interval   Elevated liver enzymes   CKD stage 3b, GFR 30-44 ml/min (HCC)   Chronic systolic congestive heart failure (HCC)   Familial hypertriglyceridemia   Essential hypertension   Hypothyroidism   Sleep apnea    Assessment and Plan: * Hyperglycemia in setting of T23DM 33 year old presenting to ED with abdominal pain, groin abscess and uncontrolled sugars found to be hyperglycemic with glucose of >600 in setting of T2DM. NO criteria for DKA or HHS  -obs to stepdown -A1C of 6.1 in 04/2023 -possible multiple causes due to infection (abscess) vs. Change in insulin .vs diarrhea.  Her insulin  was changed at recent hospitalization and she doesn't feel like it's doing as well as her last insulin . Last took insulin  6/3.  -started on hyperglycemia/insulin  drip and sugars now controlled -fluids decreased to 75cc/hour so as not to overload her.  -she has abdominal pain and doesn't want to eat, so will continue insulin  overnight until ready to transition to [facility] and PO intake, especially in setting of abscess -diabetic consult, consider insulin  adjustment  Abscess Right groin abscess with induration s/p I&D. Not draining Possibly source of hyperglycemia Continue rocephin  for now and monitoring Continue sugar control   Intractable abdominal pain -presenting with abdominal pain in epigastric area and lower quadrants. Epigastric pain is chronic, but she states worse  than normal  -exam unimpressive. No rebound or guarding  -no SIRS criteria, but does have diarrhea -low threshold for CT -continue to monitor   Chronic pancreatitis (HCC) -thought secondary to her familial hypertriglyceridemia -lipase normal today, no acute on chronic  -continue with TG control, creon   -follows with GI outpatient  -continue oxycodone  for pain with PRN dilaudid  for severe pain   Diarrhea States having 3-4 episodes of non bloody diarrhea q hour which is new for her ? If any EPI with chronic pancreatitis  Some lower quadrant pain, but no fever/leukocytosis or SIRS criteria Check stool PCR and C.Diff with recent hospitalization Enteric precautions  Consider CT if no improvement in symptoms   Orthostatic hypotension Positive orthostatics on admission ? Dysautonomic vs high dose lasix   TED hose Judicious IVF  Repeat tomorrow   Prolonged QT interval Baseline, Check magnesium  Avoid QT prolonging drugs Keep on tele   Elevated liver enzymes Transaminases appear at baseline  Known hepatic steatosis  Not overloaded on exam  Lifestyle modification Trend   CKD stage 3b, GFR 30-44 ml/min (HCC) CKD 3b: b/l creat 1.7- 2.3 from April 2025 Stable at 2.1  Continue to monitor closely   Familial hypertriglyceridemia Aggressive management of hypertriglyceridemia with fenofibrate  and Crestor   -improved to 555  Chronic systolic congestive heart failure (HCC) No signs/symptoms of volume overload at this time  Judicious fluid, turning down rate so as not to overload her Continue medical management with her coreg , torsemide  and ivabradine   Strict I/O and daily weights   Essential hypertension PRN lopressor  while NPO Resume home medication after transition to [place] insulin    Hypothyroidism TSH abnormal in May 2025 Meds increased during recent hospitalization  Repeat in 6-8 weeks with PCP  Continue synthroid    Sleep apnea Continue cpap at night     Advance  Care Planning:   Code Status: Full Code   Consults: none   DVT Prophylaxis: heparin  [place]   Family Communication: none   Severity of Illness: The appropriate patient status for this patient is OBSERVATION. Observation status is judged to be reasonable and necessary in order to provide the required intensity of service to ensure the patient's safety. The patient's presenting symptoms, physical exam findings, and initial radiographic and laboratory data in the context of their medical condition is felt to place them at decreased risk for further clinical deterioration. Furthermore, it is anticipated that the patient will be medically stable for discharge from the hospital within 2 midnights of admission.   Author: Raymona Caldwell, MD 08/30/2023 11:49 PM  For on call review [email or web address].

## 2023-08-30 NOTE — Assessment & Plan Note (Signed)
-  presenting with abdominal pain in epigastric area and lower quadrants. Epigastric pain is chronic, but she states worse than normal  -exam unimpressive. No rebound or guarding  -no SIRS criteria, but does have diarrhea -low threshold for CT -continue to monitor

## 2023-08-31 DIAGNOSIS — L02214 Cutaneous abscess of groin: Secondary | ICD-10-CM | POA: Diagnosis present

## 2023-08-31 DIAGNOSIS — N1832 Chronic kidney disease, stage 3b: Secondary | ICD-10-CM

## 2023-08-31 DIAGNOSIS — I951 Orthostatic hypotension: Secondary | ICD-10-CM | POA: Diagnosis present

## 2023-08-31 DIAGNOSIS — I10 Essential (primary) hypertension: Secondary | ICD-10-CM

## 2023-08-31 DIAGNOSIS — E871 Hypo-osmolality and hyponatremia: Secondary | ICD-10-CM | POA: Diagnosis present

## 2023-08-31 DIAGNOSIS — E1122 Type 2 diabetes mellitus with diabetic chronic kidney disease: Secondary | ICD-10-CM | POA: Diagnosis present

## 2023-08-31 DIAGNOSIS — L0291 Cutaneous abscess, unspecified: Secondary | ICD-10-CM

## 2023-08-31 DIAGNOSIS — I5022 Chronic systolic (congestive) heart failure: Secondary | ICD-10-CM | POA: Diagnosis present

## 2023-08-31 DIAGNOSIS — R1013 Epigastric pain: Secondary | ICD-10-CM | POA: Diagnosis not present

## 2023-08-31 DIAGNOSIS — E781 Pure hyperglyceridemia: Secondary | ICD-10-CM | POA: Diagnosis present

## 2023-08-31 DIAGNOSIS — I130 Hypertensive heart and chronic kidney disease with heart failure and stage 1 through stage 4 chronic kidney disease, or unspecified chronic kidney disease: Secondary | ICD-10-CM | POA: Diagnosis present

## 2023-08-31 DIAGNOSIS — K861 Other chronic pancreatitis: Secondary | ICD-10-CM | POA: Diagnosis present

## 2023-08-31 DIAGNOSIS — R1084 Generalized abdominal pain: Secondary | ICD-10-CM | POA: Diagnosis not present

## 2023-08-31 DIAGNOSIS — Z880 Allergy status to penicillin: Secondary | ICD-10-CM | POA: Diagnosis not present

## 2023-08-31 DIAGNOSIS — I5043 Acute on chronic combined systolic (congestive) and diastolic (congestive) heart failure: Secondary | ICD-10-CM | POA: Diagnosis not present

## 2023-08-31 DIAGNOSIS — E1165 Type 2 diabetes mellitus with hyperglycemia: Principal | ICD-10-CM

## 2023-08-31 DIAGNOSIS — R197 Diarrhea, unspecified: Secondary | ICD-10-CM | POA: Diagnosis present

## 2023-08-31 DIAGNOSIS — Z794 Long term (current) use of insulin: Secondary | ICD-10-CM | POA: Diagnosis not present

## 2023-08-31 DIAGNOSIS — G4730 Sleep apnea, unspecified: Secondary | ICD-10-CM | POA: Diagnosis present

## 2023-08-31 DIAGNOSIS — R112 Nausea with vomiting, unspecified: Secondary | ICD-10-CM

## 2023-08-31 DIAGNOSIS — E039 Hypothyroidism, unspecified: Secondary | ICD-10-CM

## 2023-08-31 DIAGNOSIS — R110 Nausea: Secondary | ICD-10-CM

## 2023-08-31 DIAGNOSIS — N179 Acute kidney failure, unspecified: Secondary | ICD-10-CM | POA: Diagnosis present

## 2023-08-31 DIAGNOSIS — R748 Abnormal levels of other serum enzymes: Secondary | ICD-10-CM

## 2023-08-31 DIAGNOSIS — N184 Chronic kidney disease, stage 4 (severe): Secondary | ICD-10-CM | POA: Diagnosis present

## 2023-08-31 DIAGNOSIS — K760 Fatty (change of) liver, not elsewhere classified: Secondary | ICD-10-CM | POA: Diagnosis present

## 2023-08-31 DIAGNOSIS — Z888 Allergy status to other drugs, medicaments and biological substances status: Secondary | ICD-10-CM | POA: Diagnosis not present

## 2023-08-31 DIAGNOSIS — R739 Hyperglycemia, unspecified: Secondary | ICD-10-CM | POA: Diagnosis present

## 2023-08-31 DIAGNOSIS — Z885 Allergy status to narcotic agent status: Secondary | ICD-10-CM | POA: Diagnosis not present

## 2023-08-31 DIAGNOSIS — N390 Urinary tract infection, site not specified: Secondary | ICD-10-CM | POA: Diagnosis present

## 2023-08-31 DIAGNOSIS — I482 Chronic atrial fibrillation, unspecified: Secondary | ICD-10-CM | POA: Diagnosis present

## 2023-08-31 DIAGNOSIS — R9431 Abnormal electrocardiogram [ECG] [EKG]: Secondary | ICD-10-CM

## 2023-08-31 DIAGNOSIS — G8929 Other chronic pain: Secondary | ICD-10-CM | POA: Diagnosis present

## 2023-08-31 DIAGNOSIS — L02415 Cutaneous abscess of right lower limb: Secondary | ICD-10-CM | POA: Diagnosis present

## 2023-08-31 DIAGNOSIS — E1142 Type 2 diabetes mellitus with diabetic polyneuropathy: Secondary | ICD-10-CM | POA: Diagnosis present

## 2023-08-31 LAB — CBC
HCT: 34.5 % — ABNORMAL LOW (ref 36.0–46.0)
Hemoglobin: 11.1 g/dL — ABNORMAL LOW (ref 12.0–15.0)
MCH: 29.4 pg (ref 26.0–34.0)
MCHC: 32.2 g/dL (ref 30.0–36.0)
MCV: 91.5 fL (ref 80.0–100.0)
Platelets: 174 10*3/uL (ref 150–400)
RBC: 3.77 MIL/uL — ABNORMAL LOW (ref 3.87–5.11)
RDW: 13.9 % (ref 11.5–15.5)
WBC: 7.7 10*3/uL (ref 4.0–10.5)
nRBC: 0 % (ref 0.0–0.2)

## 2023-08-31 LAB — COMPREHENSIVE METABOLIC PANEL WITH GFR
ALT: 94 U/L — ABNORMAL HIGH (ref 0–44)
AST: 112 U/L — ABNORMAL HIGH (ref 15–41)
Albumin: 4.1 g/dL (ref 3.5–5.0)
Alkaline Phosphatase: 99 U/L (ref 38–126)
Anion gap: 12 (ref 5–15)
BUN: 50 mg/dL — ABNORMAL HIGH (ref 6–20)
CO2: 22 mmol/L (ref 22–32)
Calcium: 9.5 mg/dL (ref 8.9–10.3)
Chloride: 103 mmol/L (ref 98–111)
Creatinine, Ser: 2.08 mg/dL — ABNORMAL HIGH (ref 0.44–1.00)
GFR, Estimated: 32 mL/min — ABNORMAL LOW (ref >60)
Glucose, Bld: 126 mg/dL — ABNORMAL HIGH (ref 70–99)
Potassium: 3.8 mmol/L (ref 3.5–5.1)
Sodium: 137 mmol/L (ref 135–145)
Total Bilirubin: 0.6 mg/dL (ref 0.0–1.2)
Total Protein: 7.3 g/dL (ref 6.5–8.1)

## 2023-08-31 LAB — GLUCOSE, CAPILLARY
Glucose-Capillary: 122 mg/dL — ABNORMAL HIGH (ref 70–99)
Glucose-Capillary: 144 mg/dL — ABNORMAL HIGH (ref 70–99)
Glucose-Capillary: 146 mg/dL — ABNORMAL HIGH (ref 70–99)
Glucose-Capillary: 168 mg/dL — ABNORMAL HIGH (ref 70–99)
Glucose-Capillary: 169 mg/dL — ABNORMAL HIGH (ref 70–99)
Glucose-Capillary: 175 mg/dL — ABNORMAL HIGH (ref 70–99)
Glucose-Capillary: 178 mg/dL — ABNORMAL HIGH (ref 70–99)
Glucose-Capillary: 180 mg/dL — ABNORMAL HIGH (ref 70–99)
Glucose-Capillary: 183 mg/dL — ABNORMAL HIGH (ref 70–99)
Glucose-Capillary: 188 mg/dL — ABNORMAL HIGH (ref 70–99)
Glucose-Capillary: 191 mg/dL — ABNORMAL HIGH (ref 70–99)

## 2023-08-31 LAB — URINE CULTURE

## 2023-08-31 LAB — PROCALCITONIN: Procalcitonin: 0.36 ng/mL

## 2023-08-31 MED ORDER — BUTALBITAL-APAP-CAFFEINE 50-325-40 MG PO TABS: 1.0000 | ORAL_TABLET | Freq: Every day | ORAL | Status: DC | PRN

## 2023-08-31 MED ORDER — METOCLOPRAMIDE HCL 5 MG/ML IJ SOLN
5.0000 mg | Freq: Four times a day (QID) | INTRAMUSCULAR | Status: DC
  Administered 2023-08-31 – 2023-09-03 (×12): 5 mg via INTRAVENOUS
  Filled 2023-08-31 (×12): qty 2

## 2023-08-31 MED ORDER — OXYCODONE HCL 5 MG PO TABS: 5.0000 mg | ORAL_TABLET | Freq: Four times a day (QID) | ORAL | Status: DC | PRN

## 2023-08-31 MED ORDER — ROSUVASTATIN CALCIUM 10 MG PO TABS
10.0000 mg | ORAL_TABLET | Freq: Every day | ORAL | Status: DC
  Administered 2023-08-31 – 2023-09-03 (×4): 10 mg via ORAL
  Filled 2023-08-31 (×4): qty 1

## 2023-08-31 MED ORDER — INSULIN GLARGINE-YFGN 100 UNIT/ML ~~LOC~~ SOLN
40.0000 [IU] | Freq: Two times a day (BID) | SUBCUTANEOUS | Status: DC
  Administered 2023-08-31 – 2023-09-03 (×7): 40 [IU] via SUBCUTANEOUS
  Filled 2023-08-31 (×8): qty 0.4

## 2023-08-31 MED ORDER — TOPIRAMATE 25 MG PO TABS
50.0000 mg | ORAL_TABLET | Freq: Two times a day (BID) | ORAL | Status: DC
  Administered 2023-08-31 – 2023-09-03 (×6): 50 mg via ORAL
  Filled 2023-08-31 (×6): qty 2

## 2023-08-31 MED ORDER — ASPIRIN 81 MG PO TBEC
81.0000 mg | DELAYED_RELEASE_TABLET | Freq: Every day | ORAL | Status: DC
  Administered 2023-08-31 – 2023-09-02 (×3): 81 mg via ORAL
  Filled 2023-08-31 (×3): qty 1

## 2023-08-31 MED ORDER — SODIUM CHLORIDE 0.9 % IV SOLN
INTRAVENOUS | Status: AC
Start: 2023-08-31 — End: 2023-09-01

## 2023-08-31 MED ORDER — DEXTROSE IN LACTATED RINGERS 5 % IV SOLN: INTRAVENOUS | Status: AC

## 2023-08-31 MED ORDER — INSULIN ASPART 100 UNIT/ML IJ SOLN
0.0000 [IU] | INTRAMUSCULAR | Status: DC
  Administered 2023-08-31 (×3): 3 [IU] via SUBCUTANEOUS
  Administered 2023-09-01: 2 [IU] via SUBCUTANEOUS
  Administered 2023-09-01 (×2): 3 [IU] via SUBCUTANEOUS
  Administered 2023-09-02: 2 [IU] via SUBCUTANEOUS
  Administered 2023-09-02: 8 [IU] via SUBCUTANEOUS
  Administered 2023-09-02: 3 [IU] via SUBCUTANEOUS
  Administered 2023-09-02: 5 [IU] via SUBCUTANEOUS
  Administered 2023-09-02: 3 [IU] via SUBCUTANEOUS
  Administered 2023-09-03: 8 [IU] via SUBCUTANEOUS
  Administered 2023-09-03: 2 [IU] via SUBCUTANEOUS
  Administered 2023-09-03: 3 [IU] via SUBCUTANEOUS

## 2023-08-31 MED ORDER — CARVEDILOL 6.25 MG PO TABS
9.3750 mg | ORAL_TABLET | Freq: Two times a day (BID) | ORAL | Status: DC
  Administered 2023-08-31 – 2023-09-03 (×5): 9.375 mg via ORAL
  Filled 2023-08-31 (×6): qty 1

## 2023-08-31 MED ORDER — VITAMIN D (ERGOCALCIFEROL) 1.25 MG (50000 UNIT) PO CAPS
50000.0000 [IU] | ORAL_CAPSULE | ORAL | Status: DC
  Administered 2023-09-03: 50000 [IU] via ORAL
  Filled 2023-08-31: qty 1

## 2023-08-31 MED ORDER — NORETHIN ACE-ETH ESTRAD-FE 1.5-30 MG-MCG PO TABS: 1.0000 | ORAL_TABLET | Freq: Every day | ORAL | Status: DC

## 2023-08-31 MED ORDER — LEVOTHYROXINE SODIUM 150 MCG PO TABS
150.0000 ug | ORAL_TABLET | Freq: Every day | ORAL | Status: DC
  Administered 2023-09-01 – 2023-09-03 (×3): 150 ug via ORAL
  Filled 2023-08-31: qty 2
  Filled 2023-08-31 (×2): qty 1

## 2023-08-31 MED ORDER — PREGABALIN 75 MG PO CAPS
300.0000 mg | ORAL_CAPSULE | Freq: Every day | ORAL | Status: DC
  Administered 2023-08-31 – 2023-09-02 (×3): 300 mg via ORAL
  Filled 2023-08-31 (×3): qty 4

## 2023-08-31 MED ORDER — HYDRALAZINE HCL 50 MG PO TABS
75.0000 mg | ORAL_TABLET | Freq: Three times a day (TID) | ORAL | Status: DC
  Administered 2023-08-31 – 2023-09-02 (×7): 75 mg via ORAL
  Filled 2023-08-31 (×9): qty 1

## 2023-08-31 MED ORDER — IVABRADINE 2.5 MG HALF TABLET
7.5000 mg | ORAL_TABLET | Freq: Two times a day (BID) | ORAL | Status: DC
  Administered 2023-08-31 – 2023-09-03 (×5): 7.5 mg via ORAL
  Filled 2023-08-31 (×8): qty 3

## 2023-08-31 MED ORDER — LORAZEPAM 2 MG/ML IJ SOLN
0.5000 mg | Freq: Once | INTRAMUSCULAR | Status: AC
  Administered 2023-08-31: 0.5 mg via INTRAVENOUS
  Filled 2023-08-31: qty 1

## 2023-08-31 MED ORDER — LEVOCETIRIZINE DIHYDROCHLORIDE 5 MG PO TABS: 5.0000 mg | ORAL_TABLET | Freq: Every day | ORAL | Status: DC

## 2023-08-31 MED ORDER — AMLODIPINE BESYLATE 5 MG PO TABS
5.0000 mg | ORAL_TABLET | Freq: Every day | ORAL | Status: DC
  Administered 2023-08-31 – 2023-09-02 (×3): 5 mg via ORAL
  Filled 2023-08-31 (×4): qty 1

## 2023-08-31 MED ORDER — LORAZEPAM 2 MG/ML IJ SOLN
0.5000 mg | Freq: Four times a day (QID) | INTRAMUSCULAR | Status: DC | PRN
  Administered 2023-08-31: 0.5 mg via INTRAVENOUS
  Filled 2023-08-31: qty 1

## 2023-08-31 MED ORDER — IVABRADINE HCL 7.5 MG PO TABS: 7.5000 mg | ORAL_TABLET | Freq: Two times a day (BID) | ORAL | Status: DC

## 2023-08-31 MED ORDER — LORATADINE 10 MG PO TABS
10.0000 mg | ORAL_TABLET | Freq: Every day | ORAL | Status: DC
  Administered 2023-08-31 – 2023-09-02 (×3): 10 mg via ORAL
  Filled 2023-08-31 (×3): qty 1

## 2023-08-31 MED ORDER — SODIUM BICARBONATE 650 MG PO TABS
1300.0000 mg | ORAL_TABLET | Freq: Two times a day (BID) | ORAL | Status: DC
  Administered 2023-08-31 – 2023-09-03 (×6): 1300 mg via ORAL
  Filled 2023-08-31 (×6): qty 2

## 2023-08-31 MED ORDER — DULOXETINE HCL 30 MG PO CPEP
60.0000 mg | ORAL_CAPSULE | Freq: Every day | ORAL | Status: DC
  Administered 2023-08-31 – 2023-09-02 (×3): 60 mg via ORAL
  Filled 2023-08-31 (×3): qty 2

## 2023-08-31 MED ORDER — PANTOPRAZOLE SODIUM 40 MG IV SOLR
40.0000 mg | INTRAVENOUS | Status: DC
  Administered 2023-08-31 – 2023-09-02 (×3): 40 mg via INTRAVENOUS
  Filled 2023-08-31 (×3): qty 10

## 2023-08-31 MED ORDER — FENOFIBRATE 54 MG PO TABS
54.0000 mg | ORAL_TABLET | Freq: Every day | ORAL | Status: DC
  Administered 2023-08-31 – 2023-09-02 (×3): 54 mg via ORAL
  Filled 2023-08-31 (×3): qty 1

## 2023-08-31 MED ORDER — PANCRELIPASE (LIP-PROT-AMYL) 36000-114000 UNITS PO CPEP
36000.0000 [IU] | ORAL_CAPSULE | Freq: Three times a day (TID) | ORAL | Status: DC
  Administered 2023-08-31 – 2023-09-03 (×8): 36000 [IU] via ORAL
  Filled 2023-08-31 (×10): qty 1

## 2023-08-31 MED ORDER — AMITRIPTYLINE HCL 10 MG PO TABS
10.0000 mg | ORAL_TABLET | Freq: Every day | ORAL | Status: DC
  Administered 2023-08-31 – 2023-09-02 (×3): 10 mg via ORAL
  Filled 2023-08-31 (×3): qty 1

## 2023-08-31 MED ORDER — CALCITRIOL 0.25 MCG PO CAPS
0.2500 ug | ORAL_CAPSULE | Freq: Every day | ORAL | Status: DC
  Administered 2023-08-31 – 2023-09-03 (×4): 0.25 ug via ORAL
  Filled 2023-08-31 (×5): qty 1

## 2023-08-31 NOTE — Plan of Care (Signed)

## 2023-08-31 NOTE — TOC Initial Note (Signed)
 Transition of Care [facility]) - Initial/Assessment Note    Patient Details  Name: Jennifer [name] MRN: [id number] Date of Birth: 26-Oct-1990  Transition of Care [facility]) CM/SW Contact:    Amaryllis Junior, LCSW Phone Number: 08/31/2023, 2:18 PM  Clinical Narrative:                 Pt from home w/ roommates. Will need ROC orders for PT/OT w/ [facility] prior to dc. Pt continues medical workup. TOC following for dc needs.  Expected Discharge Plan: Home w Home Health Services Barriers to Discharge: Continued Medical Work up   Patient Goals and CMS Choice Patient states their goals for this hospitalization and ongoing recovery are:: return home   Choice offered to / list presented to : NA      Expected Discharge Plan and Services In-house Referral: NA Discharge Planning Services: NA   Living arrangements for the past 2 months: Apartment                 DME Arranged: N/A DME Agency: NA                  Prior Living Arrangements/Services Living arrangements for the past 2 months: Apartment Lives with:: Friends Patient language and need for interpreter reviewed:: Yes Do you feel safe going back to the place where you live?: Yes      Need for Family Participation in Patient Care: Yes (Comment) Care giver support system in place?: Yes (comment) Current home services: Home PT, Home OT Criminal Activity/Legal Involvement Pertinent to Current Situation/Hospitalization: No - Comment as needed  Activities of Daily Living   ADL Screening (condition at time of admission) Independently performs ADLs?: Yes (appropriate for developmental age) Is the patient deaf or have difficulty hearing?: No Does the patient have difficulty seeing, even when wearing glasses/contacts?: Yes Does the patient have difficulty concentrating, remembering, or making decisions?: No  Permission Sought/Granted                  Emotional Assessment Appearance:: Appears stated  age Attitude/Demeanor/Rapport: Engaged Affect (typically observed): Accepting Orientation: : Oriented to Self, Oriented to Place, Oriented to  Time, Oriented to Situation Alcohol  / Substance Use: Not Applicable Psych Involvement: No (comment)  Admission diagnosis:  Hyperglycemia [R73.9] Cutaneous abscess of right lower extremity [L02.415] Abdominal pain, unspecified abdominal location [R10.9] Controlled type 2 diabetes mellitus with hyperglycemia (HCC) [E11.65] Patient Active Problem List   Diagnosis Date Noted   Controlled type 2 diabetes mellitus with hyperglycemia (HCC) 08/31/2023   Sleep apnea 08/30/2023   Abscess 08/30/2023   SOB (shortness of breath) 08/20/2023   Dizziness 08/20/2023   Chronic pancreatitis (HCC) 08/15/2023   Witnessed episode of apnea 06/27/2023   Pseudohyponatremia 04/10/2023   Metabolic acidosis 04/10/2023   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 coli UTI (urinary tract infection) 06/01/2022   DKA (diabetic ketoacidosis) (HCC) 06/01/2022   ASCUS of cervix with negative high risk HPV 05/31/2022   Acute on chronic pancreatitis (HCC) 05/29/2022  Hypokalemia 03/23/2022   COVID 03/15/2022   COVID-19 virus infection 03/14/2022   Hyponatremia 03/14/2022   Positive D dimer 03/14/2022   Stage 3b chronic kidney disease (CKD) (HCC) 03/14/2022   Pulmonary hypertension (HCC) 02/06/2022   NASH (nonalcoholic steatohepatitis) 02/06/2022   Obesity (BMI  30-39.9) 02/06/2022   Heart failure (HCC) 12/03/2021   Acute on chronic systolic CHF (congestive heart failure) (HCC) 12/02/2021   Elevated troponin 12/02/2021   Pancytopenia (HCC) 12/02/2021   Amenorrhea 12/02/2021   Hepatic steatosis 09/27/2021   Hyperosmolar hyperglycemic state (HHS) (HCC) 08/26/2021   Small intestinal bacterial overgrowth (SIBO) 08/26/2021   Lower extremity edema 08/26/2021   Hyperkalemia 08/14/2021   Hx of insulin  dependent diabetes mellitus 08/14/2021   CKD stage 3b, GFR 30-44 ml/min (HCC) 05/13/2021   Gastroparesis due to DM (HCC) 05/12/2021   Abdominal distension 05/12/2021   Chest pain 03/10/2021   Acute on chronic combined systolic and diastolic CHF (congestive heart failure) (HCC) 02/09/2021   History of astrocytoma of brain 02/09/2021   Type 2 diabetes mellitus with hyperglycemia, with long-term current use of insulin  (HCC) 01/25/2021   Diarrhea    Elevated transaminase level    Orthostatic hypotension 11/28/2020   Acute combined systolic and diastolic heart failure (HCC)    Nonischemic cardiomyopathy (HCC)    Acute systolic congestive heart failure (HCC)    Elevated liver enzymes    Acute renal failure superimposed on stage 3b chronic kidney disease (HCC) 11/26/2020   Intractable abdominal pain 11/23/2020   Chronic systolic congestive heart failure (HCC) 11/23/2020   Essential hypertension 11/23/2020   Familial hypertriglyceridemia 11/23/2020   Prolonged QT interval 11/23/2020   Finger mass, left 08/12/2020   Nausea and vomiting 07/23/2020   Polycystic ovary syndrome 01/30/2020   Moderate aortic insufficiency 08/16/2019   Moderate mitral regurgitation 08/16/2019   Lipoprotein lipase deficiency, familial 06/19/2019   Microalbuminuria due to type 1 diabetes mellitus (HCC) 06/19/2019   Type 2 diabetes mellitus with hyperlipidemia (HCC) 10/10/2018   Hypothyroidism 10/10/2018   PCP:  Calton Catholic, PA-C Pharmacy:   [facility] - [place], [place] - [address] [place] [address] Phone: [phone] Fax: [phone]     Social Drivers of Health (SDOH) Social History: SDOH Screenings   Food Insecurity: No Food Insecurity (08/31/2023)  Housing: Low Risk  (08/31/2023)  Transportation Needs: No Transportation Needs (08/30/2023)  Utilities: Not At Risk (08/30/2023)  Alcohol  Screen: Low Risk  (12/10/2021)  Depression (PHQ2-9): Low Risk  (12/10/2021)  Financial Resource Strain: Low Risk  (05/31/2023)   Received from [facility]  Physical Activity: Unknown (05/31/2023)   Received from [facility]  Social Connections: Socially Integrated (05/31/2023)   Received from [facility]  Stress: No Stress Concern Present (05/31/2023)   Received from [facility]  Tobacco Use: Low Risk  (08/29/2023)   SDOH Interventions:     Readmission Risk Interventions    08/21/2023    2:55 PM 05/08/2023    4:21 PM 01/04/2023   11:45 AM  Readmission Risk Prevention Plan  Transportation Screening Complete Complete Complete  Medication Review (RN Care Manager) Complete Referral to Pharmacy Complete  PCP or Specialist appointment within 3-5 days of discharge Complete Complete Complete  HRI or Home Care Consult Complete Complete Complete  SW Recovery Care/Counseling Consult Complete Complete Complete  Palliative Care Screening Not Applicable Not Applicable Not Applicable  Skilled Nursing Facility Complete Not Applicable Not Applicable

## 2023-08-31 NOTE — Inpatient Diabetes Management (Signed)
 Inpatient Diabetes Program Recommendations  AACE/ADA: New Consensus Statement on Inpatient Glycemic Control (2015)  Target Ranges:  Prepandial:   less than 140 mg/dL      Peak postprandial:   less than 180 mg/dL (1-2 hours)      Critically ill patients:  140 - 180 mg/dL   Lab Results  Component Value Date   GLUCAP 188 (H) 08/31/2023   HGBA1C 6.1 (H) 05/06/2023    Review of Glycemic Control  Diabetes history: DM 2 Outpatient Diabetes medications:  Humulin  R U-500 300 units qam, 250 units qpm Current orders for Inpatient glycemic control:  Transitioning today Semglee  40 units bid Novolog  0-15 units Q4 hours  Follows with Endocrinology at [facility] with pt at bedside to verify home meds as she is on the concentrated U-500 insulin  at home. Pt insulin  always has to be significantly reduced while in the hospital to Semglee  bid whereas at home she is on a concentrated insulin . Pt reports never getting the semglee  outpatient and resuming her U-500 outpt. Pt also reports at times she still  runs higher with her glucose trends. Pt to follow up with her Endo outpt. Watch trends while here. Agree with insulin  regimen for now.  Thanks,  Eloise Hake RN, MSN, BC-ADM Inpatient Diabetes Coordinator Team Pager [phone] (8a-5p)

## 2023-08-31 NOTE — H&P (View-Only) (Signed)
 Referring Provider: Dr. Hildy Lowers, [facility] Primary Care Physician:  Calton Catholic, PA-C Primary Gastroenterologist:  Dr. Rosaline Coma  Reason for Consultation:  Epigastric abdominal pain  HPI: Jennifer [name] is a 33 y.o. female with medical history significant of hx of astrocytoma, chronic systolic CHF, QT prolongation, lipoprotein deficiency, familial hyperTG, NASH, CKD stage IV, T2DM, HTN, hypothyroidism, gastroparesis, peripheral neuropathy, GERD who presented to ED with complaints of severe generalized abdominal pain and abscess on her leg.   She was just discharged from the hospital on 5/31 after an 11-day hospital stay for hypertriglyceridemia, hyperglycemia, abdominal pain.  Patient tells me that she was diagnosed with pancreatitis, but lipase has been normal and there has been no imaging during that hospitalization.  She has had multiple imaging studies overall though, including an MRI abdomen/MRCP in April that did not show any signs of chronic pancreatitis.  There are notes that a pancreatic fecal elastase were normal in 2023, but I cannot find those results.  Appears that maybe she is on Creon  at home, but only 36,000 units 3 times a day, which is very low dose.  Is on pantoprazole  40 mg daily at home.  Looks like Reglan  was prescribed, but she says she is not taking it now.  Wonder if maybe it was discontinued because of her prolonged QT interval.  She tells me that since the hospitalization in May her abdominal pain has persisted and worsened.  [name] also having nausea and vomiting, unable to keep anything down including liquids.  Very limited historian today, dozing off several times during our conversation/visit.  She actually tells me that she does have epigastric abdominal pain but has pain diffusely all over her abdomen.  MRI abdomen/MRCP 4/49/2025 through [facility]:  IMPRESSION:  1. No significant change in a punctate cystic lesion in the pancreatic tail, potentially an IPMN. See follow-up  guidelines below.  2.  Hepatic steatosis.  3.  Unchanged splenomegaly. Probable iron deposition in the spleen.   Upper EUS 05/2021 Impression: EGD IMPRESSIONS: - Normal esophagus. - Normal stomach. Biopsied. - Normal examined duodenum. Biopsied. EUS IMPRESSIONS: - A cystic lesion was seen in the pancreatic body.  Small side branched cyst versus dilated side branch.  Correlate with MRCP. - No changes of chronic pancreatitis. - The pancreatic duct had a normal endosonographic  appearance in the pancreatic head, neck of the  pancreas and body of the pancreas. The duct in the  tail was not well visualized. - Trace ascites was found on endosonographic  examination of the peritoneal cavity. - The left lobe of the liver revealed no mass or  lesion. - The region of the celiac artery was normal.   EGD 01/08/20: Findings:  All observed locations appeared normal, including the esophagus, stomach  and duodenum. The esophagus, stomach and duodenum appeared normal.  Biopsies obtained of the antrum, incisura, lesser/greater curvature to  assess for H. Pylori.  Biopsies obtained of the duodenal bulb and descending duodenum to assess  for celiac disease.   GES in May 2022 showed delayed gastric emptying.   Past Medical History:  Diagnosis Date   Afib (HCC) 05/12/2021   Brain tumor (HCC) 03/29/1995   astrocytoma   CHF (congestive heart failure) (HCC)    Cholesterosis    CKD (chronic kidney disease) stage 4, GFR 15-29 ml/min (HCC) 05/13/2021   DM (diabetes mellitus) (HCC) 10/10/2018   Fatty liver    Gastroparesis due to DM (HCC) 05/12/2021   HTN (hypertension) 10/10/2018   Hypothyroidism 10/10/2018  Lipoprotein deficiency    Lung disease    longevity long term   Pancreatitis    Polycystic ovary syndrome     Past Surgical History:  Procedure Laterality Date   ABDOMINAL SURGERY     pt states during miscarriage got her intestine   BRAIN SURGERY     EYE MUSCLE SURGERY Right  03/28/2014   PRESSURE SENSOR/CARDIOMEMS N/A 12/06/2021   Procedure: PRESSURE SENSOR/CARDIOMEMS;  Surgeon: Mardell Shade, MD;  Location: [facility];  Service: Cardiovascular;  Laterality: N/A;   RIGHT HEART CATH N/A 08/05/2021   Procedure: RIGHT HEART CATH;  Surgeon: Mardell Shade, MD;  Location: [facility];  Service: Cardiovascular;  Laterality: N/A;   RIGHT HEART CATH N/A 12/06/2021   Procedure: RIGHT HEART CATH;  Surgeon: Mardell Shade, MD;  Location: [facility];  Service: Cardiovascular;  Laterality: N/A;   RIGHT/LEFT HEART CATH AND CORONARY ANGIOGRAPHY N/A 12/03/2020   Procedure: RIGHT/LEFT HEART CATH AND CORONARY ANGIOGRAPHY;  Surgeon: Knox Perl, MD;  Location: [facility];  Service: Cardiovascular;  Laterality: N/A;   VENTRICULOSTOMY  03/28/1997    Prior to Admission medications   Medication Sig Start Date End Date Taking? Authorizing Provider  amitriptyline  (ELAVIL ) 10 MG tablet Take 10 mg by mouth at bedtime.   Yes [provider]  amLODipine  (NORVASC ) 5 MG tablet Take 1 tablet (5 mg total) by mouth daily. 07/10/23 10/08/23 Yes Simmons, Brittainy M, PA-C  aspirin  EC 81 MG EC tablet Take 1 tablet (81 mg total) by mouth daily. Swallow whole. Patient taking differently: Take 81 mg by mouth daily in the afternoon. Swallow whole. 12/07/20  Yes Darus Engels A, DO  azelastine  (ASTELIN ) 0.1 % nasal spray Place 2 sprays into both nostrils daily as needed for rhinitis or allergies.   Yes [provider]  BAQSIMI ONE PACK 3 MG/DOSE POWD Place 1 Dose into the nose once as needed (hypoglycemia). 07/31/23  Yes [provider]  BLISOVI  FE 1.5/30 1.5-30 MG-MCG tablet Take 1 tablet by mouth daily.   Yes [provider]  butalbital -acetaminophen -caffeine  (FIORICET ) 50-325-40 MG tablet Take 1 tablet by mouth daily as needed for headache. 09/26/22  Yes [provider]  calcitRIOL  (ROCALTROL ) 0.25 MCG capsule Take 0.25 mcg  by mouth daily. 08/04/23  Yes [provider]  carvedilol  (COREG ) 6.25 MG tablet Take 1.5 tablets (9.375 mg total) by mouth 2 (two) times daily. 08/07/23  Yes Milford, Arlice Bene, FNP  Continuous Glucose Sensor (DEXCOM G7 SENSOR) MISC Inject 1 Device into the skin See admin instructions. Place 1 new sensor into the skin every 10 days 06/28/23  Yes [provider]  CRANBERRY CONCENTRATE PO Take 1 tablet by mouth daily at 12 noon.   Yes [provider]  CREON  36000-114000 units CPEP capsule Take 1 capsule (36,000 Units total) by mouth 3 (three) times daily. 05/10/23  Yes Rai, Ripudeep K, MD  DULoxetine  (CYMBALTA ) 60 MG capsule Take 60 mg by mouth at bedtime. 09/26/22  Yes [provider]  ergocalciferol  (VITAMIN D2) 1.25 MG (50000 UT) capsule Take 50,000 Units by mouth every Sunday.   Yes [provider]  fenofibrate  54 MG tablet Take 1 tablet (54 mg total) by mouth daily. Patient taking differently: Take 54 mg by mouth at bedtime. 12/01/22  Yes [name], Aviva Lemmings, MD  HUMALOG  KWIKPEN 100 UNIT/ML KwikPen Inject 60-95 Units into the skin See admin instructions. Inject 60-95 units into the skin three times a day  before meals, per sliding scale   Yes [provider]  HUMULIN  R U-500 KWIKPEN 500 UNIT/ML KwikPen Inject 195-300 Units into the skin See admin instructions. 195 am bef breakfast300 hs   Yes [provider]  hydrALAZINE  (APRESOLINE ) 50 MG tablet Take 1.5 tablets (75 mg total) by mouth 3 (three) times daily. 07/10/23  Yes Ruddy Corral M, PA-C  ivabradine  (CORLANOR ) 7.5 MG TABS tablet Take 1 tablet (7.5 mg total) by mouth 2 (two) times daily with a meal. Patient taking differently: Take 7.5 mg by mouth in the morning and at bedtime. 01/31/23  Yes Milford, Arlice Bene, FNP  levocetirizine (XYZAL ) 5 MG tablet Take 5 mg by mouth at bedtime. 03/05/22  Yes [provider]  levothyroxine  (SYNTHROID ) 150 MCG tablet Take 150 mcg by mouth daily  before breakfast.   Yes [provider]  oxyCODONE  (OXY IR/ROXICODONE ) 5 MG immediate release tablet Take 1 tablet (5 mg total) by mouth every 6 (six) hours as needed for severe pain (pain score 7-10). 08/26/23  Yes Shalhoub, Merrill Abide, MD  pantoprazole  (PROTONIX ) 40 MG tablet Take 40 mg by mouth daily before breakfast. 12/24/19  Yes [provider]  polyethylene glycol (MIRALAX  / GLYCOLAX ) 17 g packet Take 17 g by mouth daily as needed for mild constipation. Also available OTC 05/10/23  Yes Rai, Ripudeep K, MD  pregabalin  (LYRICA ) 300 MG capsule Take 300 mg by mouth at bedtime.   Yes [provider]  prochlorperazine  (COMPAZINE ) 10 MG tablet Take 10 mg by mouth at bedtime. Preventative   Yes [provider]  promethazine  (PHENERGAN ) 25 MG tablet Take 1 tablet (25 mg total) by mouth every 6 (six) hours as needed for nausea or vomiting. 08/14/23  Yes Edmonia Gottron, PA-C  REPATHA  SURECLICK 140 MG/ML SOAJ Inject 140 mg into the skin every 14 (fourteen) days. 08/10/23  Yes [provider]  sodium bicarbonate  650 MG tablet Take 2 tablets (1,300 mg total) by mouth 2 (two) times daily. 05/10/23  Yes Rai, Ripudeep K, MD  topiramate  (TOPAMAX ) 50 MG tablet Take 50 mg by mouth 2 (two) times daily.   Yes [provider]  torsemide  (DEMADEX ) 20 MG tablet Take 3 tablets (60 mg total) by mouth daily. 07/10/23  Yes Ruddy Corral M, PA-C  TYLENOL  500 MG tablet Take 500-1,000 mg by mouth every 6 (six) hours as needed for mild pain (pain score 1-3) (or headaches).   Yes [provider]  Glucose Blood (BLOOD GLUCOSE TEST STRIPS) STRP 1 each by Does not apply route 3 (three) times daily. Use as directed to check blood sugar. May dispense any manufacturer covered by patient's insurance and fits patient's device. 08/26/23   Shalhoub, Merrill Abide, MD  insulin  glargine-yfgn (SEMGLEE ) 100 UNIT/ML Pen Inject 44 Units into the skin 2 (two) times daily. May substitute as  needed per insurance. Patient not taking: Reported on 08/30/2023 08/26/23   True Fuss, MD  Insulin  Pen Needle (PEN NEEDLES) 31G X 5 MM MISC Use as directed 3 (three) times daily. 08/26/23   Shalhoub, Merrill Abide, MD  Lancet Device MISC 1 each by Does not apply route 3 (three) times daily. May dispense any manufacturer covered by patient's insurance. 08/26/23   Shalhoub, Merrill Abide, MD  Lancets MISC 1 each by Does not apply route 3 (three) times daily. Use as directed to check blood sugar. May dispense any manufacturer covered by patient's insurance and fits patient's device. 08/26/23   Shalhoub, Merrill Abide, MD  metoCLOPramide  (REGLAN ) 5 MG tablet Take 1 tablet (5 mg total) by mouth every 8 (eight) hours as needed for nausea or vomiting. Patient not taking: Reported on 08/30/2023 05/10/23 05/09/24  Loma Rising, MD  Multiple Vitamin (MULTIVITAMIN WITH MINERALS) TABS tablet Take 1 tablet by mouth daily.    [provider]  Safety Seal Miscellaneous MISC Hormonic Hair Solution with minoxidil USP 7% and finasteride USP 0.05% - apply to affected areas daily every morning. Patient taking differently: Apply 1 Application topically at bedtime. Hormonic Hair Solution with minoxidil USP 7% and finasteride USP 0.05% - apply to affected areas daily every morning. 06/06/23   Dellar Fenton, DO  sucralfate  (CARAFATE ) 1 g tablet Take 1 tablet (1 g total) by mouth with breakfast, with lunch, and with evening meal for 7 days. Patient not taking: Reported on 08/30/2023 06/18/23 08/30/23  Teddi Favors, DO    Current Facility-Administered Medications  Medication Dose Route Frequency Provider Last Rate Last Admin   0.9 %  sodium chloride  infusion   Intravenous Continuous Armenta Landau, MD       cefTRIAXone  (ROCEPHIN ) 2 g in sodium chloride  0.9 % 100 mL IVPB  2 g Intravenous Q24H Raymona Caldwell, MD   Stopped at 08/31/23 1308   Chlorhexidine  Gluconate Cloth 2 % PADS 6 each  6 each Topical Daily Raymona Caldwell, MD    6 each at 08/30/23 1728   dextrose  5 % in lactated ringers  infusion   Intravenous Continuous Daniels, James K, NP 75 mL/hr at 08/31/23 0600 Infusion Verify at 08/31/23 0600   dextrose  50 % solution 0-50 mL  0-50 mL Intravenous PRN Trish Furl, MD       heparin  injection 5,000 Units  5,000 Units Subcutaneous Q8H Raymona Caldwell, MD   5,000 Units at 08/31/23 0448   HYDROmorphone  (DILAUDID ) injection 0.5 mg  0.5 mg Intravenous Q3H PRN Raymona Caldwell, MD   0.5 mg at 08/31/23 0903   hydrOXYzine (ATARAX) tablet 25 mg  25 mg Oral TID PRN Raymona Caldwell, MD   25 mg at 08/31/23 0448   insulin  aspart (novoLOG ) injection 0-15 Units  0-15 Units Subcutaneous Q4H Armenta Landau, MD       insulin  glargine-yfgn (SEMGLEE ) injection 40 Units  40 Units Subcutaneous BID Armenta Landau, MD   40 Units at 08/31/23 1005   insulin  regular, human (MYXREDLIN ) 100 units/ 100 mL infusion   Intravenous Continuous Trish Furl, MD   Stopped at 08/31/23 0447   metoprolol  tartrate (LOPRESSOR ) injection 5 mg  5 mg Intravenous Q6H PRN Raymona Caldwell, MD   5 mg at 08/31/23 0449   Oral care mouth rinse  15 mL Mouth Rinse PRN Raymona Caldwell, MD       oxyCODONE  (Oxy IR/ROXICODONE ) immediate release tablet 5 mg  5 mg Oral Q4H PRN Raymona Caldwell, MD   5 mg at 08/31/23 0857    Allergies as of 08/29/2023 - Reviewed 08/29/2023  Allergen Reaction Noted   Icosapent  ethyl (epa ethyl ester) (fish) Hives 04/05/2023   Ketamine Other (See Comments) 11/22/2020   [place] Itching 02/08/2021   Morphine  Hives, Itching, Rash, and Other (See Comments) 12/24/2019   Penicillins Hives, Itching, and Rash 11/25/2017   Mushroom Other (See Comments) 08/16/2023   Shellfish allergy Other (See Comments) 11/25/2017   Fentanyl  Nausea And Vomiting 01/25/2021   Prednisone Rash 03/26/2014    Family History  Problem Relation Age of Onset   Diabetes Mother    Hypertension Mother  Hyperlipidemia Mother    Thyroid  disease Mother    Hypertension Father     Diabetes Father    Pancreatic cancer Paternal Aunt    Pancreatic cancer Paternal Uncle    Colon cancer Neg Hx    Esophageal cancer Neg Hx    Stomach cancer Neg Hx     Social History   Socioeconomic History   Marital status: Divorced    Spouse name: Not on file   Number of children: 0   Years of education: Not on file   Highest education level: Not on file  Occupational History   Occupation: Dance movement psychotherapist   Occupation: N/A  Tobacco Use   Smoking status: Never   Smokeless tobacco: Never  Vaping Use   Vaping status: Never Used  Substance and Sexual Activity   Alcohol  use: Never   Drug use: Never   Sexual activity: Yes  Other Topics Concern   Not on file  Social History Narrative   Not on file   Social Drivers of Health   Financial Resource Strain: Low Risk  (05/31/2023)   Received from [facility]   Overall Financial Resource Strain (CARDIA)    Difficulty of Paying Living Expenses: Not hard at all  Food Insecurity: No Food Insecurity (08/31/2023)   Hunger Vital Sign    Worried About Running Out of Food in the Last Year: Never true    Ran Out of Food in the Last Year: Never true  Transportation Needs: No Transportation Needs (08/30/2023)   PRAPARE - Administrator, Civil Service (Medical): No    Lack of Transportation (Non-Medical): No  Physical Activity: Unknown (05/31/2023)   Received from [facility]   Exercise Vital Sign    Days of Exercise per Week: Patient declined    Minutes of Exercise per Session: 0 min  Stress: No Stress Concern Present (05/31/2023)   Received from [facility] - Occupational Stress Questionnaire    Feeling of Stress : Not at all  Social Connections: Socially Integrated (05/31/2023)   Received from [facility]   Social Network    How would you rate your social network (family, work, friends)?: Good participation with social networks  Intimate Partner Violence: Not At Risk (08/31/2023)    Humiliation, Afraid, Rape, and Kick questionnaire    Fear of Current or Ex-Partner: No    Emotionally Abused: No    Physically Abused: No    Sexually Abused: No    Review of Systems: ROS is O/W negative except as mentioned in HPI.  Physical Exam: Vital signs in last 24 hours: Temp:  [98.1 F (36.7 C)-98.9 F (37.2 C)] 98.7 F (37.1 C) (06/05 0800) Pulse Rate:  [76-104] 76 (06/05 0800) Resp:  [14-32] 26 (06/05 0800) BP: (101-233)/(65-107) 161/86 (06/05 0800) SpO2:  [94 %-100 %] 97 % (06/05 0800) Weight:  [64.6 kg] 64.6 kg (06/04 1729) Last BM Date : 08/30/23 General:  Alert but sleepy, kept dozing off during out conversation.  Otherwise pleasant and cooperative in NAD Head:  Normocephalic and atraumatic. Eyes:  Sclera clear, no icterus.  Conjunctiva pink. Ears:  Normal auditory acuity. Mouth:  No deformity or lesions.   Lungs:  Clear throughout to auscultation.  No wheezes, crackles, or rhonchi.  Heart:  Regular rate and rhythm; no murmurs, clicks, rubs, or gallops. Abdomen:  Soft, non-distended.  BS present.  Minimal TTP in the epigastrium.   Msk:  Symmetrical without gross deformities. Pulses:  Normal  pulses noted. Extremities:  Without clubbing or edema. Neurologic:  Alert and oriented x 4;  grossly normal neurologically. Skin:  Intact without significant lesions or rashes. Psych:  Alert and cooperative. Normal mood and affect.  Intake/Output from previous day: 06/04 0701 - 06/05 0700 In: 1592.9 [I.V.:1492.9; IV Piggyback:100] Out: -   Lab Results: Recent Labs    08/29/23 2048 08/29/23 2304 08/30/23 1936 08/31/23 0329  WBC 10.4  --  7.4 7.7  HGB 12.2 12.6 11.0* 11.1*  HCT 36.4 37.0 34.4* 34.5*  PLT 200  --  171 174   BMET Recent Labs    08/30/23 0341 08/30/23 1936 08/31/23 0329  NA 134* 136 137  K 4.5 4.3 3.8  CL 95* 103 103  CO2 24 23 22   GLUCOSE 325* 147* 126*  BUN 63* 57* 50*  CREATININE 2.24* 2.11* 2.08*  CALCIUM  9.9 9.5 9.5   LFT Recent  Labs    08/31/23 0329  PROT 7.3  ALBUMIN  4.1  AST 112*  ALT 94*  ALKPHOS 99  BILITOT 0.6   IMPRESSION:  Epigastric pain with nausea and vomiting:  Likely related to her gastroparesis, had abnormal GES in May 2022 at [place].  Had been on pantoprazole  40 mg daily at home.    IPMN: CT scan and MRCP in April did not show any chronic pancreatitis changes.  Plan for follow-up imaging in a year.  Chronic kidney disease stage 3-4  Elevated LFTs: Have been elevated for about the past 2 months.  Has hepatic steatosis/MAFLD, but also had extensive lab workup as an outpatient through our office.  DM:  Had blood sugar of 679 on 6/3.  Familial hypertriglyceridemia:  Triglycerides had been over 1K, down to 555 yesterday.  CHF:  EF 45-50%  Prolonged QT interval  Leg abscess:  On abx.  This was the primary reason that she came to the hospital on this occasion.  PLAN: -Will start pantoprazole  40 mg IV daily. -EGD 6/6 to rule out esophagitis, ulcer, etc. -Needs better blood sugar control. -Don't know that reglan  is an option with her prolonged QT. -Stool studies were ordered for complaints of diarrhea, but she has not had any bowel movement since she has been admitted.  I am going to discontinue those orders for now.   Martina Sledge. Zehr  08/31/2023, 11:46 AM  GI ATTENDING  History, laboratories, x-rays, endoscopy reports previous reviewed.  Patient seen and examined.  Agree with comprehensive consultation note as outlined above.  We are asked to see this patient with multiple significant problems abdominal pain.  Admitted to the hospital with leg abscess.  Abdominal complaints are chronic.  Despite reports of such, no objective evidence for pancreatitis, despite extensive in-depth evaluations.  No objective GI cause for her pain has been identified.  Agree with upper endoscopy to further evaluate for possible GI cause of pain.  Patient is higher than baseline risk due to her comorbidities. The  nature of the procedure, as well as the risks, benefits, and alternatives were carefully and thoroughly reviewed with the patient. Ample time for discussion and questions allowed. The patient understood, was satisfied, and agreed to proceed. In the meantime, primary service to continue to address her multiple other acute and chronic problems.  Murel Arlington. Willey Harrier., M.D. [facility] Division of Gastroenterology

## 2023-08-31 NOTE — Progress Notes (Addendum)
 PROGRESS NOTE    Jennifer [name]  MWN:[id number] DOB: 12-12-1990 DOA: 08/29/2023 PCP: Calton Catholic, PA-C    Chief Complaint  Patient presents with   Abdominal Pain   Abscess    Brief Narrative:  Patient 33 year old female history of astrocytoma, chronic systolic CHF, QT prolongation, lipoprotein deficiency, familial hypertriglyceridemia, NASH, CKD stage IV, type 2 diabetes, hypertension, hypothyroidism, chronic pancreatitis, gastroparesis, peripheral neuropathy, GERD presented with complaints of severe generalized abdominal pain as well as abscess on her right upper thigh.  Patient seen in the ED, I&D of the abscess done in the ED.  Patient with ongoing epigastric abdominal pain, noted to have some nausea.  Patient admitted.   Assessment & Plan:   Principal Problem:   Hyperglycemia in setting of T2DM Active Problems:   Abscess   Intractable abdominal pain   Type 2 diabetes mellitus with hyperglycemia, with long-term current use of insulin  (HCC)   Diarrhea   Chronic pancreatitis (HCC)   Orthostatic hypotension   Prolonged QT interval   Elevated liver enzymes   CKD stage 3b, GFR 30-44 ml/min (HCC)   Chronic systolic congestive heart failure (HCC)   Familial hypertriglyceridemia   Essential hypertension   Hypothyroidism   Sleep apnea   Controlled type 2 diabetes mellitus with hyperglycemia (HCC)   Nausea without vomiting   Epigastric pain   #1  Hyperglycemia in the setting of type 2 diabetes -Patient presented to the ED with abdominal pain, groin abscess, uncontrolled sugars found to be hyperglycemic with glucose levels in the 600s in the setting of type 2 diabetes. - Patient did not meet criteria for DKA. - Hemoglobin A1c 6.1 04/2023. - Etiology of hyperglycemia could be due to infection concern for abscess versus UTI and recent change in her insulin  per patient. - Patient noted to have insulin  recently changed during last hospitalization and told admitting physician  feels it was not working as well as her prior insulin . - Patient placed on Endo tool/glucose stabilizer, CBGs improved. - Will transition to Semglee  40 units twice daily. - SSI. - CBGs every 4 hours. - Gentle hydration with normal saline at 75 cc an hour for the next 24 hours. - Continue empiric IV antibiotics for UTI and possible groin abscess. - Placed on clear liquid diet and advance as tolerated.  2.  Epigastric/upper abdominal pain -Patient with complaints of epigastric abdominal pain worse than her baseline chronic abdominal pain feels possibly may be due to acute on chronic pancreatitis. - Lipase levels within normal limits. - Place patient on clear liquids. - Place on Reglan  5 mg IV every 6 hours. - Gentle hydration for the next 24 hours. - Due to history of CHF and CKD will need to be judicious with IV fluids. - Resume home regimen Creon . - Continue IV PPI daily. - Consult with GI for further evaluation and management.  3.  Right groin abscess -Status post I&D in the ED with some drainage. - Warm compresses. - CBG management. - Continue empiric IV Rocephin .  4.  UTI -Urine cultures pending. - Continue IV Rocephin .  5.  Chronic pancreatitis -Secondary to familial hypertriglyceridemia. - Lipase levels normalized. - Resume home regimen fenofibrate , Crestor , Creon . - Oxycodone  as needed for pain. - IV Dilaudid  as needed for severe pain. - GI consult due to ongoing abdominal/epigastric pain.  6.  Diarrhea -Prior to mission patient noted to have 3-4 episodes of nonbloody diarrhea per hour which was felt to be new for the patient. - Patient afebrile, no  leukocytosis. - Patient states diarrhea has resolved has no further diarrhea. - Stool studies ordered and pending.  7.  Orthostatic hypotension -Patient noted to be orthostatic on admission. - Continue to hold diuretics. - TED hose.  8.  Hypertension -BP elevated. - Resume home regimen Norvasc , Coreg ,  hydralazine . - Hold Demadex .  9.  Hypothyroidism -Synthroid  dose recently adjusted. -Resume home regimen Synthroid . - Outpatient follow-up.  10.  History of prolonged QT interval -At baseline. - Keep potassium approximately 4, magnesium  approximately 2.  11.  Transaminitis -Follow. - Outpatient follow-up.  12.  CKD stage IIIb -Baseline creatinine approximately 1.7-2.3. - Stable.  13.  Chronic systolic CHF -No signs of volume overloaded noted on examination. - Judicious IV fluid use. - Due to presentation with orthostasis we will continue to hold diuretics for now. - Resume home regimen Coreg , Norvasc , hydralazine .  14.  Sleep apnea -CPAP nightly.   DVT prophylaxis: Heparin  Code Status: Full Family Communication: Updated patient.  No family at bedside. Disposition: Home when clinically improved.  Status is: Inpatient The patient will require care spanning > 2 midnights and should be moved to inpatient because: Severity of illness   Consultants:  GI pending  Procedures:  None  Antimicrobials:  Anti-infectives (From admission, onward)    Start     Dose/Rate Route Frequency Ordered Stop   08/31/23 0200  cefTRIAXone  (ROCEPHIN ) 2 g in sodium chloride  0.9 % 100 mL IVPB        2 g 200 mL/hr over 30 Minutes Intravenous Every 24 hours 08/30/23 1907     08/30/23 0200  cefTRIAXone  (ROCEPHIN ) 2 g in sodium chloride  0.9 % 100 mL IVPB        2 g 200 mL/hr over 30 Minutes Intravenous  Once 08/30/23 0153 08/30/23 0246         Subjective: Patient sitting up in bed.  Still with complaints of acute on chronic epigastric abdominal pain.  Stated since she last left the hospital with worsening epigastric abdominal pain worse with oral intake.  Patient with some emesis this morning.  No hematemesis..  Denies any further diarrhea.  Started on clear liquids. RN at bedside during exam.  Objective: Vitals:   08/31/23 0600 08/31/23 0800 08/31/23 1000 08/31/23 1200  BP: (!) 163/87  (!) 161/86 (!) 177/88   Pulse: 86 76 81   Resp: 20 (!) 26 17   Temp:  98.7 F (37.1 C)  98.1 F (36.7 C)  TempSrc:  Oral  Oral  SpO2: 94% 97% 96%   Weight:      Height:        Intake/Output Summary (Last 24 hours) at 08/31/2023 1519 Last data filed at 08/31/2023 1452 Gross per 24 hour  Intake 1528.6 ml  Output 300 ml  Net 1228.6 ml   Filed Weights   08/29/23 2035 08/30/23 1729  Weight: 63.5 kg 64.6 kg    Examination:  General exam: Appears calm and comfortable  Respiratory system: Clear to auscultation. Respiratory effort normal. Cardiovascular system: S1 & S2 heard, RRR. No JVD, murmurs, rubs, gallops or clicks. No pedal edema. Gastrointestinal system: Abdomen is nondistended, soft and tender to palpation in the epigastrium.  Positive bowel sounds.  No rebound.  No guarding.  Central nervous system: Alert and oriented. No focal neurological deficits. Extremities: Right upper thigh with indurated area, some discharge when palpated and some tenderness to palpation.  Skin: No rashes, lesions or ulcers Psychiatry: Judgement and insight appear normal. Mood & affect appropriate.  Data Reviewed: I have personally reviewed following labs and imaging studies  CBC: Recent Labs  Lab 08/29/23 2048 08/29/23 2304 08/30/23 1936 08/31/23 0329  WBC 10.4  --  7.4 7.7  NEUTROABS  --   --  3.7  --   HGB 12.2 12.6 11.0* 11.1*  HCT 36.4 37.0 34.4* 34.5*  MCV 88.8  --  93.0 91.5  PLT 200  --  171 174    Basic Metabolic Panel: Recent Labs  Lab 08/26/23 0439 08/26/23 0440 08/29/23 2048 08/29/23 2304 08/30/23 0341 08/30/23 1936 08/31/23 0329  NA 133* 132* 128* 127* 134* 136 137  K 4.0 4.0 4.9 5.3* 4.5 4.3 3.8  CL 90* 89* 89*  --  95* 103 103  CO2 30 29 21*  --  24 23 22   GLUCOSE 269* 261* 679*  --  325* 147* 126*  BUN 64* 66* 64*  --  63* 57* 50*  CREATININE 2.53* 2.51* 2.39*  --  2.24* 2.11* 2.08*  CALCIUM  9.7 9.6 10.1  --  9.9 9.5 9.5  MG 2.4  --   --   --   --  2.1   --   PHOS  --  4.8*  --   --   --   --   --     GFR: Estimated Creatinine Clearance: 30 mL/min (A) (by C-G formula based on SCr of 2.08 mg/dL (H)).  Liver Function Tests: Recent Labs  Lab 08/26/23 0440 08/29/23 2048 08/31/23 0329  AST  --  78* 112*  ALT  --  105* 94*  ALKPHOS  --  150* 99  BILITOT  --  0.5 0.6  PROT  --  8.6* 7.3  ALBUMIN  4.0 5.0 4.1    CBG: Recent Labs  Lab 08/31/23 0633 08/31/23 0727 08/31/23 0834 08/31/23 1001 08/31/23 1055  GLUCAP 175* 180* 168* 183* 188*     No results found for this or any previous visit (from the past 240 hours).       Radiology Studies: No results found.      Scheduled Meds:  amitriptyline   10 mg Oral QHS   amLODipine   5 mg Oral Daily   aspirin  EC  81 mg Oral Q1500   calcitRIOL   0.25 mcg Oral Daily   carvedilol   9.375 mg Oral BID   Chlorhexidine  Gluconate Cloth  6 each Topical Daily   DULoxetine   60 mg Oral QHS   fenofibrate   54 mg Oral QHS   heparin   5,000 Units Subcutaneous Q8H   hydrALAZINE   75 mg Oral TID   insulin  aspart  0-15 Units Subcutaneous Q4H   insulin  glargine-yfgn  40 Units Subcutaneous BID   ivabradine   7.5 mg Oral BID WC   [START ON 09/01/2023] levothyroxine   150 mcg Oral QAC breakfast   lipase/protease/amylase  36,000 Units Oral TID   loratadine   10 mg Oral QHS   metoCLOPramide  (REGLAN ) injection  5 mg Intravenous Q6H   norethindrone -ethinyl estradiol -iron  1 tablet Oral Daily   pantoprazole  (PROTONIX ) IV  40 mg Intravenous Q24H   pregabalin   300 mg Oral QHS   sodium bicarbonate   1,300 mg Oral BID   topiramate   50 mg Oral BID   [START ON 09/03/2023] Vitamin D  (Ergocalciferol )  50,000 Units Oral Q Sun   Continuous Infusions:  sodium chloride  75 mL/hr at 08/31/23 1229   cefTRIAXone  (ROCEPHIN )  IV Stopped (08/31/23 0525)   dextrose  5% lactated ringers  Stopped (08/31/23 1452)   insulin  Stopped (08/31/23 0447)  LOS: 0 days    Time spent: 40 minutes    Hilda Lovings, MD [facility]   To contact the attending provider between 7A-7P or the covering provider during after hours 7P-7A, please log into the web site [email or web address] and access using universal [place] password for that web site. If you do not have the password, please call the hospital operator.  08/31/2023, 3:19 PM

## 2023-08-31 NOTE — Consult Note (Addendum)
 Referring Provider: Dr. Hildy Lowers, [facility] Primary Care Physician:  Calton Catholic, PA-C Primary Gastroenterologist:  Dr. Rosaline Coma  Reason for Consultation:  Epigastric abdominal pain  HPI: Jennifer [name] is a 33 y.o. female with medical history significant of hx of astrocytoma, chronic systolic CHF, QT prolongation, lipoprotein deficiency, familial hyperTG, NASH, CKD stage IV, T2DM, HTN, hypothyroidism, gastroparesis, peripheral neuropathy, GERD who presented to ED with complaints of severe generalized abdominal pain and abscess on her leg.   She was just discharged from the hospital on 5/31 after an 11-day hospital stay for hypertriglyceridemia, hyperglycemia, abdominal pain.  Patient tells me that she was diagnosed with pancreatitis, but lipase has been normal and there has been no imaging during that hospitalization.  She has had multiple imaging studies overall though, including an MRI abdomen/MRCP in April that did not show any signs of chronic pancreatitis.  There are notes that a pancreatic fecal elastase were normal in 2023, but I cannot find those results.  Appears that maybe she is on Creon  at home, but only 36,000 units 3 times a day, which is very low dose.  Is on pantoprazole  40 mg daily at home.  Looks like Reglan  was prescribed, but she says she is not taking it now.  Wonder if maybe it was discontinued because of her prolonged QT interval.  She tells me that since the hospitalization in May her abdominal pain has persisted and worsened.  [name] also having nausea and vomiting, unable to keep anything down including liquids.  Very limited historian today, dozing off several times during our conversation/visit.  She actually tells me that she does have epigastric abdominal pain but has pain diffusely all over her abdomen.  MRI abdomen/MRCP 4/49/2025 through [facility]:  IMPRESSION:  1. No significant change in a punctate cystic lesion in the pancreatic tail, potentially an IPMN. See follow-up  guidelines below.  2.  Hepatic steatosis.  3.  Unchanged splenomegaly. Probable iron deposition in the spleen.   Upper EUS 05/2021 Impression: EGD IMPRESSIONS: - Normal esophagus. - Normal stomach. Biopsied. - Normal examined duodenum. Biopsied. EUS IMPRESSIONS: - A cystic lesion was seen in the pancreatic body.  Small side branched cyst versus dilated side branch.  Correlate with MRCP. - No changes of chronic pancreatitis. - The pancreatic duct had a normal endosonographic  appearance in the pancreatic head, neck of the  pancreas and body of the pancreas. The duct in the  tail was not well visualized. - Trace ascites was found on endosonographic  examination of the peritoneal cavity. - The left lobe of the liver revealed no mass or  lesion. - The region of the celiac artery was normal.   EGD 01/08/20: Findings:  All observed locations appeared normal, including the esophagus, stomach  and duodenum. The esophagus, stomach and duodenum appeared normal.  Biopsies obtained of the antrum, incisura, lesser/greater curvature to  assess for H. Pylori.  Biopsies obtained of the duodenal bulb and descending duodenum to assess  for celiac disease.   GES in May 2022 showed delayed gastric emptying.   Past Medical History:  Diagnosis Date   Afib (HCC) 05/12/2021   Brain tumor (HCC) 03/29/1995   astrocytoma   CHF (congestive heart failure) (HCC)    Cholesterosis    CKD (chronic kidney disease) stage 4, GFR 15-29 ml/min (HCC) 05/13/2021   DM (diabetes mellitus) (HCC) 10/10/2018   Fatty liver    Gastroparesis due to DM (HCC) 05/12/2021   HTN (hypertension) 10/10/2018   Hypothyroidism 10/10/2018  Lipoprotein deficiency    Lung disease    longevity long term   Pancreatitis    Polycystic ovary syndrome     Past Surgical History:  Procedure Laterality Date   ABDOMINAL SURGERY     pt states during miscarriage got her intestine   BRAIN SURGERY     EYE MUSCLE SURGERY Right  03/28/2014   PRESSURE SENSOR/CARDIOMEMS N/A 12/06/2021   Procedure: PRESSURE SENSOR/CARDIOMEMS;  Surgeon: Mardell Shade, MD;  Location: [facility];  Service: Cardiovascular;  Laterality: N/A;   RIGHT HEART CATH N/A 08/05/2021   Procedure: RIGHT HEART CATH;  Surgeon: Mardell Shade, MD;  Location: [facility];  Service: Cardiovascular;  Laterality: N/A;   RIGHT HEART CATH N/A 12/06/2021   Procedure: RIGHT HEART CATH;  Surgeon: Mardell Shade, MD;  Location: [facility];  Service: Cardiovascular;  Laterality: N/A;   RIGHT/LEFT HEART CATH AND CORONARY ANGIOGRAPHY N/A 12/03/2020   Procedure: RIGHT/LEFT HEART CATH AND CORONARY ANGIOGRAPHY;  Surgeon: Knox Perl, MD;  Location: [facility];  Service: Cardiovascular;  Laterality: N/A;   VENTRICULOSTOMY  03/28/1997    Prior to Admission medications   Medication Sig Start Date End Date Taking? Authorizing Provider  amitriptyline  (ELAVIL ) 10 MG tablet Take 10 mg by mouth at bedtime.   Yes [provider]  amLODipine  (NORVASC ) 5 MG tablet Take 1 tablet (5 mg total) by mouth daily. 07/10/23 10/08/23 Yes Simmons, Brittainy M, PA-C  aspirin  EC 81 MG EC tablet Take 1 tablet (81 mg total) by mouth daily. Swallow whole. Patient taking differently: Take 81 mg by mouth daily in the afternoon. Swallow whole. 12/07/20  Yes Darus Engels A, DO  azelastine  (ASTELIN ) 0.1 % nasal spray Place 2 sprays into both nostrils daily as needed for rhinitis or allergies.   Yes [provider]  BAQSIMI ONE PACK 3 MG/DOSE POWD Place 1 Dose into the nose once as needed (hypoglycemia). 07/31/23  Yes [provider]  BLISOVI  FE 1.5/30 1.5-30 MG-MCG tablet Take 1 tablet by mouth daily.   Yes [provider]  butalbital -acetaminophen -caffeine  (FIORICET ) 50-325-40 MG tablet Take 1 tablet by mouth daily as needed for headache. 09/26/22  Yes [provider]  calcitRIOL  (ROCALTROL ) 0.25 MCG capsule Take 0.25 mcg  by mouth daily. 08/04/23  Yes [provider]  carvedilol  (COREG ) 6.25 MG tablet Take 1.5 tablets (9.375 mg total) by mouth 2 (two) times daily. 08/07/23  Yes Milford, Arlice Bene, FNP  Continuous Glucose Sensor (DEXCOM G7 SENSOR) MISC Inject 1 Device into the skin See admin instructions. Place 1 new sensor into the skin every 10 days 06/28/23  Yes [provider]  CRANBERRY CONCENTRATE PO Take 1 tablet by mouth daily at 12 noon.   Yes [provider]  CREON  36000-114000 units CPEP capsule Take 1 capsule (36,000 Units total) by mouth 3 (three) times daily. 05/10/23  Yes Rai, Ripudeep K, MD  DULoxetine  (CYMBALTA ) 60 MG capsule Take 60 mg by mouth at bedtime. 09/26/22  Yes [provider]  ergocalciferol  (VITAMIN D2) 1.25 MG (50000 UT) capsule Take 50,000 Units by mouth every Sunday.   Yes [provider]  fenofibrate  54 MG tablet Take 1 tablet (54 mg total) by mouth daily. Patient taking differently: Take 54 mg by mouth at bedtime. 12/01/22  Yes [name], Aviva Lemmings, MD  HUMALOG  KWIKPEN 100 UNIT/ML KwikPen Inject 60-95 Units into the skin See admin instructions. Inject 60-95 units into the skin three times a day  before meals, per sliding scale   Yes [provider]  HUMULIN  R U-500 KWIKPEN 500 UNIT/ML KwikPen Inject 195-300 Units into the skin See admin instructions. 195 am bef breakfast300 hs   Yes [provider]  hydrALAZINE  (APRESOLINE ) 50 MG tablet Take 1.5 tablets (75 mg total) by mouth 3 (three) times daily. 07/10/23  Yes Simmons, Brittainy M, PA-C  ivabradine  (CORLANOR ) 7.5 MG TABS tablet Take 1 tablet (7.5 mg total) by mouth 2 (two) times daily with a meal. Patient taking differently: Take 7.5 mg by mouth in the morning and at bedtime. 01/31/23  Yes Milford, Arlice Bene, FNP  levocetirizine (XYZAL ) 5 MG tablet Take 5 mg by mouth at bedtime. 03/05/22  Yes [provider]  levothyroxine  (SYNTHROID ) 150 MCG tablet Take 150 mcg by mouth daily  before breakfast.   Yes [provider]  oxyCODONE  (OXY IR/ROXICODONE ) 5 MG immediate release tablet Take 1 tablet (5 mg total) by mouth every 6 (six) hours as needed for severe pain (pain score 7-10). 08/26/23  Yes Shalhoub, Merrill Abide, MD  pantoprazole  (PROTONIX ) 40 MG tablet Take 40 mg by mouth daily before breakfast. 12/24/19  Yes [provider]  polyethylene glycol (MIRALAX  / GLYCOLAX ) 17 g packet Take 17 g by mouth daily as needed for mild constipation. Also available OTC 05/10/23  Yes Rai, Ripudeep K, MD  pregabalin  (LYRICA ) 300 MG capsule Take 300 mg by mouth at bedtime.   Yes [provider]  prochlorperazine  (COMPAZINE ) 10 MG tablet Take 10 mg by mouth at bedtime. Preventative   Yes [provider]  promethazine  (PHENERGAN ) 25 MG tablet Take 1 tablet (25 mg total) by mouth every 6 (six) hours as needed for nausea or vomiting. 08/14/23  Yes Edmonia Gottron, PA-C  REPATHA  SURECLICK 140 MG/ML SOAJ Inject 140 mg into the skin every 14 (fourteen) days. 08/10/23  Yes [provider]  sodium bicarbonate  650 MG tablet Take 2 tablets (1,300 mg total) by mouth 2 (two) times daily. 05/10/23  Yes Rai, Ripudeep K, MD  topiramate  (TOPAMAX ) 50 MG tablet Take 50 mg by mouth 2 (two) times daily.   Yes [provider]  torsemide  (DEMADEX ) 20 MG tablet Take 3 tablets (60 mg total) by mouth daily. 07/10/23  Yes Ruddy Corral M, PA-C  TYLENOL  500 MG tablet Take 500-1,000 mg by mouth every 6 (six) hours as needed for mild pain (pain score 1-3) (or headaches).   Yes [provider]  Glucose Blood (BLOOD GLUCOSE TEST STRIPS) STRP 1 each by Does not apply route 3 (three) times daily. Use as directed to check blood sugar. May dispense any manufacturer covered by patient's insurance and fits patient's device. 08/26/23   Shalhoub, Merrill Abide, MD  insulin  glargine-yfgn (SEMGLEE ) 100 UNIT/ML Pen Inject 44 Units into the skin 2 (two) times daily. May substitute as  needed per insurance. Patient not taking: Reported on 08/30/2023 08/26/23   True Fuss, MD  Insulin  Pen Needle (PEN NEEDLES) 31G X 5 MM MISC Use as directed 3 (three) times daily. 08/26/23   Shalhoub, Merrill Abide, MD  Lancet Device MISC 1 each by Does not apply route 3 (three) times daily. May dispense any manufacturer covered by patient's insurance. 08/26/23   Shalhoub, Merrill Abide, MD  Lancets MISC 1 each by Does not apply route 3 (three) times daily. Use as directed to check blood sugar. May dispense any manufacturer covered by patient's insurance and fits patient's device. 08/26/23   Shalhoub, Merrill Abide, MD  metoCLOPramide  (REGLAN ) 5 MG tablet Take 1 tablet (5 mg total) by mouth every 8 (eight) hours as needed for nausea or vomiting. Patient not taking: Reported on 08/30/2023 05/10/23 05/09/24  Loma Rising, MD  Multiple Vitamin (MULTIVITAMIN WITH MINERALS) TABS tablet Take 1 tablet by mouth daily.    [provider]  Safety Seal Miscellaneous MISC Hormonic Hair Solution with minoxidil USP 7% and finasteride USP 0.05% - apply to affected areas daily every morning. Patient taking differently: Apply 1 Application topically at bedtime. Hormonic Hair Solution with minoxidil USP 7% and finasteride USP 0.05% - apply to affected areas daily every morning. 06/06/23   Dellar Fenton, DO  sucralfate  (CARAFATE ) 1 g tablet Take 1 tablet (1 g total) by mouth with breakfast, with lunch, and with evening meal for 7 days. Patient not taking: Reported on 08/30/2023 06/18/23 08/30/23  Teddi Favors, DO    Current Facility-Administered Medications  Medication Dose Route Frequency Provider Last Rate Last Admin   0.9 %  sodium chloride  infusion   Intravenous Continuous Armenta Landau, MD       cefTRIAXone  (ROCEPHIN ) 2 g in sodium chloride  0.9 % 100 mL IVPB  2 g Intravenous Q24H Raymona Caldwell, MD   Stopped at 08/31/23 1610   Chlorhexidine  Gluconate Cloth 2 % PADS 6 each  6 each Topical Daily Raymona Caldwell, MD    6 each at 08/30/23 1728   dextrose  5 % in lactated ringers  infusion   Intravenous Continuous Daniels, James K, NP 75 mL/hr at 08/31/23 0600 Infusion Verify at 08/31/23 0600   dextrose  50 % solution 0-50 mL  0-50 mL Intravenous PRN Trish Furl, MD       heparin  injection 5,000 Units  5,000 Units Subcutaneous Q8H Raymona Caldwell, MD   5,000 Units at 08/31/23 0448   HYDROmorphone  (DILAUDID ) injection 0.5 mg  0.5 mg Intravenous Q3H PRN Raymona Caldwell, MD   0.5 mg at 08/31/23 0903   hydrOXYzine (ATARAX) tablet 25 mg  25 mg Oral TID PRN Raymona Caldwell, MD   25 mg at 08/31/23 0448   insulin  aspart (novoLOG ) injection 0-15 Units  0-15 Units Subcutaneous Q4H Armenta Landau, MD       insulin  glargine-yfgn (SEMGLEE ) injection 40 Units  40 Units Subcutaneous BID Armenta Landau, MD   40 Units at 08/31/23 1005   insulin  regular, human (MYXREDLIN ) 100 units/ 100 mL infusion   Intravenous Continuous Trish Furl, MD   Stopped at 08/31/23 0447   metoprolol  tartrate (LOPRESSOR ) injection 5 mg  5 mg Intravenous Q6H PRN Raymona Caldwell, MD   5 mg at 08/31/23 0449   Oral care mouth rinse  15 mL Mouth Rinse PRN Raymona Caldwell, MD       oxyCODONE  (Oxy IR/ROXICODONE ) immediate release tablet 5 mg  5 mg Oral Q4H PRN Raymona Caldwell, MD   5 mg at 08/31/23 0857    Allergies as of 08/29/2023 - Reviewed 08/29/2023  Allergen Reaction Noted   Icosapent  ethyl (epa ethyl ester) (fish) Hives 04/05/2023   Ketamine Other (See Comments) 11/22/2020   [place] Itching 02/08/2021   Morphine  Hives, Itching, Rash, and Other (See Comments) 12/24/2019   Penicillins Hives, Itching, and Rash 11/25/2017   Mushroom Other (See Comments) 08/16/2023   Shellfish allergy Other (See Comments) 11/25/2017   Fentanyl  Nausea And Vomiting 01/25/2021   Prednisone Rash 03/26/2014    Family History  Problem Relation Age of Onset   Diabetes Mother    Hypertension Mother  Hyperlipidemia Mother    Thyroid  disease Mother    Hypertension Father     Diabetes Father    Pancreatic cancer Paternal Aunt    Pancreatic cancer Paternal Uncle    Colon cancer Neg Hx    Esophageal cancer Neg Hx    Stomach cancer Neg Hx     Social History   Socioeconomic History   Marital status: Divorced    Spouse name: Not on file   Number of children: 0   Years of education: Not on file   Highest education level: Not on file  Occupational History   Occupation: Dance movement psychotherapist   Occupation: N/A  Tobacco Use   Smoking status: Never   Smokeless tobacco: Never  Vaping Use   Vaping status: Never Used  Substance and Sexual Activity   Alcohol  use: Never   Drug use: Never   Sexual activity: Yes  Other Topics Concern   Not on file  Social History Narrative   Not on file   Social Drivers of Health   Financial Resource Strain: Low Risk  (05/31/2023)   Received from [facility]   Overall Financial Resource Strain (CARDIA)    Difficulty of Paying Living Expenses: Not hard at all  Food Insecurity: No Food Insecurity (08/31/2023)   Hunger Vital Sign    Worried About Running Out of Food in the Last Year: Never true    Ran Out of Food in the Last Year: Never true  Transportation Needs: No Transportation Needs (08/30/2023)   PRAPARE - Administrator, Civil Service (Medical): No    Lack of Transportation (Non-Medical): No  Physical Activity: Unknown (05/31/2023)   Received from [facility]   Exercise Vital Sign    Days of Exercise per Week: Patient declined    Minutes of Exercise per Session: 0 min  Stress: No Stress Concern Present (05/31/2023)   Received from [facility] - Occupational Stress Questionnaire    Feeling of Stress : Not at all  Social Connections: Socially Integrated (05/31/2023)   Received from [facility]   Social Network    How would you rate your social network (family, work, friends)?: Good participation with social networks  Intimate Partner Violence: Not At Risk (08/31/2023)    Humiliation, Afraid, Rape, and Kick questionnaire    Fear of Current or Ex-Partner: No    Emotionally Abused: No    Physically Abused: No    Sexually Abused: No    Review of Systems: ROS is O/W negative except as mentioned in HPI.  Physical Exam: Vital signs in last 24 hours: Temp:  [98.1 F (36.7 C)-98.9 F (37.2 C)] 98.7 F (37.1 C) (06/05 0800) Pulse Rate:  [76-104] 76 (06/05 0800) Resp:  [14-32] 26 (06/05 0800) BP: (101-233)/(65-107) 161/86 (06/05 0800) SpO2:  [94 %-100 %] 97 % (06/05 0800) Weight:  [64.6 kg] 64.6 kg (06/04 1729) Last BM Date : 08/30/23 General:  Alert but sleepy, kept dozing off during out conversation.  Otherwise pleasant and cooperative in NAD Head:  Normocephalic and atraumatic. Eyes:  Sclera clear, no icterus.  Conjunctiva pink. Ears:  Normal auditory acuity. Mouth:  No deformity or lesions.   Lungs:  Clear throughout to auscultation.  No wheezes, crackles, or rhonchi.  Heart:  Regular rate and rhythm; no murmurs, clicks, rubs, or gallops. Abdomen:  Soft, non-distended.  BS present.  Minimal TTP in the epigastrium.   Msk:  Symmetrical without gross deformities. Pulses:  Normal  pulses noted. Extremities:  Without clubbing or edema. Neurologic:  Alert and oriented x 4;  grossly normal neurologically. Skin:  Intact without significant lesions or rashes. Psych:  Alert and cooperative. Normal mood and affect.  Intake/Output from previous day: 06/04 0701 - 06/05 0700 In: 1592.9 [I.V.:1492.9; IV Piggyback:100] Out: -   Lab Results: Recent Labs    08/29/23 2048 08/29/23 2304 08/30/23 1936 08/31/23 0329  WBC 10.4  --  7.4 7.7  HGB 12.2 12.6 11.0* 11.1*  HCT 36.4 37.0 34.4* 34.5*  PLT 200  --  171 174   BMET Recent Labs    08/30/23 0341 08/30/23 1936 08/31/23 0329  NA 134* 136 137  K 4.5 4.3 3.8  CL 95* 103 103  CO2 24 23 22   GLUCOSE 325* 147* 126*  BUN 63* 57* 50*  CREATININE 2.24* 2.11* 2.08*  CALCIUM  9.9 9.5 9.5   LFT Recent  Labs    08/31/23 0329  PROT 7.3  ALBUMIN  4.1  AST 112*  ALT 94*  ALKPHOS 99  BILITOT 0.6   IMPRESSION:  Epigastric pain with nausea and vomiting:  Likely related to her gastroparesis, had abnormal GES in May 2022 at [place].  Had been on pantoprazole  40 mg daily at home.    IPMN: CT scan and MRCP in April did not show any chronic pancreatitis changes.  Plan for follow-up imaging in a year.  Chronic kidney disease stage 3-4  Elevated LFTs: Have been elevated for about the past 2 months.  Has hepatic steatosis/MAFLD, but also had extensive lab workup as an outpatient through our office.  DM:  Had blood sugar of 679 on 6/3.  Familial hypertriglyceridemia:  Triglycerides had been over 1K, down to 555 yesterday.  CHF:  EF 45-50%  Prolonged QT interval  Leg abscess:  On abx.  This was the primary reason that she came to the hospital on this occasion.  PLAN: -Will start pantoprazole  40 mg IV daily. -EGD 6/6 to rule out esophagitis, ulcer, etc. -Needs better blood sugar control. -Don't know that reglan  is an option with her prolonged QT. -Stool studies were ordered for complaints of diarrhea, but she has not had any bowel movement since she has been admitted.  I am going to discontinue those orders for now.   Martina Sledge. Zehr  08/31/2023, 11:46 AM  GI ATTENDING  History, laboratories, x-rays, endoscopy reports previous reviewed.  Patient seen and examined.  Agree with comprehensive consultation note as outlined above.  We are asked to see this patient with multiple significant problems abdominal pain.  Admitted to the hospital with leg abscess.  Dominant complaints are chronic.  Despite reports.  No objective evidence for pancreatitis despite extensive in-depth evaluations.  No objective GI cause for her pain identified.  Agree with endoscopy to further evaluate for possible GI cause of pain.  Patient is higher than baseline risk due to her comorbidities. The nature of the procedure, as  well as the risks, benefits, and alternatives were carefully and thoroughly reviewed with the patient. Ample time for discussion and questions allowed. The patient understood, was satisfied, and agreed to proceed. In the meantime, primary service to continue to address her multiple other acute and chronic problems.  Murel Arlington. Willey Harrier., M.D. [facility] Division of Gastroenterology

## 2023-09-01 ENCOUNTER — Inpatient Hospital Stay (HOSPITAL_COMMUNITY): Admitting: Anesthesiology

## 2023-09-01 ENCOUNTER — Encounter (HOSPITAL_COMMUNITY)
Admission: EM | Disposition: A | Discharge status: Home / Self Care | Payer: Self-pay | Source: Home / Self Care | Attending: Internal Medicine

## 2023-09-01 ENCOUNTER — Encounter (HOSPITAL_COMMUNITY): Payer: Self-pay | Admitting: Internal Medicine

## 2023-09-01 DIAGNOSIS — N1832 Chronic kidney disease, stage 3b: Secondary | ICD-10-CM | POA: Diagnosis not present

## 2023-09-01 DIAGNOSIS — I5043 Acute on chronic combined systolic (congestive) and diastolic (congestive) heart failure: Secondary | ICD-10-CM

## 2023-09-01 DIAGNOSIS — I130 Hypertensive heart and chronic kidney disease with heart failure and stage 1 through stage 4 chronic kidney disease, or unspecified chronic kidney disease: Secondary | ICD-10-CM

## 2023-09-01 DIAGNOSIS — R1084 Generalized abdominal pain: Secondary | ICD-10-CM

## 2023-09-01 DIAGNOSIS — I951 Orthostatic hypotension: Secondary | ICD-10-CM | POA: Diagnosis not present

## 2023-09-01 DIAGNOSIS — I10 Essential (primary) hypertension: Secondary | ICD-10-CM | POA: Diagnosis not present

## 2023-09-01 DIAGNOSIS — E1165 Type 2 diabetes mellitus with hyperglycemia: Secondary | ICD-10-CM | POA: Diagnosis not present

## 2023-09-01 HISTORY — PX: ESOPHAGOGASTRODUODENOSCOPY: SHX5428

## 2023-09-01 LAB — CBC WITH DIFFERENTIAL/PLATELET
Abs Immature Granulocytes: 0.02 10*3/uL (ref 0.00–0.07)
Basophils Absolute: 0.1 10*3/uL (ref 0.0–0.1)
Basophils Relative: 1 %
Eosinophils Absolute: 0.5 10*3/uL (ref 0.0–0.5)
Eosinophils Relative: 8 %
HCT: 32.6 % — ABNORMAL LOW (ref 36.0–46.0)
Hemoglobin: 10.3 g/dL — ABNORMAL LOW (ref 12.0–15.0)
Immature Granulocytes: 0 %
Lymphocytes Relative: 38 %
Lymphs Abs: 2.2 10*3/uL (ref 0.7–4.0)
MCH: 29.5 pg (ref 26.0–34.0)
MCHC: 31.6 g/dL (ref 30.0–36.0)
MCV: 93.4 fL (ref 80.0–100.0)
Monocytes Absolute: 0.5 10*3/uL (ref 0.1–1.0)
Monocytes Relative: 8 %
Neutro Abs: 2.6 10*3/uL (ref 1.7–7.7)
Neutrophils Relative %: 45 %
Platelets: 164 10*3/uL (ref 150–400)
RBC: 3.49 MIL/uL — ABNORMAL LOW (ref 3.87–5.11)
RDW: 13.9 % (ref 11.5–15.5)
WBC: 5.8 10*3/uL (ref 4.0–10.5)
nRBC: 0 % (ref 0.0–0.2)

## 2023-09-01 LAB — COMPREHENSIVE METABOLIC PANEL WITH GFR
ALT: 87 U/L — ABNORMAL HIGH (ref 0–44)
AST: 76 U/L — ABNORMAL HIGH (ref 15–41)
Albumin: 3.8 g/dL (ref 3.5–5.0)
Alkaline Phosphatase: 97 U/L (ref 38–126)
Anion gap: 9 (ref 5–15)
BUN: 35 mg/dL — ABNORMAL HIGH (ref 6–20)
CO2: 21 mmol/L — ABNORMAL LOW (ref 22–32)
Calcium: 8.8 mg/dL — ABNORMAL LOW (ref 8.9–10.3)
Chloride: 107 mmol/L (ref 98–111)
Creatinine, Ser: 1.74 mg/dL — ABNORMAL HIGH (ref 0.44–1.00)
GFR, Estimated: 39 mL/min — ABNORMAL LOW (ref >60)
Glucose, Bld: 187 mg/dL — ABNORMAL HIGH (ref 70–99)
Potassium: 4.7 mmol/L (ref 3.5–5.1)
Sodium: 137 mmol/L (ref 135–145)
Total Bilirubin: 0.6 mg/dL (ref 0.0–1.2)
Total Protein: 6.8 g/dL (ref 6.5–8.1)

## 2023-09-01 LAB — GLUCOSE, CAPILLARY
Glucose-Capillary: 170 mg/dL — ABNORMAL HIGH (ref 70–99)
Glucose-Capillary: 113 mg/dL — ABNORMAL HIGH (ref 70–99)
Glucose-Capillary: 114 mg/dL — ABNORMAL HIGH (ref 70–99)
Glucose-Capillary: 118 mg/dL — ABNORMAL HIGH (ref 70–99)
Glucose-Capillary: 102 mg/dL — ABNORMAL HIGH (ref 70–99)
Glucose-Capillary: 185 mg/dL — ABNORMAL HIGH (ref 70–99)

## 2023-09-01 LAB — LIPASE, BLOOD: Lipase: 24 U/L (ref 11–51)

## 2023-09-01 SURGERY — EGD (ESOPHAGOGASTRODUODENOSCOPY)
Anesthesia: Monitor Anesthesia Care

## 2023-09-01 MED ORDER — LIDOCAINE 2% (20 MG/ML) 5 ML SYRINGE
INTRAMUSCULAR | Status: DC | PRN
  Administered 2023-09-01: 100 mg via INTRAVENOUS

## 2023-09-01 MED ORDER — SODIUM CHLORIDE 0.9 % IV SOLN: INTRAVENOUS | Status: DC | PRN

## 2023-09-01 MED ORDER — PROPOFOL 500 MG/50ML IV EMUL
INTRAVENOUS | Status: DC | PRN
  Administered 2023-09-01: 120 mg via INTRAVENOUS

## 2023-09-01 MED ORDER — DEXMEDETOMIDINE HCL IN NACL 80 MCG/20ML IV SOLN
INTRAVENOUS | Status: DC | PRN
Start: 2023-09-01 — End: 2023-09-01
  Administered 2023-09-01: 12 ug via INTRAVENOUS

## 2023-09-01 MED ORDER — ONDANSETRON HCL 4 MG/2ML IJ SOLN
INTRAMUSCULAR | Status: DC | PRN
Start: 2023-09-01 — End: 2023-09-01
  Administered 2023-09-01: 4 mg via INTRAVENOUS

## 2023-09-01 NOTE — Plan of Care (Signed)

## 2023-09-01 NOTE — Transfer of Care (Signed)
 Immediate Anesthesia Transfer of Care Note  Patient: Jennifer [name]  Procedure(s) Performed: EGD (ESOPHAGOGASTRODUODENOSCOPY)  Patient Location: PACU  Anesthesia Type:MAC  Level of Consciousness: sedated  Airway & Oxygen Therapy: Patient Spontanous Breathing and Patient connected to nasal cannula oxygen  Post-op Assessment: Report given to RN and Post -op Vital signs reviewed and stable  Post vital signs: Reviewed and stable  Last Vitals:  Vitals Value Taken Time  BP 112/69 09/01/23 1436  Temp    Pulse 85 09/01/23 1437  Resp 10 09/01/23 1437  SpO2 99 % 09/01/23 1437  Vitals shown include unfiled device data.  Last Pain:  Vitals:   09/01/23 1436  TempSrc:   PainSc: Asleep         Complications: No notable events documented.

## 2023-09-01 NOTE — Anesthesia Postprocedure Evaluation (Signed)
 Anesthesia Post Note  Patient: Jennifer [name]  Procedure(s) Performed: EGD (ESOPHAGOGASTRODUODENOSCOPY)     Patient location during evaluation: PACU Anesthesia Type: MAC Level of consciousness: awake and alert and oriented Pain management: pain level controlled Vital Signs Assessment: post-procedure vital signs reviewed and stable Respiratory status: spontaneous breathing, nonlabored ventilation and respiratory function stable Cardiovascular status: stable and blood pressure returned to baseline Postop Assessment: no apparent nausea or vomiting Anesthetic complications: no   No notable events documented.  Last Vitals:  Vitals:   09/01/23 1436 09/01/23 1440  BP: 112/69 108/78  Pulse: 83 81  Resp: 10 10  Temp:    SpO2: 100% 100%    Last Pain:  Vitals:   09/01/23 1440  TempSrc:   PainSc: 0-No pain                 Smaran Gaus A.

## 2023-09-01 NOTE — Progress Notes (Signed)
 PROGRESS NOTE    Jennifer [name]  WUJ:[id number] DOB: 11-04-1990 DOA: 08/29/2023 PCP: Calton Catholic, PA-C    Chief Complaint  Patient presents with   Abdominal Pain   Abscess    Brief Narrative:  Patient 33 year old female history of astrocytoma, chronic systolic CHF, QT prolongation, lipoprotein deficiency, familial hypertriglyceridemia, NASH, CKD stage IV, type 2 diabetes, hypertension, hypothyroidism, chronic pancreatitis, gastroparesis, peripheral neuropathy, GERD presented with complaints of severe generalized abdominal pain as well as abscess on her right upper thigh.  Patient seen in the ED, I&D of the abscess done in the ED.  Patient with ongoing epigastric abdominal pain, noted to have some nausea.  Patient admitted.   Assessment & Plan:   Principal Problem:   Hyperglycemia in setting of T2DM Active Problems:   Abscess   Intractable abdominal pain   Type 2 diabetes mellitus with hyperglycemia, with long-term current use of insulin  (HCC)   Diarrhea   Chronic pancreatitis (HCC)   Orthostatic hypotension   Prolonged QT interval   Elevated liver enzymes   CKD stage 3b, GFR 30-44 ml/min (HCC)   Chronic systolic congestive heart failure (HCC)   Familial hypertriglyceridemia   Essential hypertension   Hypothyroidism   Sleep apnea   Controlled type 2 diabetes mellitus with hyperglycemia (HCC)   Nausea without vomiting   Epigastric pain   #1  Hyperglycemia in the setting of type 2 diabetes -Patient presented to the ED with abdominal pain, groin abscess, uncontrolled sugars found to be hyperglycemic with glucose levels in the 600s in the setting of type 2 diabetes. - Patient did not meet criteria for DKA. - Hemoglobin A1c 6.1 04/2023. - Etiology of hyperglycemia could be due to infection concern for abscess versus UTI and recent change in her insulin  per patient. - Patient noted to have insulin  recently changed during last hospitalization and told admitting physician  feels it was not working as well as her prior insulin . - Patient placed on Endo tool/glucose stabilizer, CBGs improved. -CBG 170 this morning. -Patient transitioned to Semglee  40 units twice daily and currently on SSI. - CBGs every 4 hours. - Continue empiric IV antibiotics for UTI and possible groin abscess. - Tolerated clear liquids, currently n.p.o. for upper endoscopy.  -Post endoscopy pending findings will advance diet as tolerated to carb modified diet.   - Supportive care.  2.  Epigastric/upper abdominal pain -Patient with complaints of epigastric abdominal pain worse than her baseline chronic abdominal pain feels possibly may be due to acute on chronic pancreatitis. - Lipase levels within normal limits. - Patient tolerated clear liquids. -Continue Reglan  5 mg IV every 6 hours. - Gentle hydration for the next 24 hours. - Due to history of CHF and CKD will need to be judicious with IV fluids. - Continue home regimen Creon . - Continue IV PPI daily. -Patient seen in consultation by GI and patient for EGD today for further evaluation and management. -GI following and appreciate input and recommendations.  3.  Right groin abscess -Status post I&D in the ED with some drainage. - Warm compresses. - CBG management. - Continue empiric IV Rocephin .  4.  UTI -Urine cultures with multiple species present. - Continue IV Rocephin  and transition to oral antibiotics in the next 24 hours to complete an empiric 5-day course of treatment..  5.  Chronic pancreatitis -Secondary to familial hypertriglyceridemia. - Lipase levels normalized. - Continue home regimen fenofibrate , Crestor , Creon . - Oxycodone  as needed for pain. - IV Dilaudid  as needed for severe pain. -  GI consulted due to ongoing abdominal/epigastric pain.  6.  Diarrhea -Prior to mission patient noted to have 3-4 episodes of nonbloody diarrhea per hour which was felt to be new for the patient. - Patient afebrile, no  leukocytosis. - Patient states diarrhea has resolved has no further diarrhea since admission. - Stool studies ordered and discontinued by GI.    7.  Orthostatic hypotension -Patient noted to be orthostatic on admission. - Continue to hold diuretics. - TED hose.  8.  Hypertension - Continue home regimen Norvasc , Coreg , hydralazine .   - Continue to hold Demadex .  9.  Hypothyroidism -Synthroid  dose recently adjusted. - Continue home regimen Synthroid . - Outpatient follow-up with primary endocrinologist.  10.  History of prolonged QT interval -At baseline. - Keep potassium approximately 4, magnesium  approximately 2.  11.  Transaminitis -Follow. - Outpatient follow-up.  12.  CKD stage IIIb -Baseline creatinine approximately 1.7-2.3. - Currently close to baseline with creatinine of 1.74.   -Stable.  13.  Chronic systolic CHF -No signs of volume overloaded noted on examination. - Judicious IV fluid use. - Due to presentation with orthostasis we will continue to hold diuretics for now. - Continue home regimen Coreg , Norvasc , hydralazine .  14.  Sleep apnea -CPAP nightly.   DVT prophylaxis: Heparin  Code Status: Full Family Communication: Updated patient.  No family at bedside. Disposition: Transfer to telemetry.  Home when clinically improved.  Status is: Inpatient The patient will require care spanning > 2 midnights and should be moved to inpatient because: Severity of illness   Consultants:  GI: Dr. Elvin Hammer 08/31/2023  Procedures:  None  Antimicrobials:  Anti-infectives (From admission, onward)    Start     Dose/Rate Route Frequency Ordered Stop   08/31/23 0200  cefTRIAXone  (ROCEPHIN ) 2 g in sodium chloride  0.9 % 100 mL IVPB        2 g 200 mL/hr over 30 Minutes Intravenous Every 24 hours 08/30/23 1907     08/30/23 0200  cefTRIAXone  (ROCEPHIN ) 2 g in sodium chloride  0.9 % 100 mL IVPB        2 g 200 mL/hr over 30 Minutes Intravenous  Once 08/30/23 0153 08/30/23 0246          Subjective: Patient laying in bed.  Denies any chest pain or shortness of breath.  Still with abdominal pain however some improvement over the past 24 hours.  No further nausea or vomiting.  No diarrhea.  Tolerated clear liquids.  Awaiting upper endoscopy this morning.    Objective: Vitals:   09/01/23 0600 09/01/23 0700 09/01/23 0800 09/01/23 0900  BP: (!) 148/90 (!) 148/84 (!) 157/90 (!) 157/118  Pulse: 87 92 83 86  Resp: 14 (!) 24 14 14   Temp:   98.1 F (36.7 C)   TempSrc:   Oral   SpO2: 99% 96% 100% 93%  Weight:      Height:        Intake/Output Summary (Last 24 hours) at 09/01/2023 1054 Last data filed at 09/01/2023 0954 Gross per 24 hour  Intake 2429.51 ml  Output 1300 ml  Net 1129.51 ml   Filed Weights   08/29/23 2035 08/30/23 1729  Weight: 63.5 kg 64.6 kg    Examination:  General exam: NAD Respiratory system: Lungs clear to auscultation bilaterally.  No wheezes, no crackles, no rhonchi.  Fair air movement.  Speaking in full sentences.  Cardiovascular system: Regular rate rhythm no murmurs rubs or gallops.  No JVD.  No lower extremity edema.  Gastrointestinal system:  Abdomen is soft, nondistended, significantly decreased tenderness to palpation in the epigastric region.  Positive bowel sounds.  No rebound.  No guarding. Central nervous system: Alert and oriented. No focal neurological deficits. Extremities: Right upper thigh with indurated area, slight discharge when palpated and some tenderness to palpation.  Skin: No rashes, lesions or ulcers Psychiatry: Judgement and insight appear normal. Mood & affect appropriate.     Data Reviewed: I have personally reviewed following labs and imaging studies  CBC: Recent Labs  Lab 08/29/23 2048 08/29/23 2304 08/30/23 1936 08/31/23 0329 09/01/23 0330  WBC 10.4  --  7.4 7.7 5.8  NEUTROABS  --   --  3.7  --  2.6  HGB 12.2 12.6 11.0* 11.1* 10.3*  HCT 36.4 37.0 34.4* 34.5* 32.6*  MCV 88.8  --  93.0 91.5 93.4   PLT 200  --  171 174 164    Basic Metabolic Panel: Recent Labs  Lab 08/26/23 0439 08/26/23 0440 08/29/23 2048 08/29/23 2304 08/30/23 0341 08/30/23 1936 08/31/23 0329 09/01/23 0330  NA 133* 132* 128* 127* 134* 136 137 137  K 4.0 4.0 4.9 5.3* 4.5 4.3 3.8 4.7  CL 90* 89* 89*  --  95* 103 103 107  CO2 30 29 21*  --  24 23 22  21*  GLUCOSE 269* 261* 679*  --  325* 147* 126* 187*  BUN 64* 66* 64*  --  63* 57* 50* 35*  CREATININE 2.53* 2.51* 2.39*  --  2.24* 2.11* 2.08* 1.74*  CALCIUM  9.7 9.6 10.1  --  9.9 9.5 9.5 8.8*  MG 2.4  --   --   --   --  2.1  --   --   PHOS  --  4.8*  --   --   --   --   --   --     GFR: Estimated Creatinine Clearance: 35.9 mL/min (A) (by C-G formula based on SCr of 1.74 mg/dL (H)).  Liver Function Tests: Recent Labs  Lab 08/26/23 0440 08/29/23 2048 08/31/23 0329 09/01/23 0330  AST  --  78* 112* 76*  ALT  --  105* 94* 87*  ALKPHOS  --  150* 99 97  BILITOT  --  0.5 0.6 0.6  PROT  --  8.6* 7.3 6.8  ALBUMIN  4.0 5.0 4.1 3.8    CBG: Recent Labs  Lab 08/31/23 1605 08/31/23 1945 08/31/23 2332 09/01/23 0326 09/01/23 0818  GLUCAP 191* 178* 146* 170* 113*     Recent Results (from the past 240 hours)  Urine Culture (for pregnant, neutropenic or urologic patients or patients with an indwelling urinary catheter)     Status: Abnormal   Collection Time: 08/30/23  9:01 PM   Specimen: Urine, Clean Catch  Result Value Ref Range Status   Specimen Description   Final    URINE, CLEAN CATCH Performed at [facility], [address] [place], [place] [address]    Special Requests   Final    NONE Performed at [facility], [address], [place], [place] [address]    Culture MULTIPLE SPECIES PRESENT, SUGGEST RECOLLECTION (A)  Final   Report Status 08/31/2023 FINAL  Final         Radiology Studies: No results found.      Scheduled Meds:  amitriptyline   10 mg Oral QHS   amLODipine   5 mg Oral Daily    aspirin  EC  81 mg Oral Q1500   calcitRIOL   0.25 mcg Oral Daily  carvedilol   9.375 mg Oral BID   Chlorhexidine  Gluconate Cloth  6 each Topical Daily   DULoxetine   60 mg Oral QHS   fenofibrate   54 mg Oral QHS   hydrALAZINE   75 mg Oral TID   insulin  aspart  0-15 Units Subcutaneous Q4H   insulin  glargine-yfgn  40 Units Subcutaneous BID   ivabradine   7.5 mg Oral BID WC   levothyroxine   150 mcg Oral QAC breakfast   lipase/protease/amylase  36,000 Units Oral TID   loratadine   10 mg Oral QHS   metoCLOPramide  (REGLAN ) injection  5 mg Intravenous Q6H   norethindrone -ethinyl estradiol -iron  1 tablet Oral Daily   pantoprazole  (PROTONIX ) IV  40 mg Intravenous Q24H   pregabalin   300 mg Oral QHS   rosuvastatin   10 mg Oral Daily   sodium bicarbonate   1,300 mg Oral BID   topiramate   50 mg Oral BID   [START ON 09/03/2023] Vitamin D  (Ergocalciferol )  50,000 Units Oral Q Sun   Continuous Infusions:  cefTRIAXone  (ROCEPHIN )  IV Stopped (09/01/23 0430)   insulin  Stopped (08/31/23 1225)     LOS: 1 day    Time spent: 40 minutes    Hilda Lovings, MD [facility]   To contact the attending provider between 7A-7P or the covering provider during after hours 7P-7A, please log into the web site [email or web address] and access using universal [place] password for that web site. If you do not have the password, please call the hospital operator.  09/01/2023, 10:54 AM

## 2023-09-01 NOTE — Interval H&P Note (Signed)
 History and Physical Interval Note:  09/01/2023 1:51 PM  Jennifer [name]  has presented today for surgery, with the diagnosis of Epigastric abdominal pain, nausea and vomiting.  The various methods of treatment have been discussed with the patient and family. After consideration of risks, benefits and other options for treatment, the patient has consented to  Procedure(s): EGD (ESOPHAGOGASTRODUODENOSCOPY) (N/A) as a surgical intervention.  The patient's history has been reviewed, patient examined, no change in status, stable for surgery.  I have reviewed the patient's chart and labs.  Questions were answered to the patient's satisfaction.     Legrand Puma

## 2023-09-01 NOTE — Anesthesia Preprocedure Evaluation (Addendum)
 Anesthesia Evaluation  Patient identified by MRN, date of birth, ID band Patient awake    Reviewed: Allergy & Precautions, NPO status , Patient's Chart, lab work & pertinent test results, reviewed documented beta blocker date and time   Airway Mallampati: II  TM Distance: >3 FB     Dental no notable dental hx. (+) Teeth Intact, Dental Advisory Given   Pulmonary sleep apnea and Continuous Positive Airway Pressure Ventilation    Pulmonary exam normal breath sounds clear to auscultation       Cardiovascular hypertension, Pt. on medications and Pt. on home beta blockers + CAD, + Past MI and +CHF   Rhythm:Regular Rate:Tachycardia     Neuro/Psych Peripheral neuropathy  Neuromuscular disease  negative psych ROS   GI/Hepatic ,GERD  Medicated,,(+) Hepatitis -, UnspecifiedNASH Diabetic gastroparesis Chronic pancreatitis Epigastric abdominal pain N/V   Endo/Other  diabetes, Poorly Controlled, Type 1, Insulin  DependentHypothyroidism  Hyperlipidemia Obesity  Renal/GU Renal InsufficiencyRenal disease  negative genitourinary   Musculoskeletal negative musculoskeletal ROS (+)    Abdominal  (+) + obese  Peds  Hematology  (+) Blood dyscrasia, anemia   Anesthesia Other Findings   Reproductive/Obstetrics                              Anesthesia Physical Anesthesia Plan  ASA: 3  Anesthesia Plan: MAC   Post-op Pain Management: Minimal or no pain anticipated   Induction: Intravenous  PONV Risk Score and Plan: 2 and Treatment may vary due to age or medical condition and Propofol infusion  Airway Management Planned: Natural Airway and Nasal Cannula  Additional Equipment:   Intra-op Plan:   Post-operative Plan:   Informed Consent: I have reviewed the patients History and Physical, chart, labs and discussed the procedure including the risks, benefits and alternatives for the proposed anesthesia  with the patient or authorized representative who has indicated his/her understanding and acceptance.     Dental advisory given  Plan Discussed with: Anesthesiologist and CRNA  Anesthesia Plan Comments:          Anesthesia Quick Evaluation

## 2023-09-01 NOTE — Op Note (Addendum)
 [facility] Patient Name: Jennifer [name] Procedure Date: 09/01/2023 MRN: [id number] Attending MD: Murel Arlington. Elvin Hammer , MD, [id number] Date of Birth: 09-05-1990 CSN: [id number] Age: 33 Admit Type: Inpatient Procedure:                Upper GI endoscopy Indications:              Generalized abdominal pain?chronic Providers:                Murel Arlington. Elvin Hammer, MD, Marden Shaggy, RN, Rinda Cheers,                            Technician Referring MD:             [facility] Medicines:                Monitored Anesthesia Care Complications:            No immediate complications. Estimated Blood Loss:     Estimated blood loss: none. Procedure:                Pre-Anesthesia Assessment:                           - Prior to the procedure, a History and Physical                            was performed, and patient medications and                            allergies were reviewed. The patient's tolerance of                            previous anesthesia was also reviewed. The risks                            and benefits of the procedure and the sedation                            options and risks were discussed with the patient.                            All questions were answered, and informed consent                            was obtained. Prior Anticoagulants: The patient has                            taken no anticoagulant or antiplatelet agents. ASA                            Grade Assessment: III - A patient with severe                            systemic disease. After reviewing the risks and  benefits, the patient was deemed in satisfactory                            condition to undergo the procedure.                           After obtaining informed consent, the endoscope was                            passed under direct vision. Throughout the                            procedure, the patient's blood pressure, pulse, and                             oxygen saturations were monitored continuously. The                            [id number]-[id number] ([id number] Olympus endoscope was introduced                            through the mouth, and advanced to the second part                            of duodenum. The upper GI endoscopy was                            accomplished without difficulty. The patient                            tolerated the procedure well. Scope In: Scope Out: Findings:      The esophagus was normal.      The stomach was normal.      The examined duodenum was normal.      The cardia and gastric fundus were normal on retroflexion. Impression:               1. Normal EGD                           2. No primary GI cause for pain identified or                            suspected                           3. Multiple medical problems. Moderate Sedation:      none Recommendation:           1. Resume diet                           2. Return to the care of primary service                           3. GI follow-up outpatient as needed with Dr. Rosaline Coma  GI will sign off. Findings and recommendations                            discussed with the patient. She was provided a copy                            of this report. Procedure Code(s):        --- Professional ---                           [phone], Esophagogastroduodenoscopy, flexible,                            transoral; diagnostic, including collection of                            specimen(s) by brushing or washing, when performed                            (separate procedure) Diagnosis Code(s):        --- Professional ---                           R10.84, Generalized abdominal pain CPT copyright 2022 [employer]. All rights reserved. The codes documented in this report are preliminary and upon coder review may  be revised to meet current compliance requirements. Murel Arlington. Elvin Hammer, MD 09/01/2023 2:33:43 PM This report has been signed  electronically. Number of Addenda: 0

## 2023-09-02 DIAGNOSIS — I951 Orthostatic hypotension: Secondary | ICD-10-CM | POA: Diagnosis not present

## 2023-09-02 DIAGNOSIS — I10 Essential (primary) hypertension: Secondary | ICD-10-CM | POA: Diagnosis not present

## 2023-09-02 DIAGNOSIS — N1832 Chronic kidney disease, stage 3b: Secondary | ICD-10-CM | POA: Diagnosis not present

## 2023-09-02 DIAGNOSIS — E1165 Type 2 diabetes mellitus with hyperglycemia: Secondary | ICD-10-CM | POA: Diagnosis not present

## 2023-09-02 LAB — COMPREHENSIVE METABOLIC PANEL WITH GFR
ALT: 72 U/L — ABNORMAL HIGH (ref 0–44)
AST: 54 U/L — ABNORMAL HIGH (ref 15–41)
Albumin: 3.6 g/dL (ref 3.5–5.0)
Alkaline Phosphatase: 103 U/L (ref 38–126)
Anion gap: 7 (ref 5–15)
BUN: 40 mg/dL — ABNORMAL HIGH (ref 6–20)
CO2: 24 mmol/L (ref 22–32)
Calcium: 8.7 mg/dL — ABNORMAL LOW (ref 8.9–10.3)
Chloride: 107 mmol/L (ref 98–111)
Creatinine, Ser: 2.14 mg/dL — ABNORMAL HIGH (ref 0.44–1.00)
GFR, Estimated: 31 mL/min — ABNORMAL LOW (ref >60)
Glucose, Bld: 168 mg/dL — ABNORMAL HIGH (ref 70–99)
Potassium: 3.9 mmol/L (ref 3.5–5.1)
Sodium: 138 mmol/L (ref 135–145)
Total Bilirubin: 0.4 mg/dL (ref 0.0–1.2)
Total Protein: 6.7 g/dL (ref 6.5–8.1)

## 2023-09-02 LAB — CBC
HCT: 29.5 % — ABNORMAL LOW (ref 36.0–46.0)
Hemoglobin: 9.1 g/dL — ABNORMAL LOW (ref 12.0–15.0)
MCH: 29.5 pg (ref 26.0–34.0)
MCHC: 30.8 g/dL (ref 30.0–36.0)
MCV: 95.8 fL (ref 80.0–100.0)
Platelets: 142 10*3/uL — ABNORMAL LOW (ref 150–400)
RBC: 3.08 MIL/uL — ABNORMAL LOW (ref 3.87–5.11)
RDW: 14 % (ref 11.5–15.5)
WBC: 5.3 10*3/uL (ref 4.0–10.5)
nRBC: 0 % (ref 0.0–0.2)

## 2023-09-02 LAB — GLUCOSE, CAPILLARY
Glucose-Capillary: 104 mg/dL — ABNORMAL HIGH (ref 70–99)
Glucose-Capillary: 122 mg/dL — ABNORMAL HIGH (ref 70–99)
Glucose-Capillary: 160 mg/dL — ABNORMAL HIGH (ref 70–99)
Glucose-Capillary: 165 mg/dL — ABNORMAL HIGH (ref 70–99)
Glucose-Capillary: 225 mg/dL — ABNORMAL HIGH (ref 70–99)
Glucose-Capillary: 281 mg/dL — ABNORMAL HIGH (ref 70–99)

## 2023-09-02 MED ORDER — CEFADROXIL 500 MG PO CAPS
1000.0000 mg | ORAL_CAPSULE | Freq: Two times a day (BID) | ORAL | Status: DC
  Administered 2023-09-03: 1000 mg via ORAL
  Filled 2023-09-02: qty 2

## 2023-09-02 MED ORDER — LINEZOLID 600 MG PO TABS
600.0000 mg | ORAL_TABLET | Freq: Two times a day (BID) | ORAL | Status: DC
  Administered 2023-09-02 – 2023-09-03 (×3): 600 mg via ORAL
  Filled 2023-09-02 (×3): qty 1

## 2023-09-02 MED ORDER — TORSEMIDE 20 MG PO TABS
60.0000 mg | ORAL_TABLET | Freq: Every day | ORAL | Status: DC
  Administered 2023-09-02: 60 mg via ORAL
  Filled 2023-09-02 (×2): qty 3

## 2023-09-02 NOTE — Progress Notes (Signed)
 PROGRESS NOTE    Jennifer [name]  AVW:[id number] DOB: Jul 17, 1990 DOA: 08/29/2023 PCP: Calton Catholic, PA-C    Chief Complaint  Patient presents with   Abdominal Pain   Abscess    Brief Narrative:  Patient 33 year old female history of astrocytoma, chronic systolic CHF, QT prolongation, lipoprotein deficiency, familial hypertriglyceridemia, NASH, CKD stage IV, type 2 diabetes, hypertension, hypothyroidism, chronic pancreatitis, gastroparesis, peripheral neuropathy, GERD presented with complaints of severe generalized abdominal pain as well as abscess on her right upper thigh.  Patient seen in the ED, I&D of the abscess done in the ED.  Patient with ongoing epigastric abdominal pain, noted to have some nausea.  Patient admitted.   Assessment & Plan:   Principal Problem:   Hyperglycemia in setting of T2DM Active Problems:   Abscess   Intractable abdominal pain   Type 2 diabetes mellitus with hyperglycemia, with long-term current use of insulin  (HCC)   Diarrhea   Chronic pancreatitis (HCC)   Orthostatic hypotension   Prolonged QT interval   Elevated liver enzymes   CKD stage 3b, GFR 30-44 ml/min (HCC)   Chronic systolic congestive heart failure (HCC)   Familial hypertriglyceridemia   Essential hypertension   Hypothyroidism   Sleep apnea   Controlled type 2 diabetes mellitus with hyperglycemia (HCC)   Nausea without vomiting   Epigastric pain   #1  Hyperglycemia in the setting of type 2 diabetes -Patient presented to the ED with abdominal pain, groin abscess, uncontrolled sugars found to be hyperglycemic with glucose levels in the 600s in the setting of type 2 diabetes. - Patient did not meet criteria for DKA. - Hemoglobin A1c 6.1 04/2023. - Etiology of hyperglycemia could be due to infection concern for abscess versus UTI and recent change in her insulin  per patient. - Patient noted to have insulin  recently changed during last hospitalization and told admitting physician  feels it was not working as well as her prior insulin . - Patient placed on Endo tool/glucose stabilizer, CBGs improved. -CBG 104 this morning. -Patient transitioned to Semglee  40 units twice daily and currently on SSI. - Change CBGs to [facility] and at bedtime.  - Continue empiric antibiotics for UTI and possible groin abscess. - Tolerating carb modified diet.   - Supportive care.   2.  Epigastric/upper abdominal pain -Patient with complaints of epigastric abdominal pain worse than her baseline chronic abdominal pain feels possibly may be due to acute on chronic pancreatitis. - Lipase levels within normal limits. -Tolerating carb modified diet. -Continue Reglan  5 mg IV every 6 hours. - Saline lock IV fluids. - Due to history of CHF and CKD will need to be judicious with IV fluids. - Continue home regimen Creon . - Change IV PPI to oral PPI daily.   -Patient seen in consultation by GI and patient underwent upper endoscopy which was unremarkable. -GI following and appreciate input and recommendations.  3.  Right groin abscess -Status post I&D in the ED with some drainage. - Warm compresses. - CBG management. - Place on Zyvox.  4.  UTI -Urine cultures with multiple species present. -Will transition from IV Rocephin  to Duricef to complete a 5-day course of empiric antibiotic treatment.  5.  Chronic pancreatitis -Secondary to familial hypertriglyceridemia. - Lipase levels normalized. - Continue home regimen fenofibrate , Crestor , Creon . - Oxycodone  as needed for pain. - IV Dilaudid  as needed for severe pain. - GI consulted due to ongoing abdominal/epigastric pain.  6.  Diarrhea -Prior to mission patient noted to have 3-4 episodes of  nonbloody diarrhea per hour which was felt to be new for the patient. - Patient afebrile, no leukocytosis. - Patient states diarrhea has resolved has no further diarrhea since admission. - Stool studies discontinued by GI.    7.  Orthostatic  hypotension -Patient noted to be orthostatic on admission. - Diuretics held, will resume diuretics today.   - TED hose ordered but not placed.   8.  Hypertension - Continue home regimen Norvasc , Coreg , hydralazine .   - Resume home regimen Demadex .   9.  Hypothyroidism -Synthroid  dose recently adjusted. - Continue home regimen Synthroid . - Outpatient follow-up with primary endocrinologist.  10.  History of prolonged QT interval -At baseline. - Keep potassium approximately 4, magnesium  approximately 2.  11.  Transaminitis -Follow. - Outpatient follow-up.  12.  CKD stage IIIb -Baseline creatinine approximately 1.7-2.3. - Currently close to baseline with creatinine of 2.14.   -Stable.  13.  Chronic systolic CHF -No signs of volume overloaded noted on examination. - Judicious IV fluid use. - Due to presentation with orthostasis patient's diuretics were held.  -Continue home regimen Norvasc , Coreg , hydralazine .   - Resume home regimen Demadex .    14.  Sleep apnea -CPAP nightly.   DVT prophylaxis: Heparin  Code Status: Full Family Communication: Updated patient.  No family at bedside. Disposition: Home when clinically improved hopefully in the next 24 hours.    Status is: Inpatient The patient will require care spanning > 2 midnights and should be moved to inpatient because: Severity of illness   Consultants:  GI: Dr. Elvin Hammer 08/31/2023  Procedures:  Upper endoscopy 09/01/2023 per Dr. Elvin Hammer  Antimicrobials:  Anti-infectives (From admission, onward)    Start     Dose/Rate Route Frequency Ordered Stop   08/31/23 0200  cefTRIAXone  (ROCEPHIN ) 2 g in sodium chloride  0.9 % 100 mL IVPB        2 g 200 mL/hr over 30 Minutes Intravenous Every 24 hours 08/30/23 1907     08/30/23 0200  cefTRIAXone  (ROCEPHIN ) 2 g in sodium chloride  0.9 % 100 mL IVPB        2 g 200 mL/hr over 30 Minutes Intravenous  Once 08/30/23 0153 08/30/23 0246         Subjective: Patient lying in bed.   Overall feels much better.  Denies any chest pain or shortness of breath.  Feels abdominal pain has improved.  No further nausea or vomiting.  No diarrhea.  Tolerating current carb modified diet.    Objective: Vitals:   09/02/23 0000 09/02/23 0100 09/02/23 0224 09/02/23 0443  BP:   117/66 121/67  Pulse: 81  84 85  Resp: 12 17 18 18   Temp:   98.8 F (37.1 C) 98.1 F (36.7 C)  TempSrc:   Oral Oral  SpO2: 92%  94% 95%  Weight:      Height:        Intake/Output Summary (Last 24 hours) at 09/02/2023 1126 Last data filed at 09/01/2023 1429 Gross per 24 hour  Intake 150 ml  Output 300 ml  Net -150 ml   Filed Weights   08/29/23 2035 08/30/23 1729  Weight: 63.5 kg 64.6 kg    Examination:  General exam: NAD. Respiratory system: CTAB.  No wheezes, no crackles, no rhonchi.  Fair air movement.  Speaking in full sentences.  Cardiovascular system: RRR no murmurs rubs or gallops.  No JVD.  No pitting lower extremity edema.  Gastrointestinal system: Abdomen soft, nondistended, significantly decreased tenderness to palpation epigastric region, positive bowel  sounds.  No rebound.  No guarding. Central nervous system: Alert and oriented. No focal neurological deficits. Extremities: Right upper thigh with indurated area, slight discharge when palpated and some tenderness to palpation.  Skin: No rashes, lesions or ulcers Psychiatry: Judgement and insight appear normal. Mood & affect appropriate.     Data Reviewed: I have personally reviewed following labs and imaging studies  CBC: Recent Labs  Lab 08/29/23 2048 08/29/23 2304 08/30/23 1936 08/31/23 0329 09/01/23 0330 09/02/23 0708  WBC 10.4  --  7.4 7.7 5.8 5.3  NEUTROABS  --   --  3.7  --  2.6  --   HGB 12.2 12.6 11.0* 11.1* 10.3* 9.1*  HCT 36.4 37.0 34.4* 34.5* 32.6* 29.5*  MCV 88.8  --  93.0 91.5 93.4 95.8  PLT 200  --  171 174 164 142*    Basic Metabolic Panel: Recent Labs  Lab 08/30/23 0341 08/30/23 1936 08/31/23 0329  09/01/23 0330 09/02/23 0708  NA 134* 136 137 137 138  K 4.5 4.3 3.8 4.7 3.9  CL 95* 103 103 107 107  CO2 24 23 22  21* 24  GLUCOSE 325* 147* 126* 187* 168*  BUN 63* 57* 50* 35* 40*  CREATININE 2.24* 2.11* 2.08* 1.74* 2.14*  CALCIUM  9.9 9.5 9.5 8.8* 8.7*  MG  --  2.1  --   --   --     GFR: Estimated Creatinine Clearance: 29.2 mL/min (A) (by C-G formula based on SCr of 2.14 mg/dL (H)).  Liver Function Tests: Recent Labs  Lab 08/29/23 2048 08/31/23 0329 09/01/23 0330 09/02/23 0708  AST 78* 112* 76* 54*  ALT 105* 94* 87* 72*  ALKPHOS 150* 99 97 103  BILITOT 0.5 0.6 0.6 0.4  PROT 8.6* 7.3 6.8 6.7  ALBUMIN  5.0 4.1 3.8 3.6    CBG: Recent Labs  Lab 09/01/23 1537 09/01/23 1958 09/02/23 0023 09/02/23 0402 09/02/23 0810  GLUCAP 102* 185* 122* 225* 104*     Recent Results (from the past 240 hours)  Urine Culture (for pregnant, neutropenic or urologic patients or patients with an indwelling urinary catheter)     Status: Abnormal   Collection Time: 08/30/23  9:01 PM   Specimen: Urine, Clean Catch  Result Value Ref Range Status   Specimen Description   Final    URINE, CLEAN CATCH Performed at [facility], [address] [place], [place] [address]    Special Requests   Final    NONE Performed at [facility], [address], [place], [place] [address]    Culture MULTIPLE SPECIES PRESENT, SUGGEST RECOLLECTION (A)  Final   Report Status 08/31/2023 FINAL  Final         Radiology Studies: No results found.      Scheduled Meds:  amitriptyline   10 mg Oral QHS   amLODipine   5 mg Oral Daily   aspirin  EC  81 mg Oral Q1500   calcitRIOL   0.25 mcg Oral Daily   carvedilol   9.375 mg Oral BID   Chlorhexidine  Gluconate Cloth  6 each Topical Daily   DULoxetine   60 mg Oral QHS   fenofibrate   54 mg Oral QHS   hydrALAZINE   75 mg Oral TID   insulin  aspart  0-15 Units Subcutaneous Q4H   insulin  glargine-yfgn  40 Units Subcutaneous  BID   ivabradine   7.5 mg Oral BID WC   levothyroxine   150 mcg Oral QAC breakfast   lipase/protease/amylase  36,000 Units Oral TID   loratadine   10 mg Oral QHS   metoCLOPramide  (REGLAN ) injection  5 mg Intravenous Q6H   norethindrone -ethinyl estradiol -iron  1 tablet Oral Daily   pantoprazole  (PROTONIX ) IV  40 mg Intravenous Q24H   pregabalin   300 mg Oral QHS   rosuvastatin   10 mg Oral Daily   sodium bicarbonate   1,300 mg Oral BID   topiramate   50 mg Oral BID   [START ON 09/03/2023] Vitamin D  (Ergocalciferol )  50,000 Units Oral Q Sun   Continuous Infusions:  cefTRIAXone  (ROCEPHIN )  IV 2 g (09/02/23 0551)     LOS: 2 days    Time spent: 40 minutes    Hilda Lovings, MD [facility]   To contact the attending provider between 7A-7P or the covering provider during after hours 7P-7A, please log into the web site [email or web address] and access using universal [place] password for that web site. If you do not have the password, please call the hospital operator.  09/02/2023, 11:26 AM

## 2023-09-02 NOTE — Plan of Care (Signed)

## 2023-09-03 DIAGNOSIS — I951 Orthostatic hypotension: Secondary | ICD-10-CM | POA: Diagnosis not present

## 2023-09-03 DIAGNOSIS — E1165 Type 2 diabetes mellitus with hyperglycemia: Secondary | ICD-10-CM | POA: Diagnosis not present

## 2023-09-03 DIAGNOSIS — R109 Unspecified abdominal pain: Secondary | ICD-10-CM

## 2023-09-03 DIAGNOSIS — N1832 Chronic kidney disease, stage 3b: Secondary | ICD-10-CM | POA: Diagnosis not present

## 2023-09-03 DIAGNOSIS — I10 Essential (primary) hypertension: Secondary | ICD-10-CM | POA: Diagnosis not present

## 2023-09-03 LAB — CBC
HCT: 31 % — ABNORMAL LOW (ref 36.0–46.0)
Hemoglobin: 10 g/dL — ABNORMAL LOW (ref 12.0–15.0)
MCH: 29.9 pg (ref 26.0–34.0)
MCHC: 32.3 g/dL (ref 30.0–36.0)
MCV: 92.5 fL (ref 80.0–100.0)
Platelets: 146 10*3/uL — ABNORMAL LOW (ref 150–400)
RBC: 3.35 MIL/uL — ABNORMAL LOW (ref 3.87–5.11)
RDW: 13.7 % (ref 11.5–15.5)
WBC: 6 10*3/uL (ref 4.0–10.5)
nRBC: 0 % (ref 0.0–0.2)

## 2023-09-03 LAB — GLUCOSE, CAPILLARY
Glucose-Capillary: 260 mg/dL — ABNORMAL HIGH (ref 70–99)
Glucose-Capillary: 175 mg/dL — ABNORMAL HIGH (ref 70–99)
Glucose-Capillary: 97 mg/dL (ref 70–99)
Glucose-Capillary: 127 mg/dL — ABNORMAL HIGH (ref 70–99)

## 2023-09-03 LAB — BASIC METABOLIC PANEL WITH GFR
Anion gap: 12 (ref 5–15)
BUN: 51 mg/dL — ABNORMAL HIGH (ref 6–20)
CO2: 23 mmol/L (ref 22–32)
Calcium: 9.1 mg/dL (ref 8.9–10.3)
Chloride: 108 mmol/L (ref 98–111)
Creatinine, Ser: 2.3 mg/dL — ABNORMAL HIGH (ref 0.44–1.00)
GFR, Estimated: 28 mL/min — ABNORMAL LOW (ref >60)
Glucose, Bld: 112 mg/dL — ABNORMAL HIGH (ref 70–99)
Potassium: 3.8 mmol/L (ref 3.5–5.1)
Sodium: 143 mmol/L (ref 135–145)

## 2023-09-03 MED ORDER — AMLODIPINE BESYLATE 5 MG PO TABS: 5.0000 mg | ORAL_TABLET | Freq: Every day | ORAL | Status: DC

## 2023-09-03 MED ORDER — TORSEMIDE 20 MG PO TABS: 40.0000 mg | ORAL_TABLET | Freq: Every day | ORAL | Status: DC

## 2023-09-03 MED ORDER — HYDRALAZINE HCL 50 MG PO TABS: 75.0000 mg | ORAL_TABLET | Freq: Three times a day (TID) | ORAL | Status: DC

## 2023-09-03 MED ORDER — PANTOPRAZOLE SODIUM 40 MG PO TBEC: 40.0000 mg | DELAYED_RELEASE_TABLET | Freq: Every day | ORAL | Status: DC

## 2023-09-03 MED ORDER — LINEZOLID 600 MG PO TABS: 600.0000 mg | ORAL_TABLET | Freq: Two times a day (BID) | ORAL | 0 refills | Status: AC

## 2023-09-03 MED ORDER — ROSUVASTATIN CALCIUM 10 MG PO TABS: 10.0000 mg | ORAL_TABLET | Freq: Every day | ORAL | 0 refills | Status: DC

## 2023-09-03 MED ORDER — FENOFIBRATE 145 MG PO TABS: 145.0000 mg | ORAL_TABLET | Freq: Every day | ORAL | 11 refills | Status: DC

## 2023-09-03 MED ORDER — HYDRALAZINE HCL 50 MG PO TABS: 50.0000 mg | ORAL_TABLET | Freq: Three times a day (TID) | ORAL | Status: DC

## 2023-09-03 NOTE — Discharge Summary (Signed)
 Physician Discharge Summary  Jennifer [name] ZOX:[id number] DOB: 1990/08/06 DOA: 08/29/2023  PCP: Calton Catholic, PA-C  Admit date: 08/29/2023 Discharge date: 09/03/2023  Time spent: 60 minutes  Recommendations for Outpatient Follow-up:  Follow-up with Calton Catholic, PA-C in 1 to 2 weeks.  On follow-up patient will need a comprehensive metabolic profile done to follow-up on electrolytes, renal function, LFTs.  Patient's blood pressure need to be reassessed on follow-up. Follow-up with Herbert Loh, PA, cardiology as scheduled on 09/04/2023.  Patient's cardiac medications will need to be reassessed as patient's Norvasc  was discontinued on discharge, hydralazine  dose decreased to 50mg  3 times daily and Demadex  decreased to 40 mg daily due to soft blood pressure.   Discharge Diagnoses:  Principal Problem:   Hyperglycemia in setting of T2DM Active Problems:   Abscess   Intractable abdominal pain   Type 2 diabetes mellitus with hyperglycemia, with long-term current use of insulin  (HCC)   Diarrhea   Chronic pancreatitis (HCC)   Orthostatic hypotension   Prolonged QT interval   Elevated liver enzymes   CKD stage 3b, GFR 30-44 ml/min (HCC)   Chronic systolic congestive heart failure (HCC)   Familial hypertriglyceridemia   Essential hypertension   Hypothyroidism   Sleep apnea   Controlled type 2 diabetes mellitus with hyperglycemia (HCC)   Nausea without vomiting   Epigastric pain   Discharge Condition: Stable and improved.  Diet recommendation: Heart healthy/carb modified  Filed Weights   08/29/23 2035 08/30/23 1729  Weight: 63.5 kg 64.6 kg    History of present illness:  HPI per Dr. Suzzane Estes is a 32 y.o. female with medical history significant of hx of astrocytoma, chronic systolic CHF, QT prolongation, lipoprotein deficiency, familial TG, NASH, CKD stage IV, T2DM, HTN, hypothyroidism, chronic pancreatitis, gastroparesis, peripheral neuropathy, GERD who presented  to ED with complaints of severe generalized abdominal pain and abscess on her leg. She states she came to ED to have her abscess looked out and also because the pain in her stomach has never resolved. She states her stomach was hurting when she was discharged, but has gotten progressively worse since being home. Pain is epigastric in nature. Pain is constant in nature. Doesn't radiate. Described as achy in nature. Eating makes it worse. She has only eaten once since being home. She states she has been taking her insulin  and has not missed any doses. Her insulin  was changed when she was discharged just a few days ago and she doesn't think it's working well.    She noticed the abscess on her leg weeks ago and has had since she was in the hospital. She was trying to wait to go to the derm, but it was getting bigger and rubbing against her clothes so she came to ED. She has had chills, no fevers. She has had nausea, but no vomiting. She also has had diarrhea that started two to three days ago. [name] having 3-4 episodes/hour. Denies any blood in her stool.    Admitted for prolonged hospitalization form 5/20-08/28/23 for severe abdominal pain and placed on insulin  infusion due to concerns for hypertriglyceridemia induced pancreatitis. As a result of IV volume resuscitation she developed acute cardiogenic volume overload. Cardiology was consulted. Hospitalization additionally complicated by AKI superimposed on CKD stage 4 with worsening hyponatremia prompting nephrology consult.      Denies any vision changes/headaches, chest pain or palpitations, shortness of breath or cough,vomiting, dysuria or leg swelling.      She does not smoke  or drink alcohol .    ER Course:  vitals: afebrile, bup: 168/89, HR: 93, RR: 22, oxygen: 98% on RA  Pertinent labs: sodium: 128, glucose: 679, BUN: 64, creatinine: 2.39, AST: 78, ALT: 105, alk phos: 150, AG: 18,  In ED: started on DKA protocol/insulin  gtt. Given pain meds, zofran   and also had abscess drained on leg. [facility] asked to admit.    Hospital Course:  #1  Hyperglycemia in the setting of type 2 diabetes -Patient presented to the ED with abdominal pain, groin abscess, uncontrolled sugars found to be hyperglycemic with glucose levels in the 600s in the setting of type 2 diabetes. - Patient did not meet criteria for DKA. - Hemoglobin A1c 6.1 04/2023. - Etiology of hyperglycemia could be due to infection concern for abscess versus UTI and recent change in her insulin  per patient. - Patient noted to have insulin  recently changed during last hospitalization and told admitting physician feels it was not working as well as her prior insulin . - Patient placed on Endo tool/glucose stabilizer, CBGs improved. -Patient transitioned to Semglee  40 units twice daily and maintained on SSI with good blood glucose control during the hospitalization.  -Patient also treated with antibiotics due to concern for UTI and groin abscess.   - Transition to a carb modified diet which she tolerated.   - Outpatient follow-up with PCP.    2.  Epigastric/upper abdominal pain -Patient with complaints of epigastric abdominal pain worse than her baseline chronic abdominal pain feels possibly may be due to acute on chronic pancreatitis. - Lipase levels within normal limits. -Tolerating carb modified diet. -Continue Reglan  5 mg IV every 6 hours. - Saline lock IV fluids. - Due to history of CHF and CKD IV fluids were nutritiously given.  -Patient placed back on home regimen Creon  once patient was placed on a diet. -Patient maintained on PPI. -Patient seen in consultation by GI and patient underwent upper endoscopy which was unremarkable. -Outpatient follow-up with GI as needed.   3.  Right groin abscess -Status post I&D in the ED with some drainage. - Warm compresses. -Diabetes managed. -Patient started on Zyvox and be discharged on 6 more days of Zyvox to complete a 7-day course of antibiotic  treatment.   4.  UTI -Urine cultures with multiple species present. - Patient was on IV Rocephin  during the hospitalization and transition to Duricef and completed a course of antibiotic treatment.   5.  Chronic pancreatitis -Secondary to familial hypertriglyceridemia. - Lipase levels normalized. - Patient placed back on home regimen fenofibrate , Crestor , Creon . - Oxycodone  as needed for pain. - IV Dilaudid  as needed for severe pain. - GI consulted due to ongoing abdominal/epigastric pain.   6.  Diarrhea -Prior to mission patient noted to have 3-4 episodes of nonbloody diarrhea per hour which was felt to be new for the patient. - Patient afebrile, no leukocytosis. - Patient states diarrhea has resolved has no further diarrhea since admission. - Stool studies discontinued by GI.     7.  Orthostatic hypotension -Patient noted to be orthostatic on admission. - Diuretics initially held, and when resumed at home dose of 60 mg daily patient had soft blood pressure and as such dose has been decreased to 40 mg daily on discharge.  - Home regimen Norvasc  discontinued and hydralazine  decreased to 50 mg 3 times daily.   8.  Hypertension - Patient initially during the hospitalization was maintained on home regimen of Norvasc , Coreg , hydralazine .   - Home regimen Demadex  initially  held.   - Demadex  was resumed at home dose of 60 mg daily however due to low blood pressure Demadex  dose has been decreased to 40 mg daily, Norvasc  discontinued and hydralazine  dose decreased to 50 mg 3 times daily.   -Outpatient follow-up with cardiology.    9.  Hypothyroidism -Synthroid  dose recently adjusted. - Patient maintained on home regimen Synthroid . - Outpatient follow-up with primary endocrinologist.   10.  History of prolonged QT interval -At baseline. - Keep potassium approximately 4, magnesium  approximately 2.   11.  Transaminitis -Follow. - Outpatient follow-up.   12.  CKD stage IIIb -Baseline  creatinine approximately 1.7-2.3. - Remained stable at baseline.    13.  Chronic systolic CHF -No signs of volume overloaded noted on examination. - Judicious IV fluid use. - Due to presentation with orthostasis patient's diuretics were held initially during the hospitalization. - Patient maintained on Norvasc , Coreg , hydralazine  early on during the hospitalization. - Demadex  resumed however due to low blood pressure dose decreased to 40 mg daily, Norvasc  discontinued and hydralazine  dose decreased to 50 mg 3 times daily. - Outpatient follow-up with cardiology as previously scheduled on 09/04/2023.   14.  Sleep apnea -CPAP nightly.  Procedures: Upper endoscopy 09/01/2023 per Dr. Elvin Hammer   Consultations: GI: Dr. Elvin Hammer 08/31/2023   Discharge Exam: Vitals:   09/03/23 0525 09/03/23 1403  BP: (!) 116/44 107/61  Pulse: 70 63  Resp: 18 19  Temp: 97.9 F (36.6 C) (!) 97.5 F (36.4 C)  SpO2: 96% 100%    General: NAD Cardiovascular: Regular rate rhythm no murmurs rubs or gallops.  No JVD.  No lower extremity edema. Respiratory: Clear to auscultation bilaterally.  No wheezes, no crackles, no rhonchi.  Fair air movement.  Speaking in full sentences.  Discharge Instructions   Discharge Instructions     Diet - low sodium heart healthy   Complete by: As directed    Diet Carb Modified   Complete by: As directed    Increase activity slowly   Complete by: As directed       Allergies as of 09/03/2023       Reactions   Icosapent  Ethyl (epa Ethyl Ester) (fish) Hives   Ketamine Other (See Comments)   "In a vegetative state for 15 minutes," per pt   Maitake Itching, Other (See Comments)   Itchy throat   Morphine  Hives, Itching, Rash, Other (See Comments)   Sore throat, too   Penicillins Hives, Itching, Rash   Has patient had a PCN reaction causing immediate rash, facial/tongue/throat swelling, SOB or lightheadedness with hypotension: Y Has patient had a PCN reaction causing severe rash  involving mucus membranes or skin necrosis: Y Has patient had a PCN reaction that required hospitalization: N Has patient had a PCN reaction occurring within the last 10 years: Y   Mushroom Other (See Comments)   Pt has never had mushrooms but tested positive on allergy test   Shellfish Allergy Other (See Comments)   Pt has never had shellfish, but tested positive on allergy test. Pt states contrast in CT is okay.   Fentanyl  Nausea And Vomiting   Prednisone Rash        Medication List     STOP taking these medications    amLODipine  5 MG tablet Commonly known as: NORVASC    sucralfate  1 g tablet Commonly known as: Carafate        TAKE these medications    amitriptyline  10 MG tablet Commonly known as: ELAVIL  Take 10  mg by mouth at bedtime.   aspirin  EC 81 MG tablet Take 1 tablet (81 mg total) by mouth daily. Swallow whole. What changed: when to take this   azelastine  0.1 % nasal spray Commonly known as: ASTELIN  Place 2 sprays into both nostrils daily as needed for rhinitis or allergies.   Baqsimi One Pack 3 MG/DOSE Powd Generic drug: Glucagon Place 1 Dose into the nose once as needed (hypoglycemia).   Blisovi  Fe 1.5/30 1.5-30 MG-MCG tablet Generic drug: norethindrone -ethinyl estradiol -iron Take 1 tablet by mouth daily.   BLOOD GLUCOSE TEST STRIPS Strp 1 each by Does not apply route 3 (three) times daily. Use as directed to check blood sugar. May dispense any manufacturer covered by patient's insurance and fits patient's device.   butalbital -acetaminophen -caffeine  50-325-40 MG tablet Commonly known as: FIORICET  Take 1 tablet by mouth daily as needed for headache.   calcitRIOL  0.25 MCG capsule Commonly known as: ROCALTROL  Take 0.25 mcg by mouth daily.   carvedilol  6.25 MG tablet Commonly known as: COREG  Take 1.5 tablets (9.375 mg total) by mouth 2 (two) times daily.   CRANBERRY CONCENTRATE PO Take 1 tablet by mouth daily at 12 noon.   Creon  36000-114000  units Cpep capsule Generic drug: lipase/protease/amylase Take 1 capsule (36,000 Units total) by mouth 3 (three) times daily.   Dexcom G7 Sensor Misc Inject 1 Device into the skin See admin instructions. Place 1 new sensor into the skin every 10 days   DULoxetine  60 MG capsule Commonly known as: CYMBALTA  Take 60 mg by mouth at bedtime.   ergocalciferol  1.25 MG (50000 UT) capsule Commonly known as: VITAMIN D2 Take 50,000 Units by mouth every Sunday.   fenofibrate  54 MG tablet Take 1 tablet (54 mg total) by mouth daily. What changed: when to take this   fenofibrate  145 MG tablet Commonly known as: Tricor  Take 1 tablet (145 mg total) by mouth daily. What changed: You were already taking a medication with the same name, and this prescription was added. Make sure you understand how and when to take each.   HumaLOG  KwikPen 100 UNIT/ML KwikPen Generic drug: insulin  lispro Inject 60-95 Units into the skin See admin instructions. Inject 60-95 units into the skin three times a day before meals, per sliding scale   HumuLIN  R U-500 KwikPen 500 UNIT/ML KwikPen Generic drug: insulin  regular human CONCENTRATED Inject 195-300 Units into the skin See admin instructions. 195 am bef breakfast300 hs   hydrALAZINE  50 MG tablet Commonly known as: APRESOLINE  Take 1 tablet (50 mg total) by mouth 3 (three) times daily. What changed: how much to take   insulin  glargine-yfgn 100 UNIT/ML Pen Commonly known as: SEMGLEE  Inject 44 Units into the skin 2 (two) times daily. May substitute as needed per insurance.   ivabradine  7.5 MG Tabs tablet Commonly known as: CORLANOR  Take 1 tablet (7.5 mg total) by mouth 2 (two) times daily with a meal. What changed: when to take this   Lancet Device Misc 1 each by Does not apply route 3 (three) times daily. May dispense any manufacturer covered by patient's insurance.   Lancets Misc 1 each by Does not apply route 3 (three) times daily. Use as directed to check  blood sugar. May dispense any manufacturer covered by patient's insurance and fits patient's device.   levocetirizine 5 MG tablet Commonly known as: XYZAL  Take 5 mg by mouth at bedtime.   levothyroxine  150 MCG tablet Commonly known as: SYNTHROID  Take 150 mcg by mouth daily before breakfast.  linezolid 600 MG tablet Commonly known as: ZYVOX Take 1 tablet (600 mg total) by mouth every 12 (twelve) hours for 6 days.   metoCLOPramide  5 MG tablet Commonly known as: Reglan  Take 1 tablet (5 mg total) by mouth every 8 (eight) hours as needed for nausea or vomiting.   multivitamin with minerals Tabs tablet Take 1 tablet by mouth daily.   oxyCODONE  5 MG immediate release tablet Commonly known as: Oxy IR/ROXICODONE  Take 1 tablet (5 mg total) by mouth every 6 (six) hours as needed for severe pain (pain score 7-10).   pantoprazole  40 MG tablet Commonly known as: PROTONIX  Take 40 mg by mouth daily before breakfast.   Pen Needles 31G X 5 MM Misc Use as directed 3 (three) times daily.   polyethylene glycol 17 g packet Commonly known as: MIRALAX  / GLYCOLAX  Take 17 g by mouth daily as needed for mild constipation. Also available OTC   pregabalin  300 MG capsule Commonly known as: LYRICA  Take 300 mg by mouth at bedtime.   prochlorperazine  10 MG tablet Commonly known as: COMPAZINE  Take 10 mg by mouth at bedtime. Preventative   promethazine  25 MG tablet Commonly known as: PHENERGAN  Take 1 tablet (25 mg total) by mouth every 6 (six) hours as needed for nausea or vomiting.   Repatha  SureClick 140 MG/ML Soaj Generic drug: Evolocumab  Inject 140 mg into the skin every 14 (fourteen) days.   rosuvastatin  10 MG tablet Commonly known as: CRESTOR  Take 1 tablet (10 mg total) by mouth daily. Start taking on: September 04, 2023   Safety Seal Miscellaneous Misc Hormonic Hair Solution with minoxidil USP 7% and finasteride USP 0.05% - apply to affected areas daily every morning.   sodium bicarbonate   650 MG tablet Take 2 tablets (1,300 mg total) by mouth 2 (two) times daily.   topiramate  50 MG tablet Commonly known as: TOPAMAX  Take 50 mg by mouth 2 (two) times daily.   torsemide  20 MG tablet Commonly known as: DEMADEX  Take 2 tablets (40 mg total) by mouth daily. What changed: how much to take   TYLENOL  500 MG tablet Generic drug: acetaminophen  Take 500-1,000 mg by mouth every 6 (six) hours as needed for mild pain (pain score 1-3) (or headaches).       Allergies  Allergen Reactions   Icosapent  Ethyl (Epa Ethyl Ester) (Fish) Hives   Ketamine Other (See Comments)    "In a vegetative state for 15 minutes," per pt   Maitake Itching and Other (See Comments)    Itchy throat   Morphine  Hives, Itching, Rash and Other (See Comments)    Sore throat, too   Penicillins Hives, Itching and Rash    Has patient had a PCN reaction causing immediate rash, facial/tongue/throat swelling, SOB or lightheadedness with hypotension: Y Has patient had a PCN reaction causing severe rash involving mucus membranes or skin necrosis: Y Has patient had a PCN reaction that required hospitalization: N Has patient had a PCN reaction occurring within the last 10 years: Y   Mushroom Other (See Comments)    Pt has never had mushrooms but tested positive on allergy test   Shellfish Allergy Other (See Comments)    Pt has never had shellfish, but tested positive on allergy test. Pt states contrast in CT is okay.   Fentanyl  Nausea And Vomiting   Prednisone Rash    Follow-up Information     Calton Catholic, PA-C. Schedule an appointment as soon as possible for a visit in 1 week(s).  Specialty: Physician Assistant Why: f/u in 1-2 weeks. Contact information: [address] [place] [address] [place] [address] [phone]         Flo Hummingbird, PA-C Follow up on 09/04/2023.   Specialty: Cardiology Why: f/u as scheduled at 915am Contact information: [address] [place]  [address] [phone]                  The results of significant diagnostics from this hospitalization (including imaging, microbiology, ancillary and laboratory) are listed below for reference.    Significant Diagnostic Studies: DG CHEST PORT 1 VIEW Result Date: 08/20/2023 CLINICAL DATA:  141880 SOB (shortness of breath) [id number] EXAM: PORTABLE CHEST 1 VIEW COMPARISON:  July 02, 2023 FINDINGS: The cardiomediastinal silhouette is unchanged and enlarged in contour.CardioMEMS. No pleural effusion. No pneumothorax. No acute pleuroparenchymal abnormality. IMPRESSION: No acute cardiopulmonary abnormality. Electronically Signed   By: Clancy Crimes M.D.   On: 08/20/2023 11:55   US  RENAL Result Date: 08/18/2023 CLINICAL DATA:  Acute renal failure EXAM: RENAL / URINARY TRACT ULTRASOUND COMPLETE COMPARISON:  06/21/2023 FINDINGS: Right Kidney: Renal measurements: 8.7 x 5.4 x 5.0 cm = volume: 122.6 mL. Echogenicity within normal limits. No mass or hydronephrosis visualized. Left Kidney: Renal measurements: 9.9 x 4.5 x 4.3 cm = volume: 100.4 mL. Echogenicity within normal limits. No mass or hydronephrosis visualized. Bladder: Appears normal for degree of bladder distention. Other: None. IMPRESSION: 1. Unremarkable renal ultrasound. Electronically Signed   By: Bobbye Burrow M.D.   On: 08/18/2023 16:54   ECHOCARDIOGRAM COMPLETE Result Date: 08/16/2023    ECHOCARDIOGRAM REPORT   Patient Name:   KAMALJIT HIZER Date of Exam: 08/16/2023 Medical Rec #:  [id number]         Height:       57.0 in Accession #:    [id number]        Weight:       153.4 lb Date of Birth:  02/20/1991         BSA:          1.607 m Patient Age:    33 years          BP:           143/75 mmHg Patient Gender: F                 HR:           112 bpm. Exam Location:  Inpatient Procedure: 2D Echo, Cardiac Doppler, Color Doppler and Intracardiac            Opacification Agent (Both Spectral and Color Flow Doppler were            utilized  during procedure). Indications:    Congestive Heart Failure I50.9  History:        Patient has prior history of Echocardiogram examinations, most                 recent 06/10/2022. CHF, Arrythmias:Atrial Fibrillation; Risk                 Factors:Hypertension and Diabetes.  Sonographer:    Astrid Blamer Referring Phys: [id number] DAVID MANUEL ORTIZ IMPRESSIONS  1. Left ventricular ejection fraction, by estimation, is 45 to 50%. The left ventricle has mildly decreased function. The left ventricle demonstrates global hypokinesis. Left ventricular diastolic function could not be evaluated.  2. Right ventricular systolic function is normal. The right ventricular size is normal. There is normal pulmonary artery systolic pressure.  3.  The mitral valve is normal in structure. Trivial mitral valve regurgitation. No evidence of mitral stenosis.  4. The aortic valve is tricuspid. Aortic valve regurgitation is not visualized. No aortic stenosis is present.  5. The inferior vena cava is normal in size with <50% respiratory variability, suggesting right atrial pressure of 8 mmHg. FINDINGS  Left Ventricle: Left ventricular ejection fraction, by estimation, is 45 to 50%. The left ventricle has mildly decreased function. The left ventricle demonstrates global hypokinesis. The left ventricular internal cavity size was normal in size. There is  no left ventricular hypertrophy. Left ventricular diastolic function could not be evaluated. Right Ventricle: The right ventricular size is normal. No increase in right ventricular wall thickness. Right ventricular systolic function is normal. There is normal pulmonary artery systolic pressure. The tricuspid regurgitant velocity is 2.23 m/s, and  with an assumed right atrial pressure of 8 mmHg, the estimated right ventricular systolic pressure is 27.9 mmHg. Left Atrium: Left atrial size was normal in size. Right Atrium: Right atrial size was normal in size. Pericardium: There is no evidence of  pericardial effusion. Mitral Valve: The mitral valve is normal in structure. Trivial mitral valve regurgitation. No evidence of mitral valve stenosis. Tricuspid Valve: The tricuspid valve is normal in structure. Tricuspid valve regurgitation is trivial. No evidence of tricuspid stenosis. Aortic Valve: The aortic valve is tricuspid. Aortic valve regurgitation is not visualized. No aortic stenosis is present. Aortic valve mean gradient measures 5.0 mmHg. Aortic valve peak gradient measures 8.0 mmHg. Aortic valve area, by VTI measures 1.82 cm. Pulmonic Valve: The pulmonic valve was normal in structure. Pulmonic valve regurgitation is not visualized. No evidence of pulmonic stenosis. Aorta: The aortic root is normal in size and structure. Venous: The inferior vena cava is normal in size with less than 50% respiratory variability, suggesting right atrial pressure of 8 mmHg. IAS/Shunts: No atrial level shunt detected by color flow Doppler.  LEFT VENTRICLE PLAX 2D LVIDd:         4.50 cm   Diastology LVIDs:         3.50 cm   LV e' lateral:   7.94 cm/s LV PW:         1.00 cm   LV E/e' lateral: 13.2 LV IVS:        0.85 cm LVOT diam:     1.80 cm LV SV:         45 LV SV Index:   28 LVOT Area:     2.54 cm  RIGHT VENTRICLE RV S prime:     12.40 cm/s TAPSE (M-mode): 1.9 cm LEFT ATRIUM             Index        RIGHT ATRIUM           Index LA Vol (A2C):   23.7 ml 14.75 ml/m  RA Area:     11.50 cm LA Vol (A4C):   32.1 ml 19.97 ml/m  RA Volume:   26.90 ml  16.74 ml/m LA Biplane Vol: 27.7 ml 17.24 ml/m  AORTIC VALVE AV Area (Vmax):    1.89 cm AV Area (Vmean):   1.81 cm AV Area (VTI):     1.82 cm AV Vmax:           141.00 cm/s AV Vmean:          113.000 cm/s AV VTI:            0.245 m AV Peak Grad:  8.0 mmHg AV Mean Grad:      5.0 mmHg LVOT Vmax:         105.00 cm/s LVOT Vmean:        80.500 cm/s LVOT VTI:          0.175 m LVOT/AV VTI ratio: 0.71  AORTA Ao Root diam: 2.00 cm MITRAL VALVE                TRICUSPID VALVE MV  Area (PHT): 8.15 cm     TR Peak grad:   19.9 mmHg MV E velocity: 105.00 cm/s  TR Vmax:        223.00 cm/s MV A velocity: 44.60 cm/s MV E/A ratio:  2.35         SHUNTS                             Systemic VTI:  0.18 m                             Systemic Diam: 1.80 cm Maudine Sos MD Electronically signed by Maudine Sos MD Signature Date/Time: 08/16/2023/12:05:38 PM    Final    US  Abdomen Limited Result Date: 08/15/2023 CLINICAL DATA:  33 year old female with history of chronic pancreatitis, epigastric pain, and gastroparesis, with new onset ascites. IR requested for diagnostic and therapeutic paracentesis. EXAM: ULTRASOUND ABDOMEN LIMITED COMPARISON:  CT abdomen pelvis with contrast on 06/21/2023 FINDINGS: Limited ultrasound done of all 4 quadrants. There is only small volume of ascites present. There is no pocket of fluid large enough to allow for safe approach for paracentesis. IMPRESSION: Small volume ascites without pocket of fluid large enough to allow for safe approach for paracentesis. Ultrasound performed by: Lambert Pillion, PA-C Electronically Signed   By: Creasie Doctor M.D.   On: 08/15/2023 15:52    Microbiology: Recent Results (from the past 240 hours)  Urine Culture (for pregnant, neutropenic or urologic patients or patients with an indwelling urinary catheter)     Status: Abnormal   Collection Time: 08/30/23  9:01 PM   Specimen: Urine, Clean Catch  Result Value Ref Range Status   Specimen Description   Final    URINE, CLEAN CATCH Performed at [facility], [address], [place], [place] [address]    Special Requests   Final    NONE Performed at [facility], [address], [place], [place] [address]    Culture MULTIPLE SPECIES PRESENT, SUGGEST RECOLLECTION (A)  Final   Report Status 08/31/2023 FINAL  Final     Labs: Basic Metabolic Panel: Recent Labs  Lab 08/30/23 1936 08/31/23 0329 09/01/23 0330 09/02/23 0708 09/03/23 0610   NA 136 137 137 138 143  K 4.3 3.8 4.7 3.9 3.8  CL 103 103 107 107 108  CO2 23 22 21* 24 23  GLUCOSE 147* 126* 187* 168* 112*  BUN 57* 50* 35* 40* 51*  CREATININE 2.11* 2.08* 1.74* 2.14* 2.30*  CALCIUM  9.5 9.5 8.8* 8.7* 9.1  MG 2.1  --   --   --   --    Liver Function Tests: Recent Labs  Lab 08/29/23 2048 08/31/23 0329 09/01/23 0330 09/02/23 0708  AST 78* 112* 76* 54*  ALT 105* 94* 87* 72*  ALKPHOS 150* 99 97 103  BILITOT 0.5 0.6 0.6 0.4  PROT 8.6* 7.3 6.8 6.7  ALBUMIN  5.0 4.1 3.8 3.6  Recent Labs  Lab 08/29/23 2048 09/01/23 0330  LIPASE 43 24   No results for input(s): "AMMONIA" in the last 168 hours. CBC: Recent Labs  Lab 08/30/23 1936 08/31/23 0329 09/01/23 0330 09/02/23 0708 09/03/23 0610  WBC 7.4 7.7 5.8 5.3 6.0  NEUTROABS 3.7  --  2.6  --   --   HGB 11.0* 11.1* 10.3* 9.1* 10.0*  HCT 34.4* 34.5* 32.6* 29.5* 31.0*  MCV 93.0 91.5 93.4 95.8 92.5  PLT 171 174 164 142* 146*   Cardiac Enzymes: No results for input(s): "CKTOTAL", "CKMB", "CKMBINDEX", "TROPONINI" in the last 168 hours. BNP: BNP (last 3 results) Recent Labs    06/18/23 0244 07/10/23 1316 08/20/23 0551  BNP 112.3* 102.7* 278.7*    ProBNP (last 3 results) No results for input(s): "PROBNP" in the last 8760 hours.  CBG: Recent Labs  Lab 09/02/23 2046 09/03/23 0141 09/03/23 0359 09/03/23 0740 09/03/23 1122  GLUCAP 281* 260* 175* 97 127*       Signed:  Hilda Lovings MD.  [facility] 09/03/2023, 3:04 PM

## 2023-09-03 NOTE — Plan of Care (Signed)

## 2023-09-04 ENCOUNTER — Encounter: Payer: Self-pay | Admitting: Physician Assistant

## 2023-09-04 ENCOUNTER — Ambulatory Visit: Attending: Internal Medicine | Admitting: Physician Assistant

## 2023-09-04 VITALS — BP 143/74 | HR 98 | Ht <= 58 in | Wt 139.4 lb

## 2023-09-04 DIAGNOSIS — N184 Chronic kidney disease, stage 4 (severe): Secondary | ICD-10-CM

## 2023-09-04 DIAGNOSIS — Z794 Long term (current) use of insulin: Secondary | ICD-10-CM

## 2023-09-04 DIAGNOSIS — I428 Other cardiomyopathies: Secondary | ICD-10-CM | POA: Diagnosis not present

## 2023-09-04 DIAGNOSIS — I2510 Atherosclerotic heart disease of native coronary artery without angina pectoris: Secondary | ICD-10-CM

## 2023-09-04 DIAGNOSIS — I10 Essential (primary) hypertension: Secondary | ICD-10-CM

## 2023-09-04 DIAGNOSIS — E1165 Type 2 diabetes mellitus with hyperglycemia: Secondary | ICD-10-CM

## 2023-09-04 DIAGNOSIS — E781 Pure hyperglyceridemia: Secondary | ICD-10-CM

## 2023-09-04 DIAGNOSIS — I272 Pulmonary hypertension, unspecified: Secondary | ICD-10-CM

## 2023-09-04 NOTE — Patient Instructions (Signed)
 Medication Instructions:  NO CHANGES   Lab Work: NONE   Testing/Procedures: NONE  Follow-Up: At [facility], you and your health needs are our priority.  As part of our continuing mission to provide you with exceptional heart care, our providers are all part of one team.  This team includes your primary Cardiologist (physician) and Advanced Practice Providers or APPs (Physician Assistants and Nurse Practitioners) who all work together to provide you with the care you need, when you need it.  Your next appointment:   3-4 MONTHS  Provider:   Knox Perl, MD     Other Instructions PLEASE MAINTAIN A LOW SODIUM DIET AND BE SURE TO JUST YOUR BLOOD PRESSURE DAILY.

## 2023-09-04 NOTE — Progress Notes (Signed)
 Cardiology Office Note:  .   Date:  09/04/2023  ID:  Jennifer [name], DOB 1990-11-10, MRN [id number] PCP: Mendel Stain  [facility] Providers Cardiologist:  Knox Perl, MD    History of Present Illness: .   Solash Tullo is a 33 y.o. 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  Admitted 11/22/20 with ab pain and diarrhea. Ab CT concerning for vasculitis (no pancreatitis). Her TG were found to be 4030. She was started on an insulin /dextrose  infusion. Echo 11/28/20 EF 20-25% with moderate RV dysfunction and mild septal flattening. Started on DBA 2.5. cMRI c/w prior myocarditis vs sarcoid (see below). Mod pericardial effusion also noted. LVEF 29%. RV normal. She underwent R/LHC showing single vessel RCA occlusion, RA 1, LVEDP 17, Fick CO 5.6/CI 3.88. She was able to be weaned off inotropes and GDMT titrated.     Post hospital follow up, she was drinking >2L/day fluid and eating more salty foods, volume was up, ReDs 43%, She followed up with Dr. Candiss Chamorro with [facility] 12/17/20 for a 2nd opinion. He placed LifeVest and switched beta blocker to Toprol  XL.   Echo (10/22): EF up to 45-50%, grade II DD, RV ok. LifeVest off.  RHC 5/23 with elevated filling pressures and normal cardiac ouput. Mild to moderate mixed pulmonary hypertension with prominent v-waves in RA, suggestive of significant TR. Given IV lasix  and metolazone  2.5 added weekly to diuretic regimen.    Previously being followed by Dr. Mammie Sears at [facility]. Had renal biopsy recently (12/31/21) and showed DM and HTN nephropathy. They discussed starting HD soon. Now being followed by [facility].    Echo 10/04/22 EF 55% RV ok. Mild AI    Continues to have multiple hospitalizations for hyperglycemia, hyperTG (>5,000) and uncontrolled HTN. It is suspected  she has OSA. Observed apnea noted previous hospitalizations. She has been referred to Pulmonology and they have recommended sleep study-she has a CPAP but has only used it twice.  She was seen in AHF clinic 08/07/23, medical compliance questionable, BP poorly controlled, coreg  increased, awaiting cpap.  Patient discharged yest after adm with recurrent hyperglycemia, groin abscess s/p I&D, orthostatic hypotension, norvasc  stopped and hydralazine  decreased 50 mg tid. Patient feels fine. Not using CPAP regularly. Hospitalizations reviewed in detail.    ROS:    Studies Reviewed: Aaron Aas         Prior CV Studies:    Echo 08/16/23: 1. Left ventricular ejection fraction, by estimation, is 45 to 50%. The  left ventricle has mildly decreased function. The left ventricle  demonstrates global hypokinesis. Left ventricular diastolic function could  not be evaluated.   2. Right ventricular systolic function is normal. The right ventricular  size is normal. There is normal pulmonary artery systolic pressure.   3. The mitral valve is normal in structure. Trivial mitral valve  regurgitation. No evidence of mitral stenosis.   4. The aortic valve is tricuspid. Aortic valve regurgitation is not  visualized. No aortic stenosis is present.   5. The inferior vena cava is normal in size with <50% respiratory  variability, suggesting right atrial pressure of 8 mmHg.   Risk Assessment/Calculations:            Physical Exam:   VS:  BP (!) 143/74   Pulse 98   Ht 4\' 9"  (1.448 m)   Wt 139  lb 6.4 oz (63.2 kg)   LMP 09/01/2023   SpO2 99%   BMI 30.17 kg/m    Wt Readings from Last 3 Encounters:  09/04/23 139 lb 6.4 oz (63.2 kg)  08/30/23 142 lb 6.7 oz (64.6 kg)  08/25/23 146 lb 13.2 oz (66.6 kg)    GEN: Well nourished, well developed in no acute distress NECK: No JVD; No carotid bruits CARDIAC:  RRR, no murmurs, rubs, gallops RESPIRATORY:  Clear to auscultation without rales, wheezing or rhonchi  ABDOMEN:  Soft, non-tender, non-distended EXTREMITIES:  No edema; No deformity   ASSESSMENT AND PLAN: .    Nonischemic cardiomyopathy, recovered EF, previously with HFrEF (EF 29%) - Echo (5/23): EF 45-50%, RV normal  - Echo 9/23: EF 50-55%, RV normal. mild MR, mild-mod TR - Echo 3/24 EF 45-50% mild-mod TR, moderate MR - Echo (06/10/22) EF 30-35% (lower than past), RV mildly dilated, mod-sev MR, sev TR. - Echo 10/04/22 EF 55% RV ok. Mild AI  -Echo 07/2023 EF 40-45% - NYHA II, volume compensated on exam   - continue Coreg  to 9.375 mg bid - Continue torsemide  60 mg daily. - Continue hydralazine  50 mg tid - Continue ivabradine  7.5 mg bid.  -off amlodipine  due to hypotension in hospital-many need to be restarted - Off spiro due to hyperkalemia  - Off Jardiance due to frequent GU infections  - Off ARB/ARNI with CKD and AKIs - I asked her to use Cardiomems daily - Labs today. - Repeat echo next visit.   CAD - Single vessel RCA occlusion (small co-dominant vessel) on cardiac cath 09/08. - No chest pain - Continue medical management    CKD IV - Frequent AKIs - Scr baseline variable with multiple AKIs, typically 2.0-2.5 - Previously followed by Dr. Mammie Sears (Nephrology at [facility]).  - Had renal biopsy (12/31/21) and showed DM and HTN nephropathy.  - Now followed by [facility] - Labs today   Pulmonary hypertension - Mild to moderate on RHC 5/23. PVR 3.7 WU. - RHC 12/06/21: RA 15, PA 37/23 (28), PCWP 16, Fick CO/CI 4.2/2.6, Thermo CO/CI 3.7/2.3, PVR 4.3, PA sat 51%, 54%, PAPi 0.93  - VQ negative - She stopped sildenafil  with dizziness and low BP    HTN - BP  low in hospital and hydralazine  decreased 50 mg tid/amlodipine  stopped. Just discharged yest.  I asked her to keep track of BP's at home and let us  know. Suspect we'll have to go back up on meds.  - underlying OSA-I discussed importance of compliance with CPAP - GDMT limited by CKD    Severe hyperTG - Due to LPL deficiency w/ recent pancreatitis. -  Continue fenofibrate   - Recent admission >5000 - CTA/P negative for pancreatitis or other acute findings  - [name] followed in [facility] (Dr. Maximo Spar)    Type 3c DM - 2/2 chronic pancreatitis  - Follows w/ endocrinology at [facility]. - On insulin            Dispo: f/u with Dr. Berry Bristol  Signed, Theotis Flake, PA-C

## 2023-09-05 ENCOUNTER — Encounter (HOSPITAL_BASED_OUTPATIENT_CLINIC_OR_DEPARTMENT_OTHER): Payer: Self-pay

## 2023-09-05 ENCOUNTER — Other Ambulatory Visit: Payer: Self-pay

## 2023-09-05 ENCOUNTER — Emergency Department (HOSPITAL_BASED_OUTPATIENT_CLINIC_OR_DEPARTMENT_OTHER)

## 2023-09-05 ENCOUNTER — Emergency Department (HOSPITAL_BASED_OUTPATIENT_CLINIC_OR_DEPARTMENT_OTHER)
Admission: EM | Admit: 2023-09-05 | Discharge: 2023-09-05 | Disposition: A | Discharge status: Home / Self Care | Attending: Emergency Medicine | Admitting: Emergency Medicine

## 2023-09-05 ENCOUNTER — Emergency Department (HOSPITAL_BASED_OUTPATIENT_CLINIC_OR_DEPARTMENT_OTHER)
Admission: EM | Admit: 2023-09-05 | Discharge: 2023-09-06 | Disposition: A | Discharge status: Home / Self Care | Source: Home / Self Care | Attending: Emergency Medicine | Admitting: Emergency Medicine

## 2023-09-05 DIAGNOSIS — R109 Unspecified abdominal pain: Secondary | ICD-10-CM | POA: Diagnosis not present

## 2023-09-05 DIAGNOSIS — N858 Other specified noninflammatory disorders of uterus: Secondary | ICD-10-CM | POA: Diagnosis not present

## 2023-09-05 DIAGNOSIS — R935 Abnormal findings on diagnostic imaging of other abdominal regions, including retroperitoneum: Secondary | ICD-10-CM | POA: Diagnosis not present

## 2023-09-05 DIAGNOSIS — E039 Hypothyroidism, unspecified: Secondary | ICD-10-CM | POA: Insufficient documentation

## 2023-09-05 DIAGNOSIS — N184 Chronic kidney disease, stage 4 (severe): Secondary | ICD-10-CM | POA: Insufficient documentation

## 2023-09-05 DIAGNOSIS — G8929 Other chronic pain: Secondary | ICD-10-CM

## 2023-09-05 DIAGNOSIS — Z794 Long term (current) use of insulin: Secondary | ICD-10-CM | POA: Diagnosis not present

## 2023-09-05 DIAGNOSIS — K3184 Gastroparesis: Secondary | ICD-10-CM | POA: Insufficient documentation

## 2023-09-05 DIAGNOSIS — R1084 Generalized abdominal pain: Secondary | ICD-10-CM | POA: Insufficient documentation

## 2023-09-05 DIAGNOSIS — R161 Splenomegaly, not elsewhere classified: Secondary | ICD-10-CM | POA: Diagnosis not present

## 2023-09-05 DIAGNOSIS — R112 Nausea with vomiting, unspecified: Secondary | ICD-10-CM | POA: Diagnosis not present

## 2023-09-05 DIAGNOSIS — I5022 Chronic systolic (congestive) heart failure: Secondary | ICD-10-CM | POA: Diagnosis not present

## 2023-09-05 DIAGNOSIS — Z7982 Long term (current) use of aspirin: Secondary | ICD-10-CM | POA: Insufficient documentation

## 2023-09-05 DIAGNOSIS — E1122 Type 2 diabetes mellitus with diabetic chronic kidney disease: Secondary | ICD-10-CM | POA: Insufficient documentation

## 2023-09-05 DIAGNOSIS — I130 Hypertensive heart and chronic kidney disease with heart failure and stage 1 through stage 4 chronic kidney disease, or unspecified chronic kidney disease: Secondary | ICD-10-CM | POA: Insufficient documentation

## 2023-09-05 LAB — URINALYSIS, ROUTINE W REFLEX MICROSCOPIC
Bilirubin Urine: NEGATIVE
Bilirubin Urine: NEGATIVE
Glucose, UA: >1000 mg/dL — AB
Glucose, UA: >1000 mg/dL — AB
Ketones, ur: NEGATIVE mg/dL
Ketones, ur: NEGATIVE mg/dL
Nitrite: NEGATIVE
Nitrite: NEGATIVE
Protein, ur: 100 mg/dL — AB
Protein, ur: 30 mg/dL — AB
Specific Gravity, Urine: 1.012 (ref 1.005–1.030)
Specific Gravity, Urine: 1.012 (ref 1.005–1.030)
pH: 7 (ref 5.0–8.0)
pH: 6.5 (ref 5.0–8.0)

## 2023-09-05 LAB — I-STAT VENOUS BLOOD GAS, ED
Acid-base deficit: 1 mmol/L (ref 0.0–2.0)
Bicarbonate: 24.4 mmol/L (ref 20.0–28.0)
Calcium, Ion: 1.3 mmol/L (ref 1.15–1.40)
HCT: 34 % — ABNORMAL LOW (ref 36.0–46.0)
Hemoglobin: 11.6 g/dL — ABNORMAL LOW (ref 12.0–15.0)
O2 Saturation: 52 %
Potassium: 4.7 mmol/L (ref 3.5–5.1)
Sodium: 137 mmol/L (ref 135–145)
TCO2: 26 mmol/L (ref 22–32)
pCO2, Ven: 41.3 mmHg — ABNORMAL LOW (ref 44–60)
pH, Ven: 7.379 (ref 7.25–7.43)
pO2, Ven: 28 mmHg — CL (ref 32–45)

## 2023-09-05 LAB — COMPREHENSIVE METABOLIC PANEL WITH GFR
ALT: 72 U/L — ABNORMAL HIGH (ref 0–44)
ALT: 89 U/L — ABNORMAL HIGH (ref 0–44)
AST: 44 U/L — ABNORMAL HIGH (ref 15–41)
AST: 82 U/L — ABNORMAL HIGH (ref 15–41)
Albumin: 5.1 g/dL — ABNORMAL HIGH (ref 3.5–5.0)
Albumin: 5 g/dL (ref 3.5–5.0)
Alkaline Phosphatase: 170 U/L — ABNORMAL HIGH (ref 38–126)
Alkaline Phosphatase: 154 U/L — ABNORMAL HIGH (ref 38–126)
Anion gap: 17 — ABNORMAL HIGH (ref 5–15)
Anion gap: 15 (ref 5–15)
BUN: 41 mg/dL — ABNORMAL HIGH (ref 6–20)
BUN: 39 mg/dL — ABNORMAL HIGH (ref 6–20)
CO2: 22 mmol/L (ref 22–32)
CO2: 22 mmol/L (ref 22–32)
Calcium: 10.2 mg/dL (ref 8.9–10.3)
Calcium: 10.6 mg/dL — ABNORMAL HIGH (ref 8.9–10.3)
Chloride: 100 mmol/L (ref 98–111)
Chloride: 100 mmol/L (ref 98–111)
Creatinine, Ser: 1.89 mg/dL — ABNORMAL HIGH (ref 0.44–1.00)
Creatinine, Ser: 1.92 mg/dL — ABNORMAL HIGH (ref 0.44–1.00)
GFR, Estimated: 36 mL/min — ABNORMAL LOW (ref >60)
GFR, Estimated: 35 mL/min — ABNORMAL LOW (ref >60)
Glucose, Bld: 278 mg/dL — ABNORMAL HIGH (ref 70–99)
Glucose, Bld: 319 mg/dL — ABNORMAL HIGH (ref 70–99)
Potassium: 4.3 mmol/L (ref 3.5–5.1)
Potassium: 4.8 mmol/L (ref 3.5–5.1)
Sodium: 139 mmol/L (ref 135–145)
Sodium: 137 mmol/L (ref 135–145)
Total Bilirubin: 0.3 mg/dL (ref 0.0–1.2)
Total Bilirubin: 0.5 mg/dL (ref 0.0–1.2)
Total Protein: 8.5 g/dL — ABNORMAL HIGH (ref 6.5–8.1)
Total Protein: 8.3 g/dL — ABNORMAL HIGH (ref 6.5–8.1)

## 2023-09-05 LAB — CBC
HCT: 36.3 % (ref 36.0–46.0)
Hemoglobin: 12.1 g/dL (ref 12.0–15.0)
MCH: 29.4 pg (ref 26.0–34.0)
MCHC: 33.3 g/dL (ref 30.0–36.0)
MCV: 88.1 fL (ref 80.0–100.0)
Platelets: 301 10*3/uL (ref 150–400)
RBC: 4.12 MIL/uL (ref 3.87–5.11)
RDW: 13.6 % (ref 11.5–15.5)
WBC: 9.8 10*3/uL (ref 4.0–10.5)
nRBC: 0 % (ref 0.0–0.2)

## 2023-09-05 LAB — CBG MONITORING, ED: Glucose-Capillary: 270 mg/dL — ABNORMAL HIGH (ref 70–99)

## 2023-09-05 LAB — LIPASE, BLOOD
Lipase: 23 U/L (ref 11–51)
Lipase: 23 U/L (ref 11–51)

## 2023-09-05 LAB — CBC WITH DIFFERENTIAL/PLATELET
Abs Immature Granulocytes: 0.03 10*3/uL (ref 0.00–0.07)
Basophils Absolute: 0.1 10*3/uL (ref 0.0–0.1)
Basophils Relative: 1 %
Eosinophils Absolute: 0.3 10*3/uL (ref 0.0–0.5)
Eosinophils Relative: 4 %
HCT: 34.7 % — ABNORMAL LOW (ref 36.0–46.0)
Hemoglobin: 11.4 g/dL — ABNORMAL LOW (ref 12.0–15.0)
Immature Granulocytes: 0 %
Lymphocytes Relative: 34 %
Lymphs Abs: 2.5 10*3/uL (ref 0.7–4.0)
MCH: 29.4 pg (ref 26.0–34.0)
MCHC: 32.9 g/dL (ref 30.0–36.0)
MCV: 89.4 fL (ref 80.0–100.0)
Monocytes Absolute: 0.4 10*3/uL (ref 0.1–1.0)
Monocytes Relative: 5 %
Neutro Abs: 4.1 10*3/uL (ref 1.7–7.7)
Neutrophils Relative %: 56 %
Platelets: 247 10*3/uL (ref 150–400)
RBC: 3.88 MIL/uL (ref 3.87–5.11)
RDW: 13.7 % (ref 11.5–15.5)
WBC: 7.5 10*3/uL (ref 4.0–10.5)
nRBC: 0 % (ref 0.0–0.2)

## 2023-09-05 LAB — PREGNANCY, URINE
Preg Test, Ur: NEGATIVE
Preg Test, Ur: NEGATIVE

## 2023-09-05 MED ORDER — HYDROMORPHONE HCL 1 MG/ML IJ SOLN
1.0000 mg | Freq: Once | INTRAMUSCULAR | Status: AC
  Administered 2023-09-05: 1 mg via INTRAVENOUS
  Filled 2023-09-05: qty 1

## 2023-09-05 MED ORDER — HYDROMORPHONE HCL 1 MG/ML IJ SOLN
0.5000 mg | Freq: Once | INTRAMUSCULAR | Status: AC
  Administered 2023-09-05: 0.5 mg via INTRAVENOUS
  Filled 2023-09-05: qty 1

## 2023-09-05 MED ORDER — DIPHENHYDRAMINE HCL 50 MG/ML IJ SOLN
25.0000 mg | Freq: Once | INTRAMUSCULAR | Status: AC
  Administered 2023-09-05: 25 mg via INTRAVENOUS
  Filled 2023-09-05: qty 1

## 2023-09-05 MED ORDER — METOCLOPRAMIDE HCL 5 MG/ML IJ SOLN
10.0000 mg | Freq: Once | INTRAMUSCULAR | Status: AC
  Administered 2023-09-05: 10 mg via INTRAVENOUS
  Filled 2023-09-05: qty 2

## 2023-09-05 MED ORDER — OXYCODONE HCL 5 MG PO TABS: 5.0000 mg | ORAL_TABLET | ORAL | 0 refills | Status: DC | PRN

## 2023-09-05 MED ORDER — ONDANSETRON HCL 4 MG/2ML IJ SOLN
4.0000 mg | Freq: Once | INTRAMUSCULAR | Status: AC
  Administered 2023-09-05: 4 mg via INTRAVENOUS
  Filled 2023-09-05: qty 2

## 2023-09-05 MED ORDER — PANTOPRAZOLE SODIUM 40 MG IV SOLR
40.0000 mg | Freq: Once | INTRAVENOUS | Status: AC
  Administered 2023-09-05: 40 mg via INTRAVENOUS
  Filled 2023-09-05: qty 10

## 2023-09-05 MED ORDER — DICYCLOMINE HCL 10 MG/ML IM SOLN
20.0000 mg | Freq: Once | INTRAMUSCULAR | Status: AC
  Administered 2023-09-05: 20 mg via INTRAMUSCULAR
  Filled 2023-09-05: qty 2

## 2023-09-05 MED ORDER — DICYCLOMINE HCL 20 MG PO TABS: 20.0000 mg | ORAL_TABLET | Freq: Two times a day (BID) | ORAL | 0 refills | Status: AC

## 2023-09-05 MED ORDER — LORAZEPAM 2 MG/ML IJ SOLN
1.0000 mg | Freq: Once | INTRAMUSCULAR | Status: AC
  Administered 2023-09-05: 1 mg via INTRAVENOUS
  Filled 2023-09-05: qty 1

## 2023-09-05 NOTE — ED Provider Notes (Signed)
 [place] [facility]ENC[facility] DEPARTMENT AT [facility] Provider Note   CSN: [id number] Arrival date & time: 09/05/23  0141     History  Chief Complaint  Patient presents with   Abdominal Pain    Jennifer [name] is a 33 y.o. female.  Patient presents to the emergency department for evaluation of abdominal pain.  Patient reports diffuse mid abdominal pain with nausea and vomiting.  Reports being admitted to the hospital last month and has been experiencing pain since discharge.  Symptoms worsened tonight.       Home Medications Prior to Admission medications   Medication Sig Start Date End Date Taking? Authorizing Provider  amitriptyline  (ELAVIL ) 10 MG tablet Take 10 mg by mouth at bedtime.    [provider]  aspirin  EC 81 MG EC tablet Take 1 tablet (81 mg total) by mouth daily. Swallow whole. 12/07/20   Montey Apa, DO  azelastine  (ASTELIN ) 0.1 % nasal spray Place 2 sprays into both nostrils daily as needed for rhinitis or allergies.    [provider]  BAQSIMI ONE PACK 3 MG/DOSE POWD Place 1 Dose into the nose once as needed (hypoglycemia). 07/31/23   [provider]  BLISOVI  FE 1.5/30 1.5-30 MG-MCG tablet Take 1 tablet by mouth daily.    [provider]  butalbital -acetaminophen -caffeine  (FIORICET ) 50-325-40 MG tablet Take 1 tablet by mouth daily as needed for headache. 09/26/22   [provider]  calcitRIOL  (ROCALTROL ) 0.25 MCG capsule Take 0.25 mcg by mouth daily. 08/04/23   [provider]  carvedilol  (COREG ) 6.25 MG tablet Take 1.5 tablets (9.375 mg total) by mouth 2 (two) times daily. 08/07/23   Milford, Arlice Bene, FNP  Continuous Glucose Sensor (DEXCOM G7 SENSOR) MISC Inject 1 Device into the skin See admin instructions. Place 1 new sensor into the skin every 10 days 06/28/23   [provider]  CRANBERRY CONCENTRATE PO Take 1 tablet by mouth daily at 12 noon.    [provider]  CREON  36000-114000 units  CPEP capsule Take 1 capsule (36,000 Units total) by mouth 3 (three) times daily. 05/10/23   Rai, Hurman Maiden, MD  DULoxetine  (CYMBALTA ) 60 MG capsule Take 60 mg by mouth at bedtime. 09/26/22   [provider]  ergocalciferol  (VITAMIN D2) 1.25 MG (50000 UT) capsule Take 50,000 Units by mouth every Sunday.    [provider]  fenofibrate  (TRICOR ) 145 MG tablet Take 1 tablet (145 mg total) by mouth daily. 09/03/23 09/02/24  Armenta Landau, MD  fenofibrate  54 MG tablet Take 1 tablet (54 mg total) by mouth daily. 12/01/22   [name], Aviva Lemmings, MD  Glucose Blood (BLOOD GLUCOSE TEST STRIPS) STRP 1 each by Does not apply route 3 (three) times daily. Use as directed to check blood sugar. May dispense any manufacturer covered by patient's insurance and fits patient's device. 08/26/23   Shalhoub, Merrill Abide, MD  HUMALOG  KWIKPEN 100 UNIT/ML KwikPen Inject 60-95 Units into the skin See admin instructions. Inject 60-95 units into the skin three times a day before meals, per sliding scale    [provider]  HUMULIN  R U-500 KWIKPEN 500 UNIT/ML KwikPen Inject 195-300 Units into the skin See admin instructions. 195 am bef breakfast300 hs    [provider]  hydrALAZINE  (APRESOLINE ) 50 MG tablet Take 50 mg by mouth daily.    [provider]  Insulin  Pen Needle (PEN NEEDLES) 31G X 5 MM MISC Use as directed 3 (three) times daily. 08/26/23   Shalhoub,  Merrill Abide, MD  ivabradine  (CORLANOR ) 7.5 MG TABS tablet Take 1 tablet (7.5 mg total) by mouth 2 (two) times daily with a meal. 01/31/23   Milford, Arlice Bene, FNP  Lancet Device MISC 1 each by Does not apply route 3 (three) times daily. May dispense any manufacturer covered by patient's insurance. 08/26/23   Shalhoub, Merrill Abide, MD  Lancets MISC 1 each by Does not apply route 3 (three) times daily. Use as directed to check blood sugar. May dispense any manufacturer covered by patient's insurance and fits patient's device. 08/26/23   Shalhoub, Merrill Abide, MD  levocetirizine (XYZAL ) 5 MG tablet Take 5 mg by mouth at bedtime. 03/05/22   [provider]  levothyroxine  (SYNTHROID ) 150 MCG tablet Take 150 mcg by mouth daily before breakfast.    [provider]  linezolid (ZYVOX) 600 MG tablet Take 1 tablet (600 mg total) by mouth every 12 (twelve) hours for 6 days. 09/03/23 09/09/23  Armenta Landau, MD  metoCLOPramide  (REGLAN ) 5 MG tablet Take 1 tablet (5 mg total) by mouth every 8 (eight) hours as needed for nausea or vomiting. 05/10/23 05/09/24  Rai, Hurman Maiden, MD  Multiple Vitamin (MULTIVITAMIN WITH MINERALS) TABS tablet Take 1 tablet by mouth daily.    [provider]  oxyCODONE  (OXY IR/ROXICODONE ) 5 MG immediate release tablet Take 1 tablet (5 mg total) by mouth every 6 (six) hours as needed for severe pain (pain score 7-10). 08/26/23   Shalhoub, Merrill Abide, MD  pantoprazole  (PROTONIX ) 40 MG tablet Take 40 mg by mouth daily before breakfast. 12/24/19   [provider]  polyethylene glycol (MIRALAX  / GLYCOLAX ) 17 g packet Take 17 g by mouth daily as needed for mild constipation. Also available OTC 05/10/23   Rai, Hurman Maiden, MD  pregabalin  (LYRICA ) 300 MG capsule Take 300 mg by mouth at bedtime.    [provider]  prochlorperazine  (COMPAZINE ) 10 MG tablet Take 10 mg by mouth at bedtime. Preventative    [provider]  promethazine  (PHENERGAN ) 25 MG tablet Take 1 tablet (25 mg total) by mouth every 6 (six) hours as needed for nausea or vomiting. 08/14/23   Edmonia Gottron, PA-C  REPATHA  SURECLICK 140 MG/ML SOAJ Inject 140 mg into the skin every 14 (fourteen) days. 08/10/23   [provider]  rosuvastatin  (CRESTOR ) 10 MG tablet Take 1 tablet (10 mg total) by mouth daily. 09/04/23   Armenta Landau, MD  Safety Seal Miscellaneous MISC Hormonic Hair Solution with minoxidil USP 7% and finasteride USP 0.05% - apply to affected areas daily every morning. 06/06/23   Dellar Fenton, DO  sodium  bicarbonate 650 MG tablet Take 2 tablets (1,300 mg total) by mouth 2 (two) times daily. 05/10/23   Rai, Hurman Maiden, MD  topiramate  (TOPAMAX ) 50 MG tablet Take 50 mg by mouth 2 (two) times daily.    [provider]  torsemide  (DEMADEX ) 20 MG tablet Take 2 tablets (40 mg total) by mouth daily. 09/03/23   Armenta Landau, MD  TYLENOL  500 MG tablet Take 500-1,000 mg by mouth every 6 (six) hours as needed for mild pain (pain score 1-3) (or headaches).    [provider]      Allergies    Icosapent  ethyl (epa ethyl ester) (fish), Ketamine, [place], Morphine , Penicillins, Mushroom, Shellfish allergy, Fentanyl , and Prednisone    Review of Systems   Review of Systems  Physical Exam Updated Vital Signs BP (!) 144/93   Pulse 94  Temp 97.8 F (36.6 C)   Resp 18   Ht 4\' 9"  (1.448 m)   Wt 63.5 kg   LMP 09/01/2023   SpO2 95%   BMI 30.30 kg/m  Physical Exam Vitals and nursing note reviewed.  Constitutional:      General: [name] not in acute distress.    Appearance: [name] well-developed.  HENT:     Head: Normocephalic and atraumatic.     Mouth/Throat:     Mouth: Mucous membranes are moist.  Eyes:     General: Vision grossly intact. Gaze aligned appropriately.     Extraocular Movements: Extraocular movements intact.     Conjunctiva/sclera: Conjunctivae normal.  Cardiovascular:     Rate and Rhythm: Normal rate and regular rhythm.     Pulses: Normal pulses.     Heart sounds: Normal heart sounds, S1 normal and S2 normal. No murmur heard.    No friction rub. No gallop.  Pulmonary:     Effort: Pulmonary effort is normal. No respiratory distress.     Breath sounds: Normal breath sounds.  Abdominal:     General: Bowel sounds are normal.     Palpations: Abdomen is soft.     Tenderness: There is generalized abdominal tenderness. There is no guarding or rebound.     Hernia: No hernia is present.  Musculoskeletal:        General: No swelling.     Cervical back: Full  passive range of motion without pain, normal range of motion and neck supple. No spinous process tenderness or muscular tenderness. Normal range of motion.     Right lower leg: No edema.     Left lower leg: No edema.  Skin:    General: Skin is warm and dry.     Capillary Refill: Capillary refill takes less than 2 seconds.     Findings: No ecchymosis, erythema, rash or wound.  Neurological:     General: No focal deficit present.     Mental Status: [name] alert and oriented to person, place, and time.     GCS: GCS eye subscore is 4. GCS verbal subscore is 5. GCS motor subscore is 6.     Cranial Nerves: Cranial nerves 2-12 are intact.     Sensory: Sensation is intact.     Motor: Motor function is intact.     Coordination: Coordination is intact.  Psychiatric:        Attention and Perception: Attention normal.        Mood and Affect: Mood normal.        Speech: Speech normal.        Behavior: Behavior normal.     ED Results / Procedures / Treatments   Labs (all labs ordered are listed, but only abnormal results are displayed) Labs Reviewed  COMPREHENSIVE METABOLIC PANEL WITH GFR - Abnormal; Notable for the following components:      Result Value   Glucose, Bld 278 (*)    BUN 41 (*)    Creatinine, Ser 1.89 (*)    Total Protein 8.5 (*)    Albumin  5.1 (*)    AST 44 (*)    ALT 72 (*)    Alkaline Phosphatase 170 (*)    GFR, Estimated 36 (*)    Anion gap 17 (*)    All other components within normal limits  URINALYSIS, ROUTINE W REFLEX MICROSCOPIC - Abnormal; Notable for the following components:   Glucose, UA >1,000 (*)    Hgb urine dipstick  TRACE (*)    Protein, ur 100 (*)    Leukocytes,Ua MODERATE (*)    All other components within normal limits  CBG MONITORING, ED - Abnormal; Notable for the following components:   Glucose-Capillary 270 (*)    All other components within normal limits  LIPASE, BLOOD  CBC  PREGNANCY, URINE    EKG EKG Interpretation Date/Time:  Tuesday  September 05 2023 03:24:56 EDT Ventricular Rate:  103 PR Interval:  162 QRS Duration:  95 QT Interval:  368 QTC Calculation: 482 R Axis:   48  Text Interpretation: Sinus tachycardia Consider right atrial enlargement Minimal ST depression, lateral leads Borderline prolonged QT interval Confirmed by Ballard Bongo [id number]) on 09/05/2023 3:39:44 AM  Radiology No results found.  Procedures Procedures    Medications Ordered in ED Medications  HYDROmorphone  (DILAUDID ) injection 1 mg (1 mg Intravenous Given 09/05/23 0324)  metoCLOPramide  (REGLAN ) injection 10 mg (10 mg Intravenous Given 09/05/23 0400)  diphenhydrAMINE  (BENADRYL ) injection 25 mg (25 mg Intravenous Given 09/05/23 0359)  HYDROmorphone  (DILAUDID ) injection 1 mg (1 mg Intravenous Given 09/05/23 6045)    ED Course/ Medical Decision Making/ A&P                                 Medical Decision Making Amount and/or Complexity of Data Reviewed Labs: ordered.  Risk Prescription drug management.   Differential Diagnosis considered includes, but not limited to: Cholelithiasis; cholecystitis; cholangitis; bowel obstruction; esophagitis; gastritis; peptic ulcer disease; pancreatitis; chronic pain  Patient presents to the emergency department for evaluation of abdominal pain, vomiting.  Patient has a history of chronic abdominal pain, chronic pancreatitis, gastroparesis with recurrent nausea and vomiting.  Patient was just admitted this week for same.  She has had extensive workups including endoscopy during this most recent hospital stay.  No etiology has been determined other than gastroparesis.  Patient reports diffuse abdominal pain.  Exam is benign, no guarding, rebound or signs of peritonitis.  Workup is reassuring.  I do not feel she requires imaging.  Blood pressure improved after pain control.  I do not feel that the patient requires hospitalization at this time.         Final Clinical Impression(s) / ED  Diagnoses Final diagnoses:  Chronic abdominal pain  Gastroparesis    Rx / DC Orders ED Discharge Orders     None         Ballard Bongo, MD 09/05/23 [phone]

## 2023-09-05 NOTE — ED Triage Notes (Signed)
 Pt POV reporting persistent upper abd pain after being discharged from hospital 5/8 for same. Also reports n/v, unable to keep anything down since leaving hospital.

## 2023-09-05 NOTE — ED Provider Notes (Signed)
 [place] [facility] Provider Note   CSN: [id number] Arrival date & time: 09/05/23  1412     History {Add pertinent medical, surgical, social history, OB history to HPI:1} Chief Complaint  Patient presents with   Abdominal Pain    Jennifer [name] is a 33 y.o. female.  HPI     33 year old female with a history of type 2 diabetes, chronic pancreatitis, orthostatic hypotension, CKD stage III, chronic systolic congestive heart failure, familial hypertriglyceridemia, hypertension, hypothyroidism, OSA, recent hospitalization from May 20 to June 2 for severe abdominal pain, on insulin  infusion due to concerns of hypertriglyceridemia induced pancreatitis, cardiogenic volume overload, AKI, right groin abscess, early this morning, who presents again with abdominal pain   12PM today pain started again, began initially 2 days ago Similar pain to the past, feels like pancreatitis All over abdominal pain, sharp. Eating makes it worse. Nausea and vomiting, vomited 4 times today No fever No urinary symptoms No vaginl bleeding or discharge No new cp or dyspnea  Had taken meds today , changed bp meds in the hospital, decreased it is always high here  Past Medical History:  Diagnosis Date   Afib (HCC) 05/12/2021   Brain tumor (HCC) 03/29/1995   astrocytoma   CHF (congestive heart failure) (HCC)    Cholesterosis    CKD (chronic kidney disease) stage 4, GFR 15-29 ml/min (HCC) 05/13/2021   DM (diabetes mellitus) (HCC) 10/10/2018   Fatty liver    Gastroparesis due to DM (HCC) 05/12/2021   HTN (hypertension) 10/10/2018   Hypothyroidism 10/10/2018   Lipoprotein deficiency    Lung disease    longevity long term   Pancreatitis    Polycystic ovary syndrome      Home Medications Prior to Admission medications   Medication Sig Start Date End Date Taking? Authorizing Provider  amitriptyline  (ELAVIL ) 10 MG tablet Take 10 mg by mouth at bedtime.     [provider]  aspirin  EC 81 MG EC tablet Take 1 tablet (81 mg total) by mouth daily. Swallow whole. 12/07/20   Montey Apa, DO  azelastine  (ASTELIN ) 0.1 % nasal spray Place 2 sprays into both nostrils daily as needed for rhinitis or allergies.    [provider]  BAQSIMI ONE PACK 3 MG/DOSE POWD Place 1 Dose into the nose once as needed (hypoglycemia). 07/31/23   [provider]  BLISOVI  FE 1.5/30 1.5-30 MG-MCG tablet Take 1 tablet by mouth daily.    [provider]  butalbital -acetaminophen -caffeine  (FIORICET ) 50-325-40 MG tablet Take 1 tablet by mouth daily as needed for headache. 09/26/22   [provider]  calcitRIOL  (ROCALTROL ) 0.25 MCG capsule Take 0.25 mcg by mouth daily. 08/04/23   [provider]  carvedilol  (COREG ) 6.25 MG tablet Take 1.5 tablets (9.375 mg total) by mouth 2 (two) times daily. 08/07/23   Milford, Arlice Bene, FNP  Continuous Glucose Sensor (DEXCOM G7 SENSOR) MISC Inject 1 Device into the skin See admin instructions. Place 1 new sensor into the skin every 10 days 06/28/23   [provider]  CRANBERRY CONCENTRATE PO Take 1 tablet by mouth daily at 12 noon.    [provider]  CREON  36000-114000 units CPEP capsule Take 1 capsule (36,000 Units total) by mouth 3 (three) times daily. 05/10/23   Rai, Hurman Maiden, MD  DULoxetine  (CYMBALTA ) 60 MG capsule Take 60 mg by mouth at bedtime. 09/26/22   [provider]  ergocalciferol  (VITAMIN D2) 1.25 MG (50000 UT) capsule Take 50,000  Units by mouth every Sunday.    [provider]  fenofibrate  (TRICOR ) 145 MG tablet Take 1 tablet (145 mg total) by mouth daily. 09/03/23 09/02/24  Armenta Landau, MD  fenofibrate  54 MG tablet Take 1 tablet (54 mg total) by mouth daily. 12/01/22   [name], Aviva Lemmings, MD  Glucose Blood (BLOOD GLUCOSE TEST STRIPS) STRP 1 each by Does not apply route 3 (three) times daily. Use as directed to check blood sugar. May dispense any  manufacturer covered by patient's insurance and fits patient's device. 08/26/23   Shalhoub, Merrill Abide, MD  HUMALOG  KWIKPEN 100 UNIT/ML KwikPen Inject 60-95 Units into the skin See admin instructions. Inject 60-95 units into the skin three times a day before meals, per sliding scale    [provider]  HUMULIN  R U-500 KWIKPEN 500 UNIT/ML KwikPen Inject 195-300 Units into the skin See admin instructions. 195 am bef breakfast300 hs    [provider]  hydrALAZINE  (APRESOLINE ) 50 MG tablet Take 50 mg by mouth daily.    [provider]  Insulin  Pen Needle (PEN NEEDLES) 31G X 5 MM MISC Use as directed 3 (three) times daily. 08/26/23   Shalhoub, Merrill Abide, MD  ivabradine  (CORLANOR ) 7.5 MG TABS tablet Take 1 tablet (7.5 mg total) by mouth 2 (two) times daily with a meal. 01/31/23   Milford, Arlice Bene, FNP  Lancet Device MISC 1 each by Does not apply route 3 (three) times daily. May dispense any manufacturer covered by patient's insurance. 08/26/23   Shalhoub, Merrill Abide, MD  Lancets MISC 1 each by Does not apply route 3 (three) times daily. Use as directed to check blood sugar. May dispense any manufacturer covered by patient's insurance and fits patient's device. 08/26/23   Shalhoub, Merrill Abide, MD  levocetirizine (XYZAL ) 5 MG tablet Take 5 mg by mouth at bedtime. 03/05/22   [provider]  levothyroxine  (SYNTHROID ) 150 MCG tablet Take 150 mcg by mouth daily before breakfast.    [provider]  linezolid (ZYVOX) 600 MG tablet Take 1 tablet (600 mg total) by mouth every 12 (twelve) hours for 6 days. 09/03/23 09/09/23  Armenta Landau, MD  metoCLOPramide  (REGLAN ) 5 MG tablet Take 1 tablet (5 mg total) by mouth every 8 (eight) hours as needed for nausea or vomiting. 05/10/23 05/09/24  Rai, Hurman Maiden, MD  Multiple Vitamin (MULTIVITAMIN WITH MINERALS) TABS tablet Take 1 tablet by mouth daily.    [provider]  oxyCODONE  (OXY IR/ROXICODONE ) 5 MG immediate release tablet  Take 1 tablet (5 mg total) by mouth every 6 (six) hours as needed for severe pain (pain score 7-10). 08/26/23   Shalhoub, Merrill Abide, MD  pantoprazole  (PROTONIX ) 40 MG tablet Take 40 mg by mouth daily before breakfast. 12/24/19   [provider]  polyethylene glycol (MIRALAX  / GLYCOLAX ) 17 g packet Take 17 g by mouth daily as needed for mild constipation. Also available OTC 05/10/23   Rai, Hurman Maiden, MD  pregabalin  (LYRICA ) 300 MG capsule Take 300 mg by mouth at bedtime.    [provider]  prochlorperazine  (COMPAZINE ) 10 MG tablet Take 10 mg by mouth at bedtime. Preventative    [provider]  promethazine  (PHENERGAN ) 25 MG tablet Take 1 tablet (25 mg total) by mouth every 6 (six) hours as needed for nausea or vomiting. 08/14/23   Edmonia Gottron, PA-C  REPATHA  SURECLICK 140 MG/ML SOAJ Inject 140 mg into the skin every 14 (fourteen) days. 08/10/23  [provider]  rosuvastatin  (CRESTOR ) 10 MG tablet Take 1 tablet (10 mg total) by mouth daily. 09/04/23   Armenta Landau, MD  Safety Seal Miscellaneous MISC Hormonic Hair Solution with minoxidil USP 7% and finasteride USP 0.05% - apply to affected areas daily every morning. 06/06/23   Dellar Fenton, DO  sodium bicarbonate  650 MG tablet Take 2 tablets (1,300 mg total) by mouth 2 (two) times daily. 05/10/23   Rai, Hurman Maiden, MD  topiramate  (TOPAMAX ) 50 MG tablet Take 50 mg by mouth 2 (two) times daily.    [provider]  torsemide  (DEMADEX ) 20 MG tablet Take 2 tablets (40 mg total) by mouth daily. 09/03/23   Armenta Landau, MD  TYLENOL  500 MG tablet Take 500-1,000 mg by mouth every 6 (six) hours as needed for mild pain (pain score 1-3) (or headaches).    [provider]      Allergies    Icosapent  ethyl (epa ethyl ester) (fish), Ketamine, [place], Morphine , Penicillins, Mushroom, Shellfish allergy, Fentanyl , and Prednisone    Review of Systems   Review of Systems  Physical Exam Updated  Vital Signs BP (!) 188/94 (BP Location: Right Arm)   Pulse 89   Temp 98.5 F (36.9 C)   Resp 16   Ht 4\' 9"  (1.448 m)   Wt 63.5 kg   LMP 09/01/2023   SpO2 98%   BMI 30.30 kg/m  Physical Exam  ED Results / Procedures / Treatments   Labs (all labs ordered are listed, but only abnormal results are displayed) Labs Reviewed - No data to display  EKG None  Radiology No results found.  Procedures Procedures  {Document cardiac monitor, telemetry assessment procedure when appropriate:1}  Medications Ordered in ED Medications - No data to display  ED Course/ Medical Decision Making/ A&P   {   Click here for ABCD2, HEART and other calculatorsREFRESH Note before signing :1}                              Medical Decision Making Amount and/or Complexity of Data Reviewed Labs: ordered.   ***  Labs obtained show normal lipase, sodium of 139, glucose of 278, bicarb of 22, creatinine of 1.89 from previous of 2.3, stable mild transaminitis, no leukocytosis, urinalysis with glucosuria ketones, neg pregnancy test.  Last CT abdomen was March 26  {Document critical care time when appropriate:1} {Document review of labs and clinical decision tools ie heart score, Chads2Vasc2 etc:1}  {Document your independent review of radiology images, and any outside records:1} {Document your discussion with family members, caretakers, and with consultants:1} {Document social determinants of health affecting pt's care:1} {Document your decision making why or why not admission, treatments were needed:1} Final Clinical Impression(s) / ED Diagnoses Final diagnoses:  None    Rx / DC Orders ED Discharge Orders     None

## 2023-09-05 NOTE — ED Notes (Signed)
 I-Stat blood given to RRT.

## 2023-09-05 NOTE — ED Triage Notes (Signed)
 C/o ABD pain that has been "getting worse". Seen here this morning for same and states "pain meds did not help" Hx of pancreatitis.

## 2023-09-06 ENCOUNTER — Other Ambulatory Visit: Payer: Self-pay

## 2023-09-06 ENCOUNTER — Encounter (HOSPITAL_COMMUNITY): Payer: Self-pay

## 2023-09-06 ENCOUNTER — Emergency Department (HOSPITAL_COMMUNITY)
Admission: EM | Admit: 2023-09-06 | Discharge: 2023-09-06 | Disposition: A | Discharge status: Home / Self Care | Attending: Emergency Medicine | Admitting: Emergency Medicine

## 2023-09-06 DIAGNOSIS — E039 Hypothyroidism, unspecified: Secondary | ICD-10-CM | POA: Diagnosis not present

## 2023-09-06 DIAGNOSIS — N184 Chronic kidney disease, stage 4 (severe): Secondary | ICD-10-CM | POA: Insufficient documentation

## 2023-09-06 DIAGNOSIS — Z7982 Long term (current) use of aspirin: Secondary | ICD-10-CM | POA: Insufficient documentation

## 2023-09-06 DIAGNOSIS — Z794 Long term (current) use of insulin: Secondary | ICD-10-CM | POA: Insufficient documentation

## 2023-09-06 DIAGNOSIS — E1122 Type 2 diabetes mellitus with diabetic chronic kidney disease: Secondary | ICD-10-CM | POA: Diagnosis not present

## 2023-09-06 DIAGNOSIS — I130 Hypertensive heart and chronic kidney disease with heart failure and stage 1 through stage 4 chronic kidney disease, or unspecified chronic kidney disease: Secondary | ICD-10-CM | POA: Diagnosis not present

## 2023-09-06 DIAGNOSIS — Z79899 Other long term (current) drug therapy: Secondary | ICD-10-CM | POA: Diagnosis not present

## 2023-09-06 DIAGNOSIS — Z8616 Personal history of COVID-19: Secondary | ICD-10-CM | POA: Insufficient documentation

## 2023-09-06 DIAGNOSIS — I2510 Atherosclerotic heart disease of native coronary artery without angina pectoris: Secondary | ICD-10-CM | POA: Diagnosis not present

## 2023-09-06 DIAGNOSIS — I5043 Acute on chronic combined systolic (congestive) and diastolic (congestive) heart failure: Secondary | ICD-10-CM | POA: Diagnosis not present

## 2023-09-06 DIAGNOSIS — G8929 Other chronic pain: Secondary | ICD-10-CM | POA: Diagnosis not present

## 2023-09-06 DIAGNOSIS — R112 Nausea with vomiting, unspecified: Secondary | ICD-10-CM | POA: Diagnosis not present

## 2023-09-06 DIAGNOSIS — R1013 Epigastric pain: Secondary | ICD-10-CM | POA: Diagnosis not present

## 2023-09-06 LAB — COMPREHENSIVE METABOLIC PANEL WITH GFR
ALT: 73 U/L — ABNORMAL HIGH (ref 0–44)
AST: 47 U/L — ABNORMAL HIGH (ref 15–41)
Albumin: 5.1 g/dL — ABNORMAL HIGH (ref 3.5–5.0)
Alkaline Phosphatase: 136 U/L — ABNORMAL HIGH (ref 38–126)
Anion gap: 12 (ref 5–15)
BUN: 44 mg/dL — ABNORMAL HIGH (ref 6–20)
CO2: 21 mmol/L — ABNORMAL LOW (ref 22–32)
Calcium: 10.1 mg/dL (ref 8.9–10.3)
Chloride: 101 mmol/L (ref 98–111)
Creatinine, Ser: 2.4 mg/dL — ABNORMAL HIGH (ref 0.44–1.00)
GFR, Estimated: 27 mL/min — ABNORMAL LOW (ref >60)
Glucose, Bld: 369 mg/dL — ABNORMAL HIGH (ref 70–99)
Potassium: 5.1 mmol/L (ref 3.5–5.1)
Sodium: 134 mmol/L — ABNORMAL LOW (ref 135–145)
Total Bilirubin: 1 mg/dL (ref 0.0–1.2)
Total Protein: 9 g/dL — ABNORMAL HIGH (ref 6.5–8.1)

## 2023-09-06 LAB — CBC
HCT: 39.3 % (ref 36.0–46.0)
Hemoglobin: 12.7 g/dL (ref 12.0–15.0)
MCH: 29.3 pg (ref 26.0–34.0)
MCHC: 32.3 g/dL (ref 30.0–36.0)
MCV: 90.6 fL (ref 80.0–100.0)
Platelets: 304 10*3/uL (ref 150–400)
RBC: 4.34 MIL/uL (ref 3.87–5.11)
RDW: 13.3 % (ref 11.5–15.5)
WBC: 9.1 10*3/uL (ref 4.0–10.5)
nRBC: 0 % (ref 0.0–0.2)

## 2023-09-06 LAB — LIPASE, BLOOD: Lipase: 29 U/L (ref 11–51)

## 2023-09-06 LAB — HCG, SERUM, QUALITATIVE: Preg, Serum: NEGATIVE

## 2023-09-06 MED ORDER — OXYCODONE HCL 5 MG PO TABS
5.0000 mg | ORAL_TABLET | Freq: Once | ORAL | Status: AC
  Administered 2023-09-06: 5 mg via ORAL
  Filled 2023-09-06: qty 1

## 2023-09-06 MED ORDER — ACETAMINOPHEN 500 MG PO TABS
1000.0000 mg | ORAL_TABLET | Freq: Once | ORAL | Status: AC
  Administered 2023-09-06: 1000 mg via ORAL
  Filled 2023-09-06: qty 2

## 2023-09-06 MED ORDER — METOCLOPRAMIDE HCL 10 MG PO TABS: 10.0000 mg | ORAL_TABLET | Freq: Four times a day (QID) | ORAL | 0 refills | Status: DC

## 2023-09-06 MED ORDER — DIPHENHYDRAMINE HCL 25 MG PO CAPS
25.0000 mg | ORAL_CAPSULE | Freq: Once | ORAL | Status: AC
  Administered 2023-09-06: 25 mg via ORAL
  Filled 2023-09-06: qty 1

## 2023-09-06 MED ORDER — METOCLOPRAMIDE HCL 5 MG/ML IJ SOLN
10.0000 mg | Freq: Once | INTRAMUSCULAR | Status: AC
  Administered 2023-09-06: 10 mg via INTRAVENOUS
  Filled 2023-09-06: qty 2

## 2023-09-06 MED ORDER — SODIUM CHLORIDE 0.9 % IV SOLN
25.0000 mg | Freq: Once | INTRAVENOUS | Status: AC
  Administered 2023-09-06: 25 mg via INTRAVENOUS
  Filled 2023-09-06: qty 25

## 2023-09-06 MED ORDER — SUCRALFATE 1 GM/10ML PO SUSP: 1.0000 g | Freq: Three times a day (TID) | ORAL | 0 refills | Status: DC

## 2023-09-06 NOTE — ED Triage Notes (Signed)
 Abdominal pain for the past 5 days, n/v/d as well. Not able to hold anything down.

## 2023-09-06 NOTE — ED Notes (Signed)
 Patient aware of the need of urine sample-does not have the urge to void at this time. Urine cup at bedside.

## 2023-09-06 NOTE — Discharge Instructions (Signed)
 I have sent your prescription for Reglan .  You can take this every 6 hours for abdominal pain.  Have also sent you Carafate .  You can take this medication 3 times per day with meals.  Follow-up with your GI doctor and your PCP.  Return to the emergency room for worsening pain in your abdomen or fever.

## 2023-09-06 NOTE — ED Provider Notes (Signed)
 [place] EMERGENCY DEPARTMENT AT [facility] Provider Note  CSN: [id number] Arrival date & time: 09/06/23 1751  Chief Complaint(s) Abdominal Pain  HPI Jennifer [name] is a 33 y.o. female well-known to the emergency room here today for 5 days of abdominal pain, nausea and vomiting.  Patient history of chronic gastroparesis, pancreatitis.  Patient seen in the ED 2 times yesterday.  She had CT imaging which was negative for acute process.  She endorses pain in her epigastric region.   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   CHF (congestive heart failure) (HCC)    Cholesterosis    CKD (chronic kidney disease) stage 4, GFR 15-29 ml/min (HCC) 05/13/2021   DM (diabetes mellitus) (HCC) 10/10/2018   Fatty liver    Gastroparesis due to DM (HCC) 05/12/2021   HTN (hypertension) 10/10/2018   Hypothyroidism 10/10/2018   Lipoprotein deficiency    Lung disease    longevity long term   Pancreatitis    Polycystic ovary syndrome    Patient Active Problem List   Diagnosis Date Noted   Controlled type 2 diabetes mellitus with hyperglycemia (HCC) 08/31/2023   Nausea without vomiting 08/31/2023   Epigastric pain 08/31/2023   Sleep apnea 08/30/2023   Abscess 08/30/2023   SOB (shortness of breath) 08/20/2023   Dizziness 08/20/2023   Chronic pancreatitis (HCC) 08/15/2023   Witnessed episode of apnea 06/27/2023   Pseudohyponatremia 04/10/2023   Metabolic acidosis 04/10/2023   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 coli UTI (urinary tract infection) 06/01/2022   DKA (diabetic ketoacidosis) (HCC) 06/01/2022   ASCUS of cervix with negative high risk HPV 05/31/2022   Acute on chronic pancreatitis (HCC) 05/29/2022   Hypokalemia 03/23/2022   COVID 03/15/2022   COVID-19 virus infection 03/14/2022   Hyponatremia 03/14/2022   Positive D dimer 03/14/2022   Stage 3b chronic kidney disease (CKD) (HCC) 03/14/2022   Pulmonary hypertension (HCC) 02/06/2022   NASH (nonalcoholic steatohepatitis) 02/06/2022   Obesity (BMI 30-39.9) 02/06/2022   Heart failure (HCC) 12/03/2021   Acute on chronic systolic CHF (congestive heart failure) (HCC) 12/02/2021   Elevated troponin 12/02/2021   Pancytopenia (HCC) 12/02/2021   Amenorrhea 12/02/2021   Hepatic steatosis 09/27/2021   Hyperosmolar hyperglycemic state (HHS) (HCC) 08/26/2021   Small intestinal bacterial overgrowth (SIBO) 08/26/2021   Lower extremity edema 08/26/2021   Hyperkalemia 08/14/2021   Hx of insulin  dependent diabetes mellitus 08/14/2021   CKD stage 3b, GFR 30-44 ml/min (HCC) 05/13/2021   Gastroparesis due to DM (HCC) 05/12/2021   Abdominal distension 05/12/2021   Chest pain 03/10/2021   Acute on chronic combined systolic and diastolic CHF (congestive heart failure) (HCC) 02/09/2021   History of astrocytoma of brain 02/09/2021   Type 2 diabetes mellitus with hyperglycemia, with long-term current use of insulin  (HCC) 01/25/2021   Diarrhea    Elevated transaminase level    Orthostatic hypotension 11/28/2020   Acute combined systolic and  diastolic heart failure (HCC)    Nonischemic cardiomyopathy (HCC)    Acute systolic congestive heart failure (HCC)    Elevated liver enzymes    Acute renal failure superimposed on stage 3b chronic kidney disease (HCC) 11/26/2020   Intractable abdominal pain 11/23/2020    Chronic systolic congestive heart failure (HCC) 11/23/2020   Essential hypertension 11/23/2020   Familial hypertriglyceridemia 11/23/2020   Prolonged QT interval 11/23/2020   Finger mass, left 08/12/2020   Nausea and vomiting 07/23/2020   Polycystic ovary syndrome 01/30/2020   Moderate aortic insufficiency 08/16/2019   Moderate mitral regurgitation 08/16/2019   Lipoprotein lipase deficiency, familial 06/19/2019   Microalbuminuria due to type 1 diabetes mellitus (HCC) 06/19/2019   Type 2 diabetes mellitus with hyperlipidemia (HCC) 10/10/2018   Hypothyroidism 10/10/2018   Home Medication(s) Prior to Admission medications   Medication Sig Start Date End Date Taking? Authorizing Provider  metoCLOPramide  (REGLAN ) 10 MG tablet Take 1 tablet (10 mg total) by mouth every 6 (six) hours. 09/06/23  Yes Afton Horse T, DO  sucralfate  (CARAFATE ) 1 GM/10ML suspension Take 10 mLs (1 g total) by mouth 4 (four) times daily -  with meals and at bedtime. 09/06/23  Yes Afton Horse T, DO  amitriptyline  (ELAVIL ) 10 MG tablet Take 10 mg by mouth at bedtime.    [provider]  aspirin  EC 81 MG EC tablet Take 1 tablet (81 mg total) by mouth daily. Swallow whole. 12/07/20   Montey Apa, DO  azelastine  (ASTELIN ) 0.1 % nasal spray Place 2 sprays into both nostrils daily as needed for rhinitis or allergies.    [provider]  BAQSIMI ONE PACK 3 MG/DOSE POWD Place 1 Dose into the nose once as needed (hypoglycemia). 07/31/23   [provider]  BLISOVI  FE 1.5/30 1.5-30 MG-MCG tablet Take 1 tablet by mouth daily.    [provider]  butalbital -acetaminophen -caffeine  (FIORICET ) 50-325-40 MG tablet Take 1 tablet by mouth daily as needed for headache. 09/26/22   [provider]  calcitRIOL  (ROCALTROL ) 0.25 MCG capsule Take 0.25 mcg by mouth daily. 08/04/23   [provider]  carvedilol  (COREG ) 6.25 MG tablet Take 1.5 tablets (9.375 mg total) by mouth 2 (two) times  daily. 08/07/23   Milford, Arlice Bene, FNP  Continuous Glucose Sensor (DEXCOM G7 SENSOR) MISC Inject 1 Device into the skin See admin instructions. Place 1 new sensor into the skin every 10 days 06/28/23   [provider]  CRANBERRY CONCENTRATE PO Take 1 tablet by mouth daily at 12 noon.    [provider]  CREON  36000-114000 units CPEP capsule Take 1 capsule (36,000 Units total) by mouth 3 (three) times daily. 05/10/23   Rai, Hurman Maiden, MD  dicyclomine  (BENTYL ) 20 MG tablet Take 1 tablet (20 mg total) by mouth 2 (two) times daily. 09/05/23   Scarlette Currier, MD  DULoxetine  (CYMBALTA ) 60 MG capsule Take 60 mg by mouth at bedtime. 09/26/22   [provider]  ergocalciferol  (VITAMIN D2) 1.25 MG (50000 UT) capsule Take 50,000 Units by mouth every Sunday.    [provider]  fenofibrate  (TRICOR ) 145 MG tablet Take 1 tablet (145 mg total) by mouth daily. 09/03/23 09/02/24  Armenta Landau, MD  fenofibrate  54 MG tablet Take 1 tablet (54 mg total) by mouth daily. 12/01/22   [name], Aviva Lemmings, MD  Glucose Blood (BLOOD GLUCOSE TEST STRIPS) STRP 1 each by Does not apply route 3 (three) times daily. Use as directed to check blood sugar. May  dispense any manufacturer covered by patient's insurance and fits patient's device. 08/26/23   Shalhoub, Merrill Abide, MD  HUMALOG  KWIKPEN 100 UNIT/ML KwikPen Inject 60-95 Units into the skin See admin instructions. Inject 60-95 units into the skin three times a day before meals, per sliding scale    [provider]  HUMULIN  R U-500 KWIKPEN 500 UNIT/ML KwikPen Inject 195-300 Units into the skin See admin instructions. 195 am bef breakfast300 hs    [provider]  hydrALAZINE  (APRESOLINE ) 50 MG tablet Take 50 mg by mouth daily.    [provider]  Insulin  Pen Needle (PEN NEEDLES) 31G X 5 MM MISC Use as directed 3 (three) times daily. 08/26/23   Shalhoub, Merrill Abide, MD  ivabradine  (CORLANOR ) 7.5 MG TABS tablet Take 1 tablet (7.5 mg  total) by mouth 2 (two) times daily with a meal. 01/31/23   Milford, Arlice Bene, FNP  Lancet Device MISC 1 each by Does not apply route 3 (three) times daily. May dispense any manufacturer covered by patient's insurance. 08/26/23   Shalhoub, Merrill Abide, MD  Lancets MISC 1 each by Does not apply route 3 (three) times daily. Use as directed to check blood sugar. May dispense any manufacturer covered by patient's insurance and fits patient's device. 08/26/23   Shalhoub, Merrill Abide, MD  levocetirizine (XYZAL ) 5 MG tablet Take 5 mg by mouth at bedtime. 03/05/22   [provider]  levothyroxine  (SYNTHROID ) 150 MCG tablet Take 150 mcg by mouth daily before breakfast.    [provider]  linezolid (ZYVOX) 600 MG tablet Take 1 tablet (600 mg total) by mouth every 12 (twelve) hours for 6 days. 09/03/23 09/09/23  Armenta Landau, MD  Multiple Vitamin (MULTIVITAMIN WITH MINERALS) TABS tablet Take 1 tablet by mouth daily.    [provider]  oxyCODONE  (OXY IR/ROXICODONE ) 5 MG immediate release tablet Take 1 tablet (5 mg total) by mouth every 6 (six) hours as needed for severe pain (pain score 7-10). 08/26/23   Shalhoub, Merrill Abide, MD  oxyCODONE  (ROXICODONE ) 5 MG immediate release tablet Take 1 tablet (5 mg total) by mouth every 4 (four) hours as needed for severe pain (pain score 7-10). 09/05/23   Scarlette Currier, MD  pantoprazole  (PROTONIX ) 40 MG tablet Take 40 mg by mouth daily before breakfast. 12/24/19   [provider]  polyethylene glycol (MIRALAX  / GLYCOLAX ) 17 g packet Take 17 g by mouth daily as needed for mild constipation. Also available OTC 05/10/23   Rai, Ripudeep K, MD  pregabalin  (LYRICA ) 300 MG capsule Take 300 mg by mouth at bedtime.    [provider]  prochlorperazine  (COMPAZINE ) 10 MG tablet Take 10 mg by mouth at bedtime. Preventative    [provider]  promethazine  (PHENERGAN ) 25 MG tablet Take 1 tablet (25 mg total) by mouth every 6 (six) hours as  needed for nausea or vomiting. 08/14/23   Edmonia Gottron, PA-C  REPATHA  SURECLICK 140 MG/ML SOAJ Inject 140 mg into the skin every 14 (fourteen) days. 08/10/23   [provider]  rosuvastatin  (CRESTOR ) 10 MG tablet Take 1 tablet (10 mg total) by mouth daily. 09/04/23   Armenta Landau, MD  Safety Seal Miscellaneous MISC Hormonic Hair Solution with minoxidil USP 7% and finasteride USP 0.05% - apply to affected areas daily every morning. 06/06/23   Dellar Fenton, DO  sodium bicarbonate  650 MG tablet Take 2 tablets (1,300 mg total) by mouth 2 (two) times daily. 05/10/23   Rai,  Ripudeep K, MD  topiramate  (TOPAMAX ) 50 MG tablet Take 50 mg by mouth 2 (two) times daily.    [provider]  torsemide  (DEMADEX ) 20 MG tablet Take 2 tablets (40 mg total) by mouth daily. 09/03/23   Armenta Landau, MD  TYLENOL  500 MG tablet Take 500-1,000 mg by mouth every 6 (six) hours as needed for mild pain (pain score 1-3) (or headaches).    [provider]                                                                                                                                    Past Surgical History Past Surgical History:  Procedure Laterality Date   ABDOMINAL SURGERY     pt states during miscarriage got her intestine   BRAIN SURGERY     ESOPHAGOGASTRODUODENOSCOPY N/A 09/01/2023   Procedure: EGD (ESOPHAGOGASTRODUODENOSCOPY);  Surgeon: Tobin Forts, MD;  Location: [facility];  Service: Gastroenterology;  Laterality: N/A;   EYE MUSCLE SURGERY Right 03/28/2014   PRESSURE SENSOR/CARDIOMEMS N/A 12/06/2021   Procedure: PRESSURE SENSOR/CARDIOMEMS;  Surgeon: Mardell Shade, MD;  Location: [facility];  Service: Cardiovascular;  Laterality: N/A;   RIGHT HEART CATH N/A 08/05/2021   Procedure: RIGHT HEART CATH;  Surgeon: Mardell Shade, MD;  Location: [facility];  Service: Cardiovascular;  Laterality: N/A;   RIGHT HEART CATH N/A 12/06/2021   Procedure: RIGHT HEART  CATH;  Surgeon: Mardell Shade, MD;  Location: [facility];  Service: Cardiovascular;  Laterality: N/A;   RIGHT/LEFT HEART CATH AND CORONARY ANGIOGRAPHY N/A 12/03/2020   Procedure: RIGHT/LEFT HEART CATH AND CORONARY ANGIOGRAPHY;  Surgeon: Knox Perl, MD;  Location: [facility];  Service: Cardiovascular;  Laterality: N/A;   VENTRICULOSTOMY  03/28/1997   Family History Family History  Problem Relation Age of Onset   Diabetes Mother    Hypertension Mother    Hyperlipidemia Mother    Thyroid  disease Mother    Hypertension Father    Diabetes Father    Pancreatic cancer Paternal Aunt    Pancreatic cancer Paternal Uncle    Colon cancer Neg Hx    Esophageal cancer Neg Hx    Stomach cancer Neg Hx     Social History Social History   Tobacco Use   Smoking status: Never   Smokeless tobacco: Never  Vaping Use   Vaping status: Never Used  Substance Use Topics   Alcohol  use: Never   Drug use: Never   Allergies Icosapent  ethyl (epa ethyl ester) (fish), Ketamine, [place], Morphine , Penicillins, Mushroom, Shellfish allergy, Fentanyl , and Prednisone  Review of Systems Review of Systems  Physical Exam Vital Signs  I have reviewed the triage vital signs BP (!) 178/90   Pulse 100   Temp 97.6 F (36.4 C)   Resp 18   Ht 4' 9 (1.448 m)   Wt 63.5 kg   LMP 09/01/2023  SpO2 98%   BMI 30.30 kg/m   Physical Exam Vitals and nursing note reviewed.  Cardiovascular:     Rate and Rhythm: Normal rate.  Abdominal:     Palpations: Abdomen is soft.     Tenderness: There is no abdominal tenderness. There is no guarding. Negative signs include Murphy's sign and McBurney's sign.  Neurological:     Mental Status: [name] alert.     ED Results and Treatments Labs (all labs ordered are listed, but only abnormal results are displayed) Labs Reviewed  COMPREHENSIVE METABOLIC PANEL WITH GFR - Abnormal; Notable for the following components:      Result Value   Sodium 134 (*)     CO2 21 (*)    Glucose, Bld 369 (*)    BUN 44 (*)    Creatinine, Ser 2.40 (*)    Total Protein 9.0 (*)    Albumin  5.1 (*)    AST 47 (*)    ALT 73 (*)    Alkaline Phosphatase 136 (*)    GFR, Estimated 27 (*)    All other components within normal limits  LIPASE, BLOOD  CBC  HCG, SERUM, QUALITATIVE                                                                                                                          Radiology No results found.   Pertinent labs & imaging results that were available during my care of the patient were reviewed by me and considered in my medical decision making (see MDM for details).  Medications Ordered in ED Medications  promethazine  (PHENERGAN ) 25 mg in sodium chloride  0.9 % 50 mL IVPB (25 mg Intravenous New Bag/Given 09/06/23 2130)  diphenhydrAMINE  (BENADRYL ) capsule 25 mg (has no administration in time range)  metoCLOPramide  (REGLAN ) injection 10 mg (10 mg Intravenous Given 09/06/23 1828)  acetaminophen  (TYLENOL ) tablet 1,000 mg (1,000 mg Oral Given 09/06/23 1828)  oxyCODONE  (Oxy IR/ROXICODONE ) immediate release tablet 5 mg (5 mg Oral Given 09/06/23 2046)                                                                                                                                     Procedures Procedures  (including critical care time)  Medical Decision Making / ED Course   This patient presents to the ED for concern of abdominal pain, this involves an extensive number of treatment  options, and is a complaint that carries with it a high risk of complications and morbidity.  The differential diagnosis includes chronic pancreatitis, gastritis, gastroparesis, chronic abdominal pain.  MDM: On exam, patient overall well-appearing.  Vital signs normal.  She has a soft abdomen.  When I enter the room, patient not have any vomiting, does not appear to be in any acute distress.  Reviewed the patient's imaging from yesterday.  Will read the patient  with some Reglan  for presumed gastroparesis.  Basic labs ordered.  Patient requested pain medication.  Discussed with the patient how at this time and I do not believe IV narcotics were indicated, but we will treat her pain with other modalities.  Reassessment 9:30 PM-blood work overall normal.  Abdomen remains soft.  Patient has not had any episodes of emesis since being in the ED.  Patient requesting IV Dilaudid .  Discussed with the patient how is concerned about providing this medication given her overall reassuring exam, negative imaging yesterday and blood work.  Discussed with the patient how opioids can delay gastric emptying and worsen symptoms of gastroparesis if used chronically, and also discussed my concern about a developing tolerance for these medications for the patient.  Patient then requested Benadryl , ordered her p.o. Benadryl .  Will discharge patient with prescriptions for Carafate , Reglan .  She tells me she will take it.   Additional history obtained:  -External records from outside source obtained and reviewed including: Chart review including previous notes, labs, imaging, consultation notes   Lab Tests: -I ordered, reviewed, and interpreted labs.   The pertinent results include:   Labs Reviewed  COMPREHENSIVE METABOLIC PANEL WITH GFR - Abnormal; Notable for the following components:      Result Value   Sodium 134 (*)    CO2 21 (*)    Glucose, Bld 369 (*)    BUN 44 (*)    Creatinine, Ser 2.40 (*)    Total Protein 9.0 (*)    Albumin  5.1 (*)    AST 47 (*)    ALT 73 (*)    Alkaline Phosphatase 136 (*)    GFR, Estimated 27 (*)    All other components within normal limits  LIPASE, BLOOD  CBC  HCG, SERUM, QUALITATIVE        Medicines ordered and prescription drug management: Meds ordered this encounter  Medications   metoCLOPramide  (REGLAN ) injection 10 mg   acetaminophen  (TYLENOL ) tablet 1,000 mg   oxyCODONE  (Oxy IR/ROXICODONE ) immediate release tablet 5 mg     Refill:  0   promethazine  (PHENERGAN ) 25 mg in sodium chloride  0.9 % 50 mL IVPB   diphenhydrAMINE  (BENADRYL ) capsule 25 mg   metoCLOPramide  (REGLAN ) 10 MG tablet    Sig: Take 1 tablet (10 mg total) by mouth every 6 (six) hours.    Dispense:  30 tablet    Refill:  0   sucralfate  (CARAFATE ) 1 GM/10ML suspension    Sig: Take 10 mLs (1 g total) by mouth 4 (four) times daily -  with meals and at bedtime.    Dispense:  420 mL    Refill:  0    -I have reviewed the patients home medicines and have made adjustments as needed    Cardiac Monitoring: The patient was maintained on a cardiac monitor.  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     Final Clinical Impression(s) / ED Diagnoses Final diagnoses:  Chronic abdominal pain     @PCDICTATION @    Nathanael Baker, DO 09/06/23 2141

## 2023-09-06 NOTE — ED Notes (Signed)
 RN reviewed discharge instructions with pt. Pt verbalized understanding and had no further questions. VSS upon discharge.

## 2023-09-21 DIAGNOSIS — R300 Dysuria: Secondary | ICD-10-CM | POA: Diagnosis not present

## 2023-09-21 DIAGNOSIS — E1159 Type 2 diabetes mellitus with other circulatory complications: Secondary | ICD-10-CM | POA: Diagnosis not present

## 2023-09-21 DIAGNOSIS — R399 Unspecified symptoms and signs involving the genitourinary system: Secondary | ICD-10-CM | POA: Diagnosis not present

## 2023-09-21 DIAGNOSIS — I152 Hypertension secondary to endocrine disorders: Secondary | ICD-10-CM | POA: Diagnosis not present

## 2023-09-27 ENCOUNTER — Encounter (HOSPITAL_COMMUNITY)

## 2023-09-28 ENCOUNTER — Encounter: Payer: Self-pay | Admitting: Gastroenterology

## 2023-09-28 ENCOUNTER — Ambulatory Visit (INDEPENDENT_AMBULATORY_CARE_PROVIDER_SITE_OTHER): Admitting: Gastroenterology

## 2023-09-28 ENCOUNTER — Other Ambulatory Visit

## 2023-09-28 VITALS — BP 112/64 | HR 64 | Ht <= 58 in | Wt 142.0 lb

## 2023-09-28 DIAGNOSIS — E1143 Type 2 diabetes mellitus with diabetic autonomic (poly)neuropathy: Secondary | ICD-10-CM

## 2023-09-28 DIAGNOSIS — G8929 Other chronic pain: Secondary | ICD-10-CM

## 2023-09-28 DIAGNOSIS — Z131 Encounter for screening for diabetes mellitus: Secondary | ICD-10-CM

## 2023-09-28 DIAGNOSIS — R140 Abdominal distension (gaseous): Secondary | ICD-10-CM

## 2023-09-28 DIAGNOSIS — R112 Nausea with vomiting, unspecified: Secondary | ICD-10-CM

## 2023-09-28 DIAGNOSIS — R6881 Early satiety: Secondary | ICD-10-CM

## 2023-09-28 DIAGNOSIS — Z794 Long term (current) use of insulin: Secondary | ICD-10-CM

## 2023-09-28 DIAGNOSIS — R1013 Epigastric pain: Secondary | ICD-10-CM

## 2023-09-28 DIAGNOSIS — K3184 Gastroparesis: Secondary | ICD-10-CM | POA: Diagnosis not present

## 2023-09-28 LAB — HEMOGLOBIN A1C: Hgb A1c MFr Bld: 8.8 % — ABNORMAL HIGH (ref 4.6–6.5)

## 2023-09-28 MED ORDER — PROMETHAZINE HCL 12.5 MG PO TABS: 12.5000 mg | ORAL_TABLET | Freq: Two times a day (BID) | ORAL | 0 refills | Status: DC | PRN

## 2023-09-28 MED ORDER — PANTOPRAZOLE SODIUM 20 MG PO TBEC: 20.0000 mg | DELAYED_RELEASE_TABLET | Freq: Every day | ORAL | 2 refills | Status: DC

## 2023-09-28 NOTE — Patient Instructions (Addendum)
 Please look at website below for more recommendations on gastroparesis diet.  [email or web address]=en  Keep Ha1c below 7 Recommend gastroparesis diet  Take Promethazine  12.5 mg twice daily scheduled Take Pantoprazole  20mg  twice daily- take 1 tablet 30-45 minutes before breakfast and dinner I will discuss with Dr Federico if patient is candidate for electrical stimulator  Please go to the lab in the basement of our building to have lab work done as you leave today. Hit B for basement when you get on the elevator.  When the doors open the lab is on your left.  We will call you with the results. Thank you.   Thank you for entrusting me with your care and for choosing [facility], Deanna May, NP   If your blood pressure at your visit was 140/90 or greater, please contact your primary care physician to follow up on this. ______________________________________________________  If you are age 73 or older, your body mass index should be between 23-30. Your Body mass index is 30.73 kg/m. If this is out of the aforementioned range listed, please consider follow up with your Primary Care Provider.  If you are age 9 or younger, your body mass index should be between 19-25. Your Body mass index is 30.73 kg/m. If this is out of the aformentioned range listed, please consider follow up with your Primary Care Provider.  ________________________________________________________  The [facility] providers would like to encourage you to use MYCHART to communicate with providers for non-urgent requests or questions.  Due to long hold times on the telephone, sending your provider a message by [facility] may be a faster and more efficient way to get a response.  Please allow 48 business hours for a response.  Please remember that this is for non-urgent requests.   _______________________________________________________  Due to recent changes in healthcare laws, you may see the results of your imaging and laboratory studies on MyChart before your provider has had a chance to review them.  We understand that in some cases there may be results that are confusing or concerning to you. Not all laboratory results come back in the same time frame and the provider may be waiting for multiple results in order to interpret others.  Please give us  48 hours in order for your provider to thoroughly review all the results before contacting the office for clarification of your results.

## 2023-09-28 NOTE — Progress Notes (Signed)
 Chief Complaint:  Primary GI Doctor:Dr. Federico   HPI:   Jennifer [name] is a 33 y.o. female with medical history significant of hx of astrocytoma, chronic systolic CHF, QT prolongation, lipoprotein deficiency, familial hyperTG, NASH, CKD stage IV, T2DM, HTN, hypothyroidism, gastroparesis, peripheral neuropathy, GERD      08/31/2023 patient presented to ED with complaints of severe generalized abdominal pain and abscess on her leg.  GI consulted.  She was just discharged from the hospital on 5/31 after an 11-day hospital stay for hypertriglyceridemia, hyperglycemia, abdominal pain.  09/01/23 EGD with Dr. Abran 1. Normal EGD 2. No primary GI cause for pain identified or suspected 3. Multiple medical problems.  6/10 ED visit for abd pain with nausea and vomiting.  Exam is benign.  Workup reassuring.  Does not require imaging.  Labs show: Lipase 23, glucose 319, BUN 39, creatinine 1.9, AST 82, ALT 89, alk phos 154, hemoglobin 11.1. patient given Dilaudid , Reglan , Benadryl  and discharged home.  6/11 patient presents to ED with abdominal pain.  Patient was discharged home with Carafate  and Reglan .  Interval History    Patient presents today for follow-up.  Her main concern she reports today is her symptoms of gastroparesis. She states she has severe bloating and upper abdominal pain that radiates to her back. She reports she has been taking the Reglan  10 mg po daily at bedtime , Protonix  40 mg po daily in the morning, and promethazine  25 mg po daily at bedtime.  She tells me she thinks that the promethazine  seems to provide her with the most relief.  She was recently prescribed Carafate  and Reglan  at the most recent ED visit.  She completed the Carafate  and thinks it Jennifer [name] have helped some, but we discussed how the medication should not be used long-term due to her kidney issues.  We also discussed how the Reglan  long-term is not good due to history of QT prolongation. She reports she typically has her  largest meal later in the day which seems to be when her symptoms are the worst.  Patient will try to have small snacks during the day like yogurt but states she feels full after a couple of bites.  Patient inquires about other possible options for the gastroparesis.  We did discuss a gastroparesis diet which she is not familiar with.     She reports there have been issues with controlling her blood sugars and her endocrinologist is currently out of town.  I do not have a recent HA1C on her and would like to see where she is at.     Patient was also prescribed Creon  quite sometime ago for pancreatic insufficiency.  I could not find a stool elastase and patient does not recall ever completing one.  She reports she does have chronic diarrhea.  She tells me she stopped the Creon  with meals because it caused her abdominal pain to be worse.  We discussed ordering a stool elastase to see where her numbers are or if she needs the medication.  Wt Readings from Last 3 Encounters:  09/06/23 140 lb (63.5 kg)  09/05/23 140 lb (63.5 kg)  09/05/23 140 lb (63.5 kg)    Past Medical History:  Diagnosis Date   Afib (HCC) 05/12/2021   Brain tumor (HCC) 03/29/1995   astrocytoma   CHF (congestive heart failure) (HCC)    Cholesterosis    CKD (chronic kidney disease) stage 4, GFR 15-29 ml/min (HCC) 05/13/2021   DM (diabetes mellitus) (HCC) 10/10/2018  Fatty liver    Gastroparesis due to DM (HCC) 05/12/2021   HTN (hypertension) 10/10/2018   Hypothyroidism 10/10/2018   Lipoprotein deficiency    Lung disease    longevity long term   Pancreatitis    Polycystic ovary syndrome     Past Surgical History:  Procedure Laterality Date   ABDOMINAL SURGERY     pt states during miscarriage got her intestine   BRAIN SURGERY     ESOPHAGOGASTRODUODENOSCOPY N/A 09/01/2023   Procedure: EGD (ESOPHAGOGASTRODUODENOSCOPY);  Surgeon: Jennifer Norleen SAILOR, MD;  Location: [facility];  Service: Gastroenterology;  Laterality: N/A;   EYE  MUSCLE SURGERY Right 03/28/2014   PRESSURE SENSOR/CARDIOMEMS N/A 12/06/2021   Procedure: PRESSURE SENSOR/CARDIOMEMS;  Surgeon: Jennifer Toribio SAUNDERS, MD;  Location: [facility];  Service: Cardiovascular;  Laterality: N/A;   RIGHT HEART CATH N/A 08/05/2021   Procedure: RIGHT HEART CATH;  Surgeon: Jennifer Toribio SAUNDERS, MD;  Location: [facility];  Service: Cardiovascular;  Laterality: N/A;   RIGHT HEART CATH N/A 12/06/2021   Procedure: RIGHT HEART CATH;  Surgeon: Jennifer Toribio SAUNDERS, MD;  Location: [facility];  Service: Cardiovascular;  Laterality: N/A;   RIGHT/LEFT HEART CATH AND CORONARY ANGIOGRAPHY N/A 12/03/2020   Procedure: RIGHT/LEFT HEART CATH AND CORONARY ANGIOGRAPHY;  Surgeon: Jennifer Heinz, MD;  Location: [facility];  Service: Cardiovascular;  Laterality: N/A;   VENTRICULOSTOMY  03/28/1997    Current Outpatient Medications  Medication Sig Dispense Refill   amitriptyline  (ELAVIL ) 10 MG tablet Take 10 mg by mouth at bedtime.     aspirin  EC 81 MG EC tablet Take 1 tablet (81 mg total) by mouth daily. Swallow whole. 30 tablet 1   azelastine  (ASTELIN ) 0.1 % nasal spray Place 2 sprays into both nostrils daily as needed for rhinitis or allergies.     BAQSIMI ONE PACK 3 MG/DOSE POWD Place 1 Dose into the nose once as needed (hypoglycemia).     BLISOVI  FE 1.5/30 1.5-30 MG-MCG tablet Take 1 tablet by mouth daily.     butalbital -acetaminophen -caffeine  (FIORICET ) 50-325-40 MG tablet Take 1 tablet by mouth daily as needed for headache.     calcitRIOL  (ROCALTROL ) 0.25 MCG capsule Take 0.25 mcg by mouth daily.     carvedilol  (COREG ) 6.25 MG tablet Take 1.5 tablets (9.375 mg total) by mouth 2 (two) times daily. 270 tablet 3   Continuous Glucose Sensor (DEXCOM G7 SENSOR) MISC Inject 1 Device into the skin See admin instructions. Place 1 new sensor into the skin every 10 days     CRANBERRY CONCENTRATE PO Take 1 tablet by mouth daily at 12 noon.     CREON  36000-114000 units CPEP  capsule Take 1 capsule (36,000 Units total) by mouth 3 (three) times daily. 90 capsule 3   dicyclomine  (BENTYL ) 20 MG tablet Take 1 tablet (20 mg total) by mouth 2 (two) times daily. 20 tablet 0   DULoxetine  (CYMBALTA ) 60 MG capsule Take 60 mg by mouth at bedtime.     ergocalciferol  (VITAMIN D2) 1.25 MG (50000 UT) capsule Take 50,000 Units by mouth every Sunday.     fenofibrate  (TRICOR ) 145 MG tablet Take 1 tablet (145 mg total) by mouth daily. 30 tablet 11   fenofibrate  54 MG tablet Take 1 tablet (54 mg total) by mouth daily. 30 tablet 11   Glucose Blood (BLOOD GLUCOSE TEST STRIPS) STRP 1 each by Does not apply route 3 (three) times daily. Use as directed to check blood sugar. Halford Goetzke dispense any manufacturer  covered by patient's insurance and fits patient's device. 100 strip 0   HUMALOG  KWIKPEN 100 UNIT/ML KwikPen Inject 60-95 Units into the skin See admin instructions. Inject 60-95 units into the skin three times a day before meals, per sliding scale     HUMULIN  R U-500 KWIKPEN 500 UNIT/ML KwikPen Inject 195-300 Units into the skin See admin instructions. 195 am bef breakfast300 hs     hydrALAZINE  (APRESOLINE ) 50 MG tablet Take 50 mg by mouth daily.     Insulin  Pen Needle (PEN NEEDLES) 31G X 5 MM MISC Use as directed 3 (three) times daily. 100 each 0   ivabradine  (CORLANOR ) 7.5 MG TABS tablet Take 1 tablet (7.5 mg total) by mouth 2 (two) times daily with a meal. 60 tablet 6   Lancet Device MISC 1 each by Does not apply route 3 (three) times daily. Murvin Gift dispense any manufacturer covered by patient's insurance. 1 each 0   Lancets MISC 1 each by Does not apply route 3 (three) times daily. Use as directed to check blood sugar. Alando Colleran dispense any manufacturer covered by patient's insurance and fits patient's device. 100 each 0   levocetirizine (XYZAL ) 5 MG tablet Take 5 mg by mouth at bedtime.     levothyroxine  (SYNTHROID ) 150 MCG tablet Take 150 mcg by mouth daily before breakfast.     metoCLOPramide   (REGLAN ) 10 MG tablet Take 1 tablet (10 mg total) by mouth every 6 (six) hours. 30 tablet 0   Multiple Vitamin (MULTIVITAMIN WITH MINERALS) TABS tablet Take 1 tablet by mouth daily.     oxyCODONE  (OXY IR/ROXICODONE ) 5 MG immediate release tablet Take 1 tablet (5 mg total) by mouth every 6 (six) hours as needed for severe pain (pain score 7-10). 20 tablet 0   oxyCODONE  (ROXICODONE ) 5 MG immediate release tablet Take 1 tablet (5 mg total) by mouth every 4 (four) hours as needed for severe pain (pain score 7-10). 5 tablet 0   pantoprazole  (PROTONIX ) 40 MG tablet Take 40 mg by mouth daily before breakfast.     polyethylene glycol (MIRALAX  / GLYCOLAX ) 17 g packet Take 17 g by mouth daily as needed for mild constipation. Also available OTC 30 each 0   pregabalin  (LYRICA ) 300 MG capsule Take 300 mg by mouth at bedtime.     prochlorperazine  (COMPAZINE ) 10 MG tablet Take 10 mg by mouth at bedtime. Preventative     promethazine  (PHENERGAN ) 25 MG tablet Take 1 tablet (25 mg total) by mouth every 6 (six) hours as needed for nausea or vomiting. 90 tablet 2   REPATHA  SURECLICK 140 MG/ML SOAJ Inject 140 mg into the skin every 14 (fourteen) days.     rosuvastatin  (CRESTOR ) 10 MG tablet Take 1 tablet (10 mg total) by mouth daily. 90 tablet 0   Safety Seal Miscellaneous MISC Hormonic Hair Solution with minoxidil USP 7% and finasteride USP 0.05% - apply to affected areas daily every morning. 30 mL 2   sodium bicarbonate  650 MG tablet Take 2 tablets (1,300 mg total) by mouth 2 (two) times daily. 120 tablet 3   sucralfate  (CARAFATE ) 1 GM/10ML suspension Take 10 mLs (1 g total) by mouth 4 (four) times daily -  with meals and at bedtime. 420 mL 0   topiramate  (TOPAMAX ) 50 MG tablet Take 50 mg by mouth 2 (two) times daily.     torsemide  (DEMADEX ) 20 MG tablet Take 2 tablets (40 mg total) by mouth daily.     TYLENOL  500 MG tablet Take 500-1,000  mg by mouth every 6 (six) hours as needed for mild pain (pain score 1-3) (or  headaches).     No current facility-administered medications for this visit.    Allergies as of 09/28/2023 - Review Complete 09/06/2023  Allergen Reaction Noted   Icosapent  ethyl (epa ethyl ester) (fish) Hives 04/05/2023   Ketamine Other (See Comments) 11/22/2020   [place] Itching and Other (See Comments) 02/08/2021   Morphine  Hives, Itching, Rash, and Other (See Comments) 12/24/2019   Penicillins Hives, Itching, and Rash 11/25/2017   Mushroom Other (See Comments) 08/16/2023   Shellfish allergy Other (See Comments) 11/25/2017   Fentanyl  Nausea And Vomiting 01/25/2021   Prednisone Rash 03/26/2014    Family History  Problem Relation Age of Onset   Diabetes Mother    Hypertension Mother    Hyperlipidemia Mother    Thyroid  disease Mother    Hypertension Father    Diabetes Father    Pancreatic cancer Paternal Aunt    Pancreatic cancer Paternal Uncle    Colon cancer Neg Hx    Esophageal cancer Neg Hx    Stomach cancer Neg Hx     Review of Systems:    Constitutional: No weight loss, fever, chills, weakness or fatigue HEENT: Eyes: No change in vision               Ears, Nose, Throat:  No change in hearing or congestion Skin: No rash or itching Cardiovascular: No chest pain, chest pressure or palpitations   Respiratory: No SOB or cough Gastrointestinal: See HPI and otherwise negative Genitourinary: No dysuria or change in urinary frequency Neurological: No headache, dizziness or syncope Musculoskeletal: No new muscle or joint pain Hematologic: No bleeding or bruising Psychiatric: No history of depression or anxiety    Physical Exam:  Vital signs: LMP 09/01/2023   Constitutional:   Pleasant  female appears to be in NAD, Well developed, Well nourished, alert and cooperative Throat: Oral cavity and pharynx without inflammation, swelling or lesion.  Respiratory: Respirations even and unlabored. Lungs clear to auscultation bilaterally.   No wheezes, crackles, or rhonchi.   Cardiovascular: Normal S1, S2. Regular rate and rhythm. No peripheral edema, cyanosis or pallor.  Gastrointestinal:  Soft, nondistended, upper abdominal tenderness with palpation. No rebound or guarding. Normal bowel sounds. No appreciable masses or hepatomegaly. Rectal:  Not performed.  Msk:  Symmetrical without gross deformities. Without edema, no deformity or joint abnormality.  Neurologic:  Alert and  oriented x4;  grossly normal neurologically.  Skin:   Dry and intact without significant lesions or rashes. Psychiatric: Oriented to person, place and time. Demonstrates good judgement and reason without abnormal affect or behaviors.  RELEVANT LABS AND IMAGING: CBC    Latest Ref Rng & Units 09/06/2023    6:22 PM 09/05/2023    5:58 PM 09/05/2023    5:31 PM  CBC  WBC 4.0 - 10.5 K/uL 9.1   7.5   Hemoglobin 12.0 - 15.0 g/dL 87.2  88.3  88.5   Hematocrit 36.0 - 46.0 % 39.3  34.0  34.7   Platelets 150 - 400 K/uL 304   247      CMP     Latest Ref Rng & Units 09/06/2023    6:22 PM 09/05/2023    5:58 PM 09/05/2023    5:31 PM  CMP  Glucose 70 - 99 mg/dL 630   680   BUN 6 - 20 mg/dL 44   39   Creatinine 9.55 - 1.00 mg/dL 7.59  1.92   Sodium 135 - 145 mmol/L 134  137  137   Potassium 3.5 - 5.1 mmol/L 5.1  4.7  4.8   Chloride 98 - 111 mmol/L 101   100   CO2 22 - 32 mmol/L 21   22   Calcium  8.9 - 10.3 mg/dL 89.8   89.3   Total Protein 6.5 - 8.1 g/dL 9.0   8.3   Total Bilirubin 0.0 - 1.2 mg/dL 1.0   0.5   Alkaline Phos 38 - 126 U/L 136   154   AST 15 - 41 U/L 47   82   ALT 0 - 44 U/L 73   89      Lab Results  Component Value Date   TSH 6.758 (H) 08/19/2023  MRI abdomen/MRCP 4/49/2025 through [facility]:   IMPRESSION:  1. No significant change in a punctate cystic lesion in the pancreatic tail, potentially an IPMN. See follow-up guidelines below.  2.  Hepatic steatosis.  3.  Unchanged splenomegaly. Probable iron deposition in the spleen.    Upper EUS 05/2021 Impression: EGD  IMPRESSIONS: - Normal esophagus. - Normal stomach. Biopsied. - Normal examined duodenum. Biopsied. EUS IMPRESSIONS: - A cystic lesion was seen in the pancreatic body.  Small side branched cyst versus dilated side branch.  Correlate with MRCP. - No changes of chronic pancreatitis. - The pancreatic duct had a normal endosonographic  appearance in the pancreatic head, neck of the  pancreas and body of the pancreas. The duct in the  tail was not well visualized. - Trace ascites was found on endosonographic  examination of the peritoneal cavity. - The left lobe of the liver revealed no mass or  lesion. - The region of the celiac artery was normal.    EGD 01/08/20: Findings:  All observed locations appeared normal, including the esophagus, stomach  and duodenum. The esophagus, stomach and duodenum appeared normal.  Biopsies obtained of the antrum, incisura, lesser/greater curvature to  assess for H. Pylori.  Biopsies obtained of the duodenal bulb and descending duodenum to assess  for celiac disease.    GES in [place] 2022 showed delayed gastric emptying.   Assessment: Encounter Diagnoses  Name Primary?   Gastroparesis due to DM (HCC) Yes   Abdominal pain, chronic, epigastric    Bloating    Early satiety    Nausea and vomiting, unspecified vomiting type     33 year old female patient with history of gastroparesis due to poorly controlled diabetes.  Would like to recheck A1c today and we discussed how keeping the number below 7 would help reduce symptoms related to gastroparesis.  Will also provide information about gastroparesis diet.  I will try to spread out some of the patient's medications to twice daily to see if it provides her with better coverage for her symptoms.  The promethazine  I will reduce to 12.5 and add it into 2 doses morning and evening.  I will also decrease the pantoprazole  from 40 mg daily to 20 mg twice daily.  I recommended the patient not continue with the Reglan   long-term due to her history of QT prolongation.  Recent EGD done with Dr. Abran during hospitalization was normal. Patient interested in other interventions for gastroparesis, will discuss with Dr. Federico if she thinks she Kerron Sedano be a candidate for electrical stimulator. She has been in the ED on several occasions for these symptoms.      Patient is on Creon  for exocrine pancreatic insufficiency but reports she has not been  taking it as it causes abdominal pain.  I would like to get a baseline stool elastase to evaluate her numbers.    Plan: -collect stool elastase -keep Ha1c below 7 -recheck Ha1c  -recommend gastroparesis diet  -promethazine  12.5 mg twice daily scheduled -pantoprazole  20mg  twice daily- divide dose for better coverage -discuss with Dr Federico if patient is candidate for electrical stimulator  Thank you for the courtesy of this consult. Please call me with any questions or concerns.   Merlon Alcorta, FNP-C [facility] 09/28/2023, 1:42 PM  Cc: Emilio Joesph DEL, PA-C

## 2023-09-29 NOTE — Progress Notes (Signed)
 I agree with the assessment and plan as outlined by Ms. May. Recent CT A/P w/o contrast 08/2023 showed concern for a ill-defined hypodensity in the liver. Let's plan to get the patient scheduled for a MRI liver with Eovist contrast for further evaluation of this. Would also recommend that we check an AFP tumor marker. Could consider another course of SIBO treatment to see if this is contributing to her symptoms. Agree with focusing on gastroparesis diet as treatment for gastroparesis for now. Could refer to [facility] surgery for consideration of gastric pacemaker.

## 2023-10-02 ENCOUNTER — Ambulatory Visit: Payer: Self-pay | Admitting: Gastroenterology

## 2023-10-02 DIAGNOSIS — K580 Irritable bowel syndrome with diarrhea: Secondary | ICD-10-CM

## 2023-10-02 MED ORDER — RIFAXIMIN 550 MG PO TABS: 550.0000 mg | ORAL_TABLET | Freq: Three times a day (TID) | ORAL | 2 refills | Status: DC

## 2023-10-03 ENCOUNTER — Other Ambulatory Visit: Payer: Self-pay

## 2023-10-03 DIAGNOSIS — K760 Fatty (change of) liver, not elsewhere classified: Secondary | ICD-10-CM

## 2023-10-03 DIAGNOSIS — E1143 Type 2 diabetes mellitus with diabetic autonomic (poly)neuropathy: Secondary | ICD-10-CM

## 2023-10-04 MED ORDER — LANSOPRAZOLE 30 MG PO CPDR: 30.0000 mg | DELAYED_RELEASE_CAPSULE | Freq: Two times a day (BID) | ORAL | 2 refills | Status: DC

## 2023-10-04 NOTE — Addendum Note (Signed)
 Addended by: Gean Larose N on: 10/04/2023 01:39 PM   Modules accepted: Orders

## 2023-10-11 ENCOUNTER — Other Ambulatory Visit (HOSPITAL_COMMUNITY): Payer: Self-pay

## 2023-10-11 ENCOUNTER — Ambulatory Visit: Admitting: Primary Care

## 2023-10-11 ENCOUNTER — Other Ambulatory Visit: Payer: Self-pay | Admitting: Gastroenterology

## 2023-10-11 ENCOUNTER — Telehealth: Payer: Self-pay | Admitting: Podiatry

## 2023-10-11 ENCOUNTER — Telehealth: Payer: Self-pay

## 2023-10-11 DIAGNOSIS — K638219 Small intestinal bacterial overgrowth, unspecified: Secondary | ICD-10-CM

## 2023-10-11 MED ORDER — METRONIDAZOLE 250 MG PO TABS: 250.0000 mg | ORAL_TABLET | Freq: Three times a day (TID) | ORAL | 0 refills | Status: AC

## 2023-10-11 NOTE — Telephone Encounter (Signed)
 PA request has been Cancelled. New Encounter has been or will be created for follow up. For additional info see Pharmacy Prior Auth telephone encounter from 10-11-2023.

## 2023-10-11 NOTE — Telephone Encounter (Signed)
 Pharmacy Patient Advocate Encounter   Received notification from Patient Advice Request messages that prior authorization for Xifaxan  550 mg tablet is required/requested.   Insurance verification completed.   The patient is insured through [employer] .   Per test claim: The current 14 day co-pay is, $859.92**.  No PA needed at this time. This test claim was processed through [facility]- copay amounts may vary at other pharmacies due to pharmacy/plan contracts, or as the patient moves through the different stages of their insurance plan.     Patient insurance covers a 16 day supply transition fill so no PA required.  Patient is still in initial benefits stage of her plan so will have high co-pay for now.  Patient is eligible for the Medicare Prescription Payment Plan program which can help her with her co-pay. She will need to contact her insurance for further information on this and to sign up.   If patient would like to avoid high co-pay or calling insurance, suggest step therapy Metronidazole .

## 2023-10-11 NOTE — Telephone Encounter (Signed)
 Noted, information has been sent to [facility], NP. See 7/7 patient message

## 2023-10-11 NOTE — Telephone Encounter (Signed)
 Patient cannot find the information for Nerve testing ordered by Dr. Tobie back in March from last visit with Dr. Tobie 06/09/23. Please provide patient with information for Nerve testing and telephone number.

## 2023-10-11 NOTE — Progress Notes (Signed)
 Alternative abx for sibo- xifaxan  not covered

## 2023-10-16 ENCOUNTER — Encounter: Payer: Self-pay | Admitting: Family

## 2023-10-23 ENCOUNTER — Telehealth: Payer: Self-pay

## 2023-10-23 NOTE — Telephone Encounter (Signed)
 Jennifer [name],   I spoke with [facility] today and they aren't accepting gastroparesis patients at the moment. What would you like to do moving forward.

## 2023-11-01 ENCOUNTER — Ambulatory Visit: Admitting: Primary Care

## 2023-11-01 ENCOUNTER — Encounter: Payer: Self-pay | Admitting: Primary Care

## 2023-11-01 ENCOUNTER — Telehealth: Payer: Self-pay | Admitting: Podiatry

## 2023-11-01 VITALS — BP 148/84 | HR 77 | Ht <= 58 in | Wt 145.0 lb

## 2023-11-01 DIAGNOSIS — G4733 Obstructive sleep apnea (adult) (pediatric): Secondary | ICD-10-CM | POA: Diagnosis not present

## 2023-11-01 DIAGNOSIS — T148XXA Other injury of unspecified body region, initial encounter: Secondary | ICD-10-CM

## 2023-11-01 IMAGING — CR DG CHEST 1V PORT
1 series · 1 of 1 positions shown · non-contrast
Comparison: 08/26/2021

CLINICAL DATA: Chest pressure, dyspnea

EXAM:
PORTABLE CHEST 1 VIEW

[AP]
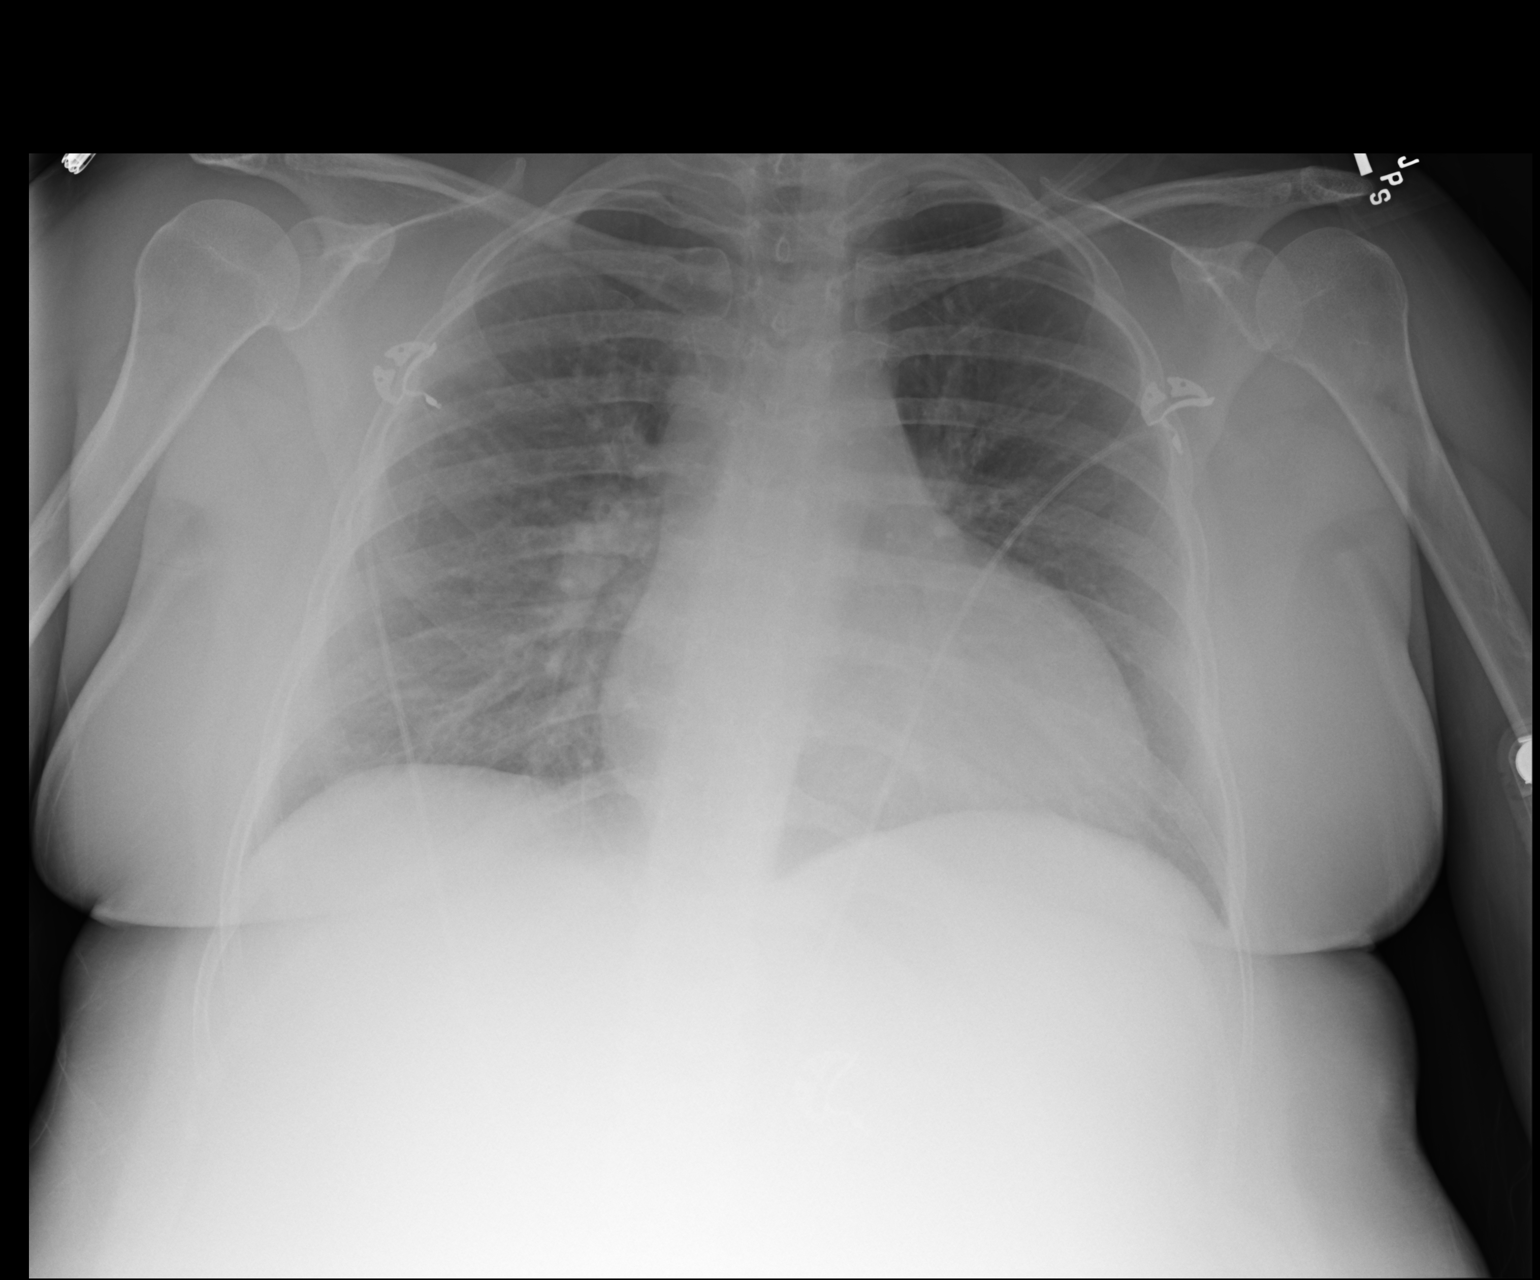

[1 of 1 positions shown; findings below may reference images not displayed]

FINDINGS: Borderline enlarged cardiac silhouette, likely accentuated by
technique. Both lungs are clear. The visualized skeletal structures
are unremarkable.
IMPRESSION: No evidence of acute cardiopulmonary disease.

## 2023-11-01 NOTE — Telephone Encounter (Signed)
 Patient called the office for nerve testing at the number you provided. She was told that she isn't in the system. Can you help coordinate this?

## 2023-11-01 NOTE — Patient Instructions (Addendum)
  VISIT SUMMARY: During your visit, we discussed your moderate to severe obstructive sleep apnea and the difficulties you are experiencing with CPAP therapy. We reviewed your sleep study results and addressed your concerns about mask discomfort and anxiety related to CPAP use.  YOUR PLAN: -OBSTRUCTIVE SLEEP APNEA: Obstructive sleep apnea is a condition where your airway becomes blocked during sleep, causing breathing interruptions. We will lower your CPAP pressure setting to 9 or 10 and increase the ramp time to help you adjust more comfortably. You will also have a mask fitting at a medical supply store to try different masks. If these adjustments do not help, we may consider desensitization in the sleep lab or a referral to an ENT specialist for an [place] device. Please call [employer] to check your eligibility for a mask trial period and try wearing the mask during the day without the CPAP to get used to it.  INSTRUCTIONS: Please follow up with the medical supply store for a mask fitting and call [employer] to check your eligibility for a mask trial period. If you continue to have difficulties, we may need to consider desensitization in the sleep lab or a referral to an ENT specialist for further evaluation.  Orders: Please call [employer] to see if you can try a different CPAP mask if within time window  Change CPAP pressure 10cm h20 Mask desensitization   Follow-up: 3 months with [facility]

## 2023-11-01 NOTE — Progress Notes (Signed)
 @Patient  ID: Jennifer [name], female    DOB: 07/22/1990, 33 y.o.   MRN: [id number]  No chief complaint on file.   Referring provider: [name]  HPI: 33 year old female, never smoked.  Past medical history significant for congestive heart failure, hypertension, pulmonary hypertension, cardial infarction, hepatic steatosis, gastroparesis, type 1 diabetes, hypothyroidism, kidney disease.  Previous [facility]  05/04/2023 Discussed the use of AI scribe software for clinical note transcription with the patient, who gave verbal consent to proceed.  History of Present Illness   Jennifer [name] is a 33 year old female who presents with difficulty sleeping. She was referred by Joesph Emilio, her primary care doctor, for evaluation of sleep disturbances.  She has been experiencing persistent difficulty sleeping, characterized by trouble both falling and staying asleep. Her typical bedtime is between 10:00 and 10:30 PM, but it takes her 30 to 40 minutes to fall asleep. She wakes up 30 to 40 times a night, often gasping for air. No snoring, but if she starts snoring, she usually wakes up. Her weight has been increasing over the past two years without a clear explanation, which she suspects might be related to fluid retention.  There is a concern for sleep apnea due to episodes of oxygen desaturation observed during hospital stays, where she wakes up gasping for air and dreams of being underwater. However, multiple home sleep studies, including the most recent one in October 2023, have returned negative results for sleep apnea. She has not undergone an in-lab sleep study.  She has a history of hypertension, with recent blood pressure readings elevated at 160/90-100 mmHg. She experiences symptoms such as hot flashes and headaches when her blood pressure is high. She monitors her blood pressure at home and is managed by her cardiologist.      11/01/2023 - Interim hx  Discussed the use of  AI scribe software for clinical note transcription with the patient, who gave verbal consent to proceed.  History of Present Illness Jennifer [name] is a 33 year old female with moderate to severe sleep apnea who presents with difficulty tolerating CPAP therapy. She was referred by Joesph Emilio for sleep disturbances.  She experiences difficulty with CPAP therapy, describing it as 'awful' and anxiety-inducing. CPAP usage was inconsistent, with six to seven days recorded in May, but she was hospitalized for most of that month. In June and July, she used the machine but often removed it during sleep due to discomfort.  A sleep study on April 4th showed 28 apneic events per hour, snoring, and oxygen desaturations to 75%, with an average oxygen level of 91%.  The nasal CPAP mask causes discomfort as she feels the air going down her throat, which 'freaks her out'. She has a fear of masks, possibly related to claustrophobia since childhood. She alternates between a nasal pillow and a full mask, finding the latter more effective despite discomfort.  She takes Lyrica  at bedtime for general neuropathy, which does not usually cause grogginess or drowsiness. No use of sedatives, pain medications, or sleep aids.  Airview download 08/02/23-10/30/23 Usage days 12/90 days (13%) Average usage days used 4 hours 1 minutes Pressure 5-15cm h20  Airleaks 84.3L/min (95%) AHI 4.3   Allergies  Allergen Reactions   Icosapent  Ethyl (Epa Ethyl Ester) (Fish) Hives   Ketamine Other (See Comments)    In a vegetative state for 15 minutes, per pt   Maitake Itching and Other (See Comments)    Itchy throat   Morphine  Hives,  Itching, Rash and Other (See Comments)    Sore throat, too   Penicillins Hives, Itching and Rash    Has patient had a PCN reaction causing immediate rash, facial/tongue/throat swelling, SOB or lightheadedness with hypotension: Y Has patient had a PCN reaction causing severe rash involving mucus  membranes or skin necrosis: Y Has patient had a PCN reaction that required hospitalization: N Has patient had a PCN reaction occurring within the last 10 years: Y   Mushroom Other (See Comments)    Pt has never had mushrooms but tested positive on allergy test   Shellfish Allergy Other (See Comments)    Pt has never had shellfish, but tested positive on allergy test. Pt states contrast in CT is okay.   Fentanyl  Nausea And Vomiting   Prednisone Rash    Immunization History  Administered Date(s) Administered   Fluzone Influenza virus vaccine,trivalent (IIV3), split virus 01/07/2019   Hepb-cpg 05/12/2021, 07/12/2021   Influenza, Mdck, Trivalent,PF 6+ MOS(egg free) 01/12/2023   Influenza,inj,Quad PF,6+ Mos 01/07/2019, 01/02/2020   Influenza-Unspecified 01/07/2019, 01/02/2020, 12/10/2020, 11/24/2021   PNEUMOCOCCAL CONJUGATE-20 05/12/2021   PPD Test 09/08/2020, 11/08/2022   Pneumococcal Conjugate-13 03/29/2020, 12/10/2020   Pneumococcal Polysaccharide-23 01/07/2019, 09/09/2022   Pneumococcal-Unspecified 09/09/2022   Tdap 01/07/2019    Past Medical History:  Diagnosis Date   Afib (HCC) 05/12/2021   Brain tumor (HCC) 03/29/1995   astrocytoma   CHF (congestive heart failure) (HCC)    Cholesterosis    CKD (chronic kidney disease) stage 4, GFR 15-29 ml/min (HCC) 05/13/2021   DM (diabetes mellitus) (HCC) 10/10/2018   Fatty liver    Gastroparesis due to DM (HCC) 05/12/2021   HTN (hypertension) 10/10/2018   Hypothyroidism 10/10/2018   Lipoprotein deficiency    Lung disease    longevity long term   Pancreatitis    Polycystic ovary syndrome     Tobacco History: Social History   Tobacco Use  Smoking Status Never  Smokeless Tobacco Never   Counseling given: Not Answered   Outpatient Medications Prior to Visit  Medication Sig Dispense Refill   amitriptyline  (ELAVIL ) 10 MG tablet Take 10 mg by mouth at bedtime.     aspirin  EC 81 MG EC tablet Take 1 tablet (81 mg total) by mouth  daily. Swallow whole. 30 tablet 1   azelastine  (ASTELIN ) 0.1 % nasal spray Place 2 sprays into both nostrils daily as needed for rhinitis or allergies.     BAQSIMI ONE PACK 3 MG/DOSE POWD Place 1 Dose into the nose once as needed (hypoglycemia).     BLISOVI  FE 1.5/30 1.5-30 MG-MCG tablet Take 1 tablet by mouth daily.     butalbital -acetaminophen -caffeine  (FIORICET ) 50-325-40 MG tablet Take 1 tablet by mouth daily as needed for headache.     calcitRIOL  (ROCALTROL ) 0.25 MCG capsule Take 0.25 mcg by mouth daily.     carvedilol  (COREG ) 6.25 MG tablet Take 1.5 tablets (9.375 mg total) by mouth 2 (two) times daily. 270 tablet 3   Continuous Glucose Sensor (DEXCOM G7 SENSOR) MISC Inject 1 Device into the skin See admin instructions. Place 1 new sensor into the skin every 10 days     CRANBERRY CONCENTRATE PO Take 1 tablet by mouth daily at 12 noon.     CREON  36000-114000 units CPEP capsule Take 1 capsule (36,000 Units total) by mouth 3 (three) times daily. 90 capsule 3   dicyclomine  (BENTYL ) 20 MG tablet Take 1 tablet (20 mg total) by mouth 2 (two) times daily. 20 tablet 0  DULoxetine  (CYMBALTA ) 60 MG capsule Take 60 mg by mouth at bedtime.     ergocalciferol  (VITAMIN D2) 1.25 MG (50000 UT) capsule Take 50,000 Units by mouth every Sunday.     fenofibrate  (TRICOR ) 145 MG tablet Take 1 tablet (145 mg total) by mouth daily. 30 tablet 11   fenofibrate  54 MG tablet Take 1 tablet (54 mg total) by mouth daily. 30 tablet 11   Glucose Blood (BLOOD GLUCOSE TEST STRIPS) STRP 1 each by Does not apply route 3 (three) times daily. Use as directed to check blood sugar. May dispense any manufacturer covered by patient's insurance and fits patient's device. 100 strip 0   HUMALOG  KWIKPEN 100 UNIT/ML KwikPen Inject 60-95 Units into the skin See admin instructions. Inject 60-95 units into the skin three times a day before meals, per sliding scale     HUMULIN  R U-500 KWIKPEN 500 UNIT/ML KwikPen Inject 195-300 Units into the  skin See admin instructions. 195 am bef breakfast300 hs     hydrALAZINE  (APRESOLINE ) 50 MG tablet Take 50 mg by mouth daily.     Insulin  Pen Needle (PEN NEEDLES) 31G X 5 MM MISC Use as directed 3 (three) times daily. 100 each 0   ivabradine  (CORLANOR ) 7.5 MG TABS tablet Take 1 tablet (7.5 mg total) by mouth 2 (two) times daily with a meal. 60 tablet 6   Lancet Device MISC 1 each by Does not apply route 3 (three) times daily. May dispense any manufacturer covered by patient's insurance. 1 each 0   Lancets MISC 1 each by Does not apply route 3 (three) times daily. Use as directed to check blood sugar. May dispense any manufacturer covered by patient's insurance and fits patient's device. 100 each 0   lansoprazole  (PREVACID ) 30 MG capsule Take 1 capsule (30 mg total) by mouth 2 (two) times daily before a meal. 60 capsule 2   levocetirizine (XYZAL ) 5 MG tablet Take 5 mg by mouth at bedtime.     levothyroxine  (SYNTHROID ) 150 MCG tablet Take 150 mcg by mouth daily before breakfast.     metoCLOPramide  (REGLAN ) 10 MG tablet Take 1 tablet (10 mg total) by mouth every 6 (six) hours. 30 tablet 0   Multiple Vitamin (MULTIVITAMIN WITH MINERALS) TABS tablet Take 1 tablet by mouth daily.     oxyCODONE  (OXY IR/ROXICODONE ) 5 MG immediate release tablet Take 1 tablet (5 mg total) by mouth every 6 (six) hours as needed for severe pain (pain score 7-10). 20 tablet 0   oxyCODONE  (ROXICODONE ) 5 MG immediate release tablet Take 1 tablet (5 mg total) by mouth every 4 (four) hours as needed for severe pain (pain score 7-10). 5 tablet 0   polyethylene glycol (MIRALAX  / GLYCOLAX ) 17 g packet Take 17 g by mouth daily as needed for mild constipation. Also available OTC 30 each 0   pregabalin  (LYRICA ) 300 MG capsule Take 300 mg by mouth at bedtime.     prochlorperazine  (COMPAZINE ) 10 MG tablet Take 10 mg by mouth at bedtime. Preventative     promethazine  (PHENERGAN ) 12.5 MG tablet Take 1 tablet (12.5 mg total) by mouth every 12  (twelve) hours as needed for nausea or vomiting. 30 tablet 0   REPATHA  SURECLICK 140 MG/ML SOAJ Inject 140 mg into the skin every 14 (fourteen) days.     rosuvastatin  (CRESTOR ) 10 MG tablet Take 1 tablet (10 mg total) by mouth daily. 90 tablet 0   Safety Seal Miscellaneous MISC Hormonic Hair Solution with minoxidil USP 7%  and finasteride USP 0.05% - apply to affected areas daily every morning. 30 mL 2   sodium bicarbonate  650 MG tablet Take 2 tablets (1,300 mg total) by mouth 2 (two) times daily. 120 tablet 3   sucralfate  (CARAFATE ) 1 GM/10ML suspension Take 10 mLs (1 g total) by mouth 4 (four) times daily -  with meals and at bedtime. 420 mL 0   topiramate  (TOPAMAX ) 50 MG tablet Take 50 mg by mouth 2 (two) times daily.     torsemide  (DEMADEX ) 20 MG tablet Take 2 tablets (40 mg total) by mouth daily.     TYLENOL  500 MG tablet Take 500-1,000 mg by mouth every 6 (six) hours as needed for mild pain (pain score 1-3) (or headaches).     No facility-administered medications prior to visit.   Review of Systems  Review of Systems  Constitutional: Negative.   HENT: Negative.    Respiratory: Negative.    Cardiovascular: Negative.   Psychiatric/Behavioral:  Positive for sleep disturbance.     Physical Exam  There were no vitals taken for this visit. Physical Exam Constitutional:      Appearance: Normal appearance. [name] not ill-appearing.  HENT:     Head: Normocephalic and atraumatic.     Mouth/Throat:     Mouth: Mucous membranes are moist.     Pharynx: Oropharynx is clear.  Cardiovascular:     Rate and Rhythm: Normal rate and regular rhythm.  Pulmonary:     Effort: Pulmonary effort is normal.     Breath sounds: Normal breath sounds.  Skin:    General: Skin is warm and dry.  Neurological:     General: No focal deficit present.     Mental Status: [name] alert and oriented to person, place, and time. Mental status is at baseline.  Psychiatric:        Mood and Affect: Mood normal.         Behavior: Behavior normal.        Thought Content: Thought content normal.        Judgment: Judgment normal.     Lab Results:  CBC    Component Value Date/Time   WBC 9.1 09/06/2023 1822   RBC 4.34 09/06/2023 1822   HGB 12.7 09/06/2023 1822   HCT 39.3 09/06/2023 1822   PLT 304 09/06/2023 1822   MCV 90.6 09/06/2023 1822   MCH 29.3 09/06/2023 1822   MCHC 32.3 09/06/2023 1822   RDW 13.3 09/06/2023 1822   LYMPHSABS 2.5 09/05/2023 1731   MONOABS 0.4 09/05/2023 1731   EOSABS 0.3 09/05/2023 1731   BASOSABS 0.1 09/05/2023 1731    BMET    Component Value Date/Time   NA 134 (L) 09/06/2023 1822   NA 132 (L) 11/19/2021 1058   K 5.1 09/06/2023 1822   CL 101 09/06/2023 1822   CO2 21 (L) 09/06/2023 1822   GLUCOSE 369 (H) 09/06/2023 1822   BUN 44 (H) 09/06/2023 1822   BUN 102 (HH) 11/19/2021 1058   CREATININE 2.40 (H) 09/06/2023 1822   CALCIUM  10.1 09/06/2023 1822   GFRNONAA 27 (L) 09/06/2023 1822   GFRAA 68 08/22/2019 0914    BNP    Component Value Date/Time   BNP 278.7 (H) 08/20/2023 0551    ProBNP No results found for: PROBNP  Imaging: No results found.   Assessment & Plan:   1. OSA (obstructive sleep apnea) (Primary) - Desensitization mask fit; Future - Ambulatory Referral for DME  Assessment and Plan Assessment & Plan  Obstructive sleep apnea, moderate to severe Moderate to severe obstructive sleep apnea with 28 apneic events per hour, snoring, and oxygen desaturations as low as 75%. Average oxygen saturation is 91%. Difficulty tolerating CPAP due to anxiety, claustrophobia, and discomfort with nasal mask pressure. CPAP is the gold standard treatment but is currently not well tolerated. Risks of untreated sleep apnea include cardiac arrhythmias, pulmonary hypertension, diabetes, Alzheimer's, and retinal issues. - Lower CPAP pressure setting to 10. - Increase CPAP ramp time to gradually reach the set pressure. - Order mask fitting at medical supply store  to try different masks. - Consider desensitization in the sleep lab if CPAP adjustments are not successful. - Discuss potential referral to ENT for Inspire device if CPAP remains intolerable. - Advise her to call [employer] to check eligibility for mask trial period. - Encourage desensitization by wearing the mask during the day without CPAP.   Almarie LELON Ferrari, NP 11/01/2023

## 2023-11-02 ENCOUNTER — Telehealth: Payer: Self-pay

## 2023-11-02 NOTE — Telephone Encounter (Signed)
 See telephone note

## 2023-11-02 NOTE — Telephone Encounter (Signed)
 referral

## 2023-11-02 NOTE — Telephone Encounter (Signed)
 Copied from CRM [id number]. Topic: Clinical - Medical Advice >> Nov 02, 2023  1:14 PM Jennifer [name] wrote: Reason for CRM: Patient was seen yesterday - she would like to know how to change the pressure on her oxygen machine.   Callback number: [phone]   Spoke w/ patient [name]     NFN-

## 2023-11-06 ENCOUNTER — Telehealth: Payer: Self-pay | Admitting: Family

## 2023-11-06 NOTE — Telephone Encounter (Signed)
  Cardiomems Remote Monitoring  S/P Cardiomems Implant 12/06/21  PAD Goal: 17 Most recent reading: 31 indicating fluid accumulation  Recommended changes: Staff to call patient to assess for symptoms, weight, diuretic usage and to encourage daily cardiomems transmissions.  I continue to review and analyze the patients PA pressures weekly (and more often as needed) to bring PA pressures within the optimal range.    [name] Class, FNP-C 11/06/23

## 2023-11-07 NOTE — Progress Notes (Signed)
 Called and spoke with pt regarding Cardiomems reading. Pt states she forgets to use it most times, but is having SOB and fluid build up in her abdomen region. Pt denies any wt gain. Pt states she is currently taking Torsemide  60 MG daily in the afternoon.  Please advise.

## 2023-11-08 ENCOUNTER — Other Ambulatory Visit: Payer: Self-pay | Admitting: Family

## 2023-11-08 ENCOUNTER — Telehealth (HOSPITAL_COMMUNITY): Payer: Self-pay | Admitting: Family Medicine

## 2023-11-08 MED ORDER — TORSEMIDE 20 MG PO TABS: 60.0000 mg | ORAL_TABLET | Freq: Every day | ORAL | Status: DC

## 2023-11-09 ENCOUNTER — Telehealth (HOSPITAL_COMMUNITY): Payer: Self-pay

## 2023-11-09 NOTE — Telephone Encounter (Deleted)
 SABRA

## 2023-11-09 NOTE — Progress Notes (Signed)
 Per [name] Class, FNP: If they can't get her in this week & she won't come here, let's do the metolazone  to get her through the weekend.  If pt no show's appt 11/10/23, pt should complete tx plan of: Metolazone  2.5mg  every other day X 2 doses. Emphasize daily cardiomems reading.

## 2023-11-09 NOTE — Telephone Encounter (Signed)
 Called to confirm/remind patient of [facility] on [facility].   Appointment:   [] Confirmed  [x] Left mess   [] No answer/No voice mail  [] VM Full/unable to leave message  [] Phone not in service  And to bring in all medications and/or complete list.

## 2023-11-10 ENCOUNTER — Telehealth (HOSPITAL_COMMUNITY): Payer: Self-pay

## 2023-11-10 ENCOUNTER — Encounter (HOSPITAL_COMMUNITY): Payer: Self-pay

## 2023-11-10 ENCOUNTER — Other Ambulatory Visit: Payer: Self-pay

## 2023-11-10 ENCOUNTER — Other Ambulatory Visit (HOSPITAL_COMMUNITY): Payer: Self-pay

## 2023-11-10 ENCOUNTER — Ambulatory Visit (HOSPITAL_COMMUNITY)
Admission: RE | Admit: 2023-11-10 | Discharge: 2023-11-10 | Disposition: A | Discharge status: Home / Self Care | Source: Ambulatory Visit | Attending: Cardiology | Admitting: Cardiology

## 2023-11-10 VITALS — BP 146/82 | HR 128 | Ht <= 58 in | Wt 145.0 lb

## 2023-11-10 DIAGNOSIS — Z794 Long term (current) use of insulin: Secondary | ICD-10-CM | POA: Diagnosis not present

## 2023-11-10 DIAGNOSIS — I5020 Unspecified systolic (congestive) heart failure: Secondary | ICD-10-CM

## 2023-11-10 DIAGNOSIS — I5022 Chronic systolic (congestive) heart failure: Secondary | ICD-10-CM

## 2023-11-10 DIAGNOSIS — I10 Essential (primary) hypertension: Secondary | ICD-10-CM

## 2023-11-10 DIAGNOSIS — Z91148 Patient's other noncompliance with medication regimen for other reason: Secondary | ICD-10-CM | POA: Diagnosis not present

## 2023-11-10 DIAGNOSIS — I2510 Atherosclerotic heart disease of native coronary artery without angina pectoris: Secondary | ICD-10-CM | POA: Diagnosis not present

## 2023-11-10 DIAGNOSIS — Z8719 Personal history of other diseases of the digestive system: Secondary | ICD-10-CM | POA: Insufficient documentation

## 2023-11-10 DIAGNOSIS — K589 Irritable bowel syndrome without diarrhea: Secondary | ICD-10-CM | POA: Diagnosis not present

## 2023-11-10 DIAGNOSIS — E781 Pure hyperglyceridemia: Secondary | ICD-10-CM | POA: Diagnosis not present

## 2023-11-10 DIAGNOSIS — R1900 Intra-abdominal and pelvic swelling, mass and lump, unspecified site: Secondary | ICD-10-CM | POA: Diagnosis not present

## 2023-11-10 DIAGNOSIS — S060XAA Concussion with loss of consciousness status unknown, initial encounter: Secondary | ICD-10-CM | POA: Diagnosis not present

## 2023-11-10 DIAGNOSIS — Z7984 Long term (current) use of oral hypoglycemic drugs: Secondary | ICD-10-CM | POA: Diagnosis not present

## 2023-11-10 DIAGNOSIS — R519 Headache, unspecified: Secondary | ICD-10-CM | POA: Insufficient documentation

## 2023-11-10 DIAGNOSIS — I272 Pulmonary hypertension, unspecified: Secondary | ICD-10-CM | POA: Insufficient documentation

## 2023-11-10 DIAGNOSIS — I130 Hypertensive heart and chronic kidney disease with heart failure and stage 1 through stage 4 chronic kidney disease, or unspecified chronic kidney disease: Secondary | ICD-10-CM | POA: Insufficient documentation

## 2023-11-10 DIAGNOSIS — I428 Other cardiomyopathies: Secondary | ICD-10-CM | POA: Diagnosis not present

## 2023-11-10 DIAGNOSIS — I3139 Other pericardial effusion (noninflammatory): Secondary | ICD-10-CM | POA: Diagnosis not present

## 2023-11-10 DIAGNOSIS — R000 Tachycardia, unspecified: Secondary | ICD-10-CM

## 2023-11-10 DIAGNOSIS — G4733 Obstructive sleep apnea (adult) (pediatric): Secondary | ICD-10-CM | POA: Insufficient documentation

## 2023-11-10 DIAGNOSIS — X58XXXA Exposure to other specified factors, initial encounter: Secondary | ICD-10-CM | POA: Insufficient documentation

## 2023-11-10 DIAGNOSIS — K760 Fatty (change of) liver, not elsewhere classified: Secondary | ICD-10-CM | POA: Insufficient documentation

## 2023-11-10 DIAGNOSIS — Q228 Other congenital malformations of tricuspid valve: Secondary | ICD-10-CM

## 2023-11-10 DIAGNOSIS — N184 Chronic kidney disease, stage 4 (severe): Secondary | ICD-10-CM | POA: Diagnosis not present

## 2023-11-10 DIAGNOSIS — E1122 Type 2 diabetes mellitus with diabetic chronic kidney disease: Secondary | ICD-10-CM | POA: Diagnosis not present

## 2023-11-10 DIAGNOSIS — Z79899 Other long term (current) drug therapy: Secondary | ICD-10-CM | POA: Insufficient documentation

## 2023-11-10 LAB — BRAIN NATRIURETIC PEPTIDE: B Natriuretic Peptide: 27.7 pg/mL (ref 0.0–100.0)

## 2023-11-10 MED ORDER — ERGOCALCIFEROL 1.25 MG (50000 UT) PO CAPS: 50000.0000 [IU] | ORAL_CAPSULE | ORAL | 0 refills | Status: AC

## 2023-11-10 MED ORDER — ISOSORBIDE MONONITRATE ER 30 MG PO TB24: 30.0000 mg | ORAL_TABLET | Freq: Every day | ORAL | 3 refills | Status: DC

## 2023-11-10 NOTE — Addendum Note (Signed)
 Encounter addended by: Alvan Fausto SAUNDERS, CMA on: 11/10/2023 3:21 PM  Actions taken: Child order released for a procedure order

## 2023-11-10 NOTE — Telephone Encounter (Signed)
 Orders Placed This Encounter  Procedures   Basic metabolic panel with GFR    Standing Status:   Future    Expiration Date:   11/09/2024    Release to patient:   Immediate    Release to patient:   Immediate [1]   B Nat Peptide    Standing Status:   Future    Expiration Date:   11/09/2024    Release to patient:   Immediate [1]
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
## 2023-11-10 NOTE — Telephone Encounter (Signed)
 Patient returned a call to the front desk per AW. Patient will have her labs redrawn today at [facility].  In addition, patient labs orders has been placed.

## 2023-11-10 NOTE — Addendum Note (Signed)
 Encounter addended by: Alvan Fausto SAUNDERS, CMA on: 11/10/2023 4:12 PM  Actions taken: Child order released for a procedure order

## 2023-11-10 NOTE — Telephone Encounter (Signed)
 Attempted to call patient to schedule a repeat BNP and B-met lab appointment. Patient's labs from today hemolyzed per HS.  Voice message left to return a call to our office to get scheduled.

## 2023-11-10 NOTE — Patient Instructions (Addendum)
 Thank you for coming in today  If you had labs drawn today, any labs that are abnormal the clinic will call you No news is good news  Medications: Start Imdur  30 mg daily  Follow up appointments:  Your physician recommends that you schedule a follow-up appointment in:  3 months With Dr. [name] Please call our office to schedule the follow-up appointment in September for November 2025.   Do the following things EVERYDAY: Weigh yourself in the morning before breakfast. Write it down and keep it in a log. Take your medicines as prescribed Eat low salt foods--Limit salt (sodium) to 2000 mg per day.  Stay as active as you can everyday Limit all fluids for the day to less than 2 liters   At the [facility], you and your health needs are our priority. As part of our continuing mission to provide you with exceptional heart care, we have created designated Provider Care Teams. These Care Teams include your primary Cardiologist (physician) and Advanced Practice Providers (APPs- Physician Assistants and Nurse Practitioners) who all work together to provide you with the care you need, when you need it.   You may see any of the following providers on your designated Care Team at your next follow up: Dr Toribio [name] Dr Ezra Shuck Dr. Ria Gardenia Greig Lenetta, NP Caffie Shed, [place] [facility] [place], [place] Beckey Coe, NP Tinnie Redman, PharmD   Please be sure to bring in all your medications bottles to every appointment.    Thank you for choosing [facility]  If you have any questions or concerns before your next appointment please send us  a message through [place] or call our office at [phone].    TO LEAVE A MESSAGE FOR THE NURSE SELECT OPTION 2, PLEASE LEAVE A MESSAGE INCLUDING: YOUR NAME DATE OF BIRTH CALL BACK NUMBER REASON FOR CALL**this is important as we prioritize the call backs  YOU WILL  RECEIVE A CALL BACK THE SAME DAY AS LONG AS YOU CALL BEFORE 4:00 PM

## 2023-11-10 NOTE — Progress Notes (Signed)
 ReDS Vest / Clip - 11/10/23 1214       ReDS Vest / Clip   Station Marker B    Ruler Value 33.5    ReDS Value Range Low volume    ReDS Actual Value 26

## 2023-11-13 ENCOUNTER — Ambulatory Visit (HOSPITAL_COMMUNITY): Payer: Self-pay | Admitting: Cardiology

## 2023-11-13 ENCOUNTER — Other Ambulatory Visit (HOSPITAL_BASED_OUTPATIENT_CLINIC_OR_DEPARTMENT_OTHER)

## 2023-11-13 LAB — BASIC METABOLIC PANEL WITH GFR
BUN/Creatinine Ratio: 37 — ABNORMAL HIGH (ref 9–23)
BUN: 70 mg/dL — ABNORMAL HIGH (ref 6–20)
CO2: 15 mmol/L — ABNORMAL LOW (ref 20–29)
Calcium: 9.5 mg/dL (ref 8.7–10.2)
Chloride: 93 mmol/L — ABNORMAL LOW (ref 96–106)
Creatinine, Ser: 1.9 mg/dL — ABNORMAL HIGH (ref 0.57–1.00)
Glucose: 68 mg/dL — ABNORMAL LOW (ref 70–99)
Potassium: 4.4 mmol/L (ref 3.5–5.2)
Sodium: 131 mmol/L — ABNORMAL LOW (ref 134–144)
eGFR: 36 mL/min/1.73 — ABNORMAL LOW (ref >59)

## 2023-11-13 LAB — BRAIN NATRIURETIC PEPTIDE: BNP: 20.9 pg/mL (ref 0.0–100.0)

## 2023-11-16 ENCOUNTER — Telehealth: Payer: Self-pay

## 2023-11-16 NOTE — Telephone Encounter (Signed)
-----   Message from Harlene RAMAN sent at 11/15/2023  6:02 PM EDT ----- Regarding: Outgoing Referral Good evening,  We got a fax from [facility] that they have closed the referral on their end on this patient. The reason is the patient has cancelled or no showed without rescheduling. The note has their phone number to call if the referral needs to be reopened. I have indexed this into the patients chart and indexed under media.  Thanks,  Harlene RAMAN.

## 2023-11-21 NOTE — Telephone Encounter (Signed)
 error

## 2023-11-30 ENCOUNTER — Telehealth: Payer: Self-pay | Admitting: Family

## 2023-11-30 NOTE — Telephone Encounter (Signed)
  Cardiomems Remote Monitoring  S/P Cardiomems Implant 12/06/21  PAD Goal: 24 Most recent reading: 30 indicating fluid accumulation  Recommended changes: Will have staff call with med changes. Give metolazone  2.5mg  daily X 2 doses. Do daily cardiomems transmissions. BMET next week in [place]  I continue to review and analyze the patients PA pressures weekly (and more often as needed) to bring PA pressures within the optimal range.     [name] Class, FNP-C 11/30/23

## 2023-12-01 ENCOUNTER — Telehealth: Payer: Self-pay | Admitting: Cardiology

## 2023-12-01 NOTE — Telephone Encounter (Signed)
 FYI Pt called and cx her appt for 12/07/23 with Dr Ladona stating she is seeing another heart doctor.

## 2023-12-01 NOTE — Telephone Encounter (Signed)
 I think she wants to follow up with Olivia Pavy, PA-C who she saw last and did a fabulous job. I am fine what patient decides

## 2023-12-01 NOTE — Progress Notes (Signed)
 Lvm to return call regarding cardiomems.

## 2023-12-04 ENCOUNTER — Encounter: Payer: Self-pay | Admitting: *Deleted

## 2023-12-05 NOTE — Telephone Encounter (Signed)
 Received incoming call from patient. Patient states she does not wish to schedule appointments with Dr. Ladona or Olivia Pavy to follow up. Patient states she currently sees Dr. [name] and she wishes to only have one doctor manage her medication. Patient then states she has been having paresthesia in her right foot for two days. States her abdomen has been swollen the last two days states she looks pregnant and her right foot is swollen. Patient denies shortness of breath. Advised patient this note would be routed to Dr. [name] because she wishes to only follow up with him. Advised patient to call Dr. Nelle office as well to notify of symptoms. Patient given ED precautions. Patient verbalized understanding and agreeable to plan.

## 2023-12-05 NOTE — Telephone Encounter (Signed)
 Attempted to contact patient. Left message to call back on personal voicemail.

## 2023-12-06 ENCOUNTER — Telehealth: Payer: Self-pay | Admitting: Family

## 2023-12-06 ENCOUNTER — Other Ambulatory Visit: Payer: Self-pay

## 2023-12-06 ENCOUNTER — Telehealth (HOSPITAL_COMMUNITY): Payer: Self-pay | Admitting: Cardiology

## 2023-12-06 DIAGNOSIS — Z91013 Allergy to seafood: Secondary | ICD-10-CM

## 2023-12-06 DIAGNOSIS — I272 Pulmonary hypertension, unspecified: Secondary | ICD-10-CM | POA: Diagnosis present

## 2023-12-06 DIAGNOSIS — E282 Polycystic ovarian syndrome: Secondary | ICD-10-CM | POA: Diagnosis present

## 2023-12-06 DIAGNOSIS — K760 Fatty (change of) liver, not elsewhere classified: Secondary | ICD-10-CM | POA: Diagnosis present

## 2023-12-06 DIAGNOSIS — Z91018 Allergy to other foods: Secondary | ICD-10-CM

## 2023-12-06 DIAGNOSIS — I5082 Biventricular heart failure: Secondary | ICD-10-CM | POA: Diagnosis present

## 2023-12-06 DIAGNOSIS — I160 Hypertensive urgency: Secondary | ICD-10-CM | POA: Diagnosis present

## 2023-12-06 DIAGNOSIS — E66811 Obesity, class 1: Secondary | ICD-10-CM | POA: Diagnosis present

## 2023-12-06 DIAGNOSIS — E1143 Type 2 diabetes mellitus with diabetic autonomic (poly)neuropathy: Secondary | ICD-10-CM | POA: Diagnosis present

## 2023-12-06 DIAGNOSIS — D696 Thrombocytopenia, unspecified: Secondary | ICD-10-CM | POA: Diagnosis present

## 2023-12-06 DIAGNOSIS — Z880 Allergy status to penicillin: Secondary | ICD-10-CM

## 2023-12-06 DIAGNOSIS — F32A Depression, unspecified: Secondary | ICD-10-CM | POA: Diagnosis present

## 2023-12-06 DIAGNOSIS — E039 Hypothyroidism, unspecified: Secondary | ICD-10-CM | POA: Diagnosis present

## 2023-12-06 DIAGNOSIS — N179 Acute kidney failure, unspecified: Secondary | ICD-10-CM | POA: Diagnosis present

## 2023-12-06 DIAGNOSIS — Z888 Allergy status to other drugs, medicaments and biological substances status: Secondary | ICD-10-CM

## 2023-12-06 DIAGNOSIS — G4733 Obstructive sleep apnea (adult) (pediatric): Secondary | ICD-10-CM | POA: Diagnosis present

## 2023-12-06 DIAGNOSIS — Z85841 Personal history of malignant neoplasm of brain: Secondary | ICD-10-CM

## 2023-12-06 DIAGNOSIS — Z79899 Other long term (current) drug therapy: Secondary | ICD-10-CM

## 2023-12-06 DIAGNOSIS — E875 Hyperkalemia: Secondary | ICD-10-CM | POA: Diagnosis present

## 2023-12-06 DIAGNOSIS — I5033 Acute on chronic diastolic (congestive) heart failure: Secondary | ICD-10-CM | POA: Diagnosis present

## 2023-12-06 DIAGNOSIS — I130 Hypertensive heart and chronic kidney disease with heart failure and stage 1 through stage 4 chronic kidney disease, or unspecified chronic kidney disease: Secondary | ICD-10-CM | POA: Diagnosis not present

## 2023-12-06 DIAGNOSIS — E1122 Type 2 diabetes mellitus with diabetic chronic kidney disease: Secondary | ICD-10-CM | POA: Diagnosis present

## 2023-12-06 DIAGNOSIS — K861 Other chronic pancreatitis: Secondary | ICD-10-CM | POA: Diagnosis present

## 2023-12-06 DIAGNOSIS — Z8249 Family history of ischemic heart disease and other diseases of the circulatory system: Secondary | ICD-10-CM

## 2023-12-06 DIAGNOSIS — E871 Hypo-osmolality and hyponatremia: Secondary | ICD-10-CM | POA: Diagnosis present

## 2023-12-06 DIAGNOSIS — Z79890 Hormone replacement therapy (postmenopausal): Secondary | ICD-10-CM

## 2023-12-06 DIAGNOSIS — E1169 Type 2 diabetes mellitus with other specified complication: Secondary | ICD-10-CM | POA: Diagnosis present

## 2023-12-06 DIAGNOSIS — Z885 Allergy status to narcotic agent status: Secondary | ICD-10-CM

## 2023-12-06 DIAGNOSIS — Z6831 Body mass index (BMI) 31.0-31.9, adult: Secondary | ICD-10-CM

## 2023-12-06 DIAGNOSIS — I509 Heart failure, unspecified: Secondary | ICD-10-CM | POA: Diagnosis not present

## 2023-12-06 DIAGNOSIS — N1832 Chronic kidney disease, stage 3b: Secondary | ICD-10-CM | POA: Diagnosis present

## 2023-12-06 DIAGNOSIS — I2510 Atherosclerotic heart disease of native coronary artery without angina pectoris: Secondary | ICD-10-CM | POA: Diagnosis present

## 2023-12-06 DIAGNOSIS — Z833 Family history of diabetes mellitus: Secondary | ICD-10-CM

## 2023-12-06 DIAGNOSIS — E781 Pure hyperglyceridemia: Secondary | ICD-10-CM | POA: Diagnosis present

## 2023-12-06 DIAGNOSIS — Z83438 Family history of other disorder of lipoprotein metabolism and other lipidemia: Secondary | ICD-10-CM

## 2023-12-06 DIAGNOSIS — E1165 Type 2 diabetes mellitus with hyperglycemia: Secondary | ICD-10-CM | POA: Diagnosis present

## 2023-12-06 DIAGNOSIS — Z794 Long term (current) use of insulin: Secondary | ICD-10-CM

## 2023-12-06 DIAGNOSIS — I959 Hypotension, unspecified: Secondary | ICD-10-CM | POA: Diagnosis present

## 2023-12-06 DIAGNOSIS — Z8349 Family history of other endocrine, nutritional and metabolic diseases: Secondary | ICD-10-CM

## 2023-12-06 DIAGNOSIS — Z800 Family history of malignant neoplasm of digestive organs: Secondary | ICD-10-CM

## 2023-12-06 DIAGNOSIS — Z7982 Long term (current) use of aspirin: Secondary | ICD-10-CM

## 2023-12-06 MED ORDER — METOLAZONE 2.5 MG PO TABS: 2.5000 mg | ORAL_TABLET | Freq: Every day | ORAL | 0 refills | Status: DC | PRN

## 2023-12-06 NOTE — Telephone Encounter (Addendum)
  Cardiomems Remote Monitoring  S/P Cardiomems Implant 12/06/21  PAD Goal: 24 Most recent reading: 29  Recommended changes: Will have staff call with changes  Metolazone  2.5mg  daily X 2 doses. Monitor sodium/ fluid intake carefully. Daily transmissions. Has upcoming echo and HF clinic appt on 12/11/23. Will repeat BMET at that visit.   I continue to review and analyze the patients PA pressures weekly (and more often as needed) to bring PA pressures within the optimal range.     [name] Class, [place] 12/06/23

## 2023-12-06 NOTE — Telephone Encounter (Signed)
 Pt aware 12/06/23, see phone note dated 9/5

## 2023-12-06 NOTE — Telephone Encounter (Signed)
 Spoke w/pt she states she is retaining fluid again, last Cardiomems reading 9/9 was 29 and goal is 24  Per chart, cardiomems was reviewed Fri 9/5 and [name] Class, NP advised pt to take Metolazone  2.5 mg for 2 days, however per chart staff never reached pt.  Advised pt to take Met 2.5 x2 days, if symptoms not improving call us  back on Fri, pt is already sch for echo and appt with Dr [name] Kitchens 9/15, advised will repeat labs at that time

## 2023-12-06 NOTE — Telephone Encounter (Signed)
 Patient left VM on triage line with a request to have provider review Cardio MEMS number Reports swelling in belly and hands, increase in SOB  Transmission 9/9 @ 501pm 29 GOAL 24    Please advise

## 2023-12-06 NOTE — Telephone Encounter (Signed)
 Opened in error

## 2023-12-06 NOTE — Telephone Encounter (Deleted)
 SABRA

## 2023-12-07 ENCOUNTER — Emergency Department (HOSPITAL_BASED_OUTPATIENT_CLINIC_OR_DEPARTMENT_OTHER)

## 2023-12-07 ENCOUNTER — Ambulatory Visit: Admitting: Cardiology

## 2023-12-07 ENCOUNTER — Other Ambulatory Visit: Payer: Self-pay

## 2023-12-07 ENCOUNTER — Inpatient Hospital Stay (HOSPITAL_BASED_OUTPATIENT_CLINIC_OR_DEPARTMENT_OTHER)
Admission: EM | Admit: 2023-12-07 | Discharge: 2023-12-14 | DRG: 286 | Disposition: A | Discharge status: Home / Self Care | Attending: Internal Medicine | Admitting: Internal Medicine

## 2023-12-07 ENCOUNTER — Encounter (HOSPITAL_BASED_OUTPATIENT_CLINIC_OR_DEPARTMENT_OTHER): Payer: Self-pay

## 2023-12-07 DIAGNOSIS — Z79890 Hormone replacement therapy (postmenopausal): Secondary | ICD-10-CM | POA: Diagnosis not present

## 2023-12-07 DIAGNOSIS — E039 Hypothyroidism, unspecified: Secondary | ICD-10-CM | POA: Diagnosis present

## 2023-12-07 DIAGNOSIS — E871 Hypo-osmolality and hyponatremia: Secondary | ICD-10-CM | POA: Diagnosis present

## 2023-12-07 DIAGNOSIS — E1169 Type 2 diabetes mellitus with other specified complication: Secondary | ICD-10-CM | POA: Diagnosis present

## 2023-12-07 DIAGNOSIS — I5023 Acute on chronic systolic (congestive) heart failure: Secondary | ICD-10-CM | POA: Diagnosis not present

## 2023-12-07 DIAGNOSIS — Z794 Long term (current) use of insulin: Secondary | ICD-10-CM | POA: Diagnosis not present

## 2023-12-07 DIAGNOSIS — I5043 Acute on chronic combined systolic (congestive) and diastolic (congestive) heart failure: Secondary | ICD-10-CM | POA: Diagnosis present

## 2023-12-07 DIAGNOSIS — I2510 Atherosclerotic heart disease of native coronary artery without angina pectoris: Secondary | ICD-10-CM | POA: Diagnosis present

## 2023-12-07 DIAGNOSIS — I509 Heart failure, unspecified: Principal | ICD-10-CM | POA: Insufficient documentation

## 2023-12-07 DIAGNOSIS — E1165 Type 2 diabetes mellitus with hyperglycemia: Secondary | ICD-10-CM | POA: Diagnosis present

## 2023-12-07 DIAGNOSIS — Z79899 Other long term (current) drug therapy: Secondary | ICD-10-CM | POA: Diagnosis not present

## 2023-12-07 DIAGNOSIS — I272 Pulmonary hypertension, unspecified: Secondary | ICD-10-CM | POA: Diagnosis present

## 2023-12-07 DIAGNOSIS — E1122 Type 2 diabetes mellitus with diabetic chronic kidney disease: Secondary | ICD-10-CM | POA: Diagnosis present

## 2023-12-07 DIAGNOSIS — I10 Essential (primary) hypertension: Secondary | ICD-10-CM | POA: Diagnosis not present

## 2023-12-07 DIAGNOSIS — E785 Hyperlipidemia, unspecified: Secondary | ICD-10-CM | POA: Diagnosis not present

## 2023-12-07 DIAGNOSIS — K861 Other chronic pancreatitis: Secondary | ICD-10-CM | POA: Diagnosis present

## 2023-12-07 DIAGNOSIS — Z85841 Personal history of malignant neoplasm of brain: Secondary | ICD-10-CM | POA: Diagnosis not present

## 2023-12-07 DIAGNOSIS — N179 Acute kidney failure, unspecified: Secondary | ICD-10-CM | POA: Diagnosis present

## 2023-12-07 DIAGNOSIS — K760 Fatty (change of) liver, not elsewhere classified: Secondary | ICD-10-CM | POA: Diagnosis present

## 2023-12-07 DIAGNOSIS — D696 Thrombocytopenia, unspecified: Secondary | ICD-10-CM | POA: Diagnosis present

## 2023-12-07 DIAGNOSIS — N1832 Chronic kidney disease, stage 3b: Secondary | ICD-10-CM | POA: Diagnosis present

## 2023-12-07 DIAGNOSIS — N184 Chronic kidney disease, stage 4 (severe): Secondary | ICD-10-CM | POA: Diagnosis not present

## 2023-12-07 DIAGNOSIS — F32A Depression, unspecified: Secondary | ICD-10-CM | POA: Diagnosis present

## 2023-12-07 DIAGNOSIS — I130 Hypertensive heart and chronic kidney disease with heart failure and stage 1 through stage 4 chronic kidney disease, or unspecified chronic kidney disease: Secondary | ICD-10-CM | POA: Diagnosis present

## 2023-12-07 DIAGNOSIS — I5033 Acute on chronic diastolic (congestive) heart failure: Secondary | ICD-10-CM | POA: Insufficient documentation

## 2023-12-07 DIAGNOSIS — E1143 Type 2 diabetes mellitus with diabetic autonomic (poly)neuropathy: Secondary | ICD-10-CM | POA: Diagnosis present

## 2023-12-07 DIAGNOSIS — I160 Hypertensive urgency: Secondary | ICD-10-CM | POA: Diagnosis present

## 2023-12-07 DIAGNOSIS — E781 Pure hyperglyceridemia: Secondary | ICD-10-CM

## 2023-12-07 DIAGNOSIS — E875 Hyperkalemia: Secondary | ICD-10-CM | POA: Diagnosis present

## 2023-12-07 DIAGNOSIS — Z6831 Body mass index (BMI) 31.0-31.9, adult: Secondary | ICD-10-CM | POA: Diagnosis not present

## 2023-12-07 DIAGNOSIS — I5082 Biventricular heart failure: Secondary | ICD-10-CM | POA: Diagnosis present

## 2023-12-07 LAB — GLUCOSE, CAPILLARY
Glucose-Capillary: 325 mg/dL — ABNORMAL HIGH (ref 70–99)
Glucose-Capillary: 473 mg/dL — ABNORMAL HIGH (ref 70–99)
Glucose-Capillary: 354 mg/dL — ABNORMAL HIGH (ref 70–99)

## 2023-12-07 LAB — CBC
HCT: 31.5 % — ABNORMAL LOW (ref 36.0–46.0)
Hemoglobin: 10.8 g/dL — ABNORMAL LOW (ref 12.0–15.0)
MCH: 30.6 pg (ref 26.0–34.0)
MCHC: 34.3 g/dL (ref 30.0–36.0)
MCV: 89.2 fL (ref 80.0–100.0)
Platelets: 113 K/uL — ABNORMAL LOW (ref 150–400)
RBC: 3.53 MIL/uL — ABNORMAL LOW (ref 3.87–5.11)
RDW: 15.9 % — ABNORMAL HIGH (ref 11.5–15.5)
WBC: 5.9 K/uL (ref 4.0–10.5)
nRBC: 0 % (ref 0.0–0.2)

## 2023-12-07 LAB — COMPREHENSIVE METABOLIC PANEL WITH GFR
Albumin: 4.1 g/dL (ref 3.5–5.0)
Alkaline Phosphatase: 178 U/L — ABNORMAL HIGH (ref 38–126)
Anion gap: 18 — ABNORMAL HIGH (ref 5–15)
BUN: 42 mg/dL — ABNORMAL HIGH (ref 6–20)
CO2: 21 mmol/L — ABNORMAL LOW (ref 22–32)
Calcium: 9.3 mg/dL (ref 8.9–10.3)
Chloride: 88 mmol/L — ABNORMAL LOW (ref 98–111)
Creatinine, Ser: 1.63 mg/dL — ABNORMAL HIGH (ref 0.44–1.00)
GFR, Estimated: 43 mL/min — ABNORMAL LOW (ref >60)
Glucose, Bld: 218 mg/dL — ABNORMAL HIGH (ref 70–99)
Potassium: 4.2 mmol/L (ref 3.5–5.1)
Sodium: 127 mmol/L — ABNORMAL LOW (ref 135–145)
Total Bilirubin: 0.5 mg/dL (ref 0.0–1.2)
Total Protein: 7.4 g/dL (ref 6.5–8.1)

## 2023-12-07 LAB — URINALYSIS, ROUTINE W REFLEX MICROSCOPIC
Bilirubin Urine: NEGATIVE
Ketones, ur: NEGATIVE mg/dL
Leukocytes,Ua: NEGATIVE
Nitrite: NEGATIVE
Specific Gravity, Urine: 1.008 (ref 1.005–1.030)
pH: 5.5 (ref 5.0–8.0)

## 2023-12-07 LAB — URINALYSIS, MICROSCOPIC (REFLEX): Bacteria, UA: NONE SEEN

## 2023-12-07 LAB — PREGNANCY, URINE: Preg Test, Ur: NEGATIVE

## 2023-12-07 LAB — TROPONIN T, HIGH SENSITIVITY
Troponin T High Sensitivity: 77 ng/L — ABNORMAL HIGH (ref 0–19)
Troponin T High Sensitivity: 75 ng/L — ABNORMAL HIGH (ref 0–19)

## 2023-12-07 LAB — CBG MONITORING, ED
Glucose-Capillary: 213 mg/dL — ABNORMAL HIGH (ref 70–99)
Glucose-Capillary: 262 mg/dL — ABNORMAL HIGH (ref 70–99)
Glucose-Capillary: 269 mg/dL — ABNORMAL HIGH (ref 70–99)

## 2023-12-07 LAB — LIPASE, BLOOD: Lipase: 39 U/L (ref 11–51)

## 2023-12-07 LAB — PRO BRAIN NATRIURETIC PEPTIDE: Pro Brain Natriuretic Peptide: 433 pg/mL — ABNORMAL HIGH (ref <300.0)

## 2023-12-07 MED ORDER — INSULIN ASPART 100 UNIT/ML IJ SOLN
0.0000 [IU] | Freq: Four times a day (QID) | INTRAMUSCULAR | Status: DC
  Administered 2023-12-08: 10 [IU] via SUBCUTANEOUS
  Administered 2023-12-08: 11 [IU] via SUBCUTANEOUS

## 2023-12-07 MED ORDER — ROSUVASTATIN CALCIUM 5 MG PO TABS
10.0000 mg | ORAL_TABLET | Freq: Every day | ORAL | Status: DC
Start: 2023-12-08 — End: 2023-12-13
  Administered 2023-12-08 – 2023-12-13 (×6): 10 mg via ORAL
  Filled 2023-12-07 (×6): qty 2

## 2023-12-07 MED ORDER — METOLAZONE 2.5 MG PO TABS
2.5000 mg | ORAL_TABLET | Freq: Once | ORAL | Status: AC
  Administered 2023-12-07: 2.5 mg via ORAL
  Filled 2023-12-07: qty 1

## 2023-12-07 MED ORDER — LABETALOL HCL 5 MG/ML IV SOLN
10.0000 mg | INTRAVENOUS | Status: DC | PRN
  Administered 2023-12-07: 10 mg via INTRAVENOUS
  Filled 2023-12-07: qty 4

## 2023-12-07 MED ORDER — INSULIN GLARGINE 100 UNIT/ML ~~LOC~~ SOLN
30.0000 [IU] | Freq: Every day | SUBCUTANEOUS | Status: DC
  Administered 2023-12-07: 30 [IU] via SUBCUTANEOUS
  Filled 2023-12-07 (×2): qty 0.3

## 2023-12-07 MED ORDER — INSULIN ASPART 100 UNIT/ML IJ SOLN: 0.0000 [IU] | Freq: Every day | INTRAMUSCULAR | Status: DC

## 2023-12-07 MED ORDER — INSULIN ASPART 100 UNIT/ML IJ SOLN
0.0000 [IU] | Freq: Three times a day (TID) | INTRAMUSCULAR | Status: DC
  Administered 2023-12-07: 11 [IU] via SUBCUTANEOUS

## 2023-12-07 MED ORDER — HYDRALAZINE HCL 50 MG PO TABS
50.0000 mg | ORAL_TABLET | Freq: Three times a day (TID) | ORAL | Status: DC
Start: 2023-12-07 — End: 2023-12-09
  Administered 2023-12-07 – 2023-12-09 (×6): 50 mg via ORAL
  Filled 2023-12-07: qty 2
  Filled 2023-12-07 (×6): qty 1

## 2023-12-07 MED ORDER — SODIUM BICARBONATE 650 MG PO TABS: 650.0000 mg | ORAL_TABLET | Freq: Two times a day (BID) | ORAL | Status: DC

## 2023-12-07 MED ORDER — INSULIN GLARGINE 100 UNIT/ML ~~LOC~~ SOLN
30.0000 [IU] | Freq: Every day | SUBCUTANEOUS | Status: DC
  Filled 2023-12-07: qty 0.3

## 2023-12-07 MED ORDER — INSULIN ASPART 100 UNIT/ML IJ SOLN
8.0000 [IU] | Freq: Once | INTRAMUSCULAR | Status: DC
Start: 2023-12-08 — End: 2023-12-07

## 2023-12-07 MED ORDER — DULOXETINE HCL 60 MG PO CPEP
60.0000 mg | ORAL_CAPSULE | Freq: Every day | ORAL | Status: DC
  Administered 2023-12-08 – 2023-12-13 (×7): 60 mg via ORAL
  Filled 2023-12-07 (×7): qty 1

## 2023-12-07 MED ORDER — ASPIRIN 81 MG PO TBEC
81.0000 mg | DELAYED_RELEASE_TABLET | Freq: Every day | ORAL | Status: DC
Start: 2023-12-08 — End: 2023-12-14
  Administered 2023-12-08 – 2023-12-14 (×6): 81 mg via ORAL
  Filled 2023-12-07 (×7): qty 1

## 2023-12-07 MED ORDER — DICYCLOMINE HCL 20 MG PO TABS
20.0000 mg | ORAL_TABLET | Freq: Two times a day (BID) | ORAL | Status: DC
  Administered 2023-12-08 – 2023-12-14 (×14): 20 mg via ORAL
  Filled 2023-12-07 (×15): qty 1

## 2023-12-07 MED ORDER — INSULIN ASPART 100 UNIT/ML IJ SOLN
10.0000 [IU] | INTRAMUSCULAR | Status: AC
  Administered 2023-12-07: 10 [IU] via SUBCUTANEOUS

## 2023-12-07 MED ORDER — ACETAMINOPHEN 325 MG PO TABS
650.0000 mg | ORAL_TABLET | Freq: Four times a day (QID) | ORAL | Status: DC | PRN
  Administered 2023-12-07: 650 mg via ORAL
  Filled 2023-12-07: qty 2

## 2023-12-07 MED ORDER — CARVEDILOL 6.25 MG PO TABS
9.3750 mg | ORAL_TABLET | Freq: Two times a day (BID) | ORAL | Status: DC
Start: 2023-12-07 — End: 2023-12-07

## 2023-12-07 MED ORDER — INSULIN ASPART 100 UNIT/ML IJ SOLN: 0.0000 [IU] | Freq: Three times a day (TID) | INTRAMUSCULAR | Status: DC

## 2023-12-07 MED ORDER — FUROSEMIDE 10 MG/ML IJ SOLN
80.0000 mg | Freq: Two times a day (BID) | INTRAMUSCULAR | Status: DC
  Administered 2023-12-07 – 2023-12-08 (×2): 80 mg via INTRAVENOUS
  Filled 2023-12-07 (×2): qty 8

## 2023-12-07 MED ORDER — FENOFIBRATE 54 MG PO TABS
54.0000 mg | ORAL_TABLET | Freq: Every day | ORAL | Status: DC
Start: 2023-12-08 — End: 2023-12-14
  Administered 2023-12-08 – 2023-12-14 (×7): 54 mg via ORAL
  Filled 2023-12-07 (×8): qty 1

## 2023-12-07 MED ORDER — AMITRIPTYLINE HCL 10 MG PO TABS: 10.0000 mg | ORAL_TABLET | Freq: Every day | ORAL | Status: DC

## 2023-12-07 MED ORDER — IVABRADINE HCL 5 MG PO TABS
7.5000 mg | ORAL_TABLET | Freq: Two times a day (BID) | ORAL | Status: DC
  Administered 2023-12-08 – 2023-12-14 (×13): 7.5 mg via ORAL
  Filled 2023-12-07 (×14): qty 1

## 2023-12-07 MED ORDER — LEVOTHYROXINE SODIUM 150 MCG PO TABS
150.0000 ug | ORAL_TABLET | Freq: Every day | ORAL | Status: DC
  Administered 2023-12-08 – 2023-12-14 (×7): 150 ug via ORAL
  Filled 2023-12-07: qty 1
  Filled 2023-12-07 (×3): qty 2
  Filled 2023-12-07 (×3): qty 1
  Filled 2023-12-07 (×2): qty 2
  Filled 2023-12-07: qty 1
  Filled 2023-12-07: qty 2
  Filled 2023-12-07 (×2): qty 1
  Filled 2023-12-07: qty 2

## 2023-12-07 MED ORDER — HYDRALAZINE HCL 25 MG PO TABS: 50.0000 mg | ORAL_TABLET | Freq: Every day | ORAL | Status: DC

## 2023-12-07 MED ORDER — INSULIN ASPART 100 UNIT/ML IJ SOLN
0.0000 [IU] | Freq: Three times a day (TID) | INTRAMUSCULAR | Status: DC
  Administered 2023-12-07 (×2): 8 [IU] via SUBCUTANEOUS

## 2023-12-07 MED ORDER — ISOSORBIDE MONONITRATE ER 30 MG PO TB24
30.0000 mg | ORAL_TABLET | Freq: Every day | ORAL | Status: DC
Start: 2023-12-08 — End: 2023-12-12
  Administered 2023-12-08 – 2023-12-12 (×5): 30 mg via ORAL
  Filled 2023-12-07 (×5): qty 1

## 2023-12-07 MED ORDER — FUROSEMIDE 10 MG/ML IJ SOLN
80.0000 mg | Freq: Once | INTRAMUSCULAR | Status: AC
  Administered 2023-12-07: 80 mg via INTRAVENOUS
  Filled 2023-12-07: qty 8

## 2023-12-07 MED ORDER — PREGABALIN 100 MG PO CAPS
300.0000 mg | ORAL_CAPSULE | Freq: Every day | ORAL | Status: DC
  Administered 2023-12-07 – 2023-12-13 (×7): 300 mg via ORAL
  Filled 2023-12-07 (×7): qty 3

## 2023-12-07 MED ORDER — CARVEDILOL 6.25 MG PO TABS
9.3750 mg | ORAL_TABLET | Freq: Two times a day (BID) | ORAL | Status: DC
Start: 2023-12-07 — End: 2023-12-12
  Administered 2023-12-07 – 2023-12-12 (×11): 9.375 mg via ORAL
  Filled 2023-12-07 (×7): qty 1
  Filled 2023-12-07: qty 2
  Filled 2023-12-07 (×3): qty 1

## 2023-12-07 MED ORDER — ENOXAPARIN SODIUM 30 MG/0.3ML IJ SOSY
30.0000 mg | PREFILLED_SYRINGE | INTRAMUSCULAR | Status: DC
  Administered 2023-12-07 – 2023-12-13 (×7): 30 mg via SUBCUTANEOUS
  Filled 2023-12-07 (×7): qty 0.3

## 2023-12-07 NOTE — ED Notes (Signed)
 Hydralazine  ordered however BP 120/68. Spoke with patient and with shared decision making we decided to hold medication for now. Will continue to monitor BP.

## 2023-12-07 NOTE — ED Provider Notes (Signed)
 [place] EMERGENCY DEPARTMENT AT [facility] Provider Note   CSN: [id number] Arrival date & time: 12/06/23  2346     Patient presents with: Abdominal Pain   Jennifer [name] is a 33 y.o. female.   Patient presents to the emergency department with complaints of abdominal distention and feeling like she is volume overloaded.  Patient with multiple comorbidities, suffers from congestive heart failure.  She feels like she is retaining volume because her abdomen is getting distended and she has not been urinating like normal.  Her doctor had her take Zaroxolyn  but she is still not putting out any urine.  Patient reports that when she gets like this, she requires hospitalization for multiple IV doses of Lasix .  [name] not having shortness of breath specifically but feels like her abdomen is pushing up on her lungs.  No chest pain.  Patient reports that her heart rate and blood pressure are always chronically elevated.       Prior to Admission medications   Medication Sig Start Date End Date Taking? Authorizing Provider  amitriptyline  (ELAVIL ) 10 MG tablet Take 10 mg by mouth at bedtime.    [provider]  aspirin  EC 81 MG EC tablet Take 1 tablet (81 mg total) by mouth daily. Swallow whole. 12/07/20   Fausto Burnard LABOR, DO  azelastine  (ASTELIN ) 0.1 % nasal spray Place 2 sprays into both nostrils daily as needed for rhinitis or allergies.    [provider]  BAQSIMI ONE PACK 3 MG/DOSE POWD Place 1 Dose into the nose once as needed (hypoglycemia). 07/31/23   [provider]  BLISOVI  FE 1.5/30 1.5-30 MG-MCG tablet Take 1 tablet by mouth daily. Patient not taking: Reported on 11/10/2023    [provider]  butalbital -acetaminophen -caffeine  (FIORICET ) 50-325-40 MG tablet Take 1 tablet by mouth daily as needed for headache. Patient not taking: Reported on 11/10/2023 09/26/22   [provider]  calcitRIOL  (ROCALTROL ) 0.25 MCG capsule Take 0.25 mcg by  mouth daily. 08/04/23   [provider]  carvedilol  (COREG ) 6.25 MG tablet Take 1.5 tablets (9.375 mg total) by mouth 2 (two) times daily. 08/07/23   Milford, Harlene HERO, FNP  Continuous Glucose Sensor (DEXCOM G7 SENSOR) MISC Inject 1 Device into the skin See admin instructions. Place 1 new sensor into the skin every 10 days 06/28/23   [provider]  CRANBERRY CONCENTRATE PO Take 1 tablet by mouth daily at 12 noon. Patient not taking: Reported on 11/10/2023    [provider]  CREON  36000-114000 units CPEP capsule Take 1 capsule (36,000 Units total) by mouth 3 (three) times daily. Patient not taking: Reported on 11/10/2023 05/10/23   Rai, Nydia POUR, MD  dicyclomine  (BENTYL ) 20 MG tablet Take 1 tablet (20 mg total) by mouth 2 (two) times daily. 09/05/23   Dreama Longs, MD  DULoxetine  (CYMBALTA ) 60 MG capsule Take 60 mg by mouth at bedtime. 09/26/22   [provider]  ergocalciferol  (VITAMIN D2) 1.25 MG (50000 UT) capsule Take 1 capsule (50,000 Units total) by mouth every Sunday. 11/12/23   Lee, [place], NP  fenofibrate  (TRICOR ) 145 MG tablet Take 1 tablet (145 mg total) by mouth daily. 09/03/23 09/02/24  Sebastian Toribio GAILS, MD  fenofibrate  54 MG tablet Take 1 tablet (54 mg total) by mouth daily. 12/01/22   [name], Vinie BROCKS, MD  Glucose Blood (BLOOD GLUCOSE TEST STRIPS) STRP 1 each by Does not apply route 3 (three) times daily. Use as directed to check blood sugar. May  dispense any manufacturer covered by patient's insurance and fits patient's device. 08/26/23   Shalhoub, Zachary PARAS, MD  HUMALOG  KWIKPEN 100 UNIT/ML KwikPen Inject 60-95 Units into the skin See admin instructions. Inject 60-95 units into the skin three times a day before meals, per sliding scale    [provider]  HUMULIN  R U-500 KWIKPEN 500 UNIT/ML KwikPen Inject 195-300 Units into the skin See admin instructions. 195 am bef breakfast300 hs    [provider]  hydrALAZINE  (APRESOLINE ) 50 MG tablet  Take 50 mg by mouth daily.    [provider]  Insulin  Pen Needle (PEN NEEDLES) 31G X 5 MM MISC Use as directed 3 (three) times daily. 08/26/23   Shalhoub, Zachary PARAS, MD  isosorbide  mononitrate (IMDUR ) 30 MG 24 hr tablet Take 1 tablet (30 mg total) by mouth daily. 11/10/23 02/08/24  Lee, [place], NP  ivabradine  (CORLANOR ) 7.5 MG TABS tablet Take 1 tablet (7.5 mg total) by mouth 2 (two) times daily with a meal. 01/31/23   Milford, Harlene HERO, FNP  Lancet Device MISC 1 each by Does not apply route 3 (three) times daily. May dispense any manufacturer covered by patient's insurance. 08/26/23   Shalhoub, Zachary PARAS, MD  Lancets MISC 1 each by Does not apply route 3 (three) times daily. Use as directed to check blood sugar. May dispense any manufacturer covered by patient's insurance and fits patient's device. 08/26/23   Shalhoub, Zachary PARAS, MD  lansoprazole  (PREVACID ) 30 MG capsule Take 1 capsule (30 mg total) by mouth 2 (two) times daily before a meal. 10/04/23   May, Deanna J, NP  levocetirizine (XYZAL ) 5 MG tablet Take 5 mg by mouth at bedtime. 03/05/22   [provider]  levothyroxine  (SYNTHROID ) 137 MCG tablet Take 137 mcg by mouth. 10/24/23 10/18/24  [provider]  levothyroxine  (SYNTHROID ) 150 MCG tablet Take 150 mcg by mouth daily before breakfast. Patient not taking: Reported on 11/10/2023    [provider]  metoCLOPramide  (REGLAN ) 10 MG tablet Take 1 tablet (10 mg total) by mouth every 6 (six) hours. Patient not taking: Reported on 11/10/2023 09/06/23   Mannie Pac T, DO  metolazone  (ZAROXOLYN ) 2.5 MG tablet Take 1 tablet (2.5 mg total) by mouth daily as needed. 12/06/23   [name], Toribio SAUNDERS, MD  Multiple Vitamin (MULTIVITAMIN WITH MINERALS) TABS tablet Take 1 tablet by mouth daily.    [provider]  oxyCODONE  (OXY IR/ROXICODONE ) 5 MG immediate release tablet Take 1 tablet (5 mg total) by mouth every 6 (six) hours as needed for severe pain (pain score 7-10).  08/26/23   Shalhoub, Zachary PARAS, MD  oxyCODONE  (ROXICODONE ) 5 MG immediate release tablet Take 1 tablet (5 mg total) by mouth every 4 (four) hours as needed for severe pain (pain score 7-10). Patient not taking: Reported on 11/10/2023 09/05/23   Dreama Longs, MD  polyethylene glycol (MIRALAX  / GLYCOLAX ) 17 g packet Take 17 g by mouth daily as needed for mild constipation. Also available OTC Patient not taking: Reported on 11/10/2023 05/10/23   Rai, Nydia POUR, MD  pregabalin  (LYRICA ) 300 MG capsule Take 300 mg by mouth at bedtime.    [provider]  prochlorperazine  (COMPAZINE ) 10 MG tablet Take 10 mg by mouth at bedtime. Preventative    [provider]  promethazine  (PHENERGAN ) 12.5 MG tablet Take 1 tablet (12.5 mg total) by mouth every 12 (twelve) hours as needed for nausea or vomiting. 09/28/23   May, Deanna J, NP  REPATHA  SURECLICK  140 MG/ML SOAJ Inject 140 mg into the skin every 14 (fourteen) days. Patient not taking: Reported on 11/10/2023 08/10/23   [provider]  rosuvastatin  (CRESTOR ) 10 MG tablet Take 1 tablet (10 mg total) by mouth daily. 09/04/23   Sebastian Toribio GAILS, MD  Safety Seal Miscellaneous MISC Hormonic Hair Solution with minoxidil USP 7% and finasteride USP 0.05% - apply to affected areas daily every morning. 06/06/23   Alm Delon SAILOR, DO  sodium bicarbonate  650 MG tablet Take 2 tablets (1,300 mg total) by mouth 2 (two) times daily. 05/10/23   Rai, Nydia POUR, MD  sucralfate  (CARAFATE ) 1 GM/10ML suspension Take 10 mLs (1 g total) by mouth 4 (four) times daily -  with meals and at bedtime. Patient not taking: Reported on 11/10/2023 09/06/23   Mannie Pac T, DO  topiramate  (TOPAMAX ) 50 MG tablet Take 50 mg by mouth 2 (two) times daily. Patient not taking: Reported on 11/10/2023    [provider]  torsemide  (DEMADEX ) 20 MG tablet Take 3 tablets (60 mg total) by mouth daily. 11/08/23   Donette [name] LABOR, FNP  TYLENOL  500 MG tablet Take 500-1,000 mg by  mouth every 6 (six) hours as needed for mild pain (pain score 1-3) (or headaches).    [provider]    Allergies: Icosapent  ethyl (epa ethyl ester) (fish), Ketamine, [place], Morphine , Penicillins, Mushroom, Shellfish allergy, Fentanyl , and Prednisone    Review of Systems  Updated Vital Signs BP (!) 171/108   Pulse (!) 120   Temp 98.5 F (36.9 C)   Resp 20   Ht 4' 9 (1.448 m)   Wt 68 kg   SpO2 96%   BMI 32.46 kg/m   Physical Exam Vitals and nursing note reviewed.  Constitutional:      General: [name] not in acute distress.    Appearance: [name] well-developed.  HENT:     Head: Normocephalic and atraumatic.     Mouth/Throat:     Mouth: Mucous membranes are moist.  Eyes:     General: Vision grossly intact. Gaze aligned appropriately.     Extraocular Movements: Extraocular movements intact.     Conjunctiva/sclera: Conjunctivae normal.  Cardiovascular:     Rate and Rhythm: Regular rhythm. Tachycardia present.     Pulses: Normal pulses.     Heart sounds: Normal heart sounds, S1 normal and S2 normal. No murmur heard.    No friction rub. No gallop.  Pulmonary:     Effort: Pulmonary effort is normal. No respiratory distress.     Breath sounds: Normal breath sounds.  Abdominal:     General: Bowel sounds are normal. There is distension.     Palpations: Abdomen is soft.     Tenderness: There is no abdominal tenderness. There is no guarding or rebound.     Hernia: No hernia is present.  Musculoskeletal:        General: No swelling.     Cervical back: Full passive range of motion without pain, normal range of motion and neck supple. No spinous process tenderness or muscular tenderness. Normal range of motion.     Right lower leg: No edema.     Left lower leg: No edema.  Skin:    General: Skin is warm and dry.     Capillary Refill: Capillary refill takes less than 2 seconds.     Findings: No ecchymosis, erythema, rash or wound.  Neurological:     General: No focal  deficit present.  Mental Status: [name] alert and oriented to person, place, and time.     GCS: GCS eye subscore is 4. GCS verbal subscore is 5. GCS motor subscore is 6.     Cranial Nerves: Cranial nerves 2-12 are intact.     Sensory: Sensation is intact.     Motor: Motor function is intact.     Coordination: Coordination is intact.  Psychiatric:        Attention and Perception: Attention normal.        Mood and Affect: Mood normal.        Speech: Speech normal.        Behavior: Behavior normal.     (all labs ordered are listed, but only abnormal results are displayed) Labs Reviewed  COMPREHENSIVE METABOLIC PANEL WITH GFR - Abnormal; Notable for the following components:      Result Value   Sodium 127 (*)    Chloride 88 (*)    CO2 21 (*)    Glucose, Bld 218 (*)    BUN 42 (*)    Creatinine, Ser 1.63 (*)    Alkaline Phosphatase 178 (*)    GFR, Estimated 43 (*)    Anion gap 18 (*)    All other components within normal limits  CBC - Abnormal; Notable for the following components:   RBC 3.53 (*)    Hemoglobin 10.8 (*)    HCT 31.5 (*)    RDW 15.9 (*)    Platelets 113 (*)    All other components within normal limits  URINALYSIS, ROUTINE W REFLEX MICROSCOPIC - Abnormal; Notable for the following components:   Color, Urine STRAW (*)    Glucose, UA TRACE (*)    Hgb urine dipstick TRACE (*)    Protein, ur 1+ (*)    All other components within normal limits  PRO BRAIN NATRIURETIC PEPTIDE - Abnormal; Notable for the following components:   Pro Brain Natriuretic Peptide 433.0 (*)    All other components within normal limits  CBG MONITORING, ED - Abnormal; Notable for the following components:   Glucose-Capillary 213 (*)    All other components within normal limits  TROPONIN T, HIGH SENSITIVITY - Abnormal; Notable for the following components:   Troponin T High Sensitivity 77 (*)    All other components within normal limits  LIPASE, BLOOD  PREGNANCY, URINE  URINALYSIS,  MICROSCOPIC (REFLEX)  TROPONIN T, HIGH SENSITIVITY    EKG: EKG Interpretation Date/Time:  Thursday December 07 2023 00:01:18 EDT Ventricular Rate:  133 PR Interval:  122 QRS Duration:  82 QT Interval:  332 QTC Calculation: 494 R Axis:   40  Text Interpretation: Sinus tachycardia Otherwise normal ECG When compared with ECG of 05-Sep-2023 03:24, No significant change since last tracing Confirmed by Haze Lonni PARAS [id number]) on 12/07/2023 4:25:04 AM  Radiology: DG Chest 2 View Result Date: 12/07/2023 EXAM: 2 VIEW(S) XRAY OF THE CHEST 12/07/2023 02:53:39 AM COMPARISON: None available. CLINICAL HISTORY: 141880 SOB (shortness of breath) [id number]. Table formatting from the original note was not included. Reports several days of abdominal distention and suspected fluid overload. Says she has gained 5lbs this week and stomach pain has become unbearable. FINDINGS: LUNGS AND PLEURA: No focal pulmonary opacity. No pulmonary edema. No pleural effusion. No pneumothorax. HEART AND MEDIASTINUM: Stable cardiomegaly. CardioMEMS device. BONES AND SOFT TISSUES: No acute osseous abnormality. IMPRESSION: 1. No acute process. Electronically signed by: Norman Gatlin MD 12/07/2023 02:56 AM EDT RP Workstation: [id number]     Procedures  Medications Ordered in the ED  labetalol  (NORMODYNE ) injection 10 mg (has no administration in time range)  insulin  aspart (novoLOG ) injection 0-15 Units (has no administration in time range)  insulin  aspart (novoLOG ) injection 0-5 Units (has no administration in time range)  furosemide  (LASIX ) injection 80 mg (80 mg Intravenous Given 12/07/23 0405)                                    Medical Decision Making Amount and/or Complexity of Data Reviewed External Data Reviewed: labs, radiology, ECG and notes. Labs: ordered. Decision-making details documented in ED Course. Radiology: ordered and independent interpretation performed. Decision-making details documented in ED  Course. ECG/medicine tests: ordered and independent interpretation performed. Decision-making details documented in ED Course.  Risk Prescription drug management. Decision regarding hospitalization.   Differential Diagnosis considered includes, but not limited to: CHF exacerbation; ACS/MI; small bowel obstruction  Patient presents with abdominal distention.  [name] not experiencing any significant abdominal pain, denies nausea, vomiting, diarrhea.  She does monitor her weight and has gained at least 5 pounds this week.  She was instructed to take Zaroxolyn  by her doctor but it has not resulted in any increased urinary output.  Patient's heart rate is elevated.  She reports that this is normal for her.  This is confirmed by EKG review.  Patient also very hypertensive, reports that her blood pressure is always elevated.  She has not missed any of her medications.  Patient's troponin is elevated.  This is consistent with leak, she is not experiencing any chest pain or having ischemic changes.  Patient without shortness of breath or hypoxia, no oxygen demand.  Patient given IV Lasix , will admit to hospitalist service for further diuresis.     Final diagnoses:  Congestive heart failure, unspecified HF chronicity, unspecified heart failure type [facility])    ED Discharge Orders     None          Haze Lonni PARAS, MD 12/07/23 [phone]

## 2023-12-07 NOTE — Plan of Care (Addendum)
  Persistent elevated blood glucose still elevated improved slightly from 473 to 452 after receiving 10 unit of NovoLog  and 30 units of Lantus .  Giving another 8 units of NovoLog . Changing sliding scale coverage from mealtime to every 6 hour scheduled.  Found out that patient was on regular diet with 2 L fluid restriction.  Changing diet to heart healthy-carb modified diet with fluid restriction 2 L/day and salt restriction 2 g/day.  Also treating patient for CHF exacerbation.  Maureen Delatte, MD [facility] 12/07/2023, 11:21 PM

## 2023-12-07 NOTE — Plan of Care (Signed)
   Problem: Coping: Goal: Ability to adjust to condition or change in health will improve Outcome: Progressing   Problem: Fluid Volume: Goal: Ability to maintain a balanced intake and output will improve Outcome: Progressing   Problem: Health Behavior/Discharge Planning: Goal: Ability to identify and utilize available resources and services will improve Outcome: Progressing

## 2023-12-07 NOTE — ED Triage Notes (Signed)
 Reports several days of abdominal distention and suspected fluid overload. Says she has gained 5lbs this week and stomach pain has become unbearable.

## 2023-12-07 NOTE — Progress Notes (Signed)
   12/07/23 2225  BiPAP/CPAP/SIPAP  $ Non-Invasive Ventilator  Non-Invasive Vent Initial;Non-Invasive Vent Set Up  $ Face Mask Medium Yes  BiPAP/CPAP/SIPAP Pt Type Adult  BiPAP/CPAP/SIPAP Resmed  Mask Type Full face mask  Dentures removed? Not applicable  Mask Size Medium  Respiratory Rate 16 breaths/min  FiO2 (%) 21 %  Flow Rate 0 lpm  Patient Home Machine No  Patient Home Mask No  Patient Home Tubing No  Auto Titrate Yes  Nasal massage performed No (comment)  CPAP/SIPAP surface wiped down Yes  Device Plugged into RED Power Outlet Yes  BiPAP/CPAP /SiPAP Vitals  Bilateral Breath Sounds Clear;Diminished

## 2023-12-07 NOTE — Plan of Care (Signed)
 RN reported that patient blood glucose is elevated 473. Patient have not received bedtime insulin  and long-acting insulin  yet. - Recommended to give the Lantus  30 unit and sliding scale insulin  10 unit as well. Will recheck the blood glucose again in 1 hour if still elevated will give sliding scale insulin  as appropriate.  -Also per patient request resume home pregabalin  300 mg at bedtime.

## 2023-12-07 NOTE — H&P (Signed)
 History and Physical    Jennifer [name] FMW:[id number] DOB: 1990/10/31 DOA: 12/07/2023  I have briefly reviewed the patient's prior medical records in [facility], [place]  PCP: Emilio Joesph DEL, PA-C  Patient coming from: Home  Chief Complaint: Abdominal discomfort, swelling  HPI: Jennifer [name] is a 33 y.o. 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  She reports that this is typical for CHF exacerbations, she rarely gets fluid in her legs but mostly gets abdominal distention and discomfort.  She states she gained about 5 pounds which is enough to make her uncomfortable.  She denies any fever or chills.  She has had some chest pain earlier on when she was also short of breath, but now she received Lasix  in the ER prior to admission and she is chest pain-free.  She reports compliance with her home medications, denies missing doses.  She reports compliance with a low-sodium diet.  She states that she has a device at home that helps her determine her fluid overload, and it did show so yesterday, she called her doctor which told her to take metolazone  x 2 days.  She took 1 yesterday, did not feel she produced adequate urine, continued to feel bad and she came to the ER  ED Course: In the emergency room she was afebrile, initially her blood pressure on presentation was 201/123, heart rate 120.  EKG shows sinus rhythm.  Blood work revealed a sodium of 127, glucose of 218, creatinine of 1.6.  proBNP was elevated to 443, troponin was slightly elevated 77 and repeat 75.  She was given Lasix , was given antihypertensives and she was admitted to the hospital.  From the time she was accepted from [facility] until she actually  arrived to the floor and I evaluated patient, about 12 hours have passed  Review of Systems: All systems reviewed, and apart from HPI, all negative  Past Medical History:  Diagnosis Date   Afib (HCC) 05/12/2021   Brain tumor (HCC) 03/29/1995   astrocytoma   CHF (congestive heart failure) (HCC)    Cholesterosis    CKD (chronic kidney disease) stage 4, GFR 15-29 ml/min (HCC) 05/13/2021   DM (diabetes mellitus) (HCC) 10/10/2018   Fatty liver    Gastroparesis due to DM (HCC) 05/12/2021   HTN (hypertension) 10/10/2018   Hypothyroidism 10/10/2018   Lipoprotein deficiency    Lung disease    longevity long term   Pancreatitis    Polycystic ovary syndrome     Past Surgical History:  Procedure Laterality Date   ABDOMINAL SURGERY     pt states during miscarriage got her intestine   BRAIN SURGERY     ESOPHAGOGASTRODUODENOSCOPY N/A 09/01/2023   Procedure: EGD (ESOPHAGOGASTRODUODENOSCOPY);  Surgeon: Abran Norleen SAILOR, MD;  Location: [facility];  Service: Gastroenterology;  Laterality: N/A;   EYE MUSCLE SURGERY Right 03/28/2014   PRESSURE SENSOR/CARDIOMEMS N/A 12/06/2021   Procedure: PRESSURE SENSOR/CARDIOMEMS;  Surgeon: [name] Toribio SAUNDERS, MD;  Location: [facility];  Service: Cardiovascular;  Laterality: N/A;   RIGHT HEART CATH N/A 08/05/2021   Procedure: RIGHT HEART CATH;  Surgeon: [name] Toribio SAUNDERS, MD;  Location: [facility];  Service: Cardiovascular;  Laterality: N/A;   RIGHT HEART CATH N/A 12/06/2021   Procedure: RIGHT HEART CATH;  Surgeon:  [name], Toribio SAUNDERS, MD;  Location: [facility];  Service: Cardiovascular;  Laterality: N/A;   RIGHT/LEFT HEART CATH AND CORONARY ANGIOGRAPHY N/A 12/03/2020   Procedure: RIGHT/LEFT HEART CATH AND CORONARY ANGIOGRAPHY;  Surgeon: Ladona Heinz, MD;  Location: [facility];  Service: Cardiovascular;  Laterality: N/A;   VENTRICULOSTOMY  03/28/1997     reports that she has never smoked. She has never used smokeless tobacco. She  reports that she does not drink alcohol  and does not use drugs.  Allergies  Allergen Reactions   Icosapent  Ethyl (Epa Ethyl Ester) (Fish) Hives   Ketamine Other (See Comments)    In a vegetative state for 15 minutes, per pt   Maitake Itching and Other (See Comments)    Itchy throat   Morphine  Hives, Itching, Rash and Other (See Comments)    Sore throat, too   Penicillins Hives, Itching and Rash    Has patient had a PCN reaction causing immediate rash, facial/tongue/throat swelling, SOB or lightheadedness with hypotension: Y Has patient had a PCN reaction causing severe rash involving mucus membranes or skin necrosis: Y Has patient had a PCN reaction that required hospitalization: N Has patient had a PCN reaction occurring within the last 10 years: Y   Mushroom Other (See Comments)    Pt has never had mushrooms but tested positive on allergy test   Shellfish Allergy Other (See Comments)    Pt has never had shellfish, but tested positive on allergy test. Pt states contrast in CT is okay.   Fentanyl  Nausea And Vomiting   Prednisone Rash    Family History  Problem Relation Age of Onset   Diabetes Mother    Hypertension Mother    Hyperlipidemia Mother    Thyroid  disease Mother    Hypertension Father    Diabetes Father    Pancreatic cancer Paternal Aunt    Pancreatic cancer Paternal Uncle    Colon cancer Neg Hx    Esophageal cancer Neg Hx    Stomach cancer Neg Hx     Prior to Admission medications   Medication Sig Start Date End Date Taking? Authorizing Provider  amitriptyline  (ELAVIL ) 10 MG tablet Take 10 mg by mouth at bedtime.    [provider]  aspirin  EC 81 MG EC tablet Take 1 tablet (81 mg total) by mouth daily. Swallow whole. 12/07/20   Fausto Sor A, DO  azelastine  (ASTELIN ) 0.1 % nasal spray Place 2 sprays into both nostrils daily as needed for rhinitis or allergies.    [provider]  BAQSIMI ONE PACK 3 MG/DOSE POWD Place 1 Dose into the nose  once as needed (hypoglycemia). 07/31/23   [provider]  calcitRIOL  (ROCALTROL ) 0.25 MCG capsule Take 0.25 mcg by mouth daily. 08/04/23   [provider]  carvedilol  (COREG ) 6.25 MG tablet Take 1.5 tablets (9.375 mg total) by mouth 2 (two) times daily. 08/07/23   Milford, Harlene HERO, FNP  Continuous Glucose Sensor (DEXCOM G7 SENSOR) MISC Inject 1 Device into the skin See admin instructions. Place 1 new sensor into the skin every 10 days 06/28/23   [provider]  CREON  36000-114000 units CPEP capsule Take 1 capsule (36,000 Units total) by mouth 3 (three) times daily. Patient not taking: Reported on 11/10/2023 05/10/23   Rai, Nydia POUR, MD  dicyclomine  (BENTYL ) 20 MG tablet Take 1 tablet (20 mg total) by mouth 2 (two) times daily. 09/05/23   Dreama Longs, MD  DULoxetine  (CYMBALTA ) 60 MG capsule Take 60  mg by mouth at bedtime. 09/26/22   [provider]  ergocalciferol  (VITAMIN D2) 1.25 MG (50000 UT) capsule Take 1 capsule (50,000 Units total) by mouth every Sunday. 11/12/23   Lee, [place], NP  fenofibrate  54 MG tablet Take 1 tablet (54 mg total) by mouth daily. 12/01/22   [name], Vinie BROCKS, MD  Glucose Blood (BLOOD GLUCOSE TEST STRIPS) STRP 1 each by Does not apply route 3 (three) times daily. Use as directed to check blood sugar. May dispense any manufacturer covered by patient's insurance and fits patient's device. 08/26/23   Shalhoub, Zachary PARAS, MD  HUMALOG  KWIKPEN 100 UNIT/ML KwikPen Inject 60-95 Units into the skin See admin instructions. Inject 60-95 units into the skin three times a day before meals, per sliding scale    [provider]  HUMULIN  R U-500 KWIKPEN 500 UNIT/ML KwikPen Inject 195-300 Units into the skin See admin instructions. 195 am bef breakfast300 hs    [provider]  hydrALAZINE  (APRESOLINE ) 50 MG tablet Take 50 mg by mouth daily.    [provider]  Insulin  Pen Needle (PEN NEEDLES) 31G X 5 MM MISC Use as directed 3 (three)  times daily. 08/26/23   Shalhoub, Zachary PARAS, MD  isosorbide  mononitrate (IMDUR ) 30 MG 24 hr tablet Take 1 tablet (30 mg total) by mouth daily. 11/10/23 02/08/24  Lee, [place], NP  ivabradine  (CORLANOR ) 7.5 MG TABS tablet Take 1 tablet (7.5 mg total) by mouth 2 (two) times daily with a meal. 01/31/23   Milford, Harlene HERO, FNP  Lancet Device MISC 1 each by Does not apply route 3 (three) times daily. May dispense any manufacturer covered by patient's insurance. 08/26/23   Shalhoub, Zachary PARAS, MD  Lancets MISC 1 each by Does not apply route 3 (three) times daily. Use as directed to check blood sugar. May dispense any manufacturer covered by patient's insurance and fits patient's device. 08/26/23   Shalhoub, Zachary PARAS, MD  lansoprazole  (PREVACID ) 30 MG capsule Take 1 capsule (30 mg total) by mouth 2 (two) times daily before a meal. 10/04/23   May, Deanna J, NP  levocetirizine (XYZAL ) 5 MG tablet Take 5 mg by mouth at bedtime. 03/05/22   [provider]  levothyroxine  (SYNTHROID ) 137 MCG tablet Take 137 mcg by mouth. 10/24/23 10/18/24  [provider]  metoCLOPramide  (REGLAN ) 10 MG tablet Take 1 tablet (10 mg total) by mouth every 6 (six) hours. Patient not taking: Reported on 11/10/2023 09/06/23   Mannie Pac T, DO  metolazone  (ZAROXOLYN ) 2.5 MG tablet Take 1 tablet (2.5 mg total) by mouth daily as needed. 12/06/23   [name], Toribio SAUNDERS, MD  Multiple Vitamin (MULTIVITAMIN WITH MINERALS) TABS tablet Take 1 tablet by mouth daily.    [provider]  oxyCODONE  (ROXICODONE ) 5 MG immediate release tablet Take 1 tablet (5 mg total) by mouth every 4 (four) hours as needed for severe pain (pain score 7-10). Patient not taking: Reported on 11/10/2023 09/05/23   Dreama Longs, MD  polyethylene glycol (MIRALAX  / GLYCOLAX ) 17 g packet Take 17 g by mouth daily as needed for mild constipation. Also available OTC Patient not taking: Reported on 11/10/2023 05/10/23   Rai, Nydia POUR, MD  pregabalin  (LYRICA )  300 MG capsule Take 300 mg by mouth at bedtime.    [provider]  prochlorperazine  (COMPAZINE ) 10 MG tablet Take 10 mg by mouth at bedtime. Preventative    [provider]  promethazine  (PHENERGAN ) 12.5 MG tablet Take 1 tablet (12.5 mg total)  by mouth every 12 (twelve) hours as needed for nausea or vomiting. 09/28/23   May, Deanna J, NP  rosuvastatin  (CRESTOR ) 10 MG tablet Take 1 tablet (10 mg total) by mouth daily. 09/04/23   Sebastian Toribio GAILS, MD  Safety Seal Miscellaneous MISC Hormonic Hair Solution with minoxidil USP 7% and finasteride USP 0.05% - apply to affected areas daily every morning. 06/06/23   Alm Delon SAILOR, DO  sodium bicarbonate  650 MG tablet Take 2 tablets (1,300 mg total) by mouth 2 (two) times daily. 05/10/23   Rai, Nydia POUR, MD  sucralfate  (CARAFATE ) 1 GM/10ML suspension Take 10 mLs (1 g total) by mouth 4 (four) times daily -  with meals and at bedtime. Patient not taking: Reported on 11/10/2023 09/06/23   Mannie Pac T, DO  torsemide  (DEMADEX ) 20 MG tablet Take 3 tablets (60 mg total) by mouth daily. 11/08/23   Donette [name] LABOR, FNP  TYLENOL  500 MG tablet Take 500-1,000 mg by mouth every 6 (six) hours as needed for mild pain (pain score 1-3) (or headaches).    [provider]    Physical Exam: Vitals:   12/07/23 1300 12/07/23 1330 12/07/23 1410 12/07/23 1526  BP: 128/75 120/68 124/64 137/85  Pulse:   (!) 107 100  Resp: (!) 21 19 18 18   Temp:   98 F (36.7 C) 98.9 F (37.2 C)  TempSrc:   Oral Oral  SpO2:   97% 97%  Weight:    66.1 kg  Height:    4' 9 (1.448 m)   Constitutional: NAD, calm, comfortable Eyes: PERRL, lids and conjunctivae normal ENMT: Mucous membranes are moist.  Neck: normal, supple Respiratory: clear to auscultation bilaterally, no wheezing, no crackles.  Diminished at the bases Cardiovascular: Regular rate and rhythm, slightly tachycardic, no lower extremity edema Abdomen: Distended, nontender, bowel sounds  positive Musculoskeletal: no clubbing / cyanosis. Normal muscle tone.  Skin: no rashes, lesions, ulcers. No induration Neurologic: Nonfocal  Labs on Admission: I have personally reviewed following labs and imaging studies  CBC: Recent Labs  Lab 12/07/23 0018  WBC 5.9  HGB 10.8*  HCT 31.5*  MCV 89.2  PLT 113*   Basic Metabolic Panel: Recent Labs  Lab 12/07/23 0018  NA 127*  K 4.2  CL 88*  CO2 21*  GLUCOSE 218*  BUN 42*  CREATININE 1.63*  CALCIUM  9.3   Liver Function Tests: Recent Labs  Lab 12/07/23 0018  AST LIPEMIC SPECIMEN, RESULTS MAY BE AFFECTED.  ALT LIPEMIC SPECIMEN, RESULTS MAY BE AFFECTED.  ALKPHOS 178*  BILITOT 0.5  PROT 7.4  ALBUMIN  4.1   Coagulation Profile: No results for input(s): INR, PROTIME in the last 168 hours. BNP (last 3 results) Recent Labs    12/07/23 0018  PROBNP 433.0*   CBG: Recent Labs  Lab 12/07/23 0017 12/07/23 0754 12/07/23 1213 12/07/23 1551  GLUCAP 213* 269* 262* 325*   Thyroid  Function Tests: No results for input(s): TSH, T4TOTAL, FREET4, T3FREE, THYROIDAB in the last 72 hours. Urine analysis:    Component Value Date/Time   COLORURINE STRAW (A) 12/07/2023 0018   APPEARANCEUR CLEAR 12/07/2023 0018   LABSPEC 1.008 12/07/2023 0018   PHURINE 5.5 12/07/2023 0018   GLUCOSEU TRACE (A) 12/07/2023 0018   HGBUR TRACE (A) 12/07/2023 0018   BILIRUBINUR NEGATIVE 12/07/2023 0018   KETONESUR NEGATIVE 12/07/2023 0018   PROTEINUR 1+ (A) 12/07/2023 0018   NITRITE NEGATIVE 12/07/2023 0018   LEUKOCYTESUR NEGATIVE 12/07/2023 0018     Radiological Exams on Admission: DG  Chest 2 View Result Date: 12/07/2023 EXAM: 2 VIEW(S) XRAY OF THE CHEST 12/07/2023 02:53:39 AM COMPARISON: None available. CLINICAL HISTORY: 141880 SOB (shortness of breath) 141880. Table formatting from the original note was not included. Reports several days of abdominal distention and suspected fluid overload. Says she has gained 5lbs this week and  stomach pain has become unbearable. FINDINGS: LUNGS AND PLEURA: No focal pulmonary opacity. No pulmonary edema. No pleural effusion. No pneumothorax. HEART AND MEDIASTINUM: Stable cardiomegaly. CardioMEMS device. BONES AND SOFT TISSUES: No acute osseous abnormality. IMPRESSION: 1. No acute process. Electronically signed by: Norman Gatlin MD 12/07/2023 02:56 AM EDT RP Workstation: [id number]    EKG: Independently reviewed.  Sinus tachycardia  Assessment/Plan Principal problem Acute on chronic combined CHF -she received 80 mg of IV Lasix  in the ER this morning, tells me she is feeling better and had good urine output.  Continue Lasix  80 mg twice daily - Metolazone  x 1 tonight - Strict ins and outs, daily weights  Active problems Hypertensive urgency-were started on Coreg , hydralazine , furosemide .  Blood pressure now normalized.  Continue  Type 2 diabetes mellitus -poorly controlled, with hyperglycemia.  [name] on very high doses of insulin  at home, started here a lower regimen and adjust as needed  Hypothyroidism -most recent 3 TSH is earlier this year quite abnormal, repeat another TSH  Chronic kidney disease stage V -baseline creatinine ranging anywhere between 1.7 and 2.3 at least over the last few months, currently slightly better than baseline.  Hyponatremia -watch sodium with diuresis, but she is also hyperglycemic so there is a component of pseudohyponatremia  Thrombocytopenia-has been thrombocytopenia before, no bleeding, monitor  OSA-placed on CPAP  Obesity, class I-BMI 31, will have to re-calculate once she gets diuresed  Hyperlipidemia-continue home medications  Depression-med rec reconciliation pending  Scheduled Meds:  carvedilol   9.375 mg Oral BID   enoxaparin  (LOVENOX ) injection  30 mg Subcutaneous Q24H   furosemide   80 mg Intravenous Q12H   hydrALAZINE   50 mg Oral Q8H   insulin  aspart  0-15 Units Subcutaneous TID WC   [START ON 12/08/2023] insulin  aspart  0-15  Units Subcutaneous TID WC   insulin  aspart  0-5 Units Subcutaneous QHS   insulin  aspart  0-5 Units Subcutaneous QHS   insulin  glargine-yfgn  30 Units Subcutaneous QHS   metolazone   2.5 mg Oral Once   Continuous Infusions: PRN Meds:.labetalol    DVT prophylaxis: Lovenox  Code Status: Full code Family Communication: No family at bedside Bed Type: Progressive Consults called: None Obs/Inp: Inpatient by night admitting MD  At the time of admission, it appears that the appropriate admission status for this patient is INPATIENT as it is expected that patient will require hospital care > 2 midnights. This is judged to be reasonable and necessary in order to provide the required intensity of service to ensure the patient's safety given: presenting symptoms, initial radiographic and laboratory data and in the context of their chronic comorbidities. Together, these circumstances are felt to place patient at high at high risk for further clinical deterioration threatening life, limb, or organ.  Nilda Fendt, MD, PhD [facility]  Contact via [email or web address]  12/07/2023, 4:11 PM

## 2023-12-07 NOTE — Plan of Care (Addendum)
 [facility] emergency department to [facility] progressive unit transfer:  33 year old female astrocytoma s/p resection in 1997 with residual L-sided weakness,  HFrEF 29% recovered to 45 to 50%, CAD, CKD stage V, obstructive sleep apnea, chronic abdominal swelling and bloating, chronic sinus tachycardia, pulmonary hypertension, tricuspid regurgitation, essential hypertension, severe hypertriglyceridemia due LPL deficiency, chronic pancreatitis, type IIIc DM secondary to chronic pancreatitis on insulin , and NAFLD presented to emergency department complaining about abdominal distention, fluid overload and gained 5 pounds of weight over the course of last 1 week.  At presentation to ED patient found hypertensive systolic blood pressure 201 and diastolic 123 which improved to 171/108.  Tachycardic heart rate up to 127.  Normal respiratory rate, O2 sat 96% room air and afebrile.  EKG showing sinus tachycardia heart rate 133. Chest x-ray no active disease process. Elevated troponin 77 pending second troponin.  Elevated proBNP 433. Pregnancy test negative.  UA unremarkable except protein 1+.   CBC stable H&H, normal WBC and low platelet count 113. CMP showing low sodium 127, low chloride 88, low bicarb 21, creatinine 1.63-at baseline, chronically elevated ALP 176, admitted under gap 18.  Normal lipase level.  In the ED patient has been given IV Lasix  80 mg.  In the ED patient reported that usually she will do her fluids around her belly more so than her lower extremities.  Patient also has medication noncompliance per chart review of cardiology note-clinic visit.  Hospitalist has been consulted for further evaluation management of acute on chronic CHF exacerbation, hypertensive emergency, elevated troponin-demand ischemia, hyponatremia and anion gap metabolic acidosis.  Kimora Stankovic, MD [facility] 12/07/2023, 4:23 AM

## 2023-12-08 DIAGNOSIS — I5043 Acute on chronic combined systolic (congestive) and diastolic (congestive) heart failure: Secondary | ICD-10-CM | POA: Diagnosis not present

## 2023-12-08 LAB — COMPREHENSIVE METABOLIC PANEL WITH GFR
ALT: 152 U/L — ABNORMAL HIGH (ref 0–44)
AST: 105 U/L — ABNORMAL HIGH (ref 15–41)
Albumin: 4.2 g/dL (ref 3.5–5.0)
Alkaline Phosphatase: 147 U/L — ABNORMAL HIGH (ref 38–126)
BUN: 76 mg/dL — ABNORMAL HIGH (ref 6–20)
CO2: 16 mmol/L — ABNORMAL LOW (ref 22–32)
Calcium: 9.1 mg/dL (ref 8.9–10.3)
Chloride: 88 mmol/L — ABNORMAL LOW (ref 98–111)
Creatinine, Ser: 2.4 mg/dL — ABNORMAL HIGH (ref 0.44–1.00)
GFR, Estimated: 27 mL/min — ABNORMAL LOW (ref >60)
Glucose, Bld: 463 mg/dL — ABNORMAL HIGH (ref 70–99)
Potassium: 5.7 mmol/L — ABNORMAL HIGH (ref 3.5–5.1)
Sodium: 127 mmol/L — ABNORMAL LOW (ref 135–145)
Total Bilirubin: 1.1 mg/dL (ref 0.0–1.2)
Total Protein: 7.4 g/dL (ref 6.5–8.1)

## 2023-12-08 LAB — CBC
HCT: 35.6 % — ABNORMAL LOW (ref 36.0–46.0)
Hemoglobin: 13.5 g/dL (ref 12.0–15.0)
MCH: 32.3 pg (ref 26.0–34.0)
MCHC: 35.7 g/dL (ref 30.0–36.0)
MCV: 90.6 fL (ref 80.0–100.0)
Platelets: 148 K/uL — ABNORMAL LOW (ref 150–400)
RBC: 3.87 MIL/uL (ref 3.87–5.11)
RDW: 15.9 % — ABNORMAL HIGH (ref 11.5–15.5)
WBC: 7.6 K/uL (ref 4.0–10.5)
nRBC: 0 % (ref 0.0–0.2)

## 2023-12-08 LAB — GLUCOSE, CAPILLARY
Glucose-Capillary: 347 mg/dL — ABNORMAL HIGH (ref 70–99)
Glucose-Capillary: 453 mg/dL — ABNORMAL HIGH (ref 70–99)
Glucose-Capillary: 470 mg/dL — ABNORMAL HIGH (ref 70–99)
Glucose-Capillary: 447 mg/dL — ABNORMAL HIGH (ref 70–99)
Glucose-Capillary: 393 mg/dL — ABNORMAL HIGH (ref 70–99)
Glucose-Capillary: 290 mg/dL — ABNORMAL HIGH (ref 70–99)
Glucose-Capillary: 362 mg/dL — ABNORMAL HIGH (ref 70–99)
Glucose-Capillary: 242 mg/dL — ABNORMAL HIGH (ref 70–99)

## 2023-12-08 LAB — HIV ANTIBODY (ROUTINE TESTING W REFLEX): HIV Screen 4th Generation wRfx: NONREACTIVE

## 2023-12-08 LAB — MAGNESIUM: Magnesium: 2.8 mg/dL — ABNORMAL HIGH (ref 1.7–2.4)

## 2023-12-08 LAB — PHOSPHORUS: Phosphorus: 6.4 mg/dL — ABNORMAL HIGH (ref 2.5–4.6)

## 2023-12-08 LAB — TSH: TSH: 3.174 u[IU]/mL (ref 0.350–4.500)

## 2023-12-08 MED ORDER — INSULIN ASPART 100 UNIT/ML IJ SOLN
5.0000 [IU] | Freq: Three times a day (TID) | INTRAMUSCULAR | Status: DC
  Administered 2023-12-08: 5 [IU] via SUBCUTANEOUS

## 2023-12-08 MED ORDER — INSULIN ASPART 100 UNIT/ML IJ SOLN
0.0000 [IU] | Freq: Three times a day (TID) | INTRAMUSCULAR | Status: DC
  Administered 2023-12-08 (×2): 15 [IU] via SUBCUTANEOUS
  Administered 2023-12-09: 8 [IU] via SUBCUTANEOUS
  Administered 2023-12-09: 5 [IU] via SUBCUTANEOUS
  Administered 2023-12-09: 15 [IU] via SUBCUTANEOUS
  Administered 2023-12-10: 2 [IU] via SUBCUTANEOUS
  Administered 2023-12-10: 15 [IU] via SUBCUTANEOUS
  Administered 2023-12-10: 11 [IU] via SUBCUTANEOUS
  Administered 2023-12-11 – 2023-12-12 (×4): 5 [IU] via SUBCUTANEOUS
  Administered 2023-12-12: 11 [IU] via SUBCUTANEOUS
  Administered 2023-12-12: 5 [IU] via SUBCUTANEOUS
  Administered 2023-12-13: 3 [IU] via SUBCUTANEOUS
  Administered 2023-12-13 (×2): 2 [IU] via SUBCUTANEOUS
  Administered 2023-12-14: 5 [IU] via SUBCUTANEOUS
  Administered 2023-12-14: 8 [IU] via SUBCUTANEOUS

## 2023-12-08 MED ORDER — INSULIN ASPART 100 UNIT/ML IJ SOLN
15.0000 [IU] | Freq: Three times a day (TID) | INTRAMUSCULAR | Status: DC
  Administered 2023-12-08 – 2023-12-14 (×18): 15 [IU] via SUBCUTANEOUS

## 2023-12-08 MED ORDER — INSULIN GLARGINE 100 UNIT/ML ~~LOC~~ SOLN
30.0000 [IU] | Freq: Two times a day (BID) | SUBCUTANEOUS | Status: DC
  Administered 2023-12-08 – 2023-12-11 (×7): 30 [IU] via SUBCUTANEOUS
  Filled 2023-12-08 (×8): qty 0.3

## 2023-12-08 MED ORDER — FAMOTIDINE 20 MG PO TABS
20.0000 mg | ORAL_TABLET | Freq: Two times a day (BID) | ORAL | Status: DC
  Administered 2023-12-08 – 2023-12-12 (×8): 20 mg via ORAL
  Filled 2023-12-08 (×8): qty 1

## 2023-12-08 MED ORDER — FUROSEMIDE 10 MG/ML IJ SOLN
120.0000 mg | Freq: Two times a day (BID) | INTRAMUSCULAR | Status: DC
  Administered 2023-12-08 – 2023-12-12 (×8): 120 mg via INTRAVENOUS
  Filled 2023-12-08: qty 12
  Filled 2023-12-08: qty 120
  Filled 2023-12-08 (×3): qty 10
  Filled 2023-12-08: qty 12
  Filled 2023-12-08: qty 10
  Filled 2023-12-08: qty 12
  Filled 2023-12-08: qty 2

## 2023-12-08 MED ORDER — METOLAZONE 5 MG PO TABS
5.0000 mg | ORAL_TABLET | Freq: Once | ORAL | Status: AC
  Administered 2023-12-08: 5 mg via ORAL
  Filled 2023-12-08: qty 1

## 2023-12-08 MED ORDER — INSULIN ASPART 100 UNIT/ML IJ SOLN
10.0000 [IU] | Freq: Once | INTRAMUSCULAR | Status: AC
  Administered 2023-12-08: 10 [IU] via SUBCUTANEOUS

## 2023-12-08 MED ORDER — SODIUM ZIRCONIUM CYCLOSILICATE 10 G PO PACK
10.0000 g | PACK | Freq: Once | ORAL | Status: AC
Start: 2023-12-08 — End: 2023-12-08
  Administered 2023-12-08: 10 g via ORAL
  Filled 2023-12-08: qty 1

## 2023-12-08 NOTE — Plan of Care (Signed)
  Problem: Fluid Volume: Goal: Ability to maintain a balanced intake and output will improve Outcome: Progressing   Problem: Nutritional: Goal: Maintenance of adequate nutrition will improve Outcome: Progressing Goal: Progress toward achieving an optimal weight will improve Outcome: Progressing

## 2023-12-08 NOTE — Inpatient Diabetes Management (Signed)
 Inpatient Diabetes Program Recommendations  AACE/ADA: New Consensus Statement on Inpatient Glycemic Control   Target Ranges:  Prepandial:   less than 140 mg/dL      Peak postprandial:   less than 180 mg/dL (1-2 hours)      Critically ill patients:  140 - 180 mg/dL    Latest Reference Range & Units 12/07/23 07:54 12/07/23 12:13 12/07/23 15:51 12/07/23 21:19 12/07/23 23:18 12/08/23 05:54  Glucose-Capillary 70 - 99 mg/dL 730 (H) 737 (H) 674 (H) 473 (H) 354 (H) 347 (H)   Review of Glycemic Control  Diabetes history: DM2 Outpatient Diabetes medications: Humulin  R U500 195 units with breakfast, Humulin  R U500 300 units in the evening, Humalog  60-95 unitsTID with meals, Dexcom G7 CGM Current orders for Inpatient glycemic control: Lantus  30 units at bedtime, Novolog  0-15 units Q6H  Inpatient Diabetes Program Recommendations:    Insulin : CBG 347 mg/dl this morning.  Please consider increasing Lantus  to 30 units BID (to start now), change Novolog  correction to 0-15 units AC&HS, and ordering Novolog  5 units TID with meals for meal coverage if patient eats at least 50% of meals.  Thanks, Earnie Gainer, RN, MSN, CDCES Diabetes Coordinator Inpatient Diabetes Program [phone] (Team Pager from 8am to 5pm)

## 2023-12-08 NOTE — Progress Notes (Signed)
 Heart Failure Navigator Progress Note  Assessed for Heart & Vascular TOC clinic readiness.  Patient does not meet criteria due to Advanced Heart Failure Team patient of Dr. Gala Romney. .   Navigator will sign off at this time.   Rhae Hammock, BSN, Scientist, clinical (histocompatibility and immunogenetics) Only

## 2023-12-08 NOTE — Progress Notes (Signed)
 PROGRESS NOTE  Jennifer [name] FMW:[id number] DOB: January 05, 1991 DOA: 12/07/2023 PCP: Emilio Joesph DEL, PA-C   LOS: 1 day   Brief Narrative / Interim history: 33 y.o. 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  She reports that this is typical for CHF exacerbations, she rarely gets fluid in her legs but mostly gets abdominal distention and discomfort.   Subjective / 24h Interval events: She complains of persistent abdominal distention and discomfort this morning, slightly improved but definitely not at baseline.  [name] also says that she did not make much urine last night.  Weight this morning looks slightly up with 1 pound since yesterday, she confirms that she was weighed standing up  Assesement and Plan: Principal problem Acute on chronic combined CHF -she does not seem to have had significant weight loss or urine output with 80 mg of Lasix  twice daily, increase to 120 today.  Most recent 2D echo done May 2025 shows LVEF 45-50%, global hypokinesis, RV was normal, RV size was normal and there was normal PA pressure -Received metolazone  yesterday, repeated dose today -Strict ins and outs, daily weights   Active problems Hypertensive urgency-were started on Coreg , hydralazine , furosemide .  Blood pressure now much better   Type 2 diabetes mellitus -poorly controlled, with hyperglycemia.  [name] on very high doses of insulin  at home - Remains hyperglycemic, increase Lantus , mealtime, continue to monitor CBGs   Hypothyroidism -most recent 3 TSH is earlier this year quite abnormal, repeat another TSH pending this morning   Chronic kidney disease stage V -baseline creatinine ranging anywhere between 1.7 and  2.3 at least over the last few months, better than baseline on admission, repeat renal function this morning pending   Hyponatremia -watch sodium with diuresis, but she is also hyperglycemic so there is a component of pseudohyponatremia.  Repeat sodium this morning pending   Thrombocytopenia-has been thrombocytopenia before, no bleeding, monitor, improved today   OSA-placed on CPAP   Obesity, class I-BMI 31, will have to re-calculate once she gets diuresed   Hyperlipidemia-continue home medications   Depression-continue home medications  Scheduled Meds:  aspirin  EC  81 mg Oral Daily   carvedilol   9.375 mg Oral BID   dicyclomine   20 mg Oral BID   DULoxetine   60 mg Oral QHS   enoxaparin  (LOVENOX ) injection  30 mg Subcutaneous Q24H   fenofibrate   54 mg Oral Daily   furosemide   80 mg Intravenous Q12H   hydrALAZINE   50 mg Oral Q8H   insulin  aspart  0-15 Units Subcutaneous Q6H   insulin  aspart  5 Units Subcutaneous TID WC   insulin  glargine  30 Units Subcutaneous BID   isosorbide  mononitrate  30 mg Oral Daily   ivabradine   7.5 mg Oral BID WC   levothyroxine   150 mcg Oral Q0600   metolazone   5 mg Oral Once   pregabalin   300 mg Oral QHS   rosuvastatin   10 mg Oral Daily   Continuous Infusions: PRN Meds:.acetaminophen , labetalol   Current Outpatient Medications  Medication Instructions   aspirin  EC 81 mg, Oral, Daily, Swallow whole.   azelastine  (ASTELIN ) 0.1 % nasal spray 2 sprays, Daily PRN   BAQSIMI ONE PACK 3 MG/DOSE POWD 1 Dose, Once PRN   calcitRIOL  (ROCALTROL ) 0.25 mcg, Daily  carvedilol  (COREG ) 9.375 mg, Oral, 2 times daily   Continuous Glucose Sensor (DEXCOM G7 SENSOR) MISC 1 Device, See admin instructions   CREON  36000-114000 units CPEP capsule 36,000 Units, Oral, 3 times daily   dicyclomine  (BENTYL ) 20 mg, Oral, 2 times daily   DULoxetine  (CYMBALTA ) 60 mg, Daily at bedtime   ergocalciferol  (VITAMIN D2) 50,000 Units, Oral, Every Sun   fenofibrate  54 mg, Oral, Daily    Glucose Blood (BLOOD GLUCOSE TEST STRIPS) STRP 1 each, Does not apply, 3 times daily, Use as directed to check blood sugar. May dispense any manufacturer covered by patient's insurance and fits patient's device.   HumaLOG  KwikPen 60-95 Units, See admin instructions   HumuLIN  R U-500 KwikPen 195-300 Units, See admin instructions   hydrALAZINE  (APRESOLINE ) 50 mg, Daily   Insulin  Pen Needle (PEN NEEDLES) 31G X 5 MM MISC Use as directed 3 (three) times daily.   isosorbide  mononitrate (IMDUR ) 30 mg, Oral, Daily   ivabradine  (CORLANOR ) 7.5 mg, Oral, 2 times daily with meals   Lancet Device MISC 1 each, Does not apply, 3 times daily, May dispense any manufacturer covered by patient's insurance.   Lancets MISC 1 each, Does not apply, 3 times daily, Use as directed to check blood sugar. May dispense any manufacturer covered by patient's insurance and fits patient's device.   lansoprazole  (PREVACID ) 30 mg, Oral, 2 times daily before meals   levocetirizine (XYZAL ) 5 mg, Daily at bedtime   levothyroxine  (SYNTHROID ) 150 mcg, Daily before breakfast   metolazone  (ZAROXOLYN ) 2.5 mg, Oral, Daily PRN   oxyCODONE  (ROXICODONE ) 5 mg, Oral, Every 4 hours PRN   pregabalin  (LYRICA ) 300 mg, Daily at bedtime   prochlorperazine  (COMPAZINE ) 10 mg, Daily at bedtime   promethazine  (PHENERGAN ) 12.5 mg, Oral, Every 12 hours PRN   rosuvastatin  (CRESTOR ) 10 mg, Oral, Daily   Safety Seal Miscellaneous MISC Hormonic Hair Solution with minoxidil USP 7% and finasteride USP 0.05% - apply to affected areas daily every morning.   torsemide  (DEMADEX ) 60 mg, Oral, Daily   tretinoin  (RETIN-A ) 0.05 % cream 1 Application, Daily at bedtime   TYLENOL  500-1,000 mg, Every 6 hours PRN    Diet Orders (From admission, onward)     Start     Ordered   12/07/23 2323  Diet heart healthy/carb modified Room service appropriate? Yes; Fluid consistency: Thin; Fluid restriction: 2000 mL Fluid  Diet effective now       Comments: Salt restriction 2  g/day  Question Answer Comment  Diet-HS Snack? Nothing   Room service appropriate? Yes   Fluid consistency: Thin   Fluid restriction: 2000 mL Fluid      12/07/23 2323            DVT prophylaxis: enoxaparin  (LOVENOX ) injection 30 mg Start: 12/07/23 1700   Lab Results  Component Value Date   PLT 148 (L) 12/08/2023      Code Status: Full Code  Family Communication: No family at bedside  Status is: Inpatient Remains inpatient appropriate because: Remains fluid overloaded   Level of care: Progressive  Consultants:  None  Objective: Vitals:   12/08/23 0015 12/08/23 0407 12/08/23 0427 12/08/23 0750  BP: 131/77 (!) 137/92  (!) 144/95  Pulse: (!) 101 94    Resp: 19 20  19   Temp: 97.8 F (36.6 C) 97.7 F (36.5 C)  97.7 F (36.5 C)  TempSrc: Axillary Axillary  Oral  SpO2: 97% 99%  96%  Weight: 66.3 kg  66.3 kg   Height:  Intake/Output Summary (Last 24 hours) at 12/08/2023 0831 Last data filed at 12/08/2023 0739 Gross per 24 hour  Intake 240 ml  Output 1400 ml  Net -1160 ml   Wt Readings from Last 3 Encounters:  12/08/23 66.3 kg  11/10/23 65.8 kg  11/01/23 65.8 kg    Examination:  Constitutional: NAD Eyes: no scleral icterus ENMT: Mucous membranes are moist.  Neck: normal, supple Respiratory: clear to auscultation bilaterally, no wheezing, no crackles.  Cardiovascular: Regular rate and rhythm, no murmurs / rubs / gallops. No LE edema.  Abdomen: Abdomen distended, bowel sounds positive Musculoskeletal: no clubbing / cyanosis.    Data Reviewed: I have independently reviewed following labs and imaging studies   CBC Recent Labs  Lab 12/07/23 0018 12/08/23 0245  WBC 5.9 7.6  HGB 10.8* 13.5  HCT 31.5* 35.6*  PLT 113* 148*  MCV 89.2 90.6  MCH 30.6 32.3  MCHC 34.3 35.7  RDW 15.9* 15.9*    Recent Labs  Lab 12/07/23 0018  NA 127*  K 4.2  CL 88*  CO2 21*  GLUCOSE 218*  BUN 42*  CREATININE 1.63*  CALCIUM  9.3  AST LIPEMIC SPECIMEN,  RESULTS MAY BE AFFECTED.  ALT LIPEMIC SPECIMEN, RESULTS MAY BE AFFECTED.  ALKPHOS 178*  BILITOT 0.5  ALBUMIN  4.1    ------------------------------------------------------------------------------------------------------------------ No results for input(s): CHOL, HDL, LDLCALC, TRIG, CHOLHDL, LDLDIRECT in the last 72 hours.  Lab Results  Component Value Date   HGBA1C 8.8 (H) 09/28/2023   ------------------------------------------------------------------------------------------------------------------ No results for input(s): TSH, T4TOTAL, T3FREE, THYROIDAB in the last 72 hours.  Invalid input(s): FREET3  Cardiac Enzymes No results for input(s): CKMB, TROPONINI, MYOGLOBIN in the last 168 hours.  Invalid input(s): CK ------------------------------------------------------------------------------------------------------------------    Component Value Date/Time   BNP 20.9 11/10/2023 1627   BNP 27.7 11/10/2023 1238    CBG: Recent Labs  Lab 12/07/23 1213 12/07/23 1551 12/07/23 2119 12/07/23 2318 12/08/23 0554  GLUCAP 262* 325* 473* 354* 347*    No results found for this or any previous visit (from the past 240 hours).   Radiology Studies: No results found.   Nilda Fendt, MD, PhD [facility]  Between 7 am - 7 pm I am available, please contact me via Amion (for emergencies) or Securechat (non urgent messages)  Between 7 pm - 7 am I am not available, please contact night coverage MD/APP via Amion

## 2023-12-09 DIAGNOSIS — I5043 Acute on chronic combined systolic (congestive) and diastolic (congestive) heart failure: Secondary | ICD-10-CM | POA: Diagnosis not present

## 2023-12-09 LAB — GLUCOSE, CAPILLARY
Glucose-Capillary: 364 mg/dL — ABNORMAL HIGH (ref 70–99)
Glucose-Capillary: 278 mg/dL — ABNORMAL HIGH (ref 70–99)
Glucose-Capillary: 214 mg/dL — ABNORMAL HIGH (ref 70–99)
Glucose-Capillary: 284 mg/dL — ABNORMAL HIGH (ref 70–99)

## 2023-12-09 LAB — BASIC METABOLIC PANEL WITH GFR
Anion gap: 17 — ABNORMAL HIGH (ref 5–15)
Anion gap: 18 — ABNORMAL HIGH (ref 5–15)
BUN: 99 mg/dL — ABNORMAL HIGH (ref 6–20)
BUN: 97 mg/dL — ABNORMAL HIGH (ref 6–20)
CO2: 19 mmol/L — ABNORMAL LOW (ref 22–32)
CO2: 18 mmol/L — ABNORMAL LOW (ref 22–32)
Calcium: 7.9 mg/dL — ABNORMAL LOW (ref 8.9–10.3)
Calcium: 8 mg/dL — ABNORMAL LOW (ref 8.9–10.3)
Chloride: 85 mmol/L — ABNORMAL LOW (ref 98–111)
Chloride: 78 mmol/L — ABNORMAL LOW (ref 98–111)
Creatinine, Ser: 3.15 mg/dL — ABNORMAL HIGH (ref 0.44–1.00)
Creatinine, Ser: 2.61 mg/dL — ABNORMAL HIGH (ref 0.44–1.00)
GFR, Estimated: 19 mL/min — ABNORMAL LOW (ref >60)
GFR, Estimated: 24 mL/min — ABNORMAL LOW (ref >60)
Glucose, Bld: 292 mg/dL — ABNORMAL HIGH (ref 70–99)
Glucose, Bld: 321 mg/dL — ABNORMAL HIGH (ref 70–99)
Potassium: >7.5 mmol/L (ref 3.5–5.1)
Potassium: >7.5 mmol/L (ref 3.5–5.1)
Sodium: 121 mmol/L — ABNORMAL LOW (ref 135–145)
Sodium: 114 mmol/L — CL (ref 135–145)

## 2023-12-09 LAB — CBC
HCT: 32.2 % — ABNORMAL LOW (ref 36.0–46.0)
Hemoglobin: 11.8 g/dL — ABNORMAL LOW (ref 12.0–15.0)
MCH: 33.1 pg (ref 26.0–34.0)
MCHC: 36.6 g/dL — ABNORMAL HIGH (ref 30.0–36.0)
MCV: 90.2 fL (ref 80.0–100.0)
Platelets: 141 K/uL — ABNORMAL LOW (ref 150–400)
RBC: 3.57 MIL/uL — ABNORMAL LOW (ref 3.87–5.11)
RDW: 15.7 % — ABNORMAL HIGH (ref 11.5–15.5)
WBC: 7.7 K/uL (ref 4.0–10.5)
nRBC: 0 % (ref 0.0–0.2)

## 2023-12-09 LAB — MAGNESIUM: Magnesium: 2.4 mg/dL (ref 1.7–2.4)

## 2023-12-09 MED ORDER — CALCIUM GLUCONATE-NACL 1-0.675 GM/50ML-% IV SOLN
1.0000 g | Freq: Once | INTRAVENOUS | Status: AC
  Administered 2023-12-09: 1000 mg via INTRAVENOUS
  Filled 2023-12-09: qty 50

## 2023-12-09 NOTE — Plan of Care (Signed)

## 2023-12-09 NOTE — Progress Notes (Signed)
 PT placed on CPAP at this time.   12/09/23 2304  BiPAP/CPAP/SIPAP  BiPAP/CPAP/SIPAP Pt Type Adult  BiPAP/CPAP/SIPAP Resmed  Mask Type Full face mask  Dentures removed? Not applicable  Mask Size Medium  Respiratory Rate 16 breaths/min  FiO2 (%) 21 %  Flow Rate 0 lpm  Patient Home Machine No  Patient Home Mask No  Patient Home Tubing No  Auto Titrate Yes  Nasal massage performed Yes  CPAP/SIPAP surface wiped down Yes  Device Plugged into RED Power Outlet Yes

## 2023-12-09 NOTE — Plan of Care (Signed)
  Problem: Tissue Perfusion: Goal: Adequacy of tissue perfusion will improve Outcome: Progressing   Problem: Clinical Measurements: Goal: Will remain free from infection Outcome: Progressing Goal: Respiratory complications will improve Outcome: Progressing Goal: Cardiovascular complication will be avoided Outcome: Progressing   Problem: Activity: Goal: Risk for activity intolerance will decrease Outcome: Progressing   Problem: Elimination: Goal: Will not experience complications related to bowel motility Outcome: Progressing Goal: Will not experience complications related to urinary retention Outcome: Progressing   Problem: Safety: Goal: Ability to remain free from injury will improve Outcome: Progressing

## 2023-12-09 NOTE — Progress Notes (Signed)
 PROGRESS NOTE  Jennifer [name] FMW:[id number] DOB: 03-10-1991 DOA: 12/07/2023 PCP: Emilio Joesph DEL, PA-C   LOS: 2 days   Brief Narrative / Interim history: 33 y.o. 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  She reports that this is typical for CHF exacerbations, she rarely gets fluid in her legs but mostly gets abdominal distention and discomfort.   Subjective / 24h Interval events: The patient put out 2300 in urine over the past 24 hours. This is about 100 cc more than she took in. [name] concerned that she isn't putting out much urine, but in fact she is putting out plenty of urine. Her intake was about 2200. She does complain that her abdomen feels bigger.  Assesement and Plan: Principal problem Acute on chronic combined CHF -she does not seem to have had significant weight loss or urine output with 80 mg of Lasix  twice daily, increase to 120 today.  Most recent 2D echo done May 2025 shows LVEF 45-50%, global hypokinesis, RV was normal, RV size was normal and there was normal PA pressure -Received metolazone  yesterday, repeated dose today as well. -Continue lasix  drip.  -Strict ins and outs, daily weights   Active problems Hypertensive urgency-were started on Coreg , hydralazine , furosemide .  Blood pressure now on the low side. Will stop hydralazine .   Type 2 diabetes mellitus -poorly controlled, with hyperglycemia.  [name] on very high doses of insulin  at home - Remains hyperglycemic, increase Lantus  to 40 units, mealtime, continue to monitor CBGs - Glucoses are funning 242-470 in the last 24 hours.    Hypothyroidism -TSH is normal.   Chronic kidney disease stage V -baseline creatinine ranging  anywhere between 1.7 and 2.3 at least over the last few months, better than baseline on admission, repeat renal function this morning pending   Hyponatremia -watch sodium with diuresis, but she is also hyperglycemic so there is a component of pseudohyponatremia.  Repeat sodium this morning pending.   Thrombocytopenia-has been thrombocytopenia before, no bleeding, monitor, improved today   OSA-placed on CPAP   Obesity, class I-BMI 31, will have to re-calculate once she gets diuresed   Hyperlipidemia-Continue Crestor  10 mg daily as at home. Apparently this is causing an issue with lab draws.   Depression-continue home medications  Scheduled Meds:  aspirin  EC  81 mg Oral Daily   carvedilol   9.375 mg Oral BID   dicyclomine   20 mg Oral BID   DULoxetine   60 mg Oral QHS   enoxaparin  (LOVENOX ) injection  30 mg Subcutaneous Q24H   famotidine   20 mg Oral BID   fenofibrate   54 mg Oral Daily   hydrALAZINE   50 mg Oral Q8H   insulin  aspart  0-15 Units Subcutaneous TID WC   insulin  aspart  15 Units Subcutaneous TID WC   insulin  glargine  30 Units Subcutaneous BID   isosorbide  mononitrate  30 mg Oral Daily   ivabradine   7.5 mg Oral BID WC   levothyroxine   150 mcg Oral Q0600   pregabalin   300 mg Oral QHS   rosuvastatin   10 mg Oral Daily   Continuous Infusions:  furosemide  120 mg (12/09/23 0435)   PRN Meds:.acetaminophen , labetalol   Current Outpatient Medications  Medication Instructions   aspirin  EC 81 mg, Oral, Daily, Swallow whole.  azelastine  (ASTELIN ) 0.1 % nasal spray 2 sprays, Daily PRN   BAQSIMI ONE PACK 3 MG/DOSE POWD 1 Dose, Once PRN   calcitRIOL  (ROCALTROL ) 0.25 mcg, Daily   carvedilol  (COREG ) 9.375 mg, Oral, 2 times daily   Continuous Glucose Sensor (DEXCOM G7 SENSOR) MISC 1 Device, See admin instructions   CREON  36000-114000 units CPEP capsule 36,000 Units, Oral, 3 times daily   dicyclomine  (BENTYL ) 20 mg, Oral, 2 times daily   DULoxetine  (CYMBALTA ) 60 mg, Daily at bedtime    ergocalciferol  (VITAMIN D2) 50,000 Units, Oral, Every Sun   fenofibrate  54 mg, Oral, Daily   Glucose Blood (BLOOD GLUCOSE TEST STRIPS) STRP 1 each, Does not apply, 3 times daily, Use as directed to check blood sugar. May dispense any manufacturer covered by patient's insurance and fits patient's device.   HumaLOG  KwikPen 60-95 Units, See admin instructions   HumuLIN  R U-500 KwikPen 195-300 Units, See admin instructions   hydrALAZINE  (APRESOLINE ) 50 mg, Daily   Insulin  Pen Needle (PEN NEEDLES) 31G X 5 MM MISC Use as directed 3 (three) times daily.   isosorbide  mononitrate (IMDUR ) 30 mg, Oral, Daily   ivabradine  (CORLANOR ) 7.5 mg, Oral, 2 times daily with meals   Lancet Device MISC 1 each, Does not apply, 3 times daily, May dispense any manufacturer covered by patient's insurance.   Lancets MISC 1 each, Does not apply, 3 times daily, Use as directed to check blood sugar. May dispense any manufacturer covered by patient's insurance and fits patient's device.   lansoprazole  (PREVACID ) 30 mg, Oral, 2 times daily before meals   levocetirizine (XYZAL ) 5 mg, Daily at bedtime   levothyroxine  (SYNTHROID ) 150 mcg, Daily before breakfast   metolazone  (ZAROXOLYN ) 2.5 mg, Oral, Daily PRN   oxyCODONE  (ROXICODONE ) 5 mg, Oral, Every 4 hours PRN   pregabalin  (LYRICA ) 300 mg, Daily at bedtime   prochlorperazine  (COMPAZINE ) 10 mg, Daily at bedtime   promethazine  (PHENERGAN ) 12.5 mg, Oral, Every 12 hours PRN   rosuvastatin  (CRESTOR ) 10 mg, Oral, Daily   Safety Seal Miscellaneous MISC Hormonic Hair Solution with minoxidil USP 7% and finasteride USP 0.05% - apply to affected areas daily every morning.   torsemide  (DEMADEX ) 60 mg, Oral, Daily   tretinoin  (RETIN-A ) 0.05 % cream 1 Application, Daily at bedtime   TYLENOL  500-1,000 mg, Every 6 hours PRN    Diet Orders (From admission, onward)     Start     Ordered   12/07/23 2323  Diet heart healthy/carb modified Room service appropriate? Yes; Fluid consistency:  Thin; Fluid restriction: 2000 mL Fluid  Diet effective now       Comments: Salt restriction 2 g/day  Question Answer Comment  Diet-HS Snack? Nothing   Room service appropriate? Yes   Fluid consistency: Thin   Fluid restriction: 2000 mL Fluid      12/07/23 2323            DVT prophylaxis: enoxaparin  (LOVENOX ) injection 30 mg Start: 12/07/23 1700   Lab Results  Component Value Date   PLT 141 (L) 12/09/2023      Code Status: Full Code  Family Communication: No family at bedside  Status is: Inpatient Remains inpatient appropriate because: Remains fluid overloaded   Level of care: Progressive  Consultants:  None  Objective: Vitals:   12/09/23 0553 12/09/23 0814 12/09/23 1355 12/09/23 1356  BP: 126/82 118/65 (!) 90/53 (!) 100/49  Pulse:  79 70 71  Resp:  20 18 18   Temp:  (!) 96.3 F (35.7 C)  97.9 F (36.6 C)  TempSrc:  Oral  Oral  SpO2:  98% 98%   Weight:      Height:        Intake/Output Summary (Last 24 hours) at 12/09/2023 1432 Last data filed at 12/09/2023 0700 Gross per 24 hour  Intake 1234.35 ml  Output 900 ml  Net 334.35 ml   Wt Readings from Last 3 Encounters:  12/09/23 66.4 kg  11/10/23 65.8 kg  11/01/23 65.8 kg    Examination:  Exam:  Constitutional:  The patient is awake, alert, and oriented x 3. No acute distress. Respiratory:  No increased work of breathing. No wheezes, rales, or rhonchi No tactile fremitus Cardiovascular:  Regular rate and rhythm No murmurs, ectopy, or gallups. No lateral PMI. No thrills. Abdomen:  Abdomen is distended by soft.  Morbidly obese Unable to evaluate for fluid wave. No hernias, masses, or organomegaly Normoactive bowel sounds, but distant. Musculoskeletal:  No cyanosis, clubbing, or edema Skin:  No rashes, lesions, ulcers palpation of skin: no induration or nodules Neurologic:  CN 2-12 intact Sensation all 4 extremities intact Psychiatric:  Mental status Mood, affect  appropriate Orientation to person, place, time  judgment and insight appear intact    Data Reviewed: I have independently reviewed following labs and imaging studies   CBC Recent Labs  Lab 12/07/23 0018 12/08/23 0245 12/09/23 0250  WBC 5.9 7.6 7.7  HGB 10.8* 13.5 11.8*  HCT 31.5* 35.6* 32.2*  PLT 113* 148* 141*  MCV 89.2 90.6 90.2  MCH 30.6 32.3 33.1  MCHC 34.3 35.7 36.6*  RDW 15.9* 15.9* 15.7*    Recent Labs  Lab 12/07/23 0018 12/08/23 1112 12/08/23 1632  NA 127* 127*  --   K 4.2 5.7*  --   CL 88* 88*  --   CO2 21* 16*  --   GLUCOSE 218* 463*  --   BUN 42* 76*  --   CREATININE 1.63* 2.40*  --   CALCIUM  9.3 9.1  --   AST LIPEMIC SPECIMEN, RESULTS MAY BE AFFECTED. 105*  --   ALT LIPEMIC SPECIMEN, RESULTS MAY BE AFFECTED. 152*  --   ALKPHOS 178* 147*  --   BILITOT 0.5 1.1  --   ALBUMIN  4.1 4.2  --   MG  --   --  2.8*  TSH  --  3.174  --     ------------------------------------------------------------------------------------------------------------------ No results for input(s): CHOL, HDL, LDLCALC, TRIG, CHOLHDL, LDLDIRECT in the last 72 hours.  Lab Results  Component Value Date   HGBA1C 8.8 (H) 09/28/2023   ------------------------------------------------------------------------------------------------------------------ Recent Labs    12/08/23 1112  TSH 3.174    Cardiac Enzymes No results for input(s): CKMB, TROPONINI, MYOGLOBIN in the last 168 hours.  Invalid input(s): CK ------------------------------------------------------------------------------------------------------------------    Component Value Date/Time   BNP 20.9 11/10/2023 1627   BNP 27.7 11/10/2023 1238    CBG: Recent Labs  Lab 12/08/23 1605 12/08/23 1826 12/08/23 2145 12/09/23 0629 12/09/23 1109  GLUCAP 290* 362* 242* 364* 278*    No results found for this or any previous visit (from the past 240 hours).   Radiology Studies: No results  found.   Jillayne Witte, DO [facility] 12/09/2023 14:41  Between 7 am - 7 pm I am available, please contact me via Amion (for emergencies) or Securechat (non urgent messages)  Between 7 pm - 7 am I am not available, please contact night coverage MD/APP via Amion

## 2023-12-09 NOTE — Progress Notes (Signed)
   12/09/23 2302  Assess: MEWS Score  ECG Heart Rate 76  Assess: MEWS Score  MEWS Temp 0  MEWS Systolic 0  MEWS Pulse 0  MEWS RR 0  MEWS LOC 0  MEWS Score 0  MEWS Score Color Green  Provider Notification  Provider Name/Title Dr. Lee  Date Provider Notified 12/09/23 (2302)  Time Provider Notified 2302  Method of Notification Page  Notification Reason Critical Result (Na- 114, K- greater than 7.5;  blood sample hemolyzed)  Provider response See new orders;Other (Comment) (monitor for arrythmias;  for abdominal UTZ)  Date of Provider Response 12/09/23  Time of Provider Response 2303  Assess: SIRS CRITERIA  SIRS Temperature  0  SIRS Respirations  0  SIRS Pulse 0  SIRS WBC 0  SIRS Score Sum  0

## 2023-12-09 NOTE — Progress Notes (Signed)
   12/09/23 2016  Provider Notification  Provider Name/Title Dr. Lee  Date Provider Notified 12/09/23  Time Provider Notified 2016  Method of Notification Page  Notification Reason Critical Result (Potassium level greater than 7.5)  Provider response See new orders;Other (Comment) (give due Calcium  gluconate repeat BMP at 2200H)  Date of Provider Response 12/09/23  Time of Provider Response 2017

## 2023-12-10 ENCOUNTER — Inpatient Hospital Stay (HOSPITAL_COMMUNITY)

## 2023-12-10 DIAGNOSIS — I5043 Acute on chronic combined systolic (congestive) and diastolic (congestive) heart failure: Secondary | ICD-10-CM | POA: Diagnosis not present

## 2023-12-10 LAB — COMPREHENSIVE METABOLIC PANEL WITH GFR
ALT: 116 U/L — ABNORMAL HIGH (ref 0–44)
ALT: 123 U/L — ABNORMAL HIGH (ref 0–44)
AST: 137 U/L — ABNORMAL HIGH (ref 15–41)
AST: 183 U/L — ABNORMAL HIGH (ref 15–41)
Albumin: 3.6 g/dL (ref 3.5–5.0)
Albumin: 3.6 g/dL (ref 3.5–5.0)
Alkaline Phosphatase: 120 U/L (ref 38–126)
Alkaline Phosphatase: 119 U/L (ref 38–126)
Anion gap: 15 (ref 5–15)
Anion gap: 17 — ABNORMAL HIGH (ref 5–15)
BUN: 91 mg/dL — ABNORMAL HIGH (ref 6–20)
BUN: 97 mg/dL — ABNORMAL HIGH (ref 6–20)
CO2: 21 mmol/L — ABNORMAL LOW (ref 22–32)
CO2: 20 mmol/L — ABNORMAL LOW (ref 22–32)
Calcium: 7.7 mg/dL — ABNORMAL LOW (ref 8.9–10.3)
Calcium: 8 mg/dL — ABNORMAL LOW (ref 8.9–10.3)
Chloride: 79 mmol/L — ABNORMAL LOW (ref 98–111)
Chloride: 78 mmol/L — ABNORMAL LOW (ref 98–111)
Creatinine, Ser: 2.57 mg/dL — ABNORMAL HIGH (ref 0.44–1.00)
Creatinine, Ser: 2.48 mg/dL — ABNORMAL HIGH (ref 0.44–1.00)
GFR, Estimated: 25 mL/min — ABNORMAL LOW (ref >60)
GFR, Estimated: 26 mL/min — ABNORMAL LOW (ref >60)
Glucose, Bld: 269 mg/dL — ABNORMAL HIGH (ref 70–99)
Glucose, Bld: 289 mg/dL — ABNORMAL HIGH (ref 70–99)
Potassium: 6.6 mmol/L (ref 3.5–5.1)
Potassium: >7.5 mmol/L (ref 3.5–5.1)
Sodium: 115 mmol/L — CL (ref 135–145)
Sodium: 115 mmol/L — CL (ref 135–145)
Total Bilirubin: 3.1 mg/dL — ABNORMAL HIGH (ref 0.0–1.2)
Total Bilirubin: 5.6 mg/dL — ABNORMAL HIGH (ref 0.0–1.2)

## 2023-12-10 LAB — GLUCOSE, CAPILLARY
Glucose-Capillary: 318 mg/dL — ABNORMAL HIGH (ref 70–99)
Glucose-Capillary: 358 mg/dL — ABNORMAL HIGH (ref 70–99)
Glucose-Capillary: 124 mg/dL — ABNORMAL HIGH (ref 70–99)
Glucose-Capillary: 264 mg/dL — ABNORMAL HIGH (ref 70–99)

## 2023-12-10 LAB — CBC WITH DIFFERENTIAL/PLATELET
Abs Immature Granulocytes: 0.04 K/uL (ref 0.00–0.07)
Basophils Absolute: 0.1 K/uL (ref 0.0–0.1)
Basophils Relative: 1 %
Eosinophils Absolute: 0.1 K/uL (ref 0.0–0.5)
Eosinophils Relative: 1 %
HCT: 32.1 % — ABNORMAL LOW (ref 36.0–46.0)
Hemoglobin: 12.7 g/dL (ref 12.0–15.0)
Immature Granulocytes: 1 %
Lymphocytes Relative: 45 %
Lymphs Abs: 3.4 K/uL (ref 0.7–4.0)
MCH: 30.4 pg (ref 26.0–34.0)
MCHC: 34.1 g/dL (ref 30.0–36.0)
MCV: 89.3 fL (ref 80.0–100.0)
Monocytes Absolute: 0.5 K/uL (ref 0.1–1.0)
Monocytes Relative: 6 %
Neutro Abs: 3.5 K/uL (ref 1.7–7.7)
Neutrophils Relative %: 46 %
Platelets: 191 K/uL (ref 150–400)
RBC: 3.61 MIL/uL — ABNORMAL LOW (ref 3.87–5.11)
RDW: 17.2 % — ABNORMAL HIGH (ref 11.5–15.5)
WBC: 7.7 K/uL (ref 4.0–10.5)
nRBC: 0 % (ref 0.0–0.2)

## 2023-12-10 MED ORDER — CALCIUM GLUCONATE-NACL 1-0.675 GM/50ML-% IV SOLN
1.0000 g | Freq: Once | INTRAVENOUS | Status: AC
  Administered 2023-12-10: 1000 mg via INTRAVENOUS
  Filled 2023-12-10: qty 50

## 2023-12-10 MED ORDER — SODIUM ZIRCONIUM CYCLOSILICATE 10 G PO PACK
10.0000 g | PACK | Freq: Two times a day (BID) | ORAL | Status: DC
Start: 2023-12-10 — End: 2023-12-10

## 2023-12-10 MED ORDER — SODIUM CHLORIDE 0.9 % IV SOLN: INTRAVENOUS | Status: DC

## 2023-12-10 MED ORDER — SODIUM ZIRCONIUM CYCLOSILICATE 10 G PO PACK
10.0000 g | PACK | Freq: Once | ORAL | Status: AC
  Administered 2023-12-10: 10 g via ORAL
  Filled 2023-12-10: qty 1

## 2023-12-10 NOTE — Plan of Care (Signed)
 BMP obtained total twice before midnight which showed elevated potassium 6.6 and second on 7.5.   -Hemolyzed BMP sample which is showing falsely elevated potassium.  No need for treatment for a falsely elevated potassium.

## 2023-12-10 NOTE — Progress Notes (Signed)
 PROGRESS NOTE  Jennifer [name] FMW:[id number] DOB: 1990/11/12 DOA: 12/07/2023 PCP: Emilio Joesph DEL, PA-C   LOS: 3 days   Brief Narrative / Interim history: 33 y.o. 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  She reports that this is typical for CHF exacerbations, she rarely gets fluid in her legs but mostly gets abdominal distention and discomfort.   Subjective / 24h Interval events: Abdominal ultrasound was completed and does not demonstrate any ascites or cause for for discomfort. The patient had sufficient urinary output on 12/09/2023. The patient had chemistry drawn on the afternoon of 12/09/2023 to follow her electrolytes. It was hemolyzed and demonstrated K of 6.6 and Sodium of 115. The patient states that her sodium is always low due to her triglyceridemia. The repeat draw was also hemolyzed and demonstrated a potassium greater than 7.5. It has been drawn 3 times since then and was hemolyzed again on both occasions. Her potassium has continue to be returned greater than 7.5. This morning the patient was given one dose of lokelma  and an amp of calcium  gluconate. EKG was checked and did not demonstrate any widened QRS's or peaked T's.   Assesement and Plan: Principal problem Acute on chronic combined CHF -she does not seem to have had significant weight loss or urine output with 80 mg of Lasix  twice daily, increase to 120 today.  Most recent 2D echo done May 2025 shows LVEF 45-50%, global hypokinesis, RV was normal, RV size was normal and there was normal PA pressure -Received metolazone  yesterday, repeated dose today as well. -Continue lasix  drip.  -Strict ins and outs, daily weights   Active  problems Hypertensive urgency-were started on Coreg , hydralazine , furosemide .  Blood pressure now on the low side. Will stop hydralazine .   Type 2 diabetes mellitus -poorly controlled, with hyperglycemia.  [name] on very high doses of insulin  at home - Remains hyperglycemic, increase Lantus  to 40 units, mealtime, continue to monitor CBGs - Glucoses are funning 242-470 in the last 24 hours.    Hypothyroidism -TSH is normal.   Chronic kidney disease stage V -baseline creatinine ranging anywhere between 1.7 and 2.3 at least over the last few months, better than baseline on admission, repeat renal function this morning pending.  Hyperkalemia - due to hemolyzed samples. The chemistry has been drawn multiple times since yesterday even with instructions to take the sample immediately to the lab to avoid hemolysis. They have all been hemolyzed. The last 4 samples have all had potassium greater than 7.5. I have called lab and discussed with colleagues. I do not know of a way to get an accurate measure of her potassium. For this reason I have checked an EKG and ensured that it does not demonstrate peaked T's or widened QRS's. This morning she was given 1 amp of calcium  gluconate and 20 gm of lokelma  to address hyperkalemia. Will not order another chemistry until tomorrow am.    Hyponatremia -115. The patient has a history of hyponatremia due to her hypertriglyceridemia. Will continue IV fluids. Measure again in the morning.  Thrombocytopenia-has been thrombocytopenia before, no bleeding, monitor, improved today   OSA-placed on CPAP   Obesity, class I-BMI 31, will have to re-calculate once she gets diuresed   Hyperlipidemia-Continue Crestor  10 mg daily as at  home. Apparently this is causing an issue with lab draws.   Depression-continue home medications  Scheduled Meds:  aspirin  EC  81 mg Oral Daily   carvedilol   9.375 mg Oral BID   dicyclomine   20 mg Oral BID   DULoxetine   60 mg Oral QHS    enoxaparin  (LOVENOX ) injection  30 mg Subcutaneous Q24H   famotidine   20 mg Oral BID   fenofibrate   54 mg Oral Daily   insulin  aspart  0-15 Units Subcutaneous TID WC   insulin  aspart  15 Units Subcutaneous TID WC   insulin  glargine  30 Units Subcutaneous BID   isosorbide  mononitrate  30 mg Oral Daily   ivabradine   7.5 mg Oral BID WC   levothyroxine   150 mcg Oral Q0600   pregabalin   300 mg Oral QHS   rosuvastatin   10 mg Oral Daily   Continuous Infusions:  sodium chloride  100 mL/hr at 12/10/23 9076   furosemide  120 mg (12/10/23 0451)   PRN Meds:.acetaminophen , labetalol   Current Outpatient Medications  Medication Instructions   aspirin  EC 81 mg, Oral, Daily, Swallow whole.   azelastine  (ASTELIN ) 0.1 % nasal spray 2 sprays, Daily PRN   BAQSIMI ONE PACK 3 MG/DOSE POWD 1 Dose, Once PRN   calcitRIOL  (ROCALTROL ) 0.25 mcg, Daily   carvedilol  (COREG ) 9.375 mg, Oral, 2 times daily   Continuous Glucose Sensor (DEXCOM G7 SENSOR) MISC 1 Device, See admin instructions   CREON  36000-114000 units CPEP capsule 36,000 Units, Oral, 3 times daily   dicyclomine  (BENTYL ) 20 mg, Oral, 2 times daily   DULoxetine  (CYMBALTA ) 60 mg, Daily at bedtime   ergocalciferol  (VITAMIN D2) 50,000 Units, Oral, Every Sun   fenofibrate  54 mg, Oral, Daily   Glucose Blood (BLOOD GLUCOSE TEST STRIPS) STRP 1 each, Does not apply, 3 times daily, Use as directed to check blood sugar. May dispense any manufacturer covered by patient's insurance and fits patient's device.   HumaLOG  KwikPen 60-95 Units, See admin instructions   HumuLIN  R U-500 KwikPen 195-300 Units, See admin instructions   hydrALAZINE  (APRESOLINE ) 50 mg, Daily   Insulin  Pen Needle (PEN NEEDLES) 31G X 5 MM MISC Use as directed 3 (three) times daily.   isosorbide  mononitrate (IMDUR ) 30 mg, Oral, Daily   ivabradine  (CORLANOR ) 7.5 mg, Oral, 2 times daily with meals   Lancet Device MISC 1 each, Does not apply, 3 times daily, May dispense any manufacturer covered by  patient's insurance.   Lancets MISC 1 each, Does not apply, 3 times daily, Use as directed to check blood sugar. May dispense any manufacturer covered by patient's insurance and fits patient's device.   lansoprazole  (PREVACID ) 30 mg, Oral, 2 times daily before meals   levocetirizine (XYZAL ) 5 mg, Daily at bedtime   levothyroxine  (SYNTHROID ) 150 mcg, Daily before breakfast   metolazone  (ZAROXOLYN ) 2.5 mg, Oral, Daily PRN   oxyCODONE  (ROXICODONE ) 5 mg, Oral, Every 4 hours PRN   pregabalin  (LYRICA ) 300 mg, Daily at bedtime   prochlorperazine  (COMPAZINE ) 10 mg, Daily at bedtime   promethazine  (PHENERGAN ) 12.5 mg, Oral, Every 12 hours PRN   rosuvastatin  (CRESTOR ) 10 mg, Oral, Daily   Safety Seal Miscellaneous MISC Hormonic Hair Solution with minoxidil USP 7% and finasteride USP 0.05% - apply to affected areas daily every morning.   torsemide  (DEMADEX ) 60 mg, Oral, Daily   tretinoin  (RETIN-A ) 0.05 % cream 1 Application, Daily at bedtime   TYLENOL  500-1,000 mg, Every 6 hours PRN    Diet Orders (From admission, onward)  Start     Ordered   12/07/23 2323  Diet heart healthy/carb modified Room service appropriate? Yes; Fluid consistency: Thin; Fluid restriction: 2000 mL Fluid  Diet effective now       Comments: Salt restriction 2 g/day  Question Answer Comment  Diet-HS Snack? Nothing   Room service appropriate? Yes   Fluid consistency: Thin   Fluid restriction: 2000 mL Fluid      12/07/23 2323            DVT prophylaxis: enoxaparin  (LOVENOX ) injection 30 mg Start: 12/07/23 1700   Lab Results  Component Value Date   PLT 191 12/10/2023      Code Status: Full Code  Family Communication: No family at bedside  Status is: Inpatient Remains inpatient appropriate because: Remains fluid overloaded   Level of care: Progressive  Consultants:  None  Objective: Vitals:   12/10/23 0016 12/10/23 0444 12/10/23 0827 12/10/23 1236  BP: 121/76  118/82 113/64  Pulse: 75  75 72   Resp: 20 18 20 20   Temp: (!) 97.5 F (36.4 C) 98.1 F (36.7 C)  98.1 F (36.7 C)  TempSrc: Axillary Oral Oral Oral  SpO2: 98%  98% 98%  Weight:  65.3 kg    Height:        Intake/Output Summary (Last 24 hours) at 12/10/2023 1404 Last data filed at 12/10/2023 1100 Gross per 24 hour  Intake 12929.03 ml  Output 1550 ml  Net 11379.03 ml   Wt Readings from Last 3 Encounters:  12/10/23 65.3 kg  11/10/23 65.8 kg  11/01/23 65.8 kg    Examination:  Exam:  Constitutional:  The patient is awake, alert, and oriented x 3. No acute distress. Respiratory:  No increased work of breathing. No wheezes, rales, or rhonchi No tactile fremitus Cardiovascular:  Regular rate and rhythm No murmurs, ectopy, or gallups. No lateral PMI. No thrills. Abdomen:  Abdomen is distended by soft.  Morbidly obese Unable to evaluate for fluid wave. No hernias, masses, or organomegaly Normoactive bowel sounds, but distant. Musculoskeletal:  No cyanosis, clubbing, or edema Skin:  No rashes, lesions, ulcers palpation of skin: no induration or nodules Neurologic:  CN 2-12 intact Sensation all 4 extremities intact Psychiatric:  Mental status Mood, affect appropriate Orientation to person, place, time  judgment and insight appear intact    Data Reviewed: I have independently reviewed following labs and imaging studies   CBC Recent Labs  Lab 12/07/23 0018 12/08/23 0245 12/09/23 0250 12/10/23 0352  WBC 5.9 7.6 7.7 7.7  HGB 10.8* 13.5 11.8* 12.7  HCT 31.5* 35.6* 32.2* 32.1*  PLT 113* 148* 141* 191  MCV 89.2 90.6 90.2 89.3  MCH 30.6 32.3 33.1 30.4  MCHC 34.3 35.7 36.6* 34.1  RDW 15.9* 15.9* 15.7* 17.2*  LYMPHSABS  --   --   --  3.4  MONOABS  --   --   --  0.5  EOSABS  --   --   --  0.1  BASOSABS  --   --   --  0.1    Recent Labs  Lab 12/07/23 0018 12/08/23 1112 12/08/23 1632 12/09/23 1316 12/09/23 1803 12/09/23 2225 12/10/23 0352  NA 127* 127*  --  115* 121* 114* 115*  K  4.2 5.7*  --  6.6* >7.5* >7.5* >7.5*  CL 88* 88*  --  79* 85* 78* 78*  CO2 21* 16*  --  21* 19* 18* 20*  GLUCOSE 218* 463*  --  269* 292* 321* 289*  BUN 42* 76*  --  91* 99* 97* 97*  CREATININE 1.63* 2.40*  --  2.57* 3.15* 2.61* 2.48*  CALCIUM  9.3 9.1  --  7.7* 7.9* 8.0* 8.0*  AST LIPEMIC SPECIMEN, RESULTS MAY BE AFFECTED. 105*  --  137*  --   --  183*  ALT LIPEMIC SPECIMEN, RESULTS MAY BE AFFECTED. 152*  --  116*  --   --  123*  ALKPHOS 178* 147*  --  120  --   --  119  BILITOT 0.5 1.1  --  3.1*  --   --  5.6*  ALBUMIN  4.1 4.2  --  3.6  --   --  3.6  MG  --   --  2.8* 2.4  --   --   --   TSH  --  3.174  --   --   --   --   --     ------------------------------------------------------------------------------------------------------------------ No results for input(s): CHOL, HDL, LDLCALC, TRIG, CHOLHDL, LDLDIRECT in the last 72 hours.  Lab Results  Component Value Date   HGBA1C 8.8 (H) 09/28/2023   ------------------------------------------------------------------------------------------------------------------ Recent Labs    12/08/23 1112  TSH 3.174    Cardiac Enzymes No results for input(s): CKMB, TROPONINI, MYOGLOBIN in the last 168 hours.  Invalid input(s): CK ------------------------------------------------------------------------------------------------------------------    Component Value Date/Time   BNP 20.9 11/10/2023 1627   BNP 27.7 11/10/2023 1238    CBG: Recent Labs  Lab 12/09/23 1109 12/09/23 1623 12/09/23 2129 12/10/23 0631 12/10/23 1105  GLUCAP 278* 214* 284* 318* 358*    No results found for this or any previous visit (from the past 240 hours).   Radiology Studies: US  Abdomen Limited RUQ (LIVER/GB) Result Date: 12/10/2023 CLINICAL DATA:  33 year old female undergoing evaluation for liver function, ascites. EXAM: ULTRASOUND ABDOMEN LIMITED RIGHT UPPER QUADRANT COMPARISON:  CT Abdomen and Pelvis 09/05/2023. FINDINGS:  Gallbladder: No gallstones or wall thickening visualized. No sonographic Murphy sign noted by sonographer. Common bile duct: Diameter: 5 mm, normal. Liver: Echogenic liver (image 12). No discrete liver lesion by ultrasound. No intrahepatic biliary ductal dilatation. Portal vein is patent on color Doppler imaging with normal direction of blood flow towards the liver. Other: Negative visible right kidney. No ascites identified on imaging of all 4 quadrants. IMPRESSION: 1. Evidence of Fatty Liver Disease. No discrete liver lesion. No ascites. 2. Negative gallbladder and bile ducts. Electronically Signed   By: VEAR Hurst M.D.   On: 12/10/2023 07:44     Renell Allum, DO [facility] 12/10/2023 14:41  Between 7 am - 7 pm I am available, please contact me via Amion (for emergencies) or Securechat (non urgent messages)  Between 7 pm - 7 am I am not available, please contact night coverage MD/APP via Amion

## 2023-12-10 NOTE — Progress Notes (Signed)
   12/10/23 0604  Assess: MEWS Score  ECG Heart Rate 77  Assess: MEWS Score  MEWS Temp 0  MEWS Systolic 0  MEWS Pulse 0  MEWS RR 0  MEWS LOC 0  MEWS Score 0  MEWS Score Color Green  Provider Notification  Provider Name/Title Dr. Lee  Date Provider Notified 12/10/23  Time Provider Notified [phone]  Method of Notification Page (secure chat)  Notification Reason Critical Result (Na- 115; K-greater than 7.5; blood hemolyzed)  Provider response No new orders  Date of Provider Response 12/10/23  Time of Provider Response  [phone])  Assess: SIRS CRITERIA  SIRS Temperature  0  SIRS Respirations  0  SIRS Pulse 0  SIRS WBC 0  SIRS Score Sum  0

## 2023-12-10 NOTE — Plan of Care (Signed)
  Problem: Education: Goal: Ability to describe self-care measures that may prevent or decrease complications (Diabetes Survival Skills Education) will improve Outcome: Progressing   Problem: Coping: Goal: Ability to adjust to condition or change in health will improve Outcome: Progressing   Problem: Fluid Volume: Goal: Ability to maintain a balanced intake and output will improve Outcome: Progressing   Problem: Health Behavior/Discharge Planning: Goal: Ability to identify and utilize available resources and services will improve Outcome: Progressing Goal: Ability to manage health-related needs will improve Outcome: Progressing   Problem: Metabolic: Goal: Ability to maintain appropriate glucose levels will improve Outcome: Progressing   Problem: Nutritional: Goal: Maintenance of adequate nutrition will improve Outcome: Progressing Goal: Progress toward achieving an optimal weight will improve Outcome: Progressing   Problem: Skin Integrity: Goal: Risk for impaired skin integrity will decrease Outcome: Progressing   Problem: Tissue Perfusion: Goal: Adequacy of tissue perfusion will improve Outcome: Progressing   Problem: Education: Goal: Knowledge of General Education information will improve Description: Including pain rating scale, medication(s)/side effects and non-pharmacologic comfort measures Outcome: Progressing

## 2023-12-10 NOTE — Plan of Care (Signed)
  Problem: Fluid Volume: Goal: Ability to maintain a balanced intake and output will improve Outcome: Progressing   Problem: Skin Integrity: Goal: Risk for impaired skin integrity will decrease Outcome: Progressing   Problem: Tissue Perfusion: Goal: Adequacy of tissue perfusion will improve Outcome: Progressing   Problem: Clinical Measurements: Goal: Will remain free from infection Outcome: Progressing Goal: Respiratory complications will improve Outcome: Progressing Goal: Cardiovascular complication will be avoided Outcome: Progressing   Problem: Elimination: Goal: Will not experience complications related to bowel motility Outcome: Progressing Goal: Will not experience complications related to urinary retention Outcome: Progressing   Problem: Safety: Goal: Ability to remain free from injury will improve Outcome: Progressing   Problem: Cardiac: Goal: Ability to achieve and maintain adequate cardiopulmonary perfusion will improve Outcome: Progressing

## 2023-12-10 NOTE — Progress Notes (Signed)
 Pt placed on CPAP at this time.   12/10/23 2211  BiPAP/CPAP/SIPAP  BiPAP/CPAP/SIPAP Pt Type Adult  BiPAP/CPAP/SIPAP Resmed  Reason BIPAP/CPAP not in use Other(comment)  Mask Type Full face mask  Dentures removed? Not applicable  Mask Size Medium  Respiratory Rate 16 breaths/min  FiO2 (%) 21 %  Flow Rate 0 lpm  Patient Home Machine No  Patient Home Mask No  Patient Home Tubing No  Auto Titrate Yes  Nasal massage performed Yes  CPAP/SIPAP surface wiped down Yes  Device Plugged into RED Power Outlet Yes

## 2023-12-11 ENCOUNTER — Ambulatory Visit (HOSPITAL_COMMUNITY)

## 2023-12-11 ENCOUNTER — Encounter (HOSPITAL_COMMUNITY): Payer: Self-pay | Admitting: Internal Medicine

## 2023-12-11 DIAGNOSIS — I5043 Acute on chronic combined systolic (congestive) and diastolic (congestive) heart failure: Secondary | ICD-10-CM | POA: Diagnosis not present

## 2023-12-11 LAB — BASIC METABOLIC PANEL WITH GFR
Anion gap: 17 — ABNORMAL HIGH (ref 5–15)
BUN: 106 mg/dL — ABNORMAL HIGH (ref 6–20)
CO2: 21 mmol/L — ABNORMAL LOW (ref 22–32)
Calcium: 8.4 mg/dL — ABNORMAL LOW (ref 8.9–10.3)
Chloride: 82 mmol/L — ABNORMAL LOW (ref 98–111)
Creatinine, Ser: 2.57 mg/dL — ABNORMAL HIGH (ref 0.44–1.00)
GFR, Estimated: 25 mL/min — ABNORMAL LOW (ref >60)
Glucose, Bld: 229 mg/dL — ABNORMAL HIGH (ref 70–99)
Potassium: 5.6 mmol/L — ABNORMAL HIGH (ref 3.5–5.1)
Sodium: 120 mmol/L — ABNORMAL LOW (ref 135–145)

## 2023-12-11 LAB — CBC WITH DIFFERENTIAL/PLATELET
Abs Immature Granulocytes: 0.08 K/uL — ABNORMAL HIGH (ref 0.00–0.07)
Basophils Absolute: 0.1 K/uL (ref 0.0–0.1)
Basophils Relative: 1 %
Eosinophils Absolute: 0.1 K/uL (ref 0.0–0.5)
Eosinophils Relative: 1 %
HCT: 34.9 % — ABNORMAL LOW (ref 36.0–46.0)
Hemoglobin: 11.6 g/dL — ABNORMAL LOW (ref 12.0–15.0)
Immature Granulocytes: 1 %
Lymphocytes Relative: 46 %
Lymphs Abs: 3.6 K/uL (ref 0.7–4.0)
MCH: 29.9 pg (ref 26.0–34.0)
MCHC: 33.2 g/dL (ref 30.0–36.0)
MCV: 89.9 fL (ref 80.0–100.0)
Monocytes Absolute: 0.6 K/uL (ref 0.1–1.0)
Monocytes Relative: 7 %
Neutro Abs: 3.5 K/uL (ref 1.7–7.7)
Neutrophils Relative %: 44 %
Platelets: 160 K/uL (ref 150–400)
RBC: 3.88 MIL/uL (ref 3.87–5.11)
RDW: 15.8 % — ABNORMAL HIGH (ref 11.5–15.5)
WBC: 8 K/uL (ref 4.0–10.5)
nRBC: 0.3 % — ABNORMAL HIGH (ref 0.0–0.2)

## 2023-12-11 LAB — GLUCOSE, CAPILLARY
Glucose-Capillary: 220 mg/dL — ABNORMAL HIGH (ref 70–99)
Glucose-Capillary: 243 mg/dL — ABNORMAL HIGH (ref 70–99)
Glucose-Capillary: 201 mg/dL — ABNORMAL HIGH (ref 70–99)
Glucose-Capillary: 255 mg/dL — ABNORMAL HIGH (ref 70–99)

## 2023-12-11 MED ORDER — INSULIN GLARGINE 100 UNIT/ML ~~LOC~~ SOLN
35.0000 [IU] | Freq: Two times a day (BID) | SUBCUTANEOUS | Status: DC
  Administered 2023-12-11 – 2023-12-12 (×2): 35 [IU] via SUBCUTANEOUS
  Filled 2023-12-11 (×3): qty 0.35

## 2023-12-11 NOTE — Plan of Care (Signed)

## 2023-12-11 NOTE — Plan of Care (Signed)
  Problem: Coping: Goal: Ability to adjust to condition or change in health will improve Outcome: Progressing   Problem: Fluid Volume: Goal: Ability to maintain a balanced intake and output will improve Outcome: Progressing   Problem: Health Behavior/Discharge Planning: Goal: Ability to manage health-related needs will improve Outcome: Progressing   Problem: Skin Integrity: Goal: Risk for impaired skin integrity will decrease Outcome: Progressing   Problem: Tissue Perfusion: Goal: Adequacy of tissue perfusion will improve Outcome: Progressing   Problem: Clinical Measurements: Goal: Will remain free from infection Outcome: Progressing Goal: Respiratory complications will improve Outcome: Progressing Goal: Cardiovascular complication will be avoided Outcome: Progressing   Problem: Elimination: Goal: Will not experience complications related to bowel motility Outcome: Progressing Goal: Will not experience complications related to urinary retention Outcome: Progressing   Problem: Safety: Goal: Ability to remain free from injury will improve Outcome: Progressing

## 2023-12-11 NOTE — TOC Initial Note (Signed)
 Transition of Care [facility]) - Initial/Assessment Note    Patient Details  Name: Jennifer [name] MRN: [id number] Date of Birth: December 12, 1990  [facility] CM/SW Contact:    Waddell Barnie Rama, RN Phone Number: 12/11/2023, 3:41 PM  Clinical Narrative:                 From home alone indep, has PCP and insurance on file, states has no HH services in place at this time or DME at home.  States she will call an uber to transport her home at [employer] and she has no support system, states gets medications from [facility].  Pta self ambulatory.   There are no ICM ( Inpatient Case Management) needs identified  at this time.  Please place consult for any ICM (Inpatient Case Management)  needs.    Expected Discharge Plan: Home/Self Care Barriers to Discharge: Continued Medical Work up   Patient Goals and CMS Choice Patient states their goals for this hospitalization and ongoing recovery are:: return home   Choice offered to / list presented to : NA      Expected Discharge Plan and Services In-house Referral: NA Discharge Planning Services: CM Consult Post Acute Care Choice: NA Living arrangements for the past 2 months: Single Family Home                 DME Arranged: N/A DME Agency: NA       HH Arranged: NA          Prior Living Arrangements/Services Living arrangements for the past 2 months: Single Family Home Lives with:: Self Patient language and need for interpreter reviewed:: Yes Do you feel safe going back to the place where you live?: Yes      Need for Family Participation in Patient Care: No (Comment) Care giver support system in place?: No (comment)   Criminal Activity/Legal Involvement Pertinent to Current Situation/Hospitalization: No - Comment as needed  Activities of Daily Living      Permission Sought/Granted Permission sought to share information with : Case Manager Permission granted to share information with : Yes, Verbal Permission  Granted              Emotional Assessment Appearance:: Appears stated age Attitude/Demeanor/Rapport: Engaged Affect (typically observed): Appropriate Orientation: : Oriented to Self, Oriented to Place, Oriented to  Time, Oriented to Situation Alcohol  / Substance Use: Not Applicable Psych Involvement: No (comment)  Admission diagnosis:  Acute exacerbation of CHF (congestive heart failure) (HCC) [I50.9] Congestive heart failure, unspecified HF chronicity, unspecified heart failure type (HCC) [I50.9] Patient Active Problem List   Diagnosis Date Noted   Acute exacerbation of CHF (congestive heart failure) (HCC) 12/07/2023   Controlled type 2 diabetes mellitus with hyperglycemia (HCC) 08/31/2023   Nausea without vomiting 08/31/2023   Epigastric pain 08/31/2023   Sleep apnea 08/30/2023   Abscess 08/30/2023   SOB (shortness of breath) 08/20/2023   Dizziness 08/20/2023   Chronic pancreatitis (HCC) 08/15/2023   Witnessed episode of apnea 06/27/2023   Pseudohyponatremia 04/10/2023   Metabolic acidosis 04/10/2023   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 coli UTI (urinary tract infection) 06/01/2022   DKA (diabetic ketoacidosis) (HCC) 06/01/2022   ASCUS of cervix with negative high risk HPV 05/31/2022   Acute on chronic pancreatitis (HCC) 05/29/2022   Hypokalemia 03/23/2022   COVID 03/15/2022   COVID-19 virus infection 03/14/2022   Hyponatremia 03/14/2022   Positive D dimer  03/14/2022   Stage 3b chronic kidney disease (CKD) (HCC) 03/14/2022   Pulmonary hypertension (HCC) 02/06/2022   NASH (nonalcoholic steatohepatitis) 02/06/2022   Obesity (BMI 30-39.9) 02/06/2022   Heart failure (HCC) 12/03/2021   Acute on chronic systolic CHF (congestive heart failure) (HCC) 12/02/2021   Elevated troponin 12/02/2021   Pancytopenia (HCC) 12/02/2021   Amenorrhea 12/02/2021   Hepatic steatosis 09/27/2021   Hyperosmolar hyperglycemic state (HHS) (HCC) 08/26/2021   Small intestinal bacterial overgrowth (SIBO) 08/26/2021   Lower extremity edema 08/26/2021   Hyperkalemia 08/14/2021   Hx of insulin  dependent diabetes mellitus 08/14/2021   CKD stage 3b, GFR 30-44 ml/min (HCC) 05/13/2021   Gastroparesis due to DM (HCC) 05/12/2021   Abdominal distension 05/12/2021   Chest pain 03/10/2021   Acute on chronic combined systolic and diastolic CHF (congestive heart failure) (HCC) 02/09/2021   History of astrocytoma of brain 02/09/2021   Type 2 diabetes mellitus with hyperglycemia, with long-term current use of insulin  (HCC) 01/25/2021   Diarrhea    Elevated transaminase level    Orthostatic hypotension 11/28/2020   Acute combined systolic and diastolic heart failure (HCC)    Nonischemic cardiomyopathy (HCC)    Acute systolic congestive heart failure (HCC)    Elevated liver enzymes    Acute renal failure superimposed on stage 3b chronic kidney disease (HCC) 11/26/2020   Intractable abdominal pain 11/23/2020   Chronic systolic congestive heart failure (HCC) 11/23/2020   Essential hypertension 11/23/2020   Familial hypertriglyceridemia 11/23/2020   Prolonged QT interval 11/23/2020   Finger mass, left 08/12/2020   Nausea and vomiting 07/23/2020   Polycystic ovary syndrome 01/30/2020   Moderate aortic insufficiency 08/16/2019   Moderate mitral regurgitation 08/16/2019   Lipoprotein lipase deficiency, familial 06/19/2019   Microalbuminuria due to type 1 diabetes mellitus (HCC)  06/19/2019   Type 2 diabetes mellitus with hyperlipidemia (HCC) 10/10/2018   Hypothyroidism 10/10/2018   PCP:  Emilio Joesph DEL, PA-C Pharmacy:   [facility] - [place], [place] - [address] [place] [address] Phone: [phone] Fax: [phone]     Social Drivers of Health (SDOH) Social History: SDOH Screenings   Food Insecurity: No Food Insecurity (12/07/2023)  Housing: Low Risk  (12/07/2023)  Transportation Needs: No Transportation Needs (12/07/2023)  Utilities: Not At Risk (12/07/2023)  Alcohol  Screen: Low Risk  (12/10/2021)  Depression (PHQ2-9): Low Risk  (12/10/2021)  Financial Resource Strain: Low Risk  (10/24/2023)   Received from [facility]  Physical Activity: Unknown (10/24/2023)   Received from [facility]  Social Connections: Patient Declined (10/24/2023)   Received from [facility]  Stress: Patient Declined (10/24/2023)   Received from [facility]  Tobacco Use: Low Risk  (12/07/2023)   SDOH Interventions:     Readmission Risk Interventions    12/11/2023    3:37 PM 08/21/2023    2:55 PM 05/08/2023    4:21 PM  Readmission Risk Prevention Plan  Transportation Screening Complete Complete Complete  Medication Review (RN Care Manager) Complete Complete Referral to Pharmacy  PCP or Specialist appointment within 3-5 days of discharge  Complete Complete  HRI or Home Care Consult Complete Complete Complete  SW Recovery Care/Counseling Consult  Complete Complete  Palliative Care Screening Not Applicable Not Applicable Not Applicable  Skilled Nursing Facility Not Applicable Complete Not Applicable

## 2023-12-11 NOTE — Progress Notes (Signed)
 PROGRESS NOTE  Jennifer [name] FMW:[id number] DOB: 11-25-1990 DOA: 12/07/2023 PCP: Emilio Joesph DEL, PA-C   LOS: 4 days   Brief Narrative / Interim history: 33/F w chronic combined CHF with prior EF 29%, recovered to 45-50%, OSA, CAD, astrocytoma s/p resection 1997 with residual left-sided weakness, hypertension, hyperlipidemia, chronic pancreatitis, DM 2 on insulin , NAFLD comes into the hospital with abdominal distention, fluid overload, weight gain.  She reports that this is typical for CHF exacerbations, she rarely gets fluid in her legs but mostly gets abdominal distention and discomfort.  Reports compliance with Lasix   Subjective / 24h Interval events: Feels better, abdominal swelling is improving -Dr.Swayze's notes 9/13-14 reviewed  Assesement and Plan: Principal problem Acute on chronic combined CHF - -Most recent 2D echo done May 2025 shows LVEF 45-50%, global hypokinesis, RV was normal,  -Finally starting to diurese, over 2 L negative yesterday, net I's and O's are inaccurate -Continue Lasix  high dose IV, monitor urine output closely, reports dry weight used to be 120 last year, now closer to 140 - GDMT limited by CKD 4  Hyperkalemia - Secondary to hemolyzed labs yesterday, K5.6 this morning, anticipate improvement with IV Lasix  will repeat this afternoon  Hypertensive urgency-were started on Coreg , hydralazine , furosemide . - Now stable   Type 2 diabetes mellitus -poorly controlled, with hyperglycemia.  [name] on very high doses of insulin  at home - Increase glargine dose, on high dose meal coverage   Hypothyroidism -TSH is normal.   Chronic kidney disease stage V  - Baseline creatinine 1.8-2.5, stable, monitor with diuresis   Hyponatremia - Finally improving, monitor   Thrombocytopenia-has been thrombocytopenia before, no bleeding, monitor, improved today   OSA-placed on CPAP   Obesity, class I-BMI 31, will have to re-calculate once she gets diuresed    Hyperlipidemia-Continue Crestor  10 mg daily as at home. Apparently this is causing an issue with lab draws.   Depression-continue home medications  Family Communication: No family at bedside  Status is: Inpatient Remains inpatient appropriate because: Remains fluid overloaded   Level of care: Progressive   Scheduled Meds:  aspirin  EC  81 mg Oral Daily   carvedilol   9.375 mg Oral BID   dicyclomine   20 mg Oral BID   DULoxetine   60 mg Oral QHS   enoxaparin  (LOVENOX ) injection  30 mg Subcutaneous Q24H   famotidine   20 mg Oral BID   fenofibrate   54 mg Oral Daily   insulin  aspart  0-15 Units Subcutaneous TID WC   insulin  aspart  15 Units Subcutaneous TID WC   insulin  glargine  30 Units Subcutaneous BID   isosorbide  mononitrate  30 mg Oral Daily   ivabradine   7.5 mg Oral BID WC   levothyroxine   150 mcg Oral Q0600   pregabalin   300 mg Oral QHS   rosuvastatin   10 mg Oral Daily   Continuous Infusions:  furosemide  120 mg (12/11/23 0450)   PRN Meds:.acetaminophen , labetalol   Current Outpatient Medications  Medication Instructions   aspirin  EC 81 mg, Oral, Daily, Swallow whole.   azelastine  (ASTELIN ) 0.1 % nasal spray 2 sprays, Daily PRN   BAQSIMI ONE PACK 3 MG/DOSE POWD 1 Dose, Once PRN   calcitRIOL  (ROCALTROL ) 0.25 mcg, Daily   carvedilol  (COREG ) 9.375 mg, Oral, 2 times daily   Continuous Glucose Sensor (DEXCOM G7 SENSOR) MISC 1 Device, See admin instructions   CREON  36000-114000 units CPEP capsule 36,000 Units, Oral, 3 times daily   dicyclomine  (BENTYL ) 20 mg, Oral, 2 times daily   DULoxetine  (  CYMBALTA ) 60 mg, Daily at bedtime   ergocalciferol  (VITAMIN D2) 50,000 Units, Oral, Every Sun   fenofibrate  54 mg, Oral, Daily   Glucose Blood (BLOOD GLUCOSE TEST STRIPS) STRP 1 each, Does not apply, 3 times daily, Use as directed to check blood sugar. May dispense any manufacturer covered by patient's insurance and fits patient's device.   HumaLOG  KwikPen 60-95 Units, See admin  instructions   HumuLIN  R U-500 KwikPen 195-300 Units, See admin instructions   hydrALAZINE  (APRESOLINE ) 50 mg, Daily   Insulin  Pen Needle (PEN NEEDLES) 31G X 5 MM MISC Use as directed 3 (three) times daily.   isosorbide  mononitrate (IMDUR ) 30 mg, Oral, Daily   ivabradine  (CORLANOR ) 7.5 mg, Oral, 2 times daily with meals   Lancet Device MISC 1 each, Does not apply, 3 times daily, May dispense any manufacturer covered by patient's insurance.   Lancets MISC 1 each, Does not apply, 3 times daily, Use as directed to check blood sugar. May dispense any manufacturer covered by patient's insurance and fits patient's device.   lansoprazole  (PREVACID ) 30 mg, Oral, 2 times daily before meals   levocetirizine (XYZAL ) 5 mg, Daily at bedtime   levothyroxine  (SYNTHROID ) 150 mcg, Daily before breakfast   metolazone  (ZAROXOLYN ) 2.5 mg, Oral, Daily PRN   oxyCODONE  (ROXICODONE ) 5 mg, Oral, Every 4 hours PRN   pregabalin  (LYRICA ) 300 mg, Daily at bedtime   prochlorperazine  (COMPAZINE ) 10 mg, Daily at bedtime   promethazine  (PHENERGAN ) 12.5 mg, Oral, Every 12 hours PRN   rosuvastatin  (CRESTOR ) 10 mg, Oral, Daily   Safety Seal Miscellaneous MISC Hormonic Hair Solution with minoxidil USP 7% and finasteride USP 0.05% - apply to affected areas daily every morning.   torsemide  (DEMADEX ) 60 mg, Oral, Daily   tretinoin  (RETIN-A ) 0.05 % cream 1 Application, Daily at bedtime   TYLENOL  500-1,000 mg, Every 6 hours PRN    Code Status: Full Code   Consultants:  None  Objective: Vitals:   12/10/23 1956 12/11/23 0045 12/11/23 0443 12/11/23 0842  BP: 115/77 137/83 (!) 143/81 131/84  Pulse: 75 70  72  Resp: 18 18 19 20   Temp: 98.7 F (37.1 C) 98.4 F (36.9 C) 97.8 F (36.6 C) 98.3 F (36.8 C)  TempSrc: Oral Oral Oral Oral  SpO2: 95% 97% 95% 93%  Weight:   65 kg   Height:        Intake/Output Summary (Last 24 hours) at 12/11/2023 1114 Last data filed at 12/11/2023 0904 Gross per 24 hour  Intake 580.36 ml   Output 2250 ml  Net -1669.64 ml   Wt Readings from Last 3 Encounters:  12/11/23 65 kg  11/10/23 65.8 kg  11/01/23 65.8 kg    Examination:  Exam: Gen: Awake, Alert, Oriented X 3, [place] ill-appearing HEENT: Positive JVD, excessive facial hair Lungs: Good air movement bilaterally, CTAB CVS: S1S2/RRR Abd: soft, distended, nontender, bowel sounds present Extremities: Edema Skin: no new rashes on exposed skin     Data Reviewed: I have independently reviewed following labs and imaging studies   CBC Recent Labs  Lab 12/07/23 0018 12/08/23 0245 12/09/23 0250 12/10/23 0352 12/11/23 0249  WBC 5.9 7.6 7.7 7.7 8.0  HGB 10.8* 13.5 11.8* 12.7 11.6*  HCT 31.5* 35.6* 32.2* 32.1* 34.9*  PLT 113* 148* 141* 191 160  MCV 89.2 90.6 90.2 89.3 89.9  MCH 30.6 32.3 33.1 30.4 29.9  MCHC 34.3 35.7 36.6* 34.1 33.2  RDW 15.9* 15.9* 15.7* 17.2* 15.8*  LYMPHSABS  --   --   --  3.4 3.6  MONOABS  --   --   --  0.5 0.6  EOSABS  --   --   --  0.1 0.1  BASOSABS  --   --   --  0.1 0.1    Recent Labs  Lab 12/07/23 0018 12/08/23 1112 12/08/23 1632 12/09/23 1316 12/09/23 1803 12/09/23 2225 12/10/23 0352 12/11/23 0249  NA 127* 127*  --  115* 121* 114* 115* 120*  K 4.2 5.7*  --  6.6* >7.5* >7.5* >7.5* 5.6*  CL 88* 88*  --  79* 85* 78* 78* 82*  CO2 21* 16*  --  21* 19* 18* 20* 21*  GLUCOSE 218* 463*  --  269* 292* 321* 289* 229*  BUN 42* 76*  --  91* 99* 97* 97* 106*  CREATININE 1.63* 2.40*  --  2.57* 3.15* 2.61* 2.48* 2.57*  CALCIUM  9.3 9.1  --  7.7* 7.9* 8.0* 8.0* 8.4*  AST LIPEMIC SPECIMEN, RESULTS MAY BE AFFECTED. 105*  --  137*  --   --  183*  --   ALT LIPEMIC SPECIMEN, RESULTS MAY BE AFFECTED. 152*  --  116*  --   --  123*  --   ALKPHOS 178* 147*  --  120  --   --  119  --   BILITOT 0.5 1.1  --  3.1*  --   --  5.6*  --   ALBUMIN  4.1 4.2  --  3.6  --   --  3.6  --   MG  --   --  2.8* 2.4  --   --   --   --   TSH  --  3.174  --   --   --   --   --   --      ------------------------------------------------------------------------------------------------------------------ No results for input(s): CHOL, HDL, LDLCALC, TRIG, CHOLHDL, LDLDIRECT in the last 72 hours.  Lab Results  Component Value Date   HGBA1C 8.8 (H) 09/28/2023   ------------------------------------------------------------------------------------------------------------------ No results for input(s): TSH, T4TOTAL, T3FREE, THYROIDAB in the last 72 hours.  Invalid input(s): FREET3   Cardiac Enzymes No results for input(s): CKMB, TROPONINI, MYOGLOBIN in the last 168 hours.  Invalid input(s): CK ------------------------------------------------------------------------------------------------------------------    Component Value Date/Time   BNP 20.9 11/10/2023 1627   BNP 27.7 11/10/2023 1238    CBG: Recent Labs  Lab 12/10/23 0631 12/10/23 1105 12/10/23 1632 12/10/23 2033 12/11/23 0602  GLUCAP 318* 358* 124* 264* 220*    No results found for this or any previous visit (from the past 240 hours).   Radiology Studies: No results found.   Sigurd Pac, MD [facility] 12/09/2023 14:41  Between 7 pm - 7 am I am not available, please contact night coverage MD/APP via Amion

## 2023-12-11 NOTE — Progress Notes (Signed)
Placed patient on CPAP for the night via auto-mode.  

## 2023-12-12 ENCOUNTER — Inpatient Hospital Stay (HOSPITAL_COMMUNITY)

## 2023-12-12 ENCOUNTER — Ambulatory Visit: Admitting: Dermatology

## 2023-12-12 DIAGNOSIS — I5033 Acute on chronic diastolic (congestive) heart failure: Secondary | ICD-10-CM | POA: Diagnosis not present

## 2023-12-12 DIAGNOSIS — I5082 Biventricular heart failure: Secondary | ICD-10-CM

## 2023-12-12 DIAGNOSIS — N184 Chronic kidney disease, stage 4 (severe): Secondary | ICD-10-CM | POA: Diagnosis not present

## 2023-12-12 DIAGNOSIS — N179 Acute kidney failure, unspecified: Secondary | ICD-10-CM | POA: Diagnosis not present

## 2023-12-12 DIAGNOSIS — I5043 Acute on chronic combined systolic (congestive) and diastolic (congestive) heart failure: Secondary | ICD-10-CM | POA: Diagnosis not present

## 2023-12-12 LAB — BASIC METABOLIC PANEL WITH GFR
Anion gap: 23 — ABNORMAL HIGH (ref 5–15)
Anion gap: 18 — ABNORMAL HIGH (ref 5–15)
BUN: 125 mg/dL — ABNORMAL HIGH (ref 6–20)
BUN: 133 mg/dL — ABNORMAL HIGH (ref 6–20)
CO2: 18 mmol/L — ABNORMAL LOW (ref 22–32)
CO2: 22 mmol/L (ref 22–32)
Calcium: 8.3 mg/dL — ABNORMAL LOW (ref 8.9–10.3)
Calcium: 8.2 mg/dL — ABNORMAL LOW (ref 8.9–10.3)
Chloride: 84 mmol/L — ABNORMAL LOW (ref 98–111)
Chloride: 85 mmol/L — ABNORMAL LOW (ref 98–111)
Creatinine, Ser: 3.15 mg/dL — ABNORMAL HIGH (ref 0.44–1.00)
Creatinine, Ser: 3.69 mg/dL — ABNORMAL HIGH (ref 0.44–1.00)
GFR, Estimated: 19 mL/min — ABNORMAL LOW (ref >60)
GFR, Estimated: 16 mL/min — ABNORMAL LOW (ref >60)
Glucose, Bld: 221 mg/dL — ABNORMAL HIGH (ref 70–99)
Glucose, Bld: 255 mg/dL — ABNORMAL HIGH (ref 70–99)
Sodium: 125 mmol/L — ABNORMAL LOW (ref 135–145)
Sodium: 125 mmol/L — ABNORMAL LOW (ref 135–145)

## 2023-12-12 LAB — ECHOCARDIOGRAM COMPLETE
Area-P 1/2: 3.6 cm2
Height: 57 in
S' Lateral: 3.6 cm
Weight: 2275.2 [oz_av]

## 2023-12-12 LAB — LACTIC ACID, PLASMA: Lactic Acid, Venous: 2 mmol/L (ref 0.5–1.9)

## 2023-12-12 LAB — GLUCOSE, CAPILLARY
Glucose-Capillary: 219 mg/dL — ABNORMAL HIGH (ref 70–99)
Glucose-Capillary: 230 mg/dL — ABNORMAL HIGH (ref 70–99)
Glucose-Capillary: 304 mg/dL — ABNORMAL HIGH (ref 70–99)
Glucose-Capillary: 211 mg/dL — ABNORMAL HIGH (ref 70–99)
Glucose-Capillary: 248 mg/dL — ABNORMAL HIGH (ref 70–99)

## 2023-12-12 LAB — CBC
HCT: 32.9 % — ABNORMAL LOW (ref 36.0–46.0)
Hemoglobin: 11.3 g/dL — ABNORMAL LOW (ref 12.0–15.0)
MCH: 30.1 pg (ref 26.0–34.0)
MCHC: 34.3 g/dL (ref 30.0–36.0)
MCV: 87.7 fL (ref 80.0–100.0)
Platelets: 230 K/uL (ref 150–400)
RBC: 3.75 MIL/uL — ABNORMAL LOW (ref 3.87–5.11)
RDW: 15.9 % — ABNORMAL HIGH (ref 11.5–15.5)
WBC: 7 K/uL (ref 4.0–10.5)
nRBC: 0.7 % — ABNORMAL HIGH (ref 0.0–0.2)

## 2023-12-12 MED ORDER — SODIUM CHLORIDE 0.9 % IV SOLN: INTRAVENOUS | Status: DC

## 2023-12-12 MED ORDER — FAMOTIDINE 20 MG PO TABS
20.0000 mg | ORAL_TABLET | Freq: Every day | ORAL | Status: DC
  Administered 2023-12-13: 20 mg via ORAL
  Filled 2023-12-12: qty 1

## 2023-12-12 MED ORDER — INSULIN GLARGINE 100 UNIT/ML ~~LOC~~ SOLN
40.0000 [IU] | Freq: Two times a day (BID) | SUBCUTANEOUS | Status: DC
  Administered 2023-12-12 – 2023-12-14 (×4): 40 [IU] via SUBCUTANEOUS
  Filled 2023-12-12 (×5): qty 0.4

## 2023-12-12 MED ORDER — MIDODRINE HCL 5 MG PO TABS: 10.0000 mg | ORAL_TABLET | Freq: Once | ORAL | Status: DC

## 2023-12-12 MED ORDER — ASPIRIN 81 MG PO CHEW
81.0000 mg | CHEWABLE_TABLET | ORAL | Status: AC
  Administered 2023-12-13: 81 mg via ORAL
  Filled 2023-12-12: qty 1

## 2023-12-12 NOTE — Progress Notes (Addendum)
 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  She reports that this is typical for CHF exacerbations, she rarely swells in her legs but often gets abdominal distention and discomfort.  Reports compliance with Lasix . - Admitted, started on diuretics, limited response,  -Lasix  dose increased to 120 mg twice daily Dr.Gherghe -9/16 with worsening AKI, BUN  Subjective / 24h Interval events: Feels better overall, continues to have abdominal swelling  Assesement and Plan:  Acute on chronic combined CHF - -Most recent 2D echo done May 2025 shows LVEF 45-50%, global hypokinesis, RV was normal,  -net I's and O's are inaccurate, wt largely unchanged despite high dose IV lasix  -Creatinine progressively worsening, does not appear that overloaded now, abdominal ultrasound negative for ascites -Will request CHF team input, may benefit from right heart cath -Hold further IV Lasix , Coreg  - GDMT limited by CKD 4  Hyperkalemia - Secondary to hemolyzed labs yesterday, - Labs still reporting hemolyzed specimen, I suspect this is secondary to her very high triglyceride levels, repeat labs this afternoon  Hypertensive urgency-were started on Coreg , hydralazine , furosemide  on admission. - Now BP is soft hold Coreg  and Lasix    Type 2 diabetes mellitus -poorly controlled, with hyperglycemia.  [name] on very high doses of insulin  at home - Increased glargine dose, on high dose meal coverage   Hypothyroidism -TSH is normal.   AKI on chronic kidney disease stage V  - Baseline creatinine 1.8-2.5,  - Now  worsening, likely cardiorenal, holding Lasix  and Coreg  today, heart failure team consulting   Hyponatremia - Finally improving, monitor   Thrombocytopenia-has been thrombocytopenia before, no bleeding, monitor, improved today  Dyslipidemia Severe hypertriglyceridemia -Continue fenofibrate    OSA-placed on CPAP   Obesity, class I-BMI 31, will have to re-calculate once she gets diuresed   Depression-continue home medications  DVT prophylaxis: Lovenox  CODE STATUS full code Family Communication: No family at bedside Disposition remains inpatient   Scheduled Meds:  aspirin  EC  81 mg Oral Daily   dicyclomine   20 mg Oral BID   DULoxetine   60 mg Oral QHS   enoxaparin  (LOVENOX ) injection  30 mg Subcutaneous Q24H   famotidine   20 mg Oral BID   fenofibrate   54 mg Oral Daily   insulin  aspart  0-15 Units Subcutaneous TID WC   insulin  aspart  15 Units Subcutaneous TID WC   insulin  glargine  35 Units Subcutaneous BID   ivabradine   7.5 mg Oral BID WC   levothyroxine   150 mcg Oral Q0600   midodrine   10 mg Oral Once   pregabalin   300 mg Oral QHS   rosuvastatin   10 mg Oral Daily   Continuous Infusions:   PRN Meds:.acetaminophen   Current Outpatient Medications  Medication Instructions   aspirin  EC 81 mg, Oral, Daily, Swallow whole.   azelastine  (ASTELIN ) 0.1 % nasal spray 2 sprays, Daily PRN   BAQSIMI ONE PACK 3 MG/DOSE POWD 1 Dose, Once PRN   calcitRIOL  (ROCALTROL ) 0.25 mcg, Daily   carvedilol  (COREG ) 9.375 mg, Oral, 2 times daily   Continuous Glucose Sensor (DEXCOM G7 SENSOR) MISC 1 Device, See admin instructions   CREON  36000-114000 units CPEP capsule  36,000 Units, Oral, 3 times daily   dicyclomine  (BENTYL ) 20 mg, Oral, 2 times daily   DULoxetine  (CYMBALTA ) 60 mg, Daily at bedtime   ergocalciferol  (VITAMIN D2) 50,000 Units, Oral, Every Sun   fenofibrate  54 mg, Oral, Daily   Glucose Blood (BLOOD GLUCOSE TEST STRIPS) STRP 1 each, Does not apply, 3 times daily, Use as directed to  check blood sugar. May dispense any manufacturer covered by patient's insurance and fits patient's device.   HumaLOG  KwikPen 60-95 Units, See admin instructions   HumuLIN  R U-500 KwikPen 195-300 Units, See admin instructions   hydrALAZINE  (APRESOLINE ) 50 mg, Daily   Insulin  Pen Needle (PEN NEEDLES) 31G X 5 MM MISC Use as directed 3 (three) times daily.   isosorbide  mononitrate (IMDUR ) 30 mg, Oral, Daily   ivabradine  (CORLANOR ) 7.5 mg, Oral, 2 times daily with meals   Lancet Device MISC 1 each, Does not apply, 3 times daily, May dispense any manufacturer covered by patient's insurance.   Lancets MISC 1 each, Does not apply, 3 times daily, Use as directed to check blood sugar. May dispense any manufacturer covered by patient's insurance and fits patient's device.   lansoprazole  (PREVACID ) 30 mg, Oral, 2 times daily before meals   levocetirizine (XYZAL ) 5 mg, Daily at bedtime   levothyroxine  (SYNTHROID ) 150 mcg, Daily before breakfast   metolazone  (ZAROXOLYN ) 2.5 mg, Oral, Daily PRN   oxyCODONE  (ROXICODONE ) 5 mg, Oral, Every 4 hours PRN   pregabalin  (LYRICA ) 300 mg, Daily at bedtime   prochlorperazine  (COMPAZINE ) 10 mg, Daily at bedtime   promethazine  (PHENERGAN ) 12.5 mg, Oral, Every 12 hours PRN   rosuvastatin  (CRESTOR ) 10 mg, Oral, Daily   Safety Seal Miscellaneous MISC Hormonic Hair Solution with minoxidil USP 7% and finasteride USP 0.05% - apply to affected areas daily every morning.   torsemide  (DEMADEX ) 60 mg, Oral, Daily   tretinoin  (RETIN-A ) 0.05 % cream 1 Application, Daily at bedtime   TYLENOL  500-1,000 mg, Every 6 hours PRN    Code Status: Full Code   Consultants:  None  Objective: Vitals:   12/12/23 0801 12/12/23 1027 12/12/23 1229 12/12/23 1246  BP: (!) 140/85 118/73 (!) 77/51 107/62  Pulse: 76 72    Resp: 20 20    Temp: 98.2 F (36.8 C) 98.1 F (36.7 C) 98.8 F (37.1 C)   TempSrc: Oral Oral Oral   SpO2: 97% 95% 95%   Weight:      Height:        Intake/Output  Summary (Last 24 hours) at 12/12/2023 1301 Last data filed at 12/12/2023 1222 Gross per 24 hour  Intake 955.48 ml  Output 1950 ml  Net -994.52 ml   Wt Readings from Last 3 Encounters:  12/12/23 64.5 kg  11/10/23 65.8 kg  11/01/23 65.8 kg    Examination:  Exam: Gen: Awake, Alert, Oriented X 3, chronically ill-appearing HEENT: Unable to assess JVD, excessive facial hair Lungs: Good air movement bilaterally, CTAB CVS: S1S2/RRR Abd: soft, distended, nontender, bowel sounds present Extremities:no Edema Skin: no new rashes on exposed skin     Data Reviewed: I have independently reviewed following labs and imaging studies   CBC Recent Labs  Lab 12/08/23 0245 12/09/23 0250 12/10/23 0352 12/11/23 0249 12/12/23 0342  WBC 7.6 7.7 7.7 8.0 7.0  HGB 13.5 11.8* 12.7 11.6* 11.3*  HCT 35.6* 32.2* 32.1* 34.9* 32.9*  PLT 148* 141* 191 160 230  MCV 90.6 90.2 89.3 89.9 87.7  MCH 32.3 33.1 30.4 29.9 30.1  MCHC 35.7  36.6* 34.1 33.2 34.3  RDW 15.9* 15.7* 17.2* 15.8* 15.9*  LYMPHSABS  --   --  3.4 3.6  --   MONOABS  --   --  0.5 0.6  --   EOSABS  --   --  0.1 0.1  --   BASOSABS  --   --  0.1 0.1  --     Recent Labs  Lab 12/07/23 0018 12/08/23 1112 12/08/23 1632 12/09/23 1316 12/09/23 1803 12/09/23 2225 12/10/23 0352 12/11/23 0249 12/12/23 0342 12/12/23 1145  NA 127* 127*  --  115* 121* 114* 115* 120* 125*  --   K 4.2 5.7*  --  6.6* >7.5* >7.5* >7.5* 5.6* NOT DONE  --   CL 88* 88*  --  79* 85* 78* 78* 82* 84*  --   CO2 21* 16*  --  21* 19* 18* 20* 21* 18*  --   GLUCOSE 218* 463*  --  269* 292* 321* 289* 229* 221*  --   BUN 42* 76*  --  91* 99* 97* 97* 106* 125*  --   CREATININE 1.63* 2.40*  --  2.57* 3.15* 2.61* 2.48* 2.57* 3.15*  --   CALCIUM  9.3 9.1  --  7.7* 7.9* 8.0* 8.0* 8.4* 8.3*  --   AST LIPEMIC SPECIMEN, RESULTS MAY BE AFFECTED. 105*  --  137*  --   --  183*  --   --   --   ALT LIPEMIC SPECIMEN, RESULTS MAY BE AFFECTED. 152*  --  116*  --   --  123*  --   --   --    ALKPHOS 178* 147*  --  120  --   --  119  --   --   --   BILITOT 0.5 1.1  --  3.1*  --   --  5.6*  --   --   --   ALBUMIN  4.1 4.2  --  3.6  --   --  3.6  --   --   --   MG  --   --  2.8* 2.4  --   --   --   --   --   --   LATICACIDVEN  --   --   --   --   --   --   --   --   --  2.0*  TSH  --  3.174  --   --   --   --   --   --   --   --     ------------------------------------------------------------------------------------------------------------------ No results for input(s): CHOL, HDL, LDLCALC, TRIG, CHOLHDL, LDLDIRECT in the last 72 hours.  Lab Results  Component Value Date   HGBA1C 8.8 (H) 09/28/2023   ------------------------------------------------------------------------------------------------------------------ No results for input(s): TSH, T4TOTAL, T3FREE, THYROIDAB in the last 72 hours.  Invalid input(s): FREET3   Cardiac Enzymes No results for input(s): CKMB, TROPONINI, MYOGLOBIN in the last 168 hours.  Invalid input(s): CK ------------------------------------------------------------------------------------------------------------------    Component Value Date/Time   BNP 20.9 11/10/2023 1627   BNP 27.7 11/10/2023 1238    CBG: Recent Labs  Lab 12/11/23 1619 12/11/23 2129 12/12/23 0604 12/12/23 0624 12/12/23 1116  GLUCAP 201* 255* 219* 230* 304*    No results found for this or any previous visit (from the past 240 hours).   Radiology Studies: No results found.   Sigurd Pac, MD [facility] 12/09/2023 14:41  Between 7 pm - 7 am I am not available, please contact  night coverage MD/APP via Amion

## 2023-12-12 NOTE — Plan of Care (Signed)
   Problem: Coping: Goal: Ability to adjust to condition or change in health will improve Outcome: Progressing   Problem: Fluid Volume: Goal: Ability to maintain a balanced intake and output will improve Outcome: Progressing   Problem: Health Behavior/Discharge Planning: Goal: Ability to identify and utilize available resources and services will improve Outcome: Progressing

## 2023-12-12 NOTE — Progress Notes (Signed)
 Echocardiogram 2D Echocardiogram has been performed.  Jennifer [name] Moxie Kalil RDCS 12/12/2023, 12:17 PM

## 2023-12-12 NOTE — Consult Note (Signed)
 Advanced Heart Failure Team Consult Note   Primary Physician: Emilio Joesph DEL, PA-C Cardiologist:  [name], MD HF MD: Dr [name]  Reason for Consultation: Heart Failure   HPI:    Jennifer [name] is seen today for evaluation of heart failure at the request of Dr Fairy.   Jennifer Althoff 33 year old with a complex medical history including: HFmEF, Cardiomems implanted 2023, pancreatitis,  LPL deficiency due to severe hyperglycemia, hypothyroidism, type 3 DM, pancreatitis, CKD stage IV, polycystic ovary syndrome. Multiple admission for HF, AKI, and metabolic abnormalities. Most recent Echo LVEF 45-50% RV normal.   She has been followed in the HF clinic with attempts to managed fluid level with Cardiomems. She was last seen in the HF clinic August 2025. Cardiomems elevated PAD 31. She was instructed to take metolazone  2 doses and continue torsemide  60 mg daily. Unfortunately she didn't get the message. During the office visit,  Reds reading 26%.  Recommended to continue torsemide  60 mg daily.   Admitted with A/C HFrEF. CXR no acute findings. Pro BNP 433, HS Trop 77>75, sodium 127, creatinine 1.6,  WBC 5.9, and Hgb 10.8. Diuresing with IV lasix . Now with worsening renal function. Creatinine >3. Diuretics  and BB held today.   Complaining of fatigue.    Home Medications Prior to Admission medications   Medication Sig Start Date End Date Taking? Authorizing Provider  aspirin  EC 81 MG EC tablet Take 1 tablet (81 mg total) by mouth daily. Swallow whole. 12/07/20  Yes Fausto Sor A, DO  azelastine  (ASTELIN ) 0.1 % nasal spray Place 2 sprays into both nostrils daily as needed for rhinitis or allergies.   Yes [provider]  BAQSIMI ONE PACK 3 MG/DOSE POWD Place 1 Dose into the nose once as needed (hypoglycemia). 07/31/23  Yes [provider]  calcitRIOL  (ROCALTROL ) 0.25 MCG capsule Take 0.25 mcg by mouth daily. 08/04/23  Yes [provider]  carvedilol  (COREG ) 6.25  MG tablet Take 1.5 tablets (9.375 mg total) by mouth 2 (two) times daily. 08/07/23  Yes Milford, Harlene HERO, FNP  dicyclomine  (BENTYL ) 20 MG tablet Take 1 tablet (20 mg total) by mouth 2 (two) times daily. 09/05/23  Yes Dreama Longs, MD  DULoxetine  (CYMBALTA ) 60 MG capsule Take 60 mg by mouth at bedtime. 09/26/22  Yes [provider]  ergocalciferol  (VITAMIN D2) 1.25 MG (50000 UT) capsule Take 1 capsule (50,000 Units total) by mouth every Sunday. 11/12/23  Yes Lee, [place], NP  fenofibrate  54 MG tablet Take 1 tablet (54 mg total) by mouth daily. 12/01/22  Yes [name], Vinie BROCKS, MD  HUMALOG  KWIKPEN 100 UNIT/ML KwikPen Inject 60-95 Units into the skin See admin instructions. Inject 60-95 units into the skin three times a day before meals, per sliding scale   Yes [provider]  HUMULIN  R U-500 KWIKPEN 500 UNIT/ML KwikPen Inject 195-300 Units into the skin See admin instructions. 195 am bef breakfast300 hs   Yes [provider]  hydrALAZINE  (APRESOLINE ) 50 MG tablet Take 50 mg by mouth daily.   Yes [provider]  isosorbide  mononitrate (IMDUR ) 30 MG 24 hr tablet Take 1 tablet (30 mg total) by mouth daily. 11/10/23 02/08/24 Yes Lee, [place], NP  ivabradine  (CORLANOR ) 7.5 MG TABS tablet Take 1 tablet (7.5 mg total) by mouth 2 (two) times daily with a meal. 01/31/23  Yes Milford, Harlene HERO, FNP  lansoprazole  (PREVACID ) 30 MG capsule Take 1 capsule (30 mg total) by mouth 2 (two) times daily  before a meal. 10/04/23  Yes May, Deanna J, NP  levocetirizine (XYZAL ) 5 MG tablet Take 5 mg by mouth at bedtime. 03/05/22  Yes [provider]  levothyroxine  (SYNTHROID ) 150 MCG tablet Take 150 mcg by mouth daily before breakfast.   Yes [provider]  metolazone  (ZAROXOLYN ) 2.5 MG tablet Take 1 tablet (2.5 mg total) by mouth daily as needed. Patient taking differently: Take 2.5 mg by mouth daily as needed (for fluid). 12/06/23  Yes [name], Toribio SAUNDERS, MD  pregabalin  (LYRICA )  300 MG capsule Take 300 mg by mouth at bedtime.   Yes [provider]  prochlorperazine  (COMPAZINE ) 10 MG tablet Take 10 mg by mouth at bedtime. Preventative   Yes [provider]  rosuvastatin  (CRESTOR ) 10 MG tablet Take 1 tablet (10 mg total) by mouth daily. 09/04/23  Yes Sebastian Toribio GAILS, MD  torsemide  (DEMADEX ) 20 MG tablet Take 3 tablets (60 mg total) by mouth daily. 11/08/23  Yes Hackney, [name] A, FNP  tretinoin  (RETIN-A ) 0.05 % cream Apply 1 Application topically at bedtime.   Yes [provider]  TYLENOL  500 MG tablet Take 500-1,000 mg by mouth every 6 (six) hours as needed for mild pain (pain score 1-3) (or headaches).   Yes [provider]  Continuous Glucose Sensor (DEXCOM G7 SENSOR) MISC Inject 1 Device into the skin See admin instructions. Place 1 new sensor into the skin every 10 days 06/28/23   [provider]  CREON  36000-114000 units CPEP capsule Take 1 capsule (36,000 Units total) by mouth 3 (three) times daily. Patient not taking: Reported on 11/10/2023 05/10/23   Rai, Nydia POUR, MD  Glucose Blood (BLOOD GLUCOSE TEST STRIPS) STRP 1 each by Does not apply route 3 (three) times daily. Use as directed to check blood sugar. May dispense any manufacturer covered by patient's insurance and fits patient's device. 08/26/23   Shalhoub, Zachary PARAS, MD  Insulin  Pen Needle (PEN NEEDLES) 31G X 5 MM MISC Use as directed 3 (three) times daily. 08/26/23   Shalhoub, Zachary PARAS, MD  Lancet Device MISC 1 each by Does not apply route 3 (three) times daily. May dispense any manufacturer covered by patient's insurance. 08/26/23   Shalhoub, Zachary PARAS, MD  Lancets MISC 1 each by Does not apply route 3 (three) times daily. Use as directed to check blood sugar. May dispense any manufacturer covered by patient's insurance and fits patient's device. 08/26/23   Shalhoub, Zachary PARAS, MD  oxyCODONE  (ROXICODONE ) 5 MG immediate release tablet Take 1 tablet (5 mg total) by mouth every 4  (four) hours as needed for severe pain (pain score 7-10). Patient not taking: Reported on 11/10/2023 09/05/23   Dreama Longs, MD  promethazine  (PHENERGAN ) 12.5 MG tablet Take 1 tablet (12.5 mg total) by mouth every 12 (twelve) hours as needed for nausea or vomiting. Patient not taking: Reported on 12/07/2023 09/28/23   May, Deanna J, NP  Safety Seal Miscellaneous MISC Hormonic Hair Solution with minoxidil USP 7% and finasteride USP 0.05% - apply to affected areas daily every morning. 06/06/23   Alm Delon SAILOR, DO    Past Medical History: Past Medical History:  Diagnosis Date   Afib (HCC) 05/12/2021   Brain tumor (HCC) 03/29/1995   astrocytoma   CHF (congestive heart failure) (HCC)    Cholesterosis    CKD (chronic kidney disease) stage 4, GFR 15-29 ml/min (HCC) 05/13/2021   DM (diabetes mellitus) (HCC) 10/10/2018   Fatty liver    Gastroparesis due to DM (  HCC) 05/12/2021   HTN (hypertension) 10/10/2018   Hypothyroidism 10/10/2018   Lipoprotein deficiency    Lung disease    longevity long term   Pancreatitis    Polycystic ovary syndrome     Past Surgical History: Past Surgical History:  Procedure Laterality Date   ABDOMINAL SURGERY     pt states during miscarriage got her intestine   BRAIN SURGERY     ESOPHAGOGASTRODUODENOSCOPY N/A 09/01/2023   Procedure: EGD (ESOPHAGOGASTRODUODENOSCOPY);  Surgeon: Abran Norleen SAILOR, MD;  Location: [facility];  Service: Gastroenterology;  Laterality: N/A;   EYE MUSCLE SURGERY Right 03/28/2014   PRESSURE SENSOR/CARDIOMEMS N/A 12/06/2021   Procedure: PRESSURE SENSOR/CARDIOMEMS;  Surgeon: [name] Toribio SAUNDERS, MD;  Location: [facility];  Service: Cardiovascular;  Laterality: N/A;   RIGHT HEART CATH N/A 08/05/2021   Procedure: RIGHT HEART CATH;  Surgeon: [name] Toribio SAUNDERS, MD;  Location: [facility];  Service: Cardiovascular;  Laterality: N/A;   RIGHT HEART CATH N/A 12/06/2021   Procedure: RIGHT HEART CATH;  Surgeon: [name] Toribio SAUNDERS,  MD;  Location: [facility];  Service: Cardiovascular;  Laterality: N/A;   RIGHT/LEFT HEART CATH AND CORONARY ANGIOGRAPHY N/A 12/03/2020   Procedure: RIGHT/LEFT HEART CATH AND CORONARY ANGIOGRAPHY;  Surgeon: Ladona Heinz, MD;  Location: [facility];  Service: Cardiovascular;  Laterality: N/A;   VENTRICULOSTOMY  03/28/1997    Family History: Family History  Problem Relation Age of Onset   Diabetes Mother    Hypertension Mother    Hyperlipidemia Mother    Thyroid  disease Mother    Hypertension Father    Diabetes Father    Pancreatic cancer Paternal Aunt    Pancreatic cancer Paternal Uncle    Colon cancer Neg Hx    Esophageal cancer Neg Hx    Stomach cancer Neg Hx     Social History: Social History   Socioeconomic History   Marital status: Divorced    Spouse name: Not on file   Number of children: 0   Years of education: Not on file   Highest education level: Not on file  Occupational History   Occupation: Dance movement psychotherapist   Occupation: N/A  Tobacco Use   Smoking status: Never   Smokeless tobacco: Never  Vaping Use   Vaping status: Never Used  Substance and Sexual Activity   Alcohol  use: Never   Drug use: Never   Sexual activity: Yes  Other Topics Concern   Not on file  Social History Narrative   Not on file   Social Drivers of Health   Financial Resource Strain: Low Risk  (10/24/2023)   Received from [facility]   Overall Financial Resource Strain (CARDIA)    How hard is it for you to pay for the very basics like food, housing, medical care, and heating?: Not very hard  Food Insecurity: No Food Insecurity (12/07/2023)   Hunger Vital Sign    Worried About Running Out of Food in the Last Year: Never true    Ran Out of Food in the Last Year: Never true  Transportation Needs: No Transportation Needs (12/07/2023)   PRAPARE - Administrator, Civil Service (Medical): No    Lack of Transportation (Non-Medical): No  Physical Activity: Unknown  (10/24/2023)   Received from [facility]   Exercise Vital Sign    On average, how many days per week do you engage in moderate to strenuous exercise (like a brisk walk)?: Patient declined    Minutes of  Exercise per Session: Not on file  Stress: Patient Declined (10/24/2023)   Received from [facility] - Occupational Stress Questionnaire    Do you feel stress - tense, restless, nervous, or anxious, or unable to sleep at night because your mind is troubled all the time - these days?: Patient declined  Social Connections: Patient Declined (10/24/2023)   Received from [facility]   Social Network    How would you rate your social network (family, work, friends)?: Patient declined    Allergies:  Allergies  Allergen Reactions   Icosapent  Ethyl (Epa Ethyl Ester) (Fish) Hives   Ketamine Other (See Comments)    In a vegetative state for 15 minutes, per pt   Maitake Itching and Other (See Comments)    Itchy throat   Morphine  Hives, Itching, Rash and Other (See Comments)    Sore throat, too   Penicillins Hives, Itching and Rash    Has patient had a PCN reaction causing immediate rash, facial/tongue/throat swelling, SOB or lightheadedness with hypotension: Y Has patient had a PCN reaction causing severe rash involving mucus membranes or skin necrosis: Y Has patient had a PCN reaction that required hospitalization: N Has patient had a PCN reaction occurring within the last 10 years: Y   Mushroom Other (See Comments)    Pt has never had mushrooms but tested positive on allergy test   Shellfish Allergy Other (See Comments)    Pt has never had shellfish, but tested positive on allergy test. Pt states contrast in CT is okay.   Fentanyl  Nausea And Vomiting   Prednisone Rash    Objective:    Vital Signs:   Temp:  [98.1 F (36.7 C)-98.4 F (36.9 C)] 98.1 F (36.7 C) (09/16 1027) Pulse Rate:  [66-76] 72 (09/16 1027) Resp:  [18-20] 20 (09/16  1027) BP: (116-145)/(62-89) 118/73 (09/16 1027) SpO2:  [94 %-97 %] 95 % (09/16 1027) Weight:  [64.5 kg] 64.5 kg (09/16 0443) Last BM Date : 12/11/23  Weight change: Filed Weights   12/10/23 0444 12/11/23 0443 12/12/23 0443  Weight: 65.3 kg 65 kg 64.5 kg    Intake/Output:   Intake/Output Summary (Last 24 hours) at 12/12/2023 1052 Last data filed at 12/12/2023 0908 Gross per 24 hour  Intake 778.48 ml  Output 1950 ml  Net -1171.52 ml      Physical Exam  General: Appear weak  No resp difficulty Neck: JVP difficult to assess Cor: Regular rate & rhythm.  Lungs: clear Abdomen: soft, nontender, nondistended.  Extremities: no  edema Neuro: alert & oriented x3   Telemetry   SR  EKG    Sr  Labs   Basic Metabolic Panel: Recent Labs  Lab 12/08/23 1112 12/08/23 1632 12/09/23 1316 12/09/23 1803 12/09/23 2225 12/10/23 0352 12/11/23 0249 12/12/23 0342  NA 127*  --  115* 121* 114* 115* 120* 125*  K 5.7*  --  6.6* >7.5* >7.5* >7.5* 5.6* NOT DONE  CL 88*  --  79* 85* 78* 78* 82* 84*  CO2 16*  --  21* 19* 18* 20* 21* 18*  GLUCOSE 463*  --  269* 292* 321* 289* 229* 221*  BUN 76*  --  91* 99* 97* 97* 106* 125*  CREATININE 2.40*  --  2.57* 3.15* 2.61* 2.48* 2.57* 3.15*  CALCIUM  9.1  --  7.7* 7.9* 8.0* 8.0* 8.4* 8.3*  MG  --  2.8* 2.4  --   --   --   --   --  PHOS 6.4*  --   --   --   --   --   --   --     Liver Function Tests: Recent Labs  Lab 12/07/23 0018 12/08/23 1112 12/09/23 1316 12/10/23 0352  AST LIPEMIC SPECIMEN, RESULTS MAY BE AFFECTED. 105* 137* 183*  ALT LIPEMIC SPECIMEN, RESULTS MAY BE AFFECTED. 152* 116* 123*  ALKPHOS 178* 147* 120 119  BILITOT 0.5 1.1 3.1* 5.6*  PROT 7.4 7.4 RESULTS UNAVAILABLE DUE TO INTERFERING SUBSTANCE RESULTS UNAVAILABLE DUE TO INTERFERING SUBSTANCE  ALBUMIN  4.1 4.2 3.6 3.6   Recent Labs  Lab 12/07/23 0018  LIPASE 39   No results for input(s): AMMONIA in the last 168 hours.  CBC: Recent Labs  Lab 12/08/23 0245  12/09/23 0250 12/10/23 0352 12/11/23 0249 12/12/23 0342  WBC 7.6 7.7 7.7 8.0 7.0  NEUTROABS  --   --  3.5 3.5  --   HGB 13.5 11.8* 12.7 11.6* 11.3*  HCT 35.6* 32.2* 32.1* 34.9* 32.9*  MCV 90.6 90.2 89.3 89.9 87.7  PLT 148* 141* 191 160 230    Cardiac Enzymes: No results for input(s): CKTOTAL, CKMB, CKMBINDEX, TROPONINI in the last 168 hours.  BNP: BNP (last 3 results) Recent Labs    08/20/23 0551 11/10/23 1238 11/10/23 1627  BNP 278.7* 27.7 20.9    ProBNP (last 3 results) Recent Labs    12/07/23 0018  PROBNP 433.0*     CBG: Recent Labs  Lab 12/11/23 1134 12/11/23 1619 12/11/23 2129 12/12/23 0604 12/12/23 0624  GLUCAP 243* 201* 255* 219* 230*    Coagulation Studies: No results for input(s): LABPROT, INR in the last 72 hours.   Imaging   No results found.   Medications:     Current Medications:  aspirin  EC  81 mg Oral Daily   carvedilol   9.375 mg Oral BID   dicyclomine   20 mg Oral BID   DULoxetine   60 mg Oral QHS   enoxaparin  (LOVENOX ) injection  30 mg Subcutaneous Q24H   famotidine   20 mg Oral BID   fenofibrate   54 mg Oral Daily   insulin  aspart  0-15 Units Subcutaneous TID WC   insulin  aspart  15 Units Subcutaneous TID WC   insulin  glargine  35 Units Subcutaneous BID   isosorbide  mononitrate  30 mg Oral Daily   ivabradine   7.5 mg Oral BID WC   levothyroxine   150 mcg Oral Q0600   pregabalin   300 mg Oral QHS   rosuvastatin   10 mg Oral Daily    Infusions:     Patient Profile   Jennifer [name] 33 year old with a complex medical history including: HFmEF, Cardiomems implanted 2023, pancreatitis, CAD,  LPL deficiency due to severe hyperglycemia, hypothyroidism, type 3 DM, pancreatitis, CKD stage IV, polycystic ovary syndrome. Multiple admission for HF, AKI, and metabolic abnormalities. Most recent Echo LVEF 45-50% RV normal.   Admitted with HF exacerbation and hyperkalemia.   Assessment/Plan  1. A/C HFiEF Most recent Echo in May  LVEF 45-50% normal RV. Repeat Echo.  Admitted with volume overload. Pro BNP < 500. Diuresed with IV lasix  now with worsening renal function. Will need RHC to further assess and can re -calibrate cardiomems at that time.  GDMT limited by CKD/ Stage IV and h/o pancreatitis. Check lactic acid.  Hold diuretics.  Hold bb and adjust post cath. Having hypotensive episodes.  Follow renal function closely.   2. AKI on CKD stage IV  Creatinine baseline labile. ? ~ 2.5 Worsening renal function.  As above hold diuretics.  RHC tomorrow.  Avoid hypotension.   3. CAD  H/O single vessel RCA occlusion mid segment 99% with collaterals.  On aspirin  and rosuvastatin .  No chest pain. HS Trop trend flat  4. Hyponatremia  Follow closely. Restrict free water . A&O  5. Hyperkalemia Treated on admit.   6. OSA Continue CPAP  Informed Consent   Shared Decision Making/Informed Consent The risks, including but not limited to, [bleeding or vascular complications (1 in 500), pneumothorax (1 in 1600), arrhythmia (1 in 1000) and death (1 in 5000)], benefits (diagnostic support and/or management of heart failure, pulmonary hypertension) and alternatives of a right heart catheterization were discussed in detail with Jennifer. Barasch and she is willing to proceed.       Length of Stay: 5  Dallas Scorsone, NP  12/12/2023, 10:52 AM    Advanced Heart Failure Team Pager [phone] (M-F; 7a - 5p)  Please contact [facility] for night-coverage after hours (4p -7a ) and weekends on [email or web address]

## 2023-12-12 NOTE — H&P (View-Only) (Signed)
 Advanced Heart Failure Team Consult Note   Primary Physician: Emilio Joesph DEL, PA-C Cardiologist:  [name], MD HF MD: Dr [name]  Reason for Consultation: Heart Failure   HPI:    Jennifer [name] is seen today for evaluation of heart failure at the request of Dr Fairy.   Jennifer [name] 33 year old with a complex medical history including: HFmEF, Cardiomems implanted 2023, pancreatitis,  LPL deficiency due to severe hyperglycemia, hypothyroidism, type 3 DM, pancreatitis, CKD stage IV, polycystic ovary syndrome. Multiple admission for HF, AKI, and metabolic abnormalities. Most recent Echo LVEF 45-50% RV normal.   She has been followed in the HF clinic with attempts to managed fluid level with Cardiomems. She was last seen in the HF clinic August 2025. Cardiomems elevated PAD 31. She was instructed to take metolazone  2 doses and continue torsemide  60 mg daily. Unfortunately she didn't get the message. During the office visit,  Reds reading 26%.  Recommended to continue torsemide  60 mg daily.   Admitted with A/C HFrEF. CXR no acute findings. Pro BNP 433, HS Trop 77>75, sodium 127, creatinine 1.6,  WBC 5.9, and Hgb 10.8. Diuresing with IV lasix . Now with worsening renal function. Creatinine >3. Diuretics  and BB held today.   Complaining of fatigue.    Home Medications Prior to Admission medications   Medication Sig Start Date End Date Taking? Authorizing Provider  aspirin  EC 81 MG EC tablet Take 1 tablet (81 mg total) by mouth daily. Swallow whole. 12/07/20  Yes Fausto Sor A, DO  azelastine  (ASTELIN ) 0.1 % nasal spray Place 2 sprays into both nostrils daily as needed for rhinitis or allergies.   Yes [provider]  BAQSIMI ONE PACK 3 MG/DOSE POWD Place 1 Dose into the nose once as needed (hypoglycemia). 07/31/23  Yes [provider]  calcitRIOL  (ROCALTROL ) 0.25 MCG capsule Take 0.25 mcg by mouth daily. 08/04/23  Yes [provider]  carvedilol  (COREG ) 6.25  MG tablet Take 1.5 tablets (9.375 mg total) by mouth 2 (two) times daily. 08/07/23  Yes Milford, Harlene HERO, FNP  dicyclomine  (BENTYL ) 20 MG tablet Take 1 tablet (20 mg total) by mouth 2 (two) times daily. 09/05/23  Yes Dreama Longs, MD  DULoxetine  (CYMBALTA ) 60 MG capsule Take 60 mg by mouth at bedtime. 09/26/22  Yes [provider]  ergocalciferol  (VITAMIN D2) 1.25 MG (50000 UT) capsule Take 1 capsule (50,000 Units total) by mouth every Sunday. 11/12/23  Yes Lee, [place], NP  fenofibrate  54 MG tablet Take 1 tablet (54 mg total) by mouth daily. 12/01/22  Yes [name], Vinie BROCKS, MD  HUMALOG  KWIKPEN 100 UNIT/ML KwikPen Inject 60-95 Units into the skin See admin instructions. Inject 60-95 units into the skin three times a day before meals, per sliding scale   Yes [provider]  HUMULIN  R U-500 KWIKPEN 500 UNIT/ML KwikPen Inject 195-300 Units into the skin See admin instructions. 195 am bef breakfast300 hs   Yes [provider]  hydrALAZINE  (APRESOLINE ) 50 MG tablet Take 50 mg by mouth daily.   Yes [provider]  isosorbide  mononitrate (IMDUR ) 30 MG 24 hr tablet Take 1 tablet (30 mg total) by mouth daily. 11/10/23 02/08/24 Yes Lee, [place], NP  ivabradine  (CORLANOR ) 7.5 MG TABS tablet Take 1 tablet (7.5 mg total) by mouth 2 (two) times daily with a meal. 01/31/23  Yes Milford, Harlene HERO, FNP  lansoprazole  (PREVACID ) 30 MG capsule Take 1 capsule (30 mg total) by mouth 2 (two) times daily  before a meal. 10/04/23  Yes May, Deanna J, NP  levocetirizine (XYZAL ) 5 MG tablet Take 5 mg by mouth at bedtime. 03/05/22  Yes [provider]  levothyroxine  (SYNTHROID ) 150 MCG tablet Take 150 mcg by mouth daily before breakfast.   Yes [provider]  metolazone  (ZAROXOLYN ) 2.5 MG tablet Take 1 tablet (2.5 mg total) by mouth daily as needed. Patient taking differently: Take 2.5 mg by mouth daily as needed (for fluid). 12/06/23  Yes [name], Toribio SAUNDERS, MD  pregabalin  (LYRICA )  300 MG capsule Take 300 mg by mouth at bedtime.   Yes [provider]  prochlorperazine  (COMPAZINE ) 10 MG tablet Take 10 mg by mouth at bedtime. Preventative   Yes [provider]  rosuvastatin  (CRESTOR ) 10 MG tablet Take 1 tablet (10 mg total) by mouth daily. 09/04/23  Yes Sebastian Toribio GAILS, MD  torsemide  (DEMADEX ) 20 MG tablet Take 3 tablets (60 mg total) by mouth daily. 11/08/23  Yes Hackney, [name] A, FNP  tretinoin  (RETIN-A ) 0.05 % cream Apply 1 Application topically at bedtime.   Yes [provider]  TYLENOL  500 MG tablet Take 500-1,000 mg by mouth every 6 (six) hours as needed for mild pain (pain score 1-3) (or headaches).   Yes [provider]  Continuous Glucose Sensor (DEXCOM G7 SENSOR) MISC Inject 1 Device into the skin See admin instructions. Place 1 new sensor into the skin every 10 days 06/28/23   [provider]  CREON  36000-114000 units CPEP capsule Take 1 capsule (36,000 Units total) by mouth 3 (three) times daily. Patient not taking: Reported on 11/10/2023 05/10/23   Rai, Nydia POUR, MD  Glucose Blood (BLOOD GLUCOSE TEST STRIPS) STRP 1 each by Does not apply route 3 (three) times daily. Use as directed to check blood sugar. May dispense any manufacturer covered by patient's insurance and fits patient's device. 08/26/23   Shalhoub, Zachary PARAS, MD  Insulin  Pen Needle (PEN NEEDLES) 31G X 5 MM MISC Use as directed 3 (three) times daily. 08/26/23   Shalhoub, Zachary PARAS, MD  Lancet Device MISC 1 each by Does not apply route 3 (three) times daily. May dispense any manufacturer covered by patient's insurance. 08/26/23   Shalhoub, Zachary PARAS, MD  Lancets MISC 1 each by Does not apply route 3 (three) times daily. Use as directed to check blood sugar. May dispense any manufacturer covered by patient's insurance and fits patient's device. 08/26/23   Shalhoub, Zachary PARAS, MD  oxyCODONE  (ROXICODONE ) 5 MG immediate release tablet Take 1 tablet (5 mg total) by mouth every 4  (four) hours as needed for severe pain (pain score 7-10). Patient not taking: Reported on 11/10/2023 09/05/23   Dreama Longs, MD  promethazine  (PHENERGAN ) 12.5 MG tablet Take 1 tablet (12.5 mg total) by mouth every 12 (twelve) hours as needed for nausea or vomiting. Patient not taking: Reported on 12/07/2023 09/28/23   May, Deanna J, NP  Safety Seal Miscellaneous MISC Hormonic Hair Solution with minoxidil USP 7% and finasteride USP 0.05% - apply to affected areas daily every morning. 06/06/23   Alm Delon SAILOR, DO    Past Medical History: Past Medical History:  Diagnosis Date   Afib (HCC) 05/12/2021   Brain tumor (HCC) 03/29/1995   astrocytoma   CHF (congestive heart failure) (HCC)    Cholesterosis    CKD (chronic kidney disease) stage 4, GFR 15-29 ml/min (HCC) 05/13/2021   DM (diabetes mellitus) (HCC) 10/10/2018   Fatty liver    Gastroparesis due to DM (  HCC) 05/12/2021   HTN (hypertension) 10/10/2018   Hypothyroidism 10/10/2018   Lipoprotein deficiency    Lung disease    longevity long term   Pancreatitis    Polycystic ovary syndrome     Past Surgical History: Past Surgical History:  Procedure Laterality Date   ABDOMINAL SURGERY     pt states during miscarriage got her intestine   BRAIN SURGERY     ESOPHAGOGASTRODUODENOSCOPY N/A 09/01/2023   Procedure: EGD (ESOPHAGOGASTRODUODENOSCOPY);  Surgeon: Abran Norleen SAILOR, MD;  Location: [facility];  Service: Gastroenterology;  Laterality: N/A;   EYE MUSCLE SURGERY Right 03/28/2014   PRESSURE SENSOR/CARDIOMEMS N/A 12/06/2021   Procedure: PRESSURE SENSOR/CARDIOMEMS;  Surgeon: [name] Toribio SAUNDERS, MD;  Location: [facility];  Service: Cardiovascular;  Laterality: N/A;   RIGHT HEART CATH N/A 08/05/2021   Procedure: RIGHT HEART CATH;  Surgeon: [name] Toribio SAUNDERS, MD;  Location: [facility];  Service: Cardiovascular;  Laterality: N/A;   RIGHT HEART CATH N/A 12/06/2021   Procedure: RIGHT HEART CATH;  Surgeon: [name] Toribio SAUNDERS,  MD;  Location: [facility];  Service: Cardiovascular;  Laterality: N/A;   RIGHT/LEFT HEART CATH AND CORONARY ANGIOGRAPHY N/A 12/03/2020   Procedure: RIGHT/LEFT HEART CATH AND CORONARY ANGIOGRAPHY;  Surgeon: Ladona Heinz, MD;  Location: [facility];  Service: Cardiovascular;  Laterality: N/A;   VENTRICULOSTOMY  03/28/1997    Family History: Family History  Problem Relation Age of Onset   Diabetes Mother    Hypertension Mother    Hyperlipidemia Mother    Thyroid  disease Mother    Hypertension Father    Diabetes Father    Pancreatic cancer Paternal Aunt    Pancreatic cancer Paternal Uncle    Colon cancer Neg Hx    Esophageal cancer Neg Hx    Stomach cancer Neg Hx     Social History: Social History   Socioeconomic History   Marital status: Divorced    Spouse name: Not on file   Number of children: 0   Years of education: Not on file   Highest education level: Not on file  Occupational History   Occupation: Dance movement psychotherapist   Occupation: N/A  Tobacco Use   Smoking status: Never   Smokeless tobacco: Never  Vaping Use   Vaping status: Never Used  Substance and Sexual Activity   Alcohol  use: Never   Drug use: Never   Sexual activity: Yes  Other Topics Concern   Not on file  Social History Narrative   Not on file   Social Drivers of Health   Financial Resource Strain: Low Risk  (10/24/2023)   Received from [facility]   Overall Financial Resource Strain (CARDIA)    How hard is it for you to pay for the very basics like food, housing, medical care, and heating?: Not very hard  Food Insecurity: No Food Insecurity (12/07/2023)   Hunger Vital Sign    Worried About Running Out of Food in the Last Year: Never true    Ran Out of Food in the Last Year: Never true  Transportation Needs: No Transportation Needs (12/07/2023)   PRAPARE - Administrator, Civil Service (Medical): No    Lack of Transportation (Non-Medical): No  Physical Activity: Unknown  (10/24/2023)   Received from [facility]   Exercise Vital Sign    On average, how many days per week do you engage in moderate to strenuous exercise (like a brisk walk)?: Patient declined    Minutes of  Exercise per Session: Not on file  Stress: Patient Declined (10/24/2023)   Received from [facility] - Occupational Stress Questionnaire    Do you feel stress - tense, restless, nervous, or anxious, or unable to sleep at night because your mind is troubled all the time - these days?: Patient declined  Social Connections: Patient Declined (10/24/2023)   Received from [facility]   Social Network    How would you rate your social network (family, work, friends)?: Patient declined    Allergies:  Allergies  Allergen Reactions   Icosapent  Ethyl (Epa Ethyl Ester) (Fish) Hives   Ketamine Other (See Comments)    In a vegetative state for 15 minutes, per pt   Maitake Itching and Other (See Comments)    Itchy throat   Morphine  Hives, Itching, Rash and Other (See Comments)    Sore throat, too   Penicillins Hives, Itching and Rash    Has patient had a PCN reaction causing immediate rash, facial/tongue/throat swelling, SOB or lightheadedness with hypotension: Y Has patient had a PCN reaction causing severe rash involving mucus membranes or skin necrosis: Y Has patient had a PCN reaction that required hospitalization: N Has patient had a PCN reaction occurring within the last 10 years: Y   Mushroom Other (See Comments)    Pt has never had mushrooms but tested positive on allergy test   Shellfish Allergy Other (See Comments)    Pt has never had shellfish, but tested positive on allergy test. Pt states contrast in CT is okay.   Fentanyl  Nausea And Vomiting   Prednisone Rash    Objective:    Vital Signs:   Temp:  [98.1 F (36.7 C)-98.4 F (36.9 C)] 98.1 F (36.7 C) (09/16 1027) Pulse Rate:  [66-76] 72 (09/16 1027) Resp:  [18-20] 20 (09/16  1027) BP: (116-145)/(62-89) 118/73 (09/16 1027) SpO2:  [94 %-97 %] 95 % (09/16 1027) Weight:  [64.5 kg] 64.5 kg (09/16 0443) Last BM Date : 12/11/23  Weight change: Filed Weights   12/10/23 0444 12/11/23 0443 12/12/23 0443  Weight: 65.3 kg 65 kg 64.5 kg    Intake/Output:   Intake/Output Summary (Last 24 hours) at 12/12/2023 1052 Last data filed at 12/12/2023 0908 Gross per 24 hour  Intake 778.48 ml  Output 1950 ml  Net -1171.52 ml      Physical Exam  General: Appear weak  No resp difficulty Neck: JVP difficult to assess Cor: Regular rate & rhythm.  Lungs: clear Abdomen: soft, nontender, nondistended.  Extremities: no  edema Neuro: alert & oriented x3   Telemetry   SR  EKG    Sr  Labs   Basic Metabolic Panel: Recent Labs  Lab 12/08/23 1112 12/08/23 1632 12/09/23 1316 12/09/23 1803 12/09/23 2225 12/10/23 0352 12/11/23 0249 12/12/23 0342  NA 127*  --  115* 121* 114* 115* 120* 125*  K 5.7*  --  6.6* >7.5* >7.5* >7.5* 5.6* NOT DONE  CL 88*  --  79* 85* 78* 78* 82* 84*  CO2 16*  --  21* 19* 18* 20* 21* 18*  GLUCOSE 463*  --  269* 292* 321* 289* 229* 221*  BUN 76*  --  91* 99* 97* 97* 106* 125*  CREATININE 2.40*  --  2.57* 3.15* 2.61* 2.48* 2.57* 3.15*  CALCIUM  9.1  --  7.7* 7.9* 8.0* 8.0* 8.4* 8.3*  MG  --  2.8* 2.4  --   --   --   --   --  PHOS 6.4*  --   --   --   --   --   --   --     Liver Function Tests: Recent Labs  Lab 12/07/23 0018 12/08/23 1112 12/09/23 1316 12/10/23 0352  AST LIPEMIC SPECIMEN, RESULTS MAY BE AFFECTED. 105* 137* 183*  ALT LIPEMIC SPECIMEN, RESULTS MAY BE AFFECTED. 152* 116* 123*  ALKPHOS 178* 147* 120 119  BILITOT 0.5 1.1 3.1* 5.6*  PROT 7.4 7.4 RESULTS UNAVAILABLE DUE TO INTERFERING SUBSTANCE RESULTS UNAVAILABLE DUE TO INTERFERING SUBSTANCE  ALBUMIN  4.1 4.2 3.6 3.6   Recent Labs  Lab 12/07/23 0018  LIPASE 39   No results for input(s): AMMONIA in the last 168 hours.  CBC: Recent Labs  Lab 12/08/23 0245  12/09/23 0250 12/10/23 0352 12/11/23 0249 12/12/23 0342  WBC 7.6 7.7 7.7 8.0 7.0  NEUTROABS  --   --  3.5 3.5  --   HGB 13.5 11.8* 12.7 11.6* 11.3*  HCT 35.6* 32.2* 32.1* 34.9* 32.9*  MCV 90.6 90.2 89.3 89.9 87.7  PLT 148* 141* 191 160 230    Cardiac Enzymes: No results for input(s): CKTOTAL, CKMB, CKMBINDEX, TROPONINI in the last 168 hours.  BNP: BNP (last 3 results) Recent Labs    08/20/23 0551 11/10/23 1238 11/10/23 1627  BNP 278.7* 27.7 20.9    ProBNP (last 3 results) Recent Labs    12/07/23 0018  PROBNP 433.0*     CBG: Recent Labs  Lab 12/11/23 1134 12/11/23 1619 12/11/23 2129 12/12/23 0604 12/12/23 0624  GLUCAP 243* 201* 255* 219* 230*    Coagulation Studies: No results for input(s): LABPROT, INR in the last 72 hours.   Imaging   No results found.   Medications:     Current Medications:  aspirin  EC  81 mg Oral Daily   carvedilol   9.375 mg Oral BID   dicyclomine   20 mg Oral BID   DULoxetine   60 mg Oral QHS   enoxaparin  (LOVENOX ) injection  30 mg Subcutaneous Q24H   famotidine   20 mg Oral BID   fenofibrate   54 mg Oral Daily   insulin  aspart  0-15 Units Subcutaneous TID WC   insulin  aspart  15 Units Subcutaneous TID WC   insulin  glargine  35 Units Subcutaneous BID   isosorbide  mononitrate  30 mg Oral Daily   ivabradine   7.5 mg Oral BID WC   levothyroxine   150 mcg Oral Q0600   pregabalin   300 mg Oral QHS   rosuvastatin   10 mg Oral Daily    Infusions:     Patient Profile   Jennifer [name] 33 year old with a complex medical history including: HFmEF, Cardiomems implanted 2023, pancreatitis, CAD,  LPL deficiency due to severe hyperglycemia, hypothyroidism, type 3 DM, pancreatitis, CKD stage IV, polycystic ovary syndrome. Multiple admission for HF, AKI, and metabolic abnormalities. Most recent Echo LVEF 45-50% RV normal.   Admitted with HF exacerbation and hyperkalemia.   Assessment/Plan  1. A/C HFiEF Most recent Echo in May  LVEF 45-50% normal RV. Repeat Echo.  Admitted with volume overload. Pro BNP < 500. Diuresed with IV lasix  now with worsening renal function. Will need RHC to further assess and can re -calibrate cardiomems at that time.  GDMT limited by CKD/ Stage IV and h/o pancreatitis. Check lactic acid.  Hold diuretics.  Hold bb and adjust post cath. Having hypotensive episodes.  Follow renal function closely.   2. AKI on CKD stage IV  Creatinine baseline labile. ? ~ 2.5 Worsening renal function.  As above hold diuretics.  RHC tomorrow.  Avoid hypotension.   3. CAD  H/O single vessel RCA occlusion mid segment 99% with collaterals.  On aspirin  and rosuvastatin .  No chest pain. HS Trop trend flat  4. Hyponatremia  Follow closely. Restrict free water . A&O  5. Hyperkalemia Treated on admit.   6. OSA Continue CPAP  Informed Consent   Shared Decision Making/Informed Consent The risks, including but not limited to, [bleeding or vascular complications (1 in 500), pneumothorax (1 in 1600), arrhythmia (1 in 1000) and death (1 in 5000)], benefits (diagnostic support and/or management of heart failure, pulmonary hypertension) and alternatives of a right heart catheterization were discussed in detail with Jennifer. [name] and she is willing to proceed.       Length of Stay: 5  Dallas Scorsone, NP  12/12/2023, 10:52 AM    Advanced Heart Failure Team Pager [phone] (M-F; 7a - 5p)  Please contact [facility] for night-coverage after hours (4p -7a ) and weekends on [email or web address]

## 2023-12-13 ENCOUNTER — Encounter (HOSPITAL_COMMUNITY)
Admission: EM | Disposition: A | Discharge status: Home / Self Care | Payer: Self-pay | Source: Home / Self Care | Attending: Internal Medicine

## 2023-12-13 DIAGNOSIS — I5023 Acute on chronic systolic (congestive) heart failure: Secondary | ICD-10-CM | POA: Diagnosis not present

## 2023-12-13 DIAGNOSIS — E1169 Type 2 diabetes mellitus with other specified complication: Secondary | ICD-10-CM

## 2023-12-13 DIAGNOSIS — E039 Hypothyroidism, unspecified: Secondary | ICD-10-CM

## 2023-12-13 DIAGNOSIS — I5033 Acute on chronic diastolic (congestive) heart failure: Secondary | ICD-10-CM | POA: Diagnosis not present

## 2023-12-13 DIAGNOSIS — I10 Essential (primary) hypertension: Secondary | ICD-10-CM | POA: Diagnosis not present

## 2023-12-13 DIAGNOSIS — Z85841 Personal history of malignant neoplasm of brain: Secondary | ICD-10-CM

## 2023-12-13 DIAGNOSIS — N1832 Chronic kidney disease, stage 3b: Secondary | ICD-10-CM

## 2023-12-13 DIAGNOSIS — E785 Hyperlipidemia, unspecified: Secondary | ICD-10-CM

## 2023-12-13 DIAGNOSIS — K861 Other chronic pancreatitis: Secondary | ICD-10-CM | POA: Diagnosis not present

## 2023-12-13 DIAGNOSIS — N184 Chronic kidney disease, stage 4 (severe): Secondary | ICD-10-CM | POA: Diagnosis not present

## 2023-12-13 HISTORY — PX: RIGHT HEART CATH: CATH118263

## 2023-12-13 LAB — GLUCOSE, CAPILLARY
Glucose-Capillary: 198 mg/dL — ABNORMAL HIGH (ref 70–99)
Glucose-Capillary: 143 mg/dL — ABNORMAL HIGH (ref 70–99)
Glucose-Capillary: 130 mg/dL — ABNORMAL HIGH (ref 70–99)
Glucose-Capillary: 226 mg/dL — ABNORMAL HIGH (ref 70–99)

## 2023-12-13 LAB — LIPID PANEL
Cholesterol: 798 mg/dL — ABNORMAL HIGH (ref 0–200)
LDL Cholesterol: UNDETERMINED mg/dL (ref 0–99)
Triglycerides: >4425 mg/dL — ABNORMAL HIGH (ref <150)
VLDL: UNDETERMINED mg/dL (ref 0–40)

## 2023-12-13 LAB — BASIC METABOLIC PANEL WITH GFR
Anion gap: 16 — ABNORMAL HIGH (ref 5–15)
BUN: 130 mg/dL — ABNORMAL HIGH (ref 6–20)
CO2: 22 mmol/L (ref 22–32)
Calcium: 8.6 mg/dL — ABNORMAL LOW (ref 8.9–10.3)
Chloride: 83 mmol/L — ABNORMAL LOW (ref 98–111)
Creatinine, Ser: 2.88 mg/dL — ABNORMAL HIGH (ref 0.44–1.00)
GFR, Estimated: 22 mL/min — ABNORMAL LOW (ref >60)
Glucose, Bld: 229 mg/dL — ABNORMAL HIGH (ref 70–99)
Potassium: 6.1 mmol/L — ABNORMAL HIGH (ref 3.5–5.1)
Sodium: 121 mmol/L — ABNORMAL LOW (ref 135–145)

## 2023-12-13 LAB — POCT I-STAT EG7
Acid-Base Excess: 3 mmol/L — ABNORMAL HIGH (ref 0.0–2.0)
Acid-Base Excess: 5 mmol/L — ABNORMAL HIGH (ref 0.0–2.0)
Bicarbonate: 29.8 mmol/L — ABNORMAL HIGH (ref 20.0–28.0)
Bicarbonate: 31.2 mmol/L — ABNORMAL HIGH (ref 20.0–28.0)
Calcium, Ion: 1.04 mmol/L — ABNORMAL LOW (ref 1.15–1.40)
Calcium, Ion: 1.11 mmol/L — ABNORMAL LOW (ref 1.15–1.40)
HCT: 38 % (ref 36.0–46.0)
HCT: 39 % (ref 36.0–46.0)
Hemoglobin: 12.9 g/dL (ref 12.0–15.0)
Hemoglobin: 13.3 g/dL (ref 12.0–15.0)
O2 Saturation: 54 %
O2 Saturation: 55 %
Potassium: 3.5 mmol/L (ref 3.5–5.1)
Potassium: 3.7 mmol/L (ref 3.5–5.1)
Sodium: 131 mmol/L — ABNORMAL LOW (ref 135–145)
Sodium: 129 mmol/L — ABNORMAL LOW (ref 135–145)
TCO2: 31 mmol/L (ref 22–32)
TCO2: 33 mmol/L — ABNORMAL HIGH (ref 22–32)
pCO2, Ven: 52.4 mmHg (ref 44–60)
pCO2, Ven: 54.4 mmHg (ref 44–60)
pH, Ven: 7.362 (ref 7.25–7.43)
pH, Ven: 7.367 (ref 7.25–7.43)
pO2, Ven: 30 mmHg — CL (ref 32–45)
pO2, Ven: 31 mmHg — CL (ref 32–45)

## 2023-12-13 LAB — CBC
HCT: 34.3 % — ABNORMAL LOW (ref 36.0–46.0)
Hemoglobin: 12.9 g/dL (ref 12.0–15.0)
MCH: 33.3 pg (ref 26.0–34.0)
MCHC: 33 g/dL (ref 30.0–36.0)
MCV: 88.6 fL (ref 80.0–100.0)
Platelets: 209 K/uL (ref 150–400)
RBC: 3.87 MIL/uL (ref 3.87–5.11)
RDW: 15.6 % — ABNORMAL HIGH (ref 11.5–15.5)
WBC: 7.4 K/uL (ref 4.0–10.5)
nRBC: 0 % (ref 0.0–0.2)

## 2023-12-13 LAB — LACTIC ACID, PLASMA: Lactic Acid, Venous: 1.7 mmol/L (ref 0.5–1.9)

## 2023-12-13 LAB — LDL CHOLESTEROL, DIRECT: Direct LDL: 48 mg/dL (ref 0–99)

## 2023-12-13 SURGERY — RIGHT HEART CATH
Anesthesia: LOCAL

## 2023-12-13 MED ORDER — HYDRALAZINE HCL 25 MG PO TABS: 25.0000 mg | ORAL_TABLET | Freq: Two times a day (BID) | ORAL | Status: DC

## 2023-12-13 MED ORDER — HYDRALAZINE HCL 25 MG PO TABS
25.0000 mg | ORAL_TABLET | Freq: Three times a day (TID) | ORAL | Status: DC
  Administered 2023-12-13: 25 mg via ORAL
  Filled 2023-12-13: qty 1

## 2023-12-13 MED ORDER — LIDOCAINE HCL (PF) 1 % IJ SOLN
INTRAMUSCULAR | Status: DC | PRN
  Administered 2023-12-13: 2 mL

## 2023-12-13 MED ORDER — ISOSORBIDE MONONITRATE ER 30 MG PO TB24
30.0000 mg | ORAL_TABLET | Freq: Every day | ORAL | Status: DC
Start: 2023-12-13 — End: 2023-12-14
  Administered 2023-12-13 – 2023-12-14 (×2): 30 mg via ORAL
  Filled 2023-12-13 (×2): qty 1

## 2023-12-13 MED ORDER — HYDRALAZINE HCL 25 MG PO TABS
25.0000 mg | ORAL_TABLET | Freq: Once | ORAL | Status: AC
  Administered 2023-12-13: 25 mg via ORAL
  Filled 2023-12-13: qty 1

## 2023-12-13 MED ORDER — LIDOCAINE HCL (PF) 1 % IJ SOLN
INTRAMUSCULAR | Status: AC
  Filled 2023-12-13: qty 30

## 2023-12-13 MED ORDER — HYDRALAZINE HCL 50 MG PO TABS
50.0000 mg | ORAL_TABLET | Freq: Two times a day (BID) | ORAL | Status: DC
  Administered 2023-12-13: 50 mg via ORAL
  Filled 2023-12-13: qty 1

## 2023-12-13 MED ORDER — ATORVASTATIN CALCIUM 40 MG PO TABS
40.0000 mg | ORAL_TABLET | Freq: Every day | ORAL | Status: DC
  Administered 2023-12-14: 40 mg via ORAL
  Filled 2023-12-13: qty 1

## 2023-12-13 MED ORDER — PANTOPRAZOLE SODIUM 40 MG PO TBEC
40.0000 mg | DELAYED_RELEASE_TABLET | Freq: Every day | ORAL | Status: DC
  Administered 2023-12-13: 40 mg via ORAL
  Filled 2023-12-13: qty 1

## 2023-12-13 MED ORDER — HEPARIN (PORCINE) IN NACL 1000-0.9 UT/500ML-% IV SOLN
INTRAVENOUS | Status: DC | PRN
  Administered 2023-12-13 (×2): 500 mL

## 2023-12-13 SURGICAL SUPPLY — 6 items
CATH BALLN WEDGE 5F 110CM (CATHETERS) IMPLANT
CATH SWAN GANZ 7F STRAIGHT (CATHETERS) IMPLANT
GLIDESHEATH SLENDER 7FR .021G (SHEATH) IMPLANT
KIT RIGHT HEART ACIST (MISCELLANEOUS) IMPLANT
PACK CARDIAC CATHETERIZATION (CUSTOM PROCEDURE TRAY) ×1 IMPLANT
WIRE MICROINTRODUCER 60CM (WIRE) IMPLANT

## 2023-12-13 NOTE — Plan of Care (Signed)
   Problem: Coping: Goal: Ability to adjust to condition or change in health will improve Outcome: Progressing   Problem: Fluid Volume: Goal: Ability to maintain a balanced intake and output will improve Outcome: Progressing   Problem: Health Behavior/Discharge Planning: Goal: Ability to identify and utilize available resources and services will improve Outcome: Progressing

## 2023-12-13 NOTE — Progress Notes (Signed)
 Advanced Heart Failure Rounding Note  Cardiologist: [name], MD   Chief Complaint: Acute on chronic HFmrEF  Subjective:    Abdomen feels slightly distended but much better than it did on admission. No shortness of breath or orthopnea.  BP trending up.  Objective:   Weight Range: 64 kg Body mass index is 30.53 kg/m.   Vital Signs:   Temp:  [98.1 F (36.7 C)-98.8 F (37.1 C)] 98.7 F (37.1 C) (09/17 0617) Pulse Rate:  [67-78] 78 (09/17 0617) Resp:  [18-20] 18 (09/17 0617) BP: (77-145)/(51-93) 145/83 (09/17 0617) SpO2:  [91 %-97 %] 95 % (09/17 0617) FiO2 (%):  [21 %] 21 % (09/16 2350) Weight:  [64 kg] 64 kg (09/17 0617) Last BM Date : 12/12/23  Weight change: Filed Weights   12/11/23 0443 12/12/23 0443 12/13/23 0617  Weight: 65 kg 64.5 kg 64 kg    Intake/Output:   Intake/Output Summary (Last 24 hours) at 12/13/2023 0657 Last data filed at 12/12/2023 1851 Gross per 24 hour  Intake 474 ml  Output 1000 ml  Net -526 ml      Physical Exam    General:  Chronically ill appearing Cor: JVP difficult. Regular rate & rhythm. No murmurs. Lungs: Clear Abdomen: Soft, nontender, mildly distended Extremities: No lower extremity edema Neuro: Alert & orientedx3. Affect pleasant   Telemetry   SR 60s  Labs    CBC Recent Labs    12/11/23 0249 12/12/23 0342 12/13/23 0242  WBC 8.0 7.0 7.4  NEUTROABS 3.5  --   --   HGB 11.6* 11.3* 12.9  HCT 34.9* 32.9* 34.3*  MCV 89.9 87.7 88.6  PLT 160 230 209   Basic Metabolic Panel Recent Labs    90/83/74 1442 12/13/23 0242  NA 125* 121*  K SPECIMEN HEMOLYZED. HEMOLYSIS MAY AFFECT INTEGRITY OF RESULTS. 6.1*  CL 85* 83*  CO2 22 22  GLUCOSE 255* 229*  BUN 133* 130*  CREATININE 3.69* 2.88*  CALCIUM  8.2* 8.6*   Liver Function Tests No results for input(s): AST, ALT, ALKPHOS, BILITOT, PROT, ALBUMIN  in the last 72 hours. No results for input(s): LIPASE, AMYLASE in the last 72 hours. Cardiac  Enzymes No results for input(s): CKTOTAL, CKMB, CKMBINDEX, TROPONINI in the last 72 hours.  BNP: BNP (last 3 results) Recent Labs    08/20/23 0551 11/10/23 1238 11/10/23 1627  BNP 278.7* 27.7 20.9    ProBNP (last 3 results) Recent Labs    12/07/23 0018  PROBNP 433.0*     D-Dimer No results for input(s): DDIMER in the last 72 hours. Hemoglobin A1C No results for input(s): HGBA1C in the last 72 hours. Fasting Lipid Panel Recent Labs    12/13/23 0242  CHOL 798*  HDL NOT REPORTED DUE TO HIGH TRIGLYCERIDES  LDLCALC UNABLE TO CALCULATE IF TRIGLYCERIDE OVER 400 mg/dL  TRIG >5,574*  CHOLHDL NOT REPORTED DUE TO HIGH TRIGLYCERIDES   Thyroid  Function Tests No results for input(s): TSH, T4TOTAL, T3FREE, THYROIDAB in the last 72 hours.  Invalid input(s): FREET3  Other results:   Imaging    ECHOCARDIOGRAM COMPLETE Result Date: 12/12/2023    ECHOCARDIOGRAM REPORT   Patient Name:   ROSEANNA KOPLIN Date of Exam: 12/12/2023 Medical Rec #:  [id number]         Height:       57.0 in Accession #:    [id number]        Weight:       142.2 lb Date of Birth:  [birth year]  BSA:          1.556 m Patient Age:    33 years          BP:           118/73 mmHg Patient Gender: F                 HR:           69 bpm. Exam Location:  Inpatient Procedure: 2D Echo, Color Doppler and Cardiac Doppler (Both Spectral and Color            Flow Doppler were utilized during procedure). Indications:    I50.9* Heart failure (unspecified)  History:        Patient has prior history of Echocardiogram examinations, most                 recent 08/16/2023. CHF; Risk Factors:Hypertension and Diabetes.  Sonographer:    Damien Senior RDCS Referring Phys: [phone] [name] D CLEGG  Sonographer Comments: Ordered without definity . IMPRESSIONS  1. Left ventricular ejection fraction, by estimation, is 45 to 50%. The left ventricle has mildly decreased function. The left ventricle has no regional wall motion  abnormalities. There is mild concentric left ventricular hypertrophy. Left ventricular diastolic parameters are consistent with Grade II diastolic dysfunction (pseudonormalization).  2. Right ventricular systolic function is low normal. The right ventricular size is normal. There is normal pulmonary artery systolic pressure. The estimated right ventricular systolic pressure is 31.7 mmHg.  3. The mitral valve is normal in structure. Mild mitral valve regurgitation.  4. The aortic valve is normal in structure. Aortic valve regurgitation is trivial.  5. The inferior vena cava is normal in size with greater than 50% respiratory variability, suggesting right atrial pressure of 3 mmHg. FINDINGS  Left Ventricle: Left ventricular ejection fraction, by estimation, is 45 to 50%. The left ventricle has mildly decreased function. The left ventricle has no regional wall motion abnormalities. The left ventricular internal cavity size was normal in size. There is mild concentric left ventricular hypertrophy. Left ventricular diastolic function could not be evaluated due to nondiagnostic images. Left ventricular diastolic parameters are consistent with Grade II diastolic dysfunction (pseudonormalization). Right Ventricle: The right ventricular size is normal. No increase in right ventricular wall thickness. Right ventricular systolic function is low normal. There is normal pulmonary artery systolic pressure. The tricuspid regurgitant velocity is 2.68 m/s,  and with an assumed right atrial pressure of 3 mmHg, the estimated right ventricular systolic pressure is 31.7 mmHg. Left Atrium: Left atrial size was normal in size. Right Atrium: Right atrial size was normal in size. Pericardium: There is no evidence of pericardial effusion. Mitral Valve: The mitral valve is normal in structure. Mild mitral valve regurgitation. Tricuspid Valve: The tricuspid valve is normal in structure. Tricuspid valve regurgitation is mild. Aortic Valve: The  aortic valve is normal in structure. Aortic valve regurgitation is trivial. Pulmonic Valve: The pulmonic valve was grossly normal. Pulmonic valve regurgitation is mild. Aorta: The aortic root and ascending aorta are structurally normal, with no evidence of dilitation. Venous: The inferior vena cava is normal in size with greater than 50% respiratory variability, suggesting right atrial pressure of 3 mmHg. IAS/Shunts: No atrial level shunt detected by color flow Doppler.  LEFT VENTRICLE PLAX 2D LVIDd:         4.50 cm   Diastology LVIDs:         3.60 cm   LV e' medial:    4.79 cm/s LV  PW:         1.10 cm   LV E/e' medial:  17.2 LV IVS:        1.00 cm   LV e' lateral:   7.29 cm/s LVOT diam:     1.80 cm   LV E/e' lateral: 11.3 LV SV:         37 LV SV Index:   24 LVOT Area:     2.54 cm  RIGHT VENTRICLE RV S prime:     9.79 cm/s TAPSE (M-mode): 1.9 cm LEFT ATRIUM             Index        RIGHT ATRIUM           Index LA diam:        3.00 cm 1.93 cm/m   RA Area:     11.60 cm LA Vol (A2C):   35.6 ml 22.88 ml/m  RA Volume:   24.20 ml  15.55 ml/m LA Vol (A4C):   31.1 ml 19.99 ml/m LA Biplane Vol: 34.3 ml 22.04 ml/m  AORTIC VALVE LVOT Vmax:   75.00 cm/s LVOT Vmean:  57.800 cm/s LVOT VTI:    0.144 m  AORTA Ao Root diam: 2.40 cm Ao Asc diam:  2.80 cm MITRAL VALVE               TRICUSPID VALVE MV Area (PHT): 3.60 cm    TR Peak grad:   28.7 mmHg MV Decel Time: 211 msec    TR Vmax:        268.00 cm/s MV E velocity: 82.30 cm/s MV A velocity: 28.30 cm/s  SHUNTS MV E/A ratio:  2.91        Systemic VTI:  0.14 m                            Systemic Diam: 1.80 cm Aditya Sabharwal Electronically signed by Ria Commander Signature Date/Time: 12/12/2023/5:25:41 PM    Final      Medications:     Scheduled Medications:  aspirin  EC  81 mg Oral Daily   dicyclomine   20 mg Oral BID   DULoxetine   60 mg Oral QHS   enoxaparin  (LOVENOX ) injection  30 mg Subcutaneous Q24H   famotidine   20 mg Oral Daily   fenofibrate   54 mg Oral  Daily   insulin  aspart  0-15 Units Subcutaneous TID WC   insulin  aspart  15 Units Subcutaneous TID WC   insulin  glargine  40 Units Subcutaneous BID   ivabradine   7.5 mg Oral BID WC   levothyroxine   150 mcg Oral Q0600   pregabalin   300 mg Oral QHS   rosuvastatin   10 mg Oral Daily    Infusions:  sodium chloride  10 mL/hr at 12/13/23 0631    PRN Medications: acetaminophen     Patient Profile   Ms Keesey is a 33 year old with a complex medical history including: HFmrEF s/p Cardiomems 2023, pancreatitis, CAD,  LPL deficiency due to severe hyperglycemia, hypothyroidism, type 3 DM, CKD stage IV, polycystic ovary syndrome. Multiple admission for HF, AKI, and metabolic abnormalities.    Admitted with HF exacerbation and hyperkalemia.   Assessment/Plan   1. A/C HFmrEF - EF has been up and down, as low as 30-35% in the past. EF most recently 45-50% range.  - Echo this admit: EF 45-50%, mild LVH, grade II DD, RV okay, RVSP 31 mmHg - Diuresed with IV lasix  and  developed worsening renal function. RHC today to definitively assess volume/hemodynamics and potentially re-calibrate cardiomems - lactic acid 2.0 on 09/16, repeat to ensure no upward trend - GDMT limited by CKD/ Stage IV and h/o pancreatitis.  - Currently only on ivabradine  7.5 mg BID. Holding beta blocker for now. - BP creeping back up over last 24 hrs. Restart hydralazine  at 25 mg TID and imdur  30 mg daily.   2. AKI on CKD stage IV  - Creatinine has been up and down. Baseline may be 2.5.  - Scr worse with diuresis, up to 3.7 yesterday. Improved to 2.88 today.  - RHC this am as above. Holding diuretics.  3. CAD  H/O single vessel RCA occlusion mid segment 99% with collaterals.  On aspirin  and rosuvastatin .  No chest pain. HS Trop trend flat   4. Pseudohyponatremia  -In context of markedly elevated triglycerides, > 4K.   5. Pseudohyperkalemia - In setting of markedly elevated triglycerides.  - ECG not suggestive of true  hyperkalemia   6. OSA - Continue CPAP     Length of Stay: 6  Yittel Emrich N, PA-C  12/13/2023, 6:57 AM  Advanced Heart Failure Team Pager [phone] (M-F; 7a - 5p)  Please contact [facility] for night-coverage after hours (5p -7a ) and weekends on [email or web address]

## 2023-12-13 NOTE — Assessment & Plan Note (Addendum)
 AKI Hyponatremia. Hyperkalemia  Azotemia with no uremia. (Poor prognostic indicator)   Volume status has improved, at the time of discharge serum cr is 3,0 with K at 4,2 and serum bicarbonate at 24  Na 128 BUN 130.  Resume torsemide  60 mg po daily on 09.20.  Follow up as outpatient renal function and electrolytes.

## 2023-12-13 NOTE — Hospital Course (Signed)
 Mrs. Brands was admitted to the hospital with the working diagnosis of heart failure exacerbation.   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 She noted increased abdominal girth and rapid weight gain 5 lbs, she called her doctor and was advised to take metolazone  x 2 days. After one dose she had no significant improvement and decided to come to the hospital.  On her initial physical examination her blood pressure was 128/75, HR 107 RR 18 and 02 saturation 97% Lungs with no wheezing or rhonchi, heart with S1 and S2 present and regular, with no gallops, no lower extremity edema.    Na 127, K 4,2 Cl 88 bicarbonate 21 glucose 218 bun 42 cr 1,63 BNP 433 High sensitive troponin 77 and 75  Wbc 5,9 hgb 10,8 and plt 113  Urine analysis SG 1,008, protein 1+, negative leukocytes and negative hgb.   Chest radiograph with cardiomegaly and bilateral hilar vascular congestion, no effusions or infiltrates.  EKG 133 bpm, normal axis, normal intervals, qtc 494, sinus rhythm with left atrial enlargement, positive LVH, no significant ST segment or T wave changes.   Patient was placed on IV furosemide  for diuresis.  09/17 cardiac catheterization with very elevated pulmonary pressures, pre capillary.  Plan to continue medical therapy and follow up as outpatient.

## 2023-12-13 NOTE — Assessment & Plan Note (Signed)
Continue blood pressure control with hydralazine and isosorbide.

## 2023-12-13 NOTE — Assessment & Plan Note (Signed)
 Continue levothyroxine 

## 2023-12-13 NOTE — Inpatient Diabetes Management (Signed)
 Inpatient Diabetes Program Recommendations  AACE/ADA: New Consensus Statement on Inpatient Glycemic Control (2015)  Target Ranges:  Prepandial:   less than 140 mg/dL      Peak postprandial:   less than 180 mg/dL (1-2 hours)      Critically ill patients:  140 - 180 mg/dL   Lab Results  Component Value Date   GLUCAP 143 (H) 12/13/2023   HGBA1C 8.8 (H) 09/28/2023    Review of Glycemic Control  Latest Reference Range & Units 12/12/23 11:16 12/12/23 15:59 12/12/23 21:40 12/13/23 06:12 12/13/23 11:25  Glucose-Capillary 70 - 99 mg/dL 695 (H) 788 (H) 751 (H) 198 (H) 143 (H)  (H): Data is abnormally high Diabetes history: DM2 Outpatient Diabetes medications: Humulin  R U500 195 units with breakfast, Humulin  R U500 300 units in the evening, Humalog  60-95 unitsTID with meals, Dexcom G7 CGM Current orders for Inpatient glycemic control: Lantus  40 units BID, Novolog  0-15 units TID   Inpatient Diabetes Program Recommendations:    Consider increasing Novolog  20 units TID with meals for meal coverage if patient eats at least 50% of meals.  Thanks, Tinnie Minus, MSN, RNC-OB Diabetes Coordinator [phone] (8a-5p)

## 2023-12-13 NOTE — Assessment & Plan Note (Addendum)
 Echocardiogram with mild reduction in systolic function with LV EF 45 to 50%, mild LVH, grade II diastolic dysfunction (pseudo normalization EA) RV systolic function low normal, RVSP 31.7 mmHg, mild MR, left and right atriums normal size.   09/17 cardiac catheterization  RA 6 RV 46/6 PA 46/23 PCWP 13 Cardiac output 2,8 and index 1,8 (Fick)  PVR 6,4 woods.   Precapillary pulmonary hypertension.   Urine output 1000 ml Systolic blood pressure 140 mmHg range.   Holding on loop diuretic for now Continue afterload reduction with hydralazine  and isosorbide  Continue ivabradine 

## 2023-12-13 NOTE — Assessment & Plan Note (Signed)
 No abdominal pain and patient tolerating po well.

## 2023-12-13 NOTE — Plan of Care (Signed)
   Problem: Coping: Goal: Ability to adjust to condition or change in health will improve Outcome: Progressing   Problem: Health Behavior/Discharge Planning: Goal: Ability to manage health-related needs will improve Outcome: Progressing

## 2023-12-13 NOTE — Assessment & Plan Note (Signed)
 Patient was placed on insulin  sliding scale for glucose cover and monitoring with  Basal and pre meal insulin .  Continue statin therapy.

## 2023-12-13 NOTE — Interval H&P Note (Signed)
 History and Physical Interval Note:  12/13/2023 9:40 AM  Jennifer [name]  has presented today for surgery, with the diagnosis of CHF.  The various methods of treatment have been discussed with the patient and family. After consideration of risks, benefits and other options for treatment, the patient has consented to  Procedure(s): RIGHT HEART CATH (N/A) as a surgical intervention.  The patient's history has been reviewed, patient examined, no change in status, stable for surgery.  I have reviewed the patient's chart and labs.  Questions were answered to the patient's satisfaction.     [name]

## 2023-12-13 NOTE — Progress Notes (Addendum)
  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  She reports that this is typical for CHF exacerbations, she rarely swells in her legs but often gets abdominal distention and discomfort.  Reports compliance with Lasix . - Admitted, started on diuretics, limited response,  -Lasix  dose increased to 120 mg twice daily Dr.Gherghe -9/16 with worsening AKI, BUN  Assessment and Plan: * Acute on chronic diastolic CHF (congestive heart failure) (HCC) Echocardiogram with mild reduction in systolic function with LV EF 45 to 50%, mild LVH, grade II diastolic dysfunction (pseudo normalization EA) RV systolic function low normal, RVSP 31.7 mmHg, mild MR, left and right atriums normal size.   09/17 cardiac catheterization  RA 6 RV 46/6 PA 46/23 PCWP 13 Cardiac output 2,8 and index 1,8 (Fick)  PVR 6,4 woods.   Precapillary pulmonary hypertension.   Urine output 1000 ml Systolic blood pressure 140 mmHg range.   Holding on loop diuretic for now Continue afterload reduction with hydralazine  and isosorbide  Continue ivabradine    Essential hypertension Continue blood pressure control with hydralazine  and isosorbide .    CKD stage 3b, GFR 30-44 ml/min (HCC) AKI Hyponatremia. Hyperkalemia   Renal function with serum cr at 2.8 with K at 6.1 and serum bicarbonate at 22  Na 121  Repeat K is 3,5   Plan to continue to hold diuresis and follow up renal function and electrolytes  in am.   Chronic pancreatitis (HCC) No abdominal pain and patient tolerating po well.   History of  astrocytoma of brain Follow up as outpatient.   Type 2 diabetes mellitus with hyperlipidemia (HCC) Continue glucose cover and monitoring with insulin  sliding scale.  Continue basal and pre meal insulin .  Continue statin therapy.   Hypothyroidism Continue levothyroxine          Subjective: Patient is feeling better, today with no dyspnea, PND, orthopnea or edema, no chest pain   Physical Exam: Vitals:   12/13/23 0954 12/13/23 0959 12/13/23 1050 12/13/23 1300  BP: (!) 151/89 (!) 150/85 131/70 (!) 112/54  Pulse: 73 71 69 71  Resp: 16 16 16 18   Temp:   98.2 F (36.8 C) 98.4 F (36.9 C)  TempSrc:   Oral Oral  SpO2: 98% 97% 97% 95%  Weight:      Height:       Neurology awake and alert ENT with mild pallor Cardiovascular with S1 and S2 present and regular with no gallops, rubs or murmurs No JVD Respiratory with rales bilaterally with no wheezing or rhonchi  Abdomen with no distention  No lower extremity edema  Data Reviewed:    Family Communication: no family at the bedside   Disposition: Status is: Inpatient Remains inpatient appropriate because: recovering heart failure   Planned Discharge Destination: Home     Author: Elidia Toribio Furnace, MD 12/13/2023 4:19 PM  For on call review [email or web address].

## 2023-12-13 NOTE — Assessment & Plan Note (Signed)
 Follow up as outpatient

## 2023-12-13 NOTE — Assessment & Plan Note (Deleted)
 Continue glucose cover and monitoring with insulin  sliding scale.  Continue basal and pre meal insulin .  Continue statin therapy.

## 2023-12-13 NOTE — Progress Notes (Signed)
 Pt placed on CPAP at this time.   12/13/23 2244  BiPAP/CPAP/SIPAP  BiPAP/CPAP/SIPAP Pt Type Adult  BiPAP/CPAP/SIPAP Resmed  Reason BIPAP/CPAP not in use Other(comment)  Mask Type Full face mask  Dentures removed? Not applicable  Mask Size Medium  Respiratory Rate 6 breaths/min  FiO2 (%) 21 %  Flow Rate 0 lpm  Minute Ventilation 9.1  Leak 21  Peak Inspiratory Pressure (PIP) 14  Tidal Volume (Vt) 602  Patient Home Machine No  Patient Home Mask No  Patient Home Tubing No  Auto Titrate Yes  Minimum cmH2O 6 cmH2O  Maximum cmH2O 13 cmH2O  Nasal massage performed Yes  CPAP/SIPAP surface wiped down Yes  Device Plugged into RED Power Outlet Yes

## 2023-12-14 ENCOUNTER — Other Ambulatory Visit (HOSPITAL_COMMUNITY): Payer: Self-pay

## 2023-12-14 ENCOUNTER — Encounter (HOSPITAL_COMMUNITY): Payer: Self-pay | Admitting: Cardiology

## 2023-12-14 ENCOUNTER — Telehealth (HOSPITAL_COMMUNITY): Payer: Self-pay

## 2023-12-14 DIAGNOSIS — I5023 Acute on chronic systolic (congestive) heart failure: Secondary | ICD-10-CM | POA: Diagnosis not present

## 2023-12-14 DIAGNOSIS — N184 Chronic kidney disease, stage 4 (severe): Secondary | ICD-10-CM | POA: Diagnosis not present

## 2023-12-14 DIAGNOSIS — I5022 Chronic systolic (congestive) heart failure: Secondary | ICD-10-CM

## 2023-12-14 LAB — BASIC METABOLIC PANEL WITH GFR
Anion gap: 18 — ABNORMAL HIGH (ref 5–15)
BUN: 130 mg/dL — ABNORMAL HIGH (ref 6–20)
CO2: 24 mmol/L (ref 22–32)
Calcium: 9 mg/dL (ref 8.9–10.3)
Chloride: 86 mmol/L — ABNORMAL LOW (ref 98–111)
Creatinine, Ser: 3.01 mg/dL — ABNORMAL HIGH (ref 0.44–1.00)
GFR, Estimated: 20 mL/min — ABNORMAL LOW (ref >60)
Glucose, Bld: 205 mg/dL — ABNORMAL HIGH (ref 70–99)
Potassium: 4.2 mmol/L (ref 3.5–5.1)
Sodium: 128 mmol/L — ABNORMAL LOW (ref 135–145)

## 2023-12-14 LAB — GLUCOSE, CAPILLARY
Glucose-Capillary: 201 mg/dL — ABNORMAL HIGH (ref 70–99)
Glucose-Capillary: 297 mg/dL — ABNORMAL HIGH (ref 70–99)

## 2023-12-14 MED ORDER — HYDRALAZINE HCL 25 MG PO TABS: 75.0000 mg | ORAL_TABLET | Freq: Two times a day (BID) | ORAL | 0 refills | Status: DC

## 2023-12-14 MED ORDER — TORSEMIDE 20 MG PO TABS: 60.0000 mg | ORAL_TABLET | Freq: Every day | ORAL | 2 refills | Status: DC

## 2023-12-14 MED ORDER — ATORVASTATIN CALCIUM 40 MG PO TABS: 40.0000 mg | ORAL_TABLET | Freq: Every day | ORAL | 0 refills | Status: DC

## 2023-12-14 MED ORDER — TORSEMIDE 20 MG PO TABS: 60.0000 mg | ORAL_TABLET | Freq: Every day | ORAL | Status: DC

## 2023-12-14 MED ORDER — HYDRALAZINE HCL 50 MG PO TABS
75.0000 mg | ORAL_TABLET | Freq: Two times a day (BID) | ORAL | Status: DC
  Administered 2023-12-14: 75 mg via ORAL
  Filled 2023-12-14: qty 1

## 2023-12-14 NOTE — Plan of Care (Signed)
  Problem: Education: Goal: Ability to describe self-care measures that may prevent or decrease complications (Diabetes Survival Skills Education) will improve Outcome: Adequate for Discharge Goal: Individualized Educational Video(s) Outcome: Adequate for Discharge   Problem: Coping: Goal: Ability to adjust to condition or change in health will improve Outcome: Adequate for Discharge   Problem: Fluid Volume: Goal: Ability to maintain a balanced intake and output will improve Outcome: Adequate for Discharge   Problem: Health Behavior/Discharge Planning: Goal: Ability to identify and utilize available resources and services will improve Outcome: Adequate for Discharge Goal: Ability to manage health-related needs will improve Outcome: Adequate for Discharge   Problem: Metabolic: Goal: Ability to maintain appropriate glucose levels will improve Outcome: Adequate for Discharge   Problem: Nutritional: Goal: Maintenance of adequate nutrition will improve Outcome: Adequate for Discharge Goal: Progress toward achieving an optimal weight will improve Outcome: Adequate for Discharge   Problem: Skin Integrity: Goal: Risk for impaired skin integrity will decrease Outcome: Adequate for Discharge   Problem: Tissue Perfusion: Goal: Adequacy of tissue perfusion will improve Outcome: Adequate for Discharge   Problem: Education: Goal: Knowledge of General Education information will improve Description: Including pain rating scale, medication(s)/side effects and non-pharmacologic comfort measures Outcome: Adequate for Discharge   Problem: Health Behavior/Discharge Planning: Goal: Ability to manage health-related needs will improve Outcome: Adequate for Discharge   Problem: Clinical Measurements: Goal: Ability to maintain clinical measurements within normal limits will improve Outcome: Adequate for Discharge Goal: Will remain free from infection Outcome: Adequate for Discharge Goal:  Diagnostic test results will improve Outcome: Adequate for Discharge Goal: Respiratory complications will improve Outcome: Adequate for Discharge Goal: Cardiovascular complication will be avoided Outcome: Adequate for Discharge   Problem: Activity: Goal: Risk for activity intolerance will decrease Outcome: Adequate for Discharge   Problem: Nutrition: Goal: Adequate nutrition will be maintained Outcome: Adequate for Discharge   Problem: Coping: Goal: Level of anxiety will decrease Outcome: Adequate for Discharge   Problem: Elimination: Goal: Will not experience complications related to bowel motility Outcome: Adequate for Discharge Goal: Will not experience complications related to urinary retention Outcome: Adequate for Discharge   Problem: Pain Managment: Goal: General experience of comfort will improve and/or be controlled Outcome: Adequate for Discharge   Problem: Safety: Goal: Ability to remain free from injury will improve Outcome: Adequate for Discharge   Problem: Skin Integrity: Goal: Risk for impaired skin integrity will decrease Outcome: Adequate for Discharge   Problem: Education: Goal: Ability to demonstrate management of disease process will improve Outcome: Adequate for Discharge Goal: Ability to verbalize understanding of medication therapies will improve Outcome: Adequate for Discharge Goal: Individualized Educational Video(s) Outcome: Adequate for Discharge   Problem: Activity: Goal: Capacity to carry out activities will improve Outcome: Adequate for Discharge   Problem: Cardiac: Goal: Ability to achieve and maintain adequate cardiopulmonary perfusion will improve Outcome: Adequate for Discharge   Problem: Education: Goal: Understanding of CV disease, CV risk reduction, and recovery process will improve Outcome: Adequate for Discharge Goal: Individualized Educational Video(s) Outcome: Adequate for Discharge   Problem: Activity: Goal: Ability  to return to baseline activity level will improve Outcome: Adequate for Discharge   Problem: Cardiovascular: Goal: Ability to achieve and maintain adequate cardiovascular perfusion will improve Outcome: Adequate for Discharge Goal: Vascular access site(s) Level 0-1 will be maintained Outcome: Adequate for Discharge   Problem: Health Behavior/Discharge Planning: Goal: Ability to safely manage health-related needs after discharge will improve Outcome: Adequate for Discharge

## 2023-12-14 NOTE — Discharge Summary (Signed)
 Physician Discharge Summary   Patient: Jennifer [name] MRN: [id number] DOB: September 21, 1990  Admit date:     12/07/2023  Discharge date: 12/14/23  Discharge Physician: Elidia Sieving Jaid Quirion   PCP: Emilio Joesph DEL, PA-C   Recommendations at discharge:    Resume torsemide  60 mg on 12/16/23, follow up Cardiomems goal Pad  21.  Limited medical therapy due to reduced GFR, increased hydralazine  for afterload reduction, continue with isosorbide . Follow up renal function and electrolytes as outpatient in 7 days   Follow up with Joesph Emilio PA-C in 7 to 10 days.  Follow up Cardiology as outpatient.    Discharge Diagnoses: Principal Problem:   Acute on chronic diastolic CHF (congestive heart failure) (HCC) Active Problems:   Essential hypertension   CKD stage 3b, GFR 30-44 ml/min (HCC)   Chronic pancreatitis (HCC)   History of astrocytoma of brain   Type 2 diabetes mellitus with hyperlipidemia (HCC)   Hypothyroidism  Resolved Problems:   * No resolved hospital problems. [place] [facility] Course: Jennifer [name] was admitted to the hospital with the working diagnosis of heart failure exacerbation.   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 She noted increased abdominal girth and rapid weight gain 5 lbs, she called her doctor and was advised to take metolazone  x 2 days. After one dose she had no significant improvement and decided to come to the hospital.  On her initial physical examination her blood pressure was 128/75, HR 107 RR 18 and 02 saturation 97% Lungs with no wheezing or rhonchi, heart with S1 and S2 present and regular, with no gallops, no lower extremity edema.    Na 127, K 4,2 Cl 88 bicarbonate 21 glucose 218 bun 42 cr 1,63 BNP 433 High sensitive  troponin 77 and 75  Wbc 5,9 hgb 10,8 and plt 113  Urine analysis SG 1,008, protein 1+, negative leukocytes and negative hgb.   Chest radiograph with cardiomegaly and bilateral hilar vascular congestion, no effusions or infiltrates.  EKG 133 bpm, normal axis, normal intervals, qtc 494, sinus rhythm with left atrial enlargement, positive LVH, no significant ST segment or T wave changes.   Patient was placed on IV furosemide  for diuresis.  09/17 cardiac catheterization with very elevated pulmonary pressures, pre capillary.  Plan to continue medical therapy and follow up as outpatient.   Assessment and Plan: * Acute on chronic diastolic CHF (congestive heart failure) (HCC) Echocardiogram with mild reduction in systolic function with LV EF 45 to 50%, mild LVH, grade II diastolic dysfunction (pseudo normalization EA) RV systolic function low normal, RVSP 31.7 mmHg, mild MR, left and right atriums normal size.   09/17 cardiac catheterization  RA 6 RV 46/6 PA 46/23 PCWP 13 Cardiac output 2,8 and index 1,8 (Fick)  PVR 6,4 woods.   Precapillary pulmonary hypertension.   Patient was paced on IV furosemide , negative fluid balance was achieved, with improvement in her symptoms.  Patient lost about 3 Kg during this hospitalization.  Plan to continue ivabradine . Afterload reduction with hydralazine  and isosorbide .  Limited medical therapy due to reduced GFR.  Loop diuretic with torsemide  to start on 09/20.   Outpatient follow up with Cardiomems (goal Pad 21)   Essential hypertension Continue blood pressure control with hydralazine  and isosorbide .    CKD stage 3b, GFR 30-44 ml/min (HCC) AKI  Hyponatremia. Hyperkalemia  Azotemia with no uremia. (Poor prognostic indicator)   Volume status has improved, at the time of discharge serum cr is 3,0 with K at 4,2 and serum bicarbonate at 24  Na 128 BUN 130.  Resume torsemide  60 mg po daily on 09.20.  Follow up as outpatient renal function and  electrolytes.   Chronic pancreatitis (HCC) No abdominal pain and patient tolerating po well.   History of astrocytoma of brain Follow up as outpatient.   Type 2 diabetes mellitus with hyperlipidemia (HCC) Patient was placed on insulin  sliding scale for glucose cover and monitoring with  Basal and pre meal insulin .  Continue statin therapy.   Hypothyroidism Continue levothyroxine        Consultants: Cardiology  Procedures performed: right heart catheterization   Disposition: Home Diet recommendation:  Cardiac diet DISCHARGE MEDICATION: Allergies as of 12/14/2023       Reactions   Icosapent  Ethyl (epa Ethyl Ester) (fish) Hives   Ketamine Other (See Comments)   In a vegetative state for 15 minutes, per pt   Maitake Itching, Other (See Comments)   Itchy throat   Morphine  Hives, Itching, Rash, Other (See Comments)   Sore throat, too   Penicillins Hives, Itching, Rash   Has patient had a PCN reaction causing immediate rash, facial/tongue/throat swelling, SOB or lightheadedness with hypotension: Y Has patient had a PCN reaction causing severe rash involving mucus membranes or skin necrosis: Y Has patient had a PCN reaction that required hospitalization: N Has patient had a PCN reaction occurring within the last 10 years: Y   Mushroom Other (See Comments)   Pt has never had mushrooms but tested positive on allergy test   Shellfish Allergy Other (See Comments)   Pt has never had shellfish, but tested positive on allergy test. Pt states contrast in CT is okay.   Fentanyl  Nausea And Vomiting   Prednisone Rash        Medication List     STOP taking these medications    carvedilol  6.25 MG tablet Commonly known as: COREG    Creon  36000-114000 units Cpep capsule Generic drug: lipase/protease/amylase   metolazone  2.5 MG tablet Commonly known as: ZAROXOLYN    oxyCODONE  5 MG immediate release tablet Commonly known as: Roxicodone    promethazine  12.5 MG tablet Commonly  known as: PHENERGAN    rosuvastatin  10 MG tablet Commonly known as: CRESTOR        TAKE these medications    aspirin  EC 81 MG tablet Take 1 tablet (81 mg total) by mouth daily. Swallow whole.   atorvastatin  40 MG tablet Commonly known as: LIPITOR Take 1 tablet (40 mg total) by mouth daily. Start taking on: December 15, 2023   azelastine  0.1 % nasal spray Commonly known as: ASTELIN  Place 2 sprays into both nostrils daily as needed for rhinitis or allergies.   Baqsimi One Pack 3 MG/DOSE Powd Generic drug: Glucagon Place 1 Dose into the nose once as needed (hypoglycemia).   BLOOD GLUCOSE TEST STRIPS Strp 1 each by Does not apply route 3 (three) times daily. Use as directed to check blood sugar. May dispense any manufacturer covered by patient's insurance and fits patient's device.   calcitRIOL  0.25 MCG capsule Commonly known as: ROCALTROL  Take 0.25 mcg by mouth daily.   Dexcom G7 Sensor Misc Inject 1 Device into the skin See admin instructions. Place 1 new sensor into the skin every 10 days   dicyclomine  20 MG tablet Commonly known as: BENTYL  Take 1 tablet (20 mg total) by  mouth 2 (two) times daily.   DULoxetine  60 MG capsule Commonly known as: CYMBALTA  Take 60 mg by mouth at bedtime.   ergocalciferol  1.25 MG (50000 UT) capsule Commonly known as: VITAMIN D2 Take 1 capsule (50,000 Units total) by mouth every Sunday.   fenofibrate  54 MG tablet Take 1 tablet (54 mg total) by mouth daily.   HumaLOG  KwikPen 100 UNIT/ML KwikPen Generic drug: insulin  lispro Inject 60-95 Units into the skin See admin instructions. Inject 60-95 units into the skin three times a day before meals, per sliding scale   HumuLIN  R U-500 KwikPen 500 UNIT/ML KwikPen Generic drug: insulin  regular human CONCENTRATED Inject 195-300 Units into the skin See admin instructions. 195 am bef breakfast300 hs   hydrALAZINE  25 MG tablet Commonly known as: APRESOLINE  Take 3 tablets (75 mg total) by mouth 2  (two) times daily. What changed:  medication strength how much to take when to take this   isosorbide  mononitrate 30 MG 24 hr tablet Commonly known as: IMDUR  Take 1 tablet (30 mg total) by mouth daily.   ivabradine  7.5 MG Tabs tablet Commonly known as: CORLANOR  Take 1 tablet (7.5 mg total) by mouth 2 (two) times daily with a meal.   Lancet Device Misc 1 each by Does not apply route 3 (three) times daily. May dispense any manufacturer covered by patient's insurance.   Lancets Misc 1 each by Does not apply route 3 (three) times daily. Use as directed to check blood sugar. May dispense any manufacturer covered by patient's insurance and fits patient's device.   lansoprazole  30 MG capsule Commonly known as: PREVACID  Take 1 capsule (30 mg total) by mouth 2 (two) times daily before a meal.   levocetirizine 5 MG tablet Commonly known as: XYZAL  Take 5 mg by mouth at bedtime.   levothyroxine  150 MCG tablet Commonly known as: SYNTHROID  Take 150 mcg by mouth daily before breakfast.   Pen Needles 31G X 5 MM Misc Use as directed 3 (three) times daily.   pregabalin  300 MG capsule Commonly known as: LYRICA  Take 300 mg by mouth at bedtime.   prochlorperazine  10 MG tablet Commonly known as: COMPAZINE  Take 10 mg by mouth at bedtime. Preventative   Safety Seal Miscellaneous Misc Hormonic Hair Solution with minoxidil USP 7% and finasteride USP 0.05% - apply to affected areas daily every morning.   torsemide  20 MG tablet Commonly known as: DEMADEX  Take 3 tablets (60 mg total) by mouth daily. Start taking on: December 16, 2023 What changed: These instructions start on December 16, 2023. If you are unsure what to do until then, ask your doctor or other care provider.   tretinoin  0.05 % cream Commonly known as: RETIN-A  Apply 1 Application topically at bedtime.   TYLENOL  500 MG tablet Generic drug: acetaminophen  Take 500-1,000 mg by mouth every 6 (six) hours as needed for mild pain  (pain score 1-3) (or headaches).        Follow-up Information     Emilio Joesph DEL, PA-C Follow up.   Specialty: Physician Assistant Why: please call to arrange hospital follow up appt in 7-14 days Contact information: [address] [place] [address] [place] [address] [phone]         [facility] Follow up on 12/25/2023.   Specialty: Cardiology Why: [facility] 2 PM Contact information: [address] [place]  [phone]  Discharge Exam: Filed Weights   12/12/23 0443 12/13/23 0617 12/14/23 0427  Weight: 64.5 kg 64 kg 63.5 kg   BP 132/60 (BP Location: Left Arm)   Pulse 72   Temp 98.7 F (37.1 C) (Oral)   Resp 20   Ht 4' 9 (1.448 m)   Wt 63.5 kg   SpO2 97%   BMI 30.27 kg/m   Patient is feeling better, no chest pain and no dyspnea, no PND, orthopnea or abdominal distention   Neurology awake and alert ENT with mild pallor with no icterus Cardiovascular with S1 and S2 present and regular with no gallops, rubs or murmurs No JVD Respiratory with no rales or wheezing, no rhonchi  Abdomen with no distention No lower extremity edema   Condition at discharge: stable  The results of significant diagnostics from this hospitalization (including imaging, microbiology, ancillary and laboratory) are listed below for reference.   Imaging Studies: CARDIAC CATHETERIZATION Result Date: 12/13/2023 HEMODYNAMICS: RA:       6 mmHg (mean) RV:       46/, 6 mmHg PA:       46/23 mmHg (31 mean) PCWP: 13 mmHg (mean)    Estimated Fick CO/CI   2.8L/min, 1.8/min/m2    TPG  18  mmHg     PVR  6.4 Wood Units PAPi  3.8  IMPRESSION: Right heart catheterization for evaluation of volume status and calibration of cardiomems Normal left and right heart filling pressures Significant respiratory variation, but cardiomems correlates well with RHC numbers Reduced cardiac output by assumed  Fick, though PA saturation similar to previous cath in 2023 Elevated SVR of 2857dynes/sec*cm-5 RECOMMENDATIONS: Increase afterload reduction, hold diuretics today, start back orals tomorrow When evaluating cardiomems, would look at the PA tracings and use the end expiratory values when looking at [facility]   ECHOCARDIOGRAM COMPLETE Result Date: 12/12/2023    ECHOCARDIOGRAM REPORT   Patient Name:   ERNESTINA JOE Date of Exam: 12/12/2023 Medical Rec #:  [id number]         Height:       57.0 in Accession #:    [id number]        Weight:       142.2 lb Date of Birth:  05/26/1990         BSA:          1.556 m Patient Age:    33 years          BP:           118/73 mmHg Patient Gender: F                 HR:           69 bpm. Exam Location:  Inpatient Procedure: 2D Echo, Color Doppler and Cardiac Doppler (Both Spectral and Color            Flow Doppler were utilized during procedure). Indications:    I50.9* Heart failure (unspecified)  History:        Patient has prior history of Echocardiogram examinations, most                 recent 08/16/2023. CHF; Risk Factors:Hypertension and Diabetes.  Sonographer:    Damien Senior RDCS Referring Phys: [phone] [name] D CLEGG  Sonographer Comments: Ordered without definity . IMPRESSIONS  1. Left ventricular ejection fraction, by estimation, is 45 to 50%. The left ventricle has mildly decreased function. The left ventricle has no regional wall motion abnormalities. There is mild concentric left  ventricular hypertrophy. Left ventricular diastolic parameters are consistent with Grade II diastolic dysfunction (pseudonormalization).  2. Right ventricular systolic function is low normal. The right ventricular size is normal. There is normal pulmonary artery systolic pressure. The estimated right ventricular systolic pressure is 31.7 mmHg.  3. The mitral valve is normal in structure. Mild mitral valve regurgitation.  4. The aortic valve is normal in structure. Aortic valve regurgitation is trivial.   5. The inferior vena cava is normal in size with greater than 50% respiratory variability, suggesting right atrial pressure of 3 mmHg. FINDINGS  Left Ventricle: Left ventricular ejection fraction, by estimation, is 45 to 50%. The left ventricle has mildly decreased function. The left ventricle has no regional wall motion abnormalities. The left ventricular internal cavity size was normal in size. There is mild concentric left ventricular hypertrophy. Left ventricular diastolic function could not be evaluated due to nondiagnostic images. Left ventricular diastolic parameters are consistent with Grade II diastolic dysfunction (pseudonormalization). Right Ventricle: The right ventricular size is normal. No increase in right ventricular wall thickness. Right ventricular systolic function is low normal. There is normal pulmonary artery systolic pressure. The tricuspid regurgitant velocity is 2.68 m/s,  and with an assumed right atrial pressure of 3 mmHg, the estimated right ventricular systolic pressure is 31.7 mmHg. Left Atrium: Left atrial size was normal in size. Right Atrium: Right atrial size was normal in size. Pericardium: There is no evidence of pericardial effusion. Mitral Valve: The mitral valve is normal in structure. Mild mitral valve regurgitation. Tricuspid Valve: The tricuspid valve is normal in structure. Tricuspid valve regurgitation is mild. Aortic Valve: The aortic valve is normal in structure. Aortic valve regurgitation is trivial. Pulmonic Valve: The pulmonic valve was grossly normal. Pulmonic valve regurgitation is mild. Aorta: The aortic root and ascending aorta are structurally normal, with no evidence of dilitation. Venous: The inferior vena cava is normal in size with greater than 50% respiratory variability, suggesting right atrial pressure of 3 mmHg. IAS/Shunts: No atrial level shunt detected by color flow Doppler.  LEFT VENTRICLE PLAX 2D LVIDd:         4.50 cm   Diastology LVIDs:         3.60  cm   LV e' medial:    4.79 cm/s LV PW:         1.10 cm   LV E/e' medial:  17.2 LV IVS:        1.00 cm   LV e' lateral:   7.29 cm/s LVOT diam:     1.80 cm   LV E/e' lateral: 11.3 LV SV:         37 LV SV Index:   24 LVOT Area:     2.54 cm  RIGHT VENTRICLE RV S prime:     9.79 cm/s TAPSE (M-mode): 1.9 cm LEFT ATRIUM             Index        RIGHT ATRIUM           Index LA diam:        3.00 cm 1.93 cm/m   RA Area:     11.60 cm LA Vol (A2C):   35.6 ml 22.88 ml/m  RA Volume:   24.20 ml  15.55 ml/m LA Vol (A4C):   31.1 ml 19.99 ml/m LA Biplane Vol: 34.3 ml 22.04 ml/m  AORTIC VALVE LVOT Vmax:   75.00 cm/s LVOT Vmean:  57.800 cm/s LVOT VTI:    0.144 m  AORTA Ao Root diam: 2.40 cm Ao Asc diam:  2.80 cm MITRAL VALVE               TRICUSPID VALVE MV Area (PHT): 3.60 cm    TR Peak grad:   28.7 mmHg MV Decel Time: 211 msec    TR Vmax:        268.00 cm/s MV E velocity: 82.30 cm/s MV A velocity: 28.30 cm/s  SHUNTS MV E/A ratio:  2.91        Systemic VTI:  0.14 m                            Systemic Diam: 1.80 cm Aditya Sabharwal Electronically signed by Ria Commander Signature Date/Time: 12/12/2023/5:25:41 PM    Final    US  Abdomen Limited RUQ (LIVER/GB) Result Date: 12/10/2023 CLINICAL DATA:  33 year old female undergoing evaluation for liver function, ascites. EXAM: ULTRASOUND ABDOMEN LIMITED RIGHT UPPER QUADRANT COMPARISON:  CT Abdomen and Pelvis 09/05/2023. FINDINGS: Gallbladder: No gallstones or wall thickening visualized. No sonographic Murphy sign noted by sonographer. Common bile duct: Diameter: 5 mm, normal. Liver: Echogenic liver (image 12). No discrete liver lesion by ultrasound. No intrahepatic biliary ductal dilatation. Portal vein is patent on color Doppler imaging with normal direction of blood flow towards the liver. Other: Negative visible right kidney. No ascites identified on imaging of all 4 quadrants. IMPRESSION: 1. Evidence of Fatty Liver Disease. No discrete liver lesion. No ascites. 2. Negative  gallbladder and bile ducts. Electronically Signed   By: VEAR Hurst M.D.   On: 12/10/2023 07:44   DG Chest 2 View Result Date: 12/07/2023 EXAM: 2 VIEW(S) XRAY OF THE CHEST 12/07/2023 02:53:39 AM COMPARISON: None available. CLINICAL HISTORY: 141880 SOB (shortness of breath) 141880. Table formatting from the original note was not included. Reports several days of abdominal distention and suspected fluid overload. Says she has gained 5lbs this week and stomach pain has become unbearable. FINDINGS: LUNGS AND PLEURA: No focal pulmonary opacity. No pulmonary edema. No pleural effusion. No pneumothorax. HEART AND MEDIASTINUM: Stable cardiomegaly. CardioMEMS device. BONES AND SOFT TISSUES: No acute osseous abnormality. IMPRESSION: 1. No acute process. Electronically signed by: Norman Gatlin MD 12/07/2023 02:56 AM EDT RP Workstation: [id number]    Microbiology: Results for orders placed or performed during the hospital encounter of 08/29/23  Urine Culture (for pregnant, neutropenic or urologic patients or patients with an indwelling urinary catheter)     Status: Abnormal   Collection Time: 08/30/23  9:01 PM   Specimen: Urine, Clean Catch  Result Value Ref Range Status   Specimen Description   Final    URINE, CLEAN CATCH Performed at [facility], [address] [place], [place] [address]    Special Requests   Final    NONE Performed at [facility], [address], [place], [place] [address]    Culture MULTIPLE SPECIES PRESENT, SUGGEST RECOLLECTION (A)  Final   Report Status 08/31/2023 FINAL  Final    Labs: CBC: Recent Labs  Lab 12/09/23 0250 12/10/23 0352 12/11/23 0249 12/12/23 0342 12/13/23 0242 12/13/23 0957  WBC 7.7 7.7 8.0 7.0 7.4  --   NEUTROABS  --  3.5 3.5  --   --   --   HGB 11.8* 12.7 11.6* 11.3* 12.9 13.3  12.9  HCT 32.2* 32.1* 34.9* 32.9* 34.3* 39.0  38.0  MCV 90.2 89.3 89.9 87.7 88.6  --   PLT 141* 191 160  230 209  --    Basic  Metabolic Panel: Recent Labs  Lab 12/08/23 1112 12/08/23 1632 12/09/23 1316 12/09/23 1803 12/11/23 0249 12/12/23 0342 12/12/23 1442 12/13/23 0242 12/13/23 0957 12/14/23 0244  NA 127*  --  115*   < > 120* 125* 125* 121* 129*  131* 128*  K 5.7*  --  6.6*   < > 5.6* NOT DONE SPECIMEN HEMOLYZED. HEMOLYSIS MAY AFFECT INTEGRITY OF RESULTS. 6.1* 3.7  3.5 4.2  CL 88*  --  79*   < > 82* 84* 85* 83*  --  86*  CO2 16*  --  21*   < > 21* 18* 22 22  --  24  GLUCOSE 463*  --  269*   < > 229* 221* 255* 229*  --  205*  BUN 76*  --  91*   < > 106* 125* 133* 130*  --  130*  CREATININE 2.40*  --  2.57*   < > 2.57* 3.15* 3.69* 2.88*  --  3.01*  CALCIUM  9.1  --  7.7*   < > 8.4* 8.3* 8.2* 8.6*  --  9.0  MG  --  2.8* 2.4  --   --   --   --   --   --   --   PHOS 6.4*  --   --   --   --   --   --   --   --   --    < > = values in this interval not displayed.   Liver Function Tests: Recent Labs  Lab 12/08/23 1112 12/09/23 1316 12/10/23 0352  AST 105* 137* 183*  ALT 152* 116* 123*  ALKPHOS 147* 120 119  BILITOT 1.1 3.1* 5.6*  PROT 7.4 RESULTS UNAVAILABLE DUE TO INTERFERING SUBSTANCE RESULTS UNAVAILABLE DUE TO INTERFERING SUBSTANCE  ALBUMIN  4.2 3.6 3.6   CBG: Recent Labs  Lab 12/13/23 1125 12/13/23 1651 12/13/23 2118 12/14/23 0604 12/14/23 1137  GLUCAP 143* 130* 226* 201* 297*    Discharge time spent: greater than 30 minutes.  Signed: Elidia Toribio Furnace, MD [facility] 12/14/2023

## 2023-12-14 NOTE — Progress Notes (Signed)
 Per Greig Mosses, NP- place referral for Paramedicine.   Order placed.

## 2023-12-14 NOTE — Telephone Encounter (Signed)
 Received referral for pt for paramedicine. I reached out to let her know I have her and will be reaching out again on Monday to set up home visit.    Izetta Quivers, EMT-Paramedic  [phone] 12/14/2023

## 2023-12-14 NOTE — Care Management Important Message (Signed)
 Important Message  Patient Details  Name: Jennifer [name] MRN: [id number] Date of Birth: July 10, 1990   Important Message Given:  Yes - Medicare IM     Vonzell Arrie Sharps 12/14/2023, 11:32 AM

## 2023-12-14 NOTE — TOC Initial Note (Signed)
 Transition of Care [facility]) - Initial/Assessment Note    Patient Details  Name: Jennifer [name] MRN: [id number] Date of Birth: December 26, 1990  Transition [facility]) CM/SW Contact:    Justina Delcia Czar, RN Phone Number: [phone] 12/14/2023, 12:50 PM  Clinical Narrative:                 Spoke to pt at bedside. States she lives independently at home. States she uses Pharmacist, community for transportation to appts. Provided pt with taxi voucher home.   Pt states she will arrange hospital follow up appt with PCP to fit her schedule.   Has scale for daily weights and Living Better with HF booklet.   Expected Discharge Plan: Home/Self Care Barriers to Discharge: No Barriers Identified   Patient Goals and CMS Choice Patient states their goals for this hospitalization and ongoing recovery are:: return home   Choice offered to / list presented to : NA      Expected Discharge Plan and Services In-house Referral: NA Discharge Planning Services: CM Consult Post Acute Care Choice: NA Living arrangements for the past 2 months: Apartment                 DME Arranged: N/A DME Agency: NA       HH Arranged: NA          Prior Living Arrangements/Services Living arrangements for the past 2 months: Apartment Lives with:: Self Patient language and need for interpreter reviewed:: Yes Do you feel safe going back to the place where you live?: Yes      Need for Family Participation in Patient Care: No (Comment) Care giver support system in place?: No (comment) Current home services: DME (CPAP, scale) Criminal Activity/Legal Involvement Pertinent to Current Situation/Hospitalization: No - Comment as needed  Activities of Daily Living      Permission Sought/Granted Permission sought to share information with : Case Manager, Family Supports, PCP Permission granted to share information with : Yes, Verbal Permission Granted  Share Information with NAME: Brithany Whitworth  Permission granted to  share info w AGENCY: PCP  Permission granted to share info w Relationship: mother  Permission granted to share info w Contact Information: [phone]  Emotional Assessment Appearance:: Appears stated age Attitude/Demeanor/Rapport: Engaged Affect (typically observed): Accepting Orientation: : Oriented to Self, Oriented to Place, Oriented to  Time, Oriented to Situation Alcohol  / Substance Use: Not Applicable Psych Involvement: No (comment)  Admission diagnosis:  Acute exacerbation of CHF (congestive heart failure) (HCC) [I50.9] Congestive heart failure, unspecified HF chronicity, unspecified heart failure type (HCC) [I50.9] Patient Active Problem List   Diagnosis Date Noted   Acute exacerbation of CHF (congestive heart failure) (HCC) 12/07/2023   Controlled type 2 diabetes mellitus with hyperglycemia (HCC) 08/31/2023   Nausea without vomiting 08/31/2023   Epigastric pain 08/31/2023   Sleep apnea 08/30/2023   Abscess 08/30/2023   SOB (shortness of breath) 08/20/2023   Dizziness 08/20/2023   Chronic pancreatitis (HCC) 08/15/2023   Witnessed episode of apnea 06/27/2023   Pseudohyponatremia 04/10/2023   Metabolic acidosis 04/10/2023   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 coli UTI (urinary tract infection) 06/01/2022   DKA (diabetic ketoacidosis) (HCC) 06/01/2022   ASCUS of cervix with negative high risk HPV 05/31/2022   Acute on chronic pancreatitis (HCC) 05/29/2022    Hypokalemia 03/23/2022   COVID 03/15/2022   COVID-19 virus infection 03/14/2022   Hyponatremia 03/14/2022   Positive D dimer 03/14/2022   Stage 3b chronic kidney disease (CKD) (HCC) 03/14/2022   Pulmonary hypertension (HCC) 02/06/2022   NASH (nonalcoholic steatohepatitis) 02/06/2022   Obesity (BMI 30-39.9) 02/06/2022   Heart failure (HCC) 12/03/2021   Acute on chronic diastolic CHF (congestive heart failure) (HCC) 12/02/2021   Elevated troponin 12/02/2021   Pancytopenia (HCC) 12/02/2021   Amenorrhea 12/02/2021   Hepatic steatosis 09/27/2021   Hyperosmolar hyperglycemic state (HHS) (HCC) 08/26/2021   Small intestinal bacterial overgrowth (SIBO) 08/26/2021   Lower extremity edema 08/26/2021   Hyperkalemia 08/14/2021   Hx of insulin  dependent diabetes mellitus 08/14/2021   CKD stage 3b, GFR 30-44 ml/min (HCC) 05/13/2021   Gastroparesis due to DM (HCC) 05/12/2021   Abdominal distension 05/12/2021   Chest pain 03/10/2021   Acute on chronic combined systolic and diastolic CHF (congestive heart failure) (HCC) 02/09/2021   History of astrocytoma of brain 02/09/2021   Type 2 diabetes mellitus with hyperglycemia, with long-term current use of insulin  (HCC) 01/25/2021   Diarrhea    Elevated transaminase level    Orthostatic hypotension 11/28/2020   Acute combined systolic and diastolic heart failure (HCC)    Nonischemic cardiomyopathy (HCC)    Acute systolic congestive heart failure (HCC)    Elevated liver enzymes    Acute renal failure superimposed on stage 3b chronic kidney disease (HCC) 11/26/2020   Intractable abdominal pain 11/23/2020   Chronic systolic congestive heart failure (HCC) 11/23/2020   Essential hypertension 11/23/2020   Familial hypertriglyceridemia 11/23/2020   Prolonged QT interval 11/23/2020   Finger mass, left 08/12/2020   Nausea and vomiting 07/23/2020   Polycystic ovary syndrome 01/30/2020   Moderate aortic insufficiency 08/16/2019   Moderate mitral  regurgitation 08/16/2019   Lipoprotein lipase deficiency, familial 06/19/2019   Microalbuminuria due to type 1 diabetes mellitus (HCC) 06/19/2019   Type 2 diabetes mellitus with hyperlipidemia (HCC) 10/10/2018   Hypothyroidism 10/10/2018   PCP:  Emilio Joesph DEL, PA-C Pharmacy:   [facility] - [place], [place] - [address] [place] [address] Phone: [phone] Fax: [phone]     Social Drivers of Health (SDOH) Social History: SDOH Screenings   Food Insecurity: No Food Insecurity (12/07/2023)  Housing: Low Risk  (12/07/2023)  Transportation Needs: No Transportation Needs (12/07/2023)  Utilities: Not At Risk (12/07/2023)  Alcohol  Screen: Low Risk  (12/10/2021)  Depression (PHQ2-9): Low Risk  (12/10/2021)  Financial Resource Strain: Low Risk  (10/24/2023)   Received from [facility]  Physical Activity: Unknown (10/24/2023)   Received from [facility]  Social Connections: Patient Declined (10/24/2023)   Received from [facility]  Stress: Patient Declined (10/24/2023)   Received from [facility]  Tobacco Use: Low Risk  (12/07/2023)   SDOH Interventions: Transportation Interventions: Taxi Voucher Given   Readmission Risk Interventions    12/11/2023    3:37 PM 08/21/2023    2:55 PM 05/08/2023    4:21 PM  Readmission Risk Prevention Plan  Transportation Screening Complete Complete Complete  Medication Review (RN Care Manager) Complete Complete Referral to Pharmacy  PCP or Specialist  appointment within 3-5 days of discharge  Complete Complete  HRI or Home Care Consult Complete Complete Complete  SW Recovery Care/Counseling Consult  Complete Complete  Palliative Care Screening Not Applicable Not Applicable Not Applicable  Skilled Nursing Facility Not Applicable Complete Not Applicable

## 2023-12-14 NOTE — Progress Notes (Addendum)
 Advanced Heart Failure Rounding Note  Cardiologist: [name], MD   Chief Complaint: Acute on chronic HFmrEF  Subjective:   9/17- RHC normal filling pressures. SVR elevated. Started on afterload reduction.   Feels much better. No complaints. Open to HF Paramedicine re-engagement.   Objective:   Weight Range: 63.5 kg Body mass index is 30.27 kg/m.   Vital Signs:   Temp:  [98.1 F (36.7 C)-98.6 F (37 C)] 98.6 F (37 C) (09/18 0427) Pulse Rate:  [69-84] 72 (09/18 0427) Resp:  [12-20] 18 (09/18 0427) BP: (107-161)/(54-99) 132/71 (09/18 0427) SpO2:  [95 %-100 %] 95 % (09/18 0427) FiO2 (%):  [21 %] 21 % (09/17 2244) Weight:  [63.5 kg] 63.5 kg (09/18 0427) Last BM Date : 12/12/23  Weight change: Filed Weights   12/12/23 0443 12/13/23 0617 12/14/23 0427  Weight: 64.5 kg 64 kg 63.5 kg    Intake/Output:   Intake/Output Summary (Last 24 hours) at 12/14/2023 0659 Last data filed at 12/13/2023 1300 Gross per 24 hour  Intake 240 ml  Output --  Net 240 ml      Physical Exam    General:   No resp difficulty. Sitting on the side of the bed.  Neck: JVP difficult to assess.  Cor: Regular rate & rhythm.  Lungs: clear Abdomen: soft, nontender, nondistended.  Extremities: no  edema Neuro: alert & oriented x3   Telemetry   SR 60s   Labs    CBC Recent Labs    12/12/23 0342 12/13/23 0242 12/13/23 0957  WBC 7.0 7.4  --   HGB 11.3* 12.9 13.3  12.9  HCT 32.9* 34.3* 39.0  38.0  MCV 87.7 88.6  --   PLT 230 209  --    Basic Metabolic Panel Recent Labs    90/82/74 0242 12/13/23 0957 12/14/23 0244  NA 121* 129*  131* 128*  K 6.1* 3.7  3.5 4.2  CL 83*  --  86*  CO2 22  --  24  GLUCOSE 229*  --  205*  BUN 130*  --  130*  CREATININE 2.88*  --  3.01*  CALCIUM  8.6*  --  9.0   Liver Function Tests No results for input(s): AST, ALT, ALKPHOS, BILITOT, PROT, ALBUMIN  in the last 72 hours. No results for input(s): LIPASE, AMYLASE in the last  72 hours. Cardiac Enzymes No results for input(s): CKTOTAL, CKMB, CKMBINDEX, TROPONINI in the last 72 hours.  BNP: BNP (last 3 results) Recent Labs    08/20/23 0551 11/10/23 1238 11/10/23 1627  BNP 278.7* 27.7 20.9    ProBNP (last 3 results) Recent Labs    12/07/23 0018  PROBNP 433.0*     D-Dimer No results for input(s): DDIMER in the last 72 hours. Hemoglobin A1C No results for input(s): HGBA1C in the last 72 hours. Fasting Lipid Panel Recent Labs    12/13/23 0242  CHOL 798*  HDL NOT REPORTED DUE TO HIGH TRIGLYCERIDES  LDLCALC UNABLE TO CALCULATE IF TRIGLYCERIDE OVER 400 mg/dL  TRIG >5,574*  CHOLHDL NOT REPORTED DUE TO HIGH TRIGLYCERIDES  LDLDIRECT 48   Thyroid  Function Tests No results for input(s): TSH, T4TOTAL, T3FREE, THYROIDAB in the last 72 hours.  Invalid input(s): FREET3  Other results:   Imaging    CARDIAC CATHETERIZATION Result Date: 12/13/2023 HEMODYNAMICS: RA:       6 mmHg (mean) RV:       46/, 6 mmHg PA:       46/23 mmHg (31 mean) PCWP: 13  mmHg (mean)    Estimated Fick CO/CI   2.8L/min, 1.8/min/m2    TPG  18  mmHg     PVR  6.4 Wood Units PAPi  3.8  IMPRESSION: Right heart catheterization for evaluation of volume status and calibration of cardiomems Normal left and right heart filling pressures Significant respiratory variation, but cardiomems correlates well with RHC numbers Reduced cardiac output by assumed Fick, though PA saturation similar to previous cath in 2023 Elevated SVR of 2857dynes/sec*cm-5 RECOMMENDATIONS: Increase afterload reduction, hold diuretics today, start back orals tomorrow When evaluating cardiomems, would look at the PA tracings and use the end expiratory values when looking at PAd     Medications:     Scheduled Medications:  aspirin  EC  81 mg Oral Daily   atorvastatin   40 mg Oral Daily   dicyclomine   20 mg Oral BID   DULoxetine   60 mg Oral QHS   enoxaparin  (LOVENOX ) injection  30 mg Subcutaneous  Q24H   fenofibrate   54 mg Oral Daily   hydrALAZINE   50 mg Oral BID   insulin  aspart  0-15 Units Subcutaneous TID WC   insulin  aspart  15 Units Subcutaneous TID WC   insulin  glargine  40 Units Subcutaneous BID   isosorbide  mononitrate  30 mg Oral Daily   ivabradine   7.5 mg Oral BID WC   levothyroxine   150 mcg Oral Q0600   pantoprazole   40 mg Oral QHS   pregabalin   300 mg Oral QHS    Infusions:    PRN Medications: acetaminophen     Patient Profile   Jennifer [name] is a 33 year old with a complex medical history including: HFmrEF s/p Cardiomems 2023, pancreatitis, CAD,  LPL deficiency due to severe hyperglycemia, hypothyroidism, type 3 DM, CKD stage IV, polycystic ovary syndrome. Multiple admission for HF, AKI, and metabolic abnormalities.    Admitted with HF exacerbation and hyperkalemia.   Assessment/Plan   1. A/C HFmrEF - EF has been up and down, as low as 30-35% in the past. EF most recently 45-50% range.  - Echo this admit: EF 45-50%, mild LVH, grade II DD, RV okay, RVSP 31 mmHg - Diuresed with IV lasix  and developed worsening renal function. RHC today to definitively assess volume/hemodynamics and potentially re-calibrate cardiomems - lactic acid 2.0 on 09/16, repeat to ensure no upward trend -RHC normal filling pressures. Elevated SVR. CI low.  - GDMT limited by CKD/ Stage IV and h/o pancreatitis.  - Currently only on ivabradine  7.5 mg BID. Holding beta blocker with low index.  - increase hydralazine  75 mg hydralazine  twice a day  and imdur  30 mg daily. - Hold diuretics today. On Saturday would start torsemide  60 mg daily  - Will discuss Cardiomems goal today.    2. AKI on CKD stage IV  - Creatinine has been up and down. Baseline may be 2.5.  - Scr peaked at 3.7 yesterday.-->3 today. BUN 130.  Hold diuretics today.  - RHC low filling pressures.   3. CAD  H/O single vessel RCA occlusion mid segment 99% with collaterals.  On aspirin  and rosuvastatin .  . HS Trop trend  flat Previously on repatha  but insurance stopped covering. Will to [facility]    4. Pseudohyponatremia  -In context of markedly elevated triglycerides, > 4K.   5. Pseudohyperkalemia - In setting of markedly elevated triglycerides.  - ECG not suggestive of true hyperkalemia   6. OSA - Continue CPAP    Refer to HF [employer].   HF Meds Ivabradine   7.5 mg BID.  Hydralazine  75 mg hydralazine  twice a day    imdur  30 mg daily. On Saturday would start torsemide  60 mg daily  Aspirin  81 mg daily  Atorvastatin  40 mg daily    We will set up HF follow up.    Length of Stay: 7  Keara Pagliarulo, NP  12/14/2023, 6:59 AM  Advanced Heart Failure Team Pager [phone] (M-F; 7a - 5p)  Please contact [facility] for night-coverage after hours (5p -7a ) and weekends on [email or web address]

## 2023-12-18 ENCOUNTER — Telehealth (HOSPITAL_COMMUNITY): Payer: Self-pay | Admitting: Cardiology

## 2023-12-18 ENCOUNTER — Other Ambulatory Visit (HOSPITAL_COMMUNITY): Payer: Self-pay

## 2023-12-18 MED ORDER — HYDRALAZINE HCL 25 MG PO TABS: 75.0000 mg | ORAL_TABLET | Freq: Three times a day (TID) | ORAL | 6 refills | Status: DC

## 2023-12-18 MED ORDER — ASPIRIN 81 MG PO TBEC: 81.0000 mg | DELAYED_RELEASE_TABLET | Freq: Every day | ORAL | 1 refills | Status: AC

## 2023-12-18 NOTE — Telephone Encounter (Signed)
 [name] with para medicine called to report elevated b/p readings during home visit B/p at arrival 216/122 HR 120  Repeat shortly after 198/116 HR 100  Reports hydralazine  dose is 50mg  TID [place]  7.5 bid And despite being advised to stop coreg  pt has been taking 6.25 BID since discharge 9/11  Pt is otherwise asymptomatic  No CP, HA, DIZZINESS ETC   Above reviewed with [name] Clegg,NP Advised to take hydrazine 75mg  now, and change to 75 mg TID mg TID. Recheck with para medicine before weeks end   [facility] aware and voiced understanding

## 2023-12-18 NOTE — Addendum Note (Signed)
 Addended by: Janah Mcculloh, DALTON HERO on: 12/18/2023 03:00 PM   Modules accepted: Orders
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
## 2023-12-20 ENCOUNTER — Other Ambulatory Visit (HOSPITAL_COMMUNITY): Payer: Self-pay

## 2023-12-20 NOTE — Progress Notes (Signed)
 Paramedicine Encounter    Patient ID: Jennifer [name], female    DOB: [birth year], 33 y.o.   MRN: [id number]   Came out today for reassessment of b/p. The other day her b/p was extremely high so med changes were made.  [name] still missing that mid day dose of hydralazine  due to her sleep schedule-sleeps late until 10-11am and then goes to bed around 10 so its hard for her to space it out enough for TID.  She has clinic appoint on Monday.   B/p much improved.  She got her labs back from kidney doc from 9/22.   BUN-63 Creatinine-1.67 K-4.9     BP 136/74   Pulse 84   Resp 16   Wt 140 lb (63.5 kg)   SpO2 99%   BMI 30.30 kg/m  Weight yesterday-? Last visit weight-140  Patient Care Team: [name] as PCP - General (Physician Assistant) Tommas Pears, MD as PCP - Endocrinology (Endocrinology) Ladona Heinz, MD as PCP - Cardiology (Cardiology)  Patient Active Problem List   Diagnosis Date Noted   Acute exacerbation of CHF (congestive heart failure) (HCC) 12/07/2023   Controlled type 2 diabetes mellitus with hyperglycemia (HCC) 08/31/2023   Nausea without vomiting 08/31/2023   Epigastric pain 08/31/2023   Sleep apnea 08/30/2023   Abscess 08/30/2023   SOB (shortness of breath) 08/20/2023   Dizziness 08/20/2023   Chronic pancreatitis (HCC) 08/15/2023   Witnessed episode of apnea 06/27/2023   Pseudohyponatremia 04/10/2023   Metabolic acidosis 04/10/2023   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 coli UTI (urinary tract infection) 06/01/2022   DKA (diabetic ketoacidosis) (HCC) 06/01/2022   ASCUS of cervix with negative high risk HPV 05/31/2022   Acute on chronic pancreatitis (HCC) 05/29/2022   Hypokalemia 03/23/2022   COVID 03/15/2022   COVID-19 virus infection 03/14/2022   Hyponatremia 03/14/2022   Positive D dimer 03/14/2022   Stage 3b chronic kidney disease (CKD) (HCC) 03/14/2022   Pulmonary hypertension (HCC) 02/06/2022   NASH (nonalcoholic steatohepatitis) 02/06/2022   Obesity (BMI 30-39.9) 02/06/2022   Heart failure (HCC) 12/03/2021   Acute on chronic diastolic CHF (congestive heart failure) (HCC) 12/02/2021   Elevated troponin 12/02/2021   Pancytopenia (HCC) 12/02/2021   Amenorrhea 12/02/2021   Hepatic steatosis 09/27/2021   Hyperosmolar hyperglycemic state (HHS) (HCC) 08/26/2021   Small intestinal bacterial overgrowth (SIBO) 08/26/2021   Lower extremity edema 08/26/2021   Hyperkalemia 08/14/2021   Hx of insulin  dependent diabetes mellitus 08/14/2021   CKD stage 3b, GFR 30-44 ml/min (HCC) 05/13/2021   Gastroparesis due to DM (HCC) 05/12/2021   Abdominal distension 05/12/2021   Chest pain 03/10/2021   Acute on chronic combined systolic and diastolic CHF (congestive heart failure) (HCC) 02/09/2021   History of astrocytoma of brain 02/09/2021   Type 2 diabetes mellitus with hyperglycemia, with long-term current use of insulin  (HCC) 01/25/2021   Diarrhea    Elevated transaminase level    Orthostatic hypotension 11/28/2020   Acute combined systolic and diastolic heart failure (  HCC)    Nonischemic cardiomyopathy (HCC)    Acute systolic congestive heart failure (HCC)    Elevated liver enzymes    Acute renal failure superimposed on stage 3b chronic kidney disease (HCC) 11/26/2020   Intractable abdominal pain 11/23/2020   Chronic systolic congestive heart failure (HCC) 11/23/2020   Essential hypertension 11/23/2020    Familial hypertriglyceridemia 11/23/2020   Prolonged QT interval 11/23/2020   Finger mass, left 08/12/2020   Nausea and vomiting 07/23/2020   Polycystic ovary syndrome 01/30/2020   Moderate aortic insufficiency 08/16/2019   Moderate mitral regurgitation 08/16/2019   Lipoprotein lipase deficiency, familial 06/19/2019   Microalbuminuria due to type 1 diabetes mellitus (HCC) 06/19/2019   Type 2 diabetes mellitus with hyperlipidemia (HCC) 10/10/2018   Hypothyroidism 10/10/2018    Current Outpatient Medications:    aspirin  EC 81 MG tablet, Take 1 tablet (81 mg total) by mouth daily. Swallow whole., Disp: 30 tablet, Rfl: 1   atorvastatin  (LIPITOR) 40 MG tablet, Take 1 tablet (40 mg total) by mouth daily., Disp: 30 tablet, Rfl: 0   azelastine  (ASTELIN ) 0.1 % nasal spray, Place 2 sprays into both nostrils daily as needed for rhinitis or allergies., Disp: , Rfl:    BAQSIMI ONE PACK 3 MG/DOSE POWD, Place 1 Dose into the nose once as needed (hypoglycemia)., Disp: , Rfl:    calcitRIOL  (ROCALTROL ) 0.25 MCG capsule, Take 0.25 mcg by mouth daily., Disp: , Rfl:    Continuous Glucose Sensor (DEXCOM G7 SENSOR) MISC, Inject 1 Device into the skin See admin instructions. Place 1 new sensor into the skin every 10 days, Disp: , Rfl:    dicyclomine  (BENTYL ) 20 MG tablet, Take 1 tablet (20 mg total) by mouth 2 (two) times daily. (Patient taking differently: Take 20 mg by mouth daily as needed.), Disp: 20 tablet, Rfl: 0   DULoxetine  (CYMBALTA ) 60 MG capsule, Take 60 mg by mouth at bedtime., Disp: , Rfl:    ergocalciferol  (VITAMIN D2) 1.25 MG (50000 UT) capsule, Take 1 capsule (50,000 Units total) by mouth every Sunday., Disp: 4 capsule, Rfl: 0   Glucose Blood (BLOOD GLUCOSE TEST STRIPS) STRP, 1 each by Does not apply route 3 (three) times daily. Use as directed to check blood sugar. May dispense any manufacturer covered by patient's insurance and fits patient's device., Disp: 100 strip, Rfl: 0   HUMALOG  KWIKPEN 100  UNIT/ML KwikPen, Inject 60-95 Units into the skin See admin instructions. Inject 60-95 units into the skin three times a day before meals, per sliding scale (Patient taking differently: Inject 60 Units into the skin 3 (three) times daily. Inject 60-95 units into the skin three times a day before meals, per sliding scale), Disp: , Rfl:    HUMULIN  R U-500 KWIKPEN 500 UNIT/ML KwikPen, Inject 195-300 Units into the skin See admin instructions. 195 am bef breakfast300 hs (Patient taking differently: Inject 300 Units into the skin 2 (two) times daily with a meal. 195 am bef breakfast300 hs), Disp: , Rfl:    hydrALAZINE  (APRESOLINE ) 25 MG tablet, Take 3 tablets (75 mg total) by mouth 3 (three) times daily., Disp: 270 tablet, Rfl: 6   Insulin  Pen Needle (PEN NEEDLES) 31G X 5 MM MISC, Use as directed 3 (three) times daily., Disp: 100 each, Rfl: 0   isosorbide  mononitrate (IMDUR ) 30 MG 24 hr tablet, Take 1 tablet (30 mg total) by mouth daily., Disp: 90 tablet, Rfl: 3   ivabradine  (CORLANOR ) 7.5 MG TABS tablet, Take 1 tablet (7.5 mg total) by mouth  2 (two) times daily with a meal., Disp: 60 tablet, Rfl: 6   Lancet Device MISC, 1 each by Does not apply route 3 (three) times daily. May dispense any manufacturer covered by patient's insurance., Disp: 1 each, Rfl: 0   Lancets MISC, 1 each by Does not apply route 3 (three) times daily. Use as directed to check blood sugar. May dispense any manufacturer covered by patient's insurance and fits patient's device., Disp: 100 each, Rfl: 0   lansoprazole  (PREVACID ) 30 MG capsule, Take 1 capsule (30 mg total) by mouth 2 (two) times daily before a meal., Disp: 60 capsule, Rfl: 2   levocetirizine (XYZAL ) 5 MG tablet, Take 5 mg by mouth at bedtime., Disp: , Rfl:    levothyroxine  (SYNTHROID ) 150 MCG tablet, Take 150 mcg by mouth daily before breakfast., Disp: , Rfl:    pregabalin  (LYRICA ) 300 MG capsule, Take 300 mg by mouth at bedtime., Disp: , Rfl:    prochlorperazine  (COMPAZINE )  10 MG tablet, Take 10 mg by mouth at bedtime. Preventative, Disp: , Rfl:    Safety Seal Miscellaneous MISC, Hormonic Hair Solution with minoxidil USP 7% and finasteride USP 0.05% - apply to affected areas daily every morning., Disp: 30 mL, Rfl: 2   sodium bicarbonate  650 MG tablet, Take 1,300 mg by mouth daily., Disp: , Rfl:    torsemide  (DEMADEX ) 20 MG tablet, Take 3 tablets (60 mg total) by mouth daily., Disp: 90 tablet, Rfl: 2   tretinoin  (RETIN-A ) 0.05 % cream, Apply 1 Application topically at bedtime., Disp: , Rfl:    TYLENOL  500 MG tablet, Take 500-1,000 mg by mouth every 6 (six) hours as needed for mild pain (pain score 1-3) (or headaches)., Disp: , Rfl:  Allergies  Allergen Reactions   Icosapent  Ethyl (Epa Ethyl Ester) (Fish) Hives   Ketamine Other (See Comments)    In a vegetative state for 15 minutes, per pt   Maitake Itching and Other (See Comments)    Itchy throat   Morphine  Hives, Itching, Rash and Other (See Comments)    Sore throat, too   Penicillins Hives, Itching and Rash    Has patient had a PCN reaction causing immediate rash, facial/tongue/throat swelling, SOB or lightheadedness with hypotension: Y Has patient had a PCN reaction causing severe rash involving mucus membranes or skin necrosis: Y Has patient had a PCN reaction that required hospitalization: N Has patient had a PCN reaction occurring within the last 10 years: Y   Mushroom Other (See Comments)    Pt has never had mushrooms but tested positive on allergy test   Shellfish Allergy Other (See Comments)    Pt has never had shellfish, but tested positive on allergy test. Pt states contrast in CT is okay.   Fentanyl  Nausea And Vomiting   Prednisone Rash      Social History   Socioeconomic History   Marital status: Divorced    Spouse name: Not on file   Number of children: 0   Years of education: Not on file   Highest education level: Not on file  Occupational History   Occupation: Dance movement psychotherapist    Occupation: N/A  Tobacco Use   Smoking status: Never   Smokeless tobacco: Never  Vaping Use   Vaping status: Never Used  Substance and Sexual Activity   Alcohol  use: Never   Drug use: Never   Sexual activity: Yes  Other Topics Concern   Not on file  Social History Narrative   Not on file  Social Drivers of Corporate investment banker Strain: Low Risk  (10/24/2023)   Received from [facility]   Overall Financial Resource Strain (CARDIA)    How hard is it for you to pay for the very basics like food, housing, medical care, and heating?: Not very hard  Food Insecurity: No Food Insecurity (12/07/2023)   Hunger Vital Sign    Worried About Running Out of Food in the Last Year: Never true    Ran Out of Food in the Last Year: Never true  Transportation Needs: No Transportation Needs (12/07/2023)   PRAPARE - Administrator, Civil Service (Medical): No    Lack of Transportation (Non-Medical): No  Physical Activity: Unknown (10/24/2023)   Received from [facility]   Exercise Vital Sign    On average, how many days per week do you engage in moderate to strenuous exercise (like a brisk walk)?: Patient declined    Minutes of Exercise per Session: Not on file  Stress: Patient Declined (10/24/2023)   Received from [facility] - Occupational Stress Questionnaire    Do you feel stress - tense, restless, nervous, or anxious, or unable to sleep at night because your mind is troubled all the time - these days?: Patient declined  Social Connections: Patient Declined (10/24/2023)   Received from [facility]   Social Network    How would you rate your social network (family, work, friends)?: Patient declined  Intimate Partner Violence: Not At Risk (12/07/2023)   Humiliation, Afraid, Rape, and Kick questionnaire    Fear of Current or Ex-Partner: No    Emotionally Abused: No    Physically Abused: No    Sexually Abused: No    Physical  Exam      Future Appointments  Date Time Provider Department Center  12/25/2023  2:00 PM [facility] PA/NP [facility] None  02/05/2024  9:30 AM Hope Almarie ORN, NP [facility] [address]  02/05/2024  2:00 PM [facility] OP 1 [facility]  02/05/2024  3:00 PM [name], Toribio SAUNDERS, MD [facility] None  05/07/2024  3:15 PM Alm Delon SAILOR, DO [facility] None       Izetta Quivers, Paramedic [phone] [facility] Paramedic  12/20/23

## 2023-12-22 ENCOUNTER — Telehealth (HOSPITAL_COMMUNITY): Payer: Self-pay

## 2023-12-22 NOTE — Telephone Encounter (Signed)
 Called to confirm/remind patient of their appointment at [facility] on 12/25/23.   Appointment:   [] Confirmed  [x] Left mess   [] No answer/No voice mail  [] VM Full/unable to leave message  [] Phone not in service  And to bring in all medications and/or complete list.

## 2023-12-25 ENCOUNTER — Telehealth (HOSPITAL_COMMUNITY): Payer: Self-pay

## 2023-12-25 ENCOUNTER — Inpatient Hospital Stay (HOSPITAL_COMMUNITY): Admit: 2023-12-25

## 2023-12-25 NOTE — Telephone Encounter (Signed)
 Pt was sch to come to clinic today, however she had to call to resch this appoint as she overslept.  She will be seem this thurs for f/u and I will meet her there.    Izetta Quivers, EMT-Paramedic  [phone] 12/25/2023

## 2023-12-27 ENCOUNTER — Telehealth (HOSPITAL_COMMUNITY): Payer: Self-pay

## 2023-12-27 NOTE — Telephone Encounter (Signed)
 Called to confirm/remind patient of their appointment at [facility] on 12/28/23.   Appointment:   [] Confirmed  [x] Left mess   [] No answer/No voice mail  [] VM Full/unable to leave message  [] Phone not in service  And to bring in all medications and/or complete list.

## 2023-12-28 ENCOUNTER — Other Ambulatory Visit: Payer: Self-pay

## 2023-12-28 ENCOUNTER — Inpatient Hospital Stay (HOSPITAL_COMMUNITY)
Admission: EM | Admit: 2023-12-28 | Discharge: 2024-01-02 | DRG: 291 | Disposition: A | Discharge status: Home / Self Care | Attending: Internal Medicine | Admitting: Internal Medicine

## 2023-12-28 ENCOUNTER — Encounter (HOSPITAL_COMMUNITY): Payer: Self-pay

## 2023-12-28 ENCOUNTER — Emergency Department (HOSPITAL_COMMUNITY)

## 2023-12-28 ENCOUNTER — Ambulatory Visit (HOSPITAL_COMMUNITY)
Admission: RE | Admit: 2023-12-28 | Discharge: 2023-12-28 | Disposition: A | Discharge status: Other Acute Inpatient Hospital | Source: Ambulatory Visit | Attending: Cardiology | Admitting: Cardiology

## 2023-12-28 ENCOUNTER — Other Ambulatory Visit (HOSPITAL_COMMUNITY): Payer: Self-pay

## 2023-12-28 VITALS — BP 208/104 | HR 135 | Wt 146.0 lb

## 2023-12-28 DIAGNOSIS — Z85841 Personal history of malignant neoplasm of brain: Secondary | ICD-10-CM | POA: Insufficient documentation

## 2023-12-28 DIAGNOSIS — R234 Changes in skin texture: Secondary | ICD-10-CM | POA: Diagnosis present

## 2023-12-28 DIAGNOSIS — E785 Hyperlipidemia, unspecified: Secondary | ICD-10-CM | POA: Diagnosis not present

## 2023-12-28 DIAGNOSIS — G8194 Hemiplegia, unspecified affecting left nondominant side: Secondary | ICD-10-CM | POA: Insufficient documentation

## 2023-12-28 DIAGNOSIS — T50916A Underdosing of multiple unspecified drugs, medicaments and biological substances, initial encounter: Secondary | ICD-10-CM | POA: Insufficient documentation

## 2023-12-28 DIAGNOSIS — K3184 Gastroparesis: Secondary | ICD-10-CM | POA: Diagnosis present

## 2023-12-28 DIAGNOSIS — I160 Hypertensive urgency: Secondary | ICD-10-CM | POA: Diagnosis present

## 2023-12-28 DIAGNOSIS — E8779 Other fluid overload: Secondary | ICD-10-CM

## 2023-12-28 DIAGNOSIS — I361 Nonrheumatic tricuspid (valve) insufficiency: Secondary | ICD-10-CM | POA: Insufficient documentation

## 2023-12-28 DIAGNOSIS — K760 Fatty (change of) liver, not elsewhere classified: Secondary | ICD-10-CM | POA: Insufficient documentation

## 2023-12-28 DIAGNOSIS — E1122 Type 2 diabetes mellitus with diabetic chronic kidney disease: Secondary | ICD-10-CM | POA: Diagnosis present

## 2023-12-28 DIAGNOSIS — Z91128 Patient's intentional underdosing of medication regimen for other reason: Secondary | ICD-10-CM | POA: Insufficient documentation

## 2023-12-28 DIAGNOSIS — E1143 Type 2 diabetes mellitus with diabetic autonomic (poly)neuropathy: Secondary | ICD-10-CM | POA: Diagnosis present

## 2023-12-28 DIAGNOSIS — K861 Other chronic pancreatitis: Secondary | ICD-10-CM | POA: Diagnosis present

## 2023-12-28 DIAGNOSIS — G4730 Sleep apnea, unspecified: Secondary | ICD-10-CM | POA: Diagnosis present

## 2023-12-28 DIAGNOSIS — E786 Lipoprotein deficiency: Secondary | ICD-10-CM | POA: Insufficient documentation

## 2023-12-28 DIAGNOSIS — Z79899 Other long term (current) drug therapy: Secondary | ICD-10-CM | POA: Diagnosis not present

## 2023-12-28 DIAGNOSIS — Z794 Long term (current) use of insulin: Secondary | ICD-10-CM | POA: Diagnosis not present

## 2023-12-28 DIAGNOSIS — E1165 Type 2 diabetes mellitus with hyperglycemia: Secondary | ICD-10-CM | POA: Diagnosis present

## 2023-12-28 DIAGNOSIS — I10 Essential (primary) hypertension: Secondary | ICD-10-CM | POA: Diagnosis not present

## 2023-12-28 DIAGNOSIS — I5043 Acute on chronic combined systolic (congestive) and diastolic (congestive) heart failure: Secondary | ICD-10-CM | POA: Diagnosis present

## 2023-12-28 DIAGNOSIS — N179 Acute kidney failure, unspecified: Secondary | ICD-10-CM | POA: Diagnosis present

## 2023-12-28 DIAGNOSIS — Z7982 Long term (current) use of aspirin: Secondary | ICD-10-CM

## 2023-12-28 DIAGNOSIS — G8929 Other chronic pain: Secondary | ICD-10-CM | POA: Diagnosis present

## 2023-12-28 DIAGNOSIS — N1832 Chronic kidney disease, stage 3b: Secondary | ICD-10-CM | POA: Diagnosis present

## 2023-12-28 DIAGNOSIS — R000 Tachycardia, unspecified: Principal | ICD-10-CM | POA: Diagnosis present

## 2023-12-28 DIAGNOSIS — I428 Other cardiomyopathies: Secondary | ICD-10-CM | POA: Insufficient documentation

## 2023-12-28 DIAGNOSIS — I2510 Atherosclerotic heart disease of native coronary artery without angina pectoris: Secondary | ICD-10-CM | POA: Diagnosis present

## 2023-12-28 DIAGNOSIS — E781 Pure hyperglyceridemia: Secondary | ICD-10-CM | POA: Diagnosis not present

## 2023-12-28 DIAGNOSIS — I130 Hypertensive heart and chronic kidney disease with heart failure and stage 1 through stage 4 chronic kidney disease, or unspecified chronic kidney disease: Secondary | ICD-10-CM | POA: Insufficient documentation

## 2023-12-28 DIAGNOSIS — N184 Chronic kidney disease, stage 4 (severe): Secondary | ICD-10-CM | POA: Insufficient documentation

## 2023-12-28 DIAGNOSIS — Z79890 Hormone replacement therapy (postmenopausal): Secondary | ICD-10-CM

## 2023-12-28 DIAGNOSIS — E88819 Insulin resistance, unspecified: Secondary | ICD-10-CM | POA: Diagnosis present

## 2023-12-28 DIAGNOSIS — Z8349 Family history of other endocrine, nutritional and metabolic diseases: Secondary | ICD-10-CM

## 2023-12-28 DIAGNOSIS — E7849 Other hyperlipidemia: Secondary | ICD-10-CM | POA: Diagnosis present

## 2023-12-28 DIAGNOSIS — Z83438 Family history of other disorder of lipoprotein metabolism and other lipidemia: Secondary | ICD-10-CM

## 2023-12-28 DIAGNOSIS — G4733 Obstructive sleep apnea (adult) (pediatric): Secondary | ICD-10-CM | POA: Diagnosis present

## 2023-12-28 DIAGNOSIS — T888XXA Other specified complications of surgical and medical care, not elsewhere classified, initial encounter: Secondary | ICD-10-CM | POA: Insufficient documentation

## 2023-12-28 DIAGNOSIS — Z880 Allergy status to penicillin: Secondary | ICD-10-CM

## 2023-12-28 DIAGNOSIS — I5020 Unspecified systolic (congestive) heart failure: Secondary | ICD-10-CM

## 2023-12-28 DIAGNOSIS — Z833 Family history of diabetes mellitus: Secondary | ICD-10-CM | POA: Diagnosis not present

## 2023-12-28 DIAGNOSIS — E871 Hypo-osmolality and hyponatremia: Secondary | ICD-10-CM | POA: Diagnosis present

## 2023-12-28 DIAGNOSIS — I272 Pulmonary hypertension, unspecified: Secondary | ICD-10-CM | POA: Insufficient documentation

## 2023-12-28 DIAGNOSIS — I5022 Chronic systolic (congestive) heart failure: Secondary | ICD-10-CM | POA: Diagnosis not present

## 2023-12-28 DIAGNOSIS — E039 Hypothyroidism, unspecified: Secondary | ICD-10-CM | POA: Diagnosis present

## 2023-12-28 DIAGNOSIS — F32A Depression, unspecified: Secondary | ICD-10-CM | POA: Diagnosis present

## 2023-12-28 DIAGNOSIS — E11649 Type 2 diabetes mellitus with hypoglycemia without coma: Secondary | ICD-10-CM | POA: Diagnosis not present

## 2023-12-28 DIAGNOSIS — I5023 Acute on chronic systolic (congestive) heart failure: Secondary | ICD-10-CM | POA: Insufficient documentation

## 2023-12-28 DIAGNOSIS — Z91013 Allergy to seafood: Secondary | ICD-10-CM

## 2023-12-28 DIAGNOSIS — Z888 Allergy status to other drugs, medicaments and biological substances status: Secondary | ICD-10-CM

## 2023-12-28 DIAGNOSIS — I5033 Acute on chronic diastolic (congestive) heart failure: Secondary | ICD-10-CM | POA: Diagnosis present

## 2023-12-28 DIAGNOSIS — Z8249 Family history of ischemic heart disease and other diseases of the circulatory system: Secondary | ICD-10-CM | POA: Diagnosis not present

## 2023-12-28 DIAGNOSIS — Z885 Allergy status to narcotic agent status: Secondary | ICD-10-CM

## 2023-12-28 DIAGNOSIS — Z91148 Patient's other noncompliance with medication regimen for other reason: Secondary | ICD-10-CM

## 2023-12-28 DIAGNOSIS — E1169 Type 2 diabetes mellitus with other specified complication: Secondary | ICD-10-CM | POA: Diagnosis present

## 2023-12-28 DIAGNOSIS — L089 Local infection of the skin and subcutaneous tissue, unspecified: Secondary | ICD-10-CM

## 2023-12-28 DIAGNOSIS — E1322 Other specified diabetes mellitus with diabetic chronic kidney disease: Secondary | ICD-10-CM | POA: Insufficient documentation

## 2023-12-28 DIAGNOSIS — Y838 Other surgical procedures as the cause of abnormal reaction of the patient, or of later complication, without mention of misadventure at the time of the procedure: Secondary | ICD-10-CM | POA: Insufficient documentation

## 2023-12-28 DIAGNOSIS — E1343 Other specified diabetes mellitus with diabetic autonomic (poly)neuropathy: Secondary | ICD-10-CM | POA: Insufficient documentation

## 2023-12-28 LAB — I-STAT CHEM 8, ED
BUN: 67 mg/dL — ABNORMAL HIGH (ref 6–20)
Calcium, Ion: 1.15 mmol/L (ref 1.15–1.40)
Chloride: 104 mmol/L (ref 98–111)
Creatinine, Ser: 2.1 mg/dL — ABNORMAL HIGH (ref 0.44–1.00)
Glucose, Bld: 152 mg/dL — ABNORMAL HIGH (ref 70–99)
HCT: 35 % — ABNORMAL LOW (ref 36.0–46.0)
Hemoglobin: 11.9 g/dL — ABNORMAL LOW (ref 12.0–15.0)
Potassium: 4.7 mmol/L (ref 3.5–5.1)
Sodium: 135 mmol/L (ref 135–145)
TCO2: 26 mmol/L (ref 22–32)

## 2023-12-28 LAB — CBC
HCT: 34.3 % — ABNORMAL LOW (ref 36.0–46.0)
Hemoglobin: 11.4 g/dL — ABNORMAL LOW (ref 12.0–15.0)
MCH: 29.5 pg (ref 26.0–34.0)
MCHC: 33.2 g/dL (ref 30.0–36.0)
MCV: 88.6 fL (ref 80.0–100.0)
Platelets: 281 K/uL (ref 150–400)
RBC: 3.87 MIL/uL (ref 3.87–5.11)
RDW: 16.2 % — ABNORMAL HIGH (ref 11.5–15.5)
WBC: 10.4 K/uL (ref 4.0–10.5)
nRBC: 0.4 % — ABNORMAL HIGH (ref 0.0–0.2)

## 2023-12-28 LAB — GLUCOSE, CAPILLARY: Glucose-Capillary: 375 mg/dL — ABNORMAL HIGH (ref 70–99)

## 2023-12-28 LAB — TROPONIN I (HIGH SENSITIVITY)
Troponin I (High Sensitivity): 21 ng/L — ABNORMAL HIGH (ref <18)
Troponin I (High Sensitivity): 20 ng/L — ABNORMAL HIGH (ref <18)

## 2023-12-28 LAB — HCG, SERUM, QUALITATIVE: Preg, Serum: NEGATIVE

## 2023-12-28 LAB — BRAIN NATRIURETIC PEPTIDE: B Natriuretic Peptide: 37.4 pg/mL (ref 0.0–100.0)

## 2023-12-28 MED ORDER — INSULIN GLARGINE 100 UNIT/ML ~~LOC~~ SOLN
25.0000 [IU] | Freq: Two times a day (BID) | SUBCUTANEOUS | Status: DC
Start: 2023-12-28 — End: 2023-12-29
  Administered 2023-12-28 – 2023-12-29 (×2): 25 [IU] via SUBCUTANEOUS
  Filled 2023-12-28 (×3): qty 0.25

## 2023-12-28 MED ORDER — LEVOTHYROXINE SODIUM 75 MCG PO TABS
150.0000 ug | ORAL_TABLET | Freq: Every day | ORAL | Status: DC
  Administered 2023-12-29: 150 ug via ORAL
  Filled 2023-12-28: qty 2

## 2023-12-28 MED ORDER — PREGABALIN 100 MG PO CAPS
300.0000 mg | ORAL_CAPSULE | Freq: Every day | ORAL | Status: DC
  Administered 2023-12-28 – 2024-01-01 (×5): 300 mg via ORAL
  Filled 2023-12-28 (×5): qty 3

## 2023-12-28 MED ORDER — ISOSORBIDE MONONITRATE ER 60 MG PO TB24
60.0000 mg | ORAL_TABLET | Freq: Every day | ORAL | Status: DC
  Administered 2023-12-28 – 2024-01-02 (×6): 60 mg via ORAL
  Filled 2023-12-28 (×3): qty 1
  Filled 2023-12-28: qty 2
  Filled 2023-12-28 (×2): qty 1

## 2023-12-28 MED ORDER — ATORVASTATIN CALCIUM 80 MG PO TABS
80.0000 mg | ORAL_TABLET | Freq: Every evening | ORAL | Status: DC
  Administered 2023-12-28 – 2024-01-02 (×6): 80 mg via ORAL
  Filled 2023-12-28 (×5): qty 1
  Filled 2023-12-28: qty 2

## 2023-12-28 MED ORDER — METOCLOPRAMIDE HCL 5 MG/ML IJ SOLN
20.0000 mg | Freq: Once | INTRAVENOUS | Status: AC
  Administered 2023-12-28: 20 mg via INTRAVENOUS
  Filled 2023-12-28: qty 4

## 2023-12-28 MED ORDER — IVABRADINE HCL 5 MG PO TABS
7.5000 mg | ORAL_TABLET | Freq: Two times a day (BID) | ORAL | Status: DC
  Administered 2023-12-29 – 2024-01-02 (×10): 7.5 mg via ORAL
  Filled 2023-12-28 (×12): qty 1

## 2023-12-28 MED ORDER — ACETAMINOPHEN 650 MG RE SUPP: 650.0000 mg | Freq: Four times a day (QID) | RECTAL | Status: DC | PRN

## 2023-12-28 MED ORDER — FUROSEMIDE 10 MG/ML IJ SOLN
120.0000 mg | Freq: Two times a day (BID) | INTRAVENOUS | Status: AC
  Administered 2023-12-28 – 2023-12-29 (×2): 120 mg via INTRAVENOUS
  Filled 2023-12-28: qty 12
  Filled 2023-12-28: qty 2
  Filled 2023-12-28: qty 10
  Filled 2023-12-28: qty 12

## 2023-12-28 MED ORDER — INSULIN ASPART 100 UNIT/ML IJ SOLN
0.0000 [IU] | Freq: Three times a day (TID) | INTRAMUSCULAR | Status: DC
  Administered 2023-12-29: 5 [IU] via SUBCUTANEOUS
  Administered 2023-12-29 (×2): 7 [IU] via SUBCUTANEOUS
  Administered 2023-12-30: 5 [IU] via SUBCUTANEOUS

## 2023-12-28 MED ORDER — ASPIRIN 81 MG PO TBEC
81.0000 mg | DELAYED_RELEASE_TABLET | Freq: Every day | ORAL | Status: DC
  Administered 2023-12-28 – 2024-01-02 (×6): 81 mg via ORAL
  Filled 2023-12-28 (×6): qty 1

## 2023-12-28 MED ORDER — PANTOPRAZOLE SODIUM 40 MG PO TBEC
40.0000 mg | DELAYED_RELEASE_TABLET | Freq: Two times a day (BID) | ORAL | Status: DC
  Administered 2023-12-29 – 2024-01-02 (×10): 40 mg via ORAL
  Filled 2023-12-28 (×10): qty 1

## 2023-12-28 MED ORDER — HEPARIN SODIUM (PORCINE) 5000 UNIT/ML IJ SOLN
5000.0000 [IU] | Freq: Three times a day (TID) | INTRAMUSCULAR | Status: DC
  Administered 2023-12-28 – 2024-01-02 (×14): 5000 [IU] via SUBCUTANEOUS
  Filled 2023-12-28 (×15): qty 1

## 2023-12-28 MED ORDER — SODIUM CHLORIDE 0.9% FLUSH
3.0000 mL | Freq: Two times a day (BID) | INTRAVENOUS | Status: DC
  Administered 2023-12-28 – 2024-01-02 (×10): 3 mL via INTRAVENOUS

## 2023-12-28 MED ORDER — PROCHLORPERAZINE EDISYLATE 10 MG/2ML IJ SOLN
5.0000 mg | Freq: Four times a day (QID) | INTRAMUSCULAR | Status: DC | PRN
  Administered 2023-12-30 – 2024-01-02 (×10): 5 mg via INTRAVENOUS
  Filled 2023-12-28 (×10): qty 2

## 2023-12-28 MED ORDER — INSULIN ASPART 100 UNIT/ML IJ SOLN
6.0000 [IU] | Freq: Three times a day (TID) | INTRAMUSCULAR | Status: DC
  Administered 2023-12-29 (×2): 6 [IU] via SUBCUTANEOUS

## 2023-12-28 MED ORDER — CALCITRIOL 0.25 MCG PO CAPS
0.2500 ug | ORAL_CAPSULE | Freq: Every day | ORAL | Status: DC
Start: 2023-12-29 — End: 2024-01-02
  Administered 2023-12-29 – 2024-01-02 (×5): 0.25 ug via ORAL
  Filled 2023-12-28 (×5): qty 1

## 2023-12-28 MED ORDER — DULOXETINE HCL 30 MG PO CPEP
60.0000 mg | ORAL_CAPSULE | Freq: Every day | ORAL | Status: DC
  Administered 2023-12-28 – 2024-01-01 (×5): 60 mg via ORAL
  Filled 2023-12-28 (×5): qty 2

## 2023-12-28 MED ORDER — ACETAMINOPHEN 325 MG PO TABS: 650.0000 mg | ORAL_TABLET | Freq: Four times a day (QID) | ORAL | Status: DC | PRN

## 2023-12-28 MED ORDER — ATORVASTATIN CALCIUM 40 MG PO TABS: 40.0000 mg | ORAL_TABLET | Freq: Every day | ORAL | Status: DC

## 2023-12-28 MED ORDER — HYDRALAZINE HCL 50 MG PO TABS
100.0000 mg | ORAL_TABLET | Freq: Three times a day (TID) | ORAL | Status: DC
  Administered 2023-12-28 – 2024-01-02 (×16): 100 mg via ORAL
  Filled 2023-12-28 (×6): qty 2
  Filled 2023-12-28: qty 4
  Filled 2023-12-28 (×9): qty 2

## 2023-12-28 MED ORDER — SENNA 8.6 MG PO TABS: 1.0000 | ORAL_TABLET | Freq: Every day | ORAL | Status: DC | PRN

## 2023-12-28 MED ORDER — FENOFIBRATE 54 MG PO TABS
54.0000 mg | ORAL_TABLET | Freq: Every day | ORAL | Status: DC
  Administered 2023-12-29 – 2024-01-02 (×5): 54 mg via ORAL
  Filled 2023-12-28 (×6): qty 1

## 2023-12-28 MED ORDER — INSULIN ASPART 100 UNIT/ML IJ SOLN
0.0000 [IU] | Freq: Every day | INTRAMUSCULAR | Status: DC
  Administered 2023-12-28 – 2023-12-29 (×2): 5 [IU] via SUBCUTANEOUS

## 2023-12-28 NOTE — H&P (Signed)
 History and Physical    Jennifer [name] FMW:[id number] DOB: [birth year] DOA: 12/28/2023  PCP: Emilio Joesph DEL, PA-C   Patient coming from: Home   Chief Complaint: Weight gain, abdominal distension, chest pain  HPI: Jennifer [name] is a 33 y.o. female with medical history significant for astrocytoma status post remote resection with residual left-sided weakness, hypertension, hypertriglyceridemia, insulin -dependent diabetes mellitus, chronic pancreatitis, CKD 3B, and nonischemic cardiomyopathy with EF 45 to 50% last month, now presenting from the cardiology clinic with weight gain, abdominal distention, chest pain, severe hypertension, and elevated heart rate.  Patient reports recent chest discomfort and worsening abdominal distention.  She denies shortness of breath, leg swelling, or cough.  Patient notes that she never develops leg swelling but that her abdomen becomes more distended and she develops chest pain like she is experiencing now when hypervolemic.  She was evaluated in the cardiology clinic today where she was noted to be severely hypertensive, in sinus tachycardia with rate 130s, and up 7 pounds.  She was sent to the ED for inpatient management.  ED Course: Upon arrival to the ED, patient is found to be afebrile and saturating well on room air with tachypnea, tachycardia, and elevated BP.  EKG demonstrates sinus tachycardia and chest x-ray is negative for acute findings.  Labs are most notable for BUN 67, creatinine 2.10, normal WBC, normal BNP, and troponin of 21 which decreased to 20.  Patient was evaluated by cardiology in the emergency department and started on high-dose IV Lasix .  Review of Systems:  All other systems reviewed and apart from HPI, are negative.  Past Medical History:  Diagnosis Date   Afib (HCC) 05/12/2021   Brain tumor (HCC) 03/29/1995   astrocytoma   CHF (congestive heart failure) (HCC)    Cholesterosis    CKD (chronic kidney disease) stage 4, GFR  15-29 ml/min (HCC) 05/13/2021   DM (diabetes mellitus) (HCC) 10/10/2018   Fatty liver    Gastroparesis due to DM (HCC) 05/12/2021   HTN (hypertension) 10/10/2018   Hypothyroidism 10/10/2018   Lipoprotein deficiency    Lung disease    longevity long term   Pancreatitis    Polycystic ovary syndrome     Past Surgical History:  Procedure Laterality Date   ABDOMINAL SURGERY     pt states during miscarriage got her intestine   BRAIN SURGERY     ESOPHAGOGASTRODUODENOSCOPY N/A 09/01/2023   Procedure: EGD (ESOPHAGOGASTRODUODENOSCOPY);  Surgeon: Abran Norleen SAILOR, MD;  Location: [facility];  Service: Gastroenterology;  Laterality: N/A;   EYE MUSCLE SURGERY Right 03/28/2014   PRESSURE SENSOR/CARDIOMEMS N/A 12/06/2021   Procedure: PRESSURE SENSOR/CARDIOMEMS;  Surgeon: [name] Toribio SAUNDERS, MD;  Location: [facility];  Service: Cardiovascular;  Laterality: N/A;   RIGHT HEART CATH N/A 08/05/2021   Procedure: RIGHT HEART CATH;  Surgeon: [name] Toribio SAUNDERS, MD;  Location: [facility];  Service: Cardiovascular;  Laterality: N/A;   RIGHT HEART CATH N/A 12/06/2021   Procedure: RIGHT HEART CATH;  Surgeon: [name] Toribio SAUNDERS, MD;  Location: [facility];  Service: Cardiovascular;  Laterality: N/A;   RIGHT HEART CATH N/A 12/13/2023   Procedure: RIGHT HEART CATH;  Surgeon: Zenaida Morene PARAS, MD;  Location: [facility];  Service: Cardiovascular;  Laterality: N/A;   RIGHT/LEFT HEART CATH AND CORONARY ANGIOGRAPHY N/A 12/03/2020   Procedure: RIGHT/LEFT HEART CATH AND CORONARY ANGIOGRAPHY;  Surgeon: Ladona Heinz, MD;  Location: [facility];  Service: Cardiovascular;  Laterality: N/A;   VENTRICULOSTOMY  03/28/1997    Social History:   reports that she has never smoked. She has never used smokeless tobacco. She reports that she does not drink alcohol  and does not use drugs.  Allergies  Allergen Reactions   Icosapent  Ethyl (Epa Ethyl Ester) (Fish) Hives   Ketamine Other (See  Comments)    In a vegetative state for 15 minutes, per pt   Maitake Itching and Other (See Comments)    Itchy throat   Morphine  Hives, Itching, Rash and Other (See Comments)    Sore throat, too   Penicillins Hives, Itching and Rash    Has patient had a PCN reaction causing immediate rash, facial/tongue/throat swelling, SOB or lightheadedness with hypotension: Y Has patient had a PCN reaction causing severe rash involving mucus membranes or skin necrosis: Y Has patient had a PCN reaction that required hospitalization: N Has patient had a PCN reaction occurring within the last 10 years: Y   Mushroom Other (See Comments)    Pt has never had mushrooms but tested positive on allergy test   Shellfish Allergy Other (See Comments)    Pt has never had shellfish, but tested positive on allergy test. Pt states contrast in CT is okay.   Fentanyl  Nausea And Vomiting   Prednisone Rash    Family History  Problem Relation Age of Onset   Diabetes Mother    Hypertension Mother    Hyperlipidemia Mother    Thyroid  disease Mother    Hypertension Father    Diabetes Father    Pancreatic cancer Paternal Aunt    Pancreatic cancer Paternal Uncle    Colon cancer Neg Hx    Esophageal cancer Neg Hx    Stomach cancer Neg Hx      Prior to Admission medications   Medication Sig Start Date End Date Taking? Authorizing Provider  aspirin  EC 81 MG tablet Take 1 tablet (81 mg total) by mouth daily. Swallow whole. Patient not taking: Reported on 12/28/2023 12/18/23   [name], Toribio SAUNDERS, MD  atorvastatin  (LIPITOR) 40 MG tablet Take 1 tablet (40 mg total) by mouth daily. 12/15/23 01/14/24  Arrien, Mauricio Daniel, MD  azelastine  (ASTELIN ) 0.1 % nasal spray Place 2 sprays into both nostrils daily as needed for rhinitis or allergies.    [provider]  BAQSIMI ONE PACK 3 MG/DOSE POWD Place 1 Dose into the nose once as needed (hypoglycemia). 07/31/23   [provider]  calcitRIOL  (ROCALTROL ) 0.25 MCG  capsule Take 0.25 mcg by mouth daily. 08/04/23   [provider]  Continuous Glucose Sensor (DEXCOM G7 SENSOR) MISC Inject 1 Device into the skin See admin instructions. Place 1 new sensor into the skin every 10 days 06/28/23   [provider]  dicyclomine  (BENTYL ) 20 MG tablet Take 1 tablet (20 mg total) by mouth 2 (two) times daily. 09/05/23   Dreama Longs, MD  DULoxetine  (CYMBALTA ) 60 MG capsule Take 60 mg by mouth at bedtime. 09/26/22   [provider]  ergocalciferol  (VITAMIN D2) 1.25 MG (50000 UT) capsule Take 1 capsule (50,000 Units total) by mouth every Sunday. 11/12/23   Lee, [place], NP  Glucose Blood (BLOOD GLUCOSE TEST STRIPS) STRP 1 each by Does not apply route 3 (three) times daily. Use as directed to check blood sugar. May dispense any manufacturer covered by patient's insurance and fits patient's device. 08/26/23   Shalhoub, Zachary PARAS, MD  HUMALOG  KWIKPEN 100 UNIT/ML KwikPen Inject 60-95 Units into the skin See admin instructions. Inject 60-95 units into  the skin three times a day before meals, per sliding scale    [provider]  HUMULIN  R U-500 KWIKPEN 500 UNIT/ML KwikPen Inject 195-300 Units into the skin See admin instructions. 195 am bef breakfast300 hs    [provider]  hydrALAZINE  (APRESOLINE ) 25 MG tablet Take 3 tablets (75 mg total) by mouth 3 (three) times daily. 12/18/23   Clegg, [name] D, NP  Insulin  Pen Needle (PEN NEEDLES) 31G X 5 MM MISC Use as directed 3 (three) times daily. 08/26/23   Shalhoub, Zachary PARAS, MD  isosorbide  mononitrate (IMDUR ) 30 MG 24 hr tablet Take 1 tablet (30 mg total) by mouth daily. 11/10/23 02/08/24  Lee, [place], NP  ivabradine  (CORLANOR ) 7.5 MG TABS tablet Take 1 tablet (7.5 mg total) by mouth 2 (two) times daily with a meal. 01/31/23   Milford, Harlene HERO, FNP  Lancet Device MISC 1 each by Does not apply route 3 (three) times daily. May dispense any manufacturer covered by patient's insurance. 08/26/23   Shalhoub,  Zachary PARAS, MD  Lancets MISC 1 each by Does not apply route 3 (three) times daily. Use as directed to check blood sugar. May dispense any manufacturer covered by patient's insurance and fits patient's device. 08/26/23   Shalhoub, Zachary PARAS, MD  lansoprazole  (PREVACID ) 30 MG capsule Take 1 capsule (30 mg total) by mouth 2 (two) times daily before a meal. 10/04/23   May, Deanna J, NP  levocetirizine (XYZAL ) 5 MG tablet Take 5 mg by mouth at bedtime. 03/05/22   [provider]  levothyroxine  (SYNTHROID ) 150 MCG tablet Take 150 mcg by mouth daily before breakfast.    [provider]  pregabalin  (LYRICA ) 300 MG capsule Take 300 mg by mouth at bedtime.    [provider]  prochlorperazine  (COMPAZINE ) 10 MG tablet Take 10 mg by mouth at bedtime. Preventative    [provider]  Safety Seal Miscellaneous MISC Hormonic Hair Solution with minoxidil USP 7% and finasteride USP 0.05% - apply to affected areas daily every morning. 06/06/23   Alm Delon SAILOR, DO  sodium bicarbonate  650 MG tablet Take 1,300 mg by mouth daily.    [provider]  torsemide  (DEMADEX ) 20 MG tablet Take 3 tablets (60 mg total) by mouth daily. 12/16/23   Zenaida Morene PARAS, MD  tretinoin  (RETIN-A ) 0.05 % cream Apply 1 Application topically at bedtime.    [provider]  TYLENOL  500 MG tablet Take 500-1,000 mg by mouth every 6 (six) hours as needed for mild pain (pain score 1-3) (or headaches).    [provider]    Physical Exam: Vitals:   12/28/23 1800 12/28/23 1818 12/28/23 1830 12/28/23 1934  BP: 137/82  115/63   Pulse: (!) 133  (!) 132 (!) 128  Resp: 15  (!) 26 (!) 24  Temp:  98.4 F (36.9 C)    TempSrc:  Oral    SpO2: 99%  99% 97%  Weight:      Height:        Constitutional: NAD, no pallor or diaphoresis   Eyes: PERTLA, lids and conjunctivae normal ENMT: Mucous membranes are moist. Posterior pharynx clear of any exudate or lesions.   Neck: supple, no masses   Respiratory: no wheezing, no crackles. No accessory muscle use.  Cardiovascular: Rate ~120 and regular. No extremity edema.   Abdomen: Soft, distended, no guarding. Bowel sounds active.  Musculoskeletal: no clubbing / cyanosis. No red/hot/swollen joints.   Skin: no significant rashes, lesions, ulcers. Warm,  dry, well-perfused. Neurologic: No aphasia or dysarthria. Moving all extremities. Alert and oriented.  Psychiatric: Calm. Cooperative.    Labs and Imaging on Admission: I have personally reviewed following labs and imaging studies  CBC: Recent Labs  Lab 12/28/23 1425 12/28/23 1609  WBC 10.4  --   HGB 11.4* 11.9*  HCT 34.3* 35.0*  MCV 88.6  --   PLT 281  --    Basic Metabolic Panel: Recent Labs  Lab 12/28/23 1609  NA 135  K 4.7  CL 104  GLUCOSE 152*  BUN 67*  CREATININE 2.10*   GFR: Estimated Creatinine Clearance: 29.6 mL/min (A) (by C-G formula based on SCr of 2.1 mg/dL (H)). Liver Function Tests: No results for input(s): AST, ALT, ALKPHOS, BILITOT, PROT, ALBUMIN  in the last 168 hours. No results for input(s): LIPASE, AMYLASE in the last 168 hours. No results for input(s): AMMONIA in the last 168 hours. Coagulation Profile: No results for input(s): INR, PROTIME in the last 168 hours. Cardiac Enzymes: No results for input(s): CKTOTAL, CKMB, CKMBINDEX, TROPONINI in the last 168 hours. BNP (last 3 results) Recent Labs    12/07/23 0018  PROBNP 433.0*   HbA1C: No results for input(s): HGBA1C in the last 72 hours. CBG: No results for input(s): GLUCAP in the last 168 hours. Lipid Profile: No results for input(s): CHOL, HDL, LDLCALC, TRIG, CHOLHDL, LDLDIRECT in the last 72 hours. Thyroid  Function Tests: No results for input(s): TSH, T4TOTAL, FREET4, T3FREE, THYROIDAB in the last 72 hours. Anemia Panel: No results for input(s): VITAMINB12, FOLATE, FERRITIN, TIBC, IRON, RETICCTPCT in the last 72  hours. Urine analysis:    Component Value Date/Time   COLORURINE STRAW (A) 12/07/2023 0018   APPEARANCEUR CLEAR 12/07/2023 0018   LABSPEC 1.008 12/07/2023 0018   PHURINE 5.5 12/07/2023 0018   GLUCOSEU TRACE (A) 12/07/2023 0018   HGBUR TRACE (A) 12/07/2023 0018   BILIRUBINUR NEGATIVE 12/07/2023 0018   KETONESUR NEGATIVE 12/07/2023 0018   PROTEINUR 1+ (A) 12/07/2023 0018   NITRITE NEGATIVE 12/07/2023 0018   LEUKOCYTESUR NEGATIVE 12/07/2023 0018   Sepsis Labs: @LABRCNTIP (procalcitonin:4,lacticidven:4) )No results found for this or any previous visit (from the past 240 hours).   Radiological Exams on Admission: DG Chest 2 View Result Date: 12/28/2023 CLINICAL DATA:  Chest pain, tachycardia and hypertension. EXAM: CHEST - 2 VIEW COMPARISON:  12/07/2023 FINDINGS: The heart size and mediastinal contours are within normal limits. Stable appearance of CardioMEMS device in a left posterior lower lobe pulmonary artery branch. Stable mild bibasilar atelectasis. There is no evidence of pulmonary edema, consolidation, pneumothorax, nodule or pleural fluid. The visualized skeletal structures are unremarkable. IMPRESSION: Stable mild bibasilar atelectasis. No acute findings. Electronically Signed   By: Marcey Moan M.D.   On: 12/28/2023 15:17    EKG: Independently reviewed. Sinus tachycardia, rate 135, LVH.   Assessment/Plan   1. Acute on chronic HFmrEF  - EF was 45-50% with grade 2 diastolic dysfunction on echo from 12/12/23    - Appreciate cardiology consultation, patient has been started on IV Lasix  and placed back on ivabradine , will monitor I&Os and daily weights, follow daily chem panel    2. CKD 3B  - Appears close to baseline  - Renally-dose medications, monitor closely while diuresing   3. Hypertensive urgency  - Anticipate improvement with diuresis  - Continue diuresis, hydralazine     4. Insulin -dependent DM  - A1c was 8.8% on July 2025  - Prescribed very high-doses insulin  but  compliance is in question, will start with  reduced dosing and adjust as needed   5. CAD  - Continue ASA, statin, Imdur     6. Hypertriglyceridemia  - Continue Lipitor, fenofibrate    7. OSA  - CPAP while sleeping    8. Hypothyroidism  - Synthroid    9. Chronic pain - Lyrica , Cymbalta     DVT prophylaxis: sq heparin   Code Status: Full  Level of Care: Level of care: Progressive Family Communication: None present   Disposition Plan:  Patient is from: Home  Anticipated d/c is to: TBD Anticipated d/c date is: 12/31/23  Patient currently: Pending improved volume status, stable renal function  Consults called: Cardiology  Admission status: Inpatient     Evalene GORMAN Sprinkles, MD [facility]  12/28/2023, 7:58 PM

## 2023-12-28 NOTE — ED Provider Notes (Signed)
 [place] EMERGENCY DEPARTMENT AT [facility] Provider Note   CSN: [id number] Arrival date & time: 12/28/23  1358     Patient presents with: Hypertension, Tachycardia, and Chest Pain   Jennifer [name] is a 33 y.o. female complains of tachycardia and left-sided chest pain that radiates to the left shoulder that began while she was at her cardiology appointment earlier today.  Referred here for evaluation of this by her cardiologist.  Extensive medical history, most pertinently history of type 2 diabetes, systolic heart failure, CKD, nonischemic cardiomyopathy.  Does not endorse any the weight gain over the last week.  Abdomen is edematous, however this is baseline for the patient as she states this is where her fluid usually accumulates.  She does not have any distal edema.    Per cardiology notes from today she had a sinus tachycardia at 135, elevated blood pressure 208/104, takes endralazine daily, 3 times daily for hypertension.  Also noted in the cardiology note that while patient endorses compliance with medications at home visits suggest otherwise.  There is also report of a drop in her urine output.  Per notes she has had 7 pounds of weight gain since being discharged from the hospital on 18 September.  Cardiology is anticipating readmission for CHF exacerbation.  Review of the echo report from 12 December 2023 shows a LVEF of 40 to 50%, mildly decreased left ventricular function with concentric left ventricular hypertrophy.  There is mild mitral valve regurgitation and trivial aortic valve regurgitation.  Inferior vena cava has greater than 50% respiratory variability.  Previously admitted on 22 August with vasculitis, triglycerides noted to be 4030 at that time.  Previous diagnoses include HFrEF, CAD, stage IV CKD, pulmonary hypertension, tricuspid regurgitation, hypertension, hypertriglyceridemia, diabetes, chronic abdominal swelling.  As previously noted, poor medication  compliance    Hypertension Associated symptoms include chest pain.  Chest Pain      Prior to Admission medications   Medication Sig Start Date End Date Taking? Authorizing Provider  aspirin  EC 81 MG tablet Take 1 tablet (81 mg total) by mouth daily. Swallow whole. Patient not taking: Reported on 12/28/2023 12/18/23   [name], Toribio SAUNDERS, MD  atorvastatin  (LIPITOR) 40 MG tablet Take 1 tablet (40 mg total) by mouth daily. 12/15/23 01/14/24  Arrien, Mauricio Daniel, MD  azelastine  (ASTELIN ) 0.1 % nasal spray Place 2 sprays into both nostrils daily as needed for rhinitis or allergies.    [provider]  BAQSIMI ONE PACK 3 MG/DOSE POWD Place 1 Dose into the nose once as needed (hypoglycemia). 07/31/23   [provider]  calcitRIOL  (ROCALTROL ) 0.25 MCG capsule Take 0.25 mcg by mouth daily. 08/04/23   [provider]  Continuous Glucose Sensor (DEXCOM G7 SENSOR) MISC Inject 1 Device into the skin See admin instructions. Place 1 new sensor into the skin every 10 days 06/28/23   [provider]  dicyclomine  (BENTYL ) 20 MG tablet Take 1 tablet (20 mg total) by mouth 2 (two) times daily. 09/05/23   Dreama Longs, MD  DULoxetine  (CYMBALTA ) 60 MG capsule Take 60 mg by mouth at bedtime. 09/26/22   [provider]  ergocalciferol  (VITAMIN D2) 1.25 MG (50000 UT) capsule Take 1 capsule (50,000 Units total) by mouth every Sunday. 11/12/23   Lee, [place], NP  Glucose Blood (BLOOD GLUCOSE TEST STRIPS) STRP 1 each by Does not apply route 3 (three) times daily. Use as directed to check blood sugar. May dispense any manufacturer covered by patient's insurance and fits  patient's device. 08/26/23   Shalhoub, Zachary PARAS, MD  HUMALOG  KWIKPEN 100 UNIT/ML KwikPen Inject 60-95 Units into the skin See admin instructions. Inject 60-95 units into the skin three times a day before meals, per sliding scale    [provider]  HUMULIN  R U-500 KWIKPEN 500 UNIT/ML KwikPen Inject 195-300  Units into the skin See admin instructions. 195 am bef breakfast300 hs    [provider]  hydrALAZINE  (APRESOLINE ) 25 MG tablet Take 3 tablets (75 mg total) by mouth 3 (three) times daily. 12/18/23   Clegg, [name] D, NP  Insulin  Pen Needle (PEN NEEDLES) 31G X 5 MM MISC Use as directed 3 (three) times daily. 08/26/23   Shalhoub, Zachary PARAS, MD  isosorbide  mononitrate (IMDUR ) 30 MG 24 hr tablet Take 1 tablet (30 mg total) by mouth daily. 11/10/23 02/08/24  Lee, [place], NP  ivabradine  (CORLANOR ) 7.5 MG TABS tablet Take 1 tablet (7.5 mg total) by mouth 2 (two) times daily with a meal. 01/31/23   Milford, Harlene HERO, FNP  Lancet Device MISC 1 each by Does not apply route 3 (three) times daily. May dispense any manufacturer covered by patient's insurance. 08/26/23   Shalhoub, Zachary PARAS, MD  Lancets MISC 1 each by Does not apply route 3 (three) times daily. Use as directed to check blood sugar. May dispense any manufacturer covered by patient's insurance and fits patient's device. 08/26/23   Shalhoub, Zachary PARAS, MD  lansoprazole  (PREVACID ) 30 MG capsule Take 1 capsule (30 mg total) by mouth 2 (two) times daily before a meal. 10/04/23   May, Deanna J, NP  levocetirizine (XYZAL ) 5 MG tablet Take 5 mg by mouth at bedtime. 03/05/22   [provider]  levothyroxine  (SYNTHROID ) 150 MCG tablet Take 150 mcg by mouth daily before breakfast.    [provider]  pregabalin  (LYRICA ) 300 MG capsule Take 300 mg by mouth at bedtime.    [provider]  prochlorperazine  (COMPAZINE ) 10 MG tablet Take 10 mg by mouth at bedtime. Preventative    [provider]  Safety Seal Miscellaneous MISC Hormonic Hair Solution with minoxidil USP 7% and finasteride USP 0.05% - apply to affected areas daily every morning. 06/06/23   Alm Delon SAILOR, DO  sodium bicarbonate  650 MG tablet Take 1,300 mg by mouth daily.    [provider]  torsemide  (DEMADEX ) 20 MG tablet Take 3 tablets (60 mg total) by mouth  daily. 12/16/23   Zenaida Morene PARAS, MD  tretinoin  (RETIN-A ) 0.05 % cream Apply 1 Application topically at bedtime.    [provider]  TYLENOL  500 MG tablet Take 500-1,000 mg by mouth every 6 (six) hours as needed for mild pain (pain score 1-3) (or headaches).    [provider]    Allergies: Icosapent  ethyl (epa ethyl ester) (fish), Ketamine, [place], Morphine , Penicillins, Mushroom, Shellfish allergy, Fentanyl , and Prednisone    Review of Systems  Cardiovascular:  Positive for chest pain.  All other systems reviewed and are negative.   Updated Vital Signs BP 115/63   Pulse (!) 132   Temp 98.4 F (36.9 C) (Oral)   Resp (!) 26   Ht 4' 9 (1.448 m)   Wt 64 kg   SpO2 99%   BMI 30.51 kg/m   Physical Exam Vitals and nursing note reviewed.  Constitutional:      General: [name] not in acute distress.    Appearance: Normal appearance.  HENT:     Head: Normocephalic and atraumatic.  Mouth/Throat:     Mouth: Mucous membranes are moist.     Pharynx: Oropharynx is clear.  Eyes:     General: Lids are normal. Vision grossly intact. Gaze aligned appropriately.     Extraocular Movements: Extraocular movements intact.     Conjunctiva/sclera: Conjunctivae normal.     Pupils: Pupils are equal, round, and reactive to light.     Comments: Patient baseline for patient, she is blind in right eye.  Cardiovascular:     Rate and Rhythm: Regular rhythm. Tachycardia present.     Pulses: Normal pulses.          Radial pulses are 2+ on the right side and 2+ on the left side.     Heart sounds: S1 normal and S2 normal. Murmur heard.     Systolic murmur is present with a grade of 2/6.     No friction rub. No gallop.     Comments: Grade 2 systolic murmur appreciated in the left lower sternal border. Pulmonary:     Effort: Pulmonary effort is normal.     Breath sounds: Normal breath sounds.  Abdominal:     General: Abdomen is flat. Bowel sounds are normal.     Palpations:  Abdomen is soft.  Musculoskeletal:        General: Normal range of motion.     Cervical back: Normal range of motion and neck supple.     Right lower leg: No edema.     Left lower leg: No edema.  Skin:    General: Skin is warm and dry.     Capillary Refill: Capillary refill takes less than 2 seconds.  Neurological:     General: No focal deficit present.     Mental Status: [name] alert. Mental status is at baseline.  Psychiatric:        Mood and Affect: Mood normal.     (all labs ordered are listed, but only abnormal results are displayed) Labs Reviewed  CBC - Abnormal; Notable for the following components:      Result Value   Hemoglobin 11.4 (*)    HCT 34.3 (*)    RDW 16.2 (*)    nRBC 0.4 (*)    All other components within normal limits  I-STAT CHEM 8, ED - Abnormal; Notable for the following components:   BUN 67 (*)    Creatinine, Ser 2.10 (*)    Glucose, Bld 152 (*)    Hemoglobin 11.9 (*)    HCT 35.0 (*)    All other components within normal limits  TROPONIN I (HIGH SENSITIVITY) - Abnormal; Notable for the following components:   Troponin I (High Sensitivity) 21 (*)    All other components within normal limits  TROPONIN I (HIGH SENSITIVITY) - Abnormal; Notable for the following components:   Troponin I (High Sensitivity) 20 (*)    All other components within normal limits  HCG, SERUM, QUALITATIVE  BRAIN NATRIURETIC PEPTIDE  BASIC METABOLIC PANEL WITH GFR    EKG: None  Radiology: DG Chest 2 View Result Date: 12/28/2023 CLINICAL DATA:  Chest pain, tachycardia and hypertension. EXAM: CHEST - 2 VIEW COMPARISON:  12/07/2023 FINDINGS: The heart size and mediastinal contours are within normal limits. Stable appearance of CardioMEMS device in a left posterior lower lobe pulmonary artery branch. Stable mild bibasilar atelectasis. There is no evidence of pulmonary edema, consolidation, pneumothorax, nodule or pleural fluid. The visualized skeletal structures are unremarkable.  IMPRESSION: Stable mild bibasilar atelectasis. No acute findings. Electronically Signed  By: Marcey Moan M.D.   On: 12/28/2023 15:17     Procedures   Medications Ordered in the ED  hydrALAZINE  (APRESOLINE ) tablet 100 mg (100 mg Oral Given 12/28/23 1616)  isosorbide  mononitrate (IMDUR ) 24 hr tablet 60 mg (60 mg Oral Given 12/28/23 1615)  furosemide  (LASIX ) 120 mg in dextrose  5 % 50 mL IVPB (0 mg Intravenous Stopped 12/28/23 1739)  aspirin  EC tablet 81 mg (has no administration in time range)  ivabradine  (CORLANOR ) tablet 7.5 mg (7.5 mg Oral Not Given 12/28/23 1850)  atorvastatin  (LIPITOR) tablet 80 mg (has no administration in time range)  fenofibrate  tablet 54 mg (has no administration in time range)  metoCLOPramide  (REGLAN ) 20 mg in dextrose  5 % 50 mL IVPB (0 mg Intravenous Stopped 12/28/23 1850)                                    Medical Decision Making Amount and/or Complexity of Data Reviewed Labs: ordered. Radiology: ordered.  Risk Decision regarding hospitalization.   Medical Decision Making:   Jennifer [name] is a 33 y.o. female who presented to the ED today with left-sided chest pain and tachycardia detailed above.    Additional history discussed with patient's family/caregivers.  External chart has been reviewed including cardiology records, previous admissions, previous labs and imaging. Patient's presentation is complicated by their history of HFrEF, CKD.  Patient placed on continuous vitals and telemetry monitoring while in ED which was reviewed periodically.  Complete initial physical exam performed, notably the patient  was alert and oriented in no apparent distress.  Physical exam is largely unremarkable, except as noted in the exam findings..    Reviewed and confirmed nursing documentation for past medical history, family history, social history.    Initial Assessment:   With the patient's presentation of chest pain and tachycardia, most likely diagnosis is  sequelae of chronic HFrEF.  Acute ACS/STEMI, fluid overload secondary to CKD, acute hepatobiliary disease.  Given her blood pressure and history of noncompliance, consider sequelae of uncontrolled hypertension.  Initial Plan:   Screening labs including CBC and Metabolic panel to evaluate for infectious or metabolic etiology of disease.  CXR to evaluate for structural/infectious intrathoracic pathology.  EKG/Troponin testing/BNP testing to evaluate for cardiac pathology. Objective evaluation as below reviewed   Initial Study Results:   Laboratory  All laboratory results reviewed without evidence of clinically relevant pathology.   Exceptions include: Creatinine is 2.10, this is improved from previous.  Hemoglobin is 11.9, troponin elevated to 20 however this is baseline for patient.  EKG EKG was reviewed independently. Rate, rhythm, axis, intervals all examined and without medically relevant abnormality. ST segments without concerns for elevations.    Radiology:  All images reviewed independently. Agree with radiology report at this time.   DG Chest 2 View Result Date: 12/28/2023 CLINICAL DATA:  Chest pain, tachycardia and hypertension. EXAM: CHEST - 2 VIEW COMPARISON:  12/07/2023 FINDINGS: The heart size and mediastinal contours are within normal limits. Stable appearance of CardioMEMS device in a left posterior lower lobe pulmonary artery branch. Stable mild bibasilar atelectasis. There is no evidence of pulmonary edema, consolidation, pneumothorax, nodule or pleural fluid. The visualized skeletal structures are unremarkable. IMPRESSION: Stable mild bibasilar atelectasis. No acute findings. Electronically Signed   By: Marcey Moan M.D.   On: 12/28/2023 15:17   CARDIAC CATHETERIZATION Result Date: 12/13/2023 HEMODYNAMICS: RA:       6 mmHg (  mean) RV:       46/, 6 mmHg PA:       46/23 mmHg (31 mean) PCWP: 13 mmHg (mean)    Estimated Fick CO/CI   2.8L/min, 1.8/min/m2    TPG  18  mmHg     PVR   6.4 Wood Units PAPi  3.8  IMPRESSION: Right heart catheterization for evaluation of volume status and calibration of cardiomems Normal left and right heart filling pressures Significant respiratory variation, but cardiomems correlates well with RHC numbers Reduced cardiac output by assumed Fick, though PA saturation similar to previous cath in 2023 Elevated SVR of 2857dynes/sec*cm-5 RECOMMENDATIONS: Increase afterload reduction, hold diuretics today, start back orals tomorrow When evaluating cardiomems, would look at the PA tracings and use the end expiratory values when looking at [facility]   ECHOCARDIOGRAM COMPLETE Result Date: 12/12/2023    ECHOCARDIOGRAM REPORT   Patient Name:   Jennifer [name] Date of Exam: 12/12/2023 Medical Rec #:  [id number]         Height:       57.0 in Accession #:    [id number]        Weight:       142.2 lb Date of Birth:  26-May-1990         BSA:          1.556 m Patient Age:    33 years          BP:           118/73 mmHg Patient Gender: F                 HR:           69 bpm. Exam Location:  Inpatient Procedure: 2D Echo, Color Doppler and Cardiac Doppler (Both Spectral and Color            Flow Doppler were utilized during procedure). Indications:    I50.9* Heart failure (unspecified)  History:        Patient has prior history of Echocardiogram examinations, most                 recent 08/16/2023. CHF; Risk Factors:Hypertension and Diabetes.  Sonographer:    Damien Senior RDCS Referring Phys: [id number] [name] D CLEGG  Sonographer Comments: Ordered without definity . IMPRESSIONS  1. Left ventricular ejection fraction, by estimation, is 45 to 50%. The left ventricle has mildly decreased function. The left ventricle has no regional wall motion abnormalities. There is mild concentric left ventricular hypertrophy. Left ventricular diastolic parameters are consistent with Grade II diastolic dysfunction (pseudonormalization).  2. Right ventricular systolic function is low normal. The right ventricular size  is normal. There is normal pulmonary artery systolic pressure. The estimated right ventricular systolic pressure is 31.7 mmHg.  3. The mitral valve is normal in structure. Mild mitral valve regurgitation.  4. The aortic valve is normal in structure. Aortic valve regurgitation is trivial.  5. The inferior vena cava is normal in size with greater than 50% respiratory variability, suggesting right atrial pressure of 3 mmHg. FINDINGS  Left Ventricle: Left ventricular ejection fraction, by estimation, is 45 to 50%. The left ventricle has mildly decreased function. The left ventricle has no regional wall motion abnormalities. The left ventricular internal cavity size was normal in size. There is mild concentric left ventricular hypertrophy. Left ventricular diastolic function could not be evaluated due to nondiagnostic images. Left ventricular diastolic parameters are consistent with Grade II diastolic dysfunction (pseudonormalization). Right Ventricle: The right ventricular size is  normal. No increase in right ventricular wall thickness. Right ventricular systolic function is low normal. There is normal pulmonary artery systolic pressure. The tricuspid regurgitant velocity is 2.68 m/s,  and with an assumed right atrial pressure of 3 mmHg, the estimated right ventricular systolic pressure is 31.7 mmHg. Left Atrium: Left atrial size was normal in size. Right Atrium: Right atrial size was normal in size. Pericardium: There is no evidence of pericardial effusion. Mitral Valve: The mitral valve is normal in structure. Mild mitral valve regurgitation. Tricuspid Valve: The tricuspid valve is normal in structure. Tricuspid valve regurgitation is mild. Aortic Valve: The aortic valve is normal in structure. Aortic valve regurgitation is trivial. Pulmonic Valve: The pulmonic valve was grossly normal. Pulmonic valve regurgitation is mild. Aorta: The aortic root and ascending aorta are structurally normal, with no evidence of  dilitation. Venous: The inferior vena cava is normal in size with greater than 50% respiratory variability, suggesting right atrial pressure of 3 mmHg. IAS/Shunts: No atrial level shunt detected by color flow Doppler.  LEFT VENTRICLE PLAX 2D LVIDd:         4.50 cm   Diastology LVIDs:         3.60 cm   LV e' medial:    4.79 cm/s LV PW:         1.10 cm   LV E/e' medial:  17.2 LV IVS:        1.00 cm   LV e' lateral:   7.29 cm/s LVOT diam:     1.80 cm   LV E/e' lateral: 11.3 LV SV:         37 LV SV Index:   24 LVOT Area:     2.54 cm  RIGHT VENTRICLE RV S prime:     9.79 cm/s TAPSE (M-mode): 1.9 cm LEFT ATRIUM             Index        RIGHT ATRIUM           Index LA diam:        3.00 cm 1.93 cm/m   RA Area:     11.60 cm LA Vol (A2C):   35.6 ml 22.88 ml/m  RA Volume:   24.20 ml  15.55 ml/m LA Vol (A4C):   31.1 ml 19.99 ml/m LA Biplane Vol: 34.3 ml 22.04 ml/m  AORTIC VALVE LVOT Vmax:   75.00 cm/s LVOT Vmean:  57.800 cm/s LVOT VTI:    0.144 m  AORTA Ao Root diam: 2.40 cm Ao Asc diam:  2.80 cm MITRAL VALVE               TRICUSPID VALVE MV Area (PHT): 3.60 cm    TR Peak grad:   28.7 mmHg MV Decel Time: 211 msec    TR Vmax:        268.00 cm/s MV E velocity: 82.30 cm/s MV A velocity: 28.30 cm/s  SHUNTS MV E/A ratio:  2.91        Systemic VTI:  0.14 m                            Systemic Diam: 1.80 cm Aditya Sabharwal Electronically signed by Ria Commander Signature Date/Time: 12/12/2023/5:25:41 PM    Final    US  Abdomen Limited RUQ (LIVER/GB) Result Date: 12/10/2023 CLINICAL DATA:  33 year old female undergoing evaluation for liver function, ascites. EXAM: ULTRASOUND ABDOMEN LIMITED RIGHT UPPER QUADRANT COMPARISON:  CT Abdomen and Pelvis  09/05/2023. FINDINGS: Gallbladder: No gallstones or wall thickening visualized. No sonographic Murphy sign noted by sonographer. Common bile duct: Diameter: 5 mm, normal. Liver: Echogenic liver (image 12). No discrete liver lesion by ultrasound. No intrahepatic biliary ductal  dilatation. Portal vein is patent on color Doppler imaging with normal direction of blood flow towards the liver. Other: Negative visible right kidney. No ascites identified on imaging of all 4 quadrants. IMPRESSION: 1. Evidence of Fatty Liver Disease. No discrete liver lesion. No ascites. 2. Negative gallbladder and bile ducts. Electronically Signed   By: VEAR Hurst M.D.   On: 12/10/2023 07:44   DG Chest 2 View Result Date: 12/07/2023 EXAM: 2 VIEW(S) XRAY OF THE CHEST 12/07/2023 02:53:39 AM COMPARISON: None available. CLINICAL HISTORY: 141880 SOB (shortness of breath) [id number]. Table formatting from the original note was not included. Reports several days of abdominal distention and suspected fluid overload. Says she has gained 5lbs this week and stomach pain has become unbearable. FINDINGS: LUNGS AND PLEURA: No focal pulmonary opacity. No pulmonary edema. No pleural effusion. No pneumothorax. HEART AND MEDIASTINUM: Stable cardiomegaly. CardioMEMS device. BONES AND SOFT TISSUES: No acute osseous abnormality. IMPRESSION: 1. No acute process. Electronically signed by: Norman Gatlin MD 12/07/2023 02:56 AM EDT RP Workstation: [id number]      Consults: Case discussed with Dr. [name], cardiology who is caring for this patient.  Further consulted with hospitalist for admission secondary to fluid overload and nonresponse to IV furosemide .  Consulted with Dr. Charlton with hospitalist team for inpatient admission  Reassessment and Plan:   Since patient has not responded to IV diuresis, patient was consulted for inpatient admission with cardiology following secondary to fluid overload as well as her extensive previous medical history.  Consulted with hospitalist team who is excepted patient for admission with cardiology following.       Final diagnoses:  Tachycardia  Other hypervolemia    ED Discharge Orders     None          Myriam Dorn BROCKS, [place] 12/28/23 1915    Ula Prentice SAUNDERS, MD 12/29/23  [phone]

## 2023-12-28 NOTE — ED Triage Notes (Signed)
 Patient was brought to the ED from the heart clinic with complaints of chest pain. Tachycardia, and hypertension. Patient also states she has had increased fluid. Denies shortness of breath, chest pain is located on left side and radiates to her shoulder.   PMH: CHF

## 2023-12-28 NOTE — Plan of Care (Signed)
  Problem: Coping: Goal: Ability to adjust to condition or change in health will improve Outcome: Progressing   Problem: Fluid Volume: Goal: Ability to maintain a balanced intake and output will improve Outcome: Progressing   Problem: Metabolic: Goal: Ability to maintain appropriate glucose levels will improve Outcome: Progressing   Problem: Nutritional: Goal: Maintenance of adequate nutrition will improve Outcome: Progressing   Problem: Skin Integrity: Goal: Risk for impaired skin integrity will decrease Outcome: Progressing   Problem: Tissue Perfusion: Goal: Adequacy of tissue perfusion will improve Outcome: Progressing   Problem: Clinical Measurements: Goal: Ability to maintain clinical measurements within normal limits will improve Outcome: Progressing Goal: Will remain free from infection Outcome: Progressing Goal: Diagnostic test results will improve Outcome: Progressing Goal: Respiratory complications will improve Outcome: Progressing Goal: Cardiovascular complication will be avoided Outcome: Progressing   Problem: Activity: Goal: Risk for activity intolerance will decrease Outcome: Progressing   Problem: Nutrition: Goal: Adequate nutrition will be maintained Outcome: Progressing   Problem: Coping: Goal: Level of anxiety will decrease Outcome: Progressing   Problem: Elimination: Goal: Will not experience complications related to bowel motility Outcome: Progressing Goal: Will not experience complications related to urinary retention Outcome: Progressing   Problem: Pain Managment: Goal: General experience of comfort will improve and/or be controlled Outcome: Progressing   Problem: Safety: Goal: Ability to remain free from injury will improve Outcome: Progressing   Problem: Skin Integrity: Goal: Risk for impaired skin integrity will decrease Outcome: Progressing

## 2023-12-28 NOTE — Consult Note (Addendum)
 Advanced Heart Failure Team Consult Note   Primary Physician: [name] HF Cardiologist: Dr. [name]  Reason for Consultation: Acute on chronic HFmrEF  HPI:    Jennifer [name] is seen today for evaluation of acute on chronic HFmrEF at the request of Dr. Flora Dec, PA-C with EM. 33 y.o. 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   Admitted 8/22 with vasculitis (no pancreatitis). Her TG were found to be 4030. Echo 11/28/20 EF 20-25% with moderate RV dysfunction and mild septal flattening. Started on DBA 2.5. cMRI c/w prior myocarditis vs sarcoid. LVEF 29%. RV normal. R/LHC showed single vessel RCA occlusion, RA 1, LVEDP 17, Fick CO 5.6/CI 3.88.   Echo 10/22: EF up to 45-50%, grade II DD, RV ok. LifeVest off.   Admitted 5/23 with AKI and hyperkalemia. Given IVF, insulin , calcium  gluconate and Lokelma . Echo showed EF 45-50%, RV normal, mild-mod MR, RVSP 30. Only mild TR. Restarted on torsemide  40 bid (lower dose) and Entresto  49/51 bid. Arranged paramedicine, discharged home 143 lbs.    Admitted 9/23 for volume overload in setting of RV failure and AKI. Required milrinone . Underwent Cardiomems implant. RHC showed RA 15, PA 37/23 (28), PCWP 16, Fick CO/CI 4.2/2.6, Thermo CO/CI 3.7/2.3, PVR 4.3, PA sat 51%, 54%, PAPi 0.93   Previously being followed by Dr. Kimble at [facility]. Had renal biopsy 10/23 which showed DM and HTN nephropathy. They discussed starting HD soon. Now being followed by [facility].    Echo 10/04/22 EF 55% RV ok. Mild AI    Continues to have multiple hospitalizations and ED visits for hyperglycemia, hyperTG (>5,000), uncontrolled HTN, and abdominal pain. It is suspected she has OSA. Observed apnea noted previous hospitalizations. She has been referred to Pulmonology and is now on  CPAP.   Recently admitted 9/25 for a/c CHF in setting of poor med compliance. Echo EF 45-50%, RV mildly reduced. She was diuresed w/ IV Lasix  gtt and metolazone , sCr bumped to >3.0. RHC showed RA 6, PA 46/23 (mean 31), PCWP 13, FICK CI 1.8, TPG 18, PVR 6.4 WU, PAPi 3.8, severely elevated SVR 2857. Significant respiratory variation, but cardiomems correlates well with RHC numbers. She was re-enrolled in paramedicine program after discharge.  Presented to clinic today for follow-up. She notes worsening dyspnea, mild chest pain and abdominal distension. She's had similar chest pain in the past with volume overload. Weight up 7 lb. BP markedly elevated 208/104 and sinus tach 130s. ReDS 41% and cardiomems not reliable d/t tachycardia. Paramedicine noted she has not been compliant with home regimen. She was referred to the ED for further evaluation.    Home Medications Prior to Admission medications   Medication Sig Start Date End Date Taking? Authorizing Provider  aspirin  EC 81 MG tablet Take 1 tablet (81 mg total) by mouth daily. Swallow whole. Patient not taking: Reported on 12/28/2023 12/18/23   Zannie Locastro, Jennifer SAUNDERS, MD  atorvastatin  (LIPITOR) 40 MG tablet Take 1 tablet (40 mg total) by mouth daily. 12/15/23 01/14/24  Arrien, Mauricio Jadarius Commons, MD  azelastine  (ASTELIN ) 0.1 % nasal spray Place 2 sprays into both nostrils daily as needed for rhinitis or allergies.    [provider]  BAQSIMI ONE PACK 3 MG/DOSE POWD Place 1 Dose into the nose once as needed (hypoglycemia). 07/31/23   [provider]  calcitRIOL  (ROCALTROL ) 0.25 MCG capsule  Take 0.25 mcg by mouth daily. 08/04/23   [provider]  Continuous Glucose Sensor (DEXCOM G7 SENSOR) MISC Inject 1 Device into the skin See admin instructions. Place 1 new sensor into the skin every 10 days 06/28/23   [provider]  dicyclomine  (BENTYL ) 20 MG tablet Take 1 tablet (20 mg total) by mouth 2 (two) times daily. 09/05/23    Dreama Longs, MD  DULoxetine  (CYMBALTA ) 60 MG capsule Take 60 mg by mouth at bedtime. 09/26/22   [provider]  ergocalciferol  (VITAMIN D2) 1.25 MG (50000 UT) capsule Take 1 capsule (50,000 Units total) by mouth every Sunday. 11/12/23   Lee, [place], NP  Glucose Blood (BLOOD GLUCOSE TEST STRIPS) STRP 1 each by Does not apply route 3 (three) times daily. Use as directed to check blood sugar. May dispense any manufacturer covered by patient's insurance and fits patient's device. 08/26/23   Shalhoub, Zachary PARAS, MD  HUMALOG  KWIKPEN 100 UNIT/ML KwikPen Inject 60-95 Units into the skin See admin instructions. Inject 60-95 units into the skin three times a day before meals, per sliding scale    [provider]  HUMULIN  R U-500 KWIKPEN 500 UNIT/ML KwikPen Inject 195-300 Units into the skin See admin instructions. 195 am bef breakfast300 hs    [provider]  hydrALAZINE  (APRESOLINE ) 25 MG tablet Take 3 tablets (75 mg total) by mouth 3 (three) times daily. 12/18/23   Clegg, [name] D, NP  Insulin  Pen Needle (PEN NEEDLES) 31G X 5 MM MISC Use as directed 3 (three) times daily. 08/26/23   Shalhoub, Zachary PARAS, MD  isosorbide  mononitrate (IMDUR ) 30 MG 24 hr tablet Take 1 tablet (30 mg total) by mouth daily. 11/10/23 02/08/24  Lee, [place], NP  ivabradine  (CORLANOR ) 7.5 MG TABS tablet Take 1 tablet (7.5 mg total) by mouth 2 (two) times daily with a meal. 01/31/23   Milford, Harlene HERO, FNP  Lancet Device MISC 1 each by Does not apply route 3 (three) times daily. May dispense any manufacturer covered by patient's insurance. 08/26/23   Shalhoub, Zachary PARAS, MD  Lancets MISC 1 each by Does not apply route 3 (three) times daily. Use as directed to check blood sugar. May dispense any manufacturer covered by patient's insurance and fits patient's device. 08/26/23   Shalhoub, Zachary PARAS, MD  lansoprazole  (PREVACID ) 30 MG capsule Take 1 capsule (30 mg total) by mouth 2 (two) times daily before a meal. 10/04/23   May,  Deanna J, NP  levocetirizine (XYZAL ) 5 MG tablet Take 5 mg by mouth at bedtime. 03/05/22   [provider]  levothyroxine  (SYNTHROID ) 150 MCG tablet Take 150 mcg by mouth daily before breakfast.    [provider]  pregabalin  (LYRICA ) 300 MG capsule Take 300 mg by mouth at bedtime.    [provider]  prochlorperazine  (COMPAZINE ) 10 MG tablet Take 10 mg by mouth at bedtime. Preventative    [provider]  Safety Seal Miscellaneous MISC Hormonic Hair Solution with minoxidil USP 7% and finasteride USP 0.05% - apply to affected areas daily every morning. 06/06/23   Alm Delon SAILOR, DO  sodium bicarbonate  650 MG tablet Take 1,300 mg by mouth daily.    [provider]  torsemide  (DEMADEX ) 20 MG tablet Take 3 tablets (60 mg total) by mouth daily. 12/16/23   Zenaida Morene PARAS, MD  tretinoin  (RETIN-A ) 0.05 % cream Apply 1 Application topically at bedtime.    [provider]  TYLENOL  500 MG tablet Take 500-1,000 mg  by mouth every 6 (six) hours as needed for mild pain (pain score 1-3) (or headaches).    [provider]    Past Medical History: Past Medical History:  Diagnosis Date   Afib (HCC) 05/12/2021   Brain tumor (HCC) 03/29/1995   astrocytoma   CHF (congestive heart failure) (HCC)    Cholesterosis    CKD (chronic kidney disease) stage 4, GFR 15-29 ml/min (HCC) 05/13/2021   DM (diabetes mellitus) (HCC) 10/10/2018   Fatty liver    Gastroparesis due to DM (HCC) 05/12/2021   HTN (hypertension) 10/10/2018   Hypothyroidism 10/10/2018   Lipoprotein deficiency    Lung disease    longevity long term   Pancreatitis    Polycystic ovary syndrome     Past Surgical History: Past Surgical History:  Procedure Laterality Date   ABDOMINAL SURGERY     pt states during miscarriage got her intestine   BRAIN SURGERY     ESOPHAGOGASTRODUODENOSCOPY N/A 09/01/2023   Procedure: EGD (ESOPHAGOGASTRODUODENOSCOPY);  Surgeon: Abran Norleen SAILOR, MD;   Location: [facility];  Service: Gastroenterology;  Laterality: N/A;   EYE MUSCLE SURGERY Right 03/28/2014   PRESSURE SENSOR/CARDIOMEMS N/A 12/06/2021   Procedure: PRESSURE SENSOR/CARDIOMEMS;  Surgeon: [name] Jennifer SAUNDERS, MD;  Location: [facility];  Service: Cardiovascular;  Laterality: N/A;   RIGHT HEART CATH N/A 08/05/2021   Procedure: RIGHT HEART CATH;  Surgeon: [name] Jennifer SAUNDERS, MD;  Location: [facility];  Service: Cardiovascular;  Laterality: N/A;   RIGHT HEART CATH N/A 12/06/2021   Procedure: RIGHT HEART CATH;  Surgeon: [name] Jennifer SAUNDERS, MD;  Location: [facility];  Service: Cardiovascular;  Laterality: N/A;   RIGHT HEART CATH N/A 12/13/2023   Procedure: RIGHT HEART CATH;  Surgeon: Zenaida Morene PARAS, MD;  Location: [facility] INVASIVE CV LAB;  Service: Cardiovascular;  Laterality: N/A;   RIGHT/LEFT HEART CATH AND CORONARY ANGIOGRAPHY N/A 12/03/2020   Procedure: RIGHT/LEFT HEART CATH AND CORONARY ANGIOGRAPHY;  Surgeon: Ladona Heinz, MD;  Location: [facility];  Service: Cardiovascular;  Laterality: N/A;   VENTRICULOSTOMY  03/28/1997    Family History: Family History  Problem Relation Age of Onset   Diabetes Mother    Hypertension Mother    Hyperlipidemia Mother    Thyroid  disease Mother    Hypertension Father    Diabetes Father    Pancreatic cancer Paternal Aunt    Pancreatic cancer Paternal Uncle    Colon cancer Neg Hx    Esophageal cancer Neg Hx    Stomach cancer Neg Hx     Social History: Social History   Socioeconomic History   Marital status: Divorced    Spouse name: Not on file   Number of children: 0   Years of education: Not on file   Highest education level: Not on file  Occupational History   Occupation: Dance movement psychotherapist   Occupation: N/A  Tobacco Use   Smoking status: Never   Smokeless tobacco: Never  Vaping Use   Vaping status: Never Used  Substance and Sexual Activity   Alcohol  use: Never   Drug use: Never   Sexual activity: Yes   Other Topics Concern   Not on file  Social History Narrative   Not on file   Social Drivers of Health   Financial Resource Strain: Low Risk  (10/24/2023)   Received from [facility]   Overall Financial Resource Strain (CARDIA)    How hard is it for you to pay for the very basics like  food, housing, medical care, and heating?: Not very hard  Food Insecurity: No Food Insecurity (12/07/2023)   Hunger Vital Sign    Worried About Running Out of Food in the Last Year: Never true    Ran Out of Food in the Last Year: Never true  Transportation Needs: No Transportation Needs (12/07/2023)   PRAPARE - Administrator, Civil Service (Medical): No    Lack of Transportation (Non-Medical): No  Physical Activity: Unknown (10/24/2023)   Received from [facility]   Exercise Vital Sign    On average, how many days per week do you engage in moderate to strenuous exercise (like a brisk walk)?: Patient declined    Minutes of Exercise per Session: Not on file  Stress: Patient Declined (10/24/2023)   Received from [facility] - Occupational Stress Questionnaire    Do you feel stress - tense, restless, nervous, or anxious, or unable to sleep at night because your mind is troubled all the time - these days?: Patient declined  Social Connections: Patient Declined (10/24/2023)   Received from [facility]   Social Network    How would you rate your social network (family, work, friends)?: Patient declined    Allergies:  Allergies  Allergen Reactions   Icosapent  Ethyl (Epa Ethyl Ester) (Fish) Hives   Ketamine Other (See Comments)    In a vegetative state for 15 minutes, per pt   Maitake Itching and Other (See Comments)    Itchy throat   Morphine  Hives, Itching, Rash and Other (See Comments)    Sore throat, too   Penicillins Hives, Itching and Rash    Has patient had a PCN reaction causing immediate rash, facial/tongue/throat swelling, SOB or  lightheadedness with hypotension: Y Has patient had a PCN reaction causing severe rash involving mucus membranes or skin necrosis: Y Has patient had a PCN reaction that required hospitalization: N Has patient had a PCN reaction occurring within the last 10 years: Y   Mushroom Other (See Comments)    Pt has never had mushrooms but tested positive on allergy test   Shellfish Allergy Other (See Comments)    Pt has never had shellfish, but tested positive on allergy test. Pt states contrast in CT is okay.   Fentanyl  Nausea And Vomiting   Prednisone Rash    Objective:    Vital Signs:   Temp:  [98.8 F (37.1 C)] 98.8 F (37.1 C) (10/02 1405) Pulse Rate:  [126-129] 126 (10/02 1500) Resp:  [16-18] 16 (10/02 1500) BP: (148-177)/(98-100) 148/98 (10/02 1500) SpO2:  [99 %] 99 % (10/02 1405) Weight:  [64 kg] 64 kg (10/02 1500)    Weight change: Filed Weights   12/28/23 1500  Weight: 64 kg    Intake/Output:  No intake or output data in the 24 hours ending 12/28/23 1517    Physical Exam    General: Short stature. Appears older than stated age. Cor: JVP to jaw. Regular rate & rhythm, tachy. No murmurs. Lungs: clear Abdomen: distended Extremities: no edema Neuro: alert & orientedx3. Affect pleasant   Telemetry   Sinus tach 120s  Labs   Basic Metabolic Panel: No results for input(s): NA, K, CL, CO2, GLUCOSE, BUN, CREATININE, CALCIUM , MG, PHOS in the last 168 hours.  Liver Function Tests: No results for input(s): AST, ALT, ALKPHOS, BILITOT, PROT, ALBUMIN  in the last 168 hours. No results for input(s): LIPASE, AMYLASE in the last 168 hours. No results for input(s):  AMMONIA in the last 168 hours.  CBC: No results for input(s): WBC, NEUTROABS, HGB, HCT, MCV, PLT in the last 168 hours.  Cardiac Enzymes: No results for input(s): CKTOTAL, CKMB, CKMBINDEX, TROPONINI in the last 168 hours.  BNP: BNP (last 3  results) Recent Labs    08/20/23 0551 11/10/23 1238 11/10/23 1627  BNP 278.7* 27.7 20.9    ProBNP (last 3 results) Recent Labs    12/07/23 0018  PROBNP 433.0*     CBG: No results for input(s): GLUCAP in the last 168 hours.  Coagulation Studies: No results for input(s): LABPROT, INR in the last 72 hours.   Imaging   No results found.   Medications:     Current Medications:   Infusions:     Patient Profile   33 y.o. female with history of HFrEF/NICM, CAD, CKD IV, type 3c DM, severe hypertrigyceridemia 2/2 LPL deficiency, OSA, uncontrolled HTN.   Admitted with acute on chronic HFmrEF and uncontrolled HTN in setting of nonadherence.  Assessment/Plan   HFrEF now with HFmrEF/nonischemic cardiomyopathy - prev reduced EF to 29%. Now stable EF 45-55% since 2022. With fluctuant RV function. - most recent echo 9/25: EF 45-50%, RV mildly reduced - RHC 9/25 RA 6, PA 46/23 (mean 31), PCWP 13, FICK CI 1.8, TPG 18, PVR 6.4 WU, PAPi 3.8, severely elevated SVR 2857 - Hydralazine  titrated post RHC for further afterload reduction but pt has been noncompliant w/ meds/diuretics since d/c, per paramedicine report. Also noncompliant w/ sending daily cardioMEMs transmissions.  - Back today w/ NYHA Class III symptoms, marked volume overload. CardioMEMSs not accurate with tachycardia. ReDS 41%.  -Start IV lasix  120 BID (required this dose last admission). If unable to diurese, may need Nephrology consult. May be approaching need for iHD. - Increase home hydralazine  to 100 mg TID for afterload reduction, increase home imdur  to 60 mg daily - restart home corlanor  7.5 mg TID - GDMT limited by CKD   2. CAD - Single vessel RCA occlusion (small co-dominant vessel) on cardiac cath 09/08. - she reports mild chest pain in setting of marked hypertension and volume overload. HS troponin just mildly elevated at 21.  - Continue medical management. Restart home aspirin  and statin  Previously on repatha  but insurance stopped covering. Has upcoming appointment with lipid clinic.   3.  CKD IV - Previously followed by Dr. Kimble (Nephrology at [facility]), now followed by [facility] (Dr. Louella) - Had renal biopsy (10/23) and showed DM and HTN nephropathy. - Scr baseline variable with frequent AKIs, typically 2.0-2.5 - AKI last month with Scr up to 3.7 - BMET hemolyzed dt severe hypertriglyceridemia, will check iSTAT.   4. Pulmonary hypertension - RHC RHC 9/25 RA 6, PA 46/23 (mean 31), PCWP 13, FICK CI 1.8, TPG 18, PVR 6.4 WU, PAPi 3.8, severely elevated SVR 2857 - Echo 9/25, RV mildly reduced  - V/Q negative - Off sildenafil  d/t dizziness and low BP   5. Uncontrolled HTN - In setting of noncompliance with home meds (see paramedicine encounters).   - Add back meds as above  7. OSA - per [facility] - has CPAP but compliance poor    8. Severe hyperTG - Due to LPL deficiency w/ hx pancreatitis. - Follows in Lipid Clinic (Dr. Mona)  - fenofibrate  discontinued sometime during last admission. Rationale not clear. TG > 4,000 last admission. Will restart renally dosed fenofibrate  at 54 mg daily and increase home atorvastatin  to 80 (held off on rosuvastatin  d/t kidney function)  9. Type 3c DM - 2/2 chronic pancreatitis  - Follows with [facility] - On insulin    10. Chronic abdominal swelling/bloating - Seen at [facility] (11/20) for IBS, per chart review. - Had penumatosis intestinalis. She was started on pancreatic enzyme replacement. - Has DM gastroparesis - Suspect primarily fatty liver   11. Poor Med Compliance/SDOH - recently re-enrolled in Paramedicine program but c/w poor compliance     Length of Stay: 0  FINCH, LINDSAY N, PA-C  12/28/2023, 3:17 PM  Advanced Heart Failure Team Pager [phone] (M-F; 7a - 5p)  Please contact [facility] for night-coverage after hours (4p -7a ) and weekends on [email or web address]   Patient seen and examined with the above-signed  Advanced Practice Provider and/or Housestaff. I personally reviewed laboratory data, imaging studies and relevant notes. I independently examined the patient and formulated the important aspects of the plan. I have edited the note to reflect any of my changes or salient points. I have personally discussed the plan with the patient and/or family.  33 y/o female with HFmrEF, RV failure, CKD IV, severe DM.  Now presents with progressive volume overload. Recent RHC with relatively well compensated numbers.   General:  Sitting up in bed No resp difficulty HEENT: normal Neck: supple. JVP to ear Carotids 2+ bilat; no bruits. No lymphadenopathy or thryomegaly appreciated. Cor: PMI nondisplaced. Tachy regular  Lungs: clear Abdomen: soft, nontender, ++ distended. No hepatosplenomegaly. No bruits or masses. Good bowel sounds. Extremities: no cyanosis, clubbing, rash, edema Neuro: alert & orientedx3, cranial nerves grossly intact. moves all 4 extremities w/o difficulty. Affect pleasant  She has recurrent volume overload. Suspect due in part to non-compliance. Will treat with high-dose lasix  if no response may be nearing time to consider HD (most recent renal indice BUN/Cr 130/3.1)   Restart ivabradine . Treat BP.   Jennifer Fuel, MD  4:53 PM

## 2023-12-28 NOTE — Progress Notes (Signed)
 Paramedicine Encounter   Patient ID: Jennifer [name] , female,   DOB: [birth year],33 y.o.,  MRN: [id number]   Met patient in clinic today with provider.  Weight @ clinic-146 B/P-208/104 P-135 [id number]   Arrived for pt at her clinic appoint and her v/s were just obtained and due to those being extremely elevated they are sending her to ER.   Not been using her cardiomems.  Med changes->>ER  Izetta Quivers, EMT-Paramedic [phone] 12/28/2023

## 2023-12-28 NOTE — Progress Notes (Signed)
 ReDS Vest / Clip - 12/28/23 1340       ReDS Vest / Clip   Station Marker B    Ruler Value 28.5    ReDS Value Range High volume overload    ReDS Actual Value 41

## 2023-12-28 NOTE — ED Notes (Signed)
 Pt transported to [name]. Will be brought to room 5 when done

## 2023-12-28 NOTE — ED Notes (Signed)
 Transport coming to take patient to room.

## 2023-12-28 NOTE — ED Provider Triage Note (Signed)
 Emergency Medicine Provider Triage Evaluation Note  Mayli Covington , a 33 y.o. female  was evaluated in triage.  Pt complains of tachycardia and left-sided chest pain that radiates to the left shoulder that began while she was at her cardiology appointment earlier today.  Referred here for evaluation of this by her cardiologist.  Extensive medical history, most pertinently history of type 2 diabetes, systolic heart failure, CKD, nonischemic cardiomyopathy.  Does not endorse any the weight gain over the last week.  Abdomen is edematous, however this is baseline for the patient as she states this is where her fluid usually accumulates.  She does not have any distal edema.  Review of Systems  Positive: As above Negative:   Physical Exam  BP (!) 177/100 (BP Location: Right Arm)   Pulse (!) 129   Temp 98.8 F (37.1 C) (Oral)   Resp 18   SpO2 99%  Gen:   Awake, no distress   Resp:  Normal effort  MSK:   Moves extremities without difficulty  Other:    Medical Decision Making  Medically screening exam initiated at 2:32 PM.  Appropriate orders placed.  Ramonita Koenig was informed that the remainder of the evaluation will be completed by another provider, this initial triage assessment does not replace that evaluation, and the importance of remaining in the ED until their evaluation is complete.  Initial order set placed.   Myriam Dorn BROCKS, [place] 12/28/23 1434

## 2023-12-28 NOTE — Progress Notes (Signed)
 Advanced Heart Failure Clinic Note   PCP: Emilio Joesph DEL, PA-C Primary Cardiologist: [name], MD HF Cardiologist: Dr. [name]    Reason for visit: f/u for chronic systolic heart failure   HPI: Jennifer [name] is a 33 y.o. 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   Has been followed by Dr. Bergamo for her HF.    Admitted 8/22 with vasculitis (no pancreatitis). Her TG were found to be 4030. Echo 11/28/20 EF 20-25% with moderate RV dysfunction and mild septal flattening. Started on DBA 2.5. cMRI c/w prior myocarditis vs sarcoid (see below). Mod pericardial effusion also noted. LVEF 29%. RV normal. She underwent R/LHC showing single vessel RCA occlusion, RA 1, LVEDP 17, Fick CO 5.6/CI 3.88. She was able to be weaned off inotropes and GDMT titrated.     Post hospital follow up she was hypervolemic, ReDs 43%. She followed up with Dr. Fredrica with [facility] 9/22 for a 2nd opinion. He placed LifeVest and switched beta blocker to Toprol  XL.   Echo 10/22: EF up to 45-50%, grade II DD, RV ok. LifeVest off.   Treated in ED 10/22 for pancreatitis, then admitted 11/22 for hyperglycemia.   Echo 12/22 EF 45%, RV normal.    Follow up 5/23, weight up 16 lbs, ReDs 27%. Arranged for RHC to better assess hemodynamics and see if abdominal symptoms related to right-sided HF due to high-output state or non-cardiac. This showed elevated filling pressures and normal cardiac ouput. Mild to moderate mixed PH with prominent v-waves in RA, suggestive of significant TR. Given IV lasix  and metolazone  2.5 added weekly to diuretic regimen.    Admitted 5/23 with AKI and hyperkalemia. Given IVF, insulin , calcium  gluconate and Lokelma . Echo showed EF 45-50%, RV normal, mild-mod MR, RVSP 30. Only mild TR. Restarted on torsemide  40 bid  (lower dose) and Entresto  49/51 bid. Arranged paramedicine, discharged home 143 lbs.   Seen 7/23 at [facility] for 2nd opinion, felt to be congested and recommended increasing torsemide  to 60 mg bid and instructed f/u in 3 months.   Received Furoscix  8/4, 8/5, and 10/31/21.  Admitted 9/23 for volume overload in setting of RV failure and AKI. Required milrinone . Underwent Cardiomems implant. RHC showed RA 15, PA 37/23 (28), PCWP 16, Fick CO/CI 4.2/2.6, Thermo CO/CI 3.7/2.3, PVR 4.3, PA sat 51%, 54%, PAPi 0.93  Previously being followed by Dr. Kimble at [facility]. Had renal biopsy 10/23 which showed DM and HTN nephropathy. They discussed starting HD soon. Now being followed by [facility].   Echo 10/04/22 EF 55% RV ok. Mild AI   Continues to have multiple hospitalizations and ED visits for hyperglycemia, hyperTG (>5,000), uncontrolled HTN, and abdominal pain. It is suspected she has OSA. Observed apnea noted previous hospitalizations. She has been referred to Pulmonology and is now on CPAP.  Recently admitted 9/25 for a/c CHF in setting of poor med compliance. Echo EF 45-50%, RV mildly reduced. She was diuresed w/ IV Lasix  gtt and metolazone , sCr bumped to >3.0. Underwent RHC which showed RA 6, PA 46/23 (mean 31), PCWP 13, FICK CI 1.8, TPG 18, PVR 6.4 WU, PAPi 3.8, severely elevated SVR 2857. Significant respiratory variation, but cardiomems correlates well with RHC numbers. Of note it was recommended, when evaluating cardiomems, would look at the PA tracings and use the end expiratory values when looking at [facility].   He diuretics  were held x 2 days and hydralazine  was increased for afterload reduction. She was instructed to restart home torsemide  2 days post d/c. She was also agreeable to re-enroll in the paramedicine program and has been set up. D/c wt 139 lb.   She presents today for f/u. [name] being followed by paramedicine [place]). She has not been compliant w/ sending regular CardioMEMs transmissions. She did  send a transmission this morning (first in 2 wks) but of limited value as her HR was markedly elevated at 145 bpm when transmission was sent (PA tracing unreliable, reviewed w/ Dr. Zenaida).   In clinic today she reports feeling poorly. Abdomen distended. HR remains elevated, EKG sinus tach 135 bpm. BP markedly elevated at 208/104. ReDs 41%. She denies resting dyspnea but NYHA Class III. Reports mild sharp chest pain. She told me she has been fully compliant w/ meds but [name] w/ Paramedicine says she has not been compliant at all. Pt also reports significant drop in UOP. She thinks her kidney's are shutting down. She has increased her fluid intake significantly. Wt up 7 lb since discharge.   Given presentation and critical vital signs, rapid response called and pt sent to the ED for further management and likely readmission.    ROS: All systems reviewed and negative except as per HPI.   Past Medical History:  Diagnosis Date   Afib (HCC) 05/12/2021   Brain tumor (HCC) 03/29/1995   astrocytoma   CHF (congestive heart failure) (HCC)    Cholesterosis    CKD (chronic kidney disease) stage 4, GFR 15-29 ml/min (HCC) 05/13/2021   DM (diabetes mellitus) (HCC) 10/10/2018   Fatty liver    Gastroparesis due to DM (HCC) 05/12/2021   HTN (hypertension) 10/10/2018   Hypothyroidism 10/10/2018   Lipoprotein deficiency    Lung disease    longevity long term   Pancreatitis    Polycystic ovary syndrome    Current Outpatient Medications  Medication Sig Dispense Refill   atorvastatin  (LIPITOR) 40 MG tablet Take 1 tablet (40 mg total) by mouth daily. 30 tablet 0   azelastine  (ASTELIN ) 0.1 % nasal spray Place 2 sprays into both nostrils daily as needed for rhinitis or allergies.     BAQSIMI ONE PACK 3 MG/DOSE POWD Place 1 Dose into the nose once as needed (hypoglycemia).     calcitRIOL  (ROCALTROL ) 0.25 MCG capsule Take 0.25 mcg by mouth daily.     Continuous Glucose Sensor (DEXCOM G7 SENSOR) MISC Inject 1  Device into the skin See admin instructions. Place 1 new sensor into the skin every 10 days     dicyclomine  (BENTYL ) 20 MG tablet Take 1 tablet (20 mg total) by mouth 2 (two) times daily. 20 tablet 0   DULoxetine  (CYMBALTA ) 60 MG capsule Take 60 mg by mouth at bedtime.     ergocalciferol  (VITAMIN D2) 1.25 MG (50000 UT) capsule Take 1 capsule (50,000 Units total) by mouth every Sunday. 4 capsule 0   Glucose Blood (BLOOD GLUCOSE TEST STRIPS) STRP 1 each by Does not apply route 3 (three) times daily. Use as directed to check blood sugar. May dispense any manufacturer covered by patient's insurance and fits patient's device. 100 strip 0   HUMALOG  KWIKPEN 100 UNIT/ML KwikPen Inject 60-95 Units into the skin See admin instructions. Inject 60-95 units into the skin three times a day before meals, per sliding scale     HUMULIN  R U-500 KWIKPEN 500 UNIT/ML KwikPen Inject 195-300 Units into the skin See admin instructions. 195  am bef breakfast300 hs     hydrALAZINE  (APRESOLINE ) 25 MG tablet Take 3 tablets (75 mg total) by mouth 3 (three) times daily. 270 tablet 6   Insulin  Pen Needle (PEN NEEDLES) 31G X 5 MM MISC Use as directed 3 (three) times daily. 100 each 0   isosorbide  mononitrate (IMDUR ) 30 MG 24 hr tablet Take 1 tablet (30 mg total) by mouth daily. 90 tablet 3   ivabradine  (CORLANOR ) 7.5 MG TABS tablet Take 1 tablet (7.5 mg total) by mouth 2 (two) times daily with a meal. 60 tablet 6   Lancet Device MISC 1 each by Does not apply route 3 (three) times daily. May dispense any manufacturer covered by patient's insurance. 1 each 0   Lancets MISC 1 each by Does not apply route 3 (three) times daily. Use as directed to check blood sugar. May dispense any manufacturer covered by patient's insurance and fits patient's device. 100 each 0   lansoprazole  (PREVACID ) 30 MG capsule Take 1 capsule (30 mg total) by mouth 2 (two) times daily before a meal. 60 capsule 2   levocetirizine (XYZAL ) 5 MG tablet Take 5 mg by  mouth at bedtime.     levothyroxine  (SYNTHROID ) 150 MCG tablet Take 150 mcg by mouth daily before breakfast.     pregabalin  (LYRICA ) 300 MG capsule Take 300 mg by mouth at bedtime.     prochlorperazine  (COMPAZINE ) 10 MG tablet Take 10 mg by mouth at bedtime. Preventative     Safety Seal Miscellaneous MISC Hormonic Hair Solution with minoxidil USP 7% and finasteride USP 0.05% - apply to affected areas daily every morning. 30 mL 2   sodium bicarbonate  650 MG tablet Take 1,300 mg by mouth daily.     torsemide  (DEMADEX ) 20 MG tablet Take 3 tablets (60 mg total) by mouth daily. 90 tablet 2   tretinoin  (RETIN-A ) 0.05 % cream Apply 1 Application topically at bedtime.     aspirin  EC 81 MG tablet Take 1 tablet (81 mg total) by mouth daily. Swallow whole. (Patient not taking: Reported on 12/28/2023) 30 tablet 1   TYLENOL  500 MG tablet Take 500-1,000 mg by mouth every 6 (six) hours as needed for mild pain (pain score 1-3) (or headaches).     No current facility-administered medications for this encounter.   Allergies  Allergen Reactions   Icosapent  Ethyl (Epa Ethyl Ester) (Fish) Hives   Ketamine Other (See Comments)    In a vegetative state for 15 minutes, per pt   Maitake Itching and Other (See Comments)    Itchy throat   Morphine  Hives, Itching, Rash and Other (See Comments)    Sore throat, too   Penicillins Hives, Itching and Rash    Has patient had a PCN reaction causing immediate rash, facial/tongue/throat swelling, SOB or lightheadedness with hypotension: Y Has patient had a PCN reaction causing severe rash involving mucus membranes or skin necrosis: Y Has patient had a PCN reaction that required hospitalization: N Has patient had a PCN reaction occurring within the last 10 years: Y   Mushroom Other (See Comments)    Pt has never had mushrooms but tested positive on allergy test   Shellfish Allergy Other (See Comments)    Pt has never had shellfish, but tested positive on allergy test. Pt  states contrast in CT is okay.   Fentanyl  Nausea And Vomiting   Prednisone Rash   Social History   Socioeconomic History   Marital status: Divorced    Spouse name: Not  on file   Number of children: 0   Years of education: Not on file   Highest education level: Not on file  Occupational History   Occupation: Dance movement psychotherapist   Occupation: N/A  Tobacco Use   Smoking status: Never   Smokeless tobacco: Never  Vaping Use   Vaping status: Never Used  Substance and Sexual Activity   Alcohol  use: Never   Drug use: Never   Sexual activity: Yes  Other Topics Concern   Not on file  Social History Narrative   Not on file   Social Drivers of Health   Financial Resource Strain: Low Risk  (10/24/2023)   Received from [facility]   Overall Financial Resource Strain (CARDIA)    How hard is it for you to pay for the very basics like food, housing, medical care, and heating?: Not very hard  Food Insecurity: No Food Insecurity (12/07/2023)   Hunger Vital Sign    Worried About Running Out of Food in the Last Year: Never true    Ran Out of Food in the Last Year: Never true  Transportation Needs: No Transportation Needs (12/07/2023)   PRAPARE - Administrator, Civil Service (Medical): No    Lack of Transportation (Non-Medical): No  Physical Activity: Unknown (10/24/2023)   Received from [facility]   Exercise Vital Sign    On average, how many days per week do you engage in moderate to strenuous exercise (like a brisk walk)?: Patient declined    Minutes of Exercise per Session: Not on file  Stress: Patient Declined (10/24/2023)   Received from [facility] - Occupational Stress Questionnaire    Do you feel stress - tense, restless, nervous, or anxious, or unable to sleep at night because your mind is troubled all the time - these days?: Patient declined  Social Connections: Patient Declined (10/24/2023)   Received from [facility]    Social Network    How would you rate your social network (family, work, friends)?: Patient declined  Intimate Partner Violence: Not At Risk (12/07/2023)   Humiliation, Afraid, Rape, and Kick questionnaire    Fear of Current or Ex-Partner: No    Emotionally Abused: No    Physically Abused: No    Sexually Abused: No   Family History  Problem Relation Age of Onset   Diabetes Mother    Hypertension Mother    Hyperlipidemia Mother    Thyroid  disease Mother    Hypertension Father    Diabetes Father    Pancreatic cancer Paternal Aunt    Pancreatic cancer Paternal Uncle    Colon cancer Neg Hx    Esophageal cancer Neg Hx    Stomach cancer Neg Hx    BP (!) 208/104   Pulse (!) 135   Wt 66.2 kg (146 lb)   SpO2 97%   BMI 31.59 kg/m   Wt Readings from Last 3 Encounters:  12/28/23 66.2 kg (146 lb)  12/20/23 63.5 kg (140 lb)  12/18/23 63.5 kg (140 lb)   Vitals:   12/28/23 1340  BP: (!) 208/104  Pulse: (!) 135  SpO2: 97%   Physical Exam  GENERAL: chronically ill appearing female, NAD Lungs- decreased BS at the bases bilaterally  CARDIAC:  JVP: 12 cm          Reg rhythm, tachy rate. 2/6 TR murmur.  No edema  ABDOMEN: obese, distended, mild diffuse tenderness  EXTREMITIES: Warm and well perfused.  NEUROLOGIC: moves all 4 ext w/o difficulty. Affect flat    Cardiomems (personally reviewed): PAD 33, (goal 34), PA tracing however unreliable in the setting of severe tachycardia (HR 145 bpm)  ReDs reading: 41%, abnormal   ASSESSMENT & PLAN: Nonischemic cardiomyopathy, recovered EF, previously with HFrEF (EF 29%) - prev reduced EF to 29%. Now stable EF 45-55% since 2022. With fluctuant RV function. - most recent echo 9/25: EF 45-50%, RV mildly reduced - Recent RHC 9/25 RA 6, PA 46/23 (mean 31), PCWP 13, FICK CI 1.8, TPG 18, PVR 6.4 WU, PAPi 3.8, severely elevated SVR 2857 - Hydralazine  titrated post RHC for further afterload reduction but pt has been noncompliant w/ meds/diuretics  since d/c, per paramedicine report. Also noncompliant w/ sending daily cardioMEMs transmissions.  - Back today w/ NYHA Class III symptoms, marked volume overload, marked hypertension BP 208/104 and tachycardic w/ sustained HR in the 130s-140s, sinus tach. HR was 145 bpm during today's CardioMEMSs transmission this morning and 135 bpm on EKG. Volume overloaded w/ marked abdominal distention and elevated ReDs at 41%. Given presentation and critical vital signs, rapid response called and pt sent to the ED for further management and likely readmission. Given her other non cardiac medical problems that will need to be addressed, will need to be admitted under internal medicine service. Will need diuresis, BP control and restart of oral GDMT as renal fx permits.    2. CAD - Single vessel RCA occlusion (small co-dominant vessel) on cardiac cath 09/08. - she reports mild chest pain in setting of marked hypertension/tachycardia. Rapid response notified. Sending to ED for further w/u   - Continue medical management   3.  CKD IV - Scr baseline variable with frequent AKIs, typically 2.0-2.5 - Previously followed by Dr. Kimble (Nephrology at [facility]) - Had renal biopsy (10/23) and showed DM and HTN nephropathy.  - Now followed by [facility] - Had recent AKI recent admit w/ SCr bumping to 3.7, down to 3.0 by discharge - she reports significant drop in UOP. Check and follow BMP closely    4. Pulmonary hypertension - Mild to moderate on RHC 5/23. PVR 3.7 WU. - RHC 9/23: RA 15, PA 37/23 (28), PCWP 16,  Thermo CO/CI 3.7/2.3, PVR 4.3, PA sat 51%, 54%, PAPi 0.93  - RHC RHC 9/25 RA 6, PA 46/23 (mean 31), PCWP 13, FICK CI 1.8, TPG 18, PVR 6.4 WU, PAPi 3.8, severely elevated SVR 2857 - Echo 9/25, RV mildly reduced  - V/Q negative - Off sildenafil  d/t dizziness and low BP   5. TR - Prominent v-waves in RA tracing on RHC 5/23. - prev stable mild/mod - echo 9/23 trival  6. Uncontrolled HTN - BP 208/104, in setting of med  noncompliance - sending to ED for BP control. Will need restart of oral meds as renal fx permits    7. OSA - per [facility] - has CPAP but compliance poor   8. Severe hyperTG - Due to LPL deficiency w/ recent pancreatitis. - Follows in [facility] (Dr. Mona)  - Continue fenofibrate    9. Type 3c DM - 2/2 chronic pancreatitis  - Follows with [facility] - On insulin   10. Chronic abdominal swelling/bloating - Seen at [facility] (11/20) for IBS, per chart review. - Had penumatosis intestinalis. She was started on pancreatic enzyme replacement. - Has DM gastroparesis - Suspect primarily fatty liver  11. Poor Med Compliance/SDOH - recently re-enrolled in Paramedicine program but c/w poor compliance   Sending pt to  ED for further management.   Caffie Shed, PA-C  1:55 PM

## 2023-12-29 DIAGNOSIS — I5023 Acute on chronic systolic (congestive) heart failure: Secondary | ICD-10-CM | POA: Diagnosis not present

## 2023-12-29 DIAGNOSIS — Z85841 Personal history of malignant neoplasm of brain: Secondary | ICD-10-CM

## 2023-12-29 DIAGNOSIS — K861 Other chronic pancreatitis: Secondary | ICD-10-CM

## 2023-12-29 DIAGNOSIS — N1832 Chronic kidney disease, stage 3b: Secondary | ICD-10-CM | POA: Diagnosis not present

## 2023-12-29 DIAGNOSIS — I10 Essential (primary) hypertension: Secondary | ICD-10-CM | POA: Diagnosis not present

## 2023-12-29 DIAGNOSIS — I5033 Acute on chronic diastolic (congestive) heart failure: Secondary | ICD-10-CM

## 2023-12-29 DIAGNOSIS — I2510 Atherosclerotic heart disease of native coronary artery without angina pectoris: Secondary | ICD-10-CM | POA: Diagnosis not present

## 2023-12-29 LAB — BASIC METABOLIC PANEL WITH GFR
Anion gap: 13 (ref 5–15)
BUN: 50 mg/dL — ABNORMAL HIGH (ref 6–20)
CO2: 21 mmol/L — ABNORMAL LOW (ref 22–32)
Calcium: 8.8 mg/dL — ABNORMAL LOW (ref 8.9–10.3)
Chloride: 94 mmol/L — ABNORMAL LOW (ref 98–111)
Creatinine, Ser: 1.66 mg/dL — ABNORMAL HIGH (ref 0.44–1.00)
GFR, Estimated: 42 mL/min — ABNORMAL LOW (ref >60)
Glucose, Bld: 322 mg/dL — ABNORMAL HIGH (ref 70–99)
Potassium: 5 mmol/L (ref 3.5–5.1)
Sodium: 128 mmol/L — ABNORMAL LOW (ref 135–145)

## 2023-12-29 LAB — CBC
HCT: 31.2 % — ABNORMAL LOW (ref 36.0–46.0)
Hemoglobin: 11.8 g/dL — ABNORMAL LOW (ref 12.0–15.0)
MCH: 29.9 pg (ref 26.0–34.0)
MCHC: 33.5 g/dL (ref 30.0–36.0)
MCV: 89.3 fL (ref 80.0–100.0)
Platelets: 183 K/uL (ref 150–400)
RBC: 3.57 MIL/uL — ABNORMAL LOW (ref 3.87–5.11)
RDW: 15.4 % (ref 11.5–15.5)
WBC: 8.8 K/uL (ref 4.0–10.5)
nRBC: 0 % (ref 0.0–0.2)

## 2023-12-29 LAB — GLUCOSE, CAPILLARY
Glucose-Capillary: 255 mg/dL — ABNORMAL HIGH (ref 70–99)
Glucose-Capillary: 343 mg/dL — ABNORMAL HIGH (ref 70–99)
Glucose-Capillary: 331 mg/dL — ABNORMAL HIGH (ref 70–99)
Glucose-Capillary: 383 mg/dL — ABNORMAL HIGH (ref 70–99)
Glucose-Capillary: 422 mg/dL — ABNORMAL HIGH (ref 70–99)

## 2023-12-29 LAB — MAGNESIUM: Magnesium: 2.3 mg/dL (ref 1.7–2.4)

## 2023-12-29 MED ORDER — INSULIN ASPART 100 UNIT/ML IJ SOLN
10.0000 [IU] | Freq: Three times a day (TID) | INTRAMUSCULAR | Status: DC
  Administered 2023-12-29 – 2023-12-30 (×4): 10 [IU] via SUBCUTANEOUS

## 2023-12-29 MED ORDER — LEVOTHYROXINE SODIUM 75 MCG PO TABS
150.0000 ug | ORAL_TABLET | Freq: Every day | ORAL | Status: DC
  Administered 2023-12-30 – 2024-01-02 (×4): 150 ug via ORAL
  Filled 2023-12-29 (×4): qty 2

## 2023-12-29 MED ORDER — FUROSEMIDE 10 MG/ML IJ SOLN
120.0000 mg | Freq: Once | INTRAVENOUS | Status: AC
  Administered 2023-12-29: 120 mg via INTRAVENOUS
  Filled 2023-12-29: qty 10

## 2023-12-29 MED ORDER — INSULIN REGULAR HUMAN (CONC) 500 UNIT/ML ~~LOC~~ SOPN
150.0000 [IU] | PEN_INJECTOR | Freq: Two times a day (BID) | SUBCUTANEOUS | Status: DC
  Administered 2023-12-29 – 2023-12-30 (×3): 150 [IU] via SUBCUTANEOUS
  Filled 2023-12-29: qty 3

## 2023-12-29 NOTE — Assessment & Plan Note (Signed)
 No angina, elevation in high sensitive troponin due to volume overload.  No acute coronary syndrome.   Continue aspirin  and atorvastatin .

## 2023-12-29 NOTE — Assessment & Plan Note (Signed)
 Follow up as outpatient

## 2023-12-29 NOTE — Hospital Course (Addendum)
 [name] was admitted to the hospital with the working diagnosis of heart failure exacerbation.   33 yo female with the past medical history of hypertension, hyperlipidemia, T2Dm, chronic pancreatitis and heart failure who presented with abdominal distention and rapid weight gain.  Recent hospitalization for heat failure 09/11 to 12/14/23 for heart failure exacerbation.  Apparently at home she has not been compliant with her medications.  On the day of hospitalization she was evaluated at the outpatient cardiology clinic and found volume overloaded with uncontrolled hypertension and tachycardia. 7 lbs weight gain since she was discharged from the hospital in September.  She was referred to the hospital for further diuresis.  On her initial physical examination her blood pressure was 137/82, HR 133, RR 26 and 02 saturation 97% Lungs with no wheezing or rhonchi, heart with S1 and S2 present and tachycardic, abdomen distended but not tender, no lower extremity edema.    Na 135, K 4.7 Cl 104, glucose 152 bun 67 cr 2,10  High sensitive troponin 21 and 20  Wbc 10,4 hgb 11.4 plt 281   Chest radiograph with hypoinflation, positive cardiomegaly, bilateral hilar vascular congestion and small central bilateral interstitial infiltrates.   EKG 135 bpm, left axis deviation, normal intervals qtc 489, sinus rhythm with right atrial enlargement, no significant ST segment or T wave changes, positive LVH.   10/05 improved volume status, pending renal function to improve prior to discharge,  10/06 worsening renal function, checking for ascites.

## 2023-12-29 NOTE — Assessment & Plan Note (Signed)
 No acute flare, no abdominal pain, nausea or vomiting.

## 2023-12-29 NOTE — Inpatient Diabetes Management (Signed)
 Inpatient Diabetes Program Recommendations  AACE/ADA: New Consensus Statement on Inpatient Glycemic Control (2015)  Target Ranges:  Prepandial:   less than 140 mg/dL      Peak postprandial:   less than 180 mg/dL (1-2 hours)      Critically ill patients:  140 - 180 mg/dL   Lab Results  Component Value Date   GLUCAP 343 (H) 12/29/2023   HGBA1C 8.8 (H) 09/28/2023    Review of Glycemic Control  Latest Reference Range & Units 12/28/23 21:47 12/29/23 06:27 12/29/23 11:01  Glucose-Capillary 70 - 99 mg/dL 624 (H) 744 (H) 656 (H)   Diabetes history: DM  Outpatient Diabetes medications:  U500 insulin  300 units bid- dose verified with patient  Humalog  60 units tid with meals (2 units per 1 gram of CHO) Grams of carbs consumed Humalog  dose 10 grams 20 units 20 grams 40 units 30 grams 60 units 40 grams 80 units 50 grams 100 units  60 grams 120 units 70 grams 140 units 80 grams 160 units  Current orders for Inpatient glycemic control:  Novolog  0-9 units tid with meals and HS Novolog  6 units tid with meals  Lantus  25 units bid  Inpatient Diabetes Program Recommendations:         Spoke with patient at bedside.  She states that she takes U500 plus high doses of Novolog  with meals at home.  She sees endocrinology at [facility] and takes on average 480 units of insulin  daily.  She has extreme insulin  resistance.        Consider d/c of Lantus  and restart U500 150 units bid (1/2 of home dose). May also need to increase Meal coverage to 10-15 units tid with meals? This is a very unusual regimen but her history is also very unique.   Thanks,  Randall Bullocks, RN, BC-ADM Inpatient Diabetes Coordinator Pager [phone]  (8a-5p)

## 2023-12-29 NOTE — Assessment & Plan Note (Addendum)
 Resume home insulin  U500, will use 50% of her home dose, 150 units bid Patient had hypoglycemia, insulin  sliding scale was reduced in intensity.  Will resume pre meal insulin  10 units. She has a very difficult to control diabetes mellitus.   Patient with significant insulin  resistance.  Positive hyperglycemia.   Continue statin and fenofibrate .

## 2023-12-29 NOTE — Assessment & Plan Note (Signed)
 Continue levothyroxine 

## 2023-12-29 NOTE — Plan of Care (Signed)
   Problem: Coping: Goal: Ability to adjust to condition or change in health will improve Outcome: Progressing   Problem: Fluid Volume: Goal: Ability to maintain a balanced intake and output will improve Outcome: Progressing   Problem: Health Behavior/Discharge Planning: Goal: Ability to identify and utilize available resources and services will improve Outcome: Progressing

## 2023-12-29 NOTE — TOC Initial Note (Addendum)
 Transition of Care [facility]) - Initial/Assessment Note    Patient Details  Name: Jennifer [name] MRN: [id number] Date of Birth: September 17, 1990  Transition of Care [facility]) CM/SW Contact:    Justina Delcia Czar, RN Phone Number: 12/29/2023, 6:34 PM  Clinical Narrative:    Readmission Spoke to pt at bedside. States she adheres to daily weights and medications. Pt has outpatient HF Paramedicine.  Inpatient CM educated patient on the importance of daily weights and adhering to medical plan as prescribed.   Will arrange hospital follow up appt with PCP. Mother will provide transportation to home at dc.                Expected Discharge Plan: Home/Self Care Barriers to Discharge: Continued Medical Work up   Patient Goals and CMS Choice Patient states their goals for this hospitalization and ongoing recovery are:: wants to remain independent          Expected Discharge Plan and Services   Discharge Planning Services: CM Consult   Living arrangements for the past 2 months: Apartment                                      Prior Living Arrangements/Services Living arrangements for the past 2 months: Apartment Lives with:: Self Patient language and need for interpreter reviewed:: Yes Do you feel safe going back to the place where you live?: Yes      Need for Family Participation in Patient Care: No (Comment) Care giver support system in place?: No (comment) Current home services: DME (CPAP, scale) Criminal Activity/Legal Involvement Pertinent to Current Situation/Hospitalization: No - Comment as needed  Activities of Daily Living   ADL Screening (condition at time of admission) Independently performs ADLs?: Yes (appropriate for developmental age) Is the patient deaf or have difficulty hearing?: No Does the patient have difficulty seeing, even when wearing glasses/contacts?: No Does the patient have difficulty concentrating, remembering, or making decisions?: No  Permission  Sought/Granted Permission sought to share information with : Case Manager, Family Supports, PCP    Share Information with NAME: Ozzie Remmers  Permission granted to share info w AGENCY: PCP  Permission granted to share info w Relationship: mother  Permission granted to share info w Contact Information: [phone]  Emotional Assessment Appearance:: Appears stated age Attitude/Demeanor/Rapport: Engaged Affect (typically observed): Accepting Orientation: : Oriented to Self, Oriented to Place, Oriented to  Time, Oriented to Situation   Psych Involvement: No (comment)  Admission diagnosis:  Tachycardia [R00.0] Other hypervolemia [E87.79] 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) (HCC) [I50.23] Patient Active Problem List   Diagnosis Date Noted   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) (HCC) 12/28/2023   Hypertensive urgency 12/28/2023   Acute exacerbation of CHF (congestive heart failure) (HCC) 12/07/2023   Controlled type 2 diabetes mellitus with hyperglycemia (HCC) 08/31/2023   Nausea without vomiting 08/31/2023   Epigastric pain 08/31/2023   Sleep apnea 08/30/2023   Abscess 08/30/2023   SOB (shortness of breath) 08/20/2023   Dizziness 08/20/2023   Chronic pancreatitis (HCC) 08/15/2023   Witnessed episode of apnea 06/27/2023   Pseudohyponatremia 04/10/2023   Metabolic acidosis 04/10/2023   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 coli UTI (urinary tract infection) 06/01/2022   DKA (diabetic ketoacidosis) (HCC) 06/01/2022   ASCUS of cervix with negative high risk HPV 05/31/2022   Acute on chronic pancreatitis (HCC) 05/29/2022   Hypokalemia 03/23/2022   COVID 03/15/2022   COVID-19 virus infection 03/14/2022   Hyponatremia 03/14/2022   Positive D dimer 03/14/2022   Stage 3b chronic kidney disease (CKD) (HCC) 03/14/2022   Pulmonary hypertension (HCC) 02/06/2022   NASH (nonalcoholic steatohepatitis) 02/06/2022   Obesity (BMI 30-39.9) 02/06/2022   Heart failure (HCC) 12/03/2021   Acute on chronic diastolic CHF (congestive heart failure) (HCC) 12/02/2021   Elevated troponin 12/02/2021   Pancytopenia (HCC) 12/02/2021   Amenorrhea 12/02/2021   Hepatic steatosis 09/27/2021   Hyperosmolar hyperglycemic state (HHS) (HCC) 08/26/2021   Small intestinal bacterial overgrowth (SIBO) 08/26/2021   Lower extremity edema 08/26/2021   Hyperkalemia 08/14/2021   Hx of insulin  dependent diabetes mellitus 08/14/2021   CKD stage 3b, GFR 30-44 ml/min (HCC) 05/13/2021   Gastroparesis due to DM (HCC) 05/12/2021   Abdominal distension 05/12/2021   Chest pain 03/10/2021   Acute on chronic combined systolic and diastolic CHF (congestive heart failure) (HCC) 02/09/2021   History of astrocytoma of brain 02/09/2021   Type 2 diabetes mellitus with hyperglycemia, with long-term current use of insulin  (HCC) 01/25/2021   Diarrhea    Elevated transaminase level    Orthostatic hypotension 11/28/2020   Acute combined systolic and diastolic heart failure (HCC)    Nonischemic cardiomyopathy (HCC)    Acute systolic congestive heart failure (HCC)    Elevated liver enzymes    Acute renal failure superimposed on stage 3b chronic kidney disease (HCC) 11/26/2020   Intractable abdominal pain 11/23/2020   Chronic systolic congestive heart failure (HCC) 11/23/2020   Essential hypertension 11/23/2020   Familial  hypertriglyceridemia 11/23/2020   Prolonged QT interval 11/23/2020   Finger mass, left 08/12/2020   Nausea and vomiting 07/23/2020   Polycystic ovary syndrome 01/30/2020   Moderate aortic insufficiency 08/16/2019   Moderate mitral regurgitation 08/16/2019   Lipoprotein lipase deficiency, familial 06/19/2019   Microalbuminuria due to type 1 diabetes mellitus (HCC) 06/19/2019   Type 2 diabetes mellitus with hyperlipidemia (HCC) 10/10/2018   Hypothyroidism 10/10/2018   PCP:  Emilio Joesph DEL PA-C Pharmacy:   [facility] - [place], [place] - [address] [place] [address] Phone: [phone] Fax: [phone]     Social Drivers of Health (SDOH) Social History: SDOH Screenings   Food Insecurity: No Food Insecurity (12/28/2023)  Housing: Low Risk  (12/28/2023)  Transportation Needs: No Transportation Needs (12/28/2023)  Utilities: Not At Risk (12/28/2023)  Alcohol  Screen: Low Risk  (12/10/2021)  Depression (PHQ2-9): Low Risk  (12/10/2021)  Financial Resource Strain: Low Risk  (10/24/2023)   Received from [facility]  Physical Activity: Unknown (10/24/2023)   Received from [facility]  Social Connections: Patient Declined (10/24/2023)   Received from [facility]  Stress: Patient Declined (10/24/2023)   Received from [facility]  Tobacco Use: Low Risk  (12/28/2023)   SDOH Interventions:     Readmission Risk Interventions    12/11/2023    3:37 PM 08/21/2023    2:55 PM 05/08/2023  4:21 PM  Readmission Risk Prevention Plan  Transportation Screening Complete Complete Complete  Medication Review Oceanographer) Complete Complete Referral to Pharmacy  PCP or Specialist appointment within 3-5 days of discharge  Complete Complete  HRI or Home Care Consult Complete Complete Complete  SW Recovery Care/Counseling Consult  Complete Complete  Palliative Care Screening Not Applicable Not Applicable Not Applicable  Skilled Nursing Facility  Not Applicable Complete Not Applicable

## 2023-12-29 NOTE — Assessment & Plan Note (Signed)
 Continue losartan  for blood pressure control.

## 2023-12-29 NOTE — Assessment & Plan Note (Addendum)
 Hyponatremia, baseline cr 2,5 to 3.0   Today renal function with serum cr at 3,73 with K at 5.0 and serum bicarbonate at 19  Na 130   Plan to continue close follow up renal function and electrolytes.  Hold on furosemide  for now

## 2023-12-29 NOTE — Assessment & Plan Note (Addendum)
 Cpap  Depression continue with duloxetine .

## 2023-12-29 NOTE — Plan of Care (Signed)
  Problem: Coping: Goal: Ability to adjust to condition or change in health will improve Outcome: Progressing   Problem: Nutritional: Goal: Maintenance of adequate nutrition will improve Outcome: Progressing   Problem: Skin Integrity: Goal: Risk for impaired skin integrity will decrease Outcome: Progressing   Problem: Tissue Perfusion: Goal: Adequacy of tissue perfusion will improve Outcome: Progressing   Problem: Education: Goal: Knowledge of General Education information will improve Description: Including pain rating scale, medication(s)/side effects and non-pharmacologic comfort measures Outcome: Progressing   Problem: Clinical Measurements: Goal: Ability to maintain clinical measurements within normal limits will improve Outcome: Progressing Goal: Will remain free from infection Outcome: Progressing Goal: Diagnostic test results will improve Outcome: Progressing Goal: Respiratory complications will improve Outcome: Progressing Goal: Cardiovascular complication will be avoided Outcome: Progressing   Problem: Activity: Goal: Risk for activity intolerance will decrease Outcome: Progressing   Problem: Nutrition: Goal: Adequate nutrition will be maintained Outcome: Progressing   Problem: Coping: Goal: Level of anxiety will decrease Outcome: Progressing   Problem: Elimination: Goal: Will not experience complications related to bowel motility Outcome: Progressing Goal: Will not experience complications related to urinary retention Outcome: Progressing   Problem: Pain Managment: Goal: General experience of comfort will improve and/or be controlled Outcome: Progressing   Problem: Safety: Goal: Ability to remain free from injury will improve Outcome: Progressing   Problem: Skin Integrity: Goal: Risk for impaired skin integrity will decrease Outcome: Progressing

## 2023-12-29 NOTE — Progress Notes (Addendum)
 Advanced Heart Failure Rounding Note  Cardiologist: [name], MD  AHF: Dr. [name]  Chief Complaint: A/c Systolic Heart Failure Subjective:    BP improved w/ restart of hydral/Imdur .  Remains tachycardic in the 130s, sinus. Denies dyspnea. O2 sats stable on RA. She reports sharp left sided shoulder pain, HS 21>>20. Asking for pain meds.   Only 500 cc in UOP charted yesterday but she reports good urinary response to IV Lasix .    Scr 2.10>>1.66 K 5.0   Na corrects to 133 (Gluc 322).   Objective:   Weight Range: 65.5 kg Body mass index is 31.27 kg/m.   Vital Signs:   Temp:  [97.8 F (36.6 C)-98.9 F (37.2 C)] 98.9 F (37.2 C) (10/03 0720) Pulse Rate:  [121-134] 133 (10/03 0720) Resp:  [13-26] 20 (10/03 0720) BP: (115-177)/(60-104) 129/60 (10/03 0720) SpO2:  [95 %-100 %] 95 % (10/03 0720) FiO2 (%):  [21 %] 21 % (10/03 0043) Weight:  [64 kg-65.5 kg] 65.5 kg (10/02 2150) Last BM Date : 12/28/23  Weight change: Filed Weights   12/28/23 1500 12/28/23 2150  Weight: 64 kg 65.5 kg    Intake/Output:   Intake/Output Summary (Last 24 hours) at 12/29/2023 0843 Last data filed at 12/29/2023 0408 Gross per 24 hour  Intake 120 ml  Output 500 ml  Net -380 ml      Physical Exam   GENERAL: chronically ill appearing female, NAD Lungs- diminished at the bases bilaterally  CARDIAC:  JVP: 12 cm          Regular rhythm, tachy rate. No LEE  ABDOMEN: distended, Soft, non-tender EXTREMITIES: Warm and well perfused.  NEUROLOGIC: No obvious FND  Telemetry   Sinus tach 130s, personally reviewed   EKG    N/A   Labs    CBC Recent Labs    12/28/23 1425 12/28/23 1609 12/29/23 0211  WBC 10.4  --  8.8  HGB 11.4* 11.9* 11.8*  HCT 34.3* 35.0* 31.2*  MCV 88.6  --  89.3  PLT 281  --  183   Basic Metabolic Panel Recent Labs    89/97/74 1609 12/29/23 0211  NA 135 128*  K 4.7 5.0  CL 104 94*  CO2  --  21*  GLUCOSE 152* 322*  BUN 67* 50*  CREATININE 2.10*  1.66*  CALCIUM   --  8.8*  MG  --  2.3   Liver Function Tests No results for input(s): AST, ALT, ALKPHOS, BILITOT, PROT, ALBUMIN  in the last 72 hours. No results for input(s): LIPASE, AMYLASE in the last 72 hours. Cardiac Enzymes No results for input(s): CKTOTAL, CKMB, CKMBINDEX, TROPONINI in the last 72 hours.  BNP: BNP (last 3 results) Recent Labs    11/10/23 1238 11/10/23 1627 12/28/23 1425  BNP 27.7 20.9 37.4    ProBNP (last 3 results) Recent Labs    12/07/23 0018  PROBNP 433.0*     D-Dimer No results for input(s): DDIMER in the last 72 hours. Hemoglobin A1C No results for input(s): HGBA1C in the last 72 hours. Fasting Lipid Panel No results for input(s): CHOL, HDL, LDLCALC, TRIG, CHOLHDL, LDLDIRECT in the last 72 hours. Thyroid  Function Tests No results for input(s): TSH, T4TOTAL, T3FREE, THYROIDAB in the last 72 hours.  Invalid input(s): FREET3  Other results:   Imaging    DG Chest 2 View Result Date: 12/28/2023 CLINICAL DATA:  Chest pain, tachycardia and hypertension. EXAM: CHEST - 2 VIEW COMPARISON:  12/07/2023 FINDINGS: The heart size and mediastinal contours  are within normal limits. Stable appearance of CardioMEMS device in a left posterior lower lobe pulmonary artery branch. Stable mild bibasilar atelectasis. There is no evidence of pulmonary edema, consolidation, pneumothorax, nodule or pleural fluid. The visualized skeletal structures are unremarkable. IMPRESSION: Stable mild bibasilar atelectasis. No acute findings. Electronically Signed   By: Marcey Moan M.D.   On: 12/28/2023 15:17     Medications:     Scheduled Medications:  aspirin  EC  81 mg Oral Daily   atorvastatin   80 mg Oral QPM   calcitRIOL   0.25 mcg Oral Daily   DULoxetine   60 mg Oral QHS   fenofibrate   54 mg Oral Daily   heparin   5,000 Units Subcutaneous Q8H   hydrALAZINE   100 mg Oral Q8H   insulin  aspart  0-5 Units Subcutaneous  QHS   insulin  aspart  0-9 Units Subcutaneous TID WC   insulin  aspart  6 Units Subcutaneous TID WC   insulin  glargine  25 Units Subcutaneous BID   isosorbide  mononitrate  60 mg Oral Daily   ivabradine   7.5 mg Oral BID WC   levothyroxine   150 mcg Oral QAC breakfast   pantoprazole   40 mg Oral BID AC   pregabalin   300 mg Oral QHS   sodium chloride  flush  3 mL Intravenous Q12H    Infusions:   PRN Medications: acetaminophen  **OR** acetaminophen , prochlorperazine , senna    Patient Profile   33 y.o. female with history of HFrEF/NICM, CAD, CKD IV, type 3c DM, severe hypertrigyceridemia 2/2 LPL deficiency, OSA, uncontrolled HTN.    Admitted with acute on chronic HFmrEF and uncontrolled HTN in setting of nonadherence.  Assessment/Plan   HFrEF now with HFmrEF/nonischemic cardiomyopathy - prev reduced EF to 29%. Now stable EF 45-55% since 2022. With fluctuant RV function. - most recent echo 9/25: EF 45-50%, RV mildly reduced - RHC 9/25 RA 6, PA 46/23 (mean 31), PCWP 13, FICK CI 1.8, TPG 18, PVR 6.4 WU, PAPi 3.8, severely elevated SVR 2857 - Hydralazine  titrated post RHC for further afterload reduction but pt has been noncompliant w/ meds/diuretics since d/c, per paramedicine report. Also noncompliant w/ sending daily cardioMEMs transmissions.  - Now readmitted w/ NYHA Class III symptoms, marked volume overload in setting of med noncompliance - Diuresing w/ IV Lasix  but remains volume overloaded. SCr trending down. Continue IV Lasix  120 mg bid today  - Continue hydralazine  100 mg TID for afterload reduction + Imdur  60 mg  - Restart home corlanor  7.5 mg BID. Not on ? blocker given recent low CI on RHC  - GDMT limited by CKD   2. CAD - Single vessel RCA occlusion (small co-dominant vessel) on cardiac cath 09/08. - she reports mild chest pain in setting of marked hypertension and volume overload. HS troponin just mildly elevated at 21>>20  - Continue medical management.  - Continue ASA 81 +  Atorva 80 mg  - Previously on repatha  but insurance stopped covering. Has upcoming appointment with lipid clinic.   3.  CKD IV - Previously followed by Dr. Kimble (Nephrology at [facility]), now followed by [facility] (Dr. Louella) - Had renal biopsy (10/23) and showed DM and HTN nephropathy. - Scr baseline variable with frequent AKIs, typically 2.0-2.5 - AKI last month with Scr up to 3.7 - SCr on this admission 2.10. Improving w/ diuresis, down to 1.66 today  - follow BMP     4. Pulmonary hypertension - RHC RHC 9/25 RA 6, PA 46/23 (mean 31), PCWP 13, FICK CI 1.8, TPG 18,  PVR 6.4 WU, PAPi 3.8, severely elevated SVR 2857 - Echo 9/25, RV mildly reduced  - V/Q negative - Off sildenafil  d/t dizziness and low BP   5. Uncontrolled HTN - In setting of noncompliance with home meds (see paramedicine encounters).  - Improved today w/ restart of meds    7. OSA - per [facility] - has CPAP but compliance poor    8. Severe hyperTG - Due to LPL deficiency w/ hx pancreatitis. - Follows in Lipid Clinic (Dr. Mona)  - fenofibrate  discontinued sometime during last admission. Rationale not clear. TG > 4,000 last admission. Will restart renally dosed fenofibrate  at 54 mg daily and increase home atorvastatin  to 80 (held off on rosuvastatin  d/t kidney function)   9. Type 3c DM - 2/2 chronic pancreatitis  - Follows with [facility] - On insulin    10. Chronic abdominal swelling/bloating - Seen at [facility] (11/20) for IBS, per chart review. - Had penumatosis intestinalis. She was started on pancreatic enzyme replacement. - Has DM gastroparesis - Suspect primarily fatty liver   11. Poor Med Compliance/SDOH - recently re-enrolled in Paramedicine program but c/w poor compliance   Length of Stay: 1  Jennifer Simmons, PA-C  12/29/2023, 8:43 AM  Advanced Heart Failure Team Pager [phone] (M-F; 7a - 5p)  Please contact [facility] for night-coverage after hours (5p -7a ) and weekends on  [email or web address]  Patient seen with PA, I formulated the plan and agree with the above note.   She was admitted with dyspnea, chest pain, and elevated BP.  Not taking her meds at home.  She says she diuresed well overnight but I/Os not fully recorded.    Today, creatinine lower at 1.66.  She says that she feels better. BP is controlled.   General: NAD Neck: JVP difficult but still appears elevated, no thyromegaly or thyroid  nodule.  Lungs: Clear to auscultation bilaterally with normal respiratory effort. CV: Nondisplaced PMI.  Heart regular S1/S2, no S3/S4, no murmur.  No peripheral edema.   Abdomen: Soft, nontender, no hepatosplenomegaly, no distention.  Skin: Intact without lesions or rashes.  Neurologic: Alert and oriented x 3.  Psych: Normal affect. Extremities: No clubbing or cyanosis.  HEENT: Normal.   Noncompliant with medications.  We now have her back on hydralazine  100 mg tid and Imdur  60 mg daily, restarting her Corlanor  today.  BP now controlled.   Suspect she still has some volume overload.  Lasix  120 mg IV bid today, probably back to torsemide  60 mg daily tomorrow.  She was not compliant with the torsemide  at home.   Ezra Shuck 12/29/2023 11:23 AM

## 2023-12-29 NOTE — Assessment & Plan Note (Addendum)
 Echocardiogram with mildly reduced LV systolic function with EF 45 to 50%, mild LVH, grade II diastolic dysfunction (pseudo-normalization EA), RVSP 31.7 mmHg,    Patient was placed on IV furosemide  for diuresis, negative fluid balance was achieved, -5.011 ml, with significant improvement in her symptoms.   Continue with ivabradine  for tachycardia.  Afterload reduction with hydralazine  and isosorbide . Resume loop diuretic at the time of discharge.  Patient has been advised to be adherent to her medications.

## 2023-12-29 NOTE — Progress Notes (Signed)
 Progress Note   Patient: Jennifer [name] FMW:[id number] DOB: 1991/01/25 DOA: 12/28/2023     1 DOS: the patient was seen and examined on 12/29/2023   Brief hospital course: Jennifer [name] was admitted to the hospital with the working diagnosis of heart failure exacerbation.   33 yo female with the past medical history of hypertension, hyperlipidemia, T2Dm, chronic pancreatitis and heart failure who presented with abdominal distention and rapid weight gain.  Recent hospitalization for heat failure 09/11 to 12/14/23 for heart failure exacerbation.  Apparently at home she has not been compliant with her medications.  On the day of hospitalization she was evaluated at the outpatient cardiology clinic and found volume overloaded with uncontrolled hypertension and tachycardia. 7 lbs weight gain since she was discharged from the hospital in September.  She was referred to the hospital for further diuresis.  On her initial physical examination her blood pressure was 137/82, HR 133, RR 26 and 02 saturation 97% Lungs with no wheezing or rhonchi, heart with S1 and S2 present and tachycardic, abdomen distended but not tender, no lower extremity edema.    Na 135, K 4.7 Cl 104, glucose 152 bun 67 cr 2,10  High sensitive troponin 21 and 20  Wbc 10,4 hgb 11.4 plt 281   Chest radiograph with hypoinflation, positive cardiomegaly, bilateral hilar vascular congestion and small central bilateral interstitial infiltrates.   EKG 135 bpm, left axis deviation, normal intervals qtc 489, sinus rhythm with right atrial enlargement, no significant ST segment or T wave changes, positive LVH.     Assessment and Plan: * Acute on chronic diastolic CHF (congestive heart failure) (HCC) Echocardiogram with mildly reduced LV systolic function with EF 45 to 50%, mild LVH, grade II diastolic dysfunction (pseudo-normalization EA), RVSP 31.7 mmHg,    Documented urine output is 500 cc Systolic blood pressure 130 mmHg  range.  Plan to continue diuresis with IV furosemide  120 mg IV x1 dose Continue with ivabradine  for tachycardia.  Afterload reduction with hydralazine  and isosorbide .   Essential hypertension Continue blood pressure monitoring   Coronary artery disease involving native coronary artery of native heart without angina pectoris No angina, elevation in high sensitive troponin due to volume overload.  No acute coronary syndrome.   Continue aspirin  and atorvastatin .   Stage 3b chronic kidney disease (CKD) (HCC) Hyponatremia,   Renal function with serum cr at 1,66 with K at 5.0 and serum bicarbonate at 21  Na 128  Mg 2.3   Plan to continue close follow up renal function and electrolytes.   Chronic pancreatitis (HCC) No acute flare, no abdominal pain, nausea or vomiting.   History of Jennifer [name] of brain Follow up as outpatient.   Type 2 diabetes mellitus with hyperlipidemia (HCC) Resume home insulin  U500, will use 50% of her home dose, 150 units bid Continue pre meal insulin  coverage with 10 units TID Continue insulin  sliding scale.  Patient with significant insulin  resistance.   Continue statin   Hypothyroidism Continue levothyroxine    Sleep apnea Cpap      Subjective: Patient with improved dyspnea and abdominal distention, no chest pain, no palpitations   Physical Exam: Vitals:   12/29/23 0406 12/29/23 0538 12/29/23 0720 12/29/23 1101  BP: 127/68 127/68 129/60 133/71  Pulse: (!) 122  (!) 133 (!) 133  Resp: 20  20 20   Temp: 97.8 F (36.6 C)  98.9 F (37.2 C) 98.8 F (37.1 C)  TempSrc: Oral  Oral Oral  SpO2: 96%  95%   Weight:  Height:       Neurology awake and alert ENT with mild pallor Cardiovascular with S1 and S2 present and tachycardic, with systolic murmur at the left lower sternal border No JVD Respiratory with mild rales at bases with no wheezing or rhonchi  Abdomen with no distention, soft and non tender No lower extremity edema   Data  Reviewed:    Family Communication: no family at the bedside   Disposition: Status is: Inpatient Remains inpatient appropriate because: iv diuresis   Planned Discharge Destination: Home    Author: Elidia Toribio Furnace, MD 12/29/2023 1:29 PM  For on call review [email or web address].

## 2023-12-30 DIAGNOSIS — I5033 Acute on chronic diastolic (congestive) heart failure: Secondary | ICD-10-CM | POA: Diagnosis not present

## 2023-12-30 DIAGNOSIS — I10 Essential (primary) hypertension: Secondary | ICD-10-CM | POA: Diagnosis not present

## 2023-12-30 DIAGNOSIS — N1832 Chronic kidney disease, stage 3b: Secondary | ICD-10-CM | POA: Diagnosis not present

## 2023-12-30 DIAGNOSIS — I2510 Atherosclerotic heart disease of native coronary artery without angina pectoris: Secondary | ICD-10-CM | POA: Diagnosis not present

## 2023-12-30 LAB — BASIC METABOLIC PANEL WITH GFR
Anion gap: 18 — ABNORMAL HIGH (ref 5–15)
BUN: 67 mg/dL — ABNORMAL HIGH (ref 6–20)
CO2: 20 mmol/L — ABNORMAL LOW (ref 22–32)
Calcium: 9.2 mg/dL (ref 8.9–10.3)
Chloride: 93 mmol/L — ABNORMAL LOW (ref 98–111)
Creatinine, Ser: 2.62 mg/dL — ABNORMAL HIGH (ref 0.44–1.00)
GFR, Estimated: 24 mL/min — ABNORMAL LOW (ref >60)
Glucose, Bld: 354 mg/dL — ABNORMAL HIGH (ref 70–99)
Potassium: 4.5 mmol/L (ref 3.5–5.1)
Sodium: 131 mmol/L — ABNORMAL LOW (ref 135–145)

## 2023-12-30 LAB — GLUCOSE, CAPILLARY
Glucose-Capillary: 108 mg/dL — ABNORMAL HIGH (ref 70–99)
Glucose-Capillary: 109 mg/dL — ABNORMAL HIGH (ref 70–99)
Glucose-Capillary: 109 mg/dL — ABNORMAL HIGH (ref 70–99)
Glucose-Capillary: 288 mg/dL — ABNORMAL HIGH (ref 70–99)
Glucose-Capillary: 293 mg/dL — ABNORMAL HIGH (ref 70–99)
Glucose-Capillary: 349 mg/dL — ABNORMAL HIGH (ref 70–99)
Glucose-Capillary: 373 mg/dL — ABNORMAL HIGH (ref 70–99)

## 2023-12-30 LAB — MAGNESIUM: Magnesium: 1.9 mg/dL (ref 1.7–2.4)

## 2023-12-30 MED ORDER — INSULIN ASPART 100 UNIT/ML IJ SOLN
5.0000 [IU] | Freq: Once | INTRAMUSCULAR | Status: AC
  Administered 2023-12-30: 5 [IU] via SUBCUTANEOUS

## 2023-12-30 MED ORDER — TORSEMIDE 20 MG PO TABS
60.0000 mg | ORAL_TABLET | Freq: Every day | ORAL | Status: DC
  Administered 2023-12-30 – 2023-12-31 (×2): 60 mg via ORAL
  Filled 2023-12-30 (×2): qty 3

## 2023-12-30 MED ORDER — INSULIN ASPART 100 UNIT/ML IJ SOLN: 0.0000 [IU] | Freq: Three times a day (TID) | INTRAMUSCULAR | Status: DC

## 2023-12-30 NOTE — Progress Notes (Addendum)
 Cardiologist: [name], MD  AHF: Dr. [name]  Chief Complaint: A/c Systolic Heart Failure Subjective:    Feeling better today, no complaints of chest pain, SOB or edema   Scr 2.10>>1.66-> 2.6 K 5.0 -> 4.5   Objective:   Weight Range: 65.5 kg Body mass index is 31.27 kg/m.   Vital Signs:   Temp:  [98.1 F (36.7 C)-98.8 F (37.1 C)] 98.1 F (36.7 C) (10/04 0758) Pulse Rate:  [89-133] 94 (10/04 0758) Resp:  [18-20] 20 (10/04 0758) BP: (117-142)/(65-81) 117/69 (10/04 0758) SpO2:  [95 %-99 %] 99 % (10/04 0758) FiO2 (%):  [21 %] 21 % (10/03 2320) Last BM Date : 12/29/23  Weight change: Filed Weights   12/28/23 1500 12/28/23 2150  Weight: 64 kg 65.5 kg    Intake/Output:   Intake/Output Summary (Last 24 hours) at 12/30/2023 1058 Last data filed at 12/29/2023 2118 Gross per 24 hour  Intake 120 ml  Output 1600 ml  Net -1480 ml      Physical Exam   Physical Exam Vitals and nursing note reviewed.  Constitutional:      Appearance: Normal appearance.  HENT:     Head: Normocephalic.  Cardiovascular:     Rate and Rhythm: Normal rate and regular rhythm.  Pulmonary:     Effort: Pulmonary effort is normal.     Breath sounds: Normal breath sounds.  Musculoskeletal:        General: No swelling or tenderness.  Skin:    Coloration: Skin is not jaundiced or pale.  Neurological:     Mental Status: [name] alert.      Telemetry   NSR, personally reviewed   EKG    N/A   Labs    CBC Recent Labs    12/28/23 1425 12/28/23 1609 12/29/23 0211  WBC 10.4  --  8.8  HGB 11.4* 11.9* 11.8*  HCT 34.3* 35.0* 31.2*  MCV 88.6  --  89.3  PLT 281  --  183   Basic Metabolic Panel Recent Labs    89/96/74 0211 12/30/23 0234  NA 128* 131*  K 5.0 4.5  CL 94* 93*  CO2 21* 20*  GLUCOSE 322* 354*  BUN 50* 67*  CREATININE 1.66* 2.62*  CALCIUM  8.8* 9.2  MG 2.3 1.9   Liver Function Tests No results for input(s): AST, ALT, ALKPHOS, BILITOT, PROT,  ALBUMIN  in the last 72 hours. No results for input(s): LIPASE, AMYLASE in the last 72 hours. Cardiac Enzymes No results for input(s): CKTOTAL, CKMB, CKMBINDEX, TROPONINI in the last 72 hours.  BNP: BNP (last 3 results) Recent Labs    11/10/23 1238 11/10/23 1627 12/28/23 1425  BNP 27.7 20.9 37.4    ProBNP (last 3 results) Recent Labs    12/07/23 0018  PROBNP 433.0*     D-Dimer No results for input(s): DDIMER in the last 72 hours. Hemoglobin A1C No results for input(s): HGBA1C in the last 72 hours. Fasting Lipid Panel No results for input(s): CHOL, HDL, LDLCALC, TRIG, CHOLHDL, LDLDIRECT in the last 72 hours. Thyroid  Function Tests No results for input(s): TSH, T4TOTAL, T3FREE, THYROIDAB in the last 72 hours.  Invalid input(s): FREET3  Other results:   Imaging    No results found.    Medications:     Scheduled Medications:  aspirin  EC  81 mg Oral Daily   atorvastatin   80 mg Oral QPM   calcitRIOL   0.25 mcg Oral Daily   DULoxetine   60 mg Oral QHS  fenofibrate   54 mg Oral Daily   heparin   5,000 Units Subcutaneous Q8H   hydrALAZINE   100 mg Oral Q8H   insulin  aspart  0-15 Units Subcutaneous TID WC   insulin  aspart  0-5 Units Subcutaneous QHS   insulin  aspart  10 Units Subcutaneous TID WC   insulin  regular human CONCENTRATED  150 Units Subcutaneous BID WC   isosorbide  mononitrate  60 mg Oral Daily   ivabradine   7.5 mg Oral BID WC   levothyroxine   150 mcg Oral Q0600   pantoprazole   40 mg Oral BID AC   pregabalin   300 mg Oral QHS   sodium chloride  flush  3 mL Intravenous Q12H   torsemide   60 mg Oral Daily    Infusions:   PRN Medications: acetaminophen  **OR** acetaminophen , prochlorperazine , senna    Patient Profile   33 y.o. female with history of HFrEF/NICM, CAD, CKD IV, type 3c DM, severe hypertrigyceridemia 2/2 LPL deficiency, OSA, uncontrolled HTN.    Admitted with acute on chronic HFmrEF and  uncontrolled HTN in setting of nonadherence.  Assessment/Plan   HFrEF now with HFmrEF/nonischemic cardiomyopathy - prev reduced EF to 29%. Now stable EF 45-55% since 2022. With fluctuant RV function. - most recent echo 9/25: EF 45-50%, RV mildly reduced - RHC 9/25 RA 6, PA 46/23 (mean 31), PCWP 13, FICK CI 1.8, TPG 18, PVR 6.4 WU, PAPi 3.8, severely elevated SVR 2857 - Hydralazine  titrated post RHC for further afterload reduction but pt has been noncompliant w/ meds/diuretics since d/c, per paramedicine report. Also noncompliant w/ sending daily cardioMEMs transmissions.  - Now readmitted w/ NYHA Class III symptoms, marked volume overload in setting of med noncompliance - Transitioning to torsemide  60 mg today - Continue hydralazine  100 mg TID for afterload reduction + Imdur  60 mg  - On home corlanor  7.5 mg BID. Not on ? blocker given recent low CI on RHC  - GDMT limited by CKD   2. CAD - Single vessel RCA occlusion (small co-dominant vessel) on cardiac cath 09/08. - she reports mild chest pain in setting of marked hypertension and volume overload. HS troponin just mildly elevated at 21>>20  - Continue medical management.  - Continue ASA 81 + Atorva 80 mg  - Previously on repatha  but insurance stopped covering. Has upcoming appointment with lipid clinic.   3.  CKD IV - Previously followed by Dr. Kimble (Nephrology at [facility]), now followed by [facility] (Dr. Louella) - Had renal biopsy (10/23) and showed DM and HTN nephropathy. - Scr baseline variable with frequent AKIs, typically 2.0-2.5 - AKI last month with Scr up to 3.7 - SCr on this admission 2.10. Improving w/ diuresis, previously down to 1.66 and now back up  - follow BMP    4. Pulmonary hypertension - RHC RHC 9/25 RA 6, PA 46/23 (mean 31), PCWP 13, FICK CI 1.8, TPG 18, PVR 6.4 WU, PAPi 3.8, severely elevated SVR 2857 - Echo 9/25, RV mildly reduced  - V/Q negative - Off sildenafil  d/t dizziness and low BP   5. Uncontrolled HTN - In  setting of noncompliance with home meds (see paramedicine encounters).  - Improved w/ restart of meds    7. OSA - per [facility] - has CPAP but compliance poor    8. Severe hyperTG - Due to LPL deficiency w/ hx pancreatitis. - Follows in [facility] (Dr. Mona)  - fenofibrate  discontinued sometime during last admission. Rationale not clear. TG > 4,000 last admission. Now on renally dosed fenofibrate  at  54 mg daily and increase home atorvastatin  to 80 (held off on rosuvastatin  d/t kidney function)   9. Type 3c DM - 2/2 chronic pancreatitis  - Follows with [facility] - On insulin    10. Chronic abdominal swelling/bloating - Seen at [facility] (11/20) for IBS, per chart review. - Had penumatosis intestinalis. She was started on pancreatic enzyme replacement. - Has DM gastroparesis - Suspect primarily fatty liver   11. Poor Med Compliance/SDOH - recently re-enrolled in Paramedicine program but c/w poor compliance   Length of Stay: 2  Emeline FORBES Calender, MD  12/30/2023, 10:58 AM   Emeline FORBES Calender 12/30/2023 10:58 AM

## 2023-12-30 NOTE — Progress Notes (Signed)
 Progress Note   Patient: Jennifer [name] FMW:[id number] DOB: 10-09-1990 DOA: 12/28/2023     2 DOS: the patient was seen and examined on 12/30/2023   Brief hospital course: Mrs. Mclaurin was admitted to the hospital with the working diagnosis of heart failure exacerbation.   33 yo female with the past medical history of hypertension, hyperlipidemia, T2Dm, chronic pancreatitis and heart failure who presented with abdominal distention and rapid weight gain.  Recent hospitalization for heat failure 09/11 to 12/14/23 for heart failure exacerbation.  Apparently at home she has not been compliant with her medications.  On the day of hospitalization she was evaluated at the outpatient cardiology clinic and found volume overloaded with uncontrolled hypertension and tachycardia. 7 lbs weight gain since she was discharged from the hospital in September.  She was referred to the hospital for further diuresis.  On her initial physical examination her blood pressure was 137/82, HR 133, RR 26 and 02 saturation 97% Lungs with no wheezing or rhonchi, heart with S1 and S2 present and tachycardic, abdomen distended but not tender, no lower extremity edema.    Na 135, K 4.7 Cl 104, glucose 152 bun 67 cr 2,10  High sensitive troponin 21 and 20  Wbc 10,4 hgb 11.4 plt 281   Chest radiograph with hypoinflation, positive cardiomegaly, bilateral hilar vascular congestion and small central bilateral interstitial infiltrates.   EKG 135 bpm, left axis deviation, normal intervals qtc 489, sinus rhythm with right atrial enlargement, no significant ST segment or T wave changes, positive LVH.     Assessment and Plan: * Acute on chronic diastolic CHF (congestive heart failure) (HCC) Echocardiogram with mildly reduced LV systolic function with EF 45 to 50%, mild LVH, grade II diastolic dysfunction (pseudo-normalization EA), RVSP 31.7 mmHg,    Documented urine output is 2400 cc Systolic blood pressure 115 mmHg  range.  Transition to torsemide  60 mg po daily per cardiology recommendations.  Continue with ivabradine  for tachycardia.  Afterload reduction with hydralazine  and isosorbide .   Essential hypertension Continue blood pressure monitoring   Coronary artery disease involving native coronary artery of native heart without angina pectoris No angina, elevation in high sensitive troponin due to volume overload.  No acute coronary syndrome.   Continue aspirin  and atorvastatin .   Stage 3b chronic kidney disease (CKD) (HCC) Hyponatremia, baseline cr 2,5 to 3.   Renal function with serum cr at 2,6 with K at 4,5 and serum bicarbonate at 20  Na 131 Mg 1,9   Plan to continue close follow up renal function and electrolytes.  Transition to torsemide  60 mg po daily.   Chronic pancreatitis (HCC) No acute flare, no abdominal pain, nausea or vomiting.   History of astrocytoma of brain Follow up as outpatient.   Type 2 diabetes mellitus with hyperlipidemia (HCC) Resume home insulin  U500, will use 50% of her home dose, 150 units bid Continue pre meal insulin  coverage with 10 units TID Continue insulin  sliding scale, increase dose to moderate intensity.  Patient with significant insulin  resistance.  Positive hyperglycemia.   Continue statin   Hypothyroidism Continue levothyroxine    Sleep apnea Cpap        Subjective: Patient with improvement in dyspnea, no chest pain, no PND or orthopnea   Physical Exam: Vitals:   12/30/23 0028 12/30/23 0431 12/30/23 0636 12/30/23 0758  BP: (!) 140/75 129/76 129/76 117/69  Pulse: 89 95  94  Resp: 19 18  20   Temp: 98.4 F (36.9 C) 98.3 F (36.8 C)  98.1  F (36.7 C)  TempSrc: Axillary Oral  Oral  SpO2: 98% 96%  99%  Weight:      Height:       Neurology awake and alert ENT with mild pallor with no icterus Cardiovascular with S1 and S2 present and regular with no gallops rubs or murmurs Respiratory with no rales or wheezing, no rhonchi   Abdomen with no distention  Trace lower extremity edema  Data Reviewed:    Family Communication: no family at the bedside   Disposition: Status is: Inpatient Remains inpatient appropriate because: heart failure   Planned Discharge Destination: Home     Author: Elidia Toribio Furnace, MD 12/30/2023 10:23 AM  For on call review [email or web address].

## 2023-12-30 NOTE — Progress Notes (Signed)
   12/29/23 2219  Provider Notification  Provider Name/Title Dr. Alfornia  Date Provider Notified 12/29/23  Time Provider Notified 2219  Method of Notification Page (Secure chat)  Notification Reason Other (Comment) (CBG 422)  Provider response Evaluate remotely;No new orders (to recheck CBG)  Date of Provider Response 12/30/23  Time of Provider Response [phone]

## 2023-12-31 DIAGNOSIS — N1832 Chronic kidney disease, stage 3b: Secondary | ICD-10-CM | POA: Diagnosis not present

## 2023-12-31 DIAGNOSIS — I10 Essential (primary) hypertension: Secondary | ICD-10-CM | POA: Diagnosis not present

## 2023-12-31 DIAGNOSIS — I5033 Acute on chronic diastolic (congestive) heart failure: Secondary | ICD-10-CM | POA: Diagnosis not present

## 2023-12-31 DIAGNOSIS — I2510 Atherosclerotic heart disease of native coronary artery without angina pectoris: Secondary | ICD-10-CM | POA: Diagnosis not present

## 2023-12-31 LAB — MAGNESIUM: Magnesium: 2 mg/dL (ref 1.7–2.4)

## 2023-12-31 LAB — BASIC METABOLIC PANEL WITH GFR
Anion gap: 18 — ABNORMAL HIGH (ref 5–15)
BUN: 78 mg/dL — ABNORMAL HIGH (ref 6–20)
CO2: 22 mmol/L (ref 22–32)
Calcium: 9.3 mg/dL (ref 8.9–10.3)
Chloride: 94 mmol/L — ABNORMAL LOW (ref 98–111)
Creatinine, Ser: 3.4 mg/dL — ABNORMAL HIGH (ref 0.44–1.00)
GFR, Estimated: 18 mL/min — ABNORMAL LOW (ref >60)
Glucose, Bld: 85 mg/dL (ref 70–99)
Potassium: 4.1 mmol/L (ref 3.5–5.1)
Sodium: 134 mmol/L — ABNORMAL LOW (ref 135–145)

## 2023-12-31 LAB — GLUCOSE, CAPILLARY
Glucose-Capillary: 58 mg/dL — ABNORMAL LOW (ref 70–99)
Glucose-Capillary: 137 mg/dL — ABNORMAL HIGH (ref 70–99)
Glucose-Capillary: 234 mg/dL — ABNORMAL HIGH (ref 70–99)
Glucose-Capillary: 387 mg/dL — ABNORMAL HIGH (ref 70–99)
Glucose-Capillary: 311 mg/dL — ABNORMAL HIGH (ref 70–99)

## 2023-12-31 MED ORDER — INSULIN ASPART 100 UNIT/ML IJ SOLN
0.0000 [IU] | INTRAMUSCULAR | Status: DC
  Administered 2023-12-31: 9 [IU] via SUBCUTANEOUS
  Administered 2023-12-31: 1 [IU] via SUBCUTANEOUS
  Administered 2023-12-31: 3 [IU] via SUBCUTANEOUS
  Administered 2023-12-31: 7 [IU] via SUBCUTANEOUS
  Administered 2023-12-31: 9 [IU] via SUBCUTANEOUS
  Administered 2024-01-01: 7 [IU] via SUBCUTANEOUS
  Administered 2024-01-01: 5 [IU] via SUBCUTANEOUS
  Administered 2024-01-01: 9 [IU] via SUBCUTANEOUS
  Administered 2024-01-01: 2 [IU] via SUBCUTANEOUS
  Administered 2024-01-02: 1 [IU] via SUBCUTANEOUS
  Administered 2024-01-02 (×2): 5 [IU] via SUBCUTANEOUS
  Administered 2024-01-02: 2 [IU] via SUBCUTANEOUS

## 2023-12-31 MED ORDER — INSULIN ASPART 100 UNIT/ML IJ SOLN
10.0000 [IU] | Freq: Three times a day (TID) | INTRAMUSCULAR | Status: DC
  Administered 2023-12-31 – 2024-01-02 (×6): 10 [IU] via SUBCUTANEOUS

## 2023-12-31 MED ORDER — DEXTROSE 50 % IV SOLN
12.5000 g | INTRAVENOUS | Status: DC | PRN
  Administered 2023-12-31: 12.5 g via INTRAVENOUS

## 2023-12-31 MED ORDER — DEXTROSE 50 % IV SOLN
12.5000 g | INTRAVENOUS | Status: DC
Start: 2023-12-31 — End: 2023-12-31

## 2023-12-31 NOTE — Progress Notes (Signed)
 Progress Note   Patient: Jennifer [name] FMW:[id number] DOB: 08-13-1990 DOA: 12/28/2023     3 DOS: the patient was seen and examined on 12/31/2023   Brief hospital course: Jennifer [name] was admitted to the hospital with the working diagnosis of heart failure exacerbation.   33 yo female with the past medical history of hypertension, hyperlipidemia, T2Dm, chronic pancreatitis and heart failure who presented with abdominal distention and rapid weight gain.  Recent hospitalization for heat failure 09/11 to 12/14/23 for heart failure exacerbation.  Apparently at home she has not been compliant with her medications.  On the day of hospitalization she was evaluated at the outpatient cardiology clinic and found volume overloaded with uncontrolled hypertension and tachycardia. 7 lbs weight gain since she was discharged from the hospital in September.  She was referred to the hospital for further diuresis.  On her initial physical examination her blood pressure was 137/82, HR 133, RR 26 and 02 saturation 97% Lungs with no wheezing or rhonchi, heart with S1 and S2 present and tachycardic, abdomen distended but not tender, no lower extremity edema.    Na 135, K 4.7 Cl 104, glucose 152 bun 67 cr 2,10  High sensitive troponin 21 and 20  Wbc 10,4 hgb 11.4 plt 281   Chest radiograph with hypoinflation, positive cardiomegaly, bilateral hilar vascular congestion and small central bilateral interstitial infiltrates.   EKG 135 bpm, left axis deviation, normal intervals qtc 489, sinus rhythm with right atrial enlargement, no significant ST segment or T wave changes, positive LVH.   10/05 improved volume status, pending renal function to improve prior to discharge,    Assessment and Plan: * Acute on chronic diastolic CHF (congestive heart failure) (HCC) Echocardiogram with mildly reduced LV systolic function with EF 45 to 50%, mild LVH, grade II diastolic dysfunction (pseudo-normalization EA), RVSP 31.7  mmHg,    Documented urine output is 900 cc Systolic blood pressure 105 mmHg range.  Continue with ivabradine  for tachycardia.  Afterload reduction with hydralazine  and isosorbide . Hold on torsemide  and follow up renal function in am.    Essential hypertension Continue blood pressure monitoring   Coronary artery disease involving native coronary artery of native heart without angina pectoris No angina, elevation in high sensitive troponin due to volume overload.  No acute coronary syndrome.   Continue aspirin  and atorvastatin .   Stage 3b chronic kidney disease (CKD) (HCC) Hyponatremia, baseline cr 2,5 to 3.0   Renal function today with serum cr at 3,4 with K at 4,1 and serum bicarbonate at 22  Na 134   Plan to continue close follow up renal function and electrolytes.  If renal function stable tomorrow possible discharge home.    Chronic pancreatitis (HCC) No acute flare, no abdominal pain, nausea or vomiting.   History of astrocytoma of brain Follow up as outpatient.   Type 2 diabetes mellitus with hyperlipidemia (HCC) Resume home insulin  U500, will use 50% of her home dose, 150 units bid Patient had hypoglycemia, insulin  sliding scale was reduced in intensity.  Will resume pre meal insulin  10 units. She has a very difficult to control diabetes mellitus.   Patient with significant insulin  resistance.  Positive hyperglycemia.   Continue statin and fenofibrate .   Hypothyroidism Continue levothyroxine    Sleep apnea Cpap  Depression continue with duloxetine .         Subjective: Patient with no chest pain or dyspnea, no lower extremity edema, PND or  orthopnea   Physical Exam: Vitals:   12/31/23 0023 12/31/23 0434  12/31/23 0759 12/31/23 1114  BP: 104/60 126/66 (!) 105/51 106/61  Pulse: 85 85 95 91  Resp: 16 18 18 18   Temp: 98.1 F (36.7 C) 97.9 F (36.6 C) 98 F (36.7 C) 98 F (36.7 C)  TempSrc: Oral Oral Oral Oral  SpO2: 97% 98% 98% 98%  Weight:       Height:       Neurology awake and alert ENT with mild pallor with no icterus Cardiovascular wit S1 and S2 present and regular, with no gallops or rubs, positive systolic murmur at the left lower sternal border Respiratory with no rales or wheezing Abdomen with no distention  No lower extremity edema   Data Reviewed:    Family Communication: no family at the bedside   Disposition: Status is: Inpatient Remains inpatient appropriate because: recovering heart failure and renal failure   Planned Discharge Destination: Home     Author: Elidia Toribio Furnace, MD 12/31/2023 1:18 PM  For on call review [email or web address].

## 2023-12-31 NOTE — Progress Notes (Signed)
 Cardiologist: [name], MD  AHF: Dr. [name]  Chief Complaint: A/c Systolic Heart Failure Subjective:    Hypoglycemic event treated with D50  Scr 2.10>>1.66-> 2.6->3.4 K 5.0 -> 4.5 -> 4.1  Doing well today without any complaints and wants to go home.   Objective:   Weight Range: 65.5 kg Body mass index is 31.27 kg/m.   Vital Signs:   Temp:  [97.9 F (36.6 C)-98.6 F (37 C)] 98 F (36.7 C) (10/05 0759) Pulse Rate:  [85-95] 95 (10/05 0759) Resp:  [16-20] 18 (10/05 0759) BP: (101-126)/(51-66) 105/51 (10/05 0759) SpO2:  [94 %-99 %] 98 % (10/05 0759) Last BM Date : 12/31/23  Weight change: Filed Weights   12/28/23 1500 12/28/23 2150  Weight: 64 kg 65.5 kg    Intake/Output:   Intake/Output Summary (Last 24 hours) at 12/31/2023 1040 Last data filed at 12/31/2023 0900 Gross per 24 hour  Intake 1080 ml  Output 900 ml  Net 180 ml      Physical Exam   Physical Exam Vitals and nursing note reviewed.  Constitutional:      Appearance: Normal appearance.  HENT:     Head: Normocephalic and atraumatic.  Eyes:     Conjunctiva/sclera: Conjunctivae normal.  Neck:     Vascular: No JVD.  Cardiovascular:     Rate and Rhythm: Normal rate and regular rhythm.  Pulmonary:     Effort: Pulmonary effort is normal.     Breath sounds: Normal breath sounds.  Musculoskeletal:        General: No swelling or tenderness.  Skin:    Coloration: Skin is not jaundiced or pale.  Neurological:     Mental Status: [name] alert.      Telemetry   NSR, personally reviewed   EKG    N/A   Labs    CBC Recent Labs    12/28/23 1425 12/28/23 1609 12/29/23 0211  WBC 10.4  --  8.8  HGB 11.4* 11.9* 11.8*  HCT 34.3* 35.0* 31.2*  MCV 88.6  --  89.3  PLT 281  --  183   Basic Metabolic Panel Recent Labs    89/95/74 0234 12/31/23 0211  NA 131* 134*  K 4.5 4.1  CL 93* 94*  CO2 20* 22  GLUCOSE 354* 85  BUN 67* 78*  CREATININE 2.62* 3.40*  CALCIUM  9.2 9.3  MG 1.9  2.0   Liver Function Tests No results for input(s): AST, ALT, ALKPHOS, BILITOT, PROT, ALBUMIN  in the last 72 hours. No results for input(s): LIPASE, AMYLASE in the last 72 hours. Cardiac Enzymes No results for input(s): CKTOTAL, CKMB, CKMBINDEX, TROPONINI in the last 72 hours.  BNP: BNP (last 3 results) Recent Labs    11/10/23 1238 11/10/23 1627 12/28/23 1425  BNP 27.7 20.9 37.4    ProBNP (last 3 results) Recent Labs    12/07/23 0018  PROBNP 433.0*     D-Dimer No results for input(s): DDIMER in the last 72 hours. Hemoglobin A1C No results for input(s): HGBA1C in the last 72 hours. Fasting Lipid Panel No results for input(s): CHOL, HDL, LDLCALC, TRIG, CHOLHDL, LDLDIRECT in the last 72 hours. Thyroid  Function Tests No results for input(s): TSH, T4TOTAL, T3FREE, THYROIDAB in the last 72 hours.  Invalid input(s): FREET3  Other results:   Imaging    No results found.    Medications:     Scheduled Medications:  aspirin  EC  81 mg Oral Daily   atorvastatin   80 mg Oral QPM  calcitRIOL   0.25 mcg Oral Daily   DULoxetine   60 mg Oral QHS   fenofibrate   54 mg Oral Daily   heparin   5,000 Units Subcutaneous Q8H   hydrALAZINE   100 mg Oral Q8H   insulin  aspart  0-9 Units Subcutaneous Q4H   isosorbide  mononitrate  60 mg Oral Daily   ivabradine   7.5 mg Oral BID WC   levothyroxine   150 mcg Oral Q0600   pantoprazole   40 mg Oral BID AC   pregabalin   300 mg Oral QHS   sodium chloride  flush  3 mL Intravenous Q12H   torsemide   60 mg Oral Daily    Infusions:   PRN Medications: acetaminophen  **OR** acetaminophen , dextrose , prochlorperazine , senna    Patient Profile   33 y.o. female with history of HFrEF/NICM, CAD, CKD IV, type 3c DM, severe hypertrigyceridemia 2/2 LPL deficiency, OSA, uncontrolled HTN.    Admitted with acute on chronic HFmrEF and uncontrolled HTN in setting of nonadherence.  Assessment/Plan    HFrEF now with HFmrEF/nonischemic cardiomyopathy, euvolemic - prev reduced EF to 29%. Now stable EF 45-55% since 2022. With fluctuant RV function. - most recent echo 9/25: EF 45-50%, RV mildly reduced - RHC 9/25 RA 6, PA 46/23 (mean 31), PCWP 13, FICK CI 1.8, TPG 18, PVR 6.4 WU, PAPi 3.8, severely elevated SVR 2857 - Hydralazine  titrated post RHC for further afterload reduction but pt has been noncompliant w/ meds/diuretics since d/c, per paramedicine report. Also noncompliant w/ sending daily cardioMEMs transmissions.  - Now readmitted w/ NYHA Class III symptoms, marked volume overload in setting of med noncompliance - Hold torsemide  60 mg due to AKI (already received this morning, will discontinue for now) - Continue hydralazine  100 mg TID for afterload reduction + Imdur  60 mg  - On home corlanor  7.5 mg BID. Not on ? blocker given recent low CI on RHC  - GDMT limited by CKD   2. CAD - Single vessel RCA occlusion (small co-dominant vessel) on cardiac cath 09/08. - she reports mild chest pain in setting of marked hypertension and volume overload. HS troponin just mildly elevated at 21>>20  - Continue medical management.  - Continue ASA 81 + Atorva 80 mg  - Previously on repatha  but insurance stopped covering. Has upcoming appointment with lipid clinic.   3.  AKI on CKD IV - Previously followed by Dr. Kimble (Nephrology at [facility]), now followed by [facility] (Dr. Louella) - Had renal biopsy (10/23) and showed DM and HTN nephropathy. - renal function worse today - Consider nephrology consult - follow BMP    4. Pulmonary hypertension - RHC RHC 9/25 RA 6, PA 46/23 (mean 31), PCWP 13, FICK CI 1.8, TPG 18, PVR 6.4 WU, PAPi 3.8, severely elevated SVR 2857 - Echo 9/25, RV mildly reduced  - V/Q negative - Off sildenafil  d/t dizziness and low BP   5. Uncontrolled HTN - In setting of noncompliance with home meds (see paramedicine encounters).  - Improved w/ restart of meds    7. OSA - per  [facility] - has CPAP but compliance poor    8. Severe hyperTG - Due to LPL deficiency w/ hx pancreatitis. - Follows in [facility] (Dr. Mona)  - fenofibrate  discontinued sometime during last admission. Rationale not clear. TG > 4,000 last admission. Now on renally dosed fenofibrate  at 54 mg daily and increase home atorvastatin  to 80 (held off on rosuvastatin  d/t kidney function)   9. Type 3c DM - 2/2 chronic pancreatitis  - Follows with  [facility] - On insulin    10. Chronic abdominal swelling/bloating - Seen at [facility] (11/20) for IBS, per chart review. - Had penumatosis intestinalis. She was started on pancreatic enzyme replacement. - Has DM gastroparesis - Suspect primarily fatty liver   11. Poor Med Compliance/SDOH - recently re-enrolled in Paramedicine program but c/w poor compliance   Length of Stay: 3  Jennifer FORBES Calender, MD  12/31/2023, 10:40 AM   Jennifer [name] 12/31/2023 10:40 AM

## 2023-12-31 NOTE — Progress Notes (Signed)
 Hypoglycemic Event  CBG: 58  Treatment: D50 25 mL (12.5 gm)  Symptoms: None  Follow-up CBG: Time:. CBG Result:.  Possible Reasons for Event: Unknown  Comments/MD notified:Dr. Alfornia Motto  [facility]

## 2023-12-31 NOTE — Plan of Care (Signed)

## 2024-01-01 ENCOUNTER — Inpatient Hospital Stay (HOSPITAL_COMMUNITY)

## 2024-01-01 DIAGNOSIS — N1832 Chronic kidney disease, stage 3b: Secondary | ICD-10-CM | POA: Diagnosis not present

## 2024-01-01 DIAGNOSIS — I5033 Acute on chronic diastolic (congestive) heart failure: Secondary | ICD-10-CM | POA: Diagnosis not present

## 2024-01-01 DIAGNOSIS — I10 Essential (primary) hypertension: Secondary | ICD-10-CM | POA: Diagnosis not present

## 2024-01-01 DIAGNOSIS — I2510 Atherosclerotic heart disease of native coronary artery without angina pectoris: Secondary | ICD-10-CM | POA: Diagnosis not present

## 2024-01-01 LAB — BASIC METABOLIC PANEL WITH GFR
Anion gap: 18 — ABNORMAL HIGH (ref 5–15)
BUN: 81 mg/dL — ABNORMAL HIGH (ref 6–20)
CO2: 19 mmol/L — ABNORMAL LOW (ref 22–32)
Calcium: 8.8 mg/dL — ABNORMAL LOW (ref 8.9–10.3)
Chloride: 93 mmol/L — ABNORMAL LOW (ref 98–111)
Creatinine, Ser: 3.73 mg/dL — ABNORMAL HIGH (ref 0.44–1.00)
GFR, Estimated: 16 mL/min — ABNORMAL LOW (ref >60)
Glucose, Bld: 355 mg/dL — ABNORMAL HIGH (ref 70–99)
Potassium: 5 mmol/L (ref 3.5–5.1)
Sodium: 130 mmol/L — ABNORMAL LOW (ref 135–145)

## 2024-01-01 LAB — GLUCOSE, CAPILLARY
Glucose-Capillary: 171 mg/dL — ABNORMAL HIGH (ref 70–99)
Glucose-Capillary: 253 mg/dL — ABNORMAL HIGH (ref 70–99)
Glucose-Capillary: 315 mg/dL — ABNORMAL HIGH (ref 70–99)
Glucose-Capillary: 376 mg/dL — ABNORMAL HIGH (ref 70–99)
Glucose-Capillary: 390 mg/dL — ABNORMAL HIGH (ref 70–99)

## 2024-01-01 MED ORDER — INSULIN REGULAR HUMAN (CONC) 500 UNIT/ML ~~LOC~~ SOLN
100.0000 [IU] | Freq: Two times a day (BID) | SUBCUTANEOUS | Status: DC
  Administered 2024-01-01 – 2024-01-02 (×3): 100 [IU] via SUBCUTANEOUS
  Filled 2024-01-01: qty 20

## 2024-01-01 NOTE — Progress Notes (Signed)
 Progress Note  Patient Name: Jennifer [name] Date of Encounter: 01/01/2024 [facility] Cardiologist: [name], MD   Interval Summary    No complaints this morning, says she needs more insulin . Reports no issues with getting her home meds, but was not taking them.   Vital Signs Vitals:   12/31/23 2005 12/31/23 2006 01/01/24 0400 01/01/24 0754  BP: 124/63   (!) 165/78  Pulse:    (!) 106  Resp:  20  19  Temp:  97.8 F (36.6 C)  97.8 F (36.6 C)  TempSrc:  Oral  Oral  SpO2:  96%  92%  Weight:   64.4 kg   Height:        Intake/Output Summary (Last 24 hours) at 01/01/2024 0853 Last data filed at 01/01/2024 0400 Gross per 24 hour  Intake 1192 ml  Output 2300 ml  Net -1108 ml      01/01/2024    4:00 AM 12/28/2023    9:50 PM 12/28/2023    3:00 PM  Last 3 Weights  Weight (lbs) 141 lb 14.4 oz 144 lb 8 oz 141 lb  Weight (kg) 64.365 kg 65.545 kg 63.957 kg      Telemetry/ECG   Sinus Rhythm, tachycardic at times - Personally Reviewed  Physical Exam  GEN: No acute distress.   Neck: No JVD Cardiac: RRR, no murmurs, rubs, or gallops.  Respiratory: Clear to auscultation bilaterally. GI: Soft, nontender, non-distended  MS: No edema  Assessment & Plan   33 y.o. female with history of HFrEF/NICM, CAD, CKD IV, type 3c DM, severe hypertrigyceridemia 2/2 LPL deficiency, OSA, uncontrolled HTN. Admitted with acute on chronic HFmrEF and uncontrolled HTN in setting of nonadherence.  HFrEF now with HFmrEF/nonischemic cardiomyopathy -- prev reduced EF to 29%. Now stable EF 45-55% since 2022. With fluctuant RV function. -- echo 9/25: EF 45-50%, RV mildly reduced -- RHC 9/25 RA 6, PA 46/23 (mean 31), PCWP 13, FICK CI 1.8, TPG 18, PVR 6.4 WU, PAPi 3.8, severely elevated SVR 2857 -- Hydralazine  titrated post RHC for further afterload reduction but pt has been noncompliant w/ meds/diuretics since d/c, per paramedicine report. Also noncompliant w/ sending daily cardioMEMs  transmissions.  -- was seen in the AHF clinic and readmitted w/ NYHA Class III symptoms, marked volume overload in setting of med noncompliance -- initially diuresed, but torsemide  60 mg now held due to AKI -- net - 3.4L, 64kg -- Continue hydralazine  100 mg TID for afterload reduction + Imdur  60 mg, corlanor  7.5 mg BID. Not on ? blocker given recent low CI on RHC  -- GDMT limited by CKD   CAD -- Single vessel RCA occlusion (small co-dominant vessel) on cardiac cath 09/08. -- HS troponin just mildly elevated at 21>>20, though in the setting of volume overloaded  -- Continue ASA 81 + Atorva 80 mg. Previously on repatha  but insurance stopped covering. Has upcoming appointment with lipid clinic.   AKI on CKD IV -- Previously followed by Dr. Kimble (Nephrology at [facility]), now followed by [facility] (Dr. Louella) -- renal biopsy (10/23) and showed DM and HTN nephropathy. -- unfortunately her Cr continues to rise up to 3.73 today, would consider renal consult    Pulmonary hypertension -- RHC RHC 9/25 RA 6, PA 46/23 (mean 31), PCWP 13, FICK CI 1.8, TPG 18, PVR 6.4 WU, PAPi 3.8, severely elevated SVR 2857 -- Echo 9/25, RV mildly reduced  -- V/Q negative -- Off sildenafil  d/t dizziness and low BP   Uncontrolled HTN --  In setting of noncompliance with home meds (see paramedicine encounters).  -- overall improved with restart of meds, continue hydralazine  and Imdur     OSA -- follows with [facility] -- has CPAP but compliance poor    Severe hyperTG -- Due to LPL deficiency w/ hx pancreatitis. Follows in Lipid Clinic (Dr. Mona)  -- fenofibrate  discontinued sometime during a previous admission Rationale not clear. TG > 4,000 last admission.  -- Now on renally dosed fenofibrate  at 54 mg daily and increase home atorvastatin  to 80 (held off on rosuvastatin  d/t kidney function)   DM -- 2/2 chronic pancreatitis  --  Follows with [facility] -- on PTA insulin , did have a hypoglycemic event  overnight, management per primary    Chronic abdominal swelling/bloating -- seen at [facility] (11/20) for IBS, per chart review and had penumatosis intestinalis. She was started on pancreatic enzyme replacement. -- Has DM gastroparesis   Poor Med Compliance/SDOH -- recently re-enrolled in Paramedicine program but c/w poor compliance For questions or updates, please contact  [facility] Please consult [email or web address] for contact info under       Signed, Manuelita Rummer, NP

## 2024-01-01 NOTE — Progress Notes (Signed)
 Progress Note   Patient: Jennifer [name] FMW:[id number] DOB: 1991-03-10 DOA: 12/28/2023     4 DOS: the patient was seen and examined on 01/01/2024   Brief hospital course: Jennifer [name] was admitted to the hospital with the working diagnosis of heart failure exacerbation.   33 yo female with the past medical history of hypertension, hyperlipidemia, T2Dm, chronic pancreatitis and heart failure who presented with abdominal distention and rapid weight gain.  Recent hospitalization for heat failure 09/11 to 12/14/23 for heart failure exacerbation.  Apparently at home she has not been compliant with her medications.  On the day of hospitalization she was evaluated at the outpatient cardiology clinic and found volume overloaded with uncontrolled hypertension and tachycardia. 7 lbs weight gain since she was discharged from the hospital in September.  She was referred to the hospital for further diuresis.  On her initial physical examination her blood pressure was 137/82, HR 133, RR 26 and 02 saturation 97% Lungs with no wheezing or rhonchi, heart with S1 and S2 present and tachycardic, abdomen distended but not tender, no lower extremity edema.    Na 135, K 4.7 Cl 104, glucose 152 bun 67 cr 2,10  High sensitive troponin 21 and 20  Wbc 10,4 hgb 11.4 plt 281   Chest radiograph with hypoinflation, positive cardiomegaly, bilateral hilar vascular congestion and small central bilateral interstitial infiltrates.   EKG 135 bpm, left axis deviation, normal intervals qtc 489, sinus rhythm with right atrial enlargement, no significant ST segment or T wave changes, positive LVH.   10/05 improved volume status, pending renal function to improve prior to discharge,  10/06 worsening renal function, checking for ascites.   Assessment and Plan: * Acute on chronic diastolic CHF (congestive heart failure) (HCC) Echocardiogram with mildly reduced LV systolic function with EF 45 to 50%, mild LVH, grade II  diastolic dysfunction (pseudo-normalization EA), RVSP 31.7 mmHg,    Documented urine output is 2,300 cc Systolic blood pressure 130 mmHg range.  Continue with ivabradine  for tachycardia.  Afterload reduction with hydralazine  and isosorbide . Continue to hold on loop diuretic. Check abdominal ultrasound for possible ascites.   Essential hypertension Continue blood pressure monitoring   Coronary artery disease involving native coronary artery of native heart without angina pectoris No angina, elevation in high sensitive troponin due to volume overload.  No acute coronary syndrome.   Continue aspirin  and atorvastatin .   Stage 3b chronic kidney disease (CKD) (HCC) Hyponatremia, baseline cr 2,5 to 3.0   Today renal function with serum cr at 3,73 with K at 5.0 and serum bicarbonate at 19  Na 130   Plan to continue close follow up renal function and electrolytes.  Hold on furosemide  for now   Chronic pancreatitis (HCC) No acute flare, no abdominal pain, nausea or vomiting.   History of astrocytoma of brain Follow up as outpatient.   Type 2 diabetes mellitus with hyperlipidemia (HCC) Resume home insulin  U500 with lower dose at 100 units bid Patient had hypoglycemia, insulin  sliding scale was reduced in intensity.  Continue with pre meal insulin  10 units. She has a very difficult to control diabetes mellitus.   Patient with significant insulin  resistance.  Positive hyperglycemia.   Continue statin and fenofibrate .   Hypothyroidism Continue levothyroxine    Sleep apnea Cpap  Depression continue with duloxetine .         Subjective: Patient with no chest pain or dyspnea, feeling her abdomen tight.   Physical Exam: Vitals:   01/01/24 0400 01/01/24 0754 01/01/24 1020 01/01/24  1630  BP:  (!) 165/78 (!) 153/61 130/70  Pulse:  (!) 106  96  Resp:  19 20   Temp:  97.8 F (36.6 C) 97.8 F (36.6 C)   TempSrc:  Oral Oral Oral  SpO2:  92% 95% 96%  Weight: 64.4 kg      Height:       Neurology awake and alert ENT with mild pallor with no icterus Cardiovascular with S1 and S2 present and regular with no gallops, rubs or murmurs Respiratory with no rales or wheezing, no rhonchi  Abdomen with mild distention, soft and non tender No lower extremity edema   Data Reviewed:    Family Communication: no family at the bedside   Disposition: Status is: Inpatient Remains inpatient appropriate because: AKI  Planned Discharge Destination: Home     Author: Elidia Toribio Furnace, MD 01/01/2024 5:04 PM  For on call review [email or web address].

## 2024-01-01 NOTE — Plan of Care (Signed)
  Problem: Health Behavior/Discharge Planning: Goal: Ability to manage health-related needs will improve Outcome: Progressing   Problem: Nutrition: Goal: Adequate nutrition will be maintained Outcome: Progressing   Problem: Coping: Goal: Level of anxiety will decrease Outcome: Progressing   Problem: Elimination: Goal: Will not experience complications related to bowel motility Outcome: Progressing Goal: Will not experience complications related to urinary retention Outcome: Progressing   Problem: Skin Integrity: Goal: Risk for impaired skin integrity will decrease Outcome: Progressing

## 2024-01-02 ENCOUNTER — Telehealth (HOSPITAL_COMMUNITY): Payer: Self-pay | Admitting: Pharmacy Technician

## 2024-01-02 ENCOUNTER — Other Ambulatory Visit (HOSPITAL_COMMUNITY): Payer: Self-pay

## 2024-01-02 DIAGNOSIS — I10 Essential (primary) hypertension: Secondary | ICD-10-CM | POA: Diagnosis not present

## 2024-01-02 DIAGNOSIS — L089 Local infection of the skin and subcutaneous tissue, unspecified: Secondary | ICD-10-CM

## 2024-01-02 DIAGNOSIS — I5033 Acute on chronic diastolic (congestive) heart failure: Secondary | ICD-10-CM | POA: Diagnosis not present

## 2024-01-02 DIAGNOSIS — I2510 Atherosclerotic heart disease of native coronary artery without angina pectoris: Secondary | ICD-10-CM | POA: Diagnosis not present

## 2024-01-02 DIAGNOSIS — N1832 Chronic kidney disease, stage 3b: Secondary | ICD-10-CM | POA: Diagnosis not present

## 2024-01-02 LAB — GLUCOSE, CAPILLARY
Glucose-Capillary: 109 mg/dL — ABNORMAL HIGH (ref 70–99)
Glucose-Capillary: 131 mg/dL — ABNORMAL HIGH (ref 70–99)
Glucose-Capillary: 176 mg/dL — ABNORMAL HIGH (ref 70–99)
Glucose-Capillary: 281 mg/dL — ABNORMAL HIGH (ref 70–99)
Glucose-Capillary: 281 mg/dL — ABNORMAL HIGH (ref 70–99)

## 2024-01-02 LAB — BASIC METABOLIC PANEL WITH GFR
Anion gap: 19 — ABNORMAL HIGH (ref 5–15)
BUN: 88 mg/dL — ABNORMAL HIGH (ref 6–20)
CO2: 21 mmol/L — ABNORMAL LOW (ref 22–32)
Calcium: 9.8 mg/dL (ref 8.9–10.3)
Chloride: 96 mmol/L — ABNORMAL LOW (ref 98–111)
Creatinine, Ser: 3.2 mg/dL — ABNORMAL HIGH (ref 0.44–1.00)
GFR, Estimated: 19 mL/min — ABNORMAL LOW (ref >60)
Glucose, Bld: 111 mg/dL — ABNORMAL HIGH (ref 70–99)
Potassium: 4.5 mmol/L (ref 3.5–5.1)
Sodium: 136 mmol/L (ref 135–145)

## 2024-01-02 LAB — MAGNESIUM: Magnesium: 2.5 mg/dL — ABNORMAL HIGH (ref 1.7–2.4)

## 2024-01-02 MED ORDER — ISOSORBIDE MONONITRATE ER 60 MG PO TB24
60.0000 mg | ORAL_TABLET | Freq: Every day | ORAL | 0 refills | Status: DC
  Filled 2024-01-02: qty 30, 30d supply, fill #0

## 2024-01-02 MED ORDER — CEPHALEXIN 500 MG PO CAPS
500.0000 mg | ORAL_CAPSULE | Freq: Two times a day (BID) | ORAL | 0 refills | Status: AC
  Filled 2024-01-02: qty 10, 5d supply, fill #0

## 2024-01-02 MED ORDER — HYDRALAZINE HCL 100 MG PO TABS
100.0000 mg | ORAL_TABLET | Freq: Three times a day (TID) | ORAL | 0 refills | Status: DC
  Filled 2024-01-02: qty 90, 30d supply, fill #0

## 2024-01-02 MED ORDER — CEPHALEXIN 500 MG PO CAPS
500.0000 mg | ORAL_CAPSULE | Freq: Two times a day (BID) | ORAL | Status: DC
  Filled 2024-01-02: qty 1

## 2024-01-02 MED ORDER — IVABRADINE HCL 7.5 MG PO TABS
7.5000 mg | ORAL_TABLET | Freq: Two times a day (BID) | ORAL | 0 refills | Status: DC
  Filled 2024-01-02: qty 60, 30d supply, fill #0

## 2024-01-02 MED ORDER — CEPHALEXIN 250 MG PO CAPS
250.0000 mg | ORAL_CAPSULE | Freq: Three times a day (TID) | ORAL | Status: DC
  Filled 2024-01-02 (×2): qty 1

## 2024-01-02 MED ORDER — FENOFIBRATE 54 MG PO TABS
54.0000 mg | ORAL_TABLET | Freq: Every day | ORAL | 0 refills | Status: DC
  Filled 2024-01-02: qty 30, 30d supply, fill #0

## 2024-01-02 MED ORDER — ATORVASTATIN CALCIUM 80 MG PO TABS
80.0000 mg | ORAL_TABLET | Freq: Every evening | ORAL | 0 refills | Status: DC
  Filled 2024-01-02: qty 30, 30d supply, fill #0

## 2024-01-02 NOTE — Progress Notes (Signed)
 33 year old female with hypertension, severe hypertriglyceridemia secondary to LPL deficiency leading to uncontrolled diabetes, CAD, nonischemic cardiomyopathy,  pulmonary hypertension, HFrEF, CKD stage IV   HFmrEF: Admitted with acute on chronic decompensation. No JVD or edema on exam, but has abdominal distention. No ascites on abdominal US . Cr improving. Okay to discharge home on torsemide  20 mg daily.    Hypertriglyceridemia/mixed hyperlipidemia: Continue Lipitor 80 mg daily, fenofibrate  54 mg daily for now. Outpatient recommendations as per lipid clinic with Dr. Mona.   CAD: No anginal symptoms at this time   Pulmonary hypertension: RHC RHC 9/25 RA 6, PA 46/23 (mean 31), PCWP 13, FICK CI 1.8, TPG 18, PVR 6.4 WU, PAPi 3.8, severely elevated SVR 2857 Did not tolerate sildenafil  in the past.  Needs f/u w/heart failure clinic in 1-2 weeks.  Newman JINNY Lawrence, MD

## 2024-01-02 NOTE — Telephone Encounter (Signed)
 Pharmacy Patient Advocate Encounter   Received notification from Inpatient Request that prior authorization for Ivabradine  HCl 7.5MG  tablets  is required/requested.   Insurance verification completed.   The patient is insured through [employer].   Per test claim: PA required; PA submitted to above mentioned insurance via Latent Key/confirmation #/[id number] Status is pending

## 2024-01-02 NOTE — Discharge Summary (Signed)
 Physician Discharge Summary   Patient: Jennifer [name] MRN: [id number] DOB: Jun 26, 1990  Admit date:     12/28/2023  Discharge date: 01/02/24  Discharge Physician: Jennifer [name]   PCP: Jennifer Jennifer [name], [name]   Recommendations at discharge:    Patient will continue medical therapy with hydralazine , isosorbide  and ivabradine .  Diuresis with torsemide  60 mg po daily Added cephalexin for skin infection, right thigh skin infection, if worsening will need outpatient follow up.  Follow up renal function and electrolytes in 7 days as outpatient Follow up with Jennifer [name] in 7 to 10 days Follow up with Cardiology as scheduled.   Discharge Diagnoses: Principal Problem:   Acute on chronic diastolic CHF (congestive heart failure) (HCC) Active Problems:   Essential hypertension   Coronary artery disease involving native coronary artery of native heart without angina pectoris   Stage 3b chronic kidney disease (CKD) (HCC)   Chronic pancreatitis (HCC)   History of astrocytoma of brain   Type 2 diabetes mellitus with hyperlipidemia (HCC)   Hypothyroidism   Sleep apnea   Abscess of right thigh  Resolved Problems:   * No resolved hospital problems. [facility] Course: Jennifer [name] was admitted to the hospital with the working diagnosis of heart failure exacerbation.   33 yo female with the past medical history of hypertension, hyperlipidemia, T2Dm, chronic pancreatitis and heart failure who presented with abdominal distention and rapid weight gain.  Recent hospitalization for heat failure 09/11 to 12/14/23 for heart failure exacerbation.  Apparently at home she has not been compliant with her medications.  On the day of hospitalization she was evaluated at the outpatient cardiology clinic and found volume overloaded with uncontrolled hypertension and tachycardia. 7 lbs weight gain since she was discharged from the hospital in September.  She was referred to the hospital for  further diuresis.  On her initial physical examination her blood pressure was 137/82, HR 133, RR 26 and 02 saturation 97% Lungs with no wheezing or rhonchi, heart with S1 and S2 present and tachycardic, abdomen distended but not tender, no lower extremity edema.    Na 135, K 4.7 Cl 104, glucose 152 bun 67 cr 2,10  High sensitive troponin 21 and 20  Wbc 10,4 hgb 11.4 plt 281   Chest radiograph with hypoinflation, positive cardiomegaly, bilateral hilar vascular congestion and small central bilateral interstitial infiltrates.   EKG 135 bpm, left axis deviation, normal intervals qtc 489, sinus rhythm with right atrial enlargement, no significant ST segment or T wave changes, positive LVH.   10/05 improved volume status, pending renal function to improve prior to discharge,  10/06 worsening renal function, checking for ascites.   Assessment and Plan: * Acute on chronic diastolic CHF (congestive heart failure) (HCC) Echocardiogram with mildly reduced LV systolic function with EF 45 to 50%, mild LVH, grade II diastolic dysfunction (pseudo-normalization EA), RVSP 31.7 mmHg,    Patient was placed on IV furosemide  for diuresis, negative fluid balance was achieved, -5.011 ml, with significant improvement in her symptoms.   Continue with ivabradine  for tachycardia.  Afterload reduction with hydralazine  and isosorbide . Resume loop diuretic at the time of discharge.  Patient has been advised to be adherent to her medications.   Essential hypertension Continue blood pressure monitoring   Coronary artery disease involving native coronary artery of native heart without angina pectoris No angina, elevation in high sensitive troponin due to volume overload.  No acute coronary syndrome.   Continue aspirin  and atorvastatin .   Stage  3b chronic kidney disease (CKD) (HCC) AKI, Hyponatremia, baseline cr 2,5 to 3.0   Volume status has improved, serum cr is trending down to 3,20 with K at 4,5 and serum  bicarbonate at 21 Na 136 and Mg 2.5   Plan to continue loop diuretic at home Follow up renal function and electrolytes as outpatient.  Hold on furosemide  for now   Chronic pancreatitis (HCC) No acute flare, no abdominal pain, nausea or vomiting.   History of astrocytoma of brain Follow up as outpatient.   Type 2 diabetes mellitus with hyperlipidemia (HCC) Patient was placed on her insulin  U500  Dose had to be adjusted for hypoglycemia, during her hospitalization  At the time of discharge will resume her home dose and will follow up with [facility] as outpatient.   Patient with significant insulin  resistance.  Positive hyperglycemia.   Continue statin and fenofibrate .   Hypothyroidism Continue levothyroxine    Sleep apnea Cpap  Depression continue with duloxetine .   Skin infection Right thigh induration, no frank abscess per general surgery evaluation.  Patient will be placed on oral antibiotic therapy with cephalexin and instructions to follow up as outpatient.           Consultants: cardiology  Procedures performed: none   Disposition: Home Diet recommendation:  Discharge Diet Orders (From admission, onward)     Start     Ordered   01/02/24 0000  Diet - low sodium heart healthy        01/02/24 1228           Cardiac diet DISCHARGE MEDICATION: Allergies as of 01/02/2024       Reactions   Icosapent  Ethyl (epa Ethyl Ester) (fish) Hives   Ketamine Other (See Comments)   In a vegetative state for 15 minutes, per pt   Maitake Itching, Other (See Comments)   Itchy throat   Morphine  Hives, Itching, Rash, Other (See Comments)   Sore throat, too   Mushroom Other (See Comments)   Pt has never had mushrooms but tested positive on allergy test   Penicillins Hives, Itching, Rash   Tolerates cephalosporins   Shellfish Allergy Other (See Comments)   Pt has never had shellfish, but tested positive on allergy test. Pt states contrast in CT is okay.   Fentanyl   Nausea And Vomiting   Prednisone Rash        Medication List     STOP taking these medications    rosuvastatin  40 MG tablet Commonly known as: CRESTOR    sodium bicarbonate  650 MG tablet       TAKE these medications    albuterol  108 (90 Base) MCG/ACT inhaler Commonly known as: VENTOLIN  HFA Inhale 2 puffs into the lungs daily as needed for wheezing or shortness of breath.   aspirin  EC 81 MG tablet Take 1 tablet (81 mg total) by mouth daily. Swallow whole.   atorvastatin  80 MG tablet Commonly known as: LIPITOR Take 1 tablet (80 mg total) by mouth every evening. What changed:  medication strength how much to take when to take this   azelastine  0.1 % nasal spray Commonly known as: ASTELIN  Place 2 sprays into both nostrils daily as needed for rhinitis or allergies.   Baqsimi One Pack 3 MG/DOSE Powd Generic drug: Glucagon Place 1 Dose into the nose once as needed (hypoglycemia).   BLOOD GLUCOSE TEST STRIPS Strp 1 each by Does not apply route 3 (three) times daily. Use as directed to check blood sugar. May dispense any manufacturer covered by  patient's insurance and fits patient's device.   calcitRIOL  0.25 MCG capsule Commonly known as: ROCALTROL  Take 0.25 mcg by mouth daily.   cephALEXin 500 MG capsule Commonly known as: KEFLEX Take 1 capsule (500 mg total) by mouth every 12 (twelve) hours for 5 days.   dicyclomine  20 MG tablet Commonly known as: BENTYL  Take 1 tablet (20 mg total) by mouth 2 (two) times daily.   DULoxetine  60 MG capsule Commonly known as: CYMBALTA  Take 60 mg by mouth at bedtime.   ergocalciferol  1.25 MG (50000 UT) capsule Commonly known as: VITAMIN D2 Take 1 capsule (50,000 Units total) by mouth every Sunday.   fenofibrate  54 MG tablet Take 1 tablet (54 mg total) by mouth daily. Start taking on: January 03, 2024   HumaLOG  KwikPen 100 UNIT/ML KwikPen Generic drug: insulin  lispro Inject 60 Units into the skin in the morning, at noon, and  at bedtime.   HumuLIN  R U-500 KwikPen 500 UNIT/ML KwikPen Generic drug: insulin  regular human CONCENTRATED Inject 300 Units into the skin in the morning and at bedtime.   hydrALAZINE  100 MG tablet Commonly known as: APRESOLINE  Take 1 tablet (100 mg total) by mouth every 8 (eight) hours. What changed:  medication strength how much to take when to take this   isosorbide  mononitrate 60 MG 24 hr tablet Commonly known as: IMDUR  Take 1 tablet (60 mg total) by mouth daily. Start taking on: January 03, 2024 What changed:  medication strength how much to take   ivabradine  7.5 MG Tabs tablet Commonly known as: CORLANOR  Take 1 tablet (7.5 mg total) by mouth 2 (two) times daily with a meal.   Lancet Device Misc 1 each by Does not apply route 3 (three) times daily. May dispense any manufacturer covered by patient's insurance.   Lancets Misc 1 each by Does not apply route 3 (three) times daily. Use as directed to check blood sugar. May dispense any manufacturer covered by patient's insurance and fits patient's device.   lansoprazole  30 MG capsule Commonly known as: PREVACID  Take 1 capsule (30 mg total) by mouth 2 (two) times daily before a meal.   levocetirizine 5 MG tablet Commonly known as: XYZAL  Take 5 mg by mouth at bedtime.   levothyroxine  150 MCG tablet Commonly known as: SYNTHROID  Take 150 mcg by mouth daily before breakfast.   Pen Needles 31G X 5 MM Misc Use as directed 3 (three) times daily.   pregabalin  300 MG capsule Commonly known as: LYRICA  Take 300 mg by mouth at bedtime.   prochlorperazine  10 MG tablet Commonly known as: COMPAZINE  Take 10 mg by mouth daily as needed for nausea or vomiting.   promethazine  25 MG tablet Commonly known as: PHENERGAN  Take 25 mg by mouth at bedtime.   Safety Seal Miscellaneous Misc Hormonic Hair Solution with minoxidil USP 7% and finasteride USP 0.05% - apply to affected areas daily every morning.   SUMAtriptan  100 MG  tablet Commonly known as: IMITREX  Take 100 mg by mouth daily as needed.   torsemide  20 MG tablet Commonly known as: DEMADEX  Take 3 tablets (60 mg total) by mouth daily. What changed: when to take this   tretinoin  0.05 % cream Commonly known as: RETIN-A  Apply 1 Application topically at bedtime.        Follow-up Information     Jennifer Jennifer [name], [name] Follow up.   Specialty: Physician Assistant Why: Please follow up in a week. Contact information: [address] [place] [address] [place] [address] [phone]  Discharge Exam: Filed Weights   12/28/23 2150 01/01/24 0400 01/02/24 0400  Weight: 65.5 kg 64.4 kg 64.4 kg   BP (!) 145/79 (BP Location: Right Arm)   Pulse 100   Temp 98.5 F (36.9 C) (Oral)   Resp 19   Ht 4' 9 (1.448 m)   Wt 64.4 kg   SpO2 96%   BMI 30.72 kg/m   Patient is feeling better, no chest pain, no dyspnea, no orthopnea or [facility]   Neurology awake and alert ENT with mild pallor with no icterus Cardiovascular with S1 and S2 present and regular with no gallops rubs or murmurs Respiratory with no rales or wheezing, no rhonchi  Abdomen with no distention  No lower extremity edema  Right thigh internal region proximal, fluctuant lesion about 5 cm long to 2 cm wide, mild erythema and tender to touch. Possible local skin abscess.   Condition at discharge: stable  The results of significant diagnostics from this hospitalization (including imaging, microbiology, ancillary and laboratory) are listed below for reference.   Imaging Studies: US  ASCITES (ABDOMEN LIMITED) Result Date: 01/01/2024 CLINICAL DATA:  356290 Ascites [id number] EXAM: LIMITED ABDOMEN ULTRASOUND FOR ASCITES TECHNIQUE: Limited ultrasound survey for ascites was performed in all four abdominal quadrants and the midline pelvis. COMPARISON:  12/10/2023 FINDINGS: No ascites.  Hepatic steatosis. IMPRESSION: 1. No ascites. 2. Hepatic steatosis. Electronically Signed   By: Rogelia Myers M.D.   On: 01/01/2024 19:28   DG Chest 2 View Result Date: 12/28/2023 CLINICAL DATA:  Chest pain, tachycardia and hypertension. EXAM: CHEST - 2 VIEW COMPARISON:  12/07/2023 FINDINGS: The heart size and mediastinal contours are within normal limits. Stable appearance of CardioMEMS device in a left posterior lower lobe pulmonary artery branch. Stable mild bibasilar atelectasis. There is no evidence of pulmonary edema, consolidation, pneumothorax, nodule or pleural fluid. The visualized skeletal structures are unremarkable. IMPRESSION: Stable mild bibasilar atelectasis. No acute findings. Electronically Signed   By: Marcey Moan M.D.   On: 12/28/2023 15:17   CARDIAC CATHETERIZATION Result Date: 12/13/2023 HEMODYNAMICS: RA:       6 mmHg (mean) RV:       46/, 6 mmHg PA:       46/23 mmHg (31 mean) PCWP: 13 mmHg (mean)    Estimated Fick CO/CI   2.8L/min, 1.8/min/m2    TPG  18  mmHg     PVR  6.4 Wood Units PAPi  3.8  IMPRESSION: Right heart catheterization for evaluation of volume status and calibration of cardiomems Normal left and right heart filling pressures Significant respiratory variation, but cardiomems correlates well with RHC numbers Reduced cardiac output by assumed Fick, though PA saturation similar to previous cath in 2023 Elevated SVR of 2857dynes/sec*cm-5 RECOMMENDATIONS: Increase afterload reduction, hold diuretics today, start back orals tomorrow When evaluating cardiomems, would look at the PA tracings and use the end expiratory values when looking at [name]   ECHOCARDIOGRAM COMPLETE Result Date: 12/12/2023    ECHOCARDIOGRAM REPORT   Patient Name:   WM FRUCHTER Date of Exam: 12/12/2023 Medical Rec #:  [id number]         Height:       57.0 in Accession #:    [id number]        Weight:       142.2 lb Date of Birth:  04/20/1990         BSA:          1.556 m Patient Age:    33 years  BP:           118/73 mmHg Patient Gender: F                 HR:           69 bpm. Exam Location:   Inpatient Procedure: 2D Echo, Color Doppler and Cardiac Doppler (Both Spectral and Color            Flow Doppler were utilized during procedure). Indications:    I50.9* Heart failure (unspecified)  History:        Patient has prior history of Echocardiogram examinations, most                 recent 08/16/2023. CHF; Risk Factors:Hypertension and Diabetes.  Sonographer:    Damien Senior RDCS Referring Phys: [phone] [name] D CLEGG  Sonographer Comments: Ordered without definity . IMPRESSIONS  1. Left ventricular ejection fraction, by estimation, is 45 to 50%. The left ventricle has mildly decreased function. The left ventricle has no regional wall motion abnormalities. There is mild concentric left ventricular hypertrophy. Left ventricular diastolic parameters are consistent with Grade II diastolic dysfunction (pseudonormalization).  2. Right ventricular systolic function is low normal. The right ventricular size is normal. There is normal pulmonary artery systolic pressure. The estimated right ventricular systolic pressure is 31.7 mmHg.  3. The mitral valve is normal in structure. Mild mitral valve regurgitation.  4. The aortic valve is normal in structure. Aortic valve regurgitation is trivial.  5. The inferior vena cava is normal in size with greater than 50% respiratory variability, suggesting right atrial pressure of 3 mmHg. FINDINGS  Left Ventricle: Left ventricular ejection fraction, by estimation, is 45 to 50%. The left ventricle has mildly decreased function. The left ventricle has no regional wall motion abnormalities. The left ventricular internal cavity size was normal in size. There is mild concentric left ventricular hypertrophy. Left ventricular diastolic function could not be evaluated due to nondiagnostic images. Left ventricular diastolic parameters are consistent with Grade II diastolic dysfunction (pseudonormalization). Right Ventricle: The right ventricular size is normal. No increase in right ventricular  wall thickness. Right ventricular systolic function is low normal. There is normal pulmonary artery systolic pressure. The tricuspid regurgitant velocity is 2.68 m/s,  and with an assumed right atrial pressure of 3 mmHg, the estimated right ventricular systolic pressure is 31.7 mmHg. Left Atrium: Left atrial size was normal in size. Right Atrium: Right atrial size was normal in size. Pericardium: There is no evidence of pericardial effusion. Mitral Valve: The mitral valve is normal in structure. Mild mitral valve regurgitation. Tricuspid Valve: The tricuspid valve is normal in structure. Tricuspid valve regurgitation is mild. Aortic Valve: The aortic valve is normal in structure. Aortic valve regurgitation is trivial. Pulmonic Valve: The pulmonic valve was grossly normal. Pulmonic valve regurgitation is mild. Aorta: The aortic root and ascending aorta are structurally normal, with no evidence of dilitation. Venous: The inferior vena cava is normal in size with greater than 50% respiratory variability, suggesting right atrial pressure of 3 mmHg. IAS/Shunts: No atrial level shunt detected by color flow Doppler.  LEFT VENTRICLE PLAX 2D LVIDd:         4.50 cm   Diastology LVIDs:         3.60 cm   LV e' medial:    4.79 cm/s LV PW:         1.10 cm   LV E/e' medial:  17.2 LV IVS:        1.00  cm   LV e' lateral:   7.29 cm/s LVOT diam:     1.80 cm   LV E/e' lateral: 11.3 LV SV:         37 LV SV Index:   24 LVOT Area:     2.54 cm  RIGHT VENTRICLE RV S prime:     9.79 cm/s TAPSE (M-mode): 1.9 cm LEFT ATRIUM             Index        RIGHT ATRIUM           Index LA diam:        3.00 cm 1.93 cm/m   RA Area:     11.60 cm LA Vol (A2C):   35.6 ml 22.88 ml/m  RA Volume:   24.20 ml  15.55 ml/m LA Vol (A4C):   31.1 ml 19.99 ml/m LA Biplane Vol: 34.3 ml 22.04 ml/m  AORTIC VALVE LVOT Vmax:   75.00 cm/s LVOT Vmean:  57.800 cm/s LVOT VTI:    0.144 m  AORTA Ao Root diam: 2.40 cm Ao Asc diam:  2.80 cm MITRAL VALVE                TRICUSPID VALVE MV Area (PHT): 3.60 cm    TR Peak grad:   28.7 mmHg MV Decel Time: 211 msec    TR Vmax:        268.00 cm/s MV E velocity: 82.30 cm/s MV A velocity: 28.30 cm/s  SHUNTS MV E/A ratio:  2.91        Systemic VTI:  0.14 m                            Systemic Diam: 1.80 cm Aditya Sabharwal Electronically signed by Ria Commander Signature Date/Time: 12/12/2023/5:25:41 PM    Final    US  Abdomen Limited RUQ (LIVER/GB) Result Date: 12/10/2023 CLINICAL DATA:  33 year old female undergoing evaluation for liver function, ascites. EXAM: ULTRASOUND ABDOMEN LIMITED RIGHT UPPER QUADRANT COMPARISON:  CT Abdomen and Pelvis 09/05/2023. FINDINGS: Gallbladder: No gallstones or wall thickening visualized. No sonographic Murphy sign noted by sonographer. Common bile duct: Diameter: 5 mm, normal. Liver: Echogenic liver (image 12). No discrete liver lesion by ultrasound. No intrahepatic biliary ductal dilatation. Portal vein is patent on color Doppler imaging with normal direction of blood flow towards the liver. Other: Negative visible right kidney. No ascites identified on imaging of all 4 quadrants. IMPRESSION: 1. Evidence of Fatty Liver Disease. No discrete liver lesion. No ascites. 2. Negative gallbladder and bile ducts. Electronically Signed   By: VEAR Hurst M.D.   On: 12/10/2023 07:44   DG Chest 2 View Result Date: 12/07/2023 EXAM: 2 VIEW(S) XRAY OF THE CHEST 12/07/2023 02:53:39 AM COMPARISON: None available. CLINICAL HISTORY: [id number] SOB (shortness of breath) [id number]. Table formatting from the original note was not included. Reports several days of abdominal distention and suspected fluid overload. Says she has gained 5lbs this week and stomach pain has become unbearable. FINDINGS: LUNGS AND PLEURA: No focal pulmonary opacity. No pulmonary edema. No pleural effusion. No pneumothorax. HEART AND MEDIASTINUM: Stable cardiomegaly. CardioMEMS device. BONES AND SOFT TISSUES: No acute osseous abnormality. IMPRESSION: 1. No  acute process. Electronically signed by: Norman Gatlin MD 12/07/2023 02:56 AM EDT RP Workstation: [id number]    Microbiology: Results for orders placed or performed during the hospital encounter of 08/29/23  Urine Culture (for pregnant, neutropenic or urologic patients or patients with an indwelling  urinary catheter)     Status: Abnormal   Collection Time: 08/30/23  9:01 PM   Specimen: Urine, Clean Catch  Result Value Ref Range Status   Specimen Description   Final    URINE, CLEAN CATCH Performed at [facility], [address] [place], [place] [address]    Special Requests   Final    NONE Performed at [facility], [address], [place], [place] [address]    Culture MULTIPLE SPECIES PRESENT, SUGGEST RECOLLECTION (A)  Final   Report Status 08/31/2023 FINAL  Final    Labs: CBC: Recent Labs  Lab 12/28/23 1425 12/28/23 1609 12/29/23 0211  WBC 10.4  --  8.8  HGB 11.4* 11.9* 11.8*  HCT 34.3* 35.0* 31.2*  MCV 88.6  --  89.3  PLT 281  --  183   Basic Metabolic Panel: Recent Labs  Lab 12/29/23 0211 12/30/23 0234 12/31/23 0211 01/01/24 0156 01/02/24 0222  NA 128* 131* 134* 130* 136  K 5.0 4.5 4.1 5.0 4.5  CL 94* 93* 94* 93* 96*  CO2 21* 20* 22 19* 21*  GLUCOSE 322* 354* 85 355* 111*  BUN 50* 67* 78* 81* 88*  CREATININE 1.66* 2.62* 3.40* 3.73* 3.20*  CALCIUM  8.8* 9.2 9.3 8.8* 9.8  MG 2.3 1.9 2.0  --  2.5*   Liver Function Tests: No results for input(s): AST, ALT, ALKPHOS, BILITOT, PROT, ALBUMIN  in the last 168 hours. CBG: Recent Labs  Lab 01/02/24 0010 01/02/24 0403 01/02/24 0728 01/02/24 1124 01/02/24 1529  GLUCAP 109* 131* 176* 281* 281*    Discharge time spent: greater than 30 minutes.  Signed: Elidia Toribio Furnace, MD [facility] 01/02/2024

## 2024-01-02 NOTE — Telephone Encounter (Signed)
 Pharmacy Patient Advocate Encounter  Received notification from [facility] that Prior Authorization for Ivabradine  HCl 7.5MG  tab has been APPROVED from 01/02/2024 to 03/27/2025   PA #/Case ID/Reference #: [id number]

## 2024-01-02 NOTE — Progress Notes (Signed)
 Reviewed AVS, patient expressed understanding of medications, MD follow up reviewed.   Removed IV, Site clean, dry and intact.   Patient states all belongings brought to the hospital at time of admission are accounted for and packed to take home.  Picked up medications from [facility] pharmacy. [facility] contacted to transport patient to Discharge lounge to wait for transportation home.

## 2024-01-02 NOTE — Progress Notes (Signed)
 Patient ID: Jennifer [name], female   DOB: 12-08-1990, 33 y.o.   MRN: [id number] Has history of labial abscesses drained.  Now has area right mons that is erythematous. No real fluctuance. I think abx are fine and nothing really to drain. If not better can see gyn or urgent care

## 2024-01-02 NOTE — Assessment & Plan Note (Addendum)
 Right thigh induration, no frank abscess per general surgery evaluation.  Patient will be placed on oral antibiotic therapy with cephalexin and instructions to follow up as outpatient.

## 2024-01-02 NOTE — Progress Notes (Addendum)
 Progress Note   Patient: Jennifer [name] FMW:[id number] DOB: 1990-09-14 DOA: 12/28/2023     5 DOS: the patient was seen and examined on 01/02/2024   Brief hospital course: Jennifer [name] was admitted to the hospital with the working diagnosis of heart failure exacerbation.   33 yo female with the past medical history of hypertension, hyperlipidemia, T2Dm, chronic pancreatitis and heart failure who presented with abdominal distention and rapid weight gain.  Recent hospitalization for heat failure 09/11 to 12/14/23 for heart failure exacerbation.  Apparently at home she has not been compliant with her medications.  On the day of hospitalization she was evaluated at the outpatient cardiology clinic and found volume overloaded with uncontrolled hypertension and tachycardia. 7 lbs weight gain since she was discharged from the hospital in September.  She was referred to the hospital for further diuresis.  On her initial physical examination her blood pressure was 137/82, HR 133, RR 26 and 02 saturation 97% Lungs with no wheezing or rhonchi, heart with S1 and S2 present and tachycardic, abdomen distended but not tender, no lower extremity edema.    Na 135, K 4.7 Cl 104, glucose 152 bun 67 cr 2,10  High sensitive troponin 21 and 20  Wbc 10,4 hgb 11.4 plt 281   Chest radiograph with hypoinflation, positive cardiomegaly, bilateral hilar vascular congestion and small central bilateral interstitial infiltrates.   EKG 135 bpm, left axis deviation, normal intervals qtc 489, sinus rhythm with right atrial enlargement, no significant ST segment or T wave changes, positive LVH.   10/05 improved volume status, pending renal function to improve prior to discharge,  10/06 worsening renal function, checking for ascites.   Assessment and Plan: * Acute on chronic diastolic CHF (congestive heart failure) (HCC) Echocardiogram with mildly reduced LV systolic function with EF 45 to 50%, mild LVH, grade II  diastolic dysfunction (pseudo-normalization EA), RVSP 31.7 mmHg,    Patient was placed on IV furosemide  for diuresis, negative fluid balance was achieved, -5.011 ml, with significant improvement in her symptoms.   Continue with ivabradine  for tachycardia.  Afterload reduction with hydralazine  and isosorbide . Resume loop diuretic at the time of discharge.  Patient has been advised to be adherent to her medications.   Essential hypertension Continue blood pressure monitoring   Coronary artery disease involving native coronary artery of native heart without angina pectoris No angina, elevation in high sensitive troponin due to volume overload.  No acute coronary syndrome.   Continue aspirin  and atorvastatin .   Stage 3b chronic kidney disease (CKD) (HCC) AKI, Hyponatremia, baseline cr 2,5 to 3.0   Volume status has improved, serum cr is trending down to 3,20 with K at 4,5 and serum bicarbonate at 21 Na 136 and Mg 2.5   Plan to continue loop diuretic at home Follow up renal function and electrolytes as outpatient.  Hold on furosemide  for now   Chronic pancreatitis (HCC) No acute flare, no abdominal pain, nausea or vomiting.   History of astrocytoma of brain Follow up as outpatient.   Type 2 diabetes mellitus with hyperlipidemia (HCC) Patient was placed on her insulin  U500  Dose had to be adjusted for hypoglycemia, during her hospitalization  At the time of discharge will resume her home dose and will follow up with [facility] as outpatient.   Patient with significant insulin  resistance.  Positive hyperglycemia.   Continue statin and fenofibrate .   Hypothyroidism Continue levothyroxine    Sleep apnea Cpap  Depression continue with duloxetine .   Abscess of right thigh Patient  developed a right thigh abscess.  Consulted surgery for evaluation.       Subjective: Patient is feeling better, no chest pain, no dyspnea, no orthopnea or PNC   Physical Exam: Vitals:    01/02/24 0400 01/02/24 0600 01/02/24 0728 01/02/24 1124  BP: 134/71 138/65 (!) 161/75 (!) 145/79  Pulse: 93  95 100  Resp:   19 19  Temp: 98.1 F (36.7 C)  98.6 F (37 C) 98.5 F (36.9 C)  TempSrc: Oral  Oral Oral  SpO2: 100%  97% 96%  Weight: 64.4 kg     Height:       Neurology awake and alert ENT with mild pallor with no icterus Cardiovascular with S1 and S2 present and regular with no gallops rubs or murmurs Respiratory with no rales or wheezing, no rhonchi  Abdomen with no distention  No lower extremity edema  Right thigh internal region proximal, fluctuant lesion about 5 cm long to 2 cm wide, mild erythema and tender to touch. Possible local skin abscess.   Data Reviewed:    Family Communication: no family at the bedside   Disposition: Status is: Inpatient Remains inpatient appropriate because: pending right thigh abscess evaluation   Planned Discharge Destination: Home      Author: Elidia Toribio Furnace, MD 01/02/2024 12:35 PM  For on call review [email or web address].

## 2024-01-02 NOTE — TOC Transition Note (Signed)
 Transition of Care [facility]) - Discharge Note   Patient Details  Name: Jennifer [name] MRN: [id number] Date of Birth: December 10, 1990  [facility] CM/SW Contact:  Waddell Barnie Rama, RN Phone Number: 01/02/2024, 4:15 PM   Clinical Narrative:    For dc today, has transport, has PCP follow up.     Barriers to Discharge: Continued Medical Work up   Patient Goals and CMS Choice Patient states their goals for this hospitalization and ongoing recovery are:: wants to remain independent          Discharge Placement                       Discharge Plan and Services Additional resources added to the After Visit Summary for     Discharge Planning Services: CM Consult                                 Social Drivers of Health (SDOH) Interventions SDOH Screenings   Food Insecurity: No Food Insecurity (12/28/2023)  Housing: Low Risk  (12/28/2023)  Transportation Needs: No Transportation Needs (12/28/2023)  Utilities: Not At Risk (12/28/2023)  Alcohol  Screen: Low Risk  (12/10/2021)  Depression (PHQ2-9): Low Risk  (12/10/2021)  Financial Resource Strain: Low Risk  (10/24/2023)   Received from [facility]  Physical Activity: Unknown (10/24/2023)   Received from [facility]  Social Connections: Patient Declined (10/24/2023)   Received from [facility]  Stress: Patient Declined (10/24/2023)   Received from [facility]  Tobacco Use: Low Risk  (12/28/2023)     Readmission Risk Interventions    12/11/2023    3:37 PM 08/21/2023    2:55 PM 05/08/2023    4:21 PM  Readmission Risk Prevention Plan  Transportation Screening Complete Complete Complete  Medication Review Oceanographer) Complete Complete Referral to Pharmacy  PCP or Specialist appointment within 3-5 days of discharge  Complete Complete  HRI or Home Care Consult Complete Complete Complete  SW Recovery Care/Counseling Consult  Complete Complete  Palliative Care Screening Not Applicable Not  Applicable Not Applicable  Skilled Nursing Facility Not Applicable Complete Not Applicable

## 2024-01-03 ENCOUNTER — Other Ambulatory Visit (HOSPITAL_COMMUNITY): Payer: Self-pay

## 2024-01-03 NOTE — Progress Notes (Signed)
 Paramedicine Encounter    Patient ID: Jennifer [name], female    DOB: [birth year], 33 y.o.   MRN: [id number]  Pt was d/c yesterday. Came out today for med rec.  There were some changes at d/c.  Meds verified and pill box refilled according to d/c instructions except the torsemide -  she said they told her to wait until she sees cardio next week and note in chart says to resume at 20mg  daily and then d/c papers say to resume at 60mg  daily. So asked triage to call back ref that clarification.  Pt reports feeling much better now.  Clarification to resume at 60mg  daily so that was done in pill box. She has appoint on Monday with clinic and has dr mona appoint tomor.   B/p-158/90 HR-110 SP02-98%    Patient Care Team: [name] as PCP - General (Physician Assistant) Tommas Pears, MD as PCP - Endocrinology (Endocrinology) Ladona Heinz, MD as PCP - Cardiology (Cardiology)  Patient Active Problem List   Diagnosis Date Noted   Skin infection 01/02/2024   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) (HCC) 12/28/2023   Hypertensive urgency 12/28/2023   Acute exacerbation of CHF (congestive heart failure) (HCC) 12/07/2023   Controlled type 2 diabetes mellitus with hyperglycemia (HCC) 08/31/2023   Nausea without vomiting 08/31/2023   Epigastric pain 08/31/2023   Sleep apnea 08/30/2023   Abscess 08/30/2023   SOB (shortness of breath) 08/20/2023   Dizziness 08/20/2023   Chronic pancreatitis (HCC) 08/15/2023   Witnessed episode of apnea 06/27/2023   Pseudohyponatremia 04/10/2023   Metabolic acidosis 04/10/2023   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 coli UTI (urinary tract infection) 06/01/2022   DKA (diabetic ketoacidosis) (HCC) 06/01/2022   ASCUS of cervix with negative high risk HPV 05/31/2022   Acute on chronic pancreatitis (HCC) 05/29/2022   Hypokalemia 03/23/2022   COVID 03/15/2022   COVID-19 virus infection 03/14/2022   Hyponatremia 03/14/2022   Positive D dimer 03/14/2022   Stage 3b chronic kidney disease (CKD) (HCC) 03/14/2022   Pulmonary hypertension (HCC) 02/06/2022   NASH (nonalcoholic steatohepatitis) 02/06/2022   Obesity (BMI 30-39.9) 02/06/2022   Heart failure (HCC) 12/03/2021   Acute on chronic diastolic CHF (congestive heart failure) (HCC) 12/02/2021   Elevated troponin 12/02/2021   Pancytopenia (HCC) 12/02/2021   Amenorrhea 12/02/2021   Hepatic steatosis 09/27/2021   Hyperosmolar hyperglycemic state (HHS) (HCC) 08/26/2021   Small intestinal bacterial overgrowth (SIBO) 08/26/2021   Lower extremity edema 08/26/2021   Hyperkalemia 08/14/2021   Hx of insulin  dependent diabetes mellitus 08/14/2021   CKD stage 3b, GFR 30-44 ml/min (HCC) 05/13/2021   Gastroparesis due to DM (HCC) 05/12/2021   Abdominal distension 05/12/2021   Chest pain 03/10/2021   Acute on chronic combined systolic and diastolic CHF (congestive heart failure) (HCC) 02/09/2021   History of astrocytoma of brain 02/09/2021   Type 2 diabetes mellitus with hyperglycemia, with long-term current use of insulin  (HCC) 01/25/2021   Diarrhea  Elevated transaminase level    Orthostatic hypotension 11/28/2020   Acute combined systolic and diastolic heart failure (HCC)    Nonischemic cardiomyopathy (HCC)    Acute systolic congestive heart failure (HCC)    Elevated liver enzymes    Acute renal failure superimposed on stage 3b  chronic kidney disease (HCC) 11/26/2020   Intractable abdominal pain 11/23/2020   Chronic systolic congestive heart failure (HCC) 11/23/2020   Essential hypertension 11/23/2020   Familial hypertriglyceridemia 11/23/2020   Prolonged QT interval 11/23/2020   Finger mass, left 08/12/2020   Nausea and vomiting 07/23/2020   Polycystic ovary syndrome 01/30/2020   Moderate aortic insufficiency 08/16/2019   Moderate mitral regurgitation 08/16/2019   Lipoprotein lipase deficiency, familial 06/19/2019   Microalbuminuria due to type 1 diabetes mellitus (HCC) 06/19/2019   Type 2 diabetes mellitus with hyperlipidemia (HCC) 10/10/2018   Hypothyroidism 10/10/2018    Current Outpatient Medications:    albuterol  (VENTOLIN  HFA) 108 (90 Base) MCG/ACT inhaler, Inhale 2 puffs into the lungs daily as needed for wheezing or shortness of breath., Disp: , Rfl:    aspirin  EC 81 MG tablet, Take 1 tablet (81 mg total) by mouth daily. Swallow whole. (Patient not taking: No sig reported), Disp: 30 tablet, Rfl: 1   atorvastatin  (LIPITOR) 80 MG tablet, Take 1 tablet (80 mg total) by mouth every evening., Disp: 30 tablet, Rfl: 0   azelastine  (ASTELIN ) 0.1 % nasal spray, Place 2 sprays into both nostrils daily as needed for rhinitis or allergies., Disp: , Rfl:    BAQSIMI ONE PACK 3 MG/DOSE POWD, Place 1 Dose into the nose once as needed (hypoglycemia)., Disp: , Rfl:    calcitRIOL  (ROCALTROL ) 0.25 MCG capsule, Take 0.25 mcg by mouth daily., Disp: , Rfl:    cephALEXin (KEFLEX) 500 MG capsule, Take 1 capsule (500 mg total) by mouth every 12 (twelve) hours for 5 days., Disp: 10 capsule, Rfl: 0   dicyclomine  (BENTYL ) 20 MG tablet, Take 1 tablet (20 mg total) by mouth 2 (two) times daily., Disp: 20 tablet, Rfl: 0   DULoxetine  (CYMBALTA ) 60 MG capsule, Take 60 mg by mouth at bedtime., Disp: , Rfl:    ergocalciferol  (VITAMIN D2) 1.25 MG (50000 UT) capsule, Take 1 capsule (50,000 Units total) by mouth every Sunday., Disp: 4  capsule, Rfl: 0   fenofibrate  54 MG tablet, Take 1 tablet (54 mg total) by mouth daily., Disp: 30 tablet, Rfl: 0   Glucose Blood (BLOOD GLUCOSE TEST STRIPS) STRP, 1 each by Does not apply route 3 (three) times daily. Use as directed to check blood sugar. May dispense any manufacturer covered by patient's insurance and fits patient's device., Disp: 100 strip, Rfl: 0   HUMALOG  KWIKPEN 100 UNIT/ML KwikPen, Inject 60 Units into the skin in the morning, at noon, and at bedtime., Disp: , Rfl:    HUMULIN  R U-500 KWIKPEN 500 UNIT/ML KwikPen, Inject 300 Units into the skin in the morning and at bedtime., Disp: , Rfl:    hydrALAZINE  (APRESOLINE ) 100 MG tablet, Take 1 tablet (100 mg total) by mouth every 8 (eight) hours., Disp: 90 tablet, Rfl: 0   Insulin  Pen Needle (PEN NEEDLES) 31G X 5 MM MISC, Use as directed 3 (three) times daily., Disp: 100 each, Rfl: 0   isosorbide  mononitrate (IMDUR ) 60 MG 24 hr tablet, Take 1 tablet (60 mg total) by mouth daily., Disp: 30 tablet, Rfl: 0   ivabradine  (CORLANOR ) 7.5 MG TABS tablet, Take 1 tablet (7.5 mg total) by mouth 2 (two) times  daily with a meal., Disp: 60 tablet, Rfl: 0   Lancet Device MISC, 1 each by Does not apply route 3 (three) times daily. May dispense any manufacturer covered by patient's insurance., Disp: 1 each, Rfl: 0   Lancets MISC, 1 each by Does not apply route 3 (three) times daily. Use as directed to check blood sugar. May dispense any manufacturer covered by patient's insurance and fits patient's device., Disp: 100 each, Rfl: 0   lansoprazole  (PREVACID ) 30 MG capsule, Take 1 capsule (30 mg total) by mouth 2 (two) times daily before a meal., Disp: 60 capsule, Rfl: 2   levocetirizine (XYZAL ) 5 MG tablet, Take 5 mg by mouth at bedtime., Disp: , Rfl:    levothyroxine  (SYNTHROID ) 150 MCG tablet, Take 150 mcg by mouth daily before breakfast., Disp: , Rfl:    pregabalin  (LYRICA ) 300 MG capsule, Take 300 mg by mouth at bedtime., Disp: , Rfl:     prochlorperazine  (COMPAZINE ) 10 MG tablet, Take 10 mg by mouth daily as needed for nausea or vomiting., Disp: , Rfl:    promethazine  (PHENERGAN ) 25 MG tablet, Take 25 mg by mouth at bedtime., Disp: , Rfl:    Safety Seal Miscellaneous MISC, Hormonic Hair Solution with minoxidil USP 7% and finasteride USP 0.05% - apply to affected areas daily every morning., Disp: 30 mL, Rfl: 2   SUMAtriptan  (IMITREX ) 100 MG tablet, Take 100 mg by mouth daily as needed. (Patient not taking: Reported on 12/29/2023), Disp: , Rfl:    torsemide  (DEMADEX ) 20 MG tablet, Take 3 tablets (60 mg total) by mouth daily. (Patient taking differently: Take 60 mg by mouth every evening.), Disp: 90 tablet, Rfl: 2   tretinoin  (RETIN-A ) 0.05 % cream, Apply 1 Application topically at bedtime., Disp: , Rfl:  Allergies  Allergen Reactions   Icosapent  Ethyl (Epa Ethyl Ester) (Fish) Hives   Ketamine Other (See Comments)    In a vegetative state for 15 minutes, per pt   Maitake Itching and Other (See Comments)    Itchy throat   Morphine  Hives, Itching, Rash and Other (See Comments)    Sore throat, too   Mushroom Other (See Comments)    Pt has never had mushrooms but tested positive on allergy test   Penicillins Hives, Itching and Rash    Tolerates cephalosporins   Shellfish Allergy Other (See Comments)    Pt has never had shellfish, but tested positive on allergy test. Pt states contrast in CT is okay.   Fentanyl  Nausea And Vomiting   Prednisone Rash      Social History   Socioeconomic History   Marital status: Divorced    Spouse name: Not on file   Number of children: 0   Years of education: Not on file   Highest education level: Not on file  Occupational History   Occupation: Dance movement psychotherapist   Occupation: N/A  Tobacco Use   Smoking status: Never   Smokeless tobacco: Never  Vaping Use   Vaping status: Never Used  Substance and Sexual Activity   Alcohol  use: Never   Drug use: Never   Sexual activity: Yes  Other  Topics Concern   Not on file  Social History Narrative   Not on file   Social Drivers of Health   Financial Resource Strain: Low Risk  (10/24/2023)   Received from [facility]   Overall Financial Resource Strain (CARDIA)    How hard is it for you to pay for the very basics like food, housing, medical  care, and heating?: Not very hard  Food Insecurity: No Food Insecurity (12/28/2023)   Hunger Vital Sign    Worried About Running Out of Food in the Last Year: Never true    Ran Out of Food in the Last Year: Never true  Transportation Needs: No Transportation Needs (12/28/2023)   PRAPARE - Administrator, Civil Service (Medical): No    Lack of Transportation (Non-Medical): No  Physical Activity: Unknown (10/24/2023)   Received from [facility]   Exercise Vital Sign    On average, how many days per week do you engage in moderate to strenuous exercise (like a brisk walk)?: Patient declined    Minutes of Exercise per Session: Not on file  Stress: Patient Declined (10/24/2023)   Received from [facility] - Occupational Stress Questionnaire    Do you feel stress - tense, restless, nervous, or anxious, or unable to sleep at night because your mind is troubled all the time - these days?: Patient declined  Social Connections: Patient Declined (10/24/2023)   Received from [facility]   Social Network    How would you rate your social network (family, work, friends)?: Patient declined  Intimate Partner Violence: Not At Risk (12/28/2023)   Humiliation, Afraid, Rape, and Kick questionnaire    Fear of Current or Ex-Partner: No    Emotionally Abused: No    Physically Abused: No    Sexually Abused: No    Physical Exam      Future Appointments  Date Time Provider Department Center  01/04/2024  8:20 AM Mona Vinie BROCKS, MD [facility] H&V  01/08/2024  2:30 PM [facility] PA/NP [facility] None  02/05/2024  9:30 AM Hope Almarie ORN, NP  [facility] [address]  02/05/2024  2:00 PM MC ECHO OP 1 [facility]  02/05/2024  3:00 PM [name], Toribio SAUNDERS, MD [facility] None  05/07/2024  3:15 PM Alm Jennifer SAILOR, DO [facility] None       Izetta Quivers, Paramedic [phone] [facility] Paramedic  01/03/24

## 2024-01-04 ENCOUNTER — Other Ambulatory Visit (HOSPITAL_COMMUNITY): Payer: Self-pay

## 2024-01-04 ENCOUNTER — Telehealth: Payer: Self-pay | Admitting: Pharmacy Technician

## 2024-01-04 ENCOUNTER — Ambulatory Visit: Attending: Cardiology | Admitting: Internal Medicine

## 2024-01-04 ENCOUNTER — Encounter: Payer: Self-pay | Admitting: Internal Medicine

## 2024-01-04 VITALS — BP 158/80 | HR 116 | Ht <= 58 in | Wt 143.4 lb

## 2024-01-04 DIAGNOSIS — E783 Hyperchylomicronemia: Secondary | ICD-10-CM

## 2024-01-04 DIAGNOSIS — K861 Other chronic pancreatitis: Secondary | ICD-10-CM | POA: Diagnosis not present

## 2024-01-04 DIAGNOSIS — I5020 Unspecified systolic (congestive) heart failure: Secondary | ICD-10-CM

## 2024-01-04 NOTE — Telephone Encounter (Signed)
 Faxed Tryngolza enrollment form to [employer] at [phone]

## 2024-01-04 NOTE — Telephone Encounter (Signed)
 Pharmacy Patient Advocate Encounter  Received notification from [facility] that Prior Authorization for tryngolza has been APPROVED from 01/04/24 to 03/27/25   PA #/Case ID/Reference #: [id number]

## 2024-01-04 NOTE — Telephone Encounter (Signed)
   Pharmacy Patient Advocate Encounter   Received notification from Pt Calls Messages that prior authorization for tryngolza is required/requested.   Insurance verification completed.   The patient is insured through [employer].   Per test claim: PA required; PA submitted to above mentioned insurance via Latent Key/confirmation #/EOC [id number] Status is pending

## 2024-01-04 NOTE — Progress Notes (Addendum)
 LIPID CLINIC CONSULT NOTE  Chief Complaint:  Elevated triglycerides  Primary Care Physician: Emilio Joesph DEL, PA-C  Primary Cardiologist:  [name], MD  HPI:  Jennifer [name] is a 33 y.o. female who is being seen today for the evaluation of elevated triglycerides at the request of Emilio Joesph DEL, PA-C.  This is a medically complex 33 year old female kindly referred for evaluation of hypertriglyceridemia.  This is a longstanding issue initially thought to be due to lipoprotein lipase deficiency however she is cared for by [facility] endocrinology and was seen by a [facility] pediatric geneticist who carried out whole exome gene sequencing that failed to identify a cause of her hypertriglyceridemia.  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  She has subsequently been maintained on fenofibrate  160 mg daily.  Recent review of her lab work, however shows that she does have a history of chronic kidney disease which was significant in July with a creatinine of 2.62 however improved to 1.43 more recently.  This with estimated GFR less than 50, however.  [name] not on any additional triglyceride lowering therapies.  04/05/2023  Jennifer [name] is back today for follow-up.  She has continued to struggle with high triglycerides.  Unfortunately she could not take Vascepa  because of a newly identified fish allergy.  She says she is going to try to establish with an allergist to see if possibly she could become desensitized to that.  She has had recurrent hospitalizations with high triglycerides.  She denies recurrent pancreatitis.  She was last hospitalized in November 2024 with heart failure.  She has not had recent assessment of triglycerides.  We did discuss today that there is a newly approved medication for triglyceride lowering that she might be a candidate for.  01/04/2024  Jennifer [name] is seen today in follow-up.  She has been hospitalized  unfortunately number of times for recurrent heart failure but not recently for recurrent pancreatitis although her triglycerides 3 weeks ago were 4425.  Prior to that she had been in the 5-600 range.  Further review of her chart indicates that she was previously diagnosed genetically with a lipoprotein lipase deficiency and had a variant in the ABCG8 gene, which could be associated with MODY or also possible sitosterolemia.  I am concerned about recurrent episodes of pancreatitis.  Based on her LPL deficiency which was apparently confirmed at [facility] as well as [facility] in the past, I think she would qualify for [employer].  PMHx:  Past Medical History:  Diagnosis Date   Afib (HCC) 05/12/2021   Brain tumor (HCC) 03/29/1995   astrocytoma   CHF (congestive heart failure) (HCC)    Cholesterosis    CKD (chronic kidney disease) stage 4, GFR 15-29 ml/min (HCC) 05/13/2021   DM (diabetes mellitus) (HCC) 10/10/2018   Fatty liver    Gastroparesis due to DM (HCC) 05/12/2021   HTN (hypertension) 10/10/2018   Hypothyroidism 10/10/2018   Lipoprotein deficiency    Lung disease    longevity long term   Pancreatitis    Polycystic ovary syndrome     Past Surgical History:  Procedure Laterality Date   ABDOMINAL SURGERY     pt states during miscarriage got her intestine   BRAIN SURGERY     ESOPHAGOGASTRODUODENOSCOPY N/A 09/01/2023   Procedure: EGD (ESOPHAGOGASTRODUODENOSCOPY);  Surgeon: Abran Norleen SAILOR, MD;  Location: [facility];  Service: Gastroenterology;  Laterality: N/A;   EYE MUSCLE SURGERY  Right 03/28/2014   PRESSURE SENSOR/CARDIOMEMS N/A 12/06/2021   Procedure: PRESSURE SENSOR/CARDIOMEMS;  Surgeon: [name] Toribio SAUNDERS, MD;  Location: [facility];  Service: Cardiovascular;  Laterality: N/A;   RIGHT HEART CATH N/A 08/05/2021   Procedure: RIGHT HEART CATH;  Surgeon: [name] Toribio SAUNDERS, MD;  Location: [facility];  Service: Cardiovascular;  Laterality: N/A;   RIGHT HEART CATH N/A  12/06/2021   Procedure: RIGHT HEART CATH;  Surgeon: [name] Toribio SAUNDERS, MD;  Location: [facility];  Service: Cardiovascular;  Laterality: N/A;   RIGHT HEART CATH N/A 12/13/2023   Procedure: RIGHT HEART CATH;  Surgeon: Zenaida Morene PARAS, MD;  Location: [facility] INVASIVE CV LAB;  Service: Cardiovascular;  Laterality: N/A;   RIGHT/LEFT HEART CATH AND CORONARY ANGIOGRAPHY N/A 12/03/2020   Procedure: RIGHT/LEFT HEART CATH AND CORONARY ANGIOGRAPHY;  Surgeon: Ladona Heinz, MD;  Location: [facility];  Service: Cardiovascular;  Laterality: N/A;   VENTRICULOSTOMY  03/28/1997    FAMHx:  Family History  Problem Relation Age of Onset   Diabetes Mother    Hypertension Mother    Hyperlipidemia Mother    Thyroid  disease Mother    Hypertension Father    Diabetes Father    Pancreatic cancer Paternal Aunt    Pancreatic cancer Paternal Uncle    Colon cancer Neg Hx    Esophageal cancer Neg Hx    Stomach cancer Neg Hx     SOCHx:   reports that she has never smoked. She has never used smokeless tobacco. She reports that she does not drink alcohol  and does not use drugs.  ALLERGIES:  Allergies  Allergen Reactions   Icosapent  Ethyl (Epa Ethyl Ester) (Fish) Hives   Ketamine Other (See Comments)    In a vegetative state for 15 minutes, per pt   Maitake Itching and Other (See Comments)    Itchy throat   Morphine  Hives, Itching, Rash and Other (See Comments)    Sore throat, too   Mushroom Other (See Comments)    Pt has never had mushrooms but tested positive on allergy test   Penicillins Hives, Itching and Rash    Tolerates cephalosporins   Shellfish Allergy Other (See Comments)    Pt has never had shellfish, but tested positive on allergy test. Pt states contrast in CT is okay.   Fentanyl  Nausea And Vomiting   Prednisone Rash    ROS: Pertinent items noted in HPI and remainder of comprehensive ROS otherwise negative.  HOME MEDS: Current Outpatient Medications on File Prior to Visit   Medication Sig Dispense Refill   albuterol  (VENTOLIN  HFA) 108 (90 Base) MCG/ACT inhaler Inhale 2 puffs into the lungs daily as needed for wheezing or shortness of breath.     aspirin  EC 81 MG tablet Take 1 tablet (81 mg total) by mouth daily. Swallow whole. 30 tablet 1   atorvastatin  (LIPITOR) 80 MG tablet Take 1 tablet (80 mg total) by mouth every evening. 30 tablet 0   azelastine  (ASTELIN ) 0.1 % nasal spray Place 2 sprays into both nostrils daily as needed for rhinitis or allergies.     BAQSIMI ONE PACK 3 MG/DOSE POWD Place 1 Dose into the nose once as needed (hypoglycemia).     calcitRIOL  (ROCALTROL ) 0.25 MCG capsule Take 0.25 mcg by mouth daily.     cephALEXin (KEFLEX) 500 MG capsule Take 1 capsule (500 mg total) by mouth every 12 (twelve) hours for 5 days. 10 capsule 0   dicyclomine  (BENTYL ) 20 MG tablet  Take 1 tablet (20 mg total) by mouth 2 (two) times daily. 20 tablet 0   DULoxetine  (CYMBALTA ) 60 MG capsule Take 60 mg by mouth at bedtime.     ergocalciferol  (VITAMIN D2) 1.25 MG (50000 UT) capsule Take 1 capsule (50,000 Units total) by mouth every Sunday. 4 capsule 0   fenofibrate  54 MG tablet Take 1 tablet (54 mg total) by mouth daily. 30 tablet 0   Glucose Blood (BLOOD GLUCOSE TEST STRIPS) STRP 1 each by Does not apply route 3 (three) times daily. Use as directed to check blood sugar. May dispense any manufacturer covered by patient's insurance and fits patient's device. 100 strip 0   HUMALOG  KWIKPEN 100 UNIT/ML KwikPen Inject 60 Units into the skin in the morning, at noon, and at bedtime.     HUMULIN  R U-500 KWIKPEN 500 UNIT/ML KwikPen Inject 300 Units into the skin in the morning and at bedtime.     hydrALAZINE  (APRESOLINE ) 100 MG tablet Take 1 tablet (100 mg total) by mouth every 8 (eight) hours. 90 tablet 0   Insulin  Pen Needle (PEN NEEDLES) 31G X 5 MM MISC Use as directed 3 (three) times daily. 100 each 0   isosorbide  mononitrate (IMDUR ) 60 MG 24 hr tablet Take 1 tablet (60 mg total)  by mouth daily. 30 tablet 0   ivabradine  (CORLANOR ) 7.5 MG TABS tablet Take 1 tablet (7.5 mg total) by mouth 2 (two) times daily with a meal. 60 tablet 0   Lancet Device MISC 1 each by Does not apply route 3 (three) times daily. May dispense any manufacturer covered by patient's insurance. 1 each 0   Lancets MISC 1 each by Does not apply route 3 (three) times daily. Use as directed to check blood sugar. May dispense any manufacturer covered by patient's insurance and fits patient's device. 100 each 0   lansoprazole  (PREVACID ) 30 MG capsule Take 1 capsule (30 mg total) by mouth 2 (two) times daily before a meal. 60 capsule 2   levocetirizine (XYZAL ) 5 MG tablet Take 5 mg by mouth at bedtime.     levothyroxine  (SYNTHROID ) 150 MCG tablet Take 150 mcg by mouth daily before breakfast.     pregabalin  (LYRICA ) 300 MG capsule Take 300 mg by mouth at bedtime.     prochlorperazine  (COMPAZINE ) 10 MG tablet Take 10 mg by mouth daily as needed for nausea or vomiting.     promethazine  (PHENERGAN ) 25 MG tablet Take 25 mg by mouth at bedtime.     Safety Seal Miscellaneous MISC Hormonic Hair Solution with minoxidil USP 7% and finasteride USP 0.05% - apply to affected areas daily every morning. 30 mL 2   SUMAtriptan  (IMITREX ) 100 MG tablet Take 100 mg by mouth daily as needed.     torsemide  (DEMADEX ) 20 MG tablet Take 3 tablets (60 mg total) by mouth daily. (Patient taking differently: Take 60 mg by mouth every evening.) 90 tablet 2   tretinoin  (RETIN-A ) 0.05 % cream Apply 1 Application topically at bedtime.     No current facility-administered medications on file prior to visit.    LABS/IMAGING: Results for orders placed or performed during the hospital encounter of 12/28/23 (from the past 48 hours)  Glucose, capillary     Status: Abnormal   Collection Time: 01/02/24 11:24 AM  Result Value Ref Range   Glucose-Capillary 281 (H) 70 - 99 mg/dL    Comment: Glucose reference range applies only to samples taken  after fasting for at least 8 hours.  Glucose, capillary     Status: Abnormal   Collection Time: 01/02/24  3:29 PM  Result Value Ref Range   Glucose-Capillary 281 (H) 70 - 99 mg/dL    Comment: Glucose reference range applies only to samples taken after fasting for at least 8 hours.   No results found.  LIPID PANEL:    Component Value Date/Time   CHOL 798 (H) 12/13/2023 0242   CHOL 409 (H) 04/05/2023 1425   TRIG >4,425 (H) 12/13/2023 0242   HDL NOT REPORTED DUE TO HIGH TRIGLYCERIDES 12/13/2023 0242   HDL 32 (L) 04/05/2023 1425   CHOLHDL NOT REPORTED DUE TO HIGH TRIGLYCERIDES 12/13/2023 0242   VLDL UNABLE TO CALCULATE IF TRIGLYCERIDE OVER 400 mg/dL 90/82/7974 9757   LDLCALC UNABLE TO CALCULATE IF TRIGLYCERIDE OVER 400 mg/dL 90/82/7974 9757   LDLCALC Comment (A) 04/05/2023 1425   LDLDIRECT 48 12/13/2023 0242    WEIGHTS: Wt Readings from Last 3 Encounters:  01/04/24 143 lb 6.4 oz (65 kg)  01/02/24 141 lb 15.6 oz (64.4 kg)  12/28/23 146 lb (66.2 kg)    VITALS: BP (!) 158/80   Pulse (!) 116   Ht 4' 9 (1.448 m)   Wt 143 lb 6.4 oz (65 kg)   SpO2 97%   BMI 31.03 kg/m   EXAM: Deferred  EKG: Deferred  ASSESSMENT: Hyperchylomicronemia, familial (FCS) -triglycerides over 10,000 in the past per her report, history of lipoprotein lipase deficiency confirmed by previous testing (at [facility]).  Abnormal ABCG8 gene variant as well. Chronic systolic congestive heart failure History of recurrent pancreatitis Remote history of astrocytoma  PLAN: 1.   Jennifer [name] has genetic and clinical FCS.  She has had recent lipids including triglycerides that were over 4000 about 3 weeks ago.  She has had a recurrent pancreatitis as well.  Based on these findings she is a good candidate for [employer].  We discussed that today and she is willing to proceed with the medication.  Will reach out for prior authorization based on the following criteria  [place] Criteria:  1. Adherence to low-fat  diet 2. List of current and prior TG-lowering therapies -she has previously undergone lipoprotein a pheresis, but is currently on atorvastatin  80 mg, fenofibrate  54 mg daily, she could not take Vascepa  due to hives 3. H/O acute pancreatitis/hospitalizations for this 4. Genetic test results OR scoring with either [place] (>10) or NAFCS (>60)   Jennifer KYM Maxcy, MD, [facility], FNLA, FACP  [facility]  [facility]  Medical Director of the [facility]  [facility] Diplomate of the American Board of Clinical Lipidology Attending Cardiologist  Direct Dial: [phone]  Fax: [phone]  Website:  [email or web address]   Jennifer [name] 01/04/2024, 8:21 AM

## 2024-01-04 NOTE — Patient Instructions (Signed)
   Lab Work:  Your physician recommends that you return for lab work in: 6 MONTHS-FASTING  If you have labs (blood work) drawn today and your tests are completely normal, you will receive your results only by: [employer] (if you have MyChart) OR A paper copy in the mail If you have any lab test that is abnormal or we need to change your treatment, we will call you to review the results.  Follow-Up: At [facility], you and your health needs are our priority.  As part of our continuing mission to provide you with exceptional heart care, our providers are all part of one team.  This team includes your primary Cardiologist (physician) and Advanced Practice Providers or APPs (Physician Assistants and Nurse Practitioners) who all work together to provide you with the care you need, when you need it.  Your next appointment:   6 month(s)  Provider:   VINIE MAXCY MD

## 2024-01-05 ENCOUNTER — Telehealth (HOSPITAL_COMMUNITY): Payer: Self-pay

## 2024-01-05 NOTE — Telephone Encounter (Signed)
 Called to confirm/remind patient of their appointment at the [facility] on 01/10/24.   Appointment:   [] Confirmed  [x] Left mess   [] No answer/No voice mail  [] VM Full/unable to leave message  [] Phone not in service  And to bring in all medications and/or complete list.

## 2024-01-08 ENCOUNTER — Encounter (HOSPITAL_COMMUNITY): Payer: Self-pay

## 2024-01-08 ENCOUNTER — Other Ambulatory Visit (HOSPITAL_COMMUNITY): Payer: Self-pay

## 2024-01-08 ENCOUNTER — Ambulatory Visit (HOSPITAL_COMMUNITY): Payer: Self-pay | Admitting: Family Medicine

## 2024-01-08 ENCOUNTER — Ambulatory Visit (HOSPITAL_COMMUNITY)
Admission: RE | Admit: 2024-01-08 | Discharge: 2024-01-08 | Disposition: A | Discharge status: Home / Self Care | Source: Ambulatory Visit | Attending: Family Medicine | Admitting: Family Medicine

## 2024-01-08 VITALS — BP 128/60 | HR 106 | Wt 145.6 lb

## 2024-01-08 DIAGNOSIS — I130 Hypertensive heart and chronic kidney disease with heart failure and stage 1 through stage 4 chronic kidney disease, or unspecified chronic kidney disease: Secondary | ICD-10-CM | POA: Diagnosis not present

## 2024-01-08 DIAGNOSIS — E1122 Type 2 diabetes mellitus with diabetic chronic kidney disease: Secondary | ICD-10-CM | POA: Diagnosis not present

## 2024-01-08 DIAGNOSIS — G4733 Obstructive sleep apnea (adult) (pediatric): Secondary | ICD-10-CM | POA: Diagnosis not present

## 2024-01-08 DIAGNOSIS — R000 Tachycardia, unspecified: Secondary | ICD-10-CM | POA: Diagnosis not present

## 2024-01-08 DIAGNOSIS — I10 Essential (primary) hypertension: Secondary | ICD-10-CM

## 2024-01-08 DIAGNOSIS — Z794 Long term (current) use of insulin: Secondary | ICD-10-CM | POA: Insufficient documentation

## 2024-01-08 DIAGNOSIS — I428 Other cardiomyopathies: Secondary | ICD-10-CM | POA: Diagnosis not present

## 2024-01-08 DIAGNOSIS — E781 Pure hyperglyceridemia: Secondary | ICD-10-CM | POA: Insufficient documentation

## 2024-01-08 DIAGNOSIS — Z8719 Personal history of other diseases of the digestive system: Secondary | ICD-10-CM | POA: Insufficient documentation

## 2024-01-08 DIAGNOSIS — Z8249 Family history of ischemic heart disease and other diseases of the circulatory system: Secondary | ICD-10-CM | POA: Insufficient documentation

## 2024-01-08 DIAGNOSIS — I272 Pulmonary hypertension, unspecified: Secondary | ICD-10-CM | POA: Insufficient documentation

## 2024-01-08 DIAGNOSIS — Q228 Other congenital malformations of tricuspid valve: Secondary | ICD-10-CM

## 2024-01-08 DIAGNOSIS — E138 Other specified diabetes mellitus with unspecified complications: Secondary | ICD-10-CM

## 2024-01-08 DIAGNOSIS — I5022 Chronic systolic (congestive) heart failure: Secondary | ICD-10-CM | POA: Insufficient documentation

## 2024-01-08 DIAGNOSIS — I2510 Atherosclerotic heart disease of native coronary artery without angina pectoris: Secondary | ICD-10-CM | POA: Diagnosis not present

## 2024-01-08 DIAGNOSIS — R1900 Intra-abdominal and pelvic swelling, mass and lump, unspecified site: Secondary | ICD-10-CM | POA: Insufficient documentation

## 2024-01-08 DIAGNOSIS — I071 Rheumatic tricuspid insufficiency: Secondary | ICD-10-CM | POA: Insufficient documentation

## 2024-01-08 DIAGNOSIS — I5020 Unspecified systolic (congestive) heart failure: Secondary | ICD-10-CM | POA: Diagnosis not present

## 2024-01-08 DIAGNOSIS — Z79899 Other long term (current) drug therapy: Secondary | ICD-10-CM | POA: Insufficient documentation

## 2024-01-08 DIAGNOSIS — Z833 Family history of diabetes mellitus: Secondary | ICD-10-CM | POA: Insufficient documentation

## 2024-01-08 DIAGNOSIS — N184 Chronic kidney disease, stage 4 (severe): Secondary | ICD-10-CM | POA: Insufficient documentation

## 2024-01-08 DIAGNOSIS — Z139 Encounter for screening, unspecified: Secondary | ICD-10-CM

## 2024-01-08 LAB — BASIC METABOLIC PANEL WITH GFR
Anion gap: 13 (ref 5–15)
BUN: 77 mg/dL — ABNORMAL HIGH (ref 6–20)
CO2: 22 mmol/L (ref 22–32)
Calcium: 9.1 mg/dL (ref 8.9–10.3)
Chloride: 91 mmol/L — ABNORMAL LOW (ref 98–111)
Creatinine, Ser: 2.88 mg/dL — ABNORMAL HIGH (ref 0.44–1.00)
GFR, Estimated: 22 mL/min — ABNORMAL LOW (ref >60)
Glucose, Bld: 360 mg/dL — ABNORMAL HIGH (ref 70–99)
Potassium: 3.9 mmol/L (ref 3.5–5.1)
Sodium: 126 mmol/L — ABNORMAL LOW (ref 135–145)

## 2024-01-08 MED ORDER — IVABRADINE HCL 7.5 MG PO TABS: 7.5000 mg | ORAL_TABLET | Freq: Two times a day (BID) | ORAL | 11 refills | Status: AC

## 2024-01-08 NOTE — Progress Notes (Signed)
 Paramedicine Encounter   Patient ID: Jennifer [name] , female,   DOB: 1991/03/27,33 y.o.,  MRN: [id number]   Met patient in clinic today with provider.  Weight @ clinic-145 B/P-128/60 P-106 SP02-96 REDS CLIP-30%  Med changes-  She took extra 60mg  of torsemide  yesterday due to weight gain  She reports that her stomach was swollen.   She just feels weak, so everything gets her sob right now.  [name] having palpitations.  She can't tolerate the CPAP of any facial device.  She wants to do the inspire sleep surgery device.  No med changes.  She brought meds and pill box to clinic, went ahead and filled up pill box for this week.  Will come back if anything changes from labs.    Izetta Quivers, EMT-Paramedic [phone] 01/08/2024

## 2024-01-08 NOTE — Patient Instructions (Signed)
 There has been no changes to your medications.  Labs done today, your results will be available in MyChart, we will contact you for abnormal readings.  Your physician recommends that you schedule a follow-up appointment as scheduled.  If you have any questions or concerns before your next appointment please send Jennifer [name] a message through [place] or call our office at [phone].    TO LEAVE A MESSAGE FOR THE NURSE SELECT OPTION 2, PLEASE LEAVE A MESSAGE INCLUDING: YOUR NAME DATE OF BIRTH CALL BACK NUMBER REASON FOR CALL**this is important as we prioritize the call backs  YOU WILL RECEIVE A CALL BACK THE SAME DAY AS LONG AS YOU CALL BEFORE 4:00 PM  At the [facility], you and your health needs are our priority. As part of our continuing mission to provide you with exceptional heart care, we have created designated Provider Care Teams. These Care Teams include your primary Cardiologist (physician) and Advanced Practice Providers (APPs- Physician Assistants and Nurse Practitioners) who all work together to provide you with the care you need, when you need it.   You may see any of the following providers on your designated Care Team at your next follow up: Dr Arvilla Meres Dr Marca Ancona Dr. Dorthula Nettles Dr. Clearnce Hasten [name] Filbert Schilder, NP Robbie Lis, [place] [facility] [place], [place] Brynda Peon, NP [place] Lee, NP Clarisa Kindred, NP Karle Plumber, PharmD Enos Fling, PharmD   Please be sure to bring in all your medications bottles to every appointment.    Thank you for choosing [facility]

## 2024-01-08 NOTE — Progress Notes (Signed)
 Advanced Heart Failure Clinic Note   PCP: Emilio Joesph DEL, PA-C Primary Cardiologist: [name], MD HF Cardiologist: Dr. [name]    HPI: Jennifer [name] is a 33 y.o. 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   Has been followed by Dr. Bergamo for her HF.    Admitted 8/22 with vasculitis (no pancreatitis). Her TG were found to be 4030. Echo 11/28/20 EF 20-25% with moderate RV dysfunction and mild septal flattening. Started on DBA 2.5. cMRI c/w prior myocarditis vs sarcoid (see below). Mod pericardial effusion also noted. LVEF 29%. RV normal. She underwent R/LHC showing single vessel RCA occlusion, RA 1, LVEDP 17, Fick CO 5.6/CI 3.88. She was able to be weaned off inotropes and GDMT titrated.     Post [name] follow up she was hypervolemic, ReDs 43%. She followed up with Dr. Fredrica with [name] 9/22 for a 2nd opinion. He placed LifeVest and switched beta blocker to Toprol  XL.   Echo 10/22: EF up to 45-50%, grade II DD, RV ok. LifeVest off.   Treated in ED 10/22 for pancreatitis, then admitted 11/22 for hyperglycemia.   Echo 12/22 EF 45%, RV normal.    Follow up 5/23, weight up 16 lbs, ReDs 27%. Arranged for RHC to better assess hemodynamics and see if abdominal symptoms related to right-sided HF due to high-output state or non-cardiac. This showed elevated filling pressures and normal cardiac ouput. Mild to moderate mixed PH with prominent v-waves in RA, suggestive of significant TR. Given IV lasix  and metolazone  2.5 added weekly to diuretic regimen.    Admitted 5/23 with AKI and hyperkalemia. Given IVF, insulin , calcium  gluconate and Lokelma . Echo showed EF 45-50%, RV normal, mild-mod MR, RVSP 30. Only mild TR. Restarted on torsemide  40 bid (lower dose) and Entresto  49/51 bid. Arranged paramedicine,  discharged home 143 lbs.   Seen 7/23 at [facility] for 2nd opinion, felt to be congested and recommended increasing torsemide  to 60 mg bid and instructed f/u in 3 months.   Received Furoscix  8/4, 8/5, and 10/31/21.  Admitted 9/23 for volume overload in setting of RV failure and AKI. Required milrinone . Underwent Cardiomems implant. RHC showed RA 15, PA 37/23 (28), PCWP 16, Fick CO/CI 4.2/2.6, Thermo CO/CI 3.7/2.3, PVR 4.3, PA sat 51%, 54%, PAPi 0.93  Previously being followed by Dr. Kimble at [facility]. Had renal biopsy 10/23 which showed DM and HTN nephropathy. They discussed starting HD soon. Now being followed by [facility].   Echo 10/04/22 EF 55% RV ok. Mild AI   Continued to have multiple hospitalizations and ED visits for hyperglycemia, hyperTG (>5,000), uncontrolled HTN, and abdominal pain.   Admitted 9/25 for a/c CHF in setting of poor med compliance. Echo EF 45-50%, RV mildly reduced. She was diuresed w/ IV Lasix  gtt and metolazone , sCr bumped to >3.0. Underwent RHC which showed RA 6, PA 46/23 (mean 31), PCWP 13, FICK CI 1.8, TPG 18, PVR 6.4 WU, PAPi 3.8, severely elevated SVR 2857. Significant respiratory variation, but cardiomems correlates well with RHC numbers. Of note it was recommended, when evaluating cardiomems, would look at the PA tracings and use the end expiratory values when looking at [name]. GDMT titrated and she was re-enrolled in paramedicine. Discharged home, weight 139 lbs.  Admitted 12/28/23, sent from AHF clinic, for a/c dHF. She was diuresed with IV lasix  and discharged home, weight 145 lbs.  Today she returns for post [name], paramedic. Overall feeling OK. Feels weak, has SOB with everything. Abdomen swelling. Feels palpitations, unable to tolerate CPAP. Interested in [place] device. Denies abnormal bleeding, CP, dizziness, or PND/Orthopnea. Appetite ok. Weight at home 140 pounds. Taking all medications. Took extra 20 mg torsemide  yesterday and urinated  briskly. Asking to increase daily torsemide  dose.  ROS: All systems reviewed and negative except as per HPI.   Past Medical History:  Diagnosis Date   Afib (HCC) 05/12/2021   Brain tumor (HCC) 03/29/1995   astrocytoma   CHF (congestive heart failure) (HCC)    Cholesterosis    CKD (chronic kidney disease) stage 4, GFR 15-29 ml/min (HCC) 05/13/2021   DM (diabetes mellitus) (HCC) 10/10/2018   Fatty liver    Gastroparesis due to DM (HCC) 05/12/2021   HTN (hypertension) 10/10/2018   Hypothyroidism 10/10/2018   Lipoprotein deficiency    Lung disease    longevity long term   Pancreatitis    Polycystic ovary syndrome    Current Outpatient Medications  Medication Sig Dispense Refill   albuterol  (VENTOLIN  HFA) 108 (90 Base) MCG/ACT inhaler Inhale 2 puffs into the lungs daily as needed for wheezing or shortness of breath.     aspirin  EC 81 MG tablet Take 1 tablet (81 mg total) by mouth daily. Swallow whole. 30 tablet 1   atorvastatin  (LIPITOR) 80 MG tablet Take 1 tablet (80 mg total) by mouth every evening. 30 tablet 0   azelastine  (ASTELIN ) 0.1 % nasal spray Place 2 sprays into both nostrils daily as needed for rhinitis or allergies.     BAQSIMI ONE PACK 3 MG/DOSE POWD Place 1 Dose into the nose once as needed (hypoglycemia).     calcitRIOL  (ROCALTROL ) 0.25 MCG capsule Take 0.25 mcg by mouth daily.     dicyclomine  (BENTYL ) 20 MG tablet Take 1 tablet (20 mg total) by mouth 2 (two) times daily. 20 tablet 0   DULoxetine  (CYMBALTA ) 60 MG capsule Take 60 mg by mouth at bedtime.     ergocalciferol  (VITAMIN D2) 1.25 MG (50000 UT) capsule Take 1 capsule (50,000 Units total) by mouth every Sunday. 4 capsule 0   fenofibrate  54 MG tablet Take 1 tablet (54 mg total) by mouth daily. 30 tablet 0   Glucose Blood (BLOOD GLUCOSE TEST STRIPS) STRP 1 each by Does not apply route 3 (three) times daily. Use as directed to check blood sugar. May dispense any manufacturer covered by patient's insurance and fits  patient's device. 100 strip 0   HUMALOG  KWIKPEN 100 UNIT/ML KwikPen Inject 60 Units into the skin in the morning, at noon, and at bedtime.     HUMULIN  R U-500 KWIKPEN 500 UNIT/ML KwikPen Inject 300 Units into the skin in the morning and at bedtime.     hydrALAZINE  (APRESOLINE ) 100 MG tablet Take 1 tablet (100 mg total) by mouth every 8 (eight) hours. 90 tablet 0   Insulin  Pen Needle (PEN NEEDLES) 31G X 5 MM MISC Use as directed 3 (three) times daily. 100 each 0   isosorbide  mononitrate (IMDUR ) 60 MG 24 hr tablet Take 1 tablet (60 mg total) by mouth daily. 30 tablet 0   ivabradine  (CORLANOR ) 7.5 MG TABS tablet Take 1 tablet (7.5 mg total) by mouth 2 (two) times daily with a meal. 60 tablet 0   Lancet Device MISC 1 each by Does not apply route 3 (three) times daily. May dispense any manufacturer covered by patient's insurance. 1 each  0   Lancets MISC 1 each by Does not apply route 3 (three) times daily. Use as directed to check blood sugar. May dispense any manufacturer covered by patient's insurance and fits patient's device. 100 each 0   lansoprazole  (PREVACID ) 30 MG capsule Take 1 capsule (30 mg total) by mouth 2 (two) times daily before a meal. 60 capsule 2   levocetirizine (XYZAL ) 5 MG tablet Take 5 mg by mouth at bedtime.     levothyroxine  (SYNTHROID ) 150 MCG tablet Take 150 mcg by mouth daily before breakfast.     pregabalin  (LYRICA ) 300 MG capsule Take 300 mg by mouth at bedtime.     prochlorperazine  (COMPAZINE ) 10 MG tablet Take 10 mg by mouth daily as needed for nausea or vomiting.     promethazine  (PHENERGAN ) 25 MG tablet Take 25 mg by mouth at bedtime.     Safety Seal Miscellaneous MISC Hormonic Hair Solution with minoxidil USP 7% and finasteride USP 0.05% - apply to affected areas daily every morning. 30 mL 2   SUMAtriptan  (IMITREX ) 100 MG tablet Take 100 mg by mouth daily as needed.     torsemide  (DEMADEX ) 20 MG tablet Take 3 tablets (60 mg total) by mouth daily. 90 tablet 2   tretinoin   (RETIN-A ) 0.05 % cream Apply 1 Application topically at bedtime.     No current facility-administered medications for this encounter.   Allergies  Allergen Reactions   Icosapent  Ethyl (Epa Ethyl Ester) (Fish) Hives   Ketamine Other (See Comments)    In a vegetative state for 15 minutes, per pt   [place] Itching and Other (See Comments)    Itchy throat   Morphine  Hives, Itching, Rash and Other (See Comments)    Sore throat, too   Mushroom Other (See Comments)    Pt has never had mushrooms but tested positive on allergy test   Penicillins Hives, Itching and Rash    Tolerates cephalosporins   Shellfish Allergy Other (See Comments)    Pt has never had shellfish, but tested positive on allergy test. Pt states contrast in CT is okay.   Fentanyl  Nausea And Vomiting   Prednisone Rash   Social History   Socioeconomic History   Marital status: Divorced    Spouse name: Not on file   Number of children: 0   Years of education: Not on file   Highest education level: Not on file  Occupational History   Occupation: Dance movement psychotherapist   Occupation: N/A  Tobacco Use   Smoking status: Never   Smokeless tobacco: Never  Vaping Use   Vaping status: Never Used  Substance and Sexual Activity   Alcohol  use: Never   Drug use: Never   Sexual activity: Yes  Other Topics Concern   Not on file  Social History Narrative   Not on file   Social Drivers of Health   Financial Resource Strain: Low Risk  (10/24/2023)   Received from [facility]   Overall Financial Resource Strain (CARDIA)    How hard is it for you to pay for the very basics like food, housing, medical care, and heating?: Not very hard  Food Insecurity: No Food Insecurity (12/28/2023)   Hunger Vital Sign    Worried About Running Out of Food in the Last Year: Never true    Ran Out of Food in the Last Year: Never true  Transportation Needs: No Transportation Needs (12/28/2023)   PRAPARE - Administrator, Civil Service  (Medical): No  Lack of Transportation (Non-Medical): No  Physical Activity: Unknown (10/24/2023)   Received from [name]   Exercise Vital Sign    On average, how many days per week do you engage in moderate to strenuous exercise (like a brisk walk)?: Patient declined    Minutes of Exercise per Session: Not on file  Stress: Patient Declined (10/24/2023)   Received from [name] - Occupational Stress Questionnaire    Do you feel stress - tense, restless, nervous, or anxious, or unable to sleep at night because your mind is troubled all the time - these days?: Patient declined  Social Connections: Patient Declined (10/24/2023)   Received from [facility]   Social Network    How would you rate your social network (family, work, friends)?: Patient declined  Intimate Partner Violence: Not At Risk (12/28/2023)   Humiliation, Afraid, Rape, and Kick questionnaire    Fear of Current or Ex-Partner: No    Emotionally Abused: No    Physically Abused: No    Sexually Abused: No   Family History  Problem Relation Age of Onset   Diabetes Mother    Hypertension Mother    Hyperlipidemia Mother    Thyroid  disease Mother    Hypertension Father    Diabetes Father    Pancreatic cancer Paternal Aunt    Pancreatic cancer Paternal Uncle    Colon cancer Neg Hx    Esophageal cancer Neg Hx    Stomach cancer Neg Hx    Wt Readings from Last 3 Encounters:  01/08/24 66 kg (145 lb 9.6 oz)  01/04/24 65 kg (143 lb 6.4 oz)  01/02/24 64.4 kg (141 lb 15.6 oz)   BP 128/60   Pulse (!) 106   Wt 66 kg (145 lb 9.6 oz)   SpO2 96%   BMI 31.51 kg/m   Physical Exam  General:  NAD. No resp difficulty, walked into clinic, chronically-ill appearing HEENT: + eye ptosis Neck: Supple. No JVD. Thick neck Cor: Tachy regular rate & rhythm. No rubs, gallops or murmurs. Lungs: Clear Abdomen: Soft, nontender, + distended.  Extremities: No cyanosis, clubbing, rash,  edema Neuro: Alert & oriented x 3, moves all 4 extremities, + L hemiparesis, Affect pleasant.  ECG (personally reviewed): ST 103 bpm  Cardiomems (personally reviewed): PAD 21, (goal 24)  ReDs reading: 30%, normal   ASSESSMENT & PLAN: Nonischemic cardiomyopathy, recovered EF, previously with HFrEF (EF 29%) - prev reduced EF to 29%.  - Now stable EF 45-55% since 2022. With fluctuant RV function. - Echo 9/25: EF 45-50%, RV mildly reduced - RHC 9/25: RA 6, PA 46/23 (mean 31), PCWP 13, FICK CI 1.8, TPG 18, PVR 6.4 WU, PAPi 3.8, severely elevated SVR 2857 - NYHA III today, functional class confounded by physical deconditioning. Volume OK on exam, by ReDs 30%, and Cardiomems is below goal - GDMT limited by CKD - Continue torsemide  60 mg daily, OK to take extra 20 mg PRN - Continue hydralazine  100 mg tid + Imdur  60 mg daily - Continue ivabradine  7.5 mg bid. - No beta blocker with low CI on RHC - Labs today.  2. CAD - Single vessel RCA occlusion (small co-dominant vessel) on cardiac cath 09/08. - No chest pain - Continue medical management   3.  CKD IV - Scr baseline variable with frequent AKIs  - Previously followed by Dr. Kimble ([facility]rology at [name]) - Had renal biopsy (10/23) and showed DM and HTN nephropathy.  - Now  followed by [facility] - Baseline SCr now ~ 3 - BMET today.   4. Pulmonary hypertension - Mild to moderate on RHC 5/23. PVR 3.7 WU. - RHC 9/23: RA 15, PA 37/23 (28), PCWP 16,  Thermo CO/CI 3.7/2.3, PVR 4.3, PA sat 51%, 54%, PAPi 0.93  - RHC 9/25 RA 6, PA 46/23 (mean 31), PCWP 13, FICK CI 1.8, TPG 18, PVR 6.4 WU, PAPi 3.8, severely elevated SVR 2857 - Echo 9/25, RV mildly reduced  - V/Q negative - Off sildenafil  d/t dizziness and low BP   5. TR - Prominent v-waves in RA tracing on RHC 5/23. - prev stable mild/mod - Trivial on echo 9/23  - Mild on echo 9/25  6. Uncontrolled HTN - BP well-controlled today - Meds as above   7. OSA - Unable to tolerate CPAP mask -  Follows with [facility]  8. Severe hyperTG - Due to LPL deficiency w/ recent pancreatitis. - Follows in [facility] (Dr. Mona)  - Continue fenofibrate  + atorvastatin   9. Type 3c DM - 2/2 chronic pancreatitis  - Follows with [facility] - On insulin   10. Chronic abdominal swelling/bloating - Seen at [facility] (11/20) for IBS, per chart review. - Had penumatosis intestinalis. She was started on pancreatic enzyme replacement. - Has DM gastroparesis - Suspect primarily fatty liver  11. Poor Med Compliance/SDOH - Doing better with paramedicine back on board - Appreciate their assistance  Looks good today! Appreciate paramedicine's help  Keep follow up in 2 months with Dr. [name], as scheduled.  Harlene CHRISTELLA Gainer, FNP  2:47 PM

## 2024-01-08 NOTE — Progress Notes (Signed)
 ReDS Vest / Clip - 01/08/24 1400       ReDS Vest / Clip   Station Marker B    Ruler Value 32    ReDS Value Range Low volume    ReDS Actual Value 30

## 2024-01-11 NOTE — Telephone Encounter (Signed)
 [employer] approved

## 2024-01-15 ENCOUNTER — Other Ambulatory Visit (HOSPITAL_COMMUNITY): Payer: Self-pay

## 2024-01-15 ENCOUNTER — Ambulatory Visit (INDEPENDENT_AMBULATORY_CARE_PROVIDER_SITE_OTHER)

## 2024-01-15 ENCOUNTER — Ambulatory Visit (INDEPENDENT_AMBULATORY_CARE_PROVIDER_SITE_OTHER): Admitting: Podiatry

## 2024-01-15 DIAGNOSIS — M65272 Calcific tendinitis, left ankle and foot: Secondary | ICD-10-CM | POA: Diagnosis not present

## 2024-01-15 DIAGNOSIS — R609 Edema, unspecified: Secondary | ICD-10-CM

## 2024-01-15 DIAGNOSIS — M109 Gout, unspecified: Secondary | ICD-10-CM

## 2024-01-15 DIAGNOSIS — M659 Unspecified synovitis and tenosynovitis, unspecified site: Secondary | ICD-10-CM

## 2024-01-15 MED ORDER — TRIAMCINOLONE ACETONIDE 10 MG/ML IJ SUSP
10.0000 mg | Freq: Once | INTRAMUSCULAR | Status: AC
  Administered 2024-01-15: 10 mg via INTRA_ARTICULAR

## 2024-01-15 NOTE — Addendum Note (Signed)
 Encounter addended by: Dante Jeannine HERO, CMA on: 01/15/2024 2:05 PM  Actions taken: Charge Capture section accepted

## 2024-01-15 NOTE — Patient Instructions (Signed)
 Gout  Gout is painful swelling of your joints. Gout is a type of arthritis. It is caused by having too much uric acid in your body. Uric acid is a chemical that is made when your body breaks down substances called purines. If your body has too much uric acid, sharp crystals can form and build up in your joints. This causes pain and swelling. Gout attacks can happen quickly and be very painful (acute gout). Over time, the attacks can affect more joints and happen more often (chronic gout). What are the causes? Gout is caused by too much uric acid in your blood. This can happen because: Your kidneys do not remove enough uric acid from your blood. Your body makes too much uric acid. You eat too many foods that are high in purines. These foods include organ meats, some seafood, and beer. Trauma or stress can bring on an attack. What increases the risk? Having a family history of gout. Being female and middle-aged. Being female and having gone through menopause. Having an organ transplant. Taking certain medicines. Having certain conditions, such as: Being very overweight (obese). Lead poisoning. Kidney disease. A skin condition called psoriasis. Other risks include: Losing weight too quickly. Not having enough water in the body (being dehydrated). Drinking alcohol, especially beer. Drinking beverages that are sweetened with a type of sugar called fructose. What are the signs or symptoms? An attack of acute gout often starts at night and usually happens in just one joint. The most common place is the big toe. Other joints that may be affected include joints of the feet, ankle, knee, fingers, wrist, or elbow. Symptoms may include: Very bad pain. Warmth. Swelling. Stiffness. Tenderness. The affected joint may be very painful to touch. Shiny, red, or purple skin. Chills and fever. Chronic gout may cause symptoms more often. More joints may be involved. You may also have white or yellow lumps  (tophi) on your hands or feet or in other areas near your joints. How is this treated? Treatment for an acute attack may include medicines for pain and swelling, such as: NSAIDs, such as ibuprofen. Steroids taken by mouth or injected into a joint. Colchicine. This can be given by mouth or through an IV tube. Treatment to prevent future attacks may include: Taking small doses of NSAIDs or colchicine daily. Using a medicine that reduces uric acid levels in your blood, such as allopurinol. Making changes to your diet. You may need to see a food expert (dietitian) about what to eat and drink to prevent gout. Follow these instructions at home: During a gout attack  If told, put ice on the painful area. To do this: Put ice in a plastic bag. Place a towel between your skin and the bag. Leave the ice on for 20 minutes, 2-3 times a day. Take off the ice if your skin turns bright red. This is very important. If you cannot feel pain, heat, or cold, you have a greater risk of damage to the area. Raise the painful joint above the level of your heart as often as you can. Rest the joint as much as possible. If the joint is in your leg, you may be given crutches. Follow instructions from your doctor about what you cannot eat or drink. Avoiding future gout attacks Eat a low-purine diet. Avoid foods and drinks such as: Liver. Kidney. Anchovies. Asparagus. Herring. Mushrooms. Mussels. Beer. Stay at a healthy weight. If you want to lose weight, talk with your doctor. Do not  lose weight too fast. Start or continue an exercise plan as told by your doctor. Eating and drinking Avoid drinks sweetened by fructose. Drink enough fluids to keep your pee (urine) pale yellow. If you drink alcohol: Limit how much you have to: 0-1 drink a day for women who are not pregnant. 0-2 drinks a day for men. Know how much alcohol is in a drink. In the [place] one drink equals one 12 oz bottle of beer (355 mL), one 5 oz  glass of wine (148 mL), or one 1 oz glass of hard liquor (44 mL). General instructions Take over-the-counter and prescription medicines only as told by your doctor. Ask your doctor if you should avoid driving or using machines while you are taking your medicine. Return to your normal activities when your doctor says that it is safe. Keep all follow-up visits. Where to find more information [employer]: www.niams.http://www.myers.net/ Contact a doctor if: You have another gout attack. You still have symptoms of a gout attack after 10 days of treatment. You have problems (side effects) because of your medicines. You have chills or a fever. You have burning pain when you pee (urinate). You have pain in your lower back or belly. Get help right away if: You have very bad pain. Your pain cannot be controlled. You cannot pee. Summary Gout is painful swelling of the joints. The most common site of pain is the big toe, but it can affect other joints. Medicines and avoiding some foods can help to prevent and treat gout attacks. This information is not intended to replace advice given to you by your health care provider. Make sure you discuss any questions you have with your health care provider. Document Revised: 12/16/2020 Document Reviewed: 12/16/2020 Elsevier Patient Education  2024 [employer].

## 2024-01-15 NOTE — Progress Notes (Signed)
 Paramedicine Encounter    Patient ID: Delon Velia Freestone, female    DOB: 10-02-1990, 33 y.o.   MRN: [id number]   Complaints-tired-has gout-foot pain-big toe on rt foot   Edema-none  Compliance with meds-yes-no missed doses in pill box   Pill box filled-yes If so, by whom-paramedic   Refills needed-n/a   Pt reports she is doing ok. She denies c/p, no increased sob. No dizziness.  She does report still having palpitations. Occurs anytime-at rest and during exertion.   Appears she is taking all meds.  She has not been on her cardiomems this wknd.  Reminded her again to try to do that everyday.   Meds verified and pill box refilled.  HR and B/P elevated-she does report taking all her meds this morning.  She last took dose of her meds at 12.  Denies c/p.     BP (!) 142/78   Pulse (!) 112   Resp 15   Wt 140 lb (63.5 kg)   SpO2 98%   BMI 30.30 kg/m  Weight yesterday-140 Last visit weight-145 @ clinic   Patient Care Team: [name] as PCP - General (Physician Assistant) Tommas Pears, MD as PCP - Endocrinology (Endocrinology) Ladona Heinz, MD as PCP - Cardiology (Cardiology)  Patient Active Problem List   Diagnosis Date Noted  . Skin infection 01/02/2024  . 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) (HCC) 12/28/2023  . Hypertensive urgency 12/28/2023  . Acute exacerbation of CHF (congestive heart failure) (HCC) 12/07/2023  . Controlled type 2 diabetes mellitus with hyperglycemia (HCC) 08/31/2023  . Nausea without vomiting 08/31/2023  . Epigastric pain 08/31/2023  . Sleep apnea 08/30/2023  . Abscess 08/30/2023  . SOB (shortness of breath) 08/20/2023  . Dizziness 08/20/2023  . Chronic pancreatitis (HCC) 08/15/2023  . Witnessed episode of apnea 06/27/2023  . Pseudohyponatremia 04/10/2023  . Metabolic acidosis 04/10/2023  . 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 Peripheral neuropathy 04/10/2023  . Type 2 MI (myocardial  infarction) (HCC) 01/19/2023  . Atypical chest pain 01/19/2023  . Coronary artery disease involving native coronary artery of native heart without angina pectoris 01/18/2023  . Chest pain due to GERD 01/18/2023  . Enteritis 01/02/2023  . Syncope 01/02/2023  . LFT elevation 01/02/2023  . Bladder wall thickening 01/02/2023  . Hyperglycemia in setting of T2DM 09/19/2022  . Hyperbilirubinemia 09/07/2022  . UTI (urinary tract infection) due to Enterococcus 06/01/2022  . E. coli UTI (urinary tract infection) 06/01/2022  . DKA (diabetic ketoacidosis) (HCC) 06/01/2022  . ASCUS of cervix with negative high risk HPV 05/31/2022  . Acute on chronic pancreatitis (HCC) 05/29/2022  . Hypokalemia 03/23/2022  . COVID 03/15/2022  . COVID-19 virus infection 03/14/2022  . Hyponatremia 03/14/2022  . Positive D dimer 03/14/2022  . Stage 3b chronic kidney disease (CKD) (HCC) 03/14/2022  . Pulmonary hypertension (HCC) 02/06/2022  . NASH (nonalcoholic steatohepatitis) 02/06/2022  . Obesity (BMI 30-39.9) 02/06/2022  . Heart failure (HCC) 12/03/2021  . Acute on chronic diastolic CHF (congestive heart failure) (HCC) 12/02/2021  . Elevated troponin 12/02/2021  . Pancytopenia (HCC) 12/02/2021  . Amenorrhea 12/02/2021  . Hepatic steatosis 09/27/2021  . Hyperosmolar hyperglycemic state (HHS) (HCC) 08/26/2021  . Small intestinal bacterial overgrowth (SIBO) 08/26/2021  . Lower extremity edema 08/26/2021  . Hyperkalemia 08/14/2021  . Hx of insulin  dependent diabetes mellitus 08/14/2021  . CKD stage 3b, GFR 30-44 ml/min (HCC) 05/13/2021  . Gastroparesis due to DM (HCC) 05/12/2021  . Abdominal distension 05/12/2021  .  Chest pain 03/10/2021  . Acute on chronic combined systolic and diastolic CHF (congestive heart failure) (HCC) 02/09/2021  . History of astrocytoma of brain 02/09/2021  . Type 2 diabetes mellitus with hyperglycemia, with long-term current use of insulin  (HCC) 01/25/2021  . Diarrhea   . Elevated  transaminase level   . Orthostatic hypotension 11/28/2020  . Acute combined systolic and diastolic heart failure (HCC)   . Nonischemic cardiomyopathy (HCC)   . Acute systolic congestive heart failure (HCC)   . Elevated liver enzymes   . Acute renal failure superimposed on stage 3b chronic kidney disease (HCC) 11/26/2020  . Intractable abdominal pain 11/23/2020  . Chronic systolic congestive heart failure (HCC) 11/23/2020  . Essential hypertension 11/23/2020  . Familial hypertriglyceridemia 11/23/2020  . Prolonged QT interval 11/23/2020  . Finger mass, left 08/12/2020  . Nausea and vomiting 07/23/2020  . Polycystic ovary syndrome 01/30/2020  . Moderate aortic insufficiency 08/16/2019  . Moderate mitral regurgitation 08/16/2019  . Lipoprotein lipase deficiency, familial 06/19/2019  . Microalbuminuria due to type 1 diabetes mellitus (HCC) 06/19/2019  . Type 2 diabetes mellitus with hyperlipidemia (HCC) 10/10/2018  . Hypothyroidism 10/10/2018    Current Outpatient Medications:  .  aspirin  EC 81 MG tablet, Take 1 tablet (81 mg total) by mouth daily. Swallow whole., Disp: 30 tablet, Rfl: 1 .  atorvastatin  (LIPITOR) 80 MG tablet, Take 1 tablet (80 mg total) by mouth every evening., Disp: 30 tablet, Rfl: 0 .  azelastine  (ASTELIN ) 0.1 % nasal spray, Place 2 sprays into both nostrils daily as needed for rhinitis or allergies., Disp: , Rfl:  .  calcitRIOL  (ROCALTROL ) 0.25 MCG capsule, Take 0.25 mcg by mouth daily., Disp: , Rfl:  .  DULoxetine  (CYMBALTA ) 60 MG capsule, Take 60 mg by mouth at bedtime., Disp: , Rfl:  .  ergocalciferol  (VITAMIN D2) 1.25 MG (50000 UT) capsule, Take 1 capsule (50,000 Units total) by mouth every Sunday., Disp: 4 capsule, Rfl: 0 .  fenofibrate  54 MG tablet, Take 1 tablet (54 mg total) by mouth daily., Disp: 30 tablet, Rfl: 0 .  HUMALOG  KWIKPEN 100 UNIT/ML KwikPen, Inject 60 Units into the skin in the morning, at noon, and at bedtime., Disp: , Rfl:  .  hydrALAZINE   (APRESOLINE ) 100 MG tablet, Take 1 tablet (100 mg total) by mouth every 8 (eight) hours., Disp: 90 tablet, Rfl: 0 .  Insulin  Pen Needle (PEN NEEDLES) 31G X 5 MM MISC, Use as directed 3 (three) times daily., Disp: 100 each, Rfl: 0 .  isosorbide  mononitrate (IMDUR ) 60 MG 24 hr tablet, Take 1 tablet (60 mg total) by mouth daily., Disp: 30 tablet, Rfl: 0 .  ivabradine  (CORLANOR ) 7.5 MG TABS tablet, Take 1 tablet (7.5 mg total) by mouth 2 (two) times daily with a meal., Disp: 60 tablet, Rfl: 11 .  lansoprazole  (PREVACID ) 30 MG capsule, Take 1 capsule (30 mg total) by mouth 2 (two) times daily before a meal., Disp: 60 capsule, Rfl: 2 .  levocetirizine (XYZAL ) 5 MG tablet, Take 5 mg by mouth at bedtime., Disp: , Rfl:  .  levothyroxine  (SYNTHROID ) 150 MCG tablet, Take 150 mcg by mouth daily before breakfast., Disp: , Rfl:  .  pregabalin  (LYRICA ) 300 MG capsule, Take 300 mg by mouth at bedtime., Disp: , Rfl:  .  promethazine  (PHENERGAN ) 25 MG tablet, Take 25 mg by mouth at bedtime., Disp: , Rfl:  .  torsemide  (DEMADEX ) 20 MG tablet, Take 3 tablets (60 mg total) by mouth daily., Disp: 90 tablet, Rfl:  2 .  albuterol  (VENTOLIN  HFA) 108 (90 Base) MCG/ACT inhaler, Inhale 2 puffs into the lungs daily as needed for wheezing or shortness of breath., Disp: , Rfl:  .  BAQSIMI ONE PACK 3 MG/DOSE POWD, Place 1 Dose into the nose once as needed (hypoglycemia)., Disp: , Rfl:  .  dicyclomine  (BENTYL ) 20 MG tablet, Take 1 tablet (20 mg total) by mouth 2 (two) times daily., Disp: 20 tablet, Rfl: 0 .  Glucose Blood (BLOOD GLUCOSE TEST STRIPS) STRP, 1 each by Does not apply route 3 (three) times daily. Use as directed to check blood sugar. May dispense any manufacturer covered by patient's insurance and fits patient's device., Disp: 100 strip, Rfl: 0 .  HUMULIN  R U-500 KWIKPEN 500 UNIT/ML KwikPen, Inject 300 Units into the skin in the morning and at bedtime., Disp: , Rfl:  .  Lancet Device MISC, 1 each by Does not apply route 3  (three) times daily. May dispense any manufacturer covered by patient's insurance., Disp: 1 each, Rfl: 0 .  Lancets MISC, 1 each by Does not apply route 3 (three) times daily. Use as directed to check blood sugar. May dispense any manufacturer covered by patient's insurance and fits patient's device., Disp: 100 each, Rfl: 0 .  prochlorperazine  (COMPAZINE ) 10 MG tablet, Take 10 mg by mouth daily as needed for nausea or vomiting., Disp: , Rfl:  .  Safety Seal Miscellaneous MISC, Hormonic Hair Solution with minoxidil USP 7% and finasteride USP 0.05% - apply to affected areas daily every morning., Disp: 30 mL, Rfl: 2 .  SUMAtriptan  (IMITREX ) 100 MG tablet, Take 100 mg by mouth daily as needed., Disp: , Rfl:  .  tretinoin  (RETIN-A ) 0.05 % cream, Apply 1 Application topically at bedtime., Disp: , Rfl:  Allergies  Allergen Reactions  . Icosapent  Ethyl (Epa Ethyl Ester) (Fish) Hives  . Ketamine Other (See Comments)    In a vegetative state for 15 minutes, per pt  . [place] Itching and Other (See Comments)    Itchy throat  . Morphine  Hives, Itching, Rash and Other (See Comments)    Sore throat, too  . Mushroom Other (See Comments)    Pt has never had mushrooms but tested positive on allergy test  . Penicillins Hives, Itching and Rash    Tolerates cephalosporins  . Shellfish Allergy Other (See Comments)    Pt has never had shellfish, but tested positive on allergy test. Pt states contrast in CT is okay.  . Fentanyl  Nausea And Vomiting  . Prednisone Rash      Social History   Socioeconomic History  . Marital status: Divorced    Spouse name: Not on file  . Number of children: 0  . Years of education: Not on file  . Highest education level: Not on file  Occupational History  . Occupation: [employer]  . Occupation: N/A  Tobacco Use  . Smoking status: Never  . Smokeless tobacco: Never  Vaping Use  . Vaping status: Never Used  Substance and Sexual Activity  . Alcohol  use: Never  .  Drug use: Never  . Sexual activity: Yes  Other Topics Concern  . Not on file  Social History Narrative  . Not on file   Social Drivers of Health   Financial Resource Strain: Low Risk  (10/24/2023)   Received from [facility]   Overall Financial Resource Strain (CARDIA)   . How hard is it for you to pay for the very basics like food, housing, medical care,  and heating?: Not very hard  Food Insecurity: No Food Insecurity (12/28/2023)   Hunger Vital Sign   . Worried About Programme researcher, broadcasting/film/video in the Last Year: Never true   . Ran Out of Food in the Last Year: Never true  Transportation Needs: No Transportation Needs (12/28/2023)   PRAPARE - Transportation   . Lack of Transportation (Medical): No   . Lack of Transportation (Non-Medical): No  Physical Activity: Unknown (10/24/2023)   Received from [facility]   Exercise Vital Sign   . On average, how many days per week do you engage in moderate to strenuous exercise (like a brisk walk)?: Patient declined   . Minutes of Exercise per Session: Not on file  Stress: Patient Declined (10/24/2023)   Received from [facility] of Occupational [facility] - Occupational Stress Questionnaire   . Do you feel stress - tense, restless, nervous, or anxious, or unable to sleep at night because your mind is troubled all the time - these days?: Patient declined  Social Connections: Patient Declined (10/24/2023)   Received from [facility]   Social Network   . How would you rate your social network (family, work, friends)?: Patient declined  Intimate Partner Violence: Not At Risk (12/28/2023)   Humiliation, Afraid, Rape, and Kick questionnaire   . Fear of Current or Ex-Partner: No   . Emotionally Abused: No   . Physically Abused: No   . Sexually Abused: No    Physical Exam      Future Appointments  Date Time Provider Department Center  02/05/2024  9:30 AM Hope Almarie ORN, NP [facility] [address]  02/05/2024  2:00 PM  [facility] OP 1 [facility]  02/05/2024  3:00 PM [name], Toribio SAUNDERS, MD [facility] None  05/07/2024  3:15 PM Alm Delon SAILOR, DO [facility] None       Izetta Quivers, Paramedic [phone] [facility] Paramedic  01/15/24

## 2024-01-16 ENCOUNTER — Telehealth: Payer: Self-pay | Admitting: Internal Medicine

## 2024-01-16 NOTE — Telephone Encounter (Signed)
 Patient is calling in to see if she suppose to have a f/u after taking the medication. I advise patient per last office visit it say 6 months, she would like to do a f/u sooner. Please advise

## 2024-01-16 NOTE — Telephone Encounter (Signed)
 Please advise when next appropriate follow up is?

## 2024-01-16 NOTE — Telephone Encounter (Signed)
 Pt reports that she started [place] today, but oncology has expressed concerned about pt's platelet count. Pt wants to know if she needs labs sooner than 6 months.

## 2024-01-17 NOTE — Telephone Encounter (Signed)
 Mona Vinie BROCKS, MD to Me  (Selected Message)     01/12/24  5:55 PM Yes , lets recheck LFT's and CBC in 6-8 weeks (after 2 doses).   [place]

## 2024-01-17 NOTE — Telephone Encounter (Signed)
Labs ordered. Update sent to patient in MyChart

## 2024-01-17 NOTE — Addendum Note (Signed)
 Addended by: LORING ANDRIETTE HERO on: 01/17/2024 08:02 AM   Modules accepted: Orders

## 2024-01-17 NOTE — Progress Notes (Signed)
 Subjective:   Patient ID: Jennifer [name], female   DOB: 33 y.o.   MRN: [id number]   HPI Patient presents with pain in her big toe joint and pain in her ankle.  Stated this just started in the last couple days its been red and she has never experienced this before.  States is very sore in both areas but the big toe joint is the worst   ROS      Objective:  Physical Exam  Neurovascular status unchanged from previous visit with the patient who does have also nerve issues and other structural deformity.  Noted to have significant redness around the first MPJ left and also moderate around the ankle with the MPJ to be much worse and very painful when pressed     Assessment:  Appears to be an inflammatory condition consistent with gout with possibility for 2 areas of the best and inflammatory capsulitis first MPJ left     Plan:  H&P reviewed gout and discussed its origins and foods and she is going to watch and see if she can change this herself but may require blood work and ultimate medication.  I did do sterile prep I injected periarticular around the first MPJ 3 mg dexamethasone  Kenalog 5 mg Xylocaine  to reduce the inflammation  X-rays do not indicate fracture or extensive arthritis associated with this condition

## 2024-01-17 NOTE — Telephone Encounter (Signed)
 Lab test(s) ordered Update sent to patient in MyChart

## 2024-01-22 ENCOUNTER — Other Ambulatory Visit (HOSPITAL_COMMUNITY): Payer: Self-pay

## 2024-01-22 NOTE — Progress Notes (Signed)
 Paramedicine Encounter    Patient ID: Jennifer [name], female    DOB: [birth year], 33 y.o.   MRN: [id number]   Complaints-denies  Edema-none   Compliance with meds-yes   Pill box filled-yes  If so, by whom-pill box   Refills needed-vit d, fenofibrate -she called in these to pharmacy     Pt reports she is doing ok. She had c/p a couple times last week with palpitations. She described it as sharp c/p to the left side, it subsided on its own, she reports that if it lasted longer than she would have went to ER.  No sob, no n/v. No dizziness.  B/p elevated- she reports she is due for her next hydralazine  dose.  It appears she is taking all meds. Her pill box is empty.  She reports compliance with her cardiomems.  I only see yesterday and last Tuesday, both were at goal.  Meds verified and pill box refilled.   BP (!) 166/72   Pulse (!) 104   Resp 16   Wt 139 lb (63 kg)   SpO2 98%   BMI 30.08 kg/m  Weight yesterday-140 Last visit weight-140  Patient Care Team: [name] as PCP - General (Physician Assistant) Tommas Pears, MD as PCP - Endocrinology (Endocrinology) Ladona Heinz, MD as PCP - Cardiology (Cardiology)  Patient Active Problem List   Diagnosis Date Noted   Skin infection 01/02/2024   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) (HCC) 12/28/2023   Hypertensive urgency 12/28/2023   Acute exacerbation of CHF (congestive heart failure) (HCC) 12/07/2023   Controlled type 2 diabetes mellitus with hyperglycemia (HCC) 08/31/2023   Nausea without vomiting 08/31/2023   Epigastric pain 08/31/2023   Sleep apnea 08/30/2023   Abscess 08/30/2023   SOB (shortness of breath) 08/20/2023   Dizziness 08/20/2023   Chronic pancreatitis (HCC) 08/15/2023   Witnessed episode of apnea 06/27/2023   Pseudohyponatremia 04/10/2023   Metabolic acidosis 04/10/2023   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 coli UTI (urinary tract infection) 06/01/2022   DKA (diabetic ketoacidosis) (HCC) 06/01/2022   ASCUS of cervix with negative high risk HPV 05/31/2022   Acute on chronic pancreatitis (HCC) 05/29/2022   Hypokalemia 03/23/2022   COVID 03/15/2022   COVID-19 virus infection 03/14/2022   Hyponatremia 03/14/2022   Positive D dimer 03/14/2022   Stage 3b chronic kidney disease (CKD) (HCC) 03/14/2022   Pulmonary hypertension (HCC) 02/06/2022   NASH (nonalcoholic steatohepatitis) 02/06/2022   Obesity (BMI 30-39.9) 02/06/2022   Heart failure (HCC) 12/03/2021   Acute on chronic diastolic CHF (congestive heart failure) (HCC) 12/02/2021   Elevated troponin 12/02/2021   Pancytopenia (HCC) 12/02/2021   Amenorrhea 12/02/2021   Hepatic steatosis 09/27/2021   Hyperosmolar hyperglycemic state (HHS) (HCC) 08/26/2021   Small intestinal bacterial overgrowth (SIBO) 08/26/2021   Lower extremity edema 08/26/2021   Hyperkalemia 08/14/2021   Hx of insulin  dependent diabetes mellitus 08/14/2021   CKD stage 3b, GFR 30-44 ml/min (HCC) 05/13/2021   Gastroparesis  due to DM (HCC) 05/12/2021   Abdominal distension 05/12/2021   Chest pain 03/10/2021   Acute on chronic combined systolic and diastolic CHF (congestive heart failure) (HCC) 02/09/2021   History of astrocytoma of brain 02/09/2021   Type 2 diabetes mellitus with hyperglycemia, with long-term current use of insulin  (HCC) 01/25/2021   Diarrhea    Elevated transaminase level     Orthostatic hypotension 11/28/2020   Acute combined systolic and diastolic heart failure (HCC)    Nonischemic cardiomyopathy (HCC)    Acute systolic congestive heart failure (HCC)    Elevated liver enzymes    Acute renal failure superimposed on stage 3b chronic kidney disease (HCC) 11/26/2020   Intractable abdominal pain 11/23/2020   Chronic systolic congestive heart failure (HCC) 11/23/2020   Essential hypertension 11/23/2020   Familial hypertriglyceridemia 11/23/2020   Prolonged QT interval 11/23/2020   Finger mass, left 08/12/2020   Nausea and vomiting 07/23/2020   Polycystic ovary syndrome 01/30/2020   Moderate aortic insufficiency 08/16/2019   Moderate mitral regurgitation 08/16/2019   Lipoprotein lipase deficiency, familial 06/19/2019   Microalbuminuria due to type 1 diabetes mellitus (HCC) 06/19/2019   Type 2 diabetes mellitus with hyperlipidemia (HCC) 10/10/2018   Hypothyroidism 10/10/2018    Current Outpatient Medications:    albuterol  (VENTOLIN  HFA) 108 (90 Base) MCG/ACT inhaler, Inhale 2 puffs into the lungs daily as needed for wheezing or shortness of breath., Disp: , Rfl:    aspirin  EC 81 MG tablet, Take 1 tablet (81 mg total) by mouth daily. Swallow whole., Disp: 30 tablet, Rfl: 1   atorvastatin  (LIPITOR) 80 MG tablet, Take 1 tablet (80 mg total) by mouth every evening., Disp: 30 tablet, Rfl: 0   azelastine  (ASTELIN ) 0.1 % nasal spray, Place 2 sprays into both nostrils daily as needed for rhinitis or allergies., Disp: , Rfl:    BAQSIMI ONE PACK 3 MG/DOSE POWD, Place 1 Dose into the nose once as needed (hypoglycemia)., Disp: , Rfl:    calcitRIOL  (ROCALTROL ) 0.25 MCG capsule, Take 0.25 mcg by mouth daily., Disp: , Rfl:    dicyclomine  (BENTYL ) 20 MG tablet, Take 1 tablet (20 mg total) by mouth 2 (two) times daily., Disp: 20 tablet, Rfl: 0   DULoxetine  (CYMBALTA ) 60 MG capsule, Take 60 mg by mouth at bedtime., Disp: , Rfl:    ergocalciferol  (VITAMIN D2) 1.25 MG (50000 UT)  capsule, Take 1 capsule (50,000 Units total) by mouth every Sunday., Disp: 4 capsule, Rfl: 0   fenofibrate  54 MG tablet, Take 1 tablet (54 mg total) by mouth daily., Disp: 30 tablet, Rfl: 0   Glucose Blood (BLOOD GLUCOSE TEST STRIPS) STRP, 1 each by Does not apply route 3 (three) times daily. Use as directed to check blood sugar. May dispense any manufacturer covered by patient's insurance and fits patient's device., Disp: 100 strip, Rfl: 0   HUMALOG  KWIKPEN 100 UNIT/ML KwikPen, Inject 60 Units into the skin in the morning, at noon, and at bedtime., Disp: , Rfl:    HUMULIN  R U-500 KWIKPEN 500 UNIT/ML KwikPen, Inject 300 Units into the skin in the morning and at bedtime., Disp: , Rfl:    hydrALAZINE  (APRESOLINE ) 100 MG tablet, Take 1 tablet (100 mg total) by mouth every 8 (eight) hours., Disp: 90 tablet, Rfl: 0   Insulin  Pen Needle (PEN NEEDLES) 31G X 5 MM MISC, Use as directed 3 (three) times daily., Disp: 100 each, Rfl: 0   isosorbide  mononitrate (IMDUR ) 60 MG 24 hr tablet, Take 1 tablet (60 mg total)  by mouth daily., Disp: 30 tablet, Rfl: 0   ivabradine  (CORLANOR ) 7.5 MG TABS tablet, Take 1 tablet (7.5 mg total) by mouth 2 (two) times daily with a meal., Disp: 60 tablet, Rfl: 11   Lancet Device MISC, 1 each by Does not apply route 3 (three) times daily. May dispense any manufacturer covered by patient's insurance., Disp: 1 each, Rfl: 0   Lancets MISC, 1 each by Does not apply route 3 (three) times daily. Use as directed to check blood sugar. May dispense any manufacturer covered by patient's insurance and fits patient's device., Disp: 100 each, Rfl: 0   lansoprazole  (PREVACID ) 30 MG capsule, Take 1 capsule (30 mg total) by mouth 2 (two) times daily before a meal., Disp: 60 capsule, Rfl: 2   levocetirizine (XYZAL ) 5 MG tablet, Take 5 mg by mouth at bedtime., Disp: , Rfl:    levothyroxine  (SYNTHROID ) 150 MCG tablet, Take 150 mcg by mouth daily before breakfast., Disp: , Rfl:    pregabalin  (LYRICA ) 300  MG capsule, Take 300 mg by mouth at bedtime., Disp: , Rfl:    prochlorperazine  (COMPAZINE ) 10 MG tablet, Take 10 mg by mouth daily as needed for nausea or vomiting., Disp: , Rfl:    promethazine  (PHENERGAN ) 25 MG tablet, Take 25 mg by mouth at bedtime., Disp: , Rfl:    Safety Seal Miscellaneous MISC, Hormonic Hair Solution with minoxidil USP 7% and finasteride USP 0.05% - apply to affected areas daily every morning., Disp: 30 mL, Rfl: 2   SUMAtriptan  (IMITREX ) 100 MG tablet, Take 100 mg by mouth daily as needed., Disp: , Rfl:    torsemide  (DEMADEX ) 20 MG tablet, Take 3 tablets (60 mg total) by mouth daily., Disp: 90 tablet, Rfl: 2   tretinoin  (RETIN-A ) 0.05 % cream, Apply 1 Application topically at bedtime., Disp: , Rfl:  Allergies  Allergen Reactions   Icosapent  Ethyl (Epa Ethyl Ester) (Fish) Hives   Ketamine Other (See Comments)    In a vegetative state for 15 minutes, per pt   Maitake Itching and Other (See Comments)    Itchy throat   Morphine  Hives, Itching, Rash and Other (See Comments)    Sore throat, too   Mushroom Other (See Comments)    Pt has never had mushrooms but tested positive on allergy test   Penicillins Hives, Itching and Rash    Tolerates cephalosporins   Shellfish Allergy Other (See Comments)    Pt has never had shellfish, but tested positive on allergy test. Pt states contrast in CT is okay.   Fentanyl  Nausea And Vomiting   Prednisone Rash      Social History   Socioeconomic History   Marital status: Divorced    Spouse name: Not on file   Number of children: 0   Years of education: Not on file   Highest education level: Not on file  Occupational History   Occupation: Dance Movement Psychotherapist   Occupation: N/A  Tobacco Use   Smoking status: Never   Smokeless tobacco: Never  Vaping Use   Vaping status: Never Used  Substance and Sexual Activity   Alcohol  use: Never   Drug use: Never   Sexual activity: Yes  Other Topics Concern   Not on file  Social History  Narrative   Not on file   Social Drivers of Health   Financial Resource Strain: Low Risk  (10/24/2023)   Received from [facility]   Overall Financial Resource Strain (CARDIA)    How hard is it for  you to pay for the very basics like food, housing, medical care, and heating?: Not very hard  Food Insecurity: No Food Insecurity (12/28/2023)   Hunger Vital Sign    Worried About Running Out of Food in the Last Year: Never true    Ran Out of Food in the Last Year: Never true  Transportation Needs: No Transportation Needs (12/28/2023)   PRAPARE - Administrator, Civil Service (Medical): No    Lack of Transportation (Non-Medical): No  Physical Activity: Unknown (10/24/2023)   Received from [facility]   Exercise Vital Sign    On average, how many days per week do you engage in moderate to strenuous exercise (like a brisk walk)?: Patient declined    Minutes of Exercise per Session: Not on file  Stress: Patient Declined (10/24/2023)   Received from [facility] - Occupational Stress Questionnaire    Do you feel stress - tense, restless, nervous, or anxious, or unable to sleep at night because your mind is troubled all the time - these days?: Patient declined  Social Connections: Patient Declined (10/24/2023)   Received from [facility]   Social Network    How would you rate your social network (family, work, friends)?: Patient declined  Intimate Partner Violence: Not At Risk (12/28/2023)   Humiliation, Afraid, Rape, and Kick questionnaire    Fear of Current or Ex-Partner: No    Emotionally Abused: No    Physically Abused: No    Sexually Abused: No    Physical Exam      Future Appointments  Date Time Provider Department Center  02/05/2024  9:30 AM Hope Almarie ORN, NP [facility] [address]  02/05/2024  2:00 PM [facility] OP 1 [facility]  02/05/2024  3:00 PM [name], Toribio SAUNDERS, MD [facility] None  05/07/2024  3:15 PM  Alm Jennifer SAILOR, DO [facility] None       Izetta Quivers, Paramedic [phone] [facility] Paramedic  01/22/24

## 2024-01-29 ENCOUNTER — Other Ambulatory Visit (HOSPITAL_COMMUNITY): Payer: Self-pay

## 2024-01-29 NOTE — Progress Notes (Signed)
 Paramedicine Encounter    Patient ID: Jennifer [name], female    DOB: [birth year], 33 y.o.   MRN: [id number]   Complaints-none  Edema-none   Compliance with meds-yes   Pill box filled-yes If so, by whom-paramedic   Refills needed-fenofibrate -asked pharm to fill and deliver   Pt reports she is doing ok she denies increased sob, no dizziness, no c/p, her palpitations are getting better an occur less often.  [name] sleeping good, using cardiomems regularly and has been at goal.  [name] getting appoint with neuro sooner due to headaches getting worse.  Her cardio appoint was cancelled next week due to provider not being in office.   Appetite is ok.  B/p elevated but just took her 2nd dose of hydralazine  when I arrived.   Meds verified and pill box refilled     CBG pta-84 BP (!) 158/80   Pulse 99   Resp 16   Wt 142 lb (64.4 kg)   SpO2 98%   BMI 30.73 kg/m  Weight yesterday-142 Last visit weight-139  Patient Care Team: Jennifer [name] as PCP - General (Physician Assistant) Jennifer Pears, MD as PCP - Endocrinology (Endocrinology) Ladona Heinz, MD as PCP - Cardiology (Cardiology)  Patient Active Problem List   Diagnosis Date Noted  . Skin infection 01/02/2024  . 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) (HCC) 12/28/2023  . Hypertensive urgency 12/28/2023  . Acute exacerbation of CHF (congestive heart failure) (HCC) 12/07/2023  . Controlled type 2 diabetes mellitus with hyperglycemia (HCC) 08/31/2023  . Nausea without vomiting 08/31/2023  . Epigastric pain 08/31/2023  . Sleep apnea 08/30/2023  . Abscess 08/30/2023  . SOB (shortness of breath) 08/20/2023  . Dizziness 08/20/2023  . Chronic pancreatitis (HCC) 08/15/2023  . Witnessed episode of apnea 06/27/2023  . Pseudohyponatremia 04/10/2023  . Metabolic acidosis 04/10/2023  . 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 Peripheral neuropathy 04/10/2023  . Type 2 MI (myocardial  infarction) (HCC) 01/19/2023  . Atypical chest pain 01/19/2023  . Coronary artery disease involving native coronary artery of native heart without angina pectoris 01/18/2023  . Chest pain due to GERD 01/18/2023  . Enteritis 01/02/2023  . Syncope 01/02/2023  . LFT elevation 01/02/2023  . Bladder wall thickening 01/02/2023  . Hyperglycemia in setting of T2DM 09/19/2022  . Hyperbilirubinemia 09/07/2022  . UTI (urinary tract infection) due to Enterococcus 06/01/2022  . E. coli UTI (urinary tract infection) 06/01/2022  . DKA (diabetic ketoacidosis) (HCC) 06/01/2022  . ASCUS of cervix with negative high risk HPV 05/31/2022  . Acute on chronic pancreatitis (HCC) 05/29/2022  . Hypokalemia 03/23/2022  . COVID 03/15/2022  . COVID-19 virus infection 03/14/2022  . Hyponatremia 03/14/2022  . Positive D dimer 03/14/2022  . Stage 3b chronic kidney disease (CKD) (HCC) 03/14/2022  . Pulmonary hypertension (HCC) 02/06/2022  . NASH (nonalcoholic steatohepatitis) 02/06/2022  . Obesity (BMI 30-39.9) 02/06/2022  . Heart failure (HCC) 12/03/2021  . Acute on chronic diastolic CHF (congestive heart failure) (HCC) 12/02/2021  . Elevated troponin 12/02/2021  . Pancytopenia (HCC) 12/02/2021  . Amenorrhea 12/02/2021  . Hepatic steatosis 09/27/2021  . Hyperosmolar hyperglycemic state (HHS) (HCC) 08/26/2021  . Small intestinal bacterial overgrowth (SIBO) 08/26/2021  . Lower extremity edema 08/26/2021  . Hyperkalemia 08/14/2021  . Hx of insulin  dependent diabetes mellitus 08/14/2021  . CKD stage 3b, GFR 30-44 ml/min (HCC) 05/13/2021  . Gastroparesis due to DM (HCC) 05/12/2021  . Abdominal distension 05/12/2021  . Chest pain 03/10/2021  .  Acute on chronic combined systolic and diastolic CHF (congestive heart failure) (HCC) 02/09/2021  . History of astrocytoma of brain 02/09/2021  . Type 2 diabetes mellitus with hyperglycemia, with long-term current use of insulin  (HCC) 01/25/2021  . Diarrhea   . Elevated  transaminase level   . Orthostatic hypotension 11/28/2020  . Acute combined systolic and diastolic heart failure (HCC)   . Nonischemic cardiomyopathy (HCC)   . Acute systolic congestive heart failure (HCC)   . Elevated liver enzymes   . Acute renal failure superimposed on stage 3b chronic kidney disease (HCC) 11/26/2020  . Intractable abdominal pain 11/23/2020  . Chronic systolic congestive heart failure (HCC) 11/23/2020  . Essential hypertension 11/23/2020  . Familial hypertriglyceridemia 11/23/2020  . Prolonged QT interval 11/23/2020  . Finger mass, left 08/12/2020  . Nausea and vomiting 07/23/2020  . Polycystic ovary syndrome 01/30/2020  . Moderate aortic insufficiency 08/16/2019  . Moderate mitral regurgitation 08/16/2019  . Lipoprotein lipase deficiency, familial 06/19/2019  . Microalbuminuria due to type 1 diabetes mellitus (HCC) 06/19/2019  . Type 2 diabetes mellitus with hyperlipidemia (HCC) 10/10/2018  . Hypothyroidism 10/10/2018    Current Outpatient Medications:  .  aspirin  EC 81 MG tablet, Take 1 tablet (81 mg total) by mouth daily. Swallow whole., Disp: 30 tablet, Rfl: 1 .  atorvastatin  (LIPITOR) 80 MG tablet, Take 1 tablet (80 mg total) by mouth every evening., Disp: 30 tablet, Rfl: 0 .  calcitRIOL  (ROCALTROL ) 0.25 MCG capsule, Take 0.25 mcg by mouth daily., Disp: , Rfl:  .  DULoxetine  (CYMBALTA ) 60 MG capsule, Take 60 mg by mouth at bedtime., Disp: , Rfl:  .  ergocalciferol  (VITAMIN D2) 1.25 MG (50000 UT) capsule, Take 1 capsule (50,000 Units total) by mouth every Sunday., Disp: 4 capsule, Rfl: 0 .  fenofibrate  54 MG tablet, Take 1 tablet (54 mg total) by mouth daily., Disp: 30 tablet, Rfl: 0 .  HUMALOG  KWIKPEN 100 UNIT/ML KwikPen, Inject 60 Units into the skin in the morning, at noon, and at bedtime. (Patient taking differently: Inject 60 Units into the skin in the morning, at noon, and at bedtime.), Disp: , Rfl:  .  HUMULIN  R U-500 KWIKPEN 500 UNIT/ML KwikPen, Inject  300 Units into the skin in the morning and at bedtime., Disp: , Rfl:  .  hydrALAZINE  (APRESOLINE ) 100 MG tablet, Take 1 tablet (100 mg total) by mouth every 8 (eight) hours., Disp: 90 tablet, Rfl: 0 .  Insulin  Pen Needle (PEN NEEDLES) 31G X 5 MM MISC, Use as directed 3 (three) times daily., Disp: 100 each, Rfl: 0 .  isosorbide  mononitrate (IMDUR ) 60 MG 24 hr tablet, Take 1 tablet (60 mg total) by mouth daily., Disp: 30 tablet, Rfl: 0 .  ivabradine  (CORLANOR ) 7.5 MG TABS tablet, Take 1 tablet (7.5 mg total) by mouth 2 (two) times daily with a meal., Disp: 60 tablet, Rfl: 11 .  lansoprazole  (PREVACID ) 30 MG capsule, Take 1 capsule (30 mg total) by mouth 2 (two) times daily before a meal., Disp: 60 capsule, Rfl: 2 .  levocetirizine (XYZAL ) 5 MG tablet, Take 5 mg by mouth at bedtime., Disp: , Rfl:  .  levothyroxine  (SYNTHROID ) 150 MCG tablet, Take 150 mcg by mouth daily before breakfast., Disp: , Rfl:  .  pregabalin  (LYRICA ) 300 MG capsule, Take 300 mg by mouth at bedtime., Disp: , Rfl:  .  promethazine  (PHENERGAN ) 25 MG tablet, Take 25 mg by mouth at bedtime., Disp: , Rfl:  .  torsemide  (DEMADEX ) 20 MG tablet, Take  3 tablets (60 mg total) by mouth daily., Disp: 90 tablet, Rfl: 2 .  albuterol  (VENTOLIN  HFA) 108 (90 Base) MCG/ACT inhaler, Inhale 2 puffs into the lungs daily as needed for wheezing or shortness of breath., Disp: , Rfl:  .  azelastine  (ASTELIN ) 0.1 % nasal spray, Place 2 sprays into both nostrils daily as needed for rhinitis or allergies., Disp: , Rfl:  .  BAQSIMI ONE PACK 3 MG/DOSE POWD, Place 1 Dose into the nose once as needed (hypoglycemia)., Disp: , Rfl:  .  dicyclomine  (BENTYL ) 20 MG tablet, Take 1 tablet (20 mg total) by mouth 2 (two) times daily., Disp: 20 tablet, Rfl: 0 .  Glucose Blood (BLOOD GLUCOSE TEST STRIPS) STRP, 1 each by Does not apply route 3 (three) times daily. Use as directed to check blood sugar. May dispense any manufacturer covered by patient's insurance and fits  patient's device., Disp: 100 strip, Rfl: 0 .  Lancet Device MISC, 1 each by Does not apply route 3 (three) times daily. May dispense any manufacturer covered by patient's insurance., Disp: 1 each, Rfl: 0 .  Lancets MISC, 1 each by Does not apply route 3 (three) times daily. Use as directed to check blood sugar. May dispense any manufacturer covered by patient's insurance and fits patient's device., Disp: 100 each, Rfl: 0 .  prochlorperazine  (COMPAZINE ) 10 MG tablet, Take 10 mg by mouth daily as needed for nausea or vomiting., Disp: , Rfl:  .  Safety Seal Miscellaneous MISC, Hormonic Hair Solution with minoxidil USP 7% and finasteride USP 0.05% - apply to affected areas daily every morning., Disp: 30 mL, Rfl: 2 .  SUMAtriptan  (IMITREX ) 100 MG tablet, Take 100 mg by mouth daily as needed., Disp: , Rfl:  .  tretinoin  (RETIN-A ) 0.05 % cream, Apply 1 Application topically at bedtime., Disp: , Rfl:  Allergies  Allergen Reactions  . Icosapent  Ethyl (Epa Ethyl Ester) (Fish) Hives  . Ketamine Other (See Comments)    In a vegetative state for 15 minutes, per pt  . [place] Itching and Other (See Comments)    Itchy throat  . Morphine  Hives, Itching, Rash and Other (See Comments)    Sore throat, too  . Mushroom Other (See Comments)    Pt has never had mushrooms but tested positive on allergy test  . Penicillins Hives, Itching and Rash    Tolerates cephalosporins  . Shellfish Allergy Other (See Comments)    Pt has never had shellfish, but tested positive on allergy test. Pt states contrast in CT is okay.  . Fentanyl  Nausea And Vomiting  . Prednisone Rash      Social History   Socioeconomic History  . Marital status: Divorced    Spouse name: Not on file  . Number of children: 0  . Years of education: Not on file  . Highest education level: Not on file  Occupational History  . Occupation: [employer]  . Occupation: N/A  Tobacco Use  . Smoking status: Never  . Smokeless tobacco: Never   Vaping Use  . Vaping status: Never Used  Substance and Sexual Activity  . Alcohol  use: Never  . Drug use: Never  . Sexual activity: Yes  Other Topics Concern  . Not on file  Social History Narrative  . Not on file   Social Drivers of Health   Financial Resource Strain: Medium Risk (01/22/2024)   Received from [facility]   Overall Financial Resource Strain (CARDIA)   . How hard is it for you  to pay for the very basics like food, housing, medical care, and heating?: Somewhat hard  Food Insecurity: Food Insecurity Present (01/22/2024)   Received from [facility]   Hunger Vital Sign   . Within the past 12 months, you worried that your food would run out before you got the money to buy more.: Sometimes true   . Within the past 12 months, the food you bought just didn't last and you didn't have money to get more.: Sometimes true  Transportation Needs: Unmet Transportation Needs (01/22/2024)   Received from [facility] - Transportation   . Lack of Transportation (Medical): Patient declined   . In the past 12 months, has lack of transportation kept you from meetings, work, or from getting things needed for daily living?: Yes  Physical Activity: Inactive (01/22/2024)   Received from [facility]   Exercise Vital Sign   . On average, how many days per week do you engage in moderate to strenuous exercise (like a brisk walk)?: 0 days   . Minutes of Exercise per Session: Not on file  Stress: No Stress Concern Present (01/22/2024)   Received from [facility] - Occupational Stress Questionnaire   . Do you feel stress - tense, restless, nervous, or anxious, or unable to sleep at night because your mind is troubled all the time - these days?: Not at all  Social Connections: Socially Integrated (01/22/2024)   Received from [facility]   Social Network   . How would you rate your social network (family, work, friends)?: Good  participation with social networks  Intimate Partner Violence: Not At Risk (01/22/2024)   Received from [facility]   HITS   . Over the last 12 months how often did your partner physically hurt you?: Never   . Over the last 12 months how often did your partner insult you or talk down to you?: Never   . Over the last 12 months how often did your partner threaten you with physical harm?: Never   . Over the last 12 months how often did your partner scream or curse at you?: Never    Physical Exam      Future Appointments  Date Time Provider Department Center  02/05/2024  9:30 AM Hope Almarie ORN, NP [facility] [address]  02/05/2024  2:00 PM [facility] ECHO OP 1 [facility]  02/05/2024  3:00 PM [name], Toribio SAUNDERS, MD [facility] None  05/07/2024  3:15 PM Alm Jennifer SAILOR, DO [facility] None       Izetta Quivers, Paramedic [phone] [facility] Paramedic  01/29/24

## 2024-02-01 NOTE — Progress Notes (Signed)
 Subjective   Patient ID:  Jennifer [name] is a 33 y.o. (DOB 1990/07/04) female.   This is a 33 y.o. female who presents for follow up for headaches.  The patient was last seen on 11/03/2023 (record reviewed).  The headaches are unchanged in characterization from that described in the prior note. The patient describes the headaches occurring  16-24 days per month (12-16/m LOV).The patient denies of side effects to treatment.  The patient denies complicating neurological features.  The patient does not describe overusing over the counter analgesics.    The patient reports that she has discontinued her prophylactic and abortive medications as she was trying to see if she was able to continue to have less headaches despite being off the medication.  Unfortunately, since patient has discontinued her Topamax  and amitriptyline , she has had a significant increase in the frequency and intensity of her headaches and migraines.  At her last visit, she was given a prescription for Ajovy.  Unfortunately, the patient found the Ajovy to be cost prohibitive.  She also finds the Maxalt to be ineffective.  She denies any new or concerning neurological features associate with her headaches or migraines.  New complaints/problem: No new complaints. Frequency d/wk: 4-6 Recent injection/procedures: None. Current Prophylaxis: None Current Abortives: None PT/Home exercise program? No. Nausea/Vomiting Present?: Yes. Recent Imaging?: None.   Mental Health: No history of depression, anxiety is under control.  Stress is low Sleep Quality: Poor due to constant headaches Exercise: Walks and rides bicycle 3-4 times per week days per week   Caffeine  use: No  Alcohol  Use: No Tobacco Use: no Vaping: No Artificial Sweetener Use: No  Use of Illicit drugs: No      CURRENT AND PAST MEDICATIONS:  Analgesics: Fioricet   Anti-migraine: Imitrex  (IE), Maxalt (IE)  Heart/bp:     Decongestant/ antihistamine:     Anti-nauseant:    Nsaids: Ibuprofen   Muscle relaxants:    Anti-convulsants: Topamax  (minimally helpful)  Steroids:    Sleeping pills/ tranquilizers:    Anti-depressants: Amitriptyline  (minimally helpful))  Herbal:     Fibromyalgia:     Hormonal:    Other: Emgality ($$$), Ajovy ($$$)  Procedures for headaches:      Review of Systems Constitutional: Negative for fever. Respiratory: Negative for cough and shortness of breath.   Neurological: Positive for headaches.  All other systems reviewed and are negative.  Past Medical History, Past Surgery History, Allergies, Social History, and Family History were reviewed and updated.    Medications were reviewed and reconciled.  Objective   BP 142/74 (BP Location: Left Upper Arm)   Pulse 101   Ht 4' 9 (1.448 m)   Wt 151 lb (68.5 kg)   SpO2 94%   BMI 32.68 kg/m  PHYSICAL EXAM: BP (!) 155/96 (BP Location: Right arm)   Pulse 114   Ht 4' 9 (1.448 m)   Wt 132 lb (59.9 kg)   SpO2 97%   BMI 28.56 kg/m  GENERAL: Awake, alert, in no acute distress Psych: Affect appropriate for situation, patient is calm and cooperative with examination Head: Normocephalic and atraumatic, without obvious abnormality EENT: Normal conjunctivae, dry mucous membranes, no OP obstruction LUNGS: Normal respiratory effort. Non-labored breathing on room air. Clear to auscultation bilaterally. CV: Auscultation: regular rate and rhythm. Extremities: Warm, well perfused, without obvious deformity   MENTAL STATUS EXAM: Orientation: Alert and oriented to person, place and time. Memory: Cooperative, follows commands well. Recent and remote memory normal.. Attention, concentration: Attention span and concentration  are normal. Language: Speech is clear and language is normal. Fund of knowledge: Aware of current events, vocabulary appropriate for patient age.    CRANIAL NERVES: CN 2 (Optic): Visual fields intact to confrontation CN 3,4,6 (EOM): Right CN III  palsy with ptosis; Pupils midline and reactive  CN 5 (Trigeminal): Facial sensation is normal, no weakness of masticatory muscles. CN 7 (Facial): No facial weakness or asymmetry. CN 8 (Auditory): Auditory acuity grossly normal. CN 9,10 (Glossophar): The uvula is midline, the palate elevates symmetrically. CN 11 (spinal access): Normal sternocleidomastoid and trapezius strength. CN 12 (Hypoglossal): The tongue is midline. No atrophy or fasciculations.   MOTOR: Muscle Strength:  Right UE grip 5/5 Left UE grip  5/5 Right deltoid  5/5 Left deltoid  5/5 Right biceps  5/5 Left biceps  5/5 Right triceps 5/5 Left triceps 5/5 Right wrist extensors 5/5 Left wrist extensors 0/5 Right finger extensors 5/5 Left finger extensors 1/5   Right hip flexor  5/5 Left hip flexor  4/5   Muscle Tone: Tone and muscle bulk are normal in the upper and lower extremities.  Left leg is smaller than the right   REFLEXES:   DTRs - 2+ and symmetrical in all four extremities, plantar responses are flexor bilaterally.    COORDINATION:   Intact finger-to-nose, heel-to-shin, and rapid alternating movements, no tremor.    SENSATION:  Intact to light touch, vibration, pinprick.  No sensory extinction to DSS. Negative Romberg test.   GAIT: Routine and tandem gait are normal.       DIAGNOSTIC STUDIES:   MRI Head w/ and w/o contrast -9/0/2024 -No interval change in 14 mm lesion within the right midbrain. No acute findings.   MRI pituitary with and without contrast 11/12/2021 -Normal MRI of the pituitary gland and sella with no evidence of pituitary adenoma.    MRI brain with and without contrast 10/12/2019 -Nonenhancing high T1 signal mass in the midbrain and right cerebral peduncle measuring 15 x 12 mm unchanged dating back to MRI study of March 23, 2016   Impression   1. Intractable migraine without aura and without status migrainosus   2. Chronic migraine without aura without status migrainosus, not  intractable   3. History of astrocytoma of brain   4. Atrial fibrillation, unspecified type (*)     33 year old female who returns for follow-up due to chronic headaches and migraines.  Patient's headaches and migraines have significantly increased from her last visit.  Patient is now having up to 24 headache days per month where she was having 12 to 16/month at her last office visit.  Patient wanted to discontinue all of her migraine medications to see if she was able to maintain her current status without medications.  Unfortunately, the patient's headaches and migraines have increased in frequency and intensity.  She does not have any side effects from treatment.  She denies any new neurological deficits associated with her headaches or migraines.  Her neurological examination remains unchanged.  Patient found Ajovy to be cost prohibitive and was never able to obtain the medication.  Headaches are continuing to be generally in the occipital region and occur mostly at nighttime.  Patient felt Maxalt to be ineffective.  Since she has tried and failed Imitrex  and Maxalt, I recommend that she try Ubrelvy for abortive therapy.  Patient has also tried and failed amitriptyline  and Topamax  for prophylactic therapy.  I therefore recommend she try Qulipta.  However, while we are waiting to get [place] approved, I  recommend that she resume the amitriptyline  to see if this will help decrease some of the burden of her headaches and migraines.  Once patient is on Qulipta, we will plan on discontinuing the amitriptyline .    Plan   1. Chronic migraine without aura without status migrainosus, not intractable - atogepant (QULIPTA) 60 mg TABS tablet; Take one tablet (60 mg dose) by mouth daily.  Dispense: 30 tablet; Refill: 3 - amitriptyline  HCl (ELAVIL ) 10 mg tablet; Take one tablet (10 mg dose) by mouth at bedtime.  Dispense: 30 tablet; Refill: 3  2. Intractable migraine without aura and without status migrainosus  (Primary) - ubrogepant (UBRELVY) 100 mg tablet; Take one hundred mg by mouth daily as needed.  Dispense: 16 tablet; Refill: 3  3. History of astrocytoma of brain -Stable - Asymptomatic  4. Atrial fibrillation, unspecified type (*) - Follow up with cardiology     Follow up in about 3 months (around 05/03/2024).   - I counseled the patient regarding medication overuse headaches.  It is recommended that use of over the counter analgesics be limited.  This behavior may improve the headaches.   - Recommended cardiovascular exercise for headache prevention. - Maintain headache calendar - Stay well hydrated - Sleep hygiene    Risks, benefits, and alternatives of the medications and treatment plan prescribed today were discussed.  The patient expressed understanding of the instructions. All new prescription medications and changes in prescription dosages were discussed with the patient, including patient education, medication name, use, and dosage, potential side effects, drug interactions, consequences of not using/taking and special instructions. The patient expressed understanding. No barriers to adherence are present.  Electronically Signed: Fonda FREDRIK Minus, DNP, AGNP-C Neurology Nurse Practitioner  [facility] [place] 02/01/2024 1:50 PM  *This note was dictated with voice recognition software. Inadvertently, similar sounding words can, sometimes, get transcribed incorrectly

## 2024-02-02 ENCOUNTER — Telehealth: Payer: Self-pay

## 2024-02-02 NOTE — Telephone Encounter (Signed)
Noted nfn

## 2024-02-02 NOTE — Telephone Encounter (Signed)
 Copied from CRM #[id number]. Topic: General - Other >> Feb 02, 2024 10:46 AM Corean SAUNDERS wrote: Reason for CRM: Per the note/ information request from Ashlynn on 11/7 - patient states while she does have a CPAP machine she hates to use it and doesn't.

## 2024-02-02 NOTE — Telephone Encounter (Signed)
 Pt is scheduled to see BW on Monday, 02-05-24 for a f/u. I could not find pt in [facility].   ATC pt x1. Lmtcb. I wanted to see if pt was using anything to treat her OSA and if so, could she bring her SD card or machine.

## 2024-02-05 ENCOUNTER — Encounter (HOSPITAL_COMMUNITY): Payer: Self-pay | Admitting: Internal Medicine

## 2024-02-05 ENCOUNTER — Other Ambulatory Visit: Payer: Self-pay | Admitting: Gastroenterology

## 2024-02-05 ENCOUNTER — Ambulatory Visit (INDEPENDENT_AMBULATORY_CARE_PROVIDER_SITE_OTHER): Admitting: Primary Care

## 2024-02-05 ENCOUNTER — Other Ambulatory Visit (HOSPITAL_COMMUNITY): Payer: Self-pay

## 2024-02-05 ENCOUNTER — Encounter: Payer: Self-pay | Admitting: Primary Care

## 2024-02-05 ENCOUNTER — Ambulatory Visit (HOSPITAL_COMMUNITY)
Admit: 2024-02-05 | Discharge: 2024-02-05 | Disposition: A | Discharge status: Home / Self Care | Attending: Family Medicine | Admitting: Family Medicine

## 2024-02-05 VITALS — BP 130/62 | HR 105 | Temp 97.8°F | Ht <= 58 in | Wt 151.0 lb

## 2024-02-05 DIAGNOSIS — I351 Nonrheumatic aortic (valve) insufficiency: Secondary | ICD-10-CM | POA: Insufficient documentation

## 2024-02-05 DIAGNOSIS — I5032 Chronic diastolic (congestive) heart failure: Secondary | ICD-10-CM | POA: Insufficient documentation

## 2024-02-05 DIAGNOSIS — G4730 Sleep apnea, unspecified: Secondary | ICD-10-CM

## 2024-02-05 DIAGNOSIS — I110 Hypertensive heart disease with heart failure: Secondary | ICD-10-CM | POA: Insufficient documentation

## 2024-02-05 DIAGNOSIS — G4733 Obstructive sleep apnea (adult) (pediatric): Secondary | ICD-10-CM

## 2024-02-05 DIAGNOSIS — I371 Nonrheumatic pulmonary valve insufficiency: Secondary | ICD-10-CM | POA: Insufficient documentation

## 2024-02-05 LAB — ECHOCARDIOGRAM COMPLETE
Area-P 1/2: 5.66 cm2
Calc EF: 55.3 %
Height: 57 in
MV M vel: 3.86 m/s
MV Peak grad: 59.6 mmHg
S' Lateral: 3.2 cm
Single Plane A2C EF: 53.1 %
Single Plane A4C EF: 61.4 %
Weight: 2416 [oz_av]

## 2024-02-05 NOTE — Patient Instructions (Addendum)
  VISIT SUMMARY: During your visit, we discussed your ongoing issues with CPAP therapy for your moderate obstructive sleep apnea. We reviewed your difficulties with the mask and pressure settings, and the lack of improvement in your symptoms. We also explored alternative treatment options and provided recommendations to help improve your sleep quality.  YOUR PLAN: -OBSTRUCTIVE SLEEP APNEA: Obstructive sleep apnea is a condition where the airway becomes blocked during sleep, causing breathing to stop and start repeatedly. Since you are having trouble tolerating CPAP therapy, we have ordered a CPAP titration study at [facility] to adjust the pressure settings and explore different mask options. If CPAP remains intolerable, we discussed Inspire therapy as a potential future option. Additionally, you should avoid alcohol  before bedtime, try to sleep on your side, and avoid sleeping flat on your back.  INSTRUCTIONS: We will order titration study to re-assess mask fit and pressure settings. Aim to wear CPAP nightly minium 4-6 hours. Please follow up in 8 to 12 weeks to assess the results of the CPAP titration study and your comfort with the new settings.  Orders:  CPAP titration study   Follow-up 8-12 weeks with Beth NP for CPAP compliance

## 2024-02-05 NOTE — Progress Notes (Signed)
  Echocardiogram 2D Echocardiogram has been performed.  Koleen KANDICE Popper, RDCS 02/05/2024, 2:41 PM

## 2024-02-05 NOTE — Progress Notes (Signed)
 @Patient  ID: Delon Velia Freestone, female    DOB: March 16, 1991, 32 y.o.   MRN: [id number]  No chief complaint on file.   Referring provider: [name]  HPI: 33 year old female, never smoked.  Past medical history significant for congestive heart failure, hypertension, pulmonary hypertension, cardial infarction, hepatic steatosis, gastroparesis, type 1 diabetes, hypothyroidism, kidney disease.  Previous [facility]  05/04/2023 Discussed the use of AI scribe software for clinical note transcription with the patient, who gave verbal consent to proceed.  History of Present Illness   Camren Henthorn is a 33 year old female who presents with difficulty sleeping. She was referred by Joesph Emilio, her primary care doctor, for evaluation of sleep disturbances.  She has been experiencing persistent difficulty sleeping, characterized by trouble both falling and staying asleep. Her typical bedtime is between 10:00 and 10:30 PM, but it takes her 30 to 40 minutes to fall asleep. She wakes up 30 to 40 times a night, often gasping for air. No snoring, but if she starts snoring, she usually wakes up. Her weight has been increasing over the past two years without a clear explanation, which she suspects might be related to fluid retention.  There is a concern for sleep apnea due to episodes of oxygen desaturation observed during hospital stays, where she wakes up gasping for air and dreams of being underwater. However, multiple home sleep studies, including the most recent one in October 2023, have returned negative results for sleep apnea. She has not undergone an in-lab sleep study.  She has a history of hypertension, with recent blood pressure readings elevated at 160/90-100 mmHg. She experiences symptoms such as hot flashes and headaches when her blood pressure is high. She monitors her blood pressure at home and is managed by her cardiologist.      11/01/2023 Discussed the use of AI  scribe software for clinical note transcription with the patient, who gave verbal consent to proceed.  History of Present Illness Manasvini Whatley is a 33 year old female with moderate to severe sleep apnea who presents with difficulty tolerating CPAP therapy. She was referred by Joesph Emilio for sleep disturbances.  She experiences difficulty with CPAP therapy, describing it as 'awful' and anxiety-inducing. CPAP usage was inconsistent, with six to seven days recorded in May, but she was hospitalized for most of that month. In June and July, she used the machine but often removed it during sleep due to discomfort.  A sleep study on April 4th showed 28 apneic events per hour, snoring, and oxygen desaturations to 75%, with an average oxygen level of 91%.  The nasal CPAP mask causes discomfort as she feels the air going down her throat, which 'freaks her out'. She has a fear of masks, possibly related to claustrophobia since childhood. She alternates between a nasal pillow and a full mask, finding the latter more effective despite discomfort.  She takes Lyrica  at bedtime for general neuropathy, which does not usually cause grogginess or drowsiness. No use of sedatives, pain medications, or sleep aids.  Airview download 08/02/23-10/30/23 Usage days 12/90 days (13%) Average usage days used 4 hours 1 minutes Pressure 5-15cm h20  Airleaks 84.3L/min (95%) AHI 4.3      02/05/2024- interim hx  Discussed the use of AI scribe software for clinical note transcription with the patient, who gave verbal consent to proceed.  History of Present Illness Latrece Nitta is a 33 year old female with moderate obstructive sleep apnea who presents for follow-up regarding  CPAP intolerance.  She was diagnosed with moderate obstructive sleep apnea via a sleep study in April 2025. She experiences difficulty tolerating CPAP therapy, despite attempts to adjust the settings, including lowering the pressure to  ten. She does not use the CPAP consistently due to discomfort, particularly with the mask and the sensation of pressure 'going down her throat'.  There has been no noticeable improvement in her symptoms with CPAP use, as she continues to wake up tired. No recent episodes of snoring, gasping, or choking during sleep, although these symptoms were present in the past. No recent weight loss has been noted.  [name] currently taking torsemide , a diuretic, and is scheduled to see her cardiologist soon. She does not consume alcohol  and avoids drinking before bed.   Allergies  Allergen Reactions   Icosapent  Ethyl (Epa Ethyl Ester) (Fish) Hives   Ketamine Other (See Comments)    In a vegetative state for 15 minutes, per pt   Maitake Itching and Other (See Comments)    Itchy throat   Morphine  Hives, Itching, Rash and Other (See Comments)    Sore throat, too   Mushroom Other (See Comments)    Pt has never had mushrooms but tested positive on allergy test   Penicillins Hives, Itching and Rash    Tolerates cephalosporins   Shellfish Allergy Other (See Comments)    Pt has never had shellfish, but tested positive on allergy test. Pt states contrast in CT is okay.   Fentanyl  Nausea And Vomiting   Prednisone Rash    Immunization History  Administered Date(s) Administered   Fluzone Influenza virus vaccine,trivalent (IIV3), split virus 01/07/2019   Hepb-cpg 05/12/2021, 07/12/2021   Influenza, Mdck, Trivalent,PF 6+ MOS(egg free) 01/12/2023   Influenza,inj,Quad PF,6+ Mos 01/07/2019, 01/02/2020   Influenza-Unspecified 01/07/2019, 01/02/2020, 12/10/2020, 11/24/2021   PNEUMOCOCCAL CONJUGATE-20 05/12/2021   PPD Test 09/08/2020, 11/08/2022   Pneumococcal Conjugate-13 03/29/2020, 12/10/2020   Pneumococcal Polysaccharide-23 01/07/2019, 09/09/2022   Pneumococcal-Unspecified 09/09/2022   Tdap 01/07/2019    Past Medical History:  Diagnosis Date   Afib (HCC) 05/12/2021   Brain tumor (HCC) 03/29/1995    astrocytoma   CHF (congestive heart failure) (HCC)    Cholesterosis    CKD (chronic kidney disease) stage 4, GFR 15-29 ml/min (HCC) 05/13/2021   DM (diabetes mellitus) (HCC) 10/10/2018   Fatty liver    Gastroparesis due to DM (HCC) 05/12/2021   HTN (hypertension) 10/10/2018   Hypothyroidism 10/10/2018   Lipoprotein deficiency    Lung disease    longevity long term   Pancreatitis    Polycystic ovary syndrome     Tobacco History: Social History   Tobacco Use  Smoking Status Never  Smokeless Tobacco Never   Counseling given: Not Answered   Outpatient Medications Prior to Visit  Medication Sig Dispense Refill   albuterol  (VENTOLIN  HFA) 108 (90 Base) MCG/ACT inhaler Inhale 2 puffs into the lungs daily as needed for wheezing or shortness of breath.     aspirin  EC 81 MG tablet Take 1 tablet (81 mg total) by mouth daily. Swallow whole. 30 tablet 1   atorvastatin  (LIPITOR) 80 MG tablet Take 1 tablet (80 mg total) by mouth every evening. 30 tablet 0   azelastine  (ASTELIN ) 0.1 % nasal spray Place 2 sprays into both nostrils daily as needed for rhinitis or allergies.     BAQSIMI ONE PACK 3 MG/DOSE POWD Place 1 Dose into the nose once as needed (hypoglycemia).     calcitRIOL  (ROCALTROL ) 0.25 MCG capsule Take  0.25 mcg by mouth daily.     dicyclomine  (BENTYL ) 20 MG tablet Take 1 tablet (20 mg total) by mouth 2 (two) times daily. 20 tablet 0   DULoxetine  (CYMBALTA ) 60 MG capsule Take 60 mg by mouth at bedtime.     ergocalciferol  (VITAMIN D2) 1.25 MG (50000 UT) capsule Take 1 capsule (50,000 Units total) by mouth every Sunday. 4 capsule 0   fenofibrate  54 MG tablet Take 1 tablet (54 mg total) by mouth daily. 30 tablet 0   Glucose Blood (BLOOD GLUCOSE TEST STRIPS) STRP 1 each by Does not apply route 3 (three) times daily. Use as directed to check blood sugar. May dispense any manufacturer covered by patient's insurance and fits patient's device. 100 strip 0   HUMALOG  KWIKPEN 100 UNIT/ML KwikPen  Inject 60 Units into the skin in the morning, at noon, and at bedtime. (Patient taking differently: Inject 60 Units into the skin in the morning, at noon, and at bedtime.)     HUMULIN  R U-500 KWIKPEN 500 UNIT/ML KwikPen Inject 300 Units into the skin in the morning and at bedtime.     hydrALAZINE  (APRESOLINE ) 100 MG tablet Take 1 tablet (100 mg total) by mouth every 8 (eight) hours. 90 tablet 0   Insulin  Pen Needle (PEN NEEDLES) 31G X 5 MM MISC Use as directed 3 (three) times daily. 100 each 0   isosorbide  mononitrate (IMDUR ) 60 MG 24 hr tablet Take 1 tablet (60 mg total) by mouth daily. 30 tablet 0   ivabradine  (CORLANOR ) 7.5 MG TABS tablet Take 1 tablet (7.5 mg total) by mouth 2 (two) times daily with a meal. 60 tablet 11   Lancet Device MISC 1 each by Does not apply route 3 (three) times daily. May dispense any manufacturer covered by patient's insurance. 1 each 0   Lancets MISC 1 each by Does not apply route 3 (three) times daily. Use as directed to check blood sugar. May dispense any manufacturer covered by patient's insurance and fits patient's device. 100 each 0   lansoprazole  (PREVACID ) 30 MG capsule Take 1 capsule (30 mg total) by mouth 2 (two) times daily before a meal. 60 capsule 2   levocetirizine (XYZAL ) 5 MG tablet Take 5 mg by mouth at bedtime.     levothyroxine  (SYNTHROID ) 150 MCG tablet Take 150 mcg by mouth daily before breakfast.     pregabalin  (LYRICA ) 300 MG capsule Take 300 mg by mouth at bedtime.     prochlorperazine  (COMPAZINE ) 10 MG tablet Take 10 mg by mouth daily as needed for nausea or vomiting.     promethazine  (PHENERGAN ) 25 MG tablet Take 25 mg by mouth at bedtime.     Safety Seal Miscellaneous MISC Hormonic Hair Solution with minoxidil USP 7% and finasteride USP 0.05% - apply to affected areas daily every morning. 30 mL 2   SUMAtriptan  (IMITREX ) 100 MG tablet Take 100 mg by mouth daily as needed.     torsemide  (DEMADEX ) 20 MG tablet Take 3 tablets (60 mg total) by  mouth daily. 90 tablet 2   tretinoin  (RETIN-A ) 0.05 % cream Apply 1 Application topically at bedtime.     No facility-administered medications prior to visit.      Review of Systems  Review of Systems  Constitutional:  Positive for unexpected weight change.  Respiratory: Negative.       Physical Exam  There were no vitals taken for this visit. Physical Exam Constitutional:      Appearance: Normal appearance. [name]  well-developed. [name] not ill-appearing.  HENT:     Head: Normocephalic and atraumatic.     Mouth/Throat:     Mouth: Mucous membranes are moist.     Pharynx: Oropharynx is clear.  Eyes:     Pupils: Pupils are equal, round, and reactive to light.  Cardiovascular:     Rate and Rhythm: Normal rate and regular rhythm.     Heart sounds: Normal heart sounds. No murmur heard. Pulmonary:     Effort: Pulmonary effort is normal. No respiratory distress.     Breath sounds: Normal breath sounds. No wheezing or rhonchi.  Musculoskeletal:        General: Normal range of motion.     Cervical back: Normal range of motion and neck supple.  Skin:    General: Skin is warm and dry.     Findings: No erythema or rash.  Neurological:     General: No focal deficit present.     Mental Status: [name] alert and oriented to person, place, and time. Mental status is at baseline.  Psychiatric:        Mood and Affect: Mood normal.        Behavior: Behavior normal.        Thought Content: Thought content normal.        Judgment: Judgment normal.     Lab Results:  CBC    Component Value Date/Time   WBC 8.8 12/29/2023 0211   RBC 3.57 (L) 12/29/2023 0211   HGB 11.8 (L) 12/29/2023 0211   HCT 31.2 (L) 12/29/2023 0211   PLT 183 12/29/2023 0211   MCV 89.3 12/29/2023 0211   MCH 29.9 12/29/2023 0211   MCHC 33.5 12/29/2023 0211   RDW 15.4 12/29/2023 0211   LYMPHSABS 3.6 12/11/2023 0249   MONOABS 0.6 12/11/2023 0249   EOSABS 0.1 12/11/2023 0249   BASOSABS 0.1 12/11/2023 0249     BMET    Component Value Date/Time   NA 126 (L) 01/08/2024 1514   NA 131 (L) 11/10/2023 1627   K 3.9 01/08/2024 1514   CL 91 (L) 01/08/2024 1514   CO2 22 01/08/2024 1514   GLUCOSE 360 (H) 01/08/2024 1514   BUN 77 (H) 01/08/2024 1514   BUN 70 (H) 11/10/2023 1627   CREATININE 2.88 (H) 01/08/2024 1514   CALCIUM  9.1 01/08/2024 1514   GFRNONAA 22 (L) 01/08/2024 1514   GFRAA 68 08/22/2019 0914    BNP    Component Value Date/Time   BNP 37.4 12/28/2023 1425    ProBNP    Component Value Date/Time   PROBNP 433.0 (H) 12/07/2023 0018    Imaging: DG Ankle Complete Left Result Date: 01/15/2024 Please see detailed radiograph report in office note.    Assessment & Plan:   1. Sleep apnea, unspecified type (Primary)  Assessment and Plan Assessment & Plan Obstructive sleep apnea Moderate obstructive sleep apnea diagnosed via sleep study in April 2025. CPAP therapy not tolerated due to discomfort with mask and pressure settings. No perceived benefit from CPAP use. No recent snoring, gasping, or choking reported. BMI less than 40, making her a candidate for Inspire therapy if CPAP remains intolerable. - Ordered CPAP titration study at [facility] to adjust pressure settings and explore mask options. - Provided information on Inspire therapy as a potential future option if CPAP remains intolerable. Explained that patient would needs DISE procedure and referral to ENT.  - Advised against alcohol  consumption before bedtime. - Recommended sleeping on her side and avoiding  sleeping flat on her back. - Scheduled follow-up in 8 to 12 weeks to assess CPAP titration study results and comfort with new settings.    Almarie LELON Ferrari, NP 02/05/2024

## 2024-02-05 NOTE — Progress Notes (Signed)
 Paramedicine Encounter    Patient ID: Jennifer [name], female    DOB: 1990-09-12, 33 y.o.   MRN: [id number]   Complaints-headaches and left ankle pain   Edema-none   Compliance with meds-yes-due for her mid day dose of hydralazine    Pill box filled-yes If so, by whom-paramedic   Refills needed- atorvastatin  Dexcom sensors Fenofibrate -delivered last week but she didn't get it? Lost/stolen?  Lansoprazole  These 3 below are new meds for her migraines/headaches Amitriptyline -new med Atogepant-need PA Qulipta-need PA    Pt went to sleep apnea provider today. They are sending her to be fitted for another mask so she will be more compliant with cpap. If not they will see if she is approved for the inspra.  She did not weigh at home today, so the weight today was at the doc office she went to today.  Pt reports palpitations are much improved.  It appears by pill box no missed doses of her meds.  [name] due for her mid day dose of hydralazine .  Pt denies increased sob, no dizziness, no c/p. No edema noted. Possibly some bloating to her abd. She denies it though, but looks a tad to me.  Meds verified and pill box refilled.  [name] being started on some new meds for her h/a and migraines, but waiting on approval for the 2 listed above.  Her echo and provider appoint got resch and she is having echo done today and will see provider on Wednesday.  Will f/u after that for any med changes.    BP (!) 154/82   Pulse (!) 102   Resp 16   SpO2 98%  Weight at office today-151 Weight yesterday-? Last visit weight-142  Patient Care Team: [name] as PCP - General (Physician Assistant) Tommas Pears, MD as PCP - Endocrinology (Endocrinology) Ladona Heinz, MD as PCP - Cardiology (Cardiology)  Patient Active Problem List   Diagnosis Date Noted   Skin infection 01/02/2024   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) (HCC) 12/28/2023    Hypertensive urgency 12/28/2023   Acute exacerbation of CHF (congestive heart failure) (HCC) 12/07/2023   Controlled type 2 diabetes mellitus with hyperglycemia (HCC) 08/31/2023   Nausea without vomiting 08/31/2023   Epigastric pain 08/31/2023   Sleep apnea 08/30/2023   Abscess 08/30/2023   SOB (shortness of breath) 08/20/2023   Dizziness 08/20/2023   Chronic pancreatitis (HCC) 08/15/2023   Witnessed episode of apnea 06/27/2023   Pseudohyponatremia 04/10/2023   Metabolic acidosis 04/10/2023   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 coli UTI (urinary tract infection) 06/01/2022   DKA (diabetic ketoacidosis) (HCC) 06/01/2022   ASCUS of cervix with negative high risk HPV 05/31/2022   Acute on chronic pancreatitis (HCC) 05/29/2022   Hypokalemia 03/23/2022   COVID 03/15/2022   COVID-19 virus infection 03/14/2022   Hyponatremia 03/14/2022   Positive D dimer 03/14/2022   Stage 3b chronic kidney disease (CKD) (HCC) 03/14/2022   Pulmonary hypertension (HCC) 02/06/2022  NASH (nonalcoholic steatohepatitis) 02/06/2022   Obesity (BMI 30-39.9) 02/06/2022   Heart failure (HCC) 12/03/2021   Acute on chronic diastolic CHF (congestive heart failure) (HCC) 12/02/2021   Elevated troponin 12/02/2021   Pancytopenia (HCC) 12/02/2021   Amenorrhea 12/02/2021   Hepatic steatosis 09/27/2021   Hyperosmolar hyperglycemic state (HHS) (HCC) 08/26/2021   Small intestinal bacterial overgrowth (SIBO)  08/26/2021   Lower extremity edema 08/26/2021   Hyperkalemia 08/14/2021   Hx of insulin  dependent diabetes mellitus 08/14/2021   CKD stage 3b, GFR 30-44 ml/min (HCC) 05/13/2021   Gastroparesis due to DM (HCC) 05/12/2021   Abdominal distension 05/12/2021   Chest pain 03/10/2021   Acute on chronic combined systolic and diastolic CHF (congestive heart failure) (HCC) 02/09/2021   History of astrocytoma of brain 02/09/2021   Type 2 diabetes mellitus with hyperglycemia, with long-term current use of insulin  (HCC) 01/25/2021   Diarrhea    Elevated transaminase level    Orthostatic hypotension 11/28/2020   Acute combined systolic and diastolic heart failure (HCC)    Nonischemic cardiomyopathy (HCC)    Acute systolic congestive heart failure (HCC)    Elevated liver enzymes    Acute renal failure superimposed on stage 3b chronic kidney disease (HCC) 11/26/2020   Intractable abdominal pain 11/23/2020   Chronic systolic congestive heart failure (HCC) 11/23/2020   Essential hypertension 11/23/2020   Familial hypertriglyceridemia 11/23/2020   Prolonged QT interval 11/23/2020   Finger mass, left 08/12/2020   Nausea and vomiting 07/23/2020   Polycystic ovary syndrome 01/30/2020   Moderate aortic insufficiency 08/16/2019   Moderate mitral regurgitation 08/16/2019   Lipoprotein lipase deficiency, familial 06/19/2019   Microalbuminuria due to type 1 diabetes mellitus (HCC) 06/19/2019   Type 2 diabetes mellitus with hyperlipidemia (HCC) 10/10/2018   Hypothyroidism 10/10/2018    Current Outpatient Medications:    albuterol  (VENTOLIN  HFA) 108 (90 Base) MCG/ACT inhaler, Inhale 2 puffs into the lungs daily as needed for wheezing or shortness of breath., Disp: , Rfl:    amitriptyline  (ELAVIL ) 10 MG tablet, Take 10 mg by mouth., Disp: , Rfl:    aspirin  EC 81 MG tablet, Take 1 tablet (81 mg total) by mouth daily. Swallow whole., Disp: 30 tablet, Rfl: 1   Atogepant 60 MG TABS, Take 60 mg by mouth., Disp:  , Rfl:    atorvastatin  (LIPITOR) 80 MG tablet, Take 1 tablet (80 mg total) by mouth every evening., Disp: 30 tablet, Rfl: 0   azelastine  (ASTELIN ) 0.1 % nasal spray, Place 2 sprays into both nostrils daily as needed for rhinitis or allergies., Disp: , Rfl:    BAQSIMI ONE PACK 3 MG/DOSE POWD, Place 1 Dose into the nose once as needed (hypoglycemia)., Disp: , Rfl:    calcitRIOL  (ROCALTROL ) 0.25 MCG capsule, Take 0.25 mcg by mouth daily., Disp: , Rfl:    dicyclomine  (BENTYL ) 20 MG tablet, Take 1 tablet (20 mg total) by mouth 2 (two) times daily., Disp: 20 tablet, Rfl: 0   DULoxetine  (CYMBALTA ) 60 MG capsule, Take 60 mg by mouth at bedtime., Disp: , Rfl:    ergocalciferol  (VITAMIN D2) 1.25 MG (50000 UT) capsule, Take 1 capsule (50,000 Units total) by mouth every Sunday., Disp: 4 capsule, Rfl: 0   fenofibrate  54 MG tablet, Take 1 tablet (54 mg total) by mouth daily., Disp: 30 tablet, Rfl: 0   Glucose Blood (BLOOD GLUCOSE TEST STRIPS) STRP, 1 each by Does not apply route 3 (three) times daily. Use as directed to check blood sugar. May dispense any manufacturer covered  by patient's insurance and fits patient's device., Disp: 100 strip, Rfl: 0   HUMALOG  KWIKPEN 100 UNIT/ML KwikPen, Inject 60 Units into the skin in the morning, at noon, and at bedtime. (Patient taking differently: Inject 60 Units into the skin in the morning, at noon, and at bedtime.), Disp: , Rfl:    HUMULIN  R U-500 KWIKPEN 500 UNIT/ML KwikPen, Inject 300 Units into the skin in the morning and at bedtime., Disp: , Rfl:    hydrALAZINE  (APRESOLINE ) 100 MG tablet, Take 1 tablet (100 mg total) by mouth every 8 (eight) hours., Disp: 90 tablet, Rfl: 0   Insulin  Pen Needle (PEN NEEDLES) 31G X 5 MM MISC, Use as directed 3 (three) times daily., Disp: 100 each, Rfl: 0   isosorbide  mononitrate (IMDUR ) 60 MG 24 hr tablet, Take 1 tablet (60 mg total) by mouth daily., Disp: 30 tablet, Rfl: 0   ivabradine  (CORLANOR ) 7.5 MG TABS tablet, Take 1 tablet (7.5 mg  total) by mouth 2 (two) times daily with a meal., Disp: 60 tablet, Rfl: 11   Lancet Device MISC, 1 each by Does not apply route 3 (three) times daily. May dispense any manufacturer covered by patient's insurance., Disp: 1 each, Rfl: 0   Lancets MISC, 1 each by Does not apply route 3 (three) times daily. Use as directed to check blood sugar. May dispense any manufacturer covered by patient's insurance and fits patient's device., Disp: 100 each, Rfl: 0   lansoprazole  (PREVACID ) 30 MG capsule, TAKE 1 CAPSULE (30 MG TOTAL) BY MOUTH 2 (TWO) TIMES DAILY BEFORE A MEAL., Disp: 60 capsule, Rfl: 2   levocetirizine (XYZAL ) 5 MG tablet, Take 5 mg by mouth at bedtime., Disp: , Rfl:    levothyroxine  (SYNTHROID ) 150 MCG tablet, Take 150 mcg by mouth daily before breakfast., Disp: , Rfl:    pregabalin  (LYRICA ) 300 MG capsule, Take 300 mg by mouth at bedtime., Disp: , Rfl:    prochlorperazine  (COMPAZINE ) 10 MG tablet, Take 10 mg by mouth daily as needed for nausea or vomiting., Disp: , Rfl:    promethazine  (PHENERGAN ) 25 MG tablet, Take 25 mg by mouth at bedtime., Disp: , Rfl:    Safety Seal Miscellaneous MISC, Hormonic Hair Solution with minoxidil USP 7% and finasteride USP 0.05% - apply to affected areas daily every morning., Disp: 30 mL, Rfl: 2   SUMAtriptan  (IMITREX ) 100 MG tablet, Take 100 mg by mouth daily as needed., Disp: , Rfl:    torsemide  (DEMADEX ) 20 MG tablet, Take 3 tablets (60 mg total) by mouth daily., Disp: 90 tablet, Rfl: 2   tretinoin  (RETIN-A ) 0.05 % cream, Apply 1 Application topically at bedtime., Disp: , Rfl:  Allergies  Allergen Reactions   Icosapent  Ethyl (Epa Ethyl Ester) (Fish) Hives   Ketamine Other (See Comments)    In a vegetative state for 15 minutes, per pt   Maitake Itching and Other (See Comments)    Itchy throat   Morphine  Hives, Itching, Rash and Other (See Comments)    Sore throat, too   Mushroom Other (See Comments)    Pt has never had mushrooms but tested positive on  allergy test   Penicillins Hives, Itching and Rash    Tolerates cephalosporins   Shellfish Allergy Other (See Comments)    Pt has never had shellfish, but tested positive on allergy test. Pt states contrast in CT is okay.   Fentanyl  Nausea And Vomiting   Prednisone Rash      Social History   Socioeconomic History  Marital status: Divorced    Spouse name: Not on file   Number of children: 0   Years of education: Not on file   Highest education level: Not on file  Occupational History   Occupation: Dance Movement Psychotherapist   Occupation: N/A  Tobacco Use   Smoking status: Never   Smokeless tobacco: Never  Vaping Use   Vaping status: Never Used  Substance and Sexual Activity   Alcohol  use: Never   Drug use: Never   Sexual activity: Yes  Other Topics Concern   Not on file  Social History Narrative   Not on file   Social Drivers of Health   Financial Resource Strain: Medium Risk (01/22/2024)   Received from [facility]   Overall Financial Resource Strain (CARDIA)    How hard is it for you to pay for the very basics like food, housing, medical care, and heating?: Somewhat hard  Food Insecurity: Food Insecurity Present (01/22/2024)   Received from [facility]   Hunger Vital Sign    Within the past 12 months, you worried that your food would run out before you got the money to buy more.: Sometimes true    Within the past 12 months, the food you bought just didn't last and you didn't have money to get more.: Sometimes true  Transportation Needs: Unmet Transportation Needs (01/22/2024)   Received from [facility] - [facility]    Lack of Transportation (Medical): Patient declined    In the past 12 months, has lack of transportation kept you from meetings, work, or from getting things needed for daily living?: Yes  Physical Activity: Inactive (01/22/2024)   Received from [facility]   Exercise Vital Sign    On average, how many days per week do you engage in  moderate to strenuous exercise (like a brisk walk)?: 0 days    Minutes of Exercise per Session: Not on file  Stress: No Stress Concern Present (01/22/2024)   Received from [facility] - Occupational Stress Questionnaire    Do you feel stress - tense, restless, nervous, or anxious, or unable to sleep at night because your mind is troubled all the time - these days?: Not at all  Social Connections: Socially Integrated (01/22/2024)   Received from [facility]   Social Network    How would you rate your social network (family, work, friends)?: Good participation with social networks  Intimate Partner Violence: Not At Risk (01/22/2024)   Received from [facility]   HITS    Over the last 12 months how often did your partner physically hurt you?: Never    Over the last 12 months how often did your partner insult you or talk down to you?: Never    Over the last 12 months how often did your partner threaten you with physical harm?: Never    Over the last 12 months how often did your partner scream or curse at you?: Never    Physical Exam      Future Appointments  Date Time Provider Department Center  02/07/2024  3:20 PM [name], Toribio SAUNDERS, MD [facility] None  04/05/2024  1:00 PM Hope Almarie ORN, NP [facility] [address]  05/07/2024  3:15 PM Alm Jennifer SAILOR, DO [facility] None       Izetta Quivers, Paramedic [phone] [facility] Paramedic  02/05/24

## 2024-02-06 ENCOUNTER — Telehealth (HOSPITAL_COMMUNITY): Payer: Self-pay | Admitting: Internal Medicine

## 2024-02-06 NOTE — Telephone Encounter (Signed)
 Called to confirm/remind patient of their appointment at the [facility] on 02/06/24.   Appointment:   [] Confirmed  [x] Left mess   [] No answer/No voice mail  [] VM Full/unable to leave message  [] Phone not in service  Patient reminded to bring all medications and/or complete list.  Confirmed patient has transportation. Gave directions, instructed to utilize valet parking.

## 2024-02-07 ENCOUNTER — Encounter (HOSPITAL_COMMUNITY): Payer: Self-pay | Admitting: Internal Medicine

## 2024-02-07 ENCOUNTER — Ambulatory Visit (HOSPITAL_COMMUNITY)
Admission: RE | Admit: 2024-02-07 | Discharge: 2024-02-07 | Disposition: A | Discharge status: Home / Self Care | Source: Ambulatory Visit | Attending: Internal Medicine | Admitting: Internal Medicine

## 2024-02-07 VITALS — BP 160/70 | HR 104 | Wt 152.6 lb

## 2024-02-07 DIAGNOSIS — Z79899 Other long term (current) drug therapy: Secondary | ICD-10-CM | POA: Diagnosis not present

## 2024-02-07 DIAGNOSIS — I10 Essential (primary) hypertension: Secondary | ICD-10-CM

## 2024-02-07 DIAGNOSIS — I272 Pulmonary hypertension, unspecified: Secondary | ICD-10-CM | POA: Diagnosis not present

## 2024-02-07 DIAGNOSIS — I130 Hypertensive heart and chronic kidney disease with heart failure and stage 1 through stage 4 chronic kidney disease, or unspecified chronic kidney disease: Secondary | ICD-10-CM | POA: Diagnosis not present

## 2024-02-07 DIAGNOSIS — Z794 Long term (current) use of insulin: Secondary | ICD-10-CM | POA: Insufficient documentation

## 2024-02-07 DIAGNOSIS — E781 Pure hyperglyceridemia: Secondary | ICD-10-CM | POA: Insufficient documentation

## 2024-02-07 DIAGNOSIS — I5022 Chronic systolic (congestive) heart failure: Secondary | ICD-10-CM | POA: Diagnosis present

## 2024-02-07 DIAGNOSIS — E0822 Diabetes mellitus due to underlying condition with diabetic chronic kidney disease: Secondary | ICD-10-CM | POA: Diagnosis not present

## 2024-02-07 DIAGNOSIS — I428 Other cardiomyopathies: Secondary | ICD-10-CM | POA: Diagnosis not present

## 2024-02-07 DIAGNOSIS — I2510 Atherosclerotic heart disease of native coronary artery without angina pectoris: Secondary | ICD-10-CM | POA: Diagnosis not present

## 2024-02-07 DIAGNOSIS — R1900 Intra-abdominal and pelvic swelling, mass and lump, unspecified site: Secondary | ICD-10-CM

## 2024-02-07 DIAGNOSIS — Z7982 Long term (current) use of aspirin: Secondary | ICD-10-CM | POA: Insufficient documentation

## 2024-02-07 DIAGNOSIS — N184 Chronic kidney disease, stage 4 (severe): Secondary | ICD-10-CM | POA: Diagnosis not present

## 2024-02-07 DIAGNOSIS — I5032 Chronic diastolic (congestive) heart failure: Secondary | ICD-10-CM | POA: Diagnosis not present

## 2024-02-07 DIAGNOSIS — E786 Lipoprotein deficiency: Secondary | ICD-10-CM | POA: Insufficient documentation

## 2024-02-07 DIAGNOSIS — G8194 Hemiplegia, unspecified affecting left nondominant side: Secondary | ICD-10-CM | POA: Diagnosis not present

## 2024-02-07 DIAGNOSIS — K861 Other chronic pancreatitis: Secondary | ICD-10-CM | POA: Insufficient documentation

## 2024-02-07 DIAGNOSIS — I361 Nonrheumatic tricuspid (valve) insufficiency: Secondary | ICD-10-CM | POA: Diagnosis not present

## 2024-02-07 DIAGNOSIS — Z85841 Personal history of malignant neoplasm of brain: Secondary | ICD-10-CM | POA: Diagnosis not present

## 2024-02-07 DIAGNOSIS — K769 Liver disease, unspecified: Secondary | ICD-10-CM | POA: Diagnosis not present

## 2024-02-07 DIAGNOSIS — G4733 Obstructive sleep apnea (adult) (pediatric): Secondary | ICD-10-CM

## 2024-02-07 DIAGNOSIS — M79672 Pain in left foot: Secondary | ICD-10-CM | POA: Diagnosis not present

## 2024-02-07 DIAGNOSIS — R140 Abdominal distension (gaseous): Secondary | ICD-10-CM | POA: Insufficient documentation

## 2024-02-07 LAB — BRAIN NATRIURETIC PEPTIDE: B Natriuretic Peptide: 38.1 pg/mL (ref 0.0–100.0)

## 2024-02-07 LAB — COMPREHENSIVE METABOLIC PANEL WITH GFR
ALT: 111 U/L — ABNORMAL HIGH (ref 0–44)
AST: 108 U/L — ABNORMAL HIGH (ref 15–41)
Albumin: 4.3 g/dL (ref 3.5–5.0)
Alkaline Phosphatase: 106 U/L (ref 38–126)
Anion gap: 19 — ABNORMAL HIGH (ref 5–15)
BUN: 84 mg/dL — ABNORMAL HIGH (ref 6–20)
CO2: 20 mmol/L — ABNORMAL LOW (ref 22–32)
Calcium: 9.7 mg/dL (ref 8.9–10.3)
Chloride: 95 mmol/L — ABNORMAL LOW (ref 98–111)
Creatinine, Ser: 3.01 mg/dL — ABNORMAL HIGH (ref 0.44–1.00)
GFR, Estimated: 20 mL/min — ABNORMAL LOW (ref >60)
Glucose, Bld: 290 mg/dL — ABNORMAL HIGH (ref 70–99)
Potassium: 4.6 mmol/L (ref 3.5–5.1)
Sodium: 134 mmol/L — ABNORMAL LOW (ref 135–145)
Total Bilirubin: 1.2 mg/dL (ref 0.0–1.2)
Total Protein: 8.2 g/dL — ABNORMAL HIGH (ref 6.5–8.1)

## 2024-02-07 LAB — URIC ACID: Uric Acid, Serum: 11.5 mg/dL — ABNORMAL HIGH (ref 2.5–7.1)

## 2024-02-07 MED ORDER — DOXAZOSIN MESYLATE 2 MG PO TABS: 2.0000 mg | ORAL_TABLET | Freq: Every day | ORAL | 3 refills | Status: DC

## 2024-02-07 NOTE — Patient Instructions (Signed)
 Good to see you today!   START Doxazosin 2 mg(1 tablet) daily  Labs done today, your results will be available in MyChart, we will contact you for abnormal readings.  Your physician recommends that you schedule a follow-up appointment in:2 months as scheduled  If you have any questions or concerns before your next appointment please send us  a message through [place] or call our office at [phone].    TO LEAVE A MESSAGE FOR THE NURSE SELECT OPTION 2, PLEASE LEAVE A MESSAGE INCLUDING: YOUR NAME DATE OF BIRTH CALL BACK NUMBER REASON FOR CALL**this is important as we prioritize the call backs  YOU WILL RECEIVE A CALL BACK THE SAME DAY AS LONG AS YOU CALL BEFORE 4:00 PM At the [facility], you and your health needs are our priority. As part of our continuing mission to provide you with exceptional heart care, we have created designated Provider Care Teams. These Care Teams include your primary Cardiologist (physician) and Advanced Practice Providers (APPs- Physician Assistants and Nurse Practitioners) who all work together to provide you with the care you need, when you need it.   You may see any of the following providers on your designated Care Team at your next follow up: Dr Toribio Fuel Dr Ezra Shuck Dr. Morene Brownie Greig Mosses, NP Caffie Shed, [place] [facility] [place], [place] Beckey Coe, NP Jordan Lee, NP [name] Class, NP Tinnie Redman, PharmD Jaun Bash, PharmD   Please be sure to bring in all your medications bottles to every appointment.    Thank you for choosing [facility]

## 2024-02-07 NOTE — Progress Notes (Signed)
 Advanced Heart Failure Clinic Note   PCP: Emilio Joesph DEL, PA-C Primary Cardiologist: [name], MD HF Cardiologist: Dr. [name]    HPI: Jennifer [name] is a 33 y.o. 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   Has been followed by Dr. Bergamo for her HF.    Admitted 8/22 with vasculitis (no pancreatitis). Her TG were found to be 4030. Echo 11/28/20 EF 20-25% with moderate RV dysfunction and mild septal flattening. Started on DBA 2.5. cMRI c/w prior myocarditis vs sarcoid (see below). Mod pericardial effusion also noted. LVEF 29%. RV normal. She underwent R/LHC showing single vessel RCA occlusion, RA 1, LVEDP 17, Fick CO 5.6/CI 3.88. She was able to be weaned off inotropes and GDMT titrated.     Post hospital follow up she was hypervolemic, ReDs 43%. She followed up with Dr. Fredrica with [facility] 9/22 for a 2nd opinion. He placed LifeVest and switched beta blocker to Toprol  XL.   Echo 10/22: EF up to 45-50%, grade II DD, RV ok. LifeVest off.   Treated in ED 10/22 for pancreatitis, then admitted 11/22 for hyperglycemia.   Echo 12/22 EF 45%, RV normal.    Follow up 5/23, weight up 16 lbs, ReDs 27%. Arranged for RHC to better assess hemodynamics and see if abdominal symptoms related to right-sided HF due to high-output state or non-cardiac. This showed elevated filling pressures and normal cardiac ouput. Mild to moderate mixed PH with prominent v-waves in RA, suggestive of significant TR. Given IV lasix  and metolazone  2.5 added weekly to diuretic regimen.    Admitted 5/23 with AKI and hyperkalemia. Given IVF, insulin , calcium  gluconate and Lokelma . Echo showed EF 45-50%, RV normal, mild-mod MR, RVSP 30. Only mild TR. Restarted on torsemide  40 bid (lower dose) and Entresto  49/51 bid. Arranged paramedicine,  discharged home 143 lbs.   Seen 7/23 at [facility] for 2nd opinion, felt to be congested and recommended increasing torsemide  to 60 mg bid and instructed f/u in 3 months.   Received Furoscix  8/4, 8/5, and 10/31/21.  Admitted 9/23 for volume overload in setting of RV failure and AKI. Required milrinone . Underwent Cardiomems implant. RHC showed RA 15, PA 37/23 (28), PCWP 16, Fick CO/CI 4.2/2.6, Thermo CO/CI 3.7/2.3, PVR 4.3, PA sat 51%, 54%, PAPi 0.93  Previously being followed by Dr. Kimble at [facility]. Had renal biopsy 10/23 which showed DM and HTN nephropathy. They discussed starting HD soon. Now being followed by [facility].   Echo 10/04/22 EF 55% RV ok. Mild AI   Continued to have multiple hospitalizations and ED visits for hyperglycemia, hyperTG (>5,000), uncontrolled HTN, and abdominal pain.   Admitted 9/25 for a/c CHF in setting of poor med compliance. Echo EF 45-50%, RV mildly reduced. She was diuresed w/ IV Lasix  gtt and metolazone , sCr bumped to >3.0. Underwent RHC which showed RA 6, PA 46/23 (mean 31), PCWP 13, FICK CI 1.8, TPG 18, PVR 6.4 WU, PAPi 3.8, severely elevated SVR 2857. Significant respiratory variation, but cardiomems correlates well with RHC numbers. Of note it was recommended, when evaluating cardiomems, would look at the PA tracings and use the end expiratory values when looking at [facility]. GDMT titrated and she was re-enrolled in paramedicine. Discharged home, weight 139 lbs.  Admitted 12/28/23, sent from AHF clinic, for a/c dHF. She was diuresed with IV lasix  and discharged home, weight 145 lbs.  Today she returns for follow up. Still working with [place], paramedic. Doing ok. Compliant with meds. Haing L foot pain and worried about gout. Fluid has been ok. BP running high.   Cardiomems 11/11 PAD 23 (goal 24)   ROS: All systems reviewed and negative except as per HPI.   Past Medical History:  Diagnosis Date   Afib (HCC) 05/12/2021   Brain tumor (HCC) 03/29/1995   astrocytoma    CHF (congestive heart failure) (HCC)    Cholesterosis    CKD (chronic kidney disease) stage 4, GFR 15-29 ml/min (HCC) 05/13/2021   DM (diabetes mellitus) (HCC) 10/10/2018   Fatty liver    Gastroparesis due to DM (HCC) 05/12/2021   HTN (hypertension) 10/10/2018   Hypothyroidism 10/10/2018   Lipoprotein deficiency    Lung disease    longevity long term   Pancreatitis    Polycystic ovary syndrome    Current Outpatient Medications  Medication Sig Dispense Refill   albuterol  (VENTOLIN  HFA) 108 (90 Base) MCG/ACT inhaler Inhale 2 puffs into the lungs daily as needed for wheezing or shortness of breath.     amitriptyline  (ELAVIL ) 10 MG tablet Take 10 mg by mouth.     aspirin  EC 81 MG tablet Take 1 tablet (81 mg total) by mouth daily. Swallow whole. 30 tablet 1   atorvastatin  (LIPITOR) 80 MG tablet Take 1 tablet (80 mg total) by mouth every evening. 30 tablet 0   azelastine  (ASTELIN ) 0.1 % nasal spray Place 2 sprays into both nostrils daily as needed for rhinitis or allergies.     BAQSIMI ONE PACK 3 MG/DOSE POWD Place 1 Dose into the nose once as needed (hypoglycemia).     calcitRIOL  (ROCALTROL ) 0.25 MCG capsule Take 0.25 mcg by mouth daily.     dicyclomine  (BENTYL ) 20 MG tablet Take 1 tablet (20 mg total) by mouth 2 (two) times daily. 20 tablet 0   DULoxetine  (CYMBALTA ) 60 MG capsule Take 60 mg by mouth at bedtime.     ergocalciferol  (VITAMIN D2) 1.25 MG (50000 UT) capsule Take 1 capsule (50,000 Units total) by mouth every Sunday. 4 capsule 0   fenofibrate  54 MG tablet Take 1 tablet (54 mg total) by mouth daily. 30 tablet 0   Glucose Blood (BLOOD GLUCOSE TEST STRIPS) STRP 1 each by Does not apply route 3 (three) times daily. Use as directed to check blood sugar. May dispense any manufacturer covered by patient's insurance and fits patient's device. 100 strip 0   HUMALOG  KWIKPEN 100 UNIT/ML KwikPen Inject 60 Units into the skin in the morning, at noon, and at bedtime.     HUMULIN  R U-500 KWIKPEN  500 UNIT/ML KwikPen Inject 300 Units into the skin in the morning and at bedtime.     hydrALAZINE  (APRESOLINE ) 100 MG tablet Take 1 tablet (100 mg total) by mouth every 8 (eight) hours. 90 tablet 0   Insulin  Pen Needle (PEN NEEDLES) 31G X 5 MM MISC Use as directed 3 (three) times daily. 100 each 0   isosorbide  mononitrate (IMDUR ) 60 MG 24 hr tablet Take 1 tablet (60 mg total) by mouth daily. 30 tablet 0   ivabradine  (CORLANOR ) 7.5 MG TABS tablet Take 1 tablet (7.5 mg total) by mouth 2 (two) times daily with a meal. 60 tablet 11   Lancet Device MISC 1 each by Does not apply route 3 (three) times daily. May dispense any manufacturer covered by patient's insurance. 1 each 0   Lancets MISC 1 each by Does not apply  route 3 (three) times daily. Use as directed to check blood sugar. May dispense any manufacturer covered by patient's insurance and fits patient's device. 100 each 0   levocetirizine (XYZAL ) 5 MG tablet Take 5 mg by mouth at bedtime.     levothyroxine  (SYNTHROID ) 150 MCG tablet Take 150 mcg by mouth daily before breakfast.     pregabalin  (LYRICA ) 300 MG capsule Take 300 mg by mouth at bedtime.     promethazine  (PHENERGAN ) 25 MG tablet Take 25 mg by mouth at bedtime.     Safety Seal Miscellaneous MISC Hormonic Hair Solution with minoxidil USP 7% and finasteride USP 0.05% - apply to affected areas daily every morning. 30 mL 2   SUMAtriptan  (IMITREX ) 100 MG tablet Take 100 mg by mouth daily as needed.     torsemide  (DEMADEX ) 20 MG tablet Take 3 tablets (60 mg total) by mouth daily. 90 tablet 2   tretinoin  (RETIN-A ) 0.05 % cream Apply 1 Application topically at bedtime.     Atogepant 60 MG TABS Take 60 mg by mouth. (Patient not taking: Reported on 02/07/2024)     lansoprazole  (PREVACID ) 30 MG capsule TAKE 1 CAPSULE (30 MG TOTAL) BY MOUTH 2 (TWO) TIMES DAILY BEFORE A MEAL. (Patient not taking: Reported on 02/07/2024) 60 capsule 2   No current facility-administered medications for this encounter.    Allergies  Allergen Reactions   Icosapent  Ethyl (Epa Ethyl Ester) (Fish) Hives   Ketamine Other (See Comments)    In a vegetative state for 15 minutes, per pt   Maitake Itching and Other (See Comments)    Itchy throat   Morphine  Hives, Itching, Rash and Other (See Comments)    Sore throat, too   Mushroom Other (See Comments)    Pt has never had mushrooms but tested positive on allergy test   Penicillins Hives, Itching and Rash    Tolerates cephalosporins   Shellfish Allergy Other (See Comments)    Pt has never had shellfish, but tested positive on allergy test. Pt states contrast in CT is okay.   Fentanyl  Nausea And Vomiting   Prednisone Rash   Social History   Socioeconomic History   Marital status: Divorced    Spouse name: Not on file   Number of children: 0   Years of education: Not on file   Highest education level: Not on file  Occupational History   Occupation: Dance Movement Psychotherapist   Occupation: N/A  Tobacco Use   Smoking status: Never   Smokeless tobacco: Never  Vaping Use   Vaping status: Never Used  Substance and Sexual Activity   Alcohol  use: Never   Drug use: Never   Sexual activity: Yes  Other Topics Concern   Not on file  Social History Narrative   Not on file   Social Drivers of Health   Financial Resource Strain: Medium Risk (01/22/2024)   Received from [facility]   Overall Financial Resource Strain (CARDIA)    How hard is it for you to pay for the very basics like food, housing, medical care, and heating?: Somewhat hard  Food Insecurity: Food Insecurity Present (01/22/2024)   Received from [facility]   Hunger Vital Sign    Within the past 12 months, you worried that your food would run out before you got the money to buy more.: Sometimes true    Within the past 12 months, the food you bought just didn't last and you didn't have money to get more.: Sometimes true  Transportation  Needs: Unmet Transportation Needs (01/22/2024)   Received from  [facility] - Transportation    Lack of Transportation (Medical): Patient declined    In the past 12 months, has lack of transportation kept you from meetings, work, or from getting things needed for daily living?: Yes  Physical Activity: Inactive (01/22/2024)   Received from [facility]   Exercise Vital Sign    On average, how many days per week do you engage in moderate to strenuous exercise (like a brisk walk)?: 0 days    Minutes of Exercise per Session: Not on file  Stress: No Stress Concern Present (01/22/2024)   Received from [facility] - Occupational Stress Questionnaire    Do you feel stress - tense, restless, nervous, or anxious, or unable to sleep at night because your mind is troubled all the time - these days?: Not at all  Social Connections: Socially Integrated (01/22/2024)   Received from [facility]   Social Network    How would you rate your social network (family, work, friends)?: Good participation with social networks  Intimate Partner Violence: Not At Risk (01/22/2024)   Received from [facility]   HITS    Over the last 12 months how often did your partner physically hurt you?: Never    Over the last 12 months how often did your partner insult you or talk down to you?: Never    Over the last 12 months how often did your partner threaten you with physical harm?: Never    Over the last 12 months how often did your partner scream or curse at you?: Never   Family History  Problem Relation Age of Onset   Diabetes Mother    Hypertension Mother    Hyperlipidemia Mother    Thyroid  disease Mother    Hypertension Father    Diabetes Father    Pancreatic cancer Paternal Aunt    Pancreatic cancer Paternal Uncle    Colon cancer Neg Hx    Esophageal cancer Neg Hx    Stomach cancer Neg Hx    Wt Readings from Last 3 Encounters:  02/07/24 69.2 kg (152 lb 9.6 oz)  02/05/24 68.5 kg (151 lb)  01/29/24 64.4 kg  (142 lb)   BP (!) 160/70   Pulse (!) 104   Wt 69.2 kg (152 lb 9.6 oz)   SpO2 97%   BMI 33.02 kg/m   Physical Exam  General:  Sitting up. No resp difficulty HEENT: normal Neck: supple. no JVD.  Cor: Regular tachy 2/6 TR Lungs: clear Abdomen: obese soft, nontender, nondistended.Good bowel sounds. Extremities: no cyanosis, clubbing, rash, edema Neuro: alert & orientedx3, cranial nerves grossly intact. moves all 4 extremities w/o difficulty. Affect pleasant   ECG (personally reviewed): n/a  Cardiomems (personally reviewed): PAD 23, (goal 24)  ReDs reading: n/a  ASSESSMENT & PLAN: Nonischemic cardiomyopathy, recovered EF, previously with HFrEF (EF 29%) - prev reduced EF to 29%.  - Echo 9/25: EF 45-50%, RV mildly reduced - RHC 9/25: RA 6, PA 46/23 (mean 31), PCWP 13, FICK CI 1.8, TPG 18, PVR 6.4 WU, PAPi 3.8, severely elevated SVR 2857 - Stable NYHA III (multifactorial) Volume ok on exam and cardiomems - GDMT limited by CKD - Continue torsemide  60 mg daily, OK to take extra 20 mg PRN - Continue hydralazine  100 mg tid + Imdur  60 mg daily - Continue ivabradine  7.5 mg bid. - No beta blocker with low CI on  RHC - Labs today - Continue Paramedicine  2. CAD - Single vessel RCA occlusion (small co-dominant vessel) on cardiac cath 09/08. - No s/s angina - Continue medical management   3.  CKD IV - Scr baseline variable with frequent AKIs  - Previously followed by Dr. Kimble (Nephrology at [facility]) - Had renal biopsy (10/23) and showed DM and HTN nephropathy.  - Now followed by [facility] - Baseline SCr now ~ 3 - Labs today   4. Pulmonary hypertension - Mild to moderate on RHC 5/23. PVR 3.7 WU. - RHC 9/23: RA 15, PA 37/23 (28), PCWP 16,  Thermo CO/CI 3.7/2.3, PVR 4.3, PA sat 51%, 54%, PAPi 0.93  - RHC 9/25 RA 6, PA 46/23 (mean 31), PCWP 13, FICK CI 1.8, TPG 18, PVR 6.4 WU, PAPi 3.8, severely elevated SVR 2857 - Echo 9/25, RV mildly reduced  - V/Q negative - Off sildenafil  d/t  dizziness and low BP - No change   5. TR - Prominent v-waves in RA tracing on RHC 5/23. - prev stable mild/mod - Trivial on echo 9/23  - Mild on echo 9/25  6. Uncontrolled HTN - BP elevated - Add doxazosin 2mg    7. OSA - Unable to tolerate CPAP mask - Follows with [facility]  8. Severe hyperTG - Due to LPL deficiency w/ recent pancreatitis. - Follows in [facility] (Dr. Mona)  - Continue fenofibrate  + atorvastatin   9. Type 3c DM - 2/2 chronic pancreatitis  - Follows with [facility] - On insulin   10. Chronic abdominal swelling/bloating - Seen at [facility] (11/20) for IBS, per chart review. - Had penumatosis intestinalis. She was started on pancreatic enzyme replacement. - Has DM gastroparesis - Suspect primarily fatty liver - no change  11. Poor Med Compliance/SDOH - Doing better with paramedicine back on board - Appreciate their assistance  12. Left foot pain - pain mostly over 5th metatarsal on exam. Doesn't look clearly like gout - check uric acid   Toribio Fuel, MD  3:45 PM

## 2024-02-08 ENCOUNTER — Ambulatory Visit: Admitting: Podiatry

## 2024-02-08 ENCOUNTER — Emergency Department (HOSPITAL_COMMUNITY): Admission: EM | Admit: 2024-02-08 | Discharge: 2024-02-08 | Disposition: A | Discharge status: Home / Self Care

## 2024-02-08 ENCOUNTER — Other Ambulatory Visit: Payer: Self-pay

## 2024-02-08 ENCOUNTER — Encounter (HOSPITAL_COMMUNITY): Payer: Self-pay

## 2024-02-08 DIAGNOSIS — Z85841 Personal history of malignant neoplasm of brain: Secondary | ICD-10-CM | POA: Diagnosis not present

## 2024-02-08 DIAGNOSIS — M10372 Gout due to renal impairment, left ankle and foot: Secondary | ICD-10-CM | POA: Diagnosis not present

## 2024-02-08 DIAGNOSIS — M7989 Other specified soft tissue disorders: Secondary | ICD-10-CM | POA: Diagnosis not present

## 2024-02-08 DIAGNOSIS — E1122 Type 2 diabetes mellitus with diabetic chronic kidney disease: Secondary | ICD-10-CM | POA: Insufficient documentation

## 2024-02-08 DIAGNOSIS — M65972 Unspecified synovitis and tenosynovitis, left ankle and foot: Secondary | ICD-10-CM | POA: Diagnosis not present

## 2024-02-08 DIAGNOSIS — Z794 Long term (current) use of insulin: Secondary | ICD-10-CM | POA: Diagnosis not present

## 2024-02-08 DIAGNOSIS — I130 Hypertensive heart and chronic kidney disease with heart failure and stage 1 through stage 4 chronic kidney disease, or unspecified chronic kidney disease: Secondary | ICD-10-CM | POA: Insufficient documentation

## 2024-02-08 DIAGNOSIS — N189 Chronic kidney disease, unspecified: Secondary | ICD-10-CM | POA: Insufficient documentation

## 2024-02-08 DIAGNOSIS — M79672 Pain in left foot: Secondary | ICD-10-CM | POA: Insufficient documentation

## 2024-02-08 DIAGNOSIS — I509 Heart failure, unspecified: Secondary | ICD-10-CM | POA: Insufficient documentation

## 2024-02-08 MED ORDER — HYDROMORPHONE HCL 1 MG/ML IJ SOLN
0.5000 mg | Freq: Once | INTRAMUSCULAR | Status: AC
  Administered 2024-02-08: 0.5 mg via INTRAMUSCULAR
  Filled 2024-02-08: qty 1

## 2024-02-08 MED ORDER — METHYLPREDNISOLONE 4 MG PO TBPK: ORAL_TABLET | ORAL | 0 refills | Status: DC

## 2024-02-08 NOTE — ED Triage Notes (Signed)
 Pt arrives via EMS from home. Pt reports gout flair up to left foot for the past two days. States she was supposed to see a podiatrist today, but states the pain is too bad. PT AxOx4.

## 2024-02-08 NOTE — Progress Notes (Signed)
 Subjective:  Patient ID: Jennifer [name], female    DOB: 1990-11-01,  MRN: [id number]  Chief Complaint  Patient presents with   Foot Pain    Pt stated that she saw dr magdalen on 10/20 for her foot pain he gave her a shot but she is still having some discomfort     33 y.o. female presents with the above complaint.  Patient presents with complaint of left ankle pain that has been off for quite some time is progressing and worsens with ambulation and shoe pressure she has a history of gout she thinks it may be gout flare in the ankle she had injection in the past for the first MTP.  She denies any other acute concerns she is a diabetic has a history of fatty liver disease.  She would like to discuss treatment options for this.   Review of Systems: Negative except as noted in the HPI. Denies N/V/F/Ch.  Past Medical History:  Diagnosis Date   Afib (HCC) 05/12/2021   Brain tumor (HCC) 03/29/1995   astrocytoma   CHF (congestive heart failure) (HCC)    Cholesterosis    CKD (chronic kidney disease) stage 4, GFR 15-29 ml/min (HCC) 05/13/2021   DM (diabetes mellitus) (HCC) 10/10/2018   Fatty liver    Gastroparesis due to DM (HCC) 05/12/2021   HTN (hypertension) 10/10/2018   Hypothyroidism 10/10/2018   Lipoprotein deficiency    Lung disease    longevity long term   Pancreatitis    Polycystic ovary syndrome     Current Outpatient Medications:    methylPREDNISolone  (MEDROL  DOSEPAK) 4 MG TBPK tablet, Take as directed, Disp: 21 each, Rfl: 0   albuterol  (VENTOLIN  HFA) 108 (90 Base) MCG/ACT inhaler, Inhale 2 puffs into the lungs daily as needed for wheezing or shortness of breath., Disp: , Rfl:    amitriptyline  (ELAVIL ) 10 MG tablet, Take 10 mg by mouth., Disp: , Rfl:    aspirin  EC 81 MG tablet, Take 1 tablet (81 mg total) by mouth daily. Swallow whole., Disp: 30 tablet, Rfl: 1   Atogepant 60 MG TABS, Take 60 mg by mouth. (Patient not taking: Reported on 02/07/2024), Disp: , Rfl:     atorvastatin  (LIPITOR) 80 MG tablet, Take 1 tablet (80 mg total) by mouth every evening., Disp: 30 tablet, Rfl: 0   azelastine  (ASTELIN ) 0.1 % nasal spray, Place 2 sprays into both nostrils daily as needed for rhinitis or allergies., Disp: , Rfl:    BAQSIMI ONE PACK 3 MG/DOSE POWD, Place 1 Dose into the nose once as needed (hypoglycemia)., Disp: , Rfl:    calcitRIOL  (ROCALTROL ) 0.25 MCG capsule, Take 0.25 mcg by mouth daily., Disp: , Rfl:    dicyclomine  (BENTYL ) 20 MG tablet, Take 1 tablet (20 mg total) by mouth 2 (two) times daily., Disp: 20 tablet, Rfl: 0   doxazosin (CARDURA) 2 MG tablet, Take 1 tablet (2 mg total) by mouth daily., Disp: 90 tablet, Rfl: 3   DULoxetine  (CYMBALTA ) 60 MG capsule, Take 60 mg by mouth at bedtime., Disp: , Rfl:    ergocalciferol  (VITAMIN D2) 1.25 MG (50000 UT) capsule, Take 1 capsule (50,000 Units total) by mouth every Sunday., Disp: 4 capsule, Rfl: 0   fenofibrate  54 MG tablet, Take 1 tablet (54 mg total) by mouth daily., Disp: 30 tablet, Rfl: 0   Glucose Blood (BLOOD GLUCOSE TEST STRIPS) STRP, 1 each by Does not apply route 3 (three) times daily. Use as directed to check blood sugar. May dispense any manufacturer  covered by patient's insurance and fits patient's device., Disp: 100 strip, Rfl: 0   HUMALOG  KWIKPEN 100 UNIT/ML KwikPen, Inject 60 Units into the skin in the morning, at noon, and at bedtime., Disp: , Rfl:    HUMULIN  R U-500 KWIKPEN 500 UNIT/ML KwikPen, Inject 300 Units into the skin in the morning and at bedtime., Disp: , Rfl:    hydrALAZINE  (APRESOLINE ) 100 MG tablet, Take 1 tablet (100 mg total) by mouth every 8 (eight) hours., Disp: 90 tablet, Rfl: 0   Insulin  Pen Needle (PEN NEEDLES) 31G X 5 MM MISC, Use as directed 3 (three) times daily., Disp: 100 each, Rfl: 0   isosorbide  mononitrate (IMDUR ) 60 MG 24 hr tablet, Take 1 tablet (60 mg total) by mouth daily., Disp: 30 tablet, Rfl: 0   ivabradine  (CORLANOR ) 7.5 MG TABS tablet, Take 1 tablet (7.5 mg total)  by mouth 2 (two) times daily with a meal., Disp: 60 tablet, Rfl: 11   Lancet Device MISC, 1 each by Does not apply route 3 (three) times daily. May dispense any manufacturer covered by patient's insurance., Disp: 1 each, Rfl: 0   Lancets MISC, 1 each by Does not apply route 3 (three) times daily. Use as directed to check blood sugar. May dispense any manufacturer covered by patient's insurance and fits patient's device., Disp: 100 each, Rfl: 0   lansoprazole  (PREVACID ) 30 MG capsule, TAKE 1 CAPSULE (30 MG TOTAL) BY MOUTH 2 (TWO) TIMES DAILY BEFORE A MEAL. (Patient not taking: Reported on 02/07/2024), Disp: 60 capsule, Rfl: 2   levocetirizine (XYZAL ) 5 MG tablet, Take 5 mg by mouth at bedtime., Disp: , Rfl:    levothyroxine  (SYNTHROID ) 150 MCG tablet, Take 150 mcg by mouth daily before breakfast., Disp: , Rfl:    pregabalin  (LYRICA ) 300 MG capsule, Take 300 mg by mouth at bedtime., Disp: , Rfl:    promethazine  (PHENERGAN ) 25 MG tablet, Take 25 mg by mouth at bedtime., Disp: , Rfl:    Safety Seal Miscellaneous MISC, Hormonic Hair Solution with minoxidil USP 7% and finasteride USP 0.05% - apply to affected areas daily every morning., Disp: 30 mL, Rfl: 2   SUMAtriptan  (IMITREX ) 100 MG tablet, Take 100 mg by mouth daily as needed., Disp: , Rfl:    torsemide  (DEMADEX ) 20 MG tablet, Take 3 tablets (60 mg total) by mouth daily., Disp: 90 tablet, Rfl: 2   tretinoin  (RETIN-A ) 0.05 % cream, Apply 1 Application topically at bedtime., Disp: , Rfl:   Social History   Tobacco Use  Smoking Status Never  Smokeless Tobacco Never    Allergies  Allergen Reactions   Icosapent  Ethyl (Epa Ethyl Ester) (Fish) Hives   Ketamine Other (See Comments)    In a vegetative state for 15 minutes, per pt   Maitake Itching and Other (See Comments)    Itchy throat   Morphine  Hives, Itching, Rash and Other (See Comments)    Sore throat, too   Mushroom Other (See Comments)    Pt has never had mushrooms but tested positive  on allergy test   Penicillins Hives, Itching and Rash    Tolerates cephalosporins   Shellfish Allergy Other (See Comments)    Pt has never had shellfish, but tested positive on allergy test. Pt states contrast in CT is okay.   Fentanyl  Nausea And Vomiting   Prednisone Rash   Objective:  There were no vitals filed for this visit. There is no height or weight on file to calculate BMI. Constitutional Well developed.  Well nourished.  Vascular Dorsalis pedis pulses palpable bilaterally. Posterior tibial pulses palpable bilaterally. Capillary refill normal to all digits.  No cyanosis or clubbing noted. Pedal hair growth normal.  Neurologic Normal speech. Oriented to person, place, and time. Epicritic sensation to light touch grossly present bilaterally.  Dermatologic Nails well groomed and normal in appearance. No open wounds. No skin lesions.  Orthopedic: Pain on palpation to the left ankle pain with range of motion of the joint where it had swollen joint noted.  No open wounds or lesion noted.  No pain anywhere else.  Pain localized to the ankle joint no pain at the Achilles tendon peroneal tendon posterior tibial tendon ATFL ligament   Radiographs: None Assessment:   1. Acute gout due to renal impairment involving toe of left foot   2. Synovitis of left ankle    Plan:  Patient was evaluated and treated and all questions answered.  Left ankle synovitis with underlying history of gout/gout flare - All questions and concerns were discussed with the patient extensive detail given the amount of pain that she is having should benefit from steroid injection of decreasing inflammatory, surgical pain.  Patient agrees with plan like to proceed with steroid injection -A steroid injection was performed at left ankle using 1% plain Lidocaine  and 10 mg of Kenalog. This was well tolerated. - Discussed diet management - Medrol  Dosepak was sent to the pharmacy as well   No follow-ups on file.

## 2024-02-08 NOTE — Discharge Instructions (Signed)
 Your left foot pain is likely due to gout.  Please follow-up closely with your podiatrist as scheduled today for further definitive management.

## 2024-02-08 NOTE — ED Provider Notes (Signed)
 [place] [facility] AT [facility] Provider Note   CSN: [id number] Arrival date & time: 02/08/24  1002     Patient presents with: Gout   Jennifer [name] is a 33 y.o. female.   The history is provided by the patient, medical records and the EMS personnel. No language interpreter was used.     33 year old female with history of gout, diabetes, hypertension, brain tumor, A-fib not on anticoagulants, CKD, CHF who presents with complaint of foot pain.  Patient states she has been dealing with left foot pain ongoing for the past 3 days.  Pain is sharp throbbing achy persistent worse with any kind of pressure and felt very similar to the gout pain that she had 3 weeks ago.  States that 3 weeks ago if she did follow-up with a podiatrist and got a injection into her foot that provided relief but now the pain returns.  She does have an appointment with her podiatrist today but due to the persistent pain that she wanted to come to the ER for management.  She denies any specific treatment tried at home she denies any fever no recent injury denies any calf tenderness or leg swelling.  The pain is primarily to her foot and great toe.  Prior to Admission medications   Medication Sig Start Date End Date Taking? Authorizing Provider  albuterol  (VENTOLIN  HFA) 108 (90 Base) MCG/ACT inhaler Inhale 2 puffs into the lungs daily as needed for wheezing or shortness of breath. 10/03/23   [provider]  amitriptyline  (ELAVIL ) 10 MG tablet Take 10 mg by mouth. 02/01/24   [provider]  aspirin  EC 81 MG tablet Take 1 tablet (81 mg total) by mouth daily. Swallow whole. 12/18/23   [name], Toribio SAUNDERS, MD  Atogepant 60 MG TABS Take 60 mg by mouth. Patient not taking: Reported on 02/07/2024 02/01/24   [provider]  atorvastatin  (LIPITOR) 80 MG tablet Take 1 tablet (80 mg total) by mouth every evening. 01/02/24   Arrien, Mauricio Daniel, MD  azelastine  (ASTELIN )  0.1 % nasal spray Place 2 sprays into both nostrils daily as needed for rhinitis or allergies.    [provider]  BAQSIMI ONE PACK 3 MG/DOSE POWD Place 1 Dose into the nose once as needed (hypoglycemia). 07/31/23   [provider]  calcitRIOL  (ROCALTROL ) 0.25 MCG capsule Take 0.25 mcg by mouth daily. 08/04/23   [provider]  dicyclomine  (BENTYL ) 20 MG tablet Take 1 tablet (20 mg total) by mouth 2 (two) times daily. 09/05/23   Dreama Longs, MD  doxazosin (CARDURA) 2 MG tablet Take 1 tablet (2 mg total) by mouth daily. 02/07/24   [name], Toribio SAUNDERS, MD  DULoxetine  (CYMBALTA ) 60 MG capsule Take 60 mg by mouth at bedtime. 09/26/22   [provider]  ergocalciferol  (VITAMIN D2) 1.25 MG (50000 UT) capsule Take 1 capsule (50,000 Units total) by mouth every Sunday. 11/12/23   Lee, Jordan, NP  fenofibrate  54 MG tablet Take 1 tablet (54 mg total) by mouth daily. 01/03/24   Arrien, Elidia Toribio, MD  Glucose Blood (BLOOD GLUCOSE TEST STRIPS) STRP 1 each by Does not apply route 3 (three) times daily. Use as directed to check blood sugar. May dispense any manufacturer covered by patient's insurance and fits patient's device. 08/26/23   Shalhoub, Zachary PARAS, MD  HUMALOG  KWIKPEN 100 UNIT/ML KwikPen Inject 60 Units into the skin in the morning, at noon, and at bedtime.    [provider]  HUMULIN  R U-500 KWIKPEN 500 UNIT/ML KwikPen Inject 300 Units into the skin in the morning and at bedtime.    [provider]  hydrALAZINE  (APRESOLINE ) 100 MG tablet Take 1 tablet (100 mg total) by mouth every 8 (eight) hours. 01/02/24   Arrien, Elidia Sieving, MD  Insulin  Pen Needle (PEN NEEDLES) 31G X 5 MM MISC Use as directed 3 (three) times daily. 08/26/23   Shalhoub, Zachary PARAS, MD  isosorbide  mononitrate (IMDUR ) 60 MG 24 hr tablet Take 1 tablet (60 mg total) by mouth daily. 01/03/24   Arrien, Mauricio Daniel, MD  ivabradine  (CORLANOR ) 7.5 MG TABS tablet Take 1 tablet (7.5 mg  total) by mouth 2 (two) times daily with a meal. 01/08/24   Milford, Harlene HERO, FNP  Lancet Device MISC 1 each by Does not apply route 3 (three) times daily. May dispense any manufacturer covered by patient's insurance. 08/26/23   Shalhoub, Zachary PARAS, MD  Lancets MISC 1 each by Does not apply route 3 (three) times daily. Use as directed to check blood sugar. May dispense any manufacturer covered by patient's insurance and fits patient's device. 08/26/23   Shalhoub, Zachary PARAS, MD  lansoprazole  (PREVACID ) 30 MG capsule TAKE 1 CAPSULE (30 MG TOTAL) BY MOUTH 2 (TWO) TIMES DAILY BEFORE A MEAL. Patient not taking: Reported on 02/07/2024 02/05/24   May, Deanna J, NP  levocetirizine (XYZAL ) 5 MG tablet Take 5 mg by mouth at bedtime. 03/05/22   [provider]  levothyroxine  (SYNTHROID ) 150 MCG tablet Take 150 mcg by mouth daily before breakfast.    [provider]  pregabalin  (LYRICA ) 300 MG capsule Take 300 mg by mouth at bedtime.    [provider]  promethazine  (PHENERGAN ) 25 MG tablet Take 25 mg by mouth at bedtime.    [provider]  Safety Seal Miscellaneous MISC Hormonic Hair Solution with minoxidil USP 7% and finasteride USP 0.05% - apply to affected areas daily every morning. 06/06/23   Alm Delon SAILOR, DO  SUMAtriptan  (IMITREX ) 100 MG tablet Take 100 mg by mouth daily as needed. 10/11/23   [provider]  torsemide  (DEMADEX ) 20 MG tablet Take 3 tablets (60 mg total) by mouth daily. 12/16/23   Zenaida Morene PARAS, MD  tretinoin  (RETIN-A ) 0.05 % cream Apply 1 Application topically at bedtime.    [provider]    Allergies: Icosapent  ethyl (epa ethyl ester) (fish), Ketamine, [place], Morphine , Mushroom, Penicillins, Shellfish allergy, Fentanyl , and Prednisone    Review of Systems  Constitutional:  Negative for fever.  Musculoskeletal:  Positive for arthralgias.  Skin:  Negative for wound.    Updated Vital Signs BP (!) 170/96 (BP Location:  Right Arm)   Pulse (!) 110   Temp 98.9 F (37.2 C) (Oral)   Resp 16   SpO2 99%   Physical Exam Vitals and nursing note reviewed.  Constitutional:      General: [name] not in acute distress.    Appearance: [name] well-developed.     Comments: Small stature than stated age.  Evidence of hirsutism  HENT:     Head: Atraumatic.  Eyes:     Conjunctiva/sclera: Conjunctivae normal.  Pulmonary:     Effort: Pulmonary effort is normal.  Musculoskeletal:        General: Tenderness (Left foot: Tenderness to palpation of the left great toe and the sole of the foot with some mild erythema noted and some mild swelling noted.  Intact with status pedis pulse, no obvious deformity, left  calf nontender, sensations intact.) present.     Cervical back: Neck supple.  Skin:    Findings: No rash.  Neurological:     Mental Status: [name] alert.  Psychiatric:        Mood and Affect: Mood normal.     (all labs ordered are listed, but only abnormal results are displayed) Labs Reviewed - No data to display  EKG: None  Radiology: No results found.   Procedures   Medications Ordered in the ED - No data to display                                  Medical Decision Making  BP (!) 170/96 (BP Location: Right Arm)   Pulse (!) 110   Temp 98.9 F (37.2 C) (Oral)   Resp 16   SpO2 99%   68:83 PM 33 year old female with history of gout, diabetes, hypertension, brain tumor, A-fib not on anticoagulants, CKD, CHF who presents with complaint of foot pain.  Patient states she has been dealing with left foot pain ongoing for the past 3 days.  Pain is sharp throbbing achy persistent worse with any kind of pressure and felt very similar to the gout pain that she had 3 weeks ago.  States that 3 weeks ago if she did follow-up with a podiatrist and got a injection into her foot that provided relief but now the pain returns.  She does have an appointment with her podiatrist today but due to the persistent pain  that she wanted to come to the ER for management.  She denies any specific treatment tried at home she denies any fever no recent injury denies any calf tenderness or leg swelling.  The pain is primarily to her foot and great toe.  Exam notable for tenderness to the left foot and left great toe with some mild erythema and edema noted.  No obvious signs of injury no deformity.  Patient is neurovascular intact.  Pain is likely secondary to gout.  Doubt cellulitis, septic joint, fracture, dislocation, strain, sprain, or DVT.  Will provide patient with pain medication but will encourage patient to follow-up with her podiatry in 1 hour as previously scheduled for more definitive management of her foot pain.  Do not think DVT study is indicated at this time.      Final diagnoses:  Left foot pain    ED Discharge Orders     None          Nivia Colon, PA-C 02/08/24 1234    Simon Lavonia SAILOR, MD 02/08/24 1520

## 2024-02-12 ENCOUNTER — Other Ambulatory Visit (HOSPITAL_COMMUNITY): Payer: Self-pay

## 2024-02-12 MED ORDER — ATORVASTATIN CALCIUM 80 MG PO TABS: 80.0000 mg | ORAL_TABLET | Freq: Every evening | ORAL | 0 refills | Status: DC

## 2024-02-12 NOTE — Progress Notes (Signed)
 Paramedicine Encounter    Patient ID: Jennifer [name], female    DOB: [birth year], 33 y.o.   MRN: [id number]   Complaints-foot pain , but improving   Edema-none   Compliance with meds-yes -missed one am dose of her meds last week  Pill box filled-yes If so, by whom-paramedic   Refills needed-atorvastatin -toc pharm didn't add refills on rx -sent message to triage ref this   She has 2 tablets left- so hopefully they can get it filled soon and sent out.     Pt had to go to ER last week due to severe pain in her foot. Diagnosed with gout. She thinks its dairy that causes the gout flare.  She was then able to be seen at foot doc that afternoon and gave her steriod. Pt denies increased sob, no dizziness, no c/p.  No palpitations.  B/p elevated. Has not started the doxazosin yet. Will start that today-she is due for her am meds right now.  She could not use her cardiomems last week due to the foot pain. But that is improving for her and she is able to walk now and used cardiomems this morning.  No edema noted.  Appetite is ok. Eating and drinking ok. She has cut back on her fluids.   Meds verified and pill box refilled.   CBG's-160-250   BP (!) 158/80   Pulse (!) 108   Resp 16   Wt 140 lb (63.5 kg)   SpO2 97%   BMI 30.30 kg/m  Weight yesterday-140 Last visit weight-142  Patient Care Team: [name] as PCP - General (Physician Assistant) Tommas Pears, MD as PCP - Endocrinology (Endocrinology) Ladona Heinz, MD as PCP - Cardiology (Cardiology)  Patient Active Problem List   Diagnosis Date Noted   Skin infection 01/02/2024   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) (HCC) 12/28/2023   Hypertensive urgency 12/28/2023   Acute exacerbation of CHF (congestive heart failure) (HCC) 12/07/2023   Controlled type 2 diabetes mellitus with hyperglycemia (HCC) 08/31/2023   Nausea without vomiting 08/31/2023   Epigastric pain  08/31/2023   Sleep apnea 08/30/2023   Abscess 08/30/2023   SOB (shortness of breath) 08/20/2023   Dizziness 08/20/2023   Chronic pancreatitis (HCC) 08/15/2023   Witnessed episode of apnea 06/27/2023   Pseudohyponatremia 04/10/2023   Metabolic acidosis 04/10/2023   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 coli UTI (urinary tract infection) 06/01/2022   DKA (diabetic ketoacidosis) (HCC) 06/01/2022   ASCUS of cervix with negative high risk HPV 05/31/2022   Acute on chronic pancreatitis (HCC) 05/29/2022   Hypokalemia 03/23/2022   COVID 03/15/2022   COVID-19 virus infection 03/14/2022   Hyponatremia 03/14/2022   Positive D dimer 03/14/2022   Stage 3b chronic kidney disease (CKD) (HCC) 03/14/2022   Pulmonary hypertension (HCC) 02/06/2022   NASH (nonalcoholic steatohepatitis) 02/06/2022   Obesity (BMI 30-39.9) 02/06/2022   Heart failure (HCC) 12/03/2021   Acute on chronic diastolic CHF (congestive heart failure) (HCC)  12/02/2021   Elevated troponin 12/02/2021   Pancytopenia (HCC) 12/02/2021   Amenorrhea 12/02/2021   Hepatic steatosis 09/27/2021   Hyperosmolar hyperglycemic state (HHS) (HCC) 08/26/2021   Small intestinal bacterial overgrowth (SIBO) 08/26/2021   Lower extremity edema 08/26/2021   Hyperkalemia 08/14/2021   Hx of insulin  dependent diabetes mellitus 08/14/2021   CKD stage 3b, GFR 30-44 ml/min (HCC) 05/13/2021   Gastroparesis due to DM (HCC) 05/12/2021    Abdominal distension 05/12/2021   Chest pain 03/10/2021   Acute on chronic combined systolic and diastolic CHF (congestive heart failure) (HCC) 02/09/2021   History of astrocytoma of brain 02/09/2021   Type 2 diabetes mellitus with hyperglycemia, with long-term current use of insulin  (HCC) 01/25/2021   Diarrhea    Elevated transaminase level    Orthostatic hypotension 11/28/2020   Acute combined systolic and diastolic heart failure (HCC)    Nonischemic cardiomyopathy (HCC)    Acute systolic congestive heart failure (HCC)    Elevated liver enzymes    Acute renal failure superimposed on stage 3b chronic kidney disease (HCC) 11/26/2020   Intractable abdominal pain 11/23/2020   Chronic systolic congestive heart failure (HCC) 11/23/2020   Essential hypertension 11/23/2020   Familial hypertriglyceridemia 11/23/2020   Prolonged QT interval 11/23/2020   Finger mass, left 08/12/2020   Nausea and vomiting 07/23/2020   Polycystic ovary syndrome 01/30/2020   Moderate aortic insufficiency 08/16/2019   Moderate mitral regurgitation 08/16/2019   Lipoprotein lipase deficiency, familial 06/19/2019   Microalbuminuria due to type 1 diabetes mellitus (HCC) 06/19/2019   Type 2 diabetes mellitus with hyperlipidemia (HCC) 10/10/2018   Hypothyroidism 10/10/2018    Current Outpatient Medications:    albuterol  (VENTOLIN  HFA) 108 (90 Base) MCG/ACT inhaler, Inhale 2 puffs into the lungs daily as needed for wheezing or shortness of breath., Disp: , Rfl:    amitriptyline  (ELAVIL ) 10 MG tablet, Take 10 mg by mouth. (Patient taking differently: Take 10 mg by mouth at bedtime.), Disp: , Rfl:    aspirin  EC 81 MG tablet, Take 1 tablet (81 mg total) by mouth daily. Swallow whole., Disp: 30 tablet, Rfl: 1   atorvastatin  (LIPITOR) 80 MG tablet, Take 1 tablet (80 mg total) by mouth every evening., Disp: 30 tablet, Rfl: 0   azelastine  (ASTELIN ) 0.1 % nasal spray, Place 2 sprays into both nostrils daily as needed for  rhinitis or allergies., Disp: , Rfl:    calcitRIOL  (ROCALTROL ) 0.25 MCG capsule, Take 0.25 mcg by mouth daily., Disp: , Rfl:    doxazosin (CARDURA) 2 MG tablet, Take 1 tablet (2 mg total) by mouth daily., Disp: 90 tablet, Rfl: 3   DULoxetine  (CYMBALTA ) 60 MG capsule, Take 60 mg by mouth at bedtime., Disp: , Rfl:    ergocalciferol  (VITAMIN D2) 1.25 MG (50000 UT) capsule, Take 1 capsule (50,000 Units total) by mouth every Sunday., Disp: 4 capsule, Rfl: 0   fenofibrate  54 MG tablet, Take 1 tablet (54 mg total) by mouth daily. (Patient taking differently: Take 54 mg by mouth at bedtime.), Disp: 30 tablet, Rfl: 0   hydrALAZINE  (APRESOLINE ) 100 MG tablet, Take 1 tablet (100 mg total) by mouth every 8 (eight) hours., Disp: 90 tablet, Rfl: 0   isosorbide  mononitrate (IMDUR ) 60 MG 24 hr tablet, Take 1 tablet (60 mg total) by mouth daily., Disp: 30 tablet, Rfl: 0   ivabradine  (CORLANOR ) 7.5 MG TABS tablet, Take 1 tablet (7.5 mg total) by mouth 2 (two) times daily with a meal., Disp: 60 tablet, Rfl: 11   lansoprazole  (  PREVACID ) 30 MG capsule, TAKE 1 CAPSULE (30 MG TOTAL) BY MOUTH 2 (TWO) TIMES DAILY BEFORE A MEAL., Disp: 60 capsule, Rfl: 2   levocetirizine (XYZAL ) 5 MG tablet, Take 5 mg by mouth at bedtime., Disp: , Rfl:    levothyroxine  (SYNTHROID ) 150 MCG tablet, Take 150 mcg by mouth daily before breakfast., Disp: , Rfl:    pregabalin  (LYRICA ) 300 MG capsule, Take 300 mg by mouth at bedtime., Disp: , Rfl:    promethazine  (PHENERGAN ) 25 MG tablet, Take 25 mg by mouth at bedtime., Disp: , Rfl:    torsemide  (DEMADEX ) 20 MG tablet, Take 3 tablets (60 mg total) by mouth daily., Disp: 90 tablet, Rfl: 2   tretinoin  (RETIN-A ) 0.05 % cream, Apply 1 Application topically at bedtime., Disp: , Rfl:    Atogepant 60 MG TABS, Take 60 mg by mouth. (Patient not taking: Reported on 02/07/2024), Disp: , Rfl:    BAQSIMI ONE PACK 3 MG/DOSE POWD, Place 1 Dose into the nose once as needed (hypoglycemia)., Disp: , Rfl:     dicyclomine  (BENTYL ) 20 MG tablet, Take 1 tablet (20 mg total) by mouth 2 (two) times daily., Disp: 20 tablet, Rfl: 0   Glucose Blood (BLOOD GLUCOSE TEST STRIPS) STRP, 1 each by Does not apply route 3 (three) times daily. Use as directed to check blood sugar. May dispense any manufacturer covered by patient's insurance and fits patient's device., Disp: 100 strip, Rfl: 0   HUMALOG  KWIKPEN 100 UNIT/ML KwikPen, Inject 60 Units into the skin in the morning, at noon, and at bedtime., Disp: , Rfl:    HUMULIN  R U-500 KWIKPEN 500 UNIT/ML KwikPen, Inject 300 Units into the skin in the morning and at bedtime., Disp: , Rfl:    Insulin  Pen Needle (PEN NEEDLES) 31G X 5 MM MISC, Use as directed 3 (three) times daily., Disp: 100 each, Rfl: 0   Lancet Device MISC, 1 each by Does not apply route 3 (three) times daily. May dispense any manufacturer covered by patient's insurance., Disp: 1 each, Rfl: 0   Lancets MISC, 1 each by Does not apply route 3 (three) times daily. Use as directed to check blood sugar. May dispense any manufacturer covered by patient's insurance and fits patient's device., Disp: 100 each, Rfl: 0   methylPREDNISolone  (MEDROL  DOSEPAK) 4 MG TBPK tablet, Take as directed, Disp: 21 each, Rfl: 0   Safety Seal Miscellaneous MISC, Hormonic Hair Solution with minoxidil USP 7% and finasteride USP 0.05% - apply to affected areas daily every morning., Disp: 30 mL, Rfl: 2   SUMAtriptan  (IMITREX ) 100 MG tablet, Take 100 mg by mouth daily as needed., Disp: , Rfl:  Allergies  Allergen Reactions   Icosapent  Ethyl (Epa Ethyl Ester) (Fish) Hives   Ketamine Other (See Comments)    In a vegetative state for 15 minutes, per pt   Maitake Itching and Other (See Comments)    Itchy throat   Morphine  Hives, Itching, Rash and Other (See Comments)    Sore throat, too   Mushroom Other (See Comments)    Pt has never had mushrooms but tested positive on allergy test   Penicillins Hives, Itching and Rash    Tolerates  cephalosporins   Shellfish Allergy Other (See Comments)    Pt has never had shellfish, but tested positive on allergy test. Pt states contrast in CT is okay.   Fentanyl  Nausea And Vomiting   Prednisone Rash      Social History   Socioeconomic History   Marital  status: Divorced    Spouse name: Not on file   Number of children: 0   Years of education: Not on file   Highest education level: Not on file  Occupational History   Occupation: Dance Movement Psychotherapist   Occupation: N/A  Tobacco Use   Smoking status: Never   Smokeless tobacco: Never  Vaping Use   Vaping status: Never Used  Substance and Sexual Activity   Alcohol  use: Never   Drug use: Never   Sexual activity: Yes  Other Topics Concern   Not on file  Social History Narrative   Not on file   Social Drivers of Health   Financial Resource Strain: Medium Risk (01/22/2024)   Received from [facility]   Overall Financial Resource Strain (CARDIA)    How hard is it for you to pay for the very basics like food, housing, medical care, and heating?: Somewhat hard  Food Insecurity: Food Insecurity Present (01/22/2024)   Received from [facility]   Hunger Vital Sign    Within the past 12 months, you worried that your food would run out before you got the money to buy more.: Sometimes true    Within the past 12 months, the food you bought just didn't last and you didn't have money to get more.: Sometimes true  Transportation Needs: Unmet Transportation Needs (01/22/2024)   Received from [facility] - Transportation    Lack of Transportation (Medical): Patient declined    In the past 12 months, has lack of transportation kept you from meetings, work, or from getting things needed for daily living?: Yes  Physical Activity: Inactive (01/22/2024)   Received from [facility]   Exercise Vital Sign    On average, how many days per week do you engage in moderate to strenuous exercise (like a brisk walk)?: 0 days    Minutes  of Exercise per Session: Not on file  Stress: No Stress Concern Present (01/22/2024)   Received from [facility] - Occupational Stress Questionnaire    Do you feel stress - tense, restless, nervous, or anxious, or unable to sleep at night because your mind is troubled all the time - these days?: Not at all  Social Connections: Socially Integrated (01/22/2024)   Received from [facility]   Social Network    How would you rate your social network (family, work, friends)?: Good participation with social networks  Intimate Partner Violence: Not At Risk (01/22/2024)   Received from [facility]   HITS    Over the last 12 months how often did your partner physically hurt you?: Never    Over the last 12 months how often did your partner insult you or talk down to you?: Never    Over the last 12 months how often did your partner threaten you with physical harm?: Never    Over the last 12 months how often did your partner scream or curse at you?: Never    Physical Exam      Future Appointments  Date Time Provider Department Center  04/04/2024  2:30 PM [facility] PA/NP [facility] None  04/05/2024  1:00 PM Hope Almarie ORN, NP [facility] [address]  04/16/2024  8:00 PM Neda Jennet LABOR, MD [facility]  05/07/2024  3:15 PM Alm Jennifer SAILOR, DO [facility] None       Izetta Quivers, Paramedic [phone] [facility] Paramedic  02/12/24

## 2024-02-17 ENCOUNTER — Ambulatory Visit (HOSPITAL_COMMUNITY): Payer: Self-pay | Admitting: Internal Medicine

## 2024-02-19 ENCOUNTER — Other Ambulatory Visit (HOSPITAL_COMMUNITY): Payer: Self-pay

## 2024-02-19 NOTE — Progress Notes (Signed)
 Paramedicine Encounter    Patient ID: Jennifer [name], female    DOB: Apr 24, 1990, 33 y.o.   MRN: [id number]   Complaints-bloated, swelling to her legs  Edema-yes  Compliance with meds-yes  Pill box filled-yes  If so, by whom-paramedic   Refills needed- Vit d  Atorvastatin     Pt reports she is doing ok, she went to a friendsgiving yesterday and had a good time.  However her weight is up today, but she reported to not have had that much to eat from there.  She denies increased sob, no dizziness, some palpitations here and there but nothing like before.  Pt reports she did have brief episode fo c/p yesterday but went away quickly.  My computer was not working during visit,  Med rec via [employer].  Still no PA for the atogepant so that hasn't started yet.  She reports her weight is up from 143>150.  She feels bloated, she looks bloated. She has swelling to his legs.  Reached out to [facility] to review her cardiomems. It is elevated slightly but doesn't seem to correlate to her weight gain.  She does have standing order to take 1 extra torsemide  if weight gain. So she is doing that today and I asked her to let me know if that is working or not.   Meds verified and pill box refilled.    BP 136/70   Pulse 94   Resp 16   Wt 150 lb (68 kg)   SpO2 97%   BMI 32.46 kg/m  Weight yesterday-? Last visit weight-140  Patient Care Team: [name] as PCP - General (Physician Assistant) Tommas Pears, MD as PCP - Endocrinology (Endocrinology) Ladona Heinz, MD as PCP - Cardiology (Cardiology)  Patient Active Problem List   Diagnosis Date Noted   Skin infection 01/02/2024   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) (HCC) 12/28/2023   Hypertensive urgency 12/28/2023   Acute exacerbation of CHF (congestive heart failure) (HCC) 12/07/2023   Controlled type 2 diabetes mellitus with hyperglycemia (HCC) 08/31/2023   Nausea without  vomiting 08/31/2023   Epigastric pain 08/31/2023   Sleep apnea 08/30/2023   Abscess 08/30/2023   SOB (shortness of breath) 08/20/2023   Dizziness 08/20/2023   Chronic pancreatitis (HCC) 08/15/2023   Witnessed episode of apnea 06/27/2023   Pseudohyponatremia 04/10/2023   Metabolic acidosis 04/10/2023   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 coli UTI (urinary tract infection) 06/01/2022   DKA (diabetic ketoacidosis) (HCC) 06/01/2022   ASCUS of cervix with negative high risk HPV 05/31/2022   Acute on chronic pancreatitis (HCC) 05/29/2022   Hypokalemia 03/23/2022   COVID 03/15/2022   COVID-19 virus infection 03/14/2022   Hyponatremia 03/14/2022   Positive D dimer 03/14/2022   Stage 3b chronic kidney disease (CKD) (HCC) 03/14/2022   Pulmonary hypertension (HCC) 02/06/2022   NASH (nonalcoholic steatohepatitis) 02/06/2022   Obesity (BMI 30-39.9) 02/06/2022   Heart failure (HCC) 12/03/2021   Acute on chronic diastolic CHF (  congestive heart failure) (HCC) 12/02/2021   Elevated troponin 12/02/2021   Pancytopenia (HCC) 12/02/2021   Amenorrhea 12/02/2021   Hepatic steatosis 09/27/2021   Hyperosmolar hyperglycemic state (HHS) (HCC) 08/26/2021   Small intestinal bacterial overgrowth (SIBO) 08/26/2021   Lower extremity edema 08/26/2021   Hyperkalemia 08/14/2021   Hx of insulin  dependent diabetes mellitus 08/14/2021   CKD stage 3b, GFR 30-44 ml/min (HCC) 05/13/2021   Gastroparesis  due to DM (HCC) 05/12/2021   Abdominal distension 05/12/2021   Chest pain 03/10/2021   Acute on chronic combined systolic and diastolic CHF (congestive heart failure) (HCC) 02/09/2021   History of astrocytoma of brain 02/09/2021   Type 2 diabetes mellitus with hyperglycemia, with long-term current use of insulin  (HCC) 01/25/2021   Diarrhea    Elevated transaminase level    Orthostatic hypotension 11/28/2020   Acute combined systolic and diastolic heart failure (HCC)    Nonischemic cardiomyopathy (HCC)    Acute systolic congestive heart failure (HCC)    Elevated liver enzymes    Acute renal failure superimposed on stage 3b chronic kidney disease (HCC) 11/26/2020   Intractable abdominal pain 11/23/2020   Chronic systolic congestive heart failure (HCC) 11/23/2020   Essential hypertension 11/23/2020   Familial hypertriglyceridemia 11/23/2020   Prolonged QT interval 11/23/2020   Finger mass, left 08/12/2020   Nausea and vomiting 07/23/2020   Polycystic ovary syndrome 01/30/2020   Moderate aortic insufficiency 08/16/2019   Moderate mitral regurgitation 08/16/2019   Lipoprotein lipase deficiency, familial 06/19/2019   Microalbuminuria due to type 1 diabetes mellitus (HCC) 06/19/2019   Type 2 diabetes mellitus with hyperlipidemia (HCC) 10/10/2018   Hypothyroidism 10/10/2018    Current Outpatient Medications:    albuterol  (VENTOLIN  HFA) 108 (90 Base) MCG/ACT inhaler, Inhale 2 puffs into the lungs daily as needed for wheezing or shortness of breath., Disp: , Rfl:    amitriptyline  (ELAVIL ) 10 MG tablet, Take 10 mg by mouth. (Patient taking differently: Take 10 mg by mouth at bedtime.), Disp: , Rfl:    aspirin  EC 81 MG tablet, Take 1 tablet (81 mg total) by mouth daily. Swallow whole., Disp: 30 tablet, Rfl: 1   Atogepant 60 MG TABS, Take 60 mg by mouth. (Patient not taking: Reported on 02/07/2024), Disp: , Rfl:    atorvastatin  (LIPITOR ) 80 MG tablet, Take 1 tablet (80 mg total) by mouth every  evening., Disp: 30 tablet, Rfl: 0   azelastine  (ASTELIN ) 0.1 % nasal spray, Place 2 sprays into both nostrils daily as needed for rhinitis or allergies., Disp: , Rfl:    BAQSIMI ONE PACK 3 MG/DOSE POWD, Place 1 Dose into the nose once as needed (hypoglycemia)., Disp: , Rfl:    calcitRIOL  (ROCALTROL ) 0.25 MCG capsule, Take 0.25 mcg by mouth daily., Disp: , Rfl:    dicyclomine  (BENTYL ) 20 MG tablet, Take 1 tablet (20 mg total) by mouth 2 (two) times daily., Disp: 20 tablet, Rfl: 0   doxazosin  (CARDURA ) 2 MG tablet, Take 1 tablet (2 mg total) by mouth daily., Disp: 90 tablet, Rfl: 3   DULoxetine  (CYMBALTA ) 60 MG capsule, Take 60 mg by mouth at bedtime., Disp: , Rfl:    ergocalciferol  (VITAMIN D2) 1.25 MG (50000 UT) capsule, Take 1 capsule (50,000 Units total) by mouth every Sunday., Disp: 4 capsule, Rfl: 0   fenofibrate  54 MG tablet, Take 1 tablet (54 mg total) by mouth daily. (Patient taking differently: Take 54 mg by mouth at bedtime.), Disp: 30 tablet, Rfl: 0   Glucose Blood (BLOOD GLUCOSE TEST STRIPS)  STRP, 1 each by Does not apply route 3 (three) times daily. Use as directed to check blood sugar. May dispense any manufacturer covered by patient's insurance and fits patient's device., Disp: 100 strip, Rfl: 0   HUMALOG  KWIKPEN 100 UNIT/ML KwikPen, Inject 60 Units into the skin in the morning, at noon, and at bedtime., Disp: , Rfl:    HUMULIN  R U-500 KWIKPEN 500 UNIT/ML KwikPen, Inject 300 Units into the skin in the morning and at bedtime., Disp: , Rfl:    hydrALAZINE  (APRESOLINE ) 100 MG tablet, Take 1 tablet (100 mg total) by mouth every 8 (eight) hours., Disp: 90 tablet, Rfl: 0   Insulin  Pen Needle (PEN NEEDLES) 31G X 5 MM MISC, Use as directed 3 (three) times daily., Disp: 100 each, Rfl: 0   isosorbide  mononitrate (IMDUR ) 60 MG 24 hr tablet, Take 1 tablet (60 mg total) by mouth daily., Disp: 30 tablet, Rfl: 0   ivabradine  (CORLANOR ) 7.5 MG TABS tablet, Take 1 tablet (7.5 mg total) by mouth 2 (two)  times daily with a meal., Disp: 60 tablet, Rfl: 11   Lancet Device MISC, 1 each by Does not apply route 3 (three) times daily. May dispense any manufacturer covered by patient's insurance., Disp: 1 each, Rfl: 0   Lancets MISC, 1 each by Does not apply route 3 (three) times daily. Use as directed to check blood sugar. May dispense any manufacturer covered by patient's insurance and fits patient's device., Disp: 100 each, Rfl: 0   lansoprazole  (PREVACID ) 30 MG capsule, TAKE 1 CAPSULE (30 MG TOTAL) BY MOUTH 2 (TWO) TIMES DAILY BEFORE A MEAL., Disp: 60 capsule, Rfl: 2   levocetirizine (XYZAL ) 5 MG tablet, Take 5 mg by mouth at bedtime., Disp: , Rfl:    levothyroxine  (SYNTHROID ) 150 MCG tablet, Take 150 mcg by mouth daily before breakfast., Disp: , Rfl:    methylPREDNISolone  (MEDROL  DOSEPAK) 4 MG TBPK tablet, Take as directed, Disp: 21 each, Rfl: 0   pregabalin  (LYRICA ) 300 MG capsule, Take 300 mg by mouth at bedtime., Disp: , Rfl:    promethazine  (PHENERGAN ) 25 MG tablet, Take 25 mg by mouth at bedtime., Disp: , Rfl:    Safety Seal Miscellaneous MISC, Hormonic Hair Solution with minoxidil USP 7% and finasteride USP 0.05% - apply to affected areas daily every morning., Disp: 30 mL, Rfl: 2   SUMAtriptan  (IMITREX ) 100 MG tablet, Take 100 mg by mouth daily as needed., Disp: , Rfl:    torsemide  (DEMADEX ) 20 MG tablet, Take 3 tablets (60 mg total) by mouth daily., Disp: 90 tablet, Rfl: 2   tretinoin  (RETIN-A ) 0.05 % cream, Apply 1 Application topically at bedtime., Disp: , Rfl:  Allergies  Allergen Reactions   Icosapent  Ethyl (Epa Ethyl Ester) (Fish) Hives   Ketamine Other (See Comments)    In a vegetative state for 15 minutes, per pt   Maitake Itching and Other (See Comments)    Itchy throat   Morphine  Hives, Itching, Rash and Other (See Comments)    Sore throat, too   Mushroom Other (See Comments)    Pt has never had mushrooms but tested positive on allergy test   Penicillins Hives, Itching and  Rash    Tolerates cephalosporins   Shellfish Allergy Other (See Comments)    Pt has never had shellfish, but tested positive on allergy test. Pt states contrast in CT is okay.   Fentanyl  Nausea And Vomiting   Prednisone Rash      Social History   Socioeconomic  History   Marital status: Divorced    Spouse name: Not on file   Number of children: 0   Years of education: Not on file   Highest education level: Not on file  Occupational History   Occupation: Dance Movement Psychotherapist   Occupation: N/A  Tobacco Use   Smoking status: Never   Smokeless tobacco: Never  Vaping Use   Vaping status: Never Used  Substance and Sexual Activity   Alcohol  use: Never   Drug use: Never   Sexual activity: Yes  Other Topics Concern   Not on file  Social History Narrative   Not on file   Social Drivers of Health   Financial Resource Strain: Medium Risk (01/22/2024)   Received from [facility]   Overall Financial Resource Strain (CARDIA)    How hard is it for you to pay for the very basics like food, housing, medical care, and heating?: Somewhat hard  Food Insecurity: Food Insecurity Present (01/22/2024)   Received from [facility]   Hunger Vital Sign    Within the past 12 months, you worried that your food would run out before you got the money to buy more.: Sometimes true    Within the past 12 months, the food you bought just didn't last and you didn't have money to get more.: Sometimes true  Transportation Needs: Unmet Transportation Needs (01/22/2024)   Received from [facility] - Transportation    Lack of Transportation (Medical): Patient declined    In the past 12 months, has lack of transportation kept you from meetings, work, or from getting things needed for daily living?: Yes  Physical Activity: Inactive (01/22/2024)   Received from [facility]   Exercise Vital Sign    On average, how many days per week do you engage in moderate to strenuous exercise (like a brisk walk)?:  0 days    Minutes of Exercise per Session: Not on file  Stress: No Stress Concern Present (01/22/2024)   Received from [facility] - Occupational Stress Questionnaire    Do you feel stress - tense, restless, nervous, or anxious, or unable to sleep at night because your mind is troubled all the time - these days?: Not at all  Social Connections: Socially Integrated (01/22/2024)   Received from [facility]   Social Network    How would you rate your social network (family, work, friends)?: Good participation with social networks  Intimate Partner Violence: Not At Risk (01/22/2024)   Received from [facility]   HITS    Over the last 12 months how often did your partner physically hurt you?: Never    Over the last 12 months how often did your partner insult you or talk down to you?: Never    Over the last 12 months how often did your partner threaten you with physical harm?: Never    Over the last 12 months how often did your partner scream or curse at you?: Never    Physical Exam      Future Appointments  Date Time Provider Department Center  04/04/2024  2:30 PM [facility] PA/NP [facility] None  04/16/2024  8:00 PM Neda Jennet LABOR, MD [facility]  04/26/2024 11:00 AM Hope Almarie ORN, NP [facility] [address]  05/07/2024  3:15 PM Alm Jennifer SAILOR, DO [facility] None       Izetta Quivers, Paramedic [phone] [facility] Paramedic  02/19/24

## 2024-02-20 ENCOUNTER — Telehealth (HOSPITAL_COMMUNITY): Payer: Self-pay

## 2024-02-20 NOTE — Telephone Encounter (Signed)
 Pt reached out to let me know her weight still the same. No change from the extra torsemide  yesterday.  Per standing orders she is going to take another extra torsemide  today.    Izetta Quivers, EMT-Paramedic  [phone] 02/20/2024

## 2024-02-21 ENCOUNTER — Telehealth (HOSPITAL_COMMUNITY): Payer: Self-pay | Admitting: *Deleted

## 2024-02-21 DIAGNOSIS — I5032 Chronic diastolic (congestive) heart failure: Secondary | ICD-10-CM

## 2024-02-21 DIAGNOSIS — E790 Hyperuricemia without signs of inflammatory arthritis and tophaceous disease: Secondary | ICD-10-CM

## 2024-02-21 MED ORDER — ALLOPURINOL 100 MG PO TABS: 100.0000 mg | ORAL_TABLET | Freq: Every day | ORAL | 3 refills | Status: AC

## 2024-02-21 NOTE — Telephone Encounter (Signed)
 Called patient per Dr. [name] with following:  Uric acid elevated. Start allopurinol  100 daily  Pt verbalized understanding of same, says her Paramedic is aware and will address adding to her pillbox.

## 2024-02-22 ENCOUNTER — Encounter (HOSPITAL_BASED_OUTPATIENT_CLINIC_OR_DEPARTMENT_OTHER): Payer: Self-pay | Admitting: Emergency Medicine

## 2024-02-22 ENCOUNTER — Inpatient Hospital Stay (HOSPITAL_BASED_OUTPATIENT_CLINIC_OR_DEPARTMENT_OTHER)
Admission: EM | Admit: 2024-02-22 | Discharge: 2024-02-25 | DRG: 291 | Disposition: A | Discharge status: Home / Self Care | Attending: Internal Medicine | Admitting: Internal Medicine

## 2024-02-22 ENCOUNTER — Emergency Department (HOSPITAL_BASED_OUTPATIENT_CLINIC_OR_DEPARTMENT_OTHER)

## 2024-02-22 DIAGNOSIS — Z83438 Family history of other disorder of lipoprotein metabolism and other lipidemia: Secondary | ICD-10-CM

## 2024-02-22 DIAGNOSIS — E1169 Type 2 diabetes mellitus with other specified complication: Secondary | ICD-10-CM | POA: Diagnosis present

## 2024-02-22 DIAGNOSIS — I509 Heart failure, unspecified: Secondary | ICD-10-CM

## 2024-02-22 DIAGNOSIS — G4733 Obstructive sleep apnea (adult) (pediatric): Secondary | ICD-10-CM | POA: Diagnosis present

## 2024-02-22 DIAGNOSIS — N184 Chronic kidney disease, stage 4 (severe): Secondary | ICD-10-CM

## 2024-02-22 DIAGNOSIS — Z833 Family history of diabetes mellitus: Secondary | ICD-10-CM

## 2024-02-22 DIAGNOSIS — I2510 Atherosclerotic heart disease of native coronary artery without angina pectoris: Secondary | ICD-10-CM | POA: Diagnosis present

## 2024-02-22 DIAGNOSIS — E875 Hyperkalemia: Principal | ICD-10-CM | POA: Diagnosis present

## 2024-02-22 DIAGNOSIS — I272 Pulmonary hypertension, unspecified: Secondary | ICD-10-CM

## 2024-02-22 DIAGNOSIS — E1165 Type 2 diabetes mellitus with hyperglycemia: Secondary | ICD-10-CM | POA: Diagnosis present

## 2024-02-22 DIAGNOSIS — Z91013 Allergy to seafood: Secondary | ICD-10-CM

## 2024-02-22 DIAGNOSIS — K589 Irritable bowel syndrome without diarrhea: Secondary | ICD-10-CM | POA: Diagnosis present

## 2024-02-22 DIAGNOSIS — R9431 Abnormal electrocardiogram [ECG] [EKG]: Secondary | ICD-10-CM | POA: Diagnosis present

## 2024-02-22 DIAGNOSIS — K3184 Gastroparesis: Secondary | ICD-10-CM | POA: Diagnosis present

## 2024-02-22 DIAGNOSIS — Z79899 Other long term (current) drug therapy: Secondary | ICD-10-CM

## 2024-02-22 DIAGNOSIS — Z59868 Other specified financial insecurity: Secondary | ICD-10-CM

## 2024-02-22 DIAGNOSIS — I130 Hypertensive heart and chronic kidney disease with heart failure and stage 1 through stage 4 chronic kidney disease, or unspecified chronic kidney disease: Principal | ICD-10-CM | POA: Diagnosis present

## 2024-02-22 DIAGNOSIS — K7581 Nonalcoholic steatohepatitis (NASH): Secondary | ICD-10-CM | POA: Diagnosis present

## 2024-02-22 DIAGNOSIS — K861 Other chronic pancreatitis: Secondary | ICD-10-CM | POA: Diagnosis present

## 2024-02-22 DIAGNOSIS — Z7982 Long term (current) use of aspirin: Secondary | ICD-10-CM

## 2024-02-22 DIAGNOSIS — E1143 Type 2 diabetes mellitus with diabetic autonomic (poly)neuropathy: Secondary | ICD-10-CM | POA: Diagnosis present

## 2024-02-22 DIAGNOSIS — Z800 Family history of malignant neoplasm of digestive organs: Secondary | ICD-10-CM

## 2024-02-22 DIAGNOSIS — E781 Pure hyperglyceridemia: Secondary | ICD-10-CM | POA: Diagnosis present

## 2024-02-22 DIAGNOSIS — G8929 Other chronic pain: Secondary | ICD-10-CM | POA: Diagnosis present

## 2024-02-22 DIAGNOSIS — I428 Other cardiomyopathies: Secondary | ICD-10-CM | POA: Diagnosis present

## 2024-02-22 DIAGNOSIS — Z5982 Transportation insecurity: Secondary | ICD-10-CM

## 2024-02-22 DIAGNOSIS — G4730 Sleep apnea, unspecified: Secondary | ICD-10-CM | POA: Diagnosis present

## 2024-02-22 DIAGNOSIS — Z8249 Family history of ischemic heart disease and other diseases of the circulatory system: Secondary | ICD-10-CM

## 2024-02-22 DIAGNOSIS — N1832 Chronic kidney disease, stage 3b: Secondary | ICD-10-CM | POA: Diagnosis present

## 2024-02-22 DIAGNOSIS — R739 Hyperglycemia, unspecified: Secondary | ICD-10-CM | POA: Diagnosis present

## 2024-02-22 DIAGNOSIS — N179 Acute kidney failure, unspecified: Secondary | ICD-10-CM | POA: Diagnosis present

## 2024-02-22 DIAGNOSIS — I2489 Other forms of acute ischemic heart disease: Secondary | ICD-10-CM | POA: Diagnosis present

## 2024-02-22 DIAGNOSIS — Z880 Allergy status to penicillin: Secondary | ICD-10-CM

## 2024-02-22 DIAGNOSIS — E7800 Pure hypercholesterolemia, unspecified: Secondary | ICD-10-CM | POA: Diagnosis present

## 2024-02-22 DIAGNOSIS — D631 Anemia in chronic kidney disease: Secondary | ICD-10-CM | POA: Diagnosis present

## 2024-02-22 DIAGNOSIS — K761 Chronic passive congestion of liver: Secondary | ICD-10-CM | POA: Diagnosis present

## 2024-02-22 DIAGNOSIS — E871 Hypo-osmolality and hyponatremia: Secondary | ICD-10-CM | POA: Diagnosis present

## 2024-02-22 DIAGNOSIS — Z888 Allergy status to other drugs, medicaments and biological substances status: Secondary | ICD-10-CM

## 2024-02-22 DIAGNOSIS — Z91018 Allergy to other foods: Secondary | ICD-10-CM

## 2024-02-22 DIAGNOSIS — E282 Polycystic ovarian syndrome: Secondary | ICD-10-CM | POA: Diagnosis present

## 2024-02-22 DIAGNOSIS — Z85841 Personal history of malignant neoplasm of brain: Secondary | ICD-10-CM

## 2024-02-22 DIAGNOSIS — E1122 Type 2 diabetes mellitus with diabetic chronic kidney disease: Secondary | ICD-10-CM | POA: Diagnosis present

## 2024-02-22 DIAGNOSIS — I10 Essential (primary) hypertension: Secondary | ICD-10-CM | POA: Diagnosis present

## 2024-02-22 DIAGNOSIS — Z91199 Patient's noncompliance with other medical treatment and regimen due to unspecified reason: Secondary | ICD-10-CM

## 2024-02-22 DIAGNOSIS — Z5941 Food insecurity: Secondary | ICD-10-CM

## 2024-02-22 DIAGNOSIS — Z885 Allergy status to narcotic agent status: Secondary | ICD-10-CM

## 2024-02-22 DIAGNOSIS — I5043 Acute on chronic combined systolic (congestive) and diastolic (congestive) heart failure: Secondary | ICD-10-CM | POA: Diagnosis present

## 2024-02-22 DIAGNOSIS — Z8349 Family history of other endocrine, nutritional and metabolic diseases: Secondary | ICD-10-CM

## 2024-02-22 DIAGNOSIS — E8770 Fluid overload, unspecified: Secondary | ICD-10-CM

## 2024-02-22 DIAGNOSIS — I5033 Acute on chronic diastolic (congestive) heart failure: Secondary | ICD-10-CM | POA: Diagnosis present

## 2024-02-22 DIAGNOSIS — E7849 Other hyperlipidemia: Secondary | ICD-10-CM | POA: Diagnosis present

## 2024-02-22 DIAGNOSIS — Z79890 Hormone replacement therapy (postmenopausal): Secondary | ICD-10-CM

## 2024-02-22 DIAGNOSIS — Z1152 Encounter for screening for COVID-19: Secondary | ICD-10-CM

## 2024-02-22 DIAGNOSIS — R7989 Other specified abnormal findings of blood chemistry: Secondary | ICD-10-CM | POA: Diagnosis present

## 2024-02-22 DIAGNOSIS — Z8759 Personal history of other complications of pregnancy, childbirth and the puerperium: Secondary | ICD-10-CM

## 2024-02-22 DIAGNOSIS — E039 Hypothyroidism, unspecified: Secondary | ICD-10-CM | POA: Diagnosis present

## 2024-02-22 DIAGNOSIS — Z794 Long term (current) use of insulin: Secondary | ICD-10-CM

## 2024-02-22 LAB — BASIC METABOLIC PANEL WITH GFR
Anion gap: 15 (ref 5–15)
BUN: 55 mg/dL — ABNORMAL HIGH (ref 6–20)
CO2: 23 mmol/L (ref 22–32)
Calcium: 9.5 mg/dL (ref 8.9–10.3)
Chloride: 94 mmol/L — ABNORMAL LOW (ref 98–111)
Creatinine, Ser: 2.93 mg/dL — ABNORMAL HIGH (ref 0.44–1.00)
GFR, Estimated: 21 mL/min — ABNORMAL LOW (ref >60)
Glucose, Bld: 459 mg/dL — ABNORMAL HIGH (ref 70–99)
Potassium: 6.6 mmol/L (ref 3.5–5.1)
Sodium: 133 mmol/L — ABNORMAL LOW (ref 135–145)

## 2024-02-22 LAB — CBC WITH DIFFERENTIAL/PLATELET
Abs Immature Granulocytes: 0.09 K/uL — ABNORMAL HIGH (ref 0.00–0.07)
Basophils Absolute: 0 K/uL (ref 0.0–0.1)
Basophils Relative: 1 %
Eosinophils Absolute: 0.1 K/uL (ref 0.0–0.5)
Eosinophils Relative: 1 %
HCT: 29.1 % — ABNORMAL LOW (ref 36.0–46.0)
Hemoglobin: 9.2 g/dL — ABNORMAL LOW (ref 12.0–15.0)
Immature Granulocytes: 2 %
Lymphocytes Relative: 27 %
Lymphs Abs: 1.6 K/uL (ref 0.7–4.0)
MCH: 29.4 pg (ref 26.0–34.0)
MCHC: 31.6 g/dL (ref 30.0–36.0)
MCV: 93 fL (ref 80.0–100.0)
Monocytes Absolute: 0.5 K/uL (ref 0.1–1.0)
Monocytes Relative: 8 %
Neutro Abs: 3.8 K/uL (ref 1.7–7.7)
Neutrophils Relative %: 61 %
Platelets: 134 K/uL — ABNORMAL LOW (ref 150–400)
RBC: 3.13 MIL/uL — ABNORMAL LOW (ref 3.87–5.11)
RDW: 15.2 % (ref 11.5–15.5)
WBC: 6.1 K/uL (ref 4.0–10.5)
nRBC: 0 % (ref 0.0–0.2)

## 2024-02-22 LAB — HEPATIC FUNCTION PANEL
ALT: 203 U/L — ABNORMAL HIGH (ref 0–44)
AST: 121 U/L — ABNORMAL HIGH (ref 15–41)
Albumin: 4.6 g/dL (ref 3.5–5.0)
Alkaline Phosphatase: 108 U/L (ref 38–126)
Bilirubin, Direct: 0.4 mg/dL — ABNORMAL HIGH (ref 0.0–0.2)
Indirect Bilirubin: 0.2 mg/dL — ABNORMAL LOW (ref 0.3–0.9)
Total Bilirubin: 0.5 mg/dL (ref 0.0–1.2)
Total Protein: 7.7 g/dL (ref 6.5–8.1)

## 2024-02-22 LAB — PRO BRAIN NATRIURETIC PEPTIDE: Pro Brain Natriuretic Peptide: 685 pg/mL — ABNORMAL HIGH (ref <300.0)

## 2024-02-22 LAB — RESP PANEL BY RT-PCR (RSV, FLU A&B, COVID)  RVPGX2
Influenza A by PCR: NEGATIVE
Influenza B by PCR: NEGATIVE
Resp Syncytial Virus by PCR: NEGATIVE
SARS Coronavirus 2 by RT PCR: NEGATIVE

## 2024-02-22 LAB — TROPONIN T, HIGH SENSITIVITY
Troponin T High Sensitivity: 73 ng/L — ABNORMAL HIGH (ref 0–19)
Troponin T High Sensitivity: 75 ng/L — ABNORMAL HIGH (ref 0–19)

## 2024-02-22 LAB — HCG, SERUM, QUALITATIVE: Preg, Serum: NEGATIVE

## 2024-02-22 LAB — CBG MONITORING, ED: Glucose-Capillary: 438 mg/dL — ABNORMAL HIGH (ref 70–99)

## 2024-02-22 MED ORDER — INSULIN ASPART 100 UNIT/ML IJ SOLN
10.0000 [IU] | Freq: Once | INTRAMUSCULAR | Status: AC
  Administered 2024-02-22: 10 [IU] via INTRAVENOUS
  Filled 2024-02-22: qty 10

## 2024-02-22 MED ORDER — FUROSEMIDE 10 MG/ML IJ SOLN
40.0000 mg | Freq: Once | INTRAMUSCULAR | Status: AC
  Administered 2024-02-22: 40 mg via INTRAVENOUS
  Filled 2024-02-22: qty 4

## 2024-02-22 MED ORDER — LABETALOL HCL 5 MG/ML IV SOLN
10.0000 mg | Freq: Once | INTRAVENOUS | Status: AC
  Administered 2024-02-22: 10 mg via INTRAVENOUS
  Filled 2024-02-22: qty 4

## 2024-02-22 MED ORDER — SODIUM BICARBONATE 8.4 % IV SOLN
50.0000 meq | Freq: Once | INTRAVENOUS | Status: AC
  Administered 2024-02-22: 50 meq via INTRAVENOUS
  Filled 2024-02-22: qty 50

## 2024-02-22 MED ORDER — SODIUM ZIRCONIUM CYCLOSILICATE 10 G PO PACK
10.0000 g | PACK | Freq: Once | ORAL | Status: AC
  Administered 2024-02-22: 10 g via ORAL
  Filled 2024-02-22: qty 1

## 2024-02-22 MED ORDER — PREGABALIN 75 MG PO CAPS
300.0000 mg | ORAL_CAPSULE | Freq: Once | ORAL | Status: AC
  Administered 2024-02-22: 300 mg via ORAL
  Filled 2024-02-22: qty 4

## 2024-02-22 MED ORDER — DIPHENHYDRAMINE HCL 50 MG/ML IJ SOLN
12.5000 mg | Freq: Once | INTRAMUSCULAR | Status: AC
  Administered 2024-02-22: 12.5 mg via INTRAVENOUS
  Filled 2024-02-22: qty 1

## 2024-02-22 NOTE — ED Notes (Signed)
CBG [id number] 

## 2024-02-22 NOTE — ED Triage Notes (Signed)
 Sob, abdo swelling Started Sunday getting worse Denies chest pains

## 2024-02-22 NOTE — ED Provider Notes (Signed)
 [facility] Provider Note   CSN: [id number] Arrival date & time: 02/22/24  8090     Patient presents with: Shortness of Breath   Jennifer [name] is a 33 y.o. female.   Pt is a 33 yo female with pmhx significant for astrocytoma s/p surgery, PCOS, DM, hypothyroidism, HTN, CHF, afib, CKD, and gastroparesis.  Pt presents to the ED today with SOB and fluid overload.  Pt has gained about 15 lbs in the last month.  She has been taking extra torsemide  to help with the fluid overload, but does not feel like that's helped.  She did see her pcp on 11/25 and was encouraged to go to the ED then, but wanted to get through Thanksgiving.  She denies any cp.    Pt did have an ECHO on 11/10 which showed:  EF 50-55%, mild LVH, G1DD, RV normal, IVC mildly dilated suggesting she may  have some extra volume on board       Prior to Admission medications   Medication Sig Start Date End Date Taking? Authorizing Provider  albuterol  (VENTOLIN  HFA) 108 (90 Base) MCG/ACT inhaler Inhale 2 puffs into the lungs daily as needed for wheezing or shortness of breath. 10/03/23   [provider]  allopurinol  (ZYLOPRIM ) 100 MG tablet Take 1 tablet (100 mg total) by mouth daily. 02/21/24   [name], Toribio SAUNDERS, MD  amitriptyline  (ELAVIL ) 10 MG tablet Take 10 mg by mouth. Patient taking differently: Take 10 mg by mouth at bedtime. 02/01/24   [provider]  aspirin  EC 81 MG tablet Take 1 tablet (81 mg total) by mouth daily. Swallow whole. 12/18/23   [name], Toribio SAUNDERS, MD  Atogepant 60 MG TABS Take 60 mg by mouth. Patient not taking: Reported on 02/07/2024 02/01/24   [provider]  atorvastatin  (LIPITOR ) 80 MG tablet Take 1 tablet (80 mg total) by mouth every evening. 02/12/24   Glena Harlene HERO, FNP  azelastine  (ASTELIN ) 0.1 % nasal spray Place 2 sprays into both nostrils daily as needed for rhinitis or allergies.    [provider]   BAQSIMI ONE PACK 3 MG/DOSE POWD Place 1 Dose into the nose once as needed (hypoglycemia). 07/31/23   [provider]  calcitRIOL  (ROCALTROL ) 0.25 MCG capsule Take 0.25 mcg by mouth daily. 08/04/23   [provider]  dicyclomine  (BENTYL ) 20 MG tablet Take 1 tablet (20 mg total) by mouth 2 (two) times daily. 09/05/23   Dreama Longs, MD  doxazosin  (CARDURA ) 2 MG tablet Take 1 tablet (2 mg total) by mouth daily. 02/07/24   [name], Toribio SAUNDERS, MD  DULoxetine  (CYMBALTA ) 60 MG capsule Take 60 mg by mouth at bedtime. 09/26/22   [provider]  ergocalciferol  (VITAMIN D2) 1.25 MG (50000 UT) capsule Take 1 capsule (50,000 Units total) by mouth every Sunday. 11/12/23   Lee, Jordan, NP  fenofibrate  54 MG tablet Take 1 tablet (54 mg total) by mouth daily. Patient taking differently: Take 54 mg by mouth at bedtime. 01/03/24   Arrien, Elidia Toribio, MD  Glucose Blood (BLOOD GLUCOSE TEST STRIPS) STRP 1 each by Does not apply route 3 (three) times daily. Use as directed to check blood sugar. May dispense any manufacturer covered by patient's insurance and fits patient's device. 08/26/23   Shalhoub, Zachary PARAS, MD  HUMALOG  KWIKPEN 100 UNIT/ML KwikPen Inject 60 Units into the skin in the morning, at noon, and at bedtime.    [provider]  HUMULIN  R  U-500 KWIKPEN 500 UNIT/ML KwikPen Inject 300 Units into the skin in the morning and at bedtime.    [provider]  hydrALAZINE  (APRESOLINE ) 100 MG tablet Take 1 tablet (100 mg total) by mouth every 8 (eight) hours. 01/02/24   Arrien, Elidia Sieving, MD  Insulin  Pen Needle (PEN NEEDLES) 31G X 5 MM MISC Use as directed 3 (three) times daily. 08/26/23   Shalhoub, Zachary PARAS, MD  isosorbide  mononitrate (IMDUR ) 60 MG 24 hr tablet Take 1 tablet (60 mg total) by mouth daily. 01/03/24   Arrien, Mauricio Daniel, MD  ivabradine  (CORLANOR ) 7.5 MG TABS tablet Take 1 tablet (7.5 mg total) by mouth 2 (two) times daily with a meal. 01/08/24    [place], Harlene HERO, FNP  Lancet Device MISC 1 each by Does not apply route 3 (three) times daily. May dispense any manufacturer covered by patient's insurance. 08/26/23   Shalhoub, Zachary PARAS, MD  Lancets MISC 1 each by Does not apply route 3 (three) times daily. Use as directed to check blood sugar. May dispense any manufacturer covered by patient's insurance and fits patient's device. 08/26/23   Shalhoub, Zachary PARAS, MD  lansoprazole  (PREVACID ) 30 MG capsule TAKE 1 CAPSULE (30 MG TOTAL) BY MOUTH 2 (TWO) TIMES DAILY BEFORE A MEAL. 02/05/24   May, Deanna J, NP  levocetirizine (XYZAL ) 5 MG tablet Take 5 mg by mouth at bedtime. 03/05/22   [provider]  levothyroxine  (SYNTHROID ) 150 MCG tablet Take 150 mcg by mouth daily before breakfast.    [provider]  methylPREDNISolone  (MEDROL  DOSEPAK) 4 MG TBPK tablet Take as directed 02/08/24   Tobie Franky SQUIBB, DPM  pregabalin  (LYRICA ) 300 MG capsule Take 300 mg by mouth at bedtime.    [provider]  promethazine  (PHENERGAN ) 25 MG tablet Take 25 mg by mouth at bedtime.    [provider]  Safety Seal Miscellaneous MISC Hormonic Hair Solution with minoxidil USP 7% and finasteride USP 0.05% - apply to affected areas daily every morning. 06/06/23   Alm Delon SAILOR, DO  SUMAtriptan  (IMITREX ) 100 MG tablet Take 100 mg by mouth daily as needed. 10/11/23   [provider]  torsemide  (DEMADEX ) 20 MG tablet Take 3 tablets (60 mg total) by mouth daily. 12/16/23   Zenaida Morene PARAS, MD  tretinoin  (RETIN-A ) 0.05 % cream Apply 1 Application topically at bedtime.    [provider]    Allergies: Icosapent  ethyl (epa ethyl ester) (fish), Ketamine, [place], Morphine , Mushroom, Penicillins, Shellfish allergy, Fentanyl , and Prednisone    Review of Systems  Respiratory:  Positive for shortness of breath.   All other systems reviewed and are negative.   Updated Vital Signs BP (!) 169/95   Pulse (!) 117   Temp 98.1 F  (36.7 C)   Resp 18   SpO2 95%   Physical Exam Vitals and nursing note reviewed.  Constitutional:      Appearance: [name] well-developed. [name] obese.  HENT:     Head: Normocephalic and atraumatic.     Mouth/Throat:     Mouth: Mucous membranes are moist.     Pharynx: Oropharynx is clear.  Eyes:     Comments: Right eye ptosis  Cardiovascular:     Rate and Rhythm: Regular rhythm. Tachycardia present.  Pulmonary:     Breath sounds: Rhonchi present.  Abdominal:     General: Bowel sounds are normal.     Palpations: Abdomen is soft.  Musculoskeletal:     Cervical back: Normal  range of motion and neck supple.     Right lower leg: Edema present.     Left lower leg: Edema present.  Skin:    General: Skin is warm.     Capillary Refill: Capillary refill takes less than 2 seconds.  Neurological:     General: No focal deficit present.     Mental Status: [name] alert and oriented to person, place, and time.  Psychiatric:        Mood and Affect: Mood normal.        Behavior: Behavior normal.     (all labs ordered are listed, but only abnormal results are displayed) Labs Reviewed  BASIC METABOLIC PANEL WITH GFR - Abnormal; Notable for the following components:      Result Value   Sodium 133 (*)    Potassium 6.6 (*)    Chloride 94 (*)    Glucose, Bld 459 (*)    BUN 55 (*)    Creatinine, Ser 2.93 (*)    GFR, Estimated 21 (*)    All other components within normal limits  CBC WITH DIFFERENTIAL/PLATELET - Abnormal; Notable for the following components:   RBC 3.13 (*)    Hemoglobin 9.2 (*)    HCT 29.1 (*)    Platelets 134 (*)    Abs Immature Granulocytes 0.09 (*)    All other components within normal limits  PRO BRAIN NATRIURETIC PEPTIDE - Abnormal; Notable for the following components:   Pro Brain Natriuretic Peptide 685.0 (*)    All other components within normal limits  HEPATIC FUNCTION PANEL - Abnormal; Notable for the following components:   AST 121 (*)    ALT 203 (*)     Bilirubin, Direct 0.4 (*)    Indirect Bilirubin 0.2 (*)    All other components within normal limits  CBG MONITORING, ED - Abnormal; Notable for the following components:   Glucose-Capillary 438 (*)    All other components within normal limits  TROPONIN T, HIGH SENSITIVITY - Abnormal; Notable for the following components:   Troponin T High Sensitivity 73 (*)    All other components within normal limits  RESP PANEL BY RT-PCR (RSV, FLU A&B, COVID)  RVPGX2  HCG, SERUM, QUALITATIVE  I-STAT VENOUS BLOOD GAS, ED  TROPONIN T, HIGH SENSITIVITY    EKG: EKG Interpretation Date/Time:  Thursday February 22 2024 19:28:30 EST Ventricular Rate:  99 PR Interval:  123 QRS Duration:  92 QT Interval:  385 QTC Calculation: 495 R Axis:   26  Text Interpretation: Sinus rhythm Borderline prolonged QT interval No significant change since last tracing Confirmed by Dean Clarity [phone]) on 02/22/2024 8:20:20 PM  Radiology: [facility] Chest Port 1 View Result Date: 02/22/2024 EXAM: 1 VIEW(S) XRAY OF THE CHEST 02/22/2024 07:36:00 PM COMPARISON: 12/28/2023 CLINICAL HISTORY: sob FINDINGS: LUNGS AND PLEURA: Interstitial prominence in the lungs could reflect interstitial edema. No pleural effusion. No pneumothorax. HEART AND MEDIASTINUM: Cardiomegaly. BONES AND SOFT TISSUES: No acute osseous abnormality. IMPRESSION: 1. Interstitial prominence in the lungs, possibly reflecting interstitial edema. 2. Cardiomegaly. Electronically signed by: Franky Crease MD 02/22/2024 07:43 PM EST RP Workstation: [id number]     Procedures   Medications Ordered in the ED  labetalol  (NORMODYNE ) injection 10 mg (has no administration in time range)  furosemide  (LASIX ) injection 40 mg (40 mg Intravenous Given 02/22/24 2048)  insulin  aspart (novoLOG ) injection 10 Units (10 Units Intravenous Given 02/22/24 2215)  pregabalin  (LYRICA ) capsule 300 mg (300 mg Oral Given 02/22/24 2217)  sodium zirconium  cyclosilicate (LOKELMA ) packet 10 g (10  g Oral Given 02/22/24 2217)  sodium bicarbonate  injection 50 mEq (50 mEq Intravenous Given 02/22/24 2215)  diphenhydrAMINE  (BENADRYL ) injection 12.5 mg (12.5 mg Intravenous Given 02/22/24 2215)                                    Medical Decision Making Amount and/or Complexity of Data Reviewed Labs: ordered. Radiology: ordered.  Risk OTC drugs. Prescription drug management. Decision regarding hospitalization.   This patient presents to the ED for concern of sob, this involves an extensive number of treatment options, and is a complaint that carries with it a high risk of complications and morbidity.  The differential diagnosis includes chf, pna, kidney failure   Co morbidities that complicate the patient evaluation  astrocytoma s/p surgery, PCOS, DM, hypothyroidism, HTN, CHF, afib, CKD, and gastroparesis   Additional history obtained:  Additional history obtained from epic chart review    Lab Tests:  I Ordered, and personally interpreted labs.  The pertinent results include:  cbc with hgb low at 9.2 (stable); bmp with k elevated at 6.6, glucose elevated at 459, bun elevated at 55 and cr elevated at 2.93 (stable); LFTs with AST elevated at 121 and ALT elevated at 203 (stable); covid/flu/rsv neg; trop elevated at 73; bnp elevated at 685   Imaging Studies ordered:  I ordered imaging studies including CXR  I independently visualized and interpreted imaging which showed  . Interstitial prominence in the lungs, possibly reflecting interstitial edema.  2. Cardiomegaly.   I agree with the radiologist interpretation   Cardiac Monitoring:  The patient was maintained on a cardiac monitor.  I personally viewed and interpreted the cardiac monitored which showed an underlying rhythm of: st   Medicines ordered and prescription drug management:  I ordered medication including lasix , lokelma , bicarb, insulin   for hyperkalemia; labetalol  for htn  Reevaluation of the patient  after these medicines showed that the patient improved I have reviewed the patients home medicines and have made adjustments as needed   Test Considered:  ct   Critical Interventions:  Hyperkalemia tx   Consultations Obtained:  I requested consultation with the hospitalist (Dr. Franky),  and discussed lab and imaging findings as well as pertinent plan - he will admit   Problem List / ED Course:  CHF with volume overload:  iv lasix  given.  Pt said her nephrologist has talked to her about dialysis in the future as she's had several episodes of volume overload.  However, she does not have a fistula. CKD with hyperkalemia:  iv bicarb, iv insulin , iv lasix  and oral lokelma  given Hyperglycemia:  iv insulin  given.  Pt said she ate normal Thanksgiving dinner tonight.  She did take her usual evening insulin . Elevated trop:  no cp.  Likely strain.  Will continue to trend.   Reevaluation:  After the interventions noted above, I reevaluated the patient and found that they have :improved   Social Determinants of Health:  Lives at home   Dispostion:  After consideration of the diagnostic results and the patients response to treatment, I feel that the patent would benefit from admission.  CRITICAL CARE Performed by: Mliss Boyers   Total critical care time: 30 minutes  Critical care time was exclusive of separately billable procedures and treating other patients.  Critical care was necessary to treat or prevent imminent or life-threatening deterioration.  Critical care was time  spent personally by me on the following activities: development of treatment plan with patient and/or surrogate as well as nursing, discussions with consultants, evaluation of patient's response to treatment, examination of patient, obtaining history from patient or surrogate, ordering and performing treatments and interventions, ordering and review of laboratory studies, ordering and review of  radiographic studies, pulse oximetry and re-evaluation of patient's condition.        Final diagnoses:  Hyperkalemia  Hypervolemia, unspecified hypervolemia type  Elevated troponin  CKD (chronic kidney disease) stage 4, GFR 15-29 ml/min (HCC)  Hyperglycemia due to diabetes mellitus (HCC)  Hypertension, unspecified type    ED Discharge Orders     None          Dean Clarity, MD 02/22/24 2247

## 2024-02-22 NOTE — ED Notes (Signed)
 Provider aware of Pts CBG.

## 2024-02-23 ENCOUNTER — Other Ambulatory Visit: Payer: Self-pay

## 2024-02-23 DIAGNOSIS — E1169 Type 2 diabetes mellitus with other specified complication: Secondary | ICD-10-CM | POA: Diagnosis not present

## 2024-02-23 DIAGNOSIS — I5041 Acute combined systolic (congestive) and diastolic (congestive) heart failure: Secondary | ICD-10-CM | POA: Diagnosis not present

## 2024-02-23 DIAGNOSIS — I5033 Acute on chronic diastolic (congestive) heart failure: Secondary | ICD-10-CM

## 2024-02-23 DIAGNOSIS — I272 Pulmonary hypertension, unspecified: Secondary | ICD-10-CM | POA: Insufficient documentation

## 2024-02-23 DIAGNOSIS — E8770 Fluid overload, unspecified: Secondary | ICD-10-CM

## 2024-02-23 DIAGNOSIS — N184 Chronic kidney disease, stage 4 (severe): Secondary | ICD-10-CM | POA: Diagnosis not present

## 2024-02-23 DIAGNOSIS — I428 Other cardiomyopathies: Secondary | ICD-10-CM

## 2024-02-23 DIAGNOSIS — E875 Hyperkalemia: Secondary | ICD-10-CM

## 2024-02-23 DIAGNOSIS — N179 Acute kidney failure, unspecified: Secondary | ICD-10-CM | POA: Diagnosis not present

## 2024-02-23 DIAGNOSIS — E785 Hyperlipidemia, unspecified: Secondary | ICD-10-CM

## 2024-02-23 DIAGNOSIS — R7989 Other specified abnormal findings of blood chemistry: Secondary | ICD-10-CM | POA: Diagnosis not present

## 2024-02-23 DIAGNOSIS — E1165 Type 2 diabetes mellitus with hyperglycemia: Secondary | ICD-10-CM

## 2024-02-23 DIAGNOSIS — N1832 Chronic kidney disease, stage 3b: Secondary | ICD-10-CM

## 2024-02-23 LAB — BASIC METABOLIC PANEL WITH GFR
Anion gap: 15 (ref 5–15)
BUN: 56 mg/dL — ABNORMAL HIGH (ref 6–20)
CO2: 27 mmol/L (ref 22–32)
Calcium: 9 mg/dL (ref 8.9–10.3)
Chloride: 94 mmol/L — ABNORMAL LOW (ref 98–111)
Creatinine, Ser: 3.11 mg/dL — ABNORMAL HIGH (ref 0.44–1.00)
GFR, Estimated: 20 mL/min — ABNORMAL LOW (ref >60)
Glucose, Bld: 350 mg/dL — ABNORMAL HIGH (ref 70–99)
Potassium: 4.7 mmol/L (ref 3.5–5.1)
Sodium: 136 mmol/L (ref 135–145)

## 2024-02-23 LAB — CBC
HCT: 29.2 % — ABNORMAL LOW (ref 36.0–46.0)
Hemoglobin: 9.3 g/dL — ABNORMAL LOW (ref 12.0–15.0)
MCH: 29.3 pg (ref 26.0–34.0)
MCHC: 31.8 g/dL (ref 30.0–36.0)
MCV: 92.1 fL (ref 80.0–100.0)
Platelets: 133 K/uL — ABNORMAL LOW (ref 150–400)
RBC: 3.17 MIL/uL — ABNORMAL LOW (ref 3.87–5.11)
RDW: 15 % (ref 11.5–15.5)
WBC: 7.1 K/uL (ref 4.0–10.5)
nRBC: 0 % (ref 0.0–0.2)

## 2024-02-23 LAB — GLUCOSE, CAPILLARY
Glucose-Capillary: 116 mg/dL — ABNORMAL HIGH (ref 70–99)
Glucose-Capillary: 141 mg/dL — ABNORMAL HIGH (ref 70–99)
Glucose-Capillary: 375 mg/dL — ABNORMAL HIGH (ref 70–99)
Glucose-Capillary: 85 mg/dL (ref 70–99)

## 2024-02-23 MED ORDER — ISOSORBIDE MONONITRATE ER 60 MG PO TB24
60.0000 mg | ORAL_TABLET | Freq: Every day | ORAL | Status: DC
  Administered 2024-02-23 – 2024-02-25 (×3): 60 mg via ORAL
  Filled 2024-02-23 (×3): qty 1

## 2024-02-23 MED ORDER — FUROSEMIDE 10 MG/ML IJ SOLN
120.0000 mg | Freq: Two times a day (BID) | INTRAVENOUS | Status: DC
  Administered 2024-02-23 – 2024-02-24 (×2): 120 mg via INTRAVENOUS
  Filled 2024-02-23: qty 12
  Filled 2024-02-23 (×2): qty 10

## 2024-02-23 MED ORDER — INSULIN REGULAR HUMAN (CONC) 500 UNIT/ML ~~LOC~~ SOPN
200.0000 [IU] | PEN_INJECTOR | Freq: Two times a day (BID) | SUBCUTANEOUS | Status: DC
  Administered 2024-02-23 – 2024-02-25 (×5): 200 [IU] via SUBCUTANEOUS
  Filled 2024-02-23: qty 3

## 2024-02-23 MED ORDER — ATORVASTATIN CALCIUM 80 MG PO TABS
80.0000 mg | ORAL_TABLET | Freq: Every evening | ORAL | Status: DC
  Administered 2024-02-23 – 2024-02-24 (×2): 80 mg via ORAL
  Filled 2024-02-23 (×2): qty 1

## 2024-02-23 MED ORDER — SODIUM CHLORIDE 0.9 % IV SOLN: 250.0000 mL | INTRAVENOUS | Status: AC | PRN

## 2024-02-23 MED ORDER — FENOFIBRATE 54 MG PO TABS: 54.0000 mg | ORAL_TABLET | Freq: Every day | ORAL | Status: DC

## 2024-02-23 MED ORDER — LEVOTHYROXINE SODIUM 75 MCG PO TABS
150.0000 ug | ORAL_TABLET | Freq: Every day | ORAL | Status: DC
  Administered 2024-02-23 – 2024-02-25 (×3): 150 ug via ORAL
  Filled 2024-02-23 (×3): qty 2

## 2024-02-23 MED ORDER — INSULIN ASPART 100 UNIT/ML IJ SOLN
0.0000 [IU] | Freq: Every day | INTRAMUSCULAR | Status: DC
  Administered 2024-02-23: 5 [IU] via SUBCUTANEOUS
  Filled 2024-02-23: qty 5

## 2024-02-23 MED ORDER — DOXAZOSIN MESYLATE 2 MG PO TABS
2.0000 mg | ORAL_TABLET | Freq: Every day | ORAL | Status: DC
  Administered 2024-02-23 – 2024-02-25 (×3): 2 mg via ORAL
  Filled 2024-02-23 (×3): qty 1

## 2024-02-23 MED ORDER — INSULIN ASPART 100 UNIT/ML IJ SOLN
5.0000 [IU] | Freq: Three times a day (TID) | INTRAMUSCULAR | Status: DC
  Administered 2024-02-23 – 2024-02-25 (×5): 5 [IU] via SUBCUTANEOUS
  Filled 2024-02-23 (×4): qty 5
  Filled 2024-02-23: qty 1

## 2024-02-23 MED ORDER — PREGABALIN 100 MG PO CAPS
100.0000 mg | ORAL_CAPSULE | Freq: Once | ORAL | Status: AC
  Administered 2024-02-23: 100 mg via ORAL
  Filled 2024-02-23: qty 1

## 2024-02-23 MED ORDER — SODIUM CHLORIDE 0.9% FLUSH: 3.0000 mL | INTRAVENOUS | Status: DC | PRN

## 2024-02-23 MED ORDER — FUROSEMIDE 10 MG/ML IJ SOLN
40.0000 mg | Freq: Two times a day (BID) | INTRAMUSCULAR | Status: DC
  Administered 2024-02-23: 40 mg via INTRAVENOUS
  Filled 2024-02-23: qty 4

## 2024-02-23 MED ORDER — DULOXETINE HCL 60 MG PO CPEP
60.0000 mg | ORAL_CAPSULE | Freq: Every day | ORAL | Status: DC
  Administered 2024-02-23 – 2024-02-24 (×2): 60 mg via ORAL
  Filled 2024-02-23 (×2): qty 1

## 2024-02-23 MED ORDER — IVABRADINE HCL 5 MG PO TABS
7.5000 mg | ORAL_TABLET | Freq: Two times a day (BID) | ORAL | Status: DC
  Administered 2024-02-23 – 2024-02-25 (×5): 7.5 mg via ORAL
  Filled 2024-02-23 (×6): qty 1

## 2024-02-23 MED ORDER — ALLOPURINOL 100 MG PO TABS
100.0000 mg | ORAL_TABLET | Freq: Every day | ORAL | Status: DC
  Administered 2024-02-23 – 2024-02-25 (×3): 100 mg via ORAL
  Filled 2024-02-23 (×3): qty 1

## 2024-02-23 MED ORDER — PREGABALIN 100 MG PO CAPS
300.0000 mg | ORAL_CAPSULE | Freq: Every day | ORAL | Status: DC
  Administered 2024-02-23 – 2024-02-24 (×2): 300 mg via ORAL
  Filled 2024-02-23 (×2): qty 3

## 2024-02-23 MED ORDER — HEPARIN SODIUM (PORCINE) 5000 UNIT/ML IJ SOLN
5000.0000 [IU] | Freq: Three times a day (TID) | INTRAMUSCULAR | Status: DC
  Administered 2024-02-23 – 2024-02-25 (×6): 5000 [IU] via SUBCUTANEOUS
  Filled 2024-02-23 (×6): qty 1

## 2024-02-23 MED ORDER — INSULIN ASPART 100 UNIT/ML IJ SOLN
0.0000 [IU] | Freq: Three times a day (TID) | INTRAMUSCULAR | Status: DC
  Administered 2024-02-23: 15 [IU] via SUBCUTANEOUS
  Administered 2024-02-23: 2 [IU] via SUBCUTANEOUS
  Administered 2024-02-24: 5 [IU] via SUBCUTANEOUS
  Administered 2024-02-24: 2 [IU] via SUBCUTANEOUS
  Administered 2024-02-25: 5 [IU] via SUBCUTANEOUS
  Filled 2024-02-23: qty 5
  Filled 2024-02-23: qty 2
  Filled 2024-02-23: qty 5
  Filled 2024-02-23: qty 2
  Filled 2024-02-23: qty 15

## 2024-02-23 MED ORDER — SODIUM CHLORIDE 0.9% FLUSH
3.0000 mL | Freq: Two times a day (BID) | INTRAVENOUS | Status: DC
  Administered 2024-02-23 – 2024-02-25 (×6): 3 mL via INTRAVENOUS

## 2024-02-23 MED ORDER — HYDRALAZINE HCL 50 MG PO TABS
100.0000 mg | ORAL_TABLET | Freq: Three times a day (TID) | ORAL | Status: DC
  Administered 2024-02-23 – 2024-02-25 (×9): 100 mg via ORAL
  Filled 2024-02-23 (×9): qty 2

## 2024-02-23 MED ORDER — ACETAMINOPHEN 325 MG PO TABS: 650.0000 mg | ORAL_TABLET | ORAL | Status: DC | PRN

## 2024-02-23 MED ORDER — SODIUM ZIRCONIUM CYCLOSILICATE 10 G PO PACK: 10.0000 g | PACK | Freq: Every day | ORAL | Status: DC

## 2024-02-23 MED ORDER — ASPIRIN 81 MG PO TBEC
81.0000 mg | DELAYED_RELEASE_TABLET | Freq: Every day | ORAL | Status: DC
  Administered 2024-02-23 – 2024-02-25 (×3): 81 mg via ORAL
  Filled 2024-02-23 (×3): qty 1

## 2024-02-23 NOTE — Progress Notes (Signed)
 PROGRESS NOTE    Jennifer [name]  FMW:[id number] DOB: 31-Dec-1990 DOA: 02/22/2024 PCP: [name]   Brief Narrative: 33 year old with past medical history significant for astrocytoma s/p resection 1997 with history of chronic heart failure reduced ejection fraction, severe hypertriglyceridemia due to LPL deficiency, chronic pancreatitis presented with worsening dyspnea, 10 to 15 pounds weight gain recently.  She has been taking torsemide  without significant improvement. Evaluation in the ED labs consistent with hyperkalemia, mild elevated troponin transaminases hyperglycemia.    Assessment & Plan:   Principal Problem:   Acute CHF (congestive heart failure) (HCC) Active Problems:   Acute on chronic diastolic CHF (congestive heart failure) (HCC)   Hyperglycemia in setting of T2DM   Essential hypertension   Acute renal failure superimposed on stage 3b chronic kidney disease (HCC)   CKD stage 3b, GFR 30-44 ml/min (HCC)   Coronary artery disease involving native coronary artery of native heart without angina pectoris   History of astrocytoma of brain   Type 2 diabetes mellitus with hyperlipidemia (HCC)   Hypothyroidism   Sleep apnea   Hyperkalemia   NASH (nonalcoholic steatohepatitis)   Hyponatremia   Polycystic ovary syndrome   1-Acute on chronic diastolic heart failure - Presented with dyspnea, 15 pounds weight gain, proBNP 685.  Chest x-ray with interstitial edema - Continue IV Lasix  - Daily weight and strict I's and O's - Cardiology consulted - On ivabradine   Chronic kidney failure stage IIIb - Previous creatinine 1.6--- 3.4 - Continue to monitor renal function on diuretics   Insulin -dependent diabetes hyperglycemia uncontrolled - Continue 200 units Humulin  R - Continue scale insulin  - Will add meal coverage  Hypothyroidism;  Continue with Synthroid .   Elevated troponin -flat in setting CKD and HF>    Essential hypertension - Continue   hydralazine  and Lasix , Cardura   Hyponatremia - Monitor on diuretics   Chronic transaminases - Hepatic congestion in the setting of heart failure   History of astrocytoma of the brain in remission previously treated, as child.  - Follow-up as an outpatient  Obstructive sleep apnea - CPAP  Hyperkalemia: Received sodium bicarb and insulin  in the ED.   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       Estimated body mass index is 33.71 kg/m as calculated from the following:   Height as of this encounter: 4' 9 (1.448 m).   Weight as of this encounter: 70.7 kg.   DVT prophylaxis: Heparin  Code Status: Full code Family Communication: Care discussed with patient Disposition Plan:  Status is: Inpatient Remains inpatient appropriate because: Management of heart failure    Consultants:  Cardiology  Procedures:  None Antimicrobials:    Subjective: [name] alert, report gaining 15 pounds, worsening dyspnea last few days.   Objective: Vitals:   02/23/24 0000 02/23/24 0005 02/23/24 0121 02/23/24 0357  BP:  (!) 158/73 (!) 158/73 139/79  Pulse:  94 94 (!) 104  Resp:  19 19 19   Temp:  98.8 F (37.1 C) 98.8 F (37.1 C) 98.6 F (37 C)  TempSrc:  Oral Oral Oral  SpO2: 98% 97%  95%  Weight: 70.7 kg  70.7 kg 70.7 kg  Height:   4' 9.01 (1.448 m) 4' 9 (1.448 m)    Intake/Output Summary (Last 24 hours) at 02/23/2024 0731 Last data filed at 02/23/2024 0005 Gross per 24 hour  Intake 120 ml  Output --  Net 120 ml   Jennifer [name]  Weights   02/23/24 0000 02/23/24 0121 02/23/24 0357  Weight: 70.7 kg 70.7 kg 70.7 kg    Examination:  General exam: Appears calm and comfortable  Respiratory system: BL crackles.  Respiratory effort normal. Cardiovascular system: S1 & S2 heard, RRR. No JVD, murmurs, rubs, gallops or clicks. No pedal edema. Gastrointestinal system: Abdomen is  nondistended, soft and nontender. No organomegaly or masses felt. Normal bowel sounds heard. Central nervous system: Alert and oriented. No focal neurological deficits. Extremities: Symmetric 5 x 5 power.    Data Reviewed: I have personally reviewed following labs and imaging studies  CBC: Recent Labs  Lab 02/22/24 2050 02/23/24 0133  WBC 6.1 7.1  NEUTROABS 3.8  --   HGB 9.2* 9.3*  HCT 29.1* 29.2*  MCV 93.0 92.1  PLT 134* 133*   Basic Metabolic Panel: Recent Labs  Lab 02/22/24 2050 02/23/24 0135  NA 133* 136  K 6.6* 4.7  CL 94* 94*  CO2 23 27  GLUCOSE 459* 350*  BUN 55* 56*  CREATININE 2.93* 3.11*  CALCIUM  9.5 9.0   GFR: Estimated Creatinine Clearance: 21.1 mL/min (A) (by C-G formula based on SCr of 3.11 mg/dL (H)). Liver Function Tests: Recent Labs  Lab 02/22/24 2050  AST 121*  ALT 203*  ALKPHOS 108  BILITOT 0.5  PROT 7.7  ALBUMIN  4.6   No results for input(s): LIPASE, AMYLASE in the last 168 hours. No results for input(s): AMMONIA in the last 168 hours. Coagulation Profile: No results for input(s): INR, PROTIME in the last 168 hours. Cardiac Enzymes: No results for input(s): CKTOTAL, CKMB, CKMBINDEX, TROPONINI in the last 168 hours. BNP (last 3 results) Recent Labs    12/07/23 0018 02/22/24 2050  PROBNP 433.0* 685.0*   HbA1C: No results for input(s): HGBA1C in the last 72 hours. CBG: Recent Labs  Lab 02/22/24 2102 02/23/24 0009  GLUCAP 438* 375*   Lipid Profile: No results for input(s): CHOL, HDL, LDLCALC, TRIG, CHOLHDL, LDLDIRECT in the last 72 hours. Thyroid  Function Tests: No results for input(s): TSH, T4TOTAL, FREET4, T3FREE, THYROIDAB in the last 72 hours. Anemia Panel: No results for input(s): VITAMINB12, FOLATE, FERRITIN, TIBC, IRON, RETICCTPCT in the last 72 hours. Sepsis Labs: No results for input(s): PROCALCITON, LATICACIDVEN in the last 168 hours.  Recent Results (from  the past 240 hours)  Resp panel by RT-PCR (RSV, Flu A&B, Covid) Anterior Nasal Swab     Status: None   Collection Time: 02/22/24  9:00 PM   Specimen: Anterior Nasal Swab  Result Value Ref Range Status   SARS Coronavirus 2 by RT PCR NEGATIVE NEGATIVE Final    Comment: (NOTE) SARS-CoV-2 target nucleic acids are NOT DETECTED.  The SARS-CoV-2 RNA is generally detectable in upper respiratory specimens during the acute phase of infection. The lowest concentration of SARS-CoV-2 viral copies this assay can detect is 138 copies/mL. A negative result does not preclude SARS-Cov-2 infection and should not be used as the sole basis for treatment or other patient management decisions. A negative result may occur with  improper specimen collection/handling, submission of specimen other than nasopharyngeal swab, presence of viral mutation(s) within the areas targeted by this assay, and inadequate number of viral copies(<138 copies/mL). A negative result must be combined with clinical observations, patient history, and epidemiological information. The expected result is Negative.  Fact Sheet for Patients:  bloggercourse.com  Fact Sheet for Healthcare Providers:  seriousbroker.it  This test is no t yet approved or cleared by the [place]  FDA and  has been authorized for detection and/or diagnosis of SARS-CoV-2 by FDA under an Emergency Use Authorization (EUA). This EUA will remain  in effect (meaning this test can be used) for the duration of the COVID-19 declaration under Section 564(b)(1) of the Act, 21 U.S.C.section 360bbb-3(b)(1), unless the authorization is terminated  or revoked sooner.       Influenza A by PCR NEGATIVE NEGATIVE Final   Influenza B by PCR NEGATIVE NEGATIVE Final    Comment: (NOTE) The Xpert Xpress SARS-CoV-2/FLU/RSV plus assay is intended as an aid in the diagnosis of influenza from Nasopharyngeal swab specimens  and should not be used as a sole basis for treatment. Nasal washings and aspirates are unacceptable for Xpert Xpress SARS-CoV-2/FLU/RSV testing.  Fact Sheet for Patients: bloggercourse.com  Fact Sheet for Healthcare Providers: seriousbroker.it  This test is not yet approved or cleared by the [place]  FDA and has been authorized for detection and/or diagnosis of SARS-CoV-2 by FDA under an Emergency Use Authorization (EUA). This EUA will remain in effect (meaning this test can be used) for the duration of the COVID-19 declaration under Section 564(b)(1) of the Act, 21 U.S.C. section 360bbb-3(b)(1), unless the authorization is terminated or revoked.     Resp Syncytial Virus by PCR NEGATIVE NEGATIVE Final    Comment: (NOTE) Fact Sheet for Patients: bloggercourse.com  Fact Sheet for Healthcare Providers: seriousbroker.it  This test is not yet approved or cleared by the [place]  FDA and has been authorized for detection and/or diagnosis of SARS-CoV-2 by FDA under an Emergency Use Authorization (EUA). This EUA will remain in effect (meaning this test can be used) for the duration of the COVID-19 declaration under Section 564(b)(1) of the Act, 21 U.S.C. section 360bbb-3(b)(1), unless the authorization is terminated or revoked.  Performed at [facility], [address] [place], [place] [address]          Radiology Studies: DG Chest [facility] 1 View Result Date: 02/22/2024 EXAM: 1 VIEW(S) XRAY OF THE CHEST 02/22/2024 07:36:00 PM COMPARISON: 12/28/2023 CLINICAL HISTORY: sob FINDINGS: LUNGS AND PLEURA: Interstitial prominence in the lungs could reflect interstitial edema. No pleural effusion. No pneumothorax. HEART AND MEDIASTINUM: Cardiomegaly. BONES AND SOFT TISSUES: No acute osseous abnormality. IMPRESSION: 1. Interstitial prominence in the lungs,  possibly reflecting interstitial edema. 2. Cardiomegaly. Electronically signed by: Franky Crease MD 02/22/2024 07:43 PM EST RP Workstation: [id number]        Scheduled Meds:  aspirin  EC  81 mg Oral Daily   atorvastatin   80 mg Oral QPM   doxazosin   2 mg Oral Daily   furosemide   40 mg Intravenous BID   heparin   5,000 Units Subcutaneous Q8H   hydrALAZINE   100 mg Oral Q8H   insulin  aspart  0-15 Units Subcutaneous TID WC   insulin  aspart  0-5 Units Subcutaneous QHS   insulin  regular human CONCENTRATED  200 Units Subcutaneous BID WC   isosorbide  mononitrate  60 mg Oral Daily   ivabradine   7.5 mg Oral BID WC   levothyroxine   150 mcg Oral QAC breakfast   pregabalin   300 mg Oral QHS   sodium chloride  flush  3 mL Intravenous Q12H   sodium zirconium cyclosilicate   10 g Oral Daily   Continuous Infusions:  sodium chloride        LOS: 1 day    Time spent: 35 Minutes    Jennifer Corbridge A Semaj Kham, MD [facility]   If 7PM-7AM, please contact night-coverage [email or web address]  02/23/2024, 7:31 AM

## 2024-02-23 NOTE — H&P (Signed)
 History and Physical    Patient: Jennifer [name] FMW:[id number] DOB: April 03, 1990 DOA: 02/22/2024 DOS: the patient was seen and examined on 02/23/2024 PCP: Emilio Joesph DEL, PA-C  Patient coming from: Home  Chief Complaint:  Chief Complaint  Patient presents with   Shortness of Breath   HPI: Jennifer [name] is a 33 y.o. female with medical history significant of astrocytoma s/p resection in 1997 with history of chronic HFrEF severe hypertriglyceridemia due to LPL deficiency chronic pancreatitis presents with worsening shortness of breath features consistent with acute CHF with patient have gained about 10 to 15 pounds recently.  Patient has noted progressive shortness of breath and cough.  [name] on torsemide  at home.  She tried to take extra doses of the torso right but no help.  She was seen by her PCP 2 days ago where she was encouraged to come to the ED but did not.  Patient was tried to get through Thanksgiving however today it got worse so she decided to come to the ER at [facility] where she was seen and evaluated.  Clinically she looks fluid overloaded.  Labs also show hyperkalemia for which temporizing measures were given patient also placed on Lasix  with admitted for acute CHF with worsening potassium.  Troponin mildly elevated.  Patient also has elevated LFTs which is chronic.  Also anemia of chronic disease and significant hyperglycemia was glucose of 459.  She has been admitted for further evaluation and treatment  Review of Systems: As mentioned in the history of present illness. All other systems reviewed and are negative. Past Medical History:  Diagnosis Date   Afib (HCC) 05/12/2021   Brain tumor (HCC) 03/29/1995   astrocytoma   CHF (congestive heart failure) (HCC)    Cholesterosis    CKD (chronic kidney disease) stage 4, GFR 15-29 ml/min (HCC) 05/13/2021   DM (diabetes mellitus) (HCC) 10/10/2018   Fatty liver    Gastroparesis due to DM (HCC)  05/12/2021   HTN (hypertension) 10/10/2018   Hypothyroidism 10/10/2018   Lipoprotein deficiency    Lung disease    longevity long term   Pancreatitis    Polycystic ovary syndrome    Past Surgical History:  Procedure Laterality Date   ABDOMINAL SURGERY     pt states during miscarriage got her intestine   BRAIN SURGERY     ESOPHAGOGASTRODUODENOSCOPY N/A 09/01/2023   Procedure: EGD (ESOPHAGOGASTRODUODENOSCOPY);  Surgeon: Abran Norleen SAILOR, MD;  Location: [facility];  Service: Gastroenterology;  Laterality: N/A;   EYE MUSCLE SURGERY Right 03/28/2014   PRESSURE SENSOR/CARDIOMEMS N/A 12/06/2021   Procedure: PRESSURE SENSOR/CARDIOMEMS;  Surgeon: [name] Toribio SAUNDERS, MD;  Location: [facility];  Service: Cardiovascular;  Laterality: N/A;   RIGHT HEART CATH N/A 08/05/2021   Procedure: RIGHT HEART CATH;  Surgeon: [name] Toribio SAUNDERS, MD;  Location: [facility];  Service: Cardiovascular;  Laterality: N/A;   RIGHT HEART CATH N/A 12/06/2021   Procedure: RIGHT HEART CATH;  Surgeon: [name] Toribio SAUNDERS, MD;  Location: [facility];  Service: Cardiovascular;  Laterality: N/A;   RIGHT HEART CATH N/A 12/13/2023   Procedure: RIGHT HEART CATH;  Surgeon: Zenaida Morene PARAS, MD;  Location: [facility] CV [facility];  Service: Cardiovascular;  Laterality: N/A;   RIGHT/LEFT HEART CATH AND CORONARY ANGIOGRAPHY N/A 12/03/2020   Procedure: RIGHT/LEFT HEART CATH AND CORONARY ANGIOGRAPHY;  Surgeon: Ladona Heinz, MD;  Location: [facility];  Service: Cardiovascular;  Laterality: N/A;   VENTRICULOSTOMY  03/28/1997  Social History:  reports that she has never smoked. She has never used smokeless tobacco. She reports that she does not drink alcohol  and does not use drugs.  Allergies  Allergen Reactions   Icosapent  Ethyl (Epa Ethyl Ester) (Fish) Hives   Ketamine Other (See Comments)    In a vegetative state for 15 minutes, per pt   Maitake Itching and Other (See Comments)    Itchy throat   Morphine   Hives, Itching, Rash and Other (See Comments)    Sore throat, too   Mushroom Other (See Comments)    Pt has never had mushrooms but tested positive on allergy test   Penicillins Hives, Itching and Rash    Tolerates cephalosporins   Shellfish Allergy Other (See Comments)    Pt has never had shellfish, but tested positive on allergy test. Pt states contrast in CT is okay.   Fentanyl  Nausea And Vomiting   Prednisone Rash    Family History  Problem Relation Age of Onset   Diabetes Mother    Hypertension Mother    Hyperlipidemia Mother    Thyroid  disease Mother    Hypertension Father    Diabetes Father    Pancreatic cancer Paternal Aunt    Pancreatic cancer Paternal Uncle    Colon cancer Neg Hx    Esophageal cancer Neg Hx    Stomach cancer Neg Hx     Prior to Admission medications   Medication Sig Start Date End Date Taking? Authorizing Provider  albuterol  (VENTOLIN  HFA) 108 (90 Base) MCG/ACT inhaler Inhale 2 puffs into the lungs daily as needed for wheezing or shortness of breath. 10/03/23   [provider]  allopurinol  (ZYLOPRIM ) 100 MG tablet Take 1 tablet (100 mg total) by mouth daily. 02/21/24   [name], Toribio SAUNDERS, MD  amitriptyline  (ELAVIL ) 10 MG tablet Take 10 mg by mouth. Patient taking differently: Take 10 mg by mouth at bedtime. 02/01/24   [provider]  aspirin  EC 81 MG tablet Take 1 tablet (81 mg total) by mouth daily. Swallow whole. 12/18/23   [name], Toribio SAUNDERS, MD  Atogepant 60 MG TABS Take 60 mg by mouth. Patient not taking: Reported on 02/07/2024 02/01/24   [provider]  atorvastatin  (LIPITOR ) 80 MG tablet Take 1 tablet (80 mg total) by mouth every evening. 02/12/24   Milford, Harlene HERO, FNP  azelastine  (ASTELIN ) 0.1 % nasal spray Place 2 sprays into both nostrils daily as needed for rhinitis or allergies.    [provider]  BAQSIMI ONE PACK 3 MG/DOSE POWD Place 1 Dose into the nose once as needed (hypoglycemia). 07/31/23    [provider]  calcitRIOL  (ROCALTROL ) 0.25 MCG capsule Take 0.25 mcg by mouth daily. 08/04/23   [provider]  dicyclomine  (BENTYL ) 20 MG tablet Take 1 tablet (20 mg total) by mouth 2 (two) times daily. 09/05/23   Dreama Longs, MD  doxazosin  (CARDURA ) 2 MG tablet Take 1 tablet (2 mg total) by mouth daily. 02/07/24   [name], Toribio SAUNDERS, MD  DULoxetine  (CYMBALTA ) 60 MG capsule Take 60 mg by mouth at bedtime. 09/26/22   [provider]  ergocalciferol  (VITAMIN D2) 1.25 MG (50000 UT) capsule Take 1 capsule (50,000 Units total) by mouth every Sunday. 11/12/23   Lee, Jordan, NP  fenofibrate  54 MG tablet Take 1 tablet (54 mg total) by mouth daily. Patient taking differently: Take 54 mg by mouth at bedtime. 01/03/24   Arrien, Elidia Toribio, MD  Glucose Blood (BLOOD GLUCOSE TEST STRIPS) STRP  1 each by Does not apply route 3 (three) times daily. Use as directed to check blood sugar. May dispense any manufacturer covered by patient's insurance and fits patient's device. 08/26/23   Shalhoub, Zachary PARAS, MD  HUMALOG  KWIKPEN 100 UNIT/ML KwikPen Inject 60 Units into the skin in the morning, at noon, and at bedtime.    [provider]  HUMULIN  R U-500 KWIKPEN 500 UNIT/ML KwikPen Inject 300 Units into the skin in the morning and at bedtime.    [provider]  hydrALAZINE  (APRESOLINE ) 100 MG tablet Take 1 tablet (100 mg total) by mouth every 8 (eight) hours. 01/02/24   Arrien, Elidia Sieving, MD  Insulin  Pen Needle (PEN NEEDLES) 31G X 5 MM MISC Use as directed 3 (three) times daily. 08/26/23   Shalhoub, Zachary PARAS, MD  isosorbide  mononitrate (IMDUR ) 60 MG 24 hr tablet Take 1 tablet (60 mg total) by mouth daily. 01/03/24   Arrien, Mauricio Daniel, MD  ivabradine  (CORLANOR ) 7.5 MG TABS tablet Take 1 tablet (7.5 mg total) by mouth 2 (two) times daily with a meal. 01/08/24   Milford, Harlene HERO, FNP  Lancet Device MISC 1 each by Does not apply route 3 (three) times daily. May  dispense any manufacturer covered by patient's insurance. 08/26/23   Shalhoub, Zachary PARAS, MD  Lancets MISC 1 each by Does not apply route 3 (three) times daily. Use as directed to check blood sugar. May dispense any manufacturer covered by patient's insurance and fits patient's device. 08/26/23   Shalhoub, Zachary PARAS, MD  lansoprazole  (PREVACID ) 30 MG capsule TAKE 1 CAPSULE (30 MG TOTAL) BY MOUTH 2 (TWO) TIMES DAILY BEFORE A MEAL. 02/05/24   May, Deanna J, NP  levocetirizine (XYZAL ) 5 MG tablet Take 5 mg by mouth at bedtime. 03/05/22   [provider]  levothyroxine  (SYNTHROID ) 150 MCG tablet Take 150 mcg by mouth daily before breakfast.    [provider]  methylPREDNISolone  (MEDROL  DOSEPAK) 4 MG TBPK tablet Take as directed 02/08/24   Tobie Franky SQUIBB, DPM  pregabalin  (LYRICA ) 300 MG capsule Take 300 mg by mouth at bedtime.    [provider]  promethazine  (PHENERGAN ) 25 MG tablet Take 25 mg by mouth at bedtime.    [provider]  Safety Seal Miscellaneous MISC Hormonic Hair Solution with minoxidil USP 7% and finasteride USP 0.05% - apply to affected areas daily every morning. 06/06/23   Alm Delon SAILOR, DO  SUMAtriptan  (IMITREX ) 100 MG tablet Take 100 mg by mouth daily as needed. 10/11/23   [provider]  torsemide  (DEMADEX ) 20 MG tablet Take 3 tablets (60 mg total) by mouth daily. 12/16/23   Zenaida Morene PARAS, MD  tretinoin  (RETIN-A ) 0.05 % cream Apply 1 Application topically at bedtime.    [provider]    Physical Exam: Vitals:   02/22/24 2305 02/22/24 2315 02/22/24 2317 02/22/24 2322  BP: (!) 170/92 139/76    Pulse:  (!) 106 89   Resp: (!) 26 (!) 26 (!) 24   Temp:    98 F (36.7 C)  TempSrc:    Oral  SpO2:  95% 96%    Constitutional: Chronically ill looking, NAD, calm, comfortable Eyes: PERRL, lids and conjunctivae normal ENMT: Mucous membranes are moist. Posterior pharynx clear of any exudate or lesions.Normal dentition.  Neck:  normal, supple, no masses, no thyromegaly Respiratory: Coarse breath sound with diffuse crackles no accessory muscle use.  Cardiovascular: Sinus tachycardia, no murmurs / rubs / gallops.  1+ extremity  edema. 2+ pedal pulses. No carotid bruits.  Abdomen: no tenderness, no masses palpated. No hepatosplenomegaly. Bowel sounds positive.  Musculoskeletal: Good range of motion, no joint swelling or tenderness, Skin: no rashes, lesions, ulcers. No induration Neurologic: CN 2-12 grossly intact. Sensation intact, DTR normal. Strength 5/5 in all 4.  Psychiatric: Normal judgment and insight. Alert and oriented x 3. Normal mood  Data Reviewed:  Temperature 98.1, blood pressure 189/75, pulse 117, respiratory 26 and oxygen sat 96% on room air.  Sodium 133 potassium 6.6 chloride 94.  Glucose is 459 BUN 55 creatinine 2.93.  AST 121 ALT 203.  Total bilirubin 0.5.  proBNP 685.  Initial troponin 73 and then 75.  Hemoglobin 9.2.  Acute viral screen is negative for flu COVID-19 and RSV.  Chest x-ray showed interstitial prominence in the lungs reflecting interstitial edema as well as cardiomegaly.  EKG shows sinus rhythm.  Also borderline prolonged QT interval.  Assessment and Plan:  #1 acute on chronic diastolic heart failure: Recent echocardiogram showed EF of 55 to 60%.  Patient appears to have diastolic dysfunction.  She has gained about 12 to 15 pounds lately.  Will admit the patient and switch to IV Lasix .  Aggressively diuresis but monitor renal function closely.  We would likely get cardiology consultation in the morning to assist with care.  #2 acute on chronic kidney failure: Patient has chronic kidney disease stage IIIb.  Creatinine is roughly around 3.  Seems to be close to baseline but slightly worsened.  #3 insulin -dependent diabetes: Initiate sliding scale insulin .  Continue home long-acting insulin .  #4 elevated troponins: Most likely due to CHF exacerbation.  Continue to monitor  #5 essential  hypertension: Blood pressure is elevated.  Resume home regimen and adjust.  5 hyponatremia: Most likely pseudohyponatremia from hyperglycemia.  Glucose of 459.  Correct glucose and monitor sodium level.  #6 elevated LFTs: Most likely hepatic congestion.  This appears to be chronic.  Continue to monitor  #7 history of astrocytoma of the brain: In remission.  Previously treated.  #8 obstructive sleep apnea: Will offer CPAP.  #9 hyperkalemia: Patient was given sodium bicarb as well as insulin  and glucose in the ER.  Will add Lokelma .    Advance Care Planning:   Code Status: Prior full code  Consults: Consider cardiology consult in the morning  Family Communication: No family at bedside  Severity of Illness: The appropriate patient status for this patient is INPATIENT. Inpatient status is judged to be reasonable and necessary in order to provide the required intensity of service to ensure the patient's safety. The patient's presenting symptoms, physical exam findings, and initial radiographic and laboratory data in the context of their chronic comorbidities is felt to place them at high risk for further clinical deterioration. Furthermore, it is not anticipated that the patient will be medically stable for discharge from the hospital within 2 midnights of admission.   * I certify that at the point of admission it is my clinical judgment that the patient will require inpatient hospital care spanning beyond 2 midnights from the point of admission due to high intensity of service, high risk for further deterioration and high frequency of surveillance required.*  AuthorBETHA SIM KNOLL, MD 02/23/2024 12:02 AM  For on call review [email or web address].

## 2024-02-23 NOTE — Consult Note (Signed)
 Cardiology Consultation   Patient ID: Jennifer [name] MRN: [id number]; DOB: 01/17/1991  Admit date: 02/22/2024 Date of Consult: 02/23/2024  PCP:  Emilio Joesph DEL, PA-C    [facility]Care Providers Cardiologist:  [name], MD        Patient Profile: Jennifer [name] is a 33 y.o. female with a history of CAD with occluded RCA noted on cardiac catheterization in 11/2020 (treated medically), chronic HFrEF with EF as low as 25-30% in 2022 with subsequent normalization to 50-55% in 01/2024, pulmonary hypertension, systemic hypertension, hyperlipidemia with severe hypertriglyceridemia due to LDL deficiency, insulin -dependent diabetes mellitus with gastroparesis, chronic pancreatitis, non-alcoholic fatty liver disease, CKD stage IV, hypothyroidism, obstructive sleep apnea unable to tolerate CPAP,  astrocytoma s/p resection in 1997 who is being seen 02/23/2024 for the evaluation of CHF at the request of Dr. Madelyne.  History of Present Illness: Ms. Deeley is a 33 year old female with a complex history as detailed above who is followed by Dr. Bergamo and Dr. [name].  She was admitted in 10/2020 for vasculitis.  Workup during that admission showed elevated triglycerides of >4,000 and severely reduced EF.  Echo showed LVEF of 20-25% with moderate RV dysfunction and mild septal flattening.  Cardiac MRI showed a severely reduced LVEF of 29%, normal RVEF, moderate pericardial effusion, and findings consistent with prior myocarditis versus sarcoidosis. R/ LHC showed a very small but dominant RCA with diffuse disease and 99% stenosis of mid segment but no other significant disease as well as preserved cardiac output. Her single vessel CAD was not felt to explain her markedly reduced LVEF. No PCI was performed and continued medical therapy was recommended. EF has subsequently improved. She has sought second opinions at both [facility] and [place].   She has had multiple hospitalization over the  years for a variety of issues including DKA/ HHS, chronic pancreatitis with intractable abdominal pain, acute on chronic CHF, and severe hyperkalemia. She had cardiomems placed in 11/2021.   She was admitted in 11/2023 and 12/2023 for acute on chronic CHF. Echo in 11/2023 showed LVEf of 45-50% with mild LVH and grade 2 diastolic dysfunction, low normal RV function, and mild MR. RHC in 11/2023 showed reduced cardiac output by assumed Fick (though PA sat was similar to prior cath in 2023), severely elevated of SVR, and normal left/ right filling pressures. Cardiomems correlated well with RHC numbers.   Repeat Echo on 02/05/2024 showed LVEF of 50-55% with normal wall motion, mild LVH, and grade 1 diastolic dysfunction as well as normal RV function, mild AI, and mildly elevated PASP.   She was last seen by Dr. [name] on 02/07/2024 at which time she reported working with paramedicine and was doing okay. She reported stable NYHA III symptoms but volume status looked okay on exam and cardiomems.   Patient presented to the ED on 02/22/2024 for further evaluation of shortness of breath and abdominal swelling. EKG showed sinus rhythm with no acute ischemic changes. High-sensitivity troponin 73 >> 75. BNP elevated at 685.  Chest x-ray showed interstitial prominence in the lung possibly reflecting interstitial edema. WBC 6.1, Hgb 9.2, Plts 134. Na 133, K 6.6, Glucose 459, Cr 2.93. Albumin  4.6, AST 121, ALT 203, Alk Phos 108, Total Bili 0.5, Direct Bili 0.4, Indirect Bili 0.2. She was admitted for acute on chronic CHF and started on IV Lasix .   Patient states she started to notice fluid retention about 1 week ago.  She reports she usually holds fluid in her abdomen but  has noticed more diffuse edema at this time.  She states her legs, back, bottom, and face all feel swollen.  Her weight is up 15 lbs in 1 week.  [name] followed by paramedics and her home Torsemide  was increased but she had no improvement in symptoms.   She denies any chest pain, dyspnea, orthopnea, or PND.  No palpitations, lightheadedness, dizziness, or syncope.  No recent fevers or illnesses.  She has chronic abdominal pain and GI/GU issues with her chronic pancreatitis and gastroparesis but nothing new.  No abnormal bleeding in urine or stools.  She reports compliance with her home medications and reports following a low-sodium diet.  She also states that her blood sugars have been well-controlled at home after she cut out pasta, bread, and carbs.  Past Medical History:  Diagnosis Date   Afib (HCC) 05/12/2021   Brain tumor (HCC) 03/29/1995   astrocytoma   CHF (congestive heart failure) (HCC)    Cholesterosis    CKD (chronic kidney disease) stage 4, GFR 15-29 ml/min (HCC) 05/13/2021   DM (diabetes mellitus) (HCC) 10/10/2018   Fatty liver    Gastroparesis due to DM (HCC) 05/12/2021   HTN (hypertension) 10/10/2018   Hypothyroidism 10/10/2018   Lipoprotein deficiency    Lung disease    longevity long term   Pancreatitis    Polycystic ovary syndrome     Past Surgical History:  Procedure Laterality Date   ABDOMINAL SURGERY     pt states during miscarriage got her intestine   BRAIN SURGERY     ESOPHAGOGASTRODUODENOSCOPY N/A 09/01/2023   Procedure: EGD (ESOPHAGOGASTRODUODENOSCOPY);  Surgeon: Abran Norleen SAILOR, MD;  Location: [facility];  Service: Gastroenterology;  Laterality: N/A;   EYE MUSCLE SURGERY Right 03/28/2014   PRESSURE SENSOR/CARDIOMEMS N/A 12/06/2021   Procedure: PRESSURE SENSOR/CARDIOMEMS;  Surgeon: [name] Toribio SAUNDERS, MD;  Location: [facility];  Service: Cardiovascular;  Laterality: N/A;   RIGHT HEART CATH N/A 08/05/2021   Procedure: RIGHT HEART CATH;  Surgeon: [name] Toribio SAUNDERS, MD;  Location: [facility];  Service: Cardiovascular;  Laterality: N/A;   RIGHT HEART CATH N/A 12/06/2021   Procedure: RIGHT HEART CATH;  Surgeon: [name] Toribio SAUNDERS, MD;  Location: [facility];  Service: Cardiovascular;   Laterality: N/A;   RIGHT HEART CATH N/A 12/13/2023   Procedure: RIGHT HEART CATH;  Surgeon: Zenaida Morene PARAS, MD;  Location: [facility] CV [facility];  Service: Cardiovascular;  Laterality: N/A;   RIGHT/LEFT HEART CATH AND CORONARY ANGIOGRAPHY N/A 12/03/2020   Procedure: RIGHT/LEFT HEART CATH AND CORONARY ANGIOGRAPHY;  Surgeon: Ladona Heinz, MD;  Location: [facility];  Service: Cardiovascular;  Laterality: N/A;   VENTRICULOSTOMY  03/28/1997       Scheduled Meds:  allopurinol   100 mg Oral Daily   aspirin  EC  81 mg Oral Daily   atorvastatin   80 mg Oral QPM   doxazosin   2 mg Oral Daily   DULoxetine   60 mg Oral QHS   furosemide   40 mg Intravenous BID   heparin   5,000 Units Subcutaneous Q8H   hydrALAZINE   100 mg Oral Q8H   insulin  aspart  0-15 Units Subcutaneous TID WC   insulin  aspart  0-5 Units Subcutaneous QHS   insulin  aspart  5 Units Subcutaneous TID WC   insulin  regular human CONCENTRATED  200 Units Subcutaneous BID WC   isosorbide  mononitrate  60 mg Oral Daily   ivabradine   7.5 mg Oral BID WC   levothyroxine   150 mcg Oral  QAC breakfast   pregabalin   100 mg Oral Once   pregabalin   300 mg Oral QHS   sodium chloride  flush  3 mL Intravenous Q12H   Continuous Infusions:  sodium chloride      PRN Meds: sodium chloride , acetaminophen , sodium chloride  flush  Allergies:    Allergies  Allergen Reactions   Icosapent  Ethyl (Epa Ethyl Ester) (Fish) Hives   Ketamine Other (See Comments)    In a vegetative state for 15 minutes, per pt   Maitake Itching and Other (See Comments)    Itchy throat   Morphine  Hives, Itching, Rash and Other (See Comments)    Sore throat, too   Mushroom Other (See Comments)    Pt has never had mushrooms but tested positive on allergy test   Penicillins Hives, Itching and Rash    Tolerates cephalosporins   Shellfish Allergy Other (See Comments)    Pt has never had shellfish, but tested positive on allergy test. Pt states contrast in CT is okay.    Fentanyl  Nausea And Vomiting   Prednisone Rash    Social History:   Social History   Socioeconomic History   Marital status: Divorced    Spouse name: Not on file   Number of children: 0   Years of education: Not on file   Highest education level: Not on file  Occupational History   Occupation: Dance Movement Psychotherapist   Occupation: N/A  Tobacco Use   Smoking status: Never   Smokeless tobacco: Never  Vaping Use   Vaping status: Never Used  Substance and Sexual Activity   Alcohol  use: Never   Drug use: Never   Sexual activity: Yes  Other Topics Concern   Not on file  Social History Narrative   Not on file   Social Drivers of Health   Financial Resource Strain: Medium Risk (01/22/2024)   Received from [facility]   Overall Financial Resource Strain (CARDIA)    How hard is it for you to pay for the very basics like food, housing, medical care, and heating?: Somewhat hard  Food Insecurity: No Food Insecurity (02/23/2024)   Hunger Vital Sign    Worried About Running Out of Food in the Last Year: Never true    Ran Out of Food in the Last Year: Never true  Recent Concern: Food Insecurity - Food Insecurity Present (01/22/2024)   Received from [facility]   Hunger Vital Sign    Within the past 12 months, you worried that your food would run out before you got the money to buy more.: Sometimes true    Within the past 12 months, the food you bought just didn't last and you didn't have money to get more.: Sometimes true  Transportation Needs: No Transportation Needs (02/23/2024)   PRAPARE - Administrator, Civil Service (Medical): No    Lack of Transportation (Non-Medical): No  Recent Concern: Transportation Needs - Unmet Transportation Needs (01/22/2024)   Received from [facility] - Transportation    Lack of Transportation (Medical): Patient declined    In the past 12 months, has lack of transportation kept you from meetings, work, or from getting things  needed for daily living?: Yes  Physical Activity: Inactive (01/22/2024)   Received from [facility]   Exercise Vital Sign    On average, how many days per week do you engage in moderate to strenuous exercise (like a brisk walk)?: 0 days    Minutes of Exercise per Session: Not  on file  Stress: No Stress Concern Present (01/22/2024)   Received from [facility] - Occupational Stress Questionnaire    Do you feel stress - tense, restless, nervous, or anxious, or unable to sleep at night because your mind is troubled all the time - these days?: Not at all  Social Connections: Socially Integrated (01/22/2024)   Received from [facility]   Social Network    How would you rate your social network (family, work, friends)?: Good participation with social networks  Intimate Partner Violence: Not At Risk (02/23/2024)   Humiliation, Afraid, Rape, and Kick questionnaire    Fear of Current or Ex-Partner: No    Emotionally Abused: No    Physically Abused: No    Sexually Abused: No    Family History:   Family History  Problem Relation Age of Onset   Diabetes Mother    Hypertension Mother    Hyperlipidemia Mother    Thyroid  disease Mother    Hypertension Father    Diabetes Father    Pancreatic cancer Paternal Aunt    Pancreatic cancer Paternal Uncle    Colon cancer Neg Hx    Esophageal cancer Neg Hx    Stomach cancer Neg Hx      ROS:  Please see the history of present illness.   Physical Exam/Data: Vitals:   02/23/24 0357 02/23/24 0758 02/23/24 1156 02/23/24 1350  BP: 139/79 (!) 161/96 129/70 129/70  Pulse: (!) 104 92 88   Resp: 19 (!) 21 20   Temp: 98.6 F (37 C) 98 F (36.7 C) 98 F (36.7 C)   TempSrc: Oral Oral Oral   SpO2: 95% 97% 99%   Weight: 70.7 kg     Height: 4' 9 (1.448 m)       Intake/Output Summary (Last 24 hours) at 02/23/2024 1417 Last data filed at 02/23/2024 0900 Gross per 24 hour  Intake 180 ml  Output --   Net 180 ml      02/23/2024    3:57 AM 02/23/2024    1:21 AM 02/23/2024   12:00 AM  Last 3 Weights  Weight (lbs) 155 lb 12.8 oz 155 lb 12.8 oz 155 lb 12.8 oz  Weight (kg) 70.67 kg 70.67 kg 70.67 kg     Body mass index is 33.71 kg/m.  General: 33 y.o. [place] female resting comfortably in no acute distress. HEENT: Normocephalic and atraumatic. Sclera clear. EOMs intact. Neck: Supple. JVD not significantly elevated. Heart: RRR. No significant murmurs, gallops, or rubs.  Lungs: No increased work of breathing. Clear to ausculation bilaterally. No wheezes, rhonchi, or rales.  Abdomen: Distended abdomen but soft and non-tender. Extremities: Trace edema of right lower extremity edema (right leg chronically > left).   Skin: Warm and dry. Neuro: No focal deficits. Psych: Normal affect. Responds appropriately.   EKG:  The EKG was personally reviewed and demonstrates:  Normal sinsu rhytm with no acute ischemic changes. Telemetry:  Telemetry was personally reviewed and demonstrates:  Sinus rhythm with rates in the 90s to low 100s.  Relevant CV Studies:  Right Cardiac Catheterization 12/13/2023: Impressions: Right heart catheterization for evaluation of volume status and calibration of cardiomems Normal left and right heart filling pressures Significant respiratory variation, but cardiomems correlates well with RHC numbers Reduced cardiac output by assumed Fick, though PA saturation similar to previous cath in 2023 Elevated SVR of 2857dynes/sec*cm-5   Recommendations: Increase afterload reduction, hold diuretics today, start back orals tomorrow When evaluating  cardiomems, would look at the PA tracings and use the end expiratory values when looking at [facility] _______________  Echocardiogram 02/15/2024: Impressions: 1. Left ventricular ejection fraction, by estimation, is 50 to 55%. The  left ventricle has low normal function. The left ventricle has no regional  wall motion  abnormalities. There is mild concentric left ventricular  hypertrophy. Left ventricular  diastolic parameters are consistent with Grade I diastolic dysfunction  (impaired relaxation).   2. Right ventricular systolic function is normal. The right ventricular  size is normal. There is mildly elevated pulmonary artery systolic  pressure. The estimated right ventricular systolic pressure is 43.8 mmHg.   3. The mitral valve is normal in structure. Trivial mitral valve  regurgitation. No evidence of mitral stenosis.   4. The aortic valve is grossly normal. Aortic valve regurgitation is  mild. No aortic stenosis is present.   5. The inferior vena cava is dilated in size with >50% respiratory  variability, suggesting right atrial pressure of 8 mmHg.    Laboratory Data: High Sensitivity Troponin:  No results for input(s): TROPONINIHS in the last 720 hours.   Chemistry Recent Labs  Lab 02/22/24 2050 02/23/24 0135  NA 133* 136  K 6.6* 4.7  CL 94* 94*  CO2 23 27  GLUCOSE 459* 350*  BUN 55* 56*  CREATININE 2.93* 3.11*  CALCIUM  9.5 9.0  GFRNONAA 21* 20*  ANIONGAP 15 15    Recent Labs  Lab 02/22/24 2050  PROT 7.7  ALBUMIN  4.6  AST 121*  ALT 203*  ALKPHOS 108  BILITOT 0.5   Lipids No results for input(s): CHOL, TRIG, HDL, LABVLDL, LDLCALC, CHOLHDL in the last 168 hours.  Hematology Recent Labs  Lab 02/22/24 2050 02/23/24 0133  WBC 6.1 7.1  RBC 3.13* 3.17*  HGB 9.2* 9.3*  HCT 29.1* 29.2*  MCV 93.0 92.1  MCH 29.4 29.3  MCHC 31.6 31.8  RDW 15.2 15.0  PLT 134* 133*   Thyroid  No results for input(s): TSH, FREET4 in the last 168 hours.  BNP Recent Labs  Lab 02/22/24 2050  PROBNP 685.0*    DDimer No results for input(s): DDIMER in the last 168 hours.  Radiology/Studies:  DG Chest Port 1 View Result Date: 02/22/2024 EXAM: 1 VIEW(S) XRAY OF THE CHEST 02/22/2024 07:36:00 PM COMPARISON: 12/28/2023 CLINICAL HISTORY: sob FINDINGS: LUNGS AND PLEURA:  Interstitial prominence in the lungs could reflect interstitial edema. No pleural effusion. No pneumothorax. HEART AND MEDIASTINUM: Cardiomegaly. BONES AND SOFT TISSUES: No acute osseous abnormality. IMPRESSION: 1. Interstitial prominence in the lungs, possibly reflecting interstitial edema. 2. Cardiomegaly. Electronically signed by: Franky Crease MD 02/22/2024 07:43 PM EST RP Workstation: [id number]     Assessment and Plan:  Acute on Chronic HFrEF Non-Ischemic Cardiomyopathy Patient has a long history of non-ischemic cardiomyopathy with EF as low as 25-30% in 2022 but has subsequently normalized. She has had multiple hospitalizations over the years for acute on chronic CHF and had cardiomems placed in 2023. She was admitted in 11/2023 and again in 12/2023 for this. RHC during admission in 11/2023 showed reduced cardiac output by assumed Fick (though PA sat was similar to prior cath in 2023), severely elevated of SVR, and normal left/ right filling pressures. Cardiomems correlated well with RHC numbers.  Recent Echo on 02/05/2024 showed LVEF of 50-55% with normal wall motion, mild LVH, and grade 1 diastolic dysfunction as well as normal RV function, mild AI, and mildly elevated PASP. BNP elevated at 685.  Chest x-ray showed interstitial prominence in  the lung possibly reflecting interstitial edema. She was started on IV Lasix . No documented urinary output so far. - Volume overloaded. Weight is currently 155 lbs which is up from 145 lbs on recent discharge in 12/2023.  - GDMT limited by renal function.  - Will increase IV Lasix  to 120mg  twice daily (this is the dose she was started on during last admission). - Continue Imdur  60mg  daily and Hydralazine  100mg  three times daily.  - Continue Ivabradine  6.5mg  twice daily.  - Not on ACEi/ ARB/ ARNI or MRA due to renal function and recurrent hyperkalemia. - Not on SGLT2 inhibitor given renal function and uncontrolled diabetes. - Not on beta-blocker given low  cardiac output on recent RHC. - Continue daily weights, strict I/Os, and renal function.  Pulmonary Hypertension Mild to moderate pulmonary hypertension on RHC in 07/2021. Prior VQ scan negative. Recent RHC in 11/2023 showed 11/2023 showed reduced cardiac output by assumed Fick (though PA sat was similar to prior cath in 2023), severely elevated of SVR, and normal left/ right filling pressures.   - Previously did not tolerate Sildenafil  in the past due to dizziness and low BP. - Continue diuresis as above. - Of note, she also has obstructive sleep apnea but is non-compliant with her PCP.  Elevated Troponin CAD LHC in 2022 showed single vessel RCA occlusion (small co-dominant vessel). Medical therapy was recommended. High-sensitivity troponin minimally elevated but flat in the 70s. - No angina.  - Continue Aspirin  81mg  daily and Lipitor  80mg  daily. - Troponin elevation consistent with demand ischemia in setting of acute CHF. No ischemic work-up planned.  Hypertension BP initially markedly elevated with systolic BP of 189.  Improved this morning. - Continue current medications: Imdur  60mg  daily, Hydralazine  100mg  three times daily, and Cardura  2mg  daily.  Hyperlipidemia Lipid panel in 11/2023: Total Cholesterol 798 and Triglycerides >4,425. Unable to calculate HDL or LDL given markedly elevated triglycerides. Direct LDL 48.  - Continue Lipitor  80mg  daily.  - Previously on Fenofibrate  but this was stopped on admission (presuming due to eGFR <30).  - Previously seen by Dr. Mona. Recommend she follow back up with after discharge.  CKD Stage IV Renal biopsy in 12/2021 showed DM and HTN nephropathy. Baseline creatinine around 3.0. Creatinine 2.93 on admission. - Creatinine 3.11 today. - Continue to monitor closely with diuresis.  Hyperkalemia Potassium 6.6 on admission. Treated with Sodium Bicarb, Insulin , and Lokelma . - Improved to 4.7 today. - Continue to monitor closely.   Otherwise, per  primary team: - Uncontrolled diabetes mellitus - Hypothyroidism  - Chronic pericarditis - Chronically elevated LFTs - Hyponatremia - Obstructive sleep apnea   Risk Assessment/Risk Scores:   [place]  Heart Association (NYHA) Functional Class NYHA Class III  For questions or updates, please contact  HeartCare Please consult [email or web address] for contact info under   Signed, Lyndsee Casa E Annisten Manchester, PA-C  02/23/2024 2:17 PM

## 2024-02-23 NOTE — H&P (Incomplete)
 History and Physical    Patient: Jennifer [name] FMW:[id number] DOB: [birth year] DOA: 02/22/2024 DOS: the patient was seen and examined on 02/23/2024 PCP: Emilio Joesph DEL, PA-C  Patient coming from: {Point_of_Origin:[address]}  Chief Complaint:  Chief Complaint  Patient presents with  . Shortness of Breath   HPI: Jennifer [name] is a 33 y.o. female with medical history significant of ***  Review of Systems: {ROS_Text:26778} Past Medical History:  Diagnosis Date  . Afib (HCC) 05/12/2021  . Brain tumor (HCC) 03/29/1995   astrocytoma  . CHF (congestive heart failure) (HCC)   . Cholesterosis   . CKD (chronic kidney disease) stage 4, GFR 15-29 ml/min (HCC) 05/13/2021  . DM (diabetes mellitus) (HCC) 10/10/2018  . Fatty liver   . Gastroparesis due to DM (HCC) 05/12/2021  . HTN (hypertension) 10/10/2018  . Hypothyroidism 10/10/2018  . Lipoprotein deficiency   . Lung disease    longevity long term  . Pancreatitis   . Polycystic ovary syndrome    Past Surgical History:  Procedure Laterality Date  . ABDOMINAL SURGERY     pt states during miscarriage got her intestine  . BRAIN SURGERY    . ESOPHAGOGASTRODUODENOSCOPY N/A 09/01/2023   Procedure: EGD (ESOPHAGOGASTRODUODENOSCOPY);  Surgeon: Abran Norleen SAILOR, MD;  Location: [facility];  Service: Gastroenterology;  Laterality: N/A;  . EYE MUSCLE SURGERY Right 03/28/2014  . PRESSURE SENSOR/CARDIOMEMS N/A 12/06/2021   Procedure: PRESSURE SENSOR/CARDIOMEMS;  Surgeon: [name] Toribio SAUNDERS, MD;  Location: [facility];  Service: Cardiovascular;  Laterality: N/A;  . RIGHT HEART CATH N/A 08/05/2021   Procedure: RIGHT HEART CATH;  Surgeon: [name] Toribio SAUNDERS, MD;  Location: [facility];  Service: Cardiovascular;  Laterality: N/A;  . RIGHT HEART CATH N/A 12/06/2021   Procedure: RIGHT HEART CATH;  Surgeon: [name] Toribio SAUNDERS, MD;  Location: [facility];  Service: Cardiovascular;  Laterality: N/A;  . RIGHT HEART CATH  N/A 12/13/2023   Procedure: RIGHT HEART CATH;  Surgeon: Zenaida Morene PARAS, MD;  Location: [facility] CV [facility];  Service: Cardiovascular;  Laterality: N/A;  . RIGHT/LEFT HEART CATH AND CORONARY ANGIOGRAPHY N/A 12/03/2020   Procedure: RIGHT/LEFT HEART CATH AND CORONARY ANGIOGRAPHY;  Surgeon: Ladona Heinz, MD;  Location: [facility];  Service: Cardiovascular;  Laterality: N/A;  . VENTRICULOSTOMY  03/28/1997   Social History:  reports that she has never smoked. She has never used smokeless tobacco. She reports that she does not drink alcohol  and does not use drugs.  Allergies  Allergen Reactions  . Icosapent  Ethyl (Epa Ethyl Ester) (Fish) Hives  . Ketamine Other (See Comments)    In a vegetative state for 15 minutes, per pt  . [place] Itching and Other (See Comments)    Itchy throat  . Morphine  Hives, Itching, Rash and Other (See Comments)    Sore throat, too  . Mushroom Other (See Comments)    Pt has never had mushrooms but tested positive on allergy test  . Penicillins Hives, Itching and Rash    Tolerates cephalosporins  . Shellfish Allergy Other (See Comments)    Pt has never had shellfish, but tested positive on allergy test. Pt states contrast in CT is okay.  . Fentanyl  Nausea And Vomiting  . Prednisone Rash    Family History  Problem Relation Age of Onset  . Diabetes Mother   . Hypertension Mother   . Hyperlipidemia Mother   . Thyroid  disease Mother   . Hypertension Father   . Diabetes Father   .  Pancreatic cancer Paternal Aunt   . Pancreatic cancer Paternal Uncle   . Colon cancer Neg Hx   . Esophageal cancer Neg Hx   . Stomach cancer Neg Hx     Prior to Admission medications   Medication Sig Start Date End Date Taking? Authorizing Provider  albuterol  (VENTOLIN  HFA) 108 (90 Base) MCG/ACT inhaler Inhale 2 puffs into the lungs daily as needed for wheezing or shortness of breath. 10/03/23   [provider]  allopurinol  (ZYLOPRIM ) 100 MG tablet Take 1 tablet (100  mg total) by mouth daily. 02/21/24   [name], Toribio SAUNDERS, MD  amitriptyline  (ELAVIL ) 10 MG tablet Take 10 mg by mouth. Patient taking differently: Take 10 mg by mouth at bedtime. 02/01/24   [provider]  aspirin  EC 81 MG tablet Take 1 tablet (81 mg total) by mouth daily. Swallow whole. 12/18/23   [name], Toribio SAUNDERS, MD  Atogepant 60 MG TABS Take 60 mg by mouth. Patient not taking: Reported on 02/07/2024 02/01/24   [provider]  atorvastatin  (LIPITOR ) 80 MG tablet Take 1 tablet (80 mg total) by mouth every evening. 02/12/24   Milford, Harlene HERO, FNP  azelastine  (ASTELIN ) 0.1 % nasal spray Place 2 sprays into both nostrils daily as needed for rhinitis or allergies.    [provider]  BAQSIMI ONE PACK 3 MG/DOSE POWD Place 1 Dose into the nose once as needed (hypoglycemia). 07/31/23   [provider]  calcitRIOL  (ROCALTROL ) 0.25 MCG capsule Take 0.25 mcg by mouth daily. 08/04/23   [provider]  dicyclomine  (BENTYL ) 20 MG tablet Take 1 tablet (20 mg total) by mouth 2 (two) times daily. 09/05/23   Dreama Longs, MD  doxazosin  (CARDURA ) 2 MG tablet Take 1 tablet (2 mg total) by mouth daily. 02/07/24   [name], Toribio SAUNDERS, MD  DULoxetine  (CYMBALTA ) 60 MG capsule Take 60 mg by mouth at bedtime. 09/26/22   [provider]  ergocalciferol  (VITAMIN D2) 1.25 MG (50000 UT) capsule Take 1 capsule (50,000 Units total) by mouth every Sunday. 11/12/23   Lee, Jordan, NP  fenofibrate  54 MG tablet Take 1 tablet (54 mg total) by mouth daily. Patient taking differently: Take 54 mg by mouth at bedtime. 01/03/24   Arrien, Elidia Toribio, MD  Glucose Blood (BLOOD GLUCOSE TEST STRIPS) STRP 1 each by Does not apply route 3 (three) times daily. Use as directed to check blood sugar. May dispense any manufacturer covered by patient's insurance and fits patient's device. 08/26/23   Shalhoub, Zachary PARAS, MD  HUMALOG  KWIKPEN 100 UNIT/ML KwikPen Inject 60 Units into the skin in  the morning, at noon, and at bedtime.    [provider]  HUMULIN  R U-500 KWIKPEN 500 UNIT/ML KwikPen Inject 300 Units into the skin in the morning and at bedtime.    [provider]  hydrALAZINE  (APRESOLINE ) 100 MG tablet Take 1 tablet (100 mg total) by mouth every 8 (eight) hours. 01/02/24   Arrien, Mauricio Daniel, MD  Insulin  Pen Needle (PEN NEEDLES) 31G X 5 MM MISC Use as directed 3 (three) times daily. 08/26/23   Shalhoub, Zachary PARAS, MD  isosorbide  mononitrate (IMDUR ) 60 MG 24 hr tablet Take 1 tablet (60 mg total) by mouth daily. 01/03/24   Arrien, Elidia Toribio, MD  ivabradine  (CORLANOR ) 7.5 MG TABS tablet Take 1 tablet (7.5 mg total) by mouth 2 (two) times daily with a meal. 01/08/24   Milford, Harlene HERO, FNP  Lancet Device MISC 1 each by Does not apply  route 3 (three) times daily. May dispense any manufacturer covered by patient's insurance. 08/26/23   Shalhoub, Zachary PARAS, MD  Lancets MISC 1 each by Does not apply route 3 (three) times daily. Use as directed to check blood sugar. May dispense any manufacturer covered by patient's insurance and fits patient's device. 08/26/23   Shalhoub, Zachary PARAS, MD  lansoprazole  (PREVACID ) 30 MG capsule TAKE 1 CAPSULE (30 MG TOTAL) BY MOUTH 2 (TWO) TIMES DAILY BEFORE A MEAL. 02/05/24   May, Deanna J, NP  levocetirizine (XYZAL ) 5 MG tablet Take 5 mg by mouth at bedtime. 03/05/22   [provider]  levothyroxine  (SYNTHROID ) 150 MCG tablet Take 150 mcg by mouth daily before breakfast.    [provider]  methylPREDNISolone  (MEDROL  DOSEPAK) 4 MG TBPK tablet Take as directed 02/08/24   Tobie Franky SQUIBB, DPM  pregabalin  (LYRICA ) 300 MG capsule Take 300 mg by mouth at bedtime.    [provider]  promethazine  (PHENERGAN ) 25 MG tablet Take 25 mg by mouth at bedtime.    [provider]  Safety Seal Miscellaneous MISC Hormonic Hair Solution with minoxidil USP 7% and finasteride USP 0.05% - apply to affected areas daily  every morning. 06/06/23   Alm Delon SAILOR, DO  SUMAtriptan  (IMITREX ) 100 MG tablet Take 100 mg by mouth daily as needed. 10/11/23   [provider]  torsemide  (DEMADEX ) 20 MG tablet Take 3 tablets (60 mg total) by mouth daily. 12/16/23   Zenaida Morene PARAS, MD  tretinoin  (RETIN-A ) 0.05 % cream Apply 1 Application topically at bedtime.    [provider]    Physical Exam: Vitals:   02/22/24 2305 02/22/24 2315 02/22/24 2317 02/22/24 2322  BP: (!) 170/92 139/76    Pulse:  (!) 106 89   Resp: (!) 26 (!) 26 (!) 24   Temp:    98 F (36.7 C)  TempSrc:    Oral  SpO2:  95% 96%    *** Data Reviewed: {Tip this will not be part of the note when signed- Document your independent interpretation of telemetry tracing, EKG, lab, Radiology test or any other diagnostic tests. Add any new diagnostic test ordered today. (Optional):26781} {Results:26384}  Assessment and Plan: No notes have been filed under this hospital service. Service: Hospitalist     Advance Care Planning:   Code Status: Prior ***  Consults: ***  Family Communication: ***  Severity of Illness: {Observation/Inpatient:21159}  AuthorBETHA SIM KNOLL, MD 02/23/2024 12:02 AM  For on call review [email or web address].

## 2024-02-23 NOTE — Inpatient Diabetes Management (Signed)
 Inpatient Diabetes Program Recommendations  AACE/ADA: New Consensus Statement on Inpatient Glycemic Control (2015)  Target Ranges:  Prepandial:   less than 140 mg/dL      Peak postprandial:   less than 180 mg/dL (1-2 hours)      Critically ill patients:  140 - 180 mg/dL   Lab Results  Component Value Date   GLUCAP 375 (H) 02/23/2024   HGBA1C 8.8 (H) 09/28/2023   Diabetes history: DM  Outpatient Diabetes medications:  U500 insulin  300 units bid- dose verified with patient  Humalog  60 units tid with meals (2 units per 1 gram of CHO) Grams of carbs consumed Humalog  dose 10 grams 20 units 20 grams 40 units 30 grams 60 units 40 grams 80 units 50 grams 100 units  60 grams 120 units 70 grams 140 units 80 grams 160 units  Current orders for Inpatient glycemic control:  U-500 200 units bid ac meals Novolog  0-15 units tid with meals, 0-5 units hs   Inpatient Diabetes Program Recommendations:   If postprandial CBGs continue >180, please consider: -Add Novolog  10 units tid meal coverage if eats 50%   Patient sees endocrinology at [facility] and takes on average 480 units of insulin  daily.  She has extreme insulin  resistance.   Thank you, Christan Ciccarelli E. Marjarie Irion, RN, MSN, CNS, CDCES  Diabetes Coordinator Inpatient Glycemic Control Team Team Pager [phone] (8am-5pm) 02/23/2024 11:49 AM

## 2024-02-24 DIAGNOSIS — E7800 Pure hypercholesterolemia, unspecified: Secondary | ICD-10-CM

## 2024-02-24 DIAGNOSIS — I2510 Atherosclerotic heart disease of native coronary artery without angina pectoris: Secondary | ICD-10-CM

## 2024-02-24 DIAGNOSIS — I428 Other cardiomyopathies: Secondary | ICD-10-CM | POA: Diagnosis not present

## 2024-02-24 DIAGNOSIS — E8770 Fluid overload, unspecified: Secondary | ICD-10-CM | POA: Diagnosis not present

## 2024-02-24 DIAGNOSIS — I272 Pulmonary hypertension, unspecified: Secondary | ICD-10-CM | POA: Diagnosis not present

## 2024-02-24 DIAGNOSIS — I10 Essential (primary) hypertension: Secondary | ICD-10-CM

## 2024-02-24 DIAGNOSIS — I2583 Coronary atherosclerosis due to lipid rich plaque: Secondary | ICD-10-CM

## 2024-02-24 DIAGNOSIS — I5023 Acute on chronic systolic (congestive) heart failure: Secondary | ICD-10-CM

## 2024-02-24 DIAGNOSIS — E781 Pure hyperglyceridemia: Secondary | ICD-10-CM

## 2024-02-24 LAB — BASIC METABOLIC PANEL WITH GFR
Anion gap: 17 — ABNORMAL HIGH (ref 5–15)
BUN: 65 mg/dL — ABNORMAL HIGH (ref 6–20)
CO2: 28 mmol/L (ref 22–32)
Calcium: 9.1 mg/dL (ref 8.9–10.3)
Chloride: 92 mmol/L — ABNORMAL LOW (ref 98–111)
Creatinine, Ser: 3.24 mg/dL — ABNORMAL HIGH (ref 0.44–1.00)
GFR, Estimated: 19 mL/min — ABNORMAL LOW (ref >60)
Glucose, Bld: 120 mg/dL — ABNORMAL HIGH (ref 70–99)
Potassium: 3.8 mmol/L (ref 3.5–5.1)
Sodium: 137 mmol/L (ref 135–145)

## 2024-02-24 LAB — GLUCOSE, CAPILLARY
Glucose-Capillary: 99 mg/dL (ref 70–99)
Glucose-Capillary: 214 mg/dL — ABNORMAL HIGH (ref 70–99)
Glucose-Capillary: 145 mg/dL — ABNORMAL HIGH (ref 70–99)
Glucose-Capillary: 145 mg/dL — ABNORMAL HIGH (ref 70–99)

## 2024-02-24 LAB — LIPID PANEL
Cholesterol: 326 mg/dL — ABNORMAL HIGH (ref 0–200)
HDL: 52 mg/dL (ref >40)
LDL Cholesterol: UNDETERMINED mg/dL (ref 0–99)
Total CHOL/HDL Ratio: 6.3 ratio
Triglycerides: 989 mg/dL — ABNORMAL HIGH (ref <150)
VLDL: UNDETERMINED mg/dL (ref 0–40)

## 2024-02-24 LAB — I-STAT VENOUS BLOOD GAS, ED
Acid-Base Excess: 0 mmol/L (ref 0.0–2.0)
Bicarbonate: 25.5 mmol/L (ref 20.0–28.0)
Calcium, Ion: 1.16 mmol/L (ref 1.15–1.40)
HCT: 36 % (ref 36.0–46.0)
Hemoglobin: 12.2 g/dL (ref 12.0–15.0)
O2 Saturation: 78 %
Patient temperature: 98.1
Potassium: 5.9 mmol/L — ABNORMAL HIGH (ref 3.5–5.1)
Sodium: 133 mmol/L — ABNORMAL LOW (ref 135–145)
TCO2: 27 mmol/L (ref 22–32)
pCO2, Ven: 43.2 mmHg — ABNORMAL LOW (ref 44–60)
pH, Ven: 7.377 (ref 7.25–7.43)
pO2, Ven: 43 mmHg (ref 32–45)

## 2024-02-24 MED ORDER — TORSEMIDE 20 MG PO TABS
80.0000 mg | ORAL_TABLET | Freq: Every day | ORAL | Status: DC
  Administered 2024-02-25: 80 mg via ORAL
  Filled 2024-02-24: qty 4

## 2024-02-24 MED ORDER — DIPHENHYDRAMINE HCL 50 MG/ML IJ SOLN
12.5000 mg | Freq: Once | INTRAMUSCULAR | Status: AC | PRN
  Administered 2024-02-24: 12.5 mg via INTRAVENOUS
  Filled 2024-02-24: qty 1

## 2024-02-24 NOTE — Progress Notes (Signed)
 PROGRESS NOTE    Jennifer [name]  FMW:[id number] DOB: 17-Oct-1990 DOA: 02/22/2024 PCP: [name]   Brief Narrative: 33 year old with past medical history significant for astrocytoma s/p resection 1997 with history of chronic heart failure reduced ejection fraction, severe hypertriglyceridemia due to LPL deficiency, chronic pancreatitis presented with worsening dyspnea, 10 to 15 pounds weight gain recently.  She has been taking torsemide  without significant improvement. Evaluation in the ED labs consistent with hyperkalemia, mild elevated troponin transaminases hyperglycemia.    Assessment & Plan:   Principal Problem:   Acute CHF (congestive heart failure) (HCC) Active Problems:   Acute on chronic diastolic CHF (congestive heart failure) (HCC)   Elevated troponin   Hyperglycemia in setting of T2DM   Primary hypertension   Acute renal failure superimposed on stage 3b chronic kidney disease (HCC)   CKD stage 3b, GFR 30-44 ml/min (HCC)   Coronary artery disease due to lipid rich plaque   History of astrocytoma of brain   Type 2 diabetes mellitus with hyperlipidemia (HCC)   Hypothyroidism   Sleep apnea   Hyperkalemia   NASH (nonalcoholic steatohepatitis)   Hyponatremia   Polycystic ovary syndrome   Hypertriglyceridemia   Acute on chronic heart failure with normal ejection fraction (HCC)   CKD (chronic kidney disease) stage 4, GFR 15-29 ml/min (HCC)   Hyperglycemia due to diabetes mellitus (HCC)   Pulmonary HTN (HCC)   NICM (nonischemic cardiomyopathy) (HCC)   Pure hypercholesterolemia   1-Acute on chronic diastolic heart failure - Presented with dyspnea, 15 pounds weight gain, proBNP 685.  Chest x-ray with interstitial edema - Continue IV Lasix -- increase to 120 mg BID yesterday.  - Daily weight and strict I's and O's - Cardiology consulted - On ivabradine  Weight down to 151 from 155 Urine out put not documented.  Follow cardiology recommendations.    Chronic kidney failure stage IIIb - Previous creatinine 1.6--- 3.4 - Continue to monitor renal function on diuretics Stable.   Insulin -dependent diabetes hyperglycemia uncontrolled - Continue 200 units Humulin  R - Continue scale insulin  - Continue with  meal coverage  Hypothyroidism;  Continue with Synthroid .   Elevated troponin -flat in setting CKD and HF>    Essential hypertension - Continue  hydralazine  and Lasix , Cardura   Hyponatremia - Monitor on diuretics   Chronic transaminases - Hepatic congestion in the setting of heart failure   History of astrocytoma of the brain in remission previously treated, as child.  - Follow-up as an outpatient  Obstructive sleep apnea - CPAP  Hyperkalemia: Received sodium bicarb and insulin  in the ED.   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   Weight as of this encounter: 68.5 kg.   DVT prophylaxis: Heparin  Code Status: Full code Family Communication: Care discussed with patient Disposition Plan:  Status is: Inpatient Remains inpatient appropriate because: Management of heart failure    Consultants:  Cardiology  Procedures:  None Antimicrobials:    Subjective: Breathing better, no new complaints.   Objective: Vitals:   02/24/24 0500 02/24/24 0742 02/24/24 0904 02/24/24 1100  BP:  128/72 128/72 128/66  Pulse:  (!) 101  96  Resp:  20  20  Temp:  98.7 F (37.1 C)  98.1 F (36.7 C)  TempSrc:  Oral  Oral  SpO2:  94%  95%  Weight: 68.5 kg     Height:        Intake/Output Summary (Last 24 hours) at 02/24/2024 1259 Last data filed at 02/24/2024 0553 Gross per 24 hour  Intake 350 ml  Output --  Net 350 ml   Filed Weights   02/23/24 0121 02/23/24 0357 02/24/24 0500   Weight: 70.7 kg 70.7 kg 68.5 kg    Examination:  General exam: NAD Respiratory system: BL crackles.  Cardiovascular system: S 1, S 2 RRR Gastrointestinal system: BS present, soft, nt Central nervous system:alert Extremities: Symmetric 5 x 5 power.    Data Reviewed: I have personally reviewed following labs and imaging studies  CBC: Recent Labs  Lab 02/22/24 2050 02/22/24 2230 02/23/24 0133  WBC 6.1  --  7.1  NEUTROABS 3.8  --   --   HGB 9.2* 12.2 9.3*  HCT 29.1* 36.0 29.2*  MCV 93.0  --  92.1  PLT 134*  --  133*   Basic Metabolic Panel: Recent Labs  Lab 02/22/24 2050 02/22/24 2230 02/23/24 0135 02/24/24 0158  NA 133* 133* 136 137  K 6.6* 5.9* 4.7 3.8  CL 94*  --  94* 92*  CO2 23  --  27 28  GLUCOSE 459*  --  350* 120*  BUN 55*  --  56* 65*  CREATININE 2.93*  --  3.11* 3.24*  CALCIUM  9.5  --  9.0 9.1   GFR: Estimated Creatinine Clearance: 19.9 mL/min (A) (by C-G formula based on SCr of 3.24 mg/dL (H)). Liver Function Tests: Recent Labs  Lab 02/22/24 2050  AST 121*  ALT 203*  ALKPHOS 108  BILITOT 0.5  PROT 7.7  ALBUMIN  4.6   No results for input(s): LIPASE, AMYLASE in the last 168 hours. No results for input(s): AMMONIA in the last 168 hours. Coagulation Profile: No results for input(s): INR, PROTIME in the last 168 hours. Cardiac Enzymes: No results for input(s): CKTOTAL, CKMB, CKMBINDEX, TROPONINI in the last 168 hours. BNP (last 3 results) Recent Labs    12/07/23 0018 02/22/24 2050  PROBNP 433.0* 685.0*   HbA1C: No results for input(s): HGBA1C in the last 72 hours. CBG: Recent Labs  Lab 02/23/24 1154 02/23/24 1639 02/23/24 2124 02/24/24 0605 02/24/24 1123  GLUCAP 141* 116* 85 99 214*   Lipid Profile: Recent Labs    02/24/24 0158  CHOL 326*  HDL 52  LDLCALC UNABLE TO CALCULATE IF TRIGLYCERIDE OVER 400 mg/dL  TRIG 010*  CHOLHDL 6.3   Thyroid  Function Tests: No results for input(s): TSH, T4TOTAL,  FREET4, T3FREE, THYROIDAB in the last 72 hours. Anemia Panel: No results for input(s): VITAMINB12, FOLATE, FERRITIN, TIBC, IRON, RETICCTPCT in the last 72 hours. Sepsis Labs: No results for input(s): PROCALCITON, LATICACIDVEN in the last 168 hours.  Recent Results (from the past 240 hours)  Resp panel by RT-PCR (RSV, Flu A&B, Covid) Anterior Nasal Swab     Status: None   Collection Time: 02/22/24  9:00 PM   Specimen: Anterior Nasal Swab  Result Value Ref Range Status   SARS Coronavirus 2 by RT PCR NEGATIVE NEGATIVE Final    Comment: (NOTE) SARS-CoV-2 target nucleic acids are NOT DETECTED.  The SARS-CoV-2 RNA is generally detectable in upper respiratory specimens during the acute phase of infection. The lowest concentration of SARS-CoV-2 viral copies this assay can detect is 138 copies/mL. A negative result does not preclude SARS-Cov-2 infection and should not be used as the sole basis for treatment  or other patient management decisions. A negative result may occur with  improper specimen collection/handling, submission of specimen other than nasopharyngeal swab, presence of viral mutation(s) within the areas targeted by this assay, and inadequate number of viral copies(<138 copies/mL). A negative result must be combined with clinical observations, patient history, and epidemiological information. The expected result is Negative.  Fact Sheet for Patients:  bloggercourse.com  Fact Sheet for Healthcare Providers:  seriousbroker.it  This test is no t yet approved or cleared by the [place]  FDA and  has been authorized for detection and/or diagnosis of SARS-CoV-2 by FDA under an Emergency Use Authorization (EUA). This EUA will remain  in effect (meaning this test can be used) for the duration of the COVID-19 declaration under Section 564(b)(1) of the Act, 21 U.S.C.section 360bbb-3(b)(1), unless the  authorization is terminated  or revoked sooner.       Influenza A by PCR NEGATIVE NEGATIVE Final   Influenza B by PCR NEGATIVE NEGATIVE Final    Comment: (NOTE) The Xpert Xpress SARS-CoV-2/FLU/RSV plus assay is intended as an aid in the diagnosis of influenza from Nasopharyngeal swab specimens and should not be used as a sole basis for treatment. Nasal washings and aspirates are unacceptable for Xpert Xpress SARS-CoV-2/FLU/RSV testing.  Fact Sheet for Patients: bloggercourse.com  Fact Sheet for Healthcare Providers: seriousbroker.it  This test is not yet approved or cleared by the [place]  FDA and has been authorized for detection and/or diagnosis of SARS-CoV-2 by FDA under an Emergency Use Authorization (EUA). This EUA will remain in effect (meaning this test can be used) for the duration of the COVID-19 declaration under Section 564(b)(1) of the Act, 21 U.S.C. section 360bbb-3(b)(1), unless the authorization is terminated or revoked.     Resp Syncytial Virus by PCR NEGATIVE NEGATIVE Final    Comment: (NOTE) Fact Sheet for Patients: bloggercourse.com  Fact Sheet for Healthcare Providers: seriousbroker.it  This test is not yet approved or cleared by the [place]  FDA and has been authorized for detection and/or diagnosis of SARS-CoV-2 by FDA under an Emergency Use Authorization (EUA). This EUA will remain in effect (meaning this test can be used) for the duration of the COVID-19 declaration under Section 564(b)(1) of the Act, 21 U.S.C. section 360bbb-3(b)(1), unless the authorization is terminated or revoked.  Performed at [facility], [address] [place], [place] [address]          Radiology Studies: DG Chest [facility] 1 View Result Date: 02/22/2024 EXAM: 1 VIEW(S) XRAY OF THE CHEST 02/22/2024 07:36:00 PM COMPARISON: 12/28/2023  CLINICAL HISTORY: sob FINDINGS: LUNGS AND PLEURA: Interstitial prominence in the lungs could reflect interstitial edema. No pleural effusion. No pneumothorax. HEART AND MEDIASTINUM: Cardiomegaly. BONES AND SOFT TISSUES: No acute osseous abnormality. IMPRESSION: 1. Interstitial prominence in the lungs, possibly reflecting interstitial edema. 2. Cardiomegaly. Electronically signed by: Kevin Dover MD 02/22/2024 07:43 PM EST RP Workstation: [id number]        Scheduled Meds:  allopurinol   100 mg Oral Daily   aspirin  EC  81 mg Oral Daily   atorvastatin   80 mg Oral QPM   doxazosin   2 mg Oral Daily   DULoxetine   60 mg Oral QHS   heparin   5,000 Units Subcutaneous Q8H   hydrALAZINE   100 mg Oral Q8H   insulin  aspart  0-15 Units Subcutaneous TID WC   insulin  aspart  0-5 Units Subcutaneous QHS   insulin  aspart  5 Units Subcutaneous TID WC   insulin  regular human CONCENTRATED  200  Units Subcutaneous BID WC   isosorbide  mononitrate  60 mg Oral Daily   ivabradine   7.5 mg Oral BID WC   levothyroxine   150 mcg Oral QAC breakfast   pregabalin   300 mg Oral QHS   sodium chloride  flush  3 mL Intravenous Q12H   [START ON 02/25/2024] torsemide   80 mg Oral Daily   Continuous Infusions:     LOS: 2 days    Time spent: 35 Minutes    Taisha Pennebaker A Travion Ke, MD [facility]   If 7PM-7AM, please contact night-coverage [email or web address]  02/24/2024, 12:59 PM

## 2024-02-24 NOTE — Consult Note (Addendum)
 Cardiology Progress Note   Patient ID: Jennifer [name] MRN: [id number]; DOB: Jul 17, 1990  Admit date: 02/22/2024 Date of Consult: 02/24/2024  PCP:  Emilio Joesph DEL, PA-C   [facility] Providers Cardiologist:  [name], MD        Patient Profile: Jennifer [name] is a 33 y.o. female with a history of CAD with occluded RCA noted on cardiac catheterization in 11/2020 (treated medically), chronic HFrEF with EF as low as 25-30% in 2022 with subsequent normalization to 50-55% in 01/2024, pulmonary hypertension, systemic hypertension, hyperlipidemia with severe hypertriglyceridemia due to LDL deficiency, insulin -dependent diabetes mellitus with gastroparesis, chronic pancreatitis, non-alcoholic fatty liver disease, CKD stage IV, hypothyroidism, obstructive sleep apnea unable to tolerate CPAP,  astrocytoma s/p resection in 1997 who is being seen 02/24/2024 for the evaluation of CHF at the request of Dr. Madelyne.  Dx with acute on chronic HFpEF in setting of NICM with recovered EF, CKD stage 4 and PHTN.  At baseline she is NYHA stage 3. Most recent d/c weight 64.4kg on 01/02/24 and on this admit 70kg. Gained 10-15lbs recently.   Started on IV Lasix  but I&O's are incomplete.  SCr bumped yesterday from 3.11 to 3.24; K+ 3.8.Wt down from 155.8 to 151kg this am.   No chest pain. SOB improved.  Past Medical History:  Diagnosis Date   Afib (HCC) 05/12/2021   Brain tumor (HCC) 03/29/1995   astrocytoma   CHF (congestive heart failure) (HCC)    Cholesterosis    CKD (chronic kidney disease) stage 4, GFR 15-29 ml/min (HCC) 05/13/2021   DM (diabetes mellitus) (HCC) 10/10/2018   Fatty liver    Gastroparesis due to DM (HCC) 05/12/2021   HTN (hypertension) 10/10/2018   Hypothyroidism 10/10/2018   Lipoprotein deficiency    Lung disease    longevity long term   Pancreatitis    Polycystic ovary syndrome     Past Surgical History:  Procedure Laterality Date   ABDOMINAL  SURGERY     pt states during miscarriage got her intestine   BRAIN SURGERY     ESOPHAGOGASTRODUODENOSCOPY N/A 09/01/2023   Procedure: EGD (ESOPHAGOGASTRODUODENOSCOPY);  Surgeon: Abran Norleen SAILOR, MD;  Location: [facility];  Service: Gastroenterology;  Laterality: N/A;   EYE MUSCLE SURGERY Right 03/28/2014   PRESSURE SENSOR/CARDIOMEMS N/A 12/06/2021   Procedure: PRESSURE SENSOR/CARDIOMEMS;  Surgeon: [name] Toribio SAUNDERS, MD;  Location: [facility];  Service: Cardiovascular;  Laterality: N/A;   RIGHT HEART CATH N/A 08/05/2021   Procedure: RIGHT HEART CATH;  Surgeon: [name] Toribio SAUNDERS, MD;  Location: [facility];  Service: Cardiovascular;  Laterality: N/A;   RIGHT HEART CATH N/A 12/06/2021   Procedure: RIGHT HEART CATH;  Surgeon: [name] Toribio SAUNDERS, MD;  Location: [facility];  Service: Cardiovascular;  Laterality: N/A;   RIGHT HEART CATH N/A 12/13/2023   Procedure: RIGHT HEART CATH;  Surgeon: Zenaida Morene PARAS, MD;  Location[facility];  Service: Cardiovascular;  Laterality: N/A;   RIGHT/LEFT HEART CATH AND CORONARY ANGIOGRAPHY N/A 12/03/2020   Procedure: RIGHT/LEFT HEART CATH AND CORONARY ANGIOGRAPHY;  Surgeon: Bergamo Gordy, MD;  Location: [facility];  Service: Cardiovascular;  Laterality: N/A;   VENTRICULOSTOMY  03/28/1997       Scheduled Meds:  allopurinol   100 mg Oral Daily   aspirin  EC  81 mg Oral Daily   atorvastatin   80 mg Oral QPM   doxazosin   2 mg Oral Daily   DULoxetine   60 mg Oral QHS  heparin   5,000 Units Subcutaneous Q8H   hydrALAZINE   100 mg Oral Q8H   insulin  aspart  0-15 Units Subcutaneous TID WC   insulin  aspart  0-5 Units Subcutaneous QHS   insulin  aspart  5 Units Subcutaneous TID WC   insulin  regular human CONCENTRATED  200 Units Subcutaneous BID WC   isosorbide  mononitrate  60 mg Oral Daily   ivabradine   7.5 mg Oral BID WC   levothyroxine   150 mcg Oral QAC breakfast   pregabalin   300 mg Oral QHS   sodium chloride  flush  3 mL  Intravenous Q12H   Continuous Infusions:  furosemide  120 mg (02/24/24 0943)   PRN Meds: acetaminophen , sodium chloride  flush  Allergies:    Allergies  Allergen Reactions   Icosapent  Ethyl (Epa Ethyl Ester) (Fish) Hives   Ketamine Other (See Comments)    In a vegetative state for 15 minutes, per pt   Maitake Itching and Other (See Comments)    Itchy throat   Morphine  Hives, Itching, Rash and Other (See Comments)    Sore throat, too   Mushroom Other (See Comments)    Pt has never had mushrooms but tested positive on allergy test   Penicillins Hives, Itching and Rash    Tolerates cephalosporins   Shellfish Allergy Other (See Comments)    Pt has never had shellfish, but tested positive on allergy test. Pt states contrast in CT is okay.   Fentanyl  Nausea And Vomiting   Prednisone Rash    Social History:   Social History   Socioeconomic History   Marital status: Divorced    Spouse name: Not on file   Number of children: 0   Years of education: Not on file   Highest education level: Not on file  Occupational History   Occupation: Dance Movement Psychotherapist   Occupation: N/A  Tobacco Use   Smoking status: Never   Smokeless tobacco: Never  Vaping Use   Vaping status: Never Used  Substance and Sexual Activity   Alcohol  use: Never   Drug use: Never   Sexual activity: Yes  Other Topics Concern   Not on file  Social History Narrative   Not on file   Social Drivers of Health   Financial Resource Strain: Medium Risk (01/22/2024)   Received from [facility]   Overall Financial Resource Strain (CARDIA)    How hard is it for you to pay for the very basics like food, housing, medical care, and heating?: Somewhat hard  Food Insecurity: No Food Insecurity (02/23/2024)   Hunger Vital Sign    Worried About Running Out of Food in the Last Year: Never true    Ran Out of Food in the Last Year: Never true  Recent Concern: Food Insecurity - Food Insecurity Present (01/22/2024)   Received  from [facility]   Hunger Vital Sign    Within the past 12 months, you worried that your food would run out before you got the money to buy more.: Sometimes true    Within the past 12 months, the food you bought just didn't last and you didn't have money to get more.: Sometimes true  Transportation Needs: No Transportation Needs (02/23/2024)   PRAPARE - Administrator, Civil Service (Medical): No    Lack of Transportation (Non-Medical): No  Recent Concern: Transportation Needs - Unmet Transportation Needs (01/22/2024)   Received from [facility]   PRAPARE - Transportation    Lack of Transportation (Medical): Patient declined    In  the past 12 months, has lack of transportation kept you from meetings, work, or from getting things needed for daily living?: Yes  Physical Activity: Inactive (01/22/2024)   Received from [facility]   Exercise Vital Sign    On average, how many days per week do you engage in moderate to strenuous exercise (like a brisk walk)?: 0 days    Minutes of Exercise per Session: Not on file  Stress: No Stress Concern Present (01/22/2024)   Received from [facility] Occupational Health - Occupational Stress Questionnaire    Do you feel stress - tense, restless, nervous, or anxious, or unable to sleep at night because your mind is troubled all the time - these days?: Not at all  Social Connections: Socially Integrated (01/22/2024)   Received from [facility]   Social Network    How would you rate your social network (family, work, friends)?: Good participation with social networks  Intimate Partner Violence: Not At Risk (02/23/2024)   Humiliation, Afraid, Rape, and Kick questionnaire    Fear of Current or Ex-Partner: No    Emotionally Abused: No    Physically Abused: No    Sexually Abused: No    Family History:   Family History  Problem Relation Age of Onset   Diabetes Mother    Hypertension Mother    Hyperlipidemia  Mother    Thyroid  disease Mother    Hypertension Father    Diabetes Father    Pancreatic cancer Paternal Aunt    Pancreatic cancer Paternal Uncle    Colon cancer Neg Hx    Esophageal cancer Neg Hx    Stomach cancer Neg Hx      ROS:  Please see the history of present illness.   Physical Exam/Data: Vitals:   02/24/24 0500 02/24/24 0742 02/24/24 0904 02/24/24 1100  BP:  128/72 128/72 128/66  Pulse:  (!) 101  96  Resp:  20    Temp:  98.7 F (37.1 C)  98.1 F (36.7 C)  TempSrc:  Oral  Oral  SpO2:  94%  95%  Weight: 68.5 kg     Height:        Intake/Output Summary (Last 24 hours) at 02/24/2024 1148 Last data filed at 02/24/2024 0553 Gross per 24 hour  Intake 350 ml  Output --  Net 350 ml      02/24/2024    5:00 AM 02/23/2024    3:57 AM 02/23/2024    1:21 AM  Last 3 Weights  Weight (lbs) 151 lb 155 lb 12.8 oz 155 lb 12.8 oz  Weight (kg) 68.493 kg 70.67 kg 70.67 kg     Body mass index is 32.68 kg/m.  General: 33 y.o. [place] female resting comfortably in no acute distress. HEENT: Normocephalic and atraumatic. Sclera clear. EOMs intact. Neck: Supple. JVD not significantly elevated. Heart: RRR. No significant murmurs, gallops, or rubs.  Lungs: No increased work of breathing. Clear to ausculation bilaterally. No wheezes, rhonchi, or rales.  Abdomen: Distended abdomen but soft and non-tender. Extremities: Trace edema of right lower extremity edema (right leg chronically > left).   Skin: Warm and dry. Neuro: No focal deficits. Psych: Normal affect. Responds appropriately.   Telemetry: NSR  Relevant CV Studies:  Right Cardiac Catheterization 12/13/2023: Impressions: Right heart catheterization for evaluation of volume status and calibration of cardiomems Normal left and right heart filling pressures Significant respiratory variation, but cardiomems correlates well with RHC numbers Reduced cardiac output by assumed Fick, though  PA saturation similar to previous  cath in 2023 Elevated SVR of 2857dynes/sec*cm-5   Recommendations: Increase afterload reduction, hold diuretics today, start back orals tomorrow When evaluating cardiomems, would look at the PA tracings and use the end expiratory values when looking at [facility] _______________  Echocardiogram 02/15/2024: Impressions: 1. Left ventricular ejection fraction, by estimation, is 50 to 55%. The  left ventricle has low normal function. The left ventricle has no regional  wall motion abnormalities. There is mild concentric left ventricular  hypertrophy. Left ventricular  diastolic parameters are consistent with Grade I diastolic dysfunction  (impaired relaxation).   2. Right ventricular systolic function is normal. The right ventricular  size is normal. There is mildly elevated pulmonary artery systolic  pressure. The estimated right ventricular systolic pressure is 43.8 mmHg.   3. The mitral valve is normal in structure. Trivial mitral valve  regurgitation. No evidence of mitral stenosis.   4. The aortic valve is grossly normal. Aortic valve regurgitation is  mild. No aortic stenosis is present.   5. The inferior vena cava is dilated in size with >50% respiratory  variability, suggesting right atrial pressure of 8 mmHg.    Laboratory Data: High Sensitivity Troponin:  No results for input(s): TROPONINIHS in the last 720 hours.   Chemistry Recent Labs  Lab 02/22/24 2050 02/22/24 2230 02/23/24 0135 02/24/24 0158  NA 133* 133* 136 137  K 6.6* 5.9* 4.7 3.8  CL 94*  --  94* 92*  CO2 23  --  27 28  GLUCOSE 459*  --  350* 120*  BUN 55*  --  56* 65*  CREATININE 2.93*  --  3.11* 3.24*  CALCIUM  9.5  --  9.0 9.1  GFRNONAA 21*  --  20* 19*  ANIONGAP 15  --  15 17*    Recent Labs  Lab 02/22/24 2050  PROT 7.7  ALBUMIN  4.6  AST 121*  ALT 203*  ALKPHOS 108  BILITOT 0.5   Lipids  Recent Labs  Lab 02/24/24 0158  CHOL 326*  TRIG 989*  HDL 52  LDLCALC UNABLE TO CALCULATE IF  TRIGLYCERIDE OVER 400 mg/dL  CHOLHDL 6.3    Hematology Recent Labs  Lab 02/22/24 2050 02/22/24 2230 02/23/24 0133  WBC 6.1  --  7.1  RBC 3.13*  --  3.17*  HGB 9.2* 12.2 9.3*  HCT 29.1* 36.0 29.2*  MCV 93.0  --  92.1  MCH 29.4  --  29.3  MCHC 31.6  --  31.8  RDW 15.2  --  15.0  PLT 134*  --  133*   Thyroid  No results for input(s): TSH, FREET4 in the last 168 hours.  BNP Recent Labs  Lab 02/22/24 2050  PROBNP 685.0*    DDimer No results for input(s): DDIMER in the last 168 hours.  Radiology/Studies:  DG Chest Port 1 View Result Date: 02/22/2024 EXAM: 1 VIEW(S) XRAY OF THE CHEST 02/22/2024 07:36:00 PM COMPARISON: 12/28/2023 CLINICAL HISTORY: sob FINDINGS: LUNGS AND PLEURA: Interstitial prominence in the lungs could reflect interstitial edema. No pleural effusion. No pneumothorax. HEART AND MEDIASTINUM: Cardiomegaly. BONES AND SOFT TISSUES: No acute osseous abnormality. IMPRESSION: 1. Interstitial prominence in the lungs, possibly reflecting interstitial edema. 2. Cardiomegaly. Electronically signed by: Franky Crease MD 02/22/2024 07:43 PM EST RP Workstation: [id number]     Assessment and Plan:  Acute on Chronic HFrEF Non-Ischemic Cardiomyopathy Patient has a long history of non-ischemic cardiomyopathy with EF as low as 25-30% in 2022 but has subsequently normalized. She has had  multiple hospitalizations over the years for acute on chronic CHF and had cardiomems placed in 2023. She was admitted in 11/2023 and again in 12/2023 for this. RHC during admission in 11/2023 showed reduced cardiac output by assumed Fick (though PA sat was similar to prior cath in 2023), severely elevated of SVR, and normal left/ right filling pressures. Cardiomems correlated well with RHC numbers.  Recent Echo on 02/05/2024 showed LVEF of 50-55% with normal wall motion, mild LVH, and grade 1 diastolic dysfunction as well as normal RV function, mild AI, and mildly elevated PASP. BNP elevated at 685.   Chest x-ray showed interstitial prominence in the lung possibly reflecting interstitial edema. She was started on IV Lasix .  - Volume overloaded. Weight 145 lbs on recent discharge in 12/2023 ; wt 152lbs 02/07/24 in AHF clinic. Admit wt 155.8lbs; wt today 151lbs. - GDMT limited by renal function.  - SCr continues to climb with diuresis (2.93>>3.11>>3.24). - Lasix  was increased to 120mg  twice daily (this is the dose she was started on during last admission) but I&Os unaccurate and renal function continues to decline - Not on ACEi/ ARB/ ARNI or MRA due to renal function and recurrent hyperkalemia. - Not on SGLT2 inhibitor given renal function and uncontrolled diabetes. - Not on beta-blocker given low cardiac output on recent RHC. - continue Imdur  60mg  daily, Hydralazine  100mg  TID and Ivabradine  7.5mg  BID. - Continue daily weights, strict I/Os, and renal function. - her weight is at baseline from a few weeks ago in AHF clinic and renal function now trending upward -recommend transitioning Lasix  back to Torsemide  but increase to 80mg  daily (was on 60mg  daily PTA) with an extra 20mg  PRN  - If renal function continues to decline in setting of increased diuretic doses and continued volume overload (wt 6lbs above dry weight) may need to have HF service see   Pulmonary Hypertension Mild to moderate pulmonary hypertension on RHC in 07/2021. Prior VQ scan negative. Recent RHC in 11/2023 showed reduced cardiac output by assumed Fick (though PA sat was similar to prior cath in 2023), severely elevated of SVR, and normal left/ right filling pressures.   - Previously did not tolerate Sildenafil  in the past due to dizziness and low BP. - Continue diuresis as above. - Of note, she also has obstructive sleep apnea but is non-compliant with her PCP.  Elevated Troponin CAD LHC in 2022 showed single vessel RCA occlusion (small co-dominant vessel). Medical therapy was recommended. High-sensitivity troponin minimally  elevated but flat in the 70s and c/w demand ischemia in setting of acute CHF - denies any anginal sx - Continue Aspirin  81mg  daily and Lipitor  80mg  daily. - no further ischemic workup indicated at this time. - continue ASA 81mg  daily, Atorvastatin  80mg  daily, Imdur  60mg  daily  Hypertension BP initially markedly elevated with systolic BP of 189.  BP stable this am at 128/23mmHg - continue Doxazosin  2mg  daily, Hydralazine  100mg  TID, Imdur  60mg  daily  Hyperlipidemia Severe Hypertriglyceridemia Lipid panel in 11/2023: Total Cholesterol 798 and Triglycerides >4,425. Unable to calculate HDL or LDL given markedly elevated triglycerides. Direct LDL 48.  - has LPL deficiency with recent pancreatitis - continue Atorvastatin  80mg  daily - Previously on Fenofibrate  but this was stopped on admission (presuming due to eGFR <30).  - Previously seen by Dr. Mona. Recommend she follow back up with after discharge.  AKI on CKD Stage IV Renal biopsy in 12/2021 showed DM and HTN nephropathy.  - Baseline creatinine around 3.0. Creatinine 2.93 on admission. -  Creatinine continues to trend upward to 3.24 today - would transition back to Torsemide  but increase from 60mg  to 80mg  daily - follow renal function and if it continues to deteriorate would recommend renal consult  Hyperkalemia Potassium 6.6 on admission. Treated with Sodium Bicarb, Insulin , and Lokelma . - Improved to 3.8 today. - Continue to monitor closely.   Type 3c DM -secondary to chronic pancreatitis -followed at [facility] -on Insulin   Chronic abdominal swelling/bloating -seen at [facility] for IBS -had pneumatosis intestinalis -also has DM gastroparesis -started on pancreatic enzyme replacement -fatty liver  Otherwise, per primary team: - Uncontrolled diabetes mellitus - Hypothyroidism  - Chronically elevated LFTs - Hyponatremia - Obstructive sleep apnea  I spent 40 minutes caring for this patient today face to face,  ordering and reviewing labs, reviewing records from 2D echo 11/10/202, RHC 12/13/2023, seeing the patient, documenting in the record  For questions or updates, please contact [facility] Please consult [email or web address] for contact info under   Signed, Wilbert Bihari, MD  02/24/2024 11:48 AM

## 2024-02-25 DIAGNOSIS — I272 Pulmonary hypertension, unspecified: Secondary | ICD-10-CM | POA: Diagnosis not present

## 2024-02-25 DIAGNOSIS — I2510 Atherosclerotic heart disease of native coronary artery without angina pectoris: Secondary | ICD-10-CM | POA: Diagnosis not present

## 2024-02-25 DIAGNOSIS — E8770 Fluid overload, unspecified: Secondary | ICD-10-CM | POA: Diagnosis not present

## 2024-02-25 DIAGNOSIS — I5023 Acute on chronic systolic (congestive) heart failure: Secondary | ICD-10-CM | POA: Diagnosis not present

## 2024-02-25 DIAGNOSIS — I428 Other cardiomyopathies: Secondary | ICD-10-CM | POA: Diagnosis not present

## 2024-02-25 LAB — BASIC METABOLIC PANEL WITH GFR
Anion gap: 18 — ABNORMAL HIGH (ref 5–15)
BUN: 77 mg/dL — ABNORMAL HIGH (ref 6–20)
CO2: 26 mmol/L (ref 22–32)
Calcium: 9.4 mg/dL (ref 8.9–10.3)
Chloride: 88 mmol/L — ABNORMAL LOW (ref 98–111)
Creatinine, Ser: 3.48 mg/dL — ABNORMAL HIGH (ref 0.44–1.00)
GFR, Estimated: 17 mL/min — ABNORMAL LOW (ref >60)
Glucose, Bld: 97 mg/dL (ref 70–99)
Potassium: 3.9 mmol/L (ref 3.5–5.1)
Sodium: 132 mmol/L — ABNORMAL LOW (ref 135–145)

## 2024-02-25 LAB — GLUCOSE, CAPILLARY
Glucose-Capillary: 78 mg/dL (ref 70–99)
Glucose-Capillary: 214 mg/dL — ABNORMAL HIGH (ref 70–99)

## 2024-02-25 MED ORDER — ATORVASTATIN CALCIUM 80 MG PO TABS: 80.0000 mg | ORAL_TABLET | Freq: Every evening | ORAL | 0 refills | Status: DC

## 2024-02-25 MED ORDER — TORSEMIDE 20 MG PO TABS: 60.0000 mg | ORAL_TABLET | Freq: Every day | ORAL | 2 refills | Status: DC

## 2024-02-25 NOTE — Discharge Summary (Signed)
 Physician Discharge Summary   Patient: Jennifer [name] MRN: [id number] DOB: 10/29/1990  Admit date:     02/22/2024  Discharge date: 02/25/24  Discharge Physician: Owen DELENA Lore   PCP: Emilio Joesph DEL, PA-C   Recommendations at discharge:   Needs Bmet to follow renal function.  Further diuretics adjustment by HF team  Discharge Diagnoses: Principal Problem:   Acute CHF (congestive heart failure) (HCC) Active Problems:   Acute on chronic diastolic CHF (congestive heart failure) (HCC)   Elevated troponin   Hyperglycemia in setting of T2DM   Primary hypertension   Acute renal failure superimposed on stage 3b chronic kidney disease (HCC)   CKD stage 3b, GFR 30-44 ml/min (HCC)   Coronary artery disease due to lipid rich plaque   History of astrocytoma of brain   Type 2 diabetes mellitus with hyperlipidemia (HCC)   Hypothyroidism   Sleep apnea   Hyperkalemia   NASH (nonalcoholic steatohepatitis)   Hyponatremia   Polycystic ovary syndrome   Hypertriglyceridemia   Acute on chronic heart failure with normal ejection fraction (HCC)   CKD (chronic kidney disease) stage 4, GFR 15-29 ml/min (HCC)   Hyperglycemia due to diabetes mellitus (HCC)   Pulmonary HTN (HCC)   NICM (nonischemic cardiomyopathy) (HCC)   Pure hypercholesterolemia  Resolved Problems:   * No resolved hospital problems. *  Hospital Course: 33 year old with past medical history significant for astrocytoma s/p resection 1997 with history of chronic heart failure reduced ejection fraction, severe hypertriglyceridemia due to LPL deficiency, chronic pancreatitis presented with worsening dyspnea, 10 to 15 pounds weight gain recently.  She has been taking torsemide  without significant improvement. Evaluation in the ED labs consistent with hyperkalemia, mild elevated troponin transaminases hyperglycemia.    Assessment and Plan: 1-Acute on chronic diastolic heart failure - Presented with dyspnea, 15 pounds  weight gain, proBNP 685.  Chest x-ray with interstitial edema - Treated with  120 mg IV  BID for 24 hours - Daily weight and strict I's and O's - Cardiology consulted - On ivabradine  Weight down to 151 from 155 Urine out put not documented.  Follow cardiology recommendations.  Transition to torsemide , 80 mg daily. Now on hold due to increase Cr. Plan to hold torsemide  for two days then resume previous dose of 60 mg  Per cardiology, ok to discharge Patient    Chronic kidney failure stage IIIb - Previous creatinine 1.6--- 3.4 - Continue to monitor renal function on diuretics Monitor.    Insulin -dependent diabetes hyperglycemia uncontrolled - Continue 200 units Humulin  R - Continue scale insulin  - Continue with  meal coverage   Hypothyroidism;  Continue with Synthroid .    Elevated troponin -flat in setting CKD and HF>      Essential hypertension - Continue  hydralazine  and Lasix , Cardura    Hyponatremia - Monitor on diuretics     Chronic transaminases - Hepatic congestion in the setting of heart failure    History of astrocytoma of the brain in remission previously treated, as child.  - Follow-up as an outpatient   Obstructive sleep apnea - CPAP   Hyperkalemia: Received sodium bicarb and insulin  in the ED.    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 Now receiving Tryngolza ETTER Rubens) monthly  Triglyceride: 989 down from 4,000                Consultants: Cardiology  Procedures performed: none Disposition: Home Diet recommendation:  Discharge Diet Orders (From admission, onward)     Start  Ordered   02/25/24 0000  Diet - low sodium heart healthy        02/25/24 1303           Cardiac diet DISCHARGE MEDICATION: Allergies as of 02/25/2024       Reactions   Icosapent  Ethyl (epa Ethyl Ester) (fish) Hives   Ketamine Other (See Comments)   In a vegetative state for 15 minutes, per pt   Maitake Itching, Other (See  Comments)   Itchy throat   Morphine  Hives, Itching, Rash, Other (See Comments)   Sore throat, too   Mushroom Other (See Comments)   Pt has never had mushrooms but tested positive on allergy test   Penicillins Hives, Itching, Rash   Tolerates cephalosporins   Shellfish Allergy Other (See Comments)   Pt has never had shellfish, but tested positive on allergy test. Pt states contrast in CT is okay.   Fentanyl  Nausea And Vomiting   Prednisone Rash        Medication List     STOP taking these medications    Atogepant 60 MG Tabs   fenofibrate  54 MG tablet       TAKE these medications    albuterol  108 (90 Base) MCG/ACT inhaler Commonly known as: VENTOLIN  HFA Inhale 2 puffs into the lungs daily as needed for wheezing or shortness of breath.   allopurinol  100 MG tablet Commonly known as: Zyloprim  Take 1 tablet (100 mg total) by mouth daily.   amitriptyline  10 MG tablet Commonly known as: ELAVIL  Take 10 mg by mouth. What changed: when to take this   aspirin  EC 81 MG tablet Take 1 tablet (81 mg total) by mouth daily. Swallow whole.   atorvastatin  80 MG tablet Commonly known as: LIPITOR  Take 1 tablet (80 mg total) by mouth every evening.   azelastine  0.1 % nasal spray Commonly known as: ASTELIN  Place 2 sprays into both nostrils daily as needed for rhinitis or allergies.   Baqsimi One Pack 3 MG/DOSE Powd Generic drug: Glucagon Place 1 Dose into the nose once as needed (hypoglycemia).   BLOOD GLUCOSE TEST STRIPS Strp 1 each by Does not apply route 3 (three) times daily. Use as directed to check blood sugar. May dispense any manufacturer covered by patient's insurance and fits patient's device.   calcitRIOL  0.25 MCG capsule Commonly known as: ROCALTROL  Take 0.25 mcg by mouth daily.   dicyclomine  20 MG tablet Commonly known as: BENTYL  Take 1 tablet (20 mg total) by mouth 2 (two) times daily.   doxazosin  2 MG tablet Commonly known as: CARDURA  Take 1 tablet (2 mg  total) by mouth daily.   DULoxetine  60 MG capsule Commonly known as: CYMBALTA  Take 60 mg by mouth at bedtime.   ergocalciferol  1.25 MG (50000 UT) capsule Commonly known as: VITAMIN D2 Take 1 capsule (50,000 Units total) by mouth every Sunday.   HumaLOG  KwikPen 100 UNIT/ML KwikPen Generic drug: insulin  lispro Inject 60 Units into the skin in the morning, at noon, and at bedtime.   HumuLIN  R U-500 KwikPen 500 UNIT/ML KwikPen Generic drug: insulin  regular human CONCENTRATED Inject 300 Units into the skin in the morning and at bedtime.   hydrALAZINE  100 MG tablet Commonly known as: APRESOLINE  Take 1 tablet (100 mg total) by mouth every 8 (eight) hours.   isosorbide  mononitrate 60 MG 24 hr tablet Commonly known as: IMDUR  Take 1 tablet (60 mg total) by mouth daily.   ivabradine  7.5 MG Tabs tablet Commonly known as: CORLANOR  Take 1 tablet (  7.5 mg total) by mouth 2 (two) times daily with a meal.   Lancet Device Misc 1 each by Does not apply route 3 (three) times daily. May dispense any manufacturer covered by patient's insurance.   Lancets Misc 1 each by Does not apply route 3 (three) times daily. Use as directed to check blood sugar. May dispense any manufacturer covered by patient's insurance and fits patient's device.   lansoprazole  30 MG capsule Commonly known as: PREVACID  TAKE 1 CAPSULE (30 MG TOTAL) BY MOUTH 2 (TWO) TIMES DAILY BEFORE A MEAL.   levocetirizine 5 MG tablet Commonly known as: XYZAL  Take 5 mg by mouth at bedtime.   levothyroxine  150 MCG tablet Commonly known as: SYNTHROID  Take 150 mcg by mouth daily before breakfast.   methylPREDNISolone  4 MG Tbpk tablet Commonly known as: MEDROL  DOSEPAK Take as directed   Pen Needles 31G X 5 MM Misc Use as directed 3 (three) times daily.   pregabalin  300 MG capsule Commonly known as: LYRICA  Take 300 mg by mouth at bedtime.   promethazine  25 MG tablet Commonly known as: PHENERGAN  Take 25 mg by mouth at  bedtime.   Safety Seal Miscellaneous Misc Hormonic Hair Solution with minoxidil USP 7% and finasteride USP 0.05% - apply to affected areas daily every morning.   SUMAtriptan  100 MG tablet Commonly known as: IMITREX  Take 100 mg by mouth daily as needed.   torsemide  20 MG tablet Commonly known as: DEMADEX  Take 3 tablets (60 mg total) by mouth daily. Start taking on: February 28, 2024 What changed: These instructions start on February 28, 2024. If you are unsure what to do until then, ask your doctor or other care provider.   tretinoin  0.05 % cream Commonly known as: RETIN-A  Apply 1 Application topically at bedtime.        Follow-up Information     Emilio Joesph DEL, PA-C Follow up in 1 week(s).   Specialty: Physician Assistant Contact information: [address] [place] [address] [phone]                Discharge Exam: Fredricka Weights   02/23/24 0357 02/24/24 0500 02/25/24 0440  Weight: 70.7 kg 68.5 kg 69.3 kg   General; NAD  Condition at discharge: stable  The results of significant diagnostics from this hospitalization (including imaging, microbiology, ancillary and laboratory) are listed below for reference.   Imaging Studies: DG Chest Port 1 View Result Date: 02/22/2024 EXAM: 1 VIEW(S) XRAY OF THE CHEST 02/22/2024 07:36:00 PM COMPARISON: 12/28/2023 CLINICAL HISTORY: sob FINDINGS: LUNGS AND PLEURA: Interstitial prominence in the lungs could reflect interstitial edema. No pleural effusion. No pneumothorax. HEART AND MEDIASTINUM: Cardiomegaly. BONES AND SOFT TISSUES: No acute osseous abnormality. IMPRESSION: 1. Interstitial prominence in the lungs, possibly reflecting interstitial edema. 2. Cardiomegaly. Electronically signed by: Franky Crease MD 02/22/2024 07:43 PM EST RP Workstation: [id number]   ECHOCARDIOGRAM COMPLETE Result Date: 02/05/2024    ECHOCARDIOGRAM REPORT   Patient Name:   EDUARDA SCRIVENS [facility] Date of Exam: 02/05/2024 Medical  Rec #:  [id number]                Height:       57.0 in Accession #:    [id number]               Weight:       151.0 lb Date of Birth:  1991/01/08                BSA:  1.596 m Patient Age:    33 years                 BP:           169/69 mmHg Patient Gender: F                        HR:           100 bpm. Exam Location:  Outpatient Procedure: 2D Echo, Color Doppler and Cardiac Doppler (Both Spectral and Color            Flow Doppler were utilized during procedure). Indications:    Congestive Heart Failure I50.9  History:        Patient has prior history of Echocardiogram examinations, most                 recent 12/12/2023. CHF, CAD, CKD; Risk Factors:Diabetes,                 Hypertension and Dyslipidemia.  Sonographer:    Koleen Popper RDCS Referring Phys: [phone] JESSICA M MILFORD  Sonographer Comments: Image acquisition challenging due to respiratory motion. IMPRESSIONS  1. Left ventricular ejection fraction, by estimation, is 50 to 55%. The left ventricle has low normal function. The left ventricle has no regional wall motion abnormalities. There is mild concentric left ventricular hypertrophy. Left ventricular diastolic parameters are consistent with Grade I diastolic dysfunction (impaired relaxation).  2. Right ventricular systolic function is normal. The right ventricular size is normal. There is mildly elevated pulmonary artery systolic pressure. The estimated right ventricular systolic pressure is 43.8 mmHg.  3. The mitral valve is normal in structure. Trivial mitral valve regurgitation. No evidence of mitral stenosis.  4. The aortic valve is grossly normal. Aortic valve regurgitation is mild. No aortic stenosis is present.  5. The inferior vena cava is dilated in size with >50% respiratory variability, suggesting right atrial pressure of 8 mmHg. Comparison(s): Prior images reviewed side by side. Changes from prior study are noted. EF slightly improved compared to prior. FINDINGS  Left Ventricle: Left  ventricular ejection fraction, by estimation, is 50 to 55%. The left ventricle has low normal function. The left ventricle has no regional wall motion abnormalities. The left ventricular internal cavity size was normal in size. There is mild concentric left ventricular hypertrophy. Left ventricular diastolic parameters are consistent with Grade I diastolic dysfunction (impaired relaxation). Right Ventricle: The right ventricular size is normal. No increase in right ventricular wall thickness. Right ventricular systolic function is normal. There is mildly elevated pulmonary artery systolic pressure. The tricuspid regurgitant velocity is 2.99  m/s, and with an assumed right atrial pressure of 8 mmHg, the estimated right ventricular systolic pressure is 43.8 mmHg. Left Atrium: Left atrial size was normal in size. Right Atrium: Right atrial size was normal in size. Pericardium: There is no evidence of pericardial effusion. Mitral Valve: The mitral valve is normal in structure. Trivial mitral valve regurgitation. No evidence of mitral valve stenosis. Tricuspid Valve: The tricuspid valve is normal in structure. Tricuspid valve regurgitation is trivial. No evidence of tricuspid stenosis. Aortic Valve: The aortic valve is grossly normal. Aortic valve regurgitation is mild. No aortic stenosis is present. Pulmonic Valve: The pulmonic valve was grossly normal. Pulmonic valve regurgitation is mild to moderate. No evidence of pulmonic stenosis. Aorta: The aortic root, ascending aorta, aortic arch and descending aorta are all structurally normal, with no evidence of dilitation or obstruction. Venous: The  inferior vena cava is dilated in size with greater than 50% respiratory variability, suggesting right atrial pressure of 8 mmHg. IAS/Shunts: The atrial septum is grossly normal.  LEFT VENTRICLE PLAX 2D LVIDd:         5.10 cm      Diastology LVIDs:         3.20 cm      LV e' medial:    5.77 cm/s LV PW:         1.10 cm      LV E/e'  medial:  14.6 LV IVS:        1.20 cm      LV e' lateral:   11.10 cm/s LVOT diam:     1.70 cm      LV E/e' lateral: 7.6 LV SV:         41 LV SV Index:   26 LVOT Area:     2.27 cm  LV Volumes (MOD) LV vol d, MOD A2C: 142.0 ml LV vol d, MOD A4C: 108.0 ml LV vol s, MOD A2C: 66.6 ml LV vol s, MOD A4C: 41.7 ml LV SV MOD A2C:     75.4 ml LV SV MOD A4C:     108.0 ml LV SV MOD BP:      69.7 ml RIGHT VENTRICLE             IVC RV Basal diam:  3.10 cm     IVC diam: 2.40 cm RV S prime:     16.60 cm/s TAPSE (M-mode): 1.7 cm LEFT ATRIUM             Index        RIGHT ATRIUM          Index LA diam:        3.30 cm 2.07 cm/m   RA Area:     9.63 cm LA Vol (A2C):   20.1 ml 12.59 ml/m  RA Volume:   16.20 ml 10.15 ml/m LA Vol (A4C):   19.7 ml 12.34 ml/m LA Biplane Vol: 21.0 ml 13.16 ml/m  AORTIC VALVE LVOT Vmax:   106.00 cm/s LVOT Vmean:  70.700 cm/s LVOT VTI:    0.180 m  AORTA Ao Root diam: 2.60 cm Ao Asc diam:  3.00 cm MITRAL VALVE               TRICUSPID VALVE MV Area (PHT): 5.66 cm    TR Peak grad:   35.8 mmHg MV Decel Time: 134 msec    TR Vmax:        299.00 cm/s MR Peak grad: 59.6 mmHg MR Vmax:      386.00 cm/s  SHUNTS MV E velocity: 84.40 cm/s  Systemic VTI:  0.18 m MV A velocity: 64.50 cm/s  Systemic Diam: 1.70 cm MV E/A ratio:  1.31 Shelda Bruckner MD Electronically signed by Shelda Bruckner MD Signature Date/Time: 02/05/2024/7:21:41 PM    Final     Microbiology: Results for orders placed or performed during the hospital encounter of 02/22/24  Resp panel by RT-PCR (RSV, Flu A&B, Covid) Anterior Nasal Swab     Status: None   Collection Time: 02/22/24  9:00 PM   Specimen: Anterior Nasal Swab  Result Value Ref Range Status   SARS Coronavirus 2 by RT PCR NEGATIVE NEGATIVE Final    Comment: (NOTE) SARS-CoV-2 target nucleic acids are NOT DETECTED.  The SARS-CoV-2 RNA is generally detectable in upper respiratory specimens during the acute phase of infection. The lowest concentration  of SARS-CoV-2  viral copies this assay can detect is 138 copies/mL. A negative result does not preclude SARS-Cov-2 infection and should not be used as the sole basis for treatment or other patient management decisions. A negative result may occur with  improper specimen collection/handling, submission of specimen other than nasopharyngeal swab, presence of viral mutation(s) within the areas targeted by this assay, and inadequate number of viral copies(<138 copies/mL). A negative result must be combined with clinical observations, patient history, and epidemiological information. The expected result is Negative.  Fact Sheet for Patients:  bloggercourse.com  Fact Sheet for Healthcare Providers:  seriousbroker.it  This test is no t yet approved or cleared by the [place]  FDA and  has been authorized for detection and/or diagnosis of SARS-CoV-2 by FDA under an Emergency Use Authorization (EUA). This EUA will remain  in effect (meaning this test can be used) for the duration of the COVID-19 declaration under Section 564(b)(1) of the Act, 21 U.S.C.section 360bbb-3(b)(1), unless the authorization is terminated  or revoked sooner.       Influenza A by PCR NEGATIVE NEGATIVE Final   Influenza B by PCR NEGATIVE NEGATIVE Final    Comment: (NOTE) The Xpert Xpress SARS-CoV-2/FLU/RSV plus assay is intended as an aid in the diagnosis of influenza from Nasopharyngeal swab specimens and should not be used as a sole basis for treatment. Nasal washings and aspirates are unacceptable for Xpert Xpress SARS-CoV-2/FLU/RSV testing.  Fact Sheet for Patients: bloggercourse.com  Fact Sheet for Healthcare Providers: seriousbroker.it  This test is not yet approved or cleared by the [place]  FDA and has been authorized for detection and/or diagnosis of SARS-CoV-2 by FDA under an Emergency Use Authorization  (EUA). This EUA will remain in effect (meaning this test can be used) for the duration of the COVID-19 declaration under Section 564(b)(1) of the Act, 21 U.S.C. section 360bbb-3(b)(1), unless the authorization is terminated or revoked.     Resp Syncytial Virus by PCR NEGATIVE NEGATIVE Final    Comment: (NOTE) Fact Sheet for Patients: bloggercourse.com  Fact Sheet for Healthcare Providers: seriousbroker.it  This test is not yet approved or cleared by the [place]  FDA and has been authorized for detection and/or diagnosis of SARS-CoV-2 by FDA under an Emergency Use Authorization (EUA). This EUA will remain in effect (meaning this test can be used) for the duration of the COVID-19 declaration under Section 564(b)(1) of the Act, 21 U.S.C. section 360bbb-3(b)(1), unless the authorization is terminated or revoked.  Performed at [facility], [address], [place], [place] [address]     Labs: CBC: Recent Labs  Lab 02/22/24 2050 02/22/24 2230 02/23/24 0133  WBC 6.1  --  7.1  NEUTROABS 3.8  --   --   HGB 9.2* 12.2 9.3*  HCT 29.1* 36.0 29.2*  MCV 93.0  --  92.1  PLT 134*  --  133*   Basic Metabolic Panel: Recent Labs  Lab 02/22/24 2050 02/22/24 2230 02/23/24 0135 02/24/24 0158 02/25/24 0140  NA 133* 133* 136 137 132*  K 6.6* 5.9* 4.7 3.8 3.9  CL 94*  --  94* 92* 88*  CO2 23  --  27 28 26   GLUCOSE 459*  --  350* 120* 97  BUN 55*  --  56* 65* 77*  CREATININE 2.93*  --  3.11* 3.24* 3.48*  CALCIUM  9.5  --  9.0 9.1 9.4   Liver Function Tests: Recent Labs  Lab 02/22/24 2050  AST 121*  ALT 203*  ALKPHOS  108  BILITOT 0.5  PROT 7.7  ALBUMIN  4.6   CBG: Recent Labs  Lab 02/24/24 1123 02/24/24 1613 02/24/24 2045 02/25/24 0603 02/25/24 1132  GLUCAP 214* 145* 145* 78 214*    Discharge time spent: greater than 30 minutes.  Signed: Owen DELENA Lore, MD [facility] 02/25/2024

## 2024-02-25 NOTE — Progress Notes (Signed)
 PROGRESS NOTE    Jennifer [name]  FMW:[id number] DOB: 28-Jun-1990 DOA: 02/22/2024 PCP: [name]   Brief Narrative: 33 year old with past medical history significant for astrocytoma s/p resection 1997 with history of chronic heart failure reduced ejection fraction, severe hypertriglyceridemia due to LPL deficiency, chronic pancreatitis presented with worsening dyspnea, 10 to 15 pounds weight gain recently.  She has been taking torsemide  without significant improvement. Evaluation in the ED labs consistent with hyperkalemia, mild elevated troponin transaminases hyperglycemia.    Assessment & Plan:   Principal Problem:   Acute CHF (congestive heart failure) (HCC) Active Problems:   Acute on chronic diastolic CHF (congestive heart failure) (HCC)   Elevated troponin   Hyperglycemia in setting of T2DM   Primary hypertension   Acute renal failure superimposed on stage 3b chronic kidney disease (HCC)   CKD stage 3b, GFR 30-44 ml/min (HCC)   Coronary artery disease due to lipid rich plaque   History of astrocytoma of brain   Type 2 diabetes mellitus with hyperlipidemia (HCC)   Hypothyroidism   Sleep apnea   Hyperkalemia   NASH (nonalcoholic steatohepatitis)   Hyponatremia   Polycystic ovary syndrome   Hypertriglyceridemia   Acute on chronic heart failure with normal ejection fraction (HCC)   CKD (chronic kidney disease) stage 4, GFR 15-29 ml/min (HCC)   Hyperglycemia due to diabetes mellitus (HCC)   Pulmonary HTN (HCC)   NICM (nonischemic cardiomyopathy) (HCC)   Pure hypercholesterolemia   1-Acute on chronic diastolic heart failure - Presented with dyspnea, 15 pounds weight gain, proBNP 685.  Chest x-ray with interstitial edema - Treated with  120 mg IV  BID for 24 hours - Daily weight and strict I's and O's - Cardiology consulted - On ivabradine  Weight down to 151 from 155 Urine out put not documented.  Follow cardiology recommendations.  Transition to  torsemide , 80 mg daily. Now on hold due to increase Cr.   Chronic kidney failure stage IIIb - Previous creatinine 1.6--- 3.4 - Continue to monitor renal function on diuretics Monitor.   Insulin -dependent diabetes hyperglycemia uncontrolled - Continue 200 units Humulin  R - Continue scale insulin  - Continue with  meal coverage  Hypothyroidism;  Continue with Synthroid .   Elevated troponin -flat in setting CKD and HF>    Essential hypertension - Continue  hydralazine  and Lasix , Cardura   Hyponatremia - Monitor on diuretics   Chronic transaminases - Hepatic congestion in the setting of heart failure   History of astrocytoma of the brain in remission previously treated, as child.  - Follow-up as an outpatient  Obstructive sleep apnea - CPAP  Hyperkalemia: Received sodium bicarb and insulin  in the ED.   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   Weight as of this encounter: 69.3 kg.   DVT prophylaxis: Heparin  Code Status: Full code Family Communication: Care discussed with patient Disposition Plan:  Status is: Inpatient Remains inpatient appropriate because: Management of heart failure    Consultants:  Cardiology  Procedures:  None Antimicrobials:    Subjective: [name] alert, report breathing better.  She will start doing some exercise as she can.   Objective: Vitals:   02/25/24 0557 02/25/24 0734 02/25/24 1134 02/25/24 1135  BP: 130/84 (!) 141/80 (!) 129/56   Pulse:  87 88  Resp:  20 18   Temp:  98.7 F (37.1 C) 97.6 F (36.4 C) 97.6 F (36.4 C)  TempSrc:  Oral Oral   SpO2:  98% 94% 91%  Weight:      Height:        Intake/Output Summary (Last 24 hours) at 02/25/2024 1243 Last data filed at 02/25/2024  0617 Gross per 24 hour  Intake 480 ml  Output 1850 ml  Net -1370 ml   Filed Weights   02/23/24 0357 02/24/24 0500 02/25/24 0440  Weight: 70.7 kg 68.5 kg 69.3 kg    Examination:  General exam: NAD Respiratory system: CTA Cardiovascular system: S,1 S 2 RRR Gastrointestinal system: BS present, soft, nt Central nervous system: Alert Extremities: no edema    Data Reviewed: I have personally reviewed following labs and imaging studies  CBC: Recent Labs  Lab 02/22/24 2050 02/22/24 2230 02/23/24 0133  WBC 6.1  --  7.1  NEUTROABS 3.8  --   --   HGB 9.2* 12.2 9.3*  HCT 29.1* 36.0 29.2*  MCV 93.0  --  92.1  PLT 134*  --  133*   Basic Metabolic Panel: Recent Labs  Lab 02/22/24 2050 02/22/24 2230 02/23/24 0135 02/24/24 0158 02/25/24 0140  NA 133* 133* 136 137 132*  K 6.6* 5.9* 4.7 3.8 3.9  CL 94*  --  94* 92* 88*  CO2 23  --  27 28 26   GLUCOSE 459*  --  350* 120* 97  BUN 55*  --  56* 65* 77*  CREATININE 2.93*  --  3.11* 3.24* 3.48*  CALCIUM  9.5  --  9.0 9.1 9.4   GFR: Estimated Creatinine Clearance: 18.6 mL/min (A) (by C-G formula based on SCr of 3.48 mg/dL (H)). Liver Function Tests: Recent Labs  Lab 02/22/24 2050  AST 121*  ALT 203*  ALKPHOS 108  BILITOT 0.5  PROT 7.7  ALBUMIN  4.6   No results for input(s): LIPASE, AMYLASE in the last 168 hours. No results for input(s): AMMONIA in the last 168 hours. Coagulation Profile: No results for input(s): INR, PROTIME in the last 168 hours. Cardiac Enzymes: No results for input(s): CKTOTAL, CKMB, CKMBINDEX, TROPONINI in the last 168 hours. BNP (last 3 results) Recent Labs    12/07/23 0018 02/22/24 2050  PROBNP 433.0* 685.0*   HbA1C: No results for input(s): HGBA1C in the last 72 hours. CBG: Recent Labs  Lab 02/24/24 1123 02/24/24 1613 02/24/24 2045 02/25/24 0603 02/25/24 1132  GLUCAP 214* 145* 145* 78 214*   Lipid Profile: Recent Labs    02/24/24 0158  CHOL 326*  HDL 52   LDLCALC UNABLE TO CALCULATE IF TRIGLYCERIDE OVER 400 mg/dL  TRIG 010*  CHOLHDL 6.3   Thyroid  Function Tests: No results for input(s): TSH, T4TOTAL, FREET4, T3FREE, THYROIDAB in the last 72 hours. Anemia Panel: No results for input(s): VITAMINB12, FOLATE, FERRITIN, TIBC, IRON, RETICCTPCT in the last 72 hours. Sepsis Labs: No results for input(s): PROCALCITON, LATICACIDVEN in the last 168 hours.  Recent Results (from the past 240 hours)  Resp panel by RT-PCR (RSV, Flu A&B, Covid) Anterior Nasal Swab     Status: None   Collection Time: 02/22/24  9:00 PM   Specimen: Anterior Nasal Swab  Result Value Ref Range Status   SARS Coronavirus 2 by RT PCR NEGATIVE NEGATIVE Final    Comment: (NOTE) SARS-CoV-2 target nucleic acids are NOT DETECTED.  The SARS-CoV-2 RNA is generally detectable in upper respiratory specimens during the acute phase  of infection. The lowest concentration of SARS-CoV-2 viral copies this assay can detect is 138 copies/mL. A negative result does not preclude SARS-Cov-2 infection and should not be used as the sole basis for treatment or other patient management decisions. A negative result may occur with  improper specimen collection/handling, submission of specimen other than nasopharyngeal swab, presence of viral mutation(s) within the areas targeted by this assay, and inadequate number of viral copies(<138 copies/mL). A negative result must be combined with clinical observations, patient history, and epidemiological information. The expected result is Negative.  Fact Sheet for Patients:  bloggercourse.com  Fact Sheet for Healthcare Providers:  seriousbroker.it  This test is no t yet approved or cleared by the [place]  FDA and  has been authorized for detection and/or diagnosis of SARS-CoV-2 by FDA under an Emergency Use Authorization (EUA). This EUA will remain  in effect (meaning  this test can be used) for the duration of the COVID-19 declaration under Section 564(b)(1) of the Act, 21 U.S.C.section 360bbb-3(b)(1), unless the authorization is terminated  or revoked sooner.       Influenza A by PCR NEGATIVE NEGATIVE Final   Influenza B by PCR NEGATIVE NEGATIVE Final    Comment: (NOTE) The Xpert Xpress SARS-CoV-2/FLU/RSV plus assay is intended as an aid in the diagnosis of influenza from Nasopharyngeal swab specimens and should not be used as a sole basis for treatment. Nasal washings and aspirates are unacceptable for Xpert Xpress SARS-CoV-2/FLU/RSV testing.  Fact Sheet for Patients: bloggercourse.com  Fact Sheet for Healthcare Providers: seriousbroker.it  This test is not yet approved or cleared by the [place]  FDA and has been authorized for detection and/or diagnosis of SARS-CoV-2 by FDA under an Emergency Use Authorization (EUA). This EUA will remain in effect (meaning this test can be used) for the duration of the COVID-19 declaration under Section 564(b)(1) of the Act, 21 U.S.C. section 360bbb-3(b)(1), unless the authorization is terminated or revoked.     Resp Syncytial Virus by PCR NEGATIVE NEGATIVE Final    Comment: (NOTE) Fact Sheet for Patients: bloggercourse.com  Fact Sheet for Healthcare Providers: seriousbroker.it  This test is not yet approved or cleared by the [place]  FDA and has been authorized for detection and/or diagnosis of SARS-CoV-2 by FDA under an Emergency Use Authorization (EUA). This EUA will remain in effect (meaning this test can be used) for the duration of the COVID-19 declaration under Section 564(b)(1) of the Act, 21 U.S.C. section 360bbb-3(b)(1), unless the authorization is terminated or revoked.  Performed at [facility], [address] [place], [place] [address]           Radiology Studies: No results found.       Scheduled Meds:  allopurinol   100 mg Oral Daily   aspirin  EC  81 mg Oral Daily   atorvastatin   80 mg Oral QPM   doxazosin   2 mg Oral Daily   DULoxetine   60 mg Oral QHS   heparin   5,000 Units Subcutaneous Q8H   hydrALAZINE   100 mg Oral Q8H   insulin  aspart  0-15 Units Subcutaneous TID WC   insulin  aspart  0-5 Units Subcutaneous QHS   insulin  aspart  5 Units Subcutaneous TID WC   insulin  regular human CONCENTRATED  200 Units Subcutaneous BID WC   isosorbide  mononitrate  60 mg Oral Daily   ivabradine   7.5 mg Oral BID WC   levothyroxine   150 mcg Oral QAC breakfast   pregabalin   300 mg Oral QHS   sodium  chloride flush  3 mL Intravenous Q12H   Continuous Infusions:     LOS: 3 days    Time spent: 35 Minutes    Jennifer Secrist A Rylyn Zawistowski, MD [facility]   If 7PM-7AM, please contact night-coverage [email or web address]  02/25/2024, 12:43 PM

## 2024-02-25 NOTE — Consult Note (Signed)
 Cardiology Progress Note   Patient ID: Jennifer [name] MRN: [id number]; DOB: 1990/06/24  Admit date: 02/22/2024 Date of Consult: 02/25/2024  PCP:  Emilio Joesph DEL, PA-C   [facility] Providers Cardiologist:  [name], MD        Patient Profile: Jennifer [name] is a 33 y.o. female with a history of CAD with occluded RCA noted on cardiac catheterization in 11/2020 (treated medically), chronic HFrEF with EF as low as 25-30% in 2022 with subsequent normalization to 50-55% in 01/2024, pulmonary hypertension, systemic hypertension, hyperlipidemia with severe hypertriglyceridemia ,insulin -dependent diabetes mellitus with gastroparesis, chronic pancreatitis, non-alcoholic fatty liver disease, CKD stage IV, hypothyroidism, obstructive sleep apnea unable to tolerate CPAP,  astrocytoma s/p resection in 1997 who is being seen 02/25/2024 for the evaluation of CHF at the request of Dr. Madelyne.  Dx with acute on chronic HFrEF in setting of NICM with recovered EF, CKD stage 4 and PHTN.  At baseline she is NYHA stage 3. Most recent d/c weight 64.4kg on 01/02/24 and on this admit 70kg. Gained 10-15lbs recently.   On diuretic therapy and put out 1.8 L yesterday and is net -840 cc since admission.  SCr bumped from 3.24->>3.48 today.  Feels back to baseline today.  No SOB or LE edema  Past Medical History:  Diagnosis Date   Afib (HCC) 05/12/2021   Brain tumor (HCC) 03/29/1995   astrocytoma   CHF (congestive heart failure) (HCC)    Cholesterosis    CKD (chronic kidney disease) stage 4, GFR 15-29 ml/min (HCC) 05/13/2021   DM (diabetes mellitus) (HCC) 10/10/2018   Fatty liver    Gastroparesis due to DM (HCC) 05/12/2021   HTN (hypertension) 10/10/2018   Hypothyroidism 10/10/2018   Lipoprotein deficiency    Lung disease    longevity long term   Pancreatitis    Polycystic ovary syndrome     Past Surgical History:  Procedure Laterality Date   ABDOMINAL SURGERY     pt  states during miscarriage got her intestine   BRAIN SURGERY     ESOPHAGOGASTRODUODENOSCOPY N/A 09/01/2023   Procedure: EGD (ESOPHAGOGASTRODUODENOSCOPY);  Surgeon: Abran Norleen SAILOR, MD;  Location: [facility];  Service: Gastroenterology;  Laterality: N/A;   EYE MUSCLE SURGERY Right 03/28/2014   PRESSURE SENSOR/CARDIOMEMS N/A 12/06/2021   Procedure: PRESSURE SENSOR/CARDIOMEMS;  Surgeon: [name] Toribio SAUNDERS, MD;  Location: [facility];  Service: Cardiovascular;  Laterality: N/A;   RIGHT HEART CATH N/A 08/05/2021   Procedure: RIGHT HEART CATH;  Surgeon: [name] Toribio SAUNDERS, MD;  Location: [facility];  Service: Cardiovascular;  Laterality: N/A;   RIGHT HEART CATH N/A 12/06/2021   Procedure: RIGHT HEART CATH;  Surgeon: [name] Toribio SAUNDERS, MD;  Location: [facility];  Service: Cardiovascular;  Laterality: N/A;   RIGHT HEART CATH N/A 12/13/2023   Procedure: RIGHT HEART CATH;  Surgeon: Zenaida Morene PARAS, MD;  Location: [facility] INVASIVE CV LAB;  Service: Cardiovascular;  Laterality: N/A;   RIGHT/LEFT HEART CATH AND CORONARY ANGIOGRAPHY N/A 12/03/2020   Procedure: RIGHT/LEFT HEART CATH AND CORONARY ANGIOGRAPHY;  Surgeon: Bergamo Gordy, MD;  Location: [facility];  Service: Cardiovascular;  Laterality: N/A;   VENTRICULOSTOMY  03/28/1997       Scheduled Meds:  allopurinol   100 mg Oral Daily   aspirin  EC  81 mg Oral Daily   atorvastatin   80 mg Oral QPM   doxazosin   2 mg Oral Daily   DULoxetine   60 mg Oral QHS   heparin   5,000 Units Subcutaneous Q8H   hydrALAZINE   100 mg Oral Q8H   insulin  aspart  0-15 Units Subcutaneous TID WC   insulin  aspart  0-5 Units Subcutaneous QHS   insulin  aspart  5 Units Subcutaneous TID WC   insulin  regular human CONCENTRATED  200 Units Subcutaneous BID WC   isosorbide  mononitrate  60 mg Oral Daily   ivabradine   7.5 mg Oral BID WC   levothyroxine   150 mcg Oral QAC breakfast   pregabalin   300 mg Oral QHS   sodium chloride  flush  3 mL Intravenous Q12H    torsemide   80 mg Oral Daily   Continuous Infusions:   PRN Meds: acetaminophen , sodium chloride  flush  Allergies:    Allergies  Allergen Reactions   Icosapent  Ethyl (Epa Ethyl Ester) (Fish) Hives   Ketamine Other (See Comments)    In a vegetative state for 15 minutes, per pt   Maitake Itching and Other (See Comments)    Itchy throat   Morphine  Hives, Itching, Rash and Other (See Comments)    Sore throat, too   Mushroom Other (See Comments)    Pt has never had mushrooms but tested positive on allergy test   Penicillins Hives, Itching and Rash    Tolerates cephalosporins   Shellfish Allergy Other (See Comments)    Pt has never had shellfish, but tested positive on allergy test. Pt states contrast in CT is okay.   Fentanyl  Nausea And Vomiting   Prednisone Rash    Social History:   Social History   Socioeconomic History   Marital status: Divorced    Spouse name: Not on file   Number of children: 0   Years of education: Not on file   Highest education level: Not on file  Occupational History   Occupation: Dance Movement Psychotherapist   Occupation: N/A  Tobacco Use   Smoking status: Never   Smokeless tobacco: Never  Vaping Use   Vaping status: Never Used  Substance and Sexual Activity   Alcohol  use: Never   Drug use: Never   Sexual activity: Yes  Other Topics Concern   Not on file  Social History Narrative   Not on file   Social Drivers of Health   Financial Resource Strain: Medium Risk (01/22/2024)   Received from [facility]   Overall Financial Resource Strain (CARDIA)    How hard is it for you to pay for the very basics like food, housing, medical care, and heating?: Somewhat hard  Food Insecurity: No Food Insecurity (02/23/2024)   Hunger Vital Sign    Worried About Running Out of Food in the Last Year: Never true    Ran Out of Food in the Last Year: Never true  Recent Concern: Food Insecurity - Food Insecurity Present (01/22/2024)   Received from [facility]    Hunger Vital Sign    Within the past 12 months, you worried that your food would run out before you got the money to buy more.: Sometimes true    Within the past 12 months, the food you bought just didn't last and you didn't have money to get more.: Sometimes true  Transportation Needs: No Transportation Needs (02/23/2024)   PRAPARE - Administrator, Civil Service (Medical): No    Lack of Transportation (Non-Medical): No  Recent Concern: Transportation Needs - Unmet Transportation Needs (01/22/2024)   Received from [facility]   PRAPARE - Transportation    Lack of Transportation (Medical): Patient declined    In  the past 12 months, has lack of transportation kept you from meetings, work, or from getting things needed for daily living?: Yes  Physical Activity: Inactive (01/22/2024)   Received from [facility]   Exercise Vital Sign    On average, how many days per week do you engage in moderate to strenuous exercise (like a brisk walk)?: 0 days    Minutes of Exercise per Session: Not on file  Stress: No Stress Concern Present (01/22/2024)   Received from [facility] of Occupational Health - Occupational Stress Questionnaire    Do you feel stress - tense, restless, nervous, or anxious, or unable to sleep at night because your mind is troubled all the time - these days?: Not at all  Social Connections: Socially Integrated (01/22/2024)   Received from [facility]   Social Network    How would you rate your social network (family, work, friends)?: Good participation with social networks  Intimate Partner Violence: Not At Risk (02/23/2024)   Humiliation, Afraid, Rape, and Kick questionnaire    Fear of Current or Ex-Partner: No    Emotionally Abused: No    Physically Abused: No    Sexually Abused: No    Family History:   Family History  Problem Relation Age of Onset   Diabetes Mother    Hypertension Mother    Hyperlipidemia Mother    Thyroid   disease Mother    Hypertension Father    Diabetes Father    Pancreatic cancer Paternal Aunt    Pancreatic cancer Paternal Uncle    Colon cancer Neg Hx    Esophageal cancer Neg Hx    Stomach cancer Neg Hx      ROS:  Please see the history of present illness.   Physical Exam/Data: Vitals:   02/24/24 2335 02/25/24 0440 02/25/24 0557 02/25/24 0734  BP: (!) 154/80 133/71 130/84 (!) 141/80  Pulse: 95 94    Resp: 18 18  20   Temp: 98.5 F (36.9 C) 98.5 F (36.9 C)  98.7 F (37.1 C)  TempSrc: Oral Oral  Oral  SpO2: 92% 94%  98%  Weight:  69.3 kg    Height:        Intake/Output Summary (Last 24 hours) at 02/25/2024 0921 Last data filed at 02/25/2024 0617 Gross per 24 hour  Intake 480 ml  Output 1850 ml  Net -1370 ml      02/25/2024    4:40 AM 02/24/2024    5:00 AM 02/23/2024    3:57 AM  Last 3 Weights  Weight (lbs) 152 lb 12.5 oz 151 lb 155 lb 12.8 oz  Weight (kg) 69.3 kg 68.493 kg 70.67 kg     Body mass index is 33.06 kg/m.  GEN: Well nourished, well developed in no acute distress HEENT: Normal NECK: No JVD; No carotid bruits LYMPHATICS: No lymphadenopathy CARDIAC:RRR, no murmurs, rubs, gallops RESPIRATORY:  Clear to auscultation without rales, wheezing or rhonchi  ABDOMEN: Soft, non-tender, non-distended MUSCULOSKELETAL:  No edema; No deformity  SKIN: Warm and dry NEUROLOGIC:  Alert and oriented x 3 PSYCHIATRIC:  Normal affect   Telemetry: NSR  Relevant CV Studies:  Right Cardiac Catheterization 12/13/2023: Impressions: Right heart catheterization for evaluation of volume status and calibration of cardiomems Normal left and right heart filling pressures Significant respiratory variation, but cardiomems correlates well with RHC numbers Reduced cardiac output by assumed Fick, though PA saturation similar to previous cath in 2023 Elevated SVR of 2857dynes/sec*cm-5   Recommendations: Increase afterload reduction,  hold diuretics today, start back orals  tomorrow When evaluating cardiomems, would look at the PA tracings and use the end expiratory values when looking at [facility] _______________  Echocardiogram 02/15/2024: Impressions: 1. Left ventricular ejection fraction, by estimation, is 50 to 55%. The  left ventricle has low normal function. The left ventricle has no regional  wall motion abnormalities. There is mild concentric left ventricular  hypertrophy. Left ventricular  diastolic parameters are consistent with Grade I diastolic dysfunction  (impaired relaxation).   2. Right ventricular systolic function is normal. The right ventricular  size is normal. There is mildly elevated pulmonary artery systolic  pressure. The estimated right ventricular systolic pressure is 43.8 mmHg.   3. The mitral valve is normal in structure. Trivial mitral valve  regurgitation. No evidence of mitral stenosis.   4. The aortic valve is grossly normal. Aortic valve regurgitation is  mild. No aortic stenosis is present.   5. The inferior vena cava is dilated in size with >50% respiratory  variability, suggesting right atrial pressure of 8 mmHg.    Laboratory Data: High Sensitivity Troponin:  No results for input(s): TROPONINIHS in the last 720 hours.   Chemistry Recent Labs  Lab 02/23/24 0135 02/24/24 0158 02/25/24 0140  NA 136 137 132*  K 4.7 3.8 3.9  CL 94* 92* 88*  CO2 27 28 26   GLUCOSE 350* 120* 97  BUN 56* 65* 77*  CREATININE 3.11* 3.24* 3.48*  CALCIUM  9.0 9.1 9.4  GFRNONAA 20* 19* 17*  ANIONGAP 15 17* 18*    Recent Labs  Lab 02/22/24 2050  PROT 7.7  ALBUMIN  4.6  AST 121*  ALT 203*  ALKPHOS 108  BILITOT 0.5   Lipids  Recent Labs  Lab 02/24/24 0158  CHOL 326*  TRIG 989*  HDL 52  LDLCALC UNABLE TO CALCULATE IF TRIGLYCERIDE OVER 400 mg/dL  CHOLHDL 6.3    Hematology Recent Labs  Lab 02/22/24 2050 02/22/24 2230 02/23/24 0133  WBC 6.1  --  7.1  RBC 3.13*  --  3.17*  HGB 9.2* 12.2 9.3*  HCT 29.1* 36.0 29.2*  MCV  93.0  --  92.1  MCH 29.4  --  29.3  MCHC 31.6  --  31.8  RDW 15.2  --  15.0  PLT 134*  --  133*   Thyroid  No results for input(s): TSH, FREET4 in the last 168 hours.  BNP Recent Labs  Lab 02/22/24 2050  PROBNP 685.0*    DDimer No results for input(s): DDIMER in the last 168 hours.  Radiology/Studies:  DG Chest Port 1 View Result Date: 02/22/2024 EXAM: 1 VIEW(S) XRAY OF THE CHEST 02/22/2024 07:36:00 PM COMPARISON: 12/28/2023 CLINICAL HISTORY: sob FINDINGS: LUNGS AND PLEURA: Interstitial prominence in the lungs could reflect interstitial edema. No pleural effusion. No pneumothorax. HEART AND MEDIASTINUM: Cardiomegaly. BONES AND SOFT TISSUES: No acute osseous abnormality. IMPRESSION: 1. Interstitial prominence in the lungs, possibly reflecting interstitial edema. 2. Cardiomegaly. Electronically signed by: Franky Crease MD 02/22/2024 07:43 PM EST RP Workstation: [id number]     Assessment and Plan:  Acute on Chronic HFrEF Non-Ischemic Cardiomyopathy Patient has a long history of non-ischemic cardiomyopathy with EF as low as 25-30% in 2022 but has subsequently normalized. She has had multiple hospitalizations over the years for acute on chronic CHF and had cardiomems placed in 2023. She was admitted in 11/2023 and again in 12/2023 for this. RHC during admission in 11/2023 showed reduced cardiac output by assumed Fick (though PA sat was similar to prior  cath in 2023), severely elevated of SVR, and normal left/ right filling pressures. Cardiomems correlated well with RHC numbers.  Recent Echo on 02/05/2024 showed LVEF of 50-55% with normal wall motion, mild LVH, and grade 1 diastolic dysfunction as well as normal RV function, mild AI, and mildly elevated PASP. BNP elevated at 685.  Chest x-ray showed interstitial prominence in the lung possibly reflecting interstitial edema. She was started on IV Lasix .  - Volume overloaded. Weight 145 lbs on recent discharge in 12/2023 ; wt 152lbs 02/07/24 in  AH[facility] clinic. Admit wt 155.8lbs; wt today 153 pounds - GDMT limited by renal function.  - SCr continues to climb with diuresis despite changing to oral diuretics (2.93>>3.11>>3.2>> 3.48 today). - Not on ACEi/ ARB/ ARNI or MRA due to renal function and recurrent hyperkalemia. - Not on SGLT2 inhibitor given renal function and uncontrolled diabetes. - Not on beta-blocker given low cardiac output on recent RHC. - Continue Imdur  60 mg daily, hydralazine  100 mg 3 times daily and ivabradine  7.5 mg twice daily  - Continue daily weights, strict I/Os, and renal function. - she is at her dry weight - transitioned back to torsemide  80 mg yesterday from IV Lasix  (was on 60 mg daily as an outpatient) - discussed with Dr. [name] with AHF. Given she is at her dry weight from last OV and euvolemic on exam and breathing back to baseline, her increase is SCr is likely related to over diuresis.   -Will hold Torsemide  for 2 days and then restart at 60mg  daily  Pulmonary Hypertension Mild to moderate pulmonary hypertension on RHC in 07/2021. Prior VQ scan negative. Recent RHC in 11/2023 showed reduced cardiac output by assumed Fick (though PA sat was similar to prior cath in 2023), severely elevated of SVR, and normal left/ right filling pressures.   - Previously did not tolerate Sildenafil  in the past due to dizziness and low BP. - Continue diuresis as above. - Of note, she also has obstructive sleep apnea but is non-compliant with her PCP.  Elevated Troponin CAD LHC in 2022 showed single vessel RCA occlusion (small co-dominant vessel). Medical therapy was recommended. High-sensitivity troponin minimally elevated but flat in the 70s and c/w demand ischemia in setting of acute CHF - denies any anginal sx - Continue aspirin  81 mg daily and Lipitor  80 mg daily - no further ischemic workup indicated at this time.  Hypertension BP initially markedly elevated with systolic BP of 189.  BP stable this am at 141/80  mmHg - Continue doxazosin  2 mg daily, hydralazine  100 mg 3 times daily, Imdur  60 mg daily   Hyperlipidemia Severe Hypertriglyceridemia Lipid panel in 11/2023: Total Cholesterol 798 and Triglycerides >4,425. Unable to calculate HDL or LDL given markedly elevated triglycerides. Direct LDL 48.  - Complicated by chronic pancreatitis - Continue atorvastatin  80 mg daily - Previously on Fenofibrate  but this was stopped on admission (presuming due to eGFR <30).  - Previously seen by Dr. Mona. Recommend she follow back up with after discharge.  AKI on CKD Stage IV Renal biopsy in 12/2021 showed DM and HTN nephropathy.  - Baseline creatinine around 3.0. Creatinine 2.93 on admission. - Creatinine continues to trend upward to 3.48 today - Currently on torsemide  80 mg daily - Discussed with AHF and likely over diuresed - hold torsemide  x 2 days and then restart at PTA dose of 60mg  daily  Hyperkalemia Potassium 6.6 on admission. Treated with Sodium Bicarb, Insulin , and Lokelma . - K+ 3.9 today - Continue to  monitor closely.   Type 3c DM -secondary to chronic pancreatitis -followed at [facility] -on Insulin   Chronic abdominal swelling/bloating -seen at [facility] for IBS -had pneumatosis intestinalis -also has DM gastroparesis -started on pancreatic enzyme replacement -fatty liver  Otherwise, per primary team: - Uncontrolled diabetes mellitus - Hypothyroidism  - Chronically elevated LFTs - Hyponatremia - Obstructive sleep apnea  I spent 35 minutes caring for this patient today face to face, ordering and reviewing labs, reviewing records from 2D echo 11/10/202, RHC 12/13/2023, seeing the patient, documenting in the record  For questions or updates, please contact [facility] Please consult [email or web address] for contact info under   Signed, Wilbert Bihari, MD  02/25/2024 9:21 AM

## 2024-02-25 NOTE — Discharge Instructions (Signed)
 Resume Torsemide  on Wednesday   You need lab work on Tuesday, to check your renal function.

## 2024-02-25 NOTE — Plan of Care (Signed)
  Problem: Clinical Measurements: Goal: Will remain free from infection Outcome: Progressing Goal: Respiratory complications will improve Outcome: Progressing Goal: Cardiovascular complication will be avoided Outcome: Progressing   Problem: Activity: Goal: Risk for activity intolerance will decrease Outcome: Progressing   Problem: Nutrition: Goal: Adequate nutrition will be maintained Outcome: Progressing   Problem: Elimination: Goal: Will not experience complications related to bowel motility Outcome: Progressing Goal: Will not experience complications related to urinary retention Outcome: Progressing   Problem: Safety: Goal: Ability to remain free from injury will improve Outcome: Progressing   Problem: Education: Goal: Ability to describe self-care measures that may prevent or decrease complications (Diabetes Survival Skills Education) will improve Outcome: Progressing   Problem: Metabolic: Goal: Ability to maintain appropriate glucose levels will improve Outcome: Progressing

## 2024-02-26 ENCOUNTER — Other Ambulatory Visit (HOSPITAL_COMMUNITY): Payer: Self-pay

## 2024-02-26 NOTE — Progress Notes (Signed)
 Paramedicine Encounter    Patient ID: Jennifer [name], female    DOB: 1990/05/07, 33 y.o.   MRN: [id number]   Complaints-feels ok- still feels like she had fluid on her  Edema-  Compliance with meds-yes   Pill box filled-yes If so, by whom-paramedic   Refills needed-none     Pt was released from hosp yesterday due to CHF and hyperkalemia.  Med changes were to stop the fenofibrate  and the atogepant but she has not started that one yet.  Also to hold her torsemide  until Wednesday this week.  Weight same as last week. Pt still feels like she has fluid on her.  She said she is suppose to see someone in heart clinic this week. [name] going to call today to set that up.  She reports sob with walking far distances.  She denies dizziness, still feels bloated. She cannot resume torsemide  until Wednesday so cautioned her on salt intake and fluid intake.  She reports she will only take fluid with taking her meds.   Meds verified and pill box refilled.   There are a lot of minute maid containers and electrolyte drinks in the kitchen but she has a roommate and says those are his.   Will continue to f/u.   CBG PTA-132  BP 128/80   Pulse 100   Resp 16   Wt 150 lb (68 kg)   SpO2 97%   BMI 32.46 kg/m  Weight yesterday-153 @ hosp  Last visit weight-150  Patient Care Team: [name] as PCP - General (Physician Assistant) Tommas Pears, MD as PCP - Endocrinology (Endocrinology) Ladona Heinz, MD as PCP - Cardiology (Cardiology)  Patient Active Problem List   Diagnosis Date Noted   Pure hypercholesterolemia 02/24/2024   CKD (chronic kidney disease) stage 4, GFR 15-29 ml/min (HCC) 02/23/2024   Hyperglycemia due to diabetes mellitus (HCC) 02/23/2024   Pulmonary HTN (HCC) 02/23/2024   NICM (nonischemic cardiomyopathy) (HCC) 02/23/2024   Acute CHF (congestive heart failure) (HCC) 02/22/2024   Skin infection 01/02/2024   Acute on chronic heart failure with mildly  reduced ejection fraction (HFmrEF) (HCC) 12/28/2023   Hypertensive urgency 12/28/2023   Acute on chronic heart failure with normal ejection fraction (HCC) 12/07/2023   Controlled type 2 diabetes mellitus with hyperglycemia (HCC) 08/31/2023   Nausea without vomiting 08/31/2023   Epigastric pain 08/31/2023   Sleep apnea 08/30/2023   Abscess 08/30/2023   SOB (shortness of breath) 08/20/2023   Dizziness 08/20/2023   Chronic pancreatitis (HCC) 08/15/2023   Witnessed episode of apnea 06/27/2023   Pseudohyponatremia 04/10/2023   Metabolic acidosis 04/10/2023   Hypertriglyceridemia 04/10/2023   Peripheral neuropathy 04/10/2023   Type 2 MI (myocardial infarction) (HCC) 01/19/2023   Atypical chest pain 01/19/2023   Coronary artery disease due to lipid rich plaque 01/18/2023   Chest pain due to GERD 01/18/2023   Enteritis 01/02/2023   Syncope 01/02/2023   LFT elevation 01/02/2023   Bladder wall thickening 01/02/2023   Hyperglycemia in setting of T2DM 09/19/2022   Hyperbilirubinemia 09/07/2022   UTI (urinary tract infection) due to Enterococcus 06/01/2022   E. coli UTI (urinary tract infection) 06/01/2022   DKA (diabetic ketoacidosis) (HCC) 06/01/2022   ASCUS of cervix with negative high risk HPV 05/31/2022   Acute on chronic pancreatitis (HCC) 05/29/2022   Hypokalemia 03/23/2022   COVID 03/15/2022   COVID-19 virus infection 03/14/2022   Hyponatremia 03/14/2022   Positive D dimer 03/14/2022   Stage 3b chronic kidney disease (  CKD) (HCC) 03/14/2022   Pulmonary hypertension (HCC) 02/06/2022   NASH (nonalcoholic steatohepatitis) 02/06/2022   Obesity (BMI 30-39.9) 02/06/2022   Heart failure (HCC) 12/03/2021   Acute on chronic diastolic CHF (congestive heart failure) (HCC) 12/02/2021   Elevated troponin 12/02/2021   Pancytopenia (HCC) 12/02/2021   Amenorrhea 12/02/2021   Hepatic steatosis 09/27/2021   Hyperosmolar hyperglycemic state (HHS) (HCC) 08/26/2021   Small intestinal bacterial  overgrowth (SIBO) 08/26/2021   Lower extremity edema 08/26/2021   Hyperkalemia 08/14/2021   Hx of insulin  dependent diabetes mellitus 08/14/2021   CKD stage 3b, GFR 30-44 ml/min (HCC) 05/13/2021   Gastroparesis due to DM (HCC) 05/12/2021   Abdominal distension 05/12/2021   Chest pain 03/10/2021   Acute on chronic combined systolic and diastolic CHF (congestive heart failure) (HCC) 02/09/2021   History of astrocytoma of brain 02/09/2021   Type 2 diabetes mellitus with hyperglycemia, with long-term current use of insulin  (HCC) 01/25/2021   Diarrhea    Elevated transaminase level    Orthostatic hypotension 11/28/2020   Acute combined systolic and diastolic heart failure (HCC)    Nonischemic cardiomyopathy (HCC)    Acute systolic congestive heart failure (HCC)    Elevated liver enzymes    Acute renal failure superimposed on stage 3b chronic kidney disease (HCC) 11/26/2020   Intractable abdominal pain 11/23/2020   Chronic systolic congestive heart failure (HCC) 11/23/2020   Primary hypertension 11/23/2020   Familial hypertriglyceridemia 11/23/2020   Prolonged QT interval 11/23/2020   Finger mass, left 08/12/2020   Nausea and vomiting 07/23/2020   Polycystic ovary syndrome 01/30/2020   Moderate aortic insufficiency 08/16/2019   Moderate mitral regurgitation 08/16/2019   Lipoprotein lipase deficiency, familial 06/19/2019   Microalbuminuria due to type 1 diabetes mellitus (HCC) 06/19/2019   Type 2 diabetes mellitus with hyperlipidemia (HCC) 10/10/2018   Hypothyroidism 10/10/2018    Current Outpatient Medications:    albuterol  (VENTOLIN  HFA) 108 (90 Base) MCG/ACT inhaler, Inhale 2 puffs into the lungs daily as needed for wheezing or shortness of breath., Disp: , Rfl:    allopurinol  (ZYLOPRIM ) 100 MG tablet, Take 1 tablet (100 mg total) by mouth daily., Disp: 90 tablet, Rfl: 3   aspirin  EC 81 MG tablet, Take 1 tablet (81 mg total) by mouth daily. Swallow whole., Disp: 30 tablet, Rfl: 1    atorvastatin  (LIPITOR ) 80 MG tablet, Take 1 tablet (80 mg total) by mouth every evening., Disp: 30 tablet, Rfl: 0   azelastine  (ASTELIN ) 0.1 % nasal spray, Place 2 sprays into both nostrils daily as needed for rhinitis or allergies., Disp: , Rfl:    calcitRIOL  (ROCALTROL ) 0.25 MCG capsule, Take 0.25 mcg by mouth daily., Disp: , Rfl:    doxazosin  (CARDURA ) 2 MG tablet, Take 1 tablet (2 mg total) by mouth daily., Disp: 90 tablet, Rfl: 3   DULoxetine  (CYMBALTA ) 60 MG capsule, Take 60 mg by mouth at bedtime., Disp: , Rfl:    ergocalciferol  (VITAMIN D2) 1.25 MG (50000 UT) capsule, Take 1 capsule (50,000 Units total) by mouth every Sunday., Disp: 4 capsule, Rfl: 0   hydrALAZINE  (APRESOLINE ) 100 MG tablet, Take 1 tablet (100 mg total) by mouth every 8 (eight) hours., Disp: 90 tablet, Rfl: 0   isosorbide  mononitrate (IMDUR ) 60 MG 24 hr tablet, Take 1 tablet (60 mg total) by mouth daily., Disp: 30 tablet, Rfl: 0   ivabradine  (CORLANOR ) 7.5 MG TABS tablet, Take 1 tablet (7.5 mg total) by mouth 2 (two) times daily with a meal., Disp: 60 tablet, Rfl: 11  lansoprazole  (PREVACID ) 30 MG capsule, TAKE 1 CAPSULE (30 MG TOTAL) BY MOUTH 2 (TWO) TIMES DAILY BEFORE A MEAL., Disp: 60 capsule, Rfl: 2   levocetirizine (XYZAL ) 5 MG tablet, Take 5 mg by mouth at bedtime., Disp: , Rfl:    levothyroxine  (SYNTHROID ) 150 MCG tablet, Take 150 mcg by mouth daily before breakfast., Disp: , Rfl:    pregabalin  (LYRICA ) 300 MG capsule, Take 300 mg by mouth at bedtime., Disp: , Rfl:    promethazine  (PHENERGAN ) 25 MG tablet, Take 25 mg by mouth at bedtime., Disp: , Rfl:    [START ON 02/28/2024] torsemide  (DEMADEX ) 20 MG tablet, Take 3 tablets (60 mg total) by mouth daily., Disp: 90 tablet, Rfl: 2   amitriptyline  (ELAVIL ) 10 MG tablet, Take 10 mg by mouth. (Patient taking differently: Take 10 mg by mouth at bedtime.), Disp: , Rfl:    BAQSIMI ONE PACK 3 MG/DOSE POWD, Place 1 Dose into the nose once as needed (hypoglycemia)., Disp: , Rfl:     dicyclomine  (BENTYL ) 20 MG tablet, Take 1 tablet (20 mg total) by mouth 2 (two) times daily., Disp: 20 tablet, Rfl: 0   Glucose Blood (BLOOD GLUCOSE TEST STRIPS) STRP, 1 each by Does not apply route 3 (three) times daily. Use as directed to check blood sugar. May dispense any manufacturer covered by patient's insurance and fits patient's device., Disp: 100 strip, Rfl: 0   HUMALOG  KWIKPEN 100 UNIT/ML KwikPen, Inject 60 Units into the skin in the morning, at noon, and at bedtime., Disp: , Rfl:    HUMULIN  R U-500 KWIKPEN 500 UNIT/ML KwikPen, Inject 300 Units into the skin in the morning and at bedtime., Disp: , Rfl:    Insulin  Pen Needle (PEN NEEDLES) 31G X 5 MM MISC, Use as directed 3 (three) times daily., Disp: 100 each, Rfl: 0   Lancet Device MISC, 1 each by Does not apply route 3 (three) times daily. May dispense any manufacturer covered by patient's insurance., Disp: 1 each, Rfl: 0   Lancets MISC, 1 each by Does not apply route 3 (three) times daily. Use as directed to check blood sugar. May dispense any manufacturer covered by patient's insurance and fits patient's device., Disp: 100 each, Rfl: 0   methylPREDNISolone  (MEDROL  DOSEPAK) 4 MG TBPK tablet, Take as directed, Disp: 21 each, Rfl: 0   Safety Seal Miscellaneous MISC, Hormonic Hair Solution with minoxidil USP 7% and finasteride USP 0.05% - apply to affected areas daily every morning., Disp: 30 mL, Rfl: 2   SUMAtriptan  (IMITREX ) 100 MG tablet, Take 100 mg by mouth daily as needed., Disp: , Rfl:    tretinoin  (RETIN-A ) 0.05 % cream, Apply 1 Application topically at bedtime., Disp: , Rfl:  Allergies  Allergen Reactions   Icosapent  Ethyl (Epa Ethyl Ester) (Fish) Hives   Ketamine Other (See Comments)    In a vegetative state for 15 minutes, per pt   [place] Itching and Other (See Comments)    Itchy throat   Morphine  Hives, Itching, Rash and Other (See Comments)    Sore throat, too   Mushroom Other (See Comments)    Pt has never had  mushrooms but tested positive on allergy test   Penicillins Hives, Itching and Rash    Tolerates cephalosporins   Shellfish Allergy Other (See Comments)    Pt has never had shellfish, but tested positive on allergy test. Pt states contrast in CT is okay.   Fentanyl  Nausea And Vomiting   Prednisone Rash  Social History   Socioeconomic History   Marital status: Divorced    Spouse name: Not on file   Number of children: 0   Years of education: Not on file   Highest education level: Not on file  Occupational History   Occupation: Dance Movement Psychotherapist   Occupation: N/A  Tobacco Use   Smoking status: Never   Smokeless tobacco: Never  Vaping Use   Vaping status: Never Used  Substance and Sexual Activity   Alcohol  use: Never   Drug use: Never   Sexual activity: Yes  Other Topics Concern   Not on file  Social History Narrative   Not on file   Social Drivers of Health   Financial Resource Strain: Medium Risk (01/22/2024)   Received from [facility]   Overall Financial Resource Strain (CARDIA)    How hard is it for you to pay for the very basics like food, housing, medical care, and heating?: Somewhat hard  Food Insecurity: No Food Insecurity (02/23/2024)   Hunger Vital Sign    Worried About Running Out of Food in the Last Year: Never true    Ran Out of Food in the Last Year: Never true  Recent Concern: Food Insecurity - Food Insecurity Present (01/22/2024)   Received from [facility]   Hunger Vital Sign    Within the past 12 months, you worried that your food would run out before you got the money to buy more.: Sometimes true    Within the past 12 months, the food you bought just didn't last and you didn't have money to get more.: Sometimes true  Transportation Needs: No Transportation Needs (02/23/2024)   PRAPARE - Administrator, Civil Service (Medical): No    Lack of Transportation (Non-Medical): No  Recent Concern: Transportation Needs - Unmet  Transportation Needs (01/22/2024)   Received from [facility] - Transportation    Lack of Transportation (Medical): Patient declined    In the past 12 months, has lack of transportation kept you from meetings, work, or from getting things needed for daily living?: Yes  Physical Activity: Inactive (01/22/2024)   Received from [facility]   Exercise Vital Sign    On average, how many days per week do you engage in moderate to strenuous exercise (like a brisk walk)?: 0 days    Minutes of Exercise per Session: Not on file  Stress: No Stress Concern Present (01/22/2024)   Received from [facility] - Occupational Stress Questionnaire    Do you feel stress - tense, restless, nervous, or anxious, or unable to sleep at night because your mind is troubled all the time - these days?: Not at all  Social Connections: Socially Integrated (01/22/2024)   Received from [facility]   Social Network    How would you rate your social network (family, work, friends)?: Good participation with social networks  Intimate Partner Violence: Not At Risk (02/23/2024)   Humiliation, Afraid, Rape, and Kick questionnaire    Fear of Current or Ex-Partner: No    Emotionally Abused: No    Physically Abused: No    Sexually Abused: No    Physical Exam      Future Appointments  Date Time Provider Department Center  02/28/2024 12:00 PM [facility] PA/NP [facility] None  04/04/2024  2:30 PM [facility] PA/NP [facility] None  04/16/2024  8:00 PM Neda Jennet LABOR, MD [facility]  04/26/2024 11:00 AM Hope Almarie ORN,  NP [facility] [address]  05/07/2024  3:15 PM Alm Jennifer SAILOR, DO [facility] None       Izetta Quivers, Paramedic [phone] [facility] Paramedic  02/26/24

## 2024-02-27 ENCOUNTER — Telehealth (HOSPITAL_COMMUNITY): Payer: Self-pay

## 2024-02-27 NOTE — Telephone Encounter (Signed)
 Called to confirm/remind patient of their appointment [facility] on 02/28/24 12:00.   Appointment:   [x] Confirmed  [] Left mess   [] No answer/No voice mail  [] VM Full/unable to leave message  [] Phone not in service  Patient reminded to bring all medications and/or complete list.  Confirmed patient has transportation. Gave directions, instructed to utilize valet parking.

## 2024-02-27 NOTE — Progress Notes (Signed)
 Advanced [facility] Clinic Note   PCP: Emilio Joesph DEL, PA-C Primary Cardiologist: [name], MD HF Cardiologist: Dr. [name]    HPI: Jennifer [name] is a 33 y.o. 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   Has been followed by Dr. Bergamo for her HF.    Admitted 8/22 with vasculitis (no pancreatitis). Her TG were found to be 4030. Echo 11/28/20 EF 20-25% with moderate RV dysfunction and mild septal flattening. Started on DBA 2.5. cMRI c/w prior myocarditis vs sarcoid (see below). Mod pericardial effusion also noted. LVEF 29%. RV normal. She underwent R/LHC showing single vessel RCA occlusion, RA 1, LVEDP 17, Fick CO 5.6/CI 3.88. She was able to be weaned off inotropes and GDMT titrated.     Post hospital follow up she was hypervolemic, ReDs 43%. She followed up with Dr. Fredrica with [facility] 9/22 for a 2nd opinion. He placed LifeVest and switched beta blocker to Toprol  XL.   Echo 10/22: EF up to 45-50%, grade II DD, RV ok. LifeVest off.   Treated in ED 10/22 for pancreatitis, then admitted 11/22 for hyperglycemia.   Echo 12/22 EF 45%, RV normal.    Follow up 5/23, weight up 16 lbs, ReDs 27%. Arranged for RHC to better assess hemodynamics and see if abdominal symptoms related to right-sided HF due to high-output state or non-cardiac. This showed elevated filling pressures and normal cardiac ouput. Mild to moderate mixed PH with prominent v-waves in RA, suggestive of significant TR. Given IV lasix  and metolazone  2.5 added weekly to diuretic regimen.    Admitted 5/23 with AKI and hyperkalemia. Given IVF, insulin , calcium  gluconate and Lokelma . Echo showed EF 45-50%, RV normal, mild-mod MR, RVSP 30. Only mild TR. Restarted on torsemide  40 bid (lower dose) and Entresto  49/51 bid. Arranged paramedicine,  discharged home 143 lbs.   Seen 7/23 at [facility] for 2nd opinion, felt to be congested and recommended increasing torsemide  to 60 mg bid and instructed f/u in 3 months.   Received Furoscix  8/4, 8/5, and 10/31/21.  Admitted 9/23 for volume overload in setting of RV failure and AKI. Required milrinone . Underwent Cardiomems implant. RHC showed RA 15, PA 37/23 (28), PCWP 16, Fick CO/CI 4.2/2.6, Thermo CO/CI 3.7/2.3, PVR 4.3, PA sat 51%, 54%, PAPi 0.93  Previously being followed by Dr. Kimble at [facility]. Had renal biopsy 10/23 which showed DM and HTN nephropathy. They discussed starting HD soon. Now being followed by [facility].   Echo 10/04/22 EF 55% RV ok. Mild AI   Continued to have multiple hospitalizations and ED visits for hyperglycemia, hyperTG (>5,000), uncontrolled HTN, and abdominal pain.   Admitted 9/25 for a/c CHF in setting of poor med compliance. Echo EF 45-50%, RV mildly reduced. She was diuresed w/ IV Lasix  gtt and metolazone , sCr bumped to >3.0. Underwent RHC which showed RA 6, PA 46/23 (mean 31), PCWP 13, FICK CI 1.8, TPG 18, PVR 6.4 WU, PAPi 3.8, severely elevated SVR 2857. Significant respiratory variation, but cardiomems correlates well with RHC numbers. Of note it was recommended, when evaluating cardiomems, would look at the PA tracings and use the end expiratory values when looking at [facility]. GDMT titrated and she was re-enrolled in paramedicine. Discharged home, weight 139 lbs.  Admitted 12/28/23, sent from AHF clinic, for a/c dHF. She was diuresed with IV lasix  and discharged home, weight 145 lbs.  Readmitted last week with acute on chronic CHF. Weight was up 10-15 lb. She was diuresed with IV lasix  and had bump in Scr likely d/t overdiuresis. Torsemide  held X 2 days then reduced to prior home dose of 60 mg daily.   Cardiomems *** (goal 24)   ROS: All systems reviewed and negative except as per HPI.   Past Medical History:  Diagnosis Date   Afib (HCC) 05/12/2021   Brain tumor  (HCC) 03/29/1995   astrocytoma   CHF (congestive heart failure) (HCC)    Cholesterosis    CKD (chronic kidney disease) stage 4, GFR 15-29 ml/min (HCC) 05/13/2021   DM (diabetes mellitus) (HCC) 10/10/2018   Fatty liver    Gastroparesis due to DM (HCC) 05/12/2021   HTN (hypertension) 10/10/2018   Hypothyroidism 10/10/2018   Lipoprotein deficiency    Lung disease    longevity long term   Pancreatitis    Polycystic ovary syndrome    Current Outpatient Medications  Medication Sig Dispense Refill   albuterol  (VENTOLIN  HFA) 108 (90 Base) MCG/ACT inhaler Inhale 2 puffs into the lungs daily as needed for wheezing or shortness of breath.     allopurinol  (ZYLOPRIM ) 100 MG tablet Take 1 tablet (100 mg total) by mouth daily. 90 tablet 3   amitriptyline  (ELAVIL ) 10 MG tablet Take 10 mg by mouth. (Patient taking differently: Take 10 mg by mouth at bedtime.)     aspirin  EC 81 MG tablet Take 1 tablet (81 mg total) by mouth daily. Swallow whole. 30 tablet 1   atorvastatin  (LIPITOR ) 80 MG tablet Take 1 tablet (80 mg total) by mouth every evening. 30 tablet 0   azelastine  (ASTELIN ) 0.1 % nasal spray Place 2 sprays into both nostrils daily as needed for rhinitis or allergies.     BAQSIMI ONE PACK 3 MG/DOSE POWD Place 1 Dose into the nose once as needed (hypoglycemia).     calcitRIOL  (ROCALTROL ) 0.25 MCG capsule Take 0.25 mcg by mouth daily.     dicyclomine  (BENTYL ) 20 MG tablet Take 1 tablet (20 mg total) by mouth 2 (two) times daily. 20 tablet 0   doxazosin  (CARDURA ) 2 MG tablet Take 1 tablet (2 mg total) by mouth daily. 90 tablet 3   DULoxetine  (CYMBALTA ) 60 MG capsule Take 60 mg by mouth at bedtime.     ergocalciferol  (VITAMIN D2) 1.25 MG (50000 UT) capsule Take 1 capsule (50,000 Units total) by mouth every Sunday. 4 capsule 0   Glucose Blood (BLOOD GLUCOSE TEST STRIPS) STRP 1 each by Does not apply route 3 (three) times daily. Use as directed to check blood sugar. May dispense any manufacturer covered  by patient's insurance and fits patient's device. 100 strip 0   HUMALOG  KWIKPEN 100 UNIT/ML KwikPen Inject 60 Units into the skin in the morning, at noon, and at bedtime.     HUMULIN  R U-500 KWIKPEN 500 UNIT/ML KwikPen Inject 300 Units into the skin in the morning and at bedtime.     hydrALAZINE  (APRESOLINE ) 100 MG tablet Take 1 tablet (100 mg total) by mouth every 8 (eight) hours. 90 tablet 0   Insulin  Pen Needle (PEN NEEDLES) 31G X 5 MM MISC Use as directed 3 (three) times daily. 100 each 0   isosorbide  mononitrate (IMDUR ) 60 MG 24 hr tablet Take 1 tablet (60 mg total) by mouth daily. 30 tablet 0   ivabradine  (CORLANOR ) 7.5 MG TABS tablet Take 1 tablet (7.5 mg total) by mouth 2 (two) times daily with a meal. 60  tablet 11   Lancet Device MISC 1 each by Does not apply route 3 (three) times daily. May dispense any manufacturer covered by patient's insurance. 1 each 0   Lancets MISC 1 each by Does not apply route 3 (three) times daily. Use as directed to check blood sugar. May dispense any manufacturer covered by patient's insurance and fits patient's device. 100 each 0   lansoprazole  (PREVACID ) 30 MG capsule TAKE 1 CAPSULE (30 MG TOTAL) BY MOUTH 2 (TWO) TIMES DAILY BEFORE A MEAL. 60 capsule 2   levocetirizine (XYZAL ) 5 MG tablet Take 5 mg by mouth at bedtime.     levothyroxine  (SYNTHROID ) 150 MCG tablet Take 150 mcg by mouth daily before breakfast.     methylPREDNISolone  (MEDROL  DOSEPAK) 4 MG TBPK tablet Take as directed 21 each 0   pregabalin  (LYRICA ) 300 MG capsule Take 300 mg by mouth at bedtime.     promethazine  (PHENERGAN ) 25 MG tablet Take 25 mg by mouth at bedtime.     Safety Seal Miscellaneous MISC Hormonic Hair Solution with minoxidil USP 7% and finasteride USP 0.05% - apply to affected areas daily every morning. 30 mL 2   SUMAtriptan  (IMITREX ) 100 MG tablet Take 100 mg by mouth daily as needed.     [START ON 02/28/2024] torsemide  (DEMADEX ) 20 MG tablet Take 3 tablets (60 mg total) by mouth  daily. 90 tablet 2   tretinoin  (RETIN-A ) 0.05 % cream Apply 1 Application topically at bedtime.     No current facility-administered medications for this visit.   Allergies  Allergen Reactions   Icosapent  Ethyl (Epa Ethyl Ester) (Fish) Hives   Ketamine Other (See Comments)    In a vegetative state for 15 minutes, per pt   Maitake Itching and Other (See Comments)    Itchy throat   Morphine  Hives, Itching, Rash and Other (See Comments)    Sore throat, too   Mushroom Other (See Comments)    Pt has never had mushrooms but tested positive on allergy test   Penicillins Hives, Itching and Rash    Tolerates cephalosporins   Shellfish Allergy Other (See Comments)    Pt has never had shellfish, but tested positive on allergy test. Pt states contrast in CT is okay.   Fentanyl  Nausea And Vomiting   Prednisone Rash   Social History   Socioeconomic History   Marital status: Divorced    Spouse name: Not on file   Number of children: 0   Years of education: Not on file   Highest education level: Not on file  Occupational History   Occupation: Dance Movement Psychotherapist   Occupation: N/A  Tobacco Use   Smoking status: Never   Smokeless tobacco: Never  Vaping Use   Vaping status: Never Used  Substance and Sexual Activity   Alcohol  use: Never   Drug use: Never   Sexual activity: Yes  Other Topics Concern   Not on file  Social History Narrative   Not on file   Social Drivers of Health   Financial Resource Strain: Medium Risk (01/22/2024)   Received from [facility]   Overall Financial Resource Strain (CARDIA)    How hard is it for you to pay for the very basics like food, housing, medical care, and heating?: Somewhat hard  Food Insecurity: No Food Insecurity (02/23/2024)   Hunger Vital Sign    Worried About Running Out of Food in the Last Year: Never true    Ran Out of Food in the Last Year: Never  true  Recent Concern: Food Insecurity - Food Insecurity Present (01/22/2024)   Received  from [facility]   Hunger Vital Sign    Within the past 12 months, you worried that your food would run out before you got the money to buy more.: Sometimes true    Within the past 12 months, the food you bought just didn't last and you didn't have money to get more.: Sometimes true  Transportation Needs: No Transportation Needs (02/23/2024)   PRAPARE - Administrator, Civil Service (Medical): No    Lack of Transportation (Non-Medical): No  Recent Concern: Transportation Needs - Unmet Transportation Needs (01/22/2024)   Received from [facility] - Transportation    Lack of Transportation (Medical): Patient declined    In the past 12 months, has lack of transportation kept you from meetings, work, or from getting things needed for daily living?: Yes  Physical Activity: Inactive (01/22/2024)   Received from [facility]   Exercise Vital Sign    On average, how many days per week do you engage in moderate to strenuous exercise (like a brisk walk)?: 0 days    Minutes of Exercise per Session: Not on file  Stress: No Stress Concern Present (01/22/2024)   Received from [facility] - Occupational Stress Questionnaire    Do you feel stress - tense, restless, nervous, or anxious, or unable to sleep at night because your mind is troubled all the time - these days?: Not at all  Social Connections: Socially Integrated (01/22/2024)   Received from [facility]   Social Network    How would you rate your social network (family, work, friends)?: Good participation with social networks  Intimate Partner Violence: Not At Risk (02/23/2024)   Humiliation, Afraid, Rape, and Kick questionnaire    Fear of Current or Ex-Partner: No    Emotionally Abused: No    Physically Abused: No    Sexually Abused: No   Family History  Problem Relation Age of Onset   Diabetes Mother    Hypertension Mother    Hyperlipidemia Mother    Thyroid   disease Mother    Hypertension Father    Diabetes Father    Pancreatic cancer Paternal Aunt    Pancreatic cancer Paternal Uncle    Colon cancer Neg Hx    Esophageal cancer Neg Hx    Stomach cancer Neg Hx    Wt Readings from Last 3 Encounters:  02/26/24 68 kg (150 lb)  02/25/24 69.3 kg (152 lb 12.5 oz)  02/19/24 68 kg (150 lb)   There were no vitals taken for this visit.  Physical Exam  General:  Sitting up. No resp difficulty HEENT: normal Neck: supple. no JVD.  Cor: Regular tachy 2/6 TR Lungs: clear Abdomen: obese soft, nontender, nondistended.Good bowel sounds. Extremities: no cyanosis, clubbing, rash, edema Neuro: alert & orientedx3, cranial nerves grossly intact. moves all 4 extremities w/o difficulty. Affect pleasant   ECG (personally reviewed): n/a  Cardiomems (personally reviewed): PAD 23, (goal 24)  ReDs reading: n/a  ASSESSMENT & PLAN: Nonischemic cardiomyopathy, recovered EF, previously with HFrEF (EF 29%) - prev reduced EF to 29%.  - Echo 9/25: EF 45-50%, RV mildly reduced - RHC 9/25: RA 6, PA 46/23 (mean 31), PCWP 13, FICK CI 1.8, TPG 18, PVR 6.4 WU, PAPi 3.8, severely elevated SVR 2857 - Stable NYHA III (multifactorial) Volume ok on exam and cardiomems - GDMT limited by CKD -  Continue torsemide  60 mg daily, OK to take extra 20 mg PRN - Continue hydralazine  100 mg tid + Imdur  60 mg daily - Continue ivabradine  7.5 mg bid. - No beta blocker with low CI on RHC - Labs today - Continue Paramedicine  2. CAD - Single vessel RCA occlusion (small co-dominant vessel) on cardiac cath 09/08. - No s/s angina - Continue medical management   3.  CKD IV - Scr baseline variable with frequent AKIs  - Previously followed by Dr. Kimble (Nephrology at [facility]) - Had renal biopsy (10/23) and showed DM and HTN nephropathy.  - Now followed by [facility] - Baseline SCr now ~ 3 - Labs today   4. Pulmonary hypertension - Mild to moderate on RHC 5/23. PVR 3.7 WU. - RHC 9/23: RA 15,  PA 37/23 (28), PCWP 16,  Thermo CO/CI 3.7/2.3, PVR 4.3, PA sat 51%, 54%, PAPi 0.93  - RHC 9/25 RA 6, PA 46/23 (mean 31), PCWP 13, FICK CI 1.8, TPG 18, PVR 6.4 WU, PAPi 3.8, severely elevated SVR 2857 - Echo 9/25, RV mildly reduced  - V/Q negative - Off sildenafil  d/t dizziness and low BP - No change   5. TR - Prominent v-waves in RA tracing on RHC 5/23. - prev stable mild/mod - Trivial on echo 9/23  - Mild on echo 9/25  6. Uncontrolled HTN - BP elevated - Add doxazosin  2mg    7. OSA - Unable to tolerate CPAP mask - Follows with [facility]  8. Severe hyperTG - Due to LPL deficiency w/ recent pancreatitis. - Follows in [facility] (Dr. Mona)  - Continue fenofibrate  + atorvastatin   9. Type 3c DM - 2/2 chronic pancreatitis  - Follows with [facility] - On insulin   10. Chronic abdominal swelling/bloating - Seen at [facility] (11/20) for IBS, per chart review. - Had penumatosis intestinalis. She was started on pancreatic enzyme replacement. - Has DM gastroparesis - Suspect primarily fatty liver - no change  11. Poor Med Compliance/SDOH - Doing better with paramedicine back on board - Appreciate their assistance   Follow-up: ***   Ariella Voit N, PA-C  8:57 PM

## 2024-02-28 ENCOUNTER — Ambulatory Visit (HOSPITAL_COMMUNITY): Admission: RE | Admit: 2024-02-28 | Discharge: 2024-02-28 | Attending: Physician Assistant

## 2024-02-28 ENCOUNTER — Ambulatory Visit (HOSPITAL_COMMUNITY): Payer: Self-pay | Admitting: Physician Assistant

## 2024-02-28 ENCOUNTER — Encounter (HOSPITAL_COMMUNITY): Payer: Self-pay

## 2024-02-28 VITALS — BP 200/90 | HR 103 | Ht <= 58 in | Wt 156.0 lb

## 2024-02-28 DIAGNOSIS — I428 Other cardiomyopathies: Secondary | ICD-10-CM | POA: Diagnosis not present

## 2024-02-28 DIAGNOSIS — N184 Chronic kidney disease, stage 4 (severe): Secondary | ICD-10-CM | POA: Diagnosis not present

## 2024-02-28 DIAGNOSIS — I5032 Chronic diastolic (congestive) heart failure: Secondary | ICD-10-CM | POA: Diagnosis not present

## 2024-02-28 DIAGNOSIS — I10 Essential (primary) hypertension: Secondary | ICD-10-CM

## 2024-02-28 LAB — BASIC METABOLIC PANEL WITH GFR
Anion gap: 11 (ref 5–15)
BUN: 68 mg/dL — ABNORMAL HIGH (ref 6–20)
CO2: 25 mmol/L (ref 22–32)
Calcium: 9.5 mg/dL (ref 8.9–10.3)
Chloride: 102 mmol/L (ref 98–111)
Creatinine, Ser: 2.82 mg/dL — ABNORMAL HIGH (ref 0.44–1.00)
GFR, Estimated: 22 mL/min — ABNORMAL LOW (ref >60)
Glucose, Bld: 310 mg/dL — ABNORMAL HIGH (ref 70–99)
Potassium: 5 mmol/L (ref 3.5–5.1)
Sodium: 138 mmol/L (ref 135–145)

## 2024-02-28 LAB — BRAIN NATRIURETIC PEPTIDE: B Natriuretic Peptide: 77.1 pg/mL (ref 0.0–100.0)

## 2024-02-28 MED ORDER — DOXAZOSIN MESYLATE 4 MG PO TABS: 4.0000 mg | ORAL_TABLET | Freq: Every day | ORAL | 5 refills | Status: DC

## 2024-02-28 NOTE — Patient Instructions (Addendum)
 Medication Changes:  INCREASE CARDURA  TO 4MG  ONCE DAILY  RESTART TORSEMIDE  60MG  ONCE DAILY   Lab Work:  Labs done today, your results will be available in MyChart, we will contact you for abnormal readings.  Follow-Up in: 4 WEEKS AS SCHEDULED WITH APP CLINIC   At the [facility], you and your health needs are our priority. We have a designated team specialized in the treatment of Heart Failure. This Care Team includes your primary Heart Failure Specialized Cardiologist (physician), Advanced Practice Providers (APPs- Physician Assistants and Nurse Practitioners), and Pharmacist who all work together to provide you with the care you need, when you need it.   You may see any of the following providers on your designated Care Team at your next follow up:  Dr. Toribio Fuel Dr. Ezra Shuck Dr. Odis Brownie Greig Mosses, NP Caffie Shed, [place] [facility] [place], [place] Beckey Coe, NP Jordan Lee, NP Tinnie Redman, PharmD   Please be sure to bring in all your medications bottles to every appointment.   Need to Contact Us :  If you have any questions or concerns before your next appointment please send us  a message through [place] or call our office at [phone].    TO LEAVE A MESSAGE FOR THE NURSE SELECT OPTION 2, PLEASE LEAVE A MESSAGE INCLUDING: YOUR NAME DATE OF BIRTH CALL BACK NUMBER REASON FOR CALL**this is important as we prioritize the call backs  YOU WILL RECEIVE A CALL BACK THE SAME DAY AS LONG AS YOU CALL BEFORE 4:00 PM

## 2024-02-29 LAB — LDL CHOLESTEROL, DIRECT: Direct LDL: 143 mg/dL — ABNORMAL HIGH (ref 0–99)

## 2024-03-04 ENCOUNTER — Other Ambulatory Visit (HOSPITAL_COMMUNITY): Payer: Self-pay

## 2024-03-04 NOTE — Progress Notes (Unsigned)
 Paramedicine Encounter    Patient ID: Jennifer [name], female    DOB: 04-23-1990, 33 y.o.   MRN: [id number]   Complaints-denies  Edema-none  Compliance with meds-yes  Pill box filled-yes  If so, by whom-paramedic   Refills needed- Amitriptyline  Duloxetine   Isosorbide  Lansoprazole   Levocetirizine  Pregabalin  Torsemide   atorvastatin       Pt was seen in clinic last week. Her doxazosin  was increased to 4mg  daily.  She reports no meds today yet. She was sleeping a lot during the day and woke up late so behind sch taking meds.  She reports some wheezing when she lays down- every night.  She reports having to clear her throat cough but not a true cough.  She denies fevers. She has inhaler but not using it, so advised her to use it.  No c/p, no dizziness, she has had some palpitations. [name] taking all her meds, just behind sch today.  She reports her cardiomems is not working at all. Advised her to contact the company.  She reports its not transmitting.   CBG's-90s-150s  BP 138/72   Pulse (!) 104   Resp 16   Wt 151 lb (68.5 kg)   SpO2 98%   BMI 32.68 kg/m  Weight yesterday-151 Last visit weight-156  Patient Care Team: [name] as PCP - General (Physician Assistant) Tommas Pears, MD as PCP - Endocrinology (Endocrinology) Ladona Heinz, MD as PCP - Cardiology (Cardiology)  Patient Active Problem List   Diagnosis Date Noted  . Pure hypercholesterolemia 02/24/2024  . CKD (chronic kidney disease) stage 4, GFR 15-29 ml/min (HCC) 02/23/2024  . Hyperglycemia due to diabetes mellitus (HCC) 02/23/2024  . Pulmonary HTN (HCC) 02/23/2024  . NICM (nonischemic cardiomyopathy) (HCC) 02/23/2024  . Acute CHF (congestive heart failure) (HCC) 02/22/2024  . Skin infection 01/02/2024  . 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) (HCC) 12/28/2023  . Hypertensive urgency 12/28/2023  . Acute on chronic heart failure with normal  ejection fraction (HCC) 12/07/2023  . Controlled type 2 diabetes mellitus with hyperglycemia (HCC) 08/31/2023  . Nausea without vomiting 08/31/2023  . Epigastric pain 08/31/2023  . Sleep apnea 08/30/2023  . Abscess 08/30/2023  . SOB (shortness of breath) 08/20/2023  . Dizziness 08/20/2023  . Chronic pancreatitis (HCC) 08/15/2023  . Witnessed episode of apnea 06/27/2023  . Pseudohyponatremia 04/10/2023  . Metabolic acidosis 04/10/2023  . Hypertriglyceridemia 04/10/2023  . Peripheral neuropathy 04/10/2023  . Type 2 MI (myocardial infarction) (HCC) 01/19/2023  . Atypical chest pain 01/19/2023  . Coronary artery disease due to lipid rich plaque 01/18/2023  . Chest pain due to GERD 01/18/2023  . Enteritis 01/02/2023  . Syncope 01/02/2023  . LFT elevation 01/02/2023  . Bladder wall thickening 01/02/2023  . Hyperglycemia in setting of T2DM 09/19/2022  . Hyperbilirubinemia 09/07/2022  . UTI (urinary tract infection) due to Enterococcus 06/01/2022  . E. coli UTI (urinary tract infection) 06/01/2022  . DKA (diabetic ketoacidosis) (HCC) 06/01/2022  . ASCUS of cervix with negative high risk HPV 05/31/2022  . Acute on chronic pancreatitis (HCC) 05/29/2022  . Hypokalemia 03/23/2022  . COVID 03/15/2022  . COVID-19 virus infection 03/14/2022  . Hyponatremia 03/14/2022  . Positive D dimer 03/14/2022  . Stage 3b chronic kidney disease (CKD) (HCC) 03/14/2022  . Pulmonary hypertension (HCC) 02/06/2022  . NASH (nonalcoholic steatohepatitis) 02/06/2022  . Obesity (BMI 30-39.9) 02/06/2022  . Heart failure (HCC) 12/03/2021  . Acute on chronic diastolic CHF (congestive heart failure) (HCC)  12/02/2021  . Elevated troponin 12/02/2021  . Pancytopenia (HCC) 12/02/2021  . Amenorrhea 12/02/2021  . Hepatic steatosis 09/27/2021  . Hyperosmolar hyperglycemic state (HHS) (HCC) 08/26/2021  . Small intestinal bacterial overgrowth (SIBO) 08/26/2021  . Lower extremity edema 08/26/2021  . Hyperkalemia  08/14/2021  . Hx of insulin  dependent diabetes mellitus 08/14/2021  . CKD stage 3b, GFR 30-44 ml/min (HCC) 05/13/2021  . Gastroparesis due to DM (HCC) 05/12/2021  . Abdominal distension 05/12/2021  . Chest pain 03/10/2021  . Acute on chronic combined systolic and diastolic CHF (congestive heart failure) (HCC) 02/09/2021  . History of astrocytoma of brain 02/09/2021  . Type 2 diabetes mellitus with hyperglycemia, with long-term current use of insulin  (HCC) 01/25/2021  . Diarrhea   . Elevated transaminase level   . Orthostatic hypotension 11/28/2020  . Acute combined systolic and diastolic heart failure (HCC)   . Nonischemic cardiomyopathy (HCC)   . Acute systolic congestive heart failure (HCC)   . Elevated liver enzymes   . Acute renal failure superimposed on stage 3b chronic kidney disease (HCC) 11/26/2020  . Intractable abdominal pain 11/23/2020  . Chronic systolic congestive heart failure (HCC) 11/23/2020  . Primary hypertension 11/23/2020  . Familial hypertriglyceridemia 11/23/2020  . Prolonged QT interval 11/23/2020  . Finger mass, left 08/12/2020  . Nausea and vomiting 07/23/2020  . Polycystic ovary syndrome 01/30/2020  . Moderate aortic insufficiency 08/16/2019  . Moderate mitral regurgitation 08/16/2019  . Lipoprotein lipase deficiency, familial 06/19/2019  . Microalbuminuria due to type 1 diabetes mellitus (HCC) 06/19/2019  . Type 2 diabetes mellitus with hyperlipidemia (HCC) 10/10/2018  . Hypothyroidism 10/10/2018    Current Outpatient Medications:  .  albuterol  (VENTOLIN  HFA) 108 (90 Base) MCG/ACT inhaler, Inhale 2 puffs into the lungs daily as needed for wheezing or shortness of breath., Disp: , Rfl:  .  allopurinol  (ZYLOPRIM ) 100 MG tablet, Take 1 tablet (100 mg total) by mouth daily., Disp: 90 tablet, Rfl: 3 .  aspirin  EC 81 MG tablet, Take 1 tablet (81 mg total) by mouth daily. Swallow whole., Disp: 30 tablet, Rfl: 1 .  atorvastatin  (LIPITOR ) 80 MG tablet, Take 1  tablet (80 mg total) by mouth every evening., Disp: 30 tablet, Rfl: 0 .  calcitRIOL  (ROCALTROL ) 0.25 MCG capsule, Take 0.25 mcg by mouth daily., Disp: , Rfl:  .  doxazosin  (CARDURA ) 4 MG tablet, Take 1 tablet (4 mg total) by mouth daily., Disp: 30 tablet, Rfl: 5 .  DULoxetine  (CYMBALTA ) 60 MG capsule, Take 60 mg by mouth at bedtime., Disp: , Rfl:  .  ergocalciferol  (VITAMIN D2) 1.25 MG (50000 UT) capsule, Take 1 capsule (50,000 Units total) by mouth every Sunday., Disp: 4 capsule, Rfl: 0 .  hydrALAZINE  (APRESOLINE ) 100 MG tablet, Take 1 tablet (100 mg total) by mouth every 8 (eight) hours., Disp: 90 tablet, Rfl: 0 .  isosorbide  mononitrate (IMDUR ) 60 MG 24 hr tablet, Take 1 tablet (60 mg total) by mouth daily., Disp: 30 tablet, Rfl: 0 .  ivabradine  (CORLANOR ) 7.5 MG TABS tablet, Take 1 tablet (7.5 mg total) by mouth 2 (two) times daily with a meal., Disp: 60 tablet, Rfl: 11 .  lansoprazole  (PREVACID ) 30 MG capsule, TAKE 1 CAPSULE (30 MG TOTAL) BY MOUTH 2 (TWO) TIMES DAILY BEFORE A MEAL., Disp: 60 capsule, Rfl: 2 .  levocetirizine (XYZAL ) 5 MG tablet, Take 5 mg by mouth at bedtime., Disp: , Rfl:  .  levothyroxine  (SYNTHROID ) 150 MCG tablet, Take 150 mcg by mouth daily before breakfast., Disp: , Rfl:  .  pregabalin  (LYRICA ) 300 MG capsule, Take 300 mg by mouth at bedtime., Disp: , Rfl:  .  promethazine  (PHENERGAN ) 25 MG tablet, Take 25 mg by mouth at bedtime., Disp: , Rfl:  .  torsemide  (DEMADEX ) 20 MG tablet, Take 3 tablets (60 mg total) by mouth daily., Disp: 90 tablet, Rfl: 2 .  tretinoin  (RETIN-A ) 0.05 % cream, Apply 1 Application topically at bedtime., Disp: , Rfl:  .  amitriptyline  (ELAVIL ) 10 MG tablet, Take 10 mg by mouth. (Patient taking differently: Take 10 mg by mouth at bedtime.), Disp: , Rfl:  .  azelastine  (ASTELIN ) 0.1 % nasal spray, Place 2 sprays into both nostrils daily as needed for rhinitis or allergies., Disp: , Rfl:  .  BAQSIMI ONE PACK 3 MG/DOSE POWD, Place 1 Dose into the nose  once as needed (hypoglycemia)., Disp: , Rfl:  .  dicyclomine  (BENTYL ) 20 MG tablet, Take 1 tablet (20 mg total) by mouth 2 (two) times daily., Disp: 20 tablet, Rfl: 0 .  Glucose Blood (BLOOD GLUCOSE TEST STRIPS) STRP, 1 each by Does not apply route 3 (three) times daily. Use as directed to check blood sugar. May dispense any manufacturer covered by patient's insurance and fits patient's device., Disp: 100 strip, Rfl: 0 .  HUMALOG  KWIKPEN 100 UNIT/ML KwikPen, Inject 60 Units into the skin in the morning, at noon, and at bedtime., Disp: , Rfl:  .  HUMULIN  R U-500 KWIKPEN 500 UNIT/ML KwikPen, Inject 300 Units into the skin in the morning and at bedtime., Disp: , Rfl:  .  Insulin  Pen Needle (PEN NEEDLES) 31G X 5 MM MISC, Use as directed 3 (three) times daily., Disp: 100 each, Rfl: 0 .  Lancet Device MISC, 1 each by Does not apply route 3 (three) times daily. May dispense any manufacturer covered by patient's insurance., Disp: 1 each, Rfl: 0 .  Lancets MISC, 1 each by Does not apply route 3 (three) times daily. Use as directed to check blood sugar. May dispense any manufacturer covered by patient's insurance and fits patient's device., Disp: 100 each, Rfl: 0 .  methylPREDNISolone  (MEDROL  DOSEPAK) 4 MG TBPK tablet, Take as directed, Disp: 21 each, Rfl: 0 .  Safety Seal Miscellaneous MISC, Hormonic Hair Solution with minoxidil USP 7% and finasteride USP 0.05% - apply to affected areas daily every morning., Disp: 30 mL, Rfl: 2 .  SUMAtriptan  (IMITREX ) 100 MG tablet, Take 100 mg by mouth daily as needed., Disp: , Rfl:  Allergies  Allergen Reactions  . Icosapent  Ethyl (Epa Ethyl Ester) (Fish) Hives  . Ketamine Other (See Comments)    In a vegetative state for 15 minutes, per pt  . [place] Itching and Other (See Comments)    Itchy throat  . Morphine  Hives, Itching, Rash and Other (See Comments)    Sore throat, too  . Mushroom Other (See Comments)    Pt has never had mushrooms but tested positive on  allergy test  . Penicillins Hives, Itching and Rash    Tolerates cephalosporins  . Shellfish Allergy Other (See Comments)    Pt has never had shellfish, but tested positive on allergy test. Pt states contrast in CT is okay.  . Fentanyl  Nausea And Vomiting  . Prednisone Rash      Social History   Socioeconomic History  . Marital status: Divorced    Spouse name: Not on file  . Number of children: 0  . Years of education: Not on file  . Highest education level: Not on file  Occupational History  . Occupation: [employer]  . Occupation: N/A  Tobacco Use  . Smoking status: Never  . Smokeless tobacco: Never  Vaping Use  . Vaping status: Never Used  Substance and Sexual Activity  . Alcohol  use: Never  . Drug use: Never  . Sexual activity: Yes  Other Topics Concern  . Not on file  Social History Narrative  . Not on file   Social Drivers of Health   Financial Resource Strain: Medium Risk (01/22/2024)   Received from [facility]   Overall Financial Resource Strain (CARDIA)   . How hard is it for you to pay for the very basics like food, housing, medical care, and heating?: Somewhat hard  Food Insecurity: No Food Insecurity (02/23/2024)   Hunger Vital Sign   . Worried About Programme Researcher, Broadcasting/film/video in the Last Year: Never true   . Ran Out of Food in the Last Year: Never true  Recent Concern: Food Insecurity - Food Insecurity Present (01/22/2024)   Received from [facility]   Hunger Vital Sign   . Within the past 12 months, you worried that your food would run out before you got the money to buy more.: Sometimes true   . Within the past 12 months, the food you bought just didn't last and you didn't have money to get more.: Sometimes true  Transportation Needs: No Transportation Needs (02/23/2024)   PRAPARE - Transportation   . Lack of Transportation (Medical): No   . Lack of Transportation (Non-Medical): No  Recent Concern: Transportation Needs - Unmet Transportation Needs  (01/22/2024)   Received from [facility] - Transportation   . Lack of Transportation (Medical): Patient declined   . In the past 12 months, has lack of transportation kept you from meetings, work, or from getting things needed for daily living?: Yes  Physical Activity: Inactive (01/22/2024)   Received from [facility]   Exercise Vital Sign   . On average, how many days per week do you engage in moderate to strenuous exercise (like a brisk walk)?: 0 days   . Minutes of Exercise per Session: Not on file  Stress: No Stress Concern Present (01/22/2024)   Received from [facility] - Occupational Stress Questionnaire   . Do you feel stress - tense, restless, nervous, or anxious, or unable to sleep at night because your mind is troubled all the time - these days?: Not at all  Social Connections: Socially Integrated (01/22/2024)   Received from [facility]   Social Network   . How would you rate your social network (family, work, friends)?: Good participation with social networks  Intimate Partner Violence: Not At Risk (02/23/2024)   Humiliation, Afraid, Rape, and Kick questionnaire   . Fear of Current or Ex-Partner: No   . Emotionally Abused: No   . Physically Abused: No   . Sexually Abused: No    Physical Exam      Future Appointments  Date Time Provider Department Center  03/27/2024 11:30 AM [facility] PA/NP [facility] None  04/04/2024  2:30 PM [facility] PA/NP [facility] None  04/16/2024  8:00 PM Neda Jennet LABOR, MD [facility]  04/26/2024 11:00 AM Hope Almarie ORN, NP [facility] [address]  05/07/2024  3:15 PM Alm Jennifer SAILOR, DO [facility] None       Izetta Quivers, Paramedic [phone] [facility] Paramedic  03/04/24

## 2024-03-05 ENCOUNTER — Encounter: Payer: Self-pay | Admitting: Family

## 2024-03-11 ENCOUNTER — Other Ambulatory Visit (HOSPITAL_COMMUNITY): Payer: Self-pay

## 2024-03-11 NOTE — Progress Notes (Signed)
 Paramedicine Encounter    Patient ID: Jennifer [name], female    DOB: 20-Oct-1990, 33 y.o.   MRN: [id number]   Complaints-UTI and ear bleeding   Edema-none  Compliance with meds-yes  Pill box filled-yes  If so, by whom-paramedic   Refills needed-calcitrol  amitriptyline -needs in PM pill box in sat and sun pm  cymbalta  Isosorbide - needs in sun am dose    Lansoprazole   Levocetirizine-needs new rx- doc was faxed on that one pregabalin   --ordered these meds for delivery   Pt reports she has not been feeling well,  UTI symptoms and she has been having bleeding from the inside of her left ear only off and on for past several wks. [name] waiting to get appoint with provider ref these issues.  She did call [facility] tech support and they report no issue on their end. She did use it today. Looks like she is slightly above goal at 26 and it went thru ok. Pt reports her weight was 155 over the wknd and she took an extra torsemide  yesterday.  She also reports her CBG's have been much lower in the 70s at times up to 250. She has cut back on her insulin . She also has been taking some other supplements as well and her endo has started her on the monthly injection tryngolza.   Pt reports her breathing is doing ok. She has been able to lay down at night without the sob she was telling me about last time.  She denies c/p. She reports palpitations a lot after she takes the thyroid  meds and at night times too.  No dizziness. No edema noted, she does not feel bloated.   Appetite ok. Cutting back on fluids, but doing ok.   Meds verified and pill box refilled.    BP (!) 144/78   Pulse 85   Resp 16   Wt 151 lb (68.5 kg)   SpO2 98%   BMI 32.68 kg/m  Weight yesterday-155 Last visit weight-151  Patient Care Team: [name] as PCP - General (Physician Assistant) Tommas Pears, MD as PCP - Endocrinology (Endocrinology) Ladona Heinz, MD as PCP - Cardiology  (Cardiology)  Patient Active Problem List   Diagnosis Date Noted   Pure hypercholesterolemia 02/24/2024   CKD (chronic kidney disease) stage 4, GFR 15-29 ml/min (HCC) 02/23/2024   Hyperglycemia due to diabetes mellitus (HCC) 02/23/2024   Pulmonary HTN (HCC) 02/23/2024   NICM (nonischemic cardiomyopathy) (HCC) 02/23/2024   Acute CHF (congestive heart failure) (HCC) 02/22/2024   Skin infection 01/02/2024   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) (HCC) 12/28/2023   Hypertensive urgency 12/28/2023   Acute on chronic heart failure with normal ejection fraction (HCC) 12/07/2023   Controlled type 2 diabetes mellitus with hyperglycemia (HCC) 08/31/2023   Nausea without vomiting 08/31/2023   Epigastric pain 08/31/2023   Sleep apnea 08/30/2023   Abscess 08/30/2023   SOB (shortness of breath) 08/20/2023   Dizziness 08/20/2023   Chronic pancreatitis (HCC) 08/15/2023   Witnessed episode of apnea 06/27/2023   Pseudohyponatremia 04/10/2023   Metabolic acidosis 04/10/2023   Hypertriglyceridemia 04/10/2023   Peripheral neuropathy 04/10/2023   Type 2 MI (myocardial infarction) (HCC) 01/19/2023   Atypical chest pain 01/19/2023   Coronary artery disease due to lipid rich plaque 01/18/2023   Chest pain due to GERD 01/18/2023   Enteritis 01/02/2023   Syncope 01/02/2023   LFT elevation 01/02/2023   Bladder wall thickening 01/02/2023   Hyperglycemia  in setting of T2DM 09/19/2022   Hyperbilirubinemia 09/07/2022   UTI (urinary tract infection) due to Enterococcus 06/01/2022   E. coli UTI (urinary tract infection) 06/01/2022   DKA (diabetic ketoacidosis) (HCC) 06/01/2022   ASCUS of cervix with negative high risk HPV 05/31/2022   Acute on chronic pancreatitis (HCC) 05/29/2022   Hypokalemia 03/23/2022   COVID 03/15/2022   COVID-19 virus infection 03/14/2022   Hyponatremia 03/14/2022   Positive D dimer 03/14/2022   Stage 3b chronic kidney disease (CKD) (HCC)  03/14/2022   Pulmonary hypertension (HCC) 02/06/2022   NASH (nonalcoholic steatohepatitis) 02/06/2022   Obesity (BMI 30-39.9) 02/06/2022   Heart failure (HCC) 12/03/2021   Acute on chronic diastolic CHF (congestive heart failure) (HCC) 12/02/2021   Elevated troponin 12/02/2021   Pancytopenia (HCC) 12/02/2021   Amenorrhea 12/02/2021   Hepatic steatosis 09/27/2021   Hyperosmolar hyperglycemic state (HHS) (HCC) 08/26/2021   Small intestinal bacterial overgrowth (SIBO) 08/26/2021   Lower extremity edema 08/26/2021   Hyperkalemia 08/14/2021   Hx of insulin  dependent diabetes mellitus 08/14/2021   CKD stage 3b, GFR 30-44 ml/min (HCC) 05/13/2021   Gastroparesis due to DM (HCC) 05/12/2021   Abdominal distension 05/12/2021   Chest pain 03/10/2021   Acute on chronic combined systolic and diastolic CHF (congestive heart failure) (HCC) 02/09/2021   History of astrocytoma of brain 02/09/2021   Type 2 diabetes mellitus with hyperglycemia, with long-term current use of insulin  (HCC) 01/25/2021   Diarrhea    Elevated transaminase level    Orthostatic hypotension 11/28/2020   Acute combined systolic and diastolic heart failure (HCC)    Nonischemic cardiomyopathy (HCC)    Acute systolic congestive heart failure (HCC)    Elevated liver enzymes    Acute renal failure superimposed on stage 3b chronic kidney disease (HCC) 11/26/2020   Intractable abdominal pain 11/23/2020   Chronic systolic congestive heart failure (HCC) 11/23/2020   Primary hypertension 11/23/2020   Familial hypertriglyceridemia 11/23/2020   Prolonged QT interval 11/23/2020   Finger mass, left 08/12/2020   Nausea and vomiting 07/23/2020   Polycystic ovary syndrome 01/30/2020   Moderate aortic insufficiency 08/16/2019   Moderate mitral regurgitation 08/16/2019   Lipoprotein lipase deficiency, familial 06/19/2019   Microalbuminuria due to type 1 diabetes mellitus (HCC) 06/19/2019   Type 2 diabetes mellitus with hyperlipidemia  (HCC) 10/10/2018   Hypothyroidism 10/10/2018   Current Medications[1] Allergies[2]    Social History   Socioeconomic History   Marital status: Divorced    Spouse name: Not on file   Number of children: 0   Years of education: Not on file   Highest education level: Not on file  Occupational History   Occupation: Dance Movement Psychotherapist   Occupation: N/A  Tobacco Use   Smoking status: Never   Smokeless tobacco: Never  Vaping Use   Vaping status: Never Used  Substance and Sexual Activity   Alcohol  use: Never   Drug use: Never   Sexual activity: Yes  Other Topics Concern   Not on file  Social History Narrative   Not on file   Social Drivers of Health   Tobacco Use: Low Risk (02/28/2024)   Patient History    Smoking Tobacco Use: Never    Smokeless Tobacco Use: Never    Passive Exposure: Not on file  Financial Resource Strain: Medium Risk (01/22/2024)   Received from [facility]   Overall Financial Resource Strain (CARDIA)    How hard is it for you to pay for the very basics like food, housing, medical  care, and heating?: Somewhat hard  Food Insecurity: No Food Insecurity (02/23/2024)   Epic    Worried About Programme Researcher, Broadcasting/film/video in the Last Year: Never true    Ran Out of Food in the Last Year: Never true  Recent Concern: Food Insecurity - Food Insecurity Present (01/22/2024)   Received from [facility]   Epic    Within the past 12 months, you worried that your food would run out before you got the money to buy more.: Sometimes true    Within the past 12 months, the food you bought just didn't last and you didn't have money to get more.: Sometimes true  Transportation Needs: No Transportation Needs (02/23/2024)   Epic    Lack of Transportation (Medical): No    Lack of Transportation (Non-Medical): No  Recent Concern: Transportation Needs - Unmet Transportation Needs (01/22/2024)   Received from [facility]    Lack of Transportation (Medical): Patient declined     In the past 12 months, has lack of transportation kept you from meetings, work, or from getting things needed for daily living?: Yes  Physical Activity: Inactive (01/22/2024)   Received from [facility]   Exercise Vital Sign    On average, how many days per week do you engage in moderate to strenuous exercise (like a brisk walk)?: 0 days    Minutes of Exercise per Session: Not on file  Stress: No Stress Concern Present (01/22/2024)   Received from [facility] - Occupational Stress Questionnaire    Do you feel stress - tense, restless, nervous, or anxious, or unable to sleep at night because your mind is troubled all the time - these days?: Not at all  Social Connections: Socially Integrated (01/22/2024)   Received from [facility]   Social Network    How would you rate your social network (family, work, friends)?: Good participation with social networks  Intimate Partner Violence: Not At Risk (02/23/2024)   Epic    Fear of Current or Ex-Partner: No    Emotionally Abused: No    Physically Abused: No    Sexually Abused: No  Depression (PHQ2-9): Low Risk (12/10/2021)   Depression (PHQ2-9)    PHQ-2 Score: 0  Alcohol  Screen: Low Risk (12/10/2021)   Alcohol  Screen    Last Alcohol  Screening Score (AUDIT): 0  Housing: Low Risk (02/23/2024)   Epic    Unable to Pay for Housing in the Last Year: No    Number of Times Moved in the Last Year: 0    Homeless in the Last Year: No  Utilities: Not At Risk (02/23/2024)   Epic    Threatened with loss of utilities: No  Health Literacy: Not on file    Physical Exam      Future Appointments  Date Time Provider Department Center  03/27/2024 11:30 AM [facility] PA/NP [facility] None  04/04/2024  2:30 PM [facility] PA/NP [facility] None  04/16/2024  8:00 PM Neda Jennet LABOR, MD [facility]  04/26/2024 11:00 AM Hope Almarie ORN, NP [facility] [address]  05/07/2024  3:15 PM Alm Jennifer SAILOR, DO [facility]  None       Izetta Quivers, Paramedic [phone] [facility] Paramedic  03/11/2024     [1]  Current Outpatient Medications:    albuterol  (VENTOLIN  HFA) 108 (90 Base) MCG/ACT inhaler, Inhale 2 puffs into the lungs daily as needed for wheezing or shortness of breath., Disp: , Rfl:  allopurinol  (ZYLOPRIM ) 100 MG tablet, Take 1 tablet (100 mg total) by mouth daily., Disp: 90 tablet, Rfl: 3   aspirin  EC 81 MG tablet, Take 1 tablet (81 mg total) by mouth daily. Swallow whole., Disp: 30 tablet, Rfl: 1   atorvastatin  (LIPITOR ) 80 MG tablet, Take 1 tablet (80 mg total) by mouth every evening., Disp: 30 tablet, Rfl: 0   azelastine  (ASTELIN ) 0.1 % nasal spray, Place 2 sprays into both nostrils daily as needed for rhinitis or allergies., Disp: , Rfl:    Biotin 89999 MCG TABS, Take 1 tablet by mouth daily., Disp: , Rfl:    calcitRIOL  (ROCALTROL ) 0.25 MCG capsule, Take 0.25 mcg by mouth daily., Disp: , Rfl:    doxazosin  (CARDURA ) 4 MG tablet, Take 1 tablet (4 mg total) by mouth daily., Disp: 30 tablet, Rfl: 5   DULoxetine  (CYMBALTA ) 60 MG capsule, Take 60 mg by mouth at bedtime., Disp: , Rfl:    ergocalciferol  (VITAMIN D2) 1.25 MG (50000 UT) capsule, Take 1 capsule (50,000 Units total) by mouth every Sunday., Disp: 4 capsule, Rfl: 0   Glucose Blood (BLOOD GLUCOSE TEST STRIPS) STRP, 1 each by Does not apply route 3 (three) times daily. Use as directed to check blood sugar. May dispense any manufacturer covered by patient's insurance and fits patient's device., Disp: 100 strip, Rfl: 0   HUMALOG  KWIKPEN 100 UNIT/ML KwikPen, Inject 60 Units into the skin in the morning, at noon, and at bedtime. (Patient taking differently: Inject 60 Units into the skin in the morning, at noon, and at bedtime.), Disp: , Rfl:    HUMULIN  R U-500 KWIKPEN 500 UNIT/ML KwikPen, Inject 300 Units into the skin in the morning and at bedtime. (Patient taking differently: Inject 150 Units into the skin in the morning and at  bedtime.), Disp: , Rfl:    hydrALAZINE  (APRESOLINE ) 100 MG tablet, Take 1 tablet (100 mg total) by mouth every 8 (eight) hours., Disp: 90 tablet, Rfl: 0   Insulin  Pen Needle (PEN NEEDLES) 31G X 5 MM MISC, Use as directed 3 (three) times daily., Disp: 100 each, Rfl: 0   isosorbide  mononitrate (IMDUR ) 60 MG 24 hr tablet, Take 1 tablet (60 mg total) by mouth daily., Disp: 30 tablet, Rfl: 0   ivabradine  (CORLANOR ) 7.5 MG TABS tablet, Take 1 tablet (7.5 mg total) by mouth 2 (two) times daily with a meal., Disp: 60 tablet, Rfl: 11   lansoprazole  (PREVACID ) 30 MG capsule, TAKE 1 CAPSULE (30 MG TOTAL) BY MOUTH 2 (TWO) TIMES DAILY BEFORE A MEAL., Disp: 60 capsule, Rfl: 2   levocetirizine (XYZAL ) 5 MG tablet, Take 5 mg by mouth at bedtime., Disp: , Rfl:    levothyroxine  (SYNTHROID ) 150 MCG tablet, Take 150 mcg by mouth daily before breakfast., Disp: , Rfl:    [place] Sodium (TRYNGOLZA) 80 MG/0.8ML SOAJ, Inject 1 Units into the skin every 30 (thirty) days., Disp: , Rfl:    pregabalin  (LYRICA ) 300 MG capsule, Take 300 mg by mouth at bedtime., Disp: , Rfl:    promethazine  (PHENERGAN ) 25 MG tablet, Take 25 mg by mouth at bedtime., Disp: , Rfl:    torsemide  (DEMADEX ) 20 MG tablet, Take 3 tablets (60 mg total) by mouth daily., Disp: 90 tablet, Rfl: 2   tretinoin  (RETIN-A ) 0.05 % cream, Apply 1 Application topically at bedtime., Disp: , Rfl:    amitriptyline  (ELAVIL ) 10 MG tablet, Take 10 mg by mouth. (Patient taking differently: Take 10 mg by mouth at bedtime.), Disp: , Rfl:  BAQSIMI ONE PACK 3 MG/DOSE POWD, Place 1 Dose into the nose once as needed (hypoglycemia)., Disp: , Rfl:    dicyclomine  (BENTYL ) 20 MG tablet, Take 1 tablet (20 mg total) by mouth 2 (two) times daily., Disp: 20 tablet, Rfl: 0   Lancet Device MISC, 1 each by Does not apply route 3 (three) times daily. May dispense any manufacturer covered by patient's insurance., Disp: 1 each, Rfl: 0   Lancets MISC, 1 each by Does not apply route 3 (three)  times daily. Use as directed to check blood sugar. May dispense any manufacturer covered by patient's insurance and fits patient's device., Disp: 100 each, Rfl: 0   methylPREDNISolone  (MEDROL  DOSEPAK) 4 MG TBPK tablet, Take as directed, Disp: 21 each, Rfl: 0   Safety Seal Miscellaneous MISC, Hormonic Hair Solution with minoxidil USP 7% and finasteride USP 0.05% - apply to affected areas daily every morning., Disp: 30 mL, Rfl: 2   SUMAtriptan  (IMITREX ) 100 MG tablet, Take 100 mg by mouth daily as needed., Disp: , Rfl:  [2]  Allergies Allergen Reactions   Icosapent  Ethyl (Epa Ethyl Ester) (Fish) Hives   Ketamine Other (See Comments)    In a vegetative state for 15 minutes, per pt   Maitake Itching and Other (See Comments)    Itchy throat   Morphine  Hives, Itching, Rash and Other (See Comments)    Sore throat, too   Mushroom Other (See Comments)    Pt has never had mushrooms but tested positive on allergy test   Penicillins Hives, Itching and Rash    Tolerates cephalosporins   Shellfish Allergy Other (See Comments)    Pt has never had shellfish, but tested positive on allergy test. Pt states contrast in CT is okay.   Fentanyl  Nausea And Vomiting   Prednisone Rash

## 2024-03-18 ENCOUNTER — Other Ambulatory Visit (HOSPITAL_COMMUNITY): Payer: Self-pay

## 2024-03-18 NOTE — Progress Notes (Signed)
 Paramedicine Encounter    Patient ID: Jennifer [name], female    DOB: 08-17-1990, 33 y.o.   MRN: [id number]   Complaints-none   Edema-bloated  Compliance with meds-yes   Pill box filled-yes  If so, by whom-paramedic   Refills needed-amitriptyline - needs in pm -called pharm to order that-will be delivered tomor     Pt reports she is doing ok. Her weight is up 4lbs from last visit. She reports it started going up a few days ago.  Per instructions from clinic she can take extra 20mg  of torsemide . So she will do that now. She denies c/p, no palpitations, no dizziness. Pt reports good urine output.  UTI symptoms still there. She reports she is suppose to take augmentin  but its at the pharmacy, so I called and that will be sent out along with the amitriptyline . She will need to add that to the pm dose.  Her ear issues has resolved. She has not used her cardiomems in a few days, the last 2 on 17th and 18th and it was suspect readings. So I advised her to get back on the pillow daily.  She does appear to be bloated.  States she only drinks when she takes her pills however she had just popped open a tall can of protein drink when I arrived, but said her mouth was dry and that's why she opened that.  There is also pizza hut boxes on stove, so not sure how many of those if any were taken by her but cautioned her on eating salty foods and drinking too much fluids.   Meds verified and pill box refilled.  She was getting out the 20mg  tab of torsemide  when I was about to leave.    Cbgs-302 pta  BP (!) 160/80   Pulse 94   Resp 16   Wt 155 lb (70.3 kg)   SpO2 99%   BMI 33.54 kg/m  Weight yesterday-155 Last visit weight-151  Patient Care Team: [name] as PCP - General (Physician Assistant) Tommas Pears, MD as PCP - Endocrinology (Endocrinology) Ladona Heinz, MD as PCP - Cardiology (Cardiology)  Patient Active Problem List   Diagnosis Date Noted   Pure  hypercholesterolemia 02/24/2024   CKD (chronic kidney disease) stage 4, GFR 15-29 ml/min (HCC) 02/23/2024   Hyperglycemia due to diabetes mellitus (HCC) 02/23/2024   Pulmonary HTN (HCC) 02/23/2024   NICM (nonischemic cardiomyopathy) (HCC) 02/23/2024   Acute CHF (congestive heart failure) (HCC) 02/22/2024   Skin infection 01/02/2024   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) (HCC) 12/28/2023   Hypertensive urgency 12/28/2023   Acute on chronic heart failure with normal ejection fraction (HCC) 12/07/2023   Controlled type 2 diabetes mellitus with hyperglycemia (HCC) 08/31/2023   Nausea without vomiting 08/31/2023   Epigastric pain 08/31/2023   Sleep apnea 08/30/2023   Abscess 08/30/2023   SOB (shortness of breath) 08/20/2023   Dizziness 08/20/2023   Chronic pancreatitis (HCC) 08/15/2023   Witnessed episode of apnea 06/27/2023   Pseudohyponatremia 04/10/2023   Metabolic acidosis 04/10/2023   Hypertriglyceridemia 04/10/2023   Peripheral neuropathy 04/10/2023   Type 2 MI (myocardial infarction) (HCC) 01/19/2023   Atypical chest pain 01/19/2023   Coronary artery disease due to lipid rich plaque 01/18/2023   Chest pain due to GERD 01/18/2023   Enteritis 01/02/2023   Syncope 01/02/2023   LFT elevation 01/02/2023   Bladder wall thickening 01/02/2023   Hyperglycemia in setting of T2DM 09/19/2022  Hyperbilirubinemia 09/07/2022   UTI (urinary tract infection) due to Enterococcus 06/01/2022   E. coli UTI (urinary tract infection) 06/01/2022   DKA (diabetic ketoacidosis) (HCC) 06/01/2022   ASCUS of cervix with negative high risk HPV 05/31/2022   Acute on chronic pancreatitis (HCC) 05/29/2022   Hypokalemia 03/23/2022   COVID 03/15/2022   COVID-19 virus infection 03/14/2022   Hyponatremia 03/14/2022   Positive D dimer 03/14/2022   Stage 3b chronic kidney disease (CKD) (HCC) 03/14/2022   Pulmonary hypertension (HCC) 02/06/2022   NASH (nonalcoholic  steatohepatitis) 02/06/2022   Obesity (BMI 30-39.9) 02/06/2022   Heart failure (HCC) 12/03/2021   Acute on chronic diastolic CHF (congestive heart failure) (HCC) 12/02/2021   Elevated troponin 12/02/2021   Pancytopenia (HCC) 12/02/2021   Amenorrhea 12/02/2021   Hepatic steatosis 09/27/2021   Hyperosmolar hyperglycemic state (HHS) (HCC) 08/26/2021   Small intestinal bacterial overgrowth (SIBO) 08/26/2021   Lower extremity edema 08/26/2021   Hyperkalemia 08/14/2021   Hx of insulin  dependent diabetes mellitus 08/14/2021   CKD stage 3b, GFR 30-44 ml/min (HCC) 05/13/2021   Gastroparesis due to DM (HCC) 05/12/2021   Abdominal distension 05/12/2021   Chest pain 03/10/2021   Acute on chronic combined systolic and diastolic CHF (congestive heart failure) (HCC) 02/09/2021   History of astrocytoma of brain 02/09/2021   Type 2 diabetes mellitus with hyperglycemia, with long-term current use of insulin  (HCC) 01/25/2021   Diarrhea    Elevated transaminase level    Orthostatic hypotension 11/28/2020   Acute combined systolic and diastolic heart failure (HCC)    Nonischemic cardiomyopathy (HCC)    Acute systolic congestive heart failure (HCC)    Elevated liver enzymes    Acute renal failure superimposed on stage 3b chronic kidney disease (HCC) 11/26/2020   Intractable abdominal pain 11/23/2020   Chronic systolic congestive heart failure (HCC) 11/23/2020   Primary hypertension 11/23/2020   Familial hypertriglyceridemia 11/23/2020   Prolonged QT interval 11/23/2020   Finger mass, left 08/12/2020   Nausea and vomiting 07/23/2020   Polycystic ovary syndrome 01/30/2020   Moderate aortic insufficiency 08/16/2019   Moderate mitral regurgitation 08/16/2019   Lipoprotein lipase deficiency, familial 06/19/2019   Microalbuminuria due to type 1 diabetes mellitus (HCC) 06/19/2019   Type 2 diabetes mellitus with hyperlipidemia (HCC) 10/10/2018   Hypothyroidism 10/10/2018   Current  Medications[1] Allergies[2]    Social History   Socioeconomic History   Marital status: Divorced    Spouse name: Not on file   Number of children: 0   Years of education: Not on file   Highest education level: Not on file  Occupational History   Occupation: Dance Movement Psychotherapist   Occupation: N/A  Tobacco Use   Smoking status: Never   Smokeless tobacco: Never  Vaping Use   Vaping status: Never Used  Substance and Sexual Activity   Alcohol  use: Never   Drug use: Never   Sexual activity: Yes  Other Topics Concern   Not on file  Social History Narrative   Not on file   Social Drivers of Health   Tobacco Use: Low Risk (03/12/2024)   Received from [facility]   Patient History    Smoking Tobacco Use: Never    Smokeless Tobacco Use: Never    Passive Exposure: Never  Financial Resource Strain: Medium Risk (01/22/2024)   Received from [facility]   Overall Financial Resource Strain (CARDIA)    How hard is it for you to pay for the very basics like food, housing, medical care, and heating?:  Somewhat hard  Food Insecurity: No Food Insecurity (02/23/2024)   Epic    Worried About Programme Researcher, Broadcasting/film/video in the Last Year: Never true    Ran Out of Food in the Last Year: Never true  Recent Concern: Food Insecurity - Food Insecurity Present (01/22/2024)   Received from [facility]   Epic    Within the past 12 months, you worried that your food would run out before you got the money to buy more.: Sometimes true    Within the past 12 months, the food you bought just didn't last and you didn't have money to get more.: Sometimes true  Transportation Needs: No Transportation Needs (02/23/2024)   Epic    Lack of Transportation (Medical): No    Lack of Transportation (Non-Medical): No  Recent Concern: Transportation Needs - Unmet Transportation Needs (01/22/2024)   Received from [facility]    Lack of Transportation (Medical): Patient declined    In the past 12 months, has lack  of transportation kept you from meetings, work, or from getting things needed for daily living?: Yes  Physical Activity: Inactive (01/22/2024)   Received from [facility]   Exercise Vital Sign    On average, how many days per week do you engage in moderate to strenuous exercise (like a brisk walk)?: 0 days    Minutes of Exercise per Session: Not on file  Stress: No Stress Concern Present (01/22/2024)   Received from [facility] - Occupational Stress Questionnaire    Do you feel stress - tense, restless, nervous, or anxious, or unable to sleep at night because your mind is troubled all the time - these days?: Not at all  Social Connections: Socially Integrated (01/22/2024)   Received from [facility]   Social Network    How would you rate your social network (family, work, friends)?: Good participation with social networks  Intimate Partner Violence: Not At Risk (02/23/2024)   Epic    Fear of Current or Ex-Partner: No    Emotionally Abused: No    Physically Abused: No    Sexually Abused: No  Depression (PHQ2-9): Low Risk (12/10/2021)   Depression (PHQ2-9)    PHQ-2 Score: 0  Alcohol  Screen: Low Risk (12/10/2021)   Alcohol  Screen    Last Alcohol  Screening Score (AUDIT): 0  Housing: Low Risk (02/23/2024)   Epic    Unable to Pay for Housing in the Last Year: No    Number of Times Moved in the Last Year: 0    Homeless in the Last Year: No  Utilities: Not At Risk (02/23/2024)   Epic    Threatened with loss of utilities: No  Health Literacy: Not on file    Physical Exam      Future Appointments  Date Time Provider Department Center  03/27/2024 11:30 AM [facility] PA/NP [facility] None  04/04/2024  2:30 PM [facility] PA/NP [facility] None  04/16/2024  8:00 PM Neda Jennet LABOR, MD [facility]  04/26/2024 11:00 AM Hope Almarie ORN, NP [facility] [address]  05/07/2024  3:15 PM Alm Jennifer SAILOR, DO [facility] None       Izetta Quivers,  Paramedic [phone] [facility] Paramedic  03/18/2024     [1]  Current Outpatient Medications:    allopurinol  (ZYLOPRIM ) 100 MG tablet, Take 1 tablet (100 mg total) by mouth daily., Disp: 90 tablet, Rfl: 3   aspirin  EC 81 MG tablet, Take 1 tablet (81 mg  total) by mouth daily. Swallow whole., Disp: 30 tablet, Rfl: 1   atorvastatin  (LIPITOR ) 80 MG tablet, Take 1 tablet (80 mg total) by mouth every evening., Disp: 30 tablet, Rfl: 0   Biotin 10000 MCG TABS, Take 1 tablet by mouth daily., Disp: , Rfl:    calcitRIOL  (ROCALTROL ) 0.25 MCG capsule, Take 0.25 mcg by mouth daily., Disp: , Rfl:    doxazosin  (CARDURA ) 4 MG tablet, Take 1 tablet (4 mg total) by mouth daily., Disp: 30 tablet, Rfl: 5   DULoxetine  (CYMBALTA ) 60 MG capsule, Take 60 mg by mouth at bedtime., Disp: , Rfl:    hydrALAZINE  (APRESOLINE ) 100 MG tablet, Take 1 tablet (100 mg total) by mouth every 8 (eight) hours., Disp: 90 tablet, Rfl: 0   isosorbide  mononitrate (IMDUR ) 60 MG 24 hr tablet, Take 1 tablet (60 mg total) by mouth daily., Disp: 30 tablet, Rfl: 0   ivabradine  (CORLANOR ) 7.5 MG TABS tablet, Take 1 tablet (7.5 mg total) by mouth 2 (two) times daily with a meal., Disp: 60 tablet, Rfl: 11   lansoprazole  (PREVACID ) 30 MG capsule, TAKE 1 CAPSULE (30 MG TOTAL) BY MOUTH 2 (TWO) TIMES DAILY BEFORE A MEAL., Disp: 60 capsule, Rfl: 2   levocetirizine (XYZAL ) 5 MG tablet, Take 5 mg by mouth at bedtime., Disp: , Rfl:    levothyroxine  (SYNTHROID ) 150 MCG tablet, Take 150 mcg by mouth daily before breakfast., Disp: , Rfl:    pregabalin  (LYRICA ) 300 MG capsule, Take 300 mg by mouth at bedtime., Disp: , Rfl:    promethazine  (PHENERGAN ) 25 MG tablet, Take 25 mg by mouth at bedtime., Disp: , Rfl:    torsemide  (DEMADEX ) 20 MG tablet, Take 3 tablets (60 mg total) by mouth daily., Disp: 90 tablet, Rfl: 2   albuterol  (VENTOLIN  HFA) 108 (90 Base) MCG/ACT inhaler, Inhale 2 puffs into the lungs daily as needed for wheezing or shortness of  breath., Disp: , Rfl:    amitriptyline  (ELAVIL ) 10 MG tablet, Take 10 mg by mouth. (Patient taking differently: Take 10 mg by mouth at bedtime.), Disp: , Rfl:    azelastine  (ASTELIN ) 0.1 % nasal spray, Place 2 sprays into both nostrils daily as needed for rhinitis or allergies., Disp: , Rfl:    BAQSIMI ONE PACK 3 MG/DOSE POWD, Place 1 Dose into the nose once as needed (hypoglycemia)., Disp: , Rfl:    dicyclomine  (BENTYL ) 20 MG tablet, Take 1 tablet (20 mg total) by mouth 2 (two) times daily., Disp: 20 tablet, Rfl: 0   ergocalciferol  (VITAMIN D2) 1.25 MG (50000 UT) capsule, Take 1 capsule (50,000 Units total) by mouth every Sunday., Disp: 4 capsule, Rfl: 0   Glucose Blood (BLOOD GLUCOSE TEST STRIPS) STRP, 1 each by Does not apply route 3 (three) times daily. Use as directed to check blood sugar. May dispense any manufacturer covered by patient's insurance and fits patient's device., Disp: 100 strip, Rfl: 0   HUMALOG  KWIKPEN 100 UNIT/ML KwikPen, Inject 60 Units into the skin in the morning, at noon, and at bedtime. (Patient taking differently: Inject 60 Units into the skin in the morning, at noon, and at bedtime.), Disp: , Rfl:    HUMULIN  R U-500 KWIKPEN 500 UNIT/ML KwikPen, Inject 300 Units into the skin in the morning and at bedtime. (Patient taking differently: Inject 150 Units into the skin in the morning and at bedtime.), Disp: , Rfl:    Insulin  Pen Needle (PEN NEEDLES) 31G X 5 MM MISC, Use as directed 3 (three) times daily., Disp:  100 each, Rfl: 0   Lancet Device MISC, 1 each by Does not apply route 3 (three) times daily. May dispense any manufacturer covered by patient's insurance., Disp: 1 each, Rfl: 0   Lancets MISC, 1 each by Does not apply route 3 (three) times daily. Use as directed to check blood sugar. May dispense any manufacturer covered by patient's insurance and fits patient's device., Disp: 100 each, Rfl: 0   methylPREDNISolone  (MEDROL  DOSEPAK) 4 MG TBPK tablet, Take as directed, Disp:  21 each, Rfl: 0   Olezarsen Sodium (TRYNGOLZA) 80 MG/0.8ML SOAJ, Inject 1 Units into the skin every 30 (thirty) days., Disp: , Rfl:    Safety Seal Miscellaneous MISC, Hormonic Hair Solution with minoxidil USP 7% and finasteride USP 0.05% - apply to affected areas daily every morning., Disp: 30 mL, Rfl: 2   SUMAtriptan  (IMITREX ) 100 MG tablet, Take 100 mg by mouth daily as needed., Disp: , Rfl:    tretinoin  (RETIN-A ) 0.05 % cream, Apply 1 Application topically at bedtime., Disp: , Rfl:  [2]  Allergies Allergen Reactions   Icosapent  Ethyl (Epa Ethyl Ester) (Fish) Hives   Ketamine Other (See Comments)    In a vegetative state for 15 minutes, per pt   Maitake Itching and Other (See Comments)    Itchy throat   Morphine  Hives, Itching, Rash and Other (See Comments)    Sore throat, too   Mushroom Other (See Comments)    Pt has never had mushrooms but tested positive on allergy test   Penicillins Hives, Itching and Rash    Tolerates cephalosporins   Shellfish Allergy Other (See Comments)    Pt has never had shellfish, but tested positive on allergy test. Pt states contrast in CT is okay.   Fentanyl  Nausea And Vomiting   Prednisone Rash

## 2024-03-25 ENCOUNTER — Other Ambulatory Visit (HOSPITAL_COMMUNITY): Payer: Self-pay

## 2024-03-25 NOTE — Progress Notes (Addendum)
 Paramedicine Encounter    Patient ID: Jennifer [name], female    DOB: [birth year], 33 y.o.   MRN: [id number]   Complaints-denies   Edema-bloating, but less than yesterday   Compliance with meds-yes but missed a few doses of hydralazine    Pill box filled-yes If so, by whom-paramedic   Refills needed-vitamin d  -she is suppose to get her insulin  tomor so asked pharm to send her vit d with it  And her levocetirizine      Pt report she is doing ok , she missed a few doses of her afternoon hydralazine  since last week.  She has a burst vessel in her eyeball area- she doesn't know what happened or anything.   She denies c/p, her breathing is doing ok.she gets palpitations at times at night. No readings showing in quite some time.  [name] going to call about her cardiomems not working properly today.  She has started the amoxicillin  for UTI symptoms and its improving.  She reports her CBG's were better but she ate pasta over the christmas holiday and that makes her sugars go up for a long period of time so she is still working on that.  She has endo virtual appoint today.  She missed the hydralazine  dose last night, and the afternoon dose yesterday but took her morning dose at 830 this morning.   She goes to clinic on Wednesday. She feels the need to take an additional torsemide  today as well as she still feels quite bloated.    Meds verified and pill box refilled.    BP (!) 180/90   Pulse 94   Resp 16   Wt 153 lb (69.4 kg)   SpO2 97%   BMI 33.11 kg/m  Weight yesterday-155--she took extra torsemide   Last visit weight-155  Patient Care Team: [name] as PCP - General (Physician Assistant) Tommas Pears, MD as PCP - Endocrinology (Endocrinology) Ladona Heinz, MD as PCP - Cardiology (Cardiology)  Patient Active Problem List   Diagnosis Date Noted   Pure hypercholesterolemia 02/24/2024   CKD (chronic kidney disease) stage 4, GFR 15-29 ml/min (HCC) 02/23/2024    Hyperglycemia due to diabetes mellitus (HCC) 02/23/2024   Pulmonary HTN (HCC) 02/23/2024   NICM (nonischemic cardiomyopathy) (HCC) 02/23/2024   Acute CHF (congestive heart failure) (HCC) 02/22/2024   Skin infection 01/02/2024   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) (HCC) 12/28/2023   Hypertensive urgency 12/28/2023   Acute on chronic heart failure with normal ejection fraction (HCC) 12/07/2023   Controlled type 2 diabetes mellitus with hyperglycemia (HCC) 08/31/2023   Nausea without vomiting 08/31/2023   Epigastric pain 08/31/2023   Sleep apnea 08/30/2023   Abscess 08/30/2023   SOB (shortness of breath) 08/20/2023   Dizziness 08/20/2023   Chronic pancreatitis (HCC) 08/15/2023   Witnessed episode of apnea 06/27/2023   Pseudohyponatremia 04/10/2023   Metabolic acidosis 04/10/2023   Hypertriglyceridemia 04/10/2023   Peripheral neuropathy 04/10/2023   Type 2 MI (myocardial infarction) (HCC) 01/19/2023   Atypical chest pain 01/19/2023   Coronary artery disease due to lipid rich plaque 01/18/2023   Chest pain due to GERD 01/18/2023   Enteritis 01/02/2023   Syncope 01/02/2023   LFT elevation 01/02/2023   Bladder wall thickening 01/02/2023   Hyperglycemia in setting of T2DM 09/19/2022   Hyperbilirubinemia 09/07/2022   UTI (urinary tract infection) due to Enterococcus 06/01/2022   E. coli UTI (urinary tract infection) 06/01/2022   DKA (diabetic ketoacidosis) (HCC) 06/01/2022  ASCUS of cervix with negative high risk HPV 05/31/2022   Acute on chronic pancreatitis (HCC) 05/29/2022   Hypokalemia 03/23/2022   COVID 03/15/2022   COVID-19 virus infection 03/14/2022   Hyponatremia 03/14/2022   Positive D dimer 03/14/2022   Stage 3b chronic kidney disease (CKD) (HCC) 03/14/2022   Pulmonary hypertension (HCC) 02/06/2022   NASH (nonalcoholic steatohepatitis) 02/06/2022   Obesity (BMI 30-39.9) 02/06/2022   Heart failure (HCC) 12/03/2021   Acute on  chronic diastolic CHF (congestive heart failure) (HCC) 12/02/2021   Elevated troponin 12/02/2021   Pancytopenia (HCC) 12/02/2021   Amenorrhea 12/02/2021   Hepatic steatosis 09/27/2021   Hyperosmolar hyperglycemic state (HHS) (HCC) 08/26/2021   Small intestinal bacterial overgrowth (SIBO) 08/26/2021   Lower extremity edema 08/26/2021   Hyperkalemia 08/14/2021   Hx of insulin  dependent diabetes mellitus 08/14/2021   CKD stage 3b, GFR 30-44 ml/min (HCC) 05/13/2021   Gastroparesis due to DM (HCC) 05/12/2021   Abdominal distension 05/12/2021   Chest pain 03/10/2021   Acute on chronic combined systolic and diastolic CHF (congestive heart failure) (HCC) 02/09/2021   History of astrocytoma of brain 02/09/2021   Type 2 diabetes mellitus with hyperglycemia, with long-term current use of insulin  (HCC) 01/25/2021   Diarrhea    Elevated transaminase level    Orthostatic hypotension 11/28/2020   Acute combined systolic and diastolic heart failure (HCC)    Nonischemic cardiomyopathy (HCC)    Acute systolic congestive heart failure (HCC)    Elevated liver enzymes    Acute renal failure superimposed on stage 3b chronic kidney disease (HCC) 11/26/2020   Intractable abdominal pain 11/23/2020   Chronic systolic congestive heart failure (HCC) 11/23/2020   Primary hypertension 11/23/2020   Familial hypertriglyceridemia 11/23/2020   Prolonged QT interval 11/23/2020   Finger mass, left 08/12/2020   Nausea and vomiting 07/23/2020   Polycystic ovary syndrome 01/30/2020   Moderate aortic insufficiency 08/16/2019   Moderate mitral regurgitation 08/16/2019   Lipoprotein lipase deficiency, familial 06/19/2019   Microalbuminuria due to type 1 diabetes mellitus (HCC) 06/19/2019   Type 2 diabetes mellitus with hyperlipidemia (HCC) 10/10/2018   Hypothyroidism 10/10/2018   Current Medications[1] Allergies[2]    Social History   Socioeconomic History   Marital status: Divorced    Spouse name: Not on  file   Number of children: 0   Years of education: Not on file   Highest education level: Not on file  Occupational History   Occupation: Dance Movement Psychotherapist   Occupation: N/A  Tobacco Use   Smoking status: Never   Smokeless tobacco: Never  Vaping Use   Vaping status: Never Used  Substance and Sexual Activity   Alcohol  use: Never   Drug use: Never   Sexual activity: Yes  Other Topics Concern   Not on file  Social History Narrative   Not on file   Social Drivers of Health   Tobacco Use: Low Risk (03/12/2024)   Received from [facility]   Patient History    Smoking Tobacco Use: Never    Smokeless Tobacco Use: Never    Passive Exposure: Never  Financial Resource Strain: Medium Risk (01/22/2024)   Received from [facility]   Overall Financial Resource Strain (CARDIA)    How hard is it for you to pay for the very basics like food, housing, medical care, and heating?: Somewhat hard  Food Insecurity: No Food Insecurity (02/23/2024)   Epic    Worried About Radiation Protection Practitioner of Food in the Last Year: Never true  Ran Out of Food in the Last Year: Never true  Recent Concern: Food Insecurity - Food Insecurity Present (01/22/2024)   Received from [facility]   Epic    Within the past 12 months, you worried that your food would run out before you got the money to buy more.: Sometimes true    Within the past 12 months, the food you bought just didn't last and you didn't have money to get more.: Sometimes true  Transportation Needs: No Transportation Needs (02/23/2024)   Epic    Lack of Transportation (Medical): No    Lack of Transportation (Non-Medical): No  Recent Concern: Transportation Needs - Unmet Transportation Needs (01/22/2024)   Received from [facility]    Lack of Transportation (Medical): Patient declined    In the past 12 months, has lack of transportation kept you from meetings, work, or from getting things needed for daily living?: Yes  Physical Activity:  Inactive (01/22/2024)   Received from [facility]   Exercise Vital Sign    On average, how many days per week do you engage in moderate to strenuous exercise (like a brisk walk)?: 0 days    Minutes of Exercise per Session: Not on file  Stress: No Stress Concern Present (01/22/2024)   Received from [facility] - Occupational Stress Questionnaire    Do you feel stress - tense, restless, nervous, or anxious, or unable to sleep at night because your mind is troubled all the time - these days?: Not at all  Social Connections: Socially Integrated (01/22/2024)   Received from [facility]   Social Network    How would you rate your social network (family, work, friends)?: Good participation with social networks  Intimate Partner Violence: Not At Risk (02/23/2024)   Epic    Fear of Current or Ex-Partner: No    Emotionally Abused: No    Physically Abused: No    Sexually Abused: No  Depression (PHQ2-9): Low Risk (12/10/2021)   Depression (PHQ2-9)    PHQ-2 Score: 0  Alcohol  Screen: Low Risk (12/10/2021)   Alcohol  Screen    Last Alcohol  Screening Score (AUDIT): 0  Housing: Low Risk (02/23/2024)   Epic    Unable to Pay for Housing in the Last Year: No    Number of Times Moved in the Last Year: 0    Homeless in the Last Year: No  Utilities: Not At Risk (02/23/2024)   Epic    Threatened with loss of utilities: No  Health Literacy: Not on file    Physical Exam      Future Appointments  Date Time Provider Department Center  03/27/2024 11:30 AM [facility] PA/NP [facility] None  04/26/2024 11:00 AM Hope Almarie ORN, NP [facility] [address]  05/07/2024  3:15 PM Alm Jennifer SAILOR, DO [facility] None       Izetta Quivers, Paramedic [phone] [facility] Paramedic  03/25/2024     [1]  Current Outpatient Medications:    allopurinol  (ZYLOPRIM ) 100 MG tablet, Take 1 tablet (100 mg total) by mouth daily., Disp: 90 tablet, Rfl: 3    aspirin  EC 81 MG tablet, Take 1 tablet (81 mg total) by mouth daily. Swallow whole., Disp: 30 tablet, Rfl: 1   atorvastatin  (LIPITOR ) 80 MG tablet, Take 1 tablet (80 mg total) by mouth every evening., Disp: 30 tablet, Rfl: 0   Biotin 10000 MCG TABS, Take 1 tablet by mouth daily., Disp: , Rfl:  calcitRIOL  (ROCALTROL ) 0.25 MCG capsule, Take 0.25 mcg by mouth daily., Disp: , Rfl:    doxazosin  (CARDURA ) 4 MG tablet, Take 1 tablet (4 mg total) by mouth daily., Disp: 30 tablet, Rfl: 5   DULoxetine  (CYMBALTA ) 60 MG capsule, Take 60 mg by mouth at bedtime., Disp: , Rfl:    ergocalciferol  (VITAMIN D2) 1.25 MG (50000 UT) capsule, Take 1 capsule (50,000 Units total) by mouth every Sunday., Disp: 4 capsule, Rfl: 0   hydrALAZINE  (APRESOLINE ) 100 MG tablet, Take 1 tablet (100 mg total) by mouth every 8 (eight) hours., Disp: 90 tablet, Rfl: 0   isosorbide  mononitrate (IMDUR ) 60 MG 24 hr tablet, Take 1 tablet (60 mg total) by mouth daily., Disp: 30 tablet, Rfl: 0   ivabradine  (CORLANOR ) 7.5 MG TABS tablet, Take 1 tablet (7.5 mg total) by mouth 2 (two) times daily with a meal., Disp: 60 tablet, Rfl: 11   lansoprazole  (PREVACID ) 30 MG capsule, TAKE 1 CAPSULE (30 MG TOTAL) BY MOUTH 2 (TWO) TIMES DAILY BEFORE A MEAL., Disp: 60 capsule, Rfl: 2   levocetirizine (XYZAL ) 5 MG tablet, Take 5 mg by mouth at bedtime., Disp: , Rfl:    levothyroxine  (SYNTHROID ) 150 MCG tablet, Take 150 mcg by mouth daily before breakfast., Disp: , Rfl:    [place] Sodium (TRYNGOLZA) 80 MG/0.8ML SOAJ, Inject 1 Units into the skin every 30 (thirty) days., Disp: , Rfl:    pregabalin  (LYRICA ) 300 MG capsule, Take 300 mg by mouth at bedtime., Disp: , Rfl:    promethazine  (PHENERGAN ) 25 MG tablet, Take 25 mg by mouth at bedtime., Disp: , Rfl:    torsemide  (DEMADEX ) 20 MG tablet, Take 3 tablets (60 mg total) by mouth daily., Disp: 90 tablet, Rfl: 2   tretinoin  (RETIN-A ) 0.05 % cream, Apply 1 Application topically at bedtime., Disp: , Rfl:     albuterol  (VENTOLIN  HFA) 108 (90 Base) MCG/ACT inhaler, Inhale 2 puffs into the lungs daily as needed for wheezing or shortness of breath., Disp: , Rfl:    amitriptyline  (ELAVIL ) 10 MG tablet, Take 10 mg by mouth. (Patient taking differently: Take 10 mg by mouth at bedtime.), Disp: , Rfl:    azelastine  (ASTELIN ) 0.1 % nasal spray, Place 2 sprays into both nostrils daily as needed for rhinitis or allergies., Disp: , Rfl:    BAQSIMI ONE PACK 3 MG/DOSE POWD, Place 1 Dose into the nose once as needed (hypoglycemia)., Disp: , Rfl:    dicyclomine  (BENTYL ) 20 MG tablet, Take 1 tablet (20 mg total) by mouth 2 (two) times daily., Disp: 20 tablet, Rfl: 0   Glucose Blood (BLOOD GLUCOSE TEST STRIPS) STRP, 1 each by Does not apply route 3 (three) times daily. Use as directed to check blood sugar. May dispense any manufacturer covered by patient's insurance and fits patient's device., Disp: 100 strip, Rfl: 0   HUMALOG  KWIKPEN 100 UNIT/ML KwikPen, Inject 60 Units into the skin in the morning, at noon, and at bedtime. (Patient taking differently: Inject 60 Units into the skin in the morning, at noon, and at bedtime.), Disp: , Rfl:    HUMULIN  R U-500 KWIKPEN 500 UNIT/ML KwikPen, Inject 300 Units into the skin in the morning and at bedtime. (Patient taking differently: Inject 150 Units into the skin in the morning and at bedtime.), Disp: , Rfl:    Insulin  Pen Needle (PEN NEEDLES) 31G X 5 MM MISC, Use as directed 3 (three) times daily., Disp: 100 each, Rfl: 0   Lancet Device MISC, 1 each by  Does not apply route 3 (three) times daily. May dispense any manufacturer covered by patient's insurance., Disp: 1 each, Rfl: 0   Lancets MISC, 1 each by Does not apply route 3 (three) times daily. Use as directed to check blood sugar. May dispense any manufacturer covered by patient's insurance and fits patient's device., Disp: 100 each, Rfl: 0   methylPREDNISolone  (MEDROL  DOSEPAK) 4 MG TBPK tablet, Take as directed (Patient not taking:  Reported on 03/25/2024), Disp: 21 each, Rfl: 0   Safety Seal Miscellaneous MISC, Hormonic Hair Solution with minoxidil USP 7% and finasteride USP 0.05% - apply to affected areas daily every morning., Disp: 30 mL, Rfl: 2   SUMAtriptan  (IMITREX ) 100 MG tablet, Take 100 mg by mouth daily as needed. (Patient not taking: Reported on 03/25/2024), Disp: , Rfl:  [2]  Allergies Allergen Reactions   Icosapent  Ethyl (Epa Ethyl Ester) (Fish) Hives   Ketamine Other (See Comments)    In a vegetative state for 15 minutes, per pt   Maitake Itching and Other (See Comments)    Itchy throat   Morphine  Hives, Itching, Rash and Other (See Comments)    Sore throat, too   Mushroom Other (See Comments)    Pt has never had mushrooms but tested positive on allergy test   Penicillins Hives, Itching and Rash    Tolerates cephalosporins   Shellfish Allergy Other (See Comments)    Pt has never had shellfish, but tested positive on allergy test. Pt states contrast in CT is okay.   Fentanyl  Nausea And Vomiting   Prednisone Rash

## 2024-03-26 ENCOUNTER — Telehealth (HOSPITAL_COMMUNITY): Payer: Self-pay

## 2024-03-26 NOTE — Telephone Encounter (Signed)
 Called to confirm/remind patient of [facility]\.   Appointment:   [] Confirmed  [x] Left mess   [] No answer/No voice mail  [] VM Full/unable to leave message  [] Phone not in service  And to bring in all medications and/or complete list.

## 2024-03-26 NOTE — Progress Notes (Incomplete)
 "   Advanced Heart Failure Clinic Note   PCP: Emilio Joesph DEL, PA-C Primary Cardiologist: [name], MD HF Cardiologist: Dr. [name]    HPI: Jennifer [name] is a 33 y.o. 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   Has been followed by Dr. Bergamo for her HF.    Admitted 8/22 with vasculitis (no pancreatitis). Her TG were found to be 4030. Echo 11/28/20 EF 20-25% with moderate RV dysfunction and mild septal flattening. Started on DBA 2.5. cMRI c/w prior myocarditis vs sarcoid (see below). Mod pericardial effusion also noted. LVEF 29%. RV normal. She underwent R/LHC showing single vessel RCA occlusion, RA 1, LVEDP 17, Fick CO 5.6/CI 3.88. She was able to be weaned off inotropes and GDMT titrated.     Post hospital follow up she was hypervolemic, ReDs 43%. She followed up with Dr. Fredrica with [facility] 9/22 for a 2nd opinion. He placed LifeVest and switched beta blocker to Toprol  XL.   Echo 10/22: EF up to 45-50%, grade II DD, RV ok. LifeVest off.   Treated in ED 10/22 for pancreatitis, then admitted 11/22 for hyperglycemia.   Echo 12/22 EF 45%, RV normal.    Follow up 5/23, weight up 16 lbs, ReDs 27%. Arranged for RHC to better assess hemodynamics and see if abdominal symptoms related to right-sided HF due to high-output state or non-cardiac. This showed elevated filling pressures and normal cardiac ouput. Mild to moderate mixed PH with prominent v-waves in RA, suggestive of significant TR. Given IV lasix  and metolazone  2.5 added weekly to diuretic regimen.    Admitted 5/23 with AKI and hyperkalemia. Given IVF, insulin , calcium  gluconate and Lokelma . Echo showed EF 45-50%, RV normal, mild-mod MR, RVSP 30. Only mild TR. Restarted on torsemide  40 bid (lower dose) and Entresto  49/51 bid. Arranged paramedicine,  discharged home 143 lbs.   Seen 7/23 at [facility] for 2nd opinion, felt to be congested and recommended increasing torsemide  to 60 mg bid and instructed f/u in 3 months.   Received Furoscix  8/4, 8/5, and 10/31/21.  Admitted 9/23 for volume overload in setting of RV failure and AKI. Required milrinone . Underwent Cardiomems implant. RHC showed RA 15, PA 37/23 (28), PCWP 16, Fick CO/CI 4.2/2.6, Thermo CO/CI 3.7/2.3, PVR 4.3, PA sat 51%, 54%, PAPi 0.93  Previously being followed by Dr. Kimble at [facility]. Had renal biopsy 10/23 which showed DM and HTN nephropathy. They discussed starting HD soon. Now being followed by [facility].   Echo 10/04/22 EF 55% RV ok. Mild AI   Continued to have multiple hospitalizations and ED visits for hyperglycemia, hyperTG (>5,000), uncontrolled HTN, and abdominal pain.   Admitted 9/25 for a/c CHF in setting of poor med compliance. Echo EF 45-50%, RV mildly reduced. She was diuresed w/ IV Lasix  gtt and metolazone , sCr bumped to >3.0. Underwent RHC which showed RA 6, PA 46/23 (mean 31), PCWP 13, FICK CI 1.8, TPG 18, PVR 6.4 WU, PAPi 3.8, severely elevated SVR 2857. Significant respiratory variation, but cardiomems correlates well with RHC numbers. Of note it was recommended, when evaluating cardiomems, would look at the PA tracings and use the end expiratory values when looking at [facility]. GDMT titrated and she was re-enrolled in paramedicine. Discharged home, weight 139 lbs.  Admitted 12/28/23, sent from AHF clinic, for a/c dHF. She was diuresed with IV lasix  and discharged home, weight 145  lbs.  Readmitted last week with acute on chronic CHF. Weight was up 10-15 lb. She was diuresed with IV lasix  and had bump in Scr likely d/t overdiuresis. At discharge instructed to hold Torsemide  until today, then to restart at 60 mg daily (prior home dose).  [name] here today for post hospital CHF follow-up. Notes intermittent wheezing, had some last night but not today. No lower extremity edema,  abdomen always feels distended. Has been weighing 151-152 lb at home, about 5 lb above prior dry weight. Activity limited by R thigh pain and R foot pain from gout. Has been changing her diet d/t recent issues with gout. Reports adherence with medications. However, blood pressure has been markedly elevated.  Cardiomems 17 on 11/26 (goal 24). Reports not working.  ROS: All systems reviewed and negative except as per HPI.   Past Medical History:  Diagnosis Date   Afib (HCC) 05/12/2021   Brain tumor (HCC) 03/29/1995   astrocytoma   CHF (congestive heart failure) (HCC)    Cholesterosis    CKD (chronic kidney disease) stage 4, GFR 15-29 ml/min (HCC) 05/13/2021   DM (diabetes mellitus) (HCC) 10/10/2018   Fatty liver    Gastroparesis due to DM (HCC) 05/12/2021   HTN (hypertension) 10/10/2018   Hypothyroidism 10/10/2018   Lipoprotein deficiency    Lung disease    longevity long term   Pancreatitis    Polycystic ovary syndrome    Current Outpatient Medications  Medication Sig Dispense Refill   albuterol  (VENTOLIN  HFA) 108 (90 Base) MCG/ACT inhaler Inhale 2 puffs into the lungs daily as needed for wheezing or shortness of breath.     allopurinol  (ZYLOPRIM ) 100 MG tablet Take 1 tablet (100 mg total) by mouth daily. 90 tablet 3   amitriptyline  (ELAVIL ) 10 MG tablet Take 10 mg by mouth. (Patient taking differently: Take 10 mg by mouth at bedtime.)     aspirin  EC 81 MG tablet Take 1 tablet (81 mg total) by mouth daily. Swallow whole. 30 tablet 1   atorvastatin  (LIPITOR ) 80 MG tablet Take 1 tablet (80 mg total) by mouth every evening. 30 tablet 0   azelastine  (ASTELIN ) 0.1 % nasal spray Place 2 sprays into both nostrils daily as needed for rhinitis or allergies.     BAQSIMI ONE PACK 3 MG/DOSE POWD Place 1 Dose into the nose once as needed (hypoglycemia).     Biotin 89999 MCG TABS Take 1 tablet by mouth daily.     calcitRIOL  (ROCALTROL ) 0.25 MCG capsule Take 0.25 mcg by mouth daily.      dicyclomine  (BENTYL ) 20 MG tablet Take 1 tablet (20 mg total) by mouth 2 (two) times daily. 20 tablet 0   doxazosin  (CARDURA ) 4 MG tablet Take 1 tablet (4 mg total) by mouth daily. 30 tablet 5   DULoxetine  (CYMBALTA ) 60 MG capsule Take 60 mg by mouth at bedtime.     ergocalciferol  (VITAMIN D2) 1.25 MG (50000 UT) capsule Take 1 capsule (50,000 Units total) by mouth every Sunday. 4 capsule 0   Glucose Blood (BLOOD GLUCOSE TEST STRIPS) STRP 1 each by Does not apply route 3 (three) times daily. Use as directed to check blood sugar. May dispense any manufacturer covered by patient's insurance and fits patient's device. 100 strip 0   HUMALOG  KWIKPEN 100 UNIT/ML KwikPen Inject 60 Units into the skin in the morning, at noon, and at bedtime. (Patient taking differently: Inject 60 Units into the skin in the morning, at noon, and at bedtime.)  HUMULIN  R U-500 KWIKPEN 500 UNIT/ML KwikPen Inject 300 Units into the skin in the morning and at bedtime. (Patient taking differently: Inject 150 Units into the skin in the morning and at bedtime.)     hydrALAZINE  (APRESOLINE ) 100 MG tablet Take 1 tablet (100 mg total) by mouth every 8 (eight) hours. 90 tablet 0   Insulin  Pen Needle (PEN NEEDLES) 31G X 5 MM MISC Use as directed 3 (three) times daily. 100 each 0   isosorbide  mononitrate (IMDUR ) 60 MG 24 hr tablet Take 1 tablet (60 mg total) by mouth daily. 30 tablet 0   ivabradine  (CORLANOR ) 7.5 MG TABS tablet Take 1 tablet (7.5 mg total) by mouth 2 (two) times daily with a meal. 60 tablet 11   Lancet Device MISC 1 each by Does not apply route 3 (three) times daily. May dispense any manufacturer covered by patient's insurance. 1 each 0   Lancets MISC 1 each by Does not apply route 3 (three) times daily. Use as directed to check blood sugar. May dispense any manufacturer covered by patient's insurance and fits patient's device. 100 each 0   lansoprazole  (PREVACID ) 30 MG capsule TAKE 1 CAPSULE (30 MG TOTAL) BY MOUTH 2 (TWO)  TIMES DAILY BEFORE A MEAL. 60 capsule 2   levocetirizine (XYZAL ) 5 MG tablet Take 5 mg by mouth at bedtime.     levothyroxine  (SYNTHROID ) 150 MCG tablet Take 150 mcg by mouth daily before breakfast.     methylPREDNISolone  (MEDROL  DOSEPAK) 4 MG TBPK tablet Take as directed (Patient not taking: Reported on 03/25/2024) 21 each 0   Olezarsen Sodium (TRYNGOLZA) 80 MG/0.8ML SOAJ Inject 1 Units into the skin every 30 (thirty) days.     pregabalin  (LYRICA ) 300 MG capsule Take 300 mg by mouth at bedtime.     promethazine  (PHENERGAN ) 25 MG tablet Take 25 mg by mouth at bedtime.     Safety Seal Miscellaneous MISC Hormonic Hair Solution with minoxidil USP 7% and finasteride USP 0.05% - apply to affected areas daily every morning. 30 mL 2   SUMAtriptan  (IMITREX ) 100 MG tablet Take 100 mg by mouth daily as needed. (Patient not taking: Reported on 03/25/2024)     torsemide  (DEMADEX ) 20 MG tablet Take 3 tablets (60 mg total) by mouth daily. 90 tablet 2   tretinoin  (RETIN-A ) 0.05 % cream Apply 1 Application topically at bedtime.     No current facility-administered medications for this visit.   Allergies  Allergen Reactions   Icosapent  Ethyl (Epa Ethyl Ester) (Fish) Hives   Ketamine Other (See Comments)    In a vegetative state for 15 minutes, per pt   Maitake Itching and Other (See Comments)    Itchy throat   Morphine  Hives, Itching, Rash and Other (See Comments)    Sore throat, too   Mushroom Other (See Comments)    Pt has never had mushrooms but tested positive on allergy test   Penicillins Hives, Itching and Rash    Tolerates cephalosporins   Shellfish Allergy Other (See Comments)    Pt has never had shellfish, but tested positive on allergy test. Pt states contrast in CT is okay.   Fentanyl  Nausea And Vomiting   Prednisone Rash   Social History   Socioeconomic History   Marital status: Divorced    Spouse name: Not on file   Number of children: 0   Years of education: Not on file    Highest education level: Not on file  Occupational History   Occupation:  [employer]   Occupation: N/A  Tobacco Use   Smoking status: Never   Smokeless tobacco: Never  Vaping Use   Vaping status: Never Used  Substance and Sexual Activity   Alcohol  use: Never   Drug use: Never   Sexual activity: Yes  Other Topics Concern   Not on file  Social History Narrative   Not on file   Social Drivers of Health   Tobacco Use: Low Risk  (03/25/2024)   Received from [facility]   Patient History    Smoking Tobacco Use: Never    Smokeless Tobacco Use: Never    Passive Exposure: Never  Financial Resource Strain: Medium Risk (01/22/2024)   Received from [facility]   Overall Financial Resource Strain (CARDIA)    How hard is it for you to pay for the very basics like food, housing, medical care, and heating?: Somewhat hard  Food Insecurity: No Food Insecurity (02/23/2024)   Epic    Worried About Programme Researcher, Broadcasting/film/video in the Last Year: Never true    Ran Out of Food in the Last Year: Never true  Recent Concern: Food Insecurity - Food Insecurity Present (01/22/2024)   Received from [facility]   Epic    Within the past 12 months, you worried that your food would run out before you got the money to buy more.: Sometimes true    Within the past 12 months, the food you bought just didn't last and you didn't have money to get more.: Sometimes true  Transportation Needs: No Transportation Needs (02/23/2024)   Epic    Lack of Transportation (Medical): No    Lack of Transportation (Non-Medical): No  Recent Concern: Transportation Needs - Unmet Transportation Needs (01/22/2024)   Received from [facility]    Lack of Transportation (Medical): Patient declined    In the past 12 months, has lack of transportation kept you from meetings, work, or from getting things needed for daily living?: Yes  Physical Activity: Inactive (01/22/2024)   Received from [facility]    Exercise Vital Sign    On average, how many days per week do you engage in moderate to strenuous exercise (like a brisk walk)?: 0 days    Minutes of Exercise per Session: Not on file  Stress: No Stress Concern Present (01/22/2024)   Received from [facility] - Occupational Stress Questionnaire    Do you feel stress - tense, restless, nervous, or anxious, or unable to sleep at night because your mind is troubled all the time - these days?: Not at all  Social Connections: Socially Integrated (01/22/2024)   Received from [facility]   Social Network    How would you rate your social network (family, work, friends)?: Good participation with social networks  Intimate Partner Violence: Not At Risk (02/23/2024)   Epic    Fear of Current or Ex-Partner: No    Emotionally Abused: No    Physically Abused: No    Sexually Abused: No  Depression (PHQ2-9): Low Risk (12/10/2021)   Depression (PHQ2-9)    PHQ-2 Score: 0  Alcohol  Screen: Low Risk (12/10/2021)   Alcohol  Screen    Last Alcohol  Screening Score (AUDIT): 0  Housing: Low Risk (02/23/2024)   Epic    Unable to Pay for Housing in the Last Year: No    Number of Times Moved in the Last Year: 0    Homeless in the Last  Year: No  Utilities: Not At Risk (02/23/2024)   Epic    Threatened with loss of utilities: No  Health Literacy: Not on file   Family History  Problem Relation Age of Onset   Diabetes Mother    Hypertension Mother    Hyperlipidemia Mother    Thyroid  disease Mother    Hypertension Father    Diabetes Father    Pancreatic cancer Paternal Aunt    Pancreatic cancer Paternal Uncle    Colon cancer Neg Hx    Esophageal cancer Neg Hx    Stomach cancer Neg Hx    Wt Readings from Last 3 Encounters:  03/25/24 69.4 kg (153 lb)  03/18/24 70.3 kg (155 lb)  03/11/24 68.5 kg (151 lb)   There were no vitals taken for this visit.  Physical Exam  General:  No distress. Ambulated into  clinic. Cor: Regular rate & rhythm, tachy. No murmurs. Lungs: clear Abdomen: obese, soft, nontender, nondistended. Extremities: no edema Neuro: alert & orientedx3. Affect pleasant    ECG (personally reviewed): n/a  Cardiomems (personally reviewed): PAD 17 on 11/26, PAD goal 24  ReDs reading: n/a  ASSESSMENT & PLAN: Nonischemic cardiomyopathy, recovered EF, previously with HFrEF (EF 29%) - prev reduced EF to 29%.  - Echo 9/25: EF 45-50%, RV mildly reduced - RHC 9/25: RA 6, PA 46/23 (mean 31), PCWP 13, FICK CI 1.8, TPG 18, PVR 6.4 WU, PAPi 3.8, severely elevated SVR 2857 - NYHA III. Volume difficult by exam, does not appear significantly overloaded. Has not been able to check Cardiomems for about a week. Will provide updated number to contact company rep. Restart Torsemide  60 mg daily.  - GDMT limited by CKD - Continue hydralazine  100 mg tid + Imdur  60 mg daily - Continue ivabradine  7.5 mg bid. - No beta blocker with low CI on RHC - Labs today - Appreciate paramedicine  2. CAD - Single vessel RCA occlusion (small co-dominant vessel) on cardiac cath 09/08. - No s/s angina - Continue medical management. On aspirin  + statin  3.  CKD IV - Scr baseline variable with frequent AKIs  - Previously followed by Dr. Kimble (Nephrology at [facility]) - Had renal biopsy (10/23) and showed DM and HTN nephropathy.  - Now followed by [facility]. Reports she will be completing HD teaching soon. - Baseline SCr now ~ 3 - Labs today   4. Pulmonary hypertension - Mild to moderate on RHC 5/23. PVR 3.7 WU. - RHC 9/23: RA 15, PA 37/23 (28), PCWP 16,  Thermo CO/CI 3.7/2.3, PVR 4.3, PA sat 51%, 54%, PAPi 0.93  - RHC 9/25 RA 6, PA 46/23 (mean 31), PCWP 13, FICK CI 1.8, TPG 18, PVR 6.4 WU, PAPi 3.8, severely elevated SVR 2857 - Echo 9/25, RV mildly reduced  - V/Q negative - Off sildenafil  d/t dizziness and low BP - No change   5. TR - Prominent v-waves in RA tracing on RHC 5/23. - prev stable mild/mod -  Trivial on echo 9/23  - Mild on echo 9/25  6. Uncontrolled HTN - BP uncontrolled - Concern about adherence with meds but she states she is taking them.  - Continue above meds - Increase Cardura  to 4 mg daily   7. OSA - Unable to tolerate CPAP mask - Follows with [facility]  8. Severe hyperTG - Due to LPL deficiency w/ recent pancreatitis. - Follows in [facility] (Dr. Mona)  - Continue atorvastatin , off fenofibrate  d/t GFR  9. Type 3c DM - 2/2  chronic pancreatitis  - Follows with [facility] - On insulin   10. Chronic abdominal swelling/bloating - Seen at [facility] (11/20) for IBS, per chart review. - Had penumatosis intestinalis. She was started on pancreatic enzyme replacement. - Has DM gastroparesis - Suspect primarily fatty liver - no change  11. Poor Med Compliance/SDOH - Doing better with paramedicine back on board - Appreciate their assistance   Follow-up: 3-4 weeks with APP   Jennifer CHRISTELLA Gainer, FNP  4:38 PM  "

## 2024-03-27 ENCOUNTER — Ambulatory Visit (HOSPITAL_COMMUNITY)

## 2024-03-29 ENCOUNTER — Observation Stay (HOSPITAL_BASED_OUTPATIENT_CLINIC_OR_DEPARTMENT_OTHER)
Admission: EM | Admit: 2024-03-29 | Discharge: 2024-04-01 | Disposition: A | Attending: Internal Medicine | Admitting: Internal Medicine

## 2024-03-29 ENCOUNTER — Other Ambulatory Visit: Payer: Self-pay

## 2024-03-29 ENCOUNTER — Encounter (HOSPITAL_BASED_OUTPATIENT_CLINIC_OR_DEPARTMENT_OTHER): Payer: Self-pay

## 2024-03-29 ENCOUNTER — Emergency Department (HOSPITAL_BASED_OUTPATIENT_CLINIC_OR_DEPARTMENT_OTHER): Admitting: Radiology

## 2024-03-29 DIAGNOSIS — Z6833 Body mass index (BMI) 33.0-33.9, adult: Secondary | ICD-10-CM | POA: Insufficient documentation

## 2024-03-29 DIAGNOSIS — I272 Pulmonary hypertension, unspecified: Secondary | ICD-10-CM | POA: Diagnosis present

## 2024-03-29 DIAGNOSIS — R945 Abnormal results of liver function studies: Secondary | ICD-10-CM | POA: Diagnosis not present

## 2024-03-29 DIAGNOSIS — I130 Hypertensive heart and chronic kidney disease with heart failure and stage 1 through stage 4 chronic kidney disease, or unspecified chronic kidney disease: Secondary | ICD-10-CM | POA: Diagnosis not present

## 2024-03-29 DIAGNOSIS — R7989 Other specified abnormal findings of blood chemistry: Principal | ICD-10-CM

## 2024-03-29 DIAGNOSIS — E1122 Type 2 diabetes mellitus with diabetic chronic kidney disease: Secondary | ICD-10-CM | POA: Insufficient documentation

## 2024-03-29 DIAGNOSIS — E786 Lipoprotein deficiency: Secondary | ICD-10-CM | POA: Diagnosis present

## 2024-03-29 DIAGNOSIS — Z79899 Other long term (current) drug therapy: Secondary | ICD-10-CM | POA: Diagnosis not present

## 2024-03-29 DIAGNOSIS — I5043 Acute on chronic combined systolic (congestive) and diastolic (congestive) heart failure: Principal | ICD-10-CM | POA: Diagnosis present

## 2024-03-29 DIAGNOSIS — E66811 Obesity, class 1: Secondary | ICD-10-CM | POA: Diagnosis not present

## 2024-03-29 DIAGNOSIS — I10 Essential (primary) hypertension: Secondary | ICD-10-CM | POA: Diagnosis present

## 2024-03-29 DIAGNOSIS — R748 Abnormal levels of other serum enzymes: Secondary | ICD-10-CM | POA: Diagnosis present

## 2024-03-29 DIAGNOSIS — N898 Other specified noninflammatory disorders of vagina: Secondary | ICD-10-CM | POA: Diagnosis not present

## 2024-03-29 DIAGNOSIS — Z86011 Personal history of benign neoplasm of the brain: Secondary | ICD-10-CM | POA: Insufficient documentation

## 2024-03-29 DIAGNOSIS — Z794 Long term (current) use of insulin: Secondary | ICD-10-CM | POA: Insufficient documentation

## 2024-03-29 DIAGNOSIS — E8770 Fluid overload, unspecified: Secondary | ICD-10-CM

## 2024-03-29 DIAGNOSIS — I5032 Chronic diastolic (congestive) heart failure: Secondary | ICD-10-CM

## 2024-03-29 DIAGNOSIS — E783 Hyperchylomicronemia: Secondary | ICD-10-CM | POA: Diagnosis not present

## 2024-03-29 DIAGNOSIS — N184 Chronic kidney disease, stage 4 (severe): Secondary | ICD-10-CM | POA: Diagnosis not present

## 2024-03-29 DIAGNOSIS — Z7982 Long term (current) use of aspirin: Secondary | ICD-10-CM | POA: Diagnosis not present

## 2024-03-29 DIAGNOSIS — E1165 Type 2 diabetes mellitus with hyperglycemia: Secondary | ICD-10-CM | POA: Insufficient documentation

## 2024-03-29 DIAGNOSIS — E790 Hyperuricemia without signs of inflammatory arthritis and tophaceous disease: Secondary | ICD-10-CM

## 2024-03-29 DIAGNOSIS — Z765 Malingerer [conscious simulation]: Secondary | ICD-10-CM

## 2024-03-29 DIAGNOSIS — R739 Hyperglycemia, unspecified: Secondary | ICD-10-CM | POA: Diagnosis present

## 2024-03-29 DIAGNOSIS — R140 Abdominal distension (gaseous): Secondary | ICD-10-CM | POA: Diagnosis present

## 2024-03-29 LAB — URINALYSIS, ROUTINE W REFLEX MICROSCOPIC
Bilirubin Urine: NEGATIVE
Glucose, UA: NEGATIVE mg/dL
Hgb urine dipstick: NEGATIVE
Ketones, ur: NEGATIVE mg/dL
Leukocytes,Ua: NEGATIVE
Nitrite: NEGATIVE
Protein, ur: NEGATIVE mg/dL
Specific Gravity, Urine: 1.02 (ref 1.005–1.030)
pH: 5.5 (ref 5.0–8.0)

## 2024-03-29 LAB — CBG MONITORING, ED: Glucose-Capillary: 138 mg/dL — ABNORMAL HIGH (ref 70–99)

## 2024-03-29 LAB — BASIC METABOLIC PANEL WITH GFR
Anion gap: 20 — ABNORMAL HIGH (ref 5–15)
BUN: 76 mg/dL — ABNORMAL HIGH (ref 6–20)
CO2: 22 mmol/L (ref 22–32)
Calcium: 9.4 mg/dL (ref 8.9–10.3)
Chloride: 98 mmol/L (ref 98–111)
Creatinine, Ser: 2.76 mg/dL — ABNORMAL HIGH (ref 0.44–1.00)
GFR, Estimated: 23 mL/min — ABNORMAL LOW
Glucose, Bld: 185 mg/dL — ABNORMAL HIGH (ref 70–99)
Potassium: 3.9 mmol/L (ref 3.5–5.1)
Sodium: 139 mmol/L (ref 135–145)

## 2024-03-29 LAB — CBC
HCT: 28.9 % — ABNORMAL LOW (ref 36.0–46.0)
Hemoglobin: 9.6 g/dL — ABNORMAL LOW (ref 12.0–15.0)
MCH: 30.7 pg (ref 26.0–34.0)
MCHC: 33.2 g/dL (ref 30.0–36.0)
MCV: 92.3 fL (ref 80.0–100.0)
Platelets: 111 K/uL — ABNORMAL LOW (ref 150–400)
RBC: 3.13 MIL/uL — ABNORMAL LOW (ref 3.87–5.11)
RDW: 16.7 % — ABNORMAL HIGH (ref 11.5–15.5)
WBC: 7.9 K/uL (ref 4.0–10.5)
nRBC: 0 % (ref 0.0–0.2)

## 2024-03-29 LAB — PRO BRAIN NATRIURETIC PEPTIDE: Pro Brain Natriuretic Peptide: 414 pg/mL — ABNORMAL HIGH

## 2024-03-29 LAB — TROPONIN T, HIGH SENSITIVITY: Troponin T High Sensitivity: 105 ng/L (ref 0–19)

## 2024-03-29 MED ORDER — FUROSEMIDE 10 MG/ML IJ SOLN
INTRAMUSCULAR | Status: AC
  Filled 2024-03-29: qty 12

## 2024-03-29 MED ORDER — ACETAMINOPHEN 325 MG PO TABS
650.0000 mg | ORAL_TABLET | Freq: Once | ORAL | Status: AC
  Administered 2024-03-29: 650 mg via ORAL
  Filled 2024-03-29: qty 2

## 2024-03-29 MED ORDER — HYDROMORPHONE HCL 1 MG/ML IJ SOLN
0.5000 mg | Freq: Once | INTRAMUSCULAR | Status: AC
  Administered 2024-03-30: 0.5 mg via INTRAVENOUS
  Filled 2024-03-29: qty 1

## 2024-03-29 MED ORDER — FUROSEMIDE 10 MG/ML IJ SOLN
120.0000 mg | Freq: Once | INTRAVENOUS | Status: AC
  Administered 2024-03-29: 120 mg via INTRAVENOUS
  Filled 2024-03-29: qty 12

## 2024-03-29 MED ORDER — METOCLOPRAMIDE HCL 5 MG/ML IJ SOLN
10.0000 mg | Freq: Once | INTRAMUSCULAR | Status: AC
  Administered 2024-03-30: 10 mg via INTRAVENOUS
  Filled 2024-03-29: qty 2

## 2024-03-29 MED ORDER — ONDANSETRON HCL 4 MG/2ML IJ SOLN: 4.0000 mg | Freq: Once | INTRAMUSCULAR | Status: DC

## 2024-03-29 NOTE — ED Triage Notes (Signed)
 Pt feels like she is retaining a lot of fluid. Abdomin is distended and she said she feels this way when she accumulates to much fluid.

## 2024-03-29 NOTE — ED Notes (Signed)
 Bladder scan 421 mL

## 2024-03-29 NOTE — ED Provider Notes (Incomplete)
 " [place] [facility] AT [facility] Provider Note   CSN: [id number] Arrival date & time: 03/29/24  1709     Patient presents with: Abdominal Pain   Jennifer [name] is a 34 y.o. female.  {Add pertinent medical, surgical, social history, OB history to HPI:8438} 34 year old female presenting with fluid volume overload.  Patient notes that she has a history of CHF/CKD, is on 60 mg of torsemide  at home but did double her dose today, despite this she has not urinated at all since last night, history of urinary retention requiring catheterization.  Reports abdominal distention with shortness of breath, no chest pain.  Reports right shin burning, notes that her right lower extremity is typically larger than the left and that this is normal for her.  Endorses some nausea, no vomiting.  Reports that she typically has an elevated heart rate.   Abdominal Pain      Prior to Admission medications  Medication Sig Start Date End Date Taking? Authorizing Provider  albuterol  (VENTOLIN  HFA) 108 (90 Base) MCG/ACT inhaler Inhale 2 puffs into the lungs daily as needed for wheezing or shortness of breath. 10/03/23   [provider]  allopurinol  (ZYLOPRIM ) 100 MG tablet Take 1 tablet (100 mg total) by mouth daily. 02/21/24   [name], Toribio SAUNDERS, MD  amitriptyline  (ELAVIL ) 10 MG tablet Take 10 mg by mouth. Patient taking differently: Take 10 mg by mouth at bedtime. 02/01/24   [provider]  aspirin  EC 81 MG tablet Take 1 tablet (81 mg total) by mouth daily. Swallow whole. 12/18/23   [name], Toribio SAUNDERS, MD  atorvastatin  (LIPITOR ) 80 MG tablet Take 1 tablet (80 mg total) by mouth every evening. 02/25/24   Regalado, Belkys A, MD  azelastine  (ASTELIN ) 0.1 % nasal spray Place 2 sprays into both nostrils daily as needed for rhinitis or allergies.    [provider]  BAQSIMI ONE PACK 3 MG/DOSE POWD Place 1 Dose into the nose once as needed (hypoglycemia).  07/31/23   [provider]  Biotin 89999 MCG TABS Take 1 tablet by mouth daily.    [provider]  calcitRIOL  (ROCALTROL ) 0.25 MCG capsule Take 0.25 mcg by mouth daily. 08/04/23   [provider]  dicyclomine  (BENTYL ) 20 MG tablet Take 1 tablet (20 mg total) by mouth 2 (two) times daily. 09/05/23   Dreama Longs, MD  doxazosin  (CARDURA ) 4 MG tablet Take 1 tablet (4 mg total) by mouth daily. 02/28/24   Colletta Manuelita Garre, PA-C  DULoxetine  (CYMBALTA ) 60 MG capsule Take 60 mg by mouth at bedtime. 09/26/22   [provider]  ergocalciferol  (VITAMIN D2) 1.25 MG (50000 UT) capsule Take 1 capsule (50,000 Units total) by mouth every Sunday. 11/12/23   Lee, Jordan, NP  Glucose Blood (BLOOD GLUCOSE TEST STRIPS) STRP 1 each by Does not apply route 3 (three) times daily. Use as directed to check blood sugar. May dispense any manufacturer covered by patient's insurance and fits patient's device. 08/26/23   Shalhoub, Zachary PARAS, MD  HUMALOG  KWIKPEN 100 UNIT/ML KwikPen Inject 60 Units into the skin in the morning, at noon, and at bedtime. Patient taking differently: Inject 60 Units into the skin in the morning, at noon, and at bedtime.    [provider]  HUMULIN  R U-500 KWIKPEN 500 UNIT/ML KwikPen Inject 300 Units into the skin in the morning and at bedtime. Patient taking differently: Inject 150 Units into the skin in the morning and at bedtime.  [provider]  hydrALAZINE  (APRESOLINE ) 100 MG tablet Take 1 tablet (100 mg total) by mouth every 8 (eight) hours. 01/02/24   Arrien, Elidia Sieving, MD  Insulin  Pen Needle (PEN NEEDLES) 31G X 5 MM MISC Use as directed 3 (three) times daily. 08/26/23   Shalhoub, Zachary PARAS, MD  isosorbide  mononitrate (IMDUR ) 60 MG 24 hr tablet Take 1 tablet (60 mg total) by mouth daily. 01/03/24   Arrien, Mauricio Daniel, MD  ivabradine  (CORLANOR ) 7.5 MG TABS tablet Take 1 tablet (7.5 mg total) by mouth 2 (two) times daily with a meal.  01/08/24   Milford, Harlene HERO, FNP  Lancet Device MISC 1 each by Does not apply route 3 (three) times daily. May dispense any manufacturer covered by patient's insurance. 08/26/23   Shalhoub, Zachary PARAS, MD  Lancets MISC 1 each by Does not apply route 3 (three) times daily. Use as directed to check blood sugar. May dispense any manufacturer covered by patient's insurance and fits patient's device. 08/26/23   Shalhoub, Zachary PARAS, MD  lansoprazole  (PREVACID ) 30 MG capsule TAKE 1 CAPSULE (30 MG TOTAL) BY MOUTH 2 (TWO) TIMES DAILY BEFORE A MEAL. 02/05/24   May, Deanna J, NP  levocetirizine (XYZAL ) 5 MG tablet Take 5 mg by mouth at bedtime. 03/05/22   [provider]  levothyroxine  (SYNTHROID ) 150 MCG tablet Take 150 mcg by mouth daily before breakfast.    [provider]  methylPREDNISolone  (MEDROL  DOSEPAK) 4 MG TBPK tablet Take as directed Patient not taking: Reported on 03/25/2024 02/08/24   Tobie Franky SQUIBB, DPM  [place] Sodium (TRYNGOLZA) 80 MG/0.8ML SOAJ Inject 1 Units into the skin every 30 (thirty) days.    [provider]  pregabalin  (LYRICA ) 300 MG capsule Take 300 mg by mouth at bedtime.    [provider]  promethazine  (PHENERGAN ) 25 MG tablet Take 25 mg by mouth at bedtime.    [provider]  Safety Seal Miscellaneous MISC Hormonic Hair Solution with minoxidil USP 7% and finasteride USP 0.05% - apply to affected areas daily every morning. 06/06/23   Alm Delon SAILOR, DO  SUMAtriptan  (IMITREX ) 100 MG tablet Take 100 mg by mouth daily as needed. Patient not taking: Reported on 03/25/2024 10/11/23   [provider]  torsemide  (DEMADEX ) 20 MG tablet Take 3 tablets (60 mg total) by mouth daily. 02/28/24   Regalado, Belkys A, MD  tretinoin  (RETIN-A ) 0.05 % cream Apply 1 Application topically at bedtime.    [provider]    Allergies: Icosapent  ethyl (epa ethyl ester) (fish), Ketamine, [place], Morphine , Mushroom, Penicillins, Shellfish  allergy, Fentanyl , and Prednisone    Review of Systems  Gastrointestinal:  Positive for abdominal pain.    Updated Vital Signs  Vitals:   03/29/24 1716 03/29/24 1717 03/29/24 2200  BP: (!) 165/72  (!) 164/90  Pulse: (!) 103  (!) 105  Resp: 18  (!) 23  Temp: 98.2 F (36.8 C)    TempSrc: Oral    SpO2: 96%  93%  Weight:  71.2 kg   Height:  4' 9 (1.448 m)      Physical Exam Vitals and nursing note reviewed.  HENT:     Head: Normocephalic.  Eyes:     Extraocular Movements: Extraocular movements intact.  Cardiovascular:     Rate and Rhythm: Regular rhythm. Tachycardia present.     Heart sounds: Murmur heard.  Pulmonary:     Effort: Pulmonary effort is normal. No respiratory distress.     Breath sounds:  Normal breath sounds.  Abdominal:     General: There is distension.     Palpations: Abdomen is soft.     Tenderness: There is abdominal tenderness (mild, epigastric). There is no guarding.  Musculoskeletal:     Cervical back: Normal range of motion.     Comments: Moves all extremities spontaneously without difficulty R calf is symmetrically enlarged as compared to the left, no tenderness on exam, no appreciable erythema/warmth  Skin:    General: Skin is warm and dry.  Neurological:     General: No focal deficit present.     Mental Status: [name] alert and oriented to person, place, and time.     (all labs ordered are listed, but only abnormal results are displayed) Labs Reviewed  BASIC METABOLIC PANEL WITH GFR - Abnormal; Notable for the following components:      Result Value   Glucose, Bld 185 (*)    BUN 76 (*)    Creatinine, Ser 2.76 (*)    GFR, Estimated 23 (*)    Anion gap 20 (*)    All other components within normal limits  CBC - Abnormal; Notable for the following components:   RBC 3.13 (*)    Hemoglobin 9.6 (*)    HCT 28.9 (*)    RDW 16.7 (*)    Platelets 111 (*)    All other components within normal limits    EKG: EKG  Interpretation Date/Time:  Friday March 29 2024 17:26:02 EST Ventricular Rate:  95 PR Interval:  120 QRS Duration:  86 QT Interval:  392 QTC Calculation: 492 R Axis:   11  Text Interpretation: Normal sinus rhythm Nonspecific ST abnormality Prolonged QT Abnormal ECG When compared with ECG of 22-Feb-2024 19:28,  nonspecific changes along the ST segment /anterior T wave. Confirmed by Patsey Lot [phone]) on 03/29/2024 5:30:20 PM  Radiology: DG Chest 2 View Result Date: 03/29/2024 CLINICAL DATA:  Shortness of breath EXAM: CHEST - 2 VIEW COMPARISON:  02/22/2024, 12/07/2023 FINDINGS: Mild cardiomegaly. Stable cardiac monitoring device. No acute airspace disease, pleural effusion or pneumothorax IMPRESSION: No active cardiopulmonary disease. Mild cardiomegaly. Electronically Signed   By: Luke Bun M.D.   On: 03/29/2024 18:54    {Document cardiac monitor, telemetry assessment procedure when appropriate:32947} Procedures   Medications Ordered in the ED - No data to display    {Click here for ABCD2, HEART and other calculators REFRESH Note before signing:1}                              Medical Decision Making This patient presents to the ED for concern of fluid overload/shortness of breath, this involves an extensive number of treatment options, and is a complaint that carries with it a high risk of complications and morbidity.  The differential diagnosis includes fluid overload secondary to CHF, fluid overload secondary to CKD, urinary retention, ACS, pneumonia   Co morbidities that complicate the patient evaluation  CHF, CKD   Additional history obtained:  Additional history obtained from record review External records from outside source obtained and reviewed including prior cardiology note, recent hospital discharge summary   Lab Tests:  I Ordered, and personally interpreted labs.  The pertinent results include: CBC largely stable from previous with hemoglobin of 9.6 up  from most recent baseline of 9.3, thrombocytopenia largely consistent with baseline with platelet count of 111 today and 133 from 1 month prior.  BMP with stable renal function  with a creatinine of 2.76, anion gap elevated today at 20.  BNP elevated at 414, improved from most recent baseline of 685 from 1 month prior. Troponin elevated at 105 which is a change from her most recent baseline of 75, repeat pending at time of admission. Lipase ***.  Urinalysis unremarkable.   Imaging Studies ordered:  I ordered imaging studies including CXR  I independently visualized and interpreted imaging which showed No active cardiopulmonary disease. Mild cardiomegaly.  I agree with the radiologist interpretation   Cardiac Monitoring: / EKG:  The patient was maintained on a cardiac monitor.  I personally viewed and interpreted the cardiac monitored which showed an underlying rhythm of: Sinus tachycardia   Consultations Obtained:  I requested consultation with cardiology and hospitalist,  and discussed lab and imaging findings as well as pertinent plan - they recommend: I spoke with Dr. Orlando with cardiology, he recommends trending troponin, although he does believe that there is no concern for ACS at this time given that patient is asymptomatic/not having chest pain, elevated troponin is likely due to acute CHF exacerbation, plans to consult on this patient's care during her hospitalization.  No further intervention necessary unless troponin continues to climb.  I spoke with Dr. Sundil with the hospitalist service who agrees that this patient is appropriate for admission.   Problem List / ED Course / Critical interventions / Medication management  I ordered medication including Tylenol  for shin pain, furosemide  for fluid retention/diuresis, Reglan  for nausea, Dilaudid  for abdominal pain Reevaluation of the patient after these medicines showed that the patient improved I have reviewed the patients home  medicines and have made adjustments as needed   Social Determinants of Health:  Insufficient physical activity   Test / Admission - Considered:  Physical exam is notable as above, patient does have abdominal distention on exam with mild tenderness in the epigastric region, no guarding/rigidity.  Patient has not urinated in approximately 24 hours, will proceed with bladder scan.  Bladder scan notable for 400cc urine, Foley catheter placed given need for continued diuresis. On 60mg  Torsemide  at home, during recent hospitalization patient was diuresed at 120 mg of Lasix  IV twice daily, will administer 120 mg Lasix  and reassess. Patient producing urine after Foley catheterization/administration of IV diuresis. Patient complaining of mild epigastric discomfort and nausea, reports history of chronic pancreatitis.  Will add on lipase for further assessment. Troponin found to be elevated above baseline as above, I spoke with the cardiologist who does not feel the patient symptoms are related to ACS as she is not having chest pain, recommends continued trending of troponin with plan to consult on patient during hospitalization. Case discussed with Dr. Patsey.   Amount and/or Complexity of Data Reviewed Labs: ordered. Radiology: ordered.  Risk OTC drugs. Prescription drug management.   ***  {Document critical care time when appropriate  Document review of labs and clinical decision tools ie CHADS2VASC2, etc  Document your independent review of radiology images and any outside records  Document your discussion with family members, caretakers and with consultants  Document social determinants of health affecting pt's care  Document your decision making why or why not admission, treatments were needed:32947:::1}   Final diagnoses:  None    ED Discharge Orders     None        "

## 2024-03-29 NOTE — ED Provider Notes (Signed)
 " [place] EMERGENCY DEPARTMENT AT [facility] Provider Note   CSN: [id number] Arrival date & time: 03/29/24  1709     Patient presents with: Abdominal Pain   Jennifer [name] is a 34 y.o. female.   34 year old female presenting with fluid volume overload.  Patient notes that she has a history of CHF/CKD, is on 60 mg of torsemide  at home but did double her dose today, despite this she has not urinated at all since last night, history of urinary retention requiring catheterization.  Reports abdominal distention with shortness of breath, no chest pain.  Reports right shin burning, notes that her right lower extremity is typically larger than the left and that this is normal for her.  Endorses some nausea, no vomiting.  Reports that she typically has an elevated heart rate.   Abdominal Pain      Prior to Admission medications  Medication Sig Start Date End Date Taking? Authorizing Provider  albuterol  (VENTOLIN  HFA) 108 (90 Base) MCG/ACT inhaler Inhale 2 puffs into the lungs daily as needed for wheezing or shortness of breath. 10/03/23   [provider]  allopurinol  (ZYLOPRIM ) 100 MG tablet Take 1 tablet (100 mg total) by mouth daily. 02/21/24   [name], Toribio SAUNDERS, MD  amitriptyline  (ELAVIL ) 10 MG tablet Take 10 mg by mouth. Patient taking differently: Take 10 mg by mouth at bedtime. 02/01/24   [provider]  aspirin  EC 81 MG tablet Take 1 tablet (81 mg total) by mouth daily. Swallow whole. 12/18/23   [name], Toribio SAUNDERS, MD  atorvastatin  (LIPITOR ) 80 MG tablet Take 1 tablet (80 mg total) by mouth every evening. 02/25/24   Regalado, Belkys A, MD  azelastine  (ASTELIN ) 0.1 % nasal spray Place 2 sprays into both nostrils daily as needed for rhinitis or allergies.    [provider]  BAQSIMI ONE PACK 3 MG/DOSE POWD Place 1 Dose into the nose once as needed (hypoglycemia). 07/31/23   [provider]  Biotin 89999 MCG TABS Take 1 tablet by  mouth daily.    [provider]  calcitRIOL  (ROCALTROL ) 0.25 MCG capsule Take 0.25 mcg by mouth daily. 08/04/23   [provider]  dicyclomine  (BENTYL ) 20 MG tablet Take 1 tablet (20 mg total) by mouth 2 (two) times daily. 09/05/23   Dreama Longs, MD  doxazosin  (CARDURA ) 4 MG tablet Take 1 tablet (4 mg total) by mouth daily. 02/28/24   Colletta Manuelita Garre, PA-C  DULoxetine  (CYMBALTA ) 60 MG capsule Take 60 mg by mouth at bedtime. 09/26/22   [provider]  ergocalciferol  (VITAMIN D2) 1.25 MG (50000 UT) capsule Take 1 capsule (50,000 Units total) by mouth every Sunday. 11/12/23   Lee, Jordan, NP  Glucose Blood (BLOOD GLUCOSE TEST STRIPS) STRP 1 each by Does not apply route 3 (three) times daily. Use as directed to check blood sugar. May dispense any manufacturer covered by patient's insurance and fits patient's device. 08/26/23   Shalhoub, Zachary PARAS, MD  HUMALOG  KWIKPEN 100 UNIT/ML KwikPen Inject 60 Units into the skin in the morning, at noon, and at bedtime. Patient taking differently: Inject 60 Units into the skin in the morning, at noon, and at bedtime.    [provider]  HUMULIN  R U-500 KWIKPEN 500 UNIT/ML KwikPen Inject 300 Units into the skin in the morning and at bedtime. Patient taking differently: Inject 150 Units into the skin in the morning and at bedtime.    [provider]  hydrALAZINE  (APRESOLINE ) 100 MG tablet  Take 1 tablet (100 mg total) by mouth every 8 (eight) hours. 01/02/24   Arrien, Elidia Sieving, MD  Insulin  Pen Needle (PEN NEEDLES) 31G X 5 MM MISC Use as directed 3 (three) times daily. 08/26/23   Shalhoub, Zachary PARAS, MD  isosorbide  mononitrate (IMDUR ) 60 MG 24 hr tablet Take 1 tablet (60 mg total) by mouth daily. 01/03/24   Arrien, Elidia Sieving, MD  ivabradine  (CORLANOR ) 7.5 MG TABS tablet Take 1 tablet (7.5 mg total) by mouth 2 (two) times daily with a meal. 01/08/24   Milford, Harlene HERO, FNP  Lancet Device MISC 1 each by Does not  apply route 3 (three) times daily. May dispense any manufacturer covered by patient's insurance. 08/26/23   Shalhoub, Zachary PARAS, MD  Lancets MISC 1 each by Does not apply route 3 (three) times daily. Use as directed to check blood sugar. May dispense any manufacturer covered by patient's insurance and fits patient's device. 08/26/23   Shalhoub, Zachary PARAS, MD  lansoprazole  (PREVACID ) 30 MG capsule TAKE 1 CAPSULE (30 MG TOTAL) BY MOUTH 2 (TWO) TIMES DAILY BEFORE A MEAL. 02/05/24   May, Deanna J, NP  levocetirizine (XYZAL ) 5 MG tablet Take 5 mg by mouth at bedtime. 03/05/22   [provider]  levothyroxine  (SYNTHROID ) 150 MCG tablet Take 150 mcg by mouth daily before breakfast.    [provider]  methylPREDNISolone  (MEDROL  DOSEPAK) 4 MG TBPK tablet Take as directed Patient not taking: Reported on 03/25/2024 02/08/24   Tobie Franky SQUIBB, DPM  [place] Sodium (TRYNGOLZA) 80 MG/0.8ML SOAJ Inject 1 Units into the skin every 30 (thirty) days.    [provider]  pregabalin  (LYRICA ) 300 MG capsule Take 300 mg by mouth at bedtime.    [provider]  promethazine  (PHENERGAN ) 25 MG tablet Take 25 mg by mouth at bedtime.    [provider]  Safety Seal Miscellaneous MISC Hormonic Hair Solution with minoxidil USP 7% and finasteride USP 0.05% - apply to affected areas daily every morning. 06/06/23   Alm Delon SAILOR, DO  SUMAtriptan  (IMITREX ) 100 MG tablet Take 100 mg by mouth daily as needed. Patient not taking: Reported on 03/25/2024 10/11/23   [provider]  torsemide  (DEMADEX ) 20 MG tablet Take 3 tablets (60 mg total) by mouth daily. 02/28/24   Regalado, Belkys A, MD  tretinoin  (RETIN-A ) 0.05 % cream Apply 1 Application topically at bedtime.    [provider]    Allergies: Icosapent  ethyl (epa ethyl ester) (fish), Ketamine, [place], Morphine , Mushroom, Penicillins, Shellfish allergy, Fentanyl , and Prednisone    Review of Systems  Gastrointestinal:   Positive for abdominal pain.    Updated Vital Signs  Vitals:   03/29/24 1716 03/29/24 1717 03/29/24 2200  BP: (!) 165/72  (!) 164/90  Pulse: (!) 103  (!) 105  Resp: 18  (!) 23  Temp: 98.2 F (36.8 C)    TempSrc: Oral    SpO2: 96%  93%  Weight:  71.2 kg   Height:  4' 9 (1.448 m)      Physical Exam Vitals and nursing note reviewed.  HENT:     Head: Normocephalic.  Eyes:     Extraocular Movements: Extraocular movements intact.  Cardiovascular:     Rate and Rhythm: Regular rhythm. Tachycardia present.     Heart sounds: Murmur heard.  Pulmonary:     Effort: Pulmonary effort is normal. No respiratory distress.     Breath sounds: Normal breath sounds.  Abdominal:  General: There is distension.     Palpations: Abdomen is soft.     Tenderness: There is abdominal tenderness (mild, epigastric). There is no guarding.  Musculoskeletal:     Cervical back: Normal range of motion.     Comments: Moves all extremities spontaneously without difficulty R calf is symmetrically enlarged as compared to the left, no tenderness on exam, no appreciable erythema/warmth  Skin:    General: Skin is warm and dry.  Neurological:     General: No focal deficit present.     Mental Status: [name] alert and oriented to person, place, and time.     (all labs ordered are listed, but only abnormal results are displayed) Labs Reviewed  BASIC METABOLIC PANEL WITH GFR - Abnormal; Notable for the following components:      Result Value   Glucose, Bld 185 (*)    BUN 76 (*)    Creatinine, Ser 2.76 (*)    GFR, Estimated 23 (*)    Anion gap 20 (*)    All other components within normal limits  CBC - Abnormal; Notable for the following components:   RBC 3.13 (*)    Hemoglobin 9.6 (*)    HCT 28.9 (*)    RDW 16.7 (*)    Platelets 111 (*)    All other components within normal limits  PRO BRAIN NATRIURETIC PEPTIDE - Abnormal; Notable for the following components:   Pro Brain Natriuretic Peptide 414.0  (*)    All other components within normal limits  URINALYSIS, ROUTINE W REFLEX MICROSCOPIC - Abnormal; Notable for the following components:   Color, Urine STRAW (*)    All other components within normal limits  CBG MONITORING, ED - Abnormal; Notable for the following components:   Glucose-Capillary 138 (*)    All other components within normal limits  TROPONIN T, HIGH SENSITIVITY - Abnormal; Notable for the following components:   Troponin T High Sensitivity 105 (*)    All other components within normal limits  LIPASE, BLOOD  TROPONIN T, HIGH SENSITIVITY    EKG: EKG Interpretation Date/Time:  Friday March 29 2024 17:26:02 EST Ventricular Rate:  95 PR Interval:  120 QRS Duration:  86 QT Interval:  392 QTC Calculation: 492 R Axis:   11  Text Interpretation: Normal sinus rhythm Nonspecific ST abnormality Prolonged QT Abnormal ECG When compared with ECG of 22-Feb-2024 19:28,  nonspecific changes along the ST segment /anterior T wave. Confirmed by Patsey Lot [phone]) on 03/29/2024 5:30:20 PM  Radiology: DG Chest 2 View Result Date: 03/29/2024 CLINICAL DATA:  Shortness of breath EXAM: CHEST - 2 VIEW COMPARISON:  02/22/2024, 12/07/2023 FINDINGS: Mild cardiomegaly. Stable cardiac monitoring device. No acute airspace disease, pleural effusion or pneumothorax IMPRESSION: No active cardiopulmonary disease. Mild cardiomegaly. Electronically Signed   By: Luke Bun M.D.   On: 03/29/2024 18:54     Procedures   Medications Ordered in the ED  furosemide  (LASIX ) 10 MG/ML injection (  Not Given 03/29/24 2304)  HYDROmorphone  (DILAUDID ) injection 0.5 mg (has no administration in time range)  metoCLOPramide  (REGLAN ) injection 10 mg (has no administration in time range)  acetaminophen  (TYLENOL ) tablet 650 mg (650 mg Oral Given 03/29/24 2224)  furosemide  (LASIX ) 120 mg in dextrose  5 % 50 mL IVPB (120 mg Intravenous New Bag/Given 03/29/24 2304)  Medical  Decision Making This patient presents to the ED for concern of fluid overload/shortness of breath, this involves an extensive number of treatment options, and is a complaint that carries with it a high risk of complications and morbidity.  The differential diagnosis includes fluid overload secondary to CHF, fluid overload secondary to CKD, urinary retention, ACS, pneumonia   Co morbidities that complicate the patient evaluation  CHF, CKD   Additional history obtained:  Additional history obtained from record review External records from outside source obtained and reviewed including prior cardiology note, recent hospital discharge summary   Lab Tests:  I Ordered, and personally interpreted labs.  The pertinent results include: CBC largely stable from previous with hemoglobin of 9.6 up from most recent baseline of 9.3, thrombocytopenia largely consistent with baseline with platelet count of 111 today and 133 from 1 month prior.  BMP with stable renal function with a creatinine of 2.76, anion gap elevated today at 20.  BNP elevated at 414, improved from most recent baseline of 685 from 1 month prior. Troponin elevated at 105 which is a change from her most recent baseline of 75, repeat pending at time of admission. Lipase pending at time of admission.  Urinalysis unremarkable.   Imaging Studies ordered:  I ordered imaging studies including CXR  I independently visualized and interpreted imaging which showed No active cardiopulmonary disease. Mild cardiomegaly.  I agree with the radiologist interpretation   Cardiac Monitoring: / EKG:  The patient was maintained on a cardiac monitor.  I personally viewed and interpreted the cardiac monitored which showed an underlying rhythm of: Sinus tachycardia   Consultations Obtained:  I requested consultation with cardiology and hospitalist,  and discussed lab and imaging findings as well as pertinent plan - they recommend: I spoke with Dr.  Orlando with cardiology, he recommends trending troponin, although he does believe that there is no concern for ACS at this time given that patient is asymptomatic/not having chest pain, elevated troponin is likely due to acute CHF exacerbation, plans to consult on this patient's care during her hospitalization.  No further intervention necessary unless troponin continues to climb.  I spoke with Dr. Sundil with the hospitalist service who agrees that this patient is appropriate for admission.   Problem List / ED Course / Critical interventions / Medication management  I ordered medication including Tylenol  for shin pain, furosemide  for fluid retention/diuresis, Reglan  for nausea, Dilaudid  for abdominal pain Reevaluation of the patient after these medicines showed that the patient improved I have reviewed the patients home medicines and have made adjustments as needed   Social Determinants of Health:  Insufficient physical activity   Test / Admission - Considered:  Physical exam is notable as above, patient does have abdominal distention on exam with mild tenderness in the epigastric region, no guarding/rigidity.  Patient does have some size difference of the right lower extremity as compared to the left, however reports that this is consistent with her baseline and is not a new finding.  Patient has not urinated in approximately 24 hours, will proceed with bladder scan.  Bladder scan notable for 400cc urine, Foley catheter placed given need for continued diuresis. On 60mg  Torsemide  at home, during recent hospitalization patient was diuresed at 120 mg of Lasix  IV twice daily, will administer 120 mg Lasix  and reassess. Patient producing urine after Foley catheterization/administration of IV diuresis.  Creatinine stable from baseline. Patient complaining of mild epigastric discomfort and nausea, reports history of chronic pancreatitis.  Will  add on lipase for further assessment, lipase pending at  time of hospital admission. Troponin found to be elevated above baseline as above, I spoke with the cardiologist who does not feel that the patient's symptoms are related to ACS as she is not having chest pain, recommends continued trending of troponin with plan to consult on patient during hospitalization. The hospitalist service is in agreement with admission as above. Case discussed with Dr. Patsey.   Amount and/or Complexity of Data Reviewed Labs: ordered. Radiology: ordered.  Risk OTC drugs. Prescription drug management.        Final diagnoses:  Elevated troponin  Hypervolemia, unspecified hypervolemia type    ED Discharge Orders     None          Glendia Rocky SAILOR, [place] 03/30/24 0006  "

## 2024-03-29 NOTE — Plan of Care (Addendum)
 [facility] emergency department [place] telemetry bed transfer:  34 year old female astrocytoma s/p resection 1997 with history of HFrEF, severe hypertriglyceridemia due to LPL deficiency, chronic pancreatitis, hypothyroidism, essential hypertension, insulin -dependent DM type II, CKD 3B, obstructive sleep apnea on CPAP, and chronic hyponatremia presented to emergency department complaining of concern for volume overload.  Patient reported that she is on torsemide  60 mg at home and she took torsemide  120 mg today despite did not have any urination since last night. Per patient she has history of chronic urine retention requiring self cath.  Also complaining about abdominal distention, shortness of breath.  Denies any chest pain and palpitation.  Endorsing some nausea.  Denies any vomiting, abdominal pain, fever and chill.  At presentation to ED patient found borderline hypertensive, tachycardic heart rate up to 105 and tachypneic 23.  O2 sat 95 to 96% room air.  Afebrile. Lab, CBC unremarkable.  BMP showing creatinine 2.76 and GFR 23.  Renal function at baseline, elevated anion gap 20, elevated blood glucose 185.  UA unremarkable. Elevated proBNP 414 which is around baseline. Elevated troponin 105 and pending second troponin level.  Baseline troponin around 70-80 range.   EKG showed normal sinus rhythm heart rate 95, nonspecific ST abnormality.  Chest x-ray no acute disease process.  Mild cardiomegaly.  In the ED bladder scan obtained which showed urine retention 421 mL.  EDP consulted and spoke with on-call cardiology Dr. Orlando recommended to trend troponin as concern for demand ischemia from CHF and cardiology consult once admitted.  In the ED patient received IV Lasix  120 mg.  Hospitalist consulted for further eval for management of acute on chronic CHF exacerbation and elevated troponin in the context of demand ischemia from CHF exacerbation.   [facility] will assume care on arrival to  accepting facility. Until arrival, care as per EDP. However, [facility] available 24/7 for questions and assistance. Check [email or web address] for on-call coverage. Nursing staff, please call [facility] Admits & Consults System-Wide number under Amion on patient's arrival so appropriate admitting provider can evaluate the pt.   Author: Aramis Zobel, MD  [facility]

## 2024-03-30 ENCOUNTER — Other Ambulatory Visit: Payer: Self-pay

## 2024-03-30 ENCOUNTER — Encounter (HOSPITAL_BASED_OUTPATIENT_CLINIC_OR_DEPARTMENT_OTHER): Payer: Self-pay | Admitting: Internal Medicine

## 2024-03-30 DIAGNOSIS — N184 Chronic kidney disease, stage 4 (severe): Secondary | ICD-10-CM | POA: Diagnosis not present

## 2024-03-30 DIAGNOSIS — E786 Lipoprotein deficiency: Secondary | ICD-10-CM | POA: Diagnosis not present

## 2024-03-30 DIAGNOSIS — R748 Abnormal levels of other serum enzymes: Secondary | ICD-10-CM | POA: Diagnosis not present

## 2024-03-30 DIAGNOSIS — R739 Hyperglycemia, unspecified: Secondary | ICD-10-CM

## 2024-03-30 DIAGNOSIS — I428 Other cardiomyopathies: Secondary | ICD-10-CM | POA: Diagnosis not present

## 2024-03-30 DIAGNOSIS — I272 Pulmonary hypertension, unspecified: Secondary | ICD-10-CM | POA: Diagnosis not present

## 2024-03-30 DIAGNOSIS — E66811 Obesity, class 1: Secondary | ICD-10-CM

## 2024-03-30 DIAGNOSIS — Z765 Malingerer [conscious simulation]: Secondary | ICD-10-CM | POA: Diagnosis not present

## 2024-03-30 DIAGNOSIS — I10 Essential (primary) hypertension: Secondary | ICD-10-CM

## 2024-03-30 DIAGNOSIS — I5043 Acute on chronic combined systolic (congestive) and diastolic (congestive) heart failure: Secondary | ICD-10-CM | POA: Diagnosis not present

## 2024-03-30 LAB — CBC WITH DIFFERENTIAL/PLATELET
Abs Immature Granulocytes: 0.03 K/uL (ref 0.00–0.07)
Basophils Absolute: 0 K/uL (ref 0.0–0.1)
Basophils Relative: 1 %
Eosinophils Absolute: 0.1 K/uL (ref 0.0–0.5)
Eosinophils Relative: 1 %
HCT: 27.4 % — ABNORMAL LOW (ref 36.0–46.0)
Hemoglobin: 9.1 g/dL — ABNORMAL LOW (ref 12.0–15.0)
Immature Granulocytes: 0 %
Lymphocytes Relative: 32 %
Lymphs Abs: 2.4 K/uL (ref 0.7–4.0)
MCH: 30.7 pg (ref 26.0–34.0)
MCHC: 33.2 g/dL (ref 30.0–36.0)
MCV: 92.6 fL (ref 80.0–100.0)
Monocytes Absolute: 0.4 K/uL (ref 0.1–1.0)
Monocytes Relative: 6 %
Neutro Abs: 4.4 K/uL (ref 1.7–7.7)
Neutrophils Relative %: 60 %
Platelets: 104 K/uL — ABNORMAL LOW (ref 150–400)
RBC: 2.96 MIL/uL — ABNORMAL LOW (ref 3.87–5.11)
RDW: 16.5 % — ABNORMAL HIGH (ref 11.5–15.5)
WBC: 7.4 K/uL (ref 4.0–10.5)
nRBC: 0 % (ref 0.0–0.2)

## 2024-03-30 LAB — HEMOGLOBIN A1C
Hgb A1c MFr Bld: 6.6 % — ABNORMAL HIGH (ref 4.8–5.6)
Mean Plasma Glucose: 142.72 mg/dL

## 2024-03-30 LAB — GLUCOSE, CAPILLARY
Glucose-Capillary: 215 mg/dL — ABNORMAL HIGH (ref 70–99)
Glucose-Capillary: 212 mg/dL — ABNORMAL HIGH (ref 70–99)

## 2024-03-30 LAB — LIPASE, BLOOD: Lipase: 28 U/L (ref 11–51)

## 2024-03-30 LAB — COMPREHENSIVE METABOLIC PANEL WITH GFR
ALT: 252 U/L — ABNORMAL HIGH (ref 0–44)
AST: 101 U/L — ABNORMAL HIGH (ref 15–41)
Albumin: 4.5 g/dL (ref 3.5–5.0)
Alkaline Phosphatase: 262 U/L — ABNORMAL HIGH (ref 38–126)
Anion gap: 17 — ABNORMAL HIGH (ref 5–15)
BUN: 76 mg/dL — ABNORMAL HIGH (ref 6–20)
CO2: 24 mmol/L (ref 22–32)
Calcium: 9.4 mg/dL (ref 8.9–10.3)
Chloride: 96 mmol/L — ABNORMAL LOW (ref 98–111)
Creatinine, Ser: 2.99 mg/dL — ABNORMAL HIGH (ref 0.44–1.00)
GFR, Estimated: 21 mL/min — ABNORMAL LOW
Glucose, Bld: 350 mg/dL — ABNORMAL HIGH (ref 70–99)
Potassium: 4.6 mmol/L (ref 3.5–5.1)
Sodium: 136 mmol/L (ref 135–145)
Total Bilirubin: 0.6 mg/dL (ref 0.0–1.2)
Total Protein: 7.9 g/dL (ref 6.5–8.1)

## 2024-03-30 LAB — MRSA NEXT GEN BY PCR, NASAL: MRSA by PCR Next Gen: DETECTED — AB

## 2024-03-30 LAB — CBG MONITORING, ED
Glucose-Capillary: 312 mg/dL — ABNORMAL HIGH (ref 70–99)
Glucose-Capillary: 329 mg/dL — ABNORMAL HIGH (ref 70–99)
Glucose-Capillary: 333 mg/dL — ABNORMAL HIGH (ref 70–99)

## 2024-03-30 LAB — MAGNESIUM: Magnesium: 1.9 mg/dL (ref 1.7–2.4)

## 2024-03-30 LAB — TROPONIN T, HIGH SENSITIVITY
Troponin T High Sensitivity: 107 ng/L (ref 0–19)
Troponin T High Sensitivity: 99 ng/L — ABNORMAL HIGH (ref 0–19)

## 2024-03-30 MED ORDER — INSULIN ASPART 100 UNIT/ML IJ SOLN
0.0000 [IU] | Freq: Three times a day (TID) | INTRAMUSCULAR | Status: DC
  Administered 2024-03-30: 5 [IU] via SUBCUTANEOUS
  Administered 2024-03-30: 11 [IU] via SUBCUTANEOUS
  Administered 2024-03-31: 5 [IU] via SUBCUTANEOUS
  Administered 2024-03-31: 11 [IU] via SUBCUTANEOUS
  Administered 2024-03-31: 15 [IU] via SUBCUTANEOUS
  Administered 2024-04-01: 5 [IU] via SUBCUTANEOUS
  Administered 2024-04-01: 3 [IU] via SUBCUTANEOUS
  Filled 2024-03-30: qty 15
  Filled 2024-03-30: qty 5
  Filled 2024-03-30: qty 3
  Filled 2024-03-30: qty 11
  Filled 2024-03-30: qty 5
  Filled 2024-03-30: qty 11
  Filled 2024-03-30: qty 5

## 2024-03-30 MED ORDER — IVABRADINE 2.5 MG HALF TABLET
7.5000 mg | ORAL_TABLET | Freq: Two times a day (BID) | ORAL | Status: DC
  Administered 2024-03-30 – 2024-04-01 (×4): 7.5 mg via ORAL
  Filled 2024-03-30 (×5): qty 3

## 2024-03-30 MED ORDER — INSULIN GLARGINE-YFGN 100 UNIT/ML ~~LOC~~ SOLN
40.0000 [IU] | Freq: Every day | SUBCUTANEOUS | Status: DC
  Administered 2024-03-30: 40 [IU] via SUBCUTANEOUS
  Filled 2024-03-30: qty 400

## 2024-03-30 MED ORDER — INSULIN ASPART 100 UNIT/ML IJ SOLN: 50.0000 [IU] | Freq: Once | INTRAMUSCULAR | Status: DC

## 2024-03-30 MED ORDER — HYDROMORPHONE HCL 1 MG/ML IJ SOLN
0.5000 mg | Freq: Once | INTRAMUSCULAR | Status: AC
  Administered 2024-03-30: 0.5 mg via INTRAVENOUS
  Filled 2024-03-30: qty 1

## 2024-03-30 MED ORDER — HYDRALAZINE HCL 25 MG PO TABS
100.0000 mg | ORAL_TABLET | Freq: Three times a day (TID) | ORAL | Status: DC
  Administered 2024-03-30 – 2024-04-01 (×8): 100 mg via ORAL
  Filled 2024-03-30 (×8): qty 4

## 2024-03-30 MED ORDER — PREGABALIN 75 MG PO CAPS
300.0000 mg | ORAL_CAPSULE | Freq: Every day | ORAL | Status: DC
  Administered 2024-03-30 – 2024-03-31 (×2): 300 mg via ORAL
  Filled 2024-03-30 (×2): qty 4

## 2024-03-30 MED ORDER — MUPIROCIN 2 % EX OINT
1.0000 | TOPICAL_OINTMENT | Freq: Two times a day (BID) | CUTANEOUS | Status: DC
  Administered 2024-03-30 – 2024-04-01 (×4): 1 via NASAL
  Filled 2024-03-30 (×2): qty 22

## 2024-03-30 MED ORDER — ACETAMINOPHEN 650 MG RE SUPP: 650.0000 mg | Freq: Four times a day (QID) | RECTAL | Status: DC | PRN

## 2024-03-30 MED ORDER — INSULIN GLARGINE 100 UNIT/ML ~~LOC~~ SOLN
50.0000 [IU] | Freq: Two times a day (BID) | SUBCUTANEOUS | Status: DC
  Administered 2024-03-30 – 2024-03-31 (×2): 50 [IU] via SUBCUTANEOUS
  Filled 2024-03-30 (×3): qty 0.5

## 2024-03-30 MED ORDER — DOXAZOSIN MESYLATE 4 MG PO TABS
4.0000 mg | ORAL_TABLET | Freq: Every day | ORAL | Status: DC
  Administered 2024-03-31 – 2024-04-01 (×2): 4 mg via ORAL
  Filled 2024-03-30 (×3): qty 1

## 2024-03-30 MED ORDER — CHLORHEXIDINE GLUCONATE CLOTH 2 % EX PADS
6.0000 | MEDICATED_PAD | Freq: Every day | CUTANEOUS | Status: DC
  Administered 2024-03-30 – 2024-04-01 (×3): 6 via TOPICAL

## 2024-03-30 MED ORDER — ASPIRIN 81 MG PO TBEC
81.0000 mg | DELAYED_RELEASE_TABLET | Freq: Every day | ORAL | Status: DC
  Administered 2024-03-30 – 2024-04-01 (×3): 81 mg via ORAL
  Filled 2024-03-30 (×3): qty 1

## 2024-03-30 MED ORDER — DICYCLOMINE HCL 10 MG PO CAPS
20.0000 mg | ORAL_CAPSULE | Freq: Two times a day (BID) | ORAL | Status: DC
  Administered 2024-03-30 – 2024-04-01 (×5): 20 mg via ORAL
  Filled 2024-03-30 (×6): qty 2

## 2024-03-30 MED ORDER — ACETAMINOPHEN 325 MG PO TABS: 650.0000 mg | ORAL_TABLET | Freq: Four times a day (QID) | ORAL | Status: DC | PRN

## 2024-03-30 MED ORDER — HEPARIN SODIUM (PORCINE) 5000 UNIT/ML IJ SOLN
5000.0000 [IU] | Freq: Three times a day (TID) | INTRAMUSCULAR | Status: DC
  Administered 2024-03-30 – 2024-04-01 (×6): 5000 [IU] via SUBCUTANEOUS
  Filled 2024-03-30 (×7): qty 1

## 2024-03-30 MED ORDER — LEVOTHYROXINE SODIUM 75 MCG PO TABS
150.0000 ug | ORAL_TABLET | Freq: Every day | ORAL | Status: DC
  Administered 2024-03-30 – 2024-04-01 (×3): 150 ug via ORAL
  Filled 2024-03-30 (×3): qty 2

## 2024-03-30 MED ORDER — ATORVASTATIN CALCIUM 80 MG PO TABS
80.0000 mg | ORAL_TABLET | Freq: Every evening | ORAL | Status: DC
  Administered 2024-03-30 – 2024-03-31 (×2): 80 mg via ORAL
  Filled 2024-03-30 (×2): qty 1

## 2024-03-30 MED ORDER — INSULIN REGULAR HUMAN (CONC) 500 UNIT/ML ~~LOC~~ SOPN: 150.0000 [IU] | PEN_INJECTOR | Freq: Once | SUBCUTANEOUS | Status: DC

## 2024-03-30 MED ORDER — ALLOPURINOL 100 MG PO TABS
100.0000 mg | ORAL_TABLET | Freq: Every day | ORAL | Status: DC
  Administered 2024-03-30 – 2024-04-01 (×3): 100 mg via ORAL
  Filled 2024-03-30 (×3): qty 1

## 2024-03-30 MED ORDER — DICYCLOMINE HCL 20 MG PO TABS
20.0000 mg | ORAL_TABLET | Freq: Two times a day (BID) | ORAL | Status: DC
  Filled 2024-03-30: qty 1

## 2024-03-30 MED ORDER — DEXTROSE 5 % IV SOLN
3.0000 mg | Freq: Once | INTRAVENOUS | Status: AC
  Administered 2024-03-30: 3 mg via INTRAVENOUS
  Filled 2024-03-30: qty 8

## 2024-03-30 MED ORDER — INSULIN ASPART 100 UNIT/ML IJ SOLN
10.0000 [IU] | Freq: Once | INTRAMUSCULAR | Status: AC
  Administered 2024-03-30: 10 [IU] via SUBCUTANEOUS
  Filled 2024-03-30: qty 10

## 2024-03-30 MED ORDER — INSULIN ASPART 100 UNIT/ML IJ SOLN
20.0000 [IU] | Freq: Three times a day (TID) | INTRAMUSCULAR | Status: DC
  Administered 2024-03-30 – 2024-03-31 (×4): 20 [IU] via SUBCUTANEOUS
  Filled 2024-03-30 (×4): qty 20

## 2024-03-30 MED ORDER — INSULIN GLARGINE-YFGN 100 UNIT/ML ~~LOC~~ SOLN: 50.0000 [IU] | Freq: Two times a day (BID) | SUBCUTANEOUS | Status: DC

## 2024-03-30 MED ORDER — CLONIDINE HCL 0.1 MG PO TABS
0.2000 mg | ORAL_TABLET | Freq: Four times a day (QID) | ORAL | Status: DC | PRN
  Administered 2024-03-31: 0.2 mg via ORAL
  Filled 2024-03-30: qty 2

## 2024-03-30 MED ORDER — PANTOPRAZOLE SODIUM 40 MG PO TBEC
40.0000 mg | DELAYED_RELEASE_TABLET | Freq: Every day | ORAL | Status: DC
  Administered 2024-03-30 – 2024-04-01 (×3): 40 mg via ORAL
  Filled 2024-03-30 (×3): qty 1

## 2024-03-30 MED ORDER — ISOSORBIDE MONONITRATE ER 30 MG PO TB24
30.0000 mg | ORAL_TABLET | Freq: Every day | ORAL | Status: DC
  Administered 2024-03-31 – 2024-04-01 (×2): 30 mg via ORAL
  Filled 2024-03-30 (×2): qty 1

## 2024-03-30 NOTE — Assessment & Plan Note (Addendum)
 03/30/2024 her right heart catheterization in September 2005 showed normal RV filling pressures.  With a wedge pressure of 13 mmHg.  However her cardiac output was low at 2.8 L/min.  I think CHF consult will need to reevaluate whether or not patient can tolerate beta-blockers or not.

## 2024-03-30 NOTE — ED Provider Notes (Signed)
" °  Pt awaiting admission at [facility] for CHF. Labs repeated this AM  Similar transaminitis to previous. Normal bicarb and K.  Some morning home meds placed such as thyroid , bp.    Dreama Longs, MD 03/30/24 [phone]  "

## 2024-03-30 NOTE — Assessment & Plan Note (Addendum)
 03/30/2024 assigned to observation telemetry bed.  I suspect her acute exacerbation of her chronic CHF is due to noncompliance.  She has not gotten her CardioMEMS device checked out by the manufacture since she first reported it to her CHF clinic provider on February 28, 2024.  As of March 25, 2024 when she was seen by her [facility] provider, she still has not yet called the device company.  Patient states that she is taking her to furosemide .  Her last [facility] visit was on March 25, 2024 where she weighed 153 pounds.  In the last 4 days since she was seen, she has gained an additional 3 pounds despite saying that she took an extra dose of torsemide  the day of repair medicine visit.  She was really given 120 mg of IV Lasix  at the [facility] ER.  She has documented about 1000 cc of urine output.  She states that she has been on Demadex  for quite a while now.  Not sure if she is ever been changed to Bumex  before.  I think in the future, she will need a predischarge BNP and proBNP to be drawn so that we have lab values when she is euvolemic.  proBNP and BNP level should be drawn since different facilities within [facility] health use the different lab tests.  Her dry weight should be somewhere close to 151 to 152 pounds.  She only needs about 4 to 5 pounds to be diuresed.  [name] ready had 1000 cc of urine output yesterday.  [name] under observation status.  When I walked in the room, her nasal cannula was around her neck and her saturations were 95% on room air.  In review of her chart, she is not on a beta-blocker due to her history of RV dysfunction.  Her echo in November 2025 showed a normal RV function.  This may need to be revisited to see if her guideline directed medical therapy can be titrated better.  CHF consult pending.  Will give her 3 mg of IV Bumex  x 1 and monitor her urine output, serum creatinine.  [name] ready urinated about 1000 mL since yesterday which is about 2.2 pounds.  She only has  about 4-5 pounds in diuresis to lose.

## 2024-03-30 NOTE — Inpatient Diabetes Management (Signed)
 Inpatient Diabetes Program Recommendations  AACE/ADA: New Consensus Statement on Inpatient Glycemic Control (2015)  Target Ranges:  Prepandial:   less than 140 mg/dL      Peak postprandial:   less than 180 mg/dL (1-2 hours)      Critically ill patients:  140 - 180 mg/dL   Lab Results  Component Value Date   GLUCAP 138 (H) 03/29/2024   HGBA1C 8.8 (H) 09/28/2023    Review of Glycemic Control  Diabetes history: DM2 Outpatient Diabetes medications: Humulin  R U500 150 units with breakfast and U500 150 units in the evening, Humalog  60 units TID with meals, Dexcom G7 CGM  Familiar with pt from previous admissions. Will likely need insulin  titration if admitted.  Inpatient Diabetes Program Recommendations:    Consider Lantus  40 units BID  Consider Novolog  0-15 TID with meals + 20 units TID if eating at least 50% meals   Titrate as needed for goal of 140-180 mg/dL.  Follow.  Thank you. Shona Brandy, RD, LDN, CDCES Inpatient Diabetes Coordinator [phone]

## 2024-03-30 NOTE — ED Notes (Addendum)
 1L O2 placed on pt d/t pt desat to 87%. Pt currently sating at 96% Dr. Bari made aware

## 2024-03-30 NOTE — Assessment & Plan Note (Addendum)
 03/30/2024 Body mass index is 33.97 kg/m.

## 2024-03-30 NOTE — Assessment & Plan Note (Addendum)
 03/30/2024 has elevated LFTs long term for last 2 years. Will continue lipitor  and monitor her LFTs.

## 2024-03-30 NOTE — Assessment & Plan Note (Addendum)
 03/30/2024 during our discussion and interview, the patient asked specifically for IV Dilaudid .  She had ready eaten several meals here in the ER.  Her request for IV narcotics were declined.  There is no indication for IV or PO opiates.

## 2024-03-30 NOTE — ED Notes (Signed)
 [facility] at bedside

## 2024-03-30 NOTE — Assessment & Plan Note (Addendum)
 03/30/2024 patient remains on insulin  for her hyperglycemia associated with her pancreatic insufficiency related diabetes.  A1c is pending today.  Her last A1c was drawn in July 2025 of 8.8%.

## 2024-03-30 NOTE — ED Notes (Signed)
 Called [facility] to transport the patient to [facility] rm[facility] [id number]

## 2024-03-30 NOTE — Assessment & Plan Note (Addendum)
 03/30/2024 baseline scr 2.8-3.0.  Monitor her serum creatinine during diuresis.

## 2024-03-30 NOTE — Hospital Course (Addendum)
 CC: retaining fluid HPI: 34 year old female with a history of chronic systolic/diastolic heart failure with an EF of 50-55%, chronic pulmonary hypertension, not on home oxygen, diabetes due to pancreatic insufficiency, obesity, CKD stage IV, with frequent hospital admissions with drug-seeking behavior who presents to the ER today with complaints of of abdominal bloating.  [name] currently enrolled in the para- medicine CHF program.  And was last seen on March 25, 2024.  Her weight during that visit was 69.4 kg.  Her dry weight is normally around 151 to 152 pounds.  She presented to the ER today with a weight of 71.2 kg or 156.6 pounds.  She states that she is compliant with taking torsemide  60 mg 60 mg daily.  Her Doxazosin  dose was recently increased to 4 mg daily during her CHF clinic appointment on February 28, 2024.  There have been questions regarding the patient's compliance with her CHF meds due to frequent hospital admissions.  She does have a CardioMEMS device.  But reported to her CHF clinic appointment on December 3 that is was not working.  Almost a month later, the patient still has not called to get this investigated.  This was as of March 25, 2024.  Again this adds more evidence to the patient's noncompliance.  She presented to the ER on March 30, 2023 due to retention of fluid.  [name] very unclear as far as how long this has been going on.  However she just had a para medicine visit on March 25, 2024.  Patient asking for IV Dilaudid  by name.  This request was declined.  Patient is supposed to be taking Corlanor  twice a day.  Unclear if she is taking it or not.  On arrival temp 98.2 heart rate 103 blood pressure 165/72 satting 96% on room air.  Her O2 saturations did drop to 87% on room air and she was started on 1 L nasal cannula.  White count 7.9, hemoglobin 9.6, platelets of 111  Sodium 139, Tessman 3.9, chloride of 98, bicarb 22, BUN of 76, creatinine 2.76,  glucose 185  proBNP of 414  UA is negative  Lipase 28  Chest x-ray shows stable cardiomegaly.  There is no active cardiopulmonary disease.  There is no airspace disease.  Patient's baseline weight is up about 4 to 5 pounds.  She normally weighs about 151 to 152 pounds.  She was given 120 mg of IV Lasix  in the ER.  [facility]  hospitalist consulted for admission.  Significant Events: Admitted 03/29/2024 acute on chronic systolic/diastolic CHF   Admission Labs: White count 7.9, hemoglobin 9.6, platelets of 111 Sodium 139, Tessman 3.9, chloride of 98, bicarb 22, BUN of 76, creatinine 2.76, glucose 185 proBNP of 414 UA is negative Lipase 28  Admission Imaging Studies: Chest x-ray shows stable cardiomegaly.  There is no active cardiopulmonary disease.  There is no airspace disease.  Significant Labs:   Significant Imaging Studies:   Antibiotic Therapy: Anti-infectives (From admission, onward)    None       Procedures:   Consultants: cardiology

## 2024-03-30 NOTE — Assessment & Plan Note (Addendum)
 03/30/2024 patient remains on hydralazine  100 mg 3 times daily, Cardura  4 mg daily.  Will reorder her Imdur  60 mg daily.

## 2024-03-30 NOTE — Assessment & Plan Note (Addendum)
 03/30/2024 patient has persistently elevated LFTs.  This has been ongoing for at least the last 2 years.

## 2024-03-30 NOTE — Consult Note (Signed)
 "  Cardiology Consultation   Patient ID: Jennifer [name] MRN: [id number]; DOB: [birth year]  Admit date: 03/29/2024 Date of Consult: 03/30/2024  PCP:  Emilio Joesph DEL, PA-C    [facility] Providers Cardiologist:  [name], MD    Patient Profile: Jennifer [name] is a 34 y.o. female with a hx of mixed ischemic and nonischemic cardiomyopathy with HFimpEF (LVEF as low as 25-30% in 2022 and now recovered to 50-55% in 01/2024), CAD with occluded RCA PH (PA 46/23(31) with PVR 6.4 and PCWP 13 in 9/25), hypertension, hyperlipidemia, severe hypertriglyceridemia due to LDL deficiency, IDDM, gastroparesis, chronic pancreatitis, non alcoholic fatty liver disease, CKD4 (baseline Cr ~2.8), OSA, astrocytoma s/p resection in 1997 who is being seen 03/30/2024 for the evaluation of heart failure exacerbation at the request of Dr. Laurence.  History of Present Illness: Jennifer [name] presented to [facility] ED with signs and symptoms of volume overload yesterday on 1/2. She states she had been taking her torsemide  60 mg as prescribed but had decreased urinary output on 03/28/24 and had abdominal distension with is consistent with her prior HF exacerbations. She even took an extra dose of torsemide  60 and still had decreased urinary output.  She was transferred to [facility] earlier this afternoon and has diuresed about 2L net negative after 120 mg of IV Lasix . Upon my evaluation, she states she feels back to her baseline and that her volume status has approached euvolemia. She denies current shortness of breath, chest pain, and states her abdominal distension has improved.  She follows at home with paramedicine. Her recent TTE has showed an improvement in LVEF to 50-55% with G1DD.   Her last hospitalization was on 02/22/24. She states she is frustrated that she is having nearly monthly hospitalizations and is wondering if paramedicine can administer IV diuretics to her at home.    Past Medical  History:  Diagnosis Date   Afib (HCC) 05/12/2021   Brain tumor (HCC) 03/29/1995   astrocytoma   CHF (congestive heart failure) (HCC)    Cholesterosis    CKD (chronic kidney disease) stage 4, GFR 15-29 ml/min (HCC) 05/13/2021   DM (diabetes mellitus) (HCC) 10/10/2018   Fatty liver    Gastroparesis due to DM (HCC) 05/12/2021   HTN (hypertension) 10/10/2018   Hypothyroidism 10/10/2018   Lipoprotein deficiency    Lung disease    longevity long term   Pancreatitis    Polycystic ovary syndrome    Small intestinal bacterial overgrowth (SIBO) 08/26/2021   Type 2 MI (myocardial infarction) (HCC) 01/19/2023    Past Surgical History:  Procedure Laterality Date   ABDOMINAL SURGERY     pt states during miscarriage got her intestine   BRAIN SURGERY     ESOPHAGOGASTRODUODENOSCOPY N/A 09/01/2023   Procedure: EGD (ESOPHAGOGASTRODUODENOSCOPY);  Surgeon: Abran Norleen SAILOR, MD;  Location: [facility];  Service: Gastroenterology;  Laterality: N/A;   EYE MUSCLE SURGERY Right 03/28/2014   PRESSURE SENSOR/CARDIOMEMS N/A 12/06/2021   Procedure: PRESSURE SENSOR/CARDIOMEMS;  Surgeon: [name] Toribio SAUNDERS, MD;  Location: [facility];  Service: Cardiovascular;  Laterality: N/A;   RIGHT HEART CATH N/A 08/05/2021   Procedure: RIGHT HEART CATH;  Surgeon: [name] Toribio SAUNDERS, MD;  Location: [facility] CV [facility];  Service: Cardiovascular;  Laterality: N/A;   RIGHT HEART CATH N/A 12/06/2021   Procedure: RIGHT HEART CATH;  Surgeon: [name] Toribio SAUNDERS, MD;  Location: [facility] CV LAB;  Service: Cardiovascular;  Laterality: N/A;   RIGHT HEART CATH N/A 12/13/2023  Procedure: RIGHT HEART CATH;  Surgeon: Zenaida Morene PARAS, MD;  Location: [facility] INVASIVE CV LAB;  Service: Cardiovascular;  Laterality: N/A;   RIGHT/LEFT HEART CATH AND CORONARY ANGIOGRAPHY N/A 12/03/2020   Procedure: RIGHT/LEFT HEART CATH AND CORONARY ANGIOGRAPHY;  Surgeon: Ladona Heinz, MD;  Location: [facility]VAS[facility] CV [facility];  Service: Cardiovascular;  Laterality:  N/A;   VENTRICULOSTOMY  03/28/1997     Home Medications:  Prior to Admission medications  Medication Sig Start Date End Date Taking? Authorizing Provider  albuterol  (VENTOLIN  HFA) 108 (90 Base) MCG/ACT inhaler Inhale 2 puffs into the lungs daily as needed for wheezing or shortness of breath. 10/03/23   [provider]  allopurinol  (ZYLOPRIM ) 100 MG tablet Take 1 tablet (100 mg total) by mouth daily. 02/21/24   [name], Toribio SAUNDERS, MD  amitriptyline  (ELAVIL ) 10 MG tablet Take 10 mg by mouth. Patient taking differently: Take 10 mg by mouth at bedtime. 02/01/24   [provider]  aspirin  EC 81 MG tablet Take 1 tablet (81 mg total) by mouth daily. Swallow whole. 12/18/23   [name], Toribio SAUNDERS, MD  atorvastatin  (LIPITOR ) 80 MG tablet Take 1 tablet (80 mg total) by mouth every evening. 02/25/24   Regalado, Belkys A, MD  azelastine  (ASTELIN ) 0.1 % nasal spray Place 2 sprays into both nostrils daily as needed for rhinitis or allergies.    [provider]  BAQSIMI ONE PACK 3 MG/DOSE POWD Place 1 Dose into the nose once as needed (hypoglycemia). 07/31/23   [provider]  Biotin 89999 MCG TABS Take 1 tablet by mouth daily.    [provider]  calcitRIOL  (ROCALTROL ) 0.25 MCG capsule Take 0.25 mcg by mouth daily. 08/04/23   [provider]  dicyclomine  (BENTYL ) 20 MG tablet Take 1 tablet (20 mg total) by mouth 2 (two) times daily. 09/05/23   Dreama Longs, MD  doxazosin  (CARDURA ) 4 MG tablet Take 1 tablet (4 mg total) by mouth daily. 02/28/24   Colletta Manuelita Garre, PA-C  DULoxetine  (CYMBALTA ) 60 MG capsule Take 60 mg by mouth at bedtime. 09/26/22   [provider]  ergocalciferol  (VITAMIN D2) 1.25 MG (50000 UT) capsule Take 1 capsule (50,000 Units total) by mouth every Sunday. 11/12/23   Lee, Jordan, NP  Glucose Blood (BLOOD GLUCOSE TEST STRIPS) STRP 1 each by Does not apply route 3 (three) times daily. Use as directed to check blood sugar. May  dispense any manufacturer covered by patient's insurance and fits patient's device. 08/26/23   Shalhoub, Zachary PARAS, MD  HUMALOG  KWIKPEN 100 UNIT/ML KwikPen Inject 60 Units into the skin in the morning, at noon, and at bedtime. Patient taking differently: Inject 60 Units into the skin in the morning, at noon, and at bedtime.    [provider]  HUMULIN  R U-500 KWIKPEN 500 UNIT/ML KwikPen Inject 300 Units into the skin in the morning and at bedtime. Patient taking differently: Inject 150 Units into the skin in the morning and at bedtime.    [provider]  hydrALAZINE  (APRESOLINE ) 100 MG tablet Take 1 tablet (100 mg total) by mouth every 8 (eight) hours. 01/02/24   Arrien, Elidia Toribio, MD  Insulin  Pen Needle (PEN NEEDLES) 31G X 5 MM MISC Use as directed 3 (three) times daily. 08/26/23   Shalhoub, Zachary PARAS, MD  isosorbide  mononitrate (IMDUR ) 60 MG 24 hr tablet Take 1 tablet (60 mg total) by mouth daily. 01/03/24   Arrien, Elidia Toribio, MD  ivabradine  (CORLANOR ) 7.5 MG TABS tablet Take 1 tablet (  7.5 mg total) by mouth 2 (two) times daily with a meal. 01/08/24   Milford, Harlene HERO, FNP  Lancet Device MISC 1 each by Does not apply route 3 (three) times daily. May dispense any manufacturer covered by patient's insurance. 08/26/23   Shalhoub, Zachary PARAS, MD  Lancets MISC 1 each by Does not apply route 3 (three) times daily. Use as directed to check blood sugar. May dispense any manufacturer covered by patient's insurance and fits patient's device. 08/26/23   Shalhoub, Zachary PARAS, MD  lansoprazole  (PREVACID ) 30 MG capsule TAKE 1 CAPSULE (30 MG TOTAL) BY MOUTH 2 (TWO) TIMES DAILY BEFORE A MEAL. 02/05/24   May, Deanna J, NP  levocetirizine (XYZAL ) 5 MG tablet Take 5 mg by mouth at bedtime. 03/05/22   [provider]  levothyroxine  (SYNTHROID ) 150 MCG tablet Take 150 mcg by mouth daily before breakfast.    [provider]  methylPREDNISolone  (MEDROL  DOSEPAK) 4 MG TBPK tablet Take as  directed Patient not taking: Reported on 03/25/2024 02/08/24   Tobie Franky SQUIBB, DPM  [place] Sodium (TRYNGOLZA) 80 MG/0.8ML SOAJ Inject 1 Units into the skin every 30 (thirty) days.    [provider]  pregabalin  (LYRICA ) 300 MG capsule Take 300 mg by mouth at bedtime.    [provider]  promethazine  (PHENERGAN ) 25 MG tablet Take 25 mg by mouth at bedtime.    [provider]  Safety Seal Miscellaneous MISC Hormonic Hair Solution with minoxidil USP 7% and finasteride USP 0.05% - apply to affected areas daily every morning. 06/06/23   Alm Delon SAILOR, DO  SUMAtriptan  (IMITREX ) 100 MG tablet Take 100 mg by mouth daily as needed. Patient not taking: Reported on 03/25/2024 10/11/23   [provider]  torsemide  (DEMADEX ) 20 MG tablet Take 3 tablets (60 mg total) by mouth daily. 02/28/24   Regalado, Belkys A, MD  tretinoin  (RETIN-A ) 0.05 % cream Apply 1 Application topically at bedtime.    [provider]    Scheduled Meds:  allopurinol   100 mg Oral Daily   aspirin  EC  81 mg Oral Daily   atorvastatin   80 mg Oral QPM   dicyclomine   20 mg Oral BID   doxazosin   4 mg Oral Daily   heparin   5,000 Units Subcutaneous Q8H   hydrALAZINE   100 mg Oral Q8H   insulin  aspart  0-15 Units Subcutaneous TID WC   insulin  aspart  20 Units Subcutaneous TID WC   insulin  glargine-yfgn  50 Units Subcutaneous BID   isosorbide  mononitrate  60 mg Oral Daily   ivabradine   7.5 mg Oral BID WC   levothyroxine   150 mcg Oral QAC breakfast   pantoprazole   40 mg Oral Daily   pregabalin   300 mg Oral QHS   Continuous Infusions:  bumetanide  (BUMEX ) IV     PRN Meds: acetaminophen  **OR** acetaminophen , cloNIDine   Allergies:   Allergies[1]  Social History:   Social History   Socioeconomic History   Marital status: Divorced    Spouse name: Not on file   Number of children: 0   Years of education: Not on file   Highest education level: Not on file  Occupational History    Occupation: Dance Movement Psychotherapist   Occupation: N/A  Tobacco Use   Smoking status: Never   Smokeless tobacco: Never  Vaping Use   Vaping status: Never Used  Substance and Sexual Activity   Alcohol  use: Never   Drug use: Never   Sexual activity: Yes  Other Topics Concern  Not on file  Social History Narrative   Not on file   Social Drivers of Health   Tobacco Use: Low Risk (03/29/2024)   Patient History    Smoking Tobacco Use: Never    Smokeless Tobacco Use: Never    Passive Exposure: Not on file  Financial Resource Strain: Medium Risk (01/22/2024)   Received from [facility]   Overall Financial Resource Strain (CARDIA)    How hard is it for you to pay for the very basics like food, housing, medical care, and heating?: Somewhat hard  Food Insecurity: No Food Insecurity (02/23/2024)   Epic    Worried About Programme Researcher, Broadcasting/film/video in the Last Year: Never true    Ran Out of Food in the Last Year: Never true  Recent Concern: Food Insecurity - Food Insecurity Present (01/22/2024)   Received from [facility]   Epic    Within the past 12 months, you worried that your food would run out before you got the money to buy more.: Sometimes true    Within the past 12 months, the food you bought just didn't last and you didn't have money to get more.: Sometimes true  Transportation Needs: No Transportation Needs (02/23/2024)   Epic    Lack of Transportation (Medical): No    Lack of Transportation (Non-Medical): No  Recent Concern: Transportation Needs - Unmet Transportation Needs (01/22/2024)   Received from [facility]    Lack of Transportation (Medical): Patient declined    In the past 12 months, has lack of transportation kept you from meetings, work, or from getting things needed for daily living?: Yes  Physical Activity: Inactive (01/22/2024)   Received from [facility]   Exercise Vital Sign    On average, how many days per week do you engage in moderate to strenuous exercise  (like a brisk walk)?: 0 days    Minutes of Exercise per Session: Not on file  Stress: No Stress Concern Present (01/22/2024)   Received from [facility] of Occupational Health - Occupational Stress Questionnaire    Do you feel stress - tense, restless, nervous, or anxious, or unable to sleep at night because your mind is troubled all the time - these days?: Not at all  Social Connections: Socially Integrated (01/22/2024)   Received from [facility]   Social Network    How would you rate your social network (family, work, friends)?: Good participation with social networks  Intimate Partner Violence: Not At Risk (02/23/2024)   Epic    Fear of Current or Ex-Partner: No    Emotionally Abused: No    Physically Abused: No    Sexually Abused: No  Depression (PHQ2-9): Low Risk (12/10/2021)   Depression (PHQ2-9)    PHQ-2 Score: 0  Alcohol  Screen: Low Risk (12/10/2021)   Alcohol  Screen    Last Alcohol  Screening Score (AUDIT): 0  Housing: Low Risk (02/23/2024)   Epic    Unable to Pay for Housing in the Last Year: No    Number of Times Moved in the Last Year: 0    Homeless in the Last Year: No  Utilities: Not At Risk (02/23/2024)   Epic    Threatened with loss of utilities: No  Health Literacy: Not on file    Family History:   Family History  Problem Relation Age of Onset   Diabetes Mother    Hypertension Mother    Hyperlipidemia Mother    Thyroid  disease Mother  Hypertension Father    Diabetes Father    Pancreatic cancer Paternal Aunt    Pancreatic cancer Paternal Uncle    Colon cancer Neg Hx    Esophageal cancer Neg Hx    Stomach cancer Neg Hx      ROS:  Please see the history of present illness.  All other ROS reviewed and negative.     Physical Exam/Data: Vitals:   03/30/24 1400 03/30/24 1555 03/30/24 1605 03/30/24 1640  BP: (!) 187/105  (!) 172/85 (!) 172/85  Pulse: (!) 116  (!) 115   Resp: 19  20   Temp:   97.7 F (36.5 C)   TempSrc:    Oral   SpO2: 96%  95%   Weight:  69.8 kg    Height:        Intake/Output Summary (Last 24 hours) at 03/30/2024 1741 Last data filed at 03/30/2024 1300 Gross per 24 hour  Intake 57.01 ml  Output 1750 ml  Net -1692.99 ml      03/30/2024    3:55 PM 03/29/2024    5:17 PM 03/25/2024   11:10 AM  Last 3 Weights  Weight (lbs) 153 lb 14.1 oz 157 lb 153 lb  Weight (kg) 69.8 kg 71.215 kg 69.4 kg     Body mass index is 33.3 kg/m.  General:  Well nourished, well developed, in no acute distress HEENT: normal Neck: no JVD Vascular: No carotid bruits; Distal pulses 2+ bilaterally Cardiac:  normal S1, S2; RRR; no murmur Lungs:  clear to auscultation bilaterally, no wheezing, rhonchi or rales  Abd: soft, nontender, no hepatomegaly  Ext: no edema Musculoskeletal:  No deformities, BUE and BLE strength normal and equal Skin: warm and dry  Neuro:  no focal abnormalities noted Psych:  Normal affect   EKG:  The EKG was personally reviewed and demonstrates:  SR, no concerning STT changes, prolonged Qtc Telemetry:  Telemetry was personally reviewed and demonstrates:  SR  Relevant CV Studies: TTE 02/05/24 IMPRESSIONS   1. Left ventricular ejection fraction, by estimation, is 50 to 55%. The  left ventricle has low normal function. The left ventricle has no regional  wall motion abnormalities. There is mild concentric left ventricular  hypertrophy. Left ventricular  diastolic parameters are consistent with Grade I diastolic dysfunction  (impaired relaxation).   2. Right ventricular systolic function is normal. The right ventricular  size is normal. There is mildly elevated pulmonary artery systolic  pressure. The estimated right ventricular systolic pressure is 43.8 mmHg.   3. The mitral valve is normal in structure. Trivial mitral valve  regurgitation. No evidence of mitral stenosis.   4. The aortic valve is grossly normal. Aortic valve regurgitation is  mild. No aortic stenosis is present.   5.  The inferior vena cava is dilated in size with >50% respiratory  variability, suggesting right atrial pressure of 8 mmHg.   Comparison(s): Prior images reviewed side by side. Changes from prior  study are noted. EF slightly improved compared to prior.   RHC 12/13/23 HEMODYNAMICS: RA:       6 mmHg (mean) RV:       46/, 6 mmHg PA:       46/23 mmHg (31 mean) PCWP: 13 mmHg (mean)                                      Estimated Fick CO/CI   2.8L/min, 1.8/min/m2  TPG  18  mmHg                                               PVR  6.4 Wood Units  PAPi  3.8     IMPRESSION: Right heart catheterization for evaluation of volume status and calibration of cardiomems Normal left and right heart filling pressures Significant respiratory variation, but cardiomems correlates well with RHC numbers Reduced cardiac output by assumed Fick, though PA saturation similar to previous cath in 2023 Elevated SVR of 2857dynes/sec*cm-5   RECOMMENDATIONS: Increase afterload reduction, hold diuretics today, start back orals tomorrow When evaluating cardiomems, would look at the PA tracings and use the end expiratory values when looking at [facility]  Right and left heart catheterization 12/03/2020: Normal right heart catheterization pressures.  RA 4/1, mean 1; RV 23/4, EDP 6; PA 22/8, mean 11 and PA saturation 76%.  PW 9/7, mean 7 mmHg. CO 5.6, CI 3.88.  QP/QS 1.00. LV: 136/9, EDP 17 mmHg.  Ao 131/86, mean 105 mmHg.  No pressure gradient across the aortic valve. RCA: A very small but dominant RCA which is diffusely diseased around 80%.  Mid segment 99% occlusion.  Conus branch collateralizes distal RCA. LM: Large, smooth and normal. LAD: Large vessel, minimal diffuse disease.  Small to moderate-sized D1 and D2. CX: Large vessel.  Minimal diffuse disease.  Gives origin to large OM1 and OM 2.   Impression: Nonischemic cardiomyopathy, single vessel occlusion does not explain her  markedly reduced LVEF.  Medical therapy for CAD and nonischemic cardiomyopathy.  I have started her on carvedilol  3.125 mg twice daily.  Laboratory Data: Chemistry Recent Labs  Lab 03/29/24 1728 03/30/24 0720  NA 139 136  K 3.9 4.6  CL 98 96*  CO2 22 24  GLUCOSE 185* 350*  BUN 76* 76*  CREATININE 2.76* 2.99*  CALCIUM  9.4 9.4  MG  --  1.9  GFRNONAA 23* 21*  ANIONGAP 20* 17*    Recent Labs  Lab 03/30/24 0720  PROT 7.9  ALBUMIN  4.5  AST 101*  ALT 252*  ALKPHOS 262*  BILITOT 0.6   Hematology Recent Labs  Lab 03/29/24 1728 03/30/24 0720  WBC 7.9 7.4  RBC 3.13* 2.96*  HGB 9.6* 9.1*  HCT 28.9* 27.4*  MCV 92.3 92.6  MCH 30.7 30.7  MCHC 33.2 33.2  RDW 16.7* 16.5*  PLT 111* 104*   BNP Recent Labs  Lab 03/29/24 2200  PROBNP 414.0*     Radiology/Studies:  DG Chest 2 View Result Date: 03/29/2024 CLINICAL DATA:  Shortness of breath EXAM: CHEST - 2 VIEW COMPARISON:  02/22/2024, 12/07/2023 FINDINGS: Mild cardiomegaly. Stable cardiac monitoring device. No acute airspace disease, pleural effusion or pneumothorax IMPRESSION: No active cardiopulmonary disease. Mild cardiomegaly. Electronically Signed   By: Luke Bun M.D.   On: 03/29/2024 18:54   Assessment and Plan: Acute on chronic systolic and diastolic heart failure with improved ejection fraction  Nonischemic cardiomyopathy Moderate pulmonary hypertension  Coronary artery disease with medically treated occluded RCA Diabetes mellitus Hypertriglyceridemia  Chronic pancreatitis  NAFLD Chronic kidney disease stage 4 Astrocytoma s/p resection in 1997 with residual left sided weakness The patient presented on 1/2 with a 4-5 lb weight gain in the setting of decreased UOP and likely HF exacerbation. She received 120 mg of IV Lasix  and 3 mg of Bumex  and has diuresed nicely (net  negative 2L). She feels back to her baseline and is euvolemic on examination. HF exacerbation etiology is unclear as she has been complaint with  her medications. No concern for ACS with no chest pain and flat troponin trend. She does have worsening kidney function with a Cr now of 2.99. Her baseline Cr has fluctuated and baseline has been near 2.8. There is some concern for medication nonadherence and has not had her CardioMems recalibrated, but the patient tells me that she has been adherent with her medications. - Will have HF team see her in AM - Can hold off on further IV diuresis - Restart home torsemide  60 mg daily  Risk Assessment/Risk Scores:  [place]  Heart Association (NYHA) Functional Class NYHA Class III   For questions or updates, please contact [facility] Please consult [email or web address] for contact info under    Signed, Jerrell DELENA Orchard, MD  03/30/2024 5:41 PM      [1]  Allergies Allergen Reactions   Icosapent  Ethyl (Epa Ethyl Ester) (Fish) Hives   Ketamine Other (See Comments)    In a vegetative state for 15 minutes, per pt   Maitake Itching and Other (See Comments)    Itchy throat   Morphine  Hives, Itching, Rash and Other (See Comments)    Sore throat, too   Mushroom Other (See Comments)    Pt has never had mushrooms but tested positive on allergy test   Penicillins Hives, Itching and Rash    Tolerates cephalosporins   Shellfish Allergy Other (See Comments)    Pt has never had shellfish, but tested positive on allergy test. Pt states contrast in CT is okay.   Fentanyl  Nausea And Vomiting   Prednisone Rash   "

## 2024-03-30 NOTE — H&P (Signed)
 " History and Physical    Jennifer [name] FMW:[id number] DOB: 1990/05/17 DOA: 03/29/2024  DOS: the patient was seen and examined on 03/29/2024  PCP: Emilio Joesph DEL, PA-C   Patient coming from: Home  I have personally briefly reviewed patient's old medical records in [facility] [place] [facility]  CC: retaining fluid HPI: 34 year old female with a history of chronic systolic/diastolic heart failure with an EF of 50-55%, chronic pulmonary hypertension, not on home oxygen, diabetes due to pancreatic insufficiency, obesity, CKD stage IV, with frequent hospital admissions with drug-seeking behavior who presents to the ER today with complaints of of abdominal bloating.  [name] currently enrolled in the para- medicine CHF program.  And was last seen on March 25, 2024.  Her weight during that visit was 69.4 kg.  Her dry weight is normally around 151 to 152 pounds.  She presented to the ER today with a weight of 71.2 kg or 156.6 pounds.  She states that she is compliant with taking torsemide  60 mg 60 mg daily.  Her Doxazosin  dose was recently increased to 4 mg daily during her CHF clinic appointment on February 28, 2024.  There have been questions regarding the patient's compliance with her CHF meds due to frequent hospital admissions.  She does have a CardioMEMS device.  But reported to her CHF clinic appointment on December 3 that is was not working.  Almost a month later, the patient still has not called to get this investigated.  This was as of March 25, 2024.  Again this adds more evidence to the patient's noncompliance.  She presented to the ER on March 30, 2023 due to retention of fluid.  [name] very unclear as far as how long this has been going on.  However she just had a para medicine visit on March 25, 2024.  Patient asking for IV Dilaudid  by name.  This request was declined.  Patient is supposed to be taking Corlanor  twice a day.  Unclear if she is taking it or not.  On arrival  temp 98.2 heart rate 103 blood pressure 165/72 satting 96% on room air.  Her O2 saturations did drop to 87% on room air and she was started on 1 L nasal cannula.  White count 7.9, hemoglobin 9.6, platelets of 111  Sodium 139, Tessman 3.9, chloride of 98, bicarb 22, BUN of 76, creatinine 2.76, glucose 185  proBNP of 414  UA is negative  Lipase 28  Chest x-ray shows stable cardiomegaly.  There is no active cardiopulmonary disease.  There is no airspace disease.  Patient's baseline weight is up about 4 to 5 pounds.  She normally weighs about 151 to 152 pounds.  She was given 120 mg of IV Lasix  in the ER.  [facility]  hospitalist consulted for admission.  Significant Events: Admitted 03/29/2024 acute on chronic systolic/diastolic CHF   Admission Labs: White count 7.9, hemoglobin 9.6, platelets of 111 Sodium 139, Tessman 3.9, chloride of 98, bicarb 22, BUN of 76, creatinine 2.76, glucose 185 proBNP of 414 UA is negative Lipase 28  Admission Imaging Studies: Chest x-ray shows stable cardiomegaly.  There is no active cardiopulmonary disease.  There is no airspace disease.  Significant Labs:   Significant Imaging Studies:   Antibiotic Therapy: Anti-infectives (From admission, onward)    None       Procedures:   Consultants: cardiology    ED Course: given 120 mg IV lasix . Urinated about 1000 ml. CXR negative for pulmonary edema.  Review of Systems:  Review of Systems  Constitutional:  Positive for malaise/fatigue.  HENT: Negative.    Eyes: Negative.   Respiratory:  Positive for shortness of breath.   Cardiovascular: Negative.   Gastrointestinal:        Abd bloating  Genitourinary:        Feels like she doesn't make as much urine with demadex   Musculoskeletal: Negative.   Skin: Negative.   Neurological: Negative.   Endo/Heme/Allergies: Negative.   Psychiatric/Behavioral: Negative.    All other systems reviewed and are negative.   Past Medical History:   Diagnosis Date   Afib (HCC) 05/12/2021   Brain tumor (HCC) 03/29/1995   astrocytoma   CHF (congestive heart failure) (HCC)    Cholesterosis    CKD (chronic kidney disease) stage 4, GFR 15-29 ml/min (HCC) 05/13/2021   DM (diabetes mellitus) (HCC) 10/10/2018   Fatty liver    Gastroparesis due to DM (HCC) 05/12/2021   HTN (hypertension) 10/10/2018   Hypothyroidism 10/10/2018   Lipoprotein deficiency    Lung disease    longevity long term   Pancreatitis    Polycystic ovary syndrome    Small intestinal bacterial overgrowth (SIBO) 08/26/2021   Type 2 MI (myocardial infarction) (HCC) 01/19/2023    Past Surgical History:  Procedure Laterality Date   ABDOMINAL SURGERY     pt states during miscarriage got her intestine   BRAIN SURGERY     ESOPHAGOGASTRODUODENOSCOPY N/A 09/01/2023   Procedure: EGD (ESOPHAGOGASTRODUODENOSCOPY);  Surgeon: Abran Norleen SAILOR, MD;  Location: [facility];  Service: Gastroenterology;  Laterality: N/A;   EYE MUSCLE SURGERY Right 03/28/2014   PRESSURE SENSOR/CARDIOMEMS N/A 12/06/2021   Procedure: PRESSURE SENSOR/CARDIOMEMS;  Surgeon: [name] Toribio SAUNDERS, MD;  Location: [facility];  Service: Cardiovascular;  Laterality: N/A;   RIGHT HEART CATH N/A 08/05/2021   Procedure: RIGHT HEART CATH;  Surgeon: [name] Toribio SAUNDERS, MD;  Location: [facility];  Service: Cardiovascular;  Laterality: N/A;   RIGHT HEART CATH N/A 12/06/2021   Procedure: RIGHT HEART CATH;  Surgeon: [name] Toribio SAUNDERS, MD;  Location: [facility];  Service: Cardiovascular;  Laterality: N/A;   RIGHT HEART CATH N/A 12/13/2023   Procedure: RIGHT HEART CATH;  Surgeon: Zenaida Morene PARAS, MD;  Location: [facility];  Service: Cardiovascular;  Laterality: N/A;   RIGHT/LEFT HEART CATH AND CORONARY ANGIOGRAPHY N/A 12/03/2020   Procedure: RIGHT/LEFT HEART CATH AND CORONARY ANGIOGRAPHY;  Surgeon: Ladona Heinz, MD;  Location: [facility];  Service: Cardiovascular;  Laterality: N/A;    VENTRICULOSTOMY  03/28/1997     reports that she has never smoked. She has never used smokeless tobacco. She reports that she does not drink alcohol  and does not use drugs.  Allergies[1]  Family History  Problem Relation Age of Onset   Diabetes Mother    Hypertension Mother    Hyperlipidemia Mother    Thyroid  disease Mother    Hypertension Father    Diabetes Father    Pancreatic cancer Paternal Aunt    Pancreatic cancer Paternal Uncle    Colon cancer Neg Hx    Esophageal cancer Neg Hx    Stomach cancer Neg Hx     Prior to Admission medications  Medication Sig Start Date End Date Taking? Authorizing Provider  albuterol  (VENTOLIN  HFA) 108 (90 Base) MCG/ACT inhaler Inhale 2 puffs into the lungs daily as needed for wheezing or shortness of breath. 10/03/23   [provider]  allopurinol  (ZYLOPRIM ) 100 MG tablet Take 1 tablet (100  mg total) by mouth daily. 02/21/24   [name], Toribio SAUNDERS, MD  amitriptyline  (ELAVIL ) 10 MG tablet Take 10 mg by mouth. Patient taking differently: Take 10 mg by mouth at bedtime. 02/01/24   [provider]  aspirin  EC 81 MG tablet Take 1 tablet (81 mg total) by mouth daily. Swallow whole. 12/18/23   [name], Toribio SAUNDERS, MD  atorvastatin  (LIPITOR ) 80 MG tablet Take 1 tablet (80 mg total) by mouth every evening. 02/25/24   Regalado, Belkys A, MD  azelastine  (ASTELIN ) 0.1 % nasal spray Place 2 sprays into both nostrils daily as needed for rhinitis or allergies.    [provider]  BAQSIMI ONE PACK 3 MG/DOSE POWD Place 1 Dose into the nose once as needed (hypoglycemia). 07/31/23   [provider]  Biotin 89999 MCG TABS Take 1 tablet by mouth daily.    [provider]  calcitRIOL  (ROCALTROL ) 0.25 MCG capsule Take 0.25 mcg by mouth daily. 08/04/23   [provider]  dicyclomine  (BENTYL ) 20 MG tablet Take 1 tablet (20 mg total) by mouth 2 (two) times daily. 09/05/23   Dreama Longs, MD  doxazosin  (CARDURA ) 4 MG tablet  Take 1 tablet (4 mg total) by mouth daily. 02/28/24   Colletta Manuelita Garre, PA-C  DULoxetine  (CYMBALTA ) 60 MG capsule Take 60 mg by mouth at bedtime. 09/26/22   [provider]  ergocalciferol  (VITAMIN D2) 1.25 MG (50000 UT) capsule Take 1 capsule (50,000 Units total) by mouth every Sunday. 11/12/23   Lee, Jordan, NP  Glucose Blood (BLOOD GLUCOSE TEST STRIPS) STRP 1 each by Does not apply route 3 (three) times daily. Use as directed to check blood sugar. May dispense any manufacturer covered by patient's insurance and fits patient's device. 08/26/23   Shalhoub, Zachary PARAS, MD  HUMALOG  KWIKPEN 100 UNIT/ML KwikPen Inject 60 Units into the skin in the morning, at noon, and at bedtime. Patient taking differently: Inject 60 Units into the skin in the morning, at noon, and at bedtime.    [provider]  HUMULIN  R U-500 KWIKPEN 500 UNIT/ML KwikPen Inject 300 Units into the skin in the morning and at bedtime. Patient taking differently: Inject 150 Units into the skin in the morning and at bedtime.    [provider]  hydrALAZINE  (APRESOLINE ) 100 MG tablet Take 1 tablet (100 mg total) by mouth every 8 (eight) hours. 01/02/24   Arrien, Elidia Toribio, MD  Insulin  Pen Needle (PEN NEEDLES) 31G X 5 MM MISC Use as directed 3 (three) times daily. 08/26/23   Shalhoub, Zachary PARAS, MD  isosorbide  mononitrate (IMDUR ) 60 MG 24 hr tablet Take 1 tablet (60 mg total) by mouth daily. 01/03/24   Arrien, Mauricio Daniel, MD  ivabradine  (CORLANOR ) 7.5 MG TABS tablet Take 1 tablet (7.5 mg total) by mouth 2 (two) times daily with a meal. 01/08/24   Milford, Harlene HERO, FNP  Lancet Device MISC 1 each by Does not apply route 3 (three) times daily. May dispense any manufacturer covered by patient's insurance. 08/26/23   Shalhoub, Zachary PARAS, MD  Lancets MISC 1 each by Does not apply route 3 (three) times daily. Use as directed to check blood sugar. May dispense any manufacturer covered by patient's insurance and fits  patient's device. 08/26/23   Shalhoub, Zachary PARAS, MD  lansoprazole  (PREVACID ) 30 MG capsule TAKE 1 CAPSULE (30 MG TOTAL) BY MOUTH 2 (TWO) TIMES DAILY BEFORE A MEAL. 02/05/24   May, Deanna J, NP  levocetirizine (XYZAL ) 5 MG tablet Take  5 mg by mouth at bedtime. 03/05/22   [provider]  levothyroxine  (SYNTHROID ) 150 MCG tablet Take 150 mcg by mouth daily before breakfast.    [provider]  methylPREDNISolone  (MEDROL  DOSEPAK) 4 MG TBPK tablet Take as directed Patient not taking: Reported on 03/25/2024 02/08/24   Tobie Franky SQUIBB, DPM  [place] Sodium (TRYNGOLZA) 80 MG/0.8ML SOAJ Inject 1 Units into the skin every 30 (thirty) days.    [provider]  pregabalin  (LYRICA ) 300 MG capsule Take 300 mg by mouth at bedtime.    [provider]  promethazine  (PHENERGAN ) 25 MG tablet Take 25 mg by mouth at bedtime.    [provider]  Safety Seal Miscellaneous MISC Hormonic Hair Solution with minoxidil USP 7% and finasteride USP 0.05% - apply to affected areas daily every morning. 06/06/23   Alm Delon SAILOR, DO  SUMAtriptan  (IMITREX ) 100 MG tablet Take 100 mg by mouth daily as needed. Patient not taking: Reported on 03/25/2024 10/11/23   [provider]  torsemide  (DEMADEX ) 20 MG tablet Take 3 tablets (60 mg total) by mouth daily. 02/28/24   Regalado, Belkys A, MD  tretinoin  (RETIN-A ) 0.05 % cream Apply 1 Application topically at bedtime.    [provider]    Physical Exam: Vitals:   03/30/24 1100 03/30/24 1200 03/30/24 1300 03/30/24 1400  BP: (!) 154/87 (!) 154/84 (!) 167/94 (!) 187/105  Pulse: (!) 101 (!) 108 (!) 113 (!) 116  Resp: 17 19 (!) 22 19  Temp: 98.5 F (36.9 C)     TempSrc: Oral     SpO2: 97% 94% 96% 96%  Weight:      Height:        Physical Exam Vitals and nursing note reviewed.  Constitutional:      General: [name] not in acute distress.    Appearance: [name] obese.     Comments: Chronically ill-appearing  HENT:      Head: Normocephalic and atraumatic.  Eyes:     General: No scleral icterus. Cardiovascular:     Rate and Rhythm: Regular rhythm. Tachycardia present.  Pulmonary:     Effort: Pulmonary effort is normal. No respiratory distress.     Comments: Diminished breath sounds bilaterally but no wheezing or rales. Abdominal:     General: Abdomen is protuberant. Bowel sounds are normal.     Palpations: Abdomen is soft.     Tenderness: There is no abdominal tenderness.     Comments: No flank edema.  Musculoskeletal:     Right lower leg: No edema.     Left lower leg: No edema.     Comments: No lower extremity edema bilaterally.  Skin:    General: Skin is warm and dry.     Capillary Refill: Capillary refill takes less than 2 seconds.  Neurological:     Mental Status: [name] oriented to person, place, and time.      Labs on Admission: I have personally reviewed following labs and imaging studies  CBC: Recent Labs  Lab 03/29/24 1728 03/30/24 0720  WBC 7.9 7.4  NEUTROABS  --  4.4  HGB 9.6* 9.1*  HCT 28.9* 27.4*  MCV 92.3 92.6  PLT 111* 104*   Basic Metabolic Panel: Recent Labs  Lab 03/29/24 1728 03/30/24 0720  NA 139 136  K 3.9 4.6  CL 98 96*  CO2 22 24  GLUCOSE 185* 350*  BUN 76* 76*  CREATININE 2.76* 2.99*  CALCIUM  9.4 9.4  MG  --  1.9   GFR: Estimated Creatinine Clearance: 22 mL/min (A) (by C-G formula based on SCr of 2.99 mg/dL (H)). Liver Function Tests: Recent Labs  Lab 03/30/24 0720  AST 101*  ALT 252*  ALKPHOS 262*  BILITOT 0.6  PROT 7.9  ALBUMIN  4.5   Recent Labs  Lab 03/29/24 0012  LIPASE 28   ProBNP, BNP (last 5 results) Recent Labs    11/10/23 1238 11/10/23 1627 12/07/23 0018 12/28/23 1425 02/07/24 1558 02/22/24 2050 02/28/24 1255 03/29/24 2200  PROBNP  --   --  433.0*  --   --  685.0*  --  414.0*  BNP 27.7 20.9  --  37.4 38.1  --  77.1  --    CBG: Recent Labs  Lab 03/29/24 2236 03/30/24 1023 03/30/24 1142 03/30/24 1257  GLUCAP  138* 312* 329* 333*       Component Value Date/Time   COLORURINE STRAW (A) 03/29/2024 2301   APPEARANCEUR CLEAR 03/29/2024 2301   LABSPEC 1.020 03/29/2024 2301   PHURINE 5.5 03/29/2024 2301   GLUCOSEU NEGATIVE 03/29/2024 2301   HGBUR NEGATIVE 03/29/2024 2301   BILIRUBINUR NEGATIVE 03/29/2024 2301   KETONESUR NEGATIVE 03/29/2024 2301   PROTEINUR NEGATIVE 03/29/2024 2301   NITRITE NEGATIVE 03/29/2024 2301   LEUKOCYTESUR NEGATIVE 03/29/2024 2301    Radiological Exams on Admission: I have personally reviewed images DG Chest 2 View Result Date: 03/29/2024 CLINICAL DATA:  Shortness of breath EXAM: CHEST - 2 VIEW COMPARISON:  02/22/2024, 12/07/2023 FINDINGS: Mild cardiomegaly. Stable cardiac monitoring device. No acute airspace disease, pleural effusion or pneumothorax IMPRESSION: No active cardiopulmonary disease. Mild cardiomegaly. Electronically Signed   By: Luke Bun M.D.   On: 03/29/2024 18:54    EKG: My personal interpretation of EKG shows: NSR     Assessment/Plan Principal Problem:   Acute on chronic combined systolic and diastolic CHF (congestive heart failure) (HCC) Active Problems:   CKD (chronic kidney disease) stage 4, GFR 15-29 ml/min (HCC) - baseline scr 2.8-3.0   Drug-seeking behavior   Primary hypertension   Elevated liver enzymes   Pulmonary hypertension (HCC)   Obesity, Class I, BMI 30-34.9   Lipoprotein lipase deficiency, familial   Hyperglycemia in setting of T2DM    Assessment and Plan: * Acute on chronic combined systolic and diastolic CHF (congestive heart failure) (HCC) 03/30/2024 assigned to observation telemetry bed.  I suspect her acute exacerbation of her chronic CHF is due to noncompliance.  She has not gotten her CardioMEMS device checked out by the manufacture since she first reported it to her CHF clinic provider on February 28, 2024.  As of March 25, 2024 when she was seen by her [facility] provider, she still has not yet called the device  company.  Patient states that she is taking her to furosemide .  Her last para medicine visit was on March 25, 2024 where she weighed 153 pounds.  In the last 4 days since she was seen, she has gained an additional 3 pounds despite saying that she took an extra dose of torsemide  the day of repair medicine visit.  She was really given 120 mg of IV Lasix  at the [facility] ER.  She has documented about 1000 cc of urine output.  She states that she has been on Demadex  for quite a while now.  Not sure if she is ever been changed to Bumex  before.  I think in the future, she will need a predischarge BNP and proBNP to be drawn so that we have lab  values when she is euvolemic.  proBNP and BNP level should be drawn since different facilities within [facility] use the different lab tests.  Her dry weight should be somewhere close to 151 to 152 pounds.  She only needs about 4 to 5 pounds to be diuresed.  [name] ready had 1000 cc of urine output yesterday.  [name] under observation status.  When I walked in the room, her nasal cannula was around her neck and her saturations were 95% on room air.  In review of her chart, she is not on a beta-blocker due to her history of RV dysfunction.  Her echo in November 2025 showed a normal RV function.  This may need to be revisited to see if her guideline directed medical therapy can be titrated better.  CHF consult pending.  Will give her 3 mg of IV Bumex  x 1 and monitor her urine output, serum creatinine.  [name] ready urinated about 1000 mL since yesterday which is about 2.2 pounds.  She only has about 4-5 pounds in diuresis to lose.  Drug-seeking behavior 03/30/2024 during our discussion and interview, the patient asked specifically for IV Dilaudid .  She had ready eaten several meals here in the ER.  Her request for IV narcotics were declined.  There is no indication for IV or PO opiates.   CKD (chronic kidney disease) stage 4, GFR 15-29 ml/min (HCC) - baseline scr  2.8-3.0 03/30/2024 baseline scr 2.8-3.0.  Monitor her serum creatinine during diuresis.   Hyperglycemia in setting of T2DM 03/30/2024 patient remains on insulin  for her hyperglycemia associated with her pancreatic insufficiency related diabetes.  A1c is pending today.  Her last A1c was drawn in July 2025 of 8.8%.  Lipoprotein lipase deficiency, familial 03/30/2024 has elevated LFTs long term for last 2 years. Will continue lipitor  and monitor her LFTs.   Obesity, Class I, BMI 30-34.9 03/30/2024 Body mass index is 33.97 kg/m.    Pulmonary hypertension (HCC) 03/30/2024 her right heart catheterization in September 2005 showed normal RV filling pressures.  With a wedge pressure of 13 mmHg.  However her cardiac output was low at 2.8 L/min.  I think CHF consult will need to reevaluate whether or not patient can tolerate beta-blockers or not.   Elevated liver enzymes 03/30/2024 patient has persistently elevated LFTs.  This has been ongoing for at least the last 2 years.   Primary hypertension 03/30/2024 patient remains on hydralazine  100 mg 3 times daily, Cardura  4 mg daily.  Will reorder her Imdur  60 mg daily.     DVT prophylaxis: SQ Heparin  Code Status: Full Code Family Communication: pt is decisional. No family at bedside  Disposition Plan: home  Consults called: cardiology  Admission status: Observation, Telemetry bed   Jennifer Door, DO [facility] 03/30/2024, 3:16 PM       [1]  Allergies Allergen Reactions   Icosapent  Ethyl (Epa Ethyl Ester) (Fish) Hives   Ketamine Other (See Comments)    In a vegetative state for 15 minutes, per pt   Maitake Itching and Other (See Comments)    Itchy throat   Morphine  Hives, Itching, Rash and Other (See Comments)    Sore throat, too   Mushroom Other (See Comments)    Pt has never had mushrooms but tested positive on allergy test   Penicillins Hives, Itching and Rash    Tolerates cephalosporins   Shellfish Allergy Other (See Comments)     Pt has never had shellfish, but tested positive on  allergy test. Pt states contrast in CT is okay.   Fentanyl  Nausea And Vomiting   Prednisone Rash   "

## 2024-03-30 NOTE — Progress Notes (Signed)
 Patient admitted earlier today by virtual hospitalist for acute on chronic combined systolic and diastolic CHF.  Patient seen and examined.  She denies any complaints.  Reports that overall her breathing have improved.  Blood pressure noted to be elevated.  RN is about to give  her home medications.  Please see full H&P for details

## 2024-03-31 DIAGNOSIS — Z794 Long term (current) use of insulin: Secondary | ICD-10-CM | POA: Diagnosis not present

## 2024-03-31 DIAGNOSIS — E790 Hyperuricemia without signs of inflammatory arthritis and tophaceous disease: Secondary | ICD-10-CM

## 2024-03-31 DIAGNOSIS — I5043 Acute on chronic combined systolic (congestive) and diastolic (congestive) heart failure: Secondary | ICD-10-CM | POA: Diagnosis not present

## 2024-03-31 DIAGNOSIS — E8770 Fluid overload, unspecified: Secondary | ICD-10-CM | POA: Diagnosis not present

## 2024-03-31 DIAGNOSIS — E1165 Type 2 diabetes mellitus with hyperglycemia: Secondary | ICD-10-CM | POA: Diagnosis not present

## 2024-03-31 LAB — CBC WITH DIFFERENTIAL/PLATELET
Abs Immature Granulocytes: 0.05 K/uL (ref 0.00–0.07)
Basophils Absolute: 0.1 K/uL (ref 0.0–0.1)
Basophils Relative: 1 %
Eosinophils Absolute: 0.2 K/uL (ref 0.0–0.5)
Eosinophils Relative: 2 %
HCT: 30.9 % — ABNORMAL LOW (ref 36.0–46.0)
Hemoglobin: 10.2 g/dL — ABNORMAL LOW (ref 12.0–15.0)
Immature Granulocytes: 1 %
Lymphocytes Relative: 30 %
Lymphs Abs: 2.4 K/uL (ref 0.7–4.0)
MCH: 30.4 pg (ref 26.0–34.0)
MCHC: 33 g/dL (ref 30.0–36.0)
MCV: 92.2 fL (ref 80.0–100.0)
Monocytes Absolute: 0.5 K/uL (ref 0.1–1.0)
Monocytes Relative: 6 %
Neutro Abs: 4.9 K/uL (ref 1.7–7.7)
Neutrophils Relative %: 60 %
Platelets: 130 K/uL — ABNORMAL LOW (ref 150–400)
RBC: 3.35 MIL/uL — ABNORMAL LOW (ref 3.87–5.11)
RDW: 16.3 % — ABNORMAL HIGH (ref 11.5–15.5)
WBC: 8 K/uL (ref 4.0–10.5)
nRBC: 0 % (ref 0.0–0.2)

## 2024-03-31 LAB — COMPREHENSIVE METABOLIC PANEL WITH GFR
ALT: 219 U/L — ABNORMAL HIGH (ref 0–44)
AST: 101 U/L — ABNORMAL HIGH (ref 15–41)
Albumin: 4.5 g/dL (ref 3.5–5.0)
Alkaline Phosphatase: 256 U/L — ABNORMAL HIGH (ref 38–126)
Anion gap: 17 — ABNORMAL HIGH (ref 5–15)
BUN: 77 mg/dL — ABNORMAL HIGH (ref 6–20)
CO2: 24 mmol/L (ref 22–32)
Calcium: 9 mg/dL (ref 8.9–10.3)
Chloride: 96 mmol/L — ABNORMAL LOW (ref 98–111)
Creatinine, Ser: 2.58 mg/dL — ABNORMAL HIGH (ref 0.44–1.00)
GFR, Estimated: 24 mL/min — ABNORMAL LOW
Glucose, Bld: 253 mg/dL — ABNORMAL HIGH (ref 70–99)
Potassium: 4.3 mmol/L (ref 3.5–5.1)
Sodium: 137 mmol/L (ref 135–145)
Total Bilirubin: 0.5 mg/dL (ref 0.0–1.2)
Total Protein: 7.8 g/dL (ref 6.5–8.1)

## 2024-03-31 LAB — GLUCOSE, CAPILLARY
Glucose-Capillary: 383 mg/dL — ABNORMAL HIGH (ref 70–99)
Glucose-Capillary: 429 mg/dL — ABNORMAL HIGH (ref 70–99)
Glucose-Capillary: 307 mg/dL — ABNORMAL HIGH (ref 70–99)
Glucose-Capillary: 218 mg/dL — ABNORMAL HIGH (ref 70–99)
Glucose-Capillary: 144 mg/dL — ABNORMAL HIGH (ref 70–99)

## 2024-03-31 LAB — PRO BRAIN NATRIURETIC PEPTIDE: Pro Brain Natriuretic Peptide: 600 pg/mL — ABNORMAL HIGH

## 2024-03-31 MED ORDER — CEPHALEXIN 500 MG PO CAPS
500.0000 mg | ORAL_CAPSULE | Freq: Two times a day (BID) | ORAL | Status: DC
  Administered 2024-03-31 – 2024-04-01 (×3): 500 mg via ORAL
  Filled 2024-03-31 (×4): qty 1

## 2024-03-31 MED ORDER — TORSEMIDE 20 MG PO TABS
60.0000 mg | ORAL_TABLET | Freq: Every day | ORAL | Status: DC
  Administered 2024-03-31 – 2024-04-01 (×2): 60 mg via ORAL
  Filled 2024-03-31 (×2): qty 3

## 2024-03-31 MED ORDER — INSULIN REGULAR HUMAN (CONC) 500 UNIT/ML ~~LOC~~ SOPN
150.0000 [IU] | PEN_INJECTOR | Freq: Two times a day (BID) | SUBCUTANEOUS | Status: DC
  Administered 2024-03-31 – 2024-04-01 (×2): 150 [IU] via SUBCUTANEOUS
  Filled 2024-03-31: qty 3

## 2024-03-31 MED ORDER — HYDROMORPHONE HCL 1 MG/ML IJ SOLN
0.5000 mg | Freq: Once | INTRAMUSCULAR | Status: AC
  Administered 2024-03-31: 0.5 mg via INTRAVENOUS
  Filled 2024-03-31: qty 0.5

## 2024-03-31 NOTE — Progress Notes (Signed)
 "  Rounding Note   Patient Name: Jennifer [name] Date of Encounter: 03/31/2024  [facility] Cardiologist: [name], MD   Subjective  - No acute events overnight - Patient seen by cardiology fellow and the patient was restarted on her home torsemide  - Currently the patient states that she is at her baseline and feels euvolemic. - She denies chest pain and shortness of breath  Scheduled Meds:  allopurinol   100 mg Oral Daily   aspirin  EC  81 mg Oral Daily   atorvastatin   80 mg Oral QPM   cephALEXin   500 mg Oral Q12H   Chlorhexidine  Gluconate Cloth  6 each Topical Daily   dicyclomine   20 mg Oral BID   doxazosin   4 mg Oral Daily   heparin   5,000 Units Subcutaneous Q8H   hydrALAZINE   100 mg Oral Q8H   insulin  aspart  0-15 Units Subcutaneous TID WC   insulin  regular human CONCENTRATED  150 Units Subcutaneous BID WC   isosorbide  mononitrate  30 mg Oral Daily   ivabradine   7.5 mg Oral BID WC   levothyroxine   150 mcg Oral QAC breakfast   mupirocin  ointment  1 Application Nasal BID   pantoprazole   40 mg Oral Daily   pregabalin   300 mg Oral QHS   torsemide   60 mg Oral Daily   Continuous Infusions:  PRN Meds: acetaminophen  **OR** acetaminophen , cloNIDine    Vital Signs  Vitals:   03/31/24 1209 03/31/24 1227 03/31/24 1420 03/31/24 1618  BP: (!) 182/95 (!) 182/95 (!) 156/71 (!) 148/69  Pulse: 97   87  Resp: 16   15  Temp: 98.7 F (37.1 C)   98.7 F (37.1 C)  TempSrc: Oral   Oral  SpO2: 98%   96%  Weight:      Height:        Intake/Output Summary (Last 24 hours) at 03/31/2024 1648 Last data filed at 03/31/2024 1622 Gross per 24 hour  Intake 542 ml  Output 2300 ml  Net -1758 ml      03/31/2024    5:22 AM 03/30/2024    3:55 PM 03/29/2024    5:17 PM  Last 3 Weights  Weight (lbs) 153 lb 14.4 oz 153 lb 14.1 oz 157 lb  Weight (kg) 69.809 kg 69.8 kg 71.215 kg      Telemetry NSR- Personally Reviewed  ECG  Sinus tachycardia- Personally Reviewed  Physical  Exam  GEN: No acute distress.   Neck: No JVD Cardiac: RRR, no murmurs, rubs, or gallops.  Respiratory: Clear to auscultation bilaterally. GI: Soft, nontender, non-distended  MS: No edema; No deformity. Neuro:  Nonfocal  Psych: Normal affect   Labs High Sensitivity Troponin:  No results for input(s): TROPONINIHS in the last 720 hours.  Recent Labs  Lab 03/29/24 2220 03/30/24 0012 03/30/24 0720  TRNPT 105* 107* 99*       Chemistry Recent Labs  Lab 03/29/24 1728 03/30/24 0720 03/31/24 0122  NA 139 136 137  K 3.9 4.6 4.3  CL 98 96* 96*  CO2 22 24 24   GLUCOSE 185* 350* 253*  BUN 76* 76* 77*  CREATININE 2.76* 2.99* 2.58*  CALCIUM  9.4 9.4 9.0  MG  --  1.9  --   PROT  --  7.9 7.8  ALBUMIN   --  4.5 4.5  AST  --  101* 101*  ALT  --  252* 219*  ALKPHOS  --  262* 256*  BILITOT  --  0.6 0.5  GFRNONAA 23*  21* 24*  ANIONGAP 20* 17* 17*    Lipids No results for input(s): CHOL, TRIG, HDL, LABVLDL, LDLCALC, CHOLHDL in the last 168 hours.  Hematology Recent Labs  Lab 03/29/24 1728 03/30/24 0720 03/31/24 0122  WBC 7.9 7.4 8.0  RBC 3.13* 2.96* 3.35*  HGB 9.6* 9.1* 10.2*  HCT 28.9* 27.4* 30.9*  MCV 92.3 92.6 92.2  MCH 30.7 30.7 30.4  MCHC 33.2 33.2 33.0  RDW 16.7* 16.5* 16.3*  PLT 111* 104* 130*   Thyroid  No results for input(s): TSH, FREET4 in the last 168 hours.  BNP Recent Labs  Lab 03/29/24 2200 03/31/24 0122  PROBNP 414.0* 600.0*    DDimer No results for input(s): DDIMER in the last 168 hours.   Radiology  DG Chest 2 View Result Date: 03/29/2024 CLINICAL DATA:  Shortness of breath EXAM: CHEST - 2 VIEW COMPARISON:  02/22/2024, 12/07/2023 FINDINGS: Mild cardiomegaly. Stable cardiac monitoring device. No acute airspace disease, pleural effusion or pneumothorax IMPRESSION: No active cardiopulmonary disease. Mild cardiomegaly. Electronically Signed   By: Luke Bun M.D.   On: 03/29/2024 18:54    Cardiac Studies   TTE  02/05/24:  IMPRESSIONS     1. Left ventricular ejection fraction, by estimation, is 50 to 55%. The  left ventricle has low normal function. The left ventricle has no regional  wall motion abnormalities. There is mild concentric left ventricular  hypertrophy. Left ventricular  diastolic parameters are consistent with Grade I diastolic dysfunction  (impaired relaxation).   2. Right ventricular systolic function is normal. The right ventricular  size is normal. There is mildly elevated pulmonary artery systolic  pressure. The estimated right ventricular systolic pressure is 43.8 mmHg.   3. The mitral valve is normal in structure. Trivial mitral valve  regurgitation. No evidence of mitral stenosis.   4. The aortic valve is grossly normal. Aortic valve regurgitation is  mild. No aortic stenosis is present.   5. The inferior vena cava is dilated in size with >50% respiratory  variability, suggesting right atrial pressure of 8 mmHg.   Patient Profile   Jennifer [name] is a 34 y.o. female with a hx of mixed ischemic and nonischemic cardiomyopathy with HFimpEF (LVEF as low as 25-30% in 2022 and now recovered to 50-55% in 01/2024), CAD with occluded RCA PH (PA 46/23(31) with PVR 6.4 and PCWP 13 in 9/25), hypertension, hyperlipidemia, severe hypertriglyceridemia due to LDL deficiency, IDDM, gastroparesis, chronic pancreatitis, non alcoholic fatty liver disease, CKD4 (baseline Cr ~2.8), OSA, astrocytoma s/p resection in 1997 who is being seen 03/30/2024 for the evaluation of heart failure exacerbation at the request of Dr. Laurence.   Assessment & Plan    #Acute on Chronic HFimpEF Exacerbation #HTN - Patient admitted with volume overload that was responsive to a few doses of IV diuretics. - Patient now appears to be euvolemic and is asymptomatic. - Restarted on her home torsemide  by the overnight fellow with good urine output. - Will plan for advanced heart failure procedure tomorrow, but if  there is no additional intervention required by then we will likely be a discharge. Advanced CHF to see tomorrow Continue torsemide  60 mg daily Continue Imdur  30 mg daily Continue hydralazine  100 mg to 8h Continue ivabradine  7.5 mg twice daily   #CAD Continue aspirin  81 mg daily  #HLD Continue atorvastatin  80 mg daily     For questions or updates, please contact [facility] Please consult [email or web address] for contact info under       Signed,  Georganna Archer, MD  03/31/2024, 4:48 PM    "

## 2024-03-31 NOTE — Progress Notes (Signed)
 "          [facility]                                                                              Jennifer [name], is a 34 y.o. female, DOB - [birth year], FMW:[id number] Admit date - 03/29/2024    Outpatient Primary MD for the patient is Emilio Joesph DEL, PA-C  LOS - 0  days  Chief Complaint  Patient presents with   Abdominal Pain       Brief summary   Patient is a 34 year old female with chronic systolic and diastolic CHF, EF 50 to 55%, chronic pulmonary hypertension, not on home O2, on diabetes mellitus due to pancreatic insufficiency, obesity, CKD stage IV with frequent hospitalizations presented to ED with abdominal bloating.[name] currently enrolled in the para- medicine CHF program.  And was last seen on March 25, 2024.  Her weight during that visit was 69.4 kg. Her dry weight is normally around 151 to 152 pounds.  She presented to the ER today with a weight of 71.2 kg or 156.6 pounds.  Patient reports compliance with her medications. She does have a CardioMEMS device. But reported to her CHF clinic appointment on December 3rd that is was not working.  proBNP 414.  Patient was admitted for further care, consulted.  Assessment & Plan       Acute on chronic combined systolic and diastolic CHF (congestive heart failure) (HCC) with improved EF, nonischemic cardiomyopathy, moderate pulmonary hypertension - Patient received Lasix  IV 120 mg x 1 and Bumex  3 mg IV x 1, negative balance of 2.4 L - Creatinine improving - Feels closer to her baseline, CHF team to follow -proBNP 414 on admission, increased to 600 today.  Troponin 105-> 107-> 99 - Per cardiology evaluation yesterday, recommended to hold off on further IV diuresis and restart home torsemide  60 mg daily.  Continue Corlanor . - Continue hydralazine , Imdur     CKD (chronic kidney disease) stage 4, GFR 15-29 ml/min (HCC) - baseline scr 2.8-3.0 -Creatinine at baseline.  Continue to monitor with diuresis.  Diabetes  mellitus type 2, uncontrolled with hyperglycemia - Hemoglobin A1c 6.6 CBG (last 3)  Recent Labs    03/30/24 2050 03/31/24 0551 03/31/24 0732  GLUCAP 212* 383* 429*   Currently on Lantus  50 units twice a day, NovoLog  20 units 3 times daily, moderate SSI - Diabetic coordinator following     Primary hypertension -Continue torsemide , Imdur , hydralazine , Cardura     Elevated liver enzymes -Has persistently elevated LFTs for last 2 years.  Vaginal cyst - Per patient, has a history of recurrent vaginal cyst and treated by PCP, has needed with drainage and p.o. antibiotics.   - Placed on p.o. Keflex  x 5 days.  Recommended follow-up with GYN or surgery outpatient, will need to referral from PCP.    Obesity, Class I, BMI 30-34.9 Estimated body mass index is 33.3 kg/m as calculated from the following:   Height as of this encounter: 4' 9 (1.448 m).   Weight as of this encounter: 69.8 kg.  Code Status: Full code DVT Prophylaxis:  heparin  injection 5,000 Units Start: 03/30/24 1730 SCDs Start: 03/30/24 1631   Level of  Care: Level of care: Telemetry Family Communication: Updated patient Disposition Plan:      Remains inpatient appropriate: Pending cardiology evaluation and clearance   Procedures:    Consultants:   Cardiology  Antimicrobials:   Anti-infectives (From admission, onward)    None          Medications  allopurinol   100 mg Oral Daily   aspirin  EC  81 mg Oral Daily   atorvastatin   80 mg Oral QPM   Chlorhexidine  Gluconate Cloth  6 each Topical Daily   dicyclomine   20 mg Oral BID   doxazosin   4 mg Oral Daily   heparin   5,000 Units Subcutaneous Q8H   hydrALAZINE   100 mg Oral Q8H   insulin  aspart  0-15 Units Subcutaneous TID WC   insulin  aspart  20 Units Subcutaneous TID WC   insulin  glargine  50 Units Subcutaneous BID   isosorbide  mononitrate  30 mg Oral Daily   ivabradine   7.5 mg Oral BID WC   levothyroxine   150 mcg Oral QAC breakfast   mupirocin   ointment  1 Application Nasal BID   pantoprazole   40 mg Oral Daily   pregabalin   300 mg Oral QHS      Subjective:   Jennifer [name] was seen and examined today.  BP uncontrolled, sitting upright, states feels better and closer to her baseline.  She feels that she needs IV Lasix  and wondering if para medicine can give her IV Lasix  at home.  Currently no acute chest pain, shortness of breath, abdominal pain nausea vomiting.   Objective:   Vitals:   03/31/24 0322 03/31/24 0522 03/31/24 0730 03/31/24 1015  BP: (!) 167/94  (!) 166/89 (!) 182/95  Pulse: 98  98   Resp:   18   Temp:   98.3 F (36.8 C)   TempSrc:   Oral   SpO2: 91%  96%   Weight:  69.8 kg    Height:        Intake/Output Summary (Last 24 hours) at 03/31/2024 1025 Last data filed at 03/31/2024 0500 Gross per 24 hour  Intake 542 ml  Output 1850 ml  Net -1308 ml     Wt Readings from Last 3 Encounters:  03/31/24 69.8 kg  03/25/24 69.4 kg  03/18/24 70.3 kg     Exam General: Alert and oriented x 3, NAD Cardiovascular: S1 S2 auscultated,  RRR Respiratory: Diminished breath sounds at the bases, no wheezing  Gastrointestinal: Soft, nontender, nondistended, + bowel sounds Ext: no pedal edema bilaterally Neuro: No new deficits Psych: Normal affect     Data Reviewed:  I have personally reviewed following labs    CBC Lab Results  Component Value Date   WBC 8.0 03/31/2024   RBC 3.35 (L) 03/31/2024   HGB 10.2 (L) 03/31/2024   HCT 30.9 (L) 03/31/2024   MCV 92.2 03/31/2024   MCH 30.4 03/31/2024   PLT 130 (L) 03/31/2024   MCHC 33.0 03/31/2024   RDW 16.3 (H) 03/31/2024   LYMPHSABS 2.4 03/31/2024   MONOABS 0.5 03/31/2024   EOSABS 0.2 03/31/2024   BASOSABS 0.1 03/31/2024     Last metabolic panel Lab Results  Component Value Date   NA 137 03/31/2024   K 4.3 03/31/2024   CL 96 (L) 03/31/2024   CO2 24 03/31/2024   BUN 77 (H) 03/31/2024   CREATININE 2.58 (H) 03/31/2024   GLUCOSE 253 (H) 03/31/2024    GFRNONAA 24 (L) 03/31/2024   GFRAA 68 08/22/2019   CALCIUM  9.0 03/31/2024  PHOS 6.4 (H) 12/08/2023   PROT 7.8 03/31/2024   ALBUMIN  4.5 03/31/2024   BILITOT 0.5 03/31/2024   ALKPHOS 256 (H) 03/31/2024   AST 101 (H) 03/31/2024   ALT 219 (H) 03/31/2024   ANIONGAP 17 (H) 03/31/2024    CBG (last 3)  Recent Labs    03/30/24 2050 03/31/24 0551 03/31/24 0732  GLUCAP 212* 383* 429*      Coagulation Profile: No results for input(s): INR, PROTIME in the last 168 hours.   Radiology Studies: I have personally reviewed the imaging studies  DG Chest 2 View Result Date: 03/29/2024 CLINICAL DATA:  Shortness of breath EXAM: CHEST - 2 VIEW COMPARISON:  02/22/2024, 12/07/2023 FINDINGS: Mild cardiomegaly. Stable cardiac monitoring device. No acute airspace disease, pleural effusion or pneumothorax IMPRESSION: No active cardiopulmonary disease. Mild cardiomegaly. Electronically Signed   By: Luke Bun M.D.   On: 03/29/2024 18:54       Kierstan Auer M.D. [facility] 03/31/2024, 10:25 AM  Available via Epic secure chat 7am-7pm After 7 pm, please refer to night coverage provider listed on amion.    "

## 2024-03-31 NOTE — Plan of Care (Signed)

## 2024-03-31 NOTE — Care Management Obs Status (Signed)
 MEDICARE OBSERVATION STATUS NOTIFICATION   Patient Details  Name: Jennifer [name] MRN: [id number] Date of Birth: 10/22/1990   Medicare Observation Status Notification Given:  Yes    Robynn Eileen Hoose, RN 03/31/2024, 8:49 AM

## 2024-03-31 NOTE — Care Management CC44 (Signed)
"         Condition Code 44 Documentation Completed  Patient Details  Name: Jennifer [name] MRN: [id number] Date of Birth: May 31, 1990   Condition Code 44 given:  Yes Patient signature on Condition Code 44 notice:  Yes Documentation of 2 MD's agreement:  Yes Code 44 added to claim:  Yes    Robynn Eileen Hoose, RN 03/31/2024, 8:49 AM  "

## 2024-04-01 ENCOUNTER — Other Ambulatory Visit (HOSPITAL_COMMUNITY): Payer: Self-pay

## 2024-04-01 ENCOUNTER — Telehealth (HOSPITAL_COMMUNITY): Payer: Self-pay

## 2024-04-01 DIAGNOSIS — E1165 Type 2 diabetes mellitus with hyperglycemia: Secondary | ICD-10-CM | POA: Diagnosis not present

## 2024-04-01 DIAGNOSIS — Z794 Long term (current) use of insulin: Secondary | ICD-10-CM

## 2024-04-01 DIAGNOSIS — I5043 Acute on chronic combined systolic (congestive) and diastolic (congestive) heart failure: Secondary | ICD-10-CM | POA: Diagnosis not present

## 2024-04-01 DIAGNOSIS — E8770 Fluid overload, unspecified: Secondary | ICD-10-CM | POA: Diagnosis not present

## 2024-04-01 LAB — BASIC METABOLIC PANEL WITH GFR
Anion gap: 16 — ABNORMAL HIGH (ref 5–15)
BUN: 82 mg/dL — ABNORMAL HIGH (ref 6–20)
CO2: 24 mmol/L (ref 22–32)
Calcium: 9 mg/dL (ref 8.9–10.3)
Chloride: 94 mmol/L — ABNORMAL LOW (ref 98–111)
Creatinine, Ser: 2.78 mg/dL — ABNORMAL HIGH (ref 0.44–1.00)
GFR, Estimated: 22 mL/min — ABNORMAL LOW
Glucose, Bld: 259 mg/dL — ABNORMAL HIGH (ref 70–99)
Potassium: 4.2 mmol/L (ref 3.5–5.1)
Sodium: 134 mmol/L — ABNORMAL LOW (ref 135–145)

## 2024-04-01 LAB — GLUCOSE, CAPILLARY
Glucose-Capillary: 229 mg/dL — ABNORMAL HIGH (ref 70–99)
Glucose-Capillary: 199 mg/dL — ABNORMAL HIGH (ref 70–99)

## 2024-04-01 MED ORDER — ISOSORBIDE MONONITRATE ER 60 MG PO TB24
60.0000 mg | ORAL_TABLET | Freq: Every day | ORAL | 3 refills | Status: AC
  Filled 2024-04-01: qty 30, 30d supply, fill #0

## 2024-04-01 MED ORDER — CEPHALEXIN 500 MG PO CAPS
500.0000 mg | ORAL_CAPSULE | Freq: Two times a day (BID) | ORAL | 0 refills | Status: AC
  Filled 2024-04-01: qty 10, 5d supply, fill #0

## 2024-04-01 MED ORDER — HYDRALAZINE HCL 100 MG PO TABS
100.0000 mg | ORAL_TABLET | Freq: Three times a day (TID) | ORAL | 3 refills | Status: AC
  Filled 2024-04-01: qty 90, 30d supply, fill #0

## 2024-04-01 NOTE — Plan of Care (Signed)
  Problem: Education: Goal: Knowledge of General Education information will improve Description: Including pain rating scale, medication(s)/side effects and non-pharmacologic comfort measures Outcome: Progressing   Problem: Clinical Measurements: Goal: Diagnostic test results will improve Outcome: Progressing Goal: Respiratory complications will improve Outcome: Progressing   Problem: Activity: Goal: Risk for activity intolerance will decrease Outcome: Progressing   Problem: Pain Managment: Goal: General experience of comfort will improve and/or be controlled Outcome: Progressing

## 2024-04-01 NOTE — Telephone Encounter (Signed)
 Called to confirm/remind patient of their appointment [facility] on [facility].   Appointment:   [x] Confirmed  [] Left mess   [] No answer/No voice mail  [] VM Full/unable to leave message  [] Phone not in service  Patient reminded to bring all medications and/or complete list.  Confirmed patient has transportation. Gave directions, instructed to utilize valet parking.

## 2024-04-01 NOTE — Discharge Summary (Signed)
 " Physician Discharge Summary   Patient: Jennifer [name] MRN: [id number] DOB: 03/28/1991  Admit date:     03/29/2024  Discharge date: 04/01/2024  Discharge Physician: Nydia Distance, MD    PCP: Emilio Joesph DEL, PA-C   Recommendations at discharge:   Counseled on strict compliance with medications. Keflex  500 mg p.o. twice daily for 5 days.  Recommended with PCP and outpatient referral to surgery for ankle and vaginal cyst Continue torsemide  60 milligram twice daily, follow outpatient with cardiology  Discharge Diagnoses:    Acute on chronic combined systolic and diastolic CHF (congestive heart failure) (HCC)   CKD (chronic kidney disease) stage 4, GFR 15-29 ml/min (HCC) - baseline scr 2.8-3.0   Drug-seeking behavior   Primary hypertension   Elevated liver enzymes   Pulmonary hypertension (HCC)   Obesity, Class I, BMI 30-34.9   Lipoprotein lipase deficiency, familial   Hyperglycemia in setting of T2DM    Hospital Course:  Patient is a 34 year old female with chronic systolic and diastolic CHF, EF 50 to 55%, chronic pulmonary hypertension, not on home O2, on diabetes mellitus due to pancreatic insufficiency, obesity, CKD stage IV with frequent hospitalizations presented to ED with abdominal bloating.[name] currently enrolled in the para- medicine CHF program.  And was last seen on March 25, 2024.  Her weight during that visit was 69.4 kg. Her dry weight is normally around 151 to 152 pounds.  She presented to the ER today with a weight of 71.2 kg or 156.6 pounds.  Patient reports compliance with her medications. She does have a CardioMEMS device. But reported to her CHF clinic appointment on December 3rd that is was not working.  proBNP 414.  Patient was admitted for further care, cardiology consulted.   Assessment and Plan:  Acute on chronic combined systolic and diastolic CHF (congestive heart failure) (HCC) with improved EF, nonischemic cardiomyopathy, moderate pulmonary  hypertension - Patient received Lasix  IV 120 mg x 1 and Bumex  3 mg IV x 1, negative balance of 3.4 L - Creatinine stable - Troponin 105-> 107-> 99 - Per cardiology evaluation yesterday, recommended to hold off on further IV diuresis and restart home torsemide  60 mg daily.  Continue Corlanor . - Continue hydralazine , Imdur  -Patient is cleared to be discharged home per cardiology.  Outpatient follow-up with heart failure     CKD (chronic kidney disease) stage 4, GFR 15-29 ml/min (HCC) - baseline scr 2.8-3.0 -Creatinine at baseline.   - Continue to monitor with diuresis.   Diabetes mellitus type 2, uncontrolled with hyperglycemia - Hemoglobin A1c 6.6 - Continue outpatient insulin  regimen       Primary hypertension -Continue torsemide , Imdur , hydralazine , Cardura      Elevated liver enzymes -Has persistently elevated LFTs for last 2 years.   Vaginal cyst - Per patient, has a history of recurrent vaginal cyst and treated by PCP, has needed with drainage and p.o. antibiotics.   - Placed on p.o. Keflex  x 5 days.  Recommended follow-up with GYN or surgery outpatient, will need to referral from PCP.     Obesity, Class I, BMI 30-34.9 Estimated body mass index is 33.3 kg/m as calculated from the following:   Height as of this encounter: 4' 9 (1.448 m).   Weight as of this encounter: 69.8 kg.    Pain control - [place]  Controlled Substance Reporting System database was reviewed. and patient was instructed, not to drive, operate heavy machinery, perform activities at heights, swimming or participation in water  activities or provide baby-sitting  services while on Pain, Sleep and Anxiety Medications; until their outpatient Physician has advised to do so again. Also recommended to not to take more than prescribed Pain, Sleep and Anxiety Medications.  Consultants: Cardiology Procedures performed: None Disposition: Home Diet recommendation: Carb modified diet, low-sodium  DISCHARGE  MEDICATION: Allergies as of 04/01/2024       Reactions   Icosapent  Ethyl (epa Ethyl Ester) (fish) Hives   Ketamine Other (See Comments)   In a vegetative state for 15 minutes, per pt   [place] Itching, Other (See Comments)   Itchy throat   Morphine  Hives, Itching, Rash, Other (See Comments)   Sore throat, too   Mushroom Other (See Comments)   Pt has never had mushrooms but tested positive on allergy test   Penicillins Hives, Itching, Rash   Tolerates cephalosporins   Shellfish Allergy Other (See Comments)   Pt has never had shellfish, but tested positive on allergy test. Pt states contrast in CT is okay.   Fentanyl  Nausea And Vomiting   Prednisone Rash        Medication List     TAKE these medications    albuterol  108 (90 Base) MCG/ACT inhaler Commonly known as: VENTOLIN  HFA Inhale 2 puffs into the lungs daily as needed for wheezing or shortness of breath.   allopurinol  100 MG tablet Commonly known as: Zyloprim  Take 1 tablet (100 mg total) by mouth daily.   amitriptyline  10 MG tablet Commonly known as: ELAVIL  Take 10 mg by mouth. What changed: when to take this   aspirin  EC 81 MG tablet Take 1 tablet (81 mg total) by mouth daily. Swallow whole.   atorvastatin  80 MG tablet Commonly known as: LIPITOR  Take 1 tablet (80 mg total) by mouth every evening.   azelastine  0.1 % nasal spray Commonly known as: ASTELIN  Place 2 sprays into both nostrils daily as needed for rhinitis or allergies.   Baqsimi One Pack 3 MG/DOSE Powd Generic drug: Glucagon Place 1 Dose into the nose once as needed (hypoglycemia).   Biotin 10000 MCG Tabs Take 1 tablet by mouth daily.   BLOOD GLUCOSE TEST STRIPS Strp 1 each by Does not apply route 3 (three) times daily. Use as directed to check blood sugar. May dispense any manufacturer covered by patient's insurance and fits patient's device.   calcitRIOL  0.25 MCG capsule Commonly known as: ROCALTROL  Take 0.25 mcg by mouth daily.    cephALEXin  500 MG capsule Commonly known as: KEFLEX  Take 1 capsule (500 mg total) by mouth 2 (two) times daily for 5 days.   dicyclomine  20 MG tablet Commonly known as: BENTYL  Take 1 tablet (20 mg total) by mouth 2 (two) times daily.   doxazosin  4 MG tablet Commonly known as: CARDURA  Take 1 tablet (4 mg total) by mouth daily.   DULoxetine  60 MG capsule Commonly known as: CYMBALTA  Take 60 mg by mouth at bedtime.   ergocalciferol  1.25 MG (50000 UT) capsule Commonly known as: VITAMIN D2 Take 1 capsule (50,000 Units total) by mouth every Sunday.   HumaLOG  KwikPen 100 UNIT/ML KwikPen Generic drug: insulin  lispro Inject 60 Units into the skin in the morning, at noon, and at bedtime.   HumuLIN  R U-500 KwikPen 500 UNIT/ML KwikPen Generic drug: insulin  regular human CONCENTRATED Inject 300 Units into the skin in the morning and at bedtime. What changed: how much to take   hydrALAZINE  100 MG tablet Commonly known as: APRESOLINE  Take 1 tablet (100 mg total) by mouth every 8 (eight) hours. What changed:  when to take this   isosorbide  mononitrate 60 MG 24 hr tablet Commonly known as: IMDUR  Take 1 tablet (60 mg total) by mouth daily.   ivabradine  7.5 MG Tabs tablet Commonly known as: CORLANOR  Take 1 tablet (7.5 mg total) by mouth 2 (two) times daily with a meal.   Lancet Device Misc 1 each by Does not apply route 3 (three) times daily. May dispense any manufacturer covered by patient's insurance.   Lancets Misc 1 each by Does not apply route 3 (three) times daily. Use as directed to check blood sugar. May dispense any manufacturer covered by patient's insurance and fits patient's device.   lansoprazole  30 MG capsule Commonly known as: PREVACID  TAKE 1 CAPSULE (30 MG TOTAL) BY MOUTH 2 (TWO) TIMES DAILY BEFORE A MEAL.   levocetirizine 5 MG tablet Commonly known as: XYZAL  Take 5 mg by mouth at bedtime.   levothyroxine  150 MCG tablet Commonly known as: SYNTHROID  Take 150 mcg  by mouth daily before breakfast.   Pen Needles 31G X 5 MM Misc Use as directed 3 (three) times daily.   pregabalin  300 MG capsule Commonly known as: LYRICA  Take 300 mg by mouth at bedtime.   promethazine  25 MG tablet Commonly known as: PHENERGAN  Take 25 mg by mouth at bedtime.   Safety Seal Miscellaneous Misc Hormonic Hair Solution with minoxidil USP 7% and finasteride USP 0.05% - apply to affected areas daily every morning.   SUMAtriptan  100 MG tablet Commonly known as: IMITREX  Take 100 mg by mouth daily as needed.   torsemide  20 MG tablet Commonly known as: DEMADEX  Take 3 tablets (60 mg total) by mouth daily.   tretinoin  0.05 % cream Commonly known as: RETIN-A  Apply 1 Application topically at bedtime.   Tryngolza 80 MG/0.8ML Soaj Generic drug: [place] Sodium Inject 1 Units into the skin every 30 (thirty) days.        Follow-up Information     Emilio Joesph DEL, PA-C. Schedule an appointment as soon as possible for a visit in 2 week(s).   Specialty: Physician Assistant Why: for hospital follow-up Contact information: [address] [place] [address] [phone]                Discharge Exam: Jennifer [name]   03/31/24 0522 04/01/24 0535 04/01/24 1145  Weight: 69.8 kg 70.3 kg 70.8 kg   S: No acute complaints, Foley removed yesterday, voiding without any difficulty.  Feels back to baseline.  Cleared by cardiology to discharge home.  BP 137/62 (BP Location: Right Arm)   Pulse 82   Temp 98.5 F (36.9 C) (Oral)   Resp 17   Ht 4' 9 (1.448 m)   Wt 70.8 kg   SpO2 94%   BMI 33.78 kg/m   Physical Exam General: Alert and oriented x 3, NAD Cardiovascular: S1 S2 clear, RRR.  Respiratory: CTAB, no wheezing, Gastrointestinal: Soft, nontender, nondistended, NBS Ext: no pedal edema bilaterally Neuro: no new deficits Psych: Normal affect    Condition at discharge: fair  The results of significant diagnostics from this  hospitalization (including imaging, microbiology, ancillary and laboratory) are listed below for reference.   Imaging Studies: DG Chest 2 View Result Date: 03/29/2024 CLINICAL DATA:  Shortness of breath EXAM: CHEST - 2 VIEW COMPARISON:  02/22/2024, 12/07/2023 FINDINGS: Mild cardiomegaly. Stable cardiac monitoring device. No acute airspace disease, pleural effusion or pneumothorax IMPRESSION: No active cardiopulmonary disease. Mild cardiomegaly. Electronically Signed   By: Luke Bun M.D.   On:  03/29/2024 18:54    Microbiology: Results for orders placed or performed during the hospital encounter of 03/29/24  MRSA Next Gen by PCR, Nasal     Status: Abnormal   Collection Time: 03/30/24  4:39 PM   Specimen: Nasal Mucosa; Nasal Swab  Result Value Ref Range Status   MRSA by PCR Next Gen DETECTED (A) NOT DETECTED Final    Comment: (NOTE) The GeneXpert MRSA Assay (FDA approved for NASAL specimens only), is one component of a comprehensive MRSA colonization surveillance program. It is not intended to diagnose MRSA infection nor to guide or monitor treatment for MRSA infections. Test performance is not FDA approved in patients less than 12 years old. Performed at [facility], [address], [place], [place] [address]     Labs: CBC: Recent Labs  Lab 03/29/24 1728 03/30/24 0720 03/31/24 0122  WBC 7.9 7.4 8.0  NEUTROABS  --  4.4 4.9  HGB 9.6* 9.1* 10.2*  HCT 28.9* 27.4* 30.9*  MCV 92.3 92.6 92.2  PLT 111* 104* 130*   Basic Metabolic Panel: Recent Labs  Lab 03/29/24 1728 03/30/24 0720 03/31/24 0122 04/01/24 0127  NA 139 136 137 134*  K 3.9 4.6 4.3 4.2  CL 98 96* 96* 94*  CO2 22 24 24 24   GLUCOSE 185* 350* 253* 259*  BUN 76* 76* 77* 82*  CREATININE 2.76* 2.99* 2.58* 2.78*  CALCIUM  9.4 9.4 9.0 9.0  MG  --  1.9  --   --    Liver Function Tests: Recent Labs  Lab 03/30/24 0720 03/31/24 0122  AST 101* 101*  ALT 252* 219*  ALKPHOS 262* 256*  BILITOT 0.6 0.5  PROT  7.9 7.8  ALBUMIN  4.5 4.5   CBG: Recent Labs  Lab 03/31/24 1212 03/31/24 1620 03/31/24 2146 04/01/24 0637 04/01/24 1220  GLUCAP 307* 218* 144* 229* 199*    Discharge time spent: greater than 30 minutes.  Signed: Nydia Distance, MD [facility] 04/01/2024 "

## 2024-04-01 NOTE — Progress Notes (Signed)
 Pt stable for D/C and vitals w/in orders. Pt en route to discharge lounge for ride home.

## 2024-04-01 NOTE — Progress Notes (Signed)
 Heart Failure Navigator Progress Note  Assessed for Heart & Vascular TOC clinic readiness.  Patient does not meet criteria due to she is an Advanced Heart Failure Team patient of Dr. [name]. .   Navigator will sign off at this time.   Stephane Haddock, BSN, Scientist, Clinical (histocompatibility And Immunogenetics) Only

## 2024-04-01 NOTE — TOC Transition Note (Signed)
 Transition of Care [facility]) - Discharge Note   Patient Details  Name: Jennifer [name] MRN: [id number] Date of Birth: 19-Nov-1990  [facility] CM/SW Contact:  Roxie KANDICE Stain, RN Phone Number: 04/01/2024, 1:52 PM   Clinical Narrative:    [name] is stable to discharge home. Follow up apt on AVS. No ICM (Inpatient Care Management) needs at this time.    Final next level of care: Home/Self Care Barriers to Discharge: Barriers Resolved   Patient Goals and CMS Choice Patient states their goals for this hospitalization and ongoing recovery are:: return home          Discharge Placement  Home                     Discharge Plan and Services Additional resources added to the After Visit Summary for                                       Social Drivers of Health (SDOH) Interventions SDOH Screenings   Food Insecurity: No Food Insecurity (03/30/2024)  Recent Concern: Food Insecurity - Food Insecurity Present (01/22/2024)   Received from [facility]  Housing: Low Risk (03/30/2024)  Transportation Needs: No Transportation Needs (03/30/2024)  Recent Concern: Transportation Needs - Unmet Transportation Needs (01/22/2024)   Received from [facility]  Utilities: Not At Risk (03/30/2024)  Alcohol  Screen: Low Risk (12/10/2021)  Depression (PHQ2-9): Low Risk (12/10/2021)  Financial Resource Strain: Medium Risk (01/22/2024)   Received from [facility]  Physical Activity: Inactive (01/22/2024)   Received from [facility]  Social Connections: Socially Integrated (01/22/2024)   Received from [facility]  Stress: No Stress Concern Present (01/22/2024)   Received from [facility]  Tobacco Use: Low Risk (03/29/2024)     Readmission Risk Interventions    12/11/2023    3:37 PM 08/21/2023    2:55 PM 05/08/2023    4:21 PM  Readmission Risk Prevention Plan  Transportation Screening Complete Complete Complete  Medication Review Special Educational Needs Teacher) Complete Complete Referral to Pharmacy  PCP or Specialist appointment within 3-5 days of discharge  Complete Complete  HRI or Home Care Consult Complete Complete Complete  SW Recovery Care/Counseling Consult  Complete Complete  Palliative Care Screening Not Applicable Not Applicable Not Applicable  Skilled Nursing Facility Not Applicable Complete Not Applicable

## 2024-04-01 NOTE — Progress Notes (Signed)
"  °  Progress Note  Patient Name: Jennifer [name] Date of Encounter: 04/01/2024 [facility] Cardiologist: [name], MD   Interval Summary   Alert and orientated on room air.  Denies any chest pain or shortness of breath.  Denies any lower extremity edema.  Stated that she does feel some abdominal distention and might be slightly volume up.  Vital Signs Vitals:   04/01/24 0535 04/01/24 0905 04/01/24 0915 04/01/24 1145  BP: (!) 144/83 133/72 133/72   Pulse: 94 98    Resp: 20 18    Temp: 97.9 F (36.6 C) 98.3 F (36.8 C)    TempSrc: Oral Oral    SpO2: 97% 94%    Weight: 70.3 kg   70.8 kg  Height:        Intake/Output Summary (Last 24 hours) at 04/01/2024 1206 Last data filed at 03/31/2024 1622 Gross per 24 hour  Intake --  Output 1050 ml  Net -1050 ml      04/01/2024   11:45 AM 04/01/2024    5:35 AM 03/31/2024    5:22 AM  Last 3 Weights  Weight (lbs) 156 lb 1.6 oz 154 lb 15.7 oz 153 lb 14.4 oz  Weight (kg) 70.806 kg 70.3 kg 69.809 kg      Telemetry/ECG  Normal sinus rhythm with heart rates in the 90s to 110s- Personally Reviewed  Physical Exam  GEN: No acute distress.  Alert and orientated on room air. Neck: No JVD Cardiac: RRR, no murmurs, rubs, or gallops.  Respiratory: Clear to auscultation bilaterally. GI: Soft, nontender, and slightly distended. MS: No edema  Assessment & Plan   Acute on chronic heart failure with improved LVEF. Hypertension Was admitted volume overloaded.  Was started on IV diuresis.  And became euvolemic and asymptomatic.  Patient has a history of noncompliance.  On 03/31/2024 the patient had a weight of 153.9 pounds.  Current weight is 156.1 pounds.  Suspect that patient is stable for discharge today. Continue torsemide  60 mg daily. (Patient was on this prior to admission.) Contacted heart failure team to reschedule patient's outpatient follow-up to be in 2-3 weeks. Discussed with patient and she is comfortable going home  today. GDMT Continue Imdur  30 mg daily Continue hydralazine  100 mg every 8 hours Continue ivabradine  7.5 mg twice daily   Coronary artery disease Hyperlipidemia Denies any chest pain. Continue aspirin  81 mg daily Continue atorvastatin  80 mg daily   Otherwise management per primary   Suspect [facility] will sign off.   Follow up as an outpatient:  in 2-3 weeks.  For questions or updates, please contact  [facility] Please consult [email or web address] for contact info under        Signed, Morse Clause, PA-C   "

## 2024-04-02 ENCOUNTER — Ambulatory Visit (HOSPITAL_COMMUNITY)

## 2024-04-03 ENCOUNTER — Other Ambulatory Visit (HOSPITAL_COMMUNITY): Payer: Self-pay

## 2024-04-03 NOTE — Progress Notes (Signed)
 Paramedicine Encounter    Patient ID: Jennifer [name], female    DOB: 01-03-1991, 34 y.o.   MRN: [id number]   Complaints-none   Edema-no  Compliance with meds-yes  Pill box filled-Jennifer [name] filled it and I checked it-corrected errors  If so, by whom-Jennifer [name] did   Refills needed-vit d and levocetrizine was not delivered -asked fr those again     Pt reports that Jennifer [name] is doing better. Jennifer [name] was recently d/c from hosp from volume overload.  No meds were changed.  Jennifer [name] filled up pill box and I checked it.  I did fill up one day Jennifer [name] had taken one day so filled that back up.  There were a couple of errors-had double amitriptyline  in a couple days and left out allopurinol  in a couple days.  That was corrected.  Jennifer [name] is missing the vit d and levocet- I asked pharm to deliver those.   Jennifer [name] reports Jennifer [name] feels better, her breathing is better. Jennifer [name] denies c/p or dizziness.   Her clinic appoint got cancelled yesterday after they told her in the hosp to go and they confirmed appoint on Monday.  Jennifer [name] wants to be seen sooner, Jennifer [name] is not happy about her appoint getting cancelled yesterday as Jennifer [name] wants to see provider about managing volume and her b/p better.  Sent message ref that.  Will f/u next week.    BP (!) 164/90   Pulse 90   Resp 16   Wt 152 lb (68.9 kg)   SpO2 98%   BMI 32.89 kg/m  Weight yesterday-151 Last visit weight-153  Patient Care Team: [name] as PCP - General (Physician Assistant) Tommas Pears, MD as PCP - Endocrinology (Endocrinology) Ladona Heinz, MD as PCP - Cardiology (Cardiology)  Patient Active Problem List   Diagnosis Date Noted   Drug-seeking behavior 03/30/2024   Pure hypercholesterolemia 02/24/2024   CKD (chronic kidney disease) stage 4, GFR 15-29 ml/min (HCC) - baseline scr 2.8-3.0 02/23/2024   Hyperglycemia due to diabetes mellitus (HCC) 02/23/2024   NICM (nonischemic cardiomyopathy) (HCC) 02/23/2024   Skin infection 01/02/2024   Controlled type 2  diabetes mellitus with hyperglycemia (HCC) 08/31/2023   Sleep apnea 08/30/2023   Chronic pancreatitis (HCC) 08/15/2023   Peripheral neuropathy 04/10/2023   Coronary artery disease due to lipid rich plaque 01/18/2023   Elevated liver enzymes 01/02/2023   Bladder wall thickening 01/02/2023   Hyperglycemia in setting of T2DM 09/19/2022   ASCUS of cervix with negative high risk HPV 05/31/2022   Pulmonary hypertension (HCC) 02/06/2022   NASH (nonalcoholic steatohepatitis) 02/06/2022   Obesity, Class I, BMI 30-34.9 02/06/2022   Acute on chronic combined systolic and diastolic CHF (congestive heart failure) (HCC) 12/02/2021   Pancytopenia (HCC) 12/02/2021   Amenorrhea 12/02/2021   Hepatic steatosis 09/27/2021   Hx of insulin  dependent diabetes mellitus 08/14/2021   Gastroparesis due to DM (HCC) 05/12/2021   History of astrocytoma of brain 02/09/2021   Type 2 diabetes mellitus with hyperglycemia, with long-term current use of insulin  (HCC) 01/25/2021   Elevated transaminase level    Orthostatic hypotension 11/28/2020   Acute combined systolic and diastolic heart failure (HCC)    Nonischemic cardiomyopathy (HCC)    Chronic systolic congestive heart failure (HCC) 11/23/2020   Primary hypertension 11/23/2020   Familial hypertriglyceridemia 11/23/2020   Prolonged QT interval 11/23/2020   Polycystic ovary syndrome 01/30/2020   Moderate aortic insufficiency 08/16/2019   Moderate mitral regurgitation 08/16/2019   Lipoprotein lipase deficiency, familial 06/19/2019   Microalbuminuria due  to type 1 diabetes mellitus (HCC) 06/19/2019   Type 2 diabetes mellitus with hyperlipidemia (HCC) 10/10/2018   Hypothyroidism 10/10/2018   Current Medications[1] Allergies[2]    Social History   Socioeconomic History   Marital status: Divorced    Spouse name: Not on file   Number of children: 0   Years of education: Not on file   Highest education level: Not on file  Occupational History    Occupation: Dance Movement Psychotherapist   Occupation: N/A  Tobacco Use   Smoking status: Never   Smokeless tobacco: Never  Vaping Use   Vaping status: Never Used  Substance and Sexual Activity   Alcohol  use: Never   Drug use: Never   Sexual activity: Yes  Other Topics Concern   Not on file  Social History Narrative   Not on file   Social Drivers of Health   Tobacco Use: Low Risk (03/29/2024)   Patient History    Smoking Tobacco Use: Never    Smokeless Tobacco Use: Never    Passive Exposure: Not on file  Financial Resource Strain: Medium Risk (01/22/2024)   Received from [facility]   Overall Financial Resource Strain (CARDIA)    How hard is it for you to pay for the very basics like food, housing, medical care, and heating?: Somewhat hard  Food Insecurity: No Food Insecurity (03/30/2024)   Epic    Worried About Programme Researcher, Broadcasting/film/video in the Last Year: Never true    Ran Out of Food in the Last Year: Never true  Recent Concern: Food Insecurity - Food Insecurity Present (01/22/2024)   Received from [facility]   Epic    Within the past 12 months, you worried that your food would run out before you got the money to buy more.: Sometimes true    Within the past 12 months, the food you bought just didn't last and you didn't have money to get more.: Sometimes true  Transportation Needs: No Transportation Needs (03/30/2024)   Epic    Lack of Transportation (Medical): No    Lack of Transportation (Non-Medical): No  Recent Concern: Transportation Needs - Unmet Transportation Needs (01/22/2024)   Received from [facility]    Lack of Transportation (Medical): Patient declined    In the past 12 months, has lack of transportation kept you from meetings, work, or from getting things needed for daily living?: Yes  Physical Activity: Inactive (01/22/2024)   Received from [facility]   Exercise Vital Sign    On average, how many days per week do you engage in moderate to strenuous exercise (like  a brisk walk)?: 0 days    Minutes of Exercise per Session: Not on file  Stress: No Stress Concern Present (01/22/2024)   Received from [facility] - Occupational Stress Questionnaire    Do you feel stress - tense, restless, nervous, or anxious, or unable to sleep at night because your mind is troubled all the time - these days?: Not at all  Social Connections: Socially Integrated (01/22/2024)   Received from [facility]   Social Network    How would you rate your social network (family, work, friends)?: Good participation with social networks  Intimate Partner Violence: Not At Risk (03/30/2024)   Epic    Fear of Current or Ex-Partner: No    Emotionally Abused: No    Physically Abused: No    Sexually Abused: No  Depression (PHQ2-9): Low Risk (  12/10/2021)   Depression (PHQ2-9)    PHQ-2 Score: 0  Alcohol  Screen: Low Risk (12/10/2021)   Alcohol  Screen    Last Alcohol  Screening Score (AUDIT): 0  Housing: Low Risk (03/30/2024)   Epic    Unable to Pay for Housing in the Last Year: No    Number of Times Moved in the Last Year: 0    Homeless in the Last Year: No  Utilities: Not At Risk (03/30/2024)   Epic    Threatened with loss of utilities: No  Health Literacy: Not on file    Physical Exam      Future Appointments  Date Time Provider Department Center  04/22/2024 10:30 AM [facility] PA/NP [facility] None  04/26/2024 11:00 AM Hope Almarie ORN, NP [facility] [address]  05/07/2024  3:15 PM Alm Jennifer SAILOR, DO [facility] None       Izetta Quivers, Paramedic [phone] [facility] Paramedic  04/03/2024     [1]  Current Outpatient Medications:    albuterol  (VENTOLIN  HFA) 108 (90 Base) MCG/ACT inhaler, Inhale 2 puffs into the lungs daily as needed for wheezing or shortness of breath., Disp: , Rfl:    allopurinol  (ZYLOPRIM ) 100 MG tablet, Take 1 tablet (100 mg total) by mouth daily., Disp: 90 tablet, Rfl: 3   aspirin  EC 81 MG tablet,  Take 1 tablet (81 mg total) by mouth daily. Swallow whole., Disp: 30 tablet, Rfl: 1   atorvastatin  (LIPITOR ) 80 MG tablet, Take 1 tablet (80 mg total) by mouth every evening., Disp: 30 tablet, Rfl: 0   Biotin 10000 MCG TABS, Take 1 tablet by mouth daily., Disp: , Rfl:    calcitRIOL  (ROCALTROL ) 0.25 MCG capsule, Take 0.25 mcg by mouth daily., Disp: , Rfl:    cephALEXin  (KEFLEX ) 500 MG capsule, Take 1 capsule (500 mg total) by mouth 2 (two) times daily for 5 days., Disp: 10 capsule, Rfl: 0   doxazosin  (CARDURA ) 4 MG tablet, Take 1 tablet (4 mg total) by mouth daily., Disp: 30 tablet, Rfl: 5   DULoxetine  (CYMBALTA ) 60 MG capsule, Take 60 mg by mouth at bedtime., Disp: , Rfl:    ergocalciferol  (VITAMIN D2) 1.25 MG (50000 UT) capsule, Take 1 capsule (50,000 Units total) by mouth every Sunday., Disp: 4 capsule, Rfl: 0   hydrALAZINE  (APRESOLINE ) 100 MG tablet, Take 1 tablet (100 mg total) by mouth every 8 (eight) hours., Disp: 90 tablet, Rfl: 3   isosorbide  mononitrate (IMDUR ) 60 MG 24 hr tablet, Take 1 tablet (60 mg total) by mouth daily., Disp: 30 tablet, Rfl: 3   ivabradine  (CORLANOR ) 7.5 MG TABS tablet, Take 1 tablet (7.5 mg total) by mouth 2 (two) times daily with a meal., Disp: 60 tablet, Rfl: 11   lansoprazole  (PREVACID ) 30 MG capsule, TAKE 1 CAPSULE (30 MG TOTAL) BY MOUTH 2 (TWO) TIMES DAILY BEFORE A MEAL., Disp: 60 capsule, Rfl: 2   levocetirizine (XYZAL ) 5 MG tablet, Take 5 mg by mouth at bedtime., Disp: , Rfl:    levothyroxine  (SYNTHROID ) 150 MCG tablet, Take 150 mcg by mouth daily before breakfast., Disp: , Rfl:    pregabalin  (LYRICA ) 300 MG capsule, Take 300 mg by mouth at bedtime., Disp: , Rfl:    promethazine  (PHENERGAN ) 25 MG tablet, Take 25 mg by mouth at bedtime., Disp: , Rfl:    torsemide  (DEMADEX ) 20 MG tablet, Take 3 tablets (60 mg total) by mouth daily., Disp: 90 tablet, Rfl: 2   amitriptyline  (ELAVIL ) 10 MG tablet, Take 10 mg by mouth. (Patient  taking differently: Take 10 mg by mouth  at bedtime.), Disp: , Rfl:    azelastine  (ASTELIN ) 0.1 % nasal spray, Place 2 sprays into both nostrils daily as needed for rhinitis or allergies., Disp: , Rfl:    BAQSIMI ONE PACK 3 MG/DOSE POWD, Place 1 Dose into the nose once as needed (hypoglycemia). (Patient not taking: Reported on 04/01/2024), Disp: , Rfl:    dicyclomine  (BENTYL ) 20 MG tablet, Take 1 tablet (20 mg total) by mouth 2 (two) times daily., Disp: 20 tablet, Rfl: 0   Glucose Blood (BLOOD GLUCOSE TEST STRIPS) STRP, 1 each by Does not apply route 3 (three) times daily. Use as directed to check blood sugar. May dispense any manufacturer covered by patient's insurance and fits patient's device., Disp: 100 strip, Rfl: 0   HUMALOG  KWIKPEN 100 UNIT/ML KwikPen, Inject 60 Units into the skin in the morning, at noon, and at bedtime. (Patient taking differently: Inject 60 Units into the skin in the morning, at noon, and at bedtime.), Disp: , Rfl:    HUMULIN  R U-500 KWIKPEN 500 UNIT/ML KwikPen, Inject 300 Units into the skin in the morning and at bedtime. (Patient taking differently: Inject 150 Units into the skin in the morning and at bedtime.), Disp: , Rfl:    Insulin  Pen Needle (PEN NEEDLES) 31G X 5 MM MISC, Use as directed 3 (three) times daily., Disp: 100 each, Rfl: 0   Lancet Device MISC, 1 each by Does not apply route 3 (three) times daily. May dispense any manufacturer covered by patient's insurance., Disp: 1 each, Rfl: 0   Lancets MISC, 1 each by Does not apply route 3 (three) times daily. Use as directed to check blood sugar. May dispense any manufacturer covered by patient's insurance and fits patient's device., Disp: 100 each, Rfl: 0   Olezarsen Sodium (TRYNGOLZA) 80 MG/0.8ML SOAJ, Inject 1 Units into the skin every 30 (thirty) days., Disp: , Rfl:    Safety Seal Miscellaneous MISC, Hormonic Hair Solution with minoxidil USP 7% and finasteride USP 0.05% - apply to affected areas daily every morning., Disp: 30 mL, Rfl: 2   SUMAtriptan   (IMITREX ) 100 MG tablet, Take 100 mg by mouth daily as needed. (Patient not taking: Reported on 03/25/2024), Disp: , Rfl:    tretinoin  (RETIN-A ) 0.05 % cream, Apply 1 Application topically at bedtime., Disp: , Rfl:  [2]  Allergies Allergen Reactions   Icosapent  Ethyl (Epa Ethyl Ester) (Fish) Hives   Ketamine Other (See Comments)    In a vegetative state for 15 minutes, per pt   Maitake Itching and Other (See Comments)    Itchy throat   Morphine  Hives, Itching, Rash and Other (See Comments)    Sore throat, too   Mushroom Other (See Comments)    Pt has never had mushrooms but tested positive on allergy test   Penicillins Hives, Itching and Rash    Tolerates cephalosporins   Shellfish Allergy Other (See Comments)    Pt has never had shellfish, but tested positive on allergy test. Pt states contrast in CT is okay.   Fentanyl  Nausea And Vomiting   Prednisone Rash

## 2024-04-04 ENCOUNTER — Ambulatory Visit (HOSPITAL_COMMUNITY)

## 2024-04-04 ENCOUNTER — Telehealth (HOSPITAL_COMMUNITY): Payer: Self-pay | Admitting: *Deleted

## 2024-04-04 NOTE — Telephone Encounter (Signed)
 Called to confirm/remind patient of their appointment at [facility] on 04/06/23.       Appointment:              [x] Confirmed             [] Left mess              [] No answer/No voice mail             [] Phone not in service   Patient reminded to bring all medications and/or complete list.   Confirmed patient has transportation. Gave directions, instructed to utilize valet parking.

## 2024-04-05 ENCOUNTER — Telehealth (HOSPITAL_COMMUNITY): Payer: Self-pay

## 2024-04-05 ENCOUNTER — Ambulatory Visit (HOSPITAL_COMMUNITY)
Admission: RE | Admit: 2024-04-05 | Discharge: 2024-04-05 | Disposition: A | Source: Ambulatory Visit | Attending: Adult Health | Admitting: Adult Health

## 2024-04-05 ENCOUNTER — Ambulatory Visit: Admitting: Primary Care

## 2024-04-05 VITALS — BP 149/73 | HR 96

## 2024-04-05 DIAGNOSIS — I2510 Atherosclerotic heart disease of native coronary artery without angina pectoris: Secondary | ICD-10-CM | POA: Diagnosis not present

## 2024-04-05 DIAGNOSIS — G4733 Obstructive sleep apnea (adult) (pediatric): Secondary | ICD-10-CM | POA: Diagnosis not present

## 2024-04-05 DIAGNOSIS — I272 Pulmonary hypertension, unspecified: Secondary | ICD-10-CM | POA: Insufficient documentation

## 2024-04-05 DIAGNOSIS — Z91148 Patient's other noncompliance with medication regimen for other reason: Secondary | ICD-10-CM | POA: Diagnosis not present

## 2024-04-05 DIAGNOSIS — E1122 Type 2 diabetes mellitus with diabetic chronic kidney disease: Secondary | ICD-10-CM | POA: Insufficient documentation

## 2024-04-05 DIAGNOSIS — Z8719 Personal history of other diseases of the digestive system: Secondary | ICD-10-CM | POA: Insufficient documentation

## 2024-04-05 DIAGNOSIS — E781 Pure hyperglyceridemia: Secondary | ICD-10-CM | POA: Insufficient documentation

## 2024-04-05 DIAGNOSIS — R1900 Intra-abdominal and pelvic swelling, mass and lump, unspecified site: Secondary | ICD-10-CM | POA: Diagnosis not present

## 2024-04-05 DIAGNOSIS — N184 Chronic kidney disease, stage 4 (severe): Secondary | ICD-10-CM | POA: Insufficient documentation

## 2024-04-05 DIAGNOSIS — I5022 Chronic systolic (congestive) heart failure: Secondary | ICD-10-CM | POA: Insufficient documentation

## 2024-04-05 DIAGNOSIS — I130 Hypertensive heart and chronic kidney disease with heart failure and stage 1 through stage 4 chronic kidney disease, or unspecified chronic kidney disease: Secondary | ICD-10-CM | POA: Insufficient documentation

## 2024-04-05 DIAGNOSIS — Z794 Long term (current) use of insulin: Secondary | ICD-10-CM | POA: Insufficient documentation

## 2024-04-05 DIAGNOSIS — Z79899 Other long term (current) drug therapy: Secondary | ICD-10-CM | POA: Diagnosis not present

## 2024-04-05 DIAGNOSIS — K589 Irritable bowel syndrome without diarrhea: Secondary | ICD-10-CM | POA: Insufficient documentation

## 2024-04-05 DIAGNOSIS — K861 Other chronic pancreatitis: Secondary | ICD-10-CM | POA: Diagnosis not present

## 2024-04-05 DIAGNOSIS — I5032 Chronic diastolic (congestive) heart failure: Secondary | ICD-10-CM

## 2024-04-05 DIAGNOSIS — I428 Other cardiomyopathies: Secondary | ICD-10-CM | POA: Insufficient documentation

## 2024-04-05 MED ORDER — METOLAZONE 2.5 MG PO TABS: ORAL_TABLET | ORAL | 0 refills | Status: AC

## 2024-04-05 NOTE — Telephone Encounter (Signed)
 Called to confirm/remind patient of their appointment at the [facility] on 04/05/24.   Appointment:   [] Confirmed  [x] Left mess   [] No answer/No voice mail  [] VM Full/unable to leave message  [] Phone not in service  Patient reminded to bring all medications and/or complete list.  Confirmed patient has transportation. Gave directions, instructed to utilize valet parking.

## 2024-04-05 NOTE — Telephone Encounter (Signed)
 OH I KNOW IT TAKES A MIN WHEN ITS HIM

## 2024-04-05 NOTE — Progress Notes (Signed)
 "   Advanced Heart Failure Clinic Note   PCP: Emilio Joesph DEL, PA-C Primary Cardiologist: [name], MD HF Cardiologist: Dr. [name]    HPI: Jennifer [name] is a 34 y.o. 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   Has been followed by Dr. Bergamo for her HF.    Admitted 8/22 with vasculitis (no pancreatitis). Her TG were found to be 4030. Echo 11/28/20 EF 20-25% with moderate RV dysfunction and mild septal flattening. Started on DBA 2.5. cMRI c/w prior myocarditis vs sarcoid (see below). Mod pericardial effusion also noted. LVEF 29%. RV normal. She underwent R/LHC showing single vessel RCA occlusion, RA 1, LVEDP 17, Fick CO 5.6/CI 3.88. She was able to be weaned off inotropes and GDMT titrated.     Post hospital follow up she was hypervolemic, ReDs 43%. She followed up with Dr. Fredrica with [facility] 9/22 for a 2nd opinion. He placed LifeVest and switched beta blocker to Toprol  XL.   Echo 10/22: EF up to 45-50%, grade II DD, RV ok. LifeVest off.   Treated in ED 10/22 for pancreatitis, then admitted 11/22 for hyperglycemia.   Echo 12/22 EF 45%, RV normal.    Follow up 5/23, weight up 16 lbs, ReDs 27%. Arranged for RHC to better assess hemodynamics and see if abdominal symptoms related to right-sided HF due to high-output state or non-cardiac. This showed elevated filling pressures and normal cardiac ouput. Mild to moderate mixed PH with prominent v-waves in RA, suggestive of significant TR. Given IV lasix  and metolazone  2.5 added weekly to diuretic regimen.    Admitted 5/23 with AKI and hyperkalemia. Given IVF, insulin , calcium  gluconate and Lokelma . Echo showed EF 45-50%, RV normal, mild-mod MR, RVSP 30. Only mild TR. Restarted on torsemide  40 bid (lower dose) and Entresto  49/51 bid. Arranged paramedicine,  discharged home 143 lbs.   Seen 7/23 at [facility] for 2nd opinion, felt to be congested and recommended increasing torsemide  to 60 mg bid and instructed f/u in 3 months.   Received Furoscix  8/4, 8/5, and 10/31/21.  Admitted 9/23 for volume overload in setting of RV failure and AKI. Required milrinone . Underwent Cardiomems implant. RHC showed RA 15, PA 37/23 (28), PCWP 16, Fick CO/CI 4.2/2.6, Thermo CO/CI 3.7/2.3, PVR 4.3, PA sat 51%, 54%, PAPi 0.93  Previously being followed by Dr. Kimble at [facility]. Had renal biopsy 10/23 which showed DM and HTN nephropathy. They discussed starting HD soon. Now being followed by [facility].   Echo 10/04/22 EF 55% RV ok. Mild AI   Continued to have multiple hospitalizations and ED visits for hyperglycemia, hyperTG (>5,000), uncontrolled HTN, and abdominal pain.   Admitted 9/25 for a/c CHF in setting of poor med compliance. Echo EF 45-50%, RV mildly reduced. She was diuresed w/ IV Lasix  gtt and metolazone , sCr bumped to >3.0. Underwent RHC which showed RA 6, PA 46/23 (mean 31), PCWP 13, FICK CI 1.8, TPG 18, PVR 6.4 WU, PAPi 3.8, severely elevated SVR 2857. Significant respiratory variation, but cardiomems correlates well with RHC numbers. Of note it was recommended, when evaluating cardiomems, would look at the PA tracings and use the end expiratory values when looking at [facility]. GDMT titrated and she was re-enrolled in paramedicine. Discharged home, weight 139 lbs.  Admitted 12/28/23, sent from AHF clinic, for a/c dHF. She was diuresed with IV lasix  and discharged home, weight 145  lbs.  Readmitted 03/29/24 with A/C HFrEF. Diuresed with IV lasix  and transitioned to torsemide  60 mg daily.   Today she returns for HF follow up. Gets SOB with exertion. Denies PND/Orthopnea. Having ongoing abdominal bloating. Appetite ok. No fever or chills. Weight at home 151-153  pounds. Taking all medications. Pills are placed in a pill box by Paramedicine.   Followed by HF Paramedicine.   ROS:  All systems reviewed and negative except as per HPI.   Past Medical History:  Diagnosis Date   Afib (HCC) 05/12/2021   Brain tumor (HCC) 03/29/1995   astrocytoma   CHF (congestive heart failure) (HCC)    Cholesterosis    CKD (chronic kidney disease) stage 4, GFR 15-29 ml/min (HCC) 05/13/2021   DM (diabetes mellitus) (HCC) 10/10/2018   Fatty liver    Gastroparesis due to DM (HCC) 05/12/2021   HTN (hypertension) 10/10/2018   Hypothyroidism 10/10/2018   Lipoprotein deficiency    Lung disease    longevity long term   Pancreatitis    Polycystic ovary syndrome    Small intestinal bacterial overgrowth (SIBO) 08/26/2021   Type 2 MI (myocardial infarction) (HCC) 01/19/2023   Current Outpatient Medications  Medication Sig Dispense Refill   albuterol  (VENTOLIN  HFA) 108 (90 Base) MCG/ACT inhaler Inhale 2 puffs into the lungs daily as needed for wheezing or shortness of breath.     allopurinol  (ZYLOPRIM ) 100 MG tablet Take 1 tablet (100 mg total) by mouth daily. 90 tablet 3   amitriptyline  (ELAVIL ) 10 MG tablet Take 10 mg by mouth. (Patient taking differently: Take 10 mg by mouth at bedtime.)     aspirin  EC 81 MG tablet Take 1 tablet (81 mg total) by mouth daily. Swallow whole. 30 tablet 1   atorvastatin  (LIPITOR ) 80 MG tablet Take 1 tablet (80 mg total) by mouth every evening. 30 tablet 0   azelastine  (ASTELIN ) 0.1 % nasal spray Place 2 sprays into both nostrils daily as needed for rhinitis or allergies.     BAQSIMI ONE PACK 3 MG/DOSE POWD Place 1 Dose into the nose once as needed (hypoglycemia).     Biotin 89999 MCG TABS Take 1 tablet by mouth daily.     calcitRIOL  (ROCALTROL ) 0.25 MCG capsule Take 0.25 mcg by mouth daily.     cephALEXin  (KEFLEX ) 500 MG capsule Take 1 capsule (500 mg total) by mouth 2 (two) times daily for 5 days. 10 capsule 0   dicyclomine  (BENTYL ) 20 MG tablet Take 1 tablet (20 mg total) by mouth 2 (two) times daily. 20 tablet 0   doxazosin  (CARDURA ) 4 MG tablet Take 1  tablet (4 mg total) by mouth daily. 30 tablet 5   DULoxetine  (CYMBALTA ) 60 MG capsule Take 60 mg by mouth at bedtime.     ergocalciferol  (VITAMIN D2) 1.25 MG (50000 UT) capsule Take 1 capsule (50,000 Units total) by mouth every Sunday. 4 capsule 0   Glucose Blood (BLOOD GLUCOSE TEST STRIPS) STRP 1 each by Does not apply route 3 (three) times daily. Use as directed to check blood sugar. May dispense any manufacturer covered by patient's insurance and fits patient's device. 100 strip 0   HUMALOG  KWIKPEN 100 UNIT/ML KwikPen Inject 60 Units into the skin in the morning, at noon, and at bedtime. (Patient taking differently: Inject 60 Units into the skin in the morning, at noon, and at bedtime.)     HUMULIN  R U-500 KWIKPEN 500 UNIT/ML KwikPen Inject 300 Units into the skin in the morning and at bedtime. (  Patient taking differently: Inject 150 Units into the skin in the morning and at bedtime.)     hydrALAZINE  (APRESOLINE ) 100 MG tablet Take 1 tablet (100 mg total) by mouth every 8 (eight) hours. 90 tablet 3   Insulin  Pen Needle (PEN NEEDLES) 31G X 5 MM MISC Use as directed 3 (three) times daily. 100 each 0   isosorbide  mononitrate (IMDUR ) 60 MG 24 hr tablet Take 1 tablet (60 mg total) by mouth daily. 30 tablet 3   ivabradine  (CORLANOR ) 7.5 MG TABS tablet Take 1 tablet (7.5 mg total) by mouth 2 (two) times daily with a meal. 60 tablet 11   Lancet Device MISC 1 each by Does not apply route 3 (three) times daily. May dispense any manufacturer covered by patient's insurance. 1 each 0   Lancets MISC 1 each by Does not apply route 3 (three) times daily. Use as directed to check blood sugar. May dispense any manufacturer covered by patient's insurance and fits patient's device. 100 each 0   lansoprazole  (PREVACID ) 30 MG capsule TAKE 1 CAPSULE (30 MG TOTAL) BY MOUTH 2 (TWO) TIMES DAILY BEFORE A MEAL. 60 capsule 2   levocetirizine (XYZAL ) 5 MG tablet Take 5 mg by mouth at bedtime.     levothyroxine  (SYNTHROID ) 150  MCG tablet Take 150 mcg by mouth daily before breakfast.     Olezarsen Sodium (TRYNGOLZA) 80 MG/0.8ML SOAJ Inject 1 Units into the skin every 30 (thirty) days.     pregabalin  (LYRICA ) 300 MG capsule Take 300 mg by mouth at bedtime.     promethazine  (PHENERGAN ) 25 MG tablet Take 25 mg by mouth at bedtime.     Safety Seal Miscellaneous MISC Hormonic Hair Solution with minoxidil USP 7% and finasteride USP 0.05% - apply to affected areas daily every morning. 30 mL 2   torsemide  (DEMADEX ) 20 MG tablet Take 3 tablets (60 mg total) by mouth daily. 90 tablet 2   tretinoin  (RETIN-A ) 0.05 % cream Apply 1 Application topically at bedtime.     SUMAtriptan  (IMITREX ) 100 MG tablet Take 100 mg by mouth daily as needed. (Patient not taking: Reported on 04/05/2024)     No current facility-administered medications for this encounter.   Allergies  Allergen Reactions   Icosapent  Ethyl (Epa Ethyl Ester) (Fish) Hives   Ketamine Other (See Comments)    In a vegetative state for 15 minutes, per pt   Maitake Itching and Other (See Comments)    Itchy throat   Morphine  Hives, Itching, Rash and Other (See Comments)    Sore throat, too   Mushroom Other (See Comments)    Pt has never had mushrooms but tested positive on allergy test   Penicillins Hives, Itching and Rash    Tolerates cephalosporins   Shellfish Allergy Other (See Comments)    Pt has never had shellfish, but tested positive on allergy test. Pt states contrast in CT is okay.   Fentanyl  Nausea And Vomiting   Prednisone Rash   Social History   Socioeconomic History   Marital status: Divorced    Spouse name: Not on file   Number of children: 0   Years of education: Not on file   Highest education level: Not on file  Occupational History   Occupation: Dance Movement Psychotherapist   Occupation: N/A  Tobacco Use   Smoking status: Never   Smokeless tobacco: Never  Vaping Use   Vaping status: Never Used  Substance and Sexual Activity   Alcohol  use: Never  Drug use: Never   Sexual activity: Yes  Other Topics Concern   Not on file  Social History Narrative   Not on file   Social Drivers of Health   Tobacco Use: Low Risk (03/29/2024)   Patient History    Smoking Tobacco Use: Never    Smokeless Tobacco Use: Never    Passive Exposure: Not on file  Financial Resource Strain: Medium Risk (01/22/2024)   Received from [facility]   Overall Financial Resource Strain (CARDIA)    How hard is it for you to pay for the very basics like food, housing, medical care, and heating?: Somewhat hard  Food Insecurity: No Food Insecurity (03/30/2024)   Epic    Worried About Programme Researcher, Broadcasting/film/video in the Last Year: Never true    Ran Out of Food in the Last Year: Never true  Recent Concern: Food Insecurity - Food Insecurity Present (01/22/2024)   Received from [facility]   Epic    Within the past 12 months, you worried that your food would run out before you got the money to buy more.: Sometimes true    Within the past 12 months, the food you bought just didn't last and you didn't have money to get more.: Sometimes true  Transportation Needs: No Transportation Needs (03/30/2024)   Epic    Lack of Transportation (Medical): No    Lack of Transportation (Non-Medical): No  Recent Concern: Transportation Needs - Unmet Transportation Needs (01/22/2024)   Received from [facility]    Lack of Transportation (Medical): Patient declined    In the past 12 months, has lack of transportation kept you from meetings, work, or from getting things needed for daily living?: Yes  Physical Activity: Inactive (01/22/2024)   Received from [facility]   Exercise Vital Sign    On average, how many days per week do you engage in moderate to strenuous exercise (like a brisk walk)?: 0 days    Minutes of Exercise per Session: Not on file  Stress: No Stress Concern Present (01/22/2024)   Received from [facility] - Occupational  Stress Questionnaire    Do you feel stress - tense, restless, nervous, or anxious, or unable to sleep at night because your mind is troubled all the time - these days?: Not at all  Social Connections: Socially Integrated (01/22/2024)   Received from [facility]   Social Network    How would you rate your social network (family, work, friends)?: Good participation with social networks  Intimate Partner Violence: Not At Risk (03/30/2024)   Epic    Fear of Current or Ex-Partner: No    Emotionally Abused: No    Physically Abused: No    Sexually Abused: No  Depression (PHQ2-9): Low Risk (12/10/2021)   Depression (PHQ2-9)    PHQ-2 Score: 0  Alcohol  Screen: Low Risk (12/10/2021)   Alcohol  Screen    Last Alcohol  Screening Score (AUDIT): 0  Housing: Low Risk (03/30/2024)   Epic    Unable to Pay for Housing in the Last Year: No    Number of Times Moved in the Last Year: 0    Homeless in the Last Year: No  Utilities: Not At Risk (03/30/2024)   Epic    Threatened with loss of utilities: No  Health Literacy: Not on file   Family History  Problem Relation Age of Onset   Diabetes Mother    Hypertension Mother  Hyperlipidemia Mother    Thyroid  disease Mother    Hypertension Father    Diabetes Father    Pancreatic cancer Paternal Aunt    Pancreatic cancer Paternal Uncle    Colon cancer Neg Hx    Esophageal cancer Neg Hx    Stomach cancer Neg Hx    Wt Readings from Last 3 Encounters:  04/03/24 68.9 kg (152 lb)  04/01/24 70.8 kg (156 lb 1.6 oz)  03/25/24 69.4 kg (153 lb)   BP (!) 149/73 (BP Location: Right Arm, Patient Position: Sitting, Cuff Size: Normal)   Pulse 96   SpO2 100%   Physical Exam  .General:   No resp difficulty Neck: JVP difficult to assess  Cor: Regular rate & rhythm.  Lungs: clear Abdomen: soft, nontender, distended.  Extremities: no  edema Neuro: alert & oriented x3  Cardiomems PAD goal 24 todays reading is 29.   ReDs reading: n/a  ASSESSMENT &  PLAN: Nonischemic cardiomyopathy, recovered EF, previously with HFrEF (EF 29%) - prev reduced EF to 29%.  - Echo 9/25: EF 45-50%, RV mildly reduced - RHC 9/25: RA 6, PA 46/23 (mean 31), PCWP 13, FICK CI 1.8, TPG 18, PVR 6.4 WU, PAPi 3.8, severely elevated SVR 2857 - NYHA III. Cardiomems reading elevated. Goal 24, reading today 29. We discussed the reading. Needs to continue daily readings. -Needs to continue torsemide  60 mg daily and take 2.5 mg metolazone  today and tomorrow. Plan to reassess volume status next week.  - GDMT limited by CKD - Continue hydralazine  100 mg tid + Imdur  60 mg daily - Continue ivabradine  7.5 mg bid. - No beta blocker with low CI on RHC - Continue HF Paramedicine.   2. CAD - Single vessel RCA occlusion (small co-dominant vessel) on cardiac cath 09/08. - No s/s angina - Continue medical management. On aspirin  + statin  3.  CKD IV - Scr baseline variable with frequent AKIs  - Previously followed by Dr. Kimble (Nephrology at [facility]) - Had renal biopsy (10/23) and showed DM and HTN nephropathy.  - Now followed by [facility]. Reports she will be completing HD teaching soon. - Baseline SCr now ~ 3 Reviewed BMET from 04/01/24 Creatinine 2.8.   4. Pulmonary hypertension - Mild to moderate on RHC 5/23. PVR 3.7 WU. - RHC 9/23: RA 15, PA 37/23 (28), PCWP 16,  Thermo CO/CI 3.7/2.3, PVR 4.3, PA sat 51%, 54%, PAPi 0.93  - RHC 9/25 RA 6, PA 46/23 (mean 31), PCWP 13, FICK CI 1.8, TPG 18, PVR 6.4 WU, PAPi 3.8, severely elevated SVR 2857 - Echo 9/25, RV mildly reduced  - V/Q negative - Off sildenafil  d/t dizziness and low BP - No change   5. TR - Prominent v-waves in RA tracing on RHC 5/23. - prev stable mild/mod - Trivial on echo 9/23  - Mild on echo 9/25  6. HTN  Continue current regimen.    7. OSA - Unable to tolerate CPAP mask - Follows with [facility]  8. Severe hyperTG - Due to LPL deficiency w/ recent pancreatitis. - Follows in [facility] (Dr. Mona)  - Continue  atorvastatin , off fenofibrate  d/t GFR  9. Type 3c DM - 2/2 chronic pancreatitis  - Follows with [facility] - On insulin   10. Chronic abdominal swelling/bloating - Seen at [facility] (11/20) for IBS, per chart review. - Had penumatosis intestinalis. She was started on pancreatic enzyme replacement. - Has DM gastroparesis - Suspect primarily fatty liver - no change  11. Poor Med  Compliance/SDOH Continue HF Paramedicine.    Follow up next week to reassess volume status. Check BMET next week.   Greig Mosses, NP  12:12 PM  "

## 2024-04-05 NOTE — Patient Instructions (Signed)
 Medication Changes:  TAKE METOLAZONE  2.5MG  ONCE DAILY AND ONCE TOMORROW   Follow-Up in: NEXT WEEK AS SCHEDULED ON MONDAY AT 830AM   At the Advanced Heart Failure Clinic, you and your health needs are our priority. We have a designated team specialized in the treatment of Heart Failure. This Care Team includes your primary Heart Failure Specialized Cardiologist (physician), Advanced Practice Providers (APPs- Physician Assistants and Nurse Practitioners), and Pharmacist who all work together to provide you with the care you need, when you need it.   You may see any of the following providers on your designated Care Team at your next follow up:  Dr. Toribio Fuel Dr. Ezra Shuck Dr. Odis Brownie Greig Mosses, NP Caffie Shed, [place] [facility] [place], [place] Beckey Coe, NP Jordan Lee, NP Tinnie Redman, PharmD   Please be sure to bring in all your medications bottles to every appointment.   Need to Contact Us :  If you have any questions or concerns before your next appointment please send us  a message through [place] or call our office at [phone].    TO LEAVE A MESSAGE FOR THE NURSE SELECT OPTION 2, PLEASE LEAVE A MESSAGE INCLUDING: YOUR NAME DATE OF BIRTH CALL BACK NUMBER REASON FOR CALL**this is important as we prioritize the call backs  YOU WILL RECEIVE A CALL BACK THE SAME DAY AS LONG AS YOU CALL BEFORE 4:00 PM

## 2024-04-08 ENCOUNTER — Telehealth (HOSPITAL_COMMUNITY): Payer: Self-pay

## 2024-04-08 ENCOUNTER — Ambulatory Visit (HOSPITAL_COMMUNITY): Payer: Self-pay | Admitting: Physician Assistant

## 2024-04-08 ENCOUNTER — Other Ambulatory Visit (HOSPITAL_COMMUNITY): Payer: Self-pay

## 2024-04-08 ENCOUNTER — Ambulatory Visit (HOSPITAL_COMMUNITY)
Admission: RE | Admit: 2024-04-08 | Discharge: 2024-04-08 | Disposition: A | Source: Ambulatory Visit | Attending: Physician Assistant

## 2024-04-08 ENCOUNTER — Ambulatory Visit (HOSPITAL_COMMUNITY): Admitting: Cardiology

## 2024-04-08 ENCOUNTER — Ambulatory Visit (HOSPITAL_COMMUNITY): Admission: RE | Admit: 2024-04-08 | Discharge: 2024-04-08 | Attending: Physician Assistant

## 2024-04-08 ENCOUNTER — Encounter (HOSPITAL_COMMUNITY): Payer: Self-pay

## 2024-04-08 VITALS — BP 162/66 | HR 99 | Wt 158.0 lb

## 2024-04-08 DIAGNOSIS — K3184 Gastroparesis: Secondary | ICD-10-CM | POA: Insufficient documentation

## 2024-04-08 DIAGNOSIS — Z7982 Long term (current) use of aspirin: Secondary | ICD-10-CM | POA: Diagnosis not present

## 2024-04-08 DIAGNOSIS — E1143 Type 2 diabetes mellitus with diabetic autonomic (poly)neuropathy: Secondary | ICD-10-CM | POA: Insufficient documentation

## 2024-04-08 DIAGNOSIS — Z79899 Other long term (current) drug therapy: Secondary | ICD-10-CM | POA: Diagnosis not present

## 2024-04-08 DIAGNOSIS — K861 Other chronic pancreatitis: Secondary | ICD-10-CM | POA: Diagnosis not present

## 2024-04-08 DIAGNOSIS — I5032 Chronic diastolic (congestive) heart failure: Secondary | ICD-10-CM | POA: Diagnosis not present

## 2024-04-08 DIAGNOSIS — Z8719 Personal history of other diseases of the digestive system: Secondary | ICD-10-CM | POA: Diagnosis not present

## 2024-04-08 DIAGNOSIS — E1122 Type 2 diabetes mellitus with diabetic chronic kidney disease: Secondary | ICD-10-CM | POA: Diagnosis not present

## 2024-04-08 DIAGNOSIS — Z91148 Patient's other noncompliance with medication regimen for other reason: Secondary | ICD-10-CM | POA: Diagnosis not present

## 2024-04-08 DIAGNOSIS — I272 Pulmonary hypertension, unspecified: Secondary | ICD-10-CM | POA: Insufficient documentation

## 2024-04-08 DIAGNOSIS — I130 Hypertensive heart and chronic kidney disease with heart failure and stage 1 through stage 4 chronic kidney disease, or unspecified chronic kidney disease: Secondary | ICD-10-CM | POA: Diagnosis not present

## 2024-04-08 DIAGNOSIS — I428 Other cardiomyopathies: Secondary | ICD-10-CM | POA: Diagnosis not present

## 2024-04-08 DIAGNOSIS — I5022 Chronic systolic (congestive) heart failure: Secondary | ICD-10-CM | POA: Insufficient documentation

## 2024-04-08 DIAGNOSIS — I10 Essential (primary) hypertension: Secondary | ICD-10-CM | POA: Diagnosis not present

## 2024-04-08 DIAGNOSIS — G4733 Obstructive sleep apnea (adult) (pediatric): Secondary | ICD-10-CM | POA: Diagnosis not present

## 2024-04-08 DIAGNOSIS — N184 Chronic kidney disease, stage 4 (severe): Secondary | ICD-10-CM | POA: Diagnosis not present

## 2024-04-08 DIAGNOSIS — R1900 Intra-abdominal and pelvic swelling, mass and lump, unspecified site: Secondary | ICD-10-CM | POA: Diagnosis not present

## 2024-04-08 DIAGNOSIS — K589 Irritable bowel syndrome without diarrhea: Secondary | ICD-10-CM | POA: Insufficient documentation

## 2024-04-08 DIAGNOSIS — Z794 Long term (current) use of insulin: Secondary | ICD-10-CM | POA: Diagnosis not present

## 2024-04-08 DIAGNOSIS — I2510 Atherosclerotic heart disease of native coronary artery without angina pectoris: Secondary | ICD-10-CM | POA: Insufficient documentation

## 2024-04-08 LAB — COMPREHENSIVE METABOLIC PANEL WITH GFR
ALT: 204 U/L — ABNORMAL HIGH (ref 0–44)
AST: 117 U/L — ABNORMAL HIGH (ref 15–41)
Albumin: 5 g/dL (ref 3.5–5.0)
Alkaline Phosphatase: 250 U/L — ABNORMAL HIGH (ref 38–126)
Anion gap: 18 — ABNORMAL HIGH (ref 5–15)
BUN: 74 mg/dL — ABNORMAL HIGH (ref 6–20)
CO2: 25 mmol/L (ref 22–32)
Calcium: 9.8 mg/dL (ref 8.9–10.3)
Chloride: 100 mmol/L (ref 98–111)
Creatinine, Ser: 2.79 mg/dL — ABNORMAL HIGH (ref 0.44–1.00)
GFR, Estimated: 22 mL/min — ABNORMAL LOW
Glucose, Bld: 74 mg/dL (ref 70–99)
Potassium: 4 mmol/L (ref 3.5–5.1)
Sodium: 143 mmol/L (ref 135–145)
Total Bilirubin: 0.5 mg/dL (ref 0.0–1.2)
Total Protein: 8.5 g/dL — ABNORMAL HIGH (ref 6.5–8.1)

## 2024-04-08 LAB — PRO BRAIN NATRIURETIC PEPTIDE: Pro Brain Natriuretic Peptide: 255 pg/mL

## 2024-04-08 MED ORDER — TORSEMIDE 20 MG PO TABS: 60.0000 mg | ORAL_TABLET | Freq: Every day | ORAL | 5 refills | Status: AC

## 2024-04-08 MED ORDER — FUROSEMIDE 10 MG/ML IJ SOLN
INTRAMUSCULAR | Status: AC
  Filled 2024-04-08: qty 8

## 2024-04-08 MED ORDER — TORSEMIDE 20 MG PO TABS: 80.0000 mg | ORAL_TABLET | Freq: Every day | ORAL | 5 refills | Status: DC

## 2024-04-08 MED ORDER — DOXAZOSIN MESYLATE 4 MG PO TABS: 8.0000 mg | ORAL_TABLET | Freq: Every day | ORAL | 5 refills | Status: AC

## 2024-04-08 MED ORDER — FUROSEMIDE 10 MG/ML IJ SOLN
80.0000 mg | Freq: Once | INTRAMUSCULAR | Status: AC
  Administered 2024-04-08: 80 mg via INTRAVENOUS

## 2024-04-08 NOTE — Telephone Encounter (Signed)
 Advanced Heart Failure Patient Advocate Encounter  Prior authorization for Furoscix  has been submitted and approved. Test billing returns :  [phone] for 1 day supply $1547.97 for 3 day supply $2043.56 for 5 day supply  Key: [id number] Effective: 04/08/2024 to 03/27/2025  Rachel DEL, CPhT Rx Patient Advocate Phone: [phone]

## 2024-04-08 NOTE — Patient Instructions (Signed)
 Medication Changes:  INCREASE TORSEMIDE  TO 80MG  ONCE DAILY STARTING TOMORROW   IV LASIX  TODAY   INCREASE DOXAZOSIN  (CARDURA ) TO 8MG  ONCE DAILY   Lab Work:  Labs done today, your results will be available in MyChart, we will contact you for abnormal readings.  Follow-Up in: NEXT WEEK AS SCHEDULED   At the Advanced [facility] Clinic, you and your health needs are our priority. We have a designated team specialized in the treatment of Heart Failure. This Care Team includes your primary Heart Failure Specialized Cardiologist (physician), Advanced Practice Providers (APPs- Physician Assistants and Nurse Practitioners), and Pharmacist who all work together to provide you with the care you need, when you need it.   You may see any of the following providers on your designated Care Team at your next follow up:  Dr. Toribio Fuel Dr. Ezra Shuck Dr. Odis Brownie Greig Mosses, NP [place]affie Shed, [place] [facility], [place] Beckey Coe, NP Jordan Lee, NP Tinnie Redman, PharmD   Please be sure to bring in all your medications bottles to every appointment.   Need to Contact Us :  If you have any questions or concerns before your next appointment please send us  a message through [place] or call our office at [phone].    TO LEAVE A MESSAGE FOR THE NURSE SELECT OPTION 2, PLEASE LEAVE A MESSAGE INCLUDING: YOUR NAME DATE OF BIRTH CALL BACK NUMBER REASON FOR CALL**this is important as we prioritize the call backs  YOU WILL RECEIVE A CALL BACK THE SAME DAY AS LONG AS YOU CALL BEFORE 4:00 PM

## 2024-04-08 NOTE — Progress Notes (Signed)
 Paramedicine Encounter    Patient ID: Jennifer [name], female    DOB: October 07, 1990, 34 y.o.   MRN: [id number]   Pt was seen in clinic last Friday and metolazone  was added ofr 2 days. She had no increased output from that, her weight is up and she feels more bloated. She was seen in clinic today too, her doxazosin  was increased to 8mg  and torsemide  80mg  starting tomor. She got IV lasix  today in clinic.    Still waiting on vit d and levocetirizine.  -pt reports her kidney doc advised she does not need to be on vit d anymore   Meds verified and pill box refilled.  [name] going out of town thurs-sun.    Weight today-154 Weight last week-152     Patient Care Team: [name] as PCP - General (Physician Assistant) Tommas Pears, MD as PCP - Endocrinology (Endocrinology) Ladona Heinz, MD as PCP - Cardiology (Cardiology)  Patient Active Problem List   Diagnosis Date Noted   Drug-seeking behavior 03/30/2024   Pure hypercholesterolemia 02/24/2024   CKD (chronic kidney disease) stage 4, GFR 15-29 ml/min (HCC) - baseline scr 2.8-3.0 02/23/2024   Hyperglycemia due to diabetes mellitus (HCC) 02/23/2024   NICM (nonischemic cardiomyopathy) (HCC) 02/23/2024   Skin infection 01/02/2024   Controlled type 2 diabetes mellitus with hyperglycemia (HCC) 08/31/2023   Sleep apnea 08/30/2023   Chronic pancreatitis (HCC) 08/15/2023   Peripheral neuropathy 04/10/2023   Coronary artery disease due to lipid rich plaque 01/18/2023   Elevated liver enzymes 01/02/2023   Bladder wall thickening 01/02/2023   Hyperglycemia in setting of T2DM 09/19/2022   ASCUS of cervix with negative high risk HPV 05/31/2022   Pulmonary hypertension (HCC) 02/06/2022   NASH (nonalcoholic steatohepatitis) 02/06/2022   Obesity, Class I, BMI 30-34.9 02/06/2022   Acute on chronic combined systolic and diastolic CHF (congestive heart failure) (HCC) 12/02/2021   Pancytopenia (HCC) 12/02/2021   Amenorrhea  12/02/2021   Hepatic steatosis 09/27/2021   Hx of insulin  dependent diabetes mellitus 08/14/2021   Gastroparesis due to DM (HCC) 05/12/2021   History of astrocytoma of brain 02/09/2021   Type 2 diabetes mellitus with hyperglycemia, with long-term current use of insulin  (HCC) 01/25/2021   Elevated transaminase level    Orthostatic hypotension 11/28/2020   Acute combined systolic and diastolic heart failure (HCC)    Nonischemic cardiomyopathy (HCC)    Chronic systolic congestive heart failure (HCC) 11/23/2020   Primary hypertension 11/23/2020   Familial hypertriglyceridemia 11/23/2020   Prolonged QT interval 11/23/2020   Polycystic ovary syndrome 01/30/2020   Moderate aortic insufficiency 08/16/2019   Moderate mitral regurgitation 08/16/2019   Lipoprotein lipase deficiency, familial 06/19/2019   Microalbuminuria due to type 1 diabetes mellitus (HCC) 06/19/2019   Type 2 diabetes mellitus with hyperlipidemia (HCC) 10/10/2018   Hypothyroidism 10/10/2018   Current Medications[1] Allergies[2]    Social History   Socioeconomic History   Marital status: Divorced    Spouse name: Not on file   Number of children: 0   Years of education: Not on file   Highest education level: Not on file  Occupational History   Occupation: Dance Movement Psychotherapist   Occupation: N/A  Tobacco Use   Smoking status: Never   Smokeless tobacco: Never  Vaping Use   Vaping status: Never Used  Substance and Sexual Activity   Alcohol  use: Never   Drug use: Never   Sexual activity: Yes  Other Topics Concern   Not on file  Social History Narrative  Not on file   Social Drivers of Health   Tobacco Use: Low Risk (04/08/2024)   Patient History    Smoking Tobacco Use: Never    Smokeless Tobacco Use: Never    Passive Exposure: Not on file  Financial Resource Strain: Medium Risk (01/22/2024)   Received from [facility]   Overall Financial Resource Strain (CARDIA)    How hard is it for you to pay for the very  basics like food, housing, medical care, and heating?: Somewhat hard  Food Insecurity: No Food Insecurity (03/30/2024)   Epic    Worried About Programme Researcher, Broadcasting/film/video in the Last Year: Never true    Ran Out of Food in the Last Year: Never true  Recent Concern: Food Insecurity - Food Insecurity Present (01/22/2024)   Received from [facility]   Epic    Within the past 12 months, you worried that your food would run out before you got the money to buy more.: Sometimes true    Within the past 12 months, the food you bought just didn't last and you didn't have money to get more.: Sometimes true  Transportation Needs: No Transportation Needs (03/30/2024)   Epic    Lack of Transportation (Medical): No    Lack of Transportation (Non-Medical): No  Recent Concern: Transportation Needs - Unmet Transportation Needs (01/22/2024)   Received from [facility]    Lack of Transportation (Medical): Patient declined    In the past 12 months, has lack of transportation kept you from meetings, work, or from getting things needed for daily living?: Yes  Physical Activity: Inactive (01/22/2024)   Received from [facility]   Exercise Vital Sign    On average, how many days per week do you engage in moderate to strenuous exercise (like a brisk walk)?: 0 days    Minutes of Exercise per Session: Not on file  Stress: No Stress Concern Present (01/22/2024)   Received from [facility] - Occupational Stress Questionnaire    Do you feel stress - tense, restless, nervous, or anxious, or unable to sleep at night because your mind is troubled all the time - these days?: Not at all  Social Connections: Socially Integrated (01/22/2024)   Received from [facility]   Social Network    How would you rate your social network (family, work, friends)?: Good participation with social networks  Intimate Partner Violence: Not At Risk (03/30/2024)   Epic    Fear of Current or  Ex-Partner: No    Emotionally Abused: No    Physically Abused: No    Sexually Abused: No  Depression (PHQ2-9): Low Risk (12/10/2021)   Depression (PHQ2-9)    PHQ-2 Score: 0  Alcohol  Screen: Low Risk (12/10/2021)   Alcohol  Screen    Last Alcohol  Screening Score (AUDIT): 0  Housing: Low Risk (03/30/2024)   Epic    Unable to Pay for Housing in the Last Year: No    Number of Times Moved in the Last Year: 0    Homeless in the Last Year: No  Utilities: Not At Risk (03/30/2024)   Epic    Threatened with loss of utilities: No  Health Literacy: Not on file    Physical Exam      Future Appointments  Date Time Provider Department Center  04/16/2024  8:30 AM [facility] PA/NP [facility] None  04/26/2024 11:00 AM Hope Almarie ORN, NP [facility] [address]  05/07/2024  3:15 PM Alm Jennifer SAILOR, DO [facility] None       Izetta Quivers, Paramedic [phone] [facility] Paramedic  04/08/2024     [1]  Current Outpatient Medications:    albuterol  (VENTOLIN  HFA) 108 (90 Base) MCG/ACT inhaler, Inhale 2 puffs into the lungs daily as needed for wheezing or shortness of breath., Disp: , Rfl:    allopurinol  (ZYLOPRIM ) 100 MG tablet, Take 1 tablet (100 mg total) by mouth daily., Disp: 90 tablet, Rfl: 3   amitriptyline  (ELAVIL ) 10 MG tablet, Take 10 mg by mouth. (Patient taking differently: Take 10 mg by mouth at bedtime.), Disp: , Rfl:    aspirin  EC 81 MG tablet, Take 1 tablet (81 mg total) by mouth daily. Swallow whole., Disp: 30 tablet, Rfl: 1   atorvastatin  (LIPITOR ) 80 MG tablet, Take 1 tablet (80 mg total) by mouth every evening., Disp: 30 tablet, Rfl: 0   azelastine  (ASTELIN ) 0.1 % nasal spray, Place 2 sprays into both nostrils daily as needed for rhinitis or allergies., Disp: , Rfl:    BAQSIMI ONE PACK 3 MG/DOSE POWD, Place 1 Dose into the nose once as needed (hypoglycemia)., Disp: , Rfl:    Biotin 89999 MCG TABS, Take 1 tablet by mouth daily., Disp: , Rfl:    calcitRIOL  (ROCALTROL )  0.25 MCG capsule, Take 0.25 mcg by mouth daily., Disp: , Rfl:    cephALEXin  (KEFLEX ) 500 MG capsule, Take 1 capsule (500 mg total) by mouth 2 (two) times daily for 5 days., Disp: 10 capsule, Rfl: 0   dicyclomine  (BENTYL ) 20 MG tablet, Take 1 tablet (20 mg total) by mouth 2 (two) times daily., Disp: 20 tablet, Rfl: 0   doxazosin  (CARDURA ) 4 MG tablet, Take 2 tablets (8 mg total) by mouth daily., Disp: 60 tablet, Rfl: 5   DULoxetine  (CYMBALTA ) 60 MG capsule, Take 60 mg by mouth at bedtime., Disp: , Rfl:    ergocalciferol  (VITAMIN D2) 1.25 MG (50000 UT) capsule, Take 1 capsule (50,000 Units total) by mouth every Sunday., Disp: 4 capsule, Rfl: 0   Glucose Blood (BLOOD GLUCOSE TEST STRIPS) STRP, 1 each by Does not apply route 3 (three) times daily. Use as directed to check blood sugar. May dispense any manufacturer covered by patient's insurance and fits patient's device., Disp: 100 strip, Rfl: 0   HUMALOG  KWIKPEN 100 UNIT/ML KwikPen, Inject 60 Units into the skin in the morning, at noon, and at bedtime. (Patient taking differently: Inject 60 Units into the skin in the morning, at noon, and at bedtime.), Disp: , Rfl:    HUMULIN  R U-500 KWIKPEN 500 UNIT/ML KwikPen, Inject 300 Units into the skin in the morning and at bedtime. (Patient taking differently: Inject 150 Units into the skin in the morning and at bedtime.), Disp: , Rfl:    hydrALAZINE  (APRESOLINE ) 100 MG tablet, Take 1 tablet (100 mg total) by mouth every 8 (eight) hours., Disp: 90 tablet, Rfl: 3   Insulin  Pen Needle (PEN NEEDLES) 31G X 5 MM MISC, Use as directed 3 (three) times daily., Disp: 100 each, Rfl: 0   isosorbide  mononitrate (IMDUR ) 60 MG 24 hr tablet, Take 1 tablet (60 mg total) by mouth daily., Disp: 30 tablet, Rfl: 3   ivabradine  (CORLANOR ) 7.5 MG TABS tablet, Take 1 tablet (7.5 mg total) by mouth 2 (two) times daily with a meal., Disp: 60 tablet, Rfl: 11   Lancet Device MISC, 1 each by Does not apply route 3 (three) times daily. May  dispense any manufacturer covered by patient's  insurance., Disp: 1 each, Rfl: 0   Lancets MISC, 1 each by Does not apply route 3 (three) times daily. Use as directed to check blood sugar. May dispense any manufacturer covered by patient's insurance and fits patient's device., Disp: 100 each, Rfl: 0   lansoprazole  (PREVACID ) 30 MG capsule, TAKE 1 CAPSULE (30 MG TOTAL) BY MOUTH 2 (TWO) TIMES DAILY BEFORE A MEAL., Disp: 60 capsule, Rfl: 2   levocetirizine (XYZAL ) 5 MG tablet, Take 5 mg by mouth at bedtime., Disp: , Rfl:    levothyroxine  (SYNTHROID ) 150 MCG tablet, Take 150 mcg by mouth daily before breakfast., Disp: , Rfl:    metolazone  (ZAROXOLYN ) 2.5 MG tablet, TAKE 1 TABLET TODAY 1/9, AND 1 TABLET TOMORROW 1/10-THEN ONLY AS DIRECTED BY [facility] CLINIC, Disp: 6 tablet, Rfl: 0   [place] Sodium (TRYNGOLZA) 80 MG/0.8ML SOAJ, Inject 1 Units into the skin every 30 (thirty) days., Disp: , Rfl:    pregabalin  (LYRICA ) 300 MG capsule, Take 300 mg by mouth at bedtime., Disp: , Rfl:    promethazine  (PHENERGAN ) 25 MG tablet, Take 25 mg by mouth at bedtime., Disp: , Rfl:    Safety Seal Miscellaneous MISC, Hormonic Hair Solution with minoxidil USP 7% and finasteride USP 0.05% - apply to affected areas daily every morning., Disp: 30 mL, Rfl: 2   SUMAtriptan  (IMITREX ) 100 MG tablet, Take 100 mg by mouth daily as needed., Disp: , Rfl:    torsemide  (DEMADEX ) 20 MG tablet, Take 4 tablets (80 mg total) by mouth daily., Disp: 120 tablet, Rfl: 5   tretinoin  (RETIN-A ) 0.05 % cream, Apply 1 Application topically at bedtime., Disp: , Rfl:  [2]  Allergies Allergen Reactions   Icosapent  Ethyl (Epa Ethyl Ester) (Fish) Hives   Ketamine Other (See Comments)    In a vegetative state for 15 minutes, per pt   Maitake Itching and Other (See Comments)    Itchy throat   Morphine  Hives, Itching, Rash and Other (See Comments)    Sore throat, too   Mushroom Other (See Comments)    Pt has never had mushrooms but tested positive on  allergy test   Penicillins Hives, Itching and Rash    Tolerates cephalosporins   Shellfish Allergy Other (See Comments)    Pt has never had shellfish, but tested positive on allergy test. Pt states contrast in CT is okay.   Fentanyl  Nausea And Vomiting   Prednisone Rash

## 2024-04-08 NOTE — Progress Notes (Signed)
 "   Advanced Heart Failure Clinic Note   PCP: Emilio Joesph DEL, PA-C Primary Cardiologist: [name], MD HF Cardiologist: Dr. [name]    HPI: Jennifer [name] is a 34 y.o. 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   Admitted 8/22 with vasculitis (no pancreatitis). Her TG were found to be 4030. Echo 11/28/20 EF 20-25% with moderate RV dysfunction and mild septal flattening. Started on DBA 2.5. cMRI c/w prior myocarditis vs sarcoid (see below). Mod pericardial effusion also noted. LVEF 29%. RV normal. She underwent R/LHC showing single vessel RCA occlusion, RA 1, LVEDP 17, Fick CO 5.6/CI 3.88. She was able to be weaned off inotropes and GDMT titrated.     Post hospital follow up she was hypervolemic, ReDs 43%. She followed up with Dr. Fredrica with [facility] 9/22 for a 2nd opinion. He placed LifeVest and switched beta blocker to Toprol  XL.   Echo 10/22: EF up to 45-50%, grade II DD, RV ok. LifeVest off.   Treated in ED 10/22 for pancreatitis, then admitted 11/22 for hyperglycemia.   Echo 12/22 EF 45%, RV normal.    Follow up 5/23, weight up 16 lbs, ReDs 27%. Arranged for RHC to better assess hemodynamics and see if abdominal symptoms related to right-sided HF due to high-output state or non-cardiac. This showed elevated filling pressures and normal cardiac ouput. Mild to moderate mixed PH with prominent v-waves in RA, suggestive of significant TR. Given IV lasix  and metolazone  2.5 added weekly to diuretic regimen.    Admitted 5/23 with AKI and hyperkalemia. Given IVF, insulin , calcium  gluconate and Lokelma . Echo showed EF 45-50%, RV normal, mild-mod MR, RVSP 30. Only mild TR. Restarted on torsemide  40 bid (lower dose) and Entresto  49/51 bid. Arranged paramedicine, discharged home 143 lbs.   Seen 7/23 at [facility] for 2nd opinion, felt to be congested and recommended increasing torsemide  to 60 mg bid and instructed f/u in 3 months.   Admitted 9/23 for volume overload in setting of RV failure and AKI. Required milrinone . Underwent Cardiomems implant. RHC showed RA 15, PA 37/23 (28), PCWP 16, Fick CO/CI 4.2/2.6, Thermo CO/CI 3.7/2.3, PVR 4.3, PA sat 51%, 54%, PAPi 0.93  Previously being followed by Dr. Kimble at [facility]. Had renal biopsy 10/23 which showed DM and HTN nephropathy. They discussed starting HD soon. Now being followed by [facility].   Echo 10/04/22 EF 55% RV ok. Mild AI   Continued to have multiple hospitalizations and ED visits for hyperglycemia, hyperTG (>5,000), uncontrolled HTN, and abdominal pain.   Admitted 9/25 for a/c CHF in setting of poor med compliance. Echo EF 45-50%, RV mildly reduced. She was diuresed w/ IV Lasix  gtt and metolazone , sCr bumped to >3.0. Underwent RHC which showed RA 6, PA 46/23 (mean 31), PCWP 13, FICK CI 1.8, TPG 18, PVR 6.4 WU, PAPi 3.8, severely elevated SVR 2857. Significant respiratory variation, but cardiomems correlates well with RHC numbers. Of note it was recommended, when evaluating cardiomems, would look at the PA tracings and use the end expiratory values when looking at [facility]. GDMT titrated and she was re-enrolled in paramedicine. Discharged home, weight 139 lbs.  Admitted 12/28/23, sent from AHF clinic, for a/c dHF. She was diuresed with IV lasix  and discharged home, weight 145 lbs.  Readmitted 11/25 and 01/26 with acute on chronic CHF. Prior to most recent admission she had gained  10-15 lb. Diuresed with IV lasix  then transitioned to po torsemide  60 mg daily (prior home dose).   Seen in clinic for follow-up last week. PAd was 29 (goal 24). Instructed to take 2.5 mg metolazone  X 2 days.  [name] here today for close CHF follow-up. Last CardioMems reading was from 01/09. She was unable to get readings over the weekend and has contacted the company for  troubleshooting.  Weight was down to 150 lb on 1/10 with Metolazone , took again on 1/11 and 1/12. Home weight up to 154 today. She notes more shortness of breath and wheezing with exertion. Increased abdominal bloating. Reports she has cut back fluid intake but still probably drinks too much. Feels thirsty all of the time. Reviewed Paramedicine notes, seems to be taking cardiac medications correctly.   ROS: All systems reviewed and negative except as per HPI.   Past Medical History:  Diagnosis Date   Afib (HCC) 05/12/2021   Brain tumor (HCC) 03/29/1995   astrocytoma   CHF (congestive heart failure) (HCC)    Cholesterosis    CKD (chronic kidney disease) stage 4, GFR 15-29 ml/min (HCC) 05/13/2021   DM (diabetes mellitus) (HCC) 10/10/2018   Fatty liver    Gastroparesis due to DM (HCC) 05/12/2021   HTN (hypertension) 10/10/2018   Hypothyroidism 10/10/2018   Lipoprotein deficiency    Lung disease    longevity long term   Pancreatitis    Polycystic ovary syndrome    Small intestinal bacterial overgrowth (SIBO) 08/26/2021   Type 2 MI (myocardial infarction) (HCC) 01/19/2023   Current Outpatient Medications  Medication Sig Dispense Refill   albuterol  (VENTOLIN  HFA) 108 (90 Base) MCG/ACT inhaler Inhale 2 puffs into the lungs daily as needed for wheezing or shortness of breath.     allopurinol  (ZYLOPRIM ) 100 MG tablet Take 1 tablet (100 mg total) by mouth daily. 90 tablet 3   amitriptyline  (ELAVIL ) 10 MG tablet Take 10 mg by mouth. (Patient taking differently: Take 10 mg by mouth at bedtime.)     aspirin  EC 81 MG tablet Take 1 tablet (81 mg total) by mouth daily. Swallow whole. 30 tablet 1   atorvastatin  (LIPITOR ) 80 MG tablet Take 1 tablet (80 mg total) by mouth every evening. 30 tablet 0   azelastine  (ASTELIN ) 0.1 % nasal spray Place 2 sprays into both nostrils daily as needed for rhinitis or allergies.     BAQSIMI ONE PACK 3 MG/DOSE POWD Place 1 Dose into the nose once as needed  (hypoglycemia).     Biotin 89999 MCG TABS Take 1 tablet by mouth daily.     calcitRIOL  (ROCALTROL ) 0.25 MCG capsule Take 0.25 mcg by mouth daily.     cephALEXin  (KEFLEX ) 500 MG capsule Take 1 capsule (500 mg total) by mouth 2 (two) times daily for 5 days. 10 capsule 0   dicyclomine  (BENTYL ) 20 MG tablet Take 1 tablet (20 mg total) by mouth 2 (two) times daily. 20 tablet 0   doxazosin  (CARDURA ) 4 MG tablet Take 1 tablet (4 mg total) by mouth daily. 30 tablet 5   DULoxetine  (CYMBALTA ) 60 MG capsule Take 60 mg by mouth at bedtime.     ergocalciferol  (VITAMIN D2) 1.25 MG (50000 UT) capsule Take 1 capsule (50,000 Units total) by mouth every Sunday. 4 capsule 0   Glucose Blood (BLOOD GLUCOSE TEST STRIPS) STRP 1 each by Does not apply route 3 (three) times daily. Use as directed to check blood sugar. May dispense any manufacturer covered by patient's  insurance and fits patient's device. 100 strip 0   HUMALOG  KWIKPEN 100 UNIT/ML KwikPen Inject 60 Units into the skin in the morning, at noon, and at bedtime. (Patient taking differently: Inject 60 Units into the skin in the morning, at noon, and at bedtime.)     HUMULIN  R U-500 KWIKPEN 500 UNIT/ML KwikPen Inject 300 Units into the skin in the morning and at bedtime. (Patient taking differently: Inject 150 Units into the skin in the morning and at bedtime.)     hydrALAZINE  (APRESOLINE ) 100 MG tablet Take 1 tablet (100 mg total) by mouth every 8 (eight) hours. 90 tablet 3   Insulin  Pen Needle (PEN NEEDLES) 31G X 5 MM MISC Use as directed 3 (three) times daily. 100 each 0   isosorbide  mononitrate (IMDUR ) 60 MG 24 hr tablet Take 1 tablet (60 mg total) by mouth daily. 30 tablet 3   ivabradine  (CORLANOR ) 7.5 MG TABS tablet Take 1 tablet (7.5 mg total) by mouth 2 (two) times daily with a meal. 60 tablet 11   Lancet Device MISC 1 each by Does not apply route 3 (three) times daily. May dispense any manufacturer covered by patient's insurance. 1 each 0   Lancets MISC 1  each by Does not apply route 3 (three) times daily. Use as directed to check blood sugar. May dispense any manufacturer covered by patient's insurance and fits patient's device. 100 each 0   lansoprazole  (PREVACID ) 30 MG capsule TAKE 1 CAPSULE (30 MG TOTAL) BY MOUTH 2 (TWO) TIMES DAILY BEFORE A MEAL. 60 capsule 2   levocetirizine (XYZAL ) 5 MG tablet Take 5 mg by mouth at bedtime.     levothyroxine  (SYNTHROID ) 150 MCG tablet Take 150 mcg by mouth daily before breakfast.     metolazone  (ZAROXOLYN ) 2.5 MG tablet TAKE 1 TABLET TODAY 1/9, AND 1 TABLET TOMORROW 1/10-THEN ONLY AS DIRECTED BY HF CLINIC 6 tablet 0   [place] Sodium (TRYNGOLZA) 80 MG/0.8ML SOAJ Inject 1 Units into the skin every 30 (thirty) days.     pregabalin  (LYRICA ) 300 MG capsule Take 300 mg by mouth at bedtime.     promethazine  (PHENERGAN ) 25 MG tablet Take 25 mg by mouth at bedtime.     Safety Seal Miscellaneous MISC Hormonic Hair Solution with minoxidil USP 7% and finasteride USP 0.05% - apply to affected areas daily every morning. 30 mL 2   SUMAtriptan  (IMITREX ) 100 MG tablet Take 100 mg by mouth daily as needed.     torsemide  (DEMADEX ) 20 MG tablet Take 3 tablets (60 mg total) by mouth daily. 90 tablet 2   tretinoin  (RETIN-A ) 0.05 % cream Apply 1 Application topically at bedtime.     No current facility-administered medications for this encounter.   Allergies  Allergen Reactions   Icosapent  Ethyl (Epa Ethyl Ester) (Fish) Hives   Ketamine Other (See Comments)    In a vegetative state for 15 minutes, per pt   Maitake Itching and Other (See Comments)    Itchy throat   Morphine  Hives, Itching, Rash and Other (See Comments)    Sore throat, too   Mushroom Other (See Comments)    Pt has never had mushrooms but tested positive on allergy test   Penicillins Hives, Itching and Rash    Tolerates cephalosporins   Shellfish Allergy Other (See Comments)    Pt has never had shellfish, but tested positive on allergy test. Pt states  contrast in CT is okay.   Fentanyl  Nausea And Vomiting   Prednisone Rash  Social History   Socioeconomic History   Marital status: Divorced    Spouse name: Not on file   Number of children: 0   Years of education: Not on file   Highest education level: Not on file  Occupational History   Occupation: Dance Movement Psychotherapist   Occupation: N/A  Tobacco Use   Smoking status: Never   Smokeless tobacco: Never  Vaping Use   Vaping status: Never Used  Substance and Sexual Activity   Alcohol  use: Never   Drug use: Never   Sexual activity: Yes  Other Topics Concern   Not on file  Social History Narrative   Not on file   Social Drivers of Health   Tobacco Use: Low Risk (04/08/2024)   Patient History    Smoking Tobacco Use: Never    Smokeless Tobacco Use: Never    Passive Exposure: Not on file  Financial Resource Strain: Medium Risk (01/22/2024)   Received from [facility]   Overall Financial Resource Strain (CARDIA)    How hard is it for you to pay for the very basics like food, housing, medical care, and heating?: Somewhat hard  Food Insecurity: No Food Insecurity (03/30/2024)   Epic    Worried About Programme Researcher, Broadcasting/film/video in the Last Year: Never true    Ran Out of Food in the Last Year: Never true  Recent Concern: Food Insecurity - Food Insecurity Present (01/22/2024)   Received from [facility]   Epic    Within the past 12 months, you worried that your food would run out before you got the money to buy more.: Sometimes true    Within the past 12 months, the food you bought just didn't last and you didn't have money to get more.: Sometimes true  Transportation Needs: No Transportation Needs (03/30/2024)   Epic    Lack of Transportation (Medical): No    Lack of Transportation (Non-Medical): No  Recent Concern: Transportation Needs - Unmet Transportation Needs (01/22/2024)   Received from [facility]    Lack of Transportation (Medical): Patient declined    In the past 12  months, has lack of transportation kept you from meetings, work, or from getting things needed for daily living?: Yes  Physical Activity: Inactive (01/22/2024)   Received from [facility]   Exercise Vital Sign    On average, how many days per week do you engage in moderate to strenuous exercise (like a brisk walk)?: 0 days    Minutes of Exercise per Session: Not on file  Stress: No Stress Concern Present (01/22/2024)   Received from [facility] - Occupational Stress Questionnaire    Do you feel stress - tense, restless, nervous, or anxious, or unable to sleep at night because your mind is troubled all the time - these days?: Not at all  Social Connections: Socially Integrated (01/22/2024)   Received from [facility]   Social Network    How would you rate your social network (family, work, friends)?: Good participation with social networks  Intimate Partner Violence: Not At Risk (03/30/2024)   Epic    Fear of Current or Ex-Partner: No    Emotionally Abused: No    Physically Abused: No    Sexually Abused: No  Depression (PHQ2-9): Low Risk (12/10/2021)   Depression (PHQ2-9)    PHQ-2 Score: 0  Alcohol  Screen: Low Risk (12/10/2021)   Alcohol  Screen    Last Alcohol  Screening Score (AUDIT): 0  Housing: Low Risk (03/30/2024)   Epic    Unable to Pay for Housing in the Last Year: No    Number of Times Moved in the Last Year: 0    Homeless in the Last Year: No  Utilities: Not At Risk (03/30/2024)   Epic    Threatened with loss of utilities: No  Health Literacy: Not on file   Family History  Problem Relation Age of Onset   Diabetes Mother    Hypertension Mother    Hyperlipidemia Mother    Thyroid  disease Mother    Hypertension Father    Diabetes Father    Pancreatic cancer Paternal Aunt    Pancreatic cancer Paternal Uncle    Colon cancer Neg Hx    Esophageal cancer Neg Hx    Stomach cancer Neg Hx    Wt Readings from Last 3 Encounters:   04/08/24 71.7 kg (158 lb)  04/03/24 68.9 kg (152 lb)  04/01/24 70.8 kg (156 lb 1.6 oz)   BP (!) 162/66   Pulse 99   Wt 71.7 kg (158 lb)   SpO2 98%   BMI 34.19 kg/m   Physical Exam  General:  Ambulated into clinic. Chronically ill appearing. Neck: JVP difficult Cor: Regular rate & rhythm. No murmurs. Lungs: clear Abdomen: Taut Extremities: no edema Neuro: alert & orientedx3. Affect pleasant   Cardiomems (personally reviewed): No new, 16% compliance  ReDs reading: n/a  ASSESSMENT & PLAN: Nonischemic cardiomyopathy, recovered EF, previously with HFrEF (EF 29%) - prev reduced EF to 29%.  - Echo 9/25: EF 45-50%, RV mildly reduced - RHC 9/25: RA 6, PA 46/23 (mean 31), PCWP 13, FICK CI 1.8, TPG 18, PVR 6.4 WU, PAPi 3.8, severely elevated SVR 2857 - NYHA III. Volume difficult on exam. CardioMems elevated on 1/9. Has been unable to check readings at home, she is contacting rep about new pillow. She had little response to metolazone  over the weekend. Weight is up and more symptomatic.  - Needs prior auth for furoscix  (unlikely to get today). She took Torsemide  60 mg this am. Send to infusion clinic for 80 mg lasix  IV. Starting tomorrow, increase Torsemide  to 80 mg daily. - GDMT limited by CKD - Continue hydralazine  100 mg tid + Imdur  60 mg daily - Continue ivabradine  7.5 mg bid. - No beta blocker with low CI on RHC - CMET/BNP today - Appreciate paramedicine  2. CAD - Single vessel RCA occlusion (small co-dominant vessel) on cardiac cath 09/08. - No s/s angina - Continue medical management. On aspirin  + statin  3.  CKD IV - Scr baseline variable with frequent AKIs  - Previously followed by Dr. Kimble ([facility]phrology at [facility]) - Had renal biopsy (10/23) and showed DM and HTN nephropathy.  - Now followed by [facility]. Reports she will be completing HD teaching soon. - Baseline SCr now ~ 3 - Labs today   4. Pulmonary hypertension - Mild to moderate on RHC 5/23. PVR 3.7 WU. - RHC 9/23:  RA 15, PA 37/23 (28), PCWP 16,  Thermo CO/CI 3.7/2.3, PVR 4.3, PA sat 51%, 54%, PAPi 0.93  - RHC 9/25 RA 6, PA 46/23 (mean 31), PCWP 13, FICK CI 1.8, TPG 18, PVR 6.4 WU, PAPi 3.8, severely elevated SVR 2857 - Echo 9/25, RV mildly reduced  - V/Q negative - Off sildenafil  d/t dizziness and low BP - No change   5. TR - Prominent v-waves in RA tracing on RHC 5/23. - prev stable mild/mod - Trivial on echo 9/23  -  Mild on echo 9/25  6. HTN - BP above goal - Increase cardura  to 8 mg daily. Other meds as above.   7. OSA - Unable to tolerate CPAP mask - Follows with [facility]  8. Severe hyperTG - Due to LPL deficiency w/ recent pancreatitis. - Follows in [facility]id Clinic (Dr. Mona)  - Continue atorvastatin , off fenofibrate  d/t GFR  9. Type 3c DM - 2/2 chronic pancreatitis  - Follows with [facility] - On insulin   10. Chronic abdominal swelling/bloating - Seen at [facility] (11/20) for IBS, per chart review. - Had penumatosis intestinalis. She was started on pancreatic enzyme replacement. - Has DM gastroparesis - Suspect primarily fatty liver - no change  11. Poor Med Compliance/SDOH - Doing better with paramedicine back on board - Appreciate their assistance   Follow-up: 1 week with APP to assess volume status. [name] at high risk for readmission.   Legion Discher N, PA-C  9:13 AM  "

## 2024-04-11 ENCOUNTER — Ambulatory Visit (HOSPITAL_BASED_OUTPATIENT_CLINIC_OR_DEPARTMENT_OTHER): Admitting: Pulmonary Disease

## 2024-04-15 ENCOUNTER — Telehealth (HOSPITAL_COMMUNITY): Payer: Self-pay

## 2024-04-15 ENCOUNTER — Other Ambulatory Visit (HOSPITAL_COMMUNITY): Payer: Self-pay

## 2024-04-15 NOTE — Telephone Encounter (Signed)
 Called to confirm/remind patient of their appointment at the Advanced [facility] Failure Clinic on 04/16/24  8:30.   Appointment:   [] Confirmed  [x] Left mess   [] No answer/No voice mail  [] VM Full/unable to leave message  [] Phone not in service  Patient reminded to bring all medications and/or complete list.  Confirmed patient has transportation. Gave directions, instructed to utilize valet parking.

## 2024-04-15 NOTE — Progress Notes (Unsigned)
 Paramedicine Encounter    Patient ID: Jennifer [name], female    DOB: 05/12/1990, 34 y.o.   MRN: [id number]   Complaints-none  Edema-none   Compliance with meds-yes  Pill box filled-yes  If so, by whom-paramedic   Refills needed-  amitrptyline  Calcitrol  Atorvastatin   Duloxetine   Dexcom sensors  Lansoprazole   Levocetirizine   Pt was out of town this wknd. She was not able to take her meds yesterday due to the flight home and limited water  access to take meds. [name] back on track today.  She denies issues or complaints at this time. Denies sob, no dizziness, no c/p.  She has clinic appoint tomor.   Meds verified and pill box refilled.  Asked pharm to fill these meds for delivery this week.   She has a cyst type bump on her rt wrist area, it is right over her radial so I can feel a pulse thru it, so not sure if its just b/c where its sitting at or if it has vascular supply to it.  She has doc appoint this wed and will ask them about it.  It is same skin color so no bruising noted to it or anything.   She also  has clinic appoint tomor.  She will get an uber ride.  Will plan to meet her there.   BP (!) 142/80   Pulse 78   Resp 16   Wt 153 lb (69.4 kg)   SpO2 98%   BMI 33.11 kg/m  Weight yesterday-? Last visit weight-158 @ clinic   Patient Care Team: [name] as PCP - General (Physician Assistant) Tommas Pears, MD as PCP - Endocrinology (Endocrinology) Ladona Heinz, MD as PCP - Cardiology (Cardiology)  Patient Active Problem List   Diagnosis Date Noted   Drug-seeking behavior 03/30/2024   Pure hypercholesterolemia 02/24/2024   CKD (chronic kidney disease) stage 4, GFR 15-29 ml/min (HCC) - baseline scr 2.8-3.0 02/23/2024   Hyperglycemia due to diabetes mellitus (HCC) 02/23/2024   NICM (nonischemic cardiomyopathy) (HCC) 02/23/2024   Skin infection 01/02/2024   Controlled type 2 diabetes mellitus with hyperglycemia (HCC) 08/31/2023    Sleep apnea 08/30/2023   Chronic pancreatitis (HCC) 08/15/2023   Peripheral neuropathy 04/10/2023   Coronary artery disease due to lipid rich plaque 01/18/2023   Elevated liver enzymes 01/02/2023   Bladder wall thickening 01/02/2023   Hyperglycemia in setting of T2DM 09/19/2022   ASCUS of cervix with negative high risk HPV 05/31/2022   Pulmonary hypertension (HCC) 02/06/2022   NASH (nonalcoholic steatohepatitis) 02/06/2022   Obesity, Class I, BMI 30-34.9 02/06/2022   Acute on chronic combined systolic and diastolic CHF (congestive heart failure) (HCC) 12/02/2021   Pancytopenia (HCC) 12/02/2021   Amenorrhea 12/02/2021   Hepatic steatosis 09/27/2021   Hx of insulin  dependent diabetes mellitus 08/14/2021   Gastroparesis due to DM (HCC) 05/12/2021   History of astrocytoma of brain 02/09/2021   Type 2 diabetes mellitus with hyperglycemia, with long-term current use of insulin  (HCC) 01/25/2021   Elevated transaminase level    Orthostatic hypotension 11/28/2020   Acute combined systolic and diastolic heart failure (HCC)    Nonischemic cardiomyopathy (HCC)    Chronic systolic congestive heart failure (HCC) 11/23/2020   Primary hypertension 11/23/2020   Familial hypertriglyceridemia 11/23/2020   Prolonged QT interval 11/23/2020   Polycystic ovary syndrome 01/30/2020   Moderate aortic insufficiency 08/16/2019   Moderate mitral regurgitation 08/16/2019   Lipoprotein lipase deficiency, familial 06/19/2019   Microalbuminuria due  to type 1 diabetes mellitus (HCC) 06/19/2019   Type 2 diabetes mellitus with hyperlipidemia (HCC) 10/10/2018   Hypothyroidism 10/10/2018   Current Medications[1] Allergies[2]    Social History   Socioeconomic History   Marital status: Divorced    Spouse name: Not on file   Number of children: 0   Years of education: Not on file   Highest education level: Not on file  Occupational History   Occupation: Dance Movement Psychotherapist   Occupation: N/A  Tobacco Use    Smoking status: Never   Smokeless tobacco: Never  Vaping Use   Vaping status: Never Used  Substance and Sexual Activity   Alcohol  use: Never   Drug use: Never   Sexual activity: Yes  Other Topics Concern   Not on file  Social History Narrative   Not on file   Social Drivers of Health   Tobacco Use: Low Risk (04/08/2024)   Patient History    Smoking Tobacco Use: Never    Smokeless Tobacco Use: Never    Passive Exposure: Not on file  Financial Resource Strain: Medium Risk (01/22/2024)   Received from [facility]   Overall Financial Resource Strain (CARDIA)    How hard is it for you to pay for the very basics like food, housing, medical care, and heating?: Somewhat hard  Food Insecurity: No Food Insecurity (03/30/2024)   Epic    Worried About Programme Researcher, Broadcasting/film/video in the Last Year: Never true    Ran Out of Food in the Last Year: Never true  Recent Concern: Food Insecurity - Food Insecurity Present (01/22/2024)   Received from [facility]   Epic    Within the past 12 months, you worried that your food would run out before you got the money to buy more.: Sometimes true    Within the past 12 months, the food you bought just didn't last and you didn't have money to get more.: Sometimes true  Transportation Needs: No Transportation Needs (03/30/2024)   Epic    Lack of Transportation (Medical): No    Lack of Transportation (Non-Medical): No  Recent Concern: Transportation Needs - Unmet Transportation Needs (01/22/2024)   Received from [facility]    Lack of Transportation (Medical): Patient declined    In the past 12 months, has lack of transportation kept you from meetings, work, or from getting things needed for daily living?: Yes  Physical Activity: Inactive (01/22/2024)   Received from [facility]   Exercise Vital Sign    On average, how many days per week do you engage in moderate to strenuous exercise (like a brisk walk)?: 0 days    Minutes of Exercise per Session:  Not on file  Stress: No Stress Concern Present (01/22/2024)   Received from [facility] - Occupational Stress Questionnaire    Do you feel stress - tense, restless, nervous, or anxious, or unable to sleep at night because your mind is troubled all the time - these days?: Not at all  Social Connections: Socially Integrated (01/22/2024)   Received from [facility]   Social Network    How would you rate your social network (family, work, friends)?: Good participation with social networks  Intimate Partner Violence: Not At Risk (03/30/2024)   Epic    Fear of Current or Ex-Partner: No    Emotionally Abused: No    Physically Abused: No    Sexually Abused: No  Depression (PHQ2-9): Low Risk (  12/10/2021)   Depression (PHQ2-9)    PHQ-2 Score: 0  Alcohol  Screen: Low Risk (12/10/2021)   Alcohol  Screen    Last Alcohol  Screening Score (AUDIT): 0  Housing: Low Risk (03/30/2024)   Epic    Unable to Pay for Housing in the Last Year: No    Number of Times Moved in the Last Year: 0    Homeless in the Last Year: No  Utilities: Not At Risk (03/30/2024)   Epic    Threatened with loss of utilities: No  Health Literacy: Not on file    Physical Exam      Future Appointments  Date Time Provider Department Center  04/16/2024  8:30 AM [facility] PA/NP [facility] None  04/26/2024 11:00 AM Hope Almarie ORN, NP [facility] [address]  05/07/2024  3:15 PM Jennifer Jennifer SAILOR, DO [facility] None       Izetta Quivers, Paramedic [phone] [facility] Paramedic  04/15/24     [1]  Current Outpatient Medications:    albuterol  (VENTOLIN  HFA) 108 (90 Base) MCG/ACT inhaler, Inhale 2 puffs into the lungs daily as needed for wheezing or shortness of breath., Disp: , Rfl:    allopurinol  (ZYLOPRIM ) 100 MG tablet, Take 1 tablet (100 mg total) by mouth daily., Disp: 90 tablet, Rfl: 3   aspirin  EC 81 MG tablet, Take 1 tablet (81 mg total) by mouth daily. Swallow  whole., Disp: 30 tablet, Rfl: 1   atorvastatin  (LIPITOR ) 80 MG tablet, Take 1 tablet (80 mg total) by mouth every evening., Disp: 30 tablet, Rfl: 0   Biotin 10000 MCG TABS, Take 1 tablet by mouth daily., Disp: , Rfl:    calcitRIOL  (ROCALTROL ) 0.25 MCG capsule, Take 0.25 mcg by mouth daily., Disp: , Rfl:    doxazosin  (CARDURA ) 4 MG tablet, Take 2 tablets (8 mg total) by mouth daily., Disp: 60 tablet, Rfl: 5   DULoxetine  (CYMBALTA ) 60 MG capsule, Take 60 mg by mouth at bedtime., Disp: , Rfl:    hydrALAZINE  (APRESOLINE ) 100 MG tablet, Take 1 tablet (100 mg total) by mouth every 8 (eight) hours., Disp: 90 tablet, Rfl: 3   Insulin  Pen Needle (PEN NEEDLES) 31G X 5 MM MISC, Use as directed 3 (three) times daily., Disp: 100 each, Rfl: 0   isosorbide  mononitrate (IMDUR ) 60 MG 24 hr tablet, Take 1 tablet (60 mg total) by mouth daily., Disp: 30 tablet, Rfl: 3   ivabradine  (CORLANOR ) 7.5 MG TABS tablet, Take 1 tablet (7.5 mg total) by mouth 2 (two) times daily with a meal., Disp: 60 tablet, Rfl: 11   lansoprazole  (PREVACID ) 30 MG capsule, TAKE 1 CAPSULE (30 MG TOTAL) BY MOUTH 2 (TWO) TIMES DAILY BEFORE A MEAL., Disp: 60 capsule, Rfl: 2   levothyroxine  (SYNTHROID ) 150 MCG tablet, Take 150 mcg by mouth daily before breakfast., Disp: , Rfl:    [place] Sodium (TRYNGOLZA) 80 MG/0.8ML SOAJ, Inject 1 Units into the skin every 30 (thirty) days., Disp: , Rfl:    pregabalin  (LYRICA ) 300 MG capsule, Take 300 mg by mouth at bedtime., Disp: , Rfl:    promethazine  (PHENERGAN ) 25 MG tablet, Take 25 mg by mouth at bedtime., Disp: , Rfl:    torsemide  (DEMADEX ) 20 MG tablet, Take 3 tablets (60 mg total) by mouth daily., Disp: 90 tablet, Rfl: 5   amitriptyline  (ELAVIL ) 10 MG tablet, Take 10 mg by mouth. (Patient taking differently: Take 10 mg by mouth at bedtime.), Disp: , Rfl:    azelastine  (ASTELIN ) 0.1 % nasal spray, Place  2 sprays into both nostrils daily as needed for rhinitis or allergies., Disp: , Rfl:    BAQSIMI ONE  PACK 3 MG/DOSE POWD, Place 1 Dose into the nose once as needed (hypoglycemia)., Disp: , Rfl:    cephALEXin  (KEFLEX ) 500 MG capsule, Take 1 capsule (500 mg total) by mouth 2 (two) times daily for 5 days., Disp: 10 capsule, Rfl: 0   dicyclomine  (BENTYL ) 20 MG tablet, Take 1 tablet (20 mg total) by mouth 2 (two) times daily., Disp: 20 tablet, Rfl: 0   ergocalciferol  (VITAMIN D2) 1.25 MG (50000 UT) capsule, Take 1 capsule (50,000 Units total) by mouth every Sunday. (Patient not taking: Reported on 04/15/2024), Disp: 4 capsule, Rfl: 0   Glucose Blood (BLOOD GLUCOSE TEST STRIPS) STRP, 1 each by Does not apply route 3 (three) times daily. Use as directed to check blood sugar. May dispense any manufacturer covered by patient's insurance and fits patient's device., Disp: 100 strip, Rfl: 0   HUMALOG  KWIKPEN 100 UNIT/ML KwikPen, Inject 60 Units into the skin in the morning, at noon, and at bedtime. (Patient taking differently: Inject 60 Units into the skin in the morning, at noon, and at bedtime.), Disp: , Rfl:    HUMULIN  R U-500 KWIKPEN 500 UNIT/ML KwikPen, Inject 300 Units into the skin in the morning and at bedtime. (Patient taking differently: Inject 150 Units into the skin in the morning and at bedtime.), Disp: , Rfl:    Lancet Device MISC, 1 each by Does not apply route 3 (three) times daily. May dispense any manufacturer covered by patient's insurance., Disp: 1 each, Rfl: 0   Lancets MISC, 1 each by Does not apply route 3 (three) times daily. Use as directed to check blood sugar. May dispense any manufacturer covered by patient's insurance and fits patient's device., Disp: 100 each, Rfl: 0   levocetirizine (XYZAL ) 5 MG tablet, Take 5 mg by mouth at bedtime., Disp: , Rfl:    metolazone  (ZAROXOLYN ) 2.5 MG tablet, TAKE 1 TABLET TODAY 1/9, AND 1 TABLET TOMORROW 1/10-THEN ONLY AS DIRECTED BY [facility] CLINIC, Disp: 6 tablet, Rfl: 0   Safety Seal Miscellaneous MISC, Hormonic Hair Solution with minoxidil USP 7% and  finasteride USP 0.05% - apply to affected areas daily every morning., Disp: 30 mL, Rfl: 2   SUMAtriptan  (IMITREX ) 100 MG tablet, Take 100 mg by mouth daily as needed., Disp: , Rfl:    tretinoin  (RETIN-A ) 0.05 % cream, Apply 1 Application topically at bedtime., Disp: , Rfl:  [2]  Allergies Allergen Reactions   Icosapent  Ethyl (Epa Ethyl Ester) (Fish) Hives   Ketamine Other (See Comments)    In a vegetative state for 15 minutes, per pt   Maitake Itching and Other (See Comments)    Itchy throat   Morphine  Hives, Itching, Rash and Other (See Comments)    Sore throat, too   Mushroom Other (See Comments)    Pt has never had mushrooms but tested positive on allergy test   Penicillins Hives, Itching and Rash    Tolerates cephalosporins   Shellfish Allergy Other (See Comments)    Pt has never had shellfish, but tested positive on allergy test. Pt states contrast in CT is okay.   Fentanyl  Nausea And Vomiting   Prednisone Rash

## 2024-04-16 ENCOUNTER — Encounter (HOSPITAL_COMMUNITY): Payer: Self-pay

## 2024-04-16 ENCOUNTER — Other Ambulatory Visit (HOSPITAL_COMMUNITY): Payer: Self-pay

## 2024-04-16 ENCOUNTER — Ambulatory Visit (HOSPITAL_BASED_OUTPATIENT_CLINIC_OR_DEPARTMENT_OTHER): Admitting: Pulmonary Disease

## 2024-04-16 ENCOUNTER — Ambulatory Visit (HOSPITAL_COMMUNITY)
Admission: RE | Admit: 2024-04-16 | Discharge: 2024-04-16 | Disposition: A | Discharge status: Home / Self Care | Source: Ambulatory Visit | Attending: Physician Assistant | Admitting: Physician Assistant

## 2024-04-16 VITALS — BP 166/72 | HR 93 | Wt 156.8 lb

## 2024-04-16 DIAGNOSIS — Z79899 Other long term (current) drug therapy: Secondary | ICD-10-CM | POA: Insufficient documentation

## 2024-04-16 DIAGNOSIS — E781 Pure hyperglyceridemia: Secondary | ICD-10-CM | POA: Insufficient documentation

## 2024-04-16 DIAGNOSIS — Z7982 Long term (current) use of aspirin: Secondary | ICD-10-CM | POA: Diagnosis not present

## 2024-04-16 DIAGNOSIS — Z91148 Patient's other noncompliance with medication regimen for other reason: Secondary | ICD-10-CM | POA: Insufficient documentation

## 2024-04-16 DIAGNOSIS — K3184 Gastroparesis: Secondary | ICD-10-CM | POA: Insufficient documentation

## 2024-04-16 DIAGNOSIS — Z794 Long term (current) use of insulin: Secondary | ICD-10-CM | POA: Diagnosis not present

## 2024-04-16 DIAGNOSIS — I428 Other cardiomyopathies: Secondary | ICD-10-CM | POA: Insufficient documentation

## 2024-04-16 DIAGNOSIS — E1122 Type 2 diabetes mellitus with diabetic chronic kidney disease: Secondary | ICD-10-CM | POA: Diagnosis not present

## 2024-04-16 DIAGNOSIS — E1143 Type 2 diabetes mellitus with diabetic autonomic (poly)neuropathy: Secondary | ICD-10-CM | POA: Insufficient documentation

## 2024-04-16 DIAGNOSIS — K861 Other chronic pancreatitis: Secondary | ICD-10-CM | POA: Insufficient documentation

## 2024-04-16 DIAGNOSIS — Z5941 Food insecurity: Secondary | ICD-10-CM | POA: Insufficient documentation

## 2024-04-16 DIAGNOSIS — R1900 Intra-abdominal and pelvic swelling, mass and lump, unspecified site: Secondary | ICD-10-CM | POA: Diagnosis not present

## 2024-04-16 DIAGNOSIS — Z8719 Personal history of other diseases of the digestive system: Secondary | ICD-10-CM | POA: Diagnosis not present

## 2024-04-16 DIAGNOSIS — I5022 Chronic systolic (congestive) heart failure: Secondary | ICD-10-CM | POA: Diagnosis not present

## 2024-04-16 DIAGNOSIS — I5020 Unspecified systolic (congestive) heart failure: Secondary | ICD-10-CM | POA: Diagnosis not present

## 2024-04-16 DIAGNOSIS — I272 Pulmonary hypertension, unspecified: Secondary | ICD-10-CM | POA: Insufficient documentation

## 2024-04-16 DIAGNOSIS — I130 Hypertensive heart and chronic kidney disease with heart failure and stage 1 through stage 4 chronic kidney disease, or unspecified chronic kidney disease: Secondary | ICD-10-CM | POA: Diagnosis present

## 2024-04-16 DIAGNOSIS — I252 Old myocardial infarction: Secondary | ICD-10-CM | POA: Diagnosis not present

## 2024-04-16 DIAGNOSIS — Z59868 Other specified financial insecurity: Secondary | ICD-10-CM | POA: Diagnosis not present

## 2024-04-16 DIAGNOSIS — Z79890 Hormone replacement therapy (postmenopausal): Secondary | ICD-10-CM | POA: Insufficient documentation

## 2024-04-16 DIAGNOSIS — I2510 Atherosclerotic heart disease of native coronary artery without angina pectoris: Secondary | ICD-10-CM | POA: Insufficient documentation

## 2024-04-16 DIAGNOSIS — K589 Irritable bowel syndrome without diarrhea: Secondary | ICD-10-CM | POA: Insufficient documentation

## 2024-04-16 DIAGNOSIS — G4733 Obstructive sleep apnea (adult) (pediatric): Secondary | ICD-10-CM | POA: Diagnosis not present

## 2024-04-16 DIAGNOSIS — N184 Chronic kidney disease, stage 4 (severe): Secondary | ICD-10-CM | POA: Diagnosis not present

## 2024-04-16 DIAGNOSIS — I10 Essential (primary) hypertension: Secondary | ICD-10-CM | POA: Diagnosis not present

## 2024-04-16 DIAGNOSIS — Z85841 Personal history of malignant neoplasm of brain: Secondary | ICD-10-CM | POA: Insufficient documentation

## 2024-04-16 DIAGNOSIS — K760 Fatty (change of) liver, not elsewhere classified: Secondary | ICD-10-CM | POA: Insufficient documentation

## 2024-04-16 LAB — BASIC METABOLIC PANEL WITH GFR
Anion gap: 17 — ABNORMAL HIGH (ref 5–15)
BUN: 99 mg/dL — ABNORMAL HIGH (ref 6–20)
CO2: 23 mmol/L (ref 22–32)
Calcium: 9.7 mg/dL (ref 8.9–10.3)
Chloride: 101 mmol/L (ref 98–111)
Creatinine, Ser: 2.24 mg/dL — ABNORMAL HIGH (ref 0.44–1.00)
GFR, Estimated: 29 mL/min — ABNORMAL LOW
Glucose, Bld: 88 mg/dL (ref 70–99)
Potassium: 4 mmol/L (ref 3.5–5.1)
Sodium: 140 mmol/L (ref 135–145)

## 2024-04-16 LAB — PRO BRAIN NATRIURETIC PEPTIDE: Pro Brain Natriuretic Peptide: 275 pg/mL

## 2024-04-16 MED ORDER — NIFEDIPINE ER OSMOTIC RELEASE 30 MG PO TB24: 30.0000 mg | ORAL_TABLET | Freq: Every day | ORAL | 11 refills | Status: AC

## 2024-04-16 NOTE — Progress Notes (Signed)
 "   Advanced Heart Failure Clinic Note   PCP: Emilio Joesph DEL, PA-C Primary Cardiologist: [name], MD HF Cardiologist: Dr. [name]    HPI: Jennifer [name] is a 34 y.o. 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   Admitted 8/22 with vasculitis (no pancreatitis). Her TG were found to be 4030. Echo 11/28/20 EF 20-25% with moderate RV dysfunction and mild septal flattening. Started on DBA 2.5. cMRI c/w prior myocarditis vs sarcoid (see below). Mod pericardial effusion also noted. LVEF 29%. RV normal. She underwent R/LHC showing single vessel RCA occlusion, RA 1, LVEDP 17, Fick CO 5.6/CI 3.88. She was able to be weaned off inotropes and GDMT titrated.     Post hospital follow up she was hypervolemic, ReDs 43%. She followed up with Dr. Fredrica with [facility] 9/22 for a 2nd opinion. He placed LifeVest and switched beta blocker to Toprol  XL.   Echo 10/22: EF up to 45-50%, grade II DD, RV ok. LifeVest off.   Treated in ED 10/22 for pancreatitis, then admitted 11/22 for hyperglycemia.   Echo 12/22 EF 45%, RV normal.    Follow up 5/23, weight up 16 lbs, ReDs 27%. Arranged for RHC to better assess hemodynamics and see if abdominal symptoms related to right-sided HF due to high-output state or non-cardiac. This showed elevated filling pressures and normal cardiac ouput. Mild to moderate mixed PH with prominent v-waves in RA, suggestive of significant TR. Given IV lasix  and metolazone  2.5 added weekly to diuretic regimen.    Admitted 5/23 with AKI and hyperkalemia. Given IVF, insulin , calcium  gluconate and Lokelma . Echo showed EF 45-50%, RV normal, mild-mod MR, RVSP 30. Only mild TR. Restarted on torsemide  40 bid (lower dose) and Entresto  49/51 bid. Arranged paramedicine, discharged home 143 lbs.   Seen 7/23 at [facility] for 2nd opinion, felt to be congested and recommended increasing torsemide  to 60 mg bid and instructed f/u in 3 months.   Admitted 9/23 for volume overload in setting of RV failure and AKI. Required milrinone . Underwent Cardiomems implant. RHC showed RA 15, PA 37/23 (28), PCWP 16, Fick CO/CI 4.2/2.6, Thermo CO/CI 3.7/2.3, PVR 4.3, PA sat 51%, 54%, PAPi 0.93  Previously being followed by Dr. Kimble at [facility]. Had renal biopsy 10/23 which showed DM and HTN nephropathy. They discussed starting HD soon. Now being followed by [facility].   Echo 10/04/22 EF 55% RV ok. Mild AI   Continued to have multiple hospitalizations and ED visits for hyperglycemia, hyperTG (>5,000), uncontrolled HTN, and abdominal pain.   Admitted 9/25 for a/c CHF in setting of poor med compliance. Echo EF 45-50%, RV mildly reduced. She was diuresed w/ IV Lasix  gtt and metolazone , sCr bumped to >3.0. Underwent RHC which showed RA 6, PA 46/23 (mean 31), PCWP 13, FICK CI 1.8, TPG 18, PVR 6.4 WU, PAPi 3.8, severely elevated SVR 2857. Significant respiratory variation, but cardiomems correlates well with RHC numbers. Of note it was recommended, when evaluating cardiomems, would look at the PA tracings and use the end expiratory values when looking at [facility]. GDMT titrated and she was re-enrolled in paramedicine. Discharged home, weight 139 lbs.  Admitted 12/28/23, sent from AHF clinic, for a/c dHF. She was diuresed with IV lasix  and discharged home, weight 145 lbs.  Readmitted 11/25 and 01/26 with acute on chronic CHF. Prior to most recent admission she had gained  10-15 lb. Diuresed with IV lasix  then transitioned to po torsemide  60 mg daily (prior home dose).   Here today for CHF follow-up. [name] accompanied by Paramedic, Jennifer [name]. Feeling better than she did last week. Less bloating. Was not able to weigh over the weekend as she was out of town for a wedding. Clinic weight down 2 lb from last week. PADP 25 (goal 24), trending down from 28 on  01/16. No shortness of breath, orthopnea, PND or lower extremity edema. Trying to watch fluid intake.  BP always elevated despite compliance with medicines.   ROS: All systems reviewed and negative except as per HPI.   Past Medical History:  Diagnosis Date   Afib (HCC) 05/12/2021   Brain tumor (HCC) 03/29/1995   astrocytoma   CHF (congestive heart failure) (HCC)    Cholesterosis    CKD (chronic kidney disease) stage 4, GFR 15-29 ml/min (HCC) 05/13/2021   DM (diabetes mellitus) (HCC) 10/10/2018   Fatty liver    Gastroparesis due to DM (HCC) 05/12/2021   HTN (hypertension) 10/10/2018   Hypothyroidism 10/10/2018   Lipoprotein deficiency    Lung disease    longevity long term   Pancreatitis    Polycystic ovary syndrome    Small intestinal bacterial overgrowth (SIBO) 08/26/2021   Type 2 MI (myocardial infarction) (HCC) 01/19/2023   Current Outpatient Medications  Medication Sig Dispense Refill   albuterol  (VENTOLIN  HFA) 108 (90 Base) MCG/ACT inhaler Inhale 2 puffs into the lungs daily as needed for wheezing or shortness of breath.     allopurinol  (ZYLOPRIM ) 100 MG tablet Take 1 tablet (100 mg total) by mouth daily. 90 tablet 3   amitriptyline  (ELAVIL ) 10 MG tablet Take 10 mg by mouth. (Patient taking differently: Take 10 mg by mouth at bedtime.)     aspirin  EC 81 MG tablet Take 1 tablet (81 mg total) by mouth daily. Swallow whole. 30 tablet 1   atorvastatin  (LIPITOR ) 80 MG tablet Take 1 tablet (80 mg total) by mouth every evening. 30 tablet 0   azelastine  (ASTELIN ) 0.1 % nasal spray Place 2 sprays into both nostrils daily as needed for rhinitis or allergies.     BAQSIMI ONE PACK 3 MG/DOSE POWD Place 1 Dose into the nose once as needed (hypoglycemia).     Biotin 89999 MCG TABS Take 1 tablet by mouth daily.     calcitRIOL  (ROCALTROL ) 0.25 MCG capsule Take 0.25 mcg by mouth daily.     cephALEXin  (KEFLEX ) 500 MG capsule Take 1 capsule (500 mg total) by mouth 2 (two) times daily for 5  days. 10 capsule 0   dicyclomine  (BENTYL ) 20 MG tablet Take 1 tablet (20 mg total) by mouth 2 (two) times daily. 20 tablet 0   doxazosin  (CARDURA ) 4 MG tablet Take 2 tablets (8 mg total) by mouth daily. 60 tablet 5   DULoxetine  (CYMBALTA ) 60 MG capsule Take 60 mg by mouth at bedtime.     ergocalciferol  (VITAMIN D2) 1.25 MG (50000 UT) capsule Take 1 capsule (50,000 Units total) by mouth every Sunday. 4 capsule 0   Glucose Blood (BLOOD GLUCOSE TEST STRIPS) STRP 1 each by Does not apply route 3 (three) times daily. Use as directed to check blood sugar. May dispense any manufacturer covered by patient's insurance and fits patient's device. 100 strip 0   HUMALOG  KWIKPEN 100 UNIT/ML KwikPen Inject 60 Units into the skin in the morning, at noon, and at bedtime. (Patient taking differently: Inject 60 Units into the skin in  the morning, at noon, and at bedtime.)     HUMULIN  R U-500 KWIKPEN 500 UNIT/ML KwikPen Inject 300 Units into the skin in the morning and at bedtime. (Patient taking differently: Inject 150 Units into the skin in the morning and at bedtime.)     hydrALAZINE  (APRESOLINE ) 100 MG tablet Take 1 tablet (100 mg total) by mouth every 8 (eight) hours. 90 tablet 3   Insulin  Pen Needle (PEN NEEDLES) 31G X 5 MM MISC Use as directed 3 (three) times daily. 100 each 0   isosorbide  mononitrate (IMDUR ) 60 MG 24 hr tablet Take 1 tablet (60 mg total) by mouth daily. 30 tablet 3   ivabradine  (CORLANOR ) 7.5 MG TABS tablet Take 1 tablet (7.5 mg total) by mouth 2 (two) times daily with a meal. 60 tablet 11   Lancet Device MISC 1 each by Does not apply route 3 (three) times daily. May dispense any manufacturer covered by patient's insurance. 1 each 0   Lancets MISC 1 each by Does not apply route 3 (three) times daily. Use as directed to check blood sugar. May dispense any manufacturer covered by patient's insurance and fits patient's device. 100 each 0   lansoprazole  (PREVACID ) 30 MG capsule TAKE 1 CAPSULE (30 MG  TOTAL) BY MOUTH 2 (TWO) TIMES DAILY BEFORE A MEAL. 60 capsule 2   levocetirizine (XYZAL ) 5 MG tablet Take 5 mg by mouth at bedtime.     levothyroxine  (SYNTHROID ) 150 MCG tablet Take 150 mcg by mouth daily before breakfast.     metolazone  (ZAROXOLYN ) 2.5 MG tablet TAKE 1 TABLET TODAY 1/9, AND 1 TABLET TOMORROW 1/10-THEN ONLY AS DIRECTED BY HF CLINIC 6 tablet 0   [place] Sodium (TRYNGOLZA) 80 MG/0.8ML SOAJ Inject 1 Units into the skin every 30 (thirty) days.     pregabalin  (LYRICA ) 300 MG capsule Take 300 mg by mouth at bedtime.     promethazine  (PHENERGAN ) 25 MG tablet Take 25 mg by mouth at bedtime.     Safety Seal Miscellaneous MISC Hormonic Hair Solution with minoxidil USP 7% and finasteride USP 0.05% - apply to affected areas daily every morning. 30 mL 2   SUMAtriptan  (IMITREX ) 100 MG tablet Take 100 mg by mouth daily as needed.     torsemide  (DEMADEX ) 20 MG tablet Take 3 tablets (60 mg total) by mouth daily. 90 tablet 5   tretinoin  (RETIN-A ) 0.05 % cream Apply 1 Application topically at bedtime.     No current facility-administered medications for this encounter.   Allergies  Allergen Reactions   Icosapent  Ethyl (Epa Ethyl Ester) (Fish) Hives   Ketamine Other (See Comments)    In a vegetative state for 15 minutes, per pt   Maitake Itching and Other (See Comments)    Itchy throat   Morphine  Hives, Itching, Rash and Other (See Comments)    Sore throat, too   Mushroom Other (See Comments)    Pt has never had mushrooms but tested positive on allergy test   Penicillins Hives, Itching and Rash    Tolerates cephalosporins   Shellfish Allergy Other (See Comments)    Pt has never had shellfish, but tested positive on allergy test. Pt states contrast in CT is okay.   Fentanyl  Nausea And Vomiting   Prednisone Rash   Social History   Socioeconomic History   Marital status: Divorced    Spouse name: Not on file   Number of children: 0   Years of education: Not on file   Highest  education  level: Not on file  Occupational History   Occupation: [employer]   Occupation: N/A  Tobacco Use   Smoking status: Never   Smokeless tobacco: Never  Vaping Use   Vaping status: Never Used  Substance and Sexual Activity   Alcohol  use: Never   Drug use: Never   Sexual activity: Yes  Other Topics Concern   Not on file  Social History Narrative   Not on file   Social Drivers of Health   Tobacco Use: Low Risk (04/16/2024)   Patient History    Smoking Tobacco Use: Never    Smokeless Tobacco Use: Never    Passive Exposure: Not on file  Financial Resource Strain: Medium Risk (01/22/2024)   Received from [facility]   Overall Financial Resource Strain (CARDIA)    How hard is it for you to pay for the very basics like food, housing, medical care, and heating?: Somewhat hard  Food Insecurity: No Food Insecurity (03/30/2024)   Epic    Worried About Programme Researcher, Broadcasting/film/video in the Last Year: Never true    Ran Out of Food in the Last Year: Never true  Recent Concern: Food Insecurity - Food Insecurity Present (01/22/2024)   Received from [facility]   Epic    Within the past 12 months, you worried that your food would run out before you got the money to buy more.: Sometimes true    Within the past 12 months, the food you bought just didn't last and you didn't have money to get more.: Sometimes true  Transportation Needs: No Transportation Needs (03/30/2024)   Epic    Lack of Transportation (Medical): No    Lack of Transportation (Non-Medical): No  Recent Concern: Transportation Needs - Unmet Transportation Needs (01/22/2024)   Received from [facility]    Lack of Transportation (Medical): Patient declined    In the past 12 months, has lack of transportation kept you from meetings, work, or from getting things needed for daily living?: Yes  Physical Activity: Inactive (01/22/2024)   Received from [facility]   Exercise Vital Sign    On average, how many days per  week do you engage in moderate to strenuous exercise (like a brisk walk)?: 0 days    Minutes of Exercise per Session: Not on file  Stress: No Stress Concern Present (01/22/2024)   Received from [facility] - Occupational Stress Questionnaire    Do you feel stress - tense, restless, nervous, or anxious, or unable to sleep at night because your mind is troubled all the time - these days?: Not at all  Social Connections: Socially Integrated (01/22/2024)   Received from [facility]   Social Network    How would you rate your social network (family, work, friends)?: Good participation with social networks  Intimate Partner Violence: Not At Risk (03/30/2024)   Epic    Fear of Current or Ex-Partner: No    Emotionally Abused: No    Physically Abused: No    Sexually Abused: No  Depression (PHQ2-9): Low Risk (12/10/2021)   Depression (PHQ2-9)    PHQ-2 Score: 0  Alcohol  Screen: Low Risk (12/10/2021)   Alcohol  Screen    Last Alcohol  Screening Score (AUDIT): 0  Housing: Low Risk (03/30/2024)   Epic    Unable to Pay for Housing in the Last Year: No    Number of Times Moved in the Last Year: 0    Homeless  in the Last Year: No  Utilities: Not At Risk (03/30/2024)   Epic    Threatened with loss of utilities: No  Health Literacy: Not on file   Family History  Problem Relation Age of Onset   Diabetes Mother    Hypertension Mother    Hyperlipidemia Mother    Thyroid  disease Mother    Hypertension Father    Diabetes Father    Pancreatic cancer Paternal Aunt    Pancreatic cancer Paternal Uncle    Colon cancer Neg Hx    Esophageal cancer Neg Hx    Stomach cancer Neg Hx    Wt Readings from Last 3 Encounters:  04/16/24 71.1 kg (156 lb 12.8 oz)  04/15/24 69.4 kg (153 lb)  04/08/24 71.7 kg (158 lb)   BP (!) 166/72   Pulse 93   Wt 71.1 kg (156 lb 12.8 oz)   SpO2 98%   BMI 33.93 kg/m   Physical Exam  General:  Ambulated into clinic. No distress.   Cor: Jvp difficult, thick neck. Regular rate & rhythm. No murmurs. Lungs: clear Abdomen: soft, nontender, nondistended. Extremities: no edema Neuro: alert & orientedx3. Affect pleasant    Cardiomems (personally reviewed): PADP 25 (goal 24), see discussion above  ASSESSMENT & PLAN: Nonischemic cardiomyopathy, recovered EF, previously with HFrEF (EF 29%) - prev reduced EF to 29%.  - Echo 9/25: EF 45-50%, RV mildly reduced - RHC 9/25: RA 6, PA 46/23 (mean 31), PCWP 13, FICK CI 1.8, TPG 18, PVR 6.4 WU, PAPi 3.8, severely elevated SVR 2857 - Improved recently, NYHA II. Volume difficult on exam. PADP slightly above goal, 25 today (goal 24). Trending down from 28 last weekend (was out of town for a wedding). Did not adjust diuretics today. Continue Torsemide  60 mg daily. Continue daily CardioMems readings. Call clinic with weight gain > 5 lb.  - GDMT limited by CKD - Continue hydralazine  100 mg tid + Imdur  60 mg daily - Continue ivabradine  7.5 mg bid. - No beta blocker with low CI on RHC - BMET/BNP - Appreciate paramedicine  2. CAD - Single vessel RCA occlusion (small co-dominant vessel) on cardiac cath 09/08. - No s/s angina - Continue medical management. On aspirin  + statin  3.  CKD IV - Scr baseline variable with frequent AKIs  - Previously followed by Dr. Kimble (Nephrology at [facility]) - Had renal biopsy (10/23) and showed DM and HTN nephropathy.  - Now followed by [facility]. Reports she will be completing HD teaching soon. - Baseline SCr now ~ 2.8-3 - Labs today   4. Pulmonary hypertension - Mild to moderate on RHC 5/23. PVR 3.7 WU. - RHC 9/23: RA 15, PA 37/23 (28), PCWP 16,  Thermo CO/CI 3.7/2.3, PVR 4.3, PA sat 51%, 54%, PAPi 0.93  - RHC 9/25 RA 6, PA 46/23 (mean 31), PCWP 13, FICK CI 1.8, TPG 18, PVR 6.4 WU, PAPi 3.8, severely elevated SVR 2857 - Echo 9/25, RV mildly reduced  - V/Q negative - Off sildenafil  d/t dizziness and low BP - No change   5. TR - Prominent v-waves in RA  tracing on RHC 5/23. - prev stable mild/mod - Trivial on echo 9/23  - Mild on echo 9/25  6. HTN - BP above goal - Continue 8 mg cardura  daily, add nifedipine  30 - Other meds as above   7. OSA - Unable to tolerate CPAP mask - Follows with [facility]  8. Severe hyperTG - Due to LPL deficiency w/ recent pancreatitis. -  Follows in [facility] (Dr. Mona)  - Continue atorvastatin , off fenofibrate  d/t GFR  9. Type 3c DM - 2/2 chronic pancreatitis  - Follows with [facility] - On insulin   10. Chronic abdominal swelling/bloating - Seen at [facility] (11/20) for IBS, per chart review. - Had penumatosis intestinalis. She was started on pancreatic enzyme replacement. - Has DM gastroparesis - Suspect primarily fatty liver - no change  11. Poor Med Compliance/SDOH - Doing better with paramedicine back on board - Appreciate their assistance   Follow-up: 3 weeks with APP, continue to follow closely for now given history of frequent admissions.   Shealeigh Dunstan N, PA-C  8:28 AM  "

## 2024-04-16 NOTE — Progress Notes (Signed)
 Paramedicine Encounter   Patient ID: Jennifer [name] , female,   DOB: April 03, 1990,33 y.o.,  MRN: [id number]   Met patient in clinic today with provider.  Weight @ clinic-156 B/P-166/72 P-93 [id number]  Med changes-nifedipine  30mg  added  Pts b/p continue to be elevated, med being added as noted.  Sent message to pharm to go ahead and send out once they receive it.   Will f/u next week.   Izetta Quivers, EMT-Paramedic [phone] 04/16/2024

## 2024-04-16 NOTE — Patient Instructions (Signed)
 Medication Changes:  START Nifedipine  XL 30 mg Daily  Lab Work:  Labs done today, your results will be available in MyChart, we will contact you for abnormal readings.   Special Instructions // Education:  Do the following things EVERYDAY: Weigh yourself in the morning before breakfast. Write it down and keep it in a log. Take your medicines as prescribed Eat low salt foods--Limit salt (sodium) to 2000 mg per day.  Stay as active as you can everyday Limit all fluids for the day to less than 2 liters   Follow-Up in: 3 weeks   At the Advanced [facility] Failure Clinic, you and your health needs are our priority. We have a designated team specialized in the treatment of Heart Failure. This Care Team includes your primary Heart Failure Specialized Cardiologist (physician), Advanced Practice Providers (APPs- Physician Assistants and Nurse Practitioners), and Pharmacist who all work together to provide you with the care you need, when you need it.   You may see any of the following providers on your designated Care Team at your next follow up:  Dr. Toribio Fuel Dr. Ezra Shuck Dr. Odis Brownie Greig Mosses, NP Caffie Shed, [place] [facility] [place], [place] Beckey Coe, NP Jordan Lee, NP Tinnie Redman, PharmD   Please be sure to bring in all your medications bottles to every appointment.   Need to Contact Us :  If you have any questions or concerns before your next appointment please send us  a message through [place] or call our office at [phone].    TO LEAVE A MESSAGE FOR THE NURSE SELECT OPTION 2, PLEASE LEAVE A MESSAGE INCLUDING: YOUR NAME DATE OF BIRTH CALL BACK NUMBER REASON FOR CALL**this is important as we prioritize the call backs  YOU WILL RECEIVE A CALL BACK THE SAME DAY AS LONG AS YOU CALL BEFORE 4:00 PM

## 2024-04-17 ENCOUNTER — Ambulatory Visit (HOSPITAL_COMMUNITY): Payer: Self-pay | Admitting: Physician Assistant

## 2024-04-22 ENCOUNTER — Ambulatory Visit (HOSPITAL_COMMUNITY)

## 2024-04-22 ENCOUNTER — Encounter (HOSPITAL_COMMUNITY): Payer: Self-pay

## 2024-04-22 ENCOUNTER — Telehealth: Payer: Self-pay | Admitting: Family

## 2024-04-22 NOTE — Telephone Encounter (Signed)
" °  Cardiomems Remote Monitoring  S/P Cardiomems Implant 12/06/21  PAD Goal: 24 Most recent reading: 30 indicating fluid build-up  Recommended changes: Spoke with patient regarding symptoms and elevated cardiomems readings. Patient says that her weight has been stable and no change in her shortness of breath. She does note abdominal distention. Advised patient to take metolazone  2.5mg  daily for the next 2 days. Encouraged her to continue her daily transmissions. Patient verbalized understanding.   I continue to review and analyze the patients PA pressures weekly (and more often as needed) to bring PA pressures within the optimal range.      [name] DELENA Class FNP-C 04/22/24        "

## 2024-04-24 ENCOUNTER — Other Ambulatory Visit (HOSPITAL_COMMUNITY): Payer: Self-pay

## 2024-04-24 NOTE — Progress Notes (Unsigned)
 Paramedicine Encounter    Patient ID: Jennifer [name], female    DOB: [birth year], 34 y.o.   MRN: [id number]   Complaints-palpitations at random times, tired  Edema-feels bloated  Compliance with meds-yes   Pill box filled-yes-pt filled it Monday but I topped it back off and checked for any errors.  If so, by whom-paramedic   Refills needed-atorvastatin , corlanor , torsemide  -ordered those      Pt texted me earlier today that her weight is up 3lbs from yesterday 153>>156.  On Monday [name] reached out to her b/c her cardiomems was elevated and she was instructed to take metolazone  X 2 days. Pt reports she did that, good output for Monday and Tuesday but today her weight is back up. [name] advised her cardiomems was back down to goal. No more changes presently. Will continue to monitor. Very little edema to her legs. She does feel like her abd is bloated.  She reports palpitations at times. Her HR is elevated she reports drinking coffee with caffeine .   No c/p, no increased sob, no dizziness.   [name] going to start seeing a hematologist regarding her fatty liver.   CBGs- high last night due to dunkin doughnuts coffee But then dropped by morning time.   Meds verified and pill box refilled   BP 120/72   Pulse (!) 108   Resp 16   Wt 156 lb (70.8 kg)   SpO2 98%   BMI 33.76 kg/m  Weight yesterday-153  Last visit weight-156 @ clinic   Patient Care Team: [name] as PCP - General (Physician Assistant) Tommas Pears, MD as PCP - Endocrinology (Endocrinology) Ladona Heinz, MD as PCP - Cardiology (Cardiology)  Patient Active Problem List   Diagnosis Date Noted   Drug-seeking behavior 03/30/2024   Pure hypercholesterolemia 02/24/2024   CKD (chronic kidney disease) stage 4, GFR 15-29 ml/min (HCC) - baseline scr 2.8-3.0 02/23/2024   Hyperglycemia due to diabetes mellitus (HCC) 02/23/2024   NICM (nonischemic cardiomyopathy) (HCC) 02/23/2024   Skin infection  01/02/2024   Controlled type 2 diabetes mellitus with hyperglycemia (HCC) 08/31/2023   Sleep apnea 08/30/2023   Chronic pancreatitis (HCC) 08/15/2023   Peripheral neuropathy 04/10/2023   Coronary artery disease due to lipid rich plaque 01/18/2023   Elevated liver enzymes 01/02/2023   Bladder wall thickening 01/02/2023   Hyperglycemia in setting of T2DM 09/19/2022   ASCUS of cervix with negative high risk HPV 05/31/2022   Pulmonary hypertension (HCC) 02/06/2022   NASH (nonalcoholic steatohepatitis) 02/06/2022   Obesity, Class I, BMI 30-34.9 02/06/2022   Acute on chronic combined systolic and diastolic CHF (congestive heart failure) (HCC) 12/02/2021   Pancytopenia (HCC) 12/02/2021   Amenorrhea 12/02/2021   Hepatic steatosis 09/27/2021   Hx of insulin  dependent diabetes mellitus 08/14/2021   Gastroparesis due to DM (HCC) 05/12/2021   History of astrocytoma of brain 02/09/2021   Type 2 diabetes mellitus with hyperglycemia, with long-term current use of insulin  (HCC) 01/25/2021   Elevated transaminase level    Orthostatic hypotension 11/28/2020   Acute combined systolic and diastolic heart failure (HCC)    Nonischemic cardiomyopathy (HCC)    Chronic systolic congestive heart failure (HCC) 11/23/2020   Primary hypertension 11/23/2020   Familial hypertriglyceridemia 11/23/2020   Prolonged QT interval 11/23/2020   Polycystic ovary syndrome 01/30/2020   Moderate aortic insufficiency 08/16/2019   Moderate mitral regurgitation 08/16/2019   Lipoprotein lipase deficiency, familial 06/19/2019   Microalbuminuria due to type 1 diabetes mellitus (HCC) 06/19/2019  Type 2 diabetes mellitus with hyperlipidemia (HCC) 10/10/2018   Hypothyroidism 10/10/2018   Current Medications[1] Allergies[2]    Social History   Socioeconomic History   Marital status: Divorced    Spouse name: Not on file   Number of children: 0   Years of education: Not on file   Highest education level: Not on file   Occupational History   Occupation: Dance Movement Psychotherapist   Occupation: N/A  Tobacco Use   Smoking status: Never   Smokeless tobacco: Never  Vaping Use   Vaping status: Never Used  Substance and Sexual Activity   Alcohol  use: Never   Drug use: Never   Sexual activity: Yes  Other Topics Concern   Not on file  Social History Narrative   Not on file   Social Drivers of Health   Tobacco Use: Low Risk (04/19/2024)   Received from [facility]   Patient History    Smoking Tobacco Use: Never    Smokeless Tobacco Use: Never    Passive Exposure: Never  Financial Resource Strain: Medium Risk (04/19/2024)   Received from [facility]   Overall Financial Resource Strain (CARDIA)    How hard is it for you to pay for the very basics like food, housing, medical care, and heating?: Somewhat hard  Food Insecurity: No Food Insecurity (04/19/2024)   Received from [facility]   Epic    Within the past 12 months, you worried that your food would run out before you got the money to buy more.: Never true    Within the past 12 months, the food you bought just didn't last and you didn't have money to get more.: Never true  Recent Concern: Food Insecurity - Food Insecurity Present (01/22/2024)   Received from [facility]   Epic    Within the past 12 months, you worried that your food would run out before you got the money to buy more.: Sometimes true    Within the past 12 months, the food you bought just didn't last and you didn't have money to get more.: Sometimes true  Transportation Needs: No Transportation Needs (04/19/2024)   Received from [facility]   Epic    In the past 12 months, has lack of transportation kept you from medical appointments or from getting medications?: No    In the past 12 months, has lack of transportation kept you from meetings, work, or from getting things needed for daily living?: No  Recent Concern: Transportation Needs - Unmet Transportation Needs (01/22/2024)    Received from [facility]   Epic    Lack of Transportation (Medical): Patient declined    In the past 12 months, has lack of transportation kept you from meetings, work, or from getting things needed for daily living?: Yes  Physical Activity: Inactive (01/22/2024)   Received from [facility]   Exercise Vital Sign    On average, how many days per week do you engage in moderate to strenuous exercise (like a brisk walk)?: 0 days    Minutes of Exercise per Session: Not on file  Stress: No Stress Concern Present (01/22/2024)   Received from [facility] - Occupational Stress Questionnaire    Do you feel stress - tense, restless, nervous, or anxious, or unable to sleep at night because your mind is troubled all the time - these days?: Not at all  Social Connections: Socially Integrated (01/22/2024)   Received from [facility]   Social  Network    How would you rate your social network (family, work, friends)?: Good participation with social networks  Intimate Partner Violence: Not At Risk (03/30/2024)   Epic    Fear of Current or Ex-Partner: No    Emotionally Abused: No    Physically Abused: No    Sexually Abused: No  Depression (PHQ2-9): Low Risk (12/10/2021)   Depression (PHQ2-9)    PHQ-2 Score: 0  Alcohol  Screen: Low Risk (12/10/2021)   Alcohol  Screen    Last Alcohol  Screening Score (AUDIT): 0  Housing: Low Risk (04/19/2024)   Received from [facility]    In the last 12 months, was there a time when you were not able to pay the mortgage or rent on time?: No    In the past 12 months, how many times have you moved where you were living?: 0    At any time in the past 12 months, were you homeless or living in a shelter (including now)?: No  Utilities: Not At Risk (04/19/2024)   Received from [facility]   Epic    In the past 12 months has the electric, gas, oil, or water  company threatened to shut off services in your home?: No   Health Literacy: Not on file    Physical Exam      Future Appointments  Date Time Provider Department Center  04/26/2024 11:00 AM Hope Almarie ORN, NP [facility] [address]  05/07/2024  3:15 PM Alm Jennifer SAILOR, DO [facility] None  05/08/2024  1:30 PM [facility] PA/NP [facility] None  07/05/2024  8:40 AM [name], Vinie BROCKS, MD [facility]       Izetta Quivers, Paramedic [phone] [facility] Paramedic  04/25/24     [1]  Current Outpatient Medications:    albuterol  (VENTOLIN  HFA) 108 (90 Base) MCG/ACT inhaler, Inhale 2 puffs into the lungs daily as needed for wheezing or shortness of breath., Disp: , Rfl:    allopurinol  (ZYLOPRIM ) 100 MG tablet, Take 1 tablet (100 mg total) by mouth daily., Disp: 90 tablet, Rfl: 3   aspirin  EC 81 MG tablet, Take 1 tablet (81 mg total) by mouth daily. Swallow whole., Disp: 30 tablet, Rfl: 1   atorvastatin  (LIPITOR ) 80 MG tablet, Take 1 tablet (80 mg total) by mouth every evening., Disp: 30 tablet, Rfl: 0   Biotin 10000 MCG TABS, Take 1 tablet by mouth daily., Disp: , Rfl:    calcitRIOL  (ROCALTROL ) 0.25 MCG capsule, Take 0.25 mcg by mouth daily., Disp: , Rfl:    doxazosin  (CARDURA ) 4 MG tablet, Take 2 tablets (8 mg total) by mouth daily., Disp: 60 tablet, Rfl: 5   DULoxetine  (CYMBALTA ) 60 MG capsule, Take 60 mg by mouth at bedtime., Disp: , Rfl:    hydrALAZINE  (APRESOLINE ) 100 MG tablet, Take 1 tablet (100 mg total) by mouth every 8 (eight) hours., Disp: 90 tablet, Rfl: 3   isosorbide  mononitrate (IMDUR ) 60 MG 24 hr tablet, Take 1 tablet (60 mg total) by mouth daily., Disp: 30 tablet, Rfl: 3   ivabradine  (CORLANOR ) 7.5 MG TABS tablet, Take 1 tablet (7.5 mg total) by mouth 2 (two) times daily with a meal., Disp: 60 tablet, Rfl: 11   lansoprazole  (PREVACID ) 30 MG capsule, TAKE 1 CAPSULE (30 MG TOTAL) BY MOUTH 2 (TWO) TIMES DAILY BEFORE A MEAL., Disp: 60 capsule, Rfl: 2   levocetirizine (XYZAL ) 5 MG tablet, Take 5 mg by mouth at bedtime., Disp: ,  Rfl:    levothyroxine  (SYNTHROID ) 150  MCG tablet, Take 150 mcg by mouth daily before breakfast., Disp: , Rfl:    NIFEdipine  (PROCARDIA -XL/NIFEDICAL-XL) 30 MG 24 hr tablet, Take 1 tablet (30 mg total) by mouth daily., Disp: 30 tablet, Rfl: 11   pregabalin  (LYRICA ) 300 MG capsule, Take 300 mg by mouth at bedtime., Disp: , Rfl:    promethazine  (PHENERGAN ) 25 MG tablet, Take 25 mg by mouth at bedtime., Disp: , Rfl:    torsemide  (DEMADEX ) 20 MG tablet, Take 3 tablets (60 mg total) by mouth daily., Disp: 90 tablet, Rfl: 5   amitriptyline  (ELAVIL ) 10 MG tablet, Take 10 mg by mouth. (Patient taking differently: Take 10 mg by mouth at bedtime.), Disp: , Rfl:    azelastine  (ASTELIN ) 0.1 % nasal spray, Place 2 sprays into both nostrils daily as needed for rhinitis or allergies., Disp: , Rfl:    BAQSIMI ONE PACK 3 MG/DOSE POWD, Place 1 Dose into the nose once as needed (hypoglycemia)., Disp: , Rfl:    cephALEXin  (KEFLEX ) 500 MG capsule, Take 1 capsule (500 mg total) by mouth 2 (two) times daily for 5 days., Disp: 10 capsule, Rfl: 0   dicyclomine  (BENTYL ) 20 MG tablet, Take 1 tablet (20 mg total) by mouth 2 (two) times daily., Disp: 20 tablet, Rfl: 0   ergocalciferol  (VITAMIN D2) 1.25 MG (50000 UT) capsule, Take 1 capsule (50,000 Units total) by mouth every Sunday. (Patient not taking: Reported on 04/24/2024), Disp: 4 capsule, Rfl: 0   Glucose Blood (BLOOD GLUCOSE TEST STRIPS) STRP, 1 each by Does not apply route 3 (three) times daily. Use as directed to check blood sugar. May dispense any manufacturer covered by patient's insurance and fits patient's device., Disp: 100 strip, Rfl: 0   HUMALOG  KWIKPEN 100 UNIT/ML KwikPen, Inject 60 Units into the skin in the morning, at noon, and at bedtime. (Patient taking differently: Inject 60 Units into the skin in the morning, at noon, and at bedtime.), Disp: , Rfl:    HUMULIN  R U-500 KWIKPEN 500 UNIT/ML KwikPen, Inject 300 Units into the skin in the morning and at bedtime.  (Patient taking differently: Inject 150 Units into the skin in the morning and at bedtime.), Disp: , Rfl:    Insulin  Pen Needle (PEN NEEDLES) 31G X 5 MM MISC, Use as directed 3 (three) times daily., Disp: 100 each, Rfl: 0   Lancet Device MISC, 1 each by Does not apply route 3 (three) times daily. May dispense any manufacturer covered by patient's insurance., Disp: 1 each, Rfl: 0   Lancets MISC, 1 each by Does not apply route 3 (three) times daily. Use as directed to check blood sugar. May dispense any manufacturer covered by patient's insurance and fits patient's device., Disp: 100 each, Rfl: 0   metolazone  (ZAROXOLYN ) 2.5 MG tablet, TAKE 1 TABLET TODAY 1/9, AND 1 TABLET TOMORROW 1/10-THEN ONLY AS DIRECTED BY HF CLINIC, Disp: 6 tablet, Rfl: 0   O[place]en Sodium (TRYNGOLZA) 80 MG/0.8ML SOAJ, Inject 1 Units into the skin every 30 (thirty) days., Disp: , Rfl:    Safety Seal Miscellaneous MISC, Hormonic Hair Solution with minoxidil USP 7% and finasteride USP 0.05% - apply to affected areas daily every morning., Disp: 30 mL, Rfl: 2   SUMAtriptan  (IMITREX ) 100 MG tablet, Take 100 mg by mouth daily as needed., Disp: , Rfl:    tretinoin  (RETIN-A ) 0.05 % cream, Apply 1 Application topically at bedtime., Disp: , Rfl:  [2]  Allergies Allergen Reactions   Icosapent  Ethyl (Epa Ethyl Ester) (Fish) Hives  Ketamine Other (See Comments)    In a vegetative state for 15 minutes, per pt   Maitake Itching and Other (See Comments)    Itchy throat   Morphine  Hives, Itching, Rash and Other (See Comments)    Sore throat, too   Mushroom Other (See Comments)    Pt has never had mushrooms but tested positive on allergy test   Penicillins Hives, Itching and Rash    Tolerates cephalosporins   Shellfish Allergy Other (See Comments)    Pt has never had shellfish, but tested positive on allergy test. Pt states contrast in CT is okay.   Fentanyl  Nausea And Vomiting   Prednisone Rash

## 2024-04-25 ENCOUNTER — Telehealth (HOSPITAL_COMMUNITY): Payer: Self-pay | Admitting: Licensed Clinical Social Worker

## 2024-04-25 ENCOUNTER — Telehealth: Payer: Self-pay

## 2024-04-25 NOTE — Telephone Encounter (Signed)
 Sure if you would have someone let me know when the pt is here I can come and schedule them.

## 2024-04-25 NOTE — Telephone Encounter (Signed)
 Thanks, I see her on 1/30 and will discuss. If she is here could you come down and schedule tomorrow

## 2024-04-25 NOTE — Telephone Encounter (Signed)
 Pt cancelled appt twice and has not returned call to get rescheduled for a third time.

## 2024-04-25 NOTE — Telephone Encounter (Signed)
 PCC's, pt was supposed to complete a CPAP titration study per the lov note. This has not been scheduled yet and pt is suppose to f/u with Jennifer Ferrari, NP for this tomorrow. Do we know why this wasn't scheduled with pt?   Beth- do we go ahead and cancel tomorrow's appt?

## 2024-04-25 NOTE — Telephone Encounter (Signed)
 Ill be seeing her at 60 tomorrow, i'll message you  Thanks

## 2024-04-25 NOTE — Telephone Encounter (Signed)
" °  H&V Care Navigation CSW Progress Note  Clinical Social Worker received call from pt to inquire about transportation benefits.  CSW confirms she has no transportation benefits through her plan and does not have full Medicaid so unable to get rides that way either.  She has been paying for lyft to get to and from appts but this ends up being $20 or so every trip which adds up.  CSW mailing her [employer] application to assist with getting where she needs for a more reasonable price.  Will plan to assist her in getting to next clinic appt with us  as she will not be enrolled by [employer] at that time.  Andriette HILARIO Leech, LCSW Clinical Social Worker [facility] Desk#: [phone] Cell#: [phone]          "

## 2024-04-26 ENCOUNTER — Telehealth (HOSPITAL_COMMUNITY): Payer: Self-pay

## 2024-04-26 ENCOUNTER — Telehealth: Payer: Self-pay

## 2024-04-26 ENCOUNTER — Telehealth: Payer: Self-pay | Admitting: Primary Care

## 2024-04-26 ENCOUNTER — Telehealth: Admitting: Primary Care

## 2024-04-26 ENCOUNTER — Telehealth: Payer: Self-pay | Admitting: Family

## 2024-04-26 DIAGNOSIS — G4733 Obstructive sleep apnea (adult) (pediatric): Secondary | ICD-10-CM | POA: Diagnosis not present

## 2024-04-26 DIAGNOSIS — I5020 Unspecified systolic (congestive) heart failure: Secondary | ICD-10-CM

## 2024-04-26 DIAGNOSIS — G4730 Sleep apnea, unspecified: Secondary | ICD-10-CM

## 2024-04-26 NOTE — Telephone Encounter (Signed)
 Patient needs recall for 3 months with Landry NP for OSA

## 2024-04-26 NOTE — Telephone Encounter (Signed)
" °  Cardiomems Remote Monitoring  S/P Cardiomems Implant 12/06/21  PAD Goal: 24 Most recent reading: 31 indicating fluid accumulation  Recommended changes: Relayed medication adjustment to K. Lynch paramedic who will contact patient.   Take metolazone  2.5mg  daily X additional 3 days. Monitor fluid / sodium intake carefully. BMET next week.    I continue to review and analyze the patients PA pressures weekly (and more often as needed) to bring PA pressures within the optimal range.    [name] DELENA Class FNP-C 04/26/24         "

## 2024-04-26 NOTE — Progress Notes (Signed)
 Virtual Visit via Video Note  I connected with Jennifer [name] on 04/26/24 at 11:00 AM EST by a video enabled telemedicine application and verified that I am speaking with the correct person using two identifiers.  Location: Patient: Home Provider: Office   I discussed the limitations of evaluation and management by telemedicine and the availability of in person appointments. The patient expressed understanding and agreed to proceed.  History of Present Illness: 34 year old female, never smoked.  Past medical history significant for congestive heart failure, hypertension, pulmonary hypertension, cardial infarction, hepatic steatosis, gastroparesis, type 1 diabetes, hypothyroidism, kidney disease.  Previous [facility] pulmonary: 05/04/2023 Discussed the use of AI scribe software for clinical note transcription with the patient, who gave verbal consent to proceed.  History of Present Illness   Jennifer [name] is a 34 year old female who presents with difficulty sleeping. She was referred by Joesph Cedar, her primary care doctor, for evaluation of sleep disturbances.  She has been experiencing persistent difficulty sleeping, characterized by trouble both falling and staying asleep. Her typical bedtime is between 10:00 and 10:30 PM, but it takes her 30 to 40 minutes to fall asleep. She wakes up 30 to 40 times a night, often gasping for air. No snoring, but if she starts snoring, she usually wakes up. Her weight has been increasing over the past two years without a clear explanation, which she suspects might be related to fluid retention.  There is a concern for sleep apnea due to episodes of oxygen desaturation observed during hospital stays, where she wakes up gasping for air and dreams of being underwater. However, multiple home sleep studies, including the most recent one in October 2023, have returned negative results for sleep apnea. She has not undergone an in-lab sleep study.  She has a  history of hypertension, with recent blood pressure readings elevated at 160/90-100 mmHg. She experiences symptoms such as hot flashes and headaches when her blood pressure is high. She monitors her blood pressure at home and is managed by her cardiologist.    11/01/2023 Discussed the use of AI scribe software for clinical note transcription with the patient, who gave verbal consent to proceed.  History of Present Illness Jennifer [name] is a 34 year old female with moderate to severe sleep apnea who presents with difficulty tolerating CPAP therapy. She was referred by Joesph Cedar for sleep disturbances.  She experiences difficulty with CPAP therapy, describing it as 'awful' and anxiety-inducing. CPAP usage was inconsistent, with six to seven days recorded in May, but she was hospitalized for most of that month. In June and July, she used the machine but often removed it during sleep due to discomfort.  A sleep study on April 4th showed 28 apneic events per hour, snoring, and oxygen desaturations to 75%, with an average oxygen level of 91%.  The nasal CPAP mask causes discomfort as she feels the air going down her throat, which 'freaks her out'. She has a fear of masks, possibly related to claustrophobia since childhood. She alternates between a nasal pillow and a full mask, finding the latter more effective despite discomfort.  She takes Lyrica  at bedtime for general neuropathy, which does not usually cause grogginess or drowsiness. No use of sedatives, pain medications, or sleep aids.  Airview download 08/02/23-10/30/23 Usage days 12/90 days (13%) Average usage days used 4 hours 1 minutes Pressure 5-15cm h20  Airleaks 84.3L/min (95%) AHI 4.3    02/05/2024 Discussed the use of AI scribe software for clinical note transcription  with the patient, who gave verbal consent to proceed.  History of Present Illness Jennifer [name] is a 34 year old female with moderate obstructive sleep  apnea who presents for follow-up regarding CPAP intolerance.  She was diagnosed with moderate obstructive sleep apnea via a sleep study in April 2025. She experiences difficulty tolerating CPAP therapy, despite attempts to adjust the settings, including lowering the pressure to ten. She does not use the CPAP consistently due to discomfort, particularly with the mask and the sensation of pressure 'going down her throat'.  There has been no noticeable improvement in her symptoms with CPAP use, as she continues to wake up tired. No recent episodes of snoring, gasping, or choking during sleep, although these symptoms were present in the past. No recent weight loss has been noted.  [name] currently taking torsemide , a diuretic, and is scheduled to see her cardiologist soon. She does not consume alcohol  and avoids drinking before bed.   1. Sleep apnea, unspecified type (Primary)  Assessment and Plan Assessment & Plan Obstructive sleep apnea Moderate obstructive sleep apnea diagnosed via sleep study in April 2025. CPAP therapy not tolerated due to discomfort with mask and pressure settings. No perceived benefit from CPAP use. No recent snoring, gasping, or choking reported. BMI less than 40, making her a candidate for Inspire therapy if CPAP remains intolerable. - Ordered CPAP titration study at [facility] to adjust pressure settings and explore mask options. - Provided information on Inspire therapy as a potential future option if CPAP remains intolerable. Explained that patient would needs DISE procedure and referral to ENT.  - Advised against alcohol  consumption before bedtime. - Recommended sleeping on her side and avoiding sleeping flat on her back. - Scheduled follow-up in 8 to 12 weeks to assess CPAP titration study results and comfort with new settings.   04/26/2024- Interim hx  Discussed the use of AI scribe software for clinical note transcription with the patient, who gave verbal consent  to proceed.  History of Present Illness Jennifer [name] is a 34 year old female with moderate obstructive sleep apnea who presents for follow-up regarding her condition.  She experiences significant exhaustion despite sleeping, with episodes of falling asleep while walking. A sleep study in April revealed moderate obstructive sleep apnea with 28 apneic events per hour, and oxygen saturation levels dropping to 75%, averaging 91%.  She previously attempted CPAP therapy but found it intolerable due to discomfort with the mask and pressure settings, resulting in a usage rate of only 13%. She did not perceive any benefit from the CPAP therapy.  Her BMI is less than 40.  She does not consume alcohol .  Observations/Objective:  Appears well without overt respiratory symptoms  Assessment and Plan:  Assessment and Plan Assessment & Plan Moderate obstructive sleep apnea PSG in April 2025 showed moderate OSA, AHI 28 apneic events per hour and oxygen desaturation to 75%. CPAP therapy was not tolerated due to discomfort with mask and pressure settings, with usage at 13%. She did not complete CPAP titration study. CPAP machine was confiscated by DME. Symptoms include excessive daytime sleepiness. Reviewed risks of untreated sleep apnea including cardiac arrhythmia, stroke, hypertension, and diabetes. BMI is less than 40, meeting criteria for Inspire therapy. - Referred to Dr. Tobie, ENT in [place], for evaluation and discussion of Inspire therapy and DISE procedure - Advised to avoid sleeping on back and use side sleeping position or wedge pillow. - Advised to avoid alcohol  before bed. - Advised not  to drive if tired; opt for someone else to drive if sleep is inadequate. - Scheduled recall in 3 months to monitor progress and ensure no lapse in care.  Recording duration: 11 minutes  Follow Up Instructions:    I discussed the assessment and treatment plan with the patient. The patient  was provided an opportunity to ask questions and all were answered. The patient agreed with the plan and demonstrated an understanding of the instructions.   The patient was advised to call back or seek an in-person evaluation if the symptoms worsen or if the condition fails to improve as anticipated.  I provided 22 minutes of non-face-to-face time during this encounter.   Almarie LELON Ferrari, NP

## 2024-04-26 NOTE — Telephone Encounter (Signed)
 Orders placed per tina hackney

## 2024-04-26 NOTE — Telephone Encounter (Signed)
 Per [name] her cardiomems is elevated.  Med changes:  Take metolazone  2.5mg  daily X additional 3 days. Monitor fluid / sodium intake carefully. BMET next week.    This was relayed to patient and she understands.  Also sent her info regarding her lab f/u next week.    Izetta Quivers, EMT-Paramedic  [phone] 04/26/2024

## 2024-04-30 NOTE — Telephone Encounter (Signed)
 Apt scheduled.

## 2024-05-01 ENCOUNTER — Other Ambulatory Visit (HOSPITAL_COMMUNITY): Payer: Self-pay

## 2024-05-01 NOTE — Progress Notes (Signed)
 Paramedicine Encounter    Patient ID: Jennifer [name], female    DOB: 1991-03-25, 34 y.o.   MRN: [id number]   Complaints-denies  Edema-none   Compliance with meds-yes-just took am dose of meds now-1030-woke up late   Pill box filled-yes If so, by whom-paramedic   Refills needed- Did not bring out meds that were ordered  -pill box missing-atorvastatin  -asked pharmacy to bring that out this week.    Pt reports she has been feeling good.  Last week her cardiomems was elevated and she was suppose to take metolazone  for 3 days but she only took it for 1 day. She said when she takes a lot of extra diuretics it doesn't make her urinate as much, she  felt better after the 1st metolazone  but she got where she couldn't pee anymore so she didn't take the next 2 days.  She has been using her cardiomems.  She has labs on Friday.  Pt denies increased sob, no dizziness, no c/p.  Her weight is normal range today.  She reports after her 1st dose of metolazone  she felt better and improved and could tell a difference and she feels same today.   Meds verified and pill box refilled.   Will get back on track for Monday visits next week.  She has labs on Friday this week.    Per pharm- Atorvastatin  needs new rx and her corlanor  is coming up over $200-sent message to pharm team at clinic to see if maybe a grant can cover this and if she is eligible.    B/p elevated- just took meds during visit    BP (!) 160/80   Pulse 96   Resp 16   Wt 153 lb (69.4 kg)   SpO2 97%   BMI 33.11 kg/m  Weight yesterday-153 Last visit weight-156  Patient Care Team: [name] as PCP - General (Physician Assistant) Tommas Pears, MD as PCP - Endocrinology (Endocrinology) Ladona Heinz, MD as PCP - Cardiology (Cardiology)  Patient Active Problem List   Diagnosis Date Noted   Drug-seeking behavior 03/30/2024   Pure hypercholesterolemia 02/24/2024   CKD (chronic kidney disease) stage 4, GFR  15-29 ml/min (HCC) - baseline scr 2.8-3.0 02/23/2024   Hyperglycemia due to diabetes mellitus (HCC) 02/23/2024   NICM (nonischemic cardiomyopathy) (HCC) 02/23/2024   Skin infection 01/02/2024   Controlled type 2 diabetes mellitus with hyperglycemia (HCC) 08/31/2023   Sleep apnea 08/30/2023   Chronic pancreatitis (HCC) 08/15/2023   Peripheral neuropathy 04/10/2023   Coronary artery disease due to lipid rich plaque 01/18/2023   Elevated liver enzymes 01/02/2023   Bladder wall thickening 01/02/2023   Hyperglycemia in setting of T2DM 09/19/2022   ASCUS of cervix with negative high risk HPV 05/31/2022   Pulmonary hypertension (HCC) 02/06/2022   NASH (nonalcoholic steatohepatitis) 02/06/2022   Obesity, Class I, BMI 30-34.9 02/06/2022   Acute on chronic combined systolic and diastolic CHF (congestive heart failure) (HCC) 12/02/2021   Pancytopenia (HCC) 12/02/2021   Amenorrhea 12/02/2021   Hepatic steatosis 09/27/2021   Hx of insulin  dependent diabetes mellitus 08/14/2021   Gastroparesis due to DM (HCC) 05/12/2021   History of astrocytoma of brain 02/09/2021   Type 2 diabetes mellitus with hyperglycemia, with long-term current use of insulin  (HCC) 01/25/2021   Elevated transaminase level    Orthostatic hypotension 11/28/2020   Acute combined systolic and diastolic heart failure (HCC)    Nonischemic cardiomyopathy (HCC)    Chronic systolic congestive heart failure (HCC) 11/23/2020  Primary hypertension 11/23/2020   Familial hypertriglyceridemia 11/23/2020   Prolonged QT interval 11/23/2020   Polycystic ovary syndrome 01/30/2020   Moderate aortic insufficiency 08/16/2019   Moderate mitral regurgitation 08/16/2019   Lipoprotein lipase deficiency, familial 06/19/2019   Microalbuminuria due to type 1 diabetes mellitus (HCC) 06/19/2019   Type 2 diabetes mellitus with hyperlipidemia (HCC) 10/10/2018   Hypothyroidism 10/10/2018   Current Medications[1] Allergies[2]    Social History    Socioeconomic History   Marital status: Divorced    Spouse name: Not on file   Number of children: 0   Years of education: Not on file   Highest education level: Not on file  Occupational History   Occupation: Dance Movement Psychotherapist   Occupation: N/A  Tobacco Use   Smoking status: Never   Smokeless tobacco: Never  Vaping Use   Vaping status: Never Used  Substance and Sexual Activity   Alcohol  use: Never   Drug use: Never   Sexual activity: Yes  Other Topics Concern   Not on file  Social History Narrative   Not on file   Social Drivers of Health   Tobacco Use: Low Risk (04/26/2024)   Received from [facility]   Patient History    Smoking Tobacco Use: Never    Smokeless Tobacco Use: Never    Passive Exposure: Never  Financial Resource Strain: Medium Risk (04/19/2024)   Received from [facility]   Overall Financial Resource Strain (CARDIA)    How hard is it for you to pay for the very basics like food, housing, medical care, and heating?: Somewhat hard  Food Insecurity: No Food Insecurity (04/19/2024)   Received from [facility]   Epic    Within the past 12 months, you worried that your food would run out before you got the money to buy more.: Never true    Within the past 12 months, the food you bought just didn't last and you didn't have money to get more.: Never true  Recent Concern: Food Insecurity - Food Insecurity Present (01/22/2024)   Received from [facility]   Epic    Within the past 12 months, you worried that your food would run out before you got the money to buy more.: Sometimes true    Within the past 12 months, the food you bought just didn't last and you didn't have money to get more.: Sometimes true  Transportation Needs: No Transportation Needs (04/25/2024)   Epic    Lack of Transportation (Medical): No    Lack of Transportation (Non-Medical): No  Physical Activity: Inactive (01/22/2024)   Received from [facility]   Exercise Vital Sign    On  average, how many days per week do you engage in moderate to strenuous exercise (like a brisk walk)?: 0 days    Minutes of Exercise per Session: Not on file  Stress: No Stress Concern Present (01/22/2024)   Received from [facility] - Occupational Stress Questionnaire    Do you feel stress - tense, restless, nervous, or anxious, or unable to sleep at night because your mind is troubled all the time - these days?: Not at all  Social Connections: Socially Integrated (01/22/2024)   Received from [facility]   Social Network    How would you rate your social network (family, work, friends)?: Good participation with social networks  Intimate Partner Violence: Not At Risk (03/30/2024)   Epic    Fear of Current or Ex-Partner: No  Emotionally Abused: No    Physically Abused: No    Sexually Abused: No  Depression (PHQ2-9): Low Risk (12/10/2021)   Depression (PHQ2-9)    PHQ-2 Score: 0  Alcohol  Screen: Low Risk (12/10/2021)   Alcohol  Screen    Last Alcohol  Screening Score (AUDIT): 0  Housing: Low Risk (04/19/2024)   Received from [facility]    In the last 12 months, was there a time when you were not able to pay the mortgage or rent on time?: No    In the past 12 months, how many times have you moved where you were living?: 0    At any time in the past 12 months, were you homeless or living in a shelter (including now)?: No  Utilities: Not At Risk (04/19/2024)   Received from [facility]    In the past 12 months has the electric, gas, oil, or water  company threatened to shut off services in your home?: No  Health Literacy: Not on file    Physical Exam      Future Appointments  Date Time Provider Department Center  05/03/2024 11:00 AM [facility] LAB [facility] None  05/07/2024  3:15 PM Alm Jennifer SAILOR, DO [facility] None  05/08/2024  1:30 PM [facility] PA/NP [facility] None  05/29/2024  8:15 AM Tobie Eldora NOVAK, MD [facility] None  07/05/2024   8:40 AM Mona Vinie BROCKS, MD [facility] H&V  07/29/2024  9:30 AM Hope Almarie ORN, NP [facility] [id number]       Izetta Quivers, Paramedic [phone] [facility] Paramedic  05/01/24     [1]  Current Outpatient Medications:    albuterol  (VENTOLIN  HFA) 108 (90 Base) MCG/ACT inhaler, Inhale 2 puffs into the lungs daily as needed for wheezing or shortness of breath., Disp: , Rfl:    allopurinol  (ZYLOPRIM ) 100 MG tablet, Take 1 tablet (100 mg total) by mouth daily., Disp: 90 tablet, Rfl: 3   aspirin  EC 81 MG tablet, Take 1 tablet (81 mg total) by mouth daily. Swallow whole., Disp: 30 tablet, Rfl: 1   atorvastatin  (LIPITOR ) 80 MG tablet, Take 1 tablet (80 mg total) by mouth every evening., Disp: 30 tablet, Rfl: 0   Biotin 10000 MCG TABS, Take 1 tablet by mouth daily., Disp: , Rfl:    calcitRIOL  (ROCALTROL ) 0.25 MCG capsule, Take 0.25 mcg by mouth daily., Disp: , Rfl:    doxazosin  (CARDURA ) 4 MG tablet, Take 2 tablets (8 mg total) by mouth daily., Disp: 60 tablet, Rfl: 5   DULoxetine  (CYMBALTA ) 60 MG capsule, Take 60 mg by mouth at bedtime., Disp: , Rfl:    hydrALAZINE  (APRESOLINE ) 100 MG tablet, Take 1 tablet (100 mg total) by mouth every 8 (eight) hours., Disp: 90 tablet, Rfl: 3   isosorbide  mononitrate (IMDUR ) 60 MG 24 hr tablet, Take 1 tablet (60 mg total) by mouth daily., Disp: 30 tablet, Rfl: 3   ivabradine  (CORLANOR ) 7.5 MG TABS tablet, Take 1 tablet (7.5 mg total) by mouth 2 (two) times daily with a meal., Disp: 60 tablet, Rfl: 11   lansoprazole  (PREVACID ) 30 MG capsule, TAKE 1 CAPSULE (30 MG TOTAL) BY MOUTH 2 (TWO) TIMES DAILY BEFORE A MEAL., Disp: 60 capsule, Rfl: 2   levocetirizine (XYZAL ) 5 MG tablet, Take 5 mg by mouth at bedtime., Disp: , Rfl:    levothyroxine  (SYNTHROID ) 150 MCG tablet, Take 150 mcg by mouth daily before breakfast., Disp: , Rfl:    NIFEdipine  (PROCARDIA -XL/NIFEDICAL-XL) 30 MG 24 hr tablet,  Take 1 tablet (30 mg total) by mouth daily., Disp: 30 tablet,  Rfl: 11   pregabalin  (LYRICA ) 300 MG capsule, Take 300 mg by mouth at bedtime., Disp: , Rfl:    promethazine  (PHENERGAN ) 25 MG tablet, Take 25 mg by mouth at bedtime., Disp: , Rfl:    Safety Seal Miscellaneous MISC, Hormonic Hair Solution with minoxidil USP 7% and finasteride USP 0.05% - apply to affected areas daily every morning., Disp: 30 mL, Rfl: 2   torsemide  (DEMADEX ) 20 MG tablet, Take 3 tablets (60 mg total) by mouth daily., Disp: 90 tablet, Rfl: 5   amitriptyline  (ELAVIL ) 10 MG tablet, Take 10 mg by mouth. (Patient taking differently: Take 10 mg by mouth at bedtime.), Disp: , Rfl:    azelastine  (ASTELIN ) 0.1 % nasal spray, Place 2 sprays into both nostrils daily as needed for rhinitis or allergies., Disp: , Rfl:    BAQSIMI ONE PACK 3 MG/DOSE POWD, Place 1 Dose into the nose once as needed (hypoglycemia)., Disp: , Rfl:    cephALEXin  (KEFLEX ) 500 MG capsule, Take 1 capsule (500 mg total) by mouth 2 (two) times daily for 5 days., Disp: 10 capsule, Rfl: 0   dicyclomine  (BENTYL ) 20 MG tablet, Take 1 tablet (20 mg total) by mouth 2 (two) times daily., Disp: 20 tablet, Rfl: 0   ergocalciferol  (VITAMIN D2) 1.25 MG (50000 UT) capsule, Take 1 capsule (50,000 Units total) by mouth every Sunday. (Patient not taking: Reported on 04/24/2024), Disp: 4 capsule, Rfl: 0   Glucose Blood (BLOOD GLUCOSE TEST STRIPS) STRP, 1 each by Does not apply route 3 (three) times daily. Use as directed to check blood sugar. May dispense any manufacturer covered by patient's insurance and fits patient's device., Disp: 100 strip, Rfl: 0   HUMALOG  KWIKPEN 100 UNIT/ML KwikPen, Inject 60 Units into the skin in the morning, at noon, and at bedtime. (Patient taking differently: Inject 60 Units into the skin in the morning, at noon, and at bedtime.), Disp: , Rfl:    HUMULIN  R U-500 KWIKPEN 500 UNIT/ML KwikPen, Inject 300 Units into the skin in the morning and at bedtime. (Patient taking differently: Inject 150 Units into the skin in  the morning and at bedtime.), Disp: , Rfl:    Insulin  Pen Needle (PEN NEEDLES) 31G X 5 MM MISC, Use as directed 3 (three) times daily., Disp: 100 each, Rfl: 0   Lancet Device MISC, 1 each by Does not apply route 3 (three) times daily. May dispense any manufacturer covered by patient's insurance., Disp: 1 each, Rfl: 0   Lancets MISC, 1 each by Does not apply route 3 (three) times daily. Use as directed to check blood sugar. May dispense any manufacturer covered by patient's insurance and fits patient's device., Disp: 100 each, Rfl: 0   metolazone  (ZAROXOLYN ) 2.5 MG tablet, TAKE 1 TABLET TODAY 1/9, AND 1 TABLET TOMORROW 1/10-THEN ONLY AS DIRECTED BY [facility] CLINIC, Disp: 6 tablet, Rfl: 0   Olezarsen Sodium (TRYNGOLZA) 80 MG/0.8ML SOAJ, Inject 1 Units into the skin every 30 (thirty) days., Disp: , Rfl:    SUMAtriptan  (IMITREX ) 100 MG tablet, Take 100 mg by mouth daily as needed., Disp: , Rfl:    tretinoin  (RETIN-A ) 0.05 % cream, Apply 1 Application topically at bedtime., Disp: , Rfl:  [2]  Allergies Allergen Reactions   Icosapent  Ethyl (Epa Ethyl Ester) (Fish) Hives   Ketamine Other (See Comments)    In a vegetative state for 15 minutes, per pt   Maitake Itching and Other (  See Comments)    Itchy throat   Morphine  Hives, Itching, Rash and Other (See Comments)    Sore throat, too   Mushroom Other (See Comments)    Pt has never had mushrooms but tested positive on allergy test   Penicillins Hives, Itching and Rash    Tolerates cephalosporins   Shellfish Allergy Other (See Comments)    Pt has never had shellfish, but tested positive on allergy test. Pt states contrast in CT is okay.   Fentanyl  Nausea And Vomiting   Prednisone Rash

## 2024-05-02 ENCOUNTER — Telehealth (HOSPITAL_COMMUNITY): Payer: Self-pay | Admitting: Licensed Clinical Social Worker

## 2024-05-02 ENCOUNTER — Other Ambulatory Visit (HOSPITAL_COMMUNITY): Payer: Self-pay

## 2024-05-02 MED ORDER — ATORVASTATIN CALCIUM 80 MG PO TABS: 80.0000 mg | ORAL_TABLET | Freq: Every evening | ORAL | 0 refills | Status: AC

## 2024-05-02 NOTE — Telephone Encounter (Signed)
 H&V Care Navigation CSW Progress Note  Clinical Social Worker received call from pt requesting help with ride to tomorrows lab- able to arrange [employer] taxi 10:30am pick up from [facility]  Will continue to follow and assist as needed  Nadyne Gariepy H. Melane Windholz, LCSW Clinical Social Worker [facility] Desk#: [phone] Cell#: [phone]

## 2024-05-03 ENCOUNTER — Ambulatory Visit (HOSPITAL_COMMUNITY): Admission: RE | Admit: 2024-05-03

## 2024-05-03 DIAGNOSIS — I5020 Unspecified systolic (congestive) heart failure: Secondary | ICD-10-CM

## 2024-05-03 LAB — BASIC METABOLIC PANEL WITH GFR
Anion gap: 15 (ref 5–15)
BUN: 75 mg/dL — ABNORMAL HIGH (ref 6–20)
CO2: 27 mmol/L (ref 22–32)
Calcium: 8.9 mg/dL (ref 8.9–10.3)
Chloride: 99 mmol/L (ref 98–111)
Creatinine, Ser: 2.45 mg/dL — ABNORMAL HIGH (ref 0.44–1.00)
GFR, Estimated: 26 mL/min — ABNORMAL LOW
Glucose, Bld: 94 mg/dL (ref 70–99)
Potassium: 4.3 mmol/L (ref 3.5–5.1)
Sodium: 141 mmol/L (ref 135–145)

## 2024-05-07 ENCOUNTER — Ambulatory Visit: Admitting: Dermatology

## 2024-05-08 ENCOUNTER — Ambulatory Visit (HOSPITAL_COMMUNITY)

## 2024-05-29 ENCOUNTER — Institutional Professional Consult (permissible substitution) (INDEPENDENT_AMBULATORY_CARE_PROVIDER_SITE_OTHER): Admitting: Otolaryngology

## 2024-07-05 ENCOUNTER — Ambulatory Visit: Admitting: Internal Medicine

## 2024-07-29 ENCOUNTER — Ambulatory Visit: Admitting: Primary Care
